# Patient Record
Sex: Female | Born: 1955 | Race: White | Hispanic: No | Smoking: Former smoker
Health system: Southern US, Community
[De-identification: ages and names within clinical notes are randomized; demographics above are authoritative.]

## PROBLEM LIST (undated history)

## (undated) DIAGNOSIS — K219 Gastro-esophageal reflux disease without esophagitis: Secondary | ICD-10-CM

## (undated) DIAGNOSIS — R011 Cardiac murmur, unspecified: Secondary | ICD-10-CM

## (undated) DIAGNOSIS — I509 Heart failure, unspecified: Secondary | ICD-10-CM

## (undated) DIAGNOSIS — K760 Fatty (change of) liver, not elsewhere classified: Secondary | ICD-10-CM

## (undated) DIAGNOSIS — D509 Iron deficiency anemia, unspecified: Secondary | ICD-10-CM

## (undated) DIAGNOSIS — D649 Anemia, unspecified: Secondary | ICD-10-CM

## (undated) DIAGNOSIS — I350 Nonrheumatic aortic (valve) stenosis: Secondary | ICD-10-CM

## (undated) DIAGNOSIS — Z86018 Personal history of other benign neoplasm: Secondary | ICD-10-CM

## (undated) DIAGNOSIS — M109 Gout, unspecified: Secondary | ICD-10-CM

## (undated) DIAGNOSIS — C801 Malignant (primary) neoplasm, unspecified: Secondary | ICD-10-CM

## (undated) DIAGNOSIS — F32A Depression, unspecified: Secondary | ICD-10-CM

## (undated) DIAGNOSIS — R296 Repeated falls: Secondary | ICD-10-CM

## (undated) DIAGNOSIS — Z9221 Personal history of antineoplastic chemotherapy: Secondary | ICD-10-CM

## (undated) DIAGNOSIS — R42 Dizziness and giddiness: Secondary | ICD-10-CM

## (undated) DIAGNOSIS — N281 Cyst of kidney, acquired: Secondary | ICD-10-CM

## (undated) DIAGNOSIS — E119 Type 2 diabetes mellitus without complications: Secondary | ICD-10-CM

## (undated) DIAGNOSIS — IMO0002 Reserved for concepts with insufficient information to code with codable children: Secondary | ICD-10-CM

## (undated) DIAGNOSIS — M1990 Unspecified osteoarthritis, unspecified site: Secondary | ICD-10-CM

## (undated) DIAGNOSIS — Z9289 Personal history of other medical treatment: Secondary | ICD-10-CM

## (undated) DIAGNOSIS — Q231 Congenital insufficiency of aortic valve: Secondary | ICD-10-CM

## (undated) DIAGNOSIS — Q2381 Bicuspid aortic valve: Secondary | ICD-10-CM

## (undated) DIAGNOSIS — N183 Chronic kidney disease, stage 3 unspecified: Secondary | ICD-10-CM

## (undated) DIAGNOSIS — I5032 Chronic diastolic (congestive) heart failure: Secondary | ICD-10-CM

## (undated) DIAGNOSIS — I10 Essential (primary) hypertension: Secondary | ICD-10-CM

## (undated) DIAGNOSIS — R9439 Abnormal result of other cardiovascular function study: Secondary | ICD-10-CM

## (undated) DIAGNOSIS — C189 Malignant neoplasm of colon, unspecified: Secondary | ICD-10-CM

## (undated) DIAGNOSIS — I471 Supraventricular tachycardia, unspecified: Secondary | ICD-10-CM

## (undated) DIAGNOSIS — C9000 Multiple myeloma not having achieved remission: Secondary | ICD-10-CM

## (undated) DIAGNOSIS — F419 Anxiety disorder, unspecified: Secondary | ICD-10-CM

## (undated) DIAGNOSIS — Z9481 Bone marrow transplant status: Secondary | ICD-10-CM

## (undated) DIAGNOSIS — I493 Ventricular premature depolarization: Secondary | ICD-10-CM

## (undated) DIAGNOSIS — Z9889 Other specified postprocedural states: Secondary | ICD-10-CM

## (undated) DIAGNOSIS — F329 Major depressive disorder, single episode, unspecified: Secondary | ICD-10-CM

## (undated) HISTORY — DX: Malignant (primary) neoplasm, unspecified: C80.1

## (undated) HISTORY — DX: Dizziness and giddiness: R42

## (undated) HISTORY — DX: Depression, unspecified: F32.A

## (undated) HISTORY — DX: Personal history of other medical treatment: Z92.89

## (undated) HISTORY — DX: Heart failure, unspecified: I50.9

## (undated) HISTORY — DX: Multiple myeloma not having achieved remission: C90.00

## (undated) HISTORY — DX: Fatty (change of) liver, not elsewhere classified: K76.0

## (undated) HISTORY — DX: Cardiac murmur, unspecified: R01.1

## (undated) HISTORY — DX: Repeated falls: R29.6

## (undated) HISTORY — DX: Anxiety disorder, unspecified: F41.9

## (undated) HISTORY — DX: Abnormal result of other cardiovascular function study: R94.39

## (undated) HISTORY — PX: CARPAL TUNNEL RELEASE: SHX101

## (undated) HISTORY — DX: Cyst of kidney, acquired: N28.1

## (undated) HISTORY — DX: Gout, unspecified: M10.9

## (undated) HISTORY — DX: Personal history of other benign neoplasm: Z86.018

## (undated) HISTORY — PX: CARDIAC ELECTROPHYSIOLOGY MAPPING AND ABLATION: SHX1292

## (undated) HISTORY — DX: Chronic kidney disease, stage 3 (moderate): N18.3

## (undated) HISTORY — DX: Congenital insufficiency of aortic valve: Q23.1

## (undated) HISTORY — DX: Malignant neoplasm of colon, unspecified: C18.9

## (undated) HISTORY — DX: Anemia, unspecified: D64.9

## (undated) HISTORY — PX: FOOT SURGERY: SHX648

## (undated) HISTORY — DX: Hypomagnesemia: E83.42

## (undated) HISTORY — DX: Bicuspid aortic valve: Q23.81

## (undated) HISTORY — DX: Bone marrow transplant status: Z94.81

## (undated) HISTORY — DX: Gastro-esophageal reflux disease without esophagitis: K21.9

## (undated) HISTORY — DX: Type 2 diabetes mellitus without complications: E11.9

## (undated) HISTORY — PX: ABDOMINAL HYSTERECTOMY: SHX81

## (undated) HISTORY — PX: INTERSTIM IMPLANT PLACEMENT: SHX5130

## (undated) HISTORY — DX: Major depressive disorder, single episode, unspecified: F32.9

## (undated) HISTORY — DX: Unspecified osteoarthritis, unspecified site: M19.90

## (undated) HISTORY — DX: Chronic kidney disease, stage 3 unspecified: N18.30

## (undated) HISTORY — DX: Reserved for concepts with insufficient information to code with codable children: IMO0002

## (undated) HISTORY — PX: OTHER SURGICAL HISTORY: SHX169

## (undated) MED FILL — Promethazine HCl Inj 25 MG/ML: INTRAMUSCULAR | Qty: 0.5 | Status: AC

---
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
## 1996-03-31 HISTORY — PX: PARTIAL HYSTERECTOMY: SHX80

## 2005-07-01 HISTORY — PX: TONSILLECTOMY: SUR1361

## 2006-07-01 HISTORY — PX: CHOLECYSTECTOMY: SHX55

## 2007-07-02 HISTORY — PX: INCONTINENCE SURGERY: SHX676

## 2013-01-14 DIAGNOSIS — R42 Dizziness and giddiness: Secondary | ICD-10-CM | POA: Insufficient documentation

## 2013-02-14 DIAGNOSIS — K529 Noninfective gastroenteritis and colitis, unspecified: Secondary | ICD-10-CM | POA: Insufficient documentation

## 2015-05-16 DIAGNOSIS — Z9484 Stem cells transplant status: Secondary | ICD-10-CM | POA: Insufficient documentation

## 2015-05-16 DIAGNOSIS — Z7682 Awaiting organ transplant status: Secondary | ICD-10-CM | POA: Insufficient documentation

## 2015-06-01 HISTORY — PX: OTHER SURGICAL HISTORY: SHX169

## 2015-07-06 DIAGNOSIS — Z9484 Stem cells transplant status: Secondary | ICD-10-CM | POA: Insufficient documentation

## 2016-02-02 DIAGNOSIS — R0602 Shortness of breath: Secondary | ICD-10-CM | POA: Insufficient documentation

## 2016-02-14 DIAGNOSIS — R9439 Abnormal result of other cardiovascular function study: Secondary | ICD-10-CM | POA: Insufficient documentation

## 2016-02-14 DIAGNOSIS — R072 Precordial pain: Secondary | ICD-10-CM | POA: Insufficient documentation

## 2016-02-14 HISTORY — DX: Abnormal result of other cardiovascular function study: R94.39

## 2016-04-02 ENCOUNTER — Inpatient Hospital Stay: Payer: 59 | Attending: Internal Medicine | Admitting: Internal Medicine

## 2016-04-02 ENCOUNTER — Other Ambulatory Visit: Payer: Self-pay | Admitting: *Deleted

## 2016-04-02 ENCOUNTER — Ambulatory Visit: Payer: 59

## 2016-04-02 ENCOUNTER — Encounter: Payer: Self-pay | Admitting: Internal Medicine

## 2016-04-02 VITALS — BP 146/76 | HR 64 | Temp 98.6°F | Resp 18 | Ht 63.0 in | Wt 138.9 lb

## 2016-04-02 DIAGNOSIS — M549 Dorsalgia, unspecified: Secondary | ICD-10-CM | POA: Diagnosis not present

## 2016-04-02 DIAGNOSIS — Z9181 History of falling: Secondary | ICD-10-CM | POA: Insufficient documentation

## 2016-04-02 DIAGNOSIS — Z87891 Personal history of nicotine dependence: Secondary | ICD-10-CM | POA: Diagnosis not present

## 2016-04-02 DIAGNOSIS — E119 Type 2 diabetes mellitus without complications: Secondary | ICD-10-CM | POA: Diagnosis not present

## 2016-04-02 DIAGNOSIS — K521 Toxic gastroenteritis and colitis: Secondary | ICD-10-CM

## 2016-04-02 DIAGNOSIS — Z9481 Bone marrow transplant status: Secondary | ICD-10-CM

## 2016-04-02 DIAGNOSIS — R197 Diarrhea, unspecified: Secondary | ICD-10-CM | POA: Diagnosis not present

## 2016-04-02 DIAGNOSIS — Z72820 Sleep deprivation: Secondary | ICD-10-CM

## 2016-04-02 DIAGNOSIS — F329 Major depressive disorder, single episode, unspecified: Secondary | ICD-10-CM | POA: Diagnosis not present

## 2016-04-02 DIAGNOSIS — R011 Cardiac murmur, unspecified: Secondary | ICD-10-CM | POA: Insufficient documentation

## 2016-04-02 DIAGNOSIS — M109 Gout, unspecified: Secondary | ICD-10-CM | POA: Insufficient documentation

## 2016-04-02 DIAGNOSIS — Z7984 Long term (current) use of oral hypoglycemic drugs: Secondary | ICD-10-CM | POA: Diagnosis not present

## 2016-04-02 DIAGNOSIS — G8929 Other chronic pain: Secondary | ICD-10-CM

## 2016-04-02 DIAGNOSIS — M129 Arthropathy, unspecified: Secondary | ICD-10-CM | POA: Diagnosis not present

## 2016-04-02 DIAGNOSIS — C9001 Multiple myeloma in remission: Secondary | ICD-10-CM | POA: Diagnosis present

## 2016-04-02 DIAGNOSIS — K219 Gastro-esophageal reflux disease without esophagitis: Secondary | ICD-10-CM | POA: Diagnosis not present

## 2016-04-02 DIAGNOSIS — C9000 Multiple myeloma not having achieved remission: Secondary | ICD-10-CM | POA: Insufficient documentation

## 2016-04-02 DIAGNOSIS — Z800 Family history of malignant neoplasm of digestive organs: Secondary | ICD-10-CM

## 2016-04-02 DIAGNOSIS — F419 Anxiety disorder, unspecified: Secondary | ICD-10-CM | POA: Insufficient documentation

## 2016-04-02 DIAGNOSIS — Z808 Family history of malignant neoplasm of other organs or systems: Secondary | ICD-10-CM | POA: Insufficient documentation

## 2016-04-02 DIAGNOSIS — G4700 Insomnia, unspecified: Secondary | ICD-10-CM | POA: Diagnosis not present

## 2016-04-02 DIAGNOSIS — K760 Fatty (change of) liver, not elsewhere classified: Secondary | ICD-10-CM | POA: Insufficient documentation

## 2016-04-02 DIAGNOSIS — N281 Cyst of kidney, acquired: Secondary | ICD-10-CM | POA: Diagnosis not present

## 2016-04-02 DIAGNOSIS — Z79899 Other long term (current) drug therapy: Secondary | ICD-10-CM | POA: Diagnosis not present

## 2016-04-02 DIAGNOSIS — R42 Dizziness and giddiness: Secondary | ICD-10-CM | POA: Diagnosis not present

## 2016-04-02 DIAGNOSIS — R200 Anesthesia of skin: Secondary | ICD-10-CM | POA: Diagnosis not present

## 2016-04-02 DIAGNOSIS — I129 Hypertensive chronic kidney disease with stage 1 through stage 4 chronic kidney disease, or unspecified chronic kidney disease: Secondary | ICD-10-CM | POA: Insufficient documentation

## 2016-04-02 DIAGNOSIS — T451X5A Adverse effect of antineoplastic and immunosuppressive drugs, initial encounter: Secondary | ICD-10-CM

## 2016-04-02 DIAGNOSIS — Z803 Family history of malignant neoplasm of breast: Secondary | ICD-10-CM

## 2016-04-02 DIAGNOSIS — F32A Depression, unspecified: Secondary | ICD-10-CM

## 2016-04-02 DIAGNOSIS — N183 Chronic kidney disease, stage 3 (moderate): Secondary | ICD-10-CM | POA: Diagnosis not present

## 2016-04-02 LAB — CBC WITH DIFFERENTIAL/PLATELET
BASOS ABS: 0 10*3/uL (ref 0–0.1)
Basophils Relative: 0 %
Eosinophils Absolute: 0.1 10*3/uL (ref 0–0.7)
Eosinophils Relative: 2 %
HEMATOCRIT: 32 % — AB (ref 35.0–47.0)
Hemoglobin: 11.2 g/dL — ABNORMAL LOW (ref 12.0–16.0)
LYMPHS PCT: 25 %
Lymphs Abs: 1.3 10*3/uL (ref 1.0–3.6)
MCH: 31.9 pg (ref 26.0–34.0)
MCHC: 34.9 g/dL (ref 32.0–36.0)
MCV: 91.3 fL (ref 80.0–100.0)
MONO ABS: 0.3 10*3/uL (ref 0.2–0.9)
Monocytes Relative: 6 %
NEUTROS ABS: 3.6 10*3/uL (ref 1.4–6.5)
Neutrophils Relative %: 67 %
Platelets: 94 10*3/uL — ABNORMAL LOW (ref 150–440)
RBC: 3.5 MIL/uL — ABNORMAL LOW (ref 3.80–5.20)
RDW: 15.7 % — AB (ref 11.5–14.5)
WBC: 5.4 10*3/uL (ref 3.6–11.0)

## 2016-04-02 LAB — COMPREHENSIVE METABOLIC PANEL
ALK PHOS: 91 U/L (ref 38–126)
ALT: 26 U/L (ref 14–54)
AST: 30 U/L (ref 15–41)
Albumin: 4.7 g/dL (ref 3.5–5.0)
Anion gap: 9 (ref 5–15)
BILIRUBIN TOTAL: 0.6 mg/dL (ref 0.3–1.2)
BUN: 18 mg/dL (ref 6–20)
CALCIUM: 8.5 mg/dL — AB (ref 8.9–10.3)
CO2: 24 mmol/L (ref 22–32)
CREATININE: 0.91 mg/dL (ref 0.44–1.00)
Chloride: 107 mmol/L (ref 101–111)
GFR calc Af Amer: >60 mL/min (ref >60)
Glucose, Bld: 83 mg/dL (ref 65–99)
POTASSIUM: 3.6 mmol/L (ref 3.5–5.1)
Sodium: 140 mmol/L (ref 135–145)
TOTAL PROTEIN: 7.5 g/dL (ref 6.5–8.1)

## 2016-04-02 LAB — LACTATE DEHYDROGENASE: LDH: 165 U/L (ref 98–192)

## 2016-04-02 MED ORDER — GLIPIZIDE ER 2.5 MG PO TB24: 2.5000 mg | ORAL_TABLET | Freq: Every day | ORAL | 0 refills | Status: DC

## 2016-04-02 MED ORDER — ALPRAZOLAM 1 MG PO TABS: 1.0000 mg | ORAL_TABLET | Freq: Two times a day (BID) | ORAL | 0 refills | Status: DC | PRN

## 2016-04-02 MED ORDER — DIPHENOXYLATE-ATROPINE 2.5-0.025 MG PO TABS: 2.0000 | ORAL_TABLET | Freq: Four times a day (QID) | ORAL | 3 refills | Status: DC | PRN

## 2016-04-02 MED ORDER — REVLIMID 5 MG PO CAPS: 5.0000 mg | ORAL_CAPSULE | Freq: Every day | ORAL | 4 refills | Status: DC

## 2016-04-02 MED ORDER — HYDROCODONE-IBUPROFEN 7.5-200 MG PO TABS: 1.0000 | ORAL_TABLET | Freq: Three times a day (TID) | ORAL | 0 refills | Status: DC | PRN

## 2016-04-02 MED ORDER — ZOLPIDEM TARTRATE ER 12.5 MG PO TBCR: 12.5000 mg | EXTENDED_RELEASE_TABLET | Freq: Every day | ORAL | 1 refills | Status: DC

## 2016-04-02 NOTE — Progress Notes (Signed)
[facility] CONSULT NOTE  No care team member to display  CHIEF COMPLAINTS/PURPOSE OF CONSULTATION:  Multiple myeloma  # MULTIPLE MYELOMA-   Oncology History   # June 2017- IgALamda MULTIPLE MYELOMA [BMBx- 80% plasma cells; [facility]]; Lamda light chain 1340; s/p RVD   # 14th DEC 2016- Auto-Stem cell transplant [[facility];Dr.Hertzig]; rev [dose reduced sec to [place]  # Maintenance Revlimid- 83m 3 w-ON & 1 week OFF  # Bone lesions-numerous ~548mlytic lesions in Thoracic spine- on Zometa  # June 2016- Proteinuria [3gm/day]/ CKD III   # chronic back pain/ anxiety/ PN   # "Colitis" s/p multiple EGD/Colonoscopies     Multiple myeloma in remission ([facility]  04/02/2016 Initial Diagnosis    [place]oma in remission ([facility]      HISTORY OF PRESENTING ILLNESS:  ChMaisley Hainsworth89.o.  female of her history of multiple myeloma diagnosed June 2016 currently on maintenance Revlimid after bone marrow transplant is here to establish care.   Patient denies any unusual weight loss. Patient has chronic back pain multiple pain. This is not any worse. She has anxiety for which she is on Xanax.  She has chronic diarrhea for which she has been taking Lomotil as needed. This is attributed to Revlimid. Although she has chronic probable irritable bowel syndrome. Chronic tingling and numbness of extremities.  ROS: A complete 10 point review of system is done which is negative except mentioned above in history of present illness  MEDICAL HISTORY:  Past Medical History:  Diagnosis Date  . Anemia   . Anxiety   . Arthritis   . Bicuspid aortic valve   . CKD (chronic kidney disease) stage 3, GFR 30-59 ml/min   . Depression   . Diabetes mellitus ([facility]  . Dizziness   . Fatty liver   . Frequent falls   . GERD (gastroesophageal reflux disease)   . Gout   . Heart murmur   . History of blood transfusion   . History of bone marrow  transplant ([facility]  . History of uterine fibroid   . Hypomagnesemia   . Multiple myeloma ([facility]  . Renal cyst     SURGICAL HISTORY: Past Surgical History:  Procedure Laterality Date  . CARDIAC ELECTROPHYSIOLOGY MAPPING AND ABLATION    . CARPAL TUNNEL RELEASE Bilateral   . CHOLECYSTECTOMY  2008  . FOOT SURGERY Bilateral   . HARVEST BONE MARROW    . INCONTINENCE SURGERY  2009  . PARTIAL HYSTERECTOMY  03/1996  . TONSILLECTOMY  2007    [place]; quit smoking 93; occasional alcohol; lives [place] with step sister.  Social History   Social History  . Marital status: Widowed    Spouse name: N/A  . Number of children: N/A  . Years of education: N/A   Occupational History  . Not on file.   Social History Main Topics  . Smoking status: Former Smoker    Packs/day: 1.00    Years: 20.00    Types: Cigarettes    Quit date: 07/02/1991  . Smokeless tobacco: Never Used  . Alcohol use 1.2 oz/week    1 Glasses of wine, 1 Cans of beer per week     Comment: occassional  . Drug use: No  . Sexual activity: No   Other Topics Concern  . Not on file   Social History Narrative  . No narrative on file  FAMILY HISTORY: Family History  Problem Relation Age of Onset  . Colon cancer Father   . Renal Disease Father   . Melanoma Paternal Grandmother   . Breast cancer Maternal Aunt   . Anemia Mother   . Heart disease Mother   . Heart failure Mother   . Renal Disease Mother   . Diabetes Mellitus II Sister   . Heart disease Maternal Uncle   . Throat cancer Maternal Uncle   . Lung cancer Maternal Uncle   . Liver disease Maternal Uncle   . Heart failure Maternal Uncle     ALLERGIES:  is allergic to zofran [ondansetron hcl]; benadryl [diphenhydramine]; morphine and related; and tylenol [acetaminophen].  MEDICATIONS:  Current Outpatient Prescriptions  Medication Sig Dispense Refill  . acyclovir (ZOVIRAX) 200 MG capsule Take 1 capsule by  mouth daily.    Marland Kitchen allopurinol (ZYLOPRIM) 100 MG tablet Take 1 tablet by mouth daily as needed. gout    . bisoprolol (ZEBETA) 10 MG tablet Take 1 tablet by mouth daily.    . Calcium Carbonate-Vitamin D (CALTRATE 600+D) 600-400 MG-UNIT tablet Take 1 tablet by mouth daily.    . diphenoxylate-atropine (LOMOTIL) 2.5-0.025 MG tablet Take 2 tablets by mouth 4 (four) times daily as needed for diarrhea or loose stools. 30 tablet 3  . DULoxetine (CYMBALTA) 60 MG capsule Take 1 capsule by mouth 2 (two) times daily.    . hydrOXYzine (ATARAX/VISTARIL) 10 MG tablet Take 10 mg by mouth 3 (three) times daily as needed.    . loperamide (IMODIUM A-D) 2 MG tablet Take 2 mg by mouth 4 (four) times daily as needed for diarrhea or loose stools.    . montelukast (SINGULAIR) 10 MG tablet Take 1 tablet by mouth daily.    . Multiple Vitamins-Minerals (HAIR/SKIN/NAILS/BIOTIN PO) Take 1 capsule by mouth 3 (three) times daily.    Marland Kitchen omeprazole (PRILOSEC) 40 MG capsule Take 1 capsule by mouth daily.    . promethazine (PHENERGAN) 25 MG tablet Take 1 tablet by mouth every 6 (six) hours as needed. anxiety    . REVLIMID 5 MG capsule Take 1 capsule (5 mg total) by mouth daily. X 21 days.  1 week off 21 capsule 4  . tiZANidine (ZANAFLEX) 4 MG tablet Take 1 tablet by mouth 3 (three) times daily as needed. Muscle relaxer for back pain    . zolpidem (AMBIEN CR) 12.5 MG CR tablet Take 1 tablet (12.5 mg total) by mouth at bedtime. 30 tablet 1  . ALPRAZolam (XANAX) 1 MG tablet Take 1 tablet (1 mg total) by mouth 2 (two) times daily as needed for anxiety. 30 tablet 0  . glipiZIDE (GLUCOTROL XL) 2.5 MG 24 hr tablet Take 1 tablet (2.5 mg total) by mouth daily with breakfast. 30 tablet 0  . HYDROcodone-ibuprofen (VICOPROFEN) 7.5-200 MG tablet Take 1 tablet by mouth every 8 (eight) hours as needed. Pain 30 tablet 0  . polyethylene glycol (MIRALAX / GLYCOLAX) packet Take 17 g by mouth daily as needed for moderate constipation.     No current  facility-administered medications for this visit.       Marland Kitchen  PHYSICAL EXAMINATION: ECOG PERFORMANCE STATUS: 1 - Symptomatic but completely ambulatory  Vitals:   04/02/16 1429 04/02/16 1505  BP: (!) 151/77 (!) 146/76  Pulse: 67 64  Resp: 18   Temp: 98.6 F (37 C)    Filed Weights   04/02/16 1429  Weight: 138 lb 14.2 oz (63 kg)    GENERAL: Well-nourished well-developed;  Alert, no distress and comfortable.  Accompanied by her sister EYES: no pallor or icterus OROPHARYNX: no thrush or ulceration; good dentition  NECK: supple, no masses felt LYMPH:  no palpable lymphadenopathy in the cervical, axillary or inguinal regions LUNGS: clear to auscultation and  No wheeze or crackles HEART/CVS: regular rate & rhythm and no murmurs; No lower extremity edema ABDOMEN: abdomen soft, non-tender and normal bowel sounds Musculoskeletal:no cyanosis of digits and no clubbing  PSYCH: alert & oriented x 3 with fluent speech NEURO: no focal motor/sensory deficits SKIN:  no rashes or significant lesions  LABORATORY DATA:  I have reviewed the data as listed Lab Results  Component Value Date   WBC 5.4 04/02/2016   HGB 11.2 (L) 04/02/2016   HCT 32.0 (L) 04/02/2016   MCV 91.3 04/02/2016   PLT 94 (L) 04/02/2016    Recent Labs  04/02/16 1603  NA 140  K 3.6  CL 107  CO2 24  GLUCOSE 83  BUN 18  CREATININE 0.91  CALCIUM 8.5*  GFRNONAA >60  GFRAA >60  PROT 7.5  ALBUMIN 4.7  AST 30  ALT 26  ALKPHOS 91  BILITOT 0.6    RADIOGRAPHIC STUDIES: I have personally reviewed the radiological images as listed and agreed with the findings in the report. No results found.  ASSESSMENT & PLAN:   Multiple myeloma in remission ([place]) Multiple myeloma IgA lambda as per the records status post autologous stem cell transplant in December 2016 currently on Revlimid. Clinically no evidence of progression at this time.  # Recommend checking baseline monoclonal workup lambda light chain workup schedule  survey CBC CMP.  # Bone lesions recommend Zometa every 4 weeks.  # Anxiety/insomnia- recommend continued Xanax Ambien  # Chronic back pain-continue current pain medication/patient may benefit from pain management evaluation future.  # Patient received back approximately one week/to review the labs Zometa.   # 60 minutes face-to-face with the patient discussing the above plan of care; more than 50% of time spent on prognosis/ natural history; counseling and coordination.  All questions were answered. The patient knows to call the clinic with any problems, questions or concerns.   Cammie Sickle, MD 04/02/2016 5:23 PM

## 2016-04-02 NOTE — Progress Notes (Signed)
Revlimid 5 mg takes for 21 days on and 1 week off.  Uses [employer].  She has two more weeks of this drug.  Week off would be 04/22/16.  Reports that this was a dose reduction.  Patient has frequent 3-5 loose stools uses the imodium + lomotil.    Pt was dose reduced r/t to rash and diarrhea.  Stool has loose stools, but this has improved since dose reduced.  The treatment was held x 1 month before dose reduce.  "I take my vicoprofen to help me reduce the amt of stools b/c this drug can cause constipation"  Patient had been h/o tx with velcade, steriods, and revlimid Until Nov. 2016.  Had a bone marrow transplant in December 2016.  She c/o frequent episodes of nausea. Resolved with Phenergan - unable to tolerate zofran due to diarrhea.  Reports - Numbness and tingling in toes and right foot unknown-cause. Pt unable to determine if this is r/t revlimid or r/t to h/o falls.  Reports stiffness in left leg. Left leg drags when ambulation. Reports falling 5 times in the last year.  Temp 99.4 this am  Pt has reported anxiety (taking xanax) /depression (taking cymbalta). Husband recently died in 09/03/15. Pt states that she is having "trouble sleeping due to pain and generalized anxiety.  Pt requesting refill on Xanax.  "i don't feel like the 0.5 is strong enough). I am also taking Ambien 12.5  ER. That is not working- pt also took regular dosing of Ambien and this did not help as well in the past.

## 2016-04-02 NOTE — Assessment & Plan Note (Signed)
Multiple myeloma IgA lambda as per the records status post autologous stem cell transplant in December 2016 currently on Revlimid. Clinically no evidence of progression at this time.  # Recommend checking baseline monoclonal workup lambda light chain workup schedule survey CBC CMP.  # Bone lesions recommend Zometa every 4 weeks.  # Anxiety/insomnia- recommend continued Xanax Ambien  # Chronic back pain-continue current pain medication/patient may benefit from pain management evaluation future.  # Patient received back approximately one week/to review the labs Zometa.   # 60 minutes face-to-face with the patient discussing the above plan of care; more than 50% of time spent on prognosis/ natural history; counseling and coordination.

## 2016-04-03 LAB — KAPPA/LAMBDA LIGHT CHAINS
KAPPA FREE LGHT CHN: 21.3 mg/L — AB (ref 3.3–19.4)
Kappa, lambda light chain ratio: 1.4 (ref 0.26–1.65)
Lambda free light chains: 15.2 mg/L (ref 5.7–26.3)

## 2016-04-04 ENCOUNTER — Telehealth: Payer: Self-pay | Admitting: *Deleted

## 2016-04-04 LAB — MULTIPLE MYELOMA PANEL, SERUM
ALBUMIN/GLOB SERPL: 1.9 — AB (ref 0.7–1.7)
ALPHA 1: 0.2 g/dL (ref 0.0–0.4)
Albumin SerPl Elph-Mcnc: 4.5 g/dL — ABNORMAL HIGH (ref 2.9–4.4)
Alpha2 Glob SerPl Elph-Mcnc: 0.8 g/dL (ref 0.4–1.0)
B-Globulin SerPl Elph-Mcnc: 0.9 g/dL (ref 0.7–1.3)
Gamma Glob SerPl Elph-Mcnc: 0.6 g/dL (ref 0.4–1.8)
Globulin, Total: 2.5 g/dL (ref 2.2–3.9)
IGA: 216 mg/dL (ref 87–352)
IGG (IMMUNOGLOBIN G), SERUM: 713 mg/dL (ref 700–1600)
IGM, SERUM: 25 mg/dL — AB (ref 26–217)
TOTAL PROTEIN ELP: 7 g/dL (ref 6.0–8.5)

## 2016-04-04 NOTE — Telephone Encounter (Signed)
Contacted [employer] to attempt prescriber survey. Patient not under Dr. Lucrezia Europe dashboard in revassist. Unable to add patient to system at this time due to patient getting recent refill of revlimid.  Patient can not be added to system until 2 more weeks.  At that time, prescriber survey can be performed.  (patient's id  Number to pull up patient   [phone]

## 2016-04-08 ENCOUNTER — Ambulatory Visit
Admission: RE | Admit: 2016-04-08 | Discharge: 2016-04-08 | Disposition: A | Discharge status: Home / Self Care | Payer: 59 | Source: Ambulatory Visit | Attending: Internal Medicine | Admitting: Internal Medicine

## 2016-04-08 ENCOUNTER — Encounter: Payer: Self-pay | Admitting: Radiology

## 2016-04-08 DIAGNOSIS — C9001 Multiple myeloma in remission: Secondary | ICD-10-CM

## 2016-04-09 ENCOUNTER — Inpatient Hospital Stay: Payer: 59

## 2016-04-09 ENCOUNTER — Other Ambulatory Visit: Payer: Self-pay | Admitting: Internal Medicine

## 2016-04-09 ENCOUNTER — Encounter: Payer: Self-pay | Admitting: Internal Medicine

## 2016-04-09 ENCOUNTER — Inpatient Hospital Stay (HOSPITAL_BASED_OUTPATIENT_CLINIC_OR_DEPARTMENT_OTHER): Payer: 59 | Admitting: Internal Medicine

## 2016-04-09 VITALS — BP 121/76 | HR 76 | Temp 95.5°F | Resp 17 | Ht 63.0 in | Wt 144.0 lb

## 2016-04-09 DIAGNOSIS — M549 Dorsalgia, unspecified: Secondary | ICD-10-CM

## 2016-04-09 DIAGNOSIS — I129 Hypertensive chronic kidney disease with stage 1 through stage 4 chronic kidney disease, or unspecified chronic kidney disease: Secondary | ICD-10-CM

## 2016-04-09 DIAGNOSIS — E119 Type 2 diabetes mellitus without complications: Secondary | ICD-10-CM

## 2016-04-09 DIAGNOSIS — Z79899 Other long term (current) drug therapy: Secondary | ICD-10-CM

## 2016-04-09 DIAGNOSIS — R011 Cardiac murmur, unspecified: Secondary | ICD-10-CM

## 2016-04-09 DIAGNOSIS — C9001 Multiple myeloma in remission: Secondary | ICD-10-CM

## 2016-04-09 DIAGNOSIS — Z7984 Long term (current) use of oral hypoglycemic drugs: Secondary | ICD-10-CM

## 2016-04-09 DIAGNOSIS — K760 Fatty (change of) liver, not elsewhere classified: Secondary | ICD-10-CM

## 2016-04-09 DIAGNOSIS — M129 Arthropathy, unspecified: Secondary | ICD-10-CM

## 2016-04-09 DIAGNOSIS — M109 Gout, unspecified: Secondary | ICD-10-CM

## 2016-04-09 DIAGNOSIS — N183 Chronic kidney disease, stage 3 (moderate): Secondary | ICD-10-CM

## 2016-04-09 DIAGNOSIS — F329 Major depressive disorder, single episode, unspecified: Secondary | ICD-10-CM

## 2016-04-09 DIAGNOSIS — R197 Diarrhea, unspecified: Secondary | ICD-10-CM

## 2016-04-09 DIAGNOSIS — G4700 Insomnia, unspecified: Secondary | ICD-10-CM

## 2016-04-09 DIAGNOSIS — Z808 Family history of malignant neoplasm of other organs or systems: Secondary | ICD-10-CM

## 2016-04-09 DIAGNOSIS — R200 Anesthesia of skin: Secondary | ICD-10-CM

## 2016-04-09 DIAGNOSIS — F419 Anxiety disorder, unspecified: Secondary | ICD-10-CM

## 2016-04-09 DIAGNOSIS — K219 Gastro-esophageal reflux disease without esophagitis: Secondary | ICD-10-CM

## 2016-04-09 DIAGNOSIS — Z87891 Personal history of nicotine dependence: Secondary | ICD-10-CM

## 2016-04-09 DIAGNOSIS — Z9481 Bone marrow transplant status: Secondary | ICD-10-CM

## 2016-04-09 DIAGNOSIS — Z9181 History of falling: Secondary | ICD-10-CM

## 2016-04-09 DIAGNOSIS — Z803 Family history of malignant neoplasm of breast: Secondary | ICD-10-CM

## 2016-04-09 DIAGNOSIS — G8929 Other chronic pain: Secondary | ICD-10-CM | POA: Diagnosis not present

## 2016-04-09 DIAGNOSIS — R42 Dizziness and giddiness: Secondary | ICD-10-CM

## 2016-04-09 DIAGNOSIS — Z800 Family history of malignant neoplasm of digestive organs: Secondary | ICD-10-CM

## 2016-04-09 DIAGNOSIS — N281 Cyst of kidney, acquired: Secondary | ICD-10-CM

## 2016-04-09 MED ORDER — ZOLEDRONIC ACID 4 MG/100ML IV SOLN
4.0000 mg | Freq: Once | INTRAVENOUS | Status: AC
  Administered 2016-04-09: 4 mg via INTRAVENOUS
  Filled 2016-04-09: qty 100

## 2016-04-09 MED ORDER — OMEPRAZOLE 40 MG PO CPDR: 40.0000 mg | DELAYED_RELEASE_CAPSULE | Freq: Two times a day (BID) | ORAL | 6 refills | Status: DC

## 2016-04-09 MED ORDER — SODIUM CHLORIDE 0.9 % IV SOLN
Freq: Once | INTRAVENOUS | Status: AC
  Administered 2016-04-09: 1 mL via INTRAVENOUS
  Filled 2016-04-09: qty 1000

## 2016-04-09 NOTE — Progress Notes (Signed)
[facility] NOTE  Patient Care Team: Sarah Sickle, MD as PCP - General (Internal Medicine)  CHIEF COMPLAINTS/PURPOSE OF CONSULTATION:  Multiple myeloma  # MULTIPLE MYELOMA-   Oncology History   # June 2017- IgALamda MULTIPLE MYELOMA [BMBx- 80% plasma cells; [facility]]; Lamda light chain 1340; s/p RVD   # 14th DEC 2016- Auto-Stem cell transplant [[facility];Dr.Hertzig]; rev [dose reduced sec to [place]  # Maintenance Revlimid- 30m 3 w-ON & 1 week OFF  # Bone lesions-numerous ~541mlytic lesions in Thoracic spine- on Zometa  # June 2016- Proteinuria [3gm/day]/ CKD III   # chronic back pain/ anxiety/ PN   # "Colitis" s/p multiple EGD/Colonoscopies     Multiple myeloma in remission ([name]  04/02/2016 Initial Diagnosis    Multiple [facility]oma in remission ([name]      HISTORY OF PRESENTING ILLNESS:  Sarah Poch89.o.  female of her history of multiple myeloma diagnosed June 2016 currently on maintenance Revlimid after bone marrow transplant Is here for follow-up.  Patient denies any symptoms last visit. She continues to chronic back pain. Chronic anxiety issues. Not any better. No bleeding.  She has chronic diarrhea for which she has been taking Lomotil as needed. This is attributed to Revlimid. Although she has chronic probable irritable bowel syndrome. Chronic tingling and numbness of extremities.  ROS: A complete 10 point review of system is done which is negative except mentioned above in history of present illness  MEDICAL HISTORY:  Past Medical History:  Diagnosis Date  . Anemia   . Anxiety   . Arthritis   . Bicuspid aortic valve   . CKD (chronic kidney disease) stage 3, GFR 30-59 ml/min   . Depression   . Diabetes mellitus ([facility]  . Dizziness   . Fatty liver   . Frequent falls   . GERD (gastroesophageal reflux disease)   . Gout   . Heart murmur   . History of blood transfusion   . History  of bone marrow transplant ([facility] [place]  . History of uterine fibroid   . Hypomagnesemia   . Multiple myeloma (HCAn[facility]  . Renal cyst     SURGICAL HISTORY: Past Surgical History:  Procedure Laterality Date  . CARDIAC ELECTROPHYSIOLOGY MAPPING AND ABLATION    . CARPAL TUNNEL RELEASE Bilateral   . CHOLECYSTECTOMY  2008  . FOOT SURGERY Bilateral   . HARVEST BONE MARROW    . INCONTINENCE SURGERY  2009  . PARTIAL HYSTERECTOMY  03/1996  . TONSILLECTOMY  2007    [place]; quit smoking 93; occasional alcohol; lives [place] with step sister.  Social History   Social History  . Marital status: Widowed    Spouse name: N/A  . Number of children: N/A  . Years of education: N/A   Occupational History  . Not on file.   Social History Main Topics  . Smoking status: Former Smoker    Packs/day: 1.00    Years: 20.00    Types: Cigarettes    Quit date: 07/02/1991  . Smokeless tobacco: Never Used  . Alcohol use 1.2 oz/week    1 Glasses of wine, 1 Cans of beer per week     Comment: occassional  . Drug use: No  . Sexual activity: No   Other Topics Concern  . Not on file   Social History Narrative  . No narrative on file  FAMILY HISTORY: Family History  Problem Relation Age of Onset  . Colon cancer Father   . Renal Disease Father   . Melanoma Paternal Grandmother   . Breast cancer Maternal Aunt   . Anemia Mother   . Heart disease Mother   . Heart failure Mother   . Renal Disease Mother   . Diabetes Mellitus II Sister   . Heart disease Maternal Uncle   . Throat cancer Maternal Uncle   . Lung cancer Maternal Uncle   . Liver disease Maternal Uncle   . Heart failure Maternal Uncle     ALLERGIES:  is allergic to zofran [ondansetron hcl]; benadryl [diphenhydramine]; morphine and related; and tylenol [acetaminophen].  MEDICATIONS:  Current Outpatient Prescriptions  Medication Sig Dispense Refill  . acyclovir (ZOVIRAX) 200 MG capsule  Take 1 capsule by mouth daily.    Marland Kitchen allopurinol (ZYLOPRIM) 100 MG tablet Take 1 tablet by mouth daily as needed. gout    . ALPRAZolam (XANAX) 1 MG tablet Take 1 tablet (1 mg total) by mouth 2 (two) times daily as needed for anxiety. 30 tablet 0  . bisoprolol (ZEBETA) 10 MG tablet Take 1 tablet by mouth daily.    . Calcium Carbonate-Vitamin D (CALTRATE 600+D) 600-400 MG-UNIT tablet Take 1 tablet by mouth daily.    . diphenoxylate-atropine (LOMOTIL) 2.5-0.025 MG tablet Take 2 tablets by mouth 4 (four) times daily as needed for diarrhea or loose stools. 30 tablet 3  . DULoxetine (CYMBALTA) 60 MG capsule Take 1 capsule by mouth 2 (two) times daily.    Marland Kitchen glipiZIDE (GLUCOTROL XL) 2.5 MG 24 hr tablet Take 1 tablet (2.5 mg total) by mouth daily with breakfast. 30 tablet 0  . HYDROcodone-ibuprofen (VICOPROFEN) 7.5-200 MG tablet Take 1 tablet by mouth every 8 (eight) hours as needed. Pain 30 tablet 0  . hydrOXYzine (ATARAX/VISTARIL) 10 MG tablet Take 10 mg by mouth 3 (three) times daily as needed.    . loperamide (IMODIUM A-D) 2 MG tablet Take 2 mg by mouth 4 (four) times daily as needed for diarrhea or loose stools.    . montelukast (SINGULAIR) 10 MG tablet Take 1 tablet by mouth daily.    . Multiple Vitamins-Minerals (HAIR/SKIN/NAILS/BIOTIN PO) Take 1 capsule by mouth 3 (three) times daily.    Marland Kitchen omeprazole (PRILOSEC) 40 MG capsule Take 1 capsule (40 mg total) by mouth 2 (two) times daily. 60 capsule 6  . polyethylene glycol (MIRALAX / GLYCOLAX) packet Take 17 g by mouth daily as needed for moderate constipation.    . promethazine (PHENERGAN) 25 MG tablet Take 1 tablet by mouth every 6 (six) hours as needed. anxiety    . REVLIMID 5 MG capsule Take 1 capsule (5 mg total) by mouth daily. X 21 days.  1 week off 21 capsule 4  . tiZANidine (ZANAFLEX) 4 MG tablet Take 1 tablet by mouth 3 (three) times daily as needed. Muscle relaxer for back pain    . zolpidem (AMBIEN CR) 12.5 MG CR tablet Take 1 tablet (12.5 mg  total) by mouth at bedtime. 30 tablet 1   No current facility-administered medications for this visit.       Marland Kitchen  PHYSICAL EXAMINATION: ECOG PERFORMANCE STATUS: 1 - Symptomatic but completely ambulatory  Vitals:   04/09/16 1057  BP: 121/76  Pulse: 76  Resp: 17  Temp: (!) 95.5 F (35.3 C)   Filed Weights   04/09/16 1057  Weight: 143 lb 15.4 oz (65.3 kg)    GENERAL: Well-nourished well-developed;  Alert, no distress and comfortable.  EYES: no pallor or icterus OROPHARYNX: no thrush or ulceration; good dentition  NECK: supple, no masses felt LYMPH:  no palpable lymphadenopathy in the cervical, axillary or inguinal regions LUNGS: clear to auscultation and  No wheeze or crackles HEART/CVS: regular rate & rhythm and no murmurs; No lower extremity edema ABDOMEN: abdomen soft, non-tender and normal bowel sounds Musculoskeletal:no cyanosis of digits and no clubbing  PSYCH: alert & oriented x 3 with fluent speech NEURO: no focal motor/sensory deficits SKIN:  no rashes or significant lesions  LABORATORY DATA:  I have reviewed the data as listed Lab Results  Component Value Date   WBC 5.4 04/02/2016   HGB 11.2 (L) 04/02/2016   HCT 32.0 (L) 04/02/2016   MCV 91.3 04/02/2016   PLT 94 (L) 04/02/2016    Recent Labs  04/02/16 1603  NA 140  K 3.6  CL 107  CO2 24  GLUCOSE 83  BUN 18  CREATININE 0.91  CALCIUM 8.5*  GFRNONAA >60  GFRAA >60  PROT 7.5  ALBUMIN 4.7  AST 30  ALT 26  ALKPHOS 91  BILITOT 0.6    RADIOGRAPHIC STUDIES: I have personally reviewed the radiological images as listed and agreed with the findings in the report. Dg Bone Survey Met  Result Date: 04/08/2016 CLINICAL DATA:  Pt recently moved here and is following up fro dx of mult myeloma 1 year ago. Body aches but no specific area. Mult falls over past 6 months due to left leg drags EXAM: METASTATIC BONE SURVEY COMPARISON:  None. FINDINGS: Imaging of the skull, spine, chest, pelvis and extremities was  performed. There are no lytic bone lesions to suggest multiple myeloma. There are no osteoblastic lesions. IMPRESSION: No lytic bone lesions are seen to suggest multiple myeloma. Electronically Signed   By: Lajean Manes M.D.   On: 04/08/2016 16:57    ASSESSMENT & PLAN:   Multiple myeloma in remission ([place]) Multiple myeloma IgA lambda as per the records status post autologous stem cell transplant in December 2016 currently on Revlimid. Clinically no evidence of progression at this time. M- protein/ K-L=Normal. Skeletal survey- Normal.   # Chronic mild-mod 92/ ? Revlimid vs others. Monitor for now.   # Bone lesions recommend Zometa every 4 weeks; re-start today. Ca- 8.5 today. Ca+ vit D BID.   # Anxiety/insomnia- Continued Xanax Ambien  # Chronic back pain-continue current pain medication/patient may benefit from pain management evaluation future.  # refer to BMT locally for follow up/pts preference is [facility].   # follow up in 4 weeks/ zometa-labs; 8 weeks/labs/zometa/MD.     Sarah Sickle, MD 04/09/2016 5:38 PM

## 2016-04-09 NOTE — Assessment & Plan Note (Addendum)
Multiple myeloma IgA lambda as per the records status post autologous stem cell transplant in December 2016 currently on Revlimid. Clinically no evidence of progression at this time. M- protein/ K-L=Normal. Skeletal survey- Normal.   # Chronic mild-mod 92/ ? Revlimid vs others. Monitor for now.   # Bone lesions recommend Zometa every 4 weeks; re-start today. Ca- 8.5 today. Ca+ vit D BID.   # Anxiety/insomnia- Continued Xanax Ambien  # Chronic back pain-continue current pain medication/patient may benefit from pain management evaluation future.  # refer to BMT locally for follow up/pts preference is [facility].   # follow up in 4 weeks/ zometa-labs; 8 weeks/labs/zometa/MD.

## 2016-04-09 NOTE — Progress Notes (Signed)
Concerned that she is having more heartburn and is on medication.  Recent whole body bone scan yesterday

## 2016-04-10 ENCOUNTER — Other Ambulatory Visit: Payer: Self-pay | Admitting: *Deleted

## 2016-04-10 ENCOUNTER — Telehealth: Payer: Self-pay | Admitting: Internal Medicine

## 2016-04-10 DIAGNOSIS — C9001 Multiple myeloma in remission: Secondary | ICD-10-CM

## 2016-04-10 DIAGNOSIS — Z0000 Encounter for general adult medical examination without abnormal findings: Secondary | ICD-10-CM

## 2016-04-10 NOTE — Telephone Encounter (Signed)
Pt called to ask about whether she should have zometa at 4 week visit because she thought Dr. B wanted her to do it every 4 weeks. She also wants a referral to Halina Maidens as a primary care md. She will be requesting a referral to a spine center but that she is first checking to see what doctors are in network for her. Also what would be a good substitute for magnesium? It gives her severe diahrrea. Is there anything else that can help with her leg cramps because she is low on Mg? Please call to discuss w/pt. Thanks.

## 2016-04-10 NOTE — Telephone Encounter (Signed)
msg sent to cancer ctr sch. To arrange for zometa in 4 weeks. Also sent msg to sch. To sch. Pt for Dr. Carolin Coy (est. With PCP).  Her PCP will nee to refer her to the spine ctr.  - Attempted to reach patient at 1600. No answer phone just rang. Contacted patient at 1635. Spoke with patient. Instructed patient that Dr. Jacinto Reap will sch. zometa again in 4 weeks.  Md will ref patient to the desired pcp. However, he declines ref pt to spine clinic. This needs to be deferred to pcp. I explained that Dr. Bradd Canary will be glad to check a mag level on her at the next lab visit. The patient states that she prefers to rcv IV mag instead of taking oral magnesium. States that she "feels that IV mag is more tolerable instead of oral mag.  She asked if this could be scheduled. I explained that Dr. Rogue Bussing can not sch. The patient for IV mag unless a mag level is drawn to justify her need for IV mag.  At this time, md does not recommend any different OTC recommendations for oral magnesium.  She gave verbal understanding.

## 2016-04-12 ENCOUNTER — Ambulatory Visit: Payer: Self-pay | Admitting: Internal Medicine

## 2016-04-12 ENCOUNTER — Encounter: Payer: Self-pay | Admitting: Internal Medicine

## 2016-04-12 DIAGNOSIS — F418 Other specified anxiety disorders: Secondary | ICD-10-CM

## 2016-04-12 DIAGNOSIS — E118 Type 2 diabetes mellitus with unspecified complications: Secondary | ICD-10-CM | POA: Insufficient documentation

## 2016-04-12 DIAGNOSIS — F324 Major depressive disorder, single episode, in partial remission: Secondary | ICD-10-CM | POA: Insufficient documentation

## 2016-04-12 DIAGNOSIS — E1129 Type 2 diabetes mellitus with other diabetic kidney complication: Secondary | ICD-10-CM

## 2016-04-12 DIAGNOSIS — K760 Fatty (change of) liver, not elsewhere classified: Secondary | ICD-10-CM | POA: Insufficient documentation

## 2016-04-12 DIAGNOSIS — K589 Irritable bowel syndrome without diarrhea: Secondary | ICD-10-CM | POA: Insufficient documentation

## 2016-04-12 DIAGNOSIS — K219 Gastro-esophageal reflux disease without esophagitis: Secondary | ICD-10-CM

## 2016-04-18 ENCOUNTER — Other Ambulatory Visit: Payer: Self-pay | Admitting: Internal Medicine

## 2016-04-18 DIAGNOSIS — F32A Depression, unspecified: Secondary | ICD-10-CM

## 2016-04-18 DIAGNOSIS — F419 Anxiety disorder, unspecified: Principal | ICD-10-CM

## 2016-04-18 DIAGNOSIS — F329 Major depressive disorder, single episode, unspecified: Secondary | ICD-10-CM

## 2016-04-18 MED ORDER — REVLIMID 5 MG PO CAPS: 5.0000 mg | ORAL_CAPSULE | Freq: Every day | ORAL | 4 refills | Status: DC

## 2016-04-18 NOTE — Telephone Encounter (Signed)
Contacted [employer] to enroll pt under Dr. Aletha Halim name. Barnabas Lister was able to assist me.  prescriber survey performed.  Auth number [id number]. New RX sent to [facility].    Patient is ready to perform prescriber survey.   I contacted patient and left msg - reminded her to take her next survey.

## 2016-04-18 NOTE — Addendum Note (Signed)
Addended by: Sabino Gasser on: 04/18/2016 05:22 PM   Modules accepted: Orders

## 2016-04-19 ENCOUNTER — Ambulatory Visit (INDEPENDENT_AMBULATORY_CARE_PROVIDER_SITE_OTHER): Payer: 59 | Admitting: Internal Medicine

## 2016-04-19 ENCOUNTER — Encounter: Payer: Self-pay | Admitting: Internal Medicine

## 2016-04-19 VITALS — BP 132/80 | HR 85 | Resp 16 | Ht 63.0 in | Wt 140.0 lb

## 2016-04-19 DIAGNOSIS — F5104 Psychophysiologic insomnia: Secondary | ICD-10-CM | POA: Diagnosis not present

## 2016-04-19 DIAGNOSIS — F418 Other specified anxiety disorders: Secondary | ICD-10-CM | POA: Diagnosis not present

## 2016-04-19 DIAGNOSIS — Z8679 Personal history of other diseases of the circulatory system: Secondary | ICD-10-CM

## 2016-04-19 DIAGNOSIS — G4700 Insomnia, unspecified: Secondary | ICD-10-CM | POA: Insufficient documentation

## 2016-04-19 DIAGNOSIS — K219 Gastro-esophageal reflux disease without esophagitis: Secondary | ICD-10-CM

## 2016-04-19 DIAGNOSIS — M48061 Spinal stenosis, lumbar region without neurogenic claudication: Secondary | ICD-10-CM

## 2016-04-19 DIAGNOSIS — Q231 Congenital insufficiency of aortic valve: Secondary | ICD-10-CM | POA: Insufficient documentation

## 2016-04-19 DIAGNOSIS — C9001 Multiple myeloma in remission: Secondary | ICD-10-CM | POA: Diagnosis not present

## 2016-04-19 DIAGNOSIS — J3089 Other allergic rhinitis: Secondary | ICD-10-CM | POA: Diagnosis not present

## 2016-04-19 DIAGNOSIS — K589 Irritable bowel syndrome without diarrhea: Secondary | ICD-10-CM

## 2016-04-19 DIAGNOSIS — I351 Nonrheumatic aortic (valve) insufficiency: Secondary | ICD-10-CM

## 2016-04-19 DIAGNOSIS — J45909 Unspecified asthma, uncomplicated: Secondary | ICD-10-CM | POA: Insufficient documentation

## 2016-04-19 DIAGNOSIS — R42 Dizziness and giddiness: Secondary | ICD-10-CM

## 2016-04-19 DIAGNOSIS — N182 Chronic kidney disease, stage 2 (mild): Secondary | ICD-10-CM

## 2016-04-19 DIAGNOSIS — E1122 Type 2 diabetes mellitus with diabetic chronic kidney disease: Secondary | ICD-10-CM

## 2016-04-19 HISTORY — DX: Nonrheumatic aortic (valve) insufficiency: I35.1

## 2016-04-19 MED ORDER — TRAZODONE HCL 50 MG PO TABS
50.0000 mg | ORAL_TABLET | Freq: Every evening | ORAL | 2 refills | Status: DC | PRN
Start: 2016-04-19 — End: 2016-05-30

## 2016-04-19 NOTE — Progress Notes (Signed)
Date:  04/19/2016   Name:  Sarah [name]   DOB:  1956/04/16   MRN:  [id number]  Recently widowed and moved to [place] to live with sister. Chief Complaint: Establish Care (Moved from [place] 4 weeks ago. ); Back Pain (Needs ref for Stenosis and Buldging Disc and 8 pinched nerves. ); Nail Problem (Needs Podiatry ref Diabetic and has bruised toe nails and has lost one before. ); Heart Problem (Bicuspid Heart Valve and had CHF 2006 but has been cleared from that.); and Chronic Kidney Disease (Stage 3 renal failure )  Back Pain  This is a chronic problem. Episode onset: 2005. The pain is present in the lumbar spine. The pain is moderate. Associated symptoms include abdominal pain. Pertinent negatives include no bladder incontinence, bowel incontinence, chest pain, fever, headaches or paresthesias. Risk factors include history of cancer. Treatments tried: pain clinic management - ESI and ablations.  Heart Problem  This is a chronic (bicuspid aortic valve with hx of CHF) problem. The problem has been resolved. Associated symptoms include abdominal pain. Pertinent negatives include no chest pain, chills, coughing, fatigue, fever, headaches or rash. Treatments tried: also had ablation for SVT. Improvement on treatment: beta blocker therapy.  Diabetes  She presents for her follow-up diabetic visit. She has type 2 diabetes mellitus. Her disease course has been stable. Hypoglycemia symptoms include dizziness and nervousness/anxiousness. Pertinent negatives for hypoglycemia include no headaches. Pertinent negatives for diabetes include no chest pain and no fatigue. Current diabetic treatment includes oral agent (monotherapy) (glipizide). An ACE inhibitor/angiotensin II receptor blocker is not being taken.  Gastroesophageal Reflux  She complains of abdominal pain and heartburn. She reports no chest pain or no coughing. This is a recurrent problem. Pertinent negatives include no fatigue.  Hypertension  This is a  chronic problem. The current episode started more than 1 year ago. The problem is unchanged. The problem is controlled. Pertinent negatives include no chest pain, headaches, palpitations or shortness of breath.  Insomnia  Primary symptoms: sleep disturbance, difficulty falling asleep.  The current episode started more than one year. The onset quality is gradual. The problem occurs nightly. The problem is unchanged. The symptoms are aggravated by anxiety and family issues. Treatments tried: ambien, xanax, over the counter all without relief.  Gout - new onset in left wrist started last year.  Takes allopurinol.   Lab Results  Component Value Date   CREATININE 0.91 04/02/2016   Lab Results  Component Value Date   WBC 5.4 04/02/2016   HGB 11.2 (L) 04/02/2016   HCT 32.0 (L) 04/02/2016   MCV 91.3 04/02/2016   PLT 94 (L) 04/02/2016      Review of Systems  Constitutional: Negative for chills, fatigue and fever.  HENT: Positive for ear pain, postnasal drip and sinus pressure.   Respiratory: Negative for cough, chest tightness and shortness of breath.   Cardiovascular: Negative for chest pain, palpitations and leg swelling.  Gastrointestinal: Positive for abdominal pain, diarrhea and heartburn. Negative for blood in stool and bowel incontinence.  Genitourinary: Negative for bladder incontinence.  Musculoskeletal: Positive for back pain.  Skin: Negative for rash and wound.  Neurological: Positive for dizziness. Negative for syncope, headaches and paresthesias.  Psychiatric/Behavioral: Positive for sleep disturbance. Negative for decreased concentration, dysphoric mood and suicidal ideas. The patient is nervous/anxious and has insomnia.     Patient Active Problem List   Diagnosis Date Noted  . DM (diabetes mellitus), type 2 with renal complications ([place]) 72/53/6644  . Anemia  04/12/2016  . GERD (gastroesophageal reflux disease) 04/12/2016  . Depression with anxiety 04/12/2016  .  Irritable bowel syndrome (IBS) 04/12/2016  . Hepatic steatosis 04/12/2016  . Multiple myeloma in remission ([place]) 04/02/2016    Prior to Admission medications   Medication Sig Start Date End Date Taking? Authorizing Provider  acyclovir (ZOVIRAX) 200 MG capsule Take 1 capsule by mouth daily. 03/24/16   Historical Provider, MD  allopurinol (ZYLOPRIM) 100 MG tablet Take 1 tablet by mouth daily as needed. gout 02/04/16   Historical Provider, MD  ALPRAZolam Duanne Moron) 1 MG tablet Take 1 tablet (1 mg total) by mouth 2 (two) times daily as needed for anxiety. 04/02/16   Cammie Sickle, MD  bisoprolol (ZEBETA) 10 MG tablet Take 1 tablet by mouth daily. 03/13/16   Historical Provider, MD  Calcium Carbonate-Vitamin D (CALTRATE 600+D) 600-400 MG-UNIT tablet Take 1 tablet by mouth daily.    Historical Provider, MD  diphenoxylate-atropine (LOMOTIL) 2.5-0.025 MG tablet Take 2 tablets by mouth 4 (four) times daily as needed for diarrhea or loose stools. 04/02/16   Cammie Sickle, MD  DULoxetine (CYMBALTA) 60 MG capsule Take 1 capsule by mouth 2 (two) times daily. 03/06/16   Historical Provider, MD  glipiZIDE (GLUCOTROL XL) 2.5 MG 24 hr tablet Take 1 tablet (2.5 mg total) by mouth daily with breakfast. 04/02/16   Cammie Sickle, MD  HYDROcodone-ibuprofen (VICOPROFEN) 7.5-200 MG tablet Take 1 tablet by mouth every 8 (eight) hours as needed. Pain 04/02/16   Cammie Sickle, MD  hydrOXYzine (ATARAX/VISTARIL) 10 MG tablet Take 10 mg by mouth 3 (three) times daily as needed.    Historical Provider, MD  loperamide (IMODIUM A-D) 2 MG tablet Take 2 mg by mouth 4 (four) times daily as needed for diarrhea or loose stools.    Historical Provider, MD  montelukast (SINGULAIR) 10 MG tablet Take 1 tablet by mouth daily. 03/23/16   Historical Provider, MD  Multiple Vitamins-Minerals (HAIR/SKIN/NAILS/BIOTIN PO) Take 1 capsule by mouth 3 (three) times daily.    Historical Provider, MD  omeprazole (PRILOSEC) 40 MG capsule  Take 1 capsule (40 mg total) by mouth 2 (two) times daily. 04/09/16   Cammie Sickle, MD  polyethylene glycol (MIRALAX / GLYCOLAX) packet Take 17 g by mouth daily as needed for moderate constipation.    Historical Provider, MD  promethazine (PHENERGAN) 25 MG tablet Take 1 tablet by mouth every 6 (six) hours as needed. anxiety 02/09/16   Historical Provider, MD  REVLIMID 5 MG capsule Take 1 capsule (5 mg total) by mouth daily. X 21 days.  1 week off 04/18/16   Cammie Sickle, MD  tiZANidine (ZANAFLEX) 4 MG tablet Take 1 tablet by mouth 3 (three) times daily as needed. Muscle relaxer for back pain 03/23/16   Historical Provider, MD  zolpidem (AMBIEN CR) 12.5 MG CR tablet Take 1 tablet (12.5 mg total) by mouth at bedtime. 04/02/16   Cammie Sickle, MD    Allergies  Allergen Reactions  . Benadryl [Diphenhydramine] Palpitations  . Morphine And Related Itching and Rash  . Tylenol [Acetaminophen] Itching and Rash  . Zofran [Ondansetron Hcl] Diarrhea    Past Surgical History:  Procedure Laterality Date  . 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 CARDIAC ELECTROPHYSIOLOGY MAPPING AND ABLATION    . CARPAL TUNNEL RELEASE Bilateral   . CHOLECYSTECTOMY  2008  . FOOT SURGERY Bilateral   . INCONTINENCE SURGERY  2009  . PARTIAL HYSTERECTOMY  03/1996   fibroids  .  TONSILLECTOMY  2007    Social History  Substance Use Topics  . Smoking status: Former Smoker    Packs/day: 1.00    Years: 20.00    Types: Cigarettes    Quit date: 07/02/1991  . Smokeless tobacco: Never Used  . Alcohol use 1.2 oz/week    1 Glasses of wine, 1 Cans of beer per week     Comment: occassional     Medication list has been reviewed and updated.   Physical Exam  Constitutional: She is oriented to person, place, and time. She appears well-developed. No distress.  HENT:  Head: Normocephalic and atraumatic.  Right Ear: Tympanic membrane and ear canal normal.  Left Ear: Tympanic membrane and ear canal normal.   Nose: Right sinus exhibits no maxillary sinus tenderness and no frontal sinus tenderness. Left sinus exhibits no maxillary sinus tenderness and no frontal sinus tenderness.  Mouth/Throat: Oropharynx is clear and moist.  Neck: Normal range of motion. No thyromegaly present.  Cardiovascular: Normal rate, regular rhythm and normal heart sounds.   Pulmonary/Chest: Effort normal and breath sounds normal. No respiratory distress. She has no decreased breath sounds. She has no wheezes.  Musculoskeletal: Normal range of motion.  Neurological: She is alert and oriented to person, place, and time.  Skin: Skin is warm and dry. No rash noted.  Discolored right great toe nail Left great toe nail partially avulsed - no s/s infection  Psychiatric: She has a normal mood and affect. Her behavior is normal. Thought content normal.  Nursing note and vitals reviewed.   BP 132/80   Pulse 85   Resp 16   Ht 5' 3"  (1.6 m)   Wt 140 lb (63.5 kg)   SpO2 100%   BMI 24.80 kg/m   Assessment and Plan: 1. Type 2 diabetes mellitus with stage 2 chronic kidney disease, without long-term current use of insulin (HCC) On oral agent - continue diet and will advise on follow up interval - Hemoglobin X3G - Basic metabolic panel  2. Gastroesophageal reflux disease without esophagitis Fair control on bid omeprazole  3. Multiple myeloma in remission [facility]) Followed by Oncology Will go to [facility] for follow up on BMT  4. Depression with anxiety Mildly sx at this time - mainly manifest as poor sleep  5. Irritable bowel syndrome, unspecified type Continue imodium as needed  6. Environmental and seasonal allergies On montelukast  7. Psychophysiological insomnia Stop ambien and xanax Consider Psychiatry referral - traZODone (DESYREL) 50 MG tablet; Take 1-2 tablets (50-100 mg total) by mouth at bedtime as needed for sleep.  Dispense: 60 tablet; Refill: 2  8. Spinal stenosis at L4-L5 level Able to see records at  [facility] in [place] Patient advised that I do not prescribe chronic narcotics - Ambulatory referral to [place], MD [facility]  04/19/2016

## 2016-04-20 LAB — HEMOGLOBIN A1C
ESTIMATED AVERAGE GLUCOSE: 100 mg/dL
HEMOGLOBIN A1C: 5.1 % (ref 4.8–5.6)

## 2016-04-20 LAB — BASIC METABOLIC PANEL
BUN / CREAT RATIO: 21 (ref 9–23)
BUN: 22 mg/dL (ref 6–24)
CHLORIDE: 100 mmol/L (ref 96–106)
CO2: 21 mmol/L (ref 18–29)
Calcium: 10.3 mg/dL — ABNORMAL HIGH (ref 8.7–10.2)
Creatinine, Ser: 1.05 mg/dL — ABNORMAL HIGH (ref 0.57–1.00)
GFR calc non Af Amer: 58 mL/min/{1.73_m2} — ABNORMAL LOW (ref >59)
GFR, EST AFRICAN AMERICAN: 67 mL/min/{1.73_m2} (ref >59)
Glucose: 123 mg/dL — ABNORMAL HIGH (ref 65–99)
POTASSIUM: 4.2 mmol/L (ref 3.5–5.2)
Sodium: 141 mmol/L (ref 134–144)

## 2016-05-03 ENCOUNTER — Other Ambulatory Visit: Payer: Self-pay | Admitting: Internal Medicine

## 2016-05-07 ENCOUNTER — Inpatient Hospital Stay: Payer: 59

## 2016-05-07 ENCOUNTER — Inpatient Hospital Stay: Payer: 59 | Attending: Internal Medicine

## 2016-05-07 ENCOUNTER — Other Ambulatory Visit: Payer: Self-pay | Admitting: Internal Medicine

## 2016-05-07 VITALS — BP 118/71 | HR 73 | Temp 95.8°F | Resp 18

## 2016-05-07 DIAGNOSIS — C9001 Multiple myeloma in remission: Secondary | ICD-10-CM

## 2016-05-07 DIAGNOSIS — Z79899 Other long term (current) drug therapy: Secondary | ICD-10-CM | POA: Insufficient documentation

## 2016-05-07 LAB — CBC WITH DIFFERENTIAL/PLATELET
BASOS ABS: 0 10*3/uL (ref 0–0.1)
BASOS PCT: 1 %
Eosinophils Absolute: 0.2 10*3/uL (ref 0–0.7)
Eosinophils Relative: 6 %
HCT: 34.7 % — ABNORMAL LOW (ref 35.0–47.0)
HEMOGLOBIN: 12 g/dL (ref 12.0–16.0)
LYMPHS PCT: 24 %
Lymphs Abs: 0.7 10*3/uL — ABNORMAL LOW (ref 1.0–3.6)
MCH: 31.8 pg (ref 26.0–34.0)
MCHC: 34.6 g/dL (ref 32.0–36.0)
MCV: 91.7 fL (ref 80.0–100.0)
Monocytes Absolute: 0.3 10*3/uL (ref 0.2–0.9)
Monocytes Relative: 9 %
NEUTROS ABS: 1.9 10*3/uL (ref 1.4–6.5)
NEUTROS PCT: 60 %
Platelets: 120 10*3/uL — ABNORMAL LOW (ref 150–440)
RBC: 3.78 MIL/uL — AB (ref 3.80–5.20)
RDW: 14.5 % (ref 11.5–14.5)
WBC: 3.1 10*3/uL — AB (ref 3.6–11.0)

## 2016-05-07 LAB — BASIC METABOLIC PANEL
ANION GAP: 9 (ref 5–15)
BUN: 18 mg/dL (ref 6–20)
CALCIUM: 8.9 mg/dL (ref 8.9–10.3)
CO2: 24 mmol/L (ref 22–32)
Chloride: 105 mmol/L (ref 101–111)
Creatinine, Ser: 1.03 mg/dL — ABNORMAL HIGH (ref 0.44–1.00)
GFR, EST NON AFRICAN AMERICAN: 58 mL/min — AB (ref >60)
GLUCOSE: 122 mg/dL — AB (ref 65–99)
POTASSIUM: 3.7 mmol/L (ref 3.5–5.1)
Sodium: 138 mmol/L (ref 135–145)

## 2016-05-07 LAB — MAGNESIUM: MAGNESIUM: 1 mg/dL — AB (ref 1.7–2.4)

## 2016-05-07 MED ORDER — ZOLEDRONIC ACID 4 MG/100ML IV SOLN
4.0000 mg | Freq: Once | INTRAVENOUS | Status: AC
  Administered 2016-05-07: 4 mg via INTRAVENOUS
  Filled 2016-05-07: qty 100

## 2016-05-07 MED ORDER — SODIUM CHLORIDE 0.9 % IV SOLN
Freq: Once | INTRAVENOUS | Status: AC
  Administered 2016-05-07: 11:00:00 via INTRAVENOUS
  Filled 2016-05-07: qty 1000

## 2016-05-07 MED ORDER — MAGNESIUM SULFATE 4 GM/100ML IV SOLN
4.0000 g | Freq: Once | INTRAVENOUS | Status: AC
  Administered 2016-05-07: 4 g via INTRAVENOUS
  Filled 2016-05-07: qty 100

## 2016-05-21 ENCOUNTER — Emergency Department: Payer: Managed Care, Other (non HMO)

## 2016-05-21 ENCOUNTER — Telehealth: Payer: Self-pay | Admitting: *Deleted

## 2016-05-21 ENCOUNTER — Emergency Department
Admission: EM | Admit: 2016-05-21 | Discharge: 2016-05-21 | Disposition: A | Discharge status: Home / Self Care | Payer: Managed Care, Other (non HMO) | Attending: Emergency Medicine | Admitting: Emergency Medicine

## 2016-05-21 ENCOUNTER — Encounter: Payer: Self-pay | Admitting: Emergency Medicine

## 2016-05-21 DIAGNOSIS — J069 Acute upper respiratory infection, unspecified: Secondary | ICD-10-CM

## 2016-05-21 DIAGNOSIS — N183 Chronic kidney disease, stage 3 (moderate): Secondary | ICD-10-CM | POA: Diagnosis not present

## 2016-05-21 DIAGNOSIS — E1122 Type 2 diabetes mellitus with diabetic chronic kidney disease: Secondary | ICD-10-CM | POA: Insufficient documentation

## 2016-05-21 DIAGNOSIS — R079 Chest pain, unspecified: Secondary | ICD-10-CM

## 2016-05-21 DIAGNOSIS — I509 Heart failure, unspecified: Secondary | ICD-10-CM | POA: Diagnosis not present

## 2016-05-21 DIAGNOSIS — Z7984 Long term (current) use of oral hypoglycemic drugs: Secondary | ICD-10-CM | POA: Insufficient documentation

## 2016-05-21 DIAGNOSIS — Z87891 Personal history of nicotine dependence: Secondary | ICD-10-CM | POA: Insufficient documentation

## 2016-05-21 DIAGNOSIS — I130 Hypertensive heart and chronic kidney disease with heart failure and stage 1 through stage 4 chronic kidney disease, or unspecified chronic kidney disease: Secondary | ICD-10-CM | POA: Insufficient documentation

## 2016-05-21 HISTORY — DX: Essential (primary) hypertension: I10

## 2016-05-21 LAB — COMPREHENSIVE METABOLIC PANEL
ALK PHOS: 87 U/L (ref 38–126)
ALT: 31 U/L (ref 14–54)
ANION GAP: 11 (ref 5–15)
AST: 33 U/L (ref 15–41)
Albumin: 4.5 g/dL (ref 3.5–5.0)
BILIRUBIN TOTAL: 0.6 mg/dL (ref 0.3–1.2)
BUN: 16 mg/dL (ref 6–20)
CALCIUM: 8.4 mg/dL — AB (ref 8.9–10.3)
CO2: 23 mmol/L (ref 22–32)
Chloride: 103 mmol/L (ref 101–111)
Creatinine, Ser: 1.13 mg/dL — ABNORMAL HIGH (ref 0.44–1.00)
GFR calc Af Amer: >60 mL/min (ref >60)
GFR, EST NON AFRICAN AMERICAN: 52 mL/min — AB (ref >60)
GLUCOSE: 103 mg/dL — AB (ref 65–99)
POTASSIUM: 3.6 mmol/L (ref 3.5–5.1)
Sodium: 137 mmol/L (ref 135–145)
TOTAL PROTEIN: 7.1 g/dL (ref 6.5–8.1)

## 2016-05-21 LAB — URINALYSIS COMPLETE WITH MICROSCOPIC (ARMC ONLY)
BILIRUBIN URINE: NEGATIVE
Bacteria, UA: NONE SEEN
GLUCOSE, UA: NEGATIVE mg/dL
Hgb urine dipstick: NEGATIVE
KETONES UR: NEGATIVE mg/dL
Leukocytes, UA: NEGATIVE
Nitrite: NEGATIVE
PH: 7 (ref 5.0–8.0)
Protein, ur: 30 mg/dL — AB
Specific Gravity, Urine: 1.011 (ref 1.005–1.030)

## 2016-05-21 LAB — CBC WITH DIFFERENTIAL/PLATELET
Basophils Absolute: 0 10*3/uL (ref 0–0.1)
Basophils Relative: 1 %
Eosinophils Absolute: 0.2 10*3/uL (ref 0–0.7)
Eosinophils Relative: 5 %
HEMATOCRIT: 31.1 % — AB (ref 35.0–47.0)
HEMOGLOBIN: 11.1 g/dL — AB (ref 12.0–16.0)
LYMPHS PCT: 21 %
Lymphs Abs: 0.8 10*3/uL — ABNORMAL LOW (ref 1.0–3.6)
MCH: 32.4 pg (ref 26.0–34.0)
MCHC: 35.8 g/dL (ref 32.0–36.0)
MCV: 90.5 fL (ref 80.0–100.0)
MONO ABS: 0.4 10*3/uL (ref 0.2–0.9)
MONOS PCT: 10 %
NEUTROS ABS: 2.4 10*3/uL (ref 1.4–6.5)
NEUTROS PCT: 63 %
Platelets: 119 10*3/uL — ABNORMAL LOW (ref 150–440)
RBC: 3.43 MIL/uL — ABNORMAL LOW (ref 3.80–5.20)
RDW: 14.2 % (ref 11.5–14.5)
WBC: 3.8 10*3/uL (ref 3.6–11.0)

## 2016-05-21 LAB — MAGNESIUM: Magnesium: 1 mg/dL — ABNORMAL LOW (ref 1.7–2.4)

## 2016-05-21 LAB — TROPONIN I
Troponin I: <0.03 ng/mL (ref <0.03)
Troponin I: <0.03 ng/mL (ref <0.03)

## 2016-05-21 MED ORDER — IOPAMIDOL (ISOVUE-370) INJECTION 76%
75.0000 mL | Freq: Once | INTRAVENOUS | Status: AC | PRN
  Administered 2016-05-21: 75 mL via INTRAVENOUS

## 2016-05-21 MED ORDER — SODIUM CHLORIDE 0.9 % IV BOLUS (SEPSIS): 1000.0000 mL | Freq: Once | INTRAVENOUS | Status: DC

## 2016-05-21 MED ORDER — SODIUM CHLORIDE 0.9 % IV BOLUS (SEPSIS)
500.0000 mL | Freq: Once | INTRAVENOUS | Status: AC
  Administered 2016-05-21: 500 mL via INTRAVENOUS

## 2016-05-21 MED ORDER — MAGNESIUM SULFATE 2 GM/50ML IV SOLN
2.0000 g | Freq: Once | INTRAVENOUS | Status: AC
  Administered 2016-05-21: 2 g via INTRAVENOUS
  Filled 2016-05-21: qty 50

## 2016-05-21 MED ORDER — ALBUTEROL SULFATE HFA 108 (90 BASE) MCG/ACT IN AERS: 2.0000 | INHALATION_SPRAY | Freq: Four times a day (QID) | RESPIRATORY_TRACT | 0 refills | Status: DC | PRN

## 2016-05-21 NOTE — Telephone Encounter (Signed)
Patient called cancer center [facility] [facility] with c/o "shortness of breath and heart palpations x several days and getting worse. I don't know if it's my revlimid or not?"  Advised patient to go to ED to further evaluate and work up s/s.  Pt gave verbal understanding. Teach back process performed.

## 2016-05-21 NOTE — ED Triage Notes (Signed)
Patient presents to the ED with two days of intermittent chest discomfort/heaviness, and shortness of breath and dizziness, worse with exertion.  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  Patient appears pale and somewhat short of breath.  Patient is calm and cooperative at this time.

## 2016-05-21 NOTE — Telephone Encounter (Signed)
I agree . Thx.  

## 2016-05-21 NOTE — ED Provider Notes (Signed)
[facility] Emergency Department Provider Note  ____________________________________________   First MD Initiated Contact with Patient 05/21/16 1621     (approximate)  I have reviewed the triage vital signs and the nursing notes.   HISTORY  Chief Complaint Chest Pain and Shortness of Breath   HPI Sarah [name] is a 60 y.o. female with a history of CHF, multiple myeloma and depression who is presenting to the emergency department today with chest pain over the past 4 days. She says that the pain is like a pressure and over the left side of her chest and is a 6 out of 10. She says that she also has a tingling to her left upper extremity. She also has associated shortness of breath which is worse with exertion. She is concerned that she may be heart failure. She says that previously when she was in heart failure she felt similarly. The patient denies any exacerbating factors of the chest pain. Says that she has associated lightheadedness but no nausea or vomiting. Recently had a catheterization but this was in [place]. I'm able to view the reports and there was no significant stenosis requiring any intervention. Patient says that she also has a chronically low magnesium and despite taking supplements at home is unable to get above 1 where it has been over the past several weeks.   Past Medical History:  Diagnosis Date  . Anemia   . Anxiety   . Arthritis   . Bicuspid aortic valve   . CHF (congestive heart failure) ([place])   . CKD (chronic kidney disease) stage 3, GFR 30-59 ml/min   . Depression   . Diabetes mellitus ([place])   . Dizziness   . Fatty liver   . Frequent falls   . GERD (gastroesophageal reflux disease)   . Gout   . Heart murmur   . History of blood transfusion   . History of bone marrow transplant ([place])   . History of uterine fibroid   . Hypertension   . Hypomagnesemia   . Multiple myeloma ([place])   . Renal cyst     Patient Active Problem  List   Diagnosis Date Noted  . Environmental and seasonal allergies 04/19/2016  . Insomnia 04/19/2016  . Bicuspid aortic valve 04/19/2016  . Asthma 04/19/2016  . Aortic regurgitation 04/19/2016  . History of CHF (congestive heart failure) 04/19/2016  . DM (diabetes mellitus), type 2 with renal complications ([place]) 87/19/5974  . Anemia 04/12/2016  . GERD (gastroesophageal reflux disease) 04/12/2016  . Depression with anxiety 04/12/2016  . Irritable bowel syndrome (IBS) 04/12/2016  . Hepatic steatosis 04/12/2016  . Multiple myeloma in remission ([place]) 04/02/2016  . Abnormal stress test 02/14/2016  . 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 Disequilibrium 01/14/2013    Past Surgical History:  Procedure Laterality Date  . 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 CARDIAC ELECTROPHYSIOLOGY MAPPING AND ABLATION    . CARPAL TUNNEL RELEASE Bilateral   . CHOLECYSTECTOMY  2008  . FOOT SURGERY Bilateral   . INCONTINENCE SURGERY  2009  . PARTIAL HYSTERECTOMY  03/1996   fibroids  . TONSILLECTOMY  2007    Prior to Admission medications   Medication Sig Start Date End Date Taking? Authorizing Provider  acyclovir (ZOVIRAX) 200 MG capsule Take 1 capsule by mouth daily. 03/24/16   Historical Provider, MD  allopurinol (ZYLOPRIM) 100 MG tablet Take 1 tablet by mouth daily as needed. gout 02/04/16   Historical Provider, MD  bisoprolol (ZEBETA) 10 MG tablet Take 1 tablet by mouth daily. 03/13/16   Historical Provider, MD  Blood Glucose Monitoring Suppl (ONE TOUCH ULTRA 2) w/Device KIT  09/04/15   Historical Provider, MD  Calcium Carbonate-Vitamin D (CALTRATE 600+D) 600-400 MG-UNIT tablet Take 1 tablet by mouth daily.    Historical Provider, MD  diphenoxylate-atropine (LOMOTIL) 2.5-0.025 MG tablet Take 2 tablets by mouth 4 (four) times daily as needed for diarrhea or loose stools. 04/02/16   Cammie Sickle, MD  DULoxetine (CYMBALTA) 60 MG capsule Take 1 capsule by mouth 2 (two) times  daily. 03/06/16   Historical Provider, MD  glipiZIDE (GLUCOTROL XL) 2.5 MG 24 hr tablet Take 1 tablet (2.5 mg total) by mouth daily with breakfast. 04/02/16   Cammie Sickle, MD  glucose blood (ONE TOUCH ULTRA TEST) test strip  09/04/15   Historical Provider, MD  HYDROcodone-ibuprofen (VICOPROFEN) 7.5-200 MG tablet Take 1 tablet by mouth every 8 (eight) hours as needed. Pain 04/02/16   Cammie Sickle, MD  hydrOXYzine (ATARAX/VISTARIL) 10 MG tablet Take 10 mg by mouth 3 (three) times daily as needed.    Historical Provider, MD  loperamide (IMODIUM A-D) 2 MG tablet Take 2 mg by mouth 4 (four) times daily as needed for diarrhea or loose stools.    Historical Provider, MD  montelukast (SINGULAIR) 10 MG tablet Take 1 tablet by mouth daily. 03/23/16   Historical Provider, MD  Multiple Vitamins-Minerals (HAIR/SKIN/NAILS/BIOTIN PO) Take 1 capsule by mouth 3 (three) times daily.    Historical Provider, MD  omeprazole (PRILOSEC) 40 MG capsule Take 1 capsule (40 mg total) by mouth 2 (two) times daily. 04/09/16   Cammie Sickle, MD  Elsie LANCETS 47Q MISC  09/04/15   Historical Provider, MD  promethazine (PHENERGAN) 25 MG tablet Take 1 tablet by mouth every 6 (six) hours as needed. anxiety 02/09/16   Historical Provider, MD  REVLIMID 5 MG capsule Take 1 capsule (5 mg total) by mouth daily. X 21 days.  1 week off 04/18/16   Cammie Sickle, MD  tiZANidine (ZANAFLEX) 4 MG tablet Take 1 tablet by mouth 3 (three) times daily as needed. Muscle relaxer for back pain 03/23/16   Historical Provider, MD  traZODone (DESYREL) 50 MG tablet Take 1-2 tablets (50-100 mg total) by mouth at bedtime as needed for sleep. 04/19/16   Glean Hess, MD  Zoledronic Acid (ZOMETA IV) Inject into the vein every 30 (thirty) days.    Historical Provider, MD    Allergies Benadryl [diphenhydramine]; Morphine and related; Tylenol [acetaminophen]; and Zofran [ondansetron hcl]  Family History  Problem Relation Age  of Onset  . Colon cancer Father   . Renal Disease Father   . Diabetes Mellitus II Father   . Melanoma Paternal Grandmother   . Breast cancer Maternal Aunt   . Anemia Mother   . Heart disease Mother   . Heart failure Mother   . Renal Disease Mother   . Congestive Heart Failure Mother   . Heart disease Maternal Uncle   . Throat cancer Maternal Uncle   . Lung cancer Maternal Uncle   . Liver disease Maternal Uncle   . Heart failure Maternal Uncle   . Hearing loss Son 92    Suicide     Social History Social History  Substance Use Topics  . Smoking status: Former Smoker    Packs/day: 1.00    Years: 20.00    Types: Cigarettes    Quit date: 07/02/1991  .  Smokeless tobacco: Never Used  . Alcohol use 1.2 oz/week    1 Glasses of wine, 1 Cans of beer per week     Comment: occassional    Review of Systems Constitutional: No fever/chills Eyes: No visual changes. ENT: No sore throat. Cardiovascular: As above Respiratory: As above Gastrointestinal: No abdominal pain.  No nausea, no vomiting.  No diarrhea.  No constipation. Genitourinary: Negative for dysuria. Musculoskeletal: Negative for back pain. Skin: Negative for rash. Neurological: Negative for headaches, focal weakness or numbness.  10-point ROS otherwise negative.  ____________________________________________   PHYSICAL EXAM:  VITAL SIGNS: ED Triage Vitals  Enc Vitals Group     BP 05/21/16 1600 (!) 141/74     Pulse Rate 05/21/16 1600 63     Resp 05/21/16 1600 (!) 22     Temp 05/21/16 1600 98.7 F (37.1 C)     Temp Source 05/21/16 1600 Oral     SpO2 05/21/16 1600 100 %     Weight 05/21/16 1600 142 lb (64.4 kg)     Height 05/21/16 1600 5' 3"  (1.6 m)     Head Circumference --      Peak Flow --      Pain Score 05/21/16 1601 4     Pain Loc --      Pain Edu? --      Excl. in [place]? --     Constitutional: Alert and oriented. Well appearing and in no acute distress. Eyes: Conjunctivae are normal. PERRL.  EOMI. Head: Atraumatic. Nose: No congestion/rhinnorhea. Mouth/Throat: Mucous membranes are moist.   Neck: No stridor.   Cardiovascular: Normal rate, regular rhythm. Grossly normal heart sounds.  Respiratory: Normal respiratory effort.  No retractions. Lungs CTAB. Gastrointestinal: Soft and nontender. No distention.  Musculoskeletal: No lower extremity tenderness nor edema.  No joint effusions. Neurologic:  Normal speech and language. No gross focal neurologic deficits are appreciated. Skin:  Skin is warm, dry and intact. No rash noted. Psychiatric: Mood and affect are normal. Speech and behavior are normal.  ____________________________________________   LABS (all labs ordered are listed, but only abnormal results are displayed)  Labs Reviewed  CBC WITH DIFFERENTIAL/PLATELET - Abnormal; Notable for the following:       Result Value   RBC 3.43 (*)    Hemoglobin 11.1 (*)    HCT 31.1 (*)    Platelets 119 (*)    Lymphs Abs 0.8 (*)    All other components within normal limits  COMPREHENSIVE METABOLIC PANEL - Abnormal; Notable for the following:    Glucose, Bld 103 (*)    Creatinine, Ser 1.13 (*)    Calcium 8.4 (*)    GFR calc non Af Amer 52 (*)    All other components within normal limits  URINALYSIS COMPLETEWITH MICROSCOPIC ([facility] ONLY) - Abnormal; Notable for the following:    Color, Urine YELLOW (*)    APPearance CLEAR (*)    Protein, ur 30 (*)    Squamous Epithelial / LPF 0-5 (*)    All other components within normal limits  MAGNESIUM - Abnormal; Notable for the following:    Magnesium 1.0 (*)    All other components within normal limits  TROPONIN I  TROPONIN I   D: 02/07/2016 15:09:27  CathCoronary angiography: 1. Left main trunk is normal, divides into LAD and circumflex arteries. Left main is short.  2. LAD starts as a larger caliber vessel, becomes small as it travels down to the apex from the mid to distal segment. No  focal stenosis is identified in the LAD.  Diagonal branches are free of disease. 3. Circumflex and its branches is angiographically normal. 4. Right coronary artery and its branches is angiographically normal.  CONCLUSION(S): 1. Angiographically unremarkable epicardial coronaries. 2. Preserved LV systolic function by echocardiogram.  3. Small gradient across the aortic valve.  4. False positive Cardiolite stress testing possibly related to a small distal LAD.       ____________________________________________  EKG   ED ECG REPORT I, Tessie Ordaz,  Youlanda Roys, the attending physician, personally viewed and interpreted this ECG.   Date: 05/21/2016  EKG Time: 1553  Rate: 62  Rhythm: normal sinus rhythm  Axis: Normal  Intervals:none  ST&T Change: T wave inversions in 2, 3 and aVF as well as V2 through V6. Minimal depressions in the lateral leads versus baseline wander. Unable to visualize previous EKGs but previous EKGs as recent as the [place] showing a left bundle branch block from her records from [place].  Report of EKG from August 9 on the care of her records showing nonspecific T-wave abnormalities in the anterior leads. ____________ ________________________________  [id number]   DG Chest 1 View (Accession [id number]) (Order [id number])  Imaging  Date: 05/21/2016 Department: [facility] EMERGENCY DEPARTMENT Released By/Authorizing: Orbie Pyo, MD (auto-released)  Exam Information   Status Exam Begun  Exam Ended   Final [99] 05/21/2016 4:38 PM 05/21/2016 4:43 PM  PACS Images   Show images for DG Chest 1 View  Study Result   CLINICAL DATA:  Intermittent chest discomfort and heaviness, shortness of breath  EXAM: CHEST 1 VIEW  COMPARISON:  None.  FINDINGS: The heart size and mediastinal contours are within normal limits. Possible left suprahilar calcified lymph node. Both lungs are clear. The visualized skeletal structures are unremarkable.  IMPRESSION: No active  disease.   Electronically Signed   By: Donavan Foil M.D.   On: 05/21/2016 16:46    CT Angio Chest PE W and/or Wo Contrast (Accession [id number]) (Order [id number])  Imaging  Date: 05/21/2016 Department: [facility] EMERGENCY DEPARTMENT Released By/Authorizing: Orbie Pyo, MD (auto-released)  Exam Information   Status Exam Begun  Exam Ended   Final [99] 05/21/2016 5:23 PM 05/21/2016 5:34 PM  PACS Images   Show images for CT Angio Chest PE W and/or Wo Contrast  Study Result   CLINICAL DATA:  Chest discomfort, shortness of breath and dizziness today.  EXAM: CT ANGIOGRAPHY CHEST WITH CONTRAST  TECHNIQUE: Multidetector CT imaging of the chest was performed using the standard protocol during bolus administration of intravenous contrast. Multiplanar CT image reconstructions and MIPs were obtained to evaluate the vascular anatomy.  CONTRAST:  75 cc Isovue 370.  COMPARISON:  Single-view of the chest today appear  FINDINGS: Cardiovascular: No pulmonary embolus is identified. Heart size is normal. No pericardial effusion.  Mediastinum/Nodes: Calcified mediastinal and hilar lymph nodes are consistent with old granulomatous disease. No pathologically enlarged lymph nodes are identified. The thyroid gland is unremarkable. No hiatal hernia.  Lungs/Pleura: No pleural effusion.  The lungs are clear.  Upper Abdomen: Status post cholecystectomy.  Musculoskeletal: Lucent lesion in the left humeral head has a well-defined border and is most consistent with a degenerative cyst.  Review of the MIP images confirms the above findings.  IMPRESSION: Negative for pulmonary embolus.  No acute disease.  Calcified lymph nodes in the chest consistent with old granulomatous disease.   Electronically Signed   By: Inge Rise M.D.   On: 05/21/2016  17:44      ____________________________________________   PROCEDURES  Procedure(s) performed:   Procedures  Critical Care performed:   ____________________________________________   INITIAL IMPRESSION / ASSESSMENT AND PLAN / ED COURSE  Pertinent labs & imaging results that were available during my care of the patient were reviewed by me and considered in my medical decision making (see chart for details).   Clinical Course   ----------------------------------------- 8:06 PM on 05/21/2016 -----------------------------------------  Patient resting without any distress at this time. Very reassuring workup.  She is now saying that she is been having a cough as well as a runny nose. Possible viral infection as an etiology. Plan discharge home with follow-up with cardiology as well as for prescription for an inhaler. She is understanding of the plan and willing to comply. We reviewed the lab as well as imaging results.   ____________________________________________   FINAL CLINICAL IMPRESSION(S) / ED DIAGNOSES  Chest pain. Bronchitis.    NEW MEDICATIONS STARTED DURING THIS VISIT:  New Prescriptions   No medications on file     Note:  This document was prepared using Dragon voice recognition software and may include unintentional dictation errors.    Orbie Pyo, MD 05/21/16 2007

## 2016-05-30 ENCOUNTER — Ambulatory Visit (INDEPENDENT_AMBULATORY_CARE_PROVIDER_SITE_OTHER): Payer: 59 | Admitting: Internal Medicine

## 2016-05-30 VITALS — BP 118/78 | HR 72 | Ht 63.0 in | Wt 135.0 lb

## 2016-05-30 DIAGNOSIS — Z8679 Personal history of other diseases of the circulatory system: Secondary | ICD-10-CM

## 2016-05-30 DIAGNOSIS — R0981 Nasal congestion: Secondary | ICD-10-CM

## 2016-05-30 DIAGNOSIS — F5101 Primary insomnia: Secondary | ICD-10-CM | POA: Diagnosis not present

## 2016-05-30 DIAGNOSIS — F5104 Psychophysiologic insomnia: Secondary | ICD-10-CM

## 2016-05-30 DIAGNOSIS — R42 Dizziness and giddiness: Secondary | ICD-10-CM | POA: Diagnosis not present

## 2016-05-30 DIAGNOSIS — Z1231 Encounter for screening mammogram for malignant neoplasm of breast: Secondary | ICD-10-CM

## 2016-05-30 DIAGNOSIS — F418 Other specified anxiety disorders: Secondary | ICD-10-CM | POA: Diagnosis not present

## 2016-05-30 DIAGNOSIS — Z1239 Encounter for other screening for malignant neoplasm of breast: Secondary | ICD-10-CM

## 2016-05-30 MED ORDER — HYDROXYZINE HCL 10 MG PO TABS: 10.0000 mg | ORAL_TABLET | Freq: Three times a day (TID) | ORAL | 1 refills | Status: DC | PRN

## 2016-05-30 MED ORDER — FUROSEMIDE 20 MG PO TABS: 10.0000 mg | ORAL_TABLET | Freq: Every day | ORAL | 0 refills | Status: DC | PRN

## 2016-05-30 MED ORDER — OMEPRAZOLE 40 MG PO CPDR: 40.0000 mg | DELAYED_RELEASE_CAPSULE | Freq: Every day | ORAL | 5 refills | Status: DC

## 2016-05-30 MED ORDER — DULOXETINE HCL 60 MG PO CPEP: 60.0000 mg | ORAL_CAPSULE | Freq: Two times a day (BID) | ORAL | 5 refills | Status: DC

## 2016-05-30 MED ORDER — ALLOPURINOL 100 MG PO TABS: 100.0000 mg | ORAL_TABLET | Freq: Every day | ORAL | 5 refills | Status: DC

## 2016-05-30 MED ORDER — FEXOFENADINE HCL 180 MG PO TABS: 180.0000 mg | ORAL_TABLET | Freq: Every day | ORAL | 1 refills | Status: DC

## 2016-05-30 MED ORDER — ACYCLOVIR 200 MG PO CAPS: 400.0000 mg | ORAL_CAPSULE | Freq: Three times a day (TID) | ORAL | 5 refills | Status: DC

## 2016-05-30 MED ORDER — GLIPIZIDE ER 2.5 MG PO TB24: 2.5000 mg | ORAL_TABLET | Freq: Every day | ORAL | 5 refills | Status: DC

## 2016-05-30 MED ORDER — FLUTICASONE PROPIONATE 50 MCG/ACT NA SUSP: 2.0000 | Freq: Every day | NASAL | 0 refills | Status: DC

## 2016-05-30 MED ORDER — MONTELUKAST SODIUM 10 MG PO TABS: 10.0000 mg | ORAL_TABLET | Freq: Every day | ORAL | 5 refills | Status: DC

## 2016-05-30 MED ORDER — TRAZODONE HCL 50 MG PO TABS: 100.0000 mg | ORAL_TABLET | Freq: Every evening | ORAL | 2 refills | Status: DC | PRN

## 2016-05-30 MED ORDER — TIZANIDINE HCL 4 MG PO TABS: 4.0000 mg | ORAL_TABLET | Freq: Three times a day (TID) | ORAL | 1 refills | Status: DC | PRN

## 2016-05-30 MED ORDER — BISOPROLOL FUMARATE 10 MG PO TABS: 10.0000 mg | ORAL_TABLET | Freq: Every day | ORAL | 5 refills | Status: DC

## 2016-05-30 NOTE — Progress Notes (Signed)
Date:  05/30/2016   Name:  Sarah [name]   DOB:  1956/05/15   MRN:  [id number]   Chief Complaint: Chest Pain (ER FOLLOW UP - wants MAMMO); Dizziness; and Asthma (Wants spcer and has sores inside mouth from not rinsing after inhaler. ) Insomnia  Primary symptoms: sleep disturbance, premature morning awakening.  The problem occurs nightly. Past treatments include medication. The treatment provided moderate (trazodone 100 mg gives her 5 hours of sleep) relief.  Chest Pain   This is a recurrent problem. The pain is present in the substernal region. The pain is mild. The quality of the pain is described as heavy. The pain does not radiate. Associated symptoms include dizziness (chronic, intermittently worse) and shortness of breath. Pertinent negatives include no cough, diaphoresis, fever, orthopnea or palpitations. Treatments tried: albuterol inhaler.  Her past medical history is significant for CAD (and CHF). Prior diagnostic workup includes cardiac catheterization.   Allergies - has been told she had asthma in the past.  Also lots allergies for which she takes singulair.  She has not had much benefit from allegra in the past.  She has PND and clear nasal discharge.  Mild cough which is non productive.  Dizziness that is chronic but intermittently worse.  HM - overdue for mammogram.  No current breast issues.  MM - being followed and treated by the cancer center.  Still having diarrhea and low Mg despite supplementation.   Review of Systems  Constitutional: Negative for diaphoresis, fever and unexpected weight change.  HENT: Positive for postnasal drip, rhinorrhea and sinus pressure. Negative for tinnitus and trouble swallowing.   Eyes: Negative for visual disturbance.  Respiratory: Positive for chest tightness and shortness of breath. Negative for cough, wheezing and stridor.   Cardiovascular: Positive for chest pain. Negative for palpitations, orthopnea and leg swelling.    Gastrointestinal: Positive for abdominal distention and diarrhea. Negative for blood in stool.  Skin: Negative for color change and rash.  Allergic/Immunologic: Positive for environmental allergies.  Neurological: Positive for dizziness (chronic, intermittently worse) and light-headedness.  Psychiatric/Behavioral: Positive for sleep disturbance. The patient has insomnia.     Patient Active Problem List   Diagnosis Date Noted  . Environmental and seasonal allergies 04/19/2016  . Insomnia 04/19/2016  . Bicuspid aortic valve 04/19/2016  . Asthma 04/19/2016  . Aortic regurgitation 04/19/2016  . History of CHF (congestive heart failure) 04/19/2016  . DM (diabetes mellitus), type 2 with renal complications ([place]) 67/67/2094  . Anemia 04/12/2016  . GERD (gastroesophageal reflux disease) 04/12/2016  . Depression with anxiety 04/12/2016  . Irritable bowel syndrome (IBS) 04/12/2016  . Hepatic steatosis 04/12/2016  . Multiple myeloma in remission ([place]) 04/02/2016  . Abnormal stress test 02/14/2016  . 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 Disequilibrium 01/14/2013    Prior to Admission medications   Medication Sig Start Date End Date Taking? Authorizing Provider  acyclovir (ZOVIRAX) 200 MG capsule Take 2 capsules by mouth 3 (three) times daily.  03/24/16  Yes Historical Provider, MD  albuterol (PROVENTIL HFA;VENTOLIN HFA) 108 (90 Base) MCG/ACT inhaler Inhale 2 puffs into the lungs every 6 (six) hours as needed for wheezing or shortness of breath. 05/21/16  Yes Orbie Pyo, MD  allopurinol (ZYLOPRIM) 100 MG tablet Take by mouth. 02/04/16  Yes Historical Provider, MD  Biotin 1 MG CAPS Take by mouth.   Yes Historical Provider, MD  bisoprolol (ZEBETA) 10 MG tablet Take 1 tablet by mouth daily. 03/13/16  Yes  Historical Provider, MD  Blood Glucose Monitoring Suppl (FIFTY50 GLUCOSE METER 2.0) w/Device KIT  09/04/15  Yes Historical Provider, MD  Blood Glucose Monitoring  Suppl (ONE TOUCH ULTRA 2) w/Device KIT  09/04/15  Yes Historical Provider, MD  Calcium Carbonate-Vit D-Min (CALCIUM 600+D PLUS MINERALS) 600-400 MG-UNIT TABS Take by mouth.   Yes Historical Provider, MD  diphenoxylate-atropine (LOMOTIL) 2.5-0.025 MG tablet Take by mouth. 04/02/16  Yes Historical Provider, MD  DULoxetine (CYMBALTA) 60 MG capsule Take 1 capsule by mouth 2 (two) times daily. 03/06/16  Yes Historical Provider, MD  glipiZIDE (GLUCOTROL XL) 2.5 MG 24 hr tablet Take 1 tablet (2.5 mg total) by mouth daily with breakfast. 04/02/16  Yes Cammie Sickle, MD  glucose blood (ONE TOUCH ULTRA TEST) test strip  09/04/15  Yes Historical Provider, MD  HYDROcodone-ibuprofen (VICOPROFEN) 7.5-200 MG tablet Take 1 tablet by mouth every 8 (eight) hours as needed. Pain 04/02/16  Yes Cammie Sickle, MD  hydrOXYzine (ATARAX/VISTARIL) 10 MG tablet Take 10 mg by mouth 3 (three) times daily as needed.   Yes Historical Provider, MD  montelukast (SINGULAIR) 10 MG tablet Take by mouth. 03/23/16  Yes Historical Provider, MD  Multiple Vitamins-Minerals (HAIR/SKIN/NAILS/BIOTIN PO) Take 1 capsule by mouth 3 (three) times daily.   Yes Historical Provider, MD  omeprazole (PRILOSEC) 40 MG capsule Take 40 mg by mouth. 04/09/16  Yes Historical Provider, MD  Jonetta Speak LANCETS [address] [place]  09/04/15  Yes Historical Provider, MD  promethazine (PHENERGAN) 25 MG tablet Take by mouth. 02/09/16  Yes Historical Provider, MD  REVLIMID 5 MG capsule Take 1 capsule (5 mg total) by mouth daily. X 21 days.  1 week off 04/18/16  Yes Cammie Sickle, MD  tiZANidine (ZANAFLEX) 4 MG tablet Take 1 tablet by mouth 3 (three) times daily as needed. Muscle relaxer for back pain 03/23/16  Yes Historical Provider, MD  traZODone (DESYREL) 50 MG tablet Take 1-2 tablets (50-100 mg total) by mouth at bedtime as needed for sleep. 04/19/16  Yes Glean Hess, MD  Zoledronic Acid (ZOMETA IV) Inject into the vein every 30 (thirty) days.   Yes  Historical Provider, MD  Calcium Carbonate-Vitamin D (CALTRATE 600+D) 600-400 MG-UNIT tablet Take 1 tablet by mouth daily.    Historical Provider, MD    Allergies  Allergen Reactions  . Oxycodone-Acetaminophen   . Benadryl [Diphenhydramine] Palpitations  . Morphine And Related Itching and Rash  . Ondansetron     CAUSES PATIENT DIARRHEA  . Tylenol [Acetaminophen] Itching and Rash  . Zofran [Ondansetron Hcl] Diarrhea    Past Surgical History:  Procedure Laterality Date  . 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 CARDIAC ELECTROPHYSIOLOGY MAPPING AND ABLATION    . CARPAL TUNNEL RELEASE Bilateral   . CHOLECYSTECTOMY  2008  . FOOT SURGERY Bilateral   . INCONTINENCE SURGERY  2009  . PARTIAL HYSTERECTOMY  03/1996   fibroids  . TONSILLECTOMY  2007    Social History  Substance Use Topics  . Smoking status: Former Smoker    Packs/day: 1.00    Years: 20.00    Types: Cigarettes    Quit date: 07/02/1991  . Smokeless tobacco: Never Used  . Alcohol use 1.2 oz/week    1 Glasses of wine, 1 Cans of beer per week     Comment: occassional     Medication list has been reviewed and updated.   Physical Exam  Constitutional: She appears well-developed.  HENT:  Right Ear: Tympanic membrane and ear canal  normal.  Left Ear: Tympanic membrane and ear canal normal.  Nose: Right sinus exhibits maxillary sinus tenderness. Right sinus exhibits no frontal sinus tenderness. Left sinus exhibits maxillary sinus tenderness. Left sinus exhibits no frontal sinus tenderness.  Mouth/Throat: No oropharyngeal exudate, posterior oropharyngeal edema or posterior oropharyngeal erythema.  Eyes: Right eye exhibits nystagmus. Left eye exhibits nystagmus.  Cardiovascular: Normal rate and regular rhythm.   Pulmonary/Chest: Effort normal. No accessory muscle usage. No respiratory distress. She has no wheezes. She has no rhonchi.  Abdominal: Soft. Normal appearance.  Neurological: She is alert.  Psychiatric: Her  speech is normal. Her mood appears anxious.    BP 118/78   Pulse 72   Ht 5' 3"  (1.6 m)   Wt 135 lb (61.2 kg)   SpO2 100%   BMI 23.91 kg/m   Assessment and Plan: 1. Disequilibrium Chronic, suggestive of vestibular disease, possibly worsened by sinus congestion/allergies Continue singulair Add flonase and allegra - fexofenadine (ALLEGRA) 180 MG tablet; Take 1 tablet (180 mg total) by mouth daily.  Dispense: 30 tablet; Refill: 1 - Ambulatory referral to ENT  2. Depression with anxiety Continue cymbalta  3. Primary insomnia May increase trazodone to 150 mg  4. Sinus congestion - fluticasone (FLONASE) 50 MCG/ACT nasal spray; Place 2 sprays into both nostrils daily.  Dispense: 16 g; Refill: 0  5. Breast cancer screening - [facility] DIGITAL SCREENING BILATERAL; Future  6. History of CHF (congestive heart failure) Start low dose Lasix qod - Ambulatory referral to Cardiology  7. Psychophysiological insomnia - traZODone (DESYREL) 50 MG tablet; Take 2-3 tablets (100-150 mg total) by mouth at bedtime as needed for sleep.  Dispense: 90 tablet; Refill: [place]  05/30/2016

## 2016-05-30 NOTE — Patient Instructions (Signed)
Breast Self-Awareness Introduction Breast self-awareness means being familiar with how your breasts look and feel. It involves checking your breasts regularly and reporting any changes to your health care provider. Practicing breast self-awareness is important. A change in your breasts can be a sign of a serious medical problem. Being familiar with how your breasts look and feel allows you to find any problems early, when treatment is more likely to be successful. All women should practice breast self-awareness, including women who have had breast implants. How to do a breast self-exam One way to learn what is normal for your breasts and whether your breasts are changing is to do a breast self-exam. To do a breast self-exam: Look for Changes  1. Remove all the clothing above your waist. 2. Stand in front of a mirror in a room with good lighting. 3. Put your hands on your hips. 4. Push your hands firmly downward. 5. Compare your breasts in the mirror. Look for differences between them (asymmetry), such as:  Differences in shape.  Differences in size.  Puckers, dips, and bumps in one breast and not the other. 6. Look at each breast for changes in your skin, such as:  Redness.  Scaly areas. 7. Look for changes in your nipples, such as:  Discharge.  Bleeding.  Dimpling.  Redness.  A change in position. Feel for Changes  Carefully feel your breasts for lumps and changes. It is best to do this while lying on your back on the floor and again while sitting or standing in the shower or tub with soapy water on your skin. Feel each breast in the following way:  Place the arm on the side of the breast you are examining above your head.  Feel your breast with the other hand.  Start in the nipple area and make  inch (2 cm) overlapping circles to feel your breast. Use the pads of your three middle fingers to do this. Apply light pressure, then medium pressure, then firm pressure. The light  pressure will allow you to feel the tissue closest to the skin. The medium pressure will allow you to feel the tissue that is a little deeper. The firm pressure will allow you to feel the tissue close to the ribs.  Continue the overlapping circles, moving downward over the breast until you feel your ribs below your breast.  Move one finger-width toward the center of the body. Continue to use the  inch (2 cm) overlapping circles to feel your breast as you move slowly up toward your collarbone.  Continue the up and down exam using all three pressures until you reach your armpit. Write Down What You Find  Write down what is normal for each breast and any changes that you find. Keep a written record with breast changes or normal findings for each breast. By writing this information down, you do not need to depend only on memory for size, tenderness, or location. Write down where you are in your menstrual cycle, if you are still menstruating. If you are having trouble noticing differences in your breasts, do not get discouraged. With time you will become more familiar with the variations in your breasts and more comfortable with the exam. How often should I examine my breasts? Examine your breasts every month. If you are breastfeeding, the best time to examine your breasts is after a feeding or after using a breast pump. If you menstruate, the best time to examine your breasts is 5-7 days after your  period is over. During your period, your breasts are lumpier, and it may be more difficult to notice changes. When should I see my health care provider? See your health care provider if you notice:  A change in shape or size of your breasts or nipples.  A change in the skin of your breast or nipples, such as a reddened or scaly area.  Unusual discharge from your nipples.  A lump or thick area that was not there before.  Pain in your breasts.  Anything that concerns you. This information is not  intended to replace advice given to you by your health care provider. Make sure you discuss any questions you have with your health care provider. Document Released: 06/17/2005 Document Revised: 11/23/2015 Document Reviewed: 05/07/2015  2017 Elsevier

## 2016-06-04 ENCOUNTER — Other Ambulatory Visit: Payer: 59

## 2016-06-04 ENCOUNTER — Ambulatory Visit: Payer: 59 | Admitting: Internal Medicine

## 2016-06-04 ENCOUNTER — Ambulatory Visit: Payer: 59

## 2016-06-05 DIAGNOSIS — I110 Hypertensive heart disease with heart failure: Secondary | ICD-10-CM | POA: Insufficient documentation

## 2016-06-05 DIAGNOSIS — I5032 Chronic diastolic (congestive) heart failure: Secondary | ICD-10-CM | POA: Insufficient documentation

## 2016-06-05 DIAGNOSIS — I495 Sick sinus syndrome: Secondary | ICD-10-CM

## 2016-06-05 DIAGNOSIS — I350 Nonrheumatic aortic (valve) stenosis: Secondary | ICD-10-CM

## 2016-06-05 DIAGNOSIS — Z9889 Other specified postprocedural states: Secondary | ICD-10-CM | POA: Insufficient documentation

## 2016-06-05 HISTORY — DX: Nonrheumatic aortic (valve) stenosis: I35.0

## 2016-06-05 HISTORY — DX: Other specified postprocedural states: Z98.890

## 2016-06-05 HISTORY — DX: Sick sinus syndrome: I49.5

## 2016-06-14 ENCOUNTER — Other Ambulatory Visit: Payer: Self-pay | Admitting: Internal Medicine

## 2016-06-18 ENCOUNTER — Inpatient Hospital Stay: Payer: 59

## 2016-06-18 ENCOUNTER — Telehealth: Payer: Self-pay | Admitting: *Deleted

## 2016-06-18 ENCOUNTER — Inpatient Hospital Stay: Payer: 59 | Attending: Internal Medicine

## 2016-06-18 ENCOUNTER — Inpatient Hospital Stay (HOSPITAL_BASED_OUTPATIENT_CLINIC_OR_DEPARTMENT_OTHER): Payer: 59 | Admitting: Internal Medicine

## 2016-06-18 VITALS — BP 119/75 | HR 71 | Temp 98.8°F | Resp 16 | Wt 139.0 lb

## 2016-06-18 DIAGNOSIS — N183 Chronic kidney disease, stage 3 (moderate): Secondary | ICD-10-CM | POA: Insufficient documentation

## 2016-06-18 DIAGNOSIS — N281 Cyst of kidney, acquired: Secondary | ICD-10-CM | POA: Insufficient documentation

## 2016-06-18 DIAGNOSIS — D696 Thrombocytopenia, unspecified: Secondary | ICD-10-CM

## 2016-06-18 DIAGNOSIS — C9001 Multiple myeloma in remission: Secondary | ICD-10-CM | POA: Diagnosis not present

## 2016-06-18 DIAGNOSIS — I10 Essential (primary) hypertension: Secondary | ICD-10-CM | POA: Diagnosis not present

## 2016-06-18 DIAGNOSIS — G629 Polyneuropathy, unspecified: Secondary | ICD-10-CM

## 2016-06-18 DIAGNOSIS — R197 Diarrhea, unspecified: Secondary | ICD-10-CM

## 2016-06-18 DIAGNOSIS — G8929 Other chronic pain: Secondary | ICD-10-CM | POA: Insufficient documentation

## 2016-06-18 DIAGNOSIS — M109 Gout, unspecified: Secondary | ICD-10-CM | POA: Diagnosis not present

## 2016-06-18 DIAGNOSIS — F329 Major depressive disorder, single episode, unspecified: Secondary | ICD-10-CM | POA: Insufficient documentation

## 2016-06-18 DIAGNOSIS — Z87891 Personal history of nicotine dependence: Secondary | ICD-10-CM

## 2016-06-18 DIAGNOSIS — E119 Type 2 diabetes mellitus without complications: Secondary | ICD-10-CM

## 2016-06-18 DIAGNOSIS — M129 Arthropathy, unspecified: Secondary | ICD-10-CM

## 2016-06-18 DIAGNOSIS — I509 Heart failure, unspecified: Secondary | ICD-10-CM | POA: Diagnosis not present

## 2016-06-18 DIAGNOSIS — Z800 Family history of malignant neoplasm of digestive organs: Secondary | ICD-10-CM

## 2016-06-18 DIAGNOSIS — K219 Gastro-esophageal reflux disease without esophagitis: Secondary | ICD-10-CM | POA: Insufficient documentation

## 2016-06-18 DIAGNOSIS — R42 Dizziness and giddiness: Secondary | ICD-10-CM | POA: Insufficient documentation

## 2016-06-18 DIAGNOSIS — Z23 Encounter for immunization: Secondary | ICD-10-CM | POA: Diagnosis not present

## 2016-06-18 DIAGNOSIS — F419 Anxiety disorder, unspecified: Secondary | ICD-10-CM

## 2016-06-18 DIAGNOSIS — Z801 Family history of malignant neoplasm of trachea, bronchus and lung: Secondary | ICD-10-CM

## 2016-06-18 DIAGNOSIS — K760 Fatty (change of) liver, not elsewhere classified: Secondary | ICD-10-CM | POA: Diagnosis not present

## 2016-06-18 DIAGNOSIS — Z79899 Other long term (current) drug therapy: Secondary | ICD-10-CM | POA: Diagnosis not present

## 2016-06-18 DIAGNOSIS — I129 Hypertensive chronic kidney disease with stage 1 through stage 4 chronic kidney disease, or unspecified chronic kidney disease: Secondary | ICD-10-CM

## 2016-06-18 DIAGNOSIS — Z9484 Stem cells transplant status: Secondary | ICD-10-CM

## 2016-06-18 DIAGNOSIS — Z803 Family history of malignant neoplasm of breast: Secondary | ICD-10-CM | POA: Insufficient documentation

## 2016-06-18 DIAGNOSIS — M549 Dorsalgia, unspecified: Secondary | ICD-10-CM | POA: Diagnosis not present

## 2016-06-18 LAB — CBC WITH DIFFERENTIAL/PLATELET
BASOS PCT: 1 %
Basophils Absolute: 0 10*3/uL (ref 0–0.1)
EOS ABS: 0.2 10*3/uL (ref 0–0.7)
Eosinophils Relative: 6 %
HCT: 33.9 % — ABNORMAL LOW (ref 35.0–47.0)
HEMOGLOBIN: 11.8 g/dL — AB (ref 12.0–16.0)
LYMPHS ABS: 0.8 10*3/uL — AB (ref 1.0–3.6)
Lymphocytes Relative: 23 %
MCH: 31.7 pg (ref 26.0–34.0)
MCHC: 35 g/dL (ref 32.0–36.0)
MCV: 90.6 fL (ref 80.0–100.0)
Monocytes Absolute: 0.3 10*3/uL (ref 0.2–0.9)
Monocytes Relative: 8 %
NEUTROS PCT: 62 %
Neutro Abs: 2.1 10*3/uL (ref 1.4–6.5)
Platelets: 110 10*3/uL — ABNORMAL LOW (ref 150–440)
RBC: 3.74 MIL/uL — AB (ref 3.80–5.20)
RDW: 14.8 % — ABNORMAL HIGH (ref 11.5–14.5)
WBC: 3.4 10*3/uL — AB (ref 3.6–11.0)

## 2016-06-18 LAB — COMPREHENSIVE METABOLIC PANEL
ALBUMIN: 4.5 g/dL (ref 3.5–5.0)
ALK PHOS: 77 U/L (ref 38–126)
ALT: 27 U/L (ref 14–54)
AST: 26 U/L (ref 15–41)
Anion gap: 9 (ref 5–15)
BUN: 25 mg/dL — ABNORMAL HIGH (ref 6–20)
CALCIUM: 8.2 mg/dL — AB (ref 8.9–10.3)
CO2: 25 mmol/L (ref 22–32)
CREATININE: 1.25 mg/dL — AB (ref 0.44–1.00)
Chloride: 102 mmol/L (ref 101–111)
GFR calc Af Amer: 53 mL/min — ABNORMAL LOW (ref >60)
GFR calc non Af Amer: 46 mL/min — ABNORMAL LOW (ref >60)
GLUCOSE: 215 mg/dL — AB (ref 65–99)
Potassium: 3.4 mmol/L — ABNORMAL LOW (ref 3.5–5.1)
SODIUM: 136 mmol/L (ref 135–145)
Total Bilirubin: 0.5 mg/dL (ref 0.3–1.2)
Total Protein: 7 g/dL (ref 6.5–8.1)

## 2016-06-18 LAB — MAGNESIUM: MAGNESIUM: 0.8 mg/dL — AB (ref 1.7–2.4)

## 2016-06-18 MED ORDER — CHOLESTYRAMINE 4 G PO PACK: PACK | ORAL | 3 refills | Status: DC

## 2016-06-18 MED ORDER — MAGNESIUM SULFATE 4 GM/100ML IV SOLN
4.0000 g | Freq: Once | INTRAVENOUS | Status: AC
Start: 2016-06-18 — End: 2016-06-18
  Administered 2016-06-18: 4 g via INTRAVENOUS
  Filled 2016-06-18: qty 100

## 2016-06-18 MED ORDER — ZOLEDRONIC ACID 4 MG/100ML IV SOLN
4.0000 mg | Freq: Once | INTRAVENOUS | Status: AC
  Administered 2016-06-18: 4 mg via INTRAVENOUS

## 2016-06-18 MED ORDER — INFLUENZA VAC SPLIT QUAD 0.5 ML IM SUSY
0.5000 mL | PREFILLED_SYRINGE | INTRAMUSCULAR | Status: AC
  Administered 2016-06-18: 0.5 mL via INTRAMUSCULAR

## 2016-06-18 MED ORDER — SODIUM CHLORIDE 0.9 % IV SOLN
Freq: Once | INTRAVENOUS | Status: AC
  Administered 2016-06-18: 13:00:00 via INTRAVENOUS
  Filled 2016-06-18: qty 1000

## 2016-06-18 NOTE — Telephone Encounter (Signed)
Sarah [name] from the Lab called with critical Magnesium of 0.8. @ 1:37pm.  Notified Dr B.  Patient still in infusion, Dr B ordered IV magnesium to be administered.  Notified infusion to administer.

## 2016-06-18 NOTE — Progress Notes (Signed)
Patient here today for follow up on Multiple myeloma in remission

## 2016-06-18 NOTE — Progress Notes (Signed)
[name]  Patient Care Team: Glean Hess, MD as PCP - General (Family Medicine) Cammie Sickle, MD as Consulting Physician (Oncology)  CHIEF COMPLAINTS/PURPOSE OF CONSULTATION:  Multiple myeloma  # MULTIPLE MYELOMA-   Oncology History   # June 2017- [facility] MULTIPLE MYELOMA [BMBx- 80% plasma cells; [facility]]; Lamda light chain 1340; s/p RVD   # 14th DEC 2016- Auto-Stem cell transplant [[facility];Dr.Hertzig]; rev [dose reduced sec to Idamay  # Maintenance Revlimid- 15m 3 w-ON & 1 week OFF  # Bone lesions-numerous ~528mlytic lesions in Thoracic spine- on Zometa  # June 2016- Proteinuria [3gm/day]/ CKD III   # chronic back pain/ anxiety/ PN   # "Colitis" s/p multiple EGD/Colonoscopies     Multiple myeloma in remission ([facility]  04/02/2016 Initial Diagnosis    Multiple myeloma in remission ([facility]      HISTORY OF PRESENTING ILLNESS:  ChKaruna Balducci89.[email or web address].  female of her history of multiple myeloma diagnosed June 2016 currently on maintenance Revlimid after bone marrow transplant Is here for follow-up.  In the interim patient has been evaluated [facility] bone marrow transplant team. Reviewed the records.  Patient continues to chronic diarrhea not any worse.  She continues to chronic back pain. Chronic anxiety issues. Not any better. No bleeding.  She has chronic diarrhea for which she has been taking Lomotil as needed. Although she has chronic probable irritable bowel syndrome. Chronic tingling and numbness of extremities. Not any worse.  ROS: A complete 10 point review of system is done which is negative except mentioned above in history of present illness  MEDICAL HISTORY:  Past Medical History:  Diagnosis Date  . Anemia   . Anxiety   . Arthritis   . Bicuspid aortic valve   . CHF (congestive heart failure) ([facility]  . CKD (chronic kidney disease) stage 3, GFR 30-59 ml/min   . Depression   .  Diabetes mellitus ([facility]  . Dizziness   . Fatty liver   . Frequent falls   . GERD (gastroesophageal reflux disease)   . Gout   . Heart murmur   . History of blood transfusion   . History of bone marrow transplant ([facility]  . History of uterine fibroid   . Hypertension   . Hypomagnesemia   . Multiple myeloma ([facility]  . Renal cyst     SURGICAL HISTORY: Past Surgical History:  Procedure Laterality Date  . 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 CARDIAC ELECTROPHYSIOLOGY MAPPING AND ABLATION    . CARPAL TUNNEL RELEASE Bilateral   . CHOLECYSTECTOMY  2008  . FOOT SURGERY Bilateral   . INCONTINENCE SURGERY  2009  . PARTIAL HYSTERECTOMY  03/1996   fibroids  . TONSILLECTOMY  2007    [place] quit smoking [place]; occasional alcohol; lives [place] with step sister.  Social History   Social History  . Marital status: Widowed    Spouse name: N/A  . Number of children: N/A  . Years of education: N/A   Occupational History  . Not on file.   Social History Main Topics  . Smoking status: Former Smoker    Packs/day: 1.00    Years: 20.00    Types: Cigarettes    Quit date: 07/02/1991  . Smokeless tobacco: Never Used  . Alcohol use 1.2 oz/week    1 Glasses of wine, 1 Cans of beer  per week     Comment: occassional  . Drug use: No  . Sexual activity: No   Other Topics Concern  . Not on file   Social History Narrative  . No narrative on file    FAMILY HISTORY: Family History  Problem Relation Age of Onset  . Colon cancer Father   . Renal Disease Father   . Diabetes Mellitus II Father   . Melanoma Paternal Grandmother   . Breast cancer Maternal Aunt   . Anemia Mother   . Heart disease Mother   . Heart failure Mother   . Renal Disease Mother   . Congestive Heart Failure Mother   . Heart disease Maternal Uncle   . Throat cancer Maternal Uncle   . Lung cancer Maternal Uncle   . Liver disease Maternal Uncle   . Heart failure  Maternal Uncle   . Hearing loss Son 18    Suicide     ALLERGIES:  is allergic to oxycodone-acetaminophen; benadryl [diphenhydramine]; morphine and related; ondansetron; tylenol [acetaminophen]; and zofran [ondansetron hcl].  MEDICATIONS:  Current Outpatient Prescriptions  Medication Sig Dispense Refill  . acyclovir (ZOVIRAX) 200 MG capsule Take 2 capsules (400 mg total) by mouth 3 (three) times daily. 180 capsule 5  . allopurinol (ZYLOPRIM) 100 MG tablet Take 1 tablet (100 mg total) by mouth daily. 30 tablet 5  . Biotin 1 MG CAPS Take by mouth.    . bisoprolol (ZEBETA) 10 MG tablet Take 1 tablet (10 mg total) by mouth daily. 30 tablet 5  . Blood Glucose Monitoring Suppl (FIFTY50 GLUCOSE METER 2.0) w/Device KIT     . Blood Glucose Monitoring Suppl (ONE TOUCH ULTRA 2) w/Device KIT     . Calcium Carbonate-Vit D-Min (CALCIUM 600+D PLUS MINERALS) 600-400 MG-UNIT TABS Take by mouth.    . Calcium Carbonate-Vitamin D (CALTRATE 600+D) 600-400 MG-UNIT tablet Take 1 tablet by mouth daily.    . diphenoxylate-atropine (LOMOTIL) 2.5-0.025 MG tablet Take 2 tablets by mouth daily as needed for diarrhea or loose stools.     . DULoxetine (CYMBALTA) 60 MG capsule TAKE 1 CAPSULE(60 MG) BY MOUTH TWICE DAILY 60 capsule 0  . fexofenadine (ALLEGRA) 180 MG tablet Take 1 tablet (180 mg total) by mouth daily. 30 tablet 1  . fluticasone (FLONASE) 50 MCG/ACT nasal spray Place 2 sprays into both nostrils daily. 16 g 0  . furosemide (LASIX) 20 MG tablet Take 0.5 tablets (10 mg total) by mouth daily as needed. 30 tablet 0  . glucose blood (ONE TOUCH ULTRA TEST) test strip     . HYDROcodone-ibuprofen (VICOPROFEN) 7.5-200 MG tablet Take 1 tablet by mouth every 8 (eight) hours as needed. Pain 30 tablet 0  . hydrOXYzine (ATARAX/VISTARIL) 10 MG tablet Take 1 tablet (10 mg total) by mouth 3 (three) times daily as needed. 90 tablet 1  . montelukast (SINGULAIR) 10 MG tablet Take 1 tablet (10 mg total) by mouth at bedtime. 30  tablet 5  . Multiple Vitamins-Minerals (HAIR/SKIN/NAILS/BIOTIN PO) Take 1 capsule by mouth 3 (three) times daily.    Marland Kitchen omeprazole (PRILOSEC) 40 MG capsule Take 1 capsule (40 mg total) by mouth daily. (Patient taking differently: Take 40 mg by mouth 2 (two) times daily. ) 30 capsule 5  . ONETOUCH DELICA LANCETS 67T MISC     . promethazine (PHENERGAN) 25 MG tablet Take 25 mg by mouth as needed.     Marland Kitchen REVLIMID 5 MG capsule Take 1 capsule (5 mg total) by mouth daily. X  21 days.  1 week off 21 capsule 4  . tiZANidine (ZANAFLEX) 4 MG tablet Take 1 tablet (4 mg total) by mouth every 8 (eight) hours as needed. Muscle relaxer for back pain 90 tablet 1  . traZODone (DESYREL) 50 MG tablet Take 2-3 tablets (100-150 mg total) by mouth at bedtime as needed for sleep. 90 tablet 2  . Zoledronic Acid (ZOMETA IV) Inject into the vein every 30 (thirty) days.    . cholestyramine (QUESTRAN) 4 g packet Take 4 g once daily. 60 each 3   No current facility-administered medications for this visit.       Marland Kitchen  PHYSICAL EXAMINATION: ECOG PERFORMANCE STATUS: 1 - Symptomatic but completely ambulatory  Vitals:   06/18/16 1138  BP: 119/75  Pulse: 71  Resp: 16  Temp: 98.8 F (37.1 C)   Filed Weights   06/18/16 1138  Weight: 139 lb (63 kg)    GENERAL: Well-nourished well-developed; Alert, no distress and comfortable.  EYES: no pallor or icterus OROPHARYNX: no thrush or ulceration; good dentition  NECK: supple, no masses felt LYMPH:  no palpable lymphadenopathy in the cervical, axillary or inguinal regions LUNGS: clear to auscultation and  No wheeze or crackles HEART/CVS: regular rate & rhythm and no murmurs; No lower extremity edema ABDOMEN: abdomen soft, non-tender and normal bowel sounds Musculoskeletal:no cyanosis of digits and no clubbing  PSYCH: alert & oriented x 3 with fluent speech NEURO: no focal motor/sensory deficits SKIN:  no rashes or significant lesions  LABORATORY DATA:  I have reviewed the  data as listed Lab Results  Component Value Date   WBC 3.4 (L) 06/18/2016   HGB 11.8 (L) 06/18/2016   HCT 33.9 (L) 06/18/2016   MCV 90.6 06/18/2016   PLT 110 (L) 06/18/2016    Recent Labs  04/02/16 1603  05/07/16 0952 05/21/16 1619 06/18/16 1115  NA 140  < > 138 137 136  K 3.6  < > 3.7 3.6 3.4*  CL 107  < > 105 103 102  CO2 24  < > 24 23 25   GLUCOSE 83  < > 122* 103* 215*  BUN 18  < > 18 16 25*  CREATININE 0.91  < > 1.03* 1.13* 1.25*  CALCIUM 8.5*  < > 8.9 8.4* 8.2*  GFRNONAA >60  < > 58* 52* 46*  GFRAA >60  < > >60 >60 53*  PROT 7.5  --   --  7.1 7.0  ALBUMIN 4.7  --   --  4.5 4.5  AST 30  --   --  33 26  ALT 26  --   --  31 27  ALKPHOS 91  --   --  87 77  BILITOT 0.6  --   --  0.6 0.5  < > = values in this interval not displayed.  RADIOGRAPHIC STUDIES: I have personally reviewed the radiological images as listed and agreed with the findings in the report. Dg Chest 1 View  Result Date: 05/21/2016 CLINICAL DATA:  Intermittent chest discomfort and heaviness, shortness of breath EXAM: CHEST 1 VIEW COMPARISON:  None. FINDINGS: The heart size and mediastinal contours are within normal limits. Possible left suprahilar calcified lymph node. Both lungs are clear. The visualized skeletal structures are unremarkable. IMPRESSION: No active disease. Electronically Signed   By: Donavan Foil M.D.   On: 05/21/2016 16:46   Ct Angio Chest Pe W And/or Wo Contrast  Result Date: 05/21/2016 CLINICAL DATA:  Chest discomfort, shortness of breath and dizziness  today. EXAM: CT ANGIOGRAPHY CHEST WITH CONTRAST TECHNIQUE: Multidetector CT imaging of the chest was performed using the standard protocol during bolus administration of intravenous contrast. Multiplanar CT image reconstructions and MIPs were obtained to evaluate the vascular anatomy. CONTRAST:  75 cc Isovue 370. COMPARISON:  Single-view of the chest today appear FINDINGS: Cardiovascular: No pulmonary embolus is identified. Heart size is  normal. No pericardial effusion. Mediastinum/Nodes: Calcified mediastinal and hilar lymph nodes are consistent with old granulomatous disease. No pathologically enlarged lymph nodes are identified. The thyroid gland is unremarkable. No hiatal hernia. Lungs/Pleura: No pleural effusion.  The lungs are clear. Upper Abdomen: Status post cholecystectomy. Musculoskeletal: Lucent lesion in the left humeral head has a well-defined border and is most consistent with a degenerative cyst. Review of the MIP images confirms the above findings. IMPRESSION: Negative for pulmonary embolus.  No acute disease. Calcified lymph nodes in the chest consistent with old granulomatous disease. Electronically Signed   By: Inge Rise M.D.   On: 05/21/2016 17:44    ASSESSMENT & PLAN:   Multiple myeloma in remission ([place]) Multiple myeloma IgA lambda as per the records status post autologous stem cell transplant in December 2016 currently on Revlimid 12m 3 w On & 1 week OFF. Clinically no evidence of progression at this time. OCT 2017- M- protein- NEGATIVE/ K-L=Normal. Skeletal survey- Normal.   # Chronic diarrhea- question Revlimid recommend trial of cholestyramine; along with lomotil. New script given.   # Chronic mild-mod thrombocytopenia 113/ ? Revlimid vs others. Monitor for now.   # Bone lesions recommend Zometa every 4 weeks; Ca- 8.5 today. Ca+ vit D BID.   #  Hypomagnesemia- 0.8 g. Recommend IV mag.  # Chronic back pain- pain management follow-up  # okay with you shortly today.; [facility]  # every 4 weeks/ BMP and magnesium/ zometa; follow up with me in 3 months/zometa; labs- myeloma panel/ka-lam 1 week prior.    GCammie Sickle MD 06/18/2016 1:39 PM

## 2016-06-18 NOTE — Assessment & Plan Note (Addendum)
Multiple myeloma IgA lambda as per the records status post autologous stem cell transplant in December 2016 currently on Revlimid 78m 3 w On & 1 week OFF. Clinically no evidence of progression at this time. OCT 2017- M- protein- NEGATIVE/ K-L=Normal. Skeletal survey- Normal.   # Chronic diarrhea- question Revlimid recommend trial of cholestyramine; along with lomotil. New script given.   # Chronic mild-mod thrombocytopenia 113/ ? Revlimid vs others. Monitor for now.   # Bone lesions recommend Zometa every 4 weeks; Ca- 8.5 today. Ca+ vit D BID.   #  Hypomagnesemia- 0.8 g. Recommend IV mag.  # Chronic back pain- pain management follow-up  # okay with Flu today.; [facility]  Will starting immunizations starting Jan 2018. .  # every 4 weeks/ BMP and magnesium/ zometa; follow up with me in 3 months/zometa; labs- myeloma panel/ka-lam 1 week prior.

## 2016-06-19 LAB — KAPPA/LAMBDA LIGHT CHAINS
KAPPA FREE LGHT CHN: 17.1 mg/L (ref 3.3–19.4)
Kappa, lambda light chain ratio: 1.08 (ref 0.26–1.65)
Lambda free light chains: 15.8 mg/L (ref 5.7–26.3)

## 2016-06-20 LAB — MULTIPLE MYELOMA PANEL, SERUM
ALBUMIN/GLOB SERPL: 1.8 — AB (ref 0.7–1.7)
ALPHA2 GLOB SERPL ELPH-MCNC: 0.8 g/dL (ref 0.4–1.0)
Albumin SerPl Elph-Mcnc: 4.1 g/dL (ref 2.9–4.4)
Alpha 1: 0.2 g/dL (ref 0.0–0.4)
B-GLOBULIN SERPL ELPH-MCNC: 0.9 g/dL (ref 0.7–1.3)
Gamma Glob SerPl Elph-Mcnc: 0.6 g/dL (ref 0.4–1.8)
Globulin, Total: 2.4 g/dL (ref 2.2–3.9)
IGG (IMMUNOGLOBIN G), SERUM: 520 mg/dL — AB (ref 700–1600)
IgA: 131 mg/dL (ref 87–352)
IgM, Serum: 18 mg/dL — ABNORMAL LOW (ref 26–217)
Total Protein ELP: 6.5 g/dL (ref 6.0–8.5)

## 2016-06-25 ENCOUNTER — Other Ambulatory Visit: Payer: Self-pay | Admitting: *Deleted

## 2016-06-25 ENCOUNTER — Other Ambulatory Visit: Payer: Self-pay | Admitting: Oncology

## 2016-06-25 ENCOUNTER — Telehealth: Payer: Self-pay | Admitting: Internal Medicine

## 2016-06-25 DIAGNOSIS — L989 Disorder of the skin and subcutaneous tissue, unspecified: Secondary | ICD-10-CM

## 2016-06-25 DIAGNOSIS — C9001 Multiple myeloma in remission: Secondary | ICD-10-CM

## 2016-06-25 MED ORDER — REVLIMID 5 MG PO CAPS: 5.0000 mg | ORAL_CAPSULE | Freq: Every day | ORAL | 4 refills | Status: DC

## 2016-06-25 NOTE — Telephone Encounter (Signed)
Contacted pt- RF request can be submitted for her revlimid. [employer] survey auth completed. Josem Kaufmann # [id number])  RN Confirmed pt still received drugs from [facility].  RN Advised pt to ask her pcp - Dr. Carolin Coy for referral to dermatologist for skin assessment.  She will contact Dr. Carolin Coy for the referral.  Dr. Janese Banks, will need to cosign behind Revlmid RF order

## 2016-06-25 NOTE — Telephone Encounter (Signed)
She lvm in [place] stating that [facility] is waiting on Rx from Dr. B for her Revlimid and that she can't get it until they receive it. Also she wants a referral to a dermatologist because she has a darkening spot on her leg that is becoming painful and skin cancer runs in her family. Please call pt.

## 2016-07-08 DIAGNOSIS — Z0289 Encounter for other administrative examinations: Secondary | ICD-10-CM | POA: Insufficient documentation

## 2016-07-08 DIAGNOSIS — M47816 Spondylosis without myelopathy or radiculopathy, lumbar region: Secondary | ICD-10-CM | POA: Insufficient documentation

## 2016-07-08 DIAGNOSIS — G894 Chronic pain syndrome: Secondary | ICD-10-CM | POA: Insufficient documentation

## 2016-07-08 HISTORY — DX: Chronic pain syndrome: G89.4

## 2016-07-09 ENCOUNTER — Inpatient Hospital Stay: Payer: Managed Care, Other (non HMO) | Attending: Internal Medicine

## 2016-07-09 ENCOUNTER — Other Ambulatory Visit: Payer: Self-pay | Admitting: Internal Medicine

## 2016-07-09 ENCOUNTER — Other Ambulatory Visit: Payer: Self-pay | Admitting: Otolaryngology

## 2016-07-09 DIAGNOSIS — Z23 Encounter for immunization: Secondary | ICD-10-CM | POA: Diagnosis not present

## 2016-07-09 DIAGNOSIS — C9001 Multiple myeloma in remission: Secondary | ICD-10-CM | POA: Diagnosis present

## 2016-07-09 DIAGNOSIS — C9011 Plasma cell leukemia in remission: Secondary | ICD-10-CM | POA: Insufficient documentation

## 2016-07-09 DIAGNOSIS — H814 Vertigo of central origin: Secondary | ICD-10-CM

## 2016-07-09 MED ORDER — POLIOVIRUS VACCINE INACTIVATED IJ INJ
0.5000 mL | INJECTION | Freq: Once | INTRAMUSCULAR | Status: AC
  Administered 2016-07-09: 0.5 mL via SUBCUTANEOUS
  Filled 2016-07-09: qty 5

## 2016-07-09 MED ORDER — HEPATITIS B VAC RECOMBINANT 20 MCG/ML IJ SUSP
1.0000 mL | Freq: Once | INTRAMUSCULAR | Status: AC
  Administered 2016-07-09: 20 ug via INTRAMUSCULAR
  Filled 2016-07-09: qty 1

## 2016-07-09 MED ORDER — HAEMOPHILUS B POLYSAC CONJ VAC IM SOLR
0.5000 mL | Freq: Once | INTRAMUSCULAR | Status: AC
  Administered 2016-07-09: 0.5 mL via INTRAMUSCULAR
  Filled 2016-07-09: qty 0.5

## 2016-07-09 MED ORDER — TETANUS-DIPHTH-ACELL PERTUSSIS 5-2.5-18.5 LF-MCG/0.5 IM SUSP
0.5000 mL | Freq: Once | INTRAMUSCULAR | Status: AC
  Administered 2016-07-09: 0.5 mL via INTRAMUSCULAR
  Filled 2016-07-09: qty 0.5

## 2016-07-09 MED ORDER — PNEUMOCOCCAL 13-VAL CONJ VACC IM SUSP
0.5000 mL | INTRAMUSCULAR | Status: DC
  Filled 2016-07-09: qty 0.5

## 2016-07-09 MED ORDER — PNEUMOCOCCAL 13-VAL CONJ VACC IM SUSP
0.5000 mL | INTRAMUSCULAR | Status: AC
  Administered 2016-07-09: 0.5 mL via INTRAMUSCULAR
  Filled 2016-07-09: qty 0.5

## 2016-07-09 NOTE — Patient Instructions (Signed)
Patient was given written materials/handouts regarding all 5 vaccines she received today. Injection sites may be tender and sore. Also informed she may run a low grade fever. If she experiences worse symptoms she was instructed to notify MD.

## 2016-07-23 ENCOUNTER — Inpatient Hospital Stay: Payer: Managed Care, Other (non HMO)

## 2016-07-23 ENCOUNTER — Telehealth: Payer: Self-pay | Admitting: *Deleted

## 2016-07-23 ENCOUNTER — Other Ambulatory Visit: Payer: Self-pay | Admitting: Internal Medicine

## 2016-07-23 VITALS — BP 114/68 | HR 73 | Temp 94.5°F | Resp 16

## 2016-07-23 DIAGNOSIS — C9001 Multiple myeloma in remission: Secondary | ICD-10-CM

## 2016-07-23 DIAGNOSIS — Z23 Encounter for immunization: Secondary | ICD-10-CM | POA: Diagnosis not present

## 2016-07-23 LAB — BASIC METABOLIC PANEL
ANION GAP: 9 (ref 5–15)
BUN: 16 mg/dL (ref 6–20)
CALCIUM: 7.5 mg/dL — AB (ref 8.9–10.3)
CHLORIDE: 106 mmol/L (ref 101–111)
CO2: 25 mmol/L (ref 22–32)
Creatinine, Ser: 1.15 mg/dL — ABNORMAL HIGH (ref 0.44–1.00)
GFR calc Af Amer: 59 mL/min — ABNORMAL LOW (ref >60)
GFR calc non Af Amer: 51 mL/min — ABNORMAL LOW (ref >60)
Glucose, Bld: 134 mg/dL — ABNORMAL HIGH (ref 65–99)
POTASSIUM: 3.7 mmol/L (ref 3.5–5.1)
Sodium: 140 mmol/L (ref 135–145)

## 2016-07-23 LAB — MAGNESIUM: Magnesium: 0.7 mg/dL — CL (ref 1.7–2.4)

## 2016-07-23 MED ORDER — ZOLEDRONIC ACID 4 MG/100ML IV SOLN: 4.0000 mg | Freq: Once | INTRAVENOUS | Status: DC

## 2016-07-23 MED ORDER — SODIUM CHLORIDE 0.9 % IV SOLN
Freq: Once | INTRAVENOUS | Status: AC
  Administered 2016-07-23: 09:00:00 via INTRAVENOUS
  Filled 2016-07-23: qty 1000

## 2016-07-23 MED ORDER — MAGNESIUM SULFATE 4 GM/100ML IV SOLN
4.0000 g | Freq: Once | INTRAVENOUS | Status: AC
  Administered 2016-07-23: 4 g via INTRAVENOUS

## 2016-07-23 NOTE — Telephone Encounter (Signed)
CRITICAL LAB call by Johny Sax    - Magnesium  0.7

## 2016-07-24 ENCOUNTER — Ambulatory Visit
Admission: RE | Admit: 2016-07-24 | Discharge: 2016-07-24 | Disposition: A | Discharge status: Home / Self Care | Payer: Managed Care, Other (non HMO) | Source: Ambulatory Visit | Attending: Otolaryngology | Admitting: Otolaryngology

## 2016-07-24 DIAGNOSIS — H814 Vertigo of central origin: Secondary | ICD-10-CM

## 2016-07-24 DIAGNOSIS — H8149 Vertigo of central origin, unspecified ear: Secondary | ICD-10-CM | POA: Diagnosis present

## 2016-07-24 MED ORDER — GADOBENATE DIMEGLUMINE 529 MG/ML IV SOLN
12.0000 mL | Freq: Once | INTRAVENOUS | Status: AC | PRN
  Administered 2016-07-24: 12 mL via INTRAVENOUS

## 2016-08-27 ENCOUNTER — Inpatient Hospital Stay: Payer: 59

## 2016-08-28 ENCOUNTER — Inpatient Hospital Stay: Payer: 59

## 2016-08-28 ENCOUNTER — Inpatient Hospital Stay: Payer: 59 | Attending: Internal Medicine

## 2016-08-28 VITALS — BP 117/67 | HR 55 | Temp 96.0°F | Resp 16

## 2016-08-28 DIAGNOSIS — C9001 Multiple myeloma in remission: Secondary | ICD-10-CM

## 2016-08-28 LAB — BASIC METABOLIC PANEL
Anion gap: 10 (ref 5–15)
BUN: 16 mg/dL (ref 6–20)
CO2: 27 mmol/L (ref 22–32)
CREATININE: 1 mg/dL (ref 0.44–1.00)
Calcium: 9.2 mg/dL (ref 8.9–10.3)
Chloride: 98 mmol/L — ABNORMAL LOW (ref 101–111)
GFR calc Af Amer: >60 mL/min (ref >60)
GFR calc non Af Amer: >60 mL/min (ref >60)
Glucose, Bld: 119 mg/dL — ABNORMAL HIGH (ref 65–99)
POTASSIUM: 3.9 mmol/L (ref 3.5–5.1)
SODIUM: 135 mmol/L (ref 135–145)

## 2016-08-28 LAB — MAGNESIUM: MAGNESIUM: 1.2 mg/dL — AB (ref 1.7–2.4)

## 2016-08-28 MED ORDER — SODIUM CHLORIDE 0.9 % IV SOLN
Freq: Once | INTRAVENOUS | Status: AC
  Administered 2016-08-28: 11:00:00 via INTRAVENOUS
  Filled 2016-08-28: qty 1000

## 2016-08-28 MED ORDER — ZOLEDRONIC ACID 4 MG/100ML IV SOLN
4.0000 mg | Freq: Once | INTRAVENOUS | Status: AC
  Administered 2016-08-28: 4 mg via INTRAVENOUS
  Filled 2016-08-28: qty 100

## 2016-08-28 MED ORDER — MAGNESIUM SULFATE 4 GM/100ML IV SOLN
4.0000 g | Freq: Once | INTRAVENOUS | Status: AC
  Administered 2016-08-28: 4 g via INTRAVENOUS

## 2016-08-28 NOTE — Patient Instructions (Signed)
Magnesium Sulfate injection What is this medicine? MAGNESIUM SULFATE (mag NEE zee um SUL fate) is an electrolyte injection commonly used to treat low magnesium levels in your blood. It is also used to prevent or control seizures in women with preeclampsia or eclampsia. This medicine may be used for other purposes; ask your health care provider or pharmacist if you have questions. What should I tell my health care provider before I take this medicine? They need to know if you have any of these conditions: -heart disease -history of irregular heart beat -kidney disease -an unusual or allergic reaction to magnesium sulfate, medicines, foods, dyes, or preservatives -pregnant or trying to get pregnant -breast-feeding How should I use this medicine? This medicine is for infusion into a vein. It is given by a health care professional in a hospital or clinic setting. Talk to your pediatrician regarding the use of this medicine in children. While this drug may be prescribed for selected conditions, precautions do apply. Overdosage: If you think you have taken too much of this medicine contact a poison control center or emergency room at once. NOTE: This medicine is only for you. Do not share this medicine with others. What if I miss a dose? This does not apply. What may interact with this medicine? This medicine may interact with the following medications: -certain medicines for anxiety or sleep -certain medicines for seizures like phenobarbital -digoxin -medicines that relax muscles for surgery -narcotic medicines for pain This list may not describe all possible interactions. Give your health care provider a list of all the medicines, herbs, non-prescription drugs, or dietary supplements you use. Also tell them if you smoke, drink alcohol, or use illegal drugs. Some items may interact with your medicine. What should I watch for while using this medicine? Your condition will be monitored carefully  while you are receiving this medicine. You may need blood work done while you are receiving this medicine. What side effects may I notice from receiving this medicine? Side effects that you should report to your doctor or health care professional as soon as possible: -allergic reactions like skin rash, itching or hives, swelling of the face, lips, or tongue -facial flushing -muscle weakness -signs and symptoms of low blood pressure like dizziness; feeling faint or lightheaded, falls; unusually weak or tired -signs and symptoms of a dangerous change in heartbeat or heart rhythm like chest pain; dizziness; fast or irregular heartbeat; palpitations; breathing problems -sweating This list may not describe all possible side effects. Call your doctor for medical advice about side effects. You may report side effects to FDA at 1-800-FDA-1088. Where should I keep my medicine? This drug is given in a hospital or clinic and will not be stored at home. NOTE: This sheet is a summary. It may not cover all possible information. If you have questions about this medicine, talk to your doctor, pharmacist, or health care provider.  2018 Elsevier/Gold Standard (2016-01-03 12:31:42)  

## 2016-09-02 ENCOUNTER — Encounter: Payer: Self-pay | Admitting: Internal Medicine

## 2016-09-02 ENCOUNTER — Ambulatory Visit (INDEPENDENT_AMBULATORY_CARE_PROVIDER_SITE_OTHER): Payer: 59 | Admitting: Internal Medicine

## 2016-09-02 VITALS — BP 118/62 | HR 60 | Temp 98.7°F | Ht 63.0 in | Wt 132.4 lb

## 2016-09-02 DIAGNOSIS — D485 Neoplasm of uncertain behavior of skin: Secondary | ICD-10-CM

## 2016-09-02 DIAGNOSIS — F5101 Primary insomnia: Secondary | ICD-10-CM | POA: Diagnosis not present

## 2016-09-02 DIAGNOSIS — J0140 Acute pansinusitis, unspecified: Secondary | ICD-10-CM

## 2016-09-02 MED ORDER — AMOXICILLIN-POT CLAVULANATE 875-125 MG PO TABS: 1.0000 | ORAL_TABLET | Freq: Two times a day (BID) | ORAL | 0 refills | Status: DC

## 2016-09-02 MED ORDER — FLUCONAZOLE 100 MG PO TABS: 100.0000 mg | ORAL_TABLET | Freq: Every day | ORAL | 0 refills | Status: DC

## 2016-09-02 MED ORDER — ZALEPLON 10 MG PO CAPS: 10.0000 mg | ORAL_CAPSULE | Freq: Every evening | ORAL | 3 refills | Status: DC | PRN

## 2016-09-02 NOTE — Progress Notes (Addendum)
Date:  09/02/2016   Name:  Sarah [name]   DOB:  08/20/1955   MRN:  [id number]   Chief Complaint: Generalized Body Aches (No fever- Headache & nausea. Can't get over feeling. X 1 day for body aches. Been having sinus problems for over a month now.) and skin spot (Pt has spot on leg that is getting darker in color, and it becoming sore. ) Sinus Problem  This is a new problem. The current episode started 1 to 4 weeks ago. The problem has been gradually worsening since onset. There has been no fever. Associated symptoms include congestion, headaches, sinus pressure and a sore throat. Pertinent negatives include no chills, coughing, ear pain or shortness of breath. Past treatments include saline sprays and spray decongestants. The treatment provided no relief.  Insomnia  Primary symptoms: fragmented sleep.  The problem occurs nightly. The problem has been gradually worsening (no longer responding to trazodone) since onset.   Skin lesion - lesion on right lower leg - flesh colored for a long time but now showing some pigmentation and may be getting slightly larger and slightly tender.   Review of Systems  Constitutional: Positive for fatigue. Negative for chills and fever.  HENT: Positive for congestion, sinus pressure and sore throat. Negative for ear pain.   Respiratory: Negative for cough, shortness of breath and wheezing.   Cardiovascular: Negative for chest pain, palpitations and leg swelling.  Skin: Positive for color change.  Allergic/Immunologic: Positive for environmental allergies.  Neurological: Positive for headaches. Negative for dizziness and numbness.  Psychiatric/Behavioral: The patient has insomnia.     Patient Active Problem List   Diagnosis Date Noted  . Sinus congestion 05/30/2016  . Environmental and seasonal allergies 04/19/2016  . Insomnia 04/19/2016  . Bicuspid aortic valve 04/19/2016  . Asthma 04/19/2016  . Aortic regurgitation 04/19/2016  . History of CHF  (congestive heart failure) 04/19/2016  . DM (diabetes mellitus), type 2 with renal complications ([place]) 41/63/[id number]  . Anemia 04/12/2016  . GERD (gastroesophageal reflux disease) 04/12/2016  . Depression with anxiety 04/12/2016  . Irritable bowel syndrome (IBS) 04/12/2016  . Hepatic steatosis 04/12/2016  . Multiple myeloma in remission ([place]) 04/02/2016  . Abnormal stress test 02/14/2016  . 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[place]) 07/06/2015  . Disequilibrium 01/14/2013    Prior to Admission medications   Medication Sig Start Date End Date Taking? Authorizing Provider  acyclovir (ZOVIRAX) 200 MG capsule Take 2 capsules (400 mg total) by mouth 3 (three) times daily. 05/30/16  Yes Glean Hess, MD  allopurinol (ZYLOPRIM) 100 MG tablet Take 1 tablet (100 mg total) by mouth daily. 05/30/16  Yes Glean Hess, MD  Biotin 1 MG CAPS Take by mouth.   Yes Historical Provider, MD  bisoprolol (ZEBETA) 10 MG tablet Take 1 tablet (10 mg total) by mouth daily. 05/30/16  Yes Glean Hess, MD  Blood Glucose Monitoring Suppl (FIFTY50 GLUCOSE METER 2.0) w/Device KIT  09/04/15  Yes Historical Provider, MD  Blood Glucose Monitoring Suppl (ONE TOUCH ULTRA 2) w/Device KIT  09/04/15  Yes Historical Provider, MD  Calcium Carbonate-Vit D-Min (CALCIUM 600+D PLUS MINERALS) 600-400 MG-UNIT TABS Take by mouth.   Yes Historical Provider, MD  cholestyramine (QUESTRAN) 4 g packet Take 4 g once daily. 06/18/16  Yes Cammie Sickle, MD  diphenoxylate-atropine (LOMOTIL) 2.5-0.025 MG tablet Take 2 tablets by mouth daily as needed for diarrhea or loose stools.  04/02/16  Yes Historical Provider, MD  DULoxetine (CYMBALTA) 60  MG capsule TAKE 1 CAPSULE(60 MG) BY MOUTH TWICE DAILY 06/17/16  Yes Glean Hess, MD  fentaNYL (DURAGESIC - DOSED MCG/HR) 25 MCG/HR patch Place 25 mcg onto the skin every 3 (three) days. 08/12/16  Yes Historical Provider, MD  fexofenadine (ALLEGRA) 180 MG tablet Take 1 tablet (180 mg  total) by mouth daily. 05/30/16  Yes Glean Hess, MD  fluticasone (FLONASE) 50 MCG/ACT nasal spray Place 2 sprays into both nostrils daily. 05/30/16  Yes Glean Hess, MD  furosemide (LASIX) 20 MG tablet Take 0.5 tablets (10 mg total) by mouth daily as needed. 05/30/16  Yes Glean Hess, MD  glucose blood (ONE TOUCH ULTRA TEST) test strip  09/04/15  Yes Historical Provider, MD  hydrOXYzine (ATARAX/VISTARIL) 10 MG tablet Take 1 tablet (10 mg total) by mouth 3 (three) times daily as needed. 05/30/16  Yes Glean Hess, MD  montelukast (SINGULAIR) 10 MG tablet Take 1 tablet (10 mg total) by mouth at bedtime. 05/30/16  Yes Glean Hess, MD  Multiple Vitamins-Minerals (HAIR/SKIN/NAILS/BIOTIN PO) Take 1 capsule by mouth 3 (three) times daily.   Yes Historical Provider, MD  omeprazole (PRILOSEC) 40 MG capsule Take 1 capsule (40 mg total) by mouth daily. Patient taking differently: Take 40 mg by mouth 2 (two) times daily.  05/30/16  Yes Glean Hess, MD  [facility] LANCETS 11X [place]  09/04/15  Yes Historical Provider, MD  promethazine (PHENERGAN) 25 MG tablet Take 25 mg by mouth as needed.  02/09/16  Yes Historical Provider, MD  REVLIMID 5 MG capsule Take 1 capsule (5 mg total) by mouth daily. X 21 days.  1 week off 06/25/16  Yes Sindy Gu[place], MD  tiZANidine (ZANAFLEX) 4 MG tablet Take 1 tablet (4 mg total) by mouth every 8 (eight) hours as needed. Muscle relaxer for back pain 05/30/16  Yes Glean Hess, MD  traZODone (DESYREL) 50 MG tablet Take 2-3 tablets (100-150 mg total) by mouth at bedtime as needed for sleep. 05/30/16  Yes Glean Hess, MD  Calcium Carbonate-Vitamin D (CALTRATE 600+D) 600-400 MG-UNIT tablet Take 1 tablet by mouth daily.    Historical Provider, MD  HYDROcodone-ibuprofen (VICOPROFEN) 7.5-200 MG tablet Take 1 tablet by mouth every 8 (eight) hours as needed. Pain Patient not taking: Reported on 09/02/2016 04/02/16   Cammie Sickle, MD  Zoledronic Acid  (ZOMETA IV) Inject into the vein every 30 (thirty) days.    Historical Provider, MD    Allergies  Allergen Reactions  . Oxycodone-Acetaminophen   . Benadryl [Diphenhydramine] Palpitations  . Morphine Itching and Rash  . Morphine And Related Itching and Rash  . Ondansetron Diarrhea    CAUSES PATIENT DIARRHEA CAUSES PATIENT DIARRHEA  . Tylenol [Acetaminophen] Itching and Rash  . Zofran [Ondansetron Hcl] Diarrhea    Past Surgical History:  Procedure Laterality Date  . 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 CARDIAC ELECTROPHYSIOLOGY MAPPING AND ABLATION    . CARPAL TUNNEL RELEASE Bilateral   . CHOLECYSTECTOMY  2008  . FOOT SURGERY Bilateral   . INCONTINENCE SURGERY  2009  . PARTIAL HYSTERECTOMY  03/1996   fibroids  . TONSILLECTOMY  2007    Social History  Substance Use Topics  . Smoking status: Former Smoker    Packs/day: 1.00    Years: 20.00    Types: Cigarettes    Quit date: 07/02/1991  . Smokeless tobacco: Never Used  . Alcohol use 1.2 oz/week    1 Glasses of wine, 1  Cans of beer per week     Comment: occassional     Medication list has been reviewed and updated.   Physical Exam  Constitutional: She is oriented to person, place, and time. She appears well-developed and well-nourished.  HENT:  Right Ear: External ear and ear canal normal. Tympanic membrane is not erythematous and not retracted.  Left Ear: External ear and ear canal normal. Tympanic membrane is not erythematous and not retracted.  Nose: Right sinus exhibits maxillary sinus tenderness and frontal sinus tenderness. Left sinus exhibits maxillary sinus tenderness and frontal sinus tenderness.  Mouth/Throat: Uvula is midline and mucous membranes are normal. No oral lesions. Posterior oropharyngeal erythema present. No oropharyngeal exudate.  Cardiovascular: Normal rate, regular rhythm and normal heart sounds.   Pulmonary/Chest: Breath sounds normal. She has no wheezes. She has no rales.  Lymphadenopathy:      She has no cervical adenopathy.  Neurological: She is alert and oriented to person, place, and time.  Skin:  .3 cm raised, irregularly pigmented firm nodule right lower leg    BP 118/62   Pulse 60   Temp 98.7 F (37.1 C)   Ht 5' 3"  (1.6 m)   Wt 132 lb 6.4 oz (60.1 kg)   SpO2 100%   BMI 23.45 kg/m   Assessment and Plan: 1. Acute non-recurrent pansinusitis Continue flonase and allergy medication  2. Primary insomnia Wean off trazodone Trial of [place]  3. Neoplasm of uncertain behavior of skin - Ambulatory referral to Dermatology   Meds ordered this encounter  Medications  . amoxicillin-clavulanate (AUGMENTIN) 875-125 MG tablet    Sig: Take 1 tablet by mouth 2 (two) times daily.    Dispense:  20 tablet    Refill:  0  . fluconazole (DIFLUCAN) 100 MG tablet    Sig: Take 1 tablet (100 mg total) by mouth daily.    Dispense:  3 tablet    Refill:  0  . zaleplon (SONATA) 10 MG capsule    Sig: Take 1 capsule (10 mg total) by mouth at bedtime as needed for sleep.    Dispense:  30 capsule    Refill:  [place], MD [facility]  09/02/2016

## 2016-09-02 NOTE — Addendum Note (Signed)
Addended by: Glean Hess on: 09/02/2016 04:00 PM   Modules accepted: Level of Service

## 2016-09-05 ENCOUNTER — Telehealth: Payer: Self-pay | Admitting: *Deleted

## 2016-09-05 NOTE — Telephone Encounter (Signed)
Patient states that she had her federal disability fax a records request to our office.  She asked if I received these records request. I explained that these records request would not have been sent to the nursing team, but would have been sent to medical records.  I have asked that patient have her federal disability refax the request. I did leave a vm [phone] for [facility] medical records to see if this request was rcvd in that dept.

## 2016-09-05 NOTE — Telephone Encounter (Signed)
-----   Message from Secundino Ginger sent at 09/05/2016  2:06 PM EST ----- Regarding: disability This pt called and wanted to know if you have filled out her disability that was faxed to [place]. I will call her when you let me know.

## 2016-09-07 DIAGNOSIS — Z79891 Long term (current) use of opiate analgesic: Secondary | ICD-10-CM | POA: Insufficient documentation

## 2016-09-10 ENCOUNTER — Inpatient Hospital Stay: Payer: Managed Care, Other (non HMO) | Attending: Internal Medicine

## 2016-09-10 VITALS — BP 119/62 | HR 50 | Temp 95.4°F | Resp 16

## 2016-09-10 DIAGNOSIS — G629 Polyneuropathy, unspecified: Secondary | ICD-10-CM | POA: Insufficient documentation

## 2016-09-10 DIAGNOSIS — F329 Major depressive disorder, single episode, unspecified: Secondary | ICD-10-CM | POA: Insufficient documentation

## 2016-09-10 DIAGNOSIS — K529 Noninfective gastroenteritis and colitis, unspecified: Secondary | ICD-10-CM | POA: Insufficient documentation

## 2016-09-10 DIAGNOSIS — I129 Hypertensive chronic kidney disease with stage 1 through stage 4 chronic kidney disease, or unspecified chronic kidney disease: Secondary | ICD-10-CM | POA: Insufficient documentation

## 2016-09-10 DIAGNOSIS — K219 Gastro-esophageal reflux disease without esophagitis: Secondary | ICD-10-CM | POA: Insufficient documentation

## 2016-09-10 DIAGNOSIS — E119 Type 2 diabetes mellitus without complications: Secondary | ICD-10-CM | POA: Insufficient documentation

## 2016-09-10 DIAGNOSIS — M109 Gout, unspecified: Secondary | ICD-10-CM | POA: Insufficient documentation

## 2016-09-10 DIAGNOSIS — R197 Diarrhea, unspecified: Secondary | ICD-10-CM | POA: Diagnosis not present

## 2016-09-10 DIAGNOSIS — Z23 Encounter for immunization: Secondary | ICD-10-CM | POA: Diagnosis not present

## 2016-09-10 DIAGNOSIS — R634 Abnormal weight loss: Secondary | ICD-10-CM | POA: Diagnosis not present

## 2016-09-10 DIAGNOSIS — N281 Cyst of kidney, acquired: Secondary | ICD-10-CM | POA: Diagnosis not present

## 2016-09-10 DIAGNOSIS — R5383 Other fatigue: Secondary | ICD-10-CM | POA: Diagnosis not present

## 2016-09-10 DIAGNOSIS — M545 Low back pain: Secondary | ICD-10-CM | POA: Diagnosis not present

## 2016-09-10 DIAGNOSIS — Z803 Family history of malignant neoplasm of breast: Secondary | ICD-10-CM | POA: Insufficient documentation

## 2016-09-10 DIAGNOSIS — Z801 Family history of malignant neoplasm of trachea, bronchus and lung: Secondary | ICD-10-CM | POA: Insufficient documentation

## 2016-09-10 DIAGNOSIS — C9001 Multiple myeloma in remission: Secondary | ICD-10-CM | POA: Diagnosis present

## 2016-09-10 DIAGNOSIS — N183 Chronic kidney disease, stage 3 (moderate): Secondary | ICD-10-CM | POA: Diagnosis not present

## 2016-09-10 DIAGNOSIS — F419 Anxiety disorder, unspecified: Secondary | ICD-10-CM | POA: Diagnosis not present

## 2016-09-10 DIAGNOSIS — Z9481 Bone marrow transplant status: Secondary | ICD-10-CM | POA: Insufficient documentation

## 2016-09-10 DIAGNOSIS — Z87891 Personal history of nicotine dependence: Secondary | ICD-10-CM | POA: Insufficient documentation

## 2016-09-10 DIAGNOSIS — R011 Cardiac murmur, unspecified: Secondary | ICD-10-CM | POA: Insufficient documentation

## 2016-09-10 DIAGNOSIS — G8929 Other chronic pain: Secondary | ICD-10-CM | POA: Insufficient documentation

## 2016-09-10 DIAGNOSIS — Z800 Family history of malignant neoplasm of digestive organs: Secondary | ICD-10-CM | POA: Insufficient documentation

## 2016-09-10 DIAGNOSIS — I509 Heart failure, unspecified: Secondary | ICD-10-CM | POA: Diagnosis not present

## 2016-09-10 DIAGNOSIS — Z79899 Other long term (current) drug therapy: Secondary | ICD-10-CM | POA: Insufficient documentation

## 2016-09-10 DIAGNOSIS — D696 Thrombocytopenia, unspecified: Secondary | ICD-10-CM | POA: Diagnosis not present

## 2016-09-10 DIAGNOSIS — Q231 Congenital insufficiency of aortic valve: Secondary | ICD-10-CM | POA: Insufficient documentation

## 2016-09-10 DIAGNOSIS — Z9484 Stem cells transplant status: Secondary | ICD-10-CM

## 2016-09-10 MED ORDER — HEPATITIS B VAC RECOMBINANT 20 MCG/ML IJ SUSP
1.0000 mL | Freq: Once | INTRAMUSCULAR | Status: AC
  Administered 2016-09-10: 20 ug via INTRAMUSCULAR
  Filled 2016-09-10: qty 1

## 2016-09-10 MED ORDER — TETANUS-DIPHTH-ACELL PERTUSSIS 5-2.5-18.5 LF-MCG/0.5 IM SUSP
0.5000 mL | Freq: Once | INTRAMUSCULAR | Status: AC
  Administered 2016-09-10: 0.5 mL via INTRAMUSCULAR
  Filled 2016-09-10: qty 0.5

## 2016-09-10 MED ORDER — POLIOVIRUS VACCINE INACTIVATED IJ INJ
0.5000 mL | INJECTION | Freq: Once | INTRAMUSCULAR | Status: AC
  Administered 2016-09-10: 0.5 mL via SUBCUTANEOUS
  Filled 2016-09-10: qty 5

## 2016-09-10 MED ORDER — HAEMOPHILUS B POLYSAC CONJ VAC IM SOLR
0.5000 mL | Freq: Once | INTRAMUSCULAR | Status: AC
  Administered 2016-09-10: 0.5 mL via INTRAMUSCULAR
  Filled 2016-09-10: qty 0.5

## 2016-09-10 NOTE — Progress Notes (Signed)
Physician made aware that patient is on day 4/5 of antibiotic for sinus infection.  Patient afebrile today.  Physician said that patient could have injections as ordered.

## 2016-09-10 NOTE — Patient Instructions (Signed)
Tdap Vaccine (Tetanus, Diphtheria and Pertussis): What You Need to Know 1. Why get vaccinated? Tetanus, diphtheria and pertussis are very serious diseases. Tdap vaccine can protect us from these diseases. And, Tdap vaccine given to pregnant women can protect newborn babies against pertussis. TETANUS (Lockjaw) is rare in the [place] today. It causes painful muscle tightening and stiffness, usually all over the body.  It can lead to tightening of muscles in the head and neck so you can't open your mouth, swallow, or sometimes even breathe. Tetanus kills about 1 out of 10 people who are infected even after receiving the best medical care. DIPHTHERIA is also rare in the [place] today. It can cause a thick coating to form in the back of the throat.  It can lead to breathing problems, heart failure, paralysis, and death. PERTUSSIS (Whooping Cough) causes severe coughing spells, which can cause difficulty breathing, vomiting and disturbed sleep.  It can also lead to weight loss, incontinence, and rib fractures. Up to 2 in 100 adolescents and 5 in 100 adults with pertussis are hospitalized or have complications, which could include pneumonia or death. These diseases are caused by bacteria. Diphtheria and pertussis are spread from person to person through secretions from coughing or sneezing. Tetanus enters the body through cuts, scratches, or wounds. Before vaccines, as many as 200,000 cases of diphtheria, 200,000 cases of pertussis, and hundreds of cases of tetanus, were reported in the [place] each year. Since vaccination began, reports of cases for tetanus and diphtheria have dropped by about 99% and for pertussis by about 80%. 2. Tdap vaccine Tdap vaccine can protect adolescents and adults from tetanus, diphtheria, and pertussis. One dose of Tdap is routinely given at age 11 or 12. People who did not get Tdap at that age should get it as soon as possible. Tdap is especially important  for healthcare professionals and anyone having close contact with a baby younger than 12 months. Pregnant women should get a dose of Tdap during every pregnancy, to protect the newborn from pertussis. Infants are most at risk for severe, life-threatening complications from pertussis. Another vaccine, called Td, protects against tetanus and diphtheria, but not pertussis. A Td booster should be given every 10 years. Tdap may be given as one of these boosters if you have never gotten Tdap before. Tdap may also be given after a severe cut or burn to prevent tetanus infection. Your doctor or the person giving you the vaccine can give you more information. Tdap may safely be given at the same time as other vaccines. 3. Some people should not get this vaccine  A person who has ever had a life-threatening allergic reaction after a previous dose of any diphtheria, tetanus or pertussis containing vaccine, OR has a severe allergy to any part of this vaccine, should not get Tdap vaccine. Tell the person giving the vaccine about any severe allergies.  Anyone who had coma or long repeated seizures within 7 days after a childhood dose of DTP or DTaP, or a previous dose of Tdap, should not get Tdap, unless a cause other than the vaccine was found. They can still get Td.  Talk to your doctor if you:  have seizures or another nervous system problem,  had severe pain or swelling after any vaccine containing diphtheria, tetanus or pertussis,  ever had a condition called Guillain-Barr Syndrome (GBS),  aren't feeling well on the day the shot is scheduled. 4. Risks With any medicine, including vaccines, there is   a chance of side effects. These are usually mild and go away on their own. Serious reactions are also possible but are rare. Most people who get Tdap vaccine do not have any problems with it. Mild problems following Tdap:  (Did not interfere with activities)  Pain where the shot was given (about 3 in 4  adolescents or 2 in 3 adults)  Redness or swelling where the shot was given (about 1 person in 5)  Mild fever of at least 100.24F (up to about 1 in 25 adolescents or 1 in 100 adults)  Headache (about 3 or 4 people in 10)  Tiredness (about 1 person in 3 or 4)  Nausea, vomiting, diarrhea, stomach ache (up to 1 in 4 adolescents or 1 in 10 adults)  Chills, sore joints (about 1 person in 10)  Body aches (about 1 person in 3 or 4)  Rash, swollen glands (uncommon) Moderate problems following Tdap:  (Interfered with activities, but did not require medical attention)  Pain where the shot was given (up to 1 in 5 or 6)  Redness or swelling where the shot was given (up to about 1 in 16 adolescents or 1 in 12 adults)  Fever over 102F (about 1 in 100 adolescents or 1 in 250 adults)  Headache (about 1 in 7 adolescents or 1 in 10 adults)  Nausea, vomiting, diarrhea, stomach ache (up to 1 or 3 people in 100)  Swelling of the entire arm where the shot was given (up to about 1 in 500). Severe problems following Tdap:  (Unable to perform usual activities; required medical attention)  Swelling, severe pain, bleeding and redness in the arm where the shot was given (rare). Problems that could happen after any vaccine:   People sometimes faint after a medical procedure, including vaccination. Sitting or lying down for about 15 minutes can help prevent fainting, and injuries caused by a fall. Tell your doctor if you feel dizzy, or have vision changes or ringing in the ears.  Some people get severe pain in the shoulder and have difficulty moving the arm where a shot was given. This happens very rarely.  Any medication can cause a severe allergic reaction. Such reactions from a vaccine are very rare, estimated at fewer than 1 in a million doses, and would happen within a few minutes to a few hours after the vaccination. As with any medicine, there is a very remote chance of a vaccine causing a  serious injury or death. The safety of vaccines is always being monitored. For more information, visit: http://www.aguilar.org/ 5. What if there is a serious problem? What should I look for?  Look for anything that concerns you, such as signs of a severe allergic reaction, very high fever, or unusual behavior. Signs of a severe allergic reaction can include hives, swelling of the face and throat, difficulty breathing, a fast heartbeat, dizziness, and weakness. These would usually start a few minutes to a few hours after the vaccination. What should I do?   If you think it is a severe allergic reaction or other emergency that can't wait, call 9-1-1 or get the person to the nearest hospital. Otherwise, call your doctor.  Afterward, the reaction should be reported to the Vaccine Adverse Event Reporting System (VAERS). Your doctor might file this report, or you can do it yourself through the VAERS web site at www.vaers.SamedayNews.es, or by calling 2392699893.  VAERS does not give medical advice. 6. The National Vaccine Injury [employer]  Vaccine Injury Compensation Program (VICP) is a federal program that was created to compensate people who may have been injured by certain vaccines. Persons who believe they may have been injured by a vaccine can learn about the program and about filing a claim by calling 346-349-6427 or visiting the [employer] website at GoldCloset.com.ee. There is a time limit to file a claim for compensation. 7. How can I learn more?  Ask your doctor. He or she can give you the vaccine package insert or suggest other sources of information.  Call your local or state health department.  Contact the Centers for Disease Control and Prevention (CDC):  Call (313)723-1097 (1-800-CDC-INFO) or  Visit CDC's website at http://hunter.com/ CDC Tdap Vaccine VIS (08/24/13) This information is not intended to replace advice given to you by your health  care provider. Make sure you discuss any questions you have with your health care provider. Document Released: 12/17/2011 Document Revised: 03/07/2016 Document Reviewed: 03/07/2016 Elsevier Interactive Patient Education  2017 [place]. Haemophilus influenzae type B Conjugate; Hepatitis B Vaccine injection What is this medicine? HAEMOPHILUS INFLUENZAE TYPE B CONJUGATE VACCINE; HEPATITIS B VACCINE, RECOMBINANT INJECTION (hem OFF fil [place] in floo En zuh vak SEEN; hep uh TAHY tis B vak SEEN) is used to prevent infections of the Haemophilus bacteria and the hepatitis B virus. This medicine may be used for other purposes; ask your health care provider or pharmacist if you have questions. COMMON BRAND NAME(S): Comvax What should I tell my health care provider before I take this medicine? They need to know if you have any of these conditions: -bleeding disorder -hepatitis B infection -immune system problems -infection with fever -low blood counts, like low white cell, platelet, or red cell counts -take medicines that treat or prevent blood clots -an unusual or allergic reaction to vaccines, yeast, other medicines, foods, dyes, or preservatives -pregnant or trying to get pregnant -breast-feeding How should I use this medicine? This vaccine is for injection into a muscle. It is given by a health care professional. A copy of Vaccine Information Statements will be given before each vaccination. Read this sheet carefully each time. The sheet may change frequently. Talk to your pediatrician regarding the use of this medicine in children. While this drug may be prescribed for children as young as 62 weeks old for selected conditions, precautions do apply. Overdosage: If you think you have taken too much of this medicine contact a poison control center or emergency room at once. NOTE: This medicine is only for you. Do not share this medicine with others. What if I miss a dose? Keep appointments for  follow-up (booster) doses as directed. It is important not to miss your dose. Call your doctor or health care professional if you are unable to keep an appointment. What may interact with this medicine? -medicines that suppress your immune function like adalimumab, anakinra, infliximab -medicines to treat cancer -medicines that treat or prevent blood clots like warfarin, enoxaparin, and dalteparin -steroid medicines like prednisone or cortisone This list may not describe all possible interactions. Give your health care provider a list of all the medicines, herbs, non-prescription drugs, or dietary supplements you use. Also tell them if you smoke, drink alcohol, or use illegal drugs. Some items may interact with your medicine. What should I watch for while using this medicine? Visit your doctor for regular check-ups as directed. This vaccine, like all vaccines, may not fully protect everyone. What side effects may I notice from receiving this medicine? Side effects  that you should report to your doctor or health care professional as soon as possible: -allergic reactions like skin rash, itching or hives, swelling of the face, lips, or tongue -breathing problems -extreme changes in behavior -fever over 101 degrees F -pain, tingling, numbness in the hands or feet -seizures -unusually weak or tired Side effects that usually do not require medical attention (report to your doctor or health care professional if they continue or are bothersome): -diarrhea -loss of appetite -low-grade fever of 100 degrees F or less -nausea, vomiting -pain, redness, swelling, or irritation at site where injected -tiredness This list may not describe all possible side effects. Call your doctor for medical advice about side effects. You may report side effects to FDA at 1-800-FDA-1088. Where should I keep my medicine? This drug is given in a hospital or clinic and will not be stored at home. NOTE: This sheet is a  summary. It may not cover all possible information. If you have questions about this medicine, talk to your doctor, pharmacist, or health care provider.  2018 Elsevier/Gold Standard (2015-07-20 11:30:38) Diphtheria, Tetanus, Acellular Pertussis, Hepatitis B, Poliovirus Vaccine What is this medicine? DIPHTHERIA TOXOID, TETANUS TOXOID, ACELLULAR PERTUSSIS VACCINE, DTaP; HEPATITIS B VACCINE, RECOMBINANT; INACTIVATED POLIOVIRUS VACCINE, IPV (dif THEER ee uh TOK soid, TET n [place] TOK soid, ey SEL yuh ler per TUS iss VAK seen, DTaP; hep uh TAHY tis B VAK seen; in ak tuh vey ted poh lee oh vahy ruhs VAK seen, IPV ) is used to prevent infections of diphtheria, tetanus (lockjaw), pertussis (whooping cough), hepatitis B, and poliovirus. This medicine may be used for other purposes; ask your health care provider or pharmacist if you have questions. COMMON BRAND NAME(S): Pediarix What should I tell my health care provider before I take this medicine? They need to know if you have any of these conditions: -infection with fever -neurological disease -seizure disorder -an unusual or allergic reaction to vaccines, yeast, neomycin, polymyxin B, latex, other medicines, foods, dyes, or preservatives -pregnant or trying to get pregnant -breast-feeding How should I use this medicine? This vaccine is for injection into a muscle. It is given by a health care professional. A copy of Vaccine Information Statements will be given before each vaccination. Read this sheet carefully each time. The sheet may change frequently. Talk to your pediatrician regarding the use of this medicine in children. While this drug may be prescribed for children as young as 51 weeks old for selected conditions, precautions do apply. Overdosage: If you think you have taken too much of this medicine contact a poison control center or emergency room at once. NOTE: This medicine is only for you. Do not share this medicine with others. What if I miss  a dose? Keep appointments for follow-up (booster) doses as directed. It is important not to miss your dose. Call your doctor or health care professional if you are unable to keep an appointment. What may interact with this medicine? -adalimumab -anakinra -infliximab -medicines that suppress your immune system -medicines to treat cancer -steroid medicines like prednisone or cortisone This list may not describe all possible interactions. Give your health care provider a list of all the medicines, herbs, non-prescription drugs, or dietary supplements you use. Also tell them if you smoke, drink alcohol, or use illegal drugs. Some items may interact with your medicine. What should I watch for while using this medicine? Visit your doctor for regular check-ups as directed. This vaccine, like all vaccines, may not fully protect everyone.  What side effects may I notice from receiving this medicine? Side effects that you should report to your doctor or health care professional as soon as possible: -allergic reactions like skin rash, itching or hives, swelling of the face, lips, or tongue -blueish color to lips or nail beds -breathing problems -extreme changes in behavior -fever over 101 degrees F -inconsolable crying for 3 hours or more -seizures -unusual bruising or bleeding -unusually weak or tired Side effects that usually do not require medical attention (report to your doctor or health care professional if they continue or are bothersome): -aches or pains -bruising, pain, swelling at site where injected -diarrhea -fussy -low-grade fever -loss of appetite -sleepy -vomiting This list may not describe all possible side effects. Call your doctor for medical advice about side effects. You may report side effects to FDA at 1-800-FDA-1088. Where should I keep my medicine? This drug is given in a hospital or clinic and will not be stored at home. NOTE: This sheet is a summary. It may not cover  all possible information. If you have questions about this medicine, talk to your doctor, pharmacist, or health care provider.  2018 Elsevier/Gold Standard (2007-11-16 20:51:02)

## 2016-09-24 ENCOUNTER — Telehealth: Payer: Self-pay | Admitting: Internal Medicine

## 2016-09-24 ENCOUNTER — Inpatient Hospital Stay (HOSPITAL_BASED_OUTPATIENT_CLINIC_OR_DEPARTMENT_OTHER): Payer: Managed Care, Other (non HMO) | Admitting: Internal Medicine

## 2016-09-24 ENCOUNTER — Other Ambulatory Visit: Payer: Self-pay | Admitting: *Deleted

## 2016-09-24 ENCOUNTER — Inpatient Hospital Stay: Payer: Managed Care, Other (non HMO)

## 2016-09-24 VITALS — BP 147/70 | HR 63 | Temp 97.6°F | Resp 18 | Ht 63.0 in | Wt 128.3 lb

## 2016-09-24 DIAGNOSIS — G8929 Other chronic pain: Secondary | ICD-10-CM

## 2016-09-24 DIAGNOSIS — M109 Gout, unspecified: Secondary | ICD-10-CM

## 2016-09-24 DIAGNOSIS — R634 Abnormal weight loss: Secondary | ICD-10-CM

## 2016-09-24 DIAGNOSIS — Z800 Family history of malignant neoplasm of digestive organs: Secondary | ICD-10-CM

## 2016-09-24 DIAGNOSIS — Z79899 Other long term (current) drug therapy: Secondary | ICD-10-CM

## 2016-09-24 DIAGNOSIS — C9001 Multiple myeloma in remission: Secondary | ICD-10-CM | POA: Diagnosis not present

## 2016-09-24 DIAGNOSIS — I509 Heart failure, unspecified: Secondary | ICD-10-CM

## 2016-09-24 DIAGNOSIS — N281 Cyst of kidney, acquired: Secondary | ICD-10-CM

## 2016-09-24 DIAGNOSIS — E119 Type 2 diabetes mellitus without complications: Secondary | ICD-10-CM

## 2016-09-24 DIAGNOSIS — N183 Chronic kidney disease, stage 3 (moderate): Secondary | ICD-10-CM | POA: Diagnosis not present

## 2016-09-24 DIAGNOSIS — R197 Diarrhea, unspecified: Secondary | ICD-10-CM | POA: Diagnosis not present

## 2016-09-24 DIAGNOSIS — Q231 Congenital insufficiency of aortic valve: Secondary | ICD-10-CM

## 2016-09-24 DIAGNOSIS — F419 Anxiety disorder, unspecified: Secondary | ICD-10-CM | POA: Diagnosis not present

## 2016-09-24 DIAGNOSIS — G629 Polyneuropathy, unspecified: Secondary | ICD-10-CM

## 2016-09-24 DIAGNOSIS — Z9481 Bone marrow transplant status: Secondary | ICD-10-CM

## 2016-09-24 DIAGNOSIS — Z23 Encounter for immunization: Secondary | ICD-10-CM

## 2016-09-24 DIAGNOSIS — T451X5A Adverse effect of antineoplastic and immunosuppressive drugs, initial encounter: Secondary | ICD-10-CM

## 2016-09-24 DIAGNOSIS — K219 Gastro-esophageal reflux disease without esophagitis: Secondary | ICD-10-CM

## 2016-09-24 DIAGNOSIS — M545 Low back pain: Secondary | ICD-10-CM | POA: Diagnosis not present

## 2016-09-24 DIAGNOSIS — D696 Thrombocytopenia, unspecified: Secondary | ICD-10-CM

## 2016-09-24 DIAGNOSIS — Z803 Family history of malignant neoplasm of breast: Secondary | ICD-10-CM

## 2016-09-24 DIAGNOSIS — K529 Noninfective gastroenteritis and colitis, unspecified: Secondary | ICD-10-CM

## 2016-09-24 DIAGNOSIS — R5383 Other fatigue: Secondary | ICD-10-CM

## 2016-09-24 DIAGNOSIS — I129 Hypertensive chronic kidney disease with stage 1 through stage 4 chronic kidney disease, or unspecified chronic kidney disease: Secondary | ICD-10-CM

## 2016-09-24 DIAGNOSIS — K521 Toxic gastroenteritis and colitis: Secondary | ICD-10-CM

## 2016-09-24 DIAGNOSIS — Z801 Family history of malignant neoplasm of trachea, bronchus and lung: Secondary | ICD-10-CM

## 2016-09-24 DIAGNOSIS — Z87891 Personal history of nicotine dependence: Secondary | ICD-10-CM

## 2016-09-24 DIAGNOSIS — R011 Cardiac murmur, unspecified: Secondary | ICD-10-CM

## 2016-09-24 DIAGNOSIS — F329 Major depressive disorder, single episode, unspecified: Secondary | ICD-10-CM

## 2016-09-24 LAB — CBC WITH DIFFERENTIAL/PLATELET
BASOS ABS: 0 10*3/uL (ref 0–0.1)
BASOS PCT: 1 %
EOS PCT: 1 %
Eosinophils Absolute: 0 10*3/uL (ref 0–0.7)
HEMATOCRIT: 32.8 % — AB (ref 35.0–47.0)
Hemoglobin: 11.3 g/dL — ABNORMAL LOW (ref 12.0–16.0)
LYMPHS ABS: 1 10*3/uL (ref 1.0–3.6)
Lymphocytes Relative: 27 %
MCH: 31.4 pg (ref 26.0–34.0)
MCHC: 34.6 g/dL (ref 32.0–36.0)
MCV: 90.6 fL (ref 80.0–100.0)
MONOS PCT: 9 %
Monocytes Absolute: 0.3 10*3/uL (ref 0.2–0.9)
NEUTROS PCT: 62 %
Neutro Abs: 2.2 10*3/uL (ref 1.4–6.5)
PLATELETS: 120 10*3/uL — AB (ref 150–440)
RBC: 3.62 MIL/uL — ABNORMAL LOW (ref 3.80–5.20)
RDW: 13.7 % (ref 11.5–14.5)
WBC: 3.6 10*3/uL (ref 3.6–11.0)

## 2016-09-24 LAB — MAGNESIUM: Magnesium: 1.1 mg/dL — ABNORMAL LOW (ref 1.7–2.4)

## 2016-09-24 LAB — COMPREHENSIVE METABOLIC PANEL
ALBUMIN: 4.4 g/dL (ref 3.5–5.0)
ALT: 24 U/L (ref 14–54)
ANION GAP: 7 (ref 5–15)
AST: 29 U/L (ref 15–41)
Alkaline Phosphatase: 85 U/L (ref 38–126)
BILIRUBIN TOTAL: 0.7 mg/dL (ref 0.3–1.2)
BUN: 22 mg/dL — AB (ref 6–20)
CHLORIDE: 106 mmol/L (ref 101–111)
CO2: 24 mmol/L (ref 22–32)
Calcium: 9.1 mg/dL (ref 8.9–10.3)
Creatinine, Ser: 1.01 mg/dL — ABNORMAL HIGH (ref 0.44–1.00)
GFR calc Af Amer: >60 mL/min (ref >60)
GFR, EST NON AFRICAN AMERICAN: 60 mL/min — AB (ref >60)
GLUCOSE: 118 mg/dL — AB (ref 65–99)
Potassium: 3.9 mmol/L (ref 3.5–5.1)
Sodium: 137 mmol/L (ref 135–145)
TOTAL PROTEIN: 7.1 g/dL (ref 6.5–8.1)

## 2016-09-24 LAB — TSH: TSH: 1.655 u[IU]/mL (ref 0.350–4.500)

## 2016-09-24 MED ORDER — DIPHENOXYLATE-ATROPINE 2.5-0.025 MG PO TABS: 2.0000 | ORAL_TABLET | Freq: Every day | ORAL | 2 refills | Status: DC | PRN

## 2016-09-24 MED ORDER — ZOLEDRONIC ACID 4 MG/100ML IV SOLN
4.0000 mg | Freq: Once | INTRAVENOUS | Status: AC
  Administered 2016-09-24: 4 mg via INTRAVENOUS
  Filled 2016-09-24: qty 100

## 2016-09-24 MED ORDER — MAGNESIUM SULFATE 4 GM/100ML IV SOLN
4.0000 g | Freq: Once | INTRAVENOUS | Status: AC
  Administered 2016-09-24: 4 g via INTRAVENOUS
  Filled 2016-09-24: qty 100

## 2016-09-24 MED ORDER — SODIUM CHLORIDE 0.9 % IV SOLN
Freq: Once | INTRAVENOUS | Status: AC
  Administered 2016-09-24: 11:00:00 via INTRAVENOUS
  Filled 2016-09-24: qty 1000

## 2016-09-24 NOTE — Assessment & Plan Note (Signed)
Multiple myeloma IgA lambda as per the records status post autologous stem cell transplant in December 2016 currently on Revlimid 34m 3 w On & 1 week OFF.  # Clinically no evidence of progression at this time. DEC 2017- M- protein- NEGATIVE/ K-L=Normal. Skeletal survey- Normal. Myeloma labs from today pending.   # Chronic diarrhea- question Revlimid; On cholestyramine; along with lomotil. Improved  # weight loss- ~ 10 pounds in last 3-4 months- recommend dietary referral.   # fatigue- chronic- check TSH today.   # Chronic mild-mod thrombocytopenia 120/ ? Revlimid vs others. Monitor for now.   # Bone lesions recommend Zometa every 4 weeks; Ca- 8.5 today. Ca+ vit D BID.   #  Hypomagnesemia- 0.8 g. Recommend IV mag.  # Chronic back pain- pain management follow-up; on fentanyl  25 mc/ [Dr.James; [facility]; [place]]; no BTP.   # every 4 weeks/ BMP and magnesium/ zometa; follow up with me in 3 months/zometa; labs- myeloma panel/ka-lam 1 week prior.

## 2016-09-24 NOTE — Progress Notes (Signed)
[place] NOTE  Patient Care Team: Glean Hess, MD as PCP - General (Internal Medicine) Cammie Sickle, MD as Consulting Physician (Oncology) Jodell Cipro, MD as Referring Physician (Pain Medicine) Corey Skains, MD as Consulting Physician (Cardiology)  CHIEF COMPLAINTS/PURPOSE OF CONSULTATION:  Multiple myeloma  # MULTIPLE MYELOMA-   Oncology History   # June 2017- [facility] MULTIPLE MYELOMA [BMBx- 80% plasma cells; [facility]]; Lamda light chain 1340; s/p RVD   # 14th DEC 2016- Auto-Stem cell transplant [[facility];Dr.Hertzig]; rev [dose reduced sec to [place]  # Maintenance Revlimid- 31m 3 w-ON & 1 week OFF  # Bone lesions-numerous ~58mlytic lesions in Thoracic spine- on Zometa  # June 2016- Proteinuria [3gm/day]/ CKD III   # chronic back pain/ anxiety/ PN   # "Colitis" s/p multiple EGD/Colonoscopies     Multiple [place]oma in remission ([facility]  04/02/2016 Initial Diagnosis    Multiple myeloma in remission ([facility]      HISTORY OF PRESENTING ILLNESS:  Sarah Thornsberry89.o.  female of her history of multiple myeloma diagnosed June 2016 currently on maintenance Revlimid after bone marrow transplant Is here for follow-up.  Patient is currently on immunization schedule as per the recommendations from the bone marrow [facility]  Complains of chronic fatigue. Not any worse.  She has chronic diarrhea for which she has been taking Lomotil as needed. Although she has chronic probable irritable bowel syndrome. Chronic tingling and numbness of extremities. Not any worse. Complains of fatigue.  ROS: A complete 10 point review of system is done which is negative except mentioned above in history of present illness  MEDICAL HISTORY:  Past Medical History:  Diagnosis Date  . Anemia   . Anxiety   . Arthritis   .  Bicuspid aortic valve   . CHF (congestive heart failure) ([facility]  . CKD (chronic kidney disease) stage 3, GFR 30-59 ml/min   . Depression   . Diabetes mellitus ([facility]  . Dizziness   . Fatty liver   . Frequent falls   . GERD (gastroesophageal reflux disease)   . Gout   . Heart murmur   . History of blood transfusion   . History of bone marrow transplant ([facility] [place]  . History of uterine fibroid   . Hypertension   . Hypomagnesemia   . Multiple myeloma ([facility]  . Renal cyst     SURGICAL HISTORY: Past Surgical History:  Procedure Laterality Date  . 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 CARDIAC ELECTROPHYSIOLOGY MAPPING AND ABLATION    . CARPAL TUNNEL RELEASE Bilateral   . CHOLECYSTECTOMY  2008  . FOOT SURGERY Bilateral   . INCONTINENCE SURGERY  2009  . PARTIAL HYSTERECTOMY  03/1996   fibroids  . TONSILLECTOMY  2007    [place] quit smoking 93; occasional alcohol; lives [place] with step sister.  Social History   Social History  . Marital status: Widowed    Spouse name: N/A  . Number of children: N/A  . Years of education: N/A   Occupational History  . Not on file.   Social History Main Topics  . Smoking status: Former Smoker    Packs/day: 1.00    Years: 20.00    Types: Cigarettes    Quit date: 07/02/1991  .  Smokeless tobacco: Never Used  . Alcohol use 1.2 oz/week    1 Glasses of wine, 1 Cans of beer per week     Comment: occassional  . Drug use: No  . Sexual activity: No   Other Topics Concern  . Not on file   Social History Narrative  . No narrative on file    FAMILY HISTORY: Family History  Problem Relation Age of Onset  . Colon cancer Father   . Renal Disease Father   . Diabetes Mellitus II Father   . Melanoma Paternal Grandmother   . Breast cancer Maternal Aunt   . Anemia Mother   . Heart disease Mother   . Heart failure Mother   . Renal Disease Mother   . Congestive Heart Failure Mother   . Heart  disease Maternal Uncle   . Throat cancer Maternal Uncle   . Lung cancer Maternal Uncle   . Liver disease Maternal Uncle   . Heart failure Maternal Uncle   . Hearing loss Son 18    Suicide     ALLERGIES:  is allergic to oxycodone-acetaminophen; benadryl [diphenhydramine]; morphine; ondansetron; and tylenol [acetaminophen].  MEDICATIONS:  Current Outpatient Prescriptions  Medication Sig Dispense Refill  . acyclovir (ZOVIRAX) 200 MG capsule Take 2 capsules (400 mg total) by mouth 3 (three) times daily. 180 capsule 5  . bisoprolol (ZEBETA) 10 MG tablet Take 1 tablet (10 mg total) by mouth daily. 30 tablet 5  . Calcium Carbonate-Vit D-Min (CALCIUM 600+D PLUS MINERALS) 600-400 MG-UNIT TABS Take by mouth.    . cholestyramine (QUESTRAN) 4 g packet Take 4 g once daily. 60 each 3  . diphenoxylate-atropine (LOMOTIL) 2.5-0.025 MG tablet Take 2 tablets by mouth daily as needed for diarrhea or loose stools. 30 tablet 2  . DULoxetine (CYMBALTA) 60 MG capsule TAKE 1 CAPSULE(60 MG) BY MOUTH TWICE DAILY 60 capsule 0  . fentaNYL (DURAGESIC - DOSED MCG/HR) 25 MCG/HR patch Place 25 mcg onto the skin every 3 (three) days.    . fexofenadine (ALLEGRA) 180 MG tablet Take 1 tablet (180 mg total) by mouth daily. 30 tablet 1  . fluticasone (FLONASE) 50 MCG/ACT nasal spray Place 2 sprays into both nostrils daily. 16 g 0  . furosemide (LASIX) 20 MG tablet Take 0.5 tablets (10 mg total) by mouth daily as needed. 30 tablet 0  . hydrOXYzine (ATARAX/VISTARIL) 10 MG tablet Take 1 tablet (10 mg total) by mouth 3 (three) times daily as needed. 90 tablet 1  . montelukast (SINGULAIR) 10 MG tablet Take 1 tablet (10 mg total) by mouth at bedtime. 30 tablet 5  . Multiple Vitamins-Minerals (HAIR/SKIN/NAILS/BIOTIN PO) Take 1 capsule by mouth 3 (three) times daily.    Marland Kitchen omeprazole (PRILOSEC) 40 MG capsule Take 1 capsule (40 mg total) by mouth daily. (Patient taking differently: Take 40 mg by mouth 2 (two) times daily. ) 30 capsule 5   . promethazine (PHENERGAN) 25 MG tablet Take 25 mg by mouth as needed.     Marland Kitchen REVLIMID 5 MG capsule Take 1 capsule (5 mg total) by mouth daily. X 21 days.  1 week off 21 capsule 4  . tiZANidine (ZANAFLEX) 4 MG tablet Take 1 tablet (4 mg total) by mouth every 8 (eight) hours as needed. Muscle relaxer for back pain 90 tablet 1  . zaleplon (SONATA) 10 MG capsule Take 1 capsule (10 mg total) by mouth at bedtime as needed for sleep. 30 capsule 3  . Zoledronic Acid (ZOMETA IV) Inject into  the vein every 30 (thirty) days.    Marland Kitchen allopurinol (ZYLOPRIM) 100 MG tablet Take 1 tablet (100 mg total) by mouth daily. (Patient not taking: Reported on 09/24/2016) 30 tablet 5   No current facility-administered medications for this visit.       Marland Kitchen  PHYSICAL EXAMINATION: ECOG PERFORMANCE STATUS: 1 - Symptomatic but completely ambulatory  Vitals:   09/24/16 1018  BP: (!) 147/70  Pulse: 63  Resp: 18  Temp: 97.6 F (36.4 C)   Filed Weights   09/24/16 1018  Weight: 128 lb 4.9 oz (58.2 kg)    GENERAL: Well-nourished well-developed; Alert, no distress and comfortable.  EYES: no pallor or icterus OROPHARYNX: no thrush or ulceration; good dentition  NECK: supple, no masses felt LYMPH:  no palpable lymphadenopathy in the cervical, axillary or inguinal regions LUNGS: clear to auscultation and  No wheeze or crackles HEART/CVS: regular rate & rhythm and no murmurs; No lower extremity edema ABDOMEN: abdomen soft, non-tender and normal bowel sounds Musculoskeletal:no cyanosis of digits and no clubbing  PSYCH: alert & oriented x 3 with fluent speech NEURO: no focal motor/sensory deficits SKIN:  no rashes or significant lesions  LABORATORY DATA:  I have reviewed the data as listed Lab Results  Component Value Date   WBC 3.6 09/24/2016   HGB 11.3 (L) 09/24/2016   HCT 32.8 (L) 09/24/2016   MCV 90.6 09/24/2016   PLT 120 (L) 09/24/2016    Recent Labs  05/21/16 1619 06/18/16 1115 07/23/16 0830  08/28/16 1032 09/24/16 1006  NA 137 136 140 135 137  K 3.6 3.4* 3.7 3.9 3.9  CL 103 102 106 98* 106  CO2 23 25 25 27 24   GLUCOSE 103* 215* 134* 119* 118*  BUN 16 25* 16 16 22*  CREATININE 1.13* 1.25* 1.15* 1.00 1.01*  CALCIUM 8.4* 8.2* 7.5* 9.2 9.1  GFRNONAA 52* 46* 51* >60 60*  GFRAA >60 53* 59* >60 >60  PROT 7.1 7.0  --   --  7.1  ALBUMIN 4.5 4.5  --   --  4.4  AST 33 26  --   --  29  ALT 31 27  --   --  24  ALKPHOS 87 77  --   --  85  BILITOT 0.6 0.5  --   --  0.7    RADIOGRAPHIC STUDIES: I have personally reviewed the radiological images as listed and agreed with the findings in the report. No results found.  ASSESSMENT & PLAN:   Multiple myeloma in remission ([place]) Multiple myeloma IgA lambda as per the records status post autologous stem cell transplant in December 2016 currently on Revlimid 45m 3 w On & 1 week OFF.  # Clinically no evidence of progression at this time. DEC 2017- M- protein- NEGATIVE/ K-L=Normal. Skeletal survey- Normal. Myeloma labs from today pending.   # Chronic diarrhea- question Revlimid; On cholestyramine; along with lomotil. Improved  # weight loss- ~ 10 pounds in last 3-4 months- recommend dietary referral.   # fatigue- chronic- check TSH today.   # Chronic mild-mod thrombocytopenia 120/ ? Revlimid vs others. Monitor for now.   # Bone lesions recommend Zometa every 4 weeks; Ca- 8.5 today. Ca+ vit D BID.   #  Hypomagnesemia- 0.8 g. Recommend IV mag.  # Chronic back pain- pain management follow-up; on fentanyl  25 mc/ [Dr.James; [facility]; [place]]; no BTP.   # every 4 weeks/ BMP and magnesium/ zometa; follow up with me in 3 months/zometa; labs- myeloma panel/ka-lam  1 week prior.    Cammie Sickle, MD 09/24/2016 1:36 PM

## 2016-09-24 NOTE — Telephone Encounter (Signed)
Please add TSH to pt's labs drwan today- Thx

## 2016-09-24 NOTE — Telephone Encounter (Signed)
Currently in process. Spoke with Sarah [name] in [place] lab to add specimen at 1016 am today.

## 2016-09-24 NOTE — Patient Instructions (Signed)
Zoledronic Acid injection (Hypercalcemia, Oncology) What is this medicine? ZOLEDRONIC ACID (ZOE le dron ik AS id) lowers the amount of calcium loss from bone. It is used to treat too much calcium in your blood from cancer. It is also used to prevent complications of cancer that has spread to the bone. This medicine may be used for other purposes; ask your health care provider or pharmacist if you have questions. COMMON BRAND NAME(S): Zometa What should I tell my health care provider before I take this medicine? They need to know if you have any of these conditions: -aspirin-sensitive asthma -cancer, especially if you are receiving medicines used to treat cancer -dental disease or wear dentures -infection -kidney disease -receiving corticosteroids like dexamethasone or prednisone -an unusual or allergic reaction to zoledronic acid, other medicines, foods, dyes, or preservatives -pregnant or trying to get pregnant -breast-feeding How should I use this medicine? This medicine is for infusion into a vein. It is given by a health care professional in a hospital or clinic setting. Talk to your pediatrician regarding the use of this medicine in children. Special care may be needed. Overdosage: If you think you have taken too much of this medicine contact a poison control center or emergency room at once. NOTE: This medicine is only for you. Do not share this medicine with others. What if I miss a dose? It is important not to miss your dose. Call your doctor or health care professional if you are unable to keep an appointment. What may interact with this medicine? -certain antibiotics given by injection -NSAIDs, medicines for pain and inflammation, like ibuprofen or naproxen -some diuretics like bumetanide, furosemide -teriparatide -thalidomide This list may not describe all possible interactions. Give your health care provider a list of all the medicines, herbs, non-prescription drugs, or  dietary supplements you use. Also tell them if you smoke, drink alcohol, or use illegal drugs. Some items may interact with your medicine. What should I watch for while using this medicine? Visit your doctor or health care professional for regular checkups. It may be some time before you see the benefit from this medicine. Do not stop taking your medicine unless your doctor tells you to. Your doctor may order blood tests or other tests to see how you are doing. Women should inform their doctor if they wish to become pregnant or think they might be pregnant. There is a potential for serious side effects to an unborn child. Talk to your health care professional or pharmacist for more information. You should make sure that you get enough calcium and vitamin D while you are taking this medicine. Discuss the foods you eat and the vitamins you take with your health care professional. Some people who take this medicine have severe bone, joint, and/or muscle pain. This medicine may also increase your risk for jaw problems or a broken thigh bone. Tell your doctor right away if you have severe pain in your jaw, bones, joints, or muscles. Tell your doctor if you have any pain that does not go away or that gets worse. Tell your dentist and dental surgeon that you are taking this medicine. You should not have major dental surgery while on this medicine. See your dentist to have a dental exam and fix any dental problems before starting this medicine. Take good care of your teeth while on this medicine. Make sure you see your dentist for regular follow-up appointments. What side effects may I notice from receiving this medicine? Side effects that   you should report to your doctor or health care professional as soon as possible: -allergic reactions like skin rash, itching or hives, swelling of the face, lips, or tongue -anxiety, confusion, or depression -breathing problems -changes in vision -eye pain -feeling faint or  lightheaded, falls -jaw pain, especially after dental work -mouth sores -muscle cramps, stiffness, or weakness -redness, blistering, peeling or loosening of the skin, including inside the mouth -trouble passing urine or change in the amount of urine Side effects that usually do not require medical attention (report to your doctor or health care professional if they continue or are bothersome): -bone, joint, or muscle pain -constipation -diarrhea -fever -hair loss -irritation at site where injected -loss of appetite -nausea, vomiting -stomach upset -trouble sleeping -trouble swallowing -weak or tired This list may not describe all possible side effects. Call your doctor for medical advice about side effects. You may report side effects to FDA at 1-800-FDA-1088. Where should I keep my medicine? This drug is given in a hospital or clinic and will not be stored at home. NOTE: This sheet is a summary. It may not cover all possible information. If you have questions about this medicine, talk to your doctor, pharmacist, or health care provider.  2018 Elsevier/Gold Standard (2013-11-13 14:19:39) Magnesium Sulfate injection What is this medicine? MAGNESIUM SULFATE (mag NEE zee um SUL fate) is an electrolyte injection commonly used to treat low magnesium levels in your blood. It is also used to prevent or control seizures in women with preeclampsia or eclampsia. This medicine may be used for other purposes; ask your health care provider or pharmacist if you have questions. What should I tell my health care provider before I take this medicine? They need to know if you have any of these conditions: -heart disease -history of irregular heart beat -kidney disease -an unusual or allergic reaction to magnesium sulfate, medicines, foods, dyes, or preservatives -pregnant or trying to get pregnant -breast-feeding How should I use this medicine? This medicine is for infusion into a vein. It is  given by a health care professional in a hospital or clinic setting. Talk to your pediatrician regarding the use of this medicine in children. While this drug may be prescribed for selected conditions, precautions do apply. Overdosage: If you think you have taken too much of this medicine contact a poison control center or emergency room at once. NOTE: This medicine is only for you. Do not share this medicine with others. What if I miss a dose? This does not apply. What may interact with this medicine? This medicine may interact with the following medications: -certain medicines for anxiety or sleep -certain medicines for seizures like phenobarbital -digoxin -medicines that relax muscles for surgery -narcotic medicines for pain This list may not describe all possible interactions. Give your health care provider a list of all the medicines, herbs, non-prescription drugs, or dietary supplements you use. Also tell them if you smoke, drink alcohol, or use illegal drugs. Some items may interact with your medicine. What should I watch for while using this medicine? Your condition will be monitored carefully while you are receiving this medicine. You may need blood work done while you are receiving this medicine. What side effects may I notice from receiving this medicine? Side effects that you should report to your doctor or health care professional as soon as possible: -allergic reactions like skin rash, itching or hives, swelling of the face, lips, or tongue -facial flushing -muscle weakness -signs and symptoms of low blood  pressure like dizziness; feeling faint or lightheaded, falls; unusually weak or tired -signs and symptoms of a dangerous change in heartbeat or heart rhythm like chest pain; dizziness; fast or irregular heartbeat; palpitations; breathing problems -sweating This list may not describe all possible side effects. Call your doctor for medical advice about side effects. You may  report side effects to FDA at 1-800-FDA-1088. Where should I keep my medicine? This drug is given in a hospital or clinic and will not be stored at home. NOTE: This sheet is a summary. It may not cover all possible information. If you have questions about this medicine, talk to your doctor, pharmacist, or health care provider.  2018 Elsevier/Gold Standard (2016-01-03 12:31:42)

## 2016-09-25 LAB — MULTIPLE MYELOMA PANEL, SERUM
ALBUMIN SERPL ELPH-MCNC: 4.2 g/dL (ref 2.9–4.4)
ALBUMIN/GLOB SERPL: 1.7 (ref 0.7–1.7)
ALPHA 1: 0.3 g/dL (ref 0.0–0.4)
Alpha2 Glob SerPl Elph-Mcnc: 0.8 g/dL (ref 0.4–1.0)
B-Globulin SerPl Elph-Mcnc: 0.9 g/dL (ref 0.7–1.3)
Gamma Glob SerPl Elph-Mcnc: 0.6 g/dL (ref 0.4–1.8)
Globulin, Total: 2.6 g/dL (ref 2.2–3.9)
IGA: 171 mg/dL (ref 87–352)
IGM, SERUM: 17 mg/dL — AB (ref 26–217)
IgG (Immunoglobin G), Serum: 557 mg/dL — ABNORMAL LOW (ref 700–1600)
Total Protein ELP: 6.8 g/dL (ref 6.0–8.5)

## 2016-09-27 ENCOUNTER — Other Ambulatory Visit: Payer: Self-pay | Admitting: *Deleted

## 2016-10-03 ENCOUNTER — Inpatient Hospital Stay: Payer: Managed Care, Other (non HMO) | Attending: Internal Medicine

## 2016-10-03 DIAGNOSIS — C9000 Multiple myeloma not having achieved remission: Secondary | ICD-10-CM | POA: Insufficient documentation

## 2016-10-03 DIAGNOSIS — Z79899 Other long term (current) drug therapy: Secondary | ICD-10-CM | POA: Insufficient documentation

## 2016-10-03 NOTE — Progress Notes (Signed)
Nutrition Assessment   Reason for Assessment:   Referral from Dr. Rogue Bussing due to diarrhea, poor appetite and weight loss  ASSESSMENT:  61 year old female with multiple myeloma.  Past medical history of CHF, CKD, DM, GERD, HTN  Patient seen in clinic this pm.  Reports that has had chronic nausea for the last 10 year after having gallbladder removed.  Patient reports that phenergan helps but does not take it away completely but has tried numerous medications for nausea.  Patient reports that she has chronic diarrhea but is worse on revilimid (smells different when on revilimid than off) Patient reports that she has had a full GI work up in [place] prior to diagnosis of multiple myeloma (ie food allergy testing, etc).  Patient reports it does not matter what food she eats she will have diarrhea.  Diarrhea is better after recently starting pain patch.  Lomotil and cholestyramine help.  Reports has diarrhea 2-3 times per day when taking lomotil.    Reports typical intake is cereal for breakfast, potato chips for lunch and fish or meat and vegetable for dinner.  Drinks premier protein shake daily.    Patient reports fatigue as well  Nutrition Focused Physical Exam: deferred at this time  Medications: phenergan, cholestyramine, lomotil, calcium vit D, prilosec  Labs: glucose 118, BUN 22, creatinine 1.01, Mag 1.1  Anthropometrics:   Height: 63 inches Weight: 128 lb  UBW: 160-165 lb (2006) BMI: 22 Noted 139 lb on 06/18/2016.  8% weight loss in the last 3 1/2 months.    Patient reports she does not want to gain weight but wants to stay around current weight   Estimated Energy Needs  Kcals: 1450-1700 calories/d Protein: 70-87 g/d Fluid: 1.7 L/d  NUTRITION DIAGNOSIS: Inadequate oral intake related to fatigue, poor appetite, diarrhea, nausea as evidenced by 8% weight loss in the last 3 1/2 months   MALNUTRITION DIAGNOSIS: Patient meets criteria for severe malnutrition in chronic illness  as evidenced by 8% weight loss in the last 3 1/2 months and eating < or equal to 75% of energy intake for > or equal to 1 month   INTERVENTION:   Discussed importance of nutrition for overall health.  Encouraged good sources of calories and protein (ie nutrient dense foods) to prevent further weight loss. Encouraged small frequent meals and to include food sources of protein at every meal. Discussed oral nutrition supplements and calorie and protein amount of each.  Encouraged higher calorie supplement to maintain weight.   Discussed nutritional dense easy to prepare foods as patient with fatigue.      MONITORING, EVALUATION, GOAL: Patient will consume adequate calories and protein to maintain nutrition   NEXT VISIT: Phone call in 1 month (seen at [facility] clinic)  Lamaya Hyneman B. Zenia Resides, [place], [place] Registered Dietitian [phone] (pager)

## 2016-10-07 ENCOUNTER — Encounter: Payer: Self-pay | Admitting: Internal Medicine

## 2016-10-10 DIAGNOSIS — I447 Left bundle-branch block, unspecified: Secondary | ICD-10-CM | POA: Insufficient documentation

## 2016-10-10 LAB — KAPPA/LAMBDA LIGHT CHAINS
Kappa free light chain: 12.5 mg/L (ref 3.3–19.4)
Kappa, lambda light chain ratio: 0.86 (ref 0.26–1.65)
LAMDA FREE LIGHT CHAINS: 14.6 mg/L (ref 5.7–26.3)

## 2016-10-14 ENCOUNTER — Ambulatory Visit (INDEPENDENT_AMBULATORY_CARE_PROVIDER_SITE_OTHER): Payer: Managed Care, Other (non HMO) | Admitting: Internal Medicine

## 2016-10-14 VITALS — BP 112/68 | HR 75 | Temp 99.0°F | Ht 63.0 in | Wt 124.6 lb

## 2016-10-14 DIAGNOSIS — J111 Influenza due to unidentified influenza virus with other respiratory manifestations: Secondary | ICD-10-CM

## 2016-10-14 DIAGNOSIS — R69 Illness, unspecified: Secondary | ICD-10-CM

## 2016-10-14 NOTE — Patient Instructions (Signed)
Increase fluids to 80-100 oz per day  Take ibuprofen every 6 hours for body aches and fever

## 2016-10-14 NOTE — Progress Notes (Signed)
Date:  10/14/2016   Name:  Sarah [name]   DOB:  [birth year]   MRN:  [id number]   Chief Complaint: Sore Throat (X 4 days. Low grade fever- 100.6 with diarrhea. Nausea is bad. "When swallow it hurts, tongue and lips are hurting too." Feels like she has the flu. No vomiting.Marland Kitchen  ) Sore Throat   This is a new problem. The current episode started in the past 7 days. The problem has been unchanged. Neither side of throat is experiencing more pain than the other. The maximum temperature recorded prior to her arrival was 100.4 - 100.9 F. Associated symptoms include diarrhea, swollen glands and trouble swallowing. Pertinent negatives include no ear pain, headaches, shortness of breath or vomiting.      Review of Systems  Constitutional: Positive for chills, fatigue and fever.  HENT: Positive for sore throat and trouble swallowing. Negative for ear pain and sinus pain.   Eyes: Negative for visual disturbance.  Respiratory: Negative for chest tightness, shortness of breath and wheezing.   Cardiovascular: Negative for chest pain.  Gastrointestinal: Positive for diarrhea and nausea. Negative for vomiting.  Genitourinary: Negative for dysuria.  Skin: Negative for rash and wound.  Neurological: Positive for light-headedness. Negative for dizziness and headaches.  Psychiatric/Behavioral: Negative for sleep disturbance.    Patient Active Problem List   Diagnosis Date Noted  . Sinus congestion 05/30/2016  . Environmental and seasonal allergies 04/19/2016  . Insomnia 04/19/2016  . Bicuspid aortic valve 04/19/2016  . Asthma 04/19/2016  . Aortic regurgitation 04/19/2016  . History of CHF (congestive heart failure) 04/19/2016  . DM (diabetes mellitus), type 2 with renal complications ([place]) 70/17/7939  . Anemia 04/12/2016  . GERD (gastroesophageal reflux disease) 04/12/2016  . Depression with anxiety 04/12/2016  . Irritable bowel syndrome (IBS) 04/12/2016  . Hepatic steatosis 04/12/2016  .  Multiple myeloma in remission ([place]) 04/02/2016  . Abnormal stress test 02/14/2016  . 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[place]) 07/06/2015  . Disequilibrium 01/14/2013    Prior to Admission medications   Medication Sig Start Date End Date Taking? Authorizing Provider  acyclovir (ZOVIRAX) 200 MG capsule Take 2 capsules (400 mg total) by mouth 3 (three) times daily. 05/30/16   Glean Hess, MD  allopurinol (ZYLOPRIM) 100 MG tablet Take 1 tablet (100 mg total) by mouth daily. Patient not taking: Reported on 09/24/2016 05/30/16   Glean Hess, MD  bisoprolol (ZEBETA) 10 MG tablet Take 1 tablet (10 mg total) by mouth daily. 05/30/16   Glean Hess, MD  Calcium Carbonate-Vit D-Min (CALCIUM 600+D PLUS MINERALS) 600-400 MG-UNIT TABS Take by mouth.    Historical Provider, MD  cholestyramine (QUESTRAN) 4 g packet Take 4 g once daily. 06/18/16   Cammie Sickle, MD  diphenoxylate-atropine (LOMOTIL) 2.5-0.025 MG tablet Take 2 tablets by mouth daily as needed for diarrhea or loose stools. 09/24/16   Cammie Sickle, MD  DULoxetine (CYMBALTA) 60 MG capsule TAKE 1 CAPSULE(60 MG) BY MOUTH TWICE DAILY 06/17/16   Glean Hess, MD  fentaNYL (DURAGESIC - DOSED MCG/HR) 25 MCG/HR patch Place 25 mcg onto the skin every 3 (three) days. 08/12/16   Historical Provider, MD  fexofenadine (ALLEGRA) 180 MG tablet Take 1 tablet (180 mg total) by mouth daily. 05/30/16   Glean Hess, MD  fluticasone (FLONASE) 50 MCG/ACT nasal spray Place 2 sprays into both nostrils daily. 05/30/16   Glean Hess, MD  furosemide (LASIX) 20 MG tablet Take 0.5 tablets (10  mg total) by mouth daily as needed. 05/30/16   Glean Hess, MD  hydrOXYzine (ATARAX/VISTARIL) 10 MG tablet Take 1 tablet (10 mg total) by mouth 3 (three) times daily as needed. 05/30/16   Glean Hess, MD  montelukast (SINGULAIR) 10 MG tablet Take 1 tablet (10 mg total) by mouth at bedtime. 05/30/16   Glean Hess, MD  Multiple  Vitamins-Minerals (HAIR/SKIN/NAILS/BIOTIN PO) Take 1 capsule by mouth 3 (three) times daily.    Historical Provider, MD  omeprazole (PRILOSEC) 40 MG capsule Take 1 capsule (40 mg total) by mouth daily. Patient taking differently: Take 40 mg by mouth 2 (two) times daily.  05/30/16   Glean Hess, MD  promethazine (PHENERGAN) 25 MG tablet Take 25 mg by mouth as needed.  02/09/16   Historical Provider, MD  REVLIMID 5 MG capsule Take 1 capsule (5 mg total) by mouth daily. X 21 days.  1 week off 06/25/16   Sindy Guadeloupe, MD  tiZANidine (ZANAFLEX) 4 MG tablet Take 1 tablet (4 mg total) by mouth every 8 (eight) hours as needed. Muscle relaxer for back pain 05/30/16   Glean Hess, MD  zaleplon (SONATA) 10 MG capsule Take 1 capsule (10 mg total) by mouth at bedtime as needed for sleep. 09/02/16   Glean Hess, MD  Zoledronic Acid (ZOMETA IV) Inject into the vein every 30 (thirty) days.    Historical Provider, MD    Allergies  Allergen Reactions  . Oxycodone-Acetaminophen   . Benadryl [Diphenhydramine] Palpitations  . Morphine Itching and Rash  . Ondansetron Diarrhea    CAUSES PATIENT DIARRHEA CAUSES PATIENT DIARRHEA  . Tylenol [Acetaminophen] Itching and Rash    Past Surgical History:  Procedure Laterality Date  . 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 CARDIAC ELECTROPHYSIOLOGY MAPPING AND ABLATION    . CARPAL TUNNEL RELEASE Bilateral   . CHOLECYSTECTOMY  2008  . FOOT SURGERY Bilateral   . INCONTINENCE SURGERY  2009  . PARTIAL HYSTERECTOMY  03/1996   fibroids  . TONSILLECTOMY  2007    Social History  Substance Use Topics  . Smoking status: Former Smoker    Packs/day: 1.00    Years: 20.00    Types: Cigarettes    Quit date: 07/02/1991  . Smokeless tobacco: Never Used  . Alcohol use 1.2 oz/week    1 Glasses of wine, 1 Cans of beer per week     Comment: occassional     Medication list has been reviewed and updated.   Physical Exam  Constitutional: She is oriented to  person, place, and time. She appears well-developed. No distress.  HENT:  Head: Normocephalic and atraumatic.  Right Ear: Tympanic membrane and ear canal normal.  Left Ear: Tympanic membrane and ear canal normal.  Nose: Right sinus exhibits no maxillary sinus tenderness and no frontal sinus tenderness. Left sinus exhibits no maxillary sinus tenderness and no frontal sinus tenderness.  Mouth/Throat: No posterior oropharyngeal edema or posterior oropharyngeal erythema.  Cardiovascular: Normal rate, regular rhythm and normal heart sounds.   Pulmonary/Chest: Effort normal and breath sounds normal. No respiratory distress. She has no wheezes.  Abdominal: Soft. Bowel sounds are normal. She exhibits no distension and no mass. There is no tenderness.  Musculoskeletal: Normal range of motion.  Neurological: She is alert and oriented to person, place, and time.  Skin: Skin is warm, dry and intact. No rash noted. She is not diaphoretic.  Psychiatric: She has a normal mood and affect. Her speech  is normal and behavior is normal. Thought content normal.  Nursing note and vitals reviewed.   BP 112/68 (BP Location: Right Arm, Patient Position: Sitting, Cuff Size: Normal)   Pulse 75   Temp 99 F (37.2 C)   Ht 5' 3"  (1.6 m)   Wt 124 lb 9.6 oz (56.5 kg)   SpO2 99%   BMI 22.07 kg/m   Assessment and Plan: 1. Influenza-like illness Push fluids Begin ibuprofen every 6 hours Continue Lomotil, phenergan and cholestyramine   No orders of the defined types were placed in this encounter.   Halina Maidens, MD [facility]  10/14/2016

## 2016-10-16 ENCOUNTER — Emergency Department
Admission: EM | Admit: 2016-10-16 | Discharge: 2016-10-16 | Disposition: A | Discharge status: Home / Self Care | Payer: Managed Care, Other (non HMO) | Attending: Student in an Organized Health Care Education/Training Program | Admitting: Student in an Organized Health Care Education/Training Program

## 2016-10-16 ENCOUNTER — Emergency Department: Payer: Managed Care, Other (non HMO)

## 2016-10-16 ENCOUNTER — Encounter: Payer: Self-pay | Admitting: Emergency Medicine

## 2016-10-16 DIAGNOSIS — K529 Noninfective gastroenteritis and colitis, unspecified: Secondary | ICD-10-CM | POA: Diagnosis not present

## 2016-10-16 DIAGNOSIS — E1122 Type 2 diabetes mellitus with diabetic chronic kidney disease: Secondary | ICD-10-CM | POA: Insufficient documentation

## 2016-10-16 DIAGNOSIS — Z87891 Personal history of nicotine dependence: Secondary | ICD-10-CM | POA: Insufficient documentation

## 2016-10-16 DIAGNOSIS — Z79899 Other long term (current) drug therapy: Secondary | ICD-10-CM | POA: Insufficient documentation

## 2016-10-16 DIAGNOSIS — R197 Diarrhea, unspecified: Secondary | ICD-10-CM

## 2016-10-16 DIAGNOSIS — I509 Heart failure, unspecified: Secondary | ICD-10-CM | POA: Insufficient documentation

## 2016-10-16 DIAGNOSIS — I130 Hypertensive heart and chronic kidney disease with heart failure and stage 1 through stage 4 chronic kidney disease, or unspecified chronic kidney disease: Secondary | ICD-10-CM | POA: Diagnosis not present

## 2016-10-16 DIAGNOSIS — N183 Chronic kidney disease, stage 3 (moderate): Secondary | ICD-10-CM | POA: Diagnosis not present

## 2016-10-16 DIAGNOSIS — R1031 Right lower quadrant pain: Secondary | ICD-10-CM | POA: Insufficient documentation

## 2016-10-16 LAB — CBC
HEMATOCRIT: 32.1 % — AB (ref 35.0–47.0)
Hemoglobin: 11.1 g/dL — ABNORMAL LOW (ref 12.0–16.0)
MCH: 30.8 pg (ref 26.0–34.0)
MCHC: 34.7 g/dL (ref 32.0–36.0)
MCV: 88.6 fL (ref 80.0–100.0)
Platelets: 116 10*3/uL — ABNORMAL LOW (ref 150–440)
RBC: 3.62 MIL/uL — AB (ref 3.80–5.20)
RDW: 14.4 % (ref 11.5–14.5)
WBC: 3 10*3/uL — AB (ref 3.6–11.0)

## 2016-10-16 LAB — COMPREHENSIVE METABOLIC PANEL
ALT: 26 U/L (ref 14–54)
AST: 25 U/L (ref 15–41)
Albumin: 4.4 g/dL (ref 3.5–5.0)
Alkaline Phosphatase: 77 U/L (ref 38–126)
Anion gap: 8 (ref 5–15)
BUN: 14 mg/dL (ref 6–20)
CO2: 24 mmol/L (ref 22–32)
Calcium: 8.8 mg/dL — ABNORMAL LOW (ref 8.9–10.3)
Chloride: 108 mmol/L (ref 101–111)
Creatinine, Ser: 0.96 mg/dL (ref 0.44–1.00)
Glucose, Bld: 99 mg/dL (ref 65–99)
POTASSIUM: 2.6 mmol/L — AB (ref 3.5–5.1)
Sodium: 140 mmol/L (ref 135–145)
Total Bilirubin: 0.5 mg/dL (ref 0.3–1.2)
Total Protein: 7.2 g/dL (ref 6.5–8.1)

## 2016-10-16 LAB — URINALYSIS, COMPLETE (UACMP) WITH MICROSCOPIC
BACTERIA UA: NONE SEEN
Bilirubin Urine: NEGATIVE
GLUCOSE, UA: NEGATIVE mg/dL
Hgb urine dipstick: NEGATIVE
KETONES UR: NEGATIVE mg/dL
Nitrite: NEGATIVE
PH: 6 (ref 5.0–8.0)
PROTEIN: NEGATIVE mg/dL
Specific Gravity, Urine: 1.012 (ref 1.005–1.030)

## 2016-10-16 LAB — C DIFFICILE QUICK SCREEN W PCR REFLEX
C DIFFICILE (CDIFF) INTERP: NOT DETECTED
C DIFFICILE (CDIFF) TOXIN: NEGATIVE
C Diff antigen: NEGATIVE

## 2016-10-16 LAB — LIPASE, BLOOD: LIPASE: 23 U/L (ref 11–51)

## 2016-10-16 MED ORDER — PROMETHAZINE HCL 25 MG PO TABS
ORAL_TABLET | ORAL | Status: AC
  Administered 2016-10-16: 50 mg via ORAL
  Filled 2016-10-16: qty 2

## 2016-10-16 MED ORDER — PROMETHAZINE HCL 25 MG PO TABS
50.0000 mg | ORAL_TABLET | Freq: Once | ORAL | Status: AC
  Administered 2016-10-16: 50 mg via ORAL

## 2016-10-16 MED ORDER — POTASSIUM CHLORIDE CRYS ER 20 MEQ PO TBCR: 40.0000 meq | EXTENDED_RELEASE_TABLET | Freq: Once | ORAL | Status: DC

## 2016-10-16 MED ORDER — MAGNESIUM SULFATE 2 GM/50ML IV SOLN
2.0000 g | Freq: Once | INTRAVENOUS | Status: AC
  Administered 2016-10-16: 2 g via INTRAVENOUS
  Filled 2016-10-16: qty 50

## 2016-10-16 MED ORDER — FENTANYL CITRATE (PF) 100 MCG/2ML IJ SOLN: 100.0000 ug | INTRAMUSCULAR | Status: DC | PRN

## 2016-10-16 MED ORDER — SODIUM CHLORIDE 0.9 % IV BOLUS (SEPSIS)
1000.0000 mL | Freq: Once | INTRAVENOUS | Status: AC
  Administered 2016-10-16: 1000 mL via INTRAVENOUS

## 2016-10-16 MED ORDER — METRONIDAZOLE 500 MG PO TABS: 500.0000 mg | ORAL_TABLET | Freq: Three times a day (TID) | ORAL | 0 refills | Status: AC

## 2016-10-16 MED ORDER — METRONIDAZOLE 500 MG PO TABS
500.0000 mg | ORAL_TABLET | Freq: Once | ORAL | Status: AC
  Administered 2016-10-16: 500 mg via ORAL
  Filled 2016-10-16: qty 1

## 2016-10-16 MED ORDER — POTASSIUM CHLORIDE CRYS ER 20 MEQ PO TBCR
EXTENDED_RELEASE_TABLET | ORAL | Status: AC
  Administered 2016-10-16: 40 meq via ORAL
  Filled 2016-10-16: qty 2

## 2016-10-16 MED ORDER — CIPROFLOXACIN HCL 500 MG PO TABS
250.0000 mg | ORAL_TABLET | Freq: Once | ORAL | Status: AC
  Administered 2016-10-16: 250 mg via ORAL
  Filled 2016-10-16: qty 1

## 2016-10-16 MED ORDER — IOPAMIDOL (ISOVUE-300) INJECTION 61%
100.0000 mL | Freq: Once | INTRAVENOUS | Status: AC | PRN
  Administered 2016-10-16: 100 mL via INTRAVENOUS

## 2016-10-16 MED ORDER — CIPROFLOXACIN HCL 500 MG PO TABS: 500.0000 mg | ORAL_TABLET | Freq: Two times a day (BID) | ORAL | 0 refills | Status: AC

## 2016-10-16 MED ORDER — POTASSIUM CHLORIDE CRYS ER 20 MEQ PO TBCR
40.0000 meq | EXTENDED_RELEASE_TABLET | Freq: Once | ORAL | Status: AC
Start: 2016-10-16 — End: 2016-10-16
  Administered 2016-10-16: 40 meq via ORAL

## 2016-10-16 MED ORDER — POTASSIUM CHLORIDE 20 MEQ PO PACK
PACK | ORAL | Status: AC
  Administered 2016-10-16: 40 meq
  Filled 2016-10-16: qty 2

## 2016-10-16 NOTE — ED Triage Notes (Signed)
C/O diarrhea x 1 week.  Seen by PCP on Monday and diagnosed with flu.  Patient drinking gaterade and fluid just running through patient.  Denies Nausea and Vomiting.  C/O stomach being sore from cramping.

## 2016-10-16 NOTE — Discharge Instructions (Addendum)
Please seek medical attention for any high fevers, chest pain, shortness of breath, change in behavior, persistent vomiting, bloody stool or any other new or concerning symptoms.  

## 2016-10-16 NOTE — ED Notes (Signed)
Date and time results received: 10/16/16 1344   Test: Potassium Critical Value: 2.6  Name of Provider Notified: Dr Cinda Quest  Orders Received? Or Actions Taken?:Order for PO Potassium received

## 2016-10-16 NOTE — ED Notes (Signed)

## 2016-10-16 NOTE — ED Provider Notes (Signed)
[facility] Emergency Department Provider Note    First MD Initiated Contact with Patient 10/16/16 1421     (approximate)  I have reviewed the triage vital signs and the nursing notes.   HISTORY  Chief Complaint Diarrhea    HPI Sarah [name] is a 61 y.o. female presents with several days of watery diarrhea. Patient states she's recently diagnosed with the flu. No bloody diarrhea. States that she's been diagnosed with colitis in the past. No nausea or vomiting.  States she has mild right lower quadrant pain.  Described as achy in nature.   Past Medical History:  Diagnosis Date  . Anemia   . Anxiety   . Arthritis   . Bicuspid aortic valve   . CHF (congestive heart failure) ([place])   . CKD (chronic kidney disease) stage 3, GFR 30-59 ml/min   . Depression   . Diabetes mellitus ([place])   . Dizziness   . Fatty liver   . Frequent falls   . GERD (gastroesophageal reflux disease)   . Gout   . Heart murmur   . History of blood transfusion   . History of bone marrow transplant ([place])   . History of uterine fibroid   . Hypertension   . Hypomagnesemia   . Multiple myeloma ([place])   . Renal cyst    Family History  Problem Relation Age of Onset  . Colon cancer Father   . Renal Disease Father   . Diabetes Mellitus II Father   . Melanoma Paternal Grandmother   . Breast cancer Maternal Aunt   . Anemia Mother   . Heart disease Mother   . Heart failure Mother   . Renal Disease Mother   . Congestive Heart Failure Mother   . Heart disease Maternal Uncle   . Throat cancer Maternal Uncle   . Lung cancer Maternal Uncle   . Liver disease Maternal Uncle   . Heart failure Maternal Uncle   . Hearing loss Son 4    Suicide    Past Surgical History:  Procedure Laterality Date  . 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 CARDIAC ELECTROPHYSIOLOGY MAPPING AND ABLATION    . CARPAL TUNNEL RELEASE Bilateral   . CHOLECYSTECTOMY  2008  . FOOT SURGERY Bilateral   .  INCONTINENCE SURGERY  2009  . PARTIAL HYSTERECTOMY  03/1996   fibroids  . TONSILLECTOMY  2007   Patient Active Problem List   Diagnosis Date Noted  . Sinus congestion 05/30/2016  . Environmental and seasonal allergies 04/19/2016  . Insomnia 04/19/2016  . Bicuspid aortic valve 04/19/2016  . Asthma 04/19/2016  . Aortic regurgitation 04/19/2016  . History of CHF (congestive heart failure) 04/19/2016  . DM (diabetes mellitus), type 2 with renal complications ([place]) 40/34/7425  . Anemia 04/12/2016  . GERD (gastroesophageal reflux disease) 04/12/2016  . Depression with anxiety 04/12/2016  . Irritable bowel syndrome (IBS) 04/12/2016  . Hepatic steatosis 04/12/2016  . Multiple myeloma in remission ([place]) 04/02/2016  . Abnormal stress test 02/14/2016  . 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[place]) 07/06/2015  . Disequilibrium 01/14/2013      Prior to Admission medications   Medication Sig Start Date End Date Taking? Authorizing Provider  acyclovir (ZOVIRAX) 200 MG capsule Take 2 capsules (400 mg total) by mouth 3 (three) times daily. 05/30/16  Yes Glean Hess, MD  bisoprolol (ZEBETA) 10 MG tablet Take 1 tablet (10 mg total) by mouth daily. 05/30/16  Yes Glean Hess, MD  Calcium Carbonate-Vit D-Min (CALCIUM 600+D PLUS MINERALS) 600-400 MG-UNIT TABS Take 1 tablet by mouth daily.    Yes Historical Provider, MD  cholestyramine (QUESTRAN) 4 g packet Take 4 g once daily. 06/18/16  Yes Cammie Sickle, MD  diphenoxylate-atropine (LOMOTIL) 2.5-0.025 MG tablet Take 2 tablets by mouth daily as needed for diarrhea or loose stools. 09/24/16  Yes Cammie Sickle, MD  DULoxetine (CYMBALTA) 60 MG capsule TAKE 1 CAPSULE(60 MG) BY MOUTH TWICE DAILY 06/17/16  Yes Glean Hess, MD  fentaNYL (DURAGESIC - DOSED MCG/HR) 25 MCG/HR patch Place 25 mcg onto the skin every 3 (three) days. 08/12/16  Yes Historical Provider, MD  fexofenadine (ALLEGRA) 180 MG tablet Take 1 tablet (180 mg total)  by mouth daily. 05/30/16  Yes Glean Hess, MD  fluticasone (FLONASE) 50 MCG/ACT nasal spray Place 2 sprays into both nostrils daily. 05/30/16  Yes Glean Hess, MD  furosemide (LASIX) 20 MG tablet Take 0.5 tablets (10 mg total) by mouth daily as needed. 05/30/16  Yes Glean Hess, MD  hydrOXYzine (ATARAX/VISTARIL) 10 MG tablet Take 1 tablet (10 mg total) by mouth 3 (three) times daily as needed. 05/30/16  Yes Glean Hess, MD  montelukast (SINGULAIR) 10 MG tablet Take 1 tablet (10 mg total) by mouth at bedtime. 05/30/16  Yes Glean Hess, MD  Multiple Vitamins-Minerals (HAIR/SKIN/NAILS/BIOTIN PO) Take 1 capsule by mouth 3 (three) times daily.   Yes Historical Provider, MD  omeprazole (PRILOSEC) 40 MG capsule Take 1 capsule (40 mg total) by mouth daily. 05/30/16  Yes Glean Hess, MD  promethazine (PHENERGAN) 25 MG tablet Take 25 mg by mouth as needed.  02/09/16  Yes Historical Provider, MD  REVLIMID 5 MG capsule Take 1 capsule (5 mg total) by mouth daily. X 21 days.  1 week off 06/25/16  Yes Sindy Guadeloupe, MD  tiZANidine (ZANAFLEX) 4 MG tablet Take 1 tablet (4 mg total) by mouth every 8 (eight) hours as needed. Muscle relaxer for back pain 05/30/16  Yes Glean Hess, MD  zaleplon (SONATA) 10 MG capsule Take 1 capsule (10 mg total) by mouth at bedtime as needed for sleep. 09/02/16  Yes Glean Hess, MD  Zoledronic Acid (ZOMETA IV) Inject into the vein every 30 (thirty) days.   Yes Historical Provider, MD  allopurinol (ZYLOPRIM) 100 MG tablet Take 1 tablet (100 mg total) by mouth daily. 05/30/16   Glean Hess, MD  ciprofloxacin (CIPRO) 500 MG tablet Take 1 tablet (500 mg total) by mouth 2 (two) times daily. 10/16/16 10/26/16  Merlyn Lot, MD  metroNIDAZOLE (FLAGYL) 500 MG tablet Take 1 tablet (500 mg total) by mouth 3 (three) times daily. 10/16/16 10/23/16  Merlyn Lot, MD    Allergies Oxycodone-acetaminophen; Benadryl [diphenhydramine]; Morphine; Ondansetron;  and Tylenol [acetaminophen]    Social History Social History  Substance Use Topics  . Smoking status: Former Smoker    Packs/day: 1.00    Years: 20.00    Types: Cigarettes    Quit date: 07/02/1991  . Smokeless tobacco: Never Used  . Alcohol use 1.2 oz/week    1 Glasses of wine, 1 Cans of beer per week     Comment: occassional    Review of Systems Patient denies headaches, rhinorrhea, blurry vision, numbness, shortness of breath, chest pain, edema, cough, abdominal pain, nausea, vomiting, diarrhea, dysuria, fevers, rashes or hallucinations unless otherwise stated above in HPI. ____________________________________________   PHYSICAL EXAM:  VITAL SIGNS: Vitals:   10/16/16 1637  10/16/16 1700  BP:  117/86  Pulse: 83 80  Resp: 11 12  Temp:      Constitutional: Alert and oriented. Well appearing and in no acute distress. Eyes: Conjunctivae are normal. PERRL. EOMI. Head: Atraumatic. Nose: No congestion/rhinnorhea. Mouth/Throat: Mucous membranes are moist.  Oropharynx non-erythematous. Neck: No stridor. Painless ROM. No cervical spine tenderness to palpation Hematological/Lymphatic/Immunilogical: No cervical lymphadenopathy. Cardiovascular: Normal rate, regular rhythm. Grossly normal heart sounds.  Good peripheral circulation. Respiratory: Normal respiratory effort.  No retractions. Lungs CTAB. Gastrointestinal: Soft , RLQ ttp.  No distention. No abdominal bruits. No CVA tenderness. Genitourinary:  Musculoskeletal: No lower extremity tenderness nor edema.  No joint effusions. Neurologic:  Normal speech and language. No gross focal neurologic deficits are appreciated. No gait instability. Skin:  Skin is warm, dry and intact. No rash noted. Psychiatric: Mood and affect are normal. Speech and behavior are normal.  ____________________________________________   LABS (all labs ordered are listed, but only abnormal results are displayed)  Results for orders placed or performed  during the hospital encounter of 10/16/16 (from the past 24 hour(s))  Lipase, blood     Status: None   Collection Time: 10/16/16  1:28 PM  Result Value Ref Range   Lipase 23 11 - 51 U/L  Comprehensive metabolic panel     Status: Abnormal   Collection Time: 10/16/16  1:28 PM  Result Value Ref Range   Sodium 140 135 - 145 mmol/L   Potassium 2.6 (LL) 3.5 - 5.1 mmol/L   Chloride 108 101 - 111 mmol/L   CO2 24 22 - 32 mmol/L   Glucose, Bld 99 65 - 99 mg/dL   BUN 14 6 - 20 mg/dL   Creatinine, Ser 0.96 0.44 - 1.00 mg/dL   Calcium 8.8 (L) 8.9 - 10.3 mg/dL   Total Protein 7.2 6.5 - 8.1 g/dL   Albumin 4.4 3.5 - 5.0 g/dL   AST 25 15 - 41 U/L   ALT 26 14 - 54 U/L   Alkaline Phosphatase 77 38 - 126 U/L   Total Bilirubin 0.5 0.3 - 1.2 mg/dL   GFR calc non Af Amer >60 >60 mL/min   GFR calc Af Amer >60 >60 mL/min   Anion gap 8 5 - 15  CBC     Status: Abnormal   Collection Time: 10/16/16  1:28 PM  Result Value Ref Range   WBC 3.0 (L) 3.6 - 11.0 K/uL   RBC 3.62 (L) 3.80 - 5.20 MIL/uL   Hemoglobin 11.1 (L) 12.0 - 16.0 g/dL   HCT 32.1 (L) 35.0 - 47.0 %   MCV 88.6 80.0 - 100.0 fL   MCH 30.8 26.0 - 34.0 pg   MCHC 34.7 32.0 - 36.0 g/dL   RDW 14.4 11.5 - 14.5 %   Platelets 116 (L) 150 - 440 K/uL  Urinalysis, Complete w Microscopic     Status: Abnormal   Collection Time: 10/16/16  1:28 PM  Result Value Ref Range   Color, Urine YELLOW (A) YELLOW   APPearance CLEAR (A) CLEAR   Specific Gravity, Urine 1.012 1.005 - 1.030   pH 6.0 5.0 - 8.0   Glucose, UA NEGATIVE NEGATIVE mg/dL   Hgb urine dipstick NEGATIVE NEGATIVE   Bilirubin Urine NEGATIVE NEGATIVE   Ketones, ur NEGATIVE NEGATIVE mg/dL   Protein, ur NEGATIVE NEGATIVE mg/dL   Nitrite NEGATIVE NEGATIVE   Leukocytes, UA SMALL (A) NEGATIVE   RBC / HPF 0-5 0 - 5 RBC/hpf   WBC, UA 0-5 0 -  5 WBC/hpf   Bacteria, UA NONE SEEN NONE SEEN   Squamous Epithelial / LPF 0-5 (A) NONE SEEN   Mucous PRESENT    Non Squamous Epithelial 0-5 (A) NONE SEEN  C  difficile quick scan w PCR reflex     Status: None   Collection Time: 10/16/16  4:19 PM  Result Value Ref Range   C Diff antigen NEGATIVE NEGATIVE   C Diff toxin NEGATIVE NEGATIVE   C Diff interpretation No C. difficile detected.    ____________________________________________  EKG____________________________________________  RADIOLOGY  I personally reviewed all radiographic images ordered to evaluate for the above acute complaints and reviewed radiology reports and findings.  These findings were personally discussed with the patient.  Please see medical record for radiology report.  ____________________________________________   PROCEDURES  Procedure(s) performed:  Procedures    Critical Care performed: no ____________________________________________   INITIAL IMPRESSION / ASSESSMENT AND PLAN / ED COURSE  Pertinent labs & imaging results that were available during my care of the patient were reviewed by me and considered in my medical decision making (see chart for details).  DDX: colitis, abscess, crohns, dehydration  Sarah [name] is a 61 y.o. who presents to the ED with colitis history presents with watery diarrhea. Patient well-appearing and in no acute distress. Blood work is also reassuring. CT imaging ordered to evaluate for acute abnormality shows evidence of probable infectious colitis. We will replete potassium and magnesium.  Will start on flagyl and cipro.  Patient will be signed out the Dr. Archie Balboa pending PO challenge.  If able to tolerate Oral abx, do feel patient stable for DC home with close outpatient follow up for repeat BMP.  Have discussed with the patient and available family all diagnostics and treatments performed thus far and all questions were answered to the best of my ability. The patient demonstrates understanding and agreement with plan.       ____________________________________________   FINAL CLINICAL IMPRESSION(S) / ED  DIAGNOSES  Final diagnoses:  Diarrhea of presumed infectious origin  Colitis      NEW MEDICATIONS STARTED DURING THIS VISIT:  Discharge Medication List as of 10/16/2016  4:40 PM    START taking these medications   Details  ciprofloxacin (CIPRO) 500 MG tablet Take 1 tablet (500 mg total) by mouth 2 (two) times daily., Starting Wed 10/16/2016, Until Sat 10/26/2016, Print    metroNIDAZOLE (FLAGYL) 500 MG tablet Take 1 tablet (500 mg total) by mouth 3 (three) times daily., Starting Wed 10/16/2016, Until Wed 10/23/2016, Print         Note:  This document was prepared using Dragon voice recognition software and may include unintentional dictation errors.    Merlyn Lot, MD 10/16/16 [phone]

## 2016-10-18 ENCOUNTER — Other Ambulatory Visit: Payer: Self-pay | Admitting: Internal Medicine

## 2016-10-22 ENCOUNTER — Inpatient Hospital Stay: Payer: Managed Care, Other (non HMO)

## 2016-10-22 VITALS — BP 97/61 | HR 65 | Temp 96.8°F | Resp 20

## 2016-10-22 DIAGNOSIS — C9001 Multiple myeloma in remission: Secondary | ICD-10-CM

## 2016-10-22 DIAGNOSIS — C9000 Multiple myeloma not having achieved remission: Secondary | ICD-10-CM | POA: Diagnosis present

## 2016-10-22 DIAGNOSIS — Z79899 Other long term (current) drug therapy: Secondary | ICD-10-CM | POA: Diagnosis not present

## 2016-10-22 LAB — BASIC METABOLIC PANEL
Anion gap: 6 (ref 5–15)
BUN: 23 mg/dL — AB (ref 6–20)
CHLORIDE: 106 mmol/L (ref 101–111)
CO2: 24 mmol/L (ref 22–32)
CREATININE: 1.23 mg/dL — AB (ref 0.44–1.00)
Calcium: 8.7 mg/dL — ABNORMAL LOW (ref 8.9–10.3)
GFR calc Af Amer: 54 mL/min — ABNORMAL LOW (ref >60)
GFR, EST NON AFRICAN AMERICAN: 47 mL/min — AB (ref >60)
GLUCOSE: 127 mg/dL — AB (ref 65–99)
POTASSIUM: 3.7 mmol/L (ref 3.5–5.1)
SODIUM: 136 mmol/L (ref 135–145)

## 2016-10-22 LAB — MAGNESIUM: Magnesium: 1 mg/dL — ABNORMAL LOW (ref 1.7–2.4)

## 2016-10-22 LAB — CBC WITH DIFFERENTIAL/PLATELET
Basophils Absolute: 0.1 10*3/uL (ref 0–0.1)
Basophils Relative: 1 %
Eosinophils Absolute: 0.1 10*3/uL (ref 0–0.7)
Eosinophils Relative: 2 %
HEMATOCRIT: 30.5 % — AB (ref 35.0–47.0)
HEMOGLOBIN: 10.6 g/dL — AB (ref 12.0–16.0)
LYMPHS ABS: 0.7 10*3/uL — AB (ref 1.0–3.6)
LYMPHS PCT: 19 %
MCH: 31.3 pg (ref 26.0–34.0)
MCHC: 34.8 g/dL (ref 32.0–36.0)
MCV: 90 fL (ref 80.0–100.0)
MONO ABS: 0.5 10*3/uL (ref 0.2–0.9)
MONOS PCT: 12 %
NEUTROS ABS: 2.4 10*3/uL (ref 1.4–6.5)
Neutrophils Relative %: 66 %
Platelets: 113 10*3/uL — ABNORMAL LOW (ref 150–440)
RBC: 3.39 MIL/uL — ABNORMAL LOW (ref 3.80–5.20)
RDW: 14.4 % (ref 11.5–14.5)
WBC: 3.7 10*3/uL (ref 3.6–11.0)

## 2016-10-22 MED ORDER — MAGNESIUM SULFATE 4 GM/100ML IV SOLN
4.0000 g | Freq: Once | INTRAVENOUS | Status: AC
  Administered 2016-10-22: 4 g via INTRAVENOUS
  Filled 2016-10-22: qty 100

## 2016-10-22 MED ORDER — ZOLEDRONIC ACID 4 MG/100ML IV SOLN
4.0000 mg | Freq: Once | INTRAVENOUS | Status: AC
  Administered 2016-10-22: 4 mg via INTRAVENOUS
  Filled 2016-10-22: qty 100

## 2016-10-22 MED ORDER — SODIUM CHLORIDE 0.9 % IV SOLN
Freq: Once | INTRAVENOUS | Status: AC
  Administered 2016-10-22: 09:00:00 via INTRAVENOUS
  Filled 2016-10-22: qty 1000

## 2016-10-31 NOTE — Progress Notes (Signed)
Nutrition Follow-up:  Patient returned phone call this am.  Nutrition follow-up complete via phone.    Patient reports appetite has been decreased due to episode of the flu and infectious colitis. Noted ED visit on 4/18.  Patient reports that she finished antibiotic yesterday and has eaten more today than she has in the last few weeks.  Reports diarrhea has improved.  Reports has eaten cereal for breakfast and 1/2 chicken salad sandwich so far today and no issues with diarrhea or nausea.  Has not tried protein shake as has been staying away from diary with diarrhea.    Medications: reviewed  Labs: reviewed  Anthropometrics:   Patient reports weight of 123 pounds today, decreased from 128 lb when seen on 4/5.     NUTRITION DIAGNOSIS: Inadequate oral intake continues   MALNUTRITION DIAGNOSIS: Severe malnutrition continues   INTERVENTION:   Discussed nutrition strategies to help with diarrhea.  Patient verbalized understanding. Encouraged small frequent meals. Encouraged adding oral nutrition supplements back into diet to provide additional calories and protein.    MONITORING, EVALUATION, GOAL: Patient will consume adequate calories and protein to maintain nutrition   NEXT VISIT:  Phone call follow-up  as patient seen in [place] [facility]  [place]. Zenia Resides, [place], [place] Registered Dietitian [phone] (pager)

## 2016-10-31 NOTE — Progress Notes (Signed)
Nutrition:  Called patient this am for nutrition follow-up via phone.  Patient typically seen at [facility] clinic.  Left voicemail message. Contact information provided for patient. Will follow-up as able.  Lachlan Mckim B. Zenia Resides, [place], [place] Registered Dietitian [phone] (pager)

## 2016-11-05 ENCOUNTER — Encounter: Payer: Self-pay | Admitting: Internal Medicine

## 2016-11-08 ENCOUNTER — Encounter: Payer: Self-pay | Admitting: Internal Medicine

## 2016-11-15 ENCOUNTER — Other Ambulatory Visit: Payer: Self-pay | Admitting: *Deleted

## 2016-11-15 DIAGNOSIS — C9001 Multiple myeloma in remission: Secondary | ICD-10-CM

## 2016-11-15 MED ORDER — REVLIMID 5 MG PO CAPS: 5.0000 mg | ORAL_CAPSULE | Freq: Every day | ORAL | 0 refills | Status: DC

## 2016-11-18 ENCOUNTER — Ambulatory Visit (INDEPENDENT_AMBULATORY_CARE_PROVIDER_SITE_OTHER): Payer: Managed Care, Other (non HMO) | Admitting: Physician Assistant

## 2016-11-18 VITALS — BP 122/68 | HR 67 | Temp 98.4°F | Ht 63.0 in | Wt 126.6 lb

## 2016-11-18 DIAGNOSIS — C9001 Multiple myeloma in remission: Secondary | ICD-10-CM

## 2016-11-18 DIAGNOSIS — L239 Allergic contact dermatitis, unspecified cause: Secondary | ICD-10-CM | POA: Diagnosis not present

## 2016-11-18 DIAGNOSIS — N182 Chronic kidney disease, stage 2 (mild): Secondary | ICD-10-CM

## 2016-11-18 DIAGNOSIS — E1122 Type 2 diabetes mellitus with diabetic chronic kidney disease: Secondary | ICD-10-CM

## 2016-11-18 DIAGNOSIS — Z9484 Stem cells transplant status: Secondary | ICD-10-CM | POA: Diagnosis not present

## 2016-11-18 MED ORDER — PREDNISONE 10 MG (21) PO TBPK: ORAL_TABLET | ORAL | 0 refills | Status: DC

## 2016-11-18 NOTE — Progress Notes (Signed)
Subjective:    Patient ID: Sarah [name], female    DOB: 1956/04/20, 61 y.o.   MRN: [id number]  Sarah [name] is a 61 y.o. female presenting on 11/18/2016 for Rash (Started 5 days ago, on Rt arm. Spread all over body. Was outside in back yard but has not been outside since rash started. Started taking ceflex for a scratch 5 days ago. Stopped because rash became worse. Itching all over- Starting to burn.  )   HPI   Sarah [name] is a 61 y/o female with history of Type II DM, multiple myeloma s/p stem cell transplant presenting today for a rash. It started five days ago with vesicular lesions in a line on her arm. It has since spread to both arms, legs, trunk, neck and face. Of note, she saw her neurosurgeon Dr. Josephina [name] at [name] for cellulitis on 11/13/2016 and was placed on 500 mg Keflex BID 2/2 her kidney function. Her rash was present prior to taking Keflex. She took 3-4 days of Keflex and then stopped because she thought it might be related to the Keflex. She was outside around five days ago. She is having persistent itching. No weeping or bleeding from lesions. She has taken steroids before and tolerated them. Her cellulitis has improved on her left leg.   Social History  Substance Use Topics  . Smoking status: Former Smoker    Packs/day: 1.00    Years: 20.00    Types: Cigarettes    Quit date: 07/02/1991  . Smokeless tobacco: Never Used  . Alcohol use 1.2 oz/week    1 Glasses of wine, 1 Cans of beer per week     Comment: occassional    Review of Systems Per HPI unless specifically indicated above     Objective:    BP 122/68 (BP Location: Right Arm, Patient Position: Sitting, Cuff Size: Normal)   Pulse 67   Ht 5' 3"  (1.6 m)   Wt 126 lb 9.6 oz (57.4 kg)   SpO2 100%   BMI 22.43 kg/m   Wt Readings from Last 3 Encounters:  11/18/16 126 lb 9.6 oz (57.4 kg)  10/16/16 124 lb (56.2 kg)  10/14/16 124 lb 9.6 oz (56.5 kg)    Physical Exam  Constitutional: She is oriented  to person, place, and time. She appears well-developed and well-nourished. No distress.  Neck: Neck supple.  Cardiovascular: Normal rate.   Pulmonary/Chest: Effort normal.  Neurological: She is alert and oriented to person, place, and time.  Skin: Skin is warm and dry. Rash noted. There is erythema.  There are three linear depositions of vesicles on right forearm arms and more vesicles distributed on other regions of arms, trunk, legs, and face/neck. She also has a linear healing laceration of her left anterior shin with mild surrounding erythema but no warmth, pus, or bleeding.   Psychiatric: She has a normal mood and affect. Her behavior is normal.  Vitals reviewed.  Results for orders placed or performed in visit on 38/10/17  Basic metabolic panel  Result Value Ref Range   Sodium 136 135 - 145 mmol/L   Potassium 3.7 3.5 - 5.1 mmol/L   Chloride 106 101 - 111 mmol/L   CO2 24 22 - 32 mmol/L   Glucose, Bld 127 (H) 65 - 99 mg/dL   BUN 23 (H) 6 - 20 mg/dL   Creatinine, Ser 1.23 (H) 0.44 - 1.00 mg/dL   Calcium 8.7 (L) 8.9 - 10.3 mg/dL   GFR calc non Af  Amer 47 (L) >60 mL/min   GFR calc Af Amer 54 (L) >60 mL/min   Anion gap 6 5 - 15  CBC with Differential  Result Value Ref Range   WBC 3.7 3.6 - 11.0 K/uL   RBC 3.39 (L) 3.80 - 5.20 MIL/uL   Hemoglobin 10.6 (L) 12.0 - 16.0 g/dL   HCT 30.5 (L) 35.0 - 47.0 %   MCV 90.0 80.0 - 100.0 fL   MCH 31.3 26.0 - 34.0 pg   MCHC 34.8 32.0 - 36.0 g/dL   RDW 14.4 11.5 - 14.5 %   Platelets 113 (L) 150 - 440 K/uL   Neutrophils Relative % 66 %   Neutro Abs 2.4 1.4 - 6.5 K/uL   Lymphocytes Relative 19 %   Lymphs Abs 0.7 (L) 1.0 - 3.6 K/uL   Monocytes Relative 12 %   Monocytes Absolute 0.5 0.2 - 0.9 K/uL   Eosinophils Relative 2 %   Eosinophils Absolute 0.1 0 - 0.7 K/uL   Basophils Relative 1 %   Basophils Absolute 0.1 0 - 0.1 K/uL  Magnesium  Result Value Ref Range   Magnesium 1.0 (L) 1.7 - 2.4 mg/dL      Assessment & Plan:   1. Allergic  contact dermatitis, unspecified trigger  Severe itching with facial involvement. Steroid taper as below. Cautioned about side effects of increased blood sugar. Last A1C in October 2017 was 5.1. She has stopped taking Keflex, but her left leg cellulitis appears to be healing. She is to call preferably her neurosurgeon but also here if she cannot get anyone if she notices increased redness, swelling, warmth, pus.   - predniSONE (STERAPRED UNI-PAK 21 TAB) 10 MG (21) TBPK tablet; Take 6 pills on day one, then 5, 4, 3, 2, 1, on each of the following days.  Dispense: 21 tablet; Refill: 0  2. Type 2 diabetes mellitus with stage 2 chronic kidney disease, without long-term current use of insulin ([name])  Well controlled, last A1C is 5.1 in October 2017.  3. Multiple myeloma in remission ([name])  Treated for this in [place] prior to moving here.   4. 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[name])  See #3.   Return if symptoms worsen or fail to improve.    Carles Collet, PA-C [facility] 11/18/2016, 1:47 PM

## 2016-11-18 NOTE — Patient Instructions (Signed)
Poison Ivy Dermatitis Poison ivy dermatitis is redness and soreness (inflammation) of the skin. It is caused by a chemical that is found on the leaves of the poison ivy plant. You may also have itching, a rash, and blisters. Symptoms often clear up in 1-2 weeks. You may get this condition by touching a poison ivy plant. You can also get it by touching something that has the chemical on it. This may include animals or objects that have come in contact with the plant. Follow these instructions at home: General instructions   Take or apply over-the-counter and prescription medicines only as told by your doctor.  If you touch poison ivy, wash your skin with soap and cold water right away.  Use hydrocortisone creams or calamine lotion as needed to help with itching.  Take oatmeal baths as needed. Use colloidal oatmeal. You can get this at a pharmacy or grocery store. Follow the instructions on the package.  Do not scratch or rub your skin.  While you have the rash, wash your clothes right after you wear them. Prevention   Know what poison ivy looks like so you can avoid it. This plant has three leaves with flowering branches on a single stem. The leaves are glossy. They have uneven edges that come to a point at the front.  If you have touched poison ivy, wash with soap and water right away. Be sure to wash under your fingernails.  When hiking or camping, wear long pants, a long-sleeved shirt, tall socks, and hiking boots. You can also use a lotion on your skin that helps to prevent contact with the chemical on the plant.  If you think that your clothes or outdoor gear came in contact with poison ivy, rinse them off with a garden hose before you bring them inside your house. Contact a doctor if:  You have open sores in the rash area.  You have more redness, swelling, or pain in the affected area.  You have redness that spreads beyond the rash area.  You have fluid, blood, or pus coming  from the affected area.  You have a fever.  You have a rash over a large area of your body.  You have a rash on your eyes, mouth, or genitals.  Your rash does not get better after a few days. Get help right away if:  Your face swells or your eyes swell shut.  You have trouble breathing.  You have trouble swallowing. This information is not intended to replace advice given to you by your health care provider. Make sure you discuss any questions you have with your health care provider. Document Released: 07/20/2010 Document Revised: 11/23/2015 Document Reviewed: 11/23/2014 Elsevier Interactive Patient Education  2017 [employer].

## 2016-11-19 ENCOUNTER — Inpatient Hospital Stay: Payer: Managed Care, Other (non HMO)

## 2016-12-11 ENCOUNTER — Other Ambulatory Visit: Payer: Self-pay | Admitting: Internal Medicine

## 2016-12-19 ENCOUNTER — Other Ambulatory Visit: Payer: Self-pay | Admitting: *Deleted

## 2016-12-19 DIAGNOSIS — C9001 Multiple myeloma in remission: Secondary | ICD-10-CM

## 2016-12-19 MED ORDER — REVLIMID 5 MG PO CAPS: 5.0000 mg | ORAL_CAPSULE | Freq: Every day | ORAL | 0 refills | Status: DC

## 2016-12-24 ENCOUNTER — Inpatient Hospital Stay: Payer: Managed Care, Other (non HMO)

## 2016-12-24 ENCOUNTER — Telehealth: Payer: Self-pay | Admitting: Internal Medicine

## 2016-12-24 ENCOUNTER — Inpatient Hospital Stay: Payer: Managed Care, Other (non HMO) | Attending: Internal Medicine | Admitting: Internal Medicine

## 2016-12-24 ENCOUNTER — Encounter: Payer: Self-pay | Admitting: Internal Medicine

## 2016-12-24 VITALS — BP 115/72 | HR 76 | Resp 20

## 2016-12-24 VITALS — BP 111/66 | HR 81 | Temp 97.7°F | Ht 63.0 in | Wt 126.5 lb

## 2016-12-24 DIAGNOSIS — C9001 Multiple myeloma in remission: Secondary | ICD-10-CM

## 2016-12-24 DIAGNOSIS — Z9484 Stem cells transplant status: Secondary | ICD-10-CM

## 2016-12-24 DIAGNOSIS — Z79899 Other long term (current) drug therapy: Secondary | ICD-10-CM | POA: Insufficient documentation

## 2016-12-24 DIAGNOSIS — R634 Abnormal weight loss: Secondary | ICD-10-CM | POA: Diagnosis not present

## 2016-12-24 DIAGNOSIS — G8929 Other chronic pain: Secondary | ICD-10-CM | POA: Diagnosis not present

## 2016-12-24 DIAGNOSIS — Z87891 Personal history of nicotine dependence: Secondary | ICD-10-CM | POA: Diagnosis not present

## 2016-12-24 DIAGNOSIS — Q231 Congenital insufficiency of aortic valve: Secondary | ICD-10-CM | POA: Insufficient documentation

## 2016-12-24 DIAGNOSIS — R5383 Other fatigue: Secondary | ICD-10-CM | POA: Diagnosis not present

## 2016-12-24 DIAGNOSIS — K219 Gastro-esophageal reflux disease without esophagitis: Secondary | ICD-10-CM | POA: Diagnosis not present

## 2016-12-24 DIAGNOSIS — K5900 Constipation, unspecified: Secondary | ICD-10-CM | POA: Insufficient documentation

## 2016-12-24 DIAGNOSIS — F329 Major depressive disorder, single episode, unspecified: Secondary | ICD-10-CM

## 2016-12-24 DIAGNOSIS — I130 Hypertensive heart and chronic kidney disease with heart failure and stage 1 through stage 4 chronic kidney disease, or unspecified chronic kidney disease: Secondary | ICD-10-CM | POA: Insufficient documentation

## 2016-12-24 DIAGNOSIS — R197 Diarrhea, unspecified: Secondary | ICD-10-CM | POA: Diagnosis not present

## 2016-12-24 DIAGNOSIS — M549 Dorsalgia, unspecified: Secondary | ICD-10-CM | POA: Insufficient documentation

## 2016-12-24 DIAGNOSIS — F419 Anxiety disorder, unspecified: Secondary | ICD-10-CM

## 2016-12-24 DIAGNOSIS — E1122 Type 2 diabetes mellitus with diabetic chronic kidney disease: Secondary | ICD-10-CM

## 2016-12-24 DIAGNOSIS — K529 Noninfective gastroenteritis and colitis, unspecified: Secondary | ICD-10-CM | POA: Diagnosis not present

## 2016-12-24 DIAGNOSIS — Z803 Family history of malignant neoplasm of breast: Secondary | ICD-10-CM

## 2016-12-24 DIAGNOSIS — M109 Gout, unspecified: Secondary | ICD-10-CM | POA: Diagnosis not present

## 2016-12-24 DIAGNOSIS — N183 Chronic kidney disease, stage 3 (moderate): Secondary | ICD-10-CM | POA: Insufficient documentation

## 2016-12-24 DIAGNOSIS — Z9481 Bone marrow transplant status: Secondary | ICD-10-CM | POA: Diagnosis not present

## 2016-12-24 LAB — CBC WITH DIFFERENTIAL/PLATELET
BASOS ABS: 0 10*3/uL (ref 0–0.1)
Basophils Relative: 1 %
EOS PCT: 3 %
Eosinophils Absolute: 0.1 10*3/uL (ref 0–0.7)
HEMATOCRIT: 26 % — AB (ref 35.0–47.0)
Hemoglobin: 9 g/dL — ABNORMAL LOW (ref 12.0–16.0)
LYMPHS ABS: 0.8 10*3/uL — AB (ref 1.0–3.6)
LYMPHS PCT: 24 %
MCH: 31.5 pg (ref 26.0–34.0)
MCHC: 34.5 g/dL (ref 32.0–36.0)
MCV: 91.1 fL (ref 80.0–100.0)
MONO ABS: 0.4 10*3/uL (ref 0.2–0.9)
Monocytes Relative: 13 %
NEUTROS ABS: 2.1 10*3/uL (ref 1.4–6.5)
Neutrophils Relative %: 59 %
Platelets: 114 10*3/uL — ABNORMAL LOW (ref 150–440)
RBC: 2.85 MIL/uL — AB (ref 3.80–5.20)
RDW: 16.1 % — AB (ref 11.5–14.5)
WBC: 3.5 10*3/uL — ABNORMAL LOW (ref 3.6–11.0)

## 2016-12-24 LAB — COMPREHENSIVE METABOLIC PANEL
ALBUMIN: 4.1 g/dL (ref 3.5–5.0)
ALT: 19 U/L (ref 14–54)
AST: 26 U/L (ref 15–41)
Alkaline Phosphatase: 94 U/L (ref 38–126)
Anion gap: 11 (ref 5–15)
BUN: 22 mg/dL — AB (ref 6–20)
CHLORIDE: 107 mmol/L (ref 101–111)
CO2: 21 mmol/L — ABNORMAL LOW (ref 22–32)
Calcium: 7 mg/dL — ABNORMAL LOW (ref 8.9–10.3)
Creatinine, Ser: 1.16 mg/dL — ABNORMAL HIGH (ref 0.44–1.00)
GFR calc Af Amer: 58 mL/min — ABNORMAL LOW (ref >60)
GFR, EST NON AFRICAN AMERICAN: 50 mL/min — AB (ref >60)
GLUCOSE: 124 mg/dL — AB (ref 65–99)
POTASSIUM: 3.7 mmol/L (ref 3.5–5.1)
Sodium: 139 mmol/L (ref 135–145)
Total Bilirubin: 0.5 mg/dL (ref 0.3–1.2)
Total Protein: 6.8 g/dL (ref 6.5–8.1)

## 2016-12-24 LAB — MAGNESIUM: MAGNESIUM: 0.6 mg/dL — AB (ref 1.7–2.4)

## 2016-12-24 MED ORDER — MAGNESIUM SULFATE 4 GM/100ML IV SOLN
4.0000 g | Freq: Once | INTRAVENOUS | Status: AC
  Administered 2016-12-24: 4 g via INTRAVENOUS
  Filled 2016-12-24: qty 100

## 2016-12-24 MED ORDER — PROMETHAZINE HCL 25 MG PO TABS: ORAL_TABLET | ORAL | 4 refills | Status: DC

## 2016-12-24 MED ORDER — SODIUM CHLORIDE 0.9 % IV SOLN
Freq: Once | INTRAVENOUS | Status: AC
  Administered 2016-12-24: 11:00:00 via INTRAVENOUS
  Filled 2016-12-24: qty 1000

## 2016-12-24 NOTE — Assessment & Plan Note (Addendum)
Multiple myeloma IgA lambda as per the records status post autologous stem cell transplant in December 2016 currently on Revlimid 62m 3 w On & 1 week OFF.   # Clinically no evidence of progression at this time. MARCH 2018- M- protein- NEGATIVE/ K-L=Normal. Skeletal survey- Normal. Myeloma labs from today pending.   # Right lower jaw- bone exposed- ? ONJ- HOLD ZOMETA; referral to Dental surgery; ? Dr.Parks.   # # Nausea chronic on Phenergan 25 mg; new script given.   # Chronic diarrhea alternating with constipation-  question Revlimid; On cholestyramine; along with lomotil. Improved  # weight loss-stable.  # Chronic mild-mod thrombocytopenia 120/ ? Revlimid vs others. Monitor for now.   #  Hypomagnesemia- 0.8 g. Recommend IV mag today; again in 6 months.   # Chronic back pain- pain management follow-up; on fentanyl  25 mc/ [Dr.James; [facility]; [place]]; no BTP. stable  # HOLD ZOMETA; continue every 4 weeks/ BMP and magnesium; IV supp Mag. Follow up with me in 4 weeks.

## 2016-12-24 NOTE — Progress Notes (Signed)
Patient here for follow up. Patient states she experiencing increased nausea for the past 8 weeks.

## 2016-12-24 NOTE — Patient Instructions (Signed)
Magnesium Sulfate injection What is this medicine? MAGNESIUM SULFATE (mag NEE zee um SUL fate) is an electrolyte injection commonly used to treat low magnesium levels in your blood. It is also used to prevent or control seizures in women with preeclampsia or eclampsia. This medicine may be used for other purposes; ask your health care provider or pharmacist if you have questions. What should I tell my health care provider before I take this medicine? They need to know if you have any of these conditions: -heart disease -history of irregular heart beat -kidney disease -an unusual or allergic reaction to magnesium sulfate, medicines, foods, dyes, or preservatives -pregnant or trying to get pregnant -breast-feeding How should I use this medicine? This medicine is for infusion into a vein. It is given by a health care professional in a hospital or clinic setting. Talk to your pediatrician regarding the use of this medicine in children. While this drug may be prescribed for selected conditions, precautions do apply. Overdosage: If you think you have taken too much of this medicine contact a poison control center or emergency room at once. NOTE: This medicine is only for you. Do not share this medicine with others. What if I miss a dose? This does not apply. What may interact with this medicine? This medicine may interact with the following medications: -certain medicines for anxiety or sleep -certain medicines for seizures like phenobarbital -digoxin -medicines that relax muscles for surgery -narcotic medicines for pain This list may not describe all possible interactions. Give your health care provider a list of all the medicines, herbs, non-prescription drugs, or dietary supplements you use. Also tell them if you smoke, drink alcohol, or use illegal drugs. Some items may interact with your medicine. What should I watch for while using this medicine? Your condition will be monitored carefully  while you are receiving this medicine. You may need blood work done while you are receiving this medicine. What side effects may I notice from receiving this medicine? Side effects that you should report to your doctor or health care professional as soon as possible: -allergic reactions like skin rash, itching or hives, swelling of the face, lips, or tongue -facial flushing -muscle weakness -signs and symptoms of low blood pressure like dizziness; feeling faint or lightheaded, falls; unusually weak or tired -signs and symptoms of a dangerous change in heartbeat or heart rhythm like chest pain; dizziness; fast or irregular heartbeat; palpitations; breathing problems -sweating This list may not describe all possible side effects. Call your doctor for medical advice about side effects. You may report side effects to FDA at 1-800-FDA-1088. Where should I keep my medicine? This drug is given in a hospital or clinic and will not be stored at home. NOTE: This sheet is a summary. It may not cover all possible information. If you have questions about this medicine, talk to your doctor, pharmacist, or health care provider.  2018 Elsevier/Gold Standard (2016-01-03 12:31:42)  

## 2016-12-24 NOTE — Progress Notes (Signed)
[facility] NOTE  Patient Care Team: Glean Hess, MD as PCP - General (Internal Medicine) Cammie Sickle, MD as Consulting Physician (Oncology) Jodell Cipro, MD as Referring Physician (Pain Medicine) Corey Skains, MD as Consulting Physician (Cardiology)  CHIEF COMPLAINTS/PURPOSE OF CONSULTATION:  Multiple myeloma  # MULTIPLE MYELOMA-   Oncology History   # June 2017- [facility] MULTIPLE MYELOMA [BMBx- 80% plasma cells; [facility]]; Lamda light chain 1340; s/p RVD   # 14th DEC 2016- Auto-Stem cell transplant [[facility];Dr.Hertzig]; rev [dose reduced sec to [place]  # Maintenance Revlimid- 70m 3 w-ON & 1 week OFF  # Bone lesions-numerous ~541mlytic lesions in Thoracic spine- on Zometa  # June 2016- Proteinuria [3gm/day]/ CKD III   # chronic back pain/ anxiety/ PN   # "Colitis" s/p multiple EGD/Colonoscopies     Multiple myeloma in remission ([facility]  04/02/2016 Initial Diagnosis    Multiple myeloma in remission ([facility]      HISTORY OF PRESENTING ILLNESS:  Sarah Trigg38.o.  female of her history of multiple myeloma diagnosed June 2016 currently on maintenance Revlimid after bone marrow transplant Is here for follow-up.  Patient recently had colitis diagnosed by CT scan in April 2018; C.diff was negative.   Patient notes to have pain/discomfort lower jaw- behind her molar tooth. She feels " bone exposed". She denies any jaw swelling. No fevers or chills.  Complains of chronic fatigue. Not any worse.  She has chronic diarrhea for which she has been taking Lomotil/colestipol. She continues to have chronic nausea for which she takes Phenergan. Not any worse.  ROS: A complete 10 point review of system is done which is negative except mentioned above in history of present illness  MEDICAL HISTORY:  Past Medical History:  Diagnosis Date  . Anemia   . Anxiety   . Arthritis   . Bicuspid  aortic valve   . CHF (congestive heart failure) ([facility]  . CKD (chronic kidney disease) stage 3, GFR 30-59 ml/min   . Depression   . Diabetes mellitus ([facility]  . Dizziness   . Fatty liver   . Frequent falls   . GERD (gastroesophageal reflux disease)   . Gout   . Heart murmur   . History of blood transfusion   . History of bone marrow transplant ([facility]  . History of uterine fibroid   . Hypertension   . Hypomagnesemia   . Multiple myeloma ([facility]  . Renal cyst     SURGICAL HISTORY: Past Surgical History:  Procedure Laterality Date  . 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 CARDIAC ELECTROPHYSIOLOGY MAPPING AND ABLATION    . CARPAL TUNNEL RELEASE Bilateral   . CHOLECYSTECTOMY  2008  . FOOT SURGERY Bilateral   . INCONTINENCE SURGERY  2009  . PARTIAL HYSTERECTOMY  03/1996   fibroids  . TONSILLECTOMY  2007    [place] quit smoking [place]; occasional alcohol; lives [place] with step sister.  Social History   Social History  . Marital status: Widowed    Spouse name: N/A  . Number of children: N/A  . Years of education: N/A   Occupational History  . Disabled    Social History Main Topics  . Smoking status: Former Smoker    Packs/day: 1.00    Years: 20.00    Types: Cigarettes    Quit date: 07/02/1991  .  Smokeless tobacco: Never Used  . Alcohol use 1.2 oz/week    1 Glasses of wine, 1 Cans of beer per week     Comment: occassional  . Drug use: No  . Sexual activity: No   Other Topics Concern  . Not on file   Social History Narrative  . No narrative on file    FAMILY HISTORY: Family History  Problem Relation Age of Onset  . Colon cancer Father   . Renal Disease Father   . Diabetes Mellitus II Father   . Melanoma Paternal Grandmother   . Breast cancer Maternal Aunt   . Anemia Mother   . Heart disease Mother   . Heart failure Mother   . Renal Disease Mother   . Congestive Heart Failure Mother   . Heart disease  Maternal Uncle   . Throat cancer Maternal Uncle   . Lung cancer Maternal Uncle   . Liver disease Maternal Uncle   . Heart failure Maternal Uncle   . Hearing loss Son 18       Suicide     ALLERGIES:  is allergic to oxycodone-acetaminophen; benadryl [diphenhydramine]; morphine; ondansetron; and tylenol [acetaminophen].  MEDICATIONS:  Current Outpatient Prescriptions  Medication Sig Dispense Refill  . acyclovir (ZOVIRAX) 200 MG capsule Take 2 capsules (400 mg total) by mouth 3 (three) times daily. 180 capsule 5  . allopurinol (ZYLOPRIM) 100 MG tablet Take 1 tablet (100 mg total) by mouth daily. 30 tablet 5  . bisoprolol (ZEBETA) 10 MG tablet Take 1 tablet (10 mg total) by mouth daily. 30 tablet 5  . Calcium Carbonate-Vit D-Min (CALCIUM 600+D PLUS MINERALS) 600-400 MG-UNIT TABS Take 1 tablet by mouth daily.     . cholestyramine (QUESTRAN) 4 g packet Take 4 g once daily. 60 each 3  . diphenoxylate-atropine (LOMOTIL) 2.5-0.025 MG tablet Take 2 tablets by mouth daily as needed for diarrhea or loose stools. 30 tablet 2  . DULoxetine (CYMBALTA) 60 MG capsule TAKE 1 CAPSULE(60 MG) BY MOUTH TWICE DAILY 60 capsule 0  . fentaNYL (DURAGESIC - DOSED MCG/HR) 25 MCG/HR patch Place 25 mcg onto the skin every 3 (three) days.    . fexofenadine (ALLEGRA) 180 MG tablet Take 1 tablet (180 mg total) by mouth daily. 30 tablet 1  . fluticasone (FLONASE) 50 MCG/ACT nasal spray Place 2 sprays into both nostrils daily. 16 g 0  . furosemide (LASIX) 20 MG tablet Take 0.5 tablets (10 mg total) by mouth daily as needed. 30 tablet 0  . hydrOXYzine (ATARAX/VISTARIL) 10 MG tablet TAKE 1 TABLET(10 MG) BY MOUTH THREE TIMES DAILY AS NEEDED 90 tablet 0  . montelukast (SINGULAIR) 10 MG tablet Take 1 tablet (10 mg total) by mouth at bedtime. 30 tablet 5  . Multiple Vitamins-Minerals (HAIR/SKIN/NAILS/BIOTIN PO) Take 1 capsule by mouth 3 (three) times daily.    Marland Kitchen omeprazole (PRILOSEC) 40 MG capsule Take 1 capsule (40 mg total) by  mouth daily. 30 capsule 5  . predniSONE (STERAPRED UNI-PAK 21 TAB) 10 MG (21) TBPK tablet Take 6 pills on day one, then 5, 4, 3, 2, 1, on each of the following days. 21 tablet 0  . promethazine (PHENERGAN) 25 MG tablet 1-2 pills prior to meals twice a day as needed 60 tablet 4  . REVLIMID 5 MG capsule Take 1 capsule (5 mg total) by mouth daily. X 21 days.  1 week off 21 capsule 0  . tiZANidine (ZANAFLEX) 4 MG tablet TAKE 1 TABLET BY MOUTH EVERY 8 HOURS  AS NEEDED, MUSCLE RELAXER FOR BACK PAIN 90 tablet 0  . zaleplon (SONATA) 10 MG capsule Take 1 capsule (10 mg total) by mouth at bedtime as needed for sleep. 30 capsule 3  . Zoledronic Acid (ZOMETA IV) Inject into the vein every 30 (thirty) days.     No current facility-administered medications for this visit.    Facility-Administered Medications Ordered in Other Visits  Medication Dose Route Frequency Provider Last Rate Last Dose  . 0.9 %  sodium chloride infusion   Intravenous Once Charlaine Dalton R, MD      . magnesium sulfate IVPB 4 g 100 mL  4 g Intravenous Once Charlaine Dalton R, MD          .  PHYSICAL EXAMINATION: ECOG PERFORMANCE STATUS: 1 - Symptomatic but completely ambulatory  Vitals:   12/24/16 0950  BP: 111/66  Pulse: 81  Temp: 97.7 F (36.5 C)   Filed Weights   12/24/16 0950  Weight: 126 lb 8.7 oz (57.4 kg)    GENERAL: Well-nourished well-developed; Alert, no distress and comfortable.  EYES: no pallor or icterus OROPHARYNX: no thrush; Right lower jaw medial- bone exposed.  NECK: supple, no masses felt LYMPH:  no palpable lymphadenopathy in the cervical, axillary or inguinal regions LUNGS: clear to auscultation and  No wheeze or crackles HEART/CVS: regular rate & rhythm and no murmurs; No lower extremity edema ABDOMEN: abdomen soft, non-tender and normal bowel sounds Musculoskeletal:no cyanosis of digits and no clubbing  PSYCH: alert & oriented x 3 with fluent speech NEURO: no focal motor/sensory  deficits SKIN:  no rashes or significant lesions  LABORATORY DATA:  I have reviewed the data as listed Lab Results  Component Value Date   WBC 3.5 (L) 12/24/2016   HGB 9.0 (L) 12/24/2016   HCT 26.0 (L) 12/24/2016   MCV 91.1 12/24/2016   PLT 114 (L) 12/24/2016    Recent Labs  09/24/16 1006 10/16/16 1328 10/22/16 0835 12/24/16 0937  NA 137 140 136 139  K 3.9 2.6* 3.7 3.7  CL 106 108 106 107  CO2 24 24 24  21*  GLUCOSE 118* 99 127* 124*  BUN 22* 14 23* 22*  CREATININE 1.01* 0.96 1.23* 1.16*  CALCIUM 9.1 8.8* 8.7* 7.0*  GFRNONAA 60* >60 47* 50*  GFRAA >60 >60 54* 58*  PROT 7.1 7.2  --  6.8  ALBUMIN 4.4 4.4  --  4.1  AST 29 25  --  26  ALT 24 26  --  19  ALKPHOS 85 77  --  94  BILITOT 0.7 0.5  --  0.5    RADIOGRAPHIC STUDIES: I have personally reviewed the radiological images as listed and agreed with the findings in the report. No results found.  ASSESSMENT & PLAN:   Multiple myeloma in remission ([place]) Multiple myeloma IgA lambda as per the records status post autologous stem cell transplant in December 2016 currently on Revlimid 42m 3 w On & 1 week OFF.   # Clinically no evidence of progression at this time. MARCH 2018- M- protein- NEGATIVE/ K-L=Normal. Skeletal survey- Normal. Myeloma labs from today pending.   # Right lower jaw- bone exposed- ? ONJ- HOLD ZOMETA; referral to Dental surgery; ? Dr.Parks.   # # Nausea chronic on Phenergan 25 mg; new script given.   # Chronic diarrhea alternating with constipation-  question Revlimid; On cholestyramine; along with lomotil. Improved  # weight loss-stable.  # Chronic mild-mod thrombocytopenia 120/ ? Revlimid vs others. Monitor for now.   #  Hypomagnesemia- 0.8 g. Recommend IV mag today; again in 6 months.   # Chronic back pain- pain management follow-up; on fentanyl  25 mc/ [Dr.James; [facility]; [place]]; no BTP. stable  # HOLD ZOMETA; continue every 4 weeks/ BMP and magnesium; IV supp Mag. Follow up with me in 4  weeks.     Cammie Sickle, MD 12/24/2016 10:50 AM

## 2016-12-24 NOTE — Telephone Encounter (Signed)
Sarah [name] I got this pt an appt with Dr Annamaria Helling [facility] surgeon on 01/10/2017 @ 1 pm. Note And Interactive facesheet was faxed to [place] at the office in [place],  [place] [address]   Appt given to pt in chemo area

## 2016-12-25 LAB — KAPPA/LAMBDA LIGHT CHAINS
KAPPA FREE LGHT CHN: 15.1 mg/L (ref 3.3–19.4)
KAPPA, LAMDA LIGHT CHAIN RATIO: 0.78 (ref 0.26–1.65)
Lambda free light chains: 19.3 mg/L (ref 5.7–26.3)

## 2016-12-26 LAB — MULTIPLE MYELOMA PANEL, SERUM
ALBUMIN SERPL ELPH-MCNC: 3.8 g/dL (ref 2.9–4.4)
ALBUMIN/GLOB SERPL: 1.6 (ref 0.7–1.7)
Alpha 1: 0.3 g/dL (ref 0.0–0.4)
Alpha2 Glob SerPl Elph-Mcnc: 0.8 g/dL (ref 0.4–1.0)
B-GLOBULIN SERPL ELPH-MCNC: 0.9 g/dL (ref 0.7–1.3)
GAMMA GLOB SERPL ELPH-MCNC: 0.5 g/dL (ref 0.4–1.8)
GLOBULIN, TOTAL: 2.5 g/dL (ref 2.2–3.9)
IGA: 150 mg/dL (ref 87–352)
IgG (Immunoglobin G), Serum: 418 mg/dL — ABNORMAL LOW (ref 700–1600)
IgM, Serum: 31 mg/dL (ref 26–217)
Total Protein ELP: 6.3 g/dL (ref 6.0–8.5)

## 2017-01-07 ENCOUNTER — Inpatient Hospital Stay: Payer: Managed Care, Other (non HMO) | Attending: Internal Medicine

## 2017-01-07 ENCOUNTER — Ambulatory Visit: Payer: 59

## 2017-01-07 DIAGNOSIS — I129 Hypertensive chronic kidney disease with stage 1 through stage 4 chronic kidney disease, or unspecified chronic kidney disease: Secondary | ICD-10-CM | POA: Insufficient documentation

## 2017-01-07 DIAGNOSIS — M109 Gout, unspecified: Secondary | ICD-10-CM | POA: Insufficient documentation

## 2017-01-07 DIAGNOSIS — Z9481 Bone marrow transplant status: Secondary | ICD-10-CM | POA: Insufficient documentation

## 2017-01-07 DIAGNOSIS — F419 Anxiety disorder, unspecified: Secondary | ICD-10-CM | POA: Diagnosis not present

## 2017-01-07 DIAGNOSIS — Z0000 Encounter for general adult medical examination without abnormal findings: Secondary | ICD-10-CM

## 2017-01-07 DIAGNOSIS — N183 Chronic kidney disease, stage 3 (moderate): Secondary | ICD-10-CM | POA: Insufficient documentation

## 2017-01-07 DIAGNOSIS — K760 Fatty (change of) liver, not elsewhere classified: Secondary | ICD-10-CM | POA: Insufficient documentation

## 2017-01-07 DIAGNOSIS — E1122 Type 2 diabetes mellitus with diabetic chronic kidney disease: Secondary | ICD-10-CM | POA: Diagnosis not present

## 2017-01-07 DIAGNOSIS — M549 Dorsalgia, unspecified: Secondary | ICD-10-CM | POA: Insufficient documentation

## 2017-01-07 DIAGNOSIS — Z79899 Other long term (current) drug therapy: Secondary | ICD-10-CM | POA: Diagnosis not present

## 2017-01-07 DIAGNOSIS — K219 Gastro-esophageal reflux disease without esophagitis: Secondary | ICD-10-CM | POA: Diagnosis not present

## 2017-01-07 DIAGNOSIS — F329 Major depressive disorder, single episode, unspecified: Secondary | ICD-10-CM | POA: Diagnosis not present

## 2017-01-07 DIAGNOSIS — C9000 Multiple myeloma not having achieved remission: Secondary | ICD-10-CM | POA: Insufficient documentation

## 2017-01-07 DIAGNOSIS — Q231 Congenital insufficiency of aortic valve: Secondary | ICD-10-CM | POA: Insufficient documentation

## 2017-01-07 DIAGNOSIS — Z23 Encounter for immunization: Secondary | ICD-10-CM | POA: Insufficient documentation

## 2017-01-07 DIAGNOSIS — I509 Heart failure, unspecified: Secondary | ICD-10-CM | POA: Diagnosis not present

## 2017-01-07 DIAGNOSIS — K529 Noninfective gastroenteritis and colitis, unspecified: Secondary | ICD-10-CM | POA: Insufficient documentation

## 2017-01-07 DIAGNOSIS — Z8719 Personal history of other diseases of the digestive system: Secondary | ICD-10-CM | POA: Insufficient documentation

## 2017-01-07 DIAGNOSIS — G629 Polyneuropathy, unspecified: Secondary | ICD-10-CM | POA: Insufficient documentation

## 2017-01-07 DIAGNOSIS — Z87891 Personal history of nicotine dependence: Secondary | ICD-10-CM | POA: Diagnosis not present

## 2017-01-07 MED ORDER — PNEUMOCOCCAL 13-VAL CONJ VACC IM SUSP
0.5000 mL | Freq: Once | INTRAMUSCULAR | Status: AC
  Administered 2017-01-07: 0.5 mL via INTRAMUSCULAR
  Filled 2017-01-07: qty 0.5

## 2017-01-10 ENCOUNTER — Other Ambulatory Visit: Payer: Self-pay | Admitting: Internal Medicine

## 2017-01-13 NOTE — Progress Notes (Signed)
Nutrition  Called patient for nutrition follow-up and left message to return my call.    Kirbi Farrugia B. Zenia Resides, [place], [place] Registered Dietitian [phone] (pager)

## 2017-01-20 NOTE — Assessment & Plan Note (Addendum)
Multiple myeloma IgA lambda as per the records status post autologous stem cell transplant in December 2016 currently on maintenance Revlimid 4m 3 w On & 1 week OFF. Clinically no evidence of progression at this time. JUNE 2018- M- protein- NEGATIVE/ K-L=Normal. Skeletal survey- Normal. Myeloma labs from today pending. Hb- 9.5; WBC- 3.2; platelets- 115.   # Given severe fatigue/nausea- give a trial HOLD revlimid; and see if symtoms improve. Pt agrees.   # Right lower jaw- bone exposed- ? ONJ- HOLD ZOMETA; s/p evaluated [facility]   # Nausea chronic on Phenergan 25 mg; new script given.   # Chronic diarrhea alternating with constipation-  question Revlimid; On cholestyramine; along with lomotil. Improved  # Chronic mild-mod thrombocytopenia 115/ ? Revlimid vs others. Since Rev is on HOLD will monitor.   #  Hypomagnesemia- 0.6 mg. Recommend IV mag today. Intol PO mag- sec to diarrhea.   # Chronic back pain- pain management follow-up; on fentanyl  25 mc/ [Dr.James; [facility]; [place]]; no BTP. Okay to proceed with SI   # HOLD ZOMETA; weekly magnesium check/IVmag; Follow up with me in 4 weeks; cbc/cmp;

## 2017-01-20 NOTE — Progress Notes (Signed)
[place] NOTE  Patient Care Team: Glean Hess, MD as PCP - General (Internal Medicine) Cammie Sickle, MD as Consulting Physician (Oncology) Jodell Cipro, MD as Referring Physician (Pain Medicine) Corey Skains, MD as Consulting Physician (Cardiology)  CHIEF COMPLAINTS/PURPOSE OF CONSULTATION:  Multiple myeloma  # MULTIPLE MYELOMA-   Oncology History   # June 2017- IgALamda MULTIPLE MYELOMA [BMBx- 80% plasma cells; [facility]]; Lamda light chain 1340; s/p RVD   # 14th DEC 2016- Auto-Stem cell transplant [[facility];Dr.Hertzig]; rev [dose reduced sec to [place]  # Maintenance Revlimid- 22m 3 w-ON & 1 week OFF  # Bone lesions-numerous ~540mlytic lesions in Thoracic spine- on Zometa  # June 2016- Proteinuria [3gm/day]/ CKD III   # chronic back pain/ anxiety/ PN   # "Colitis" s/p multiple EGD/Colonoscopies     Multiple myeloma in remission ([facility]  04/02/2016 Initial Diagnosis    Multiple myeloma in remission ([facility]      HISTORY OF PRESENTING ILLNESS:  Sarah [name].o.  female of her history of multiple myeloma diagnosed June 2016 currently on maintenance Revlimid after bone marrow transplant Is here for follow-up.  In the interim patient was evaluated by dentistry for jaw pain-; [facility]. She denies any jaw swelling. No fevers or chills. Currently being followed in the pain clinic for her pain issues. She is awaiting sacroiliac injections through pain clinic.   She complains of extreme fatigue. Also complains of chronic diarrhea- for which she has been taking Lomotil/colestipol.  She continues to have chronic nausea for which she takes Phenergan. Not any worse.Complains of cramps in the legs.   ROS: A complete 10 point review of system is done which is negative except mentioned above in history of present illness  MEDICAL  HISTORY:  Past Medical History:  Diagnosis Date  . Anemia   . Anxiety   . Arthritis   . Bicuspid aortic valve   . CHF (congestive heart failure) (H[place]  . CKD (chronic kidney disease) stage 3, GFR 30-59 ml/min   . Depression   . Diabetes mellitus (HCLane  . Dizziness   . Fatty liver   . Frequent falls   . GERD (gastroesophageal reflux disease)   . Gout   . Heart murmur   . History of blood transfusion   . History of bone marrow transplant (HCHay[place]  . History of uterine fibroid   . Hypertension   . Hypomagnesemia   . Multiple myeloma (HCWest Orange  . Renal cyst     SURGICAL HISTORY: Past Surgical History:  Procedure Laterality Date  . 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 CARDIAC ELECTROPHYSIOLOGY MAPPING AND ABLATION    . CARPAL TUNNEL RELEASE Bilateral   . CHOLECYSTECTOMY  2008  . FOOT SURGERY Bilateral   . INCONTINENCE SURGERY  2009  . PARTIAL HYSTERECTOMY  03/1996   fibroids  . TONSILLECTOMY  2007    [place]IAL [place]ORY[place] quit smoking 93; occasional alcohol; lives [place] with step sister.  Social History   Social History  . Marital status: Widowed    Spouse name: N/A  . Number of children: N/A  . Years of education: N/A   Occupational History  . Disabled    Social History Main Topics  . Smoking status: Former Smoker    Packs/day: 1.00  Years: 20.00    Types: Cigarettes    Quit date: 07/02/1991  . Smokeless tobacco: Never Used  . Alcohol use 1.2 oz/week    1 Glasses of wine, 1 Cans of beer per week     Comment: occassional  . Drug use: No  . Sexual activity: No   Other Topics Concern  . Not on file   Social History Narrative  . No narrative on file    FAMILY HISTORY: Family History  Problem Relation Age of Onset  . Colon cancer Father   . Renal Disease Father   . Diabetes Mellitus II Father   . Melanoma Paternal Grandmother   . Breast cancer Maternal Aunt   . Anemia Mother   . Heart disease Mother   . Heart  failure Mother   . Renal Disease Mother   . Congestive Heart Failure Mother   . Heart disease Maternal Uncle   . Throat cancer Maternal Uncle   . Lung cancer Maternal Uncle   . Liver disease Maternal Uncle   . Heart failure Maternal Uncle   . Hearing loss Son 18       Suicide     ALLERGIES:  is allergic to oxycodone-acetaminophen; benadryl [diphenhydramine]; morphine; ondansetron; and tylenol [acetaminophen].  MEDICATIONS:  Current Outpatient Prescriptions  Medication Sig Dispense Refill  . acyclovir (ZOVIRAX) 200 MG capsule Take 2 capsules (400 mg total) by mouth 3 (three) times daily. 180 capsule 5  . allopurinol (ZYLOPRIM) 100 MG tablet Take 1 tablet (100 mg total) by mouth daily. 30 tablet 5  . bisoprolol (ZEBETA) 10 MG tablet Take 1 tablet (10 mg total) by mouth daily. 30 tablet 5  . Calcium Carbonate-Vit D-Min (CALCIUM 600+D PLUS MINERALS) 600-400 MG-UNIT TABS Take 1 tablet by mouth daily.     . cholestyramine (QUESTRAN) 4 g packet Take 4 g once daily. 60 each 3  . diphenoxylate-atropine (LOMOTIL) 2.5-0.025 MG tablet Take 2 tablets by mouth daily as needed for diarrhea or loose stools. 30 tablet 2  . DULoxetine (CYMBALTA) 60 MG capsule TAKE 1 CAPSULE(60 MG) BY MOUTH TWICE DAILY 60 capsule 0  . fentaNYL (DURAGESIC - DOSED MCG/HR) 25 MCG/HR patch Place 25 mcg onto the skin every 3 (three) days.    . fexofenadine (ALLEGRA) 180 MG tablet Take 1 tablet (180 mg total) by mouth daily. 30 tablet 1  . fluticasone (FLONASE) 50 MCG/ACT nasal spray Place 2 sprays into both nostrils daily. 16 g 0  . furosemide (LASIX) 20 MG tablet Take 0.5 tablets (10 mg total) by mouth daily as needed. 30 tablet 0  . hydrOXYzine (ATARAX/VISTARIL) 10 MG tablet TAKE 1 TABLET(10 MG) BY MOUTH THREE TIMES DAILY AS NEEDED 90 tablet 0  . montelukast (SINGULAIR) 10 MG tablet Take 1 tablet (10 mg total) by mouth at bedtime. 30 tablet 5  . Multiple Vitamins-Minerals (HAIR/SKIN/NAILS/BIOTIN PO) Take 1 capsule by mouth 3  (three) times daily.    Marland Kitchen omeprazole (PRILOSEC) 40 MG capsule Take 1 capsule (40 mg total) by mouth daily. 30 capsule 5  . promethazine (PHENERGAN) 25 MG tablet 1-2 pills prior to meals twice a day as needed 60 tablet 4  . REVLIMID 5 MG capsule Take 1 capsule (5 mg total) by mouth daily. X 21 days.  1 week off 21 capsule 0  . tiZANidine (ZANAFLEX) 4 MG tablet TAKE 1 TABLET BY MOUTH EVERY 8 HOURS AS NEEDED, MUSCLE RELAXER FOR BACK PAIN 90 tablet 0  . zaleplon (SONATA) 10 MG capsule Take  1 capsule (10 mg total) by mouth at bedtime as needed for sleep. 30 capsule 3  . Zoledronic Acid (ZOMETA IV) Inject into the vein every 30 (thirty) days.     No current facility-administered medications for this visit.       Marland Kitchen  PHYSICAL EXAMINATION: ECOG PERFORMANCE STATUS: 1 - Symptomatic but completely ambulatory  Vitals:   01/21/17 0923  BP: 114/68  Pulse: 73  Resp: 16  Temp: 97.8 F (36.6 C)   Filed Weights   01/21/17 0923  Weight: 133 lb 7.8 oz (60.6 kg)    GENERAL: Well-nourished well-developed; Alert, no distress and comfortable.  EYES: no pallor or icterus OROPHARYNX: no thrush; Right lower jaw medial- bone exposed.  NECK: supple, no masses felt LYMPH:  no palpable lymphadenopathy in the cervical, axillary or inguinal regions LUNGS: clear to auscultation and  No wheeze or crackles HEART/CVS: regular rate & rhythm and no murmurs; No lower extremity edema ABDOMEN: abdomen soft, non-tender and normal bowel sounds Musculoskeletal:no cyanosis of digits and no clubbing  PSYCH: alert & oriented x 3 with fluent speech NEURO: no focal motor/sensory deficits SKIN:  no rashes or significant lesions  LABORATORY DATA:  I have reviewed the data as listed Lab Results  Component Value Date   WBC 3.2 (L) 01/21/2017   HGB 9.5 (L) 01/21/2017   HCT 27.2 (L) 01/21/2017   MCV 91.3 01/21/2017   PLT 115 (L) 01/21/2017    Recent Labs  09/24/16 1006 10/16/16 1328 10/22/16 0835 12/24/16 0937   NA 137 140 136 139  K 3.9 2.6* 3.7 3.7  CL 106 108 106 107  CO2 24 24 24  21*  GLUCOSE 118* 99 127* 124*  BUN 22* 14 23* 22*  CREATININE 1.01* 0.96 1.23* 1.16*  CALCIUM 9.1 8.8* 8.7* 7.0*  GFRNONAA 60* >60 47* 50*  GFRAA >60 >60 54* 58*  PROT 7.1 7.2  --  6.8  ALBUMIN 4.4 4.4  --  4.1  AST 29 25  --  26  ALT 24 26  --  19  ALKPHOS 85 77  --  94  BILITOT 0.7 0.5  --  0.5    Results for Sarah [name], Sarah [name] (MRN [id number]) as of 01/21/2017 09:32  Ref. Range 08/31/2015 00:00 02/26/2016 00:00 04/02/2016 16:03 04/08/2016 16:48 04/19/2016 16:04 05/07/2016 09:47 05/07/2016 09:52 05/21/2016 16:19 05/21/2016 16:43 05/21/2016 17:34 05/21/2016 18:42 05/22/2016 12:50 06/18/2016 11:15 07/23/2016 08:30 07/24/2016 11:49 08/28/2016 10:32 09/24/2016 10:06 09/24/2016 10:16 10/16/2016 13:28 10/16/2016 15:23 10/16/2016 16:19 10/22/2016 08:35 12/24/2016 09:37  Kappa free light chain Latest Ref Range: 3.3 - 19.4 mg/L   21.3 (H)          17.1    12.5      15.1  Lamda free light chains Latest Ref Range: 5.7 - 26.3 mg/L   15.2          15.8    14.6      19.3  Kappa, lamda light chain ratio Latest Ref Range: 0.26 - 1.65    1.40          1.08    0.86      0.78  M Protein SerPl Elph-Mcnc Latest Ref Range: Not Observed g/dL   Not Observed          Not Observed    Not Observed      Not Observed     RADIOGRAPHIC STUDIES: I have personally reviewed the radiological images as listed and agreed with the findings in the report. No  results found.  ASSESSMENT & PLAN:   Multiple myeloma in remission ([place]) Multiple myeloma IgA lambda as per the records status post autologous stem cell transplant in December 2016 currently on maintenance Revlimid 72m 3 w On & 1 week OFF. Clinically no evidence of progression at this time. JUNE 2018- M- protein- NEGATIVE/ K-L=Normal. Skeletal survey- Normal. Myeloma labs from today pending. Hb- 9.5; WBC- 3.2; platelets- 115.   # Given severe fatigue/nausea- give a trial HOLD revlimid; and see if symtoms  improve. Pt agrees.   # Right lower jaw- bone exposed- ? ONJ- HOLD ZOMETA; s/p evaluated Dr.Peters[facility]   # Nausea chronic on Phenergan 25 mg; new script given.   # Chronic diarrhea alternating with constipation-  question Revlimid; On cholestyramine; along with lomotil. Improved  # Chronic mild-mod thrombocytopenia 115/ ? Revlimid vs others. Since Rev is on HOLD will monitor.   #  Hypomagnesemia- 0.6 mg. Recommend IV mag today. Intol PO mag- sec to diarrhea.   # Chronic back pain- pain management follow-up; on fentanyl  25 mc/ [[facility]James; [facility] [place] no BTP. Okay to proceed with SI   # HOLD ZOMETA; weekly magnesium check/IVmag; Follow up with me in 4 weeks; cbc/cmp;     GCammie Sickle MD 01/21/2017 5:11 PM

## 2017-01-21 ENCOUNTER — Inpatient Hospital Stay: Payer: Managed Care, Other (non HMO)

## 2017-01-21 ENCOUNTER — Inpatient Hospital Stay (HOSPITAL_BASED_OUTPATIENT_CLINIC_OR_DEPARTMENT_OTHER): Payer: Managed Care, Other (non HMO) | Admitting: Internal Medicine

## 2017-01-21 VITALS — BP 135/69 | HR 77 | Resp 18

## 2017-01-21 DIAGNOSIS — F329 Major depressive disorder, single episode, unspecified: Secondary | ICD-10-CM

## 2017-01-21 DIAGNOSIS — F419 Anxiety disorder, unspecified: Secondary | ICD-10-CM

## 2017-01-21 DIAGNOSIS — N183 Chronic kidney disease, stage 3 (moderate): Secondary | ICD-10-CM

## 2017-01-21 DIAGNOSIS — I509 Heart failure, unspecified: Secondary | ICD-10-CM | POA: Diagnosis not present

## 2017-01-21 DIAGNOSIS — Z87891 Personal history of nicotine dependence: Secondary | ICD-10-CM

## 2017-01-21 DIAGNOSIS — I129 Hypertensive chronic kidney disease with stage 1 through stage 4 chronic kidney disease, or unspecified chronic kidney disease: Secondary | ICD-10-CM

## 2017-01-21 DIAGNOSIS — C9001 Multiple myeloma in remission: Secondary | ICD-10-CM

## 2017-01-21 DIAGNOSIS — G629 Polyneuropathy, unspecified: Secondary | ICD-10-CM

## 2017-01-21 DIAGNOSIS — C9000 Multiple myeloma not having achieved remission: Secondary | ICD-10-CM

## 2017-01-21 DIAGNOSIS — Z23 Encounter for immunization: Secondary | ICD-10-CM

## 2017-01-21 DIAGNOSIS — M549 Dorsalgia, unspecified: Secondary | ICD-10-CM

## 2017-01-21 DIAGNOSIS — K529 Noninfective gastroenteritis and colitis, unspecified: Secondary | ICD-10-CM

## 2017-01-21 DIAGNOSIS — M109 Gout, unspecified: Secondary | ICD-10-CM

## 2017-01-21 DIAGNOSIS — Z8719 Personal history of other diseases of the digestive system: Secondary | ICD-10-CM

## 2017-01-21 DIAGNOSIS — K760 Fatty (change of) liver, not elsewhere classified: Secondary | ICD-10-CM

## 2017-01-21 DIAGNOSIS — Z9481 Bone marrow transplant status: Secondary | ICD-10-CM

## 2017-01-21 DIAGNOSIS — K219 Gastro-esophageal reflux disease without esophagitis: Secondary | ICD-10-CM

## 2017-01-21 DIAGNOSIS — Q231 Congenital insufficiency of aortic valve: Secondary | ICD-10-CM | POA: Diagnosis not present

## 2017-01-21 DIAGNOSIS — Z79899 Other long term (current) drug therapy: Secondary | ICD-10-CM | POA: Diagnosis not present

## 2017-01-21 DIAGNOSIS — E1122 Type 2 diabetes mellitus with diabetic chronic kidney disease: Secondary | ICD-10-CM

## 2017-01-21 LAB — CBC WITH DIFFERENTIAL/PLATELET
BASOS ABS: 0 10*3/uL (ref 0–0.1)
Basophils Relative: 1 %
Eosinophils Absolute: 0.1 10*3/uL (ref 0–0.7)
Eosinophils Relative: 4 %
HEMATOCRIT: 27.2 % — AB (ref 35.0–47.0)
HEMOGLOBIN: 9.5 g/dL — AB (ref 12.0–16.0)
LYMPHS ABS: 0.6 10*3/uL — AB (ref 1.0–3.6)
LYMPHS PCT: 20 %
MCH: 31.9 pg (ref 26.0–34.0)
MCHC: 34.9 g/dL (ref 32.0–36.0)
MCV: 91.3 fL (ref 80.0–100.0)
Monocytes Absolute: 0.3 10*3/uL (ref 0.2–0.9)
Monocytes Relative: 9 %
NEUTROS ABS: 2.1 10*3/uL (ref 1.4–6.5)
Neutrophils Relative %: 66 %
Platelets: 115 10*3/uL — ABNORMAL LOW (ref 150–440)
RBC: 2.98 MIL/uL — AB (ref 3.80–5.20)
RDW: 15.1 % — ABNORMAL HIGH (ref 11.5–14.5)
WBC: 3.2 10*3/uL — ABNORMAL LOW (ref 3.6–11.0)

## 2017-01-21 LAB — MAGNESIUM: Magnesium: 0.9 mg/dL — CL (ref 1.7–2.4)

## 2017-01-21 MED ORDER — SODIUM CHLORIDE 0.9 % IV SOLN
Freq: Once | INTRAVENOUS | Status: AC
  Administered 2017-01-21: 10:00:00 via INTRAVENOUS
  Filled 2017-01-21: qty 1000

## 2017-01-21 MED ORDER — MAGNESIUM SULFATE 4 GM/100ML IV SOLN
4.0000 g | Freq: Once | INTRAVENOUS | Status: AC
  Administered 2017-01-21: 4 g via INTRAVENOUS

## 2017-01-21 NOTE — Progress Notes (Signed)
Patient is here today for a follow up. Patient would like to discuss blood work results.

## 2017-01-21 NOTE — Patient Instructions (Signed)
Magnesium Sulfate injection What is this medicine? MAGNESIUM SULFATE (mag NEE zee um SUL fate) is an electrolyte injection commonly used to treat low magnesium levels in your blood. It is also used to prevent or control seizures in women with preeclampsia or eclampsia. This medicine may be used for other purposes; ask your health care provider or pharmacist if you have questions. What should I tell my health care provider before I take this medicine? They need to know if you have any of these conditions: -heart disease -history of irregular heart beat -kidney disease -an unusual or allergic reaction to magnesium sulfate, medicines, foods, dyes, or preservatives -pregnant or trying to get pregnant -breast-feeding How should I use this medicine? This medicine is for infusion into a vein. It is given by a health care professional in a hospital or clinic setting. Talk to your pediatrician regarding the use of this medicine in children. While this drug may be prescribed for selected conditions, precautions do apply. Overdosage: If you think you have taken too much of this medicine contact a poison control center or emergency room at once. NOTE: This medicine is only for you. Do not share this medicine with others. What if I miss a dose? This does not apply. What may interact with this medicine? This medicine may interact with the following medications: -certain medicines for anxiety or sleep -certain medicines for seizures like phenobarbital -digoxin -medicines that relax muscles for surgery -narcotic medicines for pain This list may not describe all possible interactions. Give your health care provider a list of all the medicines, herbs, non-prescription drugs, or dietary supplements you use. Also tell them if you smoke, drink alcohol, or use illegal drugs. Some items may interact with your medicine. What should I watch for while using this medicine? Your condition will be monitored carefully  while you are receiving this medicine. You may need blood work done while you are receiving this medicine. What side effects may I notice from receiving this medicine? Side effects that you should report to your doctor or health care professional as soon as possible: -allergic reactions like skin rash, itching or hives, swelling of the face, lips, or tongue -facial flushing -muscle weakness -signs and symptoms of low blood pressure like dizziness; feeling faint or lightheaded, falls; unusually weak or tired -signs and symptoms of a dangerous change in heartbeat or heart rhythm like chest pain; dizziness; fast or irregular heartbeat; palpitations; breathing problems -sweating This list may not describe all possible side effects. Call your doctor for medical advice about side effects. You may report side effects to FDA at 1-800-FDA-1088. Where should I keep my medicine? This drug is given in a hospital or clinic and will not be stored at home. NOTE: This sheet is a summary. It may not cover all possible information. If you have questions about this medicine, talk to your doctor, pharmacist, or health care provider.  2018 Elsevier/Gold Standard (2016-01-03 12:31:42)  

## 2017-01-21 NOTE — Progress Notes (Signed)
Critical mag level called to Nira Conn, Therapist, sports by [employer] in lab dept. Read back process complete for Mag level of 0.9.  (call at 959 am).  Dr. Rogue Bussing informed of critical value at 10 am. Read back process performed with md.

## 2017-01-28 ENCOUNTER — Inpatient Hospital Stay: Payer: Managed Care, Other (non HMO)

## 2017-01-28 ENCOUNTER — Other Ambulatory Visit: Payer: Self-pay | Admitting: Internal Medicine

## 2017-01-28 VITALS — BP 128/76 | HR 91 | Temp 98.5°F | Resp 16

## 2017-01-28 DIAGNOSIS — C9001 Multiple myeloma in remission: Secondary | ICD-10-CM

## 2017-01-28 DIAGNOSIS — C9000 Multiple myeloma not having achieved remission: Secondary | ICD-10-CM | POA: Diagnosis not present

## 2017-01-28 LAB — MAGNESIUM: MAGNESIUM: 1 mg/dL — AB (ref 1.7–2.4)

## 2017-01-28 MED ORDER — SODIUM CHLORIDE 0.9 % IV SOLN
Freq: Once | INTRAVENOUS | Status: AC
  Administered 2017-01-28: 09:00:00 via INTRAVENOUS
  Filled 2017-01-28: qty 1000

## 2017-01-28 MED ORDER — MAGNESIUM SULFATE 4 GM/100ML IV SOLN
4.0000 g | Freq: Once | INTRAVENOUS | Status: AC
  Administered 2017-01-28: 4 g via INTRAVENOUS

## 2017-01-28 MED ORDER — MAGNESIUM SULFATE 4 GM/100ML IV SOLN
INTRAVENOUS | Status: AC
  Filled 2017-01-28: qty 100

## 2017-02-04 ENCOUNTER — Inpatient Hospital Stay: Payer: Managed Care, Other (non HMO)

## 2017-02-04 ENCOUNTER — Inpatient Hospital Stay: Payer: Managed Care, Other (non HMO) | Attending: Internal Medicine

## 2017-02-04 VITALS — BP 124/73 | HR 64 | Resp 18

## 2017-02-04 DIAGNOSIS — R110 Nausea: Secondary | ICD-10-CM | POA: Diagnosis not present

## 2017-02-04 DIAGNOSIS — Z801 Family history of malignant neoplasm of trachea, bronchus and lung: Secondary | ICD-10-CM | POA: Insufficient documentation

## 2017-02-04 DIAGNOSIS — M549 Dorsalgia, unspecified: Secondary | ICD-10-CM | POA: Insufficient documentation

## 2017-02-04 DIAGNOSIS — E119 Type 2 diabetes mellitus without complications: Secondary | ICD-10-CM | POA: Diagnosis not present

## 2017-02-04 DIAGNOSIS — M109 Gout, unspecified: Secondary | ICD-10-CM | POA: Diagnosis not present

## 2017-02-04 DIAGNOSIS — C9000 Multiple myeloma not having achieved remission: Secondary | ICD-10-CM | POA: Diagnosis not present

## 2017-02-04 DIAGNOSIS — N183 Chronic kidney disease, stage 3 (moderate): Secondary | ICD-10-CM | POA: Diagnosis not present

## 2017-02-04 DIAGNOSIS — D649 Anemia, unspecified: Secondary | ICD-10-CM | POA: Insufficient documentation

## 2017-02-04 DIAGNOSIS — F419 Anxiety disorder, unspecified: Secondary | ICD-10-CM | POA: Insufficient documentation

## 2017-02-04 DIAGNOSIS — G8929 Other chronic pain: Secondary | ICD-10-CM | POA: Insufficient documentation

## 2017-02-04 DIAGNOSIS — Z9481 Bone marrow transplant status: Secondary | ICD-10-CM | POA: Insufficient documentation

## 2017-02-04 DIAGNOSIS — K219 Gastro-esophageal reflux disease without esophagitis: Secondary | ICD-10-CM | POA: Insufficient documentation

## 2017-02-04 DIAGNOSIS — I129 Hypertensive chronic kidney disease with stage 1 through stage 4 chronic kidney disease, or unspecified chronic kidney disease: Secondary | ICD-10-CM | POA: Insufficient documentation

## 2017-02-04 DIAGNOSIS — K5900 Constipation, unspecified: Secondary | ICD-10-CM | POA: Insufficient documentation

## 2017-02-04 DIAGNOSIS — C9001 Multiple myeloma in remission: Secondary | ICD-10-CM

## 2017-02-04 DIAGNOSIS — M879 Osteonecrosis, unspecified: Secondary | ICD-10-CM | POA: Diagnosis not present

## 2017-02-04 DIAGNOSIS — Z79899 Other long term (current) drug therapy: Secondary | ICD-10-CM | POA: Diagnosis not present

## 2017-02-04 DIAGNOSIS — Z803 Family history of malignant neoplasm of breast: Secondary | ICD-10-CM | POA: Insufficient documentation

## 2017-02-04 DIAGNOSIS — Z87891 Personal history of nicotine dependence: Secondary | ICD-10-CM | POA: Insufficient documentation

## 2017-02-04 DIAGNOSIS — M129 Arthropathy, unspecified: Secondary | ICD-10-CM | POA: Insufficient documentation

## 2017-02-04 DIAGNOSIS — I509 Heart failure, unspecified: Secondary | ICD-10-CM | POA: Insufficient documentation

## 2017-02-04 DIAGNOSIS — F329 Major depressive disorder, single episode, unspecified: Secondary | ICD-10-CM | POA: Diagnosis not present

## 2017-02-04 LAB — MAGNESIUM: MAGNESIUM: 1.4 mg/dL — AB (ref 1.7–2.4)

## 2017-02-04 MED ORDER — MAGNESIUM SULFATE 4 GM/100ML IV SOLN
4.0000 g | Freq: Once | INTRAVENOUS | Status: AC
  Administered 2017-02-04: 4 g via INTRAVENOUS
  Filled 2017-02-04: qty 100

## 2017-02-04 MED ORDER — SODIUM CHLORIDE 0.9 % IV SOLN
Freq: Once | INTRAVENOUS | Status: AC
Start: 2017-02-04 — End: 2017-02-04
  Administered 2017-02-04: 10:00:00 via INTRAVENOUS
  Filled 2017-02-04: qty 1000

## 2017-02-04 NOTE — Patient Instructions (Signed)
Magnesium Sulfate injection What is this medicine? MAGNESIUM SULFATE (mag NEE zee um SUL fate) is an electrolyte injection commonly used to treat low magnesium levels in your blood. It is also used to prevent or control seizures in women with preeclampsia or eclampsia. This medicine may be used for other purposes; ask your health care provider or pharmacist if you have questions. What should I tell my health care provider before I take this medicine? They need to know if you have any of these conditions: -heart disease -history of irregular heart beat -kidney disease -an unusual or allergic reaction to magnesium sulfate, medicines, foods, dyes, or preservatives -pregnant or trying to get pregnant -breast-feeding How should I use this medicine? This medicine is for infusion into a vein. It is given by a health care professional in a hospital or clinic setting. Talk to your pediatrician regarding the use of this medicine in children. While this drug may be prescribed for selected conditions, precautions do apply. Overdosage: If you think you have taken too much of this medicine contact a poison control center or emergency room at once. NOTE: This medicine is only for you. Do not share this medicine with others. What if I miss a dose? This does not apply. What may interact with this medicine? This medicine may interact with the following medications: -certain medicines for anxiety or sleep -certain medicines for seizures like phenobarbital -digoxin -medicines that relax muscles for surgery -narcotic medicines for pain This list may not describe all possible interactions. Give your health care provider a list of all the medicines, herbs, non-prescription drugs, or dietary supplements you use. Also tell them if you smoke, drink alcohol, or use illegal drugs. Some items may interact with your medicine. What should I watch for while using this medicine? Your condition will be monitored carefully  while you are receiving this medicine. You may need blood work done while you are receiving this medicine. What side effects may I notice from receiving this medicine? Side effects that you should report to your doctor or health care professional as soon as possible: -allergic reactions like skin rash, itching or hives, swelling of the face, lips, or tongue -facial flushing -muscle weakness -signs and symptoms of low blood pressure like dizziness; feeling faint or lightheaded, falls; unusually weak or tired -signs and symptoms of a dangerous change in heartbeat or heart rhythm like chest pain; dizziness; fast or irregular heartbeat; palpitations; breathing problems -sweating This list may not describe all possible side effects. Call your doctor for medical advice about side effects. You may report side effects to FDA at 1-800-FDA-1088. Where should I keep my medicine? This drug is given in a hospital or clinic and will not be stored at home. NOTE: This sheet is a summary. It may not cover all possible information. If you have questions about this medicine, talk to your doctor, pharmacist, or health care provider.  2018 Elsevier/Gold Standard (2016-01-03 12:31:42)  

## 2017-02-07 ENCOUNTER — Other Ambulatory Visit: Payer: Self-pay | Admitting: Internal Medicine

## 2017-02-07 ENCOUNTER — Telehealth: Payer: Self-pay

## 2017-02-07 NOTE — Telephone Encounter (Signed)
Faxed zaleplon Rx to pharmacy verified by patient on file.

## 2017-02-11 ENCOUNTER — Other Ambulatory Visit: Payer: Managed Care, Other (non HMO)

## 2017-02-11 ENCOUNTER — Ambulatory Visit: Payer: Managed Care, Other (non HMO)

## 2017-02-12 ENCOUNTER — Inpatient Hospital Stay: Payer: Managed Care, Other (non HMO)

## 2017-02-12 VITALS — BP 145/80 | HR 75 | Resp 18

## 2017-02-12 DIAGNOSIS — C9001 Multiple myeloma in remission: Secondary | ICD-10-CM

## 2017-02-12 DIAGNOSIS — C9000 Multiple myeloma not having achieved remission: Secondary | ICD-10-CM | POA: Diagnosis not present

## 2017-02-12 LAB — MAGNESIUM: MAGNESIUM: 1.2 mg/dL — AB (ref 1.7–2.4)

## 2017-02-12 MED ORDER — SODIUM CHLORIDE 0.9 % IV SOLN
Freq: Once | INTRAVENOUS | Status: AC
  Administered 2017-02-12: 09:00:00 via INTRAVENOUS
  Filled 2017-02-12: qty 1000

## 2017-02-12 MED ORDER — MAGNESIUM SULFATE 4 GM/100ML IV SOLN
INTRAVENOUS | Status: AC
  Filled 2017-02-12: qty 100

## 2017-02-12 MED ORDER — MAGNESIUM SULFATE 4 GM/100ML IV SOLN
4.0000 g | Freq: Once | INTRAVENOUS | Status: AC
  Administered 2017-02-12: 4 g via INTRAVENOUS

## 2017-02-12 NOTE — Patient Instructions (Signed)
Magnesium Sulfate injection What is this medicine? MAGNESIUM SULFATE (mag NEE zee um SUL fate) is an electrolyte injection commonly used to treat low magnesium levels in your blood. It is also used to prevent or control seizures in women with preeclampsia or eclampsia. This medicine may be used for other purposes; ask your health care provider or pharmacist if you have questions. What should I tell my health care provider before I take this medicine? They need to know if you have any of these conditions: -heart disease -history of irregular heart beat -kidney disease -an unusual or allergic reaction to magnesium sulfate, medicines, foods, dyes, or preservatives -pregnant or trying to get pregnant -breast-feeding How should I use this medicine? This medicine is for infusion into a vein. It is given by a health care professional in a hospital or clinic setting. Talk to your pediatrician regarding the use of this medicine in children. While this drug may be prescribed for selected conditions, precautions do apply. Overdosage: If you think you have taken too much of this medicine contact a poison control center or emergency room at once. NOTE: This medicine is only for you. Do not share this medicine with others. What if I miss a dose? This does not apply. What may interact with this medicine? This medicine may interact with the following medications: -certain medicines for anxiety or sleep -certain medicines for seizures like phenobarbital -digoxin -medicines that relax muscles for surgery -narcotic medicines for pain This list may not describe all possible interactions. Give your health care provider a list of all the medicines, herbs, non-prescription drugs, or dietary supplements you use. Also tell them if you smoke, drink alcohol, or use illegal drugs. Some items may interact with your medicine. What should I watch for while using this medicine? Your condition will be monitored carefully  while you are receiving this medicine. You may need blood work done while you are receiving this medicine. What side effects may I notice from receiving this medicine? Side effects that you should report to your doctor or health care professional as soon as possible: -allergic reactions like skin rash, itching or hives, swelling of the face, lips, or tongue -facial flushing -muscle weakness -signs and symptoms of low blood pressure like dizziness; feeling faint or lightheaded, falls; unusually weak or tired -signs and symptoms of a dangerous change in heartbeat or heart rhythm like chest pain; dizziness; fast or irregular heartbeat; palpitations; breathing problems -sweating This list may not describe all possible side effects. Call your doctor for medical advice about side effects. You may report side effects to FDA at 1-800-FDA-1088. Where should I keep my medicine? This drug is given in a hospital or clinic and will not be stored at home. NOTE: This sheet is a summary. It may not cover all possible information. If you have questions about this medicine, talk to your doctor, pharmacist, or health care provider.  2018 Elsevier/Gold Standard (2016-01-03 12:31:42)  

## 2017-02-17 ENCOUNTER — Other Ambulatory Visit: Payer: Self-pay | Admitting: *Deleted

## 2017-02-17 DIAGNOSIS — C9001 Multiple myeloma in remission: Secondary | ICD-10-CM

## 2017-02-18 ENCOUNTER — Inpatient Hospital Stay: Payer: Managed Care, Other (non HMO) | Attending: Internal Medicine

## 2017-02-18 ENCOUNTER — Inpatient Hospital Stay: Payer: Managed Care, Other (non HMO)

## 2017-02-18 ENCOUNTER — Inpatient Hospital Stay (HOSPITAL_BASED_OUTPATIENT_CLINIC_OR_DEPARTMENT_OTHER): Payer: Managed Care, Other (non HMO) | Admitting: Internal Medicine

## 2017-02-18 VITALS — BP 129/84 | HR 71 | Temp 97.4°F | Wt 133.7 lb

## 2017-02-18 DIAGNOSIS — I509 Heart failure, unspecified: Secondary | ICD-10-CM

## 2017-02-18 DIAGNOSIS — E119 Type 2 diabetes mellitus without complications: Secondary | ICD-10-CM

## 2017-02-18 DIAGNOSIS — N183 Chronic kidney disease, stage 3 (moderate): Secondary | ICD-10-CM

## 2017-02-18 DIAGNOSIS — M109 Gout, unspecified: Secondary | ICD-10-CM

## 2017-02-18 DIAGNOSIS — I129 Hypertensive chronic kidney disease with stage 1 through stage 4 chronic kidney disease, or unspecified chronic kidney disease: Secondary | ICD-10-CM

## 2017-02-18 DIAGNOSIS — G8929 Other chronic pain: Secondary | ICD-10-CM

## 2017-02-18 DIAGNOSIS — Z79899 Other long term (current) drug therapy: Secondary | ICD-10-CM

## 2017-02-18 DIAGNOSIS — F419 Anxiety disorder, unspecified: Secondary | ICD-10-CM

## 2017-02-18 DIAGNOSIS — D649 Anemia, unspecified: Secondary | ICD-10-CM

## 2017-02-18 DIAGNOSIS — K5900 Constipation, unspecified: Secondary | ICD-10-CM

## 2017-02-18 DIAGNOSIS — K219 Gastro-esophageal reflux disease without esophagitis: Secondary | ICD-10-CM

## 2017-02-18 DIAGNOSIS — M129 Arthropathy, unspecified: Secondary | ICD-10-CM | POA: Diagnosis not present

## 2017-02-18 DIAGNOSIS — C9001 Multiple myeloma in remission: Secondary | ICD-10-CM

## 2017-02-18 DIAGNOSIS — M549 Dorsalgia, unspecified: Secondary | ICD-10-CM

## 2017-02-18 DIAGNOSIS — Z803 Family history of malignant neoplasm of breast: Secondary | ICD-10-CM

## 2017-02-18 DIAGNOSIS — Z9481 Bone marrow transplant status: Secondary | ICD-10-CM

## 2017-02-18 DIAGNOSIS — C9000 Multiple myeloma not having achieved remission: Secondary | ICD-10-CM | POA: Diagnosis not present

## 2017-02-18 DIAGNOSIS — M879 Osteonecrosis, unspecified: Secondary | ICD-10-CM

## 2017-02-18 DIAGNOSIS — Z87891 Personal history of nicotine dependence: Secondary | ICD-10-CM

## 2017-02-18 DIAGNOSIS — Z801 Family history of malignant neoplasm of trachea, bronchus and lung: Secondary | ICD-10-CM

## 2017-02-18 DIAGNOSIS — F329 Major depressive disorder, single episode, unspecified: Secondary | ICD-10-CM

## 2017-02-18 DIAGNOSIS — R110 Nausea: Secondary | ICD-10-CM

## 2017-02-18 LAB — CBC WITH DIFFERENTIAL/PLATELET
Basophils Absolute: 0 10*3/uL (ref 0–0.1)
Basophils Relative: 0 %
EOS PCT: 4 %
Eosinophils Absolute: 0.2 10*3/uL (ref 0–0.7)
HEMATOCRIT: 36.1 % (ref 35.0–47.0)
Hemoglobin: 12.4 g/dL (ref 12.0–16.0)
LYMPHS PCT: 14 %
Lymphs Abs: 0.8 10*3/uL — ABNORMAL LOW (ref 1.0–3.6)
MCH: 31.5 pg (ref 26.0–34.0)
MCHC: 34.4 g/dL (ref 32.0–36.0)
MCV: 91.6 fL (ref 80.0–100.0)
MONO ABS: 0.4 10*3/uL (ref 0.2–0.9)
MONOS PCT: 7 %
NEUTROS ABS: 4.4 10*3/uL (ref 1.4–6.5)
Neutrophils Relative %: 75 %
PLATELETS: 125 10*3/uL — AB (ref 150–440)
RBC: 3.94 MIL/uL (ref 3.80–5.20)
RDW: 14.2 % (ref 11.5–14.5)
WBC: 5.9 10*3/uL (ref 3.6–11.0)

## 2017-02-18 LAB — COMPREHENSIVE METABOLIC PANEL
ALK PHOS: 103 U/L (ref 38–126)
ALT: 20 U/L (ref 14–54)
ANION GAP: 11 (ref 5–15)
AST: 24 U/L (ref 15–41)
Albumin: 4.5 g/dL (ref 3.5–5.0)
BILIRUBIN TOTAL: 0.6 mg/dL (ref 0.3–1.2)
BUN: 25 mg/dL — AB (ref 6–20)
CO2: 23 mmol/L (ref 22–32)
Calcium: 9.9 mg/dL (ref 8.9–10.3)
Chloride: 102 mmol/L (ref 101–111)
Creatinine, Ser: 1.12 mg/dL — ABNORMAL HIGH (ref 0.44–1.00)
GFR calc Af Amer: >60 mL/min (ref >60)
GFR calc non Af Amer: 52 mL/min — ABNORMAL LOW (ref >60)
GLUCOSE: 156 mg/dL — AB (ref 65–99)
POTASSIUM: 4.4 mmol/L (ref 3.5–5.1)
SODIUM: 136 mmol/L (ref 135–145)
Total Protein: 7.4 g/dL (ref 6.5–8.1)

## 2017-02-18 LAB — MAGNESIUM: Magnesium: 1.3 mg/dL — ABNORMAL LOW (ref 1.7–2.4)

## 2017-02-18 MED ORDER — MAGNESIUM SULFATE 4 GM/100ML IV SOLN
4.0000 g | Freq: Once | INTRAVENOUS | Status: AC
  Administered 2017-02-18: 4 g via INTRAVENOUS
  Filled 2017-02-18: qty 100

## 2017-02-18 MED ORDER — SODIUM CHLORIDE 0.9 % IV SOLN
Freq: Once | INTRAVENOUS | Status: AC
  Administered 2017-02-18: 09:00:00 via INTRAVENOUS
  Filled 2017-02-18: qty 1000

## 2017-02-18 NOTE — Progress Notes (Signed)
[place] NOTE  Patient Care Team: Glean Hess, MD as PCP - General (Internal Medicine) Cammie Sickle, MD as Consulting Physician (Oncology) Jodell Cipro, MD as Referring Physician (Pain Medicine) Corey Skains, MD as Consulting Physician (Cardiology)  CHIEF COMPLAINTS/PURPOSE OF CONSULTATION:  Multiple myeloma  # MULTIPLE MYELOMA-   Oncology History   # June 2017- IgALamda MULTIPLE MYELOMA [BMBx- 80% plasma cells; [facility]]; Lamda light chain 1340; s/p RVD   # 14th DEC 2016- Auto-Stem cell transplant [[facility];Dr.Hertzig]; rev [dose reduced sec to [place]  # Maintenance Revlimid- 52m 3 w-ON & 1 week OFF; HELD July 2018- sec to ONJ  # Bone lesions-numerous ~51mlytic lesions in Thoracic spine- on Zometa  # 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  female of her history of multiple myeloma diagnosed June 2016 currently on maintenance Revlimid after bone marrow transplant Is here for follow-up.  In the interim patient was diagnosed with osteonecrosis of the jaw. [facility] Revlimid is on hold since July 2018- given improvement of myeloma protein/multiple issues-including osteonecrosis the jaw  Patient fatigue is improved. Diarrhea is also improved. Diarrhea slightly improved.   She continues to have chronic nausea for which she takes Phenergan. Not any worse.  ROS: A complete 10 point review of system is done which is negative except mentioned above in history of present illness  MEDICAL HISTORY:  Past Medical History:  Diagnosis Date  . Anemia   . Anxiety   . Arthritis   . Bicuspid aortic valve   .  CHF (congestive heart failure) (HCWinnetoon  . CKD (chronic kidney disease) stage 3, GFR 30-59 ml/min   . Depression   . Diabetes mellitus (HCFergus [place]  . Dizziness   . Fatty liver   . Frequent falls   . GERD (gastroesophageal reflux disease)   . Gout   . Heart murmur   . History of blood transfusion   . History of bone marrow transplant (HCMcLean  . History of uterine fibroid   . Hypertension   . Hypomagnesemia   . Multiple myeloma (HCStanaf[place]  . Renal cyst     SURGICAL HISTORY: Past Surgical History:  Procedure Laterality Date  . 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 CARDIAC ELECTROPHYSIOLOGY MAPPING AND ABLATION    . CARPAL TUNNEL RELEASE Bilateral   . CHOLECYSTECTOMY  2008  . FOOT SURGERY Bilateral   . INCONTINENCE SURGERY  2009  . PARTIAL HYSTERECTOMY  03/1996   fibroids  . TONSILLECTOMY  2007    [place]IAL [place]ORY[place]; quit smoking 93; occasional alcohol; lives [place] with step sister.  Social History   Social History  . Marital status: Widowed    Spouse name: N/A  . Number of children: N/A  . Years of education: N/A   Occupational History  . Disabled    Social History Main Topics  . Smoking status: Former Smoker    Packs/day: 1.00    Years: 20.00    Types: Cigarettes    Quit date: 07/02/1991  . Smokeless tobacco: Never Used  . Alcohol use 1.2 oz/week  1 Glasses of wine, 1 Cans of beer per week     Comment: occassional  . Drug use: No  . Sexual activity: No   Other Topics Concern  . Not on file   Social History Narrative  . No narrative on file    FAMILY HISTORY: Family History  Problem Relation Age of Onset  . Colon cancer Father   . Renal Disease Father   . Diabetes Mellitus II Father   . Melanoma Paternal Grandmother   . Breast cancer Maternal Aunt   . Anemia Mother   . Heart disease Mother   . Heart failure Mother   . Renal Disease Mother   . Congestive Heart Failure Mother   . Heart disease Maternal Uncle   .  Throat cancer Maternal Uncle   . Lung cancer Maternal Uncle   . Liver disease Maternal Uncle   . Heart failure Maternal Uncle   . Hearing loss Son 18       Suicide     ALLERGIES:  is allergic to oxycodone-acetaminophen; benadryl [diphenhydramine]; morphine; ondansetron; and tylenol [acetaminophen].  MEDICATIONS:  Current Outpatient Prescriptions  Medication Sig Dispense Refill  . acyclovir (ZOVIRAX) 200 MG capsule Take 2 capsules (400 mg total) by mouth 3 (three) times daily. 180 capsule 5  . allopurinol (ZYLOPRIM) 100 MG tablet Take 1 tablet (100 mg total) by mouth daily. 30 tablet 5  . bisoprolol (ZEBETA) 10 MG tablet TAKE 1 TABLET(10 MG) BY MOUTH DAILY 30 tablet 0  . Calcium Carbonate-Vit D-Min (CALCIUM 600+D PLUS MINERALS) 600-400 MG-UNIT TABS Take 1 tablet by mouth daily.     . cholestyramine (QUESTRAN) 4 g packet Take 4 g once daily. 60 each 3  . diphenoxylate-atropine (LOMOTIL) 2.5-0.025 MG tablet Take 2 tablets by mouth daily as needed for diarrhea or loose stools. 30 tablet 2  . DULoxetine (CYMBALTA) 60 MG capsule TAKE 1 CAPSULE(60 MG) BY MOUTH TWICE DAILY 60 capsule 0  . fentaNYL (DURAGESIC - DOSED MCG/HR) 25 MCG/HR patch Place 25 mcg onto the skin every 3 (three) days.    . fexofenadine (ALLEGRA) 180 MG tablet Take 1 tablet (180 mg total) by mouth daily. 30 tablet 1  . fluticasone (FLONASE) 50 MCG/ACT nasal spray Place 2 sprays into both nostrils daily. 16 g 0  . furosemide (LASIX) 20 MG tablet Take 0.5 tablets (10 mg total) by mouth daily as needed. 30 tablet 0  . hydrOXYzine (ATARAX/VISTARIL) 10 MG tablet TAKE 1 TABLET(10 MG) BY MOUTH THREE TIMES DAILY AS NEEDED 90 tablet 0  . montelukast (SINGULAIR) 10 MG tablet Take 1 tablet (10 mg total) by mouth at bedtime. 30 tablet 5  . Multiple Vitamins-Minerals (HAIR/SKIN/NAILS/BIOTIN PO) Take 1 capsule by mouth 3 (three) times daily.    Marland Kitchen omeprazole (PRILOSEC) 40 MG capsule Take 1 capsule (40 mg total) by mouth daily. 30 capsule 5  .  promethazine (PHENERGAN) 25 MG tablet 1-2 pills prior to meals twice a day as needed 60 tablet 4  . tiZANidine (ZANAFLEX) 4 MG tablet TAKE 1 TABLET BY MOUTH EVERY 8 HOURS AS NEEDED, MUSCLE RELAXER FOR BACK PAIN 90 tablet 0  . zaleplon (SONATA) 10 MG capsule TAKE 1 CAPSULE BY MOUTH EVERY DAY AT BEDTIME AS NEEDED FOR SLEEP 30 capsule 5  . REVLIMID 5 MG capsule Take 1 capsule (5 mg total) by mouth daily. X 21 days.  1 week off (Patient not taking: Reported on 02/18/2017) 21 capsule 0  . Zoledronic Acid (ZOMETA IV) Inject into the  vein every 30 (thirty) days.     No current facility-administered medications for this visit.       Marland Kitchen  PHYSICAL EXAMINATION: ECOG PERFORMANCE STATUS: 1 - Symptomatic but completely ambulatory  Vitals:   02/18/17 0842  BP: 129/84  Pulse: 71  Temp: (!) 97.4 F (36.3 C)   Filed Weights   02/18/17 0842  Weight: 133 lb 11 oz (60.6 kg)    GENERAL: Well-nourished well-developed; Alert, no distress and comfortable.  EYES: no pallor or icterus OROPHARYNX: no thrush; Right lower jaw medial- bone exposed.  NECK: supple, no masses felt LYMPH:  no palpable lymphadenopathy in the cervical, axillary or inguinal regions LUNGS: clear to auscultation and  No wheeze or crackles HEART/CVS: regular rate & rhythm and no murmurs; No lower extremity edema ABDOMEN: abdomen soft, non-tender and normal bowel sounds Musculoskeletal:no cyanosis of digits and no clubbing  PSYCH: alert & oriented x 3 with fluent speech NEURO: no focal motor/sensory deficits SKIN:  no rashes or significant lesions  LABORATORY DATA:  I have reviewed the data as listed Lab Results  Component Value Date   WBC 5.9 02/18/2017   HGB 12.4 02/18/2017   HCT 36.1 02/18/2017   MCV 91.6 02/18/2017   PLT 125 (L) 02/18/2017    Recent Labs  10/16/16 1328 10/22/16 0835 12/24/16 0937 02/18/17 0826  NA 140 136 139 136  K 2.6* 3.7 3.7 4.4  CL 108 106 107 102  CO2 24 24 21* 23  GLUCOSE 99 127* 124*  156*  BUN 14 23* 22* 25*  CREATININE 0.96 1.23* 1.16* 1.12*  CALCIUM 8.8* 8.7* 7.0* 9.9  GFRNONAA >60 47* 50* 52*  GFRAA >60 54* 58* >60  PROT 7.2  --  6.8 7.4  ALBUMIN 4.4  --  4.1 4.5  AST 25  --  26 24  ALT 26  --  19 20  ALKPHOS 77  --  94 103  BILITOT 0.5  --  0.5 0.6    Results for JACOBA, CHERNEY (MRN [id number]) as of 01/21/2017 09:32  Ref. Range 08/31/2015 00:00 02/26/2016 00:00 04/02/2016 16:03 04/08/2016 16:48 04/19/2016 16:04 05/07/2016 09:47 05/07/2016 09:52 05/21/2016 16:19 05/21/2016 16:43 05/21/2016 17:34 05/21/2016 18:42 05/22/2016 12:50 06/18/2016 11:15 07/23/2016 08:30 07/24/2016 11:49 08/28/2016 10:32 09/24/2016 10:06 09/24/2016 10:16 10/16/2016 13:28 10/16/2016 15:23 10/16/2016 16:19 10/22/2016 08:35 12/24/2016 09:37  Kappa free light chain Latest Ref Range: 3.3 - 19.4 mg/L   21.3 (H)          17.1    12.5      15.1  Lamda free light chains Latest Ref Range: 5.7 - 26.3 mg/L   15.2          15.8    14.6      19.3  Kappa, lamda light chain ratio Latest Ref Range: 0.26 - 1.65    1.40          1.08    0.86      0.78  M Protein SerPl Elph-Mcnc Latest Ref Range: Not Observed g/dL   Not Observed          Not Observed    Not Observed      Not Observed     RADIOGRAPHIC STUDIES: I have personally reviewed the radiological images as listed and agreed with the findings in the report. No results found.  ASSESSMENT & PLAN:   Multiple myeloma in remission ([place]) Multiple myeloma IgA lambda status post autologous stem cell transplant in December 2016 [[facility]  HELD revlimid since July 2018 sec to ONJ [maintenance- 94m 3 w On & 1 week OFF] . JUNE 2018- M- protein- NEGATIVE/ K-L=Normal. Skeletal survey- Normal. Hb- 12; WBC- 5.9 platelets- 125. # Clinically no evidence of progression at this time. Continue to HOLD Revlimid for now.  # Given severe fatigue/nausea- improved; ? off Revlimid.    # ONJ- AUG 2018- discontinue ZOMETA; s/p evaluated Dr.Peters/ [facility]  [facility]   # Nausea chronic on Phenergan 25 mg; new script given.   # Chronic diarrhea alternating with constipation-  question Revlimid; improved.  Off colestyramine.   #  Hypomagnesemia- 0.6 mg. Recommend IV mag weekly. Intol PO mag- sec to diarrhea.   # Chronic back pain- pain management follow-up; on fentanyl  25 mc/ [Dr.James; [facility] [place]]; no BTP.   # HOLD ZOMETA; weekly magnesium check/IVmag; Follow up with me in 4 weeks; cbc/cmp; myleoma panel/kappa-lamda light chains.     GCammie Sickle MD 02/26/2017 8:13 AM

## 2017-02-18 NOTE — Assessment & Plan Note (Addendum)
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  HELD revlimid since July 2018 sec to ONJ [maintenance- 67m 3 w On & 1 week OFF] . JUNE 2018- M- protein- NEGATIVE/ K-L=Normal. Skeletal survey- Normal. Hb- 12; WBC- 5.9 platelets- 125. # Clinically no evidence of progression at this time. Continue to HOLD Revlimid for now.  # Given severe fatigue/nausea- improved; ? off Revlimid.    # ONJ- AUG 2018- discontinue ZOMETA; s/p [facility] Dr[facility]Peters[facility]   # Nausea chronic on Phenergan 25 mg; new script given.   # Chronic diarrhea alternating with constipation-  question Revlimid; improved.  Off colestyramine.   #  Hypomagnesemia- 0.6 mg. Recommend IV mag weekly. Intol PO mag- sec to diarrhea.   # Chronic back pain- pain management follow-up; on fentanyl  25 mc/ [Dr.James; [facility] [place]]; no BTP.   # HOLD ZOMETA; weekly magnesium check/IVmag; Follow up with me in 4 weeks; cbc/cmp; myleoma panel/kappa-lamda light chains.

## 2017-02-18 NOTE — Progress Notes (Signed)
Patient is here today for a follow up. Patient complains of a sore throat, and feeling weak today.

## 2017-02-25 ENCOUNTER — Other Ambulatory Visit: Payer: Managed Care, Other (non HMO)

## 2017-02-25 ENCOUNTER — Ambulatory Visit: Payer: Managed Care, Other (non HMO)

## 2017-02-25 DIAGNOSIS — M87180 Osteonecrosis due to drugs, jaw: Secondary | ICD-10-CM | POA: Insufficient documentation

## 2017-02-25 DIAGNOSIS — T458X5A Adverse effect of other primarily systemic and hematological agents, initial encounter: Secondary | ICD-10-CM

## 2017-02-25 HISTORY — DX: Osteonecrosis due to drugs, jaw: M87.180

## 2017-02-25 HISTORY — DX: Adverse effect of other primarily systemic and hematological agents, initial encounter: T45.8X5A

## 2017-02-26 ENCOUNTER — Inpatient Hospital Stay: Payer: Managed Care, Other (non HMO)

## 2017-02-26 VITALS — BP 143/90 | HR 80 | Temp 96.5°F | Resp 18

## 2017-02-26 DIAGNOSIS — C9001 Multiple myeloma in remission: Secondary | ICD-10-CM

## 2017-02-26 DIAGNOSIS — C9000 Multiple myeloma not having achieved remission: Secondary | ICD-10-CM | POA: Diagnosis not present

## 2017-02-26 LAB — MAGNESIUM: Magnesium: 1.4 mg/dL — ABNORMAL LOW (ref 1.7–2.4)

## 2017-02-26 MED ORDER — SODIUM CHLORIDE 0.9 % IV SOLN
Freq: Once | INTRAVENOUS | Status: AC
  Administered 2017-02-26: 10:00:00 via INTRAVENOUS
  Filled 2017-02-26: qty 1000

## 2017-02-26 MED ORDER — MAGNESIUM SULFATE 4 GM/100ML IV SOLN
4.0000 g | Freq: Once | INTRAVENOUS | Status: AC
  Administered 2017-02-26: 4 g via INTRAVENOUS
  Filled 2017-02-26: qty 100

## 2017-03-04 ENCOUNTER — Inpatient Hospital Stay: Payer: Managed Care, Other (non HMO) | Attending: Internal Medicine

## 2017-03-04 ENCOUNTER — Inpatient Hospital Stay: Payer: Managed Care, Other (non HMO)

## 2017-03-04 VITALS — BP 125/82 | HR 81 | Resp 18

## 2017-03-04 DIAGNOSIS — Q231 Congenital insufficiency of aortic valve: Secondary | ICD-10-CM | POA: Insufficient documentation

## 2017-03-04 DIAGNOSIS — E1122 Type 2 diabetes mellitus with diabetic chronic kidney disease: Secondary | ICD-10-CM | POA: Diagnosis not present

## 2017-03-04 DIAGNOSIS — M549 Dorsalgia, unspecified: Secondary | ICD-10-CM | POA: Insufficient documentation

## 2017-03-04 DIAGNOSIS — Z87891 Personal history of nicotine dependence: Secondary | ICD-10-CM | POA: Insufficient documentation

## 2017-03-04 DIAGNOSIS — F329 Major depressive disorder, single episode, unspecified: Secondary | ICD-10-CM | POA: Insufficient documentation

## 2017-03-04 DIAGNOSIS — M879 Osteonecrosis, unspecified: Secondary | ICD-10-CM | POA: Insufficient documentation

## 2017-03-04 DIAGNOSIS — K5900 Constipation, unspecified: Secondary | ICD-10-CM | POA: Insufficient documentation

## 2017-03-04 DIAGNOSIS — G8929 Other chronic pain: Secondary | ICD-10-CM | POA: Insufficient documentation

## 2017-03-04 DIAGNOSIS — I130 Hypertensive heart and chronic kidney disease with heart failure and stage 1 through stage 4 chronic kidney disease, or unspecified chronic kidney disease: Secondary | ICD-10-CM | POA: Insufficient documentation

## 2017-03-04 DIAGNOSIS — Z8719 Personal history of other diseases of the digestive system: Secondary | ICD-10-CM | POA: Insufficient documentation

## 2017-03-04 DIAGNOSIS — R42 Dizziness and giddiness: Secondary | ICD-10-CM | POA: Insufficient documentation

## 2017-03-04 DIAGNOSIS — C9001 Multiple myeloma in remission: Secondary | ICD-10-CM

## 2017-03-04 DIAGNOSIS — N183 Chronic kidney disease, stage 3 (moderate): Secondary | ICD-10-CM | POA: Insufficient documentation

## 2017-03-04 DIAGNOSIS — M109 Gout, unspecified: Secondary | ICD-10-CM | POA: Insufficient documentation

## 2017-03-04 DIAGNOSIS — N281 Cyst of kidney, acquired: Secondary | ICD-10-CM | POA: Insufficient documentation

## 2017-03-04 DIAGNOSIS — I509 Heart failure, unspecified: Secondary | ICD-10-CM | POA: Insufficient documentation

## 2017-03-04 DIAGNOSIS — K219 Gastro-esophageal reflux disease without esophagitis: Secondary | ICD-10-CM | POA: Insufficient documentation

## 2017-03-04 DIAGNOSIS — G629 Polyneuropathy, unspecified: Secondary | ICD-10-CM | POA: Insufficient documentation

## 2017-03-04 DIAGNOSIS — Z800 Family history of malignant neoplasm of digestive organs: Secondary | ICD-10-CM | POA: Insufficient documentation

## 2017-03-04 DIAGNOSIS — Z801 Family history of malignant neoplasm of trachea, bronchus and lung: Secondary | ICD-10-CM | POA: Insufficient documentation

## 2017-03-04 DIAGNOSIS — R197 Diarrhea, unspecified: Secondary | ICD-10-CM | POA: Diagnosis not present

## 2017-03-04 DIAGNOSIS — K760 Fatty (change of) liver, not elsewhere classified: Secondary | ICD-10-CM | POA: Diagnosis not present

## 2017-03-04 DIAGNOSIS — Z23 Encounter for immunization: Secondary | ICD-10-CM | POA: Insufficient documentation

## 2017-03-04 DIAGNOSIS — F419 Anxiety disorder, unspecified: Secondary | ICD-10-CM | POA: Insufficient documentation

## 2017-03-04 DIAGNOSIS — Z79899 Other long term (current) drug therapy: Secondary | ICD-10-CM | POA: Insufficient documentation

## 2017-03-04 DIAGNOSIS — Z9484 Stem cells transplant status: Secondary | ICD-10-CM | POA: Insufficient documentation

## 2017-03-04 DIAGNOSIS — D649 Anemia, unspecified: Secondary | ICD-10-CM | POA: Diagnosis not present

## 2017-03-04 DIAGNOSIS — R110 Nausea: Secondary | ICD-10-CM | POA: Diagnosis not present

## 2017-03-04 DIAGNOSIS — Z9181 History of falling: Secondary | ICD-10-CM | POA: Insufficient documentation

## 2017-03-04 DIAGNOSIS — Z803 Family history of malignant neoplasm of breast: Secondary | ICD-10-CM | POA: Insufficient documentation

## 2017-03-04 LAB — MAGNESIUM: Magnesium: 1.3 mg/dL — ABNORMAL LOW (ref 1.7–2.4)

## 2017-03-04 MED ORDER — MAGNESIUM SULFATE 4 GM/100ML IV SOLN
4.0000 g | Freq: Once | INTRAVENOUS | Status: AC
  Administered 2017-03-04: 4 g via INTRAVENOUS
  Filled 2017-03-04: qty 100

## 2017-03-04 MED ORDER — SODIUM CHLORIDE 0.9 % IV SOLN
Freq: Once | INTRAVENOUS | Status: AC
  Administered 2017-03-04: 10:00:00 via INTRAVENOUS
  Filled 2017-03-04: qty 1000

## 2017-03-04 NOTE — Patient Instructions (Signed)
Magnesium Sulfate injection What is this medicine? MAGNESIUM SULFATE (mag NEE zee um SUL fate) is an electrolyte injection commonly used to treat low magnesium levels in your blood. It is also used to prevent or control seizures in women with preeclampsia or eclampsia. This medicine may be used for other purposes; ask your health care provider or pharmacist if you have questions. What should I tell my health care provider before I take this medicine? They need to know if you have any of these conditions: -heart disease -history of irregular heart beat -kidney disease -an unusual or allergic reaction to magnesium sulfate, medicines, foods, dyes, or preservatives -pregnant or trying to get pregnant -breast-feeding How should I use this medicine? This medicine is for infusion into a vein. It is given by a health care professional in a hospital or clinic setting. Talk to your pediatrician regarding the use of this medicine in children. While this drug may be prescribed for selected conditions, precautions do apply. Overdosage: If you think you have taken too much of this medicine contact a poison control center or emergency room at once. NOTE: This medicine is only for you. Do not share this medicine with others. What if I miss a dose? This does not apply. What may interact with this medicine? This medicine may interact with the following medications: -certain medicines for anxiety or sleep -certain medicines for seizures like phenobarbital -digoxin -medicines that relax muscles for surgery -narcotic medicines for pain This list may not describe all possible interactions. Give your health care provider a list of all the medicines, herbs, non-prescription drugs, or dietary supplements you use. Also tell them if you smoke, drink alcohol, or use illegal drugs. Some items may interact with your medicine. What should I watch for while using this medicine? Your condition will be monitored carefully  while you are receiving this medicine. You may need blood work done while you are receiving this medicine. What side effects may I notice from receiving this medicine? Side effects that you should report to your doctor or health care professional as soon as possible: -allergic reactions like skin rash, itching or hives, swelling of the face, lips, or tongue -facial flushing -muscle weakness -signs and symptoms of low blood pressure like dizziness; feeling faint or lightheaded, falls; unusually weak or tired -signs and symptoms of a dangerous change in heartbeat or heart rhythm like chest pain; dizziness; fast or irregular heartbeat; palpitations; breathing problems -sweating This list may not describe all possible side effects. Call your doctor for medical advice about side effects. You may report side effects to FDA at 1-800-FDA-1088. Where should I keep my medicine? This drug is given in a hospital or clinic and will not be stored at home. NOTE: This sheet is a summary. It may not cover all possible information. If you have questions about this medicine, talk to your doctor, pharmacist, or health care provider.  2018 Elsevier/Gold Standard (2016-01-03 12:31:42)  

## 2017-03-07 ENCOUNTER — Other Ambulatory Visit: Payer: Self-pay | Admitting: Internal Medicine

## 2017-03-11 ENCOUNTER — Inpatient Hospital Stay: Payer: Managed Care, Other (non HMO)

## 2017-03-11 VITALS — BP 128/74 | HR 89 | Temp 95.2°F | Resp 18

## 2017-03-11 DIAGNOSIS — C9001 Multiple myeloma in remission: Secondary | ICD-10-CM

## 2017-03-11 LAB — MAGNESIUM: MAGNESIUM: 1.3 mg/dL — AB (ref 1.7–2.4)

## 2017-03-11 MED ORDER — PNEUMOCOCCAL 13-VAL CONJ VACC IM SUSP
0.5000 mL | INTRAMUSCULAR | Status: AC
  Administered 2017-03-11: 0.5 mL via INTRAMUSCULAR
  Filled 2017-03-11: qty 0.5

## 2017-03-11 MED ORDER — MAGNESIUM SULFATE 4 GM/100ML IV SOLN
4.0000 g | Freq: Once | INTRAVENOUS | Status: AC
  Administered 2017-03-11: 4 g via INTRAVENOUS
  Filled 2017-03-11: qty 100

## 2017-03-11 MED ORDER — SODIUM CHLORIDE 0.9 % IV SOLN
Freq: Once | INTRAVENOUS | Status: AC
  Administered 2017-03-11: 09:00:00 via INTRAVENOUS
  Filled 2017-03-11: qty 1000

## 2017-03-11 NOTE — Patient Instructions (Signed)
Pneumococcal Conjugate Vaccine suspension for injection What is this medicine? PNEUMOCOCCAL VACCINE (NEU mo KOK al vak SEEN) is a vaccine used to prevent pneumococcus bacterial infections. These bacteria can cause serious infections like pneumonia, meningitis, and blood infections. This vaccine will lower your chance of getting pneumonia. If you do get pneumonia, it can make your symptoms milder and your illness shorter. This vaccine will not treat an infection and will not cause infection. This vaccine is recommended for infants and young children, adults with certain medical conditions, and adults 63 years or older. This medicine may be used for other purposes; ask your health care provider or pharmacist if you have questions. COMMON BRAND NAME(S): Prevnar, Prevnar 13 What should I tell my health care provider before I take this medicine? They need to know if you have any of these conditions: -bleeding problems -fever -immune system problems -an unusual or allergic reaction to pneumococcal vaccine, diphtheria toxoid, other vaccines, latex, other medicines, foods, dyes, or preservatives -pregnant or trying to get pregnant -breast-feeding How should I use this medicine? This vaccine is for injection into a muscle. It is given by a health care professional. A copy of Vaccine Information Statements will be given before each vaccination. Read this sheet carefully each time. The sheet may change frequently. Talk to your pediatrician regarding the use of this medicine in children. While this drug may be prescribed for children as young as 69 weeks old for selected conditions, precautions do apply. Overdosage: If you think you have taken too much of this medicine contact a poison control center or emergency room at once. NOTE: This medicine is only for you. Do not share this medicine with others. What if I miss a dose? It is important not to miss your dose. Call your doctor or health care professional  if you are unable to keep an appointment. What may interact with this medicine? -medicines for cancer chemotherapy -medicines that suppress your immune function -steroid medicines like prednisone or cortisone This list may not describe all possible interactions. Give your health care provider a list of all the medicines, herbs, non-prescription drugs, or dietary supplements you use. Also tell them if you smoke, drink alcohol, or use illegal drugs. Some items may interact with your medicine. What should I watch for while using this medicine? Mild fever and pain should go away in 3 days or less. Report any unusual symptoms to your doctor or health care professional. What side effects may I notice from receiving this medicine? Side effects that you should report to your doctor or health care professional as soon as possible: -allergic reactions like skin rash, itching or hives, swelling of the face, lips, or tongue -breathing problems -confused -fast or irregular heartbeat -fever over 102 degrees F -seizures -unusual bleeding or bruising -unusual muscle weakness Side effects that usually do not require medical attention (report to your doctor or health care professional if they continue or are bothersome): -aches and pains -diarrhea -fever of 102 degrees F or less -headache -irritable -loss of appetite -pain, tender at site where injected -trouble sleeping This list may not describe all possible side effects. Call your doctor for medical advice about side effects. You may report side effects to FDA at 1-800-FDA-1088. Where should I keep my medicine? This does not apply. This vaccine is given in a clinic, pharmacy, doctor's office, or other health care setting and will not be stored at home. NOTE: This sheet is a summary. It may not cover all possible information.  If you have questions about this medicine, talk to your doctor, pharmacist, or health care provider.  2018 Elsevier/Gold  Standard (2014-03-24 10:27:27) Magnesium Sulfate injection What is this medicine? MAGNESIUM SULFATE (mag NEE zee um SUL fate) is an electrolyte injection commonly used to treat low magnesium levels in your blood. It is also used to prevent or control seizures in women with preeclampsia or eclampsia. This medicine may be used for other purposes; ask your health care provider or pharmacist if you have questions. What should I tell my health care provider before I take this medicine? They need to know if you have any of these conditions: -heart disease -history of irregular heart beat -kidney disease -an unusual or allergic reaction to magnesium sulfate, medicines, foods, dyes, or preservatives -pregnant or trying to get pregnant -breast-feeding How should I use this medicine? This medicine is for infusion into a vein. It is given by a health care professional in a hospital or clinic setting. Talk to your pediatrician regarding the use of this medicine in children. While this drug may be prescribed for selected conditions, precautions do apply. Overdosage: If you think you have taken too much of this medicine contact a poison control center or emergency room at once. NOTE: This medicine is only for you. Do not share this medicine with others. What if I miss a dose? This does not apply. What may interact with this medicine? This medicine may interact with the following medications: -certain medicines for anxiety or sleep -certain medicines for seizures like phenobarbital -digoxin -medicines that relax muscles for surgery -narcotic medicines for pain This list may not describe all possible interactions. Give your health care provider a list of all the medicines, herbs, non-prescription drugs, or dietary supplements you use. Also tell them if you smoke, drink alcohol, or use illegal drugs. Some items may interact with your medicine. What should I watch for while using this medicine? Your  condition will be monitored carefully while you are receiving this medicine. You may need blood work done while you are receiving this medicine. What side effects may I notice from receiving this medicine? Side effects that you should report to your doctor or health care professional as soon as possible: -allergic reactions like skin rash, itching or hives, swelling of the face, lips, or tongue -facial flushing -muscle weakness -signs and symptoms of low blood pressure like dizziness; feeling faint or lightheaded, falls; unusually weak or tired -signs and symptoms of a dangerous change in heartbeat or heart rhythm like chest pain; dizziness; fast or irregular heartbeat; palpitations; breathing problems -sweating This list may not describe all possible side effects. Call your doctor for medical advice about side effects. You may report side effects to FDA at 1-800-FDA-1088. Where should I keep my medicine? This drug is given in a hospital or clinic and will not be stored at home. NOTE: This sheet is a summary. It may not cover all possible information. If you have questions about this medicine, talk to your doctor, pharmacist, or health care provider.  2018 Elsevier/Gold Standard (2016-01-03 12:31:42)

## 2017-03-18 ENCOUNTER — Inpatient Hospital Stay: Payer: Managed Care, Other (non HMO)

## 2017-03-18 ENCOUNTER — Inpatient Hospital Stay (HOSPITAL_BASED_OUTPATIENT_CLINIC_OR_DEPARTMENT_OTHER): Payer: Managed Care, Other (non HMO) | Admitting: Internal Medicine

## 2017-03-18 ENCOUNTER — Encounter: Payer: Self-pay | Admitting: Internal Medicine

## 2017-03-18 VITALS — BP 115/71 | HR 105 | Temp 96.4°F | Resp 18

## 2017-03-18 VITALS — BP 131/80 | HR 80 | Temp 98.1°F | Resp 16 | Wt 144.5 lb

## 2017-03-18 DIAGNOSIS — C9001 Multiple myeloma in remission: Secondary | ICD-10-CM

## 2017-03-18 DIAGNOSIS — R42 Dizziness and giddiness: Secondary | ICD-10-CM

## 2017-03-18 DIAGNOSIS — M109 Gout, unspecified: Secondary | ICD-10-CM

## 2017-03-18 DIAGNOSIS — G629 Polyneuropathy, unspecified: Secondary | ICD-10-CM | POA: Diagnosis not present

## 2017-03-18 DIAGNOSIS — D649 Anemia, unspecified: Secondary | ICD-10-CM

## 2017-03-18 DIAGNOSIS — R197 Diarrhea, unspecified: Secondary | ICD-10-CM | POA: Diagnosis not present

## 2017-03-18 DIAGNOSIS — I509 Heart failure, unspecified: Secondary | ICD-10-CM

## 2017-03-18 DIAGNOSIS — F419 Anxiety disorder, unspecified: Secondary | ICD-10-CM

## 2017-03-18 DIAGNOSIS — M8719 Osteonecrosis due to drugs, multiple sites: Secondary | ICD-10-CM

## 2017-03-18 DIAGNOSIS — Z801 Family history of malignant neoplasm of trachea, bronchus and lung: Secondary | ICD-10-CM

## 2017-03-18 DIAGNOSIS — Z9181 History of falling: Secondary | ICD-10-CM

## 2017-03-18 DIAGNOSIS — N183 Chronic kidney disease, stage 3 (moderate): Secondary | ICD-10-CM

## 2017-03-18 DIAGNOSIS — Z923 Personal history of irradiation: Secondary | ICD-10-CM

## 2017-03-18 DIAGNOSIS — I130 Hypertensive heart and chronic kidney disease with heart failure and stage 1 through stage 4 chronic kidney disease, or unspecified chronic kidney disease: Secondary | ICD-10-CM

## 2017-03-18 DIAGNOSIS — Q231 Congenital insufficiency of aortic valve: Secondary | ICD-10-CM

## 2017-03-18 DIAGNOSIS — M549 Dorsalgia, unspecified: Secondary | ICD-10-CM

## 2017-03-18 DIAGNOSIS — G8929 Other chronic pain: Secondary | ICD-10-CM

## 2017-03-18 DIAGNOSIS — Z8719 Personal history of other diseases of the digestive system: Secondary | ICD-10-CM

## 2017-03-18 DIAGNOSIS — R110 Nausea: Secondary | ICD-10-CM

## 2017-03-18 DIAGNOSIS — N281 Cyst of kidney, acquired: Secondary | ICD-10-CM

## 2017-03-18 DIAGNOSIS — K760 Fatty (change of) liver, not elsewhere classified: Secondary | ICD-10-CM

## 2017-03-18 DIAGNOSIS — Z87891 Personal history of nicotine dependence: Secondary | ICD-10-CM

## 2017-03-18 DIAGNOSIS — E1122 Type 2 diabetes mellitus with diabetic chronic kidney disease: Secondary | ICD-10-CM

## 2017-03-18 DIAGNOSIS — K5900 Constipation, unspecified: Secondary | ICD-10-CM

## 2017-03-18 DIAGNOSIS — Z79899 Other long term (current) drug therapy: Secondary | ICD-10-CM

## 2017-03-18 DIAGNOSIS — Z803 Family history of malignant neoplasm of breast: Secondary | ICD-10-CM

## 2017-03-18 DIAGNOSIS — Z800 Family history of malignant neoplasm of digestive organs: Secondary | ICD-10-CM

## 2017-03-18 DIAGNOSIS — F329 Major depressive disorder, single episode, unspecified: Secondary | ICD-10-CM

## 2017-03-18 DIAGNOSIS — Z9484 Stem cells transplant status: Secondary | ICD-10-CM

## 2017-03-18 DIAGNOSIS — K219 Gastro-esophageal reflux disease without esophagitis: Secondary | ICD-10-CM

## 2017-03-18 LAB — CBC WITH DIFFERENTIAL/PLATELET
BASOS PCT: 0 %
Basophils Absolute: 0 10*3/uL (ref 0–0.1)
EOS ABS: 0.1 10*3/uL (ref 0–0.7)
Eosinophils Relative: 2 %
HCT: 34.7 % — ABNORMAL LOW (ref 35.0–47.0)
HEMOGLOBIN: 11.7 g/dL — AB (ref 12.0–16.0)
Lymphocytes Relative: 21 %
Lymphs Abs: 1 10*3/uL (ref 1.0–3.6)
MCH: 30.5 pg (ref 26.0–34.0)
MCHC: 33.8 g/dL (ref 32.0–36.0)
MCV: 90.2 fL (ref 80.0–100.0)
MONOS PCT: 9 %
Monocytes Absolute: 0.4 10*3/uL (ref 0.2–0.9)
NEUTROS PCT: 68 %
Neutro Abs: 3.3 10*3/uL (ref 1.4–6.5)
PLATELETS: 130 10*3/uL — AB (ref 150–440)
RBC: 3.85 MIL/uL (ref 3.80–5.20)
RDW: 13.7 % (ref 11.5–14.5)
WBC: 4.9 10*3/uL (ref 3.6–11.0)

## 2017-03-18 LAB — COMPREHENSIVE METABOLIC PANEL
ALBUMIN: 4.4 g/dL (ref 3.5–5.0)
ALK PHOS: 100 U/L (ref 38–126)
ALT: 25 U/L (ref 14–54)
ANION GAP: 9 (ref 5–15)
AST: 26 U/L (ref 15–41)
BUN: 35 mg/dL — ABNORMAL HIGH (ref 6–20)
CALCIUM: 9.3 mg/dL (ref 8.9–10.3)
CO2: 24 mmol/L (ref 22–32)
Chloride: 104 mmol/L (ref 101–111)
Creatinine, Ser: 1.1 mg/dL — ABNORMAL HIGH (ref 0.44–1.00)
GFR calc non Af Amer: 53 mL/min — ABNORMAL LOW (ref >60)
Glucose, Bld: 136 mg/dL — ABNORMAL HIGH (ref 65–99)
POTASSIUM: 4.1 mmol/L (ref 3.5–5.1)
SODIUM: 137 mmol/L (ref 135–145)
TOTAL PROTEIN: 7.6 g/dL (ref 6.5–8.1)
Total Bilirubin: 0.5 mg/dL (ref 0.3–1.2)

## 2017-03-18 LAB — MAGNESIUM: Magnesium: 1.2 mg/dL — ABNORMAL LOW (ref 1.7–2.4)

## 2017-03-18 MED ORDER — SODIUM CHLORIDE 0.9 % IV SOLN
Freq: Once | INTRAVENOUS | Status: AC
  Administered 2017-03-18: 10:00:00 via INTRAVENOUS
  Filled 2017-03-18: qty 1000

## 2017-03-18 MED ORDER — MAGNESIUM SULFATE 4 GM/100ML IV SOLN
4.0000 g | Freq: Once | INTRAVENOUS | Status: AC
  Administered 2017-03-18: 4 g via INTRAVENOUS
  Filled 2017-03-18: qty 100

## 2017-03-18 NOTE — Progress Notes (Signed)
Patient is here today for a follow up. Patient states no new concerns today.  

## 2017-03-18 NOTE — Assessment & Plan Note (Addendum)
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  HELD maintenance revlimid since July 2018 sec to ONJ [maintenance- 84m 3 w On & 1 week OFF] .   # JUNE 2018- M- protein- NEGATIVE/ K-L=Normal. Skeletal survey- Normal. Hb- 12; WBC- 5.9 platelets- 125. Clinically no evidence of progression at this time. Continue to HOLD Revlimid for now.  # Given severe fatigue/nausea- improved; ? off Revlimid.  Continue close monitoring.  # ONJ- AUG 2018- [place] Given the risk of continued Zometa currently outweighs the benefits- I would recommend discontinuation of Zometa at this time.   # Nausea chronic on Phenergan 25 mg; new script given.   # Chronic diarrhea alternating with constipation-  question Revlimid; improved.  Off colestyramine.   #  Hypomagnesemia- 0.6 mg. Recommend IV mag weekly. Intol PO mag- sec to diarrhea.   # Chronic back pain- pain management follow-up; on fentanyl  25 mc/ [Dr.James; [facility]; [place]]; no BTP.   # weekly magnesium check/IVmag; Follow up with me in 4 weeks; cbc/cmp; myleoma panel/ kappa-lamda light chains.   #

## 2017-03-18 NOTE — Progress Notes (Signed)
[facility] NOTE  Patient Care Team: Glean Hess, MD as PCP - General (Internal Medicine) Cammie Sickle, MD as Consulting Physician (Oncology) Jodell Cipro, MD as Referring Physician (Pain Medicine) Corey Skains, MD as Consulting Physician (Cardiology)  CHIEF COMPLAINTS/PURPOSE OF CONSULTATION:  Multiple myeloma  # MULTIPLE MYELOMA-   Oncology History   # June 2017- [facility] MULTIPLE MYELOMA [BMBx- 80% plasma cells; [facility]]; Lamda light chain 1340; s/p RVD   # 14th DEC 2016- Auto-Stem cell transplant [[facility];Dr.Hertzig]; rev [dose reduced sec to [place]  # Maintenance Revlimid- 49m 3 w-ON & 1 week OFF; HELD July 2018- sec to ONJ  # Bone lesions-numerous ~56mlytic lesions in Thoracic spine- on Zometa  # 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  female of her history of multiple myeloma diagnosed June 2016 currently on Surveillance [ since July 2018 maintenance Revlimid on hold]  Is here for follow-up.  In the interim patient was evaluated [facility] oral surgery for osteonecrosis of the jaw. This is currently thought to be grade 1- had a CT scan that did not show any abscess. Recommend chlorhexidine rinses and no surgical intervention at this time.  Patient fatigue is improved. Diarrhea is also improved. Diarrhea slightly improved.   She continues to have chronic nausea for which she takes Phenergan. Not any worse.  ROS: A complete 10 point review of system is done which is negative except mentioned above in history of present illness  MEDICAL HISTORY:  Past Medical History:  Diagnosis Date  . Anemia   . Anxiety   . Arthritis   . Bicuspid aortic  valve   . CHF (congestive heart failure) ([facility]  . CKD (chronic kidney disease) stage 3, GFR 30-59 ml/min   . Depression   . Diabetes mellitus ([place]  . Dizziness   . Fatty liver   . Frequent falls   . GERD (gastroesophageal reflux disease)   . Gout   . Heart murmur   . History of blood transfusion   . [place] of bone marrow transplant ([place]  . History of uterine fibroid   . Hypertension   . Hypomagnesemia   . Multiple myeloma ([facility]  . Renal cyst     SURGICAL HISTORY: Past Surgical History:  Procedure Laterality Date  . 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 CARDIAC ELECTROPHYSIOLOGY MAPPING AND ABLATION    . CARPAL TUNNEL RELEASE Bilateral   . CHOLECYSTECTOMY  2008  . FOOT SURGERY Bilateral   . INCONTINENCE SURGERY  2009  . PARTIAL HYSTERECTOMY  03/1996   fibroids  . TONSILLECTOMY  2007    [place] quit smoking 93; occasional alcohol; lives [place] with step sister.  Social History   Social History  . Marital status: Widowed    Spouse name: N/A  . Number of children: N/A  . Years of education: N/A   Occupational History  . Disabled    Social History Main Topics  . Smoking status: Former Smoker    Packs/day: 1.00    Years: 20.00    Types: Cigarettes    Quit date: 07/02/1991  . Smokeless  tobacco: Never Used  . Alcohol use 1.2 oz/week    1 Glasses of wine, 1 Cans of beer per week     Comment: occassional  . Drug use: No  . Sexual activity: No   Other Topics Concern  . Not on file   Social History Narrative  . No narrative on file    FAMILY HISTORY: Family History  Problem Relation Age of Onset  . Colon cancer Father   . Renal Disease Father   . Diabetes Mellitus II Father   . Melanoma Paternal Grandmother   . Breast cancer Maternal Aunt   . Anemia Mother   . Heart disease Mother   . Heart failure Mother   . Renal Disease Mother   . Congestive Heart Failure Mother   . Heart disease Maternal  Uncle   . Throat cancer Maternal Uncle   . Lung cancer Maternal Uncle   . Liver disease Maternal Uncle   . Heart failure Maternal Uncle   . Hearing loss Son 18       Suicide     ALLERGIES:  is allergic to oxycodone-acetaminophen; benadryl [diphenhydramine]; morphine; ondansetron; and tylenol [acetaminophen].  MEDICATIONS:  Current Outpatient Prescriptions  Medication Sig Dispense Refill  . acyclovir (ZOVIRAX) 200 MG capsule Take 2 capsules (400 mg total) by mouth 3 (three) times daily. 180 capsule 5  . allopurinol (ZYLOPRIM) 100 MG tablet Take 1 tablet (100 mg total) by mouth daily. 30 tablet 5  . bisoprolol (ZEBETA) 10 MG tablet TAKE 1 TABLET(10 MG) BY MOUTH DAILY 30 tablet 0  . Calcium Carbonate-Vit D-Min (CALCIUM 600+D PLUS MINERALS) 600-400 MG-UNIT TABS Take 1 tablet by mouth daily.     . cholestyramine (QUESTRAN) 4 g packet Take 4 g once daily. 60 each 3  . diphenoxylate-atropine (LOMOTIL) 2.5-0.025 MG tablet Take 2 tablets by mouth daily as needed for diarrhea or loose stools. 30 tablet 2  . DULoxetine (CYMBALTA) 60 MG capsule TAKE 1 CAPSULE(60 MG) BY MOUTH TWICE DAILY 60 capsule 0  . DULoxetine (CYMBALTA) 60 MG capsule TAKE 1 CAPSULE(60 MG) BY MOUTH TWICE DAILY 60 capsule 0  . fentaNYL (DURAGESIC - DOSED MCG/HR) 25 MCG/HR patch Place 25 mcg onto the skin every 3 (three) days.    . fexofenadine (ALLEGRA) 180 MG tablet Take 1 tablet (180 mg total) by mouth daily. 30 tablet 1  . fluticasone (FLONASE) 50 MCG/ACT nasal spray Place 2 sprays into both nostrils daily. 16 g 0  . furosemide (LASIX) 20 MG tablet Take 0.5 tablets (10 mg total) by mouth daily as needed. 30 tablet 0  . hydrOXYzine (ATARAX/VISTARIL) 10 MG tablet TAKE 1 TABLET(10 MG) BY MOUTH THREE TIMES DAILY AS NEEDED 90 tablet 0  . montelukast (SINGULAIR) 10 MG tablet Take 1 tablet (10 mg total) by mouth at bedtime. 30 tablet 5  . Multiple Vitamins-Minerals (HAIR/SKIN/NAILS/BIOTIN PO) Take 1 capsule by mouth 3 (three) times  daily.    Marland Kitchen omeprazole (PRILOSEC) 40 MG capsule Take 1 capsule (40 mg total) by mouth daily. 30 capsule 5  . promethazine (PHENERGAN) 25 MG tablet 1-2 pills prior to meals twice a day as needed 60 tablet 4  . REVLIMID 5 MG capsule Take 1 capsule (5 mg total) by mouth daily. X 21 days.  1 week off 21 capsule 0  . tiZANidine (ZANAFLEX) 4 MG tablet TAKE 1 TABLET BY MOUTH EVERY 8 HOURS AS NEEDED, MUSCLE RELAXER FOR BACK PAIN 90 tablet 0  . zaleplon (SONATA) 10 MG capsule TAKE  1 CAPSULE BY MOUTH EVERY DAY AT BEDTIME AS NEEDED FOR SLEEP 30 capsule 5  . Zoledronic Acid (ZOMETA IV) Inject into the vein every 30 (thirty) days.     No current facility-administered medications for this visit.       Marland Kitchen  PHYSICAL EXAMINATION: ECOG PERFORMANCE STATUS: 1 - Symptomatic but completely ambulatory  Vitals:   03/18/17 0837  BP: 131/80  Pulse: 80  Resp: 16  Temp: 98.1 F (36.7 C)   Filed Weights   03/18/17 0837  Weight: 144 lb 8.2 oz (65.5 kg)    GENERAL: Well-nourished well-developed; Alert, no distress and comfortable.  EYES: no pallor or icterus OROPHARYNX: no thrush; Right lower jaw medial- bone exposed.  NECK: supple, no masses felt LYMPH:  no palpable lymphadenopathy in the cervical, axillary or inguinal regions LUNGS: clear to auscultation and  No wheeze or crackles HEART/CVS: regular rate & rhythm and no murmurs; No lower extremity edema ABDOMEN: abdomen soft, non-tender and normal bowel sounds Musculoskeletal:no cyanosis of digits and no clubbing  PSYCH: alert & oriented x 3 with fluent speech NEURO: no focal motor/sensory deficits SKIN:  no rashes or significant lesions  LABORATORY DATA:  I have reviewed the data as listed Lab Results  Component Value Date   WBC 4.9 03/18/2017   HGB 11.7 (L) 03/18/2017   HCT 34.7 (L) 03/18/2017   MCV 90.2 03/18/2017   PLT 130 (L) 03/18/2017    Recent Labs  12/24/16 0937 02/18/17 0826 03/18/17 0820  NA 139 136 137  K 3.7 4.4 4.1  CL  107 102 104  CO2 21* 23 24  GLUCOSE 124* 156* 136*  BUN 22* 25* 35*  CREATININE 1.16* 1.12* 1.10*  CALCIUM 7.0* 9.9 9.3  GFRNONAA 50* 52* 53*  GFRAA 58* >60 >60  PROT 6.8 7.4 7.6  ALBUMIN 4.1 4.5 4.4  AST 26 24 26   ALT 19 20 25   ALKPHOS 94 103 100  BILITOT 0.5 0.6 0.5    Results for PATRECE, TALLIE (MRN [id number]) as of 01/21/2017 09:32  Ref. Range 08/31/2015 00:00 02/26/2016 00:00 04/02/2016 16:03 04/08/2016 16:48 04/19/2016 16:04 05/07/2016 09:47 05/07/2016 09:52 05/21/2016 16:19 05/21/2016 16:43 05/21/2016 17:34 05/21/2016 18:42 05/22/2016 12:50 06/18/2016 11:15 07/23/2016 08:30 07/24/2016 11:49 08/28/2016 10:32 09/24/2016 10:06 09/24/2016 10:16 10/16/2016 13:28 10/16/2016 15:23 10/16/2016 16:19 10/22/2016 08:35 12/24/2016 09:37  Kappa free light chain Latest Ref Range: 3.3 - 19.4 mg/L   21.3 (H)          17.1    12.5      15.1  Lamda free light chains Latest Ref Range: 5.7 - 26.3 mg/L   15.2          15.8    14.6      19.3  Kappa, lamda light chain ratio Latest Ref Range: 0.26 - 1.65    1.40          1.08    0.86      0.78  M Protein SerPl Elph-Mcnc Latest Ref Range: Not Observed g/dL   Not Observed          Not Observed    Not Observed      Not Observed     RADIOGRAPHIC STUDIES: I have personally reviewed the radiological images as listed and agreed with the findings in the report. No results found.  ASSESSMENT & PLAN:   Multiple myeloma in remission ([place]) Multiple myeloma IgA lambda status post autologous stem cell transplant in December 2016 [[facility]].  HELD maintenance  revlimid since July 2018 sec to ONJ [maintenance- 39m 3 w On & 1 week OFF] .   # JUNE 2018- M- protein- NEGATIVE/ K-L=Normal. Skeletal survey- Normal. Hb- 12; WBC- 5.9 platelets- 125. Clinically no evidence of progression at this time. Continue to HOLD Revlimid for now.  # Given severe fatigue/nausea- improved; ? off Revlimid.  Continue close monitoring.  # ONJ- AUG 2018- I  [place] Given the risk  of continued Zometa currently outweighs the benefits- I would recommend discontinuation of Zometa at this time.   # Nausea chronic on Phenergan 25 mg; new script given.   # Chronic diarrhea alternating with constipation-  question Revlimid; improved.  Off colestyramine.   #  Hypomagnesemia- 0.6 mg. Recommend IV mag weekly. Intol PO mag- sec to diarrhea.   # Chronic back pain- pain management follow-up; on fentanyl  25 mc/ [Dr.James; [facility]; [place]]; no BTP.   # weekly magnesium check/IVmag; Follow up with me in 4 weeks; cbc/cmp; myleoma panel/ kappa-lamda light chains.   #     GCammie Sickle MD 03/18/2017 7:27 PM

## 2017-03-19 LAB — KAPPA/LAMBDA LIGHT CHAINS
KAPPA FREE LGHT CHN: 16.6 mg/L (ref 3.3–19.4)
Kappa, lambda light chain ratio: 0.83 (ref 0.26–1.65)
LAMDA FREE LIGHT CHAINS: 20 mg/L (ref 5.7–26.3)

## 2017-03-20 ENCOUNTER — Telehealth: Payer: Self-pay | Admitting: *Deleted

## 2017-03-20 NOTE — Telephone Encounter (Signed)
-----   Message from Secundino Ginger sent at 03/20/2017 11:33 AM EDT ----- Regarding: iv Mag This pt wants to know if she can skip next week she will be out of town till Thursday afternoon. If not she'll have to come Friday morn but would rather skip it.

## 2017-03-20 NOTE — Telephone Encounter (Signed)
Per md, pt may skip next mag infusion

## 2017-03-24 LAB — MULTIPLE MYELOMA PANEL, SERUM
ALBUMIN SERPL ELPH-MCNC: 3.8 g/dL (ref 2.9–4.4)
ALPHA2 GLOB SERPL ELPH-MCNC: 1 g/dL (ref 0.4–1.0)
Albumin/Glob SerPl: 1.3 (ref 0.7–1.7)
Alpha 1: 0.3 g/dL (ref 0.0–0.4)
B-GLOBULIN SERPL ELPH-MCNC: 1.1 g/dL (ref 0.7–1.3)
GAMMA GLOB SERPL ELPH-MCNC: 0.6 g/dL (ref 0.4–1.8)
GLOBULIN, TOTAL: 3 g/dL (ref 2.2–3.9)
IGG (IMMUNOGLOBIN G), SERUM: 544 mg/dL — AB (ref 700–1600)
IgA: 166 mg/dL (ref 87–352)
IgM (Immunoglobulin M), Srm: 35 mg/dL (ref 26–217)
TOTAL PROTEIN ELP: 6.8 g/dL (ref 6.0–8.5)

## 2017-03-25 ENCOUNTER — Ambulatory Visit: Payer: Managed Care, Other (non HMO)

## 2017-03-25 ENCOUNTER — Other Ambulatory Visit: Payer: Managed Care, Other (non HMO)

## 2017-04-01 ENCOUNTER — Inpatient Hospital Stay: Payer: Managed Care, Other (non HMO) | Attending: Internal Medicine

## 2017-04-01 ENCOUNTER — Inpatient Hospital Stay: Payer: Managed Care, Other (non HMO)

## 2017-04-01 ENCOUNTER — Other Ambulatory Visit: Payer: Self-pay

## 2017-04-01 VITALS — BP 95/59 | HR 65 | Temp 97.0°F | Resp 18

## 2017-04-01 DIAGNOSIS — K760 Fatty (change of) liver, not elsewhere classified: Secondary | ICD-10-CM | POA: Diagnosis not present

## 2017-04-01 DIAGNOSIS — Q231 Congenital insufficiency of aortic valve: Secondary | ICD-10-CM | POA: Diagnosis not present

## 2017-04-01 DIAGNOSIS — F419 Anxiety disorder, unspecified: Secondary | ICD-10-CM | POA: Insufficient documentation

## 2017-04-01 DIAGNOSIS — I509 Heart failure, unspecified: Secondary | ICD-10-CM | POA: Diagnosis not present

## 2017-04-01 DIAGNOSIS — M8788 Other osteonecrosis, other site: Secondary | ICD-10-CM | POA: Insufficient documentation

## 2017-04-01 DIAGNOSIS — Z87442 Personal history of urinary calculi: Secondary | ICD-10-CM | POA: Insufficient documentation

## 2017-04-01 DIAGNOSIS — R197 Diarrhea, unspecified: Secondary | ICD-10-CM | POA: Insufficient documentation

## 2017-04-01 DIAGNOSIS — C9001 Multiple myeloma in remission: Secondary | ICD-10-CM

## 2017-04-01 DIAGNOSIS — R42 Dizziness and giddiness: Secondary | ICD-10-CM | POA: Diagnosis not present

## 2017-04-01 DIAGNOSIS — Z800 Family history of malignant neoplasm of digestive organs: Secondary | ICD-10-CM | POA: Insufficient documentation

## 2017-04-01 DIAGNOSIS — I130 Hypertensive heart and chronic kidney disease with heart failure and stage 1 through stage 4 chronic kidney disease, or unspecified chronic kidney disease: Secondary | ICD-10-CM | POA: Diagnosis not present

## 2017-04-01 DIAGNOSIS — K219 Gastro-esophageal reflux disease without esophagitis: Secondary | ICD-10-CM | POA: Insufficient documentation

## 2017-04-01 DIAGNOSIS — C9000 Multiple myeloma not having achieved remission: Secondary | ICD-10-CM | POA: Insufficient documentation

## 2017-04-01 DIAGNOSIS — Z23 Encounter for immunization: Secondary | ICD-10-CM | POA: Diagnosis not present

## 2017-04-01 DIAGNOSIS — M549 Dorsalgia, unspecified: Secondary | ICD-10-CM | POA: Insufficient documentation

## 2017-04-01 DIAGNOSIS — K5900 Constipation, unspecified: Secondary | ICD-10-CM | POA: Insufficient documentation

## 2017-04-01 DIAGNOSIS — F329 Major depressive disorder, single episode, unspecified: Secondary | ICD-10-CM | POA: Insufficient documentation

## 2017-04-01 DIAGNOSIS — N183 Chronic kidney disease, stage 3 (moderate): Secondary | ICD-10-CM | POA: Diagnosis not present

## 2017-04-01 DIAGNOSIS — R110 Nausea: Secondary | ICD-10-CM | POA: Diagnosis not present

## 2017-04-01 DIAGNOSIS — M109 Gout, unspecified: Secondary | ICD-10-CM | POA: Insufficient documentation

## 2017-04-01 DIAGNOSIS — Z87891 Personal history of nicotine dependence: Secondary | ICD-10-CM | POA: Insufficient documentation

## 2017-04-01 DIAGNOSIS — E538 Deficiency of other specified B group vitamins: Secondary | ICD-10-CM | POA: Insufficient documentation

## 2017-04-01 DIAGNOSIS — E559 Vitamin D deficiency, unspecified: Secondary | ICD-10-CM | POA: Insufficient documentation

## 2017-04-01 DIAGNOSIS — R5383 Other fatigue: Secondary | ICD-10-CM | POA: Diagnosis not present

## 2017-04-01 DIAGNOSIS — Z9484 Stem cells transplant status: Secondary | ICD-10-CM | POA: Insufficient documentation

## 2017-04-01 DIAGNOSIS — Z9481 Bone marrow transplant status: Secondary | ICD-10-CM | POA: Insufficient documentation

## 2017-04-01 DIAGNOSIS — E612 Magnesium deficiency: Secondary | ICD-10-CM

## 2017-04-01 DIAGNOSIS — Z801 Family history of malignant neoplasm of trachea, bronchus and lung: Secondary | ICD-10-CM | POA: Insufficient documentation

## 2017-04-01 DIAGNOSIS — Z79899 Other long term (current) drug therapy: Secondary | ICD-10-CM | POA: Insufficient documentation

## 2017-04-01 DIAGNOSIS — G629 Polyneuropathy, unspecified: Secondary | ICD-10-CM | POA: Insufficient documentation

## 2017-04-01 DIAGNOSIS — Z803 Family history of malignant neoplasm of breast: Secondary | ICD-10-CM | POA: Insufficient documentation

## 2017-04-01 DIAGNOSIS — E119 Type 2 diabetes mellitus without complications: Secondary | ICD-10-CM | POA: Diagnosis not present

## 2017-04-01 LAB — MAGNESIUM: Magnesium: 1.5 mg/dL — ABNORMAL LOW (ref 1.7–2.4)

## 2017-04-01 MED ORDER — MAGNESIUM SULFATE 50 % IJ SOLN: 2.0000 g | Freq: Once | INTRAVENOUS | Status: DC

## 2017-04-01 MED ORDER — MAGNESIUM SULFATE 2 GM/50ML IV SOLN
2.0000 g | Freq: Once | INTRAVENOUS | Status: AC
  Administered 2017-04-01: 2 g via INTRAVENOUS
  Filled 2017-04-01: qty 50

## 2017-04-01 MED ORDER — SODIUM CHLORIDE 0.9 % IV SOLN
Freq: Once | INTRAVENOUS | Status: AC
  Administered 2017-04-01: 09:00:00 via INTRAVENOUS
  Filled 2017-04-01: qty 1000

## 2017-04-01 NOTE — Patient Instructions (Signed)
Magnesium Sulfate injection What is this medicine? MAGNESIUM SULFATE (mag NEE zee um SUL fate) is an electrolyte injection commonly used to treat low magnesium levels in your blood. It is also used to prevent or control seizures in women with preeclampsia or eclampsia. This medicine may be used for other purposes; ask your health care provider or pharmacist if you have questions. What should I tell my health care provider before I take this medicine? They need to know if you have any of these conditions: -heart disease -history of irregular heart beat -kidney disease -an unusual or allergic reaction to magnesium sulfate, medicines, foods, dyes, or preservatives -pregnant or trying to get pregnant -breast-feeding How should I use this medicine? This medicine is for infusion into a vein. It is given by a health care professional in a hospital or clinic setting. Talk to your pediatrician regarding the use of this medicine in children. While this drug may be prescribed for selected conditions, precautions do apply. Overdosage: If you think you have taken too much of this medicine contact a poison control center or emergency room at once. NOTE: This medicine is only for you. Do not share this medicine with others. What if I miss a dose? This does not apply. What may interact with this medicine? This medicine may interact with the following medications: -certain medicines for anxiety or sleep -certain medicines for seizures like phenobarbital -digoxin -medicines that relax muscles for surgery -narcotic medicines for pain This list may not describe all possible interactions. Give your health care provider a list of all the medicines, herbs, non-prescription drugs, or dietary supplements you use. Also tell them if you smoke, drink alcohol, or use illegal drugs. Some items may interact with your medicine. What should I watch for while using this medicine? Your condition will be monitored carefully  while you are receiving this medicine. You may need blood work done while you are receiving this medicine. What side effects may I notice from receiving this medicine? Side effects that you should report to your doctor or health care professional as soon as possible: -allergic reactions like skin rash, itching or hives, swelling of the face, lips, or tongue -facial flushing -muscle weakness -signs and symptoms of low blood pressure like dizziness; feeling faint or lightheaded, falls; unusually weak or tired -signs and symptoms of a dangerous change in heartbeat or heart rhythm like chest pain; dizziness; fast or irregular heartbeat; palpitations; breathing problems -sweating This list may not describe all possible side effects. Call your doctor for medical advice about side effects. You may report side effects to FDA at 1-800-FDA-1088. Where should I keep my medicine? This drug is given in a hospital or clinic and will not be stored at home. NOTE: This sheet is a summary. It may not cover all possible information. If you have questions about this medicine, talk to your doctor, pharmacist, or health care provider.  2018 Elsevier/Gold Standard (2016-01-03 12:31:42)  

## 2017-04-02 ENCOUNTER — Other Ambulatory Visit: Payer: Self-pay | Admitting: Internal Medicine

## 2017-04-03 ENCOUNTER — Telehealth: Payer: Self-pay | Admitting: *Deleted

## 2017-04-03 NOTE — Telephone Encounter (Signed)
-----   Message from Secundino Ginger sent at 04/03/2017  9:45 AM EDT ----- Regarding: jaw bone broke off Contact: [phone] Pt said her jawbone broke 1/2 inch piece off and there is a raw place. The specialty dentist is out of town and can't get her in until the 16th of [place]. She does not have a reg dentist. She wants to know what to do and when she was her getting her mag she kept falling asleep while eating her sandwich. Cyril Mourning thought maybe she took a double dose of some medication. But she did not. She had taken her BP med Cymbalta, stomach med, and muscle relaxer. She did not eat before she came but normally does not. She was concerned that she kept falling asleep. Please give pt a call.

## 2017-04-03 NOTE — Telephone Encounter (Signed)
Spoke with patient - she states that she spoke with RN at Dr. Jules Schick office, who advised her to use "rinse with salt water and peroxide to help sooth the mouth and help prevent infection." pt stated that she didn't want to start an antibiotic. She reports mouth pain. Pt voices concerns about the tooth potentially breaking loose and "fear of swallowing the tooth." She doesn't have any local dentist and may not be able to get into a new dentist office locally without a prolonged waiting period as she is not established.  pt advised to contact Dr. Jules Schick office back- to see the office's protocol on emergency oral surgery coverage in Dr. Jules Schick absence.  Patient informed that she would need to contact her pcp office regarding her symptoms of drowsiness and medication mgmt.

## 2017-04-04 ENCOUNTER — Other Ambulatory Visit: Payer: Self-pay | Admitting: Internal Medicine

## 2017-04-08 ENCOUNTER — Inpatient Hospital Stay: Payer: Managed Care, Other (non HMO)

## 2017-04-08 ENCOUNTER — Other Ambulatory Visit: Payer: Self-pay

## 2017-04-08 VITALS — BP 128/79 | HR 76 | Temp 94.7°F | Resp 18

## 2017-04-08 DIAGNOSIS — C9001 Multiple myeloma in remission: Secondary | ICD-10-CM

## 2017-04-08 DIAGNOSIS — C9000 Multiple myeloma not having achieved remission: Secondary | ICD-10-CM | POA: Diagnosis not present

## 2017-04-08 DIAGNOSIS — E612 Magnesium deficiency: Secondary | ICD-10-CM

## 2017-04-08 LAB — MAGNESIUM: Magnesium: 1.4 mg/dL — ABNORMAL LOW (ref 1.7–2.4)

## 2017-04-08 MED ORDER — MAGNESIUM SULFATE 2 GM/50ML IV SOLN
2.0000 g | Freq: Once | INTRAVENOUS | Status: AC
  Administered 2017-04-08: 2 g via INTRAVENOUS
  Filled 2017-04-08: qty 50

## 2017-04-08 MED ORDER — SODIUM CHLORIDE 0.9 % IV SOLN
Freq: Once | INTRAVENOUS | Status: AC
  Administered 2017-04-08: 10:00:00 via INTRAVENOUS
  Filled 2017-04-08: qty 1000

## 2017-04-08 MED ORDER — INFLUENZA VAC SPLIT QUAD 0.5 ML IM SUSY
0.5000 mL | PREFILLED_SYRINGE | Freq: Once | INTRAMUSCULAR | Status: AC
  Administered 2017-04-08: 0.5 mL via INTRAMUSCULAR
  Filled 2017-04-08: qty 0.5

## 2017-04-08 MED ORDER — MAGNESIUM SULFATE 50 % IJ SOLN: 2.0000 g | Freq: Once | INTRAMUSCULAR | Status: DC

## 2017-04-08 NOTE — Patient Instructions (Signed)
Hypomagnesemia  Hypomagnesemia is a condition in which the level of magnesium in the blood is low. Magnesium is a mineral that is found in many foods. It is used in many different processes in the body. Hypomagnesemia can affect every organ in the body. It can cause life-threatening problems.  What are the causes?  Causes of hypomagnesemia include:   Not getting enough magnesium in your diet.   Malnutrition.   Problems with absorbing magnesium from the intestines.   Dehydration.   Alcohol abuse.   Vomiting.   Severe diarrhea.   Some medicines, including medicines that make you urinate more.   Certain diseases, such as kidney disease, diabetes, and overactive thyroid.    What are the signs or symptoms?   Involuntary shaking or trembling of a body part (tremor).   Confusion.   Muscle weakness.   Sensitivity to light, sound, and touch.   Psychiatric issues, such as depression, irritability, or psychosis.   Sudden tightening of muscles (muscle spasms).   Tingling in the arms and legs.   A feeling of fluttering of the heart.  These symptoms are more severe if magnesium levels drop suddenly.  How is this diagnosed?  To make a diagnosis, your health care provider will do a physical exam and order blood and urine tests.  How is this treated?  Treatment will depend on the cause and the severity of your condition. It may involve:   A magnesium supplement. This can be taken in pill form. It can also be given through an IV tube. This is usually done if the condition is severe.   Changes to your diet. You may be directed to eat foods that have a lot of magnesium, such as green leafy vegetables, peas, beans, and nuts.   Eliminating alcohol from your diet.    Follow these instructions at home:   Include foods with magnesium in your diet. Foods that are rich in magnesium include green vegetables, beans, nuts and seeds, and whole grains.   Take medicines only as directed by your health care provider.   Take  magnesium supplements if your health care provider instructs you to do that. Take them as directed.   Have your magnesium levels monitored as directed by your health care provider.   When you are active, drink fluids that contain electrolytes.   Keep all follow-up visits as directed by your health care provider. This is important.  Contact a health care provider if:   You get worse instead of better.   Your symptoms return.  Get help right away if:   Your symptoms are severe.  This information is not intended to replace advice given to you by your health care provider. Make sure you discuss any questions you have with your health care provider.  Document Released: 03/13/2005 Document Revised: 11/23/2015 Document Reviewed: 01/31/2014  Elsevier Interactive Patient Education  2018 [employer]vier [employer]

## 2017-04-09 ENCOUNTER — Encounter: Payer: Self-pay | Admitting: Internal Medicine

## 2017-04-09 ENCOUNTER — Ambulatory Visit (INDEPENDENT_AMBULATORY_CARE_PROVIDER_SITE_OTHER): Payer: Managed Care, Other (non HMO) | Admitting: Internal Medicine

## 2017-04-09 VITALS — BP 122/78 | HR 93 | Ht 63.0 in | Wt 147.0 lb

## 2017-04-09 DIAGNOSIS — E559 Vitamin D deficiency, unspecified: Secondary | ICD-10-CM

## 2017-04-09 DIAGNOSIS — K219 Gastro-esophageal reflux disease without esophagitis: Secondary | ICD-10-CM | POA: Diagnosis not present

## 2017-04-09 DIAGNOSIS — C9001 Multiple myeloma in remission: Secondary | ICD-10-CM | POA: Diagnosis not present

## 2017-04-09 DIAGNOSIS — N182 Chronic kidney disease, stage 2 (mild): Secondary | ICD-10-CM

## 2017-04-09 DIAGNOSIS — F418 Other specified anxiety disorders: Secondary | ICD-10-CM

## 2017-04-09 DIAGNOSIS — E1122 Type 2 diabetes mellitus with diabetic chronic kidney disease: Secondary | ICD-10-CM | POA: Diagnosis not present

## 2017-04-09 DIAGNOSIS — R197 Diarrhea, unspecified: Secondary | ICD-10-CM | POA: Diagnosis not present

## 2017-04-09 DIAGNOSIS — M48062 Spinal stenosis, lumbar region with neurogenic claudication: Secondary | ICD-10-CM

## 2017-04-09 MED ORDER — OMEPRAZOLE 40 MG PO CPDR: 40.0000 mg | DELAYED_RELEASE_CAPSULE | Freq: Two times a day (BID) | ORAL | 5 refills | Status: DC

## 2017-04-09 NOTE — Progress Notes (Signed)
Date:  04/09/2017   Name:  Sarah [name]   DOB:  03-09-1956   MRN:  [id number]   Chief Complaint: Nausea (Constantly feeling sick. Unsure if cancer is cause. Taking phenegren when needed. Tried patches behing ears. ) and Diabetes (Wants A1C check. - Sugar was running around 150's in the cancer center last month and wants A1C checked. )  Diabetes  She presents for her follow-up diabetic visit. She has type 2 diabetes mellitus. Her disease course has been fluctuating. Pertinent negatives for hypoglycemia include no dizziness, headaches or sweats. Pertinent negatives for diabetes include no chest pain. Symptoms are stable. Diabetic complications include nephropathy.  Depression         This is a chronic problem.  The problem occurs intermittently.The problem is unchanged.  Associated symptoms include no headaches and no suicidal ideas.  Past treatments include SNRIs - Serotonin and norepinephrine reuptake inhibitors.  Compliance with treatment is good.  Previous treatment provided significant relief. Gastroesophageal Reflux  She complains of abdominal pain, nausea and water brash. She reports no chest pain. This is a chronic problem. The current episode started more than 1 year ago. The problem occurs frequently. The symptoms are aggravated by ETOH. She has tried a PPI (and phenergan for nausea) for the symptoms. The treatment provided mild relief.  Diarrhea   This is a chronic problem. The problem occurs 5 to 10 times per day. The problem has been waxing and waning. The stool consistency is described as watery. Associated symptoms include abdominal pain. Pertinent negatives include no chills, fever, headaches, sweats or vomiting. Exacerbated by: eating,  History EGD and dilatation.  Has HH and has had stomach biopsy in the past - benign. Also has hx gastric ulcers -3 at the same time about 2009.  She has been taking more Advil for her back pain recently.  Lab Results  Component Value Date   HGBA1C 5.1 04/19/2016    Review of Systems  Constitutional: Positive for unexpected weight change. Negative for chills and fever.  Respiratory: Negative for chest tightness and shortness of breath.   Cardiovascular: Negative for chest pain and palpitations.  Gastrointestinal: Positive for abdominal pain, diarrhea and nausea. Negative for vomiting.  Genitourinary: Negative for dysuria.  Neurological: Negative for dizziness and headaches.  Psychiatric/Behavioral: Positive for depression and dysphoric mood. Negative for agitation, sleep disturbance and suicidal ideas.    Patient Active Problem List   Diagnosis Date Noted  . Magnesium deficiency 04/08/2017  . Sinus congestion 05/30/2016  . Environmental and seasonal allergies 04/19/2016  . Insomnia 04/19/2016  . Bicuspid aortic valve 04/19/2016  . Asthma 04/19/2016  . Aortic regurgitation 04/19/2016  . History of CHF (congestive heart failure) 04/19/2016  . DM (diabetes mellitus), type 2 with renal complications ([place]) 94/80/1655  . Anemia 04/12/2016  . GERD (gastroesophageal reflux disease) 04/12/2016  . Depression with anxiety 04/12/2016  . Irritable bowel syndrome (IBS) 04/12/2016  . Hepatic steatosis 04/12/2016  . Multiple myeloma in remission ([place]) 04/02/2016  . Abnormal stress test 02/14/2016  . 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[place]) 07/06/2015  . Disequilibrium 01/14/2013    Prior to Admission medications   Medication Sig Start Date End Date Taking? Authorizing Provider  acyclovir (ZOVIRAX) 200 MG capsule Take 2 capsules (400 mg total) by mouth 3 (three) times daily. 05/30/16  Yes Glean Hess, MD  allopurinol (ZYLOPRIM) 100 MG tablet TAKE 1 TABLET(100 MG) BY MOUTH DAILY 04/02/17  Yes Glean Hess, MD  bisoprolol (ZEBETA) 10  MG tablet TAKE 1 TABLET(10 MG) BY MOUTH DAILY 04/02/17  Yes Glean Hess, MD  Calcium Carbonate-Vit D-Min (CALCIUM 600+D PLUS MINERALS) 600-400 MG-UNIT TABS Take 1 tablet by mouth  daily.    Yes [provider]  cholestyramine (QUESTRAN) 4 g packet Take 4 g once daily. 06/18/16  Yes Cammie Sickle, MD  diphenoxylate-atropine (LOMOTIL) 2.5-0.025 MG tablet Take 2 tablets by mouth daily as needed for diarrhea or loose stools. 09/24/16  Yes Cammie Sickle, MD  DULoxetine (CYMBALTA) 60 MG capsule TAKE 1 CAPSULE(60 MG) BY MOUTH TWICE DAILY 04/02/17  Yes Glean Hess, MD  fentaNYL (DURAGESIC - DOSED MCG/HR) 25 MCG/HR patch Place 25 mcg onto the skin every 3 (three) days. 08/12/16  Yes [provider]  fexofenadine (ALLEGRA) 180 MG tablet Take 1 tablet (180 mg total) by mouth daily. 05/30/16  Yes Glean Hess, MD  fluticasone [facility]) 50 MCG/ACT nasal spray Place 2 sprays into both nostrils daily. 05/30/16  Yes Glean Hess, MD  furosemide (LASIX) 20 MG tablet Take 0.5 tablets (10 mg total) by mouth daily as needed. 05/30/16  Yes Glean Hess, MD  hydrOXYzine (ATARAX/VISTARIL) 10 MG tablet TAKE 1 TABLET(10 MG) BY MOUTH THREE TIMES DAILY AS NEEDED 12/11/16  Yes Glean Hess, MD  montelukast (SINGULAIR) 10 MG tablet Take 1 tablet (10 mg total) by mouth at bedtime. 05/30/16  Yes Glean Hess, MD  Multiple Vitamins-Minerals (HAIR/SKIN/NAILS/BIOTIN PO) Take 1 capsule by mouth 3 (three) times daily.   Yes [provider]  omeprazole (PRILOSEC) 40 MG capsule TAKE 1 CAPSULE(40 MG) BY MOUTH DAILY 04/02/17  Yes Glean Hess, MD  promethazine (PHENERGAN) 25 MG tablet 1-2 pills prior to meals twice a day as needed 12/24/16  Yes Brahmanday, Elisha [place]land, MD  tiZANidine (ZANAFLEX) 4 MG tablet TAKE 1 TABLET BY MOUTH EVERY 8 HOURS AS NEEDED, MUSCLE RELAXER FOR BACK PAIN 04/04/17  Yes Glean Hess, MD  zaleplon (SONATA) 10 MG capsule TAKE 1 CAPSULE BY MOUTH EVERY DAY AT BEDTIME AS NEEDED FOR SLEEP 02/07/17  Yes Glean Hess, MD  REVLIMID 5 MG capsule Take 1 capsule (5 mg total) by mouth daily. X 21 days.  1 week off Patient not  taking: Reported on 04/09/2017 12/19/16   Lequita Asal, MD  Zoledronic Acid (ZOMETA IV) Inject into the vein every 30 (thirty) days.    [provider]    Allergies  Allergen Reactions  . Oxycodone-Acetaminophen   . Benadryl [Diphenhydramine] Palpitations  . Morphine Itching and Rash  . Ondansetron Diarrhea    CAUSES PATIENT DIARRHEA CAUSES PATIENT DIARRHEA  . Tylenol [Acetaminophen] Itching and Rash    Past Surgical History:  Procedure Laterality Date  . 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 CARDIAC ELECTROPHYSIOLOGY MAPPING AND ABLATION    . CARPAL TUNNEL RELEASE Bilateral   . CHOLECYSTECTOMY  2008  . FOOT SURGERY Bilateral   . INCONTINENCE SURGERY  2009  . PARTIAL HYSTERECTOMY  03/1996   fibroids  . TONSILLECTOMY  2007    Social History  Substance Use Topics  . Smoking status: Former Smoker    Packs/day: 1.00    Years: 20.00    Types: Cigarettes    Quit date: 07/02/1991  . Smokeless tobacco: Never Used  . Alcohol use 1.2 oz/week    1 Glasses of wine, 1 Cans of beer per week     Comment: occassional     Medication list has been reviewed and  updated.  PHQ 2/9 Scores 04/09/2017 09/02/2016  PHQ - 2 Score 1 1  PHQ- 9 Score 5 7    Physical Exam  Constitutional: She appears well-developed and well-nourished. No distress.  Neck: Normal range of motion. No thyromegaly present.  Cardiovascular: Normal rate, regular rhythm and normal heart sounds.   Pulmonary/Chest: Effort normal and breath sounds normal. No respiratory distress. She has no wheezes.  Abdominal: Soft. Bowel sounds are normal. She exhibits no distension. There is no hepatosplenomegaly. There is generalized tenderness. There is no rigidity, no rebound and no guarding.  Neurological: She is alert.  Psychiatric: She has a normal mood and affect. Her speech is normal.    BP 122/78 (BP Location: Right Arm, Patient Position: Sitting, Cuff Size: Normal)   Pulse 93   Ht _0  (1.6 m)   Wt 147 lb  (66.7 kg)   SpO2 98%   BMI 26.04 kg/m   Assessment and Plan: 1. Type 2 diabetes mellitus with stage 2 chronic kidney disease, without long-term current use of insulin ([place]) Will advise on medication - Lipid panel - Hemoglobin A1c  2. Multiple myeloma in remission ([place]) Followed by Oncology Revlamid stopped  3. Depression with anxiety Continue Cymbalta  4. Gastroesophageal reflux disease without esophagitis May be recurrent ulcer since taking nsaids Double omeprazole; refer to GI - omeprazole (PRILOSEC) 40 MG capsule; Take 1 capsule (40 mg total) by mouth 2 (two) times daily.  Dispense: 60 capsule; Refill: 5 - Ambulatory referral to Gastroenterology  5. Vitamin D deficiency Check labs - VITAMIN D 25 Hydroxy (Vit-D Deficiency, Fractures)  6. Diarrhea, unspecified type Check labs - Vitamin B12  7. Spinal stenosis of lumbar region with neurogenic claudication Followed by Pain Mgmt On Fentanyl patch   Meds ordered this encounter  Medications  . omeprazole (PRILOSEC) 40 MG capsule    Sig: Take 1 capsule (40 mg total) by mouth 2 (two) times daily.    Dispense:  60 capsule    Refill:  5    Partially dictated using Editor, commissioning. Any errors are unintentional.  Halina Maidens, MD [facility]  04/09/2017

## 2017-04-09 NOTE — Patient Instructions (Signed)
Try 1/2 pack of cholestyramine daily for diarrhea.  Double omeprazole to twice a day

## 2017-04-10 LAB — LIPID PANEL
CHOLESTEROL TOTAL: 178 mg/dL (ref 100–199)
Chol/HDL Ratio: 1.9 ratio (ref 0.0–4.4)
HDL: 96 mg/dL (ref >39)
LDL CALC: 66 mg/dL (ref 0–99)
TRIGLYCERIDES: 79 mg/dL (ref 0–149)
VLDL CHOLESTEROL CAL: 16 mg/dL (ref 5–40)

## 2017-04-10 LAB — HEMOGLOBIN A1C
ESTIMATED AVERAGE GLUCOSE: 128 mg/dL
HEMOGLOBIN A1C: 6.1 % — AB (ref 4.8–5.6)

## 2017-04-10 LAB — VITAMIN B12: Vitamin B-12: 254 pg/mL (ref 232–1245)

## 2017-04-10 LAB — VITAMIN D 25 HYDROXY (VIT D DEFICIENCY, FRACTURES): Vit D, 25-Hydroxy: 29.8 ng/mL — ABNORMAL LOW (ref 30.0–100.0)

## 2017-04-15 ENCOUNTER — Other Ambulatory Visit: Payer: Self-pay | Admitting: *Deleted

## 2017-04-15 ENCOUNTER — Other Ambulatory Visit: Payer: Self-pay

## 2017-04-15 ENCOUNTER — Inpatient Hospital Stay: Payer: Managed Care, Other (non HMO)

## 2017-04-15 ENCOUNTER — Inpatient Hospital Stay (HOSPITAL_BASED_OUTPATIENT_CLINIC_OR_DEPARTMENT_OTHER): Payer: Managed Care, Other (non HMO) | Admitting: Internal Medicine

## 2017-04-15 VITALS — BP 127/76 | HR 67 | Temp 97.9°F | Resp 16 | Wt 150.4 lb

## 2017-04-15 DIAGNOSIS — I130 Hypertensive heart and chronic kidney disease with heart failure and stage 1 through stage 4 chronic kidney disease, or unspecified chronic kidney disease: Secondary | ICD-10-CM

## 2017-04-15 DIAGNOSIS — R197 Diarrhea, unspecified: Secondary | ICD-10-CM

## 2017-04-15 DIAGNOSIS — K219 Gastro-esophageal reflux disease without esophagitis: Secondary | ICD-10-CM

## 2017-04-15 DIAGNOSIS — Q231 Congenital insufficiency of aortic valve: Secondary | ICD-10-CM

## 2017-04-15 DIAGNOSIS — K760 Fatty (change of) liver, not elsewhere classified: Secondary | ICD-10-CM

## 2017-04-15 DIAGNOSIS — G629 Polyneuropathy, unspecified: Secondary | ICD-10-CM

## 2017-04-15 DIAGNOSIS — Z87442 Personal history of urinary calculi: Secondary | ICD-10-CM

## 2017-04-15 DIAGNOSIS — F419 Anxiety disorder, unspecified: Secondary | ICD-10-CM | POA: Diagnosis not present

## 2017-04-15 DIAGNOSIS — C9000 Multiple myeloma not having achieved remission: Secondary | ICD-10-CM

## 2017-04-15 DIAGNOSIS — Z9481 Bone marrow transplant status: Secondary | ICD-10-CM

## 2017-04-15 DIAGNOSIS — Z803 Family history of malignant neoplasm of breast: Secondary | ICD-10-CM

## 2017-04-15 DIAGNOSIS — R42 Dizziness and giddiness: Secondary | ICD-10-CM

## 2017-04-15 DIAGNOSIS — M549 Dorsalgia, unspecified: Secondary | ICD-10-CM

## 2017-04-15 DIAGNOSIS — Z800 Family history of malignant neoplasm of digestive organs: Secondary | ICD-10-CM

## 2017-04-15 DIAGNOSIS — Z23 Encounter for immunization: Secondary | ICD-10-CM

## 2017-04-15 DIAGNOSIS — F329 Major depressive disorder, single episode, unspecified: Secondary | ICD-10-CM

## 2017-04-15 DIAGNOSIS — Z79899 Other long term (current) drug therapy: Secondary | ICD-10-CM

## 2017-04-15 DIAGNOSIS — M109 Gout, unspecified: Secondary | ICD-10-CM

## 2017-04-15 DIAGNOSIS — R5383 Other fatigue: Secondary | ICD-10-CM

## 2017-04-15 DIAGNOSIS — M8788 Other osteonecrosis, other site: Secondary | ICD-10-CM | POA: Diagnosis not present

## 2017-04-15 DIAGNOSIS — C9001 Multiple myeloma in remission: Secondary | ICD-10-CM

## 2017-04-15 DIAGNOSIS — Z801 Family history of malignant neoplasm of trachea, bronchus and lung: Secondary | ICD-10-CM

## 2017-04-15 DIAGNOSIS — K5900 Constipation, unspecified: Secondary | ICD-10-CM

## 2017-04-15 DIAGNOSIS — Z9484 Stem cells transplant status: Secondary | ICD-10-CM

## 2017-04-15 DIAGNOSIS — E559 Vitamin D deficiency, unspecified: Secondary | ICD-10-CM

## 2017-04-15 DIAGNOSIS — I509 Heart failure, unspecified: Secondary | ICD-10-CM

## 2017-04-15 DIAGNOSIS — R110 Nausea: Secondary | ICD-10-CM

## 2017-04-15 DIAGNOSIS — D509 Iron deficiency anemia, unspecified: Secondary | ICD-10-CM

## 2017-04-15 DIAGNOSIS — Z87891 Personal history of nicotine dependence: Secondary | ICD-10-CM

## 2017-04-15 DIAGNOSIS — N183 Chronic kidney disease, stage 3 (moderate): Secondary | ICD-10-CM

## 2017-04-15 DIAGNOSIS — E538 Deficiency of other specified B group vitamins: Secondary | ICD-10-CM

## 2017-04-15 DIAGNOSIS — E119 Type 2 diabetes mellitus without complications: Secondary | ICD-10-CM

## 2017-04-15 LAB — CBC WITH DIFFERENTIAL/PLATELET
BASOS ABS: 0 10*3/uL (ref 0–0.1)
BASOS PCT: 0 %
Eosinophils Absolute: 0.1 10*3/uL (ref 0–0.7)
Eosinophils Relative: 1 %
HEMATOCRIT: 32 % — AB (ref 35.0–47.0)
Hemoglobin: 11 g/dL — ABNORMAL LOW (ref 12.0–16.0)
Lymphocytes Relative: 18 %
Lymphs Abs: 0.9 10*3/uL — ABNORMAL LOW (ref 1.0–3.6)
MCH: 30.2 pg (ref 26.0–34.0)
MCHC: 34.3 g/dL (ref 32.0–36.0)
MCV: 88.2 fL (ref 80.0–100.0)
MONO ABS: 0.4 10*3/uL (ref 0.2–0.9)
Monocytes Relative: 9 %
NEUTROS ABS: 3.7 10*3/uL (ref 1.4–6.5)
Neutrophils Relative %: 72 %
PLATELETS: 111 10*3/uL — AB (ref 150–440)
RBC: 3.63 MIL/uL — AB (ref 3.80–5.20)
RDW: 13.7 % (ref 11.5–14.5)
WBC: 5.1 10*3/uL (ref 3.6–11.0)

## 2017-04-15 LAB — BASIC METABOLIC PANEL
ANION GAP: 8 (ref 5–15)
BUN: 31 mg/dL — ABNORMAL HIGH (ref 6–20)
CALCIUM: 8.9 mg/dL (ref 8.9–10.3)
CO2: 24 mmol/L (ref 22–32)
Chloride: 105 mmol/L (ref 101–111)
Creatinine, Ser: 1.18 mg/dL — ABNORMAL HIGH (ref 0.44–1.00)
GFR, EST AFRICAN AMERICAN: 57 mL/min — AB (ref >60)
GFR, EST NON AFRICAN AMERICAN: 49 mL/min — AB (ref >60)
Glucose, Bld: 103 mg/dL — ABNORMAL HIGH (ref 65–99)
POTASSIUM: 4.4 mmol/L (ref 3.5–5.1)
Sodium: 137 mmol/L (ref 135–145)

## 2017-04-15 LAB — MAGNESIUM: MAGNESIUM: 1.5 mg/dL — AB (ref 1.7–2.4)

## 2017-04-15 LAB — IRON AND TIBC
IRON: 55 ug/dL (ref 28–170)
SATURATION RATIOS: 15 % (ref 10.4–31.8)
TIBC: 372 ug/dL (ref 250–450)
UIBC: 317 ug/dL

## 2017-04-15 LAB — FERRITIN: FERRITIN: 26 ng/mL (ref 11–307)

## 2017-04-15 MED ORDER — MAGNESIUM SULFATE 2 GM/50ML IV SOLN
2.0000 g | Freq: Once | INTRAVENOUS | Status: AC
  Administered 2017-04-15: 2 g via INTRAVENOUS
  Filled 2017-04-15: qty 50

## 2017-04-15 MED ORDER — SODIUM CHLORIDE 0.9 % IV SOLN
Freq: Once | INTRAVENOUS | Status: AC
  Administered 2017-04-15: 11:00:00 via INTRAVENOUS
  Filled 2017-04-15: qty 1000

## 2017-04-15 MED ORDER — MAGNESIUM SULFATE 50 % IJ SOLN: 2.0000 g | Freq: Once | INTRAVENOUS | Status: DC

## 2017-04-15 NOTE — Assessment & Plan Note (Addendum)
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  HELD maintenance revlimid since July 2018 sec to ONJ [maintenance- 78m 3 w On & 1 week OFF] .   # JUNE 2018- M- protein- NEGATIVE/ K-L=Normal. Skeletal survey- Normal. Hb- 11; WBC- 5.1 platelets- 121. Clinically no evidence of progression at this time. Continue to HOLD Revlimid for now.  # Given severe fatigue/nausea- improved; ? off Revlimid. Will check iron studies today given patient fatigue and anemia.   # ONJ- AUG 2018- [place] Given the risk of continued Zometa currently outweighs the benefits- I would recommend discontinuation of Zometa at this time. Patient takes calcium and vitamin d currently.   # Nausea chronic on Phenergan 25 mg prn.   # Chronic diarrhea alternating with constipation-  Off Revlimid and symptoms have improved but not resolved. Colestyramine prn per GI according to pt.    #  Hypomagnesemia- Mg 1.5 today. IV mag today and continue with weekly lab monitoring and replenishment as needed. Intolerance to oral magnesium as it worsened her diarrhea.    # Chronic back pain- pain management follow-up; on fentanyl  25 mc/ [Dr.James; [facility]; [place]]; no BTP.   # Magnesium today. Weekly magnesium checks with IV magnesium with levels are low. In 4 weeks return for labs (CBC, MM panel, kappa-lamda light chains, Mg, and Cmet). In 5 weeks follow-up with MD.

## 2017-04-15 NOTE — Progress Notes (Signed)
[facility] NOTE  Patient Care Team: Glean Hess, MD as PCP - General (Internal Medicine) Cammie Sickle, MD as Consulting Physician (Oncology) Jodell Cipro, MD as Referring Physician (Pain Medicine) Corey Skains, MD as Consulting Physician (Cardiology)  CHIEF COMPLAINTS/PURPOSE OF CONSULTATION:  Multiple myeloma  # MULTIPLE MYELOMA-   Oncology History   # June 2017- [facility] MULTIPLE MYELOMA [BMBx- 80% plasma cells; [facility]]; Lamda light chain 1340; s/p RVD   # 14th DEC 2016- Auto-Stem cell transplant [[facility];Dr.Hertzig]; rev [dose reduced sec to [place]  # Maintenance Revlimid- 27m 3 w-ON & 1 week OFF; HELD July 2018- sec to ONJ  # Bone lesions-numerous ~566mlytic lesions in Thoracic spine- on Zometa  # 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  female of her history of multiple myeloma diagnosed June 2016 currently on Surveillance [ since July 2018 maintenance Revlimid on hold]  Is here for follow-up.  Patient consulted [facility] oral surgery for medication related osteonecrosis of the jaw which was though to be grade 1 without evidence of abscess on CT. She continues chlorhexidine rinses and is scheduled to see dentistry for possible extraction of affected tooth today. She reports chills but no documented fevers. She Revlimid has been on hold in setting of MRONJ.   She continues to be fatigued and 'run down'. She fluctuates between episodes of diarrhea, constipation, and has chronic nausea. She reports that she has scheduled a GI consult for further evaluation of these symptoms as her Multiple Myeloma is currently controlled. She has previously been  unable to tolerate oral magnesium supplementation. She reports that she was recently diagnosed with low vitamin d and b12 levels and has started supplementation by PCP.   ROS: A complete 10 point review of system is done which is negative except mentioned above in history of present illness  MEDICAL HISTORY:  Past Medical History:  Diagnosis Date  . Anemia   . Anxiety   . Arthritis   . Bicuspid aortic valve   . CHF (congestive heart failure) ([facility]  . CKD (chronic kidney disease) stage 3, GFR 30-59 ml/min ([facility])   . Depression   . Diabetes mellitus ([facility]  . Dizziness   . Fatty liver   . Frequent falls   . GERD (gastroesophageal reflux disease)   . Gout   . Heart murmur   . History of blood transfusion   . [place] of bone marrow transplant ([facility]  . History of uterine fibroid   . Hypertension   . Hypomagnesemia   . Multiple myeloma ([facility]  . Renal cyst     SURGICAL HISTORY: Past Surgical History:  Procedure Laterality Date  . 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 CARDIAC ELECTROPHYSIOLOGY MAPPING AND ABLATION    . CARPAL TUNNEL RELEASE Bilateral   . CHOLECYSTECTOMY  2008  . FOOT SURGERY Bilateral   . INCONTINENCE SURGERY  2009  . PARTIAL HYSTERECTOMY  03/1996   fibroids  . TONSILLECTOMY  2007   [place] quit smoking [place]; occasional alcohol; lives [place] with step sister.  Social History   Social History  . Marital status:  Widowed    Spouse name: N/A  . Number of children: N/A  . Years of education: N/A   Occupational History  . Disabled    Social History Main Topics  . Smoking status: Former Smoker    Packs/day: 1.00    Years: 20.00    Types: Cigarettes    Quit date: 07/02/1991  . Smokeless tobacco: Never Used  . Alcohol use 1.2 oz/week    1 Glasses of wine, 1 Cans of beer per week     Comment: occassional  . Drug use: No  . Sexual activity: No   Other Topics Concern  . Not on file   Social History  Narrative  . No narrative on file    FAMILY HISTORY: Family History  Problem Relation Age of Onset  . Colon cancer Father   . Renal Disease Father   . Diabetes Mellitus II Father   . Melanoma Paternal Grandmother   . Breast cancer Maternal Aunt   . Anemia Mother   . Heart disease Mother   . Heart failure Mother   . Renal Disease Mother   . Congestive Heart Failure Mother   . Heart disease Maternal Uncle   . Throat cancer Maternal Uncle   . Lung cancer Maternal Uncle   . Liver disease Maternal Uncle   . Heart failure Maternal Uncle   . Hearing loss Son 18       Suicide     ALLERGIES:  is allergic to oxycodone-acetaminophen; benadryl [diphenhydramine]; morphine; ondansetron; and tylenol [acetaminophen].  MEDICATIONS:  Current Outpatient Prescriptions  Medication Sig Dispense Refill  . acyclovir (ZOVIRAX) 200 MG capsule Take 2 capsules (400 mg total) by mouth 3 (three) times daily. 180 capsule 5  . allopurinol (ZYLOPRIM) 100 MG tablet TAKE 1 TABLET(100 MG) BY MOUTH DAILY 30 tablet 0  . bisoprolol (ZEBETA) 10 MG tablet TAKE 1 TABLET(10 MG) BY MOUTH DAILY 30 tablet 0  . Calcium Carbonate-Vit D-Min (CALCIUM 600+D PLUS MINERALS) 600-400 MG-UNIT TABS Take 1 tablet by mouth daily.     . cholestyramine (QUESTRAN) 4 g packet Take 4 g once daily. 60 each 3  . diphenoxylate-atropine (LOMOTIL) 2.5-0.025 MG tablet Take 2 tablets by mouth daily as needed for diarrhea or loose stools. 30 tablet 2  . DULoxetine (CYMBALTA) 60 MG capsule TAKE 1 CAPSULE(60 MG) BY MOUTH TWICE DAILY 60 capsule 0  . fentaNYL (DURAGESIC - DOSED MCG/HR) 25 MCG/HR patch Place 25 mcg onto the skin every 3 (three) days.    . fexofenadine (ALLEGRA) 180 MG tablet Take 1 tablet (180 mg total) by mouth daily. 30 tablet 1  . fluticasone (FLONASE) 50 MCG/ACT nasal spray Place 2 sprays into both nostrils daily. 16 g 0  . furosemide (LASIX) 20 MG tablet Take 0.5 tablets (10 mg total) by mouth daily as needed. 30 tablet 0  .  hydrOXYzine (ATARAX/VISTARIL) 10 MG tablet TAKE 1 TABLET(10 MG) BY MOUTH THREE TIMES DAILY AS NEEDED 90 tablet 0  . montelukast (SINGULAIR) 10 MG tablet Take 1 tablet (10 mg total) by mouth at bedtime. 30 tablet 5  . Multiple Vitamins-Minerals (HAIR/SKIN/NAILS/BIOTIN PO) Take 1 capsule by mouth 3 (three) times daily.    [place] Kitchen omeprazole (PRILOSEC) 40 MG capsule Take 1 capsule (40 mg total) by mouth 2 (two) times daily. 60 capsule 5  . promethazine (PHENERGAN) 25 MG tablet 1-2 pills prior to meals twice a day as needed 60 tablet 4  . tiZANidine (ZANAFLEX) 4 MG tablet TAKE 1 TABLET BY MOUTH  EVERY 8 HOURS AS NEEDED, MUSCLE RELAXER FOR BACK PAIN 90 tablet 0  . zaleplon (SONATA) 10 MG capsule TAKE 1 CAPSULE BY MOUTH EVERY DAY AT BEDTIME AS NEEDED FOR SLEEP 30 capsule 5   No current facility-administered medications for this visit.     PHYSICAL EXAMINATION: ECOG PERFORMANCE STATUS: 1 - Symptomatic but completely ambulatory  Vitals:   04/15/17 0849  BP: 127/76  Pulse: 67  Resp: 16  Temp: 97.9 F (36.6 C)   Filed Weights   04/15/17 0849  Weight: 150 lb 5.7 oz (68.2 kg)    GENERAL: Well-nourished well-developed; Alert, no distress and comfortable.  EYES: no pallor or icterus OROPHARYNX: no thrush; Right lower jaw medial atrophied. No obvious bone exposed. Gum line receeding. Missing teeth. Right back molar loose.  NECK: supple, no masses felt LYMPH:  no palpable lymphadenopathy in the cervical, axillary or inguinal regions LUNGS: clear to auscultation and  No wheeze or crackles HEART/CVS: regular rate & rhythm and no murmurs; No lower extremity edema ABDOMEN: abdomen soft, non-tender and normal bowel sounds Musculoskeletal:no cyanosis of digits and no clubbing  PSYCH: alert & oriented x 3 with fluent speech NEURO: no focal motor/sensory deficits SKIN:  no rashes or significant lesions  LABORATORY DATA:  I have reviewed the data as listed Lab Results  Component Value Date   WBC 5.1  04/15/2017   HGB 11.0 (L) 04/15/2017   HCT 32.0 (L) 04/15/2017   MCV 88.2 04/15/2017   PLT 111 (L) 04/15/2017    Recent Labs  12/24/16 0937 02/18/17 0826 03/18/17 0820 04/15/17 0819  NA 139 136 137 137  K 3.7 4.4 4.1 4.4  CL 107 102 104 105  CO2 21* _0 GLUCOSE 124* 156* 136* 103*  BUN 22* 25* 35* 31*  CREATININE 1.16* 1.12* 1.10* 1.18*  CALCIUM 7.0* 9.9 9.3 8.9  GFRNONAA 50* 52* 53* 49*  GFRAA 58* >60 >60 57*  PROT 6.8 7.4 7.6  --   ALBUMIN 4.1 4.5 4.4  --   AST _1 --   ALT _2 --   ALKPHOS 94 103 100  --   BILITOT 0.5 0.6 0.5  --     Results for Sarah, [name] (MRN [id number]) as of 01/21/2017 09:32  Ref. Range 04/02/2016 16:03 06/18/2016 11:15 09/24/2016 10:06 12/24/2016 09:37 03/18/17 0820  Kappa free light chain Latest Ref Range: 3.3 - 19.4 mg/L 21.3 (H) 17.1 12.5 15.1 16.6  Lamda free light chains Latest Ref Range: 5.7 - 26.3 mg/L 15.2 15.8 14.6 19.3 20.0  Kappa, lamda light chain ratio Latest Ref Range: 0.26 - 1.65  1.40 1.08 0.86 0.78 0.83  M Protein SerPl Elph-Mcnc Latest Ref Range: Not Observed g/dL _3      RADIOGRAPHIC STUDIES: I have personally reviewed the radiological images as listed and agreed with the findings in the report. No results found.  ASSESSMENT & PLAN:   Multiple myeloma in remission ([place]) Multiple myeloma IgA lambda status post autologous stem cell transplant in December 2016 [[facility]].  HELD maintenance revlimid since July 2018 sec to ONJ [maintenance- 33m 3 w On & 1 week OFF] .   # JUNE 2018- M- protein- NEGATIVE/ K-L=Normal. Skeletal survey- Normal. Hb- 11; WBC- 5.1 platelets- 121. Clinically no evidence of progression at this time. Continue to HOLD Revlimid for now.  # Given severe fatigue/nausea- improved; ? off Revlimid. Will check iron studies today given  patient fatigue and anemia.   # ONJ- AUG 2018- I  reviewed the note from [place]. Given the risk  of continued Zometa currently outweighs the benefits- I would recommend discontinuation of Zometa at this time. Patient takes calcium and vitamin d currently.   # Nausea chronic on Phenergan 25 mg prn.   # Chronic diarrhea alternating with constipation-  Off Revlimid and symptoms have improved but not resolved. Colestyramine prn per GI according to pt.    #  Hypomagnesemia- Mg 1.5 today. IV mag today and continue with weekly lab monitoring and replenishment as needed. Intolerance to oral magnesium as it worsened her diarrhea.    # Chronic back pain- pain management follow-up; on fentanyl  25 mc/ [Dr.James; [facility]; [place]]; no BTP.   # Magnesium today. Weekly magnesium checks with IV magnesium with levels are low. In 4 weeks return for labs (CBC, MM panel, kappa-lamda light chains, Mg, and Cmet). In 5 weeks follow-up with MD.     Verlon Au, NP 04/15/2017 12:54 PM

## 2017-04-15 NOTE — Patient Instructions (Signed)
Hypomagnesemia  Hypomagnesemia is a condition in which the level of magnesium in the blood is low. Magnesium is a mineral that is found in many foods. It is used in many different processes in the body. Hypomagnesemia can affect every organ in the body. It can cause life-threatening problems.  What are the causes?  Causes of hypomagnesemia include:   Not getting enough magnesium in your diet.   Malnutrition.   Problems with absorbing magnesium from the intestines.   Dehydration.   Alcohol abuse.   Vomiting.   Severe diarrhea.   Some medicines, including medicines that make you urinate more.   Certain diseases, such as kidney disease, diabetes, and overactive thyroid.    What are the signs or symptoms?   Involuntary shaking or trembling of a body part (tremor).   Confusion.   Muscle weakness.   Sensitivity to light, sound, and touch.   Psychiatric issues, such as depression, irritability, or psychosis.   Sudden tightening of muscles (muscle spasms).   Tingling in the arms and legs.   A feeling of fluttering of the heart.  These symptoms are more severe if magnesium levels drop suddenly.  How is this diagnosed?  To make a diagnosis, your health care provider will do a physical exam and order blood and urine tests.  How is this treated?  Treatment will depend on the cause and the severity of your condition. It may involve:   A magnesium supplement. This can be taken in pill form. It can also be given through an IV tube. This is usually done if the condition is severe.   Changes to your diet. You may be directed to eat foods that have a lot of magnesium, such as green leafy vegetables, peas, beans, and nuts.   Eliminating alcohol from your diet.    Follow these instructions at home:   Include foods with magnesium in your diet. Foods that are rich in magnesium include green vegetables, beans, nuts and seeds, and whole grains.   Take medicines only as directed by your health care provider.   Take  magnesium supplements if your health care provider instructs you to do that. Take them as directed.   Have your magnesium levels monitored as directed by your health care provider.   When you are active, drink fluids that contain electrolytes.   Keep all follow-up visits as directed by your health care provider. This is important.  Contact a health care provider if:   You get worse instead of better.   Your symptoms return.  Get help right away if:   Your symptoms are severe.  This information is not intended to replace advice given to you by your health care provider. Make sure you discuss any questions you have with your health care provider.  Document Released: 03/13/2005 Document Revised: 11/23/2015 Document Reviewed: 01/31/2014  Elsevier Interactive Patient Education  2018 [employer]vier [employer]

## 2017-04-22 ENCOUNTER — Inpatient Hospital Stay: Payer: Managed Care, Other (non HMO)

## 2017-04-22 DIAGNOSIS — C9001 Multiple myeloma in remission: Secondary | ICD-10-CM

## 2017-04-22 DIAGNOSIS — C9000 Multiple myeloma not having achieved remission: Secondary | ICD-10-CM | POA: Diagnosis not present

## 2017-04-22 LAB — MAGNESIUM: Magnesium: 1.4 mg/dL — ABNORMAL LOW (ref 1.7–2.4)

## 2017-04-22 MED ORDER — SODIUM CHLORIDE 0.9 % IV SOLN
Freq: Once | INTRAVENOUS | Status: AC
  Administered 2017-04-22: 09:00:00 via INTRAVENOUS
  Filled 2017-04-22: qty 1000

## 2017-04-22 MED ORDER — MAGNESIUM SULFATE 2 GM/50ML IV SOLN
2.0000 g | Freq: Once | INTRAVENOUS | Status: AC
  Administered 2017-04-22: 2 g via INTRAVENOUS
  Filled 2017-04-22: qty 50

## 2017-04-22 NOTE — Patient Instructions (Signed)
Hypomagnesemia  Hypomagnesemia is a condition in which the level of magnesium in the blood is low. Magnesium is a mineral that is found in many foods. It is used in many different processes in the body. Hypomagnesemia can affect every organ in the body. It can cause life-threatening problems.  What are the causes?  Causes of hypomagnesemia include:   Not getting enough magnesium in your diet.   Malnutrition.   Problems with absorbing magnesium from the intestines.   Dehydration.   Alcohol abuse.   Vomiting.   Severe diarrhea.   Some medicines, including medicines that make you urinate more.   Certain diseases, such as kidney disease, diabetes, and overactive thyroid.    What are the signs or symptoms?   Involuntary shaking or trembling of a body part (tremor).   Confusion.   Muscle weakness.   Sensitivity to light, sound, and touch.   Psychiatric issues, such as depression, irritability, or psychosis.   Sudden tightening of muscles (muscle spasms).   Tingling in the arms and legs.   A feeling of fluttering of the heart.  These symptoms are more severe if magnesium levels drop suddenly.  How is this diagnosed?  To make a diagnosis, your health care provider will do a physical exam and order blood and urine tests.  How is this treated?  Treatment will depend on the cause and the severity of your condition. It may involve:   A magnesium supplement. This can be taken in pill form. It can also be given through an IV tube. This is usually done if the condition is severe.   Changes to your diet. You may be directed to eat foods that have a lot of magnesium, such as green leafy vegetables, peas, beans, and nuts.   Eliminating alcohol from your diet.    Follow these instructions at home:   Include foods with magnesium in your diet. Foods that are rich in magnesium include green vegetables, beans, nuts and seeds, and whole grains.   Take medicines only as directed by your health care provider.   Take  magnesium supplements if your health care provider instructs you to do that. Take them as directed.   Have your magnesium levels monitored as directed by your health care provider.   When you are active, drink fluids that contain electrolytes.   Keep all follow-up visits as directed by your health care provider. This is important.  Contact a health care provider if:   You get worse instead of better.   Your symptoms return.  Get help right away if:   Your symptoms are severe.  This information is not intended to replace advice given to you by your health care provider. Make sure you discuss any questions you have with your health care provider.  Document Released: 03/13/2005 Document Revised: 11/23/2015 Document Reviewed: 01/31/2014  Elsevier Interactive Patient Education  2018 [employer]vier [employer]

## 2017-04-29 ENCOUNTER — Inpatient Hospital Stay: Payer: Managed Care, Other (non HMO)

## 2017-04-29 ENCOUNTER — Other Ambulatory Visit: Payer: Self-pay | Admitting: Internal Medicine

## 2017-04-29 VITALS — BP 134/80 | HR 74 | Temp 94.5°F | Resp 18

## 2017-04-29 DIAGNOSIS — C9001 Multiple myeloma in remission: Secondary | ICD-10-CM

## 2017-04-29 DIAGNOSIS — C9000 Multiple myeloma not having achieved remission: Secondary | ICD-10-CM | POA: Diagnosis not present

## 2017-04-29 LAB — MAGNESIUM: MAGNESIUM: 1.3 mg/dL — AB (ref 1.7–2.4)

## 2017-04-29 MED ORDER — SODIUM CHLORIDE 0.9 % IV SOLN
Freq: Once | INTRAVENOUS | Status: AC
  Administered 2017-04-29: 09:00:00 via INTRAVENOUS
  Filled 2017-04-29: qty 1000

## 2017-04-29 MED ORDER — MAGNESIUM SULFATE 4 GM/100ML IV SOLN
4.0000 g | Freq: Once | INTRAVENOUS | Status: AC
  Administered 2017-04-29: 4 g via INTRAVENOUS
  Filled 2017-04-29: qty 100

## 2017-04-29 NOTE — Patient Instructions (Signed)
Hypomagnesemia  Hypomagnesemia is a condition in which the level of magnesium in the blood is low. Magnesium is a mineral that is found in many foods. It is used in many different processes in the body. Hypomagnesemia can affect every organ in the body. It can cause life-threatening problems.  What are the causes?  Causes of hypomagnesemia include:   Not getting enough magnesium in your diet.   Malnutrition.   Problems with absorbing magnesium from the intestines.   Dehydration.   Alcohol abuse.   Vomiting.   Severe diarrhea.   Some medicines, including medicines that make you urinate more.   Certain diseases, such as kidney disease, diabetes, and overactive thyroid.    What are the signs or symptoms?   Involuntary shaking or trembling of a body part (tremor).   Confusion.   Muscle weakness.   Sensitivity to light, sound, and touch.   Psychiatric issues, such as depression, irritability, or psychosis.   Sudden tightening of muscles (muscle spasms).   Tingling in the arms and legs.   A feeling of fluttering of the heart.  These symptoms are more severe if magnesium levels drop suddenly.  How is this diagnosed?  To make a diagnosis, your health care provider will do a physical exam and order blood and urine tests.  How is this treated?  Treatment will depend on the cause and the severity of your condition. It may involve:   A magnesium supplement. This can be taken in pill form. It can also be given through an IV tube. This is usually done if the condition is severe.   Changes to your diet. You may be directed to eat foods that have a lot of magnesium, such as green leafy vegetables, peas, beans, and nuts.   Eliminating alcohol from your diet.    Follow these instructions at home:   Include foods with magnesium in your diet. Foods that are rich in magnesium include green vegetables, beans, nuts and seeds, and whole grains.   Take medicines only as directed by your health care provider.   Take  magnesium supplements if your health care provider instructs you to do that. Take them as directed.   Have your magnesium levels monitored as directed by your health care provider.   When you are active, drink fluids that contain electrolytes.   Keep all follow-up visits as directed by your health care provider. This is important.  Contact a health care provider if:   You get worse instead of better.   Your symptoms return.  Get help right away if:   Your symptoms are severe.  This information is not intended to replace advice given to you by your health care provider. Make sure you discuss any questions you have with your health care provider.  Document Released: 03/13/2005 Document Revised: 11/23/2015 Document Reviewed: 01/31/2014  Elsevier Interactive Patient Education  2018 [employer]vier [employer]

## 2017-05-01 ENCOUNTER — Encounter: Payer: Self-pay | Admitting: Gastroenterology

## 2017-05-01 ENCOUNTER — Ambulatory Visit (INDEPENDENT_AMBULATORY_CARE_PROVIDER_SITE_OTHER): Payer: Managed Care, Other (non HMO) | Admitting: Gastroenterology

## 2017-05-01 VITALS — BP 149/83 | HR 73 | Temp 98.1°F | Ht 63.0 in | Wt 150.8 lb

## 2017-05-01 DIAGNOSIS — K219 Gastro-esophageal reflux disease without esophagitis: Secondary | ICD-10-CM

## 2017-05-01 DIAGNOSIS — R935 Abnormal findings on diagnostic imaging of other abdominal regions, including retroperitoneum: Secondary | ICD-10-CM

## 2017-05-01 NOTE — Progress Notes (Signed)
[name] MD, MRCP(U.K) [address]  [place]  [place], [place] [address]  Main: [phone]  Fax: [phone]   Gastroenterology Consultation  Referring Provider:     Glean Hess, MD Primary Care Physician:  Glean Hess, MD Primary Gastroenterologist:  Dr. [name]  Reason for Consultation:  GERD        HPI:   Sarah [name] is a 61 y.o. y/o female referred for consultation & management  by Dr. Army Melia, Jesse Sans, MD.     She has a history of multiple myeloma , bone lesions - she is in remission. She has a history of diarrhea/constipation. Ct abdomen in 09/2016 showed scattered wall thickening of the colon in the ascending and transverse colon .  She suffers from low magnesium which is being replaced orally. H 11 grams 2 weeks back with MCV 88 .  She says that her main issue is heartburn.   Reflux:  Onset : 15 years back  Symptoms: feels like a burning sensation in her throat , burping hurts  Recent weight gain: gained weight.  Narcotics or anticholinergics use : Fentanyl patch , no vicodin now  PPI /H2 blockers or Antacid  use and timing :Prilosec - takes in the morning on empty stomach and no food after. She takes at night PRN alka seltzer chewables/gummies  Dinner time : 6-7 pm ,goes to bed at around 10 pm . In between watching tv   Prior EGD: yes says they found a "red spot" and biopsy was normal ,  Family history of esophageal cancer:no    Presently says has no diarrhea once she went off one of her medications. She does have diarrhea when she goes on magnesium . She has had a colonoscopy 3-4 years back. The last time she was told she had a tortious colon.    Past Medical History:  Diagnosis Date  . Anemia   . Anxiety   . Arthritis   . Bicuspid aortic valve   . CHF (congestive heart failure) ([place])   . CKD (chronic kidney disease) stage 3, GFR 30-59 ml/min (HCC)   . Depression   . Diabetes mellitus ([place])   . Dizziness   . Fatty liver    . Frequent falls   . GERD (gastroesophageal reflux disease)   . Gout   . Heart murmur   . History of blood transfusion   . History of bone marrow transplant ([place])   . History of uterine fibroid   . Hypertension   . Hypomagnesemia   . Multiple myeloma ([place])   . Renal cyst     Past Surgical History:  Procedure Laterality Date  . 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 CARDIAC ELECTROPHYSIOLOGY MAPPING AND ABLATION    . CARPAL TUNNEL RELEASE Bilateral   . CHOLECYSTECTOMY  2008  . FOOT SURGERY Bilateral   . INCONTINENCE SURGERY  2009  . PARTIAL HYSTERECTOMY  03/1996   fibroids  . TONSILLECTOMY  2007    Prior to Admission medications   Medication Sig Start Date End Date Taking? Authorizing Provider  glucose blood (ONE TOUCH ULTRA TEST) test strip  09/04/15  Yes [provider]  HYDROcodone-ibuprofen (VICOPROFEN) 7.5-200 MG tablet Take by mouth. 04/02/16  Yes [provider]  lenalidomide (REVLIMID) 5 MG capsule Take by mouth. 04/18/16  Yes [provider]  traZODone (DESYREL) 50 MG tablet Take by mouth. 05/30/16  Yes [provider]  acyclovir (ZOVIRAX) 200 MG capsule Take 2 capsules (  400 mg total) by mouth 3 (three) times daily. 05/30/16   Glean Hess, MD  allopurinol (ZYLOPRIM) 100 MG tablet TAKE 1 TABLET(100 MG) BY MOUTH DAILY 04/29/17   Glean Hess, MD  amoxicillin-clavulanate (AUGMENTIN) 500-125 MG tablet TK 1 T PO BID UNTIL GONE 04/17/17   [provider]  Biotin 1 MG CAPS Take by mouth.    [provider]  bisoprolol (ZEBETA) 10 MG tablet TAKE 1 TABLET(10 MG) BY MOUTH DAILY 04/29/17   Glean Hess, MD  Calcium Carb-Cholecalciferol (CALCIUM CARBONATE-VITAMIN D3 PO) Take by mouth.    [provider]  Calcium Carbonate-Vit D-Min (CALCIUM 600+D PLUS MINERALS) 600-400 MG-UNIT TABS Take 1 tablet by mouth daily.     [provider]  cholestyramine (QUESTRAN) 4 g packet Take 4 g once daily. 06/18/16    Cammie Sickle, MD  diphenoxylate-atropine (LOMOTIL) 2.5-0.025 MG tablet Take 2 tablets by mouth daily as needed for diarrhea or loose stools. 09/24/16   Cammie Sickle, MD  DULoxetine (CYMBALTA) 60 MG capsule TAKE 1 CAPSULE(60 MG) BY MOUTH TWICE DAILY 04/29/17   Glean Hess, MD  fentaNYL (DURAGESIC - DOSED MCG/HR) 25 MCG/HR patch Place 25 mcg onto the skin every 3 (three) days. 08/12/16   [provider]  fexofenadine (ALLEGRA) 180 MG tablet Take 1 tablet (180 mg total) by mouth daily. 05/30/16   Glean Hess, MD  fluticasone (FLONASE) 50 MCG/ACT nasal spray Place 2 sprays into both nostrils daily. 05/30/16   Glean Hess, MD  furosemide (LASIX) 20 MG tablet Take 0.5 tablets (10 mg total) by mouth daily as needed. 05/30/16   Glean Hess, MD  hydrOXYzine (ATARAX/VISTARIL) 10 MG tablet TAKE 1 TABLET(10 MG) BY MOUTH THREE TIMES DAILY AS NEEDED 12/11/16   Glean Hess, MD  montelukast (SINGULAIR) 10 MG tablet Take 1 tablet (10 mg total) by mouth at bedtime. 05/30/16   Glean Hess, MD  Multiple Vitamins-Minerals (HAIR/SKIN/NAILS/BIOTIN PO) Take 1 capsule by mouth 3 (three) times daily.    [provider]  omeprazole (PRILOSEC) 40 MG capsule Take 1 capsule (40 mg total) by mouth 2 (two) times daily. 04/09/17   Glean Hess, MD  oxyCODONE-acetaminophen (PERCOCET/ROXICET) 5-325 MG tablet TK 1 T PO Q 6 H PRN P 04/17/17   [provider]  promethazine (PHENERGAN) 25 MG tablet 1-2 pills prior to meals twice a day as needed 12/24/16   Cammie Sickle, MD  tiZANidine (ZANAFLEX) 4 MG tablet TAKE 1 TABLET BY MOUTH EVERY 8 HOURS AS NEEDED, MUSCLE RELAXER FOR BACK PAIN 04/04/17   Glean Hess, MD  zaleplon (SONATA) 10 MG capsule TAKE 1 CAPSULE BY MOUTH EVERY DAY AT BEDTIME AS NEEDED FOR SLEEP 02/07/17   Glean Hess, MD  Zoledronic Acid (ZOMETA) 4 MG/100ML IVPB Inject into the vein.    [provider]    Family  History  Problem Relation Age of Onset  . Colon cancer Father   . Renal Disease Father   . Diabetes Mellitus II Father   . Melanoma Paternal Grandmother   . Breast cancer Maternal Aunt   . Anemia Mother   . Heart disease Mother   . Heart failure Mother   . Renal Disease Mother   . Congestive Heart Failure Mother   . Heart disease Maternal Uncle   . Throat cancer Maternal Uncle   . Lung cancer Maternal Uncle   . Liver disease Maternal Uncle   . Heart failure  Maternal Uncle   . Hearing loss Son 28       Suicide      Social History  Substance Use Topics  . Smoking status: Former Smoker    Packs/day: 1.00    Years: 20.00    Types: Cigarettes    Quit date: 07/02/1991  . Smokeless tobacco: Never Used  . Alcohol use 1.2 oz/week    1 Glasses of wine, 1 Cans of beer per week     Comment: occassional    Allergies as of 05/01/2017 - Review Complete 04/15/2017  Allergen Reaction Noted  . Oxycodone-acetaminophen  09/01/2011  . Benadryl [diphenhydramine] Palpitations 04/02/2016  . Morphine Itching and Rash 04/02/2016  . Ondansetron Diarrhea 09/01/2011  . Tylenol [acetaminophen] Itching and Rash 04/02/2016    Review of Systems:    All systems reviewed and negative except where noted in HPI.   Physical Exam:  There were no vitals taken for this visit. No LMP recorded. Patient has had a hysterectomy. Psych:  Alert and cooperative. Normal mood and affect. General:   Alert,  Well-developed, well-nourished, pleasant and cooperative in NAD Head:  Normocephalic and atraumatic. Eyes:  Sclera clear, no icterus.   Conjunctiva pink. Ears:  Normal auditory acuity. Nose:  No deformity, discharge, or lesions. Mouth:  No deformity or lesions,oropharynx pink & moist. Neck:  Supple; no masses or thyromegaly. Lungs:  Respirations even and unlabored.  Clear throughout to auscultation.   No wheezes, crackles, or rhonchi. No acute distress. Heart:  Regular rate and rhythm; no murmurs, clicks,  rubs, or gallops. Abdomen:  Normal bowel sounds.  No bruits.  Soft, non-tender and non-distended without masses, hepatosplenomegaly or hernias noted.  No guarding or rebound tenderness.    Neurologic:  Alert and oriented x3;  grossly normal neurologically. Skin:  Intact without significant lesions or rashes. No jaundice. Lymph Nodes:  No significant cervical adenopathy. Psych:  Alert and cooperative. Normal mood and affect.  Imaging Studies: No results found.  Assessment and Plan:   Sarah [name] is a 61 y.o. 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 She has not been taking her PPI the right way. Will add zantac at night . Counseled on appropriate intake of Prilosec . We will get in touch with his doctor to have the magnesium replaced prior to the procedure. She denies any diarrhea presently , am unsure why she is chronically depleted with her magnesium - may need a nephrology referral to determine etiology . She had thickening seen on her CT scan of her colon which needs evaluation.   Plan 1. Zantac 150 mg at night  2. Replace magnesium and when normal will ned EGD+colonoscopy (evaluate thickening seen on CT scan in 09/2016 ) 3. Use a wedge pillow for reflux, avoid eating 2 hours before meals 4. Take Prilosec in the morning on an empty stomach and eat 30 mins after  I have discussed risks & benefits of the procedure  which include, but are not limited to, bleeding, infection, perforation,respiratory compromise & drug reaction.  The patient agrees with this plan & written consent will be obtained.     Follow up in 6-8 weeks  Dr [name] MD,MRCP(U.K)

## 2017-05-01 NOTE — Addendum Note (Signed)
Addended by: Peggye Ley on: 05/01/2017 04:12 PM   Modules accepted: Orders, SmartSet

## 2017-05-02 ENCOUNTER — Other Ambulatory Visit: Payer: Self-pay | Admitting: Internal Medicine

## 2017-05-06 ENCOUNTER — Inpatient Hospital Stay: Payer: Managed Care, Other (non HMO)

## 2017-05-06 ENCOUNTER — Inpatient Hospital Stay: Payer: Managed Care, Other (non HMO) | Attending: Internal Medicine

## 2017-05-06 VITALS — BP 124/81 | HR 93 | Temp 97.8°F | Resp 18

## 2017-05-06 DIAGNOSIS — K5900 Constipation, unspecified: Secondary | ICD-10-CM | POA: Insufficient documentation

## 2017-05-06 DIAGNOSIS — M79601 Pain in right arm: Secondary | ICD-10-CM | POA: Diagnosis not present

## 2017-05-06 DIAGNOSIS — F329 Major depressive disorder, single episode, unspecified: Secondary | ICD-10-CM | POA: Insufficient documentation

## 2017-05-06 DIAGNOSIS — K219 Gastro-esophageal reflux disease without esophagitis: Secondary | ICD-10-CM | POA: Insufficient documentation

## 2017-05-06 DIAGNOSIS — Q231 Congenital insufficiency of aortic valve: Secondary | ICD-10-CM | POA: Insufficient documentation

## 2017-05-06 DIAGNOSIS — N189 Chronic kidney disease, unspecified: Secondary | ICD-10-CM | POA: Diagnosis not present

## 2017-05-06 DIAGNOSIS — C9001 Multiple myeloma in remission: Secondary | ICD-10-CM | POA: Insufficient documentation

## 2017-05-06 DIAGNOSIS — E119 Type 2 diabetes mellitus without complications: Secondary | ICD-10-CM | POA: Insufficient documentation

## 2017-05-06 DIAGNOSIS — G629 Polyneuropathy, unspecified: Secondary | ICD-10-CM | POA: Diagnosis not present

## 2017-05-06 DIAGNOSIS — I129 Hypertensive chronic kidney disease with stage 1 through stage 4 chronic kidney disease, or unspecified chronic kidney disease: Secondary | ICD-10-CM | POA: Diagnosis not present

## 2017-05-06 DIAGNOSIS — F419 Anxiety disorder, unspecified: Secondary | ICD-10-CM | POA: Diagnosis not present

## 2017-05-06 DIAGNOSIS — Z87891 Personal history of nicotine dependence: Secondary | ICD-10-CM | POA: Insufficient documentation

## 2017-05-06 DIAGNOSIS — N183 Chronic kidney disease, stage 3 (moderate): Secondary | ICD-10-CM | POA: Diagnosis not present

## 2017-05-06 DIAGNOSIS — R197 Diarrhea, unspecified: Secondary | ICD-10-CM | POA: Insufficient documentation

## 2017-05-06 DIAGNOSIS — M109 Gout, unspecified: Secondary | ICD-10-CM | POA: Diagnosis not present

## 2017-05-06 DIAGNOSIS — M549 Dorsalgia, unspecified: Secondary | ICD-10-CM | POA: Diagnosis not present

## 2017-05-06 DIAGNOSIS — Z800 Family history of malignant neoplasm of digestive organs: Secondary | ICD-10-CM | POA: Insufficient documentation

## 2017-05-06 DIAGNOSIS — N281 Cyst of kidney, acquired: Secondary | ICD-10-CM | POA: Diagnosis not present

## 2017-05-06 DIAGNOSIS — Z79899 Other long term (current) drug therapy: Secondary | ICD-10-CM | POA: Diagnosis not present

## 2017-05-06 DIAGNOSIS — R110 Nausea: Secondary | ICD-10-CM | POA: Insufficient documentation

## 2017-05-06 DIAGNOSIS — Z9481 Bone marrow transplant status: Secondary | ICD-10-CM | POA: Insufficient documentation

## 2017-05-06 DIAGNOSIS — Z801 Family history of malignant neoplasm of trachea, bronchus and lung: Secondary | ICD-10-CM | POA: Insufficient documentation

## 2017-05-06 DIAGNOSIS — R5383 Other fatigue: Secondary | ICD-10-CM | POA: Diagnosis not present

## 2017-05-06 DIAGNOSIS — I509 Heart failure, unspecified: Secondary | ICD-10-CM | POA: Insufficient documentation

## 2017-05-06 DIAGNOSIS — C9000 Multiple myeloma not having achieved remission: Secondary | ICD-10-CM | POA: Diagnosis present

## 2017-05-06 LAB — COMPREHENSIVE METABOLIC PANEL
ALBUMIN: 4.6 g/dL (ref 3.5–5.0)
ALK PHOS: 101 U/L (ref 38–126)
ALT: 25 U/L (ref 14–54)
AST: 33 U/L (ref 15–41)
Anion gap: 10 (ref 5–15)
BILIRUBIN TOTAL: 0.4 mg/dL (ref 0.3–1.2)
BUN: 18 mg/dL (ref 6–20)
CALCIUM: 9.2 mg/dL (ref 8.9–10.3)
CO2: 26 mmol/L (ref 22–32)
Chloride: 103 mmol/L (ref 101–111)
Creatinine, Ser: 1.13 mg/dL — ABNORMAL HIGH (ref 0.44–1.00)
GFR calc Af Amer: 60 mL/min — ABNORMAL LOW (ref >60)
GFR calc non Af Amer: 52 mL/min — ABNORMAL LOW (ref >60)
GLUCOSE: 137 mg/dL — AB (ref 65–99)
Potassium: 4.4 mmol/L (ref 3.5–5.1)
Sodium: 139 mmol/L (ref 135–145)
TOTAL PROTEIN: 7.9 g/dL (ref 6.5–8.1)

## 2017-05-06 LAB — CBC WITH DIFFERENTIAL/PLATELET
BASOS ABS: 0 10*3/uL (ref 0–0.1)
BASOS PCT: 1 %
EOS PCT: 1 %
Eosinophils Absolute: 0.1 10*3/uL (ref 0–0.7)
HCT: 35 % (ref 35.0–47.0)
Hemoglobin: 12 g/dL (ref 12.0–16.0)
Lymphocytes Relative: 17 %
Lymphs Abs: 1.1 10*3/uL (ref 1.0–3.6)
MCH: 29.9 pg (ref 26.0–34.0)
MCHC: 34.3 g/dL (ref 32.0–36.0)
MCV: 87.2 fL (ref 80.0–100.0)
MONO ABS: 0.4 10*3/uL (ref 0.2–0.9)
Monocytes Relative: 7 %
Neutro Abs: 4.6 10*3/uL (ref 1.4–6.5)
Neutrophils Relative %: 74 %
PLATELETS: 141 10*3/uL — AB (ref 150–440)
RBC: 4.02 MIL/uL (ref 3.80–5.20)
RDW: 13.8 % (ref 11.5–14.5)
WBC: 6.2 10*3/uL (ref 3.6–11.0)

## 2017-05-06 LAB — MAGNESIUM: MAGNESIUM: 1.8 mg/dL (ref 1.7–2.4)

## 2017-05-06 MED ORDER — MAGNESIUM SULFATE 4 GM/100ML IV SOLN: 4.0000 g | Freq: Once | INTRAVENOUS | Status: DC

## 2017-05-06 MED ORDER — MAGNESIUM SULFATE 2 GM/50ML IV SOLN
2.0000 g | Freq: Once | INTRAVENOUS | Status: AC
  Administered 2017-05-06: 2 g via INTRAVENOUS

## 2017-05-06 MED ORDER — SODIUM CHLORIDE 0.9 % IV SOLN
Freq: Once | INTRAVENOUS | Status: AC
  Administered 2017-05-06: 09:00:00 via INTRAVENOUS
  Filled 2017-05-06: qty 1000

## 2017-05-07 ENCOUNTER — Encounter
Admission: RE | Disposition: A | Discharge status: Home / Self Care | Payer: Self-pay | Source: Ambulatory Visit | Attending: Gastroenterology

## 2017-05-07 ENCOUNTER — Ambulatory Visit: Payer: Managed Care, Other (non HMO) | Admitting: Anesthesiology

## 2017-05-07 ENCOUNTER — Ambulatory Visit
Admission: RE | Admit: 2017-05-07 | Discharge: 2017-05-07 | Disposition: A | Discharge status: Home / Self Care | Payer: Managed Care, Other (non HMO) | Source: Ambulatory Visit | Attending: Gastroenterology | Admitting: Gastroenterology

## 2017-05-07 ENCOUNTER — Encounter: Payer: Self-pay | Admitting: Anesthesiology

## 2017-05-07 DIAGNOSIS — M109 Gout, unspecified: Secondary | ICD-10-CM | POA: Insufficient documentation

## 2017-05-07 DIAGNOSIS — F329 Major depressive disorder, single episode, unspecified: Secondary | ICD-10-CM | POA: Insufficient documentation

## 2017-05-07 DIAGNOSIS — I509 Heart failure, unspecified: Secondary | ICD-10-CM | POA: Insufficient documentation

## 2017-05-07 DIAGNOSIS — K2970 Gastritis, unspecified, without bleeding: Secondary | ICD-10-CM | POA: Diagnosis not present

## 2017-05-07 DIAGNOSIS — I130 Hypertensive heart and chronic kidney disease with heart failure and stage 1 through stage 4 chronic kidney disease, or unspecified chronic kidney disease: Secondary | ICD-10-CM | POA: Diagnosis not present

## 2017-05-07 DIAGNOSIS — Q231 Congenital insufficiency of aortic valve: Secondary | ICD-10-CM | POA: Insufficient documentation

## 2017-05-07 DIAGNOSIS — Z87891 Personal history of nicotine dependence: Secondary | ICD-10-CM | POA: Diagnosis not present

## 2017-05-07 DIAGNOSIS — D649 Anemia, unspecified: Secondary | ICD-10-CM | POA: Insufficient documentation

## 2017-05-07 DIAGNOSIS — N183 Chronic kidney disease, stage 3 (moderate): Secondary | ICD-10-CM | POA: Diagnosis not present

## 2017-05-07 DIAGNOSIS — E1122 Type 2 diabetes mellitus with diabetic chronic kidney disease: Secondary | ICD-10-CM | POA: Insufficient documentation

## 2017-05-07 DIAGNOSIS — F419 Anxiety disorder, unspecified: Secondary | ICD-10-CM | POA: Diagnosis not present

## 2017-05-07 DIAGNOSIS — R935 Abnormal findings on diagnostic imaging of other abdominal regions, including retroperitoneum: Secondary | ICD-10-CM | POA: Diagnosis not present

## 2017-05-07 DIAGNOSIS — Z79899 Other long term (current) drug therapy: Secondary | ICD-10-CM | POA: Insufficient documentation

## 2017-05-07 DIAGNOSIS — M1990 Unspecified osteoarthritis, unspecified site: Secondary | ICD-10-CM | POA: Insufficient documentation

## 2017-05-07 DIAGNOSIS — K219 Gastro-esophageal reflux disease without esophagitis: Secondary | ICD-10-CM | POA: Diagnosis not present

## 2017-05-07 HISTORY — PX: COLONOSCOPY WITH PROPOFOL: SHX5780

## 2017-05-07 HISTORY — PX: ESOPHAGOGASTRODUODENOSCOPY (EGD) WITH PROPOFOL: SHX5813

## 2017-05-07 LAB — KAPPA/LAMBDA LIGHT CHAINS
Kappa free light chain: 15.1 mg/L (ref 3.3–19.4)
Kappa, lambda light chain ratio: 0.72 (ref 0.26–1.65)
LAMDA FREE LIGHT CHAINS: 21 mg/L (ref 5.7–26.3)

## 2017-05-07 LAB — GLUCOSE, CAPILLARY: GLUCOSE-CAPILLARY: 88 mg/dL (ref 65–99)

## 2017-05-07 SURGERY — ESOPHAGOGASTRODUODENOSCOPY (EGD) WITH PROPOFOL
Anesthesia: General

## 2017-05-07 MED ORDER — GLYCOPYRROLATE 0.2 MG/ML IV SOSY
PREFILLED_SYRINGE | INTRAVENOUS | Status: DC | PRN
  Administered 2017-05-07: .2 mg via INTRAVENOUS

## 2017-05-07 MED ORDER — GLYCOPYRROLATE 0.2 MG/ML IJ SOLN
INTRAMUSCULAR | Status: AC
  Filled 2017-05-07: qty 1

## 2017-05-07 MED ORDER — PROPOFOL 500 MG/50ML IV EMUL
INTRAVENOUS | Status: AC
  Filled 2017-05-07: qty 50

## 2017-05-07 MED ORDER — SODIUM CHLORIDE 0.9 % IV SOLN
INTRAVENOUS | Status: DC
  Administered 2017-05-07: 14:00:00 via INTRAVENOUS

## 2017-05-07 MED ORDER — IPRATROPIUM-ALBUTEROL 0.5-2.5 (3) MG/3ML IN SOLN
3.0000 mL | Freq: Once | RESPIRATORY_TRACT | Status: AC
  Administered 2017-05-07: 3 mL via RESPIRATORY_TRACT

## 2017-05-07 MED ORDER — LIDOCAINE HCL (PF) 2 % IJ SOLN
INTRAMUSCULAR | Status: AC
  Filled 2017-05-07: qty 10

## 2017-05-07 MED ORDER — IPRATROPIUM-ALBUTEROL 0.5-2.5 (3) MG/3ML IN SOLN
RESPIRATORY_TRACT | Status: AC
  Filled 2017-05-07: qty 3

## 2017-05-07 MED ORDER — PROPOFOL 500 MG/50ML IV EMUL
INTRAVENOUS | Status: DC | PRN
  Administered 2017-05-07: 160 ug/kg/min via INTRAVENOUS

## 2017-05-07 MED ORDER — PROPOFOL 10 MG/ML IV BOLUS
INTRAVENOUS | Status: DC | PRN
  Administered 2017-05-07: 70 mg via INTRAVENOUS
  Administered 2017-05-07: 100 mg via INTRAVENOUS
  Administered 2017-05-07: 40 mg via INTRAVENOUS

## 2017-05-07 MED ORDER — LIDOCAINE HCL (CARDIAC) 20 MG/ML IV SOLN
INTRAVENOUS | Status: DC | PRN
  Administered 2017-05-07: 100 mg via INTRAVENOUS

## 2017-05-07 NOTE — Anesthesia Postprocedure Evaluation (Signed)
Anesthesia Post Note  Patient: Sarah [name]  Procedure(s) Performed: ESOPHAGOGASTRODUODENOSCOPY (EGD) WITH PROPOFOL (N/A ) COLONOSCOPY WITH PROPOFOL (N/A )  Patient location during evaluation: Endoscopy Anesthesia Type: General Level of consciousness: awake and alert Pain management: pain level controlled Vital Signs Assessment: post-procedure vital signs reviewed and stable Respiratory status: spontaneous breathing, nonlabored ventilation, respiratory function stable and patient connected to nasal cannula oxygen Cardiovascular status: blood pressure returned to baseline and stable Postop Assessment: no apparent nausea or vomiting Anesthetic complications: no     Last Vitals:  Vitals:   05/07/17 1510 05/07/17 1520  BP: 111/66   Pulse:    Resp: 16 16  Temp:    SpO2: 98%     Last Pain:  Vitals:   05/07/17 1500  TempSrc: Tympanic                 Precious Haws Piscitello

## 2017-05-07 NOTE — Anesthesia Preprocedure Evaluation (Signed)
Anesthesia Evaluation  Patient identified by MRN, date of birth, ID band Patient awake    Reviewed: Allergy & Precautions, H&P , NPO status , Patient's Chart, lab work & pertinent test results  History of Anesthesia Complications Negative for: history of anesthetic complications  Airway Mallampati: III  TM Distance: >3 FB Neck ROM: full    Dental  (+) Chipped, Poor Dentition, Missing, Edentulous Upper, Upper Dentures   Pulmonary neg shortness of breath, asthma , former smoker,           Cardiovascular Exercise Tolerance: Good hypertension, (-) angina+CHF  (-) Past MI + dysrhythmias Supra Ventricular Tachycardia + Valvular Problems/Murmurs AS      Neuro/Psych PSYCHIATRIC DISORDERS Anxiety Depression negative neurological ROS     GI/Hepatic Neg liver ROS, GERD  Controlled,  Endo/Other  diabetes, Type 2  Renal/GU Renal disease  negative genitourinary   Musculoskeletal  (+) Arthritis ,   Abdominal   Peds  Hematology negative hematology ROS (+)   Anesthesia Other Findings Past Medical History: No date: Anemia No date: Anxiety No date: Arthritis No date: Bicuspid aortic valve No date: CHF (congestive heart failure) (HCC) No date: CKD (chronic kidney disease) stage 3, GFR 30-59 ml/min (HCC) No date: Depression No date: Diabetes mellitus (HCC) No date: Dizziness No date: Fatty liver No date: Frequent falls No date: GERD (gastroesophageal reflux disease) No date: Gout No date: Heart murmur No date: History of blood transfusion No date: History of bone marrow transplant ([place]) No date: History of uterine fibroid No date: Hypertension No date: Hypomagnesemia No date: Multiple myeloma ([place]) No date: Renal cyst  Past Surgical History: 06/2015: Auto Stem Cell transplant No date: CARDIAC ELECTROPHYSIOLOGY MAPPING AND ABLATION No date: CARPAL TUNNEL RELEASE; Bilateral 2008: CHOLECYSTECTOMY No date: FOOT SURGERY;  Bilateral 2009: INCONTINENCE SURGERY 03/1996: PARTIAL HYSTERECTOMY     Comment:  fibroids 2007: TONSILLECTOMY  BMI    Body Mass Index:  26.64 kg/m      Reproductive/Obstetrics negative OB ROS                             Anesthesia Physical Anesthesia Plan  ASA: III  Anesthesia Plan: General   Post-op Pain Management:    Induction: Intravenous  PONV Risk Score and Plan: 3 and Propofol infusion  Airway Management Planned: Natural Airway and Nasal Cannula  Additional Equipment:   Intra-op Plan:   Post-operative Plan:   Informed Consent: I have reviewed the patients History and Physical, chart, labs and discussed the procedure including the risks, benefits and alternatives for the proposed anesthesia with the patient or authorized representative who has indicated his/her understanding and acceptance.   Dental Advisory Given  Plan Discussed with: Anesthesiologist, CRNA and Surgeon  Anesthesia Plan Comments: (Patient consented for risks of anesthesia including but not limited to:  - adverse reactions to medications - risk of intubation if required - damage to teeth, lips or other oral mucosa - sore throat or hoarseness - Damage to heart, brain, lungs or loss of life  Patient voiced understanding.)        Anesthesia Quick Evaluation

## 2017-05-07 NOTE — H&P (Signed)
[name], MD [address], [place], [place], [place], [address] [address], [place], [place], [place], [address] Phone: [phone]  Fax: [phone]  Primary Care Physician:  Glean Hess, MD   Pre-Procedure History & Physical: HPI:  Sarah [name] is a 61 y.o. female is here for an endoscopy and colonoscopy    Past Medical History:  Diagnosis Date  . Anemia   . Anxiety   . Arthritis   . Bicuspid aortic valve   . CHF (congestive heart failure) ([place])   . CKD (chronic kidney disease) stage 3, GFR 30-59 ml/min (HCC)   . Depression   . Diabetes mellitus ([place])   . Dizziness   . Fatty liver   . Frequent falls   . GERD (gastroesophageal reflux disease)   . Gout   . Heart murmur   . History of blood transfusion   . History of bone marrow transplant ([place])   . History of uterine fibroid   . Hypertension   . Hypomagnesemia   . Multiple myeloma ([place])   . Renal cyst     Past Surgical History:  Procedure Laterality Date  . 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 CARDIAC ELECTROPHYSIOLOGY MAPPING AND ABLATION    . CARPAL TUNNEL RELEASE Bilateral   . CHOLECYSTECTOMY  2008  . FOOT SURGERY Bilateral   . INCONTINENCE SURGERY  2009  . PARTIAL HYSTERECTOMY  03/1996   fibroids  . TONSILLECTOMY  2007    Prior to Admission medications   Medication Sig Start Date End Date Taking? Authorizing Provider  acyclovir (ZOVIRAX) 200 MG capsule Take 2 capsules (400 mg total) by mouth 3 (three) times daily. 05/30/16   Glean Hess, MD  allopurinol (ZYLOPRIM) 100 MG tablet TAKE 1 TABLET(100 MG) BY MOUTH DAILY 04/29/17   Glean Hess, MD  amoxicillin-clavulanate (AUGMENTIN) 500-125 MG tablet TK 1 T PO BID UNTIL GONE 04/17/17   [provider]  Biotin 1 MG CAPS Take by mouth.    [provider]  bisoprolol (ZEBETA) 10 MG tablet TAKE 1 TABLET(10 MG) BY MOUTH DAILY 04/29/17   Glean Hess, MD  Calcium Carb-Cholecalciferol (CALCIUM  CARBONATE-VITAMIN D3 PO) Take by mouth.    [provider]  Calcium Carbonate-Vit D-Min (CALCIUM 600+D PLUS MINERALS) 600-400 MG-UNIT TABS Take 1 tablet by mouth daily.     [provider]  cholestyramine (QUESTRAN) 4 g packet Take 4 g once daily. 06/18/16   Cammie Sickle, MD  diphenoxylate-atropine (LOMOTIL) 2.5-0.025 MG tablet Take 2 tablets by mouth daily as needed for diarrhea or loose stools. 09/24/16   Cammie Sickle, MD  DULoxetine (CYMBALTA) 60 MG capsule TAKE 1 CAPSULE(60 MG) BY MOUTH TWICE DAILY 04/29/17   Glean Hess, MD  fentaNYL (DURAGESIC - DOSED MCG/HR) 25 MCG/HR patch Place 25 mcg onto the skin every 3 (three) days. 08/12/16   [provider]  fexofenadine (ALLEGRA) 180 MG tablet Take 1 tablet (180 mg total) by mouth daily. 05/30/16   Glean Hess, MD  fluticasone (FLONASE) 50 MCG/ACT nasal spray Place 2 sprays into both nostrils daily. 05/30/16   Glean Hess, MD  furosemide (LASIX) 20 MG tablet Take 0.5 tablets (10 mg total) by mouth daily as needed. 05/30/16   Glean Hess, MD  glucose blood (ONE TOUCH ULTRA TEST) test strip  09/04/15   [provider]  HYDROcodone-ibuprofen (VICOPROFEN) 7.5-200 MG tablet Take by mouth. 04/02/16   [provider]  hydrOXYzine (ATARAX/VISTARIL) 10 MG tablet TAKE 1 TABLET(10 MG) BY MOUTH THREE TIMES DAILY AS NEEDED 12/11/16   Glean Hess, MD  lenalidomide (REVLIMID) 5 MG capsule Take by mouth. 04/18/16   [provider]  montelukast (SINGULAIR) 10 MG tablet Take 1 tablet (10 mg total) by mouth at bedtime. 05/30/16   Glean Hess, MD  Multiple Vitamins-Minerals (HAIR/SKIN/NAILS/BIOTIN PO) Take 1 capsule by mouth 3 (three) times daily.    [provider]  omeprazole (PRILOSEC) 40 MG capsule Take 1 capsule (40 mg total) by mouth 2 (two) times daily. 04/09/17   Glean Hess, MD  oxyCODONE-acetaminophen (PERCOCET/ROXICET) 5-325 MG tablet TK 1 T PO  Q 6 H PRN P 04/17/17   [provider]  promethazine (PHENERGAN) 25 MG tablet 1-2 pills prior to meals twice a day as needed 12/24/16   Cammie Sickle, MD  tiZANidine (ZANAFLEX) 4 MG tablet TAKE 1 TABLET BY MOUTH EVERY 8 HOURS AS NEEDED, MUSCLE RELAXER FOR BACK PAIN 05/02/17   Glean Hess, MD  traZODone (DESYREL) 50 MG tablet Take by mouth. 05/30/16   [provider]  zaleplon (SONATA) 10 MG capsule TAKE 1 CAPSULE BY MOUTH EVERY DAY AT BEDTIME AS NEEDED FOR SLEEP 02/07/17   Glean Hess, MD  Zoledronic Acid (ZOMETA) 4 MG/100ML IVPB Inject into the vein.    [provider]    Allergies as of 05/02/2017 - Review Complete 05/01/2017  Allergen Reaction Noted  . Oxycodone-acetaminophen  09/01/2011  . Benadryl [diphenhydramine] Palpitations 04/02/2016  . Morphine Itching and Rash 04/02/2016  . Ondansetron Diarrhea 09/01/2011  . Tylenol [acetaminophen] Itching and Rash 04/02/2016    Family History  Problem Relation Age of Onset  . Colon cancer Father   . Renal Disease Father   . Diabetes Mellitus II Father   . Melanoma Paternal Grandmother   . Breast cancer Maternal Aunt   . Anemia Mother   . Heart disease Mother   . Heart failure Mother   . Renal Disease Mother   . Congestive Heart Failure Mother   . Heart disease Maternal Uncle   . Throat cancer Maternal Uncle   . Lung cancer Maternal Uncle   . Liver disease Maternal Uncle   . Heart failure Maternal Uncle   . Hearing loss Son 21       Suicide     Social History   Socioeconomic History  . Marital status: Widowed    Spouse name: Not on file  . Number of children: Not on file  . Years of education: Not on file  . Highest education level: Not on file  Social Needs  . Financial resource strain: Not on file  . Food insecurity - worry: Not on file  . Food insecurity - inability: Not on file  . Transportation needs - medical: Not on file  . Transportation needs - non-medical: Not on file   Occupational History  . Occupation: Disabled  Tobacco Use  . Smoking status: Former Smoker    Packs/day: 1.00    Years: 20.00    Pack years: 20.00    Types: Cigarettes    Last attempt to quit: 07/02/1991    Years since quitting: 25.8  . Smokeless tobacco: Never Used  Substance and Sexual Activity  . Alcohol use: Yes    Alcohol/week: 1.2 oz    Types: 1 Glasses of wine, 1 Cans of beer per week    Comment: occassional  . Drug use: No  . Sexual  activity: No  Other Topics Concern  . Not on file  Social History Narrative  . Not on file    Review of Systems: See HPI, otherwise negative ROS  Physical Exam: Ht 5' 2.5" (1.588 m)   Wt 148 lb (67.1 kg)   BMI 26.64 kg/m  General:   Alert,  pleasant and cooperative in NAD Head:  Normocephalic and atraumatic. Neck:  Supple; no masses or thyromegaly. Lungs:  Clear throughout to auscultation, normal respiratory effort.    Heart:  +S1, +S2, Regular rate and rhythm, No edema. Abdomen:  Soft, nontender and nondistended. Normal bowel sounds, without guarding, and without rebound.   Neurologic:  Alert and  oriented x4;  grossly normal neurologically.  Impression/Plan: Sarah [name] is here for an endoscopy and  colonoscopy being performed for evaluation of GERD and abnormal CT scan     Risks, benefits, limitations, and alternatives regarding endoscopy have been reviewed with the patient.  Questions have been answered.  All parties agreeable.   [name], MD  05/07/2017, 1:14 PM

## 2017-05-07 NOTE — Transfer of Care (Signed)
Immediate Anesthesia Transfer of Care Note  Patient: Sarah [name]  Procedure(s) Performed: ESOPHAGOGASTRODUODENOSCOPY (EGD) WITH PROPOFOL (N/A ) COLONOSCOPY WITH PROPOFOL (N/A )  Patient Location: PACU  Anesthesia Type:General  Level of Consciousness: awake and alert   Airway & Oxygen Therapy: Patient Spontanous Breathing and Patient connected to nasal cannula oxygen  Post-op Assessment: Report given to RN and Post -op Vital signs reviewed and stable  Post vital signs: Reviewed and stable  Last Vitals:  Vitals:   05/07/17 1335  BP: 130/71  Pulse: 94  Resp: 16  Temp: (!) 36.3 C    Last Pain: There were no vitals filed for this visit.       Complications: No apparent anesthesia complications

## 2017-05-07 NOTE — Anesthesia Post-op Follow-up Note (Signed)
Anesthesia QCDR form completed.        

## 2017-05-07 NOTE — Op Note (Signed)
[facility] Patient Name: Sarah [name] Procedure Date: 05/07/2017 2:35 PM MRN: [id number] Account #: [id number] Date of Birth: [birth year] Admit Type: Ambulatory Age: 61 Room: [facility] ENDO ROOM 1 Gender: Female Note Status: Finalized Procedure:            Colonoscopy Indications:          Abnormal CT of the GI tract Providers:            [name] MD, MD Medicines:            Monitored Anesthesia Care Complications:        No immediate complications. Procedure:            Pre-Anesthesia Assessment:                       - Prior to the procedure, a History and Physical was                        performed, and patient medications, allergies and                        sensitivities were reviewed. The patient's tolerance of                        previous anesthesia was reviewed.                       - The risks and benefits of the procedure and the                        sedation options and risks were discussed with the                        patient. All questions were answered and informed                        consent was obtained.                       - ASA Grade Assessment: III - A patient with severe                        systemic disease.                       After obtaining informed consent, the colonoscope was                        passed under direct vision. Throughout the procedure,                        the patient's blood pressure, pulse, and oxygen                        saturations were monitored continuously. The                        Colonoscope was introduced through the anus and                        advanced to the the cecum, identified by the  appendiceal orifice, IC valve and transillumination.                        The colonoscopy was performed with ease. The patient                        tolerated the procedure well. The quality of the bowel                        preparation was  good. Findings:      The entire examined colon appeared normal on direct and retroflexion       views. Impression:           - The entire examined colon is normal on direct and                        retroflexion views.                       - No specimens collected. Recommendation:       - Discharge patient to home (with escort).                       - Resume previous diet.                       - Continue present medications. Procedure Code(s):    --- Professional ---                       [phone], Colonoscopy, flexible; diagnostic, including                        collection of specimen(s) by brushing or washing, when                        performed (separate procedure) Diagnosis Code(s):    --- Professional ---                       R93.3, Abnormal findings on diagnostic imaging of other                        parts of digestive tract CPT copyright 2016 American Medical Association. All rights reserved. The codes documented in this report are preliminary and upon coder review may  be revised to meet current compliance requirements. [name], MD [name] MD, MD 05/07/2017 2:56:15 PM This report has been signed electronically. Number of Addenda: 0 Note Initiated On: 05/07/2017 2:35 PM Scope Withdrawal Time: 0 hours 9 minutes 13 seconds  Total Procedure Duration: 0 hours 17 minutes 39 seconds       [facility]

## 2017-05-07 NOTE — Op Note (Signed)
[facility] Patient Name: Sarah [name] Procedure Date: 05/07/2017 2:24 PM MRN: [id number] Account #: [id number] Date of Birth: 1956/03/29 Admit Type: Ambulatory Age: 61 Room: [facility] ENDO ROOM 1 Gender: Female Note Status: Finalized Procedure:            Upper GI endoscopy Indications:          Esophageal reflux Providers:            [name] MD, MD Medicines:            Monitored Anesthesia Care Complications:        No immediate complications. Procedure:            Pre-Anesthesia Assessment:                       - Prior to the procedure, a History and Physical was                        performed, and patient medications, allergies and                        sensitivities were reviewed. The patient's tolerance of                        previous anesthesia was reviewed.                       - The risks and benefits of the procedure and the                        sedation options and risks were discussed with the                        patient. All questions were answered and informed                        consent was obtained.                       - ASA Grade Assessment: III - A patient with severe                        systemic disease.                       After obtaining informed consent, the endoscope was                        passed under direct vision. Throughout the procedure,                        the patient's blood pressure, pulse, and oxygen                        saturations were monitored continuously. The                        Colonoscope was introduced through the mouth, and                        advanced to the third part of duodenum. The upper GI  endoscopy was accomplished with ease. The patient                        tolerated the procedure well. Findings:      The examined duodenum was normal.      The esophagus was normal.      Patchy moderate inflammation characterized by congestion  (edema) and       erythema was found at the incisura, in the gastric antrum and in the       prepyloric region of the stomach. Biopsies were taken with a cold       forceps for histology.      The cardia and gastric fundus were normal on retroflexion. Impression:           - Normal examined duodenum.                       - Normal esophagus.                       - Gastritis. Biopsied. Recommendation:       - Await pathology results.                       - Perform a colonoscopy today. Procedure Code(s):    --- Professional ---                       [phone], Esophagogastroduodenoscopy, flexible, transoral;                        with biopsy, single or multiple Diagnosis Code(s):    --- Professional ---                       K29.70, Gastritis, unspecified, without bleeding                       K21.9, Gastro-esophageal reflux disease without                        esophagitis CPT copyright 2016 American Medical Association. All rights reserved. The codes documented in this report are preliminary and upon coder review may  be revised to meet current compliance requirements. [name], MD [name] MD, MD 05/07/2017 2:35:20 PM This report has been signed electronically. Number of Addenda: 0 Note Initiated On: 05/07/2017 2:24 PM      [facility]

## 2017-05-08 ENCOUNTER — Encounter: Payer: Self-pay | Admitting: Gastroenterology

## 2017-05-08 LAB — MULTIPLE MYELOMA PANEL, SERUM
ALBUMIN/GLOB SERPL: 1.5 (ref 0.7–1.7)
Albumin SerPl Elph-Mcnc: 4.1 g/dL (ref 2.9–4.4)
Alpha 1: 0.3 g/dL (ref 0.0–0.4)
Alpha2 Glob SerPl Elph-Mcnc: 0.9 g/dL (ref 0.4–1.0)
B-Globulin SerPl Elph-Mcnc: 1.1 g/dL (ref 0.7–1.3)
Gamma Glob SerPl Elph-Mcnc: 0.6 g/dL (ref 0.4–1.8)
Globulin, Total: 2.9 g/dL (ref 2.2–3.9)
IGA: 153 mg/dL (ref 87–352)
IGG (IMMUNOGLOBIN G), SERUM: 583 mg/dL — AB (ref 700–1600)
IGM (IMMUNOGLOBULIN M), SRM: 33 mg/dL (ref 26–217)
TOTAL PROTEIN ELP: 7 g/dL (ref 6.0–8.5)

## 2017-05-09 LAB — SURGICAL PATHOLOGY

## 2017-05-11 ENCOUNTER — Encounter: Payer: Self-pay | Admitting: Gastroenterology

## 2017-05-12 ENCOUNTER — Other Ambulatory Visit: Payer: Self-pay | Admitting: Internal Medicine

## 2017-05-12 DIAGNOSIS — C9001 Multiple myeloma in remission: Secondary | ICD-10-CM

## 2017-05-13 ENCOUNTER — Inpatient Hospital Stay (HOSPITAL_BASED_OUTPATIENT_CLINIC_OR_DEPARTMENT_OTHER): Payer: Managed Care, Other (non HMO) | Admitting: Internal Medicine

## 2017-05-13 ENCOUNTER — Inpatient Hospital Stay: Payer: Managed Care, Other (non HMO)

## 2017-05-13 ENCOUNTER — Other Ambulatory Visit: Payer: Self-pay | Admitting: Internal Medicine

## 2017-05-13 ENCOUNTER — Encounter: Payer: Self-pay | Admitting: Internal Medicine

## 2017-05-13 VITALS — BP 120/74 | HR 77 | Temp 97.4°F | Resp 16 | Wt 149.6 lb

## 2017-05-13 DIAGNOSIS — N281 Cyst of kidney, acquired: Secondary | ICD-10-CM

## 2017-05-13 DIAGNOSIS — Z1231 Encounter for screening mammogram for malignant neoplasm of breast: Secondary | ICD-10-CM

## 2017-05-13 DIAGNOSIS — F419 Anxiety disorder, unspecified: Secondary | ICD-10-CM

## 2017-05-13 DIAGNOSIS — I129 Hypertensive chronic kidney disease with stage 1 through stage 4 chronic kidney disease, or unspecified chronic kidney disease: Secondary | ICD-10-CM

## 2017-05-13 DIAGNOSIS — K5900 Constipation, unspecified: Secondary | ICD-10-CM

## 2017-05-13 DIAGNOSIS — N189 Chronic kidney disease, unspecified: Secondary | ICD-10-CM

## 2017-05-13 DIAGNOSIS — G629 Polyneuropathy, unspecified: Secondary | ICD-10-CM

## 2017-05-13 DIAGNOSIS — M549 Dorsalgia, unspecified: Secondary | ICD-10-CM | POA: Diagnosis not present

## 2017-05-13 DIAGNOSIS — M79601 Pain in right arm: Secondary | ICD-10-CM | POA: Diagnosis not present

## 2017-05-13 DIAGNOSIS — E119 Type 2 diabetes mellitus without complications: Secondary | ICD-10-CM

## 2017-05-13 DIAGNOSIS — C9001 Multiple myeloma in remission: Secondary | ICD-10-CM

## 2017-05-13 DIAGNOSIS — F329 Major depressive disorder, single episode, unspecified: Secondary | ICD-10-CM

## 2017-05-13 DIAGNOSIS — D500 Iron deficiency anemia secondary to blood loss (chronic): Secondary | ICD-10-CM | POA: Insufficient documentation

## 2017-05-13 DIAGNOSIS — K219 Gastro-esophageal reflux disease without esophagitis: Secondary | ICD-10-CM

## 2017-05-13 DIAGNOSIS — N183 Chronic kidney disease, stage 3 (moderate): Secondary | ICD-10-CM

## 2017-05-13 DIAGNOSIS — Z87891 Personal history of nicotine dependence: Secondary | ICD-10-CM

## 2017-05-13 DIAGNOSIS — Z801 Family history of malignant neoplasm of trachea, bronchus and lung: Secondary | ICD-10-CM

## 2017-05-13 DIAGNOSIS — R5383 Other fatigue: Secondary | ICD-10-CM

## 2017-05-13 DIAGNOSIS — M109 Gout, unspecified: Secondary | ICD-10-CM

## 2017-05-13 DIAGNOSIS — R110 Nausea: Secondary | ICD-10-CM

## 2017-05-13 DIAGNOSIS — Z79899 Other long term (current) drug therapy: Secondary | ICD-10-CM

## 2017-05-13 DIAGNOSIS — Z800 Family history of malignant neoplasm of digestive organs: Secondary | ICD-10-CM

## 2017-05-13 DIAGNOSIS — I509 Heart failure, unspecified: Secondary | ICD-10-CM

## 2017-05-13 DIAGNOSIS — Q231 Congenital insufficiency of aortic valve: Secondary | ICD-10-CM

## 2017-05-13 DIAGNOSIS — R197 Diarrhea, unspecified: Secondary | ICD-10-CM

## 2017-05-13 DIAGNOSIS — Z9481 Bone marrow transplant status: Secondary | ICD-10-CM

## 2017-05-13 LAB — CBC WITH DIFFERENTIAL/PLATELET
BASOS ABS: 0 10*3/uL (ref 0–0.1)
BASOS PCT: 1 %
EOS ABS: 0.1 10*3/uL (ref 0–0.7)
EOS PCT: 2 %
HEMATOCRIT: 34.5 % — AB (ref 35.0–47.0)
Hemoglobin: 11.8 g/dL — ABNORMAL LOW (ref 12.0–16.0)
Lymphocytes Relative: 18 %
Lymphs Abs: 0.8 10*3/uL — ABNORMAL LOW (ref 1.0–3.6)
MCH: 29.8 pg (ref 26.0–34.0)
MCHC: 34.2 g/dL (ref 32.0–36.0)
MCV: 87.2 fL (ref 80.0–100.0)
MONO ABS: 0.4 10*3/uL (ref 0.2–0.9)
MONOS PCT: 10 %
Neutro Abs: 3 10*3/uL (ref 1.4–6.5)
Neutrophils Relative %: 69 %
PLATELETS: 135 10*3/uL — AB (ref 150–440)
RBC: 3.95 MIL/uL (ref 3.80–5.20)
RDW: 14.4 % (ref 11.5–14.5)
WBC: 4.4 10*3/uL (ref 3.6–11.0)

## 2017-05-13 LAB — MAGNESIUM: Magnesium: 1.3 mg/dL — ABNORMAL LOW (ref 1.7–2.4)

## 2017-05-13 LAB — COMPREHENSIVE METABOLIC PANEL
ALBUMIN: 4.4 g/dL (ref 3.5–5.0)
ALK PHOS: 91 U/L (ref 38–126)
ALT: 23 U/L (ref 14–54)
AST: 30 U/L (ref 15–41)
Anion gap: 9 (ref 5–15)
BILIRUBIN TOTAL: 0.4 mg/dL (ref 0.3–1.2)
BUN: 31 mg/dL — ABNORMAL HIGH (ref 6–20)
CALCIUM: 9.2 mg/dL (ref 8.9–10.3)
CO2: 24 mmol/L (ref 22–32)
CREATININE: 1.17 mg/dL — AB (ref 0.44–1.00)
Chloride: 102 mmol/L (ref 101–111)
GFR calc Af Amer: 57 mL/min — ABNORMAL LOW (ref >60)
GFR calc non Af Amer: 50 mL/min — ABNORMAL LOW (ref >60)
GLUCOSE: 122 mg/dL — AB (ref 65–99)
Potassium: 4.2 mmol/L (ref 3.5–5.1)
SODIUM: 135 mmol/L (ref 135–145)
TOTAL PROTEIN: 7.2 g/dL (ref 6.5–8.1)

## 2017-05-13 MED ORDER — MAGNESIUM SULFATE 4 GM/100ML IV SOLN
4.0000 g | Freq: Once | INTRAVENOUS | Status: AC
  Administered 2017-05-13: 4 g via INTRAVENOUS
  Filled 2017-05-13: qty 100

## 2017-05-13 MED ORDER — SODIUM CHLORIDE 0.9 % IV SOLN
Freq: Once | INTRAVENOUS | Status: AC
  Administered 2017-05-13: 10:00:00 via INTRAVENOUS
  Filled 2017-05-13: qty 1000

## 2017-05-13 NOTE — Progress Notes (Signed)
[facility] CONSULT NOTE  Patient Care Team: Berglund, Laura H, MD as PCP - General (Internal Medicine) Brahmanday, Govinda R, MD as Consulting Physician (Oncology) James, Dominika L, MD as Referring Physician (Pain Medicine) Kowalski, Bruce J, MD as Consulting Physician (Cardiology)  CHIEF COMPLAINTS/PURPOSE OF CONSULTATION:  Multiple myeloma  # MULTIPLE MYELOMA-   Oncology History   # June 2017- [facility] MULTIPLE MYELOMA [BMBx- 80% plasma cells; Dr.R Kosfield; [facility]]; Lamda light chain 1340; s/p RVD   # 14th DEC 2016- Auto-Stem cell transplant [[facility];Dr.Hertzig]; rev [dose reduced sec to Diarrhea]  # Maintenance Revlimid- 5mg 3 w-ON & 1 week OFF; HELD July 2018- sec to ONJ  # Bone lesions-numerous ~5mm lytic lesions in Thoracic spine- on Zometa  # 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  female of her history of multiple myeloma diagnosed June 2016 currently on Surveillance [ since July 2018 maintenance Revlimid on hold]  Is here for follow-up.  Patient had oral surgery at [facility] for osteonecrosis of the jaw.  She states to be healing well.  In the interim patient also had a colonoscopy and EGD.  EGD showed mild inflammation in the stomach.  Patient continues to have mild to moderate fatigue.  She denies any ongoing diarrhea constipation.  She does admit to easy "stomach upset".  Denies any headaches.  Denies any tingling or numbness.  Patient complains of right arm pain-ever since she got a pneumonia injection approximately 2 months ago.  Is not getting any better effect getting worse.  No new pain anywhere else.  ROS: A complete 10 point  review of system is done which is negative except mentioned above in history of present illness  MEDICAL HISTORY:  Past Medical History:  Diagnosis Date  . Anemia   . Anxiety   . Arthritis   . Bicuspid aortic valve   . CHF (congestive heart failure) (HCC)   . CKD (chronic kidney disease) stage 3, GFR 30-59 ml/min (HCC)   . Depression   . Diabetes mellitus (HCC)   . Dizziness   . Fatty liver   . Frequent falls   . GERD (gastroesophageal reflux disease)   . Gout   . Heart murmur   . History of blood transfusion   . History of bone marrow transplant (HCC)   . History of uterine fibroid   . Hypertension   . Hypomagnesemia   . Multiple myeloma (HCC)   . Renal cyst     SURGICAL HISTORY: Past Surgical History:  Procedure Laterality Date  . 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 CARDIAC ELECTROPHYSIOLOGY MAPPING AND ABLATION    . CARPAL TUNNEL RELEASE Bilateral   . CHOLECYSTECTOMY  2008  . FOOT SURGERY Bilateral   . INCONTINENCE SURGERY  2009  . PARTIAL HYSTERECTOMY  03/1996   fibroids  . TONSILLECTOMY  2007   SOCIAL HISTORY: moved from [place]; widowed in June 2017; quit smoking 93; occasional alcohol; lives [place] with step sister.  Social History   Socioeconomic History  . Marital status: Widowed    Spouse name: Not on file  . Number of children: Not on file  . Years of education: Not on   file  . Highest education level: Not on file  Social Needs  . Financial resource strain: Not on file  . Food insecurity - worry: Not on file  . Food insecurity - inability: Not on file  . Transportation needs - medical: Not on file  . Transportation needs - non-medical: Not on file  Occupational History  . Occupation: Disabled  Tobacco Use  . Smoking status: Former Smoker    Packs/day: 1.00    Years: 20.00    Pack years: 20.00    Types: Cigarettes    Last attempt to quit: 07/02/1991    Years since quitting: 25.8  . Smokeless tobacco: Never Used  Substance and Sexual  Activity  . Alcohol use: Yes    Alcohol/week: 1.2 oz    Types: 1 Glasses of wine, 1 Cans of beer per week    Comment: occassional  . Drug use: No  . Sexual activity: No  Other Topics Concern  . Not on file  Social History Narrative  . Not on file    FAMILY HISTORY: Family History  Problem Relation Age of Onset  . Colon cancer Father   . Renal Disease Father   . Diabetes Mellitus II Father   . Melanoma Paternal Grandmother   . Breast cancer Maternal Aunt   . Anemia Mother   . Heart disease Mother   . Heart failure Mother   . Renal Disease Mother   . Congestive Heart Failure Mother   . Heart disease Maternal Uncle   . Throat cancer Maternal Uncle   . Lung cancer Maternal Uncle   . Liver disease Maternal Uncle   . Heart failure Maternal Uncle   . Hearing loss Son 18       Suicide     ALLERGIES:  is allergic to oxycodone-acetaminophen; benadryl [diphenhydramine]; morphine; ondansetron; and tylenol [acetaminophen].  MEDICATIONS:  Current Outpatient Medications  Medication Sig Dispense Refill  . acyclovir (ZOVIRAX) 200 MG capsule Take 2 capsules (400 mg total) by mouth 3 (three) times daily. 180 capsule 5  . allopurinol (ZYLOPRIM) 100 MG tablet TAKE 1 TABLET(100 MG) BY MOUTH DAILY 30 tablet 0  . amoxicillin-clavulanate (AUGMENTIN) 500-125 MG tablet TK 1 T PO BID UNTIL GONE  0  . Biotin 1 MG CAPS Take by mouth.    . bisoprolol (ZEBETA) 10 MG tablet TAKE 1 TABLET(10 MG) BY MOUTH DAILY 30 tablet 0  . Calcium Carb-Cholecalciferol (CALCIUM CARBONATE-VITAMIN D3 PO) Take by mouth.    . Calcium Carbonate-Vit D-Min (CALCIUM 600+D PLUS MINERALS) 600-400 MG-UNIT TABS Take 1 tablet by mouth daily.     . cholestyramine (QUESTRAN) 4 g packet Take 4 g once daily. 60 each 3  . diphenoxylate-atropine (LOMOTIL) 2.5-0.025 MG tablet Take 2 tablets by mouth daily as needed for diarrhea or loose stools. 30 tablet 2  . DULoxetine (CYMBALTA) 60 MG capsule TAKE 1 CAPSULE(60 MG) BY MOUTH TWICE DAILY  60 capsule 0  . fentaNYL (DURAGESIC - DOSED MCG/HR) 25 MCG/HR patch Place 25 mcg onto the skin every 3 (three) days.    . fexofenadine (ALLEGRA) 180 MG tablet Take 1 tablet (180 mg total) by mouth daily. 30 tablet 1  . fluticasone (FLONASE) 50 MCG/ACT nasal spray Place 2 sprays into both nostrils daily. 16 g 0  . furosemide (LASIX) 20 MG tablet Take 0.5 tablets (10 mg total) by mouth daily as needed. 30 tablet 0  . glucose blood (ONE TOUCH ULTRA TEST) test strip     . HYDROcodone-ibuprofen (VICOPROFEN)   7.5-200 MG tablet Take by mouth.    . hydrOXYzine (ATARAX/VISTARIL) 10 MG tablet TAKE 1 TABLET(10 MG) BY MOUTH THREE TIMES DAILY AS NEEDED 90 tablet 0  . lenalidomide (REVLIMID) 5 MG capsule Take by mouth.    . montelukast (SINGULAIR) 10 MG tablet Take 1 tablet (10 mg total) by mouth at bedtime. 30 tablet 5  . Multiple Vitamins-Minerals (HAIR/SKIN/NAILS/BIOTIN PO) Take 1 capsule by mouth 3 (three) times daily.    . omeprazole (PRILOSEC) 40 MG capsule Take 1 capsule (40 mg total) by mouth 2 (two) times daily. 60 capsule 5  . oxyCODONE-acetaminophen (PERCOCET/ROXICET) 5-325 MG tablet TK 1 T PO Q 6 H PRN P  0  . promethazine (PHENERGAN) 25 MG tablet 1-2 pills prior to meals twice a day as needed 60 tablet 4  . tiZANidine (ZANAFLEX) 4 MG tablet TAKE 1 TABLET BY MOUTH EVERY 8 HOURS AS NEEDED, MUSCLE RELAXER FOR BACK PAIN 90 tablet 0  . traZODone (DESYREL) 50 MG tablet Take by mouth.    . zaleplon (SONATA) 10 MG capsule TAKE 1 CAPSULE BY MOUTH EVERY DAY AT BEDTIME AS NEEDED FOR SLEEP 30 capsule 5  . Zoledronic Acid (ZOMETA) 4 MG/100ML IVPB Inject into the vein.     No current facility-administered medications for this visit.    Facility-Administered Medications Ordered in Other Visits  Medication Dose Route Frequency Provider Last Rate Last Dose  . 0.9 %  sodium chloride infusion   Intravenous Once Brahmanday, Govinda R, MD      . magnesium sulfate IVPB 4 g 100 mL  4 g Intravenous Once Brahmanday,  Govinda R, MD        PHYSICAL EXAMINATION: ECOG PERFORMANCE STATUS: 1 - Symptomatic but completely ambulatory  Vitals:   05/13/17 0909  BP: 120/74  Pulse: 77  Resp: 16  Temp: (!) 97.4 F (36.3 C)   Filed Weights   05/13/17 0909  Weight: 149 lb 9.3 oz (67.8 kg)    GENERAL: Well-nourished well-developed; Alert, no distress and comfortable.  EYES: no pallor or icterus OROPHARYNX: no thrush;  NECK: supple, no masses felt LYMPH:  no palpable lymphadenopathy in the cervical, axillary or inguinal regions LUNGS: clear to auscultation and  No wheeze or crackles HEART/CVS: regular rate & rhythm and no murmurs; No lower extremity edema ABDOMEN: abdomen soft, non-tender and normal bowel sounds Musculoskeletal:no cyanosis of digits and no clubbing  PSYCH: alert & oriented x 3 with fluent speech NEURO: no focal motor/sensory deficits SKIN:  no rashes or significant lesions  LABORATORY DATA:  I have reviewed the data as listed Lab Results  Component Value Date   WBC 4.4 05/13/2017   HGB 11.8 (L) 05/13/2017   HCT 34.5 (L) 05/13/2017   MCV 87.2 05/13/2017   PLT 135 (L) 05/13/2017   Recent Labs    03/18/17 0820 04/15/17 0819 05/06/17 0842 05/13/17 0846  NA 137 137 139 135  K 4.1 4.4 4.4 4.2  CL 104 105 103 102  CO2 24 24 26 24  GLUCOSE 136* 103* 137* 122*  BUN 35* 31* 18 31*  CREATININE 1.10* 1.18* 1.13* 1.17*  CALCIUM 9.3 8.9 9.2 9.2  GFRNONAA 53* 49* 52* 50*  GFRAA >60 57* 60* 57*  PROT 7.6  --  7.9 7.2  ALBUMIN 4.4  --  4.6 4.4  AST 26  --  33 30  ALT 25  --  25 23  ALKPHOS 100  --  101 91  BILITOT 0.5  --  0.4 0.4      Results for ALAJIA, SCHMELZER (MRN [id number]) as of 05/13/2017 09:40  Ref. Range 06/18/2016 11:15 09/24/2016 10:06 12/24/2016 09:37 03/18/2017 08:20 05/06/2017 08:42  Kappa free light chain Latest Ref Range: 3.3 - 19.4 mg/L 17.1 12.5 15.1 16.6 15.1  Lamda free light chains Latest Ref Range: 5.7 - 26.3 mg/L 15.8 14.6 19.3 20.0 21.0  Kappa, lamda light  chain ratio Latest Ref Range: 0.26 - 1.65  1.08 0.86 0.78 0.83 0.72  M Protein SerPl Elph-Mcnc Latest Ref Range: Not Observed g/dL _0       RADIOGRAPHIC STUDIES: I have personally reviewed the radiological images as listed and agreed with the findings in the report. No results found.  ASSESSMENT & PLAN:   Multiple myeloma in remission ([place]) Multiple myeloma IgA lambda status post autologous stem cell transplant in December 2016 [[facility]].  HELD maintenance revlimid since July 2018 sec to ONJ  # NOV 2018- serum M protein absent/K-L light chains- normal. Clinically no evidence of progression at this time. Continue to HOLD Revlimid for now.  # ONJ- AUG 2018-[oral surgery at [facility]. I would recommend discontinuation of Zometa at this time. Patient takes calcium and vitamin d currently.   # Right arm pain s/p Pneumonia shot ~ 2 months ago. Get skleletal survey today.   # Fatigue-? IDA/CKD- Iron sat- 15%; IV venofer weekly x4.   # Nausea chronic on Phenergan 25 mg prn.  Reviewed recent EGD colonoscopy.  # Chronic diarrhea alternating with constipation-  Off Revlimid and symptoms have improved but not resolved. Colestyramine prn per GI according to pt.    #  Hypomagnesemia- Mg 1.3 today. IV mag today and continue with weekly lab monitoring and replenishment as needed. Intolerance to oral magnesium as it worsened her diarrhea.    # Chronic back pain- pain management follow-up; on fentanyl  25 mc/ [Dr.James; [facility]; [place]]; no BTP.   # follow up in 2 months/myeloma panel; k-l light chains; IV venofer weekly x4; weekly mag/infusion.    Cammie Sickle, MD 05/13/2017 10:02 AM

## 2017-05-13 NOTE — Assessment & Plan Note (Addendum)
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  HELD maintenance revlimid since July 2018 sec to ONJ  # NOV 2018- serum M protein absent/K-L light chains- normal. Clinically no evidence of progression at this time. Continue to HOLD Revlimid for now.  # ONJ- AUG 2018-[oral surgery at [place]. I would recommend discontinuation of Zometa at this time. Patient takes calcium and vitamin d currently.   # Right arm pain s/p Pneumonia shot ~ 2 months ago. Get skleletal survey today.   # Fatigue-? IDA/CKD- Iron sat- 15%; IV venofer weekly x4.   # Nausea chronic on Phenergan 25 mg prn.  Reviewed recent EGD colonoscopy.  # Chronic diarrhea alternating with constipation-  Off Revlimid and symptoms have improved but not resolved. Colestyramine prn per GI according to pt.    #  Hypomagnesemia- Mg 1.3 today. IV mag today and continue with weekly lab monitoring and replenishment as needed. Intolerance to oral magnesium as it worsened her diarrhea.    # Chronic back pain- pain management follow-up; on fentanyl  25 mc/ [Dr.James; [facility]; [place]]; no BTP.   # follow up in 2 months/myeloma panel; k-l light chains; IV venofer weekly x4; weekly mag/infusion.

## 2017-05-13 NOTE — Patient Instructions (Signed)
Magnesium Sulfate injection What is this medicine? MAGNESIUM SULFATE (mag NEE zee um SUL fate) is an electrolyte injection commonly used to treat low magnesium levels in your blood. It is also used to prevent or control seizures in women with preeclampsia or eclampsia. This medicine may be used for other purposes; ask your health care provider or pharmacist if you have questions. What should I tell my health care provider before I take this medicine? They need to know if you have any of these conditions: -heart disease -history of irregular heart beat -kidney disease -an unusual or allergic reaction to magnesium sulfate, medicines, foods, dyes, or preservatives -pregnant or trying to get pregnant -breast-feeding How should I use this medicine? This medicine is for infusion into a vein. It is given by a health care professional in a hospital or clinic setting. Talk to your pediatrician regarding the use of this medicine in children. While this drug may be prescribed for selected conditions, precautions do apply. Overdosage: If you think you have taken too much of this medicine contact a poison control center or emergency room at once. NOTE: This medicine is only for you. Do not share this medicine with others. What if I miss a dose? This does not apply. What may interact with this medicine? This medicine may interact with the following medications: -certain medicines for anxiety or sleep -certain medicines for seizures like phenobarbital -digoxin -medicines that relax muscles for surgery -narcotic medicines for pain This list may not describe all possible interactions. Give your health care provider a list of all the medicines, herbs, non-prescription drugs, or dietary supplements you use. Also tell them if you smoke, drink alcohol, or use illegal drugs. Some items may interact with your medicine. What should I watch for while using this medicine? Your condition will be monitored carefully  while you are receiving this medicine. You may need blood work done while you are receiving this medicine. What side effects may I notice from receiving this medicine? Side effects that you should report to your doctor or health care professional as soon as possible: -allergic reactions like skin rash, itching or hives, swelling of the face, lips, or tongue -facial flushing -muscle weakness -signs and symptoms of low blood pressure like dizziness; feeling faint or lightheaded, falls; unusually weak or tired -signs and symptoms of a dangerous change in heartbeat or heart rhythm like chest pain; dizziness; fast or irregular heartbeat; palpitations; breathing problems -sweating This list may not describe all possible side effects. Call your doctor for medical advice about side effects. You may report side effects to FDA at 1-800-FDA-1088. Where should I keep my medicine? This drug is given in a hospital or clinic and will not be stored at home. NOTE: This sheet is a summary. It may not cover all possible information. If you have questions about this medicine, talk to your doctor, pharmacist, or health care provider.  2018 Elsevier/Gold Standard (2016-01-03 12:31:42)  

## 2017-05-14 LAB — MULTIPLE MYELOMA PANEL, SERUM
ALBUMIN SERPL ELPH-MCNC: 4 g/dL (ref 2.9–4.4)
ALPHA 1: 0.2 g/dL (ref 0.0–0.4)
Albumin/Glob SerPl: 1.4 (ref 0.7–1.7)
Alpha2 Glob SerPl Elph-Mcnc: 0.9 g/dL (ref 0.4–1.0)
B-Globulin SerPl Elph-Mcnc: 1.1 g/dL (ref 0.7–1.3)
Gamma Glob SerPl Elph-Mcnc: 0.6 g/dL (ref 0.4–1.8)
Globulin, Total: 2.9 g/dL (ref 2.2–3.9)
IGA: 145 mg/dL (ref 87–352)
IGM (IMMUNOGLOBULIN M), SRM: 32 mg/dL (ref 26–217)
IgG (Immunoglobin G), Serum: 564 mg/dL — ABNORMAL LOW (ref 700–1600)
Total Protein ELP: 6.9 g/dL (ref 6.0–8.5)

## 2017-05-14 LAB — KAPPA/LAMBDA LIGHT CHAINS
KAPPA FREE LGHT CHN: 15.4 mg/L (ref 3.3–19.4)
KAPPA, LAMDA LIGHT CHAIN RATIO: 0.74 (ref 0.26–1.65)
LAMDA FREE LIGHT CHAINS: 20.9 mg/L (ref 5.7–26.3)

## 2017-05-20 ENCOUNTER — Inpatient Hospital Stay: Payer: Managed Care, Other (non HMO)

## 2017-05-20 VITALS — BP 146/80 | HR 52 | Resp 16

## 2017-05-20 DIAGNOSIS — C9001 Multiple myeloma in remission: Secondary | ICD-10-CM | POA: Diagnosis not present

## 2017-05-20 LAB — MAGNESIUM: MAGNESIUM: 1.3 mg/dL — AB (ref 1.7–2.4)

## 2017-05-20 MED ORDER — IRON SUCROSE 20 MG/ML IV SOLN
200.0000 mg | Freq: Once | INTRAVENOUS | Status: AC
  Administered 2017-05-20: 200 mg via INTRAVENOUS
  Filled 2017-05-20: qty 10

## 2017-05-20 MED ORDER — SODIUM CHLORIDE 0.9 % IV SOLN
Freq: Once | INTRAVENOUS | Status: AC
  Administered 2017-05-20: 09:00:00 via INTRAVENOUS
  Filled 2017-05-20: qty 1000

## 2017-05-20 MED ORDER — MAGNESIUM SULFATE 4 GM/100ML IV SOLN
4.0000 g | Freq: Once | INTRAVENOUS | Status: AC
  Administered 2017-05-20: 4 g via INTRAVENOUS
  Filled 2017-05-20: qty 100

## 2017-05-20 MED ORDER — IRON SUCROSE 20 MG/ML IV SOLN
INTRAVENOUS | Status: AC
  Filled 2017-05-20: qty 10

## 2017-05-20 NOTE — Patient Instructions (Signed)
Magnesium Sulfate injection What is this medicine? MAGNESIUM SULFATE (mag NEE zee um SUL fate) is an electrolyte injection commonly used to treat low magnesium levels in your blood. It is also used to prevent or control seizures in women with preeclampsia or eclampsia. This medicine may be used for other purposes; ask your health care provider or pharmacist if you have questions. What should I tell my health care provider before I take this medicine? They need to know if you have any of these conditions: -heart disease -history of irregular heart beat -kidney disease -an unusual or allergic reaction to magnesium sulfate, medicines, foods, dyes, or preservatives -pregnant or trying to get pregnant -breast-feeding How should I use this medicine? This medicine is for infusion into a vein. It is given by a health care professional in a hospital or clinic setting. Talk to your pediatrician regarding the use of this medicine in children. While this drug may be prescribed for selected conditions, precautions do apply. Overdosage: If you think you have taken too much of this medicine contact a poison control center or emergency room at once. NOTE: This medicine is only for you. Do not share this medicine with others. What if I miss a dose? This does not apply. What may interact with this medicine? This medicine may interact with the following medications: -certain medicines for anxiety or sleep -certain medicines for seizures like phenobarbital -digoxin -medicines that relax muscles for surgery -narcotic medicines for pain This list may not describe all possible interactions. Give your health care provider a list of all the medicines, herbs, non-prescription drugs, or dietary supplements you use. Also tell them if you smoke, drink alcohol, or use illegal drugs. Some items may interact with your medicine. What should I watch for while using this medicine? Your condition will be monitored carefully  while you are receiving this medicine. You may need blood work done while you are receiving this medicine. What side effects may I notice from receiving this medicine? Side effects that you should report to your doctor or health care professional as soon as possible: -allergic reactions like skin rash, itching or hives, swelling of the face, lips, or tongue -facial flushing -muscle weakness -signs and symptoms of low blood pressure like dizziness; feeling faint or lightheaded, falls; unusually weak or tired -signs and symptoms of a dangerous change in heartbeat or heart rhythm like chest pain; dizziness; fast or irregular heartbeat; palpitations; breathing problems -sweating This list may not describe all possible side effects. Call your doctor for medical advice about side effects. You may report side effects to FDA at 1-800-FDA-1088. Where should I keep my medicine? This drug is given in a hospital or clinic and will not be stored at home. NOTE: This sheet is a summary. It may not cover all possible information. If you have questions about this medicine, talk to your doctor, pharmacist, or health care provider.  2018 [employer] Standard (2016-01-03 12:31:42) Iron Dextran injection What is this medicine? IRON DEXTRAN (AHY ern DEX tran) is an iron complex. Iron is used to make healthy red blood cells, which carry oxygen and nutrients through the body. This medicine is used to treat people who cannot take iron by mouth and have low levels of iron in the blood. This medicine may be used for other purposes; ask your health care provider or pharmacist if you have questions. COMMON BRAND NAME(S): Dexferrum, INFeD What should I tell my health care provider before I take this medicine? They need to know  if you have any of these conditions: -anemia not caused by low iron levels -heart disease -high levels of iron in the blood -kidney disease -liver disease -an unusual or allergic reaction to  iron, other medicines, foods, dyes, or preservatives -pregnant or trying to get pregnant -breast-feeding How should I use this medicine? This medicine is for injection into a vein or a muscle. It is given by a health care professional in a hospital or clinic setting. Talk to your pediatrician regarding the use of this medicine in children. While this drug may be prescribed for children as young as 53 months old for selected conditions, precautions do apply. Overdosage: If you think you have taken too much of this medicine contact a poison control center or emergency room at once. NOTE: This medicine is only for you. Do not share this medicine with others. What if I miss a dose? It is important not to miss your dose. Call your doctor or health care professional if you are unable to keep an appointment. What may interact with this medicine? Do not take this medicine with any of the following medications: -deferoxamine -dimercaprol -other iron products This medicine may also interact with the following medications: -chloramphenicol -deferasirox This list may not describe all possible interactions. Give your health care provider a list of all the medicines, herbs, non-prescription drugs, or dietary supplements you use. Also tell them if you smoke, drink alcohol, or use illegal drugs. Some items may interact with your medicine. What should I watch for while using this medicine? Visit your doctor or health care professional regularly. Tell your doctor if your symptoms do not start to get better or if they get worse. You may need blood work done while you are taking this medicine. You may need to follow a special diet. Talk to your doctor. Foods that contain iron include: whole grains/cereals, dried fruits, beans, or peas, leafy green vegetables, and organ meats (liver, kidney). Long-term use of this medicine may increase your risk of some cancers. Talk to your doctor about how to limit your  risk. What side effects may I notice from receiving this medicine? Side effects that you should report to your doctor or health care professional as soon as possible: -allergic reactions like skin rash, itching or hives, swelling of the face, lips, or tongue -blue lips, nails, or skin -breathing problems -changes in blood pressure -chest pain -confusion -fast, irregular heartbeat -feeling faint or lightheaded, falls -fever or chills -flushing, sweating, or hot feelings -joint or muscle aches or pains -pain, tingling, numbness in the hands or feet -seizures -unusually weak or tired Side effects that usually do not require medical attention (report to your doctor or health care professional if they continue or are bothersome): -change in taste (metallic taste) -diarrhea -headache -irritation at site where injected -nausea, vomiting -stomach upset This list may not describe all possible side effects. Call your doctor for medical advice about side effects. You may report side effects to FDA at 1-800-FDA-1088. Where should I keep my medicine? This drug is given in a hospital or clinic and will not be stored at home. NOTE: This sheet is a summary. It may not cover all possible information. If you have questions about this medicine, talk to your doctor, pharmacist, or health care provider.  2018 [employer]vier/Gold Standard (2007-11-03 16:59:50)

## 2017-05-27 ENCOUNTER — Inpatient Hospital Stay: Payer: Managed Care, Other (non HMO)

## 2017-05-27 VITALS — BP 119/74 | HR 81 | Temp 96.4°F | Resp 16

## 2017-05-27 DIAGNOSIS — C9001 Multiple myeloma in remission: Secondary | ICD-10-CM | POA: Diagnosis not present

## 2017-05-27 LAB — MAGNESIUM: Magnesium: 1.4 mg/dL — ABNORMAL LOW (ref 1.7–2.4)

## 2017-05-27 MED ORDER — SODIUM CHLORIDE 0.9 % IV SOLN
Freq: Once | INTRAVENOUS | Status: AC
  Administered 2017-05-27: 10:00:00 via INTRAVENOUS
  Filled 2017-05-27: qty 1000

## 2017-05-27 MED ORDER — MAGNESIUM SULFATE 4 GM/100ML IV SOLN
4.0000 g | Freq: Once | INTRAVENOUS | Status: AC
  Administered 2017-05-27: 4 g via INTRAVENOUS
  Filled 2017-05-27: qty 100

## 2017-05-28 ENCOUNTER — Ambulatory Visit
Admission: RE | Admit: 2017-05-28 | Discharge: 2017-05-28 | Disposition: A | Discharge status: Home / Self Care | Payer: Managed Care, Other (non HMO) | Source: Ambulatory Visit | Attending: Internal Medicine | Admitting: Internal Medicine

## 2017-05-28 DIAGNOSIS — Z1231 Encounter for screening mammogram for malignant neoplasm of breast: Secondary | ICD-10-CM

## 2017-05-28 DIAGNOSIS — C9001 Multiple myeloma in remission: Secondary | ICD-10-CM

## 2017-05-28 HISTORY — DX: Personal history of antineoplastic chemotherapy: Z92.21

## 2017-05-29 ENCOUNTER — Telehealth: Payer: Self-pay | Admitting: *Deleted

## 2017-05-29 ENCOUNTER — Encounter: Payer: Self-pay | Admitting: *Deleted

## 2017-05-29 NOTE — Telephone Encounter (Signed)
Left vm for patient to return my phone call. Also sending mychart msg to patient regarding test results

## 2017-05-29 NOTE — Telephone Encounter (Signed)
-----  Message from Cammie Sickle, MD sent at 05/28/2017  6:59 PM EST ----- Please inform patient that x-rays did not show any evidence of multiple myeloma in the bones.  Follow-up as planned

## 2017-05-30 ENCOUNTER — Other Ambulatory Visit: Payer: Self-pay | Admitting: Internal Medicine

## 2017-05-30 DIAGNOSIS — R42 Dizziness and giddiness: Secondary | ICD-10-CM

## 2017-06-02 ENCOUNTER — Telehealth: Payer: Self-pay | Admitting: *Deleted

## 2017-06-02 NOTE — Telephone Encounter (Signed)
MD received incoming fax from [facility] for patient's zolpidem ER. reveiwed chart- this drug was previously dx from Dr. Army Melia in Oct 2017. This is not an active rx from Dr. Rogue Bussing and not on med list as currently being prescribed. I personally contacted the patient's pharmacy and left vm for pharmacy to contact pcp regarding rx renewal.

## 2017-06-03 ENCOUNTER — Inpatient Hospital Stay: Payer: BLUE CROSS/BLUE SHIELD

## 2017-06-03 ENCOUNTER — Inpatient Hospital Stay: Payer: BLUE CROSS/BLUE SHIELD | Attending: Internal Medicine

## 2017-06-03 DIAGNOSIS — C9001 Multiple myeloma in remission: Secondary | ICD-10-CM

## 2017-06-03 DIAGNOSIS — C9000 Multiple myeloma not having achieved remission: Secondary | ICD-10-CM | POA: Insufficient documentation

## 2017-06-03 DIAGNOSIS — Z79899 Other long term (current) drug therapy: Secondary | ICD-10-CM | POA: Insufficient documentation

## 2017-06-03 DIAGNOSIS — Z23 Encounter for immunization: Secondary | ICD-10-CM | POA: Diagnosis not present

## 2017-06-03 LAB — MAGNESIUM: MAGNESIUM: 1.3 mg/dL — AB (ref 1.7–2.4)

## 2017-06-03 MED ORDER — SODIUM CHLORIDE 0.9 % IV SOLN
Freq: Once | INTRAVENOUS | Status: AC
  Administered 2017-06-03: 10:00:00 via INTRAVENOUS
  Filled 2017-06-03: qty 1000

## 2017-06-03 MED ORDER — MAGNESIUM SULFATE 4 GM/100ML IV SOLN
4.0000 g | Freq: Once | INTRAVENOUS | Status: AC
  Administered 2017-06-03: 4 g via INTRAVENOUS
  Filled 2017-06-03: qty 100

## 2017-06-03 MED ORDER — IRON SUCROSE 20 MG/ML IV SOLN
200.0000 mg | Freq: Once | INTRAVENOUS | Status: AC
  Administered 2017-06-03: 200 mg via INTRAVENOUS
  Filled 2017-06-03: qty 10

## 2017-06-03 MED ORDER — IRON SUCROSE 20 MG/ML IV SOLN
INTRAVENOUS | Status: AC
  Filled 2017-06-03: qty 10

## 2017-06-03 NOTE — Patient Instructions (Signed)

## 2017-06-10 ENCOUNTER — Inpatient Hospital Stay: Payer: BLUE CROSS/BLUE SHIELD

## 2017-06-17 ENCOUNTER — Inpatient Hospital Stay: Payer: BLUE CROSS/BLUE SHIELD

## 2017-06-17 VITALS — BP 149/85 | HR 73 | Resp 16

## 2017-06-17 DIAGNOSIS — C9001 Multiple myeloma in remission: Secondary | ICD-10-CM

## 2017-06-17 DIAGNOSIS — C9000 Multiple myeloma not having achieved remission: Secondary | ICD-10-CM | POA: Diagnosis not present

## 2017-06-17 LAB — MAGNESIUM: MAGNESIUM: 1.1 mg/dL — AB (ref 1.7–2.4)

## 2017-06-17 MED ORDER — SODIUM CHLORIDE 0.9 % IV SOLN
Freq: Once | INTRAVENOUS | Status: AC
  Administered 2017-06-17: 09:00:00 via INTRAVENOUS
  Filled 2017-06-17: qty 1000

## 2017-06-17 MED ORDER — MAGNESIUM SULFATE 4 GM/100ML IV SOLN
4.0000 g | Freq: Once | INTRAVENOUS | Status: AC
  Administered 2017-06-17: 4 g via INTRAVENOUS
  Filled 2017-06-17: qty 100

## 2017-06-17 NOTE — Patient Instructions (Signed)
Magnesium Sulfate injection What is this medicine? MAGNESIUM SULFATE (mag NEE zee um SUL fate) is an electrolyte injection commonly used to treat low magnesium levels in your blood. It is also used to prevent or control seizures in women with preeclampsia or eclampsia. This medicine may be used for other purposes; ask your health care provider or pharmacist if you have questions. What should I tell my health care provider before I take this medicine? They need to know if you have any of these conditions: -heart disease -history of irregular heart beat -kidney disease -an unusual or allergic reaction to magnesium sulfate, medicines, foods, dyes, or preservatives -pregnant or trying to get pregnant -breast-feeding How should I use this medicine? This medicine is for infusion into a vein. It is given by a health care professional in a hospital or clinic setting. Talk to your pediatrician regarding the use of this medicine in children. While this drug may be prescribed for selected conditions, precautions do apply. Overdosage: If you think you have taken too much of this medicine contact a poison control center or emergency room at once. NOTE: This medicine is only for you. Do not share this medicine with others. What if I miss a dose? This does not apply. What may interact with this medicine? This medicine may interact with the following medications: -certain medicines for anxiety or sleep -certain medicines for seizures like phenobarbital -digoxin -medicines that relax muscles for surgery -narcotic medicines for pain This list may not describe all possible interactions. Give your health care provider a list of all the medicines, herbs, non-prescription drugs, or dietary supplements you use. Also tell them if you smoke, drink alcohol, or use illegal drugs. Some items may interact with your medicine. What should I watch for while using this medicine? Your condition will be monitored carefully  while you are receiving this medicine. You may need blood work done while you are receiving this medicine. What side effects may I notice from receiving this medicine? Side effects that you should report to your doctor or health care professional as soon as possible: -allergic reactions like skin rash, itching or hives, swelling of the face, lips, or tongue -facial flushing -muscle weakness -signs and symptoms of low blood pressure like dizziness; feeling faint or lightheaded, falls; unusually weak or tired -signs and symptoms of a dangerous change in heartbeat or heart rhythm like chest pain; dizziness; fast or irregular heartbeat; palpitations; breathing problems -sweating This list may not describe all possible side effects. Call your doctor for medical advice about side effects. You may report side effects to FDA at 1-800-FDA-1088. Where should I keep my medicine? This drug is given in a hospital or clinic and will not be stored at home. NOTE: This sheet is a summary. It may not cover all possible information. If you have questions about this medicine, talk to your doctor, pharmacist, or health care provider.  2018 Elsevier/Gold Standard (2016-01-03 12:31:42)  

## 2017-06-30 ENCOUNTER — Inpatient Hospital Stay: Payer: BLUE CROSS/BLUE SHIELD

## 2017-06-30 ENCOUNTER — Other Ambulatory Visit: Payer: Self-pay | Admitting: Internal Medicine

## 2017-06-30 VITALS — BP 163/85 | HR 69 | Temp 96.4°F | Resp 18

## 2017-06-30 DIAGNOSIS — Z23 Encounter for immunization: Secondary | ICD-10-CM

## 2017-06-30 DIAGNOSIS — C9001 Multiple myeloma in remission: Secondary | ICD-10-CM

## 2017-06-30 DIAGNOSIS — C9000 Multiple myeloma not having achieved remission: Secondary | ICD-10-CM | POA: Diagnosis not present

## 2017-06-30 LAB — MAGNESIUM: MAGNESIUM: 1.2 mg/dL — AB (ref 1.7–2.4)

## 2017-06-30 MED ORDER — IRON SUCROSE 20 MG/ML IV SOLN
INTRAVENOUS | Status: AC
  Filled 2017-06-30: qty 10

## 2017-06-30 MED ORDER — IRON SUCROSE 20 MG/ML IV SOLN
200.0000 mg | Freq: Once | INTRAVENOUS | Status: AC
  Administered 2017-06-30: 200 mg via INTRAVENOUS
  Filled 2017-06-30: qty 10

## 2017-06-30 MED ORDER — INFLUENZA VAC SPLIT QUAD 0.5 ML IM SUSY
0.5000 mL | PREFILLED_SYRINGE | Freq: Once | INTRAMUSCULAR | Status: AC
Start: 2017-06-30 — End: 2017-06-30
  Administered 2017-06-30: 0.5 mL via INTRAMUSCULAR

## 2017-06-30 MED ORDER — SODIUM CHLORIDE 0.9 % IV SOLN
Freq: Once | INTRAVENOUS | Status: AC
  Administered 2017-06-30: 11:00:00 via INTRAVENOUS
  Filled 2017-06-30: qty 1000

## 2017-06-30 MED ORDER — MAGNESIUM SULFATE 4 GM/100ML IV SOLN
4.0000 g | Freq: Once | INTRAVENOUS | Status: AC
  Administered 2017-06-30: 4 g via INTRAVENOUS
  Filled 2017-06-30: qty 100

## 2017-06-30 NOTE — Progress Notes (Signed)
Patient reports that she went to [place] for Christmas and had two falls at her mother's house.  She says that she has tripped in that area before.  She also suffered bruises from her sister's dog jumping on her.  She is requesting a flu shot today and one has been ordered.

## 2017-07-03 ENCOUNTER — Inpatient Hospital Stay: Payer: BLUE CROSS/BLUE SHIELD | Attending: Internal Medicine

## 2017-07-03 ENCOUNTER — Other Ambulatory Visit: Payer: Self-pay | Admitting: *Deleted

## 2017-07-03 DIAGNOSIS — C9001 Multiple myeloma in remission: Secondary | ICD-10-CM | POA: Diagnosis present

## 2017-07-03 DIAGNOSIS — Z23 Encounter for immunization: Secondary | ICD-10-CM | POA: Insufficient documentation

## 2017-07-03 LAB — COMPREHENSIVE METABOLIC PANEL
ALBUMIN: 4.4 g/dL (ref 3.5–5.0)
ALT: 35 U/L (ref 14–54)
AST: 27 U/L (ref 15–41)
Alkaline Phosphatase: 110 U/L (ref 38–126)
Anion gap: 8 (ref 5–15)
BILIRUBIN TOTAL: 0.4 mg/dL (ref 0.3–1.2)
BUN: 29 mg/dL — AB (ref 6–20)
CHLORIDE: 103 mmol/L (ref 101–111)
CO2: 26 mmol/L (ref 22–32)
Calcium: 9.3 mg/dL (ref 8.9–10.3)
Creatinine, Ser: 1.18 mg/dL — ABNORMAL HIGH (ref 0.44–1.00)
GFR calc Af Amer: 57 mL/min — ABNORMAL LOW (ref >60)
GFR calc non Af Amer: 49 mL/min — ABNORMAL LOW (ref >60)
GLUCOSE: 119 mg/dL — AB (ref 65–99)
POTASSIUM: 4.4 mmol/L (ref 3.5–5.1)
SODIUM: 137 mmol/L (ref 135–145)
Total Protein: 7.6 g/dL (ref 6.5–8.1)

## 2017-07-03 LAB — CBC WITH DIFFERENTIAL/PLATELET
Basophils Absolute: 0 10*3/uL (ref 0–0.1)
Basophils Relative: 0 %
EOS PCT: 2 %
Eosinophils Absolute: 0.1 10*3/uL (ref 0–0.7)
HEMATOCRIT: 36.8 % (ref 35.0–47.0)
Hemoglobin: 12.5 g/dL (ref 12.0–16.0)
LYMPHS ABS: 1 10*3/uL (ref 1.0–3.6)
LYMPHS PCT: 25 %
MCH: 30.3 pg (ref 26.0–34.0)
MCHC: 34.1 g/dL (ref 32.0–36.0)
MCV: 88.8 fL (ref 80.0–100.0)
MONO ABS: 0.4 10*3/uL (ref 0.2–0.9)
MONOS PCT: 10 %
Neutro Abs: 2.7 10*3/uL (ref 1.4–6.5)
Neutrophils Relative %: 63 %
PLATELETS: 106 10*3/uL — AB (ref 150–440)
RBC: 4.15 MIL/uL (ref 3.80–5.20)
RDW: 14.5 % (ref 11.5–14.5)
WBC: 4.2 10*3/uL (ref 3.6–11.0)

## 2017-07-03 LAB — MAGNESIUM: Magnesium: 1.6 mg/dL — ABNORMAL LOW (ref 1.7–2.4)

## 2017-07-04 LAB — KAPPA/LAMBDA LIGHT CHAINS
Kappa free light chain: 12.4 mg/L (ref 3.3–19.4)
Kappa, lambda light chain ratio: 0.56 (ref 0.26–1.65)
Lambda free light chains: 22.2 mg/L (ref 5.7–26.3)

## 2017-07-07 LAB — MULTIPLE MYELOMA PANEL, SERUM
ALBUMIN/GLOB SERPL: 1.4 (ref 0.7–1.7)
Albumin SerPl Elph-Mcnc: 4 g/dL (ref 2.9–4.4)
Alpha 1: 0.3 g/dL (ref 0.0–0.4)
Alpha2 Glob SerPl Elph-Mcnc: 1 g/dL (ref 0.4–1.0)
B-Globulin SerPl Elph-Mcnc: 1.1 g/dL (ref 0.7–1.3)
Gamma Glob SerPl Elph-Mcnc: 0.6 g/dL (ref 0.4–1.8)
Globulin, Total: 2.9 g/dL (ref 2.2–3.9)
IGA: 131 mg/dL (ref 87–352)
IGG (IMMUNOGLOBIN G), SERUM: 504 mg/dL — AB (ref 700–1600)
IGM (IMMUNOGLOBULIN M), SRM: 37 mg/dL (ref 26–217)
TOTAL PROTEIN ELP: 6.9 g/dL (ref 6.0–8.5)

## 2017-07-08 ENCOUNTER — Inpatient Hospital Stay: Payer: BLUE CROSS/BLUE SHIELD

## 2017-07-08 ENCOUNTER — Inpatient Hospital Stay (HOSPITAL_BASED_OUTPATIENT_CLINIC_OR_DEPARTMENT_OTHER): Payer: BLUE CROSS/BLUE SHIELD | Admitting: Internal Medicine

## 2017-07-08 ENCOUNTER — Other Ambulatory Visit: Payer: Self-pay | Admitting: Internal Medicine

## 2017-07-08 VITALS — BP 127/76 | HR 93 | Temp 97.6°F | Resp 20 | Wt 156.5 lb

## 2017-07-08 VITALS — BP 143/83 | HR 91 | Resp 20

## 2017-07-08 DIAGNOSIS — C9001 Multiple myeloma in remission: Secondary | ICD-10-CM

## 2017-07-08 DIAGNOSIS — D84822 Immunodeficiency due to external causes: Secondary | ICD-10-CM

## 2017-07-08 DIAGNOSIS — M549 Dorsalgia, unspecified: Secondary | ICD-10-CM | POA: Diagnosis not present

## 2017-07-08 DIAGNOSIS — Z9484 Stem cells transplant status: Secondary | ICD-10-CM

## 2017-07-08 MED ORDER — SODIUM CHLORIDE 0.9 % IV SOLN
Freq: Once | INTRAVENOUS | Status: AC
  Administered 2017-07-08: 10:00:00 via INTRAVENOUS
  Filled 2017-07-08: qty 1000

## 2017-07-08 MED ORDER — POLIOVIRUS VACCINE INACTIVATED IJ INJ
0.5000 mL | INJECTION | Freq: Once | INTRAMUSCULAR | Status: AC
  Administered 2017-07-08: 0.5 mL via SUBCUTANEOUS
  Filled 2017-07-08: qty 5

## 2017-07-08 MED ORDER — DIPHTH-ACELL PERTUSSIS-TETANUS 15-23-5 LF-MCG/0.5 IM SUSP
1.0000 | Freq: Once | INTRAMUSCULAR | Status: AC
  Administered 2017-07-08: 11:00:00 via INTRAMUSCULAR
  Filled 2017-07-08: qty 0.5

## 2017-07-08 MED ORDER — PNEUMOCOCCAL VAC POLYVALENT 25 MCG/0.5ML IJ INJ
0.5000 mL | INJECTION | Freq: Once | INTRAMUSCULAR | Status: DC
  Administered 2017-07-08: 0.5 mL via INTRAMUSCULAR
  Filled 2017-07-08: qty 0.5

## 2017-07-08 MED ORDER — DTAP-HEPATITIS B RECOMB-IPV IM SUSP: 0.5000 mL | Freq: Once | INTRAMUSCULAR | 0 refills | Status: AC

## 2017-07-08 MED ORDER — HAEMOPHILUS B POLYSAC CONJ VAC IM SOLR
0.5000 mL | Freq: Once | INTRAMUSCULAR | Status: DC
  Administered 2017-07-08: 0.5 mL via INTRAMUSCULAR
  Filled 2017-07-08: qty 0.5

## 2017-07-08 MED ORDER — HEPATITIS B VAC RECOMBINANT 20 MCG/ML IJ SUSP
1.0000 mL | Freq: Once | INTRAMUSCULAR | Status: AC
  Administered 2017-07-08: 20 ug via INTRAMUSCULAR
  Filled 2017-07-08: qty 1

## 2017-07-08 MED ORDER — MEASLES, MUMPS & RUBELLA VAC ~~LOC~~ INJ
0.5000 mL | INJECTION | Freq: Once | SUBCUTANEOUS | Status: AC
  Administered 2017-07-08: 0.5 mL via SUBCUTANEOUS
  Filled 2017-07-08: qty 0.5

## 2017-07-08 MED ORDER — MAGNESIUM SULFATE 2 GM/50ML IV SOLN
2.0000 g | Freq: Once | INTRAVENOUS | Status: DC
  Administered 2017-07-08: 2 g via INTRAVENOUS

## 2017-07-08 NOTE — Addendum Note (Signed)
Addended by: Zara Chess on: 07/08/2017 10:06 AM   Modules accepted: Orders

## 2017-07-08 NOTE — Patient Instructions (Signed)
Magnesium Sulfate injection What is this medicine? MAGNESIUM SULFATE (mag NEE zee um SUL fate) is an electrolyte injection commonly used to treat low magnesium levels in your blood. It is also used to prevent or control seizures in women with preeclampsia or eclampsia. This medicine may be used for other purposes; ask your health care provider or pharmacist if you have questions. What should I tell my health care provider before I take this medicine? They need to know if you have any of these conditions: -heart disease -history of irregular heart beat -kidney disease -an unusual or allergic reaction to magnesium sulfate, medicines, foods, dyes, or preservatives -pregnant or trying to get pregnant -breast-feeding How should I use this medicine? This medicine is for infusion into a vein. It is given by a health care professional in a hospital or clinic setting. Talk to your pediatrician regarding the use of this medicine in children. While this drug may be prescribed for selected conditions, precautions do apply. Overdosage: If you think you have taken too much of this medicine contact a poison control center or emergency room at once. NOTE: This medicine is only for you. Do not share this medicine with others. What if I miss a dose? This does not apply. What may interact with this medicine? This medicine may interact with the following medications: -certain medicines for anxiety or sleep -certain medicines for seizures like phenobarbital -digoxin -medicines that relax muscles for surgery -narcotic medicines for pain This list may not describe all possible interactions. Give your health care provider a list of all the medicines, herbs, non-prescription drugs, or dietary supplements you use. Also tell them if you smoke, drink alcohol, or use illegal drugs. Some items may interact with your medicine. What should I watch for while using this medicine? Your condition will be monitored carefully  while you are receiving this medicine. You may need blood work done while you are receiving this medicine. What side effects may I notice from receiving this medicine? Side effects that you should report to your doctor or health care professional as soon as possible: -allergic reactions like skin rash, itching or hives, swelling of the face, lips, or tongue -facial flushing -muscle weakness -signs and symptoms of low blood pressure like dizziness; feeling faint or lightheaded, falls; unusually weak or tired -signs and symptoms of a dangerous change in heartbeat or heart rhythm like chest pain; dizziness; fast or irregular heartbeat; palpitations; breathing problems -sweating This list may not describe all possible side effects. Call your doctor for medical advice about side effects. You may report side effects to FDA at 1-800-FDA-1088. Where should I keep my medicine? This drug is given in a hospital or clinic and will not be stored at home. NOTE: This sheet is a summary. It may not cover all possible information. If you have questions about this medicine, talk to your doctor, pharmacist, or health care provider.  2018 Elsevier/Gold Standard (2016-01-03 12:31:42) Pneumococcal Vaccine, Polyvalent solution for injection What is this medicine? PNEUMOCOCCAL VACCINE, POLYVALENT (NEU mo KOK al vak SEEN, pol ee VEY luhnt) is a vaccine to prevent pneumococcus bacteria infection. These bacteria are a major cause of ear infections, Strep throat infections, and serious pneumonia, meningitis, or blood infections worldwide. These vaccines help the body to produce antibodies (protective substances) that help your body defend against these bacteria. This vaccine is recommended for people 61 years of age and older with health problems. It is also recommended for all adults over 18 years old. This vaccine  will not treat an infection. This medicine may be used for other purposes; ask your health care provider or  pharmacist if you have questions. COMMON BRAND NAME(S): Pneumovax 23 What should I tell my health care provider before I take this medicine? They need to know if you have any of these conditions: -bleeding problems -bone marrow or organ transplant -cancer, Hodgkin's disease -fever -infection -immune system problems -low platelet count in the blood -seizures -an unusual or allergic reaction to pneumococcal vaccine, diphtheria toxoid, other vaccines, latex, other medicines, foods, dyes, or preservatives -pregnant or trying to get pregnant -breast-feeding How should I use this medicine? This vaccine is for injection into a muscle or under the skin. It is given by a health care professional. A copy of Vaccine Information Statements will be given before each vaccination. Read this sheet carefully each time. The sheet may change frequently. Talk to your pediatrician regarding the use of this medicine in children. While this drug may be prescribed for children as young as 61 years of age for selected conditions, precautions do apply. Overdosage: If you think you have taken too much of this medicine contact a poison control center or emergency room at once. NOTE: This medicine is only for you. Do not share this medicine with others. What if I miss a dose? It is important not to miss your dose. Call your doctor or health care professional if you are unable to keep an appointment. What may interact with this medicine? -medicines for cancer chemotherapy -medicines that suppress your immune function -medicines that treat or prevent blood clots like warfarin, enoxaparin, and dalteparin -steroid medicines like prednisone or cortisone This list may not describe all possible interactions. Give your health care provider a list of all the medicines, herbs, non-prescription drugs, or dietary supplements you use. Also tell them if you smoke, drink alcohol, or use illegal drugs. Some items may interact with  your medicine. What should I watch for while using this medicine? Mild fever and pain should go away in 3 days or less. Report any unusual symptoms to your doctor or health care professional. What side effects may I notice from receiving this medicine? Side effects that you should report to your doctor or health care professional as soon as possible: -allergic reactions like skin rash, itching or hives, swelling of the face, lips, or tongue -breathing problems -confused -fever over 102 degrees F -pain, tingling, numbness in the hands or feet -seizures -unusual bleeding or bruising -unusual muscle weakness Side effects that usually do not require medical attention (report to your doctor or health care professional if they continue or are bothersome): -aches and pains -diarrhea -fever of 102 degrees F or less -headache -irritable -loss of appetite -pain, tender at site where injected -trouble sleeping This list may not describe all possible side effects. Call your doctor for medical advice about side effects. You may report side effects to FDA at 1-800-FDA-1088. Where should I keep my medicine? This does not apply. This vaccine is given in a clinic, pharmacy, doctor's office, or other health care setting and will not be stored at home. NOTE: This sheet is a summary. It may not cover all possible information. If you have questions about this medicine, talk to your doctor, pharmacist, or health care provider.  2018 Elsevier/Gold Standard (2008-01-22 14:32:37) Hepatitis B Vaccine, Recombinant injection What is this medicine? HEPATITIS B VACCINE (hep uh TAHY tis B VAK seen) is a vaccine. It is used to prevent an infection with the  hepatitis B virus. This medicine may be used for other purposes; ask your health care provider or pharmacist if you have questions. COMMON BRAND NAME(S): Engerix-B, Recombivax HB What should I tell my health care provider before I take this medicine? They need  to know if you have any of these conditions: -fever, infection -heart disease -hepatitis B infection -immune system problems -kidney disease -an unusual or allergic reaction to vaccines, yeast, other medicines, foods, dyes, or preservatives -pregnant or trying to get pregnant -breast-feeding How should I use this medicine? This vaccine is for injection into a muscle. It is given by a health care professional. A copy of Vaccine Information Statements will be given before each vaccination. Read this sheet carefully each time. The sheet may change frequently. Talk to your pediatrician regarding the use of this medicine in children. While this drug may be prescribed for children as young as newborn for selected conditions, precautions do apply. Overdosage: If you think you have taken too much of this medicine contact a poison control center or emergency room at once. NOTE: This medicine is only for you. Do not share this medicine with others. What if I miss a dose? It is important not to miss your dose. Call your doctor or health care professional if you are unable to keep an appointment. What may interact with this medicine? -medicines that suppress your immune function like adalimumab, anakinra, infliximab -medicines to treat cancer -steroid medicines like prednisone or cortisone This list may not describe all possible interactions. Give your health care provider a list of all the medicines, herbs, non-prescription drugs, or dietary supplements you use. Also tell them if you smoke, drink alcohol, or use illegal drugs. Some items may interact with your medicine. What should I watch for while using this medicine? See your health care provider for all shots of this vaccine as directed. You must have 3 shots of this vaccine for protection from hepatitis B infection. Tell your doctor right away if you have any serious or unusual side effects after getting this vaccine. What side effects may I notice  from receiving this medicine? Side effects that you should report to your doctor or health care professional as soon as possible: -allergic reactions like skin rash, itching or hives, swelling of the face, lips, or tongue -breathing problems -confused, irritated -fast, irregular heartbeat -flu-like syndrome -numb, tingling pain -seizures -unusually weak or tired Side effects that usually do not require medical attention (report to your doctor or health care professional if they continue or are bothersome): -diarrhea -fever -headache -loss of appetite -muscle pain -nausea -pain, redness, swelling, or irritation at site where injected -tiredness This list may not describe all possible side effects. Call your doctor for medical advice about side effects. You may report side effects to FDA at 1-800-FDA-1088. Where should I keep my medicine? This drug is given in a hospital or clinic and will not be stored at home. NOTE: This sheet is a summary. It may not cover all possible information. If you have questions about this medicine, talk to your doctor, pharmacist, or health care provider.  2018 Elsevier/Gold Standard (2013-10-18 13:26:01) Hib Vaccine (Haemophilus Influenzae Type b): What You Need to Know 1. Why get vaccinated? Haemophilus influenzae type b (Hib) disease is a serious disease caused by bacteria. It usually affects children under 64 years old. It can also affect adults with certain medical conditions. Your child can get Hib disease by being around other children or adults who may have the  bacteria and not know it. The germs spread from person to person. If the germs stay in the child's nose and throat, the child probably will not get sick. But sometimes the germs spread into the lungs or the bloodstream, and then Hib can cause serious problems. This is called invasive Hib disease. Before Hib vaccine, Hib disease was the leading cause of bacterial meningitis among children under 81  years old in the [place]. Meningitis is an infection of the lining of the brain and spinal cord. It can lead to brain damage and deafness. Hib disease can also cause:  pneumonia  severe swelling in the throat, making it hard to breathe  infections of the blood, joints, bones, and covering of the heart  death  Before Hib vaccine, about 20,000 children in the [place] under 77 years old got Hib disease each year, and about 3%-6% of them died. Hib vaccine can prevent Hib disease. Since use of Hib vaccine began, the number of cases of invasive Hib disease has decreased by more than 99%. Many more children would get Hib disease if we stopped vaccinating. 2. Hib vaccine Several different brands of Hib vaccine are available. Your child will receive either 3 or 4 doses, depending on which vaccine is used. Doses of Hib vaccine are usually recommended at these ages:  First dose: 59 months of age  Second dose: 84 months of age  Third dose: 33 months of age (if needed, depending on brand of vaccine)  Final/booster dose: 58-57 months of age  Hib vaccine may be given at the same time as other vaccines. Hib vaccine may be given as part of a combination vaccine. Combination vaccines are made when two or more types of vaccine are combined together into a single shot, so that one vaccination can protect against more than one disease. Children over 60 years old and adults usually do not need Hib vaccine. But it may be recommended for older children or adults with asplenia or sickle cell disease, before surgery to remove the spleen, or following a bone marrow transplant. It may also be recommended for people 23 to 61 years old with HIV. Ask your doctor for details. Your doctor or the person giving you the vaccine can give you more information. 3. Some people should not get this vaccine Hib vaccine should not be given to infants younger than 49 weeks of age. A person who has ever had a life-threatening  allergic reaction after a previous dose of Hib vaccine, OR has a severe allergy to any part of this vaccine, should not get Hib vaccine. Tell the person giving the vaccine about any severe allergies. People who are mildly ill can get Hib vaccine. People who are moderately or severely ill should probably wait until they recover. Talk to your healthcare provider if the person getting the vaccine isn't feeling well on the day the shot is scheduled. 4. Risks of a vaccine reaction With any medicine, including vaccines, there is a chance of side effects. These are usually mild and go away on their own. Serious reactions are also possible but are rare. Most people who get Hib vaccine do not have any problems with it. Mild problems following Hib vaccine:  redness, warmth, or swelling where the shot was given  fever  These problems are uncommon. If they occur, they usually begin soon after the shot and last 2 or 3 days. Problems that could happen after any vaccine: Any medication can cause a severe allergic  reaction. Such reactions from a vaccine are very rare, estimated at fewer than 1 in a million doses, and would happen within a few minutes to a few hours after the vaccination. As with any medicine, there is a very remote chance of a vaccine causing a serious injury or death. Older children, adolescents, and adults might also experience these problems after any vaccine:  People sometimes faint after a medical procedure, including vaccination. Sitting or lying down for about 15 minutes can help prevent fainting, and injuries caused by a fall. Tell your doctor if you feel dizzy, or have vision changes or ringing in the ears.  Some people get severe pain in the shoulder and have difficulty moving the arm where a shot was given. This happens very rarely.  The safety of vaccines is always being monitored. For more information, visit: http://www.aguilar.org/ 5. What if there is a serious reaction? What  should I look for? Look for anything that concerns you, such as signs of a severe allergic reaction, very high fever, or unusual behavior. Signs of a severe allergic reaction can include hives, swelling of the face and throat, difficulty breathing, a fast heartbeat, dizziness, and weakness. These would usually start a few minutes to a few hours after the vaccination. What should I do?  If you think it is a severe allergic reaction or other emergency that can't wait, call 9-1-1 or get the person to the nearest hospital. Otherwise, call your doctor.  Afterward, the reaction should be reported to the Vaccine Adverse Event Reporting System (VAERS). Your doctor might file this report, or you can do it yourself through the VAERS web site at www.vaers.SamedayNews.es, or by calling (437) 567-1890. ? VAERS does not give medical advice. 6. The National Vaccine Injury Compensation Program The [employer] Vaccine Injury Compensation Program (VICP) is a federal program that was created to compensate people who may have been injured by certain vaccines. Persons who believe they may have been injured by a vaccine can learn about the program and about filing a claim by calling (574) 019-1095 or visiting the [place] website at GoldCloset.com.ee. There is a time limit to file a claim for compensation. 7. How can I learn more?  Ask your doctor. He or she can give you the vaccine package insert or suggest other sources of information.  Call your local or state health department.  Contact the Centers for Disease Control and Prevention (CDC): ? Call 413-725-3151 (1-800-CDC-INFO) or ? Visit CDC's website at http://hunter.com/ CDC Haemophilus Influenzae Type b (Hib) Vaccine VIS (09/30/13) This information is not intended to replace advice given to you by your health care provider. Make sure you discuss any questions you have with your health care provider. Document Released: 04/14/2006 Document Revised:  03/07/2016 Document Reviewed: 03/07/2016 Elsevier Interactive Patient Education  2017 [employer]. Poliovirus Vaccine, Inactivated, IPV What is this medicine? INACTIVATED POLIOVIRUS VACCINE, IPV (in [place] tuh vey ted POH lee oh vahy ruhs vak SEEN) is used to prevent infections of polio. This medicine may be used for other purposes; ask your health care provider or pharmacist if you have questions. COMMON BRAND NAME(S): IPOL What should I tell my health care provider before I take this medicine? They need to know if you have any of these conditions: -immune system problems -infection with fever -an unusual or allergic reaction to poliovirus vaccine, 2-phenoxyethanol, formaldehyde, neomycin, streptomycin and polymyxin B, other medicines, foods, dyes, or preservatives -pregnant or trying to get pregnant -breast-feeding How should I use this medicine? This  vaccine is for injection into a muscle or under the skin. It is given by a health care professional. A copy of Vaccine Information Statements will be given before each vaccination. Read this sheet carefully each time. The sheet may change frequently. Talk to your pediatrician regarding the use of this medicine in children. While this drug may be prescribed for children as young as 53 weeks of age for selected conditions, precautions do apply. Overdosage: If you think you have taken too much of this medicine contact a poison control center or emergency room at once. NOTE: This medicine is only for you. Do not share this medicine with others. What if I miss a dose? Keep appointments for follow-up (booster) doses as directed. It is important not to miss your dose. Call your doctor or health care professional if you are unable to keep an appointment. What may interact with this medicine? -adalimumab -anakinra -infliximab -medicines that suppress your immune system -medicines to treat cancer -steroid medicines like prednisone or cortisone This  list may not describe all possible interactions. Give your health care provider a list of all the medicines, herbs, non-prescription drugs, or dietary supplements you use. Also tell them if you smoke, drink alcohol, or use illegal drugs. Some items may interact with your medicine. What should I watch for while using this medicine? Contact your doctor or health care professional and seek emergency medical care if any serious side effects occur. This vaccine, like all vaccines, may not fully protect everyone. What side effects may I notice from receiving this medicine? Side effects that you should report to your doctor or health care professional as soon as possible: -allergic reactions like skin rash, itching or hives, swelling of the face, lips, or tongue -breathing problems -extreme changes in behavior -fever over 101 degrees F -inconsolable crying for 3 hours or more -seizures -unusually weak or tired Side effects that usually do not require medical attention (report to your doctor or health care professional if they continue or are bothersome): -bruising, pain, swelling at site where injected -fussy -loss of appetite -low-grade fever -sleepy -vomiting This list may not describe all possible side effects. Call your doctor for medical advice about side effects. You may report side effects to FDA at 1-800-FDA-1088. Where should I keep my medicine? This drug is given in a hospital or clinic and will not be stored at home. NOTE: This sheet is a summary. It may not cover all possible information. If you have questions about this medicine, talk to your doctor, pharmacist, or health care provider.  2018 Elsevier/Gold Standard (2013-05-26 13:59:35) MMR (Measles, Mumps and Rubella) Vaccine: What You Need to Know 1. Why get vaccinated? Measles, mumps, and rubella are viral diseases that can have serious consequences. Before vaccines, these diseases were very common in the [place],  especially among children. They are still common in many parts of the world. Measles  Measles virus causes symptoms that can include fever, cough, runny nose, and red, watery eyes, commonly followed by a rash that covers the whole body.  Measles can lead to ear infections, diarrhea, and infection of the lungs (pneumonia). Rarely, measles can cause brain damage or death. Mumps  Mumps virus causes fever, headache, muscle aches, tiredness, loss of appetite, and swollen and tender salivary glands under the ears on one or both sides.  Mumps can lead to deafness, swelling of the brain and/or spinal cord covering (encephalitis or meningitis), painful swelling of the testicles or ovaries, and, very rarely, death.  Rubella (also known as [place] Measles)  Rubella virus causes fever, sore throat, rash, headache, and eye irritation.  Rubella can cause arthritis in up to half of teenage and adult women.  If a woman gets rubella while she is pregnant, she could have a miscarriage or her baby could be born with serious birth defects. These diseases can easily spread from person to person. Measles doesn't even require personal contact. You can get measles by entering a room that a person with measles left up to 2 hours before. Vaccines and high rates of vaccination have made these diseases much less common in the [place]. 2. MMR vaccine Children should get 2 doses of MMR vaccine, usually:  First dose: 12 through 71 months of age  Second dose: 25 through 61 years of age  72 who will be traveling outside the [place] when they are between 80 and 33 months of age should get a dose of MMR vaccine before travel. This can provide temporary protection from measles infection, but will not give permanent immunity. The child should still get 2 doses at the recommended ages for long-lasting protection. Adults might also need MMR vaccine. Many adults 73 years of age and older might be susceptible to  measles, mumps, and rubella without knowing it. A third dose of MMR might be recommended in certain mumps outbreak situations. There are no known risks to getting MMR vaccine at the same time as other vaccines. There is a combination vaccine called MMRV that contains both chickenpox and MMR vaccines. MMRV is an option for some children 12 months through 88 years of age. There is a separate Vaccine Information Statement for MMRV. Your health care provider can give you more information. 3. Some people should not get this vaccine Tell your vaccine provider if the person getting the vaccine:  Has any severe, life-threatening allergies. A person who has ever had a life-threatening allergic reaction after a dose of MMR vaccine, or has a severe allergy to any part of this vaccine, may be advised not to be vaccinated. Ask your health care provider if you want information about vaccine components.  Is pregnant, or thinks she might be pregnant. Pregnant women should wait to get MMR vaccine until after they are no longer pregnant. Women should avoid getting pregnant for at least 1 month after getting MMR vaccine.  Has a weakened immune system due to disease (such as cancer or HIV/AIDS) or medical treatments (such as radiation, immunotherapy, steroids, or chemotherapy).  Has a parent, brother, or sister with a history of immune system problems.  Has ever had a condition that makes them bruise or bleed easily.  Has recently had a blood transfusion or received other blood products. You might be advised to postpone MMR vaccination for 3 months or more.  Has tuberculosis.  Has gotten any other vaccines in the past 4 weeks. Live vaccines given too close together might not work as well.  Is not feeling well. A mild illness, such as a cold, is usually not a reason to postpone a vaccination. Someone who is moderately or severely ill should probably wait. Your doctor can advise you.  4. Risks of a vaccine  reaction With any medicine, including vaccines, there is a chance of reactions. These are usually mild and go away on their own, but serious reactions are also possible. Getting MMR vaccine is much safer than getting measles, mumps, or rubella disease. Most people who get MMR vaccine do not have any problems  with it. After MMR vaccination, a person might experience: Minor events:  Sore arm from the injection  Fever  Redness or rash at the injection site  Swelling of glands in the cheeks or neck If these events happen, they usually begin within 2 weeks after the shot. They occur less often after the second dose. Moderate events:  Seizure (jerking or staring) often associated with fever  Temporary pain and stiffness in the joints, mostly in teenage or adult women  Temporary low platelet count, which can cause unusual bleeding or bruising  Rash all over body Severe events occur very rarely:  Deafness  Long-term seizures, coma, or lowered consciousness  Brain damage Other things that could happen after this vaccine:  People sometimes faint after medical procedures, including vaccination. Sitting or lying down for about 15 minutes can help prevent fainting and injuries caused by a fall. Tell your provider if you feel dizzy or have vision changes or ringing in the ears.  Some people get shoulder pain that can be more severe and longer-lasting than routine soreness that can follow injections. This happens very rarely.  Any medication can cause a severe allergic reaction. Such reactions to a vaccine are estimated at about 1 in a million doses, and would happen within a few minutes to a few hours after the vaccination. As with any medicine, there is a very remote chance of a vaccine causing a serious injury or death. The safety of vaccines is always being monitored. For more information, visit: http://www.aguilar.org/ 5. What if there is a serious problem? What should I look  for?  Look for anything that concerns you, such as signs of a severe allergic reaction, very high fever, or unusual behavior. Signs of a severe allergic reaction can include hives, swelling of the face and throat, difficulty breathing, a fast heartbeat, dizziness, and weakness. These would usually start a few minutes to a few hours after the vaccination. What should I do?  If you think it is a severe allergic reaction or other emergency that can't wait, call 9-1-1 and get to the nearest hospital. Otherwise, call your health care provider.  Afterward, the reaction should be reported to the Vaccine Adverse Event Reporting System (VAERS). Your doctor should file this report, or you can do it yourself through the VAERS web site at www.vaers.SamedayNews.es, or by calling (859) 174-2307. ? VAERS does not give medical advice. 6. The National Vaccine Injury Compensation Program The [employer] Vaccine Injury Compensation Program (VICP) is a federal program that was created to compensate people who may have been injured by certain vaccines. Persons who believe they may have been injured by a vaccine can learn about the program and about filing a claim by calling 323-097-7984 or visiting the [place] website at GoldCloset.com.ee. There is a time limit to file a claim for compensation. 7. How can I learn more?  Ask your healthcare provider. He or she can give you the vaccine package insert or suggest other sources of information.  Call your local or state health department.  Contact the Centers for Disease Control and Prevention (CDC): ? Call 6291131097 (1-800-CDC-INFO)  or ? Visit CDC's website at http://hunter.com/ CDC Vaccine Information Statement (VIS) MMR Vaccine (08/12/2016) This information is not intended to replace advice given to you by your health care provider. Make sure you discuss any questions you have with your health care provider. Document Released: 04/14/2006 Document  Revised: 08/27/2016 Document Reviewed: 08/27/2016 Elsevier Interactive Patient Education  2018 [employer]. DTaP Vaccine (Diphtheria,  Tetanus, and Pertussis): What You Need to Know 1. Why get vaccinated? Diphtheria, tetanus, and pertussis are serious diseases caused by bacteria. Diphtheria and pertussis are spread from person to person. Tetanus enters the body through cuts or wounds. DIPHTHERIA causes a thick covering in the back of the throat.  It can lead to breathing problems, paralysis, heart failure, and even death.  TETANUS (Lockjaw) causes painful tightening of the muscles, usually all over the body.  It can lead to "locking" of the jaw so the victim cannot open his mouth or swallow. Tetanus leads to death in up to 2 out of 10 cases.  PERTUSSIS (Whooping Cough) causes coughing spells so bad that it is hard for infants to eat, drink, or breathe. These spells can last for weeks.  It can lead to pneumonia, seizures (jerking and staring spells), brain damage, and death.  Diphtheria, tetanus, and pertussis vaccine (DTaP) can help prevent these diseases. Most children who are vaccinated with DTaP will be protected throughout childhood. Many more children would get these diseases if we stopped vaccinating. DTaP is a safer version of an older vaccine called DTP. DTP is no longer used in the [place]. 2. Who should get DTaP vaccine and when? Children should get 5 doses of DTaP vaccine, one dose at each of the following ages:  2 months  4 months  6 months  15-18 months  4-6 years  DTaP may be given at the same time as other vaccines. 3. Some children should not get DTaP vaccine or should wait  Children with minor illnesses, such as a cold, may be vaccinated. But children who are moderately or severely ill should usually wait until they recover before getting DTaP vaccine.  Any child who had a life-threatening allergic reaction after a dose of DTaP should not get another  dose.  Any child who suffered a brain or nervous system disease within 7 days after a dose of DTaP should not get another dose.  Talk with your doctor if your child: ? had a seizure or collapsed after a dose of DTaP, ? cried non-stop for 3 hours or more after a dose of DTaP, ? had a fever over 105F after a dose of DTaP. Ask your doctor for more information. Some of these children should not get another dose of pertussis vaccine, but may get a vaccine without pertussis, called DT. 4. Older children and adults DTaP is not licensed for adolescents, adults, or children 41 years of age and older. But older people still need protection. A vaccine called Tdap is similar to DTaP. A single dose of Tdap is recommended for people 11 through 61 years of age. Another vaccine, called Td, protects against tetanus and diphtheria, but not pertussis. It is recommended every 10 years. There are separate Vaccine Information Statements for these vaccines. 5. What are the risks from DTaP vaccine? Getting diphtheria, tetanus, or pertussis disease is much riskier than getting DTaP vaccine. However, a vaccine, like any medicine, is capable of causing serious problems, such as severe allergic reactions. The risk of DTaP vaccine causing serious harm, or death, is extremely small. Mild problems (common)  Fever (up to about 1 child in 4)  Redness or swelling where the shot was given (up to about 1 child in 4)  Soreness or tenderness where the shot was given (up to about 1 child in 4) These problems occur more often after the 4th and 5th doses of the DTaP series than after earlier doses. Sometimes  the 4th or 5th dose of DTaP vaccine is followed by swelling of the entire arm or leg in which the shot was given, lasting 1-7 days (up to about 1 child in 71). Other mild problems include:  Fussiness (up to about 1 child in 3)  Tiredness or poor appetite (up to about 1 child in 10)  Vomiting (up to about 1 child in  48) These problems generally occur 1-3 days after the shot. Moderate problems (uncommon)  Seizure (jerking or staring) (about 1 child out of 14,000)  Non-stop crying, for 3 hours or more (up to about 1 child out of 1,000)  High fever, over 105F (about 1 child out of 16,000) Severe problems (very rare)  Serious allergic reaction (less than 1 out of a million doses)  Several other severe problems have been reported after DTaP vaccine. These include: ? Long-term seizures, coma, or lowered consciousness ? Permanent brain damage. These are so rare it is hard to tell if they are caused by the vaccine. Controlling fever is especially important for children who have had seizures, for any reason. It is also important if another family member has had seizures. You can reduce fever and pain by giving your child an aspirin-free pain reliever when the shot is given, and for the next 24 hours, following the package instructions. 6. What if there is a serious reaction? What should I look for? Look for anything that concerns you, such as signs of a severe allergic reaction, very high fever, or behavior changes. Signs of a severe allergic reaction can include hives, swelling of the face and throat, difficulty breathing, a fast heartbeat, dizziness, and weakness. These would start a few minutes to a few hours after the vaccination. What should I do?  If you think it is a severe allergic reaction or other emergency that can't wait, call 9-1-1 or get the person to the nearest hospital. Otherwise, call your doctor.  Afterward, the reaction should be reported to the Vaccine Adverse Event Reporting System (VAERS). Your doctor might file this report, or you can do it yourself through the VAERS web site at www.vaers.SamedayNews.es, or by calling 240-212-6909. ? VAERS is only for reporting reactions. They do not give medical advice. 7. The National Vaccine Injury Compensation Program The [employer] Vaccine Injury  Compensation Program (VICP) is a federal program that was created to compensate people who may have been injured by certain vaccines. Persons who believe they may have been injured by a vaccine can learn about the program and about filing a claim by calling 954-084-3896 or visiting the [place] website at GoldCloset.com.ee. 8. How can I learn more?  Ask your doctor.  Call your local or state health department.  Contact the Centers for Disease Control and Prevention (CDC): ? Call (279)569-7788 (1-800-CDC-INFO) or ? Visit CDC's website at http://hunter.com/ CDC DTaP Vaccine (Diphtheria, Tetanus, and Pertussis) VIS (11/14/05) This information is not intended to replace advice given to you by your health care provider. Make sure you discuss any questions you have with your health care provider. Document Released: 04/14/2006 Document Revised: 03/07/2016 Document Reviewed: 03/07/2016 Elsevier Interactive Patient Education  2017 [employer].

## 2017-07-08 NOTE — Assessment & Plan Note (Addendum)
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  HELD maintenance revlimid since July 2018 sec to ONJ  # jan 2019- serum M protein absent/K-L light chains- normal. Clinically no evidence of progression at this time. Continue to HOLD Revlimid for now.  # ONJ- AUG 2018-[oral surgery at [facility]. I would recommend discontinuation of Zometa at this time. Patient takes calcium and vitamin d currently.   # Nausea chronic on Phenergan 25 mg prn.  Reviewed recent EGD colonoscopy.  # Chronic diarrhea alternating with constipation-  ? IBD;  Colestyramine prn per GI according to pt.    #  Hypomagnesemia- Mg 1.3 today. IV mag today and continue with weekly lab monitoring and replenishment as needed.    # Chronic back pain- pain management follow-up; on fentanyl  25 mc/ [Dr.James; [facility]; [place]]; no BTP. Skeletal survey nov 2018- Normal.   #Continue with post transplant immunizations-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  # follow up in 2 months/myeloma panel; k-l light chains; weekly mag/infusion.

## 2017-07-08 NOTE — Progress Notes (Signed)
[facility] NOTE  Patient Care Team: Glean Hess, MD as PCP - General (Internal Medicine) Cammie Sickle, MD as Consulting Physician (Oncology) Jodell Cipro, MD as Referring Physician (Pain Medicine) Corey Skains, MD as Consulting Physician (Cardiology)  CHIEF COMPLAINTS/PURPOSE OF CONSULTATION:  Multiple myeloma  # MULTIPLE MYELOMA-   Oncology History   # June 2017- [facility] MULTIPLE MYELOMA [BMB[facility]- 80% plasma cells; [facility]]; Lamda light chain 1340; s/p RVD   # 14th DEC 2016- Auto-Stem cell transplant [[facility];Dr.Hertzig]; rev [dose reduced sec to [place]  # Maintenance Revlimid- 64m 3 w-ON & 1 week OFF; HELD July 2018- sec to ONJ  # Bone lesions-numerous ~525mlytic lesions in Thoracic spine- on Zometa  # July-Aug 2018- OSTEONECROSIS OF JAW- DISCONTINUED ZOMETA  # June 2016- Proteinuria [3gm/day]/ CKD III   # chronic back pain/ anxiety/ PN   # "Colitis" s/p multiple EGD/Colonoscopies; EGD- patchy inflammation/colo- Nov 2018 [Dr.Anna]     [facility]    HISTORY OF PRESENTING ILLNESS:  Sarah Palomino3.o.  female of her history of multiple myeloma diagnosed June 2016 currently on Surveillance [ since July 2018 maintenance Revlimid on hold]  Is here for follow-up.  Patient continues to have mild to moderate fatigue.  She denies any ongoing diarrhea constipation.  Currently patient is more constipated than diarrhea.  Continues to have chronic cramps in the legs.  No swelling.  No new pain anywhere else.  Patient feels magnesium infusions are helping each week.   ROS: A complete 10 point review of system is done which is negative except mentioned above in history of present illness  MEDICAL HISTORY:  Past Medical History:  Diagnosis Date  . Anemia   . Anxiety   . Arthritis   . Bicuspid aortic valve   . [place]ive [place] failure) ([place]  . CKD  (chronic kidney disease) stage 3, GFR 30-59 ml/min (HCC)   . Depression   . Diabetes mellitus ([facility]  . Dizziness   . Fatty liver   . Frequent falls   . GERD (gastroesophageal reflux disease)   . Gout   . Heart murmur   . History of blood transfusion   . [facility] of bone marrow transplant ([facility]  . History of uterine fibroid   . Hypertension   . Hypomagnesemia   . Multiple myeloma ([facility]  . Personal history of chemotherapy   . Renal cyst     SURGICAL HISTORY: Past Surgical History:  Procedure Laterality Date  . ABDOMINAL HYSTERECTOMY    . 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 CARDIAC ELECTROPHYSIOLOGY MAPPING AND ABLATION    . CARPAL TUNNEL RELEASE Bilateral   . CHOLECYSTECTOMY  2008  . COLONOSCOPY WITH PROPOFOL N/A 05/07/2017   Procedure: COLONOSCOPY WITH PROPOFOL;  Surgeon: AnJonathon BellowsMD;  [facility];  Service: Gastroenterology;  Laterality: N/A;  . ESOPHAGOGASTRODUODENOSCOPY (EGD) WITH PROPOFOL N/A 05/07/2017   Procedure: ESOPHAGOGASTRODUODENOSCOPY (EGD) WITH PROPOFOL;  Surgeon: AnJonathon BellowsMD;  [facility];  Service: Gastroenterology;  Laterality: N/A;  . FOOT SURGERY Bilateral   . INCONTINENCE SURGERY  2009  . PARTIAL HYSTERECTOMY  03/1996   fibroids  . TONSILLECTOMY  2007   [place] quit [place]; occasional alcohol; lives [place] with step sister.  Social History   Socioeconomic History  . Marital status: Widowed    Spouse  name: Not on file  . Number of children: Not on file  . Years of education: Not on file  . Highest education level: Not on file  Social Needs  . Financial resource strain: Not on file  . Food insecurity - worry: Not on file  . Food insecurity - inability: Not on file  . Transportation needs - medical: Not on file  . Transportation needs - non-medical: Not on file  Occupational History  . Occupation: Disabled  Tobacco Use  . Smoking status: Former Smoker     Packs/day: 1.00    Years: 20.00    Pack years: 20.00    Types: Cigarettes    Last attempt to quit: 07/02/1991    Years since quitting: 26.0  . Smokeless tobacco: Never Used  Substance and Sexual Activity  . Alcohol use: Yes    Alcohol/week: 1.2 oz    Types: 1 Glasses of wine, 1 Cans of beer per week    Comment: occassional  . Drug use: No  . Sexual activity: No  Other Topics Concern  . Not on file  Social History Narrative  . Not on file    FAMILY HISTORY: Family History  Problem Relation Age of Onset  . Colon cancer Father   . Renal Disease Father   . Diabetes Mellitus II Father   . Melanoma Paternal Grandmother   . Breast cancer Maternal Aunt 110  . Anemia Mother   . Heart disease Mother   . Heart failure Mother   . Renal Disease Mother   . Congestive Heart Failure Mother   . Heart disease Maternal Uncle   . Throat cancer Maternal Uncle   . Lung cancer Maternal Uncle   . Liver disease Maternal Uncle   . Heart failure Maternal Uncle   . Hearing loss Son 18       Suicide     ALLERGIES:  is allergic to oxycodone-acetaminophen; benadryl [diphenhydramine]; morphine; ondansetron; and tylenol [acetaminophen].  MEDICATIONS:  Current Outpatient Medications  Medication Sig Dispense Refill  . acyclovir (ZOVIRAX) 200 MG capsule TAKE 2 CAPSULES(400 MG) BY MOUTH THREE TIMES DAILY 180 capsule 1  . allopurinol (ZYLOPRIM) 100 MG tablet TAKE 1 TABLET(100 MG) BY MOUTH DAILY 30 tablet 5  . Biotin 1 MG CAPS Take by mouth.    . bisoprolol (ZEBETA) 10 MG tablet TAKE 1 TABLET(10 MG) BY MOUTH DAILY 30 tablet 5  . cholestyramine (QUESTRAN) 4 g packet Take 4 g once daily. 60 each 3  . DULoxetine (CYMBALTA) 60 MG capsule TAKE 1 CAPSULE(60 MG) BY MOUTH TWICE DAILY 60 capsule 0  . fentaNYL (DURAGESIC - DOSED MCG/HR) 25 MCG/HR patch Place 25 mcg onto the skin every 3 (three) days.    . fexofenadine (ALLEGRA) 180 MG tablet TAKE 1 TABLET(180 MG) BY MOUTH DAILY 30 tablet 5  . fluticasone  (FLONASE) 50 MCG/ACT nasal spray Place 2 sprays into both nostrils daily. 16 g 0  . glucose blood (ONE TOUCH ULTRA TEST) test strip     . hydrOXYzine (ATARAX/VISTARIL) 10 MG tablet TAKE 1 TABLET(10 MG) BY MOUTH THREE TIMES DAILY AS NEEDED 90 tablet 0  . montelukast (SINGULAIR) 10 MG tablet Take 1 tablet (10 mg total) by mouth at bedtime. 30 tablet 5  . Multiple Vitamins-Minerals (HAIR/SKIN/NAILS/BIOTIN PO) Take 1 capsule by mouth 3 (three) times daily.    Marland Kitchen omeprazole (PRILOSEC) 40 MG capsule Take 1 capsule (40 mg total) by mouth 2 (two) times daily. 60 capsule 5  . promethazine (PHENERGAN)  25 MG tablet 1-2 pills prior to meals twice a day as needed 60 tablet 4  . tiZANidine (ZANAFLEX) 4 MG tablet TAKE 1 TABLET BY MOUTH EVERY 8 HOURS AS NEEDED, MUSCLE RELAXER FOR BACK PAIN 90 tablet 0  . zaleplon (SONATA) 10 MG capsule TAKE 1 CAPSULE BY MOUTH EVERY DAY AT BEDTIME AS NEEDED FOR SLEEP 30 capsule 5  . Calcium Carb-Cholecalciferol (CALCIUM CARBONATE-VITAMIN D3 PO) Take by mouth.    . Calcium Carbonate-Vit D-Min (CALCIUM 600+D PLUS MINERALS) 600-400 MG-UNIT TABS Take 1 tablet by mouth daily.     . diphenoxylate-atropine (LOMOTIL) 2.5-0.025 MG tablet Take 2 tablets by mouth daily as needed for diarrhea or loose stools. (Patient not taking: Reported on 07/08/2017) 30 tablet 2  . furosemide (LASIX) 20 MG tablet Take 0.5 tablets (10 mg total) by mouth daily as needed. (Patient not taking: Reported on 07/08/2017) 30 tablet 0  . HYDROcodone-ibuprofen (VICOPROFEN) 7.5-200 MG tablet Take by mouth.    . traZODone (DESYREL) 50 MG tablet Take by mouth.     No current facility-administered medications for this visit.     PHYSICAL EXAMINATION: ECOG PERFORMANCE STATUS: 1 - Symptomatic but completely ambulatory  Vitals:   07/08/17 0902  BP: 127/76  Pulse: 93  Resp: 20  Temp: 97.6 F (36.4 C)   Filed Weights   07/08/17 0902  Weight: 156 lb 8.4 oz (71 kg)    GENERAL: Well-nourished well-developed; Alert, no  distress and comfortable.  EYES: no pallor or icterus OROPHARYNX: no thrush;  NECK: supple, no masses felt LYMPH:  no palpable lymphadenopathy in the cervical, axillary or inguinal regions LUNGS: clear to auscultation and  No wheeze or crackles HEART/CVS: regular rate & rhythm and no murmurs; No lower extremity edema ABDOMEN: abdomen soft, non-tender and normal bowel sounds Musculoskeletal:no cyanosis of digits and no clubbing  PSYCH: alert & oriented x 3 with fluent speech NEURO: no focal motor/sensory deficits SKIN:  no rashes or significant lesions  LABORATORY DATA:  I have reviewed the data as listed Lab Results  Component Value Date   WBC 4.2 07/03/2017   HGB 12.5 07/03/2017   HCT 36.8 07/03/2017   MCV 88.8 07/03/2017   PLT 106 (L) 07/03/2017   Recent Labs    05/06/17 0842 05/13/17 0846 07/03/17 0810  NA 139 135 137  K 4.4 4.2 4.4  CL 103 102 103  CO2 _0 GLUCOSE 137* 122* 119*  BUN 18 31* 29*  CREATININE 1.13* 1.17* 1.18*  CALCIUM 9.2 9.2 9.3  GFRNONAA 52* 50* 49*  GFRAA 60* 57* 57*  PROT 7.9 7.2 7.6  ALBUMIN 4.6 4.4 4.4  AST 33 30 27  ALT 25 23 35  ALKPHOS 101 91 110  BILITOT 0.4 0.4 0.4    Results for MICALA, SALTSMAN (MRN [id number]) as of 05/13/2017 09:40  Ref. Range 06/18/2016 11:15 09/24/2016 10:06 12/24/2016 09:37 03/18/2017 08:20 05/06/2017 08:42  Kappa free light chain Latest Ref Range: 3.3 - 19.4 mg/L 17.1 12.5 15.1 16.6 15.1  Lamda free light chains Latest Ref Range: 5.7 - 26.3 mg/L 15.8 14.6 19.3 20.0 21.0  Kappa, lamda light chain ratio Latest Ref Range: 0.26 - 1.65  1.08 0.86 0.78 0.83 0.72  M Protein SerPl Elph-Mcnc Latest Ref Range: Not Observed g/dL _1       RADIOGRAPHIC STUDIES: I have personally reviewed the radiological images as listed and agreed with the findings in the report. No results found.  ASSESSMENT & PLAN:   Multiple myeloma in remission ([place]) Multiple  myeloma IgA lambda status post autologous stem cell transplant in December 2016 [[facility]].  HELD maintenance revlimid since July 2018 sec to ONJ  # jan 2019- serum M protein absent/K-L light chains- normal. Clinically no evidence of progression at this time. Continue to HOLD Revlimid for now.  # ONJ- AUG 2018-[oral surgery at [facility]. I would recommend discontinuation of Zometa at this time. Patient takes calcium and vitamin d currently.   # Nausea chronic on Phenergan 25 mg prn.  Reviewed recent EGD colonoscopy.  # Chronic diarrhea alternating with constipation-  ? IBD;  Colestyramine prn per GI according to pt.    #  Hypomagnesemia- Mg 1.3 today. IV mag today and continue with weekly lab monitoring and replenishment as needed.    # Chronic back pain- pain management follow-up; on fentanyl  25 mc/ [Dr.James; [facility]; [place]]; no BTP. Skeletal survey nov 2018- Normal.   #Continue with post transplant immunizations-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  # follow up in 2 months/myeloma panel; k-l light chains; weekly mag/infusion.    Cammie Sickle, MD 07/08/2017 9:37 AM

## 2017-07-09 ENCOUNTER — Other Ambulatory Visit: Payer: Self-pay | Admitting: Internal Medicine

## 2017-07-09 MED FILL — Diph, Acellular Pert & Tet Tox Inj 15 LF-23 MCG-5 LF/0.5ML: INTRAMUSCULAR | Qty: 0.5 | Status: AC

## 2017-07-10 ENCOUNTER — Other Ambulatory Visit: Payer: Self-pay | Admitting: Internal Medicine

## 2017-07-15 ENCOUNTER — Inpatient Hospital Stay: Payer: BLUE CROSS/BLUE SHIELD

## 2017-07-15 ENCOUNTER — Other Ambulatory Visit: Payer: Self-pay | Admitting: Internal Medicine

## 2017-07-15 VITALS — BP 131/86 | HR 76 | Temp 95.9°F | Resp 20

## 2017-07-15 DIAGNOSIS — C9001 Multiple myeloma in remission: Secondary | ICD-10-CM | POA: Diagnosis not present

## 2017-07-15 LAB — MAGNESIUM: Magnesium: 1.2 mg/dL — ABNORMAL LOW (ref 1.7–2.4)

## 2017-07-15 MED ORDER — SODIUM CHLORIDE 0.9 % IV SOLN
Freq: Once | INTRAVENOUS | Status: AC
  Administered 2017-07-15: 11:00:00 via INTRAVENOUS
  Filled 2017-07-15: qty 1000

## 2017-07-15 MED ORDER — MAGNESIUM SULFATE 4 GM/100ML IV SOLN
4.0000 g | Freq: Once | INTRAVENOUS | Status: AC
  Administered 2017-07-15: 4 g via INTRAVENOUS

## 2017-07-15 MED ORDER — MAGNESIUM SULFATE 4 GM/100ML IV SOLN
INTRAVENOUS | Status: AC
  Filled 2017-07-15: qty 100

## 2017-07-22 ENCOUNTER — Inpatient Hospital Stay: Payer: BLUE CROSS/BLUE SHIELD

## 2017-07-25 ENCOUNTER — Other Ambulatory Visit: Payer: Self-pay

## 2017-07-25 ENCOUNTER — Emergency Department: Payer: BLUE CROSS/BLUE SHIELD

## 2017-07-25 ENCOUNTER — Observation Stay: Payer: BLUE CROSS/BLUE SHIELD

## 2017-07-25 ENCOUNTER — Observation Stay
Admission: EM | Admit: 2017-07-25 | Discharge: 2017-07-26 | Disposition: A | Discharge status: Home Health | Payer: BLUE CROSS/BLUE SHIELD | Attending: Internal Medicine | Admitting: Internal Medicine

## 2017-07-25 ENCOUNTER — Encounter: Payer: Self-pay | Admitting: Emergency Medicine

## 2017-07-25 DIAGNOSIS — Z885 Allergy status to narcotic agent status: Secondary | ICD-10-CM | POA: Diagnosis not present

## 2017-07-25 DIAGNOSIS — R42 Dizziness and giddiness: Secondary | ICD-10-CM | POA: Diagnosis present

## 2017-07-25 DIAGNOSIS — R200 Anesthesia of skin: Secondary | ICD-10-CM

## 2017-07-25 DIAGNOSIS — E041 Nontoxic single thyroid nodule: Secondary | ICD-10-CM | POA: Diagnosis not present

## 2017-07-25 DIAGNOSIS — K219 Gastro-esophageal reflux disease without esophagitis: Secondary | ICD-10-CM | POA: Insufficient documentation

## 2017-07-25 DIAGNOSIS — Z794 Long term (current) use of insulin: Secondary | ICD-10-CM | POA: Insufficient documentation

## 2017-07-25 DIAGNOSIS — R202 Paresthesia of skin: Secondary | ICD-10-CM | POA: Diagnosis not present

## 2017-07-25 DIAGNOSIS — E1122 Type 2 diabetes mellitus with diabetic chronic kidney disease: Secondary | ICD-10-CM | POA: Insufficient documentation

## 2017-07-25 DIAGNOSIS — R262 Difficulty in walking, not elsewhere classified: Secondary | ICD-10-CM | POA: Diagnosis not present

## 2017-07-25 DIAGNOSIS — G459 Transient cerebral ischemic attack, unspecified: Secondary | ICD-10-CM | POA: Diagnosis not present

## 2017-07-25 DIAGNOSIS — I130 Hypertensive heart and chronic kidney disease with heart failure and stage 1 through stage 4 chronic kidney disease, or unspecified chronic kidney disease: Secondary | ICD-10-CM | POA: Diagnosis not present

## 2017-07-25 DIAGNOSIS — Z79899 Other long term (current) drug therapy: Secondary | ICD-10-CM | POA: Insufficient documentation

## 2017-07-25 DIAGNOSIS — C9001 Multiple myeloma in remission: Secondary | ICD-10-CM | POA: Insufficient documentation

## 2017-07-25 DIAGNOSIS — I5032 Chronic diastolic (congestive) heart failure: Secondary | ICD-10-CM | POA: Diagnosis not present

## 2017-07-25 DIAGNOSIS — Z7982 Long term (current) use of aspirin: Secondary | ICD-10-CM | POA: Insufficient documentation

## 2017-07-25 DIAGNOSIS — Z9481 Bone marrow transplant status: Secondary | ICD-10-CM | POA: Diagnosis not present

## 2017-07-25 DIAGNOSIS — N183 Chronic kidney disease, stage 3 (moderate): Secondary | ICD-10-CM | POA: Insufficient documentation

## 2017-07-25 DIAGNOSIS — Z87891 Personal history of nicotine dependence: Secondary | ICD-10-CM | POA: Insufficient documentation

## 2017-07-25 DIAGNOSIS — R55 Syncope and collapse: Secondary | ICD-10-CM | POA: Diagnosis present

## 2017-07-25 DIAGNOSIS — Z888 Allergy status to other drugs, medicaments and biological substances status: Secondary | ICD-10-CM | POA: Insufficient documentation

## 2017-07-25 DIAGNOSIS — I639 Cerebral infarction, unspecified: Secondary | ICD-10-CM

## 2017-07-25 DIAGNOSIS — W108XXA Fall (on) (from) other stairs and steps, initial encounter: Secondary | ICD-10-CM

## 2017-07-25 DIAGNOSIS — M109 Gout, unspecified: Secondary | ICD-10-CM | POA: Diagnosis not present

## 2017-07-25 DIAGNOSIS — Z886 Allergy status to analgesic agent status: Secondary | ICD-10-CM | POA: Insufficient documentation

## 2017-07-25 LAB — CBC
HEMATOCRIT: 38.6 % (ref 35.0–47.0)
HEMOGLOBIN: 13.4 g/dL (ref 12.0–16.0)
MCH: 30.8 pg (ref 26.0–34.0)
MCHC: 34.7 g/dL (ref 32.0–36.0)
MCV: 88.9 fL (ref 80.0–100.0)
Platelets: 144 10*3/uL — ABNORMAL LOW (ref 150–440)
RBC: 4.34 MIL/uL (ref 3.80–5.20)
RDW: 14.7 % — ABNORMAL HIGH (ref 11.5–14.5)
WBC: 9.6 10*3/uL (ref 3.6–11.0)

## 2017-07-25 LAB — GLUCOSE, CAPILLARY
GLUCOSE-CAPILLARY: 108 mg/dL — AB (ref 65–99)
Glucose-Capillary: 123 mg/dL — ABNORMAL HIGH (ref 65–99)
Glucose-Capillary: 137 mg/dL — ABNORMAL HIGH (ref 65–99)
Glucose-Capillary: 102 mg/dL — ABNORMAL HIGH (ref 65–99)
Glucose-Capillary: 143 mg/dL — ABNORMAL HIGH (ref 65–99)

## 2017-07-25 LAB — COMPREHENSIVE METABOLIC PANEL
ALBUMIN: 4.8 g/dL (ref 3.5–5.0)
ALT: 35 U/L (ref 14–54)
AST: 32 U/L (ref 15–41)
Alkaline Phosphatase: 104 U/L (ref 38–126)
Anion gap: 11 (ref 5–15)
BILIRUBIN TOTAL: 0.8 mg/dL (ref 0.3–1.2)
BUN: 30 mg/dL — AB (ref 6–20)
CALCIUM: 10.3 mg/dL (ref 8.9–10.3)
CO2: 23 mmol/L (ref 22–32)
Chloride: 103 mmol/L (ref 101–111)
Creatinine, Ser: 1.23 mg/dL — ABNORMAL HIGH (ref 0.44–1.00)
GFR calc Af Amer: 54 mL/min — ABNORMAL LOW (ref >60)
GFR, EST NON AFRICAN AMERICAN: 47 mL/min — AB (ref >60)
GLUCOSE: 126 mg/dL — AB (ref 65–99)
Potassium: 4.3 mmol/L (ref 3.5–5.1)
Sodium: 137 mmol/L (ref 135–145)
TOTAL PROTEIN: 8.1 g/dL (ref 6.5–8.1)

## 2017-07-25 LAB — TROPONIN I: Troponin I: <0.03 ng/mL (ref <0.03)

## 2017-07-25 MED ORDER — LORATADINE 10 MG PO TABS
10.0000 mg | ORAL_TABLET | Freq: Every day | ORAL | Status: DC
  Administered 2017-07-26: 10 mg via ORAL
  Filled 2017-07-25 (×2): qty 1

## 2017-07-25 MED ORDER — ACETAMINOPHEN 325 MG PO TABS: 650.0000 mg | ORAL_TABLET | ORAL | Status: DC | PRN

## 2017-07-25 MED ORDER — PROMETHAZINE HCL 25 MG PO TABS: 25.0000 mg | ORAL_TABLET | ORAL | Status: DC | PRN

## 2017-07-25 MED ORDER — ASPIRIN 81 MG PO CHEW
324.0000 mg | CHEWABLE_TABLET | Freq: Once | ORAL | Status: AC
  Administered 2017-07-25: 324 mg via ORAL
  Filled 2017-07-25: qty 4

## 2017-07-25 MED ORDER — SODIUM CHLORIDE 0.9 % IV BOLUS (SEPSIS)
1000.0000 mL | Freq: Once | INTRAVENOUS | Status: AC
  Administered 2017-07-25: 1000 mL via INTRAVENOUS

## 2017-07-25 MED ORDER — INSULIN ASPART 100 UNIT/ML ~~LOC~~ SOLN
0.0000 [IU] | Freq: Three times a day (TID) | SUBCUTANEOUS | Status: DC
  Administered 2017-07-26: 2 [IU] via SUBCUTANEOUS
  Filled 2017-07-25: qty 1

## 2017-07-25 MED ORDER — PROMETHAZINE HCL 25 MG PO TABS
25.0000 mg | ORAL_TABLET | Freq: Three times a day (TID) | ORAL | Status: DC | PRN
  Administered 2017-07-25 – 2017-07-26 (×2): 25 mg via ORAL
  Filled 2017-07-25 (×2): qty 1

## 2017-07-25 MED ORDER — DIPHENOXYLATE-ATROPINE 2.5-0.025 MG PO TABS: 2.0000 | ORAL_TABLET | Freq: Every day | ORAL | Status: DC | PRN

## 2017-07-25 MED ORDER — ACETAMINOPHEN 160 MG/5ML PO SOLN
650.0000 mg | ORAL | Status: DC | PRN
  Filled 2017-07-25: qty 20.3

## 2017-07-25 MED ORDER — ENOXAPARIN SODIUM 40 MG/0.4ML ~~LOC~~ SOLN: 40.0000 mg | SUBCUTANEOUS | Status: DC

## 2017-07-25 MED ORDER — STROKE: EARLY STAGES OF RECOVERY BOOK
Freq: Once | Status: AC
  Administered 2017-07-25: 13:00:00

## 2017-07-25 MED ORDER — BISOPROLOL FUMARATE 5 MG PO TABS
10.0000 mg | ORAL_TABLET | Freq: Every day | ORAL | Status: DC
  Administered 2017-07-26: 10 mg via ORAL
  Filled 2017-07-25: qty 2

## 2017-07-25 MED ORDER — ONDANSETRON HCL 4 MG/2ML IJ SOLN: 4.0000 mg | Freq: Four times a day (QID) | INTRAMUSCULAR | Status: DC | PRN

## 2017-07-25 MED ORDER — ZOLPIDEM TARTRATE 5 MG PO TABS
5.0000 mg | ORAL_TABLET | Freq: Every evening | ORAL | Status: DC | PRN
  Administered 2017-07-26: 5 mg via ORAL
  Filled 2017-07-25: qty 1

## 2017-07-25 MED ORDER — DULOXETINE HCL 30 MG PO CPEP
60.0000 mg | ORAL_CAPSULE | Freq: Every day | ORAL | Status: DC
  Administered 2017-07-26: 60 mg via ORAL
  Filled 2017-07-25: qty 2

## 2017-07-25 MED ORDER — HYDROMORPHONE HCL 1 MG/ML IJ SOLN
0.5000 mg | Freq: Four times a day (QID) | INTRAMUSCULAR | Status: DC | PRN
  Administered 2017-07-25 – 2017-07-26 (×4): 0.5 mg via INTRAVENOUS
  Filled 2017-07-25 (×4): qty 1

## 2017-07-25 MED ORDER — FENTANYL CITRATE (PF) 100 MCG/2ML IJ SOLN
50.0000 ug | Freq: Once | INTRAMUSCULAR | Status: AC
  Administered 2017-07-25: 50 ug via INTRAVENOUS
  Filled 2017-07-25: qty 2

## 2017-07-25 MED ORDER — ENOXAPARIN SODIUM 40 MG/0.4ML ~~LOC~~ SOLN
40.0000 mg | SUBCUTANEOUS | Status: DC
  Administered 2017-07-25: 40 mg via SUBCUTANEOUS
  Filled 2017-07-25: qty 0.4

## 2017-07-25 MED ORDER — POLYETHYLENE GLYCOL 3350 17 G PO PACK: 17.0000 g | PACK | Freq: Every day | ORAL | Status: DC | PRN

## 2017-07-25 MED ORDER — ACETAMINOPHEN 650 MG RE SUPP: 650.0000 mg | RECTAL | Status: DC | PRN

## 2017-07-25 MED ORDER — HAIR/SKIN/NAILS/BIOTIN PO TABS: ORAL_TABLET | Freq: Three times a day (TID) | ORAL | Status: DC

## 2017-07-25 MED ORDER — ALLOPURINOL 100 MG PO TABS
100.0000 mg | ORAL_TABLET | Freq: Every day | ORAL | Status: DC
  Administered 2017-07-25 – 2017-07-26 (×2): 100 mg via ORAL
  Filled 2017-07-25 (×2): qty 1

## 2017-07-25 MED ORDER — FENTANYL 25 MCG/HR TD PT72: 25.0000 ug | MEDICATED_PATCH | TRANSDERMAL | Status: DC

## 2017-07-25 MED ORDER — ASPIRIN 81 MG PO CHEW
81.0000 mg | CHEWABLE_TABLET | Freq: Every day | ORAL | Status: DC
  Administered 2017-07-25 – 2017-07-26 (×2): 81 mg via ORAL
  Filled 2017-07-25 (×2): qty 1

## 2017-07-25 MED ORDER — PANTOPRAZOLE SODIUM 40 MG PO TBEC
40.0000 mg | DELAYED_RELEASE_TABLET | Freq: Every day | ORAL | Status: DC
  Administered 2017-07-26: 40 mg via ORAL
  Filled 2017-07-25: qty 1

## 2017-07-25 MED ORDER — BIOTIN 1 MG PO CAPS: 1.0000 mg | ORAL_CAPSULE | Freq: Every day | ORAL | Status: DC

## 2017-07-25 MED ORDER — HYDROMORPHONE HCL 1 MG/ML IJ SOLN
0.5000 mg | Freq: Once | INTRAMUSCULAR | Status: AC
  Administered 2017-07-25: 0.5 mg via INTRAVENOUS
  Filled 2017-07-25: qty 1

## 2017-07-25 MED ORDER — ONDANSETRON HCL 4 MG PO TABS: 4.0000 mg | ORAL_TABLET | Freq: Four times a day (QID) | ORAL | Status: DC | PRN

## 2017-07-25 NOTE — ED Notes (Signed)
Patient transported to X-ray 

## 2017-07-25 NOTE — H&P (Signed)
[facility] NAME: Sarah [name]    MR#:  [id number]  DATE OF BIRTH:  1956/01/17  DATE OF ADMISSION:  07/25/2017  PRIMARY CARE PHYSICIAN: Glean Hess, MD   REQUESTING/REFERRING PHYSICIAN: dr Mariea Clonts  CHIEF COMPLAINT:   Falls with left sided facial/leg weakness HISTORY OF PRESENT ILLNESS:  Sarah [name]  is a 62 y.o. female with a known history of CKD stage 3, mild nonrheumatic aortic valve stenosis, chronic diastolic heart failure who presents today due to multiple falls and leg pain. Patient has had several falls in the past however yesterday she had a significant fall which left her with a syncopal event. Prior to this event she was in her usual state of health. She denies any chest pain or shortness of breath. She does have known aortic valve stenosis from bicuspid valve disease however has not had any syncopal episodes. She came to very quickly. But since that time she has noticed increasing lower back pain as well as left leg numbness. She reports some numbness of her face. She denies any other neurological deficits. She had recent back surgery in November and she takes chronic narcotics for her chronic back pain.  CT the head was negative for acute intracranial hemorrhage or CVA. ED physician was worried about a TIA/CVA. Neurologist has been consulted by the ED physician.  PAST MEDICAL HISTORY:   Past Medical History:  Diagnosis Date  . Anemia   . Anxiety   . Arthritis   . Bicuspid aortic valve   . CHF (congestive heart failure) ([place])   . CKD (chronic kidney disease) stage 3, GFR 30-59 ml/min ([facility])   . Depression   . Diabetes mellitus ([place])   . Dizziness   . Fatty liver   . Frequent falls   . GERD (gastroesophageal reflux disease)   . Gout   . Heart murmur   . History of blood transfusion   . History of bone marrow transplant ([place])   . History of uterine fibroid   . Hypertension   . Hypomagnesemia   .  Multiple myeloma ([place])   . Personal history of chemotherapy   . Renal cyst     PAST SURGICAL HISTORY:   Past Surgical History:  Procedure Laterality Date  . ABDOMINAL HYSTERECTOMY    . 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 CARDIAC ELECTROPHYSIOLOGY MAPPING AND ABLATION    . CARPAL TUNNEL RELEASE Bilateral   . CHOLECYSTECTOMY  2008  . COLONOSCOPY WITH PROPOFOL N/A 05/07/2017   Procedure: COLONOSCOPY WITH PROPOFOL;  Surgeon: [name], MD;  Location: [facility] ENDOSCOPY;  Service: Gastroenterology;  Laterality: N/A;  . ESOPHAGOGASTRODUODENOSCOPY (EGD) WITH PROPOFOL N/A 05/07/2017   Procedure: ESOPHAGOGASTRODUODENOSCOPY (EGD) WITH PROPOFOL;  Surgeon: [name], MD;  Location[facility] END[facility]COPY;  Service: Gastroenterology;  Laterality: N/A;  . FOOT SURGERY Bilateral   . INCONTINENCE SURGERY  2009  . PARTIAL HYSTERECTOMY  03/1996   fibroids  . TONSILLECTOMY  2007    SOCIAL HISTORY:   Social History   Tobacco Use  . Smoking status: Former Smoker    Packs/day: 1.00    Years: 20.00    Pack years: 20.00    Types: Cigarettes    Last attempt to quit: 07/02/1991    Years since quitting: 26.0  . Smokeless tobacco: Never Used  Substance Use Topics  . Alcohol use: Yes    Alcohol/week: 1.2 oz    Types: 1 Glasses of wine, 1 Cans of beer  per week    Comment: occassional    FAMILY HISTORY:   Family History  Problem Relation Age of Onset  . Colon cancer Father   . Renal Disease Father   . Diabetes Mellitus II Father   . Melanoma Paternal Grandmother   . Breast cancer Maternal Aunt 41  . Anemia Mother   . Heart disease Mother   . Heart failure Mother   . Renal Disease Mother   . Congestive Heart Failure Mother   . Heart disease Maternal Uncle   . Throat cancer Maternal Uncle   . Lung cancer Maternal Uncle   . Liver disease Maternal Uncle   . Heart failure Maternal Uncle   . Hearing loss Son 18       Suicide     DRUG ALLERGIES:   Allergies  Allergen Reactions  .  Oxycodone-Acetaminophen Anaphylaxis    Swelling and rash  . Benadryl [Diphenhydramine] Palpitations  . Morphine Itching and Rash  . Ondansetron Diarrhea    CAUSES PATIENT DIARRHEA CAUSES PATIENT DIARRHEA CAUSES PATIENT DIARRHEA CAUSES PATIENT DIARRHEA CAUSES PATIENT DIARRHEA  . Tylenol [Acetaminophen] Itching and Rash    REVIEW OF SYSTEMS:   Review of Systems  Constitutional: Negative.  Negative for chills, fever and malaise/fatigue.  HENT: Negative.  Negative for ear discharge, ear pain, hearing loss, nosebleeds and sore throat.   Eyes: Negative.  Negative for blurred vision and pain.  Respiratory: Negative.  Negative for cough, hemoptysis, shortness of breath and wheezing.   Cardiovascular: Negative.  Negative for chest pain, palpitations and leg swelling.  Gastrointestinal: Negative.  Negative for abdominal pain, blood in stool, diarrhea, nausea and vomiting.  Genitourinary: Negative.  Negative for dysuria.  Musculoskeletal: Positive for back pain and falls.  Skin: Negative.   Neurological: Positive for dizziness and sensory change. Negative for tremors, speech change, focal weakness, seizures and headaches.  Endo/Heme/Allergies: Negative.  Does not bruise/bleed easily.  Psychiatric/Behavioral: Negative.  Negative for depression, hallucinations and suicidal ideas.    MEDICATIONS AT HOME:   Prior to Admission medications   Medication Sig Start Date End Date Taking? Authorizing Provider  acyclovir (ZOVIRAX) 200 MG capsule TAKE 2 CAPSULES(400 MG) BY MOUTH THREE TIMES DAILY 05/30/17  Yes Glean Hess, MD  allopurinol (ZYLOPRIM) 100 MG tablet TAKE 1 TABLET(100 MG) BY MOUTH DAILY Patient taking differently: TAKE 1 TABLET(100 MG) BY MOUTH DAILY as needed 05/30/17  Yes Glean Hess, MD  Biotin 1 MG CAPS Take 1 mg by mouth daily.    Yes [provider]  bisoprolol (ZEBETA) 10 MG tablet TAKE 1 TABLET(10 MG) BY MOUTH DAILY 05/30/17  Yes Glean Hess, MD   cholestyramine Lucrezia Starch) 4 g packet Take 4 g once daily. Patient taking differently: Take 4 g by mouth daily as needed. Take 4 g once daily. 06/18/16  Yes Cammie Sickle, MD  diphenoxylate-atropine (LOMOTIL) 2.5-0.025 MG tablet Take 2 tablets by mouth daily as needed for diarrhea or loose stools. 09/24/16  Yes Cammie Sickle, MD  DULoxetine (CYMBALTA) 60 MG capsule TAKE 1 CAPSULE(60 MG) BY MOUTH TWICE DAILY 07/10/17  Yes Glean Hess, MD  fentaNYL (DURAGESIC - DOSED MCG/HR) 25 MCG/HR patch Place 25 mcg onto the skin every 3 (three) days. 08/12/16  Yes [provider]  fexofenadine (ALLEGRA) 180 MG tablet TAKE 1 TABLET(180 MG) BY MOUTH DAILY Patient taking differently: TAKE 1 TABLET(180 MG) BY MOUTH DAILY as needed 05/30/17  Yes Glean Hess, MD  fluticasone [facility]) 50 MCG/ACT nasal  spray Place 2 sprays into both nostrils daily. Patient taking differently: Place 2 sprays into both nostrils daily as needed.  05/30/16  Yes Glean Hess, MD  furosemide (LASIX) 20 MG tablet Take 0.5 tablets (10 mg total) by mouth daily as needed. 05/30/16  Yes Glean Hess, MD  hydrOXYzine (ATARAX/VISTARIL) 10 MG tablet TAKE 1 TABLET(10 MG) BY MOUTH THREE TIMES DAILY AS NEEDED 05/30/17  Yes Glean Hess, MD  montelukast (SINGULAIR) 10 MG tablet Take 1 tablet (10 mg total) by mouth at bedtime. Patient taking differently: Take 10 mg by mouth at bedtime as needed.  05/30/16  Yes Glean Hess, MD  Multiple Vitamins-Minerals (HAIR/SKIN/NAILS/BIOTIN PO) Take 1 capsule by mouth 3 (three) times daily.   Yes [provider]  omeprazole (PRILOSEC) 40 MG capsule Take 1 capsule (40 mg total) by mouth 2 (two) times daily. Patient taking differently: Take 40 mg by mouth daily.  04/09/17  Yes Glean Hess, MD  promethazine (PHENERGAN) 25 MG tablet TAKE 1 TO 2 TABLETS BY MOUTH TWICE DAILY BEFORE MEALS AS NEEDED 07/09/17  Yes Cammie Sickle, MD  tiZANidine  (ZANAFLEX) 4 MG tablet TAKE 1 TABLET BY MOUTH EVERY 8 HOURS AS NEEDED, MUSCLE RELAXER FOR BACK PAIN 05/30/17  Yes Glean Hess, MD  traZODone (DESYREL) 50 MG tablet Take 50 mg by mouth at bedtime as needed.  05/30/16  Yes [provider]  zaleplon (SONATA) 10 MG capsule TAKE 1 CAPSULE BY MOUTH EVERY DAY AT BEDTIME AS NEEDED FOR SLEEP 02/07/17  Yes Glean Hess, MD  glucose blood (ONE TOUCH ULTRA TEST) test strip  09/04/15   [provider]      VITAL SIGNS:  Blood pressure 134/82, pulse 71, temperature 98.7 F (37.1 C), temperature source Oral, resp. rate 12, height 5' 2.5" (1.588 m), weight 68 kg (150 lb), SpO2 99 %.  PHYSICAL EXAMINATION:   Physical Exam  Constitutional: She is oriented to person, place, and time and well-developed, well-nourished, and in no distress. No distress.  HENT:  Head: Normocephalic.  Eyes: No scleral icterus.  Neck: Normal range of motion. Neck supple. No JVD present. No tracheal deviation present.  Cardiovascular: Normal rate and regular rhythm. Exam reveals no gallop and no friction rub.  Murmur heard. Pulmonary/Chest: Effort normal and breath sounds normal. No respiratory distress. She has no wheezes. She has no rales. She exhibits no tenderness.  Abdominal: Soft. Bowel sounds are normal. She exhibits no distension and no mass. There is no tenderness. There is no rebound and no guarding.  Musculoskeletal: Normal range of motion. She exhibits no edema, tenderness or deformity.  Neurological: She is alert and oriented to person, place, and time.  Skin: Skin is warm. No rash noted. No erythema.  She has a bruise on her left ankle from her fall  Psychiatric: Affect and judgment normal.      LABORATORY PANEL:   CBC Recent Labs  Lab 07/25/17 0948  WBC 9.6  HGB 13.4  HCT 38.6  PLT 144*   ------------------------------------------------------------------------------------------------------------------  Chemistries  Recent  Labs  Lab 07/25/17 0948  NA 137  K 4.3  CL 103  CO2 23  GLUCOSE 126*  BUN 30*  CREATININE 1.23*  CALCIUM 10.3  AST 32  ALT 35  ALKPHOS 104  BILITOT 0.8   ------------------------------------------------------------------------------------------------------------------  Cardiac Enzymes Recent Labs  Lab 07/25/17 0948  TROPONINI <0.03   ------------------------------------------------------------------------------------------------------------------  RADIOLOGY:  Ct Head Wo Contrast  Result Date: 07/25/2017 CLINICAL DATA:  62 year old female with dizziness, syncope and fall yesterday. EXAM: CT HEAD WITHOUT CONTRAST TECHNIQUE: Contiguous axial images were obtained from the base of the skull through the vertex without intravenous contrast. COMPARISON:  07/24/2016 MR FINDINGS: Brain: No evidence of acute infarction, hemorrhage, hydrocephalus, extra-axial collection or mass lesion/mass effect. Mild atrophy noted. Vascular: Mild atherosclerotic calcifications noted. Skull: Normal. Negative for fracture or focal lesion. Sinuses/Orbits: No acute finding. Other: None. IMPRESSION: No evidence of acute intracranial abnormality Mild atrophy Electronically Signed   By: Margarette Canada M.D.   On: 07/25/2017 11:04   Dg Hip Unilat W Or Wo Pelvis 2-3 Views Left  Result Date: 07/25/2017 CLINICAL DATA:  Acute left hip pain following fall yesterday. Initial encounter. EXAM: DG HIP (WITH OR WITHOUT PELVIS) 2-3V LEFT COMPARISON:  None. FINDINGS: There is no evidence of hip fracture or dislocation. There is no evidence of arthropathy or other focal bone abnormality. IMPRESSION: Negative. Electronically Signed   By: Margarette Canada M.D.   On: 07/25/2017 11:51    EKG:   pending IMPRESSION AND PLAN:   62 year old female with bicuspid aortic valve with moderate stenosis, GERD, chronic kidney disease stage III and chronic diastolic heart failure who presents after a fall complaining of numbness of her left leg and  possible numbness of her face.  1. Left leg numbness: I would be more concerned that this may be related to her history of back surgery and back issues CVA workup ordered including MRI/MRA, carotid Doppler and echocardiogram Neuro checks along with PT, OT and neurology consultation requested I will also order MRI lumbar spine Aspirin 81 mg started Lipid panel and A1c pending  2. Syncope with history of moderate aortic valve stenosis and bicuspid valve with history of frequent falls Follow up on echocardiogram and carotid Doppler [facility] cardiology consultation requested Orthostatic vital signs ordered Hold Lasix for now  3. Depression: Continue Cymbalta  4. Chronic pain syndrome: Continue fentanyl patch, Zanaflex  5. Diet-controlled diabetes: Sliding scale ordered  6. Chronic diastolic heart failure without signs of exacerbation: Hold Lasix due to syncope but would monitor for fluid overload Currently euvolemic  7. Chronic kidney disease stage III: Creatinine is at baseline     All the records are reviewed and case discussed with ED provider. Management plans discussed with the patient and she is in agreement  CODE STATUS: full  TOTAL TIME TAKING CARE OF THIS PATIENT: 41 minutes.    Sarah [name] M.D on 07/25/2017 at 11:54 AM  Between 7am to 6pm - Pager - [phone]  After 6pm go to [email or web address] - password [email or web address] [facility]  Office  [phone]  CC: Primary care physician; Glean Hess, MD

## 2017-07-25 NOTE — ED Notes (Signed)
Dr. Norman at bedside.  

## 2017-07-25 NOTE — Progress Notes (Addendum)
OT Cancellation Note  Patient Details Name: Sarah [name] MRN: [id number] DOB: 11-25-1955   Cancelled Treatment:    Reason Eval/Treat Not Completed: Patient at procedure or test/ unavailable(Pt. out of room for testing this afternoon. WIll conitnue to monitor, and eval at a later time/date when available.)  Harrel Carina, MS, OTR/L 07/25/2017, 2:15 PM

## 2017-07-25 NOTE — ED Notes (Signed)
Hospitalist to bedside at this time 

## 2017-07-25 NOTE — ED Provider Notes (Signed)
[facility] Emergency Department Provider Note  ____________________________________________  Time seen: Approximately 10:01 AM  I have reviewed the triage vital signs and the nursing notes.   HISTORY  Chief Complaint Fall and Loss of Consciousness    HPI Sarah [name] is a 62 y.o. female with a history of frequent falls and chronic dizziness requiring remote rehab, multiple myeloma status post bone marrow transplant in remission, CHF with bicuspid aortic valve, presenting with presyncopal episode and left ankle knee and hip, left lateral chest wall pain after a fall.  The patient reports that yesterday she was standing in her kitchen when she developed acute onset lightheadedness and dizziness, typical of her chronic dizziness syndrome.  She then tried to ambulate down a step to the den, and began to have a blackening of her vision with a presyncopal episode where she fell against a bar on her left side and down to the ground.  She did not lose consciousness or have any tonic-clonic activity.  No urinary or fecal incontinence.  She laid on the ground for 30 minutes and then was able to get up but has noticed that she has been having pain and ecchymosis in the left lateral ankle, pain in the left knee and hip, left lateral neck, and along the left lateral chest wall.  She has not had any shortness of breath, chest pain, palpitations.  She has had some associated tingling in the entirety of the left lower extremity as well as the left face.  She has not had any visual changes, speech changes or mental status changes.  She has not tried anything for her pain.  The patient does have chronic pain in the right upper extremity after a fall in December which has been fully evaluated with her primary care physician without any known injury.  Past Medical History:  Diagnosis Date  . Anemia   . Anxiety   . Arthritis   . Bicuspid aortic valve   . CHF (congestive heart failure)  ([place])   . CKD (chronic kidney disease) stage 3, GFR 30-59 ml/min (HCC)   . Depression   . Diabetes mellitus ([place])   . Dizziness   . Fatty liver   . Frequent falls   . GERD (gastroesophageal reflux disease)   . Gout   . Heart murmur   . History of blood transfusion   . History of bone marrow transplant ([place])   . History of uterine fibroid   . Hypertension   . Hypomagnesemia   . Multiple myeloma ([place])   . Personal history of chemotherapy   . Renal cyst     Patient Active Problem List   Diagnosis Date Noted  . TIA (transient ischemic attack) 07/25/2017  . Iron deficiency anemia due to chronic blood loss 05/13/2017  . Spinal stenosis of lumbar region with neurogenic claudication 04/09/2017  . Magnesium deficiency 04/08/2017  . 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 Dizziness 10/10/2016  . LBBB (left bundle branch block) 10/10/2016  . Long term prescription opiate use 09/07/2016  . Long term current use of opiate analgesic 07/09/2016  . Chronic pain syndrome 07/08/2016  . Pain medication agreement signed 07/08/2016  . Spondylosis of lumbar region without myelopathy or radiculopathy 07/08/2016  . Lumbar spondylosis 06/06/2016  . Chronic diastolic CHF (congestive heart failure), NYHA class 2 ([place]) 06/05/2016  . Hx of cardiac catheterization 06/05/2016  . LVH (left ventricular hypertrophy) due to hypertensive disease, with heart failure ([place]) 06/05/2016  .  Mild aortic valve stenosis 06/05/2016  . 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[place]) 06/05/2016  . Sinus congestion 05/30/2016  . Environmental and seasonal allergies 04/19/2016  . Insomnia 04/19/2016  . Bicuspid aortic valve 04/19/2016  . Asthma 04/19/2016  . Aortic regurgitation 04/19/2016  . History of CHF (congestive heart failure) 04/19/2016  . DM (diabetes mellitus), type 2 with renal complications ([place]) 02/63/7858  . Anemia 04/12/2016  . GERD (gastroesophageal reflux disease) 04/12/2016  . Depression  with anxiety 04/12/2016  . Irritable bowel syndrome (IBS) 04/12/2016  . Hepatic steatosis 04/12/2016  . Multiple myeloma in remission ([place]) 04/02/2016  . Abnormal stress test 02/14/2016  . 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[place]) 07/06/2015  . Colitis 02/14/2013  . Disequilibrium 01/14/2013    Past Surgical History:  Procedure Laterality Date  . ABDOMINAL HYSTERECTOMY    . 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 CARDIAC ELECTROPHYSIOLOGY MAPPING AND ABLATION    . CARPAL TUNNEL RELEASE Bilateral   . CHOLECYSTECTOMY  2008  . COLONOSCOPY WITH PROPOFOL N/A 05/07/2017   Procedure: COLONOSCOPY WITH PROPOFOL;  Surgeon: [name], MD;  Location: [facility] ENDOSCOPY;  Service: Gastroenterology;  Laterality: N/A;  . ESOPHAGOGASTRODUODENOSCOPY (EGD) WITH PROPOFOL N/A 05/07/2017   Procedure: ESOPHAGOGASTRODUODENOSCOPY (EGD) WITH PROPOFOL;  Surgeon: [name], MD;  Location: [facility] ENDOSCOPY;  Service: Gastroenterology;  Laterality: N/A;  . FOOT SURGERY Bilateral   . INCONTINENCE SURGERY  2009  . PARTIAL HYSTERECTOMY  03/1996   fibroids  . TONSILLECTOMY  2007      Allergies Oxycodone-acetaminophen; Benadryl [diphenhydramine]; Morphine; Ondansetron; and Tylenol [acetaminophen]  Family History  Problem Relation Age of Onset  . Colon cancer Father   . Renal Disease Father   . Diabetes Mellitus II Father   . Melanoma Paternal Grandmother   . Breast cancer Maternal Aunt 18  . Anemia Mother   . Heart disease Mother   . Heart failure Mother   . Renal Disease Mother   . Congestive Heart Failure Mother   . Heart disease Maternal Uncle   . Throat cancer Maternal Uncle   . Lung cancer Maternal Uncle   . Liver disease Maternal Uncle   . Heart failure Maternal Uncle   . Hearing loss Son 57       Suicide     Social History Social History   Tobacco Use  . Smoking status: Former Smoker    Packs/day: 1.00    Years: 20.00    Pack years: 20.00    Types: Cigarettes    Last attempt to  quit: 07/02/1991    Years since quitting: 26.0  . Smokeless tobacco: Never Used  Substance Use Topics  . Alcohol use: Yes    Alcohol/week: 1.2 oz    Types: 1 Glasses of wine, 1 Cans of beer per week    Comment: occassional  . Drug use: No    Review of Systems Constitutional: No fever/chills.  Positive lightheadedness and dizziness.  Positive presyncope.  Positive fall with associated trauma. Eyes: Positive blackening of vision, now resolved.  No double or blurred vision. ENT: No sore throat. No congestion or rhinorrhea. Cardiovascular: Denies chest pain. Denies palpitations. Respiratory: Denies shortness of breath.  No cough. Gastrointestinal: No abdominal pain.  No nausea, no vomiting.  No diarrhea.  No constipation. Genitourinary: Negative for dysuria. Musculoskeletal: Positive for neck and back pain.  Positive for pain in the left hip, knee and ankle.  Positive for chronic unchanged pain in the right upper extremity. Skin: Negative for rash. Neurological: Negative  for headaches.  Positive unstable gait at baseline.  Positive left facial and left lower extremity numbness with tingling in the toes.      ____________________________________________   PHYSICAL EXAM:  VITAL SIGNS: ED Triage Vitals  Enc Vitals Group     BP 07/25/17 0937 131/79     Pulse Rate 07/25/17 0937 75     Resp --      Temp 07/25/17 0937 98.7 F (37.1 C)     Temp Source 07/25/17 0937 Oral     SpO2 07/25/17 0937 99 %     Weight 07/25/17 0938 150 lb (68 kg)     Height 07/25/17 0938 5' 2.5" (1.588 m)     Head Circumference --      Peak Flow --      Pain Score 07/25/17 0938 7     Pain Loc --      Pain Edu? --      Excl. in [place]? --     Constitutional: The patient is alert and oriented and answering questions appropriately.  She is chronically ill-appearing but nontoxic.  GCS is 15.   Eyes: Conjunctivae are normal.  EOMI. PERRLA.  No scleral icterus. Head: Atraumatic.  No raccoon eyes.  No battle  sign. Nose: No congestion/rhinnorhea.  No swelling over the nose.  No septal hematoma. Mouth/Throat: Mucous membranes are moist.  No malocclusion or dental injury.  Neck: No stridor.  Supple.  The patient does have diffuse nonfocal tenderness to palpation in the midline of the cervical spine as well as left lateral neck pain.  She has no overlying ecchymosis or swelling. Cardiovascular: Normal rate, regular rhythm. No murmurs, rubs or gallops.  Respiratory: Normal respiratory effort.  No accessory muscle use or retractions. Lungs CTAB.  No wheezes, rales or ronchi. Gastrointestinal: Soft, nontender and nondistended.  No guarding or rebound.  No peritoneal signs. Musculoskeletal: The patient has old bruising with some minimal swelling over the left lateral malleolus with pain with range of motion.  She has no obvious injury externally to the foot.  She has pain with range of motion of the left knee without effusion.  She has pain with range of motion and palpation over the greater trochanter in the left hip.  The patient has tenderness to palpation in the left lower chest wall without any bruising, swelling or crepitus.  She has upper thoracic tenderness to palpation which is diffuse and nonfocal; no palpable step-offs or deformities.  No lumbar spine tenderness to palpation.  Full range of motion without pain of the bilateral wrists, elbows, left shoulder, right hip, right knee, right ankle.  She does have some chronic unchanged discomfort with range of motion of the right shoulder.  Normal DP and PT pulses as well as radial pulses bilaterally. Neurologic: Alert and oriented 3. Speech is clear. . Face and smile symmetric. Tongue is midline.  EOMI, PERRLA.  No horizontal or vertical nystagmus.  No pronator drift. 5 out of 5 grip, biceps, left triceps, hip flexors, plantar flexion and dorsiflexion.  4+ out of 5 right triceps, the patient's examination is limited due to pain.  4+/5 right triceps, limited due to  pain. Normal sensation to light touch in the bilateral upper and right lower extremities, and right face.  Skin:  Skin is warm, dry and intact. No rash noted. Psychiatric: Mood and affect are normal.   ____________________________________________   LABS (all labs ordered are listed, but only abnormal results are displayed)  Labs Reviewed  CBC -  Abnormal; Notable for the following components:      Result Value   RDW 14.7 (*)    Platelets 144 (*)    All other components within normal limits  COMPREHENSIVE METABOLIC PANEL - Abnormal; Notable for the following components:   Glucose, Bld 126 (*)    BUN 30 (*)    Creatinine, Ser 1.23 (*)    GFR calc non Af Amer 47 (*)    GFR calc Af Amer 54 (*)    All other components within normal limits  GLUCOSE, CAPILLARY - Abnormal; Notable for the following components:   Glucose-Capillary 137 (*)    All other components within normal limits  GLUCOSE, CAPILLARY - Abnormal; Notable for the following components:   Glucose-Capillary 123 (*)    All other components within normal limits  TROPONIN I  URINALYSIS, COMPLETE (UACMP) WITH MICROSCOPIC  HIV ANTIBODY (ROUTINE TESTING)  CBC  CREATININE, SERUM  CBG MONITORING, ED   ____________________________________________  EKG  ED ECG REPORT I, Eula Listen, the attending physician, personally viewed and interpreted this ECG.   Date: 07/25/2017  EKG Time: 955   Rate: 75  Rhythm: normal sinus rhythm, LBBB  Axis: normal  Intervals:none  ST&T Change: No STEMI  ____________________________________________  RADIOLOGY  Dg Chest 2 View  Result Date: 07/25/2017 CLINICAL DATA:  Acute chest pain following fall yesterday. Initial encounter. EXAM: CHEST  2 VIEW COMPARISON:  05/21/2016 radiographs FINDINGS: The cardiomediastinal silhouette is unremarkable. There is no evidence of focal airspace disease, pulmonary edema, suspicious pulmonary nodule/mass, pleural effusion, or pneumothorax. No  acute bony abnormalities are identified. IMPRESSION: No active cardiopulmonary disease. Electronically Signed   By: Margarette Canada M.D.   On: 07/25/2017 11:53   Dg Cervical Spine Complete  Result Date: 07/25/2017 CLINICAL DATA:  Acute neck pain following fall yesterday. Initial encounter. EXAM: CERVICAL SPINE - COMPLETE 4+ VIEW COMPARISON:  05/28/2017 FINDINGS: There is no evidence of acute fracture, subluxation or prevertebral soft tissue swelling. Mild degenerative disc disease and spondylosis in the mid-lower cervical spine noted. No focal bony lesions are noted. IMPRESSION: No acute abnormality. Electronically Signed   By: Margarette Canada M.D.   On: 07/25/2017 11:56   Dg Thoracic Spine 2 View  Result Date: 07/25/2017 CLINICAL DATA:  Acute mid back pain following fall yesterday. Initial encounter. EXAM: THORACIC SPINE 2 VIEWS COMPARISON:  05/28/2017 FINDINGS: No acute fracture or subluxation identified. Anterior and lateral spondylosis again noted. No suspicious focal bony lesions are present. IMPRESSION: No acute abnormalities. Electronically Signed   By: Margarette Canada M.D.   On: 07/25/2017 11:55   Dg Ankle Complete Left  Result Date: 07/25/2017 CLINICAL DATA:  Acute left ankle pain following fall yesterday. Initial encounter. EXAM: LEFT ANKLE COMPLETE - 3+ VIEW COMPARISON:  None. FINDINGS: No acute fracture, subluxation or dislocation. The joint spaces are unremarkable. A small calcaneal spur is present. IMPRESSION: No acute bony abnormality. Electronically Signed   By: Margarette Canada M.D.   On: 07/25/2017 11:52   Ct Head Wo Contrast  Result Date: 07/25/2017 CLINICAL DATA:  62 year old female with dizziness, syncope and fall yesterday. EXAM: CT HEAD WITHOUT CONTRAST TECHNIQUE: Contiguous axial images were obtained from the base of the skull through the vertex without intravenous contrast. COMPARISON:  07/24/2016 MR FINDINGS: Brain: No evidence of acute infarction, hemorrhage, hydrocephalus, extra-axial  collection or mass lesion/mass effect. Mild atrophy noted. Vascular: Mild atherosclerotic calcifications noted. Skull: Normal. Negative for fracture or focal lesion. Sinuses/Orbits: No acute finding. Other: None. IMPRESSION: No  evidence of acute intracranial abnormality Mild atrophy Electronically Signed   By: Margarette Canada M.D.   On: 07/25/2017 11:04   Dg Knee Complete 4 Views Left  Result Date: 07/25/2017 CLINICAL DATA:  Left knee pain after fall EXAM: LEFT KNEE - COMPLETE 4+ VIEW COMPARISON:  None. FINDINGS: No evidence of fracture, dislocation, or joint effusion. No evidence of arthropathy or other focal bone abnormality. Soft tissues are unremarkable. IMPRESSION: No left knee fracture, dislocation or joint effusion. Electronically Signed   By: Ilona Sorrel M.D.   On: 07/25/2017 11:52   Dg Hip Unilat W Or Wo Pelvis 2-3 Views Left  Result Date: 07/25/2017 CLINICAL DATA:  Acute left hip pain following fall yesterday. Initial encounter. EXAM: DG HIP (WITH OR WITHOUT PELVIS) 2-3V LEFT COMPARISON:  None. FINDINGS: There is no evidence of hip fracture or dislocation. There is no evidence of arthropathy or other focal bone abnormality. IMPRESSION: Negative. Electronically Signed   By: Margarette Canada M.D.   On: 07/25/2017 11:51    ____________________________________________   PROCEDURES  Procedure(s) performed: None  Procedures  Critical Care performed: No ____________________________________________   INITIAL IMPRESSION / ASSESSMENT AND PLAN / ED COURSE  Pertinent labs & imaging results that were available during my care of the patient were reviewed by me and considered in my medical decision making (see chart for details).  62 y.o. female with a history of recurrent dizziness and chronic pain presenting with a presyncopal episode, associated with left facial and left leg numbness and fall with resulting traumatic injury yesterday.  Overall, the patient is hemodynamically stable.  On my  examination she does have some focal neurologic deficits on her sensory examination.  I am concerned about possible stroke and she will undergo CT scan; if that is negative she will undergo MRI.  ----------------------------------------- 11:47 AM on 07/25/2017 -----------------------------------------  Patient CT scan did not show any acute stroke.  An MRI has been ordered and Dr. Doy Mince has been contacted for consultation for stroke examination.  The patient continues to be hemodynamically stable and will be admitted to the hospitalist at this time.  ____________________________________________  FINAL CLINICAL IMPRESSION(S) / ED DIAGNOSES  Final diagnoses:  Numbness and tingling of left side of face  Numbness in left leg  Postural dizziness with presyncope  Fall (on) (from) other stairs and steps, initial encounter         NEW MEDICATIONS STARTED DURING THIS VISIT:  Current Discharge Medication List       Eula Listen, MD 07/25/17 1317

## 2017-07-25 NOTE — Evaluation (Signed)
Physical Therapy Evaluation Patient Details Name: Sarah [name] MRN: [id number] DOB: 10-20-1955 Today's Date: 07/25/2017   History of Present Illness  62 y.o. female with a known history of CKD stage 3, mild nonrheumatic aortic valve stenosis, chronic diastolic heart failure, multiple falls, lumbar ablation 11/18.  She had a syncopal episode with fall 1/24 with L LE numbness, ankle pain/bruising.  Clinical Impression  Pt here after fall with possible TIA, imaging negative, L ankle imaging reveals no fracture (ankle is bruised med/lat).  Pt was able to move in the bed well, but c/o pain in L ankle with any movement and did not tolerate strength testing at the ankle.  She was able to walk ~100 ft with FWW (normally uses a cane) and will need a walker at discharge to allow safe distribution of weight on L during functional tasks/walking.  Pt reports she is open to the idea of HHPT, but if she is doing well enough does not necessarily want it.      Follow Up Recommendations Home health PT    Equipment Recommendations  Rolling walker with 5" wheels    Recommendations for Other Services       Precautions / Restrictions Precautions Precautions: Fall Restrictions Weight Bearing Restrictions: No      Mobility  Bed Mobility Overal bed mobility: Independent             General bed mobility comments: Pt able to easily get herself to EOB w/o heavy rail use  Transfers Overall transfer level: Independent Equipment used: Rolling walker (2 wheeled)             General transfer comment: Pt initially very hesitant to put any weight on the L LE, able to rise w/o assist  Ambulation/Gait Ambulation/Gait assistance: Modified independent (Device/Increase time) Ambulation Distance (Feet): 100 Feet Assistive device: Rolling walker (2 wheeled)       General Gait Details: Pt is able to slowly, but safely, ambulate with heavy reliance on the walker.  With increased time standing she was  able to more comfortably put weight through L LE and even kept the walker moving during stance phase on mutiple occasions  Stairs            Wheelchair Mobility    Modified Rankin (Stroke Patients Only)       Balance Overall balance assessment: Modified Independent                                           Pertinent Vitals/Pain Pain Assessment: 0-10 Pain Score: 7  Pain Location: L ankle    Home Living Family/patient expects to be discharged to:: Private residence Living Arrangements: Other relatives(sister) Available Help at Discharge: Family   Home Access: Stairs to enter Entrance Stairs-Rails: None Entrance Stairs-Number of Steps: 5 Home Layout: (1 step inside to get to main floor) Home Equipment: Cane - single point      Prior Function Level of Independence: Independent with assistive device(s)         Comments: Pt reports she does not use the cane most of the time in the home     Hand Dominance   Dominant Hand: Right    Extremity/Trunk Assessment   Upper Extremity Assessment Upper Extremity Assessment: Overall WFL for tasks assessed(L elbow weaker than R, still WFL, pt reports near baseline)    Lower Extremity Assessment Lower Extremity Assessment: Overall  WFL for tasks assessed(L ankle/lower leg pain limited, appears pure strength is [facility])       Communication   Communication: No difficulties  Cognition Arousal/Alertness: Awake/alert Behavior During Therapy: WFL for tasks assessed/performed Overall Cognitive Status: Within Functional Limits for tasks assessed                                        General Comments      Exercises     Assessment/Plan    PT Assessment Patient needs continued PT services  PT Problem List Decreased strength;Decreased range of motion;Decreased activity tolerance;Decreased balance;Decreased mobility;Decreased coordination;Decreased knowledge of use of DME;Decreased safety  awareness;Decreased knowledge of precautions;Pain       PT Treatment Interventions DME instruction;Gait training;Stair training;Functional mobility training;Therapeutic activities;Therapeutic exercise;Balance training;Neuromuscular re-education;Patient/family education    PT Goals (Current goals can be found in the Care Plan section)  Acute Rehab PT Goals Patient Stated Goal: go home PT Goal Formulation: With patient Time For Goal Achievement: 08/08/17 Potential to Achieve Goals: Good    Frequency Min 2X/week   Barriers to discharge        Co-evaluation               AM-PAC PT "6 Clicks" Daily Activity  Outcome Measure Difficulty turning over in bed (including adjusting bedclothes, sheets and blankets)?: None Difficulty moving from lying on back to sitting on the side of the bed? : None Difficulty sitting down on and standing up from a chair with arms (e.g., wheelchair, bedside commode, etc,.)?: None Help needed moving to and from a bed to chair (including a wheelchair)?: None Help needed walking in hospital room?: A Little Help needed climbing 3-5 steps with a railing? : A Lot 6 Click Score: 21    End of Session Equipment Utilized During Treatment: Gait belt Activity Tolerance: Patient tolerated treatment well;Patient limited by pain Patient left: with chair alarm set;with call bell/phone within reach Nurse Communication: Mobility status(ice for L ankle?) PT Visit Diagnosis: Muscle weakness (generalized) (M62.81);Difficulty in walking, not elsewhere classified (R26.2);Pain Pain - Right/Left: Left Pain - part of body: Ankle and joints of foot    Time: 3149-7026 PT Time Calculation (min) (ACUTE ONLY): 25 min   Charges:   PT Evaluation $PT Eval Low Complexity: 1 Low     PT G Codes:        Kreg Shropshire, DPT 07/25/2017, 5:15 PM

## 2017-07-25 NOTE — Progress Notes (Signed)
Talked to Dr. Benjie Karvonen about patient's complaints of nausea, is allergic to Zofran, order given for phenergan 25 mg p.o bid which is her home dose. Also asked if patient can have other pain medication other than the fentanyl patch, patient is allergic to oxycodone, morphine, acetaminophen order for Dilaudid 0.5 mg every 6 hours. No other concern at the moment. RN will continue to monitor.

## 2017-07-25 NOTE — ED Triage Notes (Signed)
Pt arrived via POV- drove herself. Pt states she had a fall yesterday.  Pt states she was dizzy and had syncopal episode. C/o left side rib pain, left neck pain, and left ankle pain.  Pt also had a fall in December from leg giving out.  Pt states she has hx of dizzy spells since the 1990s states he has been through extensive therapy in the past.   Pt states left side facial numbness during the night and left foot numbness since last night.

## 2017-07-25 NOTE — Progress Notes (Signed)
PHARMACIST - PHYSICIAN ORDER COMMUNICATION  CONCERNING: P&T Medication Policy on Herbal Medications  DESCRIPTION:  This patient's orders for:  Biotin and Hair/Skin/Nails  have been noted.  These products are classified as "herbal" or natural products. Due to a lack of definitive safety studies or FDA approval, nonstandard manufacturing practices, plus the potential risk of unknown drug-drug interactions while on inpatient medications, the Pharmacy and Therapeutics Committee does not permit the use of "herbal" or natural products of this type within [facility].   ACTION TAKEN: The pharmacy department is unable to verify this order at this time.  Please reevaluate patient's clinical condition at discharge and address if the herbal or natural product(s) should be resumed at that time.

## 2017-07-25 NOTE — Consult Note (Signed)
Referring Physician: Mody  Chief Complaint: Paresthesias  HPI: Sarah [name] is an 62 y.o. female with a history of vertigo who reports that yesterday she had the onset of a severe episode of vertigo that was preceded by tinnitus bilaterally.  Patient reports that her vision became blurry as well.  She was attempting to come down a stair and fell, bruising.  When she went to bed that evening she noted paresthesias, particularly on the left.  Initial NIHSS of 1.    Date last known well: Date: 07/25/2017 Time last known well: Time: 01:00 tPA Given: No: Outside time window  Past Medical History:  Diagnosis Date  . Anemia   . Anxiety   . Arthritis   . Bicuspid aortic valve   . CHF (congestive heart failure) ([place])   . CKD (chronic kidney disease) stage 3, GFR 30-59 ml/min ([facility])   . Depression   . Diabetes mellitus ([place])   . Dizziness   . Fatty liver   . Frequent falls   . GERD (gastroesophageal reflux disease)   . Gout   . Heart murmur   . History of blood transfusion   . History of bone marrow transplant ([place])   . History of uterine fibroid   . Hypertension   . Hypomagnesemia   . Multiple myeloma ([place])   . Personal history of chemotherapy   . Renal cyst     Past Surgical History:  Procedure Laterality Date  . ABDOMINAL HYSTERECTOMY    . 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 CARDIAC ELECTROPHYSIOLOGY MAPPING AND ABLATION    . CARPAL TUNNEL RELEASE Bilateral   . CHOLECYSTECTOMY  2008  . COLONOSCOPY WITH PROPOFOL N/A 05/07/2017   Procedure: COLONOSCOPY WITH PROPOFOL;  Surgeon: [name], MD;  Location: [facility] ENDOSCOPY;  Service: Gastroenterology;  Laterality: N/A;  . ESOPHAGOGASTRODUODENOSCOPY (EGD) WITH PROPOFOL N/A 05/07/2017   Procedure: ESOPHAGOGASTRODUODENOSCOPY (EGD) WITH PROPOFOL;  Surgeon: [name], MD;  Location: [facility] ENDOSCOPY;  Service: Gastroenterology;  Laterality: N/A;  . FOOT SURGERY Bilateral   . INCONTINENCE SURGERY  2009  . PARTIAL HYSTERECTOMY   03/1996   fibroids  . TONSILLECTOMY  2007    Family History  Problem Relation Age of Onset  . Colon cancer Father   . Renal Disease Father   . Diabetes Mellitus II Father   . Melanoma Paternal Grandmother   . Breast cancer Maternal Aunt 43  . Anemia Mother   . Heart disease Mother   . Heart failure Mother   . Renal Disease Mother   . Congestive Heart Failure Mother   . Heart disease Maternal Uncle   . Throat cancer Maternal Uncle   . Lung cancer Maternal Uncle   . Liver disease Maternal Uncle   . Heart failure Maternal Uncle   . Hearing loss Son 44       Suicide    Social History:  reports that she quit smoking about 26 years ago. Her smoking use included cigarettes. She has a 20.00 pack-year smoking history. she has never used smokeless tobacco. She reports that she drinks about 1.2 oz of alcohol per week. She reports that she does not use drugs.  Allergies:  Allergies  Allergen Reactions  . Oxycodone-Acetaminophen Anaphylaxis    Swelling and rash  . Benadryl [Diphenhydramine] Palpitations  . Morphine Itching and Rash  . Ondansetron Diarrhea    _0   . Tylenol [Acetaminophen] Itching and Rash  Medications:  I have reviewed the patient's current medications. Prior to Admission:  Medications Prior to Admission  Medication Sig Dispense Refill Last Dose  . acyclovir (ZOVIRAX) 200 MG capsule TAKE 2 CAPSULES(400 MG) BY MOUTH THREE TIMES DAILY 180 capsule 1 07/24/2017 at 1800  . allopurinol (ZYLOPRIM) 100 MG tablet TAKE 1 TABLET(100 MG) BY MOUTH DAILY (Patient taking differently: TAKE 1 TABLET(100 MG) BY MOUTH DAILY as needed) 30 tablet 5 prn at prn  . Biotin 1 MG CAPS Take 1 mg by mouth daily.    07/24/2017 at 0800  . bisoprolol (ZEBETA) 10 MG tablet TAKE 1 TABLET(10 MG) BY MOUTH DAILY 30 tablet 5 07/25/2017 at 0800  . cholestyramine (QUESTRAN) 4 g packet  Take 4 g once daily. (Patient taking differently: Take 4 g by mouth daily as needed. Take 4 g once daily.) 60 each 3 prn at prn  . diphenoxylate-atropine (LOMOTIL) 2.5-0.025 MG tablet Take 2 tablets by mouth daily as needed for diarrhea or loose stools. 30 tablet 2 prn at prn  . DULoxetine (CYMBALTA) 60 MG capsule TAKE 1 CAPSULE(60 MG) BY MOUTH TWICE DAILY 60 capsule 5 07/25/2017 at 0800  . fentaNYL (DURAGESIC - DOSED MCG/HR) 25 MCG/HR patch Place 25 mcg onto the skin every 3 (three) days.   07/24/2017 at 2000  . fexofenadine (ALLEGRA) 180 MG tablet TAKE 1 TABLET(180 MG) BY MOUTH DAILY (Patient taking differently: TAKE 1 TABLET(180 MG) BY MOUTH DAILY as needed) 30 tablet 5 prn at prn  . fluticasone (FLONASE) 50 MCG/ACT nasal spray Place 2 sprays into both nostrils daily. (Patient taking differently: Place 2 sprays into both nostrils daily as needed. ) 16 g 0 prn at prn  . furosemide (LASIX) 20 MG tablet Take 0.5 tablets (10 mg total) by mouth daily as needed. 30 tablet 0 prn at prn  . hydrOXYzine (ATARAX/VISTARIL) 10 MG tablet TAKE 1 TABLET(10 MG) BY MOUTH THREE TIMES DAILY AS NEEDED 90 tablet 0 prn at prn  . montelukast (SINGULAIR) 10 MG tablet Take 1 tablet (10 mg total) by mouth at bedtime. (Patient taking differently: Take 10 mg by mouth at bedtime as needed. ) 30 tablet 5 prn at prn  . Multiple Vitamins-Minerals (HAIR/SKIN/NAILS/BIOTIN PO) Take 1 capsule by mouth 3 (three) times daily.   07/24/2017 at 0800  . omeprazole (PRILOSEC) 40 MG capsule Take 1 capsule (40 mg total) by mouth 2 (two) times daily. (Patient taking differently: Take 40 mg by mouth daily. ) 60 capsule 5 07/25/2017 at 0800  . promethazine (PHENERGAN) 25 MG tablet TAKE 1 TO 2 TABLETS BY MOUTH TWICE DAILY BEFORE MEALS AS NEEDED 60 tablet 0 prn at prn  . tiZANidine (ZANAFLEX) 4 MG tablet TAKE 1 TABLET BY MOUTH EVERY 8 HOURS AS NEEDED, MUSCLE RELAXER FOR BACK PAIN 90 tablet 0 prn at prn  . traZODone (DESYREL) 50 MG tablet Take 50 mg by  mouth at bedtime as needed.    prn at prn  . zaleplon (SONATA) 10 MG capsule TAKE 1 CAPSULE BY MOUTH EVERY DAY AT BEDTIME AS NEEDED FOR SLEEP 30 capsule 5 jprn at prn  . glucose blood (ONE TOUCH ULTRA TEST) test strip    Taking   Scheduled: .  stroke: mapping our early stages of recovery book   Does not apply Once  . allopurinol  100 mg Oral Daily  . aspirin  81 mg Oral Daily  . [START ON 07/26/2017] bisoprolol  10 mg Oral Daily  . [START ON 07/26/2017] DULoxetine  60 mg  Oral Daily  . enoxaparin (LOVENOX) injection  40 mg Subcutaneous Q24H  . fentaNYL  25 mcg Transdermal Q3 days  . insulin aspart  0-9 Units Subcutaneous TID WC  . loratadine  10 mg Oral Daily  . [START ON 07/26/2017] pantoprazole  40 mg Oral Daily    ROS: History obtained from the patient  General ROS: negative for - chills, fatigue, fever, night sweats, weight gain or weight loss Psychological ROS: negative for - behavioral disorder, hallucinations, memory difficulties, mood swings or suicidal ideation Ophthalmic ROS: blurry vision ENT ROS: tinnitus Allergy and Immunology ROS: negative for - hives or itchy/watery eyes Hematological and Lymphatic ROS: bruising Endocrine ROS: negative for - galactorrhea, hair pattern changes, polydipsia/polyuria or temperature intolerance Respiratory ROS: negative for - cough, hemoptysis, shortness of breath or wheezing Cardiovascular ROS: negative for - chest pain, dyspnea on exertion, edema or irregular heartbeat Gastrointestinal ROS: negative for - abdominal pain, diarrhea, hematemesis, nausea/vomiting or stool incontinence Genito-Urinary ROS: negative for - dysuria, hematuria, incontinence or urinary frequency/urgency Musculoskeletal ROS: back pain Neurological ROS: as noted in HPI Dermatological ROS: negative for rash and skin lesion changes  Physical Examination: Blood pressure (!) 150/74, pulse 75, temperature 98.6 F (37 C), temperature source Oral, resp. rate 16, height _0   (1.575 m), weight 68.9 kg (151 lb 12.8 oz), SpO2 100 %.  HEENT-  Normocephalic, no lesions, without obvious abnormality.  Normal external eye and conjunctiva.  Normal TM's bilaterally.  Normal auditory canals and external ears. Normal external nose, mucus membranes and septum.  Normal pharynx. Cardiovascular- S1, S2 normal, pulses palpable throughout   Lungs- chest clear, no wheezing, rales, normal symmetric air entry Abdomen- soft, non-tender; bowel sounds normal; no masses,  no organomegaly Extremities- hematoma noted at left ankle Lymph-no adenopathy palpable Musculoskeletal-no joint tenderness, deformity or swelling Skin-warm and dry, no hyperpigmentation, vitiligo, or suspicious lesions  Neurological Examination   Mental Status: Alert, oriented, thought content appropriate.  Speech fluent without evidence of aphasia.  Able to follow 3 step commands without difficulty. Cranial Nerves: II: Discs flat bilaterally; Visual fields grossly normal, pupils equal, round, reactive to light and accommodation III,IV, VI: ptosis not present, extra-ocular motions intact bilaterally V,VII: smile symmetric, facial light touch sensation decreased on the left VIII: hearing normal bilaterally IX,X: gag reflex present XI: bilateral shoulder shrug XII: midline tongue extension Motor: Right : Upper extremity   5/5    Left:     Upper extremity   5/5  Lower extremity   5/5     Lower extremity   5/5 Tone and bulk:normal tone throughout; no atrophy noted Sensory: Pinprick and light touch decreased on the left hand, right forearm and left lower extremity Deep Tendon Reflexes: 2+ and symmetric with absent AJ's bilaterally Plantars: Right: downgoing   Left: downgoing Cerebellar: Normal finger-to-nose, normal rapid alternating movements and normal heel-to-shin testing billaterally Gait: not tested due to safety concerns    Laboratory Studies:  Basic Metabolic Panel: Recent Labs  Lab 07/25/17 0948  NA  137  K 4.3  CL 103  CO2 23  GLUCOSE 126*  BUN 30*  CREATININE 1.23*  CALCIUM 10.3    Liver Function Tests: Recent Labs  Lab 07/25/17 0948  AST 32  ALT 35  ALKPHOS 104  BILITOT 0.8  PROT 8.1  ALBUMIN 4.8   No results for input(s): LIPASE, AMYLASE in the last 168 hours. No results for input(s): AMMONIA in the last 168 hours.  CBC: Recent Labs  Lab 07/25/17 [phone]  WBC 9.6  HGB 13.4  HCT 38.6  MCV 88.9  PLT 144*    Cardiac Enzymes: Recent Labs  Lab 07/25/17 0948  TROPONINI <0.03    BNP: Invalid input(s): POCBNP  CBG: Recent Labs  Lab 07/25/17 0944 07/25/17 1001  GLUCAP 137* 123*    Microbiology: Results for orders placed or performed during the hospital encounter of 10/16/16  C difficile quick scan w PCR reflex     Status: None   Collection Time: 10/16/16  4:19 PM  Result Value Ref Range Status   C Diff antigen NEGATIVE NEGATIVE Final   C Diff toxin NEGATIVE NEGATIVE Final   C Diff interpretation No C. difficile detected.  Final    Coagulation Studies: No results for input(s): LABPROT, INR in the last 72 hours.  Urinalysis: No results for input(s): COLORURINE, LABSPEC, PHURINE, GLUCOSEU, HGBUR, BILIRUBINUR, KETONESUR, PROTEINUR, UROBILINOGEN, NITRITE, LEUKOCYTESUR in the last 168 hours.  Invalid input(s): APPERANCEUR  Lipid Panel:    Component Value Date/Time   CHOL 178 04/09/2017 0849   TRIG 79 04/09/2017 0849   HDL 96 04/09/2017 0849   CHOLHDL 1.9 04/09/2017 0849   LDLCALC 66 04/09/2017 0849    HgbA1C:  Lab Results  Component Value Date   HGBA1C 6.1 (H) 04/09/2017    Urine Drug Screen:  No results found for: LABOPIA, COCAINSCRNUR, LABBENZ, AMPHETMU, THCU, LABBARB  Alcohol Level: No results for input(s): ETH in the last 168 hours.  Other results: EKG: normal sinus rhythm at 76 bpm, LBBB.  Imaging: Dg Chest 2 View  Result Date: 07/25/2017 CLINICAL DATA:  Acute chest pain following fall yesterday. Initial encounter. EXAM: CHEST   2 VIEW COMPARISON:  05/21/2016 radiographs FINDINGS: The cardiomediastinal silhouette is unremarkable. There is no evidence of focal airspace disease, pulmonary edema, suspicious pulmonary nodule/mass, pleural effusion, or pneumothorax. No acute bony abnormalities are identified. IMPRESSION: No active cardiopulmonary disease. Electronically Signed   By: Margarette Canada M.D.   On: 07/25/2017 11:53   Dg Cervical Spine Complete  Result Date: 07/25/2017 CLINICAL DATA:  Acute neck pain following fall yesterday. Initial encounter. EXAM: CERVICAL SPINE - COMPLETE 4+ VIEW COMPARISON:  05/28/2017 FINDINGS: There is no evidence of acute fracture, subluxation or prevertebral soft tissue swelling. Mild degenerative disc disease and spondylosis in the mid-lower cervical spine noted. No focal bony lesions are noted. IMPRESSION: No acute abnormality. Electronically Signed   By: Margarette Canada M.D.   On: 07/25/2017 11:56   Dg Thoracic Spine 2 View  Result Date: 07/25/2017 CLINICAL DATA:  Acute mid back pain following fall yesterday. Initial encounter. EXAM: THORACIC SPINE 2 VIEWS COMPARISON:  05/28/2017 FINDINGS: No acute fracture or subluxation identified. Anterior and lateral spondylosis again noted. No suspicious focal bony lesions are present. IMPRESSION: No acute abnormalities. Electronically Signed   By: Margarette Canada M.D.   On: 07/25/2017 11:55   Dg Ankle Complete Left  Result Date: 07/25/2017 CLINICAL DATA:  Acute left ankle pain following fall yesterday. Initial encounter. EXAM: LEFT ANKLE COMPLETE - 3+ VIEW COMPARISON:  None. FINDINGS: No acute fracture, subluxation or dislocation. The joint spaces are unremarkable. A small calcaneal spur is present. IMPRESSION: No acute bony abnormality. Electronically Signed   By: Margarette Canada M.D.   On: 07/25/2017 11:52   Ct Head Wo Contrast  Result Date: 07/25/2017 CLINICAL DATA:  62 year old female with dizziness, syncope and fall yesterday. EXAM: CT HEAD WITHOUT CONTRAST  TECHNIQUE: Contiguous axial images were obtained from the base of the skull through the vertex without  intravenous contrast. COMPARISON:  07/24/2016 MR FINDINGS: Brain: No evidence of acute infarction, hemorrhage, hydrocephalus, extra-axial collection or mass lesion/mass effect. Mild atrophy noted. Vascular: Mild atherosclerotic calcifications noted. Skull: Normal. Negative for fracture or focal lesion. Sinuses/Orbits: No acute finding. Other: None. IMPRESSION: No evidence of acute intracranial abnormality Mild atrophy Electronically Signed   By: Margarette Canada M.D.   On: 07/25/2017 11:04   Dg Knee Complete 4 Views Left  Result Date: 07/25/2017 CLINICAL DATA:  Left knee pain after fall EXAM: LEFT KNEE - COMPLETE 4+ VIEW COMPARISON:  None. FINDINGS: No evidence of fracture, dislocation, or joint effusion. No evidence of arthropathy or other focal bone abnormality. Soft tissues are unremarkable. IMPRESSION: No left knee fracture, dislocation or joint effusion. Electronically Signed   By: Ilona Sorrel M.D.   On: 07/25/2017 11:52   Dg Hip Unilat W Or Wo Pelvis 2-3 Views Left  Result Date: 07/25/2017 CLINICAL DATA:  Acute left hip pain following fall yesterday. Initial encounter. EXAM: DG HIP (WITH OR WITHOUT PELVIS) 2-3V LEFT COMPARISON:  None. FINDINGS: There is no evidence of hip fracture or dislocation. There is no evidence of arthropathy or other focal bone abnormality. IMPRESSION: Negative. Electronically Signed   By: Margarette Canada M.D.   On: 07/25/2017 11:51    Assessment: 62 y.o. female presenting with paresthesias after a fall.  Symptoms not restricted to one side of the body.  Unclear etiology.   May be related to ischemic disease versus trauma.  Further work up recommended.    Stroke Risk Factors - diabetes mellitus and hypertension  Plan: 1. HgbA1c, fasting lipid panel 2. MRI, MRA  of the brain without contrast.  Stroke work up to be initiated if diagnostic of an acute infarct.    3. Prophylactic  therapy-Antiplatelet med: Aspirin - dose 314m daily 4. NPO until RN stroke swallow screen 5. Telemetry monitoring 6. Frequent neuro checks   LAlexis Goodell MD Neurology [id number]/25/2019, 1:16 PM

## 2017-07-25 NOTE — ED Notes (Signed)
RN unavailable to take report at this time; name and ascom number left to receive call back.

## 2017-07-26 ENCOUNTER — Observation Stay: Admit: 2017-07-26 | Payer: BLUE CROSS/BLUE SHIELD

## 2017-07-26 DIAGNOSIS — R200 Anesthesia of skin: Secondary | ICD-10-CM | POA: Diagnosis not present

## 2017-07-26 DIAGNOSIS — R202 Paresthesia of skin: Secondary | ICD-10-CM | POA: Diagnosis not present

## 2017-07-26 LAB — URINALYSIS, COMPLETE (UACMP) WITH MICROSCOPIC
Bacteria, UA: NONE SEEN
Bilirubin Urine: NEGATIVE
Glucose, UA: NEGATIVE mg/dL
Hgb urine dipstick: NEGATIVE
KETONES UR: NEGATIVE mg/dL
Nitrite: NEGATIVE
PH: 5 (ref 5.0–8.0)
Protein, ur: NEGATIVE mg/dL
SPECIFIC GRAVITY, URINE: 1.013 (ref 1.005–1.030)

## 2017-07-26 LAB — BASIC METABOLIC PANEL
Anion gap: 8 (ref 5–15)
BUN: 27 mg/dL — ABNORMAL HIGH (ref 6–20)
CALCIUM: 9.4 mg/dL (ref 8.9–10.3)
CO2: 25 mmol/L (ref 22–32)
Chloride: 104 mmol/L (ref 101–111)
Creatinine, Ser: 1.02 mg/dL — ABNORMAL HIGH (ref 0.44–1.00)
GFR calc Af Amer: >60 mL/min (ref >60)
GFR calc non Af Amer: 59 mL/min — ABNORMAL LOW (ref >60)
Glucose, Bld: 134 mg/dL — ABNORMAL HIGH (ref 65–99)
Potassium: 4.5 mmol/L (ref 3.5–5.1)
Sodium: 137 mmol/L (ref 135–145)

## 2017-07-26 LAB — CBC
HCT: 37.2 % (ref 35.0–47.0)
Hemoglobin: 12.8 g/dL (ref 12.0–16.0)
MCH: 30.7 pg (ref 26.0–34.0)
MCHC: 34.4 g/dL (ref 32.0–36.0)
MCV: 89.1 fL (ref 80.0–100.0)
Platelets: 128 10*3/uL — ABNORMAL LOW (ref 150–440)
RBC: 4.18 MIL/uL (ref 3.80–5.20)
RDW: 15.1 % — ABNORMAL HIGH (ref 11.5–14.5)
WBC: 5.6 10*3/uL (ref 3.6–11.0)

## 2017-07-26 LAB — LIPID PANEL
Cholesterol: 176 mg/dL (ref 0–200)
HDL: 81 mg/dL (ref >40)
LDL CALC: 74 mg/dL (ref 0–99)
Total CHOL/HDL Ratio: 2.2 RATIO
Triglycerides: 104 mg/dL (ref <150)
VLDL: 21 mg/dL (ref 0–40)

## 2017-07-26 LAB — GLUCOSE, CAPILLARY
Glucose-Capillary: 119 mg/dL — ABNORMAL HIGH (ref 65–99)
Glucose-Capillary: 172 mg/dL — ABNORMAL HIGH (ref 65–99)

## 2017-07-26 LAB — HEMOGLOBIN A1C
HEMOGLOBIN A1C: 6.4 % — AB (ref 4.8–5.6)
MEAN PLASMA GLUCOSE: 136.98 mg/dL

## 2017-07-26 MED ORDER — ASPIRIN 81 MG PO CHEW: 81.0000 mg | CHEWABLE_TABLET | Freq: Every day | ORAL | 0 refills | Status: DC

## 2017-07-26 MED ORDER — ALLOPURINOL 100 MG PO TABS: ORAL_TABLET | ORAL | 5 refills | Status: DC

## 2017-07-26 MED ORDER — SODIUM CHLORIDE 0.9% FLUSH
3.0000 mL | Freq: Two times a day (BID) | INTRAVENOUS | Status: DC
  Administered 2017-07-26: 3 mL via INTRAVENOUS

## 2017-07-26 MED ORDER — DULOXETINE HCL 60 MG PO CPEP: 60.0000 mg | ORAL_CAPSULE | Freq: Every day | ORAL | 1 refills | Status: DC

## 2017-07-26 NOTE — Plan of Care (Signed)
Review of meds, follow up, and symptoms to watch for.  Able to care for herself at home.

## 2017-07-26 NOTE — Consult Note (Signed)
Vertigo resolved. Occasional parasthesias in hands and feet.   Past Medical History:  Diagnosis Date  . Anemia   . Anxiety   . Arthritis   . Bicuspid aortic valve   . CHF (congestive heart failure) ([place])   . CKD (chronic kidney disease) stage 3, GFR 30-59 ml/min ([facility])   . Depression   . Diabetes mellitus ([place])   . Dizziness   . Fatty liver   . Frequent falls   . GERD (gastroesophageal reflux disease)   . Gout   . Heart murmur   . History of blood transfusion   . History of bone marrow transplant ([place])   . History of uterine fibroid   . Hypertension   . Hypomagnesemia   . Multiple myeloma ([place])   . Personal history of chemotherapy   . Renal cyst     Past Surgical History:  Procedure Laterality Date  . ABDOMINAL HYSTERECTOMY    . 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 CARDIAC ELECTROPHYSIOLOGY MAPPING AND ABLATION    . CARPAL TUNNEL RELEASE Bilateral   . CHOLECYSTECTOMY  2008  . COLONOSCOPY WITH PROPOFOL N/A 05/07/2017   Procedure: COLONOSCOPY WITH PROPOFOL;  Surgeon: [name], MD;  Location: [facility] ENDOSCOPY;  Service: Gastroenterology;  Laterality: N/A;  . ESOPHAGOGASTRODUODENOSCOPY (EGD) WITH PROPOFOL N/A 05/07/2017   Procedure: ESOPHAGOGASTRODUODENOSCOPY (EGD) WITH PROPOFOL;  Surgeon: [name], MD;  Location: [facility] ENDOSCOPY;  Service: Gastroenterology;  Laterality: N/A;  . FOOT SURGERY Bilateral   . INCONTINENCE SURGERY  2009  . PARTIAL HYSTERECTOMY  03/1996   fibroids  . TONSILLECTOMY  2007    Family History  Problem Relation Age of Onset  . Colon cancer Father   . Renal Disease Father   . Diabetes Mellitus II Father   . Melanoma Paternal Grandmother   . Breast cancer Maternal Aunt 41  . Anemia Mother   . Heart disease Mother   . Heart failure Mother   . Renal Disease Mother   . Congestive Heart Failure Mother   . Heart disease Maternal Uncle   . Throat cancer Maternal Uncle   . Lung cancer Maternal Uncle   . Liver disease Maternal Uncle   . Heart  failure Maternal Uncle   . Hearing loss Son 80       Suicide    Social History:  reports that she quit smoking about 26 years ago. Her smoking use included cigarettes. She has a 20.00 pack-year smoking history. she has never used smokeless tobacco. She reports that she drinks about 1.2 oz of alcohol per week. She reports that she does not use drugs.  Allergies:  Allergies  Allergen Reactions  . Oxycodone-Acetaminophen Anaphylaxis    Swelling and rash  . Benadryl [Diphenhydramine] Palpitations  . Morphine Itching and Rash  . Ondansetron Diarrhea    CAUSES PATIENT DIARRHEA CAUSES PATIENT DIARRHEA CAUSES PATIENT DIARRHEA CAUSES PATIENT DIARRHEA CAUSES PATIENT DIARRHEA  . Tylenol [Acetaminophen] Itching and Rash    Medications:  I have reviewed the patient's current medications. Prior to Admission:  Medications Prior to Admission  Medication Sig Dispense Refill Last Dose  . acyclovir (ZOVIRAX) 200 MG capsule TAKE 2 CAPSULES(400 MG) BY MOUTH THREE TIMES DAILY 180 capsule 1 07/24/2017 at 1800  . Biotin 1 MG CAPS Take 1 mg by mouth daily.    07/24/2017 at 0800  . bisoprolol (ZEBETA) 10 MG tablet TAKE 1 TABLET(10 MG) BY MOUTH DAILY 30 tablet 5 07/25/2017 at 0800  . cholestyramine (QUESTRAN) 4 g packet Take  4 g once daily. (Patient taking differently: Take 4 g by mouth daily as needed. Take 4 g once daily.) 60 each 3 prn at prn  . diphenoxylate-atropine (LOMOTIL) 2.5-0.025 MG tablet Take 2 tablets by mouth daily as needed for diarrhea or loose stools. 30 tablet 2 prn at prn  . fentaNYL (DURAGESIC - DOSED MCG/HR) 25 MCG/HR patch Place 25 mcg onto the skin every 3 (three) days.   07/24/2017 at 2000  . fexofenadine (ALLEGRA) 180 MG tablet TAKE 1 TABLET(180 MG) BY MOUTH DAILY (Patient taking differently: TAKE 1 TABLET(180 MG) BY MOUTH DAILY as needed) 30 tablet 5 prn at prn  . fluticasone (FLONASE) 50 MCG/ACT nasal spray Place 2 sprays into both nostrils daily. (Patient taking differently: Place 2  sprays into both nostrils daily as needed. ) 16 g 0 prn at prn  . furosemide (LASIX) 20 MG tablet Take 0.5 tablets (10 mg total) by mouth daily as needed. 30 tablet 0 prn at prn  . hydrOXYzine (ATARAX/VISTARIL) 10 MG tablet TAKE 1 TABLET(10 MG) BY MOUTH THREE TIMES DAILY AS NEEDED 90 tablet 0 prn at prn  . montelukast (SINGULAIR) 10 MG tablet Take 1 tablet (10 mg total) by mouth at bedtime. (Patient taking differently: Take 10 mg by mouth at bedtime as needed. ) 30 tablet 5 prn at prn  . Multiple Vitamins-Minerals (HAIR/SKIN/NAILS/BIOTIN PO) Take 1 capsule by mouth 3 (three) times daily.   07/24/2017 at 0800  . omeprazole (PRILOSEC) 40 MG capsule Take 1 capsule (40 mg total) by mouth 2 (two) times daily. (Patient taking differently: Take 40 mg by mouth daily. ) 60 capsule 5 07/25/2017 at 0800  . promethazine (PHENERGAN) 25 MG tablet TAKE 1 TO 2 TABLETS BY MOUTH TWICE DAILY BEFORE MEALS AS NEEDED 60 tablet 0 prn at prn  . tiZANidine (ZANAFLEX) 4 MG tablet TAKE 1 TABLET BY MOUTH EVERY 8 HOURS AS NEEDED, MUSCLE RELAXER FOR BACK PAIN 90 tablet 0 prn at prn  . traZODone (DESYREL) 50 MG tablet Take 50 mg by mouth at bedtime as needed.    prn at prn  . zaleplon (SONATA) 10 MG capsule TAKE 1 CAPSULE BY MOUTH EVERY DAY AT BEDTIME AS NEEDED FOR SLEEP 30 capsule 5 jprn at prn  . glucose blood (ONE TOUCH ULTRA TEST) test strip    Taking   Scheduled: . allopurinol  100 mg Oral Daily  . aspirin  81 mg Oral Daily  . bisoprolol  10 mg Oral Daily  . DULoxetine  60 mg Oral Daily  . enoxaparin (LOVENOX) injection  40 mg Subcutaneous Q24H  . fentaNYL  25 mcg Transdermal Q3 days  . insulin aspart  0-9 Units Subcutaneous TID WC  . loratadine  10 mg Oral Daily  . pantoprazole  40 mg Oral Daily  . sodium chloride flush  3 mL Intravenous Q12H    ROS: History obtained from the patient  General ROS: negative for - chills, fatigue, fever, night sweats, weight gain or weight loss Psychological ROS: negative for -  behavioral disorder, hallucinations, memory difficulties, mood swings or suicidal ideation Ophthalmic ROS: blurry vision ENT ROS: tinnitus Allergy and Immunology ROS: negative for - hives or itchy/watery eyes Hematological and Lymphatic ROS: bruising Endocrine ROS: negative for - galactorrhea, hair pattern changes, polydipsia/polyuria or temperature intolerance Respiratory ROS: negative for - cough, hemoptysis, shortness of breath or wheezing Cardiovascular ROS: negative for - chest pain, dyspnea on exertion, edema or irregular heartbeat Gastrointestinal ROS: negative for - abdominal pain, diarrhea, hematemesis,  nausea/vomiting or stool incontinence Genito-Urinary ROS: negative for - dysuria, hematuria, incontinence or urinary frequency/urgency Musculoskeletal ROS: back pain Neurological ROS: as noted in HPI Dermatological ROS: negative for rash and skin lesion changes  Physical Examination: Blood pressure 114/83, pulse 78, temperature 98.6 F (37 C), temperature source Oral, resp. rate 20, height 5' 2"  (1.575 m), weight 151 lb 12.8 oz (68.9 kg), SpO2 97 %.  HEENT-  Normocephalic, no lesions, without obvious abnormality.  Normal external eye and conjunctiva.  Normal TM's bilaterally.  Normal auditory canals and external ears. Normal external nose, mucus membranes and septum.  Normal pharynx. Cardiovascular- S1, S2 normal, pulses palpable throughout   Lungs- chest clear, no wheezing, rales, normal symmetric air entry Abdomen- soft, non-tender; bowel sounds normal; no masses,  no organomegaly Extremities- hematoma noted at left ankle Lymph-no adenopathy palpable Musculoskeletal-no joint tenderness, deformity or swelling Skin-warm and dry, no hyperpigmentation, vitiligo, or suspicious lesions  Neurological Examination   Mental Status: Alert, oriented, thought content appropriate.  Speech fluent without evidence of aphasia.  Able to follow 3 step commands without difficulty. Cranial  Nerves: II: Discs flat bilaterally; Visual fields grossly normal, pupils equal, round, reactive to light and accommodation III,IV, VI: ptosis not present, extra-ocular motions intact bilaterally V,VII: smile symmetric, facial light touch sensation decreased on the left VIII: hearing normal bilaterally IX,X: gag reflex present XI: bilateral shoulder shrug XII: midline tongue extension Motor: Right : Upper extremity   5/5    Left:     Upper extremity   5/5  Lower extremity   5/5     Lower extremity   5/5 Tone and bulk:normal tone throughout; no atrophy noted Sensory: Pinprick and light touch decreased on the left hand, right forearm and left lower extremity Deep Tendon Reflexes: 2+ and symmetric with absent AJ's bilaterally Plantars: Right: downgoing   Left: downgoing Cerebellar: Normal finger-to-nose, normal rapid alternating movements and normal heel-to-shin testing billaterally Gait: not tested due to safety concerns    Laboratory Studies:  Basic Metabolic Panel: Recent Labs  Lab 07/25/17 0948 07/26/17 0446  NA 137 137  K 4.3 4.5  CL 103 104  CO2 23 25  GLUCOSE 126* 134*  BUN 30* 27*  CREATININE 1.23* 1.02*  CALCIUM 10.3 9.4    Liver Function Tests: Recent Labs  Lab 07/25/17 0948  AST 32  ALT 35  ALKPHOS 104  BILITOT 0.8  PROT 8.1  ALBUMIN 4.8   No results for input(s): LIPASE, AMYLASE in the last 168 hours. No results for input(s): AMMONIA in the last 168 hours.  CBC: Recent Labs  Lab 07/25/17 0948 07/26/17 0446  WBC 9.6 5.6  HGB 13.4 12.8  HCT 38.6 37.2  MCV 88.9 89.1  PLT 144* 128*    Cardiac Enzymes: Recent Labs  Lab 07/25/17 0948  TROPONINI <0.03    BNP: Invalid input(s): POCBNP  CBG: Recent Labs  Lab 07/25/17 1545 07/25/17 1657 07/25/17 2149 07/26/17 0813 07/26/17 1143  GLUCAP 102* 108* 143* 119* 172*    Microbiology: Results for orders placed or performed during the hospital encounter of 10/16/16  C difficile quick scan w  PCR reflex     Status: None   Collection Time: 10/16/16  4:19 PM  Result Value Ref Range Status   C Diff antigen NEGATIVE NEGATIVE Final   C Diff toxin NEGATIVE NEGATIVE Final   C Diff interpretation No C. difficile detected.  Final    Coagulation Studies: No results for input(s): LABPROT, INR in the last 72  hours.  Urinalysis:  Recent Labs  Lab 07/26/17 0031  COLORURINE YELLOW*  LABSPEC 1.013  PHURINE 5.0  GLUCOSEU NEGATIVE  HGBUR NEGATIVE  BILIRUBINUR NEGATIVE  KETONESUR NEGATIVE  PROTEINUR NEGATIVE  NITRITE NEGATIVE  LEUKOCYTESUR TRACE*    Lipid Panel:    Component Value Date/Time   CHOL 176 07/25/2017 0948   CHOL 178 04/09/2017 0849   TRIG 104 07/25/2017 0948   HDL 81 07/25/2017 0948   HDL 96 04/09/2017 0849   CHOLHDL 2.2 07/25/2017 0948   VLDL 21 07/25/2017 0948   LDLCALC 74 07/25/2017 0948   LDLCALC 66 04/09/2017 0849    HgbA1C:  Lab Results  Component Value Date   HGBA1C 6.4 (H) 07/25/2017    Urine Drug Screen:  No results found for: LABOPIA, COCAINSCRNUR, LABBENZ, AMPHETMU, THCU, LABBARB  Alcohol Level: No results for input(s): ETH in the last 168 hours.  Other results: EKG: normal sinus rhythm at 76 bpm, LBBB.  Imaging: Dg Chest 2 View  Result Date: 07/25/2017 CLINICAL DATA:  Acute chest pain following fall yesterday. Initial encounter. EXAM: CHEST  2 VIEW COMPARISON:  05/21/2016 radiographs FINDINGS: The cardiomediastinal silhouette is unremarkable. There is no evidence of focal airspace disease, pulmonary edema, suspicious pulmonary nodule/mass, pleural effusion, or pneumothorax. No acute bony abnormalities are identified. IMPRESSION: No active cardiopulmonary disease. Electronically Signed   By: Margarette Canada M.D.   On: 07/25/2017 11:53   Dg Cervical Spine Complete  Result Date: 07/25/2017 CLINICAL DATA:  Acute neck pain following fall yesterday. Initial encounter. EXAM: CERVICAL SPINE - COMPLETE 4+ VIEW COMPARISON:  05/28/2017 FINDINGS: There is  no evidence of acute fracture, subluxation or prevertebral soft tissue swelling. Mild degenerative disc disease and spondylosis in the mid-lower cervical spine noted. No focal bony lesions are noted. IMPRESSION: No acute abnormality. Electronically Signed   By: Margarette Canada M.D.   On: 07/25/2017 11:56   Dg Thoracic Spine 2 View  Result Date: 07/25/2017 CLINICAL DATA:  Acute mid back pain following fall yesterday. Initial encounter. EXAM: THORACIC SPINE 2 VIEWS COMPARISON:  05/28/2017 FINDINGS: No acute fracture or subluxation identified. Anterior and lateral spondylosis again noted. No suspicious focal bony lesions are present. IMPRESSION: No acute abnormalities. Electronically Signed   By: Margarette Canada M.D.   On: 07/25/2017 11:55   Dg Ankle Complete Left  Result Date: 07/25/2017 CLINICAL DATA:  Acute left ankle pain following fall yesterday. Initial encounter. EXAM: LEFT ANKLE COMPLETE - 3+ VIEW COMPARISON:  None. FINDINGS: No acute fracture, subluxation or dislocation. The joint spaces are unremarkable. A small calcaneal spur is present. IMPRESSION: No acute bony abnormality. Electronically Signed   By: Margarette Canada M.D.   On: 07/25/2017 11:52   Ct Head Wo Contrast  Result Date: 07/25/2017 CLINICAL DATA:  62 year old female with dizziness, syncope and fall yesterday. EXAM: CT HEAD WITHOUT CONTRAST TECHNIQUE: Contiguous axial images were obtained from the base of the skull through the vertex without intravenous contrast. COMPARISON:  07/24/2016 MR FINDINGS: Brain: No evidence of acute infarction, hemorrhage, hydrocephalus, extra-axial collection or mass lesion/mass effect. Mild atrophy noted. Vascular: Mild atherosclerotic calcifications noted. Skull: Normal. Negative for fracture or focal lesion. Sinuses/Orbits: No acute finding. Other: None. IMPRESSION: No evidence of acute intracranial abnormality Mild atrophy Electronically Signed   By: Margarette Canada M.D.   On: 07/25/2017 11:04   Mr Brain Wo  Contrast  Result Date: 07/25/2017 CLINICAL DATA:  Syncope. Fall. Low back and leg pain. Left leg numbness. Facial numbness. EXAM: MRI HEAD WITHOUT CONTRAST  MRA HEAD WITHOUT CONTRAST TECHNIQUE: Multiplanar, multiecho pulse sequences of the brain and surrounding structures were obtained without intravenous contrast. Angiographic images of the head were obtained using MRA technique without contrast. COMPARISON:  Head CT 07/25/2017 and MRI 07/24/2016 FINDINGS: MRI HEAD FINDINGS Brain: There is no evidence of acute infarct, intracranial hemorrhage, mass, midline shift, or extra-axial fluid collection. The ventricles and sulci are normal. Scattered punctate foci of cerebral white matter T2 hyperintensity are unchanged from the prior MRI and not greater than expected for patient's age. Vascular: Major intracranial vascular flow voids are preserved. Skull and upper cervical spine: Unremarkable bone marrow signal. Sinuses/Orbits: Unremarkable orbits. Small left mastoid effusion. Clear paranasal sinuses. Other: None. MRA HEAD FINDINGS The visualized distal vertebral arteries are widely patent to the basilar and codominant. Dominant right PICA and left AICA vessels are identified. SCAs are patent bilaterally. The basilar artery is widely patent. Posterior communicating arteries are not clearly identified and may be absent or diminutive. PCAs are patent without evidence of significant stenosis. The internal carotid arteries are patent from skull base to carotid termini. There is a moderate to severe stenosis of the distal left supraclinoid ICA, corresponding to prominent calcified plaque on CT. ACAs and MCAs are patent without evidence of proximal branch occlusion or significant stenosis. The anterior communicating artery is unremarkable. No aneurysm is identified. IMPRESSION: 1. No acute intracranial abnormality. Unremarkable MR appearance of the brain for age. 2. Patent Circle of Willis. 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 Electronically Signed   By: Logan Bores M.D.   On: 07/25/2017 15:04   Mr Lumbar Spine Wo Contrast  Result Date: 07/25/2017 CLINICAL DATA:  Generalized low back pain with leg swelling. Chronic kidney disease. History of diabetes and congestive heart failure. EXAM: MRI LUMBAR SPINE WITHOUT CONTRAST TECHNIQUE: Multiplanar, multisequence MR imaging of the lumbar spine was performed. No intravenous contrast was administered. COMPARISON:  Abdominopelvic CT 10/16/2016. FINDINGS: Segmentation: Conventional anatomy assumed, with the last open disc space designated L5-S1. Using this numbering, there are small ribs at T12 and L5 is transitional. Alignment:  Normal. Vertebrae: No worrisome osseous lesion, acute fracture or pars defect. The lumbar pedicles are diffusely short on a congenital basis. There is prominent facet hypertrophy at L4-5 with subchondral edema on the right, extending into the right L5 pedicle. The visualized sacroiliac joints appear unremarkable. Conus medullaris: Extends to the T12 level and appears normal. Paraspinal and other soft tissues: No significant paraspinal findings. Small renal cysts bilaterally. Disc levels: No significant disc space findings at T11-12 or T12-L1. L1-2: Mild disc bulging and facet hypertrophy. No spinal stenosis or nerve root encroachment. L2-3: Mild disc bulging with facet and ligamentous hypertrophy, contributing to mild spinal stenosis. The lateral recesses and foramina are sufficiently patent. L3-4: Mild disc bulging with facet and ligamentous hypertrophy, contributing to mild spinal stenosis. The foramina and lateral recesses are sufficiently patent. L4-5: Mild loss of disc height with annular disc bulging and a broad-based central disc protrusion, similar to previous CT. There is advanced facet and ligamentous hypertrophy with small posteriorly projecting synovial cysts. These findings contribute to moderate multifactorial spinal stenosis, moderate right  foraminal narrowing and mild narrowing the left foramen and lateral recesses L5-S1: As numbered, this disc space is somewhat transitional with bilateral lumbosacral assimilation joints. Disc height and hydration are maintained. No spinal stenosis or nerve root encroachment. IMPRESSION: 1. Slightly transitional lumbosacral anatomy. Transitional segment assigned L5. 2. Moderate multifactorial spinal stenosis at L4-5 with moderate asymmetric right foraminal  narrowing. Broad-based central disc protrusion appears chronic. Bilateral facet hypertrophy with subchondral edema and small posteriorly projecting synovial cysts. 3. Mild multifactorial spinal stenosis at L2-3 and L3-4. Electronically Signed   By: Richardean Sale M.D.   On: 07/25/2017 15:10   US Carotid Bilateral (at [facility] Only)  Result Date: 07/25/2017 CLINICAL DATA:  Stroke EXAM: BILATERAL CAROTID DUPLEX ULTRASOUND TECHNIQUE: Pearline Cables scale imaging, color Doppler and duplex ultrasound were performed of bilateral carotid and vertebral arteries in the neck. COMPARISON:  None. FINDINGS: Criteria: Quantification of carotid stenosis is based on velocity parameters that correlate the residual internal carotid diameter with NASCET-based stenosis levels, using the diameter of the distal internal carotid lumen as the denominator for stenosis measurement. The following velocity measurements were obtained: RIGHT ICA:  74 cm/sec CCA:  72 cm/sec SYSTOLIC ICA/CCA RATIO:  1.0 DIASTOLIC ICA/CCA RATIO:  1.3 ECA:  64 cm/sec LEFT ICA:  81 cm/sec CCA:  95 cm/sec SYSTOLIC ICA/CCA RATIO:  0.9 DIASTOLIC ICA/CCA RATIO:  1.5 ECA:  75 cm/sec RIGHT CAROTID ARTERY: Mild calcified plaque in the bulb. Low resistance internal carotid Doppler pattern is preserved. RIGHT VERTEBRAL ARTERY:  Antegrade. LEFT CAROTID ARTERY: Moderate irregular calcified plaque in the bulb. Low resistance internal carotid Doppler pattern is preserved. LEFT VERTEBRAL ARTERY:  Antegrade. Additional findings:  There is a 1.6 x 1.1 x 1.0 cm isoechoic nodule in the right lobe of the thyroid gland with punctate calcifications. Based on TI rads criteria, this nodule carries 6 points and a TR 4 rating. It meets criteria for fine needle aspiration biopsy. IMPRESSION: Less than 50% stenosis in the right and left internal carotid arteries. Suspicious right thyroid nodule which meets criteria for fine needle aspiration biopsy. Complete thyroid ultrasound is recommended followed by fine needle aspiration biopsy. Electronically Signed   By: Marybelle Killings M.D.   On: 07/25/2017 15:54   Dg Knee Complete 4 Views Left  Result Date: 07/25/2017 CLINICAL DATA:  Left knee pain after fall EXAM: LEFT KNEE - COMPLETE 4+ VIEW COMPARISON:  None. FINDINGS: No evidence of fracture, dislocation, or joint effusion. No evidence of arthropathy or other focal bone abnormality. Soft tissues are unremarkable. IMPRESSION: No left knee fracture, dislocation or joint effusion. Electronically Signed   By: Ilona Sorrel M.D.   On: 07/25/2017 11:52   Mr Jodene Nam Head/brain BT Cm  Result Date: 07/25/2017 CLINICAL DATA:  Syncope. Fall. Low back and leg pain. Left leg numbness. Facial numbness. EXAM: MRI HEAD WITHOUT CONTRAST MRA HEAD WITHOUT CONTRAST TECHNIQUE: Multiplanar, multiecho pulse sequences of the brain and surrounding structures were obtained without intravenous contrast. Angiographic images of the head were obtained using MRA technique without contrast. COMPARISON:  Head CT 07/25/2017 and MRI 07/24/2016 FINDINGS: MRI HEAD FINDINGS Brain: There is no evidence of acute infarct, intracranial hemorrhage, mass, midline shift, or extra-axial fluid collection. The ventricles and sulci are normal. Scattered punctate foci of cerebral white matter T2 hyperintensity are unchanged from the prior MRI and not greater than expected for patient's age. Vascular: Major intracranial vascular flow voids are preserved. Skull and upper cervical spine: Unremarkable bone  marrow signal. Sinuses/Orbits: Unremarkable orbits. Small left mastoid effusion. Clear paranasal sinuses. Other: None. MRA HEAD FINDINGS The visualized distal vertebral arteries are widely patent to the basilar and codominant. Dominant right PICA and left AICA vessels are identified. SCAs are patent bilaterally. The basilar artery is widely patent. Posterior communicating arteries are not clearly identified and may be absent or diminutive. PCAs are patent without evidence of  significant stenosis. The internal carotid arteries are patent from skull base to carotid termini. There is a moderate to severe stenosis of the distal left supraclinoid ICA, corresponding to prominent calcified plaque on CT. ACAs and MCAs are patent without evidence of proximal branch occlusion or significant stenosis. The anterior communicating artery is unremarkable. No aneurysm is identified. IMPRESSION: 1. No acute intracranial abnormality. Unremarkable MR appearance of the brain for age. 2. Patent Circle of Willis. 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 Electronically Signed   By: Logan Bores M.D.   On: 07/25/2017 15:04   Dg Hip Unilat W Or Wo Pelvis 2-3 Views Left  Result Date: 07/25/2017 CLINICAL DATA:  Acute left hip pain following fall yesterday. Initial encounter. EXAM: DG HIP (WITH OR WITHOUT PELVIS) 2-3V LEFT COMPARISON:  None. FINDINGS: There is no evidence of hip fracture or dislocation. There is no evidence of arthropathy or other focal bone abnormality. IMPRESSION: Negative. Electronically Signed   By: Margarette Canada M.D.   On: 07/25/2017 11:51    Assessment: 62 y.o. female presenting with paresthesias after a fall.  Symptoms not restricted to one side of the body.   - MRI brain no acute abnormality, but t2 shine through due to chronic HTN - MRI L spine consistent with degenerative disk dz - d/c planning from neuro stand point - pt had b/l carpal tunnel release surgeries - parasthesias in finger tips and  feel when cold Possibly Raynaud's - no further imaging from neuro stand point

## 2017-07-26 NOTE — Discharge Summary (Signed)
[facility] [place] NAME: Sarah [name]    MR#:  [id number]  DATE OF BIRTH:  [birth year]  DATE OF ADMISSION:  07/25/2017   ADMITTING PHYSICIAN: Bettey Costa, MD  DATE OF DISCHARGE: 07/26/2017  PRIMARY CARE PHYSICIAN: Glean Hess, MD   ADMISSION DIAGNOSIS:   Numbness in left leg [R20.0] Numbness and tingling of left side of face [R20.0, R20.2] Postural dizziness with presyncope [R42, R55] Fall (on) (from) other stairs and steps, initial encounter [W10.8XXA]  DISCHARGE DIAGNOSIS:   Active Problems:   TIA (transient ischemic attack)   SECONDARY DIAGNOSIS:   Past Medical History:  Diagnosis Date  . Anemia   . Anxiety   . Arthritis   . Bicuspid aortic valve   . CHF (congestive heart failure) ([place])   . CKD (chronic kidney disease) stage 3, GFR 30-59 ml/min (HCC)   . Depression   . Diabetes mellitus ([place])   . Dizziness   . Fatty liver   . Frequent falls   . GERD (gastroesophageal reflux disease)   . Gout   . Heart murmur   . History of blood transfusion   . 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 History of uterine fibroid   . Hypertension   . Hypomagnesemia   . Multiple myeloma ([place])   . Personal history of chemotherapy   . Renal cyst     HOSPITAL COURSE:   62 y/o F with past medical history significant for aortic stenosis, chronic diastolic heart failure, CK D stage III, anemia, depression and anxiety presents to hospital secondary to syncope  1. Syncope-stroke has been ruled out -MRI of the brain was negative for any acute infarcts. 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 -We'll start aspirin at discharge. Appreciate neurology consult -Could be vasovagal episode. -The left leg numbness he is from her ankle sprain. Nothing at this time. -Physical therapy recommended home health. -Echocardiogram pending, however patient has an outpatient stress test already scheduled with cardiology in a couple of weeks.  Continue to follow up with cardiology  2. Chronic pain syndrome-continue home medications. Patient also on fentanyl patch at home  3. Chronic diastolic heart failure-no signs of exacerbation. Patient takes Lasix as needed at home which she can continue  4. CKD stage III-creatinine at baseline  Continue other outpatient medications. Will be discharged with home health today  DISCHARGE CONDITIONS:   Guarded  CONSULTS OBTAINED:   Treatment Team:  Catarina Hartshorn, MD Yolonda Kida, MD  DRUG ALLERGIES:   Allergies  Allergen Reactions  . Oxycodone-Acetaminophen Anaphylaxis    Swelling and rash  . Benadryl [Diphenhydramine] Palpitations  . Morphine Itching and Rash  . Ondansetron Diarrhea    CAUSES PATIENT DIARRHEA CAUSES PATIENT DIARRHEA CAUSES PATIENT DIARRHEA CAUSES PATIENT DIARRHEA CAUSES PATIENT DIARRHEA  . Tylenol [Acetaminophen] Itching and Rash   DISCHARGE MEDICATIONS:   Allergies as of 07/26/2017      Reactions   Oxycodone-acetaminophen Anaphylaxis   Swelling and rash   Benadryl [diphenhydramine] Palpitations   Morphine Itching, Rash   Ondansetron Diarrhea   CAUSES PATIENT DIARRHEA CAUSES PATIENT DIARRHEA CAUSES PATIENT DIARRHEA CAUSES PATIENT DIARRHEA CAUSES PATIENT DIARRHEA   Tylenol [acetaminophen] Itching, Rash      Medication List    STOP taking these medications   montelukast 10 MG tablet Commonly known as:  SINGULAIR     TAKE these medications   acyclovir 200 MG capsule Commonly known as:  ZOVIRAX TAKE 2 CAPSULES(400 MG)  BY MOUTH THREE TIMES DAILY   allopurinol 100 MG tablet Commonly known as:  ZYLOPRIM TAKE 1 TABLET(100 MG) BY MOUTH DAILY as needed   aspirin 81 MG chewable tablet Chew 1 tablet (81 mg total) by mouth daily. Start taking on:  07/27/2017   Biotin 1 MG Caps Take 1 mg by mouth daily.   bisoprolol 10 MG tablet Commonly known as:  ZEBETA TAKE 1 TABLET(10 MG) BY MOUTH DAILY   cholestyramine 4 g packet Commonly  known as:  QUESTRAN Take 4 g once daily. What changed:    how much to take  how to take this  when to take this  reasons to take this  additional instructions   diphenoxylate-atropine 2.5-0.025 MG tablet Commonly known as:  LOMOTIL Take 2 tablets by mouth daily as needed for diarrhea or loose stools.   DULoxetine 60 MG capsule Commonly known as:  CYMBALTA Take 1 capsule (60 mg total) by mouth daily. What changed:  See the new instructions.   fentaNYL 25 MCG/HR patch Commonly known as:  DURAGESIC - dosed mcg/hr Place 25 mcg onto the skin every 3 (three) days.   fexofenadine 180 MG tablet Commonly known as:  ALLEGRA TAKE 1 TABLET(180 MG) BY MOUTH DAILY What changed:  See the new instructions.   fluticasone 50 MCG/ACT nasal spray Commonly known as:  FLONASE Place 2 sprays into both nostrils daily. What changed:    when to take this  reasons to take this   furosemide 20 MG tablet Commonly known as:  LASIX Take 0.5 tablets (10 mg total) by mouth daily as needed.   HAIR/SKIN/NAILS/BIOTIN PO Take 1 capsule by mouth 3 (three) times daily.   hydrOXYzine 10 MG tablet Commonly known as:  ATARAX/VISTARIL TAKE 1 TABLET(10 MG) BY MOUTH THREE TIMES DAILY AS NEEDED   omeprazole 40 MG capsule Commonly known as:  PRILOSEC Take 1 capsule (40 mg total) by mouth 2 (two) times daily. What changed:  when to take this   ONE TOUCH ULTRA TEST test strip Generic drug:  glucose blood   promethazine 25 MG tablet Commonly known as:  PHENERGAN TAKE 1 TO 2 TABLETS BY MOUTH TWICE DAILY BEFORE MEALS AS NEEDED   tiZANidine 4 MG tablet Commonly known as:  ZANAFLEX TAKE 1 TABLET BY MOUTH EVERY 8 HOURS AS NEEDED, MUSCLE RELAXER FOR BACK PAIN   traZODone 50 MG tablet Commonly known as:  DESYREL Take 50 mg by mouth at bedtime as needed.   zaleplon 10 MG capsule Commonly known as:  SONATA TAKE 1 CAPSULE BY MOUTH EVERY DAY AT BEDTIME AS NEEDED FOR SLEEP            Durable  Medical Equipment  (From admission, onward)        Start     Ordered   07/26/17 1020  For home use only DME Walker rolling  Once    Question:  Patient needs a walker to treat with the following condition  Answer:  Fall   07/26/17 1019       DISCHARGE INSTRUCTIONS:   1. PCP f/u in 1 week 2. Cardiology f/u in 2 weeks  DIET:   Cardiac diet  ACTIVITY:   Activity as tolerated  OXYGEN:   Home Oxygen: No.  Oxygen Delivery: room air  DISCHARGE LOCATION:   home   If you experience worsening of your admission symptoms, develop shortness of breath, life threatening emergency, suicidal or homicidal thoughts you must seek medical attention immediately by calling 911 or  calling your MD immediately  if symptoms less severe.  You Must read complete instructions/literature along with all the possible adverse reactions/side effects for all the Medicines you take and that have been prescribed to you. Take any new Medicines after you have completely understood and accpet all the possible adverse reactions/side effects.   Please note  You were cared for by a hospitalist during your hospital stay. If you have any questions about your discharge medications or the care you received while you were in the hospital after you are discharged, you can call the unit and asked to speak with the hospitalist on call if the hospitalist that took care of you is not available. Once you are discharged, your primary care physician will handle any further medical issues. Please note that NO REFILLS for any discharge medications will be authorized once you are discharged, as it is imperative that you return to your primary care physician (or establish a relationship with a primary care physician if you do not have one) for your aftercare needs so that they can reassess your need for medications and monitor your lab values.    On the day of Discharge:  VITAL SIGNS:   Blood pressure 114/83, pulse 78,  temperature 98.6 F (37 C), temperature source Oral, resp. rate 20, height 5' 2"  (1.575 m), weight 68.9 kg (151 lb 12.8 oz), SpO2 97 %.  PHYSICAL EXAMINATION:    GENERAL:  62 y.o.-year-old patient lying in the bed with no acute distress.  EYES: Pupils equal, round, reactive to light and accommodation. No scleral icterus. Extraocular muscles intact.  HEENT: Head atraumatic, normocephalic. Oropharynx and nasopharynx clear.  NECK:  Supple, no jugular venous distention. No thyroid enlargement, no tenderness.  LUNGS: Normal breath sounds bilaterally, no wheezing, rales,rhonchi or crepitation. No use of accessory muscles of respiration. Decreased bibasilar breath sounds CARDIOVASCULAR: S1, S2 normal. No  rubs, or gallops. 2/6 systolic murmur present ABDOMEN: Soft, non-tender, non-distended. Bowel sounds present. No organomegaly or mass.  EXTREMITIES: No pedal edema, cyanosis, or clubbing. Left ankle swelling NEUROLOGIC: Cranial nerves II through XII are intact. Muscle strength 5/5 in all extremities. Sensation intact. Gait not checked.  PSYCHIATRIC: The patient is alert and oriented x 3.  SKIN: No obvious rash, lesion, or ulcer.   DATA REVIEW:   CBC Recent Labs  Lab 07/26/17 0446  WBC 5.6  HGB 12.8  HCT 37.2  PLT 128*    Chemistries  Recent Labs  Lab 07/25/17 0948 07/26/17 0446  NA 137 137  K 4.3 4.5  CL 103 104  CO2 23 25  GLUCOSE 126* 134*  BUN 30* 27*  CREATININE 1.23* 1.02*  CALCIUM 10.3 9.4  AST 32  --   ALT 35  --   ALKPHOS 104  --   BILITOT 0.8  --      Microbiology Results  Results for orders placed or performed during the hospital encounter of 10/16/16  C difficile quick scan w PCR reflex     Status: None   Collection Time: 10/16/16  4:19 PM  Result Value Ref Range Status   C Diff antigen NEGATIVE NEGATIVE Final   C Diff toxin NEGATIVE NEGATIVE Final   C Diff interpretation No C. difficile detected.  Final    RADIOLOGY:  Dg Chest 2 View  Result Date:  07/25/2017 CLINICAL DATA:  Acute chest pain following fall yesterday. Initial encounter. EXAM: CHEST  2 VIEW COMPARISON:  05/21/2016 radiographs FINDINGS: The cardiomediastinal silhouette is unremarkable. There is no  evidence of focal airspace disease, pulmonary edema, suspicious pulmonary nodule/mass, pleural effusion, or pneumothorax. No acute bony abnormalities are identified. IMPRESSION: No active cardiopulmonary disease. Electronically Signed   By: Margarette Canada M.D.   On: 07/25/2017 11:53   Dg Cervical Spine Complete  Result Date: 07/25/2017 CLINICAL DATA:  Acute neck pain following fall yesterday. Initial encounter. EXAM: CERVICAL SPINE - COMPLETE 4+ VIEW COMPARISON:  05/28/2017 FINDINGS: There is no evidence of acute fracture, subluxation or prevertebral soft tissue swelling. Mild degenerative disc disease and spondylosis in the mid-lower cervical spine noted. No focal bony lesions are noted. IMPRESSION: No acute abnormality. Electronically Signed   By: Margarette Canada M.D.   On: 07/25/2017 11:56   Dg Thoracic Spine 2 View  Result Date: 07/25/2017 CLINICAL DATA:  Acute mid back pain following fall yesterday. Initial encounter. EXAM: THORACIC SPINE 2 VIEWS COMPARISON:  05/28/2017 FINDINGS: No acute fracture or subluxation identified. Anterior and lateral spondylosis again noted. No suspicious focal bony lesions are present. IMPRESSION: No acute abnormalities. Electronically Signed   By: Margarette Canada M.D.   On: 07/25/2017 11:55   Dg Ankle Complete Left  Result Date: 07/25/2017 CLINICAL DATA:  Acute left ankle pain following fall yesterday. Initial encounter. EXAM: LEFT ANKLE COMPLETE - 3+ VIEW COMPARISON:  None. FINDINGS: No acute fracture, subluxation or dislocation. The joint spaces are unremarkable. A small calcaneal spur is present. IMPRESSION: No acute bony abnormality. Electronically Signed   By: Margarette Canada M.D.   On: 07/25/2017 11:52   Ct Head Wo Contrast  Result Date: 07/25/2017 CLINICAL  DATA:  62 year old female with dizziness, syncope and fall yesterday. EXAM: CT HEAD WITHOUT CONTRAST TECHNIQUE: Contiguous axial images were obtained from the base of the skull through the vertex without intravenous contrast. COMPARISON:  07/24/2016 MR FINDINGS: Brain: No evidence of acute infarction, hemorrhage, hydrocephalus, extra-axial collection or mass lesion/mass effect. Mild atrophy noted. Vascular: Mild atherosclerotic calcifications noted. Skull: Normal. Negative for fracture or focal lesion. Sinuses/Orbits: No acute finding. Other: None. IMPRESSION: No evidence of acute intracranial abnormality Mild atrophy Electronically Signed   By: Margarette Canada M.D.   On: 07/25/2017 11:04   Mr Brain Wo Contrast  Result Date: 07/25/2017 CLINICAL DATA:  Syncope. Fall. Low back and leg pain. Left leg numbness. Facial numbness. EXAM: MRI HEAD WITHOUT CONTRAST MRA HEAD WITHOUT CONTRAST TECHNIQUE: Multiplanar, multiecho pulse sequences of the brain and surrounding structures were obtained without intravenous contrast. Angiographic images of the head were obtained using MRA technique without contrast. COMPARISON:  Head CT 07/25/2017 and MRI 07/24/2016 FINDINGS: MRI HEAD FINDINGS Brain: There is no evidence of acute infarct, intracranial hemorrhage, mass, midline shift, or extra-axial fluid collection. The ventricles and sulci are normal. Scattered punctate foci of cerebral white matter T2 hyperintensity are unchanged from the prior MRI and not greater than expected for patient's age. Vascular: Major intracranial vascular flow voids are preserved. Skull and upper cervical spine: Unremarkable bone marrow signal. Sinuses/Orbits: Unremarkable orbits. Small left mastoid effusion. Clear paranasal sinuses. Other: None. MRA HEAD FINDINGS The visualized distal vertebral arteries are widely patent to the basilar and codominant. Dominant right PICA and left AICA vessels are identified. SCAs are patent bilaterally. The basilar artery  is widely patent. Posterior communicating arteries are not clearly identified and may be absent or diminutive. PCAs are patent without evidence of significant stenosis. The internal carotid arteries are patent from skull base to carotid termini. There is a moderate to severe stenosis of the distal left supraclinoid ICA, corresponding to  prominent calcified plaque on CT. ACAs and MCAs are patent without evidence of proximal branch occlusion or significant stenosis. The anterior communicating artery is unremarkable. No aneurysm is identified. IMPRESSION: 1. No acute intracranial abnormality. Unremarkable MR appearance of the brain for age. 2. Patent Circle of Willis. 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 Electronically Signed   By: Logan Bores M.D.   On: 07/25/2017 15:04   Mr Lumbar Spine Wo Contrast  Result Date: 07/25/2017 CLINICAL DATA:  Generalized low back pain with leg swelling. Chronic kidney disease. History of diabetes and congestive heart failure. EXAM: MRI LUMBAR SPINE WITHOUT CONTRAST TECHNIQUE: Multiplanar, multisequence MR imaging of the lumbar spine was performed. No intravenous contrast was administered. COMPARISON:  Abdominopelvic CT 10/16/2016. FINDINGS: Segmentation: Conventional anatomy assumed, with the last open disc space designated L5-S1. Using this numbering, there are small ribs at T12 and L5 is transitional. Alignment:  Normal. Vertebrae: No worrisome osseous lesion, acute fracture or pars defect. The lumbar pedicles are diffusely short on a congenital basis. There is prominent facet hypertrophy at L4-5 with subchondral edema on the right, extending into the right L5 pedicle. The visualized sacroiliac joints appear unremarkable. Conus medullaris: Extends to the T12 level and appears normal. Paraspinal and other soft tissues: No significant paraspinal findings. Small renal cysts bilaterally. Disc levels: No significant disc space findings at T11-12 or T12-L1. L1-2: Mild disc  bulging and facet hypertrophy. No spinal stenosis or nerve root encroachment. L2-3: Mild disc bulging with facet and ligamentous hypertrophy, contributing to mild spinal stenosis. The lateral recesses and foramina are sufficiently patent. L3-4: Mild disc bulging with facet and ligamentous hypertrophy, contributing to mild spinal stenosis. The foramina and lateral recesses are sufficiently patent. L4-5: Mild loss of disc height with annular disc bulging and a broad-based central disc protrusion, similar to previous CT. There is advanced facet and ligamentous hypertrophy with small posteriorly projecting synovial cysts. These findings contribute to moderate multifactorial spinal stenosis, moderate right foraminal narrowing and mild narrowing the left foramen and lateral recesses L5-S1: As numbered, this disc space is somewhat transitional with bilateral lumbosacral assimilation joints. Disc height and hydration are maintained. No spinal stenosis or nerve root encroachment. IMPRESSION: 1. Slightly transitional lumbosacral anatomy. Transitional segment assigned L5. 2. Moderate multifactorial spinal stenosis at L4-5 with moderate asymmetric right foraminal narrowing. Broad-based central disc protrusion appears chronic. Bilateral facet hypertrophy with subchondral edema and small posteriorly projecting synovial cysts. 3. Mild multifactorial spinal stenosis at L2-3 and L3-4. Electronically Signed   By: Richardean Sale M.D.   On: 07/25/2017 15:10   US Carotid Bilateral (at [facility] Only)  Result Date: 07/25/2017 CLINICAL DATA:  Stroke EXAM: BILATERAL CAROTID DUPLEX ULTRASOUND TECHNIQUE: Pearline Cables scale imaging, color Doppler and duplex ultrasound were performed of bilateral carotid and vertebral arteries in the neck. COMPARISON:  None. FINDINGS: Criteria: Quantification of carotid stenosis is based on velocity parameters that correlate the residual internal carotid diameter with NASCET-based stenosis levels, using the  diameter of the distal internal carotid lumen as the denominator for stenosis measurement. The following velocity measurements were obtained: RIGHT ICA:  74 cm/sec CCA:  72 cm/sec SYSTOLIC ICA/CCA RATIO:  1.0 DIASTOLIC ICA/CCA RATIO:  1.3 ECA:  64 cm/sec LEFT ICA:  81 cm/sec CCA:  95 cm/sec SYSTOLIC ICA/CCA RATIO:  0.9 DIASTOLIC ICA/CCA RATIO:  1.5 ECA:  75 cm/sec RIGHT CAROTID ARTERY: Mild calcified plaque in the bulb. Low resistance internal carotid Doppler pattern is preserved. RIGHT VERTEBRAL ARTERY:  Antegrade. LEFT CAROTID ARTERY:  Moderate irregular calcified plaque in the bulb. Low resistance internal carotid Doppler pattern is preserved. LEFT VERTEBRAL ARTERY:  Antegrade. Additional findings: There is a 1.6 x 1.1 x 1.0 cm isoechoic nodule in the right lobe of the thyroid gland with punctate calcifications. Based on TI rads criteria, this nodule carries 6 points and a TR 4 rating. It meets criteria for fine needle aspiration biopsy. IMPRESSION: Less than 50% stenosis in the right and left internal carotid arteries. Suspicious right thyroid nodule which meets criteria for fine needle aspiration biopsy. Complete thyroid ultrasound is recommended followed by fine needle aspiration biopsy. Electronically Signed   By: Marybelle Killings M.D.   On: 07/25/2017 15:54   Dg Knee Complete 4 Views Left  Result Date: 07/25/2017 CLINICAL DATA:  Left knee pain after fall EXAM: LEFT KNEE - COMPLETE 4+ VIEW COMPARISON:  None. FINDINGS: No evidence of fracture, dislocation, or joint effusion. No evidence of arthropathy or other focal bone abnormality. Soft tissues are unremarkable. IMPRESSION: No left knee fracture, dislocation or joint effusion. Electronically Signed   By: Ilona Sorrel M.D.   On: 07/25/2017 11:52   Mr Jodene Nam Head/brain YY Cm  Result Date: 07/25/2017 CLINICAL DATA:  Syncope. Fall. Low back and leg pain. Left leg numbness. Facial numbness. EXAM: MRI HEAD WITHOUT CONTRAST MRA HEAD WITHOUT CONTRAST TECHNIQUE:  Multiplanar, multiecho pulse sequences of the brain and surrounding structures were obtained without intravenous contrast. Angiographic images of the head were obtained using MRA technique without contrast. COMPARISON:  Head CT 07/25/2017 and MRI 07/24/2016 FINDINGS: MRI HEAD FINDINGS Brain: There is no evidence of acute infarct, intracranial hemorrhage, mass, midline shift, or extra-axial fluid collection. The ventricles and sulci are normal. Scattered punctate foci of cerebral white matter T2 hyperintensity are unchanged from the prior MRI and not greater than expected for patient's age. Vascular: Major intracranial vascular flow voids are preserved. Skull and upper cervical spine: Unremarkable bone marrow signal. Sinuses/Orbits: Unremarkable orbits. Small left mastoid effusion. Clear paranasal sinuses. Other: None. MRA HEAD FINDINGS The visualized distal vertebral arteries are widely patent to the basilar and codominant. Dominant right PICA and left AICA vessels are identified. SCAs are patent bilaterally. The basilar artery is widely patent. Posterior communicating arteries are not clearly identified and may be absent or diminutive. PCAs are patent without evidence of significant stenosis. The internal carotid arteries are patent from skull base to carotid termini. There is a moderate to severe stenosis of the distal left supraclinoid ICA, corresponding to prominent calcified plaque on CT. ACAs and MCAs are patent without evidence of proximal branch occlusion or significant stenosis. The anterior communicating artery is unremarkable. No aneurysm is identified. IMPRESSION: 1. No acute intracranial abnormality. Unremarkable MR appearance of the brain for age. 2. Patent Circle of Willis. 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 Electronically Signed   By: Logan Bores M.D.   On: 07/25/2017 15:04   Dg Hip Unilat W Or Wo Pelvis 2-3 Views Left  Result Date: 07/25/2017 CLINICAL DATA:  Acute left hip pain  following fall yesterday. Initial encounter. EXAM: DG HIP (WITH OR WITHOUT PELVIS) 2-3V LEFT COMPARISON:  None. FINDINGS: There is no evidence of hip fracture or dislocation. There is no evidence of arthropathy or other focal bone abnormality. IMPRESSION: Negative. Electronically Signed   By: Margarette Canada M.D.   On: 07/25/2017 11:51     Management plans discussed with the patient, family and they are in agreement.  CODE STATUS:     Code Status Orders  (  From admission, onward)        Start     Ordered   07/25/17 1258  Full code  Continuous     07/25/17 1257    Code Status History    Date Active Date Inactive Code Status Order ID Comments User Context   07/25/2017 12:57 07/25/2017 12:58 Full Code [id number]  Bettey Costa, MD Inpatient      TOTAL TIME TAKING CARE OF THIS PATIENT: 38 minutes.    Gladstone Lighter M.D on 07/26/2017 at 10:26 AM  Between 7am to 6pm - Pager - [phone]  After 6pm go to [email or web address] - Proofreader  [facility]  Office  [phone]  CC: Primary care physician; Glean Hess, MD   Note: This dictation was prepared with Dragon dictation along with smaller phrase technology. Any transcriptional errors that result from this process are unintentional.

## 2017-07-26 NOTE — Evaluation (Signed)
Occupational Therapy Evaluation Patient Details Name: Sarah [name] MRN: [id number] DOB: 1955-10-28 Today's Date: 07/26/2017    History of Present Illness 62 y.o. female with a known history of CKD stage 3, mild nonrheumatic aortic valve stenosis, chronic diastolic heart failure, multiple falls, lumbar ablation 11/18.  She had a syncopal episode with fall 1/24 with L LE numbness, ankle pain/bruising.   Clinical Impression   Upon evalution pt up in chair - very pleasant and cooperative. Ice pack on L ankle  Show bilateral UE strength and AROM WFL - no pain except at L ankle  Pt show being independency in UB and LB ADL's - need walker with standing activities to take weight off L ankle. But did not show any LOB during that - but did show one time LOB during functional mobility to bathroom with negotiating the door and walker . Pt report that she lives with sister and likes to take bathes - recommend for pt to wait until ankle is better and stronger and also getting some rails for  Edge of bath. Pt verbalize understanding and  Agreeing .      Follow Up Recommendations       Equipment Recommendations       Recommendations for Other Services       Precautions / Restrictions Precautions Precautions: Fall Restrictions Weight Bearing Restrictions: No Other Position/Activity Restrictions: But L ankle sprain - use walker to keep weight off       Mobility Bed Mobility                  Transfers                      Balance                                           ADL either performed or assessed with clinical judgement   ADL Overall ADL's : At baseline                                       General ADL Comments: Independent in UB and LB dressing with setup , bathing with setup, toiletting - but need walker - somewhat fast  and impulsive - showed one time LOB coming out of toilet      Vision Baseline Vision/History: Wears  glasses       Perception     Praxis      Pertinent Vitals/Pain Pain Assessment: 0-10 Pain Score: 6  Pain Location: L ankle     Hand Dominance Right   Extremity/Trunk Assessment             Communication Communication Communication: No difficulties   Cognition Arousal/Alertness: Awake/alert Behavior During Therapy: WFL for tasks assessed/performed Overall Cognitive Status: Within Functional Limits for tasks assessed                                     General Comments       Exercises     Shoulder Instructions      Home Living Family/patient expects to be discharged to:: Private residence Living Arrangements: (With her sister)  Bathroom Shower/Tub: Advertising copywriter: Yes   Home Equipment: Cane - single point          Prior Functioning/Environment Level of Independence: Independent                 OT Problem List:        OT Treatment/Interventions:      OT Goals(Current goals can be found in the care plan section) Acute Rehab OT Goals Patient Stated Goal: go home  OT Frequency:     Barriers to D/C:            Co-evaluation              AM-PAC PT "6 Clicks" Daily Activity     Outcome Measure                 End of Session    Activity Tolerance:   Patient left:                     Time: 1025-1047 OT Time Calculation (min): 22 min Charges:  OT General Charges $OT Visit: 1 Visit OT Evaluation $OT Eval Low Complexity: 1 Low G-Codes: OT G-codes **NOT FOR INPATIENT CLASS** Functional Limitation: Mobility: Walking and moving around     [employer] OTR/L,CLT 07/26/2017, 11:22 AM

## 2017-07-26 NOTE — Progress Notes (Signed)
SLP Cancellation Note  Patient Details Name: Sarah [name] MRN: [id number] DOB: [birth year]   Cancelled treatment:       Reason Eval/Treat Not Completed: SLP screened, no needs identified, will sign off. Reviewed chart and spoke with MD and patient. Pt denies any swallowing or speech/language deficits. No reports of any deficits in chart. MD reports that stroke has been ruled out. No speech/language or swallowing needs identified. Will sign off on pt. MD and pt in agreement. Please re-consult if further concerns arise.   Charlean Sanfilippo, [place], [employer]  Speech-Language Pathologist    [place] 07/26/2017, 9:34 AM

## 2017-07-26 NOTE — Care Management Note (Signed)
Case Management Note  Patient Details  Name: Sarah [name] MRN: [id number] Date of Birth: 12-09-1955  Subjective/Objective:     A referral for HH=PT and a RW to be delivered to [name] hospital room today was called to Melene Muller at [facility].             Action/Plan:   Expected Discharge Date:  07/26/17               Expected Discharge Plan:  [place]  In-House Referral:     Discharge planning Services  CM Consult  Post Acute Care Choice:  Home Health Choice offered to:     DME Arranged:  [name] DME Agency:  [place] Arranged:  PT [facility] Agency:  [place]  Status of Service:  Completed, signed off  If discussed at [place] of Stay Meetings, dates discussed:    Additional Comments:  [name] A, RN 07/26/2017, 10:48 AM

## 2017-07-26 NOTE — Progress Notes (Signed)
Discharged to home with her sister.  She lives with her sister.  Patient will receive follow up with her PCP.  [name] was obtained for her and she is taking it home.

## 2017-07-27 ENCOUNTER — Other Ambulatory Visit: Payer: Self-pay | Admitting: Internal Medicine

## 2017-07-27 LAB — HIV ANTIBODY (ROUTINE TESTING W REFLEX): HIV SCREEN 4TH GENERATION: NONREACTIVE

## 2017-07-28 ENCOUNTER — Telehealth: Payer: Self-pay

## 2017-07-28 NOTE — Telephone Encounter (Signed)
TOC #1. Called to f/u after d/c from [facility] on 07/26/17. Also wanted to confirm hosp f/u appt w/ Dr. Army Melia on 08/06/17 @ 3:00pm. Pt was to be discharged to [facility] and will also require f/u with cardiology. Wanted to ensure this has been taken care of as well. LVM requesting returned call.

## 2017-07-28 NOTE — Telephone Encounter (Signed)
I don't do the home health referral from the hospital.  The discharge planner should have done that already.

## 2017-07-29 ENCOUNTER — Inpatient Hospital Stay: Payer: BLUE CROSS/BLUE SHIELD

## 2017-07-29 ENCOUNTER — Other Ambulatory Visit: Payer: Self-pay | Admitting: Internal Medicine

## 2017-07-29 ENCOUNTER — Telehealth: Payer: Self-pay

## 2017-07-29 DIAGNOSIS — C9001 Multiple myeloma in remission: Secondary | ICD-10-CM | POA: Diagnosis not present

## 2017-07-29 LAB — MAGNESIUM: MAGNESIUM: 1.4 mg/dL — AB (ref 1.7–2.4)

## 2017-07-29 MED ORDER — MAGNESIUM SULFATE 2 GM/50ML IV SOLN
2.0000 g | Freq: Once | INTRAVENOUS | Status: AC
  Administered 2017-07-29: 2 g via INTRAVENOUS
  Filled 2017-07-29: qty 50

## 2017-07-29 MED ORDER — SODIUM CHLORIDE 0.9 % IV SOLN
Freq: Once | INTRAVENOUS | Status: AC
  Administered 2017-07-29: 10:00:00 via INTRAVENOUS
  Filled 2017-07-29: qty 1000

## 2017-07-29 MED ORDER — MAGNESIUM SULFATE 50 % IJ SOLN: 500.0000 mg | Freq: Once | INTRAVENOUS | Status: DC

## 2017-07-29 NOTE — Patient Instructions (Signed)
Hypomagnesemia  Hypomagnesemia is a condition in which the level of magnesium in the blood is low. Magnesium is a mineral that is found in many foods. It is used in many different processes in the body. Hypomagnesemia can affect every organ in the body. It can cause life-threatening problems.  What are the causes?  Causes of hypomagnesemia include:   Not getting enough magnesium in your diet.   Malnutrition.   Problems with absorbing magnesium from the intestines.   Dehydration.   Alcohol abuse.   Vomiting.   Severe diarrhea.   Some medicines, including medicines that make you urinate more.   Certain diseases, such as kidney disease, diabetes, and overactive thyroid.    What are the signs or symptoms?   Involuntary shaking or trembling of a body part (tremor).   Confusion.   Muscle weakness.   Sensitivity to light, sound, and touch.   Psychiatric issues, such as depression, irritability, or psychosis.   Sudden tightening of muscles (muscle spasms).   Tingling in the arms and legs.   A feeling of fluttering of the heart.  These symptoms are more severe if magnesium levels drop suddenly.  How is this diagnosed?  To make a diagnosis, your health care provider will do a physical exam and order blood and urine tests.  How is this treated?  Treatment will depend on the cause and the severity of your condition. It may involve:   A magnesium supplement. This can be taken in pill form. It can also be given through an IV tube. This is usually done if the condition is severe.   Changes to your diet. You may be directed to eat foods that have a lot of magnesium, such as green leafy vegetables, peas, beans, and nuts.   Eliminating alcohol from your diet.    Follow these instructions at home:   Include foods with magnesium in your diet. Foods that are rich in magnesium include green vegetables, beans, nuts and seeds, and whole grains.   Take medicines only as directed by your health care provider.   Take  magnesium supplements if your health care provider instructs you to do that. Take them as directed.   Have your magnesium levels monitored as directed by your health care provider.   When you are active, drink fluids that contain electrolytes.   Keep all follow-up visits as directed by your health care provider. This is important.  Contact a health care provider if:   You get worse instead of better.   Your symptoms return.  Get help right away if:   Your symptoms are severe.  This information is not intended to replace advice given to you by your health care provider. Make sure you discuss any questions you have with your health care provider.  Document Released: 03/13/2005 Document Revised: 11/23/2015 Document Reviewed: 01/31/2014  Elsevier Interactive Patient Education  2018 [employer]vier [employer]

## 2017-07-29 NOTE — Progress Notes (Signed)
Patient reports that she blacked out and fell on Thursday, July 24, 2017 and took herself to the ER on Friday, January 25th after she noticed her "face going numb" while she was sleeping.  Patient said that the ER doctor felt she had experienced a TIA with left sided weakness".  She is currently using a cane to help with stability while ambulating.  She said that she injured her left ankle and hit her head.  MRIs and xrays were performed in the ER with a thyroid nodule noted.  Patient has appointments lined up for August 21, 2017 for ECHO and stress test with her Cardiologist.  Her medications have changed with an addition of 81mg  of ASA and her Cymbalta was decreased to one 60 mg tablet.  Left ankle is not swollen or discolored at this time.  Update given to Dr. Rogue Bussing.  Magnesium is 1.4 today and patient is getting 2grams of Magnesium IV.

## 2017-07-29 NOTE — Telephone Encounter (Signed)
TOC#2. Called to f/u after d/c from [facility] on 07/26/17. Also wanted to confirm hosp f/u appt w/ Dr. Army Melia on 08/06/17 @ 3:00pm. Discharge planning also included: d/c home w/ [facility] and to f/u w/ cardiology. Wanted to ensure this has been taken care of as well. LVM requesting returned call.

## 2017-07-30 ENCOUNTER — Ambulatory Visit: Payer: Self-pay | Admitting: Internal Medicine

## 2017-07-30 NOTE — Consult Note (Signed)
Reason for Consult: Frequent falls focal weakness murmur syncope Referring Physician: Dr. Benjie Karvonen hospitalist primary physician Dr Halina Maidens primary  Sarah [name] is an 62 y.o. female.  HPI: Patient's present with chronic renal insufficiency mild nonrheumatic aortic valve stenosis thought to possibly bicuspid she has had diastolic heart failure with preserved left ventricular function had multiple falls with leg pain now presents with recurrent syncope witnessed by her husband.  Patient reportedly had 3 multiple recurrent short episodes of syncope while at home denied any chest pain no injuries did not have loss of consciousness for very long no incontinence patient is complaining of increasing low back pain and left leg numbness.  Patient gives a history of back surgery in November and had chronic narcotic use because of chronic low back pain.  Patient reportedly had CT of the head which was there was concern about TIA CVA so neurology input was suggested.  No clear evidence of arrhythmia patient had a history of ablation in the past.  Denies any significant palpitations or tachycardia  Past Medical History:  Diagnosis Date  . Anemia   . Anxiety   . Arthritis   . Bicuspid aortic valve   . CHF (congestive heart failure) ([place])   . CKD (chronic kidney disease) stage 3, GFR 30-59 ml/min (HCC)   . Depression   . Diabetes mellitus ([place])   . Dizziness   . Fatty liver   . Frequent falls   . GERD (gastroesophageal reflux disease)   . Gout   . Heart murmur   . History of blood transfusion   . History of bone marrow transplant ([place])   . History of uterine fibroid   . Hypertension   . Hypomagnesemia   . Multiple myeloma ([place])   . Personal history of chemotherapy   . Renal cyst     Past Surgical History:  Procedure Laterality Date  . ABDOMINAL HYSTERECTOMY    . 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 CARDIAC ELECTROPHYSIOLOGY MAPPING AND ABLATION    . CARPAL TUNNEL RELEASE Bilateral   .  CHOLECYSTECTOMY  2008  . COLONOSCOPY WITH PROPOFOL N/A 05/07/2017   Procedure: COLONOSCOPY WITH PROPOFOL;  Surgeon: [name], MD;  [facility] ENDOSCOPY;  Service: Gastroenterology;  Laterality: N/A;  . ESOPHAGOGASTRODUODENOSCOPY (EGD) WITH PROPOFOL N/A 05/07/2017   Procedure: ESOPHAGOGASTRODUODENOSCOPY (EGD) WITH PROPOFOL;  Surgeon: [name], MD;  Location: [facility] ENDOSCOPY;  Service: Gastroenterology;  Laterality: N/A;  . FOOT SURGERY Bilateral   . INCONTINENCE SURGERY  2009  . PARTIAL HYSTERECTOMY  03/1996   fibroids  . TONSILLECTOMY  2007    Family History  Problem Relation Age of Onset  . Colon cancer Father   . Renal Disease Father   . Diabetes Mellitus II Father   . Melanoma Paternal Grandmother   . Breast cancer Maternal Aunt 75  . Anemia Mother   . Heart disease Mother   . Heart failure Mother   . Renal Disease Mother   . Congestive Heart Failure Mother   . Heart disease Maternal Uncle   . Throat cancer Maternal Uncle   . Lung cancer Maternal Uncle   . Liver disease Maternal Uncle   . Heart failure Maternal Uncle   . Hearing loss Son 49       Suicide     Social History:  reports that she quit smoking about 26 years ago. Her smoking use included cigarettes. She has a 20.00 pack-year smoking history. she has never used smokeless tobacco. She reports  that she drinks about 1.2 oz of alcohol per week. She reports that she does not use drugs.  Allergies:  Allergies  Allergen Reactions  . Oxycodone-Acetaminophen Anaphylaxis    Swelling and rash  . Benadryl [Diphenhydramine] Palpitations  . Morphine Itching and Rash  . Ondansetron Diarrhea    _0   . Tylenol [Acetaminophen] Itching and Rash    Medications: I have reviewed the patient's current medications.  No results found for this or any previous visit (from the past 48 hour(s)).  No results  found.  Review of Systems  Constitutional: Positive for malaise/fatigue.  HENT: Negative.   Eyes: Negative.   Respiratory: Negative.   Cardiovascular: Negative.   Gastrointestinal: Negative.   Genitourinary: Negative.   Musculoskeletal: Positive for back pain, falls and myalgias.  Skin: Negative.   Neurological: Positive for dizziness, loss of consciousness and weakness.  Endo/Heme/Allergies: Negative.   Psychiatric/Behavioral: Negative.    Blood pressure 114/83, pulse 78, temperature 98.6 F (37 C), temperature source Oral, resp. rate 20, height _1  (1.575 m), weight 151 lb 12.8 oz (68.9 kg), SpO2 97 %. Physical Exam  Nursing note and vitals reviewed. Constitutional: She is oriented to person, place, and time. She appears well-developed and well-nourished.  HENT:  Head: Normocephalic and atraumatic.  Eyes: Conjunctivae and EOM are normal. Pupils are equal, round, and reactive to light.  Neck: Normal range of motion. Neck supple.  Cardiovascular: Normal rate, regular rhythm and normal heart sounds.  Respiratory: Effort normal and breath sounds normal.  GI: Soft. Bowel sounds are normal.  Musculoskeletal: Normal range of motion.  Neurological: She is alert and oriented to person, place, and time. She has normal reflexes.  Skin: Skin is dry.  Psychiatric: She has a normal mood and affect.    Assessment/Plan: Syncope Falls multiple Focal weakness Chronic renal insufficiency stage III Anemia Anxiety Bicuspid aortic valve nonrheumatic Congestive heart failure with diastolic dysfunction Depression Diabetes Murmur GERD Hypertension Bone marrow transplant History of multiple myeloma History of ablation Previous history of smoking . Plan Agree to admission to telemetry for further evaluation Consider neurology evaluation syncope and fall Agree with echocardiogram of the heart for evaluation of murmur aortic valve Agree with CT of the head consider MRI Proceed with  carotid Dopplers for further assessment Recommend anticoagulation with 81 mg of aspirin Follow-up lipid panel and consider statin therapy Evaluate for orthostatic hypotension Continue Cymbalta for mild depression Borderline diabetes diet-controlled continue current therapy Agree with fentanyl patch for chronic pain syndrome Continue Lasix therapy as needed for diastolic heart failure Have patient follow-up with cardiology as an outpatient   Dwayne D Callwood 07/30/2017, 8:36 AM

## 2017-08-03 ENCOUNTER — Other Ambulatory Visit: Payer: Self-pay | Admitting: Internal Medicine

## 2017-08-05 ENCOUNTER — Inpatient Hospital Stay: Payer: BLUE CROSS/BLUE SHIELD | Attending: Internal Medicine

## 2017-08-05 ENCOUNTER — Inpatient Hospital Stay: Payer: BLUE CROSS/BLUE SHIELD

## 2017-08-05 ENCOUNTER — Ambulatory Visit: Payer: BLUE CROSS/BLUE SHIELD

## 2017-08-05 VITALS — BP 122/77 | HR 73 | Temp 96.3°F | Resp 20

## 2017-08-05 DIAGNOSIS — C9001 Multiple myeloma in remission: Secondary | ICD-10-CM | POA: Diagnosis present

## 2017-08-05 LAB — MAGNESIUM: MAGNESIUM: 1.4 mg/dL — AB (ref 1.7–2.4)

## 2017-08-05 MED ORDER — MAGNESIUM SULFATE 2 GM/50ML IV SOLN
2.0000 g | Freq: Once | INTRAVENOUS | Status: AC
  Administered 2017-08-05: 2 g via INTRAVENOUS
  Filled 2017-08-05: qty 50

## 2017-08-05 MED ORDER — SODIUM CHLORIDE 0.9 % IV SOLN
Freq: Once | INTRAVENOUS | Status: AC
  Administered 2017-08-05: 09:00:00 via INTRAVENOUS
  Filled 2017-08-05: qty 1000

## 2017-08-05 NOTE — Telephone Encounter (Signed)
Okay to refill? 

## 2017-08-05 NOTE — Patient Instructions (Signed)
Magnesium Sulfate injection What is this medicine? MAGNESIUM SULFATE (mag NEE zee um SUL fate) is an electrolyte injection commonly used to treat low magnesium levels in your blood. It is also used to prevent or control seizures in women with preeclampsia or eclampsia. This medicine may be used for other purposes; ask your health care provider or pharmacist if you have questions. What should I tell my health care provider before I take this medicine? They need to know if you have any of these conditions: -heart disease -history of irregular heart beat -kidney disease -an unusual or allergic reaction to magnesium sulfate, medicines, foods, dyes, or preservatives -pregnant or trying to get pregnant -breast-feeding How should I use this medicine? This medicine is for infusion into a vein. It is given by a health care professional in a hospital or clinic setting. Talk to your pediatrician regarding the use of this medicine in children. While this drug may be prescribed for selected conditions, precautions do apply. Overdosage: If you think you have taken too much of this medicine contact a poison control center or emergency room at once. NOTE: This medicine is only for you. Do not share this medicine with others. What if I miss a dose? This does not apply. What may interact with this medicine? This medicine may interact with the following medications: -certain medicines for anxiety or sleep -certain medicines for seizures like phenobarbital -digoxin -medicines that relax muscles for surgery -narcotic medicines for pain This list may not describe all possible interactions. Give your health care provider a list of all the medicines, herbs, non-prescription drugs, or dietary supplements you use. Also tell them if you smoke, drink alcohol, or use illegal drugs. Some items may interact with your medicine. What should I watch for while using this medicine? Your condition will be monitored carefully  while you are receiving this medicine. You may need blood work done while you are receiving this medicine. What side effects may I notice from receiving this medicine? Side effects that you should report to your doctor or health care professional as soon as possible: -allergic reactions like skin rash, itching or hives, swelling of the face, lips, or tongue -facial flushing -muscle weakness -signs and symptoms of low blood pressure like dizziness; feeling faint or lightheaded, falls; unusually weak or tired -signs and symptoms of a dangerous change in heartbeat or heart rhythm like chest pain; dizziness; fast or irregular heartbeat; palpitations; breathing problems -sweating This list may not describe all possible side effects. Call your doctor for medical advice about side effects. You may report side effects to FDA at 1-800-FDA-1088. Where should I keep my medicine? This drug is given in a hospital or clinic and will not be stored at home. NOTE: This sheet is a summary. It may not cover all possible information. If you have questions about this medicine, talk to your doctor, pharmacist, or health care provider.  2018 Elsevier/Gold Standard (2016-01-03 12:31:42)  

## 2017-08-06 ENCOUNTER — Encounter: Payer: Self-pay | Admitting: Internal Medicine

## 2017-08-06 ENCOUNTER — Ambulatory Visit: Payer: BLUE CROSS/BLUE SHIELD | Admitting: Internal Medicine

## 2017-08-06 VITALS — BP 138/80 | HR 103 | Ht 62.0 in | Wt 156.0 lb

## 2017-08-06 DIAGNOSIS — S93402A Sprain of unspecified ligament of left ankle, initial encounter: Secondary | ICD-10-CM | POA: Diagnosis not present

## 2017-08-06 DIAGNOSIS — I6523 Occlusion and stenosis of bilateral carotid arteries: Secondary | ICD-10-CM

## 2017-08-06 DIAGNOSIS — S20212A Contusion of left front wall of thorax, initial encounter: Secondary | ICD-10-CM

## 2017-08-06 DIAGNOSIS — E041 Nontoxic single thyroid nodule: Secondary | ICD-10-CM

## 2017-08-06 DIAGNOSIS — S298XXA Other specified injuries of thorax, initial encounter: Secondary | ICD-10-CM

## 2017-08-06 NOTE — Progress Notes (Signed)
Date:  08/06/2017   Name:  Sarah [name]   DOB:  [birth year]   MRN:  [id number]   Chief Complaint: Hospitalization Follow-up (Feeling better but left ankle is still hurting from when she fell. Still having dizziness, and pain in left rib. She states the pain is a stabbing pain and conintuous. Unsure if from fall she had or something else going on. )  Admitted to [facility] 07/25/17 to 07/26/17 for stroke workup.  She received a call from Nurse on 07/28/17. MRI was negative except for: 1. No acute intracranial abnormality. Unremarkable MR appearance of the brain for age. 2. Patent Circle of Willis. Moderate to severe left supraclinoid ICA stenosis   US carotid < 50% stenosis bilaterally.  There was a thyroid nodule noted. Less than 50% stenosis in the right and left internal carotid arteries.  Suspicious right thyroid nodule which meets criteria for fine needle aspiration biopsy. Complete thyroid ultrasound is recommended followed by fine needle aspiration biopsy. Since being home she has had no further sx.  Her dizziness is chronic and unchanged.  Ankle pain - sprained her ankle when she fell from the syncope.  Xrays were normal.  She has less swelling but still painful and hurts with weight bearing.  Rib pain - likely hit her left ribs on the table when she fell.  CXR was negative for rib fracture.  She still has some discomfort there but no bruising.  No difficulty breathing.  Thyroid nodule - in right thyroid, meets criteria for bx.  CAS - noted on [place].  Should consider cholesterol lowering medication to reduce risk.   Review of Systems  Constitutional: Negative for chills, fatigue and fever.  HENT: Negative for trouble swallowing.   Respiratory: Negative for chest tightness and shortness of breath.   Cardiovascular: Negative for chest pain.  Gastrointestinal: Negative for abdominal pain and diarrhea.  Musculoskeletal: Positive for back pain.  Neurological: Positive for dizziness.  Negative for tremors, numbness and headaches.  Psychiatric/Behavioral: Negative for dysphoric mood. The patient is not nervous/anxious.     Patient Active Problem List   Diagnosis Date Noted  . Carotid artery stenosis, asymptomatic, bilateral 08/06/2017  . TIA (transient ischemic attack) 07/25/2017  . Iron deficiency anemia due to chronic blood loss 05/13/2017  . Spinal stenosis of lumbar region with neurogenic claudication 04/09/2017  . Magnesium deficiency 04/08/2017  . 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 Dizziness 10/10/2016  . LBBB (left bundle branch block) 10/10/2016  . Long term prescription opiate use 09/07/2016  . Long term current use of opiate analgesic 07/09/2016  . Chronic pain syndrome 07/08/2016  . Pain medication agreement signed 07/08/2016  . Spondylosis of lumbar region without myelopathy or radiculopathy 07/08/2016  . Lumbar spondylosis 06/06/2016  . Chronic diastolic CHF (congestive heart failure), NYHA class 2 ([place]) 06/05/2016  . Hx of cardiac catheterization 06/05/2016  . LVH (left ventricular hypertrophy) due to hypertensive disease, with heart failure ([place]) 06/05/2016  . Mild aortic valve stenosis 06/05/2016  . 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 Sinus congestion 05/30/2016  . Environmental and seasonal allergies 04/19/2016  . Insomnia 04/19/2016  . Bicuspid aortic valve 04/19/2016  . Asthma 04/19/2016  . Aortic regurgitation 04/19/2016  . History of CHF (congestive heart failure) 04/19/2016  . DM (diabetes mellitus), type 2 with renal complications ([place]) 35/45/6256  . Anemia 04/12/2016  . GERD (gastroesophageal reflux disease) 04/12/2016  . Depression with anxiety 04/12/2016  . Irritable bowel syndrome (IBS) 04/12/2016  .  Hepatic steatosis 04/12/2016  . Multiple myeloma in remission ([place]) 04/02/2016  . Abnormal stress test 02/14/2016  . 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[place]) 07/06/2015  . Colitis  02/14/2013  . Disequilibrium 01/14/2013    Prior to Admission medications   Medication Sig Start Date End Date Taking? Authorizing Provider  acyclovir (ZOVIRAX) 200 MG capsule TAKE 2 CAPSULES(400 MG) BY MOUTH THREE TIMES DAILY 05/30/17  Yes Glean Hess, MD  allopurinol (ZYLOPRIM) 100 MG tablet TAKE 1 TABLET(100 MG) BY MOUTH DAILY as needed 07/26/17  Yes Gladstone Lighter, MD  aspirin 81 MG chewable tablet Chew 1 tablet (81 mg total) by mouth daily. 07/27/17  Yes Gladstone Lighter, MD  Biotin 1 MG CAPS Take 1 mg by mouth daily.    Yes [provider]  bisoprolol (ZEBETA) 10 MG tablet TAKE 1 TABLET(10 MG) BY MOUTH DAILY 05/30/17  Yes Glean Hess, MD  cholestyramine Lucrezia Starch) 4 g packet Take 4 g once daily. Patient taking differently: Take 4 g by mouth daily as needed. Take 4 g once daily. 06/18/16  Yes Cammie Sickle, MD  diphenoxylate-atropine (LOMOTIL) 2.5-0.025 MG tablet Take 2 tablets by mouth daily as needed for diarrhea or loose stools. 09/24/16  Yes Cammie Sickle, MD  DULoxetine (CYMBALTA) 60 MG capsule Take 1 capsule (60 mg total) by mouth daily. 07/26/17  Yes Gladstone Lighter, MD  fentaNYL (DURAGESIC - DOSED MCG/HR) 25 MCG/HR patch Place 25 mcg onto the skin every 3 (three) days. 08/12/16  Yes [provider]  fexofenadine (ALLEGRA) 180 MG tablet TAKE 1 TABLET(180 MG) BY MOUTH DAILY Patient taking differently: TAKE 1 TABLET(180 MG) BY MOUTH DAILY as needed 05/30/17  Yes Glean Hess, MD  fluticasone [facility]) 50 MCG/ACT nasal spray Place 2 sprays into both nostrils daily. Patient taking differently: Place 2 sprays into both nostrils daily as needed.  05/30/16  Yes Glean Hess, MD  furosemide (LASIX) 20 MG tablet Take 0.5 tablets (10 mg total) by mouth daily as needed. 05/30/16  Yes Glean Hess, MD  glucose blood (ONE TOUCH ULTRA TEST) test strip  09/04/15  Yes [provider]  hydrOXYzine (ATARAX/VISTARIL) 10 MG tablet  TAKE 1 TABLET(10 MG) BY MOUTH THREE TIMES DAILY AS NEEDED 05/30/17  Yes Glean Hess, MD  Multiple Vitamins-Minerals (HAIR/SKIN/NAILS/BIOTIN PO) Take 1 capsule by mouth 3 (three) times daily.   Yes [provider]  omeprazole (PRILOSEC) 40 MG capsule Take 1 capsule (40 mg total) by mouth 2 (two) times daily. Patient taking differently: Take 40 mg by mouth daily.  04/09/17  Yes Glean Hess, MD  promethazine (PHENERGAN) 25 MG tablet TAKE 1 TO 2 TABLETS BY MOUTH TWICE DAILY BEFORE MEALS AS NEEDED 08/04/17  Yes Cammie Sickle, MD  tiZANidine (ZANAFLEX) 4 MG tablet TAKE 1 TABLET BY MOUTH EVERY 8 HOURS AS NEEDED, MUSCLE RELAXER FOR BACK PAIN 07/27/17  Yes Glean Hess, MD  traZODone (DESYREL) 50 MG tablet Take 50 mg by mouth at bedtime as needed.  05/30/16  Yes [provider]  zaleplon (SONATA) 10 MG capsule TAKE 1 CAPSULE BY MOUTH EVERY DAY AT BEDTIME AS NEEDED FOR SLEEP 02/07/17  Yes Glean Hess, MD    Allergies  Allergen Reactions  . Oxycodone-Acetaminophen Anaphylaxis    Swelling and rash  . Benadryl [Diphenhydramine] Palpitations  . Morphine Itching and Rash  . Ondansetron Diarrhea    CAUSES PATIENT DIARRHEA CAUSES PATIENT DIARRHEA CAUSES PATIENT DIARRHEA CAUSES PATIENT DIARRHEA CAUSES PATIENT DIARRHEA  .  Tylenol [Acetaminophen] Itching and Rash    Past Surgical History:  Procedure Laterality Date  . ABDOMINAL HYSTERECTOMY    . 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 CARDIAC ELECTROPHYSIOLOGY MAPPING AND ABLATION    . CARPAL TUNNEL RELEASE Bilateral   . CHOLECYSTECTOMY  2008  . COLONOSCOPY WITH PROPOFOL N/A 05/07/2017   Procedure: COLONOSCOPY WITH PROPOFOL;  Surgeon: [name], MD;  Location: [facility] ENDOSCOPY;  Service: Gastroenterology;  Laterality: N/A;  . ESOPHAGOGASTRODUODENOSCOPY (EGD) WITH PROPOFOL N/A 05/07/2017   Procedure: ESOPHAGOGASTRODUODENOSCOPY (EGD) WITH PROPOFOL;  Surgeon: [name], MD;  Location: [facility] ENDOSCOPY;  Service:  Gastroenterology;  Laterality: N/A;  . FOOT SURGERY Bilateral   . INCONTINENCE SURGERY  2009  . PARTIAL HYSTERECTOMY  03/1996   fibroids  . TONSILLECTOMY  2007    Social History   Tobacco Use  . Smoking status: Former Smoker    Packs/day: 1.00    Years: 20.00    Pack years: 20.00    Types: Cigarettes    Last attempt to quit: 07/02/1991    Years since quitting: 26.1  . Smokeless tobacco: Never Used  Substance Use Topics  . Alcohol use: Yes    Alcohol/week: 1.2 oz    Types: 1 Glasses of wine, 1 Cans of beer per week    Comment: occassional  . Drug use: No     Medication list has been reviewed and updated.  PHQ 2/9 Scores 08/06/2017 08/06/2017 04/09/2017 09/02/2016  PHQ - 2 Score 0 0 1 1  PHQ- 9 Score 6 0 5 7    Physical Exam  Constitutional: She is oriented to person, place, and time. She appears well-developed. No distress.  HENT:  Head: Normocephalic and atraumatic.  Neck: Normal range of motion. Neck supple. No thyromegaly present.  Cardiovascular: Normal rate, regular rhythm and normal heart sounds.  Pulmonary/Chest: Effort normal and breath sounds normal. No respiratory distress. She has no wheezes.    Abdominal: Soft. Bowel sounds are normal.  Musculoskeletal: She exhibits no edema.       Feet:  Neurological: She is alert and oriented to person, place, and time.  Skin: Skin is warm and dry. No rash noted.  Psychiatric: She has a normal mood and affect. Her behavior is normal. Thought content normal.  Nursing note and vitals reviewed.   BP 138/80   Pulse (!) 103   Ht 5' 2"  (1.575 m)   Wt 156 lb (70.8 kg)   SpO2 98%   BMI 28.53 kg/m   Assessment and Plan: 1. Thyroid nodule - Ambulatory referral to ENT  2. Sprain of left ankle, unspecified ligament, initial encounter Continue supportive care  3. Contusion of rib on left side, initial encounter Warm compresses  4. Carotid artery stenosis, asymptomatic, bilateral Consider statin therapy - discuss next  visit   No orders of the defined types were placed in this encounter.   Partially dictated using Editor, commissioning. Any errors are unintentional.  Halina Maidens, MD [facility]  08/06/2017

## 2017-08-12 ENCOUNTER — Inpatient Hospital Stay: Payer: BLUE CROSS/BLUE SHIELD

## 2017-08-12 VITALS — BP 179/80 | HR 73 | Temp 94.2°F | Resp 20

## 2017-08-12 DIAGNOSIS — C9001 Multiple myeloma in remission: Secondary | ICD-10-CM

## 2017-08-12 LAB — MAGNESIUM: Magnesium: 1.4 mg/dL — ABNORMAL LOW (ref 1.7–2.4)

## 2017-08-12 MED ORDER — SODIUM CHLORIDE 0.9 % IV SOLN
Freq: Once | INTRAVENOUS | Status: AC
  Administered 2017-08-12: 10:00:00 via INTRAVENOUS
  Filled 2017-08-12: qty 1000

## 2017-08-12 MED ORDER — MAGNESIUM SULFATE 2 GM/50ML IV SOLN
2.0000 g | Freq: Once | INTRAVENOUS | Status: AC
  Administered 2017-08-12: 2 g via INTRAVENOUS
  Filled 2017-08-12: qty 50

## 2017-08-12 NOTE — Patient Instructions (Signed)
Magnesium Sulfate injection What is this medicine? MAGNESIUM SULFATE (mag NEE zee um SUL fate) is an electrolyte injection commonly used to treat low magnesium levels in your blood. It is also used to prevent or control seizures in women with preeclampsia or eclampsia. This medicine may be used for other purposes; ask your health care provider or pharmacist if you have questions. What should I tell my health care provider before I take this medicine? They need to know if you have any of these conditions: -heart disease -history of irregular heart beat -kidney disease -an unusual or allergic reaction to magnesium sulfate, medicines, foods, dyes, or preservatives -pregnant or trying to get pregnant -breast-feeding How should I use this medicine? This medicine is for infusion into a vein. It is given by a health care professional in a hospital or clinic setting. Talk to your pediatrician regarding the use of this medicine in children. While this drug may be prescribed for selected conditions, precautions do apply. Overdosage: If you think you have taken too much of this medicine contact a poison control center or emergency room at once. NOTE: This medicine is only for you. Do not share this medicine with others. What if I miss a dose? This does not apply. What may interact with this medicine? This medicine may interact with the following medications: -certain medicines for anxiety or sleep -certain medicines for seizures like phenobarbital -digoxin -medicines that relax muscles for surgery -narcotic medicines for pain This list may not describe all possible interactions. Give your health care provider a list of all the medicines, herbs, non-prescription drugs, or dietary supplements you use. Also tell them if you smoke, drink alcohol, or use illegal drugs. Some items may interact with your medicine. What should I watch for while using this medicine? Your condition will be monitored carefully  while you are receiving this medicine. You may need blood work done while you are receiving this medicine. What side effects may I notice from receiving this medicine? Side effects that you should report to your doctor or health care professional as soon as possible: -allergic reactions like skin rash, itching or hives, swelling of the face, lips, or tongue -facial flushing -muscle weakness -signs and symptoms of low blood pressure like dizziness; feeling faint or lightheaded, falls; unusually weak or tired -signs and symptoms of a dangerous change in heartbeat or heart rhythm like chest pain; dizziness; fast or irregular heartbeat; palpitations; breathing problems -sweating This list may not describe all possible side effects. Call your doctor for medical advice about side effects. You may report side effects to FDA at 1-800-FDA-1088. Where should I keep my medicine? This drug is given in a hospital or clinic and will not be stored at home. NOTE: This sheet is a summary. It may not cover all possible information. If you have questions about this medicine, talk to your doctor, pharmacist, or health care provider.  2018 Elsevier/Gold Standard (2016-01-03 12:31:42)  

## 2017-08-19 ENCOUNTER — Inpatient Hospital Stay: Payer: BLUE CROSS/BLUE SHIELD

## 2017-08-19 VITALS — BP 124/72 | HR 72 | Resp 20

## 2017-08-19 DIAGNOSIS — C9001 Multiple myeloma in remission: Secondary | ICD-10-CM

## 2017-08-19 LAB — MAGNESIUM: MAGNESIUM: 1.3 mg/dL — AB (ref 1.7–2.4)

## 2017-08-19 MED ORDER — SODIUM CHLORIDE 0.9 % IV SOLN
Freq: Once | INTRAVENOUS | Status: AC
  Administered 2017-08-19: 10:00:00 via INTRAVENOUS
  Filled 2017-08-19: qty 1000

## 2017-08-19 MED ORDER — MAGNESIUM SULFATE 2 GM/50ML IV SOLN
2.0000 g | Freq: Once | INTRAVENOUS | Status: AC
  Administered 2017-08-19: 2 g via INTRAVENOUS
  Filled 2017-08-19: qty 50

## 2017-08-19 NOTE — Patient Instructions (Signed)
Magnesium Sulfate injection What is this medicine? MAGNESIUM SULFATE (mag NEE zee um SUL fate) is an electrolyte injection commonly used to treat low magnesium levels in your blood. It is also used to prevent or control seizures in women with preeclampsia or eclampsia. This medicine may be used for other purposes; ask your health care provider or pharmacist if you have questions. What should I tell my health care provider before I take this medicine? They need to know if you have any of these conditions: -heart disease -history of irregular heart beat -kidney disease -an unusual or allergic reaction to magnesium sulfate, medicines, foods, dyes, or preservatives -pregnant or trying to get pregnant -breast-feeding How should I use this medicine? This medicine is for infusion into a vein. It is given by a health care professional in a hospital or clinic setting. Talk to your pediatrician regarding the use of this medicine in children. While this drug may be prescribed for selected conditions, precautions do apply. Overdosage: If you think you have taken too much of this medicine contact a poison control center or emergency room at once. NOTE: This medicine is only for you. Do not share this medicine with others. What if I miss a dose? This does not apply. What may interact with this medicine? This medicine may interact with the following medications: -certain medicines for anxiety or sleep -certain medicines for seizures like phenobarbital -digoxin -medicines that relax muscles for surgery -narcotic medicines for pain This list may not describe all possible interactions. Give your health care provider a list of all the medicines, herbs, non-prescription drugs, or dietary supplements you use. Also tell them if you smoke, drink alcohol, or use illegal drugs. Some items may interact with your medicine. What should I watch for while using this medicine? Your condition will be monitored carefully  while you are receiving this medicine. You may need blood work done while you are receiving this medicine. What side effects may I notice from receiving this medicine? Side effects that you should report to your doctor or health care professional as soon as possible: -allergic reactions like skin rash, itching or hives, swelling of the face, lips, or tongue -facial flushing -muscle weakness -signs and symptoms of low blood pressure like dizziness; feeling faint or lightheaded, falls; unusually weak or tired -signs and symptoms of a dangerous change in heartbeat or heart rhythm like chest pain; dizziness; fast or irregular heartbeat; palpitations; breathing problems -sweating This list may not describe all possible side effects. Call your doctor for medical advice about side effects. You may report side effects to FDA at 1-800-FDA-1088. Where should I keep my medicine? This drug is given in a hospital or clinic and will not be stored at home. NOTE: This sheet is a summary. It may not cover all possible information. If you have questions about this medicine, talk to your doctor, pharmacist, or health care provider.  2018 Elsevier/Gold Standard (2016-01-03 12:31:42)  

## 2017-08-20 ENCOUNTER — Other Ambulatory Visit: Payer: Self-pay | Admitting: Otolaryngology

## 2017-08-20 DIAGNOSIS — E041 Nontoxic single thyroid nodule: Secondary | ICD-10-CM

## 2017-08-21 DIAGNOSIS — I10 Essential (primary) hypertension: Secondary | ICD-10-CM | POA: Insufficient documentation

## 2017-08-21 DIAGNOSIS — I428 Other cardiomyopathies: Secondary | ICD-10-CM | POA: Insufficient documentation

## 2017-08-21 DIAGNOSIS — I6523 Occlusion and stenosis of bilateral carotid arteries: Secondary | ICD-10-CM

## 2017-08-21 DIAGNOSIS — I429 Cardiomyopathy, unspecified: Secondary | ICD-10-CM | POA: Insufficient documentation

## 2017-08-21 HISTORY — DX: Cardiomyopathy, unspecified: I42.9

## 2017-08-21 HISTORY — DX: Other cardiomyopathies: I42.8

## 2017-08-25 ENCOUNTER — Other Ambulatory Visit: Payer: Self-pay | Admitting: Internal Medicine

## 2017-08-26 ENCOUNTER — Inpatient Hospital Stay: Payer: BLUE CROSS/BLUE SHIELD

## 2017-08-26 ENCOUNTER — Ambulatory Visit
Admission: RE | Admit: 2017-08-26 | Discharge: 2017-08-26 | Disposition: A | Discharge status: Home / Self Care | Payer: BLUE CROSS/BLUE SHIELD | Source: Ambulatory Visit | Attending: Otolaryngology | Admitting: Otolaryngology

## 2017-08-26 VITALS — BP 124/66 | HR 73 | Temp 97.4°F | Resp 20

## 2017-08-26 DIAGNOSIS — C9001 Multiple myeloma in remission: Secondary | ICD-10-CM

## 2017-08-26 DIAGNOSIS — E041 Nontoxic single thyroid nodule: Secondary | ICD-10-CM | POA: Diagnosis present

## 2017-08-26 LAB — MAGNESIUM: Magnesium: 1.4 mg/dL — ABNORMAL LOW (ref 1.7–2.4)

## 2017-08-26 MED ORDER — MAGNESIUM SULFATE 2 GM/50ML IV SOLN
2.0000 g | Freq: Once | INTRAVENOUS | Status: AC
  Administered 2017-08-26: 2 g via INTRAVENOUS
  Filled 2017-08-26: qty 50

## 2017-08-26 MED ORDER — SODIUM CHLORIDE 0.9 % IV SOLN
Freq: Once | INTRAVENOUS | Status: AC
  Administered 2017-08-26: 09:00:00 via INTRAVENOUS
  Filled 2017-08-26: qty 1000

## 2017-08-26 NOTE — Patient Instructions (Signed)
Magnesium Sulfate injection What is this medicine? MAGNESIUM SULFATE (mag NEE zee um SUL fate) is an electrolyte injection commonly used to treat low magnesium levels in your blood. It is also used to prevent or control seizures in women with preeclampsia or eclampsia. This medicine may be used for other purposes; ask your health care provider or pharmacist if you have questions. What should I tell my health care provider before I take this medicine? They need to know if you have any of these conditions: -heart disease -history of irregular heart beat -kidney disease -an unusual or allergic reaction to magnesium sulfate, medicines, foods, dyes, or preservatives -pregnant or trying to get pregnant -breast-feeding How should I use this medicine? This medicine is for infusion into a vein. It is given by a health care professional in a hospital or clinic setting. Talk to your pediatrician regarding the use of this medicine in children. While this drug may be prescribed for selected conditions, precautions do apply. Overdosage: If you think you have taken too much of this medicine contact a poison control center or emergency room at once. NOTE: This medicine is only for you. Do not share this medicine with others. What if I miss a dose? This does not apply. What may interact with this medicine? This medicine may interact with the following medications: -certain medicines for anxiety or sleep -certain medicines for seizures like phenobarbital -digoxin -medicines that relax muscles for surgery -narcotic medicines for pain This list may not describe all possible interactions. Give your health care provider a list of all the medicines, herbs, non-prescription drugs, or dietary supplements you use. Also tell them if you smoke, drink alcohol, or use illegal drugs. Some items may interact with your medicine. What should I watch for while using this medicine? Your condition will be monitored carefully  while you are receiving this medicine. You may need blood work done while you are receiving this medicine. What side effects may I notice from receiving this medicine? Side effects that you should report to your doctor or health care professional as soon as possible: -allergic reactions like skin rash, itching or hives, swelling of the face, lips, or tongue -facial flushing -muscle weakness -signs and symptoms of low blood pressure like dizziness; feeling faint or lightheaded, falls; unusually weak or tired -signs and symptoms of a dangerous change in heartbeat or heart rhythm like chest pain; dizziness; fast or irregular heartbeat; palpitations; breathing problems -sweating This list may not describe all possible side effects. Call your doctor for medical advice about side effects. You may report side effects to FDA at 1-800-FDA-1088. Where should I keep my medicine? This drug is given in a hospital or clinic and will not be stored at home. NOTE: This sheet is a summary. It may not cover all possible information. If you have questions about this medicine, talk to your doctor, pharmacist, or health care provider.  2018 Elsevier/Gold Standard (2016-01-03 12:31:42)  

## 2017-08-29 ENCOUNTER — Other Ambulatory Visit: Payer: Self-pay | Admitting: Otolaryngology

## 2017-08-29 DIAGNOSIS — E041 Nontoxic single thyroid nodule: Secondary | ICD-10-CM

## 2017-09-02 ENCOUNTER — Other Ambulatory Visit: Payer: Self-pay | Admitting: Internal Medicine

## 2017-09-02 ENCOUNTER — Other Ambulatory Visit: Payer: Self-pay

## 2017-09-02 ENCOUNTER — Inpatient Hospital Stay: Payer: BLUE CROSS/BLUE SHIELD

## 2017-09-02 ENCOUNTER — Inpatient Hospital Stay: Payer: BLUE CROSS/BLUE SHIELD | Attending: Internal Medicine | Admitting: Internal Medicine

## 2017-09-02 VITALS — BP 132/75 | HR 67 | Temp 97.4°F | Resp 18 | Wt 158.4 lb

## 2017-09-02 DIAGNOSIS — C9001 Multiple myeloma in remission: Secondary | ICD-10-CM | POA: Diagnosis present

## 2017-09-02 DIAGNOSIS — Z79899 Other long term (current) drug therapy: Secondary | ICD-10-CM | POA: Diagnosis not present

## 2017-09-02 DIAGNOSIS — G8929 Other chronic pain: Secondary | ICD-10-CM | POA: Insufficient documentation

## 2017-09-02 DIAGNOSIS — R110 Nausea: Secondary | ICD-10-CM

## 2017-09-02 DIAGNOSIS — R197 Diarrhea, unspecified: Secondary | ICD-10-CM | POA: Diagnosis not present

## 2017-09-02 DIAGNOSIS — M549 Dorsalgia, unspecified: Secondary | ICD-10-CM

## 2017-09-02 DIAGNOSIS — R530 Neoplastic (malignant) related fatigue: Secondary | ICD-10-CM | POA: Diagnosis not present

## 2017-09-02 DIAGNOSIS — Z87891 Personal history of nicotine dependence: Secondary | ICD-10-CM | POA: Insufficient documentation

## 2017-09-02 LAB — COMPREHENSIVE METABOLIC PANEL
ALT: 34 U/L (ref 14–54)
AST: 33 U/L (ref 15–41)
Albumin: 4.9 g/dL (ref 3.5–5.0)
Alkaline Phosphatase: 183 U/L — ABNORMAL HIGH (ref 38–126)
Anion gap: 9 (ref 5–15)
BILIRUBIN TOTAL: 0.4 mg/dL (ref 0.3–1.2)
BUN: 37 mg/dL — AB (ref 6–20)
CALCIUM: 9.2 mg/dL (ref 8.9–10.3)
CO2: 25 mmol/L (ref 22–32)
CREATININE: 1.21 mg/dL — AB (ref 0.44–1.00)
Chloride: 102 mmol/L (ref 101–111)
GFR calc Af Amer: 55 mL/min — ABNORMAL LOW (ref >60)
GFR, EST NON AFRICAN AMERICAN: 48 mL/min — AB (ref >60)
Glucose, Bld: 125 mg/dL — ABNORMAL HIGH (ref 65–99)
Potassium: 4.4 mmol/L (ref 3.5–5.1)
Sodium: 136 mmol/L (ref 135–145)
TOTAL PROTEIN: 8.1 g/dL (ref 6.5–8.1)

## 2017-09-02 LAB — CBC WITH DIFFERENTIAL/PLATELET
BASOS ABS: 0 10*3/uL (ref 0–0.1)
Basophils Relative: 0 %
EOS PCT: 2 %
Eosinophils Absolute: 0.1 10*3/uL (ref 0–0.7)
HCT: 37.6 % (ref 35.0–47.0)
Hemoglobin: 13 g/dL (ref 12.0–16.0)
Lymphocytes Relative: 20 %
Lymphs Abs: 1.2 10*3/uL (ref 1.0–3.6)
MCH: 30.8 pg (ref 26.0–34.0)
MCHC: 34.5 g/dL (ref 32.0–36.0)
MCV: 89.3 fL (ref 80.0–100.0)
Monocytes Absolute: 0.5 10*3/uL (ref 0.2–0.9)
Monocytes Relative: 9 %
Neutro Abs: 4.1 10*3/uL (ref 1.4–6.5)
Neutrophils Relative %: 69 %
PLATELETS: 143 10*3/uL — AB (ref 150–440)
RBC: 4.21 MIL/uL (ref 3.80–5.20)
RDW: 14 % (ref 11.5–14.5)
WBC: 5.9 10*3/uL (ref 3.6–11.0)

## 2017-09-02 LAB — MAGNESIUM: Magnesium: 1.5 mg/dL — ABNORMAL LOW (ref 1.7–2.4)

## 2017-09-02 MED ORDER — MAGNESIUM SULFATE 2 GM/50ML IV SOLN
2.0000 g | Freq: Once | INTRAVENOUS | Status: AC
  Administered 2017-09-02: 2 g via INTRAVENOUS
  Filled 2017-09-02: qty 50

## 2017-09-02 MED ORDER — SODIUM CHLORIDE 0.9 % IV SOLN
Freq: Once | INTRAVENOUS | Status: AC
  Administered 2017-09-02: 10:00:00 via INTRAVENOUS
  Filled 2017-09-02: qty 1000

## 2017-09-02 NOTE — Telephone Encounter (Signed)
Patient is due to come in April 12

## 2017-09-02 NOTE — Assessment & Plan Note (Addendum)
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  HELD maintenance revlimid since July 2018 sec to ONJ  # jan 2019- serum M protein absent/K-L light chains- normal. Clinically no evidence of progression at this time. Continue to HOLD Revlimid for now. Await myeloma labs from today.   # ONJ- AUG 2018-[oral surgery at [facility]. I would recommend discontinuation of Zometa at this time. Patient takes calcium and vitamin d currently; okay with dental extraction; but check with Dr.Powers prior.   # Nausea chronic on Phenergan 25 mg prn.  Reviewed recent EGD colonoscopy.  # Chronic diarrhea alternating with constipation-  ? IBD;  Colestyramine prn.   #  Hypomagnesemia- Mg 1.5 today. IV mag today and continue with weekly lab monitoring and replenishment as needed.    # Chronic back pain- pain management follow-up; on fentanyl  25 mc/ [Dr.James; [facility]; [place]]; no BTP.  # follow up in 2 months/myeloma panel; k-l light chains; weekly mag/infusion.

## 2017-09-02 NOTE — Progress Notes (Signed)
[facility] NOTE  Patient Care Team: Glean Hess, MD as PCP - General (Internal Medicine) Cammie Sickle, MD as Consulting Physician (Oncology) Jodell Cipro, MD as Referring Physician (Pain Medicine) Corey Skains, MD as Consulting Physician (Cardiology)  CHIEF COMPLAINTS/PURPOSE OF CONSULTATION:  Multiple myeloma  # MULTIPLE MYELOMA-   Oncology History   # June 2017- [facility] MULTIPLE MYELOMA [BMBx- 80% plasma cells; [facility]]; Lamda light chain 1340; s/p RVD   # 14th DEC 2016- Auto-Stem cell transplant [[facility];Dr.Hertzig]; rev [dose reduced sec to [place]  # Maintenance Revlimid- 52m 3 w-ON & 1 week OFF; HELD July 2018- sec to ONJ  # Bone lesions-numerous ~581mlytic lesions in Thoracic spine- on Zometa  # 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  # "Colitis" s/p multiple EGD/Colonoscopies; EGD- patchy inflammation/colo- Nov 2018 [Dr.Anna]     [facility]    HISTORY OF PRESENTING ILLNESS:  [name].o.  female of her history of multiple myeloma diagnosed June 2016 currently on Surveillance [ since July 2018 maintenance Revlimid on hold]  Is here for follow-up.  Patient states that she was recently evaluated in the hospital after she passed out/had paresthesias.  MRI did not show any acute process.  Patient had a neurology evaluation.  Patient thought to have peripheral/Raynard-like paresthesia.  Patient states that she had a "TIA"  Patient continues to get weekly IV magnesium-seems to be helping her leg cramps.  Patient continues to have mild to moderate fatigue.  She denies any ongoing diarrhea constipation.  Currently patient is more constipated than diarrhea.  Patient takes chronic pain medication; which  is keeping her pain stable.  ROS: A complete 10 point review of system is done which is negative except mentioned above in history of present illness  MEDICAL HISTORY:  Past Medical History:  Diagnosis Date  . Anemia   . Anxiety   . Arthritis   . Bicuspid aortic valve   . CHF (congestive heart failure) ([facility]  . CKD (chronic kidney disease) stage 3, GFR 30-59 ml/min (HCC)   . Depression   . Diabetes mellitus ([facility]  . Dizziness   . Fatty liver   . Frequent falls   . GERD (gastroesophageal reflux disease)   . Gout   . Heart murmur   . History of blood transfusion   . [facility] of [facility] marrow transplant ([facility]  . History of uterine fibroid   . Hypertension   . Hypomagnesemia   . Multiple myeloma ([facility]  . Personal history of chemotherapy   . Renal cyst     SURGICAL HISTORY: Past Surgical History:  Procedure Laterality Date  . ABDOMINAL HYSTERECTOMY    . 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 CARDIAC ELECTROPHYSIOLOGY MAPPING AND ABLATION    . CARPAL TUNNEL RELEASE Bilateral   . CHOLECYSTECTOMY  2008  . COLONOSCOPY WITH PROPOFOL N/A 05/07/2017   Procedure: COLONOSCOPY WITH PROPOFOL;  Surgeon: AnJonathon BellowsMD;  [facility];  Service: Gastroenterology;  Laterality: N/A;  . ESOPHAGOGASTRODUODENOSCOPY (EGD) WITH PROPOFOL N/A 05/07/2017   Procedure: ESOPHAGOGASTRODUODENOSCOPY (EGD) WITH PROPOFOL;  Surgeon: AnJonathon BellowsMD;  [facility];  Service: Gastroenterology;  Laterality: N/A;  . FOOT SURGERY Bilateral   . INCONTINENCE SURGERY  2009  . PARTIAL HYSTERECTOMY  03/1996   fibroids  . TONSILLECTOMY  2007   SOCIAL HISTORY: moved from [place]; widowed in June 2017; quit smoking 93; occasional alcohol; lives [place] with step sister.  Social History   Socioeconomic History  . Marital status: Widowed    Spouse name: Not on file  . Number of children: Not on file  . Years of education: Not on file  . Highest education level: Not on file  Social Needs   . Financial resource strain: Not on file  . Food insecurity - worry: Not on file  . Food insecurity - inability: Not on file  . Transportation needs - medical: Not on file  . Transportation needs - non-medical: Not on file  Occupational History  . Occupation: Disabled  Tobacco Use  . Smoking status: Former Smoker    Packs/day: 1.00    Years: 20.00    Pack years: 20.00    Types: Cigarettes    Last attempt to quit: 07/02/1991    Years since quitting: 26.1  . Smokeless tobacco: Never Used  Substance and Sexual Activity  . Alcohol use: Yes    Alcohol/week: 1.2 oz    Types: 1 Glasses of wine, 1 Cans of beer per week    Comment: occassional  . Drug use: No  . Sexual activity: No  Other Topics Concern  . Not on file  Social History Narrative  . Not on file    FAMILY HISTORY: Family History  Problem Relation Age of Onset  . Colon cancer Father   . Renal Disease Father   . Diabetes Mellitus II Father   . Melanoma Paternal Grandmother   . Breast cancer Maternal Aunt 19  . Anemia Mother   . Heart disease Mother   . Heart failure Mother   . Renal Disease Mother   . Congestive Heart Failure Mother   . Heart disease Maternal Uncle   . Throat cancer Maternal Uncle   . Lung cancer Maternal Uncle   . Liver disease Maternal Uncle   . Heart failure Maternal Uncle   . Hearing loss Son 18       Suicide     ALLERGIES:  is allergic to oxycodone-acetaminophen; benadryl [diphenhydramine]; morphine; ondansetron; and tylenol [acetaminophen].  MEDICATIONS:  Current Outpatient Medications  Medication Sig Dispense Refill  . acyclovir (ZOVIRAX) 200 MG capsule TAKE 2 CAPSULES(400 MG) BY MOUTH THREE TIMES DAILY 180 capsule 1  . allopurinol (ZYLOPRIM) 100 MG tablet TAKE 1 TABLET(100 MG) BY MOUTH DAILY as needed 30 tablet 5  . aspirin 81 MG chewable tablet Chew 1 tablet (81 mg total) by mouth daily. 30 tablet 0  . Biotin 1 MG CAPS Take 1 mg by mouth daily.     . bisoprolol (ZEBETA) 10 MG  tablet TAKE 1 TABLET(10 MG) BY MOUTH DAILY 30 tablet 5  . DULoxetine (CYMBALTA) 60 MG capsule Take 1 capsule (60 mg total) by mouth daily. 60 capsule 1  . fentaNYL (DURAGESIC - DOSED MCG/HR) 25 MCG/HR patch Place 25 mcg onto the skin every 3 (three) days.    . fexofenadine (ALLEGRA) 180 MG tablet TAKE 1 TABLET(180 MG) BY MOUTH DAILY (Patient taking differently: TAKE 1 TABLET(180 MG) BY MOUTH DAILY as needed) 30 tablet 5  . glucose blood (ONE TOUCH ULTRA TEST) test strip     . lisinopril (PRINIVIL,ZESTRIL) 10 MG tablet Take 10 mg by mouth daily.    . Menaquinone-7 (VITAMIN K2) 100 MCG CAPS Take by mouth.    Marland Kitchen  Multiple Minerals-Vitamins (CALCIUM-MAGNESIUM-ZINC-D3) TABS Take by mouth.    . Multiple Vitamins-Minerals (HAIR/SKIN/NAILS/BIOTIN PO) Take 1 capsule by mouth 3 (three) times daily.    . NON FORMULARY     . Omega 3-6-9 Fatty Acids (OMEGA 3-6-9 COMPLEX) CAPS Take by mouth.    Marland Kitchen omeprazole (PRILOSEC) 40 MG capsule Take 1 capsule (40 mg total) by mouth 2 (two) times daily. (Patient taking differently: Take 40 mg by mouth daily. ) 60 capsule 5  . promethazine (PHENERGAN) 25 MG tablet TAKE 1 TO 2 TABLETS BY MOUTH TWICE DAILY BEFORE MEALS AS NEEDED 60 tablet 0  . tiZANidine (ZANAFLEX) 4 MG tablet TAKE 1 TABLET BY MOUTH EVERY 8 HOURS AS NEEDED, MUSCLE RELAXER FOR BACK PAIN 90 tablet 0  . Valerian Root 500 MG CAPS Take by mouth.    . cholestyramine (QUESTRAN) 4 g packet Take 4 g once daily. (Patient not taking: Reported on 09/02/2017) 60 each 3  . diphenoxylate-atropine (LOMOTIL) 2.5-0.025 MG tablet Take 2 tablets by mouth daily as needed for diarrhea or loose stools. (Patient not taking: Reported on 09/02/2017) 30 tablet 2  . fluticasone (FLONASE) 50 MCG/ACT nasal spray Place 2 sprays into both nostrils daily. (Patient not taking: Reported on 09/02/2017) 16 g 0  . furosemide (LASIX) 20 MG tablet Take 0.5 tablets (10 mg total) by mouth daily as needed. (Patient not taking: Reported on 09/02/2017) 30 tablet 0   . hydrOXYzine (ATARAX/VISTARIL) 10 MG tablet TAKE 1 TABLET(10 MG) BY MOUTH THREE TIMES DAILY AS NEEDED (Patient not taking: Reported on 09/02/2017) 90 tablet 0  . traZODone (DESYREL) 50 MG tablet Take 50 mg by mouth at bedtime as needed.     . zaleplon (SONATA) 10 MG capsule TAKE ONE CAPSULE BY MOUTH EVERY DAY AT BEDTIME AS NEEDED FOR SLEEP 30 capsule 5   No current facility-administered medications for this visit.     PHYSICAL EXAMINATION: ECOG PERFORMANCE STATUS: 1 - Symptomatic but completely ambulatory  Vitals:   09/02/17 0914  BP: 132/75  Pulse: 67  Resp: 18  Temp: (!) 97.4 F (36.3 C)   Filed Weights   09/02/17 0914  Weight: 158 lb 6.4 oz (71.9 kg)    GENERAL: Well-nourished well-developed; Alert, no distress and comfortable.  EYES: no pallor or icterus OROPHARYNX: no thrush;  NECK: supple, no masses felt LYMPH:  no palpable lymphadenopathy in the cervical, axillary or inguinal regions LUNGS: clear to auscultation and  No wheeze or crackles HEART/CVS: regular rate & rhythm and no murmurs; No lower extremity edema ABDOMEN: abdomen soft, non-tender and normal bowel sounds Musculoskeletal:no cyanosis of digits and no clubbing  PSYCH: alert & oriented x 3 with fluent speech NEURO: no focal motor/sensory deficits SKIN:  no rashes or significant lesions  LABORATORY DATA:  I have reviewed the data as listed Lab Results  Component Value Date   WBC 5.9 09/02/2017   HGB 13.0 09/02/2017   HCT 37.6 09/02/2017   MCV 89.3 09/02/2017   PLT 143 (L) 09/02/2017   Recent Labs    07/03/17 0810 07/25/17 0948 07/26/17 0446 09/02/17 0843  NA 137 137 137 136  K 4.4 4.3 4.5 4.4  CL 103 103 104 102  CO2 26 23 25 25   GLUCOSE 119* 126* 134* 125*  BUN 29* 30* 27* 37*  CREATININE 1.18* 1.23* 1.02* 1.21*  CALCIUM 9.3 10.3 9.4 9.2  GFRNONAA 49* 47* 59* 48*  GFRAA 57* 54* >60 55*  PROT 7.6 8.1  --  8.1  ALBUMIN 4.4 4.8  --  4.9  AST 27 32  --  33  ALT 35 35  --  34  ALKPHOS 110  104  --  183*  BILITOT 0.4 0.8  --  0.4    Results for THOMASENA, VANDENHEUVEL (MRN [id number]) as of 05/13/2017 09:40  Ref. Range 06/18/2016 11:15 09/24/2016 10:06 12/24/2016 09:37 03/18/2017 08:20 05/06/2017 08:42  Kappa free light chain Latest Ref Range: 3.3 - 19.4 mg/L 17.1 12.5 15.1 16.6 15.1  Lamda free light chains Latest Ref Range: 5.7 - 26.3 mg/L 15.8 14.6 19.3 20.0 21.0  Kappa, lamda light chain ratio Latest Ref Range: 0.26 - 1.65  1.08 0.86 0.78 0.83 0.72  M Protein SerPl Elph-Mcnc Latest Ref Range: Not Observed g/dL Not Observed Not Observed Not Observed Not Observed Not Observed      RADIOGRAPHIC STUDIES: I have personally reviewed the radiological images as listed and agreed with the findings in the report. US Thyroid  Result Date: 08/26/2017 CLINICAL DATA:  Right thyroid lesion noted on carotid ultrasound EXAM: THYROID ULTRASOUND TECHNIQUE: Ultrasound examination of the thyroid gland and adjacent soft tissues was performed. COMPARISON:  07/25/2017 FINDINGS: Parenchymal Echotexture: Mildly heterogenous Isthmus: 0.3 cm thickness Right lobe:  4.1 x 1.9 x 1.6 cm Left lobe: 4.4 x 1.4 x 1.4 cm _________________________________________________________ Estimated total number of nodules >/= 1 cm: 1 Number of spongiform nodules >/=  2 cm not described below (TR1): 0 Number of mixed cystic and solid nodules >/= 1.5 cm not described below (TR2): 0 _________________________________________________________ Nodule # 1: Location: Right; Mid Maximum size: 1.6 cm; Other 2 dimensions: 1.2 x 0.9 cm Composition: solid/almost completely solid (2) Echogenicity: isoechoic (1) Shape: not taller-than-wide (0) Margins: ill-defined (0) Echogenic foci: punctate echogenic foci (3) ACR TI-RADS total points: 6. ACR TI-RADS risk category: TR4 (4-6 points). ACR TI-RADS recommendations: **Given size (>/= 1.5 cm) and appearance, fine needle aspiration of this moderately suspicious nodule should be considered based on TI-RADS  criteria. _________________________________________________________ 0.9 cm mixed cystic/solid nodule, inferior right Multiple additional subcentimeter complex cysts in bilateral both lobes. IMPRESSION: 1. Normal-sized thyroid with small cysts and nodules. 2. Recommend FNA biopsy of 1.6 cm moderately suspicious mid right nodule. The above is in keeping with the ACR TI-RADS recommendations - J Am Coll Radiol 2017;14:587-595. Electronically Signed   By: Lucrezia Europe M.D.   On: 08/26/2017 16:50    ASSESSMENT & PLAN:   Multiple myeloma in remission ([place]) Multiple myeloma IgA lambda status post autologous stem cell transplant in December 2016 [[facility]].  HELD maintenance revlimid since July 2018 sec to ONJ  # jan 2019- serum M protein absent/K-L light chains- normal. Clinically no evidence of progression at this time. Continue to HOLD Revlimid for now. Await myeloma labs from today.   # ONJ- AUG 2018-[oral surgery at [facility]. I would recommend discontinuation of Zometa at this time. Patient takes calcium and vitamin d currently; okay with dental extraction; but check with Dr.Powers prior.   # Nausea chronic on Phenergan 25 mg prn.  Reviewed recent EGD colonoscopy.  # Chronic diarrhea alternating with constipation-  ? IBD;  Colestyramine prn.   #  Hypomagnesemia- Mg 1.5 today. IV mag today and continue with weekly lab monitoring and replenishment as needed.    # Chronic back pain- pain management follow-up; on fentanyl  25 mc/ [Dr.James; [facility]; [place]]; no BTP.  # follow up in 2 months/myeloma panel; k-l light chains; weekly mag/infusion.    Cammie Sickle, MD 09/02/2017 3:21 PM

## 2017-09-02 NOTE — Progress Notes (Signed)
Here for follow up . Started multiple vitamins- see med sheet. In pt in January -saw Dr Nehemiah Massed -and syncope dx- lisinopril started.  Vertigo intermittent per pt. Stays nausea ongoing without vomiting.

## 2017-09-03 LAB — KAPPA/LAMBDA LIGHT CHAINS
KAPPA FREE LGHT CHN: 24.1 mg/L — AB (ref 3.3–19.4)
Kappa, lambda light chain ratio: 0.78 (ref 0.26–1.65)
Lambda free light chains: 30.8 mg/L — ABNORMAL HIGH (ref 5.7–26.3)

## 2017-09-04 LAB — MULTIPLE MYELOMA PANEL, SERUM
ALBUMIN SERPL ELPH-MCNC: 4.3 g/dL (ref 2.9–4.4)
ALPHA 1: 0.3 g/dL (ref 0.0–0.4)
Albumin/Glob SerPl: 1.5 (ref 0.7–1.7)
Alpha2 Glob SerPl Elph-Mcnc: 0.9 g/dL (ref 0.4–1.0)
B-Globulin SerPl Elph-Mcnc: 1.1 g/dL (ref 0.7–1.3)
Gamma Glob SerPl Elph-Mcnc: 0.7 g/dL (ref 0.4–1.8)
Globulin, Total: 2.9 g/dL (ref 2.2–3.9)
IGA: 161 mg/dL (ref 87–352)
IGM (IMMUNOGLOBULIN M), SRM: 48 mg/dL (ref 26–217)
IgG (Immunoglobin G), Serum: 736 mg/dL (ref 700–1600)
M Protein SerPl Elph-Mcnc: 0.2 g/dL — ABNORMAL HIGH
TOTAL PROTEIN ELP: 7.2 g/dL (ref 6.0–8.5)

## 2017-09-05 ENCOUNTER — Ambulatory Visit
Admission: RE | Admit: 2017-09-05 | Discharge: 2017-09-05 | Disposition: A | Discharge status: Home / Self Care | Payer: BLUE CROSS/BLUE SHIELD | Source: Ambulatory Visit | Attending: Otolaryngology | Admitting: Otolaryngology

## 2017-09-05 DIAGNOSIS — E041 Nontoxic single thyroid nodule: Secondary | ICD-10-CM | POA: Insufficient documentation

## 2017-09-05 NOTE — Procedures (Signed)
Interventional Radiology Procedure Note  Procedure: US guided biopsy of right thyroid nodule, with AFIRMA.  1.6KO  Complications: None Recommendations:  - Ok to shower tomorrow - Do not submerge for 7 days - Routine care   Signed,  Dulcy Fanny. Earleen Newport, DO

## 2017-09-09 ENCOUNTER — Inpatient Hospital Stay: Payer: BLUE CROSS/BLUE SHIELD

## 2017-09-09 DIAGNOSIS — C9001 Multiple myeloma in remission: Secondary | ICD-10-CM

## 2017-09-09 LAB — MAGNESIUM: Magnesium: 1.3 mg/dL — ABNORMAL LOW (ref 1.7–2.4)

## 2017-09-09 MED ORDER — SODIUM CHLORIDE 0.9 % IV SOLN
Freq: Once | INTRAVENOUS | Status: AC
  Administered 2017-09-09: 09:00:00 via INTRAVENOUS
  Filled 2017-09-09: qty 1000

## 2017-09-09 MED ORDER — MAGNESIUM SULFATE 2 GM/50ML IV SOLN
2.0000 g | Freq: Once | INTRAVENOUS | Status: AC
  Administered 2017-09-09: 2 g via INTRAVENOUS
  Filled 2017-09-09: qty 50

## 2017-09-09 NOTE — Patient Instructions (Signed)
Magnesium Sulfate injection What is this medicine? MAGNESIUM SULFATE (mag NEE zee um SUL fate) is an electrolyte injection commonly used to treat low magnesium levels in your blood. It is also used to prevent or control seizures in women with preeclampsia or eclampsia. This medicine may be used for other purposes; ask your health care provider or pharmacist if you have questions. What should I tell my health care provider before I take this medicine? They need to know if you have any of these conditions: -heart disease -history of irregular heart beat -kidney disease -an unusual or allergic reaction to magnesium sulfate, medicines, foods, dyes, or preservatives -pregnant or trying to get pregnant -breast-feeding How should I use this medicine? This medicine is for infusion into a vein. It is given by a health care professional in a hospital or clinic setting. Talk to your pediatrician regarding the use of this medicine in children. While this drug may be prescribed for selected conditions, precautions do apply. Overdosage: If you think you have taken too much of this medicine contact a poison control center or emergency room at once. NOTE: This medicine is only for you. Do not share this medicine with others. What if I miss a dose? This does not apply. What may interact with this medicine? This medicine may interact with the following medications: -certain medicines for anxiety or sleep -certain medicines for seizures like phenobarbital -digoxin -medicines that relax muscles for surgery -narcotic medicines for pain This list may not describe all possible interactions. Give your health care provider a list of all the medicines, herbs, non-prescription drugs, or dietary supplements you use. Also tell them if you smoke, drink alcohol, or use illegal drugs. Some items may interact with your medicine. What should I watch for while using this medicine? Your condition will be monitored carefully  while you are receiving this medicine. You may need blood work done while you are receiving this medicine. What side effects may I notice from receiving this medicine? Side effects that you should report to your doctor or health care professional as soon as possible: -allergic reactions like skin rash, itching or hives, swelling of the face, lips, or tongue -facial flushing -muscle weakness -signs and symptoms of low blood pressure like dizziness; feeling faint or lightheaded, falls; unusually weak or tired -signs and symptoms of a dangerous change in heartbeat or heart rhythm like chest pain; dizziness; fast or irregular heartbeat; palpitations; breathing problems -sweating This list may not describe all possible side effects. Call your doctor for medical advice about side effects. You may report side effects to FDA at 1-800-FDA-1088. Where should I keep my medicine? This drug is given in a hospital or clinic and will not be stored at home. NOTE: This sheet is a summary. It may not cover all possible information. If you have questions about this medicine, talk to your doctor, pharmacist, or health care provider.  2018 Elsevier/Gold Standard (2016-01-03 12:31:42)  

## 2017-09-11 LAB — CYTOLOGY - NON PAP

## 2017-09-16 ENCOUNTER — Inpatient Hospital Stay: Payer: BLUE CROSS/BLUE SHIELD

## 2017-09-16 ENCOUNTER — Encounter: Payer: Self-pay | Admitting: Family Medicine

## 2017-09-16 ENCOUNTER — Ambulatory Visit: Payer: BLUE CROSS/BLUE SHIELD | Admitting: Family Medicine

## 2017-09-16 VITALS — BP 120/80 | HR 71 | Temp 99.5°F | Resp 16 | Ht 62.0 in | Wt 155.0 lb

## 2017-09-16 DIAGNOSIS — Z23 Encounter for immunization: Secondary | ICD-10-CM

## 2017-09-16 DIAGNOSIS — J020 Streptococcal pharyngitis: Secondary | ICD-10-CM | POA: Diagnosis not present

## 2017-09-16 DIAGNOSIS — J029 Acute pharyngitis, unspecified: Secondary | ICD-10-CM | POA: Diagnosis not present

## 2017-09-16 LAB — POCT RAPID STREP A (OFFICE): Rapid Strep A Screen: POSITIVE — AB

## 2017-09-16 MED ORDER — ZOSTER VAC RECOMB ADJUVANTED 50 MCG/0.5ML IM SUSR
0.5000 mL | Freq: Once | INTRAMUSCULAR | 1 refills | Status: AC
Start: 2017-09-16 — End: 2017-09-16

## 2017-09-16 MED ORDER — FLUCONAZOLE 150 MG PO TABS: 150.0000 mg | ORAL_TABLET | Freq: Once | ORAL | 0 refills | Status: AC

## 2017-09-16 MED ORDER — AMOXICILLIN 500 MG PO CAPS
500.0000 mg | ORAL_CAPSULE | Freq: Two times a day (BID) | ORAL | 0 refills | Status: DC
Start: 2017-09-16 — End: 2017-10-10

## 2017-09-16 NOTE — Progress Notes (Signed)
Date:  09/16/2017   Name:  Sarah [name]   DOB:  [birth year]   MRN:  [id number]  PCP:  Glean Hess, MD    Chief Complaint: Sore Throat (Since Thursday has had sore throat and started fever 100-101 yesterday.Marland KitchenMarland KitchenMarland KitchenEars hurt and was exposed last week to strep watching kids. She gets strep easy. )   History of Present Illness:  This is a 62 y.o. female seen for same day visit. Sore throat, fever, sl rhinorrhea, mild NP cough past 3 days, exposed to strep last week, gets easily, last episode 6 years ago. Hx multiple myeloma in remission. Requests Diflucan with abx and rx for Shingrix, ok with oncologist.  Review of Systems:  Review of Systems  HENT: Negative for ear pain, sinus pain and trouble swallowing.   Respiratory: Negative for shortness of breath.   Cardiovascular: Negative for chest pain and leg swelling.  Genitourinary: Negative for difficulty urinating.  Neurological: Negative for syncope and light-headedness.    Patient Active Problem List   Diagnosis Date Noted  . Benign essential HTN 08/21/2017  . Bilateral carotid artery stenosis 08/21/2017  . Cardiomyopathy, idiopathic ([place]) 08/21/2017  . Carotid artery stenosis, asymptomatic, bilateral 08/06/2017  . Thyroid nodule 08/06/2017  . Cardiac syncope 07/25/2017  . Iron deficiency anemia due to chronic blood loss 05/13/2017  . Spinal stenosis of lumbar region with neurogenic claudication 04/09/2017  . Magnesium deficiency 04/08/2017  . 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 Dizziness 10/10/2016  . LBBB (left bundle branch block) 10/10/2016  . Long term prescription opiate use 09/07/2016  . Long term current use of opiate analgesic 07/09/2016  . Chronic pain syndrome 07/08/2016  . Pain medication agreement signed 07/08/2016  . Spondylosis of lumbar region without myelopathy or radiculopathy 07/08/2016  . Lumbar spondylosis 06/06/2016  . Chronic diastolic CHF (congestive heart failure),  NYHA class 2 ([place]) 06/05/2016  . Hx of cardiac catheterization 06/05/2016  . LVH (left ventricular hypertrophy) due to hypertensive disease, with heart failure ([place]) 06/05/2016  . Mild aortic valve stenosis 06/05/2016  . 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[place]) 06/05/2016  . Sinus congestion 05/30/2016  . Environmental and seasonal allergies 04/19/2016  . Insomnia 04/19/2016  . Bicuspid aortic valve 04/19/2016  . Asthma 04/19/2016  . Aortic regurgitation 04/19/2016  . History of CHF (congestive heart failure) 04/19/2016  . DM (diabetes mellitus), type 2 with renal complications ([place]) 78/29/5621  . Anemia 04/12/2016  . GERD (gastroesophageal reflux disease) 04/12/2016  . Depression with anxiety 04/12/2016  . Irritable bowel syndrome (IBS) 04/12/2016  . Hepatic steatosis 04/12/2016  . Multiple myeloma in remission ([place]) 04/02/2016  . Abnormal stress test 02/14/2016  . 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[place]) 07/06/2015  . Colitis 02/14/2013  . Disequilibrium 01/14/2013    Prior to Admission medications   Medication Sig Start Date End Date Taking? Authorizing Provider  acyclovir (ZOVIRAX) 200 MG capsule TAKE 2 CAPSULES(400 MG) BY MOUTH THREE TIMES DAILY 05/30/17  Yes Glean Hess, MD  allopurinol (ZYLOPRIM) 100 MG tablet TAKE 1 TABLET(100 MG) BY MOUTH DAILY as needed 07/26/17  Yes Gladstone Lighter, MD  aspirin 81 MG chewable tablet Chew 1 tablet (81 mg total) by mouth daily. 07/27/17  Yes Gladstone Lighter, MD  Biotin 1 MG CAPS Take 1 mg by mouth daily.    Yes [provider]  bisoprolol (ZEBETA) 10 MG tablet TAKE 1 TABLET(10 MG) BY MOUTH DAILY 05/30/17  Yes Glean Hess, MD  cholestyramine Lucrezia Starch) 4 g packet Take  4 g once daily. 06/18/16  Yes Cammie Sickle, MD  diphenoxylate-atropine (LOMOTIL) 2.5-0.025 MG tablet Take 2 tablets by mouth daily as needed for diarrhea or loose stools. 09/24/16  Yes Cammie Sickle, MD  DULoxetine (CYMBALTA) 60 MG capsule Take  1 capsule (60 mg total) by mouth daily. 07/26/17  Yes Gladstone Lighter, MD  fentaNYL (DURAGESIC - DOSED MCG/HR) 25 MCG/HR patch Place 25 mcg onto the skin every 3 (three) days. 08/12/16  Yes [provider]  fexofenadine (ALLEGRA) 180 MG tablet TAKE 1 TABLET(180 MG) BY MOUTH DAILY Patient taking differently: TAKE 1 TABLET(180 MG) BY MOUTH DAILY as needed 05/30/17  Yes Glean Hess, MD  fluticasone [facility]) 50 MCG/ACT nasal spray Place 2 sprays into both nostrils daily. 05/30/16  Yes Glean Hess, MD  furosemide (LASIX) 20 MG tablet Take 0.5 tablets (10 mg total) by mouth daily as needed. 05/30/16  Yes Glean Hess, MD  glucose blood (ONE TOUCH ULTRA TEST) test strip  09/04/15  Yes [provider]  hydrOXYzine (ATARAX/VISTARIL) 10 MG tablet TAKE 1 TABLET(10 MG) BY MOUTH THREE TIMES DAILY AS NEEDED 05/30/17  Yes Glean Hess, MD  lisinopril (PRINIVIL,ZESTRIL) 10 MG tablet Take 10 mg by mouth daily.   Yes [provider]  Menaquinone-7 (VITAMIN K2) 100 MCG CAPS Take by mouth.   Yes [provider]  Multiple Minerals-Vitamins (CALCIUM-MAGNESIUM-ZINC-D3) TABS Take by mouth.   Yes [provider]  Multiple Vitamins-Minerals (HAIR/SKIN/NAILS/BIOTIN PO) Take 1 capsule by mouth 3 (three) times daily.   Yes [provider]  NON FORMULARY    Yes [provider]  Omega 3-6-9 Fatty Acids (OMEGA 3-6-9 COMPLEX) CAPS Take by mouth.   Yes [provider]  omeprazole (PRILOSEC) 40 MG capsule Take 1 capsule (40 mg total) by mouth 2 (two) times daily. Patient taking differently: Take 40 mg by mouth daily.  04/09/17  Yes Glean Hess, MD  promethazine (PHENERGAN) 25 MG tablet TAKE 1 TO 2 TABLETS BY MOUTH TWICE DAILY BEFORE MEALS AS NEEDED 08/04/17  Yes Cammie Sickle, MD  tiZANidine (ZANAFLEX) 4 MG tablet TAKE 1 TABLET BY MOUTH EVERY 8 HOURS AS NEEDED, MUSCLE RELAXER FOR BACK PAIN 08/25/17  Yes Glean Hess, MD   Valerian Root 500 MG CAPS Take by mouth.   Yes [provider]  zaleplon (SONATA) 10 MG capsule TAKE ONE CAPSULE BY MOUTH EVERY DAY AT BEDTIME AS NEEDED FOR SLEEP 09/02/17  Yes Glean Hess, MD  amoxicillin (AMOXIL) 500 MG capsule Take 1 capsule (500 mg total) by mouth 2 (two) times daily. 09/16/17   Gio Janoski, Gwyndolyn Saxon, MD  fluconazole (DIFLUCAN) 150 MG tablet Take 1 tablet (150 mg total) by mouth once for 1 dose. 09/16/17 09/16/17  Joanthan Hlavacek, Gwyndolyn Saxon, MD  Zoster Vaccine Adjuvanted [facility]) injection Inject 0.5 mLs into the muscle once for 1 dose. 09/16/17 09/16/17  Adline Potter, MD    Allergies  Allergen Reactions  . Oxycodone-Acetaminophen Anaphylaxis    Swelling and rash  . Codeine   . Benadryl [Diphenhydramine] Palpitations  . Morphine Itching and Rash  . Ondansetron Diarrhea    CAUSES PATIENT DIARRHEA CAUSES PATIENT DIARRHEA CAUSES PATIENT DIARRHEA CAUSES PATIENT DIARRHEA CAUSES PATIENT DIARRHEA  . Tylenol [Acetaminophen] Itching and Rash    Past Surgical History:  Procedure Laterality Date  . ABDOMINAL HYSTERECTOMY    . 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 CARDIAC ELECTROPHYSIOLOGY MAPPING AND ABLATION    . CARPAL TUNNEL RELEASE Bilateral   .  CHOLECYSTECTOMY  2008  . COLONOSCOPY WITH PROPOFOL N/A 05/07/2017   Procedure: COLONOSCOPY WITH PROPOFOL;  Surgeon: [name], MD;  Location[facility]COPY;  Service: Gastroenterology;  Laterality: N/A;  . ESOPHAGOGASTRODUODENOSCOPY (EGD) WITH PROPOFOL N/A 05/07/2017   Procedure: ESOPHAGOGASTRODUODENOSCOPY (EGD) WITH PROPOFOL;  Surgeon: [name], MD;  Location[facility] ENDOSCOPY;  Service: Gastroenterology;  Laterality: N/A;  . FOOT SURGERY Bilateral   . INCONTINENCE SURGERY  2009  . PARTIAL HYSTERECTOMY  03/1996   fibroids  . TONSILLECTOMY  2007    Social History   Tobacco Use  . Smoking status: Former Smoker    Packs/day: 1.00    Years: 20.00    Pack years: 20.00    Types: Cigarettes    Last attempt to quit:  07/02/1991    Years since quitting: 26.2  . Smokeless tobacco: Never Used  Substance Use Topics  . Alcohol use: Yes    Alcohol/week: 1.2 oz    Types: 1 Glasses of wine, 1 Cans of beer per week    Comment: occassional  . Drug use: No    Family History  Problem Relation Age of Onset  . Colon cancer Father   . Renal Disease Father   . Diabetes Mellitus II Father   . Melanoma Paternal Grandmother   . Breast cancer Maternal Aunt 67  . Anemia Mother   . Heart disease Mother   . Heart failure Mother   . Renal Disease Mother   . Congestive Heart Failure Mother   . Heart disease Maternal Uncle   . Throat cancer Maternal Uncle   . Lung cancer Maternal Uncle   . Liver disease Maternal Uncle   . Heart failure Maternal Uncle   . Hearing loss Son 68       Suicide     Medication list has been reviewed and updated.  Physical Examination: BP 120/80   Pulse 71   Temp 99.5 F (37.5 C) (Oral)   Resp 16   Ht 5' 2"  (1.575 m)   Wt 155 lb (70.3 kg)   SpO2 97%   BMI 28.35 kg/m   Physical Exam  Constitutional: She appears well-developed and well-nourished. No distress.  HENT:  Mouth/Throat: No oropharyngeal exudate.  OP mild erythema  Cardiovascular: Normal rate, regular rhythm and normal heart sounds.  Pulmonary/Chest: Effort normal and breath sounds normal.  Lymphadenopathy:    She has no cervical adenopathy.  Neurological: She is alert.  Skin: Skin is warm and dry. No rash noted.  Psychiatric: She has a normal mood and affect. Her behavior is normal.  Nursing note and vitals reviewed.   Assessment and Plan:  1. Strep pharyngitis Amox 500 mg bid x 10d, consider testing when well for carrier state  2. Sore throat RST positive - POCT rapid strep A  3. Need for zoster vaccination Shingrix ordered, obtain when well  Return if symptoms worsen or fail to improve.  Satira Anis. [facility]  09/16/2017

## 2017-09-17 ENCOUNTER — Other Ambulatory Visit: Payer: Self-pay | Admitting: Otolaryngology

## 2017-09-17 DIAGNOSIS — E041 Nontoxic single thyroid nodule: Secondary | ICD-10-CM

## 2017-09-22 ENCOUNTER — Inpatient Hospital Stay: Payer: BLUE CROSS/BLUE SHIELD

## 2017-09-22 VITALS — BP 120/73 | HR 66 | Temp 96.9°F | Resp 18

## 2017-09-22 DIAGNOSIS — C9001 Multiple myeloma in remission: Secondary | ICD-10-CM

## 2017-09-22 LAB — MAGNESIUM: MAGNESIUM: 1.5 mg/dL — AB (ref 1.7–2.4)

## 2017-09-22 MED ORDER — SODIUM CHLORIDE 0.9 % IV SOLN
Freq: Once | INTRAVENOUS | Status: AC
  Administered 2017-09-22: 09:00:00 via INTRAVENOUS
  Filled 2017-09-22: qty 1000

## 2017-09-22 MED ORDER — MAGNESIUM SULFATE 2 GM/50ML IV SOLN
2.0000 g | Freq: Once | INTRAVENOUS | Status: AC
  Administered 2017-09-22: 2 g via INTRAVENOUS
  Filled 2017-09-22: qty 50

## 2017-09-22 NOTE — Progress Notes (Signed)
Patient reports that the biopsy on her thyroid was benign.  She developed strep throat last Tuesday and is still taking amoxicillin.  She reports still having a cough and coughing up yellow sputum.  Magnesium is 1.5 today even after missing her appointment last week.  Dr. Rogue Bussing notified.

## 2017-09-22 NOTE — Patient Instructions (Signed)
Magnesium Sulfate injection What is this medicine? MAGNESIUM SULFATE (mag NEE zee um SUL fate) is an electrolyte injection commonly used to treat low magnesium levels in your blood. It is also used to prevent or control seizures in women with preeclampsia or eclampsia. This medicine may be used for other purposes; ask your health care provider or pharmacist if you have questions. What should I tell my health care provider before I take this medicine? They need to know if you have any of these conditions: -heart disease -history of irregular heart beat -kidney disease -an unusual or allergic reaction to magnesium sulfate, medicines, foods, dyes, or preservatives -pregnant or trying to get pregnant -breast-feeding How should I use this medicine? This medicine is for infusion into a vein. It is given by a health care professional in a hospital or clinic setting. Talk to your pediatrician regarding the use of this medicine in children. While this drug may be prescribed for selected conditions, precautions do apply. Overdosage: If you think you have taken too much of this medicine contact a poison control center or emergency room at once. NOTE: This medicine is only for you. Do not share this medicine with others. What if I miss a dose? This does not apply. What may interact with this medicine? This medicine may interact with the following medications: -certain medicines for anxiety or sleep -certain medicines for seizures like phenobarbital -digoxin -medicines that relax muscles for surgery -narcotic medicines for pain This list may not describe all possible interactions. Give your health care provider a list of all the medicines, herbs, non-prescription drugs, or dietary supplements you use. Also tell them if you smoke, drink alcohol, or use illegal drugs. Some items may interact with your medicine. What should I watch for while using this medicine? Your condition will be monitored carefully  while you are receiving this medicine. You may need blood work done while you are receiving this medicine. What side effects may I notice from receiving this medicine? Side effects that you should report to your doctor or health care professional as soon as possible: -allergic reactions like skin rash, itching or hives, swelling of the face, lips, or tongue -facial flushing -muscle weakness -signs and symptoms of low blood pressure like dizziness; feeling faint or lightheaded, falls; unusually weak or tired -signs and symptoms of a dangerous change in heartbeat or heart rhythm like chest pain; dizziness; fast or irregular heartbeat; palpitations; breathing problems -sweating This list may not describe all possible side effects. Call your doctor for medical advice about side effects. You may report side effects to FDA at 1-800-FDA-1088. Where should I keep my medicine? This drug is given in a hospital or clinic and will not be stored at home. NOTE: This sheet is a summary. It may not cover all possible information. If you have questions about this medicine, talk to your doctor, pharmacist, or health care provider.  2018 Elsevier/Gold Standard (2016-01-03 12:31:42)  

## 2017-09-23 ENCOUNTER — Other Ambulatory Visit: Payer: Self-pay | Admitting: Internal Medicine

## 2017-09-30 ENCOUNTER — Inpatient Hospital Stay: Payer: BLUE CROSS/BLUE SHIELD | Attending: Internal Medicine

## 2017-09-30 ENCOUNTER — Inpatient Hospital Stay: Payer: BLUE CROSS/BLUE SHIELD

## 2017-09-30 VITALS — BP 99/62 | HR 71 | Resp 18

## 2017-09-30 DIAGNOSIS — Z79899 Other long term (current) drug therapy: Secondary | ICD-10-CM | POA: Diagnosis not present

## 2017-09-30 DIAGNOSIS — E1122 Type 2 diabetes mellitus with diabetic chronic kidney disease: Secondary | ICD-10-CM | POA: Diagnosis not present

## 2017-09-30 DIAGNOSIS — I130 Hypertensive heart and chronic kidney disease with heart failure and stage 1 through stage 4 chronic kidney disease, or unspecified chronic kidney disease: Secondary | ICD-10-CM | POA: Insufficient documentation

## 2017-09-30 DIAGNOSIS — Z9221 Personal history of antineoplastic chemotherapy: Secondary | ICD-10-CM | POA: Insufficient documentation

## 2017-09-30 DIAGNOSIS — K529 Noninfective gastroenteritis and colitis, unspecified: Secondary | ICD-10-CM | POA: Insufficient documentation

## 2017-09-30 DIAGNOSIS — G8929 Other chronic pain: Secondary | ICD-10-CM | POA: Diagnosis not present

## 2017-09-30 DIAGNOSIS — C9001 Multiple myeloma in remission: Secondary | ICD-10-CM | POA: Insufficient documentation

## 2017-09-30 DIAGNOSIS — M549 Dorsalgia, unspecified: Secondary | ICD-10-CM | POA: Diagnosis not present

## 2017-09-30 DIAGNOSIS — Z87891 Personal history of nicotine dependence: Secondary | ICD-10-CM | POA: Diagnosis not present

## 2017-09-30 DIAGNOSIS — R252 Cramp and spasm: Secondary | ICD-10-CM | POA: Insufficient documentation

## 2017-09-30 DIAGNOSIS — Z7982 Long term (current) use of aspirin: Secondary | ICD-10-CM | POA: Diagnosis not present

## 2017-09-30 DIAGNOSIS — N183 Chronic kidney disease, stage 3 (moderate): Secondary | ICD-10-CM | POA: Insufficient documentation

## 2017-09-30 DIAGNOSIS — K589 Irritable bowel syndrome without diarrhea: Secondary | ICD-10-CM | POA: Diagnosis not present

## 2017-09-30 LAB — MAGNESIUM: Magnesium: 1.3 mg/dL — ABNORMAL LOW (ref 1.7–2.4)

## 2017-09-30 MED ORDER — SODIUM CHLORIDE 0.9 % IV SOLN
Freq: Once | INTRAVENOUS | Status: AC
  Administered 2017-09-30: 10:00:00 via INTRAVENOUS
  Filled 2017-09-30: qty 1000

## 2017-09-30 MED ORDER — MAGNESIUM SULFATE 2 GM/50ML IV SOLN
2.0000 g | Freq: Once | INTRAVENOUS | Status: AC
  Administered 2017-09-30: 2 g via INTRAVENOUS
  Filled 2017-09-30: qty 50

## 2017-09-30 NOTE — Patient Instructions (Signed)
Hypomagnesemia  Hypomagnesemia is a condition in which the level of magnesium in the blood is low. Magnesium is a mineral that is found in many foods. It is used in many different processes in the body. Hypomagnesemia can affect every organ in the body. It can cause life-threatening problems.  What are the causes?  Causes of hypomagnesemia include:   Not getting enough magnesium in your diet.   Malnutrition.   Problems with absorbing magnesium from the intestines.   Dehydration.   Alcohol abuse.   Vomiting.   Severe diarrhea.   Some medicines, including medicines that make you urinate more.   Certain diseases, such as kidney disease, diabetes, and overactive thyroid.    What are the signs or symptoms?   Involuntary shaking or trembling of a body part (tremor).   Confusion.   Muscle weakness.   Sensitivity to light, sound, and touch.   Psychiatric issues, such as depression, irritability, or psychosis.   Sudden tightening of muscles (muscle spasms).   Tingling in the arms and legs.   A feeling of fluttering of the heart.  These symptoms are more severe if magnesium levels drop suddenly.  How is this diagnosed?  To make a diagnosis, your health care provider will do a physical exam and order blood and urine tests.  How is this treated?  Treatment will depend on the cause and the severity of your condition. It may involve:   A magnesium supplement. This can be taken in pill form. It can also be given through an IV tube. This is usually done if the condition is severe.   Changes to your diet. You may be directed to eat foods that have a lot of magnesium, such as green leafy vegetables, peas, beans, and nuts.   Eliminating alcohol from your diet.    Follow these instructions at home:   Include foods with magnesium in your diet. Foods that are rich in magnesium include green vegetables, beans, nuts and seeds, and whole grains.   Take medicines only as directed by your health care provider.   Take  magnesium supplements if your health care provider instructs you to do that. Take them as directed.   Have your magnesium levels monitored as directed by your health care provider.   When you are active, drink fluids that contain electrolytes.   Keep all follow-up visits as directed by your health care provider. This is important.  Contact a health care provider if:   You get worse instead of better.   Your symptoms return.  Get help right away if:   Your symptoms are severe.  This information is not intended to replace advice given to you by your health care provider. Make sure you discuss any questions you have with your health care provider.  Document Released: 03/13/2005 Document Revised: 11/23/2015 Document Reviewed: 01/31/2014  Elsevier Interactive Patient Education  2018 [employer]vier [employer]

## 2017-10-01 ENCOUNTER — Other Ambulatory Visit: Payer: Self-pay | Admitting: Internal Medicine

## 2017-10-07 ENCOUNTER — Inpatient Hospital Stay: Payer: BLUE CROSS/BLUE SHIELD

## 2017-10-07 VITALS — BP 96/60 | HR 73 | Temp 95.6°F | Resp 18

## 2017-10-07 DIAGNOSIS — C9001 Multiple myeloma in remission: Secondary | ICD-10-CM

## 2017-10-07 LAB — MAGNESIUM: Magnesium: 1.3 mg/dL — ABNORMAL LOW (ref 1.7–2.4)

## 2017-10-07 MED ORDER — SODIUM CHLORIDE 0.9 % IV SOLN
Freq: Once | INTRAVENOUS | Status: AC
  Administered 2017-10-07: 10:00:00 via INTRAVENOUS
  Filled 2017-10-07: qty 1000

## 2017-10-07 MED ORDER — MAGNESIUM SULFATE 2 GM/50ML IV SOLN
2.0000 g | Freq: Once | INTRAVENOUS | Status: AC
  Administered 2017-10-07: 2 g via INTRAVENOUS
  Filled 2017-10-07: qty 50

## 2017-10-07 NOTE — Patient Instructions (Signed)
Magnesium Sulfate injection What is this medicine? MAGNESIUM SULFATE (mag NEE zee um SUL fate) is an electrolyte injection commonly used to treat low magnesium levels in your blood. It is also used to prevent or control seizures in women with preeclampsia or eclampsia. This medicine may be used for other purposes; ask your health care provider or pharmacist if you have questions. What should I tell my health care provider before I take this medicine? They need to know if you have any of these conditions: -heart disease -history of irregular heart beat -kidney disease -an unusual or allergic reaction to magnesium sulfate, medicines, foods, dyes, or preservatives -pregnant or trying to get pregnant -breast-feeding How should I use this medicine? This medicine is for infusion into a vein. It is given by a health care professional in a hospital or clinic setting. Talk to your pediatrician regarding the use of this medicine in children. While this drug may be prescribed for selected conditions, precautions do apply. Overdosage: If you think you have taken too much of this medicine contact a poison control center or emergency room at once. NOTE: This medicine is only for you. Do not share this medicine with others. What if I miss a dose? This does not apply. What may interact with this medicine? This medicine may interact with the following medications: -certain medicines for anxiety or sleep -certain medicines for seizures like phenobarbital -digoxin -medicines that relax muscles for surgery -narcotic medicines for pain This list may not describe all possible interactions. Give your health care provider a list of all the medicines, herbs, non-prescription drugs, or dietary supplements you use. Also tell them if you smoke, drink alcohol, or use illegal drugs. Some items may interact with your medicine. What should I watch for while using this medicine? Your condition will be monitored carefully  while you are receiving this medicine. You may need blood work done while you are receiving this medicine. What side effects may I notice from receiving this medicine? Side effects that you should report to your doctor or health care professional as soon as possible: -allergic reactions like skin rash, itching or hives, swelling of the face, lips, or tongue -facial flushing -muscle weakness -signs and symptoms of low blood pressure like dizziness; feeling faint or lightheaded, falls; unusually weak or tired -signs and symptoms of a dangerous change in heartbeat or heart rhythm like chest pain; dizziness; fast or irregular heartbeat; palpitations; breathing problems -sweating This list may not describe all possible side effects. Call your doctor for medical advice about side effects. You may report side effects to FDA at 1-800-FDA-1088. Where should I keep my medicine? This drug is given in a hospital or clinic and will not be stored at home. NOTE: This sheet is a summary. It may not cover all possible information. If you have questions about this medicine, talk to your doctor, pharmacist, or health care provider.  2018 Elsevier/Gold Standard (2016-01-03 12:31:42)  

## 2017-10-10 ENCOUNTER — Ambulatory Visit: Payer: BLUE CROSS/BLUE SHIELD | Admitting: Internal Medicine

## 2017-10-10 ENCOUNTER — Encounter: Payer: Self-pay | Admitting: Internal Medicine

## 2017-10-10 VITALS — BP 122/78 | HR 67 | Ht 62.0 in | Wt 151.0 lb

## 2017-10-10 DIAGNOSIS — G8929 Other chronic pain: Secondary | ICD-10-CM | POA: Diagnosis not present

## 2017-10-10 DIAGNOSIS — N182 Chronic kidney disease, stage 2 (mild): Secondary | ICD-10-CM

## 2017-10-10 DIAGNOSIS — E041 Nontoxic single thyroid nodule: Secondary | ICD-10-CM | POA: Diagnosis not present

## 2017-10-10 DIAGNOSIS — E1122 Type 2 diabetes mellitus with diabetic chronic kidney disease: Secondary | ICD-10-CM | POA: Diagnosis not present

## 2017-10-10 DIAGNOSIS — F418 Other specified anxiety disorders: Secondary | ICD-10-CM

## 2017-10-10 DIAGNOSIS — I6523 Occlusion and stenosis of bilateral carotid arteries: Secondary | ICD-10-CM

## 2017-10-10 DIAGNOSIS — M25511 Pain in right shoulder: Secondary | ICD-10-CM

## 2017-10-10 MED ORDER — PRAVASTATIN SODIUM 20 MG PO TABS
20.0000 mg | ORAL_TABLET | Freq: Every day | ORAL | 3 refills | Status: DC
Start: 2017-10-10 — End: 2018-02-15

## 2017-10-10 MED ORDER — DULOXETINE HCL 30 MG PO CPEP: 90.0000 mg | ORAL_CAPSULE | Freq: Every day | ORAL | 3 refills | Status: DC

## 2017-10-10 NOTE — Progress Notes (Signed)
Date:  10/10/2017   Name:  Sarah [name]   DOB:  27-Mar-1956   MRN:  [id number]   Chief Complaint: Follow-up (W/ labs.)  Thyroid Problem  Presents for follow-up visit. Symptoms include fatigue. Patient reports no palpitations. (Nodule biopsied last month - benign) The symptoms have been stable (will repeat US in one year).  Diabetes  She presents for her follow-up diabetic visit. She has type 2 diabetes mellitus. Her disease course has been fluctuating. Pertinent negatives for hypoglycemia include no dizziness or headaches. Associated symptoms include fatigue. Pertinent negatives for diabetes include no chest pain. Symptoms are stable. Current diabetic treatment includes diet. She monitors blood glucose at home 1-2 x per day. There is no change in her home blood glucose trend. Her breakfast blood glucose is taken between 6-7 am. Her breakfast blood glucose range is generally 130-140 mg/dl. An ACE inhibitor/angiotensin II receptor blocker is being taken.  Shoulder Pain   The pain is present in the right shoulder. This is a chronic problem. The problem occurs daily. The problem has been gradually worsening. The quality of the pain is described as aching and sharp. The pain is moderate (interupting sleep). Pertinent negatives include no fever.  Depression         This is a chronic problem.  The problem has been gradually worsening since onset.  Associated symptoms include fatigue, insomnia, body aches, myalgias and sad.  Associated symptoms include no headaches and no suicidal ideas.  Past treatments include SNRIs - Serotonin and norepinephrine reuptake inhibitors.  Compliance with treatment is good.  Past medical history includes thyroid problem.       Review of Systems  Constitutional: Positive for fatigue. Negative for chills, fever and unexpected weight change.  HENT: Negative for sore throat and trouble swallowing.   Respiratory: Negative for cough, chest tightness and shortness of  breath.   Cardiovascular: Negative for chest pain, palpitations and leg swelling.  Musculoskeletal: Positive for arthralgias and myalgias.  Skin: Negative for color change.  Neurological: Negative for dizziness and headaches.  Hematological: Negative for adenopathy.  Psychiatric/Behavioral: Positive for depression, dysphoric mood and sleep disturbance. Negative for suicidal ideas. The patient has insomnia.     Patient Active Problem List   Diagnosis Date Noted  . Benign essential HTN 08/21/2017  . Bilateral carotid artery stenosis 08/21/2017  . Cardiomyopathy, idiopathic ([place]) 08/21/2017  . Carotid artery stenosis, asymptomatic, bilateral 08/06/2017  . Thyroid nodule 08/06/2017  . Cardiac syncope 07/25/2017  . Iron deficiency anemia due to chronic blood loss 05/13/2017  . Spinal stenosis of lumbar region with neurogenic claudication 04/09/2017  . Magnesium deficiency 04/08/2017  . 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 Dizziness 10/10/2016  . LBBB (left bundle branch block) 10/10/2016  . Long term prescription opiate use 09/07/2016  . Long term current use of opiate analgesic 07/09/2016  . Chronic pain syndrome 07/08/2016  . Pain medication agreement signed 07/08/2016  . Spondylosis of lumbar region without myelopathy or radiculopathy 07/08/2016  . Lumbar spondylosis 06/06/2016  . Chronic diastolic CHF (congestive heart failure), NYHA class 2 ([place]) 06/05/2016  . Hx of cardiac catheterization 06/05/2016  . LVH (left ventricular hypertrophy) due to hypertensive disease, with heart failure ([place]) 06/05/2016  . Mild aortic valve stenosis 06/05/2016  . 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[place]) 06/05/2016  . Sinus congestion 05/30/2016  . Environmental and seasonal allergies 04/19/2016  . Insomnia 04/19/2016  . Bicuspid aortic valve 04/19/2016  . Asthma 04/19/2016  . Aortic regurgitation 04/19/2016  .  History of CHF (congestive heart failure) 04/19/2016  . DM  (diabetes mellitus), type 2 with renal complications ([place]) 16/55/3748  . Anemia 04/12/2016  . GERD (gastroesophageal reflux disease) 04/12/2016  . Depression with anxiety 04/12/2016  . Irritable bowel syndrome (IBS) 04/12/2016  . Hepatic steatosis 04/12/2016  . Multiple myeloma in remission ([place]) 04/02/2016  . Abnormal stress test 02/14/2016  . 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[place]) 07/06/2015  . Colitis 02/14/2013  . Disequilibrium 01/14/2013    Prior to Admission medications   Medication Sig Start Date End Date Taking? Authorizing Provider  acyclovir (ZOVIRAX) 200 MG capsule TAKE 2 CAPSULES(400 MG) BY MOUTH THREE TIMES DAILY 05/30/17  Yes Glean Hess, MD  allopurinol (ZYLOPRIM) 100 MG tablet TAKE 1 TABLET(100 MG) BY MOUTH DAILY as needed 07/26/17  Yes Gladstone Lighter, MD  aspirin 81 MG chewable tablet Chew 1 tablet (81 mg total) by mouth daily. 07/27/17  Yes Gladstone Lighter, MD  bisoprolol (ZEBETA) 10 MG tablet TAKE 1 TABLET(10 MG) BY MOUTH DAILY 05/30/17  Yes Glean Hess, MD  cholestyramine Lucrezia Starch) 4 g packet Take 4 g once daily. 06/18/16  Yes Cammie Sickle, MD  diphenoxylate-atropine (LOMOTIL) 2.5-0.025 MG tablet Take 2 tablets by mouth daily as needed for diarrhea or loose stools. 09/24/16  Yes Cammie Sickle, MD  DULoxetine (CYMBALTA) 60 MG capsule Take 1 capsule (60 mg total) by mouth daily. 07/26/17  Yes Gladstone Lighter, MD  fentaNYL (DURAGESIC - DOSED MCG/HR) 25 MCG/HR patch Place 25 mcg onto the skin every 3 (three) days. 08/12/16  Yes [provider]  fexofenadine (ALLEGRA) 180 MG tablet TAKE 1 TABLET(180 MG) BY MOUTH DAILY Patient taking differently: TAKE 1 TABLET(180 MG) BY MOUTH DAILY as needed 05/30/17  Yes Glean Hess, MD  fluticasone [facility]) 50 MCG/ACT nasal spray Place 2 sprays into both nostrils daily. 05/30/16  Yes Glean Hess, MD  furosemide (LASIX) 20 MG tablet Take 0.5 tablets (10 mg total) by mouth  daily as needed. 05/30/16  Yes Glean Hess, MD  glucose blood (ONE TOUCH ULTRA TEST) test strip  09/04/15  Yes [provider]  hydrOXYzine (ATARAX/VISTARIL) 10 MG tablet TAKE 1 TABLET(10 MG) BY MOUTH THREE TIMES DAILY AS NEEDED 05/30/17  Yes Glean Hess, MD  lisinopril (PRINIVIL,ZESTRIL) 10 MG tablet Take 10 mg by mouth daily.   Yes [provider]  Menaquinone-7 (VITAMIN K2) 100 MCG CAPS Take by mouth.   Yes [provider]  Multiple Minerals-Vitamins (CALCIUM-MAGNESIUM-ZINC-D3) TABS Take by mouth.   Yes [provider]  Multiple Vitamins-Minerals (HAIR/SKIN/NAILS/BIOTIN PO) Take 1 capsule by mouth 3 (three) times daily.   Yes [provider]  NON FORMULARY    Yes [provider]  Omega 3-6-9 Fatty Acids (OMEGA 3-6-9 COMPLEX) CAPS Take by mouth.   Yes [provider]  omeprazole (PRILOSEC) 40 MG capsule Take 1 capsule (40 mg total) by mouth 2 (two) times daily. Patient taking differently: Take 40 mg by mouth daily.  04/09/17  Yes Glean Hess, MD  promethazine (PHENERGAN) 25 MG tablet TAKE 1 TO 2 TABLETS BY MOUTH TWICE DAILY BEFORE MEALS AS NEEDED 10/01/17  Yes Cammie Sickle, MD  tiZANidine (ZANAFLEX) 4 MG tablet TAKE 1 TABLET BY MOUTH EVERY 8 HOURS AS NEEDED, MUSCLE RELAXER FOR BACK PAIN 09/23/17  Yes Glean Hess, MD  Valerian Root 500 MG CAPS Take by mouth.   Yes [provider]  zaleplon (SONATA) 10 MG capsule TAKE ONE CAPSULE  BY MOUTH EVERY DAY AT BEDTIME AS NEEDED FOR SLEEP 09/02/17  Yes Glean Hess, MD    Allergies  Allergen Reactions  . Oxycodone-Acetaminophen Anaphylaxis    Swelling and rash  . Codeine   . Benadryl [Diphenhydramine] Palpitations  . Morphine Itching and Rash  . Ondansetron Diarrhea    CAUSES PATIENT DIARRHEA CAUSES PATIENT DIARRHEA CAUSES PATIENT DIARRHEA CAUSES PATIENT DIARRHEA CAUSES PATIENT DIARRHEA  . Tylenol [Acetaminophen] Itching and Rash    Past  Surgical History:  Procedure Laterality Date  . ABDOMINAL HYSTERECTOMY    . 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 CARDIAC ELECTROPHYSIOLOGY MAPPING AND ABLATION    . CARPAL TUNNEL RELEASE Bilateral   . CHOLECYSTECTOMY  2008  . COLONOSCOPY WITH PROPOFOL N/A 05/07/2017   Procedure: COLONOSCOPY WITH PROPOFOL;  Surgeon: [name], MD;  Location[facility] ENDOSCOPY;  Service: Gastroenterology;  Laterality: N/A;  . ESOPHAGOGASTRODUODENOSCOPY (EGD) WITH PROPOFOL N/A 05/07/2017   Procedure: ESOPHAGOGASTRODUODENOSCOPY (EGD) WITH PROPOFOL;  Surgeon: [name], MD;  Location: [facility] ENDOSCOPY;  Service: Gastroenterology;  Laterality: N/A;  . FOOT SURGERY Bilateral   . INCONTINENCE SURGERY  2009  . PARTIAL HYSTERECTOMY  03/1996   fibroids  . TONSILLECTOMY  2007    Social History   Tobacco Use  . Smoking status: Former Smoker    Packs/day: 1.00    Years: 20.00    Pack years: 20.00    Types: Cigarettes    Last attempt to quit: 07/02/1991    Years since quitting: 26.2  . Smokeless tobacco: Never Used  Substance Use Topics  . Alcohol use: Yes    Alcohol/week: 1.2 oz    Types: 1 Glasses of wine, 1 Cans of beer per week    Comment: occassional  . Drug use: No     Medication list has been reviewed and updated.  PHQ 2/9 Scores 08/06/2017 08/06/2017 04/09/2017 09/02/2016  PHQ - 2 Score 0 0 1 1  PHQ- 9 Score 6 0 5 7    Physical Exam  Constitutional: She is oriented to person, place, and time. She appears well-developed and well-nourished.  Neck: Normal range of motion. Neck supple. No thyromegaly present.  Cardiovascular: Normal rate and regular rhythm.  Murmur heard. Pulmonary/Chest: Effort normal and breath sounds normal.  Musculoskeletal:       Right shoulder: She exhibits decreased range of motion, tenderness and swelling.  Neurological: She is alert and oriented to person, place, and time.  Psychiatric: She has a normal mood and affect. Judgment and thought content normal. Cognition and  memory are normal.    BP 122/78   Pulse 67   Ht 5' 2"  (1.575 m)   Wt 151 lb (68.5 kg)   SpO2 99%   BMI 27.62 kg/m   Assessment and Plan: 1. Type 2 diabetes mellitus with stage 2 chronic kidney disease, without long-term current use of insulin (HCC) Check labs and advise - Hemoglobin A1c  2. Bilateral carotid artery stenosis Begin low dose, low potency statin - pravastatin (PRAVACHOL) 20 MG tablet; Take 1 tablet (20 mg total) by mouth daily.  Dispense: 30 tablet; Refill: 3  3. Chronic right shoulder pain - Ambulatory referral to Orthopedic Surgery  4. Thyroid nodule Benign on bx F/u with ENT in one year  5. Depression with anxiety Increase cymbalta - DULoxetine (CYMBALTA) 30 MG capsule; Take 3 capsules (90 mg total) by mouth daily.  Dispense: 90 capsule; Refill: 3   Meds ordered this encounter  Medications  . DULoxetine (CYMBALTA) 30  MG capsule    Sig: Take 3 capsules (90 mg total) by mouth daily.    Dispense:  90 capsule    Refill:  3  . pravastatin (PRAVACHOL) 20 MG tablet    Sig: Take 1 tablet (20 mg total) by mouth daily.    Dispense:  30 tablet    Refill:  3    Partially dictated using Editor, commissioning. Any errors are unintentional.  Halina Maidens, MD [facility]  10/10/2017

## 2017-10-11 LAB — HEMOGLOBIN A1C
Est. average glucose Bld gHb Est-mCnc: 137 mg/dL
Hgb A1c MFr Bld: 6.4 % — ABNORMAL HIGH (ref 4.8–5.6)

## 2017-10-14 ENCOUNTER — Inpatient Hospital Stay: Payer: BLUE CROSS/BLUE SHIELD

## 2017-10-21 ENCOUNTER — Inpatient Hospital Stay: Payer: BLUE CROSS/BLUE SHIELD

## 2017-10-21 DIAGNOSIS — C9001 Multiple myeloma in remission: Secondary | ICD-10-CM

## 2017-10-21 LAB — CBC WITH DIFFERENTIAL/PLATELET
BASOS PCT: 1 %
Basophils Absolute: 0 10*3/uL (ref 0–0.1)
Eosinophils Absolute: 0.1 10*3/uL (ref 0–0.7)
Eosinophils Relative: 2 %
HEMATOCRIT: 34.8 % — AB (ref 35.0–47.0)
Hemoglobin: 12.1 g/dL (ref 12.0–16.0)
LYMPHS ABS: 1.2 10*3/uL (ref 1.0–3.6)
LYMPHS PCT: 17 %
MCH: 31.2 pg (ref 26.0–34.0)
MCHC: 34.7 g/dL (ref 32.0–36.0)
MCV: 89.8 fL (ref 80.0–100.0)
MONO ABS: 0.6 10*3/uL (ref 0.2–0.9)
MONOS PCT: 8 %
NEUTROS ABS: 5.4 10*3/uL (ref 1.4–6.5)
Neutrophils Relative %: 72 %
Platelets: 134 10*3/uL — ABNORMAL LOW (ref 150–440)
RBC: 3.88 MIL/uL (ref 3.80–5.20)
RDW: 13.5 % (ref 11.5–14.5)
WBC: 7.5 10*3/uL (ref 3.6–11.0)

## 2017-10-21 LAB — COMPREHENSIVE METABOLIC PANEL
ALK PHOS: 102 U/L (ref 38–126)
ALT: 24 U/L (ref 14–54)
ANION GAP: 10 (ref 5–15)
AST: 27 U/L (ref 15–41)
Albumin: 4.4 g/dL (ref 3.5–5.0)
BUN: 51 mg/dL — ABNORMAL HIGH (ref 6–20)
CALCIUM: 8.9 mg/dL (ref 8.9–10.3)
CO2: 21 mmol/L — AB (ref 22–32)
Chloride: 108 mmol/L (ref 101–111)
Creatinine, Ser: 1.29 mg/dL — ABNORMAL HIGH (ref 0.44–1.00)
GFR calc Af Amer: 51 mL/min — ABNORMAL LOW (ref >60)
GFR calc non Af Amer: 44 mL/min — ABNORMAL LOW (ref >60)
GLUCOSE: 135 mg/dL — AB (ref 65–99)
Potassium: 4.7 mmol/L (ref 3.5–5.1)
SODIUM: 139 mmol/L (ref 135–145)
Total Bilirubin: 0.5 mg/dL (ref 0.3–1.2)
Total Protein: 8.1 g/dL (ref 6.5–8.1)

## 2017-10-21 LAB — MAGNESIUM: Magnesium: 1.3 mg/dL — ABNORMAL LOW (ref 1.7–2.4)

## 2017-10-21 MED ORDER — SODIUM CHLORIDE 0.9 % IV SOLN
Freq: Once | INTRAVENOUS | Status: AC
  Administered 2017-10-21: 09:00:00 via INTRAVENOUS
  Filled 2017-10-21: qty 1000

## 2017-10-21 MED ORDER — MAGNESIUM SULFATE 2 GM/50ML IV SOLN
2.0000 g | Freq: Once | INTRAVENOUS | Status: AC
  Administered 2017-10-21: 2 g via INTRAVENOUS
  Filled 2017-10-21: qty 50

## 2017-10-21 NOTE — Patient Instructions (Signed)
Magnesium Sulfate injection What is this medicine? MAGNESIUM SULFATE (mag NEE zee um SUL fate) is an electrolyte injection commonly used to treat low magnesium levels in your blood. It is also used to prevent or control seizures in women with preeclampsia or eclampsia. This medicine may be used for other purposes; ask your health care provider or pharmacist if you have questions. What should I tell my health care provider before I take this medicine? They need to know if you have any of these conditions: -heart disease -history of irregular heart beat -kidney disease -an unusual or allergic reaction to magnesium sulfate, medicines, foods, dyes, or preservatives -pregnant or trying to get pregnant -breast-feeding How should I use this medicine? This medicine is for infusion into a vein. It is given by a health care professional in a hospital or clinic setting. Talk to your pediatrician regarding the use of this medicine in children. While this drug may be prescribed for selected conditions, precautions do apply. Overdosage: If you think you have taken too much of this medicine contact a poison control center or emergency room at once. NOTE: This medicine is only for you. Do not share this medicine with others. What if I miss a dose? This does not apply. What may interact with this medicine? This medicine may interact with the following medications: -certain medicines for anxiety or sleep -certain medicines for seizures like phenobarbital -digoxin -medicines that relax muscles for surgery -narcotic medicines for pain This list may not describe all possible interactions. Give your health care provider a list of all the medicines, herbs, non-prescription drugs, or dietary supplements you use. Also tell them if you smoke, drink alcohol, or use illegal drugs. Some items may interact with your medicine. What should I watch for while using this medicine? Your condition will be monitored carefully  while you are receiving this medicine. You may need blood work done while you are receiving this medicine. What side effects may I notice from receiving this medicine? Side effects that you should report to your doctor or health care professional as soon as possible: -allergic reactions like skin rash, itching or hives, swelling of the face, lips, or tongue -facial flushing -muscle weakness -signs and symptoms of low blood pressure like dizziness; feeling faint or lightheaded, falls; unusually weak or tired -signs and symptoms of a dangerous change in heartbeat or heart rhythm like chest pain; dizziness; fast or irregular heartbeat; palpitations; breathing problems -sweating This list may not describe all possible side effects. Call your doctor for medical advice about side effects. You may report side effects to FDA at 1-800-FDA-1088. Where should I keep my medicine? This drug is given in a hospital or clinic and will not be stored at home. NOTE: This sheet is a summary. It may not cover all possible information. If you have questions about this medicine, talk to your doctor, pharmacist, or health care provider.  2018 Elsevier/Gold Standard (2016-01-03 12:31:42)  

## 2017-10-22 LAB — KAPPA/LAMBDA LIGHT CHAINS
KAPPA FREE LGHT CHN: 14.9 mg/L (ref 3.3–19.4)
Kappa, lambda light chain ratio: 0.4 (ref 0.26–1.65)
Lambda free light chains: 36.9 mg/L — ABNORMAL HIGH (ref 5.7–26.3)

## 2017-10-23 ENCOUNTER — Other Ambulatory Visit: Payer: Self-pay | Admitting: Internal Medicine

## 2017-10-23 LAB — MULTIPLE MYELOMA PANEL, SERUM
ALBUMIN SERPL ELPH-MCNC: 4.3 g/dL (ref 2.9–4.4)
ALBUMIN/GLOB SERPL: 1.6 (ref 0.7–1.7)
ALPHA 1: 0.2 g/dL (ref 0.0–0.4)
ALPHA2 GLOB SERPL ELPH-MCNC: 1.1 g/dL — AB (ref 0.4–1.0)
B-Globulin SerPl Elph-Mcnc: 1 g/dL (ref 0.7–1.3)
GAMMA GLOB SERPL ELPH-MCNC: 0.6 g/dL (ref 0.4–1.8)
GLOBULIN, TOTAL: 2.8 g/dL (ref 2.2–3.9)
IGA: 146 mg/dL (ref 87–352)
IGG (IMMUNOGLOBIN G), SERUM: 655 mg/dL — AB (ref 700–1600)
IgM (Immunoglobulin M), Srm: 28 mg/dL (ref 26–217)
Total Protein ELP: 7.1 g/dL (ref 6.0–8.5)

## 2017-10-28 ENCOUNTER — Telehealth: Payer: Self-pay | Admitting: Internal Medicine

## 2017-10-28 ENCOUNTER — Encounter: Payer: Self-pay | Admitting: Internal Medicine

## 2017-10-28 ENCOUNTER — Inpatient Hospital Stay: Payer: BLUE CROSS/BLUE SHIELD | Admitting: Internal Medicine

## 2017-10-28 ENCOUNTER — Inpatient Hospital Stay: Payer: BLUE CROSS/BLUE SHIELD

## 2017-10-28 ENCOUNTER — Other Ambulatory Visit: Payer: Self-pay | Admitting: Internal Medicine

## 2017-10-28 DIAGNOSIS — N183 Chronic kidney disease, stage 3 (moderate): Secondary | ICD-10-CM

## 2017-10-28 DIAGNOSIS — Z79899 Other long term (current) drug therapy: Secondary | ICD-10-CM

## 2017-10-28 DIAGNOSIS — K529 Noninfective gastroenteritis and colitis, unspecified: Secondary | ICD-10-CM

## 2017-10-28 DIAGNOSIS — C9001 Multiple myeloma in remission: Secondary | ICD-10-CM

## 2017-10-28 DIAGNOSIS — K589 Irritable bowel syndrome without diarrhea: Secondary | ICD-10-CM

## 2017-10-28 DIAGNOSIS — I130 Hypertensive heart and chronic kidney disease with heart failure and stage 1 through stage 4 chronic kidney disease, or unspecified chronic kidney disease: Secondary | ICD-10-CM

## 2017-10-28 DIAGNOSIS — R252 Cramp and spasm: Secondary | ICD-10-CM | POA: Diagnosis not present

## 2017-10-28 DIAGNOSIS — Z7982 Long term (current) use of aspirin: Secondary | ICD-10-CM

## 2017-10-28 DIAGNOSIS — Z87891 Personal history of nicotine dependence: Secondary | ICD-10-CM

## 2017-10-28 DIAGNOSIS — G8929 Other chronic pain: Secondary | ICD-10-CM

## 2017-10-28 DIAGNOSIS — E1122 Type 2 diabetes mellitus with diabetic chronic kidney disease: Secondary | ICD-10-CM

## 2017-10-28 DIAGNOSIS — N189 Chronic kidney disease, unspecified: Secondary | ICD-10-CM

## 2017-10-28 DIAGNOSIS — M549 Dorsalgia, unspecified: Secondary | ICD-10-CM | POA: Diagnosis not present

## 2017-10-28 LAB — MAGNESIUM: Magnesium: 1.2 mg/dL — ABNORMAL LOW (ref 1.7–2.4)

## 2017-10-28 MED ORDER — MAGNESIUM SULFATE 4 GM/100ML IV SOLN
4.0000 g | Freq: Once | INTRAVENOUS | Status: AC
  Administered 2017-10-28: 4 g via INTRAVENOUS

## 2017-10-28 MED ORDER — SODIUM CHLORIDE 0.9 % IV SOLN
Freq: Once | INTRAVENOUS | Status: AC
  Administered 2017-10-28: 09:00:00 via INTRAVENOUS
  Filled 2017-10-28: qty 1000

## 2017-10-28 MED ORDER — MAGNESIUM SULFATE 4 GM/100ML IV SOLN
INTRAVENOUS | Status: AC
  Filled 2017-10-28: qty 100

## 2017-10-28 NOTE — Telephone Encounter (Signed)
Referral has been placed. Thanks!

## 2017-10-28 NOTE — Progress Notes (Signed)
[facility] NOTE  Patient Care Team: Glean Hess, MD as PCP - General (Internal Medicine) Cammie Sickle, MD as Consulting Physician (Oncology) Jodell Cipro, MD as Referring Physician (Pain Medicine) Corey Skains, MD as Consulting Physician (Cardiology)  CHIEF COMPLAINTS/PURPOSE OF CONSULTATION:  Multiple myeloma  # MULTIPLE MYELOMA-   Oncology History   # June 2017- [facility] MULTIPLE MYELOMA [BMBx- 80% plasma cells; [facility]]; Lamda light chain 1340; s/p RVD   # 14th DEC 2016- Auto-Stem cell transplant [[facility];Dr.Hertzig]; rev [dose reduced sec to [place]  # Maintenance Revlimid- 50m 3 w-ON & 1 week OFF; HELD July 2018- sec to ONJ  # Bone lesions-numerous ~552mlytic lesions in Thoracic spine- on Zometa  # 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  # "Colitis" s/p multiple EGD/Colonoscopies; EGD- patchy inflammation/colo- Nov 2018 [Dr.Anna]     [facility]    HISTORY OF PRESENTING ILLNESS:  Sarah [place]89.o.  female of her history of multiple myeloma diagnosed June 2016 currently on Surveillance [ since July 2018 maintenance Revlimid on hold]  Is here for follow-up.  Patient complains of cramping in her legs/hands for the last few weeks.  This seems to improve with IV magnesium; but not resolving.  She continues to take p.o. Magnesium-however this causes diarrhea.  Patient continues to have mild to moderate fatigue.  Chronic intermittent constipation/diarrhea currently stable.  Denies any chest pain or shortness of breath or cough.  ROS: A complete 10 point review of system is done which is negative except mentioned above in history of present illness  MEDICAL HISTORY:  Past Medical History:  Diagnosis Date   . Anemia   . Anxiety   . Arthritis   . Bicuspid aortic valve   . CHF (congestive heart failure) ([facility]  . CKD (chronic kidney disease) stage 3, GFR 30-59 ml/min (HCC)   . Depression   . Diabetes mellitus ([facility]  . Dizziness   . Fatty liver   . Frequent falls   . GERD (gastroesophageal reflux disease)   . Gout   . Heart murmur   . History of blood transfusion   . [place] of bone marrow transplant ([facility] [place]  . History of uterine fibroid   . Hypertension   . Hypomagnesemia   . Multiple myeloma ([facility]  . Personal history of chemotherapy   . Renal cyst     SURGICAL HISTORY: Past Surgical History:  Procedure Laterality Date  . ABDOMINAL HYSTERECTOMY    . 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 CARDIAC ELECTROPHYSIOLOGY MAPPING AND ABLATION    . CARPAL TUNNEL RELEASE Bilateral   . CHOLECYSTECTOMY  2008  . COLONOSCOPY WITH PROPOFOL N/A 05/07/2017   Procedure: COLONOSCOPY WITH PROPOFOL;  Surgeon: AnJonathon BellowsMD;  [facility];  Service: Gastroenterology;  Laterality: N/A;  . ESOPHAGOGASTRODUODENOSCOPY (EGD) WITH PROPOFOL N/A 05/07/2017   Procedure: ESOPHAGOGASTRODUODENOSCOPY (EGD) WITH PROPOFOL;  Surgeon: AnJonathon BellowsMD;  [facility];  Service: Gastroenterology;  Laterality: N/A;  . FOOT SURGERY Bilateral   . INCONTINENCE SURGERY  2009  . PARTIAL HYSTERECTOMY  03/1996   fibroids  . TONSILLECTOMY  2007   [place] quit smoking 93; occasional alcohol; lives [place] with step  sister.  Social History   Socioeconomic History  . Marital status: Widowed    Spouse name: Not on file  . Number of children: Not on file  . Years of education: Not on file  . Highest education level: Not on file  Occupational History  . Occupation: Disabled  Social Needs  . Financial resource strain: Not on file  . Food insecurity:    Worry: Not on file    Inability: Not on file  . Transportation needs:    Medical: Not on file     Non-medical: Not on file  Tobacco Use  . Smoking status: Former Smoker    Packs/day: 1.00    Years: 20.00    Pack years: 20.00    Types: Cigarettes    Last attempt to quit: 07/02/1991    Years since quitting: 26.3  . Smokeless tobacco: Never Used  Substance and Sexual Activity  . Alcohol use: Yes    Alcohol/week: 1.2 oz    Types: 1 Glasses of wine, 1 Cans of beer per week    Comment: occassional  . Drug use: No  . Sexual activity: Never  Lifestyle  . Physical activity:    Days per week: Not on file    Minutes per session: Not on file  . Stress: Not on file  Relationships  . Social connections:    Talks on phone: Not on file    Gets together: Not on file    Attends religious service: Not on file    Active member of club or organization: Not on file    Attends meetings of clubs or organizations: Not on file    Relationship status: Not on file  . Intimate partner violence:    Fear of current or ex partner: Not on file    Emotionally abused: Not on file    Physically abused: Not on file    Forced sexual activity: Not on file  Other Topics Concern  . Not on file  Social History Narrative  . Not on file    FAMILY HISTORY: Family History  Problem Relation Age of Onset  . Colon cancer Father   . Renal Disease Father   . Diabetes Mellitus II Father   . Melanoma Paternal Grandmother   . Breast cancer Maternal Aunt 25  . Anemia Mother   . Heart disease Mother   . Heart failure Mother   . Renal Disease Mother   . Congestive Heart Failure Mother   . Heart disease Maternal Uncle   . Throat cancer Maternal Uncle   . Lung cancer Maternal Uncle   . Liver disease Maternal Uncle   . Heart failure Maternal Uncle   . Hearing loss Son 18       Suicide     ALLERGIES:  is allergic to oxycodone-acetaminophen; codeine; benadryl [diphenhydramine]; morphine; ondansetron; and tylenol [acetaminophen].  MEDICATIONS:  Current Outpatient Medications  Medication Sig Dispense Refill   . acyclovir (ZOVIRAX) 200 MG capsule TAKE 2 CAPSULES(400 MG) BY MOUTH THREE TIMES DAILY 180 capsule 1  . allopurinol (ZYLOPRIM) 100 MG tablet TAKE 1 TABLET(100 MG) BY MOUTH DAILY as needed 30 tablet 5  . aspirin 81 MG chewable tablet Chew 1 tablet (81 mg total) by mouth daily. 30 tablet 0  . bisoprolol (ZEBETA) 10 MG tablet TAKE 1 TABLET(10 MG) BY MOUTH DAILY 30 tablet 5  . DULoxetine (CYMBALTA) 30 MG capsule Take 3 capsules (90 mg total) by mouth daily. 90 capsule 3  . fentaNYL (DURAGESIC -  DOSED MCG/HR) 25 MCG/HR patch Place 25 mcg onto the skin every 3 (three) days.    . fluticasone (FLONASE) 50 MCG/ACT nasal spray Place 2 sprays into both nostrils daily. 16 g 0  . furosemide (LASIX) 20 MG tablet Take 0.5 tablets (10 mg total) by mouth daily as needed. 30 tablet 0  . glucose blood (ONE TOUCH ULTRA TEST) test strip     . hydrOXYzine (ATARAX/VISTARIL) 10 MG tablet TAKE 1 TABLET(10 MG) BY MOUTH THREE TIMES DAILY AS NEEDED 90 tablet 0  . lisinopril (PRINIVIL,ZESTRIL) 10 MG tablet Take 10 mg by mouth daily.    . Menaquinone-7 (VITAMIN K2) 100 MCG CAPS Take by mouth.    . Multiple Minerals-Vitamins (CALCIUM-MAGNESIUM-ZINC-D3) TABS Take by mouth.    . Multiple Vitamins-Minerals (HAIR/SKIN/NAILS/BIOTIN PO) Take 1 capsule by mouth 3 (three) times daily.    . NON FORMULARY     . Omega 3-6-9 Fatty Acids (OMEGA 3-6-9 COMPLEX) CAPS Take by mouth.    Marland Kitchen omeprazole (PRILOSEC) 40 MG capsule Take 1 capsule (40 mg total) by mouth 2 (two) times daily. (Patient taking differently: Take 40 mg by mouth daily. ) 60 capsule 5  . pravastatin (PRAVACHOL) 20 MG tablet Take 1 tablet (20 mg total) by mouth daily. 30 tablet 3  . promethazine (PHENERGAN) 25 MG tablet TAKE 1 TO 2 TABLETS BY MOUTH TWICE DAILY BEFORE MEALS AS NEEDED 60 tablet 0  . tiZANidine (ZANAFLEX) 4 MG tablet TAKE 1 TABLET BY MOUTH EVERY 8 HOURS AS NEEDED, MUSCLE RELAXER FOR BACK PAIN 90 tablet 0  . Valerian Root 500 MG CAPS Take by mouth.    . zaleplon  (SONATA) 10 MG capsule TAKE ONE CAPSULE BY MOUTH EVERY DAY AT BEDTIME AS NEEDED FOR SLEEP 30 capsule 5  . cholestyramine (QUESTRAN) 4 g packet Take 4 g once daily. (Patient not taking: Reported on 10/28/2017) 60 each 3  . diphenoxylate-atropine (LOMOTIL) 2.5-0.025 MG tablet Take 2 tablets by mouth daily as needed for diarrhea or loose stools. (Patient not taking: Reported on 10/28/2017) 30 tablet 2  . fexofenadine (ALLEGRA) 180 MG tablet TAKE 1 TABLET(180 MG) BY MOUTH DAILY (Patient not taking: Reported on 10/28/2017) 30 tablet 5   No current facility-administered medications for this visit.    Facility-Administered Medications Ordered in Other Visits  Medication Dose Route Frequency Provider Last Rate Last Dose  . magnesium sulfate IVPB 4 g 100 mL  4 g Intravenous Once Cammie Sickle, MD 50 mL/hr at 10/28/17 0924 4 g at 10/28/17 [id number]    PHYSICAL EXAMINATION: ECOG PERFORMANCE STATUS: 1 - Symptomatic but completely ambulatory  There were no vitals filed for this visit. There were no vitals filed for this visit.  GENERAL: Well-nourished well-developed; Alert, no distress and comfortable.  EYES: no pallor or icterus OROPHARYNX: no thrush;  NECK: supple, no masses felt LYMPH:  no palpable lymphadenopathy in the cervical, axillary or inguinal regions LUNGS: clear to auscultation and  No wheeze or crackles HEART/CVS: regular rate & rhythm and no murmurs; No lower extremity edema ABDOMEN: abdomen soft, non-tender and normal bowel sounds Musculoskeletal:no cyanosis of digits and no clubbing  PSYCH: alert & oriented x 3 with fluent speech NEURO: no focal motor/sensory deficits SKIN:  no rashes or significant lesions  LABORATORY DATA:  I have reviewed the data as listed Lab Results  Component Value Date   WBC 7.5 10/21/2017   HGB 12.1 10/21/2017   HCT 34.8 (L) 10/21/2017   MCV 89.8 10/21/2017   PLT 134 (  L) 10/21/2017   Recent Labs    07/25/17 0948 07/26/17 0446 09/02/17 0843  10/21/17 0823  NA 137 137 136 139  K 4.3 4.5 4.4 4.7  CL 103 104 102 108  CO2 _0 21*  GLUCOSE 126* 134* 125* 135*  BUN 30* 27* 37* 51*  CREATININE 1.23* 1.02* 1.21* 1.29*  CALCIUM 10.3 9.4 9.2 8.9  GFRNONAA 47* 59* 48* 44*  GFRAA 54* >60 55* 51*  PROT 8.1  --  8.1 8.1  ALBUMIN 4.8  --  4.9 4.4  AST 32  --  33 27  ALT 35  --  34 24  ALKPHOS 104  --  183* 102  BILITOT 0.8  --  0.4 0.5    Results for KENDA, KLOEHN (MRN [id number]) as of 05/13/2017 09:40  Ref. Range 06/18/2016 11:15 09/24/2016 10:06 12/24/2016 09:37 03/18/2017 08:20 05/06/2017 08:42  Kappa free light chain Latest Ref Range: 3.3 - 19.4 mg/L 17.1 12.5 15.1 16.6 15.1  Lamda free light chains Latest Ref Range: 5.7 - 26.3 mg/L 15.8 14.6 19.3 20.0 21.0  Kappa, lamda light chain ratio Latest Ref Range: 0.26 - 1.65  1.08 0.86 0.78 0.83 0.72  M Protein SerPl Elph-Mcnc Latest Ref Range: Not Observed g/dL _1       RADIOGRAPHIC STUDIES: I have personally reviewed the radiological images as listed and agreed with the findings in the report. No results found.  ASSESSMENT & PLAN:   Multiple myeloma in remission ([place]) Multiple myeloma IgA lambda status post autologous stem cell transplant in December 2016 [[facility]].  HELD maintenance revlimid since July 2018 sec to ONJ  # April 2019- serum M protein absent/K-L light chains- normal. Clinically no evidence of progression at this time. Continue to HOLD Revlimid for now.  # ONJ- AUG 2018-[oral surgery at [place]. I would recommend discontinuation of Zometa at this time. Patient takes calcium and vitamin d currently.  # Nausea chronic on Phenergan 25 mg prn.-Currently stable.    # Chronic diarrhea alternating with constipation-  ? IBD;  Colestyramine prn.   #  Cramping of hand/ legs- ? Sec to low magHypomagnesemia- Mg 1.3 today.  Increase IV magnesium to 4 g; with weekly lab monitoring and replenishment as  needed.    # CKD- stage III  creatinine 1.3/BUN 51; patient concerned.  No evidence of active myeloma at this time. [will refer to neprhology]; recommend stopping acyclovir.  # Chronic back pain- pain management follow-up; on fentanyl  25 mc/ [Dr.James; [facility]; [place]]; no BTP.  # follow up in 2 months/myeloma panel; k-l light chains; weekly mag/infusion; nephrology referral.    Cammie Sickle, MD 10/28/2017 10:51 AM

## 2017-10-28 NOTE — Telephone Encounter (Signed)
Please make a referral to Dr. Holley Raring; nephrology re: CKD. Thx

## 2017-10-28 NOTE — Assessment & Plan Note (Addendum)
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  HELD maintenance revlimid since July 2018 sec to ONJ  # April 2019- serum M protein absent/K-L light chains- normal. Clinically no evidence of progression at this time. Continue to HOLD Revlimid for now.  # ONJ- AUG 2018-[oral surgery at [place]. I would recommend discontinuation of Zometa at this time. Patient takes calcium and vitamin d currently.  # Nausea chronic on Phenergan 25 mg prn.-Currently stable.    # Chronic diarrhea alternating with constipation-  ? IBD;  Colestyramine prn.   #  Cramping of hand/ legs- ? Sec to low magHypomagnesemia- Mg 1.3 today.  Increase IV magnesium to 4 g; with weekly lab monitoring and replenishment as needed.    # CKD- stage III  creatinine 1.3/BUN 51; patient concerned.  No evidence of active myeloma at this time. [will refer to neprhology]; recommend stopping acyclovir.  # Chronic back pain- pain management follow-up; on fentanyl  25 mc/ [Dr.James; [facility]; [place]]; no BTP.  # follow up in 2 months/myeloma panel; k-l light chains; weekly mag/infusion; nephrology referral.

## 2017-10-28 NOTE — Telephone Encounter (Signed)
I faxed over notes and labs and asked for an appt to DR Lateef's office.

## 2017-10-28 NOTE — Patient Instructions (Signed)
Magnesium Sulfate injection What is this medicine? MAGNESIUM SULFATE (mag NEE zee um SUL fate) is an electrolyte injection commonly used to treat low magnesium levels in your blood. It is also used to prevent or control seizures in women with preeclampsia or eclampsia. This medicine may be used for other purposes; ask your health care provider or pharmacist if you have questions. What should I tell my health care provider before I take this medicine? They need to know if you have any of these conditions: -heart disease -history of irregular heart beat -kidney disease -an unusual or allergic reaction to magnesium sulfate, medicines, foods, dyes, or preservatives -pregnant or trying to get pregnant -breast-feeding How should I use this medicine? This medicine is for infusion into a vein. It is given by a health care professional in a hospital or clinic setting. Talk to your pediatrician regarding the use of this medicine in children. While this drug may be prescribed for selected conditions, precautions do apply. Overdosage: If you think you have taken too much of this medicine contact a poison control center or emergency room at once. NOTE: This medicine is only for you. Do not share this medicine with others. What if I miss a dose? This does not apply. What may interact with this medicine? This medicine may interact with the following medications: -certain medicines for anxiety or sleep -certain medicines for seizures like phenobarbital -digoxin -medicines that relax muscles for surgery -narcotic medicines for pain This list may not describe all possible interactions. Give your health care provider a list of all the medicines, herbs, non-prescription drugs, or dietary supplements you use. Also tell them if you smoke, drink alcohol, or use illegal drugs. Some items may interact with your medicine. What should I watch for while using this medicine? Your condition will be monitored carefully  while you are receiving this medicine. You may need blood work done while you are receiving this medicine. What side effects may I notice from receiving this medicine? Side effects that you should report to your doctor or health care professional as soon as possible: -allergic reactions like skin rash, itching or hives, swelling of the face, lips, or tongue -facial flushing -muscle weakness -signs and symptoms of low blood pressure like dizziness; feeling faint or lightheaded, falls; unusually weak or tired -signs and symptoms of a dangerous change in heartbeat or heart rhythm like chest pain; dizziness; fast or irregular heartbeat; palpitations; breathing problems -sweating This list may not describe all possible side effects. Call your doctor for medical advice about side effects. You may report side effects to FDA at 1-800-FDA-1088. Where should I keep my medicine? This drug is given in a hospital or clinic and will not be stored at home. NOTE: This sheet is a summary. It may not cover all possible information. If you have questions about this medicine, talk to your doctor, pharmacist, or health care provider.  2018 Elsevier/Gold Standard (2016-01-03 12:31:42)  

## 2017-10-30 DIAGNOSIS — M25511 Pain in right shoulder: Secondary | ICD-10-CM | POA: Diagnosis not present

## 2017-11-03 DIAGNOSIS — M25511 Pain in right shoulder: Secondary | ICD-10-CM | POA: Diagnosis not present

## 2017-11-04 ENCOUNTER — Inpatient Hospital Stay: Payer: Medicare Other | Attending: Internal Medicine

## 2017-11-04 ENCOUNTER — Inpatient Hospital Stay: Payer: Medicare Other

## 2017-11-04 VITALS — BP 135/78 | HR 80 | Temp 98.2°F | Resp 18

## 2017-11-04 DIAGNOSIS — C9001 Multiple myeloma in remission: Secondary | ICD-10-CM

## 2017-11-04 LAB — MAGNESIUM: Magnesium: 1.2 mg/dL — ABNORMAL LOW (ref 1.7–2.4)

## 2017-11-04 MED ORDER — SODIUM CHLORIDE 0.9 % IV SOLN
Freq: Once | INTRAVENOUS | Status: AC
  Administered 2017-11-04: 14:00:00 via INTRAVENOUS
  Filled 2017-11-04: qty 1000

## 2017-11-04 MED ORDER — MAGNESIUM SULFATE 4 GM/100ML IV SOLN
4.0000 g | Freq: Once | INTRAVENOUS | Status: AC
  Administered 2017-11-04: 4 g via INTRAVENOUS

## 2017-11-04 NOTE — Patient Instructions (Signed)
Magnesium Hydroxide oral suspension What is this medicine? MAGNESIUM HYDROXIDE (mag NEE zhum hye DROX ide) is a laxative. It is used to treat constipation. This medicine may be used for other purposes; ask your health care provider or pharmacist if you have questions. COMMON BRAND NAME(S): Ex-Lax, Hardin Negus Milk of Magnesia What should I tell my health care provider before I take this medicine? They need to know if you have any of these conditions: -bowel, intestinal, or stomach disease -change in bowel habits for more than 14 days -kidney disease -low magnesium diet -nausea, vomiting -stomach pain or blockage -an unusual or allergic reaction to magnesium hydroxide, other medicines, foods, dyes, or preservatives -pregnant or trying to get pregnant -breast-feeding How should I use this medicine? Take this medicine by mouth. Follow the directions on the package or prescription label. Shake well before using. Use a specially marked spoon or dropper to measure each dose. Ask your pharmacist if you do not have one. Household spoons are not accurate. After taking this medicine, drink a full glass of water. Take your medicine at regular intervals. Do not take your medicine more often than directed. Talk to your pediatrician regarding the use of this medicine in children. While this medicine may be used in children for selected conditions, precautions do apply. Overdosage: If you think you have taken too much of this medicine contact a poison control center or emergency room at once. NOTE: This medicine is only for you. Do not share this medicine with others. What if I miss a dose? If you miss a dose, take it as soon as you can. If it is almost time for your next dose, take only that dose. Do not take double or extra doses. What may interact with this medicine? -antibiotics -delavirdine -gabapentin -lactulose -medicines for fungal infections like itraconazole and ketoconazole -medicines for  osteoporosis like alendronate, etidronate, risedronate and tiludronate -medicines for seizures like ethotoin and phenytoin -methenamine -other magnesium-containing antacids, laxatives or supplements -phenothiazines like chlorpromazine, mesoridazine, prochlorperazine, thioridazine -quinidine -rosuvastatin -sodium polystyrene sulfonate -sotalol -vitamin D This list may not describe all possible interactions. Give your health care provider a list of all the medicines, herbs, non-prescription drugs, or dietary supplements you use. Also tell them if you smoke, drink alcohol, or use illegal drugs. Some items may interact with your medicine. What should I watch for while using this medicine? Tell your doctor or healthcare professional if your symptoms do not start to get better or if they get worse. Do not treat yourself for constipation with this medicine for more than 1 week. See a doctor if you have black tarry stools, rectal bleeding, or if you feel unusually tired. Do not change to another laxative product without advice. If you are taking other medicines, leave an interval of at least 2 hours before or after taking this medicine. To help reduce constipation, drink several glasses of water a day. What side effects may I notice from receiving this medicine? Side effects that you should report to your doctor or health care professional as soon as possible: -allergic reactions like skin rash, itching or hives, swelling of the face, lips, or tongue -confusion -feeling faint or lightheaded, falls -loss of appetite -nausea, vomiting -rectal bleeding -unusually weak or tired Side effects that usually do not require medical attention (report to your doctor or health care professional if they continue or are bothersome): -chalky taste -diarrhea -stomach cramps This list may not describe all possible side effects. Call your doctor for medical advice  about side effects. You may report side effects to  FDA at 1-800-FDA-1088. Where should I keep my medicine? Keep out of the reach of children. Store at room temperature between 15 and 30 degrees C (59 and 86 degrees F). Do not freeze. Protect from light and moisture. Throw away any unused medicine after the expiration date. NOTE: This sheet is a summary. It may not cover all possible information. If you have questions about this medicine, talk to your doctor, pharmacist, or health care provider.  2018 Elsevier/Gold Standard (2008-01-04 14:54:13)

## 2017-11-06 DIAGNOSIS — M25511 Pain in right shoulder: Secondary | ICD-10-CM | POA: Diagnosis not present

## 2017-11-07 ENCOUNTER — Other Ambulatory Visit: Payer: Self-pay | Admitting: Internal Medicine

## 2017-11-11 ENCOUNTER — Inpatient Hospital Stay: Payer: Medicare Other

## 2017-11-11 ENCOUNTER — Other Ambulatory Visit: Payer: Self-pay

## 2017-11-11 VITALS — BP 138/81 | HR 79 | Temp 97.9°F | Resp 18

## 2017-11-11 DIAGNOSIS — C9001 Multiple myeloma in remission: Secondary | ICD-10-CM | POA: Diagnosis not present

## 2017-11-11 DIAGNOSIS — Z79891 Long term (current) use of opiate analgesic: Secondary | ICD-10-CM | POA: Diagnosis not present

## 2017-11-11 DIAGNOSIS — M533 Sacrococcygeal disorders, not elsewhere classified: Secondary | ICD-10-CM | POA: Diagnosis not present

## 2017-11-11 DIAGNOSIS — E1142 Type 2 diabetes mellitus with diabetic polyneuropathy: Secondary | ICD-10-CM | POA: Diagnosis not present

## 2017-11-11 DIAGNOSIS — M7062 Trochanteric bursitis, left hip: Secondary | ICD-10-CM | POA: Diagnosis not present

## 2017-11-11 DIAGNOSIS — Z6828 Body mass index (BMI) 28.0-28.9, adult: Secondary | ICD-10-CM | POA: Diagnosis not present

## 2017-11-11 DIAGNOSIS — M47816 Spondylosis without myelopathy or radiculopathy, lumbar region: Secondary | ICD-10-CM | POA: Diagnosis not present

## 2017-11-11 DIAGNOSIS — M47817 Spondylosis without myelopathy or radiculopathy, lumbosacral region: Secondary | ICD-10-CM | POA: Diagnosis not present

## 2017-11-11 LAB — MAGNESIUM: Magnesium: 1.2 mg/dL — ABNORMAL LOW (ref 1.7–2.4)

## 2017-11-11 MED ORDER — MAGNESIUM SULFATE 4 GM/100ML IV SOLN
4.0000 g | Freq: Once | INTRAVENOUS | Status: AC
  Administered 2017-11-11: 4 g via INTRAVENOUS
  Filled 2017-11-11: qty 100

## 2017-11-11 MED ORDER — SODIUM CHLORIDE 0.9 % IV SOLN
Freq: Once | INTRAVENOUS | Status: AC
  Administered 2017-11-11: 14:00:00 via INTRAVENOUS
  Filled 2017-11-11: qty 1000

## 2017-11-11 MED ORDER — PROMETHAZINE HCL 25 MG PO TABS: ORAL_TABLET | ORAL | 0 refills | Status: DC

## 2017-11-13 DIAGNOSIS — M25511 Pain in right shoulder: Secondary | ICD-10-CM | POA: Diagnosis not present

## 2017-11-18 ENCOUNTER — Inpatient Hospital Stay: Payer: Medicare Other

## 2017-11-18 VITALS — BP 114/70 | HR 75 | Temp 96.7°F | Resp 18

## 2017-11-18 DIAGNOSIS — C9001 Multiple myeloma in remission: Secondary | ICD-10-CM

## 2017-11-18 DIAGNOSIS — M25511 Pain in right shoulder: Secondary | ICD-10-CM | POA: Diagnosis not present

## 2017-11-18 LAB — MAGNESIUM: MAGNESIUM: 1.3 mg/dL — AB (ref 1.7–2.4)

## 2017-11-18 MED ORDER — SODIUM CHLORIDE 0.9 % IV SOLN
Freq: Once | INTRAVENOUS | Status: AC
  Administered 2017-11-18: 09:00:00 via INTRAVENOUS
  Filled 2017-11-18: qty 1000

## 2017-11-18 MED ORDER — MAGNESIUM SULFATE 4 GM/100ML IV SOLN
4.0000 g | Freq: Once | INTRAVENOUS | Status: AC
  Administered 2017-11-18: 4 g via INTRAVENOUS
  Filled 2017-11-18: qty 100

## 2017-11-18 NOTE — Patient Instructions (Signed)
Magnesium Sulfate injection What is this medicine? MAGNESIUM SULFATE (mag NEE zee um SUL fate) is an electrolyte injection commonly used to treat low magnesium levels in your blood. It is also used to prevent or control seizures in women with preeclampsia or eclampsia. This medicine may be used for other purposes; ask your health care provider or pharmacist if you have questions. What should I tell my health care provider before I take this medicine? They need to know if you have any of these conditions: -heart disease -history of irregular heart beat -kidney disease -an unusual or allergic reaction to magnesium sulfate, medicines, foods, dyes, or preservatives -pregnant or trying to get pregnant -breast-feeding How should I use this medicine? This medicine is for infusion into a vein. It is given by a health care professional in a hospital or clinic setting. Talk to your pediatrician regarding the use of this medicine in children. While this drug may be prescribed for selected conditions, precautions do apply. Overdosage: If you think you have taken too much of this medicine contact a poison control center or emergency room at once. NOTE: This medicine is only for you. Do not share this medicine with others. What if I miss a dose? This does not apply. What may interact with this medicine? This medicine may interact with the following medications: -certain medicines for anxiety or sleep -certain medicines for seizures like phenobarbital -digoxin -medicines that relax muscles for surgery -narcotic medicines for pain This list may not describe all possible interactions. Give your health care provider a list of all the medicines, herbs, non-prescription drugs, or dietary supplements you use. Also tell them if you smoke, drink alcohol, or use illegal drugs. Some items may interact with your medicine. What should I watch for while using this medicine? Your condition will be monitored carefully  while you are receiving this medicine. You may need blood work done while you are receiving this medicine. What side effects may I notice from receiving this medicine? Side effects that you should report to your doctor or health care professional as soon as possible: -allergic reactions like skin rash, itching or hives, swelling of the face, lips, or tongue -facial flushing -muscle weakness -signs and symptoms of low blood pressure like dizziness; feeling faint or lightheaded, falls; unusually weak or tired -signs and symptoms of a dangerous change in heartbeat or heart rhythm like chest pain; dizziness; fast or irregular heartbeat; palpitations; breathing problems -sweating This list may not describe all possible side effects. Call your doctor for medical advice about side effects. You may report side effects to FDA at 1-800-FDA-1088. Where should I keep my medicine? This drug is given in a hospital or clinic and will not be stored at home. NOTE: This sheet is a summary. It may not cover all possible information. If you have questions about this medicine, talk to your doctor, pharmacist, or health care provider.  2018 Elsevier/Gold Standard (2016-01-03 12:31:42)  

## 2017-11-18 NOTE — Progress Notes (Signed)
Patient reports that she has seen a "back" dr who "attributes the numbness in her feet to her prior chemotherapy and diabetes".  Patient also reports that her dog bit her finger.  Finger appears to be healing without any drainage or discoloration.

## 2017-11-20 DIAGNOSIS — M25511 Pain in right shoulder: Secondary | ICD-10-CM | POA: Diagnosis not present

## 2017-11-23 ENCOUNTER — Other Ambulatory Visit: Payer: Self-pay | Admitting: Internal Medicine

## 2017-11-25 ENCOUNTER — Inpatient Hospital Stay: Payer: Medicare Other

## 2017-11-25 VITALS — BP 135/77 | HR 85 | Resp 18

## 2017-11-25 DIAGNOSIS — C9001 Multiple myeloma in remission: Secondary | ICD-10-CM

## 2017-11-25 LAB — MAGNESIUM: Magnesium: 1.2 mg/dL — ABNORMAL LOW (ref 1.7–2.4)

## 2017-11-25 MED ORDER — MAGNESIUM SULFATE 4 GM/100ML IV SOLN
4.0000 g | Freq: Once | INTRAVENOUS | Status: AC
  Administered 2017-11-25: 4 g via INTRAVENOUS
  Filled 2017-11-25: qty 100

## 2017-11-25 MED ORDER — SODIUM CHLORIDE 0.9 % IV SOLN
Freq: Once | INTRAVENOUS | Status: AC
  Administered 2017-11-25: 09:00:00 via INTRAVENOUS
  Filled 2017-11-25: qty 1000

## 2017-11-25 NOTE — Patient Instructions (Signed)
Magnesium Sulfate injection What is this medicine? MAGNESIUM SULFATE (mag NEE zee um SUL fate) is an electrolyte injection commonly used to treat low magnesium levels in your blood. It is also used to prevent or control seizures in women with preeclampsia or eclampsia. This medicine may be used for other purposes; ask your health care provider or pharmacist if you have questions. What should I tell my health care provider before I take this medicine? They need to know if you have any of these conditions: -heart disease -history of irregular heart beat -kidney disease -an unusual or allergic reaction to magnesium sulfate, medicines, foods, dyes, or preservatives -pregnant or trying to get pregnant -breast-feeding How should I use this medicine? This medicine is for infusion into a vein. It is given by a health care professional in a hospital or clinic setting. Talk to your pediatrician regarding the use of this medicine in children. While this drug may be prescribed for selected conditions, precautions do apply. Overdosage: If you think you have taken too much of this medicine contact a poison control center or emergency room at once. NOTE: This medicine is only for you. Do not share this medicine with others. What if I miss a dose? This does not apply. What may interact with this medicine? This medicine may interact with the following medications: -certain medicines for anxiety or sleep -certain medicines for seizures like phenobarbital -digoxin -medicines that relax muscles for surgery -narcotic medicines for pain This list may not describe all possible interactions. Give your health care provider a list of all the medicines, herbs, non-prescription drugs, or dietary supplements you use. Also tell them if you smoke, drink alcohol, or use illegal drugs. Some items may interact with your medicine. What should I watch for while using this medicine? Your condition will be monitored carefully  while you are receiving this medicine. You may need blood work done while you are receiving this medicine. What side effects may I notice from receiving this medicine? Side effects that you should report to your doctor or health care professional as soon as possible: -allergic reactions like skin rash, itching or hives, swelling of the face, lips, or tongue -facial flushing -muscle weakness -signs and symptoms of low blood pressure like dizziness; feeling faint or lightheaded, falls; unusually weak or tired -signs and symptoms of a dangerous change in heartbeat or heart rhythm like chest pain; dizziness; fast or irregular heartbeat; palpitations; breathing problems -sweating This list may not describe all possible side effects. Call your doctor for medical advice about side effects. You may report side effects to FDA at 1-800-FDA-1088. Where should I keep my medicine? This drug is given in a hospital or clinic and will not be stored at home. NOTE: This sheet is a summary. It may not cover all possible information. If you have questions about this medicine, talk to your doctor, pharmacist, or health care provider.  2018 Elsevier/Gold Standard (2016-01-03 12:31:42)  

## 2017-11-27 DIAGNOSIS — M25511 Pain in right shoulder: Secondary | ICD-10-CM | POA: Diagnosis not present

## 2017-12-01 DIAGNOSIS — M533 Sacrococcygeal disorders, not elsewhere classified: Secondary | ICD-10-CM | POA: Diagnosis not present

## 2017-12-02 ENCOUNTER — Inpatient Hospital Stay: Payer: Medicare Other

## 2017-12-02 ENCOUNTER — Inpatient Hospital Stay: Payer: Medicare Other | Attending: Internal Medicine

## 2017-12-02 DIAGNOSIS — R197 Diarrhea, unspecified: Secondary | ICD-10-CM | POA: Insufficient documentation

## 2017-12-02 DIAGNOSIS — C9001 Multiple myeloma in remission: Secondary | ICD-10-CM | POA: Diagnosis not present

## 2017-12-02 DIAGNOSIS — Z9221 Personal history of antineoplastic chemotherapy: Secondary | ICD-10-CM | POA: Diagnosis not present

## 2017-12-02 DIAGNOSIS — M549 Dorsalgia, unspecified: Secondary | ICD-10-CM | POA: Insufficient documentation

## 2017-12-02 DIAGNOSIS — N183 Chronic kidney disease, stage 3 (moderate): Secondary | ICD-10-CM | POA: Diagnosis not present

## 2017-12-02 DIAGNOSIS — Z808 Family history of malignant neoplasm of other organs or systems: Secondary | ICD-10-CM | POA: Insufficient documentation

## 2017-12-02 DIAGNOSIS — Z79899 Other long term (current) drug therapy: Secondary | ICD-10-CM | POA: Insufficient documentation

## 2017-12-02 DIAGNOSIS — Z87891 Personal history of nicotine dependence: Secondary | ICD-10-CM | POA: Insufficient documentation

## 2017-12-02 DIAGNOSIS — Z7982 Long term (current) use of aspirin: Secondary | ICD-10-CM | POA: Insufficient documentation

## 2017-12-02 DIAGNOSIS — Z800 Family history of malignant neoplasm of digestive organs: Secondary | ICD-10-CM | POA: Insufficient documentation

## 2017-12-02 DIAGNOSIS — F419 Anxiety disorder, unspecified: Secondary | ICD-10-CM | POA: Diagnosis not present

## 2017-12-02 DIAGNOSIS — G8929 Other chronic pain: Secondary | ICD-10-CM | POA: Diagnosis not present

## 2017-12-02 LAB — MAGNESIUM: Magnesium: 1.2 mg/dL — ABNORMAL LOW (ref 1.7–2.4)

## 2017-12-02 MED ORDER — MAGNESIUM SULFATE 4 GM/100ML IV SOLN
4.0000 g | Freq: Once | INTRAVENOUS | Status: AC
  Administered 2017-12-02: 4 g via INTRAVENOUS
  Filled 2017-12-02: qty 100

## 2017-12-02 MED ORDER — SODIUM CHLORIDE 0.9 % IV SOLN
Freq: Once | INTRAVENOUS | Status: AC
  Administered 2017-12-02: 10:00:00 via INTRAVENOUS
  Filled 2017-12-02: qty 1000

## 2017-12-02 NOTE — Patient Instructions (Signed)
Magnesium Sulfate injection What is this medicine? MAGNESIUM SULFATE (mag NEE zee um SUL fate) is an electrolyte injection commonly used to treat low magnesium levels in your blood. It is also used to prevent or control seizures in women with preeclampsia or eclampsia. This medicine may be used for other purposes; ask your health care provider or pharmacist if you have questions. What should I tell my health care provider before I take this medicine? They need to know if you have any of these conditions: -heart disease -history of irregular heart beat -kidney disease -an unusual or allergic reaction to magnesium sulfate, medicines, foods, dyes, or preservatives -pregnant or trying to get pregnant -breast-feeding How should I use this medicine? This medicine is for infusion into a vein. It is given by a health care professional in a hospital or clinic setting. Talk to your pediatrician regarding the use of this medicine in children. While this drug may be prescribed for selected conditions, precautions do apply. Overdosage: If you think you have taken too much of this medicine contact a poison control center or emergency room at once. NOTE: This medicine is only for you. Do not share this medicine with others. What if I miss a dose? This does not apply. What may interact with this medicine? This medicine may interact with the following medications: -certain medicines for anxiety or sleep -certain medicines for seizures like phenobarbital -digoxin -medicines that relax muscles for surgery -narcotic medicines for pain This list may not describe all possible interactions. Give your health care provider a list of all the medicines, herbs, non-prescription drugs, or dietary supplements you use. Also tell them if you smoke, drink alcohol, or use illegal drugs. Some items may interact with your medicine. What should I watch for while using this medicine? Your condition will be monitored carefully  while you are receiving this medicine. You may need blood work done while you are receiving this medicine. What side effects may I notice from receiving this medicine? Side effects that you should report to your doctor or health care professional as soon as possible: -allergic reactions like skin rash, itching or hives, swelling of the face, lips, or tongue -facial flushing -muscle weakness -signs and symptoms of low blood pressure like dizziness; feeling faint or lightheaded, falls; unusually weak or tired -signs and symptoms of a dangerous change in heartbeat or heart rhythm like chest pain; dizziness; fast or irregular heartbeat; palpitations; breathing problems -sweating This list may not describe all possible side effects. Call your doctor for medical advice about side effects. You may report side effects to FDA at 1-800-FDA-1088. Where should I keep my medicine? This drug is given in a hospital or clinic and will not be stored at home. NOTE: This sheet is a summary. It may not cover all possible information. If you have questions about this medicine, talk to your doctor, pharmacist, or health care provider.  2018 Elsevier/Gold Standard (2016-01-03 12:31:42)  

## 2017-12-08 DIAGNOSIS — M545 Low back pain: Secondary | ICD-10-CM | POA: Diagnosis not present

## 2017-12-08 DIAGNOSIS — M25551 Pain in right hip: Secondary | ICD-10-CM | POA: Diagnosis not present

## 2017-12-08 DIAGNOSIS — M25552 Pain in left hip: Secondary | ICD-10-CM | POA: Diagnosis not present

## 2017-12-09 ENCOUNTER — Inpatient Hospital Stay: Payer: Medicare Other

## 2017-12-09 DIAGNOSIS — G8929 Other chronic pain: Secondary | ICD-10-CM | POA: Diagnosis not present

## 2017-12-09 DIAGNOSIS — C9001 Multiple myeloma in remission: Secondary | ICD-10-CM

## 2017-12-09 DIAGNOSIS — M545 Low back pain: Secondary | ICD-10-CM | POA: Diagnosis not present

## 2017-12-09 DIAGNOSIS — M549 Dorsalgia, unspecified: Secondary | ICD-10-CM | POA: Diagnosis not present

## 2017-12-09 DIAGNOSIS — M25552 Pain in left hip: Secondary | ICD-10-CM | POA: Diagnosis not present

## 2017-12-09 DIAGNOSIS — M25551 Pain in right hip: Secondary | ICD-10-CM | POA: Diagnosis not present

## 2017-12-09 DIAGNOSIS — R197 Diarrhea, unspecified: Secondary | ICD-10-CM | POA: Diagnosis not present

## 2017-12-09 DIAGNOSIS — N183 Chronic kidney disease, stage 3 (moderate): Secondary | ICD-10-CM | POA: Diagnosis not present

## 2017-12-09 LAB — MAGNESIUM: MAGNESIUM: 1.6 mg/dL — AB (ref 1.7–2.4)

## 2017-12-09 NOTE — Progress Notes (Signed)
Patient's magnesium level was 1.6 today.  After consulting with NP, patient was given the option to titrate to the high side of the normal range with a 2mg  IV infusion of magnesium or to hold infusion for today and to return next week.  Patient chose to skip today's dose and to return next week.

## 2017-12-12 ENCOUNTER — Telehealth: Payer: Self-pay | Admitting: *Deleted

## 2017-12-12 DIAGNOSIS — C9000 Multiple myeloma not having achieved remission: Secondary | ICD-10-CM | POA: Diagnosis not present

## 2017-12-12 DIAGNOSIS — I10 Essential (primary) hypertension: Secondary | ICD-10-CM | POA: Diagnosis not present

## 2017-12-12 DIAGNOSIS — N183 Chronic kidney disease, stage 3 (moderate): Secondary | ICD-10-CM | POA: Diagnosis not present

## 2017-12-12 NOTE — Telephone Encounter (Signed)
Patient requesting refill of promethazine, she is not on therapy for almost a year now, but has h/o colitis. Do you want to refill this for her.

## 2017-12-12 NOTE — Telephone Encounter (Signed)
Would wait until Dr. B gets back to discuss with him.  Thanks.

## 2017-12-15 DIAGNOSIS — M545 Low back pain: Secondary | ICD-10-CM | POA: Diagnosis not present

## 2017-12-15 DIAGNOSIS — M25552 Pain in left hip: Secondary | ICD-10-CM | POA: Diagnosis not present

## 2017-12-15 DIAGNOSIS — M25551 Pain in right hip: Secondary | ICD-10-CM | POA: Diagnosis not present

## 2017-12-16 ENCOUNTER — Inpatient Hospital Stay: Payer: Medicare Other

## 2017-12-16 VITALS — BP 118/70 | HR 66 | Temp 96.2°F | Resp 18

## 2017-12-16 DIAGNOSIS — N183 Chronic kidney disease, stage 3 (moderate): Secondary | ICD-10-CM | POA: Diagnosis not present

## 2017-12-16 DIAGNOSIS — G8929 Other chronic pain: Secondary | ICD-10-CM | POA: Diagnosis not present

## 2017-12-16 DIAGNOSIS — C9001 Multiple myeloma in remission: Secondary | ICD-10-CM

## 2017-12-16 DIAGNOSIS — R197 Diarrhea, unspecified: Secondary | ICD-10-CM | POA: Diagnosis not present

## 2017-12-16 DIAGNOSIS — M549 Dorsalgia, unspecified: Secondary | ICD-10-CM | POA: Diagnosis not present

## 2017-12-16 LAB — COMPREHENSIVE METABOLIC PANEL
ALT: 37 U/L (ref 14–54)
AST: 33 U/L (ref 15–41)
Albumin: 4.2 g/dL (ref 3.5–5.0)
Alkaline Phosphatase: 107 U/L (ref 38–126)
Anion gap: 13 (ref 5–15)
BUN: 35 mg/dL — AB (ref 6–20)
CHLORIDE: 104 mmol/L (ref 101–111)
CO2: 22 mmol/L (ref 22–32)
CREATININE: 1.17 mg/dL — AB (ref 0.44–1.00)
Calcium: 9.2 mg/dL (ref 8.9–10.3)
GFR, EST AFRICAN AMERICAN: 57 mL/min — AB (ref >60)
GFR, EST NON AFRICAN AMERICAN: 49 mL/min — AB (ref >60)
Glucose, Bld: 207 mg/dL — ABNORMAL HIGH (ref 65–99)
POTASSIUM: 4.4 mmol/L (ref 3.5–5.1)
SODIUM: 139 mmol/L (ref 135–145)
Total Bilirubin: 0.5 mg/dL (ref 0.3–1.2)
Total Protein: 7.3 g/dL (ref 6.5–8.1)

## 2017-12-16 LAB — CBC WITH DIFFERENTIAL/PLATELET
Basophils Absolute: 0 10*3/uL (ref 0–0.1)
Basophils Relative: 1 %
EOS ABS: 0.1 10*3/uL (ref 0–0.7)
Eosinophils Relative: 2 %
HCT: 35 % (ref 35.0–47.0)
Hemoglobin: 12 g/dL (ref 12.0–16.0)
LYMPHS ABS: 1.1 10*3/uL (ref 1.0–3.6)
LYMPHS PCT: 17 %
MCH: 31.2 pg (ref 26.0–34.0)
MCHC: 34.3 g/dL (ref 32.0–36.0)
MCV: 91 fL (ref 80.0–100.0)
MONOS PCT: 8 %
Monocytes Absolute: 0.5 10*3/uL (ref 0.2–0.9)
NEUTROS PCT: 72 %
Neutro Abs: 4.7 10*3/uL (ref 1.4–6.5)
Platelets: 112 10*3/uL — ABNORMAL LOW (ref 150–440)
RBC: 3.85 MIL/uL (ref 3.80–5.20)
RDW: 13.7 % (ref 11.5–14.5)
WBC: 6.5 10*3/uL (ref 3.6–11.0)

## 2017-12-16 LAB — MAGNESIUM: Magnesium: 1.1 mg/dL — ABNORMAL LOW (ref 1.7–2.4)

## 2017-12-16 MED ORDER — SODIUM CHLORIDE 0.9 % IV SOLN
Freq: Once | INTRAVENOUS | Status: AC
  Administered 2017-12-16: 09:00:00 via INTRAVENOUS
  Filled 2017-12-16: qty 1000

## 2017-12-16 MED ORDER — MAGNESIUM SULFATE 4 GM/100ML IV SOLN
4.0000 g | Freq: Once | INTRAVENOUS | Status: AC
  Administered 2017-12-16: 4 g via INTRAVENOUS
  Filled 2017-12-16: qty 100

## 2017-12-16 NOTE — Patient Instructions (Signed)
Magnesium Sulfate injection What is this medicine? MAGNESIUM SULFATE (mag NEE zee um SUL fate) is an electrolyte injection commonly used to treat low magnesium levels in your blood. It is also used to prevent or control seizures in women with preeclampsia or eclampsia. This medicine may be used for other purposes; ask your health care provider or pharmacist if you have questions. What should I tell my health care provider before I take this medicine? They need to know if you have any of these conditions: -heart disease -history of irregular heart beat -kidney disease -an unusual or allergic reaction to magnesium sulfate, medicines, foods, dyes, or preservatives -pregnant or trying to get pregnant -breast-feeding How should I use this medicine? This medicine is for infusion into a vein. It is given by a health care professional in a hospital or clinic setting. Talk to your pediatrician regarding the use of this medicine in children. While this drug may be prescribed for selected conditions, precautions do apply. Overdosage: If you think you have taken too much of this medicine contact a poison control center or emergency room at once. NOTE: This medicine is only for you. Do not share this medicine with others. What if I miss a dose? This does not apply. What may interact with this medicine? This medicine may interact with the following medications: -certain medicines for anxiety or sleep -certain medicines for seizures like phenobarbital -digoxin -medicines that relax muscles for surgery -narcotic medicines for pain This list may not describe all possible interactions. Give your health care provider a list of all the medicines, herbs, non-prescription drugs, or dietary supplements you use. Also tell them if you smoke, drink alcohol, or use illegal drugs. Some items may interact with your medicine. What should I watch for while using this medicine? Your condition will be monitored carefully  while you are receiving this medicine. You may need blood work done while you are receiving this medicine. What side effects may I notice from receiving this medicine? Side effects that you should report to your doctor or health care professional as soon as possible: -allergic reactions like skin rash, itching or hives, swelling of the face, lips, or tongue -facial flushing -muscle weakness -signs and symptoms of low blood pressure like dizziness; feeling faint or lightheaded, falls; unusually weak or tired -signs and symptoms of a dangerous change in heartbeat or heart rhythm like chest pain; dizziness; fast or irregular heartbeat; palpitations; breathing problems -sweating This list may not describe all possible side effects. Call your doctor for medical advice about side effects. You may report side effects to FDA at 1-800-FDA-1088. Where should I keep my medicine? This drug is given in a hospital or clinic and will not be stored at home. NOTE: This sheet is a summary. It may not cover all possible information. If you have questions about this medicine, talk to your doctor, pharmacist, or health care provider.  2018 Elsevier/Gold Standard (2016-01-03 12:31:42)  

## 2017-12-17 ENCOUNTER — Other Ambulatory Visit: Payer: Self-pay | Admitting: Nephrology

## 2017-12-17 DIAGNOSIS — N183 Chronic kidney disease, stage 3 unspecified: Secondary | ICD-10-CM

## 2017-12-17 LAB — KAPPA/LAMBDA LIGHT CHAINS
KAPPA, LAMDA LIGHT CHAIN RATIO: 0.41 (ref 0.26–1.65)
Kappa free light chain: 15.4 mg/L (ref 3.3–19.4)
Lambda free light chains: 37.4 mg/L — ABNORMAL HIGH (ref 5.7–26.3)

## 2017-12-17 LAB — MULTIPLE MYELOMA PANEL, SERUM
Albumin SerPl Elph-Mcnc: 4 g/dL (ref 2.9–4.4)
Albumin/Glob SerPl: 1.7 (ref 0.7–1.7)
Alpha 1: 0.2 g/dL (ref 0.0–0.4)
Alpha2 Glob SerPl Elph-Mcnc: 0.8 g/dL (ref 0.4–1.0)
B-Globulin SerPl Elph-Mcnc: 0.9 g/dL (ref 0.7–1.3)
Gamma Glob SerPl Elph-Mcnc: 0.5 g/dL (ref 0.4–1.8)
Globulin, Total: 2.4 g/dL (ref 2.2–3.9)
IGA: 144 mg/dL (ref 87–352)
IGG (IMMUNOGLOBIN G), SERUM: 605 mg/dL — AB (ref 700–1600)
IGM (IMMUNOGLOBULIN M), SRM: 31 mg/dL (ref 26–217)
TOTAL PROTEIN ELP: 6.4 g/dL (ref 6.0–8.5)

## 2017-12-22 DIAGNOSIS — M545 Low back pain: Secondary | ICD-10-CM | POA: Diagnosis not present

## 2017-12-22 DIAGNOSIS — M25552 Pain in left hip: Secondary | ICD-10-CM | POA: Diagnosis not present

## 2017-12-22 DIAGNOSIS — M25551 Pain in right hip: Secondary | ICD-10-CM | POA: Diagnosis not present

## 2017-12-23 ENCOUNTER — Inpatient Hospital Stay: Payer: Medicare Other | Admitting: Internal Medicine

## 2017-12-23 ENCOUNTER — Encounter: Payer: Self-pay | Admitting: Internal Medicine

## 2017-12-23 ENCOUNTER — Other Ambulatory Visit: Payer: Self-pay

## 2017-12-23 ENCOUNTER — Inpatient Hospital Stay: Payer: Medicare Other

## 2017-12-23 VITALS — BP 142/65 | HR 60 | Temp 98.8°F | Resp 18 | Ht 62.0 in | Wt 151.5 lb

## 2017-12-23 DIAGNOSIS — G8929 Other chronic pain: Secondary | ICD-10-CM

## 2017-12-23 DIAGNOSIS — Z800 Family history of malignant neoplasm of digestive organs: Secondary | ICD-10-CM

## 2017-12-23 DIAGNOSIS — M549 Dorsalgia, unspecified: Secondary | ICD-10-CM

## 2017-12-23 DIAGNOSIS — Z7982 Long term (current) use of aspirin: Secondary | ICD-10-CM | POA: Diagnosis not present

## 2017-12-23 DIAGNOSIS — Z808 Family history of malignant neoplasm of other organs or systems: Secondary | ICD-10-CM

## 2017-12-23 DIAGNOSIS — N183 Chronic kidney disease, stage 3 (moderate): Secondary | ICD-10-CM | POA: Diagnosis not present

## 2017-12-23 DIAGNOSIS — Z87891 Personal history of nicotine dependence: Secondary | ICD-10-CM | POA: Diagnosis not present

## 2017-12-23 DIAGNOSIS — R197 Diarrhea, unspecified: Secondary | ICD-10-CM | POA: Diagnosis not present

## 2017-12-23 DIAGNOSIS — F419 Anxiety disorder, unspecified: Secondary | ICD-10-CM | POA: Diagnosis not present

## 2017-12-23 DIAGNOSIS — C9001 Multiple myeloma in remission: Secondary | ICD-10-CM

## 2017-12-23 DIAGNOSIS — Z79899 Other long term (current) drug therapy: Secondary | ICD-10-CM | POA: Diagnosis not present

## 2017-12-23 DIAGNOSIS — Z9221 Personal history of antineoplastic chemotherapy: Secondary | ICD-10-CM

## 2017-12-23 LAB — MAGNESIUM: MAGNESIUM: 1.3 mg/dL — AB (ref 1.7–2.4)

## 2017-12-23 MED ORDER — MAGNESIUM SULFATE 4 GM/100ML IV SOLN
4.0000 g | Freq: Once | INTRAVENOUS | Status: AC
  Administered 2017-12-23: 4 g via INTRAVENOUS

## 2017-12-23 MED ORDER — PROMETHAZINE HCL 25 MG PO TABS: ORAL_TABLET | ORAL | 3 refills | Status: DC

## 2017-12-23 MED ORDER — SODIUM CHLORIDE 0.9 % IV SOLN
Freq: Once | INTRAVENOUS | Status: AC
  Administered 2017-12-23: 09:00:00 via INTRAVENOUS
  Filled 2017-12-23: qty 1000

## 2017-12-23 NOTE — Progress Notes (Signed)
[place] OFFICE PROGRESS NOTE  Patient Care Team: Sarah Hess, MD as PCP - General (Internal Medicine) Sarah Sickle, MD as Consulting Physician (Oncology) Sarah Cipro, MD as Referring Physician (Pain Medicine) Sarah Skains, MD as Consulting Physician (Cardiology)  Cancer Staging No matching staging information was found for the patient.   Oncology History   # June 2017- [facility] MULTIPLE MYELOMA [BMBx- 80% plasma cells; [name]]; Lamda light chain 1340; s/p RVD   # 14th DEC 2016- Auto-Stem cell transplant [[facility];Dr.Hertzig]; rev [dose reduced sec to Sarah [name]  # Maintenance Revlimid- 91m 3 w-ON & 1 week OFF; HELD July 2018- sec to ONJ  # Bone lesions-numerous ~567mlytic lesions in Thoracic spine- on Zometa  # 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  # "Colitis" s/p multiple EGD/Colonoscopies; EGD- patchy inflammation/colo- Nov 2018 [Dr.Anna]  DIAGNOSIS: Multiple myeloma  GOALS: Control/palliative  CURRENT/MOST RECENT THERAPY -surveillance      Multiple [place]oma in remission ([facility]     INTERVAL HISTORY:  [name] pleasant patient above history of multiple myeloma currently on surveillance is here for follow-up.  Patient continues to get magnesium infusions for her leg cramps/hypomagnesemia.    Patient has chronic intermittent diarrhea which is not any worse.  Review of Systems  Constitutional: Negative for chills, diaphoresis, fever, malaise/fatigue and weight loss.  HENT: Negative for nosebleeds and sore throat.   Eyes: Negative for double vision.  Respiratory: Negative for cough, hemoptysis, sputum production, shortness of breath and wheezing.   Cardiovascular: Negative for chest pain, palpitations,  orthopnea and leg swelling.  Gastrointestinal: Negative for abdominal pain, blood in stool, constipation, diarrhea, heartburn, melena, nausea and vomiting.  Genitourinary: Negative for dysuria, frequency and urgency.  Musculoskeletal: Positive for back pain, joint pain and myalgias.  Skin: Negative.  Negative for itching and rash.  Neurological: Negative for dizziness, tingling, focal weakness, weakness and headaches.  Endo/Heme/Allergies: Does not bruise/bleed easily.  Psychiatric/Behavioral: Negative for depression. The patient is not nervous/anxious and does not have insomnia.       PAST MEDICAL HISTORY :  Past Medical History:  Diagnosis Date  . Anemia   . Anxiety   . Arthritis   . Bicuspid aortic valve   . CHF (congestive heart failure) ([facility]  . CKD (chronic kidney disease) stage 3, GFR 30-59 ml/min (HCC)   . Depression   . Diabetes mellitus ([facility]  . Dizziness   . Fatty liver   . Frequent falls   . GERD (gastroesophageal reflux disease)   . Gout   . Heart murmur   . History of blood transfusion   . History of bone marrow transplant ([facility]  . History of uterine fibroid   . Hypertension   . Hypomagnesemia   . Multiple myeloma ([facility]  . Personal history of chemotherapy   . Renal cyst     PAST SURGICAL HISTORY :   Past Surgical History:  Procedure Laterality Date  . ABDOMINAL HYSTERECTOMY    . 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 CARDIAC ELECTROPHYSIOLOGY MAPPING AND ABLATION    . CARPAL TUNNEL RELEASE Bilateral   . CHOLECYSTECTOMY  2008  . COLONOSCOPY WITH PROPOFOL N/A 05/07/2017   Procedure: COLONOSCOPY WITH PROPOFOL;  Surgeon: AnJonathon BellowsMD;  Location: [facility] ENDOSCOPY;  Service: Gastroenterology;  Laterality: N/A;  . ESOPHAGOGASTRODUODENOSCOPY (EGD) WITH PROPOFOL N/A 05/07/2017   Procedure: ESOPHAGOGASTRODUODENOSCOPY (EGD) WITH PROPOFOL;  Surgeon: Sarah Bellows, MD;  Location: [facility] ENDOSCOPY;  Service: Gastroenterology;  Laterality: N/A;  . FOOT SURGERY Bilateral    . INCONTINENCE SURGERY  2009  . PARTIAL HYSTERECTOMY  03/1996   fibroids  . TONSILLECTOMY  2007    FAMILY HISTORY :   Family History  Problem Relation Age of Onset  . Colon cancer Father   . Renal Disease Father   . Diabetes Mellitus II Father   . Melanoma Paternal Grandmother   . Breast cancer Maternal Aunt 71  . Anemia Mother   . Heart disease Mother   . Heart failure Mother   . Renal Disease Mother   . Congestive Heart Failure Mother   . Heart disease Maternal Uncle   . Throat cancer Maternal Uncle   . Lung cancer Maternal Uncle   . Liver disease Maternal Uncle   . Heart failure Maternal Uncle   . Hearing loss Son 23       Suicide     SOCIAL HISTORY:   Social History   Tobacco Use  . Smoking status: Former Smoker    Packs/day: 1.00    Years: 20.00    Pack years: 20.00    Types: Cigarettes    Last attempt to quit: 07/02/1991    Years since quitting: 26.5  . Smokeless tobacco: Never Used  Substance Use Topics  . Alcohol use: Yes    Alcohol/week: 1.2 oz    Types: 1 Glasses of wine, 1 Cans of beer per week    Comment: occassional  . Drug use: No    ALLERGIES:  is allergic to oxycodone-acetaminophen; codeine; benadryl [diphenhydramine]; morphine; ondansetron; and tylenol [acetaminophen].  MEDICATIONS:  Current Outpatient Medications  Medication Sig Dispense Refill  . allopurinol (ZYLOPRIM) 100 MG tablet TAKE 1 TABLET(100 MG) BY MOUTH DAILY as needed 30 tablet 5  . aspirin 81 MG chewable tablet Chew 1 tablet (81 mg total) by mouth daily. 30 tablet 0  . bisoprolol (ZEBETA) 10 MG tablet TAKE 1 TABLET(10 MG) BY MOUTH DAILY 30 tablet 5  . DULoxetine (CYMBALTA) 30 MG capsule Take 3 capsules (90 mg total) by mouth daily. 90 capsule 3  . fentaNYL (DURAGESIC - DOSED MCG/HR) 25 MCG/HR patch Place 25 mcg onto the skin every 3 (three) days.    . fluticasone (FLONASE) 50 MCG/ACT nasal spray Place 2 sprays into both nostrils daily. 16 g 0  . glucose blood (ONE TOUCH ULTRA  TEST) test strip     . lisinopril (PRINIVIL,ZESTRIL) 10 MG tablet Take 10 mg by mouth daily.    . NON FORMULARY     . omeprazole (PRILOSEC) 40 MG capsule Take 1 capsule (40 mg total) by mouth 2 (two) times daily. (Patient taking differently: Take 40 mg by mouth daily. ) 60 capsule 5  . pravastatin (PRAVACHOL) 20 MG tablet Take 1 tablet (20 mg total) by mouth daily. 30 tablet 3  . pregabalin (LYRICA) 50 MG capsule Take 100 mg by mouth daily.    . promethazine (PHENERGAN) 25 MG tablet Take 1-2 tablets by mouth twice daily before meals as needed 60 tablet 3  . tiZANidine (ZANAFLEX) 4 MG tablet TAKE 1 TABLET BY MOUTH EVERY 8 HOURS AS NEEDED, MUSCLE RELAXER FOR BACK PAIN 90 tablet 5  . Valerian Root 500 MG CAPS Take 1 capsule by mouth daily.     Marland Kitchen  diphenoxylate-atropine (LOMOTIL) 2.5-0.025 MG tablet Take 2 tablets by mouth daily as needed for diarrhea or loose stools. (Patient not taking: Reported on 10/28/2017) 30 tablet 2  . fexofenadine (ALLEGRA) 180 MG tablet TAKE 1 TABLET(180 MG) BY MOUTH DAILY (Patient not taking: Reported on 10/28/2017) 30 tablet 5  . hydrOXYzine (ATARAX/VISTARIL) 10 MG tablet TAKE 1 TABLET(10 MG) BY MOUTH THREE TIMES DAILY AS NEEDED (Patient not taking: Reported on 12/23/2017) 90 tablet 0  . Menaquinone-7 (VITAMIN K2) 100 MCG CAPS Take by mouth.    . Multiple Vitamins-Minerals (HAIR/SKIN/NAILS/BIOTIN PO) Take 1 capsule by mouth 3 (three) times daily.    . Omega 3-6-9 Fatty Acids (OMEGA 3-6-9 COMPLEX) CAPS Take by mouth.    . zaleplon (SONATA) 10 MG capsule TAKE ONE CAPSULE BY MOUTH EVERY DAY AT BEDTIME AS NEEDED FOR SLEEP 30 capsule 5   No current facility-administered medications for this visit.     PHYSICAL EXAMINATION: ECOG PERFORMANCE STATUS: 1 - Symptomatic but completely ambulatory  BP (!) 142/65 (Patient Position: Sitting)   Pulse 60   Temp 98.8 F (37.1 C) (Oral)   Resp 18   Ht _0  (1.575 m)   Wt 151 lb 8 oz (68.7 kg)   BMI 27.71 kg/m   Filed Weights    12/23/17 0846  Weight: 151 lb 8 oz (68.7 kg)    GENERAL: Well-nourished well-developed; Alert, no distress and comfortable.  Alone. EYES: no pallor or icterus OROPHARYNX: no thrush or ulceration; NECK: supple; no lymph nodes felt. LYMPH:  no palpable lymphadenopathy in the axillary or inguinal regions LUNGS: Decreased breath sounds auscultation bilaterally. No wheeze or crackles HEART/CVS: regular rate & rhythm and no murmurs; No lower extremity edema ABDOMEN:abdomen soft, non-tender and normal bowel sounds. No hepatomegaly or splenomegaly.  Musculoskeletal:no cyanosis of digits and no clubbing  PSYCH: alert & oriented x 3 with fluent speech NEURO: no focal motor/sensory deficits SKIN:  no rashes or significant lesions    LABORATORY DATA:  I have reviewed the data as listed    Component Value Date/Time   NA 139 12/16/2017 0839   NA 141 04/19/2016 1604   K 4.4 12/16/2017 0839   CL 104 12/16/2017 0839   CO2 22 12/16/2017 0839   GLUCOSE 207 (H) 12/16/2017 0839   BUN 35 (H) 12/16/2017 0839   BUN 22 04/19/2016 1604   CREATININE 1.17 (H) 12/16/2017 0839   CALCIUM 9.2 12/16/2017 0839   PROT 7.3 12/16/2017 0839   ALBUMIN 4.2 12/16/2017 0839   AST 33 12/16/2017 0839   ALT 37 12/16/2017 0839   ALKPHOS 107 12/16/2017 0839   BILITOT 0.5 12/16/2017 0839   GFRNONAA 49 (L) 12/16/2017 0839   GFRAA 57 (L) 12/16/2017 0839    No results found for: SPEP, UPEP  Lab Results  Component Value Date   WBC 6.5 12/16/2017   NEUTROABS 4.7 12/16/2017   HGB 12.0 12/16/2017   HCT 35.0 12/16/2017   MCV 91.0 12/16/2017   PLT 112 (L) 12/16/2017      Chemistry      Component Value Date/Time   NA 139 12/16/2017 0839   NA 141 04/19/2016 1604   K 4.4 12/16/2017 0839   CL 104 12/16/2017 0839   CO2 22 12/16/2017 0839   BUN 35 (H) 12/16/2017 0839   BUN 22 04/19/2016 1604   CREATININE 1.17 (H) 12/16/2017 0839      Component Value Date/Time   CALCIUM 9.2 12/16/2017 0839   ALKPHOS 107  12/16/2017 0839   AST  33 12/16/2017 0839   ALT 37 12/16/2017 0839   BILITOT 0.5 12/16/2017 0839       RADIOGRAPHIC STUDIES: I have personally reviewed the radiological images as listed and agreed with the findings in the report. No results found.   ASSESSMENT & PLAN:  Multiple myeloma in remission (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  Currently on surveillance.  #Clinically stable.  June 2019 M protein no evidence of myeloma.  #ONJ August 2018-continue to hold bisphosphonate.  # Nausea chronic on Phenergan 25 mg prn.-Currently stable.   # Hypomagnesia- [symptomatic- palipi/cramps- ] stable.  Cont mag 4 gm weekly  # Chronic diarrhea alternating with constipation-  ? IBD; stable.  Colestyramine prn.   #Chronic CKD stage III-followed by Dr. Zollie Scale..  Stable.  #Chronic back pain-followed by pain clinic; stable.   # follow up in 2 months/myeloma panel; k-l light chains; weekly mag/infusion;    Orders Placed This Encounter  Procedures  . CBC with Differential/Platelet    Standing Status:   Future    Standing Expiration Date:   12/24/2018  . Comprehensive metabolic panel    Standing Status:   Future    Standing Expiration Date:   12/24/2018  . Kappa/lambda light chains    Standing Status:   Future    Standing Expiration Date:   12/24/2018  . Multiple Myeloma Panel (SPEP&IFE w/QIG)    Standing Status:   Future    Standing Expiration Date:   12/24/2018  . Magnesium    Standing Status:   Future    Standing Expiration Date:   12/23/2018   All questions were answered. The patient knows to call the clinic with any problems, questions or concerns.      Sarah Sickle, MD 12/28/2017 2:13 PM

## 2017-12-23 NOTE — Patient Instructions (Signed)
Magnesium Sulfate injection What is this medicine? MAGNESIUM SULFATE (mag NEE zee um SUL fate) is an electrolyte injection commonly used to treat low magnesium levels in your blood. It is also used to prevent or control seizures in women with preeclampsia or eclampsia. This medicine may be used for other purposes; ask your health care provider or pharmacist if you have questions. What should I tell my health care provider before I take this medicine? They need to know if you have any of these conditions: -heart disease -history of irregular heart beat -kidney disease -an unusual or allergic reaction to magnesium sulfate, medicines, foods, dyes, or preservatives -pregnant or trying to get pregnant -breast-feeding How should I use this medicine? This medicine is for infusion into a vein. It is given by a health care professional in a hospital or clinic setting. Talk to your pediatrician regarding the use of this medicine in children. While this drug may be prescribed for selected conditions, precautions do apply. Overdosage: If you think you have taken too much of this medicine contact a poison control center or emergency room at once. NOTE: This medicine is only for you. Do not share this medicine with others. What if I miss a dose? This does not apply. What may interact with this medicine? This medicine may interact with the following medications: -certain medicines for anxiety or sleep -certain medicines for seizures like phenobarbital -digoxin -medicines that relax muscles for surgery -narcotic medicines for pain This list may not describe all possible interactions. Give your health care provider a list of all the medicines, herbs, non-prescription drugs, or dietary supplements you use. Also tell them if you smoke, drink alcohol, or use illegal drugs. Some items may interact with your medicine. What should I watch for while using this medicine? Your condition will be monitored carefully  while you are receiving this medicine. You may need blood work done while you are receiving this medicine. What side effects may I notice from receiving this medicine? Side effects that you should report to your doctor or health care professional as soon as possible: -allergic reactions like skin rash, itching or hives, swelling of the face, lips, or tongue -facial flushing -muscle weakness -signs and symptoms of low blood pressure like dizziness; feeling faint or lightheaded, falls; unusually weak or tired -signs and symptoms of a dangerous change in heartbeat or heart rhythm like chest pain; dizziness; fast or irregular heartbeat; palpitations; breathing problems -sweating This list may not describe all possible side effects. Call your doctor for medical advice about side effects. You may report side effects to FDA at 1-800-FDA-1088. Where should I keep my medicine? This drug is given in a hospital or clinic and will not be stored at home. NOTE: This sheet is a summary. It may not cover all possible information. If you have questions about this medicine, talk to your doctor, pharmacist, or health care provider.  2018 Elsevier/Gold Standard (2016-01-03 12:31:42)  

## 2017-12-23 NOTE — Assessment & Plan Note (Addendum)
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  Currently on surveillance.  #Clinically stable.  June 2019 M protein no evidence of myeloma.  #ONJ August 2018-continue to hold bisphosphonate.  # Nausea chronic on Phenergan 25 mg prn.-Currently stable.   # Hypomagnesia- [symptomatic- palipi/cramps- ] stable.  Cont mag 4 gm weekly  # Chronic diarrhea alternating with constipation-  ? IBD; stable.  Colestyramine prn.   #Chronic CKD stage III-followed by Dr. Zollie Scale..  Stable.  #Chronic back pain-followed by pain clinic; stable.   # follow up in 2 months/myeloma panel; k-l light chains; weekly mag/infusion;

## 2017-12-24 ENCOUNTER — Ambulatory Visit
Admission: RE | Admit: 2017-12-24 | Discharge: 2017-12-24 | Disposition: A | Discharge status: Home / Self Care | Payer: Medicare Other | Source: Ambulatory Visit | Attending: Nephrology | Admitting: Nephrology

## 2017-12-24 DIAGNOSIS — N183 Chronic kidney disease, stage 3 unspecified: Secondary | ICD-10-CM

## 2017-12-25 DIAGNOSIS — M25551 Pain in right hip: Secondary | ICD-10-CM | POA: Diagnosis not present

## 2017-12-25 DIAGNOSIS — M25552 Pain in left hip: Secondary | ICD-10-CM | POA: Diagnosis not present

## 2017-12-25 DIAGNOSIS — M545 Low back pain: Secondary | ICD-10-CM | POA: Diagnosis not present

## 2017-12-29 DIAGNOSIS — M25552 Pain in left hip: Secondary | ICD-10-CM | POA: Diagnosis not present

## 2017-12-29 DIAGNOSIS — M545 Low back pain: Secondary | ICD-10-CM | POA: Diagnosis not present

## 2017-12-29 DIAGNOSIS — M25551 Pain in right hip: Secondary | ICD-10-CM | POA: Diagnosis not present

## 2017-12-30 ENCOUNTER — Inpatient Hospital Stay: Payer: Medicare Other

## 2017-12-31 ENCOUNTER — Other Ambulatory Visit: Payer: Self-pay | Admitting: Internal Medicine

## 2017-12-31 DIAGNOSIS — K219 Gastro-esophageal reflux disease without esophagitis: Secondary | ICD-10-CM

## 2018-01-05 DIAGNOSIS — M25552 Pain in left hip: Secondary | ICD-10-CM | POA: Diagnosis not present

## 2018-01-05 DIAGNOSIS — M545 Low back pain: Secondary | ICD-10-CM | POA: Diagnosis not present

## 2018-01-05 DIAGNOSIS — M25551 Pain in right hip: Secondary | ICD-10-CM | POA: Diagnosis not present

## 2018-01-06 ENCOUNTER — Inpatient Hospital Stay: Payer: Medicare Other

## 2018-01-06 ENCOUNTER — Inpatient Hospital Stay: Payer: Medicare Other | Attending: Internal Medicine

## 2018-01-06 VITALS — BP 133/71 | HR 77 | Temp 95.9°F | Resp 18

## 2018-01-06 DIAGNOSIS — C9001 Multiple myeloma in remission: Secondary | ICD-10-CM | POA: Insufficient documentation

## 2018-01-06 LAB — MAGNESIUM: MAGNESIUM: 1.1 mg/dL — AB (ref 1.7–2.4)

## 2018-01-06 MED ORDER — SODIUM CHLORIDE 0.9 % IV SOLN
Freq: Once | INTRAVENOUS | Status: AC
  Administered 2018-01-06: 09:00:00 via INTRAVENOUS
  Filled 2018-01-06: qty 1000

## 2018-01-06 MED ORDER — MAGNESIUM SULFATE 4 GM/100ML IV SOLN
4.0000 g | Freq: Once | INTRAVENOUS | Status: AC
  Administered 2018-01-06: 4 g via INTRAVENOUS
  Filled 2018-01-06: qty 100

## 2018-01-06 NOTE — Patient Instructions (Signed)
Magnesium Sulfate injection What is this medicine? MAGNESIUM SULFATE (mag NEE zee um SUL fate) is an electrolyte injection commonly used to treat low magnesium levels in your blood. It is also used to prevent or control seizures in women with preeclampsia or eclampsia. This medicine may be used for other purposes; ask your health care provider or pharmacist if you have questions. What should I tell my health care provider before I take this medicine? They need to know if you have any of these conditions: -heart disease -history of irregular heart beat -kidney disease -an unusual or allergic reaction to magnesium sulfate, medicines, foods, dyes, or preservatives -pregnant or trying to get pregnant -breast-feeding How should I use this medicine? This medicine is for infusion into a vein. It is given by a health care professional in a hospital or clinic setting. Talk to your pediatrician regarding the use of this medicine in children. While this drug may be prescribed for selected conditions, precautions do apply. Overdosage: If you think you have taken too much of this medicine contact a poison control center or emergency room at once. NOTE: This medicine is only for you. Do not share this medicine with others. What if I miss a dose? This does not apply. What may interact with this medicine? This medicine may interact with the following medications: -certain medicines for anxiety or sleep -certain medicines for seizures like phenobarbital -digoxin -medicines that relax muscles for surgery -narcotic medicines for pain This list may not describe all possible interactions. Give your health care provider a list of all the medicines, herbs, non-prescription drugs, or dietary supplements you use. Also tell them if you smoke, drink alcohol, or use illegal drugs. Some items may interact with your medicine. What should I watch for while using this medicine? Your condition will be monitored carefully  while you are receiving this medicine. You may need blood work done while you are receiving this medicine. What side effects may I notice from receiving this medicine? Side effects that you should report to your doctor or health care professional as soon as possible: -allergic reactions like skin rash, itching or hives, swelling of the face, lips, or tongue -facial flushing -muscle weakness -signs and symptoms of low blood pressure like dizziness; feeling faint or lightheaded, falls; unusually weak or tired -signs and symptoms of a dangerous change in heartbeat or heart rhythm like chest pain; dizziness; fast or irregular heartbeat; palpitations; breathing problems -sweating This list may not describe all possible side effects. Call your doctor for medical advice about side effects. You may report side effects to FDA at 1-800-FDA-1088. Where should I keep my medicine? This drug is given in a hospital or clinic and will not be stored at home. NOTE: This sheet is a summary. It may not cover all possible information. If you have questions about this medicine, talk to your doctor, pharmacist, or health care provider.  2018 Elsevier/Gold Standard (2016-01-03 12:31:42)  

## 2018-01-09 DIAGNOSIS — M25551 Pain in right hip: Secondary | ICD-10-CM | POA: Diagnosis not present

## 2018-01-09 DIAGNOSIS — M25552 Pain in left hip: Secondary | ICD-10-CM | POA: Diagnosis not present

## 2018-01-09 DIAGNOSIS — M545 Low back pain: Secondary | ICD-10-CM | POA: Diagnosis not present

## 2018-01-13 ENCOUNTER — Inpatient Hospital Stay: Payer: Medicare Other

## 2018-01-13 VITALS — BP 132/72 | HR 71 | Temp 95.5°F | Resp 18

## 2018-01-13 DIAGNOSIS — C9001 Multiple myeloma in remission: Secondary | ICD-10-CM

## 2018-01-13 DIAGNOSIS — M25551 Pain in right hip: Secondary | ICD-10-CM | POA: Diagnosis not present

## 2018-01-13 DIAGNOSIS — M545 Low back pain: Secondary | ICD-10-CM | POA: Diagnosis not present

## 2018-01-13 DIAGNOSIS — M25552 Pain in left hip: Secondary | ICD-10-CM | POA: Diagnosis not present

## 2018-01-13 LAB — MAGNESIUM: MAGNESIUM: 1.1 mg/dL — AB (ref 1.7–2.4)

## 2018-01-13 MED ORDER — SODIUM CHLORIDE 0.9 % IV SOLN
Freq: Once | INTRAVENOUS | Status: AC
  Administered 2018-01-13: 09:00:00 via INTRAVENOUS
  Filled 2018-01-13: qty 1000

## 2018-01-13 MED ORDER — MAGNESIUM SULFATE 4 GM/100ML IV SOLN
4.0000 g | Freq: Once | INTRAVENOUS | Status: AC
  Administered 2018-01-13: 4 g via INTRAVENOUS

## 2018-01-13 NOTE — Patient Instructions (Signed)
Hypomagnesemia  Hypomagnesemia is a condition in which the level of magnesium in the blood is low. Magnesium is a mineral that is found in many foods. It is used in many different processes in the body. Hypomagnesemia can affect every organ in the body. It can cause life-threatening problems.  What are the causes?  Causes of hypomagnesemia include:   Not getting enough magnesium in your diet.   Malnutrition.   Problems with absorbing magnesium from the intestines.   Dehydration.   Alcohol abuse.   Vomiting.   Severe diarrhea.   Some medicines, including medicines that make you urinate more.   Certain diseases, such as kidney disease, diabetes, and overactive thyroid.    What are the signs or symptoms?   Involuntary shaking or trembling of a body part (tremor).   Confusion.   Muscle weakness.   Sensitivity to light, sound, and touch.   Psychiatric issues, such as depression, irritability, or psychosis.   Sudden tightening of muscles (muscle spasms).   Tingling in the arms and legs.   A feeling of fluttering of the heart.  These symptoms are more severe if magnesium levels drop suddenly.  How is this diagnosed?  To make a diagnosis, your health care provider will do a physical exam and order blood and urine tests.  How is this treated?  Treatment will depend on the cause and the severity of your condition. It may involve:   A magnesium supplement. This can be taken in pill form. It can also be given through an IV tube. This is usually done if the condition is severe.   Changes to your diet. You may be directed to eat foods that have a lot of magnesium, such as green leafy vegetables, peas, beans, and nuts.   Eliminating alcohol from your diet.    Follow these instructions at home:   Include foods with magnesium in your diet. Foods that are rich in magnesium include green vegetables, beans, nuts and seeds, and whole grains.   Take medicines only as directed by your health care provider.   Take  magnesium supplements if your health care provider instructs you to do that. Take them as directed.   Have your magnesium levels monitored as directed by your health care provider.   When you are active, drink fluids that contain electrolytes.   Keep all follow-up visits as directed by your health care provider. This is important.  Contact a health care provider if:   You get worse instead of better.   Your symptoms return.  Get help right away if:   Your symptoms are severe.  This information is not intended to replace advice given to you by your health care provider. Make sure you discuss any questions you have with your health care provider.  Document Released: 03/13/2005 Document Revised: 11/23/2015 Document Reviewed: 01/31/2014  Elsevier Interactive Patient Education  2018 [employer]vier [employer]

## 2018-01-15 DIAGNOSIS — N183 Chronic kidney disease, stage 3 (moderate): Secondary | ICD-10-CM | POA: Diagnosis not present

## 2018-01-15 DIAGNOSIS — C9000 Multiple myeloma not having achieved remission: Secondary | ICD-10-CM | POA: Diagnosis not present

## 2018-01-15 DIAGNOSIS — E1122 Type 2 diabetes mellitus with diabetic chronic kidney disease: Secondary | ICD-10-CM | POA: Diagnosis not present

## 2018-01-15 DIAGNOSIS — I10 Essential (primary) hypertension: Secondary | ICD-10-CM | POA: Diagnosis not present

## 2018-01-19 DIAGNOSIS — M545 Low back pain: Secondary | ICD-10-CM | POA: Diagnosis not present

## 2018-01-19 DIAGNOSIS — M25551 Pain in right hip: Secondary | ICD-10-CM | POA: Diagnosis not present

## 2018-01-19 DIAGNOSIS — M25552 Pain in left hip: Secondary | ICD-10-CM | POA: Diagnosis not present

## 2018-01-20 ENCOUNTER — Inpatient Hospital Stay: Payer: Medicare Other

## 2018-01-20 VITALS — BP 107/66 | HR 69 | Temp 96.5°F | Resp 18

## 2018-01-20 DIAGNOSIS — C9001 Multiple myeloma in remission: Secondary | ICD-10-CM

## 2018-01-20 LAB — MAGNESIUM: Magnesium: 1.4 mg/dL — ABNORMAL LOW (ref 1.7–2.4)

## 2018-01-20 MED ORDER — SODIUM CHLORIDE 0.9 % IV SOLN
Freq: Once | INTRAVENOUS | Status: AC
  Administered 2018-01-20: 09:00:00 via INTRAVENOUS
  Filled 2018-01-20: qty 1000

## 2018-01-20 MED ORDER — MAGNESIUM SULFATE 4 GM/100ML IV SOLN
4.0000 g | Freq: Once | INTRAVENOUS | Status: AC
  Administered 2018-01-20: 4 g via INTRAVENOUS
  Filled 2018-01-20: qty 100

## 2018-01-20 NOTE — Patient Instructions (Signed)
Hypomagnesemia  Hypomagnesemia is a condition in which the level of magnesium in the blood is low. Magnesium is a mineral that is found in many foods. It is used in many different processes in the body. Hypomagnesemia can affect every organ in the body. It can cause life-threatening problems.  What are the causes?  Causes of hypomagnesemia include:   Not getting enough magnesium in your diet.   Malnutrition.   Problems with absorbing magnesium from the intestines.   Dehydration.   Alcohol abuse.   Vomiting.   Severe diarrhea.   Some medicines, including medicines that make you urinate more.   Certain diseases, such as kidney disease, diabetes, and overactive thyroid.    What are the signs or symptoms?   Involuntary shaking or trembling of a body part (tremor).   Confusion.   Muscle weakness.   Sensitivity to light, sound, and touch.   Psychiatric issues, such as depression, irritability, or psychosis.   Sudden tightening of muscles (muscle spasms).   Tingling in the arms and legs.   A feeling of fluttering of the heart.  These symptoms are more severe if magnesium levels drop suddenly.  How is this diagnosed?  To make a diagnosis, your health care provider will do a physical exam and order blood and urine tests.  How is this treated?  Treatment will depend on the cause and the severity of your condition. It may involve:   A magnesium supplement. This can be taken in pill form. It can also be given through an IV tube. This is usually done if the condition is severe.   Changes to your diet. You may be directed to eat foods that have a lot of magnesium, such as green leafy vegetables, peas, beans, and nuts.   Eliminating alcohol from your diet.    Follow these instructions at home:   Include foods with magnesium in your diet. Foods that are rich in magnesium include green vegetables, beans, nuts and seeds, and whole grains.   Take medicines only as directed by your health care provider.   Take  magnesium supplements if your health care provider instructs you to do that. Take them as directed.   Have your magnesium levels monitored as directed by your health care provider.   When you are active, drink fluids that contain electrolytes.   Keep all follow-up visits as directed by your health care provider. This is important.  Contact a health care provider if:   You get worse instead of better.   Your symptoms return.  Get help right away if:   Your symptoms are severe.  This information is not intended to replace advice given to you by your health care provider. Make sure you discuss any questions you have with your health care provider.  Document Released: 03/13/2005 Document Revised: 11/23/2015 Document Reviewed: 01/31/2014  Elsevier Interactive Patient Education  2018 [employer]vier [employer]

## 2018-01-23 DIAGNOSIS — M545 Low back pain: Secondary | ICD-10-CM | POA: Diagnosis not present

## 2018-01-23 DIAGNOSIS — M25551 Pain in right hip: Secondary | ICD-10-CM | POA: Diagnosis not present

## 2018-01-23 DIAGNOSIS — M25552 Pain in left hip: Secondary | ICD-10-CM | POA: Diagnosis not present

## 2018-01-27 ENCOUNTER — Inpatient Hospital Stay: Payer: Medicare Other

## 2018-01-27 VITALS — BP 109/68 | HR 70 | Temp 96.3°F | Resp 18

## 2018-01-27 DIAGNOSIS — C9001 Multiple myeloma in remission: Secondary | ICD-10-CM

## 2018-01-27 LAB — MAGNESIUM: Magnesium: 1.5 mg/dL — ABNORMAL LOW (ref 1.7–2.4)

## 2018-01-27 MED ORDER — MAGNESIUM SULFATE 4 GM/100ML IV SOLN
4.0000 g | Freq: Once | INTRAVENOUS | Status: AC
  Administered 2018-01-27: 4 g via INTRAVENOUS
  Filled 2018-01-27: qty 100

## 2018-01-27 MED ORDER — SODIUM CHLORIDE 0.9 % IV SOLN
Freq: Once | INTRAVENOUS | Status: AC
  Administered 2018-01-27: 09:00:00 via INTRAVENOUS
  Filled 2018-01-27: qty 1000

## 2018-01-30 DIAGNOSIS — M545 Low back pain: Secondary | ICD-10-CM | POA: Diagnosis not present

## 2018-01-30 DIAGNOSIS — M25551 Pain in right hip: Secondary | ICD-10-CM | POA: Diagnosis not present

## 2018-01-30 DIAGNOSIS — M25552 Pain in left hip: Secondary | ICD-10-CM | POA: Diagnosis not present

## 2018-02-03 ENCOUNTER — Inpatient Hospital Stay: Payer: Medicare Other

## 2018-02-03 ENCOUNTER — Inpatient Hospital Stay: Payer: Medicare Other | Attending: Internal Medicine

## 2018-02-03 DIAGNOSIS — C9001 Multiple myeloma in remission: Secondary | ICD-10-CM

## 2018-02-03 DIAGNOSIS — R5383 Other fatigue: Secondary | ICD-10-CM | POA: Diagnosis not present

## 2018-02-03 DIAGNOSIS — Z9481 Bone marrow transplant status: Secondary | ICD-10-CM | POA: Diagnosis not present

## 2018-02-03 DIAGNOSIS — M549 Dorsalgia, unspecified: Secondary | ICD-10-CM | POA: Diagnosis not present

## 2018-02-03 DIAGNOSIS — Z7982 Long term (current) use of aspirin: Secondary | ICD-10-CM | POA: Insufficient documentation

## 2018-02-03 DIAGNOSIS — N183 Chronic kidney disease, stage 3 (moderate): Secondary | ICD-10-CM | POA: Diagnosis not present

## 2018-02-03 DIAGNOSIS — M47817 Spondylosis without myelopathy or radiculopathy, lumbosacral region: Secondary | ICD-10-CM | POA: Diagnosis not present

## 2018-02-03 DIAGNOSIS — Z833 Family history of diabetes mellitus: Secondary | ICD-10-CM | POA: Diagnosis not present

## 2018-02-03 DIAGNOSIS — K5909 Other constipation: Secondary | ICD-10-CM | POA: Diagnosis not present

## 2018-02-03 DIAGNOSIS — M7062 Trochanteric bursitis, left hip: Secondary | ICD-10-CM | POA: Diagnosis not present

## 2018-02-03 DIAGNOSIS — Z79899 Other long term (current) drug therapy: Secondary | ICD-10-CM | POA: Diagnosis not present

## 2018-02-03 DIAGNOSIS — Z9484 Stem cells transplant status: Secondary | ICD-10-CM | POA: Diagnosis not present

## 2018-02-03 DIAGNOSIS — E1122 Type 2 diabetes mellitus with diabetic chronic kidney disease: Secondary | ICD-10-CM | POA: Insufficient documentation

## 2018-02-03 DIAGNOSIS — R197 Diarrhea, unspecified: Secondary | ICD-10-CM | POA: Diagnosis not present

## 2018-02-03 DIAGNOSIS — Z6830 Body mass index (BMI) 30.0-30.9, adult: Secondary | ICD-10-CM | POA: Diagnosis not present

## 2018-02-03 DIAGNOSIS — Z7984 Long term (current) use of oral hypoglycemic drugs: Secondary | ICD-10-CM | POA: Insufficient documentation

## 2018-02-03 DIAGNOSIS — Z79891 Long term (current) use of opiate analgesic: Secondary | ICD-10-CM | POA: Diagnosis not present

## 2018-02-03 DIAGNOSIS — M533 Sacrococcygeal disorders, not elsewhere classified: Secondary | ICD-10-CM | POA: Diagnosis not present

## 2018-02-03 DIAGNOSIS — G8929 Other chronic pain: Secondary | ICD-10-CM | POA: Diagnosis not present

## 2018-02-03 DIAGNOSIS — M47816 Spondylosis without myelopathy or radiculopathy, lumbar region: Secondary | ICD-10-CM | POA: Diagnosis not present

## 2018-02-03 LAB — MAGNESIUM: Magnesium: 1.3 mg/dL — ABNORMAL LOW (ref 1.7–2.4)

## 2018-02-03 MED ORDER — SODIUM CHLORIDE 0.9 % IV SOLN
Freq: Once | INTRAVENOUS | Status: AC
  Administered 2018-02-03: 10:00:00 via INTRAVENOUS
  Filled 2018-02-03: qty 1000

## 2018-02-03 MED ORDER — MAGNESIUM SULFATE 4 GM/100ML IV SOLN
4.0000 g | Freq: Once | INTRAVENOUS | Status: AC
  Administered 2018-02-03: 4 g via INTRAVENOUS
  Filled 2018-02-03: qty 100

## 2018-02-03 NOTE — Patient Instructions (Signed)
Magnesium Sulfate injection What is this medicine? MAGNESIUM SULFATE (mag NEE zee um SUL fate) is an electrolyte injection commonly used to treat low magnesium levels in your blood. It is also used to prevent or control seizures in women with preeclampsia or eclampsia. This medicine may be used for other purposes; ask your health care provider or pharmacist if you have questions. What should I tell my health care provider before I take this medicine? They need to know if you have any of these conditions: -heart disease -history of irregular heart beat -kidney disease -an unusual or allergic reaction to magnesium sulfate, medicines, foods, dyes, or preservatives -pregnant or trying to get pregnant -breast-feeding How should I use this medicine? This medicine is for infusion into a vein. It is given by a health care professional in a hospital or clinic setting. Talk to your pediatrician regarding the use of this medicine in children. While this drug may be prescribed for selected conditions, precautions do apply. Overdosage: If you think you have taken too much of this medicine contact a poison control center or emergency room at once. NOTE: This medicine is only for you. Do not share this medicine with others. What if I miss a dose? This does not apply. What may interact with this medicine? This medicine may interact with the following medications: -certain medicines for anxiety or sleep -certain medicines for seizures like phenobarbital -digoxin -medicines that relax muscles for surgery -narcotic medicines for pain This list may not describe all possible interactions. Give your health care provider a list of all the medicines, herbs, non-prescription drugs, or dietary supplements you use. Also tell them if you smoke, drink alcohol, or use illegal drugs. Some items may interact with your medicine. What should I watch for while using this medicine? Your condition will be monitored carefully  while you are receiving this medicine. You may need blood work done while you are receiving this medicine. What side effects may I notice from receiving this medicine? Side effects that you should report to your doctor or health care professional as soon as possible: -allergic reactions like skin rash, itching or hives, swelling of the face, lips, or tongue -facial flushing -muscle weakness -signs and symptoms of low blood pressure like dizziness; feeling faint or lightheaded, falls; unusually weak or tired -signs and symptoms of a dangerous change in heartbeat or heart rhythm like chest pain; dizziness; fast or irregular heartbeat; palpitations; breathing problems -sweating This list may not describe all possible side effects. Call your doctor for medical advice about side effects. You may report side effects to FDA at 1-800-FDA-1088. Where should I keep my medicine? This drug is given in a hospital or clinic and will not be stored at home. NOTE: This sheet is a summary. It may not cover all possible information. If you have questions about this medicine, talk to your doctor, pharmacist, or health care provider.  2018 Elsevier/Gold Standard (2016-01-03 12:31:42)  

## 2018-02-09 ENCOUNTER — Ambulatory Visit (INDEPENDENT_AMBULATORY_CARE_PROVIDER_SITE_OTHER): Payer: Medicare Other | Admitting: Internal Medicine

## 2018-02-09 ENCOUNTER — Encounter: Payer: Self-pay | Admitting: Internal Medicine

## 2018-02-09 VITALS — BP 132/70 | HR 72 | Resp 16 | Ht 62.0 in | Wt 169.0 lb

## 2018-02-09 DIAGNOSIS — K5901 Slow transit constipation: Secondary | ICD-10-CM

## 2018-02-09 DIAGNOSIS — I10 Essential (primary) hypertension: Secondary | ICD-10-CM | POA: Diagnosis not present

## 2018-02-09 DIAGNOSIS — I639 Cerebral infarction, unspecified: Secondary | ICD-10-CM | POA: Diagnosis not present

## 2018-02-09 DIAGNOSIS — M87180 Osteonecrosis due to drugs, jaw: Secondary | ICD-10-CM

## 2018-02-09 DIAGNOSIS — D696 Thrombocytopenia, unspecified: Secondary | ICD-10-CM | POA: Diagnosis not present

## 2018-02-09 DIAGNOSIS — K589 Irritable bowel syndrome without diarrhea: Secondary | ICD-10-CM

## 2018-02-09 DIAGNOSIS — I495 Sick sinus syndrome: Secondary | ICD-10-CM

## 2018-02-09 DIAGNOSIS — E1122 Type 2 diabetes mellitus with diabetic chronic kidney disease: Secondary | ICD-10-CM

## 2018-02-09 DIAGNOSIS — T458X5A Adverse effect of other primarily systemic and hematological agents, initial encounter: Secondary | ICD-10-CM

## 2018-02-09 DIAGNOSIS — N182 Chronic kidney disease, stage 2 (mild): Secondary | ICD-10-CM | POA: Diagnosis not present

## 2018-02-09 NOTE — Progress Notes (Signed)
Date:  02/09/2018   Name:  Sarah [name]   DOB:  [birth year]   MRN:  [id number]   Chief Complaint: Diabetes (4 month f/up.) and Hyperlipidemia (Wants to discuss changing meds. Pravastatin is causing constipation and then diarrhea. "I have no medium") Diabetes  She presents for her follow-up diabetic visit. She has type 2 diabetes mellitus. Her disease course has been stable. Pertinent negatives for hypoglycemia include no dizziness or headaches. Pertinent negatives for diabetes include no chest pain. Symptoms are stable. Current diabetic treatment includes diet. She is compliant with treatment most of the time. Her weight is stable. She is following a generally healthy diet. She monitors blood glucose at home 1-2 x per week. An ACE inhibitor/angiotensin II receptor blocker is being taken. Eye exam is not current.  Hypertension  This is a chronic problem. The problem is controlled. Pertinent negatives include no chest pain, headaches, palpitations or shortness of breath. Risk factors for coronary artery disease include diabetes mellitus and dyslipidemia (seen by cardiology ). Past treatments include beta blockers and ACE inhibitors. The current treatment provides significant improvement. Hypertensive end-organ damage includes kidney disease. and SA node dysfunction; followed by cardiology.  Constipation  This is a chronic problem. The current episode started more than 1 year ago. The problem is unchanged. The stool is described as firm. The patient is not on a high fiber diet. She does not exercise regularly. There has been adequate water intake. Associated symptoms include back pain. Pertinent negatives include no fever, nausea or vomiting. She has tried fiber (daily use too strong) for the symptoms.  Stage 3 CKD - seeing nephrology now. Renal US is normal. Lisinopril added. Thrombocytopenia - followed by Oncology.    Lab Results  Component Value Date   HGBA1C 6.4 (H) 10/10/2017   Lab  Results  Component Value Date   CREATININE 1.17 (H) 12/16/2017   BUN 35 (H) 12/16/2017   NA 139 12/16/2017   K 4.4 12/16/2017   CL 104 12/16/2017   CO2 22 12/16/2017   Lab Results  Component Value Date   CHOL 176 07/25/2017   HDL 81 07/25/2017   LDLCALC 74 07/25/2017   TRIG 104 07/25/2017   CHOLHDL 2.2 07/25/2017     Review of Systems  Constitutional: Positive for unexpected weight change. Negative for chills and fever.  Respiratory: Negative for cough, chest tightness and shortness of breath.   Cardiovascular: Negative for chest pain and palpitations.  Gastrointestinal: Positive for constipation. Negative for nausea and vomiting.  Genitourinary: Negative for dysuria and urgency.  Musculoskeletal: Positive for back pain.  Skin: Negative for rash and wound.  Neurological: Negative for dizziness and headaches.  Psychiatric/Behavioral: Negative for dysphoric mood.    Patient Active Problem List   Diagnosis Date Noted  . Thrombocytopenia ([place]) 02/09/2018  . Benign essential HTN 08/21/2017  . Bilateral carotid artery stenosis 08/21/2017  . Cardiomyopathy, idiopathic ([place]) 08/21/2017  . Carotid artery stenosis, asymptomatic, bilateral 08/06/2017  . Thyroid nodule 08/06/2017  . Cardiac syncope 07/25/2017  . Iron deficiency anemia due to chronic blood loss 05/13/2017  . Spinal stenosis of lumbar region with neurogenic claudication 04/09/2017  . Magnesium deficiency 04/08/2017  . 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 Dizziness 10/10/2016  . LBBB (left bundle branch block) 10/10/2016  . Long term prescription opiate use 09/07/2016  . Long term current use of opiate analgesic 07/09/2016  . Chronic pain syndrome 07/08/2016  . Pain medication agreement signed 07/08/2016  . Spondylosis  of lumbar region without myelopathy or radiculopathy 07/08/2016  . Lumbar spondylosis 06/06/2016  . Chronic diastolic CHF (congestive heart failure), NYHA class 2  ([place]) 06/05/2016  . Hx of cardiac catheterization 06/05/2016  . LVH (left ventricular hypertrophy) due to hypertensive disease, with heart failure ([place]) 06/05/2016  . Mild aortic valve stenosis 06/05/2016  . 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[place]) 06/05/2016  . Sinus congestion 05/30/2016  . Environmental and seasonal allergies 04/19/2016  . Insomnia 04/19/2016  . Bicuspid aortic valve 04/19/2016  . Asthma 04/19/2016  . Aortic regurgitation 04/19/2016  . History of CHF (congestive heart failure) 04/19/2016  . DM (diabetes mellitus), type 2 with renal complications ([place]) 74/16/3845  . Anemia 04/12/2016  . GERD (gastroesophageal reflux disease) 04/12/2016  . Depression with anxiety 04/12/2016  . Irritable bowel syndrome (IBS) 04/12/2016  . Hepatic steatosis 04/12/2016  . Multiple myeloma in remission ([place]) 04/02/2016  . Abnormal stress test 02/14/2016  . 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[place]) 07/06/2015  . Colitis 02/14/2013  . Disequilibrium 01/14/2013    Allergies  Allergen Reactions  . Oxycodone-Acetaminophen Anaphylaxis    Swelling and rash  . Codeine   . Benadryl [Diphenhydramine] Palpitations  . Morphine Itching and Rash  . Ondansetron Diarrhea  . Tylenol [Acetaminophen] Itching and Rash    Past Surgical History:  Procedure Laterality Date  . ABDOMINAL HYSTERECTOMY    . 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 CARDIAC ELECTROPHYSIOLOGY MAPPING AND ABLATION    . CARPAL TUNNEL RELEASE Bilateral   . CHOLECYSTECTOMY  2008  . COLONOSCOPY WITH PROPOFOL N/A 05/07/2017   Procedure: COLONOSCOPY WITH PROPOFOL;  Surgeon: [name], MD;  Location: [facility] ENDOSCOPY;  Service: Gastroenterology;  Laterality: N/A;  . ESOPHAGOGASTRODUODENOSCOPY (EGD) WITH PROPOFOL N/A 05/07/2017   Procedure: ESOPHAGOGASTRODUODENOSCOPY (EGD) WITH PROPOFOL;  Surgeon: [name], MD;  Location: [facility] ENDOSCOPY;  Service: Gastroenterology;  Laterality: N/A;  . FOOT SURGERY Bilateral   . INCONTINENCE SURGERY   2009  . PARTIAL HYSTERECTOMY  03/1996   fibroids  . TONSILLECTOMY  2007    Social History   Tobacco Use  . Smoking status: Former Smoker    Packs/day: 1.00    Years: 20.00    Pack years: 20.00    Types: Cigarettes    Last attempt to quit: 07/02/1991    Years since quitting: 26.6  . Smokeless tobacco: Never Used  Substance Use Topics  . Alcohol use: Yes    Alcohol/week: 2.0 standard drinks    Types: 1 Glasses of wine, 1 Cans of beer per week    Comment: occassional  . Drug use: No     Medication list has been reviewed and updated.  Current Meds  Medication Sig  . allopurinol (ZYLOPRIM) 100 MG tablet TAKE 1 TABLET(100 MG) BY MOUTH DAILY as needed  . aspirin 81 MG chewable tablet Chew 1 tablet (81 mg total) by mouth daily.  . bisoprolol (ZEBETA) 10 MG tablet TAKE 1 TABLET(10 MG) BY MOUTH DAILY  . diphenoxylate-atropine (LOMOTIL) 2.5-0.025 MG tablet Take 2 tablets by mouth daily as needed for diarrhea or loose stools.  . DULoxetine (CYMBALTA) 30 MG capsule Take 3 capsules (90 mg total) by mouth daily.  . fentaNYL (DURAGESIC - DOSED MCG/HR) 25 MCG/HR patch Place 25 mcg onto the skin every 3 (three) days.  . fexofenadine (ALLEGRA) 180 MG tablet TAKE 1 TABLET(180 MG) BY MOUTH DAILY  . fluticasone (FLONASE) 50 MCG/ACT nasal spray Place 2 sprays into both nostrils daily.  Marland Kitchen glucose blood (ONE TOUCH  ULTRA TEST) test strip   . hydrOXYzine (ATARAX/VISTARIL) 10 MG tablet TAKE 1 TABLET(10 MG) BY MOUTH THREE TIMES DAILY AS NEEDED  . lisinopril (PRINIVIL,ZESTRIL) 10 MG tablet Take 10 mg by mouth daily.  . Menaquinone-7 (VITAMIN K2) 100 MCG CAPS Take by mouth.  . Multiple Vitamins-Minerals (HAIR/SKIN/NAILS/BIOTIN PO) Take 1 capsule by mouth 3 (three) times daily.  . NON FORMULARY   . Omega 3-6-9 Fatty Acids (OMEGA 3-6-9 COMPLEX) CAPS Take by mouth.  Marland Kitchen omeprazole (PRILOSEC) 40 MG capsule Take 1 capsule (40 mg total) by mouth daily.  . pravastatin (PRAVACHOL) 20 MG tablet Take 1 tablet (20  mg total) by mouth daily.  . pregabalin (LYRICA) 50 MG capsule Take 100 mg by mouth daily.  . promethazine (PHENERGAN) 25 MG tablet Take 1-2 tablets by mouth twice daily before meals as needed  . tiZANidine (ZANAFLEX) 4 MG tablet TAKE 1 TABLET BY MOUTH EVERY 8 HOURS AS NEEDED, MUSCLE RELAXER FOR BACK PAIN  . Valerian Root 500 MG CAPS Take 1 capsule by mouth daily.   . zaleplon (SONATA) 10 MG capsule TAKE ONE CAPSULE BY MOUTH EVERY DAY AT BEDTIME AS NEEDED FOR SLEEP    PHQ 2/9 Scores 02/09/2018 08/06/2017 08/06/2017 04/09/2017  PHQ - 2 Score 2 0 0 1  PHQ- 9 Score 10 6 0 5    Physical Exam  Constitutional: She is oriented to person, place, and time. She appears well-developed. No distress.  HENT:  Head: Normocephalic and atraumatic.  Cardiovascular: Normal rate, regular rhythm and normal heart sounds.  Pulmonary/Chest: Effort normal and breath sounds normal. No respiratory distress.  Musculoskeletal: Normal range of motion.  Neurological: She is alert and oriented to person, place, and time.  Skin: Skin is warm and dry. No rash noted.  Psychiatric: She has a normal mood and affect. Her behavior is normal. Thought content normal.  Nursing note and vitals reviewed.   BP 132/70 (BP Location: Right Arm, Patient Position: Sitting, Cuff Size: Normal)   Pulse 72   Resp 16   Ht 5' 2"  (1.575 m)   Wt 169 lb (76.7 kg)   SpO2 98%   BMI 30.91 kg/m   Assessment and Plan: 1. Type 2 diabetes mellitus with stage 2 chronic kidney disease, without long-term current use of insulin (HCC) Continue diet, will add medication if needed - probably DDP4 - Hemoglobin A1c - Ambulatory referral to Ophthalmology - Lipid panel  2. Benign essential HTN controlled  3. 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[place]) Followed by cardiology, stable  4. Thrombocytopenia (HCC) Stable, monitored by Oncology  5. Slow transit constipation Recommend Miralax - adjust daily dose to achieve stool every day to every other day  6.  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   Partially dictated using Editor, commissioning. Any errors are unintentional.  Halina Maidens, MD [facility]  02/09/2018

## 2018-02-10 ENCOUNTER — Inpatient Hospital Stay: Payer: Medicare Other

## 2018-02-10 LAB — HEMOGLOBIN A1C
ESTIMATED AVERAGE GLUCOSE: 163 mg/dL
Hgb A1c MFr Bld: 7.3 % — ABNORMAL HIGH (ref 4.8–5.6)

## 2018-02-10 LAB — LIPID PANEL
CHOLESTEROL TOTAL: 160 mg/dL (ref 100–199)
Chol/HDL Ratio: 2.2 ratio (ref 0.0–4.4)
HDL: 72 mg/dL (ref >39)
LDL Calculated: 70 mg/dL (ref 0–99)
Triglycerides: 91 mg/dL (ref 0–149)
VLDL Cholesterol Cal: 18 mg/dL (ref 5–40)

## 2018-02-11 ENCOUNTER — Other Ambulatory Visit: Payer: Self-pay | Admitting: Internal Medicine

## 2018-02-11 ENCOUNTER — Telehealth: Payer: Self-pay

## 2018-02-11 DIAGNOSIS — N182 Chronic kidney disease, stage 2 (mild): Principal | ICD-10-CM

## 2018-02-11 DIAGNOSIS — E1122 Type 2 diabetes mellitus with diabetic chronic kidney disease: Secondary | ICD-10-CM

## 2018-02-11 MED ORDER — METFORMIN HCL ER 500 MG PO TB24: 500.0000 mg | ORAL_TABLET | Freq: Every day | ORAL | 4 refills | Status: DC

## 2018-02-11 NOTE — Telephone Encounter (Signed)
Patient called stating she would like to begin metforming (per lab results) Please sent to [facility] in [place].

## 2018-02-15 ENCOUNTER — Other Ambulatory Visit: Payer: Self-pay | Admitting: Internal Medicine

## 2018-02-15 DIAGNOSIS — I6523 Occlusion and stenosis of bilateral carotid arteries: Secondary | ICD-10-CM

## 2018-02-15 DIAGNOSIS — F418 Other specified anxiety disorders: Secondary | ICD-10-CM

## 2018-02-17 ENCOUNTER — Inpatient Hospital Stay (HOSPITAL_BASED_OUTPATIENT_CLINIC_OR_DEPARTMENT_OTHER): Payer: Medicare Other | Admitting: Internal Medicine

## 2018-02-17 ENCOUNTER — Inpatient Hospital Stay: Payer: Medicare Other

## 2018-02-17 ENCOUNTER — Other Ambulatory Visit: Payer: Self-pay | Admitting: *Deleted

## 2018-02-17 VITALS — BP 99/65 | HR 67 | Temp 97.1°F | Resp 16 | Wt 164.9 lb

## 2018-02-17 DIAGNOSIS — C9001 Multiple myeloma in remission: Secondary | ICD-10-CM | POA: Diagnosis not present

## 2018-02-17 DIAGNOSIS — G8929 Other chronic pain: Secondary | ICD-10-CM | POA: Diagnosis not present

## 2018-02-17 DIAGNOSIS — Z7982 Long term (current) use of aspirin: Secondary | ICD-10-CM | POA: Diagnosis not present

## 2018-02-17 DIAGNOSIS — R197 Diarrhea, unspecified: Secondary | ICD-10-CM

## 2018-02-17 DIAGNOSIS — Z7984 Long term (current) use of oral hypoglycemic drugs: Secondary | ICD-10-CM

## 2018-02-17 DIAGNOSIS — R5383 Other fatigue: Secondary | ICD-10-CM | POA: Diagnosis not present

## 2018-02-17 DIAGNOSIS — K5909 Other constipation: Secondary | ICD-10-CM | POA: Diagnosis not present

## 2018-02-17 DIAGNOSIS — E1122 Type 2 diabetes mellitus with diabetic chronic kidney disease: Secondary | ICD-10-CM | POA: Diagnosis not present

## 2018-02-17 DIAGNOSIS — Z79899 Other long term (current) drug therapy: Secondary | ICD-10-CM

## 2018-02-17 DIAGNOSIS — Z9484 Stem cells transplant status: Secondary | ICD-10-CM

## 2018-02-17 DIAGNOSIS — Z9481 Bone marrow transplant status: Secondary | ICD-10-CM

## 2018-02-17 DIAGNOSIS — M549 Dorsalgia, unspecified: Secondary | ICD-10-CM

## 2018-02-17 DIAGNOSIS — Z833 Family history of diabetes mellitus: Secondary | ICD-10-CM

## 2018-02-17 DIAGNOSIS — N183 Chronic kidney disease, stage 3 (moderate): Secondary | ICD-10-CM

## 2018-02-17 LAB — CBC WITH DIFFERENTIAL/PLATELET
BASOS ABS: 0 10*3/uL (ref 0–0.1)
Basophils Relative: 0 %
EOS ABS: 0.1 10*3/uL (ref 0–0.7)
EOS PCT: 2 %
HCT: 36.6 % (ref 35.0–47.0)
Hemoglobin: 12.3 g/dL (ref 12.0–16.0)
LYMPHS PCT: 22 %
Lymphs Abs: 1.2 10*3/uL (ref 1.0–3.6)
MCH: 30.6 pg (ref 26.0–34.0)
MCHC: 33.7 g/dL (ref 32.0–36.0)
MCV: 90.8 fL (ref 80.0–100.0)
MONO ABS: 0.5 10*3/uL (ref 0.2–0.9)
Monocytes Relative: 9 %
Neutro Abs: 3.6 10*3/uL (ref 1.4–6.5)
Neutrophils Relative %: 67 %
PLATELETS: 132 10*3/uL — AB (ref 150–440)
RBC: 4.03 MIL/uL (ref 3.80–5.20)
RDW: 13.4 % (ref 11.5–14.5)
WBC: 5.3 10*3/uL (ref 3.6–11.0)

## 2018-02-17 LAB — COMPREHENSIVE METABOLIC PANEL
ALBUMIN: 4.3 g/dL (ref 3.5–5.0)
ALK PHOS: 97 U/L (ref 38–126)
ALT: 92 U/L — AB (ref 0–44)
AST: 63 U/L — AB (ref 15–41)
Anion gap: 12 (ref 5–15)
BILIRUBIN TOTAL: 0.4 mg/dL (ref 0.3–1.2)
BUN: 40 mg/dL — AB (ref 8–23)
CALCIUM: 9.1 mg/dL (ref 8.9–10.3)
CO2: 20 mmol/L — ABNORMAL LOW (ref 22–32)
CREATININE: 1.48 mg/dL — AB (ref 0.44–1.00)
Chloride: 107 mmol/L (ref 98–111)
GFR calc Af Amer: 43 mL/min — ABNORMAL LOW (ref >60)
GFR calc non Af Amer: 37 mL/min — ABNORMAL LOW (ref >60)
Glucose, Bld: 163 mg/dL — ABNORMAL HIGH (ref 70–99)
POTASSIUM: 4.7 mmol/L (ref 3.5–5.1)
Sodium: 139 mmol/L (ref 135–145)
TOTAL PROTEIN: 7.5 g/dL (ref 6.5–8.1)

## 2018-02-17 LAB — MAGNESIUM: Magnesium: 1.3 mg/dL — ABNORMAL LOW (ref 1.7–2.4)

## 2018-02-17 MED ORDER — MAGNESIUM SULFATE 4 GM/100ML IV SOLN
4.0000 g | Freq: Once | INTRAVENOUS | Status: AC
  Administered 2018-02-17: 4 g via INTRAVENOUS
  Filled 2018-02-17: qty 100

## 2018-02-17 MED ORDER — SODIUM CHLORIDE 0.9 % IV SOLN
Freq: Once | INTRAVENOUS | Status: AC
  Administered 2018-02-17: 10:00:00 via INTRAVENOUS
  Filled 2018-02-17: qty 1000

## 2018-02-17 NOTE — Progress Notes (Signed)
[facility] OFFICE PROGRESS NOTE  Patient Care Team: Glean Hess, MD as PCP - General (Internal Medicine) Cammie Sickle, MD as Consulting Physician (Oncology) Jodell Cipro, MD as Referring Physician (Pain Medicine) Corey Skains, MD as Consulting Physician (Cardiology)  Cancer Staging No matching staging information was found for the patient.   Oncology History   # June 2017- [facility] MULTIPLE MYELOMA [BMBx- 80% plasma cells; [facility]]; Lamda light chain 1340; s/p RVD   # 14th DEC 2016- Auto-Stem cell transplant [[facility];Dr.Hertzig]; rev [dose reduced sec to [place]  # Maintenance Revlimid- 43m 3 w-ON & 1 week OFF; HELD July 2018- sec to ONJ  # Bone lesions-numerous ~564mlytic lesions in Thoracic spine- on Zometa  # 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  # "Colitis" s/p multiple EGD/Colonoscopies; EGD- patchy inflammation/colo- Nov 2018 [Dr.Anna]  DIAGNOSIS: Multiple myeloma  GOALS: Control/palliative  CURRENT/MOST RECENT THERAPY -surveillance      [place] ([facility]     INTERVAL HISTORY:  [name]  female pleasant patient above history of multiple myeloma currently on surveillance; off maintenance Revlimid because of ONJ is here for follow-up.  Patient continues to complain of chronic mild fatigue.  She also has chronic constipation followed by diarrhea.  However had episode of diarrhea last week.  Currently resolved.  Review of Systems  Constitutional: Positive for malaise/fatigue. Negative for chills, diaphoresis, fever and weight loss.  HENT: Negative for nosebleeds and sore throat.   Eyes: Negative for double vision.  Respiratory: Negative for cough, hemoptysis, sputum production, shortness of breath  and wheezing.   Cardiovascular: Negative for chest pain, palpitations, orthopnea and leg swelling.  Gastrointestinal: Positive for constipation and diarrhea. Negative for abdominal pain, blood in stool, heartburn, melena, nausea and vomiting.  Genitourinary: Negative for dysuria, frequency and urgency.  Musculoskeletal: Positive for back pain and joint pain.  Skin: Negative.  Negative for itching and rash.  Neurological: Negative for dizziness, tingling, focal weakness, weakness and headaches.  Endo/Heme/Allergies: Does not bruise/bleed easily.  Psychiatric/Behavioral: Negative for depression. The patient is not nervous/anxious and does not have insomnia.       PAST MEDICAL HISTORY :  Past Medical History:  Diagnosis Date  . Anemia   . Anxiety   . Arthritis   . Bicuspid aortic valve   . CH[facility] [place]congestive heart failure) ([facility] [place]  . CKD (chronic kidney disease) stage 3, GFR 30-59 ml/min (HCC)   . Depression   . Diabetes mellitus ([facility]  . Dizziness   . Fatty liver   . Frequent falls   . GERD (gastroesophageal reflux disease)   . Gout   . Heart murmur   . History of blood transfusion   . [place] ([facility]  . History of uterine fibroid   . Hypertension   . Hypomagnesemia   . Multiple myeloma ([facility]  . Personal history of chemotherapy   . Renal cyst     PAST SURGICAL HISTORY :   Past Surgical History:  Procedure Laterality Date  . ABDOMINAL HYSTERECTOMY    . 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 CARDIAC ELECTROPHYSIOLOGY MAPPING AND ABLATION    . CARPAL TUNNEL RELEASE Bilateral   . CHOLECYSTECTOMY  2008  . COLONOSCOPY WITH  PROPOFOL N/A 05/07/2017   Procedure: COLONOSCOPY WITH PROPOFOL;  Surgeon: [name], MD;  Location[facility] ENDOSCOPY;  Service: Gastroenterology;  Laterality: N/A;  . ESOPHAGOGASTRODUODENOSCOPY (EGD) WITH PROPOFOL N/A 05/07/2017   Procedure: ESOPHAGOGASTRODUODENOSCOPY (EGD) WITH PROPOFOL;  Surgeon: [name], MD;  Location: [facility]  ENDOSCOPY;  Service: Gastroenterology;  Laterality: N/A;  . FOOT SURGERY Bilateral   . INCONTINENCE SURGERY  2009  . PARTIAL HYSTERECTOMY  03/1996   fibroids  . TONSILLECTOMY  2007    FAMILY HISTORY :   Family History  Problem Relation Age of Onset  . Colon cancer Father   . Renal Disease Father   . Diabetes Mellitus II Father   . Melanoma Paternal Grandmother   . Breast cancer Maternal Aunt 71  . Anemia Mother   . Heart disease Mother   . Heart failure Mother   . Renal Disease Mother   . Congestive Heart Failure Mother   . Heart disease Maternal Uncle   . Throat cancer Maternal Uncle   . Lung cancer Maternal Uncle   . Liver disease Maternal Uncle   . Heart failure Maternal Uncle   . Hearing loss Son 72       Suicide     SOCIAL HISTORY:   Social History   Tobacco Use  . Smoking status: Former Smoker    Packs/day: 1.00    Years: 20.00    Pack years: 20.00    Types: Cigarettes    Last attempt to quit: 07/02/1991    Years since quitting: 26.6  . Smokeless tobacco: Never Used  Substance Use Topics  . Alcohol use: Yes    Alcohol/week: 2.0 standard drinks    Types: 1 Glasses of wine, 1 Cans of beer per week    Comment: occassional  . Drug use: No    ALLERGIES:  is allergic to oxycodone-acetaminophen; codeine; benadryl [diphenhydramine]; morphine; ondansetron; and tylenol [acetaminophen].  MEDICATIONS:  Current Outpatient Medications  Medication Sig Dispense Refill  . allopurinol (ZYLOPRIM) 100 MG tablet TAKE 1 TABLET(100 MG) BY MOUTH DAILY as needed 30 tablet 5  . aspirin 81 MG chewable tablet Chew 1 tablet (81 mg total) by mouth daily. 30 tablet 0  . bisoprolol (ZEBETA) 10 MG tablet TAKE 1 TABLET(10 MG) BY MOUTH DAILY 30 tablet 5  . diphenoxylate-atropine (LOMOTIL) 2.5-0.025 MG tablet Take 2 tablets by mouth daily as needed for diarrhea or loose stools. 30 tablet 2  . DULoxetine (CYMBALTA) 30 MG capsule TAKE 3 CAPSULES(90 MG) BY MOUTH DAILY 90 capsule 3  .  fentaNYL (DURAGESIC - DOSED MCG/HR) 25 MCG/HR patch Place 25 mcg onto the skin every 3 (three) days.    . fexofenadine (ALLEGRA) 180 MG tablet TAKE 1 TABLET(180 MG) BY MOUTH DAILY 30 tablet 5  . fluticasone (FLONASE) 50 MCG/ACT nasal spray Place 2 sprays into both nostrils daily. 16 g 0  . glucose blood (ONE TOUCH ULTRA TEST) test strip     . hydrOXYzine (ATARAX/VISTARIL) 10 MG tablet TAKE 1 TABLET(10 MG) BY MOUTH THREE TIMES DAILY AS NEEDED 90 tablet 0  . lisinopril (PRINIVIL,ZESTRIL) 10 MG tablet Take 10 mg by mouth daily.    Marland Kitchen MAGNESIUM CL-CALCIUM CARBONATE PO Take 1 tablet by mouth daily.    . Menaquinone-7 (VITAMIN K2) 100 MCG CAPS Take by mouth.    . metFORMIN (GLUCOPHAGE-XR) 500 MG 24 hr tablet Take 1 tablet (500 mg total) by mouth daily with breakfast. 30 tablet 4  . Misc Natural Products (OSTEO BI-FLEX ADV TRIPLE ST PO)  Take 2 tablets by mouth daily.    . Multiple Vitamins-Minerals (HAIR/SKIN/NAILS/BIOTIN PO) Take 1 capsule by mouth 3 (three) times daily.    . NON FORMULARY     . Omega 3-6-9 Fatty Acids (OMEGA 3-6-9 COMPLEX) CAPS Take by mouth.    Marland Kitchen omeprazole (PRILOSEC) 40 MG capsule Take 1 capsule (40 mg total) by mouth daily. 30 capsule 5  . pravastatin (PRAVACHOL) 20 MG tablet TAKE 1 TABLET(20 MG) BY MOUTH DAILY 30 tablet 12  . pregabalin (LYRICA) 50 MG capsule Take 100 mg by mouth daily.    . promethazine (PHENERGAN) 25 MG tablet Take 1-2 tablets by mouth twice daily before meals as needed 60 tablet 3  . tiZANidine (ZANAFLEX) 4 MG tablet TAKE 1 TABLET BY MOUTH EVERY 8 HOURS AS NEEDED, MUSCLE RELAXER FOR BACK PAIN 90 tablet 5  . Valerian Root 500 MG CAPS Take 1 capsule by mouth daily.     . zaleplon (SONATA) 10 MG capsule TAKE ONE CAPSULE BY MOUTH EVERY DAY AT BEDTIME AS NEEDED FOR SLEEP 30 capsule 5   No current facility-administered medications for this visit.    Facility-Administered Medications Ordered in Other Visits  Medication Dose Route Frequency Provider Last Rate Last  Dose  . magnesium sulfate IVPB 4 g 100 mL  4 g Intravenous Once Cammie Sickle, MD 50 mL/hr at 02/17/18 0934 4 g at 02/17/18 0934    PHYSICAL EXAMINATION: ECOG PERFORMANCE STATUS: 1 - Symptomatic but completely ambulatory  BP 99/65 (BP Location: Left Arm, Patient Position: Sitting)   Pulse 67   Temp (!) 97.1 F (36.2 C) (Tympanic)   Resp 16   Wt 164 lb 14.5 oz (74.8 kg)   BMI 30.16 kg/m   Filed Weights   02/17/18 0904  Weight: 164 lb 14.5 oz (74.8 kg)    Physical Exam  Constitutional: She is oriented to person, place, and time and well-developed, well-nourished, and in no distress.  HENT:  Head: Normocephalic and atraumatic.  Mouth/Throat: Oropharynx is clear and moist. No oropharyngeal exudate.  Eyes: Pupils are equal, round, and reactive to light.  Neck: Normal range of motion. Neck supple.  Cardiovascular: Normal rate and regular rhythm.  Pulmonary/Chest: No respiratory distress. She has no wheezes.  Abdominal: Soft. Bowel sounds are normal. She exhibits no distension and no mass. There is no tenderness. There is no rebound and no guarding.  Musculoskeletal: Normal range of motion. She exhibits no edema or tenderness.  Neurological: She is alert and oriented to person, place, and time.  Skin: Skin is warm.  Psychiatric: Affect normal.       LABORATORY DATA:  I have reviewed the data as listed    Component Value Date/Time   NA 139 02/17/2018 0857   NA 141 04/19/2016 1604   K 4.7 02/17/2018 0857   CL 107 02/17/2018 0857   CO2 20 (L) 02/17/2018 0857   GLUCOSE 163 (H) 02/17/2018 0857   BUN 40 (H) 02/17/2018 0857   BUN 22 04/19/2016 1604   CREATININE 1.48 (H) 02/17/2018 0857   CALCIUM 9.1 02/17/2018 0857   PROT 7.5 02/17/2018 0857   ALBUMIN 4.3 02/17/2018 0857   AST 63 (H) 02/17/2018 0857   ALT 92 (H) 02/17/2018 0857   ALKPHOS 97 02/17/2018 0857   BILITOT 0.4 02/17/2018 0857   GFRNONAA 37 (L) 02/17/2018 0857   GFRAA 43 (L) 02/17/2018 0857    No  results found for: SPEP, UPEP  Lab Results  Component Value Date   WBC  5.3 02/17/2018   NEUTROABS 3.6 02/17/2018   HGB 12.3 02/17/2018   HCT 36.6 02/17/2018   MCV 90.8 02/17/2018   PLT 132 (L) 02/17/2018      Chemistry      Component Value Date/Time   NA 139 02/17/2018 0857   NA 141 04/19/2016 1604   K 4.7 02/17/2018 0857   CL 107 02/17/2018 0857   CO2 20 (L) 02/17/2018 0857   BUN 40 (H) 02/17/2018 0857   BUN 22 04/19/2016 1604   CREATININE 1.48 (H) 02/17/2018 0857      Component Value Date/Time   CALCIUM 9.1 02/17/2018 0857   ALKPHOS 97 02/17/2018 0857   AST 63 (H) 02/17/2018 0857   ALT 92 (H) 02/17/2018 0857   BILITOT 0.4 02/17/2018 0857       RADIOGRAPHIC STUDIES: I have personally reviewed the radiological images as listed and agreed with the findings in the report. No results found.   ASSESSMENT & PLAN:  Multiple myeloma in remission ([place]) Multiple myeloma IgA lambda status post autologous stem cell transplant in December 2016 [[facility]].  Currently on surveillance. June 2019 M protein no evidence of myeloma.  Clinically stable.  Awaiting on myeloma labs from today.  #If recurrence noted on myeloma panel would recommend- daratumumab plus IMID next line of therapy  #ONJ August 2018-continue to hold bisphosphonate; STABLE.  # Nausea chronic on Phenergan 25 mg prn.  Stable.  #Chronic diarrhea/constipation alternating-currently stable.  # Hypomagnesia- [symptomatic- palipi/cramps- ] stable.  Cont mag 4 gm weekly.   #Chronic CKD stage III-stable.  #Chronic back pain-follows up with pain clinic.  Stable.  # follow up in 2 months/myeloma panel; k-l light chains; weekly mag/infusion;    No orders of the defined types were placed in this encounter.  All questions were answered. The patient knows to call the clinic with any problems, questions or concerns.      Cammie Sickle, MD 02/17/2018 10:11 AM

## 2018-02-17 NOTE — Assessment & Plan Note (Addendum)
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  Currently on surveillance. June 2019 M protein no evidence of myeloma.  Clinically stable.  Awaiting on myeloma labs from today.  #If recurrence noted on myeloma panel would recommend- daratumumab plus IMID next line of therapy  #ONJ August 2018-continue to hold bisphosphonate; STABLE.  # Nausea chronic on Phenergan 25 mg prn.  Stable.  #Chronic diarrhea/constipation alternating-currently stable.  # Hypomagnesia- [symptomatic- palipi/cramps- ] stable.  Cont mag 4 gm weekly.   #Chronic CKD stage III-stable.  #Chronic back pain-follows up with pain clinic.  Stable.  # follow up in 2 months/myeloma panel; k-l light chains; weekly mag/infusion;  

## 2018-02-17 NOTE — Patient Instructions (Signed)
Magnesium Sulfate injection What is this medicine? MAGNESIUM SULFATE (mag NEE zee um SUL fate) is an electrolyte injection commonly used to treat low magnesium levels in your blood. It is also used to prevent or control seizures in women with preeclampsia or eclampsia. This medicine may be used for other purposes; ask your health care provider or pharmacist if you have questions. What should I tell my health care provider before I take this medicine? They need to know if you have any of these conditions: -heart disease -history of irregular heart beat -kidney disease -an unusual or allergic reaction to magnesium sulfate, medicines, foods, dyes, or preservatives -pregnant or trying to get pregnant -breast-feeding How should I use this medicine? This medicine is for infusion into a vein. It is given by a health care professional in a hospital or clinic setting. Talk to your pediatrician regarding the use of this medicine in children. While this drug may be prescribed for selected conditions, precautions do apply. Overdosage: If you think you have taken too much of this medicine contact a poison control center or emergency room at once. NOTE: This medicine is only for you. Do not share this medicine with others. What if I miss a dose? This does not apply. What may interact with this medicine? This medicine may interact with the following medications: -certain medicines for anxiety or sleep -certain medicines for seizures like phenobarbital -digoxin -medicines that relax muscles for surgery -narcotic medicines for pain This list may not describe all possible interactions. Give your health care provider a list of all the medicines, herbs, non-prescription drugs, or dietary supplements you use. Also tell them if you smoke, drink alcohol, or use illegal drugs. Some items may interact with your medicine. What should I watch for while using this medicine? Your condition will be monitored carefully  while you are receiving this medicine. You may need blood work done while you are receiving this medicine. What side effects may I notice from receiving this medicine? Side effects that you should report to your doctor or health care professional as soon as possible: -allergic reactions like skin rash, itching or hives, swelling of the face, lips, or tongue -facial flushing -muscle weakness -signs and symptoms of low blood pressure like dizziness; feeling faint or lightheaded, falls; unusually weak or tired -signs and symptoms of a dangerous change in heartbeat or heart rhythm like chest pain; dizziness; fast or irregular heartbeat; palpitations; breathing problems -sweating This list may not describe all possible side effects. Call your doctor for medical advice about side effects. You may report side effects to FDA at 1-800-FDA-1088. Where should I keep my medicine? This drug is given in a hospital or clinic and will not be stored at home. NOTE: This sheet is a summary. It may not cover all possible information. If you have questions about this medicine, talk to your doctor, pharmacist, or health care provider.  2018 Elsevier/Gold Standard (2016-01-03 12:31:42)  

## 2018-02-18 LAB — KAPPA/LAMBDA LIGHT CHAINS
Kappa free light chain: 22.2 mg/L — ABNORMAL HIGH (ref 3.3–19.4)
Kappa, lambda light chain ratio: 0.31 (ref 0.26–1.65)
Lambda free light chains: 70.7 mg/L — ABNORMAL HIGH (ref 5.7–26.3)

## 2018-02-19 LAB — MULTIPLE MYELOMA PANEL, SERUM
ALBUMIN/GLOB SERPL: 1.6 (ref 0.7–1.7)
Albumin SerPl Elph-Mcnc: 4.2 g/dL (ref 2.9–4.4)
Alpha 1: 0.3 g/dL (ref 0.0–0.4)
Alpha2 Glob SerPl Elph-Mcnc: 1 g/dL (ref 0.4–1.0)
B-GLOBULIN SERPL ELPH-MCNC: 1 g/dL (ref 0.7–1.3)
Gamma Glob SerPl Elph-Mcnc: 0.5 g/dL (ref 0.4–1.8)
Globulin, Total: 2.8 g/dL (ref 2.2–3.9)
IGG (IMMUNOGLOBIN G), SERUM: 527 mg/dL — AB (ref 700–1600)
IGM (IMMUNOGLOBULIN M), SRM: 22 mg/dL — AB (ref 26–217)
IgA: 135 mg/dL (ref 87–352)
TOTAL PROTEIN ELP: 7 g/dL (ref 6.0–8.5)

## 2018-02-24 ENCOUNTER — Inpatient Hospital Stay: Payer: Medicare Other

## 2018-02-24 VITALS — BP 106/66 | HR 68 | Resp 18

## 2018-02-24 DIAGNOSIS — C9001 Multiple myeloma in remission: Secondary | ICD-10-CM | POA: Diagnosis not present

## 2018-02-24 DIAGNOSIS — K5909 Other constipation: Secondary | ICD-10-CM | POA: Diagnosis not present

## 2018-02-24 DIAGNOSIS — N183 Chronic kidney disease, stage 3 (moderate): Secondary | ICD-10-CM | POA: Diagnosis not present

## 2018-02-24 DIAGNOSIS — R197 Diarrhea, unspecified: Secondary | ICD-10-CM | POA: Diagnosis not present

## 2018-02-24 DIAGNOSIS — R5383 Other fatigue: Secondary | ICD-10-CM | POA: Diagnosis not present

## 2018-02-24 LAB — MAGNESIUM: MAGNESIUM: 1.2 mg/dL — AB (ref 1.7–2.4)

## 2018-02-24 MED ORDER — MAGNESIUM SULFATE 4 GM/100ML IV SOLN
4.0000 g | Freq: Once | INTRAVENOUS | Status: AC
  Administered 2018-02-24: 4 g via INTRAVENOUS
  Filled 2018-02-24: qty 100

## 2018-02-24 MED ORDER — SODIUM CHLORIDE 0.9 % IV SOLN
Freq: Once | INTRAVENOUS | Status: AC
  Administered 2018-02-24: 09:00:00 via INTRAVENOUS
  Filled 2018-02-24: qty 250

## 2018-03-03 ENCOUNTER — Inpatient Hospital Stay: Payer: Medicare Other

## 2018-03-10 ENCOUNTER — Inpatient Hospital Stay: Payer: Medicare Other | Attending: Internal Medicine

## 2018-03-10 ENCOUNTER — Inpatient Hospital Stay: Payer: Medicare Other

## 2018-03-10 VITALS — BP 129/76 | HR 73 | Temp 97.0°F | Resp 18

## 2018-03-10 DIAGNOSIS — C9001 Multiple myeloma in remission: Secondary | ICD-10-CM

## 2018-03-10 LAB — MAGNESIUM: MAGNESIUM: 1.2 mg/dL — AB (ref 1.7–2.4)

## 2018-03-10 MED ORDER — MAGNESIUM SULFATE 4 GM/100ML IV SOLN
4.0000 g | Freq: Once | INTRAVENOUS | Status: AC
  Administered 2018-03-10: 4 g via INTRAVENOUS
  Filled 2018-03-10: qty 100

## 2018-03-10 MED ORDER — SODIUM CHLORIDE 0.9 % IV SOLN
Freq: Once | INTRAVENOUS | Status: AC
  Administered 2018-03-10: 09:00:00 via INTRAVENOUS
  Filled 2018-03-10: qty 250

## 2018-03-13 DIAGNOSIS — E119 Type 2 diabetes mellitus without complications: Secondary | ICD-10-CM | POA: Diagnosis not present

## 2018-03-13 LAB — HM DIABETES EYE EXAM

## 2018-03-16 ENCOUNTER — Other Ambulatory Visit: Payer: Self-pay | Admitting: Internal Medicine

## 2018-03-17 ENCOUNTER — Inpatient Hospital Stay: Payer: Medicare Other

## 2018-03-18 ENCOUNTER — Encounter: Payer: Self-pay | Admitting: Internal Medicine

## 2018-03-24 ENCOUNTER — Inpatient Hospital Stay: Payer: Medicare Other

## 2018-03-31 ENCOUNTER — Inpatient Hospital Stay: Payer: Medicare Other | Attending: Internal Medicine

## 2018-03-31 ENCOUNTER — Inpatient Hospital Stay: Payer: Medicare Other

## 2018-03-31 VITALS — BP 124/64 | HR 60 | Temp 96.4°F | Resp 20

## 2018-03-31 DIAGNOSIS — N183 Chronic kidney disease, stage 3 (moderate): Secondary | ICD-10-CM | POA: Diagnosis not present

## 2018-03-31 DIAGNOSIS — Z87891 Personal history of nicotine dependence: Secondary | ICD-10-CM | POA: Diagnosis not present

## 2018-03-31 DIAGNOSIS — C9001 Multiple myeloma in remission: Secondary | ICD-10-CM

## 2018-03-31 DIAGNOSIS — G8929 Other chronic pain: Secondary | ICD-10-CM | POA: Diagnosis not present

## 2018-03-31 DIAGNOSIS — E041 Nontoxic single thyroid nodule: Secondary | ICD-10-CM

## 2018-03-31 DIAGNOSIS — R5383 Other fatigue: Secondary | ICD-10-CM | POA: Diagnosis not present

## 2018-03-31 DIAGNOSIS — Z7984 Long term (current) use of oral hypoglycemic drugs: Secondary | ICD-10-CM | POA: Insufficient documentation

## 2018-03-31 DIAGNOSIS — R110 Nausea: Secondary | ICD-10-CM | POA: Insufficient documentation

## 2018-03-31 DIAGNOSIS — Z79899 Other long term (current) drug therapy: Secondary | ICD-10-CM | POA: Diagnosis not present

## 2018-03-31 DIAGNOSIS — M549 Dorsalgia, unspecified: Secondary | ICD-10-CM | POA: Diagnosis not present

## 2018-03-31 DIAGNOSIS — I130 Hypertensive heart and chronic kidney disease with heart failure and stage 1 through stage 4 chronic kidney disease, or unspecified chronic kidney disease: Secondary | ICD-10-CM | POA: Diagnosis not present

## 2018-03-31 DIAGNOSIS — K529 Noninfective gastroenteritis and colitis, unspecified: Secondary | ICD-10-CM | POA: Diagnosis not present

## 2018-03-31 DIAGNOSIS — K5900 Constipation, unspecified: Secondary | ICD-10-CM | POA: Diagnosis not present

## 2018-03-31 DIAGNOSIS — Z23 Encounter for immunization: Secondary | ICD-10-CM

## 2018-03-31 DIAGNOSIS — Z833 Family history of diabetes mellitus: Secondary | ICD-10-CM | POA: Diagnosis not present

## 2018-03-31 DIAGNOSIS — Z7982 Long term (current) use of aspirin: Secondary | ICD-10-CM | POA: Insufficient documentation

## 2018-03-31 DIAGNOSIS — E1122 Type 2 diabetes mellitus with diabetic chronic kidney disease: Secondary | ICD-10-CM | POA: Insufficient documentation

## 2018-03-31 LAB — MAGNESIUM: Magnesium: 1.2 mg/dL — ABNORMAL LOW (ref 1.7–2.4)

## 2018-03-31 MED ORDER — INFLUENZA VAC SPLIT QUAD 0.5 ML IM SUSY
0.5000 mL | PREFILLED_SYRINGE | Freq: Once | INTRAMUSCULAR | Status: AC
  Administered 2018-03-31: 0.5 mL via INTRAMUSCULAR

## 2018-03-31 MED ORDER — SODIUM CHLORIDE 0.9 % IV SOLN
Freq: Once | INTRAVENOUS | Status: AC
  Administered 2018-03-31: 09:00:00 via INTRAVENOUS
  Filled 2018-03-31: qty 250

## 2018-03-31 MED ORDER — MAGNESIUM SULFATE 4 GM/100ML IV SOLN
4.0000 g | Freq: Once | INTRAVENOUS | Status: AC
  Administered 2018-03-31: 4 g via INTRAVENOUS
  Filled 2018-03-31: qty 100

## 2018-03-31 NOTE — Patient Instructions (Signed)
Hypomagnesemia  Hypomagnesemia is a condition in which the level of magnesium in the blood is low. Magnesium is a mineral that is found in many foods. It is used in many different processes in the body. Hypomagnesemia can affect every organ in the body. It can cause life-threatening problems.  What are the causes?  Causes of hypomagnesemia include:   Not getting enough magnesium in your diet.   Malnutrition.   Problems with absorbing magnesium from the intestines.   Dehydration.   Alcohol abuse.   Vomiting.   Severe diarrhea.   Some medicines, including medicines that make you urinate more.   Certain diseases, such as kidney disease, diabetes, and overactive thyroid.    What are the signs or symptoms?   Involuntary shaking or trembling of a body part (tremor).   Confusion.   Muscle weakness.   Sensitivity to light, sound, and touch.   Psychiatric issues, such as depression, irritability, or psychosis.   Sudden tightening of muscles (muscle spasms).   Tingling in the arms and legs.   A feeling of fluttering of the heart.  These symptoms are more severe if magnesium levels drop suddenly.  How is this diagnosed?  To make a diagnosis, your health care provider will do a physical exam and order blood and urine tests.  How is this treated?  Treatment will depend on the cause and the severity of your condition. It may involve:   A magnesium supplement. This can be taken in pill form. It can also be given through an IV tube. This is usually done if the condition is severe.   Changes to your diet. You may be directed to eat foods that have a lot of magnesium, such as green leafy vegetables, peas, beans, and nuts.   Eliminating alcohol from your diet.    Follow these instructions at home:   Include foods with magnesium in your diet. Foods that are rich in magnesium include green vegetables, beans, nuts and seeds, and whole grains.   Take medicines only as directed by your health care provider.   Take  magnesium supplements if your health care provider instructs you to do that. Take them as directed.   Have your magnesium levels monitored as directed by your health care provider.   When you are active, drink fluids that contain electrolytes.   Keep all follow-up visits as directed by your health care provider. This is important.  Contact a health care provider if:   You get worse instead of better.   Your symptoms return.  Get help right away if:   Your symptoms are severe.  This information is not intended to replace advice given to you by your health care provider. Make sure you discuss any questions you have with your health care provider.  Document Released: 03/13/2005 Document Revised: 11/23/2015 Document Reviewed: 01/31/2014  Elsevier Interactive Patient Education  2018 [employer]vier [employer]

## 2018-04-01 DIAGNOSIS — I428 Other cardiomyopathies: Secondary | ICD-10-CM | POA: Diagnosis not present

## 2018-04-01 DIAGNOSIS — Q231 Congenital insufficiency of aortic valve: Secondary | ICD-10-CM | POA: Diagnosis not present

## 2018-04-01 DIAGNOSIS — I495 Sick sinus syndrome: Secondary | ICD-10-CM | POA: Diagnosis not present

## 2018-04-01 DIAGNOSIS — I447 Left bundle-branch block, unspecified: Secondary | ICD-10-CM | POA: Diagnosis not present

## 2018-04-01 DIAGNOSIS — I10 Essential (primary) hypertension: Secondary | ICD-10-CM | POA: Diagnosis not present

## 2018-04-07 ENCOUNTER — Telehealth: Payer: Self-pay | Admitting: Internal Medicine

## 2018-04-07 ENCOUNTER — Other Ambulatory Visit: Payer: Self-pay | Admitting: Internal Medicine

## 2018-04-07 ENCOUNTER — Inpatient Hospital Stay: Payer: Medicare Other

## 2018-04-07 DIAGNOSIS — R5383 Other fatigue: Secondary | ICD-10-CM | POA: Diagnosis not present

## 2018-04-07 DIAGNOSIS — C9001 Multiple myeloma in remission: Secondary | ICD-10-CM

## 2018-04-07 DIAGNOSIS — G8929 Other chronic pain: Secondary | ICD-10-CM | POA: Diagnosis not present

## 2018-04-07 DIAGNOSIS — K5900 Constipation, unspecified: Secondary | ICD-10-CM | POA: Diagnosis not present

## 2018-04-07 DIAGNOSIS — K529 Noninfective gastroenteritis and colitis, unspecified: Secondary | ICD-10-CM | POA: Diagnosis not present

## 2018-04-07 LAB — MAGNESIUM: Magnesium: 1.5 mg/dL — ABNORMAL LOW (ref 1.7–2.4)

## 2018-04-07 NOTE — Addendum Note (Signed)
Addended by: Renita Papa R on: 04/07/2018 11:56 AM   Modules accepted: Orders

## 2018-04-07 NOTE — Telephone Encounter (Signed)
Robin-please move the patient's provider appointment from the 15th to 22nd/to see me; also add magnesium infusion  #Keep appointment for the 15th for magnesium infusion.   Brooke please make sure patient has CBC/CMP/mag on 22nd; magnesium levels/infusion on 15th

## 2018-04-08 LAB — KAPPA/LAMBDA LIGHT CHAINS
KAPPA FREE LGHT CHN: 17.5 mg/L (ref 3.3–19.4)
Kappa, lambda light chain ratio: 0.27 (ref 0.26–1.65)
LAMDA FREE LIGHT CHAINS: 64.2 mg/L — AB (ref 5.7–26.3)

## 2018-04-08 LAB — MULTIPLE MYELOMA PANEL, SERUM
ALBUMIN SERPL ELPH-MCNC: 4 g/dL (ref 2.9–4.4)
Albumin/Glob SerPl: 1.7 (ref 0.7–1.7)
Alpha 1: 0.2 g/dL (ref 0.0–0.4)
Alpha2 Glob SerPl Elph-Mcnc: 0.9 g/dL (ref 0.4–1.0)
B-Globulin SerPl Elph-Mcnc: 1 g/dL (ref 0.7–1.3)
Gamma Glob SerPl Elph-Mcnc: 0.5 g/dL (ref 0.4–1.8)
Globulin, Total: 2.5 g/dL (ref 2.2–3.9)
IGG (IMMUNOGLOBIN G), SERUM: 537 mg/dL — AB (ref 700–1600)
IgA: 133 mg/dL (ref 87–352)
IgM (Immunoglobulin M), Srm: 19 mg/dL — ABNORMAL LOW (ref 26–217)
TOTAL PROTEIN ELP: 6.5 g/dL (ref 6.0–8.5)

## 2018-04-14 ENCOUNTER — Inpatient Hospital Stay: Payer: Medicare Other

## 2018-04-14 ENCOUNTER — Other Ambulatory Visit: Payer: Self-pay

## 2018-04-14 ENCOUNTER — Inpatient Hospital Stay (HOSPITAL_BASED_OUTPATIENT_CLINIC_OR_DEPARTMENT_OTHER): Payer: Medicare Other | Admitting: Internal Medicine

## 2018-04-14 VITALS — BP 134/69 | HR 64 | Resp 20

## 2018-04-14 DIAGNOSIS — R110 Nausea: Secondary | ICD-10-CM | POA: Diagnosis not present

## 2018-04-14 DIAGNOSIS — C9001 Multiple myeloma in remission: Secondary | ICD-10-CM

## 2018-04-14 DIAGNOSIS — K529 Noninfective gastroenteritis and colitis, unspecified: Secondary | ICD-10-CM

## 2018-04-14 DIAGNOSIS — G8929 Other chronic pain: Secondary | ICD-10-CM | POA: Diagnosis not present

## 2018-04-14 DIAGNOSIS — M549 Dorsalgia, unspecified: Secondary | ICD-10-CM | POA: Diagnosis not present

## 2018-04-14 DIAGNOSIS — E1122 Type 2 diabetes mellitus with diabetic chronic kidney disease: Secondary | ICD-10-CM | POA: Diagnosis not present

## 2018-04-14 DIAGNOSIS — N183 Chronic kidney disease, stage 3 (moderate): Secondary | ICD-10-CM

## 2018-04-14 DIAGNOSIS — I130 Hypertensive heart and chronic kidney disease with heart failure and stage 1 through stage 4 chronic kidney disease, or unspecified chronic kidney disease: Secondary | ICD-10-CM | POA: Diagnosis not present

## 2018-04-14 DIAGNOSIS — Z833 Family history of diabetes mellitus: Secondary | ICD-10-CM

## 2018-04-14 DIAGNOSIS — Z79899 Other long term (current) drug therapy: Secondary | ICD-10-CM

## 2018-04-14 DIAGNOSIS — Z7984 Long term (current) use of oral hypoglycemic drugs: Secondary | ICD-10-CM

## 2018-04-14 DIAGNOSIS — Z7982 Long term (current) use of aspirin: Secondary | ICD-10-CM

## 2018-04-14 DIAGNOSIS — R5383 Other fatigue: Secondary | ICD-10-CM | POA: Diagnosis not present

## 2018-04-14 DIAGNOSIS — K521 Toxic gastroenteritis and colitis: Secondary | ICD-10-CM

## 2018-04-14 DIAGNOSIS — Z87891 Personal history of nicotine dependence: Secondary | ICD-10-CM

## 2018-04-14 DIAGNOSIS — K5900 Constipation, unspecified: Secondary | ICD-10-CM | POA: Diagnosis not present

## 2018-04-14 DIAGNOSIS — T451X5A Adverse effect of antineoplastic and immunosuppressive drugs, initial encounter: Secondary | ICD-10-CM

## 2018-04-14 LAB — MAGNESIUM: Magnesium: 1.2 mg/dL — ABNORMAL LOW (ref 1.7–2.4)

## 2018-04-14 MED ORDER — SODIUM CHLORIDE 0.9 % IV SOLN
Freq: Once | INTRAVENOUS | Status: AC
  Administered 2018-04-14: 09:00:00 via INTRAVENOUS
  Filled 2018-04-14: qty 250

## 2018-04-14 MED ORDER — DIPHENOXYLATE-ATROPINE 2.5-0.025 MG PO TABS: 2.0000 | ORAL_TABLET | Freq: Every day | ORAL | 2 refills | Status: DC | PRN

## 2018-04-14 MED ORDER — MAGNESIUM SULFATE 4 GM/100ML IV SOLN
4.0000 g | Freq: Once | INTRAVENOUS | Status: AC
  Administered 2018-04-14: 4 g via INTRAVENOUS
  Filled 2018-04-14: qty 100

## 2018-04-14 NOTE — Progress Notes (Signed)
Sarah [name] OFFICE PROGRESS NOTE  Patient Care Team: Glean Hess, MD as PCP - General (Internal Medicine) Cammie Sickle, MD as Consulting Physician (Oncology) Jodell Cipro, MD as Referring Physician (Pain Medicine) Corey Skains, MD as Consulting Physician (Cardiology)  Cancer Staging No matching staging information was found for the patient.   Oncology History   # June 2017- [facility] MULTIPLE MYELOMA [BMBx- 80% plasma cells; [facility]]; [place]da light chain 1340; s/p RVD   # 14th DEC 2016- Auto-Stem cell transplant [[facility];Dr.Hertzig]; rev [dose reduced sec to [place]  # Maintenance Revlimid- 52m 3 w-ON & 1 week OFF; HELD July 2018- sec to ONJ  # Bone lesions-numerous ~569mlytic lesions in Thoracic spine- on Zometa  # 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  # "Colitis" s/p multiple EGD/Colonoscopies; EGD- patchy inflammation/colo- Nov 2018 [Dr.Anna]  DIAGNOSIS: Multiple myeloma  GOALS: Control/palliative  CURRENT/MOST RECENT THERAPY -surveillance      Multiple myeloma in remission ([facility]     INTERVAL HISTORY:  [name]89.o.  female pleasant patient above history of multiple myeloma currently on surveillance; off maintenance Revlimid because of ONJ is here for follow-up.  Patient continues to complain of chronic mild fatigue.  Chronic diarrhea/intermittent constipation.  Chronic nausea.  She continues to have tingling and numbness of the extremities-unfortunately she stumbled a couple of times the last few weeks; and fell.  Did not hit her head.  Review of Systems  Constitutional: Positive for malaise/fatigue. Negative for chills, diaphoresis, fever and weight loss.  HENT: Negative for nosebleeds and sore throat.   Eyes: Negative  for double vision.  Respiratory: Negative for cough, hemoptysis, sputum production, shortness of breath and wheezing.   Cardiovascular: Negative for chest pain, palpitations, orthopnea and leg swelling.  Gastrointestinal: Positive for constipation and diarrhea. Negative for abdominal pain, blood in stool, heartburn, melena, nausea and vomiting.  Genitourinary: Negative for dysuria, frequency and urgency.  Musculoskeletal: Positive for back pain and joint pain.  Skin: Negative.  Negative for itching and rash.  Neurological: Negative for dizziness, tingling, focal weakness, weakness and headaches.  Endo/Heme/Allergies: Does not bruise/bleed easily.  Psychiatric/Behavioral: Negative for depression. The patient is not nervous/anxious and does not have insomnia.       PAST MEDICAL HISTORY :  Past Medical History:  Diagnosis Date  . Anemia   . Anxiety   . Arthritis   . Bicuspid aortic valve   . CHF (congestive heart failure) ([facility]  . CKD (chronic kidney disease) stage 3, GFR 30-59 ml/min (HCC)   . Depression   . Diabetes mellitus ([facility]  . Dizziness   . Fatty liver   . Frequent falls   . GERD (gastroesophageal reflux disease)   . Gout   . Heart murmur   . History of blood transfusion   . [place] of bone marrow transplant ([facility]  . History of uterine fibroid   . Hypertension   . Hypomagnesemia   . Multiple myeloma ([facility]  . Personal history of chemotherapy   . Renal cyst     PAST SURGICAL HISTORY :   Past Surgical History:  Procedure Laterality Date  . ABDOMINAL HYSTERECTOMY    . 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 CARDIAC ELECTROPHYSIOLOGY MAPPING AND ABLATION    .  CARPAL TUNNEL RELEASE Bilateral   . CHOLECYSTECTOMY  2008  . COLONOSCOPY WITH PROPOFOL N/A 05/07/2017   Procedure: COLONOSCOPY WITH PROPOFOL;  Surgeon: [name], MD;  [facility]COPY;  Service: Gastroenterology;  Laterality: N/A;  . ESOPHAGOGASTRODUODENOSCOPY (EGD) WITH PROPOFOL N/A 05/07/2017    Procedure: ESOPHAGOGASTRODUODENOSCOPY (EGD) WITH PROPOFOL;  Surgeon: [name], MD;  Location: [facility] ENDOSCOPY;  Service: Gastroenterology;  Laterality: N/A;  . FOOT SURGERY Bilateral   . INCONTINENCE SURGERY  2009  . PARTIAL HYSTERECTOMY  03/1996   fibroids  . TONSILLECTOMY  2007    FAMILY HISTORY :   Family History  Problem Relation Age of Onset  . Colon cancer Father   . Renal Disease Father   . Diabetes Mellitus II Father   . Melanoma Paternal Grandmother   . Breast cancer Maternal Aunt 57  . Anemia Mother   . Heart disease Mother   . Heart failure Mother   . Renal Disease Mother   . Congestive Heart Failure Mother   . Heart disease Maternal Uncle   . Throat cancer Maternal Uncle   . Lung cancer Maternal Uncle   . Liver disease Maternal Uncle   . Heart failure Maternal Uncle   . Hearing loss Son 13       Suicide     SOCIAL HISTORY:   Social History   Tobacco Use  . Smoking status: Former Smoker    Packs/day: 1.00    Years: 20.00    Pack years: 20.00    Types: Cigarettes    Last attempt to quit: 07/02/1991    Years since quitting: 26.8  . Smokeless tobacco: Never Used  Substance Use Topics  . Alcohol use: Yes    Alcohol/week: 2.0 standard drinks    Types: 1 Glasses of wine, 1 Cans of beer per week    Comment: occassional  . Drug use: No    ALLERGIES:  is allergic to oxycodone-acetaminophen; codeine; benadryl [diphenhydramine]; morphine; ondansetron; and tylenol [acetaminophen].  MEDICATIONS:  Current Outpatient Medications  Medication Sig Dispense Refill  . allopurinol (ZYLOPRIM) 100 MG tablet TAKE 1 TABLET(100 MG) BY MOUTH DAILY as needed 30 tablet 5  . aspirin 81 MG chewable tablet Chew 1 tablet (81 mg total) by mouth daily. 30 tablet 0  . bisoprolol (ZEBETA) 10 MG tablet TAKE 1 TABLET(10 MG) BY MOUTH DAILY 90 tablet 1  . diphenoxylate-atropine (LOMOTIL) 2.5-0.025 MG tablet Take 2 tablets by mouth daily as needed for diarrhea or loose stools. 45 tablet 2   . DULoxetine (CYMBALTA) 30 MG capsule TAKE 3 CAPSULES(90 MG) BY MOUTH DAILY 90 capsule 3  . fentaNYL (DURAGESIC - DOSED MCG/HR) 25 MCG/HR patch Place 25 mcg onto the skin every 3 (three) days.    . fexofenadine (ALLEGRA) 180 MG tablet TAKE 1 TABLET(180 MG) BY MOUTH DAILY 30 tablet 5  . fluticasone (FLONASE) 50 MCG/ACT nasal spray Place 2 sprays into both nostrils daily. 16 g 0  . glucose blood (ONE TOUCH ULTRA TEST) test strip     . hydrOXYzine (ATARAX/VISTARIL) 10 MG tablet TAKE 1 TABLET(10 MG) BY MOUTH THREE TIMES DAILY AS NEEDED 90 tablet 0  . lisinopril (PRINIVIL,ZESTRIL) 10 MG tablet Take 10 mg by mouth daily.    Marland Kitchen MAGNESIUM CL-CALCIUM CARBONATE PO Take 1 tablet by mouth daily.    . Menaquinone-7 (VITAMIN K2) 100 MCG CAPS Take by mouth.    . metFORMIN (GLUCOPHAGE-XR) 500 MG 24 hr tablet Take 1 tablet (500 mg total) by mouth daily with breakfast.  30 tablet 4  . Misc Natural Products (OSTEO BI-FLEX ADV TRIPLE ST PO) Take 2 tablets by mouth daily.    . Multiple Vitamins-Minerals (HAIR/SKIN/NAILS/BIOTIN PO) Take 1 capsule by mouth 3 (three) times daily.    . NON FORMULARY     . Omega 3-6-9 Fatty Acids (OMEGA 3-6-9 COMPLEX) CAPS Take by mouth.    Marland Kitchen omeprazole (PRILOSEC) 40 MG capsule Take 1 capsule (40 mg total) by mouth daily. 30 capsule 5  . pravastatin (PRAVACHOL) 20 MG tablet TAKE 1 TABLET(20 MG) BY MOUTH DAILY 30 tablet 12  . pregabalin (LYRICA) 50 MG capsule Take 100 mg by mouth daily.    . promethazine (PHENERGAN) 25 MG tablet Take 1-2 tablets by mouth twice daily before meals as needed 60 tablet 3  . tiZANidine (ZANAFLEX) 4 MG tablet TAKE 1 TABLET BY MOUTH EVERY 8 HOURS AS NEEDED, MUSCLE RELAXER FOR BACK PAIN 90 tablet 5  . Valerian Root 500 MG CAPS Take 1 capsule by mouth daily.     . zaleplon (SONATA) 10 MG capsule TAKE 1 CAPSULE BY MOUTH EVERY DAY AT BEDTIME AS NEEDED FOR SLEEP 30 capsule 5   No current facility-administered medications for this visit.     PHYSICAL  EXAMINATION: ECOG PERFORMANCE STATUS: 1 - Symptomatic but completely ambulatory  BP 130/79 (BP Location: Left Arm, Patient Position: Sitting)   Pulse 60   Temp 97.8 F (36.6 C) (Tympanic)   Resp 20   Ht _0  (1.575 m)   Wt 163 lb 2.3 oz (74 kg)   BMI 29.84 kg/m   Filed Weights   04/14/18 0833  Weight: 163 lb 2.3 oz (74 kg)    Physical Exam  Constitutional: She is oriented to person, place, and time and well-developed, well-nourished, and in no distress.  HENT:  Head: Normocephalic and atraumatic.  Mouth/Throat: Oropharynx is clear and moist. No oropharyngeal exudate.  Eyes: Pupils are equal, round, and reactive to light.  Neck: Normal range of motion. Neck supple.  Cardiovascular: Normal rate and regular rhythm.  Pulmonary/Chest: No respiratory distress. She has no wheezes.  Abdominal: Soft. Bowel sounds are normal. She exhibits no distension and no mass. There is no tenderness. There is no rebound and no guarding.  Musculoskeletal: Normal range of motion. She exhibits no edema or tenderness.  Neurological: She is alert and oriented to person, place, and time.  Skin: Skin is warm.  Psychiatric: Affect normal.       LABORATORY DATA:  I have reviewed the data as listed    Component Value Date/Time   NA 139 02/17/2018 0857   NA 141 04/19/2016 1604   K 4.7 02/17/2018 0857   CL 107 02/17/2018 0857   CO2 20 (L) 02/17/2018 0857   GLUCOSE 163 (H) 02/17/2018 0857   BUN 40 (H) 02/17/2018 0857   BUN 22 04/19/2016 1604   CREATININE 1.48 (H) 02/17/2018 0857   CALCIUM 9.1 02/17/2018 0857   PROT 7.5 02/17/2018 0857   ALBUMIN 4.3 02/17/2018 0857   AST 63 (H) 02/17/2018 0857   ALT 92 (H) 02/17/2018 0857   ALKPHOS 97 02/17/2018 0857   BILITOT 0.4 02/17/2018 0857   GFRNONAA 37 (L) 02/17/2018 0857   GFRAA 43 (L) 02/17/2018 0857    No results found for: SPEP, UPEP  Lab Results  Component Value Date   WBC 5.3 02/17/2018   NEUTROABS 3.6 02/17/2018   HGB 12.3 02/17/2018    HCT 36.6 02/17/2018   MCV 90.8 02/17/2018   PLT 132 (  L) 02/17/2018      Chemistry      Component Value Date/Time   NA 139 02/17/2018 0857   NA 141 04/19/2016 1604   K 4.7 02/17/2018 0857   CL 107 02/17/2018 0857   CO2 20 (L) 02/17/2018 0857   BUN 40 (H) 02/17/2018 0857   BUN 22 04/19/2016 1604   CREATININE 1.48 (H) 02/17/2018 0857      Component Value Date/Time   CALCIUM 9.1 02/17/2018 0857   ALKPHOS 97 02/17/2018 0857   AST 63 (H) 02/17/2018 0857   ALT 92 (H) 02/17/2018 0857   BILITOT 0.4 02/17/2018 0857       RADIOGRAPHIC STUDIES: I have personally reviewed the radiological images as listed and agreed with the findings in the report. No results found.   ASSESSMENT & PLAN:  Multiple myeloma in remission ([place]) Multiple myeloma IgA lambda status post autologous stem cell transplant in December 2016 [[facility]].  Currently on surveillance.AUGUST- 2019 M protein no evidence of myeloma.  STABLE.   #If recurrence is noted- daratumumab plus IMID next line of therapy  #ONJ August 2018-continue to hold bisphosphonate; STABLE.   # Nausea chronic on Phenergan 25 mg prn. STABLE.   # PN-1-2; sec to chemo/stem cell transplant- on lyrica. STABLE.   # Chronic diarrhea/constipation alternating-STABLE:   # Hypomagnesia- [symptomatic- palipi/cramps].  Cont mag 4 gm weekly. STABLE.   # Chronic CKD stage III-stable.  # Chronic back pain-follows up with pain clinic.  Stable.  # DISPOSITION:  # mag today #  every 2 weeks mag level/ infusion;  #  follow up in 2 months-MD- /myeloma panel; k-l light chains; cbc/cmp   No orders of the defined types were placed in this encounter.  All questions were answered. The patient knows to call the clinic with any problems, questions or concerns.      Cammie Sickle, MD 04/14/2018 8:55 AM

## 2018-04-14 NOTE — Assessment & Plan Note (Signed)
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  Currently on surveillance.AUGUST- 2019 M protein no evidence of myeloma.  STABLE.   #If recurrence is noted- daratumumab plus IMID next line of therapy  #ONJ August 2018-continue to hold bisphosphonate; STABLE.   # Nausea chronic on Phenergan 25 mg prn. STABLE.   # PN-1-2; sec to chemo/stem cell transplant- on lyrica. STABLE.   # Chronic diarrhea/constipation alternating-STABLE:   # Hypomagnesia- [symptomatic- palipi/cramps].  Cont mag 4 gm weekly. STABLE.   # Chronic CKD stage III-stable.  # Chronic back pain-follows up with pain clinic.  Stable.  # DISPOSITION:  # mag today #  every 2 weeks mag level/ infusion;  #  follow up in 2 months-MD- /myeloma panel; k-l light chains; cbc/cmp

## 2018-04-14 NOTE — Patient Instructions (Signed)
Magnesium Sulfate injection What is this medicine? MAGNESIUM SULFATE (mag NEE zee um SUL fate) is an electrolyte injection commonly used to treat low magnesium levels in your blood. It is also used to prevent or control seizures in women with preeclampsia or eclampsia. This medicine may be used for other purposes; ask your health care provider or pharmacist if you have questions. What should I tell my health care provider before I take this medicine? They need to know if you have any of these conditions: -heart disease -history of irregular heart beat -kidney disease -an unusual or allergic reaction to magnesium sulfate, medicines, foods, dyes, or preservatives -pregnant or trying to get pregnant -breast-feeding How should I use this medicine? This medicine is for infusion into a vein. It is given by a health care professional in a hospital or clinic setting. Talk to your pediatrician regarding the use of this medicine in children. While this drug may be prescribed for selected conditions, precautions do apply. Overdosage: If you think you have taken too much of this medicine contact a poison control center or emergency room at once. NOTE: This medicine is only for you. Do not share this medicine with others. What if I miss a dose? This does not apply. What may interact with this medicine? This medicine may interact with the following medications: -certain medicines for anxiety or sleep -certain medicines for seizures like phenobarbital -digoxin -medicines that relax muscles for surgery -narcotic medicines for pain This list may not describe all possible interactions. Give your health care provider a list of all the medicines, herbs, non-prescription drugs, or dietary supplements you use. Also tell them if you smoke, drink alcohol, or use illegal drugs. Some items may interact with your medicine. What should I watch for while using this medicine? Your condition will be monitored carefully  while you are receiving this medicine. You may need blood work done while you are receiving this medicine. What side effects may I notice from receiving this medicine? Side effects that you should report to your doctor or health care professional as soon as possible: -allergic reactions like skin rash, itching or hives, swelling of the face, lips, or tongue -facial flushing -muscle weakness -signs and symptoms of low blood pressure like dizziness; feeling faint or lightheaded, falls; unusually weak or tired -signs and symptoms of a dangerous change in heartbeat or heart rhythm like chest pain; dizziness; fast or irregular heartbeat; palpitations; breathing problems -sweating This list may not describe all possible side effects. Call your doctor for medical advice about side effects. You may report side effects to FDA at 1-800-FDA-1088. Where should I keep my medicine? This drug is given in a hospital or clinic and will not be stored at home. NOTE: This sheet is a summary. It may not cover all possible information. If you have questions about this medicine, talk to your doctor, pharmacist, or health care provider.  2018 Elsevier/Gold Standard (2016-01-03 12:31:42)  

## 2018-04-16 ENCOUNTER — Other Ambulatory Visit: Payer: Self-pay

## 2018-04-16 DIAGNOSIS — F418 Other specified anxiety disorders: Secondary | ICD-10-CM

## 2018-04-16 DIAGNOSIS — E1122 Type 2 diabetes mellitus with diabetic chronic kidney disease: Secondary | ICD-10-CM

## 2018-04-16 DIAGNOSIS — N182 Chronic kidney disease, stage 2 (mild): Secondary | ICD-10-CM

## 2018-04-16 DIAGNOSIS — I6523 Occlusion and stenosis of bilateral carotid arteries: Secondary | ICD-10-CM

## 2018-04-16 MED ORDER — PRAVASTATIN SODIUM 20 MG PO TABS: ORAL_TABLET | ORAL | 0 refills | Status: DC

## 2018-04-16 MED ORDER — BISOPROLOL FUMARATE 10 MG PO TABS: ORAL_TABLET | ORAL | 0 refills | Status: DC

## 2018-04-16 MED ORDER — DULOXETINE HCL 30 MG PO CPEP: ORAL_CAPSULE | ORAL | 0 refills | Status: DC

## 2018-04-16 MED ORDER — METFORMIN HCL ER 500 MG PO TB24: 500.0000 mg | ORAL_TABLET | Freq: Every day | ORAL | 0 refills | Status: DC

## 2018-04-17 ENCOUNTER — Telehealth: Payer: Self-pay

## 2018-04-17 ENCOUNTER — Other Ambulatory Visit: Payer: Self-pay | Admitting: Internal Medicine

## 2018-04-17 MED ORDER — TIZANIDINE HCL 4 MG PO TABS: 4.0000 mg | ORAL_TABLET | Freq: Three times a day (TID) | ORAL | 3 refills | Status: DC | PRN

## 2018-04-17 NOTE — Telephone Encounter (Signed)
Did PA through [email or web address] for patient medication: Cymbalta 30 mg capsules TID.   Will receive response through fax to our office within 3 business days. Awaiting outcome for this.  CNM

## 2018-04-19 ENCOUNTER — Other Ambulatory Visit: Payer: Self-pay | Admitting: Internal Medicine

## 2018-04-19 DIAGNOSIS — N182 Chronic kidney disease, stage 2 (mild): Principal | ICD-10-CM

## 2018-04-19 DIAGNOSIS — E1122 Type 2 diabetes mellitus with diabetic chronic kidney disease: Secondary | ICD-10-CM

## 2018-04-20 ENCOUNTER — Other Ambulatory Visit: Payer: Self-pay | Admitting: Internal Medicine

## 2018-04-20 DIAGNOSIS — I6523 Occlusion and stenosis of bilateral carotid arteries: Secondary | ICD-10-CM

## 2018-04-20 NOTE — Telephone Encounter (Signed)
PA Approved through [email or web address]  Patient informed.  ([facility]) - [id number]

## 2018-04-28 ENCOUNTER — Inpatient Hospital Stay: Payer: Medicare Other

## 2018-04-28 VITALS — BP 133/77 | HR 75 | Resp 20

## 2018-04-28 DIAGNOSIS — R5383 Other fatigue: Secondary | ICD-10-CM | POA: Diagnosis not present

## 2018-04-28 DIAGNOSIS — G8929 Other chronic pain: Secondary | ICD-10-CM | POA: Diagnosis not present

## 2018-04-28 DIAGNOSIS — C9001 Multiple myeloma in remission: Secondary | ICD-10-CM | POA: Diagnosis not present

## 2018-04-28 DIAGNOSIS — K529 Noninfective gastroenteritis and colitis, unspecified: Secondary | ICD-10-CM | POA: Diagnosis not present

## 2018-04-28 DIAGNOSIS — K5900 Constipation, unspecified: Secondary | ICD-10-CM | POA: Diagnosis not present

## 2018-04-28 LAB — CBC WITH DIFFERENTIAL/PLATELET
Abs Immature Granulocytes: 0.02 10*3/uL (ref 0.00–0.07)
BASOS PCT: 0 %
Basophils Absolute: 0 10*3/uL (ref 0.0–0.1)
EOS ABS: 0.1 10*3/uL (ref 0.0–0.5)
EOS PCT: 2 %
HCT: 34.8 % — ABNORMAL LOW (ref 36.0–46.0)
Hemoglobin: 11.5 g/dL — ABNORMAL LOW (ref 12.0–15.0)
IMMATURE GRANULOCYTES: 0 %
LYMPHS ABS: 0.9 10*3/uL (ref 0.7–4.0)
Lymphocytes Relative: 18 %
MCH: 30 pg (ref 26.0–34.0)
MCHC: 33 g/dL (ref 30.0–36.0)
MCV: 90.9 fL (ref 80.0–100.0)
MONOS PCT: 8 %
Monocytes Absolute: 0.4 10*3/uL (ref 0.1–1.0)
NRBC: 0 % (ref 0.0–0.2)
Neutro Abs: 3.8 10*3/uL (ref 1.7–7.7)
Neutrophils Relative %: 72 %
PLATELETS: 119 10*3/uL — AB (ref 150–400)
RBC: 3.83 MIL/uL — ABNORMAL LOW (ref 3.87–5.11)
RDW: 13.1 % (ref 11.5–15.5)
WBC: 5.3 10*3/uL (ref 4.0–10.5)

## 2018-04-28 LAB — COMPREHENSIVE METABOLIC PANEL
ALT: 41 U/L (ref 0–44)
AST: 39 U/L (ref 15–41)
Albumin: 4.4 g/dL (ref 3.5–5.0)
Alkaline Phosphatase: 94 U/L (ref 38–126)
Anion gap: 11 (ref 5–15)
BUN: 22 mg/dL (ref 8–23)
CO2: 23 mmol/L (ref 22–32)
Calcium: 9.4 mg/dL (ref 8.9–10.3)
Chloride: 104 mmol/L (ref 98–111)
Creatinine, Ser: 1.13 mg/dL — ABNORMAL HIGH (ref 0.44–1.00)
GFR calc non Af Amer: 51 mL/min — ABNORMAL LOW (ref >60)
GFR, EST AFRICAN AMERICAN: 60 mL/min — AB (ref >60)
Glucose, Bld: 158 mg/dL — ABNORMAL HIGH (ref 70–99)
Potassium: 4.9 mmol/L (ref 3.5–5.1)
SODIUM: 138 mmol/L (ref 135–145)
Total Bilirubin: 0.9 mg/dL (ref 0.3–1.2)
Total Protein: 7.2 g/dL (ref 6.5–8.1)

## 2018-04-28 LAB — MAGNESIUM: Magnesium: 1.2 mg/dL — ABNORMAL LOW (ref 1.7–2.4)

## 2018-04-28 MED ORDER — MAGNESIUM SULFATE 4 GM/100ML IV SOLN
4.0000 g | Freq: Once | INTRAVENOUS | Status: AC
  Administered 2018-04-28: 4 g via INTRAVENOUS
  Filled 2018-04-28: qty 100

## 2018-04-28 MED ORDER — SODIUM CHLORIDE 0.9 % IV SOLN
Freq: Once | INTRAVENOUS | Status: AC
  Administered 2018-04-28: 11:00:00 via INTRAVENOUS
  Filled 2018-04-28: qty 250

## 2018-05-07 ENCOUNTER — Other Ambulatory Visit: Payer: Self-pay | Admitting: Internal Medicine

## 2018-05-07 DIAGNOSIS — E119 Type 2 diabetes mellitus without complications: Secondary | ICD-10-CM | POA: Diagnosis not present

## 2018-05-07 DIAGNOSIS — I10 Essential (primary) hypertension: Secondary | ICD-10-CM | POA: Diagnosis not present

## 2018-05-07 DIAGNOSIS — R809 Proteinuria, unspecified: Secondary | ICD-10-CM | POA: Diagnosis not present

## 2018-05-07 DIAGNOSIS — N183 Chronic kidney disease, stage 3 (moderate): Secondary | ICD-10-CM | POA: Diagnosis not present

## 2018-05-12 ENCOUNTER — Inpatient Hospital Stay: Payer: Medicare Other

## 2018-05-12 ENCOUNTER — Inpatient Hospital Stay: Payer: Medicare Other | Attending: Internal Medicine

## 2018-05-12 VITALS — BP 129/82 | HR 69 | Temp 95.0°F | Resp 20

## 2018-05-12 DIAGNOSIS — C9001 Multiple myeloma in remission: Secondary | ICD-10-CM | POA: Diagnosis not present

## 2018-05-12 LAB — CBC WITH DIFFERENTIAL/PLATELET
ABS IMMATURE GRANULOCYTES: 0.05 10*3/uL (ref 0.00–0.07)
BASOS PCT: 0 %
Basophils Absolute: 0 10*3/uL (ref 0.0–0.1)
Eosinophils Absolute: 0.1 10*3/uL (ref 0.0–0.5)
Eosinophils Relative: 2 %
HCT: 34.8 % — ABNORMAL LOW (ref 36.0–46.0)
HEMOGLOBIN: 11.6 g/dL — AB (ref 12.0–15.0)
IMMATURE GRANULOCYTES: 1 %
LYMPHS PCT: 18 %
Lymphs Abs: 0.9 10*3/uL (ref 0.7–4.0)
MCH: 30.1 pg (ref 26.0–34.0)
MCHC: 33.3 g/dL (ref 30.0–36.0)
MCV: 90.2 fL (ref 80.0–100.0)
MONOS PCT: 7 %
Monocytes Absolute: 0.3 10*3/uL (ref 0.1–1.0)
NEUTROS ABS: 3.7 10*3/uL (ref 1.7–7.7)
NEUTROS PCT: 72 %
PLATELETS: 125 10*3/uL — AB (ref 150–400)
RBC: 3.86 MIL/uL — AB (ref 3.87–5.11)
RDW: 13.2 % (ref 11.5–15.5)
WBC: 5.1 10*3/uL (ref 4.0–10.5)
nRBC: 0 % (ref 0.0–0.2)

## 2018-05-12 LAB — COMPREHENSIVE METABOLIC PANEL
ALBUMIN: 4.5 g/dL (ref 3.5–5.0)
ALT: 42 U/L (ref 0–44)
ANION GAP: 11 (ref 5–15)
AST: 44 U/L — AB (ref 15–41)
Alkaline Phosphatase: 95 U/L (ref 38–126)
BILIRUBIN TOTAL: 0.4 mg/dL (ref 0.3–1.2)
BUN: 30 mg/dL — AB (ref 8–23)
CHLORIDE: 105 mmol/L (ref 98–111)
CO2: 23 mmol/L (ref 22–32)
Calcium: 9.3 mg/dL (ref 8.9–10.3)
Creatinine, Ser: 1.12 mg/dL — ABNORMAL HIGH (ref 0.44–1.00)
GFR calc Af Amer: >60 mL/min (ref >60)
GFR calc non Af Amer: 52 mL/min — ABNORMAL LOW (ref >60)
GLUCOSE: 211 mg/dL — AB (ref 70–99)
POTASSIUM: 4.5 mmol/L (ref 3.5–5.1)
Sodium: 139 mmol/L (ref 135–145)
TOTAL PROTEIN: 7.3 g/dL (ref 6.5–8.1)

## 2018-05-12 LAB — MAGNESIUM: MAGNESIUM: 1.1 mg/dL — AB (ref 1.7–2.4)

## 2018-05-12 MED ORDER — MAGNESIUM SULFATE 4 GM/100ML IV SOLN
4.0000 g | Freq: Once | INTRAVENOUS | Status: AC
  Administered 2018-05-12: 4 g via INTRAVENOUS
  Filled 2018-05-12: qty 100

## 2018-05-12 MED ORDER — SODIUM CHLORIDE 0.9 % IV SOLN
Freq: Once | INTRAVENOUS | Status: AC
  Administered 2018-05-12: 11:00:00 via INTRAVENOUS
  Filled 2018-05-12: qty 250

## 2018-05-12 NOTE — Patient Instructions (Signed)
Magnesium Sulfate injection What is this medicine? MAGNESIUM SULFATE (mag NEE zee um SUL fate) is an electrolyte injection commonly used to treat low magnesium levels in your blood. It is also used to prevent or control seizures in women with preeclampsia or eclampsia. This medicine may be used for other purposes; ask your health care provider or pharmacist if you have questions. What should I tell my health care provider before I take this medicine? They need to know if you have any of these conditions: -heart disease -history of irregular heart beat -kidney disease -an unusual or allergic reaction to magnesium sulfate, medicines, foods, dyes, or preservatives -pregnant or trying to get pregnant -breast-feeding How should I use this medicine? This medicine is for infusion into a vein. It is given by a health care professional in a hospital or clinic setting. Talk to your pediatrician regarding the use of this medicine in children. While this drug may be prescribed for selected conditions, precautions do apply. Overdosage: If you think you have taken too much of this medicine contact a poison control center or emergency room at once. NOTE: This medicine is only for you. Do not share this medicine with others. What if I miss a dose? This does not apply. What may interact with this medicine? This medicine may interact with the following medications: -certain medicines for anxiety or sleep -certain medicines for seizures like phenobarbital -digoxin -medicines that relax muscles for surgery -narcotic medicines for pain This list may not describe all possible interactions. Give your health care provider a list of all the medicines, herbs, non-prescription drugs, or dietary supplements you use. Also tell them if you smoke, drink alcohol, or use illegal drugs. Some items may interact with your medicine. What should I watch for while using this medicine? Your condition will be monitored carefully  while you are receiving this medicine. You may need blood work done while you are receiving this medicine. What side effects may I notice from receiving this medicine? Side effects that you should report to your doctor or health care professional as soon as possible: -allergic reactions like skin rash, itching or hives, swelling of the face, lips, or tongue -facial flushing -muscle weakness -signs and symptoms of low blood pressure like dizziness; feeling faint or lightheaded, falls; unusually weak or tired -signs and symptoms of a dangerous change in heartbeat or heart rhythm like chest pain; dizziness; fast or irregular heartbeat; palpitations; breathing problems -sweating This list may not describe all possible side effects. Call your doctor for medical advice about side effects. You may report side effects to FDA at 1-800-FDA-1088. Where should I keep my medicine? This drug is given in a hospital or clinic and will not be stored at home. NOTE: This sheet is a summary. It may not cover all possible information. If you have questions about this medicine, talk to your doctor, pharmacist, or health care provider.  2018 Elsevier/Gold Standard (2016-01-03 12:31:42)  

## 2018-05-13 DIAGNOSIS — Z885 Allergy status to narcotic agent status: Secondary | ICD-10-CM | POA: Diagnosis not present

## 2018-05-13 DIAGNOSIS — K219 Gastro-esophageal reflux disease without esophagitis: Secondary | ICD-10-CM | POA: Diagnosis not present

## 2018-05-13 DIAGNOSIS — M25511 Pain in right shoulder: Secondary | ICD-10-CM | POA: Diagnosis not present

## 2018-05-13 DIAGNOSIS — M7062 Trochanteric bursitis, left hip: Secondary | ICD-10-CM | POA: Diagnosis not present

## 2018-05-13 DIAGNOSIS — M542 Cervicalgia: Secondary | ICD-10-CM | POA: Diagnosis not present

## 2018-05-13 DIAGNOSIS — G620 Drug-induced polyneuropathy: Secondary | ICD-10-CM | POA: Diagnosis not present

## 2018-05-13 DIAGNOSIS — Z79899 Other long term (current) drug therapy: Secondary | ICD-10-CM | POA: Diagnosis not present

## 2018-05-13 DIAGNOSIS — Z79891 Long term (current) use of opiate analgesic: Secondary | ICD-10-CM | POA: Diagnosis not present

## 2018-05-13 DIAGNOSIS — M47816 Spondylosis without myelopathy or radiculopathy, lumbar region: Secondary | ICD-10-CM | POA: Diagnosis not present

## 2018-05-13 DIAGNOSIS — Z6830 Body mass index (BMI) 30.0-30.9, adult: Secondary | ICD-10-CM | POA: Diagnosis not present

## 2018-05-13 DIAGNOSIS — M4312 Spondylolisthesis, cervical region: Secondary | ICD-10-CM | POA: Diagnosis not present

## 2018-05-13 DIAGNOSIS — M48061 Spinal stenosis, lumbar region without neurogenic claudication: Secondary | ICD-10-CM | POA: Diagnosis not present

## 2018-05-13 DIAGNOSIS — M533 Sacrococcygeal disorders, not elsewhere classified: Secondary | ICD-10-CM | POA: Diagnosis not present

## 2018-05-13 DIAGNOSIS — T451X5D Adverse effect of antineoplastic and immunosuppressive drugs, subsequent encounter: Secondary | ICD-10-CM | POA: Diagnosis not present

## 2018-05-13 DIAGNOSIS — G8929 Other chronic pain: Secondary | ICD-10-CM | POA: Diagnosis not present

## 2018-05-13 DIAGNOSIS — M47817 Spondylosis without myelopathy or radiculopathy, lumbosacral region: Secondary | ICD-10-CM | POA: Diagnosis not present

## 2018-05-13 DIAGNOSIS — M47812 Spondylosis without myelopathy or radiculopathy, cervical region: Secondary | ICD-10-CM | POA: Diagnosis not present

## 2018-05-18 ENCOUNTER — Other Ambulatory Visit: Payer: Self-pay | Admitting: Internal Medicine

## 2018-05-18 DIAGNOSIS — F418 Other specified anxiety disorders: Secondary | ICD-10-CM

## 2018-05-20 ENCOUNTER — Other Ambulatory Visit: Payer: Self-pay | Admitting: Internal Medicine

## 2018-05-20 DIAGNOSIS — C9001 Multiple myeloma in remission: Secondary | ICD-10-CM

## 2018-05-21 DIAGNOSIS — M9971 Connective tissue and disc stenosis of intervertebral foramina of cervical region: Secondary | ICD-10-CM | POA: Diagnosis not present

## 2018-05-21 DIAGNOSIS — M4802 Spinal stenosis, cervical region: Secondary | ICD-10-CM | POA: Diagnosis not present

## 2018-05-21 DIAGNOSIS — M5412 Radiculopathy, cervical region: Secondary | ICD-10-CM | POA: Diagnosis not present

## 2018-05-26 ENCOUNTER — Inpatient Hospital Stay: Payer: Medicare Other

## 2018-05-26 VITALS — BP 119/72 | HR 67 | Resp 20

## 2018-05-26 DIAGNOSIS — C9001 Multiple myeloma in remission: Secondary | ICD-10-CM | POA: Diagnosis not present

## 2018-05-26 LAB — COMPREHENSIVE METABOLIC PANEL
ALT: 42 U/L (ref 0–44)
ANION GAP: 10 (ref 5–15)
AST: 38 U/L (ref 15–41)
Albumin: 4.7 g/dL (ref 3.5–5.0)
Alkaline Phosphatase: 97 U/L (ref 38–126)
BUN: 25 mg/dL — ABNORMAL HIGH (ref 8–23)
CHLORIDE: 103 mmol/L (ref 98–111)
CO2: 25 mmol/L (ref 22–32)
CREATININE: 1.06 mg/dL — AB (ref 0.44–1.00)
Calcium: 8.9 mg/dL (ref 8.9–10.3)
GFR, EST NON AFRICAN AMERICAN: 57 mL/min — AB (ref >60)
Glucose, Bld: 157 mg/dL — ABNORMAL HIGH (ref 70–99)
Potassium: 4.8 mmol/L (ref 3.5–5.1)
SODIUM: 138 mmol/L (ref 135–145)
Total Bilirubin: 0.3 mg/dL (ref 0.3–1.2)
Total Protein: 7.6 g/dL (ref 6.5–8.1)

## 2018-05-26 LAB — CBC WITH DIFFERENTIAL/PLATELET
Abs Immature Granulocytes: 0.03 10*3/uL (ref 0.00–0.07)
BASOS ABS: 0 10*3/uL (ref 0.0–0.1)
Basophils Relative: 0 %
EOS ABS: 0.1 10*3/uL (ref 0.0–0.5)
EOS PCT: 1 %
HEMATOCRIT: 35.1 % — AB (ref 36.0–46.0)
Hemoglobin: 11.7 g/dL — ABNORMAL LOW (ref 12.0–15.0)
IMMATURE GRANULOCYTES: 1 %
LYMPHS ABS: 1 10*3/uL (ref 0.7–4.0)
LYMPHS PCT: 18 %
MCH: 29.8 pg (ref 26.0–34.0)
MCHC: 33.3 g/dL (ref 30.0–36.0)
MCV: 89.5 fL (ref 80.0–100.0)
Monocytes Absolute: 0.4 10*3/uL (ref 0.1–1.0)
Monocytes Relative: 7 %
NEUTROS PCT: 73 %
NRBC: 0 % (ref 0.0–0.2)
Neutro Abs: 4.1 10*3/uL (ref 1.7–7.7)
Platelets: 127 10*3/uL — ABNORMAL LOW (ref 150–400)
RBC: 3.92 MIL/uL (ref 3.87–5.11)
RDW: 13 % (ref 11.5–15.5)
WBC: 5.7 10*3/uL (ref 4.0–10.5)

## 2018-05-26 LAB — MAGNESIUM: MAGNESIUM: 1.1 mg/dL — AB (ref 1.7–2.4)

## 2018-05-26 MED ORDER — SODIUM CHLORIDE 0.9 % IV SOLN
Freq: Once | INTRAVENOUS | Status: AC
  Administered 2018-05-26: 11:00:00 via INTRAVENOUS
  Filled 2018-05-26: qty 250

## 2018-05-26 MED ORDER — MAGNESIUM SULFATE 4 GM/100ML IV SOLN
4.0000 g | Freq: Once | INTRAVENOUS | Status: AC
  Administered 2018-05-26: 4 g via INTRAVENOUS
  Filled 2018-05-26: qty 100

## 2018-05-26 NOTE — Patient Instructions (Signed)
Magnesium Sulfate injection What is this medicine? MAGNESIUM SULFATE (mag NEE zee um SUL fate) is an electrolyte injection commonly used to treat low magnesium levels in your blood. It is also used to prevent or control seizures in women with preeclampsia or eclampsia. This medicine may be used for other purposes; ask your health care provider or pharmacist if you have questions. What should I tell my health care provider before I take this medicine? They need to know if you have any of these conditions: -heart disease -history of irregular heart beat -kidney disease -an unusual or allergic reaction to magnesium sulfate, medicines, foods, dyes, or preservatives -pregnant or trying to get pregnant -breast-feeding How should I use this medicine? This medicine is for infusion into a vein. It is given by a health care professional in a hospital or clinic setting. Talk to your pediatrician regarding the use of this medicine in children. While this drug may be prescribed for selected conditions, precautions do apply. Overdosage: If you think you have taken too much of this medicine contact a poison control center or emergency room at once. NOTE: This medicine is only for you. Do not share this medicine with others. What if I miss a dose? This does not apply. What may interact with this medicine? This medicine may interact with the following medications: -certain medicines for anxiety or sleep -certain medicines for seizures like phenobarbital -digoxin -medicines that relax muscles for surgery -narcotic medicines for pain This list may not describe all possible interactions. Give your health care provider a list of all the medicines, herbs, non-prescription drugs, or dietary supplements you use. Also tell them if you smoke, drink alcohol, or use illegal drugs. Some items may interact with your medicine. What should I watch for while using this medicine? Your condition will be monitored carefully  while you are receiving this medicine. You may need blood work done while you are receiving this medicine. What side effects may I notice from receiving this medicine? Side effects that you should report to your doctor or health care professional as soon as possible: -allergic reactions like skin rash, itching or hives, swelling of the face, lips, or tongue -facial flushing -muscle weakness -signs and symptoms of low blood pressure like dizziness; feeling faint or lightheaded, falls; unusually weak or tired -signs and symptoms of a dangerous change in heartbeat or heart rhythm like chest pain; dizziness; fast or irregular heartbeat; palpitations; breathing problems -sweating This list may not describe all possible side effects. Call your doctor for medical advice about side effects. You may report side effects to FDA at 1-800-FDA-1088. Where should I keep my medicine? This drug is given in a hospital or clinic and will not be stored at home. NOTE: This sheet is a summary. It may not cover all possible information. If you have questions about this medicine, talk to your doctor, pharmacist, or health care provider.  2018 Elsevier/Gold Standard (2016-01-03 12:31:42)  

## 2018-05-27 DIAGNOSIS — M48061 Spinal stenosis, lumbar region without neurogenic claudication: Secondary | ICD-10-CM | POA: Diagnosis not present

## 2018-05-27 DIAGNOSIS — M47817 Spondylosis without myelopathy or radiculopathy, lumbosacral region: Secondary | ICD-10-CM | POA: Diagnosis not present

## 2018-05-27 LAB — KAPPA/LAMBDA LIGHT CHAINS
KAPPA, LAMDA LIGHT CHAIN RATIO: 0.18 — AB (ref 0.26–1.65)
Kappa free light chain: 14 mg/L (ref 3.3–19.4)
LAMDA FREE LIGHT CHAINS: 78.9 mg/L — AB (ref 5.7–26.3)

## 2018-06-01 LAB — MULTIPLE MYELOMA PANEL, SERUM
ALBUMIN SERPL ELPH-MCNC: 4.2 g/dL (ref 2.9–4.4)
ALPHA 1: 0.2 g/dL (ref 0.0–0.4)
Albumin/Glob SerPl: 1.7 (ref 0.7–1.7)
Alpha2 Glob SerPl Elph-Mcnc: 0.9 g/dL (ref 0.4–1.0)
B-Globulin SerPl Elph-Mcnc: 1 g/dL (ref 0.7–1.3)
Gamma Glob SerPl Elph-Mcnc: 0.4 g/dL (ref 0.4–1.8)
Globulin, Total: 2.5 g/dL (ref 2.2–3.9)
IGM (IMMUNOGLOBULIN M), SRM: 18 mg/dL — AB (ref 26–217)
IgA: 140 mg/dL (ref 87–352)
IgG (Immunoglobin G), Serum: 533 mg/dL — ABNORMAL LOW (ref 700–1600)
Total Protein ELP: 6.7 g/dL (ref 6.0–8.5)

## 2018-06-02 ENCOUNTER — Other Ambulatory Visit: Payer: Medicare Other

## 2018-06-06 ENCOUNTER — Other Ambulatory Visit: Payer: Self-pay | Admitting: Internal Medicine

## 2018-06-09 ENCOUNTER — Other Ambulatory Visit: Payer: Medicare Other

## 2018-06-09 ENCOUNTER — Ambulatory Visit: Payer: Medicare Other | Admitting: Internal Medicine

## 2018-06-09 ENCOUNTER — Ambulatory Visit: Payer: Medicare Other

## 2018-06-10 ENCOUNTER — Other Ambulatory Visit: Payer: Self-pay | Admitting: Internal Medicine

## 2018-06-10 ENCOUNTER — Encounter: Payer: Self-pay | Admitting: Internal Medicine

## 2018-06-11 ENCOUNTER — Ambulatory Visit (INDEPENDENT_AMBULATORY_CARE_PROVIDER_SITE_OTHER): Payer: Medicare Other | Admitting: Internal Medicine

## 2018-06-11 ENCOUNTER — Encounter: Payer: Self-pay | Admitting: Internal Medicine

## 2018-06-11 VITALS — BP 118/78 | HR 75 | Temp 98.7°F | Ht 62.0 in | Wt 160.0 lb

## 2018-06-11 DIAGNOSIS — I6523 Occlusion and stenosis of bilateral carotid arteries: Secondary | ICD-10-CM

## 2018-06-11 DIAGNOSIS — J029 Acute pharyngitis, unspecified: Secondary | ICD-10-CM | POA: Diagnosis not present

## 2018-06-11 DIAGNOSIS — I428 Other cardiomyopathies: Secondary | ICD-10-CM

## 2018-06-11 DIAGNOSIS — Z1159 Encounter for screening for other viral diseases: Secondary | ICD-10-CM

## 2018-06-11 DIAGNOSIS — I639 Cerebral infarction, unspecified: Secondary | ICD-10-CM

## 2018-06-11 DIAGNOSIS — E1122 Type 2 diabetes mellitus with diabetic chronic kidney disease: Secondary | ICD-10-CM | POA: Diagnosis not present

## 2018-06-11 DIAGNOSIS — I10 Essential (primary) hypertension: Secondary | ICD-10-CM | POA: Diagnosis not present

## 2018-06-11 DIAGNOSIS — I429 Cardiomyopathy, unspecified: Secondary | ICD-10-CM

## 2018-06-11 DIAGNOSIS — N182 Chronic kidney disease, stage 2 (mild): Secondary | ICD-10-CM | POA: Diagnosis not present

## 2018-06-11 DIAGNOSIS — R0981 Nasal congestion: Secondary | ICD-10-CM | POA: Diagnosis not present

## 2018-06-11 MED ORDER — FLUTICASONE PROPIONATE 50 MCG/ACT NA SUSP: 2.0000 | Freq: Every day | NASAL | 0 refills | Status: DC

## 2018-06-11 NOTE — Progress Notes (Signed)
Date:  06/11/2018   Name:  Sarah [name]   DOB:  09/20/1955   MRN:  [id number]   Chief Complaint: Hypertension (Follow up.); Diabetes; and Sore Throat (Over a week. Nose running and sore throat. Painful to swallow. Gums and teeth are sore. )  Diabetes  She presents for her follow-up diabetic visit. She has type 2 diabetes mellitus. Pertinent negatives for hypoglycemia include no headaches or tremors. Pertinent negatives for diabetes include no chest pain, no fatigue, no foot paresthesias, no polydipsia and no polyuria. Current diabetic treatment includes oral agent (monotherapy). She is compliant with treatment all of the time. She monitors blood glucose at home 1-2 x per week. Her breakfast blood glucose is taken between 6-7 am. Her breakfast blood glucose range is generally 130-140 mg/dl. An ACE inhibitor/angiotensin II receptor blocker is being taken.  Hyperlipidemia  This is a chronic problem. The problem is controlled. Pertinent negatives include no chest pain or shortness of breath. Current antihyperlipidemic treatment includes statins. The current treatment provides significant improvement of lipids.  Sore Throat   This is a new problem. The current episode started in the past 7 days. The problem has been unchanged. Neither side of throat is experiencing more pain than the other. There has been no fever. The pain is mild. Pertinent negatives include no abdominal pain, coughing, headaches, shortness of breath or trouble swallowing. She has tried nothing for the symptoms.  Hypertension  This is a chronic problem. The problem is controlled. Pertinent negatives include no chest pain, headaches, palpitations or shortness of breath. Past treatments include beta blockers and ACE inhibitors. The current treatment provides significant improvement.  CAS/hyperlipidemia - on statin therapy. CHF - followed by cardiology. On ACE and beta blocker.  Seen in October with no change in therapy, no  labs.  Lab Results  Component Value Date   HGBA1C 7.3 (H) 02/09/2018   Lab Results  Component Value Date   CHOL 160 02/09/2018   HDL 72 02/09/2018   LDLCALC 70 02/09/2018   TRIG 91 02/09/2018   CHOLHDL 2.2 02/09/2018   Lab Results  Component Value Date   CREATININE 1.06 (H) 05/26/2018   BUN 25 (H) 05/26/2018   NA 138 05/26/2018   K 4.8 05/26/2018   CL 103 05/26/2018   CO2 25 05/26/2018   Lab Results  Component Value Date   WBC 5.7 05/26/2018   HGB 11.7 (L) 05/26/2018   HCT 35.1 (L) 05/26/2018   MCV 89.5 05/26/2018   PLT 127 (L) 05/26/2018      Review of Systems  Constitutional: Negative for appetite change, fatigue, fever and unexpected weight change.  HENT: Positive for postnasal drip and sore throat. Negative for tinnitus and trouble swallowing.   Eyes: Negative for visual disturbance.  Respiratory: Negative for cough, chest tightness and shortness of breath.   Cardiovascular: Negative for chest pain, palpitations and leg swelling.  Gastrointestinal: Negative for abdominal pain.  Endocrine: Negative for polydipsia and polyuria.  Genitourinary: Negative for dysuria.  Musculoskeletal: Negative for arthralgias.  Skin: Negative for color change and rash.  Neurological: Negative for tremors, numbness and headaches.  Hematological: Negative for adenopathy. Does not bruise/bleed easily.  Psychiatric/Behavioral: Negative for dysphoric mood.    Patient Active Problem List   Diagnosis Date Noted  . Thrombocytopenia ([place]) 02/09/2018  . Benign essential HTN 08/21/2017  . Bilateral carotid artery stenosis 08/21/2017  . Cardiomyopathy, idiopathic ([place]) 08/21/2017  . Carotid artery stenosis, asymptomatic, bilateral 08/06/2017  . Thyroid nodule 08/06/2017  .  Cardiac syncope 07/25/2017  . Iron deficiency anemia due to chronic blood loss 05/13/2017  . Spinal stenosis of lumbar region with neurogenic claudication 04/09/2017  . Magnesium deficiency 04/08/2017  .  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 LBBB (left bundle branch block) 10/10/2016  . Long term prescription opiate use 09/07/2016  . Chronic pain syndrome 07/08/2016  . Pain medication agreement signed 07/08/2016  . Chronic diastolic CHF (congestive heart failure), NYHA class 2 ([place]) 06/05/2016  . Hx of cardiac catheterization 06/05/2016  . LVH (left ventricular hypertrophy) due to hypertensive disease, with heart failure ([place]) 06/05/2016  . Mild aortic valve stenosis 06/05/2016  . 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[place]) 06/05/2016  . Sinus congestion 05/30/2016  . Environmental and seasonal allergies 04/19/2016  . Insomnia 04/19/2016  . Bicuspid aortic valve 04/19/2016  . Asthma 04/19/2016  . Aortic regurgitation 04/19/2016  . History of CHF (congestive heart failure) 04/19/2016  . DM (diabetes mellitus), type 2 with renal complications ([place]) 62/94/7654  . GERD (gastroesophageal reflux disease) 04/12/2016  . Depression with anxiety 04/12/2016  . Irritable bowel syndrome (IBS) 04/12/2016  . Hepatic steatosis 04/12/2016  . Multiple myeloma in remission ([place]) 04/02/2016  . 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[place]) 07/06/2015  . Colitis 02/14/2013  . Disequilibrium 01/14/2013    Allergies  Allergen Reactions  . Oxycodone-Acetaminophen Anaphylaxis    Swelling and rash  . Codeine   . Benadryl [Diphenhydramine] Palpitations  . Morphine Itching and Rash  . Ondansetron Diarrhea  . Tylenol [Acetaminophen] Itching and Rash    Past Surgical History:  Procedure Laterality Date  . ABDOMINAL HYSTERECTOMY    . 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 CARDIAC ELECTROPHYSIOLOGY MAPPING AND ABLATION    . CARPAL TUNNEL RELEASE Bilateral   . CHOLECYSTECTOMY  2008  . COLONOSCOPY WITH PROPOFOL N/A 05/07/2017   Procedure: COLONOSCOPY WITH PROPOFOL;  Surgeon: [name], MD;  Location[facility] ENDOSCOPY;  Service: Gastroenterology;  Laterality: N/A;  .  ESOPHAGOGASTRODUODENOSCOPY (EGD) WITH PROPOFOL N/A 05/07/2017   Procedure: ESOPHAGOGASTRODUODENOSCOPY (EGD) WITH PROPOFOL;  Surgeon: [name], MD;  Location: [facility] ENDOSCOPY;  Service: Gastroenterology;  Laterality: N/A;  . FOOT SURGERY Bilateral   . INCONTINENCE SURGERY  2009  . PARTIAL HYSTERECTOMY  03/1996   fibroids  . TONSILLECTOMY  2007    Social History   Tobacco Use  . Smoking status: Former Smoker    Packs/day: 1.00    Years: 20.00    Pack years: 20.00    Types: Cigarettes    Last attempt to quit: 07/02/1991    Years since quitting: 26.9  . Smokeless tobacco: Never Used  Substance Use Topics  . Alcohol use: Yes    Alcohol/week: 2.0 standard drinks    Types: 1 Glasses of wine, 1 Cans of beer per week    Comment: occassional  . Drug use: No     Medication list has been reviewed and updated.  Current Meds  Medication Sig  . allopurinol (ZYLOPRIM) 100 MG tablet TAKE 1 TABLET(100 MG) BY MOUTH DAILY as needed  . aspirin 81 MG chewable tablet Chew 1 tablet (81 mg total) by mouth daily.  . bisoprolol (ZEBETA) 10 MG tablet TAKE 1 TABLET(10 MG) BY MOUTH DAILY  . diphenoxylate-atropine (LOMOTIL) 2.5-0.025 MG tablet Take 2 tablets by mouth daily as needed for diarrhea or loose stools.  . DULoxetine (CYMBALTA) 30 MG capsule TAKE 3 CAPSULES BY MOUTH  DAILY  . fentaNYL (DURAGESIC - DOSED MCG/HR) 25 MCG/HR patch Place 25  mcg onto the skin every 3 (three) days.  . fexofenadine (ALLEGRA) 180 MG tablet TAKE 1 TABLET(180 MG) BY MOUTH DAILY  . fluticasone (FLONASE) 50 MCG/ACT nasal spray Place 2 sprays into both nostrils daily. (Patient taking differently: Place 2 sprays into both nostrils daily. PRN)  . glucose blood (ONE TOUCH ULTRA TEST) test strip   . hydrOXYzine (ATARAX/VISTARIL) 10 MG tablet TAKE 1 TABLET(10 MG) BY MOUTH THREE TIMES DAILY AS NEEDED  . lisinopril (PRINIVIL,ZESTRIL) 10 MG tablet Take 10 mg by mouth daily.  Marland Kitchen MAGNESIUM CL-CALCIUM CARBONATE PO Take 1 tablet by mouth  daily.  . Menaquinone-7 (VITAMIN K2) 100 MCG CAPS Take by mouth.  . metFORMIN (GLUCOPHAGE-XR) 500 MG 24 hr tablet TAKE 1 TABLET BY MOUTH  DAILY WITH BREAKFAST (Patient taking differently: Take 500 mg by mouth every evening. )  . Misc Natural Products (OSTEO BI-FLEX ADV TRIPLE ST PO) Take 2 tablets by mouth daily.  . Multiple Vitamins-Minerals (HAIR/SKIN/NAILS/BIOTIN PO) Take 1 capsule by mouth 3 (three) times daily.  . NON FORMULARY   . Omega 3-6-9 Fatty Acids (OMEGA 3-6-9 COMPLEX) CAPS Take by mouth.  Marland Kitchen omeprazole (PRILOSEC) 40 MG capsule Take 1 capsule (40 mg total) by mouth daily.  . pravastatin (PRAVACHOL) 20 MG tablet TAKE 1 TABLET BY MOUTH  DAILY  . pregabalin (LYRICA) 50 MG capsule Take 100 mg by mouth daily.  . promethazine (PHENERGAN) 25 MG tablet TAKE 1 TO 2 TABLETS BY MOUTH TWICE DAILY BEFORE MEALS AS NEEDED  . tiZANidine (ZANAFLEX) 4 MG tablet Take 1 tablet (4 mg total) by mouth every 8 (eight) hours as needed for muscle spasms.  Cristino Martes Root 500 MG CAPS Take 1 capsule by mouth daily.   . zaleplon (SONATA) 10 MG capsule TAKE 1 CAPSULE BY MOUTH EVERY DAY AT BEDTIME AS NEEDED FOR SLEEP    PHQ 2/9 Scores 02/09/2018 08/06/2017 08/06/2017 04/09/2017  PHQ - 2 Score 2 0 0 1  PHQ- 9 Score 10 6 0 5   Wt Readings from Last 3 Encounters:  06/11/18 160 lb (72.6 kg)  04/14/18 163 lb 2.3 oz (74 kg)  02/17/18 164 lb 14.5 oz (74.8 kg)    Physical Exam Vitals signs and nursing note reviewed.  Constitutional:      General: She is not in acute distress.    Appearance: She is well-developed.  HENT:     Head: Normocephalic and atraumatic.     Right Ear: Tympanic membrane and ear canal normal.     Left Ear: Tympanic membrane and ear canal normal.     Mouth/Throat:     Mouth: No oral lesions.     Pharynx: Posterior oropharyngeal erythema present. No oropharyngeal exudate.     Tonsils: No tonsillar exudate or tonsillar abscesses.  Eyes:     Conjunctiva/sclera: Conjunctivae normal.  Neck:       Musculoskeletal: Normal range of motion and neck supple.  Cardiovascular:     Rate and Rhythm: Normal rate and regular rhythm.  Pulmonary:     Effort: Pulmonary effort is normal. No respiratory distress.  Musculoskeletal: Normal range of motion.  Lymphadenopathy:     Cervical: No cervical adenopathy.  Skin:    General: Skin is warm and dry.     Findings: No rash.  Neurological:     Mental Status: She is alert and oriented to person, place, and time.  Psychiatric:        Behavior: Behavior normal.        Thought Content: Thought  content normal.     BP 118/78 (BP Location: Right Arm, Patient Position: Sitting, Cuff Size: Normal)   Pulse 75   Temp 98.7 F (37.1 C) (Oral)   Ht 5' 2"  (1.575 m)   Wt 160 lb (72.6 kg)   SpO2 99%   BMI 29.26 kg/m   Assessment and Plan: 1. Type 2 diabetes mellitus with stage 2 chronic kidney disease, without long-term current use of insulin (HCC) Continue metformin, FSBS 1-2 times per week - Hemoglobin A1c  2. Cardiomyopathy, idiopathic (HCC) stable  3. Carotid artery stenosis, asymptomatic, bilateral On statin therapy  4. Need for hepatitis C screening test - Hepatitis C antibody  5. Benign essential HTN controlled  6. Sore throat Appears to be PND - Allegra and Flnoase  7. Sinus congestion - fluticasone (FLONASE) 50 MCG/ACT nasal spray; Place 2 sprays into both nostrils daily.  Dispense: 16 g; Refill: 0   Partially dictated using Editor, commissioning. Any errors are unintentional.  Halina Maidens, MD [facility]  06/11/2018

## 2018-06-11 NOTE — Patient Instructions (Signed)
Allegra 180 mg daily Continue Flonase spary

## 2018-06-12 LAB — HEMOGLOBIN A1C
ESTIMATED AVERAGE GLUCOSE: 151 mg/dL
Hgb A1c MFr Bld: 6.9 % — ABNORMAL HIGH (ref 4.8–5.6)

## 2018-06-12 LAB — HEPATITIS C ANTIBODY: Hep C Virus Ab: <0.1 s/co ratio (ref 0.0–0.9)

## 2018-06-14 ENCOUNTER — Other Ambulatory Visit: Payer: Self-pay | Admitting: Oncology

## 2018-06-14 MED ORDER — LEVOFLOXACIN 500 MG PO TABS: 500.0000 mg | ORAL_TABLET | Freq: Every day | ORAL | 0 refills | Status: DC

## 2018-06-15 ENCOUNTER — Telehealth: Payer: Self-pay | Admitting: *Deleted

## 2018-06-15 ENCOUNTER — Inpatient Hospital Stay: Payer: Medicare Other

## 2018-06-15 ENCOUNTER — Encounter: Payer: Self-pay | Admitting: Nurse Practitioner

## 2018-06-15 ENCOUNTER — Other Ambulatory Visit: Payer: Self-pay | Admitting: *Deleted

## 2018-06-15 ENCOUNTER — Other Ambulatory Visit: Payer: Self-pay

## 2018-06-15 ENCOUNTER — Inpatient Hospital Stay: Payer: Medicare Other | Attending: Nurse Practitioner | Admitting: Nurse Practitioner

## 2018-06-15 VITALS — BP 135/78 | HR 81 | Temp 98.4°F | Resp 18 | Wt 161.0 lb

## 2018-06-15 DIAGNOSIS — E1122 Type 2 diabetes mellitus with diabetic chronic kidney disease: Secondary | ICD-10-CM | POA: Insufficient documentation

## 2018-06-15 DIAGNOSIS — Z7984 Long term (current) use of oral hypoglycemic drugs: Secondary | ICD-10-CM | POA: Diagnosis not present

## 2018-06-15 DIAGNOSIS — J069 Acute upper respiratory infection, unspecified: Secondary | ICD-10-CM | POA: Diagnosis not present

## 2018-06-15 DIAGNOSIS — R0981 Nasal congestion: Secondary | ICD-10-CM | POA: Diagnosis not present

## 2018-06-15 DIAGNOSIS — Z8249 Family history of ischemic heart disease and other diseases of the circulatory system: Secondary | ICD-10-CM | POA: Insufficient documentation

## 2018-06-15 DIAGNOSIS — Z87891 Personal history of nicotine dependence: Secondary | ICD-10-CM | POA: Diagnosis not present

## 2018-06-15 DIAGNOSIS — Z79899 Other long term (current) drug therapy: Secondary | ICD-10-CM

## 2018-06-15 DIAGNOSIS — R509 Fever, unspecified: Secondary | ICD-10-CM

## 2018-06-15 DIAGNOSIS — G8929 Other chronic pain: Secondary | ICD-10-CM | POA: Insufficient documentation

## 2018-06-15 DIAGNOSIS — K5900 Constipation, unspecified: Secondary | ICD-10-CM | POA: Diagnosis not present

## 2018-06-15 DIAGNOSIS — C9001 Multiple myeloma in remission: Secondary | ICD-10-CM | POA: Diagnosis not present

## 2018-06-15 DIAGNOSIS — K219 Gastro-esophageal reflux disease without esophagitis: Secondary | ICD-10-CM | POA: Diagnosis not present

## 2018-06-15 DIAGNOSIS — R05 Cough: Secondary | ICD-10-CM | POA: Insufficient documentation

## 2018-06-15 DIAGNOSIS — R5383 Other fatigue: Secondary | ICD-10-CM | POA: Diagnosis not present

## 2018-06-15 DIAGNOSIS — K529 Noninfective gastroenteritis and colitis, unspecified: Secondary | ICD-10-CM | POA: Diagnosis not present

## 2018-06-15 DIAGNOSIS — Z833 Family history of diabetes mellitus: Secondary | ICD-10-CM | POA: Insufficient documentation

## 2018-06-15 DIAGNOSIS — E119 Type 2 diabetes mellitus without complications: Secondary | ICD-10-CM | POA: Diagnosis not present

## 2018-06-15 DIAGNOSIS — Z7982 Long term (current) use of aspirin: Secondary | ICD-10-CM | POA: Insufficient documentation

## 2018-06-15 DIAGNOSIS — I130 Hypertensive heart and chronic kidney disease with heart failure and stage 1 through stage 4 chronic kidney disease, or unspecified chronic kidney disease: Secondary | ICD-10-CM | POA: Insufficient documentation

## 2018-06-15 DIAGNOSIS — N183 Chronic kidney disease, stage 3 (moderate): Secondary | ICD-10-CM | POA: Insufficient documentation

## 2018-06-15 DIAGNOSIS — Z9484 Stem cells transplant status: Secondary | ICD-10-CM | POA: Insufficient documentation

## 2018-06-15 DIAGNOSIS — Z5189 Encounter for other specified aftercare: Secondary | ICD-10-CM

## 2018-06-15 LAB — COMPREHENSIVE METABOLIC PANEL
ALT: 36 U/L (ref 0–44)
ANION GAP: 10 (ref 5–15)
AST: 27 U/L (ref 15–41)
Albumin: 4.2 g/dL (ref 3.5–5.0)
Alkaline Phosphatase: 97 U/L (ref 38–126)
BUN: 27 mg/dL — ABNORMAL HIGH (ref 8–23)
CO2: 22 mmol/L (ref 22–32)
Calcium: 9.2 mg/dL (ref 8.9–10.3)
Chloride: 105 mmol/L (ref 98–111)
Creatinine, Ser: 1.3 mg/dL — ABNORMAL HIGH (ref 0.44–1.00)
GFR calc Af Amer: 51 mL/min — ABNORMAL LOW (ref >60)
GFR calc non Af Amer: 44 mL/min — ABNORMAL LOW (ref >60)
Glucose, Bld: 119 mg/dL — ABNORMAL HIGH (ref 70–99)
Potassium: 4.6 mmol/L (ref 3.5–5.1)
SODIUM: 137 mmol/L (ref 135–145)
Total Bilirubin: 0.5 mg/dL (ref 0.3–1.2)
Total Protein: 7.1 g/dL (ref 6.5–8.1)

## 2018-06-15 LAB — CBC WITH DIFFERENTIAL/PLATELET
Abs Immature Granulocytes: 0.03 10*3/uL (ref 0.00–0.07)
BASOS ABS: 0 10*3/uL (ref 0.0–0.1)
Basophils Relative: 0 %
EOS ABS: 0.1 10*3/uL (ref 0.0–0.5)
Eosinophils Relative: 2 %
HCT: 34.7 % — ABNORMAL LOW (ref 36.0–46.0)
Hemoglobin: 11.6 g/dL — ABNORMAL LOW (ref 12.0–15.0)
Immature Granulocytes: 1 %
LYMPHS ABS: 1 10*3/uL (ref 0.7–4.0)
Lymphocytes Relative: 15 %
MCH: 29.5 pg (ref 26.0–34.0)
MCHC: 33.4 g/dL (ref 30.0–36.0)
MCV: 88.3 fL (ref 80.0–100.0)
Monocytes Absolute: 0.5 10*3/uL (ref 0.1–1.0)
Monocytes Relative: 9 %
NRBC: 0 % (ref 0.0–0.2)
Neutro Abs: 4.7 10*3/uL (ref 1.7–7.7)
Neutrophils Relative %: 73 %
Platelets: 104 10*3/uL — ABNORMAL LOW (ref 150–400)
RBC: 3.93 MIL/uL (ref 3.87–5.11)
RDW: 12.9 % (ref 11.5–15.5)
WBC: 6.3 10*3/uL (ref 4.0–10.5)

## 2018-06-15 MED ORDER — BENZONATATE 200 MG PO CAPS: 200.0000 mg | ORAL_CAPSULE | Freq: Three times a day (TID) | ORAL | 0 refills | Status: DC | PRN

## 2018-06-15 NOTE — Telephone Encounter (Signed)
Patient accepts appointment for 130ish today in [place] office

## 2018-06-15 NOTE — Telephone Encounter (Signed)
Sarah [name], Dr. B would like patient to be evaluated by [facility] NP.

## 2018-06-15 NOTE — Progress Notes (Signed)
Symptom Management [place]  Telephone:([phone] Fax:([phone]) [phone]  Patient Care Team: Sarah Hess, MD as PCP - General (Internal Medicine) Sarah Sickle, MD as Consulting Physician (Oncology) Sarah Cipro, MD as Referring Physician (Pain Medicine) Sarah Skains, MD as Consulting Physician (Cardiology)   Name of the patient: Sarah [name]  [id number]  1956-02-20   Date of visit: 06/15/18  Diagnosis- Multiple Myeloma  Chief complaint/ Reason for visit- Cough & Fever  Heme/Onc history:  Oncology History   # June 2017- IgALamda MULTIPLE MYELOMA [BMBx- 80% plasma cells; [facility]]; Lamda light chain 1340; s/p RVD   # 14th DEC 2016- Auto-Stem cell transplant [[facility];Dr.Hertzig]; rev [dose reduced sec to [place]augh  # Maintenance Revlimid- 82m 3 w-ON & 1 week OFF; HELD July 2018- sec to ONJ  # Bone lesions-numerous ~553mlytic lesions in Thoracic spine- on Zometa  # 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  # "Colitis" s/p multiple EGD/Colonoscopies; EGD- patchy inflammation/colo- Nov 2018 [Dr.Anna]  DIAGNOSIS: Multiple myeloma  GOALS: Control/palliative  CURRENT/MOST RECENT THERAPY -surveillance      [place]   Interval history- ChCarollee Nussbaumeris a [age]ear old female, with above history of multiple myeloma, currently on surveillance, who presents to Symptom Management for fever and cough.  She reports that symptoms started over the weekend.  Reports fever of 99-101.4.  Was started on Levaquin by Dr. FiGrayland Ormondn 06/14/2018.  She says cough is most bothersome.  Reports some sinus congestion.  Has been trying OTC medications without improvement in symptoms.  Says she has been using a cough medicine  with Phenergan and it from her sister which helps her symptoms.  She denies nausea, vomiting, constipation, diarrhea.  Denies urinary complaints.  ECOG FS:1 - Symptomatic but completely ambulatory  Review of systems- Review of Systems  Constitutional: Positive for chills, fever and malaise/fatigue.  HENT: Negative.   Eyes: Negative.   Respiratory: Positive for cough and sputum production. Negative for hemoptysis, shortness of breath and wheezing.   Cardiovascular: Negative.   Gastrointestinal: Negative.   Genitourinary: Negative.   Musculoskeletal: Negative.   Skin: Negative.   Neurological: Negative.   Endo/Heme/Allergies: Negative.   Psychiatric/Behavioral: Negative.      Current treatment- Surveillance  Allergies  Allergen Reactions  . Oxycodone-Acetaminophen Anaphylaxis    Swelling and rash  . Codeine   . Benadryl [Diphenhydramine] Palpitations  . Morphine Itching and Rash  . Ondansetron Diarrhea  . Tylenol [Acetaminophen] Itching and Rash    Past Medical History:  Diagnosis Date  . Abnormal stress test 02/14/2016   Overview:  Added automatically from request for surgery [id number]  . Anemia   . Anxiety   . Arthritis   . Bicuspid aortic valve   . CHF (congestive heart failure) ([facility]  . CKD (chronic kidney disease) stage 3, GFR 30-59 ml/min (HCC)   . Depression   . Diabetes mellitus ([facility]  . Dizziness   . Fatty liver   . Frequent falls   . GERD (gastroesophageal reflux disease)   . Gout   . Heart murmur   . History of blood transfusion   . [place] ([facility] [place]  . History of uterine fibroid   . Hypertension   .  Hypomagnesemia   . Multiple myeloma ([place])   . Personal history of chemotherapy   . Renal cyst     Past Surgical History:  Procedure Laterality Date  . ABDOMINAL HYSTERECTOMY    . 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 CARDIAC ELECTROPHYSIOLOGY MAPPING AND ABLATION    . CARPAL TUNNEL RELEASE Bilateral   . CHOLECYSTECTOMY  2008    . COLONOSCOPY WITH PROPOFOL N/A 05/07/2017   Procedure: COLONOSCOPY WITH PROPOFOL;  Surgeon: [name], MD;  Location[facility] ENDOSCOPY;  Service: Gastroenterology;  Laterality: N/A;  . ESOPHAGOGASTRODUODENOSCOPY (EGD) WITH PROPOFOL N/A 05/07/2017   Procedure: ESOPHAGOGASTRODUODENOSCOPY (EGD) WITH PROPOFOL;  Surgeon: [name], MD;  Location: [facility] ENDOSCOPY;  Service: Gastroenterology;  Laterality: N/A;  . FOOT SURGERY Bilateral   . INCONTINENCE SURGERY  2009  . PARTIAL HYSTERECTOMY  03/1996   fibroids  . TONSILLECTOMY  2007    Social History   Socioeconomic History  . Marital status: Widowed    Spouse name: Not on file  . Number of children: Not on file  . Years of education: Not on file  . Highest education level: Not on file  Occupational History  . Occupation: Disabled  Social Needs  . Financial resource strain: Not on file  . Food insecurity:    Worry: Not on file    Inability: Not on file  . Transportation needs:    Medical: Not on file    Non-medical: Not on file  Tobacco Use  . Smoking status: Former Smoker    Packs/day: 1.00    Years: 20.00    Pack years: 20.00    Types: Cigarettes    Last attempt to quit: 07/02/1991    Years since quitting: 26.9  . Smokeless tobacco: Never Used  Substance and Sexual Activity  . Alcohol use: Yes    Alcohol/week: 2.0 standard drinks    Types: 1 Glasses of wine, 1 Cans of beer per week    Comment: occassional  . Drug use: No  . Sexual activity: Never  Lifestyle  . Physical activity:    Days per week: Not on file    Minutes per session: Not on file  . Stress: Not on file  Relationships  . Social connections:    Talks on phone: Not on file    Gets together: Not on file    Attends religious service: Not on file    Active member of club or organization: Not on file    Attends meetings of clubs or organizations: Not on file    Relationship status: Not on file  . Intimate partner violence:    Fear of current or ex partner:  Not on file    Emotionally abused: Not on file    Physically abused: Not on file    Forced sexual activity: Not on file  Other Topics Concern  . Not on file  Social History Narrative  . Not on file    Family History  Problem Relation Age of Onset  . Colon cancer Father   . Renal Disease Father   . Diabetes Mellitus II Father   . Melanoma Paternal Grandmother   . Breast cancer Maternal Aunt 73  . Anemia Mother   . Heart disease Mother   . Heart failure Mother   . Renal Disease Mother   . Congestive Heart Failure Mother   . Heart disease Maternal Uncle   . Throat cancer Maternal Uncle   . Lung cancer Maternal Uncle   . Liver  disease Maternal Uncle   . Heart failure Maternal Uncle   . Hearing loss Son 18       Suicide      Current Outpatient Medications:  .  allopurinol (ZYLOPRIM) 100 MG tablet, TAKE 1 TABLET(100 MG) BY MOUTH DAILY as needed, Disp: 30 tablet, Rfl: 5 .  aspirin 81 MG chewable tablet, Chew 1 tablet (81 mg total) by mouth daily., Disp: 30 tablet, Rfl: 0 .  bisoprolol (ZEBETA) 10 MG tablet, TAKE 1 TABLET(10 MG) BY MOUTH DAILY, Disp: 90 tablet, Rfl: 0 .  diphenoxylate-atropine (LOMOTIL) 2.5-0.025 MG tablet, Take 2 tablets by mouth daily as needed for diarrhea or loose stools., Disp: 45 tablet, Rfl: 2 .  DULoxetine (CYMBALTA) 30 MG capsule, TAKE 3 CAPSULES BY MOUTH  DAILY, Disp: 90 capsule, Rfl: 0 .  fentaNYL (DURAGESIC - DOSED MCG/HR) 25 MCG/HR patch, Place 25 mcg onto the skin every 3 (three) days., Disp: , Rfl:  .  fexofenadine (ALLEGRA) 180 MG tablet, TAKE 1 TABLET(180 MG) BY MOUTH DAILY, Disp: 30 tablet, Rfl: 5 .  fluticasone (FLONASE) 50 MCG/ACT nasal spray, Place 2 sprays into both nostrils daily., Disp: 16 g, Rfl: 0 .  glucose blood (ONE TOUCH ULTRA TEST) test strip, , Disp: , Rfl:  .  hydrOXYzine (ATARAX/VISTARIL) 10 MG tablet, TAKE 1 TABLET(10 MG) BY MOUTH THREE TIMES DAILY AS NEEDED, Disp: 90 tablet, Rfl: 0 .  levofloxacin (LEVAQUIN) 500 MG tablet, Take 1  tablet (500 mg total) by mouth daily., Disp: 7 tablet, Rfl: 0 .  lisinopril (PRINIVIL,ZESTRIL) 10 MG tablet, Take 10 mg by mouth daily., Disp: , Rfl:  .  MAGNESIUM CL-CALCIUM CARBONATE PO, Take 1 tablet by mouth daily., Disp: , Rfl:  .  Menaquinone-7 (VITAMIN K2) 100 MCG CAPS, Take by mouth., Disp: , Rfl:  .  metFORMIN (GLUCOPHAGE-XR) 500 MG 24 hr tablet, TAKE 1 TABLET BY MOUTH  DAILY WITH BREAKFAST (Patient taking differently: Take 500 mg by mouth every evening. ), Disp: 90 tablet, Rfl: 3 .  Misc Natural Products (OSTEO BI-FLEX ADV TRIPLE ST PO), Take 2 tablets by mouth daily., Disp: , Rfl:  .  Multiple Vitamins-Minerals (HAIR/SKIN/NAILS/BIOTIN PO), Take 1 capsule by mouth 3 (three) times daily., Disp: , Rfl:  .  NON FORMULARY, , Disp: , Rfl:  .  Omega 3-6-9 Fatty Acids (OMEGA 3-6-9 COMPLEX) CAPS, Take by mouth., Disp: , Rfl:  .  omeprazole (PRILOSEC) 40 MG capsule, Take 1 capsule (40 mg total) by mouth daily., Disp: 30 capsule, Rfl: 5 .  pravastatin (PRAVACHOL) 20 MG tablet, TAKE 1 TABLET BY MOUTH  DAILY, Disp: 90 tablet, Rfl: 3 .  pregabalin (LYRICA) 50 MG capsule, Take 100 mg by mouth daily., Disp: , Rfl:  .  promethazine (PHENERGAN) 25 MG tablet, TAKE 1 TO 2 TABLETS BY MOUTH TWICE DAILY BEFORE MEALS AS NEEDED, Disp: 60 tablet, Rfl: 0 .  tiZANidine (ZANAFLEX) 4 MG tablet, Take 1 tablet (4 mg total) by mouth every 8 (eight) hours as needed for muscle spasms., Disp: 270 tablet, Rfl: 3 .  Valerian Root 500 MG CAPS, Take 1 capsule by mouth daily. , Disp: , Rfl:  .  zaleplon (SONATA) 10 MG capsule, TAKE 1 CAPSULE BY MOUTH EVERY DAY AT BEDTIME AS NEEDED FOR SLEEP, Disp: 30 capsule, Rfl: 5  Physical exam:  Vitals:   06/15/18 1436  BP: 135/78  Pulse: 81  Resp: 18  Temp: 98.4 F (36.9 C)  TempSrc: Tympanic  Weight: 161 lb (73 kg)   Physical Exam  Constitutional:      Appearance: She is ill-appearing.  HENT:     Head: Normocephalic and atraumatic.     Right Ear: Tympanic membrane and ear  canal normal.     Left Ear: Tympanic membrane and ear canal normal.     Nose: Congestion and rhinorrhea present.     Mouth/Throat:     Mouth: Mucous membranes are moist.     Pharynx: No oropharyngeal exudate.  Eyes:     General: No scleral icterus.    Conjunctiva/sclera: Conjunctivae normal.  Neck:     Musculoskeletal: Normal range of motion and neck supple.  Cardiovascular:     Rate and Rhythm: Normal rate and regular rhythm.     Heart sounds: Normal heart sounds.  Pulmonary:     Effort: Pulmonary effort is normal. No respiratory distress.     Breath sounds: Normal breath sounds. No wheezing.  Skin:    General: Skin is warm and dry.  Neurological:     Mental Status: She is alert and oriented to person, place, and time.  Psychiatric:        Mood and Affect: Mood is anxious.      CMP Latest Ref Rng & Units 05/26/2018  Glucose 70 - 99 mg/dL 157(H)  BUN 8 - 23 mg/dL 25(H)  Creatinine 0.44 - 1.00 mg/dL 1.06(H)  Sodium 135 - 145 mmol/L 138  Potassium 3.5 - 5.1 mmol/L 4.8  Chloride 98 - 111 mmol/L 103  CO2 22 - 32 mmol/L 25  Calcium 8.9 - 10.3 mg/dL 8.9  Total Protein 6.5 - 8.1 g/dL 7.6  Total Bilirubin 0.3 - 1.2 mg/dL 0.3  Alkaline Phos 38 - 126 U/L 97  AST 15 - 41 U/L 38  ALT 0 - 44 U/L 42   CBC Latest Ref Rng & Units 06/15/2018  WBC 4.0 - 10.5 K/uL 6.3  Hemoglobin 12.0 - 15.0 g/dL 11.6(L)  Hematocrit 36.0 - 46.0 % 34.7(L)  Platelets 150 - 400 K/uL 104(L)    No images are attached to the encounter.  No results found.  Assessment and plan- Patient is a 62 y.o. female diagnosed with multiple myeloma, currently in remission, who presents to symptom management clinic for cough and fever.   1.  Myeloma-IgA lambda s/p autologous stem cell transplant 06/2015, currently on surveillance (off Revlimid d/t intolerance). Per Dr. Rogue Bussing, 05/2018 M protein with no evidence of myeloma but lambda light chain slowly rising with concern for recurrence.  Plan to repeat bone marrow  biopsy in the future.  Continue to monitor  2. URI- Labs today overall reassuring. Afebrile in clinic. Wbc 6.3. continue levaquin. Start tessalon for cough. Supportive care discussed including OTC medications.  Start Tessalon for cough.  Follow-up with Dr. Rogue Bussing as scheduled on 06/16/18.   Visit Diagnosis 1. Multiple myeloma in remission ([place])   2. Upper respiratory tract infection, unspecified type     Patient expressed understanding and was in agreement with this plan. She also understands that She can call clinic at any time with any questions, concerns, or complaints.   Thank you for allowing me to participate in the care of this very pleasant patient.   Beckey Rutter, DNP, AGNP-C [place] at [facility] [phone] (work cell) [phone] (office)

## 2018-06-15 NOTE — Telephone Encounter (Signed)
I called patient to check on her per request of Dr Grayland Ormond from weekend call for fever. She reports she is not feeling any better, her chest burns when she coughs, she is still having fever of 99 - 100, she feels weak, Please advise of any new orders, she was started on Levaquin  yesterday

## 2018-06-16 ENCOUNTER — Inpatient Hospital Stay: Payer: Medicare Other

## 2018-06-16 ENCOUNTER — Other Ambulatory Visit: Payer: Self-pay | Admitting: *Deleted

## 2018-06-16 ENCOUNTER — Inpatient Hospital Stay (HOSPITAL_BASED_OUTPATIENT_CLINIC_OR_DEPARTMENT_OTHER): Payer: Medicare Other | Admitting: Internal Medicine

## 2018-06-16 VITALS — BP 96/56 | HR 67 | Temp 98.7°F | Resp 16 | Wt 159.6 lb

## 2018-06-16 VITALS — BP 109/62 | HR 74 | Temp 96.3°F | Resp 16

## 2018-06-16 DIAGNOSIS — E1122 Type 2 diabetes mellitus with diabetic chronic kidney disease: Secondary | ICD-10-CM

## 2018-06-16 DIAGNOSIS — K5900 Constipation, unspecified: Secondary | ICD-10-CM

## 2018-06-16 DIAGNOSIS — K219 Gastro-esophageal reflux disease without esophagitis: Secondary | ICD-10-CM | POA: Diagnosis not present

## 2018-06-16 DIAGNOSIS — G8929 Other chronic pain: Secondary | ICD-10-CM | POA: Diagnosis not present

## 2018-06-16 DIAGNOSIS — J069 Acute upper respiratory infection, unspecified: Secondary | ICD-10-CM

## 2018-06-16 DIAGNOSIS — K529 Noninfective gastroenteritis and colitis, unspecified: Secondary | ICD-10-CM

## 2018-06-16 DIAGNOSIS — Z833 Family history of diabetes mellitus: Secondary | ICD-10-CM

## 2018-06-16 DIAGNOSIS — Z7982 Long term (current) use of aspirin: Secondary | ICD-10-CM

## 2018-06-16 DIAGNOSIS — N183 Chronic kidney disease, stage 3 (moderate): Secondary | ICD-10-CM | POA: Diagnosis not present

## 2018-06-16 DIAGNOSIS — C9001 Multiple myeloma in remission: Secondary | ICD-10-CM

## 2018-06-16 DIAGNOSIS — Z7984 Long term (current) use of oral hypoglycemic drugs: Secondary | ICD-10-CM | POA: Diagnosis not present

## 2018-06-16 DIAGNOSIS — Z8249 Family history of ischemic heart disease and other diseases of the circulatory system: Secondary | ICD-10-CM

## 2018-06-16 DIAGNOSIS — I130 Hypertensive heart and chronic kidney disease with heart failure and stage 1 through stage 4 chronic kidney disease, or unspecified chronic kidney disease: Secondary | ICD-10-CM | POA: Diagnosis not present

## 2018-06-16 DIAGNOSIS — Z87891 Personal history of nicotine dependence: Secondary | ICD-10-CM

## 2018-06-16 DIAGNOSIS — R5383 Other fatigue: Secondary | ICD-10-CM

## 2018-06-16 DIAGNOSIS — Z79899 Other long term (current) drug therapy: Secondary | ICD-10-CM

## 2018-06-16 DIAGNOSIS — R05 Cough: Secondary | ICD-10-CM | POA: Diagnosis not present

## 2018-06-16 LAB — MAGNESIUM: Magnesium: 1.1 mg/dL — ABNORMAL LOW (ref 1.7–2.4)

## 2018-06-16 MED ORDER — SODIUM CHLORIDE 0.9 % IV SOLN
Freq: Once | INTRAVENOUS | Status: AC
  Administered 2018-06-16: 11:00:00 via INTRAVENOUS
  Filled 2018-06-16: qty 250

## 2018-06-16 MED ORDER — MAGNESIUM SULFATE 4 GM/100ML IV SOLN
4.0000 g | Freq: Once | INTRAVENOUS | Status: AC
  Administered 2018-06-16: 4 g via INTRAVENOUS
  Filled 2018-06-16: qty 100

## 2018-06-16 NOTE — Patient Instructions (Signed)
Hypomagnesemia  Hypomagnesemia is a condition in which the level of magnesium in the blood is low. Magnesium is a mineral that is found in many foods. It is used in many different processes in the body. Hypomagnesemia can affect every organ in the body. It can cause life-threatening problems.  What are the causes?  Causes of hypomagnesemia include:   Not getting enough magnesium in your diet.   Malnutrition.   Problems with absorbing magnesium from the intestines.   Dehydration.   Alcohol abuse.   Vomiting.   Severe diarrhea.   Some medicines, including medicines that make you urinate more.   Certain diseases, such as kidney disease, diabetes, and overactive thyroid.    What are the signs or symptoms?   Involuntary shaking or trembling of a body part (tremor).   Confusion.   Muscle weakness.   Sensitivity to light, sound, and touch.   Psychiatric issues, such as depression, irritability, or psychosis.   Sudden tightening of muscles (muscle spasms).   Tingling in the arms and legs.   A feeling of fluttering of the heart.  These symptoms are more severe if magnesium levels drop suddenly.  How is this diagnosed?  To make a diagnosis, your health care provider will do a physical exam and order blood and urine tests.  How is this treated?  Treatment will depend on the cause and the severity of your condition. It may involve:   A magnesium supplement. This can be taken in pill form. It can also be given through an IV tube. This is usually done if the condition is severe.   Changes to your diet. You may be directed to eat foods that have a lot of magnesium, such as green leafy vegetables, peas, beans, and nuts.   Eliminating alcohol from your diet.    Follow these instructions at home:   Include foods with magnesium in your diet. Foods that are rich in magnesium include green vegetables, beans, nuts and seeds, and whole grains.   Take medicines only as directed by your health care provider.   Take  magnesium supplements if your health care provider instructs you to do that. Take them as directed.   Have your magnesium levels monitored as directed by your health care provider.   When you are active, drink fluids that contain electrolytes.   Keep all follow-up visits as directed by your health care provider. This is important.  Contact a health care provider if:   You get worse instead of better.   Your symptoms return.  Get help right away if:   Your symptoms are severe.  This information is not intended to replace advice given to you by your health care provider. Make sure you discuss any questions you have with your health care provider.  Document Released: 03/13/2005 Document Revised: 11/23/2015 Document Reviewed: 01/31/2014  Elsevier Interactive Patient Education  2018 [employer]vier [employer]

## 2018-06-16 NOTE — Progress Notes (Signed)
[place] OFFICE PROGRESS NOTE  Patient Care Team: Glean Hess, MD as PCP - General (Internal Medicine) Cammie Sickle, MD as Consulting Physician (Oncology) Jodell Cipro, MD as Referring Physician (Pain Medicine) Corey Skains, MD as Consulting Physician (Cardiology)  Cancer Staging No matching staging information was found for the patient.   Oncology History   # June 2017- [facility] MULTIPLE MYELOMA [BMBx- 80% plasma cells; [facility]]; Lamda light chain 1340; s/p RVD   # 14th DEC 2016- Auto-Stem cell transplant [[facility];Dr.Hertzig]; rev [dose reduced sec to [place]  # Maintenance Revlimid- 70m 3 w-ON & 1 week OFF; HELD July 2018- sec to ONJ  # Bone lesions-numerous ~530mlytic lesions in Thoracic spine- on Zometa  # 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  # "Colitis" s/p multiple EGD/Colonoscopies; EGD- patchy inflammation/colo- Nov 2018 [Dr.Anna]  DIAGNOSIS: Multiple myeloma  GOALS: Control/palliative  CURRENT/MOST RECENT THERAPY -surveillance      Multiple myeloma in remission ([facility]     INTERVAL HISTORY:  Sarah Lowdermilk89.[id number].  female pleasant patient above history of multiple myeloma currently on surveillance; off maintenance Revlimid because of ONJ is here for follow-up.  Patient complains of recent episode of fever up to 101.4 cough.  Denies any shortness of breath.  Phlegm with coughing.  She is current on Levaquin.  Also on [employer] Not had a chest x-ray.  Chronic fatigue.  Chronic intermittent diarrhea/constipation.  Chronic nausea.  Denies any worsening tingling and numbness.  Review of Systems  Constitutional: Positive for malaise/fatigue. Negative for chills, diaphoresis, fever and weight loss.  HENT: Negative for  nosebleeds and sore throat.   Eyes: Negative for double vision.  Respiratory: Positive for cough. Negative for hemoptysis, sputum production, shortness of breath and wheezing.   Cardiovascular: Negative for chest pain, palpitations, orthopnea and leg swelling.  Gastrointestinal: Positive for constipation and diarrhea. Negative for abdominal pain, blood in stool, heartburn, melena, nausea and vomiting.  Genitourinary: Negative for dysuria, frequency and urgency.  Musculoskeletal: Positive for back pain and joint pain.  Skin: Negative.  Negative for itching and rash.  Neurological: Negative for dizziness, tingling, focal weakness, weakness and headaches.  Endo/Heme/Allergies: Does not bruise/bleed easily.  Psychiatric/Behavioral: Negative for depression. The patient is not nervous/anxious and does not have insomnia.       PAST MEDICAL HISTORY :  Past Medical History:  Diagnosis Date  . Abnormal stress test 02/14/2016   Overview:  Added automatically from request for surgery [id number]  . Anemia   . Anxiety   . Arthritis   . Bicuspid aortic valve   . CHF (congestive heart failure) ([facility]  . CKD (chronic kidney disease) stage 3, GFR 30-59 ml/min (HCC)   . Depression   . Diabetes mellitus ([facility]  . Dizziness   . Fatty liver   . Frequent falls   . GERD (gastroesophageal reflux disease)   . Gout   . Heart murmur   . History of blood transfusion   . History of bone marrow transplant ([facility]  . History of uterine fibroid   . Hypertension   . Hypomagnesemia   . Multiple myeloma ([facility]  . Personal history of chemotherapy   . Renal cyst     PAST SURGICAL HISTORY :  Past Surgical History:  Procedure Laterality Date  . ABDOMINAL HYSTERECTOMY    . 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 CARDIAC ELECTROPHYSIOLOGY MAPPING AND ABLATION    . CARPAL TUNNEL RELEASE Bilateral   . CHOLECYSTECTOMY  2008  . COLONOSCOPY WITH PROPOFOL N/A 05/07/2017   Procedure: COLONOSCOPY WITH PROPOFOL;  Surgeon:  [name], MD;  Location[facility] ENDOSCOPY;  Service: Gastroenterology;  Laterality: N/A;  . ESOPHAGOGASTRODUODENOSCOPY (EGD) WITH PROPOFOL N/A 05/07/2017   Procedure: ESOPHAGOGASTRODUODENOSCOPY (EGD) WITH PROPOFOL;  Surgeon: [name], MD;  Location[facility] END[facility]COPY;  Service: Gastroenterology;  Laterality: N/A;  . FOOT SURGERY Bilateral   . INCONTINENCE SURGERY  2009  . PARTIAL HYSTERECTOMY  03/1996   fibroids  . TONSILLECTOMY  2007    FAMILY HISTORY :   Family History  Problem Relation Age of Onset  . Colon cancer Father   . Renal Disease Father   . Diabetes Mellitus II Father   . Melanoma Paternal Grandmother   . Breast cancer Maternal Aunt 15  . Anemia Mother   . Heart disease Mother   . Heart failure Mother   . Renal Disease Mother   . Congestive Heart Failure Mother   . Heart disease Maternal Uncle   . Throat cancer Maternal Uncle   . Lung cancer Maternal Uncle   . Liver disease Maternal Uncle   . Heart failure Maternal Uncle   . Hearing loss Son 65       Suicide     SOCIAL HISTORY:   Social History   Tobacco Use  . Smoking status: Former Smoker    Packs/day: 1.00    Years: 20.00    Pack years: 20.00    Types: Cigarettes    Last attempt to quit: 07/02/1991    Years since quitting: 26.9  . Smokeless tobacco: Never Used  Substance Use Topics  . Alcohol use: Yes    Alcohol/week: 2.0 standard drinks    Types: 1 Glasses of wine, 1 Cans of beer per week    Comment: occassional  . Drug use: No    ALLERGIES:  is allergic to oxycodone-acetaminophen; codeine; benadryl [diphenhydramine]; morphine; ondansetron; and tylenol [acetaminophen].  MEDICATIONS:  Current Outpatient Medications  Medication Sig Dispense Refill  . allopurinol (ZYLOPRIM) 100 MG tablet TAKE 1 TABLET(100 MG) BY MOUTH DAILY as needed 30 tablet 5  . aspirin 325 MG tablet Take 325 mg by mouth every 4 (four) hours as needed for fever.    Marland Kitchen aspirin 81 MG chewable tablet Chew 1 tablet (81 mg total) by  mouth daily. 30 tablet 0  . benzonatate (TESSALON) 200 MG capsule Take 1 capsule (200 mg total) by mouth 3 (three) times daily as needed for cough. 30 capsule 0  . bisoprolol (ZEBETA) 10 MG tablet TAKE 1 TABLET(10 MG) BY MOUTH DAILY 90 tablet 0  . diphenoxylate-atropine (LOMOTIL) 2.5-0.025 MG tablet Take 2 tablets by mouth daily as needed for diarrhea or loose stools. 45 tablet 2  . DULoxetine (CYMBALTA) 30 MG capsule TAKE 3 CAPSULES BY MOUTH  DAILY 90 capsule 0  . fentaNYL (DURAGESIC - DOSED MCG/HR) 25 MCG/HR patch Place 25 mcg onto the skin every 3 (three) days.    . fexofenadine (ALLEGRA) 180 MG tablet TAKE 1 TABLET(180 MG) BY MOUTH DAILY 30 tablet 5  . fluticasone (FLONASE) 50 MCG/ACT nasal spray Place 2 sprays into both nostrils daily. 16 g 0  . glucose blood (ONE TOUCH ULTRA TEST) test strip     . hydrOXYzine (ATARAX/VISTARIL)  10 MG tablet TAKE 1 TABLET(10 MG) BY MOUTH THREE TIMES DAILY AS NEEDED 90 tablet 0  . levofloxacin (LEVAQUIN) 500 MG tablet Take 1 tablet (500 mg total) by mouth daily. 7 tablet 0  . lisinopril (PRINIVIL,ZESTRIL) 10 MG tablet Take 10 mg by mouth daily.    Marland Kitchen MAGNESIUM CL-CALCIUM CARBONATE PO Take 1 tablet by mouth daily.    . Menaquinone-7 (VITAMIN K2) 100 MCG CAPS Take by mouth.    . metFORMIN (GLUCOPHAGE-XR) 500 MG 24 hr tablet TAKE 1 TABLET BY MOUTH  DAILY WITH BREAKFAST (Patient taking differently: Take 500 mg by mouth every evening. ) 90 tablet 3  . Misc Natural Products (OSTEO BI-FLEX ADV TRIPLE ST PO) Take 2 tablets by mouth daily.    . Multiple Vitamins-Minerals (HAIR/SKIN/NAILS/BIOTIN PO) Take 1 capsule by mouth 3 (three) times daily.    . NON FORMULARY     . Omega 3-6-9 Fatty Acids (OMEGA 3-6-9 COMPLEX) CAPS Take by mouth.    Marland Kitchen omeprazole (PRILOSEC) 40 MG capsule Take 1 capsule (40 mg total) by mouth daily. 30 capsule 5  . pravastatin (PRAVACHOL) 20 MG tablet TAKE 1 TABLET BY MOUTH  DAILY 90 tablet 3  . pregabalin (LYRICA) 50 MG capsule Take 100 mg by mouth  daily.    . promethazine (PHENERGAN) 25 MG tablet TAKE 1 TO 2 TABLETS BY MOUTH TWICE DAILY BEFORE MEALS AS NEEDED 60 tablet 0  . tiZANidine (ZANAFLEX) 4 MG tablet Take 1 tablet (4 mg total) by mouth every 8 (eight) hours as needed for muscle spasms. 270 tablet 3  . Valerian Root 500 MG CAPS Take 1 capsule by mouth daily.     . zaleplon (SONATA) 10 MG capsule TAKE 1 CAPSULE BY MOUTH EVERY DAY AT BEDTIME AS NEEDED FOR SLEEP 30 capsule 5   No current facility-administered medications for this visit.    Facility-Administered Medications Ordered in Other Visits  Medication Dose Route Frequency Provider Last Rate Last Dose  . magnesium sulfate IVPB 4 g 100 mL  4 g Intravenous Once Cammie Sickle, MD 50 mL/hr at 06/16/18 1038 4 g at 06/16/18 1038    PHYSICAL EXAMINATION: ECOG PERFORMANCE STATUS: 1 - Symptomatic but completely ambulatory  BP (!) 96/56 (BP Location: Left Arm, Patient Position: Sitting)   Pulse 67   Temp 98.7 F (37.1 C) (Tympanic)   Resp 16   Wt 159 lb 9.8 oz (72.4 kg)   BMI 29.19 kg/m   Filed Weights   06/16/18 1005  Weight: 159 lb 9.8 oz (72.4 kg)    Physical Exam  Constitutional: She is oriented to person, place, and time and well-developed, well-nourished, and in no distress.  Alone.  HENT:  Head: Normocephalic and atraumatic.  Mouth/Throat: Oropharynx is clear and moist. No oropharyngeal exudate.  Eyes: Pupils are equal, round, and reactive to light.  Neck: Normal range of motion. Neck supple.  Cardiovascular: Normal rate and regular rhythm.  Pulmonary/Chest: Breath sounds normal. No respiratory distress. She has no wheezes.  Abdominal: Soft. Bowel sounds are normal. She exhibits no distension and no mass. There is no abdominal tenderness. There is no rebound and no guarding.  Musculoskeletal: Normal range of motion.        General: No tenderness or edema.  Neurological: She is alert and oriented to person, place, and time.  Skin: Skin is warm.   Psychiatric: Affect normal.       LABORATORY DATA:  I have reviewed the data as listed    Component  Value Date/Time   NA 137 06/15/2018 1417   NA 141 04/19/2016 1604   K 4.6 06/15/2018 1417   CL 105 06/15/2018 1417   CO2 22 06/15/2018 1417   GLUCOSE 119 (H) 06/15/2018 1417   BUN 27 (H) 06/15/2018 1417   BUN 22 04/19/2016 1604   CREATININE 1.30 (H) 06/15/2018 1417   CALCIUM 9.2 06/15/2018 1417   PROT 7.1 06/15/2018 1417   ALBUMIN 4.2 06/15/2018 1417   AST 27 06/15/2018 1417   ALT 36 06/15/2018 1417   ALKPHOS 97 06/15/2018 1417   BILITOT 0.5 06/15/2018 1417   GFRNONAA 44 (L) 06/15/2018 1417   GFRAA 51 (L) 06/15/2018 1417    No results found for: SPEP, UPEP  Lab Results  Component Value Date   WBC 6.3 06/15/2018   NEUTROABS 4.7 06/15/2018   HGB 11.6 (L) 06/15/2018   HCT 34.7 (L) 06/15/2018   MCV 88.3 06/15/2018   PLT 104 (L) 06/15/2018      Chemistry      Component Value Date/Time   NA 137 06/15/2018 1417   NA 141 04/19/2016 1604   K 4.6 06/15/2018 1417   CL 105 06/15/2018 1417   CO2 22 06/15/2018 1417   BUN 27 (H) 06/15/2018 1417   BUN 22 04/19/2016 1604   CREATININE 1.30 (H) 06/15/2018 1417      Component Value Date/Time   CALCIUM 9.2 06/15/2018 1417   ALKPHOS 97 06/15/2018 1417   AST 27 06/15/2018 1417   ALT 36 06/15/2018 1417   BILITOT 0.5 06/15/2018 1417       RADIOGRAPHIC STUDIES: I have personally reviewed the radiological images as listed and agreed with the findings in the report. No results found.   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  Currently on surveillance off Revlimid because of intolerance.  # NOV 2019 M protein no evidence of myeloma; but lamda light chains slowly rising.  Discussed my concerns for recurrence.  Discussed that she will need repeat bone marrow biopsy to confirm recurrence.  Therapy options  include daratumumab plus minus IMID.  # cough- ? URI-currently Levaquin/Tessalon Perles.  If not improved call us for chest x-ray.  # ONJ August 2018-continue to hold bisphosphonate; STABLE.   # Nausea chronic on Phenergan 25 mg prn.  Stable # PN-1-2; sec to chemo/stem cell transplant- on lyrica.  Stable  # Chronic diarrhea/constipation alternating-stable  # Hypomagnesia- [symptomatic- palipi/cramps].  Cont mag 4 gm weekly.  Stable  # Chronic CKD stage III-stable  # Chronic back pain-follows up with pain clinic.  Stable  # DISPOSITION:  # mag today #  every 2 weeks mag level/ infusion;  #  follow up in 2 months; Dr.Corcoran- Trevor Iha panel;k-l light chains; cbc/cmp   No orders of the defined types were placed in this encounter.  All questions were answered. The patient knows to call the clinic with any problems, questions or concerns.      Cammie Sickle, MD 06/16/2018 11:28 AM

## 2018-06-16 NOTE — Assessment & Plan Note (Addendum)
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  Currently on surveillance off Revlimid because of intolerance.  # NOV 2019 M protein no evidence of myeloma; but lamda light chains slowly rising.  Discussed my concerns for recurrence.  Discussed that she will need repeat bone marrow biopsy to confirm recurrence.  Therapy options include daratumumab plus minus IMID.  # cough- ? URI-currently Levaquin/Tessalon Perles.  If not improved call us for chest x-ray.  # ONJ August 2018-continue to hold bisphosphonate; STABLE.   # Nausea chronic on Phenergan 25 mg prn.  Stable # PN-1-2; sec to chemo/stem cell transplant- on lyrica.  Stable  # Chronic diarrhea/constipation alternating-stable  # Hypomagnesia- [symptomatic- palipi/cramps].  Cont mag 4 gm weekly.  Stable  # Chronic CKD stage III-stable  # Chronic back pain-follows up with pain clinic.  Stable  # DISPOSITION:  # mag today #  every 2 weeks mag level/ infusion;  #  follow up in 2 months; Dr.Corcoran- Trevor Iha panel;k-l light chains; cbc/cmp

## 2018-06-26 ENCOUNTER — Inpatient Hospital Stay (HOSPITAL_BASED_OUTPATIENT_CLINIC_OR_DEPARTMENT_OTHER): Payer: Medicare Other | Admitting: Nurse Practitioner

## 2018-06-26 ENCOUNTER — Encounter: Payer: Self-pay | Admitting: Nurse Practitioner

## 2018-06-26 ENCOUNTER — Telehealth: Payer: Self-pay | Admitting: *Deleted

## 2018-06-26 ENCOUNTER — Ambulatory Visit
Admission: RE | Admit: 2018-06-26 | Discharge: 2018-06-26 | Disposition: A | Discharge status: Home / Self Care | Payer: Medicare Other | Attending: Internal Medicine | Admitting: Internal Medicine

## 2018-06-26 ENCOUNTER — Ambulatory Visit
Admission: RE | Admit: 2018-06-26 | Discharge: 2018-06-26 | Disposition: A | Discharge status: Home / Self Care | Payer: Medicare Other | Source: Ambulatory Visit | Attending: Nurse Practitioner | Admitting: Nurse Practitioner

## 2018-06-26 ENCOUNTER — Other Ambulatory Visit: Payer: Self-pay | Admitting: *Deleted

## 2018-06-26 ENCOUNTER — Ambulatory Visit
Admission: RE | Admit: 2018-06-26 | Discharge: 2018-06-26 | Disposition: A | Discharge status: Home / Self Care | Payer: Medicare Other | Source: Ambulatory Visit | Attending: Internal Medicine | Admitting: Internal Medicine

## 2018-06-26 ENCOUNTER — Other Ambulatory Visit: Payer: Self-pay

## 2018-06-26 VITALS — BP 120/80 | HR 67 | Temp 99.0°F | Resp 18 | Ht 62.0 in | Wt 155.0 lb

## 2018-06-26 DIAGNOSIS — J069 Acute upper respiratory infection, unspecified: Secondary | ICD-10-CM | POA: Diagnosis not present

## 2018-06-26 DIAGNOSIS — R0602 Shortness of breath: Secondary | ICD-10-CM

## 2018-06-26 DIAGNOSIS — C9001 Multiple myeloma in remission: Secondary | ICD-10-CM

## 2018-06-26 DIAGNOSIS — R05 Cough: Secondary | ICD-10-CM

## 2018-06-26 DIAGNOSIS — R059 Cough, unspecified: Secondary | ICD-10-CM

## 2018-06-26 DIAGNOSIS — R5383 Other fatigue: Secondary | ICD-10-CM | POA: Diagnosis not present

## 2018-06-26 DIAGNOSIS — K5900 Constipation, unspecified: Secondary | ICD-10-CM | POA: Diagnosis not present

## 2018-06-26 DIAGNOSIS — R079 Chest pain, unspecified: Secondary | ICD-10-CM | POA: Diagnosis not present

## 2018-06-26 LAB — INFLUENZA PANEL BY PCR (TYPE A & B)
Influenza A By PCR: NEGATIVE
Influenza B By PCR: NEGATIVE

## 2018-06-26 MED ORDER — FLUCONAZOLE 150 MG PO TABS: 150.0000 mg | ORAL_TABLET | Freq: Every day | ORAL | 0 refills | Status: DC

## 2018-06-26 MED ORDER — BENZONATATE 200 MG PO CAPS: 200.0000 mg | ORAL_CAPSULE | Freq: Three times a day (TID) | ORAL | 0 refills | Status: DC | PRN

## 2018-06-26 NOTE — Progress Notes (Signed)
Symptom Management [place]  [phone] [phone]  Patient Care Team: Glean Hess, MD as PCP - General (Internal Medicine) Cammie Sickle, MD as Consulting Physician (Oncology) Jodell Cipro, MD as Referring Physician (Pain Medicine) Corey Skains, MD as Consulting Physician (Cardiology)   Name of the patient: Sarah [name]  [id number]  05/14/1956   Date of visit: 06/26/18  Diagnosis-multiple myeloma  Chief complaint/ Reason for visit- URI  Heme/Onc history:  Oncology History   # June 2017- [facility] MULTIPLE MYELOMA [BMBx- 80% plasma cells; [facility]]; [employer] light chain 1340; s/p RVD   # 14th DEC 2016- Auto-Stem cell transplant [[facility];Dr.Hertzig]; rev [dose reduced sec to [place]  # Maintenance Revlimid- 73m 3 w-ON & 1 week OFF; HELD July 2018- sec to ONJ  # Bone lesions-numerous ~585mlytic lesions in Thoracic spine- on Zometa  # 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  # "Colitis" s/p multiple EGD/Colonoscopies; EGD- patchy inflammation/colo- Nov 2018 [Dr.Anna]  DIAGNOSIS: Multiple myeloma  GOALS: Control/palliative  CURRENT/MOST RECENT THERAPY -surveillance      [facility] myel[facility] in remission ([facility]   Interval history- ChAlmina Schul62ear old female with above history of multiple myeloma  S/p autologous stem cell transplant, who presents to symptom management clinic for URI.  She was previously seen in symptom management clinic for similar symptoms.  She was previously started on Levaquin [place]  Today, she reports that her stepfather has passed away and she [place] return trip [place] She states possible exposure to influenza would like to be tested.  States  cough is most bothersome and is persistent.  Fever has resolved.  Has been taking cough medicine with Phenergan and it with some relief-this is prescribed to her sister who shared it with her.  Associated symptoms: Congestion and postnasal drip.  She states her sister ' wanted her to be checked out" [place].  Denies muscle aches or pains.  Denies nausea or vomiting.  Bowel movements regular.  Appetite is good.  ECOG FS:1 - Symptomatic but completely ambulatory  Review of systems- Review of Systems  Constitutional: Negative.   HENT: Positive for congestion. Negative for ear pain, sinus pain and sore throat.   Eyes: Negative.   Respiratory: Positive for cough and sputum production. Negative for hemoptysis, shortness of breath and wheezing.   Cardiovascular: Negative.   Gastrointestinal: Negative.   Genitourinary: Negative.   Musculoskeletal: Negative.   Skin: Negative.   Neurological: Negative.   Endo/Heme/Allergies: Negative.   Psychiatric/Behavioral: Negative.      Current treatment-surveillance  Allergies  Allergen Reactions  . Oxycodone-Acetaminophen Anaphylaxis    Swelling and rash  . Codeine   . Benadryl [Diphenhydramine] Palpitations  . Morphine Itching and Rash  . Ondansetron Diarrhea  . Tylenol [Acetaminophen] Itching and Rash    Past Medical History:  Diagnosis Date  . Abnormal stress test 02/14/2016   Overview:  Added automatically from request for surgery [id number]  . Anemia   . Anxiety   . Arthritis   . Bicuspid aortic valve   . CHF (congestive heart failure) ([facility]  . CKD (chronic kidney disease) stage 3, GFR 30-59 ml/min (HCC)   . Depression   . Diabetes mellitus ([facility]  .  Dizziness   . Fatty liver   . Frequent falls   . GERD (gastroesophageal reflux disease)   . Gout   . Heart murmur   . History of blood transfusion   . History of bone marrow transplant (HCC)   . History of uterine fibroid   . Hypertension   .  Hypomagnesemia   . Multiple myeloma (HCC)   . Personal history of chemotherapy   . Renal cyst     Past Surgical History:  Procedure Laterality Date  . ABDOMINAL HYSTERECTOMY    . 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 CARDIAC ELECTROPHYSIOLOGY MAPPING AND ABLATION    . CARPAL TUNNEL RELEASE Bilateral   . CHOLECYSTECTOMY  2008  . COLONOSCOPY WITH PROPOFOL N/A 05/07/2017   Procedure: COLONOSCOPY WITH PROPOFOL;  Surgeon: Anna, Kiran, MD;  Location: [facility] ENDOSCOPY;  Service: Gastroenterology;  Laterality: N/A;  . ESOPHAGOGASTRODUODENOSCOPY (EGD) WITH PROPOFOL N/A 05/07/2017   Procedure: ESOPHAGOGASTRODUODENOSCOPY (EGD) WITH PROPOFOL;  Surgeon: Anna, Kiran, MD;  Location: [facility] ENDOSCOPY;  Service: Gastroenterology;  Laterality: N/A;  . FOOT SURGERY Bilateral   . INCONTINENCE SURGERY  2009  . PARTIAL HYSTERECTOMY  03/1996   fibroids  . TONSILLECTOMY  2007    Social History   Socioeconomic History  . Marital status: Widowed    Spouse name: Not on file  . Number of children: Not on file  . Years of education: Not on file  . Highest education level: Not on file  Occupational History  . Occupation: Disabled  Social Needs  . Financial resource strain: Not on file  . Food insecurity:    Worry: Not on file    Inability: Not on file  . Transportation needs:    Medical: Not on file    Non-medical: Not on file  Tobacco Use  . Smoking status: Former Smoker    Packs/day: 1.00    Years: 20.00    Pack years: 20.00    Types: Cigarettes    Last attempt to quit: 07/02/1991    Years since quitting: 27.0  . Smokeless tobacco: Never Used  Substance and Sexual Activity  . Alcohol use: Yes    Alcohol/week: 2.0 standard drinks    Types: 1 Glasses of wine, 1 Cans of beer per week    Comment: occassional  . Drug use: No  . Sexual activity: Never  Lifestyle  . Physical activity:    Days per week: Not on file    Minutes per session: Not on file  . Stress: Not on file  Relationships  .  Social connections:    Talks on phone: Not on file    Gets together: Not on file    Attends religious service: Not on file    Active member of club or organization: Not on file    Attends meetings of clubs or organizations: Not on file    Relationship status: Not on file  . Intimate partner violence:    Fear of current or ex partner: Not on file    Emotionally abused: Not on file    Physically abused: Not on file    Forced sexual activity: Not on file  Other Topics Concern  . Not on file  Social History Narrative  . Not on file    Family History  Problem Relation Age of Onset  . Colon cancer Father   . Renal Disease Father   . Diabetes Mellitus II Father   . Melanoma Paternal Grandmother   . Breast cancer Maternal   Aunt 50  . Anemia Mother   . Heart disease Mother   . Heart failure Mother   . Renal Disease Mother   . Congestive Heart Failure Mother   . Heart disease Maternal Uncle   . Throat cancer Maternal Uncle   . Lung cancer Maternal Uncle   . Liver disease Maternal Uncle   . Heart failure Maternal Uncle   . Hearing loss Son 18       Suicide      Current Outpatient Medications:  .  allopurinol (ZYLOPRIM) 100 MG tablet, TAKE 1 TABLET(100 MG) BY MOUTH DAILY as needed, Disp: 30 tablet, Rfl: 5 .  aspirin 325 MG tablet, Take 325 mg by mouth every 4 (four) hours as needed for fever., Disp: , Rfl:  .  aspirin 81 MG chewable tablet, Chew 1 tablet (81 mg total) by mouth daily., Disp: 30 tablet, Rfl: 0 .  benzonatate (TESSALON) 200 MG capsule, Take 1 capsule (200 mg total) by mouth 3 (three) times daily as needed for cough., Disp: 30 capsule, Rfl: 0 .  bisoprolol (ZEBETA) 10 MG tablet, TAKE 1 TABLET(10 MG) BY MOUTH DAILY, Disp: 90 tablet, Rfl: 0 .  diphenoxylate-atropine (LOMOTIL) 2.5-0.025 MG tablet, Take 2 tablets by mouth daily as needed for diarrhea or loose stools., Disp: 45 tablet, Rfl: 2 .  DULoxetine (CYMBALTA) 30 MG capsule, TAKE 3 CAPSULES BY MOUTH  DAILY, Disp: 90  capsule, Rfl: 0 .  fentaNYL (DURAGESIC - DOSED MCG/HR) 25 MCG/HR patch, Place 25 mcg onto the skin every 3 (three) days., Disp: , Rfl:  .  fexofenadine (ALLEGRA) 180 MG tablet, TAKE 1 TABLET(180 MG) BY MOUTH DAILY, Disp: 30 tablet, Rfl: 5 .  fluticasone (FLONASE) 50 MCG/ACT nasal spray, Place 2 sprays into both nostrils daily., Disp: 16 g, Rfl: 0 .  glucose blood (ONE TOUCH ULTRA TEST) test strip, , Disp: , Rfl:  .  hydrOXYzine (ATARAX/VISTARIL) 10 MG tablet, TAKE 1 TABLET(10 MG) BY MOUTH THREE TIMES DAILY AS NEEDED, Disp: 90 tablet, Rfl: 0 .  levofloxacin (LEVAQUIN) 500 MG tablet, Take 1 tablet (500 mg total) by mouth daily., Disp: 7 tablet, Rfl: 0 .  lisinopril (PRINIVIL,ZESTRIL) 10 MG tablet, Take 10 mg by mouth daily., Disp: , Rfl:  .  MAGNESIUM CL-CALCIUM CARBONATE PO, Take 1 tablet by mouth daily., Disp: , Rfl:  .  Menaquinone-7 (VITAMIN K2) 100 MCG CAPS, Take by mouth., Disp: , Rfl:  .  metFORMIN (GLUCOPHAGE-XR) 500 MG 24 hr tablet, TAKE 1 TABLET BY MOUTH  DAILY WITH BREAKFAST (Patient taking differently: Take 500 mg by mouth every evening. ), Disp: 90 tablet, Rfl: 3 .  Misc Natural Products (OSTEO BI-FLEX ADV TRIPLE ST PO), Take 2 tablets by mouth daily., Disp: , Rfl:  .  Multiple Vitamins-Minerals (HAIR/SKIN/NAILS/BIOTIN PO), Take 1 capsule by mouth 3 (three) times daily., Disp: , Rfl:  .  NON FORMULARY, , Disp: , Rfl:  .  Omega 3-6-9 Fatty Acids (OMEGA 3-6-9 COMPLEX) CAPS, Take by mouth., Disp: , Rfl:  .  omeprazole (PRILOSEC) 40 MG capsule, Take 1 capsule (40 mg total) by mouth daily., Disp: 30 capsule, Rfl: 5 .  pravastatin (PRAVACHOL) 20 MG tablet, TAKE 1 TABLET BY MOUTH  DAILY, Disp: 90 tablet, Rfl: 3 .  pregabalin (LYRICA) 50 MG capsule, Take 100 mg by mouth daily., Disp: , Rfl:  .  promethazine (PHENERGAN) 25 MG tablet, TAKE 1 TO 2 TABLETS BY MOUTH TWICE DAILY BEFORE MEALS AS NEEDED, Disp: 60 tablet, Rfl: 0 .    tiZANidine (ZANAFLEX) 4 MG tablet, Take 1 tablet (4 mg total) by mouth  every 8 (eight) hours as needed for muscle spasms., Disp: 270 tablet, Rfl: 3 .  Valerian Root 500 MG CAPS, Take 1 capsule by mouth daily. , Disp: , Rfl:  .  zaleplon (SONATA) 10 MG capsule, TAKE 1 CAPSULE BY MOUTH EVERY DAY AT BEDTIME AS NEEDED FOR SLEEP, Disp: 30 capsule, Rfl: 5  Physical exam:  Vitals:   06/26/18 1245 06/26/18 1253  BP: 120/80   Pulse: 67   Resp: 18   Temp: 99 F (37.2 C)   TempSrc: Oral   SpO2: 99%   Weight:  155 lb (70.3 kg)  Height:  5' 2" (1.575 m)   Physical Exam Constitutional:      General: She is not in acute distress.    Appearance: Normal appearance. She is not ill-appearing.  HENT:     Head: Normocephalic and atraumatic.     Right Ear: Tympanic membrane, ear canal and external ear normal.     Left Ear: Tympanic membrane, ear canal and external ear normal.     Nose: Rhinorrhea present.     Mouth/Throat:     Mouth: Mucous membranes are moist.     Pharynx: Oropharynx is clear.  Eyes:     General: No scleral icterus.    Conjunctiva/sclera: Conjunctivae normal.  Cardiovascular:     Rate and Rhythm: Normal rate and regular rhythm.     Pulses: Normal pulses.     Heart sounds: Normal heart sounds.  Pulmonary:     Effort: Pulmonary effort is normal.     Breath sounds: Normal breath sounds. No wheezing or rhonchi.  Musculoskeletal:        General: No swelling or deformity.  Skin:    General: Skin is warm and dry.  Neurological:     Mental Status: She is alert and oriented to person, place, and time.  Psychiatric:        Mood and Affect: Mood normal.        Behavior: Behavior normal.      CMP Latest Ref Rng & Units 06/15/2018  Glucose 70 - 99 mg/dL 119(H)  BUN 8 - 23 mg/dL 27(H)  Creatinine 0.44 - 1.00 mg/dL 1.30(H)  Sodium 135 - 145 mmol/L 137  Potassium 3.5 - 5.1 mmol/L 4.6  Chloride 98 - 111 mmol/L 105  CO2 22 - 32 mmol/L 22  Calcium 8.9 - 10.3 mg/dL 9.2  Total Protein 6.5 - 8.1 g/dL 7.1  Total Bilirubin 0.3 - 1.2 mg/dL 0.5  Alkaline  Phos 38 - 126 U/L 97  AST 15 - 41 U/L 27  ALT 0 - 44 U/L 36   CBC Latest Ref Rng & Units 06/15/2018  WBC 4.0 - 10.5 K/uL 6.3  Hemoglobin 12.0 - 15.0 g/dL 11.6(L)  Hematocrit 36.0 - 46.0 % 34.7(L)  Platelets 150 - 400 K/uL 104(L)    No images are attached to the encounter.  Dg Chest 2 View  Result Date: 06/26/2018 CLINICAL DATA:  Productive cough, chest pain EXAM: CHEST - 2 VIEW COMPARISON:  07/25/2017 FINDINGS: Heart and mediastinal contours are within normal limits. No focal opacities or effusions. No acute bony abnormality. IMPRESSION: No active cardiopulmonary disease. Electronically Signed   By: Kevin  Dover M.D.   On: 06/26/2018 10:24    Assessment and plan- Patient is a 62 y.o. female with history of multiple myeloma who presents to symptom management clinic for URI.  Chest x-ray today negative.    Flu panel negative.  Again discussed that this is likely viral and antibiotics would not be effective in improving her symptoms.  She is agreeable to this.  We discussed Afrin twice a day for 3 days along with simultaneous Flonase twice a day with the expectation to continue the Flonase after completing the course of Afrin to help open up the paranasal sinuses and improve outflow and help resolve the infection.  We discussed the pathophysiology of how edema from viruses can cause sinus obstruction and how ventilating the sinuses can improve and help overcome the infection.  Low suspicion of secondary bacterial infection today. Continue anti-tussives; tessalon refilled today. Continue mucinex and robitussin dm.   Follow-up with Dr. Brahmanday as scheduled or sooner if symptoms do not improve over the next 1 to 2 weeks.   Visit Diagnosis 1. Upper respiratory tract infection, unspecified type   2. Cough     Patient expressed understanding and was in agreement with this plan. She also understands that She can call clinic at any time with any questions, concerns, or complaints.   Thank you  for allowing me to participate in the care of this very pleasant patient.   Lauren Allen, DNP, AGNP-C [facility] [phone] (work cell) [phone] (office)    

## 2018-06-26 NOTE — Telephone Encounter (Signed)
V/o for cxr and see pt in [place] office today from Ander Purpura, NP. Pt will obtain cxr in [place] this morning. Apt with [facility] at [place] patient with plan of care. She accepted these apts. Pt concerned about shortness of breath and cough. She is expected to go out of town this weekend for a funeral. She requested that the cough/shortness of breath to addressed before she goes on this trip.

## 2018-06-26 NOTE — Telephone Encounter (Signed)
-----   Message from Cephus Richer sent at 06/26/2018  8:17 AM EST ----- Regarding: Xray [phone] Please call pt not feeling good . Would like to have xray done. Per pt MD informed her on her last visit if she wasn't feeling any better to please call us back.

## 2018-06-26 NOTE — Telephone Encounter (Signed)
Per Dr. Sharmaine Base last note, patient will need a chest xray if symptoms persist. Lauren, do you want to see the patient in [facility] clinic this afternoon with a CXR prior?

## 2018-06-26 NOTE — Progress Notes (Signed)
Patient exposed to influenza type A this week. Patient c/o sore throat, congestion and post nasal drip x 10 days. New symptoms of chills and dizziness and intermittent nausea. No vomiting. Patient states that Dr. Grayland Ormond started her on Levaquin last Sat. She has completed this course of antibiotics.  She developed a yeast infection while on the levaquin and was giving 1 dose of diflucan. She plans to go back to [place] for a funeral this weekend and does not want to proceed with going out of town until these symptoms are addressed.

## 2018-06-26 NOTE — Patient Instructions (Signed)
Start Afrin twice a day for 3 days along with simultaneous Flonase twice a day, with the expectation to continue the Flonase after completing the course of Afrin. This should help to open up your sinuses and help resolve the infection. I have sent a prescription for second dose of diflucan for vaginal yeast infection for you to start tomorrow if needed and have refilled tessalon for cough. Please continue robitussin and mucinex. Stay well hydrated to help thin your secretions. Please notify me if you've had no improvement in your symptoms, or symptoms become worse, in the next week. It was a pleasure seeing you today and thank you for allowing me to participate in your care. -Beckey Rutter, NP

## 2018-06-29 ENCOUNTER — Ambulatory Visit: Payer: Medicare Other | Admitting: Urology

## 2018-07-02 ENCOUNTER — Inpatient Hospital Stay: Payer: Medicare Other

## 2018-07-04 ENCOUNTER — Other Ambulatory Visit: Payer: Self-pay

## 2018-07-04 ENCOUNTER — Encounter: Payer: Self-pay | Admitting: Gynecology

## 2018-07-04 ENCOUNTER — Ambulatory Visit
Admission: EM | Admit: 2018-07-04 | Discharge: 2018-07-04 | Disposition: A | Discharge status: Home / Self Care | Payer: Medicare Other | Attending: Emergency Medicine | Admitting: Emergency Medicine

## 2018-07-04 DIAGNOSIS — J069 Acute upper respiratory infection, unspecified: Secondary | ICD-10-CM

## 2018-07-04 DIAGNOSIS — R0982 Postnasal drip: Secondary | ICD-10-CM | POA: Diagnosis not present

## 2018-07-04 DIAGNOSIS — R05 Cough: Secondary | ICD-10-CM | POA: Diagnosis not present

## 2018-07-04 DIAGNOSIS — R0981 Nasal congestion: Secondary | ICD-10-CM | POA: Diagnosis not present

## 2018-07-04 MED ORDER — PROMETHAZINE-DM 6.25-15 MG/5ML PO SYRP: 5.0000 mL | ORAL_SOLUTION | Freq: Four times a day (QID) | ORAL | 0 refills | Status: DC | PRN

## 2018-07-04 NOTE — ED Triage Notes (Signed)
Per patient with uri x 1 month. Pt. Stated seen by her doctor and given antibiotic. Per patient travel to [place] and came x 3 days and again with symptoms

## 2018-07-04 NOTE — ED Provider Notes (Signed)
[facility]    CSN: [id number] Arrival date & time: 07/04/18  1128     History   Chief Complaint Chief Complaint  Patient presents with  . URI    HPI Sarah [name] is a 63 y.o. female.   HPI  She 12-year-old female presents with a URI that she has had for over a month.  She has a history of multiple myeloma status post autologous stem cell transplantation.  Her oncologist placed her on Levaquin for URI and prior to her leaving for [place].  He returned home only to be notified that her stepfather had died and she had a return to [place].  When she came back is ago she had the same symptoms.  On numerous medications due to her multiple myeloma and a stem cell transplant.  Has been running a fever usually worse at nighttime.  However today she is afebrile pulse rate of 70 respirations 16 and O2 sats 99% on room air.  Urine problem at the present time is the cough and just feeling poorly.  Other appointment at the cancer center on Thursday.  Primary care physician is Dr. Halina Maidens        Past Medical History:  Diagnosis Date  . Abnormal stress test 02/14/2016   Overview:  Added automatically from request for surgery [id number]  . Anemia   . Anxiety   . Arthritis   . Bicuspid aortic valve   . CHF (congestive heart failure) ([place])   . CKD (chronic kidney disease) stage 3, GFR 30-59 ml/min (HCC)   . Depression   . Diabetes mellitus ([place])   . Dizziness   . Fatty liver   . Frequent falls   . GERD (gastroesophageal reflux disease)   . Gout   . Heart murmur   . History of blood transfusion   . History of bone marrow transplant ([place])   . History of uterine fibroid   . Hypertension   . Hypomagnesemia   . Multiple myeloma ([place])   . Personal history of chemotherapy   . Renal cyst     Patient Active Problem List   Diagnosis Date Noted  . Thrombocytopenia ([place]) 02/09/2018  . Benign essential HTN 08/21/2017  . Bilateral carotid artery stenosis  08/21/2017  . Cardiomyopathy, idiopathic ([place]) 08/21/2017  . Carotid artery stenosis, asymptomatic, bilateral 08/06/2017  . Thyroid nodule 08/06/2017  . Cardiac syncope 07/25/2017  . Iron deficiency anemia due to chronic blood loss 05/13/2017  . Spinal stenosis of lumbar region with neurogenic claudication 04/09/2017  . Magnesium deficiency 04/08/2017  . 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 LBBB (left bundle branch block) 10/10/2016  . Long term prescription opiate use 09/07/2016  . Chronic pain syndrome 07/08/2016  . Pain medication agreement signed 07/08/2016  . Chronic diastolic CHF (congestive heart failure), NYHA class 2 ([place]) 06/05/2016  . Hx of cardiac catheterization 06/05/2016  . LVH (left ventricular hypertrophy) due to hypertensive disease, with heart failure ([place]) 06/05/2016  . Mild aortic valve stenosis 06/05/2016  . 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 Sinus congestion 05/30/2016  . Environmental and seasonal allergies 04/19/2016  . Insomnia 04/19/2016  . Bicuspid aortic valve 04/19/2016  . Asthma 04/19/2016  . Aortic regurgitation 04/19/2016  . History of CHF (congestive heart failure) 04/19/2016  . DM (diabetes mellitus), type 2 with renal complications ([place]) 20/35/5974  . GERD (gastroesophageal reflux disease) 04/12/2016  . Depression with anxiety 04/12/2016  . Irritable bowel syndrome (IBS) 04/12/2016  .  Hepatic steatosis 04/12/2016  . Multiple myeloma in remission ([place]) 04/02/2016  . 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[place]) 07/06/2015  . Colitis 02/14/2013  . Disequilibrium 01/14/2013    Past Surgical History:  Procedure Laterality Date  . ABDOMINAL HYSTERECTOMY    . 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 CARDIAC ELECTROPHYSIOLOGY MAPPING AND ABLATION    . CARPAL TUNNEL RELEASE Bilateral   . CHOLECYSTECTOMY  2008  . COLONOSCOPY WITH PROPOFOL N/A 05/07/2017   Procedure: COLONOSCOPY WITH PROPOFOL;  Surgeon: [name], MD;  Location: [facility] ENDOSCOPY;  Service: Gastroenterology;  Laterality: N/A;  . ESOPHAGOGASTRODUODENOSCOPY (EGD) WITH PROPOFOL N/A 05/07/2017   Procedure: ESOPHAGOGASTRODUODENOSCOPY (EGD) WITH PROPOFOL;  Surgeon: [name], MD;  Location: [facility] ENDOSCOPY;  Service: Gastroenterology;  Laterality: N/A;  . FOOT SURGERY Bilateral   . INCONTINENCE SURGERY  2009  . PARTIAL HYSTERECTOMY  03/1996   fibroids  . TONSILLECTOMY  2007    OB History   No obstetric history on file.      Home Medications    Prior to Admission medications   Medication Sig Start Date End Date Taking? Authorizing Provider  allopurinol (ZYLOPRIM) 100 MG tablet TAKE 1 TABLET(100 MG) BY MOUTH DAILY as needed 07/26/17  Yes Gladstone Lighter, MD  aspirin 81 MG chewable tablet Chew 1 tablet (81 mg total) by mouth daily. 07/27/17  Yes Gladstone Lighter, MD  benzonatate (TESSALON) 200 MG capsule Take 1 capsule (200 mg total) by mouth 3 (three) times daily as needed for cough. 06/26/18  Yes Verlon Au, NP  bisoprolol (ZEBETA) 10 MG tablet TAKE 1 TABLET(10 MG) BY MOUTH DAILY 04/16/18  Yes Glean Hess, MD  diphenoxylate-atropine (LOMOTIL) 2.5-0.025 MG tablet Take 2 tablets by mouth daily as needed for diarrhea or loose stools. 04/14/18  Yes Cammie Sickle, MD  DULoxetine (CYMBALTA) 30 MG capsule TAKE 3 CAPSULES BY MOUTH  DAILY 05/19/18  Yes Glean Hess, MD  fentaNYL (DURAGESIC - DOSED MCG/HR) 25 MCG/HR patch Place 25 mcg onto the skin every 3 (three) days. 08/12/16  Yes [provider]  fexofenadine (ALLEGRA) 180 MG tablet TAKE 1 TABLET(180 MG) BY MOUTH DAILY 05/30/17  Yes Glean Hess, MD  fluconazole (DIFLUCAN) 150 MG tablet Take 1 tablet (150 mg total) by mouth daily. 06/27/18  Yes Verlon Au, NP  glucose blood (ONE TOUCH ULTRA TEST) test strip  09/04/15  Yes [provider]  hydrOXYzine (ATARAX/VISTARIL) 10 MG tablet TAKE 1 TABLET(10 MG) BY MOUTH THREE TIMES DAILY AS NEEDED  05/30/17  Yes Glean Hess, MD  lisinopril (PRINIVIL,ZESTRIL) 10 MG tablet Take 10 mg by mouth daily.   Yes [provider]  MAGNESIUM CL-CALCIUM CARBONATE PO Take 1 tablet by mouth daily.   Yes [provider]  Menaquinone-7 (VITAMIN K2) 100 MCG CAPS Take by mouth.   Yes [provider]  metFORMIN (GLUCOPHAGE-XR) 500 MG 24 hr tablet TAKE 1 TABLET BY MOUTH  DAILY WITH BREAKFAST Patient taking differently: Take 500 mg by mouth every evening.  04/20/18  Yes Glean Hess, MD  Misc Natural Products (OSTEO BI-FLEX ADV TRIPLE ST PO) Take 2 tablets by mouth daily.   Yes [provider]  Multiple Vitamins-Minerals (HAIR/SKIN/NAILS/BIOTIN PO) Take 1 capsule by mouth 3 (three) times daily.   Yes [provider]  NON FORMULARY    Yes [provider]  Omega 3-6-9 Fatty Acids (OMEGA 3-6-9 COMPLEX) CAPS Take by mouth.   Yes [provider]  omeprazole ([place])  40 MG capsule Take 1 capsule (40 mg total) by mouth daily. 12/31/17  Yes Glean Hess, MD  pravastatin (PRAVACHOL) 20 MG tablet TAKE 1 TABLET BY MOUTH  DAILY 04/21/18  Yes Glean Hess, MD  pregabalin (LYRICA) 50 MG capsule Take 100 mg by mouth daily. 11/11/17  Yes [provider]  promethazine (PHENERGAN) 25 MG tablet TAKE 1 TO 2 TABLETS BY MOUTH TWICE DAILY BEFORE MEALS AS NEEDED 06/08/18  Yes Cammie Sickle, MD  tiZANidine (ZANAFLEX) 4 MG tablet Take 1 tablet (4 mg total) by mouth every 8 (eight) hours as needed for muscle spasms. 04/17/18  Yes Glean Hess, MD  zaleplon (SONATA) 10 MG capsule TAKE 1 CAPSULE BY MOUTH EVERY DAY AT BEDTIME AS NEEDED FOR SLEEP 03/16/18  Yes Glean Hess, MD  fluticasone [employer]) 50 MCG/ACT nasal spray Place 2 sprays into both nostrils daily. 06/11/18   Glean Hess, MD  promethazine-dextromethorphan (PROMETHAZINE-DM) 6.25-15 MG/5ML syrup Take 5 mLs by mouth 4 (four) times daily as needed for cough. 07/04/18    Lorin Picket, PA-C    Family History Family History  Problem Relation Age of Onset  . Colon cancer Father   . Renal Disease Father   . Diabetes Mellitus II Father   . Melanoma Paternal Grandmother   . Breast cancer Maternal Aunt 67  . Anemia Mother   . Heart disease Mother   . Heart failure Mother   . Renal Disease Mother   . Congestive Heart Failure Mother   . Heart disease Maternal Uncle   . Throat cancer Maternal Uncle   . Lung cancer Maternal Uncle   . Liver disease Maternal Uncle   . Heart failure Maternal Uncle   . Hearing loss Son 86       Suicide     Social History Social History   Tobacco Use  . Smoking status: Former Smoker    Packs/day: 1.00    Years: 20.00    Pack years: 20.00    Types: Cigarettes    Last attempt to quit: 07/02/1991    Years since quitting: 27.0  . Smokeless tobacco: Never Used  Substance Use Topics  . Alcohol use: Yes    Alcohol/week: 2.0 standard drinks    Types: 1 Glasses of wine, 1 Cans of beer per week    Comment: occassional  . Drug use: No     Allergies   Oxycodone-acetaminophen; Codeine; Benadryl [diphenhydramine]; Morphine; Ondansetron; and Tylenol [acetaminophen]   Review of Systems Review of Systems  Constitutional: Positive for activity change. Negative for appetite change, chills, fatigue and fever.  HENT: Positive for congestion and postnasal drip.   Respiratory: Positive for cough.   All other systems reviewed and are negative.    Physical Exam Triage Vital Signs ED Triage Vitals  Enc Vitals Group     BP 07/04/18 1220 123/70     Pulse Rate 07/04/18 1220 70     Resp 07/04/18 1220 16     Temp 07/04/18 1220 98.8 F (37.1 C)     Temp Source 07/04/18 1220 Oral     SpO2 07/04/18 1220 99 %     Weight 07/04/18 1221 155 lb (70.3 kg)     Height --      Head Circumference --      Peak Flow --      Pain Score 07/04/18 1221 7     Pain Loc --      Pain Edu? --  Excl. in GC? --    No data  found.  Updated Vital Signs BP 123/70 (BP Location: Left Arm)   Pulse 70   Temp 98.8 F (37.1 C) (Oral)   Resp 16   Wt 155 lb (70.3 kg)   SpO2 99%   BMI 28.35 kg/m   Visual Acuity Right Eye Distance:   Left Eye Distance:   Bilateral Distance:    Right Eye Near:   Left Eye Near:    Bilateral Near:     Physical Exam Vitals signs and nursing note reviewed.  Constitutional:      General: She is not in acute distress.    Appearance: Normal appearance. She is normal weight. She is not ill-appearing, toxic-appearing or diaphoretic.  HENT:     Head: Normocephalic.     Right Ear: Tympanic membrane, ear canal and external ear normal.     Left Ear: Tympanic membrane, ear canal and external ear normal.     Nose: Congestion present.     Mouth/Throat:     Mouth: Mucous membranes are moist.     Pharynx: No oropharyngeal exudate or posterior oropharyngeal erythema.  Eyes:     General:        Right eye: No discharge.        Left eye: No discharge.     Conjunctiva/sclera: Conjunctivae normal.  Neck:     Musculoskeletal: Normal range of motion and neck supple.  Pulmonary:     Effort: Pulmonary effort is normal.     Breath sounds: Normal breath sounds.  Musculoskeletal: Normal range of motion.  Lymphadenopathy:     Cervical: No cervical adenopathy.  Skin:    General: Skin is warm and dry.  Neurological:     General: No focal deficit present.     Mental Status: She is alert and oriented to person, place, and time.  Psychiatric:        Mood and Affect: Mood normal.        Behavior: Behavior normal.        Thought Content: Thought content normal.        Judgment: Judgment normal.      UC Treatments / Results  Labs (all labs ordered are listed, but only abnormal results are displayed) Labs Reviewed - No data to display  EKG None  Radiology No results found.  Procedures Procedures (including critical care time)  Medications Ordered in UC Medications - No data to  display  Initial Impression / Assessment and Plan / UC Course  I have reviewed the triage vital signs and the nursing notes.  Pertinent labs & imaging results that were available during my care of the patient were reviewed by me and considered in my medical decision making (see chart for details).   I have told the patient this is likely a viral illness.  I am reluctant to start her on antibiotic.  At this point she is agreeable to reasoning.  O we will treat her with cough suppressants Promethazine DM.  Use Flonase nasal spray for 2 to 3 weeks.  She will need to follow-up with her cancer specialist on Thursday.   Final Clinical Impressions(s) / UC Diagnoses   Final diagnoses:  Upper respiratory tract infection, unspecified type   Discharge Instructions   None    ED Prescriptions    Medication Sig Dispense Auth. Provider   promethazine-dextromethorphan (PROMETHAZINE-DM) 6.25-15 MG/5ML syrup Take 5 mLs by mouth 4 (four) times daily as needed for cough. 118 mL  Lorin Picket, PA-C     Controlled Substance Prescriptions [place] Controlled Substance Registry consulted? Not Applicable   Lorin Picket, PA-C 07/04/18 1440

## 2018-07-07 ENCOUNTER — Inpatient Hospital Stay: Payer: Medicare Other

## 2018-07-09 ENCOUNTER — Inpatient Hospital Stay: Payer: Medicare Other

## 2018-07-09 ENCOUNTER — Other Ambulatory Visit: Payer: Self-pay | Admitting: Internal Medicine

## 2018-07-09 ENCOUNTER — Inpatient Hospital Stay: Payer: Medicare Other | Attending: Internal Medicine

## 2018-07-09 VITALS — BP 138/70 | HR 70 | Temp 96.7°F | Resp 18

## 2018-07-09 DIAGNOSIS — C9001 Multiple myeloma in remission: Secondary | ICD-10-CM

## 2018-07-09 LAB — MAGNESIUM: Magnesium: 1 mg/dL — ABNORMAL LOW (ref 1.7–2.4)

## 2018-07-09 MED ORDER — MAGNESIUM SULFATE 4 GM/100ML IV SOLN
4.0000 g | Freq: Once | INTRAVENOUS | Status: AC
  Administered 2018-07-09: 4 g via INTRAVENOUS
  Filled 2018-07-09: qty 100

## 2018-07-09 MED ORDER — SODIUM CHLORIDE 0.9 % IV SOLN
Freq: Once | INTRAVENOUS | Status: AC
  Administered 2018-07-09: 09:00:00 via INTRAVENOUS
  Filled 2018-07-09: qty 250

## 2018-07-10 ENCOUNTER — Other Ambulatory Visit: Payer: Self-pay

## 2018-07-10 DIAGNOSIS — R0981 Nasal congestion: Secondary | ICD-10-CM

## 2018-07-10 MED ORDER — FLUTICASONE PROPIONATE 50 MCG/ACT NA SUSP: 2.0000 | Freq: Every day | NASAL | 2 refills | Status: DC

## 2018-07-13 ENCOUNTER — Encounter: Payer: Self-pay | Admitting: Urology

## 2018-07-13 ENCOUNTER — Ambulatory Visit (INDEPENDENT_AMBULATORY_CARE_PROVIDER_SITE_OTHER): Payer: Medicare Other | Admitting: Urology

## 2018-07-13 VITALS — BP 121/74 | HR 73 | Ht 62.0 in | Wt 155.0 lb

## 2018-07-13 DIAGNOSIS — R32 Unspecified urinary incontinence: Secondary | ICD-10-CM | POA: Diagnosis not present

## 2018-07-13 DIAGNOSIS — N3946 Mixed incontinence: Secondary | ICD-10-CM

## 2018-07-13 LAB — URINALYSIS, COMPLETE
Bilirubin, UA: NEGATIVE
Glucose, UA: NEGATIVE
KETONES UA: NEGATIVE
Leukocytes, UA: NEGATIVE
NITRITE UA: NEGATIVE
RBC, UA: NEGATIVE
Specific Gravity, UA: 1.025 (ref 1.005–1.030)
Urobilinogen, Ur: 0.2 mg/dL (ref 0.2–1.0)
pH, UA: 5.5 (ref 5.0–7.5)

## 2018-07-13 LAB — MICROSCOPIC EXAMINATION: RBC, UA: NONE SEEN /hpf (ref 0–2)

## 2018-07-13 NOTE — Progress Notes (Signed)
07/13/2018 8:56 AM   [name]   September 17, 1955 [id number]  Referring provider: Glean Hess, MD [address] [place], [place] [address]  Chief Complaint  Patient presents with  . Urinary Incontinence    HPI: I was consulted to assess the patient is urinary incontinence worsening the last year.  She describes a sling in 2009.  She leaks with coughing sneezing at times and might with heavy lifting.  She leaks more when she is active.  She can sit and feel a bubble or small leak.  Sometimes she has urge incontinence.  She has mild bedwetting.  She wears 2 or 3 pads a day depending on activity level and they are damp  She voids every 4 or 5 hours and gets up twice a night to urinate.  She can have an urge and then cannot go well and will strain.  She said the flow is slow and thick.  She will stop and start.  She will stand up and sedated again.  She does not feel empty.  She had a TIA last year.  She has had a hysterectomy.  She gets treatments and injections and perhaps radiofrequency treatment on her lower back but is no vaginal neurologic symptoms.  She is on oral hypoglycemics.  She failed Myrbetriq prior to her sling.  She can have very loose bowel movements and watery and at times constipation  Modifying factors: There are no other modifying factors  Associated signs and symptoms: There are no other associated signs and symptoms Aggravating and relieving factors: There are no other aggravating or relieving factors Severity: Moderate Duration: Persistent   PMH: Past Medical History:  Diagnosis Date  . Abnormal stress test 02/14/2016   Overview:  Added automatically from request for surgery [id number]  . Anemia   . Anxiety   . Arthritis   . Bicuspid aortic valve   . CHF (congestive heart failure) ([place])   . CKD (chronic kidney disease) stage 3, GFR 30-59 ml/min (HCC)   . Depression   . Diabetes mellitus ([place])   . Dizziness   . Fatty liver   . Frequent  falls   . GERD (gastroesophageal reflux disease)   . Gout   . Heart murmur   . History of blood transfusion   . History of bone marrow transplant ([place])   . History of uterine fibroid   . Hypertension   . Hypomagnesemia   . Multiple myeloma ([place])   . Personal history of chemotherapy   . Renal cyst     Surgical History: Past Surgical History:  Procedure Laterality Date  . ABDOMINAL HYSTERECTOMY    . 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 CARDIAC ELECTROPHYSIOLOGY MAPPING AND ABLATION    . CARPAL TUNNEL RELEASE Bilateral   . CHOLECYSTECTOMY  2008  . COLONOSCOPY WITH PROPOFOL N/A 05/07/2017   Procedure: COLONOSCOPY WITH PROPOFOL;  Surgeon: [name], MD;  Location: [facility] ENDOSCOPY;  Service: Gastroenterology;  Laterality: N/A;  . ESOPHAGOGASTRODUODENOSCOPY (EGD) WITH PROPOFOL N/A 05/07/2017   Procedure: ESOPHAGOGASTRODUODENOSCOPY (EGD) WITH PROPOFOL;  Surgeon: [name], MD;  Location: [facility] ENDOSCOPY;  Service: Gastroenterology;  Laterality: N/A;  . FOOT SURGERY Bilateral   . INCONTINENCE SURGERY  2009  . PARTIAL HYSTERECTOMY  03/1996   fibroids  . TONSILLECTOMY  2007    Home Medications:  Allergies as of 07/13/2018      Reactions   Oxycodone-acetaminophen Anaphylaxis   Swelling and rash   Codeine    Benadryl [diphenhydramine]  Palpitations   Morphine Itching, Rash   Ondansetron Diarrhea   Tylenol [acetaminophen] Itching, Rash      Medication List       Accurate as of July 13, 2018  8:56 AM. Always use your most recent med list.        allopurinol 100 MG tablet Commonly known as:  ZYLOPRIM TAKE 1 TABLET(100 MG) BY MOUTH DAILY as needed   aspirin 81 MG chewable tablet Chew 1 tablet (81 mg total) by mouth daily.   benzonatate 200 MG capsule Commonly known as:  TESSALON Take 1 capsule (200 mg total) by mouth 3 (three) times daily as needed for cough.   bisoprolol 10 MG tablet Commonly known as:  ZEBETA TAKE 1 TABLET(10 MG) BY MOUTH DAILY     diphenoxylate-atropine 2.5-0.025 MG tablet Commonly known as:  LOMOTIL Take 2 tablets by mouth daily as needed for diarrhea or loose stools.   DULoxetine 30 MG capsule Commonly known as:  CYMBALTA TAKE 3 CAPSULES BY MOUTH  DAILY   fentaNYL 25 MCG/HR patch Commonly known as:  DURAGESIC - dosed mcg/hr Place 25 mcg onto the skin every 3 (three) days.   fexofenadine 180 MG tablet Commonly known as:  ALLEGRA TAKE 1 TABLET(180 MG) BY MOUTH DAILY   fluconazole 150 MG tablet Commonly known as:  DIFLUCAN Take 1 tablet (150 mg total) by mouth daily.   fluticasone 50 MCG/ACT nasal spray Commonly known as:  FLONASE Place 2 sprays into both nostrils daily.   HAIR/SKIN/NAILS/BIOTIN PO Take 1 capsule by mouth 3 (three) times daily.   hydrOXYzine 10 MG tablet Commonly known as:  ATARAX/VISTARIL TAKE 1 TABLET(10 MG) BY MOUTH THREE TIMES DAILY AS NEEDED   lisinopril 10 MG tablet Commonly known as:  PRINIVIL,ZESTRIL Take 10 mg by mouth daily.   LYRICA 50 MG capsule Generic drug:  pregabalin Take 100 mg by mouth daily.   MAGNESIUM CL-CALCIUM CARBONATE PO Take 1 tablet by mouth daily.   metFORMIN 500 MG 24 hr tablet Commonly known as:  GLUCOPHAGE-XR TAKE 1 TABLET BY MOUTH  DAILY WITH BREAKFAST   NON FORMULARY   OMEGA 3-6-9 COMPLEX Caps Take by mouth.   omeprazole 40 MG capsule Commonly known as:  PRILOSEC Take 1 capsule (40 mg total) by mouth daily.   ONE TOUCH ULTRA TEST test strip Generic drug:  glucose blood   OSTEO BI-FLEX ADV TRIPLE ST PO Take 2 tablets by mouth daily.   pravastatin 20 MG tablet Commonly known as:  PRAVACHOL TAKE 1 TABLET BY MOUTH  DAILY   promethazine 25 MG tablet Commonly known as:  PHENERGAN TAKE 1 TO 2 TABLETS BY MOUTH TWICE DAILY BEFORE MEALS AS NEEDED   promethazine-dextromethorphan 6.25-15 MG/5ML syrup Commonly known as:  PROMETHAZINE-DM Take 5 mLs by mouth 4 (four) times daily as needed for cough.   tiZANidine 4 MG tablet Commonly  known as:  ZANAFLEX Take 1 tablet (4 mg total) by mouth every 8 (eight) hours as needed for muscle spasms.   Vitamin K2 100 MCG Caps Take by mouth.   zaleplon 10 MG capsule Commonly known as:  SONATA TAKE 1 CAPSULE BY MOUTH EVERY DAY AT BEDTIME AS NEEDED FOR SLEEP       Allergies:  Allergies  Allergen Reactions  . Oxycodone-Acetaminophen Anaphylaxis    Swelling and rash  . Codeine   . Benadryl [Diphenhydramine] Palpitations  . Morphine Itching and Rash  . Ondansetron Diarrhea  . Tylenol [Acetaminophen] Itching and Rash    Family History:  Family History  Problem Relation Age of Onset  . Colon cancer Father   . Renal Disease Father   . Diabetes Mellitus II Father   . Melanoma Paternal Grandmother   . Breast cancer Maternal Aunt 48  . Anemia Mother   . Heart disease Mother   . Heart failure Mother   . Renal Disease Mother   . Congestive Heart Failure Mother   . Heart disease Maternal Uncle   . Throat cancer Maternal Uncle   . Lung cancer Maternal Uncle   . Liver disease Maternal Uncle   . Heart failure Maternal Uncle   . Hearing loss Son 11       Suicide     Social History:  reports that she quit smoking about 27 years ago. Her smoking use included cigarettes. She has a 20.00 pack-year smoking history. She has never used smokeless tobacco. She reports current alcohol use of about 2.0 standard drinks of alcohol per week. She reports that she does not use drugs.  ROS: UROLOGY Frequent Urination?: No Hard to postpone urination?: No Burning/pain with urination?: No Get up at night to urinate?: No Leakage of urine?: Yes Urine stream starts and stops?: No Trouble starting stream?: Yes Do you have to strain to urinate?: No Blood in urine?: No Urinary tract infection?: No Sexually transmitted disease?: No Injury to kidneys or bladder?: No Painful intercourse?: No Weak stream?: Yes Currently pregnant?: No Vaginal bleeding?: No Last menstrual period?:  n  Gastrointestinal Nausea?: Yes Vomiting?: No Indigestion/heartburn?: Yes Diarrhea?: Yes Constipation?: Yes  Constitutional Fever: No Night sweats?: Yes Weight loss?: No Fatigue?: Yes  Skin Skin rash/lesions?: No Itching?: Yes  Eyes Blurred vision?: No Double vision?: No  Ears/Nose/Throat Sore throat?: No Sinus problems?: Yes  Hematologic/Lymphatic Swollen glands?: No Easy bruising?: No  Cardiovascular Leg swelling?: No Chest pain?: No  Respiratory Cough?: No Shortness of breath?: No  Endocrine Excessive thirst?: No  Musculoskeletal Back pain?: Yes Joint pain?: No  Neurological Headaches?: No Dizziness?: Yes  Psychologic Depression?: Yes Anxiety?: Yes  Physical Exam: BP 121/74 (BP Location: Left Arm, Patient Position: Sitting)   Pulse 73   Ht 5' 2"  (1.575 m)   Wt 70.3 kg   BMI 28.35 kg/m   Constitutional:  Alert and oriented, No acute distress. HEENT: [place] AT, moist mucus membranes.  Trachea midline, no masses. Cardiovascular: No clubbing, cyanosis, or edema. Respiratory: Normal respiratory effort, no increased work of breathing. GI: Abdomen is soft, nontender, nondistended, no abdominal masses GU: No CVA tenderness.  Patient had a fixed bladder neck with a shorter urethra.  She had no cystocele or prolapse.  She had some vaginal epithelial folds but I could not feel or see any sling extrusion.  She had no stress incontinence. Skin: No rashes, bruises or suspicious lesions. Lymph: No cervical or inguinal adenopathy. Neurologic: Grossly intact, no focal deficits, moving all 4 extremities. Psychiatric: Normal mood and affect.  Laboratory Data: Lab Results  Component Value Date   WBC 6.3 06/15/2018   HGB 11.6 (L) 06/15/2018   HCT 34.7 (L) 06/15/2018   MCV 88.3 06/15/2018   PLT 104 (L) 06/15/2018    Lab Results  Component Value Date   CREATININE 1.30 (H) 06/15/2018    No results found for: PSA  No results found for:  TESTOSTERONE  Lab Results  Component Value Date   HGBA1C 6.9 (H) 06/11/2018    Urinalysis    Component Value Date/Time   COLORURINE YELLOW (A) 07/26/2017 0031   APPEARANCEUR  CLEAR (A) 07/26/2017 0031   LABSPEC 1.013 07/26/2017 0031   PHURINE 5.0 07/26/2017 0031   GLUCOSEU NEGATIVE 07/26/2017 0031   HGBUR NEGATIVE 07/26/2017 0031   BILIRUBINUR NEGATIVE 07/26/2017 0031   KETONESUR NEGATIVE 07/26/2017 0031   PROTEINUR NEGATIVE 07/26/2017 0031   NITRITE NEGATIVE 07/26/2017 0031   LEUKOCYTESUR TRACE (A) 07/26/2017 0031    Pertinent Imaging:   Assessment & Plan: The patient has mixed incontinence.  She has bedwetting.  She has flow symptoms.  Pathophysiology discussed and the role of urodynamics discussed.  Her flow symptoms were a little bit concerning and they will be evaluated as well during the urodynamics.  She does have a shorter urethra and a fixed bladder neck.  If her stress incontinence was treated in the future a urethral injectable would likely be most ideal.  Her anatomy and her symptoms with a repeat sling could certainly threaten her flow symptoms further and efficacy would need to be lowered based upon the fixed urethra.  1. Urinary incontinence, unspecified type  - Urinalysis, Complete   No follow-ups on file.  Reece Packer, MD  [place] [address] [place], [place] [address] [phone]

## 2018-07-16 ENCOUNTER — Inpatient Hospital Stay: Payer: Medicare Other

## 2018-07-23 ENCOUNTER — Inpatient Hospital Stay: Payer: Medicare Other

## 2018-07-23 VITALS — BP 121/77 | HR 66 | Temp 96.1°F | Resp 18

## 2018-07-23 DIAGNOSIS — C9001 Multiple myeloma in remission: Secondary | ICD-10-CM

## 2018-07-23 LAB — MAGNESIUM: Magnesium: 1.1 mg/dL — ABNORMAL LOW (ref 1.7–2.4)

## 2018-07-23 MED ORDER — SODIUM CHLORIDE 0.9 % IV SOLN
Freq: Once | INTRAVENOUS | Status: AC
  Administered 2018-07-23: 09:00:00 via INTRAVENOUS
  Filled 2018-07-23: qty 250

## 2018-07-23 MED ORDER — MAGNESIUM SULFATE 4 GM/100ML IV SOLN
4.0000 g | Freq: Once | INTRAVENOUS | Status: AC
  Administered 2018-07-23: 4 g via INTRAVENOUS
  Filled 2018-07-23: qty 100

## 2018-07-23 NOTE — Patient Instructions (Addendum)
Magnesium Sulfate injection What is this medicine? MAGNESIUM SULFATE (mag NEE zee um SUL fate) is an electrolyte injection commonly used to treat low magnesium levels in your blood. It is also used to prevent or control seizures in women with preeclampsia or eclampsia. This medicine may be used for other purposes; ask your health care provider or pharmacist if you have questions. What should I tell my health care provider before I take this medicine? They need to know if you have any of these conditions: -heart disease -history of irregular heart beat -kidney disease -an unusual or allergic reaction to magnesium sulfate, medicines, foods, dyes, or preservatives -pregnant or trying to get pregnant -breast-feeding How should I use this medicine? This medicine is for infusion into a vein. It is given by a health care professional in a hospital or clinic setting. Talk to your pediatrician regarding the use of this medicine in children. While this drug may be prescribed for selected conditions, precautions do apply. Overdosage: If you think you have taken too much of this medicine contact a poison control center or emergency room at once. NOTE: This medicine is only for you. Do not share this medicine with others. What if I miss a dose? This does not apply. What may interact with this medicine? This medicine may interact with the following medications: -certain medicines for anxiety or sleep -certain medicines for seizures like phenobarbital -digoxin -medicines that relax muscles for surgery -narcotic medicines for pain This list may not describe all possible interactions. Give your health care provider a list of all the medicines, herbs, non-prescription drugs, or dietary supplements you use. Also tell them if you smoke, drink alcohol, or use illegal drugs. Some items may interact with your medicine. What should I watch for while using this medicine? Your condition will be monitored carefully  while you are receiving this medicine. You may need blood work done while you are receiving this medicine. What side effects may I notice from receiving this medicine? Side effects that you should report to your doctor or health care professional as soon as possible: -allergic reactions like skin rash, itching or hives, swelling of the face, lips, or tongue -facial flushing -muscle weakness -signs and symptoms of low blood pressure like dizziness; feeling faint or lightheaded, falls; unusually weak or tired -signs and symptoms of a dangerous change in heartbeat or heart rhythm like chest pain; dizziness; fast or irregular heartbeat; palpitations; breathing problems -sweating This list may not describe all possible side effects. Call your doctor for medical advice about side effects. You may report side effects to FDA at 1-800-FDA-1088. Where should I keep my medicine? This drug is given in a hospital or clinic and will not be stored at home. NOTE: This sheet is a summary. It may not cover all possible information. If you have questions about this medicine, talk to your doctor, pharmacist, or health care provider.  2019 [employer]vier/[employer] Standard (2016-01-03 12:31:42)   

## 2018-07-30 ENCOUNTER — Inpatient Hospital Stay: Payer: Medicare Other

## 2018-07-31 ENCOUNTER — Other Ambulatory Visit: Payer: Self-pay

## 2018-07-31 DIAGNOSIS — C9001 Multiple myeloma in remission: Secondary | ICD-10-CM

## 2018-08-06 ENCOUNTER — Inpatient Hospital Stay: Payer: Medicare Other | Attending: Internal Medicine

## 2018-08-06 VITALS — BP 124/70 | HR 67 | Temp 96.0°F | Resp 18

## 2018-08-06 DIAGNOSIS — I130 Hypertensive heart and chronic kidney disease with heart failure and stage 1 through stage 4 chronic kidney disease, or unspecified chronic kidney disease: Secondary | ICD-10-CM | POA: Insufficient documentation

## 2018-08-06 DIAGNOSIS — G620 Drug-induced polyneuropathy: Secondary | ICD-10-CM | POA: Insufficient documentation

## 2018-08-06 DIAGNOSIS — Z7982 Long term (current) use of aspirin: Secondary | ICD-10-CM | POA: Insufficient documentation

## 2018-08-06 DIAGNOSIS — E1122 Type 2 diabetes mellitus with diabetic chronic kidney disease: Secondary | ICD-10-CM | POA: Insufficient documentation

## 2018-08-06 DIAGNOSIS — K219 Gastro-esophageal reflux disease without esophagitis: Secondary | ICD-10-CM | POA: Insufficient documentation

## 2018-08-06 DIAGNOSIS — Z79899 Other long term (current) drug therapy: Secondary | ICD-10-CM | POA: Insufficient documentation

## 2018-08-06 DIAGNOSIS — C9001 Multiple myeloma in remission: Secondary | ICD-10-CM | POA: Diagnosis not present

## 2018-08-06 DIAGNOSIS — Z833 Family history of diabetes mellitus: Secondary | ICD-10-CM | POA: Insufficient documentation

## 2018-08-06 DIAGNOSIS — Z9481 Bone marrow transplant status: Secondary | ICD-10-CM | POA: Insufficient documentation

## 2018-08-06 DIAGNOSIS — Z7984 Long term (current) use of oral hypoglycemic drugs: Secondary | ICD-10-CM | POA: Insufficient documentation

## 2018-08-06 DIAGNOSIS — R110 Nausea: Secondary | ICD-10-CM | POA: Insufficient documentation

## 2018-08-06 DIAGNOSIS — M549 Dorsalgia, unspecified: Secondary | ICD-10-CM | POA: Insufficient documentation

## 2018-08-06 DIAGNOSIS — G8929 Other chronic pain: Secondary | ICD-10-CM | POA: Insufficient documentation

## 2018-08-06 DIAGNOSIS — N183 Chronic kidney disease, stage 3 (moderate): Secondary | ICD-10-CM | POA: Insufficient documentation

## 2018-08-06 DIAGNOSIS — Z8249 Family history of ischemic heart disease and other diseases of the circulatory system: Secondary | ICD-10-CM | POA: Insufficient documentation

## 2018-08-06 DIAGNOSIS — Z87891 Personal history of nicotine dependence: Secondary | ICD-10-CM | POA: Insufficient documentation

## 2018-08-06 DIAGNOSIS — Z9484 Stem cells transplant status: Secondary | ICD-10-CM | POA: Insufficient documentation

## 2018-08-06 DIAGNOSIS — D631 Anemia in chronic kidney disease: Secondary | ICD-10-CM | POA: Insufficient documentation

## 2018-08-06 LAB — COMPREHENSIVE METABOLIC PANEL
ALK PHOS: 89 U/L (ref 38–126)
ALT: 40 U/L (ref 0–44)
AST: 33 U/L (ref 15–41)
Albumin: 4.1 g/dL (ref 3.5–5.0)
Anion gap: 11 (ref 5–15)
BUN: 30 mg/dL — ABNORMAL HIGH (ref 8–23)
CALCIUM: 8.8 mg/dL — AB (ref 8.9–10.3)
CO2: 22 mmol/L (ref 22–32)
Chloride: 104 mmol/L (ref 98–111)
Creatinine, Ser: 1.06 mg/dL — ABNORMAL HIGH (ref 0.44–1.00)
GFR calc Af Amer: >60 mL/min (ref >60)
GFR calc non Af Amer: 57 mL/min — ABNORMAL LOW (ref >60)
Glucose, Bld: 140 mg/dL — ABNORMAL HIGH (ref 70–99)
Potassium: 4.6 mmol/L (ref 3.5–5.1)
Sodium: 137 mmol/L (ref 135–145)
Total Bilirubin: 0.4 mg/dL (ref 0.3–1.2)
Total Protein: 7.1 g/dL (ref 6.5–8.1)

## 2018-08-06 LAB — CBC WITH DIFFERENTIAL/PLATELET
Abs Immature Granulocytes: 0.02 10*3/uL (ref 0.00–0.07)
BASOS ABS: 0 10*3/uL (ref 0.0–0.1)
Basophils Relative: 0 %
Eosinophils Absolute: 0.2 10*3/uL (ref 0.0–0.5)
Eosinophils Relative: 3 %
HCT: 31 % — ABNORMAL LOW (ref 36.0–46.0)
Hemoglobin: 10.5 g/dL — ABNORMAL LOW (ref 12.0–15.0)
IMMATURE GRANULOCYTES: 0 %
Lymphocytes Relative: 27 %
Lymphs Abs: 1.3 10*3/uL (ref 0.7–4.0)
MCH: 30.3 pg (ref 26.0–34.0)
MCHC: 33.9 g/dL (ref 30.0–36.0)
MCV: 89.3 fL (ref 80.0–100.0)
Monocytes Absolute: 0.4 10*3/uL (ref 0.1–1.0)
Monocytes Relative: 9 %
NEUTROS PCT: 61 %
Neutro Abs: 2.9 10*3/uL (ref 1.7–7.7)
Platelets: 122 10*3/uL — ABNORMAL LOW (ref 150–400)
RBC: 3.47 MIL/uL — ABNORMAL LOW (ref 3.87–5.11)
RDW: 13.8 % (ref 11.5–15.5)
WBC: 4.8 10*3/uL (ref 4.0–10.5)
nRBC: 0 % (ref 0.0–0.2)

## 2018-08-06 LAB — MAGNESIUM: Magnesium: 1 mg/dL — ABNORMAL LOW (ref 1.7–2.4)

## 2018-08-06 MED ORDER — SODIUM CHLORIDE 0.9 % IV SOLN
Freq: Once | INTRAVENOUS | Status: AC
  Administered 2018-08-06: 10:00:00 via INTRAVENOUS
  Filled 2018-08-06: qty 250

## 2018-08-06 MED ORDER — MAGNESIUM SULFATE 4 GM/100ML IV SOLN
4.0000 g | Freq: Once | INTRAVENOUS | Status: AC
  Administered 2018-08-06: 4 g via INTRAVENOUS
  Filled 2018-08-06: qty 100

## 2018-08-06 NOTE — Patient Instructions (Signed)
Hypomagnesemia  Hypomagnesemia is a condition in which the level of magnesium in the blood is low. Magnesium is a mineral that is found in many foods. It is used in many different processes in the body. Hypomagnesemia can affect every organ in the body. In severe cases, it can cause life-threatening problems.  What are the causes?  This condition may be caused by:   Not getting enough magnesium in your diet.   Malnutrition.   Problems with absorbing magnesium from the intestines.   Dehydration.   Alcohol abuse.   Vomiting.   Severe or chronic diarrhea.   Some medicines, including medicines that make you urinate more (diuretics).   Certain diseases, such as kidney disease, diabetes, celiac disease, and overactive thyroid.  What are the signs or symptoms?  Symptoms of this condition include:   Loss of appetite.   Nausea and vomiting.   Involuntary shaking or trembling of a body part (tremor).   Muscle weakness.   Tingling in the arms and legs.   Sudden tightening of muscles (muscle spasms).   Confusion.   Psychiatric issues, such as depression, irritability, or psychosis.   A feeling of fluttering of the heart.   Seizures.  These symptoms are more severe if magnesium levels drop suddenly.  How is this diagnosed?  This condition may be diagnosed based on:   Your symptoms and medical history.   A physical exam.   Blood and urine tests.  How is this treated?  Treatment depends on the cause and the severity of the condition. It may be treated with:   A magnesium supplement. This can be taken in pill form. If the condition is severe, magnesium is usually given through an IV.   Changes to your diet. You may be directed to eat foods that have a lot of magnesium, such as green leafy vegetables, peas, beans, and nuts.   Stopping any intake of alcohol.  Follow these instructions at home:          Make sure that your diet includes foods with magnesium. Foods that have a lot of magnesium in them  include:  ? Green leafy vegetables, such as spinach and broccoli.  ? Beans and peas.  ? Nuts and seeds, such as almonds and sunflower seeds.  ? Whole grains, such as whole grain bread and fortified cereals.   Take magnesium supplements if your health care provider tells you to do that. Take them as directed.   Take over-the-counter and prescription medicines only as told by your health care provider.   Have your magnesium levels monitored as told by your health care provider.   When you are active, drink fluids that contain electrolytes.   Avoid drinking alcohol.   Keep all follow-up visits as told by your health care provider. This is important.  Contact a health care provider if:   You get worse instead of better.   Your symptoms return.  Get help right away if you:   Develop severe muscle weakness.   Have trouble breathing.   Feel that your heart is racing.  Summary   Hypomagnesemia is a condition in which the level of magnesium in the blood is low.   Hypomagnesemia can affect every organ in the body.   Treatment may include eating more foods that contain magnesium, taking magnesium supplements, and not drinking alcohol.   Have your magnesium levels monitored as told by your health care provider.  This information is not intended to replace advice given   to you by your health care provider. Make sure you discuss any questions you have with your health care provider.  Document Released: 03/13/2005 Document Revised: 05/19/2017 Document Reviewed: 05/19/2017  Elsevier Interactive Patient Education  2019 [employer]

## 2018-08-07 LAB — KAPPA/LAMBDA LIGHT CHAINS
KAPPA, LAMDA LIGHT CHAIN RATIO: 0.17 — AB (ref 0.26–1.65)
Kappa free light chain: 21.4 mg/L — ABNORMAL HIGH (ref 3.3–19.4)
Lambda free light chains: 128.8 mg/L — ABNORMAL HIGH (ref 5.7–26.3)

## 2018-08-10 LAB — MULTIPLE MYELOMA PANEL, SERUM
ALPHA2 GLOB SERPL ELPH-MCNC: 0.9 g/dL (ref 0.4–1.0)
Albumin SerPl Elph-Mcnc: 3.7 g/dL (ref 2.9–4.4)
Albumin/Glob SerPl: 1.4 (ref 0.7–1.7)
Alpha 1: 0.2 g/dL (ref 0.0–0.4)
B-Globulin SerPl Elph-Mcnc: 0.9 g/dL (ref 0.7–1.3)
Gamma Glob SerPl Elph-Mcnc: 0.7 g/dL (ref 0.4–1.8)
Globulin, Total: 2.7 g/dL (ref 2.2–3.9)
IgA: 210 mg/dL (ref 87–352)
IgG (Immunoglobin G), Serum: 729 mg/dL (ref 700–1600)
IgM (Immunoglobulin M), Srm: 17 mg/dL — ABNORMAL LOW (ref 26–217)
Total Protein ELP: 6.4 g/dL (ref 6.0–8.5)

## 2018-08-13 ENCOUNTER — Ambulatory Visit: Payer: Medicare Other | Admitting: Hematology and Oncology

## 2018-08-13 ENCOUNTER — Other Ambulatory Visit: Payer: Medicare Other

## 2018-08-13 ENCOUNTER — Ambulatory Visit: Payer: Medicare Other

## 2018-08-14 ENCOUNTER — Other Ambulatory Visit: Payer: Self-pay

## 2018-08-14 DIAGNOSIS — K219 Gastro-esophageal reflux disease without esophagitis: Secondary | ICD-10-CM | POA: Diagnosis not present

## 2018-08-14 DIAGNOSIS — G8929 Other chronic pain: Secondary | ICD-10-CM | POA: Diagnosis not present

## 2018-08-14 DIAGNOSIS — Z87891 Personal history of nicotine dependence: Secondary | ICD-10-CM | POA: Diagnosis not present

## 2018-08-14 DIAGNOSIS — I130 Hypertensive heart and chronic kidney disease with heart failure and stage 1 through stage 4 chronic kidney disease, or unspecified chronic kidney disease: Secondary | ICD-10-CM | POA: Diagnosis not present

## 2018-08-14 DIAGNOSIS — Z7984 Long term (current) use of oral hypoglycemic drugs: Secondary | ICD-10-CM | POA: Diagnosis not present

## 2018-08-14 DIAGNOSIS — E1122 Type 2 diabetes mellitus with diabetic chronic kidney disease: Secondary | ICD-10-CM | POA: Diagnosis not present

## 2018-08-14 DIAGNOSIS — Z79899 Other long term (current) drug therapy: Secondary | ICD-10-CM | POA: Diagnosis not present

## 2018-08-14 DIAGNOSIS — R110 Nausea: Secondary | ICD-10-CM | POA: Diagnosis not present

## 2018-08-14 DIAGNOSIS — M549 Dorsalgia, unspecified: Secondary | ICD-10-CM | POA: Diagnosis not present

## 2018-08-14 DIAGNOSIS — Z9484 Stem cells transplant status: Secondary | ICD-10-CM | POA: Diagnosis not present

## 2018-08-14 DIAGNOSIS — Z8249 Family history of ischemic heart disease and other diseases of the circulatory system: Secondary | ICD-10-CM | POA: Diagnosis not present

## 2018-08-14 DIAGNOSIS — C9001 Multiple myeloma in remission: Secondary | ICD-10-CM

## 2018-08-14 DIAGNOSIS — G620 Drug-induced polyneuropathy: Secondary | ICD-10-CM | POA: Diagnosis not present

## 2018-08-14 DIAGNOSIS — Z7982 Long term (current) use of aspirin: Secondary | ICD-10-CM | POA: Diagnosis not present

## 2018-08-14 DIAGNOSIS — D631 Anemia in chronic kidney disease: Secondary | ICD-10-CM | POA: Diagnosis not present

## 2018-08-14 DIAGNOSIS — N183 Chronic kidney disease, stage 3 (moderate): Secondary | ICD-10-CM | POA: Diagnosis not present

## 2018-08-14 DIAGNOSIS — Z9481 Bone marrow transplant status: Secondary | ICD-10-CM | POA: Diagnosis not present

## 2018-08-14 DIAGNOSIS — Z833 Family history of diabetes mellitus: Secondary | ICD-10-CM | POA: Diagnosis not present

## 2018-08-14 LAB — PROTEIN, URINE, 24 HOUR
Collection Interval-UPROT: 24 hours
Protein, 24H Urine: 342 mg/d — ABNORMAL HIGH (ref 50–100)
Protein, Urine: 19 mg/dL
Urine Total Volume-UPROT: 1800 mL

## 2018-08-16 ENCOUNTER — Other Ambulatory Visit: Payer: Self-pay | Admitting: Internal Medicine

## 2018-08-18 ENCOUNTER — Other Ambulatory Visit: Payer: Self-pay

## 2018-08-18 DIAGNOSIS — C9001 Multiple myeloma in remission: Secondary | ICD-10-CM

## 2018-08-20 ENCOUNTER — Inpatient Hospital Stay: Payer: Medicare Other

## 2018-08-20 ENCOUNTER — Other Ambulatory Visit: Payer: Self-pay | Admitting: Hematology and Oncology

## 2018-08-20 ENCOUNTER — Other Ambulatory Visit: Payer: Self-pay | Admitting: Internal Medicine

## 2018-08-20 ENCOUNTER — Encounter: Payer: Self-pay | Admitting: Hematology and Oncology

## 2018-08-20 ENCOUNTER — Inpatient Hospital Stay (HOSPITAL_BASED_OUTPATIENT_CLINIC_OR_DEPARTMENT_OTHER): Payer: Medicare Other | Admitting: Hematology and Oncology

## 2018-08-20 ENCOUNTER — Ambulatory Visit: Payer: Medicare Other

## 2018-08-20 VITALS — BP 131/81 | HR 71 | Temp 97.4°F | Resp 18 | Ht 62.0 in | Wt 160.8 lb

## 2018-08-20 DIAGNOSIS — E538 Deficiency of other specified B group vitamins: Secondary | ICD-10-CM | POA: Diagnosis not present

## 2018-08-20 DIAGNOSIS — Z7189 Other specified counseling: Secondary | ICD-10-CM | POA: Insufficient documentation

## 2018-08-20 DIAGNOSIS — G620 Drug-induced polyneuropathy: Secondary | ICD-10-CM | POA: Diagnosis not present

## 2018-08-20 DIAGNOSIS — R7989 Other specified abnormal findings of blood chemistry: Secondary | ICD-10-CM

## 2018-08-20 DIAGNOSIS — D649 Anemia, unspecified: Secondary | ICD-10-CM

## 2018-08-20 DIAGNOSIS — C9001 Multiple myeloma in remission: Secondary | ICD-10-CM

## 2018-08-20 DIAGNOSIS — D500 Iron deficiency anemia secondary to blood loss (chronic): Secondary | ICD-10-CM

## 2018-08-20 DIAGNOSIS — F418 Other specified anxiety disorders: Secondary | ICD-10-CM

## 2018-08-20 DIAGNOSIS — G8929 Other chronic pain: Secondary | ICD-10-CM | POA: Diagnosis not present

## 2018-08-20 DIAGNOSIS — R110 Nausea: Secondary | ICD-10-CM | POA: Diagnosis not present

## 2018-08-20 DIAGNOSIS — M549 Dorsalgia, unspecified: Secondary | ICD-10-CM | POA: Diagnosis not present

## 2018-08-20 LAB — CBC WITH DIFFERENTIAL/PLATELET
Abs Immature Granulocytes: 0.01 10*3/uL (ref 0.00–0.07)
Basophils Absolute: 0 10*3/uL (ref 0.0–0.1)
Basophils Relative: 0 %
Eosinophils Absolute: 0.1 10*3/uL (ref 0.0–0.5)
Eosinophils Relative: 2 %
HCT: 37.1 % (ref 36.0–46.0)
Hemoglobin: 12.5 g/dL (ref 12.0–15.0)
Immature Granulocytes: 0 %
Lymphocytes Relative: 26 %
Lymphs Abs: 1.2 10*3/uL (ref 0.7–4.0)
MCH: 30.1 pg (ref 26.0–34.0)
MCHC: 33.7 g/dL (ref 30.0–36.0)
MCV: 89.4 fL (ref 80.0–100.0)
Monocytes Absolute: 0.4 10*3/uL (ref 0.1–1.0)
Monocytes Relative: 8 %
Neutro Abs: 2.9 10*3/uL (ref 1.7–7.7)
Neutrophils Relative %: 64 %
Platelets: 134 10*3/uL — ABNORMAL LOW (ref 150–400)
RBC: 4.15 MIL/uL (ref 3.87–5.11)
RDW: 13.6 % (ref 11.5–15.5)
WBC: 4.5 10*3/uL (ref 4.0–10.5)
nRBC: 0 % (ref 0.0–0.2)

## 2018-08-20 LAB — RETICULOCYTES
Immature Retic Fract: 12.6 % (ref 2.3–15.9)
RBC.: 4.21 MIL/uL (ref 3.87–5.11)
Retic Count, Absolute: 72.4 10*3/uL (ref 19.0–186.0)
Retic Ct Pct: 1.7 % (ref 0.4–3.1)

## 2018-08-20 LAB — IRON AND TIBC
Iron: 68 ug/dL (ref 28–170)
Saturation Ratios: 19 % (ref 10.4–31.8)
TIBC: 367 ug/dL (ref 250–450)
UIBC: 299 ug/dL

## 2018-08-20 LAB — FOLATE: Folate: 21.6 ng/mL (ref >5.9)

## 2018-08-20 LAB — BASIC METABOLIC PANEL
Anion gap: 9 (ref 5–15)
BUN: 33 mg/dL — ABNORMAL HIGH (ref 8–23)
CO2: 22 mmol/L (ref 22–32)
Calcium: 9.1 mg/dL (ref 8.9–10.3)
Chloride: 109 mmol/L (ref 98–111)
Creatinine, Ser: 1.23 mg/dL — ABNORMAL HIGH (ref 0.44–1.00)
GFR calc Af Amer: 55 mL/min — ABNORMAL LOW (ref >60)
GFR calc non Af Amer: 47 mL/min — ABNORMAL LOW (ref >60)
Glucose, Bld: 157 mg/dL — ABNORMAL HIGH (ref 70–99)
Potassium: 4.2 mmol/L (ref 3.5–5.1)
Sodium: 140 mmol/L (ref 135–145)

## 2018-08-20 LAB — VITAMIN B12: Vitamin B-12: 295 pg/mL (ref 180–914)

## 2018-08-20 LAB — MAGNESIUM: Magnesium: 1.2 mg/dL — ABNORMAL LOW (ref 1.7–2.4)

## 2018-08-20 LAB — FERRITIN: Ferritin: 101 ng/mL (ref 11–307)

## 2018-08-20 LAB — TSH: TSH: 2.105 u[IU]/mL (ref 0.350–4.500)

## 2018-08-20 MED ORDER — MAG GLYCINATE 100 MG PO TABS: 100.0000 mg | ORAL_TABLET | Freq: Two times a day (BID) | ORAL | 0 refills | Status: DC

## 2018-08-20 MED ORDER — SODIUM CHLORIDE 0.9 % IV SOLN
Freq: Once | INTRAVENOUS | Status: AC
  Administered 2018-08-20: 10:00:00 via INTRAVENOUS
  Filled 2018-08-20: qty 250

## 2018-08-20 MED ORDER — MAGNESIUM SULFATE 4 GM/100ML IV SOLN
4.0000 g | Freq: Once | INTRAVENOUS | Status: AC
  Administered 2018-08-20: 4 g via INTRAVENOUS
  Filled 2018-08-20: qty 100

## 2018-08-20 NOTE — Patient Instructions (Signed)
Magnesium Sulfate injection What is this medicine? MAGNESIUM SULFATE (mag NEE zee um SUL fate) is an electrolyte injection commonly used to treat low magnesium levels in your blood. It is also used to prevent or control seizures in women with preeclampsia or eclampsia. This medicine may be used for other purposes; ask your health care provider or pharmacist if you have questions. What should I tell my health care provider before I take this medicine? They need to know if you have any of these conditions: -heart disease -history of irregular heart beat -kidney disease -an unusual or allergic reaction to magnesium sulfate, medicines, foods, dyes, or preservatives -pregnant or trying to get pregnant -breast-feeding How should I use this medicine? This medicine is for infusion into a vein. It is given by a health care professional in a hospital or clinic setting. Talk to your pediatrician regarding the use of this medicine in children. While this drug may be prescribed for selected conditions, precautions do apply. Overdosage: If you think you have taken too much of this medicine contact a poison control center or emergency room at once. NOTE: This medicine is only for you. Do not share this medicine with others. What if I miss a dose? This does not apply. What may interact with this medicine? This medicine may interact with the following medications: -certain medicines for anxiety or sleep -certain medicines for seizures like phenobarbital -digoxin -medicines that relax muscles for surgery -narcotic medicines for pain This list may not describe all possible interactions. Give your health care provider a list of all the medicines, herbs, non-prescription drugs, or dietary supplements you use. Also tell them if you smoke, drink alcohol, or use illegal drugs. Some items may interact with your medicine. What should I watch for while using this medicine? Your condition will be monitored carefully  while you are receiving this medicine. You may need blood work done while you are receiving this medicine. What side effects may I notice from receiving this medicine? Side effects that you should report to your doctor or health care professional as soon as possible: -allergic reactions like skin rash, itching or hives, swelling of the face, lips, or tongue -facial flushing -muscle weakness -signs and symptoms of low blood pressure like dizziness; feeling faint or lightheaded, falls; unusually weak or tired -signs and symptoms of a dangerous change in heartbeat or heart rhythm like chest pain; dizziness; fast or irregular heartbeat; palpitations; breathing problems -sweating This list may not describe all possible side effects. Call your doctor for medical advice about side effects. You may report side effects to FDA at 1-800-FDA-1088. Where should I keep my medicine? This drug is given in a hospital or clinic and will not be stored at home. NOTE: This sheet is a summary. It may not cover all possible information. If you have questions about this medicine, talk to your doctor, pharmacist, or health care provider.  2019 [employer] Standard (2016-01-03 12:31:42)   

## 2018-08-20 NOTE — Progress Notes (Signed)
[facility]     [address]     [place], [place] [address]     Phone: [phone]      Fax: [phone]       Clinic day:  08/20/2018  Chief Complaint: Sarah [name] is a 63 y.o. 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  HPI:   The patient was diagnosed with multiple myeloma in 11/2015.  She had proteinuria (3 gm/day).  Lambda light chains were 1340.  Bone marrow revealed 80% plasma cells.  She was seen by Dr. Franklyn Lor at the [facility].  She received RVD chemotherapy.  She uderwent autologous stem cell transplant at the [facility] (Dr. Dellis Filbert) on 06/14/2015.  She received maintenance Revlimid (5 mg 3 weeks on/1 week off).  Revlimid was reduced secondary to diarrhea and eventually discontinued on 01/21/2017.  She was noted to have numerous 5 mm lytic lesions in the thoracic spine.  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  The patient was last seen in the medical oncology clinic by Dr. Rogue Bussing on 06/16/2018.  At that time, she had a recent fever (101.4) and a cough with phlegm.  She was on Levaquin.  CBC revealed a hematocrit of 34.7, hemoglobin 11.6, MCV 88.3, platelets 104,000, white count 6300 with an ANC 4700.  BUN was 27 with a creatinine of 1.3.  Protein was 7.1 with an albumin of 4.2.  Calcium was 9.2.  Magnesium was 1.1.  She received IV magnesium.  Bone survey on 05/28/2017 revealed no definite lytic lesion seen in the visualized skeleton.  M-spike has been followed:  0 on 04/02/2016 - 08/06/2018 ; and 0.2 on 09/02/2017.  Lambda light chains have been followed: 22.2 (ratio 0.56) on 07/03/2017, 30.8 (ratio 0.78) on 09/02/2017, 36.9 (ratio 0.40) on 10/21/2017, 37.4 (ratio 0.41) on 12/16/2017, 70.7(ratio 0.31)  on 02/17/2018, 64.2 (ratio 0.27) on 04/07/2018, 78.9 (ratio 0.18)  on 05/26/2018, and 128.8 (ratio 0.17) on 08/06/2018.  B12 was 254 on 04/09/2017.  TSH was 1.655 on 09/24/2016.  Symptomatically, she presents with complaints of chronic pain in her back, which is managed at [facility]. She has pain in her neck that "radiate up into her head causing a headache". Patient has had chronic nausea, especially after eating, for over 10 years. She has undergone an extensive workup that proved to be unrevealing.  Patient is followed by gastroenterology. Patient denies bleeding; no hematochezia, melena, or gross hematuria.   She has issues with bladder leakage. She is followed by urology. She has undergone a bladder sling procedure in the past. She notes that she was thought to have a tumor on her adrenal gland, which is in the process of being evaluated.   Patient advises that she maintains an adequate appetite. She is eating well. Weight today is 160 lb 13.2 oz (73 kg), which compared to her last visit to the clinic, represents a 5 pound increase.   Patient complains of pain, rated 5/10, in the clinic today.  She has a history of colitis and is s/p multiple EGDs and colonoscopies.  Colonoscopy on 05/07/2017 by Dr. Vicente Males was normal.  EGD on 05/07/2017 revealed gastritis and a normal esophagus and duodenum.    Past Medical History:  Diagnosis Date  . Abnormal stress test 02/14/2016   Overview:  Added automatically from request for surgery [id number]  .  Anemia   . Anxiety   . Arthritis   . Bicuspid aortic valve   . CHF (congestive heart failure) ([place])   . CKD (chronic kidney disease) stage 3, GFR 30-59 ml/min (HCC)   . Depression   . Diabetes mellitus ([place])   . Dizziness   . Fatty liver   . Frequent falls   . GERD (gastroesophageal reflux disease)   . Gout   . Heart murmur   . History of blood transfusion   . History of bone marrow transplant ([place])   . History of uterine fibroid   . Hypertension   . Hypomagnesemia   . Multiple myeloma ([place])   . Personal history of  chemotherapy   . Renal cyst     Past Surgical History:  Procedure Laterality Date  . ABDOMINAL HYSTERECTOMY    . 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 CARDIAC ELECTROPHYSIOLOGY MAPPING AND ABLATION    . CARPAL TUNNEL RELEASE Bilateral   . CHOLECYSTECTOMY  2008  . COLONOSCOPY WITH PROPOFOL N/A 05/07/2017   Procedure: COLONOSCOPY WITH PROPOFOL;  Surgeon: [name], MD;  Location: [facility] ENDOSCOPY;  Service: Gastroenterology;  Laterality: N/A;  . ESOPHAGOGASTRODUODENOSCOPY (EGD) WITH PROPOFOL N/A 05/07/2017   Procedure: ESOPHAGOGASTRODUODENOSCOPY (EGD) WITH PROPOFOL;  Surgeon: [name], MD;  Location: [facility] ENDOSCOPY;  Service: Gastroenterology;  Laterality: N/A;  . FOOT SURGERY Bilateral   . INCONTINENCE SURGERY  2009  . PARTIAL HYSTERECTOMY  03/1996   fibroids  . TONSILLECTOMY  2007    Family History  Problem Relation Age of Onset  . Colon cancer Father   . Renal Disease Father   . Diabetes Mellitus II Father   . Melanoma Paternal Grandmother   . Breast cancer Maternal Aunt 61  . Anemia Mother   . Heart disease Mother   . Heart failure Mother   . Renal Disease Mother   . Congestive Heart Failure Mother   . Heart disease Maternal Uncle   . Throat cancer Maternal Uncle   . Lung cancer Maternal Uncle   . Liver disease Maternal Uncle   . Heart failure Maternal Uncle   . Hearing loss Son 89       Suicide     Social History:  reports that she quit smoking about 27 years ago. Her smoking use included cigarettes. She has a 20.00 pack-year smoking history. She has never used smokeless tobacco. She reports current alcohol use of about 2.0 standard drinks of alcohol per week. She reports that she does not use drugs.  Patient has not had ETOH in several months. She is on disability. She notes exposure to perchloroethylene [facility]). The patient is alone today.  Allergies:  Allergies  Allergen Reactions  . Oxycodone-Acetaminophen Anaphylaxis    Swelling and rash  . Codeine   .  Benadryl [Diphenhydramine] Palpitations  . Morphine Itching and Rash  . Ondansetron Diarrhea  . Tylenol [Acetaminophen] Itching and Rash    Current Medications: Current Outpatient Medications  Medication Sig Dispense Refill  . allopurinol (ZYLOPRIM) 100 MG tablet TAKE 1 TABLET(100 MG) BY MOUTH DAILY as needed 30 tablet 5  . aspirin 81 MG chewable tablet Chew 1 tablet (81 mg total) by mouth daily. 30 tablet 0  . bisoprolol (ZEBETA) 10 MG tablet TAKE 1 TABLET(10 MG) BY MOUTH DAILY 90 tablet 0  . diphenoxylate-atropine (LOMOTIL) 2.5-0.025 MG tablet Take 2 tablets by mouth daily as needed for diarrhea or loose stools. 45 tablet 2  . DULoxetine (CYMBALTA) 30 MG capsule TAKE  3 CAPSULES BY MOUTH  DAILY 90 capsule 0  . fentaNYL (DURAGESIC - DOSED MCG/HR) 25 MCG/HR patch Place 25 mcg onto the skin every 3 (three) days.     . fexofenadine (ALLEGRA) 180 MG tablet TAKE 1 TABLET(180 MG) BY MOUTH DAILY 30 tablet 5  . lisinopril (PRINIVIL,ZESTRIL) 10 MG tablet Take 10 mg by mouth daily.    Marland Kitchen MAGNESIUM CL-CALCIUM CARBONATE PO Take 1 tablet by mouth daily.    . metFORMIN (GLUCOPHAGE-XR) 500 MG 24 hr tablet TAKE 1 TABLET BY MOUTH  DAILY WITH BREAKFAST (Patient taking differently: Take 500 mg by mouth every evening. ) 90 tablet 3  . omeprazole (PRILOSEC) 40 MG capsule Take 1 capsule (40 mg total) by mouth daily. 30 capsule 5  . pravastatin (PRAVACHOL) 20 MG tablet TAKE 1 TABLET BY MOUTH  DAILY 90 tablet 3  . pregabalin (LYRICA) 50 MG capsule Take 100 mg by mouth daily.    . promethazine (PHENERGAN) 25 MG tablet TAKE 1 TO 2 TABLETS BY MOUTH TWICE DAILY BEFORE MEALS AS NEEDED 60 tablet 0  . promethazine-dextromethorphan (PROMETHAZINE-DM) 6.25-15 MG/5ML syrup Take 5 mLs by mouth 4 (four) times daily as needed for cough. 118 mL 0  . zaleplon (SONATA) 10 MG capsule TAKE 1 CAPSULE BY MOUTH EVERY DAY AT BEDTIME AS NEEDED FOR SLEEP 30 capsule 5  . benzonatate (TESSALON) 200 MG capsule Take 1 capsule (200 mg total) by  mouth 3 (three) times daily as needed for cough. (Patient not taking: Reported on 08/20/2018) 60 capsule 0  . fluconazole (DIFLUCAN) 150 MG tablet Take 1 tablet (150 mg total) by mouth daily. (Patient not taking: Reported on 08/20/2018) 1 tablet 0  . fluticasone (FLONASE) 50 MCG/ACT nasal spray Place 2 sprays into both nostrils daily. (Patient not taking: Reported on 08/20/2018) 16 g 2  . glucose blood (ONE TOUCH ULTRA TEST) test strip     . hydrOXYzine (ATARAX/VISTARIL) 10 MG tablet TAKE 1 TABLET(10 MG) BY MOUTH THREE TIMES DAILY AS NEEDED (Patient not taking: Reported on 08/20/2018) 90 tablet 0  . Menaquinone-7 (VITAMIN K2) 100 MCG CAPS Take by mouth.    . Misc Natural Products (OSTEO BI-FLEX ADV TRIPLE ST PO) Take 2 tablets by mouth daily.    . Multiple Vitamins-Minerals (HAIR/SKIN/NAILS/BIOTIN PO) Take 1 capsule by mouth 3 (three) times daily.    . NON FORMULARY     . Omega 3-6-9 Fatty Acids (OMEGA 3-6-9 COMPLEX) CAPS Take by mouth.    Marland Kitchen tiZANidine (ZANAFLEX) 4 MG tablet Take 1 tablet (4 mg total) by mouth every 8 (eight) hours as needed for muscle spasms. (Patient not taking: Reported on 08/20/2018) 270 tablet 3   No current facility-administered medications for this visit.     Review of Systems:  GENERAL:  Feels "ok".  No fevers or sweats.  Weight up 5 pounds. PERFORMANCE STATUS (ECOG):  1 HEENT:  No visual changes, runny nose, sore throat, mouth sores or tenderness. Lungs: No shortness of breath or cough.  No hemoptysis. Cardiac:  No chest pain, palpitations, orthopnea, or PND. GI:  Nausea, at times.  Intermittent diarrhea and constipation.  No vomiting, melena or hematochezia. h/o colitis.  EGD and colonoscopy 05/07/2017. GU:  Bladder leakage.  No urgency, frequency, dysuria, or hematuria. Musculoskeletal:  Back and neck pain.  No joint pain.  No muscle tenderness. Extremities:  No pain or swelling. Skin:  No rashes or skin changes. Neuro:  No headache, numbness or weakness, balance or  coordination issues. Endocrine:  Diabetes.  Thyroid issues, hot flashes or night sweats. Psych:  No mood changes, depression or anxiety. Pain:  Chronic pain (5 out of 10 lower back pain). Review of systems:  All other systems reviewed and found to be negative.  Physical Exam: Blood pressure 131/81, pulse 71, temperature (!) 97.4 F (36.3 C), temperature source Tympanic, resp. rate 18, height 5' 2"  (1.575 m), weight 160 lb 13.2 oz (73 kg), SpO2 100 %. GENERAL:  Well developed, well nourished, woman sitting comfortably in the exam room in no acute distress. MENTAL STATUS:  Alert and oriented to person, place and time. HEAD:  Normocephalic, atraumatic, face symmetric, no Cushingoid features. EYES:  Glasses.  Pupils equal round and reactive to light and accomodation.  No conjunctivitis or scleral icterus. ENT:  Oropharynx clear without lesion.  Tongue normal.  Upper dentures.  Mucous membranes moist.  RESPIRATORY:  Clear to auscultation without rales, wheezes or rhonchi. CARDIOVASCULAR:  Regular rate and rhythm without murmur, rub or gallop. ABDOMEN:  Soft, non-tender, with active bowel sounds, and no hepatosplenomegaly.  No masses. SKIN:  No rashes, ulcers or lesions. EXTREMITIES: No edema, no skin discoloration or tenderness.  No palpable cords. LYMPH NODES: No palpable cervical, supraclavicular, axillary or inguinal adenopathy  NEUROLOGICAL: Unremarkable. PSYCH:  Appropriate.   Appointment on 08/20/2018  Component Date Value Ref Range Status  . Magnesium 08/20/2018 1.2* 1.7 - 2.4 mg/dL Final   Performed at [facility], [address], [place], [place] [address]  . Sodium 08/20/2018 140  135 - 145 mmol/L Final  . Potassium 08/20/2018 4.2  3.5 - 5.1 mmol/L Final  . Chloride 08/20/2018 109  98 - 111 mmol/L Final  . CO2 08/20/2018 22  22 - 32 mmol/L Final  . Glucose, Bld 08/20/2018 157* 70 - 99 mg/dL Final  . BUN 08/20/2018 33* 8 - 23 mg/dL Final  . Creatinine, Ser  08/20/2018 1.23* 0.44 - 1.00 mg/dL Final  . Calcium 08/20/2018 9.1  8.9 - 10.3 mg/dL Final  . GFR calc non Af Amer 08/20/2018 47* >60 mL/min Final  . GFR calc Af Amer 08/20/2018 55* >60 mL/min Final  . Anion gap 08/20/2018 9  5 - 15 Final   Performed at [facility], [address] [place], [place] [address]  . WBC 08/20/2018 4.5  4.0 - 10.5 K/uL Final  . RBC 08/20/2018 4.15  3.87 - 5.11 MIL/uL Final  . Hemoglobin 08/20/2018 12.5  12.0 - 15.0 g/dL Final  . HCT 08/20/2018 37.1  36.0 - 46.0 % Final  . MCV 08/20/2018 89.4  80.0 - 100.0 fL Final  . MCH 08/20/2018 30.1  26.0 - 34.0 pg Final  . MCHC 08/20/2018 33.7  30.0 - 36.0 g/dL Final  . RDW 08/20/2018 13.6  11.5 - 15.5 % Final  . Platelets 08/20/2018 134* 150 - 400 K/uL Final  . nRBC 08/20/2018 0.0  0.0 - 0.2 % Final  . Neutrophils Relative % 08/20/2018 64  % Final  . Neutro Abs 08/20/2018 2.9  1.7 - 7.7 K/uL Final  . Lymphocytes Relative 08/20/2018 26  % Final  . Lymphs Abs 08/20/2018 1.2  0.7 - 4.0 K/uL Final  . Monocytes Relative 08/20/2018 8  % Final  . Monocytes Absolute 08/20/2018 0.4  0.1 - 1.0 K/uL Final  . Eosinophils Relative 08/20/2018 2  % Final  . Eosinophils Absolute 08/20/2018 0.1  0.0 - 0.5 K/uL Final  . Basophils Relative 08/20/2018 0  % Final  . Basophils Absolute 08/20/2018 0.0  0.0 - 0.1 K/uL Final  . Immature Granulocytes 08/20/2018 0  % Final  . Abs Immature Granulocytes 08/20/2018 0.01  0.00 - 0.07 K/uL Final   Performed at [facility], [address], [place], [place] [address]    Assessment:  Cinzia Devos is a 63 y.o. female with stage III IgA lambda light chain multiple myeloma s/p autologous stem cell transplant in 06/14/2015 at the [facility].  Bone marrow revealed 80% plasma cells.  She initially underwent induction with RVD.  Revlimid maintenance was discontinued on 01/21/2017 secondary to intolerance.    M-spike has been followed:  0 on 04/02/2016 - 08/06/2018  ; and 0.2 on 09/02/2017.  Lambda light chains have been followed: 22.2 (ratio 0.56) on 07/03/2017, 30.8 (ratio 0.78) on 09/02/2017, 36.9 (ratio 0.40) on 10/21/2017, 37.4 (ratio 0.41) on 12/16/2017, 70.7(ratio 0.31)  on 02/17/2018, 64.2 (ratio 0.27) on 04/07/2018, 78.9 (ratio 0.18) on 05/26/2018, and 128.8 (ratio 0.17) on 08/06/2018.  Bone survey on 05/28/2017 revealed no definite lytic lesion seen in the visualized skeleton.  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  Zometa was discontinued in 01/2017.  Symptomatically, she has chronic back pain and nausea.  Exam is unremarkable.  Plan: 1.   Labs today:  CBC with diff, retic, ferritin, iron studies, BMP, B12, folate, TSH.  2.   Stage III multiple myeloma  Patient is s/p autologous stem cell transplant in 06/2015.  She is off maintenance Revlimid secondary to intolerance.  M-spike 0 on 08/06/2018.  Following lambda light light chains (increasing).  Continue close surveillance.  Schedule yearly bone survey. 3.   Normocytic anemia  Hemoglobin 10.5.  MCV 89.3.  Etiology unclear.  Component of anemia of chronic kidney disease.  Anemia work-up today. 4.   Stage III chronic kidney disease  BUN 33.  Creatinine 1.23.  CrCl 48.8 ml/min.  Patient followed by Dr. Holley Raring. 5.   Hypomagnesemia  Magnesium 1.2 today.  Chronic.  She requires frequent IV supplementation.  Magnesium sulfate 4 gm IV today.  Rx:  magnesium glycinate 100 mg BID.  RTC every 2 weeks for labs (Mg) and +/- IV magnesium 6.   Osteonecrosis of the jaw  ONJ occurred in 01/2017 secondary to Zometa.  No further bisphosphonates. 7.   Chronic back pain  Etiology not felt secondary to myeloma.  Patient followed at [facility] in the pain clinic. 8.   Grade I-II peripheral neuropathy  Etiology secondary to prior chemotherapy.  Continue Lyrica. 9.   RTC in 2 months for MD assessment and labs (CBC with diff, CMP, Mg, SPEP, and FLCA).   Honor Loh, NP   08/20/2018, 10:03 AM   I saw and evaluated the patient, participating in the key portions of the service and reviewing pertinent diagnostic studies and records.  I reviewed the nurse practitioner's note and agree with the findings and the plan.  The assessment and plan were discussed with the patient.  Additional diagnostic studies of a bone survey are needed to clarify disease status and would change the clinical management. Multiple questions were asked by the patient and answered.   Nolon Stalls, MD 08/20/2018,10:03 AM

## 2018-08-21 ENCOUNTER — Other Ambulatory Visit: Payer: Self-pay

## 2018-08-21 ENCOUNTER — Telehealth: Payer: Self-pay

## 2018-08-21 DIAGNOSIS — E538 Deficiency of other specified B group vitamins: Secondary | ICD-10-CM

## 2018-08-21 DIAGNOSIS — C9001 Multiple myeloma in remission: Secondary | ICD-10-CM

## 2018-08-21 NOTE — Telephone Encounter (Signed)
Spoke with the patient to inform her of her low b-12 levels . The patient states she takes liquid B-12 1 drop under her tounge daily, but has not been taking it lately. I have also informed her she need to take B-12 oral 1,031mcg daily. Per Dr Mike Gip. Per Dr Mike Gip she will a repeat for B-12 levels in 1 month. The patient was understanding and agreeable to start the oral B-12 1,000 mcg daily.

## 2018-08-21 NOTE — Telephone Encounter (Signed)
Left message on the patient voice mail about her b-12 levels and she will need to contact our office. if she have any question please feel free to contact pour office. I will contact the patient on at a later time.

## 2018-08-21 NOTE — Telephone Encounter (Signed)
-----   Message from Karen Kitchens, NP sent at 08/20/2018  4:08 PM EST ----- Labs reviewed. B12 level found to be low.   Plans: 1. Please call patient and advise her to start oral B12 1000 mcg daily.  2. Recheck B12 level in 1 month.  3. Make patient aware that if B12 level not improving after a month of oral therapy, then we will need to consider parenteral supplementation.   Gaspar Bidding

## 2018-08-21 NOTE — Telephone Encounter (Signed)
-----   Message from Karen Kitchens, NP sent at 08/20/2018  4:08 PM EST ----- Labs reviewed. B12 level found to be low.   Plans: 1. Please call patient and advise her to start oral B12 1000 mcg daily.  2. Recheck B12 level in 1 month.  3. Make patient aware that if B12 level not improving after a month of oral therapy, then we will need to consider parenteral supplementation.   Sarah [name]

## 2018-09-02 ENCOUNTER — Inpatient Hospital Stay: Payer: Medicare Other

## 2018-09-02 ENCOUNTER — Inpatient Hospital Stay: Payer: Medicare Other | Attending: Urgent Care

## 2018-09-02 ENCOUNTER — Other Ambulatory Visit: Payer: Self-pay | Admitting: Urgent Care

## 2018-09-02 VITALS — BP 120/77 | HR 81 | Temp 96.9°F | Resp 18

## 2018-09-02 DIAGNOSIS — C9001 Multiple myeloma in remission: Secondary | ICD-10-CM

## 2018-09-02 LAB — MAGNESIUM: Magnesium: 1.5 mg/dL — ABNORMAL LOW (ref 1.7–2.4)

## 2018-09-02 MED ORDER — MAGNESIUM SULFATE 2 GM/50ML IV SOLN
2.0000 g | Freq: Once | INTRAVENOUS | Status: AC
  Administered 2018-09-02: 2 g via INTRAVENOUS
  Filled 2018-09-02: qty 50

## 2018-09-02 MED ORDER — SODIUM CHLORIDE 0.9 % IV SOLN
Freq: Once | INTRAVENOUS | Status: AC
  Administered 2018-09-02: 14:00:00 via INTRAVENOUS
  Filled 2018-09-02: qty 250

## 2018-09-02 NOTE — Patient Instructions (Signed)
Hypomagnesemia  Hypomagnesemia is a condition in which the level of magnesium in the blood is low. Magnesium is a mineral that is found in many foods. It is used in many different processes in the body. Hypomagnesemia can affect every organ in the body. In severe cases, it can cause life-threatening problems.  What are the causes?  This condition may be caused by:   Not getting enough magnesium in your diet.   Malnutrition.   Problems with absorbing magnesium from the intestines.   Dehydration.   Alcohol abuse.   Vomiting.   Severe or chronic diarrhea.   Some medicines, including medicines that make you urinate more (diuretics).   Certain diseases, such as kidney disease, diabetes, celiac disease, and overactive thyroid.  What are the signs or symptoms?  Symptoms of this condition include:   Loss of appetite.   Nausea and vomiting.   Involuntary shaking or trembling of a body part (tremor).   Muscle weakness.   Tingling in the arms and legs.   Sudden tightening of muscles (muscle spasms).   Confusion.   Psychiatric issues, such as depression, irritability, or psychosis.   A feeling of fluttering of the heart.   Seizures.  These symptoms are more severe if magnesium levels drop suddenly.  How is this diagnosed?  This condition may be diagnosed based on:   Your symptoms and medical history.   A physical exam.   Blood and urine tests.  How is this treated?  Treatment depends on the cause and the severity of the condition. It may be treated with:   A magnesium supplement. This can be taken in pill form. If the condition is severe, magnesium is usually given through an IV.   Changes to your diet. You may be directed to eat foods that have a lot of magnesium, such as green leafy vegetables, peas, beans, and nuts.   Stopping any intake of alcohol.  Follow these instructions at home:          Make sure that your diet includes foods with magnesium. Foods that have a lot of magnesium in them  include:  ? Green leafy vegetables, such as spinach and broccoli.  ? Beans and peas.  ? Nuts and seeds, such as almonds and sunflower seeds.  ? Whole grains, such as whole grain bread and fortified cereals.   Take magnesium supplements if your health care provider tells you to do that. Take them as directed.   Take over-the-counter and prescription medicines only as told by your health care provider.   Have your magnesium levels monitored as told by your health care provider.   When you are active, drink fluids that contain electrolytes.   Avoid drinking alcohol.   Keep all follow-up visits as told by your health care provider. This is important.  Contact a health care provider if:   You get worse instead of better.   Your symptoms return.  Get help right away if you:   Develop severe muscle weakness.   Have trouble breathing.   Feel that your heart is racing.  Summary   Hypomagnesemia is a condition in which the level of magnesium in the blood is low.   Hypomagnesemia can affect every organ in the body.   Treatment may include eating more foods that contain magnesium, taking magnesium supplements, and not drinking alcohol.   Have your magnesium levels monitored as told by your health care provider.  This information is not intended to replace advice given   to you by your health care provider. Make sure you discuss any questions you have with your health care provider.  Document Released: 03/13/2005 Document Revised: 05/19/2017 Document Reviewed: 05/19/2017  Elsevier Interactive Patient Education  2019 [employer]

## 2018-09-03 ENCOUNTER — Inpatient Hospital Stay: Payer: Medicare Other

## 2018-09-10 DIAGNOSIS — D631 Anemia in chronic kidney disease: Secondary | ICD-10-CM | POA: Diagnosis not present

## 2018-09-10 DIAGNOSIS — R809 Proteinuria, unspecified: Secondary | ICD-10-CM | POA: Diagnosis not present

## 2018-09-10 DIAGNOSIS — E1122 Type 2 diabetes mellitus with diabetic chronic kidney disease: Secondary | ICD-10-CM | POA: Diagnosis not present

## 2018-09-10 DIAGNOSIS — I10 Essential (primary) hypertension: Secondary | ICD-10-CM | POA: Diagnosis not present

## 2018-09-10 DIAGNOSIS — N183 Chronic kidney disease, stage 3 (moderate): Secondary | ICD-10-CM | POA: Diagnosis not present

## 2018-09-17 ENCOUNTER — Inpatient Hospital Stay: Payer: Medicare Other

## 2018-09-26 ENCOUNTER — Other Ambulatory Visit: Payer: Self-pay | Admitting: Internal Medicine

## 2018-09-28 ENCOUNTER — Ambulatory Visit: Payer: Medicare Other

## 2018-09-28 ENCOUNTER — Ambulatory Visit: Payer: Medicare Other | Admitting: Urology

## 2018-09-29 NOTE — Telephone Encounter (Signed)
Patient is now under Dr. Kem Parkinson care. I will forward to their team.

## 2018-10-01 ENCOUNTER — Inpatient Hospital Stay: Payer: Medicare Other

## 2018-10-06 DIAGNOSIS — I6523 Occlusion and stenosis of bilateral carotid arteries: Secondary | ICD-10-CM | POA: Diagnosis not present

## 2018-10-06 DIAGNOSIS — I10 Essential (primary) hypertension: Secondary | ICD-10-CM | POA: Diagnosis not present

## 2018-10-06 DIAGNOSIS — Q231 Congenital insufficiency of aortic valve: Secondary | ICD-10-CM | POA: Diagnosis not present

## 2018-10-06 DIAGNOSIS — I495 Sick sinus syndrome: Secondary | ICD-10-CM | POA: Diagnosis not present

## 2018-10-06 DIAGNOSIS — I447 Left bundle-branch block, unspecified: Secondary | ICD-10-CM | POA: Diagnosis not present

## 2018-10-06 DIAGNOSIS — I428 Other cardiomyopathies: Secondary | ICD-10-CM | POA: Diagnosis not present

## 2018-10-08 ENCOUNTER — Telehealth: Payer: Self-pay

## 2018-10-08 ENCOUNTER — Inpatient Hospital Stay: Payer: Medicare Other | Attending: Hematology and Oncology

## 2018-10-08 ENCOUNTER — Other Ambulatory Visit: Payer: Self-pay

## 2018-10-08 DIAGNOSIS — D649 Anemia, unspecified: Secondary | ICD-10-CM | POA: Insufficient documentation

## 2018-10-08 DIAGNOSIS — C9001 Multiple myeloma in remission: Secondary | ICD-10-CM | POA: Insufficient documentation

## 2018-10-08 DIAGNOSIS — N183 Chronic kidney disease, stage 3 (moderate): Secondary | ICD-10-CM | POA: Insufficient documentation

## 2018-10-08 DIAGNOSIS — E538 Deficiency of other specified B group vitamins: Secondary | ICD-10-CM | POA: Diagnosis not present

## 2018-10-08 LAB — CBC WITH DIFFERENTIAL/PLATELET
Abs Immature Granulocytes: 0.01 10*3/uL (ref 0.00–0.07)
Basophils Absolute: 0 10*3/uL (ref 0.0–0.1)
Basophils Relative: 0 %
Eosinophils Absolute: 0.1 10*3/uL (ref 0.0–0.5)
Eosinophils Relative: 2 %
HCT: 37 % (ref 36.0–46.0)
Hemoglobin: 12.4 g/dL (ref 12.0–15.0)
Immature Granulocytes: 0 %
Lymphocytes Relative: 18 %
Lymphs Abs: 1.1 10*3/uL (ref 0.7–4.0)
MCH: 29.4 pg (ref 26.0–34.0)
MCHC: 33.5 g/dL (ref 30.0–36.0)
MCV: 87.7 fL (ref 80.0–100.0)
Monocytes Absolute: 0.5 10*3/uL (ref 0.1–1.0)
Monocytes Relative: 8 %
Neutro Abs: 4.4 10*3/uL (ref 1.7–7.7)
Neutrophils Relative %: 72 %
Platelets: 147 10*3/uL — ABNORMAL LOW (ref 150–400)
RBC: 4.22 MIL/uL (ref 3.87–5.11)
RDW: 13.2 % (ref 11.5–15.5)
WBC: 6.2 10*3/uL (ref 4.0–10.5)
nRBC: 0 % (ref 0.0–0.2)

## 2018-10-08 LAB — COMPREHENSIVE METABOLIC PANEL
ALT: 56 U/L — ABNORMAL HIGH (ref 0–44)
AST: 40 U/L (ref 15–41)
Albumin: 4.6 g/dL (ref 3.5–5.0)
Alkaline Phosphatase: 107 U/L (ref 38–126)
Anion gap: 10 (ref 5–15)
BUN: 41 mg/dL — ABNORMAL HIGH (ref 8–23)
CO2: 21 mmol/L — ABNORMAL LOW (ref 22–32)
Calcium: 9.4 mg/dL (ref 8.9–10.3)
Chloride: 104 mmol/L (ref 98–111)
Creatinine, Ser: 1.66 mg/dL — ABNORMAL HIGH (ref 0.44–1.00)
GFR calc Af Amer: 38 mL/min — ABNORMAL LOW (ref >60)
GFR calc non Af Amer: 33 mL/min — ABNORMAL LOW (ref >60)
Glucose, Bld: 179 mg/dL — ABNORMAL HIGH (ref 70–99)
Potassium: 4.7 mmol/L (ref 3.5–5.1)
Sodium: 135 mmol/L (ref 135–145)
Total Bilirubin: 0.3 mg/dL (ref 0.3–1.2)
Total Protein: 8.1 g/dL (ref 6.5–8.1)

## 2018-10-08 LAB — MAGNESIUM: Magnesium: 1.5 mg/dL — ABNORMAL LOW (ref 1.7–2.4)

## 2018-10-08 LAB — VITAMIN B12: Vitamin B-12: 391 pg/mL (ref 180–914)

## 2018-10-08 NOTE — Telephone Encounter (Signed)
I have contacted the patient she states she is taking 400 mg 1 tab po daily and mag chewable 165 mg  x3 every night and she does report she was having diarrhea last week but it has currently resolved. The patient refused  IV mag at this time. The patient was understanding due to her mag levels and just don't want IV mag. The patient was also agreeable to all schedule appointment.

## 2018-10-08 NOTE — Telephone Encounter (Signed)
-----   Message from Lequita Asal, MD sent at 10/08/2018 10:44 AM EDT ----- Regarding: Please call patient  Magnesium 1.5 (low).  Is she taking her oral magnesium?  Any diarrhea?  Does she want to come in for IV magnesium?  M ----- Message ----- From: Buel Ream, [facility] [place] Sent: 10/08/2018  10:04 AM EDT To: Lequita Asal, MD

## 2018-10-09 LAB — KAPPA/LAMBDA LIGHT CHAINS
Kappa free light chain: 23.7 mg/L — ABNORMAL HIGH (ref 3.3–19.4)
Kappa, lambda light chain ratio: 0.13 — ABNORMAL LOW (ref 0.26–1.65)
Lambda free light chains: 181.5 mg/L — ABNORMAL HIGH (ref 5.7–26.3)

## 2018-10-12 ENCOUNTER — Other Ambulatory Visit: Payer: Self-pay

## 2018-10-12 DIAGNOSIS — D696 Thrombocytopenia, unspecified: Secondary | ICD-10-CM

## 2018-10-12 DIAGNOSIS — C9001 Multiple myeloma in remission: Secondary | ICD-10-CM

## 2018-10-12 LAB — PROTEIN ELECTROPHORESIS, SERUM
A/G Ratio: 1.4 (ref 0.7–1.7)
Albumin ELP: 4.2 g/dL (ref 2.9–4.4)
Alpha-1-Globulin: 0.3 g/dL (ref 0.0–0.4)
Alpha-2-Globulin: 0.9 g/dL (ref 0.4–1.0)
Beta Globulin: 1.1 g/dL (ref 0.7–1.3)
Gamma Globulin: 0.7 g/dL (ref 0.4–1.8)
Globulin, Total: 3 g/dL (ref 2.2–3.9)
Total Protein ELP: 7.2 g/dL (ref 6.0–8.5)

## 2018-10-13 ENCOUNTER — Encounter: Payer: Self-pay | Admitting: Internal Medicine

## 2018-10-13 ENCOUNTER — Ambulatory Visit (INDEPENDENT_AMBULATORY_CARE_PROVIDER_SITE_OTHER): Payer: Medicare Other | Admitting: Internal Medicine

## 2018-10-13 ENCOUNTER — Other Ambulatory Visit: Payer: Self-pay

## 2018-10-13 VITALS — Temp 98.7°F | Ht 62.0 in | Wt 160.0 lb

## 2018-10-13 DIAGNOSIS — B373 Candidiasis of vulva and vagina: Secondary | ICD-10-CM

## 2018-10-13 DIAGNOSIS — R05 Cough: Secondary | ICD-10-CM | POA: Diagnosis not present

## 2018-10-13 DIAGNOSIS — B3731 Acute candidiasis of vulva and vagina: Secondary | ICD-10-CM

## 2018-10-13 DIAGNOSIS — J0100 Acute maxillary sinusitis, unspecified: Secondary | ICD-10-CM

## 2018-10-13 DIAGNOSIS — R059 Cough, unspecified: Secondary | ICD-10-CM

## 2018-10-13 MED ORDER — FLUCONAZOLE 100 MG PO TABS: 100.0000 mg | ORAL_TABLET | Freq: Every day | ORAL | 0 refills | Status: AC

## 2018-10-13 MED ORDER — AMOXICILLIN 875 MG PO TABS: 875.0000 mg | ORAL_TABLET | Freq: Two times a day (BID) | ORAL | 0 refills | Status: AC

## 2018-10-13 MED ORDER — PROMETHAZINE-DM 6.25-15 MG/5ML PO SYRP: 5.0000 mL | ORAL_SOLUTION | Freq: Four times a day (QID) | ORAL | 0 refills | Status: DC | PRN

## 2018-10-13 NOTE — Progress Notes (Signed)
Date:  10/13/2018   Name:  Sarah [name]   DOB:  Oct 18, 1955   MRN:  [id number]  This encounter was conducted via video encounter due to the need for social distancing in light of the Covid-19 pandemic.  The patient was correctly identified.  I advised that I am conducting the visit from a secure room in my office at [facility] clinic.   The limitations of this form of encounter were discussed with the patient and he/she agreed to proceed.  Chief Complaint: Cough (Started coughing 2 weeks now. Sore throat and body aches. Temp 98.7 orally. ) Pt has been self isolating since February, no known sick contacts, minimal community exposure, no fever, no shortness of breath.  Pre-screening from oncology suggested she be seen here and evaluated.  Cough  This is a new problem. Episode onset: started 4 weeks ago when the pollen began to fall. The problem has been unchanged. The cough is non-productive (loose but no production). Associated symptoms include myalgias (in thighs), nasal congestion, postnasal drip and a sore throat. Pertinent negatives include no chest pain, chills, ear pain, fever, shortness of breath or wheezing. Associated symptoms comments: She has had no fever at any time. She has tried prescription cough suppressant (allegra and singulair) for the symptoms. The treatment provided mild relief. Her past medical history is significant for environmental allergies. and active treatment for multiple myeloma    Review of Systems  Constitutional: Negative for chills and fever.  HENT: Positive for postnasal drip and sore throat. Negative for congestion, ear pain and trouble swallowing.   Respiratory: Positive for cough. Negative for chest tightness, shortness of breath and wheezing.   Cardiovascular: Negative for chest pain, palpitations and leg swelling.  Gastrointestinal: Positive for diarrhea. Negative for blood in stool.  Endocrine: Negative for polydipsia and polyuria.  Genitourinary:  Negative for dysuria.       Gets yeast with antibiotics  Musculoskeletal: Positive for myalgias (in thighs).  Allergic/Immunologic: Positive for environmental allergies.  Psychiatric/Behavioral: Negative for sleep disturbance.    Patient Active Problem List   Diagnosis Date Noted  . Hypomagnesemia 08/20/2018  . Goals of care, counseling/discussion 08/20/2018  . Low vitamin B12 level 08/20/2018  . Thrombocytopenia ([place]) 02/09/2018  . Benign essential HTN 08/21/2017  . Bilateral carotid artery stenosis 08/21/2017  . Cardiomyopathy, idiopathic ([place]) 08/21/2017  . Carotid artery stenosis, asymptomatic, bilateral 08/06/2017  . Thyroid nodule 08/06/2017  . Cardiac syncope 07/25/2017  . Iron deficiency anemia due to chronic blood loss 05/13/2017  . Spinal stenosis of lumbar region with neurogenic claudication 04/09/2017  . Magnesium deficiency 04/08/2017  . 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 LBBB (left bundle branch block) 10/10/2016  . Long term prescription opiate use 09/07/2016  . Chronic pain syndrome 07/08/2016  . Pain medication agreement signed 07/08/2016  . Chronic diastolic CHF (congestive heart failure), NYHA class 2 ([place]) 06/05/2016  . Hx of cardiac catheterization 06/05/2016  . LVH (left ventricular hypertrophy) due to hypertensive disease, with heart failure ([place]) 06/05/2016  . Mild aortic valve stenosis 06/05/2016  . 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[place]) 06/05/2016  . Sinus congestion 05/30/2016  . Environmental and seasonal allergies 04/19/2016  . Insomnia 04/19/2016  . Bicuspid aortic valve 04/19/2016  . Asthma 04/19/2016  . Aortic regurgitation 04/19/2016  . History of CHF (congestive heart failure) 04/19/2016  . DM (diabetes mellitus), type 2 with renal complications ([place]) 42/59/5638  . GERD (gastroesophageal reflux disease) 04/12/2016  . Depression with anxiety 04/12/2016  .  Irritable bowel syndrome (IBS) 04/12/2016  . Hepatic steatosis  04/12/2016  . Multiple myeloma in remission ([place]) 04/02/2016  . 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[place]) 07/06/2015  . Colitis 02/14/2013  . Disequilibrium 01/14/2013    Allergies  Allergen Reactions  . Oxycodone-Acetaminophen Anaphylaxis    Swelling and rash  . Codeine   . Benadryl [Diphenhydramine] Palpitations  . Morphine Itching and Rash  . Ondansetron Diarrhea  . Tylenol [Acetaminophen] Itching and Rash    Past Surgical History:  Procedure Laterality Date  . ABDOMINAL HYSTERECTOMY    . 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 CARDIAC ELECTROPHYSIOLOGY MAPPING AND ABLATION    . CARPAL TUNNEL RELEASE Bilateral   . CHOLECYSTECTOMY  2008  . COLONOSCOPY WITH PROPOFOL N/A 05/07/2017   Procedure: COLONOSCOPY WITH PROPOFOL;  Surgeon: [name], MD;  Location: [facility] ENDOSCOPY;  Service: Gastroenterology;  Laterality: N/A;  . ESOPHAGOGASTRODUODENOSCOPY (EGD) WITH PROPOFOL N/A 05/07/2017   Procedure: ESOPHAGOGASTRODUODENOSCOPY (EGD) WITH PROPOFOL;  Surgeon: [name], MD;  Location: [facility] ENDOSCOPY;  Service: Gastroenterology;  Laterality: N/A;  . FOOT SURGERY Bilateral   . INCONTINENCE SURGERY  2009  . PARTIAL HYSTERECTOMY  03/1996   fibroids  . TONSILLECTOMY  2007    Social History   Tobacco Use  . Smoking status: Former Smoker    Packs/day: 1.00    Years: 20.00    Pack years: 20.00    Types: Cigarettes    Last attempt to quit: 07/02/1991    Years since quitting: 27.3  . Smokeless tobacco: Never Used  Substance Use Topics  . Alcohol use: Yes    Alcohol/week: 2.0 standard drinks    Types: 1 Glasses of wine, 1 Cans of beer per week    Comment: occassional  . Drug use: No     Medication list has been reviewed and updated.  Current Meds  Medication Sig  . allopurinol (ZYLOPRIM) 100 MG tablet TAKE 1 TABLET(100 MG) BY MOUTH DAILY as needed  . aspirin 81 MG chewable tablet Chew 1 tablet (81 mg total) by mouth daily.  . benzonatate (TESSALON) 200 MG capsule Take 1  capsule (200 mg total) by mouth 3 (three) times daily as needed for cough.  . bisoprolol (ZEBETA) 10 MG tablet TAKE 1 TABLET(10 MG) BY MOUTH DAILY  . diphenoxylate-atropine (LOMOTIL) 2.5-0.025 MG tablet Take 2 tablets by mouth daily as needed for diarrhea or loose stools.  . DULoxetine (CYMBALTA) 30 MG capsule TAKE 3 CAPSULES BY MOUTH  DAILY  . fentaNYL (DURAGESIC - DOSED MCG/HR) 25 MCG/HR patch Place 25 mcg onto the skin every 3 (three) days.   . fexofenadine (ALLEGRA) 180 MG tablet TAKE 1 TABLET(180 MG) BY MOUTH DAILY  . fluticasone (FLONASE) 50 MCG/ACT nasal spray Place 2 sprays into both nostrils daily.  Marland Kitchen glucose blood (ONE TOUCH ULTRA TEST) test strip   . hydrOXYzine (ATARAX/VISTARIL) 10 MG tablet TAKE 1 TABLET(10 MG) BY MOUTH THREE TIMES DAILY AS NEEDED  . lisinopril (PRINIVIL,ZESTRIL) 10 MG tablet Take 10 mg by mouth daily.  . Magnesium Bisglycinate (MAG GLYCINATE) 100 MG TABS Take 100 mg by mouth 2 (two) times daily.  Marland Kitchen MAGNESIUM CL-CALCIUM CARBONATE PO Take 1 tablet by mouth daily.  . Menaquinone-7 (VITAMIN K2) 100 MCG CAPS Take by mouth.  . metFORMIN (GLUCOPHAGE-XR) 500 MG 24 hr tablet TAKE 1 TABLET BY MOUTH  DAILY WITH BREAKFAST (Patient taking differently: Take 500 mg by mouth every evening. )  . Misc Natural Products (OSTEO BI-FLEX ADV TRIPLE ST PO)  Take 2 tablets by mouth daily.  . Multiple Vitamins-Minerals (HAIR/SKIN/NAILS/BIOTIN PO) Take 1 capsule by mouth 3 (three) times daily.  . NON FORMULARY   . Omega 3-6-9 Fatty Acids (OMEGA 3-6-9 COMPLEX) CAPS Take by mouth.  Marland Kitchen omeprazole (PRILOSEC) 40 MG capsule Take 1 capsule (40 mg total) by mouth daily.  . pravastatin (PRAVACHOL) 20 MG tablet TAKE 1 TABLET BY MOUTH  DAILY  . pregabalin (LYRICA) 50 MG capsule Take 100 mg by mouth daily.  . promethazine (PHENERGAN) 25 MG tablet TAKE 1 TABLET BY MOUTH TWICE DAILY BEFORE MEALS AS NEEDED  . tiZANidine (ZANAFLEX) 4 MG tablet Take 1 tablet (4 mg total) by mouth every 8 (eight) hours as  needed for muscle spasms.  . zaleplon (SONATA) 10 MG capsule TAKE 1 CAPSULE BY MOUTH EVERY DAY AT BEDTIME AS NEEDED FOR SLEEP    PHQ 2/9 Scores 10/13/2018 02/09/2018 08/06/2017 08/06/2017  PHQ - 2 Score 0 2 0 0  PHQ- 9 Score - 10 6 0    BP Readings from Last 3 Encounters:  09/02/18 120/77  08/20/18 131/81  08/06/18 124/70    Physical Exam Constitutional:      General: She is not in acute distress.    Appearance: Normal appearance.  HENT:     Head: Normocephalic.  Pulmonary:     Comments: Normal voice, occasional dry tight cough noted.  No evidence of dyspnea Neurological:     Mental Status: She is alert and oriented to person, place, and time.  Psychiatric:        Attention and Perception: Attention normal.        Mood and Affect: Mood normal.        Speech: Speech normal.        Cognition and Memory: Cognition normal.     Wt Readings from Last 3 Encounters:  10/13/18 160 lb (72.6 kg)  08/20/18 160 lb 13.2 oz (73 kg)  07/13/18 155 lb (70.3 kg)    Temp 98.7 F (37.1 C) (Oral)   Ht 5' 2"  (1.575 m)   Wt 160 lb (72.6 kg)   BMI 29.26 kg/m   Assessment and Plan: 1. Acute maxillary sinusitis, recurrence not specified Post nasal drainage from allergies contributing In view of hx of strep and immune compromise, will treat empirically Not suspicious for Covid-19 - amoxicillin (AMOXIL) 875 MG tablet; Take 1 tablet (875 mg total) by mouth 2 (two) times daily for 10 days.  Dispense: 20 tablet; Refill: 0  2. Cough Refill syrup Continue Allegra and singulair - promethazine-dextromethorphan (PROMETHAZINE-DM) 6.25-15 MG/5ML syrup; Take 5 mLs by mouth 4 (four) times daily as needed for cough.  Dispense: 118 mL; Refill: 0  3. Yeast vaginitis - fluconazole (DIFLUCAN) 100 MG tablet; Take 1 tablet (100 mg total) by mouth daily for 3 days.  Dispense: 3 tablet; Refill: 0  I spent 13 minutes on this encounter.  Partially dictated using Editor, commissioning. Any errors are unintentional.   Halina Maidens, MD [facility]  10/13/2018

## 2018-10-14 ENCOUNTER — Other Ambulatory Visit: Payer: Medicare Other

## 2018-10-15 ENCOUNTER — Other Ambulatory Visit: Payer: Medicare Other

## 2018-10-15 ENCOUNTER — Ambulatory Visit: Payer: Medicare Other | Admitting: Hematology and Oncology

## 2018-10-15 ENCOUNTER — Ambulatory Visit: Payer: Medicare Other

## 2018-10-15 ENCOUNTER — Other Ambulatory Visit: Payer: Self-pay | Admitting: Internal Medicine

## 2018-10-20 ENCOUNTER — Inpatient Hospital Stay: Payer: Medicare Other

## 2018-10-20 ENCOUNTER — Other Ambulatory Visit: Payer: Self-pay | Admitting: Hematology and Oncology

## 2018-10-20 ENCOUNTER — Telehealth: Payer: Self-pay | Admitting: *Deleted

## 2018-10-20 ENCOUNTER — Other Ambulatory Visit: Payer: Self-pay

## 2018-10-20 VITALS — BP 152/78 | HR 84 | Temp 95.6°F | Resp 18

## 2018-10-20 DIAGNOSIS — N183 Chronic kidney disease, stage 3 (moderate): Secondary | ICD-10-CM | POA: Diagnosis not present

## 2018-10-20 DIAGNOSIS — D649 Anemia, unspecified: Secondary | ICD-10-CM | POA: Diagnosis not present

## 2018-10-20 DIAGNOSIS — C9001 Multiple myeloma in remission: Secondary | ICD-10-CM | POA: Diagnosis not present

## 2018-10-20 DIAGNOSIS — D696 Thrombocytopenia, unspecified: Secondary | ICD-10-CM

## 2018-10-20 DIAGNOSIS — E538 Deficiency of other specified B group vitamins: Secondary | ICD-10-CM | POA: Diagnosis not present

## 2018-10-20 LAB — CBC WITH DIFFERENTIAL/PLATELET
Abs Immature Granulocytes: 0.03 10*3/uL (ref 0.00–0.07)
Basophils Absolute: 0 10*3/uL (ref 0.0–0.1)
Basophils Relative: 1 %
Eosinophils Absolute: 0.1 10*3/uL (ref 0.0–0.5)
Eosinophils Relative: 2 %
HCT: 34.8 % — ABNORMAL LOW (ref 36.0–46.0)
Hemoglobin: 12 g/dL (ref 12.0–15.0)
Immature Granulocytes: 1 %
Lymphocytes Relative: 19 %
Lymphs Abs: 1.2 10*3/uL (ref 0.7–4.0)
MCH: 30.1 pg (ref 26.0–34.0)
MCHC: 34.5 g/dL (ref 30.0–36.0)
MCV: 87.2 fL (ref 80.0–100.0)
Monocytes Absolute: 0.5 10*3/uL (ref 0.1–1.0)
Monocytes Relative: 7 %
Neutro Abs: 4.7 10*3/uL (ref 1.7–7.7)
Neutrophils Relative %: 70 %
Platelets: 133 10*3/uL — ABNORMAL LOW (ref 150–400)
RBC: 3.99 MIL/uL (ref 3.87–5.11)
RDW: 13 % (ref 11.5–15.5)
WBC: 6.6 10*3/uL (ref 4.0–10.5)
nRBC: 0 % (ref 0.0–0.2)

## 2018-10-20 LAB — COMPREHENSIVE METABOLIC PANEL
ALT: 54 U/L — ABNORMAL HIGH (ref 0–44)
AST: 45 U/L — ABNORMAL HIGH (ref 15–41)
Albumin: 4.6 g/dL (ref 3.5–5.0)
Alkaline Phosphatase: 104 U/L (ref 38–126)
Anion gap: 8 (ref 5–15)
BUN: 28 mg/dL — ABNORMAL HIGH (ref 8–23)
CO2: 22 mmol/L (ref 22–32)
Calcium: 9 mg/dL (ref 8.9–10.3)
Chloride: 104 mmol/L (ref 98–111)
Creatinine, Ser: 1.12 mg/dL — ABNORMAL HIGH (ref 0.44–1.00)
GFR calc Af Amer: >60 mL/min (ref >60)
GFR calc non Af Amer: 53 mL/min — ABNORMAL LOW (ref >60)
Glucose, Bld: 195 mg/dL — ABNORMAL HIGH (ref 70–99)
Potassium: 4.1 mmol/L (ref 3.5–5.1)
Sodium: 134 mmol/L — ABNORMAL LOW (ref 135–145)
Total Bilirubin: 0.6 mg/dL (ref 0.3–1.2)
Total Protein: 7.7 g/dL (ref 6.5–8.1)

## 2018-10-20 LAB — MAGNESIUM: Magnesium: 1.3 mg/dL — ABNORMAL LOW (ref 1.7–2.4)

## 2018-10-20 MED ORDER — MAGNESIUM SULFATE 4 GM/100ML IV SOLN
4.0000 g | Freq: Once | INTRAVENOUS | Status: AC
  Administered 2018-10-20: 4 g via INTRAVENOUS
  Filled 2018-10-20: qty 100

## 2018-10-20 MED ORDER — SODIUM CHLORIDE 0.9 % IV SOLN
Freq: Once | INTRAVENOUS | Status: AC
  Administered 2018-10-20: 12:00:00 via INTRAVENOUS
  Filled 2018-10-20: qty 250

## 2018-10-20 NOTE — Patient Instructions (Signed)
Magnesium Sulfate injection What is this medicine? MAGNESIUM SULFATE (mag NEE zee um SUL fate) is an electrolyte injection commonly used to treat low magnesium levels in your blood. It is also used to prevent or control seizures in women with preeclampsia or eclampsia. This medicine may be used for other purposes; ask your health care provider or pharmacist if you have questions. What should I tell my health care provider before I take this medicine? They need to know if you have any of these conditions: -heart disease -history of irregular heart beat -kidney disease -an unusual or allergic reaction to magnesium sulfate, medicines, foods, dyes, or preservatives -pregnant or trying to get pregnant -breast-feeding How should I use this medicine? This medicine is for infusion into a vein. It is given by a health care professional in a hospital or clinic setting. Talk to your pediatrician regarding the use of this medicine in children. While this drug may be prescribed for selected conditions, precautions do apply. Overdosage: If you think you have taken too much of this medicine contact a poison control center or emergency room at once. NOTE: This medicine is only for you. Do not share this medicine with others. What if I miss a dose? This does not apply. What may interact with this medicine? This medicine may interact with the following medications: -certain medicines for anxiety or sleep -certain medicines for seizures like phenobarbital -digoxin -medicines that relax muscles for surgery -narcotic medicines for pain This list may not describe all possible interactions. Give your health care provider a list of all the medicines, herbs, non-prescription drugs, or dietary supplements you use. Also tell them if you smoke, drink alcohol, or use illegal drugs. Some items may interact with your medicine. What should I watch for while using this medicine? Your condition will be monitored carefully  while you are receiving this medicine. You may need blood work done while you are receiving this medicine. What side effects may I notice from receiving this medicine? Side effects that you should report to your doctor or health care professional as soon as possible: -allergic reactions like skin rash, itching or hives, swelling of the face, lips, or tongue -facial flushing -muscle weakness -signs and symptoms of low blood pressure like dizziness; feeling faint or lightheaded, falls; unusually weak or tired -signs and symptoms of a dangerous change in heartbeat or heart rhythm like chest pain; dizziness; fast or irregular heartbeat; palpitations; breathing problems -sweating This list may not describe all possible side effects. Call your doctor for medical advice about side effects. You may report side effects to FDA at 1-800-FDA-1088. Where should I keep my medicine? This drug is given in a hospital or clinic and will not be stored at home. NOTE: This sheet is a summary. It may not cover all possible information. If you have questions about this medicine, talk to your doctor, pharmacist, or health care provider.  2019 [employer] Standard (2016-01-03 12:31:42)   

## 2018-10-21 ENCOUNTER — Encounter: Payer: Self-pay | Admitting: Hematology and Oncology

## 2018-10-21 ENCOUNTER — Inpatient Hospital Stay (HOSPITAL_BASED_OUTPATIENT_CLINIC_OR_DEPARTMENT_OTHER): Payer: Medicare Other | Admitting: Hematology and Oncology

## 2018-10-21 DIAGNOSIS — M47817 Spondylosis without myelopathy or radiculopathy, lumbosacral region: Secondary | ICD-10-CM | POA: Diagnosis not present

## 2018-10-21 DIAGNOSIS — Z9484 Stem cells transplant status: Secondary | ICD-10-CM | POA: Diagnosis not present

## 2018-10-21 DIAGNOSIS — N183 Chronic kidney disease, stage 3 (moderate): Secondary | ICD-10-CM

## 2018-10-21 DIAGNOSIS — I6523 Occlusion and stenosis of bilateral carotid arteries: Secondary | ICD-10-CM | POA: Diagnosis not present

## 2018-10-21 DIAGNOSIS — Z79891 Long term (current) use of opiate analgesic: Secondary | ICD-10-CM | POA: Diagnosis not present

## 2018-10-21 DIAGNOSIS — D696 Thrombocytopenia, unspecified: Secondary | ICD-10-CM | POA: Diagnosis not present

## 2018-10-21 DIAGNOSIS — Z7189 Other specified counseling: Secondary | ICD-10-CM

## 2018-10-21 DIAGNOSIS — R110 Nausea: Secondary | ICD-10-CM

## 2018-10-21 DIAGNOSIS — E538 Deficiency of other specified B group vitamins: Secondary | ICD-10-CM

## 2018-10-21 DIAGNOSIS — M47816 Spondylosis without myelopathy or radiculopathy, lumbar region: Secondary | ICD-10-CM | POA: Diagnosis not present

## 2018-10-21 DIAGNOSIS — C9001 Multiple myeloma in remission: Secondary | ICD-10-CM

## 2018-10-21 DIAGNOSIS — M533 Sacrococcygeal disorders, not elsewhere classified: Secondary | ICD-10-CM | POA: Diagnosis not present

## 2018-10-21 DIAGNOSIS — D649 Anemia, unspecified: Secondary | ICD-10-CM | POA: Insufficient documentation

## 2018-10-21 DIAGNOSIS — M7062 Trochanteric bursitis, left hip: Secondary | ICD-10-CM | POA: Diagnosis not present

## 2018-10-21 LAB — PROTEIN ELECTROPHORESIS, SERUM
A/G Ratio: 1.4 (ref 0.7–1.7)
Albumin ELP: 4.2 g/dL (ref 2.9–4.4)
Alpha-1-Globulin: 0.2 g/dL (ref 0.0–0.4)
Alpha-2-Globulin: 1 g/dL (ref 0.4–1.0)
Beta Globulin: 1 g/dL (ref 0.7–1.3)
Gamma Globulin: 0.8 g/dL (ref 0.4–1.8)
Globulin, Total: 3 g/dL (ref 2.2–3.9)
Total Protein ELP: 7.2 g/dL (ref 6.0–8.5)

## 2018-10-21 LAB — KAPPA/LAMBDA LIGHT CHAINS
Kappa free light chain: 16.7 mg/L (ref 3.3–19.4)
Kappa, lambda light chain ratio: 0.13 — ABNORMAL LOW (ref 0.26–1.65)
Lambda free light chains: 130.9 mg/L — ABNORMAL HIGH (ref 5.7–26.3)

## 2018-10-21 MED ORDER — PROMETHAZINE HCL 25 MG PO TABS: ORAL_TABLET | ORAL | 0 refills | Status: DC

## 2018-10-21 NOTE — Progress Notes (Signed)
[facility]  [address], [address] [place], [place] [address] Phone: [phone]  Fax: [phone]   Telemedicine Office Visit:  10/21/2018  Referring physician: Glean Hess, MD  I connected with Sarah [name] on 10/21/18 at 1:14 PM EDT by videoconferencing and verified that I was speaking with the correct person using 2 identifiers.  The patient was at home.  I discussed the limitations, risk, security and privacy concerns of performing an evaluation and management service by videoconferencing and the availability of in person appointments.  I also discussed with the patient that there may be a patient responsible charge related to this service.  The patient expressed understanding and agreed to proceed.    Chief Complaint: Sarah [name] is a 63 y.o. 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  HPI:   The patient was last seen in the medical oncology clinic on 08/20/2018.  At that time, she had chronic back pain and nausea.  Exam was unremarkable.  Hemoglobin was 12.5 and MCV 89.4.  Creatinine was 1.23.  M-spike was 0.  Magnesium was 1.2.  She received magnesium sulfate 4 gm IV.  TSH was 2.105.  Folate was 21.6.  B12 was 295 (low).  She was contacted to begin oral B12.  Follow-up B12 was 391 on 10/08/2018.  Labs on 10/08/2018 included a hematocrit of 37, hemoglobin 12.4, MCV 87.7, platelets 147,000, white count 6200 with an ANC of 4400.  BUN was 41 and creatinine 1.66.  SPEP revealed no monoclonal protein.  Kappa free light chains were 23.7, lambda free light chains 181.5 with a ratio of 0.13.  Magnesium was 1.5.  Labs from 10/20/2018 included a hematocrit of 34.8, hemoglobin 12.0, MCV 87.2, platelets 133,000, white count 6600 with an ANC of 4700.  BUN was 28 with a creatinine of 1.12.  M-spike and free light chains are pending.  Magnesium was 1.3.  She received 4 gm  of IV magnesium.  During the interim, she has felt "ok".  She describes being tired and nauseated all of the time.  She has had nausea since her gallbladder surgery. She has been on magnesium bisglycinate for 1 month.  Her leg cramps are better.  She stated that she recently went to the nephrologist.  Her kidney function has improved from "40% to 50%".  She has a follow-up with urology for her bladder sling.  She remains on Fentanyl patches and muscle relaxants for spinal stenosis and pinched nerve.  Her jaw is better.  She plans to get her tooth fixed.   Past Medical History:  Diagnosis Date  . Abnormal stress test 02/14/2016   Overview:  Added automatically from request for surgery [id number]  . Anemia   . Anxiety   . Arthritis   . Bicuspid aortic valve   . CHF (congestive heart failure) ([place])   . CKD (chronic kidney disease) stage 3, GFR 30-59 ml/min (H[facility])   . Depression   . Diabetes mellitus ([place])   . Dizziness   . Fatty liver   . Frequent falls   . GERD (gastroesophageal reflux disease)   . Gout   . Heart murmur   . History of blood transfusion   . History of bone marrow transplant ([place])   . History of uterine fibroid   . Hypertension   . Hypomagnesemia   . Multiple myeloma ([place])   . Personal history of chemotherapy   .  Renal cyst     Past Surgical History:  Procedure Laterality Date  . ABDOMINAL HYSTERECTOMY    . 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 CARDIAC ELECTROPHYSIOLOGY MAPPING AND ABLATION    . CARPAL TUNNEL RELEASE Bilateral   . CHOLECYSTECTOMY  2008  . COLONOSCOPY WITH PROPOFOL N/A 05/07/2017   Procedure: COLONOSCOPY WITH PROPOFOL;  Surgeon: [name], MD;  Location: [facility] ENDOSCOPY;  Service: Gastroenterology;  Laterality: N/A;  . ESOPHAGOGASTRODUODENOSCOPY (EGD) WITH PROPOFOL N/A 05/07/2017   Procedure: ESOPHAGOGASTRODUODENOSCOPY (EGD) WITH PROPOFOL;  Surgeon: [name], MD;  Location: [facility] ENDOSCOPY;  Service: Gastroenterology;  Laterality: N/A;  . FOOT  SURGERY Bilateral   . INCONTINENCE SURGERY  2009  . PARTIAL HYSTERECTOMY  03/1996   fibroids  . TONSILLECTOMY  2007    Family History  Problem Relation Age of Onset  . Colon cancer Father   . Renal Disease Father   . Diabetes Mellitus II Father   . Melanoma Paternal Grandmother   . Breast cancer Maternal Aunt 47  . Anemia Mother   . Heart disease Mother   . Heart failure Mother   . Renal Disease Mother   . Congestive Heart Failure Mother   . Heart disease Maternal Uncle   . Throat cancer Maternal Uncle   . Lung cancer Maternal Uncle   . Liver disease Maternal Uncle   . Heart failure Maternal Uncle   . Hearing loss Son 38       Suicide     Social History:  reports that she quit smoking about 27 years ago. Her smoking use included cigarettes. She has a 20.00 pack-year smoking history. She has never used smokeless tobacco. She reports current alcohol use of about 2.0 standard drinks of alcohol per week. She reports that she does not use drugs.  Patient has not had ETOH in several months. She is on disability. She notes exposure to perchloroethylene [facility]). The patient is alone today.  Participants in the patient's visit and their role in the encounter included the patient and Sarah [name], Sarah [name], today.  The intake visit was provided by Sarah [name], Sarah [name].    Allergies:  Allergies  Allergen Reactions  . Oxycodone-Acetaminophen Anaphylaxis    Swelling and rash  . Codeine   . Benadryl [Diphenhydramine] Palpitations  . Morphine Itching and Rash  . Ondansetron Diarrhea  . Tylenol [Acetaminophen] Itching and Rash    Current Medications: Current Outpatient Medications  Medication Sig Dispense Refill  . allopurinol (ZYLOPRIM) 100 MG tablet TAKE 1 TABLET(100 MG) BY MOUTH DAILY as needed 30 tablet 5  . amoxicillin (AMOXIL) 875 MG tablet Take 1 tablet (875 mg total) by mouth 2 (two) times daily for 10 days. 20 tablet 0  . aspirin 81 MG chewable tablet Chew 1 tablet (81 mg  total) by mouth daily. 30 tablet 0  . bisoprolol (ZEBETA) 10 MG tablet TAKE 1 TABLET(10 MG) BY MOUTH DAILY 90 tablet 0  . DULoxetine (CYMBALTA) 30 MG capsule TAKE 3 CAPSULES BY MOUTH  DAILY 90 capsule 3  . fentaNYL (DURAGESIC - DOSED MCG/HR) 25 MCG/HR patch Place 25 mcg onto the skin every 3 (three) days.     . fexofenadine (ALLEGRA) 180 MG tablet TAKE 1 TABLET(180 MG) BY MOUTH DAILY 30 tablet 5  . glucose blood (ONE TOUCH ULTRA TEST) test strip     . lisinopril (PRINIVIL,ZESTRIL) 10 MG tablet Take 10 mg by mouth daily.    . Magnesium Bisglycinate (MAG GLYCINATE) 100 MG TABS Take 100 mg  by mouth 2 (two) times daily. 60 tablet 0  . MAGNESIUM CL-CALCIUM CARBONATE PO Take 1 tablet by mouth daily.    . Menaquinone-7 (VITAMIN K2) 100 MCG CAPS Take by mouth.    . metFORMIN (GLUCOPHAGE-XR) 500 MG 24 hr tablet TAKE 1 TABLET BY MOUTH  DAILY WITH BREAKFAST (Patient taking differently: Take 500 mg by mouth every evening. ) 90 tablet 3  . Misc Natural Products (OSTEO BI-FLEX ADV TRIPLE ST PO) Take 2 tablets by mouth daily.    . Multiple Vitamins-Minerals (HAIR/SKIN/NAILS/BIOTIN PO) Take 1 capsule by mouth 3 (three) times daily.    . NON FORMULARY     . Omega 3-6-9 Fatty Acids (OMEGA 3-6-9 COMPLEX) CAPS Take by mouth.    Marland Kitchen omeprazole (PRILOSEC) 40 MG capsule Take 1 capsule (40 mg total) by mouth daily. 30 capsule 5  . pravastatin (PRAVACHOL) 20 MG tablet TAKE 1 TABLET BY MOUTH  DAILY 90 tablet 3  . tiZANidine (ZANAFLEX) 4 MG tablet Take 1 tablet (4 mg total) by mouth every 8 (eight) hours as needed for muscle spasms. (Patient taking differently: Take 6 mg by mouth every 8 (eight) hours as needed for muscle spasms. ) 270 tablet 3  . zaleplon (SONATA) 10 MG capsule TAKE 1 CAPSULE BY MOUTH EVERY DAY AT BEDTIME AS NEEDED FOR SLEEP 30 capsule 5  . benzonatate (TESSALON) 200 MG capsule Take 1 capsule (200 mg total) by mouth 3 (three) times daily as needed for cough. (Patient not taking: Reported on 10/21/2018) 60  capsule 0  . diphenoxylate-atropine (LOMOTIL) 2.5-0.025 MG tablet Take 2 tablets by mouth daily as needed for diarrhea or loose stools. (Patient not taking: Reported on 10/21/2018) 45 tablet 2  . fluticasone (FLONASE) 50 MCG/ACT nasal spray Place 2 sprays into both nostrils daily. (Patient not taking: Reported on 10/21/2018) 16 g 2  . hydrOXYzine (ATARAX/VISTARIL) 10 MG tablet TAKE 1 TABLET(10 MG) BY MOUTH THREE TIMES DAILY AS NEEDED (Patient not taking: Reported on 10/21/2018) 90 tablet 0  . pregabalin (LYRICA) 50 MG capsule Take 100 mg by mouth daily.    . promethazine (PHENERGAN) 25 MG tablet TAKE 1 TABLET BY MOUTH TWICE DAILY BEFORE MEALS AS NEEDED (Patient not taking: Reported on 10/21/2018) 60 tablet 0  . promethazine-dextromethorphan (PROMETHAZINE-DM) 6.25-15 MG/5ML syrup Take 5 mLs by mouth 4 (four) times daily as needed for cough. (Patient not taking: Reported on 10/21/2018) 118 mL 0   No current facility-administered medications for this visit.     Review of Systems:  GENERAL:  Feels "ok".  Tired.  No fevers, sweats or weight loss. PERFORMANCE STATUS (ECOG):  1 HEENT:  Jaw better.  No visual changes, runny nose, sore throat, mouth sores or tenderness. Lungs: No shortness of breath or cough.  No hemoptysis. Cardiac:  No chest pain, palpitations, orthopnea, or PND. GI:  Chronic nausea s/p gallbladder surgery.  Constipation/diarrhea.  No vomiting, melena or hematochezia.  EGD and colonoscopy 05/2017. GU:  Bladder leakage (f/u urology bladder sling).  No urgency, frequency, dysuria, or hematuria. Musculoskeletal:  Chronic neck and back pain.  Spinal stenosis and pinched nerve.  Leg cramps are better.  No joint pain.  No muscle tenderness. Extremities:  No pain or swelling. Skin:  No rashes or skin changes. Neuro:  No headache, numbness or weakness, balance or coordination issues. Endocrine:  Diabetes.  No thyroid issues, hot flashes or night sweats. Psych:  No mood changes, depression or  anxiety. Pain:  Chronic upper back pain (5 out of 10)  on Fentanyl patch. Review of systems:  All other systems reviewed and found to be negative.   Physical Exam: There were no vitals taken for this visit. GENERAL:  Well developed, well nourished, woman sitting comfortably at home in no acute distress. MENTAL STATUS:  Alert and oriented to person, place and time. HEAD:  Hair pulled up.  Normocephalic, atraumatic, face symmetric, no Cushingoid features. NEUROLOGICAL: Unremarkable. PSYCH:  Appropriate.    Appointment on 10/20/2018  Component Date Value Ref Range Status  . Magnesium 10/20/2018 1.3* 1.7 - 2.4 mg/dL Final   Performed at [facility], [address] [place], [place] [address]  . Sodium 10/20/2018 134* 135 - 145 mmol/L Final  . Potassium 10/20/2018 4.1  3.5 - 5.1 mmol/L Final  . Chloride 10/20/2018 104  98 - 111 mmol/L Final  . CO2 10/20/2018 22  22 - 32 mmol/L Final  . Glucose, Bld 10/20/2018 195* 70 - 99 mg/dL Final  . BUN 10/20/2018 28* 8 - 23 mg/dL Final  . Creatinine, Ser 10/20/2018 1.12* 0.44 - 1.00 mg/dL Final  . Calcium 10/20/2018 9.0  8.9 - 10.3 mg/dL Final  . Total Protein 10/20/2018 7.7  6.5 - 8.1 g/dL Final  . Albumin 10/20/2018 4.6  3.5 - 5.0 g/dL Final  . AST 10/20/2018 45* 15 - 41 U/L Final  . ALT 10/20/2018 54* 0 - 44 U/L Final  . Alkaline Phosphatase 10/20/2018 104  38 - 126 U/L Final  . Total Bilirubin 10/20/2018 0.6  0.3 - 1.2 mg/dL Final  . GFR calc non Af Amer 10/20/2018 53* >60 mL/min Final  . GFR calc Af Amer 10/20/2018 >60  >60 mL/min Final  . Anion gap 10/20/2018 8  5 - 15 Final   Performed at [facility], [address], [place], [place] [address]  . WBC 10/20/2018 6.6  4.0 - 10.5 K/uL Final  . RBC 10/20/2018 3.99  3.87 - 5.11 MIL/uL Final  . Hemoglobin 10/20/2018 12.0  12.0 - 15.0 g/dL Final  . HCT 10/20/2018 34.8* 36.0 - 46.0 % Final  . MCV 10/20/2018 87.2  80.0 - 100.0 fL Final  . MCH 10/20/2018 30.1  26.0 -  34.0 pg Final  . MCHC 10/20/2018 34.5  30.0 - 36.0 g/dL Final  . RDW 10/20/2018 13.0  11.5 - 15.5 % Final  . Platelets 10/20/2018 133* 150 - 400 K/uL Final  . nRBC 10/20/2018 0.0  0.0 - 0.2 % Final  . Neutrophils Relative % 10/20/2018 70  % Final  . Neutro Abs 10/20/2018 4.7  1.7 - 7.7 K/uL Final  . Lymphocytes Relative 10/20/2018 19  % Final  . Lymphs Abs 10/20/2018 1.2  0.7 - 4.0 K/uL Final  . Monocytes Relative 10/20/2018 7  % Final  . Monocytes Absolute 10/20/2018 0.5  0.1 - 1.0 K/uL Final  . Eosinophils Relative 10/20/2018 2  % Final  . Eosinophils Absolute 10/20/2018 0.1  0.0 - 0.5 K/uL Final  . Basophils Relative 10/20/2018 1  % Final  . Basophils Absolute 10/20/2018 0.0  0.0 - 0.1 K/uL Final  . Immature Granulocytes 10/20/2018 1  % Final  . Abs Immature Granulocytes 10/20/2018 0.03  0.00 - 0.07 K/uL Final   Performed at [facility], [address] [place],  [address]    Assessment:  Amilliana Hayworth is a 63 y.o. female with stage III IgA lambda light chain multiple myeloma s/p autologous stem cell transplant in 06/14/2015 at the [facility].  Bone  marrow revealed 80% plasma cells.  She initially underwent induction with RVD.  Revlimid maintenance was discontinued on 01/21/2017 secondary to intolerance.    M-spike has been followed:  0 on 04/02/2016 - 10/20/2018; and 0.2 on 09/02/2017.  Lambda light chains have been followed: 22.2 (ratio 0.56) on 07/03/2017, 30.8 (ratio 0.78) on 09/02/2017, 36.9 (ratio 0.40) on 10/21/2017, 37.4 (ratio 0.41) on 12/16/2017, 70.7(ratio 0.31)  on 02/17/2018, 64.2 (ratio 0.27) on 04/07/2018, 78.9 (ratio 0.18) on 05/26/2018, 128.8 (ratio 0.17) on 08/06/2018, 181.5 (ratio 0.13) on 10/08/2018, and 130.9 (ratio 0.13) on 10/20/2018.  Bone survey on 04/08/2016 and 05/28/2017 revealed no definite lytic lesion seen in the visualized skeleton.    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  Zometa was  discontinued in 01/2017.  She has B12 deficiency.  B12 was 254 on 04/09/2017, 295 on 08/20/2018, and 391 on 10/08/2018.  She began oral B12.  Symptomatically, she remains fatigued.  She has chronic upper back pain secondary to spinal stenosis. She has chronic nausea s/p gallbladder surgery.  Plan: 1.   Review labs from 10/20/2018. 2.   Stage III multiple myeloma  She is s/p autologous stem cell transplant in 06/2015.  She if off maintenance Revlimid secondary to intolerance.  M-spike was 0 on 10/20/2018.  Lambda light light chains 130.9 (stable since 08/06/2018).  Reschedule bone survey.  Continue close surveillance.  Patient to bring in old records for review. 3.   Normocytic anemia  Hemoglobin 12.0.  MCV 87.2.  Anemia work-up on 08/20/2018 confirmed B12 deficiency.  Anemia has improved on oral B12.  Folate and TSH were normal.  Suspect some component of anemia of chronic renal disease. 4.   Stage III chronic kidney disease  BUN 28.  Creatinine 1.12.  CrCl improved.  Patient followed by Dr. Holley Raring. 5.   Hypomagnesemia, chronic  Magnesium 1.3 yesterday.  She received 4 gm IV magnesium.  Symptoms seem improved on oral magnesium bisglycinate.  RTC every 2 week labs (Mg) and +/- magnesium. 6.   Osteonecrosis of the jaw  ONJ occurred in 01/2017 secondary to Zometa.  She receives no further bisphosphonates.  Symptomatically, her jaw is better. 7.   Chronic back pain  Etiology is not felt secondary to multiple myeloma.  Patient on Fentanyl patches per [facility] pain clinic. 8.   Grade I-II peripheral neuropathy  Etiology secondary to prior chemotherapy.  Continue Lyrica.  Continue to monitor. 9.   Chronic nausea  Patient notes nausea since gallbladder surgery.  Rx:  Phenergan 25 mg po BID prn. 10.   RTC in 2 months for MD assessment and labs (CBC with diff, CMP, Mg, SPEP, FLCA).  I discussed the assessment and treatment plan with the patient.  The patient was provided an opportunity  to ask questions and all were answered.  The patient agreed with the plan and demonstrated an understanding of the instructions.  The patient was advised to call back or seek an in person evaluation if the symptoms worsen or if the condition fails to improve as anticipated.  I provided 23 minutes (1:14 PM - 1:36 PM) of face-to-face video visit time during this this encounter and > 50% was spent counseling as documented under my assessment and plan.  I provided these services from the [facility] office.   Lequita Asal, MD  10/21/2018, 1:14 PM

## 2018-10-21 NOTE — Progress Notes (Signed)
Pain to upper back (pain level 5) Pain noted to neck occ pain. The patient DOB, name and address has been verified by phone today. Cbg, CMA

## 2018-10-22 ENCOUNTER — Telehealth: Payer: Self-pay

## 2018-10-22 NOTE — Telephone Encounter (Signed)
-----   Message from Lequita Asal, MD sent at 10/21/2018  3:55 PM EDT ----- Regarding: Please call patient  Free light chains are better.  M ----- Message ----- From: Buel Ream, [facility] [place] Sent: 10/20/2018  10:34 AM EDT To: Lequita Asal, MD

## 2018-10-22 NOTE — Telephone Encounter (Signed)
Left VM informing patient Sarah [name] has improved. Number provided should patient have any questions.

## 2018-10-28 ENCOUNTER — Ambulatory Visit (INDEPENDENT_AMBULATORY_CARE_PROVIDER_SITE_OTHER): Payer: Medicare Other | Admitting: Internal Medicine

## 2018-10-28 ENCOUNTER — Encounter: Payer: Self-pay | Admitting: Internal Medicine

## 2018-10-28 ENCOUNTER — Other Ambulatory Visit: Payer: Self-pay

## 2018-10-28 VITALS — BP 135/80 | HR 75 | Ht 62.0 in | Wt 160.0 lb

## 2018-10-28 DIAGNOSIS — F5101 Primary insomnia: Secondary | ICD-10-CM

## 2018-10-28 DIAGNOSIS — F4321 Adjustment disorder with depressed mood: Secondary | ICD-10-CM

## 2018-10-28 MED ORDER — DIAZEPAM 2 MG PO TABS: 2.0000 mg | ORAL_TABLET | Freq: Two times a day (BID) | ORAL | 0 refills | Status: DC | PRN

## 2018-10-28 NOTE — Progress Notes (Signed)
Date:  10/28/2018   Name:  Sarah [name]   DOB:  1956/02/20   MRN:  [id number]  I connected with this patient, Sarah [name], by telephone at the patient's home.  I verified that I am speaking with the correct person using two identifiers. This visit was conducted via telephone due to the Covid-19 outbreak from my office at [facility] in [place], [place]. I discussed the limitations, risks, security and privacy concerns of performing an evaluation and management service by telephone. I also discussed with the patient that there may be a patient responsible charge related to this service. The patient expressed understanding and agreed to proceed.  Chief Complaint: No chief complaint on file.  Acute Grief reaction - Patient reports that her son who lives out of state committed suicide yesterday. She is very jittery inside and feels like she needs something to help take the edge off.   She is arranging for cremation and then will travel to [facility] for a memorial service once travel is safer.   She feels that she is coping well, she is sad and anxious but not suicidal.  She lives with her sister who is working from home full time so she has good support. She says that she is very sensitive to sedatives but has taken low dose valium in the past. She is also having trouble sleeping.  She takes Sonata about 2 hours before bed and has trouble going to sleep. She sleeps only about 4 hours per night.   Review of Systems  Constitutional: Negative for chills, fatigue and fever.  Respiratory: Negative for chest tightness, shortness of breath and wheezing.   Cardiovascular: Negative for chest pain.  Neurological: Negative for light-headedness and headaches.  Psychiatric/Behavioral: Positive for dysphoric mood and sleep disturbance. Negative for suicidal ideas. The patient is nervous/anxious.     Patient Active Problem List   Diagnosis Date Noted  . Normocytic anemia 10/21/2018  . Hypomagnesemia  08/20/2018  . Goals of care, counseling/discussion 08/20/2018  . Low vitamin B12 level 08/20/2018  . Thrombocytopenia ([place]) 02/09/2018  . Benign essential HTN 08/21/2017  . Bilateral carotid artery stenosis 08/21/2017  . Cardiomyopathy, idiopathic ([place]) 08/21/2017  . Carotid artery stenosis, asymptomatic, bilateral 08/06/2017  . Thyroid nodule 08/06/2017  . Cardiac syncope 07/25/2017  . Iron deficiency anemia due to chronic blood loss 05/13/2017  . Spinal stenosis of lumbar region with neurogenic claudication 04/09/2017  . Magnesium deficiency 04/08/2017  . 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 LBBB (left bundle branch block) 10/10/2016  . Long term prescription opiate use 09/07/2016  . Chronic pain syndrome 07/08/2016  . Pain medication agreement signed 07/08/2016  . Chronic diastolic CHF (congestive heart failure), NYHA class 2 ([place]) 06/05/2016  . Hx of cardiac catheterization 06/05/2016  . LVH (left ventricular hypertrophy) due to hypertensive disease, with heart failure ([place]) 06/05/2016  . Mild aortic valve stenosis 06/05/2016  . 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[place]) 06/05/2016  . Sinus congestion 05/30/2016  . Environmental and seasonal allergies 04/19/2016  . Insomnia 04/19/2016  . Bicuspid aortic valve 04/19/2016  . Asthma 04/19/2016  . Aortic regurgitation 04/19/2016  . History of CHF (congestive heart failure) 04/19/2016  . DM (diabetes mellitus), type 2 with renal complications ([place]) 25/00/3704  . GERD (gastroesophageal reflux disease) 04/12/2016  . Depression with anxiety 04/12/2016  . Irritable bowel syndrome (IBS) 04/12/2016  . Hepatic steatosis 04/12/2016  . Multiple myeloma in remission ([place]) 04/02/2016  . History of autologous stem cell  transplant ([place]) 07/06/2015  . Colitis 02/14/2013  . Disequilibrium 01/14/2013    Allergies  Allergen Reactions  . Oxycodone-Acetaminophen Anaphylaxis    Swelling and rash  . Codeine   . Benadryl  [Diphenhydramine] Palpitations  . Morphine Itching and Rash  . Ondansetron Diarrhea  . Tylenol [Acetaminophen] Itching and Rash    Past Surgical History:  Procedure Laterality Date  . ABDOMINAL HYSTERECTOMY    . 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 CARDIAC ELECTROPHYSIOLOGY MAPPING AND ABLATION    . CARPAL TUNNEL RELEASE Bilateral   . CHOLECYSTECTOMY  2008  . COLONOSCOPY WITH PROPOFOL N/A 05/07/2017   Procedure: COLONOSCOPY WITH PROPOFOL;  Surgeon: [name], MD;  Location: [facility] ENDOSCOPY;  Service: Gastroenterology;  Laterality: N/A;  . ESOPHAGOGASTRODUODENOSCOPY (EGD) WITH PROPOFOL N/A 05/07/2017   Procedure: ESOPHAGOGASTRODUODENOSCOPY (EGD) WITH PROPOFOL;  Surgeon: [name], MD;  Location: [facility] ENDOSCOPY;  Service: Gastroenterology;  Laterality: N/A;  . FOOT SURGERY Bilateral   . INCONTINENCE SURGERY  2009  . PARTIAL HYSTERECTOMY  03/1996   fibroids  . TONSILLECTOMY  2007    Social History   Tobacco Use  . Smoking status: Former Smoker    Packs/day: 1.00    Years: 20.00    Pack years: 20.00    Types: Cigarettes    Last attempt to quit: 07/02/1991    Years since quitting: 27.3  . Smokeless tobacco: Never Used  Substance Use Topics  . Alcohol use: Yes    Alcohol/week: 2.0 standard drinks    Types: 1 Glasses of wine, 1 Cans of beer per week    Comment: occassional  . Drug use: No     Medication list has been reviewed and updated.  Current Meds  Medication Sig  . allopurinol (ZYLOPRIM) 100 MG tablet TAKE 1 TABLET(100 MG) BY MOUTH DAILY as needed  . aspirin 81 MG chewable tablet Chew 1 tablet (81 mg total) by mouth daily.  . bisoprolol (ZEBETA) 10 MG tablet TAKE 1 TABLET(10 MG) BY MOUTH DAILY  . diphenoxylate-atropine (LOMOTIL) 2.5-0.025 MG tablet Take 2 tablets by mouth daily as needed for diarrhea or loose stools.  . DULoxetine (CYMBALTA) 30 MG capsule TAKE 3 CAPSULES BY MOUTH  DAILY  . fentaNYL (DURAGESIC - DOSED MCG/HR) 25 MCG/HR patch Place 25 mcg onto the  skin every 3 (three) days.   . fexofenadine (ALLEGRA) 180 MG tablet TAKE 1 TABLET(180 MG) BY MOUTH DAILY  . fluticasone (FLONASE) 50 MCG/ACT nasal spray Place 2 sprays into both nostrils daily.  Marland Kitchen glucose blood (ONE TOUCH ULTRA TEST) test strip   . hydrOXYzine (ATARAX/VISTARIL) 10 MG tablet TAKE 1 TABLET(10 MG) BY MOUTH THREE TIMES DAILY AS NEEDED  . lisinopril (PRINIVIL,ZESTRIL) 10 MG tablet Take 10 mg by mouth daily.  . Magnesium Bisglycinate (MAG GLYCINATE) 100 MG TABS Take 100 mg by mouth 2 (two) times daily.  Marland Kitchen MAGNESIUM CL-CALCIUM CARBONATE PO Take 1 tablet by mouth daily.  . Menaquinone-7 (VITAMIN K2) 100 MCG CAPS Take by mouth.  . metFORMIN (GLUCOPHAGE-XR) 500 MG 24 hr tablet TAKE 1 TABLET BY MOUTH  DAILY WITH BREAKFAST (Patient taking differently: Take 500 mg by mouth every evening. )  . Misc Natural Products (OSTEO BI-FLEX ADV TRIPLE ST PO) Take 2 tablets by mouth daily.  . Multiple Vitamins-Minerals (HAIR/SKIN/NAILS/BIOTIN PO) Take 1 capsule by mouth 3 (three) times daily.  . NON FORMULARY   . Omega 3-6-9 Fatty Acids (OMEGA 3-6-9 COMPLEX) CAPS Take by mouth.  Marland Kitchen omeprazole (PRILOSEC) 40 MG capsule Take 1  capsule (40 mg total) by mouth daily.  . pravastatin (PRAVACHOL) 20 MG tablet TAKE 1 TABLET BY MOUTH  DAILY  . promethazine (PHENERGAN) 25 MG tablet TAKE 1 TABLET BY MOUTH TWICE DAILY BEFORE MEALS AS NEEDED  . tiZANidine (ZANAFLEX) 4 MG tablet Take 1 tablet (4 mg total) by mouth every 8 (eight) hours as needed for muscle spasms. (Patient taking differently: Take 8 mg by mouth every 8 (eight) hours as needed for muscle spasms. )  . zaleplon (SONATA) 10 MG capsule TAKE 1 CAPSULE BY MOUTH EVERY DAY AT BEDTIME AS NEEDED FOR SLEEP    PHQ 2/9 Scores 10/28/2018 10/13/2018 02/09/2018 08/06/2017  PHQ - 2 Score 4 0 2 0  PHQ- 9 Score 11 - 10 6    BP Readings from Last 3 Encounters:  10/28/18 135/80  10/20/18 (!) 152/78  09/02/18 120/77    Physical Exam Neurological:     Mental Status: She  is alert and oriented to person, place, and time.  Psychiatric:        Attention and Perception: Attention normal.        Mood and Affect: Mood is depressed. Affect is not tearful or inappropriate.        Speech: Speech normal.        Behavior: Behavior normal.        Thought Content: Thought content does not include suicidal plan.        Cognition and Memory: Cognition normal.        Judgment: Judgment normal.     Wt Readings from Last 3 Encounters:  10/28/18 160 lb (72.6 kg)  10/13/18 160 lb (72.6 kg)  08/20/18 160 lb 13.2 oz (73 kg)    BP 135/80   Pulse 75   Ht 5' 2"  (1.575 m)   Wt 160 lb (72.6 kg)   BMI 29.26 kg/m   Assessment and Plan: 1. Grief reaction Begin low dose valium as needed Precautions against driving are given - diazepam (VALIUM) 2 MG tablet; Take 1 tablet (2 mg total) by mouth every 12 (twelve) hours as needed for anxiety.  Dispense: 60 tablet; Refill: 0  2. Primary insomnia Recommend taking sonata just before bedtime  I spent 8 minutes on this encounter. Partially dictated using Editor, commissioning. Any errors are unintentional.  Halina Maidens, MD [facility]  10/28/2018

## 2018-11-04 ENCOUNTER — Other Ambulatory Visit: Payer: Self-pay

## 2018-11-05 ENCOUNTER — Inpatient Hospital Stay: Payer: Medicare Other | Attending: Hematology and Oncology

## 2018-11-05 ENCOUNTER — Inpatient Hospital Stay: Payer: Medicare Other

## 2018-11-05 DIAGNOSIS — C9001 Multiple myeloma in remission: Secondary | ICD-10-CM

## 2018-11-05 DIAGNOSIS — D696 Thrombocytopenia, unspecified: Secondary | ICD-10-CM

## 2018-11-05 DIAGNOSIS — Z9484 Stem cells transplant status: Secondary | ICD-10-CM

## 2018-11-05 DIAGNOSIS — E538 Deficiency of other specified B group vitamins: Secondary | ICD-10-CM

## 2018-11-05 LAB — MAGNESIUM: Magnesium: 1.1 mg/dL — ABNORMAL LOW (ref 1.7–2.4)

## 2018-11-05 MED ORDER — MAGNESIUM SULFATE 4 GM/100ML IV SOLN
4.0000 g | Freq: Once | INTRAVENOUS | Status: AC
  Administered 2018-11-05: 4 g via INTRAVENOUS
  Filled 2018-11-05: qty 100

## 2018-11-05 MED ORDER — SODIUM CHLORIDE 0.9 % IV SOLN
Freq: Once | INTRAVENOUS | Status: AC
  Administered 2018-11-05: 11:00:00 via INTRAVENOUS
  Filled 2018-11-05: qty 250

## 2018-11-05 NOTE — Patient Instructions (Signed)
Magnesium Sulfate injection What is this medicine? MAGNESIUM SULFATE (mag NEE zee um SUL fate) is an electrolyte injection commonly used to treat low magnesium levels in your blood. It is also used to prevent or control seizures in women with preeclampsia or eclampsia. This medicine may be used for other purposes; ask your health care provider or pharmacist if you have questions. What should I tell my health care provider before I take this medicine? They need to know if you have any of these conditions: -heart disease -history of irregular heart beat -kidney disease -an unusual or allergic reaction to magnesium sulfate, medicines, foods, dyes, or preservatives -pregnant or trying to get pregnant -breast-feeding How should I use this medicine? This medicine is for infusion into a vein. It is given by a health care professional in a hospital or clinic setting. Talk to your pediatrician regarding the use of this medicine in children. While this drug may be prescribed for selected conditions, precautions do apply. Overdosage: If you think you have taken too much of this medicine contact a poison control center or emergency room at once. NOTE: This medicine is only for you. Do not share this medicine with others. What if I miss a dose? This does not apply. What may interact with this medicine? This medicine may interact with the following medications: -certain medicines for anxiety or sleep -certain medicines for seizures like phenobarbital -digoxin -medicines that relax muscles for surgery -narcotic medicines for pain This list may not describe all possible interactions. Give your health care provider a list of all the medicines, herbs, non-prescription drugs, or dietary supplements you use. Also tell them if you smoke, drink alcohol, or use illegal drugs. Some items may interact with your medicine. What should I watch for while using this medicine? Your condition will be monitored carefully  while you are receiving this medicine. You may need blood work done while you are receiving this medicine. What side effects may I notice from receiving this medicine? Side effects that you should report to your doctor or health care professional as soon as possible: -allergic reactions like skin rash, itching or hives, swelling of the face, lips, or tongue -facial flushing -muscle weakness -signs and symptoms of low blood pressure like dizziness; feeling faint or lightheaded, falls; unusually weak or tired -signs and symptoms of a dangerous change in heartbeat or heart rhythm like chest pain; dizziness; fast or irregular heartbeat; palpitations; breathing problems -sweating This list may not describe all possible side effects. Call your doctor for medical advice about side effects. You may report side effects to FDA at 1-800-FDA-1088. Where should I keep my medicine? This drug is given in a hospital or clinic and will not be stored at home. NOTE: This sheet is a summary. It may not cover all possible information. If you have questions about this medicine, talk to your doctor, pharmacist, or health care provider.  2019 [employer] Standard (2016-01-03 12:31:42)   

## 2018-11-18 ENCOUNTER — Other Ambulatory Visit: Payer: Self-pay

## 2018-11-19 ENCOUNTER — Inpatient Hospital Stay: Payer: Medicare Other

## 2018-11-19 ENCOUNTER — Ambulatory Visit
Admission: RE | Admit: 2018-11-19 | Discharge: 2018-11-19 | Disposition: A | Discharge status: Home / Self Care | Payer: Medicare Other | Attending: Urgent Care | Admitting: Urgent Care

## 2018-11-19 ENCOUNTER — Ambulatory Visit
Admission: RE | Admit: 2018-11-19 | Discharge: 2018-11-19 | Disposition: A | Discharge status: Home / Self Care | Payer: Medicare Other | Source: Ambulatory Visit | Attending: Urgent Care | Admitting: Urgent Care

## 2018-11-19 ENCOUNTER — Other Ambulatory Visit: Payer: Self-pay

## 2018-11-19 VITALS — BP 164/72 | HR 83 | Temp 96.5°F | Resp 18

## 2018-11-19 DIAGNOSIS — C9001 Multiple myeloma in remission: Secondary | ICD-10-CM | POA: Diagnosis not present

## 2018-11-19 DIAGNOSIS — C9000 Multiple myeloma not having achieved remission: Secondary | ICD-10-CM | POA: Diagnosis not present

## 2018-11-19 DIAGNOSIS — Z9484 Stem cells transplant status: Secondary | ICD-10-CM

## 2018-11-19 DIAGNOSIS — D696 Thrombocytopenia, unspecified: Secondary | ICD-10-CM

## 2018-11-19 DIAGNOSIS — E538 Deficiency of other specified B group vitamins: Secondary | ICD-10-CM

## 2018-11-19 LAB — MAGNESIUM: Magnesium: 1.2 mg/dL — ABNORMAL LOW (ref 1.7–2.4)

## 2018-11-19 MED ORDER — MAGNESIUM SULFATE 4 GM/100ML IV SOLN
4.0000 g | Freq: Once | INTRAVENOUS | Status: AC
  Administered 2018-11-19: 4 g via INTRAVENOUS
  Filled 2018-11-19: qty 100

## 2018-11-19 MED ORDER — SODIUM CHLORIDE 0.9 % IV SOLN
Freq: Once | INTRAVENOUS | Status: AC
  Administered 2018-11-19: 11:00:00 via INTRAVENOUS
  Filled 2018-11-19: qty 250

## 2018-11-25 ENCOUNTER — Other Ambulatory Visit: Payer: Self-pay

## 2018-11-25 ENCOUNTER — Ambulatory Visit (INDEPENDENT_AMBULATORY_CARE_PROVIDER_SITE_OTHER): Payer: Medicare Other | Admitting: Internal Medicine

## 2018-11-25 ENCOUNTER — Encounter: Payer: Self-pay | Admitting: Internal Medicine

## 2018-11-25 ENCOUNTER — Other Ambulatory Visit: Payer: Self-pay | Admitting: Internal Medicine

## 2018-11-25 VITALS — BP 128/68 | HR 68 | Ht 62.0 in | Wt 162.0 lb

## 2018-11-25 DIAGNOSIS — E118 Type 2 diabetes mellitus with unspecified complications: Secondary | ICD-10-CM

## 2018-11-25 DIAGNOSIS — E1169 Type 2 diabetes mellitus with other specified complication: Secondary | ICD-10-CM | POA: Diagnosis not present

## 2018-11-25 DIAGNOSIS — F324 Major depressive disorder, single episode, in partial remission: Secondary | ICD-10-CM | POA: Diagnosis not present

## 2018-11-25 DIAGNOSIS — M48062 Spinal stenosis, lumbar region with neurogenic claudication: Secondary | ICD-10-CM

## 2018-11-25 DIAGNOSIS — K5901 Slow transit constipation: Secondary | ICD-10-CM | POA: Diagnosis not present

## 2018-11-25 DIAGNOSIS — Z1231 Encounter for screening mammogram for malignant neoplasm of breast: Secondary | ICD-10-CM

## 2018-11-25 DIAGNOSIS — E785 Hyperlipidemia, unspecified: Secondary | ICD-10-CM

## 2018-11-25 DIAGNOSIS — I10 Essential (primary) hypertension: Secondary | ICD-10-CM

## 2018-11-25 LAB — POCT URINALYSIS DIPSTICK
Bilirubin, UA: NEGATIVE
Blood, UA: NEGATIVE
Glucose, UA: NEGATIVE
Ketones, UA: NEGATIVE
Leukocytes, UA: NEGATIVE
Nitrite, UA: NEGATIVE
Protein, UA: POSITIVE — AB
Spec Grav, UA: 1.015 (ref 1.010–1.025)
Urobilinogen, UA: 0.2 E.U./dL
pH, UA: 5 (ref 5.0–8.0)

## 2018-11-25 MED ORDER — FLUOXETINE HCL 20 MG PO CAPS: 20.0000 mg | ORAL_CAPSULE | Freq: Every day | ORAL | 1 refills | Status: DC

## 2018-11-25 MED ORDER — CELECOXIB 100 MG PO CAPS: 100.0000 mg | ORAL_CAPSULE | Freq: Every day | ORAL | 1 refills | Status: DC

## 2018-11-25 NOTE — Progress Notes (Signed)
Date:  11/25/2018   Name:  Sarah [name]   DOB:  11-Feb-1956   MRN:  [id number]   Chief Complaint: Annual Exam (Breast Exam. ); Constipation (Thinks Cymbalta is causing Sarah [name] to be constipated.); and Insomnia (Switched back from [place] to Trazodone.) Sarah [name] is a 63 y.o. female who presents today for Sarah [name] annual medicare exam. Sarah [name] feels fairly well. Sarah [name] reports exercising some. Sarah [name] reports Sarah [name] is sleeping poorly.   Mammogram 05/2017 Colonoscopy 05/2017 Immunizations are up to date  Hypertension  This is a chronic problem. The problem is controlled. Pertinent negatives include no chest pain, headaches, palpitations or shortness of breath. Past treatments include beta blockers and ACE inhibitors. Hypertensive end-organ damage includes kidney disease and heart failure.  Diabetes  Sarah [name] presents for Sarah [name] follow-up diabetic visit. Sarah [name] has type 2 diabetes mellitus. Sarah [name] disease course has been stable. Pertinent negatives for hypoglycemia include no dizziness, headaches, nervousness/anxiousness or tremors. Associated symptoms include fatigue. Pertinent negatives for diabetes include no chest pain, no polydipsia and no polyuria. Current diabetic treatment includes oral agent (monotherapy). Sarah [name] is compliant with treatment all of the time. Sarah [name] weight is stable. Sarah [name] monitors blood glucose at home 1-2 x per day. Sarah [name] breakfast blood glucose is taken between 7-8 am. Sarah [name] breakfast blood glucose range is generally 180-200 mg/dl. An ACE inhibitor/angiotensin II receptor blocker is being taken.  Hyperlipidemia  The problem is controlled. Associated symptoms include myalgias. Pertinent negatives include no chest pain or shortness of breath. Current antihyperlipidemic treatment includes statins. The current treatment provides significant improvement of lipids.  Depression         This is a recurrent problem.The problem is unchanged.  Associated symptoms include fatigue, insomnia, irritable, myalgias and sad.   Associated symptoms include no headaches and no suicidal ideas.     The symptoms are aggravated by family issues (son recently committed suicide).  Past treatments include other medications.  Compliance with treatment is good.  Previous treatment provided moderate (added low dose valium last visit for temporary relief) relief. Cardiomyopathy - followed by Cardiology, Dr. Nehemiah Massed.  Last seen in April, no change in medications or recommendations for testing made. Arthralgias - Sarah [name] has been taking tylenol for hands and back without much benefit.  Sarah [name] also has general joints pains without swelling or injury.  Sarah [name] took Celebrex in the past but stopped it when it was briefly recalled.  Sarah [name] is also taking Cymbalta for joint pains - Sarah [name] does not think it is helping Sarah [name] depression.  Lab Results  Component Value Date   HGBA1C 6.9 (H) 06/11/2018   Lab Results  Component Value Date   CREATININE 1.12 (H) 10/20/2018   BUN 28 (H) 10/20/2018   NA 134 (L) 10/20/2018   K 4.1 10/20/2018   CL 104 10/20/2018   CO2 22 10/20/2018   Lab Results  Component Value Date   CHOL 160 02/09/2018   HDL 72 02/09/2018   LDLCALC 70 02/09/2018   TRIG 91 02/09/2018   CHOLHDL 2.2 02/09/2018   Lab Results  Component Value Date   WBC 6.6 10/20/2018   HGB 12.0 10/20/2018   HCT 34.8 (L) 10/20/2018   MCV 87.2 10/20/2018   PLT 133 (L) 10/20/2018   Lab Results  Component Value Date   TSH 2.105 08/20/2018     Review of Systems  Constitutional: Positive for fatigue. Negative for chills and fever.  HENT: Negative for congestion, hearing loss, tinnitus, trouble swallowing and voice change.   Eyes: Negative for  visual disturbance.  Respiratory: Negative for cough, chest tightness, shortness of breath and wheezing.   Cardiovascular: Negative for chest pain, palpitations and leg swelling.  Gastrointestinal: Positive for constipation. Negative for abdominal pain, diarrhea and vomiting.  Endocrine: Negative for polydipsia  and polyuria.  Genitourinary: Negative for dysuria, frequency, genital sores, vaginal bleeding and vaginal discharge.  Musculoskeletal: Positive for arthralgias, back pain and myalgias. Negative for gait problem and joint swelling.  Skin: Negative for color change and rash.  Allergic/Immunologic: Positive for environmental allergies.  Neurological: Negative for dizziness, tremors, light-headedness and headaches.  Hematological: Negative for adenopathy. Does not bruise/bleed easily.  Psychiatric/Behavioral: Positive for depression, dysphoric mood and sleep disturbance. Negative for suicidal ideas. The patient has insomnia. The patient is not nervous/anxious.     Patient Active Problem List   Diagnosis Date Noted  . Hyperlipidemia associated with type 2 diabetes mellitus ([place]) 11/25/2018  . Normocytic anemia 10/21/2018  . Hypomagnesemia 08/20/2018  . Goals of care, counseling/discussion 08/20/2018  . Low vitamin B12 level 08/20/2018  . Thrombocytopenia ([place]) 02/09/2018  . Benign essential HTN 08/21/2017  . Cardiomyopathy, idiopathic ([place]) 08/21/2017  . Carotid artery stenosis, asymptomatic, bilateral 08/06/2017  . Thyroid nodule 08/06/2017  . Cardiac syncope 07/25/2017  . Iron deficiency anemia due to chronic blood loss 05/13/2017  . Spinal stenosis of lumbar region with neurogenic claudication 04/09/2017  . Magnesium deficiency 04/08/2017  . Bisphosphonate-associated osteonecrosis of the jaw ([place]) 02/25/2017  . LBBB (left bundle branch block) 10/10/2016  . Long term prescription opiate use 09/07/2016  . Chronic pain syndrome 07/08/2016  . Pain medication agreement signed 07/08/2016  . Chronic diastolic CHF (congestive heart failure), NYHA class 2 ([place]) 06/05/2016  . LVH (left ventricular hypertrophy) due to hypertensive disease, with heart failure ([place]) 06/05/2016  . Mild aortic valve stenosis 06/05/2016  . 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[place]) 06/05/2016  . Environmental and seasonal allergies  04/19/2016  . Insomnia 04/19/2016  . Bicuspid aortic valve 04/19/2016  . Asthma 04/19/2016  . Aortic regurgitation 04/19/2016  . Type II diabetes mellitus with complication ([place]) 89/16/9450  . Depression, major, single episode, in partial remission ([place]) 04/12/2016  . Irritable bowel syndrome (IBS) 04/12/2016  . Hepatic steatosis 04/12/2016  . Multiple myeloma in remission ([place]) 04/02/2016  . 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[place]) 07/06/2015  . Colitis 02/14/2013  . Disequilibrium 01/14/2013    Allergies  Allergen Reactions  . Oxycodone-Acetaminophen Anaphylaxis    Swelling and rash  . Codeine   . Benadryl [Diphenhydramine] Palpitations  . Morphine Itching and Rash  . Ondansetron Diarrhea  . Tylenol [Acetaminophen] Itching and Rash    Past Surgical History:  Procedure Laterality Date  . ABDOMINAL HYSTERECTOMY    . 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 CARDIAC ELECTROPHYSIOLOGY MAPPING AND ABLATION    . CARPAL TUNNEL RELEASE Bilateral   . CHOLECYSTECTOMY  2008  . COLONOSCOPY WITH PROPOFOL N/A 05/07/2017   Procedure: COLONOSCOPY WITH PROPOFOL;  Surgeon: [name], MD;  Location: [facility] ENDOSCOPY;  Service: Gastroenterology;  Laterality: N/A;  . ESOPHAGOGASTRODUODENOSCOPY (EGD) WITH PROPOFOL N/A 05/07/2017   Procedure: ESOPHAGOGASTRODUODENOSCOPY (EGD) WITH PROPOFOL;  Surgeon: [name], MD;  Location: [facility] ENDOSCOPY;  Service: Gastroenterology;  Laterality: N/A;  . FOOT SURGERY Bilateral   . INCONTINENCE SURGERY  2009  . PARTIAL HYSTERECTOMY  03/1996   fibroids  . TONSILLECTOMY  2007    Social History   Tobacco Use  . Smoking status: Former Smoker    Packs/day: 1.00    Years:  20.00    Pack years: 20.00    Types: Cigarettes    Last attempt to quit: 07/02/1991    Years since quitting: 27.4  . Smokeless tobacco: Never Used  Substance Use Topics  . Alcohol use: Yes    Alcohol/week: 2.0 standard drinks    Types: 1 Glasses of wine, 1 Cans of beer per week     Comment: occassional  . Drug use: No     Medication list has been reviewed and updated.  Current Meds  Medication Sig  . allopurinol (ZYLOPRIM) 100 MG tablet TAKE 1 TABLET(100 MG) BY MOUTH DAILY as needed  . aspirin 81 MG chewable tablet Chew 1 tablet (81 mg total) by mouth daily.  . bisoprolol (ZEBETA) 10 MG tablet TAKE 1 TABLET(10 MG) BY MOUTH DAILY  . diazepam (VALIUM) 2 MG tablet Take 1 tablet (2 mg total) by mouth every 12 (twelve) hours as needed for anxiety.  . diphenoxylate-atropine (LOMOTIL) 2.5-0.025 MG tablet Take 2 tablets by mouth daily as needed for diarrhea or loose stools.  . DULoxetine (CYMBALTA) 30 MG capsule TAKE 3 CAPSULES BY MOUTH  DAILY  . fentaNYL (DURAGESIC - DOSED MCG/HR) 25 MCG/HR patch Place 25 mcg onto the skin every 3 (three) days.   . fexofenadine (ALLEGRA) 180 MG tablet TAKE 1 TABLET(180 MG) BY MOUTH DAILY  . fluticasone (FLONASE) 50 MCG/ACT nasal spray Place 2 sprays into both nostrils daily.  Marland Kitchen glucose blood (ONE TOUCH ULTRA TEST) test strip   . hydrOXYzine (ATARAX/VISTARIL) 10 MG tablet TAKE 1 TABLET(10 MG) BY MOUTH THREE TIMES DAILY AS NEEDED  . lisinopril (PRINIVIL,ZESTRIL) 10 MG tablet Take 10 mg by mouth daily.  . Magnesium Bisglycinate (MAG GLYCINATE) 100 MG TABS Take 100 mg by mouth 2 (two) times daily.  Marland Kitchen MAGNESIUM CL-CALCIUM CARBONATE PO Take 1 tablet by mouth daily.  . Menaquinone-7 (VITAMIN K2) 100 MCG CAPS Take by mouth.  . metFORMIN (GLUCOPHAGE-XR) 500 MG 24 hr tablet TAKE 1 TABLET BY MOUTH  DAILY WITH BREAKFAST (Patient taking differently: Take 500 mg by mouth every evening. )  . Misc Natural Products (OSTEO BI-FLEX ADV TRIPLE ST PO) Take 2 tablets by mouth daily.  . Multiple Vitamins-Minerals (HAIR/SKIN/NAILS/BIOTIN PO) Take 1 capsule by mouth 3 (three) times daily.  . NON FORMULARY   . Omega 3-6-9 Fatty Acids (OMEGA 3-6-9 COMPLEX) CAPS Take by mouth.  Marland Kitchen omeprazole (PRILOSEC) 40 MG capsule Take 1 capsule (40 mg total) by mouth daily.  .  pravastatin (PRAVACHOL) 20 MG tablet TAKE 1 TABLET BY MOUTH  DAILY  . promethazine (PHENERGAN) 25 MG tablet TAKE 1 TABLET BY MOUTH TWICE DAILY BEFORE MEALS AS NEEDED  . tiZANidine (ZANAFLEX) 4 MG tablet Take 1 tablet (4 mg total) by mouth every 8 (eight) hours as needed for muscle spasms. (Patient taking differently: Take 8 mg by mouth every 8 (eight) hours as needed for muscle spasms. )  . traZODone (DESYREL) 100 MG tablet Take 100 mg by mouth at bedtime. For sleep.    PHQ 2/9 Scores 11/25/2018 10/28/2018 10/13/2018 02/09/2018  PHQ - 2 Score 6 4 0 2  PHQ- 9 Score 18 11 - 10    BP Readings from Last 3 Encounters:  11/25/18 128/68  11/19/18 (!) 164/72  10/28/18 135/80    Physical Exam Vitals signs and nursing note reviewed.  Constitutional:      General: Sarah [name] is irritable. Sarah [name] is not in acute distress.    Appearance: Sarah [name] is well-developed.  HENT:  Head: Normocephalic and atraumatic.     Right Ear: Tympanic membrane and ear canal normal.     Left Ear: Tympanic membrane and ear canal normal.     Nose:     Right Sinus: No maxillary sinus tenderness.     Left Sinus: No maxillary sinus tenderness.     Mouth/Throat:     Pharynx: Uvula midline.  Eyes:     General: No scleral icterus.       Right eye: No discharge.        Left eye: No discharge.     Conjunctiva/sclera: Conjunctivae normal.  Neck:     Musculoskeletal: Normal range of motion. No erythema.     Thyroid: No thyromegaly.     Vascular: No carotid bruit.  Cardiovascular:     Rate and Rhythm: Normal rate and regular rhythm.     Pulses: Normal pulses.     Heart sounds: Normal heart sounds.  Pulmonary:     Effort: Pulmonary effort is normal. No respiratory distress.     Breath sounds: No wheezing.  Chest:     Breasts:        Right: No mass, nipple discharge, skin change or tenderness.        Left: No mass, nipple discharge, skin change or tenderness.  Abdominal:     General: Bowel sounds are normal.     Palpations:  Abdomen is soft.     Tenderness: There is no abdominal tenderness.  Musculoskeletal: Normal range of motion.     Right shoulder: Normal.     Left shoulder: Normal.     Right wrist: Sarah [name] exhibits no swelling and no effusion.     Left wrist: Sarah [name] exhibits no swelling and no effusion.     Right lower leg: No edema.     Left lower leg: No edema.  Lymphadenopathy:     Cervical: No cervical adenopathy.  Skin:    General: Skin is warm and dry.     Findings: No rash.  Neurological:     Mental Status: Sarah [name] is alert and oriented to person, place, and time.     Cranial Nerves: No cranial nerve deficit.     Sensory: No sensory deficit.     Deep Tendon Reflexes: Reflexes are normal and symmetric.  Psychiatric:        Attention and Perception: Attention normal.        Mood and Affect: Mood normal.        Speech: Speech normal.        Behavior: Behavior normal.        Thought Content: Thought content normal.     Wt Readings from Last 3 Encounters:  11/25/18 162 lb (73.5 kg)  10/28/18 160 lb (72.6 kg)  10/13/18 160 lb (72.6 kg)    BP 128/68   Pulse 68   Ht 5' 2"  (1.575 m)   Wt 162 lb (73.5 kg)   SpO2 97%   BMI 29.63 kg/m   Assessment and Plan: 1. Depression, major, single episode, in partial remission ([place]) Will change cymbalta to Prozac Follow up in one month - FLUoxetine (PROZAC) 20 MG capsule; Take 1 capsule (20 mg total) by mouth daily.  Dispense: 30 capsule; Refill: 1  2. Benign essential HTN controlled  3. Spinal stenosis of lumbar region with neurogenic claudication Will start low dose celebrex, monitor for GI side effects - celecoxib (CELEBREX) 100 MG capsule; Take 1 capsule (100 mg total) by mouth daily.  Dispense: 30 capsule;  Refill: 1  4. Type II diabetes mellitus with complication (HCC) Glucoses have been higher, may be due to recent cortisone injections If needed, will add medication - Hemoglobin A1c - POCT Urinalysis Dipstick  5. Hyperlipidemia associated with  type 2 diabetes mellitus ([place]) On statin therapy - Lipid panel  6. Encounter for screening mammogram for breast cancer To be scheduled - MM 3D SCREEN BREAST BILATERAL; Future  7. Slow transit constipation Begin colace daily if stopping Cymbalta does not resolve the isses   Partially dictated using Editor, commissioning. Any errors are unintentional.  Halina Maidens, MD [facility]  11/25/2018

## 2018-11-26 LAB — LIPID PANEL
Chol/HDL Ratio: 2.1 ratio (ref 0.0–4.4)
Cholesterol, Total: 131 mg/dL (ref 100–199)
HDL: 63 mg/dL (ref >39)
LDL Calculated: 52 mg/dL (ref 0–99)
Triglycerides: 79 mg/dL (ref 0–149)
VLDL Cholesterol Cal: 16 mg/dL (ref 5–40)

## 2018-11-26 LAB — HEMOGLOBIN A1C
Est. average glucose Bld gHb Est-mCnc: 166 mg/dL
Hgb A1c MFr Bld: 7.4 % — ABNORMAL HIGH (ref 4.8–5.6)

## 2018-12-02 ENCOUNTER — Other Ambulatory Visit: Payer: Self-pay

## 2018-12-02 DIAGNOSIS — E1122 Type 2 diabetes mellitus with diabetic chronic kidney disease: Secondary | ICD-10-CM

## 2018-12-02 DIAGNOSIS — I6523 Occlusion and stenosis of bilateral carotid arteries: Secondary | ICD-10-CM

## 2018-12-02 DIAGNOSIS — K219 Gastro-esophageal reflux disease without esophagitis: Secondary | ICD-10-CM

## 2018-12-02 MED ORDER — ALLOPURINOL 100 MG PO TABS: ORAL_TABLET | ORAL | 1 refills | Status: AC

## 2018-12-02 MED ORDER — OMEPRAZOLE 40 MG PO CPDR: 40.0000 mg | DELAYED_RELEASE_CAPSULE | Freq: Every day | ORAL | 1 refills | Status: DC

## 2018-12-02 MED ORDER — METFORMIN HCL ER 500 MG PO TB24: 500.0000 mg | ORAL_TABLET | Freq: Every day | ORAL | 1 refills | Status: DC

## 2018-12-02 MED ORDER — TIZANIDINE HCL 4 MG PO TABS: 4.0000 mg | ORAL_TABLET | Freq: Three times a day (TID) | ORAL | 1 refills | Status: DC | PRN

## 2018-12-02 MED ORDER — PRAVASTATIN SODIUM 20 MG PO TABS: ORAL_TABLET | ORAL | 1 refills | Status: DC

## 2018-12-02 NOTE — Progress Notes (Signed)
Received RF requests for patients medication. Sent in medication to [employer] as [facility] requested through [place].

## 2018-12-03 ENCOUNTER — Other Ambulatory Visit: Payer: Self-pay

## 2018-12-03 ENCOUNTER — Other Ambulatory Visit: Payer: Self-pay | Admitting: Hematology and Oncology

## 2018-12-03 ENCOUNTER — Inpatient Hospital Stay: Payer: Medicare PPO

## 2018-12-03 ENCOUNTER — Inpatient Hospital Stay: Payer: Medicare PPO | Attending: Hematology and Oncology

## 2018-12-03 VITALS — BP 138/72 | HR 88 | Temp 95.0°F | Resp 18

## 2018-12-03 DIAGNOSIS — E538 Deficiency of other specified B group vitamins: Secondary | ICD-10-CM | POA: Insufficient documentation

## 2018-12-03 DIAGNOSIS — Z87891 Personal history of nicotine dependence: Secondary | ICD-10-CM | POA: Insufficient documentation

## 2018-12-03 DIAGNOSIS — K219 Gastro-esophageal reflux disease without esophagitis: Secondary | ICD-10-CM | POA: Diagnosis not present

## 2018-12-03 DIAGNOSIS — Z803 Family history of malignant neoplasm of breast: Secondary | ICD-10-CM | POA: Diagnosis not present

## 2018-12-03 DIAGNOSIS — Z833 Family history of diabetes mellitus: Secondary | ICD-10-CM | POA: Insufficient documentation

## 2018-12-03 DIAGNOSIS — Z8249 Family history of ischemic heart disease and other diseases of the circulatory system: Secondary | ICD-10-CM | POA: Diagnosis not present

## 2018-12-03 DIAGNOSIS — R252 Cramp and spasm: Secondary | ICD-10-CM | POA: Diagnosis not present

## 2018-12-03 DIAGNOSIS — Z791 Long term (current) use of non-steroidal anti-inflammatories (NSAID): Secondary | ICD-10-CM | POA: Insufficient documentation

## 2018-12-03 DIAGNOSIS — K5900 Constipation, unspecified: Secondary | ICD-10-CM | POA: Diagnosis not present

## 2018-12-03 DIAGNOSIS — E1140 Type 2 diabetes mellitus with diabetic neuropathy, unspecified: Secondary | ICD-10-CM | POA: Insufficient documentation

## 2018-12-03 DIAGNOSIS — D631 Anemia in chronic kidney disease: Secondary | ICD-10-CM | POA: Insufficient documentation

## 2018-12-03 DIAGNOSIS — R110 Nausea: Secondary | ICD-10-CM | POA: Diagnosis not present

## 2018-12-03 DIAGNOSIS — R5383 Other fatigue: Secondary | ICD-10-CM | POA: Diagnosis not present

## 2018-12-03 DIAGNOSIS — Z7984 Long term (current) use of oral hypoglycemic drugs: Secondary | ICD-10-CM | POA: Insufficient documentation

## 2018-12-03 DIAGNOSIS — M546 Pain in thoracic spine: Secondary | ICD-10-CM | POA: Diagnosis not present

## 2018-12-03 DIAGNOSIS — E1122 Type 2 diabetes mellitus with diabetic chronic kidney disease: Secondary | ICD-10-CM | POA: Insufficient documentation

## 2018-12-03 DIAGNOSIS — Z9484 Stem cells transplant status: Secondary | ICD-10-CM

## 2018-12-03 DIAGNOSIS — Z7982 Long term (current) use of aspirin: Secondary | ICD-10-CM | POA: Diagnosis not present

## 2018-12-03 DIAGNOSIS — N183 Chronic kidney disease, stage 3 (moderate): Secondary | ICD-10-CM | POA: Insufficient documentation

## 2018-12-03 DIAGNOSIS — R197 Diarrhea, unspecified: Secondary | ICD-10-CM | POA: Diagnosis not present

## 2018-12-03 DIAGNOSIS — C9001 Multiple myeloma in remission: Secondary | ICD-10-CM

## 2018-12-03 DIAGNOSIS — Z79899 Other long term (current) drug therapy: Secondary | ICD-10-CM | POA: Insufficient documentation

## 2018-12-03 DIAGNOSIS — I130 Hypertensive heart and chronic kidney disease with heart failure and stage 1 through stage 4 chronic kidney disease, or unspecified chronic kidney disease: Secondary | ICD-10-CM | POA: Insufficient documentation

## 2018-12-03 DIAGNOSIS — R112 Nausea with vomiting, unspecified: Secondary | ICD-10-CM

## 2018-12-03 DIAGNOSIS — G8929 Other chronic pain: Secondary | ICD-10-CM | POA: Diagnosis not present

## 2018-12-03 DIAGNOSIS — D696 Thrombocytopenia, unspecified: Secondary | ICD-10-CM

## 2018-12-03 LAB — MAGNESIUM: Magnesium: 1.3 mg/dL — ABNORMAL LOW (ref 1.7–2.4)

## 2018-12-03 MED ORDER — SODIUM CHLORIDE 0.9 % IV SOLN
Freq: Once | INTRAVENOUS | Status: AC
  Administered 2018-12-03: 10:00:00 via INTRAVENOUS
  Filled 2018-12-03: qty 250

## 2018-12-03 MED ORDER — MAGNESIUM SULFATE 4 GM/100ML IV SOLN
4.0000 g | Freq: Once | INTRAVENOUS | Status: AC
  Administered 2018-12-03: 4 g via INTRAVENOUS

## 2018-12-03 MED ORDER — PROMETHAZINE HCL 25 MG PO TABS: ORAL_TABLET | ORAL | 0 refills | Status: DC

## 2018-12-03 NOTE — Patient Instructions (Signed)
Hypomagnesemia  Hypomagnesemia is a condition in which the level of magnesium in the blood is low. Magnesium is a mineral that is found in many foods. It is used in many different processes in the body. Hypomagnesemia can affect every organ in the body. In severe cases, it can cause life-threatening problems.  What are the causes?  This condition may be caused by:   Not getting enough magnesium in your diet.   Malnutrition.   Problems with absorbing magnesium from the intestines.   Dehydration.   Alcohol abuse.   Vomiting.   Severe or chronic diarrhea.   Some medicines, including medicines that make you urinate more (diuretics).   Certain diseases, such as kidney disease, diabetes, celiac disease, and overactive thyroid.  What are the signs or symptoms?  Symptoms of this condition include:   Loss of appetite.   Nausea and vomiting.   Involuntary shaking or trembling of a body part (tremor).   Muscle weakness.   Tingling in the arms and legs.   Sudden tightening of muscles (muscle spasms).   Confusion.   Psychiatric issues, such as depression, irritability, or psychosis.   A feeling of fluttering of the heart.   Seizures.  These symptoms are more severe if magnesium levels drop suddenly.  How is this diagnosed?  This condition may be diagnosed based on:   Your symptoms and medical history.   A physical exam.   Blood and urine tests.  How is this treated?  Treatment depends on the cause and the severity of the condition. It may be treated with:   A magnesium supplement. This can be taken in pill form. If the condition is severe, magnesium is usually given through an IV.   Changes to your diet. You may be directed to eat foods that have a lot of magnesium, such as green leafy vegetables, peas, beans, and nuts.   Stopping any intake of alcohol.  Follow these instructions at home:          Make sure that your diet includes foods with magnesium. Foods that have a lot of magnesium in them  include:  ? Green leafy vegetables, such as spinach and broccoli.  ? Beans and peas.  ? Nuts and seeds, such as almonds and sunflower seeds.  ? Whole grains, such as whole grain bread and fortified cereals.   Take magnesium supplements if your health care provider tells you to do that. Take them as directed.   Take over-the-counter and prescription medicines only as told by your health care provider.   Have your magnesium levels monitored as told by your health care provider.   When you are active, drink fluids that contain electrolytes.   Avoid drinking alcohol.   Keep all follow-up visits as told by your health care provider. This is important.  Contact a health care provider if:   You get worse instead of better.   Your symptoms return.  Get help right away if you:   Develop severe muscle weakness.   Have trouble breathing.   Feel that your heart is racing.  Summary   Hypomagnesemia is a condition in which the level of magnesium in the blood is low.   Hypomagnesemia can affect every organ in the body.   Treatment may include eating more foods that contain magnesium, taking magnesium supplements, and not drinking alcohol.   Have your magnesium levels monitored as told by your health care provider.  This information is not intended to replace advice given   to you by your health care provider. Make sure you discuss any questions you have with your health care provider.  Document Released: 03/13/2005 Document Revised: 05/19/2017 Document Reviewed: 05/19/2017  Elsevier Interactive Patient Education  2019 [employer]

## 2018-12-04 ENCOUNTER — Other Ambulatory Visit: Payer: Self-pay | Admitting: Otolaryngology

## 2018-12-04 DIAGNOSIS — E041 Nontoxic single thyroid nodule: Secondary | ICD-10-CM

## 2018-12-08 ENCOUNTER — Telehealth: Payer: Self-pay | Admitting: Hematology and Oncology

## 2018-12-08 NOTE — Telephone Encounter (Signed)
Called pt for appt pre-screen but got no answer. Left vm msg about screening process and new guidelines about mask req, no visitors, screening questions they will be asked, and fever checks

## 2018-12-09 ENCOUNTER — Ambulatory Visit
Admission: RE | Admit: 2018-12-09 | Discharge: 2018-12-09 | Disposition: A | Discharge status: Home / Self Care | Payer: Medicare PPO | Source: Ambulatory Visit | Attending: Otolaryngology | Admitting: Otolaryngology

## 2018-12-09 ENCOUNTER — Other Ambulatory Visit: Payer: Self-pay

## 2018-12-09 ENCOUNTER — Other Ambulatory Visit: Payer: Medicare PPO

## 2018-12-09 DIAGNOSIS — E041 Nontoxic single thyroid nodule: Secondary | ICD-10-CM

## 2018-12-10 ENCOUNTER — Other Ambulatory Visit: Payer: Medicare Other

## 2018-12-11 ENCOUNTER — Other Ambulatory Visit: Payer: Self-pay | Admitting: Internal Medicine

## 2018-12-11 DIAGNOSIS — F5101 Primary insomnia: Secondary | ICD-10-CM

## 2018-12-11 DIAGNOSIS — K219 Gastro-esophageal reflux disease without esophagitis: Secondary | ICD-10-CM

## 2018-12-11 MED ORDER — OMEPRAZOLE 40 MG PO CPDR: 40.0000 mg | DELAYED_RELEASE_CAPSULE | Freq: Every day | ORAL | 1 refills | Status: DC

## 2018-12-11 MED ORDER — TRAZODONE HCL 100 MG PO TABS: 100.0000 mg | ORAL_TABLET | Freq: Every day | ORAL | 1 refills | Status: DC

## 2018-12-14 ENCOUNTER — Encounter: Payer: Self-pay | Admitting: Hematology and Oncology

## 2018-12-16 ENCOUNTER — Other Ambulatory Visit: Payer: Self-pay

## 2018-12-16 NOTE — Progress Notes (Signed)
[facility]  [address] [place], [place] [address] Phone: [phone]  Fax: [phone]   Clinic Day:  12/17/2018  Referring physician: Glean Hess, MD  Chief Complaint: Sarah [name] is a 63 y.o. 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  HPI: The patient was last seen in the medical oncology clinic on 10/21/2018. At that time, she remained fatigued.  She had chronic upper back pain secondary to spinal stenosis. She had chronic nausea s/p gallbladder surgery.  She saw her PCP Halina Maidens, MD on 10/28/2018. She was started on Valium following the death of her son on 2018/11/13. She continued on sonata for insomnia. On 11/25/2018 she was switched to Prozac and started on Celebrex for spinal stenosis.  She was seen in pain medicine by Josephina Gip, MD at [facility] on 10/21/2018 and 12/09/2018. She continued on Cymbalta, tizanidine, and duragesic.   Bone survey on 11/19/2018 revealed no suspicious lucent lesions and no acute bony abnormality.  Thyroid ultrasound on 12/09/2018 revealed no new findings. There was a stable 1.7 cm right mid thyroid TR 4 nodule previously biopsied. There was stable thyroid heterogeneity with numerous small cysts and nodules all less than 1 cm in size.  Magnesium was 1.1 on 11/05/2018, 1.2 on 11/19/2018, and 1.3 on 12/03/2018.  She received magnesium 4 gm IV on each occasion.   [facility] labs on 12/09/2018 revealed a WBC 5,100, ANC 3,500, hemoglobin 11.2, hematocrit 33.4, platelets 147,000. Creatinine 1.16. AST 49, ALT 56. M-spike was 0.  Kappa free light chain 15.4, lambda free light chains 160.7, and ratio 0.10 (0.26-1.65).   During the interim, the patients feels "nauseated."  Symptoms have been present since her cholecystectomy. She states her smell is heightened and her taste has changed. She reports being constipated for 12 to  14 days, followed by diarrhea for 7 days. She is taking stool softeners and they are not helping with her constipation.  She denies any fever, sweats, and infections. She continues to have pinched nerve pain, bone pain, and leg cramps. She notes her forehead has a yellowish tint. She has a history of fatty liver disease.   Her weight is down 1 lb. She is watching her sugar intake and she continues on Metformin.    Past Medical History:  Diagnosis Date  . Abnormal stress test 02/14/2016   Overview:  Added automatically from request for surgery [id number]  . Anemia   . Anxiety   . Arthritis   . Bicuspid aortic valve   . CHF (congestive heart failure) ([place])   . CKD (chronic kidney disease) stage 3, GFR 30-59 ml/min (HCC)   . Depression   . Diabetes mellitus ([place])   . Dizziness   . Fatty liver   . Frequent falls   . GERD (gastroesophageal reflux disease)   . Gout   . Heart murmur   . History of blood transfusion   . History of bone marrow transplant ([place])   . History of uterine fibroid   . Hx of cardiac catheterization 06/05/2016   Overview:  Normal coronaries 2017  . Hypertension   . Hypomagnesemia   . Multiple myeloma ([place])   . Personal history of chemotherapy   . Renal cyst     Past Surgical History:  Procedure Laterality Date  . ABDOMINAL HYSTERECTOMY    . 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 CARDIAC ELECTROPHYSIOLOGY MAPPING AND ABLATION    .  CARPAL TUNNEL RELEASE Bilateral   . CHOLECYSTECTOMY  2008  . COLONOSCOPY WITH PROPOFOL N/A 05/07/2017   Procedure: COLONOSCOPY WITH PROPOFOL;  Surgeon: [name], MD;  Location: [facility] ENDOSCOPY;  Service: Gastroenterology;  Laterality: N/A;  . ESOPHAGOGASTRODUODENOSCOPY (EGD) WITH PROPOFOL N/A 05/07/2017   Procedure: ESOPHAGOGASTRODUODENOSCOPY (EGD) WITH PROPOFOL;  Surgeon: [name], MD;  Location: [facility] ENDOSCOPY;  Service: Gastroenterology;  Laterality: N/A;  . FOOT SURGERY Bilateral   . INCONTINENCE SURGERY  2009  . PARTIAL  HYSTERECTOMY  03/1996   fibroids  . TONSILLECTOMY  2007    Family History  Problem Relation Age of Onset  . Colon cancer Father   . Renal Disease Father   . Diabetes Mellitus II Father   . Melanoma Paternal Grandmother   . Breast cancer Maternal Aunt 74  . Anemia Mother   . Heart disease Mother   . Heart failure Mother   . Renal Disease Mother   . Congestive Heart Failure Mother   . Heart disease Maternal Uncle   . Throat cancer Maternal Uncle   . Lung cancer Maternal Uncle   . Liver disease Maternal Uncle   . Heart failure Maternal Uncle   . Hearing loss Son 61       Suicide     Social History:  reports that she quit smoking about 27 years ago. Her smoking use included cigarettes. She has a 20.00 pack-year smoking history. She has never used smokeless tobacco. She reports current alcohol use of about 2.0 standard drinks of alcohol per week. She reports that she does not use drugs.   Patient has not had ETOH in several months. She is on disability. She notes exposure to perchloroethylene [facility]). The patient is alone today.  Allergies:  Allergies  Allergen Reactions  . Oxycodone-Acetaminophen Anaphylaxis    Swelling and rash  . Codeine   . Benadryl [Diphenhydramine] Palpitations  . Morphine Itching and Rash  . Ondansetron Diarrhea  . Tylenol [Acetaminophen] Itching and Rash    Current Medications: Current Outpatient Medications  Medication Sig Dispense Refill  . allopurinol (ZYLOPRIM) 100 MG tablet TAKE 1 TABLET(100 MG) BY MOUTH DAILY as needed 90 tablet 1  . aspirin 81 MG chewable tablet Chew 1 tablet (81 mg total) by mouth daily. 30 tablet 0  . bisoprolol (ZEBETA) 10 MG tablet TAKE 1 TABLET(10 MG) BY MOUTH DAILY 90 tablet 0  . celecoxib (CELEBREX) 100 MG capsule TAKE 1 CAPSULE(100 MG) BY MOUTH DAILY 90 capsule 0  . diazepam (VALIUM) 2 MG tablet Take 1 tablet (2 mg total) by mouth every 12 (twelve) hours as needed for anxiety. 60 tablet 0  . diphenoxylate-atropine  (LOMOTIL) 2.5-0.025 MG tablet Take 2 tablets by mouth daily as needed for diarrhea or loose stools. 45 tablet 2  . fentaNYL (DURAGESIC - DOSED MCG/HR) 25 MCG/HR patch Place 25 mcg onto the skin every 3 (three) days.     . fexofenadine (ALLEGRA) 180 MG tablet TAKE 1 TABLET(180 MG) BY MOUTH DAILY (Patient taking differently: as needed. ) 30 tablet 5  . FLUoxetine (PROZAC) 20 MG capsule Take 1 capsule (20 mg total) by mouth daily. 30 capsule 1  . fluticasone (FLONASE) 50 MCG/ACT nasal spray Place 2 sprays into both nostrils daily. (Patient taking differently: Place 2 sprays into both nostrils as needed. ) 16 g 2  . furosemide (LASIX) 20 MG tablet Take 20 mg by mouth as needed.     . hydrOXYzine (ATARAX/VISTARIL) 10 MG tablet TAKE 1 TABLET(10 MG) BY MOUTH  THREE TIMES DAILY AS NEEDED 90 tablet 0  . lisinopril (PRINIVIL,ZESTRIL) 10 MG tablet Take 10 mg by mouth daily.    . Magnesium Bisglycinate (MAG GLYCINATE) 100 MG TABS Take 100 mg by mouth 2 (two) times daily. 60 tablet 0  . MAGNESIUM CL-CALCIUM CARBONATE PO Take 1 tablet by mouth daily.    . Menaquinone-7 (VITAMIN K2) 100 MCG CAPS Take 1 tablet by mouth daily.     . metFORMIN (GLUCOPHAGE-XR) 500 MG 24 hr tablet Take 1 tablet (500 mg total) by mouth daily with breakfast. 90 tablet 1  . Misc Natural Products (OSTEO BI-FLEX ADV TRIPLE ST PO) Take 2 tablets by mouth daily.    . Multiple Vitamins-Minerals (HAIR/SKIN/NAILS/BIOTIN PO) Take 1 capsule by mouth 3 (three) times daily.    . NON FORMULARY     . omeprazole (PRILOSEC) 40 MG capsule Take 1 capsule (40 mg total) by mouth daily. 90 capsule 1  . pravastatin (PRAVACHOL) 20 MG tablet TAKE 1 TABLET BY MOUTH  DAILY 90 tablet 1  . promethazine (PHENERGAN) 25 MG tablet TAKE 1 TABLET BY MOUTH TWICE DAILY BEFORE MEALS AS NEEDED 60 tablet 0  . tiZANidine (ZANAFLEX) 4 MG tablet Take 1 tablet (4 mg total) by mouth every 8 (eight) hours as needed for muscle spasms. 270 tablet 1  . traZODone (DESYREL) 100 MG  tablet Take 1 tablet (100 mg total) by mouth at bedtime. For sleep. 90 tablet 1  . glucose blood (ONE TOUCH ULTRA TEST) test strip     . Omega 3-6-9 Fatty Acids (OMEGA 3-6-9 COMPLEX) CAPS Take by mouth.     No current facility-administered medications for this visit.     Review of Systems  Constitutional: Positive for malaise/fatigue and weight loss (down 1 lb.). Negative for chills, diaphoresis and fever.       Feels "nauseated".    HENT: Negative.  Negative for congestion, hearing loss, sinus pain and sore throat.   Eyes: Negative.  Negative for blurred vision.  Respiratory: Negative.  Negative for cough, shortness of breath and wheezing.   Cardiovascular: Negative.  Negative for chest pain, palpitations, orthopnea, leg swelling and PND.  Gastrointestinal: Positive for constipation, diarrhea and nausea (chronic s/p gallbladder surgery). Negative for abdominal pain, blood in stool, melena and vomiting.  Genitourinary: Negative for dysuria, frequency, hematuria and urgency.       Bladder leakage (f/u urology bladder sling).  Musculoskeletal: Positive for back pain (chronic, spinal stenosis and pinched nerve), joint pain (arthritits ), myalgias (leg cramps, improved) and neck pain (chronic, spinal stenosis and pinched nerve).  Skin: Negative.  Negative for rash.  Neurological: Negative.  Negative for dizziness, tingling, sensory change, weakness and headaches.  Endo/Heme/Allergies: Does not bruise/bleed easily.       Diabetes.   Psychiatric/Behavioral: Negative.  Negative for depression and memory loss. The patient is not nervous/anxious and does not have insomnia.   All other systems reviewed and are negative.  Performance status (ECOG): 1  Vitals: Blood pressure (!) 141/76, pulse 65, temperature 97.7 F (36.5 C), temperature source Oral, resp. rate 16, weight 159 lb 15.1 oz (72.6 kg), SpO2 99 %.   Physical Exam  Constitutional: She is oriented to person, place, and time. She appears  well-developed and well-nourished. No distress.  HENT:  Head: Normocephalic and atraumatic.  Curly brown hair.  Dry mouth.  Mask.  Eyes: Pupils are equal, round, and reactive to light. Conjunctivae and EOM are normal. No scleral icterus.  Glasses.  Neck: Normal range  of motion. Neck supple. No JVD present.  Cardiovascular: Normal rate, regular rhythm and normal heart sounds.  No murmur heard. Pulmonary/Chest: Effort normal and breath sounds normal. No respiratory distress. She has no wheezes.  Abdominal: Soft. Bowel sounds are normal. She exhibits no distension and no mass (small scar tissue deposits at site of previous lovenox injections). There is no abdominal tenderness. There is no rebound and no guarding.  Musculoskeletal: Normal range of motion.        General: Tenderness (in legs s/p climbing ladder) present. No edema.  Lymphadenopathy:    She has no cervical adenopathy.    She has no axillary adenopathy.       Right: No supraclavicular adenopathy present.       Left: No supraclavicular adenopathy present.  Neurological: She is alert and oriented to person, place, and time.  Skin: Skin is warm and dry. No rash noted. She is not diaphoretic. No erythema.  Psychiatric: She has a normal mood and affect. Her behavior is normal. Judgment and thought content normal.  Nursing note and vitals reviewed.   Infusion on 12/17/2018  Component Date Value Ref Range Status  . Magnesium 12/17/2018 1.1* 1.7 - 2.4 mg/dL Final   Performed at [facility], [address], [place], [place] [address]  . Sodium 12/17/2018 137  135 - 145 mmol/L Final  . Potassium 12/17/2018 3.9  3.5 - 5.1 mmol/L Final  . Chloride 12/17/2018 105  98 - 111 mmol/L Final  . CO2 12/17/2018 22  22 - 32 mmol/L Final  . Glucose, Bld 12/17/2018 144* 70 - 99 mg/dL Final  . BUN 12/17/2018 15  8 - 23 mg/dL Final  . Creatinine, Ser 12/17/2018 1.07* 0.44 - 1.00 mg/dL Final  . Calcium 12/17/2018 8.9  8.9 - 10.3 mg/dL  Final  . Total Protein 12/17/2018 7.3  6.5 - 8.1 g/dL Final  . Albumin 12/17/2018 4.4  3.5 - 5.0 g/dL Final  . AST 12/17/2018 49* 15 - 41 U/L Final  . ALT 12/17/2018 53* 0 - 44 U/L Final  . Alkaline Phosphatase 12/17/2018 80  38 - 126 U/L Final  . Total Bilirubin 12/17/2018 0.6  0.3 - 1.2 mg/dL Final  . GFR calc non Af Amer 12/17/2018 56* >60 mL/min Final  . GFR calc Af Amer 12/17/2018 >60  >60 mL/min Final  . Anion gap 12/17/2018 10  5 - 15 Final   Performed at [facility], [address] [place], [place] [address]    Assessment:  Raevyn Sokol is a 63 y.o. 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  Bone marrow revealed 80% plasma cells.  She initially underwent induction with RVD.  Revlimid maintenance was discontinued on 01/21/2017 secondary to intolerance.    M-spike has been followed:  0 on 04/02/2016 - 10/20/2018; and 0.2 on 09/02/2017.  Lambda light chains have been followed: 22.2 (ratio 0.56) on 07/03/2017, 30.8 (ratio 0.78) on 09/02/2017, 36.9 (ratio 0.40) on 10/21/2017, 37.4 (ratio 0.41) on 12/16/2017, 70.7(ratio 0.31)  on 02/17/2018, 64.2 (ratio 0.27) on 04/07/2018, 78.9 (ratio 0.18) on 05/26/2018, 128.8 (ratio 0.17) on 08/06/2018, 181.5 (ratio 0.13) on 10/08/2018, 130.9 (ratio 0.13) on 10/20/2018, and 160.7 (ratio 0.10) on 12/09/2018.  Bone survey on 04/08/2016 and 05/28/2017 revealed no definite lytic lesion seen in the visualized skeleton.  Bone survey on 11/19/2018 revealed no suspicious lucent lesions and no acute bony abnormality.  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  Zometa was discontinued in 01/2017.  She has B12 deficiency.  B12 was 254 on 04/09/2017, 295 on 08/20/2018, and 391 on 10/08/2018.  She began oral B12.  Symptomatically, she has chronic nausea s/p cholecystectomy.  She notes recent constipation then diarrhea.   She denies any infections.  She denies any bone pain except for back discomfort from spinal stenosis.  Plan: 1.   Review labs from 12/09/2018. 2.   Labs today: CMP, Mg. 3.   Stage III multiple myeloma             Clinically, she is doing well.  She is s/p autologous stem cell transplant in 06/2015.  She is off maintenance Revlimid secondary to intolerance.             M-spike was 0 on 10/20/2018.             Lambda light light chains 160.7 (ratio 0.10) on 12/09/2018.             Bone survey on 11/19/2018 revealed no lytic lesions.             Continue close surveillance. 4.   Normocytic anemia             Hematocrit 34.8.  Hemoglobin 12.0.  MCV 87.2 on 10/20/2018.             B12 deficiency on supplementation.             Folate and TSH are normal.             Ferritin was 101 on 08/20/2018.             Anemia multifactorial with some component of chronic kidney disease. 4.   Stage III chronic kidney disease             BUN 21.  Creatinine 1.16 on 12/09/2018.             Patient followed by Dr Holley Raring. 5.   Hypomagnesemia, chronic             Magnesium 1.1 today.             Magnesium sulfate 4 gm IV today.             RTC weekly for Mg check and +/- IV magnesium. 6.   Osteonecrosis of the jaw             ONJ occurred in 01/2017 secondary to Zometa.             She receives no further bisphosphonates. 7.   Chronic back pain             Etiology is not secondary to multiple myeloma.             Patient on Fentanyl patches per [facility] pain clinic. 8.   Grade I-II peripheral neuropathy             Etiology secondary to prior chemotherapy.             Continue to monitor. 9.   Chronic nausea             Patient followed by Dr. Vicente Males.  Schedule follow-up.  Continue Phenergan 25 mg p.o. twice daily before meals as needed. 10.   RTC weekly on Mondays x 3 for labs (Mg) and +/- IV Mg 11.   After the 4th week, then every other Monday x 2  for labs (Mg) and IV Mg. 12.   RTC on 02/08/2019 for  MD assessment, labs (CBC with diff, CMP, SPEP, FLCA, Mg), and +/- IV Mg.   I discussed the assessment and treatment plan with the patient.  The patient was provided an opportunity to ask questions and all were answered.  The patient agreed with the plan and demonstrated an understanding of the instructions.  The patient was advised to call back if the symptoms worsen or if the condition fails to improve as anticipated.  I provided 20 minutes of face-to-face time during this this encounter and > 50% was spent counseling as documented under my assessment and plan.    Lequita Asal, MD, PhD    12/17/2018, 10:14 AM  I, Molly Dorshimer, am acting as Education administrator for [employer]. Mike Gip, MD, PhD.  I, Melissa C. Mike Gip, MD, have reviewed the above documentation for accuracy and completeness, and I agree with the above.

## 2018-12-17 ENCOUNTER — Inpatient Hospital Stay (HOSPITAL_BASED_OUTPATIENT_CLINIC_OR_DEPARTMENT_OTHER): Payer: Medicare PPO | Admitting: Hematology and Oncology

## 2018-12-17 ENCOUNTER — Encounter: Payer: Self-pay | Admitting: Hematology and Oncology

## 2018-12-17 ENCOUNTER — Inpatient Hospital Stay: Payer: Medicare PPO

## 2018-12-17 ENCOUNTER — Other Ambulatory Visit: Payer: Medicare Other

## 2018-12-17 VITALS — BP 141/76 | HR 65 | Temp 97.7°F | Resp 16 | Wt 159.9 lb

## 2018-12-17 DIAGNOSIS — E538 Deficiency of other specified B group vitamins: Secondary | ICD-10-CM

## 2018-12-17 DIAGNOSIS — Z7982 Long term (current) use of aspirin: Secondary | ICD-10-CM

## 2018-12-17 DIAGNOSIS — D696 Thrombocytopenia, unspecified: Secondary | ICD-10-CM

## 2018-12-17 DIAGNOSIS — Z791 Long term (current) use of non-steroidal anti-inflammatories (NSAID): Secondary | ICD-10-CM

## 2018-12-17 DIAGNOSIS — E612 Magnesium deficiency: Secondary | ICD-10-CM

## 2018-12-17 DIAGNOSIS — G620 Drug-induced polyneuropathy: Secondary | ICD-10-CM | POA: Insufficient documentation

## 2018-12-17 DIAGNOSIS — K5900 Constipation, unspecified: Secondary | ICD-10-CM

## 2018-12-17 DIAGNOSIS — G8929 Other chronic pain: Secondary | ICD-10-CM

## 2018-12-17 DIAGNOSIS — Z8249 Family history of ischemic heart disease and other diseases of the circulatory system: Secondary | ICD-10-CM

## 2018-12-17 DIAGNOSIS — K219 Gastro-esophageal reflux disease without esophagitis: Secondary | ICD-10-CM

## 2018-12-17 DIAGNOSIS — C9001 Multiple myeloma in remission: Secondary | ICD-10-CM

## 2018-12-17 DIAGNOSIS — Z9484 Stem cells transplant status: Secondary | ICD-10-CM

## 2018-12-17 DIAGNOSIS — E1140 Type 2 diabetes mellitus with diabetic neuropathy, unspecified: Secondary | ICD-10-CM

## 2018-12-17 DIAGNOSIS — R110 Nausea: Secondary | ICD-10-CM | POA: Insufficient documentation

## 2018-12-17 DIAGNOSIS — R5383 Other fatigue: Secondary | ICD-10-CM

## 2018-12-17 DIAGNOSIS — I130 Hypertensive heart and chronic kidney disease with heart failure and stage 1 through stage 4 chronic kidney disease, or unspecified chronic kidney disease: Secondary | ICD-10-CM

## 2018-12-17 DIAGNOSIS — R252 Cramp and spasm: Secondary | ICD-10-CM

## 2018-12-17 DIAGNOSIS — Z833 Family history of diabetes mellitus: Secondary | ICD-10-CM

## 2018-12-17 DIAGNOSIS — N183 Chronic kidney disease, stage 3 (moderate): Secondary | ICD-10-CM

## 2018-12-17 DIAGNOSIS — D631 Anemia in chronic kidney disease: Secondary | ICD-10-CM

## 2018-12-17 DIAGNOSIS — T451X5A Adverse effect of antineoplastic and immunosuppressive drugs, initial encounter: Secondary | ICD-10-CM

## 2018-12-17 DIAGNOSIS — Z803 Family history of malignant neoplasm of breast: Secondary | ICD-10-CM

## 2018-12-17 DIAGNOSIS — Z7984 Long term (current) use of oral hypoglycemic drugs: Secondary | ICD-10-CM

## 2018-12-17 DIAGNOSIS — M546 Pain in thoracic spine: Secondary | ICD-10-CM

## 2018-12-17 DIAGNOSIS — R197 Diarrhea, unspecified: Secondary | ICD-10-CM

## 2018-12-17 DIAGNOSIS — Z79899 Other long term (current) drug therapy: Secondary | ICD-10-CM

## 2018-12-17 DIAGNOSIS — Z87891 Personal history of nicotine dependence: Secondary | ICD-10-CM

## 2018-12-17 DIAGNOSIS — E1122 Type 2 diabetes mellitus with diabetic chronic kidney disease: Secondary | ICD-10-CM

## 2018-12-17 LAB — COMPREHENSIVE METABOLIC PANEL
ALT: 53 U/L — ABNORMAL HIGH (ref 0–44)
AST: 49 U/L — ABNORMAL HIGH (ref 15–41)
Albumin: 4.4 g/dL (ref 3.5–5.0)
Alkaline Phosphatase: 80 U/L (ref 38–126)
Anion gap: 10 (ref 5–15)
BUN: 15 mg/dL (ref 8–23)
CO2: 22 mmol/L (ref 22–32)
Calcium: 8.9 mg/dL (ref 8.9–10.3)
Chloride: 105 mmol/L (ref 98–111)
Creatinine, Ser: 1.07 mg/dL — ABNORMAL HIGH (ref 0.44–1.00)
GFR calc Af Amer: >60 mL/min (ref >60)
GFR calc non Af Amer: 56 mL/min — ABNORMAL LOW (ref >60)
Glucose, Bld: 144 mg/dL — ABNORMAL HIGH (ref 70–99)
Potassium: 3.9 mmol/L (ref 3.5–5.1)
Sodium: 137 mmol/L (ref 135–145)
Total Bilirubin: 0.6 mg/dL (ref 0.3–1.2)
Total Protein: 7.3 g/dL (ref 6.5–8.1)

## 2018-12-17 LAB — MAGNESIUM: Magnesium: 1.1 mg/dL — ABNORMAL LOW (ref 1.7–2.4)

## 2018-12-17 MED ORDER — MAGNESIUM SULFATE 4 GM/100ML IV SOLN
4.0000 g | Freq: Once | INTRAVENOUS | Status: AC
  Administered 2018-12-17: 4 g via INTRAVENOUS

## 2018-12-17 MED ORDER — SODIUM CHLORIDE 0.9 % IV SOLN
Freq: Once | INTRAVENOUS | Status: AC
  Administered 2018-12-17: 11:00:00 via INTRAVENOUS
  Filled 2018-12-17: qty 250

## 2018-12-17 NOTE — Patient Instructions (Signed)
Hypomagnesemia  Hypomagnesemia is a condition in which the level of magnesium in the blood is low. Magnesium is a mineral that is found in many foods. It is used in many different processes in the body. Hypomagnesemia can affect every organ in the body. In severe cases, it can cause life-threatening problems.  What are the causes?  This condition may be caused by:   Not getting enough magnesium in your diet.   Malnutrition.   Problems with absorbing magnesium from the intestines.   Dehydration.   Alcohol abuse.   Vomiting.   Severe or chronic diarrhea.   Some medicines, including medicines that make you urinate more (diuretics).   Certain diseases, such as kidney disease, diabetes, celiac disease, and overactive thyroid.  What are the signs or symptoms?  Symptoms of this condition include:   Loss of appetite.   Nausea and vomiting.   Involuntary shaking or trembling of a body part (tremor).   Muscle weakness.   Tingling in the arms and legs.   Sudden tightening of muscles (muscle spasms).   Confusion.   Psychiatric issues, such as depression, irritability, or psychosis.   A feeling of fluttering of the heart.   Seizures.  These symptoms are more severe if magnesium levels drop suddenly.  How is this diagnosed?  This condition may be diagnosed based on:   Your symptoms and medical history.   A physical exam.   Blood and urine tests.  How is this treated?  Treatment depends on the cause and the severity of the condition. It may be treated with:   A magnesium supplement. This can be taken in pill form. If the condition is severe, magnesium is usually given through an IV.   Changes to your diet. You may be directed to eat foods that have a lot of magnesium, such as green leafy vegetables, peas, beans, and nuts.   Stopping any intake of alcohol.  Follow these instructions at home:          Make sure that your diet includes foods with magnesium. Foods that have a lot of magnesium in them  include:  ? Green leafy vegetables, such as spinach and broccoli.  ? Beans and peas.  ? Nuts and seeds, such as almonds and sunflower seeds.  ? Whole grains, such as whole grain bread and fortified cereals.   Take magnesium supplements if your health care provider tells you to do that. Take them as directed.   Take over-the-counter and prescription medicines only as told by your health care provider.   Have your magnesium levels monitored as told by your health care provider.   When you are active, drink fluids that contain electrolytes.   Avoid drinking alcohol.   Keep all follow-up visits as told by your health care provider. This is important.  Contact a health care provider if:   You get worse instead of better.   Your symptoms return.  Get help right away if you:   Develop severe muscle weakness.   Have trouble breathing.   Feel that your heart is racing.  Summary   Hypomagnesemia is a condition in which the level of magnesium in the blood is low.   Hypomagnesemia can affect every organ in the body.   Treatment may include eating more foods that contain magnesium, taking magnesium supplements, and not drinking alcohol.   Have your magnesium levels monitored as told by your health care provider.  This information is not intended to replace advice given   to you by your health care provider. Make sure you discuss any questions you have with your health care provider.  Document Released: 03/13/2005 Document Revised: 05/19/2017 Document Reviewed: 05/19/2017  Elsevier Interactive Patient Education  2019 [employer]

## 2018-12-17 NOTE — Progress Notes (Signed)
Pt her for follow up. Pt reports having abdominal pain/discomfort, diarrhea/constipation, and nausea x 3 weeks. PCP is aware per patient. Denies any other concerns.

## 2018-12-18 ENCOUNTER — Other Ambulatory Visit: Payer: Self-pay | Admitting: Urology

## 2018-12-18 ENCOUNTER — Other Ambulatory Visit: Payer: Self-pay

## 2018-12-21 ENCOUNTER — Encounter: Payer: Medicare Other | Admitting: Internal Medicine

## 2018-12-21 ENCOUNTER — Inpatient Hospital Stay: Payer: Medicare PPO

## 2018-12-21 ENCOUNTER — Other Ambulatory Visit: Payer: Self-pay | Admitting: Hematology and Oncology

## 2018-12-21 ENCOUNTER — Ambulatory Visit: Payer: Medicare Other | Admitting: Urology

## 2018-12-21 ENCOUNTER — Other Ambulatory Visit: Payer: Self-pay

## 2018-12-21 VITALS — BP 104/66 | HR 85 | Temp 98.1°F | Resp 18 | Wt 158.1 lb

## 2018-12-21 DIAGNOSIS — C9001 Multiple myeloma in remission: Secondary | ICD-10-CM

## 2018-12-21 DIAGNOSIS — R110 Nausea: Secondary | ICD-10-CM

## 2018-12-21 DIAGNOSIS — G620 Drug-induced polyneuropathy: Secondary | ICD-10-CM

## 2018-12-21 DIAGNOSIS — R7989 Other specified abnormal findings of blood chemistry: Secondary | ICD-10-CM

## 2018-12-21 DIAGNOSIS — E538 Deficiency of other specified B group vitamins: Secondary | ICD-10-CM

## 2018-12-21 DIAGNOSIS — E612 Magnesium deficiency: Secondary | ICD-10-CM

## 2018-12-21 LAB — BASIC METABOLIC PANEL
Anion gap: 10 (ref 5–15)
BUN: 26 mg/dL — ABNORMAL HIGH (ref 8–23)
CO2: 19 mmol/L — ABNORMAL LOW (ref 22–32)
Calcium: 9.2 mg/dL (ref 8.9–10.3)
Chloride: 105 mmol/L (ref 98–111)
Creatinine, Ser: 1.6 mg/dL — ABNORMAL HIGH (ref 0.44–1.00)
GFR calc Af Amer: 40 mL/min — ABNORMAL LOW (ref >60)
GFR calc non Af Amer: 34 mL/min — ABNORMAL LOW (ref >60)
Glucose, Bld: 145 mg/dL — ABNORMAL HIGH (ref 70–99)
Potassium: 4.6 mmol/L (ref 3.5–5.1)
Sodium: 134 mmol/L — ABNORMAL LOW (ref 135–145)

## 2018-12-21 LAB — MAGNESIUM: Magnesium: 1.3 mg/dL — ABNORMAL LOW (ref 1.7–2.4)

## 2018-12-21 MED ORDER — SODIUM CHLORIDE 0.9 % IV SOLN
Freq: Once | INTRAVENOUS | Status: AC
  Administered 2018-12-21: 09:00:00 via INTRAVENOUS
  Filled 2018-12-21: qty 250

## 2018-12-21 MED ORDER — MAGNESIUM SULFATE 4 GM/100ML IV SOLN
4.0000 g | Freq: Once | INTRAVENOUS | Status: AC
  Administered 2018-12-21: 4 g via INTRAVENOUS
  Filled 2018-12-21: qty 100

## 2018-12-21 MED ORDER — SODIUM CHLORIDE 0.9 % IV SOLN
Freq: Once | INTRAVENOUS | Status: AC
  Administered 2018-12-21: 12:00:00 via INTRAVENOUS
  Filled 2018-12-21: qty 250

## 2018-12-21 NOTE — Progress Notes (Signed)
Pt received 2g of Mg from 4g bag, per Dr. Mike Gip due to pt's crt being elevated.

## 2018-12-28 ENCOUNTER — Ambulatory Visit: Payer: Medicare Other | Admitting: Urology

## 2018-12-28 ENCOUNTER — Other Ambulatory Visit: Payer: Self-pay | Admitting: Hematology and Oncology

## 2018-12-28 ENCOUNTER — Inpatient Hospital Stay: Payer: Medicare PPO

## 2018-12-28 ENCOUNTER — Other Ambulatory Visit: Payer: Self-pay

## 2018-12-28 VITALS — BP 101/61 | HR 61 | Temp 96.7°F | Resp 18

## 2018-12-28 DIAGNOSIS — E612 Magnesium deficiency: Secondary | ICD-10-CM

## 2018-12-28 DIAGNOSIS — C9001 Multiple myeloma in remission: Secondary | ICD-10-CM | POA: Diagnosis not present

## 2018-12-28 DIAGNOSIS — G620 Drug-induced polyneuropathy: Secondary | ICD-10-CM

## 2018-12-28 DIAGNOSIS — R110 Nausea: Secondary | ICD-10-CM

## 2018-12-28 DIAGNOSIS — E538 Deficiency of other specified B group vitamins: Secondary | ICD-10-CM

## 2018-12-28 LAB — MAGNESIUM: Magnesium: 1.1 mg/dL — ABNORMAL LOW (ref 1.7–2.4)

## 2018-12-28 LAB — CREATININE, SERUM
Creatinine, Ser: 1.51 mg/dL — ABNORMAL HIGH (ref 0.44–1.00)
GFR calc Af Amer: 42 mL/min — ABNORMAL LOW (ref >60)
GFR calc non Af Amer: 37 mL/min — ABNORMAL LOW (ref >60)

## 2018-12-28 MED ORDER — SODIUM CHLORIDE 0.9 % IV SOLN
Freq: Once | INTRAVENOUS | Status: AC
  Administered 2018-12-28: 09:00:00 via INTRAVENOUS
  Filled 2018-12-28: qty 250

## 2018-12-28 MED ORDER — MAGNESIUM SULFATE 4 GM/100ML IV SOLN
4.0000 g | Freq: Once | INTRAVENOUS | Status: AC
  Administered 2018-12-28: 4 g via INTRAVENOUS
  Filled 2018-12-28: qty 100

## 2018-12-28 NOTE — Patient Instructions (Signed)
Hypomagnesemia Hypomagnesemia is a condition in which the level of magnesium in the blood is low. Magnesium is a mineral that is found in many foods. It is used in many different processes in the body. Hypomagnesemia can affect every organ in the body. In severe cases, it can cause life-threatening problems. What are the causes? This condition may be caused by:  Not getting enough magnesium in your diet.  Malnutrition.  Problems with absorbing magnesium from the intestines.  Dehydration.  Alcohol abuse.  Vomiting.  Severe or chronic diarrhea.  Some medicines, including medicines that make you urinate more (diuretics).  Certain diseases, such as kidney disease, diabetes, celiac disease, and overactive thyroid. What are the signs or symptoms? Symptoms of this condition include:  Loss of appetite.  Nausea and vomiting.  Involuntary shaking or trembling of a body part (tremor).  Muscle weakness.  Tingling in the arms and legs.  Sudden tightening of muscles (muscle spasms).  Confusion.  Psychiatric issues, such as depression, irritability, or psychosis.  A feeling of fluttering of the heart.  Seizures. These symptoms are more severe if magnesium levels drop suddenly. How is this diagnosed? This condition may be diagnosed based on:  Your symptoms and medical history.  A physical exam.  Blood and urine tests. How is this treated? Treatment depends on the cause and the severity of the condition. It may be treated with:  A magnesium supplement. This can be taken in pill form. If the condition is severe, magnesium is usually given through an IV.  Changes to your diet. You may be directed to eat foods that have a lot of magnesium, such as green leafy vegetables, peas, beans, and nuts.  Stopping any intake of alcohol. Follow these instructions at home:      Make sure that your diet includes foods with magnesium. Foods that have a lot of magnesium in them include:  ? Green leafy vegetables, such as spinach and broccoli. ? Beans and peas. ? Nuts and seeds, such as almonds and sunflower seeds. ? Whole grains, such as whole grain bread and fortified cereals.  Take magnesium supplements if your health care provider tells you to do that. Take them as directed.  Take over-the-counter and prescription medicines only as told by your health care provider.  Have your magnesium levels monitored as told by your health care provider.  When you are active, drink fluids that contain electrolytes.  Avoid drinking alcohol.  Keep all follow-up visits as told by your health care provider. This is important. Contact a health care provider if:  You get worse instead of better.  Your symptoms return. Get help right away if you:  Develop severe muscle weakness.  Have trouble breathing.  Feel that your heart is racing. Summary  Hypomagnesemia is a condition in which the level of magnesium in the blood is low.  Hypomagnesemia can affect every organ in the body.  Treatment may include eating more foods that contain magnesium, taking magnesium supplements, and not drinking alcohol.  Have your magnesium levels monitored as told by your health care provider. This information is not intended to replace advice given to you by your health care provider. Make sure you discuss any questions you have with your health care provider. Document Released: 03/13/2005 Document Revised: 05/30/2017 Document Reviewed: 05/19/2017 Elsevier Patient Education  2020 [employer] Inc.  

## 2019-01-04 ENCOUNTER — Inpatient Hospital Stay: Payer: Medicare PPO

## 2019-01-04 ENCOUNTER — Ambulatory Visit (INDEPENDENT_AMBULATORY_CARE_PROVIDER_SITE_OTHER): Payer: Medicare PPO | Admitting: Urology

## 2019-01-04 ENCOUNTER — Other Ambulatory Visit: Payer: Self-pay

## 2019-01-04 ENCOUNTER — Ambulatory Visit
Admission: RE | Admit: 2019-01-04 | Discharge: 2019-01-04 | Disposition: A | Discharge status: Home / Self Care | Payer: Medicare PPO | Attending: Hematology and Oncology | Admitting: Hematology and Oncology

## 2019-01-04 ENCOUNTER — Ambulatory Visit
Admission: RE | Admit: 2019-01-04 | Discharge: 2019-01-04 | Disposition: A | Discharge status: Home / Self Care | Payer: Medicare PPO | Source: Ambulatory Visit | Attending: Internal Medicine | Admitting: Internal Medicine

## 2019-01-04 ENCOUNTER — Inpatient Hospital Stay: Payer: Medicare PPO | Attending: Hematology and Oncology

## 2019-01-04 ENCOUNTER — Encounter: Payer: Self-pay | Admitting: Internal Medicine

## 2019-01-04 ENCOUNTER — Ambulatory Visit (INDEPENDENT_AMBULATORY_CARE_PROVIDER_SITE_OTHER): Payer: Medicare PPO | Admitting: Internal Medicine

## 2019-01-04 ENCOUNTER — Encounter: Payer: Self-pay | Admitting: Urology

## 2019-01-04 VITALS — BP 136/75 | HR 73 | Ht 62.0 in | Wt 157.0 lb

## 2019-01-04 VITALS — BP 126/84 | HR 62 | Ht 62.0 in | Wt 157.0 lb

## 2019-01-04 VITALS — BP 136/68 | HR 71 | Resp 20

## 2019-01-04 DIAGNOSIS — R222 Localized swelling, mass and lump, trunk: Secondary | ICD-10-CM

## 2019-01-04 DIAGNOSIS — F324 Major depressive disorder, single episode, in partial remission: Secondary | ICD-10-CM | POA: Diagnosis not present

## 2019-01-04 DIAGNOSIS — C9001 Multiple myeloma in remission: Secondary | ICD-10-CM | POA: Diagnosis not present

## 2019-01-04 DIAGNOSIS — N3946 Mixed incontinence: Secondary | ICD-10-CM

## 2019-01-04 LAB — MAGNESIUM: Magnesium: 1.3 mg/dL — ABNORMAL LOW (ref 1.7–2.4)

## 2019-01-04 MED ORDER — FLUOXETINE HCL 40 MG PO CAPS: 40.0000 mg | ORAL_CAPSULE | Freq: Every day | ORAL | 1 refills | Status: DC

## 2019-01-04 MED ORDER — OXYBUTYNIN CHLORIDE ER 10 MG PO TB24: 10.0000 mg | ORAL_TABLET | Freq: Every day | ORAL | 11 refills | Status: DC

## 2019-01-04 MED ORDER — SODIUM CHLORIDE 0.9 % IV SOLN
Freq: Once | INTRAVENOUS | Status: AC
  Administered 2019-01-04: 13:00:00 via INTRAVENOUS
  Filled 2019-01-04: qty 250

## 2019-01-04 MED ORDER — MAGNESIUM SULFATE 4 GM/100ML IV SOLN
4.0000 g | Freq: Once | INTRAVENOUS | Status: AC
  Administered 2019-01-04: 4 g via INTRAVENOUS
  Filled 2019-01-04: qty 100

## 2019-01-04 NOTE — Progress Notes (Signed)
Patient reports that she has seen her primary for a swollen supraclavicular area on her left side.  Patient was sent for an xray and is to hear from her primary if there is any diagnosis or concerns.

## 2019-01-04 NOTE — Progress Notes (Signed)
01/04/2019 11:39 AM   [name] 1955/10/09 [id number]  Referring provider: Glean Hess, MD [address] [place],  [place] [address]  Chief Complaint  Patient presents with  . Urinary Incontinence    Follow up    HPI: I was consulted to assess the patient is urinary incontinence worsening the last year.  She describes a sling in 2009.  She leaks with coughing sneezing at times and might with heavy lifting.  She leaks more when she is active.  She can sit and feel a bubble or small leak.  Sometimes she has urge incontinence.  She has mild bedwetting.  She wears 2 or 3 pads a day depending on activity level and they are damp  She voids every 4 or 5 hours and gets up twice a night to urinate.  She can have an urge and then cannot go well and will strain.  She said the flow is slow and thick.  She will stop and start.  She will stand up and sedated again.  She does not feel empty.  She had a TIA last year.  She has had a hysterectomy.  She gets treatments and injections and perhaps radiofrequency treatment on her lower back but is no vaginal neurologic symptoms.  She is on oral hypoglycemics.  She failed Myrbetriq prior to her sling.   Patient had a fixed bladder neck with a shorter urethra.  She had no cystocele or prolapse.  She had some vaginal epithelial folds but I could not feel or see any sling extrusion.  She had no stress incontinence.  The patient has mixed incontinence.  She has bedwetting.  She has flow symptoms.  Pathophysiology discussed and the role of urodynamics discussed.  Her flow symptoms were a little bit concerning and they will be evaluated as well during the urodynamics.  She does have a shorter urethra and a fixed bladder neck.  If her stress incontinence was treated in the future a urethral injectable would likely be most ideal.  Her anatomy and her symptoms with a repeat sling could certainly threaten her flow symptoms further and efficacy  would need to be lowered based upon the fixed urethra.  Today Frequency stable.  Incontinence stable On urodynamics the patient did not void and was catheterized for 50 mL.  Maximum bladder capacity was 860 mL.  Bladder was hyposensitive and stable.  She did describe large volume leakage when she go from a sitting to standing position and leaking during sleeping.  She had no stress incontinence with a Valsalva pressure of 143 cm of water.  She had trouble voiding in the lab setting.  She voided 145 mL with a max flow 5 mils per second.  Maximum voiding pressure 7 cm of water.  Residual was 750 mL.  EMG increased during voiding.  The details of the urodynamics are signed and dictated.  Bladder neck only descent at 1 cm   PMH: Past Medical History:  Diagnosis Date  . Abnormal stress test 02/14/2016   Overview:  Added automatically from request for surgery [id number]  . Anemia   . Anxiety   . Arthritis   . Bicuspid aortic valve   . CHF (congestive heart failure) ([place])   . CKD (chronic kidney disease) stage 3, GFR 30-59 ml/min (HCC)   . Depression   . Diabetes mellitus ([place])   . Dizziness   . Fatty liver   . Frequent falls   . GERD (gastroesophageal reflux disease)   .  Gout   . Heart murmur   . History of blood transfusion   . History of bone marrow transplant ([place])   . History of uterine fibroid   . Hx of cardiac catheterization 06/05/2016   Overview:  Normal coronaries 2017  . Hypertension   . Hypomagnesemia   . Multiple myeloma ([place])   . Personal history of chemotherapy   . Renal cyst     Surgical History: Past Surgical History:  Procedure Laterality Date  . ABDOMINAL HYSTERECTOMY    . 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 CARDIAC ELECTROPHYSIOLOGY MAPPING AND ABLATION    . CARPAL TUNNEL RELEASE Bilateral   . CHOLECYSTECTOMY  2008  . COLONOSCOPY WITH PROPOFOL N/A 05/07/2017   Procedure: COLONOSCOPY WITH PROPOFOL;  Surgeon: [name], MD;  Location[facility] ENDOSCOPY;  Service:  Gastroenterology;  Laterality: N/A;  . ESOPHAGOGASTRODUODENOSCOPY (EGD) WITH PROPOFOL N/A 05/07/2017   Procedure: ESOPHAGOGASTRODUODENOSCOPY (EGD) WITH PROPOFOL;  Surgeon: [name], MD;  Location[facility] ENDOSCOPY;  Service: Gastroenterology;  Laterality: N/A;  . FOOT SURGERY Bilateral   . INCONTINENCE SURGERY  2009  . PARTIAL HYSTERECTOMY  03/1996   fibroids  . TONSILLECTOMY  2007    Home Medications:  Allergies as of 01/04/2019      Reactions   Oxycodone-acetaminophen Anaphylaxis   Swelling and rash   Celebrex [celecoxib] Diarrhea   Codeine    Benadryl [diphenhydramine] Palpitations   Morphine Itching, Rash   Ondansetron Diarrhea   Tylenol [acetaminophen] Itching, Rash      Medication List       Accurate as of January 04, 2019 11:39 AM. If you have any questions, ask your nurse or doctor.        allopurinol 100 MG tablet Commonly known as: ZYLOPRIM TAKE 1 TABLET(100 MG) BY MOUTH DAILY as needed   aspirin 81 MG chewable tablet Chew 1 tablet (81 mg total) by mouth daily.   bisoprolol 10 MG tablet Commonly known as: ZEBETA TAKE 1 TABLET(10 MG) BY MOUTH DAILY   celecoxib 100 MG capsule Commonly known as: CELEBREX TAKE 1 CAPSULE(100 MG) BY MOUTH DAILY   diazepam 2 MG tablet Commonly known as: VALIUM Take 1 tablet (2 mg total) by mouth every 12 (twelve) hours as needed for anxiety.   diphenoxylate-atropine 2.5-0.025 MG tablet Commonly known as: LOMOTIL Take 2 tablets by mouth daily as needed for diarrhea or loose stools.   fentaNYL 25 MCG/HR Commonly known as: DURAGESIC Place 25 mcg onto the skin every 3 (three) days.   fexofenadine 180 MG tablet Commonly known as: ALLEGRA TAKE 1 TABLET(180 MG) BY MOUTH DAILY What changed: See the new instructions.   FLUoxetine 40 MG capsule Commonly known as: PROZAC Take 1 capsule (40 mg total) by mouth daily. What changed:   medication strength  how much to take Changed by: Halina Maidens, MD   fluticasone 50 MCG/ACT  nasal spray Commonly known as: FLONASE Place 2 sprays into both nostrils daily. What changed:   when to take this  reasons to take this   furosemide 20 MG tablet Commonly known as: LASIX Take 20 mg by mouth as needed.   HAIR/SKIN/NAILS/BIOTIN PO Take 1 capsule by mouth 3 (three) times daily.   hydrOXYzine 10 MG tablet Commonly known as: ATARAX/VISTARIL TAKE 1 TABLET(10 MG) BY MOUTH THREE TIMES DAILY AS NEEDED   lisinopril 10 MG tablet Commonly known as: ZESTRIL Take 10 mg by mouth daily.   Mag Glycinate 100 MG Tabs Take 100 mg by mouth 2 (two)  times daily.   MAGNESIUM CL-CALCIUM CARBONATE PO Take 1 tablet by mouth daily.   metFORMIN 500 MG 24 hr tablet Commonly known as: GLUCOPHAGE-XR Take 1 tablet (500 mg total) by mouth daily with breakfast.   NON FORMULARY   Omega 3-6-9 Complex Caps Take by mouth.   omeprazole 40 MG capsule Commonly known as: PRILOSEC Take 1 capsule (40 mg total) by mouth daily.   ONE TOUCH ULTRA TEST test strip Generic drug: glucose blood   OSTEO BI-FLEX ADV TRIPLE ST PO Take 2 tablets by mouth daily.   oxybutynin 10 MG 24 hr tablet Commonly known as: Ditropan XL Take 1 tablet (10 mg total) by mouth at bedtime. Started by: Reece Packer, MD   pravastatin 20 MG tablet Commonly known as: PRAVACHOL TAKE 1 TABLET BY MOUTH  DAILY   promethazine 25 MG tablet Commonly known as: PHENERGAN TAKE 1 TABLET BY MOUTH TWICE DAILY BEFORE MEALS AS NEEDED   tiZANidine 4 MG tablet Commonly known as: ZANAFLEX Take 1 tablet (4 mg total) by mouth every 8 (eight) hours as needed for muscle spasms.   traZODone 100 MG tablet Commonly known as: DESYREL Take 1 tablet (100 mg total) by mouth at bedtime. For sleep.   Vitamin K2 100 MCG Caps Take 1 tablet by mouth daily.       Allergies:  Allergies  Allergen Reactions  . Oxycodone-Acetaminophen Anaphylaxis    Swelling and rash  . Celebrex [Celecoxib] Diarrhea  . Codeine   . Benadryl  [Diphenhydramine] Palpitations  . Morphine Itching and Rash  . Ondansetron Diarrhea  . Tylenol [Acetaminophen] Itching and Rash    Family History: Family History  Problem Relation Age of Onset  . Colon cancer Father   . Renal Disease Father   . Diabetes Mellitus II Father   . Melanoma Paternal Grandmother   . Breast cancer Maternal Aunt 62  . Anemia Mother   . Heart disease Mother   . Heart failure Mother   . Renal Disease Mother   . Congestive Heart Failure Mother   . Heart disease Maternal Uncle   . Throat cancer Maternal Uncle   . Lung cancer Maternal Uncle   . Liver disease Maternal Uncle   . Heart failure Maternal Uncle   . Hearing loss Son 38       Suicide     Social History:  reports that she quit smoking about 27 years ago. Her smoking use included cigarettes. She has a 20.00 pack-year smoking history. She has never used smokeless tobacco. She reports current alcohol use of about 2.0 standard drinks of alcohol per week. She reports that she does not use drugs.  ROS: UROLOGY Frequent Urination?: No Hard to postpone urination?: No Burning/pain with urination?: No Get up at night to urinate?: Yes Leakage of urine?: No Urine stream starts and stops?: Yes Trouble starting stream?: No Do you have to strain to urinate?: No Blood in urine?: No Urinary tract infection?: No Sexually transmitted disease?: No Injury to kidneys or bladder?: No Painful intercourse?: No Weak stream?: No Currently pregnant?: No Vaginal bleeding?: No Last menstrual period?: n  Gastrointestinal Nausea?: No Vomiting?: No Indigestion/heartburn?: No Diarrhea?: No Constipation?: No  Constitutional Fever: No Night sweats?: No Weight loss?: No Fatigue?: No  Skin Skin rash/lesions?: No Itching?: No  Eyes Blurred vision?: No Double vision?: No  Ears/Nose/Throat Sore throat?: No Sinus problems?: No  Hematologic/Lymphatic Swollen glands?: No Easy bruising?: No   Cardiovascular Leg swelling?: No Chest pain?: No  Respiratory Cough?: No Shortness of breath?: No  Endocrine Excessive thirst?: No  Musculoskeletal Back pain?: No Joint pain?: No  Neurological Headaches?: No Dizziness?: No  Psychologic Depression?: No Anxiety?: No  Physical Exam: BP 136/75   Pulse 73   Ht _0  (1.575 m)   Wt 157 lb (71.2 kg)   BMI 28.72 kg/m   Constitutional:  Alert and oriented, No acute distress.   Laboratory Data: Lab Results  Component Value Date   WBC 6.6 10/20/2018   HGB 12.0 10/20/2018   HCT 34.8 (L) 10/20/2018   MCV 87.2 10/20/2018   PLT 133 (L) 10/20/2018    Lab Results  Component Value Date   CREATININE 1.51 (H) 12/28/2018    No results found for: PSA  No results found for: TESTOSTERONE  Lab Results  Component Value Date   HGBA1C 7.4 (H) 11/25/2018    Urinalysis    Component Value Date/Time   COLORURINE YELLOW (A) 07/26/2017 0031   APPEARANCEUR Clear 07/13/2018 0835   LABSPEC 1.013 07/26/2017 0031   PHURINE 5.0 07/26/2017 0031   GLUCOSEU Negative 07/13/2018 0835   HGBUR NEGATIVE 07/26/2017 0031   BILIRUBINUR neg 11/25/2018 1021   BILIRUBINUR Negative 07/13/2018 0835   KETONESUR NEGATIVE 07/26/2017 0031   PROTEINUR Positive (A) 11/25/2018 1021   PROTEINUR 1+ (A) 07/13/2018 0835   PROTEINUR NEGATIVE 07/26/2017 0031   UROBILINOGEN 0.2 11/25/2018 1021   NITRITE neg 11/25/2018 1021   NITRITE Negative 07/13/2018 0835   NITRITE NEGATIVE 07/26/2017 0031   LEUKOCYTESUR Negative 11/25/2018 1021   LEUKOCYTESUR Negative 07/13/2018 0835    Pertinent Imaging:   Assessment & Plan: The patient has mixed incontinence.  Likely approximately 70% of the problem is an overactive bladder with some urge incontinence and mild bedwetting.  We could not demonstrate stress incontinence during the test.  She has a lot of risk factors for retention and would need to make a difficult decision but certainly a urethral injectable I think  would be fine in the future depending on treatment goals.  I think the injectable might be a strong consideration over a refractory overactive bladder therapy but I would have to ask her her goals regarding her stress incontinence.  Percutaneous tibial nerve stimulation might be a good option as well.  She would need to be warned of long-term retention based upon the above findings  Assessment 6 weeks on oxybutynin ER 10 mg and proceed accordingly.  1. Mixed incontinence    Return in about 6 weeks (around 02/15/2019) for MD follow up.  Reece Packer, MD  [facility] [address] [place], [place] [address] [phone]

## 2019-01-04 NOTE — Progress Notes (Signed)
Date:  01/04/2019   Name:  Sarah [name]   DOB:  10-25-1955   MRN:  [id number]   Chief Complaint: Depression (Depression follow up after loss of son. 1 week after started Celebrex and Prozac she has been having stomach pains and diarrhea. BUN levels are also elevated 1.60 and last reading was 1.51. Diarrhea is now easing but still having stomach pains. Stopped celebrex almost a month ago. ) and Neck Pain (L) side neck pain and swelling. Knot in neck. Started Saturday. )  Depression        This is a chronic problem.  The problem has been gradually improving since onset.  Associated symptoms include no headaches.  Past treatments include SSRIs - Selective serotonin reuptake inhibitors and other medications (she would like to try a higher dose).  Compliance with treatment is good. Neck Pain  This is a new problem. The current episode started yesterday. The problem occurs constantly. The pain is present in the left side. The quality of the pain is described as aching. The pain is mild. Pertinent negatives include no chest pain, fever or headaches.    Review of Systems  Constitutional: Negative for chills and fever.  Respiratory: Negative for cough, chest tightness, shortness of breath and wheezing.   Cardiovascular: Negative for chest pain and leg swelling.  Musculoskeletal: Positive for arthralgias and neck pain.  Neurological: Negative for dizziness and headaches.  Psychiatric/Behavioral: Positive for depression.    Patient Active Problem List   Diagnosis Date Noted  . Chronic nausea 12/17/2018  . Chemotherapy-induced neuropathy ([place]) 12/17/2018  . Hyperlipidemia associated with type 2 diabetes mellitus ([place]) 11/25/2018  . Normocytic anemia 10/21/2018  . Hypomagnesemia 08/20/2018  . Goals of care, counseling/discussion 08/20/2018  . Low vitamin B12 level 08/20/2018  . Thrombocytopenia ([place]) 02/09/2018  . Benign essential HTN 08/21/2017  . Cardiomyopathy, idiopathic ([place]) 08/21/2017   . Carotid artery stenosis, asymptomatic, bilateral 08/06/2017  . Thyroid nodule 08/06/2017  . Cardiac syncope 07/25/2017  . Iron deficiency anemia due to chronic blood loss 05/13/2017  . Spinal stenosis of lumbar region with neurogenic claudication 04/09/2017  . Magnesium deficiency 04/08/2017  . 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 LBBB (left bundle branch block) 10/10/2016  . Long term prescription opiate use 09/07/2016  . Chronic pain syndrome 07/08/2016  . Pain medication agreement signed 07/08/2016  . Chronic diastolic CHF (congestive heart failure), NYHA class 2 ([place]) 06/05/2016  . LVH (left ventricular hypertrophy) due to hypertensive disease, with heart failure ([place]) 06/05/2016  . Mild aortic valve stenosis 06/05/2016  . 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[place]) 06/05/2016  . Environmental and seasonal allergies 04/19/2016  . Insomnia 04/19/2016  . Bicuspid aortic valve 04/19/2016  . Asthma 04/19/2016  . Aortic regurgitation 04/19/2016  . Type II diabetes mellitus with complication ([place]) 00/17/4944  . Depression, major, single episode, in partial remission ([place]) 04/12/2016  . Irritable bowel syndrome (IBS) 04/12/2016  . Hepatic steatosis 04/12/2016  . Multiple myeloma in remission ([place]) 04/02/2016  . 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[place]) 07/06/2015  . Colitis 02/14/2013  . Disequilibrium 01/14/2013    Allergies  Allergen Reactions  . Oxycodone-Acetaminophen Anaphylaxis    Swelling and rash  . Codeine   . Benadryl [Diphenhydramine] Palpitations  . Morphine Itching and Rash  . Ondansetron Diarrhea  . Tylenol [Acetaminophen] Itching and Rash    Past Surgical History:  Procedure Laterality Date  . ABDOMINAL HYSTERECTOMY    . 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  CARDIAC ELECTROPHYSIOLOGY MAPPING AND ABLATION    . CARPAL TUNNEL RELEASE Bilateral   . CHOLECYSTECTOMY  2008  . COLONOSCOPY WITH PROPOFOL N/A 05/07/2017   Procedure:  COLONOSCOPY WITH PROPOFOL;  Surgeon: [name], MD;  Location[facility] END[facility]COPY;  Service: Gastroenterology;  Laterality: N/A;  . ESOPHAGOGASTRODUODENOSCOPY (EGD) WITH PROPOFOL N/A 05/07/2017   Procedure: ESOPHAGOGASTRODUODENOSCOPY (EGD) WITH PROPOFOL;  Surgeon: [name], MD;  Location: [facility] ENDOSCOPY;  Service: Gastroenterology;  Laterality: N/A;  . FOOT SURGERY Bilateral   . INCONTINENCE SURGERY  2009  . PARTIAL HYSTERECTOMY  03/1996   fibroids  . TONSILLECTOMY  2007    Social History   Tobacco Use  . Smoking status: Former Smoker    Packs/day: 1.00    Years: 20.00    Pack years: 20.00    Types: Cigarettes    Quit date: 07/02/1991    Years since quitting: 27.5  . Smokeless tobacco: Never Used  Substance Use Topics  . Alcohol use: Yes    Alcohol/week: 2.0 standard drinks    Types: 1 Glasses of wine, 1 Cans of beer per week    Comment: occassional  . Drug use: No     Medication list has been reviewed and updated.  Current Meds  Medication Sig  . allopurinol (ZYLOPRIM) 100 MG tablet TAKE 1 TABLET(100 MG) BY MOUTH DAILY as needed  . aspirin 81 MG chewable tablet Chew 1 tablet (81 mg total) by mouth daily.  . bisoprolol (ZEBETA) 10 MG tablet TAKE 1 TABLET(10 MG) BY MOUTH DAILY  . diphenoxylate-atropine (LOMOTIL) 2.5-0.025 MG tablet Take 2 tablets by mouth daily as needed for diarrhea or loose stools.  . fentaNYL (DURAGESIC - DOSED MCG/HR) 25 MCG/HR patch Place 25 mcg onto the skin every 3 (three) days.   . fexofenadine (ALLEGRA) 180 MG tablet TAKE 1 TABLET(180 MG) BY MOUTH DAILY (Patient taking differently: as needed. )  . FLUoxetine (PROZAC) 20 MG capsule Take 1 capsule (20 mg total) by mouth daily.  . fluticasone (FLONASE) 50 MCG/ACT nasal spray Place 2 sprays into both nostrils daily. (Patient taking differently: Place 2 sprays into both nostrils as needed. )  . furosemide (LASIX) 20 MG tablet Take 20 mg by mouth as needed.   Marland Kitchen glucose blood (ONE TOUCH ULTRA TEST) test  strip   . hydrOXYzine (ATARAX/VISTARIL) 10 MG tablet TAKE 1 TABLET(10 MG) BY MOUTH THREE TIMES DAILY AS NEEDED  . lisinopril (PRINIVIL,ZESTRIL) 10 MG tablet Take 10 mg by mouth daily.  . Magnesium Bisglycinate (MAG GLYCINATE) 100 MG TABS Take 100 mg by mouth 2 (two) times daily.  Marland Kitchen MAGNESIUM CL-CALCIUM CARBONATE PO Take 1 tablet by mouth daily.  . Menaquinone-7 (VITAMIN K2) 100 MCG CAPS Take 1 tablet by mouth daily.   . metFORMIN (GLUCOPHAGE-XR) 500 MG 24 hr tablet Take 1 tablet (500 mg total) by mouth daily with breakfast.  . Misc Natural Products (OSTEO BI-FLEX ADV TRIPLE ST PO) Take 2 tablets by mouth daily.  . Multiple Vitamins-Minerals (HAIR/SKIN/NAILS/BIOTIN PO) Take 1 capsule by mouth 3 (three) times daily.  . NON FORMULARY   . Omega 3-6-9 Fatty Acids (OMEGA 3-6-9 COMPLEX) CAPS Take by mouth.  Marland Kitchen omeprazole (PRILOSEC) 40 MG capsule Take 1 capsule (40 mg total) by mouth daily.  . pravastatin (PRAVACHOL) 20 MG tablet TAKE 1 TABLET BY MOUTH  DAILY  . promethazine (PHENERGAN) 25 MG tablet TAKE 1 TABLET BY MOUTH TWICE DAILY BEFORE MEALS AS NEEDED  . tiZANidine (ZANAFLEX) 4 MG tablet Take 1 tablet (4 mg total)  by mouth every 8 (eight) hours as needed for muscle spasms.  . traZODone (DESYREL) 100 MG tablet Take 1 tablet (100 mg total) by mouth at bedtime. For sleep.    PHQ 2/9 Scores 01/04/2019 11/25/2018 10/28/2018 10/13/2018  PHQ - 2 Score 2 6 4  0  PHQ- 9 Score 10 18 11  -    BP Readings from Last 3 Encounters:  01/04/19 126/84  12/28/18 101/61  12/21/18 104/66    Physical Exam Vitals signs and nursing note reviewed.  Constitutional:      General: She is not in acute distress.    Appearance: She is well-developed.  HENT:     Head: Normocephalic and atraumatic.  Neck:     Musculoskeletal: Normal range of motion.     Thyroid: No thyroid mass.   Cardiovascular:     Rate and Rhythm: Normal rate and regular rhythm.     Pulses: Normal pulses.  Pulmonary:     Effort: Pulmonary effort  is normal. No respiratory distress.     Breath sounds: Normal breath sounds and air entry.  Musculoskeletal: Normal range of motion.     Right lower leg: No edema.     Left lower leg: No edema.  Skin:    General: Skin is warm and dry.     Capillary Refill: Capillary refill takes less than 2 seconds.     Findings: No rash.  Neurological:     Mental Status: She is alert and oriented to person, place, and time.  Psychiatric:        Attention and Perception: Attention normal.        Mood and Affect: Mood normal.        Behavior: Behavior normal.        Thought Content: Thought content normal.     Wt Readings from Last 3 Encounters:  01/04/19 157 lb (71.2 kg)  12/21/18 158 lb 1.1 oz (71.7 kg)  12/17/18 159 lb 15.1 oz (72.6 kg)    BP 126/84   Pulse 62   Ht 5' 2"  (1.575 m)   Wt 157 lb (71.2 kg)   SpO2 97%   BMI 28.72 kg/m   Assessment and Plan: 1. Fullness of supraclavicular fossa - DG Chest 2 View; Future  2. Depression, major, single episode, in partial remission ([place]) Improving; will increase dose to 40 mg - FLUoxetine (PROZAC) 40 MG capsule; Take 1 capsule (40 mg total) by mouth daily.  Dispense: 90 capsule; Refill: 1   Partially dictated using Editor, commissioning. Any errors are unintentional.  Halina Maidens, MD [facility]  01/04/2019

## 2019-01-04 NOTE — Patient Instructions (Signed)
Magnesium Sulfate injection What is this medicine? MAGNESIUM SULFATE (mag NEE zee um SUL fate) is an electrolyte injection commonly used to treat low magnesium levels in your blood. It is also used to prevent or control seizures in women with preeclampsia or eclampsia. This medicine may be used for other purposes; ask your health care provider or pharmacist if you have questions. What should I tell my health care provider before I take this medicine? They need to know if you have any of these conditions:  heart disease  history of irregular heart beat  kidney disease  an unusual or allergic reaction to magnesium sulfate, medicines, foods, dyes, or preservatives  pregnant or trying to get pregnant  breast-feeding How should I use this medicine? This medicine is for infusion into a vein. It is given by a health care professional in a hospital or clinic setting. Talk to your pediatrician regarding the use of this medicine in children. While this drug may be prescribed for selected conditions, precautions do apply. Overdosage: If you think you have taken too much of this medicine contact a poison control center or emergency room at once. NOTE: This medicine is only for you. Do not share this medicine with others. What if I miss a dose? This does not apply. What may interact with this medicine? This medicine may interact with the following medications:  certain medicines for anxiety or sleep  certain medicines for seizures like phenobarbital  digoxin  medicines that relax muscles for surgery  narcotic medicines for pain This list may not describe all possible interactions. Give your health care provider a list of all the medicines, herbs, non-prescription drugs, or dietary supplements you use. Also tell them if you smoke, drink alcohol, or use illegal drugs. Some items may interact with your medicine. What should I watch for while using this medicine? Your condition will be  monitored carefully while you are receiving this medicine. You may need blood work done while you are receiving this medicine. What side effects may I notice from receiving this medicine? Side effects that you should report to your doctor or health care professional as soon as possible:  allergic reactions like skin rash, itching or hives, swelling of the face, lips, or tongue  facial flushing  muscle weakness  signs and symptoms of low blood pressure like dizziness; feeling faint or lightheaded, falls; unusually weak or tired  signs and symptoms of a dangerous change in heartbeat or heart rhythm like chest pain; dizziness; fast or irregular heartbeat; palpitations; breathing problems  sweating This list may not describe all possible side effects. Call your doctor for medical advice about side effects. You may report side effects to FDA at 1-800-FDA-1088. Where should I keep my medicine? This drug is given in a hospital or clinic and will not be stored at home. NOTE: This sheet is a summary. It may not cover all possible information. If you have questions about this medicine, talk to your doctor, pharmacist, or health care provider.  2020 Elsevier/Gold Standard (2016-01-03 12:31:42)  

## 2019-01-06 ENCOUNTER — Other Ambulatory Visit: Payer: Self-pay | Admitting: Hematology and Oncology

## 2019-01-06 DIAGNOSIS — R112 Nausea with vomiting, unspecified: Secondary | ICD-10-CM

## 2019-01-11 ENCOUNTER — Inpatient Hospital Stay: Payer: Medicare PPO

## 2019-01-11 ENCOUNTER — Other Ambulatory Visit: Payer: Self-pay

## 2019-01-11 VITALS — BP 124/72 | HR 69 | Temp 97.0°F | Resp 18

## 2019-01-11 DIAGNOSIS — C9001 Multiple myeloma in remission: Secondary | ICD-10-CM | POA: Diagnosis not present

## 2019-01-11 LAB — MAGNESIUM: Magnesium: 1.4 mg/dL — ABNORMAL LOW (ref 1.7–2.4)

## 2019-01-11 MED ORDER — MAGNESIUM SULFATE 4 GM/100ML IV SOLN
4.0000 g | Freq: Once | INTRAVENOUS | Status: AC
  Administered 2019-01-11: 09:00:00 4 g via INTRAVENOUS

## 2019-01-11 MED ORDER — SODIUM CHLORIDE 0.9 % IV SOLN
Freq: Once | INTRAVENOUS | Status: AC
  Administered 2019-01-11: 09:00:00 via INTRAVENOUS
  Filled 2019-01-11: qty 250

## 2019-01-25 ENCOUNTER — Other Ambulatory Visit: Payer: Self-pay

## 2019-01-25 ENCOUNTER — Telehealth: Payer: Self-pay | Admitting: Urology

## 2019-01-25 ENCOUNTER — Ambulatory Visit (INDEPENDENT_AMBULATORY_CARE_PROVIDER_SITE_OTHER): Payer: Medicare PPO

## 2019-01-25 ENCOUNTER — Ambulatory Visit: Payer: Medicare PPO

## 2019-01-25 ENCOUNTER — Ambulatory Visit
Admission: EM | Admit: 2019-01-25 | Discharge: 2019-01-25 | Disposition: A | Discharge status: Home / Self Care | Payer: Medicare PPO | Attending: Emergency Medicine | Admitting: Emergency Medicine

## 2019-01-25 ENCOUNTER — Inpatient Hospital Stay: Payer: Medicare PPO

## 2019-01-25 VITALS — BP 116/72 | HR 62 | Temp 96.8°F | Resp 18

## 2019-01-25 DIAGNOSIS — W19XXXA Unspecified fall, initial encounter: Secondary | ICD-10-CM | POA: Diagnosis not present

## 2019-01-25 DIAGNOSIS — C9001 Multiple myeloma in remission: Secondary | ICD-10-CM

## 2019-01-25 DIAGNOSIS — S20211A Contusion of right front wall of thorax, initial encounter: Secondary | ICD-10-CM | POA: Diagnosis not present

## 2019-01-25 LAB — MAGNESIUM: Magnesium: 1.2 mg/dL — ABNORMAL LOW (ref 1.7–2.4)

## 2019-01-25 MED ORDER — SODIUM CHLORIDE 0.9 % IV SOLN
Freq: Once | INTRAVENOUS | Status: AC
  Administered 2019-01-25: 09:00:00 via INTRAVENOUS
  Filled 2019-01-25: qty 250

## 2019-01-25 MED ORDER — MAGNESIUM SULFATE 4 GM/100ML IV SOLN
4.0000 g | Freq: Once | INTRAVENOUS | Status: AC
  Administered 2019-01-25: 4 g via INTRAVENOUS
  Filled 2019-01-25: qty 100

## 2019-01-25 MED ORDER — DICLOFENAC SODIUM 1 % TD GEL: 2.0000 g | Freq: Four times a day (QID) | TRANSDERMAL | 1 refills | Status: AC

## 2019-01-25 NOTE — Telephone Encounter (Signed)
Spoke with patient regarding her symptoms of passing out. She has tried staggering her medications, she doesn't feel like Oxybutynin has helped.

## 2019-01-25 NOTE — Telephone Encounter (Signed)
Pt said she had a reaction to the Oxybutynin which caused her to pass out while taking w/muscle relaxers and trazodone.  He wants to know if there is something else you could call in for her to [facility] in [place] that is compatable with the current medications she is taking.

## 2019-01-25 NOTE — Patient Instructions (Signed)
Magnesium Sulfate injection What is this medicine? MAGNESIUM SULFATE (mag NEE zee um SUL fate) is an electrolyte injection commonly used to treat low magnesium levels in your blood. It is also used to prevent or control seizures in women with preeclampsia or eclampsia. This medicine may be used for other purposes; ask your health care provider or pharmacist if you have questions. What should I tell my health care provider before I take this medicine? They need to know if you have any of these conditions:  heart disease  history of irregular heart beat  kidney disease  an unusual or allergic reaction to magnesium sulfate, medicines, foods, dyes, or preservatives  pregnant or trying to get pregnant  breast-feeding How should I use this medicine? This medicine is for infusion into a vein. It is given by a health care professional in a hospital or clinic setting. Talk to your pediatrician regarding the use of this medicine in children. While this drug may be prescribed for selected conditions, precautions do apply. Overdosage: If you think you have taken too much of this medicine contact a poison control center or emergency room at once. NOTE: This medicine is only for you. Do not share this medicine with others. What if I miss a dose? This does not apply. What may interact with this medicine? This medicine may interact with the following medications:  certain medicines for anxiety or sleep  certain medicines for seizures like phenobarbital  digoxin  medicines that relax muscles for surgery  narcotic medicines for pain This list may not describe all possible interactions. Give your health care provider a list of all the medicines, herbs, non-prescription drugs, or dietary supplements you use. Also tell them if you smoke, drink alcohol, or use illegal drugs. Some items may interact with your medicine. What should I watch for while using this medicine? Your condition will be  monitored carefully while you are receiving this medicine. You may need blood work done while you are receiving this medicine. What side effects may I notice from receiving this medicine? Side effects that you should report to your doctor or health care professional as soon as possible:  allergic reactions like skin rash, itching or hives, swelling of the face, lips, or tongue  facial flushing  muscle weakness  signs and symptoms of low blood pressure like dizziness; feeling faint or lightheaded, falls; unusually weak or tired  signs and symptoms of a dangerous change in heartbeat or heart rhythm like chest pain; dizziness; fast or irregular heartbeat; palpitations; breathing problems  sweating This list may not describe all possible side effects. Call your doctor for medical advice about side effects. You may report side effects to FDA at 1-800-FDA-1088. Where should I keep my medicine? This drug is given in a hospital or clinic and will not be stored at home. NOTE: This sheet is a summary. It may not cover all possible information. If you have questions about this medicine, talk to your doctor, pharmacist, or health care provider.  2020 Elsevier/Gold Standard (2016-01-03 12:31:42)  

## 2019-01-25 NOTE — Progress Notes (Signed)
Patient reports starting a new bladder spasm medication and passed out last Thursday.  She says that she fell and was able to call for her sister.  She did not seek medical help.  She reports back and shoulder pain and says that she is taking it easy at home.  Patient advised to go to Urgent Care to get xrays due to her fall at home.  Patient verbalized understanding.

## 2019-01-25 NOTE — ED Provider Notes (Signed)
[facility]    CSN: [id number] Arrival date & time: 01/25/19  1141      History   Chief Complaint Chief Complaint  Patient presents with   Loss of Consciousness   Back Pain    HPI Sarah [name] is a 63 y.o. female presenting with pain to her right side post fall. Pt had an unwitnessed fall 4 days ago where she hit her right chest wall and back. Pt attributes fall to a new medication she started 2 weeks prior, oxybutnin. Pt states she was letting her dogs in from outside when she felt her legs getting weak. She tried to grab onto her surroundings furniture to steady herself, but she was not able to in time and proceeded to black out. Pt does not know how long she was out. She was able to pick herself off the floor, but it took her longer than usual due to the pain in her right side. Pt denies headache, nausea, vomiting, vision/mood changes. Pt notes chest wall pain and increased shortness of breath due to pain. She took 29m of ibuprofen ns felt mild relief, but has not taken any additional OTC analgesic since due to her extensive mediale history.    Past Medical History:  Diagnosis Date   Abnormal stress test 02/14/2016   Overview:  Added automatically from request for surgery [id number]   Anemia    Anxiety    Arthritis    Bicuspid aortic valve    CHF (congestive heart failure) (HCC)    CKD (chronic kidney disease) stage 3, GFR 30-59 ml/min (HCC)    Depression    Diabetes mellitus ([place]    Dizziness    Fatty liver    Frequent falls    GERD (gastroesophageal reflux disease)    Gout    Heart murmur    History of blood transfusion    History of bone marrow transplant ([facility]    History of uterine fibroid    Hx of cardiac catheterization 06/05/2016   Overview:  Normal coronaries 2017   Hypertension    Hypomagnesemia    Multiple myeloma ([place]    Personal history of chemotherapy    Renal cyst     Patient Active Problem List   Diagnosis  Date Noted   Chronic nausea 12/17/2018   Chemotherapy-induced neuropathy ([facility] 12/17/2018   Hyperlipidemia associated with type 2 diabetes mellitus ([facility] 11/25/2018   Normocytic anemia 10/21/2018   Hypomagnesemia 08/20/2018   Goals of care, counseling/discussion 08/20/2018   Low vitamin B12 level 08/20/2018   Thrombocytopenia ([facility] 02/09/2018   Benign essential HTN 08/21/2017   Cardiomyopathy, idiopathic ([facility] 08/21/2017   Carotid artery stenosis, asymptomatic, bilateral 08/06/2017   Thyroid nodule 08/06/2017   Cardiac syncope 07/25/2017   Iron deficiency anemia due to chronic blood loss 05/13/2017   Spinal stenosis of lumbar region with neurogenic claudication 04/09/2017   Magnesium deficiency 04/08/2017   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/25/2017   LBBB (left bundle branch block) 10/10/2016   Long term prescription opiate use 09/07/2016   Chronic pain syndrome 07/08/2016   Pain medication agreement signed 07/08/2016   Chronic diastolic CHF (congestive heart failure), NYHA class 2 ([facility] 06/05/2016   LVH (left ventricular hypertrophy) due to hypertensive disease, with heart failure ([facility] 06/05/2016   Mild aortic valve stenosis 06/05/2016   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[facility] 06/05/2016   Environmental and seasonal allergies 04/19/2016   Insomnia 04/19/2016   Bicuspid aortic valve 04/19/2016   Asthma 04/19/2016  Aortic regurgitation 04/19/2016   Type II diabetes mellitus with complication ([place]) 08/67/6195   Depression, major, single episode, in partial remission ([place]) 04/12/2016   Irritable bowel syndrome (IBS) 04/12/2016   Hepatic steatosis 04/12/2016   Multiple myeloma in remission ([place]) 04/02/2016   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[place]) 07/06/2015   Colitis 02/14/2013   Disequilibrium 01/14/2013    Past Surgical History:  Procedure Laterality Date   ABDOMINAL HYSTERECTOMY     Auto Stem Cell transplant   06/2015   CARDIAC ELECTROPHYSIOLOGY MAPPING AND ABLATION     CARPAL TUNNEL RELEASE Bilateral    CHOLECYSTECTOMY  2008   COLONOSCOPY WITH PROPOFOL N/A 05/07/2017   Procedure: COLONOSCOPY WITH PROPOFOL;  Surgeon: [name], MD;  Location[facility] ENDOSCOPY;  Service: Gastroenterology;  Laterality: N/A;   ESOPHAGOGASTRODUODENOSCOPY (EGD) WITH PROPOFOL N/A 05/07/2017   Procedure: ESOPHAGOGASTRODUODENOSCOPY (EGD) WITH PROPOFOL;  Surgeon: [name], MD;  Location: [facility] ENDOSCOPY;  Service: Gastroenterology;  Laterality: N/A;   FOOT SURGERY Bilateral    INCONTINENCE SURGERY  2009   PARTIAL HYSTERECTOMY  03/1996   fibroids   TONSILLECTOMY  2007    OB History   No obstetric history on file.      Home Medications    Prior to Admission medications   Medication Sig Start Date End Date Taking? Authorizing Provider  allopurinol (ZYLOPRIM) 100 MG tablet TAKE 1 TABLET(100 MG) BY MOUTH DAILY as needed 12/02/18  Yes Glean Hess, MD  aspirin 81 MG chewable tablet Chew 1 tablet (81 mg total) by mouth daily. 07/27/17  Yes Gladstone Lighter, MD  bisoprolol (ZEBETA) 10 MG tablet TAKE 1 TABLET(10 MG) BY MOUTH DAILY 04/16/18  Yes Glean Hess, MD  diphenoxylate-atropine (LOMOTIL) 2.5-0.025 MG tablet Take 2 tablets by mouth daily as needed for diarrhea or loose stools. 04/14/18  Yes Cammie Sickle, MD  fentaNYL (DURAGESIC - DOSED MCG/HR) 25 MCG/HR patch Place 25 mcg onto the skin every 3 (three) days.  08/12/16  Yes [provider]  fexofenadine (ALLEGRA) 180 MG tablet TAKE 1 TABLET(180 MG) BY MOUTH DAILY Patient taking differently: as needed.  05/30/17  Yes Glean Hess, MD  FLUoxetine (PROZAC) 40 MG capsule Take 1 capsule (40 mg total) by mouth daily. 01/04/19  Yes Glean Hess, MD  fluticasone [facility]) 50 MCG/ACT nasal spray Place 2 sprays into both nostrils daily. Patient taking differently: Place 2 sprays into both nostrils as needed.  07/10/18  Yes Glean Hess, MD  furosemide (LASIX) 20 MG tablet Take 20 mg by mouth as needed.  12/09/18  Yes [provider]  glucose blood (ONE TOUCH ULTRA TEST) test strip  09/04/15  Yes [provider]  hydrOXYzine (ATARAX/VISTARIL) 10 MG tablet TAKE 1 TABLET(10 MG) BY MOUTH THREE TIMES DAILY AS NEEDED 05/30/17  Yes Glean Hess, MD  lisinopril (PRINIVIL,ZESTRIL) 10 MG tablet Take 10 mg by mouth daily.   Yes [provider]  Magnesium Bisglycinate (MAG GLYCINATE) 100 MG TABS Take 100 mg by mouth 2 (two) times daily. 08/20/18  Yes Karen Kitchens, NP  MAGNESIUM CL-CALCIUM CARBONATE PO Take 1 tablet by mouth daily.   Yes [provider]  Menaquinone-7 (VITAMIN K2) 100 MCG CAPS Take 1 tablet by mouth daily.    Yes [provider]  metFORMIN (GLUCOPHAGE-XR) 500 MG 24 hr tablet Take 1 tablet (500 mg total) by mouth daily with breakfast. 12/02/18  Yes Glean Hess, MD  Misc Natural Products (OSTEO BI-FLEX ADV TRIPLE ST  PO) Take 2 tablets by mouth daily.   Yes [provider]  Multiple Vitamins-Minerals (HAIR/SKIN/NAILS/BIOTIN PO) Take 1 capsule by mouth 3 (three) times daily.   Yes [provider]  omeprazole (PRILOSEC) 40 MG capsule Take 1 capsule (40 mg total) by mouth daily. 12/11/18  Yes Glean Hess, MD  pravastatin (PRAVACHOL) 20 MG tablet TAKE 1 TABLET BY MOUTH  DAILY 12/02/18  Yes Glean Hess, MD  promethazine (PHENERGAN) 25 MG tablet TAKE 1 TABLET BY MOUTH TWICE DAILY BEFORE MEALS AS NEEDED 01/06/19  Yes Corcoran, Drue Second, MD  tiZANidine (ZANAFLEX) 4 MG tablet Take 1 tablet (4 mg total) by mouth every 8 (eight) hours as needed for muscle spasms. 12/02/18  Yes Glean Hess, MD  traZODone (DESYREL) 100 MG tablet Take 1 tablet (100 mg total) by mouth at bedtime. For sleep. 12/11/18  Yes Glean Hess, MD  celecoxib (CELEBREX) 100 MG capsule TAKE 1 CAPSULE(100 MG) BY MOUTH DAILY 11/25/18   Glean Hess, MD  diazepam (VALIUM) 2 MG  tablet Take 1 tablet (2 mg total) by mouth every 12 (twelve) hours as needed for anxiety. 10/28/18   Glean Hess, MD  diclofenac sodium (VOLTAREN) 1 % GEL Apply 2 g topically 4 (four) times daily. 01/25/19   Gertie Baron, NP  NON FORMULARY     [provider]  Omega 3-6-9 Fatty Acids (OMEGA 3-6-9 COMPLEX) CAPS Take by mouth.    [provider]  oxybutynin (DITROPAN XL) 10 MG 24 hr tablet Take 1 tablet (10 mg total) by mouth at bedtime. 01/04/19   Bjorn Loser, MD    Family History Family History  Problem Relation Age of Onset   Colon cancer Father    Renal Disease Father    Diabetes Mellitus II Father    Melanoma Paternal Grandmother    Breast cancer Maternal Aunt 34   Anemia Mother    Heart disease Mother    Heart failure Mother    Renal Disease Mother    Congestive Heart Failure Mother    Heart disease Maternal Uncle    Throat cancer Maternal Uncle    Lung cancer Maternal Uncle    Liver disease Maternal Uncle    Heart failure Maternal Uncle    Hearing loss Son 60       Suicide     Social History Social History   Tobacco Use   Smoking status: Former Smoker    Packs/day: 1.00    Years: 20.00    Pack years: 20.00    Types: Cigarettes    Quit date: 07/02/1991    Years since quitting: 27.5   Smokeless tobacco: Never Used  Substance Use Topics   Alcohol use: Not Currently   Drug use: No     Allergies   Oxycodone-acetaminophen, Celebrex [celecoxib], Codeine, Benadryl [diphenhydramine], Morphine, Ondansetron, and Tylenol [acetaminophen]   Review of Systems Review of Systems  Constitutional: Negative for appetite change, fatigue and fever.  Respiratory: Positive for shortness of breath. Negative for cough and chest tightness.   Musculoskeletal: Positive for arthralgias. Negative for gait problem and neck stiffness.  Neurological: Negative for dizziness, weakness, numbness and headaches.  Psychiatric/Behavioral: Negative  for agitation, confusion, decreased concentration and sleep disturbance. The patient is not nervous/anxious.      Physical Exam Triage Vital Signs ED Triage Vitals  Enc Vitals Group     BP 01/25/19 1209 129/64     Pulse Rate 01/25/19 1209 (!) 55  Resp 01/25/19 1209 17     Temp 01/25/19 1209 98.1 F (36.7 C)     Temp Source 01/25/19 1209 Oral     SpO2 01/25/19 1209 100 %     Weight 01/25/19 1206 160 lb (72.6 kg)     Height 01/25/19 1206 5' 2" (1.575 m)     Head Circumference --      Peak Flow --      Pain Score 01/25/19 1206 7     Pain Loc --      Pain Edu? --      Excl. in [place]? --    No data found.  Updated Vital Signs BP 129/64 (BP Location: Right Arm)    Pulse (!) 55    Temp 98.1 F (36.7 C) (Oral)    Resp 17    Ht 5' 2" (1.575 m)    Wt 160 lb (72.6 kg)    SpO2 100%    BMI 29.26 kg/m   Visual Acuity Right Eye Distance:   Left Eye Distance:   Bilateral Distance:    Right Eye Near:   Left Eye Near:    Bilateral Near:     Physical Exam Vitals signs and nursing note reviewed.  Constitutional:      General: She is not in acute distress.    Appearance: She is well-developed.  HENT:     Head: Normocephalic and atraumatic.  Eyes:     Conjunctiva/sclera: Conjunctivae normal.  Neck:     Musculoskeletal: Neck supple.  Cardiovascular:     Rate and Rhythm: Normal rate and regular rhythm.     Heart sounds: No murmur.  Pulmonary:     Effort: Pulmonary effort is normal. No respiratory distress.     Breath sounds: Normal breath sounds.  Chest:     Chest wall: Tenderness present. No deformity or edema.     Comments: Tenderness to posterior ribs 5-9  No ecchymosis seen to area of tenderness Abdominal:     Palpations: Abdomen is soft.     Tenderness: There is no abdominal tenderness.  Skin:    General: Skin is warm and dry.  Neurological:     Mental Status: She is alert.      UC Treatments / Results  Labs (all labs ordered are listed, but only abnormal  results are displayed) Labs Reviewed - No data to display  EKG   Radiology Dg Ribs Unilateral W/chest Right  Result Date: 01/25/2019 CLINICAL DATA:  Pain following recent fall EXAM: RIGHT RIBS AND CHEST - 3+ VIEW COMPARISON:  Chest radiograph January 04, 2019 FINDINGS: Frontal chest as well as oblique and cone-down rib images were obtained. Lungs are clear. Heart size and pulmonary vascularity are normal. No adenopathy. There is no demonstrable rib fracture. No pneumothorax or pleural effusion. There is degenerative change in the thoracic spine. IMPRESSION: No evident rib fracture.  Lungs clear.  No pneumothorax. Electronically Signed   By: Lowella Grip III M.D.   On: 01/25/2019 13:18    Procedures Procedures (including critical care time)  Medications Ordered in UC Medications - No data to display  Initial Impression / Assessment and Plan / UC Course  I have reviewed the triage vital signs and the nursing notes.  Pertinent labs & imaging results that were available during my care of the patient were reviewed by me and considered in my medical decision making (see chart for details).     Pt presents with rib pain s/p fall, diagnosed with right  rib contusion and treated as such with the prescribed medications below. Pt given information on furthers symptomatic treatment. Pt told to follow-up with PCP if pain does not resolve or if symport change; may ned further imaging.   Final Clinical Impressions(s) / UC Diagnoses   Final diagnoses:  Rib contusion, right, initial encounter   Discharge Instructions   None    ED Prescriptions    Medication Sig Dispense Auth. Provider   diclofenac sodium (VOLTAREN) 1 % GEL Apply 2 g topically 4 (four) times daily. 100 g Gertie Baron, NP     Controlled Substance Prescriptions [place] Controlled Substance Registry consulted? Not Applicable    Gertie Baron, DNP, NP-c    Gertie Baron, NP 01/25/19 [phone]

## 2019-01-25 NOTE — ED Triage Notes (Signed)
Patient states that she had an allergic reaction to Oxybutnin and had a "black out" episode on Thursday pm and she is unsure of how long she was out for. Patient states that she must have hit her right side, she has continued to have side and back pain.

## 2019-01-26 ENCOUNTER — Telehealth: Payer: Self-pay | Admitting: Urology

## 2019-01-26 MED ORDER — TOLTERODINE TARTRATE ER 4 MG PO CP24: 4.0000 mg | ORAL_CAPSULE | Freq: Every day | ORAL | 1 refills | Status: DC

## 2019-01-26 MED ORDER — TOLTERODINE TARTRATE ER 4 MG PO CP24: 4.0000 mg | ORAL_CAPSULE | Freq: Every day | ORAL | 0 refills | Status: DC

## 2019-01-26 NOTE — Telephone Encounter (Addendum)
Informed patient-sent to pharmacy requested.

## 2019-01-26 NOTE — Telephone Encounter (Signed)
Patient notified 1 month supply sent in as requested.

## 2019-01-26 NOTE — Telephone Encounter (Signed)
detrol LA 4 mg daily

## 2019-01-26 NOTE — Telephone Encounter (Signed)
Pt would like to have only a 1 month supply of Detrol sent to [facility] in [place].  She doesn't want to get a 90 day supply until she is sure it is going to work.  Dr Matilde Sprang sent a 90 day supply in yesterday.

## 2019-01-29 ENCOUNTER — Other Ambulatory Visit: Payer: Self-pay

## 2019-02-01 ENCOUNTER — Inpatient Hospital Stay: Payer: Medicare PPO

## 2019-02-01 ENCOUNTER — Other Ambulatory Visit: Payer: Self-pay

## 2019-02-01 ENCOUNTER — Inpatient Hospital Stay: Payer: Medicare PPO | Attending: Hematology and Oncology

## 2019-02-01 VITALS — BP 114/73 | HR 71 | Temp 97.7°F | Resp 18

## 2019-02-01 DIAGNOSIS — R252 Cramp and spasm: Secondary | ICD-10-CM | POA: Diagnosis not present

## 2019-02-01 DIAGNOSIS — Z7984 Long term (current) use of oral hypoglycemic drugs: Secondary | ICD-10-CM | POA: Diagnosis not present

## 2019-02-01 DIAGNOSIS — M879 Osteonecrosis, unspecified: Secondary | ICD-10-CM | POA: Diagnosis not present

## 2019-02-01 DIAGNOSIS — E1142 Type 2 diabetes mellitus with diabetic polyneuropathy: Secondary | ICD-10-CM | POA: Diagnosis not present

## 2019-02-01 DIAGNOSIS — C9000 Multiple myeloma not having achieved remission: Secondary | ICD-10-CM | POA: Diagnosis not present

## 2019-02-01 DIAGNOSIS — Z888 Allergy status to other drugs, medicaments and biological substances status: Secondary | ICD-10-CM | POA: Insufficient documentation

## 2019-02-01 DIAGNOSIS — K5900 Constipation, unspecified: Secondary | ICD-10-CM | POA: Insufficient documentation

## 2019-02-01 DIAGNOSIS — Z885 Allergy status to narcotic agent status: Secondary | ICD-10-CM | POA: Insufficient documentation

## 2019-02-01 DIAGNOSIS — R110 Nausea: Secondary | ICD-10-CM | POA: Insufficient documentation

## 2019-02-01 DIAGNOSIS — Z79899 Other long term (current) drug therapy: Secondary | ICD-10-CM | POA: Insufficient documentation

## 2019-02-01 DIAGNOSIS — F329 Major depressive disorder, single episode, unspecified: Secondary | ICD-10-CM | POA: Insufficient documentation

## 2019-02-01 DIAGNOSIS — Z886 Allergy status to analgesic agent status: Secondary | ICD-10-CM | POA: Diagnosis not present

## 2019-02-01 DIAGNOSIS — R197 Diarrhea, unspecified: Secondary | ICD-10-CM | POA: Insufficient documentation

## 2019-02-01 DIAGNOSIS — G8929 Other chronic pain: Secondary | ICD-10-CM | POA: Insufficient documentation

## 2019-02-01 DIAGNOSIS — I129 Hypertensive chronic kidney disease with stage 1 through stage 4 chronic kidney disease, or unspecified chronic kidney disease: Secondary | ICD-10-CM | POA: Insufficient documentation

## 2019-02-01 DIAGNOSIS — E1122 Type 2 diabetes mellitus with diabetic chronic kidney disease: Secondary | ICD-10-CM | POA: Insufficient documentation

## 2019-02-01 DIAGNOSIS — M549 Dorsalgia, unspecified: Secondary | ICD-10-CM | POA: Diagnosis not present

## 2019-02-01 DIAGNOSIS — Z9481 Bone marrow transplant status: Secondary | ICD-10-CM | POA: Insufficient documentation

## 2019-02-01 DIAGNOSIS — Z9484 Stem cells transplant status: Secondary | ICD-10-CM | POA: Insufficient documentation

## 2019-02-01 DIAGNOSIS — Z87891 Personal history of nicotine dependence: Secondary | ICD-10-CM | POA: Insufficient documentation

## 2019-02-01 DIAGNOSIS — Z833 Family history of diabetes mellitus: Secondary | ICD-10-CM | POA: Insufficient documentation

## 2019-02-01 DIAGNOSIS — M1990 Unspecified osteoarthritis, unspecified site: Secondary | ICD-10-CM | POA: Diagnosis not present

## 2019-02-01 DIAGNOSIS — C9001 Multiple myeloma in remission: Secondary | ICD-10-CM

## 2019-02-01 DIAGNOSIS — E538 Deficiency of other specified B group vitamins: Secondary | ICD-10-CM | POA: Diagnosis not present

## 2019-02-01 DIAGNOSIS — D649 Anemia, unspecified: Secondary | ICD-10-CM | POA: Diagnosis not present

## 2019-02-01 DIAGNOSIS — N183 Chronic kidney disease, stage 3 (moderate): Secondary | ICD-10-CM | POA: Diagnosis not present

## 2019-02-01 LAB — COMPREHENSIVE METABOLIC PANEL
ALT: 38 U/L (ref 0–44)
AST: 34 U/L (ref 15–41)
Albumin: 4.3 g/dL (ref 3.5–5.0)
Alkaline Phosphatase: 95 U/L (ref 38–126)
Anion gap: 10 (ref 5–15)
BUN: 28 mg/dL — ABNORMAL HIGH (ref 8–23)
CO2: 19 mmol/L — ABNORMAL LOW (ref 22–32)
Calcium: 9.4 mg/dL (ref 8.9–10.3)
Chloride: 105 mmol/L (ref 98–111)
Creatinine, Ser: 1.18 mg/dL — ABNORMAL HIGH (ref 0.44–1.00)
GFR calc Af Amer: 57 mL/min — ABNORMAL LOW (ref >60)
GFR calc non Af Amer: 49 mL/min — ABNORMAL LOW (ref >60)
Glucose, Bld: 174 mg/dL — ABNORMAL HIGH (ref 70–99)
Potassium: 4.4 mmol/L (ref 3.5–5.1)
Sodium: 134 mmol/L — ABNORMAL LOW (ref 135–145)
Total Bilirubin: 0.2 mg/dL — ABNORMAL LOW (ref 0.3–1.2)
Total Protein: 7 g/dL (ref 6.5–8.1)

## 2019-02-01 LAB — CBC WITH DIFFERENTIAL/PLATELET
Abs Immature Granulocytes: 0.02 10*3/uL (ref 0.00–0.07)
Basophils Absolute: 0 10*3/uL (ref 0.0–0.1)
Basophils Relative: 0 %
Eosinophils Absolute: 0.1 10*3/uL (ref 0.0–0.5)
Eosinophils Relative: 2 %
HCT: 32.5 % — ABNORMAL LOW (ref 36.0–46.0)
Hemoglobin: 11.4 g/dL — ABNORMAL LOW (ref 12.0–15.0)
Immature Granulocytes: 0 %
Lymphocytes Relative: 22 %
Lymphs Abs: 1.2 10*3/uL (ref 0.7–4.0)
MCH: 30.2 pg (ref 26.0–34.0)
MCHC: 35.1 g/dL (ref 30.0–36.0)
MCV: 86 fL (ref 80.0–100.0)
Monocytes Absolute: 0.4 10*3/uL (ref 0.1–1.0)
Monocytes Relative: 8 %
Neutro Abs: 3.6 10*3/uL (ref 1.7–7.7)
Neutrophils Relative %: 68 %
Platelets: 112 10*3/uL — ABNORMAL LOW (ref 150–400)
RBC: 3.78 MIL/uL — ABNORMAL LOW (ref 3.87–5.11)
RDW: 13.2 % (ref 11.5–15.5)
WBC: 5.3 10*3/uL (ref 4.0–10.5)
nRBC: 0 % (ref 0.0–0.2)

## 2019-02-01 LAB — MAGNESIUM: Magnesium: 1.1 mg/dL — ABNORMAL LOW (ref 1.7–2.4)

## 2019-02-01 MED ORDER — SODIUM CHLORIDE 0.9 % IV SOLN
Freq: Once | INTRAVENOUS | Status: AC
  Administered 2019-02-01: 09:00:00 via INTRAVENOUS
  Filled 2019-02-01: qty 250

## 2019-02-01 MED ORDER — MAGNESIUM SULFATE 4 GM/100ML IV SOLN
4.0000 g | Freq: Once | INTRAVENOUS | Status: AC
  Administered 2019-02-01: 4 g via INTRAVENOUS

## 2019-02-01 NOTE — Progress Notes (Signed)
Patient instructed to come back Thursday at 1pm for labs and to see if she needs additional Magnesium.  Patient verbalized understanding.  Message sent to Shirlean Mylar to schedule patient.  MD aware.

## 2019-02-01 NOTE — Patient Instructions (Signed)
Magnesium Sulfate injection What is this medicine? MAGNESIUM SULFATE (mag NEE zee um SUL fate) is an electrolyte injection commonly used to treat low magnesium levels in your blood. It is also used to prevent or control seizures in women with preeclampsia or eclampsia. This medicine may be used for other purposes; ask your health care provider or pharmacist if you have questions. What should I tell my health care provider before I take this medicine? They need to know if you have any of these conditions:  heart disease  history of irregular heart beat  kidney disease  an unusual or allergic reaction to magnesium sulfate, medicines, foods, dyes, or preservatives  pregnant or trying to get pregnant  breast-feeding How should I use this medicine? This medicine is for infusion into a vein. It is given by a health care professional in a hospital or clinic setting. Talk to your pediatrician regarding the use of this medicine in children. While this drug may be prescribed for selected conditions, precautions do apply. Overdosage: If you think you have taken too much of this medicine contact a poison control center or emergency room at once. NOTE: This medicine is only for you. Do not share this medicine with others. What if I miss a dose? This does not apply. What may interact with this medicine? This medicine may interact with the following medications:  certain medicines for anxiety or sleep  certain medicines for seizures like phenobarbital  digoxin  medicines that relax muscles for surgery  narcotic medicines for pain This list may not describe all possible interactions. Give your health care provider a list of all the medicines, herbs, non-prescription drugs, or dietary supplements you use. Also tell them if you smoke, drink alcohol, or use illegal drugs. Some items may interact with your medicine. What should I watch for while using this medicine? Your condition will be  monitored carefully while you are receiving this medicine. You may need blood work done while you are receiving this medicine. What side effects may I notice from receiving this medicine? Side effects that you should report to your doctor or health care professional as soon as possible:  allergic reactions like skin rash, itching or hives, swelling of the face, lips, or tongue  facial flushing  muscle weakness  signs and symptoms of low blood pressure like dizziness; feeling faint or lightheaded, falls; unusually weak or tired  signs and symptoms of a dangerous change in heartbeat or heart rhythm like chest pain; dizziness; fast or irregular heartbeat; palpitations; breathing problems  sweating This list may not describe all possible side effects. Call your doctor for medical advice about side effects. You may report side effects to FDA at 1-800-FDA-1088. Where should I keep my medicine? This drug is given in a hospital or clinic and will not be stored at home. NOTE: This sheet is a summary. It may not cover all possible information. If you have questions about this medicine, talk to your doctor, pharmacist, or health care provider.  2020 Elsevier/Gold Standard (2016-01-03 12:31:42)  

## 2019-02-02 LAB — PROTEIN ELECTROPHORESIS, SERUM
A/G Ratio: 1.7 (ref 0.7–1.7)
Albumin ELP: 4 g/dL (ref 2.9–4.4)
Alpha-1-Globulin: 0.2 g/dL (ref 0.0–0.4)
Alpha-2-Globulin: 0.8 g/dL (ref 0.4–1.0)
Beta Globulin: 0.9 g/dL (ref 0.7–1.3)
Gamma Globulin: 0.5 g/dL (ref 0.4–1.8)
Globulin, Total: 2.4 g/dL (ref 2.2–3.9)
Total Protein ELP: 6.4 g/dL (ref 6.0–8.5)

## 2019-02-02 LAB — KAPPA/LAMBDA LIGHT CHAINS
Kappa free light chain: 15.7 mg/L (ref 3.3–19.4)
Kappa, lambda light chain ratio: 0.07 — ABNORMAL LOW (ref 0.26–1.65)
Lambda free light chains: 236.6 mg/L — ABNORMAL HIGH (ref 5.7–26.3)

## 2019-02-04 ENCOUNTER — Other Ambulatory Visit: Payer: Self-pay

## 2019-02-04 ENCOUNTER — Other Ambulatory Visit: Payer: Self-pay | Admitting: Hematology and Oncology

## 2019-02-04 ENCOUNTER — Inpatient Hospital Stay: Payer: Medicare PPO

## 2019-02-04 DIAGNOSIS — C9001 Multiple myeloma in remission: Secondary | ICD-10-CM

## 2019-02-04 DIAGNOSIS — E538 Deficiency of other specified B group vitamins: Secondary | ICD-10-CM

## 2019-02-04 DIAGNOSIS — E612 Magnesium deficiency: Secondary | ICD-10-CM

## 2019-02-04 DIAGNOSIS — R110 Nausea: Secondary | ICD-10-CM

## 2019-02-04 DIAGNOSIS — G620 Drug-induced polyneuropathy: Secondary | ICD-10-CM

## 2019-02-04 DIAGNOSIS — T451X5A Adverse effect of antineoplastic and immunosuppressive drugs, initial encounter: Secondary | ICD-10-CM

## 2019-02-04 DIAGNOSIS — C9000 Multiple myeloma not having achieved remission: Secondary | ICD-10-CM | POA: Diagnosis not present

## 2019-02-04 LAB — MAGNESIUM: Magnesium: 1.4 mg/dL — ABNORMAL LOW (ref 1.7–2.4)

## 2019-02-04 MED ORDER — SODIUM CHLORIDE 0.9 % IV SOLN
Freq: Once | INTRAVENOUS | Status: AC
  Administered 2019-02-04: 14:00:00 via INTRAVENOUS
  Filled 2019-02-04: qty 250

## 2019-02-04 MED ORDER — SODIUM CHLORIDE 0.9 % IV SOLN
3.0000 g | Freq: Once | INTRAVENOUS | Status: AC
  Administered 2019-02-04: 3 g via INTRAVENOUS
  Filled 2019-02-04: qty 6

## 2019-02-04 NOTE — Patient Instructions (Signed)
Magnesium Sulfate injection What is this medicine? MAGNESIUM SULFATE (mag NEE zee um SUL fate) is an electrolyte injection commonly used to treat low magnesium levels in your blood. It is also used to prevent or control seizures in women with preeclampsia or eclampsia. This medicine may be used for other purposes; ask your health care provider or pharmacist if you have questions. What should I tell my health care provider before I take this medicine? They need to know if you have any of these conditions:  heart disease  history of irregular heart beat  kidney disease  an unusual or allergic reaction to magnesium sulfate, medicines, foods, dyes, or preservatives  pregnant or trying to get pregnant  breast-feeding How should I use this medicine? This medicine is for infusion into a vein. It is given by a health care professional in a hospital or clinic setting. Talk to your pediatrician regarding the use of this medicine in children. While this drug may be prescribed for selected conditions, precautions do apply. Overdosage: If you think you have taken too much of this medicine contact a poison control center or emergency room at once. NOTE: This medicine is only for you. Do not share this medicine with others. What if I miss a dose? This does not apply. What may interact with this medicine? This medicine may interact with the following medications:  certain medicines for anxiety or sleep  certain medicines for seizures like phenobarbital  digoxin  medicines that relax muscles for surgery  narcotic medicines for pain This list may not describe all possible interactions. Give your health care provider a list of all the medicines, herbs, non-prescription drugs, or dietary supplements you use. Also tell them if you smoke, drink alcohol, or use illegal drugs. Some items may interact with your medicine. What should I watch for while using this medicine? Your condition will be  monitored carefully while you are receiving this medicine. You may need blood work done while you are receiving this medicine. What side effects may I notice from receiving this medicine? Side effects that you should report to your doctor or health care professional as soon as possible:  allergic reactions like skin rash, itching or hives, swelling of the face, lips, or tongue  facial flushing  muscle weakness  signs and symptoms of low blood pressure like dizziness; feeling faint or lightheaded, falls; unusually weak or tired  signs and symptoms of a dangerous change in heartbeat or heart rhythm like chest pain; dizziness; fast or irregular heartbeat; palpitations; breathing problems  sweating This list may not describe all possible side effects. Call your doctor for medical advice about side effects. You may report side effects to FDA at 1-800-FDA-1088. Where should I keep my medicine? This drug is given in a hospital or clinic and will not be stored at home. NOTE: This sheet is a summary. It may not cover all possible information. If you have questions about this medicine, talk to your doctor, pharmacist, or health care provider.  2020 Elsevier/Gold Standard (2016-01-03 12:31:42)  

## 2019-02-05 NOTE — Progress Notes (Signed)
[facility]  [address], [address] [place], [place] [address] Phone: [phone]  Fax: [phone]   Clinic Day:  02/08/2019  Referring physician: Glean Hess, MD  Chief Complaint: Sarah [name] is a 64 y.o. 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  HPI: The patient was last seen in the medical oncology clinic on 12/17/2018. At that time, she had chronic nausea s/p cholecystectomy. She noted recent constipation then diarrhea. She denied any infections. She denied any bone pain except for back discomfort from spinal stenosis.  Creatinine was 1.07.  Magnesium 1.1.  AST 49 and ALT 53.  Labs followed: 12/21/2018: Magnesium 1.1.  Creatinine 1.60.  12/28/2018: Magnesium 1.1.  Creatinine 1.51.  01/04/2019: Magnesium 1.3. 01/11/2019: Magnesium 1.4. 01/25/2019: Magnesium 1.2. 02/01/2019: Magnesium 1.1. 02/04/2019: Magnesium 1.4.   She received magnesium sulfate IV 4 gm on 12/21/2018, 12/28/2018, 01/04/2019, 01/11/2019, 01/25/2019, 02/01/2019, and 3 gm IV on 02/04/2019.   She was seen in Urology on 01/04/2019 by Bjorn Loser for incontinence. She was started oxybutynin ER 10 mg daily.   She presented to the [facility] for loss of consciousness following an allergic reaction to oxybutynin on 01/25/2019. CXR was negative. She was discharged with Voltaren gel.   Labs on 02/01/2019 revealed a hematocrit 32.5, hemoglobin 11.4, MCV 86.0, platelets 112,000, WBC 5,300. Sodium was 134, CO2 19, BUN 28, Creatinine 1.18. Magnesium was 1.1. M-spike was 0. Lambda light chain 236.6 (ratio 0.07).   During the interim, she has been "hanging in there." She notes her nausea has worsened since her gallbladder surgery and starting a new incontinence medication (tolterodine). She wakes up with nausea and has no appetite. She has been forcing herself to eat.   She continues to  have alternating constipation and diarrhea. She has cramps in her legs, worse at night.    Past Medical History:  Diagnosis Date   Abnormal stress test 02/14/2016   Overview:  Added automatically from request for surgery [id number]   Anemia    Anxiety    Arthritis    Bicuspid aortic valve    CHF (congestive heart failure) (HCC)    CKD (chronic kidney disease) stage 3, GFR 30-59 ml/min (HCC)    Depression    Diabetes mellitus (HCC)    Dizziness    Fatty liver    Frequent falls    GERD (gastroesophageal reflux disease)    Gout    Heart murmur    History of blood transfusion    History of bone marrow transplant ([place])    History of uterine fibroid    Hx of cardiac catheterization 06/05/2016   Overview:  Normal coronaries 2017   Hypertension    Hypomagnesemia    Multiple myeloma ([place])    Personal history of chemotherapy    Renal cyst     Past Surgical History:  Procedure Laterality Date   ABDOMINAL HYSTERECTOMY     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   CARDIAC ELECTROPHYSIOLOGY MAPPING AND ABLATION     CARPAL TUNNEL RELEASE Bilateral    CHOLECYSTECTOMY  2008   COLONOSCOPY WITH PROPOFOL N/A 05/07/2017   Procedure: COLONOSCOPY WITH PROPOFOL;  Surgeon: [name], MD;  Location: [facility] ENDOSCOPY;  Service: Gastroenterology;  Laterality: N/A;   ESOPHAGOGASTRODUODENOSCOPY (EGD) WITH PROPOFOL N/A 05/07/2017   Procedure: ESOPHAGOGASTRODUODENOSCOPY (EGD) WITH PROPOFOL;  Surgeon: [name], MD;  Location: [facility] ENDOSCOPY;  Service: Gastroenterology;  Laterality: N/A;   FOOT  SURGERY Bilateral    INCONTINENCE SURGERY  2009   PARTIAL HYSTERECTOMY  03/1996   fibroids   TONSILLECTOMY  2007    Family History  Problem Relation Age of Onset   Colon cancer Father    Renal Disease Father    Diabetes Mellitus II Father    Melanoma Paternal Grandmother    Breast cancer Maternal Aunt 74   Anemia Mother    Heart disease Mother    Heart failure Mother     Renal Disease Mother    Congestive Heart Failure Mother    Heart disease Maternal Uncle    Throat cancer Maternal Uncle    Lung cancer Maternal Uncle    Liver disease Maternal Uncle    Heart failure Maternal Uncle    Hearing loss Son 43       Suicide     Social History:  reports that she quit smoking about 27 years ago. Her smoking use included cigarettes. She has a 20.00 pack-year smoking history. She has never used smokeless tobacco. She reports previous alcohol use. She reports that she does not use drugs. Patient has not had ETOH in several months. She is on disability. She notes exposure to perchloroethylene [address]).  She lives in [place].  The patient is alone today.  Allergies:  Allergies  Allergen Reactions   Oxycodone-Acetaminophen Anaphylaxis    Swelling and rash   Celebrex [Celecoxib] Diarrhea   Codeine    Benadryl [Diphenhydramine] Palpitations   Morphine Itching and Rash   Ondansetron Diarrhea   Tylenol [Acetaminophen] Itching and Rash    Current Medications: Current Outpatient Medications  Medication Sig Dispense Refill   aspirin 81 MG chewable tablet Chew 1 tablet (81 mg total) by mouth daily. 30 tablet 0   bisoprolol (ZEBETA) 10 MG tablet TAKE 1 TABLET(10 MG) BY MOUTH DAILY 90 tablet 0   celecoxib (CELEBREX) 100 MG capsule TAKE 1 CAPSULE(100 MG) BY MOUTH DAILY 90 capsule 0   diclofenac sodium (VOLTAREN) 1 % GEL Apply 2 g topically 4 (four) times daily. 100 g 1   fentaNYL (DURAGESIC - DOSED MCG/HR) 25 MCG/HR patch Place 25 mcg onto the skin every 3 (three) days.      FLUoxetine (PROZAC) 40 MG capsule Take 1 capsule (40 mg total) by mouth daily. 90 capsule 1   furosemide (LASIX) 20 MG tablet Take 20 mg by mouth as needed.      glucose blood (ONE TOUCH ULTRA TEST) test strip      lisinopril (PRINIVIL,ZESTRIL) 10 MG tablet Take 10 mg by mouth daily.     Magnesium Bisglycinate (MAG GLYCINATE) 100 MG TABS Take 100 mg by mouth 2 (two) times  daily. 60 tablet 0   MAGNESIUM CL-CALCIUM CARBONATE PO Take 1 tablet by mouth daily.     Menaquinone-7 (VITAMIN K2) 100 MCG CAPS Take 1 tablet by mouth daily.      metFORMIN (GLUCOPHAGE-XR) 500 MG 24 hr tablet Take 1 tablet (500 mg total) by mouth daily with breakfast. 90 tablet 1   Misc Natural Products (OSTEO BI-FLEX ADV TRIPLE ST PO) Take 2 tablets by mouth daily.     Multiple Vitamins-Minerals (HAIR/SKIN/NAILS/BIOTIN PO) Take 1 capsule by mouth 3 (three) times daily.     NON FORMULARY      Omega 3-6-9 Fatty Acids (OMEGA 3-6-9 COMPLEX) CAPS Take by mouth.     omeprazole (PRILOSEC) 40 MG capsule Take 1 capsule (40 mg total) by mouth daily. 90 capsule 1   pravastatin (PRAVACHOL) 20 MG  tablet TAKE 1 TABLET BY MOUTH  DAILY 90 tablet 1   promethazine (PHENERGAN) 25 MG tablet TAKE 1 TABLET BY MOUTH TWICE DAILY BEFORE MEALS AS NEEDED 60 tablet 0   tiZANidine (ZANAFLEX) 4 MG tablet Take 1 tablet (4 mg total) by mouth every 8 (eight) hours as needed for muscle spasms. 270 tablet 1   tolterodine (DETROL LA) 4 MG 24 hr capsule Take 1 capsule (4 mg total) by mouth daily. 30 capsule 0   traZODone (DESYREL) 100 MG tablet Take 1 tablet (100 mg total) by mouth at bedtime. For sleep. 90 tablet 1   allopurinol (ZYLOPRIM) 100 MG tablet TAKE 1 TABLET(100 MG) BY MOUTH DAILY as needed (Patient not taking: Reported on 02/08/2019) 90 tablet 1   diazepam (VALIUM) 2 MG tablet Take 1 tablet (2 mg total) by mouth every 12 (twelve) hours as needed for anxiety. (Patient not taking: Reported on 02/08/2019) 60 tablet 0   diphenoxylate-atropine (LOMOTIL) 2.5-0.025 MG tablet Take 2 tablets by mouth daily as needed for diarrhea or loose stools. (Patient not taking: Reported on 02/08/2019) 45 tablet 2   fexofenadine (ALLEGRA) 180 MG tablet TAKE 1 TABLET(180 MG) BY MOUTH DAILY (Patient not taking: No sig reported) 30 tablet 5   fluticasone (FLONASE) 50 MCG/ACT nasal spray Place 2 sprays into both nostrils daily.  (Patient not taking: Reported on 02/08/2019) 16 g 2   hydrOXYzine (ATARAX/VISTARIL) 10 MG tablet TAKE 1 TABLET(10 MG) BY MOUTH THREE TIMES DAILY AS NEEDED (Patient not taking: Reported on 02/08/2019) 90 tablet 0   No current facility-administered medications for this visit.     Review of Systems  Constitutional: Positive for weight loss (5lbs). Negative for chills, diaphoresis, fever and malaise/fatigue.       She is "hanging in there."  HENT: Negative.  Negative for congestion, hearing loss, sinus pain and sore throat.   Eyes: Negative.  Negative for blurred vision.  Respiratory: Negative.  Negative for cough, shortness of breath and wheezing.   Cardiovascular: Negative.  Negative for chest pain, palpitations, orthopnea, leg swelling and PND.  Gastrointestinal: Positive for constipation, diarrhea, heartburn and nausea (chronic s/p gallbladder surgery, worsening). Negative for abdominal pain, blood in stool, melena and vomiting.       No appetite  Genitourinary: Negative for dysuria, frequency, hematuria and urgency.       Bladder leakage (f/u urology bladder sling).  Musculoskeletal: Positive for back pain (chronic, spinal stenosis and pinched nerve), joint pain (arthritis) and myalgias (leg cramps, worse at night). Negative for neck pain.  Skin: Negative.  Negative for rash.  Neurological: Negative.  Negative for dizziness, tingling, sensory change, weakness and headaches.  Endo/Heme/Allergies: Does not bruise/bleed easily.       Diabetes.   Psychiatric/Behavioral: Negative.  Negative for depression and memory loss. The patient is not nervous/anxious and does not have insomnia.   All other systems reviewed and are negative.  Performance status (ECOG): 1  Vitals Blood pressure 110/63, pulse 63, temperature 98.5 F (36.9 C), temperature source Tympanic, resp. rate 18, height 5' 2"  (1.575 m), weight 154 lb 8.7 oz (70.1 kg), SpO2 99 %.   Physical Exam  Constitutional: She is oriented to  person, place, and time. She appears well-developed and well-nourished. No distress.  HENT:  Head: Normocephalic and atraumatic.  Blonde hair pulled up.  Dry mouth.  Mask.  Eyes: Pupils are equal, round, and reactive to light. Conjunctivae and EOM are normal. No scleral icterus.  Glasses.  Neck: Normal range of motion.  Neck supple. No JVD present.  Cardiovascular: Normal rate, regular rhythm and normal heart sounds.  No murmur heard. Pulmonary/Chest: Effort normal and breath sounds normal. No respiratory distress. She has no wheezes.  Abdominal: Soft. Bowel sounds are normal. She exhibits no distension and no mass. There is no abdominal tenderness. There is no rebound and no guarding.  Musculoskeletal: Normal range of motion.        General: Tenderness (BLE) present. No edema.     Thoracic back: She exhibits tenderness (slight).  Lymphadenopathy:    She has no cervical adenopathy.    She has no axillary adenopathy.       Right: No supraclavicular adenopathy present.       Left: No supraclavicular adenopathy present.  Neurological: She is alert and oriented to person, place, and time.  Skin: Skin is warm and dry. No rash noted. She is not diaphoretic. No erythema.  Psychiatric: She has a normal mood and affect. Her behavior is normal. Judgment and thought content normal.  Nursing note and vitals reviewed.   Appointment on 02/08/2019  Component Date Value Ref Range Status   Magnesium 02/08/2019 1.4* 1.7 - 2.4 mg/dL Final   Performed at [facility], [address] [place], [place] [address]    Assessment:  Sarah [name] is a 63 y.o. 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 Bone marrow revealed 80% plasma cells. She initially underwent induction with RVD. Revlimid maintenance was discontinued on 01/21/2017 secondary to intolerance.   M-spikehas been followed: 0  on 04/02/2016 - 02/01/2019; and 0.2 on 09/02/2017.  Lambda light chainshave been followed: 22.2 (ratio 0.56) on 07/03/2017, 30.8 (ratio 0.78) on 09/02/2017, 36.9 (ratio 0.40) on 10/21/2017, 37.4 (ratio 0.41) on 12/16/2017, 70.7(ratio 0.31) on 02/17/2018, 64.2 (ratio 0.27) on 04/07/2018, 78.9 (ratio 0.18) on 05/26/2018, 128.8 (ratio 0.17) on 08/06/2018, 181.5 (ratio 0.13) on 10/08/2018, 130.9 (ratio 0.13) on 10/20/2018, 160.7 (ratio 0.10) on 12/09/2018, and 236.6 (ratio 0.07) on 02/01/2019.   Bone surveyon 04/08/2016 and11/28/2018 revealed no definite lytic lesion seen in the visualized skeleton. Bone survey on 11/19/2018 revealed no suspicious lucent lesions and no acute bony abnormality.  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 Zometa was discontinued in 01/2017.  She has B12 deficiency. B12 was 254 on 04/09/2017, 295 on 08/20/2018, and 391 on 10/08/2018. She is on oral B12.  Folate was 21.6 on 08/20/2018.   Symptomatically, her nausea has worsened since her gallbladder surgery.  She has chronic cramps in her legs at night.  Magnesium is 1.4.  Plan: 1.   Review labs from 02/01/2019. 2.   Labs today: Mg, B12, folate, ferritin, iron studies, retic count.  3. Stage III multiple myeloma Clinically, she is doing well.    She is status post autologous stem cell transplant in 06/2015.   She is off maintenance Revlimid secondary to intolerance.    M spike was 0 on 02/01/2019.             Lambda light chains were 236.6 (ratio 0.07) on 02/01/2019  Bone survey on 11/19/2018 revealed no lytic lesions. Continue close surveillance. 4. Normocytic anemia Hematocrit 32.5.  Hemoglobin 11.4.  MCV 86.0 on 02/01/2019. Ferritin 67 with an iron saturation of 19% and TIBC 340 on 02/08/2019.  B12 248 (low) and folate 18.6.    TSH was normal on 08/20/2018   Creatinine 1.18 (CrCl 45.3 ml/minute)  Retic  1.3%. Anemia likely multifactorial with some component of chronic kidney disease as well as B12 deficiency. 4. Stage III chronic kidney disease BUN 28. Creatinine 1.18 on 12/09/2018. Creatinine has fluctuated between 1.07 - 1.66 in the past 6 months.   She is followed by Dr. Holley Raring. 5. Hypomagnesemia, chronic Magnesium 1.4 today. Magnesium has fluctuated between 1.1 - 1.4 in the past 2 months.  Magnesium sulfate 4 gm IV today.   RTC weekly x 8 for labs (Mg) and+/- IV magnesium.  6. Osteonecrosis of the jaw ONJ occurred in 01/2017 secondary to Zometa. She receives nofurther bisphosphonates. 7. Chronic back pain Etiology is not secondary to multiple myeloma. Patient on Fentanyl patches per [facility] pain clinic. 8. Grade I-II peripheral neuropathy Etiology secondary to priorchemotherapy. Continue to monitor. 9.Chronic nausea Patient is followed by Dr. Vicente Males.             10 you Phenergan 25 mg p.o. twice daily before meals prn. 10.   Labs in 4 weeks (CBC and BMP). 11.   RTC in 8 weeks for MD assessment, labs (CBC with diff, CMP, magnesium, SPEP, FLCA) and +/-  IV magnesium.  I discussed the assessment and treatment plan with the patient.  The patient was provided an opportunity to ask questions and all were answered.  The patient agreed with the plan and demonstrated an understanding of the instructions.  The patient was advised to call back if the symptoms worsen or if the condition fails to improve as anticipated.   Lequita Asal, MD, PhD    02/08/2019, 10:27 AM  I, Molly Dorshimer, am acting as Education administrator for [employer]. Mike Gip, MD, PhD.  I, Charnay Nazario C. Mike Gip, MD, have reviewed the above documentation for accuracy and completeness, and I agree with the above.

## 2019-02-08 ENCOUNTER — Other Ambulatory Visit: Payer: Self-pay

## 2019-02-08 ENCOUNTER — Encounter: Payer: Self-pay | Admitting: Hematology and Oncology

## 2019-02-08 ENCOUNTER — Inpatient Hospital Stay: Payer: Medicare PPO

## 2019-02-08 ENCOUNTER — Inpatient Hospital Stay (HOSPITAL_BASED_OUTPATIENT_CLINIC_OR_DEPARTMENT_OTHER): Payer: Medicare PPO | Admitting: Hematology and Oncology

## 2019-02-08 ENCOUNTER — Other Ambulatory Visit: Payer: Self-pay | Admitting: Hematology and Oncology

## 2019-02-08 VITALS — BP 110/63 | HR 63 | Temp 98.5°F | Resp 18 | Ht 62.0 in | Wt 154.5 lb

## 2019-02-08 DIAGNOSIS — C9000 Multiple myeloma not having achieved remission: Secondary | ICD-10-CM | POA: Diagnosis not present

## 2019-02-08 DIAGNOSIS — C9001 Multiple myeloma in remission: Secondary | ICD-10-CM | POA: Diagnosis not present

## 2019-02-08 DIAGNOSIS — D649 Anemia, unspecified: Secondary | ICD-10-CM

## 2019-02-08 DIAGNOSIS — Z9484 Stem cells transplant status: Secondary | ICD-10-CM | POA: Diagnosis not present

## 2019-02-08 DIAGNOSIS — G620 Drug-induced polyneuropathy: Secondary | ICD-10-CM

## 2019-02-08 DIAGNOSIS — T451X5A Adverse effect of antineoplastic and immunosuppressive drugs, initial encounter: Secondary | ICD-10-CM

## 2019-02-08 DIAGNOSIS — E538 Deficiency of other specified B group vitamins: Secondary | ICD-10-CM | POA: Diagnosis not present

## 2019-02-08 LAB — VITAMIN B12: Vitamin B-12: 248 pg/mL (ref 180–914)

## 2019-02-08 LAB — RETICULOCYTES
Immature Retic Fract: 10 % (ref 2.3–15.9)
RBC.: 3.88 MIL/uL (ref 3.87–5.11)
Retic Count, Absolute: 50.1 10*3/uL (ref 19.0–186.0)
Retic Ct Pct: 1.3 % (ref 0.4–3.1)

## 2019-02-08 LAB — IRON AND TIBC
Iron: 64 ug/dL (ref 28–170)
Saturation Ratios: 19 % (ref 10.4–31.8)
TIBC: 340 ug/dL (ref 250–450)
UIBC: 277 ug/dL

## 2019-02-08 LAB — FERRITIN: Ferritin: 67 ng/mL (ref 11–307)

## 2019-02-08 LAB — MAGNESIUM: Magnesium: 1.4 mg/dL — ABNORMAL LOW (ref 1.7–2.4)

## 2019-02-08 LAB — FOLATE: Folate: 18.6 ng/mL (ref >5.9)

## 2019-02-08 MED ORDER — SODIUM CHLORIDE 0.9 % IV SOLN
Freq: Once | INTRAVENOUS | Status: AC
  Administered 2019-02-08: 11:00:00 via INTRAVENOUS
  Filled 2019-02-08: qty 250

## 2019-02-08 MED ORDER — SODIUM CHLORIDE 0.9% FLUSH
10.0000 mL | Freq: Once | INTRAVENOUS | Status: DC | PRN
  Filled 2019-02-08: qty 10

## 2019-02-08 MED ORDER — HEPARIN SOD (PORK) LOCK FLUSH 100 UNIT/ML IV SOLN: 250.0000 [IU] | Freq: Once | INTRAVENOUS | Status: DC | PRN

## 2019-02-08 MED ORDER — SODIUM CHLORIDE 0.9% FLUSH
3.0000 mL | Freq: Once | INTRAVENOUS | Status: DC | PRN
  Filled 2019-02-08: qty 3

## 2019-02-08 MED ORDER — SODIUM CHLORIDE 0.9 % IV SOLN
Freq: Once | INTRAVENOUS | Status: DC
  Filled 2019-02-08: qty 250

## 2019-02-08 MED ORDER — HEPARIN SOD (PORK) LOCK FLUSH 100 UNIT/ML IV SOLN: 500.0000 [IU] | Freq: Once | INTRAVENOUS | Status: DC | PRN

## 2019-02-08 MED ORDER — MAGNESIUM SULFATE 4 GM/100ML IV SOLN
INTRAVENOUS | Status: AC
  Filled 2019-02-08: qty 100

## 2019-02-08 MED ORDER — ALTEPLASE 2 MG IJ SOLR: 2.0000 mg | Freq: Once | INTRAMUSCULAR | Status: DC | PRN

## 2019-02-08 MED ORDER — MAGNESIUM SULFATE 4 GM/100ML IV SOLN
4.0000 g | Freq: Once | INTRAVENOUS | Status: AC
  Administered 2019-02-08: 4 g via INTRAVENOUS

## 2019-02-08 NOTE — Patient Instructions (Signed)
Magnesium Sulfate injection What is this medicine? MAGNESIUM SULFATE (mag NEE zee um SUL fate) is an electrolyte injection commonly used to treat low magnesium levels in your blood. It is also used to prevent or control seizures in women with preeclampsia or eclampsia. This medicine may be used for other purposes; ask your health care provider or pharmacist if you have questions. What should I tell my health care provider before I take this medicine? They need to know if you have any of these conditions:  heart disease  history of irregular heart beat  kidney disease  an unusual or allergic reaction to magnesium sulfate, medicines, foods, dyes, or preservatives  pregnant or trying to get pregnant  breast-feeding How should I use this medicine? This medicine is for infusion into a vein. It is given by a health care professional in a hospital or clinic setting. Talk to your pediatrician regarding the use of this medicine in children. While this drug may be prescribed for selected conditions, precautions do apply. Overdosage: If you think you have taken too much of this medicine contact a poison control center or emergency room at once. NOTE: This medicine is only for you. Do not share this medicine with others. What if I miss a dose? This does not apply. What may interact with this medicine? This medicine may interact with the following medications:  certain medicines for anxiety or sleep  certain medicines for seizures like phenobarbital  digoxin  medicines that relax muscles for surgery  narcotic medicines for pain This list may not describe all possible interactions. Give your health care provider a list of all the medicines, herbs, non-prescription drugs, or dietary supplements you use. Also tell them if you smoke, drink alcohol, or use illegal drugs. Some items may interact with your medicine. What should I watch for while using this medicine? Your condition will be  monitored carefully while you are receiving this medicine. You may need blood work done while you are receiving this medicine. What side effects may I notice from receiving this medicine? Side effects that you should report to your doctor or health care professional as soon as possible:  allergic reactions like skin rash, itching or hives, swelling of the face, lips, or tongue  facial flushing  muscle weakness  signs and symptoms of low blood pressure like dizziness; feeling faint or lightheaded, falls; unusually weak or tired  signs and symptoms of a dangerous change in heartbeat or heart rhythm like chest pain; dizziness; fast or irregular heartbeat; palpitations; breathing problems  sweating This list may not describe all possible side effects. Call your doctor for medical advice about side effects. You may report side effects to FDA at 1-800-FDA-1088. Where should I keep my medicine? This drug is given in a hospital or clinic and will not be stored at home. NOTE: This sheet is a summary. It may not cover all possible information. If you have questions about this medicine, talk to your doctor, pharmacist, or health care provider.  2020 Elsevier/Gold Standard (2016-01-03 12:31:42)  

## 2019-02-08 NOTE — Progress Notes (Signed)
Several nausea after starting new drug  For her bladder

## 2019-02-12 ENCOUNTER — Other Ambulatory Visit: Payer: Self-pay

## 2019-02-15 ENCOUNTER — Telehealth: Payer: Self-pay | Admitting: *Deleted

## 2019-02-15 ENCOUNTER — Encounter: Payer: Self-pay | Admitting: Urology

## 2019-02-15 ENCOUNTER — Other Ambulatory Visit: Payer: Self-pay

## 2019-02-15 ENCOUNTER — Inpatient Hospital Stay: Payer: Medicare PPO

## 2019-02-15 ENCOUNTER — Ambulatory Visit (INDEPENDENT_AMBULATORY_CARE_PROVIDER_SITE_OTHER): Payer: Medicare PPO | Admitting: Urology

## 2019-02-15 VITALS — BP 113/71 | HR 65 | Temp 97.6°F | Resp 18

## 2019-02-15 VITALS — BP 117/66 | HR 64 | Ht 62.0 in | Wt 154.0 lb

## 2019-02-15 DIAGNOSIS — C9000 Multiple myeloma not having achieved remission: Secondary | ICD-10-CM | POA: Diagnosis not present

## 2019-02-15 DIAGNOSIS — C9001 Multiple myeloma in remission: Secondary | ICD-10-CM

## 2019-02-15 DIAGNOSIS — N3946 Mixed incontinence: Secondary | ICD-10-CM | POA: Diagnosis not present

## 2019-02-15 DIAGNOSIS — K589 Irritable bowel syndrome without diarrhea: Secondary | ICD-10-CM

## 2019-02-15 LAB — MAGNESIUM: Magnesium: 1.1 mg/dL — ABNORMAL LOW (ref 1.7–2.4)

## 2019-02-15 MED ORDER — MAGNESIUM SULFATE 4 GM/100ML IV SOLN
4.0000 g | Freq: Once | INTRAVENOUS | Status: AC
  Administered 2019-02-15: 4 g via INTRAVENOUS

## 2019-02-15 MED ORDER — SODIUM CHLORIDE 0.9 % IV SOLN
Freq: Once | INTRAVENOUS | Status: AC
  Administered 2019-02-15: 15:00:00 via INTRAVENOUS
  Filled 2019-02-15: qty 250

## 2019-02-15 MED ORDER — CYANOCOBALAMIN 1000 MCG/ML IJ SOLN
1000.0000 ug | Freq: Once | INTRAMUSCULAR | Status: AC
  Administered 2019-02-15: 1000 ug via INTRAMUSCULAR
  Filled 2019-02-15: qty 1

## 2019-02-15 NOTE — Progress Notes (Signed)
02/15/2019 10:47 AM   Sarah [name] 09-17-1955 [id number]  Referring provider: Glean Hess, MD [address] [place],  [place] [address]  Chief Complaint  Patient presents with   mixed incontinence    HPI: I was consulted to assess the patient is urinary incontinence worsening the last year. She describes a sling in 2009.  She leaks with coughing sneezing at times and might with heavy lifting. She leaks more when she is active. She can sit and feel a bubble or small leak. Sometimes she has urge incontinence. She has mild bedwetting. She wears 2 or 3 pads a day depending on activity level and they are damp  She voids every 4 or 5 hours and gets up twice a night to urinate. She can have an urge and then cannot go well and will strain. She said the flow is slow and thick. She will stop and start. She will stand up and sedated again. She does not feel empty.  She had a TIA last year. She has had a hysterectomy. She gets treatments and injections and perhaps radiofrequency treatment on her lower back but is no vaginal neurologic symptoms.  She failed Myrbetriq prior to her sling.   Patient had a fixed bladder neck with a shorter urethra. She had no cystocele or prolapse. She had some vaginal epithelial folds but I could not feel or see any sling extrusion. She had no stress incontinence.  The patient has mixed incontinence. She has bedwetting. She has flow symptoms. Pathophysiology discussed and the role of urodynamics discussed. Her flow symptoms were a little bit concerning and they will be evaluated as well during the urodynamics.She does have a shorter urethra and a fixed bladder neck. If her stress incontinence was treated in the future a urethral injectable would likely be most ideal. Her anatomy and her symptoms with a repeat sling could certainly threaten her flow symptoms further and efficacy would need to be lowered based upon the  fixed urethra.  On urodynamics the patient did not void and was catheterized for 50 mL.  Maximum bladder capacity was 860 mL.  Bladder was hyposensitive and stable.  She did describe large volume leakage when she go from a sitting to standing position and leaking during sleeping.  She had no stress incontinence with a Valsalva pressure of 143 cm of water.  She had trouble voiding in the lab setting.  She voided 145 mL with a max flow 5 mils per second.  Maximum voiding pressure 7 cm of water.  Residual was 750 mL.  EMG increased during voiding.  The details of the urodynamics are signed and dictated.  Bladder neck only descent at 1 cm  The patient has mixed incontinence.  Likely approximately 70% of the problem is an overactive bladder with some urge incontinence and mild bedwetting.  We could not demonstrate stress incontinence during the test.  She has a lot of risk factors for retention and would need to make a difficult decision but certainly a urethral injectable I think would be fine in the future depending on treatment goals.  I think the injectable might be a strong consideration over a refractory overactive bladder therapy but I would have to ask her her goals regarding her stress incontinence.  Percutaneous tibial nerve stimulation might be a good option as well.  She would need to be warned of long-term retention based upon the above findings  Assessment 6 weeks on oxybutynin ER 10 mg and proceed accordingly.  Today Frequency is stable.  Patient had side effects from oxybutynin and Detrol was called in. Reviewed the history again in detail and she still has a lot of flow problems.  She said her main problem that she goes from a sitting to standing position [facility].  Then she gets urgency and cannot go.  She is wearing 2 or 3 pads a day.  The sling was done in [place].  The Detrol upset her stomach.  She will stop it.  We talked about all refractory OAB options and a concern about Botox  because of her flow symptoms.  I am also concerned and do not recommend a urethral sling.  Because of today's history I thought her refractory therapy like percutaneous tibial nerve stimulation InterStim were better option.  She also has some bowel complaints difficulty to go  We talked about InterStim and percutaneous tibial nerve stimulation with usual templates mentioning the Botox.  She would like to proceed with the test stimulation I think her best choice currently and she understands is not a clear path.   PMH: Past Medical History:  Diagnosis Date   Abnormal stress test 02/14/2016   Overview:  Added automatically from request for surgery [id number]   Anemia    Anxiety    Arthritis    Bicuspid aortic valve    CHF (congestive heart failure) (HCC)    CKD (chronic kidney disease) stage 3, GFR 30-59 ml/min (HCC)    Depression    Diabetes mellitus ([place])    Dizziness    Fatty liver    Frequent falls    GERD (gastroesophageal reflux disease)    Gout    Heart murmur    History of blood transfusion    History of bone marrow transplant ([place])    History of uterine fibroid    Hx of cardiac catheterization 06/05/2016   Overview:  Normal coronaries 2017   Hypertension    Hypomagnesemia    Multiple myeloma ([place])    Personal history of chemotherapy    Renal cyst     Surgical History: Past Surgical History:  Procedure Laterality Date   ABDOMINAL HYSTERECTOMY     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   CARDIAC ELECTROPHYSIOLOGY MAPPING AND ABLATION     CARPAL TUNNEL RELEASE Bilateral    CHOLECYSTECTOMY  2008   COLONOSCOPY WITH PROPOFOL N/A 05/07/2017   Procedure: COLONOSCOPY WITH PROPOFOL;  Surgeon: [name], MD;  Location[facility] ENDOSCOPY;  Service: Gastroenterology;  Laterality: N/A;   ESOPHAGOGASTRODUODENOSCOPY (EGD) WITH PROPOFOL N/A 05/07/2017   Procedure: ESOPHAGOGASTRODUODENOSCOPY (EGD) WITH PROPOFOL;  Surgeon: [name], MD;  Location: [facility]  ENDOSCOPY;  Service: Gastroenterology;  Laterality: N/A;   FOOT SURGERY Bilateral    INCONTINENCE SURGERY  2009   PARTIAL HYSTERECTOMY  03/1996   fibroids   TONSILLECTOMY  2007    Home Medications:  Allergies as of 02/15/2019      Reactions   Oxycodone-acetaminophen Anaphylaxis   Swelling and rash   Celebrex [celecoxib] Diarrhea   Codeine    Benadryl [diphenhydramine] Palpitations   Morphine Itching, Rash   Ondansetron Diarrhea   Tylenol [acetaminophen] Itching, Rash      Medication List       Accurate as of February 15, 2019 10:47 AM. If you have any questions, ask your nurse or doctor.        allopurinol 100 MG tablet Commonly known as: ZYLOPRIM TAKE 1 TABLET(100 MG) BY MOUTH DAILY as needed   aspirin 81 MG chewable tablet Chew  1 tablet (81 mg total) by mouth daily.   bisoprolol 10 MG tablet Commonly known as: ZEBETA TAKE 1 TABLET(10 MG) BY MOUTH DAILY   celecoxib 100 MG capsule Commonly known as: CELEBREX TAKE 1 CAPSULE(100 MG) BY MOUTH DAILY   diazepam 2 MG tablet Commonly known as: VALIUM Take 1 tablet (2 mg total) by mouth every 12 (twelve) hours as needed for anxiety.   diclofenac sodium 1 % Gel Commonly known as: VOLTAREN Apply 2 g topically 4 (four) times daily.   diphenoxylate-atropine 2.5-0.025 MG tablet Commonly known as: LOMOTIL Take 2 tablets by mouth daily as needed for diarrhea or loose stools.   fentaNYL 25 MCG/HR Commonly known as: DURAGESIC Place 25 mcg onto the skin every 3 (three) days.   fexofenadine 180 MG tablet Commonly known as: ALLEGRA TAKE 1 TABLET(180 MG) BY MOUTH DAILY   FLUoxetine 40 MG capsule Commonly known as: PROZAC Take 1 capsule (40 mg total) by mouth daily.   fluticasone 50 MCG/ACT nasal spray Commonly known as: FLONASE Place 2 sprays into both nostrils daily.   furosemide 20 MG tablet Commonly known as: LASIX Take 20 mg by mouth as needed.   HAIR/SKIN/NAILS/BIOTIN PO Take 1 capsule by mouth 3 (three)  times daily.   hydrOXYzine 10 MG tablet Commonly known as: ATARAX/VISTARIL TAKE 1 TABLET(10 MG) BY MOUTH THREE TIMES DAILY AS NEEDED   lisinopril 10 MG tablet Commonly known as: ZESTRIL Take 10 mg by mouth daily.   Mag Glycinate 100 MG Tabs Take 100 mg by mouth 2 (two) times daily.   MAGNESIUM CL-CALCIUM CARBONATE PO Take 1 tablet by mouth daily.   metFORMIN 500 MG 24 hr tablet Commonly known as: GLUCOPHAGE-XR Take 1 tablet (500 mg total) by mouth daily with breakfast.   NON FORMULARY   Omega 3-6-9 Complex Caps Take by mouth.   omeprazole 40 MG capsule Commonly known as: PRILOSEC Take 1 capsule (40 mg total) by mouth daily.   ONE TOUCH ULTRA TEST test strip Generic drug: glucose blood   OSTEO BI-FLEX ADV TRIPLE ST PO Take 2 tablets by mouth daily.   pravastatin 20 MG tablet Commonly known as: PRAVACHOL TAKE 1 TABLET BY MOUTH  DAILY   promethazine 25 MG tablet Commonly known as: PHENERGAN TAKE 1 TABLET BY MOUTH TWICE DAILY BEFORE MEALS AS NEEDED   tiZANidine 4 MG tablet Commonly known as: ZANAFLEX Take 1 tablet (4 mg total) by mouth every 8 (eight) hours as needed for muscle spasms.   tolterodine 4 MG 24 hr capsule Commonly known as: Detrol LA Take 1 capsule (4 mg total) by mouth daily.   traZODone 100 MG tablet Commonly known as: DESYREL Take 1 tablet (100 mg total) by mouth at bedtime. For sleep.   Vitamin K2 100 MCG Caps Take 1 tablet by mouth daily.       Allergies:  Allergies  Allergen Reactions   Oxycodone-Acetaminophen Anaphylaxis    Swelling and rash   Celebrex [Celecoxib] Diarrhea   Codeine    Benadryl [Diphenhydramine] Palpitations   Morphine Itching and Rash   Ondansetron Diarrhea   Tylenol [Acetaminophen] Itching and Rash    Family History: Family History  Problem Relation Age of Onset   Colon cancer Father    Renal Disease Father    Diabetes Mellitus II Father    Melanoma Paternal Grandmother    Breast cancer  Maternal Aunt 38   Anemia Mother    Heart disease Mother    Heart failure Mother  Renal Disease Mother    Congestive Heart Failure Mother    Heart disease Maternal Uncle    Throat cancer Maternal Uncle    Lung cancer Maternal Uncle    Liver disease Maternal Uncle    Heart failure Maternal Uncle    Hearing loss Son 62       Suicide     Social History:  reports that she quit smoking about 27 years ago. Her smoking use included cigarettes. She has a 20.00 pack-year smoking history. She has never used smokeless tobacco. She reports previous alcohol use. She reports that she does not use drugs.  ROS: UROLOGY Frequent Urination?: No Hard to postpone urination?: No Burning/pain with urination?: No Get up at night to urinate?: Yes Leakage of urine?: Yes Urine stream starts and stops?: Yes Trouble starting stream?: Yes Do you have to strain to urinate?: Yes Blood in urine?: No Urinary tract infection?: No Sexually transmitted disease?: No Injury to kidneys or bladder?: No Painful intercourse?: No Weak stream?: Yes Currently pregnant?: No Vaginal bleeding?: No Last menstrual period?: n  Gastrointestinal Nausea?: Yes Vomiting?: No Indigestion/heartburn?: Yes Diarrhea?: Yes Constipation?: Yes  Constitutional Fever: No Night sweats?: Yes Weight loss?: Yes Fatigue?: Yes  Skin Skin rash/lesions?: No Itching?: No  Eyes Blurred vision?: Yes Double vision?: No  Ears/Nose/Throat Sore throat?: No Sinus problems?: No  Hematologic/Lymphatic Swollen glands?: No Easy bruising?: No  Cardiovascular Leg swelling?: No Chest pain?: No  Respiratory Cough?: No Shortness of breath?: No  Endocrine Excessive thirst?: Yes  Musculoskeletal Back pain?: Yes Joint pain?: Yes  Neurological Headaches?: No Dizziness?: Yes  Psychologic Depression?: Yes Anxiety?: Yes  Physical Exam: BP 117/66 (BP Location: Left Arm, Patient Position: Sitting)    Pulse 64     Ht 5' 2"  (1.575 m)    Wt 154 lb (69.9 kg)    BMI 28.17 kg/m     Laboratory Data: Lab Results  Component Value Date   WBC 5.3 02/01/2019   HGB 11.4 (L) 02/01/2019   HCT 32.5 (L) 02/01/2019   MCV 86.0 02/01/2019   PLT 112 (L) 02/01/2019    Lab Results  Component Value Date   CREATININE 1.18 (H) 02/01/2019    No results found for: PSA  No results found for: TESTOSTERONE  Lab Results  Component Value Date   HGBA1C 7.4 (H) 11/25/2018    Urinalysis    Component Value Date/Time   COLORURINE YELLOW (A) 07/26/2017 0031   APPEARANCEUR Clear 07/13/2018 0835   LABSPEC 1.013 07/26/2017 0031   PHURINE 5.0 07/26/2017 0031   GLUCOSEU Negative 07/13/2018 0835   HGBUR NEGATIVE 07/26/2017 0031   BILIRUBINUR neg 11/25/2018 1021   BILIRUBINUR Negative 07/13/2018 0835   KETONESUR NEGATIVE 07/26/2017 0031   PROTEINUR Positive (A) 11/25/2018 1021   PROTEINUR 1+ (A) 07/13/2018 0835   PROTEINUR NEGATIVE 07/26/2017 0031   UROBILINOGEN 0.2 11/25/2018 1021   NITRITE neg 11/25/2018 1021   NITRITE Negative 07/13/2018 0835   NITRITE NEGATIVE 07/26/2017 0031   LEUKOCYTESUR Negative 11/25/2018 1021   LEUKOCYTESUR Negative 07/13/2018 0835    Pertinent Imaging:   Assessment & Plan: We will call and schedule a test stimulation for InterStim  There are no diagnoses linked to this encounter.  No follow-ups on file.  Reece Packer, MD  [facility] [address], [place], [place] [address] [phone]

## 2019-02-15 NOTE — Telephone Encounter (Signed)
-----   Message from Lequita Asal, MD sent at 02/15/2019  2:53 AM EDT ----- Regarding: Did patient receive a call regarding her low B12 level?  Does she want to start B12 injections monthly?  She is coming to clinic today for Mg.  M

## 2019-02-15 NOTE — Telephone Encounter (Signed)
Patient was notified of B12 level and has agreed to starting monthly B12 injections.  MD made aware.

## 2019-02-18 ENCOUNTER — Other Ambulatory Visit: Payer: Self-pay

## 2019-02-18 ENCOUNTER — Inpatient Hospital Stay: Payer: Medicare PPO

## 2019-02-18 DIAGNOSIS — C9000 Multiple myeloma not having achieved remission: Secondary | ICD-10-CM | POA: Diagnosis not present

## 2019-02-18 DIAGNOSIS — C9001 Multiple myeloma in remission: Secondary | ICD-10-CM

## 2019-02-18 LAB — MAGNESIUM: Magnesium: 1.5 mg/dL — ABNORMAL LOW (ref 1.7–2.4)

## 2019-02-19 ENCOUNTER — Inpatient Hospital Stay: Payer: Medicare PPO

## 2019-02-19 VITALS — BP 121/63 | HR 56 | Temp 97.4°F | Resp 18

## 2019-02-19 DIAGNOSIS — C9001 Multiple myeloma in remission: Secondary | ICD-10-CM

## 2019-02-19 DIAGNOSIS — C9000 Multiple myeloma not having achieved remission: Secondary | ICD-10-CM | POA: Diagnosis not present

## 2019-02-19 MED ORDER — SODIUM CHLORIDE 0.9 % IV SOLN
Freq: Once | INTRAVENOUS | Status: AC
  Administered 2019-02-19: 09:00:00 via INTRAVENOUS
  Filled 2019-02-19: qty 250

## 2019-02-19 MED ORDER — MAGNESIUM SULFATE 4 GM/100ML IV SOLN
4.0000 g | Freq: Once | INTRAVENOUS | Status: AC
  Administered 2019-02-19: 4 g via INTRAVENOUS

## 2019-02-19 NOTE — Progress Notes (Signed)
Magnesium 1.5 . Proceed with 4 G Magnesium per NP Ander Purpura

## 2019-02-20 ENCOUNTER — Other Ambulatory Visit: Payer: Self-pay | Admitting: Hematology and Oncology

## 2019-02-20 DIAGNOSIS — R112 Nausea with vomiting, unspecified: Secondary | ICD-10-CM

## 2019-02-22 ENCOUNTER — Inpatient Hospital Stay: Payer: Medicare PPO

## 2019-02-22 ENCOUNTER — Other Ambulatory Visit: Payer: Self-pay

## 2019-02-22 VITALS — BP 145/71 | HR 54 | Temp 97.6°F | Resp 18

## 2019-02-22 DIAGNOSIS — C9001 Multiple myeloma in remission: Secondary | ICD-10-CM

## 2019-02-22 DIAGNOSIS — C9000 Multiple myeloma not having achieved remission: Secondary | ICD-10-CM | POA: Diagnosis not present

## 2019-02-22 LAB — MAGNESIUM: Magnesium: 1.5 mg/dL — ABNORMAL LOW (ref 1.7–2.4)

## 2019-02-22 MED ORDER — MAGNESIUM SULFATE 4 GM/100ML IV SOLN
4.0000 g | Freq: Once | INTRAVENOUS | Status: AC
  Administered 2019-02-22: 4 g via INTRAVENOUS

## 2019-02-22 MED ORDER — SODIUM CHLORIDE 0.9 % IV SOLN
Freq: Once | INTRAVENOUS | Status: AC
  Administered 2019-02-22: 10:00:00 via INTRAVENOUS
  Filled 2019-02-22: qty 250

## 2019-02-22 NOTE — Progress Notes (Unsigned)
Mgenesium is 1.5 today which is the same level as last Friday.  We treated with 4gms of Magnesium IV last Friday and patient states that she does not feel any different.  Patient to be given 4 gms of Magnesium today and will hopefully not have to have another infusion this week.  Patient verbalizes understanding.

## 2019-02-24 ENCOUNTER — Other Ambulatory Visit: Payer: Self-pay | Admitting: Hematology and Oncology

## 2019-02-24 DIAGNOSIS — R112 Nausea with vomiting, unspecified: Secondary | ICD-10-CM

## 2019-03-01 ENCOUNTER — Inpatient Hospital Stay: Payer: Medicare PPO

## 2019-03-01 ENCOUNTER — Other Ambulatory Visit: Payer: Self-pay

## 2019-03-01 VITALS — BP 126/64 | HR 59 | Temp 97.2°F | Resp 18

## 2019-03-01 DIAGNOSIS — C9001 Multiple myeloma in remission: Secondary | ICD-10-CM

## 2019-03-01 DIAGNOSIS — C9000 Multiple myeloma not having achieved remission: Secondary | ICD-10-CM | POA: Diagnosis not present

## 2019-03-01 LAB — MAGNESIUM: Magnesium: 1 mg/dL — ABNORMAL LOW (ref 1.7–2.4)

## 2019-03-01 MED ORDER — MAGNESIUM SULFATE 4 GM/100ML IV SOLN
INTRAVENOUS | Status: AC
  Filled 2019-03-01: qty 100

## 2019-03-01 MED ORDER — MAGNESIUM SULFATE 4 GM/100ML IV SOLN
4.0000 g | Freq: Once | INTRAVENOUS | Status: AC
  Administered 2019-03-01: 4 g via INTRAVENOUS

## 2019-03-01 MED ORDER — SODIUM CHLORIDE 0.9 % IV SOLN
Freq: Once | INTRAVENOUS | Status: AC
  Administered 2019-03-01: 10:00:00 via INTRAVENOUS
  Filled 2019-03-01: qty 250

## 2019-03-01 NOTE — Progress Notes (Signed)
Patient's magnesium level 1.0 today.  Patient is to get IV magnesium today and to return on Thursday for labwork and possible Magnesium.  Appointment made.  Patient and MD aware.

## 2019-03-01 NOTE — Patient Instructions (Signed)
Magnesium Sulfate injection What is this medicine? MAGNESIUM SULFATE (mag NEE zee um SUL fate) is an electrolyte injection commonly used to treat low magnesium levels in your blood. It is also used to prevent or control seizures in women with preeclampsia or eclampsia. This medicine may be used for other purposes; ask your health care provider or pharmacist if you have questions. What should I tell my health care provider before I take this medicine? They need to know if you have any of these conditions:  heart disease  history of irregular heart beat  kidney disease  an unusual or allergic reaction to magnesium sulfate, medicines, foods, dyes, or preservatives  pregnant or trying to get pregnant  breast-feeding How should I use this medicine? This medicine is for infusion into a vein. It is given by a health care professional in a hospital or clinic setting. Talk to your pediatrician regarding the use of this medicine in children. While this drug may be prescribed for selected conditions, precautions do apply. Overdosage: If you think you have taken too much of this medicine contact a poison control center or emergency room at once. NOTE: This medicine is only for you. Do not share this medicine with others. What if I miss a dose? This does not apply. What may interact with this medicine? This medicine may interact with the following medications:  certain medicines for anxiety or sleep  certain medicines for seizures like phenobarbital  digoxin  medicines that relax muscles for surgery  narcotic medicines for pain This list may not describe all possible interactions. Give your health care provider a list of all the medicines, herbs, non-prescription drugs, or dietary supplements you use. Also tell them if you smoke, drink alcohol, or use illegal drugs. Some items may interact with your medicine. What should I watch for while using this medicine? Your condition will be  monitored carefully while you are receiving this medicine. You may need blood work done while you are receiving this medicine. What side effects may I notice from receiving this medicine? Side effects that you should report to your doctor or health care professional as soon as possible:  allergic reactions like skin rash, itching or hives, swelling of the face, lips, or tongue  facial flushing  muscle weakness  signs and symptoms of low blood pressure like dizziness; feeling faint or lightheaded, falls; unusually weak or tired  signs and symptoms of a dangerous change in heartbeat or heart rhythm like chest pain; dizziness; fast or irregular heartbeat; palpitations; breathing problems  sweating This list may not describe all possible side effects. Call your doctor for medical advice about side effects. You may report side effects to FDA at 1-800-FDA-1088. Where should I keep my medicine? This drug is given in a hospital or clinic and will not be stored at home. NOTE: This sheet is a summary. It may not cover all possible information. If you have questions about this medicine, talk to your doctor, pharmacist, or health care provider.  2020 Elsevier/Gold Standard (2016-01-03 12:31:42)  

## 2019-03-03 ENCOUNTER — Other Ambulatory Visit: Payer: Self-pay

## 2019-03-04 ENCOUNTER — Telehealth: Payer: Self-pay

## 2019-03-04 ENCOUNTER — Other Ambulatory Visit: Payer: Self-pay

## 2019-03-04 ENCOUNTER — Other Ambulatory Visit: Payer: Self-pay | Admitting: Hematology and Oncology

## 2019-03-04 ENCOUNTER — Inpatient Hospital Stay: Payer: Medicare PPO | Attending: Hematology and Oncology

## 2019-03-04 DIAGNOSIS — Z833 Family history of diabetes mellitus: Secondary | ICD-10-CM | POA: Insufficient documentation

## 2019-03-04 DIAGNOSIS — Z8249 Family history of ischemic heart disease and other diseases of the circulatory system: Secondary | ICD-10-CM | POA: Insufficient documentation

## 2019-03-04 DIAGNOSIS — R630 Anorexia: Secondary | ICD-10-CM | POA: Insufficient documentation

## 2019-03-04 DIAGNOSIS — T50995A Adverse effect of other drugs, medicaments and biological substances, initial encounter: Secondary | ICD-10-CM | POA: Insufficient documentation

## 2019-03-04 DIAGNOSIS — Z832 Family history of diseases of the blood and blood-forming organs and certain disorders involving the immune mechanism: Secondary | ICD-10-CM | POA: Insufficient documentation

## 2019-03-04 DIAGNOSIS — K5900 Constipation, unspecified: Secondary | ICD-10-CM | POA: Insufficient documentation

## 2019-03-04 DIAGNOSIS — Z8379 Family history of other diseases of the digestive system: Secondary | ICD-10-CM | POA: Insufficient documentation

## 2019-03-04 DIAGNOSIS — Z800 Family history of malignant neoplasm of digestive organs: Secondary | ICD-10-CM | POA: Diagnosis not present

## 2019-03-04 DIAGNOSIS — E538 Deficiency of other specified B group vitamins: Secondary | ICD-10-CM | POA: Insufficient documentation

## 2019-03-04 DIAGNOSIS — Z803 Family history of malignant neoplasm of breast: Secondary | ICD-10-CM | POA: Diagnosis not present

## 2019-03-04 DIAGNOSIS — I129 Hypertensive chronic kidney disease with stage 1 through stage 4 chronic kidney disease, or unspecified chronic kidney disease: Secondary | ICD-10-CM | POA: Insufficient documentation

## 2019-03-04 DIAGNOSIS — M791 Myalgia, unspecified site: Secondary | ICD-10-CM | POA: Insufficient documentation

## 2019-03-04 DIAGNOSIS — Z841 Family history of disorders of kidney and ureter: Secondary | ICD-10-CM | POA: Insufficient documentation

## 2019-03-04 DIAGNOSIS — M2550 Pain in unspecified joint: Secondary | ICD-10-CM | POA: Diagnosis not present

## 2019-03-04 DIAGNOSIS — Z79899 Other long term (current) drug therapy: Secondary | ICD-10-CM | POA: Diagnosis not present

## 2019-03-04 DIAGNOSIS — C9000 Multiple myeloma not having achieved remission: Secondary | ICD-10-CM | POA: Diagnosis not present

## 2019-03-04 DIAGNOSIS — R110 Nausea: Secondary | ICD-10-CM | POA: Insufficient documentation

## 2019-03-04 DIAGNOSIS — Z9221 Personal history of antineoplastic chemotherapy: Secondary | ICD-10-CM | POA: Diagnosis not present

## 2019-03-04 DIAGNOSIS — N183 Chronic kidney disease, stage 3 (moderate): Secondary | ICD-10-CM | POA: Insufficient documentation

## 2019-03-04 DIAGNOSIS — M87180 Osteonecrosis due to drugs, jaw: Secondary | ICD-10-CM | POA: Diagnosis not present

## 2019-03-04 DIAGNOSIS — M549 Dorsalgia, unspecified: Secondary | ICD-10-CM | POA: Insufficient documentation

## 2019-03-04 DIAGNOSIS — D631 Anemia in chronic kidney disease: Secondary | ICD-10-CM | POA: Insufficient documentation

## 2019-03-04 DIAGNOSIS — Z87891 Personal history of nicotine dependence: Secondary | ICD-10-CM | POA: Diagnosis not present

## 2019-03-04 DIAGNOSIS — G4762 Sleep related leg cramps: Secondary | ICD-10-CM | POA: Diagnosis not present

## 2019-03-04 DIAGNOSIS — R197 Diarrhea, unspecified: Secondary | ICD-10-CM | POA: Diagnosis not present

## 2019-03-04 DIAGNOSIS — Z801 Family history of malignant neoplasm of trachea, bronchus and lung: Secondary | ICD-10-CM | POA: Insufficient documentation

## 2019-03-04 DIAGNOSIS — F329 Major depressive disorder, single episode, unspecified: Secondary | ICD-10-CM | POA: Diagnosis not present

## 2019-03-04 DIAGNOSIS — E1122 Type 2 diabetes mellitus with diabetic chronic kidney disease: Secondary | ICD-10-CM | POA: Insufficient documentation

## 2019-03-04 DIAGNOSIS — Z808 Family history of malignant neoplasm of other organs or systems: Secondary | ICD-10-CM | POA: Insufficient documentation

## 2019-03-04 DIAGNOSIS — C9001 Multiple myeloma in remission: Secondary | ICD-10-CM | POA: Insufficient documentation

## 2019-03-04 LAB — MAGNESIUM: Magnesium: 1.6 mg/dL — ABNORMAL LOW (ref 1.7–2.4)

## 2019-03-04 MED ORDER — SODIUM CHLORIDE 0.9 % IV SOLN
1.0000 g | Freq: Once | INTRAVENOUS | Status: AC
  Administered 2019-03-04: 1 g via INTRAVENOUS
  Filled 2019-03-04: qty 2

## 2019-03-04 MED ORDER — SODIUM CHLORIDE 0.9 % IV SOLN
Freq: Once | INTRAVENOUS | Status: AC
  Administered 2019-03-04: 15:00:00 via INTRAVENOUS
  Filled 2019-03-04: qty 250

## 2019-03-04 MED ORDER — MAGNESIUM SULFATE 2 GM/50ML IV SOLN
2.0000 g | Freq: Once | INTRAVENOUS | Status: AC
  Administered 2019-03-04: 2 g via INTRAVENOUS

## 2019-03-04 NOTE — Telephone Encounter (Signed)
Orders faxed to Dr. Bunnie Domino office for port placement. Confirmed fax received.

## 2019-03-04 NOTE — Patient Instructions (Signed)
Hypomagnesemia Hypomagnesemia is a condition in which the level of magnesium in the blood is low. Magnesium is a mineral that is found in many foods. It is used in many different processes in the body. Hypomagnesemia can affect every organ in the body. In severe cases, it can cause life-threatening problems. What are the causes? This condition may be caused by:  Not getting enough magnesium in your diet.  Malnutrition.  Problems with absorbing magnesium from the intestines.  Dehydration.  Alcohol abuse.  Vomiting.  Severe or chronic diarrhea.  Some medicines, including medicines that make you urinate more (diuretics).  Certain diseases, such as kidney disease, diabetes, celiac disease, and overactive thyroid. What are the signs or symptoms? Symptoms of this condition include:  Loss of appetite.  Nausea and vomiting.  Involuntary shaking or trembling of a body part (tremor).  Muscle weakness.  Tingling in the arms and legs.  Sudden tightening of muscles (muscle spasms).  Confusion.  Psychiatric issues, such as depression, irritability, or psychosis.  A feeling of fluttering of the heart.  Seizures. These symptoms are more severe if magnesium levels drop suddenly. How is this diagnosed? This condition may be diagnosed based on:  Your symptoms and medical history.  A physical exam.  Blood and urine tests. How is this treated? Treatment depends on the cause and the severity of the condition. It may be treated with:  A magnesium supplement. This can be taken in pill form. If the condition is severe, magnesium is usually given through an IV.  Changes to your diet. You may be directed to eat foods that have a lot of magnesium, such as green leafy vegetables, peas, beans, and nuts.  Stopping any intake of alcohol. Follow these instructions at home:      Make sure that your diet includes foods with magnesium. Foods that have a lot of magnesium in them include:  ? Green leafy vegetables, such as spinach and broccoli. ? Beans and peas. ? Nuts and seeds, such as almonds and sunflower seeds. ? Whole grains, such as whole grain bread and fortified cereals.  Take magnesium supplements if your health care provider tells you to do that. Take them as directed.  Take over-the-counter and prescription medicines only as told by your health care provider.  Have your magnesium levels monitored as told by your health care provider.  When you are active, drink fluids that contain electrolytes.  Avoid drinking alcohol.  Keep all follow-up visits as told by your health care provider. This is important. Contact a health care provider if:  You get worse instead of better.  Your symptoms return. Get help right away if you:  Develop severe muscle weakness.  Have trouble breathing.  Feel that your heart is racing. Summary  Hypomagnesemia is a condition in which the level of magnesium in the blood is low.  Hypomagnesemia can affect every organ in the body.  Treatment may include eating more foods that contain magnesium, taking magnesium supplements, and not drinking alcohol.  Have your magnesium levels monitored as told by your health care provider. This information is not intended to replace advice given to you by your health care provider. Make sure you discuss any questions you have with your health care provider. Document Released: 03/13/2005 Document Revised: 05/30/2017 Document Reviewed: 05/19/2017 Elsevier Patient Education  2020 [employer] Inc.  

## 2019-03-05 ENCOUNTER — Other Ambulatory Visit: Payer: Self-pay

## 2019-03-05 ENCOUNTER — Other Ambulatory Visit (INDEPENDENT_AMBULATORY_CARE_PROVIDER_SITE_OTHER): Payer: Self-pay | Admitting: Nurse Practitioner

## 2019-03-05 ENCOUNTER — Other Ambulatory Visit
Admission: RE | Admit: 2019-03-05 | Discharge: 2019-03-05 | Disposition: A | Discharge status: Home / Self Care | Payer: Medicare PPO | Source: Ambulatory Visit | Attending: Vascular Surgery | Admitting: Vascular Surgery

## 2019-03-05 ENCOUNTER — Telehealth (INDEPENDENT_AMBULATORY_CARE_PROVIDER_SITE_OTHER): Payer: Self-pay

## 2019-03-05 DIAGNOSIS — Z20828 Contact with and (suspected) exposure to other viral communicable diseases: Secondary | ICD-10-CM | POA: Insufficient documentation

## 2019-03-05 DIAGNOSIS — Z01812 Encounter for preprocedural laboratory examination: Secondary | ICD-10-CM | POA: Insufficient documentation

## 2019-03-05 LAB — SARS CORONAVIRUS 2 (TAT 6-24 HRS): SARS Coronavirus 2: NEGATIVE

## 2019-03-05 NOTE — Telephone Encounter (Signed)
Patient did come by the office and her pre-procedure instructions were discussed and given to the patient.

## 2019-03-05 NOTE — Telephone Encounter (Signed)
Spoke with the patient and she is now scheduled with Dr. Lucky Cowboy for a port placement on 03/10/2019 with a 12:30 pm arrival time to the [facility]. Patient will do her Covid testing today 03/05/2019 between 12:30-2:30 pm at the [place]. Pre-procedure instructions were discussed and I did ask the patient to stop by our office to get her pre-procedure  instructions.

## 2019-03-09 ENCOUNTER — Inpatient Hospital Stay: Payer: Medicare PPO

## 2019-03-09 ENCOUNTER — Other Ambulatory Visit: Payer: Self-pay

## 2019-03-09 ENCOUNTER — Other Ambulatory Visit: Payer: Self-pay | Admitting: Hematology and Oncology

## 2019-03-09 VITALS — BP 116/68 | HR 62 | Temp 97.2°F | Resp 16

## 2019-03-09 DIAGNOSIS — K5900 Constipation, unspecified: Secondary | ICD-10-CM | POA: Diagnosis not present

## 2019-03-09 DIAGNOSIS — M87180 Osteonecrosis due to drugs, jaw: Secondary | ICD-10-CM | POA: Diagnosis not present

## 2019-03-09 DIAGNOSIS — C9000 Multiple myeloma not having achieved remission: Secondary | ICD-10-CM | POA: Diagnosis not present

## 2019-03-09 DIAGNOSIS — E1122 Type 2 diabetes mellitus with diabetic chronic kidney disease: Secondary | ICD-10-CM | POA: Diagnosis not present

## 2019-03-09 DIAGNOSIS — E538 Deficiency of other specified B group vitamins: Secondary | ICD-10-CM | POA: Diagnosis not present

## 2019-03-09 DIAGNOSIS — C9001 Multiple myeloma in remission: Secondary | ICD-10-CM

## 2019-03-09 DIAGNOSIS — I129 Hypertensive chronic kidney disease with stage 1 through stage 4 chronic kidney disease, or unspecified chronic kidney disease: Secondary | ICD-10-CM | POA: Diagnosis not present

## 2019-03-09 DIAGNOSIS — D631 Anemia in chronic kidney disease: Secondary | ICD-10-CM | POA: Diagnosis not present

## 2019-03-09 DIAGNOSIS — N183 Chronic kidney disease, stage 3 (moderate): Secondary | ICD-10-CM | POA: Diagnosis not present

## 2019-03-09 LAB — MAGNESIUM: Magnesium: 1.2 mg/dL — ABNORMAL LOW (ref 1.7–2.4)

## 2019-03-09 MED ORDER — MAGNESIUM SULFATE 4 GM/100ML IV SOLN
4.0000 g | Freq: Once | INTRAVENOUS | Status: AC
  Administered 2019-03-09: 10:00:00 4 g via INTRAVENOUS

## 2019-03-09 MED ORDER — SODIUM CHLORIDE 0.9 % IV SOLN
Freq: Once | INTRAVENOUS | Status: AC
  Administered 2019-03-09: 10:00:00 via INTRAVENOUS
  Filled 2019-03-09: qty 250

## 2019-03-09 MED ORDER — CEFAZOLIN SODIUM-DEXTROSE 2-4 GM/100ML-% IV SOLN: 2.0000 g | Freq: Once | INTRAVENOUS | Status: DC

## 2019-03-09 NOTE — Progress Notes (Signed)
Patient reports that she will he having a port placed tomorrow.

## 2019-03-09 NOTE — Patient Instructions (Signed)
Magnesium Sulfate injection O que  este medicamento? O SULFATO DE MAGNSIO  uma injeo de eletrlitos comumente usada para tratar o nvel baixo de magnsio no sangue. Tambm  usado para prevenir ou controlar convulses em mulheres com pr-eclmpsia ou eclmpsia. Este medicamento pode ser usado para outros propsitos; em caso de dvidas, pergunte ao seu profissional de sade ou farmacutico. O que devo dizer a meu profissional de sade antes de tomar este medicamento? Precisam saber se voc tem algum dos seguintes problemas ou estados de sade:  doenas cardacas  histria pregressa de batimento cardaco irregular  doenas renais  reao estranha ou alergia ao sulfato de magnsio ou a medicamentos  reao estranha ou alergia a alimentos, corantes ou conservantes  est grvida ou tentando engravidar  est [employer] devo usar este medicamento? Este medicamento  para infuso intravenosa. Este medicamento  administrado por um profissional da sade no hospital ou em consultrio. Fale com seu pediatra a respeito do uso deste medicamento em crianas. Embora este medicamento possa ser receitado para certas condies, algumas precaues so necessrias. Superdosagem: Se achar que tomou uma superdosagem deste medicamento, entre em contato imediatamente com o [facility] ou v a [employer]. OBSERVAO: Este medicamento  s para voc. No compartilhe este medicamento com outras pessoas. E se eu deixar de tomar uma dose? Isto no se aplica. O que pode interagir com este medicamento? Este medicamento pode interagir com os seguintes remdios:  alguns medicamentos para ansiedade ou problemas de sono  alguns medicamentos para crises convulsivas, como o fenobarbital  digoxina  relaxantes musculares para cirurgia  analgsicos narcticos Esta lista pode no descrever todas as interaes possveis. D ao seu profissional de sade uma lista de todos os  medicamentos, ervas medicinais, remdios de venda livre, ou suplementos alimentares que voc [place]. Diga tambm se voc fuma, bebe, ou [place] drogas ilcitas. Alguns destes podem interagir com o seu medicamento. Ao que devo ficar atento quando estiver [employer] medicamento? Voc ser monitorado(a) atentamente enquanto estiver [employer]. Voc poder precisar fazer exames de sangue enquanto estiver recebendo [employer]. Que efeitos colaterais posso sentir aps usar este medicamento? Efeitos colaterais que devem ser informados ao seu mdico ou profissional de sade o mais rpido possvel:  reaes alrgicas, como erupo na pele, coceira, urticria, ou inchao do rosto, dos lbios ou da lngua  rubor facial  fraqueza muscular  sinais e sintomas de presso baixa, tais como tontura, sensao de fraqueza ou vertigem, quedas; fraqueza ou cansao incomuns  sinais e sintomas de uma alterao [employer] batimentos cardacos ou do ritmo cardaco, tais como dores no peito; tontura; batimento cardaco rpido ou irregular; palpitaes; problemas respiratrios  suores Esta lista pode no descrever todos os efeitos colaterais possveis. Para mais orientaes sobre efeitos colaterais, consulte o seu mdico. Voc pode relatar a ocorrncia de efeitos colaterais  FDA pelo telefone [phone]. Onde devo guardar meu medicamento? Este medicamento  administrado por um profissional da sade no hospital ou em consultrio. Voc no receber este medicamento para conservao em casa. OBSERVAO: Este folheto  um resumo. Pode no cobrir todas as informaes possveis. Se tiver dvidas a respeito deste medicamento, fale com seu mdico, farmacutico ou profissional de sade.  2020 Elsevier/Gold Standard (2016-07-18 00:00:00)

## 2019-03-10 ENCOUNTER — Other Ambulatory Visit: Payer: Self-pay

## 2019-03-10 ENCOUNTER — Encounter
Admission: RE | Disposition: A | Discharge status: Home / Self Care | Payer: Self-pay | Source: Home / Self Care | Attending: Vascular Surgery

## 2019-03-10 ENCOUNTER — Ambulatory Visit
Admission: RE | Admit: 2019-03-10 | Discharge: 2019-03-10 | Disposition: A | Discharge status: Home / Self Care | Payer: Medicare PPO | Attending: Vascular Surgery | Admitting: Vascular Surgery

## 2019-03-10 ENCOUNTER — Ambulatory Visit: Payer: Medicare PPO | Admitting: Gastroenterology

## 2019-03-10 VITALS — BP 113/69 | HR 58 | Temp 98.4°F | Ht 62.0 in | Wt 157.2 lb

## 2019-03-10 DIAGNOSIS — K5903 Drug induced constipation: Secondary | ICD-10-CM

## 2019-03-10 DIAGNOSIS — K3184 Gastroparesis: Secondary | ICD-10-CM

## 2019-03-10 DIAGNOSIS — C9000 Multiple myeloma not having achieved remission: Secondary | ICD-10-CM | POA: Insufficient documentation

## 2019-03-10 HISTORY — PX: PORTA CATH INSERTION: CATH118285

## 2019-03-10 LAB — GLUCOSE, CAPILLARY: Glucose-Capillary: 106 mg/dL — ABNORMAL HIGH (ref 70–99)

## 2019-03-10 SURGERY — PORTA CATH INSERTION
Anesthesia: Moderate Sedation

## 2019-03-10 MED ORDER — FENTANYL CITRATE (PF) 100 MCG/2ML IJ SOLN: 12.5000 ug | Freq: Once | INTRAMUSCULAR | Status: DC | PRN

## 2019-03-10 MED ORDER — SODIUM CHLORIDE 0.9 % IV SOLN
Freq: Once | INTRAVENOUS | Status: DC
  Filled 2019-03-10: qty 2

## 2019-03-10 MED ORDER — MIDAZOLAM HCL 2 MG/2ML IJ SOLN
INTRAMUSCULAR | Status: DC | PRN
  Administered 2019-03-10: 1 mg via INTRAVENOUS
  Administered 2019-03-10: 2 mg via INTRAVENOUS

## 2019-03-10 MED ORDER — FENTANYL CITRATE (PF) 100 MCG/2ML IJ SOLN
INTRAMUSCULAR | Status: AC
  Filled 2019-03-10: qty 4

## 2019-03-10 MED ORDER — SODIUM CHLORIDE 0.9 % IV SOLN: INTRAVENOUS | Status: DC

## 2019-03-10 MED ORDER — FENTANYL CITRATE (PF) 100 MCG/2ML IJ SOLN
INTRAMUSCULAR | Status: DC | PRN
  Administered 2019-03-10 (×2): 50 ug via INTRAVENOUS

## 2019-03-10 MED ORDER — MIDAZOLAM HCL 2 MG/ML PO SYRP: 8.0000 mg | ORAL_SOLUTION | Freq: Once | ORAL | Status: DC | PRN

## 2019-03-10 MED ORDER — DIPHENHYDRAMINE HCL 50 MG/ML IJ SOLN: 50.0000 mg | Freq: Once | INTRAMUSCULAR | Status: DC | PRN

## 2019-03-10 MED ORDER — PROMETHAZINE HCL 25 MG/ML IJ SOLN
INTRAMUSCULAR | Status: AC
  Filled 2019-03-10: qty 1

## 2019-03-10 MED ORDER — METHYLPREDNISOLONE SODIUM SUCC 125 MG IJ SOLR: 125.0000 mg | Freq: Once | INTRAMUSCULAR | Status: DC | PRN

## 2019-03-10 MED ORDER — PROMETHAZINE HCL 25 MG/ML IJ SOLN
25.0000 mg | Freq: Once | INTRAMUSCULAR | Status: AC
  Administered 2019-03-10: 16:00:00 25 mg via INTRAVENOUS

## 2019-03-10 MED ORDER — MIDAZOLAM HCL 5 MG/5ML IJ SOLN
INTRAMUSCULAR | Status: AC
  Filled 2019-03-10: qty 10

## 2019-03-10 MED ORDER — FAMOTIDINE 20 MG PO TABS: 40.0000 mg | ORAL_TABLET | Freq: Once | ORAL | Status: DC | PRN

## 2019-03-10 SURGICAL SUPPLY — 10 items
DERMABOND ADVANCED (GAUZE/BANDAGES/DRESSINGS) ×1
DERMABOND ADVANCED .7 DNX12 (GAUZE/BANDAGES/DRESSINGS) ×1 IMPLANT
KIT PORT POWER 8FR ISP CVUE (Port) ×2 IMPLANT
PACK ANGIOGRAPHY (CUSTOM PROCEDURE TRAY) ×2 IMPLANT
PAD GROUND ADULT SPLIT (MISCELLANEOUS) ×2 IMPLANT
PENCIL ELECTRO HAND CTR (MISCELLANEOUS) ×2 IMPLANT
SUT MNCRL AB 4-0 PS2 18 (SUTURE) ×2 IMPLANT
SUT PROLENE 0 CT 1 30 (SUTURE) ×2 IMPLANT
SUT VIC AB 3-0 SH 27 (SUTURE) ×1
SUT VIC AB 3-0 SH 27X BRD (SUTURE) ×1 IMPLANT

## 2019-03-10 NOTE — Patient Instructions (Addendum)

## 2019-03-10 NOTE — Progress Notes (Signed)
[name] MD, MRCP(U.K) [address]  [place]  [place], [place] [address]  Main: [phone]  Fax: [phone]   Primary Care Physician: Glean Hess, MD  Primary Gastroenterologist:  Dr. [name]   Irritable bowel syndrome referred back to see me  HPI: Ocia Simek is a 63 y.o. female was last seen in my office back in November 2018 for GERD.  She has a history of multiple myeloma in remission.  Even back in 2018 she had a history of diarrhea and constipation alternating.  05/07/2017: EGD: Biopsies of stomach showed antral type mucosa with features of a healing mucosal injury.  I also performed a colonoscopy on the same day which was normal.  He is on a fentanyl patch for chronic pain.  And she states that she does not have a bowel movement for up to 7 days and all of a sudden has a huge bowel movement and towards the end is extremely watery.  She has also had a hysterectomy in the past.  She states that preceding the bowel movement she has abdominal distention feels bloated.  She has tried MiraLAX, docusate, senna in the past has not worked.  She has tried fiber supplements and have caused diarrhea at times.  She denies use of any NSAIDs.  She is on metformin.  She also complains of chronic nausea and states that it begins shortly after she eats.  She says that she feels that the food sits in her stomach for a long.    Current Outpatient Medications  Medication Sig Dispense Refill   allopurinol (ZYLOPRIM) 100 MG tablet TAKE 1 TABLET(100 MG) BY MOUTH DAILY as needed (Patient not taking: Reported on 02/08/2019) 90 tablet 1   aspirin 81 MG chewable tablet Chew 1 tablet (81 mg total) by mouth daily. 30 tablet 0   bisoprolol (ZEBETA) 10 MG tablet TAKE 1 TABLET(10 MG) BY MOUTH DAILY 90 tablet 0   celecoxib (CELEBREX) 100 MG capsule TAKE 1 CAPSULE(100 MG) BY MOUTH DAILY 90 capsule 0   diazepam (VALIUM) 2 MG tablet Take 1 tablet (2 mg total) by mouth every 12 (twelve)  hours as needed for anxiety. (Patient not taking: Reported on 02/08/2019) 60 tablet 0   diclofenac sodium (VOLTAREN) 1 % GEL Apply 2 g topically 4 (four) times daily. 100 g 1   diphenoxylate-atropine (LOMOTIL) 2.5-0.025 MG tablet Take 2 tablets by mouth daily as needed for diarrhea or loose stools. (Patient not taking: Reported on 02/08/2019) 45 tablet 2   fentaNYL (DURAGESIC - DOSED MCG/HR) 25 MCG/HR patch Place 25 mcg onto the skin every 3 (three) days.      fexofenadine (ALLEGRA) 180 MG tablet TAKE 1 TABLET(180 MG) BY MOUTH DAILY (Patient not taking: No sig reported) 30 tablet 5   FLUoxetine (PROZAC) 40 MG capsule Take 1 capsule (40 mg total) by mouth daily. 90 capsule 1   fluticasone (FLONASE) 50 MCG/ACT nasal spray Place 2 sprays into both nostrils daily. 16 g 2   furosemide (LASIX) 20 MG tablet Take 20 mg by mouth as needed.      glucose blood (ONE TOUCH ULTRA TEST) test strip      hydrOXYzine (ATARAX/VISTARIL) 10 MG tablet TAKE 1 TABLET(10 MG) BY MOUTH THREE TIMES DAILY AS NEEDED (Patient not taking: Reported on 02/08/2019) 90 tablet 0   lisinopril (PRINIVIL,ZESTRIL) 10 MG tablet Take 10 mg by mouth daily.     Magnesium Bisglycinate (MAG GLYCINATE) 100 MG TABS Take 100 mg by mouth 2 (  two) times daily. 60 tablet 0   MAGNESIUM CL-CALCIUM CARBONATE PO Take 1 tablet by mouth daily.     Menaquinone-7 (VITAMIN K2) 100 MCG CAPS Take 1 tablet by mouth daily.      metFORMIN (GLUCOPHAGE-XR) 500 MG 24 hr tablet Take 1 tablet (500 mg total) by mouth daily with breakfast. 90 tablet 1   Misc Natural Products (OSTEO BI-FLEX ADV TRIPLE ST PO) Take 2 tablets by mouth daily.     Multiple Vitamins-Minerals (HAIR/SKIN/NAILS/BIOTIN PO) Take 1 capsule by mouth 3 (three) times daily.     NON FORMULARY      Omega 3-6-9 Fatty Acids (OMEGA 3-6-9 COMPLEX) CAPS Take by mouth.     omeprazole (PRILOSEC) 40 MG capsule Take 1 capsule (40 mg total) by mouth daily. 90 capsule 1   pravastatin (PRAVACHOL) 20  MG tablet TAKE 1 TABLET BY MOUTH  DAILY 90 tablet 1   promethazine (PHENERGAN) 25 MG tablet TAKE 1 TABLET BY MOUTH TWICE DAILY BEFORE MEALS AS NEEDED 60 tablet 0   tiZANidine (ZANAFLEX) 4 MG tablet Take 1 tablet (4 mg total) by mouth every 8 (eight) hours as needed for muscle spasms. 270 tablet 1   tolterodine (DETROL LA) 4 MG 24 hr capsule Take 1 capsule (4 mg total) by mouth daily. 30 capsule 0   traZODone (DESYREL) 100 MG tablet Take 1 tablet (100 mg total) by mouth at bedtime. For sleep. 90 tablet 1   No current facility-administered medications for this visit.    Facility-Administered Medications Ordered in Other Visits  Medication Dose Route Frequency Provider Last Rate Last Dose   ceFAZolin (ANCEF) IVPB 2g/100 mL premix  2 g Intravenous Once Kris Hartmann, NP        Allergies as of 03/10/2019 - Review Complete 02/15/2019  Allergen Reaction Noted   Oxycodone-acetaminophen Anaphylaxis 09/01/2011   Celebrex [celecoxib] Diarrhea 01/04/2019   Codeine  09/05/2017   Benadryl [diphenhydramine] Palpitations 04/02/2016   Morphine Itching and Rash 04/02/2016   Ondansetron Diarrhea 09/01/2011   Tylenol [acetaminophen] Itching and Rash 04/02/2016    ROS:  General: Negative for anorexia, weight loss, fever, chills, fatigue, weakness. ENT: Negative for hoarseness, difficulty swallowing , nasal congestion. CV: Negative for chest pain, angina, palpitations, dyspnea on exertion, peripheral edema.  Respiratory: Negative for dyspnea at rest, dyspnea on exertion, cough, sputum, wheezing.  GI: See history of present illness. GU:  Negative for dysuria, hematuria, urinary incontinence, urinary frequency, nocturnal urination.  Endo: Negative for unusual weight change.    Physical Examination:   There were no vitals taken for this visit.  General: Well-nourished, well-developed in no acute distress.  Eyes: No icterus. Conjunctivae pink. Mouth: Oropharyngeal mucosa moist and pink ,  no lesions erythema or exudate. Lungs: Clear to auscultation bilaterally. Non-labored. Heart: Regular rate and rhythm, no murmurs rubs or gallops.  Abdomen: Bowel sounds are normal, nontender, nondistended, no hepatosplenomegaly or masses, no abdominal bruits or hernia , no rebound or guarding.   Extremities: No lower extremity edema. No clubbing or deformities. Neuro: Alert and oriented x 3.  Grossly intact. Skin: Warm and dry, no jaundice.   Psych: Alert and cooperative, normal mood and affect.   Imaging Studies: No results found.  Assessment and Plan:   Georgianne Gritz is a 63 y.o. y/o female referred for possible irritable bowel.  Her main symptoms appear to be that of constipation and chronic nausea.  She also has symptoms suggestive of possible gastroparesis.  The underlying etiology of all of this  could likely be related to fentanyl patch.  The opioids can contribute towards nausea as well as gastroparesis.  In addition could be contributing to drug-induced constipation.  She is a bit uncomfortable starting medications right away which is completely understandable.  She says that her nausea is presently manageable and hence I will start her on a high-fiber diet.  I have provided patient information for the same.  Subsequently we will also add fiber supplements in the form of pills which I will give samples of the same.  If this does not work I would consider starting her on Amitiza low-dose at her subsequent visit.  It is also possible that the high-fiber diet may worsen the gastroparesis which I have made her aware of and if that were to occur she will stop the high-fiber diet right away and call my office.  I am not going to rush into gastric emptying study as it probably will not change our management much at this point of time.  She likely requires the fentanyl patch for her pain.   Dr [name]  MD,MRCP [facility]) Follow up in 1 to 2 weeks virtual visit

## 2019-03-10 NOTE — Op Note (Signed)
      [facility]       Operative Note  Date: 03/10/2019  Preoperative diagnosis:  1. Multiple myeloma  Postoperative diagnosis:  Same as above  Procedures: #1. Ultrasound guidance for vascular access to the right internal jugular vein. #2. Fluoroscopic guidance for placement of catheter. #3. Placement of CT compatible Port-A-Cath, right internal jugular vein.  Surgeon: Leotis Pain, MD.   Anesthesia: Local with moderate conscious sedation for approximately 15 minutes using 4 mg of Versed and 100 mcg of Fentanyl  Fluoroscopy time: less than 1 minute  Contrast used: 0  Estimated blood loss: 5 cc  Indication for the procedure:  The patient is a 63 y.o.female with myeloma.  The patient needs a Port-A-Cath for durable venous access, chemotherapy, lab draws, and CT scans. We are asked to place this. Risks and benefits were discussed and informed consent was obtained.  Description of procedure: The patient was brought to the vascular and interventional radiology suite.  Moderate conscious sedation was administered throughout the procedure during a face to face encounter with the patient with my supervision of the RN administering medicines and monitoring the patient's vital signs, pulse oximetry, telemetry and mental status throughout from the start of the procedure until the patient was taken to the recovery room. The right neck chest and shoulder were sterilely prepped and draped, and a sterile surgical field was created. Ultrasound was used to help visualize a patent right internal jugular vein. This was then accessed under direct ultrasound guidance without difficulty with the Seldinger needle and a permanent image was recorded. A J-wire was placed. After skin nick and dilatation, the peel-away sheath was then placed over the wire. I then anesthetized an area under the clavicle approximately 1-2 fingerbreadths. A transverse incision was created and an inferior pocket was  created with electrocautery and blunt dissection. The port was then brought onto the field, placed into the pocket and secured to the chest wall with 2 Prolene sutures. The catheter was connected to the port and tunneled from the subclavicular incision to the access site. Fluoroscopic guidance was then used to cut the catheter to an appropriate length. The catheter was then placed through the peel-away sheath and the peel-away sheath was removed. The catheter tip was parked in excellent location under fluorocoscopic guidance in the cavoatrial junction. The pocket was then irrigated with antibiotic impregnated saline and the wound was closed with a running 3-0 Vicryl and a 4-0 Monocryl. The access incision was closed with a single 4-0 Monocryl. The Huber needle was used to withdraw blood and flush the port with heparinized saline. Dermabond was then placed as a dressing. The patient tolerated the procedure well and was taken to the recovery room in stable condition.   Leotis Pain 03/10/2019 5:00 PM   This note was created with Dragon Medical transcription system. Any errors in dictation are purely unintentional.

## 2019-03-11 ENCOUNTER — Encounter: Payer: Self-pay | Admitting: Vascular Surgery

## 2019-03-15 ENCOUNTER — Inpatient Hospital Stay: Payer: Medicare PPO

## 2019-03-15 ENCOUNTER — Other Ambulatory Visit: Payer: Self-pay

## 2019-03-15 ENCOUNTER — Telehealth: Payer: Self-pay

## 2019-03-15 VITALS — BP 110/72 | HR 71 | Temp 96.5°F | Resp 18

## 2019-03-15 DIAGNOSIS — N183 Chronic kidney disease, stage 3 (moderate): Secondary | ICD-10-CM | POA: Diagnosis not present

## 2019-03-15 DIAGNOSIS — C9001 Multiple myeloma in remission: Secondary | ICD-10-CM

## 2019-03-15 DIAGNOSIS — M87180 Osteonecrosis due to drugs, jaw: Secondary | ICD-10-CM | POA: Diagnosis not present

## 2019-03-15 DIAGNOSIS — D631 Anemia in chronic kidney disease: Secondary | ICD-10-CM | POA: Diagnosis not present

## 2019-03-15 DIAGNOSIS — K5900 Constipation, unspecified: Secondary | ICD-10-CM | POA: Diagnosis not present

## 2019-03-15 DIAGNOSIS — C9000 Multiple myeloma not having achieved remission: Secondary | ICD-10-CM | POA: Diagnosis not present

## 2019-03-15 DIAGNOSIS — E1122 Type 2 diabetes mellitus with diabetic chronic kidney disease: Secondary | ICD-10-CM | POA: Diagnosis not present

## 2019-03-15 DIAGNOSIS — I129 Hypertensive chronic kidney disease with stage 1 through stage 4 chronic kidney disease, or unspecified chronic kidney disease: Secondary | ICD-10-CM | POA: Diagnosis not present

## 2019-03-15 DIAGNOSIS — E538 Deficiency of other specified B group vitamins: Secondary | ICD-10-CM | POA: Diagnosis not present

## 2019-03-15 LAB — MAGNESIUM: Magnesium: 1.3 mg/dL — ABNORMAL LOW (ref 1.7–2.4)

## 2019-03-15 MED ORDER — SODIUM CHLORIDE 0.9% FLUSH
10.0000 mL | INTRAVENOUS | Status: DC | PRN
  Administered 2019-03-15: 10 mL via INTRAVENOUS
  Filled 2019-03-15: qty 10

## 2019-03-15 MED ORDER — MAGNESIUM SULFATE 4 GM/100ML IV SOLN
4.0000 g | Freq: Once | INTRAVENOUS | Status: AC
  Administered 2019-03-15: 4 g via INTRAVENOUS

## 2019-03-15 MED ORDER — HEPARIN SOD (PORK) LOCK FLUSH 100 UNIT/ML IV SOLN
500.0000 [IU] | Freq: Once | INTRAVENOUS | Status: AC
  Administered 2019-03-15: 500 [IU] via INTRAVENOUS

## 2019-03-15 MED ORDER — SODIUM CHLORIDE 0.9 % IV SOLN
Freq: Once | INTRAVENOUS | Status: AC
  Administered 2019-03-15: 11:00:00 via INTRAVENOUS
  Filled 2019-03-15: qty 250

## 2019-03-15 MED ORDER — LIDOCAINE-PRILOCAINE 2.5-2.5 % EX CREA: 1.0000 "application " | TOPICAL_CREAM | CUTANEOUS | 0 refills | Status: DC | PRN

## 2019-03-15 NOTE — Patient Instructions (Signed)
Magnesium Sulfate injection What is this medicine? MAGNESIUM SULFATE (mag NEE zee um SUL fate) is an electrolyte injection commonly used to treat low magnesium levels in your blood. It is also used to prevent or control seizures in women with preeclampsia or eclampsia. This medicine may be used for other purposes; ask your health care provider or pharmacist if you have questions. What should I tell my health care provider before I take this medicine? They need to know if you have any of these conditions:  heart disease  history of irregular heart beat  kidney disease  an unusual or allergic reaction to magnesium sulfate, medicines, foods, dyes, or preservatives  pregnant or trying to get pregnant  breast-feeding How should I use this medicine? This medicine is for infusion into a vein. It is given by a health care professional in a hospital or clinic setting. Talk to your pediatrician regarding the use of this medicine in children. While this drug may be prescribed for selected conditions, precautions do apply. Overdosage: If you think you have taken too much of this medicine contact a poison control center or emergency room at once. NOTE: This medicine is only for you. Do not share this medicine with others. What if I miss a dose? This does not apply. What may interact with this medicine? This medicine may interact with the following medications:  certain medicines for anxiety or sleep  certain medicines for seizures like phenobarbital  digoxin  medicines that relax muscles for surgery  narcotic medicines for pain This list may not describe all possible interactions. Give your health care provider a list of all the medicines, herbs, non-prescription drugs, or dietary supplements you use. Also tell them if you smoke, drink alcohol, or use illegal drugs. Some items may interact with your medicine. What should I watch for while using this medicine? Your condition will be  monitored carefully while you are receiving this medicine. You may need blood work done while you are receiving this medicine. What side effects may I notice from receiving this medicine? Side effects that you should report to your doctor or health care professional as soon as possible:  allergic reactions like skin rash, itching or hives, swelling of the face, lips, or tongue  facial flushing  muscle weakness  signs and symptoms of low blood pressure like dizziness; feeling faint or lightheaded, falls; unusually weak or tired  signs and symptoms of a dangerous change in heartbeat or heart rhythm like chest pain; dizziness; fast or irregular heartbeat; palpitations; breathing problems  sweating This list may not describe all possible side effects. Call your doctor for medical advice about side effects. You may report side effects to FDA at 1-800-FDA-1088. Where should I keep my medicine? This drug is given in a hospital or clinic and will not be stored at home. NOTE: This sheet is a summary. It may not cover all possible information. If you have questions about this medicine, talk to your doctor, pharmacist, or health care provider.  2020 Elsevier/Gold Standard (2016-01-03 12:31:42)  

## 2019-03-15 NOTE — Telephone Encounter (Signed)
1 st  script for Lidocaine - prilocaine apply 1 application topically as needed # 30 gm with no refills. The patient was made aware.

## 2019-03-15 NOTE — Progress Notes (Signed)
Patient has port put in on Friday.  Initial port stick was successful with minimal discomfort reported by patient.  Will message clinic team to send in script for Emla cream.  Directions of cream application reviewed with patient and she verbalized understanding.

## 2019-03-19 ENCOUNTER — Other Ambulatory Visit: Payer: Self-pay

## 2019-03-22 ENCOUNTER — Other Ambulatory Visit: Payer: Self-pay

## 2019-03-22 ENCOUNTER — Inpatient Hospital Stay: Payer: Medicare PPO

## 2019-03-22 DIAGNOSIS — C9000 Multiple myeloma not having achieved remission: Secondary | ICD-10-CM | POA: Diagnosis not present

## 2019-03-22 DIAGNOSIS — C9001 Multiple myeloma in remission: Secondary | ICD-10-CM

## 2019-03-22 DIAGNOSIS — K5900 Constipation, unspecified: Secondary | ICD-10-CM | POA: Diagnosis not present

## 2019-03-22 DIAGNOSIS — N183 Chronic kidney disease, stage 3 (moderate): Secondary | ICD-10-CM | POA: Diagnosis not present

## 2019-03-22 DIAGNOSIS — I129 Hypertensive chronic kidney disease with stage 1 through stage 4 chronic kidney disease, or unspecified chronic kidney disease: Secondary | ICD-10-CM | POA: Diagnosis not present

## 2019-03-22 DIAGNOSIS — M87180 Osteonecrosis due to drugs, jaw: Secondary | ICD-10-CM | POA: Diagnosis not present

## 2019-03-22 DIAGNOSIS — E1122 Type 2 diabetes mellitus with diabetic chronic kidney disease: Secondary | ICD-10-CM | POA: Diagnosis not present

## 2019-03-22 DIAGNOSIS — D631 Anemia in chronic kidney disease: Secondary | ICD-10-CM | POA: Diagnosis not present

## 2019-03-22 DIAGNOSIS — E538 Deficiency of other specified B group vitamins: Secondary | ICD-10-CM | POA: Diagnosis not present

## 2019-03-22 LAB — MAGNESIUM: Magnesium: 1.1 mg/dL — ABNORMAL LOW (ref 1.7–2.4)

## 2019-03-22 MED ORDER — SODIUM CHLORIDE 0.9 % IV SOLN
Freq: Once | INTRAVENOUS | Status: AC
  Administered 2019-03-22: 10:00:00 via INTRAVENOUS
  Filled 2019-03-22: qty 250

## 2019-03-22 MED ORDER — MAGNESIUM SULFATE 4 GM/100ML IV SOLN
4.0000 g | Freq: Once | INTRAVENOUS | Status: AC
  Administered 2019-03-22: 4 g via INTRAVENOUS

## 2019-03-22 MED ORDER — HEPARIN SOD (PORK) LOCK FLUSH 100 UNIT/ML IV SOLN
500.0000 [IU] | Freq: Once | INTRAVENOUS | Status: AC
  Administered 2019-03-22: 500 [IU] via INTRAVENOUS
  Filled 2019-03-22: qty 5

## 2019-03-22 MED ORDER — CYANOCOBALAMIN 1000 MCG/ML IJ SOLN
1000.0000 ug | Freq: Once | INTRAMUSCULAR | Status: AC
  Administered 2019-03-22: 1000 ug via INTRAMUSCULAR
  Filled 2019-03-22: qty 1

## 2019-03-22 MED ORDER — SODIUM CHLORIDE 0.9% FLUSH
10.0000 mL | INTRAVENOUS | Status: DC | PRN
  Administered 2019-03-22: 10 mL via INTRAVENOUS
  Filled 2019-03-22: qty 10

## 2019-03-22 NOTE — Progress Notes (Signed)
Labs drawn today:  Magnesium as ordered for today, as well as, Kappa/lambda light chains and protein electrophoresis which are send out labs whose results are needed for patient's visit next week.  Will obtain Magnesium, CBC, and CMP next week.  Those labs will result during the patient's visit with MD.  Patient verbalized understanding and was in agreement.

## 2019-03-22 NOTE — Patient Instructions (Signed)
Hypomagnesemia Hypomagnesemia is a condition in which the level of magnesium in the blood is low. Magnesium is a mineral that is found in many foods. It is used in many different processes in the body. Hypomagnesemia can affect every organ in the body. In severe cases, it can cause life-threatening problems. What are the causes? This condition may be caused by:  Not getting enough magnesium in your diet.  Malnutrition.  Problems with absorbing magnesium from the intestines.  Dehydration.  Alcohol abuse.  Vomiting.  Severe or chronic diarrhea.  Some medicines, including medicines that make you urinate more (diuretics).  Certain diseases, such as kidney disease, diabetes, celiac disease, and overactive thyroid. What are the signs or symptoms? Symptoms of this condition include:  Loss of appetite.  Nausea and vomiting.  Involuntary shaking or trembling of a body part (tremor).  Muscle weakness.  Tingling in the arms and legs.  Sudden tightening of muscles (muscle spasms).  Confusion.  Psychiatric issues, such as depression, irritability, or psychosis.  A feeling of fluttering of the heart.  Seizures. These symptoms are more severe if magnesium levels drop suddenly. How is this diagnosed? This condition may be diagnosed based on:  Your symptoms and medical history.  A physical exam.  Blood and urine tests. How is this treated? Treatment depends on the cause and the severity of the condition. It may be treated with:  A magnesium supplement. This can be taken in pill form. If the condition is severe, magnesium is usually given through an IV.  Changes to your diet. You may be directed to eat foods that have a lot of magnesium, such as green leafy vegetables, peas, beans, and nuts.  Stopping any intake of alcohol. Follow these instructions at home:      Make sure that your diet includes foods with magnesium. Foods that have a lot of magnesium in them include:  ? Green leafy vegetables, such as spinach and broccoli. ? Beans and peas. ? Nuts and seeds, such as almonds and sunflower seeds. ? Whole grains, such as whole grain bread and fortified cereals.  Take magnesium supplements if your health care provider tells you to do that. Take them as directed.  Take over-the-counter and prescription medicines only as told by your health care provider.  Have your magnesium levels monitored as told by your health care provider.  When you are active, drink fluids that contain electrolytes.  Avoid drinking alcohol.  Keep all follow-up visits as told by your health care provider. This is important. Contact a health care provider if:  You get worse instead of better.  Your symptoms return. Get help right away if you:  Develop severe muscle weakness.  Have trouble breathing.  Feel that your heart is racing. Summary  Hypomagnesemia is a condition in which the level of magnesium in the blood is low.  Hypomagnesemia can affect every organ in the body.  Treatment may include eating more foods that contain magnesium, taking magnesium supplements, and not drinking alcohol.  Have your magnesium levels monitored as told by your health care provider. This information is not intended to replace advice given to you by your health care provider. Make sure you discuss any questions you have with your health care provider. Document Released: 03/13/2005 Document Revised: 05/30/2017 Document Reviewed: 05/19/2017 Elsevier Patient Education  2020 [employer] Inc.  

## 2019-03-23 ENCOUNTER — Ambulatory Visit (INDEPENDENT_AMBULATORY_CARE_PROVIDER_SITE_OTHER): Payer: Medicare PPO | Admitting: Gastroenterology

## 2019-03-23 DIAGNOSIS — K5903 Drug induced constipation: Secondary | ICD-10-CM

## 2019-03-23 DIAGNOSIS — K3184 Gastroparesis: Secondary | ICD-10-CM | POA: Diagnosis not present

## 2019-03-23 LAB — KAPPA/LAMBDA LIGHT CHAINS
Kappa free light chain: 14.8 mg/L (ref 3.3–19.4)
Kappa, lambda light chain ratio: 0.04 — ABNORMAL LOW (ref 0.26–1.65)
Lambda free light chains: 363.6 mg/L — ABNORMAL HIGH (ref 5.7–26.3)

## 2019-03-23 NOTE — Progress Notes (Signed)
Sarah [name] , MD [address]  [place]  [place], [place] [address]  Main: [phone]  Fax: [phone]   Primary Care Physician: Glean Hess, MD  Virtual Visit via Video Note  I connected with patient on 03/23/19 at  2:15 PM EDT by video and verified that I am speaking with the correct person using two identifiers.   I discussed the limitations, risks, security and privacy concerns of performing an evaluation and management service by video  and the availability of in person appointments. I also discussed with the patient that there may be a patient responsible charge related to this service. The patient expressed understanding and agreed to proceed.  Location of Patient: Home Location of Provider: Home Persons involved: Patient and provider only   History of Present Illness: Follow-up for drug-induced constipation and gastroparesis  HPI: Sarah [name] is a 63 y.o. female   Summary of history :  She was last seen my office on 03/10/2019.  She has a history of multiple myeloma in remission.  She has had history of diarrhea alternating with constipation since 2018 when she was seen at my office. 05/07/2017: EGD: Biopsies of stomach showed antral type mucosa with features of a healing mucosal injury.  I also performed a colonoscopy on the same day which was normal.  She is on a fentanyl patch for chronic pain.  And she states that she does not have a bowel movement for up to 7 days and all of a sudden has a huge bowel movement and towards the end is extremely watery.  She has also had a hysterectomy in the past.  She states that preceding the bowel movement she has abdominal distention feels bloated.  She has tried MiraLAX, docusate, senna in the past has not worked.  She has tried fiber supplements and have caused diarrhea at times.  She denies use of any NSAIDs.  She is on metformin.  She also complains of chronic nausea and states that it begins shortly after she  eats.  She says that she feels that the food sits in her stomach for a long.   Interval history 03/10/2019-03/23/2019  After her last visit she commenced on a high-fiber diet and states that it causes significant diarrhea.  And she stopped taking the high-fiber diet as well as the fiber pills.  Since then she says she is been having constipation.  She says that she has tried MiraLAX in the past which has not worked.  She would like to try something different.    Current Outpatient Medications  Medication Sig Dispense Refill  . allopurinol (ZYLOPRIM) 100 MG tablet TAKE 1 TABLET(100 MG) BY MOUTH DAILY as needed (Patient not taking: Reported on 02/08/2019) 90 tablet 1  . aspirin 81 MG chewable tablet Chew 1 tablet (81 mg total) by mouth daily. 30 tablet 0  . bisoprolol (ZEBETA) 10 MG tablet TAKE 1 TABLET(10 MG) BY MOUTH DAILY 90 tablet 0  . celecoxib (CELEBREX) 100 MG capsule TAKE 1 CAPSULE(100 MG) BY MOUTH DAILY (Patient not taking: Reported on 03/10/2019) 90 capsule 0  . diazepam (VALIUM) 2 MG tablet Take 1 tablet (2 mg total) by mouth every 12 (twelve) hours as needed for anxiety. (Patient not taking: Reported on 02/08/2019) 60 tablet 0  . diclofenac sodium (VOLTAREN) 1 % GEL Apply 2 g topically 4 (four) times daily. 100 g 1  . diphenoxylate-atropine (LOMOTIL) 2.5-0.025 MG tablet Take 2 tablets by mouth daily as needed for diarrhea or  loose stools. (Patient not taking: Reported on 02/08/2019) 45 tablet 2  . fentaNYL (DURAGESIC - DOSED MCG/HR) 25 MCG/HR patch Place 25 mcg onto the skin every 3 (three) days.     . fexofenadine (ALLEGRA) 180 MG tablet TAKE 1 TABLET(180 MG) BY MOUTH DAILY (Patient not taking: No sig reported) 30 tablet 5  . FLUoxetine (PROZAC) 40 MG capsule Take 1 capsule (40 mg total) by mouth daily. 90 capsule 1  . fluticasone (FLONASE) 50 MCG/ACT nasal spray Place 2 sprays into both nostrils daily. 16 g 2  . furosemide (LASIX) 20 MG tablet Take 20 mg by mouth as needed.     Marland Kitchen glucose  blood (ONE TOUCH ULTRA TEST) test strip     . hydrOXYzine (ATARAX/VISTARIL) 10 MG tablet TAKE 1 TABLET(10 MG) BY MOUTH THREE TIMES DAILY AS NEEDED (Patient not taking: Reported on 02/08/2019) 90 tablet 0  . lidocaine-prilocaine (EMLA) cream Apply 1 application topically as needed. 30 g 0  . lisinopril (PRINIVIL,ZESTRIL) 10 MG tablet Take 10 mg by mouth daily.    . Magnesium Bisglycinate (MAG GLYCINATE) 100 MG TABS Take 100 mg by mouth 2 (two) times daily. (Patient not taking: Reported on 03/10/2019) 60 tablet 0  . MAGNESIUM CL-CALCIUM CARBONATE PO Take 1 tablet by mouth daily.    . Menaquinone-7 (VITAMIN K2) 100 MCG CAPS Take 1 tablet by mouth daily.     . metFORMIN (GLUCOPHAGE-XR) 500 MG 24 hr tablet Take 1 tablet (500 mg total) by mouth daily with breakfast. 90 tablet 1  . Misc Natural Products (OSTEO BI-FLEX ADV TRIPLE ST PO) Take 2 tablets by mouth daily.    . Multiple Vitamins-Minerals (HAIR/SKIN/NAILS/BIOTIN PO) Take 1 capsule by mouth 3 (three) times daily.    . NON FORMULARY     . Omega 3-6-9 Fatty Acids (OMEGA 3-6-9 COMPLEX) CAPS Take by mouth.    Marland Kitchen omeprazole (PRILOSEC) 40 MG capsule Take 1 capsule (40 mg total) by mouth daily. 90 capsule 1  . pravastatin (PRAVACHOL) 20 MG tablet TAKE 1 TABLET BY MOUTH  DAILY 90 tablet 1  . promethazine (PHENERGAN) 25 MG tablet TAKE 1 TABLET BY MOUTH TWICE DAILY BEFORE MEALS AS NEEDED 60 tablet 0  . tiZANidine (ZANAFLEX) 4 MG tablet Take 1 tablet (4 mg total) by mouth every 8 (eight) hours as needed for muscle spasms. 270 tablet 1  . tolterodine (DETROL LA) 4 MG 24 hr capsule Take 1 capsule (4 mg total) by mouth daily. 30 capsule 0  . traZODone (DESYREL) 100 MG tablet Take 1 tablet (100 mg total) by mouth at bedtime. For sleep. 90 tablet 1   No current facility-administered medications for this visit.     Allergies as of 03/23/2019 - Review Complete 03/10/2019  Allergen Reaction Noted  . Oxycodone-acetaminophen Anaphylaxis 09/01/2011  . Celebrex  [celecoxib] Diarrhea 01/04/2019  . Codeine  09/05/2017  . Benadryl [diphenhydramine] Palpitations 04/02/2016  . Morphine Itching and Rash 04/02/2016  . Ondansetron Diarrhea 09/01/2011  . Tylenol [acetaminophen] Itching and Rash 04/02/2016    Review of Systems:    All systems reviewed and negative except where noted in HPI.  General Appearance:    Alert, cooperative, no distress, appears stated age  Head:    Normocephalic, without obvious abnormality, atraumatic  Eyes:    PERRL, conjunctiva/corneas clear,  Ears:    Grossly normal hearing    Neurologic:  Grossly normal    Observations/Objective:  Labs: CMP     Component Value Date/Time   NA 134 (L)  02/01/2019 0842   NA 141 04/19/2016 1604   K 4.4 02/01/2019 0842   CL 105 02/01/2019 0842   CO2 19 (L) 02/01/2019 0842   GLUCOSE 174 (H) 02/01/2019 0842   BUN 28 (H) 02/01/2019 0842   BUN 22 04/19/2016 1604   CREATININE 1.18 (H) 02/01/2019 0842   CALCIUM 9.4 02/01/2019 0842   PROT 7.0 02/01/2019 0842   ALBUMIN 4.3 02/01/2019 0842   AST 34 02/01/2019 0842   ALT 38 02/01/2019 0842   ALKPHOS 95 02/01/2019 0842   BILITOT 0.2 (L) 02/01/2019 0842   GFRNONAA 49 (L) 02/01/2019 0842   GFRAA 57 (L) 02/01/2019 0842   Lab Results  Component Value Date   WBC 5.3 02/01/2019   HGB 11.4 (L) 02/01/2019   HCT 32.5 (L) 02/01/2019   MCV 86.0 02/01/2019   PLT 112 (L) 02/01/2019    Imaging Studies: No results found.  Assessment and Plan:   Cayleigh Paull is a 63 y.o. y/o female here to follow-up for constipation and chronic nausea.    My impression is that her GI symptoms are secondary to chronic fentanyl use causing drug-induced constipation and probably even gastroparesis.  She did not tolerate a high-fiber diet as it caused diarrhea.  She has failed MiraLAX in the past.  I suggested we start her on a low-dose Amitiza 8 mcg and see how she does in 2 weeks time.   I discussed the assessment and treatment plan with the patient. The  patient was provided an opportunity to ask questions and all were answered. The patient agreed with the plan and demonstrated an understanding of the instructions.   The patient was advised to call back or seek an in-person evaluation if the symptoms worsen or if the condition fails to improve as anticipated.   Dr Sarah Bellows MD,MRCP [facility]) Gastroenterology/Hepatology Pager: [phone]   Speech recognition software was used to dictate this note.

## 2019-03-24 LAB — PROTEIN ELECTROPHORESIS, SERUM
A/G Ratio: 1.9 — ABNORMAL HIGH (ref 0.7–1.7)
Albumin ELP: 3.9 g/dL (ref 2.9–4.4)
Alpha-1-Globulin: 0.2 g/dL (ref 0.0–0.4)
Alpha-2-Globulin: 0.8 g/dL (ref 0.4–1.0)
Beta Globulin: 0.8 g/dL (ref 0.7–1.3)
Gamma Globulin: 0.3 g/dL — ABNORMAL LOW (ref 0.4–1.8)
Globulin, Total: 2.1 g/dL — ABNORMAL LOW (ref 2.2–3.9)
Total Protein ELP: 6 g/dL (ref 6.0–8.5)

## 2019-03-29 ENCOUNTER — Inpatient Hospital Stay: Payer: Medicare PPO

## 2019-03-29 ENCOUNTER — Other Ambulatory Visit: Payer: Self-pay

## 2019-03-29 VITALS — Temp 97.8°F | Resp 18

## 2019-03-29 DIAGNOSIS — C9000 Multiple myeloma not having achieved remission: Secondary | ICD-10-CM | POA: Diagnosis not present

## 2019-03-29 DIAGNOSIS — E1122 Type 2 diabetes mellitus with diabetic chronic kidney disease: Secondary | ICD-10-CM | POA: Diagnosis not present

## 2019-03-29 DIAGNOSIS — D631 Anemia in chronic kidney disease: Secondary | ICD-10-CM | POA: Diagnosis not present

## 2019-03-29 DIAGNOSIS — M87180 Osteonecrosis due to drugs, jaw: Secondary | ICD-10-CM | POA: Diagnosis not present

## 2019-03-29 DIAGNOSIS — E538 Deficiency of other specified B group vitamins: Secondary | ICD-10-CM | POA: Diagnosis not present

## 2019-03-29 DIAGNOSIS — N183 Chronic kidney disease, stage 3 (moderate): Secondary | ICD-10-CM | POA: Diagnosis not present

## 2019-03-29 DIAGNOSIS — I129 Hypertensive chronic kidney disease with stage 1 through stage 4 chronic kidney disease, or unspecified chronic kidney disease: Secondary | ICD-10-CM | POA: Diagnosis not present

## 2019-03-29 DIAGNOSIS — C9001 Multiple myeloma in remission: Secondary | ICD-10-CM

## 2019-03-29 DIAGNOSIS — K5900 Constipation, unspecified: Secondary | ICD-10-CM | POA: Diagnosis not present

## 2019-03-29 LAB — MAGNESIUM: Magnesium: 1.2 mg/dL — ABNORMAL LOW (ref 1.7–2.4)

## 2019-03-29 MED ORDER — SODIUM CHLORIDE 0.9% FLUSH
10.0000 mL | INTRAVENOUS | Status: DC | PRN
  Filled 2019-03-29: qty 10

## 2019-03-29 MED ORDER — MAGNESIUM SULFATE 4 GM/100ML IV SOLN
INTRAVENOUS | Status: AC
  Filled 2019-03-29: qty 100

## 2019-03-29 MED ORDER — MAGNESIUM SULFATE 4 GM/100ML IV SOLN
4.0000 g | Freq: Once | INTRAVENOUS | Status: AC
  Administered 2019-03-29: 4 g via INTRAVENOUS

## 2019-03-29 MED ORDER — SODIUM CHLORIDE 0.9 % IV SOLN
Freq: Once | INTRAVENOUS | Status: AC
  Administered 2019-03-29: 10:00:00 via INTRAVENOUS
  Filled 2019-03-29: qty 250

## 2019-03-29 MED ORDER — HEPARIN SOD (PORK) LOCK FLUSH 100 UNIT/ML IV SOLN
500.0000 [IU] | Freq: Once | INTRAVENOUS | Status: AC
  Administered 2019-03-29: 500 [IU] via INTRAVENOUS

## 2019-03-29 NOTE — Patient Instructions (Signed)
Magnesium Sulfate injection What is this medicine? MAGNESIUM SULFATE (mag NEE zee um SUL fate) is an electrolyte injection commonly used to treat low magnesium levels in your blood. It is also used to prevent or control seizures in women with preeclampsia or eclampsia. This medicine may be used for other purposes; ask your health care provider or pharmacist if you have questions. What should I tell my health care provider before I take this medicine? They need to know if you have any of these conditions:  heart disease  history of irregular heart beat  kidney disease  an unusual or allergic reaction to magnesium sulfate, medicines, foods, dyes, or preservatives  pregnant or trying to get pregnant  breast-feeding How should I use this medicine? This medicine is for infusion into a vein. It is given by a health care professional in a hospital or clinic setting. Talk to your pediatrician regarding the use of this medicine in children. While this drug may be prescribed for selected conditions, precautions do apply. Overdosage: If you think you have taken too much of this medicine contact a poison control center or emergency room at once. NOTE: This medicine is only for you. Do not share this medicine with others. What if I miss a dose? This does not apply. What may interact with this medicine? This medicine may interact with the following medications:  certain medicines for anxiety or sleep  certain medicines for seizures like phenobarbital  digoxin  medicines that relax muscles for surgery  narcotic medicines for pain This list may not describe all possible interactions. Give your health care provider a list of all the medicines, herbs, non-prescription drugs, or dietary supplements you use. Also tell them if you smoke, drink alcohol, or use illegal drugs. Some items may interact with your medicine. What should I watch for while using this medicine? Your condition will be  monitored carefully while you are receiving this medicine. You may need blood work done while you are receiving this medicine. What side effects may I notice from receiving this medicine? Side effects that you should report to your doctor or health care professional as soon as possible:  allergic reactions like skin rash, itching or hives, swelling of the face, lips, or tongue  facial flushing  muscle weakness  signs and symptoms of low blood pressure like dizziness; feeling faint or lightheaded, falls; unusually weak or tired  signs and symptoms of a dangerous change in heartbeat or heart rhythm like chest pain; dizziness; fast or irregular heartbeat; palpitations; breathing problems  sweating This list may not describe all possible side effects. Call your doctor for medical advice about side effects. You may report side effects to FDA at 1-800-FDA-1088. Where should I keep my medicine? This drug is given in a hospital or clinic and will not be stored at home. NOTE: This sheet is a summary. It may not cover all possible information. If you have questions about this medicine, talk to your doctor, pharmacist, or health care provider.  2020 Elsevier/Gold Standard (2016-01-03 12:31:42)  

## 2019-04-04 NOTE — Progress Notes (Signed)
[facility]  [address] [place], [place] [address] Phone: [phone]  Fax: [phone]   Clinic Day:  04/05/2019  Referring physician: Glean Hess, MD  Chief Complaint: Sarah [name] is a 63 y.o. 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 At that time, her nausea had worsened since her gallbladder surgery.  She had chronic cramps in her legs at night.  Magnesium was 1.4  Pt underwent port-a-cath insertion on 03/10/2019 by Dr. Leotis Pain.   She was seen for drug induced constipation by Dr. Vicente Males on 03/10/2019.  She is on a Fentanyl patch for chronic pain and stated that she had not had a bowel movement for up to 7 days and all of a sudden had a huge bowel movement and towards the end is extremely watery. Patient was started on high fiber diet with some fiber supplements. If this does not work, Dr. Vicente Males considered starting her on Amitiza low-dose at her subsequent visit.  Magnesium followed:  1.1 on 02/15/2019, 1.5 on 02/18/2019, 1.5 on 02/22/2019, 1.0 on 03/01/2019, 1.6 on 03/04/2019, 1.2 on 03/09/2019, 1.3 on 03/15/2019, 1.1 on 03/22/2019, and 1.2 on 03/29/2019.  She received 4 gm of magnesium on each day.  Labs on 03/22/2019 included an M-spike of 0.  Kappa free light chains were 14.8, lambda free light chains 363.6 with a ratio of 0.04..  During the interim, she has had recent episodes of "non-stop diarrhea" due to [place].  She stopped taking Amitiza.  She was given a 2 week sample and during second week diarrhea started getting worse. She took kaopeptate and then Lomotil.  She has a televisit with Dr. Vicente Males tomorrow. She would like some IV fluids today.  She notes weight loss; she can't wear her rings.  She describes bone aches in her legs as if she had a cold. Over the weekend  with constant diarrhea, she had low grade fever.  She has been very nauseous lately.  She describes 8 pinched nerves in her back and 3 pinched nerves in her neck. She has neuropathy is in her hands and numbness in her feet.   Past Medical History:  Diagnosis Date   Abnormal stress test 02/14/2016   Overview:  Added automatically from request for surgery [id number]   Anemia    Anxiety    Arthritis    Bicuspid aortic valve    CHF (congestive heart failure) (HCC)    CKD (chronic kidney disease) stage 3, GFR 30-59 ml/min (HCC)    Depression    Diabetes mellitus ([place])    Dizziness    Fatty liver    Frequent falls    GERD (gastroesophageal reflux disease)    Gout    Heart murmur    History of blood transfusion    History of bone marrow transplant ([place])    History of uterine fibroid    Hx of cardiac catheterization 06/05/2016   Overview:  Normal coronaries 2017   Hypertension    Hypomagnesemia    Multiple myeloma (HCC)    Personal history of chemotherapy    Renal cyst     Past Surgical History:  Procedure Laterality Date   ABDOMINAL HYSTERECTOMY     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   CARDIAC ELECTROPHYSIOLOGY MAPPING AND ABLATION     CARPAL TUNNEL RELEASE  CHOLECYSTECTOMY  2008  °• COLONOSCOPY WITH PROPOFOL N/A 05/07/2017  ° Procedure: COLONOSCOPY WITH PROPOFOL;  Surgeon: Anna, Kiran, MD;  Location: [facility] ENDOSCOPY;  Service: Gastroenterology;  Laterality: N/A;  °• ESOPHAGOGASTRODUODENOSCOPY (EGD) WITH PROPOFOL N/A 05/07/2017  ° Procedure: ESOPHAGOGASTRODUODENOSCOPY (EGD) WITH PROPOFOL;  Surgeon: Anna, Kiran, MD;  Location: [facility] ENDOSCOPY;  Service: Gastroenterology;  Laterality: N/A;  °• FOOT SURGERY Bilateral   °• INCONTINENCE SURGERY  2009  °• PARTIAL HYSTERECTOMY  03/1996  ° fibroids  °• PORTA CATH INSERTION N/A 03/10/2019  ° Procedure: PORTA CATH INSERTION;  Surgeon: Dew, Jason S, MD;  Location: [facility] INVASIVE CV LAB;  Service: Cardiovascular;   Laterality: N/A;  °• TONSILLECTOMY  2007  ° ° °Family History  °Problem Relation Age of Onset  °• Colon cancer Father   °• Renal Disease Father   °• Diabetes Mellitus II Father   °• Melanoma Paternal Grandmother   °• Breast cancer Maternal Aunt 50  °• Anemia Mother   °• Heart disease Mother   °• Heart failure Mother   °• Renal Disease Mother   °• Congestive Heart Failure Mother   °• Heart disease Maternal Uncle   °• Throat cancer Maternal Uncle   °• Lung cancer Maternal Uncle   °• Liver disease Maternal Uncle   °• Heart failure Maternal Uncle   °• Hearing loss Son 18  °     Suicide   ° ° °Social History:  reports that she quit smoking about 27 years ago. Her smoking use included cigarettes. She has a 20.00 pack-year smoking history. She has never used smokeless tobacco. She reports previous alcohol use. She reports that she does not use drugs.  Patient has not had ETOH in several months. She is on disability. She notes exposure to perchloroethylene (PERC).  She lives in [place]. The patient is alone today. ° °Allergies:  °Allergies  °Allergen Reactions  °• Oxycodone-Acetaminophen Anaphylaxis  °  Swelling and rash  °• Celebrex [Celecoxib] Diarrhea  °• Codeine   °• Benadryl [Diphenhydramine] Palpitations  °• Morphine Itching and Rash  °• Ondansetron Diarrhea  °• Tylenol [Acetaminophen] Itching and Rash  ° ° °Current Medications: °Current Outpatient Medications  °Medication Sig Dispense Refill  °• allopurinol (ZYLOPRIM) 100 MG tablet TAKE 1 TABLET(100 MG) BY MOUTH DAILY as needed 90 tablet 1  °• aspirin 81 MG chewable tablet Chew 1 tablet (81 mg total) by mouth daily. 30 tablet 0  °• bisoprolol (ZEBETA) 10 MG tablet TAKE 1 TABLET(10 MG) BY MOUTH DAILY 90 tablet 0  °• diclofenac sodium (VOLTAREN) 1 % GEL Apply 2 g topically 4 (four) times daily. 100 g 1  °• diphenoxylate-atropine (LOMOTIL) 2.5-0.025 MG tablet Take 2 tablets by mouth daily as needed for diarrhea or loose stools. 45 tablet 2  °• fentaNYL (DURAGESIC - DOSED  MCG/HR) 25 MCG/HR patch Place 25 mcg onto the skin every 3 (three) days.     °• fexofenadine (ALLEGRA) 180 MG tablet TAKE 1 TABLET(180 MG) BY MOUTH DAILY 30 tablet 5  °• FLUoxetine (PROZAC) 40 MG capsule Take 1 capsule (40 mg total) by mouth daily. 90 capsule 1  °• fluticasone (FLONASE) 50 MCG/ACT nasal spray Place 2 sprays into both nostrils daily. 16 g 2  °• furosemide (LASIX) 20 MG tablet Take 20 mg by mouth as needed.     °• glucose blood (ONE TOUCH ULTRA TEST) test strip     °• hydrOXYzine (ATARAX/VISTARIL) 10 MG tablet TAKE 1 TABLET(10 MG) BY MOUTH THREE TIMES DAILY AS NEEDED   90 tablet 0  °• lidocaine-prilocaine (EMLA) cream Apply 1 application topically as needed. 30 g 0  °• lisinopril (PRINIVIL,ZESTRIL) 10 MG tablet Take 10 mg by mouth daily.    °• Magnesium Bisglycinate (MAG GLYCINATE) 100 MG TABS Take 100 mg by mouth 2 (two) times daily. 60 tablet 0  °• Menaquinone-7 (VITAMIN K2) 100 MCG CAPS Take 1 tablet by mouth daily.     °• metFORMIN (GLUCOPHAGE-XR) 500 MG 24 hr tablet Take 1 tablet (500 mg total) by mouth daily with breakfast. 90 tablet 1  °• Misc Natural Products (OSTEO BI-FLEX ADV TRIPLE ST PO) Take 2 tablets by mouth daily.    °• Multiple Vitamins-Minerals (HAIR/SKIN/NAILS/BIOTIN PO) Take 1 capsule by mouth 3 (three) times daily.    °• NON FORMULARY     °• Omega 3-6-9 Fatty Acids (OMEGA 3-6-9 COMPLEX) CAPS Take by mouth.    °• omeprazole (PRILOSEC) 40 MG capsule Take 1 capsule (40 mg total) by mouth daily. 90 capsule 1  °• pravastatin (PRAVACHOL) 20 MG tablet TAKE 1 TABLET BY MOUTH  DAILY 90 tablet 1  °• promethazine (PHENERGAN) 25 MG tablet TAKE 1 TABLET BY MOUTH TWICE DAILY BEFORE MEALS AS NEEDED 60 tablet 0  °• tiZANidine (ZANAFLEX) 4 MG tablet Take 1 tablet (4 mg total) by mouth every 8 (eight) hours as needed for muscle spasms. 270 tablet 1  °• traZODone (DESYREL) 100 MG tablet Take 1 tablet (100 mg total) by mouth at bedtime. For sleep. 90 tablet 1  ° °No current facility-administered  medications for this visit.   ° °Facility-Administered Medications Ordered in Other Visits  °Medication Dose Route Frequency Provider Last Rate Last Dose  °• heparin lock flush 100 unit/mL  500 Units Intravenous Once Bernardina Cacho C, MD      °• sodium chloride flush (NS) 0.9 % injection 10 mL  10 mL Intravenous PRN Jahayra Mazo C, MD      ° ° °Review of Systems  °Constitutional: Positive for weight loss (5-7 lbs). Negative for chills, diaphoresis, fever and malaise/fatigue.  °     Feels "ok".  °HENT: Negative.  Negative for congestion, hearing loss, nosebleeds, sinus pain and sore throat.   °Eyes: Negative.  Negative for blurred vision and double vision.  °Respiratory: Negative.  Negative for cough, shortness of breath and wheezing.   °Cardiovascular: Negative.  Negative for chest pain, palpitations, orthopnea, leg swelling and PND.  °Gastrointestinal: Positive for diarrhea, heartburn and nausea (chronic s/p gallbladder surgery). Negative for abdominal pain, blood in stool, constipation, melena and vomiting.  °     No appetite.  °Genitourinary: Negative for dysuria, frequency, hematuria and urgency.  °     Bladder leakage (f/u urology bladder sling).  °Musculoskeletal: Positive for back pain (chronic, spinal stenosis and pinched nerve), joint pain (arthritis), myalgias (leg cramps, worse at night) and neck pain (3 pinched nerves in neck).  °Skin: Negative.  Negative for rash.  °Neurological: Positive for sensory change (neuropathy in hands and feet). Negative for dizziness, speech change, focal weakness, weakness and headaches.  °Endo/Heme/Allergies: Does not bruise/bleed easily.  °     Diabetes.  °Psychiatric/Behavioral: Negative.  Negative for depression and memory loss. The patient is not nervous/anxious and does not have insomnia.   °All other systems reviewed and are negative. ° °Performance status (ECOG): 1 - Symptomatic but completely ambulatory ° °Vitals °Blood pressure 93/67, pulse 69, temperature  (!) 97.5 °F (36.4 °C), temperature source Tympanic, resp. rate 16, weight 150 lb 0.4 oz (68.1   kg), SpO2 99 %.  ° °Physical Exam  °Constitutional: She is oriented to person, place, and time. She appears well-developed and well-nourished. No distress.  °HENT:  °Head: Normocephalic and atraumatic.  °Mouth/Throat: Oropharynx is clear and moist.  °Curly dark blonde hair pulled up.  Dry mouth.  Mask.  °Eyes: Pupils are equal, round, and reactive to light. Conjunctivae and EOM are normal. No scleral icterus.  °Glasses.  °Neck: Normal range of motion. Neck supple. No JVD present.  °Cardiovascular: Normal rate, regular rhythm and normal heart sounds.  °No murmur heard. °Pulmonary/Chest: Effort normal and breath sounds normal. No respiratory distress. She has no wheezes.  °Abdominal: Soft. Bowel sounds are normal. She exhibits no distension and no mass. There is no abdominal tenderness. There is no rebound and no guarding.  °Musculoskeletal: Normal range of motion.     °   General: Tenderness (bilateral lower extremities) present. No edema.  °   Right knee: She exhibits no swelling.  °   Left knee: She exhibits no swelling.  °   Right ankle: She exhibits no swelling.  °   Left ankle: She exhibits no swelling.  °   Thoracic back: Tenderness: slight.  °Lymphadenopathy:  °     Head (right side): No submandibular, no preauricular and no posterior auricular adenopathy present.  °     Head (left side): No submandibular, no preauricular and no posterior auricular adenopathy present.  °  She has no cervical adenopathy.  °  She has no axillary adenopathy.  °     Right: No inguinal and no supraclavicular adenopathy present.  °     Left: No inguinal and no supraclavicular adenopathy present.  °Neurological: She is alert and oriented to person, place, and time.  °Skin: Skin is warm and dry. She is not diaphoretic.  °Psychiatric: She has a normal mood and affect. Her behavior is normal. Judgment and thought content normal.  °Nursing note  and vitals reviewed. ° ° °No visits with results within 3 Day(s) from this visit.  °Latest known visit with results is:  °Infusion on 03/29/2019  °Component Date Value Ref Range Status  °• Magnesium 03/29/2019 1.2* 1.7 - 2.4 mg/dL Final  ° Performed at [place] [facility], [address] [place], [place] [address]  ° ° °Assessment:  Sarah [name] is a 63 y.o. 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  Bone marrow revealed 80% plasma cells.  She initially underwent induction with RVD.  Revlimid maintenance was discontinued on 01/21/2017 secondary to intolerance.   °  °M-spike has been followed:  0 on 04/02/2016 - 04/05/2019; and 0.2 on 09/02/2017. °  °Lambda light chains have been followed: 22.2 (ratio 0.56) on 07/03/2017, 30.8 (ratio 0.78) on 09/02/2017, 36.9 (ratio 0.40) on 10/21/2017, 37.4 (ratio 0.41) on 12/16/2017, 70.7(ratio 0.31)  on 02/17/2018, 64.2 (ratio 0.27) on 04/07/2018, 78.9 (ratio 0.18) on 05/26/2018, 128.8 (ratio 0.17) on 08/06/2018, 181.5 (ratio 0.13) on 10/08/2018, 130.9 (ratio 0.13) on 10/20/2018, 160.7 (ratio 0.10) on 12/09/2018, 236.6 (ratio 0.07) on 02/01/2019, 363.6 (ratio 0.04) on 03/22/2019, and 404.8 (ratio 0.04) on 04/05/2019.  °  °Bone survey on 04/08/2016 and 05/28/2017 revealed no definite lytic lesion seen in the visualized skeleton.  Bone survey on 11/19/2018 revealed no suspicious lucent lesions and no acute bony abnormality. 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  Zometa was discontinued in 01/2017. °  °She has B12 deficiency.  B12 was 254 on 04/09/2017, 295 on 08/20/2018, and 391 on 10/08/2018.  She is on oral   She is on oral B12.  Folate was 21.6 on 08/20/2018.   Symptomatically, she is slightly fatigued.  She has lost 7 pounds secondary to recent diarrhea.  Exam is stable.  Magnesium is 1.1.  Potassium is 4.0.  Plan: 1.   Labs today:  CBC with diff,  CMP, Mg, SPEP, FLCA. 2. Stage III multiple myeloma Clinically, she is doing well.              She is s/p autologous stem cell transplant in 06/2015.              She is off maintenance Revlimid secondary to intolerance.               M spike was 0 today. Lambda light chains were 404.8 (ratio 0.04) today.             Bone survey on 11/19/2018 revealed no lytic lesions. Continue close surveillance. 3. Normocytic anemia Hematocrit 31.8. Hemoglobin 10.9. MCV 85.9.  Platelets 114,000. Ferritin 67 with an iron saturation of 19% and TIBC 340 on 02/08/2019.             B12 248 (low) and folate 18.6.               TSH was normal on 08/20/2018              Creatinine 1.62 (CrCl 32.6 ml/minute)  secondary to dehydration.             Retic 1.3%. Anemia is likely multifactorial with some component of chronic renal disease.  Continue to monitor. 4. Stage III chronic kidney disease BUN31. Creatinine1.62 today.  Patient has some component of dehydration secondary to recent diarrhea. Creatinine has fluctuated between 1.07 - 1.66 in the past 6 months.              She is followed by Dr. Holley Raring. 5. Hypomagnesemia, chronic Magnesium 1.1 today. Magnesium has fluctuated between 1.1 - 1.4 in the past 2 months.             Magnesium sulfate 4 g IV today.   RTC weekly x 8 for labs (Mg) and+/- IV magnesium.  6. Osteonecrosis of the jaw ONJ occurred in 01/2017 secondary to Zometa. She receives no further bisphosphonates. 7. Chronic back pain Etiology is not secondary to multiple myeloma. She is followed by the [facility] pain clinic and is on Fentanyl patches. 8. Grade I-II peripheral neuropathy Etiology is secondary to prior chemotherapy. Continue to monitor. 9.Chronic  nausea Patient is followed by Dr. Vicente Males. Rx: Phenergan 25 mg p.o. BID before meals prn. 10.   IVF (one liter NS) and 4 gm magnesium sulfate IV today. 11.   RTC weekly x 8 for labs (Mg) and+/- IV magnesium.  12.   RTC in 8 weeks for MD assessment, labs (CBC with diff, CMP, SPEP, FLCA, Mg), and IV Mg.  I discussed the assessment and treatment plan with the patient.  The patient was provided an opportunity to ask questions and all were answered.  The patient agreed with the plan and demonstrated an understanding of the instructions.  The patient was advised to call back if the symptoms worsen or if the condition fails to improve as anticipated.  I provided 17 minutes (10:45 AM - 11:02 AM) of face-to-face time during this this encounter and > 50% was spent counseling as documented under my assessment and plan.    Lequita Asal, MD, PhD    04/05/2019, 10:02 AM  I,  as a scribe for Melissa C Corcoran, MD. ° °I, Melissa C. Corcoran, MD, have reviewed the above documentation for accuracy and completeness, and I agree with the above. °

## 2019-04-05 ENCOUNTER — Inpatient Hospital Stay: Payer: Medicare PPO

## 2019-04-05 ENCOUNTER — Inpatient Hospital Stay: Payer: Medicare PPO | Attending: Hematology and Oncology | Admitting: Hematology and Oncology

## 2019-04-05 ENCOUNTER — Other Ambulatory Visit: Payer: Self-pay

## 2019-04-05 VITALS — BP 93/67 | HR 69 | Temp 97.5°F | Resp 16 | Wt 150.0 lb

## 2019-04-05 DIAGNOSIS — Z79899 Other long term (current) drug therapy: Secondary | ICD-10-CM | POA: Insufficient documentation

## 2019-04-05 DIAGNOSIS — C9000 Multiple myeloma not having achieved remission: Secondary | ICD-10-CM | POA: Insufficient documentation

## 2019-04-05 DIAGNOSIS — R634 Abnormal weight loss: Secondary | ICD-10-CM | POA: Insufficient documentation

## 2019-04-05 DIAGNOSIS — R197 Diarrhea, unspecified: Secondary | ICD-10-CM | POA: Insufficient documentation

## 2019-04-05 DIAGNOSIS — Z885 Allergy status to narcotic agent status: Secondary | ICD-10-CM | POA: Diagnosis not present

## 2019-04-05 DIAGNOSIS — I130 Hypertensive heart and chronic kidney disease with heart failure and stage 1 through stage 4 chronic kidney disease, or unspecified chronic kidney disease: Secondary | ICD-10-CM | POA: Insufficient documentation

## 2019-04-05 DIAGNOSIS — G620 Drug-induced polyneuropathy: Secondary | ICD-10-CM

## 2019-04-05 DIAGNOSIS — R110 Nausea: Secondary | ICD-10-CM | POA: Insufficient documentation

## 2019-04-05 DIAGNOSIS — N183 Chronic kidney disease, stage 3 unspecified: Secondary | ICD-10-CM | POA: Diagnosis not present

## 2019-04-05 DIAGNOSIS — C9001 Multiple myeloma in remission: Secondary | ICD-10-CM | POA: Diagnosis not present

## 2019-04-05 DIAGNOSIS — Z800 Family history of malignant neoplasm of digestive organs: Secondary | ICD-10-CM | POA: Insufficient documentation

## 2019-04-05 DIAGNOSIS — G8929 Other chronic pain: Secondary | ICD-10-CM | POA: Diagnosis not present

## 2019-04-05 DIAGNOSIS — Z803 Family history of malignant neoplasm of breast: Secondary | ICD-10-CM | POA: Insufficient documentation

## 2019-04-05 DIAGNOSIS — E1122 Type 2 diabetes mellitus with diabetic chronic kidney disease: Secondary | ICD-10-CM | POA: Diagnosis not present

## 2019-04-05 DIAGNOSIS — Z8249 Family history of ischemic heart disease and other diseases of the circulatory system: Secondary | ICD-10-CM | POA: Insufficient documentation

## 2019-04-05 DIAGNOSIS — E1140 Type 2 diabetes mellitus with diabetic neuropathy, unspecified: Secondary | ICD-10-CM | POA: Diagnosis not present

## 2019-04-05 DIAGNOSIS — Z886 Allergy status to analgesic agent status: Secondary | ICD-10-CM | POA: Diagnosis not present

## 2019-04-05 DIAGNOSIS — Z9484 Stem cells transplant status: Secondary | ICD-10-CM | POA: Insufficient documentation

## 2019-04-05 DIAGNOSIS — M549 Dorsalgia, unspecified: Secondary | ICD-10-CM | POA: Diagnosis not present

## 2019-04-05 DIAGNOSIS — M879 Osteonecrosis, unspecified: Secondary | ICD-10-CM | POA: Insufficient documentation

## 2019-04-05 DIAGNOSIS — F329 Major depressive disorder, single episode, unspecified: Secondary | ICD-10-CM | POA: Diagnosis not present

## 2019-04-05 DIAGNOSIS — M1990 Unspecified osteoarthritis, unspecified site: Secondary | ICD-10-CM | POA: Insufficient documentation

## 2019-04-05 DIAGNOSIS — R252 Cramp and spasm: Secondary | ICD-10-CM | POA: Insufficient documentation

## 2019-04-05 DIAGNOSIS — Z833 Family history of diabetes mellitus: Secondary | ICD-10-CM | POA: Insufficient documentation

## 2019-04-05 DIAGNOSIS — Z888 Allergy status to other drugs, medicaments and biological substances status: Secondary | ICD-10-CM | POA: Insufficient documentation

## 2019-04-05 DIAGNOSIS — Z23 Encounter for immunization: Secondary | ICD-10-CM | POA: Diagnosis not present

## 2019-04-05 DIAGNOSIS — E538 Deficiency of other specified B group vitamins: Secondary | ICD-10-CM | POA: Insufficient documentation

## 2019-04-05 DIAGNOSIS — Z801 Family history of malignant neoplasm of trachea, bronchus and lung: Secondary | ICD-10-CM | POA: Insufficient documentation

## 2019-04-05 DIAGNOSIS — R112 Nausea with vomiting, unspecified: Secondary | ICD-10-CM

## 2019-04-05 DIAGNOSIS — Z822 Family history of deafness and hearing loss: Secondary | ICD-10-CM | POA: Insufficient documentation

## 2019-04-05 DIAGNOSIS — Z7984 Long term (current) use of oral hypoglycemic drugs: Secondary | ICD-10-CM | POA: Insufficient documentation

## 2019-04-05 DIAGNOSIS — Z808 Family history of malignant neoplasm of other organs or systems: Secondary | ICD-10-CM | POA: Insufficient documentation

## 2019-04-05 DIAGNOSIS — E860 Dehydration: Secondary | ICD-10-CM | POA: Insufficient documentation

## 2019-04-05 DIAGNOSIS — D631 Anemia in chronic kidney disease: Secondary | ICD-10-CM | POA: Insufficient documentation

## 2019-04-05 DIAGNOSIS — T451X5A Adverse effect of antineoplastic and immunosuppressive drugs, initial encounter: Secondary | ICD-10-CM | POA: Diagnosis not present

## 2019-04-05 DIAGNOSIS — Z9481 Bone marrow transplant status: Secondary | ICD-10-CM | POA: Insufficient documentation

## 2019-04-05 LAB — COMPREHENSIVE METABOLIC PANEL
ALT: 37 U/L (ref 0–44)
AST: 36 U/L (ref 15–41)
Albumin: 4.2 g/dL (ref 3.5–5.0)
Alkaline Phosphatase: 86 U/L (ref 38–126)
Anion gap: 11 (ref 5–15)
BUN: 31 mg/dL — ABNORMAL HIGH (ref 8–23)
CO2: 21 mmol/L — ABNORMAL LOW (ref 22–32)
Calcium: 8.8 mg/dL — ABNORMAL LOW (ref 8.9–10.3)
Chloride: 104 mmol/L (ref 98–111)
Creatinine, Ser: 1.62 mg/dL — ABNORMAL HIGH (ref 0.44–1.00)
GFR calc Af Amer: 39 mL/min — ABNORMAL LOW (ref >60)
GFR calc non Af Amer: 34 mL/min — ABNORMAL LOW (ref >60)
Glucose, Bld: 143 mg/dL — ABNORMAL HIGH (ref 70–99)
Potassium: 4 mmol/L (ref 3.5–5.1)
Sodium: 136 mmol/L (ref 135–145)
Total Bilirubin: 0.4 mg/dL (ref 0.3–1.2)
Total Protein: 7.1 g/dL (ref 6.5–8.1)

## 2019-04-05 LAB — CBC WITH DIFFERENTIAL/PLATELET
Abs Immature Granulocytes: 0.02 10*3/uL (ref 0.00–0.07)
Basophils Absolute: 0 10*3/uL (ref 0.0–0.1)
Basophils Relative: 0 %
Eosinophils Absolute: 0.1 10*3/uL (ref 0.0–0.5)
Eosinophils Relative: 2 %
HCT: 31.8 % — ABNORMAL LOW (ref 36.0–46.0)
Hemoglobin: 10.9 g/dL — ABNORMAL LOW (ref 12.0–15.0)
Immature Granulocytes: 0 %
Lymphocytes Relative: 19 %
Lymphs Abs: 1 10*3/uL (ref 0.7–4.0)
MCH: 29.5 pg (ref 26.0–34.0)
MCHC: 34.3 g/dL (ref 30.0–36.0)
MCV: 85.9 fL (ref 80.0–100.0)
Monocytes Absolute: 0.5 10*3/uL (ref 0.1–1.0)
Monocytes Relative: 9 %
Neutro Abs: 3.6 10*3/uL (ref 1.7–7.7)
Neutrophils Relative %: 70 %
Platelets: 114 10*3/uL — ABNORMAL LOW (ref 150–400)
RBC: 3.7 MIL/uL — ABNORMAL LOW (ref 3.87–5.11)
RDW: 13.2 % (ref 11.5–15.5)
WBC: 5.3 10*3/uL (ref 4.0–10.5)
nRBC: 0 % (ref 0.0–0.2)

## 2019-04-05 LAB — MAGNESIUM: Magnesium: 1.1 mg/dL — ABNORMAL LOW (ref 1.7–2.4)

## 2019-04-05 MED ORDER — PROMETHAZINE HCL 25 MG PO TABS: ORAL_TABLET | ORAL | 0 refills | Status: DC

## 2019-04-05 MED ORDER — SODIUM CHLORIDE 0.9 % IV SOLN
Freq: Once | INTRAVENOUS | Status: DC
  Filled 2019-04-05: qty 1000

## 2019-04-05 MED ORDER — HEPARIN SOD (PORK) LOCK FLUSH 100 UNIT/ML IV SOLN
500.0000 [IU] | Freq: Once | INTRAVENOUS | Status: AC
  Administered 2019-04-05: 500 [IU] via INTRAVENOUS

## 2019-04-05 MED ORDER — SODIUM CHLORIDE 0.9 % IV SOLN
Freq: Once | INTRAVENOUS | Status: AC
  Administered 2019-04-05: 11:00:00 via INTRAVENOUS
  Filled 2019-04-05: qty 1000

## 2019-04-05 MED ORDER — SODIUM CHLORIDE 0.9% FLUSH
10.0000 mL | INTRAVENOUS | Status: DC | PRN
  Administered 2019-04-05: 10 mL via INTRAVENOUS
  Filled 2019-04-05: qty 10

## 2019-04-05 MED ORDER — SODIUM CHLORIDE 0.9 % IV SOLN
Freq: Once | INTRAVENOUS | Status: AC
  Administered 2019-04-05: 10:00:00 via INTRAVENOUS
  Filled 2019-04-05: qty 250

## 2019-04-05 NOTE — Progress Notes (Signed)
Patient here for follow up. Reports she has decreased energy.

## 2019-04-06 ENCOUNTER — Ambulatory Visit (INDEPENDENT_AMBULATORY_CARE_PROVIDER_SITE_OTHER): Payer: Medicare PPO | Admitting: Gastroenterology

## 2019-04-06 ENCOUNTER — Other Ambulatory Visit: Payer: Self-pay | Admitting: Hematology and Oncology

## 2019-04-06 ENCOUNTER — Encounter: Payer: Self-pay | Admitting: Gastroenterology

## 2019-04-06 DIAGNOSIS — I350 Nonrheumatic aortic (valve) stenosis: Secondary | ICD-10-CM | POA: Diagnosis not present

## 2019-04-06 DIAGNOSIS — Q231 Congenital insufficiency of aortic valve: Secondary | ICD-10-CM | POA: Diagnosis not present

## 2019-04-06 DIAGNOSIS — I428 Other cardiomyopathies: Secondary | ICD-10-CM | POA: Diagnosis not present

## 2019-04-06 DIAGNOSIS — K5903 Drug induced constipation: Secondary | ICD-10-CM

## 2019-04-06 DIAGNOSIS — C9001 Multiple myeloma in remission: Secondary | ICD-10-CM

## 2019-04-06 DIAGNOSIS — I10 Essential (primary) hypertension: Secondary | ICD-10-CM | POA: Diagnosis not present

## 2019-04-06 LAB — KAPPA/LAMBDA LIGHT CHAINS
Kappa free light chain: 15.9 mg/L (ref 3.3–19.4)
Kappa, lambda light chain ratio: 0.04 — ABNORMAL LOW (ref 0.26–1.65)
Lambda free light chains: 404.8 mg/L — ABNORMAL HIGH (ref 5.7–26.3)

## 2019-04-06 LAB — PROTEIN ELECTROPHORESIS, SERUM
A/G Ratio: 1.6 (ref 0.7–1.7)
Albumin ELP: 3.9 g/dL (ref 2.9–4.4)
Alpha-1-Globulin: 0.2 g/dL (ref 0.0–0.4)
Alpha-2-Globulin: 0.9 g/dL (ref 0.4–1.0)
Beta Globulin: 0.8 g/dL (ref 0.7–1.3)
Gamma Globulin: 0.4 g/dL (ref 0.4–1.8)
Globulin, Total: 2.5 g/dL (ref 2.2–3.9)
Total Protein ELP: 6.4 g/dL (ref 6.0–8.5)

## 2019-04-06 MED ORDER — LACTULOSE 10 GM/15ML PO SOLN: 10.0000 g | Freq: Every day | ORAL | 1 refills | Status: AC

## 2019-04-06 NOTE — Progress Notes (Signed)
Sarah [name] , MD [address]  [place]  [place], [place] [address]  Main: [phone]  Fax: [phone]   Primary Care Physician: Sarah Hess, MD  Virtual Visit via Video Note  I connected with patient on 04/06/19 at  2:30 PM EDT by video and verified that I am speaking with the correct person using two identifiers.   I discussed the limitations, risks, security and privacy concerns of performing an evaluation and management service by video  and the availability of in person appointments. I also discussed with the patient that there may be a patient responsible charge related to this service. The patient expressed understanding and agreed to proceed.  Location of Patient: Home Location of Provider: Home Persons involved: Patient and provider only   History of Present Illness: Chief Complaint  Patient presents with   Follow-up Constipation    did okay the first week then experienced uncontrollable diarrhea    HPI: Sarah [name] is a 63 y.o. female  Summary of history :  She was last seen in my office on 03/23/2019..  I have seen her back in 2018 for history of diarrhea alternating with constipation. She has a history of multiple myeloma in remission.  She has a history of constipation, has been using fentanyl patch for chronic pain.  History of a hysterectomy.  Bowel movements are preceded by abdominal distention and bloating.  Has tried MiraLAX, fiber supplements docusate, senna which has not worked.  Denies use of any NSAIDs she is on metformin.  Complains of chronic nausea that began shortly after she eats.  You have a sensation of food sitting in her stomach for a long period of time.  We did initially try to put her on a high-fiber diet but it caused her to get significant diarrhea and it was stopped.  05/07/2017: EGD: Biopsies of stomach showed antral type mucosa with features of a healing mucosal injury. I also performed a colonoscopy on the same day  which was normal.  Interval history 03/23/2019-04/06/2019  After be commenced on Amitiza at her last office visit she states that she had severe diarrhea requiring her to get IV fluids at the cancer center and since then she has stopped the Amitiza.  She is not on any medications for her constipation at this point of time.  In fact she will take a bit of Imodium recently.   Current Outpatient Medications  Medication Sig Dispense Refill   aspirin 81 MG chewable tablet Chew 1 tablet (81 mg total) by mouth daily. 30 tablet 0   bisoprolol (ZEBETA) 10 MG tablet TAKE 1 TABLET(10 MG) BY MOUTH DAILY 90 tablet 0   diclofenac sodium (VOLTAREN) 1 % GEL Apply 2 g topically 4 (four) times daily. 100 g 1   diphenoxylate-atropine (LOMOTIL) 2.5-0.025 MG tablet Take 2 tablets by mouth daily as needed for diarrhea or loose stools. 45 tablet 2   fentaNYL (DURAGESIC - DOSED MCG/HR) 25 MCG/HR patch Place 25 mcg onto the skin every 3 (three) days.      fexofenadine (ALLEGRA) 180 MG tablet TAKE 1 TABLET(180 MG) BY MOUTH DAILY 30 tablet 5   FLUoxetine (PROZAC) 40 MG capsule Take 1 capsule (40 mg total) by mouth daily. 90 capsule 1   fluticasone (FLONASE) 50 MCG/ACT nasal spray Place 2 sprays into both nostrils daily. 16 g 2   furosemide (LASIX) 20 MG tablet Take 20 mg by mouth as needed.      glucose blood (ONE TOUCH  ULTRA TEST) test strip      hydrOXYzine (ATARAX/VISTARIL) 10 MG tablet TAKE 1 TABLET(10 MG) BY MOUTH THREE TIMES DAILY AS NEEDED 90 tablet 0   lidocaine-prilocaine (EMLA) cream Apply 1 application topically as needed. 30 g 0   lisinopril (PRINIVIL,ZESTRIL) 10 MG tablet Take 10 mg by mouth daily.     Magnesium Bisglycinate (MAG GLYCINATE) 100 MG TABS Take 100 mg by mouth 2 (two) times daily. 60 tablet 0   metFORMIN (GLUCOPHAGE-XR) 500 MG 24 hr tablet Take 1 tablet (500 mg total) by mouth daily with breakfast. 90 tablet 1   Multiple Vitamins-Minerals (HAIR/SKIN/NAILS/BIOTIN PO) Take 1  capsule by mouth 3 (three) times daily.     omeprazole (PRILOSEC) 40 MG capsule Take 1 capsule (40 mg total) by mouth daily. 90 capsule 1   pravastatin (PRAVACHOL) 20 MG tablet TAKE 1 TABLET BY MOUTH  DAILY 90 tablet 1   promethazine (PHENERGAN) 25 MG tablet TAKE 1 TABLET BY MOUTH TWICE DAILY BEFORE MEALS AS NEEDED 60 tablet 0   tiZANidine (ZANAFLEX) 4 MG tablet Take 1 tablet (4 mg total) by mouth every 8 (eight) hours as needed for muscle spasms. 270 tablet 1   traZODone (DESYREL) 100 MG tablet Take 1 tablet (100 mg total) by mouth at bedtime. For sleep. 90 tablet 1   allopurinol (ZYLOPRIM) 100 MG tablet TAKE 1 TABLET(100 MG) BY MOUTH DAILY as needed (Patient not taking: Reported on 04/06/2019) 90 tablet 1   Menaquinone-7 (VITAMIN K2) 100 MCG CAPS Take 1 tablet by mouth daily.      Misc Natural Products (OSTEO BI-FLEX ADV TRIPLE ST PO) Take 2 tablets by mouth daily.     NON FORMULARY      Omega 3-6-9 Fatty Acids (OMEGA 3-6-9 COMPLEX) CAPS Take by mouth.     No current facility-administered medications for this visit.     Allergies as of 04/06/2019 - Review Complete 04/06/2019  Allergen Reaction Noted   Oxycodone-acetaminophen Anaphylaxis 09/01/2011   Celebrex [celecoxib] Diarrhea 01/04/2019   Codeine  09/05/2017   Benadryl [diphenhydramine] Palpitations 04/02/2016   Morphine Itching and Rash 04/02/2016   Ondansetron Diarrhea 09/01/2011   Tylenol [acetaminophen] Itching and Rash 04/02/2016    Review of Systems:    All systems reviewed and negative except where noted in HPI.  General Appearance:    Alert, cooperative, no distress, appears stated age  Head:    Normocephalic, without obvious abnormality, atraumatic  Eyes:    PERRL, conjunctiva/corneas clear,  Ears:    Grossly normal hearing    Neurologic:  Grossly normal    Observations/Objective:  Labs: CMP     Component Value Date/Time   NA 136 04/05/2019 0922   NA 141 04/19/2016 1604   K 4.0 04/05/2019 0922     CL 104 04/05/2019 0922   CO2 21 (L) 04/05/2019 0922   GLUCOSE 143 (H) 04/05/2019 0922   BUN 31 (H) 04/05/2019 0922   BUN 22 04/19/2016 1604   CREATININE 1.62 (H) 04/05/2019 0922   CALCIUM 8.8 (L) 04/05/2019 0922   PROT 7.1 04/05/2019 0922   ALBUMIN 4.2 04/05/2019 0922   AST 36 04/05/2019 0922   ALT 37 04/05/2019 0922   ALKPHOS 86 04/05/2019 0922   BILITOT 0.4 04/05/2019 0922   GFRNONAA 34 (L) 04/05/2019 0922   GFRAA 39 (L) 04/05/2019 0922   Lab Results  Component Value Date   WBC 5.3 04/05/2019   HGB 10.9 (L) 04/05/2019   HCT 31.8 (L) 04/05/2019   MCV 85.9  04/05/2019   PLT 114 (L) 04/05/2019    Imaging Studies: No results found.  Assessment and Plan:   Sarah [name] is a 62 y.o. y/o female is here today to follow-up for constipation and chronic nausea.  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  She has tried and failed multiple agents in the past and was commenced on Amitiza on 03/23/2019 at a low dose.   Plan 1.  Stop Amitiza as it caused her to get severe diarrhea 2.  She has not tried lactulose in the past and I suggested it would be something that is easier to titrate according to her needs.  We should probably start with 10 cc once a day and uptitrated to the desired consistency that she requires.  Follow-up in 3 to 4 weeks telephone visit.     I discussed the assessment and treatment plan with the patient. The patient was provided an opportunity to ask questions and all were answered. The patient agreed with the plan and demonstrated an understanding of the instructions.   The patient was advised to call back or seek an in-person evaluation if the symptoms worsen or if the condition fails to improve as anticipated.   Dr Sarah Bellows MD,MRCP [facility]) Gastroenterology/Hepatology Pager: [phone]   Speech recognition software was used to dictate this note.

## 2019-04-12 ENCOUNTER — Other Ambulatory Visit: Payer: Self-pay

## 2019-04-12 ENCOUNTER — Inpatient Hospital Stay: Payer: Medicare PPO

## 2019-04-12 VITALS — BP 105/66 | HR 67 | Temp 97.3°F | Resp 18

## 2019-04-12 DIAGNOSIS — G8929 Other chronic pain: Secondary | ICD-10-CM | POA: Diagnosis not present

## 2019-04-12 DIAGNOSIS — C9001 Multiple myeloma in remission: Secondary | ICD-10-CM

## 2019-04-12 DIAGNOSIS — C9000 Multiple myeloma not having achieved remission: Secondary | ICD-10-CM | POA: Diagnosis not present

## 2019-04-12 DIAGNOSIS — R110 Nausea: Secondary | ICD-10-CM | POA: Diagnosis not present

## 2019-04-12 DIAGNOSIS — R197 Diarrhea, unspecified: Secondary | ICD-10-CM | POA: Diagnosis not present

## 2019-04-12 DIAGNOSIS — R634 Abnormal weight loss: Secondary | ICD-10-CM | POA: Diagnosis not present

## 2019-04-12 DIAGNOSIS — Z23 Encounter for immunization: Secondary | ICD-10-CM | POA: Diagnosis not present

## 2019-04-12 DIAGNOSIS — R252 Cramp and spasm: Secondary | ICD-10-CM | POA: Diagnosis not present

## 2019-04-12 DIAGNOSIS — I130 Hypertensive heart and chronic kidney disease with heart failure and stage 1 through stage 4 chronic kidney disease, or unspecified chronic kidney disease: Secondary | ICD-10-CM | POA: Diagnosis not present

## 2019-04-12 DIAGNOSIS — E1140 Type 2 diabetes mellitus with diabetic neuropathy, unspecified: Secondary | ICD-10-CM | POA: Diagnosis not present

## 2019-04-12 LAB — MAGNESIUM: Magnesium: 1.1 mg/dL — ABNORMAL LOW (ref 1.7–2.4)

## 2019-04-12 MED ORDER — SODIUM CHLORIDE 0.9% FLUSH
10.0000 mL | INTRAVENOUS | Status: DC | PRN
  Administered 2019-04-12: 10 mL via INTRAVENOUS
  Filled 2019-04-12: qty 10

## 2019-04-12 MED ORDER — INFLUENZA VAC SPLIT QUAD 0.5 ML IM SUSY
0.5000 mL | PREFILLED_SYRINGE | Freq: Once | INTRAMUSCULAR | Status: AC
  Administered 2019-04-12: 12:00:00 0.5 mL via INTRAMUSCULAR

## 2019-04-12 MED ORDER — SODIUM CHLORIDE 0.9 % IV SOLN
Freq: Once | INTRAVENOUS | Status: AC
  Administered 2019-04-12: 10:00:00 via INTRAVENOUS
  Filled 2019-04-12: qty 250

## 2019-04-12 MED ORDER — MAGNESIUM SULFATE 4 GM/100ML IV SOLN
4.0000 g | Freq: Once | INTRAVENOUS | Status: AC
  Administered 2019-04-12: 4 g via INTRAVENOUS

## 2019-04-12 MED ORDER — HEPARIN SOD (PORK) LOCK FLUSH 100 UNIT/ML IV SOLN
500.0000 [IU] | Freq: Once | INTRAVENOUS | Status: AC
  Administered 2019-04-12: 12:00:00 500 [IU] via INTRAVENOUS

## 2019-04-12 NOTE — Patient Instructions (Signed)
Magnesium Sulfate injection What is this medicine? MAGNESIUM SULFATE (mag NEE zee um SUL fate) is an electrolyte injection commonly used to treat low magnesium levels in your blood. It is also used to prevent or control seizures in women with preeclampsia or eclampsia. This medicine may be used for other purposes; ask your health care provider or pharmacist if you have questions. What should I tell my health care provider before I take this medicine? They need to know if you have any of these conditions:  heart disease  history of irregular heart beat  kidney disease  an unusual or allergic reaction to magnesium sulfate, medicines, foods, dyes, or preservatives  pregnant or trying to get pregnant  breast-feeding How should I use this medicine? This medicine is for infusion into a vein. It is given by a health care professional in a hospital or clinic setting. Talk to your pediatrician regarding the use of this medicine in children. While this drug may be prescribed for selected conditions, precautions do apply. Overdosage: If you think you have taken too much of this medicine contact a poison control center or emergency room at once. NOTE: This medicine is only for you. Do not share this medicine with others. What if I miss a dose? This does not apply. What may interact with this medicine? This medicine may interact with the following medications:  certain medicines for anxiety or sleep  certain medicines for seizures like phenobarbital  digoxin  medicines that relax muscles for surgery  narcotic medicines for pain This list may not describe all possible interactions. Give your health care provider a list of all the medicines, herbs, non-prescription drugs, or dietary supplements you use. Also tell them if you smoke, drink alcohol, or use illegal drugs. Some items may interact with your medicine. What should I watch for while using this medicine? Your condition will be  monitored carefully while you are receiving this medicine. You may need blood work done while you are receiving this medicine. What side effects may I notice from receiving this medicine? Side effects that you should report to your doctor or health care professional as soon as possible:  allergic reactions like skin rash, itching or hives, swelling of the face, lips, or tongue  facial flushing  muscle weakness  signs and symptoms of low blood pressure like dizziness; feeling faint or lightheaded, falls; unusually weak or tired  signs and symptoms of a dangerous change in heartbeat or heart rhythm like chest pain; dizziness; fast or irregular heartbeat; palpitations; breathing problems  sweating This list may not describe all possible side effects. Call your doctor for medical advice about side effects. You may report side effects to FDA at 1-800-FDA-1088. Where should I keep my medicine? This drug is given in a hospital or clinic and will not be stored at home. NOTE: This sheet is a summary. It may not cover all possible information. If you have questions about this medicine, talk to your doctor, pharmacist, or health care provider.  2020 Elsevier/Gold Standard (2016-01-03 12:31:42) Influenza Virus Vaccine (Flucelvax) What is this medicine? INFLUENZA VIRUS VACCINE (in floo EN zuh VAHY ruhs vak SEEN) helps to reduce the risk of getting influenza also known as the flu. The vaccine only helps protect you against some strains of the flu. This medicine may be used for other purposes; ask your health care provider or pharmacist if you have questions. COMMON BRAND NAME(S): FLUCELVAX What should I tell my health care provider before I take this medicine?  They need to know if you have any of these conditions:  bleeding disorder like hemophilia  fever or infection  Guillain-Barre syndrome or other neurological problems  immune system problems  infection with the human immunodeficiency  virus (HIV) or AIDS  low blood platelet counts  multiple sclerosis  an unusual or allergic reaction to influenza virus vaccine, other medicines, foods, dyes or preservatives  pregnant or trying to get pregnant  breast-feeding How should I use this medicine? This vaccine is for injection into a muscle. It is given by a health care professional. A copy of Vaccine Information Statements will be given before each vaccination. Read this sheet carefully each time. The sheet may change frequently. Talk to your pediatrician regarding the use of this medicine in children. Special care may be needed. Overdosage: If you think you've taken too much of this medicine contact a poison control center or emergency room at once. Overdosage: If you think you have taken too much of this medicine contact a poison control center or emergency room at once. NOTE: This medicine is only for you. Do not share this medicine with others. What if I miss a dose? This does not apply. What may interact with this medicine?  chemotherapy or radiation therapy  medicines that lower your immune system like etanercept, anakinra, infliximab, and adalimumab  medicines that treat or prevent blood clots like warfarin  phenytoin  steroid medicines like prednisone or cortisone  theophylline  vaccines This list may not describe all possible interactions. Give your health care provider a list of all the medicines, herbs, non-prescription drugs, or dietary supplements you use. Also tell them if you smoke, drink alcohol, or use illegal drugs. Some items may interact with your medicine. What should I watch for while using this medicine? Report any side effects that do not go away within 3 days to your doctor or health care professional. Call your health care provider if any unusual symptoms occur within 6 weeks of receiving this vaccine. You may still catch the flu, but the illness is not usually as bad. You cannot get the flu  from the vaccine. The vaccine will not protect against colds or other illnesses that may cause fever. The vaccine is needed every year. What side effects may I notice from receiving this medicine? Side effects that you should report to your doctor or health care professional as soon as possible:  allergic reactions like skin rash, itching or hives, swelling of the face, lips, or tongue Side effects that usually do not require medical attention (Report these to your doctor or health care professional if they continue or are bothersome.):  fever  headache  muscle aches and pains  pain, tenderness, redness, or swelling at the injection site  tiredness This list may not describe all possible side effects. Call your doctor for medical advice about side effects. You may report side effects to FDA at 1-800-FDA-1088. Where should I keep my medicine? The vaccine will be given by a health care professional in a clinic, pharmacy, doctor's office, or other health care setting. You will not be given vaccine doses to store at home. NOTE: This sheet is a summary. It may not cover all possible information. If you have questions about this medicine, talk to your doctor, pharmacist, or health care provider.  2020 Elsevier/Gold Standard (2011-05-29 14:06:47)

## 2019-04-14 ENCOUNTER — Other Ambulatory Visit: Payer: Self-pay

## 2019-04-15 ENCOUNTER — Inpatient Hospital Stay: Payer: Medicare PPO

## 2019-04-15 DIAGNOSIS — C9000 Multiple myeloma not having achieved remission: Secondary | ICD-10-CM | POA: Diagnosis not present

## 2019-04-15 DIAGNOSIS — G8929 Other chronic pain: Secondary | ICD-10-CM | POA: Diagnosis not present

## 2019-04-15 DIAGNOSIS — C9001 Multiple myeloma in remission: Secondary | ICD-10-CM

## 2019-04-15 DIAGNOSIS — R110 Nausea: Secondary | ICD-10-CM | POA: Diagnosis not present

## 2019-04-15 DIAGNOSIS — Z23 Encounter for immunization: Secondary | ICD-10-CM | POA: Diagnosis not present

## 2019-04-15 DIAGNOSIS — I130 Hypertensive heart and chronic kidney disease with heart failure and stage 1 through stage 4 chronic kidney disease, or unspecified chronic kidney disease: Secondary | ICD-10-CM | POA: Diagnosis not present

## 2019-04-15 DIAGNOSIS — E1140 Type 2 diabetes mellitus with diabetic neuropathy, unspecified: Secondary | ICD-10-CM | POA: Diagnosis not present

## 2019-04-15 DIAGNOSIS — R634 Abnormal weight loss: Secondary | ICD-10-CM | POA: Diagnosis not present

## 2019-04-15 DIAGNOSIS — R197 Diarrhea, unspecified: Secondary | ICD-10-CM | POA: Diagnosis not present

## 2019-04-15 DIAGNOSIS — R252 Cramp and spasm: Secondary | ICD-10-CM | POA: Diagnosis not present

## 2019-04-15 LAB — MAGNESIUM: Magnesium: 1.4 mg/dL — ABNORMAL LOW (ref 1.7–2.4)

## 2019-04-15 MED ORDER — HEPARIN SOD (PORK) LOCK FLUSH 100 UNIT/ML IV SOLN
500.0000 [IU] | Freq: Once | INTRAVENOUS | Status: AC
  Administered 2019-04-15: 500 [IU] via INTRAVENOUS
  Filled 2019-04-15: qty 5

## 2019-04-15 MED ORDER — SODIUM CHLORIDE 0.9% FLUSH
10.0000 mL | INTRAVENOUS | Status: DC | PRN
  Administered 2019-04-15: 09:00:00 10 mL via INTRAVENOUS
  Filled 2019-04-15: qty 10

## 2019-04-15 MED ORDER — MAGNESIUM SULFATE 4 GM/100ML IV SOLN
4.0000 g | Freq: Once | INTRAVENOUS | Status: AC
  Administered 2019-04-15: 4 g via INTRAVENOUS
  Filled 2019-04-15: qty 100

## 2019-04-15 MED ORDER — SODIUM CHLORIDE 0.9 % IV SOLN
Freq: Once | INTRAVENOUS | Status: AC
  Administered 2019-04-15: 10:00:00 via INTRAVENOUS
  Filled 2019-04-15: qty 250

## 2019-04-16 ENCOUNTER — Other Ambulatory Visit: Payer: Self-pay

## 2019-04-16 DIAGNOSIS — I350 Nonrheumatic aortic (valve) stenosis: Secondary | ICD-10-CM | POA: Diagnosis not present

## 2019-04-19 ENCOUNTER — Ambulatory Visit: Payer: Medicare PPO

## 2019-04-19 ENCOUNTER — Other Ambulatory Visit: Payer: Self-pay

## 2019-04-19 ENCOUNTER — Inpatient Hospital Stay: Payer: Medicare PPO

## 2019-04-19 DIAGNOSIS — G8929 Other chronic pain: Secondary | ICD-10-CM | POA: Diagnosis not present

## 2019-04-19 DIAGNOSIS — Z23 Encounter for immunization: Secondary | ICD-10-CM | POA: Diagnosis not present

## 2019-04-19 DIAGNOSIS — I130 Hypertensive heart and chronic kidney disease with heart failure and stage 1 through stage 4 chronic kidney disease, or unspecified chronic kidney disease: Secondary | ICD-10-CM | POA: Diagnosis not present

## 2019-04-19 DIAGNOSIS — R252 Cramp and spasm: Secondary | ICD-10-CM | POA: Diagnosis not present

## 2019-04-19 DIAGNOSIS — E1140 Type 2 diabetes mellitus with diabetic neuropathy, unspecified: Secondary | ICD-10-CM | POA: Diagnosis not present

## 2019-04-19 DIAGNOSIS — C9000 Multiple myeloma not having achieved remission: Secondary | ICD-10-CM | POA: Diagnosis not present

## 2019-04-19 DIAGNOSIS — R110 Nausea: Secondary | ICD-10-CM | POA: Diagnosis not present

## 2019-04-19 DIAGNOSIS — R197 Diarrhea, unspecified: Secondary | ICD-10-CM | POA: Diagnosis not present

## 2019-04-19 DIAGNOSIS — R634 Abnormal weight loss: Secondary | ICD-10-CM | POA: Diagnosis not present

## 2019-04-19 DIAGNOSIS — C9001 Multiple myeloma in remission: Secondary | ICD-10-CM

## 2019-04-19 LAB — MAGNESIUM: Magnesium: 1.2 mg/dL — ABNORMAL LOW (ref 1.7–2.4)

## 2019-04-19 MED ORDER — CYANOCOBALAMIN 1000 MCG/ML IJ SOLN
1000.0000 ug | Freq: Once | INTRAMUSCULAR | Status: AC
  Administered 2019-04-19: 1000 ug via INTRAMUSCULAR
  Filled 2019-04-19: qty 1

## 2019-04-19 MED ORDER — SODIUM CHLORIDE 0.9 % IV SOLN
Freq: Once | INTRAVENOUS | Status: AC
  Administered 2019-04-19: 09:00:00 via INTRAVENOUS
  Filled 2019-04-19: qty 250

## 2019-04-19 MED ORDER — HEPARIN SOD (PORK) LOCK FLUSH 100 UNIT/ML IV SOLN
500.0000 [IU] | Freq: Once | INTRAVENOUS | Status: AC
  Administered 2019-04-19: 500 [IU] via INTRAVENOUS
  Filled 2019-04-19: qty 5

## 2019-04-19 MED ORDER — MAGNESIUM SULFATE 4 GM/100ML IV SOLN
4.0000 g | Freq: Once | INTRAVENOUS | Status: AC
  Administered 2019-04-19: 4 g via INTRAVENOUS
  Filled 2019-04-19: qty 100

## 2019-04-19 MED ORDER — SODIUM CHLORIDE 0.9% FLUSH
10.0000 mL | INTRAVENOUS | Status: DC | PRN
  Administered 2019-04-19: 10 mL via INTRAVENOUS
  Filled 2019-04-19: qty 10

## 2019-04-19 NOTE — Patient Instructions (Addendum)
Magnesium Sulfate injection What is this medicine? MAGNESIUM SULFATE (mag NEE zee um SUL fate) is an electrolyte injection commonly used to treat low magnesium levels in your blood. It is also used to prevent or control seizures in women with preeclampsia or eclampsia. This medicine may be used for other purposes; ask your health care provider or pharmacist if you have questions. What should I tell my health care provider before I take this medicine? They need to know if you have any of these conditions:  heart disease  history of irregular heart beat  kidney disease  an unusual or allergic reaction to magnesium sulfate, medicines, foods, dyes, or preservatives  pregnant or trying to get pregnant  breast-feeding How should I use this medicine? This medicine is for infusion into a vein. It is given by a health care professional in a hospital or clinic setting. Talk to your pediatrician regarding the use of this medicine in children. While this drug may be prescribed for selected conditions, precautions do apply. Overdosage: If you think you have taken too much of this medicine contact a poison control center or emergency room at once. NOTE: This medicine is only for you. Do not share this medicine with others. What if I miss a dose? This does not apply. What may interact with this medicine? This medicine may interact with the following medications:  certain medicines for anxiety or sleep  certain medicines for seizures like phenobarbital  digoxin  medicines that relax muscles for surgery  narcotic medicines for pain This list may not describe all possible interactions. Give your health care provider a list of all the medicines, herbs, non-prescription drugs, or dietary supplements you use. Also tell them if you smoke, drink alcohol, or use illegal drugs. Some items may interact with your medicine. What should I watch for while using this medicine? Your condition will be  monitored carefully while you are receiving this medicine. You may need blood work done while you are receiving this medicine. What side effects may I notice from receiving this medicine? Side effects that you should report to your doctor or health care professional as soon as possible:  allergic reactions like skin rash, itching or hives, swelling of the face, lips, or tongue  facial flushing  muscle weakness  signs and symptoms of low blood pressure like dizziness; feeling faint or lightheaded, falls; unusually weak or tired  signs and symptoms of a dangerous change in heartbeat or heart rhythm like chest pain; dizziness; fast or irregular heartbeat; palpitations; breathing problems  sweating This list may not describe all possible side effects. Call your doctor for medical advice about side effects. You may report side effects to FDA at 1-800-FDA-1088. Where should I keep my medicine? This drug is given in a hospital or clinic and will not be stored at home. Cyanocobalamin, Vitamin B12 injection What is this medicine? CYANOCOBALAMIN (sye an oh koe BAL a min) is a man made form of vitamin B12. Vitamin B12 is used in the growth of healthy blood cells, nerve cells, and proteins in the body. It also helps with the metabolism of fats and carbohydrates. This medicine is used to treat people who can not absorb vitamin B12. This medicine may be used for other purposes; ask your health care provider or pharmacist if you have questions. COMMON BRAND NAME(S): B-12 Compliance Kit, B-12 Injection Kit, Cyomin, LA-12, Nutri-Twelve, Physicians EZ Use B-12, Primabalt What should I tell my health care provider before I take this medicine? They  need to know if you have any of these conditions:  kidney disease  Leber's disease  megaloblastic anemia  an unusual or allergic reaction to cyanocobalamin, cobalt, other medicines, foods, dyes, or preservatives  pregnant or trying to get  pregnant  breast-feeding How should I use this medicine? This medicine is injected into a muscle or deeply under the skin. It is usually given by a health care professional in a clinic or doctor's office. However, your doctor may teach you how to inject yourself. Follow all instructions. Talk to your pediatrician regarding the use of this medicine in children. Special care may be needed. Overdosage: If you think you have taken too much of this medicine contact a poison control center or emergency room at once. NOTE: This medicine is only for you. Do not share this medicine with others. What if I miss a dose? If you are given your dose at a clinic or doctor's office, call to reschedule your appointment. If you give your own injections and you miss a dose, take it as soon as you can. If it is almost time for your next dose, take only that dose. Do not take double or extra doses. What may interact with this medicine?  colchicine  heavy alcohol intake This list may not describe all possible interactions. Give your health care provider a list of all the medicines, herbs, non-prescription drugs, or dietary supplements you use. Also tell them if you smoke, drink alcohol, or use illegal drugs. Some items may interact with your medicine. What should I watch for while using this medicine? Visit your doctor or health care professional regularly. You may need blood work done while you are taking this medicine. You may need to follow a special diet. Talk to your doctor. Limit your alcohol intake and avoid smoking to get the best benefit. What side effects may I notice from receiving this medicine? Side effects that you should report to your doctor or health care professional as soon as possible:  allergic reactions like skin rash, itching or hives, swelling of the face, lips, or tongue  blue tint to skin  chest tightness, pain  difficulty breathing, wheezing  dizziness  red, swollen painful area  on the leg Side effects that usually do not require medical attention (report to your doctor or health care professional if they continue or are bothersome):  diarrhea  headache This list may not describe all possible side effects. Call your doctor for medical advice about side effects. You may report side effects to FDA at 1-800-FDA-1088. Where should I keep my medicine? Keep out of the reach of children. Store at room temperature between 15 and 30 degrees C (59 and 85 degrees F). Protect from light. Throw away any unused medicine after the expiration date. NOTE: This sheet is a summary. It may not cover all possible information. If you have questions about this medicine, talk to your doctor, pharmacist, or health care provider.  2020 Elsevier/Gold Standard (2007-09-28 22:10:20) NOTE: This sheet is a summary. It may not cover all possible information. If you have questions about this medicine, talk to your doctor, pharmacist, or health care provider.  2020 Elsevier/Gold Standard (2016-01-03 12:31:42)

## 2019-04-20 ENCOUNTER — Encounter: Payer: Self-pay | Admitting: Hematology and Oncology

## 2019-04-20 DIAGNOSIS — Q231 Congenital insufficiency of aortic valve: Secondary | ICD-10-CM | POA: Diagnosis not present

## 2019-04-20 DIAGNOSIS — I110 Hypertensive heart disease with heart failure: Secondary | ICD-10-CM | POA: Diagnosis not present

## 2019-04-20 DIAGNOSIS — I10 Essential (primary) hypertension: Secondary | ICD-10-CM | POA: Diagnosis not present

## 2019-04-20 DIAGNOSIS — I350 Nonrheumatic aortic (valve) stenosis: Secondary | ICD-10-CM | POA: Diagnosis not present

## 2019-04-21 DIAGNOSIS — M47816 Spondylosis without myelopathy or radiculopathy, lumbar region: Secondary | ICD-10-CM | POA: Diagnosis not present

## 2019-04-21 DIAGNOSIS — M7062 Trochanteric bursitis, left hip: Secondary | ICD-10-CM | POA: Diagnosis not present

## 2019-04-21 DIAGNOSIS — Z79891 Long term (current) use of opiate analgesic: Secondary | ICD-10-CM | POA: Diagnosis not present

## 2019-04-21 DIAGNOSIS — M533 Sacrococcygeal disorders, not elsewhere classified: Secondary | ICD-10-CM | POA: Diagnosis not present

## 2019-04-21 DIAGNOSIS — M47817 Spondylosis without myelopathy or radiculopathy, lumbosacral region: Secondary | ICD-10-CM | POA: Diagnosis not present

## 2019-04-26 ENCOUNTER — Other Ambulatory Visit: Payer: Self-pay | Admitting: Hematology and Oncology

## 2019-04-26 ENCOUNTER — Inpatient Hospital Stay: Payer: Medicare PPO

## 2019-04-26 ENCOUNTER — Other Ambulatory Visit: Payer: Self-pay

## 2019-04-26 DIAGNOSIS — I130 Hypertensive heart and chronic kidney disease with heart failure and stage 1 through stage 4 chronic kidney disease, or unspecified chronic kidney disease: Secondary | ICD-10-CM | POA: Diagnosis not present

## 2019-04-26 DIAGNOSIS — G8929 Other chronic pain: Secondary | ICD-10-CM | POA: Diagnosis not present

## 2019-04-26 DIAGNOSIS — R197 Diarrhea, unspecified: Secondary | ICD-10-CM | POA: Diagnosis not present

## 2019-04-26 DIAGNOSIS — E1140 Type 2 diabetes mellitus with diabetic neuropathy, unspecified: Secondary | ICD-10-CM | POA: Diagnosis not present

## 2019-04-26 DIAGNOSIS — C9000 Multiple myeloma not having achieved remission: Secondary | ICD-10-CM | POA: Diagnosis not present

## 2019-04-26 DIAGNOSIS — R252 Cramp and spasm: Secondary | ICD-10-CM | POA: Diagnosis not present

## 2019-04-26 DIAGNOSIS — Z23 Encounter for immunization: Secondary | ICD-10-CM | POA: Diagnosis not present

## 2019-04-26 DIAGNOSIS — R110 Nausea: Secondary | ICD-10-CM | POA: Diagnosis not present

## 2019-04-26 DIAGNOSIS — C9001 Multiple myeloma in remission: Secondary | ICD-10-CM

## 2019-04-26 DIAGNOSIS — R634 Abnormal weight loss: Secondary | ICD-10-CM | POA: Diagnosis not present

## 2019-04-26 LAB — MAGNESIUM: Magnesium: 1.1 mg/dL — ABNORMAL LOW (ref 1.7–2.4)

## 2019-04-26 MED ORDER — SODIUM CHLORIDE 0.9% FLUSH
10.0000 mL | INTRAVENOUS | Status: DC | PRN
  Administered 2019-04-26: 10 mL via INTRAVENOUS
  Filled 2019-04-26: qty 10

## 2019-04-26 MED ORDER — SODIUM CHLORIDE 0.9 % IV SOLN
Freq: Once | INTRAVENOUS | Status: AC
  Administered 2019-04-26: 10:00:00 via INTRAVENOUS
  Filled 2019-04-26: qty 250

## 2019-04-26 MED ORDER — MAGNESIUM SULFATE 4 GM/100ML IV SOLN
4.0000 g | Freq: Once | INTRAVENOUS | Status: AC
  Administered 2019-04-26: 4 g via INTRAVENOUS
  Filled 2019-04-26: qty 100

## 2019-04-26 MED ORDER — HEPARIN SOD (PORK) LOCK FLUSH 100 UNIT/ML IV SOLN
500.0000 [IU] | Freq: Once | INTRAVENOUS | Status: AC
  Administered 2019-04-26: 500 [IU] via INTRAVENOUS
  Filled 2019-04-26: qty 5

## 2019-04-28 ENCOUNTER — Other Ambulatory Visit: Payer: Self-pay | Admitting: Hematology and Oncology

## 2019-04-28 ENCOUNTER — Other Ambulatory Visit: Payer: Self-pay | Admitting: Internal Medicine

## 2019-04-28 DIAGNOSIS — F5101 Primary insomnia: Secondary | ICD-10-CM

## 2019-04-28 DIAGNOSIS — C9001 Multiple myeloma in remission: Secondary | ICD-10-CM

## 2019-04-28 DIAGNOSIS — R110 Nausea: Secondary | ICD-10-CM

## 2019-05-03 ENCOUNTER — Other Ambulatory Visit: Payer: Self-pay

## 2019-05-03 ENCOUNTER — Inpatient Hospital Stay: Payer: Medicare PPO

## 2019-05-03 ENCOUNTER — Inpatient Hospital Stay: Payer: Medicare PPO | Attending: Hematology and Oncology

## 2019-05-03 DIAGNOSIS — C9001 Multiple myeloma in remission: Secondary | ICD-10-CM

## 2019-05-03 DIAGNOSIS — E1122 Type 2 diabetes mellitus with diabetic chronic kidney disease: Secondary | ICD-10-CM | POA: Insufficient documentation

## 2019-05-03 DIAGNOSIS — R208 Other disturbances of skin sensation: Secondary | ICD-10-CM | POA: Diagnosis not present

## 2019-05-03 DIAGNOSIS — Z79899 Other long term (current) drug therapy: Secondary | ICD-10-CM | POA: Insufficient documentation

## 2019-05-03 DIAGNOSIS — N183 Chronic kidney disease, stage 3 unspecified: Secondary | ICD-10-CM | POA: Diagnosis not present

## 2019-05-03 DIAGNOSIS — M549 Dorsalgia, unspecified: Secondary | ICD-10-CM | POA: Insufficient documentation

## 2019-05-03 DIAGNOSIS — R197 Diarrhea, unspecified: Secondary | ICD-10-CM | POA: Insufficient documentation

## 2019-05-03 DIAGNOSIS — G629 Polyneuropathy, unspecified: Secondary | ICD-10-CM | POA: Insufficient documentation

## 2019-05-03 DIAGNOSIS — D631 Anemia in chronic kidney disease: Secondary | ICD-10-CM | POA: Diagnosis not present

## 2019-05-03 DIAGNOSIS — M879 Osteonecrosis, unspecified: Secondary | ICD-10-CM | POA: Insufficient documentation

## 2019-05-03 DIAGNOSIS — I129 Hypertensive chronic kidney disease with stage 1 through stage 4 chronic kidney disease, or unspecified chronic kidney disease: Secondary | ICD-10-CM | POA: Diagnosis not present

## 2019-05-03 DIAGNOSIS — G8929 Other chronic pain: Secondary | ICD-10-CM | POA: Diagnosis not present

## 2019-05-03 DIAGNOSIS — K589 Irritable bowel syndrome without diarrhea: Secondary | ICD-10-CM | POA: Diagnosis not present

## 2019-05-03 DIAGNOSIS — R110 Nausea: Secondary | ICD-10-CM | POA: Insufficient documentation

## 2019-05-03 DIAGNOSIS — C9000 Multiple myeloma not having achieved remission: Secondary | ICD-10-CM | POA: Diagnosis not present

## 2019-05-03 DIAGNOSIS — R12 Heartburn: Secondary | ICD-10-CM | POA: Insufficient documentation

## 2019-05-03 LAB — MAGNESIUM: Magnesium: 1.2 mg/dL — ABNORMAL LOW (ref 1.7–2.4)

## 2019-05-03 MED ORDER — HEPARIN SOD (PORK) LOCK FLUSH 100 UNIT/ML IV SOLN
500.0000 [IU] | Freq: Once | INTRAVENOUS | Status: AC
  Administered 2019-05-03: 13:00:00 500 [IU] via INTRAVENOUS
  Filled 2019-05-03: qty 5

## 2019-05-03 MED ORDER — SODIUM CHLORIDE 0.9 % IV SOLN
Freq: Once | INTRAVENOUS | Status: AC
  Administered 2019-05-03: 10:00:00 via INTRAVENOUS
  Filled 2019-05-03: qty 250

## 2019-05-03 MED ORDER — SODIUM CHLORIDE 0.9% FLUSH
10.0000 mL | INTRAVENOUS | Status: DC | PRN
  Administered 2019-05-03: 10 mL via INTRAVENOUS
  Filled 2019-05-03: qty 10

## 2019-05-03 MED ORDER — MAGNESIUM SULFATE 4 GM/100ML IV SOLN
4.0000 g | Freq: Once | INTRAVENOUS | Status: AC
  Administered 2019-05-03: 4 g via INTRAVENOUS
  Filled 2019-05-03: qty 100

## 2019-05-10 ENCOUNTER — Inpatient Hospital Stay: Payer: Medicare PPO

## 2019-05-10 ENCOUNTER — Inpatient Hospital Stay: Payer: Medicare PPO | Attending: Hematology and Oncology

## 2019-05-10 ENCOUNTER — Other Ambulatory Visit: Payer: Self-pay

## 2019-05-10 DIAGNOSIS — R200 Anesthesia of skin: Secondary | ICD-10-CM | POA: Insufficient documentation

## 2019-05-10 DIAGNOSIS — R509 Fever, unspecified: Secondary | ICD-10-CM | POA: Diagnosis not present

## 2019-05-10 DIAGNOSIS — Z801 Family history of malignant neoplasm of trachea, bronchus and lung: Secondary | ICD-10-CM | POA: Insufficient documentation

## 2019-05-10 DIAGNOSIS — G8929 Other chronic pain: Secondary | ICD-10-CM | POA: Insufficient documentation

## 2019-05-10 DIAGNOSIS — R12 Heartburn: Secondary | ICD-10-CM | POA: Diagnosis not present

## 2019-05-10 DIAGNOSIS — R208 Other disturbances of skin sensation: Secondary | ICD-10-CM | POA: Insufficient documentation

## 2019-05-10 DIAGNOSIS — R110 Nausea: Secondary | ICD-10-CM | POA: Insufficient documentation

## 2019-05-10 DIAGNOSIS — N183 Chronic kidney disease, stage 3 unspecified: Secondary | ICD-10-CM | POA: Insufficient documentation

## 2019-05-10 DIAGNOSIS — Z8249 Family history of ischemic heart disease and other diseases of the circulatory system: Secondary | ICD-10-CM | POA: Insufficient documentation

## 2019-05-10 DIAGNOSIS — Z803 Family history of malignant neoplasm of breast: Secondary | ICD-10-CM | POA: Insufficient documentation

## 2019-05-10 DIAGNOSIS — F329 Major depressive disorder, single episode, unspecified: Secondary | ICD-10-CM | POA: Diagnosis not present

## 2019-05-10 DIAGNOSIS — Z800 Family history of malignant neoplasm of digestive organs: Secondary | ICD-10-CM | POA: Insufficient documentation

## 2019-05-10 DIAGNOSIS — E119 Type 2 diabetes mellitus without complications: Secondary | ICD-10-CM | POA: Insufficient documentation

## 2019-05-10 DIAGNOSIS — G629 Polyneuropathy, unspecified: Secondary | ICD-10-CM | POA: Insufficient documentation

## 2019-05-10 DIAGNOSIS — I10 Essential (primary) hypertension: Secondary | ICD-10-CM | POA: Insufficient documentation

## 2019-05-10 DIAGNOSIS — R197 Diarrhea, unspecified: Secondary | ICD-10-CM | POA: Diagnosis not present

## 2019-05-10 DIAGNOSIS — M879 Osteonecrosis, unspecified: Secondary | ICD-10-CM | POA: Insufficient documentation

## 2019-05-10 DIAGNOSIS — D631 Anemia in chronic kidney disease: Secondary | ICD-10-CM | POA: Diagnosis not present

## 2019-05-10 DIAGNOSIS — Z822 Family history of deafness and hearing loss: Secondary | ICD-10-CM | POA: Insufficient documentation

## 2019-05-10 DIAGNOSIS — Z808 Family history of malignant neoplasm of other organs or systems: Secondary | ICD-10-CM | POA: Diagnosis not present

## 2019-05-10 DIAGNOSIS — Z79899 Other long term (current) drug therapy: Secondary | ICD-10-CM | POA: Insufficient documentation

## 2019-05-10 DIAGNOSIS — C9000 Multiple myeloma not having achieved remission: Secondary | ICD-10-CM | POA: Insufficient documentation

## 2019-05-10 DIAGNOSIS — Z833 Family history of diabetes mellitus: Secondary | ICD-10-CM | POA: Insufficient documentation

## 2019-05-10 DIAGNOSIS — Z841 Family history of disorders of kidney and ureter: Secondary | ICD-10-CM | POA: Insufficient documentation

## 2019-05-10 DIAGNOSIS — M549 Dorsalgia, unspecified: Secondary | ICD-10-CM | POA: Insufficient documentation

## 2019-05-10 DIAGNOSIS — Z8379 Family history of other diseases of the digestive system: Secondary | ICD-10-CM | POA: Insufficient documentation

## 2019-05-10 DIAGNOSIS — C9001 Multiple myeloma in remission: Secondary | ICD-10-CM

## 2019-05-10 LAB — MAGNESIUM: Magnesium: 1.1 mg/dL — ABNORMAL LOW (ref 1.7–2.4)

## 2019-05-10 MED ORDER — SODIUM CHLORIDE 0.9 % IV SOLN
Freq: Once | INTRAVENOUS | Status: AC
  Administered 2019-05-10: 10:00:00 via INTRAVENOUS
  Filled 2019-05-10: qty 250

## 2019-05-10 MED ORDER — HEPARIN SOD (PORK) LOCK FLUSH 100 UNIT/ML IV SOLN
500.0000 [IU] | Freq: Once | INTRAVENOUS | Status: AC | PRN
  Administered 2019-05-10: 500 [IU]
  Filled 2019-05-10: qty 5

## 2019-05-10 MED ORDER — MAGNESIUM SULFATE 4 GM/100ML IV SOLN
INTRAVENOUS | Status: AC
  Filled 2019-05-10: qty 100

## 2019-05-10 MED ORDER — MAGNESIUM SULFATE 4 GM/100ML IV SOLN
4.0000 g | Freq: Once | INTRAVENOUS | Status: AC
  Administered 2019-05-10: 4 g via INTRAVENOUS

## 2019-05-12 DIAGNOSIS — N183 Chronic kidney disease, stage 3 unspecified: Secondary | ICD-10-CM | POA: Insufficient documentation

## 2019-05-13 DIAGNOSIS — N3946 Mixed incontinence: Secondary | ICD-10-CM | POA: Diagnosis not present

## 2019-05-14 ENCOUNTER — Other Ambulatory Visit: Payer: Self-pay

## 2019-05-17 ENCOUNTER — Inpatient Hospital Stay: Payer: Medicare PPO

## 2019-05-17 ENCOUNTER — Other Ambulatory Visit: Payer: Self-pay

## 2019-05-17 VITALS — BP 99/64 | HR 66 | Temp 97.0°F | Resp 16

## 2019-05-17 DIAGNOSIS — R197 Diarrhea, unspecified: Secondary | ICD-10-CM | POA: Diagnosis not present

## 2019-05-17 DIAGNOSIS — R509 Fever, unspecified: Secondary | ICD-10-CM | POA: Diagnosis not present

## 2019-05-17 DIAGNOSIS — N183 Chronic kidney disease, stage 3 unspecified: Secondary | ICD-10-CM | POA: Diagnosis not present

## 2019-05-17 DIAGNOSIS — G629 Polyneuropathy, unspecified: Secondary | ICD-10-CM | POA: Diagnosis not present

## 2019-05-17 DIAGNOSIS — G8929 Other chronic pain: Secondary | ICD-10-CM | POA: Diagnosis not present

## 2019-05-17 DIAGNOSIS — C9001 Multiple myeloma in remission: Secondary | ICD-10-CM

## 2019-05-17 DIAGNOSIS — D631 Anemia in chronic kidney disease: Secondary | ICD-10-CM | POA: Diagnosis not present

## 2019-05-17 DIAGNOSIS — M549 Dorsalgia, unspecified: Secondary | ICD-10-CM | POA: Diagnosis not present

## 2019-05-17 DIAGNOSIS — Z95828 Presence of other vascular implants and grafts: Secondary | ICD-10-CM

## 2019-05-17 DIAGNOSIS — C9000 Multiple myeloma not having achieved remission: Secondary | ICD-10-CM | POA: Diagnosis not present

## 2019-05-17 LAB — MAGNESIUM: Magnesium: 1.2 mg/dL — ABNORMAL LOW (ref 1.7–2.4)

## 2019-05-17 MED ORDER — SODIUM CHLORIDE 0.9% FLUSH
10.0000 mL | INTRAVENOUS | Status: DC | PRN
  Administered 2019-05-17: 10 mL via INTRAVENOUS
  Filled 2019-05-17: qty 10

## 2019-05-17 MED ORDER — SODIUM CHLORIDE 0.9 % IV SOLN
Freq: Once | INTRAVENOUS | Status: AC
  Administered 2019-05-17: 10:00:00 via INTRAVENOUS
  Filled 2019-05-17: qty 250

## 2019-05-17 MED ORDER — HEPARIN SOD (PORK) LOCK FLUSH 100 UNIT/ML IV SOLN
500.0000 [IU] | Freq: Once | INTRAVENOUS | Status: AC
  Administered 2019-05-17: 500 [IU] via INTRAVENOUS
  Filled 2019-05-17: qty 5

## 2019-05-17 MED ORDER — MAGNESIUM SULFATE 4 GM/100ML IV SOLN
4.0000 g | Freq: Once | INTRAVENOUS | Status: AC
  Administered 2019-05-17: 4 g via INTRAVENOUS
  Filled 2019-05-17: qty 100

## 2019-05-17 NOTE — Patient Instructions (Signed)
Magnesium Sulfate injection What is this medicine? MAGNESIUM SULFATE (mag NEE zee um SUL fate) is an electrolyte injection commonly used to treat low magnesium levels in your blood. It is also used to prevent or control seizures in women with preeclampsia or eclampsia. This medicine may be used for other purposes; ask your health care provider or pharmacist if you have questions. What should I tell my health care provider before I take this medicine? They need to know if you have any of these conditions:  heart disease  history of irregular heart beat  kidney disease  an unusual or allergic reaction to magnesium sulfate, medicines, foods, dyes, or preservatives  pregnant or trying to get pregnant  breast-feeding How should I use this medicine? This medicine is for infusion into a vein. It is given by a health care professional in a hospital or clinic setting. Talk to your pediatrician regarding the use of this medicine in children. While this drug may be prescribed for selected conditions, precautions do apply. Overdosage: If you think you have taken too much of this medicine contact a poison control center or emergency room at once. NOTE: This medicine is only for you. Do not share this medicine with others. What if I miss a dose? This does not apply. What may interact with this medicine? This medicine may interact with the following medications:  certain medicines for anxiety or sleep  certain medicines for seizures like phenobarbital  digoxin  medicines that relax muscles for surgery  narcotic medicines for pain This list may not describe all possible interactions. Give your health care provider a list of all the medicines, herbs, non-prescription drugs, or dietary supplements you use. Also tell them if you smoke, drink alcohol, or use illegal drugs. Some items may interact with your medicine. What should I watch for while using this medicine? Your condition will be  monitored carefully while you are receiving this medicine. You may need blood work done while you are receiving this medicine. What side effects may I notice from receiving this medicine? Side effects that you should report to your doctor or health care professional as soon as possible:  allergic reactions like skin rash, itching or hives, swelling of the face, lips, or tongue  facial flushing  muscle weakness  signs and symptoms of low blood pressure like dizziness; feeling faint or lightheaded, falls; unusually weak or tired  signs and symptoms of a dangerous change in heartbeat or heart rhythm like chest pain; dizziness; fast or irregular heartbeat; palpitations; breathing problems  sweating This list may not describe all possible side effects. Call your doctor for medical advice about side effects. You may report side effects to FDA at 1-800-FDA-1088. Where should I keep my medicine? This drug is given in a hospital or clinic and will not be stored at home. NOTE: This sheet is a summary. It may not cover all possible information. If you have questions about this medicine, talk to your doctor, pharmacist, or health care provider.  2020 Elsevier/Gold Standard (2016-01-03 12:31:42)  

## 2019-05-19 ENCOUNTER — Ambulatory Visit (INDEPENDENT_AMBULATORY_CARE_PROVIDER_SITE_OTHER): Payer: Medicare PPO | Admitting: Gastroenterology

## 2019-05-19 DIAGNOSIS — N3944 Nocturnal enuresis: Secondary | ICD-10-CM | POA: Diagnosis not present

## 2019-05-19 DIAGNOSIS — N390 Urinary tract infection, site not specified: Secondary | ICD-10-CM | POA: Diagnosis not present

## 2019-05-19 DIAGNOSIS — K5903 Drug induced constipation: Secondary | ICD-10-CM | POA: Diagnosis not present

## 2019-05-19 DIAGNOSIS — N3946 Mixed incontinence: Secondary | ICD-10-CM | POA: Diagnosis not present

## 2019-05-19 NOTE — Progress Notes (Signed)
[name] , MD [address]  [place]  [place], [place] [address]  Main: [phone]  Fax: [phone]   Primary Care Physician: Glean Hess, MD  Virtual Visit via Video Note  I connected with patient on 05/19/19 at  8:45 AM EST by video and verified that I am speaking with the correct person using two identifiers.   I discussed the limitations, risks, security and privacy concerns of performing an evaluation and management service by video  and the availability of in person appointments. I also discussed with the patient that there may be a patient responsible charge related to this service. The patient expressed understanding and agreed to proceed.  Location of Patient: Home Location of Provider: Home Persons involved: Patient and provider only   History of Present Illness:  Drug-induced constipation   HPI: Sarah [name] is a 63 y.o. female   Summary of history :  She was last seen in my office on 04/06/2019..  I have seen her back in 2018 for history of diarrhea alternating with constipation.She has a history of multiple myeloma , constipation, has been using fentanyl patch for chronic pain.  Bowel movements are preceded by abdominal distention and bloating.  Has tried MiraLAX, fiber supplements docusate, senna which has not worked.  Denies use of any NSAIDs she is on metformin.  Complains of chronic nausea that began shortly after she eats.  She has a sensation of food sitting in her stomach for a long period of time.  We did initially try to put her on a high-fiber diet but it caused her to get significant diarrhea and it was stopped.  05/07/2017: EGD: Biopsies of stomach showed antral type mucosa with features of a healing mucosal injury. I also performed a colonoscopy on the same day which was normal.  Interval history10/11/2018-05/19/2019  Taking lactulose 30 cc once daily which was working well for the first few weeks then stopped working.    Current Outpatient Medications  Medication Sig Dispense Refill  . allopurinol (ZYLOPRIM) 100 MG tablet TAKE 1 TABLET(100 MG) BY MOUTH DAILY as needed (Patient not taking: Reported on 04/06/2019) 90 tablet 1  . aspirin 81 MG chewable tablet Chew 1 tablet (81 mg total) by mouth daily. 30 tablet 0  . bisoprolol (ZEBETA) 10 MG tablet TAKE 1 TABLET(10 MG) BY MOUTH DAILY 90 tablet 0  . diclofenac sodium (VOLTAREN) 1 % GEL Apply 2 g topically 4 (four) times daily. 100 g 1  . diphenoxylate-atropine (LOMOTIL) 2.5-0.025 MG tablet Take 2 tablets by mouth daily as needed for diarrhea or loose stools. 45 tablet 2  . fentaNYL (DURAGESIC - DOSED MCG/HR) 25 MCG/HR patch Place 25 mcg onto the skin every 3 (three) days.     . fexofenadine (ALLEGRA) 180 MG tablet TAKE 1 TABLET(180 MG) BY MOUTH DAILY 30 tablet 5  . FLUoxetine (PROZAC) 40 MG capsule Take 1 capsule (40 mg total) by mouth daily. 90 capsule 1  . fluticasone (FLONASE) 50 MCG/ACT nasal spray Place 2 sprays into both nostrils daily. 16 g 2  . furosemide (LASIX) 20 MG tablet Take 20 mg by mouth as needed.     Marland Kitchen glucose blood (ONE TOUCH ULTRA TEST) test strip     . hydrOXYzine (ATARAX/VISTARIL) 10 MG tablet TAKE 1 TABLET(10 MG) BY MOUTH THREE TIMES DAILY AS NEEDED 90 tablet 0  . lactulose (CHRONULAC) 10 GM/15ML solution Take 15 mLs (10 g total) by mouth daily after breakfast. 915 mL 1  .  lidocaine-prilocaine (EMLA) cream APPLY TOPICALLY AS NEEDED. 30 g 0  . lisinopril (PRINIVIL,ZESTRIL) 10 MG tablet Take 10 mg by mouth daily.    . Magnesium Bisglycinate (MAG GLYCINATE) 100 MG TABS Take 100 mg by mouth 2 (two) times daily. 60 tablet 0  . Menaquinone-7 (VITAMIN K2) 100 MCG CAPS Take 1 tablet by mouth daily.     . metFORMIN (GLUCOPHAGE-XR) 500 MG 24 hr tablet Take 1 tablet (500 mg total) by mouth daily with breakfast. 90 tablet 1  . Misc Natural Products (OSTEO BI-FLEX ADV TRIPLE ST PO) Take 2 tablets by mouth daily.    . Multiple Vitamins-Minerals  (HAIR/SKIN/NAILS/BIOTIN PO) Take 1 capsule by mouth 3 (three) times daily.    . NON FORMULARY     . Omega 3-6-9 Fatty Acids (OMEGA 3-6-9 COMPLEX) CAPS Take by mouth.    Marland Kitchen omeprazole (PRILOSEC) 40 MG capsule Take 1 capsule (40 mg total) by mouth daily. 90 capsule 1  . pravastatin (PRAVACHOL) 20 MG tablet TAKE 1 TABLET BY MOUTH  DAILY 90 tablet 1  . promethazine (PHENERGAN) 25 MG tablet TAKE 1 TABLET BY MOUTH TWICE DAILY BEFORE MEALS AS NEEDED 60 tablet 0  . tiZANidine (ZANAFLEX) 4 MG tablet Take 1 tablet (4 mg total) by mouth every 8 (eight) hours as needed for muscle spasms. 270 tablet 1  . traZODone (DESYREL) 100 MG tablet TAKE 1 TABLET AT BEDTIME  FOR  SLEEP 90 tablet 1   No current facility-administered medications for this visit.     Allergies as of 05/19/2019 - Review Complete 04/06/2019  Allergen Reaction Noted  . Oxycodone-acetaminophen Anaphylaxis 09/01/2011  . Celebrex [celecoxib] Diarrhea 01/04/2019  . Codeine  09/05/2017  . Benadryl [diphenhydramine] Palpitations 04/02/2016  . Morphine Itching and Rash 04/02/2016  . Ondansetron Diarrhea 09/01/2011  . Tylenol [acetaminophen] Itching and Rash 04/02/2016    Review of Systems:    All systems reviewed and negative except where noted in HPI.  General Appearance:    Alert, cooperative, no distress, appears stated age  Head:    Normocephalic, without obvious abnormality, atraumatic  Eyes:    PERRL, conjunctiva/corneas clear,  Ears:    Grossly normal hearing    Neurologic:  Grossly normal    Observations/Objective:  Labs: CMP     Component Value Date/Time   NA 136 04/05/2019 0922   NA 141 04/19/2016 1604   K 4.0 04/05/2019 0922   CL 104 04/05/2019 0922   CO2 21 (L) 04/05/2019 0922   GLUCOSE 143 (H) 04/05/2019 0922   BUN 31 (H) 04/05/2019 0922   BUN 22 04/19/2016 1604   CREATININE 1.62 (H) 04/05/2019 0922   CALCIUM 8.8 (L) 04/05/2019 0922   PROT 7.1 04/05/2019 0922   ALBUMIN 4.2 04/05/2019 0922   AST 36 04/05/2019  0922   ALT 37 04/05/2019 0922   ALKPHOS 86 04/05/2019 0922   BILITOT 0.4 04/05/2019 0922   GFRNONAA 34 (L) 04/05/2019 0922   GFRAA 39 (L) 04/05/2019 0922   Lab Results  Component Value Date   WBC 5.3 04/05/2019   HGB 10.9 (L) 04/05/2019   HCT 31.8 (L) 04/05/2019   MCV 85.9 04/05/2019   PLT 114 (L) 04/05/2019    Imaging Studies: No results found.  Assessment and Plan:   Sarah [name] is a 63 y.o. y/o female  here today to follow-up for constipation and chronic nausea.  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  She has tried and failed  multiple agents in the past and was commenced on Amitiza on 03/23/2019 at a low dose that causes severe diarrhea..   Plan 1.  Suggest to increase the dose of lactulose from 30 once a day to 30 twice or 3 times a day.  Follow-up in 1 week telephone visit to discuss how she is doing and titrate dose.   I discussed the assessment and treatment plan with the patient. The patient was provided an opportunity to ask questions and all were answered. The patient agreed with the plan and demonstrated an understanding of the instructions.   The patient was advised to call back or seek an in-person evaluation if the symptoms worsen or if the condition fails to improve as anticipated.  I provided 11 minutes of face-to-face time during this encounter.  Dr [name] MD,MRCP [facility]) Gastroenterology/Hepatology Pager: [phone]   Speech recognition software was used to dictate this note.

## 2019-05-20 DIAGNOSIS — N1831 Chronic kidney disease, stage 3a: Secondary | ICD-10-CM | POA: Diagnosis not present

## 2019-05-20 DIAGNOSIS — E1122 Type 2 diabetes mellitus with diabetic chronic kidney disease: Secondary | ICD-10-CM | POA: Diagnosis not present

## 2019-05-20 DIAGNOSIS — I10 Essential (primary) hypertension: Secondary | ICD-10-CM | POA: Diagnosis not present

## 2019-05-21 ENCOUNTER — Other Ambulatory Visit: Payer: Self-pay

## 2019-05-24 ENCOUNTER — Inpatient Hospital Stay: Payer: Medicare PPO

## 2019-05-24 ENCOUNTER — Other Ambulatory Visit: Payer: Self-pay

## 2019-05-24 VITALS — BP 106/61 | HR 63 | Temp 96.6°F | Resp 16

## 2019-05-24 DIAGNOSIS — G8929 Other chronic pain: Secondary | ICD-10-CM | POA: Diagnosis not present

## 2019-05-24 DIAGNOSIS — R197 Diarrhea, unspecified: Secondary | ICD-10-CM | POA: Diagnosis not present

## 2019-05-24 DIAGNOSIS — D631 Anemia in chronic kidney disease: Secondary | ICD-10-CM | POA: Diagnosis not present

## 2019-05-24 DIAGNOSIS — R509 Fever, unspecified: Secondary | ICD-10-CM | POA: Diagnosis not present

## 2019-05-24 DIAGNOSIS — Z95828 Presence of other vascular implants and grafts: Secondary | ICD-10-CM

## 2019-05-24 DIAGNOSIS — C9001 Multiple myeloma in remission: Secondary | ICD-10-CM

## 2019-05-24 DIAGNOSIS — C9000 Multiple myeloma not having achieved remission: Secondary | ICD-10-CM | POA: Diagnosis not present

## 2019-05-24 DIAGNOSIS — M549 Dorsalgia, unspecified: Secondary | ICD-10-CM | POA: Diagnosis not present

## 2019-05-24 DIAGNOSIS — G629 Polyneuropathy, unspecified: Secondary | ICD-10-CM | POA: Diagnosis not present

## 2019-05-24 DIAGNOSIS — N183 Chronic kidney disease, stage 3 unspecified: Secondary | ICD-10-CM | POA: Diagnosis not present

## 2019-05-24 LAB — MAGNESIUM: Magnesium: 1.1 mg/dL — ABNORMAL LOW (ref 1.7–2.4)

## 2019-05-24 MED ORDER — SODIUM CHLORIDE 0.9% FLUSH
10.0000 mL | INTRAVENOUS | Status: DC | PRN
  Administered 2019-05-24: 10 mL via INTRAVENOUS
  Filled 2019-05-24: qty 10

## 2019-05-24 MED ORDER — SODIUM CHLORIDE 0.9 % IV SOLN
Freq: Once | INTRAVENOUS | Status: AC
  Administered 2019-05-24: 09:00:00 via INTRAVENOUS
  Filled 2019-05-24: qty 250

## 2019-05-24 MED ORDER — MAGNESIUM SULFATE 4 GM/100ML IV SOLN
4.0000 g | Freq: Once | INTRAVENOUS | Status: AC
  Administered 2019-05-24: 4 g via INTRAVENOUS
  Filled 2019-05-24: qty 100

## 2019-05-24 MED ORDER — HEPARIN SOD (PORK) LOCK FLUSH 100 UNIT/ML IV SOLN
500.0000 [IU] | Freq: Once | INTRAVENOUS | Status: AC
  Administered 2019-05-24: 500 [IU] via INTRAVENOUS
  Filled 2019-05-24: qty 5

## 2019-05-24 NOTE — Patient Instructions (Signed)
Magnesium Sulfate injection What is this medicine? MAGNESIUM SULFATE (mag NEE zee um SUL fate) is an electrolyte injection commonly used to treat low magnesium levels in your blood. It is also used to prevent or control seizures in women with preeclampsia or eclampsia. This medicine may be used for other purposes; ask your health care provider or pharmacist if you have questions. What should I tell my health care provider before I take this medicine? They need to know if you have any of these conditions:  heart disease  history of irregular heart beat  kidney disease  an unusual or allergic reaction to magnesium sulfate, medicines, foods, dyes, or preservatives  pregnant or trying to get pregnant  breast-feeding How should I use this medicine? This medicine is for infusion into a vein. It is given by a health care professional in a hospital or clinic setting. Talk to your pediatrician regarding the use of this medicine in children. While this drug may be prescribed for selected conditions, precautions do apply. Overdosage: If you think you have taken too much of this medicine contact a poison control center or emergency room at once. NOTE: This medicine is only for you. Do not share this medicine with others. What if I miss a dose? This does not apply. What may interact with this medicine? This medicine may interact with the following medications:  certain medicines for anxiety or sleep  certain medicines for seizures like phenobarbital  digoxin  medicines that relax muscles for surgery  narcotic medicines for pain This list may not describe all possible interactions. Give your health care provider a list of all the medicines, herbs, non-prescription drugs, or dietary supplements you use. Also tell them if you smoke, drink alcohol, or use illegal drugs. Some items may interact with your medicine. What should I watch for while using this medicine? Your condition will be  monitored carefully while you are receiving this medicine. You may need blood work done while you are receiving this medicine. What side effects may I notice from receiving this medicine? Side effects that you should report to your doctor or health care professional as soon as possible:  allergic reactions like skin rash, itching or hives, swelling of the face, lips, or tongue  facial flushing  muscle weakness  signs and symptoms of low blood pressure like dizziness; feeling faint or lightheaded, falls; unusually weak or tired  signs and symptoms of a dangerous change in heartbeat or heart rhythm like chest pain; dizziness; fast or irregular heartbeat; palpitations; breathing problems  sweating This list may not describe all possible side effects. Call your doctor for medical advice about side effects. You may report side effects to FDA at 1-800-FDA-1088. Where should I keep my medicine? This drug is given in a hospital or clinic and will not be stored at home. NOTE: This sheet is a summary. It may not cover all possible information. If you have questions about this medicine, talk to your doctor, pharmacist, or health care provider.  2020 Elsevier/Gold Standard (2016-01-03 12:31:42)  

## 2019-05-25 LAB — PROTEIN ELECTROPHORESIS, SERUM
A/G Ratio: 1.6 (ref 0.7–1.7)
Albumin ELP: 3.9 g/dL (ref 2.9–4.4)
Alpha-1-Globulin: 0.2 g/dL (ref 0.0–0.4)
Alpha-2-Globulin: 0.9 g/dL (ref 0.4–1.0)
Beta Globulin: 1 g/dL (ref 0.7–1.3)
Gamma Globulin: 0.4 g/dL (ref 0.4–1.8)
Globulin, Total: 2.5 g/dL (ref 2.2–3.9)
Total Protein ELP: 6.4 g/dL (ref 6.0–8.5)

## 2019-05-25 LAB — KAPPA/LAMBDA LIGHT CHAINS
Kappa free light chain: 12.6 mg/L (ref 3.3–19.4)
Kappa, lambda light chain ratio: 0.03 — ABNORMAL LOW (ref 0.26–1.65)
Lambda free light chains: 420.7 mg/L — ABNORMAL HIGH (ref 5.7–26.3)

## 2019-05-26 ENCOUNTER — Other Ambulatory Visit: Payer: Self-pay

## 2019-05-26 ENCOUNTER — Encounter: Payer: Self-pay | Admitting: Hematology and Oncology

## 2019-05-26 ENCOUNTER — Ambulatory Visit (INDEPENDENT_AMBULATORY_CARE_PROVIDER_SITE_OTHER): Payer: Medicare PPO | Admitting: Gastroenterology

## 2019-05-26 DIAGNOSIS — K5903 Drug induced constipation: Secondary | ICD-10-CM | POA: Diagnosis not present

## 2019-05-26 NOTE — Progress Notes (Signed)
Sarah [name] , MD [address]  [place]  [place], [place] [address]  Main: [phone]  Fax: [phone]   Primary Care Physician: Glean Hess, MD  Virtual Visit via Video Note  I connected with patient on 05/26/19 at 10:45 AM EST by video and verified that I am speaking with the correct person using two identifiers.   I discussed the limitations, risks, security and privacy concerns of performing an evaluation and management service by video  and the availability of in person appointments. I also discussed with the patient that there may be a patient responsible charge related to this service. The patient expressed understanding and agreed to proceed.  Location of Patient: Home Location of Provider: Home Persons involved: Patient and provider only   History of Present Illness:   Follow-up for constipation  HPI: Sarah [name] is a 63 y.o. female  Summary of history :  She was last seen in my office on 05/19/2019. I have seen her back in 2018 for history of diarrhea alternating with constipation.She has a history of multiple myeloma , constipation, has been using fentanyl patch for chronic pain. Bowel movements are preceded by abdominal distention and bloating. Has tried MiraLAX, fiber supplements docusate, senna which has not worked. Denies use of any NSAIDs she is on metformin. Complains of chronic nausea that began shortly after she eats.  She has a sensation of food sitting in her stomach for a long period of time. We did initially try to put her on a high-fiber diet but it caused her to get significant diarrhea and it was stopped.  05/07/2017: EGD: Biopsies of stomach showed antral type mucosa with features of a healing mucosal injury. I also performed a colonoscopy on the same day which was normal.  Interval history11/18/2020-05/26/2019  Since her last visit she has increased the dose of lactulose to 30 cc twice daily and has had significant  improvement with at least 3 bowel movements per week.  She seems to be happy with the results.  Current Outpatient Medications  Medication Sig Dispense Refill  . allopurinol (ZYLOPRIM) 100 MG tablet TAKE 1 TABLET(100 MG) BY MOUTH DAILY as needed (Patient not taking: Reported on 04/06/2019) 90 tablet 1  . aspirin 81 MG chewable tablet Chew 1 tablet (81 mg total) by mouth daily. 30 tablet 0  . bisoprolol (ZEBETA) 10 MG tablet TAKE 1 TABLET(10 MG) BY MOUTH DAILY 90 tablet 0  . diclofenac sodium (VOLTAREN) 1 % GEL Apply 2 g topically 4 (four) times daily. 100 g 1  . diphenoxylate-atropine (LOMOTIL) 2.5-0.025 MG tablet Take 2 tablets by mouth daily as needed for diarrhea or loose stools. 45 tablet 2  . fentaNYL (DURAGESIC - DOSED MCG/HR) 25 MCG/HR patch Place 25 mcg onto the skin every 3 (three) days.     . fexofenadine (ALLEGRA) 180 MG tablet TAKE 1 TABLET(180 MG) BY MOUTH DAILY 30 tablet 5  . FLUoxetine (PROZAC) 40 MG capsule Take 1 capsule (40 mg total) by mouth daily. 90 capsule 1  . fluticasone (FLONASE) 50 MCG/ACT nasal spray Place 2 sprays into both nostrils daily. 16 g 2  . furosemide (LASIX) 20 MG tablet Take 20 mg by mouth as needed.     Marland Kitchen glucose blood (ONE TOUCH ULTRA TEST) test strip     . hydrOXYzine (ATARAX/VISTARIL) 10 MG tablet TAKE 1 TABLET(10 MG) BY MOUTH THREE TIMES DAILY AS NEEDED 90 tablet 0  . lactulose (CHRONULAC) 10 GM/15ML solution Take 15 mLs (10  g total) by mouth daily after breakfast. 915 mL 1  . lidocaine-prilocaine (EMLA) cream APPLY TOPICALLY AS NEEDED. 30 g 0  . lisinopril (PRINIVIL,ZESTRIL) 10 MG tablet Take 10 mg by mouth daily.    . Magnesium Bisglycinate (MAG GLYCINATE) 100 MG TABS Take 100 mg by mouth 2 (two) times daily. 60 tablet 0  . Menaquinone-7 (VITAMIN K2) 100 MCG CAPS Take 1 tablet by mouth daily.     . metFORMIN (GLUCOPHAGE-XR) 500 MG 24 hr tablet Take 1 tablet (500 mg total) by mouth daily with breakfast. 90 tablet 1  . Misc Natural Products (OSTEO  BI-FLEX ADV TRIPLE ST PO) Take 2 tablets by mouth daily.    . Multiple Vitamins-Minerals (HAIR/SKIN/NAILS/BIOTIN PO) Take 1 capsule by mouth 3 (three) times daily.    . NON FORMULARY     . Omega 3-6-9 Fatty Acids (OMEGA 3-6-9 COMPLEX) CAPS Take by mouth.    Marland Kitchen omeprazole (PRILOSEC) 40 MG capsule Take 1 capsule (40 mg total) by mouth daily. 90 capsule 1  . pravastatin (PRAVACHOL) 20 MG tablet TAKE 1 TABLET BY MOUTH  DAILY 90 tablet 1  . promethazine (PHENERGAN) 25 MG tablet TAKE 1 TABLET BY MOUTH TWICE DAILY BEFORE MEALS AS NEEDED 60 tablet 0  . tiZANidine (ZANAFLEX) 4 MG tablet Take 1 tablet (4 mg total) by mouth every 8 (eight) hours as needed for muscle spasms. 270 tablet 1  . traZODone (DESYREL) 100 MG tablet TAKE 1 TABLET AT BEDTIME  FOR  SLEEP 90 tablet 1   No current facility-administered medications for this visit.     Allergies as of 05/26/2019 - Review Complete 04/06/2019  Allergen Reaction Noted  . Oxycodone-acetaminophen Anaphylaxis 09/01/2011  . Celebrex [celecoxib] Diarrhea 01/04/2019  . Codeine  09/05/2017  . Benadryl [diphenhydramine] Palpitations 04/02/2016  . Morphine Itching and Rash 04/02/2016  . Ondansetron Diarrhea 09/01/2011  . Tylenol [acetaminophen] Itching and Rash 04/02/2016    Review of Systems:    All systems reviewed and negative except where noted in HPI.  General Appearance:    Alert, cooperative, no distress, appears stated age  Head:    Normocephalic, without obvious abnormality, atraumatic  Eyes:    PERRL, conjunctiva/corneas clear,  Ears:    Grossly normal hearing    Neurologic:  Grossly normal    Observations/Objective:  Labs: CMP     Component Value Date/Time   NA 136 04/05/2019 0922   NA 141 04/19/2016 1604   K 4.0 04/05/2019 0922   CL 104 04/05/2019 0922   CO2 21 (L) 04/05/2019 0922   GLUCOSE 143 (H) 04/05/2019 0922   BUN 31 (H) 04/05/2019 0922   BUN 22 04/19/2016 1604   CREATININE 1.62 (H) 04/05/2019 0922   CALCIUM 8.8 (L)  04/05/2019 0922   PROT 7.1 04/05/2019 0922   ALBUMIN 4.2 04/05/2019 0922   AST 36 04/05/2019 0922   ALT 37 04/05/2019 0922   ALKPHOS 86 04/05/2019 0922   BILITOT 0.4 04/05/2019 0922   GFRNONAA 34 (L) 04/05/2019 0922   GFRAA 39 (L) 04/05/2019 0922   Lab Results  Component Value Date   WBC 5.3 04/05/2019   HGB 10.9 (L) 04/05/2019   HCT 31.8 (L) 04/05/2019   MCV 85.9 04/05/2019   PLT 114 (L) 04/05/2019    Imaging Studies: No results found.  Assessment and Plan:   Sarah [name] is a 63 y.o. y/o female  here today to follow-up for constipation and chronic nausea. 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 She has tried and failed multiple agents in the past and was commenced on Amitiza on 03/23/2019 at a low dose that causes severe diarrhea.. Significant improvement after increasing the dose of lactulose.  She is presently taking 30 cc twice daily.  I explained to her she can even take 15 cc at night if she needs.  I explained to her that she is in control of the dose she can either dilate up or dilate down depending on the result she desires.  She has no other new issues at this visit.   Follow-up as needed    I discussed the assessment and treatment plan with the patient. The patient was provided an opportunity to ask questions and all were answered. The patient agreed with the plan and demonstrated an understanding of the instructions.   The patient was advised to call back or seek an in-person evaluation if the symptoms worsen or if the condition fails to improve as anticipated.   Dr Sarah Bellows MD,MRCP [facility]) Gastroenterology/Hepatology Pager: [phone]   Speech recognition software was used to dictate this note.

## 2019-05-29 NOTE — Progress Notes (Signed)
[facility]  [address], [address] [name], [place] [address] Phone: [phone]  Fax: [phone]   Clinic Day:  05/31/2019  Referring physician: Glean Hess, MD  Chief Complaint: Sarah [name] is a 63 y.o. 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 At that time, she was slightly fatigued.  She had lost 7 pounds secondary to recent diarrhea.  Exam was stable.  Magnesium was 1.1.  Potassium was 4.0.  She received IV magnesium.  She was seen by Dr. Nehemiah Massed for mild aortic valve stenosis on 04/06/2019. She had worsening dizziness for 2 weeks with lightheadedness, dimming vision, weakness and unsteadiness. Echo on 04/16/2019 showed ejection fraction of 50%.  She had follow-up on 04/20/2019.  She has a diagnosis of mild non rheumatic aortic valve stenosis with stable heart murmur. Healthy lifestyle choices and continuation of medicine regime were discussed. She has a 6 month follow up.  She met with Dr. Vicente Males via telehealth on 04/06/2019 for drug induced constipation and possible drug induced gastroparesis. Lactulose was prescribed; she was told to follow up in 4 weeks. She met with Dr. Vicente Males via telehealth on 05/26/2019. She had increased herself to 30 cc twice daily lactulose and had seen significant improvement. She was to follow up as needed.  She was seen by Dr. Jeneen Rinks for lumbar spinal stenosis with pinched nerves. She is on a Fentanyl patch.   Magnesium has ranged between 1.1 and 1.4 during the interim.  She has received weekly IV magnesium.  She received a B12 injection on 04/19/2019  M-spike was 0 on 05/24/2019.  Kappa free light 12.6, Lambda free light 420.7 (elevated) with a ratio of 0.03.   During the interim, she notes right sided neck pain with tenderness. Pain is mostly up near her ear.  She denies any fever, has not pain with chewing or yawning. Ear is only tender to touch; she is unaware of any ear infection.  Symptomatically, her diarrhea is doing much better. She is unable to absorb to her magnesium.  Her nausea remains the same. She has had no infections.  She is going to have an interstem surgery to help with her bladder spasms on 06/16/2019.  Her stem cells are stored at [facility] in [place], [place].   Past Medical History:  Diagnosis Date   Abnormal stress test 02/14/2016   Overview:  Added automatically from request for surgery [id number]   Anemia    Anxiety    Arthritis    Bicuspid aortic valve    CHF (congestive heart failure) (HCC)    CKD (chronic kidney disease) stage 3, GFR 30-59 ml/min    Depression    Diabetes mellitus (HCC)    Dizziness    Fatty liver    Frequent falls    GERD (gastroesophageal reflux disease)    Gout    Heart murmur    History of blood transfusion    History of bone marrow transplant ([place])    History of uterine fibroid    Hx of cardiac catheterization 06/05/2016   Overview:  Normal coronaries 2017   Hypertension    Hypomagnesemia    Multiple myeloma (HCC)    Personal history of chemotherapy    Renal cyst     Past Surgical History:  Procedure Laterality Date   ABDOMINAL HYSTERECTOMY     Auto  Stem Cell transplant  06/2015   CARDIAC ELECTROPHYSIOLOGY MAPPING AND ABLATION     CARPAL TUNNEL RELEASE Bilateral    CHOLECYSTECTOMY  2008   COLONOSCOPY WITH PROPOFOL N/A 05/07/2017   Procedure: COLONOSCOPY WITH PROPOFOL;  Surgeon: [name], MD;  Location: [facility] ENDOSCOPY;  Service: Gastroenterology;  Laterality: N/A;   ESOPHAGOGASTRODUODENOSCOPY (EGD) WITH PROPOFOL N/A 05/07/2017   Procedure: ESOPHAGOGASTRODUODENOSCOPY (EGD) WITH PROPOFOL;  Surgeon: [name], MD;  Location: [facility] ENDOSCOPY;  Service: Gastroenterology;  Laterality: N/A;   FOOT SURGERY Bilateral    INCONTINENCE SURGERY  2009    PARTIAL HYSTERECTOMY  03/1996   fibroids   PORTA CATH INSERTION N/A 03/10/2019   Procedure: PORTA CATH INSERTION;  Surgeon: Algernon Huxley, MD;  Location: [facility];  Service: Cardiovascular;  Laterality: N/A;   TONSILLECTOMY  2007    Family History  Problem Relation Age of Onset   Colon cancer Father    Renal Disease Father    Diabetes Mellitus II Father    Melanoma Paternal Grandmother    Breast cancer Maternal Aunt 70   Anemia Mother    Heart disease Mother    Heart failure Mother    Renal Disease Mother    Congestive Heart Failure Mother    Heart disease Maternal Uncle    Throat cancer Maternal Uncle    Lung cancer Maternal Uncle    Liver disease Maternal Uncle    Heart failure Maternal Uncle    Hearing loss Son 71       Suicide     Social History:  reports that she quit smoking about 27 years ago. Her smoking use included cigarettes. She has a 20.00 pack-year smoking history. She has never used smokeless tobacco. She reports previous alcohol use. She reports that she does not use drugs. Patient has not had ETOH in several months. She is on disability. She notes exposure to perchloroethylene [facility]).She lives in [place]. The patient is alone today.  Allergies:  Allergies  Allergen Reactions   Oxycodone-Acetaminophen Anaphylaxis    Swelling and rash   Celebrex [Celecoxib] Diarrhea   Codeine    Benadryl [Diphenhydramine] Palpitations   Morphine Itching and Rash   Ondansetron Diarrhea   Tylenol [Acetaminophen] Itching and Rash    Current Medications: Current Outpatient Medications  Medication Sig Dispense Refill   bisoprolol (ZEBETA) 10 MG tablet TAKE 1 TABLET(10 MG) BY MOUTH DAILY 90 tablet 0   diclofenac sodium (VOLTAREN) 1 % GEL Apply 2 g topically 4 (four) times daily. 100 g 1   diphenoxylate-atropine (LOMOTIL) 2.5-0.025 MG tablet Take 2 tablets by mouth daily as needed for diarrhea or loose stools. 45 tablet 2   fentaNYL  (DURAGESIC - DOSED MCG/HR) 25 MCG/HR patch Place 25 mcg onto the skin every 3 (three) days.      FLUoxetine (PROZAC) 40 MG capsule Take 1 capsule (40 mg total) by mouth daily. 90 capsule 1   glucose blood (ONE TOUCH ULTRA TEST) test strip      lactulose (CHRONULAC) 10 GM/15ML solution Take 15 mLs (10 g total) by mouth daily after breakfast. (Patient taking differently: Take 15 g by mouth 3 (three) times daily. ) 915 mL 1   lisinopril (PRINIVIL,ZESTRIL) 10 MG tablet Take 10 mg by mouth daily.     Magnesium Bisglycinate (MAG GLYCINATE) 100 MG TABS Take 100 mg by mouth 2 (two) times daily. 60 tablet 0   metFORMIN (GLUCOPHAGE-XR) 500 MG 24 hr tablet Take 1 tablet (500 mg total) by mouth daily  with breakfast. 90 tablet 1   Misc Natural Products (OSTEO BI-FLEX ADV TRIPLE ST PO) Take 2 tablets by mouth daily.     Multiple Vitamins-Minerals (HAIR/SKIN/NAILS/BIOTIN PO) Take 1 capsule by mouth 3 (three) times daily.     NON FORMULARY      Omega 3-6-9 Fatty Acids (OMEGA 3-6-9 COMPLEX) CAPS Take by mouth.     omeprazole (PRILOSEC) 40 MG capsule Take 1 capsule (40 mg total) by mouth daily. 90 capsule 1   pravastatin (PRAVACHOL) 20 MG tablet TAKE 1 TABLET BY MOUTH  DAILY 90 tablet 1   promethazine (PHENERGAN) 25 MG tablet TAKE 1 TABLET BY MOUTH TWICE DAILY BEFORE MEALS AS NEEDED (Patient taking differently: 50 mg. TAKE 1 TABLET BY MOUTH TWICE DAILY BEFORE MEALS AS NEEDED) 60 tablet 0   tiZANidine (ZANAFLEX) 4 MG tablet Take 1 tablet (4 mg total) by mouth every 8 (eight) hours as needed for muscle spasms. 270 tablet 1   traZODone (DESYREL) 100 MG tablet TAKE 1 TABLET AT BEDTIME  FOR  SLEEP 90 tablet 1   allopurinol (ZYLOPRIM) 100 MG tablet TAKE 1 TABLET(100 MG) BY MOUTH DAILY as needed (Patient not taking: Reported on 04/06/2019) 90 tablet 1   aspirin 81 MG chewable tablet Chew 1 tablet (81 mg total) by mouth daily. (Patient not taking: Reported on 05/26/2019) 30 tablet 0   fexofenadine (ALLEGRA)  180 MG tablet TAKE 1 TABLET(180 MG) BY MOUTH DAILY (Patient not taking: Reported on 05/26/2019) 30 tablet 5   fluticasone (FLONASE) 50 MCG/ACT nasal spray Place 2 sprays into both nostrils daily. (Patient not taking: Reported on 05/26/2019) 16 g 2   furosemide (LASIX) 20 MG tablet Take 20 mg by mouth as needed.      hydrOXYzine (ATARAX/VISTARIL) 10 MG tablet TAKE 1 TABLET(10 MG) BY MOUTH THREE TIMES DAILY AS NEEDED (Patient not taking: Reported on 05/26/2019) 90 tablet 0   lidocaine-prilocaine (EMLA) cream APPLY TOPICALLY AS NEEDED. (Patient not taking: Reported on 05/26/2019) 30 g 0   Menaquinone-7 (VITAMIN K2) 100 MCG CAPS Take 1 tablet by mouth daily.      No current facility-administered medications for this visit.    Facility-Administered Medications Ordered in Other Visits  Medication Dose Route Frequency Provider Last Rate Last Dose   heparin lock flush 100 unit/mL  500 Units Intravenous Once Lametria Klunk C, MD       sodium chloride flush (NS) 0.9 % injection 10 mL  10 mL Intravenous PRN Lequita Asal, MD   10 mL at 05/31/19 0855    Review of Systems  Constitutional: Negative.  Negative for chills, diaphoresis, fever, malaise/fatigue and weight loss (up 4 lbs).       Feels "ok".  HENT: Negative.  Negative for congestion, hearing loss, nosebleeds, sinus pain and sore throat.        Neck and right ear pain.  Eyes: Negative.  Negative for blurred vision and double vision.  Respiratory: Negative.  Negative for cough, shortness of breath and wheezing.   Cardiovascular: Negative.  Negative for chest pain, palpitations, orthopnea, leg swelling and PND.  Gastrointestinal: Positive for heartburn and nausea (chronic s/p gallbladder surgery). Negative for abdominal pain, blood in stool, constipation, diarrhea (improved with lactulose), melena and vomiting.       Poor appetite.  Genitourinary: Negative for dysuria, frequency, hematuria and urgency.       Bladder leakage (f/u  urology bladder sling).  Bladder spasms.  Musculoskeletal: Positive for back pain (chronic, spinal stenosis and pinched nerve),  joint pain (arthritis), myalgias (leg cramps, worse at night) and neck pain (3 pinched nerves in neck).  Skin: Negative.  Negative for rash.  Neurological: Positive for sensory change (neuropathy in hands and feet). Negative for dizziness, speech change, focal weakness, seizures, weakness and headaches.  Endo/Heme/Allergies: Negative.  Does not bruise/bleed easily.       Diabetes.  Psychiatric/Behavioral: Negative.  Negative for depression and memory loss. The patient is not nervous/anxious and does not have insomnia.   All other systems reviewed and are negative.  Performance status (ECOG):  1  Vitals Blood pressure 100/64, pulse 71, temperature 97.7 F (36.5 C), temperature source Tympanic, resp. rate 18, height 5' 2"  (1.575 m), weight 154 lb 5.2 oz (70 kg).   Physical Exam  Constitutional: She is oriented to person, place, and time. She appears well-developed and well-nourished. No distress.  HENT:  Head: Normocephalic and atraumatic.  Right Ear: External ear and ear canal normal. No foreign bodies. Tympanic membrane is not injected and not bulging. No middle ear effusion.  Left Ear: External ear and ear canal normal. No foreign bodies. Tympanic membrane is not injected and not bulging.  No middle ear effusion.  Mouth/Throat: Oropharynx is clear and moist. No oropharyngeal exudate.  Curly dark blonde hair pulled up.  Dry mouth.  Mask.  Eyes: Pupils are equal, round, and reactive to light. Conjunctivae and EOM are normal. No scleral icterus.  Glasses.  Neck: Normal range of motion. Neck supple. No JVD present.  Moderate SCM muscle tightness.  Cardiovascular: Normal rate, regular rhythm and normal heart sounds.  No murmur heard. Pulmonary/Chest: Effort normal and breath sounds normal. No respiratory distress. She has no wheezes. She has no rales.  Abdominal:  Soft. Bowel sounds are normal. She exhibits no distension and no mass. There is no abdominal tenderness. There is no rebound and no guarding.  Musculoskeletal: Normal range of motion.        General: Tenderness (bilateral lower extremities) present. No edema.     Right knee: She exhibits no swelling.     Left knee: She exhibits no swelling.     Right ankle: She exhibits no swelling.     Left ankle: She exhibits no swelling.     Thoracic back: Tenderness: slight.  Lymphadenopathy:       Head (right side): No submandibular, no preauricular and no posterior auricular adenopathy present.       Head (left side): No submandibular, no preauricular and no posterior auricular adenopathy present.    She has no cervical adenopathy.    She has no axillary adenopathy.       Right: No supraclavicular adenopathy present.       Left: No supraclavicular adenopathy present.  Neurological: She is alert and oriented to person, place, and time.  Skin: Skin is warm and dry. No rash noted. She is not diaphoretic. No erythema. No pallor.  Psychiatric: She has a normal mood and affect. Her behavior is normal. Judgment and thought content normal.  Nursing note and vitals reviewed.   Infusion on 05/31/2019  Component Date Value Ref Range Status   Magnesium 05/31/2019 1.0* 1.7 - 2.4 mg/dL Final   Performed at [facility], [address], [place], [place] [address]   Sodium 05/31/2019 134* 135 - 145 mmol/L Final   Potassium 05/31/2019 4.3  3.5 - 5.1 mmol/L Final   Chloride 05/31/2019 103  98 - 111 mmol/L Final   CO2 05/31/2019 21* 22 - 32 mmol/L Final  Glucose, Bld 05/31/2019 116* 70 - 99 mg/dL Final   BUN 05/31/2019 35* 8 - 23 mg/dL Final   Creatinine, Ser 05/31/2019 1.45* 0.44 - 1.00 mg/dL Final   Calcium 05/31/2019 8.7* 8.9 - 10.3 mg/dL Final   Total Protein 05/31/2019 6.7  6.5 - 8.1 g/dL Final   Albumin 05/31/2019 4.3  3.5 - 5.0 g/dL Final   AST 05/31/2019 23  15 - 41 U/L Final     ALT 05/31/2019 21  0 - 44 U/L Final   Alkaline Phosphatase 05/31/2019 80  38 - 126 U/L Final   Total Bilirubin 05/31/2019 0.4  0.3 - 1.2 mg/dL Final   GFR calc non Af Amer 05/31/2019 38* >60 mL/min Final   GFR calc Af Amer 05/31/2019 45* >60 mL/min Final   Anion gap 05/31/2019 10  5 - 15 Final   Performed at [facility], [address] [place], [place] [address]   WBC 05/31/2019 5.2  4.0 - 10.5 K/uL Final   RBC 05/31/2019 3.32* 3.87 - 5.11 MIL/uL Final   Hemoglobin 05/31/2019 9.9* 12.0 - 15.0 g/dL Final   HCT 05/31/2019 29.2* 36.0 - 46.0 % Final   MCV 05/31/2019 88.0  80.0 - 100.0 fL Final   MCH 05/31/2019 29.8  26.0 - 34.0 pg Final   MCHC 05/31/2019 33.9  30.0 - 36.0 g/dL Final   RDW 05/31/2019 13.0  11.5 - 15.5 % Final   Platelets 05/31/2019 131* 150 - 400 K/uL Final   Comment: Immature Platelet Fraction may be clinically indicated, consider ordering this additional test [id number]    nRBC 05/31/2019 0.0  0.0 - 0.2 % Final   Neutrophils Relative % 05/31/2019 58  % Final   Neutro Abs 05/31/2019 3.1  1.7 - 7.7 K/uL Final   Lymphocytes Relative 05/31/2019 27  % Final   Lymphs Abs 05/31/2019 1.4  0.7 - 4.0 K/uL Final   Monocytes Relative 05/31/2019 10  % Final   Monocytes Absolute 05/31/2019 0.5  0.1 - 1.0 K/uL Final   Eosinophils Relative 05/31/2019 3  % Final   Eosinophils Absolute 05/31/2019 0.1  0.0 - 0.5 K/uL Final   Basophils Relative 05/31/2019 1  % Final   Basophils Absolute 05/31/2019 0.0  0.0 - 0.1 K/uL Final   Immature Granulocytes 05/31/2019 1  % Final   Abs Immature Granulocytes 05/31/2019 0.03  0.00 - 0.07 K/uL Final   Performed at [facility], [address] [place], [place] [address]    Assessment:  Sarah [name] is a 63 y.o. 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 Bone marrow revealed 80% plasma  cells. Lambda free light chains were 1340.  She had nephrotic range proteinuria.  She initially underwent induction with RVD. Revlimid maintenance was discontinued on 01/21/2017 secondary to intolerance.   M-spikehas been followed: 0 on 04/02/2016 - 05/24/2019; and 0.2 on 09/02/2017.  Lambda light chainshave been followed: 22.2 (ratio 0.56) on 07/03/2017, 30.8 (ratio 0.78) on 09/02/2017, 36.9 (ratio 0.40) on 10/21/2017, 37.4 (ratio 0.41) on 12/16/2017, 70.7(ratio 0.31) on 02/17/2018, 64.2 (ratio 0.27) on 04/07/2018, 78.9 (ratio 0.18) on 05/26/2018, 128.8 (ratio 0.17) on 08/06/2018, 181.5 (ratio 0.13) on 10/08/2018, 130.9 (ratio 0.13) on 10/20/2018, 160.7 (ratio 0.10)on 12/09/2018, 236.6 (ratio 0.07) on 02/01/2019, 363.6 (ratio 0.04) on 03/22/2019, 404.8 (ratio 0.04) on 04/05/2019, and 420.7 (ratio 0.03) on 05/24/2019.  Bone surveyon 04/08/2016 and11/28/2018 revealed no definite lytic lesion seen  in the visualized skeleton.Bone surveyon 11/19/2018 revealed no suspicious lucent lesionsand no acute bony abnormality.  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 Zometa was discontinued in 01/2017.  She has B12 deficiency. B12 was 254 on 04/09/2017, 295 on 08/20/2018, and 391 on 10/08/2018. Shewas onoral B12.She received B12 monthly (last 04/19/2019).  Folate was 21.6 on 08/20/2018.  Symptomatically, she notes right sided neck pain which radiates into ear.  She denies any fevers or interval infections.  Exam reveals tense SCM muscles.  Ear exam is unremarkable.  Plan: 1.  Labs today:  CBC with diff, CMP, Mg. 2. Stage III multiple myeloma Clinically, she appears to be doing well. Sheis s/p autologous stem cell transplant in 06/2015.  She is off maintenance Revlimid secondary to intolerance.  M spike is 0 today. Discuss increasing lambda light chains:   Lambda light chains were 236.6  (ratio 0.07) on 02/01/2019.   Lambda light chains were 363.6 (ratio 0.04) on 03/22/2019.   Lambda light chains were 404.8 (ratio 0.04) on 04/05/2019.   Lambda light chains were 420.07 (ratio 0.03) on 05/24/2019. Bone survey on 11/19/2018 revealed no lytic lesions. Discuss 24 hour UPEP and free light chains.   Discuss consideration of restaging studies if increased urinary free light chains.  Marland Kitchen  PET scan and bone marrow. 3. Normocytic anemia Hematocrit 29.2. Hemoglobin 9.9. MCV 88.0.  Platelets 131,000. Ferritin 67 with an iron saturation of 19% and TIBC 340 on08/04/2019. B12 248 (low) and folate 18.6.  TSH was normal on 08/20/2018  Creatinine 1.45 (CrCl36.9 ml/minute) secondary to dehydration. Retic1.3% on 02/08/2019. Anemia is likely multifactorial with some component of chronic renal disease.   Possible component of myeloma.             Continue to monitor. 4. Stage III chronic kidney disease BUN35. Creatinine1.45today. Creatinine has fluctuated between 1.07-1.66 in the past 6 months.  Collect 24 hour urine for UPEP and free light chains.  She is followed by Dr. Holley Raring. 5. Hypomagnesemia, chronic Magnesium1.0 today. Magnesium has fluctuated between 1.1-1.4 in the past 2 months. Magnesium sulfate 4 gm IV today.  RTC on 06/03/2019 for labs (Mg) and +/- IV Mg.  RTC weekly for labs (Mg) and+/-IV magnesium.  6. Osteonecrosis of the jaw ONJ occurred in 01/2017 secondary to Zometa. She receives no further bisphosphonates. 7. Chronic back pain Etiology is not secondary to multiple myeloma. She is followed by the [facility] pain clinic and is on Fentanyl patches.  Unclear significance of right neck pain. 8. Grade I-II peripheral  neuropathy Etiology felt secondary to prior chemotherapy. Continue to monitor. 9.Chronic nausea Patient is followed by Dr. Vicente Males. Continue Phenergan 25 mg p.o. BID before mealsprn. 10.   B12 deficiency  B12 was 391 on 10/08/2018.  She was on oral B12.  B12 injections began on 02/15/2019 (last 04/19/2019).  B12 today and monthly x 6.  Folate was 21.6 on 08/20/2018.  Follow folate yearly. 11.   Collect 24 hour urine for UPEP and free light chains. 46.   MD to call patient with 24 hour urine (consider PET scan +/- bone marrow). 13.   RTC on 06/03/2019 for labs (Mg), and +/- IV Mg. 14.   RTC weekly for labs (Mg), and +/- IV Mg. 15.   RTC in 1 month for MD assessment, labs (CBC with diff, CMP, SPEP, FLCA, Mg), and IV Mg, and B12 injection.  I discussed the assessment and treatment plan with the patient.  The patient was provided  an opportunity to ask questions and all were answered.  The patient agreed with the plan and demonstrated an understanding of the instructions.  The patient was advised to call back if the symptoms worsen or if the condition fails to improve as anticipated.  I provided 20 minutes (09:55 AM - 10:15 AM) of face-to-face time during this this encounter and > 50% was spent counseling as documented under my assessment and plan.    Lequita Asal, MD, PhD    05/31/2019, 10:15 AM  I, Samul Dada, am acting as a scribe for Lequita Asal, MD.  I, [place] Mike Gip, MD, have reviewed the above documentation for accuracy and completeness, and I agree with the above.

## 2019-05-31 ENCOUNTER — Inpatient Hospital Stay (HOSPITAL_BASED_OUTPATIENT_CLINIC_OR_DEPARTMENT_OTHER): Payer: Medicare PPO

## 2019-05-31 ENCOUNTER — Encounter: Payer: Self-pay | Admitting: Hematology and Oncology

## 2019-05-31 ENCOUNTER — Other Ambulatory Visit: Payer: Self-pay

## 2019-05-31 ENCOUNTER — Inpatient Hospital Stay: Payer: Medicare PPO | Admitting: Hematology and Oncology

## 2019-05-31 ENCOUNTER — Inpatient Hospital Stay: Payer: Medicare PPO

## 2019-05-31 VITALS — BP 100/64 | HR 71 | Temp 97.7°F | Resp 18 | Ht 62.0 in | Wt 154.3 lb

## 2019-05-31 DIAGNOSIS — D649 Anemia, unspecified: Secondary | ICD-10-CM

## 2019-05-31 DIAGNOSIS — N183 Chronic kidney disease, stage 3 unspecified: Secondary | ICD-10-CM | POA: Diagnosis not present

## 2019-05-31 DIAGNOSIS — Z7189 Other specified counseling: Secondary | ICD-10-CM | POA: Diagnosis not present

## 2019-05-31 DIAGNOSIS — G8929 Other chronic pain: Secondary | ICD-10-CM | POA: Diagnosis not present

## 2019-05-31 DIAGNOSIS — M549 Dorsalgia, unspecified: Secondary | ICD-10-CM | POA: Diagnosis not present

## 2019-05-31 DIAGNOSIS — G620 Drug-induced polyneuropathy: Secondary | ICD-10-CM | POA: Diagnosis not present

## 2019-05-31 DIAGNOSIS — E538 Deficiency of other specified B group vitamins: Secondary | ICD-10-CM | POA: Diagnosis not present

## 2019-05-31 DIAGNOSIS — R509 Fever, unspecified: Secondary | ICD-10-CM | POA: Diagnosis not present

## 2019-05-31 DIAGNOSIS — C9000 Multiple myeloma not having achieved remission: Secondary | ICD-10-CM | POA: Diagnosis not present

## 2019-05-31 DIAGNOSIS — D631 Anemia in chronic kidney disease: Secondary | ICD-10-CM | POA: Diagnosis not present

## 2019-05-31 DIAGNOSIS — T451X5A Adverse effect of antineoplastic and immunosuppressive drugs, initial encounter: Secondary | ICD-10-CM | POA: Diagnosis not present

## 2019-05-31 DIAGNOSIS — R197 Diarrhea, unspecified: Secondary | ICD-10-CM | POA: Diagnosis not present

## 2019-05-31 DIAGNOSIS — C9001 Multiple myeloma in remission: Secondary | ICD-10-CM

## 2019-05-31 DIAGNOSIS — G894 Chronic pain syndrome: Secondary | ICD-10-CM

## 2019-05-31 DIAGNOSIS — G629 Polyneuropathy, unspecified: Secondary | ICD-10-CM | POA: Diagnosis not present

## 2019-05-31 LAB — CBC WITH DIFFERENTIAL/PLATELET
Abs Immature Granulocytes: 0.03 10*3/uL (ref 0.00–0.07)
Basophils Absolute: 0 10*3/uL (ref 0.0–0.1)
Basophils Relative: 1 %
Eosinophils Absolute: 0.1 10*3/uL (ref 0.0–0.5)
Eosinophils Relative: 3 %
HCT: 29.2 % — ABNORMAL LOW (ref 36.0–46.0)
Hemoglobin: 9.9 g/dL — ABNORMAL LOW (ref 12.0–15.0)
Immature Granulocytes: 1 %
Lymphocytes Relative: 27 %
Lymphs Abs: 1.4 10*3/uL (ref 0.7–4.0)
MCH: 29.8 pg (ref 26.0–34.0)
MCHC: 33.9 g/dL (ref 30.0–36.0)
MCV: 88 fL (ref 80.0–100.0)
Monocytes Absolute: 0.5 10*3/uL (ref 0.1–1.0)
Monocytes Relative: 10 %
Neutro Abs: 3.1 10*3/uL (ref 1.7–7.7)
Neutrophils Relative %: 58 %
Platelets: 131 10*3/uL — ABNORMAL LOW (ref 150–400)
RBC: 3.32 MIL/uL — ABNORMAL LOW (ref 3.87–5.11)
RDW: 13 % (ref 11.5–15.5)
WBC: 5.2 10*3/uL (ref 4.0–10.5)
nRBC: 0 % (ref 0.0–0.2)

## 2019-05-31 LAB — COMPREHENSIVE METABOLIC PANEL
ALT: 21 U/L (ref 0–44)
AST: 23 U/L (ref 15–41)
Albumin: 4.3 g/dL (ref 3.5–5.0)
Alkaline Phosphatase: 80 U/L (ref 38–126)
Anion gap: 10 (ref 5–15)
BUN: 35 mg/dL — ABNORMAL HIGH (ref 8–23)
CO2: 21 mmol/L — ABNORMAL LOW (ref 22–32)
Calcium: 8.7 mg/dL — ABNORMAL LOW (ref 8.9–10.3)
Chloride: 103 mmol/L (ref 98–111)
Creatinine, Ser: 1.45 mg/dL — ABNORMAL HIGH (ref 0.44–1.00)
GFR calc Af Amer: 45 mL/min — ABNORMAL LOW (ref >60)
GFR calc non Af Amer: 38 mL/min — ABNORMAL LOW (ref >60)
Glucose, Bld: 116 mg/dL — ABNORMAL HIGH (ref 70–99)
Potassium: 4.3 mmol/L (ref 3.5–5.1)
Sodium: 134 mmol/L — ABNORMAL LOW (ref 135–145)
Total Bilirubin: 0.4 mg/dL (ref 0.3–1.2)
Total Protein: 6.7 g/dL (ref 6.5–8.1)

## 2019-05-31 LAB — MAGNESIUM: Magnesium: 1 mg/dL — ABNORMAL LOW (ref 1.7–2.4)

## 2019-05-31 MED ORDER — MAGNESIUM SULFATE 4 GM/100ML IV SOLN
4.0000 g | Freq: Once | INTRAVENOUS | Status: AC
  Administered 2019-05-31: 4 g via INTRAVENOUS

## 2019-05-31 MED ORDER — SODIUM CHLORIDE 0.9 % IV SOLN
Freq: Once | INTRAVENOUS | Status: AC
  Administered 2019-05-31: 10:00:00 via INTRAVENOUS
  Filled 2019-05-31: qty 250

## 2019-05-31 MED ORDER — HEPARIN SOD (PORK) LOCK FLUSH 100 UNIT/ML IV SOLN
500.0000 [IU] | Freq: Once | INTRAVENOUS | Status: AC
  Administered 2019-05-31: 500 [IU] via INTRAVENOUS
  Filled 2019-05-31: qty 5

## 2019-05-31 MED ORDER — CYANOCOBALAMIN 1000 MCG/ML IJ SOLN
1000.0000 ug | Freq: Once | INTRAMUSCULAR | Status: AC
  Administered 2019-05-31: 1000 ug via INTRAMUSCULAR
  Filled 2019-05-31: qty 1

## 2019-05-31 MED ORDER — MAGNESIUM SULFATE 4 GM/100ML IV SOLN
INTRAVENOUS | Status: AC
  Filled 2019-05-31: qty 100

## 2019-05-31 MED ORDER — SODIUM CHLORIDE 0.9% FLUSH
10.0000 mL | INTRAVENOUS | Status: DC | PRN
  Administered 2019-05-31: 10 mL via INTRAVENOUS
  Filled 2019-05-31: qty 10

## 2019-05-31 NOTE — Progress Notes (Signed)
Right side of neck tender to touch.

## 2019-06-03 ENCOUNTER — Inpatient Hospital Stay: Payer: Medicare PPO | Attending: Hematology and Oncology

## 2019-06-03 ENCOUNTER — Other Ambulatory Visit: Payer: Self-pay

## 2019-06-03 ENCOUNTER — Inpatient Hospital Stay: Payer: Medicare PPO

## 2019-06-03 VITALS — BP 128/66 | HR 65 | Temp 97.9°F | Resp 16

## 2019-06-03 DIAGNOSIS — C9001 Multiple myeloma in remission: Secondary | ICD-10-CM

## 2019-06-03 DIAGNOSIS — M879 Osteonecrosis, unspecified: Secondary | ICD-10-CM | POA: Diagnosis not present

## 2019-06-03 DIAGNOSIS — G629 Polyneuropathy, unspecified: Secondary | ICD-10-CM | POA: Diagnosis not present

## 2019-06-03 DIAGNOSIS — Z833 Family history of diabetes mellitus: Secondary | ICD-10-CM | POA: Insufficient documentation

## 2019-06-03 DIAGNOSIS — Z800 Family history of malignant neoplasm of digestive organs: Secondary | ICD-10-CM | POA: Diagnosis not present

## 2019-06-03 DIAGNOSIS — Z79899 Other long term (current) drug therapy: Secondary | ICD-10-CM | POA: Diagnosis not present

## 2019-06-03 DIAGNOSIS — Z808 Family history of malignant neoplasm of other organs or systems: Secondary | ICD-10-CM | POA: Insufficient documentation

## 2019-06-03 DIAGNOSIS — M549 Dorsalgia, unspecified: Secondary | ICD-10-CM | POA: Insufficient documentation

## 2019-06-03 DIAGNOSIS — Z841 Family history of disorders of kidney and ureter: Secondary | ICD-10-CM | POA: Insufficient documentation

## 2019-06-03 DIAGNOSIS — Z8379 Family history of other diseases of the digestive system: Secondary | ICD-10-CM | POA: Diagnosis not present

## 2019-06-03 DIAGNOSIS — Z832 Family history of diseases of the blood and blood-forming organs and certain disorders involving the immune mechanism: Secondary | ICD-10-CM | POA: Insufficient documentation

## 2019-06-03 DIAGNOSIS — Z822 Family history of deafness and hearing loss: Secondary | ICD-10-CM | POA: Insufficient documentation

## 2019-06-03 DIAGNOSIS — D649 Anemia, unspecified: Secondary | ICD-10-CM | POA: Insufficient documentation

## 2019-06-03 DIAGNOSIS — G8929 Other chronic pain: Secondary | ICD-10-CM | POA: Diagnosis not present

## 2019-06-03 DIAGNOSIS — N183 Chronic kidney disease, stage 3 unspecified: Secondary | ICD-10-CM | POA: Insufficient documentation

## 2019-06-03 DIAGNOSIS — Z8249 Family history of ischemic heart disease and other diseases of the circulatory system: Secondary | ICD-10-CM | POA: Insufficient documentation

## 2019-06-03 DIAGNOSIS — E538 Deficiency of other specified B group vitamins: Secondary | ICD-10-CM | POA: Insufficient documentation

## 2019-06-03 DIAGNOSIS — Z801 Family history of malignant neoplasm of trachea, bronchus and lung: Secondary | ICD-10-CM | POA: Insufficient documentation

## 2019-06-03 DIAGNOSIS — Z803 Family history of malignant neoplasm of breast: Secondary | ICD-10-CM | POA: Diagnosis not present

## 2019-06-03 DIAGNOSIS — R208 Other disturbances of skin sensation: Secondary | ICD-10-CM | POA: Diagnosis not present

## 2019-06-03 DIAGNOSIS — C9000 Multiple myeloma not having achieved remission: Secondary | ICD-10-CM

## 2019-06-03 LAB — MAGNESIUM: Magnesium: 1.4 mg/dL — ABNORMAL LOW (ref 1.7–2.4)

## 2019-06-03 MED ORDER — HEPARIN SOD (PORK) LOCK FLUSH 100 UNIT/ML IV SOLN
500.0000 [IU] | Freq: Once | INTRAVENOUS | Status: AC
  Administered 2019-06-03: 500 [IU] via INTRAVENOUS

## 2019-06-03 MED ORDER — MAGNESIUM SULFATE 4 GM/100ML IV SOLN
4.0000 g | Freq: Once | INTRAVENOUS | Status: AC
  Administered 2019-06-03: 4 g via INTRAVENOUS

## 2019-06-03 MED ORDER — SODIUM CHLORIDE 0.9% FLUSH
10.0000 mL | INTRAVENOUS | Status: DC | PRN
  Administered 2019-06-03: 10 mL via INTRAVENOUS
  Filled 2019-06-03: qty 10

## 2019-06-03 MED ORDER — SODIUM CHLORIDE 0.9 % IV SOLN
Freq: Once | INTRAVENOUS | Status: AC
  Administered 2019-06-03: 10:00:00 via INTRAVENOUS
  Filled 2019-06-03: qty 250

## 2019-06-04 LAB — FREE K+L LT CHAINS,QN,UR
Free Kappa Lt Chains,Ur: 95.76 mg/L (ref 0.63–113.79)
Free Kappa/Lambda Ratio: 0.08 — ABNORMAL LOW (ref 1.03–31.76)
Free Lambda Lt Chains,Ur: 1260.71 mg/L — ABNORMAL HIGH (ref 0.47–11.77)
Total Volume: 1200

## 2019-06-04 LAB — UPEP/TP, 24-HR URINE
Albumin, U: 18.7 %
Alpha 1, Urine: 3 %
Alpha 2, Urine: 8.6 %
Beta, Urine: 57.2 %
Gamma Globulin, Urine: 12.6 %
M-Spike, mg/24 hr: 276.7 mg/24 hr — ABNORMAL HIGH
M-spike, %: 42.7 % — ABNORMAL HIGH
Total Protein, Urine-Ur/day: 648 mg/24 hr — ABNORMAL HIGH (ref 30–150)
Total Protein, Urine: 54 mg/dL
Total Volume: 1200

## 2019-06-10 ENCOUNTER — Other Ambulatory Visit: Payer: Self-pay

## 2019-06-10 ENCOUNTER — Inpatient Hospital Stay: Payer: Medicare PPO

## 2019-06-10 DIAGNOSIS — G8929 Other chronic pain: Secondary | ICD-10-CM | POA: Diagnosis not present

## 2019-06-10 DIAGNOSIS — D649 Anemia, unspecified: Secondary | ICD-10-CM | POA: Diagnosis not present

## 2019-06-10 DIAGNOSIS — R110 Nausea: Secondary | ICD-10-CM

## 2019-06-10 DIAGNOSIS — C9000 Multiple myeloma not having achieved remission: Secondary | ICD-10-CM | POA: Diagnosis not present

## 2019-06-10 DIAGNOSIS — N183 Chronic kidney disease, stage 3 unspecified: Secondary | ICD-10-CM | POA: Diagnosis not present

## 2019-06-10 DIAGNOSIS — M549 Dorsalgia, unspecified: Secondary | ICD-10-CM | POA: Diagnosis not present

## 2019-06-10 DIAGNOSIS — G629 Polyneuropathy, unspecified: Secondary | ICD-10-CM | POA: Diagnosis not present

## 2019-06-10 DIAGNOSIS — M879 Osteonecrosis, unspecified: Secondary | ICD-10-CM | POA: Diagnosis not present

## 2019-06-10 DIAGNOSIS — R208 Other disturbances of skin sensation: Secondary | ICD-10-CM | POA: Diagnosis not present

## 2019-06-10 DIAGNOSIS — C9001 Multiple myeloma in remission: Secondary | ICD-10-CM

## 2019-06-10 LAB — MAGNESIUM: Magnesium: 1.1 mg/dL — ABNORMAL LOW (ref 1.7–2.4)

## 2019-06-10 MED ORDER — SODIUM CHLORIDE 0.9 % IV SOLN
Freq: Once | INTRAVENOUS | Status: AC
  Administered 2019-06-10: 14:00:00 via INTRAVENOUS
  Filled 2019-06-10: qty 250

## 2019-06-10 MED ORDER — HEPARIN SOD (PORK) LOCK FLUSH 100 UNIT/ML IV SOLN
250.0000 [IU] | Freq: Once | INTRAVENOUS | Status: AC | PRN
  Administered 2019-06-10: 250 [IU]
  Filled 2019-06-10: qty 5

## 2019-06-10 MED ORDER — SODIUM CHLORIDE 0.9% FLUSH
10.0000 mL | Freq: Once | INTRAVENOUS | Status: AC | PRN
  Administered 2019-06-10: 10 mL
  Filled 2019-06-10: qty 10

## 2019-06-10 MED ORDER — MAGNESIUM SULFATE 4 GM/100ML IV SOLN
4.0000 g | Freq: Once | INTRAVENOUS | Status: AC
  Administered 2019-06-10: 4 g via INTRAVENOUS
  Filled 2019-06-10: qty 100

## 2019-06-10 MED ORDER — PROMETHAZINE HCL 25 MG PO TABS: 25.0000 mg | ORAL_TABLET | Freq: Two times a day (BID) | ORAL | 0 refills | Status: DC | PRN

## 2019-06-10 NOTE — Patient Instructions (Signed)
Magnesium Sulfate injection What is this medicine? MAGNESIUM SULFATE (mag NEE zee um SUL fate) is an electrolyte injection commonly used to treat low magnesium levels in your blood. It is also used to prevent or control seizures in women with preeclampsia or eclampsia. This medicine may be used for other purposes; ask your health care provider or pharmacist if you have questions. What should I tell my health care provider before I take this medicine? They need to know if you have any of these conditions:  heart disease  history of irregular heart beat  kidney disease  an unusual or allergic reaction to magnesium sulfate, medicines, foods, dyes, or preservatives  pregnant or trying to get pregnant  breast-feeding How should I use this medicine? This medicine is for infusion into a vein. It is given by a health care professional in a hospital or clinic setting. Talk to your pediatrician regarding the use of this medicine in children. While this drug may be prescribed for selected conditions, precautions do apply. Overdosage: If you think you have taken too much of this medicine contact a poison control center or emergency room at once. NOTE: This medicine is only for you. Do not share this medicine with others. What if I miss a dose? This does not apply. What may interact with this medicine? This medicine may interact with the following medications:  certain medicines for anxiety or sleep  certain medicines for seizures like phenobarbital  digoxin  medicines that relax muscles for surgery  narcotic medicines for pain This list may not describe all possible interactions. Give your health care provider a list of all the medicines, herbs, non-prescription drugs, or dietary supplements you use. Also tell them if you smoke, drink alcohol, or use illegal drugs. Some items may interact with your medicine. What should I watch for while using this medicine? Your condition will be  monitored carefully while you are receiving this medicine. You may need blood work done while you are receiving this medicine. What side effects may I notice from receiving this medicine? Side effects that you should report to your doctor or health care professional as soon as possible:  allergic reactions like skin rash, itching or hives, swelling of the face, lips, or tongue  facial flushing  muscle weakness  signs and symptoms of low blood pressure like dizziness; feeling faint or lightheaded, falls; unusually weak or tired  signs and symptoms of a dangerous change in heartbeat or heart rhythm like chest pain; dizziness; fast or irregular heartbeat; palpitations; breathing problems  sweating This list may not describe all possible side effects. Call your doctor for medical advice about side effects. You may report side effects to FDA at 1-800-FDA-1088. Where should I keep my medicine? This drug is given in a hospital or clinic and will not be stored at home. NOTE: This sheet is a summary. It may not cover all possible information. If you have questions about this medicine, talk to your doctor, pharmacist, or health care provider.  2020 Elsevier/Gold Standard (2016-01-03 12:31:42)  

## 2019-06-16 DIAGNOSIS — N3941 Urge incontinence: Secondary | ICD-10-CM | POA: Diagnosis not present

## 2019-06-17 ENCOUNTER — Inpatient Hospital Stay: Payer: Medicare PPO

## 2019-06-17 ENCOUNTER — Other Ambulatory Visit: Payer: Self-pay

## 2019-06-17 DIAGNOSIS — M879 Osteonecrosis, unspecified: Secondary | ICD-10-CM | POA: Diagnosis not present

## 2019-06-17 DIAGNOSIS — G8929 Other chronic pain: Secondary | ICD-10-CM | POA: Diagnosis not present

## 2019-06-17 DIAGNOSIS — C9001 Multiple myeloma in remission: Secondary | ICD-10-CM

## 2019-06-17 DIAGNOSIS — N183 Chronic kidney disease, stage 3 unspecified: Secondary | ICD-10-CM | POA: Diagnosis not present

## 2019-06-17 DIAGNOSIS — C9000 Multiple myeloma not having achieved remission: Secondary | ICD-10-CM | POA: Diagnosis not present

## 2019-06-17 DIAGNOSIS — D649 Anemia, unspecified: Secondary | ICD-10-CM | POA: Diagnosis not present

## 2019-06-17 DIAGNOSIS — M549 Dorsalgia, unspecified: Secondary | ICD-10-CM | POA: Diagnosis not present

## 2019-06-17 DIAGNOSIS — R208 Other disturbances of skin sensation: Secondary | ICD-10-CM | POA: Diagnosis not present

## 2019-06-17 DIAGNOSIS — G629 Polyneuropathy, unspecified: Secondary | ICD-10-CM | POA: Diagnosis not present

## 2019-06-17 LAB — MAGNESIUM: Magnesium: 1 mg/dL — ABNORMAL LOW (ref 1.7–2.4)

## 2019-06-17 MED ORDER — HEPARIN SOD (PORK) LOCK FLUSH 100 UNIT/ML IV SOLN
INTRAVENOUS | Status: AC
  Filled 2019-06-17: qty 5

## 2019-06-17 MED ORDER — HEPARIN SOD (PORK) LOCK FLUSH 100 UNIT/ML IV SOLN
500.0000 [IU] | Freq: Once | INTRAVENOUS | Status: AC
  Administered 2019-06-17: 12:00:00 500 [IU] via INTRAVENOUS
  Filled 2019-06-17: qty 5

## 2019-06-17 MED ORDER — SODIUM CHLORIDE 0.9% FLUSH
10.0000 mL | INTRAVENOUS | Status: DC | PRN
  Administered 2019-06-17: 10 mL via INTRAVENOUS
  Filled 2019-06-17: qty 10

## 2019-06-17 MED ORDER — SODIUM CHLORIDE 0.9 % IV SOLN
Freq: Once | INTRAVENOUS | Status: AC
  Filled 2019-06-17: qty 250

## 2019-06-17 MED ORDER — MAGNESIUM SULFATE 4 GM/100ML IV SOLN
4.0000 g | Freq: Once | INTRAVENOUS | Status: AC
  Administered 2019-06-17: 4 g via INTRAVENOUS

## 2019-06-17 NOTE — Progress Notes (Signed)
Patient had interstem surgery for her bladder spasms yesterday. Having pain and numbness in low back at incision site. "Groggy" feeling per patient due to taking muscle relaxer this morning

## 2019-06-22 ENCOUNTER — Other Ambulatory Visit: Payer: Self-pay

## 2019-06-23 ENCOUNTER — Inpatient Hospital Stay: Payer: Medicare PPO

## 2019-06-23 DIAGNOSIS — R208 Other disturbances of skin sensation: Secondary | ICD-10-CM | POA: Diagnosis not present

## 2019-06-23 DIAGNOSIS — G8929 Other chronic pain: Secondary | ICD-10-CM | POA: Diagnosis not present

## 2019-06-23 DIAGNOSIS — C9000 Multiple myeloma not having achieved remission: Secondary | ICD-10-CM

## 2019-06-23 DIAGNOSIS — N183 Chronic kidney disease, stage 3 unspecified: Secondary | ICD-10-CM | POA: Diagnosis not present

## 2019-06-23 DIAGNOSIS — M549 Dorsalgia, unspecified: Secondary | ICD-10-CM | POA: Diagnosis not present

## 2019-06-23 DIAGNOSIS — D649 Anemia, unspecified: Secondary | ICD-10-CM | POA: Diagnosis not present

## 2019-06-23 DIAGNOSIS — M879 Osteonecrosis, unspecified: Secondary | ICD-10-CM | POA: Diagnosis not present

## 2019-06-23 DIAGNOSIS — G629 Polyneuropathy, unspecified: Secondary | ICD-10-CM | POA: Diagnosis not present

## 2019-06-23 DIAGNOSIS — C9001 Multiple myeloma in remission: Secondary | ICD-10-CM

## 2019-06-23 LAB — MAGNESIUM: Magnesium: 1.1 mg/dL — ABNORMAL LOW (ref 1.7–2.4)

## 2019-06-23 MED ORDER — HEPARIN SOD (PORK) LOCK FLUSH 100 UNIT/ML IV SOLN
500.0000 [IU] | Freq: Once | INTRAVENOUS | Status: AC
  Administered 2019-06-23: 500 [IU] via INTRAVENOUS
  Filled 2019-06-23: qty 5

## 2019-06-23 MED ORDER — MAGNESIUM SULFATE 4 GM/100ML IV SOLN
4.0000 g | Freq: Once | INTRAVENOUS | Status: AC
  Administered 2019-06-23: 4 g via INTRAVENOUS

## 2019-06-23 MED ORDER — SODIUM CHLORIDE 0.9 % IV SOLN
Freq: Once | INTRAVENOUS | Status: AC
  Filled 2019-06-23: qty 250

## 2019-06-23 MED ORDER — SODIUM CHLORIDE 0.9% FLUSH
10.0000 mL | INTRAVENOUS | Status: DC | PRN
  Administered 2019-06-23 (×2): 10 mL via INTRAVENOUS
  Filled 2019-06-23: qty 10

## 2019-06-23 NOTE — Patient Instructions (Signed)
Magnesium Sulfate injection What is this medicine? MAGNESIUM SULFATE (mag NEE zee um SUL fate) is an electrolyte injection commonly used to treat low magnesium levels in your blood. It is also used to prevent or control seizures in women with preeclampsia or eclampsia. This medicine may be used for other purposes; ask your health care provider or pharmacist if you have questions. What should I tell my health care provider before I take this medicine? They need to know if you have any of these conditions:  heart disease  history of irregular heart beat  kidney disease  an unusual or allergic reaction to magnesium sulfate, medicines, foods, dyes, or preservatives  pregnant or trying to get pregnant  breast-feeding How should I use this medicine? This medicine is for infusion into a vein. It is given by a health care professional in a hospital or clinic setting. Talk to your pediatrician regarding the use of this medicine in children. While this drug may be prescribed for selected conditions, precautions do apply. Overdosage: If you think you have taken too much of this medicine contact a poison control center or emergency room at once. NOTE: This medicine is only for you. Do not share this medicine with others. What if I miss a dose? This does not apply. What may interact with this medicine? This medicine may interact with the following medications:  certain medicines for anxiety or sleep  certain medicines for seizures like phenobarbital  digoxin  medicines that relax muscles for surgery  narcotic medicines for pain This list may not describe all possible interactions. Give your health care provider a list of all the medicines, herbs, non-prescription drugs, or dietary supplements you use. Also tell them if you smoke, drink alcohol, or use illegal drugs. Some items may interact with your medicine. What should I watch for while using this medicine? Your condition will be  monitored carefully while you are receiving this medicine. You may need blood work done while you are receiving this medicine. What side effects may I notice from receiving this medicine? Side effects that you should report to your doctor or health care professional as soon as possible:  allergic reactions like skin rash, itching or hives, swelling of the face, lips, or tongue  facial flushing  muscle weakness  signs and symptoms of low blood pressure like dizziness; feeling faint or lightheaded, falls; unusually weak or tired  signs and symptoms of a dangerous change in heartbeat or heart rhythm like chest pain; dizziness; fast or irregular heartbeat; palpitations; breathing problems  sweating This list may not describe all possible side effects. Call your doctor for medical advice about side effects. You may report side effects to FDA at 1-800-FDA-1088. Where should I keep my medicine? This drug is given in a hospital or clinic and will not be stored at home. NOTE: This sheet is a summary. It may not cover all possible information. If you have questions about this medicine, talk to your doctor, pharmacist, or health care provider.  2020 Elsevier/Gold Standard (2016-01-03 12:31:42)  

## 2019-06-24 ENCOUNTER — Inpatient Hospital Stay: Payer: Medicare PPO

## 2019-06-24 LAB — PROTEIN ELECTROPHORESIS, SERUM
A/G Ratio: 1.5 (ref 0.7–1.7)
Albumin ELP: 3.8 g/dL (ref 2.9–4.4)
Alpha-1-Globulin: 0.3 g/dL (ref 0.0–0.4)
Alpha-2-Globulin: 0.8 g/dL (ref 0.4–1.0)
Beta Globulin: 1 g/dL (ref 0.7–1.3)
Gamma Globulin: 0.4 g/dL (ref 0.4–1.8)
Globulin, Total: 2.5 g/dL (ref 2.2–3.9)
Total Protein ELP: 6.3 g/dL (ref 6.0–8.5)

## 2019-06-24 LAB — KAPPA/LAMBDA LIGHT CHAINS
Kappa free light chain: 14.5 mg/L (ref 3.3–19.4)
Kappa, lambda light chain ratio: 0.03 — ABNORMAL LOW (ref 0.26–1.65)
Lambda free light chains: 573.4 mg/L — ABNORMAL HIGH (ref 5.7–26.3)

## 2019-06-29 NOTE — Progress Notes (Signed)
[facility]  [address] [place], [place] [address] Phone: [phone]  Fax: [phone]   Clinic Day:  07/01/2019  Referring physician: Glean Hess, MD  Chief Complaint: Sarah [name] is a 63 y.o. 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    HPI: The patient was last seen in the medical oncology clinic on 05/31/2019. At that time, she noted right sided neck pain which radiates into ear. She denied any fevers or interval infections. Exam revealed tense SCM muscles. Ear exam was unremarkable. Hematocrit 29.2, hemoglobin 9.9, MCV 88.0, platelets 131,000, WBC 5,200. BUN was 35. Creatinine was 1.45. Magnesium 1.0. Patient received IV Mg 4 g and a B-12 injection. She continued on Phenergan 25 mg p.o. BID prn.   She received IV Mg 4 g weekly (06/03/2019 - 06/23/2019).   Magnesium was followed: 1.4 on 06/03/2019, 1.1 on 06/10/2019, 1.0 on 06/17/2019, 1.1 on 06/23/2019.   Labs followed: 06/03/2019: 24 hour urine with kappa free light chains 95.76, lambda free light chains 1,260.71, ratio 0.08.  06/23/2019: M-spike was 0. Kappa free light chains 14.50, lambda free light chains 573.4, ratio 0.03.   During the interim, she felt "ok". She notes having pain at her incision on her lower back (5/10). She notes having an interstim surgery on 06/16/2019 with urologist Dr. Jess Barters in [place], [place]. This procedure was to improve her bladder function. She notes improvement since procedure.   She reports feeling tired all the time. She notes having night sweats. He neck and ear pain have resolved. Her heartburn and nausea are being well managed with medication. She continues to have a poor appetite. Her weight is down 3 pounds. Her joint pain and neuropathy in her hands and feet are the same. She continues to have leg cramps.     Past Medical History:  Diagnosis Date  .  Abnormal stress test 02/14/2016   Overview:  Added automatically from request for surgery [id number]  . Anemia   . Anxiety   . Arthritis   . Bicuspid aortic valve   . CHF (congestive heart failure) ([place])   . CKD (chronic kidney disease) stage 3, GFR 30-59 ml/min   . Depression   . Diabetes mellitus ([place])   . Dizziness   . Fatty liver   . Frequent falls   . GERD (gastroesophageal reflux disease)   . Gout   . Heart murmur   . History of blood transfusion   . History of bone marrow transplant ([place])   . History of uterine fibroid   . Hx of cardiac catheterization 06/05/2016   Overview:  Normal coronaries 2017  . Hypertension   . Hypomagnesemia   . Multiple myeloma ([place])   . Personal history of chemotherapy   . Renal cyst     Past Surgical History:  Procedure Laterality Date  . ABDOMINAL HYSTERECTOMY    . 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 CARDIAC ELECTROPHYSIOLOGY MAPPING AND ABLATION    . CARPAL TUNNEL RELEASE Bilateral   . CHOLECYSTECTOMY  2008  . COLONOSCOPY WITH PROPOFOL N/A 05/07/2017   Procedure: COLONOSCOPY WITH PROPOFOL;  Surgeon: [name], MD;  Location: [facility] ENDOSCOPY;  Service: Gastroenterology;  Laterality: N/A;  . ESOPHAGOGASTRODUODENOSCOPY (EGD) WITH PROPOFOL N/A 05/07/2017   Procedure: ESOPHAGOGASTRODUODENOSCOPY (EGD) WITH PROPOFOL;  Surgeon: [name], MD;  Location: [facility] ENDOSCOPY;  Service: Gastroenterology;  Laterality: N/A;  . FOOT SURGERY Bilateral   .  INCONTINENCE SURGERY  2009  . PARTIAL HYSTERECTOMY  03/1996   fibroids  . PORTA CATH INSERTION N/A 03/10/2019   Procedure: PORTA CATH INSERTION;  Surgeon: Algernon Huxley, MD;  Location: [facility] LAB;  Service: Cardiovascular;  Laterality: N/A;  . TONSILLECTOMY  2007    Family History  Problem Relation Age of Onset  . Colon cancer Father   . Renal Disease Father   . Diabetes Mellitus II Father   . Melanoma Paternal Grandmother   . Breast cancer Maternal Aunt 29  . Anemia Mother   . Heart  disease Mother   . Heart failure Mother   . Renal Disease Mother   . Congestive Heart Failure Mother   . Heart disease Maternal Uncle   . Throat cancer Maternal Uncle   . Lung cancer Maternal Uncle   . Liver disease Maternal Uncle   . Heart failure Maternal Uncle   . Hearing loss Son 61       Suicide     Social History:  reports that she quit smoking about 28 years ago. Her smoking use included cigarettes. She has a 20.00 pack-year smoking history. She has never used smokeless tobacco. She reports previous alcohol use. She reports that she does not use drugs. Patient has not had ETOH in several months. She is on disability. She notes exposure to perchloroethylene [facility]).She lives in [place]. The patient is alone today.  Allergies:  Allergies  Allergen Reactions  . Oxycodone-Acetaminophen Anaphylaxis    Swelling and rash  . Celebrex [Celecoxib] Diarrhea  . Codeine   . Benadryl [Diphenhydramine] Palpitations  . Morphine Itching and Rash  . Ondansetron Diarrhea  . Tylenol [Acetaminophen] Itching and Rash    Current Medications: Current Outpatient Medications  Medication Sig Dispense Refill  . bisoprolol (ZEBETA) 10 MG tablet TAKE 1 TABLET(10 MG) BY MOUTH DAILY 90 tablet 0  . diclofenac sodium (VOLTAREN) 1 % GEL Apply 2 g topically 4 (four) times daily. 100 g 1  . diphenoxylate-atropine (LOMOTIL) 2.5-0.025 MG tablet Take 2 tablets by mouth daily as needed for diarrhea or loose stools. 45 tablet 2  . fentaNYL (DURAGESIC - DOSED MCG/HR) 25 MCG/HR patch Place 25 mcg onto the skin every 3 (three) days.     Marland Kitchen FLUoxetine (PROZAC) 40 MG capsule Take 1 capsule (40 mg total) by mouth daily. 90 capsule 1  . furosemide (LASIX) 20 MG tablet Take 20 mg by mouth as needed.     Marland Kitchen glucose blood (ONE TOUCH ULTRA TEST) test strip     . lisinopril (PRINIVIL,ZESTRIL) 10 MG tablet Take 10 mg by mouth daily.    . Magnesium Bisglycinate (MAG GLYCINATE) 100 MG TABS Take 100 mg by mouth 2 (two) times  daily. 60 tablet 0  . metFORMIN (GLUCOPHAGE-XR) 500 MG 24 hr tablet Take 1 tablet (500 mg total) by mouth daily with breakfast. 90 tablet 1  . Misc Natural Products (OSTEO BI-FLEX ADV TRIPLE ST PO) Take 2 tablets by mouth daily.    . Multiple Vitamins-Minerals (HAIR/SKIN/NAILS/BIOTIN PO) Take 1 capsule by mouth 3 (three) times daily.    . NON FORMULARY     . omeprazole (PRILOSEC) 40 MG capsule Take 1 capsule (40 mg total) by mouth daily. 90 capsule 1  . pravastatin (PRAVACHOL) 20 MG tablet TAKE 1 TABLET BY MOUTH  DAILY 90 tablet 1  . promethazine (PHENERGAN) 25 MG tablet Take 1 tablet (25 mg total) by mouth 2 (two) times daily as needed for nausea or vomiting.  60 tablet 0  . tiZANidine (ZANAFLEX) 4 MG tablet Take 1 tablet (4 mg total) by mouth every 8 (eight) hours as needed for muscle spasms. 270 tablet 1  . traZODone (DESYREL) 100 MG tablet TAKE 1 TABLET AT BEDTIME  FOR  SLEEP 90 tablet 1  . allopurinol (ZYLOPRIM) 100 MG tablet TAKE 1 TABLET(100 MG) BY MOUTH DAILY as needed (Patient not taking: Reported on 04/06/2019) 90 tablet 1  . aspirin 81 MG chewable tablet Chew 1 tablet (81 mg total) by mouth daily. (Patient not taking: Reported on 05/26/2019) 30 tablet 0  . fexofenadine (ALLEGRA) 180 MG tablet TAKE 1 TABLET(180 MG) BY MOUTH DAILY (Patient not taking: Reported on 05/26/2019) 30 tablet 5  . fluticasone (FLONASE) 50 MCG/ACT nasal spray Place 2 sprays into both nostrils daily. (Patient not taking: Reported on 05/26/2019) 16 g 2  . hydrOXYzine (ATARAX/VISTARIL) 10 MG tablet TAKE 1 TABLET(10 MG) BY MOUTH THREE TIMES DAILY AS NEEDED (Patient not taking: Reported on 05/26/2019) 90 tablet 0  . lidocaine-prilocaine (EMLA) cream APPLY TOPICALLY AS NEEDED. (Patient not taking: Reported on 05/26/2019) 30 g 0  . Menaquinone-7 (VITAMIN K2) 100 MCG CAPS Take 1 tablet by mouth daily.     . Omega 3-6-9 Fatty Acids (OMEGA 3-6-9 COMPLEX) CAPS Take by mouth.     No current facility-administered medications for  this visit.   Facility-Administered Medications Ordered in Other Visits  Medication Dose Route Frequency Provider Last Rate Last Admin  . heparin lock flush 100 unit/mL  500 Units Intravenous Once Corcoran, Melissa C, MD      . sodium chloride flush (NS) 0.9 % injection 10 mL  10 mL Intravenous PRN Nolon Stalls C, MD   10 mL at 07/01/19 0913    Review of Systems  Constitutional: Positive for diaphoresis (night sweats), malaise/fatigue and weight loss (3 lbs). Negative for chills and fever.       Feels "ok".  HENT: Negative.  Negative for congestion, hearing loss, nosebleeds, sinus pain and sore throat.        Neck and right ear pain, resolved.  Eyes: Negative.  Negative for blurred vision and double vision.  Respiratory: Negative.  Negative for cough, shortness of breath and wheezing.   Cardiovascular: Negative.  Negative for chest pain, palpitations, orthopnea, leg swelling and PND.  Gastrointestinal: Positive for heartburn and nausea (chronic s/p gallbladder surgery). Negative for abdominal pain, blood in stool, constipation, diarrhea (improved with lactulose), melena and vomiting.       Poor appetite.  Genitourinary: Negative for dysuria, frequency, hematuria and urgency.       Bladder leakage (f/u urology bladder sling).  Bladder spasms; improving. S/p interstim.  Musculoskeletal: Positive for back pain (chronic, spinal stenosis and pinched nerve; lower back (5/10) s/p interstim), joint pain (arthritis) and myalgias (leg cramps, worse at night). Negative for neck pain (3 pinched nerves in neck; resolved).  Skin: Negative.  Negative for rash.  Neurological: Positive for sensory change (neuropathy in hands and feet). Negative for dizziness, speech change, focal weakness, seizures, weakness and headaches.  Endo/Heme/Allergies: Negative.  Does not bruise/bleed easily.       Diabetes.  Psychiatric/Behavioral: Negative.  Negative for depression and memory loss. The patient is not  nervous/anxious and does not have insomnia.   All other systems reviewed and are negative.  Performance status (ECOG):  1 Vitals Blood pressure (!) 126/57, pulse 65, temperature (!) 95.6 F (35.3 C), temperature source Tympanic, weight 151 lb 10.8 oz (68.8 kg), SpO2 100 %.  Physical Exam  Constitutional: She is oriented to person, place, and time. She appears well-developed and well-nourished. No distress.  HENT:  Head: Normocephalic and atraumatic.  Right Ear: Ear canal normal.  Left Ear: Ear canal normal.  Mouth/Throat: Oropharynx is clear and moist. No oropharyngeal exudate.  Curly dark blonde hair pulled up. Mask.  Eyes: Pupils are equal, round, and reactive to light. Conjunctivae and EOM are normal. No scleral icterus.  Glasses.  Neck: No JVD present.  Moderate SCM muscle tightness.  Cardiovascular: Normal rate, regular rhythm and normal heart sounds.  No murmur heard. Pulmonary/Chest: Effort normal and breath sounds normal. No respiratory distress. She has no wheezes. She has no rales.  Abdominal: Soft. Bowel sounds are normal. She exhibits no distension and no mass. There is no abdominal tenderness. There is no rebound and no guarding.  Musculoskeletal:        General: Tenderness (bilateral lower extremities) present. No edema. Normal range of motion.     Cervical back: Normal range of motion and neck supple.  Lymphadenopathy:       Head (right side): No submandibular, no preauricular and no posterior auricular adenopathy present.       Head (left side): No submandibular, no preauricular and no posterior auricular adenopathy present.    She has no cervical adenopathy.    She has no axillary adenopathy.       Right: No supraclavicular adenopathy present.       Left: No supraclavicular adenopathy present.  Neurological: She is alert and oriented to person, place, and time.  Skin: Skin is warm and dry. No rash noted. She is not diaphoretic. No erythema. No pallor.  Well  healed 5 cm incision on lower back s/p interstim.  Psychiatric: She has a normal mood and affect. Her behavior is normal. Judgment and thought content normal.  Nursing note and vitals reviewed.   Infusion on 07/01/2019  Component Date Value Ref Range Status  . WBC 07/01/2019 4.8  4.0 - 10.5 K/uL Final  . RBC 07/01/2019 3.59* 3.87 - 5.11 MIL/uL Final  . Hemoglobin 07/01/2019 10.6* 12.0 - 15.0 g/dL Final  . HCT 07/01/2019 31.3* 36.0 - 46.0 % Final  . MCV 07/01/2019 87.2  80.0 - 100.0 fL Final  . MCH 07/01/2019 29.5  26.0 - 34.0 pg Final  . MCHC 07/01/2019 33.9  30.0 - 36.0 g/dL Final  . RDW 07/01/2019 12.6  11.5 - 15.5 % Final  . Platelets 07/01/2019 123* 150 - 400 K/uL Final  . nRBC 07/01/2019 0.0  0.0 - 0.2 % Final  . Neutrophils Relative % 07/01/2019 64  % Final  . Neutro Abs 07/01/2019 3.0  1.7 - 7.7 K/uL Final  . Lymphocytes Relative 07/01/2019 25  % Final  . Lymphs Abs 07/01/2019 1.2  0.7 - 4.0 K/uL Final  . Monocytes Relative 07/01/2019 7  % Final  . Monocytes Absolute 07/01/2019 0.4  0.1 - 1.0 K/uL Final  . Eosinophils Relative 07/01/2019 4  % Final  . Eosinophils Absolute 07/01/2019 0.2  0.0 - 0.5 K/uL Final  . Basophils Relative 07/01/2019 0  % Final  . Basophils Absolute 07/01/2019 0.0  0.0 - 0.1 K/uL Final  . Immature Granulocytes 07/01/2019 0  % Final  . Abs Immature Granulocytes 07/01/2019 0.02  0.00 - 0.07 K/uL Final   Performed at [facility], [address], [place], [place] [address]    Assessment:  Sarah [name] is a 63 y.o. female with stage III IgA lambda  light chain multiple myeloma s/p autologous stem cell transplant in 06/14/2015 at the [facility]. Bone marrow revealed 80% plasma cells. Lambda free light chains were 1340.  She had nephrotic range proteinuria.  She initially underwent induction with RVD. Revlimid maintenance was discontinued on 01/21/2017 secondary to intolerance.   M-spikehas been followed: 0 on 04/02/2016  -06/23/2019; and 0.2 on 09/02/2017.  Lambda light chainshave been followed: 22.2 (ratio 0.56) on 07/03/2017, 30.8 (ratio 0.78) on 09/02/2017, 36.9 (ratio 0.40) on 10/21/2017, 37.4 (ratio 0.41) on 12/16/2017, 70.7(ratio 0.31) on 02/17/2018, 64.2 (ratio 0.27) on 04/07/2018, 78.9 (ratio 0.18) on 05/26/2018, 128.8 (ratio 0.17) on 08/06/2018, 181.5 (ratio 0.13) on 10/08/2018, 130.9 (ratio 0.13) on 10/20/2018, 160.7 (ratio 0.10)on 12/09/2018, 236.6 (ratio 0.07) on 02/01/2019, 363.6 (ratio 0.04) on 03/22/2019, 404.8 (ratio 0.04) on 04/05/2019, 420.7 (ratio 0.03) on 05/24/2019, and 573.4 (ratio 0.03) on 06/23/2019.  24 hour UPEP on 06/03/2019 revealed kappa free light chains 95.76, lambda free light chains 1,260.71, and ratio 0.08.  Bone surveyon 04/08/2016 and11/28/2018 revealed no definite lytic lesion seen in the visualized skeleton.Bone surveyon 11/19/2018 revealed no suspicious lucent lesionsand no acute bony abnormality.  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 Zometa was discontinued in 01/2017.  She has B12 deficiency. B12 was 254 on 04/09/2017, 295 on 08/20/2018, and 391 on 10/08/2018. Shewas onoral B12.She received B12 monthly (last 05/31/2019).  Folate was 21.6 on 08/20/2018.  Symptomatically, she is fatigued.  She has some night sweats.  Appetite and weight is down.  Exam is stable.  Plan: 1.   Labs today: CBC with diff, CMP, Mg, ferritin, iron studies, retic.   2. Stage III multiple myeloma Clinically, she is doing fair. Sheiss/pautologous stem cell transplant in 06/2015.  She is off maintenance Revlimid secondary to intolerance.  M spike was 0 on 06/23/2019. Review increasing lambda light chains:                         Lambda light chains were236.6(ratio 0.07)on 02/01/2019.                         Lambda light chains were363.6(ratio 0.04)on 03/22/2019.                          Lambda light chains were404.8(ratio 0.04)on 04/05/2019.                         Lambda light chains were 420.07 (ratio 0.03) on 05/24/2019.   Lambda light chains were 573.40 (ratio 0.03) on 06/23/2019.  Bone survey on 11/19/2018 revealed no lytic lesions. 24 hour UPEP with free light chains revealed lambda free light chains 1,260.71, and ratio 0.08.             Discuss restaging studies with PET scan and bone marrow.   Patient in agreement. 3. Normocytic anemia Hematocrit 31.3. Hemoglobin 10.6. MCV87.2.Platelets 123,000. Ferritin 32 with an iron saturation of 17% and TIBC 359 on12/31/2020. B12 248 (low) and folate 18.6 on 02/08/2019.  TSH was normal on02/20/2020  Creatinine 1.14(CrCl46.45m/minute). Retic1.2% on 07/01/2019. Anemia is likely multifactorial with some component of chronic renal disease.                         Possible component of myeloma. Continue to monitor. 4. Stage III chronic kidney disease BUN24. Creatinine1.14today. Creatinine has fluctuated between 1.07-1.66  in the past 6 months. 24 hour urine with increased free light chains.             She isfollowed by nephrology. 5. Hypomagnesemia, chronic Magnesium1.2today. Magnesium has fluctuated between 1.0-1.4 in the past 2 months. Magnesiumsulfate 4 gm IV today.              RTC weekly for labs (Mg) and +/- IV magnesium. 6. Osteonecrosis of the jaw ONJ occurred in 01/2017 secondary to Zometa. She receives no further bisphosphonates. 7. Chronic back pain Etiology has not been felt secondary to multiple myeloma. [facility]is [facility] and is on Fentanyl patches.             PET scan as above for evaulation. 8. Grade  I-II peripheral neuropathy Etiology felt secondary to prior chemotherapy. Continue to monitor. 9.Chronic nausea Patient is followed by Dr. Vicente Males. Patient continues Phenergan 25 mg p.o.BIDbefore mealsprn. 10.   B12 deficiency             B12 was 391 on 10/08/2018.             She was on oral B12.             B12 injections began on 02/15/2019 (last 05/31/2019).             B12 today and monthly x 6.             Folate was 18.6 on 02/08/2019.             Follow folate yearly. 11.   Schedule PET scan. 12.   Schedule bone marrow aspirate and biopsy. 13.   RTC after PET scan and 1 week after bone marrow for MD assessment and discussion regarding direction of therapy.  I discussed the assessment and treatment plan with the patient.  The patient was provided an opportunity to ask questions and all were answered.  The patient agreed with the plan and demonstrated an understanding of the instructions.  The patient was advised to call back if the symptoms worsen or if the condition fails to improve as anticipated.   Lequita Asal, MD, PhD    07/01/2019, 9:35 AM  I, Selena Batten, am acting as scribe for [employer]. Mike Gip, MD, PhD.  I, Melissa C. Mike Gip, MD, have reviewed the above documentation for accuracy and completeness, and I agree with the above.

## 2019-06-30 ENCOUNTER — Other Ambulatory Visit: Payer: Self-pay | Admitting: *Deleted

## 2019-06-30 DIAGNOSIS — C9000 Multiple myeloma not having achieved remission: Secondary | ICD-10-CM

## 2019-07-01 ENCOUNTER — Inpatient Hospital Stay: Payer: Medicare PPO

## 2019-07-01 ENCOUNTER — Encounter: Payer: Self-pay | Admitting: Hematology and Oncology

## 2019-07-01 ENCOUNTER — Other Ambulatory Visit: Payer: Self-pay

## 2019-07-01 ENCOUNTER — Inpatient Hospital Stay: Payer: Medicare PPO | Admitting: Hematology and Oncology

## 2019-07-01 ENCOUNTER — Other Ambulatory Visit: Payer: Self-pay | Admitting: Hematology and Oncology

## 2019-07-01 ENCOUNTER — Telehealth: Payer: Self-pay

## 2019-07-01 VITALS — BP 126/57 | HR 65 | Temp 95.6°F | Wt 151.7 lb

## 2019-07-01 DIAGNOSIS — N183 Chronic kidney disease, stage 3 unspecified: Secondary | ICD-10-CM | POA: Diagnosis not present

## 2019-07-01 DIAGNOSIS — R110 Nausea: Secondary | ICD-10-CM

## 2019-07-01 DIAGNOSIS — D649 Anemia, unspecified: Secondary | ICD-10-CM

## 2019-07-01 DIAGNOSIS — C9001 Multiple myeloma in remission: Secondary | ICD-10-CM | POA: Diagnosis not present

## 2019-07-01 DIAGNOSIS — E612 Magnesium deficiency: Secondary | ICD-10-CM

## 2019-07-01 DIAGNOSIS — C9000 Multiple myeloma not having achieved remission: Secondary | ICD-10-CM | POA: Diagnosis not present

## 2019-07-01 DIAGNOSIS — R208 Other disturbances of skin sensation: Secondary | ICD-10-CM | POA: Diagnosis not present

## 2019-07-01 DIAGNOSIS — E538 Deficiency of other specified B group vitamins: Secondary | ICD-10-CM

## 2019-07-01 DIAGNOSIS — M549 Dorsalgia, unspecified: Secondary | ICD-10-CM | POA: Diagnosis not present

## 2019-07-01 DIAGNOSIS — M879 Osteonecrosis, unspecified: Secondary | ICD-10-CM | POA: Diagnosis not present

## 2019-07-01 DIAGNOSIS — G629 Polyneuropathy, unspecified: Secondary | ICD-10-CM | POA: Diagnosis not present

## 2019-07-01 DIAGNOSIS — G8929 Other chronic pain: Secondary | ICD-10-CM | POA: Diagnosis not present

## 2019-07-01 DIAGNOSIS — Z7189 Other specified counseling: Secondary | ICD-10-CM

## 2019-07-01 LAB — RETICULOCYTES
Immature Retic Fract: 11.4 % (ref 2.3–15.9)
RBC.: 3.7 MIL/uL — ABNORMAL LOW (ref 3.87–5.11)
Retic Count, Absolute: 43.7 10*3/uL (ref 19.0–186.0)
Retic Ct Pct: 1.2 % (ref 0.4–3.1)

## 2019-07-01 LAB — COMPREHENSIVE METABOLIC PANEL
ALT: 20 U/L (ref 0–44)
AST: 26 U/L (ref 15–41)
Albumin: 4.3 g/dL (ref 3.5–5.0)
Alkaline Phosphatase: 80 U/L (ref 38–126)
Anion gap: 9 (ref 5–15)
BUN: 24 mg/dL — ABNORMAL HIGH (ref 8–23)
CO2: 23 mmol/L (ref 22–32)
Calcium: 9.2 mg/dL (ref 8.9–10.3)
Chloride: 106 mmol/L (ref 98–111)
Creatinine, Ser: 1.14 mg/dL — ABNORMAL HIGH (ref 0.44–1.00)
GFR calc Af Amer: 60 mL/min — ABNORMAL LOW (ref >60)
GFR calc non Af Amer: 51 mL/min — ABNORMAL LOW (ref >60)
Glucose, Bld: 133 mg/dL — ABNORMAL HIGH (ref 70–99)
Potassium: 4.6 mmol/L (ref 3.5–5.1)
Sodium: 138 mmol/L (ref 135–145)
Total Bilirubin: 0.4 mg/dL (ref 0.3–1.2)
Total Protein: 6.8 g/dL (ref 6.5–8.1)

## 2019-07-01 LAB — CBC WITH DIFFERENTIAL/PLATELET
Abs Immature Granulocytes: 0.02 10*3/uL (ref 0.00–0.07)
Basophils Absolute: 0 10*3/uL (ref 0.0–0.1)
Basophils Relative: 0 %
Eosinophils Absolute: 0.2 10*3/uL (ref 0.0–0.5)
Eosinophils Relative: 4 %
HCT: 31.3 % — ABNORMAL LOW (ref 36.0–46.0)
Hemoglobin: 10.6 g/dL — ABNORMAL LOW (ref 12.0–15.0)
Immature Granulocytes: 0 %
Lymphocytes Relative: 25 %
Lymphs Abs: 1.2 10*3/uL (ref 0.7–4.0)
MCH: 29.5 pg (ref 26.0–34.0)
MCHC: 33.9 g/dL (ref 30.0–36.0)
MCV: 87.2 fL (ref 80.0–100.0)
Monocytes Absolute: 0.4 10*3/uL (ref 0.1–1.0)
Monocytes Relative: 7 %
Neutro Abs: 3 10*3/uL (ref 1.7–7.7)
Neutrophils Relative %: 64 %
Platelets: 123 10*3/uL — ABNORMAL LOW (ref 150–400)
RBC: 3.59 MIL/uL — ABNORMAL LOW (ref 3.87–5.11)
RDW: 12.6 % (ref 11.5–15.5)
WBC: 4.8 10*3/uL (ref 4.0–10.5)
nRBC: 0 % (ref 0.0–0.2)

## 2019-07-01 LAB — IRON AND TIBC
Iron: 60 ug/dL (ref 28–170)
Saturation Ratios: 17 % (ref 10.4–31.8)
TIBC: 359 ug/dL (ref 250–450)
UIBC: 299 ug/dL

## 2019-07-01 LAB — FERRITIN: Ferritin: 32 ng/mL (ref 11–307)

## 2019-07-01 LAB — MAGNESIUM: Magnesium: 1.2 mg/dL — ABNORMAL LOW (ref 1.7–2.4)

## 2019-07-01 MED ORDER — CYANOCOBALAMIN 1000 MCG/ML IJ SOLN
1000.0000 ug | Freq: Once | INTRAMUSCULAR | Status: AC
  Administered 2019-07-01: 13:00:00 1000 ug via INTRAMUSCULAR

## 2019-07-01 MED ORDER — MAGNESIUM SULFATE 4 GM/100ML IV SOLN
4.0000 g | Freq: Once | INTRAVENOUS | Status: AC
  Administered 2019-07-01: 4 g via INTRAVENOUS

## 2019-07-01 MED ORDER — SODIUM CHLORIDE 0.9% FLUSH
10.0000 mL | INTRAVENOUS | Status: DC | PRN
  Administered 2019-07-01: 10 mL via INTRAVENOUS
  Filled 2019-07-01: qty 10

## 2019-07-01 MED ORDER — HEPARIN SOD (PORK) LOCK FLUSH 100 UNIT/ML IV SOLN
500.0000 [IU] | Freq: Once | INTRAVENOUS | Status: AC
  Administered 2019-07-01: 13:00:00 500 [IU] via INTRAVENOUS
  Filled 2019-07-01: qty 5

## 2019-07-01 MED ORDER — SODIUM CHLORIDE 0.9 % IV SOLN
Freq: Once | INTRAVENOUS | Status: AC
  Filled 2019-07-01: qty 250

## 2019-07-01 NOTE — Telephone Encounter (Signed)
faxed bone marrow orders to scheduling. fax conformation confirmed.

## 2019-07-01 NOTE — Patient Instructions (Signed)
Cyanocobalamin, Vitamin B12 injection What is this medicine? CYANOCOBALAMIN (sye an oh koe BAL a min) is a man made form of vitamin B12. Vitamin B12 is used in the growth of healthy blood cells, nerve cells, and proteins in the body. It also helps with the metabolism of fats and carbohydrates. This medicine is used to treat people who can not absorb vitamin B12. This medicine may be used for other purposes; ask your health care provider or pharmacist if you have questions. COMMON BRAND NAME(S): B-12 Compliance Kit, B-12 Injection Kit, Cyomin, LA-12, Nutri-Twelve, Physicians EZ Use B-12, Primabalt What should I tell my health care provider before I take this medicine? They need to know if you have any of these conditions:  kidney disease  Leber's disease  megaloblastic anemia  an unusual or allergic reaction to cyanocobalamin, cobalt, other medicines, foods, dyes, or preservatives  pregnant or trying to get pregnant  breast-feeding How should I use this medicine? This medicine is injected into a muscle or deeply under the skin. It is usually given by a health care professional in a clinic or doctor's office. However, your doctor may teach you how to inject yourself. Follow all instructions. Talk to your pediatrician regarding the use of this medicine in children. Special care may be needed. Overdosage: If you think you have taken too much of this medicine contact a poison control center or emergency room at once. NOTE: This medicine is only for you. Do not share this medicine with others. What if I miss a dose? If you are given your dose at a clinic or doctor's office, call to reschedule your appointment. If you give your own injections and you miss a dose, take it as soon as you can. If it is almost time for your next dose, take only that dose. Do not take double or extra doses. What may interact with this medicine?  colchicine  heavy alcohol intake This list may not describe all  possible interactions. Give your health care provider a list of all the medicines, herbs, non-prescription drugs, or dietary supplements you use. Also tell them if you smoke, drink alcohol, or use illegal drugs. Some items may interact with your medicine. What should I watch for while using this medicine? Visit your doctor or health care professional regularly. You may need blood work done while you are taking this medicine. You may need to follow a special diet. Talk to your doctor. Limit your alcohol intake and avoid smoking to get the best benefit. What side effects may I notice from receiving this medicine? Side effects that you should report to your doctor or health care professional as soon as possible:  allergic reactions like skin rash, itching or hives, swelling of the face, lips, or tongue  blue tint to skin  chest tightness, pain  difficulty breathing, wheezing  dizziness  red, swollen painful area on the leg Side effects that usually do not require medical attention (report to your doctor or health care professional if they continue or are bothersome):  diarrhea  headache This list may not describe all possible side effects. Call your doctor for medical advice about side effects. You may report side effects to FDA at 1-800-FDA-1088. Where should I keep my medicine? Keep out of the reach of children. Store at room temperature between 15 and 30 degrees C (59 and 85 degrees F). Protect from light. Throw away any unused medicine after the expiration date. NOTE: This sheet is a summary. It may not cover  all possible information. If you have questions about this medicine, talk to your doctor, pharmacist, or health care provider.  2020 Elsevier/Gold Standard (2007-09-28 22:10:20) Magnesium Sulfate injection What is this medicine? MAGNESIUM SULFATE (mag NEE zee um SUL fate) is an electrolyte injection commonly used to treat low magnesium levels in your blood. It is also used to  prevent or control seizures in women with preeclampsia or eclampsia. This medicine may be used for other purposes; ask your health care provider or pharmacist if you have questions. What should I tell my health care provider before I take this medicine? They need to know if you have any of these conditions:  heart disease  history of irregular heart beat  kidney disease  an unusual or allergic reaction to magnesium sulfate, medicines, foods, dyes, or preservatives  pregnant or trying to get pregnant  breast-feeding How should I use this medicine? This medicine is for infusion into a vein. It is given by a health care professional in a hospital or clinic setting. Talk to your pediatrician regarding the use of this medicine in children. While this drug may be prescribed for selected conditions, precautions do apply. Overdosage: If you think you have taken too much of this medicine contact a poison control center or emergency room at once. NOTE: This medicine is only for you. Do not share this medicine with others. What if I miss a dose? This does not apply. What may interact with this medicine? This medicine may interact with the following medications:  certain medicines for anxiety or sleep  certain medicines for seizures like phenobarbital  digoxin  medicines that relax muscles for surgery  narcotic medicines for pain This list may not describe all possible interactions. Give your health care provider a list of all the medicines, herbs, non-prescription drugs, or dietary supplements you use. Also tell them if you smoke, drink alcohol, or use illegal drugs. Some items may interact with your medicine. What should I watch for while using this medicine? Your condition will be monitored carefully while you are receiving this medicine. You may need blood work done while you are receiving this medicine. What side effects may I notice from receiving this medicine? Side effects that you  should report to your doctor or health care professional as soon as possible:  allergic reactions like skin rash, itching or hives, swelling of the face, lips, or tongue  facial flushing  muscle weakness  signs and symptoms of low blood pressure like dizziness; feeling faint or lightheaded, falls; unusually weak or tired  signs and symptoms of a dangerous change in heartbeat or heart rhythm like chest pain; dizziness; fast or irregular heartbeat; palpitations; breathing problems  sweating This list may not describe all possible side effects. Call your doctor for medical advice about side effects. You may report side effects to FDA at 1-800-FDA-1088. Where should I keep my medicine? This drug is given in a hospital or clinic and will not be stored at home. NOTE: This sheet is a summary. It may not cover all possible information. If you have questions about this medicine, talk to your doctor, pharmacist, or health care provider.  2020 Elsevier/Gold Standard (2016-01-03 12:31:42)

## 2019-07-01 NOTE — Progress Notes (Signed)
The patient c/o having pain level 5 noted to incision site to her lower back.

## 2019-07-03 ENCOUNTER — Other Ambulatory Visit: Payer: Self-pay | Admitting: Internal Medicine

## 2019-07-03 DIAGNOSIS — F324 Major depressive disorder, single episode, in partial remission: Secondary | ICD-10-CM

## 2019-07-06 DIAGNOSIS — M48061 Spinal stenosis, lumbar region without neurogenic claudication: Secondary | ICD-10-CM | POA: Diagnosis not present

## 2019-07-06 DIAGNOSIS — M47816 Spondylosis without myelopathy or radiculopathy, lumbar region: Secondary | ICD-10-CM | POA: Diagnosis not present

## 2019-07-06 DIAGNOSIS — Z7982 Long term (current) use of aspirin: Secondary | ICD-10-CM | POA: Diagnosis not present

## 2019-07-06 DIAGNOSIS — E785 Hyperlipidemia, unspecified: Secondary | ICD-10-CM | POA: Diagnosis not present

## 2019-07-06 DIAGNOSIS — Z885 Allergy status to narcotic agent status: Secondary | ICD-10-CM | POA: Diagnosis not present

## 2019-07-06 DIAGNOSIS — Z7984 Long term (current) use of oral hypoglycemic drugs: Secondary | ICD-10-CM | POA: Diagnosis not present

## 2019-07-06 DIAGNOSIS — M47817 Spondylosis without myelopathy or radiculopathy, lumbosacral region: Secondary | ICD-10-CM | POA: Diagnosis not present

## 2019-07-06 DIAGNOSIS — Z87891 Personal history of nicotine dependence: Secondary | ICD-10-CM | POA: Diagnosis not present

## 2019-07-06 DIAGNOSIS — Z79899 Other long term (current) drug therapy: Secondary | ICD-10-CM | POA: Diagnosis not present

## 2019-07-07 ENCOUNTER — Other Ambulatory Visit: Payer: Self-pay

## 2019-07-08 ENCOUNTER — Inpatient Hospital Stay: Payer: Medicare PPO

## 2019-07-08 ENCOUNTER — Inpatient Hospital Stay: Payer: Medicare PPO | Attending: Hematology and Oncology

## 2019-07-08 ENCOUNTER — Ambulatory Visit: Payer: Medicare PPO | Admitting: Hematology and Oncology

## 2019-07-08 DIAGNOSIS — E538 Deficiency of other specified B group vitamins: Secondary | ICD-10-CM | POA: Insufficient documentation

## 2019-07-08 DIAGNOSIS — Z9484 Stem cells transplant status: Secondary | ICD-10-CM | POA: Insufficient documentation

## 2019-07-08 DIAGNOSIS — R61 Generalized hyperhidrosis: Secondary | ICD-10-CM | POA: Diagnosis not present

## 2019-07-08 DIAGNOSIS — Z9481 Bone marrow transplant status: Secondary | ICD-10-CM | POA: Insufficient documentation

## 2019-07-08 DIAGNOSIS — M879 Osteonecrosis, unspecified: Secondary | ICD-10-CM | POA: Diagnosis not present

## 2019-07-08 DIAGNOSIS — N183 Chronic kidney disease, stage 3 unspecified: Secondary | ICD-10-CM | POA: Diagnosis not present

## 2019-07-08 DIAGNOSIS — M1990 Unspecified osteoarthritis, unspecified site: Secondary | ICD-10-CM | POA: Insufficient documentation

## 2019-07-08 DIAGNOSIS — T40605A Adverse effect of unspecified narcotics, initial encounter: Secondary | ICD-10-CM | POA: Diagnosis not present

## 2019-07-08 DIAGNOSIS — G8929 Other chronic pain: Secondary | ICD-10-CM | POA: Diagnosis not present

## 2019-07-08 DIAGNOSIS — Z833 Family history of diabetes mellitus: Secondary | ICD-10-CM | POA: Insufficient documentation

## 2019-07-08 DIAGNOSIS — K5903 Drug induced constipation: Secondary | ICD-10-CM | POA: Insufficient documentation

## 2019-07-08 DIAGNOSIS — Z8379 Family history of other diseases of the digestive system: Secondary | ICD-10-CM | POA: Insufficient documentation

## 2019-07-08 DIAGNOSIS — Z808 Family history of malignant neoplasm of other organs or systems: Secondary | ICD-10-CM | POA: Insufficient documentation

## 2019-07-08 DIAGNOSIS — Z7984 Long term (current) use of oral hypoglycemic drugs: Secondary | ICD-10-CM | POA: Diagnosis not present

## 2019-07-08 DIAGNOSIS — G2581 Restless legs syndrome: Secondary | ICD-10-CM | POA: Insufficient documentation

## 2019-07-08 DIAGNOSIS — Z801 Family history of malignant neoplasm of trachea, bronchus and lung: Secondary | ICD-10-CM | POA: Insufficient documentation

## 2019-07-08 DIAGNOSIS — R197 Diarrhea, unspecified: Secondary | ICD-10-CM | POA: Diagnosis not present

## 2019-07-08 DIAGNOSIS — I130 Hypertensive heart and chronic kidney disease with heart failure and stage 1 through stage 4 chronic kidney disease, or unspecified chronic kidney disease: Secondary | ICD-10-CM | POA: Diagnosis not present

## 2019-07-08 DIAGNOSIS — G4700 Insomnia, unspecified: Secondary | ICD-10-CM | POA: Diagnosis not present

## 2019-07-08 DIAGNOSIS — C9000 Multiple myeloma not having achieved remission: Secondary | ICD-10-CM | POA: Insufficient documentation

## 2019-07-08 DIAGNOSIS — G629 Polyneuropathy, unspecified: Secondary | ICD-10-CM | POA: Diagnosis not present

## 2019-07-08 DIAGNOSIS — F329 Major depressive disorder, single episode, unspecified: Secondary | ICD-10-CM | POA: Diagnosis not present

## 2019-07-08 DIAGNOSIS — R809 Proteinuria, unspecified: Secondary | ICD-10-CM | POA: Insufficient documentation

## 2019-07-08 DIAGNOSIS — Z885 Allergy status to narcotic agent status: Secondary | ICD-10-CM | POA: Insufficient documentation

## 2019-07-08 DIAGNOSIS — D649 Anemia, unspecified: Secondary | ICD-10-CM | POA: Insufficient documentation

## 2019-07-08 DIAGNOSIS — R12 Heartburn: Secondary | ICD-10-CM | POA: Diagnosis not present

## 2019-07-08 DIAGNOSIS — R110 Nausea: Secondary | ICD-10-CM | POA: Insufficient documentation

## 2019-07-08 DIAGNOSIS — Z79899 Other long term (current) drug therapy: Secondary | ICD-10-CM | POA: Insufficient documentation

## 2019-07-08 DIAGNOSIS — Z886 Allergy status to analgesic agent status: Secondary | ICD-10-CM | POA: Insufficient documentation

## 2019-07-08 DIAGNOSIS — E1122 Type 2 diabetes mellitus with diabetic chronic kidney disease: Secondary | ICD-10-CM | POA: Diagnosis not present

## 2019-07-08 DIAGNOSIS — Z8249 Family history of ischemic heart disease and other diseases of the circulatory system: Secondary | ICD-10-CM | POA: Insufficient documentation

## 2019-07-08 DIAGNOSIS — C9001 Multiple myeloma in remission: Secondary | ICD-10-CM

## 2019-07-08 DIAGNOSIS — Z800 Family history of malignant neoplasm of digestive organs: Secondary | ICD-10-CM | POA: Insufficient documentation

## 2019-07-08 DIAGNOSIS — Z87891 Personal history of nicotine dependence: Secondary | ICD-10-CM | POA: Insufficient documentation

## 2019-07-08 DIAGNOSIS — M549 Dorsalgia, unspecified: Secondary | ICD-10-CM | POA: Diagnosis not present

## 2019-07-08 DIAGNOSIS — Z803 Family history of malignant neoplasm of breast: Secondary | ICD-10-CM | POA: Insufficient documentation

## 2019-07-08 DIAGNOSIS — R5383 Other fatigue: Secondary | ICD-10-CM | POA: Insufficient documentation

## 2019-07-08 LAB — MAGNESIUM: Magnesium: 1.1 mg/dL — ABNORMAL LOW (ref 1.7–2.4)

## 2019-07-08 MED ORDER — HEPARIN SOD (PORK) LOCK FLUSH 100 UNIT/ML IV SOLN
500.0000 [IU] | Freq: Once | INTRAVENOUS | Status: AC
  Administered 2019-07-08: 500 [IU] via INTRAVENOUS
  Filled 2019-07-08: qty 5

## 2019-07-08 MED ORDER — SODIUM CHLORIDE 0.9% FLUSH
10.0000 mL | INTRAVENOUS | Status: DC | PRN
  Administered 2019-07-08: 09:00:00 10 mL via INTRAVENOUS
  Filled 2019-07-08: qty 10

## 2019-07-08 MED ORDER — SODIUM CHLORIDE 0.9 % IV SOLN
Freq: Once | INTRAVENOUS | Status: AC
  Filled 2019-07-08: qty 250

## 2019-07-08 MED ORDER — MAGNESIUM SULFATE 4 GM/100ML IV SOLN
4.0000 g | Freq: Once | INTRAVENOUS | Status: AC
  Administered 2019-07-08: 4 g via INTRAVENOUS
  Filled 2019-07-08: qty 100

## 2019-07-08 NOTE — Patient Instructions (Signed)
Magnesium Sulfate injection What is this medicine? MAGNESIUM SULFATE (mag NEE zee um SUL fate) is an electrolyte injection commonly used to treat low magnesium levels in your blood. It is also used to prevent or control seizures in women with preeclampsia or eclampsia. This medicine may be used for other purposes; ask your health care provider or pharmacist if you have questions. What should I tell my health care provider before I take this medicine? They need to know if you have any of these conditions:  heart disease  history of irregular heart beat  kidney disease  an unusual or allergic reaction to magnesium sulfate, medicines, foods, dyes, or preservatives  pregnant or trying to get pregnant  breast-feeding How should I use this medicine? This medicine is for infusion into a vein. It is given by a health care professional in a hospital or clinic setting. Talk to your pediatrician regarding the use of this medicine in children. While this drug may be prescribed for selected conditions, precautions do apply. Overdosage: If you think you have taken too much of this medicine contact a poison control center or emergency room at once. NOTE: This medicine is only for you. Do not share this medicine with others. What if I miss a dose? This does not apply. What may interact with this medicine? This medicine may interact with the following medications:  certain medicines for anxiety or sleep  certain medicines for seizures like phenobarbital  digoxin  medicines that relax muscles for surgery  narcotic medicines for pain This list may not describe all possible interactions. Give your health care provider a list of all the medicines, herbs, non-prescription drugs, or dietary supplements you use. Also tell them if you smoke, drink alcohol, or use illegal drugs. Some items may interact with your medicine. What should I watch for while using this medicine? Your condition will be  monitored carefully while you are receiving this medicine. You may need blood work done while you are receiving this medicine. What side effects may I notice from receiving this medicine? Side effects that you should report to your doctor or health care professional as soon as possible:  allergic reactions like skin rash, itching or hives, swelling of the face, lips, or tongue  facial flushing  muscle weakness  signs and symptoms of low blood pressure like dizziness; feeling faint or lightheaded, falls; unusually weak or tired  signs and symptoms of a dangerous change in heartbeat or heart rhythm like chest pain; dizziness; fast or irregular heartbeat; palpitations; breathing problems  sweating This list may not describe all possible side effects. Call your doctor for medical advice about side effects. You may report side effects to FDA at 1-800-FDA-1088. Where should I keep my medicine? This drug is given in a hospital or clinic and will not be stored at home. NOTE: This sheet is a summary. It may not cover all possible information. If you have questions about this medicine, talk to your doctor, pharmacist, or health care provider.  2020 Elsevier/Gold Standard (2016-01-03 12:31:42)  

## 2019-07-12 ENCOUNTER — Other Ambulatory Visit: Payer: Self-pay

## 2019-07-12 ENCOUNTER — Encounter
Admission: RE | Admit: 2019-07-12 | Discharge: 2019-07-12 | Disposition: A | Discharge status: Home / Self Care | Payer: Medicare PPO | Source: Ambulatory Visit | Attending: Hematology and Oncology | Admitting: Hematology and Oncology

## 2019-07-12 DIAGNOSIS — C9001 Multiple myeloma in remission: Secondary | ICD-10-CM | POA: Diagnosis not present

## 2019-07-12 DIAGNOSIS — E1122 Type 2 diabetes mellitus with diabetic chronic kidney disease: Secondary | ICD-10-CM | POA: Diagnosis not present

## 2019-07-12 DIAGNOSIS — I130 Hypertensive heart and chronic kidney disease with heart failure and stage 1 through stage 4 chronic kidney disease, or unspecified chronic kidney disease: Secondary | ICD-10-CM | POA: Insufficient documentation

## 2019-07-12 DIAGNOSIS — Z7984 Long term (current) use of oral hypoglycemic drugs: Secondary | ICD-10-CM | POA: Diagnosis not present

## 2019-07-12 DIAGNOSIS — Z87891 Personal history of nicotine dependence: Secondary | ICD-10-CM | POA: Diagnosis not present

## 2019-07-12 DIAGNOSIS — Z7982 Long term (current) use of aspirin: Secondary | ICD-10-CM | POA: Insufficient documentation

## 2019-07-12 DIAGNOSIS — I509 Heart failure, unspecified: Secondary | ICD-10-CM | POA: Insufficient documentation

## 2019-07-12 DIAGNOSIS — Z79899 Other long term (current) drug therapy: Secondary | ICD-10-CM | POA: Insufficient documentation

## 2019-07-12 DIAGNOSIS — C9000 Multiple myeloma not having achieved remission: Secondary | ICD-10-CM | POA: Diagnosis not present

## 2019-07-12 DIAGNOSIS — N183 Chronic kidney disease, stage 3 unspecified: Secondary | ICD-10-CM | POA: Insufficient documentation

## 2019-07-12 LAB — GLUCOSE, CAPILLARY
Glucose-Capillary: 129 mg/dL — ABNORMAL HIGH (ref 70–99)
Glucose-Capillary: 112 mg/dL — ABNORMAL HIGH (ref 70–99)

## 2019-07-12 MED ORDER — FLUDEOXYGLUCOSE F - 18 (FDG) INJECTION
7.8000 | Freq: Once | INTRAVENOUS | Status: AC | PRN
  Administered 2019-07-12: 7.33 via INTRAVENOUS

## 2019-07-13 ENCOUNTER — Telehealth: Payer: Self-pay

## 2019-07-13 NOTE — Telephone Encounter (Signed)
Spoke with the patient to inform her that she has been schedule for a bone marrow bx January 18 @7 :30 for a 8:30 procedure. I have also informed her that a nurse will call her and go over instruction for the procedure. The patient was understanding and agreeable

## 2019-07-13 NOTE — Progress Notes (Signed)
[facility]  [address] [place], [place] [address] Phone: [phone]  Fax: [phone]   Clinic Day:  07/15/2019  Referring physician: Glean Hess, MD  Chief Complaint: Sarah [name] is a 64 y.o. 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  HPI: The patient was last seen in the medical oncology clinic on 07/01/2019. At that time, she was fatigued. She had some night sweats. Appetite and weight was down. Exam was stable.  CBC included a hematocrit 31.3, hemoglobin 10.6, platelets 123,000, WBC 4,800. Ferritin was 32 with an iron saturation 17% and a TIBC of 359. Creatinine 1.14. Retic was 1.2%. Magnesium was 1.2. She received IV magnesium sulfate 4 gm and a B-12 injection. She continued Phenergan 25 mg p.o. BID before meals prn.   Magnesium was 1.1 on 07/08/2019.  She received magnesium sulfate 4 gm.   PET scan on 07/12/2019 revealed no focal metabolic activity to suggest active myeloma within the skeleton. There were no lytic lesions identified on the CT portion of the exam or soft tissue plasmacytomas. There was no evidence of multiple myeloma.   She is scheduled for bone marrow on 07/19/2019.  During the interim, she has felt "tired". She notes feeling tired all the time. She notes that oral iron give her diarrhea. She notes tolerating IV Venofer and having felt much better afterwards (increase in energy).  She would like IV iron today if she is preauthorized. She denies bleeding of any kind.  Her diet is poor. Her nausea is getting worse with associated heartburn. She has had to increase her nausea medication at night. She is more nuaseated after dinner. She continues to have night sweats. She has insomnia secondary to restless legs and feeling nauseated. Ondansetron gives her diarrhea.    We discussed a scopolamine patch for her nausea.  She  denies having glaucoma.  Her last eye exam was in 2018. She has a sensitive stomach.  She notes urinary improvement with bladder sling.  She is being seen in GI by Dr. Vicente Males.  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  She has tried and failed multiple agents in the past.  She began Amitiza on 03/23/2019 at a low-dose rate that caused severe diarrhea.  She had significant improvement after increasing the dose of lactulose.     Past Medical History:  Diagnosis Date  . Abnormal stress test 02/14/2016   Overview:  Added automatically from request for surgery [id number]  . Anemia   . Anxiety   . Arthritis   . Bicuspid aortic valve   . CHF (congestive heart failure) ([place])   . CKD (chronic kidney disease) stage 3, GFR 30-59 ml/min   . Depression   . Diabetes mellitus ([place])   . Dizziness   . Fatty liver   . Frequent falls   . GERD (gastroesophageal reflux disease)   . Gout   . Heart murmur   . History of blood transfusion   . History of bone marrow transplant ([place])   . History of uterine fibroid   . Hx of cardiac catheterization 06/05/2016   Overview:  Normal coronaries 2017  . Hypertension   . Hypomagnesemia   . Multiple myeloma ([place])   . Personal history of chemotherapy   . Renal cyst     Past Surgical History:  Procedure Laterality Date  . ABDOMINAL HYSTERECTOMY    .  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 CARDIAC ELECTROPHYSIOLOGY MAPPING AND ABLATION    . CARPAL TUNNEL RELEASE Bilateral   . CHOLECYSTECTOMY  2008  . COLONOSCOPY WITH PROPOFOL N/A 05/07/2017   Procedure: COLONOSCOPY WITH PROPOFOL;  Surgeon: [name], MD;  Location: [facility] ENDOSCOPY;  Service: Gastroenterology;  Laterality: N/A;  . ESOPHAGOGASTRODUODENOSCOPY (EGD) WITH PROPOFOL N/A 05/07/2017   Procedure: ESOPHAGOGASTRODUODENOSCOPY (EGD) WITH PROPOFOL;  Surgeon: [name], MD;  Location: [facility] ENDOSCOPY;  Service: Gastroenterology;  Laterality: N/A;  . FOOT  SURGERY Bilateral   . INCONTINENCE SURGERY  2009  . PARTIAL HYSTERECTOMY  03/1996   fibroids  . PORTA CATH INSERTION N/A 03/10/2019   Procedure: PORTA CATH INSERTION;  Surgeon: Algernon Huxley, MD;  Location: [facility] LAB;  Service: Cardiovascular;  Laterality: N/A;  . TONSILLECTOMY  2007    Family History  Problem Relation Age of Onset  . Colon cancer Father   . Renal Disease Father   . Diabetes Mellitus II Father   . Melanoma Paternal Grandmother   . Breast cancer Maternal Aunt 57  . Anemia Mother   . Heart disease Mother   . Heart failure Mother   . Renal Disease Mother   . Congestive Heart Failure Mother   . Heart disease Maternal Uncle   . Throat cancer Maternal Uncle   . Lung cancer Maternal Uncle   . Liver disease Maternal Uncle   . Heart failure Maternal Uncle   . Hearing loss Son 76       Suicide     Social History:  reports that she quit smoking about 28 years ago. Her smoking use included cigarettes. She has a 20.00 pack-year smoking history. She has never used smokeless tobacco. She reports previous alcohol use. She reports that she does not use drugs.  Patient has not had ETOH in several months. She is on disability. She notes exposure to perchloroethylene [facility]).She lives in [place]. The patient is alone today.  Allergies:  Allergies  Allergen Reactions  . Oxycodone-Acetaminophen Anaphylaxis    Swelling and rash  . Celebrex [Celecoxib] Diarrhea  . Codeine   . Benadryl [Diphenhydramine] Palpitations  . Morphine Itching and Rash  . Ondansetron Diarrhea  . Tylenol [Acetaminophen] Itching and Rash    Current Medications: Current Outpatient Medications  Medication Sig Dispense Refill  . allopurinol (ZYLOPRIM) 100 MG tablet TAKE 1 TABLET(100 MG) BY MOUTH DAILY as needed 90 tablet 1  . bisoprolol (ZEBETA) 10 MG tablet TAKE 1 TABLET(10 MG) BY MOUTH DAILY 90 tablet 0  . diclofenac sodium (VOLTAREN) 1 % GEL Apply 2 g topically 4 (four) times daily. 100 g 1    . diphenoxylate-atropine (LOMOTIL) 2.5-0.025 MG tablet Take 2 tablets by mouth daily as needed for diarrhea or loose stools. 45 tablet 2  . fentaNYL (DURAGESIC - DOSED MCG/HR) 25 MCG/HR patch Place 25 mcg onto the skin every 3 (three) days.     Marland Kitchen FLUoxetine (PROZAC) 40 MG capsule TAKE 1 CAPSULE EVERY DAY 90 capsule 1  . fluticasone (FLONASE) 50 MCG/ACT nasal spray Place 2 sprays into both nostrils daily. 16 g 2  . furosemide (LASIX) 20 MG tablet Take 20 mg by mouth as needed.     Marland Kitchen glucose blood (ONE TOUCH ULTRA TEST) test strip     . lisinopril (PRINIVIL,ZESTRIL) 10 MG tablet Take 10 mg by mouth daily.    . Magnesium Bisglycinate (MAG GLYCINATE) 100 MG TABS Take 100 mg by mouth 2 (two) times daily. 60 tablet  0  . Menaquinone-7 (VITAMIN K2) 100 MCG CAPS Take 1 tablet by mouth daily.     . metFORMIN (GLUCOPHAGE-XR) 500 MG 24 hr tablet Take 1 tablet (500 mg total) by mouth daily with breakfast. 90 tablet 1  . Misc Natural Products (OSTEO BI-FLEX ADV TRIPLE ST PO) Take 2 tablets by mouth daily.    . Multiple Vitamins-Minerals (HAIR/SKIN/NAILS/BIOTIN PO) Take 1 capsule by mouth 3 (three) times daily.    . NON FORMULARY     . Omega 3-6-9 Fatty Acids (OMEGA 3-6-9 COMPLEX) CAPS Take by mouth.    Marland Kitchen omeprazole (PRILOSEC) 40 MG capsule Take 1 capsule (40 mg total) by mouth daily. 90 capsule 1  . pravastatin (PRAVACHOL) 20 MG tablet TAKE 1 TABLET BY MOUTH  DAILY 90 tablet 1  . promethazine (PHENERGAN) 25 MG tablet Take 1 tablet (25 mg total) by mouth 2 (two) times daily as needed for nausea or vomiting. 60 tablet 0  . tiZANidine (ZANAFLEX) 4 MG tablet Take 1 tablet (4 mg total) by mouth every 8 (eight) hours as needed for muscle spasms. 270 tablet 1  . traZODone (DESYREL) 100 MG tablet TAKE 1 TABLET AT BEDTIME  FOR  SLEEP 90 tablet 1  . aspirin 81 MG chewable tablet Chew 1 tablet (81 mg total) by mouth daily. (Patient not taking: Reported on 05/26/2019) 30 tablet 0  . fexofenadine (ALLEGRA) 180 MG tablet  TAKE 1 TABLET(180 MG) BY MOUTH DAILY (Patient not taking: Reported on 05/26/2019) 30 tablet 5  . hydrOXYzine (ATARAX/VISTARIL) 10 MG tablet TAKE 1 TABLET(10 MG) BY MOUTH THREE TIMES DAILY AS NEEDED (Patient not taking: Reported on 05/26/2019) 90 tablet 0  . lidocaine-prilocaine (EMLA) cream APPLY TOPICALLY AS NEEDED. (Patient not taking: Reported on 05/26/2019) 30 g 0   No current facility-administered medications for this visit.   Facility-Administered Medications Ordered in Other Visits  Medication Dose Route Frequency Provider Last Rate Last Admin  . alteplase (CATHFLO ACTIVASE) injection 2 mg  2 mg Intracatheter Once PRN Charlaine Dalton R, MD      . heparin lock flush 100 unit/mL  500 Units Intravenous Once Renny Gunnarson C, MD      . heparin lock flush 100 unit/mL  250 Units Intracatheter Once PRN Charlaine Dalton R, MD      . heparin lock flush 100 unit/mL  500 Units Intracatheter Once PRN Charlaine Dalton R, MD      . magnesium sulfate IVPB 4 g 100 mL  4 g Intravenous Once Everlina Gotts C, MD      . sodium chloride flush (NS) 0.9 % injection 10 mL  10 mL Intravenous PRN Lequita Asal, MD   10 mL at 07/15/19 0846  . sodium chloride flush (NS) 0.9 % injection 10 mL  10 mL Intracatheter PRN Charlaine Dalton R, MD      . sodium chloride flush (NS) 0.9 % injection 3 mL  3 mL Intravenous Once PRN Cammie Sickle, MD        Review of Systems  Constitutional: Positive for diaphoresis (night sweats) and malaise/fatigue (majority of the time). Negative for chills, fever and weight loss (stable).       Feels "exhausted".  HENT: Negative.  Negative for congestion, ear pain, hearing loss, nosebleeds, sinus pain and sore throat.   Eyes: Negative.  Negative for blurred vision and double vision.  Respiratory: Negative.  Negative for cough, sputum production, shortness of breath and wheezing.   Cardiovascular: Negative.  Negative for chest pain, palpitations,  orthopnea,  leg swelling and PND.  Gastrointestinal: Positive for heartburn and nausea (chronic s/p gallbladder surgery; worsening; after dinner). Negative for abdominal pain, blood in stool, constipation, diarrhea, melena and vomiting.       Poor diet. Sensitive stomach.  Genitourinary: Negative for dysuria, frequency, hematuria and urgency.       Bladder spasms s/p interstim.  Musculoskeletal: Positive for back pain (chronic, spinal stenosis and pinched nerve; lower back (5/10) s/p interstim), joint pain (arthritis) and myalgias (leg cramps, worse at night). Negative for falls and neck pain (3 pinched nerves in neck; resolved).  Skin: Negative.  Negative for rash.  Neurological: Positive for sensory change (neuropathy in hands and feet). Negative for dizziness, speech change, focal weakness, seizures, weakness and headaches.       Restless legs.  Endo/Heme/Allergies: Negative.  Does not bruise/bleed easily.       Diabetes.  Psychiatric/Behavioral: Negative for depression and memory loss. The patient has insomnia (restless legs; nausea). The patient is not nervous/anxious.   All other systems reviewed and are negative.  Performance status (ECOG):  1-2  Vitals Blood pressure 115/62, pulse 64, temperature 98.5 F (36.9 C), temperature source Oral, resp. rate 18, weight 151 lb 12.6 oz (68.9 kg), SpO2 99 %.   Physical Exam  Constitutional: She is oriented to person, place, and time. She appears well-developed and well-nourished. No distress.  HENT:  Head: Normocephalic and atraumatic.  Right Ear: Ear canal normal.  Left Ear: Ear canal normal.  Mouth/Throat: Oropharynx is clear and moist. No oropharyngeal exudate.  Curly dark blonde hair pulled up.  Mask.  Eyes: Pupils are equal, round, and reactive to light. Conjunctivae and EOM are normal. No scleral icterus.  Glasses.  Neck: No JVD present.  Cardiovascular: Normal rate, regular rhythm and normal heart sounds.  No murmur heard. Pulmonary/Chest:  Effort normal and breath sounds normal. No respiratory distress. She has no wheezes. She has no rales.  Abdominal: Soft. Bowel sounds are normal. She exhibits no distension and no mass. There is no abdominal tenderness. There is no rebound and no guarding.  Musculoskeletal:        General: Tenderness (bilateral lower extremities) present. No edema. Normal range of motion.     Cervical back: Normal range of motion and neck supple.  Lymphadenopathy:       Head (right side): No submandibular, no preauricular and no posterior auricular adenopathy present.       Head (left side): No submandibular, no preauricular and no posterior auricular adenopathy present.    She has no cervical adenopathy.    She has no axillary adenopathy.       Right: No inguinal and no supraclavicular adenopathy present.       Left: No inguinal and no supraclavicular adenopathy present.  Neurological: She is alert and oriented to person, place, and time.  Skin: Skin is warm and dry. No rash noted. She is not diaphoretic. No erythema. No pallor.  Well healed 5 cm incision on lower back s/p interstim.  Psychiatric: She has a normal mood and affect. Her behavior is normal. Judgment and thought content normal.  Nursing note and vitals reviewed.   Infusion on 07/15/2019  Component Date Value Ref Range Status  . Magnesium 07/15/2019 1.3* 1.7 - 2.4 mg/dL Final   Performed at [facility], [address] [place], [place] [address]    Assessment:  Sarah [name] is a 64 y.o. female with stage III IgA lambda light chain multiple myeloma s/p  autologous stem cell transplant in 06/14/2015 at the [facility]. Bone marrow revealed 80% plasma cells. Lambda free light chains were 1340. She had nephrotic range proteinuria. She initially underwent induction with RVD. Revlimid maintenance was discontinued on 01/21/2017 secondary to intolerance.   M-spikehas been followed: 0 on 04/02/2016 -06/23/2019; and  0.2 on 09/02/2017.  Lambda light chainshave been followed: 22.2 (ratio 0.56) on 07/03/2017, 30.8 (ratio 0.78) on 09/02/2017, 36.9 (ratio 0.40) on 10/21/2017, 37.4 (ratio 0.41) on 12/16/2017, 70.7(ratio 0.31) on 02/17/2018, 64.2 (ratio 0.27) on 04/07/2018, 78.9 (ratio 0.18) on 05/26/2018, 128.8 (ratio 0.17) on 08/06/2018, 181.5 (ratio 0.13) on 10/08/2018, 130.9 (ratio 0.13) on 10/20/2018, 160.7 (ratio 0.10)on 12/09/2018, 236.6 (ratio 0.07) on 02/01/2019, 363.6 (ratio 0.04) on 03/22/2019, 404.8 (ratio 0.04) on 04/05/2019, 420.7 (ratio 0.03) on 05/24/2019, and 573.4 (ratio 0.03) on 06/23/2019.  24 hour UPEP on 06/03/2019 revealed kappa free light chains 95.76, lambda free light chains 1,260.71, and ratio 0.08.  Bone surveyon 04/08/2016 and11/28/2018 revealed no definite lytic lesion seen in the visualized skeleton.Bone surveyon 11/19/2018 revealed no suspicious lucent lesionsand no acute bony abnormality.  PET scan on 07/12/2019 revealed no focal metabolic activity to suggest active myeloma within the skeleton. There were no lytic lesions identified on the CT portion of the exam or soft tissue plasmacytomas. There was no evidence of multiple myeloma.   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 Zometa was discontinued in 01/2017.  She has B12 deficiency. B12 was 254 on 04/09/2017, 295 on 08/20/2018, and 391 on 10/08/2018. Shewasonoral B12.She received B12 monthly (last 07/01/2019).Folate was 18.6 on 02/08/2019.  She has chronic GI issues.  She has chronic nausea on Phenergan.  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  She has tried and failed multiple agents in the past.  She began Amitiza on 03/23/2019 at a low-dose rate that caused severe diarrhea.  She had significant improvement after increasing the dose of lactulose.   Symptomatically, she is fatigued.  She has chronic nausea.  Exam is  stable.  Plan: 1.   Labs today: Mg 2. Stage III multiple myeloma Clinically, she has become more fatigued. Sheiss/pautologous stem cell transplant in 06/2015.  She is off maintenance Revlimid secondary to intolerance.  M spike was 0 on 06/23/2019. Review increasing lambda light chains: Lambda light chains were236.6(ratio 0.07)on 02/01/2019. Lambda light chains were363.6(ratio 0.04)on 03/22/2019. Lambda light chains were404.8(ratio 0.04)on 04/05/2019. Lambda light chains were 420.07 (ratio 0.03) on 05/24/2019.                         Lambda light chains were 573.40 (ratio 0.03) on 06/23/2019.  Bone survey on 11/19/2018 revealed no lytic lesions. 24 hour UPEP with free light chains revealed lambda free light chains 1,260.71, and ratio 0.08. PET scan on on 07/12/2019 personally reviewed.   No evidence of myeloma.   Copy of scan for patient.  Discuss plan for bone marrow on 07/19/2019. 3. Normocytic anemia Hematocrit31.3. Hemoglobin10.6. MCV87.2.Platelets 123,000 on 07/01/2019.  Ferritin 32 with an iron saturation of 17% and TIBC 359. B12 248 (low) and folate 18.6 on 02/08/2019.  TSH was normal on02/20/2020  Creatinine 1.14(CrCl46.30m/minute). Retic1.2%on 07/01/2019. Patient intolerant of oral iron.  Venofer today and weekly x 2 (total 3).  Anemia is likely multifactorial with some component of chronic renal disease and iron deficiency. Possible component of myeloma. 4. Stage III chronic kidney disease BUN24. Creatinine1.14 on 07/01/2019. Creatinine has fluctuated between 1.07-1.66  in the past 6 months. 24 hour urine  with increased free light chains. Patient is followed by nephrology. 5. Hypomagnesemia, chronic Magnesium1.3today. Magnesium has fluctuated between 1.0-1.4 in the past 2 months. Magnesiumsulfate 4 gm IV today.  RTC weekly for labs (Mg) and +/- IV magnesium. 6. Osteonecrosis of the jaw ONJ occurred in 01/2017 secondary to Zometa. She receives no further bisphosphonates. 7. Chronic back pain Etiology has not been felt secondary to multiple myeloma. [facility]is [facility] and is on Fentanyl patches. 8. Grade I-II peripheral neuropathy Etiologyfelt secondary toprior chemotherapy. Continue to monitor. 9.Chronic nausea Patient is followed by Dr. Vicente Males. Patient continues Phenergan without significant relief.    Ondansetron causes diarrhea.    Consider scopolamine patch if cleared by ophthalmology. 10.B12 deficiency B12 was 391 on 10/08/2018. B12 injections began on 02/15/2019 (last 07/01/2019). RTC monthly for B12. Folate was 18.6 on 02/08/2019. Follow folate yearly. 11.RTC weekly x 2 for labs (Mg) and IV magnesium and Venofer. 12.   RTC on 07/29/2019 for MD assessment and review of bone marrow.  I discussed the assessment and treatment plan with the patient.  The patient was provided an opportunity to ask questions and all were answered.  The patient agreed with the plan and demonstrated an understanding of the instructions.  The patient was advised to call back if the symptoms worsen or if the condition fails to improve as anticipated.  I provided 15 minutes of face-to-face time during this this encounter and > 50% was spent counseling as documented under my assessment and plan.    Lequita Asal, MD, PhD    07/15/2019, 9:54  AM  I, Selena Batten, am acting as scribe for [employer]. Mike Gip, MD, PhD.  I, Tyson Parkison C. Mike Gip, MD, have reviewed the above documentation for accuracy and completeness, and I agree with the above.

## 2019-07-15 ENCOUNTER — Other Ambulatory Visit: Payer: Self-pay | Admitting: Radiology

## 2019-07-15 ENCOUNTER — Inpatient Hospital Stay: Payer: Medicare PPO

## 2019-07-15 ENCOUNTER — Other Ambulatory Visit: Payer: Self-pay

## 2019-07-15 ENCOUNTER — Inpatient Hospital Stay: Payer: Medicare PPO | Admitting: Hematology and Oncology

## 2019-07-15 VITALS — BP 115/62 | HR 64 | Temp 98.5°F | Resp 18 | Wt 151.8 lb

## 2019-07-15 DIAGNOSIS — C9001 Multiple myeloma in remission: Secondary | ICD-10-CM

## 2019-07-15 DIAGNOSIS — D509 Iron deficiency anemia, unspecified: Secondary | ICD-10-CM | POA: Diagnosis not present

## 2019-07-15 DIAGNOSIS — M879 Osteonecrosis, unspecified: Secondary | ICD-10-CM | POA: Diagnosis not present

## 2019-07-15 DIAGNOSIS — E538 Deficiency of other specified B group vitamins: Secondary | ICD-10-CM | POA: Diagnosis not present

## 2019-07-15 DIAGNOSIS — I130 Hypertensive heart and chronic kidney disease with heart failure and stage 1 through stage 4 chronic kidney disease, or unspecified chronic kidney disease: Secondary | ICD-10-CM | POA: Diagnosis not present

## 2019-07-15 DIAGNOSIS — G8929 Other chronic pain: Secondary | ICD-10-CM | POA: Diagnosis not present

## 2019-07-15 DIAGNOSIS — D649 Anemia, unspecified: Secondary | ICD-10-CM | POA: Diagnosis not present

## 2019-07-15 DIAGNOSIS — M549 Dorsalgia, unspecified: Secondary | ICD-10-CM | POA: Diagnosis not present

## 2019-07-15 DIAGNOSIS — R5383 Other fatigue: Secondary | ICD-10-CM | POA: Diagnosis not present

## 2019-07-15 DIAGNOSIS — N183 Chronic kidney disease, stage 3 unspecified: Secondary | ICD-10-CM | POA: Diagnosis not present

## 2019-07-15 DIAGNOSIS — Z7189 Other specified counseling: Secondary | ICD-10-CM | POA: Diagnosis not present

## 2019-07-15 DIAGNOSIS — D500 Iron deficiency anemia secondary to blood loss (chronic): Secondary | ICD-10-CM

## 2019-07-15 DIAGNOSIS — C9000 Multiple myeloma not having achieved remission: Secondary | ICD-10-CM | POA: Diagnosis not present

## 2019-07-15 LAB — MAGNESIUM: Magnesium: 1.3 mg/dL — ABNORMAL LOW (ref 1.7–2.4)

## 2019-07-15 MED ORDER — HEPARIN SOD (PORK) LOCK FLUSH 100 UNIT/ML IV SOLN
500.0000 [IU] | Freq: Once | INTRAVENOUS | Status: DC | PRN
  Filled 2019-07-15: qty 5

## 2019-07-15 MED ORDER — HEPARIN SOD (PORK) LOCK FLUSH 100 UNIT/ML IV SOLN
500.0000 [IU] | Freq: Once | INTRAVENOUS | Status: AC
  Administered 2019-07-15: 500 [IU] via INTRAVENOUS
  Filled 2019-07-15: qty 5

## 2019-07-15 MED ORDER — SODIUM CHLORIDE 0.9% FLUSH
3.0000 mL | Freq: Once | INTRAVENOUS | Status: DC | PRN
  Filled 2019-07-15: qty 3

## 2019-07-15 MED ORDER — IRON SUCROSE 20 MG/ML IV SOLN
200.0000 mg | Freq: Once | INTRAVENOUS | Status: AC
  Administered 2019-07-15: 200 mg via INTRAVENOUS

## 2019-07-15 MED ORDER — HEPARIN SOD (PORK) LOCK FLUSH 100 UNIT/ML IV SOLN
250.0000 [IU] | Freq: Once | INTRAVENOUS | Status: DC | PRN
  Filled 2019-07-15: qty 5

## 2019-07-15 MED ORDER — SODIUM CHLORIDE 0.9% FLUSH
10.0000 mL | INTRAVENOUS | Status: DC | PRN
  Administered 2019-07-15: 10 mL via INTRAVENOUS
  Filled 2019-07-15: qty 10

## 2019-07-15 MED ORDER — SODIUM CHLORIDE 0.9 % IV SOLN: 200.0000 mg | Freq: Once | INTRAVENOUS | Status: DC

## 2019-07-15 MED ORDER — MAGNESIUM SULFATE 4 GM/100ML IV SOLN
4.0000 g | Freq: Once | INTRAVENOUS | Status: AC
  Administered 2019-07-15: 10:00:00 4 g via INTRAVENOUS
  Filled 2019-07-15: qty 100

## 2019-07-15 MED ORDER — SODIUM CHLORIDE 0.9 % IV SOLN
Freq: Once | INTRAVENOUS | Status: AC
  Filled 2019-07-15: qty 250

## 2019-07-15 MED ORDER — SODIUM CHLORIDE 0.9% FLUSH
10.0000 mL | INTRAVENOUS | Status: DC | PRN
  Filled 2019-07-15: qty 10

## 2019-07-15 MED ORDER — ALTEPLASE 2 MG IJ SOLR
2.0000 mg | Freq: Once | INTRAMUSCULAR | Status: DC | PRN
  Filled 2019-07-15: qty 2

## 2019-07-15 NOTE — Patient Instructions (Signed)
Iron Sucrose injection What is this medicine? IRON SUCROSE (AHY ern SOO krohs) is an iron complex. Iron is used to make healthy red blood cells, which carry oxygen and nutrients throughout the body. This medicine is used to treat iron deficiency anemia in people with chronic kidney disease. This medicine may be used for other purposes; ask your health care provider or pharmacist if you have questions. COMMON BRAND NAME(S): Venofer What should I tell my health care provider before I take this medicine? They need to know if you have any of these conditions:  anemia not caused by low iron levels  heart disease  high levels of iron in the blood  kidney disease  liver disease  an unusual or allergic reaction to iron, other medicines, foods, dyes, or preservatives  pregnant or trying to get pregnant  breast-feeding How should I use this medicine? This medicine is for infusion into a vein. It is given by a health care professional in a hospital or clinic setting. Talk to your pediatrician regarding the use of this medicine in children. While this drug may be prescribed for children as young as 2 years for selected conditions, precautions do apply. Overdosage: If you think you have taken too much of this medicine contact a poison control center or emergency room at once. NOTE: This medicine is only for you. Do not share this medicine with others. What if I miss a dose? It is important not to miss your dose. Call your doctor or health care professional if you are unable to keep an appointment. What may interact with this medicine? Do not take this medicine with any of the following medications:  deferoxamine  dimercaprol  other iron products This medicine may also interact with the following medications:  chloramphenicol  deferasirox This list may not describe all possible interactions. Give your health care provider a list of all the medicines, herbs, non-prescription drugs, or  dietary supplements you use. Also tell them if you smoke, drink alcohol, or use illegal drugs. Some items may interact with your medicine. What should I watch for while using this medicine? Visit your doctor or healthcare professional regularly. Tell your doctor or healthcare professional if your symptoms do not start to get better or if they get worse. You may need blood work done while you are taking this medicine. You may need to follow a special diet. Talk to your doctor. Foods that contain iron include: whole grains/cereals, dried fruits, beans, or peas, leafy green vegetables, and organ meats (liver, kidney). What side effects may I notice from receiving this medicine? Side effects that you should report to your doctor or health care professional as soon as possible:  allergic reactions like skin rash, itching or hives, swelling of the face, lips, or tongue  breathing problems  changes in blood pressure  cough  fast, irregular heartbeat  feeling faint or lightheaded, falls  fever or chills  flushing, sweating, or hot feelings  joint or muscle aches/pains  seizures  swelling of the ankles or feet  unusually weak or tired Side effects that usually do not require medical attention (report to your doctor or health care professional if they continue or are bothersome):  diarrhea  feeling achy  headache  irritation at site where injected  nausea, vomiting  stomach upset  tiredness This list may not describe all possible side effects. Call your doctor for medical advice about side effects. You may report side effects to FDA at 1-800-FDA-1088. Where should I keep   my medicine? This drug is given in a hospital or clinic and will not be stored at home. NOTE: This sheet is a summary. It may not cover all possible information. If you have questions about this medicine, talk to your doctor, pharmacist, or health care provider.  2020 Elsevier/Gold Standard (2011-03-28  17:14:35) Magnesium Sulfate injection What is this medicine? MAGNESIUM SULFATE (mag NEE zee um SUL fate) is an electrolyte injection commonly used to treat low magnesium levels in your blood. It is also used to prevent or control seizures in women with preeclampsia or eclampsia. This medicine may be used for other purposes; ask your health care provider or pharmacist if you have questions. What should I tell my health care provider before I take this medicine? They need to know if you have any of these conditions:  heart disease  history of irregular heart beat  kidney disease  an unusual or allergic reaction to magnesium sulfate, medicines, foods, dyes, or preservatives  pregnant or trying to get pregnant  breast-feeding How should I use this medicine? This medicine is for infusion into a vein. It is given by a health care professional in a hospital or clinic setting. Talk to your pediatrician regarding the use of this medicine in children. While this drug may be prescribed for selected conditions, precautions do apply. Overdosage: If you think you have taken too much of this medicine contact a poison control center or emergency room at once. NOTE: This medicine is only for you. Do not share this medicine with others. What if I miss a dose? This does not apply. What may interact with this medicine? This medicine may interact with the following medications:  certain medicines for anxiety or sleep  certain medicines for seizures like phenobarbital  digoxin  medicines that relax muscles for surgery  narcotic medicines for pain This list may not describe all possible interactions. Give your health care provider a list of all the medicines, herbs, non-prescription drugs, or dietary supplements you use. Also tell them if you smoke, drink alcohol, or use illegal drugs. Some items may interact with your medicine. What should I watch for while using this medicine? Your condition will  be monitored carefully while you are receiving this medicine. You may need blood work done while you are receiving this medicine. What side effects may I notice from receiving this medicine? Side effects that you should report to your doctor or health care professional as soon as possible:  allergic reactions like skin rash, itching or hives, swelling of the face, lips, or tongue  facial flushing  muscle weakness  signs and symptoms of low blood pressure like dizziness; feeling faint or lightheaded, falls; unusually weak or tired  signs and symptoms of a dangerous change in heartbeat or heart rhythm like chest pain; dizziness; fast or irregular heartbeat; palpitations; breathing problems  sweating This list may not describe all possible side effects. Call your doctor for medical advice about side effects. You may report side effects to FDA at 1-800-FDA-1088. Where should I keep my medicine? This drug is given in a hospital or clinic and will not be stored at home. NOTE: This sheet is a summary. It may not cover all possible information. If you have questions about this medicine, talk to your doctor, pharmacist, or health care provider.  2020 Elsevier/Gold Standard (2016-01-03 12:31:42)

## 2019-07-15 NOTE — Progress Notes (Signed)
Patient here for follow up. Reports she feels "excessively tired" since November/Decemeber and feels it is getting worse. Also would like to see if her nausea medication can be increased.

## 2019-07-16 ENCOUNTER — Ambulatory Visit: Payer: Medicare PPO

## 2019-07-19 ENCOUNTER — Ambulatory Visit
Admission: RE | Admit: 2019-07-19 | Discharge: 2019-07-19 | Disposition: A | Discharge status: Home / Self Care | Payer: Medicare PPO | Source: Ambulatory Visit | Attending: Hematology and Oncology | Admitting: Hematology and Oncology

## 2019-07-19 ENCOUNTER — Other Ambulatory Visit: Payer: Self-pay

## 2019-07-19 DIAGNOSIS — M109 Gout, unspecified: Secondary | ICD-10-CM | POA: Diagnosis not present

## 2019-07-19 DIAGNOSIS — Z90710 Acquired absence of both cervix and uterus: Secondary | ICD-10-CM | POA: Insufficient documentation

## 2019-07-19 DIAGNOSIS — K219 Gastro-esophageal reflux disease without esophagitis: Secondary | ICD-10-CM | POA: Diagnosis not present

## 2019-07-19 DIAGNOSIS — I509 Heart failure, unspecified: Secondary | ICD-10-CM | POA: Diagnosis not present

## 2019-07-19 DIAGNOSIS — D696 Thrombocytopenia, unspecified: Secondary | ICD-10-CM | POA: Diagnosis not present

## 2019-07-19 DIAGNOSIS — E1122 Type 2 diabetes mellitus with diabetic chronic kidney disease: Secondary | ICD-10-CM | POA: Diagnosis not present

## 2019-07-19 DIAGNOSIS — Z9481 Bone marrow transplant status: Secondary | ICD-10-CM | POA: Diagnosis not present

## 2019-07-19 DIAGNOSIS — Z801 Family history of malignant neoplasm of trachea, bronchus and lung: Secondary | ICD-10-CM | POA: Insufficient documentation

## 2019-07-19 DIAGNOSIS — Z803 Family history of malignant neoplasm of breast: Secondary | ICD-10-CM | POA: Insufficient documentation

## 2019-07-19 DIAGNOSIS — Z9049 Acquired absence of other specified parts of digestive tract: Secondary | ICD-10-CM | POA: Diagnosis not present

## 2019-07-19 DIAGNOSIS — Z885 Allergy status to narcotic agent status: Secondary | ICD-10-CM | POA: Insufficient documentation

## 2019-07-19 DIAGNOSIS — F329 Major depressive disorder, single episode, unspecified: Secondary | ICD-10-CM | POA: Diagnosis not present

## 2019-07-19 DIAGNOSIS — Z8249 Family history of ischemic heart disease and other diseases of the circulatory system: Secondary | ICD-10-CM | POA: Diagnosis not present

## 2019-07-19 DIAGNOSIS — Z9181 History of falling: Secondary | ICD-10-CM | POA: Diagnosis not present

## 2019-07-19 DIAGNOSIS — C9001 Multiple myeloma in remission: Secondary | ICD-10-CM | POA: Insufficient documentation

## 2019-07-19 DIAGNOSIS — Z9221 Personal history of antineoplastic chemotherapy: Secondary | ICD-10-CM | POA: Diagnosis not present

## 2019-07-19 DIAGNOSIS — N183 Chronic kidney disease, stage 3 unspecified: Secondary | ICD-10-CM | POA: Diagnosis not present

## 2019-07-19 DIAGNOSIS — Z888 Allergy status to other drugs, medicaments and biological substances status: Secondary | ICD-10-CM | POA: Insufficient documentation

## 2019-07-19 DIAGNOSIS — I130 Hypertensive heart and chronic kidney disease with heart failure and stage 1 through stage 4 chronic kidney disease, or unspecified chronic kidney disease: Secondary | ICD-10-CM | POA: Diagnosis not present

## 2019-07-19 DIAGNOSIS — Z79899 Other long term (current) drug therapy: Secondary | ICD-10-CM | POA: Diagnosis not present

## 2019-07-19 DIAGNOSIS — Z800 Family history of malignant neoplasm of digestive organs: Secondary | ICD-10-CM | POA: Insufficient documentation

## 2019-07-19 DIAGNOSIS — Z886 Allergy status to analgesic agent status: Secondary | ICD-10-CM | POA: Diagnosis not present

## 2019-07-19 DIAGNOSIS — Z87891 Personal history of nicotine dependence: Secondary | ICD-10-CM | POA: Insufficient documentation

## 2019-07-19 DIAGNOSIS — M1990 Unspecified osteoarthritis, unspecified site: Secondary | ICD-10-CM | POA: Insufficient documentation

## 2019-07-19 DIAGNOSIS — C9000 Multiple myeloma not having achieved remission: Secondary | ICD-10-CM | POA: Diagnosis not present

## 2019-07-19 DIAGNOSIS — D649 Anemia, unspecified: Secondary | ICD-10-CM | POA: Diagnosis not present

## 2019-07-19 DIAGNOSIS — Z791 Long term (current) use of non-steroidal anti-inflammatories (NSAID): Secondary | ICD-10-CM | POA: Insufficient documentation

## 2019-07-19 DIAGNOSIS — Z833 Family history of diabetes mellitus: Secondary | ICD-10-CM | POA: Insufficient documentation

## 2019-07-19 DIAGNOSIS — F419 Anxiety disorder, unspecified: Secondary | ICD-10-CM | POA: Insufficient documentation

## 2019-07-19 DIAGNOSIS — Q231 Congenital insufficiency of aortic valve: Secondary | ICD-10-CM | POA: Diagnosis not present

## 2019-07-19 DIAGNOSIS — Z7984 Long term (current) use of oral hypoglycemic drugs: Secondary | ICD-10-CM | POA: Insufficient documentation

## 2019-07-19 DIAGNOSIS — Z808 Family history of malignant neoplasm of other organs or systems: Secondary | ICD-10-CM | POA: Insufficient documentation

## 2019-07-19 LAB — CBC WITH DIFFERENTIAL/PLATELET
Abs Immature Granulocytes: 0.02 10*3/uL (ref 0.00–0.07)
Basophils Absolute: 0 10*3/uL (ref 0.0–0.1)
Basophils Relative: 0 %
Eosinophils Absolute: 0.2 10*3/uL (ref 0.0–0.5)
Eosinophils Relative: 3 %
HCT: 33.1 % — ABNORMAL LOW (ref 36.0–46.0)
Hemoglobin: 11.1 g/dL — ABNORMAL LOW (ref 12.0–15.0)
Immature Granulocytes: 0 %
Lymphocytes Relative: 20 %
Lymphs Abs: 1.1 10*3/uL (ref 0.7–4.0)
MCH: 29.1 pg (ref 26.0–34.0)
MCHC: 33.5 g/dL (ref 30.0–36.0)
MCV: 86.6 fL (ref 80.0–100.0)
Monocytes Absolute: 0.4 10*3/uL (ref 0.1–1.0)
Monocytes Relative: 7 %
Neutro Abs: 3.9 10*3/uL (ref 1.7–7.7)
Neutrophils Relative %: 70 %
Platelets: 123 10*3/uL — ABNORMAL LOW (ref 150–400)
RBC: 3.82 MIL/uL — ABNORMAL LOW (ref 3.87–5.11)
RDW: 12.9 % (ref 11.5–15.5)
WBC: 5.7 10*3/uL (ref 4.0–10.5)
nRBC: 0 % (ref 0.0–0.2)

## 2019-07-19 LAB — GLUCOSE, CAPILLARY: Glucose-Capillary: 139 mg/dL — ABNORMAL HIGH (ref 70–99)

## 2019-07-19 MED ORDER — HEPARIN SOD (PORK) LOCK FLUSH 100 UNIT/ML IV SOLN
INTRAVENOUS | Status: AC
  Filled 2019-07-19: qty 5

## 2019-07-19 MED ORDER — FENTANYL CITRATE (PF) 100 MCG/2ML IJ SOLN
INTRAMUSCULAR | Status: AC
  Filled 2019-07-19: qty 4

## 2019-07-19 MED ORDER — SODIUM CHLORIDE 0.9 % IV SOLN: INTRAVENOUS | Status: DC

## 2019-07-19 MED ORDER — MIDAZOLAM HCL 2 MG/2ML IJ SOLN
INTRAMUSCULAR | Status: AC | PRN
  Administered 2019-07-19 (×3): 1 mg via INTRAVENOUS

## 2019-07-19 MED ORDER — FENTANYL CITRATE (PF) 100 MCG/2ML IJ SOLN
INTRAMUSCULAR | Status: AC | PRN
  Administered 2019-07-19 (×2): 50 ug via INTRAVENOUS

## 2019-07-19 MED ORDER — HEPARIN SOD (PORK) LOCK FLUSH 100 UNIT/ML IV SOLN
INTRAVENOUS | Status: AC
  Administered 2019-07-19: 500 [IU]
  Filled 2019-07-19: qty 5

## 2019-07-19 MED ORDER — MIDAZOLAM HCL 5 MG/5ML IJ SOLN
INTRAMUSCULAR | Status: AC
  Filled 2019-07-19: qty 5

## 2019-07-19 NOTE — Discharge Instructions (Signed)
Bone Marrow Aspiration and Bone Marrow Biopsy, Adult, Care After This sheet gives you information about how to care for yourself after your procedure. If you have problems or questions, contact your health care provider.  What can I expect after the procedure?  After the procedure, it is common to have: Mild pain and tenderness. Swelling. Bruising.  Follow these instructions at home: Take over-the-counter or prescription medicines only as told by your health care provider. You may shower tomorrow Remove band aid tomorrow, replace with another bandaid if  site has any drainage from biopsy site. Wash your hands with soap and water before you touch your biopsy site  If soap and water are not available, use hand sanitizer. Change your dressing frequently for bleeding and/or drainage. Check your puncture site every day for signs of infection. Check for: More redness, swelling, or pain. More fluid or blood. Warmth. Pus or a bad smell. Return to your normal activities in 24hours.  Do not drive for 24 hours if you were given a medicine to help you relax (sedative). Keep all follow-up visits as told by your health care provider. This is important. Contact a health care provider if: You have more redness, swelling, or pain around the puncture site. You have more fluid or blood coming from the puncture site. Your puncture site feels warm to the touch. You have pus or a bad smell coming from the puncture site. You have a fever. Your pain is not controlled with medicine. This information is not intended to replace advice given to you by your health care provider. Make sure you discuss any questions you have with your health care provider. Document Released: 01/04/2005 Document Revised: 01/05/2016 Document Reviewed: 11/29/2015 Elsevier Interactive Patient Education  2018 Elsevier Inc. 

## 2019-07-19 NOTE — H&P (Signed)
Chief Complaint: Mx Myeloma  Referring Physician(s): [place] C  Supervising Physician: Corrie Mckusick  Patient Status: [facility] - Out-pt  History of Present Illness: Sarah [name] is a 64 y.o. female presents for repeat bone marrow bx, history of Mx Myeloma.   She denies any new symptoms, including cough, fever, rigors/chills.   She does request a left iliac biopsy site, as she is status post implant on the right posterior flank recently for urologic implant.   Past Medical History:  Diagnosis Date  . Abnormal stress test 02/14/2016   Overview:  Added automatically from request for surgery [id number]  . Anemia   . Anxiety   . Arthritis   . Bicuspid aortic valve   . CHF (congestive heart failure) ([place])   . CKD (chronic kidney disease) stage 3, GFR 30-59 ml/min   . Depression   . Diabetes mellitus ([place])   . Dizziness   . Fatty liver   . Frequent falls   . GERD (gastroesophageal reflux disease)   . Gout   . Heart murmur   . History of blood transfusion   . History of bone marrow transplant ([place])   . History of uterine fibroid   . Hx of cardiac catheterization 06/05/2016   Overview:  Normal coronaries 2017  . Hypertension   . Hypomagnesemia   . Multiple myeloma ([place])   . Personal history of chemotherapy   . Renal cyst     Past Surgical History:  Procedure Laterality Date  . ABDOMINAL HYSTERECTOMY    . 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 CARDIAC ELECTROPHYSIOLOGY MAPPING AND ABLATION    . CARPAL TUNNEL RELEASE Bilateral   . CHOLECYSTECTOMY  2008  . COLONOSCOPY WITH PROPOFOL N/A 05/07/2017   Procedure: COLONOSCOPY WITH PROPOFOL;  Surgeon: [name], MD;  Location: [facility] ENDOSCOPY;  Service: Gastroenterology;  Laterality: N/A;  . ESOPHAGOGASTRODUODENOSCOPY (EGD) WITH PROPOFOL N/A 05/07/2017   Procedure: ESOPHAGOGASTRODUODENOSCOPY (EGD) WITH PROPOFOL;  Surgeon: [name], MD;  Location: [facility] ENDOSCOPY;  Service: Gastroenterology;  Laterality: N/A;  . FOOT  SURGERY Bilateral   . INCONTINENCE SURGERY  2009  . other     over active bladder  . OTHER SURGICAL HISTORY     bladder stimulator   . PARTIAL HYSTERECTOMY  03/1996   fibroids  . PORTA CATH INSERTION N/A 03/10/2019   Procedure: PORTA CATH INSERTION;  Surgeon: Algernon Huxley, MD;  Location: [place] CV LAB;  Service: Cardiovascular;  Laterality: N/A;  . TONSILLECTOMY  2007    Allergies: Oxycodone-acetaminophen, Celebrex [celecoxib], Codeine, Benadryl [diphenhydramine], Morphine, Ondansetron, and Tylenol [acetaminophen]  Medications: Prior to Admission medications   Medication Sig Start Date End Date Taking? Authorizing Provider  bisoprolol (ZEBETA) 10 MG tablet TAKE 1 TABLET(10 MG) BY MOUTH DAILY 04/16/18  Yes Glean Hess, MD  FLUoxetine (PROZAC) 40 MG capsule TAKE 1 CAPSULE EVERY DAY 07/04/19  Yes Glean Hess, MD  Multiple Vitamins-Minerals (HAIR/SKIN/NAILS/BIOTIN PO) Take 1 capsule by mouth 3 (three) times daily.   Yes [provider]  omeprazole (PRILOSEC) 40 MG capsule Take 1 capsule (40 mg total) by mouth daily. 12/11/18  Yes Glean Hess, MD  promethazine (PHENERGAN) 25 MG tablet Take 1 tablet (25 mg total) by mouth 2 (two) times daily as needed for nausea or vomiting. 06/10/19  Yes Corcoran, Drue Second, MD  tiZANidine (ZANAFLEX) 4 MG tablet Take 1 tablet (4 mg total) by mouth every 8 (eight) hours as needed for muscle spasms. 12/02/18  Yes Glean Hess,  MD  allopurinol (ZYLOPRIM) 100 MG tablet TAKE 1 TABLET(100 MG) BY MOUTH DAILY as needed 12/02/18   Glean Hess, MD  aspirin 81 MG chewable tablet Chew 1 tablet (81 mg total) by mouth daily. Patient not taking: Reported on 05/26/2019 07/27/17   Gladstone Lighter, MD  diclofenac sodium (VOLTAREN) 1 % GEL Apply 2 g topically 4 (four) times daily. 01/25/19   Gertie Baron, NP  diphenoxylate-atropine (LOMOTIL) 2.5-0.025 MG tablet Take 2 tablets by mouth daily as needed for diarrhea or loose stools.  04/14/18   Cammie Sickle, MD  fentaNYL (DURAGESIC - DOSED MCG/HR) 25 MCG/HR patch Place 25 mcg onto the skin every 3 (three) days.  08/12/16   [provider]  fexofenadine (ALLEGRA) 180 MG tablet TAKE 1 TABLET(180 MG) BY MOUTH DAILY Patient not taking: Reported on 05/26/2019 05/30/17   Glean Hess, MD  fluticasone [facility]) 50 MCG/ACT nasal spray Place 2 sprays into both nostrils daily. 07/10/18   Glean Hess, MD  furosemide (LASIX) 20 MG tablet Take 20 mg by mouth as needed.  12/09/18   [provider]  glucose blood (ONE TOUCH ULTRA TEST) test strip  09/04/15   [provider]  hydrOXYzine (ATARAX/VISTARIL) 10 MG tablet TAKE 1 TABLET(10 MG) BY MOUTH THREE TIMES DAILY AS NEEDED Patient not taking: Reported on 05/26/2019 05/30/17   Glean Hess, MD  lidocaine-prilocaine (EMLA) cream APPLY TOPICALLY AS NEEDED. Patient not taking: Reported on 05/26/2019 04/07/19   Lequita Asal, MD  lisinopril (PRINIVIL,ZESTRIL) 10 MG tablet Take 10 mg by mouth daily.    [provider]  Magnesium Bisglycinate (MAG GLYCINATE) 100 MG TABS Take 100 mg by mouth 2 (two) times daily. Patient not taking: Reported on 07/19/2019 08/20/18   Karen Kitchens, NP  Menaquinone-7 (VITAMIN K2) 100 MCG CAPS Take 1 tablet by mouth daily.     [provider]  metFORMIN (GLUCOPHAGE-XR) 500 MG 24 hr tablet Take 1 tablet (500 mg total) by mouth daily with breakfast. 12/02/18   Glean Hess, MD  Misc Natural Products (OSTEO BI-FLEX ADV TRIPLE ST PO) Take 2 tablets by mouth daily.    [provider]  NON FORMULARY     [provider]  Omega 3-6-9 Fatty Acids (OMEGA 3-6-9 COMPLEX) CAPS Take by mouth.    [provider]  pravastatin (PRAVACHOL) 20 MG tablet TAKE 1 TABLET BY MOUTH  DAILY 12/02/18   Glean Hess, MD  traZODone (DESYREL) 100 MG tablet TAKE 1 TABLET AT BEDTIME  FOR  SLEEP 04/28/19   Glean Hess, MD     Family History    Problem Relation Age of Onset  . Colon cancer Father   . Renal Disease Father   . Diabetes Mellitus II Father   . Melanoma Paternal Grandmother   . Breast cancer Maternal Aunt 3  . Anemia Mother   . Heart disease Mother   . Heart failure Mother   . Renal Disease Mother   . Congestive Heart Failure Mother   . Heart disease Maternal Uncle   . Throat cancer Maternal Uncle   . Lung cancer Maternal Uncle   . Liver disease Maternal Uncle   . Heart failure Maternal Uncle   . Hearing loss Son 40       Suicide     Social History   Socioeconomic History  . Marital status: Widowed    Spouse name: Not on file  . Number of children: Not on file  .  Years of education: Not on file  . Highest education level: Not on file  Occupational History  . Occupation: Disabled  Tobacco Use  . Smoking status: Former Smoker    Packs/day: 1.00    Years: 20.00    Pack years: 20.00    Types: Cigarettes    Quit date: 07/02/1991    Years since quitting: 28.0  . Smokeless tobacco: Never Used  Substance and Sexual Activity  . Alcohol use: Not Currently  . Drug use: No  . Sexual activity: Never  Other Topics Concern  . Not on file  Social History Narrative  . Not on file   Social Determinants of Health   Financial Resource Strain:   . Difficulty of Paying Living Expenses: Not on file  Food Insecurity:   . Worried About Charity fundraiser in the Last Year: Not on file  . Ran Out of Food in the Last Year: Not on file  Transportation Needs:   . Lack of Transportation (Medical): Not on file  . Lack of Transportation (Non-Medical): Not on file  Physical Activity:   . Days of Exercise per Week: Not on file  . Minutes of Exercise per Session: Not on file  Stress:   . Feeling of Stress : Not on file  Social Connections:   . Frequency of Communication with Friends and Family: Not on file  . Frequency of Social Gatherings with Friends and Family: Not on file  . Attends Religious Services: Not on  file  . Active Member of Clubs or Organizations: Not on file  . Attends Archivist Meetings: Not on file  . Marital Status: Not on file      Review of Systems: A 12 point ROS discussed and pertinent positives are indicated in the HPI above.  All other systems are negative.  Review of Systems  Vital Signs: BP (!) 153/77 (BP Location: Right Arm)   Pulse 68   Temp 98.3 F (36.8 C) (Oral)   Resp 10   Ht 5' 2.5" (1.588 m)   Wt 68.5 kg   SpO2 100%   BMI 27.18 kg/m   Physical Exam General: 64 yo female appearing stated age.  Well-developed, well-nourished.  No distress. HEENT: Atraumatic, normocephalic.  Conjugate gaze, extra-ocular motor intact. No scleral icterus or scleral injection. No lesions on external ears, nose, lips, or gums.  Oral mucosa moist, pink.  Neck: Symmetric with no goiter enlargement.  Chest/Lungs:  Symmetric chest with inspiration/expiration.  No labored breathing.  Clear to auscultation with no wheezes, rhonchi, or rales.  Heart:  RRR, with no third heart sounds appreciated. No JVD appreciated.  Abdomen:  Soft, NT/ND, with + bowel sounds.   Genito-urinary: Deferred Neurologic: Alert & Oriented to person, place, and time.   Normal affect and insight.  Appropriate questions.  Moving all 4 extremities with gross sensory intact.     Imaging: NM PET Image Initial (PI) Whole Body  Result Date: 07/12/2019 CLINICAL DATA:  Subsequent treatment strategy for multiple myeloma. EXAM: NUCLEAR MEDICINE PET WHOLE BODY TECHNIQUE: 7.33 mCi F-18 FDG was injected intravenously. Full-ring PET imaging was performed from the skull base to thigh after the radiotracer. CT data was obtained and used for attenuation correction and anatomic localization. Fasting blood glucose: 112 mg/dl COMPARISON:  None. FINDINGS: Mediastinal blood pool activity: SUV max 2.5 HEAD/NECK: No hypermetabolic activity in the scalp. No hypermetabolic cervical lymph nodes. Incidental CT findings: none  CHEST: No hypermetabolic mediastinal or hilar nodes. No suspicious  pulmonary nodules on the CT scan. Incidental CT findings: none ABDOMEN/PELVIS: No abnormal hypermetabolic activity within the liver, pancreas, adrenal glands, or spleen. No hypermetabolic lymph nodes in the abdomen or pelvis. Incidental CT findings: Post hysterectomy. No soft tissue plasmacytoma SKELETON: No focal hypermetabolic activity to suggest skeletal metastasis. Incidental CT findings: No lytic or blastic lesions. EXTREMITIES: No abnormal hypermetabolic activity in the lower extremities. Incidental CT findings: none IMPRESSION: 1. No focal metabolic activity to suggest active myeloma within the skeleton. 2. No lytic lesions identified on the CT portion exam. 3. No soft tissue plasmacytoma. 4. No evidence of multiple myeloma on whole-body FDG PET-CT scan. Electronically Signed   By: Suzy Bouchard M.D.   On: 07/12/2019 14:58    Labs:  CBC: Recent Labs    04/05/19 0922 05/31/19 0909 07/01/19 0907 07/19/19 0802  WBC 5.3 5.2 4.8 5.7  HGB 10.9* 9.9* 10.6* 11.1*  HCT 31.8* 29.2* 31.3* 33.1*  PLT 114* 131* 123* 123*    COAGS: No results for input(s): INR, APTT in the last 8760 hours.  BMP: Recent Labs    02/01/19 0842 04/05/19 0922 05/31/19 0909 07/01/19 0907  NA 134* 136 134* 138  K 4.4 4.0 4.3 4.6  CL 105 104 103 106  CO2 19* 21* 21* 23  GLUCOSE 174* 143* 116* 133*  BUN 28* 31* 35* 24*  CALCIUM 9.4 8.8* 8.7* 9.2  CREATININE 1.18* 1.62* 1.45* 1.14*  GFRNONAA 49* 34* 38* 51*  GFRAA 57* 39* 45* 60*    LIVER FUNCTION TESTS: Recent Labs    02/01/19 0842 04/05/19 0922 05/31/19 0909 07/01/19 0907  BILITOT 0.2* 0.4 0.4 0.4  AST 34 36 23 26  ALT 38 37 21 20  ALKPHOS 95 86 80 80  PROT 7.0 7.1 6.7 6.8  ALBUMIN 4.3 4.2 4.3 4.3    TUMOR MARKERS: No results for input(s): AFPTM, CEA, CA199, CHROMGRNA in the last 8760 hours.  Assessment and Plan:  Sarah [name] presents for repeat bone marrow biopsy.    Risks and benefits of image guided bone marrow biopsy was discussed with the patient and/or patient's family including, but not limited to bleeding, infection, damage to adjacent structures or low yield requiring additional tests.  All of the questions were answered and there is agreement to proceed.  Consent signed and in chart.    Thank you for this interesting consult.  I greatly enjoyed meeting Sarah [name] and look forward to participating in their care.  A copy of this report was sent to the requesting provider on this date.  Electronically Signed: Corrie Mckusick, DO 07/19/2019, 9:17 AM   I spent a total of  15 Minutes   in face to face in clinical consultation, greater than 50% of which was counseling/coordinating care for bone marrow biopsy.

## 2019-07-19 NOTE — Procedures (Signed)
Interventional Radiology Procedure Note  Procedure: CT guided aspirate and core biopsy of left posterior iliac bone Complications: None Recommendations: - Bedrest supine x 1 hrs - OTC's PRN  Pain - Follow biopsy results  Signed,  Heru Montz S. Avyana Puffenbarger, DO    

## 2019-07-19 NOTE — Progress Notes (Signed)
Patient clinically stable post BMB per Dr Earleen Newport, denies complaints, tolerated well. Vitals stable throughout. Alert/awake and oriented post procedure. Received Versed 3mg  along with Fentanyl 157mcg IV for procedure.report given to [place] with questions answered.

## 2019-07-21 ENCOUNTER — Other Ambulatory Visit: Payer: Self-pay

## 2019-07-21 DIAGNOSIS — M7062 Trochanteric bursitis, left hip: Secondary | ICD-10-CM | POA: Diagnosis not present

## 2019-07-21 DIAGNOSIS — M533 Sacrococcygeal disorders, not elsewhere classified: Secondary | ICD-10-CM | POA: Diagnosis not present

## 2019-07-21 DIAGNOSIS — M47816 Spondylosis without myelopathy or radiculopathy, lumbar region: Secondary | ICD-10-CM | POA: Diagnosis not present

## 2019-07-21 DIAGNOSIS — M47817 Spondylosis without myelopathy or radiculopathy, lumbosacral region: Secondary | ICD-10-CM | POA: Diagnosis not present

## 2019-07-21 DIAGNOSIS — Z79891 Long term (current) use of opiate analgesic: Secondary | ICD-10-CM | POA: Diagnosis not present

## 2019-07-21 LAB — SURGICAL PATHOLOGY

## 2019-07-22 ENCOUNTER — Inpatient Hospital Stay: Payer: Medicare PPO

## 2019-07-22 DIAGNOSIS — D649 Anemia, unspecified: Secondary | ICD-10-CM | POA: Diagnosis not present

## 2019-07-22 DIAGNOSIS — M549 Dorsalgia, unspecified: Secondary | ICD-10-CM | POA: Diagnosis not present

## 2019-07-22 DIAGNOSIS — N183 Chronic kidney disease, stage 3 unspecified: Secondary | ICD-10-CM | POA: Diagnosis not present

## 2019-07-22 DIAGNOSIS — C9001 Multiple myeloma in remission: Secondary | ICD-10-CM

## 2019-07-22 DIAGNOSIS — C9000 Multiple myeloma not having achieved remission: Secondary | ICD-10-CM | POA: Diagnosis not present

## 2019-07-22 DIAGNOSIS — I130 Hypertensive heart and chronic kidney disease with heart failure and stage 1 through stage 4 chronic kidney disease, or unspecified chronic kidney disease: Secondary | ICD-10-CM | POA: Diagnosis not present

## 2019-07-22 DIAGNOSIS — M879 Osteonecrosis, unspecified: Secondary | ICD-10-CM | POA: Diagnosis not present

## 2019-07-22 DIAGNOSIS — R5383 Other fatigue: Secondary | ICD-10-CM | POA: Diagnosis not present

## 2019-07-22 DIAGNOSIS — G8929 Other chronic pain: Secondary | ICD-10-CM | POA: Diagnosis not present

## 2019-07-22 LAB — MAGNESIUM: Magnesium: 1 mg/dL — ABNORMAL LOW (ref 1.7–2.4)

## 2019-07-22 MED ORDER — SODIUM CHLORIDE 0.9 % IV SOLN: 200.0000 mg | Freq: Once | INTRAVENOUS | Status: DC

## 2019-07-22 MED ORDER — SODIUM CHLORIDE 0.9 % IV SOLN
Freq: Once | INTRAVENOUS | Status: AC
  Filled 2019-07-22: qty 250

## 2019-07-22 MED ORDER — HEPARIN SOD (PORK) LOCK FLUSH 100 UNIT/ML IV SOLN
500.0000 [IU] | Freq: Once | INTRAVENOUS | Status: AC
  Administered 2019-07-22: 12:00:00 500 [IU] via INTRAVENOUS
  Filled 2019-07-22: qty 5

## 2019-07-22 MED ORDER — SODIUM CHLORIDE 0.9% FLUSH
10.0000 mL | INTRAVENOUS | Status: DC | PRN
  Administered 2019-07-22: 10 mL via INTRAVENOUS
  Filled 2019-07-22: qty 10

## 2019-07-22 MED ORDER — MAGNESIUM SULFATE 4 GM/100ML IV SOLN
4.0000 g | Freq: Once | INTRAVENOUS | Status: AC
  Administered 2019-07-22: 4 g via INTRAVENOUS

## 2019-07-22 MED ORDER — IRON SUCROSE 20 MG/ML IV SOLN
200.0000 mg | Freq: Once | INTRAVENOUS | Status: AC
  Administered 2019-07-22: 12:00:00 200 mg via INTRAVENOUS

## 2019-07-22 NOTE — Patient Instructions (Addendum)
Magnesium Sulfate injection What is this medicine? MAGNESIUM SULFATE (mag NEE zee um SUL fate) is an electrolyte injection commonly used to treat low magnesium levels in your blood. It is also used to prevent or control seizures in women with preeclampsia or eclampsia. This medicine may be used for other purposes; ask your health care provider or pharmacist if you have questions. What should I tell my health care provider before I take this medicine? They need to know if you have any of these conditions:  heart disease  history of irregular heart beat  kidney disease  an unusual or allergic reaction to magnesium sulfate, medicines, foods, dyes, or preservatives  pregnant or trying to get pregnant  breast-feeding How should I use this medicine? This medicine is for infusion into a vein. It is given by a health care professional in a hospital or clinic setting. Talk to your pediatrician regarding the use of this medicine in children. While this drug may be prescribed for selected conditions, precautions do apply. Overdosage: If you think you have taken too much of this medicine contact a poison control center or emergency room at once. NOTE: This medicine is only for you. Do not share this medicine with others. What if I miss a dose? This does not apply. What may interact with this medicine? This medicine may interact with the following medications:  certain medicines for anxiety or sleep  certain medicines for seizures like phenobarbital  digoxin  medicines that relax muscles for surgery  narcotic medicines for pain This list may not describe all possible interactions. Give your health care provider a list of all the medicines, herbs, non-prescription drugs, or dietary supplements you use. Also tell them if you smoke, drink alcohol, or use illegal drugs. Some items may interact with your medicine. What should I watch for while using this medicine? Your condition will be  monitored carefully while you are receiving this medicine. You may need blood work done while you are receiving this medicine. What side effects may I notice from receiving this medicine? Side effects that you should report to your doctor or health care professional as soon as possible:  allergic reactions like skin rash, itching or hives, swelling of the face, lips, or tongue  facial flushing  muscle weakness  signs and symptoms of low blood pressure like dizziness; feeling faint or lightheaded, falls; unusually weak or tired  signs and symptoms of a dangerous change in heartbeat or heart rhythm like chest pain; dizziness; fast or irregular heartbeat; palpitations; breathing problems  sweating This list may not describe all possible side effects. Call your doctor for medical advice about side effects. You may report side effects to FDA at 1-800-FDA-1088. Where should I keep my medicine? This drug is given in a hospital or clinic and will not be stored at home. Iron Sucrose injection What is this medicine? IRON SUCROSE (AHY ern SOO krohs) is an iron complex. Iron is used to make healthy red blood cells, which carry oxygen and nutrients throughout the body. This medicine is used to treat iron deficiency anemia in people with chronic kidney disease. This medicine may be used for other purposes; ask your health care provider or pharmacist if you have questions. COMMON BRAND NAME(S): Venofer What should I tell my health care provider before I take this medicine? They need to know if you have any of these conditions:  anemia not caused by low iron levels  heart disease  high levels of iron in the blood  kidney disease  liver disease  an unusual or allergic reaction to iron, other medicines, foods, dyes, or preservatives  pregnant or trying to get pregnant  breast-feeding How should I use this medicine? This medicine is for infusion into a vein. It is given by a health care  professional in a hospital or clinic setting. Talk to your pediatrician regarding the use of this medicine in children. While this drug may be prescribed for children as young as 2 years for selected conditions, precautions do apply. Overdosage: If you think you have taken too much of this medicine contact a poison control center or emergency room at once. NOTE: This medicine is only for you. Do not share this medicine with others. What if I miss a dose? It is important not to miss your dose. Call your doctor or health care professional if you are unable to keep an appointment. What may interact with this medicine? Do not take this medicine with any of the following medications:  deferoxamine  dimercaprol  other iron products This medicine may also interact with the following medications:  chloramphenicol  deferasirox This list may not describe all possible interactions. Give your health care provider a list of all the medicines, herbs, non-prescription drugs, or dietary supplements you use. Also tell them if you smoke, drink alcohol, or use illegal drugs. Some items may interact with your medicine. What should I watch for while using this medicine? Visit your doctor or healthcare professional regularly. Tell your doctor or healthcare professional if your symptoms do not start to get better or if they get worse. You may need blood work done while you are taking this medicine. You may need to follow a special diet. Talk to your doctor. Foods that contain iron include: whole grains/cereals, dried fruits, beans, or peas, leafy green vegetables, and organ meats (liver, kidney). What side effects may I notice from receiving this medicine? Side effects that you should report to your doctor or health care professional as soon as possible:  allergic reactions like skin rash, itching or hives, swelling of the face, lips, or tongue  breathing problems  changes in blood pressure  cough  fast,  irregular heartbeat  feeling faint or lightheaded, falls  fever or chills  flushing, sweating, or hot feelings  joint or muscle aches/pains  seizures  swelling of the ankles or feet  unusually weak or tired Side effects that usually do not require medical attention (report to your doctor or health care professional if they continue or are bothersome):  diarrhea  feeling achy  headache  irritation at site where injected  nausea, vomiting  stomach upset  tiredness This list may not describe all possible side effects. Call your doctor for medical advice about side effects. You may report side effects to FDA at 1-800-FDA-1088. Where should I keep my medicine? This drug is given in a hospital or clinic and will not be stored at home. NOTE: This sheet is a summary. It may not cover all possible information. If you have questions about this medicine, talk to your doctor, pharmacist, or health care provider.  2020 [employer] (2011-03-28 17:14:35) NOTE: This sheet is a summary. It may not cover all possible information. If you have questions about this medicine, talk to your doctor, pharmacist, or health care provider.  2020 [employer] (2016-01-03 12:31:42)

## 2019-07-27 ENCOUNTER — Encounter (HOSPITAL_COMMUNITY): Payer: Self-pay | Admitting: Hematology and Oncology

## 2019-07-27 DIAGNOSIS — E119 Type 2 diabetes mellitus without complications: Secondary | ICD-10-CM | POA: Diagnosis not present

## 2019-07-27 LAB — HM DIABETES EYE EXAM

## 2019-07-28 NOTE — Progress Notes (Signed)
[facility], [place]  [address] [place], [place] [address] Phone: [phone]  Fax: [phone]   Clinic Day:  07/29/2019  Referring physician: Glean Hess, MD  Chief Complaint: Sarah [name] is a 64 y.o. 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  HPI: The patient was last seen in the medical oncology clinic on 07/15/2019. At that time, she noted chronic nausea. PET scan revealed no evidence of active myeloma.  Hematocrit was 31.3, hemoglobin 10.6, MCV 87.2, platelets 123,000, WBC 4800 (ANC 3000).  Ferritin was 32 with an iron saturation of 17%.   She continued Phenergan 25 mg p.o. BID before meals prn. She was scheduled for weekly Venofer x 3.  Magnesium has remained low (1.3 on 07/15/2019 and 1.0 on 07/22/2019) requiring weekly IV supplementation.  She received Venofer on 07/15/2019 and 07/22/2019.  Bone marrow aspirate and biopsy on 07/19/2019 revealed a normocellular marrow with but increased lambda-restricted plasma cells (9% aspirate, 40% CD138 immunohistochemistry).  Findings were consistent with recurrent plasma cell myeloma.  Flow cytometry revealed no monoclonal B-cell or phenotypically aberrant T-cell population. Cytogenetics were 5, XX (normal).  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  Labs on 07/19/2019 revealed a hematocrit 33.1, hemoglobin 11.1, MCV 86.6, platelets 123,000, WBC 5700, ANC 3900.   During the interim, she feels a bit better but has had nausea over the past week.   She notes that she was seen by Dr. Elza Rafter in [place].  This is where her stem cells are located.   She saw Dr. Remo Lipps A. Dingeldein on Tuesday; she had no glaucoma. His office number is [phone]. She would like to try a scopolamine patch for her nausea.  Symptomatically, she still has some nausea but has been  feeling better since her IV iron infusion.   She stopped taking Phenergan 2 weeks ago. She had diarrhea last Wednesday (07/21/2019) with no fever.  She denies any bone pain or interval infections.   Past Medical History:  Diagnosis Date  . Abnormal stress test 02/14/2016   Overview:  Added automatically from request for surgery [id number]  . Anemia   . Anxiety   . Arthritis   . Bicuspid aortic valve   . CHF (congestive heart failure) ([place])   . CKD (chronic kidney disease) stage 3, GFR 30-59 ml/min   . Depression   . Diabetes mellitus ([place])   . Dizziness   . Fatty liver   . Frequent falls   . GERD (gastroesophageal reflux disease)   . Gout   . Heart murmur   . History of blood transfusion   . History of bone marrow transplant ([place])   . History of uterine fibroid   . Hx of cardiac catheterization 06/05/2016   Overview:  Normal coronaries 2017  . Hypertension   . Hypomagnesemia   . Multiple myeloma ([place])   . Personal history of chemotherapy   . Renal cyst     Past Surgical History:  Procedure Laterality Date  . ABDOMINAL HYSTERECTOMY    . 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 CARDIAC ELECTROPHYSIOLOGY MAPPING AND ABLATION    . CARPAL TUNNEL RELEASE Bilateral   . CHOLECYSTECTOMY  2008  . COLONOSCOPY WITH PROPOFOL N/A 05/07/2017   Procedure: COLONOSCOPY WITH PROPOFOL;  Surgeon: [name], MD;  Location: [facility] ENDOSCOPY;  Service: Gastroenterology;  Laterality: N/A;  . ESOPHAGOGASTRODUODENOSCOPY (EGD)  WITH PROPOFOL N/A 05/07/2017   Procedure: ESOPHAGOGASTRODUODENOSCOPY (EGD) WITH PROPOFOL;  Surgeon: [name], MD;  Location: [facility] ENDOSCOPY;  Service: Gastroenterology;  Laterality: N/A;  . FOOT SURGERY Bilateral   . INCONTINENCE SURGERY  2009  . other     over active bladder  . OTHER SURGICAL HISTORY     bladder stimulator   . PARTIAL HYSTERECTOMY  03/1996   fibroids  . PORTA CATH INSERTION N/A 03/10/2019   Procedure: PORTA CATH INSERTION;  Surgeon: Algernon Huxley, MD;  Location:  [place] [facility] LAB;  Service: Cardiovascular;  Laterality: N/A;  . TONSILLECTOMY  2007    Family History  Problem Relation Age of Onset  . Colon cancer Father   . Renal Disease Father   . Diabetes Mellitus II Father   . Melanoma Paternal Grandmother   . Breast cancer Maternal Aunt 20  . Anemia Mother   . Heart disease Mother   . Heart failure Mother   . Renal Disease Mother   . Congestive Heart Failure Mother   . Heart disease Maternal Uncle   . Throat cancer Maternal Uncle   . Lung cancer Maternal Uncle   . Liver disease Maternal Uncle   . Heart failure Maternal Uncle   . Hearing loss Son 57       Suicide     Social History:  reports that she quit smoking about 28 years ago. Her smoking use included cigarettes. She has a 20.00 pack-year smoking history. She has never used smokeless tobacco. She reports previous alcohol use. She reports that she does not use drugs.  Patient has not had ETOH in several months. She is on disability. She notes exposure to perchloroethylene [facility]).She lives in [place]. The patient is alone today.  Allergies:  Allergies  Allergen Reactions  . Oxycodone-Acetaminophen Anaphylaxis    Swelling and rash  . Celebrex [Celecoxib] Diarrhea  . Codeine   . Benadryl [Diphenhydramine] Palpitations  . Morphine Itching and Rash  . Ondansetron Diarrhea  . Tylenol [Acetaminophen] Itching and Rash    Current Medications: Current Outpatient Medications  Medication Sig Dispense Refill  . allopurinol (ZYLOPRIM) 100 MG tablet TAKE 1 TABLET(100 MG) BY MOUTH DAILY as needed 90 tablet 1  . bisoprolol (ZEBETA) 10 MG tablet TAKE 1 TABLET(10 MG) BY MOUTH DAILY 90 tablet 0  . diclofenac sodium (VOLTAREN) 1 % GEL Apply 2 g topically 4 (four) times daily. 100 g 1  . diphenoxylate-atropine (LOMOTIL) 2.5-0.025 MG tablet Take 2 tablets by mouth daily as needed for diarrhea or loose stools. 45 tablet 2  . fentaNYL (DURAGESIC - DOSED MCG/HR) 25 MCG/HR patch Place 25 mcg  onto the skin every 3 (three) days.     Marland Kitchen FLUoxetine (PROZAC) 40 MG capsule TAKE 1 CAPSULE EVERY DAY 90 capsule 1  . fluticasone (FLONASE) 50 MCG/ACT nasal spray Place 2 sprays into both nostrils daily. 16 g 2  . furosemide (LASIX) 20 MG tablet Take 20 mg by mouth as needed.     Marland Kitchen glucose blood (ONE TOUCH ULTRA TEST) test strip     . lisinopril (PRINIVIL,ZESTRIL) 10 MG tablet Take 10 mg by mouth daily.    . Menaquinone-7 (VITAMIN K2) 100 MCG CAPS Take 1 tablet by mouth daily.     . metFORMIN (GLUCOPHAGE-XR) 500 MG 24 hr tablet Take 1 tablet (500 mg total) by mouth daily with breakfast. 90 tablet 1  . Misc Natural Products (OSTEO BI-FLEX ADV TRIPLE ST PO) Take 2 tablets by mouth daily.    Marland Kitchen  Multiple Vitamins-Minerals (HAIR/SKIN/NAILS/BIOTIN PO) Take 1 capsule by mouth 3 (three) times daily.    . NON FORMULARY     . Omega 3-6-9 Fatty Acids (OMEGA 3-6-9 COMPLEX) CAPS Take by mouth.    Marland Kitchen omeprazole (PRILOSEC) 40 MG capsule Take 1 capsule (40 mg total) by mouth daily. 90 capsule 1  . pravastatin (PRAVACHOL) 20 MG tablet TAKE 1 TABLET BY MOUTH  DAILY 90 tablet 1  . tiZANidine (ZANAFLEX) 4 MG tablet Take 1 tablet (4 mg total) by mouth every 8 (eight) hours as needed for muscle spasms. 270 tablet 1  . traZODone (DESYREL) 100 MG tablet TAKE 1 TABLET AT BEDTIME  FOR  SLEEP 90 tablet 1  . aspirin 81 MG chewable tablet Chew 1 tablet (81 mg total) by mouth daily. (Patient not taking: Reported on 05/26/2019) 30 tablet 0  . fexofenadine (ALLEGRA) 180 MG tablet TAKE 1 TABLET(180 MG) BY MOUTH DAILY (Patient not taking: Reported on 05/26/2019) 30 tablet 5  . hydrOXYzine (ATARAX/VISTARIL) 10 MG tablet TAKE 1 TABLET(10 MG) BY MOUTH THREE TIMES DAILY AS NEEDED (Patient not taking: Reported on 05/26/2019) 90 tablet 0  . lidocaine-prilocaine (EMLA) cream APPLY TOPICALLY AS NEEDED. (Patient not taking: Reported on 05/26/2019) 30 g 0  . Magnesium Bisglycinate (MAG GLYCINATE) 100 MG TABS Take 100 mg by mouth 2 (two) times  daily. (Patient not taking: Reported on 07/19/2019) 60 tablet 0  . promethazine (PHENERGAN) 25 MG tablet Take 1 tablet (25 mg total) by mouth 2 (two) times daily as needed for nausea or vomiting. (Patient not taking: Reported on 07/28/2019) 60 tablet 0   No current facility-administered medications for this visit.   Facility-Administered Medications Ordered in Other Visits  Medication Dose Route Frequency Provider Last Rate Last Admin  . heparin lock flush 100 unit/mL  500 Units Intravenous Once Davaun Quintela C, MD      . sodium chloride flush (NS) 0.9 % injection 10 mL  10 mL Intravenous PRN Nolon Stalls C, MD   10 mL at 07/29/19 0836    Review of Systems  Constitutional: Positive for diaphoresis (night sweats), malaise/fatigue (majority of the time) and weight loss (2 pounds). Negative for chills and fever.       Feels "a little better".  HENT: Negative.  Negative for congestion, ear pain, hearing loss, nosebleeds, sinus pain and sore throat.   Eyes: Negative.  Negative for blurred vision and double vision.       No glaucoma.  Respiratory: Negative.  Negative for cough, sputum production, shortness of breath and wheezing.   Cardiovascular: Negative.  Negative for chest pain, palpitations, orthopnea, leg swelling and PND.  Gastrointestinal: Positive for heartburn and nausea (chronic s/p gallbladder surgery). Negative for abdominal pain, blood in stool, constipation, diarrhea (improved with lactulose), melena and vomiting.       Poor diet. Sensitive stomach.  Genitourinary: Negative for dysuria, frequency, hematuria and urgency.       Bladder leakage (f/u urology bladder sling).  Bladder spasms.  Musculoskeletal: Positive for back pain (chronic, spinal stenosis and pinched nerve; lower back (5/10) s/p interstim), joint pain (arthritis) and myalgias (leg cramps, worse at night). Negative for neck pain (3 pinched nerves in neck; resolved).  Skin: Negative.  Negative for rash.    Neurological: Positive for sensory change (neuropathy in hands and feet). Negative for dizziness, speech change, focal weakness, seizures, weakness and headaches.       Restless legs.  Endo/Heme/Allergies: Negative.  Does not bruise/bleed easily.  Diabetes.  Psychiatric/Behavioral: Negative for depression and memory loss. The patient has insomnia (restless legs; nausea). The patient is not nervous/anxious.   All other systems reviewed and are negative.  Performance status (ECOG):  1  Vitals Blood pressure (!) 108/53, pulse 61, temperature (!) 96.6 F (35.9 C), temperature source Tympanic, resp. rate 18, weight 149 lb 2.3 oz (67.7 kg).   Physical Exam  Constitutional: She is oriented to person, place, and time. She appears well-developed and well-nourished. No distress.  HENT:  Head: Normocephalic and atraumatic.  Right Ear: Ear canal normal.  Left Ear: Ear canal normal.  Mouth/Throat: Oropharynx is clear and moist. No oropharyngeal exudate.  Curly dark blonde hair pulled up. Mask.  Eyes: Pupils are equal, round, and reactive to light. Conjunctivae and EOM are normal. No scleral icterus.  Glasses.  Neck: No JVD present.  Cardiovascular: Normal rate, regular rhythm and normal heart sounds.  No murmur heard. Pulmonary/Chest: Effort normal and breath sounds normal. No respiratory distress. She has no wheezes. She has no rales.  Abdominal: Soft. Bowel sounds are normal. She exhibits no distension and no mass. There is no abdominal tenderness. There is no rebound and no guarding.  Musculoskeletal:        General: No edema. Normal range of motion.     Cervical back: Normal range of motion and neck supple.  Lymphadenopathy:       Head (right side): No preauricular, no posterior auricular and no occipital adenopathy present.       Head (left side): No preauricular, no posterior auricular and no occipital adenopathy present.    She has no cervical adenopathy.    She has no axillary  adenopathy.       Right: No inguinal and no supraclavicular adenopathy present.       Left: No inguinal and no supraclavicular adenopathy present.  Neurological: She is alert and oriented to person, place, and time.  Skin: Skin is warm and dry. No rash noted. She is not diaphoretic. No erythema. No pallor.  Psychiatric: She has a normal mood and affect. Her behavior is normal. Judgment and thought content normal.  Nursing note and vitals reviewed.   Infusion on 07/29/2019  Component Date Value Ref Range Status  . Magnesium 07/29/2019 1.3* 1.7 - 2.4 mg/dL Final   Performed at [facility], [address] [place], [place] [address]    Assessment:  Sarah [name] is a 64 y.o. 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 Bone marrow revealed 80% plasma cells. Lambda free light chains were 1340. She had nephrotic range proteinuria. She initially underwent induction with RVD. Revlimid maintenance was discontinued on 01/21/2017 secondary to intolerance.   Bone marrow aspirate and biopsy on 07/19/2019 revealed a normocellular marrow with but increased lambda-restricted plasma cells (9% aspirate, 40% CD138 immunohistochemistry).  Findings were consistent with recurrent plasma cell myeloma.  Flow cytometry revealed no monoclonal B-cell or phenotypically aberrant T-cell population. Cytogenetics were 90, XX (normal).  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  M-spikehas been followed: 0 on 04/02/2016 -06/23/2019; and 0.2 on 09/02/2017.  Lambda light chainshave been followed: 22.2 (ratio 0.56) on 07/03/2017, 30.8 (ratio 0.78) on 09/02/2017, 36.9 (ratio 0.40) on 10/21/2017, 37.4 (ratio 0.41) on 12/16/2017, 70.7(ratio 0.31) on 02/17/2018, 64.2 (ratio 0.27) on 04/07/2018, 78.9 (ratio 0.18) on 05/26/2018, 128.8 (ratio 0.17) on 08/06/2018, 181.5 (ratio 0.13) on 10/08/2018,  130.9 (ratio  0.13) on 10/20/2018, 160.7 (ratio 0.10)on 12/09/2018, 236.6 (ratio 0.07) on 02/01/2019, 363.6 (ratio 0.04) on 03/22/2019, 404.8 (ratio 0.04) on 04/05/2019, 420.7 (ratio 0.03) on 05/24/2019, and 573.4 (ratio 0.03) on 06/23/2019.  24 hour UPEP on 06/03/2019 revealed kappa free light chains 95.76, lambda free light chains 1,260.71, and ratio 0.08.  Bone surveyon 04/08/2016 and11/28/2018 revealed no definite lytic lesion seen in the visualized skeleton.Bone surveyon 11/19/2018 revealed no suspicious lucent lesionsand no acute bony abnormality.  PET scan on 07/12/2019 revealed no focal metabolic activity to suggest active myeloma within the skeleton. There were no lytic lesions identified on the CT portion of the exam or soft tissue plasmacytomas. There was no evidence of multiple myeloma.   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 Zometa was discontinued in 01/2017.  She has B12 deficiency. B12 was 254 on 04/09/2017, 295 on 08/20/2018, and 391 on 10/08/2018. Shewasonoral B12.She received B12 monthly (last 07/01/2019).Folate was 21.6 on 08/20/2018.  She has iron deficiency.  Ferritin was 32 on 07/01/2019.  She received Venofer on 07/15/2019 and 07/22/2019.  Symptomatically, she has chronic nausea.  She denies any bone pain or issues with infections.  Plan: 1.   Labs today: Mg.  2. Stage III multiple myeloma Sheiss/pautologous stem cell transplant in 06/2015.  She has remained off maintenance Revlimid secondary to intolerance.  M spike was 0 on 06/23/2019. Lambda light chains have increased over the last several months:   Lambda light chains were236.6(ratio 0.07)on 02/01/2019. Lambda light chains were404.8(ratio 0.04)on 04/05/2019. Lambda light chainswere573.40 (ratio 0.03) on 06/23/2019  PET scan on 07/12/2019 revealed no  metabolic bone lesions.  24 hour UPEPwithfree light chains revealedlambda free light chains 1,260.71,andratio 0.08.  Bone marrow on 07/19/2019 revealed an increased lambda-restricted plasma cells (9% aspirate, 40% CD138 immunohistochemistry).     Findings are c/w recurrent plasma cell myeloma.    Cytogenetics were 49, XX (normal).    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  Discuss plan for coordination of care with [facility] bone marrow transplant team.   Patient has stem cells in [place].   Discuss induction therapy.        3. Normocytic anemia Hematocrit33.1. Hemoglobin11.1. MCV86.6.Platelets 123,000 on 07/19/2019.  Ferritin 32 with an iron saturation of 17% and TIBC 359 on12/31/2020.  B12 248 (low) and folate 18.6 on 02/08/2019.   TSH was normal on02/20/2020   Creatinine 1.14(CrCl46.50m/minute).  Retic1.2%on 07/01/2019.  She received Venofer on 07/15/2019 and 07/22/2019.   Venofer today.   Ferritin goal 100. 4. Stage III chronic kidney disease BUN24. Creatinine1.14on 07/01/2019. Creatinine has fluctuated between 1.07-1.66 in the past 6 months. 24 hour urine with increased free light chains. Patient is followed by nephrology. 5. Hypomagnesemia, chronic Magnesium1.3today. Magnesium has fluctuated between 1.0-1.4 in the past 2 months. Magnesium sulfate 4 g IV today.  RTC weekly for labs (Mg) and +/-  IV magnesium. 6. Osteonecrosis of the jaw ONJ occurred in 01/2017 secondary to Zometa. She receives no further bisphosphonates. 7. Chronic back pain Etiology not secondary to multiple myeloma. [facility]  She is on Fentanyl patches. 8. Grade I-II peripheral  neuropathy Etiologyfelt secondary toprior chemotherapy. Continue to monitor. 9.Chronic nausea Patient is followed by Dr. AVicente Males Patient has been on Phenergan (stopped 2 weeks ago).  Obtain clearance from ophthalmology for scopolamine patches. 10.B12 deficiency B12 was 248 on 02/08/2019. She was on oral B12. B12 injections began on 02/15/2019 (last 07/01/2019). B12 today and monthly x 6.  Folate was 18.6 on 02/08/2019. Follow folate yearly. 11.  RN to call ophthalmologist, Dingeldein [phone]) regarding clearance for scopolamine patch (ie, no glaucoma). 12.   RN to contact [place] office at [facility] BMT office today for  [facility]. 13.   RTC weekly x 4 for labs (Mg) and IV Mg. 14.   RTC in 4 weeks for MD assessment, labs (CBC with with diff, CMP, Mg, FLCA) and IV Mg.  I discussed the assessment and treatment plan with the patient.  The patient was provided an opportunity to ask questions and all were answered.  The patient agreed with the plan and demonstrated an understanding of the instructions.  The patient was advised to call back if the symptoms worsen or if the condition fails to improve as anticipated.   Lequita Asal, MD, PhD    07/29/2019, 9:33 AM  I, Samul Dada, am acting as a scribe for Lequita Asal, MD.  I, [place] Mike Gip, MD, have reviewed the above documentation for accuracy and completeness, and I agree with the above.

## 2019-07-28 NOTE — Progress Notes (Signed)
Confirmed Name and DOB. Reports she was informed to stop taking Phenergan and has been having nauseated for the past two days and has not had appetite. Reprots diarrhea starting this AM (07/27/2018).

## 2019-07-29 ENCOUNTER — Telehealth: Payer: Self-pay

## 2019-07-29 ENCOUNTER — Inpatient Hospital Stay: Payer: Medicare PPO | Admitting: Hematology and Oncology

## 2019-07-29 ENCOUNTER — Other Ambulatory Visit: Payer: Self-pay

## 2019-07-29 ENCOUNTER — Inpatient Hospital Stay: Payer: Medicare PPO

## 2019-07-29 ENCOUNTER — Encounter (HOSPITAL_COMMUNITY): Payer: Self-pay | Admitting: Hematology and Oncology

## 2019-07-29 ENCOUNTER — Encounter: Payer: Self-pay | Admitting: Hematology and Oncology

## 2019-07-29 VITALS — BP 108/53 | HR 61 | Temp 96.6°F | Resp 18 | Wt 149.1 lb

## 2019-07-29 DIAGNOSIS — R110 Nausea: Secondary | ICD-10-CM

## 2019-07-29 DIAGNOSIS — D649 Anemia, unspecified: Secondary | ICD-10-CM

## 2019-07-29 DIAGNOSIS — N183 Chronic kidney disease, stage 3 unspecified: Secondary | ICD-10-CM | POA: Diagnosis not present

## 2019-07-29 DIAGNOSIS — C9000 Multiple myeloma not having achieved remission: Secondary | ICD-10-CM | POA: Diagnosis not present

## 2019-07-29 DIAGNOSIS — I130 Hypertensive heart and chronic kidney disease with heart failure and stage 1 through stage 4 chronic kidney disease, or unspecified chronic kidney disease: Secondary | ICD-10-CM | POA: Diagnosis not present

## 2019-07-29 DIAGNOSIS — E538 Deficiency of other specified B group vitamins: Secondary | ICD-10-CM

## 2019-07-29 DIAGNOSIS — M549 Dorsalgia, unspecified: Secondary | ICD-10-CM | POA: Diagnosis not present

## 2019-07-29 DIAGNOSIS — G8929 Other chronic pain: Secondary | ICD-10-CM | POA: Diagnosis not present

## 2019-07-29 DIAGNOSIS — M879 Osteonecrosis, unspecified: Secondary | ICD-10-CM | POA: Diagnosis not present

## 2019-07-29 DIAGNOSIS — C9001 Multiple myeloma in remission: Secondary | ICD-10-CM

## 2019-07-29 DIAGNOSIS — R5383 Other fatigue: Secondary | ICD-10-CM | POA: Diagnosis not present

## 2019-07-29 LAB — MAGNESIUM: Magnesium: 1.3 mg/dL — ABNORMAL LOW (ref 1.7–2.4)

## 2019-07-29 MED ORDER — MAGNESIUM SULFATE 4 GM/100ML IV SOLN
4.0000 g | Freq: Once | INTRAVENOUS | Status: AC
  Administered 2019-07-29: 4 g via INTRAVENOUS
  Filled 2019-07-29: qty 100

## 2019-07-29 MED ORDER — HEPARIN SOD (PORK) LOCK FLUSH 100 UNIT/ML IV SOLN
500.0000 [IU] | Freq: Once | INTRAVENOUS | Status: AC
  Administered 2019-07-29: 500 [IU] via INTRAVENOUS
  Filled 2019-07-29: qty 5

## 2019-07-29 MED ORDER — CYANOCOBALAMIN 1000 MCG/ML IJ SOLN
1000.0000 ug | Freq: Once | INTRAMUSCULAR | Status: AC
  Administered 2019-07-29: 1000 ug via INTRAMUSCULAR
  Filled 2019-07-29: qty 1

## 2019-07-29 MED ORDER — SODIUM CHLORIDE 0.9 % IV SOLN: 200.0000 mg | Freq: Once | INTRAVENOUS | Status: DC

## 2019-07-29 MED ORDER — SODIUM CHLORIDE 0.9 % IV SOLN
Freq: Once | INTRAVENOUS | Status: AC
  Filled 2019-07-29: qty 250

## 2019-07-29 MED ORDER — IRON SUCROSE 20 MG/ML IV SOLN
200.0000 mg | Freq: Once | INTRAVENOUS | Status: AC
  Administered 2019-07-29: 200 mg via INTRAVENOUS
  Filled 2019-07-29: qty 10

## 2019-07-29 MED ORDER — SODIUM CHLORIDE 0.9% FLUSH
10.0000 mL | INTRAVENOUS | Status: DC | PRN
  Administered 2019-07-29: 10 mL via INTRAVENOUS
  Filled 2019-07-29: qty 10

## 2019-07-29 NOTE — Progress Notes (Signed)
Pt reports nurse called yesterday for pre assessment.  States been "highly nauseated" since Tuesday, with diarrhea multiple times yesterday.

## 2019-07-29 NOTE — Telephone Encounter (Signed)
His reception report the first appointment available not until 08/23/2019. Md corcoran has been informed.

## 2019-07-29 NOTE — Telephone Encounter (Signed)
spoke with Dr Sandra Cockayne office at  [facility] to see if we can get a clerance for scopolamine patch ( no glaucoma) . Message was sent to the Dr Nurse to return the call.

## 2019-07-29 NOTE — Telephone Encounter (Signed)
spoke with Dr Sherral Hammers office to see if we could get this patient in to see the MD for a BMT evaluation. Intake forms has been faxed to the office today.

## 2019-07-30 ENCOUNTER — Encounter: Payer: Self-pay | Admitting: Internal Medicine

## 2019-07-30 MED ORDER — SCOPOLAMINE 1 MG/3DAYS TD PT72: 1.0000 | MEDICATED_PATCH | TRANSDERMAL | 1 refills | Status: DC

## 2019-08-03 ENCOUNTER — Telehealth: Payer: Self-pay

## 2019-08-03 NOTE — Telephone Encounter (Signed)
Received a call from [facility] BMT from [place] which has informed me that the next appointment with Dr Reynolds Bowl will be 08/24/2019 and if the patient agree's  she will be schedule at 10:00 AM.

## 2019-08-05 ENCOUNTER — Inpatient Hospital Stay: Payer: Medicare PPO

## 2019-08-05 ENCOUNTER — Other Ambulatory Visit: Payer: Self-pay

## 2019-08-05 ENCOUNTER — Inpatient Hospital Stay: Payer: Medicare PPO | Attending: Hematology and Oncology

## 2019-08-05 DIAGNOSIS — Z800 Family history of malignant neoplasm of digestive organs: Secondary | ICD-10-CM | POA: Insufficient documentation

## 2019-08-05 DIAGNOSIS — R110 Nausea: Secondary | ICD-10-CM | POA: Insufficient documentation

## 2019-08-05 DIAGNOSIS — Z9481 Bone marrow transplant status: Secondary | ICD-10-CM | POA: Diagnosis not present

## 2019-08-05 DIAGNOSIS — Z79899 Other long term (current) drug therapy: Secondary | ICD-10-CM | POA: Insufficient documentation

## 2019-08-05 DIAGNOSIS — Z822 Family history of deafness and hearing loss: Secondary | ICD-10-CM | POA: Insufficient documentation

## 2019-08-05 DIAGNOSIS — Z8379 Family history of other diseases of the digestive system: Secondary | ICD-10-CM | POA: Insufficient documentation

## 2019-08-05 DIAGNOSIS — G2581 Restless legs syndrome: Secondary | ICD-10-CM | POA: Diagnosis not present

## 2019-08-05 DIAGNOSIS — R809 Proteinuria, unspecified: Secondary | ICD-10-CM | POA: Insufficient documentation

## 2019-08-05 DIAGNOSIS — Z886 Allergy status to analgesic agent status: Secondary | ICD-10-CM | POA: Insufficient documentation

## 2019-08-05 DIAGNOSIS — Z801 Family history of malignant neoplasm of trachea, bronchus and lung: Secondary | ICD-10-CM | POA: Insufficient documentation

## 2019-08-05 DIAGNOSIS — Z9484 Stem cells transplant status: Secondary | ICD-10-CM | POA: Insufficient documentation

## 2019-08-05 DIAGNOSIS — M2550 Pain in unspecified joint: Secondary | ICD-10-CM | POA: Insufficient documentation

## 2019-08-05 DIAGNOSIS — M549 Dorsalgia, unspecified: Secondary | ICD-10-CM | POA: Diagnosis not present

## 2019-08-05 DIAGNOSIS — R12 Heartburn: Secondary | ICD-10-CM | POA: Insufficient documentation

## 2019-08-05 DIAGNOSIS — C9000 Multiple myeloma not having achieved remission: Secondary | ICD-10-CM | POA: Diagnosis not present

## 2019-08-05 DIAGNOSIS — E1122 Type 2 diabetes mellitus with diabetic chronic kidney disease: Secondary | ICD-10-CM | POA: Insufficient documentation

## 2019-08-05 DIAGNOSIS — D649 Anemia, unspecified: Secondary | ICD-10-CM | POA: Insufficient documentation

## 2019-08-05 DIAGNOSIS — F329 Major depressive disorder, single episode, unspecified: Secondary | ICD-10-CM | POA: Diagnosis not present

## 2019-08-05 DIAGNOSIS — Z832 Family history of diseases of the blood and blood-forming organs and certain disorders involving the immune mechanism: Secondary | ICD-10-CM | POA: Insufficient documentation

## 2019-08-05 DIAGNOSIS — N183 Chronic kidney disease, stage 3 unspecified: Secondary | ICD-10-CM | POA: Insufficient documentation

## 2019-08-05 DIAGNOSIS — R5383 Other fatigue: Secondary | ICD-10-CM | POA: Diagnosis not present

## 2019-08-05 DIAGNOSIS — Z888 Allergy status to other drugs, medicaments and biological substances status: Secondary | ICD-10-CM | POA: Insufficient documentation

## 2019-08-05 DIAGNOSIS — Z841 Family history of disorders of kidney and ureter: Secondary | ICD-10-CM | POA: Insufficient documentation

## 2019-08-05 DIAGNOSIS — E538 Deficiency of other specified B group vitamins: Secondary | ICD-10-CM | POA: Insufficient documentation

## 2019-08-05 DIAGNOSIS — Z803 Family history of malignant neoplasm of breast: Secondary | ICD-10-CM | POA: Insufficient documentation

## 2019-08-05 DIAGNOSIS — Z818 Family history of other mental and behavioral disorders: Secondary | ICD-10-CM | POA: Insufficient documentation

## 2019-08-05 DIAGNOSIS — Z808 Family history of malignant neoplasm of other organs or systems: Secondary | ICD-10-CM | POA: Insufficient documentation

## 2019-08-05 DIAGNOSIS — Z8249 Family history of ischemic heart disease and other diseases of the circulatory system: Secondary | ICD-10-CM | POA: Insufficient documentation

## 2019-08-05 DIAGNOSIS — I10 Essential (primary) hypertension: Secondary | ICD-10-CM | POA: Diagnosis not present

## 2019-08-05 DIAGNOSIS — Z885 Allergy status to narcotic agent status: Secondary | ICD-10-CM | POA: Insufficient documentation

## 2019-08-05 DIAGNOSIS — R197 Diarrhea, unspecified: Secondary | ICD-10-CM | POA: Diagnosis not present

## 2019-08-05 DIAGNOSIS — C9001 Multiple myeloma in remission: Secondary | ICD-10-CM

## 2019-08-05 DIAGNOSIS — Z87891 Personal history of nicotine dependence: Secondary | ICD-10-CM | POA: Diagnosis not present

## 2019-08-05 DIAGNOSIS — K5900 Constipation, unspecified: Secondary | ICD-10-CM | POA: Diagnosis not present

## 2019-08-05 DIAGNOSIS — R61 Generalized hyperhidrosis: Secondary | ICD-10-CM | POA: Insufficient documentation

## 2019-08-05 DIAGNOSIS — E1140 Type 2 diabetes mellitus with diabetic neuropathy, unspecified: Secondary | ICD-10-CM | POA: Insufficient documentation

## 2019-08-05 DIAGNOSIS — Z7984 Long term (current) use of oral hypoglycemic drugs: Secondary | ICD-10-CM | POA: Diagnosis not present

## 2019-08-05 DIAGNOSIS — M879 Osteonecrosis, unspecified: Secondary | ICD-10-CM | POA: Insufficient documentation

## 2019-08-05 DIAGNOSIS — G8929 Other chronic pain: Secondary | ICD-10-CM | POA: Insufficient documentation

## 2019-08-05 DIAGNOSIS — Z833 Family history of diabetes mellitus: Secondary | ICD-10-CM | POA: Insufficient documentation

## 2019-08-05 LAB — MAGNESIUM: Magnesium: 1 mg/dL — ABNORMAL LOW (ref 1.7–2.4)

## 2019-08-05 MED ORDER — SODIUM CHLORIDE 0.9 % IV SOLN
Freq: Once | INTRAVENOUS | Status: AC
  Filled 2019-08-05: qty 250

## 2019-08-05 MED ORDER — MAGNESIUM SULFATE 4 GM/100ML IV SOLN
4.0000 g | Freq: Once | INTRAVENOUS | Status: AC
  Administered 2019-08-05: 4 g via INTRAVENOUS

## 2019-08-05 MED ORDER — HEPARIN SOD (PORK) LOCK FLUSH 100 UNIT/ML IV SOLN
500.0000 [IU] | Freq: Once | INTRAVENOUS | Status: AC
  Administered 2019-08-05: 500 [IU] via INTRAVENOUS
  Filled 2019-08-05: qty 5

## 2019-08-05 MED ORDER — SODIUM CHLORIDE 0.9% FLUSH
10.0000 mL | INTRAVENOUS | Status: DC | PRN
  Administered 2019-08-05: 10 mL via INTRAVENOUS
  Filled 2019-08-05: qty 10

## 2019-08-05 NOTE — Patient Instructions (Signed)
Magnesium Sulfate injection What is this medicine? MAGNESIUM SULFATE (mag NEE zee um SUL fate) is an electrolyte injection commonly used to treat low magnesium levels in your blood. It is also used to prevent or control seizures in women with preeclampsia or eclampsia. This medicine may be used for other purposes; ask your health care provider or pharmacist if you have questions. What should I tell my health care provider before I take this medicine? They need to know if you have any of these conditions:  heart disease  history of irregular heart beat  kidney disease  an unusual or allergic reaction to magnesium sulfate, medicines, foods, dyes, or preservatives  pregnant or trying to get pregnant  breast-feeding How should I use this medicine? This medicine is for infusion into a vein. It is given by a health care professional in a hospital or clinic setting. Talk to your pediatrician regarding the use of this medicine in children. While this drug may be prescribed for selected conditions, precautions do apply. Overdosage: If you think you have taken too much of this medicine contact a poison control center or emergency room at once. NOTE: This medicine is only for you. Do not share this medicine with others. What if I miss a dose? This does not apply. What may interact with this medicine? This medicine may interact with the following medications:  certain medicines for anxiety or sleep  certain medicines for seizures like phenobarbital  digoxin  medicines that relax muscles for surgery  narcotic medicines for pain This list may not describe all possible interactions. Give your health care provider a list of all the medicines, herbs, non-prescription drugs, or dietary supplements you use. Also tell them if you smoke, drink alcohol, or use illegal drugs. Some items may interact with your medicine. What should I watch for while using this medicine? Your condition will be  monitored carefully while you are receiving this medicine. You may need blood work done while you are receiving this medicine. What side effects may I notice from receiving this medicine? Side effects that you should report to your doctor or health care professional as soon as possible:  allergic reactions like skin rash, itching or hives, swelling of the face, lips, or tongue  facial flushing  muscle weakness  signs and symptoms of low blood pressure like dizziness; feeling faint or lightheaded, falls; unusually weak or tired  signs and symptoms of a dangerous change in heartbeat or heart rhythm like chest pain; dizziness; fast or irregular heartbeat; palpitations; breathing problems  sweating This list may not describe all possible side effects. Call your doctor for medical advice about side effects. You may report side effects to FDA at 1-800-FDA-1088. Where should I keep my medicine? This drug is given in a hospital or clinic and will not be stored at home. NOTE: This sheet is a summary. It may not cover all possible information. If you have questions about this medicine, talk to your doctor, pharmacist, or health care provider.  2020 Elsevier/Gold Standard (2016-01-03 12:31:42)  

## 2019-08-11 ENCOUNTER — Other Ambulatory Visit: Payer: Self-pay

## 2019-08-12 ENCOUNTER — Inpatient Hospital Stay: Payer: Medicare PPO

## 2019-08-12 ENCOUNTER — Other Ambulatory Visit: Payer: Self-pay

## 2019-08-12 DIAGNOSIS — R110 Nausea: Secondary | ICD-10-CM | POA: Diagnosis not present

## 2019-08-12 DIAGNOSIS — M2550 Pain in unspecified joint: Secondary | ICD-10-CM | POA: Diagnosis not present

## 2019-08-12 DIAGNOSIS — C9001 Multiple myeloma in remission: Secondary | ICD-10-CM

## 2019-08-12 DIAGNOSIS — K5900 Constipation, unspecified: Secondary | ICD-10-CM | POA: Diagnosis not present

## 2019-08-12 DIAGNOSIS — R12 Heartburn: Secondary | ICD-10-CM | POA: Diagnosis not present

## 2019-08-12 DIAGNOSIS — G2581 Restless legs syndrome: Secondary | ICD-10-CM | POA: Diagnosis not present

## 2019-08-12 DIAGNOSIS — R5383 Other fatigue: Secondary | ICD-10-CM | POA: Diagnosis not present

## 2019-08-12 DIAGNOSIS — R197 Diarrhea, unspecified: Secondary | ICD-10-CM | POA: Diagnosis not present

## 2019-08-12 DIAGNOSIS — C9000 Multiple myeloma not having achieved remission: Secondary | ICD-10-CM | POA: Diagnosis not present

## 2019-08-12 DIAGNOSIS — M549 Dorsalgia, unspecified: Secondary | ICD-10-CM | POA: Diagnosis not present

## 2019-08-12 LAB — MAGNESIUM: Magnesium: 1.3 mg/dL — ABNORMAL LOW (ref 1.7–2.4)

## 2019-08-12 MED ORDER — SODIUM CHLORIDE 0.9 % IV SOLN
Freq: Once | INTRAVENOUS | Status: AC
  Filled 2019-08-12: qty 250

## 2019-08-12 MED ORDER — MAGNESIUM SULFATE 4 GM/100ML IV SOLN
4.0000 g | Freq: Once | INTRAVENOUS | Status: AC
  Administered 2019-08-12: 4 g via INTRAVENOUS

## 2019-08-12 MED ORDER — SODIUM CHLORIDE 0.9% FLUSH
10.0000 mL | INTRAVENOUS | Status: DC | PRN
  Administered 2019-08-12: 10 mL via INTRAVENOUS
  Filled 2019-08-12: qty 10

## 2019-08-12 MED ORDER — HEPARIN SOD (PORK) LOCK FLUSH 100 UNIT/ML IV SOLN
500.0000 [IU] | Freq: Once | INTRAVENOUS | Status: DC
  Filled 2019-08-12: qty 5

## 2019-08-12 NOTE — Patient Instructions (Signed)
Magnesium Sulfate injection What is this medicine? MAGNESIUM SULFATE (mag NEE zee um SUL fate) is an electrolyte injection commonly used to treat low magnesium levels in your blood. It is also used to prevent or control seizures in women with preeclampsia or eclampsia. This medicine may be used for other purposes; ask your health care provider or pharmacist if you have questions. What should I tell my health care provider before I take this medicine? They need to know if you have any of these conditions:  heart disease  history of irregular heart beat  kidney disease  an unusual or allergic reaction to magnesium sulfate, medicines, foods, dyes, or preservatives  pregnant or trying to get pregnant  breast-feeding How should I use this medicine? This medicine is for infusion into a vein. It is given by a health care professional in a hospital or clinic setting. Talk to your pediatrician regarding the use of this medicine in children. While this drug may be prescribed for selected conditions, precautions do apply. Overdosage: If you think you have taken too much of this medicine contact a poison control center or emergency room at once. NOTE: This medicine is only for you. Do not share this medicine with others. What if I miss a dose? This does not apply. What may interact with this medicine? This medicine may interact with the following medications:  certain medicines for anxiety or sleep  certain medicines for seizures like phenobarbital  digoxin  medicines that relax muscles for surgery  narcotic medicines for pain This list may not describe all possible interactions. Give your health care provider a list of all the medicines, herbs, non-prescription drugs, or dietary supplements you use. Also tell them if you smoke, drink alcohol, or use illegal drugs. Some items may interact with your medicine. What should I watch for while using this medicine? Your condition will be  monitored carefully while you are receiving this medicine. You may need blood work done while you are receiving this medicine. What side effects may I notice from receiving this medicine? Side effects that you should report to your doctor or health care professional as soon as possible:  allergic reactions like skin rash, itching or hives, swelling of the face, lips, or tongue  facial flushing  muscle weakness  signs and symptoms of low blood pressure like dizziness; feeling faint or lightheaded, falls; unusually weak or tired  signs and symptoms of a dangerous change in heartbeat or heart rhythm like chest pain; dizziness; fast or irregular heartbeat; palpitations; breathing problems  sweating This list may not describe all possible side effects. Call your doctor for medical advice about side effects. You may report side effects to FDA at 1-800-FDA-1088. Where should I keep my medicine? This drug is given in a hospital or clinic and will not be stored at home. NOTE: This sheet is a summary. It may not cover all possible information. If you have questions about this medicine, talk to your doctor, pharmacist, or health care provider.  2020 Elsevier/Gold Standard (2016-01-03 12:31:42)  

## 2019-08-13 ENCOUNTER — Other Ambulatory Visit: Payer: Self-pay | Admitting: Hematology and Oncology

## 2019-08-13 DIAGNOSIS — C9000 Multiple myeloma not having achieved remission: Secondary | ICD-10-CM

## 2019-08-18 ENCOUNTER — Telehealth: Payer: Self-pay | Admitting: *Deleted

## 2019-08-18 NOTE — Telephone Encounter (Signed)
Contacted patient to change her appointment from [place] at 8:30am to Friday at 11am.  Patient was agreeable and knows to call if she cannot make it on Friday or if she starts having any symptoms from her magnesium level being too low.

## 2019-08-19 ENCOUNTER — Inpatient Hospital Stay: Payer: Medicare PPO

## 2019-08-20 ENCOUNTER — Inpatient Hospital Stay: Payer: Medicare PPO

## 2019-08-20 ENCOUNTER — Other Ambulatory Visit: Payer: Self-pay

## 2019-08-20 ENCOUNTER — Other Ambulatory Visit: Payer: Self-pay | Admitting: Hematology and Oncology

## 2019-08-20 VITALS — BP 135/74 | HR 55 | Temp 96.1°F | Resp 18

## 2019-08-20 DIAGNOSIS — C9000 Multiple myeloma not having achieved remission: Secondary | ICD-10-CM

## 2019-08-20 DIAGNOSIS — R12 Heartburn: Secondary | ICD-10-CM | POA: Diagnosis not present

## 2019-08-20 DIAGNOSIS — C9001 Multiple myeloma in remission: Secondary | ICD-10-CM

## 2019-08-20 DIAGNOSIS — R110 Nausea: Secondary | ICD-10-CM

## 2019-08-20 DIAGNOSIS — M549 Dorsalgia, unspecified: Secondary | ICD-10-CM | POA: Diagnosis not present

## 2019-08-20 DIAGNOSIS — K5900 Constipation, unspecified: Secondary | ICD-10-CM | POA: Diagnosis not present

## 2019-08-20 DIAGNOSIS — G2581 Restless legs syndrome: Secondary | ICD-10-CM | POA: Diagnosis not present

## 2019-08-20 DIAGNOSIS — M2550 Pain in unspecified joint: Secondary | ICD-10-CM | POA: Diagnosis not present

## 2019-08-20 DIAGNOSIS — R197 Diarrhea, unspecified: Secondary | ICD-10-CM | POA: Diagnosis not present

## 2019-08-20 DIAGNOSIS — R5383 Other fatigue: Secondary | ICD-10-CM | POA: Diagnosis not present

## 2019-08-20 LAB — CBC WITH DIFFERENTIAL/PLATELET
Abs Immature Granulocytes: 0.02 K/uL (ref 0.00–0.07)
Basophils Absolute: 0 K/uL (ref 0.0–0.1)
Basophils Relative: 0 %
Eosinophils Absolute: 0.1 K/uL (ref 0.0–0.5)
Eosinophils Relative: 2 %
HCT: 32.3 % — ABNORMAL LOW (ref 36.0–46.0)
Hemoglobin: 11 g/dL — ABNORMAL LOW (ref 12.0–15.0)
Immature Granulocytes: 0 %
Lymphocytes Relative: 19 %
Lymphs Abs: 0.9 K/uL (ref 0.7–4.0)
MCH: 29.9 pg (ref 26.0–34.0)
MCHC: 34.1 g/dL (ref 30.0–36.0)
MCV: 87.8 fL (ref 80.0–100.0)
Monocytes Absolute: 0.4 K/uL (ref 0.1–1.0)
Monocytes Relative: 7 %
Neutro Abs: 3.6 K/uL (ref 1.7–7.7)
Neutrophils Relative %: 72 %
Platelets: 128 K/uL — ABNORMAL LOW (ref 150–400)
RBC: 3.68 MIL/uL — ABNORMAL LOW (ref 3.87–5.11)
RDW: 14.2 % (ref 11.5–15.5)
WBC: 5 K/uL (ref 4.0–10.5)
nRBC: 0 % (ref 0.0–0.2)

## 2019-08-20 LAB — COMPREHENSIVE METABOLIC PANEL WITH GFR
ALT: 20 U/L (ref 0–44)
AST: 22 U/L (ref 15–41)
Albumin: 4.5 g/dL (ref 3.5–5.0)
Alkaline Phosphatase: 66 U/L (ref 38–126)
Anion gap: 6 (ref 5–15)
BUN: 23 mg/dL (ref 8–23)
CO2: 24 mmol/L (ref 22–32)
Calcium: 8.6 mg/dL — ABNORMAL LOW (ref 8.9–10.3)
Chloride: 103 mmol/L (ref 98–111)
Creatinine, Ser: 1.03 mg/dL — ABNORMAL HIGH (ref 0.44–1.00)
GFR calc Af Amer: >60 mL/min
GFR calc non Af Amer: 58 mL/min — ABNORMAL LOW
Glucose, Bld: 125 mg/dL — ABNORMAL HIGH (ref 70–99)
Potassium: 4 mmol/L (ref 3.5–5.1)
Sodium: 133 mmol/L — ABNORMAL LOW (ref 135–145)
Total Bilirubin: 0.6 mg/dL (ref 0.3–1.2)
Total Protein: 6.9 g/dL (ref 6.5–8.1)

## 2019-08-20 LAB — LACTATE DEHYDROGENASE: LDH: 127 U/L (ref 98–192)

## 2019-08-20 LAB — MAGNESIUM: Magnesium: 1.1 mg/dL — ABNORMAL LOW (ref 1.7–2.4)

## 2019-08-20 MED ORDER — SODIUM CHLORIDE 0.9% FLUSH
10.0000 mL | INTRAVENOUS | Status: DC | PRN
  Administered 2019-08-20: 10 mL via INTRAVENOUS
  Filled 2019-08-20: qty 10

## 2019-08-20 MED ORDER — SODIUM CHLORIDE 0.9 % IV SOLN
Freq: Once | INTRAVENOUS | Status: AC
  Filled 2019-08-20: qty 250

## 2019-08-20 MED ORDER — HEPARIN SOD (PORK) LOCK FLUSH 100 UNIT/ML IV SOLN
500.0000 [IU] | Freq: Once | INTRAVENOUS | Status: DC
  Filled 2019-08-20: qty 5

## 2019-08-20 MED ORDER — PROMETHAZINE HCL 25 MG PO TABS: 25.0000 mg | ORAL_TABLET | Freq: Two times a day (BID) | ORAL | 0 refills | Status: DC | PRN

## 2019-08-20 MED ORDER — MAGNESIUM SULFATE 4 GM/100ML IV SOLN
4.0000 g | Freq: Once | INTRAVENOUS | Status: AC
  Administered 2019-08-20: 12:00:00 4 g via INTRAVENOUS
  Filled 2019-08-20: qty 100

## 2019-08-20 NOTE — Patient Instructions (Signed)
Magnesium Sulfate injection What is this medicine? MAGNESIUM SULFATE (mag NEE zee um SUL fate) is an electrolyte injection commonly used to treat low magnesium levels in your blood. It is also used to prevent or control seizures in women with preeclampsia or eclampsia. This medicine may be used for other purposes; ask your health care provider or pharmacist if you have questions. What should I tell my health care provider before I take this medicine? They need to know if you have any of these conditions:  heart disease  history of irregular heart beat  kidney disease  an unusual or allergic reaction to magnesium sulfate, medicines, foods, dyes, or preservatives  pregnant or trying to get pregnant  breast-feeding How should I use this medicine? This medicine is for infusion into a vein. It is given by a health care professional in a hospital or clinic setting. Talk to your pediatrician regarding the use of this medicine in children. While this drug may be prescribed for selected conditions, precautions do apply. Overdosage: If you think you have taken too much of this medicine contact a poison control center or emergency room at once. NOTE: This medicine is only for you. Do not share this medicine with others. What if I miss a dose? This does not apply. What may interact with this medicine? This medicine may interact with the following medications:  certain medicines for anxiety or sleep  certain medicines for seizures like phenobarbital  digoxin  medicines that relax muscles for surgery  narcotic medicines for pain This list may not describe all possible interactions. Give your health care provider a list of all the medicines, herbs, non-prescription drugs, or dietary supplements you use. Also tell them if you smoke, drink alcohol, or use illegal drugs. Some items may interact with your medicine. What should I watch for while using this medicine? Your condition will be  monitored carefully while you are receiving this medicine. You may need blood work done while you are receiving this medicine. What side effects may I notice from receiving this medicine? Side effects that you should report to your doctor or health care professional as soon as possible:  allergic reactions like skin rash, itching or hives, swelling of the face, lips, or tongue  facial flushing  muscle weakness  signs and symptoms of low blood pressure like dizziness; feeling faint or lightheaded, falls; unusually weak or tired  signs and symptoms of a dangerous change in heartbeat or heart rhythm like chest pain; dizziness; fast or irregular heartbeat; palpitations; breathing problems  sweating This list may not describe all possible side effects. Call your doctor for medical advice about side effects. You may report side effects to FDA at 1-800-FDA-1088. Where should I keep my medicine? This drug is given in a hospital or clinic and will not be stored at home. NOTE: This sheet is a summary. It may not cover all possible information. If you have questions about this medicine, talk to your doctor, pharmacist, or health care provider.  2020 Elsevier/Gold Standard (2016-01-03 12:31:42)  

## 2019-08-20 NOTE — Progress Notes (Signed)
Lab called to say that patient's magnesium is 1.1 today.  Patient states that she does not have much of an appetite.  She is using nausea patches and they are working for about six hours then they wear off.  MD made aware.  Labwork drawn for patient for her upcoming bone marrow office visit.

## 2019-08-21 LAB — BETA 2 MICROGLOBULIN, SERUM: Beta-2 Microglobulin: 3 mg/L — ABNORMAL HIGH (ref 0.6–2.4)

## 2019-08-23 ENCOUNTER — Other Ambulatory Visit: Payer: Self-pay

## 2019-08-23 DIAGNOSIS — M549 Dorsalgia, unspecified: Secondary | ICD-10-CM | POA: Diagnosis not present

## 2019-08-23 DIAGNOSIS — R5383 Other fatigue: Secondary | ICD-10-CM | POA: Diagnosis not present

## 2019-08-23 DIAGNOSIS — C9000 Multiple myeloma not having achieved remission: Secondary | ICD-10-CM

## 2019-08-23 DIAGNOSIS — R110 Nausea: Secondary | ICD-10-CM | POA: Diagnosis not present

## 2019-08-23 DIAGNOSIS — R197 Diarrhea, unspecified: Secondary | ICD-10-CM | POA: Diagnosis not present

## 2019-08-23 DIAGNOSIS — M2550 Pain in unspecified joint: Secondary | ICD-10-CM | POA: Diagnosis not present

## 2019-08-23 DIAGNOSIS — G2581 Restless legs syndrome: Secondary | ICD-10-CM | POA: Diagnosis not present

## 2019-08-23 DIAGNOSIS — R12 Heartburn: Secondary | ICD-10-CM | POA: Diagnosis not present

## 2019-08-23 DIAGNOSIS — K5900 Constipation, unspecified: Secondary | ICD-10-CM | POA: Diagnosis not present

## 2019-08-23 LAB — KAPPA/LAMBDA LIGHT CHAINS
Kappa free light chain: 8.7 mg/L (ref 3.3–19.4)
Kappa, lambda light chain ratio: 0.02 — ABNORMAL LOW (ref 0.26–1.65)
Lambda free light chains: 451.5 mg/L — ABNORMAL HIGH (ref 5.7–26.3)

## 2019-08-24 DIAGNOSIS — C9000 Multiple myeloma not having achieved remission: Secondary | ICD-10-CM | POA: Diagnosis not present

## 2019-08-24 LAB — UPEP/UIFE/LIGHT CHAINS/TP, 24-HR UR
% BETA, Urine: 57.9 %
ALPHA 1 URINE: 1.6 %
Albumin, U: 29.2 %
Alpha 2, Urine: 9.8 %
Free Kappa Lt Chains,Ur: 106.86 mg/L (ref 0.63–113.79)
Free Kappa/Lambda Ratio: 0.1 — ABNORMAL LOW (ref 1.03–31.76)
Free Lambda Lt Chains,Ur: 1084.16 mg/L — ABNORMAL HIGH (ref 0.47–11.77)
GAMMA GLOBULIN URINE: 1.5 %
M-SPIKE %, Urine: 46.1 % — ABNORMAL HIGH
M-Spike, Mg/24 Hr: 361 mg/24 hr — ABNORMAL HIGH
Total Protein, Urine-Ur/day: 782 mg/24 hr — ABNORMAL HIGH (ref 30–150)
Total Protein, Urine: 86.9 mg/dL
Total Volume: 900

## 2019-08-24 LAB — UPEP/TP, 24-HR URINE
Albumin, U: 28 %
Alpha 1, Urine: 1.6 %
Alpha 2, Urine: 8.7 %
Beta, Urine: 58.6 %
Gamma Globulin, Urine: 3.1 %
M-Spike, mg/24 hr: 372.4 mg/24 hr — ABNORMAL HIGH
M-spike, %: 48.8 % — ABNORMAL HIGH
Total Protein, Urine-Ur/day: 763 mg/24 hr — ABNORMAL HIGH (ref 30–150)
Total Protein, Urine: 84.8 mg/dL
Total Volume: 900

## 2019-08-24 NOTE — Progress Notes (Signed)
[facility]  [address] [place], [place] [address] Phone: [phone]  Fax: [phone]   Clinic Day:  08/26/2019  Referring physician: Glean Hess, MD   Chief Complaint: Sarah [name] is a 64 y.o. 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  HPI: The patient was last seen in the medical oncology clinic on 07/29/2019. At that time, she had chronic nausea. She denied any bone pain or issues with infections. Magnesium was 1.3. Patient received Venofer and magnesium sulfate 4 g. She was given a B12 injection.   Patient received magnesium sulfate 4 gm IV on 08/05/2019, 02/11/20201, 08/20/2019.   Magnesium has ranged between 1.0 - 1.3.  Patient reported nausea and poor appetite to the infusion nurses on 08/20/2019. She was using the scopolamine patch.  Patch would work temporarily for 6 hours.  She requested Phenergan.  She was seen by Dr. Sherral Hammers at [facility] on 08/24/2019 for a new patient consult. She felt fatigued. 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 She was independent in her ADLs.  He recommended daratumumab containing triplet therapy but would investigate clinical trial options. Patient is a reasonable candidate for second autoHCT following resumption of disease control. Patient will decide and get back to them.  CBC on 08/20/2019 revealed a hematocrit 32.3, hemoglobin 11.0, platelets 128,000, WBC 5,000. Sodium 133. Creatinine 1.03. Calcium 8.6. LDH 127. Beta-2 microglobulin was 3.0. Kappa free light chains were 8.7, lambda free light chains 451.5, and ratio 0.02.   24 hour urine UPEP on 08/23/2019 revealed total protein of 782 mg/24 hrs with free lambda light chains 1,084.16 mg/L and kappa/lambda ratio of 0.10 (1.03-31.76).  M spike in urine was 46.1% (361 mg/24 hrs).   During the interim, she has  felt "ok".  She has nausea. She denies any infections. She wakes up at night time sweating. Her heartburn is controlled with medication. She is not eating well and she tries to eat 1 "good" meal a day.   She is not on lactulose secondary to watery stool x 1 month. She continues to have back pain, leg cramps, and joint pain. She notes constantly dropping objects. Her grip is poor. She feels some stinging pain in her feet. Her restless legs feels slightly better.   I advised patient to restart calcium supplementation. Calcium is 8.6.    Past Medical History:  Diagnosis Date  . Abnormal stress test 02/14/2016   Overview:  Added automatically from request for surgery [id number]  . Anemia   . Anxiety   . Arthritis   . Bicuspid aortic valve   . CHF (congestive heart failure) ([place])   . CKD (chronic kidney disease) stage 3, GFR 30-59 ml/min   . Depression   . Diabetes mellitus ([place])   . Dizziness   . Fatty liver   . Frequent falls   . GERD (gastroesophageal reflux disease)   . Gout   . Heart murmur   . History of blood transfusion   . History of bone marrow transplant ([place])   . History of uterine fibroid   . Hx of cardiac catheterization 06/05/2016   Overview:  Normal coronaries 2017  . Hypertension   . Hypomagnesemia   . Multiple myeloma ([place])   . Personal history of chemotherapy   . Renal cyst     Past Surgical History:  Procedure Laterality Date  .  ABDOMINAL HYSTERECTOMY    . 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 CARDIAC ELECTROPHYSIOLOGY MAPPING AND ABLATION    . CARPAL TUNNEL RELEASE Bilateral   . CHOLECYSTECTOMY  2008  . COLONOSCOPY WITH PROPOFOL N/A 05/07/2017   Procedure: COLONOSCOPY WITH PROPOFOL;  Surgeon: [name], MD;  Location: [facility] ENDOSCOPY;  Service: Gastroenterology;  Laterality: N/A;  . ESOPHAGOGASTRODUODENOSCOPY (EGD) WITH PROPOFOL N/A 05/07/2017   Procedure: ESOPHAGOGASTRODUODENOSCOPY (EGD) WITH PROPOFOL;  Surgeon: [name], MD;  Location[facility] ENDOSCOPY;   Service: Gastroenterology;  Laterality: N/A;  . FOOT SURGERY Bilateral   . INCONTINENCE SURGERY  2009  . other     over active bladder  . OTHER SURGICAL HISTORY     bladder stimulator   . PARTIAL HYSTERECTOMY  03/1996   fibroids  . PORTA CATH INSERTION N/A 03/10/2019   Procedure: PORTA CATH INSERTION;  Surgeon: Algernon Huxley, MD;  Location: [place] CV LAB;  Service: Cardiovascular;  Laterality: N/A;  . TONSILLECTOMY  2007    Family History  Problem Relation Age of Onset  . Colon cancer Father   . Renal Disease Father   . Diabetes Mellitus II Father   . Melanoma Paternal Grandmother   . Breast cancer Maternal Aunt 14  . Anemia Mother   . Heart disease Mother   . Heart failure Mother   . Renal Disease Mother   . Congestive Heart Failure Mother   . Heart disease Maternal Uncle   . Throat cancer Maternal Uncle   . Lung cancer Maternal Uncle   . Liver disease Maternal Uncle   . Heart failure Maternal Uncle   . Hearing loss Son 65       Suicide     Social History:  reports that she quit smoking about 28 years ago. Her smoking use included cigarettes. She has a 20.00 pack-year smoking history. She has never used smokeless tobacco. She reports previous alcohol use. She reports that she does not use drugs. Patient has not had ETOH in several months. She is on disability. She notes exposure to perchloroethylene [facility]).She lives much of her adult life in [place].  She was married with 2 sons.  Her husband passed away.  Her 2 sons took their own lives (age 105 and 48).  She worked in [employer] in [employer].  She went back to school and earned an [facility] [employer] [facility] and works for [employer] for several years. She lives with her sisters in [place]. The patient is alone today.  Allergies:  Allergies  Allergen Reactions  . Oxycodone-Acetaminophen Anaphylaxis    Swelling and rash  . Celebrex [Celecoxib] Diarrhea  . Codeine   . Benadryl [Diphenhydramine] Palpitations  .  Morphine Itching and Rash  . Ondansetron Diarrhea  . Tylenol [Acetaminophen] Itching and Rash    Current Medications: Current Outpatient Medications  Medication Sig Dispense Refill  . allopurinol (ZYLOPRIM) 100 MG tablet TAKE 1 TABLET(100 MG) BY MOUTH DAILY as needed 90 tablet 1  . bisoprolol (ZEBETA) 10 MG tablet TAKE 1 TABLET(10 MG) BY MOUTH DAILY 90 tablet 0  . fentaNYL (DURAGESIC - DOSED MCG/HR) 25 MCG/HR patch Place 25 mcg onto the skin every 3 (three) days.     Marland Kitchen FLUoxetine (PROZAC) 40 MG capsule TAKE 1 CAPSULE EVERY DAY 90 capsule 1  . furosemide (LASIX) 20 MG tablet Take 20 mg by mouth as needed.     Marland Kitchen glucose blood (ONE TOUCH ULTRA TEST) test strip     . lisinopril (PRINIVIL,ZESTRIL) 10  MG tablet Take 10 mg by mouth daily.    . metFORMIN (GLUCOPHAGE-XR) 500 MG 24 hr tablet Take 1 tablet (500 mg total) by mouth daily with breakfast. 90 tablet 1  . Multiple Vitamins-Minerals (HAIR/SKIN/NAILS/BIOTIN PO) Take 1 capsule by mouth 3 (three) times daily.    Marland Kitchen omeprazole (PRILOSEC) 40 MG capsule Take 1 capsule (40 mg total) by mouth daily. 90 capsule 1  . pravastatin (PRAVACHOL) 20 MG tablet TAKE 1 TABLET BY MOUTH  DAILY 90 tablet 1  . promethazine (PHENERGAN) 25 MG tablet Take 1 tablet (25 mg total) by mouth 2 (two) times daily as needed for nausea or vomiting. 60 tablet 0  . tiZANidine (ZANAFLEX) 4 MG tablet Take 1 tablet (4 mg total) by mouth every 8 (eight) hours as needed for muscle spasms. 270 tablet 1  . traZODone (DESYREL) 100 MG tablet TAKE 1 TABLET AT BEDTIME  FOR  SLEEP 90 tablet 1  . aspirin 81 MG chewable tablet Chew 1 tablet (81 mg total) by mouth daily. (Patient not taking: Reported on 05/26/2019) 30 tablet 0  . diclofenac sodium (VOLTAREN) 1 % GEL Apply 2 g topically 4 (four) times daily. (Patient not taking: Reported on 08/25/2019) 100 g 1  . diphenoxylate-atropine (LOMOTIL) 2.5-0.025 MG tablet Take 2 tablets by mouth daily as needed for diarrhea or loose stools. (Patient not  taking: Reported on 08/25/2019) 45 tablet 2  . fexofenadine (ALLEGRA) 180 MG tablet TAKE 1 TABLET(180 MG) BY MOUTH DAILY (Patient not taking: Reported on 05/26/2019) 30 tablet 5  . fluticasone (FLONASE) 50 MCG/ACT nasal spray Place 2 sprays into both nostrils daily. (Patient not taking: Reported on 08/25/2019) 16 g 2  . hydrOXYzine (ATARAX/VISTARIL) 10 MG tablet TAKE 1 TABLET(10 MG) BY MOUTH THREE TIMES DAILY AS NEEDED (Patient not taking: Reported on 05/26/2019) 90 tablet 0  . lidocaine-prilocaine (EMLA) cream APPLY TOPICALLY AS NEEDED. (Patient not taking: Reported on 05/26/2019) 30 g 0  . Magnesium Bisglycinate (MAG GLYCINATE) 100 MG TABS Take 100 mg by mouth 2 (two) times daily. (Patient not taking: Reported on 07/19/2019) 60 tablet 0  . Menaquinone-7 (VITAMIN K2) 100 MCG CAPS Take 1 tablet by mouth daily.     . Misc Natural Products (OSTEO BI-FLEX ADV TRIPLE ST PO) Take 2 tablets by mouth daily.    Marland Kitchen NARCAN 4 MG/0.1ML LIQD nasal spray kit 1 spray.    . NON FORMULARY     . Omega 3-6-9 Fatty Acids (OMEGA 3-6-9 COMPLEX) CAPS Take by mouth.     No current facility-administered medications for this visit.   Facility-Administered Medications Ordered in Other Visits  Medication Dose Route Frequency Provider Last Rate Last Admin  . heparin lock flush 100 unit/mL  500 Units Intravenous Once Morrissa Shein C, MD      . magnesium sulfate IVPB 4 g 100 mL  4 g Intravenous Once Nolon Stalls C, MD 50 mL/hr at 08/26/19 1004 4 g at 08/26/19 1004  . sodium chloride flush (NS) 0.9 % injection 10 mL  10 mL Intravenous PRN Lequita Asal, MD   10 mL at 08/26/19 0919    Review of Systems  Constitutional: Positive for diaphoresis (night sweats). Negative for chills, fever, malaise/fatigue (majority of the time) and weight loss (stable).       Feels "ok".  HENT: Negative.  Negative for congestion, ear pain, hearing loss, nosebleeds, sinus pain and sore throat.   Eyes: Negative.  Negative for blurred  vision and double vision.  No glaucoma.  Respiratory: Negative.  Negative for cough, sputum production, shortness of breath and wheezing.   Cardiovascular: Negative.  Negative for chest pain, palpitations, orthopnea, leg swelling and PND.  Gastrointestinal: Positive for diarrhea (watery stool x 1 month), heartburn (controlled on medication) and nausea (chronic s/p gallbladder surgery). Negative for abdominal pain, blood in stool, constipation, melena and vomiting.       Not eating well. Sensitive stomach.  Eating 1 "good" meal a day.  Genitourinary: Negative for dysuria, frequency, hematuria and urgency.       Bladder leakage (f/u urology bladder sling).  Bladder spasms.  Musculoskeletal: Positive for back pain (chronic, spinal stenosis and pinched nerve; lower back (5/10) s/p interstim), joint pain (arthritis) and myalgias (leg cramps, worse at night). Negative for neck pain (3 pinched nerves in neck; resolved).  Skin: Negative.  Negative for rash.  Neurological: Positive for sensory change (neuropathy in hands and feet; stinging in feet). Negative for dizziness, speech change, focal weakness, seizures, weakness and headaches.       Restless legs; slightly better. Poor grip. Dropping objects.  Endo/Heme/Allergies: Negative.  Does not bruise/bleed easily.       Diabetes.  Psychiatric/Behavioral: Negative.  Negative for depression and memory loss. The patient is not nervous/anxious and does not have insomnia.   All other systems reviewed and are negative.  Performance status (ECOG): 1  Vitals Weight 151 lb 3.8 oz (68.6 kg).   Physical Exam  Constitutional: She is oriented to person, place, and time. She appears well-developed and well-nourished. No distress.  HENT:  Head: Normocephalic and atraumatic.  Right Ear: Ear canal normal.  Left Ear: Ear canal normal.  Mouth/Throat: Oropharynx is clear and moist. No oropharyngeal exudate.  Curly dark blonde hair pulled up. Mask.  Eyes:  Pupils are equal, round, and reactive to light. Conjunctivae and EOM are normal. No scleral icterus.  Glasses. Brown eyes.  Neck: No JVD present.  Cardiovascular: Normal rate, regular rhythm and normal heart sounds.  No murmur heard. Pulmonary/Chest: Effort normal and breath sounds normal. No respiratory distress. She has no wheezes. She has no rales.  Abdominal: Soft. Bowel sounds are normal. She exhibits no distension and no mass. There is no abdominal tenderness. There is no rebound and no guarding.  Musculoskeletal:        General: No edema. Normal range of motion.     Cervical back: Normal range of motion and neck supple.  Lymphadenopathy:       Head (right side): No preauricular, no posterior auricular and no occipital adenopathy present.       Head (left side): No preauricular, no posterior auricular and no occipital adenopathy present.    She has no cervical adenopathy.    She has no axillary adenopathy.       Right: No inguinal and no supraclavicular adenopathy present.       Left: No inguinal and no supraclavicular adenopathy present.  Neurological: She is alert and oriented to person, place, and time.  Skin: Skin is warm and dry. No rash noted. She is not diaphoretic. No erythema. No pallor.  Psychiatric: She has a normal mood and affect. Her behavior is normal. Judgment and thought content normal.  Nursing note and vitals reviewed.   Infusion on 08/26/2019  Component Date Value Ref Range Status  . Magnesium 08/26/2019 1.3* 1.7 - 2.4 mg/dL Final   Performed at [facility], [address], [place], [place] [address]    Assessment:  Sarah [name]  is a 63 y.o. 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 Bone marrow revealed 80% plasma cells. Lambda free light chains were 1340. She had nephrotic range proteinuria. She initially underwent induction with RVD.  Revlimid maintenance was discontinued on 01/21/2017 secondary to intolerance.   Bone marrow aspirate and biopsy on 07/19/2019 revealed a normocellular marrow with but increased lambda-restricted plasma cells (9% aspirate, 40% CD138 immunohistochemistry).  Findings were consistent with recurrent plasma cell myeloma.  Flow cytometry revealed no monoclonal B-cell or phenotypically aberrant T-cell population. Cytogenetics were 80, XX (normal).  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  M-spikehas been followed: 0 on 04/02/2016 -06/23/2019; and0.2 on 09/02/2017.  Lambda light chainshave been followed: 22.2 (ratio 0.56) on 07/03/2017, 30.8 (ratio 0.78) on 09/02/2017, 36.9 (ratio 0.40) on 10/21/2017, 37.4 (ratio 0.41) on 12/16/2017, 70.7(ratio 0.31) on 02/17/2018, 64.2 (ratio 0.27) on 04/07/2018, 78.9 (ratio 0.18) on 05/26/2018, 128.8 (ratio 0.17) on 08/06/2018, 181.5 (ratio 0.13) on 10/08/2018, 130.9 (ratio 0.13) on 10/20/2018, 160.7 (ratio 0.10)on 12/09/2018, 236.6 (ratio 0.07) on 02/01/2019, 363.6 (ratio 0.04) on 03/22/2019, 404.8 (ratio 0.04) on 04/05/2019, 420.7 (ratio 0.03) on 05/24/2019, and 573.4 (ratio 0.03) on 06/23/2019.  24 hour UPEPon 06/03/2019 revealed kappa free light chains95.76,lambda free light chains1,260.71, andratio 0.08.  24 hour UPEP on 08/23/2019 revealed total protein of 782 mg/24 hrs with lambda free light chains 1,084.16 mg/L and ratio of 0.10 (1.03-31.76).  M spike in urine was 46.1% (361 mg/24 hrs).   Bone surveyon 04/08/2016 and11/28/2018 revealed no definite lytic lesion seen in the visualized skeleton.Bone surveyon 11/19/2018 revealed no suspicious lucent lesionsand no acute bony abnormality.  PET scan on 07/12/2019 revealed no focal metabolic activity to suggest active myeloma within the skeleton. There wereno lytic lesions identified on the CT portion of the examorsoft tissue plasmacytomas. There was no evidence of multiple  myeloma.  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 Zometa was discontinued in 01/2017.  She has B12 deficiency. B12 was 254 on 04/09/2017, 295 on 08/20/2018, and 391 on 10/08/2018. Shewasonoral B12.She received B12 monthly (last12/31/2020).Folate was 21.6 on 08/20/2018.  She has iron deficiency.  Ferritin was 32 on 07/01/2019.  She received Venofer on 07/15/2019 and 07/22/2019.  Symptomatically, she feels "ok".  She remains fatigued.  She has chronic nausea.  Exam is stable.  Plan: 1.   Review labs from 08/20/2019 and 07/23/2019. 2.   Labs today: Mg. 3. Stage III multiple myeloma Sheiss/pautologous stem cell transplant in 06/2015.  She has remained off maintenance Revlimid secondary to intolerance.  M spikewas0 on 06/23/2019. Lambda light chains have increased over the last several months:                         Lambda light chains were236.6(ratio 0.07)on 02/01/2019. Lambda light chains were404.8(ratio 0.04)on 04/05/2019. Lambda light chainswere573.40 (ratio 0.03) on 06/23/2019.   Lambda light chainswere451.05 (ratio 0.02) on 08/20/2019.             PET scan on 07/12/2019 revealed no metabolic bone lesions.             24 hour UPEPwithfree light chains revealedlambda free light chains 1,260.71,andratio 0.08.             Bone marrow on 07/19/2019 revealed an increased lambda-restricted plasma cells (9% aspirate, 40% CD138 immunohistochemistry).  Findings are c/w recurrent plasma cell myeloma.                          Cytogenetics were 82, XX (normal).                          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             Discuss interval consult with [facility] bone marrow transplant team.                         Patient has stem cells in [place].                         Discuss clinical  trial vs daratumumab triplet therapy.  Discuss daratumumab based triplet therapy   Daratumumab + Revlimid and dexamethasone (DRd).   Daratumumab + Velvade and dexamethasone (DVd).   Daratumumab + Pomalyst and dexamethasone (DPd).   Daratumumab + Kyprolis and dexamethasone (DKd).   Information provided.  Patient to read about proposed medications.  Patient has residual neuropathy.   Neuropathy associated with Pomalyst (21%), Velcade SQ (37%), Kyprolis (14%).  Patient notes prior intolerance to Revlimid.  Phone follow-up with Dr Clydene Laming. 4. Normocytic anemia Hematocrit32.3. Hemoglobin11.0. MCV87.8.Platelets128,000 on 08/20/2019.             Ferritin32with an iron saturation of 17% and TIBC359on12/31/2020.             B12 248 (low) and folate 18.6on 02/08/2019.              TSH was normal on02/20/2020              Creatinine 1.14(CrCl46.34m/minute).             Retic1.2%on12/31/2020.             She received Venofer weekly x 3 (07/15/2019 - 07/29/2019).                         Ferritin goal 100.  Continue to monitor. 5. Stage III chronic kidney disease BUN23. Creatinine1.03on 08/20/2019. Creatinine has fluctuated between 1.07-1.66 in the past 6 months. 24 hour urinewith increasedfree light chains. She is followed by nephrology 6. Hypomagnesemia, chronic Magnesium1.3 today. Magnesium has fluctuated between 1.0-1.4 in the past 2 months. Magnesium sulfate 4 g IV today.  RTC weekly for labs (Mg) and +/-  IV magnesium. 7. Osteonecrosis of the jaw ONJ occurred in 01/2017 secondary to Zometa. She receives no further bisphosphonates. 8. Chronic back pain Etiologynot secondary to multiple myeloma. [facility]             She is on Fentanyl patches. 9. Grade I-II peripheral neuropathy Etiologyfelt secondary toprior chemotherapy.  Avoid chemotherapy with high risk of neuropathy. Continue to monitor. 10.Chronic nausea Patient is followed by Dr. AVicente Males Scopolamine patches were minimally effective.  Continue Phenergan. 11.B12 deficiency B12 was 248 on 02/08/2019. She was on oral B12. B12 injections began on 02/15/2019 (last 07/29/2019). Continue B12 monthly x 6. Folate was18.6on 02/08/2019. Check folate yearly. 12.  Hypocalcemia  Calcium 8.6.  Discuss calcium supplementation. 13.   RTC weekly x 4 for labs (Mg) and IV Mg. 14.   RTC in 4 weeks for MD assessment, labs (CBC with with diff, CMP, Mg, FLCA) and IV  Mg.  I discussed the assessment and treatment plan with the patient.  The patient was provided an opportunity to ask questions and all were answered.  The patient agreed with the plan and demonstrated an understanding of the instructions.  The patient was advised to call back if the symptoms worsen or if the condition fails to improve as anticipated.  I provided 19 minutes of face-to-face time during this this encounter and > 50% was spent counseling as documented under my assessment and plan.  An additional 15 minutes were spent reviewing her chart (Epic and Care Everywhere) including notes, labs, and imaging studies.  Dr Madelin Rear office and Eyvonne Left were contacted.    Lequita Asal, MD, PhD    08/26/2019, 10:23 AM  I, Selena Batten, am acting as scribe for [employer]. Mike Gip, MD, PhD.  I, Tyvon Eggenberger C. Mike Gip, MD, have reviewed the above documentation for accuracy and completeness, and I agree with the above.

## 2019-08-25 ENCOUNTER — Encounter: Payer: Self-pay | Admitting: Hematology and Oncology

## 2019-08-25 ENCOUNTER — Other Ambulatory Visit: Payer: Self-pay

## 2019-08-25 NOTE — Progress Notes (Signed)
Confirmed patient name and DOB patient has no questions or concerns at this time

## 2019-08-26 ENCOUNTER — Inpatient Hospital Stay: Payer: Medicare PPO

## 2019-08-26 ENCOUNTER — Inpatient Hospital Stay (HOSPITAL_BASED_OUTPATIENT_CLINIC_OR_DEPARTMENT_OTHER): Payer: Medicare PPO | Admitting: Hematology and Oncology

## 2019-08-26 ENCOUNTER — Encounter: Payer: Self-pay | Admitting: Hematology and Oncology

## 2019-08-26 VITALS — Wt 151.2 lb

## 2019-08-26 DIAGNOSIS — G2581 Restless legs syndrome: Secondary | ICD-10-CM | POA: Diagnosis not present

## 2019-08-26 DIAGNOSIS — C9000 Multiple myeloma not having achieved remission: Secondary | ICD-10-CM | POA: Diagnosis not present

## 2019-08-26 DIAGNOSIS — Z7189 Other specified counseling: Secondary | ICD-10-CM | POA: Diagnosis not present

## 2019-08-26 DIAGNOSIS — E538 Deficiency of other specified B group vitamins: Secondary | ICD-10-CM | POA: Diagnosis not present

## 2019-08-26 DIAGNOSIS — R110 Nausea: Secondary | ICD-10-CM

## 2019-08-26 DIAGNOSIS — D649 Anemia, unspecified: Secondary | ICD-10-CM | POA: Diagnosis not present

## 2019-08-26 DIAGNOSIS — T451X5A Adverse effect of antineoplastic and immunosuppressive drugs, initial encounter: Secondary | ICD-10-CM | POA: Diagnosis not present

## 2019-08-26 DIAGNOSIS — M2550 Pain in unspecified joint: Secondary | ICD-10-CM | POA: Diagnosis not present

## 2019-08-26 DIAGNOSIS — C9001 Multiple myeloma in remission: Secondary | ICD-10-CM

## 2019-08-26 DIAGNOSIS — M549 Dorsalgia, unspecified: Secondary | ICD-10-CM | POA: Diagnosis not present

## 2019-08-26 DIAGNOSIS — R12 Heartburn: Secondary | ICD-10-CM | POA: Diagnosis not present

## 2019-08-26 DIAGNOSIS — G620 Drug-induced polyneuropathy: Secondary | ICD-10-CM

## 2019-08-26 DIAGNOSIS — K5900 Constipation, unspecified: Secondary | ICD-10-CM | POA: Diagnosis not present

## 2019-08-26 DIAGNOSIS — R5383 Other fatigue: Secondary | ICD-10-CM | POA: Diagnosis not present

## 2019-08-26 DIAGNOSIS — R197 Diarrhea, unspecified: Secondary | ICD-10-CM | POA: Diagnosis not present

## 2019-08-26 LAB — MAGNESIUM: Magnesium: 1.3 mg/dL — ABNORMAL LOW (ref 1.7–2.4)

## 2019-08-26 MED ORDER — HEPARIN SOD (PORK) LOCK FLUSH 100 UNIT/ML IV SOLN
500.0000 [IU] | Freq: Once | INTRAVENOUS | Status: AC
  Administered 2019-08-26: 12:00:00 500 [IU] via INTRAVENOUS
  Filled 2019-08-26: qty 5

## 2019-08-26 MED ORDER — SODIUM CHLORIDE 0.9% FLUSH
10.0000 mL | INTRAVENOUS | Status: DC | PRN
  Administered 2019-08-26: 09:00:00 10 mL via INTRAVENOUS
  Filled 2019-08-26: qty 10

## 2019-08-26 MED ORDER — SODIUM CHLORIDE 0.9 % IV SOLN
Freq: Once | INTRAVENOUS | Status: AC
  Filled 2019-08-26: qty 250

## 2019-08-26 MED ORDER — MAGNESIUM SULFATE 4 GM/100ML IV SOLN
4.0000 g | Freq: Once | INTRAVENOUS | Status: AC
  Administered 2019-08-26: 10:00:00 4 g via INTRAVENOUS

## 2019-08-26 NOTE — Patient Instructions (Signed)
Daratumumab injection What is this medicine? DARATUMUMAB (dar a toom ue mab) is a monoclonal antibody. It is used to treat multiple myeloma. This medicine may be used for other purposes; ask your health care provider or pharmacist if you have questions. COMMON BRAND NAME(S): DARZALEX What should I tell my health care provider before I take this medicine? They need to know if you have any of these conditions:  infection (especially a virus infection such as chickenpox, herpes, or hepatitis B virus)  lung or breathing disease  an unusual or allergic reaction to daratumumab, other medicines, foods, dyes, or preservatives  pregnant or trying to get pregnant  breast-feeding How should I use this medicine? This medicine is for infusion into a vein. It is given by a health care professional in a hospital or clinic setting. Talk to your pediatrician regarding the use of this medicine in children. Special care may be needed. Overdosage: If you think you have taken too much of this medicine contact a poison control center or emergency room at once. NOTE: This medicine is only for you. Do not share this medicine with others. What if I miss a dose? Keep appointments for follow-up doses as directed. It is important not to miss your dose. Call your doctor or health care professional if you are unable to keep an appointment. What may interact with this medicine? Interactions have not been studied. This list may not describe all possible interactions. Give your health care provider a list of all the medicines, herbs, non-prescription drugs, or dietary supplements you use. Also tell them if you smoke, drink alcohol, or use illegal drugs. Some items may interact with your medicine. What should I watch for while using this medicine? This drug may make you feel generally unwell. Report any side effects. Continue your course of treatment even though you feel ill unless your doctor tells you to stop. This  medicine can cause serious allergic reactions. To reduce your risk you may need to take medicine before treatment with this medicine. Take your medicine as directed. This medicine can affect the results of blood tests to match your blood type. These changes can last for up to 6 months after the final dose. Your healthcare provider will do blood tests to match your blood type before you start treatment. Tell all of your healthcare providers that you are being treated with this medicine before receiving a blood transfusion. This medicine can affect the results of some tests used to determine treatment response; extra tests may be needed to evaluate response. Do not become pregnant while taking this medicine or for 3 months after stopping it. Women should inform their doctor if they wish to become pregnant or think they might be pregnant. There is a potential for serious side effects to an unborn child. Talk to your health care professional or pharmacist for more information. What side effects may I notice from receiving this medicine? Side effects that you should report to your doctor or health care professional as soon as possible:  allergic reactions like skin rash, itching or hives, swelling of the face, lips, or tongue  breathing problems  chills  cough  dizziness  feeling faint or lightheaded  headache  low blood counts - this medicine may decrease the number of white blood cells, red blood cells and platelets. You may be at increased risk for infections and bleeding.  nausea, vomiting  shortness of breath  signs of decreased platelets or bleeding - bruising, pinpoint red spots on  the skin, black, tarry stools, blood in the urine  signs of decreased red blood cells - unusually weak or tired, feeling faint or lightheaded, falls  signs of infection - fever or chills, cough, sore throat, pain or difficulty passing urine  signs and symptoms of liver injury like dark yellow or brown  urine; general ill feeling or flu-like symptoms; light-colored stools; loss of appetite; right upper belly pain; unusually weak or tired; yellowing of the eyes or skin Side effects that usually do not require medical attention (report to your doctor or health care professional if they continue or are bothersome):  back pain  constipation  diarrhea  joint pain  muscle cramps  pain, tingling, numbness in the hands or feet  swelling of the ankles, feet, hands  tiredness  trouble sleeping This list may not describe all possible side effects. Call your doctor for medical advice about side effects. You may report side effects to FDA at 1-800-FDA-1088. Where should I keep my medicine? This drug is given in a hospital or clinic and will not be stored at home. NOTE: This sheet is a summary. It may not cover all possible information. If you have questions about this medicine, talk to your doctor, pharmacist, or health care provider.  2020 Elsevier/Gold Standard (2019-02-23 18:10:54)   Dexamethasone tablets What is this medicine? DEXAMETHASONE (dex a METH a sone) is a corticosteroid. It is commonly used to treat inflammation of the skin, joints, lungs, and other organs. Common conditions treated include asthma, allergies, and arthritis. It is also used for other conditions, such as blood disorders and diseases of the adrenal glands. This medicine may be used for other purposes; ask your health care provider or pharmacist if you have questions. COMMON BRAND NAME(S): CUSHINGS SYNDROME DIAGNOSTIC, Decadron, Dexabliss, DexPak Jr TaperPak, DexPak TaperPak, Dxevo, Hemady, HiDex, TaperDex, ZCORT, Zema-Pak, ZoDex, ZonaCort 11 Day, ZonaCort 7 Day What should I tell my health care provider before I take this medicine? They need to know if you have any of these conditions:  Cushing's syndrome  diabetes  glaucoma  heart disease  high blood pressure  infection like herpes, measles,  tuberculosis, or chickenpox  kidney disease  liver disease  mental illness  myasthenia gravis  osteoporosis  previous heart attack  seizures  stomach or intestine problems  thyroid disease  an unusual or allergic reaction to dexamethasone, corticosteroids, other medicines, lactose, foods, dyes, or preservatives  pregnant or trying to get pregnant  breast-feeding How should I use this medicine? Take this medicine by mouth with a drink of water. Follow the directions on the prescription label. Take it with food or milk to avoid stomach upset. If you are taking this medicine once a day, take it in the morning. Do not take more medicine than you are told to take. Do not suddenly stop taking your medicine because you may develop a severe reaction. Your doctor will tell you how much medicine to take. If your doctor wants you to stop the medicine, the dose may be slowly lowered over time to avoid any side effects. Talk to your pediatrician regarding the use of this medicine in children. Special care may be needed. Patients over 77 years old may have a stronger reaction and need a smaller dose. Overdosage: If you think you have taken too much of this medicine contact a poison control center or emergency room at once. NOTE: This medicine is only for you. Do not share this medicine with others. What if I miss  a dose? If you miss a dose, take it as soon as you can. If it is almost time for your next dose, talk to your doctor or health care professional. You may need to miss a dose or take an extra dose. Do not take double or extra doses without advice. What may interact with this medicine? Do not take this medicine with any of the following medications:  live virus vaccines This medicine may also interact with the following medications:  aminoglutethimide  amphotericin B  aspirin and aspirin-like medicines  certain antibiotics like erythromycin, clarithromycin, and  troleandomycin  certain antivirals for HIV or hepatitis  certain medicines for seizures like carbamazepine, phenobarbital, phenytoin  certain medicines to treat myasthenia gravis  cholestyramine  cyclosporine  digoxin  diuretics  ephedrine  female hormones, like estrogen or progestins and birth control pills  insulin or other medicines for diabetes  isoniazid  ketoconazole  medicines that relax muscles for surgery  mifepristone  NSAIDs, medicines for pain and inflammation, like ibuprofen or naproxen  rifampin  skin tests for allergies  thalidomide  vaccines  warfarin This list may not describe all possible interactions. Give your health care provider a list of all the medicines, herbs, non-prescription drugs, or dietary supplements you use. Also tell them if you smoke, drink alcohol, or use illegal drugs. Some items may interact with your medicine. What should I watch for while using this medicine? Visit your health care professional for regular checks on your progress. Tell your health care professional if your symptoms do not start to get better or if they get worse. Your condition will be monitored carefully while you are receiving this medicine. Wear a medical ID bracelet or chain. Carry a card that describes your disease and details of your medicine and dosage times. This medicine may increase your risk of getting an infection. Call your health care professional for advice if you get a fever, chills, or sore throat, or other symptoms of a cold or flu. Do not treat yourself. Try to avoid being around people who are sick. Call your health care professional if you are around anyone with measles, chickenpox, or if you develop sores or blisters that do not heal properly. If you are going to need surgery or other procedures, tell your doctor or health care professional that you have taken this medicine within the last 12 months. Ask your doctor or health care  professional about your diet. You may need to lower the amount of salt you eat. This medicine may increase blood sugar. Ask your healthcare provider if changes in diet or medicines are needed if you have diabetes. What side effects may I notice from receiving this medicine? Side effects that you should report to your doctor or health care professional as soon as possible:  allergic reactions like skin rash, itching or hives, swelling of the face, lips, or tongue  bloody or black, tarry stools  changes in emotions or moods  changes in vision  confusion, excitement, restlessness  depressed mood  eye pain  hallucinations  fever or chills, cough, sore throat, pain or difficulty passing urine  muscle weakness  severe or sudden stomach or belly pain  signs and symptoms of high blood sugar such as being more thirsty or hungry or having to urinate more than normal. You may also feel very tired or have blurry vision.  signs and symptoms of infection like fever; chills; cough; sore throat; pain or trouble passing urine  swelling of ankles, feet  unusual bruising or bleeding  wounds that do not heal Side effects that usually do not require medical attention (report to your doctor or health care professional if they continue or are bothersome):  increased appetite  increased growth of face or body hair  headache  nausea, vomiting  skin problems, acne, thin and shiny skin  trouble sleeping  weight gain This list may not describe all possible side effects. Call your doctor for medical advice about side effects. You may report side effects to FDA at 1-800-FDA-1088. Where should I keep my medicine? Keep out of the reach of children. Store at room temperature between 20 and 25 degrees C (68 and 77 degrees F). Protect from light. Throw away any unused medicine after the expiration date. NOTE: This sheet is a summary. It may not cover all possible information. If you have  questions about this medicine, talk to your doctor, pharmacist, or health care provider.  2020 Elsevier/Gold Standard (2018-12-29 14:23:34)   Lenalidomide Oral Capsules What is this medicine? LENALIDOMIDE (len a LID oh mide) is a chemotherapy drug that targets specific proteins within cancer cells and stops the cancer cell from growing. It is used to treat multiple myeloma, certain types of lymphoma, and some myelodysplastic syndromes that cause severe anemia requiring blood transfusions. This medicine may be used for other purposes; ask your health care provider or pharmacist if you have questions. COMMON BRAND NAME(S): Revlimid What should I tell my health care provider before I take this medicine? They need to know if you have any of these conditions:  blood clots in the legs or the lungs  high blood pressure  high cholesterol  infection  irregular monthly periods or menstrual cycles  kidney disease  liver disease  smoke tobacco  thyroid disease  an unusual or allergic reaction to lenalidomide, thalidomide, other medicines, foods, dyes, or preservatives  pregnant or trying to get pregnant  breast-feeding How should I use this medicine? Take this medicine by mouth with a glass of water. Follow the directions on the prescription label. Do not cut, crush, or chew this medicine. Take your medicine at regular intervals. Do not take it more often than directed. Do not stop taking except on your doctor's advice. A MedGuide will be given with each prescription and refill. Read this guide carefully each time. The MedGuide may change frequently. Talk to your pediatrician regarding the use of this medicine in children. Special care may be needed. Overdosage: If you think you have taken too much of this medicine contact a poison control center or emergency room at once. NOTE: This medicine is only for you. Do not share this medicine with others. What if I miss a dose? If you miss a  dose, take it as soon as you can. If your next dose is to be taken in less than 12 hours, then do not take the missed dose. Take the next dose at your regular time. Do not take double or extra doses. What may interact with this medicine? This medicine may interact with the following medications:  digoxin  medicines that increase the risk of thrombosis like estrogens or erythropoietic agents (e.g., epoetin alfa and darbepoetin alfa)  warfarin This list may not describe all possible interactions. Give your health care provider a list of all the medicines, herbs, non-prescription drugs, or dietary supplements you use. Also tell them if you smoke, drink alcohol, or use illegal drugs. Some items may interact with your medicine. What should I watch for while  using this medicine? You may need blood work done while you are taking this medicine. This medicine may cause serious skin reactions. They can happen weeks to months after starting the medicine. Contact your health care provider right away if you notice fevers or flu-like symptoms with a rash. The rash may be red or purple and then turn into blisters or peeling of the skin. Or, you might notice a red rash with swelling of the face, lips or lymph nodes in your neck or under your arms. This medicine is available only through a special program. Doctors, pharmacies, and patients must meet all of the conditions of the program. Your health care provider will help you get signed up with the program if you need this medicine. Through the program you will only receive up to a 28 day supply of the medicine at one time. You will need a new prescription for each refill. This medicine can cause birth defects. Do not get pregnant while taking this drug. Females with child-bearing potential will need to have 2 negative pregnancy tests before starting this medicine. Pregnancy testing must be done every 2 to 4 weeks as directed while taking this medicine. Use 2 reliable  forms of birth control together while you are taking this medicine and for 4 weeks after you stop taking this medicine. If you think that you might be pregnant talk to your doctor right away. Do not breast-feed an infant while taking this medicine. Men must use a latex condom during sexual contact with a woman while taking this medicine and for 4 weeks after you stop taking this medicine. A latex condom is needed even if you have had a vasectomy. Contact your doctor right away if your partner becomes pregnant. Do not donate sperm while taking this medicine and for 4 weeks after you stop taking this medicine. Do not give blood while taking the medicine and for 4 weeks after completion of treatment to avoid exposing pregnant women to the medicine through the donated blood. Talk to your doctor about your risk of cancer. You may be more at risk for certain types of cancers if you take this medicine. What side effects may I notice from receiving this medicine? Side effects that you should report to your doctor or health care professional as soon as possible:  allergic reactions like skin rash, itching or hives, swelling of the face, lips, or tongue  breathing problems  chest pain or tightness  fast, irregular heartbeat  feeling faint  low blood counts - this medicine may decrease the number of white blood cells, red blood cells and platelets. You may be at increased risk for infections and bleeding.  rash, fever, and swollen lymph nodes  redness, blistering, peeling or loosening of the skin, including inside the mouth  seizures  signs and symptoms of bleeding such as bloody or black, tarry stools; red or dark-brown urine; spitting up blood or brown material that looks like coffee grounds; red spots on the skin; unusual bruising or bleeding from the eye, gums, or nose  signs and symptoms of a blood clot such as breathing problems; changes in vision; chest pain; severe, sudden headache; pain,  swelling, warmth in the leg; trouble speaking; sudden numbness or weakness of the face, arm or leg  signs and symptoms of liver injury like dark yellow or brown urine; general ill feeling or flu-like symptoms; light-colored stools; loss of appetite; nausea; right upper belly pain; unusually weak or tired; yellowing of the eyes or  skin  signs and symptoms of a stroke like changes in vision; confusion; trouble speaking or understanding; severe headaches; sudden numbness or weakness of the face, arm or leg; trouble walking; dizziness; loss of balance or coordination  sweating  vomiting Side effects that usually do not require medical attention (report to your doctor or health care professional if they continue or are bothersome):  constipation  cough  diarrhea  joint pain  muscle cramps  swelling of the arms, legs, or skin  tiredness  trouble sleeping This list may not describe all possible side effects. Call your doctor for medical advice about side effects. You may report side effects to FDA at 1-800-FDA-1088. Where should I keep my medicine? Keep out of the reach of children. Store at room temperature between 15 and 30 degrees C (59 and 86 degrees F). Throw away any unused medicine after the expiration date. NOTE: This sheet is a summary. It may not cover all possible information. If you have questions about this medicine, talk to your doctor, pharmacist, or health care provider.  2020 Elsevier/Gold Standard (2018-09-18 15:09:17)   Bortezomib injection What is this medicine? BORTEZOMIB (bor TEZ oh mib) is a medicine that targets proteins in cancer cells and stops the cancer cells from growing. It is used to treat multiple myeloma and mantle-cell lymphoma. This medicine may be used for other purposes; ask your health care provider or pharmacist if you have questions. COMMON BRAND NAME(S): Velcade What should I tell my health care provider before I take this medicine? They need  to know if you have any of these conditions:  diabetes  heart disease  irregular heartbeat  liver disease  on hemodialysis  low blood counts, like low white blood cells, platelets, or hemoglobin  peripheral neuropathy  taking medicine for blood pressure  an unusual or allergic reaction to bortezomib, mannitol, boron, other medicines, foods, dyes, or preservatives  pregnant or trying to get pregnant  breast-feeding How should I use this medicine? This medicine is for injection into a vein or for injection under the skin. It is given by a health care professional in a hospital or clinic setting. Talk to your pediatrician regarding the use of this medicine in children. Special care may be needed. Overdosage: If you think you have taken too much of this medicine contact a poison control center or emergency room at once. NOTE: This medicine is only for you. Do not share this medicine with others. What if I miss a dose? It is important not to miss your dose. Call your doctor or health care professional if you are unable to keep an appointment. What may interact with this medicine? This medicine may interact with the following medications:  ketoconazole  rifampin  ritonavir  St. John's Wort This list may not describe all possible interactions. Give your health care provider a list of all the medicines, herbs, non-prescription drugs, or dietary supplements you use. Also tell them if you smoke, drink alcohol, or use illegal drugs. Some items may interact with your medicine. What should I watch for while using this medicine? You may get drowsy or dizzy. Do not drive, use machinery, or do anything that needs mental alertness until you know how this medicine affects you. Do not stand or sit up quickly, especially if you are an older patient. This reduces the risk of dizzy or fainting spells. In some cases, you may be given additional medicines to help with side effects. Follow all  directions for their use.  Call your doctor or health care professional for advice if you get a fever, chills or sore throat, or other symptoms of a cold or flu. Do not treat yourself. This drug decreases your body's ability to fight infections. Try to avoid being around people who are sick. This medicine may increase your risk to bruise or bleed. Call your doctor or health care professional if you notice any unusual bleeding. You may need blood work done while you are taking this medicine. In some patients, this medicine may cause a serious brain infection that may cause death. If you have any problems seeing, thinking, speaking, walking, or standing, tell your doctor right away. If you cannot reach your doctor, urgently seek other source of medical care. Check with your doctor or health care professional if you get an attack of severe diarrhea, nausea and vomiting, or if you sweat a lot. The loss of too much body fluid can make it dangerous for you to take this medicine. Do not become pregnant while taking this medicine or for at least 7 months after stopping it. Women should inform their doctor if they wish to become pregnant or think they might be pregnant. Men should not father a child while taking this medicine and for at least 4 months after stopping it. There is a potential for serious side effects to an unborn child. Talk to your health care professional or pharmacist for more information. Do not breast-feed an infant while taking this medicine or for 2 months after stopping it. This medicine may interfere with the ability to have a child. You should talk with your doctor or health care professional if you are concerned about your fertility. What side effects may I notice from receiving this medicine? Side effects that you should report to your doctor or health care professional as soon as possible:  allergic reactions like skin rash, itching or hives, swelling of the face, lips, or  tongue  breathing problems  changes in hearing  changes in vision  fast, irregular heartbeat  feeling faint or lightheaded, falls  pain, tingling, numbness in the hands or feet  right upper belly pain  seizures  swelling of the ankles, feet, hands  unusual bleeding or bruising  unusually weak or tired  vomiting  yellowing of the eyes or skin Side effects that usually do not require medical attention (report to your doctor or health care professional if they continue or are bothersome):  changes in emotions or moods  constipation  diarrhea  loss of appetite  headache  irritation at site where injected  nausea This list may not describe all possible side effects. Call your doctor for medical advice about side effects. You may report side effects to FDA at 1-800-FDA-1088. Where should I keep my medicine? This drug is given in a hospital or clinic and will not be stored at home. NOTE: This sheet is a summary. It may not cover all possible information. If you have questions about this medicine, talk to your doctor, pharmacist, or health care provider.  2020 Elsevier/Gold Standard (2017-10-27 16:29:31)   Carfilzomib injection What is this medicine? CARFILZOMIB (kar FILZ oh mib) targets a specific protein within cancer cells and stops the cancer cells from growing. It is used to treat multiple myeloma. This medicine may be used for other purposes; ask your health care provider or pharmacist if you have questions. COMMON BRAND NAME(S): KYPROLIS What should I tell my health care provider before I take this medicine? They need to  know if you have any of these conditions:  heart disease  history of blood clots  irregular heartbeat  kidney disease  liver disease  lung or breathing disease  an unusual or allergic reaction to carfilzomib, or other medicines, foods, dyes, or preservatives  pregnant or trying to get pregnant  breast-feeding How should I use  this medicine? This medicine is for injection or infusion into a vein. It is given by a health care professional in a hospital or clinic setting. Talk to your pediatrician regarding the use of this medicine in children. Special care may be needed. Overdosage: If you think you have taken too much of this medicine contact a poison control center or emergency room at once. NOTE: This medicine is only for you. Do not share this medicine with others. What if I miss a dose? It is important not to miss your dose. Call your doctor or health care professional if you are unable to keep an appointment. What may interact with this medicine? Interactions are not expected. Give your health care provider a list of all the medicines, herbs, non-prescription drugs, or dietary supplements you use. Also tell them if you smoke, drink alcohol, or use illegal drugs. Some items may interact with your medicine. This list may not describe all possible interactions. Give your health care provider a list of all the medicines, herbs, non-prescription drugs, or dietary supplements you use. Also tell them if you smoke, drink alcohol, or use illegal drugs. Some items may interact with your medicine. What should I watch for while using this medicine? Your condition will be monitored carefully while you are receiving this medicine. Report any side effects. Continue your course of treatment even though you feel ill unless your doctor tells you to stop. You may need blood work done while you are taking this medicine. Do not become pregnant while taking this medicine or for at least 6 months after stopping it. Women should inform their doctor if they wish to become pregnant or think they might be pregnant. There is a potential for serious side effects to an unborn child. Men should not father a child while taking this medicine and for at least 3 months after stopping it. Talk to your health care professional or pharmacist for more  information. Do not breast-feed an infant while taking this medicine or for 2 weeks after the last dose. Check with your doctor or health care professional if you get an attack of severe diarrhea, nausea and vomiting, or if you sweat a lot. The loss of too much body fluid can make it dangerous for you to take this medicine. You may get dizzy. Do not drive, use machinery, or do anything that needs mental alertness until you know how this medicine affects you. Do not stand or sit up quickly, especially if you are an older patient. This reduces the risk of dizzy or fainting spells. What side effects may I notice from receiving this medicine? Side effects that you should report to your doctor or health care professional as soon as possible:  allergic reactions like skin rash, itching or hives, swelling of the face, lips, or tongue  confusion  dizziness  feeling faint or lightheaded  fever or chills  palpitations  seizures  signs and symptoms of bleeding such as bloody or black, tarry stools; red or dark-brown urine; spitting up blood or brown material that looks like coffee grounds; red spots on the skin; unusual bruising or bleeding including from the eye, gums,  or nose  signs and symptoms of a blood clot such as breathing problems; changes in vision; chest pain; severe, sudden headache; pain, swelling, warmth in the leg; trouble speaking; sudden numbness or weakness of the face, arm or leg  signs and symptoms of kidney injury like trouble passing urine or change in the amount of urine  signs and symptoms of liver injury like dark yellow or brown urine; general ill feeling or flu-like symptoms; light-colored stools; loss of appetite; nausea; right upper belly pain; unusually weak or tired; yellowing of the eyes or skin Side effects that usually do not require medical attention (report to your doctor or health care professional if they continue or are bothersome):  back  pain  cough  diarrhea  headache  muscle cramps  trouble sleeping  vomiting This list may not describe all possible side effects. Call your doctor for medical advice about side effects. You may report side effects to FDA at 1-800-FDA-1088. Where should I keep my medicine? This drug is given in a hospital or clinic and will not be stored at home. NOTE: This sheet is a summary. It may not cover all possible information. If you have questions about this medicine, talk to your doctor, pharmacist, or health care provider.  2020 Elsevier/Gold Standard (2019-02-22 19:44:21)   Pomalidomide oral capsules What is this medicine? POMALIDOMIDE (pom a LID oh mide) is a chemotherapy drug used to treat multiple myeloma and Kaposi sarcoma. It targets specific proteins within cancer cells and stops the cancer cell from growing. This medicine may be used for other purposes; ask your health care provider or pharmacist if you have questions. COMMON BRAND NAME(S): POMALYST What should I tell my health care provider before I take this medicine? They need to know if you have any of these conditions:  high blood pressure  high cholesterol  history of blood clots  irregular monthly periods or menstrual cycles  kidney disease  liver disease  smoke tobacco  an unusual or allergic reaction to pomalidomide, other medicines, foods, dyes, or preservatives  pregnant or trying to get pregnant  breast-feeding How should I use this medicine? Take this medicine by mouth with a glass of water. Follow the directions on the prescription label. You can take it with or without food. If it upsets your stomach, take it with food. Do not cut, crush, or chew this medicine. Take your medicine at regular intervals. Do not take it more often than directed. Do not stop taking except on your doctor's advice. A special MedGuide will be given to you by the pharmacist with each prescription and refill. Be sure to read  this information carefully each time. Talk to your pediatrician regarding the use of this medicine in children. Special care may be needed. Overdosage: If you think you have taken too much of this medicine contact a poison control center or emergency room at once. NOTE: This medicine is only for you. Do not share this medicine with others. What if I miss a dose? If you miss a dose, take it as soon as you can. If your next dose is to be taken in less than 12 hours, then do not take the missed dose. Take the next dose at your regular time. Do not take double or extra doses. What may interact with this medicine? This medicine may interact with the following medications:  ciprofloxacin  fluvoxamine  tobacco (cigarettes) This list may not describe all possible interactions. Give your health care provider a list  of all the medicines, herbs, non-prescription drugs, or dietary supplements you use. Also tell them if you smoke, drink alcohol, or use illegal drugs. Some items may interact with your medicine. What should I watch for while using this medicine? This drug may make you feel generally unwell. This is not uncommon, as chemotherapy can affect healthy cells as well as cancer cells. Report any side effects. Continue your course of treatment even though you feel ill unless your doctor tells you to stop. You may need blood work done while you are taking this medicine. This medicine may cause serious skin reactions. They can happen weeks to months after starting the medicine. Contact your health care provider right away if you notice fevers or flu-like symptoms with a rash. The rash may be red or purple and then turn into blisters or peeling of the skin. Or, you might notice a red rash with swelling of the face, lips or lymph nodes in your neck or under your arms. This medicine is available only through a special program. Doctors, pharmacies, and patients must meet all of the conditions of the program.  Your health care provider will help you get signed up with the program if you need this medicine. Through the program you will only receive up to a 28 day supply of the medicine at one time. You will need a new prescription for each refill. This medicine can cause birth defects. Do not get pregnant for at least 4 weeks before, during, and for at least 4 weeks after taking this drug. Females with child-bearing potential will need to have 2 negative pregnancy tests before starting this medicine. Pregnancy testing must be done every 2 to 4 weeks as directed while taking this medicine. Use 2 reliable forms of birth control together while you are taking this medicine and for 4 weeks after you stop taking this medicine. If you think that you might be pregnant talk to your doctor right away. Men must use a latex condom during sexual contact with a woman while taking this medicine and for 4 weeks after you stop taking this medicine. A latex condom is needed even if you have had a vasectomy. Contact your doctor right away if your partner becomes pregnant. Do not donate sperm while taking this medicine and for 4 weeks after you stop taking this medicine. Do not give blood while taking the medicine and for 1 month after completion of treatment to avoid exposing pregnant women to the medicine through the donated blood. Talk to your doctor about your risk of cancer. You may be more at risk for certain types of cancers if you take this medicine. If you smoke, tell your doctor if you notice this medicine is not working well for you. Talk to your doctor if you are a smoker or if you decide to stop smoking. What side effects may I notice from receiving this medicine? Side effects that you should report to your doctor or health care professional as soon as possible:  allergic reactions like skin rash, itching or hives, swelling of the face, lips, or tongue  fast heartbeat  feeling faint  low blood counts - this  medicine may decrease the number of white blood cells, red blood cells and platelets. You may be at increased risk for infections and bleeding  rash, fever, and swollen lymph nodes  redness, blistering, peeling or loosening of the skin, including inside the mouth  signs and symptoms of a blood clot such as breathing problems;  changes in vision; chest pain; severe, sudden headache; pain, swelling, warmth in the leg; trouble speaking; sudden numbness or weakness of the face, arm or leg  signs and symptoms of liver injury like dark yellow or brown urine; general ill feeling or flu-like symptoms; light-colored stools; loss of appetite; nausea; right upper belly pain; unusually weak or tired; yellowing of the eyes or skin  signs and symptoms of a stroke like changes in vision; confusion; trouble speaking or understanding; severe headaches; sudden numbness or weakness of the face, arm or leg; trouble walking; dizziness; loss of balance or coordination  sweating  tingling, numbness in the hands or feet  trouble swallowing  unusual bleeding or bruising Side effects that usually do not require medical attention (report to your doctor or health care professional if they continue or are bothersome):  back pain  constipation  diarrhea  nausea  tiredness This list may not describe all possible side effects. Call your doctor for medical advice about side effects. You may report side effects to FDA at 1-800-FDA-1088. Where should I keep my medicine? Keep out of the reach of children. Store between 20 and 25 degrees C (68 and 77 degrees F). Throw away any unused medicine after the expiration date. NOTE: This sheet is a summary. It may not cover all possible information. If you have questions about this medicine, talk to your doctor, pharmacist, or health care provider.  2020 Elsevier/Gold Standard (2018-11-13 16:55:02)

## 2019-08-26 NOTE — Progress Notes (Signed)
Patient on plan of care prior to pathways. 

## 2019-08-26 NOTE — Patient Instructions (Signed)
Magnesium Sulfate injection What is this medicine? MAGNESIUM SULFATE (mag NEE zee um SUL fate) is an electrolyte injection commonly used to treat low magnesium levels in your blood. It is also used to prevent or control seizures in women with preeclampsia or eclampsia. This medicine may be used for other purposes; ask your health care provider or pharmacist if you have questions. What should I tell my health care provider before I take this medicine? They need to know if you have any of these conditions:  heart disease  history of irregular heart beat  kidney disease  an unusual or allergic reaction to magnesium sulfate, medicines, foods, dyes, or preservatives  pregnant or trying to get pregnant  breast-feeding How should I use this medicine? This medicine is for infusion into a vein. It is given by a health care professional in a hospital or clinic setting. Talk to your pediatrician regarding the use of this medicine in children. While this drug may be prescribed for selected conditions, precautions do apply. Overdosage: If you think you have taken too much of this medicine contact a poison control center or emergency room at once. NOTE: This medicine is only for you. Do not share this medicine with others. What if I miss a dose? This does not apply. What may interact with this medicine? This medicine may interact with the following medications:  certain medicines for anxiety or sleep  certain medicines for seizures like phenobarbital  digoxin  medicines that relax muscles for surgery  narcotic medicines for pain This list may not describe all possible interactions. Give your health care provider a list of all the medicines, herbs, non-prescription drugs, or dietary supplements you use. Also tell them if you smoke, drink alcohol, or use illegal drugs. Some items may interact with your medicine. What should I watch for while using this medicine? Your condition will be  monitored carefully while you are receiving this medicine. You may need blood work done while you are receiving this medicine. What side effects may I notice from receiving this medicine? Side effects that you should report to your doctor or health care professional as soon as possible:  allergic reactions like skin rash, itching or hives, swelling of the face, lips, or tongue  facial flushing  muscle weakness  signs and symptoms of low blood pressure like dizziness; feeling faint or lightheaded, falls; unusually weak or tired  signs and symptoms of a dangerous change in heartbeat or heart rhythm like chest pain; dizziness; fast or irregular heartbeat; palpitations; breathing problems  sweating This list may not describe all possible side effects. Call your doctor for medical advice about side effects. You may report side effects to FDA at 1-800-FDA-1088. Where should I keep my medicine? This drug is given in a hospital or clinic and will not be stored at home. NOTE: This sheet is a summary. It may not cover all possible information. If you have questions about this medicine, talk to your doctor, pharmacist, or health care provider.  2020 Elsevier/Gold Standard (2016-01-03 12:31:42)  

## 2019-09-02 ENCOUNTER — Inpatient Hospital Stay: Payer: Medicare PPO | Attending: Hematology and Oncology

## 2019-09-02 ENCOUNTER — Inpatient Hospital Stay: Payer: Medicare PPO

## 2019-09-02 ENCOUNTER — Telehealth: Payer: Self-pay | Admitting: Hematology and Oncology

## 2019-09-02 ENCOUNTER — Other Ambulatory Visit: Payer: Self-pay

## 2019-09-02 DIAGNOSIS — M542 Cervicalgia: Secondary | ICD-10-CM | POA: Diagnosis not present

## 2019-09-02 DIAGNOSIS — R05 Cough: Secondary | ICD-10-CM | POA: Diagnosis not present

## 2019-09-02 DIAGNOSIS — I129 Hypertensive chronic kidney disease with stage 1 through stage 4 chronic kidney disease, or unspecified chronic kidney disease: Secondary | ICD-10-CM | POA: Diagnosis not present

## 2019-09-02 DIAGNOSIS — M898X9 Other specified disorders of bone, unspecified site: Secondary | ICD-10-CM | POA: Diagnosis not present

## 2019-09-02 DIAGNOSIS — R61 Generalized hyperhidrosis: Secondary | ICD-10-CM | POA: Insufficient documentation

## 2019-09-02 DIAGNOSIS — Z9484 Stem cells transplant status: Secondary | ICD-10-CM | POA: Insufficient documentation

## 2019-09-02 DIAGNOSIS — Z79899 Other long term (current) drug therapy: Secondary | ICD-10-CM | POA: Insufficient documentation

## 2019-09-02 DIAGNOSIS — N183 Chronic kidney disease, stage 3 unspecified: Secondary | ICD-10-CM | POA: Diagnosis not present

## 2019-09-02 DIAGNOSIS — Z9221 Personal history of antineoplastic chemotherapy: Secondary | ICD-10-CM | POA: Insufficient documentation

## 2019-09-02 DIAGNOSIS — C9001 Multiple myeloma in remission: Secondary | ICD-10-CM

## 2019-09-02 DIAGNOSIS — Z833 Family history of diabetes mellitus: Secondary | ICD-10-CM | POA: Insufficient documentation

## 2019-09-02 DIAGNOSIS — Z87891 Personal history of nicotine dependence: Secondary | ICD-10-CM | POA: Diagnosis not present

## 2019-09-02 DIAGNOSIS — Z808 Family history of malignant neoplasm of other organs or systems: Secondary | ICD-10-CM | POA: Insufficient documentation

## 2019-09-02 DIAGNOSIS — M791 Myalgia, unspecified site: Secondary | ICD-10-CM | POA: Diagnosis not present

## 2019-09-02 DIAGNOSIS — R809 Proteinuria, unspecified: Secondary | ICD-10-CM | POA: Diagnosis not present

## 2019-09-02 DIAGNOSIS — R110 Nausea: Secondary | ICD-10-CM | POA: Diagnosis not present

## 2019-09-02 DIAGNOSIS — C9000 Multiple myeloma not having achieved remission: Secondary | ICD-10-CM | POA: Diagnosis not present

## 2019-09-02 DIAGNOSIS — E1122 Type 2 diabetes mellitus with diabetic chronic kidney disease: Secondary | ICD-10-CM | POA: Insufficient documentation

## 2019-09-02 DIAGNOSIS — Z801 Family history of malignant neoplasm of trachea, bronchus and lung: Secondary | ICD-10-CM | POA: Insufficient documentation

## 2019-09-02 DIAGNOSIS — G8929 Other chronic pain: Secondary | ICD-10-CM | POA: Insufficient documentation

## 2019-09-02 DIAGNOSIS — E538 Deficiency of other specified B group vitamins: Secondary | ICD-10-CM | POA: Insufficient documentation

## 2019-09-02 DIAGNOSIS — D649 Anemia, unspecified: Secondary | ICD-10-CM | POA: Diagnosis not present

## 2019-09-02 DIAGNOSIS — M879 Osteonecrosis, unspecified: Secondary | ICD-10-CM | POA: Diagnosis not present

## 2019-09-02 DIAGNOSIS — M549 Dorsalgia, unspecified: Secondary | ICD-10-CM | POA: Insufficient documentation

## 2019-09-02 DIAGNOSIS — Z885 Allergy status to narcotic agent status: Secondary | ICD-10-CM | POA: Insufficient documentation

## 2019-09-02 DIAGNOSIS — G2581 Restless legs syndrome: Secondary | ICD-10-CM | POA: Diagnosis not present

## 2019-09-02 DIAGNOSIS — R5383 Other fatigue: Secondary | ICD-10-CM | POA: Diagnosis not present

## 2019-09-02 DIAGNOSIS — G629 Polyneuropathy, unspecified: Secondary | ICD-10-CM | POA: Insufficient documentation

## 2019-09-02 DIAGNOSIS — Z8249 Family history of ischemic heart disease and other diseases of the circulatory system: Secondary | ICD-10-CM | POA: Insufficient documentation

## 2019-09-02 DIAGNOSIS — R0981 Nasal congestion: Secondary | ICD-10-CM | POA: Insufficient documentation

## 2019-09-02 DIAGNOSIS — Z886 Allergy status to analgesic agent status: Secondary | ICD-10-CM | POA: Insufficient documentation

## 2019-09-02 DIAGNOSIS — Z832 Family history of diseases of the blood and blood-forming organs and certain disorders involving the immune mechanism: Secondary | ICD-10-CM | POA: Insufficient documentation

## 2019-09-02 DIAGNOSIS — Z800 Family history of malignant neoplasm of digestive organs: Secondary | ICD-10-CM | POA: Insufficient documentation

## 2019-09-02 DIAGNOSIS — Z803 Family history of malignant neoplasm of breast: Secondary | ICD-10-CM | POA: Insufficient documentation

## 2019-09-02 DIAGNOSIS — Z822 Family history of deafness and hearing loss: Secondary | ICD-10-CM | POA: Insufficient documentation

## 2019-09-02 DIAGNOSIS — Z9481 Bone marrow transplant status: Secondary | ICD-10-CM | POA: Insufficient documentation

## 2019-09-02 LAB — MAGNESIUM: Magnesium: 1.2 mg/dL — ABNORMAL LOW (ref 1.7–2.4)

## 2019-09-02 MED ORDER — MAGNESIUM SULFATE 4 GM/100ML IV SOLN
4.0000 g | Freq: Once | INTRAVENOUS | Status: AC
  Administered 2019-09-02: 4 g via INTRAVENOUS

## 2019-09-02 MED ORDER — SODIUM CHLORIDE 0.9% FLUSH
10.0000 mL | Freq: Once | INTRAVENOUS | Status: AC | PRN
  Administered 2019-09-02: 10 mL
  Filled 2019-09-02: qty 10

## 2019-09-02 MED ORDER — SODIUM CHLORIDE 0.9 % IV SOLN
Freq: Once | INTRAVENOUS | Status: AC
  Filled 2019-09-02: qty 250

## 2019-09-02 MED ORDER — CYANOCOBALAMIN 1000 MCG/ML IJ SOLN
1000.0000 ug | Freq: Once | INTRAMUSCULAR | Status: AC
  Administered 2019-09-02: 1000 ug via INTRAMUSCULAR

## 2019-09-02 MED ORDER — HEPARIN SOD (PORK) LOCK FLUSH 100 UNIT/ML IV SOLN
500.0000 [IU] | Freq: Once | INTRAVENOUS | Status: AC | PRN
  Administered 2019-09-02: 500 [IU]
  Filled 2019-09-02: qty 5

## 2019-09-02 NOTE — Telephone Encounter (Signed)
Re:  Myeloma  I spoke with the patient regarding my conversation with Dr Virgina Norfolk, [facility] bone marrow transplant attending.  Stem cells From [place] are available.  They are working through the paperwork.  She will meet with the myeloma attending, Dr Fatima Sanger, on 09/10/2019.  Clinical trials vs off trial regimens will be discussed.   Lequita Asal, MD

## 2019-09-02 NOTE — Patient Instructions (Signed)

## 2019-09-08 ENCOUNTER — Ambulatory Visit (INDEPENDENT_AMBULATORY_CARE_PROVIDER_SITE_OTHER): Payer: Medicare PPO | Admitting: Internal Medicine

## 2019-09-08 ENCOUNTER — Encounter: Payer: Self-pay | Admitting: Internal Medicine

## 2019-09-08 VITALS — Temp 99.2°F

## 2019-09-08 DIAGNOSIS — R05 Cough: Secondary | ICD-10-CM

## 2019-09-08 DIAGNOSIS — C9000 Multiple myeloma not having achieved remission: Secondary | ICD-10-CM

## 2019-09-08 DIAGNOSIS — R059 Cough, unspecified: Secondary | ICD-10-CM

## 2019-09-08 DIAGNOSIS — J019 Acute sinusitis, unspecified: Secondary | ICD-10-CM

## 2019-09-08 MED ORDER — PROMETHAZINE-DM 6.25-15 MG/5ML PO SYRP: 5.0000 mL | ORAL_SOLUTION | Freq: Four times a day (QID) | ORAL | 0 refills | Status: DC | PRN

## 2019-09-08 NOTE — Progress Notes (Signed)
Date:  09/08/2019   Name:  Sarah [name]   DOB:  April 20, 1956   MRN:  [id number]  I connected with this patient, Sarah [name], by telephone at the patient's home.  I verified that I am speaking with the correct person using two identifiers. This visit was conducted via telephone due to the Covid-19 outbreak from my office at [facility] in [place], [place]. I discussed the limitations, risks, security and privacy concerns of performing an evaluation and management service by telephone. I also discussed with the patient that there may be a patient responsible charge related to this service. The patient expressed understanding and agreed to proceed.  Chief Complaint: No chief complaint on file.  Cough This is a new problem. The current episode started in the past 7 days. The problem has been gradually worsening. The cough is productive of purulent sputum. Associated symptoms include a fever, nasal congestion, postnasal drip, rhinorrhea and a sore throat. Pertinent negatives include no chest pain, chills, headaches, shortness of breath, sweats or wheezing. She has tried OTC cough suppressant (allegra and flonase) for the symptoms.  Sinus Problem Associated symptoms include coughing and a sore throat. Pertinent negatives include no chills, diaphoresis, headaches or shortness of breath.    Lab Results  Component Value Date   CREATININE 1.03 (H) 08/20/2019   BUN 23 08/20/2019   NA 133 (L) 08/20/2019   K 4.0 08/20/2019   CL 103 08/20/2019   CO2 24 08/20/2019   Lab Results  Component Value Date   CHOL 131 11/25/2018   HDL 63 11/25/2018   LDLCALC 52 11/25/2018   TRIG 79 11/25/2018   CHOLHDL 2.1 11/25/2018   Lab Results  Component Value Date   TSH 2.105 08/20/2018   Lab Results  Component Value Date   HGBA1C 7.4 (H) 11/25/2018     Review of Systems  Constitutional: Positive for fever. Negative for chills, diaphoresis and fatigue.  HENT: Positive for postnasal drip,  rhinorrhea and sore throat.   Respiratory: Positive for cough. Negative for chest tightness, shortness of breath and wheezing.   Cardiovascular: Negative for chest pain.  Neurological: Negative for dizziness, light-headedness and headaches.    Patient Active Problem List   Diagnosis Date Noted  . Hypocalcemia 08/28/2019  . Stage 3 chronic kidney disease 05/12/2019  . Hypertension 04/20/2019  . Chronic nausea 12/17/2018  . Chemotherapy-induced neuropathy ([place]) 12/17/2018  . Hyperlipidemia associated with type 2 diabetes mellitus ([place]) 11/25/2018  . Normocytic anemia 10/21/2018  . Hypomagnesemia 08/20/2018  . Goals of care, counseling/discussion 08/20/2018  . B12 deficiency 08/20/2018  . Thrombocytopenia ([place]) 02/09/2018  . Benign essential HTN 08/21/2017  . Cardiomyopathy, idiopathic ([place]) 08/21/2017  . Carotid artery stenosis, asymptomatic, bilateral 08/06/2017  . Thyroid nodule 08/06/2017  . Cardiac syncope 07/25/2017  . Iron deficiency anemia due to chronic blood loss 05/13/2017  . Spinal stenosis of lumbar region with neurogenic claudication 04/09/2017  . Magnesium deficiency 04/08/2017  . 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 LBBB (left bundle branch block) 10/10/2016  . Long term prescription opiate use 09/07/2016  . Chronic pain syndrome 07/08/2016  . Pain medication agreement signed 07/08/2016  . Spondylosis of lumbar region without myelopathy or radiculopathy 07/08/2016  . Chronic diastolic CHF (congestive heart failure), NYHA class 2 ([place]) 06/05/2016  . LVH (left ventricular hypertrophy) due to hypertensive disease, with heart failure ([place]) 06/05/2016  . Mild aortic valve stenosis 06/05/2016  . 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[place]) 06/05/2016  . Environmental  and seasonal allergies 04/19/2016  . Insomnia 04/19/2016  . Bicuspid aortic valve 04/19/2016  . Asthma 04/19/2016  . Aortic regurgitation 04/19/2016  . Type II diabetes mellitus with  complication ([place]) 49/44/9675  . Depression, major, single episode, in partial remission ([place]) 04/12/2016  . Irritable bowel syndrome (IBS) 04/12/2016  . Hepatic steatosis 04/12/2016  . Multiple myeloma in remission ([place]) 04/02/2016  . Precordial pain 02/14/2016  . Shortness of breath 02/02/2016  . 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[place]) 07/06/2015  . Stem cell transplant candidate 05/16/2015  . Acute renal failure superimposed on stage 3 chronic kidney disease ([place]) 12/29/2014  . Colitis 02/14/2013  . Disequilibrium 01/14/2013    Allergies  Allergen Reactions  . Oxycodone-Acetaminophen Anaphylaxis    Swelling and rash  . Celebrex [Celecoxib] Diarrhea  . Codeine   . Benadryl [Diphenhydramine] Palpitations  . Morphine Itching and Rash  . Ondansetron Diarrhea  . Tylenol [Acetaminophen] Itching and Rash    Past Surgical History:  Procedure Laterality Date  . ABDOMINAL HYSTERECTOMY    . 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 CARDIAC ELECTROPHYSIOLOGY MAPPING AND ABLATION    . CARPAL TUNNEL RELEASE Bilateral   . CHOLECYSTECTOMY  2008  . COLONOSCOPY WITH PROPOFOL N/A 05/07/2017   Procedure: COLONOSCOPY WITH PROPOFOL;  Surgeon: [name], MD;  Location: [facility] ENDOSCOPY;  Service: Gastroenterology;  Laterality: N/A;  . ESOPHAGOGASTRODUODENOSCOPY (EGD) WITH PROPOFOL N/A 05/07/2017   Procedure: ESOPHAGOGASTRODUODENOSCOPY (EGD) WITH PROPOFOL;  Surgeon: [name], MD;  Location: [facility] ENDOSCOPY;  Service: Gastroenterology;  Laterality: N/A;  . FOOT SURGERY Bilateral   . INCONTINENCE SURGERY  2009  . INTERSTIM IMPLANT PLACEMENT    . other     over active bladder  . OTHER SURGICAL HISTORY     bladder stimulator   . PARTIAL HYSTERECTOMY  03/1996   fibroids  . PORTA CATH INSERTION N/A 03/10/2019   Procedure: PORTA CATH INSERTION;  Surgeon: Algernon Huxley, MD;  Location: [facility];  Service: Cardiovascular;  Laterality: N/A;  . TONSILLECTOMY  2007    Social History    Tobacco Use  . Smoking status: Former Smoker    Packs/day: 1.00    Years: 20.00    Pack years: 20.00    Types: Cigarettes    Quit date: 07/02/1991    Years since quitting: 28.2  . Smokeless tobacco: Never Used  Substance Use Topics  . Alcohol use: Not Currently  . Drug use: No     Medication list has been reviewed and updated.  Current Meds  Medication Sig  . allopurinol (ZYLOPRIM) 100 MG tablet TAKE 1 TABLET(100 MG) BY MOUTH DAILY as needed  . aspirin 81 MG chewable tablet Chew 1 tablet (81 mg total) by mouth daily.  . bisoprolol (ZEBETA) 10 MG tablet TAKE 1 TABLET(10 MG) BY MOUTH DAILY  . diclofenac sodium (VOLTAREN) 1 % GEL Apply 2 g topically 4 (four) times daily.  . diphenoxylate-atropine (LOMOTIL) 2.5-0.025 MG tablet Take 2 tablets by mouth daily as needed for diarrhea or loose stools.  . fentaNYL (DURAGESIC - DOSED MCG/HR) 25 MCG/HR patch Place 25 mcg onto the skin every 3 (three) days.   . fexofenadine (ALLEGRA) 180 MG tablet TAKE 1 TABLET(180 MG) BY MOUTH DAILY  . FLUoxetine (PROZAC) 40 MG capsule TAKE 1 CAPSULE EVERY DAY  . fluticasone (FLONASE) 50 MCG/ACT nasal spray Place 2 sprays into both nostrils daily.  . furosemide (LASIX) 20 MG tablet Take 20 mg by mouth as needed.   Marland Kitchen  hydrOXYzine (ATARAX/VISTARIL) 10 MG tablet TAKE 1 TABLET(10 MG) BY MOUTH THREE TIMES DAILY AS NEEDED  . lidocaine-prilocaine (EMLA) cream APPLY TOPICALLY AS NEEDED.  Marland Kitchen lisinopril (PRINIVIL,ZESTRIL) 10 MG tablet Take 10 mg by mouth daily.  . Magnesium Bisglycinate (MAG GLYCINATE) 100 MG TABS Take 100 mg by mouth 2 (two) times daily.  . metFORMIN (GLUCOPHAGE-XR) 500 MG 24 hr tablet Take 1 tablet (500 mg total) by mouth daily with breakfast.  . Multiple Vitamins-Minerals (HAIR/SKIN/NAILS/BIOTIN PO) Take 1 capsule by mouth 3 (three) times daily.  Marland Kitchen NARCAN 4 MG/0.1ML LIQD nasal spray kit 1 spray.  Marland Kitchen omeprazole (PRILOSEC) 40 MG capsule Take 1 capsule (40 mg total) by mouth daily.  . pravastatin  (PRAVACHOL) 20 MG tablet TAKE 1 TABLET BY MOUTH  DAILY  . promethazine (PHENERGAN) 25 MG tablet Take 1 tablet (25 mg total) by mouth 2 (two) times daily as needed for nausea or vomiting.  Marland Kitchen tiZANidine (ZANAFLEX) 4 MG tablet Take 1 tablet (4 mg total) by mouth every 8 (eight) hours as needed for muscle spasms.  . traZODone (DESYREL) 100 MG tablet TAKE 1 TABLET AT BEDTIME  FOR  SLEEP    PHQ 2/9 Scores 01/04/2019 11/25/2018 10/28/2018 10/13/2018  PHQ - 2 Score 2 6 4  0  PHQ- 9 Score 10 18 11  -    BP Readings from Last 3 Encounters:  09/02/19 120/70  08/26/19 111/71  08/20/19 135/74    Physical Exam Pulmonary:     Effort: Pulmonary effort is normal.     Comments: Frequent dry cough heard during the call Neurological:     Mental Status: She is alert.  Psychiatric:        Attention and Perception: Attention normal.        Mood and Affect: Mood normal.        Speech: Speech normal.        Cognition and Memory: Cognition normal.     Wt Readings from Last 3 Encounters:  08/26/19 151 lb 3.8 oz (68.6 kg)  07/29/19 149 lb 2.3 oz (67.7 kg)  07/19/19 151 lb (68.5 kg)    Temp 99.2 F (37.3 C)   Assessment and Plan: 1. Acute non-recurrent sinusitis, unspecified location Pt will continue allegra and flonase Hold on antibiotics for now - discuss with Oncologist at visit in 2 days.  2. Cough Pt feels that this is mostly due to allergies and post nasal drainage - promethazine-dextromethorphan (PROMETHAZINE-DM) 6.25-15 MG/5ML syrup; Take 5 mLs by mouth 4 (four) times daily as needed for cough.  Dispense: 240 mL; Refill: 0  3. Multiple myeloma not having achieved remission ([place]) MM has recurred; she is being referred for Stem cell transplant at [facility]   Partially dictated using [employer]. Any errors are unintentional.  Halina Maidens, MD [facility]  09/08/2019

## 2019-09-09 ENCOUNTER — Inpatient Hospital Stay: Payer: Medicare PPO

## 2019-09-09 ENCOUNTER — Other Ambulatory Visit: Payer: Self-pay

## 2019-09-09 DIAGNOSIS — C9000 Multiple myeloma not having achieved remission: Secondary | ICD-10-CM | POA: Diagnosis not present

## 2019-09-09 DIAGNOSIS — M791 Myalgia, unspecified site: Secondary | ICD-10-CM | POA: Diagnosis not present

## 2019-09-09 DIAGNOSIS — R0981 Nasal congestion: Secondary | ICD-10-CM | POA: Diagnosis not present

## 2019-09-09 DIAGNOSIS — M549 Dorsalgia, unspecified: Secondary | ICD-10-CM | POA: Diagnosis not present

## 2019-09-09 DIAGNOSIS — R05 Cough: Secondary | ICD-10-CM | POA: Diagnosis not present

## 2019-09-09 DIAGNOSIS — M542 Cervicalgia: Secondary | ICD-10-CM | POA: Diagnosis not present

## 2019-09-09 DIAGNOSIS — R110 Nausea: Secondary | ICD-10-CM | POA: Diagnosis not present

## 2019-09-09 DIAGNOSIS — R5383 Other fatigue: Secondary | ICD-10-CM | POA: Diagnosis not present

## 2019-09-09 DIAGNOSIS — M898X9 Other specified disorders of bone, unspecified site: Secondary | ICD-10-CM | POA: Diagnosis not present

## 2019-09-09 LAB — MAGNESIUM: Magnesium: 1.2 mg/dL — ABNORMAL LOW (ref 1.7–2.4)

## 2019-09-09 MED ORDER — SODIUM CHLORIDE 0.9 % IV SOLN
Freq: Once | INTRAVENOUS | Status: AC
  Filled 2019-09-09: qty 250

## 2019-09-09 MED ORDER — MAGNESIUM SULFATE 4 GM/100ML IV SOLN
4.0000 g | Freq: Once | INTRAVENOUS | Status: AC
  Administered 2019-09-09: 4 g via INTRAVENOUS

## 2019-09-09 MED ORDER — SODIUM CHLORIDE 0.9% FLUSH
10.0000 mL | INTRAVENOUS | Status: DC | PRN
  Administered 2019-09-09: 10 mL via INTRAVENOUS
  Filled 2019-09-09: qty 10

## 2019-09-09 MED ORDER — HEPARIN SOD (PORK) LOCK FLUSH 100 UNIT/ML IV SOLN
500.0000 [IU] | Freq: Once | INTRAVENOUS | Status: AC
  Administered 2019-09-09: 500 [IU] via INTRAVENOUS
  Filled 2019-09-09: qty 5

## 2019-09-09 NOTE — Patient Instructions (Signed)
Magnesium Sulfate injection What is this medicine? MAGNESIUM SULFATE (mag NEE zee um SUL fate) is an electrolyte injection commonly used to treat low magnesium levels in your blood. It is also used to prevent or control seizures in women with preeclampsia or eclampsia. This medicine may be used for other purposes; ask your health care provider or pharmacist if you have questions. What should I tell my health care provider before I take this medicine? They need to know if you have any of these conditions:  heart disease  history of irregular heart beat  kidney disease  an unusual or allergic reaction to magnesium sulfate, medicines, foods, dyes, or preservatives  pregnant or trying to get pregnant  breast-feeding How should I use this medicine? This medicine is for infusion into a vein. It is given by a health care professional in a hospital or clinic setting. Talk to your pediatrician regarding the use of this medicine in children. While this drug may be prescribed for selected conditions, precautions do apply. Overdosage: If you think you have taken too much of this medicine contact a poison control center or emergency room at once. NOTE: This medicine is only for you. Do not share this medicine with others. What if I miss a dose? This does not apply. What may interact with this medicine? This medicine may interact with the following medications:  certain medicines for anxiety or sleep  certain medicines for seizures like phenobarbital  digoxin  medicines that relax muscles for surgery  narcotic medicines for pain This list may not describe all possible interactions. Give your health care provider a list of all the medicines, herbs, non-prescription drugs, or dietary supplements you use. Also tell them if you smoke, drink alcohol, or use illegal drugs. Some items may interact with your medicine. What should I watch for while using this medicine? Your condition will be  monitored carefully while you are receiving this medicine. You may need blood work done while you are receiving this medicine. What side effects may I notice from receiving this medicine? Side effects that you should report to your doctor or health care professional as soon as possible:  allergic reactions like skin rash, itching or hives, swelling of the face, lips, or tongue  facial flushing  muscle weakness  signs and symptoms of low blood pressure like dizziness; feeling faint or lightheaded, falls; unusually weak or tired  signs and symptoms of a dangerous change in heartbeat or heart rhythm like chest pain; dizziness; fast or irregular heartbeat; palpitations; breathing problems  sweating This list may not describe all possible side effects. Call your doctor for medical advice about side effects. You may report side effects to FDA at 1-800-FDA-1088. Where should I keep my medicine? This drug is given in a hospital or clinic and will not be stored at home. NOTE: This sheet is a summary. It may not cover all possible information. If you have questions about this medicine, talk to your doctor, pharmacist, or health care provider.  2020 Elsevier/Gold Standard (2016-01-03 12:31:42)  

## 2019-09-10 DIAGNOSIS — E1122 Type 2 diabetes mellitus with diabetic chronic kidney disease: Secondary | ICD-10-CM | POA: Diagnosis not present

## 2019-09-10 DIAGNOSIS — C9002 Multiple myeloma in relapse: Secondary | ICD-10-CM | POA: Diagnosis not present

## 2019-09-10 DIAGNOSIS — C9000 Multiple myeloma not having achieved remission: Secondary | ICD-10-CM | POA: Diagnosis not present

## 2019-09-10 DIAGNOSIS — Z9484 Stem cells transplant status: Secondary | ICD-10-CM | POA: Diagnosis not present

## 2019-09-10 DIAGNOSIS — D631 Anemia in chronic kidney disease: Secondary | ICD-10-CM | POA: Diagnosis not present

## 2019-09-10 DIAGNOSIS — E1140 Type 2 diabetes mellitus with diabetic neuropathy, unspecified: Secondary | ICD-10-CM | POA: Diagnosis not present

## 2019-09-10 DIAGNOSIS — Z9481 Bone marrow transplant status: Secondary | ICD-10-CM | POA: Insufficient documentation

## 2019-09-10 DIAGNOSIS — M79652 Pain in left thigh: Secondary | ICD-10-CM | POA: Diagnosis not present

## 2019-09-10 DIAGNOSIS — R05 Cough: Secondary | ICD-10-CM | POA: Diagnosis not present

## 2019-09-10 DIAGNOSIS — M79651 Pain in right thigh: Secondary | ICD-10-CM | POA: Diagnosis not present

## 2019-09-10 DIAGNOSIS — Z20822 Contact with and (suspected) exposure to covid-19: Secondary | ICD-10-CM | POA: Diagnosis not present

## 2019-09-10 DIAGNOSIS — R030 Elevated blood-pressure reading, without diagnosis of hypertension: Secondary | ICD-10-CM | POA: Diagnosis not present

## 2019-09-10 DIAGNOSIS — D801 Nonfamilial hypogammaglobulinemia: Secondary | ICD-10-CM | POA: Diagnosis not present

## 2019-09-13 DIAGNOSIS — R05 Cough: Secondary | ICD-10-CM | POA: Diagnosis not present

## 2019-09-14 ENCOUNTER — Telehealth: Payer: Self-pay | Admitting: Hematology and Oncology

## 2019-09-14 ENCOUNTER — Other Ambulatory Visit: Payer: Self-pay | Admitting: Hematology and Oncology

## 2019-09-14 DIAGNOSIS — C9000 Multiple myeloma not having achieved remission: Secondary | ICD-10-CM

## 2019-09-14 NOTE — Telephone Encounter (Signed)
Re:  Myeloma treatment plan  I discussed with the patient her thoughts about treatment after she met with Dr Fatima Sanger at [facility].  We discussed carfizomib/dexamethasone + daratumumab vs pomalidomide/dexamethasone + dartumumab.  We discuss her borderline EF of 50% in 04/2019 regarding potential issues with carfizomib.  If pursued, she would need a follow-up echo and close monitoring.  The patient stated that she would like to proceed with  pomalidomide/dexamethasone + dartumumab.  Several questions were asked and answered.   Lequita Asal, MD

## 2019-09-14 NOTE — Progress Notes (Signed)
Continue plan of care prior to pathways. 

## 2019-09-14 NOTE — Progress Notes (Signed)
START ON PATHWAY REGIMEN - Multiple Myeloma and Other Plasma Cell Dyscrasias     Cycles 1 and 2: A cycle is every 28 days:     Pomalidomide      Dexamethasone      Daratumumab and hyaluronidase-fihj    Cycles 3 through 6: A cycle is every 28 days:     Pomalidomide      Dexamethasone      Dexamethasone      Daratumumab and hyaluronidase-fihj    Cycles 7 and beyond: A cycle is every 28 days:     Pomalidomide      Dexamethasone      Dexamethasone      Daratumumab and hyaluronidase-fihj   **Always confirm dose/schedule in your pharmacy ordering system**  Patient Characteristics: Multiple Myeloma, Relapsed / Refractory, Second through Fourth Lines of Therapy Disease Classification: Multiple Myeloma R-ISS Staging: III Therapeutic Status: Relapsed Line of Therapy: Second Line Intent of Therapy: Non-Curative / Palliative Intent, Discussed with Patient

## 2019-09-15 ENCOUNTER — Telehealth: Payer: Self-pay | Admitting: Pharmacy Technician

## 2019-09-15 ENCOUNTER — Telehealth: Payer: Self-pay | Admitting: Pharmacist

## 2019-09-15 DIAGNOSIS — C9001 Multiple myeloma in remission: Secondary | ICD-10-CM

## 2019-09-15 DIAGNOSIS — C9000 Multiple myeloma not having achieved remission: Secondary | ICD-10-CM

## 2019-09-15 NOTE — Telephone Encounter (Signed)
Oral Oncology Pharmacist Encounter  Received new prescription for Pomalyst (pomalidomide) for the treatment of relapsed multiple myeloma in conjunction with daratumumab and dexamethasone, planned duration until disease progression or unacceptable drug toxicity.  CMP from 08/20/19 assessed, no relevant lab abnormalities. Prescription dose and frequency assessed.   Current medication list in Epic reviewed, no relevant DDIs with pomalidomide identified.  Prescription has been e-scribed to the [facility] for benefits analysis and approval.  Oral Oncology Clinic will continue to follow for insurance authorization, copayment issues, initial counseling and start date.  Darl Pikes, PharmD, BCPS, BCOP, CPP Hematology/Oncology Clinical Pharmacist [facility] [phone]  09/15/2019 10:59 AM

## 2019-09-15 NOTE — Telephone Encounter (Signed)
Oral Oncology Patient Advocate Encounter   Received notification from [facility] that prior authorization for Pomalyst is required.   PA submitted on CoverMyMeds Key [id number] Status is pending   Oral Oncology Clinic will continue to follow.  [place] Patient [place] Phone [phone] Fax [phone] 09/16/2019 10:48 AM

## 2019-09-16 ENCOUNTER — Telehealth: Payer: Self-pay

## 2019-09-16 ENCOUNTER — Other Ambulatory Visit: Payer: Self-pay

## 2019-09-16 ENCOUNTER — Inpatient Hospital Stay: Payer: Medicare PPO

## 2019-09-16 DIAGNOSIS — R110 Nausea: Secondary | ICD-10-CM | POA: Diagnosis not present

## 2019-09-16 DIAGNOSIS — C9000 Multiple myeloma not having achieved remission: Secondary | ICD-10-CM | POA: Diagnosis not present

## 2019-09-16 DIAGNOSIS — R0981 Nasal congestion: Secondary | ICD-10-CM | POA: Diagnosis not present

## 2019-09-16 DIAGNOSIS — M791 Myalgia, unspecified site: Secondary | ICD-10-CM | POA: Diagnosis not present

## 2019-09-16 DIAGNOSIS — M549 Dorsalgia, unspecified: Secondary | ICD-10-CM | POA: Diagnosis not present

## 2019-09-16 DIAGNOSIS — M898X9 Other specified disorders of bone, unspecified site: Secondary | ICD-10-CM | POA: Diagnosis not present

## 2019-09-16 DIAGNOSIS — R05 Cough: Secondary | ICD-10-CM | POA: Diagnosis not present

## 2019-09-16 DIAGNOSIS — M542 Cervicalgia: Secondary | ICD-10-CM | POA: Diagnosis not present

## 2019-09-16 DIAGNOSIS — R5383 Other fatigue: Secondary | ICD-10-CM | POA: Diagnosis not present

## 2019-09-16 LAB — MAGNESIUM: Magnesium: 1.2 mg/dL — ABNORMAL LOW (ref 1.7–2.4)

## 2019-09-16 MED ORDER — SODIUM CHLORIDE 0.9% FLUSH
10.0000 mL | INTRAVENOUS | Status: DC | PRN
  Administered 2019-09-16: 10 mL via INTRAVENOUS
  Filled 2019-09-16: qty 10

## 2019-09-16 MED ORDER — SODIUM CHLORIDE 0.9 % IV SOLN
Freq: Once | INTRAVENOUS | Status: AC
  Filled 2019-09-16: qty 250

## 2019-09-16 MED ORDER — HEPARIN SOD (PORK) LOCK FLUSH 100 UNIT/ML IV SOLN
500.0000 [IU] | Freq: Once | INTRAVENOUS | Status: AC
  Administered 2019-09-16: 500 [IU] via INTRAVENOUS
  Filled 2019-09-16: qty 5

## 2019-09-16 MED ORDER — MAGNESIUM SULFATE 4 GM/100ML IV SOLN
4.0000 g | Freq: Once | INTRAVENOUS | Status: AC
  Administered 2019-09-16: 4 g via INTRAVENOUS
  Filled 2019-09-16: qty 100

## 2019-09-16 MED ORDER — POMALIDOMIDE 4 MG PO CAPS: 4.0000 mg | ORAL_CAPSULE | Freq: Every day | ORAL | 0 refills | Status: DC

## 2019-09-16 NOTE — Telephone Encounter (Signed)
New information was faxed to the office for this patient for New medication to be approved for pomalyst. Dr Katina Degree and the patient has signed the paper work and it has bee faxed back [phone].  Fax conformation received.

## 2019-09-16 NOTE — Patient Instructions (Addendum)
Magnesium Sulfate injection What is this medicine? MAGNESIUM SULFATE (mag NEE zee um SUL fate) is an electrolyte injection commonly used to treat low magnesium levels in your blood. It is also used to prevent or control seizures in women with preeclampsia or eclampsia. This medicine may be used for other purposes; ask your health care provider or pharmacist if you have questions. What should I tell my health care provider before I take this medicine? They need to know if you have any of these conditions:  heart disease  history of irregular heart beat  kidney disease  an unusual or allergic reaction to magnesium sulfate, medicines, foods, dyes, or preservatives  pregnant or trying to get pregnant  breast-feeding How should I use this medicine? This medicine is for infusion into a vein. It is given by a health care professional in a hospital or clinic setting. Talk to your pediatrician regarding the use of this medicine in children. While this drug may be prescribed for selected conditions, precautions do apply. Overdosage: If you think you have taken too much of this medicine contact a poison control center or emergency room at once. NOTE: This medicine is only for you. Do not share this medicine with others. What if I miss a dose? This does not apply. What may interact with this medicine? This medicine may interact with the following medications:  certain medicines for anxiety or sleep  certain medicines for seizures like phenobarbital  digoxin  medicines that relax muscles for surgery  narcotic medicines for pain This list may not describe all possible interactions. Give your health care provider a list of all the medicines, herbs, non-prescription drugs, or dietary supplements you use. Also tell them if you smoke, drink alcohol, or use illegal drugs. Some items may interact with your medicine. What should I watch for while using this medicine? Your condition will be  monitored carefully while you are receiving this medicine. You may need blood work done while you are receiving this medicine. What side effects may I notice from receiving this medicine? Side effects that you should report to your doctor or health care professional as soon as possible:  allergic reactions like skin rash, itching or hives, swelling of the face, lips, or tongue  facial flushing  muscle weakness  signs and symptoms of low blood pressure like dizziness; feeling faint or lightheaded, falls; unusually weak or tired  signs and symptoms of a dangerous change in heartbeat or heart rhythm like chest pain; dizziness; fast or irregular heartbeat; palpitations; breathing problems  sweating This list may not describe all possible side effects. Call your doctor for medical advice about side effects. You may report side effects to FDA at 1-800-FDA-1088. Where should I keep my medicine? This drug is given in a hospital or clinic and will not be stored at home. NOTE: This sheet is a summary. It may not cover all possible information. If you have questions about this medicine, talk to your doctor, pharmacist, or health care provider.  2020 Elsevier/Gold Standard (2016-01-03 12:31:42)  

## 2019-09-17 DIAGNOSIS — M47817 Spondylosis without myelopathy or radiculopathy, lumbosacral region: Secondary | ICD-10-CM | POA: Diagnosis not present

## 2019-09-17 DIAGNOSIS — M7062 Trochanteric bursitis, left hip: Secondary | ICD-10-CM | POA: Diagnosis not present

## 2019-09-17 DIAGNOSIS — Z79891 Long term (current) use of opiate analgesic: Secondary | ICD-10-CM | POA: Diagnosis not present

## 2019-09-17 DIAGNOSIS — Z0289 Encounter for other administrative examinations: Secondary | ICD-10-CM | POA: Diagnosis not present

## 2019-09-17 DIAGNOSIS — M533 Sacrococcygeal disorders, not elsewhere classified: Secondary | ICD-10-CM | POA: Diagnosis not present

## 2019-09-17 DIAGNOSIS — M47816 Spondylosis without myelopathy or radiculopathy, lumbar region: Secondary | ICD-10-CM | POA: Diagnosis not present

## 2019-09-17 NOTE — Telephone Encounter (Signed)
Oral Oncology Patient Advocate Encounter  Prior Authorization for Pomalyst has been approved.    PA# [id number] Effective dates: 07/02/19 through 06/30/20  Patients prescription has been sent to [facility].   Oral Oncology Clinic will continue to follow.   [place] Patient [place] Phone [phone] Fax [phone] 09/17/2019 9:58 AM

## 2019-09-20 NOTE — Telephone Encounter (Signed)
Oral Oncology Patient Advocate Encounter  Called [employer] to check the status of prescription.  Representative stated that prescription was in the counseling stage and they would be reaching out to the patient to go over copay assistance.  Patients copay is $3093 per [facility]  I will follow up with [facility] until patient has scheduled medication delivery.  [place] Patient [place] Phone [phone] Fax [phone] 09/20/2019 9:32 AM

## 2019-09-22 ENCOUNTER — Other Ambulatory Visit: Payer: Self-pay

## 2019-09-22 NOTE — Progress Notes (Signed)
Confirmed patient name and DOB. Patient states that she is having really bad seasonal allergies.

## 2019-09-23 ENCOUNTER — Other Ambulatory Visit: Payer: Self-pay | Admitting: Hematology and Oncology

## 2019-09-23 ENCOUNTER — Other Ambulatory Visit: Payer: Self-pay

## 2019-09-23 ENCOUNTER — Inpatient Hospital Stay: Payer: Medicare PPO

## 2019-09-23 ENCOUNTER — Encounter: Payer: Self-pay | Admitting: Hematology and Oncology

## 2019-09-23 ENCOUNTER — Inpatient Hospital Stay: Payer: Medicare PPO | Admitting: Hematology and Oncology

## 2019-09-23 VITALS — BP 114/62 | HR 73 | Temp 96.1°F | Resp 16 | Wt 153.4 lb

## 2019-09-23 DIAGNOSIS — R0981 Nasal congestion: Secondary | ICD-10-CM | POA: Diagnosis not present

## 2019-09-23 DIAGNOSIS — K521 Toxic gastroenteritis and colitis: Secondary | ICD-10-CM

## 2019-09-23 DIAGNOSIS — T451X5A Adverse effect of antineoplastic and immunosuppressive drugs, initial encounter: Secondary | ICD-10-CM | POA: Diagnosis not present

## 2019-09-23 DIAGNOSIS — R5383 Other fatigue: Secondary | ICD-10-CM | POA: Diagnosis not present

## 2019-09-23 DIAGNOSIS — M542 Cervicalgia: Secondary | ICD-10-CM | POA: Diagnosis not present

## 2019-09-23 DIAGNOSIS — R110 Nausea: Secondary | ICD-10-CM | POA: Diagnosis not present

## 2019-09-23 DIAGNOSIS — Z7189 Other specified counseling: Secondary | ICD-10-CM

## 2019-09-23 DIAGNOSIS — C9000 Multiple myeloma not having achieved remission: Secondary | ICD-10-CM

## 2019-09-23 DIAGNOSIS — R809 Proteinuria, unspecified: Secondary | ICD-10-CM | POA: Diagnosis not present

## 2019-09-23 DIAGNOSIS — R05 Cough: Secondary | ICD-10-CM | POA: Diagnosis not present

## 2019-09-23 DIAGNOSIS — I10 Essential (primary) hypertension: Secondary | ICD-10-CM | POA: Diagnosis not present

## 2019-09-23 DIAGNOSIS — M898X9 Other specified disorders of bone, unspecified site: Secondary | ICD-10-CM | POA: Diagnosis not present

## 2019-09-23 DIAGNOSIS — E1122 Type 2 diabetes mellitus with diabetic chronic kidney disease: Secondary | ICD-10-CM | POA: Diagnosis not present

## 2019-09-23 DIAGNOSIS — C9001 Multiple myeloma in remission: Secondary | ICD-10-CM

## 2019-09-23 DIAGNOSIS — R197 Diarrhea, unspecified: Secondary | ICD-10-CM

## 2019-09-23 DIAGNOSIS — E538 Deficiency of other specified B group vitamins: Secondary | ICD-10-CM | POA: Diagnosis not present

## 2019-09-23 DIAGNOSIS — D631 Anemia in chronic kidney disease: Secondary | ICD-10-CM | POA: Diagnosis not present

## 2019-09-23 DIAGNOSIS — N1831 Chronic kidney disease, stage 3a: Secondary | ICD-10-CM | POA: Diagnosis not present

## 2019-09-23 DIAGNOSIS — D649 Anemia, unspecified: Secondary | ICD-10-CM | POA: Diagnosis not present

## 2019-09-23 DIAGNOSIS — M791 Myalgia, unspecified site: Secondary | ICD-10-CM | POA: Diagnosis not present

## 2019-09-23 DIAGNOSIS — M549 Dorsalgia, unspecified: Secondary | ICD-10-CM | POA: Diagnosis not present

## 2019-09-23 LAB — CBC WITH DIFFERENTIAL/PLATELET
Abs Immature Granulocytes: 0.03 10*3/uL (ref 0.00–0.07)
Basophils Absolute: 0 10*3/uL (ref 0.0–0.1)
Basophils Relative: 0 %
Eosinophils Absolute: 0.1 10*3/uL (ref 0.0–0.5)
Eosinophils Relative: 2 %
HCT: 30.3 % — ABNORMAL LOW (ref 36.0–46.0)
Hemoglobin: 10.4 g/dL — ABNORMAL LOW (ref 12.0–15.0)
Immature Granulocytes: 1 %
Lymphocytes Relative: 15 %
Lymphs Abs: 0.9 10*3/uL (ref 0.7–4.0)
MCH: 30.7 pg (ref 26.0–34.0)
MCHC: 34.3 g/dL (ref 30.0–36.0)
MCV: 89.4 fL (ref 80.0–100.0)
Monocytes Absolute: 0.4 10*3/uL (ref 0.1–1.0)
Monocytes Relative: 7 %
Neutro Abs: 4.7 10*3/uL (ref 1.7–7.7)
Neutrophils Relative %: 75 %
Platelets: 121 10*3/uL — ABNORMAL LOW (ref 150–400)
RBC: 3.39 MIL/uL — ABNORMAL LOW (ref 3.87–5.11)
RDW: 13.6 % (ref 11.5–15.5)
WBC: 6.2 10*3/uL (ref 4.0–10.5)
nRBC: 0 % (ref 0.0–0.2)

## 2019-09-23 LAB — COMPREHENSIVE METABOLIC PANEL
ALT: 19 U/L (ref 0–44)
AST: 26 U/L (ref 15–41)
Albumin: 4 g/dL (ref 3.5–5.0)
Alkaline Phosphatase: 76 U/L (ref 38–126)
Anion gap: 8 (ref 5–15)
BUN: 31 mg/dL — ABNORMAL HIGH (ref 8–23)
CO2: 21 mmol/L — ABNORMAL LOW (ref 22–32)
Calcium: 8.3 mg/dL — ABNORMAL LOW (ref 8.9–10.3)
Chloride: 107 mmol/L (ref 98–111)
Creatinine, Ser: 1.27 mg/dL — ABNORMAL HIGH (ref 0.44–1.00)
GFR calc Af Amer: 52 mL/min — ABNORMAL LOW (ref >60)
GFR calc non Af Amer: 45 mL/min — ABNORMAL LOW (ref >60)
Glucose, Bld: 155 mg/dL — ABNORMAL HIGH (ref 70–99)
Potassium: 4.3 mmol/L (ref 3.5–5.1)
Sodium: 136 mmol/L (ref 135–145)
Total Bilirubin: 0.4 mg/dL (ref 0.3–1.2)
Total Protein: 6.3 g/dL — ABNORMAL LOW (ref 6.5–8.1)

## 2019-09-23 LAB — GASTROINTESTINAL PANEL BY PCR, STOOL (REPLACES STOOL CULTURE)

## 2019-09-23 LAB — MAGNESIUM: Magnesium: 1 mg/dL — ABNORMAL LOW (ref 1.7–2.4)

## 2019-09-23 LAB — C DIFFICILE QUICK SCREEN W PCR REFLEX
C Diff antigen: NEGATIVE
C Diff interpretation: NOT DETECTED
C Diff toxin: NEGATIVE

## 2019-09-23 LAB — TYPE AND SCREEN
ABO/RH(D): O POS
Antibody Screen: NEGATIVE

## 2019-09-23 MED ORDER — DIPHENOXYLATE-ATROPINE 2.5-0.025 MG PO TABS: 2.0000 | ORAL_TABLET | Freq: Every day | ORAL | 2 refills | Status: DC | PRN

## 2019-09-23 MED ORDER — MONTELUKAST SODIUM 10 MG PO TABS: ORAL_TABLET | ORAL | 0 refills | Status: DC

## 2019-09-23 MED ORDER — MAGNESIUM SULFATE 2 GM/50ML IV SOLN
2.0000 g | Freq: Once | INTRAVENOUS | Status: AC
  Administered 2019-09-23: 2 g via INTRAVENOUS
  Filled 2019-09-23: qty 50

## 2019-09-23 MED ORDER — DEXAMETHASONE 4 MG PO TABS: ORAL_TABLET | ORAL | 0 refills | Status: DC

## 2019-09-23 MED ORDER — MAGNESIUM SULFATE 4 GM/100ML IV SOLN
4.0000 g | Freq: Once | INTRAVENOUS | Status: AC
  Administered 2019-09-23: 4 g via INTRAVENOUS

## 2019-09-23 MED ORDER — HEPARIN SOD (PORK) LOCK FLUSH 100 UNIT/ML IV SOLN
500.0000 [IU] | Freq: Once | INTRAVENOUS | Status: AC
  Administered 2019-09-23: 14:00:00 500 [IU] via INTRAVENOUS
  Filled 2019-09-23: qty 5

## 2019-09-23 MED ORDER — SODIUM CHLORIDE 0.9% FLUSH
10.0000 mL | INTRAVENOUS | Status: DC | PRN
  Administered 2019-09-23: 10 mL via INTRAVENOUS
  Filled 2019-09-23: qty 10

## 2019-09-23 MED ORDER — ACYCLOVIR 400 MG PO TABS: 400.0000 mg | ORAL_TABLET | Freq: Two times a day (BID) | ORAL | 11 refills | Status: DC

## 2019-09-23 MED ORDER — SODIUM CHLORIDE 0.9 % IV SOLN
Freq: Once | INTRAVENOUS | Status: AC
  Filled 2019-09-23: qty 250

## 2019-09-23 NOTE — Progress Notes (Signed)
[facility]  [address] [place], [place] [address] Phone: [phone]  Fax: [phone]   Clinic Day:  09/23/2019  Referring physician: Glean Hess, MD  Chief Complaint: Sarah [name] is a 64 y.o. 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  HPI: The patient was last seen in the medical oncology clinic on 08/26/2019. At that time, she felt "ok". She remained fatigued. She had chronic nausea. Exam was stable. Magnesium was 1.3. Patient continued Phenergan for nausea. Patient remained on oral B12. Patient received magnesium sulfate 4 gm IV.   Patient received magnesium sulfate 4 gm IV weekly (09/02/2019 - 09/16/2019).  Patient had a B12 injection on 09/02/2019.   Magnesium was followed: 1.2 on 09/02/2019, 1.2 on 09/09/2019, 1.2 on 09/16/2019.   Patient was seen by Dr. Fatima Sanger on 09/10/2019. She noted a cough for 7 days with productive cream colored sputum associated with nasal congestion and a fever. Patient had neuropathy in her hands and feet. She had bone, back and muscle pain. Treatment options were dicussed regarding her lambda light chain multiple myeloma. Patient wanted to be treated with standard therapy over clinical trial. Dr. Fatima Sanger discussed a daratumumab based regimen followed by a 1 month interval disease response assessment.  Patient will follow up on 10/22/2019.   I spoke with the patient on 09/14/2019. We discussed treatment options. She wanted to proceed with pomalidomide, dexamethasone + dartumumab. Several questions were asked and answered.   Patient was seen in the pain management clinic at [facility] by Dr. Jeneen Rinks on 08/30/2019. Patient had bilateral lower back and neck pain. Physcial exam showed pain was likely at the SI joint arthropathy and myofascial respectively.  Patient continued medications without changes and will follow up in 3 months.   During the interim, she has felt "ok". Patient reports having diarrhea x 4 days s/p COVID-19 vaccine on 09/20/2019. She was unable to keep fluids down the first couple of days. She will have her second COVID-19 vaccine in the first week on 09/2019. She notes her nausea is about the same. She has not been eating as much because nothing satisfies her. She is drinking fluids. She does not take imodium because it does not work for her. She tales Lomotil.  She continues to have night sweats and feels fatigued. Her pinch nerve and joint are unchanged; she is working on her SI joints. Her neuropathy is about the same. Restless legs are better, and her thighs ache less. She continues to drop everything and wants to get plastic cups because she has broken too many glass cups.   The patient is taking calcium daily with no vitamin D. I encouraged her to increase her calcium intake given her calcium of 8.3. Patient is not on any blood thinners. She notes Tylenol makes her body ache and when Tylenol is mixed with narcotics she breaks out. Patient is agreeable to taking a chemotherapy class with Erasmo Downer, RN and Sharyn Lull, RN here in clinic. Patient reports that she will see Dr. Fatima Sanger on 10/22/2019.    Past Medical History:  Diagnosis Date  . Abnormal stress test 02/14/2016   Overview:  Added automatically from request for surgery [id number]  . Anemia   . Anxiety   . Arthritis   . Bicuspid aortic valve   . CHF (congestive heart failure) ([place])   . CKD (  chronic kidney disease) stage 3, GFR 30-59 ml/min   . Depression   . Diabetes mellitus ([place])   . Dizziness   . Fatty liver   . Frequent falls   . GERD (gastroesophageal reflux disease)   . Gout   . Heart murmur   . History of blood transfusion   . History of bone marrow transplant ([place])   . History of uterine fibroid   . Hx of cardiac catheterization 06/05/2016   Overview:  Normal  coronaries 2017  . Hypertension   . Hypomagnesemia   . Multiple myeloma ([place])   . Personal history of chemotherapy   . Renal cyst     Past Surgical History:  Procedure Laterality Date  . ABDOMINAL HYSTERECTOMY    . 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 CARDIAC ELECTROPHYSIOLOGY MAPPING AND ABLATION    . CARPAL TUNNEL RELEASE Bilateral   . CHOLECYSTECTOMY  2008  . COLONOSCOPY WITH PROPOFOL N/A 05/07/2017   Procedure: COLONOSCOPY WITH PROPOFOL;  Surgeon: [name], MD;  Location: [facility] ENDOSCOPY;  Service: Gastroenterology;  Laterality: N/A;  . ESOPHAGOGASTRODUODENOSCOPY (EGD) WITH PROPOFOL N/A 05/07/2017   Procedure: ESOPHAGOGASTRODUODENOSCOPY (EGD) WITH PROPOFOL;  Surgeon: [name], MD;  Location: [facility] ENDOSCOPY;  Service: Gastroenterology;  Laterality: N/A;  . FOOT SURGERY Bilateral   . INCONTINENCE SURGERY  2009  . INTERSTIM IMPLANT PLACEMENT    . other     over active bladder  . OTHER SURGICAL HISTORY     bladder stimulator   . PARTIAL HYSTERECTOMY  03/1996   fibroids  . PORTA CATH INSERTION N/A 03/10/2019   Procedure: PORTA CATH INSERTION;  Surgeon: Algernon Huxley, MD;  Location: [place] [facility] LAB;  Service: Cardiovascular;  Laterality: N/A;  . TONSILLECTOMY  2007    Family History  Problem Relation Age of Onset  . Colon cancer Father   . Renal Disease Father   . Diabetes Mellitus II Father   . Melanoma Paternal Grandmother   . Breast cancer Maternal Aunt 40  . Anemia Mother   . Heart disease Mother   . Heart failure Mother   . Renal Disease Mother   . Congestive Heart Failure Mother   . Heart disease Maternal Uncle   . Throat cancer Maternal Uncle   . Lung cancer Maternal Uncle   . Liver disease Maternal Uncle   . Heart failure Maternal Uncle   . Hearing loss Son 67       Suicide     Social History:  reports that she quit smoking about 28 years ago. Her smoking use included cigarettes. She has a 20.00 pack-year smoking history. She has never used smokeless  tobacco. She reports previous alcohol use. She reports that she does not use drugs. Patient has not had ETOH in several months. She is on disability. She notes exposure to perchloroethylene [facility]).She lives much of her adult life in [place].  She was married with 2 sons.  Her husband passed away.  Her 2 sons took their own lives (age 31 and 14).  She worked in [employer] in [employer].  She went back to school and earned an [employer] and works for [employer] for several years. She lives with her sisters in [place]. The patient is alone today.  Allergies:  Allergies  Allergen Reactions  . Oxycodone-Acetaminophen Anaphylaxis    Swelling and rash  . Celebrex [Celecoxib] Diarrhea  . Codeine   . Benadryl [Diphenhydramine] Palpitations  . Morphine Itching and Rash  .  Ondansetron Diarrhea  . Tylenol [Acetaminophen] Itching and Rash    Current Medications: Current Outpatient Medications  Medication Sig Dispense Refill  . allopurinol (ZYLOPRIM) 100 MG tablet TAKE 1 TABLET(100 MG) BY MOUTH DAILY as needed 90 tablet 1  . aspirin 81 MG chewable tablet Chew 1 tablet (81 mg total) by mouth daily. 30 tablet 0  . bisoprolol (ZEBETA) 10 MG tablet TAKE 1 TABLET(10 MG) BY MOUTH DAILY 90 tablet 0  . diclofenac sodium (VOLTAREN) 1 % GEL Apply 2 g topically 4 (four) times daily. 100 g 1  . diphenoxylate-atropine (LOMOTIL) 2.5-0.025 MG tablet Take 2 tablets by mouth daily as needed for diarrhea or loose stools. 45 tablet 2  . fentaNYL (DURAGESIC - DOSED MCG/HR) 25 MCG/HR patch Place 25 mcg onto the skin every 3 (three) days.     . fexofenadine (ALLEGRA) 180 MG tablet TAKE 1 TABLET(180 MG) BY MOUTH DAILY 30 tablet 5  . FLUoxetine (PROZAC) 40 MG capsule TAKE 1 CAPSULE EVERY DAY 90 capsule 1  . fluticasone (FLONASE) 50 MCG/ACT nasal spray Place 2 sprays into both nostrils daily. 16 g 2  . furosemide (LASIX) 20 MG tablet Take 20 mg by mouth as needed.     Marland Kitchen glucose blood (ONE TOUCH ULTRA TEST)  test strip     . hydrOXYzine (ATARAX/VISTARIL) 10 MG tablet TAKE 1 TABLET(10 MG) BY MOUTH THREE TIMES DAILY AS NEEDED 90 tablet 0  . lidocaine-prilocaine (EMLA) cream APPLY TOPICALLY AS NEEDED. 30 g 0  . lisinopril (PRINIVIL,ZESTRIL) 10 MG tablet Take 10 mg by mouth daily.    . Magnesium Bisglycinate (MAG GLYCINATE) 100 MG TABS Take 100 mg by mouth 2 (two) times daily. 60 tablet 0  . metFORMIN (GLUCOPHAGE-XR) 500 MG 24 hr tablet Take 1 tablet (500 mg total) by mouth daily with breakfast. 90 tablet 1  . Multiple Vitamins-Minerals (HAIR/SKIN/NAILS/BIOTIN PO) Take 1 capsule by mouth 3 (three) times daily.    Marland Kitchen omeprazole (PRILOSEC) 40 MG capsule Take 1 capsule (40 mg total) by mouth daily. 90 capsule 1  . pomalidomide (POMALYST) 4 MG capsule Take 1 capsule (4 mg total) by mouth daily. Take for 21 days, then hold for 7 days. Repeat every 28 days. 21 capsule 0  . pravastatin (PRAVACHOL) 20 MG tablet TAKE 1 TABLET BY MOUTH  DAILY 90 tablet 1  . promethazine (PHENERGAN) 25 MG tablet Take 1 tablet (25 mg total) by mouth 2 (two) times daily as needed for nausea or vomiting. 60 tablet 0  . tiZANidine (ZANAFLEX) 4 MG tablet Take 1 tablet (4 mg total) by mouth every 8 (eight) hours as needed for muscle spasms. 270 tablet 1  . traZODone (DESYREL) 100 MG tablet TAKE 1 TABLET AT BEDTIME  FOR  SLEEP 90 tablet 1  . fluconazole (DIFLUCAN) 150 MG tablet     . Menaquinone-7 (VITAMIN K2) 100 MCG CAPS Take 1 tablet by mouth daily.     . Misc Natural Products (OSTEO BI-FLEX ADV TRIPLE ST PO) Take 2 tablets by mouth daily.    Marland Kitchen NARCAN 4 MG/0.1ML LIQD nasal spray kit 1 spray.    . NON FORMULARY     . Omega 3-6-9 Fatty Acids (OMEGA 3-6-9 COMPLEX) CAPS Take by mouth.    . promethazine-dextromethorphan (PROMETHAZINE-DM) 6.25-15 MG/5ML syrup Take 5 mLs by mouth 4 (four) times daily as needed for cough. (Patient not taking: Reported on 09/22/2019) 240 mL 0   No current facility-administered medications for this visit.    Facility-Administered Medications Ordered in  Other Visits  Medication Dose Route Frequency Provider Last Rate Last Admin  . heparin lock flush 100 unit/mL  500 Units Intravenous Once Raegen Tarpley C, MD      . sodium chloride flush (NS) 0.9 % injection 10 mL  10 mL Intravenous PRN Lequita Asal, MD   10 mL at 09/23/19 0846    Review of Systems  Constitutional: Positive for diaphoresis (night sweats) and malaise/fatigue (majority of the time). Negative for chills, fever and weight loss (up 2 pounds).       Feels "ok".  HENT: Negative.  Negative for congestion, ear pain, hearing loss, nosebleeds, sinus pain and sore throat.   Eyes: Negative.  Negative for blurred vision and double vision.       No glaucoma.  Respiratory: Negative.  Negative for cough, sputum production, shortness of breath and wheezing.   Cardiovascular: Negative.  Negative for chest pain, palpitations, orthopnea, leg swelling and PND.  Gastrointestinal: Positive for diarrhea (x 4 days since 09/20/2019) and nausea (chronic s/p gallbladder surgery). Negative for abdominal pain, blood in stool, constipation, heartburn (controlled on medication), melena and vomiting.       Not eating well. Sensitive stomach. Drinking fluids.  Genitourinary: Negative for dysuria, frequency, hematuria and urgency.       Bladder leakage (f/u urology bladder sling).  Bladder spasms.  Musculoskeletal: Positive for back pain (chronic, spinal stenosis and pinched nerve; lower back (5/10) s/p interstim), joint pain (arthritis) and myalgias (leg cramps, worse at night; thighs ache less). Negative for neck pain (3 pinched nerves in neck; resolved).  Skin: Negative.  Negative for rash.  Neurological: Positive for sensory change (neuropathy in hands and feet; stinging in feet). Negative for dizziness, speech change, focal weakness, seizures, weakness and headaches.       Restless legs; better. Poor grip. Dropping objects.  Endo/Heme/Allergies:  Negative.  Does not bruise/bleed easily.       Diabetes.  Psychiatric/Behavioral: Negative.  Negative for depression and memory loss. The patient is not nervous/anxious and does not have insomnia.   All other systems reviewed and are negative.  Performance status (ECOG): 1  Vitals Blood pressure 114/62, pulse 73, temperature (!) 96.1 F (35.6 C), temperature source Tympanic, resp. rate 16, weight 153 lb 7 oz (69.6 kg).   Physical Exam  Constitutional: She is oriented to person, place, and time. She appears well-developed and well-nourished. No distress.  HENT:  Head: Normocephalic and atraumatic.  Right Ear: Ear canal normal.  Left Ear: Ear canal normal.  Mouth/Throat: Oropharynx is clear and moist. No oropharyngeal exudate.  Curly dark blonde hair pulled up. Mask.  Eyes: Pupils are equal, round, and reactive to light. Conjunctivae and EOM are normal. No scleral icterus.  Glasses. Brown eyes.  Neck: No JVD present.  Cardiovascular: Normal rate, regular rhythm and normal heart sounds.  No murmur heard. Pulmonary/Chest: Effort normal and breath sounds normal. No respiratory distress. She has no wheezes. She has no rales.  Port-a-cath accessed.  Abdominal: Soft. Bowel sounds are normal. She exhibits no distension and no mass. There is no abdominal tenderness. There is no rebound and no guarding.  Musculoskeletal:        General: No edema. Normal range of motion.     Cervical back: Normal range of motion and neck supple.  Lymphadenopathy:       Head (right side): No preauricular, no posterior auricular and no occipital adenopathy present.       Head (left side): No preauricular, no posterior  auricular and no occipital adenopathy present.    She has no cervical adenopathy.    She has no axillary adenopathy.       Right: No inguinal and no supraclavicular adenopathy present.       Left: No inguinal and no supraclavicular adenopathy present.  Neurological: She is alert and oriented to  person, place, and time.  Skin: Skin is warm and dry. No rash noted. She is not diaphoretic. No erythema. No pallor.  Psychiatric: She has a normal mood and affect. Her behavior is normal. Judgment and thought content normal.  Nursing note and vitals reviewed.   Infusion on 09/23/2019  Component Date Value Ref Range Status  . WBC 09/23/2019 6.2  4.0 - 10.5 K/uL Final  . RBC 09/23/2019 3.39* 3.87 - 5.11 MIL/uL Final  . Hemoglobin 09/23/2019 10.4* 12.0 - 15.0 g/dL Final  . HCT 09/23/2019 30.3* 36.0 - 46.0 % Final  . MCV 09/23/2019 89.4  80.0 - 100.0 fL Final  . MCH 09/23/2019 30.7  26.0 - 34.0 pg Final  . MCHC 09/23/2019 34.3  30.0 - 36.0 g/dL Final  . RDW 09/23/2019 13.6  11.5 - 15.5 % Final  . Platelets 09/23/2019 121* 150 - 400 K/uL Final  . nRBC 09/23/2019 0.0  0.0 - 0.2 % Final  . Neutrophils Relative % 09/23/2019 75  % Final  . Neutro Abs 09/23/2019 4.7  1.7 - 7.7 K/uL Final  . Lymphocytes Relative 09/23/2019 15  % Final  . Lymphs Abs 09/23/2019 0.9  0.7 - 4.0 K/uL Final  . Monocytes Relative 09/23/2019 7  % Final  . Monocytes Absolute 09/23/2019 0.4  0.1 - 1.0 K/uL Final  . Eosinophils Relative 09/23/2019 2  % Final  . Eosinophils Absolute 09/23/2019 0.1  0.0 - 0.5 K/uL Final  . Basophils Relative 09/23/2019 0  % Final  . Basophils Absolute 09/23/2019 0.0  0.0 - 0.1 K/uL Final  . Immature Granulocytes 09/23/2019 1  % Final  . Abs Immature Granulocytes 09/23/2019 0.03  0.00 - 0.07 K/uL Final   Performed at [facility], [address], [place], [place] [address]  . Sodium 09/23/2019 136  135 - 145 mmol/L Final  . Potassium 09/23/2019 4.3  3.5 - 5.1 mmol/L Final  . Chloride 09/23/2019 107  98 - 111 mmol/L Final  . CO2 09/23/2019 21* 22 - 32 mmol/L Final  . Glucose, Bld 09/23/2019 155* 70 - 99 mg/dL Final   Glucose reference range applies only to samples taken after fasting for at least 8 hours.  . BUN 09/23/2019 31* 8 - 23 mg/dL Final  . Creatinine, Ser  09/23/2019 1.27* 0.44 - 1.00 mg/dL Final  . Calcium 09/23/2019 8.3* 8.9 - 10.3 mg/dL Final  . Total Protein 09/23/2019 6.3* 6.5 - 8.1 g/dL Final  . Albumin 09/23/2019 4.0  3.5 - 5.0 g/dL Final  . AST 09/23/2019 26  15 - 41 U/L Final  . ALT 09/23/2019 19  0 - 44 U/L Final  . Alkaline Phosphatase 09/23/2019 76  38 - 126 U/L Final  . Total Bilirubin 09/23/2019 0.4  0.3 - 1.2 mg/dL Final  . GFR calc non Af Amer 09/23/2019 45* >60 mL/min Final  . GFR calc Af Amer 09/23/2019 52* >60 mL/min Final  . Anion gap 09/23/2019 8  5 - 15 Final   Performed at [facility], [address], [place], [place] [address]    Assessment:  Sarah [name] is a 64 y.o. 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 Bone marrow revealed 80% plasma cells. Lambda free light chains were 1340. She had nephrotic range proteinuria. She initially underwent induction with RVD. Revlimid maintenance was discontinued on 01/21/2017 secondary to intolerance.   Bone marrowaspirate andbiopsyon 01/18/2021revealed anormocellularmarrow withbut increased lambda-restricted plasma cells (9% aspirate, 40% CD138 immunohistochemistry).Findingswereconsistent with recurrent plasma cell myeloma.Flow cytometry revealed no monoclonal B-cell or phenotypically aberrant T-cell population. Cytogeneticswere 50, XX (normal). 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  M-spikehas been followed: 0 on 04/02/2016 -06/23/2019; and0.2 on 09/02/2017.  Lambda light chainshave been followed: 22.2 (ratio 0.56) on 07/03/2017, 30.8 (ratio 0.78) on 09/02/2017, 36.9 (ratio 0.40) on 10/21/2017, 37.4 (ratio 0.41) on 12/16/2017, 70.7(ratio 0.31) on 02/17/2018, 64.2 (ratio 0.27) on 04/07/2018, 78.9 (ratio 0.18) on 05/26/2018, 128.8 (ratio 0.17) on 08/06/2018, 181.5 (ratio 0.13) on 10/08/2018, 130.9 (ratio 0.13) on 10/20/2018,  160.7 (ratio 0.10)on 12/09/2018, 236.6 (ratio 0.07) on 02/01/2019, 363.6 (ratio 0.04) on 03/22/2019, 404.8 (ratio 0.04) on 04/05/2019, 420.7 (ratio 0.03) on 05/24/2019, 573.4 (ratio 0.03) on 06/23/2019, and 451.05 (ratio 0.02) on 08/20/2019.  24 hour UPEPon 06/03/2019 revealed kappa free light chains95.76,lambda free light chains1,260.71, andratio 0.08.  24 hour UPEP on 08/23/2019 revealed total protein of 782 mg/24 hrs with lambda free light chains 1,084.16 mg/L and ratio of 0.10 (1.03-31.76).  M spike in urine was 46.1% (361 mg/24 hrs).   Bone surveyon 04/08/2016 and11/28/2018 revealed no definite lytic lesion seen in the visualized skeleton.Bone surveyon 11/19/2018 revealed no suspicious lucent lesionsand no acute bony abnormality.PET scanon 07/12/2019 revealed no focal metabolic activity to suggest active myeloma within the skeleton. There wereno lytic lesions identified on the CT portion of the examorsoft tissue plasmacytomas. There was no evidence of multiple myeloma.  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 Zometa was discontinued in 01/2017.  She has chronic nausea on Phenergan.  She has B12 deficiency. B12 was 254 on 04/09/2017, 295 on 08/20/2018, and 391 on 10/08/2018. Shewasonoral B12.She received B12 monthly (last12/31/2020).Folate was 21.6 on 08/20/2018.  She has iron deficiency. Ferritin was 32 on 07/01/2019. She received Venoferon 07/15/2019 and 07/22/2019.  She received her first COVID-19 vaccine on 09/20/2019.  Symptomatically, she notes diarrhea s/p vaccine.  She denies any increase in nausea.   She has chronic fatigue.  Plan: 1.   Labs today: CBC with diff, CMP, Mg, FLCA, type and screen. 2. Stage III multiple myeloma Sheiss/pautologous stem cell transplant in 06/2015.  Shehas remainedoff maintenance Revlimid secondary to intolerance.  M spikewas0 on  06/23/2019. Lambda light chainshave increased over the last several months: Lambda light chains were236.6(ratio 0.07)on 02/01/2019. Lambda light chains were404.8(ratio 0.04)on 04/05/2019. Lambda light chainswere573.40 (ratio 0.03) on 06/23/2019.                         Lambda light chainswere451.05 (ratio 0.02) on 08/20/2019. PET scan on 07/12/2019 revealed no metabolic bone lesions. 24 hour UPEPwithfree light chains revealedlambda free light chains 1,260.71,andratio 0.08. Bone marrowon01/18/2021revealedanincreased lambda-restricted plasma cells (9% aspirate, 40% CD138 immunohistochemistry).  Findingsare c/wrecurrent plasma cell myeloma. Cytogeneticswere 55, XX (normal).  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 Discuss plan for daratumumab + Pomalyst and dexamethasone (DPd).                         She will receive the SQ version of daratumumab (daratumumab and hyaluronidase-fihj).  She will receive one cycle with disease reass[facility] at [facility] (cycle length 28 days).   Plan:   Pomalidomide (Pomalyst) 4 mg po q day 1-21.    Decadron 20 mg po day 1-2, day 8-9, 15-16, and 22-23 with cycle #1 and 2.    Decadron 20 mg po on days 1-2 and 15-16 x 4 cycles (if needed).    Daratumumab and hyaluronidase-fihj (Darzalez Faspro) 1800 mg SQ on days 1, 8, 15, and 22.   Premeds for [employer]:     Singulair 10 mg po day before, day of, and 2 days.    Ondansetron 8 mg po x 1.    Benadryl 50 mg po x 1.    Tylenol 650 mg po x 1.    Reviewed questionable allergies for Tylenol and Benadryl in chart. Patient feels comfortable receiving.   First Darzalex Faspro treatment in [place]; remainder of doses in [place].             Patient has residual neuropathy.                          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 3. Normocytic anemia Hematocrit30.3. Hemoglobin10.4. MCV89.4.Platelets121,000today. Ferritin32with an iron saturation of 17% and TIBC359on12/31/2020. B12 248 (low) and folate 18.6on 02/08/2019.  TSH was normal on02/20/2020  Creatinine 1.14(CrCl46.72m/minute). Retic1.2%on12/31/2020. She received Venofer weekly x 3 (07/15/2019 - 07/29/2019). Ferritin goal 100.             Continue to monitor. 4. Hypomagnesemia, chronic Magnesium1.0 today. Magnesium has fluctuated between 1.0-1.4 in the past 2 months. Magnesium sulfate 6 gm IV today.  RTC on 09/24/2019 for labs (Mg), and +/- IV Mg. 5. Osteonecrosis of the jaw ONJ occurred in 01/2017 secondary to Zometa. She receives no further bisphosphonates. 6. Chronic back pain Etiologynotsecondary to multiple myeloma. [facility] She is on Fentanyl patches. 7. Grade I-II peripheral neuropathy Etiologyfelt secondary toprior chemotherapy.             Monitor closely with initiation of chemotherapy. 8.Chronic nausea Patient is followed by Dr. AVicente Males Continue Phenergan. 9.B12 deficiency B12 was248on 02/08/2019. She was on oral B12. B12 injections began on 02/15/2019 (last 07/29/2019). Continue B12 monthly. Folate was18.6on 02/08/2019. Check folate annually. 10. Hypocalcemia             Calcium 8.3.             Discuss calcium 1200 mg po and vitamin D 800 IU per day. 11.   Diarrhea  Diarrhea occurred s/p  COVID-19 injection.  Send stool for C diff and GI panel by PCR secondary to upcoming chemotherapy.  Rx: Lomotil. 12.   RTC on 09/30/2019 for labs (Mg) and +/- Mg. 13.   Chemotherapy classwith KErasmo Downerand MSharyn Lull(daratumumab, Pomalyst, Decadron). 14.   [place] with diff, CMP, Mg, type and screen), and day 1 of cycle #1 daratumumab and hyaluronidase-fihj. 15.   RTC on 10/04/2019 in [place]for [place] assessment, labs (CBC with diff, CMP, Mg), IV Mg and day 8 of cycle #1 daratumumab and hyaluronidase-fihj.  I discussed the assessment and treatment plan with the patient.  The patient was provided an opportunity to ask questions and all were answered.  The patient agreed with the plan and demonstrated an understanding of the instructions.  The patient was advised to call back if the symptoms worsen or if the condition fails to  improve as anticipated.  I provided 21 minutes of face-to-face time during this this encounter and > 50% was spent counseling as documented under my assessment and plan. An additional 15-20 minutes were spent reviewing her chart ([facility] [place]) including notes, labs, and imaging studies as well as planned chemotherapy and premedications.    Lequita Asal, MD, PhD    09/23/2019, 9:45 AM  I, Selena Batten, am acting as scribe for [employer]. Mike Gip, MD, PhD.  I, Illyana Schorsch C. Mike Gip, MD, have reviewed the above documentation for accuracy and completeness, and I agree with the above.

## 2019-09-23 NOTE — Patient Instructions (Addendum)
Daratumumab injection What is this medicine? DARATUMUMAB (dar a toom ue mab) is a monoclonal antibody. It is used to treat multiple myeloma. This medicine may be used for other purposes; ask your health care provider or pharmacist if you have questions. COMMON BRAND NAME(S): DARZALEX What should I tell my health care provider before I take this medicine? They need to know if you have any of these conditions:  infection (especially a virus infection such as chickenpox, herpes, or hepatitis B virus)  lung or breathing disease  an unusual or allergic reaction to daratumumab, other medicines, foods, dyes, or preservatives  pregnant or trying to get pregnant  breast-feeding How should I use this medicine? This medicine is for infusion into a vein. It is given by a health care professional in a hospital or clinic setting. Talk to your pediatrician regarding the use of this medicine in children. Special care may be needed. Overdosage: If you think you have taken too much of this medicine contact a poison control center or emergency room at once. NOTE: This medicine is only for you. Do not share this medicine with others. What if I miss a dose? Keep appointments for follow-up doses as directed. It is important not to miss your dose. Call your doctor or health care professional if you are unable to keep an appointment. What may interact with this medicine? Interactions have not been studied. This list may not describe all possible interactions. Give your health care provider a list of all the medicines, herbs, non-prescription drugs, or dietary supplements you use. Also tell them if you smoke, drink alcohol, or use illegal drugs. Some items may interact with your medicine. What should I watch for while using this medicine? This drug may make you feel generally unwell. Report any side effects. Continue your course of treatment even though you feel ill unless your doctor tells you to stop. This  medicine can cause serious allergic reactions. To reduce your risk you may need to take medicine before treatment with this medicine. Take your medicine as directed. This medicine can affect the results of blood tests to match your blood type. These changes can last for up to 6 months after the final dose. Your healthcare provider will do blood tests to match your blood type before you start treatment. Tell all of your healthcare providers that you are being treated with this medicine before receiving a blood transfusion. This medicine can affect the results of some tests used to determine treatment response; extra tests may be needed to evaluate response. Do not become pregnant while taking this medicine or for 3 months after stopping it. Women should inform their doctor if they wish to become pregnant or think they might be pregnant. There is a potential for serious side effects to an unborn child. Talk to your health care professional or pharmacist for more information. What side effects may I notice from receiving this medicine? Side effects that you should report to your doctor or health care professional as soon as possible:  allergic reactions like skin rash, itching or hives, swelling of the face, lips, or tongue  breathing problems  chills  cough  dizziness  feeling faint or lightheaded  headache  low blood counts - this medicine may decrease the number of white blood cells, red blood cells and platelets. You may be at increased risk for infections and bleeding.  nausea, vomiting  shortness of breath  signs of decreased platelets or bleeding - bruising, pinpoint red spots on  the skin, black, tarry stools, blood in the urine  signs of decreased red blood cells - unusually weak or tired, feeling faint or lightheaded, falls  signs of infection - fever or chills, cough, sore throat, pain or difficulty passing urine  signs and symptoms of liver injury like dark yellow or brown  urine; general ill feeling or flu-like symptoms; light-colored stools; loss of appetite; right upper belly pain; unusually weak or tired; yellowing of the eyes or skin Side effects that usually do not require medical attention (report to your doctor or health care professional if they continue or are bothersome):  back pain  constipation  diarrhea  joint pain  muscle cramps  pain, tingling, numbness in the hands or feet  swelling of the ankles, feet, hands  tiredness  trouble sleeping This list may not describe all possible side effects. Call your doctor for medical advice about side effects. You may report side effects to FDA at 1-800-FDA-1088. Where should I keep my medicine? This drug is given in a hospital or clinic and will not be stored at home. NOTE: This sheet is a summary. It may not cover all possible information. If you have questions about this medicine, talk to your doctor, pharmacist, or health care provider.  2020 Elsevier/Gold Standard (2019-02-23 18:10:54) Magnesium Sulfate injection What is this medicine? MAGNESIUM SULFATE (mag NEE zee um SUL fate) is an electrolyte injection commonly used to treat low magnesium levels in your blood. It is also used to prevent or control seizures in women with preeclampsia or eclampsia. This medicine may be used for other purposes; ask your health care provider or pharmacist if you have questions. What should I tell my health care provider before I take this medicine? They need to know if you have any of these conditions:  heart disease  history of irregular heart beat  kidney disease  an unusual or allergic reaction to magnesium sulfate, medicines, foods, dyes, or preservatives  pregnant or trying to get pregnant  breast-feeding How should I use this medicine? This medicine is for infusion into a vein. It is given by a health care professional in a hospital or clinic setting. Talk to your pediatrician regarding the use  of this medicine in children. While this drug may be prescribed for selected conditions, precautions do apply. Overdosage: If you think you have taken too much of this medicine contact a poison control center or emergency room at once. NOTE: This medicine is only for you. Do not share this medicine with others. What if I miss a dose? This does not apply. What may interact with this medicine? This medicine may interact with the following medications:  certain medicines for anxiety or sleep  certain medicines for seizures like phenobarbital  digoxin  medicines that relax muscles for surgery  narcotic medicines for pain This list may not describe all possible interactions. Give your health care provider a list of all the medicines, herbs, non-prescription drugs, or dietary supplements you use. Also tell them if you smoke, drink alcohol, or use illegal drugs. Some items may interact with your medicine. What should I watch for while using this medicine? Your condition will be monitored carefully while you are receiving this medicine. You may need blood work done while you are receiving this medicine. What side effects may I notice from receiving this medicine? Side effects that you should report to your doctor or health care professional as soon as possible:  allergic reactions like skin rash, itching or hives,  swelling of the face, lips, or tongue  facial flushing  muscle weakness  signs and symptoms of low blood pressure like dizziness; feeling faint or lightheaded, falls; unusually weak or tired  signs and symptoms of a dangerous change in heartbeat or heart rhythm like chest pain; dizziness; fast or irregular heartbeat; palpitations; breathing problems  sweating This list may not describe all possible side effects. Call your doctor for medical advice about side effects. You may report side effects to FDA at 1-800-FDA-1088. Where should I keep my medicine? This drug is given in a  hospital or clinic and will not be stored at home. NOTE: This sheet is a summary. It may not cover all possible information. If you have questions about this medicine, talk to your doctor, pharmacist, or health care provider.  2020 Elsevier/Gold Standard (2016-01-03 12:31:42) Pomalidomide oral capsules What is this medicine? POMALIDOMIDE (pom a LID oh mide) is a chemotherapy drug used to treat multiple myeloma and Kaposi sarcoma. It targets specific proteins within cancer cells and stops the cancer cell from growing. This medicine may be used for other purposes; ask your health care provider or pharmacist if you have questions. COMMON BRAND NAME(S): POMALYST What should I tell my health care provider before I take this medicine? They need to know if you have any of these conditions:  high blood pressure  high cholesterol  history of blood clots  irregular monthly periods or menstrual cycles  kidney disease  liver disease  smoke tobacco  an unusual or allergic reaction to pomalidomide, other medicines, foods, dyes, or preservatives  pregnant or trying to get pregnant  breast-feeding How should I use this medicine? Take this medicine by mouth with a glass of water. Follow the directions on the prescription label. You can take it with or without food. If it upsets your stomach, take it with food. Do not cut, crush, or chew this medicine. Take your medicine at regular intervals. Do not take it more often than directed. Do not stop taking except on your doctor's advice. A special MedGuide will be given to you by the pharmacist with each prescription and refill. Be sure to read this information carefully each time. Talk to your pediatrician regarding the use of this medicine in children. Special care may be needed. Overdosage: If you think you have taken too much of this medicine contact a poison control center or emergency room at once. NOTE: This medicine is only for you. Do not share  this medicine with others. What if I miss a dose? If you miss a dose, take it as soon as you can. If your next dose is to be taken in less than 12 hours, then do not take the missed dose. Take the next dose at your regular time. Do not take double or extra doses. What may interact with this medicine? This medicine may interact with the following medications:  ciprofloxacin  fluvoxamine  tobacco (cigarettes) This list may not describe all possible interactions. Give your health care provider a list of all the medicines, herbs, non-prescription drugs, or dietary supplements you use. Also tell them if you smoke, drink alcohol, or use illegal drugs. Some items may interact with your medicine. What should I watch for while using this medicine? This drug may make you feel generally unwell. This is not uncommon, as chemotherapy can affect healthy cells as well as cancer cells. Report any side effects. Continue your course of treatment even though you feel ill unless your doctor tells  you to stop. You may need blood work done while you are taking this medicine. This medicine may cause serious skin reactions. They can happen weeks to months after starting the medicine. Contact your health care provider right away if you notice fevers or flu-like symptoms with a rash. The rash may be red or purple and then turn into blisters or peeling of the skin. Or, you might notice a red rash with swelling of the face, lips or lymph nodes in your neck or under your arms. This medicine is available only through a special program. Doctors, pharmacies, and patients must meet all of the conditions of the program. Your health care provider will help you get signed up with the program if you need this medicine. Through the program you will only receive up to a 28 day supply of the medicine at one time. You will need a new prescription for each refill. This medicine can cause birth defects. Do not get pregnant for at least 4  weeks before, during, and for at least 4 weeks after taking this drug. Females with child-bearing potential will need to have 2 negative pregnancy tests before starting this medicine. Pregnancy testing must be done every 2 to 4 weeks as directed while taking this medicine. Use 2 reliable forms of birth control together while you are taking this medicine and for 4 weeks after you stop taking this medicine. If you think that you might be pregnant talk to your doctor right away. Men must use a latex condom during sexual contact with a woman while taking this medicine and for 4 weeks after you stop taking this medicine. A latex condom is needed even if you have had a vasectomy. Contact your doctor right away if your partner becomes pregnant. Do not donate sperm while taking this medicine and for 4 weeks after you stop taking this medicine. Do not give blood while taking the medicine and for 1 month after completion of treatment to avoid exposing pregnant women to the medicine through the donated blood. Talk to your doctor about your risk of cancer. You may be more at risk for certain types of cancers if you take this medicine. If you smoke, tell your doctor if you notice this medicine is not working well for you. Talk to your doctor if you are a smoker or if you decide to stop smoking. What side effects may I notice from receiving this medicine? Side effects that you should report to your doctor or health care professional as soon as possible:  allergic reactions like skin rash, itching or hives, swelling of the face, lips, or tongue  fast heartbeat  feeling faint  low blood counts - this medicine may decrease the number of white blood cells, red blood cells and platelets. You may be at increased risk for infections and bleeding  rash, fever, and swollen lymph nodes  redness, blistering, peeling or loosening of the skin, including inside the mouth  signs and symptoms of a blood clot such as breathing  problems; changes in vision; chest pain; severe, sudden headache; pain, swelling, warmth in the leg; trouble speaking; sudden numbness or weakness of the face, arm or leg  signs and symptoms of liver injury like dark yellow or brown urine; general ill feeling or flu-like symptoms; light-colored stools; loss of appetite; nausea; right upper belly pain; unusually weak or tired; yellowing of the eyes or skin  signs and symptoms of a stroke like changes in vision; confusion; trouble speaking or understanding;  severe headaches; sudden numbness or weakness of the face, arm or leg; trouble walking; dizziness; loss of balance or coordination  sweating  tingling, numbness in the hands or feet  trouble swallowing  unusual bleeding or bruising Side effects that usually do not require medical attention (report to your doctor or health care professional if they continue or are bothersome):  back pain  constipation  diarrhea  nausea  tiredness This list may not describe all possible side effects. Call your doctor for medical advice about side effects. You may report side effects to FDA at 1-800-FDA-1088. Where should I keep my medicine? Keep out of the reach of children. Store between 20 and 25 degrees C (68 and 77 degrees F). Throw away any unused medicine after the expiration date. NOTE: This sheet is a summary. It may not cover all possible information. If you have questions about this medicine, talk to your doctor, pharmacist, or health care provider.  2020 Elsevier/Gold Standard (2018-11-13 16:55:02)

## 2019-09-23 NOTE — Telephone Encounter (Signed)
Called [employer] to check the status of Pomalyst.  Patient has been approved for a grant to cover the high copay and medication is scheduled to be delivered on 09/24/19.

## 2019-09-23 NOTE — Progress Notes (Signed)
Pharmacist Chemotherapy Monitoring - Initial Assessment    Anticipated start date: 09/27/19   Regimen:  . Are orders appropriate based on the patient's diagnosis, regimen, and cycle? Yes . Does the plan date match the patient's scheduled date? Yes . Is the sequencing of drugs appropriate? Yes . Are the premedications appropriate for the patient's regimen? Yes . Prior Authorization for treatment is: Approved o If applicable, is the correct biosimilar selected based on the patient's insurance? not applicable  Organ Function and Labs: Marland Kitchen Are dose adjustments needed based on the patient's renal function, hepatic function, or hematologic function? No . Are appropriate labs ordered prior to the start of patient's treatment? Yes . Other organ system assessment, if indicated: N/A . The following baseline labs, if indicated, have been ordered: daratumumab: blood type and screen  Dose Assessment: . Are the drug doses appropriate? Yes . Are the following correct: o Drug concentrations Yes o IV fluid compatible with drug Yes o Administration routes Yes o Timing of therapy Yes . If applicable, does the patient have documented access for treatment and/or plans for port-a-cath placement? not applicable . If applicable, have lifetime cumulative doses been properly documented and assessed? not applicable Lifetime Dose Tracking  No doses have been documented on this patient for the following tracked chemicals: Doxorubicin, Epirubicin, Idarubicin, Daunorubicin, Mitoxantrone, Bleomycin, Oxaliplatin, Carboplatin, Liposomal Doxorubicin  o   Toxicity Monitoring/Prevention: . The patient has the following take home antiemetics prescribed: Dexamethasone . The patient has the following take home medications prescribed: N/A . Medication allergies and previous infusion related reactions, if applicable, have been reviewed and addressed. No . The patient's current medication list has been assessed for drug-drug  interactions with their chemotherapy regimen. no significant drug-drug interactions were identified on review.  Order Review: . Are the treatment plan orders signed? No . Is the patient scheduled to see a provider prior to their treatment? Yes  I verify that I have reviewed each item in the above checklist and answered each question accordingly.  Sarah [name] K 09/23/2019 11:53 AM

## 2019-09-23 NOTE — Patient Instructions (Addendum)
  Increase calcium to 1200 mg a day with vitamin D 800 IU.   Take singulair 10 mg by mouth the day before and for two days after treatment.  We will give you singulair on the day of treatment.  Take Decadron 20 mg (five 4 mg tablets) by mouth on days 2, 9, 16, and 23.  Take acyclovir 400 mg by mouth every 12 hours.  Take aspirin 81 mg by mouth every day.   We will give you singulair and Decadron as well as other premedications on your day of treatment.

## 2019-09-24 ENCOUNTER — Ambulatory Visit: Payer: Medicare PPO

## 2019-09-24 ENCOUNTER — Inpatient Hospital Stay: Payer: Medicare PPO

## 2019-09-24 ENCOUNTER — Other Ambulatory Visit: Payer: Medicare PPO

## 2019-09-24 DIAGNOSIS — R5383 Other fatigue: Secondary | ICD-10-CM | POA: Diagnosis not present

## 2019-09-24 DIAGNOSIS — R0981 Nasal congestion: Secondary | ICD-10-CM | POA: Diagnosis not present

## 2019-09-24 DIAGNOSIS — R110 Nausea: Secondary | ICD-10-CM | POA: Diagnosis not present

## 2019-09-24 DIAGNOSIS — R05 Cough: Secondary | ICD-10-CM | POA: Diagnosis not present

## 2019-09-24 DIAGNOSIS — M791 Myalgia, unspecified site: Secondary | ICD-10-CM | POA: Diagnosis not present

## 2019-09-24 DIAGNOSIS — C9000 Multiple myeloma not having achieved remission: Secondary | ICD-10-CM

## 2019-09-24 DIAGNOSIS — M898X9 Other specified disorders of bone, unspecified site: Secondary | ICD-10-CM | POA: Diagnosis not present

## 2019-09-24 DIAGNOSIS — M549 Dorsalgia, unspecified: Secondary | ICD-10-CM | POA: Diagnosis not present

## 2019-09-24 DIAGNOSIS — M542 Cervicalgia: Secondary | ICD-10-CM | POA: Diagnosis not present

## 2019-09-24 LAB — HEMOGLOBIN A1C
Hgb A1c MFr Bld: 6.1 % — ABNORMAL HIGH (ref 4.8–5.6)
Mean Plasma Glucose: 128.37 mg/dL

## 2019-09-24 LAB — PRETREATMENT RBC PHENOTYPE

## 2019-09-24 LAB — KAPPA/LAMBDA LIGHT CHAINS
Kappa free light chain: 11.5 mg/L (ref 3.3–19.4)
Kappa, lambda light chain ratio: 0.02 — ABNORMAL LOW (ref 0.26–1.65)
Lambda free light chains: 686.4 mg/L — ABNORMAL HIGH (ref 5.7–26.3)

## 2019-09-24 LAB — MAGNESIUM: Magnesium: 2.2 mg/dL (ref 1.7–2.4)

## 2019-09-24 MED ORDER — SODIUM CHLORIDE 0.9% FLUSH
10.0000 mL | INTRAVENOUS | Status: DC | PRN
  Administered 2019-09-24: 10 mL via INTRAVENOUS
  Filled 2019-09-24: qty 10

## 2019-09-24 MED ORDER — HEPARIN SOD (PORK) LOCK FLUSH 100 UNIT/ML IV SOLN
500.0000 [IU] | Freq: Once | INTRAVENOUS | Status: AC
  Administered 2019-09-24: 500 [IU] via INTRAVENOUS
  Filled 2019-09-24: qty 5

## 2019-09-24 NOTE — Progress Notes (Signed)
The recheck on patient's magnesium level was 2.2 today.  Patient was given a print out of calendars for the next several months to help her understand how and when to take her Decadron prescription.  Patient was educated and repeated back the directions.  MD informed.

## 2019-09-27 ENCOUNTER — Other Ambulatory Visit: Payer: Self-pay

## 2019-09-27 ENCOUNTER — Inpatient Hospital Stay: Payer: Medicare PPO

## 2019-09-27 ENCOUNTER — Inpatient Hospital Stay: Payer: Medicare PPO | Attending: Hematology and Oncology

## 2019-09-27 ENCOUNTER — Other Ambulatory Visit: Payer: Self-pay | Admitting: *Deleted

## 2019-09-27 ENCOUNTER — Other Ambulatory Visit: Payer: Self-pay | Admitting: Hematology and Oncology

## 2019-09-27 VITALS — BP 109/54 | HR 72 | Temp 97.0°F | Resp 18 | Wt 153.2 lb

## 2019-09-27 DIAGNOSIS — Z803 Family history of malignant neoplasm of breast: Secondary | ICD-10-CM | POA: Diagnosis not present

## 2019-09-27 DIAGNOSIS — M791 Myalgia, unspecified site: Secondary | ICD-10-CM | POA: Diagnosis not present

## 2019-09-27 DIAGNOSIS — E119 Type 2 diabetes mellitus without complications: Secondary | ICD-10-CM | POA: Insufficient documentation

## 2019-09-27 DIAGNOSIS — C9000 Multiple myeloma not having achieved remission: Secondary | ICD-10-CM

## 2019-09-27 DIAGNOSIS — D696 Thrombocytopenia, unspecified: Secondary | ICD-10-CM

## 2019-09-27 DIAGNOSIS — R197 Diarrhea, unspecified: Secondary | ICD-10-CM | POA: Insufficient documentation

## 2019-09-27 DIAGNOSIS — Z841 Family history of disorders of kidney and ureter: Secondary | ICD-10-CM | POA: Diagnosis not present

## 2019-09-27 DIAGNOSIS — R110 Nausea: Secondary | ICD-10-CM | POA: Diagnosis not present

## 2019-09-27 DIAGNOSIS — N183 Chronic kidney disease, stage 3 unspecified: Secondary | ICD-10-CM | POA: Diagnosis not present

## 2019-09-27 DIAGNOSIS — Z800 Family history of malignant neoplasm of digestive organs: Secondary | ICD-10-CM | POA: Insufficient documentation

## 2019-09-27 DIAGNOSIS — R61 Generalized hyperhidrosis: Secondary | ICD-10-CM | POA: Diagnosis not present

## 2019-09-27 DIAGNOSIS — M549 Dorsalgia, unspecified: Secondary | ICD-10-CM | POA: Diagnosis not present

## 2019-09-27 DIAGNOSIS — Z833 Family history of diabetes mellitus: Secondary | ICD-10-CM | POA: Diagnosis not present

## 2019-09-27 DIAGNOSIS — Z9221 Personal history of antineoplastic chemotherapy: Secondary | ICD-10-CM | POA: Diagnosis not present

## 2019-09-27 DIAGNOSIS — Z832 Family history of diseases of the blood and blood-forming organs and certain disorders involving the immune mechanism: Secondary | ICD-10-CM | POA: Insufficient documentation

## 2019-09-27 DIAGNOSIS — G629 Polyneuropathy, unspecified: Secondary | ICD-10-CM | POA: Insufficient documentation

## 2019-09-27 DIAGNOSIS — Z5112 Encounter for antineoplastic immunotherapy: Secondary | ICD-10-CM | POA: Insufficient documentation

## 2019-09-27 DIAGNOSIS — D649 Anemia, unspecified: Secondary | ICD-10-CM

## 2019-09-27 DIAGNOSIS — E538 Deficiency of other specified B group vitamins: Secondary | ICD-10-CM | POA: Diagnosis not present

## 2019-09-27 DIAGNOSIS — M1990 Unspecified osteoarthritis, unspecified site: Secondary | ICD-10-CM | POA: Diagnosis not present

## 2019-09-27 DIAGNOSIS — Z808 Family history of malignant neoplasm of other organs or systems: Secondary | ICD-10-CM | POA: Diagnosis not present

## 2019-09-27 DIAGNOSIS — Z8249 Family history of ischemic heart disease and other diseases of the circulatory system: Secondary | ICD-10-CM | POA: Insufficient documentation

## 2019-09-27 DIAGNOSIS — Z79899 Other long term (current) drug therapy: Secondary | ICD-10-CM | POA: Insufficient documentation

## 2019-09-27 DIAGNOSIS — M879 Osteonecrosis, unspecified: Secondary | ICD-10-CM | POA: Diagnosis not present

## 2019-09-27 DIAGNOSIS — G8929 Other chronic pain: Secondary | ICD-10-CM | POA: Insufficient documentation

## 2019-09-27 DIAGNOSIS — Z822 Family history of deafness and hearing loss: Secondary | ICD-10-CM | POA: Insufficient documentation

## 2019-09-27 DIAGNOSIS — Z801 Family history of malignant neoplasm of trachea, bronchus and lung: Secondary | ICD-10-CM | POA: Insufficient documentation

## 2019-09-27 DIAGNOSIS — Z7189 Other specified counseling: Secondary | ICD-10-CM

## 2019-09-27 LAB — CBC WITH DIFFERENTIAL/PLATELET
Abs Immature Granulocytes: 0.01 10*3/uL (ref 0.00–0.07)
Basophils Absolute: 0 10*3/uL (ref 0.0–0.1)
Basophils Relative: 0 %
Eosinophils Absolute: 0.2 10*3/uL (ref 0.0–0.5)
Eosinophils Relative: 3 %
HCT: 30 % — ABNORMAL LOW (ref 36.0–46.0)
Hemoglobin: 10.5 g/dL — ABNORMAL LOW (ref 12.0–15.0)
Immature Granulocytes: 0 %
Lymphocytes Relative: 19 %
Lymphs Abs: 0.9 10*3/uL (ref 0.7–4.0)
MCH: 30.9 pg (ref 26.0–34.0)
MCHC: 35 g/dL (ref 30.0–36.0)
MCV: 88.2 fL (ref 80.0–100.0)
Monocytes Absolute: 0.4 10*3/uL (ref 0.1–1.0)
Monocytes Relative: 9 %
Neutro Abs: 3.2 10*3/uL (ref 1.7–7.7)
Neutrophils Relative %: 69 %
Platelets: 98 10*3/uL — ABNORMAL LOW (ref 150–400)
RBC: 3.4 MIL/uL — ABNORMAL LOW (ref 3.87–5.11)
RDW: 13.5 % (ref 11.5–15.5)
WBC: 4.6 10*3/uL (ref 4.0–10.5)
nRBC: 0 % (ref 0.0–0.2)

## 2019-09-27 LAB — TYPE AND SCREEN
ABO/RH(D): O POS
Antibody Screen: NEGATIVE

## 2019-09-27 LAB — MAGNESIUM: Magnesium: 1.2 mg/dL — ABNORMAL LOW (ref 1.7–2.4)

## 2019-09-27 LAB — COMPREHENSIVE METABOLIC PANEL
ALT: 19 U/L (ref 0–44)
AST: 26 U/L (ref 15–41)
Albumin: 4 g/dL (ref 3.5–5.0)
Alkaline Phosphatase: 75 U/L (ref 38–126)
Anion gap: 8 (ref 5–15)
BUN: 29 mg/dL — ABNORMAL HIGH (ref 8–23)
CO2: 22 mmol/L (ref 22–32)
Calcium: 8.9 mg/dL (ref 8.9–10.3)
Chloride: 107 mmol/L (ref 98–111)
Creatinine, Ser: 1.21 mg/dL — ABNORMAL HIGH (ref 0.44–1.00)
GFR calc Af Amer: 56 mL/min — ABNORMAL LOW (ref >60)
GFR calc non Af Amer: 48 mL/min — ABNORMAL LOW (ref >60)
Glucose, Bld: 145 mg/dL — ABNORMAL HIGH (ref 70–99)
Potassium: 4.5 mmol/L (ref 3.5–5.1)
Sodium: 137 mmol/L (ref 135–145)
Total Bilirubin: 0.3 mg/dL (ref 0.3–1.2)
Total Protein: 6.5 g/dL (ref 6.5–8.1)

## 2019-09-27 LAB — PLATELET BY CITRATE: Platelet CT in Citrate: 101

## 2019-09-27 MED ORDER — DEXAMETHASONE 4 MG PO TABS: ORAL_TABLET | ORAL | 0 refills | Status: DC

## 2019-09-27 MED ORDER — DIPHENHYDRAMINE HCL 25 MG PO CAPS
50.0000 mg | ORAL_CAPSULE | Freq: Once | ORAL | Status: AC
  Administered 2019-09-27: 50 mg via ORAL
  Filled 2019-09-27: qty 2

## 2019-09-27 MED ORDER — ACETAMINOPHEN 325 MG PO TABS
650.0000 mg | ORAL_TABLET | Freq: Once | ORAL | Status: AC
  Administered 2019-09-27: 650 mg via ORAL
  Filled 2019-09-27: qty 2

## 2019-09-27 MED ORDER — SODIUM CHLORIDE 0.9 % IV SOLN
Freq: Once | INTRAVENOUS | Status: AC
  Filled 2019-09-27: qty 250

## 2019-09-27 MED ORDER — DARATUMUMAB-HYALURONIDASE-FIHJ 1800-30000 MG-UT/15ML ~~LOC~~ SOLN
1800.0000 mg | Freq: Once | SUBCUTANEOUS | Status: AC
  Administered 2019-09-27: 1800 mg via SUBCUTANEOUS
  Filled 2019-09-27: qty 15

## 2019-09-27 MED ORDER — MAGNESIUM SULFATE 4 GM/100ML IV SOLN
4.0000 g | Freq: Once | INTRAVENOUS | Status: AC
  Administered 2019-09-27: 4 g via INTRAVENOUS
  Filled 2019-09-27: qty 100

## 2019-09-27 MED ORDER — DEXAMETHASONE 4 MG PO TABS
20.0000 mg | ORAL_TABLET | Freq: Once | ORAL | Status: AC
  Administered 2019-09-27: 20 mg via ORAL
  Filled 2019-09-27: qty 5

## 2019-09-27 MED ORDER — HEPARIN SOD (PORK) LOCK FLUSH 100 UNIT/ML IV SOLN
500.0000 [IU] | Freq: Once | INTRAVENOUS | Status: AC | PRN
  Administered 2019-09-27: 500 [IU]
  Filled 2019-09-27: qty 5

## 2019-09-27 NOTE — Progress Notes (Signed)
Proceed with tx per MD. Lab reviewed.  Patient took singulair at home.

## 2019-09-27 NOTE — Progress Notes (Signed)
Pharmacist Chemotherapy Monitoring - Follow Up Assessment    I verify that I have reviewed each item in the below checklist:  . Regimen for the patient is scheduled for the appropriate day and plan matches scheduled date. Marland Kitchen Appropriate non-routine labs are ordered dependent on drug ordered. . If applicable, additional medications reviewed and ordered per protocol based on lifetime cumulative doses and/or treatment regimen.   Plan for follow-up and/or issues identified: No . I-vent associated with next due treatment: No . MD and/or nursing notified: No  Sarah [name] K 09/27/2019 1:17 PM

## 2019-09-27 NOTE — Progress Notes (Signed)
Error

## 2019-09-27 NOTE — Progress Notes (Signed)
Platelets 98 and Magnesium 1.2, Per Dr. Mike Gip draw additional Platelet count and pt to receive 4 grams Magnesium IV.  Platelet recheck was 101. Per Dr. Mike Gip okay to proceed with treatment at this time. Pt reports that she took Singulair at home prior to reporting clinic.   1500: pt tolerated treatment well. Pt monitored two hours post injection per order. No s/s of distress noted. Injection site WNL, Pt and VS stable at discharge.

## 2019-09-29 ENCOUNTER — Other Ambulatory Visit: Payer: Self-pay | Admitting: Internal Medicine

## 2019-09-29 DIAGNOSIS — I6523 Occlusion and stenosis of bilateral carotid arteries: Secondary | ICD-10-CM

## 2019-09-29 DIAGNOSIS — F5101 Primary insomnia: Secondary | ICD-10-CM

## 2019-09-29 DIAGNOSIS — F324 Major depressive disorder, single episode, in partial remission: Secondary | ICD-10-CM

## 2019-09-30 ENCOUNTER — Other Ambulatory Visit: Payer: Self-pay

## 2019-09-30 ENCOUNTER — Inpatient Hospital Stay: Payer: Medicare PPO

## 2019-09-30 ENCOUNTER — Inpatient Hospital Stay: Payer: Medicare PPO | Attending: Hematology and Oncology

## 2019-09-30 DIAGNOSIS — C9000 Multiple myeloma not having achieved remission: Secondary | ICD-10-CM | POA: Insufficient documentation

## 2019-09-30 DIAGNOSIS — M791 Myalgia, unspecified site: Secondary | ICD-10-CM | POA: Insufficient documentation

## 2019-09-30 DIAGNOSIS — Z800 Family history of malignant neoplasm of digestive organs: Secondary | ICD-10-CM | POA: Insufficient documentation

## 2019-09-30 DIAGNOSIS — Z9484 Stem cells transplant status: Secondary | ICD-10-CM | POA: Insufficient documentation

## 2019-09-30 DIAGNOSIS — I130 Hypertensive heart and chronic kidney disease with heart failure and stage 1 through stage 4 chronic kidney disease, or unspecified chronic kidney disease: Secondary | ICD-10-CM | POA: Diagnosis not present

## 2019-09-30 DIAGNOSIS — Z885 Allergy status to narcotic agent status: Secondary | ICD-10-CM | POA: Insufficient documentation

## 2019-09-30 DIAGNOSIS — E538 Deficiency of other specified B group vitamins: Secondary | ICD-10-CM | POA: Diagnosis not present

## 2019-09-30 DIAGNOSIS — I509 Heart failure, unspecified: Secondary | ICD-10-CM | POA: Diagnosis not present

## 2019-09-30 DIAGNOSIS — G8929 Other chronic pain: Secondary | ICD-10-CM | POA: Insufficient documentation

## 2019-09-30 DIAGNOSIS — D649 Anemia, unspecified: Secondary | ICD-10-CM | POA: Diagnosis not present

## 2019-09-30 DIAGNOSIS — D709 Neutropenia, unspecified: Secondary | ICD-10-CM | POA: Diagnosis not present

## 2019-09-30 DIAGNOSIS — K5900 Constipation, unspecified: Secondary | ICD-10-CM | POA: Insufficient documentation

## 2019-09-30 DIAGNOSIS — G629 Polyneuropathy, unspecified: Secondary | ICD-10-CM | POA: Diagnosis not present

## 2019-09-30 DIAGNOSIS — Z833 Family history of diabetes mellitus: Secondary | ICD-10-CM | POA: Insufficient documentation

## 2019-09-30 DIAGNOSIS — Z8249 Family history of ischemic heart disease and other diseases of the circulatory system: Secondary | ICD-10-CM | POA: Insufficient documentation

## 2019-09-30 DIAGNOSIS — Z821 Family history of blindness and visual loss: Secondary | ICD-10-CM | POA: Insufficient documentation

## 2019-09-30 DIAGNOSIS — Z5112 Encounter for antineoplastic immunotherapy: Secondary | ICD-10-CM | POA: Insufficient documentation

## 2019-09-30 DIAGNOSIS — Z808 Family history of malignant neoplasm of other organs or systems: Secondary | ICD-10-CM | POA: Insufficient documentation

## 2019-09-30 DIAGNOSIS — Z7952 Long term (current) use of systemic steroids: Secondary | ICD-10-CM | POA: Insufficient documentation

## 2019-09-30 DIAGNOSIS — R110 Nausea: Secondary | ICD-10-CM | POA: Insufficient documentation

## 2019-09-30 DIAGNOSIS — R0982 Postnasal drip: Secondary | ICD-10-CM | POA: Insufficient documentation

## 2019-09-30 DIAGNOSIS — Z888 Allergy status to other drugs, medicaments and biological substances status: Secondary | ICD-10-CM | POA: Insufficient documentation

## 2019-09-30 DIAGNOSIS — F329 Major depressive disorder, single episode, unspecified: Secondary | ICD-10-CM | POA: Insufficient documentation

## 2019-09-30 DIAGNOSIS — E860 Dehydration: Secondary | ICD-10-CM | POA: Insufficient documentation

## 2019-09-30 DIAGNOSIS — Z803 Family history of malignant neoplasm of breast: Secondary | ICD-10-CM | POA: Insufficient documentation

## 2019-09-30 DIAGNOSIS — E1122 Type 2 diabetes mellitus with diabetic chronic kidney disease: Secondary | ICD-10-CM | POA: Insufficient documentation

## 2019-09-30 DIAGNOSIS — E8889 Other specified metabolic disorders: Secondary | ICD-10-CM | POA: Insufficient documentation

## 2019-09-30 DIAGNOSIS — Z87891 Personal history of nicotine dependence: Secondary | ICD-10-CM | POA: Insufficient documentation

## 2019-09-30 DIAGNOSIS — R197 Diarrhea, unspecified: Secondary | ICD-10-CM | POA: Insufficient documentation

## 2019-09-30 DIAGNOSIS — J029 Acute pharyngitis, unspecified: Secondary | ICD-10-CM | POA: Insufficient documentation

## 2019-09-30 DIAGNOSIS — Z8379 Family history of other diseases of the digestive system: Secondary | ICD-10-CM | POA: Insufficient documentation

## 2019-09-30 DIAGNOSIS — Z9481 Bone marrow transplant status: Secondary | ICD-10-CM | POA: Insufficient documentation

## 2019-09-30 DIAGNOSIS — M549 Dorsalgia, unspecified: Secondary | ICD-10-CM | POA: Insufficient documentation

## 2019-09-30 DIAGNOSIS — Z841 Family history of disorders of kidney and ureter: Secondary | ICD-10-CM | POA: Insufficient documentation

## 2019-09-30 DIAGNOSIS — R143 Flatulence: Secondary | ICD-10-CM | POA: Insufficient documentation

## 2019-09-30 DIAGNOSIS — Z79899 Other long term (current) drug therapy: Secondary | ICD-10-CM | POA: Insufficient documentation

## 2019-09-30 DIAGNOSIS — Z832 Family history of diseases of the blood and blood-forming organs and certain disorders involving the immune mechanism: Secondary | ICD-10-CM | POA: Insufficient documentation

## 2019-09-30 DIAGNOSIS — R61 Generalized hyperhidrosis: Secondary | ICD-10-CM | POA: Insufficient documentation

## 2019-09-30 DIAGNOSIS — R21 Rash and other nonspecific skin eruption: Secondary | ICD-10-CM | POA: Insufficient documentation

## 2019-09-30 DIAGNOSIS — F419 Anxiety disorder, unspecified: Secondary | ICD-10-CM | POA: Insufficient documentation

## 2019-09-30 DIAGNOSIS — R5081 Fever presenting with conditions classified elsewhere: Secondary | ICD-10-CM | POA: Diagnosis not present

## 2019-09-30 DIAGNOSIS — Z801 Family history of malignant neoplasm of trachea, bronchus and lung: Secondary | ICD-10-CM | POA: Insufficient documentation

## 2019-09-30 DIAGNOSIS — M1990 Unspecified osteoarthritis, unspecified site: Secondary | ICD-10-CM | POA: Insufficient documentation

## 2019-09-30 DIAGNOSIS — N183 Chronic kidney disease, stage 3 unspecified: Secondary | ICD-10-CM | POA: Insufficient documentation

## 2019-09-30 DIAGNOSIS — R05 Cough: Secondary | ICD-10-CM | POA: Insufficient documentation

## 2019-09-30 DIAGNOSIS — M879 Osteonecrosis, unspecified: Secondary | ICD-10-CM | POA: Diagnosis not present

## 2019-09-30 DIAGNOSIS — Z886 Allergy status to analgesic agent status: Secondary | ICD-10-CM | POA: Insufficient documentation

## 2019-09-30 LAB — MAGNESIUM: Magnesium: 1.4 mg/dL — ABNORMAL LOW (ref 1.7–2.4)

## 2019-09-30 MED ORDER — SODIUM CHLORIDE 0.9% FLUSH
10.0000 mL | INTRAVENOUS | Status: DC | PRN
  Administered 2019-09-30: 10 mL via INTRAVENOUS
  Filled 2019-09-30: qty 10

## 2019-09-30 MED ORDER — SODIUM CHLORIDE 0.9 % IV SOLN
Freq: Once | INTRAVENOUS | Status: AC
  Filled 2019-09-30: qty 250

## 2019-09-30 MED ORDER — MAGNESIUM SULFATE 4 GM/100ML IV SOLN
4.0000 g | Freq: Once | INTRAVENOUS | Status: AC
  Administered 2019-09-30: 4 g via INTRAVENOUS

## 2019-09-30 MED ORDER — HEPARIN SOD (PORK) LOCK FLUSH 100 UNIT/ML IV SOLN
500.0000 [IU] | Freq: Once | INTRAVENOUS | Status: AC
  Administered 2019-09-30: 500 [IU] via INTRAVENOUS
  Filled 2019-09-30: qty 5

## 2019-09-30 MED ORDER — CYANOCOBALAMIN 1000 MCG/ML IJ SOLN
1000.0000 ug | Freq: Once | INTRAMUSCULAR | Status: AC
  Administered 2019-09-30: 1000 ug via INTRAMUSCULAR

## 2019-09-30 NOTE — Progress Notes (Signed)
[facility]  [address] [place], [place] [address] Phone: [phone]  Fax: [phone]   Clinic Day:  10/04/2019  Referring physician: Glean Hess, MD  Chief Complaint: Sarah [name] is a 64 y.o. 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   HPI: The patient was last seen in the medical oncology clinic on 09/23/2019. At that time, she noted diarrhea s/p vaccine. She denied any increase in nausea. She had chronic fatigue. She was on oral B12.  Hematocrit 30.3, hemoglobin 10.4, platelets 121,000, WBC 6,200. Creatinine was 1.27.  Lambda free light chains were  686.4 (ratio 0.02).   Stool studies were negative.  Calcium was 8.3.  Magnesium was 1.0.  She was to begin calcium 1200 mg and vitamin D 800 IU per day. We discussed receiving day 1 of cycle #1 daratumumab and hyaluronidase-fihj in [place] on 09/27/2019.  She received 6 gm IV magnesium sulfate.   Pretreatment RBC phenotype on 09/23/2019 was positive for C, e, DUFFY B, KIDD B, M, S, and s antigen. Negative for c, E, KELL, DUFFY A, KIDD A, and N antigen.   Patient had a follow up with Dr. Holley Raring on 09/23/2019. Patient continued to struggle with hypomagnesemia. Her renal function was slightly lower than before with an EGFR of 45 ml/min.  He noted that low renal function could potentially be attributable to her underlying multiple myeloma.  She will follow up on 01/23/2020.   Patient received day 1 cycle #1 daratumumab and hyaluronidase-fihj in [place] on 09/27/2019.  She tolerated her injection well.  Hematocrit 30.0, hemoglobin 10.5, platelets 98,000, WBC 4,600. Platelets count in citrate tube was 101,000. Creatinine 1.21.  Magnesium was 1.2.  She received 4 gm IV magnesium.   Patient received 4 gm magnesium on 09/30/2019 for a  magnesium of 1.4.  She received B12 on 09/30/2019.  During the interim, the patient was doing "alright". She feels tired. She noted some minimal congestion 2 days after her injection last week. She had no irritation or rash at the site of injection. She denies any shortness or breath. She has no sputum production. She denies any fever. She has some post nasal drip. She continues to have night sweats.   She no longer had diarrhea after the injection last week. She notes being constipated for 1 week then her bowel were normal for a couple of days. She now has diarrhea again and is on Lomotil. Her nausea has intensified and she would like a stronger prescription. Her back, joint, and muscle pain are unchanged.   She has a sharp burning numbness and tingling pain in her toes. She has a poor grip and reports dropping things. Her blood sugar is not being monitored at home because she is waiting on a new device after misplacing her previous device.   Today she took Singulair, Tylenol and liquid Benadryl for a better effect before her clinic visit today. Her sister is driving her today.   Patient is set to have her second COVID-19 vaccine on 10/11/2019.    Past Medical History:  Diagnosis Date  . Abnormal stress test 02/14/2016   Overview:  Added automatically from request for surgery [id number]  . Anemia   . Anxiety   . Arthritis   . Bicuspid aortic valve   . CHF (congestive heart failure) ([place])   .  CKD (chronic kidney disease) stage 3, GFR 30-59 ml/min   . Depression   . Diabetes mellitus ([place])   . Dizziness   . Fatty liver   . Frequent falls   . GERD (gastroesophageal reflux disease)   . Gout   . Heart murmur   . History of blood transfusion   . History of bone marrow transplant ([place])   . History of uterine fibroid   . Hx of cardiac catheterization 06/05/2016   Overview:  Normal coronaries 2017  . Hypertension   . Hypomagnesemia   . Multiple myeloma ([place])   . Personal history of  chemotherapy   . Renal cyst     Past Surgical History:  Procedure Laterality Date  . ABDOMINAL HYSTERECTOMY    . 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 CARDIAC ELECTROPHYSIOLOGY MAPPING AND ABLATION    . CARPAL TUNNEL RELEASE Bilateral   . CHOLECYSTECTOMY  2008  . COLONOSCOPY WITH PROPOFOL N/A 05/07/2017   Procedure: COLONOSCOPY WITH PROPOFOL;  Surgeon: [name], MD;  Location: [facility] ENDOSCOPY;  Service: Gastroenterology;  Laterality: N/A;  . ESOPHAGOGASTRODUODENOSCOPY (EGD) WITH PROPOFOL N/A 05/07/2017   Procedure: ESOPHAGOGASTRODUODENOSCOPY (EGD) WITH PROPOFOL;  Surgeon: [name], MD;  Location: [facility] ENDOSCOPY;  Service: Gastroenterology;  Laterality: N/A;  . FOOT SURGERY Bilateral   . INCONTINENCE SURGERY  2009  . INTERSTIM IMPLANT PLACEMENT    . other     over active bladder  . OTHER SURGICAL HISTORY     bladder stimulator   . PARTIAL HYSTERECTOMY  03/1996   fibroids  . PORTA CATH INSERTION N/A 03/10/2019   Procedure: PORTA CATH INSERTION;  Surgeon: Algernon Huxley, MD;  Location: [place] CV LAB;  Service: Cardiovascular;  Laterality: N/A;  . TONSILLECTOMY  2007    Family History  Problem Relation Age of Onset  . Colon cancer Father   . Renal Disease Father   . Diabetes Mellitus II Father   . Melanoma Paternal Grandmother   . Breast cancer Maternal Aunt 3  . Anemia Mother   . Heart disease Mother   . Heart failure Mother   . Renal Disease Mother   . Congestive Heart Failure Mother   . Heart disease Maternal Uncle   . Throat cancer Maternal Uncle   . Lung cancer Maternal Uncle   . Liver disease Maternal Uncle   . Heart failure Maternal Uncle   . Hearing loss Son 44       Suicide     Social History:  reports that she quit smoking about 28 years ago. Her smoking use included cigarettes. She has a 20.00 pack-year smoking history. She has never used smokeless tobacco. She reports previous alcohol use. She reports that she does not use drugs. Patient has not had  ETOH in several months. She is on disability. She notes exposure to perchloroethylene [facility]).She lives much of her adult life in [place]. She was married with 2 sons. Her husband passed away. Her 2 sons took their own lives (age 60 and 12). She worked in [employer] in [employer]. She went back to school and earned an [employer] Iv and works for [employer] for several years.She [place] sistersin [place]. The patient is alone today.  Allergies:  Allergies  Allergen Reactions  . Oxycodone-Acetaminophen Anaphylaxis    Swelling and rash  . Celebrex [Celecoxib] Diarrhea  . Codeine   . Benadryl [Diphenhydramine] Palpitations  . Morphine Itching and Rash  . Ondansetron Diarrhea  . Tylenol [Acetaminophen] Itching  and Rash    Current Medications: Current Outpatient Medications  Medication Sig Dispense Refill  . acyclovir (ZOVIRAX) 400 MG tablet Take 1 tablet (400 mg total) by mouth 2 (two) times daily. 60 tablet 11  . allopurinol (ZYLOPRIM) 100 MG tablet TAKE 1 TABLET(100 MG) BY MOUTH DAILY as needed 90 tablet 1  . aspirin 81 MG chewable tablet Chew 1 tablet (81 mg total) by mouth daily. 30 tablet 0  . bisoprolol (ZEBETA) 10 MG tablet TAKE 1 TABLET(10 MG) BY MOUTH DAILY 90 tablet 0  . dexamethasone (DECADRON) 4 MG tablet Take 5 tablets (24m) on days 2, 9, 16, 23 of cycle 1 and 2 Take 5 tablets (215m on days 2, 16 of cycle 3,4,5 and 6 Take 5 tablets (2060mon days 2 of cycles 7 and beyond 120 tablet 0  . diclofenac sodium (VOLTAREN) 1 % GEL Apply 2 g topically 4 (four) times daily. 100 g 1  . fentaNYL (DURAGESIC - DOSED MCG/HR) 25 MCG/HR patch Place 25 mcg onto the skin every 3 (three) days.     . fexofenadine (ALLEGRA) 180 MG tablet TAKE 1 TABLET(180 MG) BY MOUTH DAILY 30 tablet 5  . FLUoxetine (PROZAC) 40 MG capsule TAKE 1 CAPSULE EVERY DAY 90 capsule 1  . fluticasone (FLONASE) 50 MCG/ACT nasal spray Place 2 sprays into both nostrils daily. 16 g 2  . furosemide  (LASIX) 20 MG tablet Take 20 mg by mouth as needed.     . gMarland Kitchenucose blood (ONE TOUCH ULTRA TEST) test strip     . hydrOXYzine (ATARAX/VISTARIL) 10 MG tablet TAKE 1 TABLET(10 MG) BY MOUTH THREE TIMES DAILY AS NEEDED 90 tablet 0  . lidocaine-prilocaine (EMLA) cream APPLY TOPICALLY AS NEEDED. 30 g 0  . lisinopril (PRINIVIL,ZESTRIL) 10 MG tablet Take 10 mg by mouth daily.    . Magnesium Bisglycinate (MAG GLYCINATE) 100 MG TABS Take 100 mg by mouth 2 (two) times daily. 60 tablet 0  . metFORMIN (GLUCOPHAGE-XR) 500 MG 24 hr tablet Take 1 tablet (500 mg total) by mouth daily with breakfast. 90 tablet 1  . Misc Natural Products (OSTEO BI-FLEX ADV TRIPLE ST PO) Take 2 tablets by mouth daily.    . montelukast (SINGULAIR) 10 MG tablet Take 1 tablet by mouth on the day before treatment and for 2 days after treatment. 30 tablet 0  . Multiple Vitamins-Minerals (HAIR/SKIN/NAILS/BIOTIN PO) Take 1 capsule by mouth 3 (three) times daily.    . NON FORMULARY     . Omega 3-6-9 Fatty Acids (OMEGA 3-6-9 COMPLEX) CAPS Take by mouth.    . oMarland Kitcheneprazole (PRILOSEC) 40 MG capsule Take 1 capsule (40 mg total) by mouth daily. 90 capsule 1  . pomalidomide (POMALYST) 4 MG capsule Take 1 capsule (4 mg total) by mouth daily. Take for 21 days, then hold for 7 days. Repeat every 28 days. 21 capsule 0  . pravastatin (PRAVACHOL) 20 MG tablet TAKE 1 TABLET EVERY DAY 90 tablet 1  . promethazine (PHENERGAN) 25 MG tablet Take 1 tablet (25 mg total) by mouth 2 (two) times daily as needed for nausea or vomiting. 60 tablet 0  . tiZANidine (ZANAFLEX) 4 MG tablet Take 1 tablet (4 mg total) by mouth every 8 (eight) hours as needed for muscle spasms. 270 tablet 1  . traZODone (DESYREL) 100 MG tablet TAKE 1 TABLET AT BEDTIME  FOR  SLEEP 90 tablet 1  . diphenoxylate-atropine (LOMOTIL) 2.5-0.025 MG tablet Take 2 tablets by mouth daily as needed for diarrhea or loose  stools. (Patient not taking: Reported on 10/01/2019) 45 tablet 2  . fluconazole (DIFLUCAN)  150 MG tablet     . NARCAN 4 MG/0.1ML LIQD nasal spray kit 1 spray.    . promethazine-dextromethorphan (PROMETHAZINE-DM) 6.25-15 MG/5ML syrup Take 5 mLs by mouth 4 (four) times daily as needed for cough. (Patient not taking: Reported on 09/22/2019) 240 mL 0   No current facility-administered medications for this visit.   Facility-Administered Medications Ordered in Other Visits  Medication Dose Route Frequency Provider Last Rate Last Admin  . heparin lock flush 100 unit/mL  500 Units Intravenous Once Tersa Fotopoulos C, MD      . sodium chloride flush (NS) 0.9 % injection 10 mL  10 mL Intravenous PRN Nolon Stalls C, MD   10 mL at 10/04/19 0851    Review of Systems  Constitutional: Positive for diaphoresis (night sweats), malaise/fatigue (majority of the time) and weight loss (2 lbs). Negative for chills and fever.       Doing "ok".  HENT: Negative for congestion, ear pain, hearing loss, nosebleeds, sinus pain and sore throat.        Post nasal drip.  Eyes: Negative.  Negative for blurred vision and double vision.       No glaucoma.  Respiratory: Negative.  Negative for cough, sputum production, shortness of breath and wheezing.   Cardiovascular: Negative.  Negative for chest pain, palpitations, orthopnea, leg swelling and PND.  Gastrointestinal: Positive for nausea (chronic s/p gallbladder surgery). Negative for abdominal pain, blood in stool, constipation, diarrhea, heartburn (controlled on medication), melena and vomiting.       Sensitive stomach. Bowels fluctuate (diarrhea, constipation and normal).  Genitourinary: Negative for dysuria, frequency, hematuria and urgency.       Bladder leakage (f/u urology bladder sling).  Bladder spasms.  Musculoskeletal: Positive for back pain (chronic, spinal stenosis and pinched nerve; lower back (5/10) s/p interstim), joint pain (arthritis) and myalgias (leg cramps, worse at night; thighs ache less).  Skin: Negative.  Negative for rash.    Neurological: Positive for tingling (toes) and sensory change (neuropathy in hands and feet; stinging in feet). Negative for dizziness, speech change, focal weakness, seizures, weakness and headaches.       Poor grip. Dropping objects.  Endo/Heme/Allergies: Negative.  Does not bruise/bleed easily.       Diabetes.  Psychiatric/Behavioral: Negative.  Negative for depression and memory loss. The patient is not nervous/anxious and does not have insomnia.   All other systems reviewed and are negative.  Performance status (ECOG): 1  Vitals Blood pressure 113/60, pulse 69, temperature (!) 97 F (36.1 C), temperature source Tympanic, resp. rate 18, weight 151 lb (68.5 kg), SpO2 100 %.   Physical Exam  Constitutional: She is oriented to person, place, and time. She appears well-developed and well-nourished. No distress.  HENT:  Head: Normocephalic and atraumatic.  Mouth/Throat: Oropharynx is clear and moist. Mucous membranes are dry. No oropharyngeal exudate.  Curly dark blonde hair pulled up. Mask.  Eyes: Pupils are equal, round, and reactive to light. Conjunctivae and EOM are normal. No scleral icterus.  Glasses. Brown eyes.  Neck: No JVD present.  Cardiovascular: Normal rate, regular rhythm and normal heart sounds.  No murmur heard. Pulmonary/Chest: Effort normal and breath sounds normal. No respiratory distress. She has no wheezes. She has no rales.  Port-a-cath accessed.  Abdominal: Soft. Bowel sounds are normal. She exhibits no distension and no mass. There is no abdominal tenderness. There is no rebound and no guarding.  Musculoskeletal:        General: No tenderness or edema. Normal range of motion.     Cervical back: Normal range of motion and neck supple.  Lymphadenopathy:       Head (right side): No preauricular, no posterior auricular and no occipital adenopathy present.       Head (left side): No preauricular, no posterior auricular and no occipital adenopathy present.    She  has no cervical adenopathy.    She has no axillary adenopathy.       Right: No inguinal and no supraclavicular adenopathy present.       Left: No inguinal and no supraclavicular adenopathy present.  Neurological: She is alert and oriented to person, place, and time.  Skin: Skin is warm and dry. No rash noted. She is not diaphoretic. No erythema. No pallor.  Psychiatric: She has a normal mood and affect. Her behavior is normal. Judgment and thought content normal.  Nursing note and vitals reviewed.   Infusion on 10/04/2019  Component Date Value Ref Range Status  . Sodium 10/04/2019 136  135 - 145 mmol/L Final  . Potassium 10/04/2019 4.4  3.5 - 5.1 mmol/L Final  . Chloride 10/04/2019 106  98 - 111 mmol/L Final  . CO2 10/04/2019 23  22 - 32 mmol/L Final  . Glucose, Bld 10/04/2019 141* 70 - 99 mg/dL Final   Glucose reference range applies only to samples taken after fasting for at least 8 hours.  . BUN 10/04/2019 28* 8 - 23 mg/dL Final  . Creatinine, Ser 10/04/2019 1.17* 0.44 - 1.00 mg/dL Final  . Calcium 10/04/2019 8.5* 8.9 - 10.3 mg/dL Final  . Total Protein 10/04/2019 6.3* 6.5 - 8.1 g/dL Final  . Albumin 10/04/2019 3.9  3.5 - 5.0 g/dL Final  . AST 10/04/2019 22  15 - 41 U/L Final  . ALT 10/04/2019 24  0 - 44 U/L Final  . Alkaline Phosphatase 10/04/2019 73  38 - 126 U/L Final  . Total Bilirubin 10/04/2019 0.5  0.3 - 1.2 mg/dL Final  . GFR calc non Af Amer 10/04/2019 50* >60 mL/min Final  . GFR calc Af Amer 10/04/2019 58* >60 mL/min Final  . Anion gap 10/04/2019 7  5 - 15 Final   Performed at [facility], [address], [place], [place] [address]  . WBC 10/04/2019 4.1  4.0 - 10.5 K/uL Final  . RBC 10/04/2019 3.43* 3.87 - 5.11 MIL/uL Final  . Hemoglobin 10/04/2019 10.5* 12.0 - 15.0 g/dL Final  . HCT 10/04/2019 30.6* 36.0 - 46.0 % Final  . MCV 10/04/2019 89.2  80.0 - 100.0 fL Final  . MCH 10/04/2019 30.6  26.0 - 34.0 pg Final  . MCHC 10/04/2019 34.3  30.0 - 36.0 g/dL  Final  . RDW 10/04/2019 14.1  11.5 - 15.5 % Final  . Platelets 10/04/2019 92* 150 - 400 K/uL Final   Comment: Immature Platelet Fraction may be clinically indicated, consider ordering this additional test [id number]   . nRBC 10/04/2019 0.0  0.0 - 0.2 % Final  . Neutrophils Relative % 10/04/2019 78  % Final  . Neutro Abs 10/04/2019 3.2  1.7 - 7.7 K/uL Final  . Lymphocytes Relative 10/04/2019 8  % Final  . Lymphs Abs 10/04/2019 0.3* 0.7 - 4.0 K/uL Final  . Monocytes Relative 10/04/2019 6  % Final  . Monocytes Absolute 10/04/2019 0.2  0.1 - 1.0 K/uL Final  . Eosinophils Relative 10/04/2019 7  % Final  . Eosinophils  Absolute 10/04/2019 0.3  0.0 - 0.5 K/uL Final  . Basophils Relative 10/04/2019 0  % Final  . Basophils Absolute 10/04/2019 0.0  0.0 - 0.1 K/uL Final  . Immature Granulocytes 10/04/2019 1  % Final  . Abs Immature Granulocytes 10/04/2019 0.02  0.00 - 0.07 K/uL Final   Performed at [facility], [address] [place], [place] [address]  . Magnesium 10/04/2019 1.3* 1.7 - 2.4 mg/dL Final   Performed at [facility], [address] [place], [place] [address]    Assessment:  Aayra Hornbaker is a 64 y.o. female with stage III IgA lambda light chain multiple myeloma s/p autologous stem cell transplant in 06/14/2015 at the [facility] [place]. Bone marrow revealed 80% plasma cells. Lambda free light chains were 1340. She had nephrotic range proteinuria. She initially underwent induction with RVD. Revlimid maintenance was discontinued on 01/21/2017 secondary to intolerance.   Bone marrowaspirate andbiopsyon 01/18/2021revealed anormocellularmarrow withbut increased lambda-restricted plasma cells (9% aspirate, 40% CD138 immunohistochemistry).Findingswereconsistent with recurrent plasma cell myeloma.Flow cytometry revealed no monoclonal B-cell or phenotypically aberrant T-cell population. Cytogeneticswere 62, XX (normal). 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  M-spikehas been followed: 0 on 04/02/2016 -06/23/2019; and0.2 on 09/02/2017.  Lambda light chainshave been followed: 22.2 (ratio 0.56) on 07/03/2017, 30.8 (ratio 0.78) on 09/02/2017, 36.9 (ratio 0.40) on 10/21/2017, 37.4 (ratio 0.41) on 12/16/2017, 70.7(ratio 0.31) on 02/17/2018, 64.2 (ratio 0.27) on 04/07/2018, 78.9 (ratio 0.18) on 05/26/2018, 128.8 (ratio 0.17) on 08/06/2018, 181.5 (ratio 0.13) on 10/08/2018, 130.9 (ratio 0.13) on 10/20/2018, 160.7 (ratio 0.10)on 12/09/2018, 236.6 (ratio 0.07) on 02/01/2019, 363.6 (ratio 0.04) on 03/22/2019, 404.8 (ratio 0.04) on 04/05/2019, 420.7 (ratio 0.03) on 05/24/2019, 573.4 (ratio 0.03) on 06/23/2019, and 451.05(ratio 0.02) on02/19/2021.  24 hour UPEPon 06/03/2019 revealed kappa free light chains95.76,lambda free light chains1,260.71, andratio 0.08.24 hourUPEPon 02/22/2021revealed totalproteinof744m/24 hrs withlambdafree light chains 1,084.14mL andratioof0.10(1.03-31.76). M spike inurinewas46.1%(36146m4 hrs).  Bone surveyon 04/08/2016 and11/28/2018 revealed no definite lytic lesion seen in the visualized skeleton.Bone surveyon 11/19/2018 revealed no suspicious lucent lesionsand no acute bony abnormality.PET scanon 07/12/2019 revealed no focal metabolic activity to suggest active myeloma within the skeleton. There wereno lytic lesions identified on the CT portion of the examorsoft tissue plasmacytomas. There was no evidence of multiple myeloma.  Pretreatment RBC phenotype on 09/23/2019 was positive for C, e, DUFFY B, KIDD B, M, S, and s antigen; negative for c, E, KELL, DUFFY A, KIDD A, and N antigen.   She is day 8 of cycle #1 daratumumab and hyaluronidase-fihj, Pomalyst, and Decadron (DPd) (began 09/27/2019).   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 Zometa was discontinued in 01/2017.  She has chronic nausea on Phenergan.  She has  B12 deficiency. B12 was 254 on 04/09/2017, 295 on 08/20/2018, and 391 on 10/08/2018. Shewasonoral B12.She received B12 monthly (last04/07/2019).Folate was 18.6 on 02/08/2019.  She has iron deficiency. Ferritin was 32 on 07/01/2019. She received Venoferon 07/15/2019 and 07/22/2019.  She received her first COVID-19 vaccine on 09/20/2019.  Symptomatically, she is doing well.  Bowel are variable (diarrhea, constipation, normal).  She has chronic nausea (slightly worse).  She has a chronic neuropathy.  Plan: 1.    Labs today: CBC with diff, CMP, Mg, ferritin. 2. Stage III multiple myeloma Sheiss/pautologous stem cell transplant in 06/2015.  Shehas remainedoff maintenance Revlimid secondary to intolerance.  M spikewas0 on 06/23/2019. Lambda light chainshave increased over the last several months:. Lambda light chainswere451.05(ratio 0.02) on02/19/2021. PET scan on  07/12/2019 revealed no metabolic bone lesions. 24 hour UPEPwithfree light chains revealedlambda free light chains 1,260.71,andratio 0.08. Bone marrowon01/18/2021revealedanincreased lambda-restricted plasma cells (9% aspirate, 40% CD138 immunohistochemistry).  Findingsare c/wrecurrent plasma cell myeloma. 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 She began daratumumab + Pomalyst and dexamethasone (DPd) last week.   She tolerated her injection of SQ version of daratumumab (daratumumab and hyaluronidase-fihj) well.                         Plan is to receive one cycle with disease reassessment at [facility] (cycle length 28 days).                         Plan:    Pomalidomide (Pomalyst) 4 mg po q day 1-21.                                     Decadron 20 mg po day 1-2, day 8-9, 15-16, and 22-23 with cycle #1 and 2.                                      Decadron 20 mg po on days 1-2 and 15-16 x 4 cycles (if needed).                                     Daratumumab and hyaluronidase-fihj (Darzalez Faspro) 1800 mg SQ on days 1, 8, 15, and 22.                         Premeds for [employer]:                                      Singulair 10 mg po day before, day of, and 2 days.                                     Ondansetron 8 mg po, Benadryl 50 mg po, and Tylenol 650 mg po.    Confirmed meds taken at home (Benadryl, Singulair, Tylenol). Patient has a residual neuropathy. 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  Labs reviewed.  Day 8 of cycle #1 daratumumab and hyaluronidase-fihj today.   Continue Pomalyst and Decadron. 3. Normocytic anemia Hematocrit30.6. Hemoglobin10.5. MCV89.2.Platelets92,000today. Ferritin128today. She received Venoferweekly x 3 (07/15/2019-07/29/2019). Ferritin goal 100. Suspect anemia secondary to underlying myeloma.  Continue to monitor. 4. Hypomagnesemia, chronic Magnesium1.3today. Magnesium has fluctuated between 1.0-1.4 in the past 2 months. Magnesium sulfate 4 gm IV today.  Monitor weekly when she comes in for treatment. 5. Osteonecrosis of the jaw ONJ occurred in 01/2017 secondary to Zometa. She receives no further bisphosphonates. 6. Chronic back pain Etiologynotsecondary to multiple myeloma. Sheis [facility] and is on Fentanyl patches. 7. Grade I-II peripheral neuropathy Etiologyfelt secondary toprior chemotherapy. By history, neuropathy appears stable.  Continue to monitor closely on Pomalyst. 8.Chronic nausea Patient is followed by Dr.  Vicente Males. Rx: Phenergan (dis: #60).  Discuss consideration of granisetron patch. 9.B12 deficiency B12 was248on  02/08/2019. She is on monthly B12 (last04/07/2019). Folate was18.6on 02/08/2019. Check folate annually. 10. Hypocalcemia Calcium 8.5. Encourage calcium and vitamin D. 11.Diarrhea             Diarrhea occurred s/p COVID-19 injection.             Stool for C diff and GI panel by PCR  On 09/23/2019 were negative.             Symptomatically, patient notes bowels fluctuate (constiaption, diarrhea, normal). 12.   RTC in 1 week for MD assessment, labs (CBC with diff, CMP, Mg), day 15 of cycle #1 Darzalez Faspro and IV Mg.  Addendum:  Granisetron patch interacts with trazodone and has the risk of serotonin syndrome.  Serotonin syndrome included the risk for hyperreflexia, clonus, hyperthermia, diaphoresis, tremor, autonomic instability, mental status changes when these drugs are combined. Will discuss next week regarding need for trazodone.    I discussed the assessment and treatment plan with the patient.  The patient was provided an opportunity to ask questions and all were answered.  The patient agreed with the plan and demonstrated an understanding of the instructions.  The patient was advised to call back if the symptoms worsen or if the condition fails to improve as anticipated.    Lequita Asal, MD, PhD    10/04/2019, 9:29 AM  I, Selena Batten, am acting as scribe for [employer]. Mike Gip, MD, PhD.  I, Ashraf Mesta C. Mike Gip, MD, have reviewed the above documentation for accuracy and completeness, and I agree with the above.

## 2019-09-30 NOTE — Patient Instructions (Signed)
Magnesium Sulfate injection What is this medicine? MAGNESIUM SULFATE (mag NEE zee um SUL fate) is an electrolyte injection commonly used to treat low magnesium levels in your blood. It is also used to prevent or control seizures in women with preeclampsia or eclampsia. This medicine may be used for other purposes; ask your health care provider or pharmacist if you have questions. What should I tell my health care provider before I take this medicine? They need to know if you have any of these conditions:  heart disease  history of irregular heart beat  kidney disease  an unusual or allergic reaction to magnesium sulfate, medicines, foods, dyes, or preservatives  pregnant or trying to get pregnant  breast-feeding How should I use this medicine? This medicine is for infusion into a vein. It is given by a health care professional in a hospital or clinic setting. Talk to your pediatrician regarding the use of this medicine in children. While this drug may be prescribed for selected conditions, precautions do apply. Overdosage: If you think you have taken too much of this medicine contact a poison control center or emergency room at once. NOTE: This medicine is only for you. Do not share this medicine with others. What if I miss a dose? This does not apply. What may interact with this medicine? This medicine may interact with the following medications:  certain medicines for anxiety or sleep  certain medicines for seizures like phenobarbital  digoxin  medicines that relax muscles for surgery  narcotic medicines for pain This list may not describe all possible interactions. Give your health care provider a list of all the medicines, herbs, non-prescription drugs, or dietary supplements you use. Also tell them if you smoke, drink alcohol, or use illegal drugs. Some items may interact with your medicine. What should I watch for while using this medicine? Your condition will be  monitored carefully while you are receiving this medicine. You may need blood work done while you are receiving this medicine. What side effects may I notice from receiving this medicine? Side effects that you should report to your doctor or health care professional as soon as possible:  allergic reactions like skin rash, itching or hives, swelling of the face, lips, or tongue  facial flushing  muscle weakness  signs and symptoms of low blood pressure like dizziness; feeling faint or lightheaded, falls; unusually weak or tired  signs and symptoms of a dangerous change in heartbeat or heart rhythm like chest pain; dizziness; fast or irregular heartbeat; palpitations; breathing problems  sweating This list may not describe all possible side effects. Call your doctor for medical advice about side effects. You may report side effects to FDA at 1-800-FDA-1088. Where should I keep my medicine? This drug is given in a hospital or clinic and will not be stored at home. NOTE: This sheet is a summary. It may not cover all possible information. If you have questions about this medicine, talk to your doctor, pharmacist, or health care provider.  2020 Elsevier/Gold Standard (2016-01-03 12:31:42)  

## 2019-10-01 ENCOUNTER — Encounter: Payer: Self-pay | Admitting: Hematology and Oncology

## 2019-10-01 ENCOUNTER — Other Ambulatory Visit: Payer: Self-pay

## 2019-10-01 NOTE — Progress Notes (Signed)
No new changes noted today. The patient Name and DOB has been verified by phone today. 

## 2019-10-03 ENCOUNTER — Other Ambulatory Visit: Payer: Self-pay | Admitting: Hematology and Oncology

## 2019-10-03 DIAGNOSIS — D500 Iron deficiency anemia secondary to blood loss (chronic): Secondary | ICD-10-CM

## 2019-10-03 DIAGNOSIS — Z5112 Encounter for antineoplastic immunotherapy: Secondary | ICD-10-CM | POA: Insufficient documentation

## 2019-10-04 ENCOUNTER — Inpatient Hospital Stay: Payer: Medicare PPO

## 2019-10-04 ENCOUNTER — Encounter: Payer: Self-pay | Admitting: Hematology and Oncology

## 2019-10-04 ENCOUNTER — Other Ambulatory Visit: Payer: Self-pay

## 2019-10-04 ENCOUNTER — Inpatient Hospital Stay: Payer: Medicare PPO | Admitting: Hematology and Oncology

## 2019-10-04 VITALS — BP 101/62 | HR 69 | Resp 18

## 2019-10-04 VITALS — BP 113/60 | HR 69 | Temp 97.0°F | Resp 18 | Wt 151.0 lb

## 2019-10-04 DIAGNOSIS — C9000 Multiple myeloma not having achieved remission: Secondary | ICD-10-CM | POA: Diagnosis not present

## 2019-10-04 DIAGNOSIS — D649 Anemia, unspecified: Secondary | ICD-10-CM | POA: Diagnosis not present

## 2019-10-04 DIAGNOSIS — R197 Diarrhea, unspecified: Secondary | ICD-10-CM | POA: Diagnosis not present

## 2019-10-04 DIAGNOSIS — Z5112 Encounter for antineoplastic immunotherapy: Secondary | ICD-10-CM | POA: Diagnosis not present

## 2019-10-04 DIAGNOSIS — R110 Nausea: Secondary | ICD-10-CM

## 2019-10-04 DIAGNOSIS — G620 Drug-induced polyneuropathy: Secondary | ICD-10-CM

## 2019-10-04 DIAGNOSIS — R0982 Postnasal drip: Secondary | ICD-10-CM | POA: Diagnosis not present

## 2019-10-04 DIAGNOSIS — E538 Deficiency of other specified B group vitamins: Secondary | ICD-10-CM

## 2019-10-04 DIAGNOSIS — D500 Iron deficiency anemia secondary to blood loss (chronic): Secondary | ICD-10-CM

## 2019-10-04 DIAGNOSIS — M791 Myalgia, unspecified site: Secondary | ICD-10-CM | POA: Diagnosis not present

## 2019-10-04 DIAGNOSIS — T451X5A Adverse effect of antineoplastic and immunosuppressive drugs, initial encounter: Secondary | ICD-10-CM | POA: Diagnosis not present

## 2019-10-04 DIAGNOSIS — I130 Hypertensive heart and chronic kidney disease with heart failure and stage 1 through stage 4 chronic kidney disease, or unspecified chronic kidney disease: Secondary | ICD-10-CM | POA: Diagnosis not present

## 2019-10-04 DIAGNOSIS — R61 Generalized hyperhidrosis: Secondary | ICD-10-CM | POA: Diagnosis not present

## 2019-10-04 LAB — COMPREHENSIVE METABOLIC PANEL
ALT: 24 U/L (ref 0–44)
AST: 22 U/L (ref 15–41)
Albumin: 3.9 g/dL (ref 3.5–5.0)
Alkaline Phosphatase: 73 U/L (ref 38–126)
Anion gap: 7 (ref 5–15)
BUN: 28 mg/dL — ABNORMAL HIGH (ref 8–23)
CO2: 23 mmol/L (ref 22–32)
Calcium: 8.5 mg/dL — ABNORMAL LOW (ref 8.9–10.3)
Chloride: 106 mmol/L (ref 98–111)
Creatinine, Ser: 1.17 mg/dL — ABNORMAL HIGH (ref 0.44–1.00)
GFR calc Af Amer: 58 mL/min — ABNORMAL LOW (ref >60)
GFR calc non Af Amer: 50 mL/min — ABNORMAL LOW (ref >60)
Glucose, Bld: 141 mg/dL — ABNORMAL HIGH (ref 70–99)
Potassium: 4.4 mmol/L (ref 3.5–5.1)
Sodium: 136 mmol/L (ref 135–145)
Total Bilirubin: 0.5 mg/dL (ref 0.3–1.2)
Total Protein: 6.3 g/dL — ABNORMAL LOW (ref 6.5–8.1)

## 2019-10-04 LAB — CBC WITH DIFFERENTIAL/PLATELET
Abs Immature Granulocytes: 0.02 10*3/uL (ref 0.00–0.07)
Basophils Absolute: 0 10*3/uL (ref 0.0–0.1)
Basophils Relative: 0 %
Eosinophils Absolute: 0.3 10*3/uL (ref 0.0–0.5)
Eosinophils Relative: 7 %
HCT: 30.6 % — ABNORMAL LOW (ref 36.0–46.0)
Hemoglobin: 10.5 g/dL — ABNORMAL LOW (ref 12.0–15.0)
Immature Granulocytes: 1 %
Lymphocytes Relative: 8 %
Lymphs Abs: 0.3 10*3/uL — ABNORMAL LOW (ref 0.7–4.0)
MCH: 30.6 pg (ref 26.0–34.0)
MCHC: 34.3 g/dL (ref 30.0–36.0)
MCV: 89.2 fL (ref 80.0–100.0)
Monocytes Absolute: 0.2 10*3/uL (ref 0.1–1.0)
Monocytes Relative: 6 %
Neutro Abs: 3.2 10*3/uL (ref 1.7–7.7)
Neutrophils Relative %: 78 %
Platelets: 92 10*3/uL — ABNORMAL LOW (ref 150–400)
RBC: 3.43 MIL/uL — ABNORMAL LOW (ref 3.87–5.11)
RDW: 14.1 % (ref 11.5–15.5)
WBC: 4.1 10*3/uL (ref 4.0–10.5)
nRBC: 0 % (ref 0.0–0.2)

## 2019-10-04 LAB — MAGNESIUM: Magnesium: 1.3 mg/dL — ABNORMAL LOW (ref 1.7–2.4)

## 2019-10-04 LAB — FERRITIN: Ferritin: 128 ng/mL (ref 11–307)

## 2019-10-04 MED ORDER — SODIUM CHLORIDE 0.9 % IV SOLN
Freq: Once | INTRAVENOUS | Status: AC
  Filled 2019-10-04: qty 250

## 2019-10-04 MED ORDER — DEXAMETHASONE 4 MG PO TABS
20.0000 mg | ORAL_TABLET | Freq: Once | ORAL | Status: AC
  Administered 2019-10-04: 20 mg via ORAL

## 2019-10-04 MED ORDER — MAGNESIUM SULFATE 4 GM/100ML IV SOLN
4.0000 g | Freq: Once | INTRAVENOUS | Status: AC
  Administered 2019-10-04: 4 g via INTRAVENOUS

## 2019-10-04 MED ORDER — HEPARIN SOD (PORK) LOCK FLUSH 100 UNIT/ML IV SOLN
500.0000 [IU] | Freq: Once | INTRAVENOUS | Status: AC
  Administered 2019-10-04: 500 [IU] via INTRAVENOUS
  Filled 2019-10-04: qty 5

## 2019-10-04 MED ORDER — DARATUMUMAB-HYALURONIDASE-FIHJ 1800-30000 MG-UT/15ML ~~LOC~~ SOLN
1800.0000 mg | Freq: Once | SUBCUTANEOUS | Status: AC
  Administered 2019-10-04: 1800 mg via SUBCUTANEOUS
  Filled 2019-10-04: qty 15

## 2019-10-04 MED ORDER — SODIUM CHLORIDE 0.9% FLUSH
10.0000 mL | INTRAVENOUS | Status: DC | PRN
  Administered 2019-10-04: 10 mL via INTRAVENOUS
  Filled 2019-10-04: qty 10

## 2019-10-04 NOTE — Patient Instructions (Addendum)
Daratumumab injection What is this medicine? DARATUMUMAB (dar a toom ue mab) is a monoclonal antibody. It is used to treat multiple myeloma. This medicine may be used for other purposes; ask your health care provider or pharmacist if you have questions. COMMON BRAND NAME(S): DARZALEX What should I tell my health care provider before I take this medicine? They need to know if you have any of these conditions:  infection (especially a virus infection such as chickenpox, herpes, or hepatitis B virus)  lung or breathing disease  an unusual or allergic reaction to daratumumab, other medicines, foods, dyes, or preservatives  pregnant or trying to get pregnant  breast-feeding How should I use this medicine? This medicine is for infusion into a vein. It is given by a health care professional in a hospital or clinic setting. Talk to your pediatrician regarding the use of this medicine in children. Special care may be needed. Overdosage: If you think you have taken too much of this medicine contact a poison control center or emergency room at once. NOTE: This medicine is only for you. Do not share this medicine with others. What if I miss a dose? Keep appointments for follow-up doses as directed. It is important not to miss your dose. Call your doctor or health care professional if you are unable to keep an appointment. What may interact with this medicine? Interactions have not been studied. This list may not describe all possible interactions. Give your health care provider a list of all the medicines, herbs, non-prescription drugs, or dietary supplements you use. Also tell them if you smoke, drink alcohol, or use illegal drugs. Some items may interact with your medicine. What should I watch for while using this medicine? This drug may make you feel generally unwell. Report any side effects. Continue your course of treatment even though you feel ill unless your doctor tells you to stop. This  medicine can cause serious allergic reactions. To reduce your risk you may need to take medicine before treatment with this medicine. Take your medicine as directed. This medicine can affect the results of blood tests to match your blood type. These changes can last for up to 6 months after the final dose. Your healthcare provider will do blood tests to match your blood type before you start treatment. Tell all of your healthcare providers that you are being treated with this medicine before receiving a blood transfusion. This medicine can affect the results of some tests used to determine treatment response; extra tests may be needed to evaluate response. Do not become pregnant while taking this medicine or for 3 months after stopping it. Women should inform their doctor if they wish to become pregnant or think they might be pregnant. There is a potential for serious side effects to an unborn child. Talk to your health care professional or pharmacist for more information. What side effects may I notice from receiving this medicine? Side effects that you should report to your doctor or health care professional as soon as possible:  allergic reactions like skin rash, itching or hives, swelling of the face, lips, or tongue  breathing problems  chills  cough  dizziness  feeling faint or lightheaded  headache  low blood counts - this medicine may decrease the number of white blood cells, red blood cells and platelets. You may be at increased risk for infections and bleeding.  nausea, vomiting  shortness of breath  signs of decreased platelets or bleeding - bruising, pinpoint red spots on  the skin, black, tarry stools, blood in the urine  signs of decreased red blood cells - unusually weak or tired, feeling faint or lightheaded, falls  signs of infection - fever or chills, cough, sore throat, pain or difficulty passing urine  signs and symptoms of liver injury like dark yellow or brown  urine; general ill feeling or flu-like symptoms; light-colored stools; loss of appetite; right upper belly pain; unusually weak or tired; yellowing of the eyes or skin Side effects that usually do not require medical attention (report to your doctor or health care professional if they continue or are bothersome):  back pain  constipation  diarrhea  joint pain  muscle cramps  pain, tingling, numbness in the hands or feet  swelling of the ankles, feet, hands  tiredness  trouble sleeping This list may not describe all possible side effects. Call your doctor for medical advice about side effects. You may report side effects to FDA at 1-800-FDA-1088. Where should I keep my medicine? This drug is given in a hospital or clinic and will not be stored at home. NOTE: This sheet is a summary. It may not cover all possible information. If you have questions about this medicine, talk to your doctor, pharmacist, or health care provider.  2020 [employer]/Gold Standard (2019-02-23 18:10:54) Magnesium Sulfate injection What is this medicine? MAGNESIUM SULFATE (mag NEE zee um SUL fate) is an electrolyte injection commonly used to treat low magnesium levels in your blood. It is also used to prevent or control seizures in women with preeclampsia or eclampsia. This medicine may be used for other purposes; ask your health care provider or pharmacist if you have questions. What should I tell my health care provider before I take this medicine? They need to know if you have any of these conditions:  heart disease  history of irregular heart beat  kidney disease  an unusual or allergic reaction to magnesium sulfate, medicines, foods, dyes, or preservatives  pregnant or trying to get pregnant  breast-feeding How should I use this medicine? This medicine is for infusion into a vein. It is given by a health care professional in a hospital or clinic setting. Talk to your pediatrician regarding the use  of this medicine in children. While this drug may be prescribed for selected conditions, precautions do apply. Overdosage: If you think you have taken too much of this medicine contact a poison control center or emergency room at once. NOTE: This medicine is only for you. Do not share this medicine with others. What if I miss a dose? This does not apply. What may interact with this medicine? This medicine may interact with the following medications:  certain medicines for anxiety or sleep  certain medicines for seizures like phenobarbital  digoxin  medicines that relax muscles for surgery  narcotic medicines for pain This list may not describe all possible interactions. Give your health care provider a list of all the medicines, herbs, non-prescription drugs, or dietary supplements you use. Also tell them if you smoke, drink alcohol, or use illegal drugs. Some items may interact with your medicine. What should I watch for while using this medicine? Your condition will be monitored carefully while you are receiving this medicine. You may need blood work done while you are receiving this medicine. What side effects may I notice from receiving this medicine? Side effects that you should report to your doctor or health care professional as soon as possible:  allergic reactions like skin rash, itching or hives,  swelling of the face, lips, or tongue  facial flushing  muscle weakness  signs and symptoms of low blood pressure like dizziness; feeling faint or lightheaded, falls; unusually weak or tired  signs and symptoms of a dangerous change in heartbeat or heart rhythm like chest pain; dizziness; fast or irregular heartbeat; palpitations; breathing problems  sweating This list may not describe all possible side effects. Call your doctor for medical advice about side effects. You may report side effects to FDA at 1-800-FDA-1088. Where should I keep my medicine? This drug is given in a  hospital or clinic and will not be stored at home. NOTE: This sheet is a summary. It may not cover all possible information. If you have questions about this medicine, talk to your doctor, pharmacist, or health care provider.  2020 Elsevier/Gold Standard (2016-01-03 12:31:42)

## 2019-10-04 NOTE — Progress Notes (Signed)
Per MD ok to treat with plts 92

## 2019-10-04 NOTE — Progress Notes (Signed)
Pharmacist Chemotherapy Monitoring - Follow Up Assessment    I verify that I have reviewed each item in the below checklist:  . Regimen for the patient is scheduled for the appropriate day and plan matches scheduled date. Marland Kitchen Appropriate non-routine labs are ordered dependent on drug ordered. . If applicable, additional medications reviewed and ordered per protocol based on lifetime cumulative doses and/or treatment regimen.   Plan for follow-up and/or issues identified: No . I-vent associated with next due treatment: No . MD and/or nursing notified: No  Deneice Wack K 10/04/2019 12:02 PM

## 2019-10-04 NOTE — Progress Notes (Signed)
Patient would like to know if she should proceed with second covid vaccine on 10/11/19 since she has now started treatment.

## 2019-10-05 MED ORDER — PROMETHAZINE HCL 25 MG PO TABS: 25.0000 mg | ORAL_TABLET | Freq: Two times a day (BID) | ORAL | 0 refills | Status: DC | PRN

## 2019-10-07 NOTE — Progress Notes (Signed)
[facility]  [address] [place], [place] [address] Phone: [phone]  Fax: [phone]   Clinic Day:  10/11/2019  Referring physician: Glean Hess, MD  Chief Complaint: Sarah [name] is a 64 y.o. 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   HPI: The patient was last seen in the medical oncology clinic on 10/04/2019. At that time, she was doing well.  Bowel were variable (diarrhea, constipation, normal).  She had chronic nausea (slightly worse).  She had a chronic neuropathy.  Hematocrit was 30.6, hemoglobin 10.5, platelets 92,000, WBC 4,100 (ANC 3,200; ALC 300). Ferritin was 128.  Creatinine was 1.17, calcium 8.5, and magnesium 1.3.  She received day 8 of cycle #1 daratumumab and hyaluronidase-fihj, Pomalyst and Decadron. She received magnesium 4gm IV.  During the interim, she has felt "ok".  She reports tolerating treatment well. Patient reports that she had great energy last Monday and Tuesday but it faded by Friday. She reports feeling a "zapping or electric feeling in her head" at times.  She has felt a little dizzy at times.  She has increased nausea and it interferes with eating; she is losing her taste as well. She is drinking fluids. She has had a sore throat which is worse in the mornings.  She denies any fever  She notes leg cramps that started Wednesday; she took a muscle relaxer which helped.  She experienced upper back/shoulder blade pain around Friday. She continues to have night sweats. She reports having alternating diarrhea and constipation. She notes chronic numbness in her fingers and toes with no change in function. She received her second COVID-19 vaccine on 10/09/2019.  She did take Tylenol, Benadryl, Allegra, and Singulair this morning.   Past Medical History:    Diagnosis Date  . Abnormal stress test 02/14/2016   Overview:  Added automatically from request for surgery [id number]  . Anemia   . Anxiety   . Arthritis   . Bicuspid aortic valve   . CHF (congestive heart failure) ([place])   . CKD (chronic kidney disease) stage 3, GFR 30-59 ml/min   . Depression   . Diabetes mellitus ([place])   . Dizziness   . Fatty liver   . Frequent falls   . GERD (gastroesophageal reflux disease)   . Gout   . Heart murmur   . History of blood transfusion   . History of bone marrow transplant ([place])   . History of uterine fibroid   . Hx of cardiac catheterization 06/05/2016   Overview:  Normal coronaries 2017  . Hypertension   . Hypomagnesemia   . Multiple myeloma ([place])   . Personal history of chemotherapy   . Renal cyst     Past Surgical History:  Procedure Laterality Date  . ABDOMINAL HYSTERECTOMY    . 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 CARDIAC ELECTROPHYSIOLOGY MAPPING AND ABLATION    . CARPAL TUNNEL RELEASE Bilateral   . CHOLECYSTECTOMY  2008  . COLONOSCOPY WITH PROPOFOL N/A 05/07/2017   Procedure: COLONOSCOPY WITH PROPOFOL;  Surgeon: [name], MD;  Location: [facility] ENDOSCOPY;  Service: Gastroenterology;  Laterality: N/A;  . ESOPHAGOGASTRODUODENOSCOPY (EGD) WITH PROPOFOL N/A 05/07/2017   Procedure: ESOPHAGOGASTRODUODENOSCOPY (EGD) WITH PROPOFOL;  Surgeon: [name], MD;  Location: [facility] ENDOSCOPY;  Service: Gastroenterology;  Laterality: N/A;  . FOOT SURGERY Bilateral   . INCONTINENCE  SURGERY  2009  . INTERSTIM IMPLANT PLACEMENT    . other     over active bladder  . OTHER SURGICAL HISTORY     bladder stimulator   . PARTIAL HYSTERECTOMY  03/1996   fibroids  . PORTA CATH INSERTION N/A 03/10/2019   Procedure: PORTA CATH INSERTION;  Surgeon: Algernon Huxley, MD;  Location: [facility] LAB;  Service: Cardiovascular;  Laterality: N/A;  . TONSILLECTOMY  2007    Family History  Problem Relation Age of Onset  . Colon cancer Father   . Renal Disease  Father   . Diabetes Mellitus II Father   . Melanoma Paternal Grandmother   . Breast cancer Maternal Aunt 16  . Anemia Mother   . Heart disease Mother   . Heart failure Mother   . Renal Disease Mother   . Congestive Heart Failure Mother   . Heart disease Maternal Uncle   . Throat cancer Maternal Uncle   . Lung cancer Maternal Uncle   . Liver disease Maternal Uncle   . Heart failure Maternal Uncle   . Hearing loss Son 46       Suicide     Social History:  reports that she quit smoking about 28 years ago. Her smoking use included cigarettes. She has a 20.00 pack-year smoking history. She has never used smokeless tobacco. She reports previous alcohol use. She reports that she does not use drugs. Patient has not had ETOH in several months. She is on disability. She notes exposure to perchloroethylene [facility]).She lives much of her adult life in [place]. She was married with 2 sons. Her husband passed away. Her 2 sons took their own lives (age 62 and 27). She worked in [employer] in [employer]. She went back to school and earned an [employer] and works for [employer] for several years.She liveswith her sistersin [place]. The patient is alone today.  Allergies:  Allergies  Allergen Reactions  . Oxycodone-Acetaminophen Anaphylaxis    Swelling and rash  . Celebrex [Celecoxib] Diarrhea  . Codeine   . Benadryl [Diphenhydramine] Palpitations  . Morphine Itching and Rash  . Ondansetron Diarrhea  . Tylenol [Acetaminophen] Itching and Rash    Current Medications: Current Outpatient Medications  Medication Sig Dispense Refill  . acetaminophen (TYLENOL) 500 MG tablet Take 500 mg by mouth every 6 (six) hours as needed.    Marland Kitchen acyclovir (ZOVIRAX) 400 MG tablet Take 1 tablet (400 mg total) by mouth 2 (two) times daily. 60 tablet 11  . allopurinol (ZYLOPRIM) 100 MG tablet TAKE 1 TABLET(100 MG) BY MOUTH DAILY as needed 90 tablet 1  . aspirin 81 MG chewable tablet Chew 1 tablet  (81 mg total) by mouth daily. 30 tablet 0  . bisoprolol (ZEBETA) 10 MG tablet TAKE 1 TABLET(10 MG) BY MOUTH DAILY 90 tablet 0  . dexamethasone (DECADRON) 4 MG tablet Take 5 tablets (36m) on days 2, 9, 16, 23 of cycle 1 and 2 Take 5 tablets (260m on days 2, 16 of cycle 3,4,5 and 6 Take 5 tablets (2070mon days 2 of cycles 7 and beyond 120 tablet 0  . diclofenac sodium (VOLTAREN) 1 % GEL Apply 2 g topically 4 (four) times daily. 100 g 1  . diphenoxylate-atropine (LOMOTIL) 2.5-0.025 MG tablet Take 2 tablets by mouth daily as needed for diarrhea or loose stools. 45 tablet 2  . fentaNYL (DURAGESIC - DOSED MCG/HR) 25 MCG/HR patch Place 25 mcg onto the skin every 3 (three) days.     .Marland Kitchen  fexofenadine (ALLEGRA) 180 MG tablet TAKE 1 TABLET(180 MG) BY MOUTH DAILY 30 tablet 5  . FLUoxetine (PROZAC) 40 MG capsule TAKE 1 CAPSULE EVERY DAY 90 capsule 1  . fluticasone (FLONASE) 50 MCG/ACT nasal spray Place 2 sprays into both nostrils daily. 16 g 2  . furosemide (LASIX) 20 MG tablet Take 20 mg by mouth as needed.     Marland Kitchen glucose blood (ONE TOUCH ULTRA TEST) test strip     . hydrOXYzine (ATARAX/VISTARIL) 10 MG tablet TAKE 1 TABLET(10 MG) BY MOUTH THREE TIMES DAILY AS NEEDED 90 tablet 0  . lidocaine-prilocaine (EMLA) cream APPLY TOPICALLY AS NEEDED. 30 g 0  . lisinopril (PRINIVIL,ZESTRIL) 10 MG tablet Take 10 mg by mouth daily.    . Magnesium Bisglycinate (MAG GLYCINATE) 100 MG TABS Take 100 mg by mouth 2 (two) times daily. 60 tablet 0  . metFORMIN (GLUCOPHAGE-XR) 500 MG 24 hr tablet Take 1 tablet (500 mg total) by mouth daily with breakfast. 90 tablet 1  . Misc Natural Products (OSTEO BI-FLEX ADV TRIPLE ST PO) Take 2 tablets by mouth daily.    . montelukast (SINGULAIR) 10 MG tablet Take 1 tablet by mouth on the day before treatment and for 2 days after treatment. 30 tablet 0  . Multiple Vitamins-Minerals (HAIR/SKIN/NAILS/BIOTIN PO) Take 1 capsule by mouth 3 (three) times daily.    Marland Kitchen NARCAN 4 MG/0.1ML LIQD nasal  spray kit 1 spray.    . NON FORMULARY     . Omega 3-6-9 Fatty Acids (OMEGA 3-6-9 COMPLEX) CAPS Take by mouth.    Marland Kitchen omeprazole (PRILOSEC) 40 MG capsule Take 1 capsule (40 mg total) by mouth daily. 90 capsule 1  . pravastatin (PRAVACHOL) 20 MG tablet TAKE 1 TABLET EVERY DAY 90 tablet 1  . promethazine (PHENERGAN) 25 MG tablet Take 1 tablet (25 mg total) by mouth 2 (two) times daily as needed for nausea or vomiting. 60 tablet 0  . tiZANidine (ZANAFLEX) 4 MG tablet Take 1 tablet (4 mg total) by mouth every 8 (eight) hours as needed for muscle spasms. 270 tablet 1  . traZODone (DESYREL) 100 MG tablet TAKE 1 TABLET AT BEDTIME  FOR  SLEEP 90 tablet 1  . POMALYST 4 MG capsule TAKE 1 CAPSULE (4MG TOTAL) BY MOUTH EVERY DAY FOR 21 DAYS, THEN HOLD FOR 7 DAYS. REPEAT EVERY 28 DAYS 21 capsule 0  . promethazine-dextromethorphan (PROMETHAZINE-DM) 6.25-15 MG/5ML syrup Take 5 mLs by mouth 4 (four) times daily as needed for cough. (Patient not taking: Reported on 09/22/2019) 240 mL 0   No current facility-administered medications for this visit.   Facility-Administered Medications Ordered in Other Visits  Medication Dose Route Frequency Provider Last Rate Last Admin  . heparin lock flush 100 unit/mL  500 Units Intravenous Once Lilyana Lippman C, MD      . sodium chloride flush (NS) 0.9 % injection 10 mL  10 mL Intravenous PRN Lequita Asal, MD   10 mL at 10/11/19 0836    Review of Systems  Constitutional: Positive for diaphoresis (night sweats) and malaise/fatigue (majority of the time). Negative for chills, fever and weight loss (up 1 lb).       Feels "ok".  HENT: Positive for sore throat (in the mornings). Negative for congestion, ear discharge, ear pain, hearing loss, nosebleeds and sinus pain.   Eyes: Negative for blurred vision, double vision and photophobia.  Respiratory: Negative.  Negative for cough, hemoptysis, sputum production and shortness of breath.   Cardiovascular: Negative.  Negative for  chest pain, palpitations and leg swelling.  Gastrointestinal: Positive for constipation, diarrhea and nausea (chronic s/p gallbladder surgery; worsening). Negative for abdominal pain, blood in stool, heartburn (controlled on medication), melena and vomiting.       Not eating well. Sensitive stomach. Drinking fluids.  Genitourinary: Negative for dysuria, frequency, hematuria and urgency.       Bladder leakage and spasms.  Musculoskeletal: Positive for back pain (chronic, spinal stenosis and pinched nerve; lower back s/p interstim), joint pain (arthritis; shoulder blades) and myalgias (leg cramps; worse at night; thighs ache less). Negative for neck pain (3 pinched nerves; resolved).  Skin: Negative.  Negative for itching and rash.  Neurological: Positive for sensory change (chronic numbness in fingers and toes). Negative for dizziness, tremors, speech change, focal weakness, weakness and headaches.       No change in chronic neuropathy.  Endo/Heme/Allergies: Does not bruise/bleed easily.       Diabetes.  Psychiatric/Behavioral: Negative.  Negative for depression and memory loss. The patient is not nervous/anxious and does not have insomnia.   All other systems reviewed and are negative.  Performance status (ECOG):  1-2  Vitals Blood pressure 95/61, pulse 73, temperature 97.9 F (36.6 C), temperature source Tympanic, resp. rate 18, height 5' 2.5" (1.588 m), weight 152 lb 0.1 oz (68.9 kg), SpO2 98 %.   Physical Exam  Constitutional: She is oriented to person, place, and time. She appears well-developed and well-nourished. No distress.  HENT:  Head: Normocephalic and atraumatic.  Mouth/Throat: Oropharynx is clear and moist. No oropharyngeal exudate.  Curly dark blonde hair pulled up. Mask.  Eyes: Pupils are equal, round, and reactive to light. Conjunctivae and EOM are normal. No scleral icterus.  Glasses. Brown eyes.  Cardiovascular: Normal rate, regular rhythm and normal heart sounds.  No  murmur heard. Pulmonary/Chest: Effort normal and breath sounds normal. No respiratory distress. She has no wheezes. She has no rales. She exhibits no tenderness.  Port-a-cath accessed.  Abdominal: Soft. Bowel sounds are normal. She exhibits no distension and no mass. There is no abdominal tenderness. There is no rebound and no guarding.  Musculoskeletal:        General: No tenderness or edema. Normal range of motion.     Cervical back: Normal range of motion and neck supple.  Lymphadenopathy:       Head (right side): No preauricular, no posterior auricular and no occipital adenopathy present.       Head (left side): No preauricular, no posterior auricular and no occipital adenopathy present.    She has no cervical adenopathy.    She has no axillary adenopathy.       Right: No inguinal and no supraclavicular adenopathy present.       Left: No inguinal and no supraclavicular adenopathy present.  Neurological: She is alert and oriented to person, place, and time.  Skin: Skin is warm and dry. She is not diaphoretic.  Psychiatric: She has a normal mood and affect. Her behavior is normal. Judgment and thought content normal.  Nursing note and vitals reviewed.   Infusion on 10/11/2019  Component Date Value Ref Range Status  . Sodium 10/11/2019 135  135 - 145 mmol/L Final  . Potassium 10/11/2019 4.3  3.5 - 5.1 mmol/L Final  . Chloride 10/11/2019 103  98 - 111 mmol/L Final  . CO2 10/11/2019 22  22 - 32 mmol/L Final  . Glucose, Bld 10/11/2019 165* 70 - 99 mg/dL Final   Glucose reference range applies only to samples  taken after fasting for at least 8 hours.  . BUN 10/11/2019 35* 8 - 23 mg/dL Final  . Creatinine, Ser 10/11/2019 1.38* 0.44 - 1.00 mg/dL Final  . Calcium 10/11/2019 8.6* 8.9 - 10.3 mg/dL Final  . Total Protein 10/11/2019 6.1* 6.5 - 8.1 g/dL Final  . Albumin 10/11/2019 3.7  3.5 - 5.0 g/dL Final  . AST 10/11/2019 20  15 - 41 U/L Final  . ALT 10/11/2019 25  0 - 44 U/L Final  .  Alkaline Phosphatase 10/11/2019 82  38 - 126 U/L Final  . Total Bilirubin 10/11/2019 0.4  0.3 - 1.2 mg/dL Final  . GFR calc non Af Amer 10/11/2019 41* >60 mL/min Final  . GFR calc Af Amer 10/11/2019 47* >60 mL/min Final  . Anion gap 10/11/2019 10  5 - 15 Final   Performed at [facility], [address], [place], [place] [address]  . WBC 10/11/2019 3.4* 4.0 - 10.5 K/uL Final  . RBC 10/11/2019 3.22* 3.87 - 5.11 MIL/uL Final  . Hemoglobin 10/11/2019 9.9* 12.0 - 15.0 g/dL Final  . HCT 10/11/2019 28.9* 36.0 - 46.0 % Final  . MCV 10/11/2019 89.8  80.0 - 100.0 fL Final  . MCH 10/11/2019 30.7  26.0 - 34.0 pg Final  . MCHC 10/11/2019 34.3  30.0 - 36.0 g/dL Final  . RDW 10/11/2019 13.9  11.5 - 15.5 % Final  . Platelets 10/11/2019 80* 150 - 400 K/uL Final   Comment: Immature Platelet Fraction may be clinically indicated, consider ordering this additional test [id number]   . nRBC 10/11/2019 0.0  0.0 - 0.2 % Final  . Neutrophils Relative % 10/11/2019 71  % Final  . Neutro Abs 10/11/2019 2.4  1.7 - 7.7 K/uL Final  . Lymphocytes Relative 10/11/2019 11  % Final  . Lymphs Abs 10/11/2019 0.4* 0.7 - 4.0 K/uL Final  . Monocytes Relative 10/11/2019 9  % Final  . Monocytes Absolute 10/11/2019 0.3  0.1 - 1.0 K/uL Final  . Eosinophils Relative 10/11/2019 7  % Final  . Eosinophils Absolute 10/11/2019 0.3  0.0 - 0.5 K/uL Final  . Basophils Relative 10/11/2019 1  % Final  . Basophils Absolute 10/11/2019 0.0  0.0 - 0.1 K/uL Final  . Immature Granulocytes 10/11/2019 1  % Final  . Abs Immature Granulocytes 10/11/2019 0.02  0.00 - 0.07 K/uL Final   Performed at [facility], [address], [place], [place] [address]  . Magnesium 10/11/2019 1.1* 1.7 - 2.4 mg/dL Final   Performed at [facility], [address] [place], [place] [address]    Assessment:  Sarah [name] is a 64 y.o. female with stage III IgA lambda light chain multiple myeloma s/p autologous stem cell  transplant in 06/14/2015 at the [facility] [place]. Bone marrow revealed 80% plasma cells. Lambda free light chains were 1340. She had nephrotic range proteinuria. She initially underwent induction with RVD. Revlimid maintenance was discontinued on 01/21/2017 secondary to intolerance.   Bone marrowaspirate andbiopsyon 01/18/2021revealed anormocellularmarrow withbut increased lambda-restricted plasma cells (9% aspirate, 40% CD138 immunohistochemistry).Findingswereconsistent with recurrent plasma cell myeloma.Flow cytometry revealed no monoclonal B-cell or phenotypically aberrant T-cell population. Cytogeneticswere 86, XX (normal). 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  M-spikehas been followed: 0 on 04/02/2016 -06/23/2019; and0.2 on 09/02/2017.  Lambda light chainshave been followed: 22.2 (ratio 0.56) on 07/03/2017, 30.8 (ratio 0.78) on 09/02/2017, 36.9 (ratio 0.40) on 10/21/2017, 37.4 (ratio 0.41) on 12/16/2017, 70.7(ratio 0.31) on 02/17/2018, 64.2 (ratio  0.27) on 04/07/2018, 78.9 (ratio 0.18) on 05/26/2018, 128.8 (ratio 0.17) on 08/06/2018, 181.5 (ratio 0.13) on 10/08/2018, 130.9 (ratio 0.13) on 10/20/2018, 160.7 (ratio 0.10)on 12/09/2018, 236.6 (ratio 0.07) on 02/01/2019, 363.6 (ratio 0.04) on 03/22/2019, 404.8 (ratio 0.04) on 04/05/2019, 420.7 (ratio 0.03) on 05/24/2019, 573.4 (ratio 0.03) on 06/23/2019, and451.05(ratio 0.02) on02/19/2021.  24 hour UPEPon 06/03/2019 revealed kappa free light chains95.76,lambda free light chains1,260.71, andratio 0.08.24 hourUPEPon 02/22/2021revealed totalproteinof719m/24 hrs withlambdafree light chains 1,084.124mL andratioof0.10(1.03-31.76). M spike inurinewas46.1%(36182m4 hrs).  Bone surveyon 04/08/2016 and11/28/2018 revealed no definite lytic lesion seen in the visualized skeleton.Bone surveyon 11/19/2018 revealed no suspicious lucent lesionsand no acute bony  abnormality.PET scanon 07/12/2019 revealed no focal metabolic activity to suggest active myeloma within the skeleton. There wereno lytic lesions identified on the CT portion of the examorsoft tissue plasmacytomas. There was no evidence of multiple myeloma.  Pretreatment RBC phenotype on 09/23/2019 was positive for C, e, DUFFY B, KIDD B, M, S, and s antigen; negative for c, E, KELL, DUFFY A, KIDD A, and N antigen.   She is day 15 of cycle #1 daratumumab and hyaluronidase-fihj, Pomalyst, and Decadron (DPd) (began 09/27/2019).   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 Zometa was discontinued in 01/2017. She has chronic nauseaon Phenergan.  She has B12 deficiency. B12 was 254 on 04/09/2017, 295 on 08/20/2018, and 391 on 10/08/2018. Shewasonoral B12.She received B12 monthly (last04/07/2019).Folate was 18.6 on 02/08/2019.  She has iron deficiency. Ferritin was 32 on 07/01/2019. She received Venoferon 07/15/2019 and 07/22/2019.  She received her second COVID-19vaccineon 10/09/2019.  Symptomatically, she remains fatigued.  She has chronic nausea relieved with Phenergan.  Neuropathy is stable.  She has had leg cramps.  Magnesium is 1.1.  Calcium is 8.6 (corrected 8.855).  Plan: 1.   Labs today: CBC with diff, CMP, Mg. 2. Stage III multiple myeloma Sheiss/pautologous stem cell transplant in 06/2015.  Shehas remainedoff maintenance Revlimid secondary to intolerance.  M spikewas0 on 06/23/2019. Lambda light chainswere451.05(ratio 0.02) on02/19/2021. PET scan on 07/12/2019 revealed no metabolic bone lesions. 24 hour UPEPwithfree light chains revealedlambda free light chains 1,260.71,andratio 0.08. Bone marrowon01/18/2021were c/wrecurrent plasma cell myeloma. 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 She began daratumumab + Pomalyst and dexamethasone (DPd) on 09/27/2019. She is s/p day 8 of cycle #1. Plan: Pomalidomide (Pomalyst) 4 mg po q day 1-21. Decadron 20 mg po day 1-2, day 8-9, 15-16, and 22-23 with cycle #1 and 2. Decadron 20 mg po on days 1-2 and 15-16 x 4 cycles (if needed). Daratumumab and hyaluronidase-fihj (Darzalez Faspro) 1800 mg SQ on days 1, 8, 15, and 22. She took her premedications today.   She is tolerating treatment fairly well without change in neuropathy.   Nausea is slightly worse, but managed with Phenergan.  Labs reviewed.  Begin day 15 of cycle #1 DPd.  Discuss symptom management.  She has antiemetics and pain medications at home to use on a prn bases.  Interventions are adequate.    3. Normocytic anemia Hematocrit28.9. Hemoglobin9.9. MCV 89.8.Platelets80,000today. Ferritin128 on 10/04/2019. TSH was normal on02/20/2020  Creatinine 1.38(CrCl38.5ml19mnute).  Etiology felt secondary to myeloma and initiation of treatment. Continue to monitor. 4. Hypomagnesemia, chronic Magnesium1.1today. Magnesium sulfate4 gmIV today.  RTCon 10/14/2019 for labs (Mg), and +/- IV Mg.  Etiology of cramps likely secondary to hypomagnesemia. 5. Chronic back pain Etiologynotsecondary to multiple myeloma. [facility]is [facility] and is on Fentanyl patches. 6. Grade II peripheral neuropathy Etiologyfelt secondary toprior chemotherapy. No  evidence of progression.  Continue to monitor closely on treatment. 7.Chronic nausea Patient is followed by Dr.  Vicente Males. Patient doing fairly well on Phenergan.  Other antiemetics have not worked.  Patient declined a granisetron patch secondary to interaction with Trazodone. 8.B12 deficiency B12 injections began on 02/15/2019 (last04/07/2019). Continue B12 monthly. Folate was18.6on 02/08/2019. Check folate annually. 9.   Day 15 of cycle #1 Darzalez Faspro today. 10.   RTC in 1 week for MD assessment, labs (CBC with diff, CMP, Mg), day 22 of cycle #1 Darzalez Faspro and IV Mg.  I discussed the assessment and treatment plan with the patient.  The patient was provided an opportunity to ask questions and all were answered.  The patient agreed with the plan and demonstrated an understanding of the instructions.  The patient was advised to call back if the symptoms worsen or if the condition fails to improve as anticipated.   Lequita Asal, MD, PhD    10/11/2019, 9:05 AM  I, Selena Batten, am acting as scribe for [employer]. Mike Gip, MD, PhD.  I, Melissa C. Mike Gip, MD, have reviewed the above documentation for accuracy and completeness, and I agree with the above.

## 2019-10-08 ENCOUNTER — Encounter: Payer: Self-pay | Admitting: Hematology and Oncology

## 2019-10-08 ENCOUNTER — Other Ambulatory Visit: Payer: Self-pay

## 2019-10-08 NOTE — Progress Notes (Signed)
RN called and verified pt name and DOB.  RN called for pre assessment of pt for visit on Monday April 12.  Pt reports increased weakness in legs in last week.

## 2019-10-10 ENCOUNTER — Other Ambulatory Visit: Payer: Self-pay | Admitting: Hematology and Oncology

## 2019-10-10 DIAGNOSIS — C9000 Multiple myeloma not having achieved remission: Secondary | ICD-10-CM

## 2019-10-11 ENCOUNTER — Inpatient Hospital Stay: Payer: Medicare PPO | Admitting: Hematology and Oncology

## 2019-10-11 ENCOUNTER — Telehealth: Payer: Self-pay | Admitting: *Deleted

## 2019-10-11 ENCOUNTER — Ambulatory Visit: Payer: Medicare PPO

## 2019-10-11 ENCOUNTER — Encounter: Payer: Self-pay | Admitting: Hematology and Oncology

## 2019-10-11 ENCOUNTER — Inpatient Hospital Stay: Payer: Medicare PPO

## 2019-10-11 ENCOUNTER — Other Ambulatory Visit: Payer: Self-pay

## 2019-10-11 VITALS — BP 101/63 | HR 72 | Resp 18

## 2019-10-11 VITALS — BP 95/61 | HR 73 | Temp 97.9°F | Resp 18 | Ht 62.5 in | Wt 152.0 lb

## 2019-10-11 DIAGNOSIS — M791 Myalgia, unspecified site: Secondary | ICD-10-CM | POA: Diagnosis not present

## 2019-10-11 DIAGNOSIS — I130 Hypertensive heart and chronic kidney disease with heart failure and stage 1 through stage 4 chronic kidney disease, or unspecified chronic kidney disease: Secondary | ICD-10-CM | POA: Diagnosis not present

## 2019-10-11 DIAGNOSIS — G620 Drug-induced polyneuropathy: Secondary | ICD-10-CM

## 2019-10-11 DIAGNOSIS — Z5112 Encounter for antineoplastic immunotherapy: Secondary | ICD-10-CM | POA: Diagnosis not present

## 2019-10-11 DIAGNOSIS — C9000 Multiple myeloma not having achieved remission: Secondary | ICD-10-CM

## 2019-10-11 DIAGNOSIS — E538 Deficiency of other specified B group vitamins: Secondary | ICD-10-CM

## 2019-10-11 DIAGNOSIS — R197 Diarrhea, unspecified: Secondary | ICD-10-CM | POA: Diagnosis not present

## 2019-10-11 DIAGNOSIS — T451X5A Adverse effect of antineoplastic and immunosuppressive drugs, initial encounter: Secondary | ICD-10-CM

## 2019-10-11 DIAGNOSIS — R0982 Postnasal drip: Secondary | ICD-10-CM | POA: Diagnosis not present

## 2019-10-11 DIAGNOSIS — D649 Anemia, unspecified: Secondary | ICD-10-CM

## 2019-10-11 DIAGNOSIS — R61 Generalized hyperhidrosis: Secondary | ICD-10-CM | POA: Diagnosis not present

## 2019-10-11 DIAGNOSIS — R110 Nausea: Secondary | ICD-10-CM | POA: Diagnosis not present

## 2019-10-11 LAB — CBC WITH DIFFERENTIAL/PLATELET
Abs Immature Granulocytes: 0.02 10*3/uL (ref 0.00–0.07)
Basophils Absolute: 0 10*3/uL (ref 0.0–0.1)
Basophils Relative: 1 %
Eosinophils Absolute: 0.3 10*3/uL (ref 0.0–0.5)
Eosinophils Relative: 7 %
HCT: 28.9 % — ABNORMAL LOW (ref 36.0–46.0)
Hemoglobin: 9.9 g/dL — ABNORMAL LOW (ref 12.0–15.0)
Immature Granulocytes: 1 %
Lymphocytes Relative: 11 %
Lymphs Abs: 0.4 10*3/uL — ABNORMAL LOW (ref 0.7–4.0)
MCH: 30.7 pg (ref 26.0–34.0)
MCHC: 34.3 g/dL (ref 30.0–36.0)
MCV: 89.8 fL (ref 80.0–100.0)
Monocytes Absolute: 0.3 10*3/uL (ref 0.1–1.0)
Monocytes Relative: 9 %
Neutro Abs: 2.4 10*3/uL (ref 1.7–7.7)
Neutrophils Relative %: 71 %
Platelets: 80 10*3/uL — ABNORMAL LOW (ref 150–400)
RBC: 3.22 MIL/uL — ABNORMAL LOW (ref 3.87–5.11)
RDW: 13.9 % (ref 11.5–15.5)
WBC: 3.4 10*3/uL — ABNORMAL LOW (ref 4.0–10.5)
nRBC: 0 % (ref 0.0–0.2)

## 2019-10-11 LAB — COMPREHENSIVE METABOLIC PANEL
ALT: 25 U/L (ref 0–44)
AST: 20 U/L (ref 15–41)
Albumin: 3.7 g/dL (ref 3.5–5.0)
Alkaline Phosphatase: 82 U/L (ref 38–126)
Anion gap: 10 (ref 5–15)
BUN: 35 mg/dL — ABNORMAL HIGH (ref 8–23)
CO2: 22 mmol/L (ref 22–32)
Calcium: 8.6 mg/dL — ABNORMAL LOW (ref 8.9–10.3)
Chloride: 103 mmol/L (ref 98–111)
Creatinine, Ser: 1.38 mg/dL — ABNORMAL HIGH (ref 0.44–1.00)
GFR calc Af Amer: 47 mL/min — ABNORMAL LOW (ref >60)
GFR calc non Af Amer: 41 mL/min — ABNORMAL LOW (ref >60)
Glucose, Bld: 165 mg/dL — ABNORMAL HIGH (ref 70–99)
Potassium: 4.3 mmol/L (ref 3.5–5.1)
Sodium: 135 mmol/L (ref 135–145)
Total Bilirubin: 0.4 mg/dL (ref 0.3–1.2)
Total Protein: 6.1 g/dL — ABNORMAL LOW (ref 6.5–8.1)

## 2019-10-11 LAB — MAGNESIUM: Magnesium: 1.1 mg/dL — ABNORMAL LOW (ref 1.7–2.4)

## 2019-10-11 MED ORDER — SODIUM CHLORIDE 0.9% FLUSH
10.0000 mL | INTRAVENOUS | Status: DC | PRN
  Administered 2019-10-11: 10 mL via INTRAVENOUS
  Filled 2019-10-11: qty 10

## 2019-10-11 MED ORDER — SODIUM CHLORIDE 0.9 % IV SOLN
Freq: Once | INTRAVENOUS | Status: AC
  Filled 2019-10-11: qty 250

## 2019-10-11 MED ORDER — DEXAMETHASONE 4 MG PO TABS
20.0000 mg | ORAL_TABLET | Freq: Once | ORAL | Status: AC
  Administered 2019-10-11: 20 mg via ORAL

## 2019-10-11 MED ORDER — HEPARIN SOD (PORK) LOCK FLUSH 100 UNIT/ML IV SOLN
500.0000 [IU] | Freq: Once | INTRAVENOUS | Status: AC
  Administered 2019-10-11: 500 [IU] via INTRAVENOUS
  Filled 2019-10-11: qty 5

## 2019-10-11 MED ORDER — DARATUMUMAB-HYALURONIDASE-FIHJ 1800-30000 MG-UT/15ML ~~LOC~~ SOLN
1800.0000 mg | Freq: Once | SUBCUTANEOUS | Status: AC
  Administered 2019-10-11: 1800 mg via SUBCUTANEOUS
  Filled 2019-10-11: qty 15

## 2019-10-11 MED ORDER — MAGNESIUM SULFATE 4 GM/100ML IV SOLN
4.0000 g | Freq: Once | INTRAVENOUS | Status: AC
  Administered 2019-10-11: 4 g via INTRAVENOUS

## 2019-10-11 NOTE — Progress Notes (Signed)
Patient reports that she had great energy last Monday and Tuesday but it faded by Friday.  She said that leg cramps started Wednesday and she took a muscle relaxer which helped.  She experienced upper back shoulder blade pain around Friday.  She has nausea all the time which is starting to interfere with eating because she is losing taste as well.  She has a sore throat all week which is worse in the mornings.  She did receive her second vaccine on Saturday.  She did take Tylenol, Benadryl, Allegra, and Singulair this morning.

## 2019-10-11 NOTE — Progress Notes (Signed)
Plt 80, ok to treat today

## 2019-10-11 NOTE — Progress Notes (Signed)
The patient c/o leg cramps x 5 days but she has been taking a muscle relaxer.upper shoulder blade pain x 3 days. The patient also c/o fatigued at the end of the week. She has been having nausea which interferes with eating. The patient also c/o sore throat all week, extreme in the morning.

## 2019-10-11 NOTE — Telephone Encounter (Signed)
Patient called to say her right hand was itchy and swollen.  Patient advised to wash her hand well, apply ice, or use a benadryl or hydrocortisone cream.  Patient did have Benadryl and Decadron for her injection today and will take another dose of decadron at home tomorrow.  Patient also advised to contact primary care MD if the condition worsens and to seek emergency care if she experiences any breathing difficulties or throat swelling.  Patient voiced understanding.

## 2019-10-13 ENCOUNTER — Telehealth: Payer: Self-pay | Admitting: *Deleted

## 2019-10-13 NOTE — Telephone Encounter (Signed)
Spoke with patient and identified with name and DOB.  Patient reports that her right hand is still itchy, blistered.  She is still taking Benadryl.  She reports a bruise developed on her abdomen from her injection.  Reminded patient that her platelets are low and the bruise can happen with injections.  She says that she did get her shingrix vaccine and she does not have a rash anywhere else other than her right hand.  Patient is recording her symptoms from her treatment and will bring her notes and treatment calendar so we can go over her schedule.  From the chart treatment plan notes:  daratumumab (Darzalex) 16mg /kg [id number] Cycle 1-2, followed by 16 mg/kg D1,15 Cycles 3-6, followed by 16 mg/kg D1 of Cycles 7+, dexamethasone 20mg  on days 2,9,16,23 of cycle 1- 2, 20mg  on D2,16 of cycle 3-6, 40mg  D8,22 of cycles 3-6 and [id number] of cycle 7+ Take Home Rx, pomalidomide Administrator) D1-21 Take Home Rx (Charlo [id number], [id number]

## 2019-10-14 ENCOUNTER — Other Ambulatory Visit: Payer: Self-pay

## 2019-10-14 ENCOUNTER — Inpatient Hospital Stay: Payer: Medicare PPO

## 2019-10-14 DIAGNOSIS — D709 Neutropenia, unspecified: Secondary | ICD-10-CM | POA: Diagnosis not present

## 2019-10-14 DIAGNOSIS — C9000 Multiple myeloma not having achieved remission: Secondary | ICD-10-CM

## 2019-10-14 LAB — MAGNESIUM: Magnesium: 1.4 mg/dL — ABNORMAL LOW (ref 1.7–2.4)

## 2019-10-14 MED ORDER — SODIUM CHLORIDE 0.9% FLUSH
10.0000 mL | INTRAVENOUS | Status: DC | PRN
  Administered 2019-10-14: 10 mL via INTRAVENOUS
  Filled 2019-10-14: qty 10

## 2019-10-14 MED ORDER — MAGNESIUM SULFATE 4 GM/100ML IV SOLN
4.0000 g | Freq: Once | INTRAVENOUS | Status: AC
  Administered 2019-10-14: 4 g via INTRAVENOUS

## 2019-10-14 MED ORDER — HEPARIN SOD (PORK) LOCK FLUSH 100 UNIT/ML IV SOLN
500.0000 [IU] | Freq: Once | INTRAVENOUS | Status: AC
  Administered 2019-10-14: 500 [IU] via INTRAVENOUS
  Filled 2019-10-14: qty 5

## 2019-10-14 MED ORDER — SODIUM CHLORIDE 0.9 % IV SOLN
Freq: Once | INTRAVENOUS | Status: AC
  Filled 2019-10-14: qty 250

## 2019-10-14 NOTE — Patient Instructions (Signed)
Magnesium Sulfate injection What is this medicine? MAGNESIUM SULFATE (mag NEE zee um SUL fate) is an electrolyte injection commonly used to treat low magnesium levels in your blood. It is also used to prevent or control seizures in women with preeclampsia or eclampsia. This medicine may be used for other purposes; ask your health care provider or pharmacist if you have questions. What should I tell my health care provider before I take this medicine? They need to know if you have any of these conditions:  heart disease  history of irregular heart beat  kidney disease  an unusual or allergic reaction to magnesium sulfate, medicines, foods, dyes, or preservatives  pregnant or trying to get pregnant  breast-feeding How should I use this medicine? This medicine is for infusion into a vein. It is given by a health care professional in a hospital or clinic setting. Talk to your pediatrician regarding the use of this medicine in children. While this drug may be prescribed for selected conditions, precautions do apply. Overdosage: If you think you have taken too much of this medicine contact a poison control center or emergency room at once. NOTE: This medicine is only for you. Do not share this medicine with others. What if I miss a dose? This does not apply. What may interact with this medicine? This medicine may interact with the following medications:  certain medicines for anxiety or sleep  certain medicines for seizures like phenobarbital  digoxin  medicines that relax muscles for surgery  narcotic medicines for pain This list may not describe all possible interactions. Give your health care provider a list of all the medicines, herbs, non-prescription drugs, or dietary supplements you use. Also tell them if you smoke, drink alcohol, or use illegal drugs. Some items may interact with your medicine. What should I watch for while using this medicine? Your condition will be  monitored carefully while you are receiving this medicine. You may need blood work done while you are receiving this medicine. What side effects may I notice from receiving this medicine? Side effects that you should report to your doctor or health care professional as soon as possible:  allergic reactions like skin rash, itching or hives, swelling of the face, lips, or tongue  facial flushing  muscle weakness  signs and symptoms of low blood pressure like dizziness; feeling faint or lightheaded, falls; unusually weak or tired  signs and symptoms of a dangerous change in heartbeat or heart rhythm like chest pain; dizziness; fast or irregular heartbeat; palpitations; breathing problems  sweating This list may not describe all possible side effects. Call your doctor for medical advice about side effects. You may report side effects to FDA at 1-800-FDA-1088. Where should I keep my medicine? This drug is given in a hospital or clinic and will not be stored at home. NOTE: This sheet is a summary. It may not cover all possible information. If you have questions about this medicine, talk to your doctor, pharmacist, or health care provider.  2020 Elsevier/Gold Standard (2016-01-03 12:31:42)  

## 2019-10-15 ENCOUNTER — Inpatient Hospital Stay: Payer: Medicare PPO | Admitting: *Deleted

## 2019-10-15 ENCOUNTER — Encounter: Payer: Self-pay | Admitting: Hematology and Oncology

## 2019-10-15 ENCOUNTER — Ambulatory Visit
Admission: RE | Admit: 2019-10-15 | Discharge: 2019-10-15 | Disposition: A | Discharge status: Home / Self Care | Payer: Medicare PPO | Source: Ambulatory Visit | Attending: Hematology and Oncology | Admitting: Hematology and Oncology

## 2019-10-15 ENCOUNTER — Other Ambulatory Visit: Payer: Self-pay

## 2019-10-15 ENCOUNTER — Inpatient Hospital Stay: Payer: Medicare PPO | Attending: Hematology and Oncology | Admitting: Oncology

## 2019-10-15 ENCOUNTER — Telehealth: Payer: Self-pay

## 2019-10-15 ENCOUNTER — Encounter: Payer: Self-pay | Admitting: Emergency Medicine

## 2019-10-15 ENCOUNTER — Emergency Department: Payer: Medicare PPO

## 2019-10-15 ENCOUNTER — Inpatient Hospital Stay (HOSPITAL_BASED_OUTPATIENT_CLINIC_OR_DEPARTMENT_OTHER): Payer: Medicare PPO | Admitting: Hematology and Oncology

## 2019-10-15 ENCOUNTER — Ambulatory Visit
Admission: RE | Admit: 2019-10-15 | Discharge: 2019-10-15 | Disposition: A | Discharge status: Home / Self Care | Payer: Medicare PPO | Source: Home / Self Care | Attending: Hematology and Oncology | Admitting: Hematology and Oncology

## 2019-10-15 ENCOUNTER — Inpatient Hospital Stay
Admission: EM | Admit: 2019-10-15 | Discharge: 2019-10-20 | DRG: 809 | Disposition: A | Discharge status: Home / Self Care | Payer: Medicare PPO | Attending: Internal Medicine | Admitting: Internal Medicine

## 2019-10-15 VITALS — BP 104/64 | HR 76 | Temp 100.1°F

## 2019-10-15 DIAGNOSIS — G8929 Other chronic pain: Secondary | ICD-10-CM | POA: Diagnosis not present

## 2019-10-15 DIAGNOSIS — Q231 Congenital insufficiency of aortic valve: Secondary | ICD-10-CM | POA: Diagnosis not present

## 2019-10-15 DIAGNOSIS — K1230 Oral mucositis (ulcerative), unspecified: Secondary | ICD-10-CM

## 2019-10-15 DIAGNOSIS — Z79899 Other long term (current) drug therapy: Secondary | ICD-10-CM

## 2019-10-15 DIAGNOSIS — R109 Unspecified abdominal pain: Secondary | ICD-10-CM

## 2019-10-15 DIAGNOSIS — N179 Acute kidney failure, unspecified: Secondary | ICD-10-CM | POA: Diagnosis not present

## 2019-10-15 DIAGNOSIS — R531 Weakness: Secondary | ICD-10-CM | POA: Insufficient documentation

## 2019-10-15 DIAGNOSIS — Z9221 Personal history of antineoplastic chemotherapy: Secondary | ICD-10-CM | POA: Diagnosis not present

## 2019-10-15 DIAGNOSIS — A419 Sepsis, unspecified organism: Secondary | ICD-10-CM

## 2019-10-15 DIAGNOSIS — D709 Neutropenia, unspecified: Secondary | ICD-10-CM | POA: Insufficient documentation

## 2019-10-15 DIAGNOSIS — E860 Dehydration: Secondary | ICD-10-CM

## 2019-10-15 DIAGNOSIS — M899 Disorder of bone, unspecified: Secondary | ICD-10-CM | POA: Insufficient documentation

## 2019-10-15 DIAGNOSIS — D701 Agranulocytosis secondary to cancer chemotherapy: Secondary | ICD-10-CM

## 2019-10-15 DIAGNOSIS — I10 Essential (primary) hypertension: Secondary | ICD-10-CM | POA: Diagnosis present

## 2019-10-15 DIAGNOSIS — E1140 Type 2 diabetes mellitus with diabetic neuropathy, unspecified: Secondary | ICD-10-CM | POA: Diagnosis not present

## 2019-10-15 DIAGNOSIS — E119 Type 2 diabetes mellitus without complications: Secondary | ICD-10-CM | POA: Diagnosis not present

## 2019-10-15 DIAGNOSIS — D696 Thrombocytopenia, unspecified: Secondary | ICD-10-CM | POA: Diagnosis not present

## 2019-10-15 DIAGNOSIS — F419 Anxiety disorder, unspecified: Secondary | ICD-10-CM | POA: Diagnosis not present

## 2019-10-15 DIAGNOSIS — I130 Hypertensive heart and chronic kidney disease with heart failure and stage 1 through stage 4 chronic kidney disease, or unspecified chronic kidney disease: Secondary | ICD-10-CM | POA: Diagnosis not present

## 2019-10-15 DIAGNOSIS — Z20822 Contact with and (suspected) exposure to covid-19: Secondary | ICD-10-CM | POA: Diagnosis present

## 2019-10-15 DIAGNOSIS — I050 Rheumatic mitral stenosis: Secondary | ICD-10-CM | POA: Diagnosis present

## 2019-10-15 DIAGNOSIS — Z7984 Long term (current) use of oral hypoglycemic drugs: Secondary | ICD-10-CM

## 2019-10-15 DIAGNOSIS — R197 Diarrhea, unspecified: Secondary | ICD-10-CM | POA: Diagnosis present

## 2019-10-15 DIAGNOSIS — R002 Palpitations: Secondary | ICD-10-CM | POA: Diagnosis present

## 2019-10-15 DIAGNOSIS — R21 Rash and other nonspecific skin eruption: Secondary | ICD-10-CM | POA: Diagnosis not present

## 2019-10-15 DIAGNOSIS — K5900 Constipation, unspecified: Secondary | ICD-10-CM | POA: Insufficient documentation

## 2019-10-15 DIAGNOSIS — M549 Dorsalgia, unspecified: Secondary | ICD-10-CM | POA: Diagnosis not present

## 2019-10-15 DIAGNOSIS — I350 Nonrheumatic aortic (valve) stenosis: Secondary | ICD-10-CM

## 2019-10-15 DIAGNOSIS — K529 Noninfective gastroenteritis and colitis, unspecified: Secondary | ICD-10-CM | POA: Diagnosis not present

## 2019-10-15 DIAGNOSIS — C9000 Multiple myeloma not having achieved remission: Secondary | ICD-10-CM

## 2019-10-15 DIAGNOSIS — R05 Cough: Secondary | ICD-10-CM | POA: Insufficient documentation

## 2019-10-15 DIAGNOSIS — N183 Chronic kidney disease, stage 3 unspecified: Secondary | ICD-10-CM | POA: Diagnosis not present

## 2019-10-15 DIAGNOSIS — R5081 Fever presenting with conditions classified elsewhere: Secondary | ICD-10-CM | POA: Diagnosis not present

## 2019-10-15 DIAGNOSIS — R509 Fever, unspecified: Secondary | ICD-10-CM | POA: Insufficient documentation

## 2019-10-15 DIAGNOSIS — Z833 Family history of diabetes mellitus: Secondary | ICD-10-CM

## 2019-10-15 DIAGNOSIS — E118 Type 2 diabetes mellitus with unspecified complications: Secondary | ICD-10-CM | POA: Diagnosis present

## 2019-10-15 DIAGNOSIS — I509 Heart failure, unspecified: Secondary | ICD-10-CM | POA: Diagnosis not present

## 2019-10-15 DIAGNOSIS — R011 Cardiac murmur, unspecified: Secondary | ICD-10-CM | POA: Diagnosis not present

## 2019-10-15 DIAGNOSIS — T451X5A Adverse effect of antineoplastic and immunosuppressive drugs, initial encounter: Secondary | ICD-10-CM

## 2019-10-15 DIAGNOSIS — K760 Fatty (change of) liver, not elsewhere classified: Secondary | ICD-10-CM | POA: Diagnosis not present

## 2019-10-15 DIAGNOSIS — Z7982 Long term (current) use of aspirin: Secondary | ICD-10-CM | POA: Diagnosis not present

## 2019-10-15 DIAGNOSIS — Z9481 Bone marrow transplant status: Secondary | ICD-10-CM | POA: Diagnosis not present

## 2019-10-15 DIAGNOSIS — Z299 Encounter for prophylactic measures, unspecified: Secondary | ICD-10-CM

## 2019-10-15 DIAGNOSIS — C9001 Multiple myeloma in remission: Secondary | ICD-10-CM | POA: Diagnosis present

## 2019-10-15 DIAGNOSIS — R809 Proteinuria, unspecified: Secondary | ICD-10-CM | POA: Insufficient documentation

## 2019-10-15 DIAGNOSIS — N289 Disorder of kidney and ureter, unspecified: Secondary | ICD-10-CM

## 2019-10-15 DIAGNOSIS — D649 Anemia, unspecified: Secondary | ICD-10-CM | POA: Diagnosis not present

## 2019-10-15 DIAGNOSIS — D61818 Other pancytopenia: Secondary | ICD-10-CM | POA: Diagnosis not present

## 2019-10-15 DIAGNOSIS — C9002 Multiple myeloma in relapse: Secondary | ICD-10-CM | POA: Diagnosis not present

## 2019-10-15 DIAGNOSIS — I5032 Chronic diastolic (congestive) heart failure: Secondary | ICD-10-CM | POA: Diagnosis not present

## 2019-10-15 DIAGNOSIS — E1122 Type 2 diabetes mellitus with diabetic chronic kidney disease: Secondary | ICD-10-CM | POA: Diagnosis present

## 2019-10-15 DIAGNOSIS — F329 Major depressive disorder, single episode, unspecified: Secondary | ICD-10-CM | POA: Diagnosis not present

## 2019-10-15 DIAGNOSIS — J312 Chronic pharyngitis: Secondary | ICD-10-CM | POA: Insufficient documentation

## 2019-10-15 LAB — CBC WITH DIFFERENTIAL/PLATELET
Abs Immature Granulocytes: 0 10*3/uL (ref 0.00–0.07)
Abs Immature Granulocytes: 0 10*3/uL (ref 0.00–0.07)
Band Neutrophils: 5 %
Basophils Absolute: 0 10*3/uL (ref 0.0–0.1)
Basophils Absolute: 0 10*3/uL (ref 0.0–0.1)
Basophils Relative: 0 %
Basophils Relative: 1 %
Eosinophils Absolute: 0.3 10*3/uL (ref 0.0–0.5)
Eosinophils Absolute: 0.2 10*3/uL (ref 0.0–0.5)
Eosinophils Relative: 15 %
Eosinophils Relative: 12 %
HCT: 28.8 % — ABNORMAL LOW (ref 36.0–46.0)
HCT: 29.1 % — ABNORMAL LOW (ref 36.0–46.0)
Hemoglobin: 10 g/dL — ABNORMAL LOW (ref 12.0–15.0)
Hemoglobin: 10.2 g/dL — ABNORMAL LOW (ref 12.0–15.0)
Immature Granulocytes: 0 %
Lymphocytes Relative: 30 %
Lymphocytes Relative: 37 %
Lymphs Abs: 0.5 10*3/uL — ABNORMAL LOW (ref 0.7–4.0)
Lymphs Abs: 0.6 10*3/uL — ABNORMAL LOW (ref 0.7–4.0)
MCH: 31 pg (ref 26.0–34.0)
MCH: 31.1 pg (ref 26.0–34.0)
MCHC: 34.7 g/dL (ref 30.0–36.0)
MCHC: 35.1 g/dL (ref 30.0–36.0)
MCV: 89.2 fL (ref 80.0–100.0)
MCV: 88.7 fL (ref 80.0–100.0)
Monocytes Absolute: 0.2 10*3/uL (ref 0.1–1.0)
Monocytes Absolute: 0.3 10*3/uL (ref 0.1–1.0)
Monocytes Relative: 10 %
Monocytes Relative: 16 %
Neutro Abs: 0.8 10*3/uL — ABNORMAL LOW (ref 1.7–7.7)
Neutro Abs: 0.6 10*3/uL — ABNORMAL LOW (ref 1.7–7.7)
Neutrophils Relative %: 40 %
Neutrophils Relative %: 34 %
Platelets: 86 10*3/uL — ABNORMAL LOW (ref 150–400)
Platelets: 91 10*3/uL — ABNORMAL LOW (ref 150–400)
RBC Morphology: NONE SEEN
RBC: 3.23 MIL/uL — ABNORMAL LOW (ref 3.87–5.11)
RBC: 3.28 MIL/uL — ABNORMAL LOW (ref 3.87–5.11)
RDW: 14 % (ref 11.5–15.5)
RDW: 13.9 % (ref 11.5–15.5)
Smear Review: DECREASED
WBC: 1.8 10*3/uL — ABNORMAL LOW (ref 4.0–10.5)
WBC: 1.7 10*3/uL — ABNORMAL LOW (ref 4.0–10.5)
nRBC: 0 % (ref 0.0–0.2)
nRBC: 0 % (ref 0.0–0.2)

## 2019-10-15 LAB — COMPREHENSIVE METABOLIC PANEL
ALT: 29 U/L (ref 0–44)
ALT: 30 U/L (ref 0–44)
AST: 17 U/L (ref 15–41)
AST: 16 U/L (ref 15–41)
Albumin: 3.8 g/dL (ref 3.5–5.0)
Albumin: 3.9 g/dL (ref 3.5–5.0)
Alkaline Phosphatase: 76 U/L (ref 38–126)
Alkaline Phosphatase: 70 U/L (ref 38–126)
Anion gap: 8 (ref 5–15)
Anion gap: 8 (ref 5–15)
BUN: 52 mg/dL — ABNORMAL HIGH (ref 8–23)
BUN: 48 mg/dL — ABNORMAL HIGH (ref 8–23)
CO2: 22 mmol/L (ref 22–32)
CO2: 23 mmol/L (ref 22–32)
Calcium: 8.1 mg/dL — ABNORMAL LOW (ref 8.9–10.3)
Calcium: 8.3 mg/dL — ABNORMAL LOW (ref 8.9–10.3)
Chloride: 102 mmol/L (ref 98–111)
Chloride: 103 mmol/L (ref 98–111)
Creatinine, Ser: 1.6 mg/dL — ABNORMAL HIGH (ref 0.44–1.00)
Creatinine, Ser: 1.63 mg/dL — ABNORMAL HIGH (ref 0.44–1.00)
GFR calc Af Amer: 39 mL/min — ABNORMAL LOW (ref >60)
GFR calc Af Amer: 38 mL/min — ABNORMAL LOW (ref >60)
GFR calc non Af Amer: 34 mL/min — ABNORMAL LOW (ref >60)
GFR calc non Af Amer: 33 mL/min — ABNORMAL LOW (ref >60)
Glucose, Bld: 107 mg/dL — ABNORMAL HIGH (ref 70–99)
Glucose, Bld: 111 mg/dL — ABNORMAL HIGH (ref 70–99)
Potassium: 4.2 mmol/L (ref 3.5–5.1)
Potassium: 4.2 mmol/L (ref 3.5–5.1)
Sodium: 132 mmol/L — ABNORMAL LOW (ref 135–145)
Sodium: 134 mmol/L — ABNORMAL LOW (ref 135–145)
Total Bilirubin: 0.5 mg/dL (ref 0.3–1.2)
Total Bilirubin: 0.8 mg/dL (ref 0.3–1.2)
Total Protein: 6.5 g/dL (ref 6.5–8.1)
Total Protein: 6.4 g/dL — ABNORMAL LOW (ref 6.5–8.1)

## 2019-10-15 LAB — URINALYSIS, COMPLETE (UACMP) WITH MICROSCOPIC
Bilirubin Urine: NEGATIVE
Glucose, UA: NEGATIVE mg/dL
Hgb urine dipstick: NEGATIVE
Ketones, ur: NEGATIVE mg/dL
Leukocytes,Ua: NEGATIVE
Nitrite: NEGATIVE
Specific Gravity, Urine: 1.02 (ref 1.005–1.030)
pH: 5 (ref 5.0–8.0)

## 2019-10-15 LAB — LACTIC ACID, PLASMA
Lactic Acid, Venous: 1 mmol/L (ref 0.5–1.9)
Lactic Acid, Venous: 0.9 mmol/L (ref 0.5–1.9)

## 2019-10-15 LAB — MAGNESIUM: Magnesium: 2.2 mg/dL (ref 1.7–2.4)

## 2019-10-15 LAB — PROCALCITONIN: Procalcitonin: 0.24 ng/mL

## 2019-10-15 MED ORDER — HEPARIN SOD (PORK) LOCK FLUSH 100 UNIT/ML IV SOLN
500.0000 [IU] | Freq: Once | INTRAVENOUS | Status: AC
  Administered 2019-10-15: 500 [IU] via INTRAVENOUS
  Filled 2019-10-15: qty 5

## 2019-10-15 MED ORDER — ENOXAPARIN SODIUM 40 MG/0.4ML ~~LOC~~ SOLN: 40.0000 mg | SUBCUTANEOUS | Status: DC

## 2019-10-15 MED ORDER — PROMETHAZINE HCL 25 MG PO TABS
12.5000 mg | ORAL_TABLET | Freq: Four times a day (QID) | ORAL | Status: DC | PRN
  Administered 2019-10-16: 12.5 mg via ORAL
  Filled 2019-10-15 (×3): qty 1

## 2019-10-15 MED ORDER — SODIUM CHLORIDE 0.9 % IV BOLUS (SEPSIS)
1000.0000 mL | Freq: Once | INTRAVENOUS | Status: AC
  Administered 2019-10-15: 1000 mL via INTRAVENOUS

## 2019-10-15 MED ORDER — INSULIN ASPART 100 UNIT/ML ~~LOC~~ SOLN
0.0000 [IU] | Freq: Three times a day (TID) | SUBCUTANEOUS | Status: DC
  Administered 2019-10-16: 1 [IU] via SUBCUTANEOUS
  Filled 2019-10-15: qty 1

## 2019-10-15 MED ORDER — SODIUM CHLORIDE 0.9 % IV BOLUS (SEPSIS)
1000.0000 mL | Freq: Once | INTRAVENOUS | Status: AC
  Administered 2019-10-15: 23:00:00 1000 mL via INTRAVENOUS

## 2019-10-15 MED ORDER — IBUPROFEN 400 MG PO TABS
400.0000 mg | ORAL_TABLET | Freq: Four times a day (QID) | ORAL | Status: DC | PRN
  Administered 2019-10-16 (×2): 400 mg via ORAL
  Filled 2019-10-15 (×2): qty 1

## 2019-10-15 MED ORDER — SODIUM CHLORIDE 0.9% FLUSH
3.0000 mL | Freq: Once | INTRAVENOUS | Status: AC
  Administered 2019-10-15: 23:00:00 3 mL via INTRAVENOUS

## 2019-10-15 MED ORDER — PROMETHAZINE HCL 25 MG/ML IJ SOLN
12.5000 mg | Freq: Once | INTRAMUSCULAR | Status: AC
  Administered 2019-10-15: 23:00:00 12.5 mg via INTRAVENOUS
  Filled 2019-10-15: qty 1

## 2019-10-15 MED ORDER — SODIUM CHLORIDE 0.9 % IV BOLUS (SEPSIS)
250.0000 mL | Freq: Once | INTRAVENOUS | Status: AC
  Administered 2019-10-15: 250 mL via INTRAVENOUS

## 2019-10-15 MED ORDER — VANCOMYCIN HCL IN DEXTROSE 1-5 GM/200ML-% IV SOLN
1000.0000 mg | Freq: Once | INTRAVENOUS | Status: AC
  Administered 2019-10-16: 1000 mg via INTRAVENOUS
  Filled 2019-10-15: qty 200

## 2019-10-15 MED ORDER — SODIUM CHLORIDE 0.9 % IV SOLN
2.0000 g | Freq: Once | INTRAVENOUS | Status: AC
  Administered 2019-10-15: 2 g via INTRAVENOUS
  Filled 2019-10-15: qty 2

## 2019-10-15 MED ORDER — SODIUM CHLORIDE 0.9 % IV SOLN
2.0000 g | Freq: Two times a day (BID) | INTRAVENOUS | Status: DC
  Administered 2019-10-16 – 2019-10-19 (×7): 2 g via INTRAVENOUS
  Filled 2019-10-15 (×8): qty 2

## 2019-10-15 MED ORDER — SODIUM CHLORIDE 0.9 % IV SOLN: INTRAVENOUS | Status: AC

## 2019-10-15 MED ORDER — SODIUM CHLORIDE 0.9% FLUSH
10.0000 mL | INTRAVENOUS | Status: DC | PRN
  Administered 2019-10-15: 10 mL via INTRAVENOUS
  Filled 2019-10-15: qty 10

## 2019-10-15 MED ORDER — METRONIDAZOLE IN NACL 5-0.79 MG/ML-% IV SOLN
500.0000 mg | Freq: Once | INTRAVENOUS | Status: AC
  Administered 2019-10-15: 500 mg via INTRAVENOUS
  Filled 2019-10-15: qty 100

## 2019-10-15 MED ORDER — SODIUM CHLORIDE 0.9 % IV SOLN: 2.0000 g | Freq: Three times a day (TID) | INTRAVENOUS | Status: DC

## 2019-10-15 NOTE — Progress Notes (Unsigned)
The patient reports she has a  rash on her right hand and stomach after the darzalex injection on Monday she is having constipation to diarrhea and vomiting twice last night with a fever of 101.3 last night and still running a low grade fever 99.6 with decrease appitite. I have informed the [facility] team and DR Mike Gip . The patient Name and DOB has been verified by phone today.

## 2019-10-15 NOTE — ED Notes (Signed)
Placed purewick on pt. Pt tolerated well.

## 2019-10-15 NOTE — ED Notes (Signed)
ED Provider at bedside.  Pt reports 2nd recent dx of multiple myeloma   Pt c/o of chronic abdominal pain, requests phenergan for nausea,

## 2019-10-15 NOTE — Progress Notes (Signed)
Patient was seen virtually in [facility] and then sent to the [facility] for [place].  Temp was 97.3 tympanic but 100.1 orally.  BP 104/64, HR 76, 99% RA.  Patient states that around 6pm last night she went to bed early.  She had taken lactulose for constipation on Tuesday.  She states that she had nausea and vomiting last night as well as uncontrolled diarrhea.  She is wearing a depends undergarment.  MD was able to assess patient, labs were drawn, blood cultures drawn, and patient is getting a chest xray.  Port accessed for labs and left accessed.  MD was made aware of labs and patient will be admitted.  Patient verbalized understanding and is having her sister take her to the Emergency Room.

## 2019-10-15 NOTE — ED Notes (Signed)
Phone call from sister, Thayer Headings, updated for POC

## 2019-10-15 NOTE — Progress Notes (Signed)
[facility]  [address] [place], [place] [address] Phone: [phone]  Fax: [phone]   Clinic Day:  10/15/2019  Referring physician: Glean Hess, MD  Chief Complaint: Sarah [name] is a 64 y.o. 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   HPI: The patient was last seen in the medical oncology clinic on 10/11/2019. At that time, she remained fatigued.  She had chronic nausea relieved with Phenergan.  Neuropathy was stable.  She had had leg cramps.  Magnesium was 1.1. Calcium was 8.6 (corrected 8.855). Hematocrit 28.9, hemoglobin 9.9, MCV 89.8, platelets 80,000, WBC 3400, ANC 2400.  She received day 15 of cycle #1 daratumumab.  She notes a rash (blisters) on her hands earlier in the week that resolved.  She took lactulose on 10/12/2019 for constipation.  She developed profuse diarrhea yesterday (10/14/2019) that has continued.  She denies any blood or mucus. She developed a fever last night up to 101.3 with associated chills.  She did not call the on call physician.  She has continued to have a fever.  She notes a sore throat (chronic).  Nausea continues.  She has a slight cough.  She denies any dysuria.  Appetite is poor.  She is drinking fluids and voiding.   Past Medical History:  Diagnosis Date   Abnormal stress test 02/14/2016   Overview:  Added automatically from request for surgery [id number]   Anemia    Anxiety    Arthritis    Bicuspid aortic valve    CHF (congestive heart failure) (HCC)    CKD (chronic kidney disease) stage 3, GFR 30-59 ml/min    Depression    Diabetes mellitus (HCC)    Dizziness    Fatty liver    Frequent falls    GERD (gastroesophageal reflux disease)    Gout    Heart murmur    History of blood transfusion    History of bone marrow  transplant ([place])    History of uterine fibroid    Hx of cardiac catheterization 06/05/2016   Overview:  Normal coronaries 2017   Hypertension    Hypomagnesemia    Multiple myeloma ([place])    Personal history of chemotherapy    Renal cyst     Past Surgical History:  Procedure Laterality Date   ABDOMINAL HYSTERECTOMY     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   CARDIAC ELECTROPHYSIOLOGY MAPPING AND ABLATION     CARPAL TUNNEL RELEASE Bilateral    CHOLECYSTECTOMY  2008   COLONOSCOPY WITH PROPOFOL N/A 05/07/2017   Procedure: COLONOSCOPY WITH PROPOFOL;  Surgeon: [name], MD;  Location: [facility] ENDOSCOPY;  Service: Gastroenterology;  Laterality: N/A;   ESOPHAGOGASTRODUODENOSCOPY (EGD) WITH PROPOFOL N/A 05/07/2017   Procedure: ESOPHAGOGASTRODUODENOSCOPY (EGD) WITH PROPOFOL;  Surgeon: [name], MD;  Location: [facility] ENDOSCOPY;  Service: Gastroenterology;  Laterality: N/A;   FOOT SURGERY Bilateral    INCONTINENCE SURGERY  2009   INTERSTIM IMPLANT PLACEMENT     other     over active bladder   OTHER SURGICAL HISTORY     bladder stimulator    PARTIAL HYSTERECTOMY  03/1996   fibroids   PORTA CATH INSERTION N/A 03/10/2019   Procedure: PORTA CATH INSERTION;  Surgeon: Algernon Huxley, MD;  Location: [place] CV LAB;  Service: Cardiovascular;  Laterality: N/A;   TONSILLECTOMY  2007  Family History  Problem Relation Age of Onset   Colon cancer Father    Renal Disease Father    Diabetes Mellitus II Father    Melanoma Paternal Grandmother    Breast cancer Maternal Aunt 39   Anemia Mother    Heart disease Mother    Heart failure Mother    Renal Disease Mother    Congestive Heart Failure Mother    Heart disease Maternal Uncle    Throat cancer Maternal Uncle    Lung cancer Maternal Uncle    Liver disease Maternal Uncle    Heart failure Maternal Uncle    Hearing loss Son 65       Suicide     Social History:  reports that she quit smoking about 28 years  ago. Her smoking use included cigarettes. She has a 20.00 pack-year smoking history. She has never used smokeless tobacco. She reports previous alcohol use. She reports that she does not use drugs. Patient has not had ETOH in several months. She is on disability. She notes exposure to perchloroethylene [facility]).She lives much of her adult life in [place]. She was married with 2 sons. Her husband passed away. Her 2 sons took their own lives (age 31 and 25). She worked in [employer] in [employer]. She went back to school and earned an [employer] and works for [employer] for several years.[place] sistersin [place]. The patient is alone today.   Allergies:  Allergies  Allergen Reactions   Oxycodone-Acetaminophen Anaphylaxis    Swelling and rash   Celebrex [Celecoxib] Diarrhea   Codeine    Benadryl [Diphenhydramine] Palpitations   Morphine Itching and Rash   Ondansetron Diarrhea   Tylenol [Acetaminophen] Itching and Rash    Current Medications: Current Outpatient Medications  Medication Sig Dispense Refill   acetaminophen (TYLENOL) 500 MG tablet Take 500 mg by mouth every 6 (six) hours as needed.     acyclovir (ZOVIRAX) 400 MG tablet Take 1 tablet (400 mg total) by mouth 2 (two) times daily. 60 tablet 11   allopurinol (ZYLOPRIM) 100 MG tablet TAKE 1 TABLET(100 MG) BY MOUTH DAILY as needed 90 tablet 1   aspirin 81 MG chewable tablet Chew 1 tablet (81 mg total) by mouth daily. 30 tablet 0   bisoprolol (ZEBETA) 10 MG tablet TAKE 1 TABLET(10 MG) BY MOUTH DAILY 90 tablet 0   dexamethasone (DECADRON) 4 MG tablet Take 5 tablets (7m) on days 2, 9, 16, 23 of cycle 1 and 2 Take 5 tablets (255m on days 2, 16 of cycle 3,4,5 and 6 Take 5 tablets (2067mon days 2 of cycles 7 and beyond 120 tablet 0   diclofenac sodium (VOLTAREN) 1 % GEL Apply 2 g topically 4 (four) times daily. 100 g 1   diphenhydrAMINE (BENADRYL) 50 MG capsule Take 50 mg by mouth as  needed.     diphenoxylate-atropine (LOMOTIL) 2.5-0.025 MG tablet Take 2 tablets by mouth daily as needed for diarrhea or loose stools. 45 tablet 2   fentaNYL (DURAGESIC - DOSED MCG/HR) 25 MCG/HR patch Place 25 mcg onto the skin every 3 (three) days.      fexofenadine (ALLEGRA) 180 MG tablet TAKE 1 TABLET(180 MG) BY MOUTH DAILY 30 tablet 5   FLUoxetine (PROZAC) 40 MG capsule TAKE 1 CAPSULE EVERY DAY 90 capsule 1   fluticasone (FLONASE) 50 MCG/ACT nasal spray Place 2 sprays into both nostrils daily. 16 g 2   furosemide (LASIX) 20 MG tablet Take 20 mg by  mouth as needed.      glucose blood (ONE TOUCH ULTRA TEST) test strip      hydrOXYzine (ATARAX/VISTARIL) 10 MG tablet TAKE 1 TABLET(10 MG) BY MOUTH THREE TIMES DAILY AS NEEDED 90 tablet 0   lidocaine-prilocaine (EMLA) cream APPLY TOPICALLY AS NEEDED. 30 g 0   lisinopril (PRINIVIL,ZESTRIL) 10 MG tablet Take 10 mg by mouth daily.     Magnesium Bisglycinate (MAG GLYCINATE) 100 MG TABS Take 100 mg by mouth 2 (two) times daily. 60 tablet 0   metFORMIN (GLUCOPHAGE-XR) 500 MG 24 hr tablet Take 1 tablet (500 mg total) by mouth daily with breakfast. 90 tablet 1   Misc Natural Products (OSTEO BI-FLEX ADV TRIPLE ST PO) Take 2 tablets by mouth daily.     montelukast (SINGULAIR) 10 MG tablet Take 1 tablet by mouth on the day before treatment and for 2 days after treatment. 30 tablet 0   Multiple Vitamins-Minerals (HAIR/SKIN/NAILS/BIOTIN PO) Take 1 capsule by mouth 3 (three) times daily.     NARCAN 4 MG/0.1ML LIQD nasal spray kit 1 spray.     NON FORMULARY      Omega 3-6-9 Fatty Acids (OMEGA 3-6-9 COMPLEX) CAPS Take by mouth.     omeprazole (PRILOSEC) 40 MG capsule Take 1 capsule (40 mg total) by mouth daily. 90 capsule 1   POMALYST 4 MG capsule TAKE 1 CAPSULE (4MG TOTAL) BY MOUTH EVERY DAY FOR 21 DAYS, THEN HOLD FOR 7 DAYS. REPEAT EVERY 28 DAYS 21 capsule 0   pravastatin (PRAVACHOL) 20 MG tablet TAKE 1 TABLET EVERY DAY 90 tablet 1    promethazine (PHENERGAN) 25 MG tablet Take 1 tablet (25 mg total) by mouth 2 (two) times daily as needed for nausea or vomiting. (Patient not taking: Reported on 10/15/2019) 60 tablet 0   promethazine-dextromethorphan (PROMETHAZINE-DM) 6.25-15 MG/5ML syrup Take 5 mLs by mouth 4 (four) times daily as needed for cough. (Patient not taking: Reported on 09/22/2019) 240 mL 0   tiZANidine (ZANAFLEX) 4 MG tablet Take 1 tablet (4 mg total) by mouth every 8 (eight) hours as needed for muscle spasms. 270 tablet 1   traZODone (DESYREL) 100 MG tablet TAKE 1 TABLET AT BEDTIME  FOR  SLEEP 90 tablet 1   No current facility-administered medications for this visit.    Review of Systems  Constitutional: Positive for chills, fever and malaise/fatigue (majority of the time). Negative for diaphoresis and weight loss.       Feels "bad".  HENT: Positive for sore throat. Negative for congestion, ear discharge, ear pain, hearing loss, nosebleeds and sinus pain.   Eyes: Negative.  Negative for blurred vision, double vision and photophobia.  Respiratory: Positive for cough (slight). Negative for hemoptysis, sputum production and shortness of breath.   Cardiovascular: Negative.  Negative for chest pain, palpitations, orthopnea and leg swelling.  Gastrointestinal: Positive for diarrhea (watery) and nausea (chronic s/p gallbladder surgery; worsening). Negative for abdominal pain, blood in stool, constipation, heartburn (controlled on medication), melena and vomiting.       Not eating well. Drinking fluids.  Genitourinary: Negative for dysuria, frequency, hematuria and urgency.       Bladder leakage and spasms.  Musculoskeletal: Positive for back pain (chronic), joint pain (arthritis) and myalgias (leg cramps). Negative for neck pain.  Skin: Negative.  Negative for itching and rash.       Blisters on right hand, resolving  Neurological: Positive for sensory change (chronic numbness in fingers and toes). Negative for  dizziness, tremors, speech change, focal  weakness, weakness and headaches.  Endo/Heme/Allergies: Does not bruise/bleed easily.       Diabetes.  Psychiatric/Behavioral: Negative.  Negative for depression and memory loss. The patient is not nervous/anxious and does not have insomnia.   All other systems reviewed and are negative.  Performance status (ECOG):  1-2  Vitals Blood pressure 104/64, pulse 76, temperature 100.1 F (37.8 C), temperature source Oral, SpO2 99 %.   Physical Exam  Constitutional: She is oriented to person, place, and time. She appears well-developed and well-nourished. No distress.  Fatigued appearing woman sitting in the infusion center in no acute distress.  HENT:  Head: Normocephalic and atraumatic.  Mouth/Throat: Oropharynx is clear and moist. No oropharyngeal exudate.  Curly dark blonde. Dentures.  Soft palate slightly ruddy without petechiae.  Mask.  Eyes: Pupils are equal, round, and reactive to light. Conjunctivae and EOM are normal. No scleral icterus.  Glasses. Brown eyes.  Neck: No JVD present.  Cardiovascular: Normal rate, regular rhythm and normal heart sounds.  No murmur heard. Pulmonary/Chest: Effort normal and breath sounds normal. No respiratory distress. She has no wheezes. She has no rales. She exhibits no tenderness.  Abdominal: Soft. Bowel sounds are normal. She exhibits no distension and no mass. There is no abdominal tenderness. There is no rebound and no guarding.  Musculoskeletal:        General: No tenderness or edema. Normal range of motion.     Cervical back: Normal range of motion and neck supple.  Lymphadenopathy:       Head (right side): No preauricular, no posterior auricular and no occipital adenopathy present.       Head (left side): No preauricular, no posterior auricular and no occipital adenopathy present.    She has no cervical adenopathy.    She has no axillary adenopathy.       Right: No supraclavicular adenopathy present.         Left: No supraclavicular adenopathy present.  Neurological: She is alert and oriented to person, place, and time.  Skin: Skin is warm and dry. She is not diaphoretic. No erythema. There is pallor.  Dry crusted areas in web between fingers of right hand. Ecchymosis on abdomen at site of prior injections.   Psychiatric: She has a normal mood and affect. Her behavior is normal. Judgment and thought content normal.  Nursing note and vitals reviewed.   Infusion on 10/14/2019  Component Date Value Ref Range Status   Magnesium 10/14/2019 1.4* 1.7 - 2.4 mg/dL Final   Performed at [facility], [address] [place], [place] [address]    Assessment:  Sarah [name] is a 64 y.o. 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 Bone marrow revealed 80% plasma cells. Lambda free light chains were 1340. She had nephrotic range proteinuria. She initially underwent induction with RVD. Revlimid maintenance was discontinued on 01/21/2017 secondary to intolerance.   Bone marrowaspirate andbiopsyon 01/18/2021revealed anormocellularmarrow withbut increased lambda-restricted plasma cells (9% aspirate, 40% CD138 immunohistochemistry).Findingswereconsistent with recurrent plasma cell myeloma.Flow cytometry revealed no monoclonal B-cell or phenotypically aberrant T-cell population. Cytogeneticswere 73, XX (normal). 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  M-spikehas been followed: 0 on 04/02/2016 -06/23/2019; and0.2 on 09/02/2017.  Lambda light chainshave been followed: 22.2 (ratio 0.56) on 07/03/2017, 30.8 (ratio 0.78) on 09/02/2017, 36.9 (ratio 0.40) on 10/21/2017, 37.4 (ratio 0.41) on 12/16/2017, 70.7(ratio 0.31) on 02/17/2018, 64.2 (ratio 0.27) on 04/07/2018,  78.9 (ratio 0.18) on 05/26/2018, 128.8 (ratio 0.17) on 08/06/2018, 181.5 (ratio 0.13) on  10/08/2018, 130.9 (ratio 0.13) on 10/20/2018, 160.7 (ratio 0.10)on 12/09/2018, 236.6 (ratio 0.07) on 02/01/2019, 363.6 (ratio 0.04) on 03/22/2019, 404.8 (ratio 0.04) on 04/05/2019, 420.7 (ratio 0.03) on 05/24/2019, 573.4 (ratio 0.03) on 06/23/2019, and451.05(ratio 0.02) on02/19/2021.  24 hour UPEPon 06/03/2019 revealed kappa free light chains95.76,lambda free light chains1,260.71, andratio 0.08.24 hourUPEPon 02/22/2021revealed totalproteinof725m/24 hrs withlambdafree light chains 1,084.159mL andratioof0.10(1.03-31.76). M spike inurinewas46.1%(3614m4 hrs).  Bone surveyon 04/08/2016 and11/28/2018 revealed no definite lytic lesion seen in the visualized skeleton.Bone surveyon 11/19/2018 revealed no suspicious lucent lesionsand no acute bony abnormality.PET scanon 07/12/2019 revealed no focal metabolic activity to suggest active myeloma within the skeleton. There wereno lytic lesions identified on the CT portion of the examorsoft tissue plasmacytomas. There was no evidence of multiple myeloma.  Pretreatment RBC phenotype on 09/23/2019 was positive for C, e, DUFFY B, KIDD B, M, S, and s antigen; negative for c, E, KELL, DUFFY A, KIDD A, and N antigen.   She is day 19 of cycle #1 daratumumab and hyaluronidase-fihj, Pomalyst, and Decadron (DPd) (began 09/27/2019).   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 Zometa was discontinued in 01/2017. She has chronic nauseaon Phenergan.  She has B12 deficiency. B12 was 254 on 04/09/2017, 295 on 08/20/2018, and 391 on 10/08/2018. Shewasonoral B12.She received B12 monthly (last04/07/2019).Folate was 18.6 on 02/08/2019.  She has iron deficiency. Ferritin was 32 on 07/01/2019. She received Venoferon 07/15/2019 and 07/22/2019.  She received her second COVID-19vaccineon 10/09/2019.  Symptomatically,  She has fever and chills.  Temperature is 100.1.  She has profuse  diarrhea since lactulose on 10/12/2019.  She has a slight cough.  Plan: 1.   Labs today: CBC with diff, CMP, Mg, lactic acid. 2.   Stage III multiple myeloma She is day 19 of cycle #1 daratumumab + Pomalyst and dexamethasone (DPd).   Await CBC today. 3. Fever and chills Fever work-up with blood cultures x2, UA and culture, and CXR.  No obvious source.  Await labs and cultures  Concern for possible fever and neutropenia. 4. Hypomagnesemia, chronic Magnesium pending.  Patient receives IV magnesium 1-2 x/week. 5.   Diarrhea  Etiology unclear, but possibly related to lactulose taken earlier in the week.  Check stool studies. 6.   Dehydration  Patient notes excessive watery diarrhea.  Anticipate initiation of IVF. 7.   Patient directed to the ER after lab results available.  Charge nurse and ER attending contacted.  Addendum:  Patient with fever and neutropenia.  WBC 1800 (ANC[place]0).  Platelets 86,000.  BUN 52 and Cr 1.60 (previously 35 and 1.38, respectively).  Lactic acid 1.0.  CXR no airspace disease. [facility]  Blood and urine cultures pending.  Pomalyst to be held.    I discussed the assessment and treatment plan with the patient.  The patient was provided an opportunity to ask questions and all were answered.  The patient agreed with the plan and demonstrated an understanding of the instructions.  The patient was advised to call back if the symptoms worsen or if the condition fails to improve as anticipated.    MelLequita AsalD, PhD    10/15/2019, 4:06 PM

## 2019-10-15 NOTE — Telephone Encounter (Signed)
I called to do this patient assesement and she reports she developed a rash on her right hand and stomach after the darzalex injection on Monday she is having constipation to diarrhea and vomiting twice last night with a fever of 101.3 last night and still running a low grade fever 99.6 with decrease appetite. I have inform [facility] and Dr Mike Gip. Per Dr Mike Gip the patient need to be [facility].

## 2019-10-15 NOTE — Progress Notes (Signed)
WT:UUEKC  Date of visit: 10/15/2019  Diagnosis: Multiple Myeloma  Current Treatment: pomalyst and darzalex  I connected with [name] on 10/19/19 at  1:00 PM EDT by video enabled telemedicine visit and verified that I am speaking with the correct person using two identifiers.   I discussed the limitations, risks, security and privacy concerns of performing an evaluation and management service by telemedicine and the availability of in-person appointments. I also discussed with the patient that there may be a patient responsible charge related to this service. The patient expressed understanding and agreed to proceed.   Other persons participating in the visit and their role in the encounter: None  Patient's location: Home Provider's location: Office  Chief Complaint: Diarrhea/Fever   Oncology History:  Oncology History Overview Note  # June 2017- IgALamda MULTIPLE MYELOMA [BMBx- 80% plasma cells; [facility]]; Lamda light chain 1340; s/p RVD   # 14th DEC 2016- Auto-Stem cell transplant [[facility];Dr.Hertzig]; rev [dose reduced sec to [place]  # Maintenance Revlimid- 33m 3 w-ON & 1 week OFF; HELD July 2018- sec to ONJ  # Bone lesions-numerous ~563mlytic lesions in Thoracic spine- on Zometa  # 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  # "Colitis" s/p multiple EGD/Colonoscopies; EGD- patchy inflammation/colo- Nov 2018 [Dr.Anna]  DIAGNOSIS: Multiple myeloma  GOALS: Control/palliative  CURRENT/MOST RECENT THERAPY -surveillance    Multiple myel[place] not having achieved remission ([place] 09/27/2019 -  Chemotherapy   The patient had daratumumab-hyaluronidase-fihj (DARZALEX FASPRO) 1800-30000 MG-UT/15ML chemo SQ injection 1,800 mg, 1,800 mg, Subcutaneous,  Once, 1 of 7 cycles Administration:  1,800 mg (09/27/2019), 1,800 mg (10/04/2019), 1,800 mg (10/11/2019)  for chemotherapy treatment.      Subjective Data: Sarah [name] a [age]ear old female with past medical history significant for anemia, anxiety, CHF, CKD, depression, diabetes, hypertension who is currently being treated for multiple myeloma with Pomalyst and daratumumab.  She is also status post autologous stem cell transplant back in 2016.   She was evaluated by Dr. CoMike Gipn 10/11/2019 and appeared to be stable.  She had variable bowel habits, chronic nausea, chronic neuropathy, intermittent dizziness, loss of taste, intermittent sore throat, leg cramps, night sweats, intermittent back and shoulder blade pain.  She presents to symptom management clinic today for complaints of fever, rash and diarrhea.  Patient states she developed diarrhea yesterday evening that lasted through the night; consistency is watery.  Patient states she suffers from constipation and takes lactulose as needed.  Patient developed constipation on Monday and Tuesday and took a dose of lactulose on Wednesday morning.  She also has chronically low magnesium levels.  She received 4 g of magnesium on Monday and again on Thursday.  She has almost completed her first cycle of Pomalyst and Darzalex.  She has 2 more days of Pomalyst.  Fever developed yesterday afternoon into the evening.  Temperature max of 101.3 was noted last night.  Fever resolved on its own.  She has not taken Tylenol.  She complains of chronic sore throat and cough.  No dysuria, hematuria or increased urinary frequency.  She has chronic fatigue and generalized weakness.  She does not have a very good appetite but continues to drink fluids. She does not feel dehydrated.   Review of Systems  Constitutional: Positive for fever and malaise/fatigue. Negative for chills  and weight loss.  HENT: Positive for sore throat (chronic). Negative for congestion, ear pain and tinnitus.   Eyes: Negative.   Negative for blurred vision and double vision.  Respiratory: Negative.  Negative for cough, sputum production and shortness of breath.   Cardiovascular: Negative.  Negative for chest pain, palpitations and leg swelling.  Gastrointestinal: Positive for diarrhea. Negative for abdominal pain, constipation, nausea and vomiting.  Genitourinary: Negative for dysuria, frequency and urgency.  Musculoskeletal: Negative for back pain and falls.  Skin: Positive for itching and rash.  Neurological: Positive for weakness. Negative for headaches.  Endo/Heme/Allergies: Negative.  Does not bruise/bleed easily.  Psychiatric/Behavioral: Negative.  Negative for depression. The patient is not nervous/anxious and does not have insomnia.    Plan: Lab work per Dr. Mike Gip (CBC, CMP, UA, Mag) If neutropenic, she will need fever work-up:  Chest x-ray  Labs (lactic acid, blood cultures X 2)  Urinalysis  Stool sample   Disposition: Patient to present to [facility] for lab work. RTC on Monday 10/18/19 for labs and Md assessment.   Addendum: Patient found to be neutropenic.  Patient was taken to emergency room for further work-up and evaluation for neutropenic fever per Dr. Mike Gip.  I provided 22 minutes of face-to-face video visit time during this encounter, and > 50% was spent counseling as documented under my assessment & plan.  Sarah Casa, NP 10/19/2019 3:17 PM

## 2019-10-15 NOTE — ED Provider Notes (Signed)
[facility] Emergency Department Provider Note  ____________________________________________   First MD Initiated Contact with Patient 10/15/19 2217     (approximate)  I have reviewed the triage vital signs and the nursing notes.   HISTORY  Chief Complaint Fever    HPI Sarah [name] is a 64 y.o. female with multiple myeloma on chemotherapy who comes in from Dr. Kem Parkinson office for admission.  Patient has chronic nausea control with Phenergan.  Patient reports having a fever of 101.3 associate with chills that started last night.  She reports a slight cough with it.  Patient was found to have a low white count was told that there was concern for neutropenia so was sent to the ER for IV fluids and antibiotics.  Patient already had blood cultures drawn.  Patient states that she always has nausea, moderate, better with Phenergan, nothing makes it worse.  Denies any new abdominal pain.        Past Medical History:  Diagnosis Date  . Abnormal stress test 02/14/2016   Overview:  Added automatically from request for surgery [id number]  . Anemia   . Anxiety   . Arthritis   . Bicuspid aortic valve   . CHF (congestive heart failure) ([place])   . CKD (chronic kidney disease) stage 3, GFR 30-59 ml/min   . Depression   . Diabetes mellitus ([place])   . Dizziness   . Fatty liver   . Frequent falls   . GERD (gastroesophageal reflux disease)   . Gout   . Heart murmur   . History of blood transfusion   . History of bone marrow transplant ([place])   . History of uterine fibroid   . Hx of cardiac catheterization 06/05/2016   Overview:  Normal coronaries 2017  . Hypertension   . Hypomagnesemia   . Multiple myeloma ([place])   . Personal history of chemotherapy   . Renal cyst     Patient Active Problem List   Diagnosis Date Noted  . Encounter for antineoplastic immunotherapy 10/03/2019  . Hypocalcemia 08/28/2019  . Stage 3 chronic kidney disease 05/12/2019  . Chronic  nausea 12/17/2018  . Chemotherapy-induced neuropathy ([place]) 12/17/2018  . Hyperlipidemia associated with type 2 diabetes mellitus ([place]) 11/25/2018  . Normocytic anemia 10/21/2018  . Hypomagnesemia 08/20/2018  . Goals of care, counseling/discussion 08/20/2018  . B12 deficiency 08/20/2018  . Thrombocytopenia ([place]) 02/09/2018  . Benign essential HTN 08/21/2017  . Cardiomyopathy, idiopathic ([place]) 08/21/2017  . Carotid artery stenosis, asymptomatic, bilateral 08/06/2017  . Thyroid nodule 08/06/2017  . Cardiac syncope 07/25/2017  . Iron deficiency anemia due to chronic blood loss 05/13/2017  . Spinal stenosis of lumbar region with neurogenic claudication 04/09/2017  . 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 LBBB (left bundle branch block) 10/10/2016  . Long term prescription opiate use 09/07/2016  . Chronic pain syndrome 07/08/2016  . Pain medication agreement signed 07/08/2016  . Spondylosis of lumbar region without myelopathy or radiculopathy 07/08/2016  . Chronic diastolic CHF (congestive heart failure), NYHA class 2 ([place]) 06/05/2016  . LVH (left ventricular hypertrophy) due to hypertensive disease, with heart failure ([place]) 06/05/2016  . Mild aortic valve stenosis 06/05/2016  . 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[place]) 06/05/2016  . Environmental and seasonal allergies 04/19/2016  . Insomnia 04/19/2016  . Bicuspid aortic valve 04/19/2016  . Asthma 04/19/2016  . Aortic regurgitation 04/19/2016  . Type II diabetes mellitus with complication ([place]) 37/16/9678  . Depression, major, single episode, in partial remission ([place]) 04/12/2016  .  Irritable bowel syndrome (IBS) 04/12/2016  . Hepatic steatosis 04/12/2016  . Multiple myeloma not having achieved remission ([place]) 04/02/2016  . 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[place]) 07/06/2015  . Stem cell transplant candidate 05/16/2015  . Disequilibrium 01/14/2013    Past Surgical History:  Procedure Laterality Date  .  ABDOMINAL HYSTERECTOMY    . 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 CARDIAC ELECTROPHYSIOLOGY MAPPING AND ABLATION    . CARPAL TUNNEL RELEASE Bilateral   . CHOLECYSTECTOMY  2008  . COLONOSCOPY WITH PROPOFOL N/A 05/07/2017   Procedure: COLONOSCOPY WITH PROPOFOL;  Surgeon: [name], MD;  Location: [facility] ENDOSCOPY;  Service: Gastroenterology;  Laterality: N/A;  . ESOPHAGOGASTRODUODENOSCOPY (EGD) WITH PROPOFOL N/A 05/07/2017   Procedure: ESOPHAGOGASTRODUODENOSCOPY (EGD) WITH PROPOFOL;  Surgeon: [name], MD;  Location: [facility] ENDOSCOPY;  Service: Gastroenterology;  Laterality: N/A;  . FOOT SURGERY Bilateral   . INCONTINENCE SURGERY  2009  . INTERSTIM IMPLANT PLACEMENT    . other     over active bladder  . OTHER SURGICAL HISTORY     bladder stimulator   . PARTIAL HYSTERECTOMY  03/1996   fibroids  . PORTA CATH INSERTION N/A 03/10/2019   Procedure: PORTA CATH INSERTION;  Surgeon: Algernon Huxley, MD;  Location: [place] CV LAB;  Service: Cardiovascular;  Laterality: N/A;  . TONSILLECTOMY  2007    Prior to Admission medications   Medication Sig Start Date End Date Taking? Authorizing Provider  acetaminophen (TYLENOL) 500 MG tablet Take 500 mg by mouth every 6 (six) hours as needed.    [provider]  acyclovir (ZOVIRAX) 400 MG tablet Take 1 tablet (400 mg total) by mouth 2 (two) times daily. 09/23/19   Lequita Asal, MD  allopurinol (ZYLOPRIM) 100 MG tablet TAKE 1 TABLET(100 MG) BY MOUTH DAILY as needed 12/02/18   Glean Hess, MD  aspirin 81 MG chewable tablet Chew 1 tablet (81 mg total) by mouth daily. 07/27/17   Gladstone Lighter, MD  bisoprolol (ZEBETA) 10 MG tablet TAKE 1 TABLET(10 MG) BY MOUTH DAILY 04/16/18   Glean Hess, MD  dexamethasone (DECADRON) 4 MG tablet Take 5 tablets (13m) on days 2, 9, 16, 23 of cycle 1 and 2 Take 5 tablets (282m on days 2, 16 of cycle 3,4,5 and 6 Take 5 tablets (2044mon days 2 of cycles 7 and beyond 09/27/19   CorLequita AsalMD  diclofenac sodium (VOLTAREN) 1 % GEL Apply 2 g topically 4 (four) times daily. 01/25/19   BenGertie BaronP  diphenhydrAMINE (BENADRYL) 50 MG capsule Take 50 mg by mouth as needed.    [provider]  diphenoxylate-atropine (LOMOTIL) 2.5-0.025 MG tablet Take 2 tablets by mouth daily as needed for diarrhea or loose stools. 09/23/19   CorLequita AsalD  fentaNYL (DURAGESIC - DOSED MCG/HR) 25 MCG/HR patch Place 25 mcg onto the skin every 3 (three) days.  08/12/16   [provider]  fexofenadine (ALLEGRA) 180 MG tablet TAKE 1 TABLET(180 MG) BY MOUTH DAILY 05/30/17   BerGlean HessD  FLUoxetine (PROZAC) 40 MG capsule TAKE 1 CAPSULE EVERY DAY 09/30/19   BerGlean HessD  fluticasone (FLBrazosport Eye Institute0 MCG/ACT nasal spray Place 2 sprays into both nostrils daily. 07/10/18   BerGlean HessD  furosemide (LASIX) 20 MG tablet Take 20 mg by mouth as needed.  12/09/18   [provider]  glucose blood (ONE TOUCH ULTRA TEST) test strip  09/04/15   [provider]  hydrOXYzine (ATARAX/VISTARIL) 10 MG tablet TAKE 1 TABLET(10 MG) BY MOUTH THREE TIMES DAILY AS NEEDED 05/30/17   Glean Hess, MD  lidocaine-prilocaine (EMLA) cream APPLY TOPICALLY AS NEEDED. 04/07/19   Lequita Asal, MD  lisinopril (PRINIVIL,ZESTRIL) 10 MG tablet Take 10 mg by mouth daily.    [provider]  Magnesium Bisglycinate (MAG GLYCINATE) 100 MG TABS Take 100 mg by mouth 2 (two) times daily. 08/20/18   Karen Kitchens, NP  metFORMIN (GLUCOPHAGE-XR) 500 MG 24 hr tablet Take 1 tablet (500 mg total) by mouth daily with breakfast. 12/02/18   Glean Hess, MD  Misc Natural Products (OSTEO BI-FLEX ADV TRIPLE ST PO) Take 2 tablets by mouth daily.    [provider]  montelukast (SINGULAIR) 10 MG tablet Take 1 tablet by mouth on the day before treatment and for 2 days after treatment. 09/23/19   Lequita Asal, MD  Multiple Vitamins-Minerals (HAIR/SKIN/NAILS/BIOTIN PO)  Take 1 capsule by mouth 3 (three) times daily.    [provider]  NARCAN 4 MG/0.1ML LIQD nasal spray kit 1 spray. 04/21/19   [provider]  NON FORMULARY     [provider]  Omega 3-6-9 Fatty Acids ([place] 3-6-9 COMPLEX) CAPS Take by mouth.    [provider]  omeprazole (PRILOSEC) 40 MG capsule Take 1 capsule (40 mg total) by mouth daily. 12/11/18   Glean Hess, MD  POMALYST 4 MG capsule TAKE 1 CAPSULE (4MG TOTAL) BY MOUTH EVERY DAY FOR 21 DAYS, THEN HOLD FOR 7 DAYS. REPEAT EVERY 28 DAYS 10/10/19   Lequita Asal, MD  pravastatin (PRAVACHOL) 20 MG tablet TAKE 1 TABLET EVERY DAY 09/30/19   Glean Hess, MD  promethazine (PHENERGAN) 25 MG tablet Take 1 tablet (25 mg total) by mouth 2 (two) times daily as needed for nausea or vomiting. Patient not taking: Reported on 10/15/2019 10/05/19   Lequita Asal, MD  promethazine-dextromethorphan (PROMETHAZINE-DM) 6.25-15 MG/5ML syrup Take 5 mLs by mouth 4 (four) times daily as needed for cough. Patient not taking: Reported on 09/22/2019 09/08/19   Glean Hess, MD  tiZANidine (ZANAFLEX) 4 MG tablet Take 1 tablet (4 mg total) by mouth every 8 (eight) hours as needed for muscle spasms. 12/02/18   Glean Hess, MD  traZODone (DESYREL) 100 MG tablet TAKE 1 TABLET AT BEDTIME  FOR  SLEEP 09/30/19   Glean Hess, MD    Allergies Oxycodone-acetaminophen, Celebrex [celecoxib], Codeine, Benadryl [diphenhydramine], Morphine, Ondansetron, and Tylenol [acetaminophen]  Family History  Problem Relation Age of Onset  . Colon cancer Father   . Renal Disease Father   . Diabetes Mellitus II Father   . Melanoma Paternal Grandmother   . Breast cancer Maternal Aunt 42  . Anemia Mother   . Heart disease Mother   . Heart failure Mother   . Renal Disease Mother   . Congestive Heart Failure Mother   . Heart disease Maternal Uncle   . Throat cancer Maternal Uncle   . Lung cancer Maternal Uncle   . Liver  disease Maternal Uncle   . Heart failure Maternal Uncle   . Hearing loss Son 56       Suicide     Social History Social History   Tobacco Use  . Smoking status: Former Smoker    Packs/day: 1.00    Years: 20.00    Pack years: 20.00    Types: Cigarettes    Quit date: 07/02/1991  Years since quitting: 28.3  . Smokeless tobacco: Never Used  Substance Use Topics  . Alcohol use: Not Currently  . Drug use: No      Review of Systems Constitutional: Positive fever Eyes: No visual changes. ENT: No sore throat. Cardiovascular: Denies chest pain. Respiratory: Denies shortness of breath.  Positive cough Gastrointestinal: No abdominal pain.  Positive nausea, no vomiting.  No diarrhea.  No constipation. Genitourinary: Negative for dysuria. Musculoskeletal: Negative for back pain. Skin: Negative for rash. Neurological: Negative for headaches, focal weakness or numbness. All other ROS negative ____________________________________________   PHYSICAL EXAM:  VITAL SIGNS: ED Triage Vitals  Enc Vitals Group     BP 10/15/19 1751 (!) 111/51     Pulse Rate 10/15/19 1748 65     Resp 10/15/19 1748 16     Temp 10/15/19 1748 99.1 F (37.3 C)     Temp Source 10/15/19 1748 Oral     SpO2 10/15/19 1748 99 %     Weight 10/15/19 1749 149 lb 14.6 oz (68 kg)     Height 10/15/19 1749 5' 2"  (1.575 m)     Head Circumference --      Peak Flow --      Pain Score 10/15/19 1748 0     Pain Loc --      Pain Edu? --      Excl. in [place]? --     Constitutional: Alert and oriented. Well appearing and in no acute distress. Eyes: Conjunctivae are normal. EOMI. Head: Atraumatic. Nose: No congestion/rhinnorhea. Mouth/Throat: Mucous membranes are moist.   Neck: No stridor. Trachea Midline. FROM Cardiovascular: Normal rate, regular rhythm. Grossly normal heart sounds.  Good peripheral circulation.  Right chest wall port Respiratory: Normal respiratory effort.  No retractions. Lungs CTAB. Gastrointestinal:  Soft and nontender. No distention. No abdominal bruits.  Musculoskeletal: No lower extremity tenderness nor edema.  No joint effusions. Neurologic:  Normal speech and language. No gross focal neurologic deficits are appreciated.  Skin:  Skin is warm, dry and intact. No rash noted. Psychiatric: Mood and affect are normal. Speech and behavior are normal. GU: Deferred   ____________________________________________   LABS (all labs ordered are listed, but only abnormal results are displayed)  Labs Reviewed  COMPREHENSIVE METABOLIC PANEL - Abnormal; Notable for the following components:      Result Value   Sodium 134 (*)    Glucose, Bld 111 (*)    BUN 48 (*)    Creatinine, Ser 1.63 (*)    Calcium 8.3 (*)    Total Protein 6.4 (*)    GFR calc non Af Amer 33 (*)    GFR calc Af Amer 38 (*)    All other components within normal limits  CBC WITH DIFFERENTIAL/PLATELET - Abnormal; Notable for the following components:   WBC 1.7 (*)    RBC 3.28 (*)    Hemoglobin 10.2 (*)    HCT 29.1 (*)    Platelets 91 (*)    Neutro Abs 0.6 (*)    Lymphs Abs 0.6 (*)    All other components within normal limits  URINE CULTURE  RESPIRATORY PANEL BY RT PCR (FLU A&B, COVID)  LACTIC ACID, PLASMA  URINALYSIS, COMPLETE (UACMP) WITH MICROSCOPIC  PATHOLOGIST SMEAR REVIEW  PROCALCITONIN   ____________________________________________   RADIOLOGY Robert Bellow, personally viewed and evaluated these images (plain radiographs) as part of my medical decision making, as well as reviewing the written report by the radiologist.  ED MD interpretation: No pneumonia  Official radiology report(s): DG Chest 2 View  Result Date: 10/15/2019 CLINICAL DATA:  Fever. EXAM: CHEST - 2 VIEW COMPARISON:  Radiograph 01/04/2019 FINDINGS: Right chest port with tip in the SVC.The cardiomediastinal contours are normal. The lungs are clear. Pulmonary vasculature is normal. No consolidation, pleural effusion, or pneumothorax. No  acute osseous abnormalities are seen. IMPRESSION: No acute chest findings. Electronically Signed   By: Keith Rake M.D.   On: 10/15/2019 18:41   DG Chest 2 View  Result Date: 10/15/2019 CLINICAL DATA:  Fever. Multiple myeloma on oral chemotherapy. Fever, chills and fatigue. EXAM: CHEST - 2 VIEW COMPARISON:  Radiograph 2 hours prior. FINDINGS: Right chest port with tip in the lower SVC.The cardiomediastinal contours are normal. The lungs are clear. Pulmonary vasculature is normal. No consolidation, pleural effusion, or pneumothorax. No acute osseous abnormalities are seen. Degenerative change in the thoracic spine. IMPRESSION: No acute chest findings or evidence of pneumonia. Electronically Signed   By: Keith Rake M.D.   On: 10/15/2019 18:41    ____________________________________________   PROCEDURES  Procedure(s) performed (including Critical Care):  .Critical Care Performed by: Vanessa Narberth, MD Authorized by: Vanessa Willits, MD   Critical care provider statement:    Critical care time (minutes):  35   Critical care was necessary to treat or prevent imminent or life-threatening deterioration of the following conditions:  Sepsis   Critical care was time spent personally by me on the following activities:  Discussions with consultants, evaluation of patient's response to treatment, examination of patient, ordering and performing treatments and interventions, ordering and review of laboratory studies, ordering and review of radiographic studies, pulse oximetry, re-evaluation of patient's condition, obtaining history from patient or surrogate and review of old charts     ____________________________________________   INITIAL IMPRESSION / [place] / ED COURSE  Sarah [name] was evaluated in Emergency Department on 10/15/2019 for the symptoms described in the history of present illness. She was evaluated in the context of the global COVID-19 pandemic, which  necessitated consideration that the patient might be at risk for infection with the SARS-CoV-2 virus that causes COVID-19. Institutional protocols and algorithms that pertain to the evaluation of patients at risk for COVID-19 are in a state of rapid change based on information released by regulatory bodies including the CDC and federal and state organizations. These policies and algorithms were followed during the patient's care in the ED.    Patient is a 64 year old who comes in with reported fevers at home and neutropenia.  Patient sent for admission for broad-spectrum antibiotics.  Blood cultures and urine culture already pending.  Chest x-ray with no pneumonia.  No focal abdominal tenderness to suggest abdominal pathology.  Still needs a urine sample to evaluate for UTI.  Will get Covid swab although has been vaccinated.  Given patient is neutropenic we will discuss with the hospital team for admission.  Patient given full 30 cc/kg fluid resuscitation given with the fever and the white count she meets sirs criteria.  Patient also given broad-spectrum antibiotics.       ____________________________________________   FINAL CLINICAL IMPRESSION(S) / ED DIAGNOSES   Final diagnoses:  Neutropenic fever ([place])  Sepsis, due to unspecified organism, unspecified whether acute organ dysfunction present ([place])      MEDICATIONS GIVEN DURING THIS VISIT:  Medications  sodium chloride flush (NS) 0.9 % injection 3 mL (has no administration in time range)  ceFEPIme (MAXIPIME) 2 g in sodium chloride 0.9 % 100 mL IVPB (  has no administration in time range)  metroNIDAZOLE (FLAGYL) IVPB 500 mg (has no administration in time range)  vancomycin (VANCOCIN) IVPB 1000 mg/200 mL premix (has no administration in time range)  sodium chloride 0.9 % bolus 1,000 mL (1,000 mLs Intravenous New Bag/Given 10/15/19 2241)    And  sodium chloride 0.9 % bolus 1,000 mL (has no administration in time range)    And  sodium  chloride 0.9 % bolus 250 mL (has no administration in time range)  promethazine (PHENERGAN) injection 12.5 mg (has no administration in time range)     ED Discharge Orders    None       Note:  This document was prepared using Dragon voice recognition software and may include unintentional dictation errors.   Vanessa Superior, MD 10/15/19 2245

## 2019-10-15 NOTE — Progress Notes (Signed)
PHARMACY -  BRIEF ANTIBIOTIC NOTE   Pharmacy has received consult(s) for Cefepime and Vancomycin from an ED provider.  The patient's profile has been reviewed for ht/wt/allergies/indication/available labs.    One time order(s) placed for Cefepime 2gm and Vancomycin 1gm  Further antibiotics/pharmacy consults should be ordered by admitting physician if indicated.                       Thank you, Hart Robinsons A 10/15/2019  10:25 PM

## 2019-10-15 NOTE — Progress Notes (Signed)
CODE SEPSIS - PHARMACY COMMUNICATION  **Broad Spectrum Antibiotics should be administered within 1 hour of Sepsis diagnosis**  Time Code Sepsis Called/Page Received: 2225  Antibiotics Ordered: Cefepime, Flagyl, Vancomycin  Time of 1st antibiotic administration: 2247, Cefepime  Additional action taken by pharmacy: n/a   If necessary, Name of Provider/Nurse Contacted: n/a    Ena Dawley ,PharmD Clinical Pharmacist  10/15/2019  10:25 PM

## 2019-10-15 NOTE — H&P (Signed)
History and Physical    Sarah [name] JTT:[id number] DOB: 04-May-1956 DOA: 10/15/2019  PCP: Glean Hess, MD   Patient coming from: Home  I have personally briefly reviewed patient's old medical records in [place]  Chief Complaint: Sent by oncology for neutropenic fever  HPI: Sarah [name] is a 64 y.o. female with medical history significant for stage III multiple myeloma on chemotherapy, diabetes, hypertension, CKD 3, mild aortic stenosis, diastolic heart failure and history of a flutter status post ablation in 1999, who presented to her oncologist office, Dr. Mike Gip earlier today with a complaint of fever of 101.3, chills diarrhea and rash.  She was apparently constipated on the day prior and took lactulose but then developed watery diarrhea.  She had 3 episodes of vomiting.  She also complains of a sore throat which has been mostly chronic for her.  Has a slight cough but no shortness of breath.  Overall generalized malaise and poor appetite.  She denies dysuria, abdominal pain.  She had blood cultures and blood work done by oncology.  Blood cultures pending ED Course: On arrival in the emergency room temperature was 98.5, pulse 66.  Vitals overall unremarkable.  WBC was 1.7 and ANC of 800.  Creatinine was 1.63 above baseline of 1.13.  Chest x-ray showed no acute disease, urinalysis was unremarkable.  Patient was started on IV fluids, IV Vanco cefepime and Flagyl.  Hospitalist consulted for admission. Review of Systems: As per HPI otherwise 10 point review of systems negative.    Past Medical History:  Diagnosis Date   Abnormal stress test 02/14/2016   Overview:  Added automatically from request for surgery [id number]   Anemia    Anxiety    Arthritis    Bicuspid aortic valve    CHF (congestive heart failure) (HCC)    CKD (chronic kidney disease) stage 3, GFR 30-59 ml/min    Depression    Diabetes mellitus (HCC)    Dizziness    Fatty liver    Frequent falls     GERD (gastroesophageal reflux disease)    Gout    Heart murmur    History of blood transfusion    History of bone marrow transplant ([place])    History of uterine fibroid    Hx of cardiac catheterization 06/05/2016   Overview:  Normal coronaries 2017   Hypertension    Hypomagnesemia    Multiple myeloma ([place])    Personal history of chemotherapy    Renal cyst     Past Surgical History:  Procedure Laterality Date   ABDOMINAL HYSTERECTOMY     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   CARDIAC ELECTROPHYSIOLOGY MAPPING AND ABLATION     CARPAL TUNNEL RELEASE Bilateral    CHOLECYSTECTOMY  2008   COLONOSCOPY WITH PROPOFOL N/A 05/07/2017   Procedure: COLONOSCOPY WITH PROPOFOL;  Surgeon: [name], MD;  Location: [facility] ENDOSCOPY;  Service: Gastroenterology;  Laterality: N/A;   ESOPHAGOGASTRODUODENOSCOPY (EGD) WITH PROPOFOL N/A 05/07/2017   Procedure: ESOPHAGOGASTRODUODENOSCOPY (EGD) WITH PROPOFOL;  Surgeon: [name], MD;  Location: [facility] ENDOSCOPY;  Service: Gastroenterology;  Laterality: N/A;   FOOT SURGERY Bilateral    INCONTINENCE SURGERY  2009   INTERSTIM IMPLANT PLACEMENT     other     over active bladder   OTHER SURGICAL HISTORY     bladder stimulator    PARTIAL HYSTERECTOMY  03/1996   fibroids   PORTA CATH INSERTION N/A 03/10/2019   Procedure: PORTA CATH INSERTION;  Surgeon: Algernon Huxley, MD;  Location: [place] CV LAB;  Service: Cardiovascular;  Laterality: N/A;   TONSILLECTOMY  2007     reports that she quit smoking about 28 years ago. Her smoking use included cigarettes. She has a 20.00 pack-year smoking history. She has never used smokeless tobacco. She reports previous alcohol use. She reports that she does not use drugs.  Allergies  Allergen Reactions   Oxycodone-Acetaminophen Anaphylaxis    Swelling and rash   Celebrex [Celecoxib] Diarrhea   Codeine    Benadryl [Diphenhydramine] Palpitations   Morphine Itching and Rash   Ondansetron  Diarrhea   Tylenol [Acetaminophen] Itching and Rash    Family History  Problem Relation Age of Onset   Colon cancer Father    Renal Disease Father    Diabetes Mellitus II Father    Melanoma Paternal Grandmother    Breast cancer Maternal Aunt 24   Anemia Mother    Heart disease Mother    Heart failure Mother    Renal Disease Mother    Congestive Heart Failure Mother    Heart disease Maternal Uncle    Throat cancer Maternal Uncle    Lung cancer Maternal Uncle    Liver disease Maternal Uncle    Heart failure Maternal Uncle    Hearing loss Son 88       Suicide      Prior to Admission medications   Medication Sig Start Date End Date Taking? Authorizing Provider  acetaminophen (TYLENOL) 500 MG tablet Take 500 mg by mouth every 6 (six) hours as needed.    [provider]  acyclovir (ZOVIRAX) 400 MG tablet Take 1 tablet (400 mg total) by mouth 2 (two) times daily. 09/23/19   Lequita Asal, MD  allopurinol (ZYLOPRIM) 100 MG tablet TAKE 1 TABLET(100 MG) BY MOUTH DAILY as needed 12/02/18   Glean Hess, MD  aspirin 81 MG chewable tablet Chew 1 tablet (81 mg total) by mouth daily. 07/27/17   Gladstone Lighter, MD  bisoprolol (ZEBETA) 10 MG tablet TAKE 1 TABLET(10 MG) BY MOUTH DAILY 04/16/18   Glean Hess, MD  dexamethasone (DECADRON) 4 MG tablet Take 5 tablets (65m) on days 2, 9, 16, 23 of cycle 1 and 2 Take 5 tablets (213m on days 2, 16 of cycle 3,4,5 and 6 Take 5 tablets (2072mon days 2 of cycles 7 and beyond 09/27/19   CorLequita AsalD  diclofenac sodium (VOLTAREN) 1 % GEL Apply 2 g topically 4 (four) times daily. 01/25/19   BenGertie BaronP  diphenhydrAMINE (BENADRYL) 50 MG capsule Take 50 mg by mouth as needed.    [provider]  diphenoxylate-atropine (LOMOTIL) 2.5-0.025 MG tablet Take 2 tablets by mouth daily as needed for diarrhea or loose stools. 09/23/19   CorLequita AsalD  fentaNYL (DURAGESIC - DOSED MCG/HR) 25  MCG/HR patch Place 25 mcg onto the skin every 3 (three) days.  08/12/16   [provider]  fexofenadine (ALLEGRA) 180 MG tablet TAKE 1 TABLET(180 MG) BY MOUTH DAILY 05/30/17   BerGlean HessD  FLUoxetine (PROZAC) 40 MG capsule TAKE 1 CAPSULE EVERY DAY 09/30/19   BerGlean HessD  fluticasone (FLNew [facility]ium Surgery [facility] PLLC0 MCG/ACT nasal spray Place 2 sprays into both nostrils daily. 07/10/18   BerGlean HessD  furosemide (LASIX) 20 MG tablet Take 20 mg by mouth as needed.  12/09/18   [provider]  glucose blood (ONE TOUCH ULTRA TEST) test strip  09/04/15   [provider]  hydrOXYzine (ATARAX/VISTARIL) 10 MG tablet TAKE 1 TABLET(10 MG) BY MOUTH THREE TIMES DAILY AS NEEDED 05/30/17   Glean Hess, MD  lidocaine-prilocaine (EMLA) cream APPLY TOPICALLY AS NEEDED. 04/07/19   Lequita Asal, MD  lisinopril (PRINIVIL,ZESTRIL) 10 MG tablet Take 10 mg by mouth daily.    [provider]  Magnesium Bisglycinate (MAG GLYCINATE) 100 MG TABS Take 100 mg by mouth 2 (two) times daily. 08/20/18   Karen Kitchens, NP  metFORMIN (GLUCOPHAGE-XR) 500 MG 24 hr tablet Take 1 tablet (500 mg total) by mouth daily with breakfast. 12/02/18   Glean Hess, MD  Misc Natural Products (OSTEO BI-FLEX ADV TRIPLE ST PO) Take 2 tablets by mouth daily.    [provider]  montelukast (SINGULAIR) 10 MG tablet Take 1 tablet by mouth on the day before treatment and for 2 days after treatment. 09/23/19   Lequita Asal, MD  Multiple Vitamins-Minerals (HAIR/SKIN/NAILS/BIOTIN PO) Take 1 capsule by mouth 3 (three) times daily.    [provider]  NARCAN 4 MG/0.1ML LIQD nasal spray kit 1 spray. 04/21/19   [provider]  NON FORMULARY     [provider]  Omega 3-6-9 Fatty Acids (Ronan 3-6-9 COMPLEX) CAPS Take by mouth.    [provider]  omeprazole (PRILOSEC) 40 MG capsule Take 1 capsule (40 mg total) by mouth daily. 12/11/18   Glean Hess, MD    POMALYST 4 MG capsule TAKE 1 CAPSULE (4MG TOTAL) BY MOUTH EVERY DAY FOR 21 DAYS, THEN HOLD FOR 7 DAYS. REPEAT EVERY 28 DAYS 10/10/19   Lequita Asal, MD  pravastatin (PRAVACHOL) 20 MG tablet TAKE 1 TABLET EVERY DAY 09/30/19   Glean Hess, MD  promethazine (PHENERGAN) 25 MG tablet Take 1 tablet (25 mg total) by mouth 2 (two) times daily as needed for nausea or vomiting. Patient not taking: Reported on 10/15/2019 10/05/19   Lequita Asal, MD  promethazine-dextromethorphan (PROMETHAZINE-DM) 6.25-15 MG/5ML syrup Take 5 mLs by mouth 4 (four) times daily as needed for cough. Patient not taking: Reported on 09/22/2019 09/08/19   Glean Hess, MD  tiZANidine (ZANAFLEX) 4 MG tablet Take 1 tablet (4 mg total) by mouth every 8 (eight) hours as needed for muscle spasms. 12/02/18   Glean Hess, MD  traZODone (DESYREL) 100 MG tablet TAKE 1 TABLET AT BEDTIME  FOR  SLEEP 09/30/19   Glean Hess, MD    Physical Exam: Vitals:   10/15/19 1748 10/15/19 1749 10/15/19 1751 10/15/19 2215  BP:   (!) 111/51 (!) 113/44  Pulse: 65   66  Resp: 16   16  Temp: 99.1 F (37.3 C)   98.5 F (36.9 C)  TempSrc: Oral   Oral  SpO2: 99%   98%  Weight:  68 kg    Height:  5' 2"  (1.575 m)       Vitals:   10/15/19 1748 10/15/19 1749 10/15/19 1751 10/15/19 2215  BP:   (!) 111/51 (!) 113/44  Pulse: 65   66  Resp: 16   16  Temp: 99.1 F (37.3 C)   98.5 F (36.9 C)  TempSrc: Oral   Oral  SpO2: 99%   98%  Weight:  68 kg    Height:  5' 2"  (1.575 m)      Constitutional: Alert and awake, oriented x3, not in any acute distress. Eyes: PERLA, EOMI, irises appear normal, anicteric sclera,  ENMT: external ears and nose appear normal, normal hearing  Lips appears normal, oropharynx mucosa, tongue, posterior pharynx appear normal  Neck: neck appears normal, no masses, normal ROM, no thyromegaly, no JVD  CVS: S1-S2 clear, no murmur rubs or gallops,  , no carotid bruits, pedal pulses palpable, No  LE edema Respiratory:  clear to auscultation bilaterally, no wheezing, rales or rhonchi. Respiratory effort normal. No accessory muscle use.  Abdomen: soft nontender, nondistended, normal bowel sounds, no hepatosplenomegaly, no hernias Musculoskeletal: : no cyanosis, clubbing , no contractures or atrophy Neuro: Cranial nerves II-XII intact, sensation, reflexes normal, strength Psych: judgement and insight appear normal, stable mood and affect,  Skin: no rashes or lesions or ulcers, no induration or nodules   Labs on Admission: I have personally reviewed following labs and imaging studies  CBC: Recent Labs  Lab 10/11/19 0827 10/15/19 1557 10/15/19 1759  WBC 3.4* 1.8* 1.7*  NEUTROABS 2.4 0.8* 0.6*  HGB 9.9* 10.0* 10.2*  HCT 28.9* 28.8* 29.1*  MCV 89.8 89.2 88.7  PLT 80* 86* 91*   Basic Metabolic Panel: Recent Labs  Lab 10/11/19 0827 10/14/19 0903 10/15/19 1557 10/15/19 1759  NA 135  --  132* 134*  K 4.3  --  4.2 4.2  CL 103  --  102 103  CO2 22  --  22 23  GLUCOSE 165*  --  107* 111*  BUN 35*  --  52* 48*  CREATININE 1.38*  --  1.60* 1.63*  CALCIUM 8.6*  --  8.1* 8.3*  MG 1.1* 1.4* 2.2  --    GFR: Estimated Creatinine Clearance: 32 mL/min (A) (by C-G formula based on SCr of 1.63 mg/dL (H)). Liver Function Tests: Recent Labs  Lab 10/11/19 0827 10/15/19 1557 10/15/19 1759  AST 20 17 16   ALT 25 29 30   ALKPHOS 82 76 70  BILITOT 0.4 0.5 0.8  PROT 6.1* 6.5 6.4*  ALBUMIN 3.7 3.8 3.9   No results for input(s): LIPASE, AMYLASE in the last 168 hours. No results for input(s): AMMONIA in the last 168 hours. Coagulation Profile: No results for input(s): INR, PROTIME in the last 168 hours. Cardiac Enzymes: No results for input(s): CKTOTAL, CKMB, CKMBINDEX, TROPONINI in the last 168 hours. BNP (last 3 results) No results for input(s): PROBNP in the last 8760 hours. HbA1C: No results for input(s): HGBA1C in the last 72 hours. CBG: No results for input(s): GLUCAP in  the last 168 hours. Lipid Profile: No results for input(s): CHOL, HDL, LDLCALC, TRIG, CHOLHDL, LDLDIRECT in the last 72 hours. Thyroid Function Tests: No results for input(s): TSH, T4TOTAL, FREET4, T3FREE, THYROIDAB in the last 72 hours. Anemia Panel: No results for input(s): VITAMINB12, FOLATE, FERRITIN, TIBC, IRON, RETICCTPCT in the last 72 hours. Urine analysis:    Component Value Date/Time   COLORURINE YELLOW 10/15/2019 1557   APPEARANCEUR CLEAR 10/15/2019 1557   APPEARANCEUR Clear 07/13/2018 0835   LABSPEC 1.020 10/15/2019 1557   PHURINE 5.0 10/15/2019 1557   GLUCOSEU NEGATIVE 10/15/2019 1557   HGBUR NEGATIVE 10/15/2019 1557   BILIRUBINUR NEGATIVE 10/15/2019 1557   BILIRUBINUR neg 11/25/2018 1021   BILIRUBINUR Negative 07/13/2018 0835   KETONESUR NEGATIVE 10/15/2019 1557   PROTEINUR TRACE (A) 10/15/2019 1557   UROBILINOGEN 0.2 11/25/2018 1021   NITRITE NEGATIVE 10/15/2019 1557   LEUKOCYTESUR NEGATIVE 10/15/2019 1557    Radiological Exams on Admission: DG Chest 2 View  Result Date: 10/15/2019 CLINICAL DATA:  Fever. EXAM: CHEST - 2 VIEW COMPARISON:  Radiograph 01/04/2019 FINDINGS: Right chest port with tip in the SVC.The cardiomediastinal contours are  normal. The lungs are clear. Pulmonary vasculature is normal. No consolidation, pleural effusion, or pneumothorax. No acute osseous abnormalities are seen. IMPRESSION: No acute chest findings. Electronically Signed   By: Keith Rake M.D.   On: 10/15/2019 18:41   DG Chest 2 View  Result Date: 10/15/2019 CLINICAL DATA:  Fever. Multiple myeloma on oral chemotherapy. Fever, chills and fatigue. EXAM: CHEST - 2 VIEW COMPARISON:  Radiograph 2 hours prior. FINDINGS: Right chest port with tip in the lower SVC.The cardiomediastinal contours are normal. The lungs are clear. Pulmonary vasculature is normal. No consolidation, pleural effusion, or pneumothorax. No acute osseous abnormalities are seen. Degenerative change in the thoracic  spine. IMPRESSION: No acute chest findings or evidence of pneumonia. Electronically Signed   By: Keith Rake M.D.   On: 10/15/2019 18:41    EKG: Independently reviewed.   Assessment/Plan Principal Problem:    Neutropenic fever (HCC)   Multiple myeloma not having achieved remission (HCC) -ANC of 800/WBC 1800.  Temperature at home was 101.3.  At primary oncologist office lactic acid 1.0. -Source uncertain.  Patient has diarrhea but this started after lactulose for constipation -IV cefepime, vancomycin and metronidazole -IV hydration -Follow cultures -Oncology consult    AKI (acute kidney injury) superimposed on CKD 3 (HCC) -Creatinine 1.63 above baseline of 1.13 -Suspect related to dehydration from watery diarrhea and poor oral intake -IV hydration -Monitor renal function and avoid nephrotoxins    Type II diabetes mellitus with complication (HCC) -Sliding scale insulin coverage    Chronic diastolic CHF (congestive heart failure), NYHA class 2 (HCC) -Currently euvolemic.  Last EF 50% and October 2020 -Intake and output monitoring    Mild aortic valve stenosis -No acute concerns but monitor in view of IV hydration -Has been seen by Dr. Nehemiah Massed in the past    Benign essential HTN -Blood pressure soft so we will hold home antihypertensives    DVT prophylaxis: SCDs because of thrombocytopenia Code Status: full code  Family Communication:  none  Disposition Plan: Back to previous home environment Consults called: none  Status:inp    Athena Masse MD [facility]     10/15/2019, 11:41 PM

## 2019-10-15 NOTE — ED Triage Notes (Signed)
Pt presents to ED via POV with c/o fever. Pt states sent by cancer center. Pt states was referred for neutropenic fever. Pt states is currently taking oral chemo for multiple myeloma, last dose this AM. Pt c/o fever, chills, fatigue, diarrhea, vomiting, sore throat, and itchy mouth. Pt states that her temperature at home was 101.3 last night and today has been around 100. Pt is in NAD.

## 2019-10-16 DIAGNOSIS — R5081 Fever presenting with conditions classified elsewhere: Secondary | ICD-10-CM | POA: Diagnosis not present

## 2019-10-16 DIAGNOSIS — D709 Neutropenia, unspecified: Principal | ICD-10-CM

## 2019-10-16 LAB — DIFFERENTIAL
Abs Immature Granulocytes: 0 10*3/uL (ref 0.00–0.07)
Basophils Absolute: 0 10*3/uL (ref 0.0–0.1)
Basophils Relative: 2 %
Eosinophils Absolute: 0.2 10*3/uL (ref 0.0–0.5)
Eosinophils Relative: 18 %
Immature Granulocytes: 0 %
Lymphocytes Relative: 31 %
Lymphs Abs: 0.4 10*3/uL — ABNORMAL LOW (ref 0.7–4.0)
Monocytes Absolute: 0.2 10*3/uL (ref 0.1–1.0)
Monocytes Relative: 16 %
Neutro Abs: 0.4 10*3/uL — ABNORMAL LOW (ref 1.7–7.7)
Neutrophils Relative %: 33 %
Smear Review: DECREASED

## 2019-10-16 LAB — URINALYSIS, COMPLETE (UACMP) WITH MICROSCOPIC
Bilirubin Urine: NEGATIVE
Glucose, UA: NEGATIVE mg/dL
Hgb urine dipstick: NEGATIVE
Ketones, ur: NEGATIVE mg/dL
Leukocytes,Ua: NEGATIVE
Nitrite: NEGATIVE
Protein, ur: NEGATIVE mg/dL
Specific Gravity, Urine: 1.011 (ref 1.005–1.030)
pH: 5 (ref 5.0–8.0)

## 2019-10-16 LAB — CBC
HCT: 27 % — ABNORMAL LOW (ref 36.0–46.0)
HCT: 26.8 % — ABNORMAL LOW (ref 36.0–46.0)
Hemoglobin: 9.3 g/dL — ABNORMAL LOW (ref 12.0–15.0)
Hemoglobin: 9.2 g/dL — ABNORMAL LOW (ref 12.0–15.0)
MCH: 30.8 pg (ref 26.0–34.0)
MCH: 31.3 pg (ref 26.0–34.0)
MCHC: 34.4 g/dL (ref 30.0–36.0)
MCHC: 34.3 g/dL (ref 30.0–36.0)
MCV: 89.4 fL (ref 80.0–100.0)
MCV: 91.2 fL (ref 80.0–100.0)
Platelets: 74 10*3/uL — ABNORMAL LOW (ref 150–400)
Platelets: 70 10*3/uL — ABNORMAL LOW (ref 150–400)
RBC: 3.02 MIL/uL — ABNORMAL LOW (ref 3.87–5.11)
RBC: 2.94 MIL/uL — ABNORMAL LOW (ref 3.87–5.11)
RDW: 13.9 % (ref 11.5–15.5)
RDW: 13.8 % (ref 11.5–15.5)
WBC: 1.3 10*3/uL — CL (ref 4.0–10.5)
WBC: 1.3 10*3/uL — CL (ref 4.0–10.5)
nRBC: 0 % (ref 0.0–0.2)
nRBC: 0 % (ref 0.0–0.2)

## 2019-10-16 LAB — BASIC METABOLIC PANEL
Anion gap: 6 (ref 5–15)
BUN: 34 mg/dL — ABNORMAL HIGH (ref 8–23)
CO2: 21 mmol/L — ABNORMAL LOW (ref 22–32)
Calcium: 7.5 mg/dL — ABNORMAL LOW (ref 8.9–10.3)
Chloride: 110 mmol/L (ref 98–111)
Creatinine, Ser: 1.08 mg/dL — ABNORMAL HIGH (ref 0.44–1.00)
GFR calc Af Amer: >60 mL/min (ref >60)
GFR calc non Af Amer: 55 mL/min — ABNORMAL LOW (ref >60)
Glucose, Bld: 128 mg/dL — ABNORMAL HIGH (ref 70–99)
Potassium: 4.3 mmol/L (ref 3.5–5.1)
Sodium: 137 mmol/L (ref 135–145)

## 2019-10-16 LAB — GLUCOSE, CAPILLARY
Glucose-Capillary: 104 mg/dL — ABNORMAL HIGH (ref 70–99)
Glucose-Capillary: 136 mg/dL — ABNORMAL HIGH (ref 70–99)
Glucose-Capillary: 161 mg/dL — ABNORMAL HIGH (ref 70–99)

## 2019-10-16 LAB — RESPIRATORY PANEL BY RT PCR (FLU A&B, COVID)
Influenza A by PCR: NEGATIVE
Influenza B by PCR: NEGATIVE
SARS Coronavirus 2 by RT PCR: NEGATIVE

## 2019-10-16 LAB — HEMOGLOBIN A1C
Hgb A1c MFr Bld: 6.4 % — ABNORMAL HIGH (ref 4.8–5.6)
Mean Plasma Glucose: 136.98 mg/dL

## 2019-10-16 LAB — HIV ANTIBODY (ROUTINE TESTING W REFLEX): HIV Screen 4th Generation wRfx: NONREACTIVE

## 2019-10-16 MED ORDER — SODIUM CHLORIDE 0.9 % IV SOLN
INTRAVENOUS | Status: DC | PRN
  Administered 2019-10-16: 250 mL via INTRAVENOUS

## 2019-10-16 MED ORDER — TBO-FILGRASTIM 480 MCG/0.8ML ~~LOC~~ SOSY
480.0000 ug | PREFILLED_SYRINGE | Freq: Every day | SUBCUTANEOUS | Status: DC
  Administered 2019-10-16 – 2019-10-20 (×5): 480 ug via SUBCUTANEOUS
  Filled 2019-10-16 (×5): qty 0.8

## 2019-10-16 MED ORDER — MAGIC MOUTHWASH W/LIDOCAINE: 10.0000 mL | Freq: Three times a day (TID) | ORAL | Status: DC

## 2019-10-16 MED ORDER — MAGIC MOUTHWASH
10.0000 mL | Freq: Three times a day (TID) | ORAL | Status: DC
  Administered 2019-10-16 – 2019-10-20 (×9): 10 mL via ORAL
  Filled 2019-10-16 (×24): qty 10

## 2019-10-16 MED ORDER — PROMETHAZINE HCL 25 MG PO TABS
25.0000 mg | ORAL_TABLET | ORAL | Status: DC | PRN
  Administered 2019-10-16 – 2019-10-20 (×16): 25 mg via ORAL
  Filled 2019-10-16 (×18): qty 1

## 2019-10-16 MED ORDER — VANCOMYCIN VARIABLE DOSE PER UNSTABLE RENAL FUNCTION (PHARMACIST DOSING): Status: DC

## 2019-10-16 MED ORDER — LIDOCAINE VISCOUS HCL 2 % MT SOLN
10.0000 mL | Freq: Three times a day (TID) | OROMUCOSAL | Status: DC
  Administered 2019-10-16 – 2019-10-20 (×8): 10 mL via OROMUCOSAL
  Filled 2019-10-16: qty 15
  Filled 2019-10-16: qty 10
  Filled 2019-10-16 (×3): qty 15
  Filled 2019-10-16: qty 10
  Filled 2019-10-16 (×2): qty 100
  Filled 2019-10-16: qty 15
  Filled 2019-10-16 (×2): qty 100
  Filled 2019-10-16: qty 15
  Filled 2019-10-16: qty 10
  Filled 2019-10-16: qty 100
  Filled 2019-10-16: qty 15

## 2019-10-16 MED ORDER — PROMETHAZINE HCL 25 MG PO TABS
25.0000 mg | ORAL_TABLET | Freq: Four times a day (QID) | ORAL | Status: DC | PRN
  Administered 2019-10-16: 10:00:00 25 mg via ORAL
  Filled 2019-10-16 (×2): qty 1

## 2019-10-16 MED ORDER — ENOXAPARIN SODIUM 40 MG/0.4ML ~~LOC~~ SOLN
40.0000 mg | SUBCUTANEOUS | Status: DC
  Administered 2019-10-16 – 2019-10-20 (×5): 40 mg via SUBCUTANEOUS
  Filled 2019-10-16 (×5): qty 0.4

## 2019-10-16 MED ORDER — VANCOMYCIN HCL 500 MG/100ML IV SOLN
500.0000 mg | Freq: Once | INTRAVENOUS | Status: AC
  Administered 2019-10-16: 500 mg via INTRAVENOUS
  Filled 2019-10-16 (×2): qty 100

## 2019-10-16 MED ORDER — TRAMADOL HCL 50 MG PO TABS
50.0000 mg | ORAL_TABLET | Freq: Four times a day (QID) | ORAL | Status: DC | PRN
  Administered 2019-10-16 – 2019-10-20 (×11): 50 mg via ORAL
  Filled 2019-10-16 (×11): qty 1

## 2019-10-16 MED ORDER — PANTOPRAZOLE SODIUM 40 MG PO TBEC
40.0000 mg | DELAYED_RELEASE_TABLET | Freq: Every day | ORAL | Status: DC
  Administered 2019-10-16 – 2019-10-17 (×2): 40 mg via ORAL
  Filled 2019-10-16 (×3): qty 1

## 2019-10-16 MED ORDER — CHLORHEXIDINE GLUCONATE CLOTH 2 % EX PADS
6.0000 | MEDICATED_PAD | Freq: Every day | CUTANEOUS | Status: DC
  Administered 2019-10-16 – 2019-10-20 (×5): 6 via TOPICAL

## 2019-10-16 NOTE — Progress Notes (Signed)
Pharmacy Antibiotic Note  Sarah [name] is a 64 y.o. female admitted on 10/15/2019 with febrile neutropenia.  Pharmacy has been consulted for Vancomycin dosing.  Pt has received 1gm in ED  Plan: Vancomycin 500mg  x 1 to make a total loading dose of 1500mg  then Variable dosing due to poor renal fxn, will dose intermittently based on levels & renal fxn  Height: 5\' 2"  (157.5 cm) Weight: 68 kg (149 lb 14.6 oz) IBW/kg (Calculated) : 50.1  Temp (24hrs), Avg:99.2 F (37.3 C), Min:98.5 F (36.9 C), Max:100.1 F (37.8 C)  Recent Labs  Lab 10/11/19 0827 10/15/19 1557 10/15/19 1558 10/15/19 1759  WBC 3.4* 1.8*  --  1.7*  CREATININE 1.38* 1.60*  --  1.63*  LATICACIDVEN  --   --  1.0 0.9    Estimated Creatinine Clearance: 32 mL/min (A) (by C-G formula based on SCr of 1.63 mg/dL (H)).    Allergies  Allergen Reactions  . Oxycodone-Acetaminophen Anaphylaxis    Swelling and rash  . Celebrex [Celecoxib] Diarrhea  . Codeine   . Benadryl [Diphenhydramine] Palpitations  . Morphine Itching and Rash  . Ondansetron Diarrhea  . Tylenol [Acetaminophen] Itching and Rash    Antimicrobials this admission:   >>    >>   Dose adjustments this admission:   Microbiology results:  BCx:   UCx:    Sputum:    MRSA PCR:   Thank you for allowing pharmacy to be a part of this patient's care.  Hart Robinsons A 10/16/2019 1:15 AM

## 2019-10-16 NOTE — Consult Note (Signed)
[place] NOTE  Patient Care Team: Glean Hess, MD as PCP - General (Internal Medicine) Cammie Sickle, MD as Consulting Physician (Oncology) Jodell Cipro, MD as Referring Physician (Pain Medicine) Corey Skains, MD as Consulting Physician (Cardiology)  CHIEF COMPLAINTS/PURPOSE OF CONSULTATION: Multiple myeloma/neutropenic fever  HISTORY OF PRESENTING ILLNESS:  Sarah [name] 64 y.o.  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  Patient currently on Pomalyst; dexamethasone daratumumab-cycle number 1 day #20.  Patient admitted to hospital for worsening fatigue; diarrhea [prior lactulose use]; and fever of 101.  Patient was seen in the clinic yesterday by primary oncologist Dr. Mike Gip.  Patient ANc was 800/given the fevers emergency room for admission.  UA negative; chest x-ray negative.  Blood cultures pending.  Patient is on broad-spectrum antibiotics-cefepime vancomycin/Flagyl. Patient also noted to have rash on on the hands for the last 1 week.  Mildly itchy.   Since being in the hospital since patient feels improved.  However continues to have nausea.  One episode of loose stool today.  Seems to be getting get better.  Review of Systems  Constitutional: Positive for fever and malaise/fatigue. Negative for chills, diaphoresis and weight loss.  HENT: Negative for nosebleeds and sore throat.   Eyes: Negative for double vision.  Respiratory: Negative for cough, hemoptysis, sputum production, shortness of breath and wheezing.   Cardiovascular: Negative for chest pain, palpitations, orthopnea and leg swelling.  Gastrointestinal: Positive for diarrhea and nausea. Negative for abdominal pain, blood in stool, constipation, heartburn, melena and vomiting.  Genitourinary: Negative for dysuria, frequency and urgency.  Musculoskeletal: Positive for back pain and  joint pain.  Skin: Positive for itching and rash.  Neurological: Negative for dizziness, tingling, focal weakness, weakness and headaches.  Endo/Heme/Allergies: Does not bruise/bleed easily.  Psychiatric/Behavioral: Negative for depression. The patient is not nervous/anxious and does not have insomnia.      MEDICAL HISTORY:  Past Medical History:  Diagnosis Date  . Abnormal stress test 02/14/2016   Overview:  Added automatically from request for surgery [id number]  . Anemia   . Anxiety   . Arthritis   . Bicuspid aortic valve   . CHF (congestive heart failure) ([place])   . CKD (chronic kidney disease) stage 3, GFR 30-59 ml/min   . Depression   . Diabetes mellitus ([place])   . Dizziness   . Fatty liver   . Frequent falls   . GERD (gastroesophageal reflux disease)   . Gout   . Heart murmur   . History of blood transfusion   . History of bone marrow transplant ([place])   . History of uterine fibroid   . Hx of cardiac catheterization 06/05/2016   Overview:  Normal coronaries 2017  . Hypertension   . Hypomagnesemia   . Multiple myeloma ([place])   . Personal history of chemotherapy   . Renal cyst     SURGICAL HISTORY: Past Surgical History:  Procedure Laterality Date  . ABDOMINAL HYSTERECTOMY    . 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 CARDIAC ELECTROPHYSIOLOGY MAPPING AND ABLATION    . CARPAL TUNNEL RELEASE Bilateral   . CHOLECYSTECTOMY  2008  . COLONOSCOPY WITH PROPOFOL N/A 05/07/2017   Procedure: COLONOSCOPY WITH PROPOFOL;  Surgeon: [name], MD;  Location: [facility] ENDOSCOPY;  Service: Gastroenterology;  Laterality: N/A;  . ESOPHAGOGASTRODUODENOSCOPY (EGD) WITH PROPOFOL N/A 05/07/2017   Procedure: ESOPHAGOGASTRODUODENOSCOPY (EGD) WITH PROPOFOL;  Surgeon: [name],  MD;  Location: [facility] ENDOSCOPY;  Service: Gastroenterology;  Laterality: N/A;  . FOOT SURGERY Bilateral   . INCONTINENCE SURGERY  2009  . INTERSTIM IMPLANT PLACEMENT    . other     over active bladder  . OTHER SURGICAL  HISTORY     bladder stimulator   . PARTIAL HYSTERECTOMY  03/1996   fibroids  . PORTA CATH INSERTION N/A 03/10/2019   Procedure: PORTA CATH INSERTION;  Surgeon: Algernon Huxley, MD;  Location: [facility];  Service: Cardiovascular;  Laterality: N/A;  . TONSILLECTOMY  2007    SOCIAL HISTORY: Social History   Socioeconomic History  . Marital status: Widowed    Spouse name: Not on file  . Number of children: Not on file  . Years of education: Not on file  . Highest education level: Not on file  Occupational History  . Occupation: Disabled  Tobacco Use  . Smoking status: Former Smoker    Packs/day: 1.00    Years: 20.00    Pack years: 20.00    Types: Cigarettes    Quit date: 07/02/1991    Years since quitting: 28.3  . Smokeless tobacco: Never Used  Substance and Sexual Activity  . Alcohol use: Not Currently  . Drug use: No  . Sexual activity: Never  Other Topics Concern  . Not on file  Social History Narrative  . Not on file   Social Determinants of Health   Financial Resource Strain:   . Difficulty of Paying Living Expenses:   Food Insecurity:   . Worried About Charity fundraiser in the Last Year:   . Arboriculturist in the Last Year:   Transportation Needs:   . Film/video editor (Medical):   Marland Kitchen Lack of Transportation (Non-Medical):   Physical Activity:   . Days of Exercise per Week:   . Minutes of Exercise per Session:   Stress:   . Feeling of Stress :   Social Connections:   . Frequency of Communication with Friends and Family:   . Frequency of Social Gatherings with Friends and Family:   . Attends Religious Services:   . Active Member of Clubs or Organizations:   . Attends Archivist Meetings:   Marland Kitchen Marital Status:   Intimate Partner Violence:   . Fear of Current or Ex-Partner:   . Emotionally Abused:   Marland Kitchen Physically Abused:   . Sexually Abused:     FAMILY HISTORY: Family History  Problem Relation Age of Onset  . Colon cancer Father    . Renal Disease Father   . Diabetes Mellitus II Father   . Melanoma Paternal Grandmother   . Breast cancer Maternal Aunt 74  . Anemia Mother   . Heart disease Mother   . Heart failure Mother   . Renal Disease Mother   . Congestive Heart Failure Mother   . Heart disease Maternal Uncle   . Throat cancer Maternal Uncle   . Lung cancer Maternal Uncle   . Liver disease Maternal Uncle   . Heart failure Maternal Uncle   . Hearing loss Son 18       Suicide     ALLERGIES:  is allergic to oxycodone-acetaminophen; celebrex [celecoxib]; codeine; benadryl [diphenhydramine]; morphine; ondansetron; and tylenol [acetaminophen].  MEDICATIONS:  Current Facility-Administered Medications  Medication Dose Route Frequency Provider Last Rate Last Admin  . 0.9 %  sodium chloride infusion   Intravenous Continuous Athena Masse, MD 75 mL/hr at 10/16/19 0229 New  Bag at 10/16/19 0229  . ceFEPIme (MAXIPIME) 2 g in sodium chloride 0.9 % 100 mL IVPB  2 g Intravenous Q12H Hall, Scott A, RPH 200 mL/hr at 10/16/19 0839 2 g at 10/16/19 0839  . enoxaparin (LOVENOX) injection 40 mg  40 mg Subcutaneous Q24H Charlaine Dalton R, MD      . ibuprofen (ADVIL) tablet 400 mg  400 mg Oral Q6H PRN Athena Masse, MD   400 mg at 10/16/19 0953  . insulin aspart (novoLOG) injection 0-9 Units  0-9 Units Subcutaneous TID WC Athena Masse, MD   1 Units at 10/16/19 [phone]  . magic mouthwash  10 mL Oral TID Enzo Bi, MD       And  . lidocaine (XYLOCAINE) 2 % viscous mouth solution 10 mL  10 mL Mouth/Throat TID Enzo Bi, MD   10 mL at 10/16/19 0955  . promethazine (PHENERGAN) tablet 25 mg  25 mg Oral Q6H PRN Enzo Bi, MD   25 mg at 10/16/19 0946  . Tbo-Filgrastim (GRANIX) injection 480 mcg  480 mcg Subcutaneous Daily Charlaine Dalton R, MD      . vancomycin variable dose per unstable renal function (pharmacist dosing)   Does not apply See admin instructions Ena Dawley, [place]       Facility-Administered Medications Ordered  in Other Encounters  Medication Dose Route Frequency Provider Last Rate Last Admin  . sodium chloride flush (NS) 0.9 % injection 10 mL  10 mL Intravenous PRN Lequita Asal, MD   10 mL at 10/15/19 1618      .  PHYSICAL EXAMINATION:  Vitals:   10/16/19 0156 10/16/19 0804  BP: 113/61 103/65  Pulse: 71 63  Resp: 16 16  Temp: 98.7 F (37.1 C) 99.1 F (37.3 C)  SpO2: 99% 100%   Filed Weights   10/15/19 1749  Weight: 149 lb 14.6 oz (68 kg)    Physical Exam  Constitutional: She is oriented to person, place, and time and well-developed, well-nourished, and in no distress.  Appears fatigued.  Alone.  HENT:  Head: Normocephalic and atraumatic.  Mouth/Throat: Oropharynx is clear and moist. No oropharyngeal exudate.  Eyes: Pupils are equal, round, and reactive to light.  Cardiovascular: Normal rate and regular rhythm.  Pulmonary/Chest: Effort normal and breath sounds normal. No respiratory distress. She has no wheezes.  Abdominal: Soft. Bowel sounds are normal. She exhibits no distension and no mass. There is no abdominal tenderness. There is no rebound and no guarding.  Musculoskeletal:        General: No tenderness or edema. Normal range of motion.     Cervical back: Normal range of motion and neck supple.  Neurological: She is alert and oriented to person, place, and time.  Skin: Skin is warm.  Macular papular rash noted on the extensor surface of hands/in between the digits.  Psychiatric: Affect normal.     LABORATORY DATA:  I have reviewed the data as listed Lab Results  Component Value Date   WBC 1.3 (LL) 10/16/2019   WBC 1.3 (LL) 10/16/2019   HGB 9.3 (L) 10/16/2019   HGB 9.2 (L) 10/16/2019   HCT 27.0 (L) 10/16/2019   HCT 26.8 (L) 10/16/2019   MCV 89.4 10/16/2019   MCV 91.2 10/16/2019   PLT 74 (L) 10/16/2019   PLT 70 (L) 10/16/2019   Recent Labs    10/11/19 0827 10/11/19 0827 10/15/19 1557 10/15/19 1759 10/16/19 0500  NA 135   < > 132* 134* 137  K  4.3   < > 4.2 4.2 4.3  CL 103   < > 102 103 110  CO2 22   < > 22 23 21*  GLUCOSE 165*   < > 107* 111* 128*  BUN 35*   < > 52* 48* 34*  CREATININE 1.38*   < > 1.60* 1.63* 1.08*  CALCIUM 8.6*   < > 8.1* 8.3* 7.5*  GFRNONAA 41*   < > 34* 33* 55*  GFRAA 47*   < > 39* 38* >60  PROT 6.1*  --  6.5 6.4*  --   ALBUMIN 3.7  --  3.8 3.9  --   AST 20  --  17 16  --   ALT 25  --  29 30  --   ALKPHOS 82  --  76 70  --   BILITOT 0.4  --  0.5 0.8  --    < > = values in this interval not displayed.    RADIOGRAPHIC STUDIES: I have personally reviewed the radiological images as listed and agreed with the findings in the report. DG Chest 2 View  Result Date: 10/15/2019 CLINICAL DATA:  Fever. EXAM: CHEST - 2 VIEW COMPARISON:  Radiograph 01/04/2019 FINDINGS: Right chest port with tip in the SVC.The cardiomediastinal contours are normal. The lungs are clear. Pulmonary vasculature is normal. No consolidation, pleural effusion, or pneumothorax. No acute osseous abnormalities are seen. IMPRESSION: No acute chest findings. Electronically Signed   By: Keith Rake M.D.   On: 10/15/2019 18:41   DG Chest 2 View  Result Date: 10/15/2019 CLINICAL DATA:  Fever. Multiple myeloma on oral chemotherapy. Fever, chills and fatigue. EXAM: CHEST - 2 VIEW COMPARISON:  Radiograph 2 hours prior. FINDINGS: Right chest port with tip in the lower SVC.The cardiomediastinal contours are normal. The lungs are clear. Pulmonary vasculature is normal. No consolidation, pleural effusion, or pneumothorax. No acute osseous abnormalities are seen. Degenerative change in the thoracic spine. IMPRESSION: No acute chest findings or evidence of pneumonia. Electronically Signed   By: Keith Rake M.D.   On: 10/15/2019 18:41    Multiple myeloma not having achieved remission [facility]) # 64 year old female patient with history of multiple myeloma currently on Pomalyst dexamethasone daratumumab currently admitted to hospital for neutropenic  fever/acute renal failure  #Lambda light chain multiple myeloma relapsed-currently on Pomalyst dexamethasone daratumumab.  Hold awaiting resolution of acute issues.  #Neutropenic fever-neutropenia likely secondary to Pomalyst; less likely daratumumab. Add Granix for ANC recovery.   Currently on hold.  The etiology of fever is unclear.  Currently improved in the hospital.  Work-up negative so far.  Awaiting blood cultures.  Continue cefepime vancomycin.  Defer to primary team regarding Flagyl.  #Anemia thrombocytopenia-multifactorial underlying multiple myeloma/treatment.  Platelets 70s.  Monitor closely.  # Acute kidney injury-creatinine 1.6 at presentation; baseline 1.2.  S/p IV hydration improved.  # Oral mucositis- from Pomalyst- recommend magic mouth wash.   #DVT prophylaxis-Lovenox 40 mg subcu daily [hold if platelets less than 50,K]  Thank you Dr.Lai for allowing me to participate in the care of your pleasant patient. Please do not hesitate to contact me with questions or concerns in the interim.     All questions were answered. The patient knows to call the clinic with any problems, questions or concerns.       Cammie Sickle, MD 10/16/2019 10:36 AM

## 2019-10-16 NOTE — ED Notes (Signed)
Report finished att, pt now going to 141 - no transport att

## 2019-10-16 NOTE — ED Notes (Signed)
Admitting Provider at bedside. 

## 2019-10-16 NOTE — ED Notes (Signed)
Pt given snacks per request; reports still nauseated but wants to try to eat

## 2019-10-16 NOTE — Assessment & Plan Note (Addendum)
#  64 year old female patient with history of multiple myeloma currently on Pomalyst dexamethasone daratumumab currently admitted to hospital for neutropenic fever/acute renal failure  #Lambda light chain multiple myeloma relapsed-currently on Pomalyst dexamethasone daratumumab.  Hold awaiting resolution of acute issues.  #Neutropenic fever-neutropenia likely secondary to Pomalyst; less likely daratumumab. Add Granix for ANC recovery.   Currently on hold.  The etiology of fever is unclear.  Currently improved in the hospital.  Work-up negative so far.  Awaiting blood cultures.  Continue cefepime vancomycin.  Defer to primary team regarding Flagyl.  #Anemia thrombocytopenia-multifactorial underlying multiple myeloma/treatment.  Platelets 70s.  Monitor closely.  # Acute kidney injury-creatinine 1.6 at presentation; baseline 1.2.  S/p IV hydration improved.  # Oral mucositis- from Pomalyst- recommend magic mouth wash.   #DVT prophylaxis-Lovenox 40 mg subcu daily [hold if platelets less than 50,K]  Thank you Dr.Lai for allowing me to participate in the care of your pleasant patient. Please do not hesitate to contact me with questions or concerns in the interim.

## 2019-10-16 NOTE — Progress Notes (Signed)
PROGRESS NOTE    Raiven Belizaire  ZOX:[id number] DOB: February 17, 1956 DOA: 10/15/2019 PCP: Glean Hess, MD    Assessment & Plan:   Principal Problem:   Neutropenic fever ([place]) Active Problems:   Multiple myeloma not having achieved remission ([place])   Type II diabetes mellitus with complication (HCC)   Chronic diastolic CHF (congestive heart failure), NYHA class 2 (HCC)   Mild aortic valve stenosis   Benign essential HTN   Stage 3 chronic kidney disease   AKI (acute kidney injury) ([place])   Febrile neutropenia (HCC)    Nyaisha Simao is a 64 y.o. female with medical history significant for stage III multiple myeloma on chemotherapy, diabetes, hypertension, CKD 3, mild aortic stenosis, diastolic heart failure and history of a flutter status post ablation in 1999, who presented to her oncologist office, Dr. Mike Gip earlier today with a complaint of fever of 101.3, chills diarrhea and rash.  She was apparently constipated on the day prior and took lactulose but then developed watery diarrhea.    Neutropenic fever ([place])   Multiple myeloma not having achieved remission (HCC) -ANC of 800/WBC 1800 on presentation, trending down.  Temperature at home was 101.3.  At primary oncologist office lactic acid 1.0. -Source uncertain.  Patient has diarrhea but this started after lactulose for constipation -started on IV cefepime, vancomycin and metronidazole --s/p IV hydration --Oncology consulted PLAN: --continue cefepime, d/c vanc, per pharm rec -Follow cultures --Trend ANC    AKI (acute kidney injury), resolved  superimposed on CKD 3 ([place]) -Creatinine 1.63 above baseline of 1.13 -Suspect related to dehydration from watery diarrhea and poor oral intake --resolved with IV hydration    Type II diabetes mellitus with complication (HCC) -W1X 6.4, no need for fingersticks or SSI    Chronic diastolic CHF (congestive heart failure), NYHA class 2 (HCC) -Currently euvolemic.  Last EF 50%  and October 2020 -Intake and output monitoring    Mild aortic valve stenosis -No acute concerns but monitor in view of IV hydration -Has been seen by Dr. Nehemiah Massed in the past    Benign essential HTN -Blood pressure soft so we will hold home antihypertensives   DVT prophylaxis: Lovenox SQ Code Status: Full code  Family Communication:  Disposition Plan: Home, when oncology clears.   Subjective and Interval History:  Pt reported nausea but no vomiting.  Has alternating diarrhea and constipation which is baseline.  Mild burning with urination.  Chronic mild sore throat.  No fever, dyspnea, chest pain, abdominal pain, increased swelling.   Objective: Vitals:   10/16/19 0000 10/16/19 0156 10/16/19 0804 10/16/19 1617  BP: 109/65 113/61 103/65 122/69  Pulse: 63 71 63 66  Resp: 14 16 16    Temp:  98.7 F (37.1 C) 99.1 F (37.3 C) 98.8 F (37.1 C)  TempSrc:  Oral Oral Oral  SpO2: 98% 99% 100% 100%  Weight:      Height:        Intake/Output Summary (Last 24 hours) at 10/16/2019 1711 Last data filed at 10/16/2019 0900 Gross per 24 hour  Intake 3370 ml  Output 400 ml  Net 2970 ml   Filed Weights   10/15/19 1749  Weight: 68 kg    Examination:   Constitutional: NAD, AAOx3 HEENT: conjunctivae and lids normal, EOMI CV: RRR 3+ systolic murmur at upper sternal boarder.  Distal pulses +2.  No cyanosis.   RESP: CTA B/L, normal respiratory effort  GI: +BS, NTND Extremities: No effusions, edema, or tenderness in BLE SKIN: warm,  dry and intact Neuro: II - XII grossly intact.  Sensation intact Psych: Normal mood and affect.  Appropriate judgement and reason   Data Reviewed: I have personally reviewed following labs and imaging studies  CBC: Recent Labs  Lab 10/11/19 0827 10/15/19 1557 10/15/19 1759 10/16/19 0500  WBC 3.4* 1.8* 1.7* 1.3*  1.3*  NEUTROABS 2.4 0.8* 0.6* 0.4*  HGB 9.9* 10.0* 10.2* 9.2*  9.3*  HCT 28.9* 28.8* 29.1* 26.8*  27.0*  MCV 89.8 89.2 88.7 91.2   89.4  PLT 80* 86* 91* 70*  74*   Basic Metabolic Panel: Recent Labs  Lab 10/11/19 0827 10/14/19 0903 10/15/19 1557 10/15/19 1759 10/16/19 0500  NA 135  --  132* 134* 137  K 4.3  --  4.2 4.2 4.3  CL 103  --  102 103 110  CO2 22  --  22 23 21*  GLUCOSE 165*  --  107* 111* 128*  BUN 35*  --  52* 48* 34*  CREATININE 1.38*  --  1.60* 1.63* 1.08*  CALCIUM 8.6*  --  8.1* 8.3* 7.5*  MG 1.1* 1.4* 2.2  --   --    GFR: Estimated Creatinine Clearance: 48.2 mL/min (A) (by C-G formula based on SCr of 1.08 mg/dL (H)). Liver Function Tests: Recent Labs  Lab 10/11/19 0827 10/15/19 1557 10/15/19 1759  AST 20 17 16   ALT 25 29 30   ALKPHOS 82 76 70  BILITOT 0.4 0.5 0.8  PROT 6.1* 6.5 6.4*  ALBUMIN 3.7 3.8 3.9   No results for input(s): LIPASE, AMYLASE in the last 168 hours. No results for input(s): AMMONIA in the last 168 hours. Coagulation Profile: No results for input(s): INR, PROTIME in the last 168 hours. Cardiac Enzymes: No results for input(s): CKTOTAL, CKMB, CKMBINDEX, TROPONINI in the last 168 hours. BNP (last 3 results) No results for input(s): PROBNP in the last 8760 hours. HbA1C: Recent Labs    10/16/19 0500  HGBA1C 6.4*   CBG: Recent Labs  Lab 10/16/19 1225 10/16/19 1635 10/16/19 1651  GLUCAP 104* 136* 161*   Lipid Profile: No results for input(s): CHOL, HDL, LDLCALC, TRIG, CHOLHDL, LDLDIRECT in the last 72 hours. Thyroid Function Tests: No results for input(s): TSH, T4TOTAL, FREET4, T3FREE, THYROIDAB in the last 72 hours. Anemia Panel: No results for input(s): VITAMINB12, FOLATE, FERRITIN, TIBC, IRON, RETICCTPCT in the last 72 hours. Sepsis Labs: Recent Labs  Lab 10/15/19 1558 10/15/19 1759  PROCALCITON  --  0.24  LATICACIDVEN 1.0 0.9    Recent Results (from the past 240 hour(s))  Culture, blood (single) w Reflex to ID Panel     Status: None (Preliminary result)   Collection Time: 10/15/19  3:58 PM   Specimen: BLOOD LEFT HAND  Result Value Ref  Range Status   Specimen Description   Final    BLOOD LEFT HAND Performed at [facility], [place] [address], [place], [place] [address]    Special Requests   Final    BOTTLES DRAWN AEROBIC AND ANAEROBIC Blood Culture adequate volume Performed at [facility], [place] [address], [place], [place] [address]    Culture   Final    NO GROWTH < 24 HOURS Performed at [facility], [address] [place], [place] [address]    Report Status PENDING  Incomplete  Culture, blood (single) w Reflex to ID Panel     Status: None (Preliminary result)   Collection Time: 10/15/19  3:58 PM   Specimen: BLOOD  Result  Value Ref Range Status   Specimen Description   Final    BLOOD PORTA CATH Performed at [facility], [address], [place], [place] [address]    Special Requests   Final    BOTTLES DRAWN AEROBIC AND ANAEROBIC Blood Culture adequate volume Performed at [facility], [place] [address] [place], [place] [address]    Culture   Final    NO GROWTH < 24 HOURS Performed at [facility], [address] [place], [place] [address]    Report Status PENDING  Incomplete  Respiratory Panel by RT PCR (Flu A&B, Covid) - Nasopharyngeal Swab     Status: None   Collection Time: 10/15/19 10:37 PM   Specimen: Nasopharyngeal Swab  Result Value Ref Range Status   SARS Coronavirus 2 by RT PCR NEGATIVE NEGATIVE Final    Comment: (NOTE) SARS-CoV-2 target nucleic acids are NOT DETECTED. The SARS-CoV-2 RNA is generally detectable in upper respiratoy specimens during the acute phase of infection. The lowest concentration of SARS-CoV-2 viral copies this assay can detect is 131 copies/mL. A negative result does not preclude SARS-Cov-2 infection and should not be used as the sole basis for treatment or other patient management decisions. A negative result may occur with  improper specimen collection/handling, submission of specimen other than nasopharyngeal swab, presence of  viral mutation(s) within the areas targeted by this assay, and inadequate number of viral copies (<131 copies/mL). A negative result must be combined with clinical observations, patient history, and epidemiological information. The expected result is Negative. Fact Sheet for Patients:  PinkCheek.be Fact Sheet for Healthcare Providers:  GravelBags.it This test is not yet ap proved or cleared by the [place] FDA and  has been authorized for detection and/or diagnosis of SARS-CoV-2 by FDA under an Emergency Use Authorization (EUA). This EUA will remain  in effect (meaning this test can be used) for the duration of the COVID-19 declaration under Section 564(b)(1) of the Act, 21 U.S.C. section 360bbb-3(b)(1), unless the authorization is terminated or revoked sooner.    Influenza A by PCR NEGATIVE NEGATIVE Final   Influenza B by PCR NEGATIVE NEGATIVE Final    Comment: (NOTE) The Xpert Xpress SARS-CoV-2/FLU/RSV assay is intended as an aid in  the diagnosis of influenza from Nasopharyngeal swab specimens and  should not be used as a sole basis for treatment. Nasal washings and  aspirates are unacceptable for Xpert Xpress SARS-CoV-2/FLU/RSV  testing. Fact Sheet for Patients: PinkCheek.be Fact Sheet for Healthcare Providers: GravelBags.it This test is not yet approved or cleared by the [place] FDA and  has been authorized for detection and/or diagnosis of SARS-CoV-2 by  FDA under an Emergency Use Authorization (EUA). This EUA will remain  in effect (meaning this test can be used) for the duration of the  Covid-19 declaration under Section 564(b)(1) of the Act, 21  U.S.C. section 360bbb-3(b)(1), unless the authorization is  terminated or revoked. Performed at [facility], [address] [place], [place] [address]       Radiology Studies: DG Chest 2  View  Result Date: 10/15/2019 CLINICAL DATA:  Fever. EXAM: CHEST - 2 VIEW COMPARISON:  Radiograph 01/04/2019 FINDINGS: Right chest port with tip in the SVC.The cardiomediastinal contours are normal. The lungs are clear. Pulmonary vasculature is normal. No consolidation, pleural effusion, or pneumothorax. No acute osseous abnormalities are seen. IMPRESSION: No acute chest findings. Electronically Signed   By: Keith Rake M.D.   On: 10/15/2019 18:41  DG Chest 2 View  Result Date: 10/15/2019 CLINICAL DATA:  Fever. Multiple myeloma on oral chemotherapy. Fever, chills and fatigue. EXAM: CHEST - 2 VIEW COMPARISON:  Radiograph 2 hours prior. FINDINGS: Right chest port with tip in the lower SVC.The cardiomediastinal contours are normal. The lungs are clear. Pulmonary vasculature is normal. No consolidation, pleural effusion, or pneumothorax. No acute osseous abnormalities are seen. Degenerative change in the thoracic spine. IMPRESSION: No acute chest findings or evidence of pneumonia. Electronically Signed   By: Keith Rake M.D.   On: 10/15/2019 18:41     Scheduled Meds: . Chlorhexidine Gluconate Cloth  6 each Topical Daily  . enoxaparin (LOVENOX) injection  40 mg Subcutaneous Q24H  . insulin aspart  0-9 Units Subcutaneous TID WC  . magic mouthwash  10 mL Oral TID   And  . lidocaine  10 mL Mouth/Throat TID  . Tbo-filgastrim (GRANIX) SQ  480 mcg Subcutaneous Daily   Continuous Infusions: . ceFEPime (MAXIPIME) IV 2 g (10/16/19 0839)     LOS: 1 day     Enzo Bi, MD [facility] If 7PM-7AM, please contact night-coverage 10/16/2019, 5:11 PM

## 2019-10-17 LAB — MAGNESIUM: Magnesium: 1.6 mg/dL — ABNORMAL LOW (ref 1.7–2.4)

## 2019-10-17 LAB — CBC WITH DIFFERENTIAL/PLATELET
Abs Immature Granulocytes: 0.01 10*3/uL (ref 0.00–0.07)
Basophils Absolute: 0 10*3/uL (ref 0.0–0.1)
Basophils Relative: 1 %
Eosinophils Absolute: 0.2 10*3/uL (ref 0.0–0.5)
Eosinophils Relative: 9 %
HCT: 28 % — ABNORMAL LOW (ref 36.0–46.0)
Hemoglobin: 9.4 g/dL — ABNORMAL LOW (ref 12.0–15.0)
Immature Granulocytes: 1 %
Lymphocytes Relative: 29 %
Lymphs Abs: 0.6 10*3/uL — ABNORMAL LOW (ref 0.7–4.0)
MCH: 30.1 pg (ref 26.0–34.0)
MCHC: 33.6 g/dL (ref 30.0–36.0)
MCV: 89.7 fL (ref 80.0–100.0)
Monocytes Absolute: 0.4 10*3/uL (ref 0.1–1.0)
Monocytes Relative: 23 %
Neutro Abs: 0.7 10*3/uL — ABNORMAL LOW (ref 1.7–7.7)
Neutrophils Relative %: 37 %
Platelets: 78 10*3/uL — ABNORMAL LOW (ref 150–400)
RBC: 3.12 MIL/uL — ABNORMAL LOW (ref 3.87–5.11)
RDW: 13.8 % (ref 11.5–15.5)
Smear Review: DECREASED
WBC: 1.9 10*3/uL — ABNORMAL LOW (ref 4.0–10.5)
nRBC: 0 % (ref 0.0–0.2)

## 2019-10-17 LAB — URINE CULTURE
Culture: <10000 — AB
Culture: <10000 — AB

## 2019-10-17 LAB — BASIC METABOLIC PANEL
Anion gap: 7 (ref 5–15)
BUN: 18 mg/dL (ref 8–23)
CO2: 22 mmol/L (ref 22–32)
Calcium: 8.4 mg/dL — ABNORMAL LOW (ref 8.9–10.3)
Chloride: 108 mmol/L (ref 98–111)
Creatinine, Ser: 1.05 mg/dL — ABNORMAL HIGH (ref 0.44–1.00)
GFR calc Af Amer: >60 mL/min (ref >60)
GFR calc non Af Amer: 56 mL/min — ABNORMAL LOW (ref >60)
Glucose, Bld: 103 mg/dL — ABNORMAL HIGH (ref 70–99)
Potassium: 4.4 mmol/L (ref 3.5–5.1)
Sodium: 137 mmol/L (ref 135–145)

## 2019-10-17 MED ORDER — METRONIDAZOLE IN NACL 5-0.79 MG/ML-% IV SOLN
500.0000 mg | Freq: Three times a day (TID) | INTRAVENOUS | Status: DC
  Administered 2019-10-17 – 2019-10-19 (×5): 500 mg via INTRAVENOUS
  Filled 2019-10-17 (×8): qty 100

## 2019-10-17 MED ORDER — PRAVASTATIN SODIUM 20 MG PO TABS
20.0000 mg | ORAL_TABLET | Freq: Every day | ORAL | Status: DC
  Administered 2019-10-17 – 2019-10-20 (×4): 20 mg via ORAL
  Filled 2019-10-17 (×4): qty 1

## 2019-10-17 MED ORDER — FLUOXETINE HCL 20 MG PO CAPS
40.0000 mg | ORAL_CAPSULE | Freq: Every day | ORAL | Status: DC
  Administered 2019-10-17 – 2019-10-20 (×4): 40 mg via ORAL
  Filled 2019-10-17 (×4): qty 2

## 2019-10-17 MED ORDER — ACYCLOVIR 200 MG PO CAPS
400.0000 mg | ORAL_CAPSULE | Freq: Two times a day (BID) | ORAL | Status: DC
  Administered 2019-10-17 – 2019-10-20 (×7): 400 mg via ORAL
  Filled 2019-10-17 (×8): qty 2

## 2019-10-17 MED ORDER — ASPIRIN 81 MG PO CHEW
81.0000 mg | CHEWABLE_TABLET | Freq: Every day | ORAL | Status: DC
  Administered 2019-10-17 – 2019-10-20 (×4): 81 mg via ORAL
  Filled 2019-10-17 (×4): qty 1

## 2019-10-17 MED ORDER — VANCOMYCIN HCL IN DEXTROSE 1-5 GM/200ML-% IV SOLN
1000.0000 mg | INTRAVENOUS | Status: DC
  Administered 2019-10-18: 20:00:00 1000 mg via INTRAVENOUS
  Filled 2019-10-17 (×2): qty 200

## 2019-10-17 MED ORDER — PANTOPRAZOLE SODIUM 40 MG PO TBEC
40.0000 mg | DELAYED_RELEASE_TABLET | Freq: Every day | ORAL | Status: DC
  Administered 2019-10-18 – 2019-10-20 (×3): 40 mg via ORAL
  Filled 2019-10-17 (×3): qty 1

## 2019-10-17 MED ORDER — TRAZODONE HCL 100 MG PO TABS
100.0000 mg | ORAL_TABLET | Freq: Every day | ORAL | Status: DC
  Administered 2019-10-17 – 2019-10-19 (×3): 100 mg via ORAL
  Filled 2019-10-17 (×3): qty 1

## 2019-10-17 MED ORDER — FENTANYL 25 MCG/HR TD PT72
1.0000 | MEDICATED_PATCH | TRANSDERMAL | Status: DC
  Administered 2019-10-17 – 2019-10-20 (×2): 1 via TRANSDERMAL
  Filled 2019-10-17 (×2): qty 1

## 2019-10-17 MED ORDER — VANCOMYCIN HCL 1500 MG/300ML IV SOLN
1500.0000 mg | Freq: Once | INTRAVENOUS | Status: AC
  Administered 2019-10-17: 1500 mg via INTRAVENOUS
  Filled 2019-10-17 (×2): qty 300

## 2019-10-17 MED ORDER — TIZANIDINE HCL 4 MG PO TABS
4.0000 mg | ORAL_TABLET | Freq: Three times a day (TID) | ORAL | Status: DC | PRN
  Administered 2019-10-18 – 2019-10-19 (×4): 4 mg via ORAL
  Filled 2019-10-17 (×6): qty 1

## 2019-10-17 NOTE — Progress Notes (Signed)
Pharmacy Antibiotic Note  Sarah [name] is a 64 y.o. female admitted on 10/15/2019 with febrile neutropenia.  Pharmacy has been consulted for Vancomycin dosing.  Plan: Vancomycin 1500 mg IV X 1 ordered to be given on 4/18 @ ~ 1900. Vancomycin 1 gm IV Q24H ordered to start on 4/19 @ 1900. No peak or trough currently ordered.   AUC = 505.6 T1/2 = 17.2 hrs Ke = 0.04 hr-1 Vd = 49 L  Vanc trough = 13 mcg/mL   Height: 5\' 2"  (157.5 cm) Weight: 68 kg (149 lb 14.6 oz) IBW/kg (Calculated) : 50.1  Temp (24hrs), Avg:100.3 F (37.9 C), Min:99.5 F (37.5 C), Max:101.3 F (38.5 C)  Recent Labs  Lab 10/11/19 0827 10/15/19 1557 10/15/19 1558 10/15/19 1759 10/16/19 0500 10/17/19 0442  WBC 3.4* 1.8*  --  1.7* 1.3*  1.3* 1.9*  CREATININE 1.38* 1.60*  --  1.63* 1.08* 1.05*  LATICACIDVEN  --   --  1.0 0.9  --   --     Estimated Creatinine Clearance: 49.6 mL/min (A) (by C-G formula based on SCr of 1.05 mg/dL (H)).    Allergies  Allergen Reactions  . Oxycodone-Acetaminophen Anaphylaxis    Swelling and rash  . Celebrex [Celecoxib] Diarrhea  . Codeine   . Benadryl [Diphenhydramine] Palpitations  . Morphine Itching and Rash  . Ondansetron Diarrhea  . Tylenol [Acetaminophen] Itching and Rash    Antimicrobials this admission:   >>    >>   Dose adjustments this admission:   Microbiology results:  BCx:   UCx:    Sputum:    MRSA PCR:   Thank you for allowing pharmacy to be a part of this patient's care.  Sarah [name] D 10/17/2019 5:50 PM

## 2019-10-17 NOTE — Progress Notes (Signed)
Oral temp elevated 100.5 at 15:27 and 101.3 at 16:49.  MD notified. Per MD to administer ABX.  MD Wanted to avoid all tylenol and Advil to monitor the frequency and the elevation of temp.

## 2019-10-17 NOTE — Progress Notes (Signed)
PROGRESS NOTE    Sarah [name]  MRN:[id number] DOB: 03/27/1956 DOA: 10/15/2019 PCP: Berglund, Laura H, MD    Assessment & Plan:   Principal Problem:   Neutropenic fever (HCC) Active Problems:   Multiple myeloma not having achieved remission (HCC)   Type II diabetes mellitus with complication (HCC)   Chronic diastolic CHF (congestive heart failure), NYHA class 2 (HCC)   Mild aortic valve stenosis   Benign essential HTN   Stage 3 chronic kidney disease   AKI (acute kidney injury) (HCC)   Febrile neutropenia (HCC)    Sarah [name] is a 64 y.o. female with medical history significant for stage III multiple myeloma on chemotherapy, diabetes, hypertension, CKD 3, mild aortic stenosis, diastolic heart failure and history of a flutter status post ablation in 1999, who presented to her oncologist office, Dr. Corcoran earlier today with a complaint of fever of 101.3, chills diarrhea and rash.  She was apparently constipated on the day prior and took lactulose but then developed watery diarrhea.    Neutropenic fever (HCC)   Multiple myeloma not having achieved remission (HCC) -ANC of 800 on presentation, trending down.  Temperature at home was 101.3.  At primary oncologist office lactic acid 1.0. -Source uncertain.  Patient has diarrhea but this started after lactulose for constipation -started on IV cefepime, vancomycin and metronidazole.  Flagyl was not continued, and Vanc was d/c'ed per pharm rec. --s/p IV hydration --Oncology consulted PLAN: --continue cefepime --resume vanc today due to persistent fever --Add Flagyl due to persistent fever -Follow cultures --Trend ANC --Avoid tylenol or Advil in order to follow fevers closely    AKI (acute kidney injury), resolved  superimposed on CKD 3 (HCC) -Creatinine 1.63 above baseline of 1.13 -Suspect related to dehydration from watery diarrhea and poor oral intake --resolved with IV hydration    Type II diabetes mellitus with  complication (HCC) -A1c 6.4, no need for fingersticks or SSI    Chronic diastolic CHF (congestive heart failure), NYHA class 2 (HCC) -Currently euvolemic.  Last EF 50% and October 2020 -Intake and output monitoring    Mild aortic valve stenosis -No acute concerns but monitor in view of IV hydration -Has been seen by Dr. Kowalski in the past    Benign essential HTN -Blood pressure soft so we will hold home antihypertensives   DVT prophylaxis: Lovenox SQ Code Status: Full code  Family Communication:  Disposition Plan: Home, when oncology clears.  Unclear when, as pt still has neutropenic fevers despite being on cefepime.   Subjective and Interval History:  Pt complained of nausea, brain zap, and feeling shaky.  Pt had a lot of home meds that weren't ordered on admission, which we just realized now.  No dyspnea, dysuria, cough.   Pt continued to spike a fever, despite being cefepime.  Vanc resumed and flagyl added today.   Objective: Vitals:   10/16/19 0804 10/16/19 1617 10/16/19 2343 10/17/19 0721  BP: 103/65 122/69 125/62 134/73  Pulse: 63 66 76 79  Resp: 16  18 20  Temp: 99.1 F (37.3 C) 98.8 F (37.1 C) 99.8 F (37.7 C) 99.5 F (37.5 C)  TempSrc: Oral Oral Oral Oral  SpO2: 100% 100% 100% 99%  Weight:      Height:        Intake/Output Summary (Last 24 hours) at 10/17/2019 1508 Last data filed at 10/16/2019 2252 Gross per 24 hour  Intake 100.09 ml  Output -  Net 100.09 ml   Filed Weights     10/15/19 1749  Weight: 68 kg    Examination:   Constitutional: NAD, AAOx3 HEENT: conjunctivae and lids normal, EOMI CV: RRR 3+ systolic murmur at upper sternal boarder.  Distal pulses +2.  No cyanosis.   RESP: CTA B/L, normal respiratory effort  GI: +BS, NTND Extremities: No effusions, edema, or tenderness in BLE SKIN: warm, dry and intact Neuro: II - XII grossly intact.  Sensation intact Psych: Normal mood and affect.  Appropriate judgement and reason   Data  Reviewed: I have personally reviewed following labs and imaging studies  CBC: Recent Labs  Lab 10/11/19 0827 10/15/19 1557 10/15/19 1759 10/16/19 0500 10/17/19 0442  WBC 3.4* 1.8* 1.7* 1.3*  1.3* 1.9*  NEUTROABS 2.4 0.8* 0.6* 0.4* 0.7*  HGB 9.9* 10.0* 10.2* 9.2*  9.3* 9.4*  HCT 28.9* 28.8* 29.1* 26.8*  27.0* 28.0*  MCV 89.8 89.2 88.7 91.2  89.4 89.7  PLT 80* 86* 91* 70*  74* 78*   Basic Metabolic Panel: Recent Labs  Lab 10/11/19 0827 10/14/19 0903 10/15/19 1557 10/15/19 1759 10/16/19 0500 10/17/19 0442  NA 135  --  132* 134* 137 137  K 4.3  --  4.2 4.2 4.3 4.4  CL 103  --  102 103 110 108  CO2 22  --  22 23 21* 22  GLUCOSE 165*  --  107* 111* 128* 103*  BUN 35*  --  52* 48* 34* 18  CREATININE 1.38*  --  1.60* 1.63* 1.08* 1.05*  CALCIUM 8.6*  --  8.1* 8.3* 7.5* 8.4*  MG 1.1* 1.4* 2.2  --   --  1.6*   GFR: Estimated Creatinine Clearance: 49.6 mL/min (A) (by C-G formula based on SCr of 1.05 mg/dL (H)). Liver Function Tests: Recent Labs  Lab 10/11/19 0827 10/15/19 1557 10/15/19 1759  AST _0 ALT _1 ALKPHOS 82 76 70  BILITOT 0.4 0.5 0.8  PROT 6.1* 6.5 6.4*  ALBUMIN 3.7 3.8 3.9   No results for input(s): LIPASE, AMYLASE in the last 168 hours. No results for input(s): AMMONIA in the last 168 hours. Coagulation Profile: No results for input(s): INR, PROTIME in the last 168 hours. Cardiac Enzymes: No results for input(s): CKTOTAL, CKMB, CKMBINDEX, TROPONINI in the last 168 hours. BNP (last 3 results) No results for input(s): PROBNP in the last 8760 hours. HbA1C: Recent Labs    10/16/19 0500  HGBA1C 6.4*   CBG: Recent Labs  Lab 10/16/19 1225 10/16/19 1635 10/16/19 1651  GLUCAP 104* 136* 161*   Lipid Profile: No results for input(s): CHOL, HDL, LDLCALC, TRIG, CHOLHDL, LDLDIRECT in the last 72 hours. Thyroid Function Tests: No results for input(s): TSH, T4TOTAL, FREET4, T3FREE, THYROIDAB in the last 72 hours. Anemia Panel: No results  for input(s): VITAMINB12, FOLATE, FERRITIN, TIBC, IRON, RETICCTPCT in the last 72 hours. Sepsis Labs: Recent Labs  Lab 10/15/19 1558 10/15/19 1759  PROCALCITON  --  0.24  LATICACIDVEN 1.0 0.9    Recent Results (from the past 240 hour(s))  Culture, blood (single) w Reflex to ID Panel     Status: None (Preliminary result)   Collection Time: 10/15/19  3:58 PM   Specimen: BLOOD LEFT HAND  Result Value Ref Range Status   Specimen Description   Final    BLOOD LEFT HAND Performed at [facility], [place] [address], [place], [place] [address]    Special Requests   Final    BOTTLES DRAWN AEROBIC AND ANAEROBIC Blood Culture adequate volume Performed  at [facility], [place] [address] [place], [place] [address]    Culture   Final    NO GROWTH 2 DAYS Performed at [facility], [address] [place], [place] [address]    Report Status PENDING  Incomplete  Culture, blood (single) w Reflex to ID Panel     Status: None (Preliminary result)   Collection Time: 10/15/19  3:58 PM   Specimen: BLOOD  Result Value Ref Range Status   Specimen Description   Final    BLOOD PORTA CATH Performed at [facility], [place] [address], [place], [place] [address]    Special Requests   Final    BOTTLES DRAWN AEROBIC AND ANAEROBIC Blood Culture adequate volume Performed at [facility], [place] [address] [place], [place] [address]    Culture   Final    NO GROWTH 2 DAYS Performed at [facility], [address] [place], [place] [address]    Report Status PENDING  Incomplete  Urine Culture     Status: Abnormal   Collection Time: 10/15/19  4:04 PM   Specimen: Urine, Clean Catch  Result Value Ref Range Status   Specimen Description   Final    URINE, CLEAN CATCH Performed at [facility], [address], [place], [place] [address]    Special Requests   Final    NONE Performed at [facility], [address] [place], [place] [address]     Culture (A)  Final    <10,000 COLONIES/mL INSIGNIFICANT GROWTH Performed at [facility] [address] [place], [place] [address]    Report Status 10/17/2019 FINAL  Final  Respiratory Panel by RT PCR (Flu A&B, Covid) - Nasopharyngeal Swab     Status: None   Collection Time: 10/15/19 10:37 PM   Specimen: Nasopharyngeal Swab  Result Value Ref Range Status   SARS Coronavirus 2 by RT PCR NEGATIVE NEGATIVE Final    Comment: (NOTE) SARS-CoV-2 target nucleic acids are NOT DETECTED. The SARS-CoV-2 RNA is generally detectable in upper respiratoy specimens during the acute phase of infection. The lowest concentration of SARS-CoV-2 viral copies this assay can detect is 131 copies/mL. A negative result does not preclude SARS-Cov-2 infection and should not be used as the sole basis for treatment or other patient management decisions. A negative result may occur with  improper specimen collection/handling, submission of specimen other than nasopharyngeal swab, presence of viral mutation(s) within the areas targeted by this assay, and inadequate number of viral copies (<131 copies/mL). A negative result must be combined with clinical observations, patient history, and epidemiological information. The expected result is Negative. Fact Sheet for Patients:  PinkCheek.be Fact Sheet for Healthcare Providers:  GravelBags.it This test is not yet ap proved or cleared by the [place] FDA and  has been authorized for detection and/or diagnosis of SARS-CoV-2 by FDA under an Emergency Use Authorization (EUA). This EUA will remain  in effect (meaning this test can be used) for the duration of the COVID-19 declaration under Section 564(b)(1) of the Act, 21 U.S.C. section 360bbb-3(b)(1), unless the authorization is terminated or revoked sooner.    Influenza A by PCR NEGATIVE NEGATIVE Final   Influenza B by PCR NEGATIVE NEGATIVE Final     Comment: (NOTE) The Xpert Xpress SARS-CoV-2/FLU/RSV assay is intended as an aid in  the diagnosis of influenza from Nasopharyngeal swab specimens and  should not be used as a sole basis for treatment.  Nasal washings and  aspirates are unacceptable for Xpert Xpress SARS-CoV-2/FLU/RSV  testing. Fact Sheet for Patients: PinkCheek.be Fact Sheet for Healthcare Providers: GravelBags.it This test is not yet approved or cleared by the [place] FDA and  has been authorized for detection and/or diagnosis of SARS-CoV-2 by  FDA under an Emergency Use Authorization (EUA). This EUA will remain  in effect (meaning this test can be used) for the duration of the  Covid-19 declaration under Section 564(b)(1) of the Act, 21  U.S.C. section 360bbb-3(b)(1), unless the authorization is  terminated or revoked. Performed at [facility], [address] [place], [place] [address]   Urine culture     Status: Abnormal   Collection Time: 10/16/19 12:52 AM   Specimen: Urine, Random  Result Value Ref Range Status   Specimen Description   Final    URINE, RANDOM Performed at [facility], [address], [place], [place] [address]    Special Requests   Final    NONE Performed at [facility], [address] [place], [place] [address]    Culture (A)  Final    <10,000 COLONIES/mL INSIGNIFICANT GROWTH Performed at [facility], [place] [address] [place], [place] [address]    Report Status 10/17/2019 FINAL  Final      Radiology Studies: DG Chest 2 View  Result Date: 10/15/2019 CLINICAL DATA:  Fever. EXAM: CHEST - 2 VIEW COMPARISON:  Radiograph 01/04/2019 FINDINGS: Right chest port with tip in the SVC.The cardiomediastinal contours are normal. The lungs are clear. Pulmonary vasculature is normal. No consolidation, pleural effusion, or pneumothorax. No acute osseous abnormalities are seen. IMPRESSION: No acute  chest findings. Electronically Signed   By: Keith Rake M.D.   On: 10/15/2019 18:41   DG Chest 2 View  Result Date: 10/15/2019 CLINICAL DATA:  Fever. Multiple myeloma on oral chemotherapy. Fever, chills and fatigue. EXAM: CHEST - 2 VIEW COMPARISON:  Radiograph 2 hours prior. FINDINGS: Right chest port with tip in the lower SVC.The cardiomediastinal contours are normal. The lungs are clear. Pulmonary vasculature is normal. No consolidation, pleural effusion, or pneumothorax. No acute osseous abnormalities are seen. Degenerative change in the thoracic spine. IMPRESSION: No acute chest findings or evidence of pneumonia. Electronically Signed   By: Keith Rake M.D.   On: 10/15/2019 18:41     Scheduled Meds: . acyclovir  400 mg Oral BID  . aspirin  81 mg Oral Daily  . Chlorhexidine Gluconate Cloth  6 each Topical Daily  . enoxaparin (LOVENOX) injection  40 mg Subcutaneous Q24H  . fentaNYL  1 patch Transdermal Q72H  . FLUoxetine  40 mg Oral Daily  . magic mouthwash  10 mL Oral TID   And  . lidocaine  10 mL Mouth/Throat TID  . pantoprazole  40 mg Oral Daily  . pantoprazole  40 mg Oral Daily  . pravastatin  20 mg Oral Daily  . Tbo-filgastrim (GRANIX) SQ  480 mcg Subcutaneous Daily  . traZODone  100 mg Oral QHS   Continuous Infusions: . sodium chloride Stopped (10/16/19 2252)  . ceFEPime (MAXIPIME) IV 2 g (10/17/19 0959)     LOS: 2 days     Enzo Bi, MD [facility] If 7PM-7AM, please contact night-coverage 10/17/2019, 3:08 PM

## 2019-10-18 ENCOUNTER — Inpatient Hospital Stay: Payer: Medicare PPO

## 2019-10-18 ENCOUNTER — Inpatient Hospital Stay: Payer: Medicare PPO | Admitting: Hematology and Oncology

## 2019-10-18 ENCOUNTER — Other Ambulatory Visit: Payer: Self-pay | Admitting: Hematology and Oncology

## 2019-10-18 DIAGNOSIS — Z95828 Presence of other vascular implants and grafts: Secondary | ICD-10-CM

## 2019-10-18 DIAGNOSIS — Z885 Allergy status to narcotic agent status: Secondary | ICD-10-CM

## 2019-10-18 DIAGNOSIS — K760 Fatty (change of) liver, not elsewhere classified: Secondary | ICD-10-CM

## 2019-10-18 DIAGNOSIS — D649 Anemia, unspecified: Secondary | ICD-10-CM | POA: Diagnosis not present

## 2019-10-18 DIAGNOSIS — Z960 Presence of urogenital implants: Secondary | ICD-10-CM

## 2019-10-18 DIAGNOSIS — F329 Major depressive disorder, single episode, unspecified: Secondary | ICD-10-CM

## 2019-10-18 DIAGNOSIS — C9002 Multiple myeloma in relapse: Secondary | ICD-10-CM

## 2019-10-18 DIAGNOSIS — R011 Cardiac murmur, unspecified: Secondary | ICD-10-CM

## 2019-10-18 DIAGNOSIS — Z79899 Other long term (current) drug therapy: Secondary | ICD-10-CM

## 2019-10-18 DIAGNOSIS — Z87891 Personal history of nicotine dependence: Secondary | ICD-10-CM

## 2019-10-18 DIAGNOSIS — D696 Thrombocytopenia, unspecified: Secondary | ICD-10-CM

## 2019-10-18 DIAGNOSIS — K5900 Constipation, unspecified: Secondary | ICD-10-CM

## 2019-10-18 DIAGNOSIS — Q231 Congenital insufficiency of aortic valve: Secondary | ICD-10-CM

## 2019-10-18 DIAGNOSIS — Z9481 Bone marrow transplant status: Secondary | ICD-10-CM

## 2019-10-18 DIAGNOSIS — D709 Neutropenia, unspecified: Secondary | ICD-10-CM | POA: Diagnosis not present

## 2019-10-18 DIAGNOSIS — Z888 Allergy status to other drugs, medicaments and biological substances status: Secondary | ICD-10-CM

## 2019-10-18 DIAGNOSIS — R5081 Fever presenting with conditions classified elsewhere: Secondary | ICD-10-CM | POA: Diagnosis not present

## 2019-10-18 DIAGNOSIS — E119 Type 2 diabetes mellitus without complications: Secondary | ICD-10-CM

## 2019-10-18 LAB — CBC WITH DIFFERENTIAL/PLATELET
Abs Immature Granulocytes: 0 10*3/uL (ref 0.00–0.07)
Basophils Absolute: 0 10*3/uL (ref 0.0–0.1)
Basophils Relative: 2 %
Eosinophils Absolute: 0.1 10*3/uL (ref 0.0–0.5)
Eosinophils Relative: 4 %
HCT: 26.4 % — ABNORMAL LOW (ref 36.0–46.0)
Hemoglobin: 9.4 g/dL — ABNORMAL LOW (ref 12.0–15.0)
Immature Granulocytes: 0 %
Lymphocytes Relative: 28 %
Lymphs Abs: 0.7 10*3/uL (ref 0.7–4.0)
MCH: 31.1 pg (ref 26.0–34.0)
MCHC: 35.6 g/dL (ref 30.0–36.0)
MCV: 87.4 fL (ref 80.0–100.0)
Monocytes Absolute: 0.7 10*3/uL (ref 0.1–1.0)
Monocytes Relative: 26 %
Neutro Abs: 1 10*3/uL — ABNORMAL LOW (ref 1.7–7.7)
Neutrophils Relative %: 40 %
Platelets: 84 10*3/uL — ABNORMAL LOW (ref 150–400)
RBC: 3.02 MIL/uL — ABNORMAL LOW (ref 3.87–5.11)
RDW: 13.5 % (ref 11.5–15.5)
Smear Review: NORMAL
WBC: 2.5 10*3/uL — ABNORMAL LOW (ref 4.0–10.5)
nRBC: 0 % (ref 0.0–0.2)

## 2019-10-18 LAB — BASIC METABOLIC PANEL
Anion gap: 8 (ref 5–15)
BUN: 16 mg/dL (ref 8–23)
CO2: 22 mmol/L (ref 22–32)
Calcium: 8.6 mg/dL — ABNORMAL LOW (ref 8.9–10.3)
Chloride: 105 mmol/L (ref 98–111)
Creatinine, Ser: 0.99 mg/dL (ref 0.44–1.00)
GFR calc Af Amer: >60 mL/min (ref >60)
GFR calc non Af Amer: >60 mL/min (ref >60)
Glucose, Bld: 135 mg/dL — ABNORMAL HIGH (ref 70–99)
Potassium: 4.8 mmol/L (ref 3.5–5.1)
Sodium: 135 mmol/L (ref 135–145)

## 2019-10-18 LAB — MAGNESIUM: Magnesium: 1.3 mg/dL — ABNORMAL LOW (ref 1.7–2.4)

## 2019-10-18 LAB — PATHOLOGIST SMEAR REVIEW

## 2019-10-18 MED ORDER — MAGNESIUM SULFATE 2 GM/50ML IV SOLN
2.0000 g | INTRAVENOUS | Status: AC
  Administered 2019-10-18 (×3): 2 g via INTRAVENOUS
  Filled 2019-10-18 (×3): qty 50

## 2019-10-18 MED ORDER — POLYETHYLENE GLYCOL 3350 17 G PO PACK
17.0000 g | PACK | Freq: Two times a day (BID) | ORAL | Status: DC
  Administered 2019-10-18 – 2019-10-20 (×3): 17 g via ORAL
  Filled 2019-10-18 (×5): qty 1

## 2019-10-18 NOTE — Progress Notes (Signed)
[facility] Hematology/Oncology Progress Note  Date of admission: 10/15/2019  Hospital day:  10/18/2019  Chief Complaint: Sarah [name] is a 64 y.o. female with multiple myeloma who was admitted with fever and neutropenia.  Subjective: Feels fatigue.  Notes some palpitations with blood pressure medications held.  Sore throat better.  Slight cough.  Fever persists.  Social History: The patient is accompanied by her nurse today.  Allergies:  Allergies  Allergen Reactions  . Oxycodone-Acetaminophen Anaphylaxis    Swelling and rash  . Celebrex [Celecoxib] Diarrhea  . Codeine   . Benadryl [Diphenhydramine] Palpitations  . Morphine Itching and Rash  . Ondansetron Diarrhea  . Tylenol [Acetaminophen] Itching and Rash    Scheduled Medications: . acyclovir  400 mg Oral BID  . aspirin  81 mg Oral Daily  . Chlorhexidine Gluconate Cloth  6 each Topical Daily  . enoxaparin (LOVENOX) injection  40 mg Subcutaneous Q24H  . fentaNYL  1 patch Transdermal Q72H  . FLUoxetine  40 mg Oral Daily  . magic mouthwash  10 mL Oral TID   And  . lidocaine  10 mL Mouth/Throat TID  . pantoprazole  40 mg Oral Daily  . pravastatin  20 mg Oral Daily  . Tbo-filgastrim (GRANIX) SQ  480 mcg Subcutaneous Daily  . traZODone  100 mg Oral QHS    Review of Systems: GENERAL:  Feels fatigued.  Fevers.  No chills or sweats. PERFORMANCE STATUS (ECOG):  1-2 HEENT:  Sore throat resolved.  No visual changes, runny nose, mouth sores or tenderness. Lungs: Slight cough.  No shortness of breath.  No hemoptysis. Cardiac:  Palpitations, intermittent.  No chest pain, orthopnea, or PND. GI:  Diarrhea, resolved.  Chronic nausea.  No vomiting, constipation, melena or hematochezia. GU:  No urgency, frequency, dysuria, or hematuria. Musculoskeletal:  Chronic back pain.  No joint pain.  No muscle tenderness. Extremities:  No pain or swelling. Skin:  Rash in hand resolved.  No skin changes. Neuro:  No  headache, numbness or weakness, balance or coordination issues. Endocrine:  No diabetes, thyroid issues, hot flashes or night sweats. Psych:  No mood changes, depression or anxiety. Pain:  No focal pain. Review of systems:  All other systems reviewed and found to be negative.  Physical Exam: Blood pressure (!) 107/57, pulse 75, temperature 99.8 F (37.7 C), temperature source Oral, resp. rate 18, height 5' 2"  (1.575 m), weight 149 lb 14.6 oz (68 kg), SpO2 100 %.  GENERAL:  Fatigued appearing woman lying comfortably on the medical unit in no acute distress. MENTAL STATUS:  Alert and oriented to person, place and time. HEAD:  Shoulder length graying hair.  Normocephalic, atraumatic, face symmetric, no Cushingoid features. EYES:  Glasses.  Pupils equal round and reactive to light and accomodation.  No conjunctivitis or scleral icterus. ENT:  Oropharynx clear without lesion.  Tongue normal. Mucous membranes moist.  RESPIRATORY:  Clear to auscultation anteriorly.  without rales, wheezes or rhonchi. CARDIOVASCULAR:  Regular rate and rhythm without murmur, rub or gallop. ABDOMEN:  Tender lower quadrants without guarding or rebound.  Soft with active bowel sounds, and no hepatosplenomegaly.  No masses. SKIN:  Dry skin (no blisters) between digits of right hand. EXTREMITIES: No edema, no skin discoloration or tenderness.  No palpable cords. LYMPH NODES: No palpable cervical, supraclavicular, axillary or inguinal adenopathy  NEUROLOGICAL: Unremarkable. PSYCH:  Appropriate.   Results for orders placed or performed during the hospital encounter of 10/15/19 (from the past 48 hour(s))  Glucose,  capillary     Status: Abnormal   Collection Time: 10/16/19 12:25 PM  Result Value Ref Range   Glucose-Capillary 104 (H) 70 - 99 mg/dL    Comment: Glucose reference range applies only to samples taken after fasting for at least 8 hours.  Glucose, capillary     Status: Abnormal   Collection Time: 10/16/19  4:35  PM  Result Value Ref Range   Glucose-Capillary 136 (H) 70 - 99 mg/dL    Comment: Glucose reference range applies only to samples taken after fasting for at least 8 hours.  Glucose, capillary     Status: Abnormal   Collection Time: 10/16/19  4:51 PM  Result Value Ref Range   Glucose-Capillary 161 (H) 70 - 99 mg/dL    Comment: Glucose reference range applies only to samples taken after fasting for at least 8 hours.  CBC with Differential/Platelet     Status: Abnormal   Collection Time: 10/17/19  4:42 AM  Result Value Ref Range   WBC 1.9 (L) 4.0 - 10.5 K/uL   RBC 3.12 (L) 3.87 - 5.11 MIL/uL   Hemoglobin 9.4 (L) 12.0 - 15.0 g/dL   HCT 28.0 (L) 36.0 - 46.0 %   MCV 89.7 80.0 - 100.0 fL   MCH 30.1 26.0 - 34.0 pg   MCHC 33.6 30.0 - 36.0 g/dL   RDW 13.8 11.5 - 15.5 %   Platelets 78 (L) 150 - 400 K/uL    Comment: Immature Platelet Fraction may be clinically indicated, consider ordering this additional test [id number]    nRBC 0.0 0.0 - 0.2 %   Neutrophils Relative % 37 %   Neutro Abs 0.7 (L) 1.7 - 7.7 K/uL   Lymphocytes Relative 29 %   Lymphs Abs 0.6 (L) 0.7 - 4.0 K/uL   Monocytes Relative 23 %   Monocytes Absolute 0.4 0.1 - 1.0 K/uL   Eosinophils Relative 9 %   Eosinophils Absolute 0.2 0.0 - 0.5 K/uL   Basophils Relative 1 %   Basophils Absolute 0.0 0.0 - 0.1 K/uL   WBC Morphology MORPHOLOGY UNREMARKABLE    Smear Review PLATELETS APPEAR DECREASED    Immature Granulocytes 1 %   Abs Immature Granulocytes 0.01 0.00 - 0.07 K/uL   Ovalocytes PRESENT     Comment: Performed at [facility], [address] [place], [place] [address]  Basic metabolic panel     Status: Abnormal   Collection Time: 10/17/19  4:42 AM  Result Value Ref Range   Sodium 137 135 - 145 mmol/L   Potassium 4.4 3.5 - 5.1 mmol/L   Chloride 108 98 - 111 mmol/L   CO2 22 22 - 32 mmol/L   Glucose, Bld 103 (H) 70 - 99 mg/dL    Comment: Glucose reference range applies only to samples taken after fasting for at  least 8 hours.   BUN 18 8 - 23 mg/dL   Creatinine, Ser 1.05 (H) 0.44 - 1.00 mg/dL   Calcium 8.4 (L) 8.9 - 10.3 mg/dL   GFR calc non Af Amer 56 (L) >60 mL/min   GFR calc Af Amer >60 >60 mL/min   Anion gap 7 5 - 15    Comment: Performed at [facility], [address], [place],  [address]  Magnesium     Status: Abnormal   Collection Time: 10/17/19  4:42 AM  Result Value Ref Range   Magnesium 1.6 (L) 1.7 - 2.4 mg/dL    Comment: Performed at [facility], [address] [place], [place] [address]   No results found.   Assessment:  Sarah [name] is a 64 y.o. 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  She has chronic hypomagnesemia and requires IV supplementation 1-2x/week.  She received her second COVID-19vaccineon 10/09/2019.  COVID-19 testing on 10/15/2019 was negative.  Symptomatically, she has persistent fevers.  Diarrhea has resolved.  Cough is slight.  Exam reveals a tender abdomen without guarding or rebound tenderness.  ANC has improved with Granix.    Plan: 1. Stage III multiple myeloma She is day 22 of cycle #1 daratumumab + Pomalyst and dexamethasone (DPd).     Chemotherapy on hold. 2. Anemia and thrombocytopenia  Hemoglobin 9.4.  Platelets 78,000.  Etiology secondary to myeloma and chemotherapy.  Counts appear to be improving with a recovering marrow s/p cycle #1 DPd.  Monitor daily. 3.   Fever and neutropenia ANC 700 on Granix.  CXR, UA and culture are negative.  Respiratory panel negative.  Blood cultures are negative to date.  Exam reveals a tender abdomen.             Patient remains on broad spectrum antibiotics (vancomycin, Cefepime, Flagyl).  Consider ID consultation. 4.  Hypomagnesemia, chronic Magnesium 1.3.             Anticipate IV magnesium today.  Patient typically receives 4 gm IV 1-2 x/week. 5.   Diarrhea             Resolved. 6.   Dehydration             Creatinine 1.63 to 0.99.  Resolved with IVF. 7.   Palpitations  Patient notes a history of SVT.  Consider central telemetry (nurse aware). 8.   Disposition  Patient to remain hospitalized.   Lequita Asal, MD  10/18/2019, 8:00 AM

## 2019-10-18 NOTE — Care Management Important Message (Signed)
Important Message  Patient Details  Name: Haisley Fiscal MRN: [id number] Date of Birth: 07-Mar-1956   Medicare Important Message Given:  Yes     Juliann Pulse A Broox Lonigro 10/18/2019, 11:11 AM

## 2019-10-18 NOTE — Consult Note (Signed)
NAME: Sarah [name]  DOB: 05-01-1956  MRN: [id number]  Date/Time: 10/18/2019 6:04 PM  REQUESTING PROVIDER: Dr. Mike Gip Subjective:  REASON FOR CONSULT: Febrile neutropenia ? Sarah [name] is a 64 y.o. female with a history of multiple myeloma on chemotherapy, bicuspid aortic valve, diabetes mellitus, fatty liver, bone marrow transplant, Patient is on chemotherapy with daratumumab - hyaluronidase-fihj pomalidomide and decadron Admitted with fatigue, fever and diarrhea  UA was negative, chest x-ray was negative,. 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 She initially was on Revlimid which was discontinued in July 2018 because of intolerance.  She had a bone marrow aspirate and biopsy on 07/19/2019 which revealed a normocellular marrow but increased lambda restricted plasma cells consistent with recurrent plasma cell myeloma.  She is followed by Dr. Mike Gip. She had a PET scan on 07/12/2019 with no focal metabolic activity in the skeleton. She started chemotherapy on 09/27/2019 with daratumumab - hyaluronidase-fihj, Pomalidomide and Decadron.  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  Which was discontinued in August 2018. She completed both the COVID-19 vaccines the last one being on 10/09/2019   Past Medical History:  Diagnosis Date  . Abnormal stress test 02/14/2016   Overview:  Added automatically from request for surgery [id number]  . Anemia   . Anxiety   . Arthritis   . Bicuspid aortic valve   . CHF (congestive heart failure) ([place])   . CKD (chronic kidney disease) stage 3, GFR 30-59 ml/min   . Depression   . Diabetes mellitus ([place])   . Dizziness   . Fatty liver   . Frequent falls   . GERD (gastroesophageal reflux disease)   . Gout   . Heart murmur   . History of blood transfusion   . History of bone marrow  transplant ([place])   . History of uterine fibroid   . Hx of cardiac catheterization 06/05/2016   Overview:  Normal coronaries 2017  . Hypertension   . Hypomagnesemia   . Multiple myeloma ([place])   . Personal history of chemotherapy   . Renal cyst     Past Surgical History:  Procedure Laterality Date  . ABDOMINAL HYSTERECTOMY    . 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 CARDIAC ELECTROPHYSIOLOGY MAPPING AND ABLATION    . CARPAL TUNNEL RELEASE Bilateral   . CHOLECYSTECTOMY  2008  . COLONOSCOPY WITH PROPOFOL N/A 05/07/2017   Procedure: COLONOSCOPY WITH PROPOFOL;  Surgeon: [name], MD;  Location: [facility] ENDOSCOPY;  Service: Gastroenterology;  Laterality: N/A;  . ESOPHAGOGASTRODUODENOSCOPY (EGD) WITH PROPOFOL N/A 05/07/2017   Procedure: ESOPHAGOGASTRODUODENOSCOPY (EGD) WITH PROPOFOL;  Surgeon: [name], MD;  Location: [facility] ENDOSCOPY;  Service: Gastroenterology;  Laterality: N/A;  . FOOT SURGERY Bilateral   . INCONTINENCE SURGERY  2009  . INTERSTIM IMPLANT PLACEMENT    . other     over active bladder  . OTHER SURGICAL HISTORY     bladder stimulator   . PARTIAL HYSTERECTOMY  03/1996   fibroids  . PORTA CATH INSERTION N/A 03/10/2019   Procedure: PORTA CATH INSERTION;  Surgeon: Algernon Huxley, MD;  Location: [facility] CV LAB;  Service: Cardiovascular;  Laterality: N/A;  . TONSILLECTOMY  2007    Social History   Socioeconomic History  . Marital status: Widowed    Spouse name: Not on file  . Number of children: Not  on file  . Years of education: Not on file  . Highest education level: Not on file  Occupational History  . Occupation: Disabled  Tobacco Use  . Smoking status: Former Smoker    Packs/day: 1.00    Years: 20.00    Pack years: 20.00    Types: Cigarettes    Quit date: 07/02/1991    Years since quitting: 28.3  . Smokeless tobacco: Never Used  Substance and Sexual Activity  . Alcohol use: Not Currently  . Drug use: No  . Sexual activity: Never  Other Topics Concern  .  Not on file  Social History Narrative     Both her sons committed suicide    Family History  Problem Relation Age of Onset  . Colon cancer Father   . Renal Disease Father   . Diabetes Mellitus II Father   . Melanoma Paternal Grandmother   . Breast cancer Maternal Aunt 42  . Anemia Mother   . Heart disease Mother   . Heart failure Mother   . Renal Disease Mother   . Congestive Heart Failure Mother   . Heart disease Maternal Uncle   . Throat cancer Maternal Uncle   . Lung cancer Maternal Uncle   . Liver disease Maternal Uncle   . Heart failure Maternal Uncle   . Hearing loss Son 18       Suicide    Allergies  Allergen Reactions  . Oxycodone-Acetaminophen Anaphylaxis    Swelling and rash  . Celebrex [Celecoxib] Diarrhea  . Codeine   . Benadryl [Diphenhydramine] Palpitations  . Morphine Itching and Rash  . Ondansetron Diarrhea  . Tylenol [Acetaminophen] Itching and Rash    ? Current Facility-Administered Medications  Medication Dose Route Frequency Provider Last Rate Last Admin  . 0.9 %  sodium chloride infusion   Intravenous PRN Enzo Bi, MD   Stopped at 10/16/19 2252  . acyclovir (ZOVIRAX) 200 MG capsule 400 mg  400 mg Oral BID Enzo Bi, MD   400 mg at 10/18/19 0918  . aspirin chewable tablet 81 mg  81 mg Oral Daily Enzo Bi, MD   81 mg at 10/18/19 0917  . ceFEPIme (MAXIPIME) 2 g in sodium chloride 0.9 % 100 mL IVPB  2 g Intravenous Q12H Hall, Scott A, RPH 200 mL/hr at 10/18/19 0936 2 g at 10/18/19 0936  . Chlorhexidine Gluconate Cloth 2 % PADS 6 each  6 each Topical Daily Enzo Bi, MD   6 each at 10/18/19 [phone]  . enoxaparin (LOVENOX) injection 40 mg  40 mg Subcutaneous Q24H Cammie Sickle, MD   40 mg at 10/18/19 0917  . fentaNYL (DURAGESIC) 25 MCG/HR 1 patch  1 patch Transdermal Q72H Enzo Bi, MD   1 patch at 10/17/19 1524  . FLUoxetine (PROZAC) capsule 40 mg  40 mg Oral Daily Enzo Bi, MD   40 mg at 10/18/19 0917  . magic mouthwash  10 mL Oral TID Enzo Bi, MD   10 mL at 10/17/19 1527   And  . lidocaine (XYLOCAINE) 2 % viscous mouth solution 10 mL  10 mL Mouth/Throat TID Enzo Bi, MD   10 mL at 10/17/19 1527  . metroNIDAZOLE (FLAGYL) IVPB 500 mg  500 mg Intravenous Azzie Roup, MD 100 mL/hr at 10/18/19 1753 500 mg at 10/18/19 1753  . pantoprazole (PROTONIX) EC tablet 40 mg  40 mg Oral Daily Enzo Bi, MD   40 mg at 10/18/19 0917  . polyethylene glycol (MIRALAX /  GLYCOLAX) packet 17 g  17 g Oral BID Enzo Bi, MD   17 g at 10/18/19 1642  . pravastatin (PRAVACHOL) tablet 20 mg  20 mg Oral Daily Enzo Bi, MD   20 mg at 10/18/19 0917  . promethazine (PHENERGAN) tablet 25 mg  25 mg Oral Q4H PRN Enzo Bi, MD   25 mg at 10/18/19 1236  . Tbo-Filgrastim (GRANIX) injection 480 mcg  480 mcg Subcutaneous Daily Cammie Sickle, MD   480 mcg at 10/18/19 0918  . tiZANidine (ZANAFLEX) tablet 4 mg  4 mg Oral Q8H PRN Enzo Bi, MD   4 mg at 10/18/19 0331  . traMADol (ULTRAM) tablet 50 mg  50 mg Oral Q6H PRN Enzo Bi, MD   50 mg at 10/18/19 1757  . traZODone (DESYREL) tablet 100 mg  100 mg Oral QHS Enzo Bi, MD   100 mg at 10/17/19 2255  . vancomycin (VANCOCIN) IVPB 1000 mg/200 mL premix  1,000 mg Intravenous Q24H Enzo Bi, MD       Facility-Administered Medications Ordered in Other Encounters  Medication Dose Route Frequency Provider Last Rate Last Admin  . sodium chloride flush (NS) 0.9 % injection 10 mL  10 mL Intravenous PRN Lequita Asal, MD   10 mL at 10/15/19 1618     Abtx:  Anti-infectives (From admission, onward)   Start     Dose/Rate Route Frequency Ordered Stop   10/18/19 1900  vancomycin (VANCOCIN) IVPB 1000 mg/200 mL premix     1,000 mg 200 mL/hr over 60 Minutes Intravenous Every 24 hours 10/17/19 1747     10/17/19 1800  metroNIDAZOLE (FLAGYL) IVPB 500 mg     500 mg 100 mL/hr over 60 Minutes Intravenous Every 8 hours 10/17/19 1702     10/17/19 1745  vancomycin (VANCOREADY) IVPB 1500 mg/300 mL     1,500 mg 150 mL/hr over  120 Minutes Intravenous  Once 10/17/19 1741 10/17/19 2238   10/17/19 1400  acyclovir (ZOVIRAX) 200 MG capsule 400 mg     400 mg Oral 2 times daily 10/17/19 1328     10/16/19 1000  ceFEPIme (MAXIPIME) 2 g in sodium chloride 0.9 % 100 mL IVPB     2 g 200 mL/hr over 30 Minutes Intravenous Every 12 hours 10/15/19 2347     10/16/19 0800  ceFEPIme (MAXIPIME) 2 g in sodium chloride 0.9 % 100 mL IVPB  Status:  Discontinued     2 g 200 mL/hr over 30 Minutes Intravenous Every 8 hours 10/15/19 2341 10/15/19 2347   10/16/19 0200  vancomycin (VANCOREADY) IVPB 500 mg/100 mL     500 mg 100 mL/hr over 60 Minutes Intravenous  Once 10/16/19 0119 10/16/19 0403   10/16/19 0115  vancomycin variable dose per unstable renal function (pharmacist dosing)  Status:  Discontinued      Does not apply See admin instructions 10/16/19 0115 10/16/19 1314   10/15/19 2230  ceFEPIme (MAXIPIME) 2 g in sodium chloride 0.9 % 100 mL IVPB     2 g 200 mL/hr over 30 Minutes Intravenous  Once 10/15/19 2222 10/15/19 2312   10/15/19 2230  metroNIDAZOLE (FLAGYL) IVPB 500 mg     500 mg 100 mL/hr over 60 Minutes Intravenous  Once 10/15/19 2222 10/16/19 0013   10/15/19 2230  vancomycin (VANCOCIN) IVPB 1000 mg/200 mL premix     1,000 mg 200 mL/hr over 60 Minutes Intravenous  Once 10/15/19 2222 10/16/19 0128      REVIEW OF SYSTEMS:  Const:  Fever better, negative chills, negative weight loss Eyes: negative diplopia or visual changes, negative eye pain ENT: negative coryza, negative sore throat Resp: negative cough, hemoptysis, dyspnea Cards: negative for chest pain, palpitations, lower extremity edema GU had incontinence but with bladder stimulator much better GI: constipated and has to take lactulose which gives diarrhea but now no stool since Friday Left side abdominal pain Skin: rash in the interdigital space on the rt  which is improving Heme: negative for easy bruising and gum/nose bleeding MS: generalized  weakness Neurolo:negative for headaches, dizziness, vertigo, memory problems  Psych:some, depression  Endocrine: negative for thyroid, diabetes Allergy/Immunology- as above Objective:  VITALS:  BP 103/85 (BP Location: Left Arm)   Pulse 83   Temp 98.9 F (37.2 C) (Oral)   Resp 16   Ht 5' 2"  (1.575 m)   Wt 68 kg   SpO2 100%   BMI 27.42 kg/m  PHYSICAL EXAM:  General: Alert, cooperative, no distress, appears stated age.  Head: Normocephalic, without obvious abnormality, atraumatic. Eyes: Conjunctivae clear, anicteric sclerae. Pupils are equal ENT Nares normal. No drainage or sinus tenderness. Lips, mucosa, and tongue normal. No Thrush Neck: Supple, symmetrical, no adenopathy, thyroid: non tender no carotid bruit and no JVD. Port in place Back: No CVA tenderness. Lungs: Clear to auscultation bilaterally. No Wheezing or Rhonchi. No rales. Heart: irregular, systolic murmur Abdomen: Soft, some tenderness to palpation left side, BS okay Extremities:     Scaly rash interdigital cleft Rt > left Skin: No rashes or lesions. Or bruising Lymph: Cervical, supraclavicular normal. Neurologic: Grossly non-focal Pertinent Labs Lab Results CBC    Component Value Date/Time   WBC 2.5 (L) 10/18/2019 0728   RBC 3.02 (L) 10/18/2019 0728   HGB 9.4 (L) 10/18/2019 0728   HCT 26.4 (L) 10/18/2019 0728   PLT 84 (L) 10/18/2019 0728   MCV 87.4 10/18/2019 0728   MCH 31.1 10/18/2019 0728   MCHC 35.6 10/18/2019 0728   RDW 13.5 10/18/2019 0728   LYMPHSABS 0.7 10/18/2019 0728   MONOABS 0.7 10/18/2019 0728   EOSABS 0.1 10/18/2019 0728   BASOSABS 0.0 10/18/2019 0728    CMP Latest Ref Rng & Units 10/18/2019 10/17/2019 10/16/2019  Glucose 70 - 99 mg/dL 135(H) 103(H) 128(H)  BUN 8 - 23 mg/dL 16 18 34(H)  Creatinine 0.44 - 1.00 mg/dL 0.99 1.05(H) 1.08(H)  Sodium 135 - 145 mmol/L 135 137 137  Potassium 3.5 - 5.1 mmol/L 4.8 4.4 4.3  Chloride 98 - 111 mmol/L 105 108 110  CO2 22 - 32 mmol/L 22 22 21(L)   Calcium 8.9 - 10.3 mg/dL 8.6(L) 8.4(L) 7.5(L)  Total Protein 6.5 - 8.1 g/dL - - -  Total Bilirubin 0.3 - 1.2 mg/dL - - -  Alkaline Phos 38 - 126 U/L - - -  AST 15 - 41 U/L - - -  ALT 0 - 44 U/L - - -      Microbiology: Recent Results (from the past 240 hour(s))  Culture, blood (single) w Reflex to ID Panel     Status: None (Preliminary result)   Collection Time: 10/15/19  3:58 PM   Specimen: BLOOD LEFT HAND  Result Value Ref Range Status   Specimen Description   Final    BLOOD LEFT HAND Performed at [facility], [place] [address], [place], [place] [address]    Special Requests   Final    BOTTLES DRAWN AEROBIC AND ANAEROBIC Blood Culture adequate volume Performed at [facility], [address] [place], [place] [address]    Culture   Final    NO GROWTH 3 DAYS Performed at [facility], [address] [place], [place] [address]    Report Status PENDING  Incomplete  Culture, blood (single) w Reflex to ID Panel     Status: None (Preliminary result)   Collection Time: 10/15/19  3:58 PM   Specimen: BLOOD  Result Value Ref Range Status   Specimen Description   Final    BLOOD PORTA CATH Performed at [facility], [place] [address] [place], [place] [address]    Special Requests   Final    BOTTLES DRAWN AEROBIC AND ANAEROBIC Blood Culture adequate volume Performed at [facility], [place] [address] [place], [place] [address]    Culture   Final    NO GROWTH 3 DAYS Performed at [facility], [address] [place], [place] [address]    Report Status PENDING  Incomplete  Urine Culture     Status: Abnormal   Collection Time: 10/15/19  4:04 PM   Specimen: Urine, Clean Catch  Result Value Ref Range Status   Specimen Description   Final    URINE, CLEAN CATCH Performed at [facility], [address], [place], [place] [address]    Special Requests   Final    NONE Performed at [facility], [address] [place], [place] [address]    Culture (A)  Final    <10,000 COLONIES/mL INSIGNIFICANT GROWTH Performed at [facility] [address] [place], [place] [address]    Report Status 10/17/2019 FINAL  Final  Respiratory Panel by RT PCR (Flu A&B, Covid) - Nasopharyngeal Swab     Status: None   Collection Time: 10/15/19 10:37 PM   Specimen: Nasopharyngeal Swab  Result Value Ref Range Status   SARS Coronavirus 2 by RT PCR NEGATIVE NEGATIVE Final    Comment: (NOTE) SARS-CoV-2 target nucleic acids are NOT DETECTED. The SARS-CoV-2 RNA is generally detectable in upper respiratoy specimens during the acute phase of infection. The lowest concentration of SARS-CoV-2 viral copies this assay can detect is 131 copies/mL. A negative result does not preclude SARS-Cov-2 infection and should not be used as the sole basis for treatment or other patient management decisions. A negative result may occur with  improper specimen collection/handling, submission of specimen other than nasopharyngeal swab, presence of viral mutation(s) within the areas targeted by this assay, and inadequate number of viral copies (<131 copies/mL). A negative result must be combined with clinical observations, patient history, and epidemiological information. The expected result is Negative. Fact Sheet for Patients:  PinkCheek.be Fact Sheet for Healthcare Providers:  GravelBags.it This test is not yet ap proved or cleared by the [place] FDA and  has been authorized for detection and/or diagnosis of SARS-CoV-2 by FDA under an Emergency Use Authorization (EUA). This EUA will remain  in effect (meaning this test can be used) for the duration of the COVID-19 declaration under Section 564(b)(1) of the Act, 21 U.S.C. section 360bbb-3(b)(1), unless the authorization is terminated or revoked sooner.    Influenza A by PCR NEGATIVE NEGATIVE Final   Influenza B by PCR  NEGATIVE NEGATIVE Final    Comment: (NOTE) The Xpert Xpress SARS-CoV-2/FLU/RSV assay is intended as an aid in  the diagnosis of influenza from Nasopharyngeal swab specimens and  should not be used as a sole basis for treatment. Nasal washings and  aspirates are  unacceptable for Xpert Xpress SARS-CoV-2/FLU/RSV  testing. Fact Sheet for Patients: PinkCheek.be Fact Sheet for Healthcare Providers: GravelBags.it This test is not yet approved or cleared by the [place] FDA and  has been authorized for detection and/or diagnosis of SARS-CoV-2 by  FDA under an Emergency Use Authorization (EUA). This EUA will remain  in effect (meaning this test can be used) for the duration of the  Covid-19 declaration under Section 564(b)(1) of the Act, 21  U.S.C. section 360bbb-3(b)(1), unless the authorization is  terminated or revoked. Performed at [facility], [address], [place], [place] [address]   Urine culture     Status: Abnormal   Collection Time: 10/16/19 12:52 AM   Specimen: Urine, Random  Result Value Ref Range Status   Specimen Description   Final    URINE, RANDOM Performed at [facility], [address], [place], [place] [address]    Special Requests   Final    NONE Performed at [facility], [address] [place], [place] [address]    Culture (A)  Final    <10,000 COLONIES/mL INSIGNIFICANT GROWTH Performed at [facility], [place] [address], [place],  [address]    Report Status 10/17/2019 FINAL  Final    IMAGING RESULTS:   I have personally reviewed the films ? Impression/Recommendation ? Febrile neutropenia- following chemo-  Improving- on vanco/cefepime/flagyl If no fever in 24 hrs and neutropenia improving can stop antibiotics tomorrow ANC today 1000  Light chain IgA multiple myeloma.  Status post autologous bone marrow transplant in 2016 Relapse currently and  is on daratumumab, pomalidomide and Decadron started on 09/27/2019. Last chemo on 10/06/2019. 10/09/2018  received Covid second vaccine   Anemia  Thrombocytopenia -improving ? Bladder  stimulator? ___________________________________________________ Discussed with patient in detail Note:  This document was prepared using Dragon voice recognition software and may include unintentional dictation errors.

## 2019-10-18 NOTE — Progress Notes (Signed)
PROGRESS NOTE    Sarah [name]  DQQ:[id number] DOB: [birth year] DOA: 10/15/2019 PCP: Glean Hess, MD    Assessment & Plan:   Principal Problem:   Neutropenic fever ([place]) Active Problems:   Multiple myeloma not having achieved remission ([place])   Type II diabetes mellitus with complication (HCC)   Chronic diastolic CHF (congestive heart failure), NYHA class 2 (HCC)   Mild aortic valve stenosis   Benign essential HTN   Stage 3 chronic kidney disease   AKI (acute kidney injury) ([place])   Febrile neutropenia (HCC)    Sarah [name] is a 64 y.o. Caucasian female with medical history significant for stage III multiple myeloma on chemotherapy, diabetes, hypertension, CKD 3, mild aortic stenosis, diastolic heart failure and history of a flutter status post ablation in 1999, who presented to her oncologist office, Dr. Mike Gip earlier today with a complaint of fever of 101.3, chills diarrhea and rash.  She was apparently constipated on the day prior and took lactulose but then developed watery diarrhea.    Neutropenic fever ([place])   Multiple myeloma not having achieved remission (HCC) -ANC of 800 on presentation, trending down.  Temperature at home was 101.3.  At primary oncologist office lactic acid 1.0. -Source uncertain.  Patient had diarrhea PTA but that started after lactulose for constipation, and has had no diarrhea since presentation.  No dysuria.  CXR wnl. -started on IV cefepime, vancomycin and metronidazole.  Flagyl was not continued, and Vanc was d/c'ed per pharm rec.  Both resumed on 4/18 after pt spiked fevers again. --s/p IV hydration --Oncology consulted PLAN: --continue cefepime, vanc, and flagyl -Follow cultures --Trend ANC --Avoid tylenol or Advil in order to follow fevers closely --ID consult today    AKI (acute kidney injury), resolved  superimposed on CKD 3 (HCC) -Creatinine 1.63 above baseline of 1.13 -Suspect related to dehydration from watery diarrhea  and poor oral intake --resolved with IV hydration    Type II diabetes mellitus with complication (HCC) -J9E 6.4, no need for fingersticks or SSI    Chronic diastolic CHF (congestive heart failure), NYHA class 2 (HCC) -Currently euvolemic.  Last EF 50% and October 2020 -Intake and output monitoring    Mild aortic valve stenosis -No acute concerns but monitor in view of IV hydration -Has been seen by Dr. Nehemiah Massed in the past    Benign essential HTN -Blood pressure soft so we will hold home antihypertensives  Hypomag --IV mag PRN   DVT prophylaxis: Lovenox SQ Code Status: Full code  Family Communication:  Disposition Plan: Home, when oncology clears.  Unclear when, as pt still had neutropenic fevers despite being on broad-spectrum abx.   Subjective and Interval History:  Pt continued to spike a fever yesterday, despite being cefepime, so Vanc resumed and flagyl added yesterday.  Pt reported feeling "wiped out."  Complained of new lower abdominal pain, but noted no BM since presentation.  No dyspnea, chest pain, abdominal pain, N/V/D, dysuria.   Objective: Vitals:   10/17/19 2035 10/17/19 2340 10/18/19 0549 10/18/19 0758  BP: (!) 113/57 107/64  (!) 107/57  Pulse: 79 86  75  Resp:  16  18  Temp: (!) 101 F (38.3 C) 100.1 F (37.8 C) 99.3 F (37.4 C) 99.8 F (37.7 C)  TempSrc: Oral Oral Oral Oral  SpO2:  99%  100%  Weight:      Height:        Intake/Output Summary (Last 24 hours) at 10/18/2019 1632 Last data filed at 10/18/2019  1429 Gross per 24 hour  Intake 760 ml  Output --  Net 760 ml   Filed Weights   10/15/19 1749  Weight: 68 kg    Examination:   Constitutional: NAD, AAOx3 HEENT: conjunctivae and lids normal, EOMI CV: RRR 3+ systolic murmur at upper sternal boarder.  Distal pulses +2.  No cyanosis.   RESP: CTA B/L, normal respiratory effort  GI: +BS, ND, soft Extremities: No effusions, edema, or tenderness in BLE SKIN: warm, dry and  intact Neuro: II - XII grossly intact.  Sensation intact Psych: Normal mood and affect.  Appropriate judgement and reason   Data Reviewed: I have personally reviewed following labs and imaging studies  CBC: Recent Labs  Lab 10/15/19 1557 10/15/19 1759 10/16/19 0500 10/17/19 0442 10/18/19 0728  WBC 1.8* 1.7* 1.3*  1.3* 1.9* 2.5*  NEUTROABS 0.8* 0.6* 0.4* 0.7* 1.0*  HGB 10.0* 10.2* 9.2*  9.3* 9.4* 9.4*  HCT 28.8* 29.1* 26.8*  27.0* 28.0* 26.4*  MCV 89.2 88.7 91.2  89.4 89.7 87.4  PLT 86* 91* 70*  74* 78* 84*   Basic Metabolic Panel: Recent Labs  Lab 10/14/19 0903 10/15/19 1557 10/15/19 1759 10/16/19 0500 10/17/19 0442 10/18/19 0728  NA  --  132* 134* 137 137 135  K  --  4.2 4.2 4.3 4.4 4.8  CL  --  102 103 110 108 105  CO2  --  22 23 21* 22 22  GLUCOSE  --  107* 111* 128* 103* 135*  BUN  --  52* 48* 34* 18 16  CREATININE  --  1.60* 1.63* 1.08* 1.05* 0.99  CALCIUM  --  8.1* 8.3* 7.5* 8.4* 8.6*  MG 1.4* 2.2  --   --  1.6* 1.3*   GFR: Estimated Creatinine Clearance: 52.6 mL/min (by C-G formula based on SCr of 0.99 mg/dL). Liver Function Tests: Recent Labs  Lab 10/15/19 1557 10/15/19 1759  AST 17 16  ALT 29 30  ALKPHOS 76 70  BILITOT 0.5 0.8  PROT 6.5 6.4*  ALBUMIN 3.8 3.9   No results for input(s): LIPASE, AMYLASE in the last 168 hours. No results for input(s): AMMONIA in the last 168 hours. Coagulation Profile: No results for input(s): INR, PROTIME in the last 168 hours. Cardiac Enzymes: No results for input(s): CKTOTAL, CKMB, CKMBINDEX, TROPONINI in the last 168 hours. BNP (last 3 results) No results for input(s): PROBNP in the last 8760 hours. HbA1C: Recent Labs    10/16/19 0500  HGBA1C 6.4*   CBG: Recent Labs  Lab 10/16/19 1225 10/16/19 1635 10/16/19 1651  GLUCAP 104* 136* 161*   Lipid Profile: No results for input(s): CHOL, HDL, LDLCALC, TRIG, CHOLHDL, LDLDIRECT in the last 72 hours. Thyroid Function Tests: No results for input(s):  TSH, T4TOTAL, FREET4, T3FREE, THYROIDAB in the last 72 hours. Anemia Panel: No results for input(s): VITAMINB12, FOLATE, FERRITIN, TIBC, IRON, RETICCTPCT in the last 72 hours. Sepsis Labs: Recent Labs  Lab 10/15/19 1558 10/15/19 1759  PROCALCITON  --  0.24  LATICACIDVEN 1.0 0.9    Recent Results (from the past 240 hour(s))  Culture, blood (single) w Reflex to ID Panel     Status: None (Preliminary result)   Collection Time: 10/15/19  3:58 PM   Specimen: BLOOD LEFT HAND  Result Value Ref Range Status   Specimen Description   Final    BLOOD LEFT HAND Performed at [facility], [place] [address] [place], [place] [address]    Special Requests   Final  BOTTLES DRAWN AEROBIC AND ANAEROBIC Blood Culture adequate volume Performed at [facility], [place] [address], [place], [place] [address]    Culture   Final    NO GROWTH 3 DAYS Performed at [facility], [address] [place], [place] [address]    Report Status PENDING  Incomplete  Culture, blood (single) w Reflex to ID Panel     Status: None (Preliminary result)   Collection Time: 10/15/19  3:58 PM   Specimen: BLOOD  Result Value Ref Range Status   Specimen Description   Final    BLOOD PORTA CATH Performed at [facility], [place] [address], [place], [place] [address]    Special Requests   Final    BOTTLES DRAWN AEROBIC AND ANAEROBIC Blood Culture adequate volume Performed at [facility], [place] [address], [place], [place] [address]    Culture   Final    NO GROWTH 3 DAYS Performed at [facility], [address] [place], [place] [address]    Report Status PENDING  Incomplete  Urine Culture     Status: Abnormal   Collection Time: 10/15/19  4:04 PM   Specimen: Urine, Clean Catch  Result Value Ref Range Status   Specimen Description   Final    URINE, CLEAN CATCH Performed at [facility], [address] [place], [place] [address]    Special Requests   Final     NONE Performed at [facility], [address] [place], [place] [address]    Culture (A)  Final    <10,000 COLONIES/mL INSIGNIFICANT GROWTH Performed at [place] [address] [place],  [address]    Report Status 10/17/2019 FINAL  Final  Respiratory Panel by RT PCR (Flu A&B, Covid) - Nasopharyngeal Swab     Status: None   Collection Time: 10/15/19 10:37 PM   Specimen: Nasopharyngeal Swab  Result Value Ref Range Status   SARS Coronavirus 2 by RT PCR NEGATIVE NEGATIVE Final    Comment: (NOTE) SARS-CoV-2 target nucleic acids are NOT DETECTED. The SARS-CoV-2 RNA is generally detectable in upper respiratoy specimens during the acute phase of infection. The lowest concentration of SARS-CoV-2 viral copies this assay can detect is 131 copies/mL. A negative result does not preclude SARS-Cov-2 infection and should not be used as the sole basis for treatment or other patient management decisions. A negative result may occur with  improper specimen collection/handling, submission of specimen other than nasopharyngeal swab, presence of viral mutation(s) within the areas targeted by this assay, and inadequate number of viral copies (<131 copies/mL). A negative result must be combined with clinical observations, patient history, and epidemiological information. The expected result is Negative. Fact Sheet for Patients:  PinkCheek.be Fact Sheet for Healthcare Providers:  GravelBags.it This test is not yet ap proved or cleared by the [place] FDA and  has been authorized for detection and/or diagnosis of SARS-CoV-2 by FDA under an Emergency Use Authorization (EUA). This EUA will remain  in effect (meaning this test can be used) for the duration of the COVID-19 declaration under Section 564(b)(1) of the Act, 21 U.S.C. section 360bbb-3(b)(1), unless the authorization is terminated or revoked sooner.     Influenza A by PCR NEGATIVE NEGATIVE Final   Influenza B by PCR NEGATIVE NEGATIVE Final    Comment: (NOTE) The Xpert Xpress SARS-CoV-2/FLU/RSV assay is intended as an aid in  the diagnosis of influenza from Nasopharyngeal swab specimens and  should not be used as a sole basis for treatment. Nasal washings and  aspirates are unacceptable for Xpert Xpress SARS-CoV-2/FLU/RSV  testing. Fact Sheet for Patients: PinkCheek.be Fact Sheet for Healthcare Providers: GravelBags.it This test is not yet approved or cleared by the [place] FDA and  has been authorized for detection and/or diagnosis of SARS-CoV-2 by  FDA under an Emergency Use Authorization (EUA). This EUA will remain  in effect (meaning this test can be used) for the duration of the  Covid-19 declaration under Section 564(b)(1) of the Act, 21  U.S.C. section 360bbb-3(b)(1), unless the authorization is  terminated or revoked. Performed at [facility], [address], [place], [place] [address]   Urine culture     Status: Abnormal   Collection Time: 10/16/19 12:52 AM   Specimen: Urine, Random  Result Value Ref Range Status   Specimen Description   Final    URINE, RANDOM Performed at [facility], [address], [place], [place] [address]    Special Requests   Final    NONE Performed at [facility], [address] [place], [place] [address]    Culture (A)  Final    <10,000 COLONIES/mL INSIGNIFICANT GROWTH Performed at [facility], [place] [address] [place], [place] [address]    Report Status 10/17/2019 FINAL  Final      Radiology Studies: No results found.   Scheduled Meds: . acyclovir  400 mg Oral BID  . aspirin  81 mg Oral Daily  . Chlorhexidine Gluconate Cloth  6 each Topical Daily  . enoxaparin (LOVENOX) injection  40 mg Subcutaneous Q24H  . fentaNYL  1 patch Transdermal Q72H  . FLUoxetine  40 mg Oral Daily  .  magic mouthwash  10 mL Oral TID   And  . lidocaine  10 mL Mouth/Throat TID  . pantoprazole  40 mg Oral Daily  . polyethylene glycol  17 g Oral BID  . pravastatin  20 mg Oral Daily  . Tbo-filgastrim (GRANIX) SQ  480 mcg Subcutaneous Daily  . traZODone  100 mg Oral QHS   Continuous Infusions: . sodium chloride Stopped (10/16/19 2252)  . ceFEPime (MAXIPIME) IV 2 g (10/18/19 0936)  . metronidazole 500 mg (10/18/19 1017)  . vancomycin       LOS: 3 days     Enzo Bi, MD [facility] If 7PM-7AM, please contact night-coverage 10/18/2019, 4:32 PM

## 2019-10-19 ENCOUNTER — Telehealth: Payer: Self-pay

## 2019-10-19 ENCOUNTER — Other Ambulatory Visit: Payer: Self-pay | Admitting: Hematology and Oncology

## 2019-10-19 DIAGNOSIS — C9000 Multiple myeloma not having achieved remission: Secondary | ICD-10-CM | POA: Diagnosis not present

## 2019-10-19 DIAGNOSIS — R5081 Fever presenting with conditions classified elsewhere: Secondary | ICD-10-CM | POA: Diagnosis not present

## 2019-10-19 DIAGNOSIS — D709 Neutropenia, unspecified: Secondary | ICD-10-CM | POA: Diagnosis not present

## 2019-10-19 DIAGNOSIS — D61818 Other pancytopenia: Secondary | ICD-10-CM | POA: Diagnosis not present

## 2019-10-19 LAB — CBC WITH DIFFERENTIAL/PLATELET
Abs Immature Granulocytes: 0.06 10*3/uL (ref 0.00–0.07)
Basophils Absolute: 0.1 10*3/uL (ref 0.0–0.1)
Basophils Relative: 3 %
Eosinophils Absolute: 0.2 10*3/uL (ref 0.0–0.5)
Eosinophils Relative: 5 %
HCT: 29.1 % — ABNORMAL LOW (ref 36.0–46.0)
Hemoglobin: 10.1 g/dL — ABNORMAL LOW (ref 12.0–15.0)
Immature Granulocytes: 2 %
Lymphocytes Relative: 20 %
Lymphs Abs: 0.7 10*3/uL (ref 0.7–4.0)
MCH: 30.9 pg (ref 26.0–34.0)
MCHC: 34.7 g/dL (ref 30.0–36.0)
MCV: 89 fL (ref 80.0–100.0)
Monocytes Absolute: 1.1 10*3/uL — ABNORMAL HIGH (ref 0.1–1.0)
Monocytes Relative: 32 %
Neutro Abs: 1.4 10*3/uL — ABNORMAL LOW (ref 1.7–7.7)
Neutrophils Relative %: 38 %
Platelets: 95 10*3/uL — ABNORMAL LOW (ref 150–400)
RBC: 3.27 MIL/uL — ABNORMAL LOW (ref 3.87–5.11)
RDW: 13.5 % (ref 11.5–15.5)
Smear Review: NORMAL
WBC: 3.5 10*3/uL — ABNORMAL LOW (ref 4.0–10.5)
nRBC: 0 % (ref 0.0–0.2)

## 2019-10-19 LAB — BASIC METABOLIC PANEL
Anion gap: 8 (ref 5–15)
BUN: 19 mg/dL (ref 8–23)
CO2: 22 mmol/L (ref 22–32)
Calcium: 8.8 mg/dL — ABNORMAL LOW (ref 8.9–10.3)
Chloride: 105 mmol/L (ref 98–111)
Creatinine, Ser: 1.1 mg/dL — ABNORMAL HIGH (ref 0.44–1.00)
GFR calc Af Amer: >60 mL/min (ref >60)
GFR calc non Af Amer: 53 mL/min — ABNORMAL LOW (ref >60)
Glucose, Bld: 128 mg/dL — ABNORMAL HIGH (ref 70–99)
Potassium: 4.6 mmol/L (ref 3.5–5.1)
Sodium: 135 mmol/L (ref 135–145)

## 2019-10-19 LAB — MAGNESIUM: Magnesium: 2.2 mg/dL (ref 1.7–2.4)

## 2019-10-19 MED ORDER — POMALIDOMIDE 3 MG PO CAPS: 3.0000 mg | ORAL_CAPSULE | Freq: Every day | ORAL | 0 refills | Status: DC

## 2019-10-19 NOTE — Progress Notes (Signed)
ID Doing well No fever Patient Vitals for the past 24 hrs:  BP Temp Temp src Pulse Resp SpO2  10/19/19 1610 (!) 87/55 98.2 F (36.8 C) Oral 74 -- 100 %  10/19/19 0819 (!) 110/59 99.2 F (37.3 C) Oral 82 17 97 %  10/19/19 0014 (!) 94/52 98.5 F (36.9 C) Oral 67 16 98 %   O/e No distress  chest CTA HSs 1s2 abd soft CNS non focal Labs CBC Latest Ref Rng & Units 10/19/2019 10/18/2019 10/17/2019  WBC 4.0 - 10.5 K/uL 3.5(L) 2.5(L) 1.9(L)  Hemoglobin 12.0 - 15.0 g/dL 10.1(L) 9.4(L) 9.4(L)  Hematocrit 36.0 - 46.0 % 29.1(L) 26.4(L) 28.0(L)  Platelets 150 - 400 K/uL 95(L) 84(L) 78(L)    CMP Latest Ref Rng & Units 10/19/2019 10/18/2019 10/17/2019  Glucose 70 - 99 mg/dL 128(H) 135(H) 103(H)  BUN 8 - 23 mg/dL 19 16 18   Creatinine 0.44 - 1.00 mg/dL 1.10(H) 0.99 1.05(H)  Sodium 135 - 145 mmol/L 135 135 137  Potassium 3.5 - 5.1 mmol/L 4.6 4.8 4.4  Chloride 98 - 111 mmol/L 105 105 108  CO2 22 - 32 mmol/L 22 22 22   Calcium 8.9 - 10.3 mg/dL 8.8(L) 8.6(L) 8.4(L)  Total Protein 6.5 - 8.1 g/dL - - -  Total Bilirubin 0.3 - 1.2 mg/dL - - -  Alkaline Phos 38 - 126 U/L - - -  AST 15 - 41 U/L - - -  ALT 0 - 44 U/L - - -   Blood culture neg [place] culture neg   Impression/recommendation Febrile neutropenia- has resolved- Dc antibiotics as cultures neg  Multiple myeloma- relapse on chemo  Pancytopenia- improving  ID will sign off- call if needed

## 2019-10-19 NOTE — Plan of Care (Signed)
  Problem: Education: Goal: Knowledge of General Education information will improve Description: Including pain rating scale, medication(s)/side effects and non-pharmacologic comfort measures Outcome: Progressing   Problem: Health Behavior/Discharge Planning: Goal: Ability to manage health-related needs will improve Outcome: Progressing   Problem: Clinical Measurements: Goal: Respiratory complications will improve Outcome: Progressing   Problem: Activity: Goal: Risk for activity intolerance will decrease Outcome: Progressing   Problem: Coping: Goal: Level of anxiety will decrease Outcome: Progressing   

## 2019-10-19 NOTE — Telephone Encounter (Signed)
Spoke with Cloyde Reams at [facility] to inform them to  hold the current script shipment. Dr Mike Gip sent in a new script for Polmast 3 mg for 21 days and 7 days off. Ms Cloyde Reams informed me they will f/u with the patient in 1 week to see if she is better and ready for her shipment. I will continue to f/u with the patient to see when she will be released from the ED. I will inform her pharmacy when she is released so she can receive her shipment.

## 2019-10-19 NOTE — Progress Notes (Signed)
PROGRESS NOTE    Ellieana Dolecki  DUK:[id number] DOB: [birth year] DOA: 10/15/2019 PCP: Glean Hess, MD    Assessment & Plan:   Principal Problem:   Neutropenic fever ([place]) Active Problems:   Multiple myeloma not having achieved remission ([place])   Type II diabetes mellitus with complication (HCC)   Chronic diastolic CHF (congestive heart failure), NYHA class 2 (HCC)   Mild aortic valve stenosis   Benign essential HTN   Stage 3 chronic kidney disease   AKI (acute kidney injury) ([place])   Febrile neutropenia (HCC)    Paxton Kanaan is a 64 y.o. Caucasian female with medical history significant for stage III multiple myeloma on chemotherapy, diabetes, hypertension, CKD 3, mild aortic stenosis, diastolic heart failure and history of a flutter status post ablation in 1999, who presented to her oncologist office, Dr. Mike Gip earlier today with a complaint of fever of 101.3, chills diarrhea and rash.  She was apparently constipated on the day prior and took lactulose but then developed watery diarrhea.    Neutropenic fever ([place])   Multiple myeloma not having achieved remission (HCC) -ANC of 800 on presentation, nadir at 400 then trending up.  Temperature at home was 101.3.  At primary oncologist office lactic acid 1.0. -Source uncertain.  Patient had diarrhea PTA but that started after lactulose for constipation, and has had no diarrhea since presentation.  No dysuria.  CXR wnl. -started on IV cefepime, vancomycin and metronidazole.  Flagyl was not continued, and Vanc was d/c'ed per pharm rec.  Both resumed on 4/18 after pt spiked fevers again. --s/p IV hydration --Oncology consulted, ID consulted PLAN: --d/c cefepime, vanc, and flagyl today, per ID rec, after 24 hours fever-free and ANC trending up (1400 today). -Follow cultures --Avoid tylenol or Advil in order to follow fevers closely --Monitor for fever without abx for at least 1 day before discharge    AKI (acute kidney  injury), resolved  superimposed on CKD 3 (HCC) -Creatinine 1.63 above baseline of 1.13 -Suspect related to dehydration from watery diarrhea and poor oral intake --resolved with IV hydration    Type II diabetes mellitus with complication (HCC) -B7S 6.4, no need for fingersticks or SSI    Chronic diastolic CHF (congestive heart failure), NYHA class 2 (HCC) -Currently euvolemic.  Last EF 50% and October 2020 -Intake and output monitoring    Mild aortic valve stenosis -No acute concerns but monitor in view of IV hydration -Has been seen by Dr. Nehemiah Massed in the past    Benign essential HTN -Blood pressure soft so we will hold home antihypertensives  Hypomag --IV mag PRN   DVT prophylaxis: Lovenox SQ Code Status: Full code  Family Communication:  Disposition Plan: Home, tomorrow, if remains fever-free.   Subjective and Interval History:  Pt reported severe nausea today, but managed not to vomit.  Complained of LLQ tenderness, no BM yet.  No fever for >36 hours.  No dyspnea, chest pain, diarrhea, dysuria.      Objective: Vitals:   10/18/19 1642 10/19/19 0014 10/19/19 0819 10/19/19 1610  BP: 103/85 (!) 94/52 (!) 110/59 (!) 87/55  Pulse: 83 67 82 74  Resp: 16 16 17    Temp: 98.9 F (37.2 C) 98.5 F (36.9 C) 99.2 F (37.3 C) 98.2 F (36.8 C)  TempSrc: Oral Oral Oral Oral  SpO2: 100% 98% 97% 100%  Weight:      Height:        Intake/Output Summary (Last 24 hours) at 10/19/2019 1849 Last data  filed at 10/19/2019 1400 Gross per 24 hour  Intake 120 ml  Output --  Net 120 ml   Filed Weights   10/15/19 1749  Weight: 68 kg    Examination:   Constitutional: NAD, AAOx3 HEENT: conjunctivae and lids normal, EOMI CV: RRR 3+ systolic murmur at upper sternal boarder.  Distal pulses +2.  No cyanosis.   RESP: CTA B/L, normal respiratory effort  GI: +BS, ND, soft Extremities: No effusions, edema, or tenderness in BLE SKIN: warm, dry and intact Neuro: II - XII grossly  intact.  Sensation intact Psych: Normal mood and affect.  Appropriate judgement and reason   Data Reviewed: I have personally reviewed following labs and imaging studies  CBC: Recent Labs  Lab 10/15/19 1759 10/16/19 0500 10/17/19 0442 10/18/19 0728 10/19/19 0649  WBC 1.7* 1.3*  1.3* 1.9* 2.5* 3.5*  NEUTROABS 0.6* 0.4* 0.7* 1.0* 1.4*  HGB 10.2* 9.2*  9.3* 9.4* 9.4* 10.1*  HCT 29.1* 26.8*  27.0* 28.0* 26.4* 29.1*  MCV 88.7 91.2  89.4 89.7 87.4 89.0  PLT 91* 70*  74* 78* 84* 95*   Basic Metabolic Panel: Recent Labs  Lab 10/14/19 0903 10/15/19 1557 10/15/19 1557 10/15/19 1759 10/16/19 0500 10/17/19 0442 10/18/19 0728 10/19/19 0649  NA  --  132*   < > 134* 137 137 135 135  K  --  4.2   < > 4.2 4.3 4.4 4.8 4.6  CL  --  102   < > 103 110 108 105 105  CO2  --  22   < > 23 21* 22 22 22   GLUCOSE  --  107*   < > 111* 128* 103* 135* 128*  BUN  --  52*   < > 48* 34* 18 16 19   CREATININE  --  1.60*   < > 1.63* 1.08* 1.05* 0.99 1.10*  CALCIUM  --  8.1*   < > 8.3* 7.5* 8.4* 8.6* 8.8*  MG 1.4* 2.2  --   --   --  1.6* 1.3* 2.2   < > = values in this interval not displayed.   GFR: Estimated Creatinine Clearance: 47.4 mL/min (A) (by C-G formula based on SCr of 1.1 mg/dL (H)). Liver Function Tests: Recent Labs  Lab 10/15/19 1557 10/15/19 1759  AST 17 16  ALT 29 30  ALKPHOS 76 70  BILITOT 0.5 0.8  PROT 6.5 6.4*  ALBUMIN 3.8 3.9   No results for input(s): LIPASE, AMYLASE in the last 168 hours. No results for input(s): AMMONIA in the last 168 hours. Coagulation Profile: No results for input(s): INR, PROTIME in the last 168 hours. Cardiac Enzymes: No results for input(s): CKTOTAL, CKMB, CKMBINDEX, TROPONINI in the last 168 hours. BNP (last 3 results) No results for input(s): PROBNP in the last 8760 hours. HbA1C: No results for input(s): HGBA1C in the last 72 hours. CBG: Recent Labs  Lab 10/16/19 1225 10/16/19 1635 10/16/19 1651  GLUCAP 104* 136* 161*   Lipid  Profile: No results for input(s): CHOL, HDL, LDLCALC, TRIG, CHOLHDL, LDLDIRECT in the last 72 hours. Thyroid Function Tests: No results for input(s): TSH, T4TOTAL, FREET4, T3FREE, THYROIDAB in the last 72 hours. Anemia Panel: No results for input(s): VITAMINB12, FOLATE, FERRITIN, TIBC, IRON, RETICCTPCT in the last 72 hours. Sepsis Labs: Recent Labs  Lab 10/15/19 1558 10/15/19 1759  PROCALCITON  --  0.24  LATICACIDVEN 1.0 0.9    Recent Results (from the past 240 hour(s))  Culture, blood (single) w Reflex to ID Panel  Status: None (Preliminary result)   Collection Time: 10/15/19  3:58 PM   Specimen: BLOOD LEFT HAND  Result Value Ref Range Status   Specimen Description   Final    BLOOD LEFT HAND Performed at [facility], [place] [address], [place], [place] [address]    Special Requests   Final    BOTTLES DRAWN AEROBIC AND ANAEROBIC Blood Culture adequate volume Performed at [facility], [place] [address], [place], [place] [address]    Culture   Final    NO GROWTH 4 DAYS Performed at [facility], [address] [place], [place] [address]    Report Status PENDING  Incomplete  Culture, blood (single) w Reflex to ID Panel     Status: None (Preliminary result)   Collection Time: 10/15/19  3:58 PM   Specimen: BLOOD  Result Value Ref Range Status   Specimen Description   Final    BLOOD PORTA CATH Performed at [facility], [place] [address] [place], [place] [address]    Special Requests   Final    BOTTLES DRAWN AEROBIC AND ANAEROBIC Blood Culture adequate volume Performed at [facility], [place] [address] [place], [place] [address]    Culture   Final    NO GROWTH 4 DAYS Performed at [facility], [address] [place], [place] [address]    Report Status PENDING  Incomplete  Urine Culture     Status: Abnormal   Collection Time: 10/15/19  4:04 PM   Specimen: Urine, Clean Catch  Result Value Ref Range Status   Specimen Description    Final    URINE, CLEAN CATCH Performed at [facility], [address] [place], [place] [address]    Special Requests   Final    NONE Performed at [facility], [address] [place], [place] [address]    Culture (A)  Final    <10,000 COLONIES/mL INSIGNIFICANT GROWTH Performed at [place] [address], [place], [place] [address]    Report Status 10/17/2019 FINAL  Final  Respiratory Panel by RT PCR (Flu A&B, Covid) - Nasopharyngeal Swab     Status: None   Collection Time: 10/15/19 10:37 PM   Specimen: Nasopharyngeal Swab  Result Value Ref Range Status   SARS Coronavirus 2 by RT PCR NEGATIVE NEGATIVE Final    Comment: (NOTE) SARS-CoV-2 target nucleic acids are NOT DETECTED. The SARS-CoV-2 RNA is generally detectable in upper respiratoy specimens during the acute phase of infection. The lowest concentration of SARS-CoV-2 viral copies this assay can detect is 131 copies/mL. A negative result does not preclude SARS-Cov-2 infection and should not be used as the sole basis for treatment or other patient management decisions. A negative result may occur with  improper specimen collection/handling, submission of specimen other than nasopharyngeal swab, presence of viral mutation(s) within the areas targeted by this assay, and inadequate number of viral copies (<131 copies/mL). A negative result must be combined with clinical observations, patient history, and epidemiological information. The expected result is Negative. Fact Sheet for Patients:  PinkCheek.be Fact Sheet for Healthcare Providers:  GravelBags.it This test is not yet ap proved or cleared by the [place] FDA and  has been authorized for detection and/or diagnosis of SARS-CoV-2 by FDA under an Emergency Use Authorization (EUA). This EUA will remain  in effect (meaning this test can be used) for the duration of the COVID-19  declaration under Section 564(b)(1)  of the Act, 21 U.S.C. section 360bbb-3(b)(1), unless the authorization is terminated or revoked sooner.    Influenza A by PCR NEGATIVE NEGATIVE Final   Influenza B by PCR NEGATIVE NEGATIVE Final    Comment: (NOTE) The Xpert Xpress SARS-CoV-2/FLU/RSV assay is intended as an aid in  the diagnosis of influenza from Nasopharyngeal swab specimens and  should not be used as a sole basis for treatment. Nasal washings and  aspirates are unacceptable for Xpert Xpress SARS-CoV-2/FLU/RSV  testing. Fact Sheet for Patients: PinkCheek.be Fact Sheet for Healthcare Providers: GravelBags.it This test is not yet approved or cleared by the [place] FDA and  has been authorized for detection and/or diagnosis of SARS-CoV-2 by  FDA under an Emergency Use Authorization (EUA). This EUA will remain  in effect (meaning this test can be used) for the duration of the  Covid-19 declaration under Section 564(b)(1) of the Act, 21  U.S.C. section 360bbb-3(b)(1), unless the authorization is  terminated or revoked. Performed at [facility], [address], [place], [place] [address]   Urine culture     Status: Abnormal   Collection Time: 10/16/19 12:52 AM   Specimen: Urine, Random  Result Value Ref Range Status   Specimen Description   Final    URINE, RANDOM Performed at [facility], [address] [place], [place] [address]    Special Requests   Final    NONE Performed at [facility], [address] [place], [place] [address]    Culture (A)  Final    <10,000 COLONIES/mL INSIGNIFICANT GROWTH Performed at [facility], [place] [address], [place], [place] [address]    Report Status 10/17/2019 FINAL  Final      Radiology Studies: DG Abd 1 View  Result Date: 10/18/2019 CLINICAL DATA:  Abdominal pain, diarrhea EXAM: ABDOMEN - 1 VIEW COMPARISON:  None. FINDINGS: The bowel  gas pattern is normal. No large burden of stool. No free air in the abdomen. No radio-opaque calculi or other significant radiographic abnormality are seen. Right-sided sacral stimulator IMPRESSION: Nonobstructive pattern of bowel gas.  No large burden of stool. Electronically Signed   By: Eddie Candle M.D.   On: 10/18/2019 19:06     Scheduled Meds: . acyclovir  400 mg Oral BID  . aspirin  81 mg Oral Daily  . Chlorhexidine Gluconate Cloth  6 each Topical Daily  . enoxaparin (LOVENOX) injection  40 mg Subcutaneous Q24H  . fentaNYL  1 patch Transdermal Q72H  . FLUoxetine  40 mg Oral Daily  . magic mouthwash  10 mL Oral TID   And  . lidocaine  10 mL Mouth/Throat TID  . pantoprazole  40 mg Oral Daily  . polyethylene glycol  17 g Oral BID  . pravastatin  20 mg Oral Daily  . Tbo-filgastrim (GRANIX) SQ  480 mcg Subcutaneous Daily  . traZODone  100 mg Oral QHS   Continuous Infusions: . sodium chloride Stopped (10/16/19 2252)     LOS: 4 days     Enzo Bi, MD [facility] If 7PM-7AM, please contact night-coverage 10/19/2019, 6:49 PM

## 2019-10-20 DIAGNOSIS — I10 Essential (primary) hypertension: Secondary | ICD-10-CM

## 2019-10-20 DIAGNOSIS — N179 Acute kidney failure, unspecified: Secondary | ICD-10-CM

## 2019-10-20 LAB — BASIC METABOLIC PANEL
Anion gap: 7 (ref 5–15)
BUN: 20 mg/dL (ref 8–23)
CO2: 23 mmol/L (ref 22–32)
Calcium: 8.8 mg/dL — ABNORMAL LOW (ref 8.9–10.3)
Chloride: 106 mmol/L (ref 98–111)
Creatinine, Ser: 1.12 mg/dL — ABNORMAL HIGH (ref 0.44–1.00)
GFR calc Af Amer: >60 mL/min (ref >60)
GFR calc non Af Amer: 52 mL/min — ABNORMAL LOW (ref >60)
Glucose, Bld: 136 mg/dL — ABNORMAL HIGH (ref 70–99)
Potassium: 4.6 mmol/L (ref 3.5–5.1)
Sodium: 136 mmol/L (ref 135–145)

## 2019-10-20 LAB — CBC
HCT: 26.9 % — ABNORMAL LOW (ref 36.0–46.0)
Hemoglobin: 9.3 g/dL — ABNORMAL LOW (ref 12.0–15.0)
MCH: 30.9 pg (ref 26.0–34.0)
MCHC: 34.6 g/dL (ref 30.0–36.0)
MCV: 89.4 fL (ref 80.0–100.0)
Platelets: 103 10*3/uL — ABNORMAL LOW (ref 150–400)
RBC: 3.01 MIL/uL — ABNORMAL LOW (ref 3.87–5.11)
RDW: 13.8 % (ref 11.5–15.5)
WBC: 5 10*3/uL (ref 4.0–10.5)
nRBC: 0.6 % — ABNORMAL HIGH (ref 0.0–0.2)

## 2019-10-20 LAB — CULTURE, BLOOD (SINGLE)
Culture: NO GROWTH
Culture: NO GROWTH
Special Requests: ADEQUATE
Special Requests: ADEQUATE

## 2019-10-20 LAB — RPR: RPR Ser Ql: NONREACTIVE

## 2019-10-20 LAB — MAGNESIUM: Magnesium: 1.7 mg/dL (ref 1.7–2.4)

## 2019-10-20 NOTE — Plan of Care (Signed)
  Problem: Education: Goal: Knowledge of General Education information will improve Description: Including pain rating scale, medication(s)/side effects and non-pharmacologic comfort measures Outcome: Progressing   Problem: Health Behavior/Discharge Planning: Goal: Ability to manage health-related needs will improve Outcome: Progressing   Problem: Clinical Measurements: Goal: Ability to maintain clinical measurements within normal limits will improve Outcome: Progressing Goal: Diagnostic test results will improve Outcome: Progressing Goal: Respiratory complications will improve Outcome: Progressing   

## 2019-10-20 NOTE — Progress Notes (Signed)
Patient is stable and ready for discharge. Patient's port was deaccessed by Delana Meyer, RN no issues. Writer went over discharge paperwork with patient and she verbalized understanding. Patient's belongings all packed and with her. Patient transported [facility] to private vehicle with her sister.

## 2019-10-20 NOTE — Discharge Summary (Signed)
Physician Discharge Summary  Patient ID: Sarah [name] MRN: [id number] DOB/AGE: 11-01-1955 64 y.o.  Admit date: 10/15/2019 Discharge date: 10/20/2019  Admission Diagnoses: Neutropenic fever Multiple myeloma Acute kidney injury Type 2 diabetes Chronic diastolic congestive heart failure Mild atrial valve stenosis  essential hypertension Discharge Diagnoses:  Principal Problem:   Neutropenic fever ([place]) Active Problems:   Multiple myeloma not having achieved remission (HCC)   Type II diabetes mellitus with complication (HCC)   Chronic diastolic CHF (congestive heart failure), NYHA class 2 (HCC)   Mild aortic valve stenosis   Benign essential HTN   Stage 3 chronic kidney disease   AKI (acute kidney injury) ([place])   Febrile neutropenia (HCC)   Discharged Condition: good  Hospital Course:  Sarah [name] a 64 y.o.Caucasian femalewith medical history significant forstage III multiple myeloma on chemotherapy, diabetes, hypertension, CKD 3, mild aortic stenosis, diastolic heart failure and history of a flutter status post ablation in 1999, who presented to her oncologist office, Dr. Mike Gip earlier today with a complaint of fever of 101.3, chills diarrhea and rash. She was apparently constipated on the day prior and took lactulose but then developed watery diarrhea.   #1.  Neutropenic fever. Patient neutrophil dropped down to 400, she also developed a fever of 101.3.  She does not appear to have any source of infection, she was initially treated with IV cefepime vancomycin and Flagyl.  He was also seen by infectious disease and oncology.  For the last 48 hours, patient white cell count has normalized.  She no longer have a fever.  Infect disease suggest discontinue all antibiotics.  At this point, patient is medically stable to be discharged.  #2.  Acute kidney injury superimposed on chronic kidney disease stage IIIa. Renal function had improved.  3.  Type 2 diabetes. Current  A1c 6.4, glucose has been stable.  #4. chronic diastolic congestive heart failure. No exacerbation.  Consults: Oncology and infectious disease.  Significant Diagnostic Studies: Blood cultures negative.  Urine culture has a low count of  bacteria.  Treatments: Antibiotics  Discharge Exam: Blood pressure 104/67, pulse 73, temperature 99.9 F (37.7 C), resp. rate 16, height 5' 2"  (1.575 m), weight 68 kg, SpO2 99 %. General appearance: alert and no distress Resp: clear to auscultation bilaterally Cardio: regular rate and rhythm, S1, S2 normal, no murmur, click, rub or gallop GI: soft, non-tender; bowel sounds normal; no masses,  no organomegaly Extremities: extremities normal, atraumatic, no cyanosis or edema Neurologic: Alert and oriented X 3, normal strength and tone. Normal symmetric reflexes. Normal coordination and gait  Disposition: Discharge disposition: 01-Home or Self Care       Discharge Instructions    Diet - low sodium heart healthy   Complete by: As directed    Increase activity slowly   Complete by: As directed      Allergies as of 10/20/2019      Reactions   Oxycodone-acetaminophen Anaphylaxis   Swelling and rash   Celebrex [celecoxib] Diarrhea   Codeine    Benadryl [diphenhydramine] Palpitations   Morphine Itching, Rash   Ondansetron Diarrhea   Tylenol [acetaminophen] Itching, Rash      Medication List    TAKE these medications   acetaminophen 500 MG tablet Commonly known as: TYLENOL Take 500 mg by mouth every 6 (six) hours as needed.   acyclovir 400 MG tablet Commonly known as: ZOVIRAX Take 1 tablet (400 mg total) by mouth 2 (two) times daily.   allopurinol 100 MG tablet Commonly known  as: ZYLOPRIM TAKE 1 TABLET(100 MG) BY MOUTH DAILY as needed   aspirin 81 MG chewable tablet Chew 1 tablet (81 mg total) by mouth daily.   bisoprolol 10 MG tablet Commonly known as: ZEBETA TAKE 1 TABLET(10 MG) BY MOUTH DAILY   dexamethasone 4 MG  tablet Commonly known as: DECADRON Take 5 tablets (8m) on days 2, 9, 16, 23 of cycle 1 and 2 Take 5 tablets (262m on days 2, 16 of cycle 3,4,5 and 6 Take 5 tablets (2032mon days 2 of cycles 7 and beyond   diclofenac sodium 1 % Gel Commonly known as: VOLTAREN Apply 2 g topically 4 (four) times daily.   diphenhydrAMINE 50 MG capsule Commonly known as: BENADRYL Take 50 mg by mouth as needed.   diphenoxylate-atropine 2.5-0.025 MG tablet Commonly known as: LOMOTIL Take 2 tablets by mouth daily as needed for diarrhea or loose stools.   fentaNYL 25 MCG/HR Commonly known as: DURAGESIC Place 25 mcg onto the skin every 3 (three) days.   fexofenadine 180 MG tablet Commonly known as: ALLEGRA TAKE 1 TABLET(180 MG) BY MOUTH DAILY   FLUoxetine 40 MG capsule Commonly known as: PROZAC TAKE 1 CAPSULE EVERY DAY   fluticasone 50 MCG/ACT nasal spray Commonly known as: FLONASE Place 2 sprays into both nostrils daily.   furosemide 20 MG tablet Commonly known as: LASIX Take 20 mg by mouth as needed.   HAIR/SKIN/NAILS/BIOTIN PO Take 1 capsule by mouth 3 (three) times daily.   hydrOXYzine 10 MG tablet Commonly known as: ATARAX/VISTARIL TAKE 1 TABLET(10 MG) BY MOUTH THREE TIMES DAILY AS NEEDED   lidocaine-prilocaine cream Commonly known as: EMLA APPLY TOPICALLY AS NEEDED.   lisinopril 10 MG tablet Commonly known as: ZESTRIL Take 10 mg by mouth daily.   Mag Glycinate 100 MG Tabs Take 100 mg by mouth 2 (two) times daily.   metFORMIN 500 MG 24 hr tablet Commonly known as: GLUCOPHAGE-XR Take 1 tablet (500 mg total) by mouth daily with breakfast.   montelukast 10 MG tablet Commonly known as: Singulair Take 1 tablet by mouth on the day before treatment and for 2 days after treatment.   Narcan 4 MG/0.1ML Liqd nasal spray kit Generic drug: naloxone 1 spray.   NON FORMULARY   Omega 3-6-9 Complex Caps Take by mouth.   omeprazole 40 MG capsule Commonly known as: PRILOSEC Take  1 capsule (40 mg total) by mouth daily.   ONE TOUCH ULTRA TEST test strip Generic drug: glucose blood   OSTEO BI-FLEX ADV TRIPLE ST PO Take 2 tablets by mouth daily.   pomalidomide 3 MG capsule Commonly known as: Pomalyst Take 1 capsule (3 mg total) by mouth daily. CelFanny Dance [id number]   Date Obtained 10/19/2019 What changed:   medication strength  See the new instructions.   pravastatin 20 MG tablet Commonly known as: PRAVACHOL TAKE 1 TABLET EVERY DAY   promethazine 25 MG tablet Commonly known as: PHENERGAN Take 1 tablet (25 mg total) by mouth 2 (two) times daily as needed for nausea or vomiting.   promethazine-dextromethorphan 6.25-15 MG/5ML syrup Commonly known as: PROMETHAZINE-DM Take 5 mLs by mouth 4 (four) times daily as needed for cough.   tiZANidine 4 MG tablet Commonly known as: ZANAFLEX Take 1 tablet (4 mg total) by mouth every 8 (eight) hours as needed for muscle spasms.   traZODone 100 MG tablet Commonly known as: DESYREL TAKE 1 TABLET AT BEDTIME  FOR  SLEEP  Follow-up Information    Glean Hess, MD Follow up in 1 week(s).   Specialty: Internal Medicine Contact information: [address] [place] [address] [phone]        Lequita Asal, MD Follow up.   Specialty: Hematology and Oncology Why: as scheduled on Monday Contact information: [place] [address] [phone]         More than 30 minutes  Signed: Sharen Hones 10/20/2019, 2:49 PM

## 2019-10-21 ENCOUNTER — Telehealth: Payer: Self-pay

## 2019-10-21 ENCOUNTER — Encounter: Payer: Self-pay | Admitting: Hematology and Oncology

## 2019-10-21 ENCOUNTER — Other Ambulatory Visit: Payer: Self-pay

## 2019-10-21 ENCOUNTER — Inpatient Hospital Stay: Payer: Medicare PPO

## 2019-10-21 NOTE — Progress Notes (Addendum)
[facility]  [address] [place], [place] [address] Phone: [phone]  Fax: [phone]   Clinic Day:  10/25/2019  Referring physician: Glean Hess, MD  Chief Complaint: Sarah [name] is a 64 y.o. 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    HPI:  The patient was last seen in the medical oncology clinic on 10/22/2019.  At that time she was day 19 of cycle #1 DPd.  She had just been discharged from [facility] with fever and neutropenia.  Cultures were negative.  Counts had recovered.  She was off antibiotics.  She denied any fever.  She received IV magnesium in clinic.  She was seen by Dr Fatima Sanger at [facility] on 10/22/2019.  Hematocrit was 30.9, hemoglobin 10.4, MCV 91.4, platelets 153,000, WBC 7000, ANC 5400.  Creatinine was 1.07.  Magnesium was 1.0.  IgG was 281, IgM < 25, and IgA 31.3. Lambda free light chains were 4.18 (ratio 0.12).  Beta2-microglobulin was 3.79.  BNP was 426.0.  Plan was for continuation of DPd with decrease in Pomalyst to 3 mg a day.  During the interim, she has been doing "ok". She feels like she is gaining her strength back since hospital admission. Her weight is down 3 lbs since 10/22/2019. She had no appetite and supplemental shakes are "too sweet" and make her feel nauseous. She ate rice and 2.5 chicken fingers. She feels nauseous if she eats a big meal. She is trying to eat small frequent meals. She drank an Atkins diet drink yesterday for the nutrition. Patient declined nutrition consult at this time.   Her dry cough is better, but stills lingers. She has exertional shortness of breath. She has a sore and itchy throat. Her neuropathy is stable. She has patchy itching rashes on her right hand. She is using cream for her hands. She reports having a dermatologist. The patient took her Singular this morning. Patient noted she may  need nausea premeds before treatment today. Her BP medications have been held per cardiologist request.   I informed patient to fill out her Pomalyst survey today to receive her next cycle of Pomalyst.    Past Medical History:  Diagnosis Date  . Abnormal stress test 02/14/2016   Overview:  Added automatically from request for surgery [id number]  . Anemia   . Anxiety   . Arthritis   . Bicuspid aortic valve   . CHF (congestive heart failure) ([place])   . CKD (chronic kidney disease) stage 3, GFR 30-59 ml/min   . Depression   . Diabetes mellitus ([place])   . Dizziness   . Fatty liver   . Frequent falls   . GERD (gastroesophageal reflux disease)   . Gout   . Heart murmur   . History of blood transfusion   . History of bone marrow transplant ([place])   . History of uterine fibroid   . Hx of cardiac catheterization 06/05/2016   Overview:  Normal coronaries 2017  . Hypertension   . Hypomagnesemia   . Multiple myeloma ([place])   . Personal history of chemotherapy   . Renal cyst     Past Surgical History:  Procedure Laterality Date  . ABDOMINAL HYSTERECTOMY    . 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 CARDIAC ELECTROPHYSIOLOGY MAPPING AND ABLATION    . CARPAL TUNNEL RELEASE Bilateral   . CHOLECYSTECTOMY  2008  .  COLONOSCOPY WITH PROPOFOL N/A 05/07/2017   Procedure: COLONOSCOPY WITH PROPOFOL;  Surgeon: [name], MD;  Location: [facility] ENDOSCOPY;  Service: Gastroenterology;  Laterality: N/A;  . ESOPHAGOGASTRODUODENOSCOPY (EGD) WITH PROPOFOL N/A 05/07/2017   Procedure: ESOPHAGOGASTRODUODENOSCOPY (EGD) WITH PROPOFOL;  Surgeon: [name], MD;  Location: [facility] ENDOSCOPY;  Service: Gastroenterology;  Laterality: N/A;  . FOOT SURGERY Bilateral   . INCONTINENCE SURGERY  2009  . INTERSTIM IMPLANT PLACEMENT    . other     over active bladder  . OTHER SURGICAL HISTORY     bladder stimulator   . PARTIAL HYSTERECTOMY  03/1996   fibroids  . PORTA CATH INSERTION N/A 03/10/2019   Procedure: PORTA CATH  INSERTION;  Surgeon: Algernon Huxley, MD;  Location: [place] CV LAB;  Service: Cardiovascular;  Laterality: N/A;  . TONSILLECTOMY  2007    Family History  Problem Relation Age of Onset  . Colon cancer Father   . Renal Disease Father   . Diabetes Mellitus II Father   . Melanoma Paternal Grandmother   . Breast cancer Maternal Aunt 8  . Anemia Mother   . Heart disease Mother   . Heart failure Mother   . Renal Disease Mother   . Congestive Heart Failure Mother   . Heart disease Maternal Uncle   . Throat cancer Maternal Uncle   . Lung cancer Maternal Uncle   . Liver disease Maternal Uncle   . Heart failure Maternal Uncle   . Hearing loss Son 59       Suicide     Social History:  reports that she quit smoking about 28 years ago. Her smoking use included cigarettes. She has a 20.00 pack-year smoking history. She has never used smokeless tobacco. She reports previous alcohol use. She reports that she does not use drugs. Patient has not had ETOH in several months. She is on disability. She notes exposure to perchloroethylene [facility]).She lives much of her adult life in [place]. She was married with 2 sons. Her husband passed away. Her 2 sons took their own lives (age 26 and 10). She worked in [employer] in [employer]. She went back to school and earned an [employer] and works for [employer] for several years.She lives[place] her sistersin [place]. The patient is alone today.  Allergies:  Allergies  Allergen Reactions  . Oxycodone-Acetaminophen Anaphylaxis    Swelling and rash  . Celebrex [Celecoxib] Diarrhea  . Codeine   . Benadryl [Diphenhydramine] Palpitations  . Morphine Itching and Rash  . Ondansetron Diarrhea  . Tylenol [Acetaminophen] Itching and Rash   Current Medications: Current Outpatient Medications  Medication Sig Dispense Refill  . acetaminophen (TYLENOL) 500 MG tablet Take 500 mg by mouth every 6 (six) hours as needed.    Marland Kitchen acyclovir (ZOVIRAX)  400 MG tablet Take 1 tablet (400 mg total) by mouth 2 (two) times daily. 60 tablet 11  . allopurinol (ZYLOPRIM) 100 MG tablet TAKE 1 TABLET(100 MG) BY MOUTH DAILY as needed 90 tablet 1  . aspirin 81 MG chewable tablet Chew 1 tablet (81 mg total) by mouth daily. 30 tablet 0  . dexamethasone (DECADRON) 4 MG tablet Take 5 tablets (11m) on days 2, 9, 16, 23 of cycle 1 and 2 Take 5 tablets (251m on days 2, 16 of cycle 3,4,5 and 6 Take 5 tablets (2020mon days 2 of cycles 7 and beyond 120 tablet 0  . diclofenac sodium (VOLTAREN) 1 % GEL Apply 2 g topically 4 (four) times daily.  100 g 1  . diphenhydrAMINE (BENADRYL) 50 MG capsule Take 50 mg by mouth as needed.    . fentaNYL (DURAGESIC - DOSED MCG/HR) 25 MCG/HR patch Place 25 mcg onto the skin every 3 (three) days.     . fexofenadine (ALLEGRA) 180 MG tablet TAKE 1 TABLET(180 MG) BY MOUTH DAILY 30 tablet 5  . FLUoxetine (PROZAC) 40 MG capsule TAKE 1 CAPSULE EVERY DAY 90 capsule 1  . fluticasone (FLONASE) 50 MCG/ACT nasal spray Place 2 sprays into both nostrils daily. 16 g 2  . furosemide (LASIX) 20 MG tablet Take 20 mg by mouth as needed.     Marland Kitchen glucose blood (ONE TOUCH ULTRA TEST) test strip     . lidocaine-prilocaine (EMLA) cream APPLY TOPICALLY AS NEEDED. 30 g 0  . Magnesium Bisglycinate (MAG GLYCINATE) 100 MG TABS Take 100 mg by mouth 2 (two) times daily. 60 tablet 0  . metFORMIN (GLUCOPHAGE-XR) 500 MG 24 hr tablet Take 1 tablet (500 mg total) by mouth daily with breakfast. 90 tablet 1  . Misc Natural Products (OSTEO BI-FLEX ADV TRIPLE ST PO) Take 2 tablets by mouth daily.    . montelukast (SINGULAIR) 10 MG tablet Take 1 tablet by mouth on the day before treatment and for 2 days after treatment. 30 tablet 0  . Multiple Vitamins-Minerals (HAIR/SKIN/NAILS/BIOTIN PO) Take 1 capsule by mouth 3 (three) times daily.    Marland Kitchen NARCAN 4 MG/0.1ML LIQD nasal spray kit 1 spray.    . NON FORMULARY     . Omega 3-6-9 Fatty Acids (OMEGA 3-6-9 COMPLEX) CAPS Take by  mouth.    Marland Kitchen omeprazole (PRILOSEC) 40 MG capsule Take 1 capsule (40 mg total) by mouth daily. 90 capsule 1  . pomalidomide (POMALYST) 3 MG capsule Take 1 capsule (3 mg total) by mouth daily. [employer] Auth #  [id number]      Date Obtained 10/19/2019 21 capsule 0  . pravastatin (PRAVACHOL) 20 MG tablet TAKE 1 TABLET EVERY DAY 90 tablet 1  . promethazine (PHENERGAN) 25 MG tablet Take 1 tablet (25 mg total) by mouth 2 (two) times daily as needed for nausea or vomiting. 60 tablet 0  . tiZANidine (ZANAFLEX) 4 MG tablet Take 1 tablet (4 mg total) by mouth every 8 (eight) hours as needed for muscle spasms. 270 tablet 1  . traZODone (DESYREL) 100 MG tablet TAKE 1 TABLET AT BEDTIME  FOR  SLEEP 90 tablet 1  . bisoprolol (ZEBETA) 10 MG tablet TAKE 1 TABLET(10 MG) BY MOUTH DAILY (Patient not taking: Reported on 10/25/2019) 90 tablet 0  . diphenoxylate-atropine (LOMOTIL) 2.5-0.025 MG tablet Take 2 tablets by mouth daily as needed for diarrhea or loose stools. (Patient not taking: Reported on 10/25/2019) 45 tablet 2  . hydrOXYzine (ATARAX/VISTARIL) 10 MG tablet TAKE 1 TABLET(10 MG) BY MOUTH THREE TIMES DAILY AS NEEDED (Patient not taking: Reported on 10/25/2019) 90 tablet 0  . lactulose (CHRONULAC) 10 GM/15ML solution     . lisinopril (PRINIVIL,ZESTRIL) 10 MG tablet Take 10 mg by mouth daily.    . promethazine-dextromethorphan (PROMETHAZINE-DM) 6.25-15 MG/5ML syrup Take 5 mLs by mouth 4 (four) times daily as needed for cough. (Patient not taking: Reported on 10/25/2019) 240 mL 0   No current facility-administered medications for this visit.   Facility-Administered Medications Ordered in Other Visits  Medication Dose Route Frequency Provider Last Rate Last Admin  . heparin lock flush 100 unit/mL  500 Units Intravenous Once Narvel Kozub, Drue Second, MD      . sodium chloride  flush (NS) 0.9 % injection 10 mL  10 mL Intravenous PRN Lequita Asal, MD   10 mL at 10/25/19 0851    Review of Systems  Constitutional: Negative  for chills, diaphoresis, fever, malaise/fatigue (majority of the time) and weight loss (3 lbs).       Doing ok. Gaining strength back.  HENT: Positive for sore throat (associated itching). Negative for congestion, ear discharge, ear pain, hearing loss, nosebleeds and sinus pain.   Eyes: Negative.  Negative for blurred vision, double vision and photophobia.  Respiratory: Positive for cough (lingering; dry) and shortness of breath (exertional). Negative for hemoptysis and sputum production.   Cardiovascular: Negative.  Negative for chest pain, palpitations, orthopnea and leg swelling.  Gastrointestinal: Positive for nausea (chronic s/p gallbladder surgery; worsening). Negative for abdominal pain, blood in stool, constipation, diarrhea, heartburn (controlled on medication), melena and vomiting.       Eating small frequent meals.  Genitourinary: Negative for dysuria, frequency, hematuria and urgency.       Bladder leakage and spasms.  Musculoskeletal: Positive for back pain (chronic), joint pain (arthritis) and myalgias (leg cramps). Negative for neck pain.  Skin: Positive for itching (hands) and rash (hands).       Dry patches between fingers, resolving with hand cream.  Neurological: Positive for sensory change (chronic numbness in fingers and toes). Negative for dizziness, tremors, speech change, focal weakness, weakness and headaches.  Endo/Heme/Allergies: Does not bruise/bleed easily.       Diabetes.  Psychiatric/Behavioral: Negative.  Negative for depression and memory loss. The patient is not nervous/anxious and does not have insomnia.   All other systems reviewed and are negative.  Performance status (ECOG):  1  Vitals Blood pressure 101/70, pulse (!) 102, resp. rate 18, weight 145 lb 9.8 oz (66 kg).   Physical Exam  Constitutional: She is oriented to person, place, and time. She appears well-developed and well-nourished.  Slightly fatigued appearing woman sitting comfortably in the  exam room in no acute distress.  HENT:  Head: Normocephalic and atraumatic.  Mouth/Throat: Oropharynx is clear and moist. Mucous membranes are dry. No oropharyngeal exudate.  Curly dark blonde. Dentures. Mask.  Eyes: Pupils are equal, round, and reactive to light. Conjunctivae and EOM are normal. No scleral icterus.  Glasses. Brown eyes.  Neck: No JVD present.  Cardiovascular: Normal rate, regular rhythm and normal heart sounds.  No murmur heard. Pulmonary/Chest: Effort normal and breath sounds normal. No respiratory distress. She has no wheezes. She has no rales. She exhibits no tenderness.  Abdominal: Soft. Bowel sounds are normal. She exhibits no distension and no mass. There is no abdominal tenderness. There is no rebound and no guarding.  Musculoskeletal:        General: Tenderness (bilateral lower extremities) present. No edema. Normal range of motion.     Cervical back: Normal range of motion and neck supple.  Lymphadenopathy:       Head (right side): No preauricular, no posterior auricular and no occipital adenopathy present.       Head (left side): No preauricular, no posterior auricular and no occipital adenopathy present.    She has no cervical adenopathy.    She has no axillary adenopathy.       Right: No inguinal and no supraclavicular adenopathy present.       Left: No inguinal and no supraclavicular adenopathy present.  Neurological: She is alert and oriented to person, place, and time.  Skin: Skin is warm and dry. Rash (between fingers)  noted. She is not diaphoretic. No erythema. No pallor.  Psychiatric: She has a normal mood and affect. Her behavior is normal. Judgment and thought content normal.  Nursing note and vitals reviewed.      Infusion on 10/25/2019  Component Date Value Ref Range Status  . Sodium 10/25/2019 136  135 - 145 mmol/L Final  . Potassium 10/25/2019 4.0  3.5 - 5.1 mmol/L Final  . Chloride 10/25/2019 102  98 - 111 mmol/L Final  . CO2 10/25/2019  22  22 - 32 mmol/L Final  . Glucose, Bld 10/25/2019 163* 70 - 99 mg/dL Final   Glucose reference range applies only to samples taken after fasting for at least 8 hours.  . BUN 10/25/2019 16  8 - 23 mg/dL Final  . Creatinine, Ser 10/25/2019 1.20* 0.44 - 1.00 mg/dL Final  . Calcium 10/25/2019 9.2  8.9 - 10.3 mg/dL Final  . Total Protein 10/25/2019 7.0  6.5 - 8.1 g/dL Final  . Albumin 10/25/2019 4.3  3.5 - 5.0 g/dL Final  . AST 10/25/2019 29  15 - 41 U/L Final  . ALT 10/25/2019 33  0 - 44 U/L Final  . Alkaline Phosphatase 10/25/2019 80  38 - 126 U/L Final  . Total Bilirubin 10/25/2019 0.4  0.3 - 1.2 mg/dL Final  . GFR calc non Af Amer 10/25/2019 48* >60 mL/min Final  . GFR calc Af Amer 10/25/2019 56* >60 mL/min Final  . Anion gap 10/25/2019 12  5 - 15 Final   Performed at [facility], [address] [place], [place] [address]  . WBC 10/25/2019 3.9* 4.0 - 10.5 K/uL Final  . RBC 10/25/2019 3.31* 3.87 - 5.11 MIL/uL Final  . Hemoglobin 10/25/2019 10.1* 12.0 - 15.0 g/dL Final  . HCT 10/25/2019 29.9* 36.0 - 46.0 % Final  . MCV 10/25/2019 90.3  80.0 - 100.0 fL Final  . MCH 10/25/2019 30.5  26.0 - 34.0 pg Final  . MCHC 10/25/2019 33.8  30.0 - 36.0 g/dL Final  . RDW 10/25/2019 14.6  11.5 - 15.5 % Final  . Platelets 10/25/2019 148* 150 - 400 K/uL Final  . nRBC 10/25/2019 0.0  0.0 - 0.2 % Final  . Neutrophils Relative % 10/25/2019 63  % Final  . Neutro Abs 10/25/2019 2.5  1.7 - 7.7 K/uL Final  . Lymphocytes Relative 10/25/2019 21  % Final  . Lymphs Abs 10/25/2019 0.8  0.7 - 4.0 K/uL Final  . Monocytes Relative 10/25/2019 9  % Final  . Monocytes Absolute 10/25/2019 0.4  0.1 - 1.0 K/uL Final  . Eosinophils Relative 10/25/2019 2  % Final  . Eosinophils Absolute 10/25/2019 0.1  0.0 - 0.5 K/uL Final  . Basophils Relative 10/25/2019 2  % Final  . Basophils Absolute 10/25/2019 0.1  0.0 - 0.1 K/uL Final  . Immature Granulocytes 10/25/2019 3  % Final  . Abs Immature Granulocytes  10/25/2019 0.10* 0.00 - 0.07 K/uL Final   Performed at [facility], [address], [place], [place] [address]  . Magnesium 10/25/2019 1.3* 1.7 - 2.4 mg/dL Final   Performed at [facility], [address] [place], [place] [address]    Assessment:  Sarah [name] is a 64 y.o. 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 Bone marrow revealed 80% plasma cells. Lambda free light chains were 1340. She had nephrotic range proteinuria. She initially underwent induction with  RVD. Revlimid maintenance was discontinued on 01/21/2017 secondary to intolerance.   Bone marrowaspirate andbiopsyon 01/18/2021revealed anormocellularmarrow withbut increased lambda-restricted plasma cells (9% aspirate, 40% CD138 immunohistochemistry).Findingswereconsistent with recurrent plasma cell myeloma.Flow cytometry revealed no monoclonal B-cell or phenotypically aberrant T-cell population. Cytogeneticswere 67, XX (normal). 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  M-spikehas been followed: 0 on 04/02/2016 -06/23/2019; and0.2 on 09/02/2017.  Lambda light chainshave been followed: 22.2 (ratio 0.56) on 07/03/2017, 30.8 (ratio 0.78) on 09/02/2017, 36.9 (ratio 0.40) on 10/21/2017, 37.4 (ratio 0.41) on 12/16/2017, 70.7(ratio 0.31) on 02/17/2018, 64.2 (ratio 0.27) on 04/07/2018, 78.9 (ratio 0.18) on 05/26/2018, 128.8 (ratio 0.17) on 08/06/2018, 181.5 (ratio 0.13) on 10/08/2018, 130.9 (ratio 0.13) on 10/20/2018, 160.7 (ratio 0.10)on 12/09/2018, 236.6 (ratio 0.07) on 02/01/2019, 363.6 (ratio 0.04) on 03/22/2019, 404.8 (ratio 0.04) on 04/05/2019, 420.7 (ratio 0.03) on 05/24/2019, 573.4 (ratio 0.03) on 06/23/2019, 451.05(ratio 0.02) on02/19/2021, 47.2 (ratio 0.13) on 10/25/2019.  24 hour UPEPon 06/03/2019 revealed kappa free light chains95.76,lambda free light  chains1,260.71, andratio 0.08.24 hourUPEPon 02/22/2021revealed totalproteinof767m/24 hrs withlambdafree light chains 1,084.126mL andratioof0.10(1.03-31.76). M spike inurinewas46.1%(36138m4 hrs).  Bone surveyon 04/08/2016 and11/28/2018 revealed no definite lytic lesion seen in the visualized skeleton.Bone surveyon 11/19/2018 revealed no suspicious lucent lesionsand no acute bony abnormality.PET scanon 07/12/2019 revealed no focal metabolic activity to suggest active myeloma within the skeleton. There wereno lytic lesions identified on the CT portion of the examorsoft tissue plasmacytomas. There was no evidence of multiple myeloma.  Pretreatment RBC phenotype on 09/23/2019 was positive for C, e, DUFFY B, KIDD B, M, S, and s antigen; negative for c, E, KELL, DUFFY A, KIDD A, and N antigen.   She is day 1 of cycle #2 daratumumab and hyaluronidase-fihj, Pomalyst, and Decadron (DPd) (09/27/2019 - 10/25/2019).   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 Zometa was discontinued in 01/2017. She has chronic nauseaon Phenergan.  She has B12 deficiency. B12 was 254 on 04/09/2017, 295 on 08/20/2018, and 391 on 10/08/2018. Shewasonoral B12.She received B12 monthly (last04/07/2019).Folate was 18.6 on 02/08/2019.  She has iron deficiency. Ferritin was 32 on 07/01/2019. She received Venoferon 07/15/2019 and 07/22/2019.  She received her second COVID-19vaccineon 10/09/2019.  Symptomatically, she has nausea today.  Neuropathy is stable.  Exam is stable.  Plan: 1.   Labs today: cbc with diff, CMP, Mg, FLCA. 2.   Stage III multiple myeloma Clinically, she is doing well.  [facility].   Lambda free light chains have improved from 451.05on02/19/2021 to 47.2 today.  She is day 1 of cycle #2 daratumumab plus Pomalyst and dexamethasone (DPd).  She appears to be  tolerating her treatment fairly well.     She has no increased neuropathy.  Labs reviewed.  Counts are adequate.  Day 1 of cycle #2 daratumumab SQ today.   Premeds today in clinic as patient did not take at home.  Complete Pomalyst survey.  Begin Pomalyst on arrival.    Cycle #2 Pomalyst will not extend beyond day 21. 3. Normocytic anemia Hematocrit29.9. Hemoglobin10.1. MCV 90.3.Platelets148,000today. Ferritin128 on 10/04/2019. TSH was normal on02/20/2020  Creatinine 1.20(CrCl42.8ml1mnute).             Etiology felt secondary to myeloma and treatment. Continue to monitor. 4. Grade II peripheral neuropathy Etiologysecondary toprior chemotherapy. No current evidence of progression.             Continue to monitor closely. 5.B12 deficiency B12 injections began on 02/15/2019. Continue B12 monthly (due 10/28/2019). Folate was18.6on 02/08/2019.  Check folate annually. 6.   Hypomagnesemia, chronic Magnesium 1.3 today.  Magnesium 4 g IV today.    Continue IV magnesium supplementation 2 times a week.  7.   Rash  Unusual rash between fingers of unclear etiology.    Discussed follow-up with the dermatology. 8.   Nausea  Patient has chronic nausea relieved only with Phenergan.  Phenergan 25 mg IV today secondary to nausea.  Patient declined a granisetron patch secondary to interaction with Trazodone. 9.   RTC on Thursday for labs (Mg) and +/- IV Mg. 10.   RTC in 1 week for MD assessment, labs (CBC with diff, CMP, Mg), IV Mg, and day 8 of cycle #2 daratumumab SQ.  I discussed the assessment and treatment plan with the patient.  The patient was provided an opportunity to ask questions and all were answered.  The patient agreed with the plan and demonstrated an understanding of the  instructions.  The patient was advised to call back if the symptoms worsen or if the condition fails to improve as anticipated.  Fransico Him, am acting as Education administrator for [employer]. Mike Gip, MD, PhD.   Lequita Asal, MD, PhD    10/25/2019, 9:23 AM

## 2019-10-21 NOTE — Telephone Encounter (Signed)
Transition Care Management Follow-up Telephone Call  Date of discharge and from where: 10/20/19 [facility]  How have you been since you were released from the hospital? Pt states she still feels weak and shaky with slight SOB on exertion.   Any questions or concerns? No    Items Reviewed:  Did the pt receive and understand the discharge instructions provided? Yes   Medications obtained and verified? Yes  Pt was advised hold BP meds for now due to drop in blood pressure  Any new allergies since your discharge? No   Dietary orders reviewed? Yes  Do you have support at home? Yes   Functional Questionnaire: (I = Independent and D = Dependent) ADLs: I  Bathing/Dressing- I  Meal Prep- I  Eating- I  Maintaining continence- I  Transferring/Ambulation- I  Managing Meds- I  Follow up appointments reviewed:   PCP Hospital f/u appt confirmed? Yes  Scheduled to see Dr. Army Melia on 10/27/19 @ 9:20.  [facility] f/u appt confirmed? Yes  Scheduled to see Dr. Mike Gip on 10/25/19  Are transportation arrangements needed? No   If their condition worsens, is the pt aware to call PCP or go to the Emergency Dept.? Yes  Was the patient provided with contact information for the PCP's office or ED? Yes  Was to pt encouraged to call back with questions or concerns? Yes

## 2019-10-21 NOTE — Progress Notes (Signed)
[facility]  [address], [address] [place], [place] [address] Phone: [phone]  Fax: [phone]   Clinic Day:  10/22/2019  Referring physician: Glean Hess, MD  Chief Complaint: Sarah [name] is a 64 y.o. 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   HPI: The patient was last seen in the medical oncology clinic on 10/15/2019. At that time, she had a fever and chills. Temperature was 100.1. She had profuse diarrhea since lactulose on 10/12/2019. She had a slight cough. Hematocrit was 28.8, hemoglobin 10.0, MCV 89.2, platelets 86,000, WBC 1800, ANC 800. Magnesium was 2.2.  She was admitted to [facility] from 10/15/2019 - 10/20/2019 for fever and neutropenia.  Cultures were negative.  CXR was negative. Plain films of the abdomen on 10/18/2019 revealed nonobstructive pattern of bowel gas.  She was seen by infectious disease.  She received broad spectrum antibiotics and daily Granix.  She received IVF for acute renal insufficiency due to diarrhea and dehydration.  Creatine was 1.66 on admission and 1.12 on discharge.  Magnesium was 1.7 on discharge.  She received IV magnesium was hospitalized.  CBC on discharge included a hematocrit of 26.9, hemoglobin 9.3, MCV 89.4, platelets 103,000, and WBC 5000.  Labs: 10/16/2019: Hematocrit 27.0, hemoglobin 9.3, MCV 89.4, platelets 74,000, WBC 1300. 10/17/2019: Hematocrit 28.0, hemoglobin 9.4, MCV 89.7, platelets 78,000, WBC 1900, ANC 700.  10/18/2019: Hematocrit 26.4, hemoglobin 9.4, MCV 87.4, platelets 84,000, WBC 2500, ANC 1000.  10/19/2019: Hematocrit 29.1, hemoglobin 10.1, MCV 89.0, platelets 95,000, WBC 3500, ANC 1400.  10/20/2019: Hematocrit 26.9, hemoglobin 9.3, MCV 89.4, platelets 103,000, WBC 5000.   She saw Dr. Fatima Sanger at [facility] this morning for her multiple myeloma. Pomalidomide dose was reduced  to 3 mg for this cycle given her recent neutropenic fever.  Labs revealed a hematocrit 30.9, hemoglobin 10.4, MCV 91.4, platelets 153,000, WBC 7000, ANC 5400. Alkaline phosphatase was 99. Total protein was 6.0. IgG was 281. Lambda free light chains were 4.18 with a ratio of 0.12.  LDH was 406.  Beta2-microglobulin was 3.79. Magnesium was 1.0 (low).  BNP was 426 (0-226).   She feels fatigued. She has a sore throat. She has been feeling nauseous. She has been forcing herself to eat but it is difficult for her. She has not had any bowel movement since last Thursday 10/14/2019. She has been taking Miralax but it has not been helping. Her neuropathy is about the same.   She is scheduled to see her cardiologist on 10/26/2019.    Past Medical History:  Diagnosis Date  . Abnormal stress test 02/14/2016   Overview:  Added automatically from request for surgery [id number]  . Anemia   . Anxiety   . Arthritis   . Bicuspid aortic valve   . CHF (congestive heart failure) ([place])   . CKD (chronic kidney disease) stage 3, GFR 30-59 ml/min   . Depression   . Diabetes mellitus ([place])   . Dizziness   . Fatty liver   . Frequent falls   . GERD (gastroesophageal reflux disease)   . Gout   . Heart murmur   . History of blood transfusion   . History of bone marrow transplant ([place])   . History of uterine fibroid   . Hx of cardiac catheterization 06/05/2016   Overview:  Normal coronaries 2017  . Hypertension   . Hypomagnesemia   . Multiple myeloma (  [place])   . Personal history of chemotherapy   . Renal cyst     Past Surgical History:  Procedure Laterality Date  . ABDOMINAL HYSTERECTOMY    . 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 CARDIAC ELECTROPHYSIOLOGY MAPPING AND ABLATION    . CARPAL TUNNEL RELEASE Bilateral   . CHOLECYSTECTOMY  2008  . COLONOSCOPY WITH PROPOFOL N/A 05/07/2017   Procedure: COLONOSCOPY WITH PROPOFOL;  Surgeon: [name], MD;  Location: [facility] ENDOSCOPY;  Service: Gastroenterology;   Laterality: N/A;  . ESOPHAGOGASTRODUODENOSCOPY (EGD) WITH PROPOFOL N/A 05/07/2017   Procedure: ESOPHAGOGASTRODUODENOSCOPY (EGD) WITH PROPOFOL;  Surgeon: [name], MD;  Location: [facility] ENDOSCOPY;  Service: Gastroenterology;  Laterality: N/A;  . FOOT SURGERY Bilateral   . INCONTINENCE SURGERY  2009  . INTERSTIM IMPLANT PLACEMENT    . other     over active bladder  . OTHER SURGICAL HISTORY     bladder stimulator   . PARTIAL HYSTERECTOMY  03/1996   fibroids  . PORTA CATH INSERTION N/A 03/10/2019   Procedure: PORTA CATH INSERTION;  Surgeon: Algernon Huxley, MD;  Location: [facility];  Service: Cardiovascular;  Laterality: N/A;  . TONSILLECTOMY  2007    Family History  Problem Relation Age of Onset  . Colon cancer Father   . Renal Disease Father   . Diabetes Mellitus II Father   . Melanoma Paternal Grandmother   . Breast cancer Maternal Aunt 104  . Anemia Mother   . Heart disease Mother   . Heart failure Mother   . Renal Disease Mother   . Congestive Heart Failure Mother   . Heart disease Maternal Uncle   . Throat cancer Maternal Uncle   . Lung cancer Maternal Uncle   . Liver disease Maternal Uncle   . Heart failure Maternal Uncle   . Hearing loss Son 88       Suicide     Social History:  reports that she quit smoking about 28 years ago. Her smoking use included cigarettes. She has a 20.00 pack-year smoking history. She has never used smokeless tobacco. She reports previous alcohol use. She reports that she does not use drugs. Patient has not had ETOH in several months. She is on disability. She notes exposure to perchloroethylene [facility]).She lives much of her adult life in [place]. She was married with 2 sons. Her husband passed away. Her 2 sons took their own lives (age 54 and 54). She worked in [employer] in [employer]. She went back to school and earned an [employer] and works for [employer] for several years.[place] sistersin [place]. The  patient is alone  today.   Allergies:  Allergies  Allergen Reactions  . Oxycodone-Acetaminophen Anaphylaxis    Swelling and rash  . Celebrex [Celecoxib] Diarrhea  . Codeine   . Benadryl [Diphenhydramine] Palpitations  . Morphine Itching and Rash  . Ondansetron Diarrhea  . Tylenol [Acetaminophen] Itching and Rash    Current Medications: Current Outpatient Medications  Medication Sig Dispense Refill  . acetaminophen (TYLENOL) 500 MG tablet Take 500 mg by mouth every 6 (six) hours as needed.    Marland Kitchen acyclovir (ZOVIRAX) 400 MG tablet Take 1 tablet (400 mg total) by mouth 2 (two) times daily. 60 tablet 11  . allopurinol (ZYLOPRIM) 100 MG tablet TAKE 1 TABLET(100 MG) BY MOUTH DAILY as needed 90 tablet 1  . aspirin 81 MG chewable tablet Chew 1 tablet (81 mg total) by mouth daily. 30 tablet 0  .  dexamethasone (DECADRON) 4 MG tablet Take 5 tablets (20m) on days 2, 9, 16, 23 of cycle 1 and 2 Take 5 tablets (258m on days 2, 16 of cycle 3,4,5 and 6 Take 5 tablets (2018mon days 2 of cycles 7 and beyond 120 tablet 0  . diclofenac sodium (VOLTAREN) 1 % GEL Apply 2 g topically 4 (four) times daily. 100 g 1  . diphenhydrAMINE (BENADRYL) 50 MG capsule Take 50 mg by mouth as needed.    . fentaNYL (DURAGESIC - DOSED MCG/HR) 25 MCG/HR patch Place 25 mcg onto the skin every 3 (three) days.     . fexofenadine (ALLEGRA) 180 MG tablet TAKE 1 TABLET(180 MG) BY MOUTH DAILY 30 tablet 5  . FLUoxetine (PROZAC) 40 MG capsule TAKE 1 CAPSULE EVERY DAY 90 capsule 1  . fluticasone (FLONASE) 50 MCG/ACT nasal spray Place 2 sprays into both nostrils daily. 16 g 2  . furosemide (LASIX) 20 MG tablet Take 20 mg by mouth as needed.     . gMarland Kitchenucose blood (ONE TOUCH ULTRA TEST) test strip     . hydrOXYzine (ATARAX/VISTARIL) 10 MG tablet TAKE 1 TABLET(10 MG) BY MOUTH THREE TIMES DAILY AS NEEDED 90 tablet 0  . lactulose (CHRONULAC) 10 GM/15ML solution     . lidocaine-prilocaine (EMLA) cream APPLY TOPICALLY AS NEEDED. 30 g 0  .  Magnesium Bisglycinate (MAG GLYCINATE) 100 MG TABS Take 100 mg by mouth 2 (two) times daily. 60 tablet 0  . metFORMIN (GLUCOPHAGE-XR) 500 MG 24 hr tablet Take 1 tablet (500 mg total) by mouth daily with breakfast. 90 tablet 1  . Misc Natural Products (OSTEO BI-FLEX ADV TRIPLE ST PO) Take 2 tablets by mouth daily.    . montelukast (SINGULAIR) 10 MG tablet Take 1 tablet by mouth on the day before treatment and for 2 days after treatment. 30 tablet 0  . Multiple Vitamins-Minerals (HAIR/SKIN/NAILS/BIOTIN PO) Take 1 capsule by mouth 3 (three) times daily.    . NON FORMULARY     . Omega 3-6-9 Fatty Acids (OMEGA 3-6-9 COMPLEX) CAPS Take by mouth.    . oMarland Kitcheneprazole (PRILOSEC) 40 MG capsule Take 1 capsule (40 mg total) by mouth daily. 90 capsule 1  . pravastatin (PRAVACHOL) 20 MG tablet TAKE 1 TABLET EVERY DAY 90 tablet 1  . tiZANidine (ZANAFLEX) 4 MG tablet Take 1 tablet (4 mg total) by mouth every 8 (eight) hours as needed for muscle spasms. 270 tablet 1  . traZODone (DESYREL) 100 MG tablet TAKE 1 TABLET AT BEDTIME  FOR  SLEEP 90 tablet 1  . bisoprolol (ZEBETA) 10 MG tablet TAKE 1 TABLET(10 MG) BY MOUTH DAILY (Patient not taking: Reported on 10/21/2019) 90 tablet 0  . diphenoxylate-atropine (LOMOTIL) 2.5-0.025 MG tablet Take 2 tablets by mouth daily as needed for diarrhea or loose stools. (Patient not taking: Reported on 10/21/2019) 45 tablet 2  . lisinopril (PRINIVIL,ZESTRIL) 10 MG tablet Take 10 mg by mouth daily.    . NMarland KitchenRCAN 4 MG/0.1ML LIQD nasal spray kit 1 spray.    . pomalidomide (POMALYST) 3 MG capsule Take 1 capsule (3 mg total) by mouth daily. [employer] Auth #  [id number]   Date Obtained 10/19/2019 (Patient not taking: Reported on 10/21/2019) 21 capsule 0  . promethazine (PHENERGAN) 25 MG tablet Take 1 tablet (25 mg total) by mouth 2 (two) times daily as needed for nausea or vomiting. (Patient not taking: Reported on 10/15/2019) 60 tablet 0  . promethazine-dextromethorphan (PROMETHAZINE-DM) 6.25-15 MG/5ML  syrup Take 5  mLs by mouth 4 (four) times daily as needed for cough. (Patient not taking: Reported on 09/22/2019) 240 mL 0   No current facility-administered medications for this visit.    Review of Systems  Constitutional: Positive for malaise/fatigue (majority of the time). Negative for chills, diaphoresis, fever and weight loss.       Feels "tired".  HENT: Positive for sore throat. Negative for congestion, ear discharge, ear pain, hearing loss, nosebleeds and sinus pain.   Eyes: Negative.  Negative for blurred vision, double vision and photophobia.  Respiratory: Positive for cough (slight). Negative for hemoptysis, sputum production and shortness of breath.   Cardiovascular: Negative.  Negative for chest pain, palpitations, orthopnea and leg swelling.  Gastrointestinal: Positive for constipation and nausea (chronic s/p gallbladder surgery; slightlyl worse). Negative for abdominal pain, blood in stool, diarrhea, heartburn (controlled on medication), melena and vomiting.       Forcing self to eat.  Genitourinary: Negative for frequency, hematuria and urgency.       Bladder leakage and spasms.  Musculoskeletal: Positive for back pain (chronic), joint pain (arthritis) and myalgias (leg cramps). Negative for neck pain.  Skin: Negative.  Negative for itching and rash.       Blisters on right hand, resolved.  Neurological: Positive for sensory change (chronic numbness in fingers and toes- no change). Negative for dizziness, tremors, speech change, focal weakness, weakness and headaches.  Endo/Heme/Allergies: Does not bruise/bleed easily.       Diabetes.  Psychiatric/Behavioral: Negative.  Negative for depression and memory loss. The patient is not nervous/anxious and does not have insomnia.   All other systems reviewed and are negative.  Performance status (ECOG):  1-2  Vitals Blood pressure 127/77, pulse 98, temperature 98.5 F (36.9 C), temperature source Tympanic, resp. rate 18, weight 148  lb 4.2 oz (67.3 kg).   Physical Exam  Constitutional: She is oriented to person, place, and time. She appears well-developed and well-nourished. No distress.  HENT:  Head: Normocephalic and atraumatic.  Mouth/Throat: Oropharynx is clear and moist. No oropharyngeal exudate.  Curly dark blonde. Dentures.  Mouth is dry.  Mask.  Eyes: Pupils are equal, round, and reactive to light. Conjunctivae and EOM are normal. Right eye exhibits no discharge. Left eye exhibits no discharge. No scleral icterus.  Glasses. Brown eyes.  Neck: No JVD present.  Cardiovascular: Normal rate, regular rhythm and normal heart sounds.  No murmur heard. Pulmonary/Chest: Effort normal and breath sounds normal. No respiratory distress. She has no wheezes. She has no rales. She exhibits no tenderness.  Abdominal: Soft. Bowel sounds are normal. She exhibits no distension and no mass. There is abdominal tenderness (slightly tender epigastric region and LLQ). There is no rebound and no guarding.  Musculoskeletal:        General: No tenderness or edema. Normal range of motion.     Cervical back: Normal range of motion and neck supple.  Lymphadenopathy:       Head (right side): No preauricular, no posterior auricular and no occipital adenopathy present.       Head (left side): No preauricular, no posterior auricular and no occipital adenopathy present.    She has no cervical adenopathy.    She has no axillary adenopathy.       Right: No inguinal and no supraclavicular adenopathy present.       Left: No inguinal and no supraclavicular adenopathy present.  Neurological: She is alert and oriented to person, place, and time.  Skin: Skin is warm and  dry. No rash noted. She is not diaphoretic. No erythema. No pallor.  Psychiatric: She has a normal mood and affect. Her behavior is normal. Judgment and thought content normal.  Nursing note and vitals reviewed.   No results displayed because visit has over 200 results.       Assessment:  Lela Murfin is a 65 y.o. 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 Bone marrow revealed 80% plasma cells. Lambda free light chains were 1340. She had nephrotic range proteinuria. She initially underwent induction with RVD. Revlimid maintenance was discontinued on 01/21/2017 secondary to intolerance.   Bone marrowaspirate andbiopsyon 01/18/2021revealed anormocellularmarrow withbut increased lambda-restricted plasma cells (9% aspirate, 40% CD138 immunohistochemistry).Findingswereconsistent with recurrent plasma cell myeloma.Flow cytometry revealed no monoclonal B-cell or phenotypically aberrant T-cell population. Cytogeneticswere 61, XX (normal). 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  M-spikehas been followed: 0 on 04/02/2016 -06/23/2019; and0.2 on 09/02/2017.  Lambda light chainshave been followed: 22.2 (ratio 0.56) on 07/03/2017, 30.8 (ratio 0.78) on 09/02/2017, 36.9 (ratio 0.40) on 10/21/2017, 37.4 (ratio 0.41) on 12/16/2017, 70.7(ratio 0.31) on 02/17/2018, 64.2 (ratio 0.27) on 04/07/2018, 78.9 (ratio 0.18) on 05/26/2018, 128.8 (ratio 0.17) on 08/06/2018, 181.5 (ratio 0.13) on 10/08/2018, 130.9 (ratio 0.13) on 10/20/2018, 160.7 (ratio 0.10)on 12/09/2018, 236.6 (ratio 0.07) on 02/01/2019, 363.6 (ratio 0.04) on 03/22/2019, 404.8 (ratio 0.04) on 04/05/2019, 420.7 (ratio 0.03) on 05/24/2019, 573.4 (ratio 0.03) on 06/23/2019, and451.05(ratio 0.02) on02/19/2021.  24 hour UPEPon 06/03/2019 revealed kappa free light chains95.76,lambda free light chains1,260.71, andratio 0.08.24 hourUPEPon 02/22/2021revealed totalproteinof751m/24 hrs withlambdafree light chains 1,084.121mL andratioof0.10(1.03-31.76). M spike inurinewas46.1%(36123m4 hrs).  Bone surveyon 04/08/2016 and11/28/2018 revealed no definite lytic  lesion seen in the visualized skeleton.Bone surveyon 11/19/2018 revealed no suspicious lucent lesionsand no acute bony abnormality.PET scanon 07/12/2019 revealed no focal metabolic activity to suggest active myeloma within the skeleton. There wereno lytic lesions identified on the CT portion of the examorsoft tissue plasmacytomas. There was no evidence of multiple myeloma.  Pretreatment RBC phenotype on 09/23/2019 was positive for C, e, DUFFY B, KIDD B, M, S, and s antigen; negative for c, E, KELL, DUFFY A, KIDD A, and N antigen.   She is day 26 of cycle #1 daratumumab and hyaluronidase-fihj, Pomalyst, and Decadron (DPd) (began 09/27/2019).   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 Zometa was discontinued in 01/2017. She has chronic nauseaon Phenergan.  She has B12 deficiency. B12 was 254 on 04/09/2017, 295 on 08/20/2018, and 391 on 10/08/2018. Shewasonoral B12.She received B12 monthly (last04/07/2019).Folate was 18.6 on 02/08/2019.  She has iron deficiency. Ferritin was 32 on 07/01/2019. She received Venoferon 07/15/2019 and 07/22/2019.  [facility] for fever and neutropenia.  Cultures were negative.  CXR was negative. She received broad spectrum antibiotics and daily Granix.  She received IVF for acute renal insufficiency due to diarrhea and dehydration.  Creatine was 1.66 on admission and 1.12 on discharge.  She received her second COVID-19vaccineon 10/09/2019.  Symptomatically, she is fatigued.  She no longer has a fever.  Exam is stable.  Hematocrit is 30.9, hemoglobin 10.4, platelets 153,000, and WBC 7000 (ANC 5400).  Magnesium is 1.0.  Plan: 1.   Review [facility] labs from 10/22/2019. 2.   Stage III multiple myeloma She is day 26 of cycle #1 daratumumab + Pomalyst and dexamethasone (DPd).   Counts have recovered.  Discuss plans for next cycle of DPd.  Pomalyst will  be reduced to 3 mg a day.   She would like to start the next cycle of Monday, 10/25/2019.  Discuss symptom management.  She has antiemetics and pain medications at home to use on a prn bases.  Interventions are adequate.    3. Fever and neutropenia  Resolved.  All cultures negative.  Patient to contact office if fevers recur. 4. Hypomagnesemia, chronic Magnesium 1.0 today.  Magnsium sulfate 4 gm IV today. 5.   Constipation  Patient admitted with diarrhea, now with constipation.  She is using Miralax.  Discuss mangement. 6.   Elevated BNP  Patient to see cardiology on 10/26/2019.  Patient has no symptoms of CHF. 7.   Magnesium 4 gm IV today. 8.   RN to call for Pomalyst shipment. 9.   RTC on 10/25/2019 for MD assessment, labs (CBC with diff, CMP, Mg, FLCA) and day1 of cycle #2 DPd.  I discussed the assessment and treatment plan with the patient.  The patient was provided an opportunity to ask questions and all were answered.  The patient agreed with the plan and demonstrated an understanding of the instructions.  The patient was advised to call back if the symptoms worsen or if the condition fails to improve as anticipated.  I provided 15 minutes (1:15 PM - 1:30 PM) of face-to-face time during this this encounter and > 50% was spent counseling as documented under my assessment and plan.  An additional 10 minutes were spent reviewing her chart ([facility] and [place]) including [facility] notes, labs, and imaging studies.   Lequita Asal, MD, PhD    10/22/2019, 1:15 PM  I, Heywood Footman, am acting as a Education administrator for Lequita Asal, MD.  I, Coleta Mike Gip, MD, have reviewed the above documentation for accuracy and completeness, and I agree with the above.

## 2019-10-21 NOTE — Progress Notes (Signed)
The patient c/o she is still tender in her abdomen area ( pain level 3). The patient Name and DOB has been verified by phone today.

## 2019-10-22 ENCOUNTER — Inpatient Hospital Stay: Payer: Medicare PPO

## 2019-10-22 ENCOUNTER — Telehealth: Payer: Self-pay

## 2019-10-22 ENCOUNTER — Encounter: Payer: Self-pay | Admitting: Hematology and Oncology

## 2019-10-22 ENCOUNTER — Inpatient Hospital Stay (HOSPITAL_BASED_OUTPATIENT_CLINIC_OR_DEPARTMENT_OTHER): Payer: Medicare PPO | Admitting: Hematology and Oncology

## 2019-10-22 VITALS — BP 127/77 | HR 98 | Temp 98.5°F | Resp 18

## 2019-10-22 VITALS — BP 127/77 | HR 98 | Temp 98.5°F | Resp 18 | Wt 148.3 lb

## 2019-10-22 DIAGNOSIS — R0600 Dyspnea, unspecified: Secondary | ICD-10-CM | POA: Diagnosis not present

## 2019-10-22 DIAGNOSIS — E538 Deficiency of other specified B group vitamins: Secondary | ICD-10-CM | POA: Diagnosis not present

## 2019-10-22 DIAGNOSIS — R931 Abnormal findings on diagnostic imaging of heart and coronary circulation: Secondary | ICD-10-CM | POA: Diagnosis not present

## 2019-10-22 DIAGNOSIS — K5900 Constipation, unspecified: Secondary | ICD-10-CM | POA: Diagnosis not present

## 2019-10-22 DIAGNOSIS — Z5112 Encounter for antineoplastic immunotherapy: Secondary | ICD-10-CM | POA: Diagnosis not present

## 2019-10-22 DIAGNOSIS — Z7189 Other specified counseling: Secondary | ICD-10-CM

## 2019-10-22 DIAGNOSIS — G8929 Other chronic pain: Secondary | ICD-10-CM | POA: Diagnosis not present

## 2019-10-22 DIAGNOSIS — C9000 Multiple myeloma not having achieved remission: Secondary | ICD-10-CM

## 2019-10-22 DIAGNOSIS — C9002 Multiple myeloma in relapse: Secondary | ICD-10-CM | POA: Diagnosis not present

## 2019-10-22 DIAGNOSIS — N183 Chronic kidney disease, stage 3 unspecified: Secondary | ICD-10-CM | POA: Diagnosis not present

## 2019-10-22 DIAGNOSIS — D709 Neutropenia, unspecified: Secondary | ICD-10-CM | POA: Diagnosis not present

## 2019-10-22 DIAGNOSIS — R197 Diarrhea, unspecified: Secondary | ICD-10-CM | POA: Diagnosis not present

## 2019-10-22 DIAGNOSIS — B373 Candidiasis of vulva and vagina: Secondary | ICD-10-CM | POA: Diagnosis not present

## 2019-10-22 DIAGNOSIS — M549 Dorsalgia, unspecified: Secondary | ICD-10-CM | POA: Diagnosis not present

## 2019-10-22 DIAGNOSIS — I509 Heart failure, unspecified: Secondary | ICD-10-CM | POA: Diagnosis not present

## 2019-10-22 DIAGNOSIS — G629 Polyneuropathy, unspecified: Secondary | ICD-10-CM | POA: Diagnosis not present

## 2019-10-22 DIAGNOSIS — D649 Anemia, unspecified: Secondary | ICD-10-CM | POA: Diagnosis not present

## 2019-10-22 DIAGNOSIS — R5081 Fever presenting with conditions classified elsewhere: Secondary | ICD-10-CM | POA: Diagnosis not present

## 2019-10-22 DIAGNOSIS — I130 Hypertensive heart and chronic kidney disease with heart failure and stage 1 through stage 4 chronic kidney disease, or unspecified chronic kidney disease: Secondary | ICD-10-CM | POA: Diagnosis not present

## 2019-10-22 DIAGNOSIS — R0982 Postnasal drip: Secondary | ICD-10-CM | POA: Diagnosis not present

## 2019-10-22 DIAGNOSIS — Z9484 Stem cells transplant status: Secondary | ICD-10-CM | POA: Diagnosis not present

## 2019-10-22 DIAGNOSIS — M791 Myalgia, unspecified site: Secondary | ICD-10-CM | POA: Diagnosis not present

## 2019-10-22 DIAGNOSIS — M879 Osteonecrosis, unspecified: Secondary | ICD-10-CM | POA: Diagnosis not present

## 2019-10-22 DIAGNOSIS — Z79899 Other long term (current) drug therapy: Secondary | ICD-10-CM | POA: Diagnosis not present

## 2019-10-22 DIAGNOSIS — R61 Generalized hyperhidrosis: Secondary | ICD-10-CM | POA: Diagnosis not present

## 2019-10-22 DIAGNOSIS — R54 Age-related physical debility: Secondary | ICD-10-CM | POA: Diagnosis not present

## 2019-10-22 DIAGNOSIS — R7989 Other specified abnormal findings of blood chemistry: Secondary | ICD-10-CM | POA: Diagnosis not present

## 2019-10-22 DIAGNOSIS — E1122 Type 2 diabetes mellitus with diabetic chronic kidney disease: Secondary | ICD-10-CM | POA: Diagnosis not present

## 2019-10-22 DIAGNOSIS — E8889 Other specified metabolic disorders: Secondary | ICD-10-CM | POA: Diagnosis not present

## 2019-10-22 DIAGNOSIS — R110 Nausea: Secondary | ICD-10-CM | POA: Diagnosis not present

## 2019-10-22 DIAGNOSIS — D801 Nonfamilial hypogammaglobulinemia: Secondary | ICD-10-CM | POA: Diagnosis not present

## 2019-10-22 DIAGNOSIS — R21 Rash and other nonspecific skin eruption: Secondary | ICD-10-CM | POA: Diagnosis not present

## 2019-10-22 MED ORDER — CYANOCOBALAMIN 1000 MCG/ML IJ SOLN: 1000.0000 ug | Freq: Once | INTRAMUSCULAR | Status: DC

## 2019-10-22 MED ORDER — SODIUM CHLORIDE 0.9 % IV SOLN
Freq: Once | INTRAVENOUS | Status: AC
  Filled 2019-10-22: qty 250

## 2019-10-22 MED ORDER — MAGNESIUM SULFATE 4 GM/100ML IV SOLN
4.0000 g | Freq: Once | INTRAVENOUS | Status: AC
  Administered 2019-10-22: 13:00:00 4 g via INTRAVENOUS

## 2019-10-22 MED ORDER — SODIUM CHLORIDE 0.9% FLUSH
10.0000 mL | Freq: Once | INTRAVENOUS | Status: AC | PRN
  Administered 2019-10-22: 10 mL
  Filled 2019-10-22: qty 10

## 2019-10-22 MED ORDER — HEPARIN SOD (PORK) LOCK FLUSH 100 UNIT/ML IV SOLN
500.0000 [IU] | Freq: Once | INTRAVENOUS | Status: AC | PRN
  Administered 2019-10-22: 15:00:00 500 [IU]
  Filled 2019-10-22: qty 5

## 2019-10-22 NOTE — Patient Instructions (Signed)
Magnesium Sulfate injection What is this medicine? MAGNESIUM SULFATE (mag NEE zee um SUL fate) is an electrolyte injection commonly used to treat low magnesium levels in your blood. It is also used to prevent or control seizures in women with preeclampsia or eclampsia. This medicine may be used for other purposes; ask your health care provider or pharmacist if you have questions. What should I tell my health care provider before I take this medicine? They need to know if you have any of these conditions:  heart disease  history of irregular heart beat  kidney disease  an unusual or allergic reaction to magnesium sulfate, medicines, foods, dyes, or preservatives  pregnant or trying to get pregnant  breast-feeding How should I use this medicine? This medicine is for infusion into a vein. It is given by a health care professional in a hospital or clinic setting. Talk to your pediatrician regarding the use of this medicine in children. While this drug may be prescribed for selected conditions, precautions do apply. Overdosage: If you think you have taken too much of this medicine contact a poison control center or emergency room at once. NOTE: This medicine is only for you. Do not share this medicine with others. What if I miss a dose? This does not apply. What may interact with this medicine? This medicine may interact with the following medications:  certain medicines for anxiety or sleep  certain medicines for seizures like phenobarbital  digoxin  medicines that relax muscles for surgery  narcotic medicines for pain This list may not describe all possible interactions. Give your health care provider a list of all the medicines, herbs, non-prescription drugs, or dietary supplements you use. Also tell them if you smoke, drink alcohol, or use illegal drugs. Some items may interact with your medicine. What should I watch for while using this medicine? Your condition will be  monitored carefully while you are receiving this medicine. You may need blood work done while you are receiving this medicine. What side effects may I notice from receiving this medicine? Side effects that you should report to your doctor or health care professional as soon as possible:  allergic reactions like skin rash, itching or hives, swelling of the face, lips, or tongue  facial flushing  muscle weakness  signs and symptoms of low blood pressure like dizziness; feeling faint or lightheaded, falls; unusually weak or tired  signs and symptoms of a dangerous change in heartbeat or heart rhythm like chest pain; dizziness; fast or irregular heartbeat; palpitations; breathing problems  sweating This list may not describe all possible side effects. Call your doctor for medical advice about side effects. You may report side effects to FDA at 1-800-FDA-1088. Where should I keep my medicine? This drug is given in a hospital or clinic and will not be stored at home. NOTE: This sheet is a summary. It may not cover all possible information. If you have questions about this medicine, talk to your doctor, pharmacist, or health care provider.  2020 Elsevier/Gold Standard (2016-01-03 12:31:42)  

## 2019-10-22 NOTE — Telephone Encounter (Signed)
Spoke with [facility] to inform them that the patient is to start Pomalyst 3 mg on Monday. I have informed them if she can get stat shipment. They was able to get everything together and shipped . The patient was informed and understanding

## 2019-10-22 NOTE — Progress Notes (Signed)
PORT accessed at [facility] this morning. Patient had an appointment at [facility]. Labs in care everywhere show serum magnesium level is 1.0. Will give patient IV magnesium this afternoon. Follow up MD visit for hospitalization this past week. Patient states she still feel weak and not back to herself.

## 2019-10-24 ENCOUNTER — Other Ambulatory Visit: Payer: Self-pay

## 2019-10-24 ENCOUNTER — Encounter: Payer: Self-pay | Admitting: Hematology and Oncology

## 2019-10-24 DIAGNOSIS — K5900 Constipation, unspecified: Secondary | ICD-10-CM | POA: Insufficient documentation

## 2019-10-25 ENCOUNTER — Inpatient Hospital Stay: Payer: Medicare PPO

## 2019-10-25 ENCOUNTER — Inpatient Hospital Stay (HOSPITAL_BASED_OUTPATIENT_CLINIC_OR_DEPARTMENT_OTHER): Payer: Medicare PPO | Admitting: Hematology and Oncology

## 2019-10-25 ENCOUNTER — Encounter: Payer: Self-pay | Admitting: Hematology and Oncology

## 2019-10-25 VITALS — BP 101/70 | HR 102 | Resp 18 | Wt 145.6 lb

## 2019-10-25 VITALS — BP 109/68 | HR 82 | Resp 18

## 2019-10-25 DIAGNOSIS — C9000 Multiple myeloma not having achieved remission: Secondary | ICD-10-CM

## 2019-10-25 DIAGNOSIS — R110 Nausea: Secondary | ICD-10-CM

## 2019-10-25 DIAGNOSIS — G620 Drug-induced polyneuropathy: Secondary | ICD-10-CM

## 2019-10-25 DIAGNOSIS — I130 Hypertensive heart and chronic kidney disease with heart failure and stage 1 through stage 4 chronic kidney disease, or unspecified chronic kidney disease: Secondary | ICD-10-CM | POA: Diagnosis not present

## 2019-10-25 DIAGNOSIS — R61 Generalized hyperhidrosis: Secondary | ICD-10-CM | POA: Diagnosis not present

## 2019-10-25 DIAGNOSIS — T451X5A Adverse effect of antineoplastic and immunosuppressive drugs, initial encounter: Secondary | ICD-10-CM | POA: Diagnosis not present

## 2019-10-25 DIAGNOSIS — Z5112 Encounter for antineoplastic immunotherapy: Secondary | ICD-10-CM | POA: Diagnosis not present

## 2019-10-25 DIAGNOSIS — R0982 Postnasal drip: Secondary | ICD-10-CM | POA: Diagnosis not present

## 2019-10-25 DIAGNOSIS — D500 Iron deficiency anemia secondary to blood loss (chronic): Secondary | ICD-10-CM

## 2019-10-25 DIAGNOSIS — R197 Diarrhea, unspecified: Secondary | ICD-10-CM | POA: Diagnosis not present

## 2019-10-25 DIAGNOSIS — M791 Myalgia, unspecified site: Secondary | ICD-10-CM | POA: Diagnosis not present

## 2019-10-25 LAB — CBC WITH DIFFERENTIAL/PLATELET
Abs Immature Granulocytes: 0.1 10*3/uL — ABNORMAL HIGH (ref 0.00–0.07)
Basophils Absolute: 0.1 10*3/uL (ref 0.0–0.1)
Basophils Relative: 2 %
Eosinophils Absolute: 0.1 10*3/uL (ref 0.0–0.5)
Eosinophils Relative: 2 %
HCT: 29.9 % — ABNORMAL LOW (ref 36.0–46.0)
Hemoglobin: 10.1 g/dL — ABNORMAL LOW (ref 12.0–15.0)
Immature Granulocytes: 3 %
Lymphocytes Relative: 21 %
Lymphs Abs: 0.8 10*3/uL (ref 0.7–4.0)
MCH: 30.5 pg (ref 26.0–34.0)
MCHC: 33.8 g/dL (ref 30.0–36.0)
MCV: 90.3 fL (ref 80.0–100.0)
Monocytes Absolute: 0.4 10*3/uL (ref 0.1–1.0)
Monocytes Relative: 9 %
Neutro Abs: 2.5 10*3/uL (ref 1.7–7.7)
Neutrophils Relative %: 63 %
Platelets: 148 10*3/uL — ABNORMAL LOW (ref 150–400)
RBC: 3.31 MIL/uL — ABNORMAL LOW (ref 3.87–5.11)
RDW: 14.6 % (ref 11.5–15.5)
WBC: 3.9 10*3/uL — ABNORMAL LOW (ref 4.0–10.5)
nRBC: 0 % (ref 0.0–0.2)

## 2019-10-25 LAB — COMPREHENSIVE METABOLIC PANEL
ALT: 33 U/L (ref 0–44)
AST: 29 U/L (ref 15–41)
Albumin: 4.3 g/dL (ref 3.5–5.0)
Alkaline Phosphatase: 80 U/L (ref 38–126)
Anion gap: 12 (ref 5–15)
BUN: 16 mg/dL (ref 8–23)
CO2: 22 mmol/L (ref 22–32)
Calcium: 9.2 mg/dL (ref 8.9–10.3)
Chloride: 102 mmol/L (ref 98–111)
Creatinine, Ser: 1.2 mg/dL — ABNORMAL HIGH (ref 0.44–1.00)
GFR calc Af Amer: 56 mL/min — ABNORMAL LOW (ref >60)
GFR calc non Af Amer: 48 mL/min — ABNORMAL LOW (ref >60)
Glucose, Bld: 163 mg/dL — ABNORMAL HIGH (ref 70–99)
Potassium: 4 mmol/L (ref 3.5–5.1)
Sodium: 136 mmol/L (ref 135–145)
Total Bilirubin: 0.4 mg/dL (ref 0.3–1.2)
Total Protein: 7 g/dL (ref 6.5–8.1)

## 2019-10-25 LAB — MAGNESIUM: Magnesium: 1.3 mg/dL — ABNORMAL LOW (ref 1.7–2.4)

## 2019-10-25 MED ORDER — PROMETHAZINE HCL 25 MG PO TABS
25.0000 mg | ORAL_TABLET | Freq: Once | ORAL | Status: DC
  Filled 2019-10-25: qty 1

## 2019-10-25 MED ORDER — PROMETHAZINE HCL 25 MG/ML IJ SOLN
25.0000 mg | Freq: Once | INTRAMUSCULAR | Status: AC
  Administered 2019-10-25: 25 mg via INTRAVENOUS
  Filled 2019-10-25: qty 1

## 2019-10-25 MED ORDER — SODIUM CHLORIDE 0.9% FLUSH
10.0000 mL | INTRAVENOUS | Status: DC | PRN
  Administered 2019-10-25: 10 mL via INTRAVENOUS
  Filled 2019-10-25: qty 10

## 2019-10-25 MED ORDER — DARATUMUMAB-HYALURONIDASE-FIHJ 1800-30000 MG-UT/15ML ~~LOC~~ SOLN
1800.0000 mg | Freq: Once | SUBCUTANEOUS | Status: AC
  Administered 2019-10-25: 1800 mg via SUBCUTANEOUS
  Filled 2019-10-25: qty 15

## 2019-10-25 MED ORDER — SODIUM CHLORIDE 0.9 % IV SOLN
Freq: Once | INTRAVENOUS | Status: AC
  Filled 2019-10-25: qty 250

## 2019-10-25 MED ORDER — HEPARIN SOD (PORK) LOCK FLUSH 100 UNIT/ML IV SOLN
500.0000 [IU] | Freq: Once | INTRAVENOUS | Status: AC
  Administered 2019-10-25: 500 [IU] via INTRAVENOUS
  Filled 2019-10-25: qty 5

## 2019-10-25 MED ORDER — ACETAMINOPHEN 325 MG PO TABS
650.0000 mg | ORAL_TABLET | Freq: Once | ORAL | Status: AC
  Administered 2019-10-25: 650 mg via ORAL
  Filled 2019-10-25: qty 2

## 2019-10-25 MED ORDER — MAGNESIUM SULFATE 4 GM/100ML IV SOLN
4.0000 g | Freq: Once | INTRAVENOUS | Status: AC
  Administered 2019-10-25: 4 g via INTRAVENOUS

## 2019-10-25 MED ORDER — DIPHENHYDRAMINE HCL 25 MG PO CAPS
50.0000 mg | ORAL_CAPSULE | Freq: Once | ORAL | Status: AC
  Administered 2019-10-25: 50 mg via ORAL
  Filled 2019-10-25: qty 2

## 2019-10-25 MED ORDER — DEXAMETHASONE 4 MG PO TABS
20.0000 mg | ORAL_TABLET | Freq: Once | ORAL | Status: AC
  Administered 2019-10-25: 20 mg via ORAL
  Filled 2019-10-25: qty 5

## 2019-10-25 NOTE — Progress Notes (Signed)
Pt reports BP meds on hold per cardiologist.  Pt weight has decreased by 3 lbs since Friday.  States no appetite and supplemental shakes are "too sweet" makes nauseous.

## 2019-10-26 DIAGNOSIS — I428 Other cardiomyopathies: Secondary | ICD-10-CM | POA: Diagnosis not present

## 2019-10-26 DIAGNOSIS — I447 Left bundle-branch block, unspecified: Secondary | ICD-10-CM | POA: Diagnosis not present

## 2019-10-26 DIAGNOSIS — Q231 Congenital insufficiency of aortic valve: Secondary | ICD-10-CM | POA: Diagnosis not present

## 2019-10-26 DIAGNOSIS — R0789 Other chest pain: Secondary | ICD-10-CM | POA: Diagnosis not present

## 2019-10-26 DIAGNOSIS — I10 Essential (primary) hypertension: Secondary | ICD-10-CM | POA: Diagnosis not present

## 2019-10-26 LAB — KAPPA/LAMBDA LIGHT CHAINS
Kappa free light chain: 6.3 mg/L (ref 3.3–19.4)
Kappa, lambda light chain ratio: 0.13 — ABNORMAL LOW (ref 0.26–1.65)
Lambda free light chains: 47.2 mg/L — ABNORMAL HIGH (ref 5.7–26.3)

## 2019-10-26 NOTE — Progress Notes (Signed)
Pharmacist Chemotherapy Monitoring - Follow Up Assessment    I verify that I have reviewed each item in the below checklist:  . Regimen for the patient is scheduled for the appropriate day and plan matches scheduled date. Marland Kitchen Appropriate non-routine labs are ordered dependent on drug ordered. . If applicable, additional medications reviewed and ordered per protocol based on lifetime cumulative doses and/or treatment regimen.   Plan for follow-up and/or issues identified: No . I-vent associated with next due treatment: No . MD and/or nursing notified: No  Sarah [name] K 10/26/2019 2:10 PM

## 2019-10-27 ENCOUNTER — Encounter: Payer: Self-pay | Admitting: Internal Medicine

## 2019-10-27 ENCOUNTER — Ambulatory Visit: Payer: Medicare PPO | Admitting: Internal Medicine

## 2019-10-27 ENCOUNTER — Other Ambulatory Visit: Payer: Self-pay

## 2019-10-27 VITALS — BP 104/62 | HR 90 | Temp 97.1°F | Ht 62.0 in | Wt 145.0 lb

## 2019-10-27 DIAGNOSIS — G620 Drug-induced polyneuropathy: Secondary | ICD-10-CM | POA: Diagnosis not present

## 2019-10-27 DIAGNOSIS — I10 Essential (primary) hypertension: Secondary | ICD-10-CM

## 2019-10-27 DIAGNOSIS — B373 Candidiasis of vulva and vagina: Secondary | ICD-10-CM

## 2019-10-27 DIAGNOSIS — B3731 Acute candidiasis of vulva and vagina: Secondary | ICD-10-CM

## 2019-10-27 DIAGNOSIS — F324 Major depressive disorder, single episode, in partial remission: Secondary | ICD-10-CM

## 2019-10-27 DIAGNOSIS — I495 Sick sinus syndrome: Secondary | ICD-10-CM

## 2019-10-27 MED ORDER — FLUCONAZOLE 100 MG PO TABS: 100.0000 mg | ORAL_TABLET | Freq: Once | ORAL | 0 refills | Status: AC

## 2019-10-27 NOTE — Progress Notes (Signed)
Date:  10/27/2019   Name:  Sarah [name]   DOB:  03-10-1956   MRN:  [id number]   Chief Complaint: Hospitalization Follow-up (Says since she has been home she has been SOB, chest pain on and off. Spoke with cardiologist and they said its not heart related. ), Hypertension (Needs foot exam. ), and Vaginitis (Vaginal itching - has yeast infection from abx. ) Admitted to [facility]  10/15/19 to 10/20/19 for fever and neutropenia.  TOC call on 10/21/19.  Admission Diagnoses: Neutropenic fever Multiple myeloma Acute kidney injury Type 2 diabetes Chronic diastolic congestive heart failure Mild atrial valve stenosis  essential hypertension Discharge Diagnoses:  Principal Problem:   Neutropenic fever ([place]) Active Problems:   Multiple myeloma not having achieved remission (HCC)   Type II diabetes mellitus with complication (HCC)   Chronic diastolic CHF (congestive heart failure), NYHA class 2 (HCC)   Mild aortic valve stenosis   Benign essential HTN   Stage 3 chronic kidney disease   AKI (acute kidney injury) ([place])   Febrile neutropenia ([place])   Hypertension This is a chronic (much lower readings recently so both meds were stopped at discharge) problem. Associated symptoms include chest pain (feels like a bubble in her chest,), palpitations (racing heart rate with exertion) and shortness of breath. Pertinent negatives include no headaches. Hypertensive end-organ damage includes heart failure (hx of mild heart failure).  Diabetes She presents for her follow-up diabetic visit. She has type 2 diabetes mellitus. Pertinent negatives for hypoglycemia include no dizziness or headaches. Associated symptoms include chest pain (feels like a bubble in her chest,), fatigue and weakness. Current diabetic treatment includes oral agent (monotherapy). She is compliant with treatment most of the time. An ACE inhibitor/angiotensin II receptor blocker is contraindicated.  Vaginal Itching The patient's primary  symptoms include genital itching and vaginal discharge (itching from yeast infection). This is a new (started since antibiotics given during admission) problem. The current episode started in the past 7 days. The problem has been gradually improving. The patient is experiencing no pain. Associated symptoms include nausea. Pertinent negatives include no chills, dysuria, fever or headaches.    Lab Results  Component Value Date   CREATININE 1.20 (H) 10/25/2019   BUN 16 10/25/2019   NA 136 10/25/2019   K 4.0 10/25/2019   CL 102 10/25/2019   CO2 22 10/25/2019   Lab Results  Component Value Date   CHOL 131 11/25/2018   HDL 63 11/25/2018   LDLCALC 52 11/25/2018   TRIG 79 11/25/2018   CHOLHDL 2.1 11/25/2018   Lab Results  Component Value Date   TSH 2.105 08/20/2018   Lab Results  Component Value Date   HGBA1C 6.4 (H) 10/16/2019   Lab Results  Component Value Date   WBC 3.9 (L) 10/25/2019   HGB 10.1 (L) 10/25/2019   HCT 29.9 (L) 10/25/2019   MCV 90.3 10/25/2019   PLT 148 (L) 10/25/2019   Lab Results  Component Value Date   ALT 33 10/25/2019   AST 29 10/25/2019   ALKPHOS 80 10/25/2019   BILITOT 0.4 10/25/2019     Review of Systems  Constitutional: Positive for fatigue. Negative for chills, diaphoresis and fever.  HENT: Negative for trouble swallowing.   Respiratory: Positive for cough and shortness of breath. Negative for chest tightness and wheezing.   Cardiovascular: Positive for chest pain (feels like a bubble in her chest,) and palpitations (racing heart rate with exertion). Negative for leg swelling.  Gastrointestinal: Positive for nausea.  Genitourinary: Positive for vaginal discharge (itching from yeast infection). Negative for dysuria.  Neurological: Positive for weakness. Negative for dizziness and headaches.    Patient Active Problem List   Diagnosis Date Noted  . Constipation 10/24/2019  . AKI (acute kidney injury) ([place]) 10/15/2019  . Neutropenic fever  ([place]) 10/15/2019  . Febrile neutropenia ([place]) 10/15/2019  . Encounter for antineoplastic immunotherapy 10/03/2019  . Hypocalcemia 08/28/2019  . Stage 3 chronic kidney disease 05/12/2019  . Chronic nausea 12/17/2018  . Chemotherapy-induced neuropathy ([place]) 12/17/2018  . Hyperlipidemia associated with type 2 diabetes mellitus ([place]) 11/25/2018  . Normocytic anemia 10/21/2018  . Hypomagnesemia 08/20/2018  . Goals of care, counseling/discussion 08/20/2018  . B12 deficiency 08/20/2018  . Thrombocytopenia ([place]) 02/09/2018  . Benign essential HTN 08/21/2017  . Cardiomyopathy, idiopathic ([place]) 08/21/2017  . Carotid artery stenosis, asymptomatic, bilateral 08/06/2017  . Thyroid nodule 08/06/2017  . Cardiac syncope 07/25/2017  . Iron deficiency anemia due to chronic blood loss 05/13/2017  . Spinal stenosis of lumbar region with neurogenic claudication 04/09/2017  . 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 LBBB (left bundle branch block) 10/10/2016  . Long term prescription opiate use 09/07/2016  . Chronic pain syndrome 07/08/2016  . Pain medication agreement signed 07/08/2016  . Spondylosis of lumbar region without myelopathy or radiculopathy 07/08/2016  . Chronic diastolic CHF (congestive heart failure), NYHA class 2 ([place]) 06/05/2016  . LVH (left ventricular hypertrophy) due to hypertensive disease, with heart failure ([place]) 06/05/2016  . Mild aortic valve stenosis 06/05/2016  . 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[place]) 06/05/2016  . Environmental and seasonal allergies 04/19/2016  . Insomnia 04/19/2016  . Bicuspid aortic valve 04/19/2016  . Asthma 04/19/2016  . Aortic regurgitation 04/19/2016  . Type II diabetes mellitus with complication ([place]) 29/47/6546  . Depression, major, single episode, in partial remission ([place]) 04/12/2016  . Irritable bowel syndrome (IBS) 04/12/2016  . Hepatic steatosis 04/12/2016  . Multiple myeloma not having achieved remission ([place]) 04/02/2016    . 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[place]) 07/06/2015  . Stem cell transplant candidate 05/16/2015  . Disequilibrium 01/14/2013    Allergies  Allergen Reactions  . Oxycodone-Acetaminophen Anaphylaxis    Swelling and rash  . Celebrex [Celecoxib] Diarrhea  . Codeine   . Benadryl [Diphenhydramine] Palpitations  . Morphine Itching and Rash  . Ondansetron Diarrhea  . Tylenol [Acetaminophen] Itching and Rash    Past Surgical History:  Procedure Laterality Date  . ABDOMINAL HYSTERECTOMY    . 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 CARDIAC ELECTROPHYSIOLOGY MAPPING AND ABLATION    . CARPAL TUNNEL RELEASE Bilateral   . CHOLECYSTECTOMY  2008  . COLONOSCOPY WITH PROPOFOL N/A 05/07/2017   Procedure: COLONOSCOPY WITH PROPOFOL;  Surgeon: [name], MD;  Location[facility] ENDOSCOPY;  Service: Gastroenterology;  Laterality: N/A;  . ESOPHAGOGASTRODUODENOSCOPY (EGD) WITH PROPOFOL N/A 05/07/2017   Procedure: ESOPHAGOGASTRODUODENOSCOPY (EGD) WITH PROPOFOL;  Surgeon: [name], MD;  Location: [facility] ENDOSCOPY;  Service: Gastroenterology;  Laterality: N/A;  . FOOT SURGERY Bilateral   . INCONTINENCE SURGERY  2009  . INTERSTIM IMPLANT PLACEMENT    . other     over active bladder  . OTHER SURGICAL HISTORY     bladder stimulator   . PARTIAL HYSTERECTOMY  03/1996   fibroids  . PORTA CATH INSERTION N/A 03/10/2019   Procedure: PORTA CATH INSERTION;  Surgeon: Algernon Huxley, MD;  Location: [facility] LAB;  Service: Cardiovascular;  Laterality: N/A;  . TONSILLECTOMY  2007  Social History   Tobacco Use  . Smoking status: Former Smoker    Packs/day: 1.00    Years: 20.00    Pack years: 20.00    Types: Cigarettes    Quit date: 07/02/1991    Years since quitting: 28.3  . Smokeless tobacco: Never Used  Substance Use Topics  . Alcohol use: Not Currently  . Drug use: No     Medication list has been reviewed and updated.  Current Meds  Medication Sig  . acetaminophen (TYLENOL) 500 MG  tablet Take 500 mg by mouth every 6 (six) hours as needed.  Marland Kitchen acyclovir (ZOVIRAX) 400 MG tablet Take 1 tablet (400 mg total) by mouth 2 (two) times daily.  Marland Kitchen allopurinol (ZYLOPRIM) 100 MG tablet TAKE 1 TABLET(100 MG) BY MOUTH DAILY as needed  . aspirin 81 MG chewable tablet Chew 1 tablet (81 mg total) by mouth daily.  Marland Kitchen dexamethasone (DECADRON) 4 MG tablet Take 5 tablets (28m) on days 2, 9, 16, 23 of cycle 1 and 2 Take 5 tablets (278m on days 2, 16 of cycle 3,4,5 and 6 Take 5 tablets (2082mon days 2 of cycles 7 and beyond  . diclofenac sodium (VOLTAREN) 1 % GEL Apply 2 g topically 4 (four) times daily.  . diphenhydrAMINE (BENADRYL) 50 MG capsule Take 50 mg by mouth as needed.  . diphenoxylate-atropine (LOMOTIL) 2.5-0.025 MG tablet Take 2 tablets by mouth daily as needed for diarrhea or loose stools.  . fentaNYL (DURAGESIC - DOSED MCG/HR) 25 MCG/HR patch Place 25 mcg onto the skin every 3 (three) days.   . fexofenadine (ALLEGRA) 180 MG tablet TAKE 1 TABLET(180 MG) BY MOUTH DAILY  . FLUoxetine (PROZAC) 40 MG capsule TAKE 1 CAPSULE EVERY DAY  . fluticasone (FLONASE) 50 MCG/ACT nasal spray Place 2 sprays into both nostrils daily.  . furosemide (LASIX) 20 MG tablet Take 20 mg by mouth as needed.   . gMarland Kitchenucose blood (ONE TOUCH ULTRA TEST) test strip   . hydrOXYzine (ATARAX/VISTARIL) 10 MG tablet TAKE 1 TABLET(10 MG) BY MOUTH THREE TIMES DAILY AS NEEDED  . lactulose (CHRONULAC) 10 GM/15ML solution   . lidocaine-prilocaine (EMLA) cream APPLY TOPICALLY AS NEEDED.  . Magnesium Bisglycinate (MAG GLYCINATE) 100 MG TABS Take 100 mg by mouth 2 (two) times daily.  . metFORMIN (GLUCOPHAGE-XR) 500 MG 24 hr tablet Take 1 tablet (500 mg total) by mouth daily with breakfast.  . Misc Natural Products (OSTEO BI-FLEX ADV TRIPLE ST PO) Take 2 tablets by mouth daily.  . montelukast (SINGULAIR) 10 MG tablet Take 1 tablet by mouth on the day before treatment and for 2 days after treatment.  . Multiple Vitamins-Minerals  (HAIR/SKIN/NAILS/BIOTIN PO) Take 1 capsule by mouth 3 (three) times daily.  . NMarland KitchenRCAN 4 MG/0.1ML LIQD nasal spray kit 1 spray.  . Omega 3-6-9 Fatty Acids (OMEGA 3-6-9 COMPLEX) CAPS Take by mouth.  . oMarland Kitcheneprazole (PRILOSEC) 40 MG capsule Take 1 capsule (40 mg total) by mouth daily.  . pomalidomide (POMALYST) 3 MG capsule Take 1 capsule (3 mg total) by mouth daily. [employer] Auth #  [id number]   Date Obtained 10/19/2019  . pravastatin (PRAVACHOL) 20 MG tablet TAKE 1 TABLET EVERY DAY  . promethazine (PHENERGAN) 25 MG tablet Take 1 tablet (25 mg total) by mouth 2 (two) times daily as needed for nausea or vomiting.  . promethazine-dextromethorphan (PROMETHAZINE-DM) 6.25-15 MG/5ML syrup Take 5 mLs by mouth 4 (four) times daily as needed for cough.  . tMarland KitchenZANidine (ZANAFLEX) 4 MG  tablet Take 1 tablet (4 mg total) by mouth every 8 (eight) hours as needed for muscle spasms.  . traZODone (DESYREL) 100 MG tablet TAKE 1 TABLET AT BEDTIME  FOR  SLEEP    PHQ 2/9 Scores 10/27/2019 01/04/2019 11/25/2018 10/28/2018  PHQ - 2 Score 2 2 6 4   PHQ- 9 Score 5 10 18 11     BP Readings from Last 3 Encounters:  10/27/19 104/62  10/25/19 109/68  10/25/19 101/70    Physical Exam Vitals and nursing note reviewed.  Constitutional:      General: She is not in acute distress.    Appearance: Normal appearance. She is well-developed.  HENT:     Head: Normocephalic and atraumatic.  Cardiovascular:     Rate and Rhythm: Normal rate and regular rhythm.  Pulmonary:     Effort: Pulmonary effort is normal. No respiratory distress.     Breath sounds: No wheezing or rhonchi.  Musculoskeletal:     Cervical back: Normal range of motion.     Right lower leg: No edema.     Left lower leg: No edema.  Skin:    General: Skin is warm and dry.     Findings: Rash (on both hands - may be side effect of chemotherapy) present.  Neurological:     General: No focal deficit present.     Mental Status: She is alert and oriented to person,  place, and time.  Psychiatric:        Behavior: Behavior normal.        Thought Content: Thought content normal.     Wt Readings from Last 3 Encounters:  10/27/19 145 lb (65.8 kg)  10/25/19 145 lb 9.8 oz (66 kg)  10/22/19 148 lb 4.2 oz (67.3 kg)    BP 104/62   Pulse 90   Temp (!) 97.1 F (36.2 C) (Temporal)   Ht 5' 2"  (1.575 m)   Wt 145 lb (65.8 kg)   SpO2 98%   BMI 26.52 kg/m   Assessment and Plan: 1. Benign essential HTN BP continues to run low Remain off of meds; monitor for increase and need to resume medication  2. Chemotherapy-induced neuropathy (HCC) Stable, unchanged  3. Depression, major, single episode, in partial remission (HCC) Clinically stable on current regimen with good control of symptoms, No SI or HI. Will continue current therapy with Prozac  4. 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HCC) Episodes of tachycardia - partly related to deconditioning and recent hospital stay Follow up with cardiology if needed  5. Vaginal yeast infection Incomplete response to single dose - will repeat - fluconazole (DIFLUCAN) 100 MG tablet; Take 1 tablet (100 mg total) by mouth once for 1 dose.  Dispense: 1 tablet; Refill: 0   Partially dictated using Editor, commissioning. Any errors are unintentional.  Halina Maidens, MD [facility]  10/27/2019

## 2019-10-28 ENCOUNTER — Inpatient Hospital Stay: Payer: Medicare PPO

## 2019-10-28 DIAGNOSIS — I130 Hypertensive heart and chronic kidney disease with heart failure and stage 1 through stage 4 chronic kidney disease, or unspecified chronic kidney disease: Secondary | ICD-10-CM | POA: Diagnosis not present

## 2019-10-28 DIAGNOSIS — C9000 Multiple myeloma not having achieved remission: Secondary | ICD-10-CM | POA: Diagnosis not present

## 2019-10-28 DIAGNOSIS — R61 Generalized hyperhidrosis: Secondary | ICD-10-CM | POA: Diagnosis not present

## 2019-10-28 DIAGNOSIS — R197 Diarrhea, unspecified: Secondary | ICD-10-CM | POA: Diagnosis not present

## 2019-10-28 DIAGNOSIS — R110 Nausea: Secondary | ICD-10-CM | POA: Diagnosis not present

## 2019-10-28 DIAGNOSIS — Z5112 Encounter for antineoplastic immunotherapy: Secondary | ICD-10-CM | POA: Diagnosis not present

## 2019-10-28 DIAGNOSIS — M791 Myalgia, unspecified site: Secondary | ICD-10-CM | POA: Diagnosis not present

## 2019-10-28 DIAGNOSIS — R0982 Postnasal drip: Secondary | ICD-10-CM | POA: Diagnosis not present

## 2019-10-28 LAB — MAGNESIUM: Magnesium: 1.2 mg/dL — ABNORMAL LOW (ref 1.7–2.4)

## 2019-10-28 MED ORDER — HEPARIN SOD (PORK) LOCK FLUSH 100 UNIT/ML IV SOLN
500.0000 [IU] | Freq: Once | INTRAVENOUS | Status: AC
  Administered 2019-10-28: 500 [IU] via INTRAVENOUS
  Filled 2019-10-28: qty 5

## 2019-10-28 MED ORDER — SODIUM CHLORIDE 0.9 % IV SOLN
Freq: Once | INTRAVENOUS | Status: AC
  Filled 2019-10-28: qty 250

## 2019-10-28 MED ORDER — MAGNESIUM SULFATE 4 GM/100ML IV SOLN
4.0000 g | Freq: Once | INTRAVENOUS | Status: AC
  Administered 2019-10-28: 4 g via INTRAVENOUS

## 2019-10-28 MED ORDER — CYANOCOBALAMIN 1000 MCG/ML IJ SOLN
1000.0000 ug | Freq: Once | INTRAMUSCULAR | Status: AC
  Administered 2019-10-28: 1000 ug via INTRAMUSCULAR

## 2019-10-28 MED ORDER — SODIUM CHLORIDE 0.9% FLUSH
10.0000 mL | INTRAVENOUS | Status: DC | PRN
  Administered 2019-10-28: 10 mL via INTRAVENOUS
  Filled 2019-10-28: qty 10

## 2019-10-28 NOTE — Patient Instructions (Signed)
Magnesium Sulfate injection What is this medicine? MAGNESIUM SULFATE (mag NEE zee um SUL fate) is an electrolyte injection commonly used to treat low magnesium levels in your blood. It is also used to prevent or control seizures in women with preeclampsia or eclampsia. This medicine may be used for other purposes; ask your health care provider or pharmacist if you have questions. What should I tell my health care provider before I take this medicine? They need to know if you have any of these conditions:  heart disease  history of irregular heart beat  kidney disease  an unusual or allergic reaction to magnesium sulfate, medicines, foods, dyes, or preservatives  pregnant or trying to get pregnant  breast-feeding How should I use this medicine? This medicine is for infusion into a vein. It is given by a health care professional in a hospital or clinic setting. Talk to your pediatrician regarding the use of this medicine in children. While this drug may be prescribed for selected conditions, precautions do apply. Overdosage: If you think you have taken too much of this medicine contact a poison control center or emergency room at once. NOTE: This medicine is only for you. Do not share this medicine with others. What if I miss a dose? This does not apply. What may interact with this medicine? This medicine may interact with the following medications:  certain medicines for anxiety or sleep  certain medicines for seizures like phenobarbital  digoxin  medicines that relax muscles for surgery  narcotic medicines for pain This list may not describe all possible interactions. Give your health care provider a list of all the medicines, herbs, non-prescription drugs, or dietary supplements you use. Also tell them if you smoke, drink alcohol, or use illegal drugs. Some items may interact with your medicine. What should I watch for while using this medicine? Your condition will be  monitored carefully while you are receiving this medicine. You may need blood work done while you are receiving this medicine. What side effects may I notice from receiving this medicine? Side effects that you should report to your doctor or health care professional as soon as possible:  allergic reactions like skin rash, itching or hives, swelling of the face, lips, or tongue  facial flushing  muscle weakness  signs and symptoms of low blood pressure like dizziness; feeling faint or lightheaded, falls; unusually weak or tired  signs and symptoms of a dangerous change in heartbeat or heart rhythm like chest pain; dizziness; fast or irregular heartbeat; palpitations; breathing problems  sweating This list may not describe all possible side effects. Call your doctor for medical advice about side effects. You may report side effects to FDA at 1-800-FDA-1088. Where should I keep my medicine? This drug is given in a hospital or clinic and will not be stored at home. NOTE: This sheet is a summary. It may not cover all possible information. If you have questions about this medicine, talk to your doctor, pharmacist, or health care provider.  2020 Elsevier/Gold Standard (2016-01-03 12:31:42)  

## 2019-10-29 ENCOUNTER — Encounter: Payer: Self-pay | Admitting: Hematology and Oncology

## 2019-10-29 ENCOUNTER — Other Ambulatory Visit: Payer: Self-pay

## 2019-10-29 NOTE — Progress Notes (Signed)
No new changes noted today. The patient name and DOB has been verified by phone today. 

## 2019-10-31 NOTE — Progress Notes (Signed)
[facility]  [address] [place], [place] [address] Phone: [phone]  Fax: [phone]   Clinic Day:  11/01/2019   Referring physician: Glean Hess, MD  Chief Complaint: Sarah [name] is a 64 y.o. 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   HPI: The patient was last seen in the medical oncology clinic on 10/25/2019. At that time, she had some nausea.  Neuropathy was stable.  Exam was stable.  Hematocrit was 29.9, hemoglobin 10.1, MCV 90.3, platelets 148,000, WBC 3900 with an ANC of 2500.  Magnesium was 1.3.  She received day 1 of cycle #2 daratumumab SQ.  She received 4 g of IV magnesium and 25 mg IV Phenergan.  She returned on 10/28/2019.  Magnesium was 1.2.  She received an additional 4 g of IV magnesium.  During the interim, she is feeling much better. She is regaining her strength back.   She notes that the IV Phenergan added to her regimen made her treatment go so much smoother and she would like it added in as a regular part of her treatment. She feels so much better than she usually does after treatment with that added.  She started cycle #2 Pomalyst on 10/26/2019.  She took her Tylenol and Benadryl premeds today.   Past Medical History:  Diagnosis Date  . Abnormal stress test 02/14/2016   Overview:  Added automatically from request for surgery [id number]  . Anemia   . Anxiety   . Arthritis   . Bicuspid aortic valve   . CHF (congestive heart failure) ([place])   . CKD (chronic kidney disease) stage 3, GFR 30-59 ml/min   . Depression   . Diabetes mellitus ([place])   . Dizziness   . Fatty liver   . Frequent falls   . GERD (gastroesophageal reflux disease)   . Gout   . Heart murmur   . History of blood transfusion   . History of bone marrow transplant ([place])   . History of uterine  fibroid   . Hx of cardiac catheterization 06/05/2016   Overview:  Normal coronaries 2017  . Hypertension   . Hypomagnesemia   . Multiple myeloma ([place])   . Personal history of chemotherapy   . Renal cyst     Past Surgical History:  Procedure Laterality Date  . ABDOMINAL HYSTERECTOMY    . 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 CARDIAC ELECTROPHYSIOLOGY MAPPING AND ABLATION    . CARPAL TUNNEL RELEASE Bilateral   . CHOLECYSTECTOMY  2008  . COLONOSCOPY WITH PROPOFOL N/A 05/07/2017   Procedure: COLONOSCOPY WITH PROPOFOL;  Surgeon: [name], MD;  Location: [facility] ENDOSCOPY;  Service: Gastroenterology;  Laterality: N/A;  . ESOPHAGOGASTRODUODENOSCOPY (EGD) WITH PROPOFOL N/A 05/07/2017   Procedure: ESOPHAGOGASTRODUODENOSCOPY (EGD) WITH PROPOFOL;  Surgeon: [name], MD;  Location: [facility] ENDOSCOPY;  Service: Gastroenterology;  Laterality: N/A;  . FOOT SURGERY Bilateral   . INCONTINENCE SURGERY  2009  . INTERSTIM IMPLANT PLACEMENT    . other     over active bladder  . OTHER SURGICAL HISTORY     bladder stimulator   . PARTIAL HYSTERECTOMY  03/1996   fibroids  . PORTA CATH INSERTION N/A 03/10/2019   Procedure: PORTA CATH INSERTION;  Surgeon: Algernon Huxley, MD;  Location: [place] CV LAB;  Service: Cardiovascular;  Laterality: N/A;  . TONSILLECTOMY  2007    Family History  Problem Relation Age of Onset  . Colon cancer Father   . Renal Disease Father   . Diabetes Mellitus II Father   . Melanoma Paternal Grandmother   . Breast cancer Maternal Aunt 82  . Anemia Mother   . Heart disease Mother   . Heart failure Mother   . Renal Disease Mother   . Congestive Heart Failure Mother   . Heart disease Maternal Uncle   . Throat cancer Maternal Uncle   . Lung cancer Maternal Uncle   . Liver disease Maternal Uncle   . Heart failure Maternal Uncle   . Hearing loss Son 58       Suicide     Social History:  reports that she quit smoking about 28 years ago. Her smoking use included  cigarettes. She has a 20.00 pack-year smoking history. She has never used smokeless tobacco. She reports previous alcohol use. She reports that she does not use drugs. Patient has not had ETOH in several months. She is on disability. She notes exposure to perchloroethylene [facility]).She lives much of her adult life in [place]. She was married with 2 sons. Her husband passed away. Her 2 sons took their own lives (age 74 and 66). She worked in [employer] in [employer]. She went back to school and earned an [employer] and works for [employer] for several years.[place]. The patient is alone today.   Allergies:  Allergies  Allergen Reactions  . Oxycodone-Acetaminophen Anaphylaxis    Swelling and rash  . Celebrex [Celecoxib] Diarrhea  . Codeine   . Benadryl [Diphenhydramine] Palpitations  . Morphine Itching and Rash  . Ondansetron Diarrhea  . Tylenol [Acetaminophen] Itching and Rash    Current Medications: Current Outpatient Medications  Medication Sig Dispense Refill  . acetaminophen (TYLENOL) 500 MG tablet Take 500 mg by mouth every 6 (six) hours as needed.    Marland Kitchen acyclovir (ZOVIRAX) 400 MG tablet Take 1 tablet (400 mg total) by mouth 2 (two) times daily. 60 tablet 11  . allopurinol (ZYLOPRIM) 100 MG tablet TAKE 1 TABLET(100 MG) BY MOUTH DAILY as needed 90 tablet 1  . aspirin 81 MG chewable tablet Chew 1 tablet (81 mg total) by mouth daily. 30 tablet 0  . diclofenac sodium (VOLTAREN) 1 % GEL Apply 2 g topically 4 (four) times daily. 100 g 1  . diphenhydrAMINE (BENADRYL) 50 MG capsule Take 50 mg by mouth as needed.    . diphenoxylate-atropine (LOMOTIL) 2.5-0.025 MG tablet Take 2 tablets by mouth daily as needed for diarrhea or loose stools. 45 tablet 2  . fentaNYL (DURAGESIC - DOSED MCG/HR) 25 MCG/HR patch Place 25 mcg onto the skin every 3 (three) days.     . fexofenadine (ALLEGRA) 180 MG tablet TAKE 1 TABLET(180 MG) BY MOUTH DAILY 30 tablet 5    . FLUoxetine (PROZAC) 40 MG capsule TAKE 1 CAPSULE EVERY DAY 90 capsule 1  . fluticasone (FLONASE) 50 MCG/ACT nasal spray Place 2 sprays into both nostrils daily. 16 g 2  . furosemide (LASIX) 20 MG tablet Take 20 mg by mouth as needed.     Marland Kitchen glucose blood (ONE TOUCH ULTRA TEST) test strip     . hydrOXYzine (ATARAX/VISTARIL) 10 MG tablet TAKE 1 TABLET(10 MG) BY MOUTH THREE TIMES DAILY AS NEEDED 90 tablet 0  . lactulose (CHRONULAC) 10 GM/15ML solution     . lidocaine-prilocaine (EMLA) cream APPLY TOPICALLY AS NEEDED. 30 g 0  .  Magnesium Bisglycinate (MAG GLYCINATE) 100 MG TABS Take 100 mg by mouth 2 (two) times daily. 60 tablet 0  . metFORMIN (GLUCOPHAGE-XR) 500 MG 24 hr tablet Take 1 tablet (500 mg total) by mouth daily with breakfast. 90 tablet 1  . Misc Natural Products (OSTEO BI-FLEX ADV TRIPLE ST PO) Take 2 tablets by mouth daily.    . montelukast (SINGULAIR) 10 MG tablet Take 1 tablet by mouth on the day before treatment and for 2 days after treatment. 30 tablet 0  . Multiple Vitamins-Minerals (HAIR/SKIN/NAILS/BIOTIN PO) Take 1 capsule by mouth 3 (three) times daily.    . Omega 3-6-9 Fatty Acids (OMEGA 3-6-9 COMPLEX) CAPS Take by mouth.    Marland Kitchen omeprazole (PRILOSEC) 40 MG capsule Take 1 capsule (40 mg total) by mouth daily. 90 capsule 1  . pomalidomide (POMALYST) 3 MG capsule Take 1 capsule (3 mg total) by mouth daily. [employer] Auth #  [id number]      Date Obtained 10/19/2019 21 capsule 0  . pravastatin (PRAVACHOL) 20 MG tablet TAKE 1 TABLET EVERY DAY 90 tablet 1  . tiZANidine (ZANAFLEX) 4 MG tablet Take 1 tablet (4 mg total) by mouth every 8 (eight) hours as needed for muscle spasms. 270 tablet 1  . traZODone (DESYREL) 100 MG tablet TAKE 1 TABLET AT BEDTIME  FOR  SLEEP 90 tablet 1  . dexamethasone (DECADRON) 4 MG tablet Take 5 tablets (83m) on days 2, 9, 16, 23 of cycle 1 and 2 Take 5 tablets (283m on days 2, 16 of cycle 3,4,5 and 6 Take 5 tablets (2072mon days 2 of cycles 7 and beyond  (Patient not taking: Reported on 11/01/2019) 120 tablet 0  . fluconazole (DIFLUCAN) 100 MG tablet     . NARCAN 4 MG/0.1ML LIQD nasal spray kit 1 spray.    . promethazine (PHENERGAN) 25 MG tablet Take 1 tablet (25 mg total) by mouth 2 (two) times daily as needed for nausea or vomiting. (Patient not taking: Reported on 11/01/2019) 60 tablet 0  . promethazine-dextromethorphan (PROMETHAZINE-DM) 6.25-15 MG/5ML syrup Take 5 mLs by mouth 4 (four) times daily as needed for cough. (Patient not taking: Reported on 10/29/2019) 240 mL 0   No current facility-administered medications for this visit.   Facility-Administered Medications Ordered in Other Visits  Medication Dose Route Frequency Provider Last Rate Last Admin  . heparin lock flush 100 unit/mL  500 Units Intravenous Once Angelie Kram C, MD      . sodium chloride flush (NS) 0.9 % injection 10 mL  10 mL Intravenous PRN CorLequita AsalD   10 mL at 11/01/19 0855    Review of Systems  Constitutional: Positive for malaise/fatigue. Negative for chills, diaphoresis, fever and weight loss (up 3 lbs).       Feels "much better; stronger".  HENT: Negative.  Negative for congestion, ear discharge, ear pain, hearing loss, nosebleeds, sinus pain, sore throat and tinnitus.   Eyes: Negative.  Negative for blurred vision, double vision and photophobia.  Respiratory: Negative.  Negative for cough, hemoptysis, sputum production and shortness of breath.   Cardiovascular: Negative.  Negative for chest pain, palpitations, orthopnea and leg swelling.  Gastrointestinal: Positive for constipation (using Miralax) and nausea (chronic s/p gallbladder surgery; improved with IV Phenergan). Negative for abdominal pain, blood in stool, diarrhea, heartburn (controlled), melena and vomiting.       Eating better.  Appetite better.  Genitourinary: Negative for frequency, hematuria and urgency.       Bladder leakage and spasms.  Musculoskeletal: Positive for back pain  (chronic), joint pain (arthritis) and myalgias (leg cramps). Negative for neck pain.  Skin: Negative.  Negative for itching and rash.  Neurological: Positive for sensory change (chronic numbness in fingers and toes- no change). Negative for dizziness, tremors, speech change, focal weakness, weakness and headaches.  Endo/Heme/Allergies: Does not bruise/bleed easily.       Diabetes.  Psychiatric/Behavioral: Negative.  Negative for depression and memory loss. The patient is not nervous/anxious and does not have insomnia.   All other systems reviewed and are negative.  Performance status (ECOG): 2   Vitals Blood pressure 120/80, pulse (!) 101, temperature 98.2 F (36.8 C), temperature source Tympanic, resp. rate 16, height 5' 2" (1.575 m), weight 148 lb 9.4 oz (67.4 kg), SpO2 98 %.   Physical Exam  Constitutional: She is oriented to person, place, and time. She appears well-developed and well-nourished. No distress.  HENT:  Head: Normocephalic and atraumatic.  Mouth/Throat: Oropharynx is clear and moist. No oropharyngeal exudate.  Curly dark blonde. Dentures.  Mouth dry.  Mask.  Eyes: Pupils are equal, round, and reactive to light. Conjunctivae and EOM are normal. Right eye exhibits no discharge. Left eye exhibits no discharge. No scleral icterus.  Glasses. Brown eyes.  Neck: No JVD present.  Cardiovascular: Normal rate, regular rhythm and normal heart sounds.  No murmur heard. Pulmonary/Chest: Effort normal and breath sounds normal. No respiratory distress. She has no wheezes. She has no rales. She exhibits no tenderness.  Abdominal: Soft. Bowel sounds are normal. She exhibits no distension and no mass. There is no abdominal tenderness. There is no rebound and no guarding.  Musculoskeletal:        General: No tenderness or edema. Normal range of motion.     Cervical back: Normal range of motion and neck supple.  Lymphadenopathy:       Head (right side): No preauricular, no posterior  auricular and no occipital adenopathy present.       Head (left side): No preauricular, no posterior auricular and no occipital adenopathy present.    She has no cervical adenopathy.    She has no axillary adenopathy.       Right: No inguinal and no supraclavicular adenopathy present.       Left: No inguinal and no supraclavicular adenopathy present.  Neurological: She is alert and oriented to person, place, and time.  Skin: Skin is warm and dry. No rash noted. She is not diaphoretic. No erythema. No pallor.  Psychiatric: She has a normal mood and affect. Her behavior is normal. Judgment and thought content normal.  Nursing note and vitals reviewed.   Infusion on 11/01/2019  Component Date Value Ref Range Status  . Sodium 11/01/2019 138  135 - 145 mmol/L Final  . Potassium 11/01/2019 4.3  3.5 - 5.1 mmol/L Final  . Chloride 11/01/2019 104  98 - 111 mmol/L Final  . CO2 11/01/2019 24  22 - 32 mmol/L Final  . Glucose, Bld 11/01/2019 180* 70 - 99 mg/dL Final   Glucose reference range applies only to samples taken after fasting for at least 8 hours.  . BUN 11/01/2019 16  8 - 23 mg/dL Final  . Creatinine, Ser 11/01/2019 0.93  0.44 - 1.00 mg/dL Final  . Calcium 11/01/2019 8.9  8.9 - 10.3 mg/dL Final  . Total Protein 11/01/2019 6.6  6.5 - 8.1 g/dL Final  . Albumin 11/01/2019 4.1  3.5 - 5.0 g/dL Final  . AST 11/01/2019 23  15 -  41 U/L Final  . ALT 11/01/2019 29  0 - 44 U/L Final  . Alkaline Phosphatase 11/01/2019 74  38 - 126 U/L Final  . Total Bilirubin 11/01/2019 0.7  0.3 - 1.2 mg/dL Final  . GFR calc non Af Amer 11/01/2019 >60  >60 mL/min Final  . GFR calc Af Amer 11/01/2019 >60  >60 mL/min Final  . Anion gap 11/01/2019 10  5 - 15 Final   Performed at [facility], [address], [place], [place] [address]  . WBC 11/01/2019 4.2  4.0 - 10.5 K/uL Final  . RBC 11/01/2019 3.20* 3.87 - 5.11 MIL/uL Final  . Hemoglobin 11/01/2019 10.1* 12.0 - 15.0 g/dL Final  . HCT 11/01/2019  29.5* 36.0 - 46.0 % Final  . MCV 11/01/2019 92.2  80.0 - 100.0 fL Final  . MCH 11/01/2019 31.6  26.0 - 34.0 pg Final  . MCHC 11/01/2019 34.2  30.0 - 36.0 g/dL Final  . RDW 11/01/2019 16.3* 11.5 - 15.5 % Final  . Platelets 11/01/2019 148* 150 - 400 K/uL Final  . nRBC 11/01/2019 0.0  0.0 - 0.2 % Final  . Neutrophils Relative % 11/01/2019 82  % Final  . Neutro Abs 11/01/2019 3.5  1.7 - 7.7 K/uL Final  . Lymphocytes Relative 11/01/2019 7  % Final  . Lymphs Abs 11/01/2019 0.3* 0.7 - 4.0 K/uL Final  . Monocytes Relative 11/01/2019 4  % Final  . Monocytes Absolute 11/01/2019 0.2  0.1 - 1.0 K/uL Final  . Eosinophils Relative 11/01/2019 4  % Final  . Eosinophils Absolute 11/01/2019 0.2  0.0 - 0.5 K/uL Final  . Basophils Relative 11/01/2019 2  % Final  . Basophils Absolute 11/01/2019 0.1  0.0 - 0.1 K/uL Final  . Immature Granulocytes 11/01/2019 1  % Final  . Abs Immature Granulocytes 11/01/2019 0.02  0.00 - 0.07 K/uL Final   Performed at [facility], [address] [place], [place] [address]  . Magnesium 11/01/2019 1.1* 1.7 - 2.4 mg/dL Final   Performed at [facility], [address] [place], [place] [address]    Assessment:  Sarah [name] is a 64 y.o. 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 Bone marrow revealed 80% plasma cells. Lambda free light chains were 1340. She had nephrotic range proteinuria. She initially underwent induction with RVD. Revlimid maintenance was discontinued on 01/21/2017 secondary to intolerance.   Bone marrowaspirate andbiopsyon 01/18/2021revealed anormocellularmarrow withbut increased lambda-restricted plasma cells (9% aspirate, 40% CD138 immunohistochemistry).Findingswereconsistent with recurrent plasma cell myeloma.Flow cytometry revealed no monoclonal B-cell or phenotypically aberrant T-cell population.  Cytogeneticswere 60, XX (normal). 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  M-spikehas been followed: 0 on 04/02/2016 -06/23/2019; and0.2 on 09/02/2017.  Lambda light chainshave been followed: 22.2 (ratio 0.56) on 07/03/2017, 30.8 (ratio 0.78) on 09/02/2017, 36.9 (ratio 0.40) on 10/21/2017, 37.4 (ratio 0.41) on 12/16/2017, 70.7(ratio 0.31) on 02/17/2018, 64.2 (ratio 0.27) on 04/07/2018, 78.9 (ratio 0.18) on 05/26/2018, 128.8 (ratio 0.17) on 08/06/2018, 181.5 (ratio 0.13) on 10/08/2018, 130.9 (ratio 0.13) on 10/20/2018, 160.7 (ratio 0.10)on 12/09/2018, 236.6 (ratio 0.07) on 02/01/2019, 363.6 (ratio 0.04) on 03/22/2019, 404.8 (ratio 0.04) on 04/05/2019, 420.7 (ratio 0.03) on 05/24/2019, 573.4 (ratio 0.03) on 06/23/2019, 451.05(ratio 0.02) on02/19/2021, and 47.2 (ratio 0.13) on 10/25/2019.  24 hour UPEPon 06/03/2019 revealed kappa free light chains95.76,lambda free light chains1,260.71, andratio 0.08.24 hourUPEPon 02/22/2021revealed totalproteinof778m/24 hrs withlambdafree light chains 1,084.120mL  andratioof0.10(1.03-31.76). M spike inurinewas46.1%(330m/24 hrs).  Bone surveyon 04/08/2016 and11/28/2018 revealed no definite lytic lesion seen in the visualized skeleton.Bone surveyon 11/19/2018 revealed no suspicious lucent lesionsand no acute bony abnormality.PET scanon 07/12/2019 revealed no focal metabolic activity to suggest active myeloma within the skeleton. There wereno lytic lesions identified on the CT portion of the examorsoft tissue plasmacytomas. There was no evidence of multiple myeloma.  Pretreatment RBC phenotype on 09/23/2019 was positive for C, e, DUFFY B, KIDD B, M, S, and s antigen; negative for c, E, KELL, DUFFY A, KIDD A, and N antigen.   She is day 8 of cycle #2 daratumumab and hyaluronidase-fihj, Pomalyst, and Decadron (DPd) (09/27/2019 - 10/25/2019).   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 Zometa was discontinued in 01/2017. She has chronic nauseaon Phenergan.  She has B12 deficiency. B12 was 254 on 04/09/2017, 295 on 08/20/2018, and 391 on 10/08/2018. Shewasonoral B12.She received B12 monthly (last04/29/2021).Folate was 18.6 on 02/08/2019.  She has iron deficiency. Ferritin was 32 on 07/01/2019. She received Venoferon 07/15/2019 and 07/22/2019.  [facility] fever and neutropenia.  Cultures were negative.  CXR was negative. She received broad spectrum antibiotics and daily Granix.  She received IVF for acute renal insufficiency due to diarrhea and dehydration.  Creatine was 1.66 on admission and 1.12 on discharge.  She received her second COVID-19vaccineon 10/09/2019.  Symptomatically, she is feeling better today.  Nausea is better controlled with Phenergan IV.  Exam is stable.  Plan: 1.   Labs today: CBC with diff, CMP, Mg. 2.   Stage III multiple myeloma Clinically, she is doing better today.             Lambda free light chains have improved from 451.05on02/19/2021 to 47.2 after cycle #1.             She is day 8 of cycle #2 daratumumab plus Pomalyst and dexamethasone (DPd).              She began cycle #2 Pomlayst on 10/26/2019.                           She is tolerating treatment without progressive neuropathy..             Labs reviewed.  Counts are adequate.  Day 8 of cycle #2 daratumumab SQ today.                         Patient took Benadryl and Tylenol at home.   She takes Singulair beginning day prior to each dose of daratumumab.             Continue Pomalyst.  Discuss no extension of Pomalyst even though started a day late (20 days only this cycle).   3. Normocytic anemia Hematocrit29.5. Hemoglobin10.1. MCV92.2.Platelets148,000today. Ferritin128 on 10/04/2019. TSH was normal on02/20/2020   Creatinine 1.20(CrCl42.875mminute). Etiology secondary to myeloma and treatment. Continue to monitor, 4. Grade II peripheral neuropathy Etiologysecondary toprior chemotherapy. Neuropathy remains stable.  Continue to monitor. 5.B12 deficiency Continue B12 monthly (last on 10/28/2019). Folate was18.6on 02/08/2019. Check folate annually. 6.   Hypomagnesemia, chronic Magnesium 1.1 today.             Magnesium 4 gm IV today.             Continue IV magnesium twice a week.  7.   Rash  Rash between fingers has resolved.               Etiology unclear.  Continue to monitor. 8.   Nausea             Patient has chronic nausea relieved only with Phenergan.             Patient received Phenergan 25 mg IV last week with dramatic improvement in nausea.              Continue weekly Phenergan IV with treatment.  Patient declined a granisetron patch secondary to interaction with Trazodone. 9.   Day 8 of cycle #2 daratumumab SQ. 10.   Magnesium 4 gm IV today. 11.   RTC on Thursday for labs (Mg) and +/- IV Mg. 12.   RTC in 1 week for MD assessment, labs (CBC with diff, CMP, Mg), IV Mg, and day 15 of cycle #2 daratumumab SQ.  I discussed the assessment and treatment plan with the patient.  The patient was provided an opportunity to ask questions and all were answered.  The patient agreed with the plan and demonstrated an understanding of the instructions.  The patient was advised to call back if the symptoms worsen or if the condition fails to improve as anticipated.   Lequita Asal, MD, PhD    11/01/2019, 10:41 AM    I, Jacqualyn Posey, am acting as a Education administrator for [employer]. Mike Gip, MD.   I, Krystol Rocco C. Mike Gip, MD, have reviewed the above documentation for accuracy and completeness, and I agree with the above.

## 2019-11-01 ENCOUNTER — Inpatient Hospital Stay: Payer: Medicare PPO

## 2019-11-01 ENCOUNTER — Other Ambulatory Visit: Payer: Self-pay

## 2019-11-01 ENCOUNTER — Inpatient Hospital Stay: Payer: Medicare PPO | Attending: Hematology and Oncology | Admitting: Hematology and Oncology

## 2019-11-01 ENCOUNTER — Encounter: Payer: Self-pay | Admitting: Hematology and Oncology

## 2019-11-01 VITALS — BP 120/80 | HR 101 | Temp 98.2°F | Resp 16 | Ht 62.0 in | Wt 148.6 lb

## 2019-11-01 DIAGNOSIS — R252 Cramp and spasm: Secondary | ICD-10-CM | POA: Insufficient documentation

## 2019-11-01 DIAGNOSIS — Z888 Allergy status to other drugs, medicaments and biological substances status: Secondary | ICD-10-CM | POA: Insufficient documentation

## 2019-11-01 DIAGNOSIS — C9000 Multiple myeloma not having achieved remission: Secondary | ICD-10-CM

## 2019-11-01 DIAGNOSIS — Z833 Family history of diabetes mellitus: Secondary | ICD-10-CM | POA: Insufficient documentation

## 2019-11-01 DIAGNOSIS — Z7952 Long term (current) use of systemic steroids: Secondary | ICD-10-CM | POA: Diagnosis not present

## 2019-11-01 DIAGNOSIS — Z7984 Long term (current) use of oral hypoglycemic drugs: Secondary | ICD-10-CM | POA: Insufficient documentation

## 2019-11-01 DIAGNOSIS — Z9481 Bone marrow transplant status: Secondary | ICD-10-CM | POA: Insufficient documentation

## 2019-11-01 DIAGNOSIS — R110 Nausea: Secondary | ICD-10-CM | POA: Insufficient documentation

## 2019-11-01 DIAGNOSIS — K5900 Constipation, unspecified: Secondary | ICD-10-CM | POA: Insufficient documentation

## 2019-11-01 DIAGNOSIS — Z5112 Encounter for antineoplastic immunotherapy: Secondary | ICD-10-CM | POA: Insufficient documentation

## 2019-11-01 DIAGNOSIS — R5383 Other fatigue: Secondary | ICD-10-CM | POA: Insufficient documentation

## 2019-11-01 DIAGNOSIS — Z87891 Personal history of nicotine dependence: Secondary | ICD-10-CM | POA: Insufficient documentation

## 2019-11-01 DIAGNOSIS — D649 Anemia, unspecified: Secondary | ICD-10-CM | POA: Diagnosis not present

## 2019-11-01 DIAGNOSIS — J029 Acute pharyngitis, unspecified: Secondary | ICD-10-CM | POA: Insufficient documentation

## 2019-11-01 DIAGNOSIS — Z8379 Family history of other diseases of the digestive system: Secondary | ICD-10-CM | POA: Insufficient documentation

## 2019-11-01 DIAGNOSIS — E8889 Other specified metabolic disorders: Secondary | ICD-10-CM | POA: Diagnosis not present

## 2019-11-01 DIAGNOSIS — R197 Diarrhea, unspecified: Secondary | ICD-10-CM | POA: Diagnosis not present

## 2019-11-01 DIAGNOSIS — R0602 Shortness of breath: Secondary | ICD-10-CM | POA: Diagnosis not present

## 2019-11-01 DIAGNOSIS — Z8249 Family history of ischemic heart disease and other diseases of the circulatory system: Secondary | ICD-10-CM | POA: Insufficient documentation

## 2019-11-01 DIAGNOSIS — E538 Deficiency of other specified B group vitamins: Secondary | ICD-10-CM | POA: Insufficient documentation

## 2019-11-01 DIAGNOSIS — Z818 Family history of other mental and behavioral disorders: Secondary | ICD-10-CM | POA: Insufficient documentation

## 2019-11-01 DIAGNOSIS — E860 Dehydration: Secondary | ICD-10-CM | POA: Insufficient documentation

## 2019-11-01 DIAGNOSIS — Z801 Family history of malignant neoplasm of trachea, bronchus and lung: Secondary | ICD-10-CM | POA: Insufficient documentation

## 2019-11-01 DIAGNOSIS — G620 Drug-induced polyneuropathy: Secondary | ICD-10-CM | POA: Insufficient documentation

## 2019-11-01 DIAGNOSIS — T451X5A Adverse effect of antineoplastic and immunosuppressive drugs, initial encounter: Secondary | ICD-10-CM | POA: Diagnosis not present

## 2019-11-01 DIAGNOSIS — Z803 Family history of malignant neoplasm of breast: Secondary | ICD-10-CM | POA: Insufficient documentation

## 2019-11-01 DIAGNOSIS — Z841 Family history of disorders of kidney and ureter: Secondary | ICD-10-CM | POA: Insufficient documentation

## 2019-11-01 DIAGNOSIS — I129 Hypertensive chronic kidney disease with stage 1 through stage 4 chronic kidney disease, or unspecified chronic kidney disease: Secondary | ICD-10-CM | POA: Diagnosis not present

## 2019-11-01 DIAGNOSIS — D709 Neutropenia, unspecified: Secondary | ICD-10-CM | POA: Insufficient documentation

## 2019-11-01 DIAGNOSIS — N183 Chronic kidney disease, stage 3 unspecified: Secondary | ICD-10-CM | POA: Insufficient documentation

## 2019-11-01 DIAGNOSIS — Z832 Family history of diseases of the blood and blood-forming organs and certain disorders involving the immune mechanism: Secondary | ICD-10-CM | POA: Insufficient documentation

## 2019-11-01 DIAGNOSIS — M1990 Unspecified osteoarthritis, unspecified site: Secondary | ICD-10-CM | POA: Diagnosis not present

## 2019-11-01 DIAGNOSIS — E1122 Type 2 diabetes mellitus with diabetic chronic kidney disease: Secondary | ICD-10-CM | POA: Insufficient documentation

## 2019-11-01 DIAGNOSIS — Z885 Allergy status to narcotic agent status: Secondary | ICD-10-CM | POA: Insufficient documentation

## 2019-11-01 DIAGNOSIS — R21 Rash and other nonspecific skin eruption: Secondary | ICD-10-CM | POA: Diagnosis not present

## 2019-11-01 DIAGNOSIS — Z800 Family history of malignant neoplasm of digestive organs: Secondary | ICD-10-CM | POA: Insufficient documentation

## 2019-11-01 DIAGNOSIS — M549 Dorsalgia, unspecified: Secondary | ICD-10-CM | POA: Diagnosis not present

## 2019-11-01 DIAGNOSIS — Z808 Family history of malignant neoplasm of other organs or systems: Secondary | ICD-10-CM | POA: Insufficient documentation

## 2019-11-01 DIAGNOSIS — Z79899 Other long term (current) drug therapy: Secondary | ICD-10-CM | POA: Insufficient documentation

## 2019-11-01 DIAGNOSIS — Z9484 Stem cells transplant status: Secondary | ICD-10-CM | POA: Insufficient documentation

## 2019-11-01 DIAGNOSIS — Z886 Allergy status to analgesic agent status: Secondary | ICD-10-CM | POA: Insufficient documentation

## 2019-11-01 DIAGNOSIS — Z7982 Long term (current) use of aspirin: Secondary | ICD-10-CM | POA: Insufficient documentation

## 2019-11-01 LAB — COMPREHENSIVE METABOLIC PANEL
ALT: 29 U/L (ref 0–44)
AST: 23 U/L (ref 15–41)
Albumin: 4.1 g/dL (ref 3.5–5.0)
Alkaline Phosphatase: 74 U/L (ref 38–126)
Anion gap: 10 (ref 5–15)
BUN: 16 mg/dL (ref 8–23)
CO2: 24 mmol/L (ref 22–32)
Calcium: 8.9 mg/dL (ref 8.9–10.3)
Chloride: 104 mmol/L (ref 98–111)
Creatinine, Ser: 0.93 mg/dL (ref 0.44–1.00)
GFR calc Af Amer: >60 mL/min (ref >60)
GFR calc non Af Amer: >60 mL/min (ref >60)
Glucose, Bld: 180 mg/dL — ABNORMAL HIGH (ref 70–99)
Potassium: 4.3 mmol/L (ref 3.5–5.1)
Sodium: 138 mmol/L (ref 135–145)
Total Bilirubin: 0.7 mg/dL (ref 0.3–1.2)
Total Protein: 6.6 g/dL (ref 6.5–8.1)

## 2019-11-01 LAB — CBC WITH DIFFERENTIAL/PLATELET
Abs Immature Granulocytes: 0.02 10*3/uL (ref 0.00–0.07)
Basophils Absolute: 0.1 10*3/uL (ref 0.0–0.1)
Basophils Relative: 2 %
Eosinophils Absolute: 0.2 10*3/uL (ref 0.0–0.5)
Eosinophils Relative: 4 %
HCT: 29.5 % — ABNORMAL LOW (ref 36.0–46.0)
Hemoglobin: 10.1 g/dL — ABNORMAL LOW (ref 12.0–15.0)
Immature Granulocytes: 1 %
Lymphocytes Relative: 7 %
Lymphs Abs: 0.3 10*3/uL — ABNORMAL LOW (ref 0.7–4.0)
MCH: 31.6 pg (ref 26.0–34.0)
MCHC: 34.2 g/dL (ref 30.0–36.0)
MCV: 92.2 fL (ref 80.0–100.0)
Monocytes Absolute: 0.2 10*3/uL (ref 0.1–1.0)
Monocytes Relative: 4 %
Neutro Abs: 3.5 10*3/uL (ref 1.7–7.7)
Neutrophils Relative %: 82 %
Platelets: 148 10*3/uL — ABNORMAL LOW (ref 150–400)
RBC: 3.2 MIL/uL — ABNORMAL LOW (ref 3.87–5.11)
RDW: 16.3 % — ABNORMAL HIGH (ref 11.5–15.5)
WBC: 4.2 10*3/uL (ref 4.0–10.5)
nRBC: 0 % (ref 0.0–0.2)

## 2019-11-01 LAB — MAGNESIUM: Magnesium: 1.1 mg/dL — ABNORMAL LOW (ref 1.7–2.4)

## 2019-11-01 MED ORDER — SODIUM CHLORIDE 0.9% FLUSH
10.0000 mL | INTRAVENOUS | Status: DC | PRN
  Administered 2019-11-01: 10 mL via INTRAVENOUS
  Filled 2019-11-01: qty 10

## 2019-11-01 MED ORDER — MAGNESIUM SULFATE 4 GM/100ML IV SOLN
4.0000 g | Freq: Once | INTRAVENOUS | Status: AC
  Administered 2019-11-01: 4 g via INTRAVENOUS

## 2019-11-01 MED ORDER — HEPARIN SOD (PORK) LOCK FLUSH 100 UNIT/ML IV SOLN
500.0000 [IU] | Freq: Once | INTRAVENOUS | Status: AC
  Administered 2019-11-01: 500 [IU] via INTRAVENOUS
  Filled 2019-11-01: qty 5

## 2019-11-01 MED ORDER — SODIUM CHLORIDE 0.9 % IV SOLN
Freq: Once | INTRAVENOUS | Status: AC
  Filled 2019-11-01: qty 250

## 2019-11-01 MED ORDER — DEXAMETHASONE 4 MG PO TABS
20.0000 mg | ORAL_TABLET | Freq: Once | ORAL | Status: AC
  Administered 2019-11-01: 20 mg via ORAL

## 2019-11-01 MED ORDER — DARATUMUMAB-HYALURONIDASE-FIHJ 1800-30000 MG-UT/15ML ~~LOC~~ SOLN
1800.0000 mg | Freq: Once | SUBCUTANEOUS | Status: AC
  Administered 2019-11-01: 1800 mg via SUBCUTANEOUS
  Filled 2019-11-01: qty 15

## 2019-11-01 MED ORDER — PROMETHAZINE HCL 25 MG/ML IJ SOLN
25.0000 mg | Freq: Once | INTRAMUSCULAR | Status: AC
  Administered 2019-11-01: 25 mg via INTRAVENOUS

## 2019-11-02 NOTE — Progress Notes (Signed)
Pharmacist Chemotherapy Monitoring - Follow Up Assessment    I verify that I have reviewed each item in the below checklist:  . Regimen for the patient is scheduled for the appropriate day and plan matches scheduled date. Marland Kitchen Appropriate non-routine labs are ordered dependent on drug ordered. . If applicable, additional medications reviewed and ordered per protocol based on lifetime cumulative doses and/or treatment regimen.   Plan for follow-up and/or issues identified: No . I-vent associated with next due treatment: No . MD and/or nursing notified: No  Denessa Cavan K 11/02/2019 1:31 PM

## 2019-11-04 ENCOUNTER — Inpatient Hospital Stay: Payer: Medicare PPO

## 2019-11-04 ENCOUNTER — Other Ambulatory Visit: Payer: Self-pay

## 2019-11-04 VITALS — BP 99/70 | HR 99 | Temp 97.9°F | Resp 16

## 2019-11-04 DIAGNOSIS — Z5112 Encounter for antineoplastic immunotherapy: Secondary | ICD-10-CM | POA: Diagnosis not present

## 2019-11-04 DIAGNOSIS — R110 Nausea: Secondary | ICD-10-CM | POA: Diagnosis not present

## 2019-11-04 DIAGNOSIS — E8889 Other specified metabolic disorders: Secondary | ICD-10-CM | POA: Diagnosis not present

## 2019-11-04 DIAGNOSIS — T451X5A Adverse effect of antineoplastic and immunosuppressive drugs, initial encounter: Secondary | ICD-10-CM | POA: Diagnosis not present

## 2019-11-04 DIAGNOSIS — E538 Deficiency of other specified B group vitamins: Secondary | ICD-10-CM | POA: Diagnosis not present

## 2019-11-04 DIAGNOSIS — C9000 Multiple myeloma not having achieved remission: Secondary | ICD-10-CM | POA: Diagnosis not present

## 2019-11-04 DIAGNOSIS — G620 Drug-induced polyneuropathy: Secondary | ICD-10-CM | POA: Diagnosis not present

## 2019-11-04 DIAGNOSIS — D649 Anemia, unspecified: Secondary | ICD-10-CM | POA: Diagnosis not present

## 2019-11-04 LAB — MAGNESIUM: Magnesium: 1.2 mg/dL — ABNORMAL LOW (ref 1.7–2.4)

## 2019-11-04 MED ORDER — HEPARIN SOD (PORK) LOCK FLUSH 100 UNIT/ML IV SOLN
500.0000 [IU] | Freq: Once | INTRAVENOUS | Status: AC
  Administered 2019-11-04: 500 [IU] via INTRAVENOUS
  Filled 2019-11-04: qty 5

## 2019-11-04 MED ORDER — MAGNESIUM SULFATE 4 GM/100ML IV SOLN
4.0000 g | Freq: Once | INTRAVENOUS | Status: AC
  Administered 2019-11-04: 4 g via INTRAVENOUS

## 2019-11-04 MED ORDER — SODIUM CHLORIDE 0.9% FLUSH
10.0000 mL | INTRAVENOUS | Status: DC | PRN
  Administered 2019-11-04: 10 mL via INTRAVENOUS
  Filled 2019-11-04: qty 10

## 2019-11-04 MED ORDER — SODIUM CHLORIDE 0.9 % IV SOLN
Freq: Once | INTRAVENOUS | Status: AC
  Filled 2019-11-04: qty 250

## 2019-11-04 NOTE — Patient Instructions (Signed)
Magnesium Sulfate injection What is this medicine? MAGNESIUM SULFATE (mag NEE zee um SUL fate) is an electrolyte injection commonly used to treat low magnesium levels in your blood. It is also used to prevent or control seizures in women with preeclampsia or eclampsia. This medicine may be used for other purposes; ask your health care provider or pharmacist if you have questions. What should I tell my health care provider before I take this medicine? They need to know if you have any of these conditions:  heart disease  history of irregular heart beat  kidney disease  an unusual or allergic reaction to magnesium sulfate, medicines, foods, dyes, or preservatives  pregnant or trying to get pregnant  breast-feeding How should I use this medicine? This medicine is for infusion into a vein. It is given by a health care professional in a hospital or clinic setting. Talk to your pediatrician regarding the use of this medicine in children. While this drug may be prescribed for selected conditions, precautions do apply. Overdosage: If you think you have taken too much of this medicine contact a poison control center or emergency room at once. NOTE: This medicine is only for you. Do not share this medicine with others. What if I miss a dose? This does not apply. What may interact with this medicine? This medicine may interact with the following medications:  certain medicines for anxiety or sleep  certain medicines for seizures like phenobarbital  digoxin  medicines that relax muscles for surgery  narcotic medicines for pain This list may not describe all possible interactions. Give your health care provider a list of all the medicines, herbs, non-prescription drugs, or dietary supplements you use. Also tell them if you smoke, drink alcohol, or use illegal drugs. Some items may interact with your medicine. What should I watch for while using this medicine? Your condition will be  monitored carefully while you are receiving this medicine. You may need blood work done while you are receiving this medicine. What side effects may I notice from receiving this medicine? Side effects that you should report to your doctor or health care professional as soon as possible:  allergic reactions like skin rash, itching or hives, swelling of the face, lips, or tongue  facial flushing  muscle weakness  signs and symptoms of low blood pressure like dizziness; feeling faint or lightheaded, falls; unusually weak or tired  signs and symptoms of a dangerous change in heartbeat or heart rhythm like chest pain; dizziness; fast or irregular heartbeat; palpitations; breathing problems  sweating This list may not describe all possible side effects. Call your doctor for medical advice about side effects. You may report side effects to FDA at 1-800-FDA-1088. Where should I keep my medicine? This drug is given in a hospital or clinic and will not be stored at home. NOTE: This sheet is a summary. It may not cover all possible information. If you have questions about this medicine, talk to your doctor, pharmacist, or health care provider.  2020 Elsevier/Gold Standard (2016-01-03 12:31:42)  

## 2019-11-04 NOTE — Progress Notes (Signed)
[facility]  [address], [address] [place], [place] [address] Phone: [phone]  Fax: [phone]   Clinic Day:  11/08/2019  Referring physician: Glean Hess, MD  Chief Complaint: Sarah [name] is a 64 y.o. 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  HPI: The patient was last seen in the medical oncology clinic on 11/01/2019. At that time, she was feeling better.  Nauseawas better controlled with Phenergan IV.  Exam was stable. Hematocrit was 29.5, hemoglobin 10.1, platelets 148,000, WBC 4,200. CMP was normal.  Magnesium was 1.1.  She received day 8 cycle #2 daratumumab SQ.   She continued Pomalyst and Decadron.  Patient received 4 gm of IV magnesium.   Magnesium was 1.2 on 11/04/2019. Patient received 4 gm of IV magnesium.   During the interim, she has been doing "better". She notes having a lot of leg cramps. She has to continue to rest because she can not walk far. She notes exertional shortness of breath. She continues to feel tired. She is being followed by cardiology.  Her neuropathy in her hands is better. The neuropathy in her toes is a little worse with intermittent sharp pains "here and there". She notes feeling anxious and shaky in the mornings which she attributes to the medications she is taking.   She notes a raspy/sore throat. She notes back pain in between her shoulder blades. Her bowels have been normal since Thursday. She report adding fiber to her coffee. Her nausea is still persistent but managed on medication. She feels like she is eating well, but notes some occasional early satiety. She notes her blood sugar has been running low lately. She is using biotene mouth wash to improve her dry mouth.   The patient took her presmedications prior to treatment today. She feels woozy from the  Benadryl.  I recommended the patient to hold her Pomalyst when she comes in for labs on Thursday, 11/11/2019, until her counts are known.  At this time last cycle, she was hospitalized with fever and neutropenia.  ANC was 400-700 x 3 days in the hospital.  She required growth factor support.  Counts may be better this time as her Pomalyst was decreased after the last cycle. Patient understood and agreed.    Past Medical History:  Diagnosis Date  . Abnormal stress test 02/14/2016   Overview:  Added automatically from request for surgery [id number]  . Anemia   . Anxiety   . Arthritis   . Bicuspid aortic valve   . CHF (congestive heart failure) ([place])   . CKD (chronic kidney disease) stage 3, GFR 30-59 ml/min   . Depression   . Diabetes mellitus ([place])   . Dizziness   . Fatty liver   . Frequent falls   . GERD (gastroesophageal reflux disease)   . Gout   . Heart murmur   . History of blood transfusion   . History of bone marrow transplant ([place])   . History of uterine fibroid   . Hx of cardiac catheterization 06/05/2016   Overview:  Normal coronaries 2017  . Hypertension   . Hypomagnesemia   . Multiple myeloma ([place])   . Personal history of chemotherapy   . Renal cyst     Past Surgical History:  Procedure Laterality Date  . ABDOMINAL HYSTERECTOMY    . 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 CARDIAC ELECTROPHYSIOLOGY MAPPING AND ABLATION    . CARPAL TUNNEL RELEASE Bilateral   . CHOLECYSTECTOMY  2008  . COLONOSCOPY WITH PROPOFOL N/A 05/07/2017   Procedure: COLONOSCOPY WITH PROPOFOL;  Surgeon: [name], MD;  Location: [facility] ENDOSCOPY;  Service: Gastroenterology;  Laterality: N/A;  . ESOPHAGOGASTRODUODENOSCOPY (EGD) WITH PROPOFOL N/A 05/07/2017   Procedure: ESOPHAGOGASTRODUODENOSCOPY (EGD) WITH PROPOFOL;  Surgeon: [name], MD;  Location: [facility] ENDOSCOPY;  Service: Gastroenterology;  Laterality: N/A;  . FOOT SURGERY Bilateral   . INCONTINENCE SURGERY  2009  . INTERSTIM IMPLANT PLACEMENT     . other     over active bladder  . OTHER SURGICAL HISTORY     bladder stimulator   . PARTIAL HYSTERECTOMY  03/1996   fibroids  . PORTA CATH INSERTION N/A 03/10/2019   Procedure: PORTA CATH INSERTION;  Surgeon: Algernon Huxley, MD;  Location: [place] CV LAB;  Service: Cardiovascular;  Laterality: N/A;  . TONSILLECTOMY  2007    Family History  Problem Relation Age of Onset  . Colon cancer Father   . Renal Disease Father   . Diabetes Mellitus II Father   . Melanoma Paternal Grandmother   . Breast cancer Maternal Aunt 81  . Anemia Mother   . Heart disease Mother   . Heart failure Mother   . Renal Disease Mother   . Congestive Heart Failure Mother   . Heart disease Maternal Uncle   . Throat cancer Maternal Uncle   . Lung cancer Maternal Uncle   . Liver disease Maternal Uncle   . Heart failure Maternal Uncle   . Hearing loss Son 66       Suicide     Social History:  reports that she quit smoking about 28 years ago. Her smoking use included cigarettes. She has a 20.00 pack-year smoking history. She has never used smokeless tobacco. She reports previous alcohol use. She reports that she does not use drugs. Patient has not had ETOH in several months. She is on disability. She notes exposure to perchloroethylene [facility]).She lives much of her adult life in [place]. She was married with 2 sons. Her husband passed away. Her 2 sons took their own lives (age 78 and 26). She worked in [employer] in [employer]. She went back to school and earned an [facility] and works for [employer] for several years.She liveswith her sistersin [place]. The patient is alone today.  Allergies:  Allergies  Allergen Reactions  . Oxycodone-Acetaminophen Anaphylaxis    Swelling and rash  . Celebrex [Celecoxib] Diarrhea  . Codeine   . Benadryl [Diphenhydramine] Palpitations  . Morphine Itching and Rash  . Ondansetron Diarrhea  . Tylenol [Acetaminophen] Itching and Rash    Current  Medications: Current Outpatient Medications  Medication Sig Dispense Refill  . acetaminophen (TYLENOL) 500 MG tablet Take 500 mg by mouth every 6 (six) hours as needed.    Marland Kitchen acyclovir (ZOVIRAX) 400 MG tablet Take 1 tablet (400 mg total) by mouth 2 (two) times daily. 60 tablet 11  . allopurinol (ZYLOPRIM) 100 MG tablet TAKE 1 TABLET(100 MG) BY MOUTH DAILY as needed 90 tablet 1  . aspirin 81 MG chewable tablet Chew 1 tablet (81 mg total) by mouth daily. 30 tablet 0  . diclofenac sodium (VOLTAREN) 1 % GEL Apply 2 g topically 4 (four) times daily. 100 g 1  . diphenhydrAMINE (BENADRYL) 50 MG capsule Take 50 mg by mouth as needed.    . fentaNYL (DURAGESIC - DOSED  MCG/HR) 25 MCG/HR patch Place 25 mcg onto the skin every 3 (three) days.     . fexofenadine (ALLEGRA) 180 MG tablet TAKE 1 TABLET(180 MG) BY MOUTH DAILY 30 tablet 5  . FLUoxetine (PROZAC) 40 MG capsule TAKE 1 CAPSULE EVERY DAY 90 capsule 1  . fluticasone (FLONASE) 50 MCG/ACT nasal spray Place 2 sprays into both nostrils daily. 16 g 2  . furosemide (LASIX) 20 MG tablet Take 20 mg by mouth as needed.     Marland Kitchen glucose blood (ONE TOUCH ULTRA TEST) test strip     . hydrOXYzine (ATARAX/VISTARIL) 10 MG tablet TAKE 1 TABLET(10 MG) BY MOUTH THREE TIMES DAILY AS NEEDED 90 tablet 0  . lactulose (CHRONULAC) 10 GM/15ML solution     . Magnesium Bisglycinate (MAG GLYCINATE) 100 MG TABS Take 100 mg by mouth 2 (two) times daily. 60 tablet 0  . metFORMIN (GLUCOPHAGE-XR) 500 MG 24 hr tablet Take 1 tablet (500 mg total) by mouth daily with breakfast. 90 tablet 1  . Misc Natural Products (OSTEO BI-FLEX ADV TRIPLE ST PO) Take 2 tablets by mouth daily.    . montelukast (SINGULAIR) 10 MG tablet Take 1 tablet by mouth on the day before treatment and for 2 days after treatment. 30 tablet 0  . Multiple Vitamins-Minerals (HAIR/SKIN/NAILS/BIOTIN PO) Take 1 capsule by mouth 3 (three) times daily.    . Omega 3-6-9 Fatty Acids (OMEGA 3-6-9 COMPLEX) CAPS Take by mouth.    Marland Kitchen  omeprazole (PRILOSEC) 40 MG capsule Take 1 capsule (40 mg total) by mouth daily. 90 capsule 1  . pomalidomide (POMALYST) 3 MG capsule Take 1 capsule (3 mg total) by mouth daily. [employer] Auth #  [id number]      Date Obtained 10/19/2019 21 capsule 0  . pravastatin (PRAVACHOL) 20 MG tablet TAKE 1 TABLET EVERY DAY 90 tablet 1  . tiZANidine (ZANAFLEX) 4 MG tablet Take 1 tablet (4 mg total) by mouth every 8 (eight) hours as needed for muscle spasms. 270 tablet 1  . traZODone (DESYREL) 100 MG tablet TAKE 1 TABLET AT BEDTIME  FOR  SLEEP 90 tablet 1  . dexamethasone (DECADRON) 4 MG tablet Take 5 tablets (39m) on days 2, 9, 16, 23 of cycle 1 and 2 Take 5 tablets (239m on days 2, 16 of cycle 3,4,5 and 6 Take 5 tablets (206mon days 2 of cycles 7 and beyond (Patient not taking: Reported on 11/08/2019) 120 tablet 0  . diphenoxylate-atropine (LOMOTIL) 2.5-0.025 MG tablet Take 2 tablets by mouth daily as needed for diarrhea or loose stools. (Patient not taking: Reported on 11/08/2019) 45 tablet 2  . fluconazole (DIFLUCAN) 100 MG tablet     . lidocaine-prilocaine (EMLA) cream APPLY TOPICALLY AS NEEDED. (Patient not taking: Reported on 11/08/2019) 30 g 0  . NARCAN 4 MG/0.1ML LIQD nasal spray kit 1 spray.    . promethazine (PHENERGAN) 25 MG tablet Take 1 tablet (25 mg total) by mouth 2 (two) times daily as needed for nausea or vomiting. (Patient not taking: Reported on 11/01/2019) 60 tablet 0  . promethazine-dextromethorphan (PROMETHAZINE-DM) 6.25-15 MG/5ML syrup Take 5 mLs by mouth 4 (four) times daily as needed for cough. (Patient not taking: Reported on 10/29/2019) 240 mL 0   No current facility-administered medications for this visit.   Facility-Administered Medications Ordered in Other Visits  Medication Dose Route Frequency Provider Last Rate Last Admin  . heparin lock flush 100 unit/mL  500 Units Intravenous Once CorLequita AsalD      .  sodium chloride flush (NS) 0.9 % injection 10 mL  10 mL Intravenous  PRN Lequita Asal, MD   10 mL at 11/08/19 2595    Review of Systems  Constitutional: Positive for malaise/fatigue (majority of the time). Negative for chills, diaphoresis, fever and weight loss (stable).       Doing "better".  HENT: Positive for sore throat (feels raspy). Negative for congestion, ear discharge, ear pain, hearing loss, nosebleeds, sinus pain and tinnitus.        Using biotene mouth wash for improve dry mouth.  Eyes: Negative for blurred vision.  Respiratory: Positive for shortness of breath (exertional). Negative for cough, hemoptysis and sputum production.   Cardiovascular: Positive for chest pain. Negative for palpitations and leg swelling.  Gastrointestinal: Positive for nausea (chronic s/p gallbladder surgery; controlled with medication). Negative for abdominal pain, blood in stool, constipation, diarrhea, heartburn (controlled on medication), melena and vomiting.       Normal bowels. Early satiety. Eating more fiber.  Genitourinary: Negative for dysuria, frequency, hematuria and urgency.       Bladder leakage and spasms.  Musculoskeletal: Positive for back pain (chronic; paraspinal tenderness), joint pain (arthritis) and myalgias (leg cramps; more frequent). Negative for neck pain.  Skin: Negative for itching and rash.       Blisters on right hand, resolved.  Neurological: Positive for sensory change (chronic numbness in fingers are better; toes are worse with intermittent pain). Negative for dizziness, tingling, weakness and headaches.       Feeling woozy from Benadryl.  Endo/Heme/Allergies: Does not bruise/bleed easily.       Diabetes.  Psychiatric/Behavioral: Negative for depression and memory loss. The patient is nervous/anxious (with associated shakiness in the mornings). The patient does not have insomnia.   All other systems reviewed and are negative.  Performance status (ECOG): 2  Vitals Blood pressure 105/66, pulse 82, temperature (!) 96.6 F (35.9 C),  temperature source Tympanic, resp. rate 18, height 5' 2"  (1.575 m), weight 147 lb 7.8 oz (66.9 kg), SpO2 99 %.   Physical Exam  Constitutional: She is oriented to person, place, and time. She appears well-developed and well-nourished. No distress.  HENT:  Head: Normocephalic and atraumatic.  Mouth/Throat: Oropharynx is clear and moist. No oropharyngeal exudate.  Curly dark blonde hair. Dentures. Mouth not as dry. Mask.  Eyes: Pupils are equal, round, and reactive to light. Conjunctivae and EOM are normal. No scleral icterus.  Glasses. Brown eyes.  Cardiovascular: Normal rate, regular rhythm and normal heart sounds.  No murmur heard. Pulmonary/Chest: Effort normal and breath sounds normal. No respiratory distress. She has no wheezes. She has no rales. She exhibits no tenderness.  Abdominal: Soft. Bowel sounds are normal. She exhibits no distension and no mass. There is no abdominal tenderness. There is no rebound and no guarding.  Musculoskeletal:        General: Tenderness (paraspinal muscles and bilateral lower extremities) present. No edema. Normal range of motion.     Cervical back: Normal range of motion and neck supple.  Lymphadenopathy:       Head (right side): No preauricular, no posterior auricular and no occipital adenopathy present.       Head (left side): No preauricular, no posterior auricular and no occipital adenopathy present.    She has no cervical adenopathy.    She has no axillary adenopathy.       Right: No inguinal and no supraclavicular adenopathy present.       Left: No inguinal and  no supraclavicular adenopathy present.  Neurological: She is alert and oriented to person, place, and time.  Skin: Skin is warm and dry. She is not diaphoretic.  Psychiatric: She has a normal mood and affect. Her behavior is normal. Judgment and thought content normal.  Nursing note and vitals reviewed.     Infusion on 11/08/2019  Component Date Value Ref Range Status  . WBC  11/08/2019 3.5* 4.0 - 10.5 K/uL Final  . RBC 11/08/2019 3.02* 3.87 - 5.11 MIL/uL Final  . Hemoglobin 11/08/2019 9.6* 12.0 - 15.0 g/dL Final  . HCT 11/08/2019 27.7* 36.0 - 46.0 % Final  . MCV 11/08/2019 91.7  80.0 - 100.0 fL Final  . MCH 11/08/2019 31.8  26.0 - 34.0 pg Final  . MCHC 11/08/2019 34.7  30.0 - 36.0 g/dL Final  . RDW 11/08/2019 16.8* 11.5 - 15.5 % Final  . Platelets 11/08/2019 90* 150 - 400 K/uL Final   Comment: Immature Platelet Fraction may be clinically indicated, consider ordering this additional test [id number]   . nRBC 11/08/2019 0.0  0.0 - 0.2 % Final  . Neutrophils Relative % 11/08/2019 64  % Final  . Neutro Abs 11/08/2019 2.2  1.7 - 7.7 K/uL Final  . Lymphocytes Relative 11/08/2019 12  % Final  . Lymphs Abs 11/08/2019 0.4* 0.7 - 4.0 K/uL Final  . Monocytes Relative 11/08/2019 11  % Final  . Monocytes Absolute 11/08/2019 0.4  0.1 - 1.0 K/uL Final  . Eosinophils Relative 11/08/2019 11  % Final  . Eosinophils Absolute 11/08/2019 0.4  0.0 - 0.5 K/uL Final  . Basophils Relative 11/08/2019 2  % Final  . Basophils Absolute 11/08/2019 0.1  0.0 - 0.1 K/uL Final  . Immature Granulocytes 11/08/2019 0  % Final  . Abs Immature Granulocytes 11/08/2019 0.01  0.00 - 0.07 K/uL Final   Performed at [facility], [address] [place], [place] [address]    Assessment:  Sarah [name] is a 64 y.o. 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 Bone marrow revealed 80% plasma cells. Lambda free light chains were 1340. She had nephrotic range proteinuria. She initially underwent induction with RVD. Revlimid maintenance was discontinued on 01/21/2017 secondary to intolerance.   Bone marrowaspirate andbiopsyon 01/18/2021revealed anormocellularmarrow withbut increased lambda-restricted plasma cells (9% aspirate, 40% CD138  immunohistochemistry).Findingswereconsistent with recurrent plasma cell myeloma.Flow cytometry revealed no monoclonal B-cell or phenotypically aberrant T-cell population. Cytogeneticswere 65, XX (normal). 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  M-spikehas been followed: 0 on 04/02/2016 -06/23/2019; and0.2 on 09/02/2017.  Lambda light chainshave been followed: 22.2 (ratio 0.56) on 07/03/2017, 30.8 (ratio 0.78) on 09/02/2017, 36.9 (ratio 0.40) on 10/21/2017, 37.4 (ratio 0.41) on 12/16/2017, 70.7(ratio 0.31) on 02/17/2018, 64.2 (ratio 0.27) on 04/07/2018, 78.9 (ratio 0.18) on 05/26/2018, 128.8 (ratio 0.17) on 08/06/2018, 181.5 (ratio 0.13) on 10/08/2018, 130.9 (ratio 0.13) on 10/20/2018, 160.7 (ratio 0.10)on 12/09/2018, 236.6 (ratio 0.07) on 02/01/2019, 363.6 (ratio 0.04) on 03/22/2019, 404.8 (ratio 0.04) on 04/05/2019, 420.7 (ratio 0.03) on 05/24/2019, 573.4 (ratio 0.03) on 06/23/2019, 451.05(ratio 0.02) on02/19/2021, and 47.2 (ratio 0.13) on 10/25/2019.  24 hour UPEPon 06/03/2019 revealed kappa free light chains95.76,lambda free light chains1,260.71, andratio 0.08.24 hourUPEPon 02/22/2021revealed totalproteinof755m/24 hrs withlambdafree light chains 1,084.141mL andratioof0.10(1.03-31.76). M spike inurinewas46.1%(36171m4 hrs).  Bone surveyon 04/08/2016 and11/28/2018 revealed no definite lytic lesion seen in the visualized skeleton.Bone surveyon 11/19/2018 revealed no suspicious lucent lesionsand no acute bony abnormality.PET scanon 07/12/2019  revealed no focal metabolic activity to suggest active myeloma within the skeleton. There wereno lytic lesions identified on the CT portion of the examorsoft tissue plasmacytomas. There was no evidence of multiple myeloma.  Pretreatment RBC phenotypeon 09/23/2019 waspositivefor C, e, DUFFY B, KIDD B, M, S, and s antigen; negativefor c, E, KELL, DUFFY A, KIDD A, and N  antigen.  Sheis day 15 ofcycle #2 daratumumab and hyaluronidase-fihj, Pomalyst, and Decadron (DPd)(09/27/2019 - 10/25/2019).  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 Zometa was discontinued in 01/2017. She has chronic nauseaon Phenergan.  She has B12 deficiency. B12 was 254 on 04/09/2017, 295 on 08/20/2018, and 391 on 10/08/2018. Shewasonoral B12.She received B12 monthly (last04/29/2021).Folate was18.6on 02/08/2019.  She has iron deficiency. Ferritin was 32 on 07/01/2019. She received Venoferon 07/15/2019 and 07/22/2019.  She was admitted to [facility] from 10/15/2019 - 10/20/2019 for fever and neutropenia.  Cultures were negative.  CXR was negative. She received broad spectrum antibiotics and daily Granix.  She received IVF for acute renal insufficiency due to diarrhea and dehydration.  Creatine was 1.66 on admission and 1.12 on discharge.  She received her second COVID-19vaccineon 10/09/2019.  Symptomatically, she is doing well.  Nausea is controlled.  Bowels have improved with fiber.  Mouth dryness has improved with Biotin.  Exam reveals paraspinal tenderness.  Hemoglobin is 9.6, platelets 90,000, WBC 3500 ([place] 2200),  Plan: 1.   Labs today: CBC with diff, CMP, Mg. 2. Stage III multiple myeloma Clinically, she continues to do well. Lambda free light chains have improved from451.05on02/19/2021to 47.2 after cycle #1. She is day 15 of cycle #2 daratumumab plus Pomalyst and dexamethasone (DPd).             She began cycle #2 Pomlayst on 10/26/2019.  She is tolerating treatment without progressive neuropathy.   She notes intermittent discomfort in her toes.  Review labs:  Counts adequate.  Day 15 daratumumab SQ today.    Continue Pomalyst and Decadron.  Discuss counts at this time last cycle (near identical) with drop in 3-4 days requiring admission for fever  and neutropenia.   Check counts on 11/11/2019 when she comes in for IV magnesium.   Hold Pomalyst that morning until counts confirmed adequate.   If patient is neutropenic, begin growth factor support (Zarxio 300 mcg SQ day) until no longer neutropenic to prevent hospitalization.    Preauth Zarxio  Treatment plan:    Pomalidomide (Pomalyst) 3 mg po q day 1-21.   Decadron 20 mg po day 1-2, day 8-9, 15-16, and 22-23 with cycle #1 and 2. Decadron 20 mg po on days 1-2 and 15-16 x 4 cycles (if needed). Daratumumab and hyaluronidase-fihj (Darzalez Faspro) 1800 mg SQ on days 1, 8, 15, and 22. Premeds for [employer]:  Singulair 10 mg po day before, day of, and 2 days. Ondansetron 8 mg po, Benadryl 50 mg po, and Tylenol 650 mg po.  Patient confirmed that she took premeds at home (Benadryl, Tylenol, Singulair).  Review no extension of Pomalyst even though started a day late (20 days only this cycle unless held for neutropenia).  Discuss symptom management. She has antiemetics and pain medications at home to use on a prn bases.  Interventions are adequate.    3. Normocytic anemia Hematocrit27.7. Hemoglobin9.6. MCV91.7.Platelets90,000today. Ferritin128 on 10/04/2019. TSH was normal on02/20/2020  Creatinine 1.10(CrCl54.87m/minute). Etiology secondary to underlying myeloma and treatment. 4. Grade II peripheral neuropathy Etiologysecondary toprior chemotherapy. Neuropathy slightly worse in toes.  Continue to monitor. 5.B12 deficiency Continue B12 monthly(next due 11/25/2019). Folate was18.6on 02/08/2019. Check folate annually. 6. Hypomagnesemia,  chronic Magnesium1.1 today. Magnesium 4 gm IV today. Continue IV magnesium supplementation twice a week (Mondays and Thursdays).  7.Nausea Patient has chronic nausea relieved only with Phenergan. Patient receives Phenergan 25 mg IV weekly with treatment. 8.Day 15 of cycle #2 daratumumab SQ. 9.   Magnesium 4 gm IV today. 10.   RTC on 11/11/2019 for labs (CBC with diff, Mg) and +/- IV Mg and +/- Zarxio. 11.   RTC in 1 week for MD assessment, labs (CBC with diff, CMP, Mg), IV Mg, and +/- day 22 of cycle #2 daratumumab SQ.  I discussed the assessment and treatment plan with the patient.  The patient was provided an opportunity to ask questions and all were answered.  The patient agreed with the plan and demonstrated an understanding of the instructions.  The patient was advised to call back if the symptoms worsen or if the condition fails to improve as anticipated.   Lequita Asal, MD, PhD    11/08/2019, 9:46 AM  I, Selena Batten, am acting as scribe for [employer]. Mike Gip, MD, PhD.  I, Joselin Crandell C. Mike Gip, MD, have reviewed the above documentation for accuracy and completeness, and I agree with the above.

## 2019-11-05 ENCOUNTER — Other Ambulatory Visit: Payer: Self-pay

## 2019-11-05 ENCOUNTER — Encounter: Payer: Self-pay | Admitting: Hematology and Oncology

## 2019-11-05 NOTE — Progress Notes (Signed)
No new changes noted today. The patient name and DOB has been verified by phone today. 

## 2019-11-08 ENCOUNTER — Inpatient Hospital Stay: Payer: Medicare PPO

## 2019-11-08 ENCOUNTER — Inpatient Hospital Stay (HOSPITAL_BASED_OUTPATIENT_CLINIC_OR_DEPARTMENT_OTHER): Payer: Medicare PPO | Admitting: Hematology and Oncology

## 2019-11-08 ENCOUNTER — Other Ambulatory Visit: Payer: Self-pay

## 2019-11-08 ENCOUNTER — Encounter: Payer: Self-pay | Admitting: Hematology and Oncology

## 2019-11-08 VITALS — BP 124/75 | HR 80 | Resp 18

## 2019-11-08 VITALS — BP 105/66 | HR 82 | Temp 96.6°F | Resp 18 | Ht 62.0 in | Wt 147.5 lb

## 2019-11-08 DIAGNOSIS — C9000 Multiple myeloma not having achieved remission: Secondary | ICD-10-CM

## 2019-11-08 DIAGNOSIS — G620 Drug-induced polyneuropathy: Secondary | ICD-10-CM | POA: Diagnosis not present

## 2019-11-08 DIAGNOSIS — Z5112 Encounter for antineoplastic immunotherapy: Secondary | ICD-10-CM

## 2019-11-08 DIAGNOSIS — E8889 Other specified metabolic disorders: Secondary | ICD-10-CM | POA: Diagnosis not present

## 2019-11-08 DIAGNOSIS — T451X5A Adverse effect of antineoplastic and immunosuppressive drugs, initial encounter: Secondary | ICD-10-CM | POA: Diagnosis not present

## 2019-11-08 DIAGNOSIS — R110 Nausea: Secondary | ICD-10-CM

## 2019-11-08 DIAGNOSIS — D649 Anemia, unspecified: Secondary | ICD-10-CM

## 2019-11-08 DIAGNOSIS — E538 Deficiency of other specified B group vitamins: Secondary | ICD-10-CM | POA: Diagnosis not present

## 2019-11-08 LAB — COMPREHENSIVE METABOLIC PANEL
ALT: 29 U/L (ref 0–44)
AST: 24 U/L (ref 15–41)
Albumin: 4.2 g/dL (ref 3.5–5.0)
Alkaline Phosphatase: 76 U/L (ref 38–126)
Anion gap: 10 (ref 5–15)
BUN: 17 mg/dL (ref 8–23)
CO2: 23 mmol/L (ref 22–32)
Calcium: 8.9 mg/dL (ref 8.9–10.3)
Chloride: 104 mmol/L (ref 98–111)
Creatinine, Ser: 0.94 mg/dL (ref 0.44–1.00)
GFR calc Af Amer: >60 mL/min (ref >60)
GFR calc non Af Amer: >60 mL/min (ref >60)
Glucose, Bld: 148 mg/dL — ABNORMAL HIGH (ref 70–99)
Potassium: 4 mmol/L (ref 3.5–5.1)
Sodium: 137 mmol/L (ref 135–145)
Total Bilirubin: 0.6 mg/dL (ref 0.3–1.2)
Total Protein: 6.8 g/dL (ref 6.5–8.1)

## 2019-11-08 LAB — CBC WITH DIFFERENTIAL/PLATELET
Abs Immature Granulocytes: 0.01 10*3/uL (ref 0.00–0.07)
Basophils Absolute: 0.1 10*3/uL (ref 0.0–0.1)
Basophils Relative: 2 %
Eosinophils Absolute: 0.4 10*3/uL (ref 0.0–0.5)
Eosinophils Relative: 11 %
HCT: 27.7 % — ABNORMAL LOW (ref 36.0–46.0)
Hemoglobin: 9.6 g/dL — ABNORMAL LOW (ref 12.0–15.0)
Immature Granulocytes: 0 %
Lymphocytes Relative: 12 %
Lymphs Abs: 0.4 10*3/uL — ABNORMAL LOW (ref 0.7–4.0)
MCH: 31.8 pg (ref 26.0–34.0)
MCHC: 34.7 g/dL (ref 30.0–36.0)
MCV: 91.7 fL (ref 80.0–100.0)
Monocytes Absolute: 0.4 10*3/uL (ref 0.1–1.0)
Monocytes Relative: 11 %
Neutro Abs: 2.2 10*3/uL (ref 1.7–7.7)
Neutrophils Relative %: 64 %
Platelets: 90 10*3/uL — ABNORMAL LOW (ref 150–400)
RBC: 3.02 MIL/uL — ABNORMAL LOW (ref 3.87–5.11)
RDW: 16.8 % — ABNORMAL HIGH (ref 11.5–15.5)
WBC: 3.5 10*3/uL — ABNORMAL LOW (ref 4.0–10.5)
nRBC: 0 % (ref 0.0–0.2)

## 2019-11-08 LAB — MAGNESIUM: Magnesium: 1.1 mg/dL — ABNORMAL LOW (ref 1.7–2.4)

## 2019-11-08 MED ORDER — MAGNESIUM SULFATE 4 GM/100ML IV SOLN
4.0000 g | Freq: Once | INTRAVENOUS | Status: AC
  Administered 2019-11-08: 4 g via INTRAVENOUS
  Filled 2019-11-08: qty 100

## 2019-11-08 MED ORDER — DEXAMETHASONE 4 MG PO TABS
20.0000 mg | ORAL_TABLET | Freq: Once | ORAL | Status: AC
  Administered 2019-11-08: 20 mg via ORAL

## 2019-11-08 MED ORDER — DARATUMUMAB-HYALURONIDASE-FIHJ 1800-30000 MG-UT/15ML ~~LOC~~ SOLN
1800.0000 mg | Freq: Once | SUBCUTANEOUS | Status: AC
  Administered 2019-11-08: 1800 mg via SUBCUTANEOUS
  Filled 2019-11-08: qty 15

## 2019-11-08 MED ORDER — PROMETHAZINE HCL 25 MG/ML IJ SOLN
25.0000 mg | Freq: Once | INTRAMUSCULAR | Status: AC
  Administered 2019-11-08: 25 mg via INTRAVENOUS

## 2019-11-08 MED ORDER — SODIUM CHLORIDE 0.9 % IV SOLN
Freq: Once | INTRAVENOUS | Status: AC
  Filled 2019-11-08: qty 250

## 2019-11-08 MED ORDER — HEPARIN SOD (PORK) LOCK FLUSH 100 UNIT/ML IV SOLN
500.0000 [IU] | Freq: Once | INTRAVENOUS | Status: AC
  Administered 2019-11-08: 500 [IU] via INTRAVENOUS
  Filled 2019-11-08: qty 5

## 2019-11-08 MED ORDER — SODIUM CHLORIDE 0.9% FLUSH
10.0000 mL | INTRAVENOUS | Status: DC | PRN
  Administered 2019-11-08: 10 mL via INTRAVENOUS
  Filled 2019-11-08: qty 10

## 2019-11-08 NOTE — Progress Notes (Signed)
Platelets 90. Proceed with treatment per Dr Mike Gip.

## 2019-11-08 NOTE — Progress Notes (Signed)
Pharmacist Chemotherapy Monitoring - Follow Up Assessment    I verify that I have reviewed each item in the below checklist:  . Regimen for the patient is scheduled for the appropriate day and plan matches scheduled date. Marland Kitchen Appropriate non-routine labs are ordered dependent on drug ordered. . If applicable, additional medications reviewed and ordered per protocol based on lifetime cumulative doses and/or treatment regimen.   Plan for follow-up and/or issues identified: No . I-vent associated with next due treatment: No . MD and/or nursing notified: No  Jyaire Koudelka K 11/08/2019 10:38 AM

## 2019-11-10 NOTE — Progress Notes (Signed)
This encounter was created in error - please disregard.

## 2019-11-11 ENCOUNTER — Other Ambulatory Visit: Payer: Self-pay | Admitting: *Deleted

## 2019-11-11 ENCOUNTER — Inpatient Hospital Stay (HOSPITAL_BASED_OUTPATIENT_CLINIC_OR_DEPARTMENT_OTHER): Payer: Medicare PPO

## 2019-11-11 ENCOUNTER — Other Ambulatory Visit: Payer: Self-pay

## 2019-11-11 ENCOUNTER — Other Ambulatory Visit: Payer: Self-pay | Admitting: Hematology and Oncology

## 2019-11-11 ENCOUNTER — Inpatient Hospital Stay: Payer: Medicare PPO | Attending: Hematology and Oncology

## 2019-11-11 VITALS — BP 110/70 | HR 94 | Temp 97.1°F | Resp 18

## 2019-11-11 DIAGNOSIS — Z8379 Family history of other diseases of the digestive system: Secondary | ICD-10-CM | POA: Diagnosis not present

## 2019-11-11 DIAGNOSIS — C9000 Multiple myeloma not having achieved remission: Secondary | ICD-10-CM

## 2019-11-11 DIAGNOSIS — K5900 Constipation, unspecified: Secondary | ICD-10-CM | POA: Insufficient documentation

## 2019-11-11 DIAGNOSIS — F324 Major depressive disorder, single episode, in partial remission: Secondary | ICD-10-CM | POA: Diagnosis not present

## 2019-11-11 DIAGNOSIS — G620 Drug-induced polyneuropathy: Secondary | ICD-10-CM | POA: Diagnosis not present

## 2019-11-11 DIAGNOSIS — E1122 Type 2 diabetes mellitus with diabetic chronic kidney disease: Secondary | ICD-10-CM | POA: Diagnosis not present

## 2019-11-11 DIAGNOSIS — Z832 Family history of diseases of the blood and blood-forming organs and certain disorders involving the immune mechanism: Secondary | ICD-10-CM | POA: Diagnosis not present

## 2019-11-11 DIAGNOSIS — Z79899 Other long term (current) drug therapy: Secondary | ICD-10-CM | POA: Insufficient documentation

## 2019-11-11 DIAGNOSIS — Z8249 Family history of ischemic heart disease and other diseases of the circulatory system: Secondary | ICD-10-CM | POA: Insufficient documentation

## 2019-11-11 DIAGNOSIS — R110 Nausea: Secondary | ICD-10-CM | POA: Diagnosis not present

## 2019-11-11 DIAGNOSIS — I129 Hypertensive chronic kidney disease with stage 1 through stage 4 chronic kidney disease, or unspecified chronic kidney disease: Secondary | ICD-10-CM | POA: Insufficient documentation

## 2019-11-11 DIAGNOSIS — R5383 Other fatigue: Secondary | ICD-10-CM | POA: Insufficient documentation

## 2019-11-11 DIAGNOSIS — Z822 Family history of deafness and hearing loss: Secondary | ICD-10-CM | POA: Diagnosis not present

## 2019-11-11 DIAGNOSIS — R519 Headache, unspecified: Secondary | ICD-10-CM | POA: Diagnosis not present

## 2019-11-11 DIAGNOSIS — Z808 Family history of malignant neoplasm of other organs or systems: Secondary | ICD-10-CM | POA: Insufficient documentation

## 2019-11-11 DIAGNOSIS — Z841 Family history of disorders of kidney and ureter: Secondary | ICD-10-CM | POA: Insufficient documentation

## 2019-11-11 DIAGNOSIS — T451X5A Adverse effect of antineoplastic and immunosuppressive drugs, initial encounter: Secondary | ICD-10-CM | POA: Insufficient documentation

## 2019-11-11 DIAGNOSIS — Z801 Family history of malignant neoplasm of trachea, bronchus and lung: Secondary | ICD-10-CM | POA: Insufficient documentation

## 2019-11-11 DIAGNOSIS — Z800 Family history of malignant neoplasm of digestive organs: Secondary | ICD-10-CM | POA: Diagnosis not present

## 2019-11-11 DIAGNOSIS — N183 Chronic kidney disease, stage 3 unspecified: Secondary | ICD-10-CM | POA: Insufficient documentation

## 2019-11-11 LAB — MAGNESIUM: Magnesium: 1.2 mg/dL — ABNORMAL LOW (ref 1.7–2.4)

## 2019-11-11 LAB — CBC WITH DIFFERENTIAL/PLATELET
Abs Immature Granulocytes: 0.01 10*3/uL (ref 0.00–0.07)
Basophils Absolute: 0.1 10*3/uL (ref 0.0–0.1)
Basophils Relative: 2 %
Eosinophils Absolute: 0.8 10*3/uL — ABNORMAL HIGH (ref 0.0–0.5)
Eosinophils Relative: 28 %
HCT: 29.9 % — ABNORMAL LOW (ref 36.0–46.0)
Hemoglobin: 10.4 g/dL — ABNORMAL LOW (ref 12.0–15.0)
Immature Granulocytes: 0 %
Lymphocytes Relative: 18 %
Lymphs Abs: 0.5 10*3/uL — ABNORMAL LOW (ref 0.7–4.0)
MCH: 32 pg (ref 26.0–34.0)
MCHC: 34.8 g/dL (ref 30.0–36.0)
MCV: 92 fL (ref 80.0–100.0)
Monocytes Absolute: 0.4 10*3/uL (ref 0.1–1.0)
Monocytes Relative: 15 %
Neutro Abs: 1 10*3/uL — ABNORMAL LOW (ref 1.7–7.7)
Neutrophils Relative %: 37 %
Platelets: 102 10*3/uL — ABNORMAL LOW (ref 150–400)
RBC: 3.25 MIL/uL — ABNORMAL LOW (ref 3.87–5.11)
RDW: 17.2 % — ABNORMAL HIGH (ref 11.5–15.5)
WBC: 2.9 10*3/uL — ABNORMAL LOW (ref 4.0–10.5)
nRBC: 0 % (ref 0.0–0.2)

## 2019-11-11 MED ORDER — PROMETHAZINE HCL 25 MG PO TABS: 25.0000 mg | ORAL_TABLET | Freq: Four times a day (QID) | ORAL | 0 refills | Status: DC | PRN

## 2019-11-11 MED ORDER — HEPARIN SOD (PORK) LOCK FLUSH 100 UNIT/ML IV SOLN
500.0000 [IU] | Freq: Once | INTRAVENOUS | Status: AC
  Administered 2019-11-11: 500 [IU] via INTRAVENOUS
  Filled 2019-11-11: qty 5

## 2019-11-11 MED ORDER — SODIUM CHLORIDE 0.9 % IV SOLN
INTRAVENOUS | Status: DC
  Filled 2019-11-11: qty 250

## 2019-11-11 MED ORDER — SODIUM CHLORIDE 0.9% FLUSH
10.0000 mL | INTRAVENOUS | Status: DC | PRN
  Administered 2019-11-11: 10 mL via INTRAVENOUS
  Filled 2019-11-11: qty 10

## 2019-11-11 MED ORDER — MAGNESIUM SULFATE 4 GM/100ML IV SOLN
4.0000 g | Freq: Once | INTRAVENOUS | Status: AC
  Administered 2019-11-11: 4 g via INTRAVENOUS

## 2019-11-11 MED ORDER — FILGRASTIM-SNDZ 480 MCG/0.8ML IJ SOSY
480.0000 ug | PREFILLED_SYRINGE | Freq: Once | INTRAMUSCULAR | Status: AC
  Administered 2019-11-11: 480 ug via SUBCUTANEOUS

## 2019-11-11 NOTE — Patient Instructions (Signed)

## 2019-11-11 NOTE — Progress Notes (Signed)
[facility]  [address] [place], [place] [address] Phone: [phone]  Fax: [phone]   Clinic Day:  11/12/2019  Referring physician: Glean Hess, MD  Chief Complaint: Sarah [name] is a 64 y.o. 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  HPI: The patient was last seen in the medical oncology clinic on 11/08/2019. At that time, she was doing well.  Nausea was controlled.  Bowels had improved with fiber.  Mouth dryness had improved with Biotin. Exam revealed paraspinal tenderness.  Hemoglobin was 9.6, platelets 90,000, WBC 3500 (ANC 2200).  She received day 15 daratumumab SQ.  She continued Pomalyst.  CBC on 11/11/2019 revealed a hematocrit of 29.9, hemoglobin 10.4, MCV 92.0, platelets 102,000, WBC 2900, ANC 1000. Magnesium was 1.2.   Because of her initial cycle of DPd with admission for fever and neutropenia and identical drop in counts, Pomalyst was held yesterday.  She received Zarxio (filgrastim-sndz) 480 mcg SQ.  She received magnesium 4 gm IV.  During the interim, she has felt very fatigued and 'shaky' today. She had a fever of 99.4  yesterday and it is currently at 99.3.  Her temperature will be checked again before she leaves. She has chronic shortness of breath upon exertion. Her bowel movements are normal. She denies any urinary symptoms.  She still has a sore throat.  She denies any rashes.  She states that the rash on her hands that she had with her first cycle was due to wearing latex gloves.   Past Medical History:  Diagnosis Date  . Abnormal stress test 02/14/2016   Overview:  Added automatically from request for surgery [id number]  . Anemia   . Anxiety   . Arthritis   . Bicuspid aortic valve   . CHF (congestive heart failure) ([place])   . CKD (chronic kidney disease) stage 3, GFR  30-59 ml/min   . Depression   . Diabetes mellitus ([place])   . Dizziness   . Fatty liver   . Frequent falls   . GERD (gastroesophageal reflux disease)   . Gout   . Heart murmur   . History of blood transfusion   . History of bone marrow transplant ([place])   . History of uterine fibroid   . Hx of cardiac catheterization 06/05/2016   Overview:  Normal coronaries 2017  . Hypertension   . Hypomagnesemia   . Multiple myeloma ([place])   . Personal history of chemotherapy   . Renal cyst     Past Surgical History:  Procedure Laterality Date  . ABDOMINAL HYSTERECTOMY    . 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 CARDIAC ELECTROPHYSIOLOGY MAPPING AND ABLATION    . CARPAL TUNNEL RELEASE Bilateral   . CHOLECYSTECTOMY  2008  . COLONOSCOPY WITH PROPOFOL N/A 05/07/2017   Procedure: COLONOSCOPY WITH PROPOFOL;  Surgeon: [name], MD;  Location: [facility] ENDOSCOPY;  Service: Gastroenterology;  Laterality: N/A;  . ESOPHAGOGASTRODUODENOSCOPY (EGD) WITH PROPOFOL N/A 05/07/2017   Procedure: ESOPHAGOGASTRODUODENOSCOPY (EGD) WITH PROPOFOL;  Surgeon: [name], MD;  Location: [facility] ENDOSCOPY;  Service: Gastroenterology;  Laterality: N/A;  . FOOT SURGERY Bilateral   . INCONTINENCE SURGERY  2009  . INTERSTIM IMPLANT PLACEMENT    . other     over active bladder  . OTHER SURGICAL HISTORY     bladder stimulator   . PARTIAL HYSTERECTOMY  03/1996   fibroids  . PORTA CATH INSERTION N/A 03/10/2019   Procedure: PORTA CATH INSERTION;  Surgeon: Algernon Huxley, MD;  Location: [facility] LAB;  Service: Cardiovascular;  Laterality: N/A;  . TONSILLECTOMY  2007    Family History  Problem Relation Age of Onset  . Colon cancer Father   . Renal Disease Father   . Diabetes Mellitus II Father   . Melanoma Paternal Grandmother   . Breast cancer Maternal Aunt 70  . Anemia Mother   . Heart disease Mother   . Heart failure Mother   . Renal Disease Mother   . Congestive Heart Failure Mother   . Heart disease Maternal  Uncle   . Throat cancer Maternal Uncle   . Lung cancer Maternal Uncle   . Liver disease Maternal Uncle   . Heart failure Maternal Uncle   . Hearing loss Son 16       Suicide     Social History:  reports that she quit smoking about 28 years ago. Her smoking use included cigarettes. She has a 20.00 pack-year smoking history. She has never used smokeless tobacco. She reports previous alcohol use. She reports that she does not use drugs. Patient has not had ETOH in several months. She is on disability. She notes exposure to perchloroethylene [facility]).She lives much of her adult life in [place]. She was married with 2 sons. Her husband passed away. Her 2 sons took their own lives (age 54 and 20). She worked in [employer] in [employer]. She went back to school and earned an [employer] and works for [employer] for several years.She liveswith her sistersin [place]. The patient is alone  today.   Allergies:  Allergies  Allergen Reactions  . Oxycodone-Acetaminophen Anaphylaxis    Swelling and rash  . Celebrex [Celecoxib] Diarrhea  . Codeine   . Benadryl [Diphenhydramine] Palpitations  . Morphine Itching and Rash  . Ondansetron Diarrhea  . Tylenol [Acetaminophen] Itching and Rash    Current Medications: Current Outpatient Medications  Medication Sig Dispense Refill  . acetaminophen (TYLENOL) 500 MG tablet Take 500 mg by mouth every 6 (six) hours as needed.    Marland Kitchen acyclovir (ZOVIRAX) 400 MG tablet Take 1 tablet (400 mg total) by mouth 2 (two) times daily. 60 tablet 11  . allopurinol (ZYLOPRIM) 100 MG tablet TAKE 1 TABLET(100 MG) BY MOUTH DAILY as needed 90 tablet 1  . aspirin 81 MG chewable tablet Chew 1 tablet (81 mg total) by mouth daily. 30 tablet 0  . diclofenac sodium (VOLTAREN) 1 % GEL Apply 2 g topically 4 (four) times daily. 100 g 1  . fentaNYL (DURAGESIC - DOSED MCG/HR) 25 MCG/HR patch Place 25 mcg onto the skin every 3 (three) days.     . fexofenadine (ALLEGRA)  180 MG tablet TAKE 1 TABLET(180 MG) BY MOUTH DAILY 30 tablet 5  . FLUoxetine (PROZAC) 40 MG capsule TAKE 1 CAPSULE EVERY DAY 90 capsule 1  . fluticasone (FLONASE) 50 MCG/ACT nasal spray Place 2 sprays into both nostrils daily. 16 g 2  . furosemide (LASIX) 20 MG tablet Take 20 mg by mouth as needed.     Marland Kitchen glucose blood (ONE TOUCH ULTRA TEST) test strip     . hydrOXYzine (ATARAX/VISTARIL) 10 MG tablet TAKE 1 TABLET(10 MG) BY MOUTH THREE TIMES DAILY AS NEEDED 90 tablet 0  . lactulose (CHRONULAC) 10 GM/15ML solution     . Magnesium Bisglycinate (MAG GLYCINATE) 100 MG TABS Take 100 mg  by mouth 2 (two) times daily. 60 tablet 0  . metFORMIN (GLUCOPHAGE-XR) 500 MG 24 hr tablet Take 1 tablet (500 mg total) by mouth daily with breakfast. 90 tablet 1  . Misc Natural Products (OSTEO BI-FLEX ADV TRIPLE ST PO) Take 2 tablets by mouth daily.    . Multiple Vitamins-Minerals (HAIR/SKIN/NAILS/BIOTIN PO) Take 1 capsule by mouth 3 (three) times daily.    Marland Kitchen NARCAN 4 MG/0.1ML LIQD nasal spray kit 1 spray.    . Omega 3-6-9 Fatty Acids (OMEGA 3-6-9 COMPLEX) CAPS Take by mouth.    Marland Kitchen omeprazole (PRILOSEC) 40 MG capsule Take 1 capsule (40 mg total) by mouth daily. 90 capsule 1  . tiZANidine (ZANAFLEX) 4 MG tablet Take 1 tablet (4 mg total) by mouth every 8 (eight) hours as needed for muscle spasms. 270 tablet 1  . traZODone (DESYREL) 100 MG tablet TAKE 1 TABLET AT BEDTIME  FOR  SLEEP 90 tablet 1  . dexamethasone (DECADRON) 4 MG tablet Take 5 tablets (49m) on days 2, 9, 16, 23 of cycle 1 and 2 Take 5 tablets (264m on days 2, 16 of cycle 3,4,5 and 6 Take 5 tablets (2047mon days 2 of cycles 7 and beyond (Patient not taking: Reported on 11/08/2019) 120 tablet 0  . diphenhydrAMINE (BENADRYL) 50 MG capsule Take 50 mg by mouth as needed.    . diphenoxylate-atropine (LOMOTIL) 2.5-0.025 MG tablet Take 2 tablets by mouth daily as needed for diarrhea or loose stools. (Patient not taking: Reported on 11/08/2019) 45 tablet 2  .  fluconazole (DIFLUCAN) 100 MG tablet     . lidocaine-prilocaine (EMLA) cream APPLY TOPICALLY AS NEEDED. (Patient not taking: Reported on 11/08/2019) 30 g 0  . montelukast (SINGULAIR) 10 MG tablet Take 1 tablet by mouth on the day before treatment and for 2 days after treatment. (Patient not taking: Reported on 11/12/2019) 30 tablet 0  . pomalidomide (POMALYST) 3 MG capsule Take 1 capsule (3 mg total) by mouth daily. [employer] Auth #  [id number]   Date Obtained 10/19/2019 (Patient not taking: Reported on 11/12/2019) 21 capsule 0  . pravastatin (PRAVACHOL) 20 MG tablet TAKE 1 TABLET EVERY DAY (Patient not taking: Reported on 11/12/2019) 90 tablet 1  . promethazine (PHENERGAN) 25 MG tablet Take 1 tablet (25 mg total) by mouth every 6 (six) hours as needed for nausea or vomiting. (Patient not taking: Reported on 11/12/2019) 90 tablet 0  . promethazine-dextromethorphan (PROMETHAZINE-DM) 6.25-15 MG/5ML syrup Take 5 mLs by mouth 4 (four) times daily as needed for cough. (Patient not taking: Reported on 10/29/2019) 240 mL 0   No current facility-administered medications for this visit.   Facility-Administered Medications Ordered in Other Visits  Medication Dose Route Frequency Provider Last Rate Last Admin  . 0.9 %  sodium chloride infusion   Intravenous Continuous CorLequita AsalD 250 mL/hr at 11/12/19 0845 New Bag at 11/12/19 0845    Review of Systems  Constitutional: Positive for fever (99.3) and malaise/fatigue (majority of the time). Negative for chills, diaphoresis and weight loss (stable).       Feels tired and 'shaky'.   HENT: Positive for sore throat (feels raspy). Negative for congestion, ear discharge, ear pain, hearing loss, nosebleeds, sinus pain and tinnitus.        Using biotene mouth wash for improve dry mouth.  Eyes: Negative for blurred vision.  Respiratory: Positive for shortness of breath (exertional). Negative for cough, hemoptysis and sputum production.   Cardiovascular: Positive  for chest  pain. Negative for palpitations and leg swelling.  Gastrointestinal: Positive for nausea (chronic s/p gallbladder surgery; controlled with Phenergan). Negative for abdominal pain, blood in stool, constipation, diarrhea, heartburn (controlled on medication), melena and vomiting.       Normal bowels. Early satiety. Eating more fiber.  Genitourinary: Negative for dysuria, frequency, hematuria and urgency.       Bladder leakage and spasms.  Musculoskeletal: Positive for back pain (chronic; paraspinal tenderness), joint pain (arthritis) and myalgias (leg cramps; more frequent). Negative for neck pain.  Skin: Negative for itching and rash.  Neurological: Positive for sensory change (chronic numbness in fingers are better; toes are worse with intermittent pain). Negative for dizziness, tingling and weakness.  Endo/Heme/Allergies: Negative for environmental allergies. Does not bruise/bleed easily.       Diabetes.  Psychiatric/Behavioral: Negative for depression and memory loss. The patient is nervous/anxious (with associated shakiness in the mornings). The patient does not have insomnia.   All other systems reviewed and are negative.  Performance status (ECOG):  1-2  Vitals Blood pressure 107/60, pulse 85, temperature (!) 96.4 F (35.8 C), temperature source Tympanic, resp. rate 16, weight 146 lb 7.9 oz (66.4 kg).   Physical Exam  Constitutional: She is oriented to person, place, and time. She appears well-developed and well-nourished. No distress.  HENT:  Head: Normocephalic and atraumatic.  Mouth/Throat: Oropharynx is clear and moist. No oropharyngeal exudate.  Curly dark blonde hair. Dentures. Mouth dry.  No erythema or exudate.  Mask.  Eyes: Pupils are equal, round, and reactive to light. Conjunctivae and EOM are normal. Right eye exhibits no discharge. Left eye exhibits no discharge. No scleral icterus.  Glasses. Brown eyes.  Neck: No JVD present.  Cardiovascular: Normal rate,  regular rhythm and normal heart sounds. Exam reveals no gallop and no friction rub.  No murmur heard. Pulmonary/Chest: Effort normal and breath sounds normal. No respiratory distress. She has no wheezes. She has no rales. She exhibits no tenderness.  Abdominal: Soft. Bowel sounds are normal. She exhibits no distension and no mass. There is no abdominal tenderness. There is no rebound and no guarding.  Musculoskeletal:        General: No edema. Normal range of motion.     Cervical back: Normal range of motion and neck supple.  Lymphadenopathy:       Head (right side): No preauricular, no posterior auricular and no occipital adenopathy present.       Head (left side): No preauricular, no posterior auricular and no occipital adenopathy present.    She has no cervical adenopathy.    She has no axillary adenopathy.       Right: No inguinal and no supraclavicular adenopathy present.       Left: No inguinal and no supraclavicular adenopathy present.  Neurological: She is alert and oriented to person, place, and time.  Skin: Skin is warm and dry. No rash noted. She is not diaphoretic. No erythema. No pallor.  Psychiatric: She has a normal mood and affect. Her behavior is normal. Judgment and thought content normal.  Nursing note and vitals reviewed.     Infusion on 11/12/2019  Component Date Value Ref Range Status  . WBC 11/12/2019 4.5  4.0 - 10.5 K/uL Final  . RBC 11/12/2019 3.09* 3.87 - 5.11 MIL/uL Final  . Hemoglobin 11/12/2019 9.9* 12.0 - 15.0 g/dL Final  . HCT 11/12/2019 28.8* 36.0 - 46.0 % Final  . MCV 11/12/2019 93.2  80.0 - 100.0 fL Final  . [facility] 11/12/2019  32.0  26.0 - 34.0 pg Final  . MCHC 11/12/2019 34.4  30.0 - 36.0 g/dL Final  . RDW 11/12/2019 17.4* 11.5 - 15.5 % Final  . Platelets 11/12/2019 108* 150 - 400 K/uL Final   Comment: Immature Platelet Fraction may be clinically indicated, consider ordering this additional test [id number]   . nRBC 11/12/2019 0.0  0.0 - 0.2 % Final    Performed at [facility], [address] [place], [place] [address]  . Neutrophils Relative % 11/12/2019 PENDING  % Incomplete  . Neutro Abs 11/12/2019 PENDING  1.7 - 7.7 K/uL Incomplete  . Band Neutrophils 11/12/2019 PENDING  % Incomplete  . Lymphocytes Relative 11/12/2019 PENDING  % Incomplete  . Lymphs Abs 11/12/2019 PENDING  0.7 - 4.0 K/uL Incomplete  . Monocytes Relative 11/12/2019 PENDING  % Incomplete  . Monocytes Absolute 11/12/2019 PENDING  0.1 - 1.0 K/uL Incomplete  . Eosinophils Relative 11/12/2019 PENDING  % Incomplete  . Eosinophils Absolute 11/12/2019 PENDING  0.0 - 0.5 K/uL Incomplete  . Basophils Relative 11/12/2019 PENDING  % Incomplete  . Basophils Absolute 11/12/2019 PENDING  0.0 - 0.1 K/uL Incomplete  . WBC Morphology 11/12/2019 PENDING   Incomplete  . RBC Morphology 11/12/2019 PENDING   Incomplete  . Smear Review 11/12/2019 PENDING   Incomplete  . Other 11/12/2019 PENDING  % Incomplete  . nRBC 11/12/2019 PENDING  0 /100 WBC Incomplete  . Metamyelocytes Relative 11/12/2019 PENDING  % Incomplete  . Myelocytes 11/12/2019 PENDING  % Incomplete  . Promyelocytes Relative 11/12/2019 PENDING  % Incomplete  . Blasts 11/12/2019 PENDING  % Incomplete  . Immature Granulocytes 11/12/2019 PENDING  % Incomplete  . Abs Immature Granulocytes 11/12/2019 PENDING  0.00 - 0.07 K/uL Incomplete  Infusion on 11/11/2019  Component Date Value Ref Range Status  . WBC 11/11/2019 2.9* 4.0 - 10.5 K/uL Final  . RBC 11/11/2019 3.25* 3.87 - 5.11 MIL/uL Final  . Hemoglobin 11/11/2019 10.4* 12.0 - 15.0 g/dL Final  . HCT 11/11/2019 29.9* 36.0 - 46.0 % Final  . MCV 11/11/2019 92.0  80.0 - 100.0 fL Final  . MCH 11/11/2019 32.0  26.0 - 34.0 pg Final  . MCHC 11/11/2019 34.8  30.0 - 36.0 g/dL Final  . RDW 11/11/2019 17.2* 11.5 - 15.5 % Final  . Platelets 11/11/2019 102* 150 - 400 K/uL Final   Comment: Immature Platelet Fraction may be clinically indicated, consider ordering this  additional test [id number]   . nRBC 11/11/2019 0.0  0.0 - 0.2 % Final  . Neutrophils Relative % 11/11/2019 37  % Final  . Neutro Abs 11/11/2019 1.0* 1.7 - 7.7 K/uL Final  . Lymphocytes Relative 11/11/2019 18  % Final  . Lymphs Abs 11/11/2019 0.5* 0.7 - 4.0 K/uL Final  . Monocytes Relative 11/11/2019 15  % Final  . Monocytes Absolute 11/11/2019 0.4  0.1 - 1.0 K/uL Final  . Eosinophils Relative 11/11/2019 28  % Final  . Eosinophils Absolute 11/11/2019 0.8* 0.0 - 0.5 K/uL Final  . Basophils Relative 11/11/2019 2  % Final  . Basophils Absolute 11/11/2019 0.1  0.0 - 0.1 K/uL Final  . Immature Granulocytes 11/11/2019 0  % Final  . Abs Immature Granulocytes 11/11/2019 0.01  0.00 - 0.07 K/uL Final   Performed at [facility], [address], [place], [place] [address]  . Magnesium 11/11/2019 1.2* 1.7 - 2.4 mg/dL Final   Performed at [facility], [address], [place], [place] [address]    Assessment:  Felecia Stanfill is a 64 y.o. 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 Bone marrow revealed 80% plasma cells. Lambda free light chains were 1340. She had nephrotic range proteinuria. She initially underwent induction with RVD. Revlimid maintenance was discontinued on 01/21/2017 secondary to intolerance.   Bone marrowaspirate andbiopsyon 01/18/2021revealed anormocellularmarrow withbut increased lambda-restricted plasma cells (9% aspirate, 40% CD138 immunohistochemistry).Findingswereconsistent with recurrent plasma cell myeloma.Flow cytometry revealed no monoclonal B-cell or phenotypically aberrant T-cell population. Cytogeneticswere 5, XX (normal). 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  M-spikehas been followed: 0 on 04/02/2016 -06/23/2019; and0.2 on 09/02/2017.  Lambda light chainshave been followed: 22.2 (ratio 0.56) on  07/03/2017, 30.8 (ratio 0.78) on 09/02/2017, 36.9 (ratio 0.40) on 10/21/2017, 37.4 (ratio 0.41) on 12/16/2017, 70.7(ratio 0.31) on 02/17/2018, 64.2 (ratio 0.27) on 04/07/2018, 78.9 (ratio 0.18) on 05/26/2018, 128.8 (ratio 0.17) on 08/06/2018, 181.5 (ratio 0.13) on 10/08/2018, 130.9 (ratio 0.13) on 10/20/2018, 160.7 (ratio 0.10)on 12/09/2018, 236.6 (ratio 0.07) on 02/01/2019, 363.6 (ratio 0.04) on 03/22/2019, 404.8 (ratio 0.04) on 04/05/2019, 420.7 (ratio 0.03) on 05/24/2019, 573.4 (ratio 0.03) on 06/23/2019, 451.05(ratio 0.02) on02/19/2021, and 47.2 (ratio 0.13) on 10/25/2019.  24 hour UPEPon 06/03/2019 revealed kappa free light chains95.76,lambda free light chains1,260.71, andratio 0.08.24 hourUPEPon 02/22/2021revealed totalproteinof762m/24 hrs withlambdafree light chains 1,084.144mL andratioof0.10(1.03-31.76). M spike inurinewas46.1%(36173m4 hrs).  Bone surveyon 04/08/2016 and11/28/2018 revealed no definite lytic lesion seen in the visualized skeleton.Bone surveyon 11/19/2018 revealed no suspicious lucent lesionsand no acute bony abnormality.PET scanon 07/12/2019 revealed no focal metabolic activity to suggest active myeloma within the skeleton. There wereno lytic lesions identified on the CT portion of the examorsoft tissue plasmacytomas. There was no evidence of multiple myeloma.  Pretreatment RBC phenotypeon 09/23/2019 waspositivefor C, e, DUFFY B, KIDD B, M, S, and s antigen; negativefor c, E, KELL, DUFFY A, KIDD A, and N antigen.  Sheis day 19 ofcycle #2 daratumumab and hyaluronidase-fihj, Pomalyst, and Decadron (DPd)(09/27/2019 - 10/25/2019).  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 Zometa was discontinued in 01/2017. She has chronic nauseaon Phenergan.  She has B12 deficiency. B12 was 254 on 04/09/2017, 295 on 08/20/2018, and 391 on 10/08/2018. Shewasonoral B12.She received B12 monthly  (last04/29/2021).Folate was18.6on 02/08/2019.  She has iron deficiency. Ferritin was 32 on 07/01/2019. She received Venoferon 07/15/2019 and 07/22/2019.  [facility] for fever and neutropenia.  Cultures were negative.  CXR was negative. She received broad spectrum antibiotics and daily Granix.  She received IVF for acute renal insufficiency due to diarrhea and dehydration.  Creatine was 1.66 on admission and 1.12 on discharge.  She received her second COVID-19vaccineon 10/09/2019.  Symptomatically, she is fatigued.  She has a low grade temperature.  Exam is stable.  WBC 4500 (ANCBroughton00).   Plan: 1.   Labs today: CBC with diff, BMP. 2. Stage III multiple myeloma Clinically, she remains fatigued. Lambda free light chains have improved from451.05on02/19/2021to 47.2 after cycle #1. She is day 19 of cycle #2 daratumumab plus Pomalyst and dexamethasone (DPd).             She began cycle #2 Pomlayst on 10/26/2019.  Pomalyst held on 11/11/2019 secondary to identical trend in counts with first cycle.   She received Zarxio on 11/11/2019.  Anticipate next cycle beginning 11/22/2019 (cycle length 28 days).  Treatment plan:    Pomalidomide (Pomalyst) 3 mg po q day 1-21.   Decadron 20  mg po day 1-2, day 8-9, 15-16, and 22-23 with cycle #1 and 2. Decadron 20 mg po on days 1-2 and 15-16 x 4 cycles (if needed). Daratumumab and hyaluronidase-fihj (Darzalez Faspro) 1800 mg SQ on days 1, 8, 15, and 22. Premeds for [employer]:  Singulair 10 mg po day before, day of, and 2 days. Ondansetron 8 mg po, Benadryl 50 mg po, and Tylenol 650 mg po.  Discuss symptom management.  She has antiemetics and pain medications at home to use on  a prn bases.  Interventions are adequate.   .    3. Normocytic anemia Hematocrit28.8. Hemoglobin9.9. MCV93.2.Platelets108,000today. Ferritin128 on 10/04/2019. TSH was normal on02/20/2020  Creatinine 1.10(CrCl54.92m/minute). Etiology secondary to underlying myeloma and current treatment.  Continue to monitor. 4. Low grade fever  Patient notes fatigue and a mild sore throat.  Exam is unrevealing.  WBC 4500 with an ANC of 2200 after Zarxio x 1 yesterday.  No plan for further growth factor support.  Discuss plan for immediate contact if fever over the weekend given admission for fever and neutropenia at this time with cycle #1. 5.   Grade II peripheral neuropathy Neuropathy appears stable. 6.B12 deficiency Continue B12 monthly(next due 11/25/2019). 7. Hypomagnesemia, chronic Magnesium was 1.2 on 11/11/2019. She received magnesium 4 gm IV. Magnesium not checked today.  Continue IV magnesium supplementation twice a week (Mondays and Thursdays).  8.Complete IVF today. 9.   Recheck vitals (temperature).  If temp increasing (100.4 or higher), then fever work-up (CXR and UA and culture). 10.   No Xarxio today. 11.   Patient to call clinic/MD over weekend if develops a fever. 12.   RTC on 11/15/2019 for MD assessment, labs (CBC with diff, CMP, Mg), and +/- day 22 of daratumumab SQ.  I discussed the assessment and treatment plan with the patient.  The patient was provided an opportunity to ask questions and all were answered.  The patient agreed with the plan and demonstrated an understanding of the instructions.  The patient was advised to call back if the symptoms worsen or if the condition fails to improve as anticipated.    MLequita Asal MD, PhD    11/12/2019, 9:44 AM  I, HHeywood Footman am acting as a  sEducation administratorfor MLequita Asal MD.  I, MSilver CreekCMike Gip MD, have reviewed the above documentation for accuracy and completeness, and I agree with the above.

## 2019-11-12 ENCOUNTER — Inpatient Hospital Stay (HOSPITAL_BASED_OUTPATIENT_CLINIC_OR_DEPARTMENT_OTHER): Payer: Medicare PPO | Admitting: Hematology and Oncology

## 2019-11-12 ENCOUNTER — Other Ambulatory Visit: Payer: Self-pay

## 2019-11-12 ENCOUNTER — Other Ambulatory Visit: Payer: Medicare PPO

## 2019-11-12 ENCOUNTER — Telehealth: Payer: Self-pay

## 2019-11-12 ENCOUNTER — Encounter: Payer: Self-pay | Admitting: Hematology and Oncology

## 2019-11-12 ENCOUNTER — Inpatient Hospital Stay: Payer: Medicare PPO

## 2019-11-12 VITALS — BP 110/65 | HR 84 | Temp 99.3°F | Resp 18

## 2019-11-12 VITALS — BP 107/60 | HR 85 | Temp 96.4°F | Resp 16 | Wt 146.5 lb

## 2019-11-12 DIAGNOSIS — I5032 Chronic diastolic (congestive) heart failure: Secondary | ICD-10-CM | POA: Diagnosis present

## 2019-11-12 DIAGNOSIS — R110 Nausea: Secondary | ICD-10-CM

## 2019-11-12 DIAGNOSIS — R5081 Fever presenting with conditions classified elsewhere: Secondary | ICD-10-CM | POA: Diagnosis not present

## 2019-11-12 DIAGNOSIS — C9000 Multiple myeloma not having achieved remission: Secondary | ICD-10-CM

## 2019-11-12 DIAGNOSIS — R5383 Other fatigue: Secondary | ICD-10-CM

## 2019-11-12 DIAGNOSIS — I350 Nonrheumatic aortic (valve) stenosis: Secondary | ICD-10-CM | POA: Diagnosis not present

## 2019-11-12 DIAGNOSIS — Z20822 Contact with and (suspected) exposure to covid-19: Secondary | ICD-10-CM | POA: Diagnosis present

## 2019-11-12 DIAGNOSIS — E1122 Type 2 diabetes mellitus with diabetic chronic kidney disease: Secondary | ICD-10-CM | POA: Diagnosis present

## 2019-11-12 DIAGNOSIS — K219 Gastro-esophageal reflux disease without esophagitis: Secondary | ICD-10-CM | POA: Diagnosis present

## 2019-11-12 DIAGNOSIS — Z9481 Bone marrow transplant status: Secondary | ICD-10-CM | POA: Diagnosis not present

## 2019-11-12 DIAGNOSIS — R296 Repeated falls: Secondary | ICD-10-CM | POA: Diagnosis present

## 2019-11-12 DIAGNOSIS — D61818 Other pancytopenia: Secondary | ICD-10-CM | POA: Diagnosis present

## 2019-11-12 DIAGNOSIS — I130 Hypertensive heart and chronic kidney disease with heart failure and stage 1 through stage 4 chronic kidney disease, or unspecified chronic kidney disease: Secondary | ICD-10-CM | POA: Diagnosis present

## 2019-11-12 DIAGNOSIS — Z87891 Personal history of nicotine dependence: Secondary | ICD-10-CM | POA: Diagnosis not present

## 2019-11-12 DIAGNOSIS — R05 Cough: Secondary | ICD-10-CM | POA: Diagnosis not present

## 2019-11-12 DIAGNOSIS — D709 Neutropenia, unspecified: Secondary | ICD-10-CM | POA: Diagnosis present

## 2019-11-12 DIAGNOSIS — R918 Other nonspecific abnormal finding of lung field: Secondary | ICD-10-CM | POA: Diagnosis not present

## 2019-11-12 DIAGNOSIS — Z9484 Stem cells transplant status: Secondary | ICD-10-CM

## 2019-11-12 DIAGNOSIS — E118 Type 2 diabetes mellitus with unspecified complications: Secondary | ICD-10-CM | POA: Diagnosis not present

## 2019-11-12 DIAGNOSIS — Z9221 Personal history of antineoplastic chemotherapy: Secondary | ICD-10-CM | POA: Diagnosis not present

## 2019-11-12 DIAGNOSIS — M1990 Unspecified osteoarthritis, unspecified site: Secondary | ICD-10-CM | POA: Diagnosis present

## 2019-11-12 DIAGNOSIS — N1831 Chronic kidney disease, stage 3a: Secondary | ICD-10-CM | POA: Diagnosis present

## 2019-11-12 DIAGNOSIS — F329 Major depressive disorder, single episode, unspecified: Secondary | ICD-10-CM | POA: Diagnosis present

## 2019-11-12 DIAGNOSIS — Q231 Congenital insufficiency of aortic valve: Secondary | ICD-10-CM | POA: Diagnosis not present

## 2019-11-12 DIAGNOSIS — R000 Tachycardia, unspecified: Secondary | ICD-10-CM | POA: Diagnosis not present

## 2019-11-12 DIAGNOSIS — J189 Pneumonia, unspecified organism: Secondary | ICD-10-CM | POA: Diagnosis present

## 2019-11-12 DIAGNOSIS — R509 Fever, unspecified: Secondary | ICD-10-CM

## 2019-11-12 DIAGNOSIS — A419 Sepsis, unspecified organism: Secondary | ICD-10-CM | POA: Diagnosis present

## 2019-11-12 DIAGNOSIS — K760 Fatty (change of) liver, not elsewhere classified: Secondary | ICD-10-CM | POA: Diagnosis present

## 2019-11-12 DIAGNOSIS — E872 Acidosis: Secondary | ICD-10-CM | POA: Diagnosis present

## 2019-11-12 DIAGNOSIS — Z90711 Acquired absence of uterus with remaining cervical stump: Secondary | ICD-10-CM | POA: Diagnosis not present

## 2019-11-12 DIAGNOSIS — F419 Anxiety disorder, unspecified: Secondary | ICD-10-CM | POA: Diagnosis present

## 2019-11-12 DIAGNOSIS — Z9049 Acquired absence of other specified parts of digestive tract: Secondary | ICD-10-CM | POA: Diagnosis not present

## 2019-11-12 LAB — CBC WITH DIFFERENTIAL/PLATELET
Abs Immature Granulocytes: 0.28 10*3/uL — ABNORMAL HIGH (ref 0.00–0.07)
Basophils Absolute: 0.1 10*3/uL (ref 0.0–0.1)
Basophils Relative: 1 %
Eosinophils Absolute: 0.7 10*3/uL — ABNORMAL HIGH (ref 0.0–0.5)
Eosinophils Relative: 15 %
HCT: 28.8 % — ABNORMAL LOW (ref 36.0–46.0)
Hemoglobin: 9.9 g/dL — ABNORMAL LOW (ref 12.0–15.0)
Immature Granulocytes: 6 %
Lymphocytes Relative: 15 %
Lymphs Abs: 0.7 10*3/uL (ref 0.7–4.0)
MCH: 32 pg (ref 26.0–34.0)
MCHC: 34.4 g/dL (ref 30.0–36.0)
MCV: 93.2 fL (ref 80.0–100.0)
Monocytes Absolute: 0.6 10*3/uL (ref 0.1–1.0)
Monocytes Relative: 14 %
Neutro Abs: 2.2 10*3/uL (ref 1.7–7.7)
Neutrophils Relative %: 49 %
Platelets: 108 10*3/uL — ABNORMAL LOW (ref 150–400)
RBC: 3.09 MIL/uL — ABNORMAL LOW (ref 3.87–5.11)
RDW: 17.4 % — ABNORMAL HIGH (ref 11.5–15.5)
WBC: 4.5 10*3/uL (ref 4.0–10.5)
nRBC: 0 % (ref 0.0–0.2)

## 2019-11-12 LAB — BASIC METABOLIC PANEL
Anion gap: 11 (ref 5–15)
BUN: 23 mg/dL (ref 8–23)
CO2: 22 mmol/L (ref 22–32)
Calcium: 8.7 mg/dL — ABNORMAL LOW (ref 8.9–10.3)
Chloride: 101 mmol/L (ref 98–111)
Creatinine, Ser: 1.01 mg/dL — ABNORMAL HIGH (ref 0.44–1.00)
GFR calc Af Amer: >60 mL/min (ref >60)
GFR calc non Af Amer: 59 mL/min — ABNORMAL LOW (ref >60)
Glucose, Bld: 146 mg/dL — ABNORMAL HIGH (ref 70–99)
Potassium: 3.3 mmol/L — ABNORMAL LOW (ref 3.5–5.1)
Sodium: 134 mmol/L — ABNORMAL LOW (ref 135–145)

## 2019-11-12 MED ORDER — SODIUM CHLORIDE 0.9% FLUSH
10.0000 mL | INTRAVENOUS | Status: DC | PRN
  Administered 2019-11-12: 10 mL via INTRAVENOUS
  Filled 2019-11-12: qty 10

## 2019-11-12 MED ORDER — SODIUM CHLORIDE 0.9 % IV SOLN
INTRAVENOUS | Status: DC
  Filled 2019-11-12 (×2): qty 250

## 2019-11-12 MED ORDER — HEPARIN SOD (PORK) LOCK FLUSH 100 UNIT/ML IV SOLN
500.0000 [IU] | Freq: Once | INTRAVENOUS | Status: AC
  Administered 2019-11-12: 500 [IU] via INTRAVENOUS
  Filled 2019-11-12: qty 5

## 2019-11-12 NOTE — Progress Notes (Signed)
No new changes today. Not taking Pomalyst

## 2019-11-12 NOTE — Progress Notes (Signed)
No new changes noted today. The patient Name and DOB has been verified by phone today. 

## 2019-11-12 NOTE — Telephone Encounter (Signed)
Spoke with the patient to inform her that her potassium was 3.3 today. I informed the patient, per dr Mike Gip she can eat potassium rich food or she can get a rx. The patient has agreed to eat potassium rich food.  The patient was understanding and agreeable.

## 2019-11-12 NOTE — Telephone Encounter (Signed)
-----   Message from Lequita Asal, MD sent at 11/12/2019  2:59 PM EDT ----- Regarding: Please call patient  Potassium is 3.3. She can increase potassium rich foods or receive an Rx for potassium.  M ----- Message ----- From: Buel Ream, Lab In [place] Sent: 11/12/2019   9:09 AM EDT To: Lequita Asal, MD

## 2019-11-13 ENCOUNTER — Other Ambulatory Visit: Payer: Self-pay

## 2019-11-13 ENCOUNTER — Inpatient Hospital Stay
Admission: EM | Admit: 2019-11-13 | Discharge: 2019-11-16 | DRG: 871 | Disposition: A | Discharge status: Home / Self Care | Payer: Medicare PPO | Attending: Internal Medicine | Admitting: Internal Medicine

## 2019-11-13 ENCOUNTER — Encounter: Payer: Self-pay | Admitting: Emergency Medicine

## 2019-11-13 ENCOUNTER — Emergency Department: Payer: Medicare PPO

## 2019-11-13 DIAGNOSIS — I130 Hypertensive heart and chronic kidney disease with heart failure and stage 1 through stage 4 chronic kidney disease, or unspecified chronic kidney disease: Secondary | ICD-10-CM | POA: Diagnosis present

## 2019-11-13 DIAGNOSIS — H9201 Otalgia, right ear: Secondary | ICD-10-CM | POA: Diagnosis present

## 2019-11-13 DIAGNOSIS — I10 Essential (primary) hypertension: Secondary | ICD-10-CM | POA: Diagnosis present

## 2019-11-13 DIAGNOSIS — A419 Sepsis, unspecified organism: Secondary | ICD-10-CM | POA: Diagnosis not present

## 2019-11-13 DIAGNOSIS — D61818 Other pancytopenia: Secondary | ICD-10-CM | POA: Diagnosis present

## 2019-11-13 DIAGNOSIS — I350 Nonrheumatic aortic (valve) stenosis: Secondary | ICD-10-CM

## 2019-11-13 DIAGNOSIS — F419 Anxiety disorder, unspecified: Secondary | ICD-10-CM | POA: Diagnosis present

## 2019-11-13 DIAGNOSIS — Z803 Family history of malignant neoplasm of breast: Secondary | ICD-10-CM

## 2019-11-13 DIAGNOSIS — Z9049 Acquired absence of other specified parts of digestive tract: Secondary | ICD-10-CM

## 2019-11-13 DIAGNOSIS — F329 Major depressive disorder, single episode, unspecified: Secondary | ICD-10-CM | POA: Diagnosis present

## 2019-11-13 DIAGNOSIS — Z90711 Acquired absence of uterus with remaining cervical stump: Secondary | ICD-10-CM

## 2019-11-13 DIAGNOSIS — Z20822 Contact with and (suspected) exposure to covid-19: Secondary | ICD-10-CM | POA: Diagnosis present

## 2019-11-13 DIAGNOSIS — C9000 Multiple myeloma not having achieved remission: Secondary | ICD-10-CM | POA: Diagnosis present

## 2019-11-13 DIAGNOSIS — I5032 Chronic diastolic (congestive) heart failure: Secondary | ICD-10-CM | POA: Diagnosis present

## 2019-11-13 DIAGNOSIS — N1831 Chronic kidney disease, stage 3a: Secondary | ICD-10-CM | POA: Diagnosis present

## 2019-11-13 DIAGNOSIS — C9001 Multiple myeloma in remission: Secondary | ICD-10-CM | POA: Diagnosis present

## 2019-11-13 DIAGNOSIS — Z9221 Personal history of antineoplastic chemotherapy: Secondary | ICD-10-CM

## 2019-11-13 DIAGNOSIS — Z800 Family history of malignant neoplasm of digestive organs: Secondary | ICD-10-CM

## 2019-11-13 DIAGNOSIS — M1990 Unspecified osteoarthritis, unspecified site: Secondary | ICD-10-CM | POA: Diagnosis present

## 2019-11-13 DIAGNOSIS — E876 Hypokalemia: Secondary | ICD-10-CM | POA: Diagnosis present

## 2019-11-13 DIAGNOSIS — E1122 Type 2 diabetes mellitus with diabetic chronic kidney disease: Secondary | ICD-10-CM | POA: Diagnosis present

## 2019-11-13 DIAGNOSIS — K760 Fatty (change of) liver, not elsewhere classified: Secondary | ICD-10-CM | POA: Diagnosis present

## 2019-11-13 DIAGNOSIS — N183 Chronic kidney disease, stage 3 unspecified: Secondary | ICD-10-CM | POA: Diagnosis present

## 2019-11-13 DIAGNOSIS — Z7982 Long term (current) use of aspirin: Secondary | ICD-10-CM

## 2019-11-13 DIAGNOSIS — Z79899 Other long term (current) drug therapy: Secondary | ICD-10-CM

## 2019-11-13 DIAGNOSIS — R296 Repeated falls: Secondary | ICD-10-CM | POA: Diagnosis present

## 2019-11-13 DIAGNOSIS — Z885 Allergy status to narcotic agent status: Secondary | ICD-10-CM

## 2019-11-13 DIAGNOSIS — Z888 Allergy status to other drugs, medicaments and biological substances status: Secondary | ICD-10-CM

## 2019-11-13 DIAGNOSIS — K219 Gastro-esophageal reflux disease without esophagitis: Secondary | ICD-10-CM | POA: Diagnosis present

## 2019-11-13 DIAGNOSIS — E872 Acidosis: Secondary | ICD-10-CM | POA: Diagnosis present

## 2019-11-13 DIAGNOSIS — Z9484 Stem cells transplant status: Secondary | ICD-10-CM

## 2019-11-13 DIAGNOSIS — Z7984 Long term (current) use of oral hypoglycemic drugs: Secondary | ICD-10-CM

## 2019-11-13 DIAGNOSIS — Z808 Family history of malignant neoplasm of other organs or systems: Secondary | ICD-10-CM

## 2019-11-13 DIAGNOSIS — Z87891 Personal history of nicotine dependence: Secondary | ICD-10-CM

## 2019-11-13 DIAGNOSIS — Z833 Family history of diabetes mellitus: Secondary | ICD-10-CM

## 2019-11-13 DIAGNOSIS — Z886 Allergy status to analgesic agent status: Secondary | ICD-10-CM

## 2019-11-13 DIAGNOSIS — Z841 Family history of disorders of kidney and ureter: Secondary | ICD-10-CM

## 2019-11-13 DIAGNOSIS — Z8249 Family history of ischemic heart disease and other diseases of the circulatory system: Secondary | ICD-10-CM

## 2019-11-13 DIAGNOSIS — D709 Neutropenia, unspecified: Secondary | ICD-10-CM | POA: Diagnosis present

## 2019-11-13 DIAGNOSIS — Z9481 Bone marrow transplant status: Secondary | ICD-10-CM

## 2019-11-13 DIAGNOSIS — E118 Type 2 diabetes mellitus with unspecified complications: Secondary | ICD-10-CM | POA: Diagnosis present

## 2019-11-13 DIAGNOSIS — Q231 Congenital insufficiency of aortic valve: Secondary | ICD-10-CM

## 2019-11-13 DIAGNOSIS — Z801 Family history of malignant neoplasm of trachea, bronchus and lung: Secondary | ICD-10-CM

## 2019-11-13 DIAGNOSIS — J189 Pneumonia, unspecified organism: Secondary | ICD-10-CM

## 2019-11-13 LAB — COMPREHENSIVE METABOLIC PANEL
ALT: 48 U/L — ABNORMAL HIGH (ref 0–44)
AST: 33 U/L (ref 15–41)
Albumin: 4 g/dL (ref 3.5–5.0)
Alkaline Phosphatase: 84 U/L (ref 38–126)
Anion gap: 9 (ref 5–15)
BUN: 15 mg/dL (ref 8–23)
CO2: 23 mmol/L (ref 22–32)
Calcium: 9 mg/dL (ref 8.9–10.3)
Chloride: 107 mmol/L (ref 98–111)
Creatinine, Ser: 1.02 mg/dL — ABNORMAL HIGH (ref 0.44–1.00)
GFR calc Af Amer: >60 mL/min (ref >60)
GFR calc non Af Amer: 58 mL/min — ABNORMAL LOW (ref >60)
Glucose, Bld: 160 mg/dL — ABNORMAL HIGH (ref 70–99)
Potassium: 3.7 mmol/L (ref 3.5–5.1)
Sodium: 139 mmol/L (ref 135–145)
Total Bilirubin: 0.9 mg/dL (ref 0.3–1.2)
Total Protein: 6.3 g/dL — ABNORMAL LOW (ref 6.5–8.1)

## 2019-11-13 LAB — URINALYSIS, COMPLETE (UACMP) WITH MICROSCOPIC
Bacteria, UA: NONE SEEN
Bilirubin Urine: NEGATIVE
Glucose, UA: NEGATIVE mg/dL
Hgb urine dipstick: NEGATIVE
Ketones, ur: NEGATIVE mg/dL
Nitrite: NEGATIVE
Protein, ur: NEGATIVE mg/dL
Specific Gravity, Urine: 1.01 (ref 1.005–1.030)
pH: 5 (ref 5.0–8.0)

## 2019-11-13 LAB — CBC WITH DIFFERENTIAL/PLATELET
Abs Immature Granulocytes: 0.48 10*3/uL — ABNORMAL HIGH (ref 0.00–0.07)
Basophils Absolute: 0.1 10*3/uL (ref 0.0–0.1)
Basophils Relative: 1 %
Eosinophils Absolute: 0.2 10*3/uL (ref 0.0–0.5)
Eosinophils Relative: 5 %
HCT: 27.9 % — ABNORMAL LOW (ref 36.0–46.0)
Hemoglobin: 9.6 g/dL — ABNORMAL LOW (ref 12.0–15.0)
Immature Granulocytes: 11 %
Lymphocytes Relative: 13 %
Lymphs Abs: 0.5 10*3/uL — ABNORMAL LOW (ref 0.7–4.0)
MCH: 32.4 pg (ref 26.0–34.0)
MCHC: 34.4 g/dL (ref 30.0–36.0)
MCV: 94.3 fL (ref 80.0–100.0)
Monocytes Absolute: 1 10*3/uL (ref 0.1–1.0)
Monocytes Relative: 22 %
Neutro Abs: 2 10*3/uL (ref 1.7–7.7)
Neutrophils Relative %: 48 %
Platelets: 123 10*3/uL — ABNORMAL LOW (ref 150–400)
RBC: 2.96 MIL/uL — ABNORMAL LOW (ref 3.87–5.11)
RDW: 16.9 % — ABNORMAL HIGH (ref 11.5–15.5)
WBC: 4.3 10*3/uL (ref 4.0–10.5)
nRBC: 0 % (ref 0.0–0.2)

## 2019-11-13 LAB — LACTIC ACID, PLASMA
Lactic Acid, Venous: 2 mmol/L (ref 0.5–1.9)
Lactic Acid, Venous: 1.3 mmol/L (ref 0.5–1.9)

## 2019-11-13 LAB — BLOOD GAS, VENOUS
Acid-base deficit: 0.7 mmol/L (ref 0.0–2.0)
Bicarbonate: 23.4 mmol/L (ref 20.0–28.0)
O2 Saturation: 95 %
Patient temperature: 37
pCO2, Ven: 36 mmHg — ABNORMAL LOW (ref 44.0–60.0)
pH, Ven: 7.42 (ref 7.250–7.430)
pO2, Ven: 74 mmHg — ABNORMAL HIGH (ref 32.0–45.0)

## 2019-11-13 LAB — GLUCOSE, CAPILLARY: Glucose-Capillary: 128 mg/dL — ABNORMAL HIGH (ref 70–99)

## 2019-11-13 LAB — SARS CORONAVIRUS 2 BY RT PCR (HOSPITAL ORDER, PERFORMED IN ~~LOC~~ HOSPITAL LAB): SARS Coronavirus 2: NEGATIVE

## 2019-11-13 LAB — PROCALCITONIN: Procalcitonin: <0.1 ng/mL

## 2019-11-13 MED ORDER — VANCOMYCIN HCL IN DEXTROSE 1-5 GM/200ML-% IV SOLN
1000.0000 mg | Freq: Once | INTRAVENOUS | Status: AC
  Administered 2019-11-13: 1000 mg via INTRAVENOUS
  Filled 2019-11-13: qty 200

## 2019-11-13 MED ORDER — PROMETHAZINE HCL 25 MG PO TABS
12.5000 mg | ORAL_TABLET | Freq: Four times a day (QID) | ORAL | Status: DC | PRN
  Administered 2019-11-14 – 2019-11-15 (×4): 12.5 mg via ORAL
  Filled 2019-11-13 (×5): qty 1

## 2019-11-13 MED ORDER — SODIUM CHLORIDE 0.9 % IV SOLN: Freq: Once | INTRAVENOUS | Status: AC

## 2019-11-13 MED ORDER — VANCOMYCIN HCL 1250 MG/250ML IV SOLN
1250.0000 mg | INTRAVENOUS | Status: DC
  Administered 2019-11-14 – 2019-11-15 (×2): 1250 mg via INTRAVENOUS
  Filled 2019-11-13 (×2): qty 250

## 2019-11-13 MED ORDER — VANCOMYCIN HCL 1250 MG/250ML IV SOLN: 1250.0000 mg | INTRAVENOUS | Status: DC

## 2019-11-13 MED ORDER — INSULIN ASPART 100 UNIT/ML ~~LOC~~ SOLN
0.0000 [IU] | Freq: Three times a day (TID) | SUBCUTANEOUS | Status: DC
  Administered 2019-11-14: 3 [IU] via SUBCUTANEOUS
  Administered 2019-11-14: 2 [IU] via SUBCUTANEOUS
  Administered 2019-11-15: 3 [IU] via SUBCUTANEOUS
  Administered 2019-11-15 – 2019-11-16 (×3): 2 [IU] via SUBCUTANEOUS
  Filled 2019-11-13 (×6): qty 1

## 2019-11-13 MED ORDER — ENOXAPARIN SODIUM 40 MG/0.4ML ~~LOC~~ SOLN
40.0000 mg | SUBCUTANEOUS | Status: DC
  Administered 2019-11-13 – 2019-11-15 (×3): 40 mg via SUBCUTANEOUS
  Filled 2019-11-13 (×3): qty 0.4

## 2019-11-13 MED ORDER — INSULIN ASPART 100 UNIT/ML ~~LOC~~ SOLN: 0.0000 [IU] | Freq: Every day | SUBCUTANEOUS | Status: DC

## 2019-11-13 MED ORDER — VANCOMYCIN HCL IN DEXTROSE 1-5 GM/200ML-% IV SOLN: 1000.0000 mg | Freq: Once | INTRAVENOUS | Status: DC

## 2019-11-13 MED ORDER — ACETAMINOPHEN 650 MG RE SUPP: 650.0000 mg | Freq: Four times a day (QID) | RECTAL | Status: DC | PRN

## 2019-11-13 MED ORDER — ACETAMINOPHEN 325 MG PO TABS
650.0000 mg | ORAL_TABLET | Freq: Four times a day (QID) | ORAL | Status: DC | PRN
  Administered 2019-11-14 – 2019-11-16 (×3): 650 mg via ORAL
  Filled 2019-11-13 (×3): qty 2

## 2019-11-13 MED ORDER — SODIUM CHLORIDE 0.9 % IV SOLN: INTRAVENOUS | Status: DC

## 2019-11-13 MED ORDER — LEVOFLOXACIN IN D5W 750 MG/150ML IV SOLN
750.0000 mg | Freq: Once | INTRAVENOUS | Status: AC
  Administered 2019-11-13: 750 mg via INTRAVENOUS
  Filled 2019-11-13: qty 150

## 2019-11-13 MED ORDER — SODIUM CHLORIDE 0.9 % IV BOLUS (SEPSIS)
1000.0000 mL | Freq: Once | INTRAVENOUS | Status: AC
  Administered 2019-11-13: 1000 mL via INTRAVENOUS

## 2019-11-13 MED ORDER — SODIUM CHLORIDE 0.9 % IV SOLN
2.0000 g | Freq: Two times a day (BID) | INTRAVENOUS | Status: DC
  Administered 2019-11-14 – 2019-11-15 (×3): 2 g via INTRAVENOUS
  Filled 2019-11-13 (×5): qty 2

## 2019-11-13 MED ORDER — SODIUM CHLORIDE 0.9 % IV SOLN
2.0000 g | Freq: Once | INTRAVENOUS | Status: AC
  Administered 2019-11-13: 2 g via INTRAVENOUS
  Filled 2019-11-13: qty 2

## 2019-11-13 NOTE — ED Notes (Signed)
Attempted to call report, told RN not available. Left callback number, let unit know they can call me back with any questions, and I will call again before transporting pt

## 2019-11-13 NOTE — ED Notes (Signed)
ED TO INPATIENT HANDOFF REPORT  ED Nurse Name and Phone #: Olen Cordial [phone]  S Name/Age/Gender Sarah [name] 64 y.o. female Room/Bed: ED11A/ED11A  Code Status   Code Status: Full Code  Home/SNF/Other Home Patient oriented to: self, place, time and situation Is this baseline? Yes   Triage Complete: Triage complete  Chief Complaint Sepsis [facility]) [A41.9]  Triage Note Pt to ED via POV, pt states that she has hx/o multiple myeloma. Pt states that she started running fever today. Pt has been trying to call the cancer center but unable to get in touch with anyone. Pt reports having congestion and sore throat as well. Pt states that she feels like there is phlegm trying to come up but she is unable to get it out. Last took chemo pill on Wednesday. Pt has port accessed currently.     Allergies Allergies  Allergen Reactions  . Oxycodone-Acetaminophen Anaphylaxis    Swelling and rash  . Celebrex [Celecoxib] Diarrhea  . Codeine   . Benadryl [Diphenhydramine] Palpitations  . Morphine Itching and Rash  . Ondansetron Diarrhea  . Tylenol [Acetaminophen] Itching and Rash    Level of Care/Admitting Diagnosis ED Disposition    ED Disposition Condition [facility]: [place] [[id number]]  Level of Care: Med-Surg [16]  Covid Evaluation: Symptomatic Person Under Investigation (PUI)  Diagnosis: Sepsis [facility]) [[id number]]  Admitting Physician: Athena Masse [[id number]]  Attending Physician: Athena Masse [[id number]]       B Medical/Surgery History Past Medical History:  Diagnosis Date  . Abnormal stress test 02/14/2016   Overview:  Added automatically from request for surgery [id number]  . Anemia   . Anxiety   . Arthritis   . Bicuspid aortic valve   . CHF (congestive heart failure) ([place])   . CKD (chronic kidney disease) stage 3, GFR 30-59 ml/min   . Depression   . Diabetes mellitus ([place])   . Dizziness   . Fatty liver   . Frequent falls   . GERD  (gastroesophageal reflux disease)   . Gout   . Heart murmur   . History of blood transfusion   . History of bone marrow transplant ([place])   . History of uterine fibroid   . Hx of cardiac catheterization 06/05/2016   Overview:  Normal coronaries 2017  . Hypertension   . Hypomagnesemia   . Multiple myeloma ([place])   . Personal history of chemotherapy   . Renal cyst    Past Surgical History:  Procedure Laterality Date  . ABDOMINAL HYSTERECTOMY    . 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 CARDIAC ELECTROPHYSIOLOGY MAPPING AND ABLATION    . CARPAL TUNNEL RELEASE Bilateral   . CHOLECYSTECTOMY  2008  . COLONOSCOPY WITH PROPOFOL N/A 05/07/2017   Procedure: COLONOSCOPY WITH PROPOFOL;  Surgeon: [name], MD;  Location: [facility] ENDOSCOPY;  Service: Gastroenterology;  Laterality: N/A;  . ESOPHAGOGASTRODUODENOSCOPY (EGD) WITH PROPOFOL N/A 05/07/2017   Procedure: ESOPHAGOGASTRODUODENOSCOPY (EGD) WITH PROPOFOL;  Surgeon: [name], MD;  Location: [facility] ENDOSCOPY;  Service: Gastroenterology;  Laterality: N/A;  . FOOT SURGERY Bilateral   . INCONTINENCE SURGERY  2009  . INTERSTIM IMPLANT PLACEMENT    . other     over active bladder  . OTHER SURGICAL HISTORY     bladder stimulator   . PARTIAL HYSTERECTOMY  03/1996   fibroids  . PORTA CATH INSERTION N/A 03/10/2019   Procedure: PORTA CATH INSERTION;  Surgeon: Algernon Huxley, MD;  Location: [facility]  INVASIVE CV LAB;  Service: Cardiovascular;  Laterality: N/A;  . TONSILLECTOMY  2007     A IV Location/Drains/Wounds Patient Lines/Drains/Airways Status   Active Line/Drains/Airways    Name:   Placement date:   Placement time:   Site:   Days:   Implanted Port 08/11/19   08/11/19    0919    --   94   Peripheral IV 11/13/19 Right Forearm   11/13/19    1932    Forearm   less than 1   External Urinary Catheter   10/15/19    2300    --   29          Intake/Output Last 24 hours No intake or output data in the 24 hours ending 11/13/19  2313  Labs/Imaging Results for orders placed or performed during the hospital encounter of 11/13/19 (from the past 48 hour(s))  Lactic acid, plasma     Status: Abnormal   Collection Time: 11/13/19  6:43 PM  Result Value Ref Range   Lactic Acid, Venous 2.0 (HH) 0.5 - 1.9 mmol/L    Comment: CRITICAL RESULT CALLED TO, READ BACK BY AND VERIFIED WITH REBECCA LYNN @1926  11/13/19 MJU Performed at [facility], [address] [place], [place] [address]   Comprehensive metabolic panel     Status: Abnormal   Collection Time: 11/13/19  6:43 PM  Result Value Ref Range   Sodium 139 135 - 145 mmol/L   Potassium 3.7 3.5 - 5.1 mmol/L   Chloride 107 98 - 111 mmol/L   CO2 23 22 - 32 mmol/L   Glucose, Bld 160 (H) 70 - 99 mg/dL    Comment: Glucose reference range applies only to samples taken after fasting for at least 8 hours.   BUN 15 8 - 23 mg/dL   Creatinine, Ser 1.02 (H) 0.44 - 1.00 mg/dL   Calcium 9.0 8.9 - 10.3 mg/dL   Total Protein 6.3 (L) 6.5 - 8.1 g/dL   Albumin 4.0 3.5 - 5.0 g/dL   AST 33 15 - 41 U/L   ALT 48 (H) 0 - 44 U/L   Alkaline Phosphatase 84 38 - 126 U/L   Total Bilirubin 0.9 0.3 - 1.2 mg/dL   GFR calc non Af Amer 58 (L) >60 mL/min   GFR calc Af Amer >60 >60 mL/min   Anion gap 9 5 - 15    Comment: Performed at [facility], [address] [place],  [address]  CBC WITH DIFFERENTIAL     Status: Abnormal   Collection Time: 11/13/19  6:43 PM  Result Value Ref Range   WBC 4.3 4.0 - 10.5 K/uL   RBC 2.96 (L) 3.87 - 5.11 MIL/uL   Hemoglobin 9.6 (L) 12.0 - 15.0 g/dL   HCT 27.9 (L) 36.0 - 46.0 %   MCV 94.3 80.0 - 100.0 fL   MCH 32.4 26.0 - 34.0 pg   MCHC 34.4 30.0 - 36.0 g/dL   RDW 16.9 (H) 11.5 - 15.5 %   Platelets 123 (L) 150 - 400 K/uL   nRBC 0.0 0.0 - 0.2 %   Neutrophils Relative % 48 %   Neutro Abs 2.0 1.7 - 7.7 K/uL   Lymphocytes Relative 13 %   Lymphs Abs 0.5 (L) 0.7 - 4.0 K/uL   Monocytes Relative 22 %   Monocytes Absolute 1.0 0.1 - 1.0  K/uL   Eosinophils Relative 5 %   Eosinophils Absolute 0.2 0.0 - 0.5 K/uL   Basophils Relative  1 %   Basophils Absolute 0.1 0.0 - 0.1 K/uL   Immature Granulocytes 11 %   Abs Immature Granulocytes 0.48 (H) 0.00 - 0.07 K/uL   Reactive, Benign Lymphocytes PRESENT    Tear Drop Cells PRESENT    Spherocytes PRESENT     Comment: Performed at [facility], [address] [place], [place] [address]  Procalcitonin - Baseline     Status: None   Collection Time: 11/13/19  6:43 PM  Result Value Ref Range   Procalcitonin <0.10 ng/mL    Comment:        Interpretation: PCT (Procalcitonin) <= 0.5 ng/mL: Systemic infection (sepsis) is not likely. Local bacterial infection is possible. (NOTE)       Sepsis PCT Algorithm           Lower Respiratory Tract                                      Infection PCT Algorithm    ----------------------------     ----------------------------         PCT < 0.25 ng/mL                PCT < 0.10 ng/mL         Strongly encourage             Strongly discourage   discontinuation of antibiotics    initiation of antibiotics    ----------------------------     -----------------------------       PCT 0.25 - 0.50 ng/mL            PCT 0.10 - 0.25 ng/mL               OR       >80% decrease in PCT            Discourage initiation of                                            antibiotics      Encourage discontinuation           of antibiotics    ----------------------------     -----------------------------         PCT >= 0.50 ng/mL              PCT 0.26 - 0.50 ng/mL               AND        <80% decrease in PCT             Encourage initiation of                                             antibiotics       Encourage continuation           of antibiotics    ----------------------------     -----------------------------        PCT >= 0.50 ng/mL                  PCT > 0.50 ng/mL               AND  increase in PCT                  Strongly encourage                                       initiation of antibiotics    Strongly encourage escalation           of antibiotics                                     -----------------------------                                           PCT <= 0.25 ng/mL                                                 OR                                        > 80% decrease in PCT                                     Discontinue / Do not initiate                                             antibiotics Performed at [facility], [address] [place], [place] [address]   Lactic acid, plasma     Status: None   Collection Time: 11/13/19  7:41 PM  Result Value Ref Range   Lactic Acid, Venous 1.3 0.5 - 1.9 mmol/L    Comment: Performed at [facility], [address] [place], [place] [address]  Blood gas, venous ([facility], [facility], [facility])     Status: Abnormal   Collection Time: 11/13/19  7:41 PM  Result Value Ref Range   pH, Ven 7.42 7.250 - 7.430   pCO2, Ven 36 (L) 44.0 - 60.0 mmHg   pO2, Ven 74.0 (H) 32.0 - 45.0 mmHg   Bicarbonate 23.4 20.0 - 28.0 mmol/L   Acid-base deficit 0.7 0.0 - 2.0 mmol/L   O2 Saturation 95.0 %   Patient temperature 37.0    Collection site LINE    Sample type VENOUS     Comment: Performed at [facility], [address], [place], [place] [address]  SARS Coronavirus 2 by RT PCR (hospital order, performed in [facility] hospital lab) Nasopharyngeal Nasopharyngeal Swab     Status: None   Collection Time: 11/13/19  7:41 PM   Specimen: Nasopharyngeal Swab  Result Value Ref Range   SARS Coronavirus 2 NEGATIVE NEGATIVE    Comment: (NOTE) SARS-CoV-2 target nucleic acids are NOT DETECTED. The SARS-CoV-2 RNA is generally detectable in upper and lower respiratory specimens during the acute phase of infection. The lowest concentration of SARS-CoV-2  viral copies this assay can detect is 250 copies / mL. A negative result does not preclude SARS-CoV-2 infection and should not be  used as the sole basis for treatment or other patient management decisions.  A negative result may occur with improper specimen collection / handling, submission of specimen other than nasopharyngeal swab, presence of viral mutation(s) within the areas targeted by this assay, and inadequate number of viral copies (<250 copies / mL). A negative result must be combined with clinical observations, patient history, and epidemiological information. Fact Sheet for Patients:   StrictlyIdeas.no Fact Sheet for Healthcare Providers: BankingDealers.co.za This test is not yet approved or cleared  by the [place] FDA and has been authorized for detection and/or diagnosis of SARS-CoV-2 by FDA under an Emergency Use Authorization (EUA).  This EUA will remain in effect (meaning this test can be used) for the duration of the COVID-19 declaration under Section 564(b)(1) of the Act, 21 U.S.C. section 360bbb-3(b)(1), unless the authorization is terminated or revoked sooner. Performed at [facility], [address] [place], [place] [address]   Glucose, capillary     Status: Abnormal   Collection Time: 11/13/19 10:06 PM  Result Value Ref Range   Glucose-Capillary 128 (H) 70 - 99 mg/dL    Comment: Glucose reference range applies only to samples taken after fasting for at least 8 hours.   DG Chest Port 1 View  Result Date: 11/13/2019 CLINICAL DATA:  Fever EXAM: PORTABLE CHEST 1 VIEW COMPARISON:  10/15/2019 FINDINGS: Right-sided chest port terminates at the level of the superior cavoatrial junction. Normal heart size. Streaky right infrahilar airspace opacity. Left lung appears clear. No pleural effusion or pneumothorax. IMPRESSION: Streaky right infrahilar airspace opacity suspicious for pneumonia. Electronically Signed   By: Davina Poke D.O.   On: 11/13/2019 19:04    Pending Labs Unresulted Labs (From admission, onward)    Start      Ordered   11/20/19 0500  Creatinine, serum  (enoxaparin (LOVENOX)    CrCl >/= 30 ml/min)  Weekly,   STAT    Comments: while on enoxaparin therapy    11/13/19 2101   11/14/19 0500  Protime-INR  Tomorrow morning,   STAT     11/13/19 2101   11/14/19 0500  Cortisol-am, blood  Tomorrow morning,   STAT     11/13/19 2101   11/14/19 0500  Procalcitonin  Tomorrow morning,   STAT     11/13/19 2101   11/14/19 0500  CBC  Tomorrow morning,   STAT     11/13/19 2101   11/14/19 1017  Basic metabolic panel  Tomorrow morning,   STAT     11/13/19 2101   11/13/19 2120  Influenza panel by PCR (type A & B)  (Influenza PCR Panel)  Once,   STAT     11/13/19 2119   11/13/19 1836  Blood Culture (routine x 2)  BLOOD CULTURE X 2,   STAT     11/13/19 1835   11/13/19 1836  Urine culture  ONCE - STAT,   STAT     11/13/19 1835   11/13/19 1836  Urinalysis, Complete w Microscopic  ONCE - STAT,   STAT     11/13/19 1835          Vitals/Pain Today's Vitals   11/13/19 2130 11/13/19 2200 11/13/19 2230 11/13/19 2259  BP: (!) 110/53 112/61 (!) 113/59   Pulse: 86 87    Resp:      Temp:  99 F (37.2 C)  TempSrc:    Oral  SpO2: 97% 99%    Weight:      Height:      PainSc:        Isolation Precautions Droplet precaution  Medications Medications  insulin aspart (novoLOG) injection 0-15 Units (has no administration in time range)  insulin aspart (novoLOG) injection 0-5 Units (0 Units Subcutaneous Not Given 11/13/19 2209)  enoxaparin (LOVENOX) injection 40 mg (40 mg Subcutaneous Given 11/13/19 2210)  sodium chloride 0.9 % bolus 1,000 mL (1,000 mLs Intravenous New Bag/Given 11/13/19 2210)  0.9 %  sodium chloride infusion ( Intravenous New Bag/Given 11/13/19 2255)  ceFEPIme (MAXIPIME) 2 g in sodium chloride 0.9 % 100 mL IVPB (has no administration in time range)  acetaminophen (TYLENOL) tablet 650 mg (has no administration in time range)    Or  acetaminophen (TYLENOL) suppository 650 mg (has no administration in  time range)  promethazine (PHENERGAN) tablet 12.5 mg (has no administration in time range)  vancomycin (VANCOREADY) IVPB 1250 mg/250 mL (has no administration in time range)  vancomycin (VANCOCIN) IVPB 1000 mg/200 mL premix (0 mg Intravenous Stopped 11/13/19 2102)  ceFEPIme (MAXIPIME) 2 g in sodium chloride 0.9 % 100 mL IVPB (0 g Intravenous Stopped 11/13/19 2045)  0.9 %  sodium chloride infusion ( Intravenous Stopped 11/13/19 2211)  levofloxacin (LEVAQUIN) IVPB 750 mg (0 mg Intravenous Stopped 11/13/19 2249)    Mobility walks Low fall risk   Focused Assessments Cardiac Assessment Handoff:    Lab Results  Component Value Date   TROPONINI <0.03 07/25/2017   No results found for: DDIMER Does the Patient currently have chest pain? No  , Pulmonary Assessment Handoff:  Lung sounds:   O2 Device: Room Air        R Recommendations: See Admitting Provider Note  Report given to:   Additional Notes:

## 2019-11-13 NOTE — ED Notes (Signed)
Pt reports sore throat and cough throughout cancer treatments, but reports worsening today.

## 2019-11-13 NOTE — ED Notes (Signed)
Lab called about missing blood cultures. Blood cultures x2 drawn, labelled, and sent via triage tube system to lab at approx 1930.   Per lab, they have not reeceived

## 2019-11-13 NOTE — ED Notes (Signed)
Olen Cordial, Rn given report

## 2019-11-13 NOTE — ED Provider Notes (Signed)
[facility] Emergency Department Provider Note       Time seen: ----------------------------------------- 6:27 PM on 11/13/2019 -----------------------------------------   I have reviewed the vital signs and the nursing notes.  HISTORY   Chief Complaint Fever and Cough    HPI Sarah [name] is a 64 y.o. female with a history of anemia, anxiety, arthritis, CHF, CKD, diabetes, GERD, multiple myeloma who presents today for fever with congestion and sore throat.  Patient states she feels like she has phlegm that she needs to cough up but is unable to do so.  Last took chemotherapy on Wednesday.  Describes 10 out of 10 pain in her throat.  Had neutropenic fevers recently.  Past Medical History:  Diagnosis Date  . Abnormal stress test 02/14/2016   Overview:  Added automatically from request for surgery [id number]  . Anemia   . Anxiety   . Arthritis   . Bicuspid aortic valve   . CHF (congestive heart failure) ([place])   . CKD (chronic kidney disease) stage 3, GFR 30-59 ml/min   . Depression   . Diabetes mellitus ([place])   . Dizziness   . Fatty liver   . Frequent falls   . GERD (gastroesophageal reflux disease)   . Gout   . Heart murmur   . History of blood transfusion   . History of bone marrow transplant ([place])   . History of uterine fibroid   . Hx of cardiac catheterization 06/05/2016   Overview:  Normal coronaries 2017  . Hypertension   . Hypomagnesemia   . Multiple myeloma ([place])   . Personal history of chemotherapy   . Renal cyst     Past Surgical History:  Procedure Laterality Date  . ABDOMINAL HYSTERECTOMY    . 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 CARDIAC ELECTROPHYSIOLOGY MAPPING AND ABLATION    . CARPAL TUNNEL RELEASE Bilateral   . CHOLECYSTECTOMY  2008  . COLONOSCOPY WITH PROPOFOL N/A 05/07/2017   Procedure: COLONOSCOPY WITH PROPOFOL;  Surgeon: [name], MD;  Location: [facility] ENDOSCOPY;  Service: Gastroenterology;  Laterality: N/A;  .  ESOPHAGOGASTRODUODENOSCOPY (EGD) WITH PROPOFOL N/A 05/07/2017   Procedure: ESOPHAGOGASTRODUODENOSCOPY (EGD) WITH PROPOFOL;  Surgeon: [name], MD;  Location: [facility] ENDOSCOPY;  Service: Gastroenterology;  Laterality: N/A;  . FOOT SURGERY Bilateral   . INCONTINENCE SURGERY  2009  . INTERSTIM IMPLANT PLACEMENT    . other     over active bladder  . OTHER SURGICAL HISTORY     bladder stimulator   . PARTIAL HYSTERECTOMY  03/1996   fibroids  . PORTA CATH INSERTION N/A 03/10/2019   Procedure: PORTA CATH INSERTION;  Surgeon: Algernon Huxley, MD;  Location: [place] CV LAB;  Service: Cardiovascular;  Laterality: N/A;  . TONSILLECTOMY  2007    Allergies Oxycodone-acetaminophen, Celebrex [celecoxib], Codeine, Benadryl [diphenhydramine], Morphine, Ondansetron, and Tylenol [acetaminophen]   Review of Systems Constitutional: Positive for fever HEENT: Positive for sore throat Cardiovascular: Negative for chest pain. Respiratory: Positive for cough Gastrointestinal: Negative for abdominal pain, vomiting and diarrhea. Musculoskeletal: Negative for back pain. Skin: Negative for rash. Neurological: Negative for headaches, focal weakness or numbness.  All systems negative/normal/unremarkable except as stated in the HPI  ____________________________________________   PHYSICAL EXAM:  VITAL SIGNS: ED Triage Vitals  Enc Vitals Group     BP 11/13/19 1817 139/83     Pulse Rate 11/13/19 1817 (!) 111     Resp 11/13/19 1817 18     Temp 11/13/19 1817 (!) 103 F (39.4 C)  Temp Source 11/13/19 1817 Oral     SpO2 11/13/19 1817 97 %     Weight 11/13/19 1821 146 lb 6.2 oz (66.4 kg)     Height 11/13/19 1821 5' 2"  (1.575 m)     Head Circumference --      Peak Flow --      Pain Score 11/13/19 1821 10     Pain Loc --      Pain Edu? --      Excl. in [place]? --    Constitutional: Alert and oriented. Well appearing and in no distress. Eyes: Conjunctivae are normal. Normal extraocular movements. ENT       Head: Normocephalic and atraumatic.      Nose: No congestion/rhinnorhea.      Mouth/Throat: Mucous membranes are moist.  No obvious erythema      Neck: No stridor. Cardiovascular: Rapid rate, regular rhythm. No murmurs, rubs, or gallops. Respiratory: Normal respiratory effort without tachypnea nor retractions. Breath sounds are clear and equal bilaterally. No wheezes/rales/rhonchi. Gastrointestinal: Soft and nontender. Normal bowel sounds Musculoskeletal: Nontender with normal range of motion in extremities. No lower extremity tenderness nor edema. Neurologic:  Normal speech and language. No gross focal neurologic deficits are appreciated.  Skin:  Skin is warm, dry and intact. No rash noted. Psychiatric: Speech and behavior are normal.  ____________________________________________  EKG: Interpreted by me.  Sinus tachycardia rate of 130 bpm, PVCs, normal axis  ____________________________________________   LABS (pertinent positives/negatives)  Labs Reviewed  LACTIC ACID, PLASMA - Abnormal; Notable for the following components:      Result Value   Lactic Acid, Venous 2.0 (*)    All other components within normal limits  COMPREHENSIVE METABOLIC PANEL - Abnormal; Notable for the following components:   Glucose, Bld 160 (*)    Creatinine, Ser 1.02 (*)    Total Protein 6.3 (*)    ALT 48 (*)    GFR calc non Af Amer 58 (*)    All other components within normal limits  CBC WITH DIFFERENTIAL/PLATELET - Abnormal; Notable for the following components:   RBC 2.96 (*)    Hemoglobin 9.6 (*)    HCT 27.9 (*)    RDW 16.9 (*)    Platelets 123 (*)    Lymphs Abs 0.5 (*)    Abs Immature Granulocytes 0.48 (*)    All other components within normal limits  BLOOD GAS, VENOUS - Abnormal; Notable for the following components:   pCO2, Ven 36 (*)    pO2, Ven 74.0 (*)    All other components within normal limits  GLUCOSE, CAPILLARY - Abnormal; Notable for the following components:    Glucose-Capillary 128 (*)    All other components within normal limits  SARS CORONAVIRUS 2 BY RT PCR (HOSPITAL ORDER, [facility] LAB)  CULTURE, BLOOD (ROUTINE X 2)  CULTURE, BLOOD (ROUTINE X 2)  URINE CULTURE  LACTIC ACID, PLASMA  PROCALCITONIN  URINALYSIS, COMPLETE (UACMP) WITH MICROSCOPIC  PROTIME-INR  CORTISOL-AM, BLOOD  PROCALCITONIN  CBC  BASIC METABOLIC PANEL  INFLUENZA PANEL BY PCR (TYPE A & B)    CRITICAL CARE Performed by: Laurence Aly   Total critical care time: 30 minutes  Critical care time was exclusive of separately billable procedures and treating other patients.  Critical care was necessary to treat or prevent imminent or life-threatening deterioration.  Critical care was time spent personally by me on the following activities: development of treatment plan with patient and/or surrogate as well as  nursing, discussions with consultants, evaluation of patient's response to treatment, examination of patient, obtaining history from patient or surrogate, ordering and performing treatments and interventions, ordering and review of laboratory studies, ordering and review of radiographic studies, pulse oximetry and re-evaluation of patient's condition.  RADIOLOGY Images viewed by me CXR IMPRESSION: Streaky right infrahilar airspace opacity suspicious for pneumonia.   DIFFERENTIAL DIAGNOSIS Sepsis, dehydration, electrolyte abnormality, neutropenia, COVID-19   ASSESSMENT AND PLAN  Healthcare associated pneumonia   Plan: The patient had presented for likely sepsis. Patient's labs initially indicated lactic acidosis but otherwise was reassuring.  Imaging was concerning for right infrahilar airspace opacity concerning for pneumonia.  She was given broad-spectrum antibiotics given her history.  Have discussed with oncology who recommends hospital admission.  I will discuss with the hospitalist service.  Lenise Arena MD    Note:  This note was generated in part or whole with voice recognition software. Voice recognition is usually quite accurate but there are transcription errors that can and very often do occur. I apologize for any typographical errors that were not detected and corrected.     Earleen Newport, MD 11/13/19 [phone]

## 2019-11-13 NOTE — ED Triage Notes (Signed)
Pt to ED via POV, pt states that she has hx/o multiple myeloma. Pt states that she started running fever today. Pt has been trying to call the cancer center but unable to get in touch with anyone. Pt reports having congestion and sore throat as well. Pt states that she feels like there is phlegm trying to come up but she is unable to get it out. Last took chemo pill on Wednesday. Pt has port accessed currently.

## 2019-11-13 NOTE — Progress Notes (Signed)
CODE SEPSIS - PHARMACY COMMUNICATION  **Broad Spectrum Antibiotics should be administered within 1 hour of Sepsis diagnosis**  Time Code Sepsis Called/Page Received: 1841  Antibiotics Ordered: 1934  Time of 1st antibiotic administration: Vancomycin, Cefepime  Additional action taken by pharmacy: none  If necessary, Name of Provider/Nurse Contacted: n/a    Pearla Dubonnet ,PharmD Clinical Pharmacist  11/13/2019  7:43 PM

## 2019-11-13 NOTE — Consult Note (Signed)
Pharmacy Antibiotic Note  Sarah [name] is a 64 y.o. female admitted on 11/13/2019 with pneumonia.  Pharmacy has been consulted for Vancomycin and Cefepime dosing.  Plan: 1) Vancomycin 1250mg  Q24 hours per [place] [facility]  2) Cefepime 2g Q12 hours  Height: 5\' 2"  (157.5 cm) Weight: 66.4 kg (146 lb 6.2 oz) IBW/kg (Calculated) : 50.1  Temp (24hrs), Avg:101.4 F (38.6 C), Min:99.7 F (37.6 C), Max:103 F (39.4 C)  Recent Labs  Lab 11/08/19 0911 11/11/19 0902 11/12/19 0846 11/13/19 1843 11/13/19 1941  WBC 3.5* 2.9* 4.5 4.3  --   CREATININE 0.94  --  1.01* 1.02*  --   LATICACIDVEN  --   --   --  2.0* 1.3    Estimated Creatinine Clearance: 50.4 mL/min (A) (by C-G formula based on SCr of 1.02 mg/dL (H)).    Allergies  Allergen Reactions  . Oxycodone-Acetaminophen Anaphylaxis    Swelling and rash  . Celebrex [Celecoxib] Diarrhea  . Codeine   . Benadryl [Diphenhydramine] Palpitations  . Morphine Itching and Rash  . Ondansetron Diarrhea  . Tylenol [Acetaminophen] Itching and Rash    Antimicrobials this admission: Vanc/Cefepime 5/15 >>  Levofloxacin 5/15 x1  Microbiology results: 5/15 BCx: pending 5/15 UCx: pending   Thank you for allowing pharmacy to be a part of this patient's care.  Pearla Dubonnet 11/13/2019 9:13 PM

## 2019-11-13 NOTE — H&P (Signed)
History and Physical    Sarah [name] DSK:[id number] DOB: [birth year] DOA: 11/13/2019  PCP: Glean Hess, MD   Patient coming from: Home  I have personally briefly reviewed patient's old medical records in [place]  Chief Complaint: Fever, cough, sore throat  HPI: Sarah [name] is a 64 y.o. female with medical history significant for multiple myeloma with history of autologous stem cell transplant 2016, currently on chemotherapy with daratumumab, Pomalyst and dexamethasone, as well as history of HTN, DM, CKD 3 and mild aortic stenosis, with recent hospitalization in April 2021 for neutropenic fever, treated with broad-spectrum antibiotics but with no source identified, who presents to the emergency room with a several day history of sore throat, cough, generalized weakness, nausea and now with fever starting on the day of arrival.  She saw her primary oncologist on 11/08/2019 at which time she was not having fevers and had blood work done which were mostly unremarkable except for a slightly low potassium level of 3.3.  She denies vomiting, abdominal pain or change in bowel habits  ED Course: On arrival she was febrile at 103, tachycardic at 111.  WBC 4.3 and lactic acid 2, improving to 1.3 after initial hydration.  Labs otherwise unremarkable except for stable anemia of 9.6 and stable thrombocytopenia of 123.  Covid PCR pending.  Chest x-ray showed streaky right infrahilar airspace opacity suspicious for pneumonia.  Patient started on cefepime and vancomycin.  Hospitalist consulted for admission  Review of Systems: As per HPI otherwise 10 point review of systems negative.    Past Medical History:  Diagnosis Date  . Abnormal stress test 02/14/2016   Overview:  Added automatically from request for surgery [id number]  . Anemia   . Anxiety   . Arthritis   . Bicuspid aortic valve   . CHF (congestive heart failure) ([place])   . CKD (chronic kidney disease) stage 3, GFR 30-59 ml/min   .  Depression   . Diabetes mellitus ([place])   . Dizziness   . Fatty liver   . Frequent falls   . GERD (gastroesophageal reflux disease)   . Gout   . Heart murmur   . History of blood transfusion   . History of bone marrow transplant ([place])   . History of uterine fibroid   . Hx of cardiac catheterization 06/05/2016   Overview:  Normal coronaries 2017  . Hypertension   . Hypomagnesemia   . Multiple myeloma ([place])   . Personal history of chemotherapy   . Renal cyst     Past Surgical History:  Procedure Laterality Date  . ABDOMINAL HYSTERECTOMY    . 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 CARDIAC ELECTROPHYSIOLOGY MAPPING AND ABLATION    . CARPAL TUNNEL RELEASE Bilateral   . CHOLECYSTECTOMY  2008  . COLONOSCOPY WITH PROPOFOL N/A 05/07/2017   Procedure: COLONOSCOPY WITH PROPOFOL;  Surgeon: [name], MD;  Location: [facility] ENDOSCOPY;  Service: Gastroenterology;  Laterality: N/A;  . ESOPHAGOGASTRODUODENOSCOPY (EGD) WITH PROPOFOL N/A 05/07/2017   Procedure: ESOPHAGOGASTRODUODENOSCOPY (EGD) WITH PROPOFOL;  Surgeon: [name], MD;  Location: [facility] ENDOSCOPY;  Service: Gastroenterology;  Laterality: N/A;  . FOOT SURGERY Bilateral   . INCONTINENCE SURGERY  2009  . INTERSTIM IMPLANT PLACEMENT    . other     over active bladder  . OTHER SURGICAL HISTORY     bladder stimulator   . PARTIAL HYSTERECTOMY  03/1996   fibroids  . PORTA CATH INSERTION N/A 03/10/2019   Procedure: PORTA CATH INSERTION;  Surgeon: Algernon Huxley, MD;  Location: [facility] LAB;  Service: Cardiovascular;  Laterality: N/A;  . TONSILLECTOMY  2007     reports that she quit smoking about 28 years ago. Her smoking use included cigarettes. She has a 20.00 pack-year smoking history. She has never used smokeless tobacco. She reports previous alcohol use. She reports that she does not use drugs.  Allergies  Allergen Reactions  . Oxycodone-Acetaminophen Anaphylaxis    Swelling and rash  . Celebrex [Celecoxib] Diarrhea  .  Codeine   . Benadryl [Diphenhydramine] Palpitations  . Morphine Itching and Rash  . Ondansetron Diarrhea  . Tylenol [Acetaminophen] Itching and Rash    Family History  Problem Relation Age of Onset  . Colon cancer Father   . Renal Disease Father   . Diabetes Mellitus II Father   . Melanoma Paternal Grandmother   . Breast cancer Maternal Aunt 48  . Anemia Mother   . Heart disease Mother   . Heart failure Mother   . Renal Disease Mother   . Congestive Heart Failure Mother   . Heart disease Maternal Uncle   . Throat cancer Maternal Uncle   . Lung cancer Maternal Uncle   . Liver disease Maternal Uncle   . Heart failure Maternal Uncle   . Hearing loss Son 37       Suicide      Prior to Admission medications   Medication Sig Start Date End Date Taking? Authorizing Provider  acetaminophen (TYLENOL) 500 MG tablet Take 500 mg by mouth every 6 (six) hours as needed.    [provider]  acyclovir (ZOVIRAX) 400 MG tablet Take 1 tablet (400 mg total) by mouth 2 (two) times daily. 09/23/19   Lequita Asal, MD  allopurinol (ZYLOPRIM) 100 MG tablet TAKE 1 TABLET(100 MG) BY MOUTH DAILY as needed 12/02/18   Glean Hess, MD  aspirin 81 MG chewable tablet Chew 1 tablet (81 mg total) by mouth daily. 07/27/17   Gladstone Lighter, MD  dexamethasone (DECADRON) 4 MG tablet Take 5 tablets (35m) on days 2, 9, 16, 23 of cycle 1 and 2 Take 5 tablets (276m on days 2, 16 of cycle 3,4,5 and 6 Take 5 tablets (2074mon days 2 of cycles 7 and beyond Patient not taking: Reported on 11/08/2019 09/27/19   CorLequita AsalD  diclofenac sodium (VOLTAREN) 1 % GEL Apply 2 g topically 4 (four) times daily. 01/25/19   BenGertie BaronP  diphenhydrAMINE (BENADRYL) 50 MG capsule Take 50 mg by mouth as needed.    [provider]  diphenoxylate-atropine (LOMOTIL) 2.5-0.025 MG tablet Take 2 tablets by mouth daily as needed for diarrhea or loose stools. Patient not taking: Reported on  11/08/2019 09/23/19   CorLequita AsalD  fentaNYL (DURAGESIC - DOSED MCG/HR) 25 MCG/HR patch Place 25 mcg onto the skin every 3 (three) days.  08/12/16   [provider]  fexofenadine (ALLEGRA) 180 MG tablet TAKE 1 TABLET(180 MG) BY MOUTH DAILY 05/30/17   BerGlean HessD  fluconazole (DIFLUCAN) 100 MG tablet  10/27/19   [provider]  FLUoxetine (PROZAC) 40 MG capsule TAKE 1 CAPSULE EVERY DAY 09/30/19   BerGlean HessD  fluticasone (FLAlleghen[facility] Hospital0 MCG/ACT nasal spray Place 2 sprays into both nostrils daily. 07/10/18   BerGlean HessD  furosemide (LASIX) 20 MG tablet Take 20 mg by mouth as needed.  12/09/18   [provider]  glucose blood (ONE TOUCH  ULTRA TEST) test strip  09/04/15   [provider]  hydrOXYzine (ATARAX/VISTARIL) 10 MG tablet TAKE 1 TABLET(10 MG) BY MOUTH THREE TIMES DAILY AS NEEDED 05/30/17   Glean Hess, MD  lactulose [facility]) 10 GM/15ML solution  10/09/19   [provider]  lidocaine-prilocaine (EMLA) cream APPLY TOPICALLY AS NEEDED. Patient not taking: Reported on 11/08/2019 04/07/19   Lequita Asal, MD  Magnesium Bisglycinate (MAG GLYCINATE) 100 MG TABS Take 100 mg by mouth 2 (two) times daily. 08/20/18   Karen Kitchens, NP  metFORMIN (GLUCOPHAGE-XR) 500 MG 24 hr tablet Take 1 tablet (500 mg total) by mouth daily with breakfast. 12/02/18   Glean Hess, MD  Misc Natural Products (OSTEO BI-FLEX ADV TRIPLE ST PO) Take 2 tablets by mouth daily.    [provider]  montelukast (SINGULAIR) 10 MG tablet Take 1 tablet by mouth on the day before treatment and for 2 days after treatment. Patient not taking: Reported on 11/12/2019 09/23/19   Lequita Asal, MD  Multiple Vitamins-Minerals (HAIR/SKIN/NAILS/BIOTIN PO) Take 1 capsule by mouth 3 (three) times daily.    [provider]  NARCAN 4 MG/0.1ML LIQD nasal spray kit 1 spray. 04/21/19   [provider]  Omega 3-6-9 Fatty Acids ([place]  3-6-9 COMPLEX) CAPS Take by mouth.    [provider]  omeprazole (PRILOSEC) 40 MG capsule Take 1 capsule (40 mg total) by mouth daily. 12/11/18   Glean Hess, MD  pomalidomide (POMALYST) 3 MG capsule Take 1 capsule (3 mg total) by mouth daily. [employer] Auth #  [id number]      Date Obtained 10/19/2019 Patient not taking: Reported on 11/12/2019 10/19/19   Lequita Asal, MD  pravastatin (PRAVACHOL) 20 MG tablet TAKE 1 TABLET EVERY DAY Patient not taking: Reported on 11/12/2019 09/30/19   Glean Hess, MD  promethazine (PHENERGAN) 25 MG tablet Take 1 tablet (25 mg total) by mouth every 6 (six) hours as needed for nausea or vomiting. Patient not taking: Reported on 11/12/2019 11/11/19   Lequita Asal, MD  promethazine-dextromethorphan (PROMETHAZINE-DM) 6.25-15 MG/5ML syrup Take 5 mLs by mouth 4 (four) times daily as needed for cough. Patient not taking: Reported on 10/29/2019 09/08/19   Glean Hess, MD  tiZANidine (ZANAFLEX) 4 MG tablet Take 1 tablet (4 mg total) by mouth every 8 (eight) hours as needed for muscle spasms. 12/02/18   Glean Hess, MD  traZODone (DESYREL) 100 MG tablet TAKE 1 TABLET AT BEDTIME  FOR  SLEEP 09/30/19   Glean Hess, MD    Physical Exam: Vitals:   11/13/19 1817 11/13/19 1821 11/13/19 1900  BP: 139/83  128/65  Pulse: (!) 111    Resp: 18    Temp: (!) 103 F (39.4 C)    TempSrc: Oral    SpO2: 97%    Weight:  66.4 kg   Height:  5' 2"  (1.575 m)      Vitals:   11/13/19 1817 11/13/19 1821 11/13/19 1900  BP: 139/83  128/65  Pulse: (!) 111    Resp: 18    Temp: (!) 103 F (39.4 C)    TempSrc: Oral    SpO2: 97%    Weight:  66.4 kg   Height:  5' 2"  (1.575 m)     Constitutional: Alert and awake, oriented x3, not in any acute distress. Eyes: PERLA, EOMI, irises appear normal, anicteric sclera,  ENMT: external ears and nose appear normal, normal hearing  Neck: neck appears  normal, no masses, normal ROM, no thyromegaly, no JVD  CVS:  S1-S2 clear, no murmur rubs or gallops,  , no carotid bruits, pedal pulses palpable, No LE edema Respiratory:  clear to auscultation bilaterally, no wheezing, rales or rhonchi. Respiratory effort normal. No accessory muscle use.  Abdomen: soft nontender, nondistended, normal bowel sounds, no hepatosplenomegaly, no hernias Musculoskeletal: : no cyanosis, clubbing , no contractures or atrophy Neuro: Cranial nerves II-XII intact, sensation, reflexes normal, strength Psych: judgement and insight appear normal, stable mood and affect,  Skin: no rashes or lesions or ulcers, no induration or nodules   Labs on Admission: I have personally reviewed following labs and imaging studies  CBC: Recent Labs  Lab 11/08/19 0911 11/11/19 0902 11/12/19 0846 11/13/19 1843  WBC 3.5* 2.9* 4.5 4.3  NEUTROABS 2.2 1.0* 2.2 2.0  HGB 9.6* 10.4* 9.9* 9.6*  HCT 27.7* 29.9* 28.8* 27.9*  MCV 91.7 92.0 93.2 94.3  PLT 90* 102* 108* 517*   Basic Metabolic Panel: Recent Labs  Lab 11/08/19 0911 11/11/19 0902 11/12/19 0846 11/13/19 1843  NA 137  --  134* 139  K 4.0  --  3.3* 3.7  CL 104  --  101 107  CO2 23  --  22 23  GLUCOSE 148*  --  146* 160*  BUN 17  --  23 15  CREATININE 0.94  --  1.01* 1.02*  CALCIUM 8.9  --  8.7* 9.0  MG 1.1* 1.2*  --   --    GFR: Estimated Creatinine Clearance: 50.4 mL/min (A) (by C-G formula based on SCr of 1.02 mg/dL (H)). Liver Function Tests: Recent Labs  Lab 11/08/19 0911 11/13/19 1843  AST 24 33  ALT 29 48*  ALKPHOS 76 84  BILITOT 0.6 0.9  PROT 6.8 6.3*  ALBUMIN 4.2 4.0   No results for input(s): LIPASE, AMYLASE in the last 168 hours. No results for input(s): AMMONIA in the last 168 hours. Coagulation Profile: No results for input(s): INR, PROTIME in the last 168 hours. Cardiac Enzymes: No results for input(s): CKTOTAL, CKMB, CKMBINDEX, TROPONINI in the last 168 hours. BNP (last 3 results) No results for input(s): PROBNP in the last 8760 hours. HbA1C: No  results for input(s): HGBA1C in the last 72 hours. CBG: No results for input(s): GLUCAP in the last 168 hours. Lipid Profile: No results for input(s): CHOL, HDL, LDLCALC, TRIG, CHOLHDL, LDLDIRECT in the last 72 hours. Thyroid Function Tests: No results for input(s): TSH, T4TOTAL, FREET4, T3FREE, THYROIDAB in the last 72 hours. Anemia Panel: No results for input(s): VITAMINB12, FOLATE, FERRITIN, TIBC, IRON, RETICCTPCT in the last 72 hours. Urine analysis:    Component Value Date/Time   COLORURINE YELLOW (A) 10/16/2019 0052   APPEARANCEUR CLEAR (A) 10/16/2019 0052   APPEARANCEUR Clear 07/13/2018 0835   LABSPEC 1.011 10/16/2019 0052   PHURINE 5.0 10/16/2019 0052   GLUCOSEU NEGATIVE 10/16/2019 0052   HGBUR NEGATIVE 10/16/2019 0052   BILIRUBINUR NEGATIVE 10/16/2019 0052   BILIRUBINUR neg 11/25/2018 1021   BILIRUBINUR Negative 07/13/2018 0835   KETONESUR NEGATIVE 10/16/2019 0052   PROTEINUR NEGATIVE 10/16/2019 0052   UROBILINOGEN 0.2 11/25/2018 1021   NITRITE NEGATIVE 10/16/2019 0052   LEUKOCYTESUR NEGATIVE 10/16/2019 0052    Radiological Exams on Admission: DG Chest Port 1 View  Result Date: 11/13/2019 CLINICAL DATA:  Fever EXAM: PORTABLE CHEST 1 VIEW COMPARISON:  10/15/2019 FINDINGS: Right-sided chest port terminates at the level of the superior cavoatrial junction. Normal heart size. Streaky right infrahilar airspace opacity. Left lung  appears clear. No pleural effusion or pneumothorax. IMPRESSION: Streaky right infrahilar airspace opacity suspicious for pneumonia. Electronically Signed   By: Davina Poke D.O.   On: 11/13/2019 19:04    EKG: Independently reviewed.   Assessment/Plan Principal Problem:   Sepsis ([place])   Pneumonia,  HCAP -Patient presents with fever of 103, starting on the day of arrival, preceded by a several day history of sore throat, nausea and generalized malaise with chest x-ray showing likely pneumonia. -Recently hospitalized in April 2021 with  neutropenic fever treated with broad-spectrum antibiotics, evaluated by ID at the time -Sepsis criteria included fever, tachycardia, lactic acid 2 with pneumonia on chest x-ray -IV fluid bolus followed by continued hydration  -IV cefepime and vancomycin, -Follow procalcitonin -Follow Covid PCR and flu    Multiple myeloma not having achieved remission ([place]) -Follows outpatient with Dr. Mike Gip, last seen 11/08/2019 -On chemotherapy    Stage 3 chronic kidney disease -At baseline at 1.02    Type II diabetes mellitus with complication ([place]) -Sliding scale insulin coverage pending med rec  History of a flutter status post ablation 1999 History of moderate aortic stenosis -No acute concerns -Monitor for fluid overload with hydration.  EF 50% in outpatient 04/2019 -Follows with Dr. Nehemiah Massed in the outpatient  DVT prophylaxis: Lovenox  Code Status: full code  Family Communication:  none  Disposition Plan: Back to previous home environment Consults called: none  Status:obs    Athena Masse MD [facility]     11/13/2019, 9:03 PM

## 2019-11-13 NOTE — Progress Notes (Signed)
Communication took place due to concern over collection of [facility] but  bedside RN Olen Cordial who confirms that blood cultures were drawn at 7:20pm, Elink not able to see this at the time

## 2019-11-13 NOTE — ED Notes (Signed)
Critical Lactic acid result told to Jimmye Norman, MD.

## 2019-11-14 ENCOUNTER — Encounter: Payer: Self-pay | Admitting: Internal Medicine

## 2019-11-14 DIAGNOSIS — N1831 Chronic kidney disease, stage 3a: Secondary | ICD-10-CM | POA: Diagnosis present

## 2019-11-14 DIAGNOSIS — F419 Anxiety disorder, unspecified: Secondary | ICD-10-CM | POA: Diagnosis present

## 2019-11-14 DIAGNOSIS — Z90711 Acquired absence of uterus with remaining cervical stump: Secondary | ICD-10-CM | POA: Diagnosis not present

## 2019-11-14 DIAGNOSIS — C9000 Multiple myeloma not having achieved remission: Secondary | ICD-10-CM | POA: Diagnosis present

## 2019-11-14 DIAGNOSIS — E872 Acidosis: Secondary | ICD-10-CM | POA: Diagnosis present

## 2019-11-14 DIAGNOSIS — E1122 Type 2 diabetes mellitus with diabetic chronic kidney disease: Secondary | ICD-10-CM | POA: Diagnosis present

## 2019-11-14 DIAGNOSIS — F329 Major depressive disorder, single episode, unspecified: Secondary | ICD-10-CM | POA: Diagnosis present

## 2019-11-14 DIAGNOSIS — D709 Neutropenia, unspecified: Secondary | ICD-10-CM | POA: Diagnosis present

## 2019-11-14 DIAGNOSIS — Q231 Congenital insufficiency of aortic valve: Secondary | ICD-10-CM | POA: Diagnosis not present

## 2019-11-14 DIAGNOSIS — Z9481 Bone marrow transplant status: Secondary | ICD-10-CM | POA: Diagnosis not present

## 2019-11-14 DIAGNOSIS — I5032 Chronic diastolic (congestive) heart failure: Secondary | ICD-10-CM | POA: Diagnosis present

## 2019-11-14 DIAGNOSIS — I350 Nonrheumatic aortic (valve) stenosis: Secondary | ICD-10-CM | POA: Diagnosis not present

## 2019-11-14 DIAGNOSIS — I130 Hypertensive heart and chronic kidney disease with heart failure and stage 1 through stage 4 chronic kidney disease, or unspecified chronic kidney disease: Secondary | ICD-10-CM | POA: Diagnosis present

## 2019-11-14 DIAGNOSIS — Z9221 Personal history of antineoplastic chemotherapy: Secondary | ICD-10-CM | POA: Diagnosis not present

## 2019-11-14 DIAGNOSIS — K219 Gastro-esophageal reflux disease without esophagitis: Secondary | ICD-10-CM | POA: Diagnosis present

## 2019-11-14 DIAGNOSIS — K760 Fatty (change of) liver, not elsewhere classified: Secondary | ICD-10-CM | POA: Diagnosis present

## 2019-11-14 DIAGNOSIS — R509 Fever, unspecified: Secondary | ICD-10-CM | POA: Diagnosis present

## 2019-11-14 DIAGNOSIS — Z9049 Acquired absence of other specified parts of digestive tract: Secondary | ICD-10-CM | POA: Diagnosis not present

## 2019-11-14 DIAGNOSIS — M1990 Unspecified osteoarthritis, unspecified site: Secondary | ICD-10-CM | POA: Diagnosis present

## 2019-11-14 DIAGNOSIS — Z20822 Contact with and (suspected) exposure to covid-19: Secondary | ICD-10-CM | POA: Diagnosis present

## 2019-11-14 DIAGNOSIS — Z87891 Personal history of nicotine dependence: Secondary | ICD-10-CM | POA: Diagnosis not present

## 2019-11-14 DIAGNOSIS — A419 Sepsis, unspecified organism: Secondary | ICD-10-CM | POA: Diagnosis present

## 2019-11-14 DIAGNOSIS — J189 Pneumonia, unspecified organism: Secondary | ICD-10-CM

## 2019-11-14 DIAGNOSIS — R296 Repeated falls: Secondary | ICD-10-CM | POA: Diagnosis present

## 2019-11-14 DIAGNOSIS — D61818 Other pancytopenia: Secondary | ICD-10-CM | POA: Diagnosis present

## 2019-11-14 DIAGNOSIS — R5081 Fever presenting with conditions classified elsewhere: Secondary | ICD-10-CM | POA: Diagnosis not present

## 2019-11-14 DIAGNOSIS — Z9484 Stem cells transplant status: Secondary | ICD-10-CM | POA: Diagnosis not present

## 2019-11-14 LAB — CBC WITH DIFFERENTIAL/PLATELET
Abs Immature Granulocytes: 0.01 10*3/uL (ref 0.00–0.07)
Basophils Absolute: 0 10*3/uL (ref 0.0–0.1)
Basophils Relative: 1 %
Eosinophils Absolute: 0.2 10*3/uL (ref 0.0–0.5)
Eosinophils Relative: 7 %
HCT: 26.3 % — ABNORMAL LOW (ref 36.0–46.0)
Hemoglobin: 8.7 g/dL — ABNORMAL LOW (ref 12.0–15.0)
Immature Granulocytes: 0 %
Lymphocytes Relative: 23 %
Lymphs Abs: 0.8 10*3/uL (ref 0.7–4.0)
MCH: 32.1 pg (ref 26.0–34.0)
MCHC: 33.1 g/dL (ref 30.0–36.0)
MCV: 97 fL (ref 80.0–100.0)
Monocytes Absolute: 0.9 10*3/uL (ref 0.1–1.0)
Monocytes Relative: 23 %
Neutro Abs: 1.7 10*3/uL (ref 1.7–7.7)
Neutrophils Relative %: 46 %
Platelets: 105 10*3/uL — ABNORMAL LOW (ref 150–400)
RBC: 2.71 MIL/uL — ABNORMAL LOW (ref 3.87–5.11)
RDW: 17.1 % — ABNORMAL HIGH (ref 11.5–15.5)
WBC: 3.7 10*3/uL — ABNORMAL LOW (ref 4.0–10.5)
nRBC: 0 % (ref 0.0–0.2)

## 2019-11-14 LAB — BASIC METABOLIC PANEL
Anion gap: 6 (ref 5–15)
BUN: 14 mg/dL (ref 8–23)
CO2: 23 mmol/L (ref 22–32)
Calcium: 8 mg/dL — ABNORMAL LOW (ref 8.9–10.3)
Chloride: 112 mmol/L — ABNORMAL HIGH (ref 98–111)
Creatinine, Ser: 0.91 mg/dL (ref 0.44–1.00)
GFR calc Af Amer: >60 mL/min (ref >60)
GFR calc non Af Amer: >60 mL/min (ref >60)
Glucose, Bld: 164 mg/dL — ABNORMAL HIGH (ref 70–99)
Potassium: 3.4 mmol/L — ABNORMAL LOW (ref 3.5–5.1)
Sodium: 141 mmol/L (ref 135–145)

## 2019-11-14 LAB — EXPECTORATED SPUTUM ASSESSMENT W GRAM STAIN, RFLX TO RESP C

## 2019-11-14 LAB — GLUCOSE, CAPILLARY
Glucose-Capillary: 110 mg/dL — ABNORMAL HIGH (ref 70–99)
Glucose-Capillary: 131 mg/dL — ABNORMAL HIGH (ref 70–99)
Glucose-Capillary: 195 mg/dL — ABNORMAL HIGH (ref 70–99)
Glucose-Capillary: 137 mg/dL — ABNORMAL HIGH (ref 70–99)
Glucose-Capillary: 133 mg/dL — ABNORMAL HIGH (ref 70–99)

## 2019-11-14 LAB — PHOSPHORUS: Phosphorus: 2.4 mg/dL — ABNORMAL LOW (ref 2.5–4.6)

## 2019-11-14 LAB — CBC
HCT: 25.4 % — ABNORMAL LOW (ref 36.0–46.0)
Hemoglobin: 8.6 g/dL — ABNORMAL LOW (ref 12.0–15.0)
MCH: 32.7 pg (ref 26.0–34.0)
MCHC: 33.9 g/dL (ref 30.0–36.0)
MCV: 96.6 fL (ref 80.0–100.0)
Platelets: 103 10*3/uL — ABNORMAL LOW (ref 150–400)
RBC: 2.63 MIL/uL — ABNORMAL LOW (ref 3.87–5.11)
RDW: 16.8 % — ABNORMAL HIGH (ref 11.5–15.5)
WBC: 3.8 10*3/uL — ABNORMAL LOW (ref 4.0–10.5)

## 2019-11-14 LAB — MAGNESIUM: Magnesium: 1.1 mg/dL — ABNORMAL LOW (ref 1.7–2.4)

## 2019-11-14 LAB — PROCALCITONIN: Procalcitonin: <0.1 ng/mL

## 2019-11-14 LAB — CORTISOL-AM, BLOOD: Cortisol - AM: 7.3 ug/dL (ref 6.7–22.6)

## 2019-11-14 LAB — PROTIME-INR
INR: 1.2 (ref 0.8–1.2)
Prothrombin Time: 14.5 seconds (ref 11.4–15.2)

## 2019-11-14 LAB — INFLUENZA PANEL BY PCR (TYPE A & B)
Influenza A By PCR: NEGATIVE
Influenza B By PCR: NEGATIVE

## 2019-11-14 LAB — MRSA PCR SCREENING: MRSA by PCR: NEGATIVE

## 2019-11-14 MED ORDER — MAGIC MOUTHWASH W/LIDOCAINE: 5.0000 mL | Freq: Four times a day (QID) | ORAL | Status: DC | PRN

## 2019-11-14 MED ORDER — CHLORHEXIDINE GLUCONATE CLOTH 2 % EX PADS
6.0000 | MEDICATED_PAD | Freq: Every day | CUTANEOUS | Status: DC
  Administered 2019-11-14 – 2019-11-16 (×3): 6 via TOPICAL

## 2019-11-14 MED ORDER — LIDOCAINE VISCOUS HCL 2 % MT SOLN
10.0000 mL | Freq: Four times a day (QID) | OROMUCOSAL | Status: DC | PRN
  Administered 2019-11-14: 10 mL via OROMUCOSAL
  Filled 2019-11-14 (×2): qty 15

## 2019-11-14 MED ORDER — ACYCLOVIR 200 MG PO CAPS
400.0000 mg | ORAL_CAPSULE | Freq: Two times a day (BID) | ORAL | Status: DC
  Administered 2019-11-14 – 2019-11-16 (×5): 400 mg via ORAL
  Filled 2019-11-14 (×6): qty 2

## 2019-11-14 MED ORDER — MAGIC MOUTHWASH
5.0000 mL | Freq: Four times a day (QID) | ORAL | Status: DC | PRN
  Administered 2019-11-14: 5 mL via ORAL
  Filled 2019-11-14 (×2): qty 10

## 2019-11-14 MED ORDER — DIPHENOXYLATE-ATROPINE 2.5-0.025 MG PO TABS: 2.0000 | ORAL_TABLET | Freq: Every day | ORAL | Status: DC | PRN

## 2019-11-14 MED ORDER — TRAZODONE HCL 100 MG PO TABS
100.0000 mg | ORAL_TABLET | Freq: Every evening | ORAL | Status: DC | PRN
  Administered 2019-11-15: 100 mg via ORAL
  Filled 2019-11-14: qty 1

## 2019-11-14 MED ORDER — PANTOPRAZOLE SODIUM 40 MG PO TBEC
40.0000 mg | DELAYED_RELEASE_TABLET | Freq: Every day | ORAL | Status: DC
  Administered 2019-11-14 – 2019-11-16 (×3): 40 mg via ORAL
  Filled 2019-11-14 (×3): qty 1

## 2019-11-14 MED ORDER — IBUPROFEN 400 MG PO TABS
200.0000 mg | ORAL_TABLET | Freq: Four times a day (QID) | ORAL | Status: AC | PRN
  Administered 2019-11-14 – 2019-11-15 (×2): 200 mg via ORAL
  Filled 2019-11-14 (×2): qty 1

## 2019-11-14 MED ORDER — ASPIRIN 81 MG PO CHEW
81.0000 mg | CHEWABLE_TABLET | Freq: Every day | ORAL | Status: DC
  Administered 2019-11-14 – 2019-11-16 (×3): 81 mg via ORAL
  Filled 2019-11-14 (×3): qty 1

## 2019-11-14 MED ORDER — FLUOXETINE HCL 20 MG PO CAPS
40.0000 mg | ORAL_CAPSULE | Freq: Every day | ORAL | Status: DC
  Administered 2019-11-14 – 2019-11-16 (×3): 40 mg via ORAL
  Filled 2019-11-14 (×3): qty 2

## 2019-11-14 MED ORDER — TIZANIDINE HCL 4 MG PO TABS
4.0000 mg | ORAL_TABLET | Freq: Three times a day (TID) | ORAL | Status: DC | PRN
  Administered 2019-11-14 – 2019-11-16 (×5): 4 mg via ORAL
  Filled 2019-11-14 (×7): qty 1

## 2019-11-14 MED ORDER — PROMETHAZINE HCL 25 MG PO TABS: 25.0000 mg | ORAL_TABLET | Freq: Four times a day (QID) | ORAL | Status: DC | PRN

## 2019-11-14 MED ORDER — HYDROXYZINE HCL 10 MG PO TABS
10.0000 mg | ORAL_TABLET | Freq: Three times a day (TID) | ORAL | Status: DC | PRN
  Filled 2019-11-14: qty 1

## 2019-11-14 MED ORDER — POMALIDOMIDE 3 MG PO CAPS: 3.0000 mg | ORAL_CAPSULE | Freq: Every day | ORAL | Status: DC

## 2019-11-14 MED ORDER — ALLOPURINOL 100 MG PO TABS
100.0000 mg | ORAL_TABLET | Freq: Every day | ORAL | Status: DC
  Administered 2019-11-14 – 2019-11-16 (×3): 100 mg via ORAL
  Filled 2019-11-14 (×3): qty 1

## 2019-11-14 MED ORDER — MAGNESIUM SULFATE 4 GM/100ML IV SOLN
4.0000 g | Freq: Once | INTRAVENOUS | Status: AC
  Administered 2019-11-14: 4 g via INTRAVENOUS
  Filled 2019-11-14: qty 100

## 2019-11-14 MED ORDER — POTASSIUM CHLORIDE CRYS ER 20 MEQ PO TBCR
40.0000 meq | EXTENDED_RELEASE_TABLET | Freq: Once | ORAL | Status: AC
  Administered 2019-11-14: 40 meq via ORAL
  Filled 2019-11-14: qty 2

## 2019-11-14 MED ORDER — MONTELUKAST SODIUM 10 MG PO TABS
10.0000 mg | ORAL_TABLET | Freq: Every day | ORAL | Status: DC
  Administered 2019-11-14 – 2019-11-15 (×2): 10 mg via ORAL
  Filled 2019-11-14 (×2): qty 1

## 2019-11-14 MED ORDER — LORATADINE 10 MG PO TABS
10.0000 mg | ORAL_TABLET | Freq: Every day | ORAL | Status: DC
  Administered 2019-11-14 – 2019-11-16 (×3): 10 mg via ORAL
  Filled 2019-11-14 (×3): qty 1

## 2019-11-14 MED ORDER — FENTANYL 25 MCG/HR TD PT72
1.0000 | MEDICATED_PATCH | TRANSDERMAL | Status: DC
  Administered 2019-11-15: 1 via TRANSDERMAL
  Filled 2019-11-14: qty 1

## 2019-11-14 NOTE — Consult Note (Signed)
[facility]  Date of admission:  11/13/2019  Inpatient day:  11/14/2019  Consulting physician:  Dr. Almira Coaster  Reason for Consultation:  Fever and pneumonia  Chief Complaint: Sarah [name] is a 64 y.o. female with recurrent multiple myeloma s/p autologous stem cell transplant currently day 21 of cycle #2 daratumumab, Pomalyst, and Decadron who was admitted through the [facility] room with fever, chills, and bilateral streaky infiltrate worrisome for pneumonia.  HPI: The patient was last seen in the medical oncology clinic on 11/12/2019.  At that time she was P fatigued.  She had a low-grade temperature (99.3).  Exam was stable.  WBC included a WBC was 4500 with an ANC of 2200.  She was off her Pomalyst.  She was instructed to contact the clinic if she had temperature greater than 100 or felt ill.  The patient describes the development of fever 200.2 greater than 100.3 and chills.  She noted a dry cough which appeared more productive and "tasted infected".  She has been unable to produce sputum.  She was seen in the [facility] ER. CXR revealed right streaky right infrahilar airspace opacity suspicious for pneumonia.  She was empirically started on Cefepime and vancomycin.  Cultures are negative to date.  Symptomatically, she remains fatigued.  She has a slight sore throat.  She has slightly increased cough.  She has been unable to produce sputum.  She denies any urinary symptoms.  She notes some left lower quadrant discomfort but no pain.  Bowel movements have been normal.   She denies any rashes.   Past Medical History:  Diagnosis Date  . Abnormal stress test 02/14/2016   Overview:  Added automatically from request for surgery [id number]  . Anemia   . Anxiety   . Arthritis   . Bicuspid aortic valve   . CHF (congestive heart failure) ([place])   . CKD (chronic kidney disease) stage 3, GFR 30-59 ml/min   . Depression   . Diabetes mellitus ([place])   . Dizziness   . Fatty liver    . Frequent falls   . GERD (gastroesophageal reflux disease)   . Gout   . Heart murmur   . History of blood transfusion   . History of bone marrow transplant ([place])   . History of uterine fibroid   . Hx of cardiac catheterization 06/05/2016   Overview:  Normal coronaries 2017  . Hypertension   . Hypomagnesemia   . Multiple myeloma ([place])   . Personal history of chemotherapy   . Renal cyst     Past Surgical History:  Procedure Laterality Date  . ABDOMINAL HYSTERECTOMY    . 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 CARDIAC ELECTROPHYSIOLOGY MAPPING AND ABLATION    . CARPAL TUNNEL RELEASE Bilateral   . CHOLECYSTECTOMY  2008  . COLONOSCOPY WITH PROPOFOL N/A 05/07/2017   Procedure: COLONOSCOPY WITH PROPOFOL;  Surgeon: [name], MD;  Location: [facility] ENDOSCOPY;  Service: Gastroenterology;  Laterality: N/A;  . ESOPHAGOGASTRODUODENOSCOPY (EGD) WITH PROPOFOL N/A 05/07/2017   Procedure: ESOPHAGOGASTRODUODENOSCOPY (EGD) WITH PROPOFOL;  Surgeon: [name], MD;  Location: [facility] ENDOSCOPY;  Service: Gastroenterology;  Laterality: N/A;  . FOOT SURGERY Bilateral   . INCONTINENCE SURGERY  2009  . INTERSTIM IMPLANT PLACEMENT    . other     over active bladder  . OTHER SURGICAL HISTORY     bladder stimulator   . PARTIAL HYSTERECTOMY  03/1996   fibroids  . PORTA CATH INSERTION N/A 03/10/2019   Procedure: Glori Luis  CATH INSERTION;  Surgeon: Algernon Huxley, MD;  Location: [facility];  Service: Cardiovascular;  Laterality: N/A;  . TONSILLECTOMY  2007    Family History  Problem Relation Age of Onset  . Colon cancer Father   . Renal Disease Father   . Diabetes Mellitus II Father   . Melanoma Paternal Grandmother   . Breast cancer Maternal Aunt 65  . Anemia Mother   . Heart disease Mother   . Heart failure Mother   . Renal Disease Mother   . Congestive Heart Failure Mother   . Heart disease Maternal Uncle   . Throat cancer Maternal Uncle   . Lung cancer Maternal Uncle   . Liver disease  Maternal Uncle   . Heart failure Maternal Uncle   . Hearing loss Son 76       Suicide     Social History:  reports that she quit smoking about 28 years ago. Her smoking use included cigarettes. She has a 20.00 pack-year smoking history. She has never used smokeless tobacco. She reports previous alcohol use. She reports that she does not use drugs.  The patient denies any exposure to radiation or toxins.  The patient lives in [place].  She is alone today.  Allergies:  Allergies  Allergen Reactions  . Oxycodone-Acetaminophen Anaphylaxis    Swelling and rash  . Celebrex [Celecoxib] Diarrhea  . Codeine   . Benadryl [Diphenhydramine] Palpitations  . Morphine Itching and Rash  . Ondansetron Diarrhea  . Tylenol [Acetaminophen] Itching and Rash    Medications Prior to Admission  Medication Sig Dispense Refill  . acetaminophen (TYLENOL) 500 MG tablet Take 500 mg by mouth every 6 (six) hours as needed.    Marland Kitchen acyclovir (ZOVIRAX) 400 MG tablet Take 1 tablet (400 mg total) by mouth 2 (two) times daily. 60 tablet 11  . allopurinol (ZYLOPRIM) 100 MG tablet TAKE 1 TABLET(100 MG) BY MOUTH DAILY as needed 90 tablet 1  . aspirin 81 MG chewable tablet Chew 1 tablet (81 mg total) by mouth daily. 30 tablet 0  . dexamethasone (DECADRON) 4 MG tablet Take 5 tablets (75m) on days 2, 9, 16, 23 of cycle 1 and 2 Take 5 tablets (233m on days 2, 16 of cycle 3,4,5 and 6 Take 5 tablets (2080mon days 2 of cycles 7 and beyond 120 tablet 0  . diclofenac sodium (VOLTAREN) 1 % GEL Apply 2 g topically 4 (four) times daily. 100 g 1  . diphenhydrAMINE (BENADRYL) 50 MG capsule Take 50 mg by mouth as needed.    . diphenoxylate-atropine (LOMOTIL) 2.5-0.025 MG tablet Take 2 tablets by mouth daily as needed for diarrhea or loose stools. 45 tablet 2  . fentaNYL (DURAGESIC - DOSED MCG/HR) 25 MCG/HR patch Place 25 mcg onto the skin every 3 (three) days.     . fexofenadine (ALLEGRA) 180 MG tablet TAKE 1 TABLET(180 MG) BY MOUTH  DAILY 30 tablet 5  . FLUoxetine (PROZAC) 40 MG capsule TAKE 1 CAPSULE EVERY DAY 90 capsule 1  . fluticasone (FLONASE) 50 MCG/ACT nasal spray Place 2 sprays into both nostrils daily. 16 g 2  . furosemide (LASIX) 20 MG tablet Take 20 mg by mouth as needed.     . hydrOXYzine (ATARAX/VISTARIL) 10 MG tablet TAKE 1 TABLET(10 MG) BY MOUTH THREE TIMES DAILY AS NEEDED 90 tablet 0  . lactulose (CHRONULAC) 10 GM/15ML solution     . lidocaine-prilocaine (EMLA) cream APPLY TOPICALLY AS NEEDED. 30 g 0  . metFORMIN (  GLUCOPHAGE-XR) 500 MG 24 hr tablet Take 1 tablet (500 mg total) by mouth daily with breakfast. 90 tablet 1  . montelukast (SINGULAIR) 10 MG tablet Take 1 tablet by mouth on the day before treatment and for 2 days after treatment. 30 tablet 0  . NARCAN 4 MG/0.1ML LIQD nasal spray kit 1 spray.    Marland Kitchen omeprazole (PRILOSEC) 40 MG capsule Take 1 capsule (40 mg total) by mouth daily. 90 capsule 1  . pomalidomide (POMALYST) 3 MG capsule Take 1 capsule (3 mg total) by mouth daily. [employer] Auth #  [id number]      Date Obtained 10/19/2019 21 capsule 0  . promethazine (PHENERGAN) 25 MG tablet Take 1 tablet (25 mg total) by mouth every 6 (six) hours as needed for nausea or vomiting. 90 tablet 0  . promethazine-dextromethorphan (PROMETHAZINE-DM) 6.25-15 MG/5ML syrup Take 5 mLs by mouth 4 (four) times daily as needed for cough. 240 mL 0  . tiZANidine (ZANAFLEX) 4 MG tablet Take 1 tablet (4 mg total) by mouth every 8 (eight) hours as needed for muscle spasms. 270 tablet 1  . traZODone (DESYREL) 100 MG tablet TAKE 1 TABLET AT BEDTIME  FOR  SLEEP 90 tablet 1  . glucose blood (ONE TOUCH ULTRA TEST) test strip     . Misc Natural Products (OSTEO BI-FLEX ADV TRIPLE ST PO) Take 2 tablets by mouth daily.    . Multiple Vitamins-Minerals (HAIR/SKIN/NAILS/BIOTIN PO) Take 1 capsule by mouth 3 (three) times daily.    . Omega 3-6-9 Fatty Acids (OMEGA 3-6-9 COMPLEX) CAPS Take by mouth.    . pravastatin (PRAVACHOL) 20 MG tablet TAKE 1  TABLET EVERY DAY (Patient not taking: Reported on 11/12/2019) 90 tablet 1    Review of Systems: GENERAL:  Fatigue.  Fever > 103.00 with associated chills yesterday.  No sweats or weight loss. PERFORMANCE STATUS (ECOG):  2 HEENT:  Sore throat.  No visual changes, runny nose, mouth sores or tenderness. Lungs: Shortness of breath with exertion (chronic).  Chronic dry cough.  No hemoptysis. Cardiac:  No chest pain, palpitations, orthopnea, or PND. GI:  Chronic nausea.  No vomiting, diarrhea, constipation, melena or hematochezia. GU:  Bladder incontinence.  No urgency, frequency, dysuria, or hematuria. Musculoskeletal:  Back pain.  No joint pain.  No muscle tenderness. Extremities:  No pain or swelling. Skin:  No rashes or skin changes. Neuro:  Chronic numbness in fingers.  No headache, focal weakness, balance or coordination issues. Endocrine:  Diabetes.  No thyroid issues, hot flashes or night sweats. Psych:  No mood changes, depression or anxiety. Pain:  No focal pain. Review of systems:  All other systems reviewed and found to be negative.  Physical Exam:  Blood pressure 135/62, pulse 82, temperature (!) 100.8 F (38.2 C), temperature source Oral, resp. rate 16, height 5' 2" (1.575 m), weight 152 lb 8.9 oz (69.2 kg), SpO2 99 %.  GENERAL:  Well developed, well nourished, woman sitting comfortably on the medical unit in no acute distress. MENTAL STATUS:  Alert and oriented to person, place and time. HEAD:  Curly blonde hair.  Normocephalic, atraumatic, face symmetric, no Cushingoid features. EYES:  Glasses.  Brown eyes.  Pupils equal round and reactive to light and accomodation.  No conjunctivitis or scleral icterus. ENT:  Oropharynx clear without lesion.  Tongue normal. Mucous membranes moist.  RESPIRATORY:  Clear to auscultation without rales, wheezes or rhonchi. CARDIOVASCULAR:  Regular rate and rhythm with apparent flow murmur.  No rub or gallop. ABDOMEN:  Soft,  slightly tender in the  LLQ without guarding or rebound tenderness.  Active bowel sounds and no hepatosplenomegaly.  No masses. SKIN:  No rashes, ulcers or lesions. EXTREMITIES: No edema, no skin discoloration or tenderness.  No palpable cords.  No stigmata of SBE. LYMPH NODES: No palpable cervical, supraclavicular, axillary or inguinal adenopathy  NEUROLOGICAL: Unremarkable. PSYCH:  Appropriate.   Results for orders placed or performed during the hospital encounter of 11/13/19 (from the past 48 hour(s))  Lactic acid, plasma     Status: Abnormal   Collection Time: 11/13/19  6:43 PM  Result Value Ref Range   Lactic Acid, Venous 2.0 (HH) 0.5 - 1.9 mmol/L    Comment: CRITICAL RESULT CALLED TO, READ BACK BY AND VERIFIED WITH REBECCA LYNN _0  11/13/19 MJU Performed at [facility], [address] [place], [place] [address]   Comprehensive metabolic panel     Status: Abnormal   Collection Time: 11/13/19  6:43 PM  Result Value Ref Range   Sodium 139 135 - 145 mmol/L   Potassium 3.7 3.5 - 5.1 mmol/L   Chloride 107 98 - 111 mmol/L   CO2 23 22 - 32 mmol/L   Glucose, Bld 160 (H) 70 - 99 mg/dL    Comment: Glucose reference range applies only to samples taken after fasting for at least 8 hours.   BUN 15 8 - 23 mg/dL   Creatinine, Ser 1.02 (H) 0.44 - 1.00 mg/dL   Calcium 9.0 8.9 - 10.3 mg/dL   Total Protein 6.3 (L) 6.5 - 8.1 g/dL   Albumin 4.0 3.5 - 5.0 g/dL   AST 33 15 - 41 U/L   ALT 48 (H) 0 - 44 U/L   Alkaline Phosphatase 84 38 - 126 U/L   Total Bilirubin 0.9 0.3 - 1.2 mg/dL   GFR calc non Af Amer 58 (L) >60 mL/min   GFR calc Af Amer >60 >60 mL/min   Anion gap 9 5 - 15    Comment: Performed at [facility], [address] [place], [place] [address]  CBC WITH DIFFERENTIAL     Status: Abnormal   Collection Time: 11/13/19  6:43 PM  Result Value Ref Range   WBC 4.3 4.0 - 10.5 K/uL   RBC 2.96 (L) 3.87 - 5.11 MIL/uL   Hemoglobin 9.6 (L) 12.0 - 15.0 g/dL   HCT 27.9 (L) 36.0 - 46.0 %   MCV  94.3 80.0 - 100.0 fL   MCH 32.4 26.0 - 34.0 pg   MCHC 34.4 30.0 - 36.0 g/dL   RDW 16.9 (H) 11.5 - 15.5 %   Platelets 123 (L) 150 - 400 K/uL   nRBC 0.0 0.0 - 0.2 %   Neutrophils Relative % 48 %   Neutro Abs 2.0 1.7 - 7.7 K/uL   Lymphocytes Relative 13 %   Lymphs Abs 0.5 (L) 0.7 - 4.0 K/uL   Monocytes Relative 22 %   Monocytes Absolute 1.0 0.1 - 1.0 K/uL   Eosinophils Relative 5 %   Eosinophils Absolute 0.2 0.0 - 0.5 K/uL   Basophils Relative 1 %   Basophils Absolute 0.1 0.0 - 0.1 K/uL   Immature Granulocytes 11 %   Abs Immature Granulocytes 0.48 (H) 0.00 - 0.07 K/uL   Reactive, Benign Lymphocytes PRESENT    Tear Drop Cells PRESENT    Spherocytes PRESENT     Comment: Performed at [facility], [address] [place], [place] [address]  Procalcitonin - Baseline     Status:  None   Collection Time: 11/13/19  6:43 PM  Result Value Ref Range   Procalcitonin <0.10 ng/mL    Comment:        Interpretation: PCT (Procalcitonin) <= 0.5 ng/mL: Systemic infection (sepsis) is not likely. Local bacterial infection is possible. (NOTE)       Sepsis PCT Algorithm           Lower Respiratory Tract                                      Infection PCT Algorithm    ----------------------------     ----------------------------         PCT < 0.25 ng/mL                PCT < 0.10 ng/mL         Strongly encourage             Strongly discourage   discontinuation of antibiotics    initiation of antibiotics    ----------------------------     -----------------------------       PCT 0.25 - 0.50 ng/mL            PCT 0.10 - 0.25 ng/mL               OR       >80% decrease in PCT            Discourage initiation of                                            antibiotics      Encourage discontinuation           of antibiotics    ----------------------------     -----------------------------         PCT >= 0.50 ng/mL              PCT 0.26 - 0.50 ng/mL               AND        <80% decrease in PCT              Encourage initiation of                                             antibiotics       Encourage continuation           of antibiotics    ----------------------------     -----------------------------        PCT >= 0.50 ng/mL                  PCT > 0.50 ng/mL               AND         increase in PCT                  Strongly encourage                                      initiation of antibiotics    Strongly encourage  escalation           of antibiotics                                     -----------------------------                                           PCT <= 0.25 ng/mL                                                 OR                                        > 80% decrease in PCT                                     Discontinue / Do not initiate                                             antibiotics Performed at [facility], [address] [place], [place] [address]   Lactic acid, plasma     Status: None   Collection Time: 11/13/19  7:41 PM  Result Value Ref Range   Lactic Acid, Venous 1.3 0.5 - 1.9 mmol/L    Comment: Performed at [facility], [address] [place], [place] [address]  Blood gas, venous ([facility], [facility], [facility])     Status: Abnormal   Collection Time: 11/13/19  7:41 PM  Result Value Ref Range   pH, Ven 7.42 7.250 - 7.430   pCO2, Ven 36 (L) 44.0 - 60.0 mmHg   pO2, Ven 74.0 (H) 32.0 - 45.0 mmHg   Bicarbonate 23.4 20.0 - 28.0 mmol/L   Acid-base deficit 0.7 0.0 - 2.0 mmol/L   O2 Saturation 95.0 %   Patient temperature 37.0    Collection site LINE    Sample type VENOUS     Comment: Performed at [facility], [address] [place], [place] [address]  SARS Coronavirus 2 by RT PCR (hospital order, performed in [facility] hospital lab) Nasopharyngeal Nasopharyngeal Swab     Status: None   Collection Time: 11/13/19  7:41 PM   Specimen: Nasopharyngeal Swab  Result Value Ref Range   SARS Coronavirus 2 NEGATIVE NEGATIVE     Comment: (NOTE) SARS-CoV-2 target nucleic acids are NOT DETECTED. The SARS-CoV-2 RNA is generally detectable in upper and lower respiratory specimens during the acute phase of infection. The lowest concentration of SARS-CoV-2 viral copies this assay can detect is 250 copies / mL. A negative result does not preclude SARS-CoV-2 infection and should not be used as the sole basis for treatment or other patient management decisions.  A negative result may occur with improper specimen collection / handling, submission of specimen other than nasopharyngeal swab, presence of viral mutation(s) within the areas targeted by this assay, and inadequate  number of viral copies (<250 copies / mL). A negative result must be combined with clinical observations, patient history, and epidemiological information. Fact Sheet for Patients:   StrictlyIdeas.no Fact Sheet for Healthcare Providers: BankingDealers.co.za This test is not yet approved or cleared  by the [place] FDA and has been authorized for detection and/or diagnosis of SARS-CoV-2 by FDA under an Emergency Use Authorization (EUA).  This EUA will remain in effect (meaning this test can be used) for the duration of the COVID-19 declaration under Section 564(b)(1) of the Act, 21 U.S.C. section 360bbb-3(b)(1), unless the authorization is terminated or revoked sooner. Performed at [facility], [address] [place], [place] [address]   Influenza panel by PCR (type A & B)     Status: None   Collection Time: 11/13/19  7:41 PM  Result Value Ref Range   Influenza A By PCR NEGATIVE NEGATIVE   Influenza B By PCR NEGATIVE NEGATIVE    Comment: (NOTE) The Xpert Xpress Flu assay is intended as an aid in the diagnosis of  influenza and should not be used as a sole basis for treatment.  This  assay is FDA approved for nasopharyngeal swab specimens only. Nasal  washings and aspirates are  unacceptable for Xpert Xpress Flu testing. Performed at [facility], [address] [place], [place] [address]   Glucose, capillary     Status: Abnormal   Collection Time: 11/13/19 10:06 PM  Result Value Ref Range   Glucose-Capillary 128 (H) 70 - 99 mg/dL    Comment: Glucose reference range applies only to samples taken after fasting for at least 8 hours.  Urinalysis, Complete w Microscopic     Status: Abnormal   Collection Time: 11/13/19 10:59 PM  Result Value Ref Range   Color, Urine YELLOW (A) YELLOW   APPearance CLEAR (A) CLEAR   Specific Gravity, Urine 1.010 1.005 - 1.030   pH 5.0 5.0 - 8.0   Glucose, UA NEGATIVE NEGATIVE mg/dL   Hgb urine dipstick NEGATIVE NEGATIVE   Bilirubin Urine NEGATIVE NEGATIVE   Ketones, ur NEGATIVE NEGATIVE mg/dL   Protein, ur NEGATIVE NEGATIVE mg/dL   Nitrite NEGATIVE NEGATIVE   Leukocytes,Ua TRACE (A) NEGATIVE   WBC, UA 11-20 0 - 5 WBC/hpf   Bacteria, UA NONE SEEN NONE SEEN   Squamous Epithelial / LPF 0-5 0 - 5    Comment: Performed at [facility], [address] [place], [place] [address]  Protime-INR     Status: None   Collection Time: 11/14/19  3:33 AM  Result Value Ref Range   Prothrombin Time 14.5 11.4 - 15.2 seconds   INR 1.2 0.8 - 1.2    Comment: (NOTE) INR goal varies based on device and disease states. Performed at [facility], [address] [place], [place] [address]   Cortisol-am, blood     Status: None   Collection Time: 11/14/19  3:33 AM  Result Value Ref Range   Cortisol - AM 7.3 6.7 - 22.6 ug/dL    Comment: Performed at [facility] [address], [place],  [address]  Procalcitonin     Status: None   Collection Time: 11/14/19  3:33 AM  Result Value Ref Range   Procalcitonin <0.10 ng/mL    Comment:        Interpretation: PCT (Procalcitonin) <= 0.5 ng/mL: Systemic infection (sepsis) is not likely. Local bacterial infection is possible. (NOTE)       Sepsis PCT Algorithm  Lower Respiratory Tract                                      Infection PCT Algorithm    ----------------------------     ----------------------------         PCT < 0.25 ng/mL                PCT < 0.10 ng/mL         Strongly encourage             Strongly discourage   discontinuation of antibiotics    initiation of antibiotics    ----------------------------     -----------------------------       PCT 0.25 - 0.50 ng/mL            PCT 0.10 - 0.25 ng/mL               OR       >80% decrease in PCT            Discourage initiation of                                            antibiotics      Encourage discontinuation           of antibiotics    ----------------------------     -----------------------------         PCT >= 0.50 ng/mL              PCT 0.26 - 0.50 ng/mL               AND        <80% decrease in PCT             Encourage initiation of                                             antibiotics       Encourage continuation           of antibiotics    ----------------------------     -----------------------------        PCT >= 0.50 ng/mL                  PCT > 0.50 ng/mL               AND         increase in PCT                  Strongly encourage                                      initiation of antibiotics    Strongly encourage escalation           of antibiotics                                     -----------------------------  PCT <= 0.25 ng/mL                                                 OR                                        > 80% decrease in PCT                                     Discontinue / Do not initiate                                             antibiotics Performed at [facility], [address] [place], [place] [address]   CBC     Status: Abnormal   Collection Time: 11/14/19  3:33 AM  Result Value Ref Range   WBC 3.8 (L) 4.0 - 10.5 K/uL   RBC 2.63 (L) 3.87 - 5.11 MIL/uL   Hemoglobin 8.6  (L) 12.0 - 15.0 g/dL   HCT 25.4 (L) 36.0 - 46.0 %   MCV 96.6 80.0 - 100.0 fL   MCH 32.7 26.0 - 34.0 pg   MCHC 33.9 30.0 - 36.0 g/dL   RDW 16.8 (H) 11.5 - 15.5 %   Platelets 103 (L) 150 - 400 K/uL    Comment: Immature Platelet Fraction may be clinically indicated, consider ordering this additional test [id number]    nRBC Not Measured 0.0 - 0.2 %    Comment: Performed at [facility], [address], [place], [place] [address]  Basic metabolic panel     Status: Abnormal   Collection Time: 11/14/19  3:33 AM  Result Value Ref Range   Sodium 141 135 - 145 mmol/L   Potassium 3.4 (L) 3.5 - 5.1 mmol/L   Chloride 112 (H) 98 - 111 mmol/L   CO2 23 22 - 32 mmol/L   Glucose, Bld 164 (H) 70 - 99 mg/dL    Comment: Glucose reference range applies only to samples taken after fasting for at least 8 hours.   BUN 14 8 - 23 mg/dL   Creatinine, Ser 0.91 0.44 - 1.00 mg/dL   Calcium 8.0 (L) 8.9 - 10.3 mg/dL   GFR calc non Af Amer >60 >60 mL/min   GFR calc Af Amer >60 >60 mL/min   Anion gap 6 5 - 15    Comment: Performed at [facility], [address] [place], [place] [address]  CBC with Differential/Platelet     Status: Abnormal   Collection Time: 11/14/19  3:33 AM  Result Value Ref Range   WBC 3.7 (L) 4.0 - 10.5 K/uL   RBC 2.71 (L) 3.87 - 5.11 MIL/uL   Hemoglobin 8.7 (L) 12.0 - 15.0 g/dL   HCT 26.3 (L) 36.0 - 46.0 %   MCV 97.0 80.0 - 100.0 fL   MCH 32.1 26.0 - 34.0 pg   MCHC 33.1 30.0 - 36.0 g/dL   RDW 17.1 (H) 11.5 - 15.5 %   Platelets 105 (L) 150 - 400 K/uL    Comment: Immature  Platelet Fraction may be clinically indicated, consider ordering this additional test [id number]    nRBC 0.0 0.0 - 0.2 %   Neutrophils Relative % 46 %   Neutro Abs 1.7 1.7 - 7.7 K/uL   Lymphocytes Relative 23 %   Lymphs Abs 0.8 0.7 - 4.0 K/uL   Monocytes Relative 23 %   Monocytes Absolute 0.9 0.1 - 1.0 K/uL   Eosinophils Relative 7 %   Eosinophils Absolute 0.2 0.0 - 0.5 K/uL   Basophils  Relative 1 %   Basophils Absolute 0.0 0.0 - 0.1 K/uL   Immature Granulocytes 0 %   Abs Immature Granulocytes 0.01 0.00 - 0.07 K/uL    Comment: Performed at [facility], [address], [place], [place] [address]  Magnesium     Status: Abnormal   Collection Time: 11/14/19  3:33 AM  Result Value Ref Range   Magnesium 1.1 (L) 1.7 - 2.4 mg/dL    Comment: Performed at [facility], [address], [place], [place] [address]  Phosphorus     Status: Abnormal   Collection Time: 11/14/19  3:33 AM  Result Value Ref Range   Phosphorus 2.4 (L) 2.5 - 4.6 mg/dL    Comment: Performed at [facility], [address], [place], [place] [address]  Blood Culture (routine x 2)     Status: None (Preliminary result)   Collection Time: 11/14/19  3:39 AM   Specimen: BLOOD  Result Value Ref Range   Specimen Description BLOOD RIGHT ANTECUBITAL    Special Requests      BOTTLES DRAWN AEROBIC AND ANAEROBIC Blood Culture adequate volume   Culture      NO GROWTH < 12 HOURS Performed at [facility], [address] [place], [place] [address]    Report Status PENDING   Blood Culture (routine x 2)     Status: None (Preliminary result)   Collection Time: 11/14/19  3:39 AM   Specimen: BLOOD  Result Value Ref Range   Specimen Description BLOOD LEFT ANTECUBITAL    Special Requests      BOTTLES DRAWN AEROBIC AND ANAEROBIC Blood Culture results may not be optimal due to an excessive volume of blood received in culture bottles   Culture      NO GROWTH <12 HOURS Performed at [facility], [address], [place], [place] [address]    Report Status PENDING   Glucose, capillary     Status: Abnormal   Collection Time: 11/14/19  8:01 AM  Result Value Ref Range   Glucose-Capillary 110 (H) 70 - 99 mg/dL    Comment: Glucose reference range applies only to samples taken after fasting for at least 8 hours.   Comment 1 Notify RN   MRSA PCR Screening     Status: None    Collection Time: 11/14/19 11:39 AM   Specimen: Nasal Mucosa; Nasopharyngeal  Result Value Ref Range   MRSA by PCR NEGATIVE NEGATIVE    Comment:        The GeneXpert MRSA Assay (FDA approved for NASAL specimens only), is one component of a comprehensive MRSA colonization surveillance program. It is not intended to diagnose MRSA infection nor to guide or monitor treatment for MRSA infections. Performed at [facility], [address] [place], [place] [address]   Glucose, capillary     Status: Abnormal   Collection Time: 11/14/19 11:56 AM  Result Value Ref Range   Glucose-Capillary 131 (H) 70 - 99 mg/dL  Comment: Glucose reference range applies only to samples taken after fasting for at least 8 hours.   Comment 1 Notify RN    DG Chest Port 1 View  Result Date: 11/13/2019 CLINICAL DATA:  Fever EXAM: PORTABLE CHEST 1 VIEW COMPARISON:  10/15/2019 FINDINGS: Right-sided chest port terminates at the level of the superior cavoatrial junction. Normal heart size. Streaky right infrahilar airspace opacity. Left lung appears clear. No pleural effusion or pneumothorax. IMPRESSION: Streaky right infrahilar airspace opacity suspicious for pneumonia. Electronically Signed   By: Davina Poke D.O.   On: 11/13/2019 19:04    Assessment:  The patient is a 64 y.o. woman with recurrent multiple myeloma s/p autologous stem cell transplant who was admitted through the ER with fever (103) and chills.  Events are similar to 1 month ago.    CXR on 11/13/2019 revealed right streaky right infrahilar airspace opacity suspicious for pneumonia.    Symptomatically, she has a dry cough and a flow murmur.  She has mild tenderness in the LLQ.  WBC 3700 (Hector 1700).  Plan:   1.   Multiple myeloma  Patient is day 21 of cycle #2 daratumumab SQ, Pomalyst, and dexamethasone (DPd).  Pomalyst was held last week secondary to declining counts and events of last cycle (admission for fever and  neutropenia).  Continue to hold Pomalyst.  Follow CBC daily. 2.   Fever and chills  Broad spectrum antbioticis (vancomycin and Cefepime) continue.  Follow cultures.  Blood cultures q 24 hours prn temp >= 100.4.  Unclear significant of right streaky infrahilar airspace disease.   Consider repeat CXR tomorrow to assess pneumonia after hydration.  Unusual that counts dropped and fever occurred during the same time frame as with cycle #1 (nadir counts).    Consider echo to assess valvular function as well as EF.   Consider abdomen and pelvis CT secondary to left lower quadrant discomfort.    Consider ID re-evaluation (seen with last hospitalization). 3.   Chronic hypomagnesemia  Check magnesium in AM.  She typically receives IV magnesium twice a week (Mondays and Thursdays).  Thank you for allowing me to participate in Yocelin Vanlue 's care.  I will follow her closely with you while hospitalized and after discharge in the outpatient department.   Lequita Asal, MD  11/14/2019, 3:54 PM

## 2019-11-14 NOTE — Progress Notes (Signed)
Confirmed with ED RN that Abx were given after [facility] drawn and he stated that was correct.

## 2019-11-14 NOTE — Progress Notes (Signed)
Spoke with [facility] stating that they do not have blood cultures. Per NP ok to have lab redraw them.

## 2019-11-14 NOTE — Progress Notes (Addendum)
Progress Note    Sarah [name]  WPV:[id number] DOB: 09/02/1955  DOA: 11/13/2019 PCP: Glean Hess, MD      Brief Narrative:    Medical records reviewed and are as summarized below:  Sarah [name] is an 64 y.o. female with medical history significant for multiple myeloma with history of autologous stem cell transplant 2016, currently on chemotherapy with daratumumab, Pomalyst and dexamethasone, as well as history of HTN, DM, CKD 3 and mild aortic stenosis, with recent hospitalization in April 2021 for neutropenic fever, treated with broad-spectrum antibiotics but with no source identified.  She presented to the hospital because of cough, generalized weakness, sore throat, nausea that has been going on for several days.  She was febrile and tachycardic in the emergency room with temperature of 103 F.  Chest x-ray was concerning for right-sided pneumonia.      Assessment/Plan:   Principal Problem:   Sepsis ([place]) Active Problems:   Multiple myeloma not having achieved remission (HCC)   Type II diabetes mellitus with complication (HCC)   Mild aortic valve stenosis   Benign essential HTN   Stage 3 chronic kidney disease   Pneumonia   Sepsis secondary to pneumonia -Patient presented with fever of 103, starting on the day of arrival, preceded by a several day history of sore throat, nausea and generalized malaise with chest x-ray showing likely pneumonia. -Recently hospitalized in April 2021 with neutropenic fever treated with broad-spectrum antibiotics, evaluated by ID at the time Continue empiric IV antibiotics follow-up blood cultures.  Discontinue IV fluids.    Multiple myeloma not having achieved remission [facility]) -Follows outpatient with Dr. Mike Gip, last seen 11/08/2019 -On chemotherapy.  Continue pomalidomide.    Stage 3a chronic kidney disease Clinically stable.  Continue to monitor.  Hypokalemia and hypomagnesemia Potassium was 3.4 and magnesium of 1.1.   Replete potassium with oral potassium chloride and magnesium with IV magnesium sulfate.    Type II diabetes mellitus with complication (HCC) -Sliding scale insulin coverage pending med rec  History of atrial flutter status post ablation 1999 History of moderate aortic stenosis No acute issues.  EF 50% noted on echo done in 04/2019 -Follows with Dr. Nehemiah Massed in the outpatient    Body mass index is 27.9 kg/m.   Family Communication/Anticipated D/C date and plan/Code Status   DVT prophylaxis: Lovenox Code Status: Full code Family Communication: Plan discussed with patient Disposition Plan:    Status is: Observation  The patient will require care spanning > 2 midnights and should be moved to inpatient because: IV treatments appropriate due to intensity of illness or inability to take PO  Dispo: The patient is from: Home              Anticipated d/c is to: Home              Anticipated d/c date is: 2 days              Patient currently is not medically stable to d/c.                 Subjective:   C/o cough, sore throat and chest pain from coughing.  She also has pain in the right ear but no discharge from the ear.  She feels short of breath with exertion but she said this is nothing new has been going on since she started taking pomalidomide.  Objective:    Vitals:   11/14/19 7482 11/14/19 0502 11/14/19 7078 11/14/19 6754  BP: 133/65 140/61  130/67  Pulse: 84 73  86  Resp: 18 18  18   Temp: 99.5 F (37.5 C) 98.8 F (37.1 C)  99.1 F (37.3 C)  TempSrc: Oral Oral  Oral  SpO2: 100% 90% 95% 99%  Weight: 69.2 kg     Height: 5' 2"  (1.575 m)      No data found.   Intake/Output Summary (Last 24 hours) at 11/14/2019 1303 Last data filed at 11/14/2019 1004 Gross per 24 hour  Intake 240.71 ml  Output --  Net 240.71 ml   Filed Weights   11/13/19 1821 11/14/19 0024  Weight: 66.4 kg 69.2 kg    Exam:  GEN: NAD SKIN: Warm and dry.  No rash  noted. EYES: EOMI ENT: MMM.  No discharge, mastoid or tragus tenderness noted in the ears.  No pharyngeal erythema or exudate noted.  No palpable cervical lymphadenopathy. CV: RRR PULM: CTA B ABD: soft, ND, NT, +BS CNS: AAO x 3, non focal EXT: No edema or tenderness   Data Reviewed:   I have personally reviewed following labs and imaging studies:  Labs: Labs show the following:   Basic Metabolic Panel: Recent Labs  Lab 11/08/19 0911 11/08/19 0911 11/11/19 0902 11/12/19 0846 11/12/19 0846 11/13/19 1843 11/14/19 0333  NA 137  --   --  134*  --  139 141  K 4.0   < >  --  3.3*   < > 3.7 3.4*  CL 104  --   --  101  --  107 112*  CO2 23  --   --  22  --  23 23  GLUCOSE 148*  --   --  146*  --  160* 164*  BUN 17  --   --  23  --  15 14  CREATININE 0.94  --   --  1.01*  --  1.02* 0.91  CALCIUM 8.9  --   --  8.7*  --  9.0 8.0*  MG 1.1*  --  1.2*  --   --   --  1.1*  PHOS  --   --   --   --   --   --  2.4*   < > = values in this interval not displayed.   GFR Estimated Creatinine Clearance: 57.6 mL/min (by C-G formula based on SCr of 0.91 mg/dL). Liver Function Tests: Recent Labs  Lab 11/08/19 0911 11/13/19 1843  AST 24 33  ALT 29 48*  ALKPHOS 76 84  BILITOT 0.6 0.9  PROT 6.8 6.3*  ALBUMIN 4.2 4.0   No results for input(s): LIPASE, AMYLASE in the last 168 hours. No results for input(s): AMMONIA in the last 168 hours. Coagulation profile Recent Labs  Lab 11/14/19 0333  INR 1.2    CBC: Recent Labs  Lab 11/08/19 0911 11/11/19 0902 11/12/19 0846 11/13/19 1843 11/14/19 0333  WBC 3.5* 2.9* 4.5 4.3 3.7*  3.8*  NEUTROABS 2.2 1.0* 2.2 2.0 1.7  HGB 9.6* 10.4* 9.9* 9.6* 8.7*  8.6*  HCT 27.7* 29.9* 28.8* 27.9* 26.3*  25.4*  MCV 91.7 92.0 93.2 94.3 97.0  96.6  PLT 90* 102* 108* 123* 105*  103*   Cardiac Enzymes: No results for input(s): CKTOTAL, CKMB, CKMBINDEX, TROPONINI in the last 168 hours. BNP (last 3 results) No results for input(s): PROBNP in the  last 8760 hours. CBG: Recent Labs  Lab 11/13/19 2206 11/14/19 0801 11/14/19 1156  GLUCAP 128* 110* 131*   D-Dimer: No results  for input(s): DDIMER in the last 72 hours. Hgb A1c: No results for input(s): HGBA1C in the last 72 hours. Lipid Profile: No results for input(s): CHOL, HDL, LDLCALC, TRIG, CHOLHDL, LDLDIRECT in the last 72 hours. Thyroid function studies: No results for input(s): TSH, T4TOTAL, T3FREE, THYROIDAB in the last 72 hours.  Invalid input(s): FREET3 Anemia work up: No results for input(s): VITAMINB12, FOLATE, FERRITIN, TIBC, IRON, RETICCTPCT in the last 72 hours. Sepsis Labs: Recent Labs  Lab 11/11/19 0902 11/12/19 0846 11/13/19 1843 11/13/19 1941 11/14/19 0333  PROCALCITON  --   --  <0.10  --  <0.10  WBC 2.9* 4.5 4.3  --  3.7*  3.8*  LATICACIDVEN  --   --  2.0* 1.3  --     Microbiology Recent Results (from the past 240 hour(s))  SARS Coronavirus 2 by RT PCR (hospital order, performed in [place] hospital lab) Nasopharyngeal Nasopharyngeal Swab     Status: None   Collection Time: 11/13/19  7:41 PM   Specimen: Nasopharyngeal Swab  Result Value Ref Range Status   SARS Coronavirus 2 NEGATIVE NEGATIVE Final    Comment: (NOTE) SARS-CoV-2 target nucleic acids are NOT DETECTED. The SARS-CoV-2 RNA is generally detectable in upper and lower respiratory specimens during the acute phase of infection. The lowest concentration of SARS-CoV-2 viral copies this assay can detect is 250 copies / mL. A negative result does not preclude SARS-CoV-2 infection and should not be used as the sole basis for treatment or other patient management decisions.  A negative result may occur with improper specimen collection / handling, submission of specimen other than nasopharyngeal swab, presence of viral mutation(s) within the areas targeted by this assay, and inadequate number of viral copies (<250 copies / mL). A negative result must be combined with  clinical observations, patient history, and epidemiological information. Fact Sheet for Patients:   StrictlyIdeas.no Fact Sheet for Healthcare Providers: BankingDealers.co.za This test is not yet approved or cleared  by the [place] FDA and has been authorized for detection and/or diagnosis of SARS-CoV-2 by FDA under an Emergency Use Authorization (EUA).  This EUA will remain in effect (meaning this test can be used) for the duration of the COVID-19 declaration under Section 564(b)(1) of the Act, 21 U.S.C. section 360bbb-3(b)(1), unless the authorization is terminated or revoked sooner. Performed at [facility], [address] [place], [place] [address]   Blood Culture (routine x 2)     Status: None (Preliminary result)   Collection Time: 11/14/19  3:39 AM   Specimen: BLOOD  Result Value Ref Range Status   Specimen Description BLOOD RIGHT ANTECUBITAL  Final   Special Requests   Final    BOTTLES DRAWN AEROBIC AND ANAEROBIC Blood Culture adequate volume   Culture   Final    NO GROWTH < 12 HOURS Performed at [facility], [address] [place], [place] [address]    Report Status PENDING  Incomplete  Blood Culture (routine x 2)     Status: None (Preliminary result)   Collection Time: 11/14/19  3:39 AM   Specimen: BLOOD  Result Value Ref Range Status   Specimen Description BLOOD LEFT ANTECUBITAL  Final   Special Requests   Final    BOTTLES DRAWN AEROBIC AND ANAEROBIC Blood Culture results may not be optimal due to an excessive volume of blood received in culture bottles   Culture   Final    NO GROWTH <12 HOURS Performed at [facility], [address] [place], [place] [address]    Report Status PENDING  Incomplete  MRSA PCR Screening     Status: None   Collection Time: 11/14/19 11:39 AM   Specimen: Nasal Mucosa; Nasopharyngeal  Result Value Ref Range Status   MRSA by PCR NEGATIVE NEGATIVE  Final    Comment:        The GeneXpert MRSA Assay (FDA approved for NASAL specimens only), is one component of a comprehensive MRSA colonization surveillance program. It is not intended to diagnose MRSA infection nor to guide or monitor treatment for MRSA infections. Performed at [facility], [address] [place], [place] [address]     Procedures and diagnostic studies:  DG Chest [facility] 1 View  Result Date: 11/13/2019 CLINICAL DATA:  Fever EXAM: PORTABLE CHEST 1 VIEW COMPARISON:  10/15/2019 FINDINGS: Right-sided chest port terminates at the level of the superior cavoatrial junction. Normal heart size. Streaky right infrahilar airspace opacity. Left lung appears clear. No pleural effusion or pneumothorax. IMPRESSION: Streaky right infrahilar airspace opacity suspicious for pneumonia. Electronically Signed   By: Davina Poke D.O.   On: 11/13/2019 19:04    Medications:   . acyclovir  400 mg Oral BID  . allopurinol  100 mg Oral Daily  . aspirin  81 mg Oral Daily  . Chlorhexidine Gluconate Cloth  6 each Topical Daily  . enoxaparin (LOVENOX) injection  40 mg Subcutaneous Q24H  . [START ON 11/15/2019] fentaNYL  1 patch Transdermal Q72H  . FLUoxetine  40 mg Oral Daily  . insulin aspart  0-15 Units Subcutaneous TID WC  . insulin aspart  0-5 Units Subcutaneous QHS  . loratadine  10 mg Oral Daily  . montelukast  10 mg Oral QHS  . pantoprazole  40 mg Oral Daily  . pomalidomide  3 mg Oral Daily   Continuous Infusions: . sodium chloride 100 mL/hr at 11/14/19 0938  . ceFEPime (MAXIPIME) IV 2 g (11/14/19 0942)  . vancomycin 1,250 mg (11/14/19 0939)     LOS: 0 days   Tynell Winchell  [facility]     11/14/2019, 1:03 PM

## 2019-11-14 NOTE — Progress Notes (Signed)
Communicated with bedside RN Stanton Kidney to let her know that I have never seen blood culture results or even that they were obtained , have asked RN to please notify MD for further orders

## 2019-11-15 ENCOUNTER — Inpatient Hospital Stay: Payer: Medicare PPO

## 2019-11-15 ENCOUNTER — Inpatient Hospital Stay: Payer: Medicare PPO | Admitting: Hematology and Oncology

## 2019-11-15 DIAGNOSIS — E118 Type 2 diabetes mellitus with unspecified complications: Secondary | ICD-10-CM

## 2019-11-15 DIAGNOSIS — R5081 Fever presenting with conditions classified elsewhere: Secondary | ICD-10-CM

## 2019-11-15 DIAGNOSIS — D709 Neutropenia, unspecified: Secondary | ICD-10-CM

## 2019-11-15 LAB — CBC WITH DIFFERENTIAL/PLATELET
Abs Immature Granulocytes: 0.01 10*3/uL (ref 0.00–0.07)
Basophils Absolute: 0 10*3/uL (ref 0.0–0.1)
Basophils Relative: 1 %
Eosinophils Absolute: 0.6 10*3/uL — ABNORMAL HIGH (ref 0.0–0.5)
Eosinophils Relative: 26 %
HCT: 24.4 % — ABNORMAL LOW (ref 36.0–46.0)
Hemoglobin: 8.4 g/dL — ABNORMAL LOW (ref 12.0–15.0)
Immature Granulocytes: 0 %
Lymphocytes Relative: 23 %
Lymphs Abs: 0.5 10*3/uL — ABNORMAL LOW (ref 0.7–4.0)
MCH: 32.6 pg (ref 26.0–34.0)
MCHC: 34.4 g/dL (ref 30.0–36.0)
MCV: 94.6 fL (ref 80.0–100.0)
Monocytes Absolute: 0.6 10*3/uL (ref 0.1–1.0)
Monocytes Relative: 24 %
Neutro Abs: 0.6 10*3/uL — ABNORMAL LOW (ref 1.7–7.7)
Neutrophils Relative %: 26 %
Platelets: 110 10*3/uL — ABNORMAL LOW (ref 150–400)
RBC: 2.58 MIL/uL — ABNORMAL LOW (ref 3.87–5.11)
RDW: 16.9 % — ABNORMAL HIGH (ref 11.5–15.5)
WBC: 2.4 10*3/uL — ABNORMAL LOW (ref 4.0–10.5)
nRBC: 0 % (ref 0.0–0.2)

## 2019-11-15 LAB — URINE CULTURE: Culture: NO GROWTH

## 2019-11-15 LAB — BASIC METABOLIC PANEL
Anion gap: 5 (ref 5–15)
BUN: 16 mg/dL (ref 8–23)
CO2: 23 mmol/L (ref 22–32)
Calcium: 8.7 mg/dL — ABNORMAL LOW (ref 8.9–10.3)
Chloride: 114 mmol/L — ABNORMAL HIGH (ref 98–111)
Creatinine, Ser: 0.86 mg/dL (ref 0.44–1.00)
GFR calc Af Amer: >60 mL/min (ref >60)
GFR calc non Af Amer: >60 mL/min (ref >60)
Glucose, Bld: 148 mg/dL — ABNORMAL HIGH (ref 70–99)
Potassium: 4.2 mmol/L (ref 3.5–5.1)
Sodium: 142 mmol/L (ref 135–145)

## 2019-11-15 LAB — GLUCOSE, CAPILLARY
Glucose-Capillary: 131 mg/dL — ABNORMAL HIGH (ref 70–99)
Glucose-Capillary: 135 mg/dL — ABNORMAL HIGH (ref 70–99)
Glucose-Capillary: 159 mg/dL — ABNORMAL HIGH (ref 70–99)
Glucose-Capillary: 152 mg/dL — ABNORMAL HIGH (ref 70–99)

## 2019-11-15 LAB — PHOSPHORUS: Phosphorus: 2.8 mg/dL (ref 2.5–4.6)

## 2019-11-15 LAB — MAGNESIUM: Magnesium: 2 mg/dL (ref 1.7–2.4)

## 2019-11-15 LAB — PATHOLOGIST SMEAR REVIEW

## 2019-11-15 MED ORDER — VANCOMYCIN HCL IN DEXTROSE 1-5 GM/200ML-% IV SOLN
1000.0000 mg | INTRAVENOUS | Status: DC
  Administered 2019-11-16: 1000 mg via INTRAVENOUS
  Filled 2019-11-15 (×2): qty 200

## 2019-11-15 MED ORDER — SODIUM CHLORIDE 0.9 % IV SOLN
2.0000 g | Freq: Three times a day (TID) | INTRAVENOUS | Status: DC
  Administered 2019-11-15 – 2019-11-16 (×3): 2 g via INTRAVENOUS
  Filled 2019-11-15 (×6): qty 2

## 2019-11-15 MED ORDER — PROMETHAZINE HCL 25 MG PO TABS
25.0000 mg | ORAL_TABLET | Freq: Four times a day (QID) | ORAL | Status: DC | PRN
  Administered 2019-11-15 – 2019-11-16 (×3): 25 mg via ORAL
  Filled 2019-11-15 (×4): qty 1

## 2019-11-15 MED ORDER — TBO-FILGRASTIM 480 MCG/0.8ML ~~LOC~~ SOSY
480.0000 ug | PREFILLED_SYRINGE | Freq: Every day | SUBCUTANEOUS | Status: DC
  Administered 2019-11-15: 480 ug via SUBCUTANEOUS
  Filled 2019-11-15: qty 0.8

## 2019-11-15 NOTE — Progress Notes (Signed)
[facility] Hematology/Oncology Progress Note  Date of admission: 11/13/2019  Hospital day:  11/15/2019  Chief Complaint: Sarah [name] is a 64 y.o. female with recurrent multiple myeloma s/p autologous stem cell transplant currently day 21 of cycle #2 daratumumab, Pomalyst, and Decadron who was admitted through the [facility] room with fever, chills, and bilateral streaky infiltrate worrisome for pneumonia  Subjective:   Feels better.  Less sore throat. Cough productive of slight yellow/green sputum.  Low grade fever.  Social History:  The patient is alone today.  Allergies:  Allergies  Allergen Reactions  . Oxycodone-Acetaminophen Anaphylaxis    Swelling and rash  . Celebrex [Celecoxib] Diarrhea  . Codeine   . Benadryl [Diphenhydramine] Palpitations  . Morphine Itching and Rash  . Ondansetron Diarrhea  . Tylenol [Acetaminophen] Itching and Rash    Scheduled Medications: . acyclovir  400 mg Oral BID  . allopurinol  100 mg Oral Daily  . aspirin  81 mg Oral Daily  . Chlorhexidine Gluconate Cloth  6 each Topical Daily  . enoxaparin (LOVENOX) injection  40 mg Subcutaneous Q24H  . fentaNYL  1 patch Transdermal Q72H  . FLUoxetine  40 mg Oral Daily  . insulin aspart  0-15 Units Subcutaneous TID WC  . insulin aspart  0-5 Units Subcutaneous QHS  . loratadine  10 mg Oral Daily  . montelukast  10 mg Oral QHS  . pantoprazole  40 mg Oral Daily  . Tbo-filgastrim (GRANIX) SQ  480 mcg Subcutaneous q1800    Review of Systems: GENERAL:  Feels better.  Fever, improved.  No chills or sweats. PERFORMANCE STATUS (ECOG):  1-2 HEENT:  Sore throat, improved.  No visual changes, runny nose, mouth sores or tenderness. Lungs:  Shortness of breath (stable).  Slight cough productive of yellow/green sputum.  No hemoptysis. Cardiac:  No chest pain, palpitations, orthopnea, or PND. GI:  Chronic nausea.  No vomiting, diarrhea, constipation, melena or hematochezia. GU:  No urgency,  frequency, dysuria, or hematuria. Musculoskeletal:  Back pain.  No joint pain.  No muscle tenderness. Extremities:  No pain or swelling. Skin:  No rashes or skin changes. Neuro:  Chronic numbness in fingertips.  No headache, weakness, balance or coordination issues. Endocrine:  Diabetes.  No thyroid issues, hot flashes or night sweats. Psych:  No mood changes, depression or anxiety. Pain:  No focal pain. Review of systems:  All other systems reviewed and found to be negative.  Physical Exam: Blood pressure 118/66, pulse 73, temperature 99.6 F (37.6 C), resp. rate 17, height 5' 2" (1.575 m), weight 152 lb 8.9 oz (69.2 kg), SpO2 100 %.  GENERAL:  Well developed, well nourished, woman sitting comfortably on the medical unit in no acute distress. MENTAL STATUS:  Alert and oriented to person, place and time. HEAD:  Curly blonde hair.  Normocephalic, atraumatic, face symmetric, no Cushingoid features. EYES:  Glasses.  Brown eyes.  Pupils equal round and reactive to light and accomodation.  No conjunctivitis or scleral icterus. ENT:  Oropharynx clear without lesion.  Tongue normal. Mucous membranes moist.  RESPIRATORY:  Clear to auscultation without rales, wheezes or rhonchi. CARDIOVASCULAR:  Regular rate and rhythm with fow murmur.  No rub or gallop. ABDOMEN:  Soft, minimally tender LLQ without guarding or rebound tenderness.  Active bowel sounds and no hepatosplenomegaly.  No masses. SKIN:  No rashes, ulcers or lesions. EXTREMITIES: No edema, no skin discoloration or tenderness.  No palpable cords. NEUROLOGICAL: Unremarkable. PSYCH:  Appropriate.   Results for orders  placed or performed during the hospital encounter of 11/13/19 (from the past 48 hour(s))  Glucose, capillary     Status: Abnormal   Collection Time: 11/13/19 10:06 PM  Result Value Ref Range   Glucose-Capillary 128 (H) 70 - 99 mg/dL    Comment: Glucose reference range applies only to samples taken after fasting for at least 8  hours.  Urine culture     Status: None   Collection Time: 11/13/19 10:59 PM   Specimen: In/Out Cath Urine  Result Value Ref Range   Specimen Description      IN/OUT CATH URINE Performed at [facility], [address], [place], [place] [address]    Special Requests      NONE Performed at [facility], [address], [place], [place] [address]    Culture      NO GROWTH Performed at [facility], [place] [address], [place], [place] [address]    Report Status 11/15/2019 FINAL   Urinalysis, Complete w Microscopic     Status: Abnormal   Collection Time: 11/13/19 10:59 PM  Result Value Ref Range   Color, Urine YELLOW (A) YELLOW   APPearance CLEAR (A) CLEAR   Specific Gravity, Urine 1.010 1.005 - 1.030   pH 5.0 5.0 - 8.0   Glucose, UA NEGATIVE NEGATIVE mg/dL   Hgb urine dipstick NEGATIVE NEGATIVE   Bilirubin Urine NEGATIVE NEGATIVE   Ketones, ur NEGATIVE NEGATIVE mg/dL   Protein, ur NEGATIVE NEGATIVE mg/dL   Nitrite NEGATIVE NEGATIVE   Leukocytes,Ua TRACE (A) NEGATIVE   WBC, UA 11-20 0 - 5 WBC/hpf   Bacteria, UA NONE SEEN NONE SEEN   Squamous Epithelial / LPF 0-5 0 - 5    Comment: Performed at [facility], [address] [place], [place] [address]  Protime-INR     Status: None   Collection Time: 11/14/19  3:33 AM  Result Value Ref Range   Prothrombin Time 14.5 11.4 - 15.2 seconds   INR 1.2 0.8 - 1.2    Comment: (NOTE) INR goal varies based on device and disease states. Performed at [facility], [address] [place], [place] [address]   Cortisol-am, blood     Status: None   Collection Time: 11/14/19  3:33 AM  Result Value Ref Range   Cortisol - AM 7.3 6.7 - 22.6 ug/dL    Comment: Performed at [facility] [address] [place], [place] [address]  Procalcitonin     Status: None   Collection Time: 11/14/19  3:33 AM  Result Value Ref Range   Procalcitonin <0.10 ng/mL    Comment:        Interpretation: PCT  (Procalcitonin) <= 0.5 ng/mL: Systemic infection (sepsis) is not likely. Local bacterial infection is possible. (NOTE)       Sepsis PCT Algorithm           Lower Respiratory Tract                                      Infection PCT Algorithm    ----------------------------     ----------------------------         PCT < 0.25 ng/mL                PCT < 0.10 ng/mL         Strongly encourage  Strongly discourage   discontinuation of antibiotics    initiation of antibiotics    ----------------------------     -----------------------------       PCT 0.25 - 0.50 ng/mL            PCT 0.10 - 0.25 ng/mL               OR       >80% decrease in PCT            Discourage initiation of                                            antibiotics      Encourage discontinuation           of antibiotics    ----------------------------     -----------------------------         PCT >= 0.50 ng/mL              PCT 0.26 - 0.50 ng/mL               AND        <80% decrease in PCT             Encourage initiation of                                             antibiotics       Encourage continuation           of antibiotics    ----------------------------     -----------------------------        PCT >= 0.50 ng/mL                  PCT > 0.50 ng/mL               AND         increase in PCT                  Strongly encourage                                      initiation of antibiotics    Strongly encourage escalation           of antibiotics                                     -----------------------------                                           PCT <= 0.25 ng/mL                                                 OR                                        >  80% decrease in PCT                                     Discontinue / Do not initiate                                             antibiotics Performed at [facility], [address] [place], [place] [address]   CBC     Status:  Abnormal   Collection Time: 11/14/19  3:33 AM  Result Value Ref Range   WBC 3.8 (L) 4.0 - 10.5 K/uL   RBC 2.63 (L) 3.87 - 5.11 MIL/uL   Hemoglobin 8.6 (L) 12.0 - 15.0 g/dL   HCT 25.4 (L) 36.0 - 46.0 %   MCV 96.6 80.0 - 100.0 fL   MCH 32.7 26.0 - 34.0 pg   MCHC 33.9 30.0 - 36.0 g/dL   RDW 16.8 (H) 11.5 - 15.5 %   Platelets 103 (L) 150 - 400 K/uL    Comment: Immature Platelet Fraction may be clinically indicated, consider ordering this additional test [id number]    nRBC Not Measured 0.0 - 0.2 %    Comment: Performed at [facility], [address], [place], [place] [address]  Basic metabolic panel     Status: Abnormal   Collection Time: 11/14/19  3:33 AM  Result Value Ref Range   Sodium 141 135 - 145 mmol/L   Potassium 3.4 (L) 3.5 - 5.1 mmol/L   Chloride 112 (H) 98 - 111 mmol/L   CO2 23 22 - 32 mmol/L   Glucose, Bld 164 (H) 70 - 99 mg/dL    Comment: Glucose reference range applies only to samples taken after fasting for at least 8 hours.   BUN 14 8 - 23 mg/dL   Creatinine, Ser 0.91 0.44 - 1.00 mg/dL   Calcium 8.0 (L) 8.9 - 10.3 mg/dL   GFR calc non Af Amer >60 >60 mL/min   GFR calc Af Amer >60 >60 mL/min   Anion gap 6 5 - 15    Comment: Performed at [facility], [address] [place], [place] [address]  CBC with Differential/Platelet     Status: Abnormal   Collection Time: 11/14/19  3:33 AM  Result Value Ref Range   WBC 3.7 (L) 4.0 - 10.5 K/uL   RBC 2.71 (L) 3.87 - 5.11 MIL/uL   Hemoglobin 8.7 (L) 12.0 - 15.0 g/dL   HCT 26.3 (L) 36.0 - 46.0 %   MCV 97.0 80.0 - 100.0 fL   MCH 32.1 26.0 - 34.0 pg   MCHC 33.1 30.0 - 36.0 g/dL   RDW 17.1 (H) 11.5 - 15.5 %   Platelets 105 (L) 150 - 400 K/uL    Comment: Immature Platelet Fraction may be clinically indicated, consider ordering this additional test [id number]    nRBC 0.0 0.0 - 0.2 %   Neutrophils Relative % 46 %   Neutro Abs 1.7 1.7 - 7.7 K/uL   Lymphocytes Relative 23 %   Lymphs Abs 0.8 0.7 - 4.0 K/uL    Monocytes Relative 23 %   Monocytes Absolute 0.9 0.1 - 1.0 K/uL   Eosinophils Relative 7 %   Eosinophils Absolute 0.2 0.0 - 0.5 K/uL   Basophils Relative 1 %   Basophils Absolute  0.0 0.0 - 0.1 K/uL   Immature Granulocytes 0 %   Abs Immature Granulocytes 0.01 0.00 - 0.07 K/uL    Comment: Performed at [facility], [address] [place], [place] [address]  Magnesium     Status: Abnormal   Collection Time: 11/14/19  3:33 AM  Result Value Ref Range   Magnesium 1.1 (L) 1.7 - 2.4 mg/dL    Comment: Performed at [facility], [address] [place], [place] [address]  Phosphorus     Status: Abnormal   Collection Time: 11/14/19  3:33 AM  Result Value Ref Range   Phosphorus 2.4 (L) 2.5 - 4.6 mg/dL    Comment: Performed at [facility], [address] [place], [place] [address]  Blood Culture (routine x 2)     Status: None (Preliminary result)   Collection Time: 11/14/19  3:39 AM   Specimen: BLOOD  Result Value Ref Range   Specimen Description BLOOD RIGHT ANTECUBITAL    Special Requests      BOTTLES DRAWN AEROBIC AND ANAEROBIC Blood Culture adequate volume   Culture      NO GROWTH 1 DAY Performed at [facility], [address], [place], [place] [address]    Report Status PENDING   Blood Culture (routine x 2)     Status: None (Preliminary result)   Collection Time: 11/14/19  3:39 AM   Specimen: BLOOD  Result Value Ref Range   Specimen Description BLOOD LEFT ANTECUBITAL    Special Requests      BOTTLES DRAWN AEROBIC AND ANAEROBIC Blood Culture results may not be optimal due to an excessive volume of blood received in culture bottles   Culture      NO GROWTH 1 DAY Performed at [facility], [address] [place], [place] [address]    Report Status PENDING   Glucose, capillary     Status: Abnormal   Collection Time: 11/14/19  8:01 AM  Result Value Ref Range   Glucose-Capillary 110 (H) 70 - 99 mg/dL    Comment: Glucose  reference range applies only to samples taken after fasting for at least 8 hours.   Comment 1 Notify RN   MRSA PCR Screening     Status: None   Collection Time: 11/14/19 11:39 AM   Specimen: Nasal Mucosa; Nasopharyngeal  Result Value Ref Range   MRSA by PCR NEGATIVE NEGATIVE    Comment:        The GeneXpert MRSA Assay (FDA approved for NASAL specimens only), is one component of a comprehensive MRSA colonization surveillance program. It is not intended to diagnose MRSA infection nor to guide or monitor treatment for MRSA infections. Performed at [facility], [address] [place], [place] [address]   Glucose, capillary     Status: Abnormal   Collection Time: 11/14/19 11:56 AM  Result Value Ref Range   Glucose-Capillary 131 (H) 70 - 99 mg/dL    Comment: Glucose reference range applies only to samples taken after fasting for at least 8 hours.   Comment 1 Notify RN   Expectorated sputum assessment w rflx to resp cult     Status: None   Collection Time: 11/14/19  3:37 PM   Specimen: Sputum  Result Value Ref Range   Specimen Description SPUTUM    Special Requests Immunocompromised    Sputum evaluation      THIS SPECIMEN IS ACCEPTABLE FOR SPUTUM CULTURE Performed at [facility], [address] [place], [place]  [address]    Report Status 11/14/2019 FINAL   Culture, respiratory     Status: None (Preliminary result)   Collection Time: 11/14/19  3:37 PM   Specimen: SPU  Result Value Ref Range   Specimen Description      SPUTUM Performed at [facility], [address] [place], [place] [address]    Special Requests      Immunocompromised Reflexed from [phone] Performed at [facility], [address] [place], [place] [address]    Gram Stain      ABUNDANT WBC PRESENT, PREDOMINANTLY PMN NO ORGANISMS SEEN    Culture      CULTURE REINCUBATED FOR BETTER GROWTH Performed at [facility], [place] [address] [place], [place]  [address]    Report Status PENDING   Glucose, capillary     Status: Abnormal   Collection Time: 11/14/19  5:03 PM  Result Value Ref Range   Glucose-Capillary 195 (H) 70 - 99 mg/dL    Comment: Glucose reference range applies only to samples taken after fasting for at least 8 hours.   Comment 1 Notify RN    Comment 2 Document in Chart   Glucose, capillary     Status: Abnormal   Collection Time: 11/14/19  9:14 PM  Result Value Ref Range   Glucose-Capillary 137 (H) 70 - 99 mg/dL    Comment: Glucose reference range applies only to samples taken after fasting for at least 8 hours.   Comment 1 Notify RN   Glucose, capillary     Status: Abnormal   Collection Time: 11/14/19  9:56 PM  Result Value Ref Range   Glucose-Capillary 133 (H) 70 - 99 mg/dL    Comment: Glucose reference range applies only to samples taken after fasting for at least 8 hours.  CBC with Differential     Status: Abnormal   Collection Time: 11/15/19  6:59 AM  Result Value Ref Range   WBC 2.4 (L) 4.0 - 10.5 K/uL   RBC 2.58 (L) 3.87 - 5.11 MIL/uL   Hemoglobin 8.4 (L) 12.0 - 15.0 g/dL   HCT 24.4 (L) 36.0 - 46.0 %   MCV 94.6 80.0 - 100.0 fL   MCH 32.6 26.0 - 34.0 pg   MCHC 34.4 30.0 - 36.0 g/dL   RDW 16.9 (H) 11.5 - 15.5 %   Platelets 110 (L) 150 - 400 K/uL    Comment: Immature Platelet Fraction may be clinically indicated, consider ordering this additional test [id number]    nRBC 0.0 0.0 - 0.2 %   Neutrophils Relative % 26 %   Neutro Abs 0.6 (L) 1.7 - 7.7 K/uL   Lymphocytes Relative 23 %   Lymphs Abs 0.5 (L) 0.7 - 4.0 K/uL   Monocytes Relative 24 %   Monocytes Absolute 0.6 0.1 - 1.0 K/uL   Eosinophils Relative 26 %   Eosinophils Absolute 0.6 (H) 0.0 - 0.5 K/uL   Basophils Relative 1 %   Basophils Absolute 0.0 0.0 - 0.1 K/uL   WBC Morphology MORPHOLOGY UNREMARKABLE    RBC Morphology MORPHOLOGY UNREMARKABLE    Smear Review MORPHOLOGY UNREMARKABLE    Immature Granulocytes 0 %   Abs Immature Granulocytes 0.01 0.00 -  0.07 K/uL    Comment: Performed at [facility], [address] [place], [place] [address]  Basic metabolic panel     Status: Abnormal   Collection Time: 11/15/19  6:59 AM  Result Value Ref Range   Sodium 142 135 - 145  mmol/L   Potassium 4.2 3.5 - 5.1 mmol/L   Chloride 114 (H) 98 - 111 mmol/L   CO2 23 22 - 32 mmol/L   Glucose, Bld 148 (H) 70 - 99 mg/dL    Comment: Glucose reference range applies only to samples taken after fasting for at least 8 hours.   BUN 16 8 - 23 mg/dL   Creatinine, Ser 0.86 0.44 - 1.00 mg/dL   Calcium 8.7 (L) 8.9 - 10.3 mg/dL   GFR calc non Af Amer >60 >60 mL/min   GFR calc Af Amer >60 >60 mL/min   Anion gap 5 5 - 15    Comment: Performed at [facility], [address] [place], [place] [address]  Magnesium     Status: None   Collection Time: 11/15/19  6:59 AM  Result Value Ref Range   Magnesium 2.0 1.7 - 2.4 mg/dL    Comment: Performed at [facility], [address] [place], [place] [address]  Phosphorus     Status: None   Collection Time: 11/15/19  6:59 AM  Result Value Ref Range   Phosphorus 2.8 2.5 - 4.6 mg/dL    Comment: Performed at [facility], [address], [place], [place] [address]  Pathologist smear review     Status: None   Collection Time: 11/15/19  6:59 AM  Result Value Ref Range   Path Review Blood smear is reviewed.     Comment: Absolute leukocytosis with neutropenia. ANC of 620 neutrophils per microliter. Normocytic anemia. Thrombocytopenia, with unremarkable morphology. Patient history of plasma cell myeloma and chemotherapy is noted.  Cytopenias likely secondary to chemotherapeutic intervention. Clinical correlation is recommended. Reviewed by Kathi Simpers, M.D. Performed at [facility], [address] [place],  [address]   Glucose, capillary     Status: Abnormal   Collection Time: 11/15/19  7:56 AM  Result Value Ref Range   Glucose-Capillary 131 (H) 70 - 99 mg/dL     Comment: Glucose reference range applies only to samples taken after fasting for at least 8 hours.  Glucose, capillary     Status: Abnormal   Collection Time: 11/15/19 11:39 AM  Result Value Ref Range   Glucose-Capillary 135 (H) 70 - 99 mg/dL    Comment: Glucose reference range applies only to samples taken after fasting for at least 8 hours.  Glucose, capillary     Status: Abnormal   Collection Time: 11/15/19  5:02 PM  Result Value Ref Range   Glucose-Capillary 159 (H) 70 - 99 mg/dL    Comment: Glucose reference range applies only to samples taken after fasting for at least 8 hours.   DG Chest 2 View  Result Date: 11/15/2019 CLINICAL DATA:  Multiple myeloma. EXAM: CHEST - 2 VIEW COMPARISON:  Nov 13, 2019. FINDINGS: The heart size and mediastinal contours are within normal limits. No pneumothorax or pleural effusion is noted. Right internal jugular Port-A-Cath is unchanged in position. Left lung is clear. Stable right infrahilar opacity is noted which may represent pneumonia. The visualized skeletal structures are unremarkable. IMPRESSION: Stable right infrahilar opacity is noted which may represent pneumonia. Electronically Signed   By: Marijo Conception M.D.   On: 11/15/2019 14:55    Assessment:  Sarah [name] is a 64 y.o. female with recurrent multiple myeloma s/p autologous stem cell transplant who was admitted through the ER with fever (103) and chills.  Events are similar to 1 month ago.    CXR on 11/13/2019 revealed  right streaky right infrahilar airspace opacity suspicious for pneumonia.  CXR today reveals stable right infrahilar opacity which may represent pneumonia.   Symptomatically, she describes a slight yllow/green productive cough.  LLQ tenderness has improved.  WBC 2400 ([place] 600).  Plan:   1.   Multiple myeloma             Patient is day 22 of cycle #2 daratumumab SQ, Pomalyst, and dexamethasone (DPd).             Pomalyst was held last week secondary to declining  counts and events of last cycle (admission for fever and neutropenia).             Pomalyst remains on hold. 2.  Fever and neutropenia  ANC 2000 to 1700 to 600.  Patient presented with fever and chills             Broad spectrum antbioticis (vancomycin and Cefepime) continue.              CXR confirms stable right streaky infrahilar airspace disease.    Sputum culture pending.   Cultures negative to date.   Fever curve improved.  Begin Granix daily secondary to drop in counts and active infection.  Neutropenic precautions.  CBC with diff daily. 3.   Chronic hypomagnesemia             Magnesium 2.0 today after supplementation yesterday.             She typically receives IV magnesium twice a week (Mondays and Thursdays). 4.   Disposition  Patient remains hospitalized.    Lequita Asal, MD  11/15/2019, 9:00 PM

## 2019-11-15 NOTE — Consult Note (Signed)
Pharmacy Antibiotic Note  Sarah [name] is a 64 y.o. female admitted on 11/13/2019 with pneumonia.  Pharmacy has been consulted for Vancomycin and Cefepime dosing.  Plan: 1) Vancomycin 1250 mg IV Q24 hours likely to result in trough <15 Will adjust dose to vancomycin 1000 mg IV q18h Ke 0.048, half life 14.3, Vd 48.4  Expected Css: 35.5/15.6   2) Increase Cefepime to 2 g IV Q8 hours for improved renal function   Will check SCr in AM and continue to follow renal function.    Height: 5\' 2"  (157.5 cm) Weight: 69.2 kg (152 lb 8.9 oz) IBW/kg (Calculated) : 50.1  Temp (24hrs), Avg:98.5 F (36.9 C), Min:97.4 F (36.3 C), Max:100.8 F (38.2 C)  Recent Labs  Lab 11/11/19 0902 11/12/19 0846 11/13/19 1843 11/13/19 1941 11/14/19 0333 11/15/19 0659  WBC 2.9* 4.5 4.3  --  3.7*  3.8* 2.4*  CREATININE  --  1.01* 1.02*  --  0.91 0.86  LATICACIDVEN  --   --  2.0* 1.3  --   --     Estimated Creatinine Clearance: 61 mL/min (by C-G formula based on SCr of 0.86 mg/dL).    Allergies  Allergen Reactions  . Oxycodone-Acetaminophen Anaphylaxis    Swelling and rash  . Celebrex [Celecoxib] Diarrhea  . Codeine   . Benadryl [Diphenhydramine] Palpitations  . Morphine Itching and Rash  . Ondansetron Diarrhea  . Tylenol [Acetaminophen] Itching and Rash    Antimicrobials this admission: Vanc/Cefepime 5/15 >>  Levofloxacin 5/15 x1  Microbiology results: 5/16 sputum cx reincubated for better growth 5/16 BCx x2 NGTD 5/15 UCx NG MRSA PCR neg  Thank you for allowing pharmacy to be a part of this patient's care.  Rocky Morel 11/15/2019 12:05 PM

## 2019-11-15 NOTE — Progress Notes (Addendum)
Progress Note    Sarah [name]  XBD:[id number] DOB: Feb 25, 1956  DOA: 11/13/2019 PCP: Glean Hess, MD      Brief Narrative:    Medical records reviewed and are as summarized below:  Sarah [name] is an 64 y.o. female with medical history significant for multiple myeloma with history of autologous stem cell transplant 2016, currently on chemotherapy with daratumumab, Pomalyst and dexamethasone, as well as history of HTN, DM, CKD 3 and mild aortic stenosis, with recent hospitalization in April 2021 for neutropenic fever, treated with broad-spectrum antibiotics but with no source identified.  She presented to the hospital because of cough, generalized weakness, sore throat, nausea that has been going on for several days.  She was febrile and tachycardic in the emergency room with temperature of 103 F.  Chest x-ray was concerning for right-sided pneumonia.      Assessment/Plan:   Principal Problem:   Sepsis ([place]) Active Problems:   Multiple myeloma not having achieved remission (HCC)   Type II diabetes mellitus with complication (HCC)   Mild aortic valve stenosis   Benign essential HTN   Stage 3 chronic kidney disease   Pneumonia   Sepsis secondary to pneumonia -Patient presented with fever of 103, starting on the day of arrival, preceded by a several day history of sore throat, nausea and generalized malaise with chest x-ray showing likely pneumonia. -Recently hospitalized in April 2021 with neutropenic fever treated with broad-spectrum antibiotics, evaluated by ID at the time Continue empiric IV antibiotics follow-up blood cultures. Obtain 2D echo for further evaluation.  Repeat chest x-ray today.    Multiple myeloma not having achieved remission ([place]) -Appreciate input from oncologist, Dr. Mike Gip -On chemotherapy.  Hold pomalidomide per oncologist.    Stage 3a chronic kidney disease Clinically stable.  Continue to monitor.  Hypokalemia and  hypomagnesemia Improved.  Monitor levels.    Type II diabetes mellitus with complication (HCC) -NovoLog as needed for hyperglycemia.  History of atrial flutter status post ablation 1999 History of moderate aortic stenosis No acute issues.  EF 50% noted on echo done in 04/2019 -Follows with Dr. Nehemiah Massed in the outpatient    Pancytopenia Asymptomatic.  Monitor CBC.  History of chronic diastolic CHF  Body mass index is 27.9 kg/m.   Family Communication/Anticipated D/C date and plan/Code Status   DVT prophylaxis: Lovenox Code Status: Full code Family Communication: Plan discussed with patient Disposition Plan:    Status is: Inpatient  Remains inpatient appropriate because:IV treatments appropriate due to intensity of illness or inability to take PO   Dispo: The patient is from: Home              Anticipated d/c is to: Home              Anticipated d/c date is: 1 day              Patient currently is not medically stable to d/c.                       Subjective:   She feels better today.  Cough is more productive today.  Sputum is greenish to yellowish in color.  Sore throat and earache in the right ear have improved.  No fever or chills.  Objective:    Vitals:   11/15/19 0754 11/15/19 1140 11/15/19 1452 11/15/19 1513  BP: 111/65   118/66  Pulse: 69   73  Resp: 17     Temp:  98.3 F (36.8 C) 98.1 F (36.7 C) 99.6 F (37.6 C) 99.6 F (37.6 C)  TempSrc: Oral     SpO2: 100%   100%  Weight:      Height:       No data found.   Intake/Output Summary (Last 24 hours) at 11/15/2019 1647 Last data filed at 11/15/2019 1100 Gross per 24 hour  Intake 1269.64 ml  Output --  Net 1269.64 ml   Filed Weights   11/13/19 1821 11/14/19 0024  Weight: 66.4 kg 69.2 kg    Exam:  GEN: NAD SKIN: Warm and dry.  No rash noted. EYES: EOMI ENT: MMM.  No discharge, mastoid or tragus tenderness noted in the ears.  No pharyngeal erythema or exudate noted.   No palpable cervical lymphadenopathy. CV: RRR PULM: CTA B ABD: soft, ND, NT, +BS CNS: AAO x 3, non focal EXT: No edema or tenderness   Data Reviewed:   I have personally reviewed following labs and imaging studies:  Labs: Labs show the following:   Basic Metabolic Panel: Recent Labs  Lab 11/11/19 0902 11/12/19 0846 11/12/19 0846 11/13/19 1843 11/13/19 1843 11/14/19 0333 11/15/19 0659  NA  --  134*  --  139  --  141 142  K  --  3.3*   < > 3.7   < > 3.4* 4.2  CL  --  101  --  107  --  112* 114*  CO2  --  22  --  23  --  23 23  GLUCOSE  --  146*  --  160*  --  164* 148*  BUN  --  23  --  15  --  14 16  CREATININE  --  1.01*  --  1.02*  --  0.91 0.86  CALCIUM  --  8.7*  --  9.0  --  8.0* 8.7*  MG 1.2*  --   --   --   --  1.1* 2.0  PHOS  --   --   --   --   --  2.4* 2.8   < > = values in this interval not displayed.   GFR Estimated Creatinine Clearance: 61 mL/min (by C-G formula based on SCr of 0.86 mg/dL). Liver Function Tests: Recent Labs  Lab 11/13/19 1843  AST 33  ALT 48*  ALKPHOS 84  BILITOT 0.9  PROT 6.3*  ALBUMIN 4.0   No results for input(s): LIPASE, AMYLASE in the last 168 hours. No results for input(s): AMMONIA in the last 168 hours. Coagulation profile Recent Labs  Lab 11/14/19 0333  INR 1.2    CBC: Recent Labs  Lab 11/11/19 0902 11/12/19 0846 11/13/19 1843 11/14/19 0333 11/15/19 0659  WBC 2.9* 4.5 4.3 3.7*  3.8* 2.4*  NEUTROABS 1.0* 2.2 2.0 1.7 0.6*  HGB 10.4* 9.9* 9.6* 8.7*  8.6* 8.4*  HCT 29.9* 28.8* 27.9* 26.3*  25.4* 24.4*  MCV 92.0 93.2 94.3 97.0  96.6 94.6  PLT 102* 108* 123* 105*  103* 110*   Cardiac Enzymes: No results for input(s): CKTOTAL, CKMB, CKMBINDEX, TROPONINI in the last 168 hours. BNP (last 3 results) No results for input(s): PROBNP in the last 8760 hours. CBG: Recent Labs  Lab 11/14/19 1703 11/14/19 2114 11/14/19 2156 11/15/19 0756 11/15/19 1139  GLUCAP 195* 137* 133* 131* 135*   D-Dimer: No  results for input(s): DDIMER in the last 72 hours. Hgb A1c: No results for input(s): HGBA1C in the last 72 hours. Lipid Profile: No results  for input(s): CHOL, HDL, LDLCALC, TRIG, CHOLHDL, LDLDIRECT in the last 72 hours. Thyroid function studies: No results for input(s): TSH, T4TOTAL, T3FREE, THYROIDAB in the last 72 hours.  Invalid input(s): FREET3 Anemia work up: No results for input(s): VITAMINB12, FOLATE, FERRITIN, TIBC, IRON, RETICCTPCT in the last 72 hours. Sepsis Labs: Recent Labs  Lab 11/12/19 0846 11/13/19 1843 11/13/19 1941 11/14/19 0333 11/15/19 0659  PROCALCITON  --  <0.10  --  <0.10  --   WBC 4.5 4.3  --  3.7*  3.8* 2.4*  LATICACIDVEN  --  2.0* 1.3  --   --     Microbiology Recent Results (from the past 240 hour(s))  SARS Coronavirus 2 by RT PCR (hospital order, performed in [place] hospital lab) Nasopharyngeal Nasopharyngeal Swab     Status: None   Collection Time: 11/13/19  7:41 PM   Specimen: Nasopharyngeal Swab  Result Value Ref Range Status   SARS Coronavirus 2 NEGATIVE NEGATIVE Final    Comment: (NOTE) SARS-CoV-2 target nucleic acids are NOT DETECTED. The SARS-CoV-2 RNA is generally detectable in upper and lower respiratory specimens during the acute phase of infection. The lowest concentration of SARS-CoV-2 viral copies this assay can detect is 250 copies / mL. A negative result does not preclude SARS-CoV-2 infection and should not be used as the sole basis for treatment or other patient management decisions.  A negative result may occur with improper specimen collection / handling, submission of specimen other than nasopharyngeal swab, presence of viral mutation(s) within the areas targeted by this assay, and inadequate number of viral copies (<250 copies / mL). A negative result must be combined with clinical observations, patient history, and epidemiological information. Fact Sheet for Patients:   StrictlyIdeas.no Fact  Sheet for Healthcare Providers: BankingDealers.co.za This test is not yet approved or cleared  by the [place] FDA and has been authorized for detection and/or diagnosis of SARS-CoV-2 by FDA under an Emergency Use Authorization (EUA).  This EUA will remain in effect (meaning this test can be used) for the duration of the COVID-19 declaration under Section 564(b)(1) of the Act, 21 U.S.C. section 360bbb-3(b)(1), unless the authorization is terminated or revoked sooner. Performed at [facility], [address] [place], [place] [address]   Urine culture     Status: None   Collection Time: 11/13/19 10:59 PM   Specimen: In/Out Cath Urine  Result Value Ref Range Status   Specimen Description   Final    IN/OUT CATH URINE Performed at [facility], [address] [place], [place] [address]    Special Requests   Final    NONE Performed at [facility], [address] [place], [place] [address]    Culture   Final    NO GROWTH Performed at [facility], [place] [address], [place], [place] [address]    Report Status 11/15/2019 FINAL  Final  Blood Culture (routine x 2)     Status: None (Preliminary result)   Collection Time: 11/14/19  3:39 AM   Specimen: BLOOD  Result Value Ref Range Status   Specimen Description BLOOD RIGHT ANTECUBITAL  Final   Special Requests   Final    BOTTLES DRAWN AEROBIC AND ANAEROBIC Blood Culture adequate volume   Culture   Final    NO GROWTH 1 DAY Performed at [facility], [address] [place], [place] [address]    Report Status PENDING  Incomplete  Blood Culture (routine x 2)  Status: None (Preliminary result)   Collection Time: 11/14/19  3:39 AM   Specimen: BLOOD  Result Value Ref Range Status   Specimen Description BLOOD LEFT ANTECUBITAL  Final   Special Requests   Final    BOTTLES DRAWN AEROBIC AND ANAEROBIC Blood Culture results may not be optimal due to an excessive volume  of blood received in culture bottles   Culture   Final    NO GROWTH 1 DAY Performed at [facility], [address], [place], [place] [address]    Report Status PENDING  Incomplete  MRSA PCR Screening     Status: None   Collection Time: 11/14/19 11:39 AM   Specimen: Nasal Mucosa; Nasopharyngeal  Result Value Ref Range Status   MRSA by PCR NEGATIVE NEGATIVE Final    Comment:        The GeneXpert MRSA Assay (FDA approved for NASAL specimens only), is one component of a comprehensive MRSA colonization surveillance program. It is not intended to diagnose MRSA infection nor to guide or monitor treatment for MRSA infections. Performed at [facility], [address] [place], [place] [address]   Expectorated sputum assessment w rflx to resp cult     Status: None   Collection Time: 11/14/19  3:37 PM   Specimen: Sputum  Result Value Ref Range Status   Specimen Description SPUTUM  Final   Special Requests Immunocompromised  Final   Sputum evaluation   Final    THIS SPECIMEN IS ACCEPTABLE FOR SPUTUM CULTURE Performed at [facility], [address] [place], [place] [address]    Report Status 11/14/2019 FINAL  Final  Culture, respiratory     Status: None (Preliminary result)   Collection Time: 11/14/19  3:37 PM   Specimen: SPU  Result Value Ref Range Status   Specimen Description   Final    SPUTUM Performed at [facility], [address], [place], [place] [address]    Special Requests   Final    Immunocompromised Reflexed from [phone] Performed at [facility], [address] [place], [place] [address]    Gram Stain   Final    ABUNDANT WBC PRESENT, PREDOMINANTLY PMN NO ORGANISMS SEEN    Culture   Final    CULTURE REINCUBATED FOR BETTER GROWTH Performed at [facility], [place] [address] [place], [place] [address]    Report Status PENDING  Incomplete    Procedures and diagnostic studies:  DG Chest 2 View  Result  Date: 11/15/2019 CLINICAL DATA:  Multiple myeloma. EXAM: CHEST - 2 VIEW COMPARISON:  Nov 13, 2019. FINDINGS: The heart size and mediastinal contours are within normal limits. No pneumothorax or pleural effusion is noted. Right internal jugular Port-A-Cath is unchanged in position. Left lung is clear. Stable right infrahilar opacity is noted which may represent pneumonia. The visualized skeletal structures are unremarkable. IMPRESSION: Stable right infrahilar opacity is noted which may represent pneumonia. Electronically Signed   By: Marijo Conception M.D.   On: 11/15/2019 14:55   DG Chest Port 1 View  Result Date: 11/13/2019 CLINICAL DATA:  Fever EXAM: PORTABLE CHEST 1 VIEW COMPARISON:  10/15/2019 FINDINGS: Right-sided chest port terminates at the level of the superior cavoatrial junction. Normal heart size. Streaky right infrahilar airspace opacity. Left lung appears clear. No pleural effusion or pneumothorax. IMPRESSION: Streaky right infrahilar airspace opacity suspicious for pneumonia. Electronically Signed   By: Davina Poke D.O.   On: 11/13/2019 19:04    Medications:   .  acyclovir  400 mg Oral BID  . allopurinol  100 mg Oral Daily  . aspirin  81 mg Oral Daily  . Chlorhexidine Gluconate Cloth  6 each Topical Daily  . enoxaparin (LOVENOX) injection  40 mg Subcutaneous Q24H  . fentaNYL  1 patch Transdermal Q72H  . FLUoxetine  40 mg Oral Daily  . insulin aspart  0-15 Units Subcutaneous TID WC  . insulin aspart  0-5 Units Subcutaneous QHS  . loratadine  10 mg Oral Daily  . montelukast  10 mg Oral QHS  . pantoprazole  40 mg Oral Daily   Continuous Infusions: . ceFEPime (MAXIPIME) IV    . [START ON 11/16/2019] vancomycin       LOS: 1 day   Abbe Bula  [facility]     11/15/2019, 4:47 PM

## 2019-11-16 ENCOUNTER — Inpatient Hospital Stay
Admit: 2019-11-16 | Discharge: 2019-11-16 | Disposition: A | Discharge status: Home / Self Care | Payer: Medicare PPO | Attending: Internal Medicine | Admitting: Internal Medicine

## 2019-11-16 LAB — GLUCOSE, CAPILLARY
Glucose-Capillary: 121 mg/dL — ABNORMAL HIGH (ref 70–99)
Glucose-Capillary: 100 mg/dL — ABNORMAL HIGH (ref 70–99)
Glucose-Capillary: 111 mg/dL — ABNORMAL HIGH (ref 70–99)

## 2019-11-16 LAB — CULTURE, RESPIRATORY W GRAM STAIN: Culture: NORMAL

## 2019-11-16 LAB — CBC WITH DIFFERENTIAL/PLATELET
Abs Immature Granulocytes: 0.02 10*3/uL (ref 0.00–0.07)
Basophils Absolute: 0.1 10*3/uL (ref 0.0–0.1)
Basophils Relative: 1 %
Eosinophils Absolute: 0.7 10*3/uL — ABNORMAL HIGH (ref 0.0–0.5)
Eosinophils Relative: 14 %
HCT: 23.2 % — ABNORMAL LOW (ref 36.0–46.0)
Hemoglobin: 8.1 g/dL — ABNORMAL LOW (ref 12.0–15.0)
Immature Granulocytes: 0 %
Lymphocytes Relative: 18 %
Lymphs Abs: 0.9 10*3/uL (ref 0.7–4.0)
MCH: 32.7 pg (ref 26.0–34.0)
MCHC: 34.9 g/dL (ref 30.0–36.0)
MCV: 93.5 fL (ref 80.0–100.0)
Monocytes Absolute: 1.1 10*3/uL — ABNORMAL HIGH (ref 0.1–1.0)
Monocytes Relative: 21 %
Neutro Abs: 2.3 10*3/uL (ref 1.7–7.7)
Neutrophils Relative %: 46 %
Platelets: 122 10*3/uL — ABNORMAL LOW (ref 150–400)
RBC: 2.48 MIL/uL — ABNORMAL LOW (ref 3.87–5.11)
RDW: 16.6 % — ABNORMAL HIGH (ref 11.5–15.5)
Smear Review: NORMAL
WBC: 5.1 10*3/uL (ref 4.0–10.5)
nRBC: 0 % (ref 0.0–0.2)

## 2019-11-16 LAB — CREATININE, SERUM
Creatinine, Ser: 0.81 mg/dL (ref 0.44–1.00)
GFR calc Af Amer: >60 mL/min (ref >60)
GFR calc non Af Amer: >60 mL/min (ref >60)

## 2019-11-16 LAB — ECHOCARDIOGRAM COMPLETE
Height: 62 in
Weight: 2440.93 oz

## 2019-11-16 LAB — CRYPTOCOCCAL ANTIGEN: Crypto Ag: NEGATIVE

## 2019-11-16 LAB — RETICULOCYTES
Immature Retic Fract: 30.4 % — ABNORMAL HIGH (ref 2.3–15.9)
RBC.: 2.47 MIL/uL — ABNORMAL LOW (ref 3.87–5.11)
Retic Count, Absolute: 65.7 10*3/uL (ref 19.0–186.0)
Retic Ct Pct: 2.7 % (ref 0.4–3.1)

## 2019-11-16 MED ORDER — LEVOFLOXACIN 500 MG PO TABS
500.0000 mg | ORAL_TABLET | Freq: Every day | ORAL | 0 refills | Status: AC
Start: 2019-11-17 — End: 2019-11-21

## 2019-11-16 MED ORDER — POLYETHYLENE GLYCOL 3350 17 G PO PACK
17.0000 g | PACK | Freq: Every day | ORAL | Status: DC
  Administered 2019-11-16: 17 g via ORAL
  Filled 2019-11-16: qty 1

## 2019-11-16 NOTE — Progress Notes (Signed)
*  PRELIMINARY RESULTS* Echocardiogram 2D Echocardiogram has been performed.  Sherrie Sport 11/16/2019, 9:42 AM

## 2019-11-16 NOTE — Discharge Summary (Signed)
Physician Discharge Summary  Sarah [name] EXH:[id number] DOB: 05/29/1956 DOA: 11/13/2019  PCP: Glean Hess, MD  Admit date: 11/13/2019 Discharge date: 11/16/2019  Discharge disposition: Home   Recommendations for Outpatient Follow-Up:   Outpatient follow-up with oncologist within 1 week of discharge   Discharge Diagnosis:   Principal Problem:   Sepsis ([place]) Active Problems:   Multiple myeloma not having achieved remission ([place])   Type II diabetes mellitus with complication ([place])   Mild aortic valve stenosis   Benign essential HTN   Stage 3 chronic kidney disease   Pneumonia    Discharge Condition: Stable.  Diet recommendation: Heart healthy diet  Code status: Full code.    Hospital Course:   Sarah [name] is an 65 y.o. female with medical history significant formultiple myeloma with history of autologous stem cell transplant 2016, currently on chemotherapy with daratumumab, Pomalyst and dexamethasone, as well as history of HTN, DM, CKD 3 and mild aortic stenosis, with recent hospitalization in April 2021 for neutropenic fever(treated at that time with broad-spectrum antibiotics but no source of infection was identified).  She presented to the hospital because of cough, generalized weakness, sore throat, nausea that had been going on for several days prior to admission.  She was febrile and tachycardic in the emergency room with temperature of 103 F.  Chest x-ray was concerning for right-sided pneumonia.  She was treated with empiric IV antibiotics and IV fluids.  2D echo was done but there was no evidence of vegetation to suggest infective endocarditis.  Her symptoms have improved.  She has been afebrile for more than 48 hours.  She was seen in consultation by an oncologist, Dr. Mike Gip.  Patient also had neutropenia and so she was treated with Granix.  Blood cultures and urine culture have not revealed any bacteria thus far.  Her condition has improved and  she is deemed stable for discharge to home.  Discharge plan was discussed with Dr. Mike Gip, oncologist, who is agreeable with the plan.  Discharge plan was also discussed with the patient who is in agreement with the plan.   Medical Consultants:    Oncologist, Dr. Mike Gip   Discharge Exam:   Vitals:   11/16/19 0731 11/16/19 1549  BP: 118/64 129/67  Pulse: 69 73  Resp: 20 15  Temp: 98.8 F (37.1 C) 99.5 F (37.5 C)  SpO2: 99% 98%   Vitals:   11/15/19 1513 11/15/19 2306 11/16/19 0731 11/16/19 1549  BP: 118/66 136/67 118/64 129/67  Pulse: 73 66 69 73  Resp:  16 20 15   Temp: 99.6 F (37.6 C) 98.6 F (37 C) 98.8 F (37.1 C) 99.5 F (37.5 C)  TempSrc:  Oral    SpO2: 100% 100% 99% 98%  Weight:      Height:         GEN: NAD SKIN: No rash EYES: EOMI ENT: MMM CV: RRR PULM: CTA B ABD: soft, ND, NT, +BS CNS: AAO x 3, non focal EXT: No edema or tenderness   The results of significant diagnostics from this hospitalization (including imaging, microbiology, ancillary and laboratory) are listed below for reference.     Procedures and Diagnostic Studies:   ECHOCARDIOGRAM COMPLETE  Result Date: 11/16/2019    ECHOCARDIOGRAM REPORT   Patient Name:   Sarah [name] Date of Exam: 11/16/2019 Medical Rec #:  [id number]        Height:       62.0 in Accession #:    [id number]  Weight:       152.6 lb Date of Birth:  July 01, 1956         BSA:          1.704 m Patient Age:    49 years         BP:           118/64 mmHg Patient Gender: F                HR:           69 bpm. Exam Location:  [facility] Procedure: 2D Echo, Cardiac Doppler and Color Doppler Indications:     Immunosuppresive -induced fever  History:         Patient has no prior history of Echocardiogram examinations.                  Risk Factors:Hypertension and Diabetes. Bicuspid AOV.  Sonographer:     Sherrie Sport RDCS (AE) Referring Phys:  [place] Diagnosing Phys: Serafina Royals MD IMPRESSIONS  1. Left ventricular  ejection fraction, by estimation, is 60 to 65%. The left ventricle has normal function. The left ventricle has no regional wall motion abnormalities. There is mild left ventricular hypertrophy. Left ventricular diastolic parameters were normal.  2. Right ventricular systolic function is normal. The right ventricular size is normal. There is mildly elevated pulmonary artery systolic pressure.  3. The mitral valve is normal in structure. Mild mitral valve regurgitation.  4. The aortic valve is normal in structure. Aortic valve regurgitation is trivial. Mild to moderate aortic valve stenosis. FINDINGS  Left Ventricle: Left ventricular ejection fraction, by estimation, is 60 to 65%. The left ventricle has normal function. The left ventricle has no regional wall motion abnormalities. The left ventricular internal cavity size was normal in size. There is  mild left ventricular hypertrophy. Left ventricular diastolic parameters were normal. Right Ventricle: The right ventricular size is normal. No increase in right ventricular wall thickness. Right ventricular systolic function is normal. There is mildly elevated pulmonary artery systolic pressure. The tricuspid regurgitant velocity is 2.82  m/s, and with an assumed right atrial pressure of 10 mmHg, the estimated right ventricular systolic pressure is 05.6 mmHg. Left Atrium: Left atrial size was normal in size. Right Atrium: Right atrial size was normal in size. Pericardium: There is no evidence of pericardial effusion. Mitral Valve: The mitral valve is normal in structure. Mild mitral valve regurgitation. Tricuspid Valve: The tricuspid valve is normal in structure. Tricuspid valve regurgitation is trivial. Aortic Valve: The aortic valve is normal in structure. Aortic valve regurgitation is trivial. Mild to moderate aortic stenosis is present. Aortic valve mean gradient measures 20.3 mmHg. Aortic valve peak gradient measures 35.9 mmHg. Aortic valve area, by  VTI measures  0.69 cm. Pulmonic Valve: The pulmonic valve was normal in structure. Pulmonic valve regurgitation is not visualized. Aorta: The aortic root and ascending aorta are structurally normal, with no evidence of dilitation. IAS/Shunts: No atrial level shunt detected by color flow Doppler.  LEFT VENTRICLE PLAX 2D LVIDd:         4.04 cm  Diastology LVIDs:         2.18 cm  LV e' lateral:   8.59 cm/s LV PW:         1.43 cm  LV E/e' lateral: 11.5 LV IVS:        0.72 cm  LV e' medial:    5.87 cm/s LVOT diam:     2.00 cm  LV E/e' medial:  16.9 LV SV:         44 LV SV Index:   26 LVOT Area:     3.14 cm  RIGHT VENTRICLE RV Basal diam:  3.23 cm RV S prime:     7.51 cm/s TAPSE (M-mode): 2.9 cm LEFT ATRIUM             Index       RIGHT ATRIUM           Index LA diam:        3.30 cm 1.94 cm/m  RA Area:     14.00 cm LA Vol (A2C):   58.4 ml 34.27 ml/m RA Volume:   30.90 ml  18.14 ml/m LA Vol (A4C):   47.3 ml 27.76 ml/m LA Biplane Vol: 53.0 ml 31.11 ml/m  AORTIC VALVE                    PULMONIC VALVE AV Area (Vmax):    0.73 cm     PV Vmax:        0.83 m/s AV Area (Vmean):   0.67 cm     PV Peak grad:   2.8 mmHg AV Area (VTI):     0.69 cm     RVOT Peak grad: 4 mmHg AV Vmax:           299.67 cm/s AV Vmean:          208.667 cm/s AV VTI:            0.635 m AV Peak Grad:      35.9 mmHg AV Mean Grad:      20.3 mmHg LVOT Vmax:         69.80 cm/s LVOT Vmean:        44.600 cm/s LVOT VTI:          0.140 m LVOT/AV VTI ratio: 0.22  AORTA Ao Root diam: 3.50 cm MITRAL VALVE               TRICUSPID VALVE MV Area (PHT): 3.23 cm    TR Peak grad:   31.8 mmHg MV Decel Time: 235 msec    TR Vmax:        282.00 cm/s MV E velocity: 99.00 cm/s MV A velocity: 93.00 cm/s  SHUNTS MV E/A ratio:  1.06        Systemic VTI:  0.14 m                            Systemic Diam: 2.00 cm Serafina Royals MD Electronically signed by Serafina Royals MD Signature Date/Time: 11/16/2019/4:01:50 PM    Final      Labs:   Basic Metabolic Panel: Recent Labs  Lab  11/11/19 0902 11/12/19 4782 11/12/19 9562 11/13/19 1843 11/13/19 1843 11/14/19 0333 11/15/19 0659 11/16/19 0532  NA  --  134*  --  139  --  141 142  --   K  --  3.3*   < > 3.7   < > 3.4* 4.2  --   CL  --  101  --  107  --  112* 114*  --   CO2  --  22  --  23  --  23 23  --   GLUCOSE  --  146*  --  160*  --  164* 148*  --   BUN  --  23  --  15  --  14 16  --   CREATININE  --  1.01*  --  1.02*  --  0.91 0.86 0.81  CALCIUM  --  8.7*  --  9.0  --  8.0* 8.7*  --   MG 1.2*  --   --   --   --  1.1* 2.0  --   PHOS  --   --   --   --   --  2.4* 2.8  --    < > = values in this interval not displayed.   GFR Estimated Creatinine Clearance: 64.8 mL/min (by C-G formula based on SCr of 0.81 mg/dL). Liver Function Tests: Recent Labs  Lab 11/13/19 1843  AST 33  ALT 48*  ALKPHOS 84  BILITOT 0.9  PROT 6.3*  ALBUMIN 4.0   No results for input(s): LIPASE, AMYLASE in the last 168 hours. No results for input(s): AMMONIA in the last 168 hours. Coagulation profile Recent Labs  Lab 11/14/19 0333  INR 1.2    CBC: Recent Labs  Lab 11/12/19 0846 11/13/19 1843 11/14/19 0333 11/15/19 0659 11/16/19 0532  WBC 4.5 4.3 3.7*  3.8* 2.4* 5.1  NEUTROABS 2.2 2.0 1.7 0.6* 2.3  HGB 9.9* 9.6* 8.7*  8.6* 8.4* 8.1*  HCT 28.8* 27.9* 26.3*  25.4* 24.4* 23.2*  MCV 93.2 94.3 97.0  96.6 94.6 93.5  PLT 108* 123* 105*  103* 110* 122*   Cardiac Enzymes: No results for input(s): CKTOTAL, CKMB, CKMBINDEX, TROPONINI in the last 168 hours. BNP: Invalid input(s): POCBNP CBG: Recent Labs  Lab 11/15/19 1139 11/15/19 1702 11/15/19 2111 11/16/19 0733 11/16/19 1146  GLUCAP 135* 159* 152* 121* 100*   D-Dimer No results for input(s): DDIMER in the last 72 hours. Hgb A1c No results for input(s): HGBA1C in the last 72 hours. Lipid Profile No results for input(s): CHOL, HDL, LDLCALC, TRIG, CHOLHDL, LDLDIRECT in the last 72 hours. Thyroid function studies No results for input(s): TSH, T4TOTAL, T3FREE,  THYROIDAB in the last 72 hours.  Invalid input(s): FREET3 Anemia work up [employer]    11/16/19 0532  RETICCTPCT 2.7   Microbiology Recent Results (from the past 240 hour(s))  SARS Coronavirus 2 by RT PCR (hospital order, performed in [facility] hospital lab) Nasopharyngeal Nasopharyngeal Swab     Status: None   Collection Time: 11/13/19  7:41 PM   Specimen: Nasopharyngeal Swab  Result Value Ref Range Status   SARS Coronavirus 2 NEGATIVE NEGATIVE Final    Comment: (NOTE) SARS-CoV-2 target nucleic acids are NOT DETECTED. The SARS-CoV-2 RNA is generally detectable in upper and lower respiratory specimens during the acute phase of infection. The lowest concentration of SARS-CoV-2 viral copies this assay can detect is 250 copies / mL. A negative result does not preclude SARS-CoV-2 infection and should not be used as the sole basis for treatment or other patient management decisions.  A negative result may occur with improper specimen collection / handling, submission of specimen other than nasopharyngeal swab, presence of viral mutation(s) within the areas targeted by this assay, and inadequate number of viral copies (<250 copies / mL). A negative result must be combined with clinical observations, patient history, and epidemiological information. Fact Sheet for Patients:   StrictlyIdeas.no Fact Sheet for Healthcare Providers: BankingDealers.co.za This test is not yet approved or cleared  by the [place] FDA and has been authorized for detection and/or diagnosis of SARS-CoV-2 by FDA under an Emergency Use Authorization (EUA).  This EUA will remain in effect (meaning this test can  be used) for the duration of the COVID-19 declaration under Section 564(b)(1) of the Act, 21 U.S.C. section 360bbb-3(b)(1), unless the authorization is terminated or revoked sooner. Performed at [facility], [address],  [place], [place] [address]   Urine culture     Status: None   Collection Time: 11/13/19 10:59 PM   Specimen: In/Out Cath Urine  Result Value Ref Range Status   Specimen Description   Final    IN/OUT CATH URINE Performed at [facility], [address], [place], [place] [address]    Special Requests   Final    NONE Performed at [facility], [address] [place], [place] [address]    Culture   Final    NO GROWTH Performed at [facility], [place] [address], [place], [place] [address]    Report Status 11/15/2019 FINAL  Final  Blood Culture (routine x 2)     Status: None (Preliminary result)   Collection Time: 11/14/19  3:39 AM   Specimen: BLOOD  Result Value Ref Range Status   Specimen Description BLOOD RIGHT ANTECUBITAL  Final   Special Requests   Final    BOTTLES DRAWN AEROBIC AND ANAEROBIC Blood Culture adequate volume   Culture   Final    NO GROWTH 2 DAYS Performed at [facility], [address], [place], [place] [address]    Report Status PENDING  Incomplete  Blood Culture (routine x 2)     Status: None (Preliminary result)   Collection Time: 11/14/19  3:39 AM   Specimen: BLOOD  Result Value Ref Range Status   Specimen Description BLOOD LEFT ANTECUBITAL  Final   Special Requests   Final    BOTTLES DRAWN AEROBIC AND ANAEROBIC Blood Culture results may not be optimal due to an excessive volume of blood received in culture bottles   Culture   Final    NO GROWTH 2 DAYS Performed at [facility], [address] [place], [place] [address]    Report Status PENDING  Incomplete  MRSA PCR Screening     Status: None   Collection Time: 11/14/19 11:39 AM   Specimen: Nasal Mucosa; Nasopharyngeal  Result Value Ref Range Status   MRSA by PCR NEGATIVE NEGATIVE Final    Comment:        The GeneXpert MRSA Assay (FDA approved for NASAL specimens only), is one component of a comprehensive MRSA colonization surveillance program. It is  not intended to diagnose MRSA infection nor to guide or monitor treatment for MRSA infections. Performed at [facility], [address] [place], [place] [address]   Expectorated sputum assessment w rflx to resp cult     Status: None   Collection Time: 11/14/19  3:37 PM   Specimen: Sputum  Result Value Ref Range Status   Specimen Description SPUTUM  Final   Special Requests Immunocompromised  Final   Sputum evaluation   Final    THIS SPECIMEN IS ACCEPTABLE FOR SPUTUM CULTURE Performed at [facility], [address] [place], [place] [address]    Report Status 11/14/2019 FINAL  Final  Culture, respiratory     Status: None   Collection Time: 11/14/19  3:37 PM   Specimen: SPU  Result Value Ref Range Status   Specimen Description   Final    SPUTUM Performed at [facility], [address], [place],  [address]    Special Requests   Final    Immunocompromised Reflexed from  [id number] Performed at [facility], [address] [place], [place] [address]    Gram Stain   Final    ABUNDANT WBC PRESENT, PREDOMINANTLY PMN NO ORGANISMS SEEN    Culture   Final    Consistent with normal respiratory flora. Performed at [facility], [place] [address] [place], [place] [address]    Report Status 11/16/2019 FINAL  Final     Discharge Instructions:   Discharge Instructions    Diet - low sodium heart healthy   Complete by: As directed    Discharge instructions   Complete by: As directed    Follow up with oncologist, Dr. Mike Gip regarding when to resume Pomalidomide   Increase activity slowly   Complete by: As directed      Allergies as of 11/16/2019      Reactions   Oxycodone-acetaminophen Anaphylaxis   Swelling and rash   Celebrex [celecoxib] Diarrhea   Codeine    Benadryl [diphenhydramine] Palpitations   Morphine Itching, Rash   Ondansetron Diarrhea   Tylenol [acetaminophen] Itching, Rash      Medication List    STOP  taking these medications   pomalidomide 3 MG capsule Commonly known as: Pomalyst   pravastatin 20 MG tablet Commonly known as: PRAVACHOL     TAKE these medications   acetaminophen 500 MG tablet Commonly known as: TYLENOL Take 500 mg by mouth every 6 (six) hours as needed.   acyclovir 400 MG tablet Commonly known as: ZOVIRAX Take 1 tablet (400 mg total) by mouth 2 (two) times daily.   allopurinol 100 MG tablet Commonly known as: ZYLOPRIM TAKE 1 TABLET(100 MG) BY MOUTH DAILY as needed   aspirin 81 MG chewable tablet Chew 1 tablet (81 mg total) by mouth daily.   dexamethasone 4 MG tablet Commonly known as: DECADRON Take 5 tablets (95m) on days 2, 9, 16, 23 of cycle 1 and 2 Take 5 tablets (245m on days 2, 16 of cycle 3,4,5 and 6 Take 5 tablets (2017mon days 2 of cycles 7 and beyond   diclofenac sodium 1 % Gel Commonly known as: VOLTAREN Apply 2 g topically 4 (four) times daily.   diphenhydrAMINE 50 MG capsule Commonly known as: BENADRYL Take 50 mg by mouth as needed.   diphenoxylate-atropine 2.5-0.025 MG tablet Commonly known as: LOMOTIL Take 2 tablets by mouth daily as needed for diarrhea or loose stools.   fentaNYL 25 MCG/HR Commonly known as: DURAGESIC Place 25 mcg onto the skin every 3 (three) days.   fexofenadine 180 MG tablet Commonly known as: ALLEGRA TAKE 1 TABLET(180 MG) BY MOUTH DAILY   FLUoxetine 40 MG capsule Commonly known as: PROZAC TAKE 1 CAPSULE EVERY DAY   fluticasone 50 MCG/ACT nasal spray Commonly known as: FLONASE Place 2 sprays into both nostrils daily.   furosemide 20 MG tablet Commonly known as: LASIX Take 20 mg by mouth as needed.   HAIR/SKIN/NAILS/BIOTIN PO Take 1 capsule by mouth 3 (three) times daily.   hydrOXYzine 10 MG tablet Commonly known as: ATARAX/VISTARIL TAKE 1 TABLET(10 MG) BY MOUTH THREE TIMES DAILY AS NEEDED   lactulose 10 GM/15ML solution Commonly known as: CHRONULAC   levofloxacin 500 MG tablet Commonly  known as: Levaquin Take 1 tablet (500 mg total) by mouth daily for 4 days. Start taking on: Nov 17, 2019   lidocaine-prilocaine cream Commonly known as: EMLA APPLY TOPICALLY AS NEEDED.   metFORMIN 500 MG 24 hr tablet Commonly known as: GLUCOPHAGE-XR Take 1 tablet (500 mg  total) by mouth daily with breakfast.   montelukast 10 MG tablet Commonly known as: Singulair Take 1 tablet by mouth on the day before treatment and for 2 days after treatment.   Narcan 4 MG/0.1ML Liqd nasal spray kit Generic drug: naloxone 1 spray.   Omega 3-6-9 Complex Caps Take by mouth.   omeprazole 40 MG capsule Commonly known as: PRILOSEC Take 1 capsule (40 mg total) by mouth daily.   ONE TOUCH ULTRA TEST test strip Generic drug: glucose blood   OSTEO BI-FLEX ADV TRIPLE ST PO Take 2 tablets by mouth daily.   promethazine 25 MG tablet Commonly known as: PHENERGAN Take 1 tablet (25 mg total) by mouth every 6 (six) hours as needed for nausea or vomiting.   promethazine-dextromethorphan 6.25-15 MG/5ML syrup Commonly known as: PROMETHAZINE-DM Take 5 mLs by mouth 4 (four) times daily as needed for cough.   tiZANidine 4 MG tablet Commonly known as: ZANAFLEX Take 1 tablet (4 mg total) by mouth every 8 (eight) hours as needed for muscle spasms.   traZODone 100 MG tablet Commonly known as: DESYREL TAKE 1 TABLET AT BEDTIME  FOR  SLEEP         Time coordinating discharge: 28 minutes  Signed:  Emonee Winkowski  [facility] 11/16/2019, 4:32 PM

## 2019-11-16 NOTE — Progress Notes (Signed)
Received MD order to discharge patient to home, reviewed discharge instructions, prescriptions, follow up appointments and home meds with patient and patient verbalized understanding.  Per MD and patient port was left accessed for patient appointment at cancer center this Thursday.  Stressed importance of patient hygiene around port area

## 2019-11-17 ENCOUNTER — Other Ambulatory Visit: Payer: Self-pay | Admitting: *Deleted

## 2019-11-17 LAB — TOXOPLASMA ANTIBODIES- IGG AND  IGM
Toxoplasma Antibody- IgM: <3 AU/mL (ref 0.0–7.9)
Toxoplasma IgG Ratio: 8.2 IU/mL — ABNORMAL HIGH (ref 0.0–7.1)

## 2019-11-18 ENCOUNTER — Inpatient Hospital Stay: Payer: Medicare PPO

## 2019-11-18 ENCOUNTER — Other Ambulatory Visit: Payer: Self-pay | Admitting: Hematology and Oncology

## 2019-11-18 ENCOUNTER — Other Ambulatory Visit: Payer: Self-pay

## 2019-11-18 ENCOUNTER — Encounter: Payer: Self-pay | Admitting: Hematology and Oncology

## 2019-11-18 ENCOUNTER — Inpatient Hospital Stay (HOSPITAL_BASED_OUTPATIENT_CLINIC_OR_DEPARTMENT_OTHER): Payer: Medicare PPO | Admitting: Hematology and Oncology

## 2019-11-18 VITALS — BP 131/76 | HR 106 | Temp 96.9°F | Resp 16 | Wt 145.9 lb

## 2019-11-18 VITALS — BP 112/74 | HR 99 | Temp 97.4°F | Resp 18

## 2019-11-18 DIAGNOSIS — Z7952 Long term (current) use of systemic steroids: Secondary | ICD-10-CM | POA: Diagnosis not present

## 2019-11-18 DIAGNOSIS — Z5112 Encounter for antineoplastic immunotherapy: Secondary | ICD-10-CM | POA: Diagnosis not present

## 2019-11-18 DIAGNOSIS — E538 Deficiency of other specified B group vitamins: Secondary | ICD-10-CM | POA: Diagnosis not present

## 2019-11-18 DIAGNOSIS — E1122 Type 2 diabetes mellitus with diabetic chronic kidney disease: Secondary | ICD-10-CM | POA: Diagnosis not present

## 2019-11-18 DIAGNOSIS — K5900 Constipation, unspecified: Secondary | ICD-10-CM | POA: Diagnosis not present

## 2019-11-18 DIAGNOSIS — R05 Cough: Secondary | ICD-10-CM

## 2019-11-18 DIAGNOSIS — R252 Cramp and spasm: Secondary | ICD-10-CM | POA: Diagnosis not present

## 2019-11-18 DIAGNOSIS — D709 Neutropenia, unspecified: Secondary | ICD-10-CM | POA: Diagnosis not present

## 2019-11-18 DIAGNOSIS — J029 Acute pharyngitis, unspecified: Secondary | ICD-10-CM | POA: Diagnosis not present

## 2019-11-18 DIAGNOSIS — E8889 Other specified metabolic disorders: Secondary | ICD-10-CM | POA: Diagnosis not present

## 2019-11-18 DIAGNOSIS — G620 Drug-induced polyneuropathy: Secondary | ICD-10-CM | POA: Diagnosis not present

## 2019-11-18 DIAGNOSIS — R21 Rash and other nonspecific skin eruption: Secondary | ICD-10-CM | POA: Diagnosis not present

## 2019-11-18 DIAGNOSIS — C9000 Multiple myeloma not having achieved remission: Secondary | ICD-10-CM

## 2019-11-18 DIAGNOSIS — J189 Pneumonia, unspecified organism: Secondary | ICD-10-CM

## 2019-11-18 DIAGNOSIS — R110 Nausea: Secondary | ICD-10-CM | POA: Diagnosis not present

## 2019-11-18 DIAGNOSIS — T451X5A Adverse effect of antineoplastic and immunosuppressive drugs, initial encounter: Secondary | ICD-10-CM | POA: Diagnosis not present

## 2019-11-18 DIAGNOSIS — M549 Dorsalgia, unspecified: Secondary | ICD-10-CM | POA: Diagnosis not present

## 2019-11-18 DIAGNOSIS — I129 Hypertensive chronic kidney disease with stage 1 through stage 4 chronic kidney disease, or unspecified chronic kidney disease: Secondary | ICD-10-CM | POA: Diagnosis not present

## 2019-11-18 DIAGNOSIS — R5383 Other fatigue: Secondary | ICD-10-CM | POA: Diagnosis not present

## 2019-11-18 DIAGNOSIS — M1990 Unspecified osteoarthritis, unspecified site: Secondary | ICD-10-CM | POA: Diagnosis not present

## 2019-11-18 DIAGNOSIS — D649 Anemia, unspecified: Secondary | ICD-10-CM | POA: Diagnosis not present

## 2019-11-18 DIAGNOSIS — R059 Cough, unspecified: Secondary | ICD-10-CM

## 2019-11-18 DIAGNOSIS — N183 Chronic kidney disease, stage 3 unspecified: Secondary | ICD-10-CM | POA: Diagnosis not present

## 2019-11-18 DIAGNOSIS — R0602 Shortness of breath: Secondary | ICD-10-CM | POA: Diagnosis not present

## 2019-11-18 DIAGNOSIS — E860 Dehydration: Secondary | ICD-10-CM | POA: Diagnosis not present

## 2019-11-18 DIAGNOSIS — R197 Diarrhea, unspecified: Secondary | ICD-10-CM | POA: Diagnosis not present

## 2019-11-18 LAB — CBC WITH DIFFERENTIAL/PLATELET
Abs Immature Granulocytes: 0.31 10*3/uL — ABNORMAL HIGH (ref 0.00–0.07)
Basophils Absolute: 0.1 10*3/uL (ref 0.0–0.1)
Basophils Relative: 1 %
Eosinophils Absolute: 0.4 10*3/uL (ref 0.0–0.5)
Eosinophils Relative: 7 %
HCT: 28.8 % — ABNORMAL LOW (ref 36.0–46.0)
Hemoglobin: 9.9 g/dL — ABNORMAL LOW (ref 12.0–15.0)
Immature Granulocytes: 6 %
Lymphocytes Relative: 14 %
Lymphs Abs: 0.8 10*3/uL (ref 0.7–4.0)
MCH: 32.4 pg (ref 26.0–34.0)
MCHC: 34.4 g/dL (ref 30.0–36.0)
MCV: 94.1 fL (ref 80.0–100.0)
Monocytes Absolute: 0.7 10*3/uL (ref 0.1–1.0)
Monocytes Relative: 13 %
Neutro Abs: 3.1 10*3/uL (ref 1.7–7.7)
Neutrophils Relative %: 59 %
Platelets: 177 10*3/uL (ref 150–400)
RBC: 3.06 MIL/uL — ABNORMAL LOW (ref 3.87–5.11)
RDW: 17.2 % — ABNORMAL HIGH (ref 11.5–15.5)
WBC: 5.3 10*3/uL (ref 4.0–10.5)
nRBC: 0.4 % — ABNORMAL HIGH (ref 0.0–0.2)

## 2019-11-18 LAB — BASIC METABOLIC PANEL
Anion gap: 13 (ref 5–15)
BUN: 25 mg/dL — ABNORMAL HIGH (ref 8–23)
CO2: 21 mmol/L — ABNORMAL LOW (ref 22–32)
Calcium: 9 mg/dL (ref 8.9–10.3)
Chloride: 103 mmol/L (ref 98–111)
Creatinine, Ser: 1.28 mg/dL — ABNORMAL HIGH (ref 0.44–1.00)
GFR calc Af Amer: 52 mL/min — ABNORMAL LOW (ref >60)
GFR calc non Af Amer: 44 mL/min — ABNORMAL LOW (ref >60)
Glucose, Bld: 174 mg/dL — ABNORMAL HIGH (ref 70–99)
Potassium: 3.8 mmol/L (ref 3.5–5.1)
Sodium: 137 mmol/L (ref 135–145)

## 2019-11-18 LAB — MAGNESIUM: Magnesium: 1.1 mg/dL — ABNORMAL LOW (ref 1.7–2.4)

## 2019-11-18 LAB — FUNGITELL, SERUM: Fungitell Result: <31 pg/mL (ref <80)

## 2019-11-18 MED ORDER — HEPARIN SOD (PORK) LOCK FLUSH 100 UNIT/ML IV SOLN
500.0000 [IU] | Freq: Once | INTRAVENOUS | Status: AC | PRN
  Administered 2019-11-18: 500 [IU]
  Filled 2019-11-18: qty 5

## 2019-11-18 MED ORDER — SODIUM CHLORIDE 0.9% FLUSH
10.0000 mL | Freq: Once | INTRAVENOUS | Status: AC | PRN
  Administered 2019-11-18: 10 mL
  Filled 2019-11-18: qty 10

## 2019-11-18 MED ORDER — SODIUM CHLORIDE 0.9 % IV SOLN
Freq: Once | INTRAVENOUS | Status: AC
  Filled 2019-11-18: qty 250

## 2019-11-18 MED ORDER — POMALIDOMIDE 3 MG PO CAPS
3.0000 mg | ORAL_CAPSULE | Freq: Every day | ORAL | 0 refills | Status: DC
Start: 2019-11-18 — End: 2019-12-11

## 2019-11-18 MED ORDER — PROMETHAZINE-DM 6.25-15 MG/5ML PO SYRP: 5.0000 mL | ORAL_SOLUTION | Freq: Four times a day (QID) | ORAL | 0 refills | Status: DC | PRN

## 2019-11-18 MED ORDER — MAGNESIUM SULFATE 4 GM/100ML IV SOLN
4.0000 g | Freq: Once | INTRAVENOUS | Status: AC
  Administered 2019-11-18: 4 g via INTRAVENOUS

## 2019-11-18 MED ORDER — CYANOCOBALAMIN 1000 MCG/ML IJ SOLN
1000.0000 ug | Freq: Once | INTRAMUSCULAR | Status: AC
  Administered 2019-11-18: 1000 ug via INTRAMUSCULAR

## 2019-11-18 NOTE — Progress Notes (Signed)
[facility]  [address] [place], [place] [address] Phone: [phone]  Fax: [phone]    Clinic Day:  11/18/2019  Referring physician: Glean Hess, MD  Chief Complaint: Sarah [name] is a 64 y.o. 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  HPI:   The patient was last seen in the medical oncology clinic on 11/12/2019. At that time, she was doing well. Nausea was controlled. Bowels had improved with fiber. Mouth dryness had improved with Biotin. Exam revealed paraspinal tenderness. Hematocrit 27.7, hemoglobin is 9.6, platelets 90,000, WBC 3500 (ANC 2200). CMP was normal. Magnesium was 1.1. She received day 15 of cycle #2 daratumumab SQ, Pomalyst, and dexamethasone (DPd). She received 4 mg IV magnesium.   Labs on 11/11/2019 included hematocrit 29.9, hemoglobin 10.4, platelets 102,000, WBC 2,900 (ANC 1,000).  Magnesium was 1.2.   Pomalyst was held.  Patient received IV magnesium 4 g and Zarxio.   She was admitted to [facility] from 11/13/2019 - 11/16/2019 with sepsis.  She presented with fever (103), chills, tachycardia, and general weakness.  CXR revealed a right sided infrahilar opacity c/w pneumonia.  She received broad spectrum antibiotics.  Echocardiogram revealed no evidence of vegetations to suggest endocarditis.  ANC nadir was 600.  She received Granix.  Cryptococcal antigen was negative.  Toxoplasma gondii IgG was 8.2 (0-7.1).  Fungitell is pending.  She was discharged on Levaquin.  Symptomatically, she is feeling weak and tired. Patient feels like this is the weakest she has felt in a long time. She continues to have a productive cough with yellow sputum. She has upper back pain (5/10) between the shoulder blade (new). She has been having headaches. She is nauseated and constipated. She notes needing antibiotics for a genital yeast  infection.    Past Medical History:  Diagnosis Date  . Abnormal stress test 02/14/2016   Overview:  Added automatically from request for surgery [id number]  . Anemia   . Anxiety   . Arthritis   . Bicuspid aortic valve   . CHF (congestive heart failure) ([place])   . CKD (chronic kidney disease) stage 3, GFR 30-59 ml/min   . Depression   . Diabetes mellitus ([place])   . Dizziness   . Fatty liver   . Frequent falls   . GERD (gastroesophageal reflux disease)   . Gout   . Heart murmur   . History of blood transfusion   . History of bone marrow transplant ([place])   . History of uterine fibroid   . Hx of cardiac catheterization 06/05/2016   Overview:  Normal coronaries 2017  . Hypertension   . Hypomagnesemia   . Multiple myeloma ([place])   . Personal history of chemotherapy   . Renal cyst     Past Surgical History:  Procedure Laterality Date  . ABDOMINAL HYSTERECTOMY    . 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 CARDIAC ELECTROPHYSIOLOGY MAPPING AND ABLATION    . CARPAL TUNNEL RELEASE Bilateral   . CHOLECYSTECTOMY  2008  . COLONOSCOPY WITH PROPOFOL N/A 05/07/2017   Procedure: COLONOSCOPY WITH PROPOFOL;  Surgeon: [name], MD;  Location: [facility] ENDOSCOPY;  Service: Gastroenterology;  Laterality: N/A;  . ESOPHAGOGASTRODUODENOSCOPY (EGD) WITH PROPOFOL N/A 05/07/2017   Procedure: ESOPHAGOGASTRODUODENOSCOPY (EGD) WITH PROPOFOL;  Surgeon: [name], MD;  Location: [facility] ENDOSCOPY;  Service: Gastroenterology;  Laterality: N/A;  . FOOT SURGERY Bilateral   .  INCONTINENCE SURGERY  2009  . INTERSTIM IMPLANT PLACEMENT    . other     over active bladder  . OTHER SURGICAL HISTORY     bladder stimulator   . PARTIAL HYSTERECTOMY  03/1996   fibroids  . PORTA CATH INSERTION N/A 03/10/2019   Procedure: PORTA CATH INSERTION;  Surgeon: Algernon Huxley, MD;  Location: [place] CV LAB;  Service: Cardiovascular;  Laterality: N/A;  . TONSILLECTOMY  2007    Family History  Problem Relation Age of Onset  .  Colon cancer Father   . Renal Disease Father   . Diabetes Mellitus II Father   . Melanoma Paternal Grandmother   . Breast cancer Maternal Aunt 41  . Anemia Mother   . Heart disease Mother   . Heart failure Mother   . Renal Disease Mother   . Congestive Heart Failure Mother   . Heart disease Maternal Uncle   . Throat cancer Maternal Uncle   . Lung cancer Maternal Uncle   . Liver disease Maternal Uncle   . Heart failure Maternal Uncle   . Hearing loss Son 28       Suicide     Social History:  reports that she quit smoking about 28 years ago. Her smoking use included cigarettes. She has a 20.00 pack-year smoking history. She has never used smokeless tobacco. She reports previous alcohol use. She reports that she does not use drugs. Patient has not had ETOH in several months. She is on disability. She notes exposure to perchloroethylene [facility]).She lives much of her adult life in [place]. She was married with 2 sons. Her husband passed away. Her 2 sons took their own lives (age 65 and 50). She worked in [employer] in [employer]. She went back to school and earned an [employer] and works for [employer] for several years.She [place] sistersin [place]. The patient is alone today.  Allergies:  Allergies  Allergen Reactions  . Oxycodone-Acetaminophen Anaphylaxis    Swelling and rash  . Celebrex [Celecoxib] Diarrhea  . Codeine   . Benadryl [Diphenhydramine] Palpitations  . Morphine Itching and Rash  . Ondansetron Diarrhea  . Tylenol [Acetaminophen] Itching and Rash    Current Medications: Current Outpatient Medications  Medication Sig Dispense Refill  . acetaminophen (TYLENOL) 500 MG tablet Take 500 mg by mouth every 6 (six) hours as needed.    Marland Kitchen acyclovir (ZOVIRAX) 400 MG tablet Take 1 tablet (400 mg total) by mouth 2 (two) times daily. 60 tablet 11  . allopurinol (ZYLOPRIM) 100 MG tablet TAKE 1 TABLET(100 MG) BY MOUTH DAILY as needed 90 tablet 1  .  aspirin 81 MG chewable tablet Chew 1 tablet (81 mg total) by mouth daily. 30 tablet 0  . dexamethasone (DECADRON) 4 MG tablet Take 5 tablets (42m) on days 2, 9, 16, 23 of cycle 1 and 2 Take 5 tablets (2105m on days 2, 16 of cycle 3,4,5 and 6 Take 5 tablets (2057mon days 2 of cycles 7 and beyond 120 tablet 0  . diclofenac sodium (VOLTAREN) 1 % GEL Apply 2 g topically 4 (four) times daily. 100 g 1  . diphenhydrAMINE (BENADRYL) 50 MG capsule Take 50 mg by mouth as needed.    . diphenoxylate-atropine (LOMOTIL) 2.5-0.025 MG tablet Take 2 tablets by mouth daily as needed for diarrhea or loose stools. 45 tablet 2  . fentaNYL (DURAGESIC - DOSED MCG/HR) 25 MCG/HR patch Place 25 mcg onto the skin every 3 (three) days.     .Marland Kitchen  fexofenadine (ALLEGRA) 180 MG tablet TAKE 1 TABLET(180 MG) BY MOUTH DAILY 30 tablet 5  . FLUoxetine (PROZAC) 40 MG capsule TAKE 1 CAPSULE EVERY DAY 90 capsule 1  . fluticasone (FLONASE) 50 MCG/ACT nasal spray Place 2 sprays into both nostrils daily. 16 g 2  . furosemide (LASIX) 20 MG tablet Take 20 mg by mouth as needed.     Marland Kitchen glucose blood (ONE TOUCH ULTRA TEST) test strip     . hydrOXYzine (ATARAX/VISTARIL) 10 MG tablet TAKE 1 TABLET(10 MG) BY MOUTH THREE TIMES DAILY AS NEEDED 90 tablet 0  . lactulose (CHRONULAC) 10 GM/15ML solution     . levofloxacin (LEVAQUIN) 500 MG tablet Take 1 tablet (500 mg total) by mouth daily for 4 days. 4 tablet 0  . lidocaine-prilocaine (EMLA) cream APPLY TOPICALLY AS NEEDED. 30 g 0  . metFORMIN (GLUCOPHAGE-XR) 500 MG 24 hr tablet Take 1 tablet (500 mg total) by mouth daily with breakfast. 90 tablet 1  . Misc Natural Products (OSTEO BI-FLEX ADV TRIPLE ST PO) Take 2 tablets by mouth daily.    . montelukast (SINGULAIR) 10 MG tablet Take 1 tablet by mouth on the day before treatment and for 2 days after treatment. 30 tablet 0  . NARCAN 4 MG/0.1ML LIQD nasal spray kit 1 spray.    Marland Kitchen omeprazole (PRILOSEC) 40 MG capsule Take 1 capsule (40 mg total) by mouth  daily. 90 capsule 1  . pomalidomide (POMALYST) 3 MG capsule Take 1 capsule (3 mg total) by mouth daily. [employer] Auth # [id number]     Date Obtained 11/18/2019 21 capsule 0  . promethazine (PHENERGAN) 25 MG tablet Take 1 tablet (25 mg total) by mouth every 6 (six) hours as needed for nausea or vomiting. 90 tablet 0  . promethazine-dextromethorphan (PROMETHAZINE-DM) 6.25-15 MG/5ML syrup Take 5 mLs by mouth 4 (four) times daily as needed for cough. 240 mL 0  . tiZANidine (ZANAFLEX) 4 MG tablet Take 1 tablet (4 mg total) by mouth every 8 (eight) hours as needed for muscle spasms. 270 tablet 1  . traZODone (DESYREL) 100 MG tablet TAKE 1 TABLET AT BEDTIME  FOR  SLEEP 90 tablet 1  . Multiple Vitamins-Minerals (HAIR/SKIN/NAILS/BIOTIN PO) Take 1 capsule by mouth 3 (three) times daily.    . Omega 3-6-9 Fatty Acids (OMEGA 3-6-9 COMPLEX) CAPS Take by mouth.     No current facility-administered medications for this visit.    Review of Systems  Constitutional: Positive for malaise/fatigue (majority of the time). Negative for chills, diaphoresis, fever and weight loss (stable).       Doing "better".  HENT: Negative for congestion, ear discharge, ear pain, hearing loss, nosebleeds, sinus pain, sore throat and tinnitus.        Using biotene mouth wash for improve dry mouth.  Eyes: Negative for blurred vision.  Respiratory: Positive for cough (productive) and sputum production (yellow). Negative for hemoptysis and shortness of breath (exertional).   Cardiovascular: Negative for chest pain, palpitations and leg swelling.  Gastrointestinal: Positive for constipation and nausea (chronic s/p gallbladder surgery; controlled with medication). Negative for abdominal pain, blood in stool, diarrhea, heartburn (controlled on medication), melena and vomiting.       Normal bowels. Early satiety. Eating more fiber.  Genitourinary: Negative for dysuria, frequency, hematuria and urgency.       Bladder leakage and spasms. Yeast  infection.  Musculoskeletal: Positive for back pain (chronic; paraspinal tenderness; upper back (5/10)). Negative for joint pain (arthritis), myalgias (leg cramps; more frequent) and neck  pain.  Skin: Negative for itching and rash.       Blisters on right hand, resolved.  Neurological: Positive for sensory change (chronic numbness in fingers are better; toes are worse with intermittent pain), weakness and headaches. Negative for dizziness and tingling.       Feeling woozy from Benadryl.  Endo/Heme/Allergies: Does not bruise/bleed easily.       Diabetes.  Psychiatric/Behavioral: Negative for depression and memory loss. The patient is not nervous/anxious (with associated shakiness in the mornings) and does not have insomnia.   All other systems reviewed and are negative.  Performance status (ECOG): 2  Vitals Blood pressure 131/76, pulse (!) 106, temperature (!) 96.9 F (36.1 C), temperature source Tympanic, resp. rate 16, weight 145 lb 15.1 oz (66.2 kg), SpO2 99 %.   Physical Exam  Constitutional: She is oriented to person, place, and time. She appears well-developed and well-nourished. No distress.  HENT:  Head: Normocephalic and atraumatic.  Mouth/Throat: Oropharynx is clear and moist. No oropharyngeal exudate.  Curly dark blonde hair. Dentures. Mouth dry. Mask.  Eyes: Pupils are equal, round, and reactive to light. Conjunctivae and EOM are normal. No scleral icterus.  Glasses. Brown eyes.  Cardiovascular: Normal rate, regular rhythm and normal heart sounds.  No murmur heard. Pulmonary/Chest: Effort normal and breath sounds normal. No respiratory distress. She has no wheezes. She has no rales. She exhibits no tenderness.  Abdominal: Soft. Bowel sounds are normal. She exhibits no distension and no mass. There is no abdominal tenderness. There is no rebound and no guarding.  Musculoskeletal:        General: No tenderness or edema. Normal range of motion.     Cervical back: Normal range  of motion and neck supple.  Lymphadenopathy:       Head (right side): No preauricular, no posterior auricular and no occipital adenopathy present.       Head (left side): No preauricular, no posterior auricular and no occipital adenopathy present.    She has no cervical adenopathy.    She has no axillary adenopathy.       Right: No supraclavicular adenopathy present.       Left: No supraclavicular adenopathy present.  Neurological: She is alert and oriented to person, place, and time.  Skin: Skin is warm and dry. She is not diaphoretic.  Psychiatric: She has a normal mood and affect. Her behavior is normal. Judgment and thought content normal.  Nursing note and vitals reviewed.     Infusion on 11/18/2019  Component Date Value Ref Range Status  . Sodium 11/18/2019 137  135 - 145 mmol/L Final  . Potassium 11/18/2019 3.8  3.5 - 5.1 mmol/L Final  . Chloride 11/18/2019 103  98 - 111 mmol/L Final  . CO2 11/18/2019 21* 22 - 32 mmol/L Final  . Glucose, Bld 11/18/2019 174* 70 - 99 mg/dL Final   Glucose reference range applies only to samples taken after fasting for at least 8 hours.  . BUN 11/18/2019 25* 8 - 23 mg/dL Final  . Creatinine, Ser 11/18/2019 1.28* 0.44 - 1.00 mg/dL Final  . Calcium 11/18/2019 9.0  8.9 - 10.3 mg/dL Final  . GFR calc non Af Amer 11/18/2019 44* >60 mL/min Final  . GFR calc Af Amer 11/18/2019 52* >60 mL/min Final  . Anion gap 11/18/2019 13  5 - 15 Final   Performed at [facility], [address], [place], [place] [address]  . Magnesium 11/18/2019 1.1* 1.7 - 2.4 mg/dL Final  Performed at [facility], [address], [place], [place] [address]  . WBC 11/18/2019 5.3  4.0 - 10.5 K/uL Final  . RBC 11/18/2019 3.06* 3.87 - 5.11 MIL/uL Final  . Hemoglobin 11/18/2019 9.9* 12.0 - 15.0 g/dL Final  . HCT 11/18/2019 28.8* 36.0 - 46.0 % Final  . MCV 11/18/2019 94.1  80.0 - 100.0 fL Final  . MCH 11/18/2019 32.4  26.0 - 34.0 pg Final  . MCHC 11/18/2019  34.4  30.0 - 36.0 g/dL Final  . RDW 11/18/2019 17.2* 11.5 - 15.5 % Final  . Platelets 11/18/2019 177  150 - 400 K/uL Final  . nRBC 11/18/2019 0.4* 0.0 - 0.2 % Final   Performed at [facility] [address], [place], [place] [address]  . Neutrophils Relative % 11/18/2019 PENDING  % Incomplete  . Neutro Abs 11/18/2019 PENDING  1.7 - 7.7 K/uL Incomplete  . Band Neutrophils 11/18/2019 PENDING  % Incomplete  . Lymphocytes Relative 11/18/2019 PENDING  % Incomplete  . Lymphs Abs 11/18/2019 PENDING  0.7 - 4.0 K/uL Incomplete  . Monocytes Relative 11/18/2019 PENDING  % Incomplete  . Monocytes Absolute 11/18/2019 PENDING  0.1 - 1.0 K/uL Incomplete  . Eosinophils Relative 11/18/2019 PENDING  % Incomplete  . Eosinophils Absolute 11/18/2019 PENDING  0.0 - 0.5 K/uL Incomplete  . Basophils Relative 11/18/2019 PENDING  % Incomplete  . Basophils Absolute 11/18/2019 PENDING  0.0 - 0.1 K/uL Incomplete  . WBC Morphology 11/18/2019 PENDING   Incomplete  . RBC Morphology 11/18/2019 PENDING   Incomplete  . Smear Review 11/18/2019 PENDING   Incomplete  . Other 11/18/2019 PENDING  % Incomplete  . nRBC 11/18/2019 PENDING  0 /100 WBC Incomplete  . Metamyelocytes Relative 11/18/2019 PENDING  % Incomplete  . Myelocytes 11/18/2019 PENDING  % Incomplete  . Promyelocytes Relative 11/18/2019 PENDING  % Incomplete  . Blasts 11/18/2019 PENDING  % Incomplete  . Immature Granulocytes 11/18/2019 PENDING  % Incomplete  . Abs Immature Granulocytes 11/18/2019 PENDING  0.00 - 0.07 K/uL Incomplete  No results displayed because visit has over 200 results.      Assessment:  Arlen Legendre is a 64 y.o. 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 Bone marrow revealed 80% plasma cells. Lambda free light chains were 1340. She had nephrotic range proteinuria. She initially underwent induction with RVD. Revlimid  maintenance was discontinued on 01/21/2017 secondary to intolerance.   Bone marrowaspirate andbiopsyon 01/18/2021revealed anormocellularmarrow withbut increased lambda-restricted plasma cells (9% aspirate, 40% CD138 immunohistochemistry).Findingswereconsistent with recurrent plasma cell myeloma.Flow cytometry revealed no monoclonal B-cell or phenotypically aberrant T-cell population. Cytogeneticswere 53, XX (normal). 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  M-spikehas been followed: 0 on 04/02/2016 -06/23/2019; and0.2 on 09/02/2017.  Lambda light chainshave been followed: 22.2 (ratio 0.56) on 07/03/2017, 30.8 (ratio 0.78) on 09/02/2017, 36.9 (ratio 0.40) on 10/21/2017, 37.4 (ratio 0.41) on 12/16/2017, 70.7(ratio 0.31) on 02/17/2018, 64.2 (ratio 0.27) on 04/07/2018, 78.9 (ratio 0.18) on 05/26/2018, 128.8 (ratio 0.17) on 08/06/2018, 181.5 (ratio 0.13) on 10/08/2018, 130.9 (ratio 0.13) on 10/20/2018, 160.7 (ratio 0.10)on 12/09/2018, 236.6 (ratio 0.07) on 02/01/2019, 363.6 (ratio 0.04) on 03/22/2019, 404.8 (ratio 0.04) on 04/05/2019, 420.7 (ratio 0.03) on 05/24/2019, 573.4 (ratio 0.03) on 06/23/2019, 451.05(ratio 0.02) on02/19/2021, and 47.2 (ratio 0.13) on 10/25/2019.  24 hour UPEPon 06/03/2019 revealed kappa free light chains95.76,lambda free light chains1,260.71, andratio 0.08.24 hourUPEPon 02/22/2021revealed totalproteinof745m/24 hrs withlambdafree light chains 1,084.176mL  andratioof0.10(1.03-31.76). M spike inurinewas46.1%(324m/24 hrs).  Bone surveyon 04/08/2016 and11/28/2018 revealed no definite lytic lesion seen in the visualized skeleton.Bone surveyon 11/19/2018 revealed no suspicious lucent lesionsand no acute bony abnormality.PET scanon 07/12/2019 revealed no focal metabolic activity to suggest active myeloma within the skeleton. There wereno lytic lesions identified on the CT portion of the examorsoft tissue  plasmacytomas. There was no evidence of multiple myeloma.  Pretreatment RBC phenotypeon 09/23/2019 waspositivefor C, e, DUFFY B, KIDD B, M, S, and s antigen; negativefor c, E, KELL, DUFFY A, KIDD A, and N antigen.  Sheis day 25 ofcycle #2 daratumumab and hyaluronidase-fihj, Pomalyst, and Decadron (DPd)(09/27/2019 - 10/25/2019).  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 Zometa was discontinued in 01/2017. She has chronic nauseaon Phenergan.  She has B12 deficiency. B12 was 254 on 04/09/2017, 295 on 08/20/2018, and 391 on 10/08/2018. Shewasonoral B12.She received B12 monthly (last04/29/2021).Folate was18.6on 02/08/2019.  She has iron deficiency. Ferritin was 32 on 07/01/2019. She received Venoferon 07/15/2019 and 07/22/2019.  [facility].  Cultures were negative.  CXR was negative. She received broad spectrum antibiotics and daily Granix.  She received IVF for acute renal insufficiency due to diarrhea and dehydration.  Creatine was 1.66 on admission and 1.12 on discharge.  [facility]  She presented with fever (103), chills, tachycardia, and general weakness.  CXR revealed a right sided infrahilar opacity c/w pneumonia.  She received broad spectrum antibiotics.  Echo revealed no evidence of vegetations to suggest endocarditis.  ANC nadir was 600.  She received Granix.  Cryptococcal antigen was negative.  Toxoplasma gondii IgG was 8.2 (0-7.1).  Fungitell is pending.  She was discharged on Levaquin.  She received her second COVID-19vaccineon 10/09/2019.  Symptomatically, he is feeling weak and tired.  She has a productive cough with yellow sputum.  She has chronic nausea.  He has a genital yeast infection.  Plan: 1.   Labs today: CBC with diff, BMP, Mg. 2. Stage III multiple myeloma Clinically,  she  is fatigued. Lambda free light chains have improved from451.05on02/19/2021to 47.2 after cycle #1. She is day 25 of cycle #2 daratumumab plus Pomalyst and dexamethasone (DPd).             She began cycle #2 Pomlayst on 10/26/2019.  He has been admitted for her second episode of infection (fever neutropenia/sepsis) with the first 2 cycles.   Hold treatment this week.  Treatment plan:    Pomalidomide (Pomalyst) 3 mg po q day 1-21.   Decadron 20 mg po day 1-2, day 8-9, 15-16, and 22-23 with cycle #1 and 2. Decadron 20 mg po on days 1-2 and 15-16 x 4 cycles (if needed). Daratumumab and hyaluronidase-fihj (Darzalez Faspro) 1800 mg SQ on days 1, 8, 15, and 22. Premeds for [employer]  Singulair 10 mg po day before, day of, and 2 days. Ondansetron 8 mg po, Benadryl 50 mg po, and Tylenol 650 mg po.  Discuss symptom management.  She has antiemetics at home to use on a prn bases.  Interventions are adequate.    3. Normocytic anemia Hematocrit28.8. Hemoglobin9.9. MCV91.7.Platelets177,000today. Ferritin128 on 10/04/2019. TSH was normal on02/20/2020  Creatinine 1.10(CrCl54.911mminute). Anemia secondary to chemotherapy.  Continue to monitor. 4. Grade II peripheral neuropathy Etiologysecondary toprior chemotherapy. Neuropathy appears stable  Continue to monitor. 5.B12 deficiency Continue B12 monthly(next due 11/25/2019). Folate was18.6on 02/08/2019. Folate annually. 6. Hypomagnesemia, chronic Magnesium1.1  today. Magnesium 4 g IV today. Continue IV magnesium  supplementation twice a week (Mondays and Thursdays).  7.Nausea Continue Phenergan.  Patient received oral Phenergan at home and IV Phenergan in clinic. 8.RTC on 05/24 and 05/27 for labs (Mg) and +/- Mg. 9.   Patient to receive B12 on 11/25/2019. 10.   RTC on 11/30/2019 for MD assessment, labs (CBC with diff, CMP, Mg, SPEP, FLCA), and day 1 of cycle #3 daratumumab SQ and IV Mg.  I discussed the assessment and treatment plan with the patient.  The patient was provided an opportunity to ask questions and all were answered.  The patient agreed with the plan and demonstrated an understanding of the instructions.  The patient was advised to call back if the symptoms worsen or if the condition fails to improve as anticipated.   Lequita Asal, MD, PhD    11/18/2019, 9:37 AM  I, Selena Batten, am acting as scribe for [employer]. Mike Gip, MD, PhD.  I, Melissa C. Mike Gip, MD, have reviewed the above documentation for accuracy and completeness, and I agree with the above.

## 2019-11-18 NOTE — Progress Notes (Signed)
This is patients first outpatient visit since recent hospitalization.  She reports feeling weak and tired.  Also still having a productive cough.  Requesting a refill on cough syrup.  Back pain 5/10 today, new upper back/between shoulder blade.  7 lb wt loss.

## 2019-11-18 NOTE — Patient Instructions (Signed)

## 2019-11-19 ENCOUNTER — Other Ambulatory Visit: Payer: Self-pay | Admitting: *Deleted

## 2019-11-19 DIAGNOSIS — C9000 Multiple myeloma not having achieved remission: Secondary | ICD-10-CM

## 2019-11-19 LAB — CULTURE, BLOOD (ROUTINE X 2)
Culture: NO GROWTH
Culture: NO GROWTH
Special Requests: ADEQUATE

## 2019-11-21 ENCOUNTER — Other Ambulatory Visit: Payer: Self-pay | Admitting: Internal Medicine

## 2019-11-21 DIAGNOSIS — K219 Gastro-esophageal reflux disease without esophagitis: Secondary | ICD-10-CM

## 2019-11-21 NOTE — Telephone Encounter (Signed)
Requested Prescriptions  Pending Prescriptions Disp Refills  . omeprazole (PRILOSEC) 40 MG capsule [Pharmacy Med Name: OMEPRAZOLE 40 MG Capsule Delayed Release] 90 capsule 3    Sig: TAKE 1 CAPSULE EVERY DAY     Gastroenterology: Proton Pump Inhibitors Passed - 11/21/2019  5:08 AM      Passed - Valid encounter within last 12 months    Recent Outpatient Visits          3 weeks ago Benign essential HTN   [facility] Glean Hess, MD   2 months ago Acute non-recurrent sinusitis, unspecified location   [facility] Glean Hess, MD   10 months ago Fullness of supraclavicular fossa   [facility] Glean Hess, MD   12 months ago Benign essential HTN   [facility] Glean Hess, MD   1 year ago Grief reaction   [facility] Glean Hess, MD      Future Appointments            In 1 week Army Melia Jesse Sans, MD [facility], [facility]

## 2019-11-22 ENCOUNTER — Other Ambulatory Visit: Payer: Self-pay

## 2019-11-22 ENCOUNTER — Inpatient Hospital Stay: Payer: Medicare PPO

## 2019-11-22 DIAGNOSIS — T451X5A Adverse effect of antineoplastic and immunosuppressive drugs, initial encounter: Secondary | ICD-10-CM | POA: Diagnosis not present

## 2019-11-22 DIAGNOSIS — C9000 Multiple myeloma not having achieved remission: Secondary | ICD-10-CM

## 2019-11-22 DIAGNOSIS — D649 Anemia, unspecified: Secondary | ICD-10-CM | POA: Diagnosis not present

## 2019-11-22 DIAGNOSIS — G620 Drug-induced polyneuropathy: Secondary | ICD-10-CM | POA: Diagnosis not present

## 2019-11-22 DIAGNOSIS — Z5112 Encounter for antineoplastic immunotherapy: Secondary | ICD-10-CM | POA: Diagnosis not present

## 2019-11-22 DIAGNOSIS — R110 Nausea: Secondary | ICD-10-CM | POA: Diagnosis not present

## 2019-11-22 DIAGNOSIS — E8889 Other specified metabolic disorders: Secondary | ICD-10-CM | POA: Diagnosis not present

## 2019-11-22 DIAGNOSIS — E538 Deficiency of other specified B group vitamins: Secondary | ICD-10-CM | POA: Diagnosis not present

## 2019-11-22 LAB — CBC WITH DIFFERENTIAL/PLATELET
Abs Immature Granulocytes: 0.04 10*3/uL (ref 0.00–0.07)
Basophils Absolute: 0.1 10*3/uL (ref 0.0–0.1)
Basophils Relative: 1 %
Eosinophils Absolute: 0.1 10*3/uL (ref 0.0–0.5)
Eosinophils Relative: 1 %
HCT: 27.6 % — ABNORMAL LOW (ref 36.0–46.0)
Hemoglobin: 9.4 g/dL — ABNORMAL LOW (ref 12.0–15.0)
Immature Granulocytes: 1 %
Lymphocytes Relative: 22 %
Lymphs Abs: 0.8 10*3/uL (ref 0.7–4.0)
MCH: 32.4 pg (ref 26.0–34.0)
MCHC: 34.1 g/dL (ref 30.0–36.0)
MCV: 95.2 fL (ref 80.0–100.0)
Monocytes Absolute: 0.3 10*3/uL (ref 0.1–1.0)
Monocytes Relative: 7 %
Neutro Abs: 2.3 10*3/uL (ref 1.7–7.7)
Neutrophils Relative %: 68 %
Platelets: 188 10*3/uL (ref 150–400)
RBC: 2.9 MIL/uL — ABNORMAL LOW (ref 3.87–5.11)
RDW: 17.4 % — ABNORMAL HIGH (ref 11.5–15.5)
WBC: 3.5 10*3/uL — ABNORMAL LOW (ref 4.0–10.5)
nRBC: 0 % (ref 0.0–0.2)

## 2019-11-22 LAB — COMPREHENSIVE METABOLIC PANEL
ALT: 31 U/L (ref 0–44)
AST: 28 U/L (ref 15–41)
Albumin: 4.4 g/dL (ref 3.5–5.0)
Alkaline Phosphatase: 83 U/L (ref 38–126)
Anion gap: 10 (ref 5–15)
BUN: 22 mg/dL (ref 8–23)
CO2: 23 mmol/L (ref 22–32)
Calcium: 9.1 mg/dL (ref 8.9–10.3)
Chloride: 106 mmol/L (ref 98–111)
Creatinine, Ser: 1.16 mg/dL — ABNORMAL HIGH (ref 0.44–1.00)
GFR calc Af Amer: 58 mL/min — ABNORMAL LOW (ref >60)
GFR calc non Af Amer: 50 mL/min — ABNORMAL LOW (ref >60)
Glucose, Bld: 159 mg/dL — ABNORMAL HIGH (ref 70–99)
Potassium: 3.5 mmol/L (ref 3.5–5.1)
Sodium: 139 mmol/L (ref 135–145)
Total Bilirubin: 0.3 mg/dL (ref 0.3–1.2)
Total Protein: 6.9 g/dL (ref 6.5–8.1)

## 2019-11-22 LAB — MAGNESIUM: Magnesium: 1.2 mg/dL — ABNORMAL LOW (ref 1.7–2.4)

## 2019-11-22 MED ORDER — MAGNESIUM SULFATE 4 GM/100ML IV SOLN
4.0000 g | Freq: Once | INTRAVENOUS | Status: AC
  Administered 2019-11-22: 4 g via INTRAVENOUS

## 2019-11-22 MED ORDER — FLUCONAZOLE 150 MG PO TABS: 150.0000 mg | ORAL_TABLET | Freq: Every day | ORAL | 0 refills | Status: DC

## 2019-11-22 MED ORDER — SODIUM CHLORIDE 0.9 % IV SOLN
Freq: Once | INTRAVENOUS | Status: AC
  Filled 2019-11-22: qty 250

## 2019-11-22 MED ORDER — HEPARIN SOD (PORK) LOCK FLUSH 100 UNIT/ML IV SOLN
500.0000 [IU] | Freq: Once | INTRAVENOUS | Status: AC
  Administered 2019-11-22: 500 [IU] via INTRAVENOUS
  Filled 2019-11-22: qty 5

## 2019-11-25 ENCOUNTER — Other Ambulatory Visit: Payer: Self-pay

## 2019-11-25 ENCOUNTER — Inpatient Hospital Stay: Payer: Medicare PPO

## 2019-11-25 VITALS — BP 118/80 | HR 105 | Temp 98.9°F | Resp 18

## 2019-11-25 DIAGNOSIS — Z5112 Encounter for antineoplastic immunotherapy: Secondary | ICD-10-CM | POA: Diagnosis not present

## 2019-11-25 DIAGNOSIS — T451X5A Adverse effect of antineoplastic and immunosuppressive drugs, initial encounter: Secondary | ICD-10-CM | POA: Diagnosis not present

## 2019-11-25 DIAGNOSIS — E8889 Other specified metabolic disorders: Secondary | ICD-10-CM | POA: Diagnosis not present

## 2019-11-25 DIAGNOSIS — C9000 Multiple myeloma not having achieved remission: Secondary | ICD-10-CM | POA: Diagnosis not present

## 2019-11-25 DIAGNOSIS — E538 Deficiency of other specified B group vitamins: Secondary | ICD-10-CM | POA: Diagnosis not present

## 2019-11-25 DIAGNOSIS — G620 Drug-induced polyneuropathy: Secondary | ICD-10-CM | POA: Diagnosis not present

## 2019-11-25 DIAGNOSIS — D649 Anemia, unspecified: Secondary | ICD-10-CM | POA: Diagnosis not present

## 2019-11-25 DIAGNOSIS — R110 Nausea: Secondary | ICD-10-CM | POA: Diagnosis not present

## 2019-11-25 LAB — CBC WITH DIFFERENTIAL/PLATELET
Abs Immature Granulocytes: 0.02 10*3/uL (ref 0.00–0.07)
Basophils Absolute: 0.1 10*3/uL (ref 0.0–0.1)
Basophils Relative: 2 %
Eosinophils Absolute: 0.1 10*3/uL (ref 0.0–0.5)
Eosinophils Relative: 1 %
HCT: 28.3 % — ABNORMAL LOW (ref 36.0–46.0)
Hemoglobin: 9.7 g/dL — ABNORMAL LOW (ref 12.0–15.0)
Immature Granulocytes: 0 %
Lymphocytes Relative: 14 %
Lymphs Abs: 0.7 10*3/uL (ref 0.7–4.0)
MCH: 32.4 pg (ref 26.0–34.0)
MCHC: 34.3 g/dL (ref 30.0–36.0)
MCV: 94.6 fL (ref 80.0–100.0)
Monocytes Absolute: 0.3 10*3/uL (ref 0.1–1.0)
Monocytes Relative: 5 %
Neutro Abs: 4 10*3/uL (ref 1.7–7.7)
Neutrophils Relative %: 78 %
Platelets: 177 10*3/uL (ref 150–400)
RBC: 2.99 MIL/uL — ABNORMAL LOW (ref 3.87–5.11)
RDW: 17.2 % — ABNORMAL HIGH (ref 11.5–15.5)
WBC: 5.1 10*3/uL (ref 4.0–10.5)
nRBC: 0 % (ref 0.0–0.2)

## 2019-11-25 LAB — MAGNESIUM: Magnesium: 1.1 mg/dL — ABNORMAL LOW (ref 1.7–2.4)

## 2019-11-25 MED ORDER — SODIUM CHLORIDE 0.9 % IV SOLN
Freq: Once | INTRAVENOUS | Status: AC
  Filled 2019-11-25: qty 250

## 2019-11-25 MED ORDER — MAGNESIUM SULFATE 2 GM/50ML IV SOLN
2.0000 g | Freq: Once | INTRAVENOUS | Status: AC
  Administered 2019-11-25: 2 g via INTRAVENOUS

## 2019-11-25 MED ORDER — SODIUM CHLORIDE 0.9% FLUSH
10.0000 mL | INTRAVENOUS | Status: DC | PRN
  Administered 2019-11-25: 10 mL via INTRAVENOUS
  Filled 2019-11-25: qty 10

## 2019-11-25 MED ORDER — HEPARIN SOD (PORK) LOCK FLUSH 100 UNIT/ML IV SOLN
500.0000 [IU] | Freq: Once | INTRAVENOUS | Status: AC
  Administered 2019-11-25: 500 [IU] via INTRAVENOUS
  Filled 2019-11-25: qty 5

## 2019-11-25 MED ORDER — MAGNESIUM SULFATE 4 GM/100ML IV SOLN
4.0000 g | Freq: Once | INTRAVENOUS | Status: AC
  Administered 2019-11-25: 4 g via INTRAVENOUS

## 2019-11-25 MED ORDER — MAGNESIUM SULFATE 2 GM/50ML IV SOLN
INTRAVENOUS | Status: AC
  Filled 2019-11-25: qty 50

## 2019-11-25 MED ORDER — MAGNESIUM SULFATE 4 GM/100ML IV SOLN
INTRAVENOUS | Status: AC
  Filled 2019-11-25: qty 100

## 2019-11-25 NOTE — Progress Notes (Signed)
Patient unable to come back to clinic until next Thursday for her supportive Magnesium infusions. MD ordered another 2 mg Magnesium today to equal a total of 6 grams of magnesium IV today.

## 2019-11-26 LAB — KAPPA/LAMBDA LIGHT CHAINS
Kappa free light chain: 4.6 mg/L (ref 3.3–19.4)
Kappa, lambda light chain ratio: 0.21 — ABNORMAL LOW (ref 0.26–1.65)
Lambda free light chains: 22.4 mg/L (ref 5.7–26.3)

## 2019-11-30 ENCOUNTER — Encounter: Payer: Self-pay | Admitting: Internal Medicine

## 2019-11-30 ENCOUNTER — Ambulatory Visit (INDEPENDENT_AMBULATORY_CARE_PROVIDER_SITE_OTHER): Payer: Medicare PPO | Admitting: Internal Medicine

## 2019-11-30 ENCOUNTER — Other Ambulatory Visit: Payer: Self-pay | Admitting: Internal Medicine

## 2019-11-30 ENCOUNTER — Other Ambulatory Visit: Payer: Self-pay

## 2019-11-30 VITALS — BP 112/72 | HR 88 | Temp 98.9°F | Ht 62.0 in | Wt 142.0 lb

## 2019-11-30 DIAGNOSIS — B373 Candidiasis of vulva and vagina: Secondary | ICD-10-CM

## 2019-11-30 DIAGNOSIS — Z1231 Encounter for screening mammogram for malignant neoplasm of breast: Secondary | ICD-10-CM | POA: Diagnosis not present

## 2019-11-30 DIAGNOSIS — T458X5A Adverse effect of other primarily systemic and hematological agents, initial encounter: Secondary | ICD-10-CM

## 2019-11-30 DIAGNOSIS — E041 Nontoxic single thyroid nodule: Secondary | ICD-10-CM | POA: Diagnosis not present

## 2019-11-30 DIAGNOSIS — M87180 Osteonecrosis due to drugs, jaw: Secondary | ICD-10-CM | POA: Diagnosis not present

## 2019-11-30 DIAGNOSIS — J189 Pneumonia, unspecified organism: Secondary | ICD-10-CM | POA: Diagnosis not present

## 2019-11-30 DIAGNOSIS — I10 Essential (primary) hypertension: Secondary | ICD-10-CM

## 2019-11-30 DIAGNOSIS — C9000 Multiple myeloma not having achieved remission: Secondary | ICD-10-CM | POA: Diagnosis not present

## 2019-11-30 DIAGNOSIS — E118 Type 2 diabetes mellitus with unspecified complications: Secondary | ICD-10-CM | POA: Diagnosis not present

## 2019-11-30 DIAGNOSIS — Z0000 Encounter for general adult medical examination without abnormal findings: Secondary | ICD-10-CM | POA: Diagnosis not present

## 2019-11-30 DIAGNOSIS — B3731 Acute candidiasis of vulva and vagina: Secondary | ICD-10-CM

## 2019-11-30 LAB — IMMUNOFIXATION REFLEX, SERUM
IgA: 19 mg/dL — ABNORMAL LOW (ref 87–352)
IgG (Immunoglobin G), Serum: 245 mg/dL — ABNORMAL LOW (ref 586–1602)
IgM (Immunoglobulin M), Srm: 6 mg/dL — ABNORMAL LOW (ref 26–217)

## 2019-11-30 LAB — PROTEIN ELECTROPHORESIS, SERUM, WITH REFLEX
A/G Ratio: 1.5 (ref 0.7–1.7)
Albumin ELP: 3.6 g/dL (ref 2.9–4.4)
Alpha-1-Globulin: 0.2 g/dL (ref 0.0–0.4)
Alpha-2-Globulin: 1 g/dL (ref 0.4–1.0)
Beta Globulin: 0.9 g/dL (ref 0.7–1.3)
Gamma Globulin: 0.3 g/dL — ABNORMAL LOW (ref 0.4–1.8)
Globulin, Total: 2.4 g/dL (ref 2.2–3.9)
M-Spike, %: 0.1 g/dL — ABNORMAL HIGH
SPEP Interpretation: 0
Total Protein ELP: 6 g/dL (ref 6.0–8.5)

## 2019-11-30 MED ORDER — FLUCONAZOLE 100 MG PO TABS: 100.0000 mg | ORAL_TABLET | Freq: Every day | ORAL | 0 refills | Status: DC

## 2019-11-30 MED ORDER — LEVOFLOXACIN 500 MG PO TABS: 500.0000 mg | ORAL_TABLET | Freq: Every day | ORAL | 0 refills | Status: AC

## 2019-11-30 NOTE — Progress Notes (Signed)
[facility]  [address], [address] [place], [place] [address] Phone: [phone]  Fax: [phone]   Clinic Day:  12/02/2019  Referring physician: Glean Hess, MD  Chief Complaint: Sarah [name] is a 64 y.o. 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  HPI: The patient was last seen in the medical oncology clinic on 11/18/2019. At that time, she felt weak and tired after a recent admission admission for sepsis.  He was on Levaquin for a pneumonia.  He described nausea and constipation.  She had a genital yeast infection.  Hematocrit was 28.8, hemoglobin 9.9, platelets 177,000, WBC 5,300. Creatinine was 1.28.  Magnesium was 1.1.  She received 4 gm of magnesium and monthly B12.  She received a prescription for fluconazole 150 mg p.o. x1.  Decision was made to postpone cycle #3 for 1 week to allow full recovery.  Labs followed: 11/22/2019: Hematocrit 27.6, hemoglobin 9.4, platelets 188,000, WBC 3,500 (ANC 2,300). Creatinine was 1.16.  Magnesium was 1.2.  11/25/2019: Hematocrit 28.3, hemoglobin 9.7, platelets 177,000, WBC 5,100. Magnesium was 1.1. Lambda light chains 22.4 (ratio 0.21). M-spike 0.1 gm/dL.   Patient received 4 gm of IV magnesium on 11/22/2019 and 6 gm of IV magnesium on 11/25/2019.   She was seen by Dr. Army Melia on 11/30/2019 for annual exam.  She was feeling poorly.  She complained of cough and shortness of breath.  Decision was made to extend her Levaquin by additional 5 days.  She received fluconazole 100 mg p.o. daily x 3 days.  During the interim, she was feeling ok. She has allergies with an associated sinus infection and is taking antibiotics. She blew her nose and yellow/green mucus comes out. She continues to have a yeast infection. She feels drained. She has exertional shortness of breath. Her  temperature was 99 on Sunday. She has no fever today.  Eating small meals. She is drinking protein shakes. She would like to lose a little weight. She has nausea and her diarrhea and vomiting are occasional. She has had some constipation x 2 days. Her bladder leakage and spasms are better. Her back pain and joint pain are unchanged. She has left mid axillary line tenderness. She had the pfizer covid vaccine in the left and right arm. Her diabetes is under good control. Her neuropathy is the same.    Past Medical History:  Diagnosis Date  . Abnormal stress test 02/14/2016   Overview:  Added automatically from request for surgery [id number]  . Anemia   . Anxiety   . Arthritis   . Bicuspid aortic valve   . CHF (congestive heart failure) ([place])   . CKD (chronic kidney disease) stage 3, GFR 30-59 ml/min   . Depression   . Diabetes mellitus ([place])   . Dizziness   . Fatty liver   . Frequent falls   . GERD (gastroesophageal reflux disease)   . Gout   . Heart murmur   . History of blood transfusion   . History of bone marrow transplant ([place])   . History of uterine fibroid   . Hx of cardiac catheterization 06/05/2016   Overview:  Normal coronaries 2017  . Hypertension   . Hypomagnesemia   . Multiple myeloma ([place])   . Personal history of chemotherapy   . Renal cyst     Past Surgical History:  Procedure Laterality Date  .  ABDOMINAL HYSTERECTOMY    . 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 CARDIAC ELECTROPHYSIOLOGY MAPPING AND ABLATION    . CARPAL TUNNEL RELEASE Bilateral   . CHOLECYSTECTOMY  2008  . COLONOSCOPY WITH PROPOFOL N/A 05/07/2017   Procedure: COLONOSCOPY WITH PROPOFOL;  Surgeon: [name], MD;  Location: [facility] ENDOSCOPY;  Service: Gastroenterology;  Laterality: N/A;  . ESOPHAGOGASTRODUODENOSCOPY (EGD) WITH PROPOFOL N/A 05/07/2017   Procedure: ESOPHAGOGASTRODUODENOSCOPY (EGD) WITH PROPOFOL;  Surgeon: [name], MD;  Location: [facility] ENDOSCOPY;  Service: Gastroenterology;  Laterality:  N/A;  . FOOT SURGERY Bilateral   . INCONTINENCE SURGERY  2009  . INTERSTIM IMPLANT PLACEMENT    . other     over active bladder  . OTHER SURGICAL HISTORY     bladder stimulator   . PARTIAL HYSTERECTOMY  03/1996   fibroids  . PORTA CATH INSERTION N/A 03/10/2019   Procedure: PORTA CATH INSERTION;  Surgeon: Algernon Huxley, MD;  Location: [facility];  Service: Cardiovascular;  Laterality: N/A;  . TONSILLECTOMY  2007    Family History  Problem Relation Age of Onset  . Colon cancer Father   . Renal Disease Father   . Diabetes Mellitus II Father   . Melanoma Paternal Grandmother   . Breast cancer Maternal Aunt 81  . Anemia Mother   . Heart disease Mother   . Heart failure Mother   . Renal Disease Mother   . Congestive Heart Failure Mother   . Heart disease Maternal Uncle   . Throat cancer Maternal Uncle   . Lung cancer Maternal Uncle   . Liver disease Maternal Uncle   . Heart failure Maternal Uncle   . Hearing loss Son 23       Suicide     Social History:  reports that she quit smoking about 28 years ago. Her smoking use included cigarettes. She has a 20.00 pack-year smoking history. She has never used smokeless tobacco. She reports previous alcohol use. She reports that she does not use drugs. Patient has not had ETOH in several months. She is on disability. She notes exposure to perchloroethylene [facility]).She lives much of her adult life in [place]. She was married with 2 sons. Her husband passed away. Her 2 sons took their own lives (age 4 and 57). She worked in [employer] in [employer]. She went back to school and earned an [facility] [employer] and works for [employer] for several years.She [place] sistersin [place]. The patient is alone today.  Allergies:  Allergies  Allergen Reactions  . Oxycodone-Acetaminophen Anaphylaxis    Swelling and rash  . Celebrex [Celecoxib] Diarrhea  . Codeine   . Benadryl [Diphenhydramine] Palpitations  . Morphine  Itching and Rash  . Ondansetron Diarrhea  . Tylenol [Acetaminophen] Itching and Rash    Current Medications: Current Outpatient Medications  Medication Sig Dispense Refill  . acetaminophen (TYLENOL) 500 MG tablet Take 500 mg by mouth every 6 (six) hours as needed.    Marland Kitchen acyclovir (ZOVIRAX) 400 MG tablet Take 1 tablet (400 mg total) by mouth 2 (two) times daily. 60 tablet 11  . allopurinol (ZYLOPRIM) 100 MG tablet TAKE 1 TABLET(100 MG) BY MOUTH DAILY as needed 90 tablet 1  . aspirin 81 MG chewable tablet Chew 1 tablet (81 mg total) by mouth daily. 30 tablet 0  . dexamethasone (DECADRON) 4 MG tablet Take 5 tablets (40m) on days 2, 9, 16, 23 of cycle 1 and 2 Take 5 tablets (221m on days 2, 16  of cycle 3,4,5 and 6 Take 5 tablets (86m) on days 2 of cycles 7 and beyond 120 tablet 0  . diclofenac sodium (VOLTAREN) 1 % GEL Apply 2 g topically 4 (four) times daily. 100 g 1  . diphenhydrAMINE (BENADRYL) 50 MG capsule Take 50 mg by mouth as needed.    . diphenoxylate-atropine (LOMOTIL) 2.5-0.025 MG tablet Take 2 tablets by mouth daily as needed for diarrhea or loose stools. 45 tablet 2  . fentaNYL (DURAGESIC - DOSED MCG/HR) 25 MCG/HR patch Place 25 mcg onto the skin every 3 (three) days.     . fexofenadine (ALLEGRA) 180 MG tablet TAKE 1 TABLET(180 MG) BY MOUTH DAILY 30 tablet 5  . fluconazole (DIFLUCAN) 100 MG tablet Take 1 tablet (100 mg total) by mouth daily for 3 days. 3 tablet 0  . fluconazole (DIFLUCAN) 150 MG tablet Take 1 tablet (150 mg total) by mouth daily. 1 tablet 0  . FLUoxetine (PROZAC) 40 MG capsule TAKE 1 CAPSULE EVERY DAY 90 capsule 1  . fluticasone (FLONASE) 50 MCG/ACT nasal spray Place 2 sprays into both nostrils daily. 16 g 2  . furosemide (LASIX) 20 MG tablet Take 20 mg by mouth as needed.     .Marland Kitchenglucose blood (ONE TOUCH ULTRA TEST) test strip     . hydrOXYzine (ATARAX/VISTARIL) 10 MG tablet TAKE 1 TABLET(10 MG) BY MOUTH THREE TIMES DAILY AS NEEDED 90 tablet 0  . lactulose  (CHRONULAC) 10 GM/15ML solution     . levofloxacin (LEVAQUIN) 500 MG tablet Take 1 tablet (500 mg total) by mouth daily for 7 days. 7 tablet 0  . lidocaine-prilocaine (EMLA) cream APPLY TOPICALLY AS NEEDED. 30 g 0  . metFORMIN (GLUCOPHAGE-XR) 500 MG 24 hr tablet Take 1 tablet (500 mg total) by mouth daily with breakfast. 90 tablet 1  . Misc Natural Products (OSTEO BI-FLEX ADV TRIPLE ST PO) Take 2 tablets by mouth daily.    . montelukast (SINGULAIR) 10 MG tablet Take 1 tablet by mouth on the day before treatment and for 2 days after treatment. 30 tablet 0  . Multiple Vitamins-Minerals (HAIR/SKIN/NAILS/BIOTIN PO) Take 1 capsule by mouth 3 (three) times daily.    .Marland KitchenNARCAN 4 MG/0.1ML LIQD nasal spray kit 1 spray.    . Omega 3-6-9 Fatty Acids (OMEGA 3-6-9 COMPLEX) CAPS Take by mouth.    .Marland Kitchenomeprazole (PRILOSEC) 40 MG capsule TAKE 1 CAPSULE EVERY DAY 90 capsule 3  . pomalidomide (POMALYST) 3 MG capsule Take 1 capsule (3 mg total) by mouth daily. [employer] Auth # [id number]    Date Obtained 11/18/2019 21 capsule 0  . promethazine (PHENERGAN) 25 MG tablet Take 1 tablet (25 mg total) by mouth every 6 (six) hours as needed for nausea or vomiting. 90 tablet 0  . promethazine-dextromethorphan (PROMETHAZINE-DM) 6.25-15 MG/5ML syrup Take 5 mLs by mouth 4 (four) times daily as needed for cough. 240 mL 0  . tiZANidine (ZANAFLEX) 4 MG tablet Take 1 tablet (4 mg total) by mouth every 8 (eight) hours as needed for muscle spasms. 270 tablet 1  . traZODone (DESYREL) 100 MG tablet TAKE 1 TABLET AT BEDTIME  FOR  SLEEP 90 tablet 1   No current facility-administered medications for this visit.   Facility-Administered Medications Ordered in Other Visits  Medication Dose Route Frequency Provider Last Rate Last Admin  . heparin lock flush 100 unit/mL  500 Units Intravenous Once Jamyiah Labella C, MD      . sodium chloride flush (NS) 0.9 % injection 10  mL  10 mL Intravenous PRN Lequita Asal, MD   10 mL at 12/02/19 0908      Review of Systems  Constitutional: Positive for malaise/fatigue (drained) and weight loss (2 lbs). Negative for chills, diaphoresis and fever.       Feeling ok.  HENT: Positive for sinus pain (sinus infection; on antibiotics). Negative for congestion, ear discharge, ear pain, hearing loss, nosebleeds, sore throat and tinnitus.        Yellow/green mucus coming out of nose.  Eyes: Negative for blurred vision.  Respiratory: Positive for shortness of breath (exertional). Negative for cough, hemoptysis and sputum production.   Cardiovascular: Negative for chest pain, palpitations and leg swelling.  Gastrointestinal: Positive for constipation (x 2 days) and nausea (chronic s/p gallbladder surgery, controlled with medication). Diarrhea: occasional. Vomiting: occasional.       Eating small meals. Drinking protein shakes.  Genitourinary: Negative for dysuria, frequency, hematuria and urgency.       Yeast infection. Bladder leakage   Musculoskeletal: Positive for back pain (chroni; paraspinal tenderness; upper back), joint pain and myalgias (left mid axillary line). Negative for falls and neck pain.  Skin: Negative for itching and rash.       Using biotene mouth wash for dry mouth.  Neurological: Positive for sensory change (chronic numbness in fingers are better; toes are worse with intermittent pain). Negative for dizziness, tingling, weakness and headaches.  Endo/Heme/Allergies: Positive for environmental allergies.       Diabetes, under good control.  Psychiatric/Behavioral: Negative for depression and memory loss. The patient is not nervous/anxious and does not have insomnia.   All other systems reviewed and are negative.   Performance status (ECOG): 2  Vitals Blood pressure 114/73, pulse 91, temperature (!) 96.4 F (35.8 C), temperature source Tympanic, weight 143 lb 6.6 oz (65.1 kg), SpO2 97 %.   Physical Exam Vitals and nursing note reviewed.  Constitutional:      General: She is  not in acute distress.    Appearance: She is well-developed and well-nourished. She is not diaphoretic.  HENT:     Head: Normocephalic and atraumatic.     Mouth/Throat:     Pharynx: No oropharyngeal exudate.      Comments: Curly dark blonde hair. Dentures. Mouth dry. Mask. Eyes:     General: No scleral icterus.    Extraocular Movements: EOM normal.     Conjunctiva/sclera: Conjunctivae normal.     Pupils: Pupils are equal, round, and reactive to light.     Comments: Glasses. Brown eyes.   Cardiovascular:     Rate and Rhythm: Normal rate and regular rhythm.     Heart sounds: Normal heart sounds. No murmur heard.   Pulmonary:     Effort: Pulmonary effort is normal. No respiratory distress.     Breath sounds: Normal breath sounds. No wheezing or rales.  Chest:     Chest wall: No tenderness.  Abdominal:     General: Bowel sounds are normal. There is no distension.     Palpations: Abdomen is soft. There is no mass.     Tenderness: There is no abdominal tenderness. There is no guarding or rebound.  Musculoskeletal:        General: Tenderness (BLE) present. No edema. Normal range of motion.     Cervical back: Normal range of motion and neck supple.  Lymphadenopathy:     Cervical: No cervical adenopathy.     Upper Body:  No axillary adenopathy present.    Right  upper body: No supraclavicular adenopathy.     Left upper body: No supraclavicular adenopathy.  Skin:    General: Skin is warm and dry.  Neurological:     Mental Status: She is alert and oriented to person, place, and time.  Psychiatric:        Mood and Affect: Mood and affect normal.        Behavior: Behavior normal.        Thought Content: Thought content normal.        Judgment: Judgment normal.     Infusion on 12/02/2019  Component Date Value Ref Range Status  . Sodium 12/02/2019 138  135 - 145 mmol/L Final  . Potassium 12/02/2019 3.9  3.5 - 5.1 mmol/L Final  . Chloride 12/02/2019 105  98 - 111 mmol/L Final  .  CO2 12/02/2019 23  22 - 32 mmol/L Final  . Glucose, Bld 12/02/2019 120* 70 - 99 mg/dL Final   Glucose reference range applies only to samples taken after fasting for at least 8 hours.  . BUN 12/02/2019 22  8 - 23 mg/dL Final  . Creatinine, Ser 12/02/2019 1.13* 0.44 - 1.00 mg/dL Final  . Calcium 12/02/2019 9.5  8.9 - 10.3 mg/dL Final  . Total Protein 12/02/2019 6.6  6.5 - 8.1 g/dL Final  . Albumin 12/02/2019 4.1  3.5 - 5.0 g/dL Final  . AST 12/02/2019 24  15 - 41 U/L Final  . ALT 12/02/2019 23  0 - 44 U/L Final  . Alkaline Phosphatase 12/02/2019 74  38 - 126 U/L Final  . Total Bilirubin 12/02/2019 0.5  0.3 - 1.2 mg/dL Final  . GFR calc non Af Amer 12/02/2019 52* >60 mL/min Final  . GFR calc Af Amer 12/02/2019 60* >60 mL/min Final  . Anion gap 12/02/2019 10  5 - 15 Final   Performed at [facility], [address], [place] [address]  . WBC 12/02/2019 3.6* 4.0 - 10.5 K/uL Final  . RBC 12/02/2019 3.02* 3.87 - 5.11 MIL/uL Final  . Hemoglobin 12/02/2019 9.9* 12.0 - 15.0 g/dL Final  . HCT 12/02/2019 29.0* 36.0 - 46.0 % Final  . MCV 12/02/2019 96.0  80.0 - 100.0 fL Final  . MCH 12/02/2019 32.8  26.0 - 34.0 pg Final  . MCHC 12/02/2019 34.1  30.0 - 36.0 g/dL Final  . RDW 12/02/2019 16.5* 11.5 - 15.5 % Final  . Platelets 12/02/2019 150  150 - 400 K/uL Final  . nRBC 12/02/2019 0.0  0.0 - 0.2 % Final  . Neutrophils Relative % 12/02/2019 68  % Final  . Neutro Abs 12/02/2019 2.4  1.7 - 7.7 K/uL Final  . Lymphocytes Relative 12/02/2019 20  % Final  . Lymphs Abs 12/02/2019 0.7  0.7 - 4.0 K/uL Final  . Monocytes Relative 12/02/2019 10  % Final  . Monocytes Absolute 12/02/2019 0.3  0.1 - 1.0 K/uL Final  . Eosinophils Relative 12/02/2019 1  % Final  . Eosinophils Absolute 12/02/2019 0.1  0.0 - 0.5 K/uL Final  . Basophils Relative 12/02/2019 1  % Final  . Basophils Absolute 12/02/2019 0.1  0.0 - 0.1 K/uL Final  . Immature Granulocytes 12/02/2019 0  % Final  . Abs Immature  Granulocytes 12/02/2019 0.01  0.00 - 0.07 K/uL Final   Performed at [facility], [address] [place], [place] [address]  . Magnesium 12/02/2019 1.0* 1.7 - 2.4 mg/dL Final   Performed at [facility], [place]  [address], [place] [address]  Orders Only on 11/30/2019  Component Date Value Ref Range Status  . Creatinine, Urine 11/30/2019 224.4  Not Estab. mg/dL Final  . Microalbumin, Urine 11/30/2019 115.3  Not Estab. ug/mL Final  . Microalb/Creat Ratio 11/30/2019 51* 0 - 29 mg/g creat Final   Comment:                        Normal:                0 -  29                        Moderately increased: 30 - 300                        Severely increased:       >300   Office Visit on 11/30/2019  Component Date Value Ref Range Status  . Cholesterol, Total 11/30/2019 186  100 - 199 mg/dL Final  . Triglycerides 11/30/2019 114  0 - 149 mg/dL Final  . HDL 11/30/2019 71  >39 mg/dL Final  . VLDL Cholesterol Cal 11/30/2019 20  5 - 40 mg/dL Final  . LDL Chol Calc (NIH) 11/30/2019 95  0 - 99 mg/dL Final  . Chol/HDL Ratio 11/30/2019 2.6  0.0 - 4.4 ratio Final   Comment:                                   T. Chol/HDL Ratio                                             Men  Women                               1/2 Avg.Risk  3.4    3.3                                   Avg.Risk  5.0    4.4                                2X Avg.Risk  9.6    7.1                                3X Avg.Risk 23.4   11.0     Assessment:  Sarah [name] is a 64 y.o. 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 Bone marrow revealed 80% plasma cells. Lambda free light chains were 1340. She had nephrotic range proteinuria. She initially underwent induction with RVD. Revlimid maintenance was discontinued on 01/21/2017 secondary to intolerance.   Bone marrowaspirate andbiopsyon 01/18/2021revealed  anormocellularmarrow withbut increased lambda-restricted plasma cells (9% aspirate, 40% CD138 immunohistochemistry).Findingswereconsistent with recurrent plasma cell myeloma.Flow cytometry revealed no monoclonal B-cell or phenotypically aberrant T-cell population. Cytogeneticswere 26, XX (normal). 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  M-spikehas been followed: 0 on  04/02/2016 -06/23/2019; and0.2 on 09/02/2017.  Lambda light chainshave been followed: 22.2 (ratio 0.56) on 07/03/2017, 30.8 (ratio 0.78) on 09/02/2017, 36.9 (ratio 0.40) on 10/21/2017, 37.4 (ratio 0.41) on 12/16/2017, 70.7(ratio 0.31) on 02/17/2018, 64.2 (ratio 0.27) on 04/07/2018, 78.9 (ratio 0.18) on 05/26/2018, 128.8 (ratio 0.17) on 08/06/2018, 181.5 (ratio 0.13) on 10/08/2018, 130.9 (ratio 0.13) on 10/20/2018, 160.7 (ratio 0.10)on 12/09/2018, 236.6 (ratio 0.07) on 02/01/2019, 363.6 (ratio 0.04) on 03/22/2019, 404.8 (ratio 0.04) on 04/05/2019, 420.7 (ratio 0.03) on 05/24/2019, 573.4 (ratio 0.03) on 06/23/2019, 451.05(ratio 0.02) on02/19/2021, and47.2 (ratio 0.13) on 10/25/2019.  24 hour UPEPon 06/03/2019 revealed kappa free light chains95.76,lambda free light chains1,260.71, andratio 0.08.24 hourUPEPon 02/22/2021revealed totalproteinof736m/24 hrs withlambdafree light chains 1,084.162mL andratioof0.10(1.03-31.76). M spike inurinewas46.1%(36151m4 hrs).  Bone surveyon 04/08/2016 and11/28/2018 revealed no definite lytic lesion seen in the visualized skeleton.Bone surveyon 11/19/2018 revealed no suspicious lucent lesionsand no acute bony abnormality.PET scanon 07/12/2019 revealed no focal metabolic activity to suggest active myeloma within the skeleton. There wereno lytic lesions identified on the CT portion of the examorsoft tissue plasmacytomas. There was no evidence of multiple myeloma.  Pretreatment RBC phenotypeon 09/23/2019 waspositivefor C, e, DUFFY  B, KIDD B, M, S, and s antigen; negativefor c, E, KELL, DUFFY A, KIDD A, and N antigen.  Sheis day 39 ofcycle #2 daratumumab and hyaluronidase-fihj, Pomalyst, and Decadron (DPd)(09/27/2019 - 10/25/2019).  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 Zometa was discontinued in 01/2017. She has chronic nauseaon Phenergan.  She has B12 deficiency. B12 was 254 on 04/09/2017, 295 on 08/20/2018, and 391 on 10/08/2018. Shewasonoral B12.She received B12 monthly (last04/29/2021).Folate was18.6on 02/08/2019.  She has iron deficiency. Ferritin was 32 on 07/01/2019. She received Venoferon 07/15/2019 and 07/22/2019.  [facility] for fever and neutropenia. Cultures were negative. CXR was negative. She received broad spectrum antibiotics and daily Granix. She received IVF for acute renal insufficiency due to diarrhea and dehydration. Creatine was 1.66 on admission and 1.12 on discharge.  She received her second COVID-19vaccineon 10/09/2019.  Symptomatically, she feels ok. She has allergies with an associated sinus infection and is taking antibiotics. he continues to have a yeast infection. She feels drained. She has exertional shortness of breath.  She has no fever today.  Plan: 1.   Labs today: CBC with diff, CMP, Mg. 2. Stage III multiple myeloma Clinically,she feels weak today. Lambda free light chains have improved from451.05on02/19/2021to 47.2after cycle #1. She is day1of cycle #3 daratumumab plus Pomalyst and dexamethasone (DPd). She began cycle #2 Pomlayst on 10/26/2019. She is tolerating treatment without progressive neuropathy.                         She notes intermittent discomfort in her toes.             Postpone treatment today.               Hold Pomalyst and Decadron.             Treatment  plan:                          Pomalidomide (Pomalyst) 3 mg po q day 1-21.                         Decadron 20 mg po day 1-2, day 8-9, 15-16, and 22-23 with cycle #1 and 2. Decadron 20 mg po on  days 1-2 and 15-16 x 4 cycles (if needed). Daratumumab and hyaluronidase-fihj (Darzalez Faspro) 1800 mg SQ on days 1, 8, 15, and 22. Premeds for [employer]:  Singulair 10 mg po day before, day of, and 2 days. Ondansetron 8 mg po,Benadryl 50 mg po, andTylenol 650 mg po.             Discuss symptom management. She has antiemetics and pain medications at home to use on a prn bases.  Interventions are adequate.    3. Normocytic anemia Hematocrit 29.0 Hemoglobin9.9. MCV96.0.Platelets150,000today. Ferritin128 on 10/04/2019. TSH was normal on02/20/2020  Creatinine 1.10(CrCl54.83m/minute). Etiology secondary to underlying myeloma and treatment. 4. Grade II peripheral neuropathy Etiologysecondary toprior chemotherapy. Neuropathy slightly worse in toes. Continue to monitor. 5.B12 deficiency Continue B12 monthly(next due 11/25/2019). Folate was18.6on 02/08/2019. Check folate annually. 6. Hypomagnesemia, chronic Magnesium1.0 today. Magnesium 4 gm IV today. Continue IV magnesium supplementation twice a week (Mondays and Thursdays).  7.Nausea Patient has chronic nausea relieved only with Phenergan. Patient receives Phenergan 25 mg IV weekly with treatment. 8.No treatment today. 9.   Magnesium 4 gm IV today. 10.   RTC tomorrow for labs (Mg) and IV magnesium. 11.   RTC on 06/07 for labs (Mg) and IV  magnesium. 12.   RTC on 06/10 for MD assessment, labs (CBC with diff, CMP, Mg), day 1 of cycle #3 daratumumab and IV magnesium.   I discussed the assessment and treatment plan with the patient.  The patient was provided an opportunity to ask questions and all were answered.  The patient agreed with the plan and demonstrated an understanding of the instructions.  The patient was advised to call back if the symptoms worsen or if the condition fails to improve as anticipated.   MLequita Asal MD, PhD    12/02/2019, 9:43 AM  I, ASelena Batten am acting as scribe for [employer] CMike Gip MD, PhD.  I, German Manke C. CMike Gip MD, have reviewed the above documentation for accuracy and completeness, and I agree with the above.

## 2019-11-30 NOTE — Progress Notes (Signed)
Date:  11/30/2019   Name:  Sarah [name]   DOB:  [birth year]   MRN:  [id number]   Chief Complaint: Annual Exam (no pap/ breast exam/ follow up hospital stay ) Sarah [name] is a 64 y.o. female who presents today for her Complete Annual Exam. She feels poorly. She reports exercising none. She reports she is sleeping poorly. Recently admitted to [facility] with sepsis and pneumonia. She does not feel that she is recovering.  Mammogram  05/2017 DEXA ? Colonoscopy  05/2017 Immunization History  Administered Date(s) Administered  . Hepatitis B, adult 07/09/2016, 09/10/2016, 07/08/2017  . HiB (PRP-T) 07/09/2016, 09/10/2016, 07/08/2017  . IPV 07/09/2016, 09/10/2016, 07/08/2017  . Influenza,inj,Quad PF,6+ Mos 03/23/2015, 06/18/2016, 04/08/2017, 06/30/2017, 03/31/2018, 04/12/2019  . MMR 07/08/2017  . PFIZER SARS-COV-2 Vaccination 09/20/2019, 10/09/2019  . Pneumococcal Conjugate-13 03/23/2015, 07/09/2016, 01/07/2017, 03/11/2017  . Pneumococcal Polysaccharide-23 07/08/2017  . Tdap 07/09/2016, 09/10/2016, 02/12/2018  . Zoster Recombinat (Shingrix) 11/19/2017, 02/12/2018    Hypertension This is a chronic problem. The problem has been gradually improving since onset. The problem is controlled. Associated symptoms include palpitations and shortness of breath. Pertinent negatives include no chest pain or headaches. Treatments tried: bisoprolol and lisinopril recently discontinued.  Diabetes She presents for her follow-up diabetic visit. She has type 2 diabetes mellitus. Her disease course has been stable. Hypoglycemia symptoms include nervousness/anxiousness. Pertinent negatives for hypoglycemia include no dizziness or headaches. Associated symptoms include fatigue. Pertinent negatives for diabetes include no chest pain. Diabetic complications include nephropathy. Current diabetic treatment includes oral agent (monotherapy). She is compliant with treatment most of the time. An ACE inhibitor/angiotensin  II receptor blocker is not being taken. Eye exam is current.  Pneumonia She complains of cough and shortness of breath. There is no wheezing. The current episode started 1 to 4 weeks ago. The cough is productive and productive of purulent sputum. Associated symptoms include a fever. Pertinent negatives include no appetite change, chest pain, headaches, sore throat or trouble swallowing. Relieved by: completed a course of levaquin earlier in may. She reports moderate improvement on treatment.   CXR 11/15/19: FINDINGS: The heart size and mediastinal contours are within normal limits. No pneumothorax or pleural effusion is noted. Right internal jugular Port-A-Cath is unchanged in position. Left lung is clear. Stable right infrahilar opacity is noted which may represent pneumonia. The visualized skeletal structures are unremarkable.  IMPRESSION: Stable right infrahilar opacity is noted which may represent pneumonia.  Lab Results  Component Value Date   CREATININE 1.16 (H) 11/22/2019   BUN 22 11/22/2019   NA 139 11/22/2019   K 3.5 11/22/2019   CL 106 11/22/2019   CO2 23 11/22/2019   Lab Results  Component Value Date   CHOL 131 11/25/2018   HDL 63 11/25/2018   LDLCALC 52 11/25/2018   TRIG 79 11/25/2018   CHOLHDL 2.1 11/25/2018   Lab Results  Component Value Date   TSH 2.105 08/20/2018   Lab Results  Component Value Date   HGBA1C 6.4 (H) 10/16/2019   Lab Results  Component Value Date   WBC 5.1 11/25/2019   HGB 9.7 (L) 11/25/2019   HCT 28.3 (L) 11/25/2019   MCV 94.6 11/25/2019   PLT 177 11/25/2019   Lab Results  Component Value Date   ALT 31 11/22/2019   AST 28 11/22/2019   ALKPHOS 83 11/22/2019   BILITOT 0.3 11/22/2019     Review of Systems  Constitutional: Positive for chills, fatigue and fever. Negative for appetite change, diaphoresis and  unexpected weight change.  HENT: Positive for congestion and sinus pressure. Negative for sore throat and trouble  swallowing.   Eyes: Negative for visual disturbance.  Respiratory: Positive for cough and shortness of breath. Negative for chest tightness and wheezing.   Cardiovascular: Positive for palpitations. Negative for chest pain and leg swelling.  Gastrointestinal: Negative for abdominal pain, blood in stool, constipation and diarrhea.  Genitourinary: Negative for difficulty urinating, frequency and urgency.       Vaginitis - itching without bleeding or discharge  Musculoskeletal: Negative for arthralgias (due to chemotherapy) and joint swelling.  Skin: Negative for color change and rash.  Neurological: Negative for dizziness and headaches.  Psychiatric/Behavioral: Positive for decreased concentration. Negative for dysphoric mood and sleep disturbance. The patient is nervous/anxious.     Patient Active Problem List   Diagnosis Date Noted  . Sepsis ([place]) 11/13/2019  . Pneumonia of right lung due to infectious organism 11/13/2019  . Constipation 10/24/2019  . AKI (acute kidney injury) ([place]) 10/15/2019  . Neutropenic fever ([place]) 10/15/2019  . Febrile neutropenia ([place]) 10/15/2019  . Encounter for antineoplastic immunotherapy 10/03/2019  . Hypocalcemia 08/28/2019  . Stage 3 chronic kidney disease 05/12/2019  . Chronic nausea 12/17/2018  . Chemotherapy-induced neuropathy ([place]) 12/17/2018  . Hyperlipidemia associated with type 2 diabetes mellitus ([place]) 11/25/2018  . Normocytic anemia 10/21/2018  . Hypomagnesemia 08/20/2018  . Goals of care, counseling/discussion 08/20/2018  . B12 deficiency 08/20/2018  . Thrombocytopenia ([place]) 02/09/2018  . Benign essential HTN 08/21/2017  . Cardiomyopathy, idiopathic ([place]) 08/21/2017  . Carotid artery stenosis, asymptomatic, bilateral 08/06/2017  . Thyroid nodule 08/06/2017  . Cardiac syncope 07/25/2017  . Iron deficiency anemia due to chronic blood loss 05/13/2017  . Spinal stenosis of lumbar region with neurogenic claudication 04/09/2017  .  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 LBBB (left bundle branch block) 10/10/2016  . Long term prescription opiate use 09/07/2016  . Chronic pain syndrome 07/08/2016  . Pain medication agreement signed 07/08/2016  . Spondylosis of lumbar region without myelopathy or radiculopathy 07/08/2016  . Chronic diastolic CHF (congestive heart failure), NYHA class 2 ([place]) 06/05/2016  . LVH (left ventricular hypertrophy) due to hypertensive disease, with heart failure ([place]) 06/05/2016  . Mild aortic valve stenosis 06/05/2016  . 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[place]) 06/05/2016  . Environmental and seasonal allergies 04/19/2016  . Insomnia 04/19/2016  . Bicuspid aortic valve 04/19/2016  . Asthma 04/19/2016  . Aortic regurgitation 04/19/2016  . Type II diabetes mellitus with complication ([place]) 46/96/2952  . Depression, major, single episode, in partial remission ([place]) 04/12/2016  . Irritable bowel syndrome (IBS) 04/12/2016  . Hepatic steatosis 04/12/2016  . Multiple myeloma not having achieved remission ([place]) 04/02/2016  . 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[place]) 07/06/2015  . Stem cell transplant candidate 05/16/2015  . Disequilibrium 01/14/2013    Allergies  Allergen Reactions  . Oxycodone-Acetaminophen Anaphylaxis    Swelling and rash  . Celebrex [Celecoxib] Diarrhea  . Codeine   . Benadryl [Diphenhydramine] Palpitations  . Morphine Itching and Rash  . Ondansetron Diarrhea  . Tylenol [Acetaminophen] Itching and Rash    Past Surgical History:  Procedure Laterality Date  . ABDOMINAL HYSTERECTOMY    . 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 CARDIAC ELECTROPHYSIOLOGY MAPPING AND ABLATION    . CARPAL TUNNEL RELEASE Bilateral   . CHOLECYSTECTOMY  2008  . COLONOSCOPY WITH PROPOFOL N/A 05/07/2017   Procedure: COLONOSCOPY WITH PROPOFOL;  Surgeon: [name], MD;  Location: [facility];  Service: Gastroenterology;  Laterality: N/A;  . ESOPHAGOGASTRODUODENOSCOPY  (EGD) WITH PROPOFOL N/A 05/07/2017   Procedure: ESOPHAGOGASTRODUODENOSCOPY (EGD) WITH PROPOFOL;  Surgeon: [name], MD;  [facility] END[facility]COPY;  Service: Gastroenterology;  Laterality: N/A;  . FOOT SURGERY Bilateral   . INCONTINENCE SURGERY  2009  . INTERSTIM IMPLANT PLACEMENT    . other     over active bladder  . OTHER SURGICAL HISTORY     bladder stimulator   . PARTIAL HYSTERECTOMY  03/1996   fibroids  . PORTA CATH INSERTION N/A 03/10/2019   Procedure: PORTA CATH INSERTION;  Surgeon: Algernon Huxley, MD;  Location: [facility];  Service: Cardiovascular;  Laterality: N/A;  . TONSILLECTOMY  2007    Social History   Tobacco Use  . Smoking status: Former Smoker    Packs/day: 1.00    Years: 20.00    Pack years: 20.00    Types: Cigarettes    Quit date: 07/02/1991    Years since quitting: 28.4  . Smokeless tobacco: Never Used  Substance Use Topics  . Alcohol use: Not Currently  . Drug use: No     Medication list has been reviewed and updated.  Current Meds  Medication Sig  . acetaminophen (TYLENOL) 500 MG tablet Take 500 mg by mouth every 6 (six) hours as needed.  Marland Kitchen acyclovir (ZOVIRAX) 400 MG tablet Take 1 tablet (400 mg total) by mouth 2 (two) times daily.  Marland Kitchen allopurinol (ZYLOPRIM) 100 MG tablet TAKE 1 TABLET(100 MG) BY MOUTH DAILY as needed  . aspirin 81 MG chewable tablet Chew 1 tablet (81 mg total) by mouth daily.  Marland Kitchen dexamethasone (DECADRON) 4 MG tablet Take 5 tablets (81m) on days 2, 9, 16, 23 of cycle 1 and 2 Take 5 tablets (221m on days 2, 16 of cycle 3,4,5 and 6 Take 5 tablets (2033mon days 2 of cycles 7 and beyond  . diclofenac sodium (VOLTAREN) 1 % GEL Apply 2 g topically 4 (four) times daily.  . diphenhydrAMINE (BENADRYL) 50 MG capsule Take 50 mg by mouth as needed.  . diphenoxylate-atropine (LOMOTIL) 2.5-0.025 MG tablet Take 2 tablets by mouth daily as needed for diarrhea or loose stools.  . fentaNYL (DURAGESIC - DOSED MCG/HR) 25 MCG/HR patch Place 25  mcg onto the skin every 3 (three) days.   . fexofenadine (ALLEGRA) 180 MG tablet TAKE 1 TABLET(180 MG) BY MOUTH DAILY  . fluconazole (DIFLUCAN) 150 MG tablet Take 1 tablet (150 mg total) by mouth daily.  . FMarland KitchenUoxetine (PROZAC) 40 MG capsule TAKE 1 CAPSULE EVERY DAY  . fluticasone (FLONASE) 50 MCG/ACT nasal spray Place 2 sprays into both nostrils daily.  . furosemide (LASIX) 20 MG tablet Take 20 mg by mouth as needed.   . gMarland Kitchenucose blood (ONE TOUCH ULTRA TEST) test strip   . hydrOXYzine (ATARAX/VISTARIL) 10 MG tablet TAKE 1 TABLET(10 MG) BY MOUTH THREE TIMES DAILY AS NEEDED  . lactulose (CHRONULAC) 10 GM/15ML solution   . lidocaine-prilocaine (EMLA) cream APPLY TOPICALLY AS NEEDED.  . metFORMIN (GLUCOPHAGE-XR) 500 MG 24 hr tablet Take 1 tablet (500 mg total) by mouth daily with breakfast.  . montelukast (SINGULAIR) 10 MG tablet Take 1 tablet by mouth on the day before treatment and for 2 days after treatment.  . Multiple Vitamins-Minerals (HAIR/SKIN/NAILS/BIOTIN PO) Take 1 capsule by mouth 3 (three) times daily.  . NMarland KitchenRCAN 4 MG/0.1ML LIQD nasal spray kit 1 spray.  . oMarland Kitcheneprazole (PRILOSEC) 40 MG capsule TAKE 1 CAPSULE EVERY DAY  . pomalidomide (POMALYST)  3 MG capsule Take 1 capsule (3 mg total) by mouth daily. [employer] Auth # [id number]     Date Obtained 11/18/2019  . promethazine (PHENERGAN) 25 MG tablet Take 1 tablet (25 mg total) by mouth every 6 (six) hours as needed for nausea or vomiting.  . promethazine-dextromethorphan (PROMETHAZINE-DM) 6.25-15 MG/5ML syrup Take 5 mLs by mouth 4 (four) times daily as needed for cough.  Marland Kitchen tiZANidine (ZANAFLEX) 4 MG tablet Take 1 tablet (4 mg total) by mouth every 8 (eight) hours as needed for muscle spasms.  . traZODone (DESYREL) 100 MG tablet TAKE 1 TABLET AT BEDTIME  FOR  SLEEP    PHQ 2/9 Scores 11/30/2019 10/27/2019 01/04/2019 11/25/2018  PHQ - 2 Score 0 _0 PHQ- 9 Score _1 GAD 7 : Generalized Anxiety Score 11/30/2019 10/27/2019 01/04/2019 11/25/2018    Nervous, Anxious, on Edge _2 Control/stop worrying 0 3 1 0  Worry too much - different things 0 3 2 0  Trouble relaxing 3 0 1 2  Restless 3 0 1 2  Easily annoyed or irritable 1 0 0 0  Afraid - awful might happen 0 3 0 0  Total GAD 7 Score _3 Anxiety Difficulty Not difficult at all Not difficult at all Somewhat difficult Very difficult      BP Readings from Last 3 Encounters:  11/30/19 112/72  11/25/19 131/69  11/25/19 118/80    Physical Exam Vitals and nursing note reviewed.  Constitutional:      General: She is not in acute distress.    Appearance: She is well-developed. She is ill-appearing.  HENT:     Head: Normocephalic and atraumatic.     Right Ear: Tympanic membrane and ear canal normal.     Left Ear: Tympanic membrane and ear canal normal.     Nose:     Right Sinus: No maxillary sinus tenderness.     Left Sinus: No maxillary sinus tenderness.  Eyes:     General: No scleral icterus.       Right eye: No discharge.        Left eye: No discharge.     Conjunctiva/sclera: Conjunctivae normal.  Neck:     Thyroid: No thyroid mass, thyromegaly or thyroid tenderness.     Vascular: No carotid bruit.  Cardiovascular:     Rate and Rhythm: Normal rate and regular rhythm.  No extrasystoles are present.    Pulses: Normal pulses.     Heart sounds: Heart sounds are distant. No murmur.  Pulmonary:     Effort: Pulmonary effort is normal. No respiratory distress.     Breath sounds: Normal breath sounds. No wheezing or rhonchi.  Chest:     Breasts:        Right: No mass, nipple discharge, skin change or tenderness.        Left: No mass, nipple discharge, skin change or tenderness.  Abdominal:     General: Bowel sounds are normal.     Palpations: Abdomen is soft.     Tenderness: There is no abdominal tenderness. There is no right CVA tenderness, left CVA tenderness, guarding or rebound.  Musculoskeletal:        General: Normal range of motion.     Cervical  back: Normal range of motion. No erythema.  Lymphadenopathy:     Cervical: No cervical adenopathy.  Skin:    General: Skin is warm and dry.  Findings: No lesion or rash.  Neurological:     Mental Status: She is alert and oriented to person, place, and time.     Cranial Nerves: No cranial nerve deficit.     Sensory: No sensory deficit.     Deep Tendon Reflexes: Reflexes are normal and symmetric.  Psychiatric:        Attention and Perception: Attention normal.        Mood and Affect: Mood normal.        Speech: Speech normal.     Wt Readings from Last 3 Encounters:  11/30/19 142 lb (64.4 kg)  11/18/19 145 lb 15.1 oz (66.2 kg)  11/14/19 152 lb 8.9 oz (69.2 kg)    BP 112/72   Pulse 88   Temp 98.9 F (37.2 C) (Oral)   Ht 5' 2" (1.575 m)   Wt 142 lb (64.4 kg)   SpO2 98%   BMI 25.97 kg/m   Assessment and Plan: 1. Annual physical exam Stable exam without acute findings Continue healthy diet, exercise as able Currently on hiatus from Chemotx but starting back next week  2. Type II diabetes mellitus with complication (HCC) Clinically stable by exam and report without s/s of hypoglycemia. DM complicated by lipids, htn. Tolerating medications - metformin -  well without side effects or other concerns. Statin therapy on hold due to chemotherapy - pt has changed her diet and would like to see lipids off of medicatioin - Lipid panel - Microalbumin / creatinine urine ratio  3. Pneumonia of right upper lobe due to infectious organism Persistent shortness of breath and purulent sputum so will give another 5 days of Levaquin and consider repeat CXR if not improving - levofloxacin (LEVAQUIN) 500 MG tablet; Take 1 tablet (500 mg total) by mouth daily for 7 days.  Dispense: 7 tablet; Refill: 0  4. Benign essential HTN Now off of both medications - lisinopril and amlodipine with normal Bp readings Continue to monitor and resume treatment if needed  5. Encounter for screening  mammogram for breast cancer Schedule at [facility]; Future  6. Multiple myeloma not having achieved remission ([place]) Begin followed closely by Oncology  7. Yeast vaginitis - fluconazole (DIFLUCAN) 100 MG tablet; Take 1 tablet (100 mg total) by mouth daily for 3 days.  Dispense: 3 tablet; Refill: 0  8. Bisphosphonate-associated osteonecrosis of the jaw [facility]) From Zometa therapy sustained a jaw fracture Currently not causing significant disability   Partially dictated using Editor, commissioning. Any errors are unintentional.  Halina Maidens, MD [facility]  11/30/2019

## 2019-12-01 ENCOUNTER — Encounter: Payer: Self-pay | Admitting: Hematology and Oncology

## 2019-12-01 LAB — MICROALBUMIN / CREATININE URINE RATIO
Creatinine, Urine: 224.4 mg/dL
Microalb/Creat Ratio: 51 mg/g creat — ABNORMAL HIGH (ref 0–29)
Microalbumin, Urine: 115.3 ug/mL

## 2019-12-01 LAB — LIPID PANEL
Chol/HDL Ratio: 2.6 ratio (ref 0.0–4.4)
Cholesterol, Total: 186 mg/dL (ref 100–199)
HDL: 71 mg/dL (ref >39)
LDL Chol Calc (NIH): 95 mg/dL (ref 0–99)
Triglycerides: 114 mg/dL (ref 0–149)
VLDL Cholesterol Cal: 20 mg/dL (ref 5–40)

## 2019-12-01 NOTE — Progress Notes (Signed)
Patient was called for pre screening states she currently has a sinus infection . She is currently taking antibiotics and has no fever. Wanted to discuss if she would be getting treatment tomorrow.

## 2019-12-02 ENCOUNTER — Other Ambulatory Visit: Payer: Self-pay

## 2019-12-02 ENCOUNTER — Inpatient Hospital Stay: Payer: Medicare PPO | Admitting: Hematology and Oncology

## 2019-12-02 ENCOUNTER — Inpatient Hospital Stay: Payer: Medicare PPO

## 2019-12-02 ENCOUNTER — Inpatient Hospital Stay: Payer: Medicare PPO | Attending: Hematology and Oncology

## 2019-12-02 ENCOUNTER — Encounter: Payer: Self-pay | Admitting: Hematology and Oncology

## 2019-12-02 VITALS — BP 114/73 | HR 91 | Temp 96.4°F | Wt 143.4 lb

## 2019-12-02 DIAGNOSIS — Z79899 Other long term (current) drug therapy: Secondary | ICD-10-CM | POA: Diagnosis not present

## 2019-12-02 DIAGNOSIS — D649 Anemia, unspecified: Secondary | ICD-10-CM | POA: Insufficient documentation

## 2019-12-02 DIAGNOSIS — Z803 Family history of malignant neoplasm of breast: Secondary | ICD-10-CM | POA: Insufficient documentation

## 2019-12-02 DIAGNOSIS — Z5112 Encounter for antineoplastic immunotherapy: Secondary | ICD-10-CM | POA: Diagnosis not present

## 2019-12-02 DIAGNOSIS — T451X5A Adverse effect of antineoplastic and immunosuppressive drugs, initial encounter: Secondary | ICD-10-CM

## 2019-12-02 DIAGNOSIS — E860 Dehydration: Secondary | ICD-10-CM | POA: Diagnosis not present

## 2019-12-02 DIAGNOSIS — R197 Diarrhea, unspecified: Secondary | ICD-10-CM | POA: Diagnosis not present

## 2019-12-02 DIAGNOSIS — Z841 Family history of disorders of kidney and ureter: Secondary | ICD-10-CM | POA: Diagnosis not present

## 2019-12-02 DIAGNOSIS — M109 Gout, unspecified: Secondary | ICD-10-CM | POA: Diagnosis not present

## 2019-12-02 DIAGNOSIS — I129 Hypertensive chronic kidney disease with stage 1 through stage 4 chronic kidney disease, or unspecified chronic kidney disease: Secondary | ICD-10-CM | POA: Diagnosis not present

## 2019-12-02 DIAGNOSIS — Z7952 Long term (current) use of systemic steroids: Secondary | ICD-10-CM | POA: Diagnosis not present

## 2019-12-02 DIAGNOSIS — E8889 Other specified metabolic disorders: Secondary | ICD-10-CM | POA: Insufficient documentation

## 2019-12-02 DIAGNOSIS — R110 Nausea: Secondary | ICD-10-CM | POA: Diagnosis not present

## 2019-12-02 DIAGNOSIS — Z9481 Bone marrow transplant status: Secondary | ICD-10-CM | POA: Insufficient documentation

## 2019-12-02 DIAGNOSIS — Z888 Allergy status to other drugs, medicaments and biological substances status: Secondary | ICD-10-CM | POA: Insufficient documentation

## 2019-12-02 DIAGNOSIS — E538 Deficiency of other specified B group vitamins: Secondary | ICD-10-CM | POA: Diagnosis not present

## 2019-12-02 DIAGNOSIS — Z9221 Personal history of antineoplastic chemotherapy: Secondary | ICD-10-CM | POA: Diagnosis not present

## 2019-12-02 DIAGNOSIS — Z87891 Personal history of nicotine dependence: Secondary | ICD-10-CM | POA: Insufficient documentation

## 2019-12-02 DIAGNOSIS — G620 Drug-induced polyneuropathy: Secondary | ICD-10-CM

## 2019-12-02 DIAGNOSIS — Z800 Family history of malignant neoplasm of digestive organs: Secondary | ICD-10-CM | POA: Diagnosis not present

## 2019-12-02 DIAGNOSIS — Z801 Family history of malignant neoplasm of trachea, bronchus and lung: Secondary | ICD-10-CM | POA: Insufficient documentation

## 2019-12-02 DIAGNOSIS — Z8249 Family history of ischemic heart disease and other diseases of the circulatory system: Secondary | ICD-10-CM | POA: Insufficient documentation

## 2019-12-02 DIAGNOSIS — N183 Chronic kidney disease, stage 3 unspecified: Secondary | ICD-10-CM | POA: Diagnosis not present

## 2019-12-02 DIAGNOSIS — E1122 Type 2 diabetes mellitus with diabetic chronic kidney disease: Secondary | ICD-10-CM | POA: Diagnosis not present

## 2019-12-02 DIAGNOSIS — Z832 Family history of diseases of the blood and blood-forming organs and certain disorders involving the immune mechanism: Secondary | ICD-10-CM | POA: Insufficient documentation

## 2019-12-02 DIAGNOSIS — C9000 Multiple myeloma not having achieved remission: Secondary | ICD-10-CM | POA: Insufficient documentation

## 2019-12-02 DIAGNOSIS — J189 Pneumonia, unspecified organism: Secondary | ICD-10-CM

## 2019-12-02 DIAGNOSIS — Z885 Allergy status to narcotic agent status: Secondary | ICD-10-CM | POA: Insufficient documentation

## 2019-12-02 DIAGNOSIS — R600 Localized edema: Secondary | ICD-10-CM | POA: Diagnosis not present

## 2019-12-02 DIAGNOSIS — D709 Neutropenia, unspecified: Secondary | ICD-10-CM | POA: Insufficient documentation

## 2019-12-02 DIAGNOSIS — Z833 Family history of diabetes mellitus: Secondary | ICD-10-CM | POA: Insufficient documentation

## 2019-12-02 DIAGNOSIS — Z8379 Family history of other diseases of the digestive system: Secondary | ICD-10-CM | POA: Insufficient documentation

## 2019-12-02 DIAGNOSIS — Z886 Allergy status to analgesic agent status: Secondary | ICD-10-CM | POA: Insufficient documentation

## 2019-12-02 LAB — COMPREHENSIVE METABOLIC PANEL
ALT: 23 U/L (ref 0–44)
AST: 24 U/L (ref 15–41)
Albumin: 4.1 g/dL (ref 3.5–5.0)
Alkaline Phosphatase: 74 U/L (ref 38–126)
Anion gap: 10 (ref 5–15)
BUN: 22 mg/dL (ref 8–23)
CO2: 23 mmol/L (ref 22–32)
Calcium: 9.5 mg/dL (ref 8.9–10.3)
Chloride: 105 mmol/L (ref 98–111)
Creatinine, Ser: 1.13 mg/dL — ABNORMAL HIGH (ref 0.44–1.00)
GFR calc Af Amer: 60 mL/min — ABNORMAL LOW (ref >60)
GFR calc non Af Amer: 52 mL/min — ABNORMAL LOW (ref >60)
Glucose, Bld: 120 mg/dL — ABNORMAL HIGH (ref 70–99)
Potassium: 3.9 mmol/L (ref 3.5–5.1)
Sodium: 138 mmol/L (ref 135–145)
Total Bilirubin: 0.5 mg/dL (ref 0.3–1.2)
Total Protein: 6.6 g/dL (ref 6.5–8.1)

## 2019-12-02 LAB — CBC WITH DIFFERENTIAL/PLATELET
Abs Immature Granulocytes: 0.01 10*3/uL (ref 0.00–0.07)
Basophils Absolute: 0.1 10*3/uL (ref 0.0–0.1)
Basophils Relative: 1 %
Eosinophils Absolute: 0.1 10*3/uL (ref 0.0–0.5)
Eosinophils Relative: 1 %
HCT: 29 % — ABNORMAL LOW (ref 36.0–46.0)
Hemoglobin: 9.9 g/dL — ABNORMAL LOW (ref 12.0–15.0)
Immature Granulocytes: 0 %
Lymphocytes Relative: 20 %
Lymphs Abs: 0.7 10*3/uL (ref 0.7–4.0)
MCH: 32.8 pg (ref 26.0–34.0)
MCHC: 34.1 g/dL (ref 30.0–36.0)
MCV: 96 fL (ref 80.0–100.0)
Monocytes Absolute: 0.3 10*3/uL (ref 0.1–1.0)
Monocytes Relative: 10 %
Neutro Abs: 2.4 10*3/uL (ref 1.7–7.7)
Neutrophils Relative %: 68 %
Platelets: 150 10*3/uL (ref 150–400)
RBC: 3.02 MIL/uL — ABNORMAL LOW (ref 3.87–5.11)
RDW: 16.5 % — ABNORMAL HIGH (ref 11.5–15.5)
WBC: 3.6 10*3/uL — ABNORMAL LOW (ref 4.0–10.5)
nRBC: 0 % (ref 0.0–0.2)

## 2019-12-02 LAB — MAGNESIUM: Magnesium: 1 mg/dL — ABNORMAL LOW (ref 1.7–2.4)

## 2019-12-02 MED ORDER — SODIUM CHLORIDE 0.9 % IV SOLN
Freq: Once | INTRAVENOUS | Status: AC
  Filled 2019-12-02: qty 250

## 2019-12-02 MED ORDER — SODIUM CHLORIDE 0.9 % IV SOLN
6.0000 g | Freq: Once | INTRAVENOUS | Status: AC
  Administered 2019-12-02: 6 g via INTRAVENOUS
  Filled 2019-12-02: qty 12

## 2019-12-02 MED ORDER — SODIUM CHLORIDE 0.9% FLUSH
10.0000 mL | INTRAVENOUS | Status: DC | PRN
  Administered 2019-12-02: 10 mL via INTRAVENOUS
  Filled 2019-12-02: qty 10

## 2019-12-02 MED ORDER — HEPARIN SOD (PORK) LOCK FLUSH 100 UNIT/ML IV SOLN
500.0000 [IU] | Freq: Once | INTRAVENOUS | Status: AC
  Administered 2019-12-02: 500 [IU] via INTRAVENOUS
  Filled 2019-12-02: qty 5

## 2019-12-02 NOTE — Progress Notes (Signed)
Per Dr. Mike Gip no Darzalex today, pt to receive 6g IV Magnesium.

## 2019-12-03 ENCOUNTER — Inpatient Hospital Stay: Payer: Medicare PPO

## 2019-12-03 DIAGNOSIS — Z5112 Encounter for antineoplastic immunotherapy: Secondary | ICD-10-CM | POA: Diagnosis not present

## 2019-12-03 DIAGNOSIS — E041 Nontoxic single thyroid nodule: Secondary | ICD-10-CM | POA: Diagnosis not present

## 2019-12-03 DIAGNOSIS — E538 Deficiency of other specified B group vitamins: Secondary | ICD-10-CM | POA: Diagnosis not present

## 2019-12-03 DIAGNOSIS — D649 Anemia, unspecified: Secondary | ICD-10-CM | POA: Diagnosis not present

## 2019-12-03 DIAGNOSIS — T451X5A Adverse effect of antineoplastic and immunosuppressive drugs, initial encounter: Secondary | ICD-10-CM | POA: Diagnosis not present

## 2019-12-03 DIAGNOSIS — N183 Chronic kidney disease, stage 3 unspecified: Secondary | ICD-10-CM | POA: Diagnosis not present

## 2019-12-03 DIAGNOSIS — Z95828 Presence of other vascular implants and grafts: Secondary | ICD-10-CM

## 2019-12-03 DIAGNOSIS — C9000 Multiple myeloma not having achieved remission: Secondary | ICD-10-CM

## 2019-12-03 DIAGNOSIS — I129 Hypertensive chronic kidney disease with stage 1 through stage 4 chronic kidney disease, or unspecified chronic kidney disease: Secondary | ICD-10-CM | POA: Diagnosis not present

## 2019-12-03 DIAGNOSIS — G620 Drug-induced polyneuropathy: Secondary | ICD-10-CM | POA: Diagnosis not present

## 2019-12-03 LAB — CBC WITH DIFFERENTIAL/PLATELET
Abs Immature Granulocytes: 0.01 10*3/uL (ref 0.00–0.07)
Basophils Absolute: 0.1 10*3/uL (ref 0.0–0.1)
Basophils Relative: 1 %
Eosinophils Absolute: 0.2 10*3/uL (ref 0.0–0.5)
Eosinophils Relative: 4 %
HCT: 29.4 % — ABNORMAL LOW (ref 36.0–46.0)
Hemoglobin: 9.9 g/dL — ABNORMAL LOW (ref 12.0–15.0)
Immature Granulocytes: 0 %
Lymphocytes Relative: 15 %
Lymphs Abs: 0.7 10*3/uL (ref 0.7–4.0)
MCH: 32.4 pg (ref 26.0–34.0)
MCHC: 33.7 g/dL (ref 30.0–36.0)
MCV: 96.1 fL (ref 80.0–100.0)
Monocytes Absolute: 0.3 10*3/uL (ref 0.1–1.0)
Monocytes Relative: 7 %
Neutro Abs: 3.1 10*3/uL (ref 1.7–7.7)
Neutrophils Relative %: 73 %
Platelets: 149 10*3/uL — ABNORMAL LOW (ref 150–400)
RBC: 3.06 MIL/uL — ABNORMAL LOW (ref 3.87–5.11)
RDW: 16.3 % — ABNORMAL HIGH (ref 11.5–15.5)
WBC: 4.3 10*3/uL (ref 4.0–10.5)
nRBC: 0 % (ref 0.0–0.2)

## 2019-12-03 LAB — COMPREHENSIVE METABOLIC PANEL
ALT: 23 U/L (ref 0–44)
AST: 24 U/L (ref 15–41)
Albumin: 4.2 g/dL (ref 3.5–5.0)
Alkaline Phosphatase: 71 U/L (ref 38–126)
Anion gap: 10 (ref 5–15)
BUN: 19 mg/dL (ref 8–23)
CO2: 22 mmol/L (ref 22–32)
Calcium: 8.8 mg/dL — ABNORMAL LOW (ref 8.9–10.3)
Chloride: 104 mmol/L (ref 98–111)
Creatinine, Ser: 0.99 mg/dL (ref 0.44–1.00)
GFR calc Af Amer: >60 mL/min (ref >60)
GFR calc non Af Amer: >60 mL/min (ref >60)
Glucose, Bld: 116 mg/dL — ABNORMAL HIGH (ref 70–99)
Potassium: 3.6 mmol/L (ref 3.5–5.1)
Sodium: 136 mmol/L (ref 135–145)
Total Bilirubin: 0.4 mg/dL (ref 0.3–1.2)
Total Protein: 6.7 g/dL (ref 6.5–8.1)

## 2019-12-03 LAB — MAGNESIUM: Magnesium: 1.9 mg/dL (ref 1.7–2.4)

## 2019-12-03 MED ORDER — SODIUM CHLORIDE 0.9% FLUSH
10.0000 mL | Freq: Once | INTRAVENOUS | Status: AC
  Administered 2019-12-03: 10 mL via INTRAVENOUS
  Filled 2019-12-03: qty 10

## 2019-12-03 MED ORDER — HEPARIN SOD (PORK) LOCK FLUSH 100 UNIT/ML IV SOLN
500.0000 [IU] | Freq: Once | INTRAVENOUS | Status: AC
  Administered 2019-12-03: 500 [IU] via INTRAVENOUS
  Filled 2019-12-03: qty 5

## 2019-12-03 MED ORDER — HEPARIN SOD (PORK) LOCK FLUSH 100 UNIT/ML IV SOLN
INTRAVENOUS | Status: AC
  Filled 2019-12-03: qty 5

## 2019-12-03 NOTE — Progress Notes (Signed)
Per Dr Mike Gip, pt will not need mg infusion today.

## 2019-12-06 ENCOUNTER — Inpatient Hospital Stay: Payer: Medicare PPO

## 2019-12-06 ENCOUNTER — Other Ambulatory Visit: Payer: Self-pay | Admitting: Hematology and Oncology

## 2019-12-06 ENCOUNTER — Other Ambulatory Visit: Payer: Self-pay

## 2019-12-06 DIAGNOSIS — D649 Anemia, unspecified: Secondary | ICD-10-CM | POA: Diagnosis not present

## 2019-12-06 DIAGNOSIS — C9000 Multiple myeloma not having achieved remission: Secondary | ICD-10-CM

## 2019-12-06 DIAGNOSIS — I129 Hypertensive chronic kidney disease with stage 1 through stage 4 chronic kidney disease, or unspecified chronic kidney disease: Secondary | ICD-10-CM | POA: Diagnosis not present

## 2019-12-06 DIAGNOSIS — G620 Drug-induced polyneuropathy: Secondary | ICD-10-CM | POA: Diagnosis not present

## 2019-12-06 DIAGNOSIS — N183 Chronic kidney disease, stage 3 unspecified: Secondary | ICD-10-CM | POA: Diagnosis not present

## 2019-12-06 DIAGNOSIS — Z5112 Encounter for antineoplastic immunotherapy: Secondary | ICD-10-CM | POA: Diagnosis not present

## 2019-12-06 DIAGNOSIS — E538 Deficiency of other specified B group vitamins: Secondary | ICD-10-CM | POA: Diagnosis not present

## 2019-12-06 DIAGNOSIS — T451X5A Adverse effect of antineoplastic and immunosuppressive drugs, initial encounter: Secondary | ICD-10-CM | POA: Diagnosis not present

## 2019-12-06 LAB — CBC WITH DIFFERENTIAL/PLATELET
Abs Immature Granulocytes: 0 10*3/uL (ref 0.00–0.07)
Basophils Absolute: 0 10*3/uL (ref 0.0–0.1)
Basophils Relative: 1 %
Eosinophils Absolute: 0.2 10*3/uL (ref 0.0–0.5)
Eosinophils Relative: 6 %
HCT: 28.6 % — ABNORMAL LOW (ref 36.0–46.0)
Hemoglobin: 9.8 g/dL — ABNORMAL LOW (ref 12.0–15.0)
Immature Granulocytes: 0 %
Lymphocytes Relative: 23 %
Lymphs Abs: 0.8 10*3/uL (ref 0.7–4.0)
MCH: 32.8 pg (ref 26.0–34.0)
MCHC: 34.3 g/dL (ref 30.0–36.0)
MCV: 95.7 fL (ref 80.0–100.0)
Monocytes Absolute: 0.3 10*3/uL (ref 0.1–1.0)
Monocytes Relative: 9 %
Neutro Abs: 2 10*3/uL (ref 1.7–7.7)
Neutrophils Relative %: 61 %
Platelets: 117 10*3/uL — ABNORMAL LOW (ref 150–400)
RBC: 2.99 MIL/uL — ABNORMAL LOW (ref 3.87–5.11)
RDW: 15.9 % — ABNORMAL HIGH (ref 11.5–15.5)
WBC: 3.3 10*3/uL — ABNORMAL LOW (ref 4.0–10.5)
nRBC: 0 % (ref 0.0–0.2)

## 2019-12-06 LAB — MAGNESIUM: Magnesium: 1.2 mg/dL — ABNORMAL LOW (ref 1.7–2.4)

## 2019-12-06 MED ORDER — HEPARIN SOD (PORK) LOCK FLUSH 100 UNIT/ML IV SOLN
500.0000 [IU] | Freq: Once | INTRAVENOUS | Status: AC
  Administered 2019-12-06: 500 [IU] via INTRAVENOUS
  Filled 2019-12-06: qty 5

## 2019-12-06 MED ORDER — MAGNESIUM SULFATE 4 GM/100ML IV SOLN
4.0000 g | Freq: Once | INTRAVENOUS | Status: AC
  Administered 2019-12-06: 4 g via INTRAVENOUS

## 2019-12-06 MED ORDER — SODIUM CHLORIDE 0.9 % IV SOLN
Freq: Once | INTRAVENOUS | Status: AC
  Filled 2019-12-06: qty 250

## 2019-12-06 MED ORDER — SODIUM CHLORIDE 0.9% FLUSH
10.0000 mL | INTRAVENOUS | Status: DC | PRN
  Administered 2019-12-06: 10 mL via INTRAVENOUS
  Filled 2019-12-06: qty 10

## 2019-12-07 ENCOUNTER — Ambulatory Visit
Admission: RE | Admit: 2019-12-07 | Discharge: 2019-12-07 | Disposition: A | Discharge status: Home / Self Care | Payer: Medicare PPO | Source: Ambulatory Visit | Attending: Internal Medicine | Admitting: Internal Medicine

## 2019-12-07 DIAGNOSIS — Z1231 Encounter for screening mammogram for malignant neoplasm of breast: Secondary | ICD-10-CM | POA: Diagnosis not present

## 2019-12-08 NOTE — Progress Notes (Signed)
[facility]  [address] [place], [place] [address] Phone: [phone]  Fax: [phone]   Clinic Day:  12/09/2019  Referring physician: Glean Hess, MD  Chief Complaint: Sarah [name] is a 64 y.o. 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  HPI: The patient was last seen in the medical oncology clinic on 12/02/2019. At that time, she felt "ok". She was completing a course of Levaquin.  She had a yeast infection.  Neuropathy was stable.  Hematocrit was 29.0, hemoglobin 9.9, platelets 150,000, WBC 3,600 (ANC 2,400).  Creatinine was 1.13. Magnesium was 1.0. She received 6g IV magnesium. As her counts were dropping, decision was made to postpone initiation of cycle #3.  She was received 4g IV magnesium on 12/06/2019.   Labs followed: 12/03/2019: Hematocrit 29.4, hemoglobin 9.9, platelets 149,000, WBC 4,300 ([place] 3100). Calcium was 8.8. Magnesium was 1.9. 12/06/2019: Hematocrit 28.6, hemoglobin 9.8, platelets 117,000, WBC 3,300 ([place]). Magnesium was 1.2.   Bilateral mammogram on 12/07/2019 revealed no evidence of malignancy.   During the interim, the patient doing well. She is feeling achy and sore due to her low magnesium. She would like to receive IV 6g magnesium today to hold her over the weekend. She did not take her steroids. She brought her Pomalyst today. She finished her antibiotics on Monday. Her yeast infection has resolved with fluconazole. For her dry mouth, she is using biotine. Her gout is under good control with allopurinol. Her nausea is getting better. She will be seen for a transplant consideration of stem cell transplant on 12/31/2019.    Past Medical History:  Diagnosis Date  . Abnormal stress test 02/14/2016   Overview:  Added automatically from request for surgery [id number]  . Anemia     . Anxiety   . Arthritis   . Bicuspid aortic valve   . CHF (congestive heart failure) ([place])   . CKD (chronic kidney disease) stage 3, GFR 30-59 ml/min   . Depression   . Diabetes mellitus ([place])   . Dizziness   . Fatty liver   . Frequent falls   . GERD (gastroesophageal reflux disease)   . Gout   . Heart murmur   . History of blood transfusion   . History of bone marrow transplant ([place])   . History of uterine fibroid   . Hx of cardiac catheterization 06/05/2016   Overview:  Normal coronaries 2017  . Hypertension   . Hypomagnesemia   . Multiple myeloma ([place])   . Personal history of chemotherapy   . Renal cyst     Past Surgical History:  Procedure Laterality Date  . ABDOMINAL HYSTERECTOMY    . 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 CARDIAC ELECTROPHYSIOLOGY MAPPING AND ABLATION    . CARPAL TUNNEL RELEASE Bilateral   . CHOLECYSTECTOMY  2008  . COLONOSCOPY WITH PROPOFOL N/A 05/07/2017   Procedure: COLONOSCOPY WITH PROPOFOL;  Surgeon: [name], MD;  Location: [facility] ENDOSCOPY;  Service: Gastroenterology;  Laterality: N/A;  . ESOPHAGOGASTRODUODENOSCOPY (EGD) WITH PROPOFOL N/A 05/07/2017   Procedure: ESOPHAGOGASTRODUODENOSCOPY (EGD) WITH PROPOFOL;  Surgeon: [name], MD;  Location: [facility] ENDOSCOPY;  Service: Gastroenterology;  Laterality: N/A;  . FOOT SURGERY Bilateral   . INCONTINENCE SURGERY  2009  . INTERSTIM IMPLANT PLACEMENT    . other     over active bladder  . OTHER SURGICAL HISTORY  bladder stimulator   . PARTIAL HYSTERECTOMY  03/1996   fibroids  . PORTA CATH INSERTION N/A 03/10/2019   Procedure: PORTA CATH INSERTION;  Surgeon: Algernon Huxley, MD;  Location: [facility] CV LAB;  Service: Cardiovascular;  Laterality: N/A;  . TONSILLECTOMY  2007    Family History  Problem Relation Age of Onset  . Colon cancer Father   . Renal Disease Father   . Diabetes Mellitus II Father   . Melanoma Paternal Grandmother   . Breast cancer Maternal Aunt 33  . Anemia Mother   .  Heart disease Mother   . Heart failure Mother   . Renal Disease Mother   . Congestive Heart Failure Mother   . Heart disease Maternal Uncle   . Throat cancer Maternal Uncle   . Lung cancer Maternal Uncle   . Liver disease Maternal Uncle   . Heart failure Maternal Uncle   . Hearing loss Son 20       Suicide     Social History:  reports that she quit smoking about 28 years ago. Her smoking use included cigarettes. She has a 20.00 pack-year smoking history. She has never used smokeless tobacco. She reports previous alcohol use. She reports that she does not use drugs. Patient has not had ETOH in several months. She is on disability. She notes exposure to perchloroethylene [facility]).She lives much of her adult life in [place]. She was married with 2 sons. Her husband passed away. Her 2 sons took their own lives (age 14 and 32). She worked in [employer] in [employer]. She went back to school and earned an [employer] and works for [employer] for several years.She lives[place] sistersin [place]. The patient is alone today.  Allergies:  Allergies  Allergen Reactions  . Oxycodone-Acetaminophen Anaphylaxis    Swelling and rash  . Celebrex [Celecoxib] Diarrhea  . Codeine   . Benadryl [Diphenhydramine] Palpitations  . Morphine Itching and Rash  . Ondansetron Diarrhea  . Tylenol [Acetaminophen] Itching and Rash    Current Medications: Current Outpatient Medications  Medication Sig Dispense Refill  . acetaminophen (TYLENOL) 500 MG tablet Take 500 mg by mouth every 6 (six) hours as needed.    Marland Kitchen acyclovir (ZOVIRAX) 400 MG tablet Take 1 tablet (400 mg total) by mouth 2 (two) times daily. 60 tablet 11  . allopurinol (ZYLOPRIM) 100 MG tablet TAKE 1 TABLET(100 MG) BY MOUTH DAILY as needed 90 tablet 1  . aspirin 81 MG chewable tablet Chew 1 tablet (81 mg total) by mouth daily. 30 tablet 0  . dexamethasone (DECADRON) 4 MG tablet Take 5 tablets (22m) on days 2, 9, 16, 23 of  cycle 1 and 2 Take 5 tablets (27m on days 2, 16 of cycle 3,4,5 and 6 Take 5 tablets (204mon days 2 of cycles 7 and beyond 120 tablet 0  . diclofenac sodium (VOLTAREN) 1 % GEL Apply 2 g topically 4 (four) times daily. 100 g 1  . diphenhydrAMINE (BENADRYL) 50 MG capsule Take 50 mg by mouth as needed.    . diphenoxylate-atropine (LOMOTIL) 2.5-0.025 MG tablet Take 2 tablets by mouth daily as needed for diarrhea or loose stools. 45 tablet 2  . fentaNYL (DURAGESIC - DOSED MCG/HR) 25 MCG/HR patch Place 25 mcg onto the skin every 3 (three) days.     . fexofenadine (ALLEGRA) 180 MG tablet TAKE 1 TABLET(180 MG) BY MOUTH DAILY 30 tablet 5  . fluconazole (DIFLUCAN) 150 MG tablet Take 1 tablet (150  mg total) by mouth daily. 1 tablet 0  . FLUoxetine (PROZAC) 40 MG capsule TAKE 1 CAPSULE EVERY DAY 90 capsule 1  . fluticasone (FLONASE) 50 MCG/ACT nasal spray Place 2 sprays into both nostrils daily. 16 g 2  . furosemide (LASIX) 20 MG tablet Take 20 mg by mouth as needed.     Marland Kitchen glucose blood (ONE TOUCH ULTRA TEST) test strip     . hydrOXYzine (ATARAX/VISTARIL) 10 MG tablet TAKE 1 TABLET(10 MG) BY MOUTH THREE TIMES DAILY AS NEEDED 90 tablet 0  . lactulose (CHRONULAC) 10 GM/15ML solution     . lidocaine-prilocaine (EMLA) cream APPLY TOPICALLY AS NEEDED. 30 g 0  . metFORMIN (GLUCOPHAGE-XR) 500 MG 24 hr tablet Take 1 tablet (500 mg total) by mouth daily with breakfast. 90 tablet 1  . Misc Natural Products (OSTEO BI-FLEX ADV TRIPLE ST PO) Take 2 tablets by mouth daily.    . montelukast (SINGULAIR) 10 MG tablet Take 1 tablet by mouth on the day before treatment and for 2 days after treatment. 30 tablet 0  . Multiple Vitamins-Minerals (HAIR/SKIN/NAILS/BIOTIN PO) Take 1 capsule by mouth 3 (three) times daily.    Marland Kitchen NARCAN 4 MG/0.1ML LIQD nasal spray kit 1 spray.    . Omega 3-6-9 Fatty Acids (OMEGA 3-6-9 COMPLEX) CAPS Take by mouth.    Marland Kitchen omeprazole (PRILOSEC) 40 MG capsule TAKE 1 CAPSULE EVERY DAY 90 capsule 3  .  pomalidomide (POMALYST) 3 MG capsule Take 1 capsule (3 mg total) by mouth daily. [employer] Auth # [id number]     Date Obtained 11/18/2019 21 capsule 0  . promethazine (PHENERGAN) 25 MG tablet Take 1 tablet (25 mg total) by mouth every 6 (six) hours as needed for nausea or vomiting. 90 tablet 0  . promethazine-dextromethorphan (PROMETHAZINE-DM) 6.25-15 MG/5ML syrup Take 5 mLs by mouth 4 (four) times daily as needed for cough. 240 mL 0  . tiZANidine (ZANAFLEX) 4 MG tablet Take 1 tablet (4 mg total) by mouth every 8 (eight) hours as needed for muscle spasms. 270 tablet 1  . traZODone (DESYREL) 100 MG tablet TAKE 1 TABLET AT BEDTIME  FOR  SLEEP 90 tablet 1   No current facility-administered medications for this visit.   Facility-Administered Medications Ordered in Other Visits  Medication Dose Route Frequency Provider Last Rate Last Admin  . heparin lock flush 100 unit/mL  500 Units Intravenous Once Lataja Newland C, MD      . sodium chloride flush (NS) 0.9 % injection 10 mL  10 mL Intravenous PRN Lequita Asal, MD   10 mL at 12/09/19 0905    Review of Systems  Constitutional: Positive for weight loss (1 lb). Negative for chills, diaphoresis, fever and malaise/fatigue (drained).       Doing well.  HENT: Negative for congestion, ear discharge, ear pain, hearing loss, nosebleeds, sinus pain (sinus infection; on antibiotics), sore throat and tinnitus.        Dry mouth, using biotine.  Eyes: Negative for blurred vision.  Respiratory: Negative for cough, hemoptysis, sputum production and shortness of breath (exertional).   Cardiovascular: Negative for chest pain, palpitations and leg swelling.  Gastrointestinal: Positive for nausea (chronic s/p gallbladder surgery, controlled with medication, better). Negative for abdominal pain, blood in stool, constipation (x 2 days), diarrhea (occasional), heartburn, melena and vomiting (occasional).       Eating small meals. Drinking protein shakes.    Genitourinary: Negative for dysuria, frequency, hematuria and urgency.       Yeast infection, cleared.  Bladder leakage   Musculoskeletal: Positive for myalgias (sore and achy). Negative for back pain (chroni; paraspinal tenderness; upper back), falls, joint pain and neck pain.  Skin: Negative for itching and rash.       Using biotene mouth wash for dry mouth.  Neurological: Negative for dizziness, tingling, sensory change (chronic numbness in fingers are better; toes are worse with intermittent pain), weakness and headaches.  Endo/Heme/Allergies: Negative for environmental allergies. Does not bruise/bleed easily.       Diabetes, under good control.  Psychiatric/Behavioral: Negative for depression and memory loss. The patient is not nervous/anxious and does not have insomnia.   All other systems reviewed and are negative.    Performance status (ECOG): 2  Vitals Blood pressure 125/78, pulse 90, temperature (!) 96.6 F (35.9 C), temperature source Tympanic, weight 142 lb 6.7 oz (64.6 kg), SpO2 98 %.   Physical Exam Vitals and nursing note reviewed.  Constitutional:      General: She is not in acute distress.    Appearance: She is well-developed and well-nourished. She is not diaphoretic.  HENT:     Head: Normocephalic and atraumatic.     Mouth/Throat:     Pharynx: No oropharyngeal exudate.      Comments: Curly dark blonde hair. Dentures. Mouth dry. Mask. Eyes:     General: No scleral icterus.    Extraocular Movements: EOM normal.     Conjunctiva/sclera: Conjunctivae normal.     Pupils: Pupils are equal, round, and reactive to light.     Comments: Glasses. Brown eyes.  Cardiovascular:     Rate and Rhythm: Normal rate and regular rhythm.     Heart sounds: Normal heart sounds. No murmur heard.   Pulmonary:     Effort: Pulmonary effort is normal. No respiratory distress.     Breath sounds: Normal breath sounds. No wheezing or rales.  Chest:     Chest wall: No tenderness.   Abdominal:     General: Bowel sounds are normal. There is no distension.     Palpations: Abdomen is soft. There is no mass.     Tenderness: There is abdominal tenderness. There is no guarding or rebound.  Musculoskeletal:        General: No tenderness or edema. Normal range of motion.     Cervical back: Normal range of motion and neck supple.  Lymphadenopathy:     Head:     Right side of head: No preauricular, posterior auricular or occipital adenopathy.     Left side of head: No preauricular, posterior auricular or occipital adenopathy.     Cervical: No cervical adenopathy.     Upper Body:  No axillary adenopathy present.    Right upper body: No supraclavicular adenopathy.     Left upper body: No supraclavicular adenopathy.     Lower Body: No right inguinal adenopathy. No left inguinal adenopathy.  Skin:    General: Skin is warm and dry.  Neurological:     Mental Status: She is alert and oriented to person, place, and time.  Psychiatric:        Mood and Affect: Mood and affect normal.        Behavior: Behavior normal.        Thought Content: Thought content normal.        Judgment: Judgment normal.    Infusion on 12/09/2019  Component Date Value Ref Range Status  . Sodium 12/09/2019 139  135 - 145 mmol/L Final  . Potassium 12/09/2019 4.1  3.5 - 5.1 mmol/L Final  . Chloride 12/09/2019 104  98 - 111 mmol/L Final  . CO2 12/09/2019 24  22 - 32 mmol/L Final  . Glucose, Bld 12/09/2019 137* 70 - 99 mg/dL Final   Glucose reference range applies only to samples taken after fasting for at least 8 hours.  . BUN 12/09/2019 18  8 - 23 mg/dL Final  . Creatinine, Ser 12/09/2019 1.09* 0.44 - 1.00 mg/dL Final  . Calcium 12/09/2019 9.4  8.9 - 10.3 mg/dL Final  . Total Protein 12/09/2019 7.1  6.5 - 8.1 g/dL Final  . Albumin 12/09/2019 4.4  3.5 - 5.0 g/dL Final  . AST 12/09/2019 27  15 - 41 U/L Final  . ALT 12/09/2019 27  0 - 44 U/L Final  . Alkaline Phosphatase 12/09/2019 80  38 - 126 U/L  Final  . Total Bilirubin 12/09/2019 0.4  0.3 - 1.2 mg/dL Final  . GFR calc non Af Amer 12/09/2019 54* >60 mL/min Final  . GFR calc Af Amer 12/09/2019 >60  >60 mL/min Final  . Anion gap 12/09/2019 11  5 - 15 Final   Performed at [facility], [address], [place], [place] [address]  . WBC 12/09/2019 4.3  4.0 - 10.5 K/uL Final  . RBC 12/09/2019 3.29* 3.87 - 5.11 MIL/uL Final  . Hemoglobin 12/09/2019 10.7* 12.0 - 15.0 g/dL Final  . HCT 12/09/2019 31.0* 36 - 46 % Final  . MCV 12/09/2019 94.2  80.0 - 100.0 fL Final  . MCH 12/09/2019 32.5  26.0 - 34.0 pg Final  . MCHC 12/09/2019 34.5  30.0 - 36.0 g/dL Final  . RDW 12/09/2019 15.1  11.5 - 15.5 % Final  . Platelets 12/09/2019 128* 150 - 400 K/uL Final  . nRBC 12/09/2019 0.0  0.0 - 0.2 % Final  . Neutrophils Relative % 12/09/2019 71  % Final  . Neutro Abs 12/09/2019 3.1  1.7 - 7.7 K/uL Final  . Lymphocytes Relative 12/09/2019 14  % Final  . Lymphs Abs 12/09/2019 0.6* 0.7 - 4.0 K/uL Final  . Monocytes Relative 12/09/2019 8  % Final  . Monocytes Absolute 12/09/2019 0.3  0 - 1 K/uL Final  . Eosinophils Relative 12/09/2019 5  % Final  . Eosinophils Absolute 12/09/2019 0.2  0 - 0 K/uL Final  . Basophils Relative 12/09/2019 1  % Final  . Basophils Absolute 12/09/2019 0.0  0 - 0 K/uL Final  . Immature Granulocytes 12/09/2019 1  % Final  . Abs Immature Granulocytes 12/09/2019 0.03  0.00 - 0.07 K/uL Final   Performed at [facility], [address] [place], [place] [address]  . Magnesium 12/09/2019 1.1* 1.7 - 2.4 mg/dL Final   Performed at [facility], [address], [place], [place] [address]    Assessment:  Sarah [name] is a 64 y.o. 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 Bone marrow revealed 80% plasma cells. Lambda free light chains were 1340. She had nephrotic range proteinuria. She initially  underwent induction with RVD. Revlimid maintenance was discontinued on 01/21/2017 secondary to intolerance.   Bone marrowaspirate andbiopsyon 01/18/2021revealed anormocellularmarrow withbut increased lambda-restricted plasma cells (9% aspirate, 40% CD138 immunohistochemistry).Findingswereconsistent with recurrent plasma cell myeloma.Flow cytometry revealed no monoclonal B-cell or phenotypically aberrant T-cell population. Cytogeneticswere 89, XX (normal). 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  M-spikehas been followed: 0 on 04/02/2016 -06/23/2019;  and0.2 on 09/02/2017.  Lambda light chainshave been followed: 22.2 (ratio 0.56) on 07/03/2017, 30.8 (ratio 0.78) on 09/02/2017, 36.9 (ratio 0.40) on 10/21/2017, 37.4 (ratio 0.41) on 12/16/2017, 70.7(ratio 0.31) on 02/17/2018, 64.2 (ratio 0.27) on 04/07/2018, 78.9 (ratio 0.18) on 05/26/2018, 128.8 (ratio 0.17) on 08/06/2018, 181.5 (ratio 0.13) on 10/08/2018, 130.9 (ratio 0.13) on 10/20/2018, 160.7 (ratio 0.10)on 12/09/2018, 236.6 (ratio 0.07) on 02/01/2019, 363.6 (ratio 0.04) on 03/22/2019, 404.8 (ratio 0.04) on 04/05/2019, 420.7 (ratio 0.03) on 05/24/2019, 573.4 (ratio 0.03) on 06/23/2019, 451.05(ratio 0.02) on02/19/2021, and47.2 (ratio 0.13) on 10/25/2019.  24 hour UPEPon 06/03/2019 revealed kappa free light chains95.76,lambda free light chains1,260.71, andratio 0.08.24 hourUPEPon 02/22/2021revealed totalproteinof748m/24 hrs withlambdafree light chains 1,084.111mL andratioof0.10(1.03-31.76). M spike inurinewas46.1%(36115m4 hrs).  Bone surveyon 04/08/2016 and11/28/2018 revealed no definite lytic lesion seen in the visualized skeleton.Bone surveyon 11/19/2018 revealed no suspicious lucent lesionsand no acute bony abnormality.PET scanon 07/12/2019 revealed no focal metabolic activity to suggest active myeloma within the skeleton. There wereno lytic lesions identified on the CT  portion of the examorsoft tissue plasmacytomas. There was no evidence of multiple myeloma.  Pretreatment RBC phenotypeon 09/23/2019 waspositivefor C, e, DUFFY B, KIDD B, M, S, and s antigen; negativefor c, E, KELL, DUFFY A, KIDD A, and N antigen.  Sheis day 49 ofcycle #2 daratumumab and hyaluronidase-fihj, Pomalyst, and Decadron (DPd)(09/27/2019 - 10/25/2019).  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 Zometa was discontinued in 01/2017. She has chronic nauseaon Phenergan.  She has B12 deficiency. B12 was 254 on 04/09/2017, 295 on 08/20/2018, and 391 on 10/08/2018. Shewasonoral B12.She received B12 monthly (last05/20/2021).Folate was18.6on 02/08/2019.  She has iron deficiency. Ferritin was 32 on 07/01/2019. She received Venoferon 07/15/2019 and 07/22/2019.  [facility] for fever and neutropenia. Cultures were negative. CXR was negative. She received broad spectrum antibiotics and daily Granix. She received IVF for acute renal insufficiency due to diarrhea and dehydration. Creatine was 1.66 on admission and 1.12 on discharge.  She received her second COVID-19vaccineon 10/09/2019.  Symptomatically, she is feeling better.  She completed her antibiotics.  Her yeast infection has cleared on fluconazole.  She is ready to begin cycle #3 chemotherapy.   Plan: 1.   Labs today: CBC with diff, CMP, Mg. 2. Stage III multiple myeloma Clinically,she is doing well. Lambda free light chains have improved from451.05on02/19/2021to 47.2after cycle #1. She is day1of cycle #3 daratumumab plus Pomalyst and dexamethasone (DPd). She is tolerating treatment without progressive neuropathy.             Labs reviewed.  Begin day 1 daratumumab SQ today.               Continue Pomalyst and Decadron.             Discuss close monitoring of  counts given initial 2 cycles with infection/fever and neutropenia.             Treatment plan:                          Pomalidomide (Pomalyst) 3 mg po q day 1-21.                         Decadron 20 mg po day 1-2, day 8-9, 15-16, and 22-23 with cycle #1 and 2. Decadron 20 mg po on days 1-2 and 15-16 x 4 cycles (if needed). Daratumumab and hyaluronidase-fihj (Darzalez Faspro) 1800 mg SQ on days  1, 8, 15, and 22. Premeds for [employer]:  Singulair 10 mg po day before, day of, and 2 days. Ondansetron 8 mg po,Benadryl 50 mg po, andTylenol 650 mg po.             She confirmed that she took her premedications at home (Benadryl, Tylenol, Singulair).  Discuss symptom management.  She has antiemetics and pain medications at home to use on a prn bases.  Interventions are adequate.    3. Normocytic anemia Hematocrit31.0. Hemoglobin10.7. MCV94.2.Platelets128,000today. Ferritin128 on 10/04/2019. TSH was normal on02/20/2020  Creatinine 1.09. Etiology secondary to underlying myeloma and treatment. 4. Grade II peripheral neuropathy Etiologysecondary toprior chemotherapy. Neuropathy is stable in her toes.  Continue to monitor 5.B12 deficiency Continue B12 monthly(next due 12/16/2019). Folate was18.6on 02/08/2019. Check folate annually. 6. Hypomagnesemia, chronic Magnesium1.1 today. Magnesium 6 gm IV today. Continue IV magnesium supplementation twice weekly on Mondays and Thursdays. 7.Nausea Patient has chronic nausea relieved only with Phenergan. Patient receives Phenergan 25 mg IV weekly with  treatment. 8.Datatumumab today. 9.   RTC on 06/14 for labs (Mg) and IV Mg. 10.   RTC in 1 week for MD assessment, labs (CBC with diff, CMP, Mg), daratumumab and IV Mg.  I discussed the assessment and treatment plan with the patient.  The patient was provided an opportunity to ask questions and all were answered.  The patient agreed with the plan and demonstrated an understanding of the instructions.  The patient was advised to call back if the symptoms worsen or if the condition fails to improve as anticipated.   Lequita Asal, MD, PhD    12/09/2019, 10:04 AM  I, Selena Batten, am acting as scribe for [employer]. Mike Gip, MD, PhD.  I, Saamiya Jeppsen C. Mike Gip, MD, have reviewed the above documentation for accuracy and completeness, and I agree with the above.

## 2019-12-09 ENCOUNTER — Inpatient Hospital Stay: Payer: Medicare PPO

## 2019-12-09 ENCOUNTER — Other Ambulatory Visit: Payer: Self-pay

## 2019-12-09 ENCOUNTER — Inpatient Hospital Stay (HOSPITAL_BASED_OUTPATIENT_CLINIC_OR_DEPARTMENT_OTHER): Payer: Medicare PPO | Admitting: Hematology and Oncology

## 2019-12-09 ENCOUNTER — Encounter: Payer: Self-pay | Admitting: Hematology and Oncology

## 2019-12-09 VITALS — BP 125/78 | HR 90 | Temp 96.6°F | Wt 142.4 lb

## 2019-12-09 VITALS — BP 144/80 | HR 84 | Temp 97.0°F | Resp 16

## 2019-12-09 DIAGNOSIS — T451X5A Adverse effect of antineoplastic and immunosuppressive drugs, initial encounter: Secondary | ICD-10-CM | POA: Diagnosis not present

## 2019-12-09 DIAGNOSIS — I129 Hypertensive chronic kidney disease with stage 1 through stage 4 chronic kidney disease, or unspecified chronic kidney disease: Secondary | ICD-10-CM | POA: Diagnosis not present

## 2019-12-09 DIAGNOSIS — C9000 Multiple myeloma not having achieved remission: Secondary | ICD-10-CM

## 2019-12-09 DIAGNOSIS — Z5112 Encounter for antineoplastic immunotherapy: Secondary | ICD-10-CM

## 2019-12-09 DIAGNOSIS — Z7189 Other specified counseling: Secondary | ICD-10-CM | POA: Diagnosis not present

## 2019-12-09 DIAGNOSIS — D649 Anemia, unspecified: Secondary | ICD-10-CM | POA: Diagnosis not present

## 2019-12-09 DIAGNOSIS — G620 Drug-induced polyneuropathy: Secondary | ICD-10-CM | POA: Diagnosis not present

## 2019-12-09 DIAGNOSIS — N183 Chronic kidney disease, stage 3 unspecified: Secondary | ICD-10-CM | POA: Diagnosis not present

## 2019-12-09 DIAGNOSIS — E538 Deficiency of other specified B group vitamins: Secondary | ICD-10-CM | POA: Diagnosis not present

## 2019-12-09 LAB — COMPREHENSIVE METABOLIC PANEL
ALT: 27 U/L (ref 0–44)
AST: 27 U/L (ref 15–41)
Albumin: 4.4 g/dL (ref 3.5–5.0)
Alkaline Phosphatase: 80 U/L (ref 38–126)
Anion gap: 11 (ref 5–15)
BUN: 18 mg/dL (ref 8–23)
CO2: 24 mmol/L (ref 22–32)
Calcium: 9.4 mg/dL (ref 8.9–10.3)
Chloride: 104 mmol/L (ref 98–111)
Creatinine, Ser: 1.09 mg/dL — ABNORMAL HIGH (ref 0.44–1.00)
GFR calc Af Amer: >60 mL/min (ref >60)
GFR calc non Af Amer: 54 mL/min — ABNORMAL LOW (ref >60)
Glucose, Bld: 137 mg/dL — ABNORMAL HIGH (ref 70–99)
Potassium: 4.1 mmol/L (ref 3.5–5.1)
Sodium: 139 mmol/L (ref 135–145)
Total Bilirubin: 0.4 mg/dL (ref 0.3–1.2)
Total Protein: 7.1 g/dL (ref 6.5–8.1)

## 2019-12-09 LAB — MAGNESIUM: Magnesium: 1.1 mg/dL — ABNORMAL LOW (ref 1.7–2.4)

## 2019-12-09 LAB — CBC WITH DIFFERENTIAL/PLATELET
Abs Immature Granulocytes: 0.03 10*3/uL (ref 0.00–0.07)
Basophils Absolute: 0 10*3/uL (ref 0.0–0.1)
Basophils Relative: 1 %
Eosinophils Absolute: 0.2 10*3/uL (ref 0.0–0.5)
Eosinophils Relative: 5 %
HCT: 31 % — ABNORMAL LOW (ref 36.0–46.0)
Hemoglobin: 10.7 g/dL — ABNORMAL LOW (ref 12.0–15.0)
Immature Granulocytes: 1 %
Lymphocytes Relative: 14 %
Lymphs Abs: 0.6 10*3/uL — ABNORMAL LOW (ref 0.7–4.0)
MCH: 32.5 pg (ref 26.0–34.0)
MCHC: 34.5 g/dL (ref 30.0–36.0)
MCV: 94.2 fL (ref 80.0–100.0)
Monocytes Absolute: 0.3 10*3/uL (ref 0.1–1.0)
Monocytes Relative: 8 %
Neutro Abs: 3.1 10*3/uL (ref 1.7–7.7)
Neutrophils Relative %: 71 %
Platelets: 128 10*3/uL — ABNORMAL LOW (ref 150–400)
RBC: 3.29 MIL/uL — ABNORMAL LOW (ref 3.87–5.11)
RDW: 15.1 % (ref 11.5–15.5)
WBC: 4.3 10*3/uL (ref 4.0–10.5)
nRBC: 0 % (ref 0.0–0.2)

## 2019-12-09 MED ORDER — DARATUMUMAB-HYALURONIDASE-FIHJ 1800-30000 MG-UT/15ML ~~LOC~~ SOLN
1800.0000 mg | Freq: Once | SUBCUTANEOUS | Status: AC
  Administered 2019-12-09: 1800 mg via SUBCUTANEOUS
  Filled 2019-12-09: qty 15

## 2019-12-09 MED ORDER — PROMETHAZINE HCL 25 MG/ML IJ SOLN
25.0000 mg | Freq: Once | INTRAMUSCULAR | Status: AC
  Administered 2019-12-09: 25 mg via INTRAVENOUS

## 2019-12-09 MED ORDER — SODIUM CHLORIDE 0.9 % IV SOLN
6.0000 g | Freq: Once | INTRAVENOUS | Status: AC
  Administered 2019-12-09: 6 g via INTRAVENOUS
  Filled 2019-12-09: qty 12

## 2019-12-09 MED ORDER — HEPARIN SOD (PORK) LOCK FLUSH 100 UNIT/ML IV SOLN
500.0000 [IU] | Freq: Once | INTRAVENOUS | Status: AC
  Administered 2019-12-09: 500 [IU] via INTRAVENOUS
  Filled 2019-12-09: qty 5

## 2019-12-09 MED ORDER — DEXAMETHASONE 4 MG PO TABS
20.0000 mg | ORAL_TABLET | Freq: Once | ORAL | Status: AC
  Administered 2019-12-09: 20 mg via ORAL

## 2019-12-09 MED ORDER — SODIUM CHLORIDE 0.9 % IV SOLN
Freq: Once | INTRAVENOUS | Status: AC
  Filled 2019-12-09: qty 250

## 2019-12-09 MED ORDER — SODIUM CHLORIDE 0.9% FLUSH
10.0000 mL | INTRAVENOUS | Status: DC | PRN
  Administered 2019-12-09: 10 mL via INTRAVENOUS
  Filled 2019-12-09: qty 10

## 2019-12-11 ENCOUNTER — Other Ambulatory Visit: Payer: Self-pay | Admitting: Hematology and Oncology

## 2019-12-11 DIAGNOSIS — C9000 Multiple myeloma not having achieved remission: Secondary | ICD-10-CM

## 2019-12-13 ENCOUNTER — Other Ambulatory Visit: Payer: Self-pay

## 2019-12-13 ENCOUNTER — Inpatient Hospital Stay: Payer: Medicare PPO

## 2019-12-13 ENCOUNTER — Other Ambulatory Visit: Payer: Self-pay | Admitting: Hematology and Oncology

## 2019-12-13 DIAGNOSIS — D649 Anemia, unspecified: Secondary | ICD-10-CM | POA: Diagnosis not present

## 2019-12-13 DIAGNOSIS — T451X5A Adverse effect of antineoplastic and immunosuppressive drugs, initial encounter: Secondary | ICD-10-CM | POA: Diagnosis not present

## 2019-12-13 DIAGNOSIS — I129 Hypertensive chronic kidney disease with stage 1 through stage 4 chronic kidney disease, or unspecified chronic kidney disease: Secondary | ICD-10-CM | POA: Diagnosis not present

## 2019-12-13 DIAGNOSIS — C9000 Multiple myeloma not having achieved remission: Secondary | ICD-10-CM

## 2019-12-13 DIAGNOSIS — E538 Deficiency of other specified B group vitamins: Secondary | ICD-10-CM | POA: Diagnosis not present

## 2019-12-13 DIAGNOSIS — G620 Drug-induced polyneuropathy: Secondary | ICD-10-CM | POA: Diagnosis not present

## 2019-12-13 DIAGNOSIS — N183 Chronic kidney disease, stage 3 unspecified: Secondary | ICD-10-CM | POA: Diagnosis not present

## 2019-12-13 DIAGNOSIS — Z5112 Encounter for antineoplastic immunotherapy: Secondary | ICD-10-CM | POA: Diagnosis not present

## 2019-12-13 LAB — MAGNESIUM: Magnesium: 1.1 mg/dL — ABNORMAL LOW (ref 1.7–2.4)

## 2019-12-13 MED ORDER — HEPARIN SOD (PORK) LOCK FLUSH 100 UNIT/ML IV SOLN
500.0000 [IU] | Freq: Once | INTRAVENOUS | Status: AC
  Administered 2019-12-13: 500 [IU] via INTRAVENOUS
  Filled 2019-12-13: qty 5

## 2019-12-13 MED ORDER — SODIUM CHLORIDE 0.9% FLUSH
10.0000 mL | INTRAVENOUS | Status: DC | PRN
  Administered 2019-12-13: 10 mL via INTRAVENOUS
  Filled 2019-12-13: qty 10

## 2019-12-13 MED ORDER — SODIUM CHLORIDE 0.9 % IV SOLN
6.0000 g | Freq: Once | INTRAVENOUS | Status: AC
  Administered 2019-12-13: 6 g via INTRAVENOUS
  Filled 2019-12-13: qty 12

## 2019-12-13 MED ORDER — SODIUM CHLORIDE 0.9 % IV SOLN
Freq: Once | INTRAVENOUS | Status: AC
  Filled 2019-12-13: qty 250

## 2019-12-14 NOTE — Progress Notes (Signed)
[facility] [facility]  [address], [address] [place], [place] [address] Phone: [phone]  Fax: [phone]   Clinic Day:  12/16/2019  Referring physician: Berglund, Laura H, MD  Chief Complaint: Sarah [name] is a 64 y.o. 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  HPI: The patient was last seen in the medical oncology clinic on 12/09/2019. At that time, she felt "ok".  Hematocrit was 31, hemoglobin 10.7, platelets 128,000, WBC 4,300. Creatinine was 1.09. Magnesium was 1.1.  She received day 1 of cycle #3 daratumumab.  She received 4 g of IV magnesium.  She received 6 g of IV Magnesium on 12/13/2019. Magnesium was 1.1.  During the interim, she has been "ok".  She has developed a non-productive cough, which seems to start on the second week of Pomalyst. She reports fatigue and dry mouth. She has diarrhea all day yesterday and tries not to take medication for it. Her sinus infection has resolved.  The patient denies SOB, fever, and allergies. Her chronic numbness in her fingers and toes is stable.  Her diabetes in under control. She would like to proceed with a transplant work up.    Past Medical History:  Diagnosis Date  . Abnormal stress test 02/14/2016   Overview:  Added automatically from request for surgery [id number]  . Anemia   . Anxiety   . Arthritis   . Bicuspid aortic valve   . CHF (congestive heart failure) (HCC)   . CKD (chronic kidney disease) stage 3, GFR 30-59 ml/min   . Depression   . Diabetes mellitus (HCC)   . Dizziness   . Fatty liver   . Frequent falls   . GERD (gastroesophageal reflux disease)   . Gout   . Heart murmur   . History of blood transfusion   . History of bone marrow transplant (HCC)   . History of uterine fibroid   . Hx of cardiac catheterization 06/05/2016   Overview:  Normal coronaries  2017  . Hypertension   . Hypomagnesemia   . Multiple myeloma (HCC)   . Personal history of chemotherapy   . Renal cyst     Past Surgical History:  Procedure Laterality Date  . ABDOMINAL HYSTERECTOMY    . 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 CARDIAC ELECTROPHYSIOLOGY MAPPING AND ABLATION    . CARPAL TUNNEL RELEASE Bilateral   . CHOLECYSTECTOMY  2008  . COLONOSCOPY WITH PROPOFOL N/A 05/07/2017   Procedure: COLONOSCOPY WITH PROPOFOL;  Surgeon: Anna, Kiran, MD;  Location: [facility] ENDOSCOPY;  Service: Gastroenterology;  Laterality: N/A;  . ESOPHAGOGASTRODUODENOSCOPY (EGD) WITH PROPOFOL N/A 05/07/2017   Procedure: ESOPHAGOGASTRODUODENOSCOPY (EGD) WITH PROPOFOL;  Surgeon: Anna, Kiran, MD;  Location: [facility] ENDOSCOPY;  Service: Gastroenterology;  Laterality: N/A;  . FOOT SURGERY Bilateral   . INCONTINENCE SURGERY  2009  . INTERSTIM IMPLANT PLACEMENT    . other     over active bladder  . OTHER SURGICAL HISTORY     bladder stimulator   . PARTIAL HYSTERECTOMY  03/1996   fibroids  . PORTA CATH INSERTION N/A 03/10/2019   Procedure: PORTA CATH INSERTION;  Surgeon: Dew, Jason S, MD;  Location: [facility] INVASIVE CV LAB;  Service: Cardiovascular;  Laterality: N/A;  . TONSILLECTOMY  2007    Family History  Problem Relation Age of Onset  . Colon cancer Father   . Renal Disease Father   .   Diabetes Mellitus II Father   . Melanoma Paternal Grandmother   . Breast cancer Maternal Aunt 20  . Anemia Mother   . Heart disease Mother   . Heart failure Mother   . Renal Disease Mother   . Congestive Heart Failure Mother   . Heart disease Maternal Uncle   . Throat cancer Maternal Uncle   . Lung cancer Maternal Uncle   . Liver disease Maternal Uncle   . Heart failure Maternal Uncle   . Hearing loss Son 21       Suicide     Social History:  reports that she quit smoking about 28 years ago. Her smoking use included cigarettes. She has a 20.00 pack-year smoking history. She has never used smokeless tobacco.  She reports previous alcohol use. She reports that she does not use drugs. Patient has not had ETOH in several months. She is on disability. She notes exposure to perchloroethylene [facility]).She lives much of her adult life in [place]. She was married with 2 sons. Her husband passed away. Her 2 sons took their own lives (age 78 and 79). She worked in [employer] in [employer]. She went back to school and earned an [employer] and works for [employer] for several years.She lives[place] her sistersin [place]. The patient is alone today.  Allergies:  Allergies  Allergen Reactions  . Oxycodone-Acetaminophen Anaphylaxis    Swelling and rash  . Celebrex [Celecoxib] Diarrhea  . Codeine   . Benadryl [Diphenhydramine] Palpitations  . Morphine Itching and Rash  . Ondansetron Diarrhea  . Tylenol [Acetaminophen] Itching and Rash    Current Medications: Current Outpatient Medications  Medication Sig Dispense Refill  . acetaminophen (TYLENOL) 500 MG tablet Take 500 mg by mouth every 6 (six) hours as needed.    Marland Kitchen acyclovir (ZOVIRAX) 400 MG tablet Take 1 tablet (400 mg total) by mouth 2 (two) times daily. 60 tablet 11  . allopurinol (ZYLOPRIM) 100 MG tablet TAKE 1 TABLET(100 MG) BY MOUTH DAILY as needed 90 tablet 1  . aspirin 81 MG chewable tablet Chew 1 tablet (81 mg total) by mouth daily. 30 tablet 0  . dexamethasone (DECADRON) 4 MG tablet Take 5 tablets (77m) on days 2, 9, 16, 23 of cycle 1 and 2 Take 5 tablets (276m on days 2, 16 of cycle 3,4,5 and 6 Take 5 tablets (2037mon days 2 of cycles 7 and beyond 120 tablet 0  . diclofenac sodium (VOLTAREN) 1 % GEL Apply 2 g topically 4 (four) times daily. 100 g 1  . diphenhydrAMINE (BENADRYL) 50 MG capsule Take 50 mg by mouth as needed.    . diphenoxylate-atropine (LOMOTIL) 2.5-0.025 MG tablet Take 2 tablets by mouth daily as needed for diarrhea or loose stools. 45 tablet 2  . fentaNYL (DURAGESIC - DOSED MCG/HR) 25 MCG/HR patch Place 25  mcg onto the skin every 3 (three) days.     . fexofenadine (ALLEGRA) 180 MG tablet TAKE 1 TABLET(180 MG) BY MOUTH DAILY 30 tablet 5  . FLUoxetine (PROZAC) 40 MG capsule TAKE 1 CAPSULE EVERY DAY 90 capsule 1  . fluticasone (FLONASE) 50 MCG/ACT nasal spray Place 2 sprays into both nostrils daily. 16 g 2  . furosemide (LASIX) 20 MG tablet Take 20 mg by mouth as needed.     . gMarland Kitchenucose blood (ONE TOUCH ULTRA TEST) test strip     . hydrOXYzine (ATARAX/VISTARIL) 10 MG tablet TAKE 1 TABLET(10 MG) BY MOUTH THREE TIMES DAILY AS NEEDED 90 tablet 0  .  lactulose (CHRONULAC) 10 GM/15ML solution     . lidocaine-prilocaine (EMLA) cream APPLY TOPICALLY AS NEEDED. 30 g 0  . lisinopril (ZESTRIL) 10 MG tablet Take 10 mg by mouth once.    . metFORMIN (GLUCOPHAGE-XR) 500 MG 24 hr tablet Take 1 tablet (500 mg total) by mouth daily with breakfast. 90 tablet 1  . Misc Natural Products (OSTEO BI-FLEX ADV TRIPLE ST PO) Take 2 tablets by mouth daily.    . montelukast (SINGULAIR) 10 MG tablet Take 1 tablet by mouth on the day before treatment and for 2 days after treatment. 30 tablet 0  . Multiple Vitamins-Minerals (HAIR/SKIN/NAILS/BIOTIN PO) Take 1 capsule by mouth 3 (three) times daily.    . NARCAN 4 MG/0.1ML LIQD nasal spray kit 1 spray.    . Omega 3-6-9 Fatty Acids (OMEGA 3-6-9 COMPLEX) CAPS Take by mouth.    . omeprazole (PRILOSEC) 40 MG capsule TAKE 1 CAPSULE EVERY DAY 90 capsule 3  . POMALYST 3 MG capsule TAKE 1 CAPSULE BY MOUTH EVERY DAY 21 capsule 0  . promethazine (PHENERGAN) 25 MG tablet Take 1 tablet (25 mg total) by mouth every 6 (six) hours as needed for nausea or vomiting. 90 tablet 0  . promethazine-dextromethorphan (PROMETHAZINE-DM) 6.25-15 MG/5ML syrup Take 5 mLs by mouth 4 (four) times daily as needed for cough. 240 mL 0  . tiZANidine (ZANAFLEX) 4 MG tablet Take 1 tablet (4 mg total) by mouth every 8 (eight) hours as needed for muscle spasms. 270 tablet 1  . traZODone (DESYREL) 100 MG tablet TAKE 1 TABLET  AT BEDTIME  FOR  SLEEP 90 tablet 1  . fluconazole (DIFLUCAN) 150 MG tablet Take 1 tablet (150 mg total) by mouth daily. (Patient not taking: Reported on 12/16/2019) 1 tablet 0   No current facility-administered medications for this visit.    Review of Systems  Constitutional: Positive for malaise/fatigue (drained). Negative for chills, diaphoresis, fever and weight loss (up 2 lbs).       Doing okay.  HENT: Negative for congestion, ear discharge, ear pain, hearing loss, nosebleeds, sinus pain, sore throat and tinnitus.        Dry mouth, using biotine. Sinus drainage.  Eyes: Negative.  Negative for blurred vision.  Respiratory: Positive for cough (non productive). Negative for hemoptysis, sputum production and shortness of breath.   Cardiovascular: Negative.  Negative for chest pain, palpitations and leg swelling.  Gastrointestinal: Positive for diarrhea (occasional). Negative for abdominal pain, blood in stool, constipation, heartburn, melena, nausea (chronic s/p gallbladder surgery, controlled with medication, better) and vomiting.       Eating small meals. Drinking protein shakes.  Genitourinary: Negative for dysuria, frequency, hematuria and urgency.  Musculoskeletal: Positive for back pain (chroni; paraspinal tenderness; upper back). Negative for falls, joint pain, myalgias and neck pain.  Skin: Negative for itching and rash.  Neurological: Positive for sensory change (chronic numbness in fingers are better; toes are worse with intermittent pain). Negative for dizziness, tingling, weakness and headaches.  Endo/Heme/Allergies: Negative.  Negative for environmental allergies. Does not bruise/bleed easily.       Diabetes, under good control.  Psychiatric/Behavioral: Negative.  Negative for depression and memory loss. The patient is not nervous/anxious and does not have insomnia.   All other systems reviewed and are negative.   Performance status (ECOG): 2  Vitals Blood pressure 104/69,  pulse 80, temperature 98.1 F (36.7 C), temperature source Tympanic, weight 144 lb 8.2 oz (65.5 kg), SpO2 99 %.   Physical Exam Vitals and nursing   note reviewed.  Constitutional:      General: She is not in acute distress.    Appearance: She is well-developed. She is not diaphoretic.  HENT:     Head: Normocephalic and atraumatic.     Mouth/Throat:     Mouth: Mucous membranes are dry.     Pharynx: No oropharyngeal exudate.  Eyes:     General: No scleral icterus.    Conjunctiva/sclera: Conjunctivae normal.     Pupils: Pupils are equal, round, and reactive to light.     Comments: Glasses. Brown eyes.   Cardiovascular:     Rate and Rhythm: Normal rate and regular rhythm.     Heart sounds: Normal heart sounds. No murmur heard.   Pulmonary:     Effort: Pulmonary effort is normal. No respiratory distress.     Breath sounds: Normal breath sounds. No wheezing or rales.  Chest:     Chest wall: No tenderness.  Abdominal:     General: Bowel sounds are normal. There is no distension.     Palpations: Abdomen is soft. There is no mass.     Tenderness: There is no abdominal tenderness. There is no guarding or rebound.  Musculoskeletal:        General: No tenderness. Normal range of motion.     Cervical back: Normal range of motion and neck supple.  Lymphadenopathy:     Head:     Right side of head: No preauricular, posterior auricular or occipital adenopathy.     Left side of head: No preauricular, posterior auricular or occipital adenopathy.     Cervical: No cervical adenopathy.     Upper Body:     Right upper body: No supraclavicular or axillary adenopathy.     Left upper body: No supraclavicular or axillary adenopathy.     Lower Body: No right inguinal adenopathy. No left inguinal adenopathy.  Skin:    General: Skin is warm and dry.  Neurological:     Mental Status: She is alert and oriented to person, place, and time. Mental status is at baseline.  Psychiatric:        Mood and  Affect: Mood normal.        Behavior: Behavior normal.        Thought Content: Thought content normal.        Judgment: Judgment normal.     Infusion on 12/16/2019  Component Date Value Ref Range Status  . Magnesium 12/16/2019 1.1* 1.7 - 2.4 mg/dL Final   Performed at [facility] [facility], [address] [place],  [address]  . WBC 12/16/2019 3.7* 4.0 - 10.5 K/uL Final  . RBC 12/16/2019 3.21* 3.87 - 5.11 MIL/uL Final  . Hemoglobin 12/16/2019 10.3* 12.0 - 15.0 g/dL Final  . HCT 12/16/2019 30.8* 36 - 46 % Final  . MCV 12/16/2019 96.0  80.0 - 100.0 fL Final  . MCH 12/16/2019 32.1  26.0 - 34.0 pg Final  . MCHC 12/16/2019 33.4  30.0 - 36.0 g/dL Final  . RDW 12/16/2019 14.0  11.5 - 15.5 % Final  . Platelets 12/16/2019 129* 150 - 400 K/uL Final  . nRBC 12/16/2019 0.0  0.0 - 0.2 % Final  . Neutrophils Relative % 12/16/2019 67  % Final  . Neutro Abs 12/16/2019 2.5  1.7 - 7.7 K/uL Final  . Lymphocytes Relative 12/16/2019 13  % Final  . Lymphs Abs 12/16/2019 0.5* 0.7 - 4.0 K/uL Final  . Monocytes Relative 12/16/2019 7  % Final  .   Monocytes Absolute 12/16/2019 0.3  0 - 1 K/uL Final  . Eosinophils Relative 12/16/2019 12  % Final  . Eosinophils Absolute 12/16/2019 0.4  0 - 0 K/uL Final  . Basophils Relative 12/16/2019 0  % Final  . Basophils Absolute 12/16/2019 0.0  0 - 0 K/uL Final  . Immature Granulocytes 12/16/2019 1  % Final  . Abs Immature Granulocytes 12/16/2019 0.02  0.00 - 0.07 K/uL Final   Performed at [facility], [address] [place], [place] [address]  . Sodium 12/16/2019 135  135 - 145 mmol/L Final  . Potassium 12/16/2019 4.2  3.5 - 5.1 mmol/L Final  . Chloride 12/16/2019 104  98 - 111 mmol/L Final  . CO2 12/16/2019 23  22 - 32 mmol/L Final  . Glucose, Bld 12/16/2019 127* 70 - 99 mg/dL Final   Glucose reference range applies only to samples taken after fasting for at least 8 hours.  . BUN 12/16/2019 25* 8 - 23 mg/dL Final  . Creatinine, Ser  12/16/2019 1.06* 0.44 - 1.00 mg/dL Final  . Calcium 12/16/2019 8.7* 8.9 - 10.3 mg/dL Final  . Total Protein 12/16/2019 6.6  6.5 - 8.1 g/dL Final  . Albumin 12/16/2019 4.2  3.5 - 5.0 g/dL Final  . AST 12/16/2019 28  15 - 41 U/L Final  . ALT 12/16/2019 27  0 - 44 U/L Final  . Alkaline Phosphatase 12/16/2019 77  38 - 126 U/L Final  . Total Bilirubin 12/16/2019 0.4  0.3 - 1.2 mg/dL Final  . GFR calc non Af Amer 12/16/2019 56* >60 mL/min Final  . GFR calc Af Amer 12/16/2019 >60  >60 mL/min Final  . Anion gap 12/16/2019 8  5 - 15 Final   Performed at [place] [facility], [address] [place], [place] [address]    Assessment:  Sincerity Gaughran is a 64 y.o. 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 Bone marrow revealed 80% plasma cells. Lambda free light chains were 1340. She had nephrotic range proteinuria. She initially underwent induction with RVD. Revlimid maintenance was discontinued on 01/21/2017 secondary to intolerance.   Bone marrowaspirate andbiopsyon 01/18/2021revealed anormocellularmarrow withbut increased lambda-restricted plasma cells (9% aspirate, 40% CD138 immunohistochemistry).Findingswereconsistent with recurrent plasma cell myeloma.Flow cytometry revealed no monoclonal B-cell or phenotypically aberrant T-cell population. Cytogeneticswere 46, XX (normal). 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  M-spikehas been followed: 0 on 04/02/2016 -06/23/2019; 0.2 on 09/02/2017 and 0.1 on 11/25/2019.  Lambda light chainshave been followed: 22.2 (ratio 0.56) on 07/03/2017, 30.8 (ratio 0.78) on 09/02/2017, 36.9 (ratio 0.40) on 10/21/2017, 37.4 (ratio 0.41) on 12/16/2017, 70.7(ratio 0.31) on 02/17/2018, 64.2 (ratio 0.27) on 04/07/2018, 78.9 (ratio 0.18) on 05/26/2018, 128.8 (ratio 0.17) on 08/06/2018, 181.5 (ratio 0.13) on 10/08/2018, 130.9 (ratio  0.13) on 10/20/2018, 160.7 (ratio 0.10)on 12/09/2018, 236.6 (ratio 0.07) on 02/01/2019, 363.6 (ratio 0.04) on 03/22/2019, 404.8 (ratio 0.04) on 04/05/2019, 420.7 (ratio 0.03) on 05/24/2019, 573.4 (ratio 0.03) on 06/23/2019, 451.05(ratio 0.02) on02/19/2021, 47.2 (ratio 0.13) on 10/25/2019, and 22.4 (ratio 0.21) on 11/25/2019.  24 hour UPEPon 06/03/2019 revealed kappa free light chains95.76,lambda free light chains1,260.71, andratio 0.08.24 hourUPEPon 02/22/2021revealed totalproteinof782mg/24 hrs withlambdafree light chains 1,084.16mg/L andratioof0.10(1.03-31.76). M spike inurinewas46.1%(361mg/24 hrs).  Bone surveyon 04/08/2016 and11/28/2018 revealed no definite lytic lesion seen in the visualized skeleton.Bone surveyon 11/19/2018 revealed no suspicious lucent lesionsand no acute bony abnormality.PET scanon 07/12/2019 revealed no focal metabolic activity to suggest active   myeloma within the skeleton. There wereno lytic lesions identified on the CT portion of the examorsoft tissue plasmacytomas. There was no evidence of multiple myeloma.  Pretreatment RBC phenotypeon 09/23/2019 waspositivefor C, e, DUFFY B, KIDD B, M, S, and s antigen; negativefor c, E, KELL, DUFFY A, KIDD A, and N antigen.  Sheis day 8 ofcycle #3 daratumumab and hyaluronidase-fihj, Pomalyst, and Decadron (DPd)(09/27/2019 - 10/25/2019; 12/09/2019).  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 Zometa was discontinued in 01/2017. She has chronic nauseaon Phenergan.  She has B12 deficiency. B12 was 254 on 04/09/2017, 295 on 08/20/2018, and 391 on 10/08/2018. Shewasonoral B12.She received B12 monthly (last05/20/2021).Folate was18.6on 02/08/2019.  She has iron deficiency. Ferritin was 32 on 07/01/2019. She received Venoferon 07/15/2019 and 07/22/2019.  [facility] and [facility]ropenia. Cultures  were negative. CXR was negative. She received broad spectrum antibiotics and daily Granix. She received IVF for acute renal insufficiency due to diarrhea and dehydration. Creatine was 1.66 on admission and 1.12 on discharge.  She received her second COVID-19vaccineon 10/09/2019.  Symptomatically, she feels "ok".  She has developed a non-productive cough. She had diarrhea all day yesterday.  She denies any increased shortness of breath or fever.  Neuropathy is stable. Exam is stable.  Plan: 1.   Labs:  CBC with diff, CMP, Mg. 2. Stage III multiple myeloma Clinically, she appears to be doing fairly well Lambda free light chains have improved from451.05on02/19/2021to 47.2after cycle #1. She is day8of cycle # 3 daratumumab plus Pomalyst and dexamethasone (DPd). She began cycle # 3 on 12/09/2019.   Neuropathy is stable.   Energy level is modest.                         She has no active infection.             Review labs.  Day 8 daratumumab SQ today.               Continue Pomalyst and Decadron.             Continue to monitor counts closely given 1st-2 cycles truncated early secondary to infection (fever neutropenia then pneumonia).             Treatment plan:                          Pomalidomide (Pomalyst) 3 mg po q day 1-21.                         Decadron 20 mg po day 1-2, day 8-9, 15-16, and 22-23 with cycle #1 and 2. Decadron 20 mg po on days 1-2 and 15-16 x 4 cycles (if needed). Daratumumab and hyaluronidase-fihj (Darzalez Faspro) 1800 mg SQ on days 1, 8, 15, and 22. Premeds for Darzalez Faspro:  Singulair 10 mg po day before, day of, and 2 days. Ondansetron 8 mg po,Benadryl 50 mg po, andTylenol 650 mg po.             Patient confirmed that she took her  premedications at home.             Continue current dose of Pomalyst 3 mg a day.  If counts drop with this cycle, will decrease Pomalyst to 2 mg a day.             Discuss potential future   transplant.  Patient is interested in pursuing stem cell transplant.  Contact Dr. Fatima Sanger at [facility] regarding treatment plan   Discuss symptom management.  She has antiemetics and pain medications at home to use on a as needed basis.  Interventions are adequate.    3. Normocytic anemia Hematocrit31.0. Hemoglobin10.7. MCV94.2.Platelets128,000today. Ferritin128 on 10/04/2019. TSH was normal on02/20/2020  Creatinine 1.10(CrCl54.84m/minute). Etiology of anemia secondary to underlying myeloma and treatment.   Anemia has been better with this cycle just seeing improvement in her myeloma. 4. Grade II peripheral neuropathy Etiology secondary to prior chemotherapy. Neuropathy in toes is stable. Continue to monitor. 5.B12 deficiency Patient last received B12 on 11/18/2019.  Continue B12 monthly (due today). Folate was18.6on 02/08/2019. Check folate annually. 6. Hypomagnesemia, chronic Magnesium1.1 today. Magnesium 6 g IV today. Continue IV magnesium supplementation twice weekly (Mondays and Thursdays).   7.Nausea Patient has chronic nausea relieved only with Phenergan. Patient will receive Phenergan 25 mg IV today. 8.   Day 8 of cycle #3 daratumumab SQ today. 9.   RTC on 12/20/2019 for labs (Mg) and IV Mg. 10.   RTC in 1 week for MD assessment, labs (CBC with diff, CMP, Mg), and day 15 of cycle #3 daratumumab.  I discussed the assessment and treatment plan with the patient.  The patient was provided an opportunity to ask questions and all were answered.  The  patient agreed with the plan and demonstrated an understanding of the instructions.  The patient was advised to call back if the symptoms worsen or if the condition fails to improve as anticipated.   MLequita Asal MD, PhD    12/16/2019, 10:35 AM  I, EDe Burrs am acting as sEducation administratorfor MCalpine Corporation CMike Gip MD, PhD.  I, Melissa C. CMike Gip MD, have reviewed the above documentation for accuracy and completeness, and I agree with the above.

## 2019-12-15 NOTE — Progress Notes (Signed)
Patient denied any problems or concerns. °

## 2019-12-16 ENCOUNTER — Other Ambulatory Visit: Payer: Self-pay

## 2019-12-16 ENCOUNTER — Inpatient Hospital Stay: Payer: Medicare PPO

## 2019-12-16 ENCOUNTER — Inpatient Hospital Stay (HOSPITAL_BASED_OUTPATIENT_CLINIC_OR_DEPARTMENT_OTHER): Payer: Medicare PPO | Admitting: Hematology and Oncology

## 2019-12-16 ENCOUNTER — Encounter: Payer: Self-pay | Admitting: Hematology and Oncology

## 2019-12-16 ENCOUNTER — Other Ambulatory Visit: Payer: Self-pay | Admitting: Hematology and Oncology

## 2019-12-16 VITALS — BP 104/69 | HR 80 | Temp 98.1°F | Wt 144.5 lb

## 2019-12-16 DIAGNOSIS — C9000 Multiple myeloma not having achieved remission: Secondary | ICD-10-CM | POA: Diagnosis not present

## 2019-12-16 DIAGNOSIS — E538 Deficiency of other specified B group vitamins: Secondary | ICD-10-CM | POA: Diagnosis not present

## 2019-12-16 DIAGNOSIS — I129 Hypertensive chronic kidney disease with stage 1 through stage 4 chronic kidney disease, or unspecified chronic kidney disease: Secondary | ICD-10-CM | POA: Diagnosis not present

## 2019-12-16 DIAGNOSIS — R110 Nausea: Secondary | ICD-10-CM

## 2019-12-16 DIAGNOSIS — N183 Chronic kidney disease, stage 3 unspecified: Secondary | ICD-10-CM | POA: Diagnosis not present

## 2019-12-16 DIAGNOSIS — D649 Anemia, unspecified: Secondary | ICD-10-CM

## 2019-12-16 DIAGNOSIS — G620 Drug-induced polyneuropathy: Secondary | ICD-10-CM

## 2019-12-16 DIAGNOSIS — Z5112 Encounter for antineoplastic immunotherapy: Secondary | ICD-10-CM | POA: Diagnosis not present

## 2019-12-16 DIAGNOSIS — T451X5A Adverse effect of antineoplastic and immunosuppressive drugs, initial encounter: Secondary | ICD-10-CM | POA: Diagnosis not present

## 2019-12-16 LAB — CBC WITH DIFFERENTIAL/PLATELET
Abs Immature Granulocytes: 0.02 10*3/uL (ref 0.00–0.07)
Basophils Absolute: 0 10*3/uL (ref 0.0–0.1)
Basophils Relative: 0 %
Eosinophils Absolute: 0.4 10*3/uL (ref 0.0–0.5)
Eosinophils Relative: 12 %
HCT: 30.8 % — ABNORMAL LOW (ref 36.0–46.0)
Hemoglobin: 10.3 g/dL — ABNORMAL LOW (ref 12.0–15.0)
Immature Granulocytes: 1 %
Lymphocytes Relative: 13 %
Lymphs Abs: 0.5 10*3/uL — ABNORMAL LOW (ref 0.7–4.0)
MCH: 32.1 pg (ref 26.0–34.0)
MCHC: 33.4 g/dL (ref 30.0–36.0)
MCV: 96 fL (ref 80.0–100.0)
Monocytes Absolute: 0.3 10*3/uL (ref 0.1–1.0)
Monocytes Relative: 7 %
Neutro Abs: 2.5 10*3/uL (ref 1.7–7.7)
Neutrophils Relative %: 67 %
Platelets: 129 10*3/uL — ABNORMAL LOW (ref 150–400)
RBC: 3.21 MIL/uL — ABNORMAL LOW (ref 3.87–5.11)
RDW: 14 % (ref 11.5–15.5)
WBC: 3.7 10*3/uL — ABNORMAL LOW (ref 4.0–10.5)
nRBC: 0 % (ref 0.0–0.2)

## 2019-12-16 LAB — COMPREHENSIVE METABOLIC PANEL
ALT: 27 U/L (ref 0–44)
AST: 28 U/L (ref 15–41)
Albumin: 4.2 g/dL (ref 3.5–5.0)
Alkaline Phosphatase: 77 U/L (ref 38–126)
Anion gap: 8 (ref 5–15)
BUN: 25 mg/dL — ABNORMAL HIGH (ref 8–23)
CO2: 23 mmol/L (ref 22–32)
Calcium: 8.7 mg/dL — ABNORMAL LOW (ref 8.9–10.3)
Chloride: 104 mmol/L (ref 98–111)
Creatinine, Ser: 1.06 mg/dL — ABNORMAL HIGH (ref 0.44–1.00)
GFR calc Af Amer: >60 mL/min (ref >60)
GFR calc non Af Amer: 56 mL/min — ABNORMAL LOW (ref >60)
Glucose, Bld: 127 mg/dL — ABNORMAL HIGH (ref 70–99)
Potassium: 4.2 mmol/L (ref 3.5–5.1)
Sodium: 135 mmol/L (ref 135–145)
Total Bilirubin: 0.4 mg/dL (ref 0.3–1.2)
Total Protein: 6.6 g/dL (ref 6.5–8.1)

## 2019-12-16 LAB — MAGNESIUM: Magnesium: 1.1 mg/dL — ABNORMAL LOW (ref 1.7–2.4)

## 2019-12-16 MED ORDER — DEXAMETHASONE 4 MG PO TABS
20.0000 mg | ORAL_TABLET | Freq: Once | ORAL | Status: AC
  Administered 2019-12-16: 20 mg via ORAL
  Filled 2019-12-16: qty 5

## 2019-12-16 MED ORDER — DARATUMUMAB-HYALURONIDASE-FIHJ 1800-30000 MG-UT/15ML ~~LOC~~ SOLN
1800.0000 mg | Freq: Once | SUBCUTANEOUS | Status: AC
  Administered 2019-12-16: 1800 mg via SUBCUTANEOUS
  Filled 2019-12-16: qty 15

## 2019-12-16 MED ORDER — HEPARIN SOD (PORK) LOCK FLUSH 100 UNIT/ML IV SOLN
500.0000 [IU] | Freq: Once | INTRAVENOUS | Status: AC | PRN
  Administered 2019-12-16: 500 [IU]
  Filled 2019-12-16: qty 5

## 2019-12-16 MED ORDER — CYANOCOBALAMIN 1000 MCG/ML IJ SOLN
1000.0000 ug | Freq: Once | INTRAMUSCULAR | Status: AC
  Administered 2019-12-16: 1000 ug via INTRAMUSCULAR
  Filled 2019-12-16: qty 1

## 2019-12-16 MED ORDER — SODIUM CHLORIDE 0.9 % IV SOLN
Freq: Once | INTRAVENOUS | Status: AC
  Filled 2019-12-16: qty 250

## 2019-12-16 MED ORDER — MONTELUKAST SODIUM 10 MG PO TABS: ORAL_TABLET | ORAL | 0 refills | Status: DC

## 2019-12-16 MED ORDER — SODIUM CHLORIDE 0.9 % IV SOLN
6.0000 g | Freq: Once | INTRAVENOUS | Status: AC
  Administered 2019-12-16: 6 g via INTRAVENOUS
  Filled 2019-12-16: qty 12

## 2019-12-16 MED ORDER — PROMETHAZINE HCL 25 MG/ML IJ SOLN
25.0000 mg | Freq: Once | INTRAMUSCULAR | Status: AC
  Administered 2019-12-16: 25 mg via INTRAVENOUS
  Filled 2019-12-16: qty 1

## 2019-12-16 MED ORDER — HEPARIN SOD (PORK) LOCK FLUSH 100 UNIT/ML IV SOLN
INTRAVENOUS | Status: AC
  Filled 2019-12-16: qty 5

## 2019-12-16 NOTE — Progress Notes (Signed)
Patient reports she is starting to have chest congestion again.

## 2019-12-17 DIAGNOSIS — M47816 Spondylosis without myelopathy or radiculopathy, lumbar region: Secondary | ICD-10-CM | POA: Diagnosis not present

## 2019-12-17 DIAGNOSIS — M7062 Trochanteric bursitis, left hip: Secondary | ICD-10-CM | POA: Diagnosis not present

## 2019-12-17 DIAGNOSIS — Z79891 Long term (current) use of opiate analgesic: Secondary | ICD-10-CM | POA: Diagnosis not present

## 2019-12-17 DIAGNOSIS — M533 Sacrococcygeal disorders, not elsewhere classified: Secondary | ICD-10-CM | POA: Diagnosis not present

## 2019-12-17 DIAGNOSIS — M47817 Spondylosis without myelopathy or radiculopathy, lumbosacral region: Secondary | ICD-10-CM | POA: Diagnosis not present

## 2019-12-20 ENCOUNTER — Ambulatory Visit: Payer: Medicare PPO

## 2019-12-20 ENCOUNTER — Inpatient Hospital Stay: Payer: Medicare PPO

## 2019-12-20 ENCOUNTER — Other Ambulatory Visit: Payer: Medicare PPO

## 2019-12-20 ENCOUNTER — Other Ambulatory Visit: Payer: Self-pay

## 2019-12-20 DIAGNOSIS — E538 Deficiency of other specified B group vitamins: Secondary | ICD-10-CM | POA: Diagnosis not present

## 2019-12-20 DIAGNOSIS — I129 Hypertensive chronic kidney disease with stage 1 through stage 4 chronic kidney disease, or unspecified chronic kidney disease: Secondary | ICD-10-CM | POA: Diagnosis not present

## 2019-12-20 DIAGNOSIS — G620 Drug-induced polyneuropathy: Secondary | ICD-10-CM | POA: Diagnosis not present

## 2019-12-20 DIAGNOSIS — N183 Chronic kidney disease, stage 3 unspecified: Secondary | ICD-10-CM | POA: Diagnosis not present

## 2019-12-20 DIAGNOSIS — T451X5A Adverse effect of antineoplastic and immunosuppressive drugs, initial encounter: Secondary | ICD-10-CM | POA: Diagnosis not present

## 2019-12-20 DIAGNOSIS — C9000 Multiple myeloma not having achieved remission: Secondary | ICD-10-CM | POA: Diagnosis not present

## 2019-12-20 DIAGNOSIS — D649 Anemia, unspecified: Secondary | ICD-10-CM | POA: Diagnosis not present

## 2019-12-20 DIAGNOSIS — Z5112 Encounter for antineoplastic immunotherapy: Secondary | ICD-10-CM | POA: Diagnosis not present

## 2019-12-20 LAB — MAGNESIUM: Magnesium: 1.1 mg/dL — ABNORMAL LOW (ref 1.7–2.4)

## 2019-12-20 MED ORDER — MAGNESIUM SULFATE 4 GM/100ML IV SOLN
4.0000 g | Freq: Once | INTRAVENOUS | Status: AC
  Administered 2019-12-20: 4 g via INTRAVENOUS

## 2019-12-20 MED ORDER — SODIUM CHLORIDE 0.9% FLUSH
10.0000 mL | Freq: Once | INTRAVENOUS | Status: AC | PRN
  Administered 2019-12-20: 10 mL
  Filled 2019-12-20: qty 10

## 2019-12-20 MED ORDER — SODIUM CHLORIDE 0.9 % IV SOLN
Freq: Once | INTRAVENOUS | Status: AC
  Filled 2019-12-20: qty 250

## 2019-12-20 MED ORDER — HEPARIN SOD (PORK) LOCK FLUSH 100 UNIT/ML IV SOLN
500.0000 [IU] | Freq: Once | INTRAVENOUS | Status: AC | PRN
  Administered 2019-12-20: 500 [IU]
  Filled 2019-12-20: qty 5

## 2019-12-20 NOTE — Patient Instructions (Signed)
Magnesium Sulfate injection What is this medicine? MAGNESIUM SULFATE (mag NEE zee um SUL fate) is an electrolyte injection commonly used to treat low magnesium levels in your blood. It is also used to prevent or control seizures in women with preeclampsia or eclampsia. This medicine may be used for other purposes; ask your health care provider or pharmacist if you have questions. What should I tell my health care provider before I take this medicine? They need to know if you have any of these conditions:  heart disease  history of irregular heart beat  kidney disease  an unusual or allergic reaction to magnesium sulfate, medicines, foods, dyes, or preservatives  pregnant or trying to get pregnant  breast-feeding How should I use this medicine? This medicine is for infusion into a vein. It is given by a health care professional in a hospital or clinic setting. Talk to your pediatrician regarding the use of this medicine in children. While this drug may be prescribed for selected conditions, precautions do apply. Overdosage: If you think you have taken too much of this medicine contact a poison control center or emergency room at once. NOTE: This medicine is only for you. Do not share this medicine with others. What if I miss a dose? This does not apply. What may interact with this medicine? This medicine may interact with the following medications:  certain medicines for anxiety or sleep  certain medicines for seizures like phenobarbital  digoxin  medicines that relax muscles for surgery  narcotic medicines for pain This list may not describe all possible interactions. Give your health care provider a list of all the medicines, herbs, non-prescription drugs, or dietary supplements you use. Also tell them if you smoke, drink alcohol, or use illegal drugs. Some items may interact with your medicine. What should I watch for while using this medicine? Your condition will be  monitored carefully while you are receiving this medicine. You may need blood work done while you are receiving this medicine. What side effects may I notice from receiving this medicine? Side effects that you should report to your doctor or health care professional as soon as possible:  allergic reactions like skin rash, itching or hives, swelling of the face, lips, or tongue  facial flushing  muscle weakness  signs and symptoms of low blood pressure like dizziness; feeling faint or lightheaded, falls; unusually weak or tired  signs and symptoms of a dangerous change in heartbeat or heart rhythm like chest pain; dizziness; fast or irregular heartbeat; palpitations; breathing problems  sweating This list may not describe all possible side effects. Call your doctor for medical advice about side effects. You may report side effects to FDA at 1-800-FDA-1088. Where should I keep my medicine? This drug is given in a hospital or clinic and will not be stored at home. NOTE: This sheet is a summary. It may not cover all possible information. If you have questions about this medicine, talk to your doctor, pharmacist, or health care provider.  2020 Elsevier/Gold Standard (2016-01-03 12:31:42)  

## 2019-12-21 NOTE — Progress Notes (Signed)
[facility]  [address], [address] [place], [place] [address] Phone: [phone]  Fax: [phone]   Clinic Day:  12/23/2019  Referring physician: Glean Hess, MD  Chief Complaint: Sarah [name] is a 64 y.o. 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  HPI: The patient was last seen in the medical oncology clinic on 12/16/2019. At that time, she felt fatigued.  She had a non-productive cough. She had diarrhea the day before clinic. Her neuropathy was stable.  Hematocrit was 30.8, hemoglobin 10.3, MCV 96.0, platelets 129,000, WBC 3,700 (ANC 2,500).  She received daratumumab SQ.  She received 6 gm IV Magnesium and her monthly B12.  The patient saw Dr. Grandville Silos on 12/17/2019 for chronic back pain. Her Fentanyl was refilled. Follow up in 3 months.  She received 4 g of IV Magnesium on 12/20/2019.  Magnesium was 1.1.  During the interim, she has been "okay." Her appetite has improved greatly. She thinks that the protein shakes she drinks make her constipated. She is still having some nausea and occasional diarrhea.  Her neuropathy is about the same. She has a sore spot right above her umbilicus. She denies wheezing and rash.   Past Medical History:  Diagnosis Date  . Abnormal stress test 02/14/2016   Overview:  Added automatically from request for surgery [id number]  . Anemia   . Anxiety   . Arthritis   . Bicuspid aortic valve   . CHF (congestive heart failure) ([place])   . CKD (chronic kidney disease) stage 3, GFR 30-59 ml/min   . Depression   . Diabetes mellitus ([place])   . Dizziness   . Fatty liver   . Frequent falls   . GERD (gastroesophageal reflux disease)   . Gout   . Heart murmur   . History of blood transfusion   . History of bone marrow transplant ([place])   . History of uterine fibroid   . Hx of cardiac  catheterization 06/05/2016   Overview:  Normal coronaries 2017  . Hypertension   . Hypomagnesemia   . Multiple myeloma ([place])   . Personal history of chemotherapy   . Renal cyst     Past Surgical History:  Procedure Laterality Date  . ABDOMINAL HYSTERECTOMY    . 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 CARDIAC ELECTROPHYSIOLOGY MAPPING AND ABLATION    . CARPAL TUNNEL RELEASE Bilateral   . CHOLECYSTECTOMY  2008  . COLONOSCOPY WITH PROPOFOL N/A 05/07/2017   Procedure: COLONOSCOPY WITH PROPOFOL;  Surgeon: [name], MD;  Location: [facility] ENDOSCOPY;  Service: Gastroenterology;  Laterality: N/A;  . ESOPHAGOGASTRODUODENOSCOPY (EGD) WITH PROPOFOL N/A 05/07/2017   Procedure: ESOPHAGOGASTRODUODENOSCOPY (EGD) WITH PROPOFOL;  Surgeon: [name], MD;  Location: [facility] ENDOSCOPY;  Service: Gastroenterology;  Laterality: N/A;  . FOOT SURGERY Bilateral   . INCONTINENCE SURGERY  2009  . INTERSTIM IMPLANT PLACEMENT    . other     over active bladder  . OTHER SURGICAL HISTORY     bladder stimulator   . PARTIAL HYSTERECTOMY  03/1996   fibroids  . PORTA CATH INSERTION N/A 03/10/2019   Procedure: PORTA CATH INSERTION;  Surgeon: Algernon Huxley, MD;  Location: [place] CV LAB;  Service: Cardiovascular;  Laterality: N/A;  . TONSILLECTOMY  2007    Family History  Problem Relation Age of Onset  . Colon cancer Father   .  Renal Disease Father   . Diabetes Mellitus II Father   . Melanoma Paternal Grandmother   . Breast cancer Maternal Aunt 53  . Anemia Mother   . Heart disease Mother   . Heart failure Mother   . Renal Disease Mother   . Congestive Heart Failure Mother   . Heart disease Maternal Uncle   . Throat cancer Maternal Uncle   . Lung cancer Maternal Uncle   . Liver disease Maternal Uncle   . Heart failure Maternal Uncle   . Hearing loss Son 44       Suicide     Social History:  reports that she quit smoking about 28 years ago. Her smoking use included cigarettes. She has a 20.00 pack-year  smoking history. She has never used smokeless tobacco. She reports previous alcohol use. She reports that she does not use drugs. Patient has not had ETOH in several months. She is on disability. She notes exposure to perchloroethylene [facility]).She lives much of her adult life in [place]. She was married with 2 sons. Her husband passed away. Her 2 sons took their own lives (age 45 and 22). She worked in [employer] in [employer]. She went back to school and earned an [employer] and works for [employer] for several years.She liveswith her sistersin [place]. The patient is alone today.  Allergies:  Allergies  Allergen Reactions  . Oxycodone-Acetaminophen Anaphylaxis    Swelling and rash  . Celebrex [Celecoxib] Diarrhea  . Codeine   . Benadryl [Diphenhydramine] Palpitations  . Morphine Itching and Rash  . Ondansetron Diarrhea  . Tylenol [Acetaminophen] Itching and Rash    Current Medications: Current Outpatient Medications  Medication Sig Dispense Refill  . acetaminophen (TYLENOL) 500 MG tablet Take 500 mg by mouth every 6 (six) hours as needed.    Marland Kitchen acyclovir (ZOVIRAX) 400 MG tablet Take 1 tablet (400 mg total) by mouth 2 (two) times daily. 60 tablet 11  . allopurinol (ZYLOPRIM) 100 MG tablet TAKE 1 TABLET(100 MG) BY MOUTH DAILY as needed 90 tablet 1  . aspirin 81 MG chewable tablet Chew 1 tablet (81 mg total) by mouth daily. 30 tablet 0  . diclofenac sodium (VOLTAREN) 1 % GEL Apply 2 g topically 4 (four) times daily. 100 g 1  . diphenhydrAMINE (BENADRYL) 50 MG capsule Take 50 mg by mouth as needed.     . fentaNYL (DURAGESIC - DOSED MCG/HR) 25 MCG/HR patch Place 25 mcg onto the skin every 3 (three) days.     . fexofenadine (ALLEGRA) 180 MG tablet TAKE 1 TABLET(180 MG) BY MOUTH DAILY 30 tablet 5  . FLUoxetine (PROZAC) 40 MG capsule TAKE 1 CAPSULE EVERY DAY 90 capsule 1  . furosemide (LASIX) 20 MG tablet Take 20 mg by mouth as needed.     Marland Kitchen glucose blood (ONE TOUCH  ULTRA TEST) test strip     . hydrOXYzine (ATARAX/VISTARIL) 10 MG tablet TAKE 1 TABLET(10 MG) BY MOUTH THREE TIMES DAILY AS NEEDED 90 tablet 0  . lactulose (CHRONULAC) 10 GM/15ML solution     . lidocaine-prilocaine (EMLA) cream APPLY TOPICALLY AS NEEDED. 30 g 0  . lisinopril (ZESTRIL) 10 MG tablet Take 10 mg by mouth once.    . metFORMIN (GLUCOPHAGE-XR) 500 MG 24 hr tablet Take 1 tablet (500 mg total) by mouth daily with breakfast. 90 tablet 1  . Misc Natural Products (OSTEO BI-FLEX ADV TRIPLE ST PO) Take 2 tablets by mouth daily.    . montelukast (SINGULAIR) 10  MG tablet Take 1 tablet by mouth on the day before treatment and for 2 days after treatment. 30 tablet 0  . Multiple Vitamins-Minerals (HAIR/SKIN/NAILS/BIOTIN PO) Take 1 capsule by mouth 3 (three) times daily.    . Omega 3-6-9 Fatty Acids (OMEGA 3-6-9 COMPLEX) CAPS Take by mouth.    Marland Kitchen omeprazole (PRILOSEC) 40 MG capsule TAKE 1 CAPSULE EVERY DAY 90 capsule 3  . POMALYST 3 MG capsule TAKE 1 CAPSULE BY MOUTH EVERY DAY 21 capsule 0  . promethazine-dextromethorphan (PROMETHAZINE-DM) 6.25-15 MG/5ML syrup Take 5 mLs by mouth 4 (four) times daily as needed for cough. 240 mL 0  . tiZANidine (ZANAFLEX) 4 MG tablet Take 1 tablet (4 mg total) by mouth every 8 (eight) hours as needed for muscle spasms. 270 tablet 1  . traZODone (DESYREL) 100 MG tablet TAKE 1 TABLET AT BEDTIME  FOR  SLEEP 90 tablet 1  . dexamethasone (DECADRON) 4 MG tablet Take 5 tablets (12m) on days 2, 9, 16, 23 of cycle 1 and 2 Take 5 tablets (237m on days 2, 16 of cycle 3,4,5 and 6 Take 5 tablets (2030mon days 2 of cycles 7 and beyond (Patient not taking: Reported on 12/22/2019) 120 tablet 0  . diphenoxylate-atropine (LOMOTIL) 2.5-0.025 MG tablet Take 2 tablets by mouth daily as needed for diarrhea or loose stools. (Patient not taking: Reported on 12/22/2019) 45 tablet 2  . fluconazole (DIFLUCAN) 150 MG tablet Take 1 tablet (150 mg total) by mouth daily. (Patient not taking:  Reported on 12/16/2019) 1 tablet 0  . fluticasone (FLONASE) 50 MCG/ACT nasal spray Place 2 sprays into both nostrils daily. (Patient not taking: Reported on 12/22/2019) 16 g 2  . NARCAN 4 MG/0.1ML LIQD nasal spray kit 1 spray. (Patient not taking: Reported on 12/22/2019)    . promethazine (PHENERGAN) 25 MG tablet Take 1 tablet (25 mg total) by mouth every 6 (six) hours as needed for nausea or vomiting. (Patient not taking: Reported on 12/22/2019) 90 tablet 0   No current facility-administered medications for this visit.    Review of Systems  Constitutional: Negative for chills, diaphoresis, fever, malaise/fatigue and weight loss (up 2 lbs).       Doing "okay".  HENT: Negative for congestion, ear discharge, ear pain, hearing loss, nosebleeds, sinus pain, sore throat and tinnitus.   Eyes: Negative.  Negative for blurred vision.  Respiratory: Positive for cough (non productive, improving). Negative for hemoptysis, sputum production, shortness of breath and wheezing.   Cardiovascular: Negative.  Negative for chest pain, palpitations and leg swelling.  Gastrointestinal: Positive for abdominal pain (above umbilicus), constipation (due to protein drinks), diarrhea (occasional) and nausea (chronic s/p gallbladder surgery, controlled with medication, better). Negative for blood in stool, heartburn, melena and vomiting.       Appetite is improving. Drinking protein shakes.  Genitourinary: Negative for dysuria, frequency, hematuria and urgency.  Musculoskeletal: Negative for back pain (chroni; paraspinal tenderness; upper back), falls, joint pain, myalgias and neck pain.  Skin: Negative for itching and rash.  Neurological: Positive for sensory change (chronic numbness in fingers and toes, stable). Negative for dizziness, tingling, weakness and headaches.  Endo/Heme/Allergies: Negative.  Negative for environmental allergies. Does not bruise/bleed easily.       Diabetes, under good control.   Psychiatric/Behavioral: Negative.  Negative for depression and memory loss. The patient is not nervous/anxious and does not have insomnia.   All other systems reviewed and are negative.   Performance status (ECOG): 1  Vitals Blood pressure 116/72, pulse  77, temperature (!) 97.2 F (36.2 C), temperature source Tympanic, weight 147 lb 7.8 oz (66.9 kg), SpO2 99 %.   Physical Exam Vitals and nursing note reviewed.  Constitutional:      General: She is not in acute distress.    Appearance: She is well-developed. She is not diaphoretic.     Interventions: Face mask in place.  HENT:     Head: Normocephalic and atraumatic.     Mouth/Throat:     Mouth: Mucous membranes are moist.     Pharynx: Oropharynx is clear.  Eyes:     General: No scleral icterus.    Conjunctiva/sclera: Conjunctivae normal.     Pupils: Pupils are equal, round, and reactive to light.     Comments: Glasses. Brown eyes.   Cardiovascular:     Rate and Rhythm: Normal rate and regular rhythm.     Pulses: Normal pulses.     Heart sounds: Normal heart sounds. No murmur heard.   Pulmonary:     Effort: Pulmonary effort is normal. No respiratory distress.     Breath sounds: Normal breath sounds. No wheezing or rales.  Chest:     Chest wall: No tenderness.  Abdominal:     General: Bowel sounds are normal. There is no distension.     Palpations: Abdomen is soft. There is no mass.     Tenderness: There is no abdominal tenderness. There is no guarding or rebound.     Hernia: A hernia (incisional) is present.  Musculoskeletal:        General: No swelling or tenderness. Normal range of motion.     Cervical back: Normal range of motion and neck supple.  Lymphadenopathy:     Cervical: No cervical adenopathy.     Upper Body:     Right upper body: No supraclavicular adenopathy.     Left upper body: No supraclavicular adenopathy.  Skin:    General: Skin is warm and dry.  Neurological:     Mental Status: She is alert and  oriented to person, place, and time. Mental status is at baseline.  Psychiatric:        Mood and Affect: Mood normal.        Behavior: Behavior normal.        Thought Content: Thought content normal.        Judgment: Judgment normal.    Infusion on 12/23/2019  Component Date Value Ref Range Status  . Sodium 12/23/2019 136  135 - 145 mmol/L Final  . Potassium 12/23/2019 4.6  3.5 - 5.1 mmol/L Final  . Chloride 12/23/2019 105  98 - 111 mmol/L Final  . CO2 12/23/2019 23  22 - 32 mmol/L Final  . Glucose, Bld 12/23/2019 133* 70 - 99 mg/dL Final   Glucose reference range applies only to samples taken after fasting for at least 8 hours.  . BUN 12/23/2019 29* 8 - 23 mg/dL Final  . Creatinine, Ser 12/23/2019 1.04* 0.44 - 1.00 mg/dL Final  . Calcium 12/23/2019 8.7* 8.9 - 10.3 mg/dL Final  . Total Protein 12/23/2019 6.4* 6.5 - 8.1 g/dL Final  . Albumin 12/23/2019 4.0  3.5 - 5.0 g/dL Final  . AST 12/23/2019 24  15 - 41 U/L Final  . ALT 12/23/2019 29  0 - 44 U/L Final  . Alkaline Phosphatase 12/23/2019 80  38 - 126 U/L Final  . Total Bilirubin 12/23/2019 0.5  0.3 - 1.2 mg/dL Final  . GFR calc non Af Amer 12/23/2019 57* >60 mL/min  Final  . GFR calc Af Amer 12/23/2019 >60  >60 mL/min Final  . Anion gap 12/23/2019 8  5 - 15 Final   Performed at [facility], [address] [place], [place] [address]  . WBC 12/23/2019 3.8* 4.0 - 10.5 K/uL Final  . RBC 12/23/2019 3.23* 3.87 - 5.11 MIL/uL Final  . Hemoglobin 12/23/2019 10.4* 12.0 - 15.0 g/dL Final  . HCT 12/23/2019 30.5* 36 - 46 % Final  . MCV 12/23/2019 94.4  80.0 - 100.0 fL Final  . MCH 12/23/2019 32.2  26.0 - 34.0 pg Final  . MCHC 12/23/2019 34.1  30.0 - 36.0 g/dL Final  . RDW 12/23/2019 13.2  11.5 - 15.5 % Final  . Platelets 12/23/2019 111* 150 - 400 K/uL Final   Comment: Immature Platelet Fraction may be clinically indicated, consider ordering this additional test [id number]   . nRBC 12/23/2019 0.0  0.0 - 0.2 % Final  .  Neutrophils Relative % 12/23/2019 66  % Final  . Neutro Abs 12/23/2019 2.6  1.7 - 7.7 K/uL Final  . Lymphocytes Relative 12/23/2019 13  % Final  . Lymphs Abs 12/23/2019 0.5* 0.7 - 4.0 K/uL Final  . Monocytes Relative 12/23/2019 13  % Final  . Monocytes Absolute 12/23/2019 0.5  0 - 1 K/uL Final  . Eosinophils Relative 12/23/2019 6  % Final  . Eosinophils Absolute 12/23/2019 0.2  0 - 0 K/uL Final  . Basophils Relative 12/23/2019 1  % Final  . Basophils Absolute 12/23/2019 0.0  0 - 0 K/uL Final  . Immature Granulocytes 12/23/2019 1  % Final  . Abs Immature Granulocytes 12/23/2019 0.02  0.00 - 0.07 K/uL Final   Performed at [facility], [address] [place], [place] [address]  . Magnesium 12/23/2019 1.5* 1.7 - 2.4 mg/dL Final   Performed at [facility], [address], [place], [place] [address]    Assessment:  Nyemah [name] is a 64 y.o. 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 Bone marrow revealed 80% plasma cells. Lambda free light chains were 1340. She had nephrotic range proteinuria. She initially underwent induction with RVD. Revlimid maintenance was discontinued on 01/21/2017 secondary to intolerance.   Bone marrowaspirate andbiopsyon 01/18/2021revealed anormocellularmarrow withbut increased lambda-restricted plasma cells (9% aspirate, 40% CD138 immunohistochemistry).Findingswereconsistent with recurrent plasma cell myeloma.Flow cytometry revealed no monoclonal B-cell or phenotypically aberrant T-cell population. Cytogeneticswere 5, XX (normal). 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  M-spikehas been followed: 0 on 04/02/2016 -06/23/2019; 0.2 on 09/02/2017 and 0.1 on 11/25/2019.  Lambda light chainshave been followed: 22.2 (ratio 0.56) on 07/03/2017, 30.8 (ratio 0.78) on 09/02/2017, 36.9 (ratio 0.40) on  10/21/2017, 37.4 (ratio 0.41) on 12/16/2017, 70.7(ratio 0.31) on 02/17/2018, 64.2 (ratio 0.27) on 04/07/2018, 78.9 (ratio 0.18) on 05/26/2018, 128.8 (ratio 0.17) on 08/06/2018, 181.5 (ratio 0.13) on 10/08/2018, 130.9 (ratio 0.13) on 10/20/2018, 160.7 (ratio 0.10)on 12/09/2018, 236.6 (ratio 0.07) on 02/01/2019, 363.6 (ratio 0.04) on 03/22/2019, 404.8 (ratio 0.04) on 04/05/2019, 420.7 (ratio 0.03) on 05/24/2019, 573.4 (ratio 0.03) on 06/23/2019, 451.05(ratio 0.02) on02/19/2021, 47.2 (ratio 0.13) on 10/25/2019, and 22.4 (ratio 0.21) on 11/25/2019.  24 hour UPEPon 06/03/2019 revealed kappa free light chains95.76,lambda free light chains1,260.71, andratio 0.08.24 hourUPEPon 02/22/2021revealed totalproteinof773m/24 hrs withlambdafree light chains 1,084.170mL andratioof0.10(1.03-31.76). M spike inurinewas46.1%(36139m4 hrs).  Bone surveyon 04/08/2016 and11/28/2018 revealed no definite lytic lesion seen in the visualized skeleton.Bone surveyon 11/19/2018 revealed no  suspicious lucent lesionsand no acute bony abnormality.PET scanon 07/12/2019 revealed no focal metabolic activity to suggest active myeloma within the skeleton. There wereno lytic lesions identified on the CT portion of the examorsoft tissue plasmacytomas. There was no evidence of multiple myeloma.  Pretreatment RBC phenotypeon 09/23/2019 waspositivefor C, e, DUFFY B, KIDD B, M, S, and s antigen; negativefor c, E, KELL, DUFFY A, KIDD A, and N antigen.  Sheis day 15 ofcycle #3 daratumumab and hyaluronidase-fihj, Pomalyst, and Decadron (DPd)(09/27/2019 - 10/25/2019; 12/09/2019).  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 Zometa was discontinued in 01/2017. She has chronic nauseaon Phenergan.  She has B12 deficiency. B12 was 254 on 04/09/2017, 295 on 08/20/2018, and 391 on 10/08/2018. Shewasonoral B12.She received B12 monthly (last05/20/2021).Folate  was18.6on 02/08/2019.  She has iron deficiency. Ferritin was 32 on 07/01/2019. She received Venoferon 07/15/2019 and 07/22/2019.  She was admitted to [facility] 10/15/2019 - 10/20/2019 for fever and neutropenia. Cultures were negative. CXR was negative. She received broad spectrum antibiotics and daily Granix. She received IVF for acute renal insufficiency due to diarrhea and dehydration. Creatine was 1.66 on admission and 1.12 on discharge.  She received her second COVID-19vaccineon 10/09/2019.  Symptomatically, she feels "ok". Her appetite has improved greatly. She is still having some nausea and occasional diarrhea.  Her neuropathy is stable. She has a sore spot right above her umbilicus.  Exam is benign.  Plan: 1.   Labs today:  CBC with diff, CMP, Mg. 2. Stage III multiple myeloma Clinically,she is doing well Lambda free light chains have improved from451.05on02/19/2021to 47.2after cycle #1. She is day15of cycle #3 daratumumab plus Pomalyst and dexamethasone (DPd). She began cycle #3 Pomlayst on 12/09/2019. She continues to tolerate treatment well without progressive neuropathy.                         Nausea has not progressed on treatment.   She has intermittent diarrhea unrelated to treatment             Reviewed labs.  Counts are adequate.  Day 15 daratumumab SQ today.               Continue Pomalyst and Decadron.             Continue to monitor counts closely each week given first 2 cycles of treatment resulting in fever neutropenia then pneumonia             Treatment plan:                          Pomalidomide (Pomalyst) 3 mg po q day 1-21.                         Decadron 20 mg po day 1-2, day 8-9, 15-16, and 22-23 with cycle #1 and 2. Decadron 20 mg po on days 1-2 and 15-16 x 4 cycles (if needed). Daratumumab and  hyaluronidase-fihj (Darzalez Faspro) 1800 mg SQ on days 1, 8, 15, and 22. Premeds for [employer]:  Singulair 10 mg po day before, day of, and 2 days. Ondansetron 8 mg po,Benadryl 50 mg po, andTylenol 650 mg po.             Patient took her premedications at home (Benadryl, Tylenol, Singulair).             Refill acyclovir and Phenergan.  Anticipate decreasing dose of Pomalyst  to 2 mg a day if develops neutropenia with this cycle.  Patient has confirmed that she wishes to proceed with the autologous stem cell transplant (Dr. Margart Sickles office aware).             Discuss symptom management.  She has antiemetics and pain medications at home to use on a as needed basis.  Interventions are adequate. 3.   Normocytic anemia Hematocrit30.5. Hemoglobin10.4. MCV94.4.Platelets111,000today. Ferritin128 on 10/04/2019. TSH was normal on02/20/2020  Creatinine 1.10(CrCl54.60m/minute). Hemoglobin has stabilized with cycle #3 likely secondary to decreased myeloma involvement in marrow. 4. Grade II peripheral neuropathy Etiology is secondary to prior chemotherapy. Neuropathy has not progressed.  Continue to monitor. 5.B12 deficiency Patient receives B12 monthly (last 12/16/2019). Folate was18.6on 02/08/2019. Check folate annually 6. Hypomagnesemia, chronic Magnesium1.5 today. This is the highest magnesium level patient has had in some time. Magnesium 2 g IV today.  Continue magnesium support twice weekly (Mondays and Thursdays) given chronic magnesium wasting.  7.Nausea Patient has chronic nausea relieved only with Phenergan. Nausea has not progressed on current  treatment.  She receives Phenergan 25 mg IV when receiving treatment in clinic. 8.   Day 15 of cycle #3 daratumumab SQ today. 9.   Patient took premed: Tylenol, Benadryl, Singulair. 10.   RTC on 06/28 for labs (CBC with diff, Mg) and IV Mg. 11.   RTC in 1 week for MD assessmwnt, labs (CBC with diff, CMP, Mg), and day 22 of cycle #3 daratumumab.  I discussed the assessment and treatment plan with the patient.  The patient was provided an opportunity to ask questions and all were answered.  The patient agreed with the plan and demonstrated an understanding of the instructions.  The patient was advised to call back if the symptoms worsen or if the condition fails to improve as anticipated.   MLequita Asal MD, PhD    12/23/2019, 9:22 AM  I, EDe Burrs am acting as scribe for [employer] CMike Gip MD, PhD.  I, Melissa C. CMike Gip MD, have reviewed the above documentation for accuracy and completeness, and I agree with the above.

## 2019-12-22 ENCOUNTER — Other Ambulatory Visit: Payer: Self-pay

## 2019-12-22 ENCOUNTER — Encounter: Payer: Self-pay | Admitting: Hematology and Oncology

## 2019-12-22 NOTE — Progress Notes (Signed)
The patient c/o coughing at times and constipation. The patient name and DOB has been verified by phone today.

## 2019-12-23 ENCOUNTER — Encounter: Payer: Self-pay | Admitting: Hematology and Oncology

## 2019-12-23 ENCOUNTER — Other Ambulatory Visit: Payer: Self-pay

## 2019-12-23 ENCOUNTER — Inpatient Hospital Stay (HOSPITAL_BASED_OUTPATIENT_CLINIC_OR_DEPARTMENT_OTHER): Payer: Medicare PPO | Admitting: Hematology and Oncology

## 2019-12-23 ENCOUNTER — Inpatient Hospital Stay: Payer: Medicare PPO

## 2019-12-23 VITALS — BP 116/72 | HR 77 | Temp 97.2°F | Wt 147.5 lb

## 2019-12-23 DIAGNOSIS — R110 Nausea: Secondary | ICD-10-CM | POA: Diagnosis not present

## 2019-12-23 DIAGNOSIS — C9000 Multiple myeloma not having achieved remission: Secondary | ICD-10-CM | POA: Diagnosis not present

## 2019-12-23 DIAGNOSIS — D649 Anemia, unspecified: Secondary | ICD-10-CM

## 2019-12-23 DIAGNOSIS — T451X5A Adverse effect of antineoplastic and immunosuppressive drugs, initial encounter: Secondary | ICD-10-CM

## 2019-12-23 DIAGNOSIS — E538 Deficiency of other specified B group vitamins: Secondary | ICD-10-CM | POA: Diagnosis not present

## 2019-12-23 DIAGNOSIS — Z5112 Encounter for antineoplastic immunotherapy: Secondary | ICD-10-CM | POA: Diagnosis not present

## 2019-12-23 DIAGNOSIS — G620 Drug-induced polyneuropathy: Secondary | ICD-10-CM | POA: Diagnosis not present

## 2019-12-23 DIAGNOSIS — I129 Hypertensive chronic kidney disease with stage 1 through stage 4 chronic kidney disease, or unspecified chronic kidney disease: Secondary | ICD-10-CM | POA: Diagnosis not present

## 2019-12-23 DIAGNOSIS — N183 Chronic kidney disease, stage 3 unspecified: Secondary | ICD-10-CM | POA: Diagnosis not present

## 2019-12-23 LAB — COMPREHENSIVE METABOLIC PANEL
ALT: 29 U/L (ref 0–44)
AST: 24 U/L (ref 15–41)
Albumin: 4 g/dL (ref 3.5–5.0)
Alkaline Phosphatase: 80 U/L (ref 38–126)
Anion gap: 8 (ref 5–15)
BUN: 29 mg/dL — ABNORMAL HIGH (ref 8–23)
CO2: 23 mmol/L (ref 22–32)
Calcium: 8.7 mg/dL — ABNORMAL LOW (ref 8.9–10.3)
Chloride: 105 mmol/L (ref 98–111)
Creatinine, Ser: 1.04 mg/dL — ABNORMAL HIGH (ref 0.44–1.00)
GFR calc Af Amer: >60 mL/min (ref >60)
GFR calc non Af Amer: 57 mL/min — ABNORMAL LOW (ref >60)
Glucose, Bld: 133 mg/dL — ABNORMAL HIGH (ref 70–99)
Potassium: 4.6 mmol/L (ref 3.5–5.1)
Sodium: 136 mmol/L (ref 135–145)
Total Bilirubin: 0.5 mg/dL (ref 0.3–1.2)
Total Protein: 6.4 g/dL — ABNORMAL LOW (ref 6.5–8.1)

## 2019-12-23 LAB — CBC WITH DIFFERENTIAL/PLATELET
Abs Immature Granulocytes: 0.02 10*3/uL (ref 0.00–0.07)
Basophils Absolute: 0 10*3/uL (ref 0.0–0.1)
Basophils Relative: 1 %
Eosinophils Absolute: 0.2 10*3/uL (ref 0.0–0.5)
Eosinophils Relative: 6 %
HCT: 30.5 % — ABNORMAL LOW (ref 36.0–46.0)
Hemoglobin: 10.4 g/dL — ABNORMAL LOW (ref 12.0–15.0)
Immature Granulocytes: 1 %
Lymphocytes Relative: 13 %
Lymphs Abs: 0.5 10*3/uL — ABNORMAL LOW (ref 0.7–4.0)
MCH: 32.2 pg (ref 26.0–34.0)
MCHC: 34.1 g/dL (ref 30.0–36.0)
MCV: 94.4 fL (ref 80.0–100.0)
Monocytes Absolute: 0.5 10*3/uL (ref 0.1–1.0)
Monocytes Relative: 13 %
Neutro Abs: 2.6 10*3/uL (ref 1.7–7.7)
Neutrophils Relative %: 66 %
Platelets: 111 10*3/uL — ABNORMAL LOW (ref 150–400)
RBC: 3.23 MIL/uL — ABNORMAL LOW (ref 3.87–5.11)
RDW: 13.2 % (ref 11.5–15.5)
WBC: 3.8 10*3/uL — ABNORMAL LOW (ref 4.0–10.5)
nRBC: 0 % (ref 0.0–0.2)

## 2019-12-23 LAB — MAGNESIUM: Magnesium: 1.5 mg/dL — ABNORMAL LOW (ref 1.7–2.4)

## 2019-12-23 MED ORDER — PROMETHAZINE HCL 25 MG PO TABS: 25.0000 mg | ORAL_TABLET | Freq: Four times a day (QID) | ORAL | 0 refills | Status: DC | PRN

## 2019-12-23 MED ORDER — MAGNESIUM SULFATE 2 GM/50ML IV SOLN
2.0000 g | Freq: Once | INTRAVENOUS | Status: AC
  Administered 2019-12-23: 2 g via INTRAVENOUS
  Filled 2019-12-23: qty 50

## 2019-12-23 MED ORDER — PROMETHAZINE HCL 25 MG/ML IJ SOLN
25.0000 mg | Freq: Once | INTRAMUSCULAR | Status: AC
  Administered 2019-12-23: 25 mg via INTRAVENOUS
  Filled 2019-12-23: qty 1

## 2019-12-23 MED ORDER — SODIUM CHLORIDE 0.9 % IV SOLN
Freq: Once | INTRAVENOUS | Status: AC
  Filled 2019-12-23: qty 250

## 2019-12-23 MED ORDER — DARATUMUMAB-HYALURONIDASE-FIHJ 1800-30000 MG-UT/15ML ~~LOC~~ SOLN
1800.0000 mg | Freq: Once | SUBCUTANEOUS | Status: AC
  Administered 2019-12-23: 1800 mg via SUBCUTANEOUS
  Filled 2019-12-23: qty 15

## 2019-12-23 MED ORDER — DEXAMETHASONE 4 MG PO TABS
20.0000 mg | ORAL_TABLET | Freq: Once | ORAL | Status: AC
  Administered 2019-12-23: 20 mg via ORAL
  Filled 2019-12-23: qty 5

## 2019-12-23 MED ORDER — HEPARIN SOD (PORK) LOCK FLUSH 100 UNIT/ML IV SOLN
500.0000 [IU] | Freq: Once | INTRAVENOUS | Status: AC | PRN
  Administered 2019-12-23: 500 [IU]
  Filled 2019-12-23: qty 5

## 2019-12-23 MED ORDER — ACYCLOVIR 400 MG PO TABS: 400.0000 mg | ORAL_TABLET | Freq: Two times a day (BID) | ORAL | 11 refills | Status: DC

## 2019-12-24 ENCOUNTER — Other Ambulatory Visit: Payer: Self-pay | Admitting: *Deleted

## 2019-12-24 DIAGNOSIS — C9000 Multiple myeloma not having achieved remission: Secondary | ICD-10-CM

## 2019-12-24 MED ORDER — ACYCLOVIR 400 MG PO TABS: 400.0000 mg | ORAL_TABLET | Freq: Two times a day (BID) | ORAL | 11 refills | Status: DC

## 2019-12-27 ENCOUNTER — Inpatient Hospital Stay: Payer: Medicare PPO

## 2019-12-27 ENCOUNTER — Other Ambulatory Visit: Payer: Self-pay

## 2019-12-27 ENCOUNTER — Other Ambulatory Visit: Payer: Self-pay | Admitting: Hematology and Oncology

## 2019-12-27 ENCOUNTER — Other Ambulatory Visit: Payer: Self-pay | Admitting: *Deleted

## 2019-12-27 DIAGNOSIS — C9000 Multiple myeloma not having achieved remission: Secondary | ICD-10-CM

## 2019-12-27 DIAGNOSIS — E538 Deficiency of other specified B group vitamins: Secondary | ICD-10-CM | POA: Diagnosis not present

## 2019-12-27 DIAGNOSIS — I129 Hypertensive chronic kidney disease with stage 1 through stage 4 chronic kidney disease, or unspecified chronic kidney disease: Secondary | ICD-10-CM | POA: Diagnosis not present

## 2019-12-27 DIAGNOSIS — D649 Anemia, unspecified: Secondary | ICD-10-CM | POA: Diagnosis not present

## 2019-12-27 DIAGNOSIS — T451X5A Adverse effect of antineoplastic and immunosuppressive drugs, initial encounter: Secondary | ICD-10-CM | POA: Diagnosis not present

## 2019-12-27 DIAGNOSIS — N183 Chronic kidney disease, stage 3 unspecified: Secondary | ICD-10-CM | POA: Diagnosis not present

## 2019-12-27 DIAGNOSIS — Z5112 Encounter for antineoplastic immunotherapy: Secondary | ICD-10-CM | POA: Diagnosis not present

## 2019-12-27 DIAGNOSIS — G620 Drug-induced polyneuropathy: Secondary | ICD-10-CM | POA: Diagnosis not present

## 2019-12-27 LAB — CBC WITH DIFFERENTIAL/PLATELET
Abs Immature Granulocytes: 0.01 10*3/uL (ref 0.00–0.07)
Basophils Absolute: 0 10*3/uL (ref 0.0–0.1)
Basophils Relative: 1 %
Eosinophils Absolute: 0.2 10*3/uL (ref 0.0–0.5)
Eosinophils Relative: 6 %
HCT: 30.8 % — ABNORMAL LOW (ref 36.0–46.0)
Hemoglobin: 10.7 g/dL — ABNORMAL LOW (ref 12.0–15.0)
Immature Granulocytes: 0 %
Lymphocytes Relative: 15 %
Lymphs Abs: 0.5 10*3/uL — ABNORMAL LOW (ref 0.7–4.0)
MCH: 32.2 pg (ref 26.0–34.0)
MCHC: 34.7 g/dL (ref 30.0–36.0)
MCV: 92.8 fL (ref 80.0–100.0)
Monocytes Absolute: 0.4 10*3/uL (ref 0.1–1.0)
Monocytes Relative: 12 %
Neutro Abs: 2.3 10*3/uL (ref 1.7–7.7)
Neutrophils Relative %: 66 %
Platelets: 113 10*3/uL — ABNORMAL LOW (ref 150–400)
RBC: 3.32 MIL/uL — ABNORMAL LOW (ref 3.87–5.11)
RDW: 12.9 % (ref 11.5–15.5)
WBC: 3.4 10*3/uL — ABNORMAL LOW (ref 4.0–10.5)
nRBC: 0 % (ref 0.0–0.2)

## 2019-12-27 LAB — MAGNESIUM: Magnesium: 1 mg/dL — ABNORMAL LOW (ref 1.7–2.4)

## 2019-12-27 MED ORDER — HEPARIN SOD (PORK) LOCK FLUSH 100 UNIT/ML IV SOLN
500.0000 [IU] | Freq: Once | INTRAVENOUS | Status: AC
  Administered 2019-12-27: 500 [IU] via INTRAVENOUS
  Filled 2019-12-27: qty 5

## 2019-12-27 MED ORDER — SODIUM CHLORIDE 0.9 % IV SOLN
6.0000 g | Freq: Once | INTRAVENOUS | Status: AC
  Administered 2019-12-27: 6 g via INTRAVENOUS
  Filled 2019-12-27: qty 12

## 2019-12-27 MED ORDER — SODIUM CHLORIDE 0.9 % IV SOLN
INTRAVENOUS | Status: DC
  Filled 2019-12-27: qty 250

## 2019-12-27 MED ORDER — SODIUM CHLORIDE 0.9% FLUSH
10.0000 mL | INTRAVENOUS | Status: DC | PRN
  Administered 2019-12-27: 10 mL via INTRAVENOUS
  Filled 2019-12-27: qty 10

## 2019-12-27 NOTE — Patient Instructions (Signed)
Magnesium Sulfate injection What is this medicine? MAGNESIUM SULFATE (mag NEE zee um SUL fate) is an electrolyte injection commonly used to treat low magnesium levels in your blood. It is also used to prevent or control seizures in women with preeclampsia or eclampsia. This medicine may be used for other purposes; ask your health care provider or pharmacist if you have questions. What should I tell my health care provider before I take this medicine? They need to know if you have any of these conditions:  heart disease  history of irregular heart beat  kidney disease  an unusual or allergic reaction to magnesium sulfate, medicines, foods, dyes, or preservatives  pregnant or trying to get pregnant  breast-feeding How should I use this medicine? This medicine is for infusion into a vein. It is given by a health care professional in a hospital or clinic setting. Talk to your pediatrician regarding the use of this medicine in children. While this drug may be prescribed for selected conditions, precautions do apply. Overdosage: If you think you have taken too much of this medicine contact a poison control center or emergency room at once. NOTE: This medicine is only for you. Do not share this medicine with others. What if I miss a dose? This does not apply. What may interact with this medicine? This medicine may interact with the following medications:  certain medicines for anxiety or sleep  certain medicines for seizures like phenobarbital  digoxin  medicines that relax muscles for surgery  narcotic medicines for pain This list may not describe all possible interactions. Give your health care provider a list of all the medicines, herbs, non-prescription drugs, or dietary supplements you use. Also tell them if you smoke, drink alcohol, or use illegal drugs. Some items may interact with your medicine. What should I watch for while using this medicine? Your condition will be  monitored carefully while you are receiving this medicine. You may need blood work done while you are receiving this medicine. What side effects may I notice from receiving this medicine? Side effects that you should report to your doctor or health care professional as soon as possible:  allergic reactions like skin rash, itching or hives, swelling of the face, lips, or tongue  facial flushing  muscle weakness  signs and symptoms of low blood pressure like dizziness; feeling faint or lightheaded, falls; unusually weak or tired  signs and symptoms of a dangerous change in heartbeat or heart rhythm like chest pain; dizziness; fast or irregular heartbeat; palpitations; breathing problems  sweating This list may not describe all possible side effects. Call your doctor for medical advice about side effects. You may report side effects to FDA at 1-800-FDA-1088. Where should I keep my medicine? This drug is given in a hospital or clinic and will not be stored at home. NOTE: This sheet is a summary. It may not cover all possible information. If you have questions about this medicine, talk to your doctor, pharmacist, or health care provider.  2020 Elsevier/Gold Standard (2016-01-03 12:31:42)  

## 2019-12-29 ENCOUNTER — Encounter: Payer: Self-pay | Admitting: Hematology and Oncology

## 2019-12-29 NOTE — Progress Notes (Signed)
[facility]  [address] [place], [place] [address] Phone: [phone]  Fax: [phone]   Clinic Day:  12/30/2019  Referring physician: Glean Hess, MD  Chief Complaint: Sarah [name] is a 64 y.o. 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  HPI: The patient was last seen in the medical oncology clinic on 12/23/2019. At that time, she felt "ok".  Her appetite had improved.  Her neuropathy was stable.  Nausea was well controlled with Phenergan. Hematocrit was 30.5, hemoglobin 10.4, MCV 94.4, platelets 111,000, WBC 3,800 (ANC 2,600). Creatinine was 1.04. Magnesium was 1.5. She received 2 gm of IV magnesium.  She received daratumumab SQ.  She continued Pomalyst and Decadron.  Labs on 12/27/2019 revealed a hematocrit 30.8, hemoglobin 10.7, MCV 92.8, platelets 113,000, WBC 3,400 (ANC 2,300). Magnesium was 1.0. She received 6 gm IV magnesium.  During the interim, she has been "hanging in there." She is still feeling tired and weak.  She denies fevers, cough, shortness of breath, chest pain. Her nausea and tingling in her fingers and toes are about the same. She has had a little bit of diarrhea over the past week, but she is not worried because she was constipated the week before. She is currently on Pomalyst 3 mg and is taking her last dose of this cycle today.   WBC is 2800 today.  The patient is comfortable with deferring today's treatment.  She meets with the [facility] transplant team tomorrow.   Past Medical History:  Diagnosis Date  . Abnormal stress test 02/14/2016   Overview:  Added automatically from request for surgery [id number]  . Anemia   . Anxiety   . Arthritis   . Bicuspid aortic valve   . CHF (congestive heart failure) ([place])   . CKD (chronic kidney disease) stage 3, GFR 30-59 ml/min   . Depression   . Diabetes mellitus ([place])     . Dizziness   . Fatty liver   . Frequent falls   . GERD (gastroesophageal reflux disease)   . Gout   . Heart murmur   . History of blood transfusion   . History of bone marrow transplant ([place])   . History of uterine fibroid   . Hx of cardiac catheterization 06/05/2016   Overview:  Normal coronaries 2017  . Hypertension   . Hypomagnesemia   . Multiple myeloma ([place])   . Personal history of chemotherapy   . Renal cyst     Past Surgical History:  Procedure Laterality Date  . ABDOMINAL HYSTERECTOMY    . 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 CARDIAC ELECTROPHYSIOLOGY MAPPING AND ABLATION    . CARPAL TUNNEL RELEASE Bilateral   . CHOLECYSTECTOMY  2008  . COLONOSCOPY WITH PROPOFOL N/A 05/07/2017   Procedure: COLONOSCOPY WITH PROPOFOL;  Surgeon: [name], MD;  Location: [facility] ENDOSCOPY;  Service: Gastroenterology;  Laterality: N/A;  . ESOPHAGOGASTRODUODENOSCOPY (EGD) WITH PROPOFOL N/A 05/07/2017   Procedure: ESOPHAGOGASTRODUODENOSCOPY (EGD) WITH PROPOFOL;  Surgeon: [name], MD;  Location: [facility] ENDOSCOPY;  Service: Gastroenterology;  Laterality: N/A;  . FOOT SURGERY Bilateral   . INCONTINENCE SURGERY  2009  . INTERSTIM IMPLANT PLACEMENT    . other     over active bladder  . OTHER SURGICAL HISTORY     bladder stimulator   . PARTIAL HYSTERECTOMY  03/1996   fibroids  . PORTA CATH INSERTION N/A  03/10/2019   Procedure: PORTA CATH INSERTION;  Surgeon: Algernon Huxley, MD;  Location: [place] [facility] LAB;  Service: Cardiovascular;  Laterality: N/A;  . TONSILLECTOMY  2007    Family History  Problem Relation Age of Onset  . Colon cancer Father   . Renal Disease Father   . Diabetes Mellitus II Father   . Melanoma Paternal Grandmother   . Breast cancer Maternal Aunt 63  . Anemia Mother   . Heart disease Mother   . Heart failure Mother   . Renal Disease Mother   . Congestive Heart Failure Mother   . Heart disease Maternal Uncle   . Throat cancer Maternal Uncle   . Lung cancer  Maternal Uncle   . Liver disease Maternal Uncle   . Heart failure Maternal Uncle   . Hearing loss Son 50       Suicide     Social History:  reports that she quit smoking about 28 years ago. Her smoking use included cigarettes. She has a 20.00 pack-year smoking history. She has never used smokeless tobacco. She reports previous alcohol use. She reports that she does not use drugs. Patient has not had ETOH in several months. She is on disability. She notes exposure to perchloroethylene [facility]).She lives much of her adult life in [place]. She was married with 2 sons. Her husband passed away. Her 2 sons took their own lives (age 53 and 68). She worked in [employer] in [employer]. She went back to school and earned an [employer] and works for [employer] for several years.She [place] sistersin [place]. The patient is alone today.  Allergies:  Allergies  Allergen Reactions  . Oxycodone-Acetaminophen Anaphylaxis    Swelling and rash  . Celebrex [Celecoxib] Diarrhea  . Codeine   . Benadryl [Diphenhydramine] Palpitations  . Morphine Itching and Rash  . Ondansetron Diarrhea  . Tylenol [Acetaminophen] Itching and Rash    Current Medications: Current Outpatient Medications  Medication Sig Dispense Refill  . acetaminophen (TYLENOL) 500 MG tablet Take 500 mg by mouth every 6 (six) hours as needed.    Marland Kitchen acyclovir (ZOVIRAX) 400 MG tablet Take 1 tablet (400 mg total) by mouth 2 (two) times daily. 60 tablet 11  . allopurinol (ZYLOPRIM) 100 MG tablet TAKE 1 TABLET(100 MG) BY MOUTH DAILY as needed 90 tablet 1  . aspirin 81 MG chewable tablet Chew 1 tablet (81 mg total) by mouth daily. 30 tablet 0  . dexamethasone (DECADRON) 4 MG tablet Take 5 tablets (18m) on days 2, 9, 16, 23 of cycle 1 and 2 Take 5 tablets (263m on days 2, 16 of cycle 3,4,5 and 6 Take 5 tablets (2017mon days 2 of cycles 7 and beyond 120 tablet 0  . diclofenac sodium (VOLTAREN) 1 % GEL Apply 2 g  topically 4 (four) times daily. 100 g 1  . diphenhydrAMINE (BENADRYL) 50 MG capsule Take 50 mg by mouth as needed.     . diphenoxylate-atropine (LOMOTIL) 2.5-0.025 MG tablet Take 2 tablets by mouth daily as needed for diarrhea or loose stools. 45 tablet 2  . fentaNYL (DURAGESIC - DOSED MCG/HR) 25 MCG/HR patch Place 25 mcg onto the skin every 3 (three) days.     . fexofenadine (ALLEGRA) 180 MG tablet TAKE 1 TABLET(180 MG) BY MOUTH DAILY 30 tablet 5  . FLUoxetine (PROZAC) 40 MG capsule TAKE 1 CAPSULE EVERY DAY 90 capsule 1  . fluticasone (FLONASE) 50 MCG/ACT nasal spray Place 2 sprays into both  nostrils daily. 16 g 2  . furosemide (LASIX) 20 MG tablet Take 20 mg by mouth as needed.     Marland Kitchen glucose blood (ONE TOUCH ULTRA TEST) test strip     . hydrOXYzine (ATARAX/VISTARIL) 10 MG tablet TAKE 1 TABLET(10 MG) BY MOUTH THREE TIMES DAILY AS NEEDED 90 tablet 0  . lactulose (CHRONULAC) 10 GM/15ML solution     . lidocaine-prilocaine (EMLA) cream APPLY TOPICALLY AS NEEDED. 30 g 0  . lisinopril (ZESTRIL) 10 MG tablet Take 10 mg by mouth once.    . metFORMIN (GLUCOPHAGE-XR) 500 MG 24 hr tablet Take 1 tablet (500 mg total) by mouth daily with breakfast. 90 tablet 1  . Misc Natural Products (OSTEO BI-FLEX ADV TRIPLE ST PO) Take 2 tablets by mouth daily.    . montelukast (SINGULAIR) 10 MG tablet Take 1 tablet by mouth on the day before treatment and for 2 days after treatment. 30 tablet 0  . Multiple Vitamins-Minerals (HAIR/SKIN/NAILS/BIOTIN PO) Take 1 capsule by mouth 3 (three) times daily.    Marland Kitchen NARCAN 4 MG/0.1ML LIQD nasal spray kit 1 spray.     . Omega 3-6-9 Fatty Acids (OMEGA 3-6-9 COMPLEX) CAPS Take by mouth.    Marland Kitchen omeprazole (PRILOSEC) 40 MG capsule TAKE 1 CAPSULE EVERY DAY 90 capsule 3  . POMALYST 3 MG capsule TAKE 1 CAPSULE BY MOUTH EVERY DAY 21 capsule 0  . promethazine (PHENERGAN) 25 MG tablet Take 1 tablet (25 mg total) by mouth every 6 (six) hours as needed for nausea or vomiting. 90 tablet 0  .  promethazine-dextromethorphan (PROMETHAZINE-DM) 6.25-15 MG/5ML syrup Take 5 mLs by mouth 4 (four) times daily as needed for cough. 240 mL 0  . tiZANidine (ZANAFLEX) 4 MG tablet Take 1 tablet (4 mg total) by mouth every 8 (eight) hours as needed for muscle spasms. 270 tablet 1  . traZODone (DESYREL) 100 MG tablet TAKE 1 TABLET AT BEDTIME  FOR  SLEEP 90 tablet 1  . fluconazole (DIFLUCAN) 150 MG tablet Take 1 tablet (150 mg total) by mouth daily. (Patient not taking: Reported on 12/29/2019) 1 tablet 0   No current facility-administered medications for this visit.    Review of Systems  Constitutional: Positive for malaise/fatigue and weight loss (5 lbs). Negative for chills, diaphoresis and fever.       She is "hanging in there."  HENT: Negative for congestion, ear discharge, ear pain, hearing loss, nosebleeds, sinus pain, sore throat and tinnitus.   Eyes: Negative.  Negative for blurred vision.  Respiratory: Negative for cough, hemoptysis, sputum production, shortness of breath and wheezing.   Cardiovascular: Negative.  Negative for chest pain, palpitations and leg swelling.  Gastrointestinal: Positive for constipation (at times), diarrhea (occasional) and nausea (chronic s/p gallbladder surgery, stable with medication). Negative for abdominal pain, blood in stool, heartburn, melena and vomiting.  Genitourinary: Negative for dysuria, frequency, hematuria and urgency.  Musculoskeletal: Negative for back pain (chronic; paraspinal tenderness; upper back), falls, joint pain, myalgias and neck pain.  Skin: Negative for itching and rash.  Neurological: Positive for sensory change (chronic numbness in fingers and toes, stable) and weakness. Negative for dizziness, tingling and headaches.  Endo/Heme/Allergies: Negative.  Negative for environmental allergies. Does not bruise/bleed easily.       Diabetes, under good control.  Psychiatric/Behavioral: Negative.  Negative for depression and memory loss. The  patient is not nervous/anxious and does not have insomnia.   All other systems reviewed and are negative.   Performance status (ECOG): 1  Vitals  Blood pressure 94/62, pulse 72, temperature 98.4 F (36.9 C), resp. rate 20, weight 142 lb 3.2 oz (64.5 kg).   Physical Exam Vitals and nursing note reviewed.  Constitutional:      General: She is not in acute distress.    Appearance: She is well-developed. She is not diaphoretic.     Interventions: Face mask in place.  HENT:     Head: Normocephalic and atraumatic.     Mouth/Throat:     Mouth: Mucous membranes are moist.     Pharynx: Oropharynx is clear.  Eyes:     General: No scleral icterus.    Conjunctiva/sclera: Conjunctivae normal.     Pupils: Pupils are equal, round, and reactive to light.     Comments: Glasses. Brown eyes.   Cardiovascular:     Rate and Rhythm: Normal rate and regular rhythm.     Pulses: Normal pulses.     Heart sounds: Normal heart sounds. No murmur heard.   Pulmonary:     Effort: Pulmonary effort is normal. No respiratory distress.     Breath sounds: Normal breath sounds. No wheezing or rales.  Chest:     Chest wall: No tenderness.  Abdominal:     General: Bowel sounds are normal. There is no distension.     Palpations: Abdomen is soft. There is no mass.     Tenderness: There is no abdominal tenderness. There is no guarding or rebound.  Musculoskeletal:        General: No swelling or tenderness. Normal range of motion.     Cervical back: Normal range of motion and neck supple.  Lymphadenopathy:     Head:     Right side of head: No preauricular, posterior auricular or occipital adenopathy.     Left side of head: No preauricular, posterior auricular or occipital adenopathy.     Cervical: No cervical adenopathy.     Upper Body:     Right upper body: No supraclavicular or axillary adenopathy.     Left upper body: No supraclavicular or axillary adenopathy.     Lower Body: No right inguinal adenopathy. No  left inguinal adenopathy.  Skin:    General: Skin is warm and dry.  Neurological:     Mental Status: She is alert and oriented to person, place, and time. Mental status is at baseline.  Psychiatric:        Mood and Affect: Mood normal.        Behavior: Behavior normal.        Thought Content: Thought content normal.        Judgment: Judgment normal.    Infusion on 12/30/2019  Component Date Value Ref Range Status  . Sodium 12/30/2019 137  135 - 145 mmol/L Final  . Potassium 12/30/2019 4.3  3.5 - 5.1 mmol/L Final  . Chloride 12/30/2019 103  98 - 111 mmol/L Final  . CO2 12/30/2019 24  22 - 32 mmol/L Final  . Glucose, Bld 12/30/2019 153* 70 - 99 mg/dL Final   Glucose reference range applies only to samples taken after fasting for at least 8 hours.  . BUN 12/30/2019 20  8 - 23 mg/dL Final  . Creatinine, Ser 12/30/2019 1.16* 0.44 - 1.00 mg/dL Final  . Calcium 12/30/2019 9.2  8.9 - 10.3 mg/dL Final  . Total Protein 12/30/2019 6.7  6.5 - 8.1 g/dL Final  . Albumin 12/30/2019 4.1  3.5 - 5.0 g/dL Final  . AST 12/30/2019 18  15 - 41 U/L Final  .  ALT 12/30/2019 24  0 - 44 U/L Final  . Alkaline Phosphatase 12/30/2019 80  38 - 126 U/L Final  . Total Bilirubin 12/30/2019 0.6  0.3 - 1.2 mg/dL Final  . GFR calc non Af Amer 12/30/2019 50* >60 mL/min Final  . GFR calc Af Amer 12/30/2019 58* >60 mL/min Final  . Anion gap 12/30/2019 10  5 - 15 Final   Performed at [facility], [address] [place], [place] [address]  . WBC 12/30/2019 2.8* 4.0 - 10.5 K/uL Final  . RBC 12/30/2019 3.35* 3.87 - 5.11 MIL/uL Final  . Hemoglobin 12/30/2019 10.7* 12.0 - 15.0 g/dL Final  . HCT 12/30/2019 30.7* 36 - 46 % Final  . MCV 12/30/2019 91.6  80.0 - 100.0 fL Final  . MCH 12/30/2019 31.9  26.0 - 34.0 pg Final  . MCHC 12/30/2019 34.9  30.0 - 36.0 g/dL Final  . RDW 12/30/2019 12.8  11.5 - 15.5 % Final  . Platelets 12/30/2019 112* 150 - 400 K/uL Final   Comment: Immature Platelet Fraction may  be clinically indicated, consider ordering this additional test [id number]   . nRBC 12/30/2019 0.0  0.0 - 0.2 % Final  . Neutrophils Relative % 12/30/2019 64  % Final  . Neutro Abs 12/30/2019 1.8  1.7 - 7.7 K/uL Final  . Lymphocytes Relative 12/30/2019 14  % Final  . Lymphs Abs 12/30/2019 0.4* 0.7 - 4.0 K/uL Final  . Monocytes Relative 12/30/2019 15  % Final  . Monocytes Absolute 12/30/2019 0.4  0 - 1 K/uL Final  . Eosinophils Relative 12/30/2019 6  % Final  . Eosinophils Absolute 12/30/2019 0.2  0 - 0 K/uL Final  . Basophils Relative 12/30/2019 1  % Final  . Basophils Absolute 12/30/2019 0.0  0 - 0 K/uL Final  . Immature Granulocytes 12/30/2019 0  % Final  . Abs Immature Granulocytes 12/30/2019 0.01  0.00 - 0.07 K/uL Final   Performed at [facility], [address], [place], [place] [address]  . Magnesium 12/30/2019 1.3* 1.7 - 2.4 mg/dL Final   Performed at [facility], [address], [place], [place] [address]    Assessment:  Clarivel Callaway is a 64 y.o. 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 Bone marrow revealed 80% plasma cells. Lambda free light chains were 1340. She had nephrotic range proteinuria. She initially underwent induction with RVD. Revlimid maintenance was discontinued on 01/21/2017 secondary to intolerance.   Bone marrowaspirate andbiopsyon 01/18/2021revealed anormocellularmarrow withbut increased lambda-restricted plasma cells (9% aspirate, 40% CD138 immunohistochemistry).Findingswereconsistent with recurrent plasma cell myeloma.Flow cytometry revealed no monoclonal B-cell or phenotypically aberrant T-cell population. Cytogeneticswere 59, XX (normal). 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  M-spikehas been followed: 0 on 04/02/2016 -06/23/2019; 0.2 on 09/02/2017 and 0.1 on 11/25/2019.  Lambda light  chainshave been followed: 22.2 (ratio 0.56) on 07/03/2017, 30.8 (ratio 0.78) on 09/02/2017, 36.9 (ratio 0.40) on 10/21/2017, 37.4 (ratio 0.41) on 12/16/2017, 70.7(ratio 0.31) on 02/17/2018, 64.2 (ratio 0.27) on 04/07/2018, 78.9 (ratio 0.18) on 05/26/2018, 128.8 (ratio 0.17) on 08/06/2018, 181.5 (ratio 0.13) on 10/08/2018, 130.9 (ratio 0.13) on 10/20/2018, 160.7 (ratio 0.10)on 12/09/2018, 236.6 (ratio 0.07) on 02/01/2019, 363.6 (ratio 0.04) on 03/22/2019, 404.8 (ratio 0.04) on 04/05/2019, 420.7 (ratio 0.03) on 05/24/2019, 573.4 (ratio 0.03) on 06/23/2019, 451.05(ratio 0.02) on02/19/2021, 47.2 (ratio 0.13) on 10/25/2019, and 22.4 (ratio 0.21) on 11/25/2019.  24 hour UPEPon  06/03/2019 revealed kappa free light chains95.76,lambda free light chains1,260.71, andratio 0.08.24 hourUPEPon 02/22/2021revealed totalproteinof733m/24 hrs withlambdafree light chains 1,084.168mL andratioof0.10(1.03-31.76). M spike inurinewas46.1%(36110m4 hrs).  Bone surveyon 04/08/2016 and11/28/2018 revealed no definite lytic lesion seen in the visualized skeleton.Bone surveyon 11/19/2018 revealed no suspicious lucent lesionsand no acute bony abnormality.PET scanon 07/12/2019 revealed no focal metabolic activity to suggest active myeloma within the skeleton. There wereno lytic lesions identified on the CT portion of the examorsoft tissue plasmacytomas. There was no evidence of multiple myeloma.  Pretreatment RBC phenotypeon 09/23/2019 waspositivefor C, e, DUFFY B, KIDD B, M, S, and s antigen; negativefor c, E, KELL, DUFFY A, KIDD A, and N antigen.  Sheis day 22 ofcycle #3 daratumumab and hyaluronidase-fihj, Pomalyst, and Decadron (DPd)(09/27/2019 - 10/25/2019; 12/09/2019). Cycle #1 was complicated by fever and neutropenia requiring admission.  Cycle #2 was complicated by pneumonia requiring admission.  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 Zometa  was discontinued in 01/2017. She has chronic nauseaon Phenergan.  She has B12 deficiency. B12 was 254 on 04/09/2017, 295 on 08/20/2018, and 391 on 10/08/2018. Shewasonoral B12.She received B12 monthly (last06/17/2021).Folate was18.6on 02/08/2019.  She has iron deficiency. Ferritin was 32 on 07/01/2019. She received Venoferon 07/15/2019 and 07/22/2019.  [facility] for fever and neutropenia. Cultures were negative. CXR was negative. She received broad spectrum antibiotics and daily Granix. She received IVF for acute renal insufficiency due to diarrhea and dehydration. Creatine was 1.66 on admission and 1.12 on discharge.  She received her second COVID-19vaccineon 10/09/2019.  Symptomatically, she feels "weak and tired".  Neuropathy is stable.  She has intermittent diarrhea and constipation.  Exam is stable.  WBC is 2800.  Plan: 1.   Labs today:  CBC with diff, CMP, Mg. 2. Stage III multiple myeloma Clinically, she is doing well Lambda free light chains have improved from451.05on02/19/2021.   After cycle #1:  47.2.   After cycle #2:  22.4. She is day22of cycle #3 daratumumab plus Pomalyst and dexamethasone (DPd). She began cycle #3 on 12/09/2019   Neuropathy is stable.  Bowels are unrelated to treatment.                         Counts have decreased.             Reviewed labs.  Discuss risk versus benefit of receiving final dose of daratumumab SQ with this cycle.   Review prior treatment cycles.  She has never received day 1-2 daratumumab because of infection.   With upcoming holiday [facility], discussed holding daratumumab today.              No daratumumab today.  Complete this cycle of Pomalyst and Decadron.             Patient advised to contact clinic if any  concerns regarding fever, chills or not doing well.             Treatment plan:                          Pomalidomide (Pomalyst) 3 mg po q day 1-21.                         Decadron 20 mg po day 1-2, day 8-9, 15-16, and 22-23 with cycle #1 and 2. Decadron 20 mg  po on days 1-2 and 15-16 x 4 cycles (if needed). Daratumumab and hyaluronidase-fihj (Darzalez Faspro) 1800 mg SQ on days 1, 8, 15, and 22. Premeds for [employer]:  Singulair 10 mg po day before, day of, and 2 days. Ondansetron 8 mg po,Benadryl 50 mg po, andTylenol 650 mg po.             Patient to be seen at the [facility] bone marrow transplant program tomorrow.             Discuss symptom management.  She has antiemetics and pain medications at home to use on a as needed basis.  Interventions are adequate.   3. Normocytic anemia Hematocrit30.7. Hemoglobin10.7. MCV91.6.Platelets112,000today. Ferritin128 on 10/04/2019. TSH was normal on02/20/2020  Creatinine 1.10(CrCl54.42m/minute). Hemoglobin is stable as compared to prior cycles likely due to overall health of bone marrow. 4. Grade II peripheral neuropathy Etiology secondary to prior chemotherapy. Neuropathy is stable. Continue to monitor. 5.B12 deficiency Patient receives B12 monthly (last 12/16/2019). Folate was18.6on 02/08/2019. Monitor folate annually. 6. Hypomagnesemia, chronic Magnesium1.3 today. Magnesium 4 g IV today. Continue IV magnesium supplement twice weekly.    She will be seen on 07/06 rather than 07/05 secondary to upcoming holiday.  7.Nausea Chronic nausea is well managed  with Phenergan. 8.   RTC on 07/06 and 07/08 for labs (CBC with diff, Mg) and +/- IV Mg. 9.   RTC on 0712/2021 for MD assessment, labs (CB with diff, CMP, Mg, folate, SPEP, FLCA), and day 1 of cycle #4 daratumumab.  I discussed the assessment and treatment plan with the patient.  The patient was provided an opportunity to ask questions and all were answered.  The patient agreed with the plan and demonstrated an understanding of the instructions.  The patient was advised to call back if the symptoms worsen or if the condition fails to improve as anticipated.   MLequita Asal MD, PhD    12/30/2019, 10:21 AM  I, EDe Burrs am acting as sEducation administratorfor [employer]Cal[employer] CMike Gip MD, PhD.  I, Autry Droege C. CMike Gip MD, have reviewed the above documentation for accuracy and completeness, and I agree with the above.

## 2019-12-30 ENCOUNTER — Inpatient Hospital Stay (HOSPITAL_BASED_OUTPATIENT_CLINIC_OR_DEPARTMENT_OTHER): Payer: Medicare PPO | Admitting: Hematology and Oncology

## 2019-12-30 ENCOUNTER — Inpatient Hospital Stay: Payer: Medicare PPO | Attending: Hematology and Oncology

## 2019-12-30 ENCOUNTER — Inpatient Hospital Stay: Payer: Medicare PPO

## 2019-12-30 ENCOUNTER — Other Ambulatory Visit: Payer: Self-pay

## 2019-12-30 ENCOUNTER — Encounter: Payer: Self-pay | Admitting: Hematology and Oncology

## 2019-12-30 VITALS — BP 94/62 | HR 72 | Temp 98.4°F | Resp 20 | Wt 142.2 lb

## 2019-12-30 DIAGNOSIS — R634 Abnormal weight loss: Secondary | ICD-10-CM | POA: Insufficient documentation

## 2019-12-30 DIAGNOSIS — Z800 Family history of malignant neoplasm of digestive organs: Secondary | ICD-10-CM | POA: Insufficient documentation

## 2019-12-30 DIAGNOSIS — R112 Nausea with vomiting, unspecified: Secondary | ICD-10-CM | POA: Insufficient documentation

## 2019-12-30 DIAGNOSIS — Z801 Family history of malignant neoplasm of trachea, bronchus and lung: Secondary | ICD-10-CM | POA: Insufficient documentation

## 2019-12-30 DIAGNOSIS — E1122 Type 2 diabetes mellitus with diabetic chronic kidney disease: Secondary | ICD-10-CM | POA: Insufficient documentation

## 2019-12-30 DIAGNOSIS — Z822 Family history of deafness and hearing loss: Secondary | ICD-10-CM | POA: Insufficient documentation

## 2019-12-30 DIAGNOSIS — Z87891 Personal history of nicotine dependence: Secondary | ICD-10-CM | POA: Diagnosis not present

## 2019-12-30 DIAGNOSIS — Z832 Family history of diseases of the blood and blood-forming organs and certain disorders involving the immune mechanism: Secondary | ICD-10-CM | POA: Insufficient documentation

## 2019-12-30 DIAGNOSIS — Z8249 Family history of ischemic heart disease and other diseases of the circulatory system: Secondary | ICD-10-CM | POA: Insufficient documentation

## 2019-12-30 DIAGNOSIS — Z8379 Family history of other diseases of the digestive system: Secondary | ICD-10-CM | POA: Insufficient documentation

## 2019-12-30 DIAGNOSIS — Z79899 Other long term (current) drug therapy: Secondary | ICD-10-CM | POA: Diagnosis not present

## 2019-12-30 DIAGNOSIS — I129 Hypertensive chronic kidney disease with stage 1 through stage 4 chronic kidney disease, or unspecified chronic kidney disease: Secondary | ICD-10-CM | POA: Diagnosis not present

## 2019-12-30 DIAGNOSIS — R197 Diarrhea, unspecified: Secondary | ICD-10-CM | POA: Diagnosis not present

## 2019-12-30 DIAGNOSIS — Z9484 Stem cells transplant status: Secondary | ICD-10-CM | POA: Diagnosis not present

## 2019-12-30 DIAGNOSIS — C9000 Multiple myeloma not having achieved remission: Secondary | ICD-10-CM

## 2019-12-30 DIAGNOSIS — I959 Hypotension, unspecified: Secondary | ICD-10-CM | POA: Diagnosis not present

## 2019-12-30 DIAGNOSIS — Z9481 Bone marrow transplant status: Secondary | ICD-10-CM | POA: Diagnosis not present

## 2019-12-30 DIAGNOSIS — E538 Deficiency of other specified B group vitamins: Secondary | ICD-10-CM

## 2019-12-30 DIAGNOSIS — M1990 Unspecified osteoarthritis, unspecified site: Secondary | ICD-10-CM | POA: Insufficient documentation

## 2019-12-30 DIAGNOSIS — Z886 Allergy status to analgesic agent status: Secondary | ICD-10-CM | POA: Insufficient documentation

## 2019-12-30 DIAGNOSIS — R110 Nausea: Secondary | ICD-10-CM | POA: Insufficient documentation

## 2019-12-30 DIAGNOSIS — K5900 Constipation, unspecified: Secondary | ICD-10-CM | POA: Diagnosis not present

## 2019-12-30 DIAGNOSIS — N183 Chronic kidney disease, stage 3 unspecified: Secondary | ICD-10-CM | POA: Insufficient documentation

## 2019-12-30 DIAGNOSIS — D801 Nonfamilial hypogammaglobulinemia: Secondary | ICD-10-CM | POA: Diagnosis not present

## 2019-12-30 DIAGNOSIS — Z7952 Long term (current) use of systemic steroids: Secondary | ICD-10-CM | POA: Diagnosis not present

## 2019-12-30 DIAGNOSIS — Z885 Allergy status to narcotic agent status: Secondary | ICD-10-CM | POA: Insufficient documentation

## 2019-12-30 DIAGNOSIS — Z833 Family history of diabetes mellitus: Secondary | ICD-10-CM | POA: Diagnosis not present

## 2019-12-30 DIAGNOSIS — Z808 Family history of malignant neoplasm of other organs or systems: Secondary | ICD-10-CM | POA: Insufficient documentation

## 2019-12-30 DIAGNOSIS — E860 Dehydration: Secondary | ICD-10-CM | POA: Diagnosis not present

## 2019-12-30 DIAGNOSIS — D649 Anemia, unspecified: Secondary | ICD-10-CM

## 2019-12-30 DIAGNOSIS — D709 Neutropenia, unspecified: Secondary | ICD-10-CM | POA: Insufficient documentation

## 2019-12-30 DIAGNOSIS — Z818 Family history of other mental and behavioral disorders: Secondary | ICD-10-CM | POA: Insufficient documentation

## 2019-12-30 DIAGNOSIS — G620 Drug-induced polyneuropathy: Secondary | ICD-10-CM

## 2019-12-30 DIAGNOSIS — Z803 Family history of malignant neoplasm of breast: Secondary | ICD-10-CM | POA: Insufficient documentation

## 2019-12-30 DIAGNOSIS — T451X5A Adverse effect of antineoplastic and immunosuppressive drugs, initial encounter: Secondary | ICD-10-CM | POA: Diagnosis not present

## 2019-12-30 LAB — CBC WITH DIFFERENTIAL/PLATELET
Abs Immature Granulocytes: 0.01 10*3/uL (ref 0.00–0.07)
Basophils Absolute: 0 10*3/uL (ref 0.0–0.1)
Basophils Relative: 1 %
Eosinophils Absolute: 0.2 10*3/uL (ref 0.0–0.5)
Eosinophils Relative: 6 %
HCT: 30.7 % — ABNORMAL LOW (ref 36.0–46.0)
Hemoglobin: 10.7 g/dL — ABNORMAL LOW (ref 12.0–15.0)
Immature Granulocytes: 0 %
Lymphocytes Relative: 14 %
Lymphs Abs: 0.4 10*3/uL — ABNORMAL LOW (ref 0.7–4.0)
MCH: 31.9 pg (ref 26.0–34.0)
MCHC: 34.9 g/dL (ref 30.0–36.0)
MCV: 91.6 fL (ref 80.0–100.0)
Monocytes Absolute: 0.4 10*3/uL (ref 0.1–1.0)
Monocytes Relative: 15 %
Neutro Abs: 1.8 10*3/uL (ref 1.7–7.7)
Neutrophils Relative %: 64 %
Platelets: 112 10*3/uL — ABNORMAL LOW (ref 150–400)
RBC: 3.35 MIL/uL — ABNORMAL LOW (ref 3.87–5.11)
RDW: 12.8 % (ref 11.5–15.5)
WBC: 2.8 10*3/uL — ABNORMAL LOW (ref 4.0–10.5)
nRBC: 0 % (ref 0.0–0.2)

## 2019-12-30 LAB — COMPREHENSIVE METABOLIC PANEL
ALT: 24 U/L (ref 0–44)
AST: 18 U/L (ref 15–41)
Albumin: 4.1 g/dL (ref 3.5–5.0)
Alkaline Phosphatase: 80 U/L (ref 38–126)
Anion gap: 10 (ref 5–15)
BUN: 20 mg/dL (ref 8–23)
CO2: 24 mmol/L (ref 22–32)
Calcium: 9.2 mg/dL (ref 8.9–10.3)
Chloride: 103 mmol/L (ref 98–111)
Creatinine, Ser: 1.16 mg/dL — ABNORMAL HIGH (ref 0.44–1.00)
GFR calc Af Amer: 58 mL/min — ABNORMAL LOW (ref >60)
GFR calc non Af Amer: 50 mL/min — ABNORMAL LOW (ref >60)
Glucose, Bld: 153 mg/dL — ABNORMAL HIGH (ref 70–99)
Potassium: 4.3 mmol/L (ref 3.5–5.1)
Sodium: 137 mmol/L (ref 135–145)
Total Bilirubin: 0.6 mg/dL (ref 0.3–1.2)
Total Protein: 6.7 g/dL (ref 6.5–8.1)

## 2019-12-30 LAB — MAGNESIUM: Magnesium: 1.3 mg/dL — ABNORMAL LOW (ref 1.7–2.4)

## 2019-12-30 MED ORDER — SODIUM CHLORIDE 0.9 % IV SOLN
INTRAVENOUS | Status: DC
  Filled 2019-12-30: qty 250

## 2019-12-30 MED ORDER — MAGNESIUM SULFATE 4 GM/100ML IV SOLN
4.0000 g | Freq: Once | INTRAVENOUS | Status: AC
  Administered 2019-12-30: 4 g via INTRAVENOUS
  Filled 2019-12-30: qty 100

## 2019-12-30 MED ORDER — HEPARIN SOD (PORK) LOCK FLUSH 100 UNIT/ML IV SOLN
500.0000 [IU] | Freq: Once | INTRAVENOUS | Status: AC | PRN
  Administered 2019-12-30: 500 [IU]
  Filled 2019-12-30: qty 5

## 2019-12-31 ENCOUNTER — Other Ambulatory Visit: Payer: Self-pay | Admitting: *Deleted

## 2019-12-31 DIAGNOSIS — K089 Disorder of teeth and supporting structures, unspecified: Secondary | ICD-10-CM | POA: Diagnosis not present

## 2019-12-31 DIAGNOSIS — C9000 Multiple myeloma not having achieved remission: Secondary | ICD-10-CM | POA: Diagnosis not present

## 2019-12-31 DIAGNOSIS — D801 Nonfamilial hypogammaglobulinemia: Secondary | ICD-10-CM | POA: Diagnosis not present

## 2019-12-31 DIAGNOSIS — Z7952 Long term (current) use of systemic steroids: Secondary | ICD-10-CM | POA: Diagnosis not present

## 2019-12-31 DIAGNOSIS — R634 Abnormal weight loss: Secondary | ICD-10-CM | POA: Diagnosis not present

## 2019-12-31 DIAGNOSIS — R5383 Other fatigue: Secondary | ICD-10-CM | POA: Diagnosis not present

## 2019-12-31 DIAGNOSIS — G8929 Other chronic pain: Secondary | ICD-10-CM | POA: Diagnosis not present

## 2019-12-31 DIAGNOSIS — C9002 Multiple myeloma in relapse: Secondary | ICD-10-CM | POA: Diagnosis not present

## 2019-12-31 DIAGNOSIS — Z886 Allergy status to analgesic agent status: Secondary | ICD-10-CM | POA: Diagnosis not present

## 2019-12-31 DIAGNOSIS — Z7982 Long term (current) use of aspirin: Secondary | ICD-10-CM | POA: Diagnosis not present

## 2019-12-31 DIAGNOSIS — C9001 Multiple myeloma in remission: Secondary | ICD-10-CM | POA: Diagnosis not present

## 2019-12-31 DIAGNOSIS — Z885 Allergy status to narcotic agent status: Secondary | ICD-10-CM | POA: Diagnosis not present

## 2019-12-31 DIAGNOSIS — Z87891 Personal history of nicotine dependence: Secondary | ICD-10-CM | POA: Diagnosis not present

## 2019-12-31 DIAGNOSIS — Z7409 Other reduced mobility: Secondary | ICD-10-CM | POA: Diagnosis not present

## 2019-12-31 DIAGNOSIS — G629 Polyneuropathy, unspecified: Secondary | ICD-10-CM | POA: Diagnosis not present

## 2020-01-04 ENCOUNTER — Other Ambulatory Visit: Payer: Self-pay | Admitting: Hematology and Oncology

## 2020-01-04 ENCOUNTER — Inpatient Hospital Stay: Payer: Medicare PPO

## 2020-01-04 ENCOUNTER — Other Ambulatory Visit: Payer: Self-pay

## 2020-01-04 VITALS — BP 110/67 | HR 81 | Temp 97.0°F | Resp 18

## 2020-01-04 DIAGNOSIS — N183 Chronic kidney disease, stage 3 unspecified: Secondary | ICD-10-CM | POA: Diagnosis not present

## 2020-01-04 DIAGNOSIS — C9000 Multiple myeloma not having achieved remission: Secondary | ICD-10-CM | POA: Diagnosis not present

## 2020-01-04 DIAGNOSIS — E538 Deficiency of other specified B group vitamins: Secondary | ICD-10-CM | POA: Diagnosis not present

## 2020-01-04 DIAGNOSIS — D801 Nonfamilial hypogammaglobulinemia: Secondary | ICD-10-CM | POA: Diagnosis not present

## 2020-01-04 DIAGNOSIS — D709 Neutropenia, unspecified: Secondary | ICD-10-CM | POA: Diagnosis not present

## 2020-01-04 DIAGNOSIS — K5900 Constipation, unspecified: Secondary | ICD-10-CM | POA: Diagnosis not present

## 2020-01-04 DIAGNOSIS — R110 Nausea: Secondary | ICD-10-CM | POA: Diagnosis not present

## 2020-01-04 DIAGNOSIS — R197 Diarrhea, unspecified: Secondary | ICD-10-CM | POA: Diagnosis not present

## 2020-01-04 DIAGNOSIS — I129 Hypertensive chronic kidney disease with stage 1 through stage 4 chronic kidney disease, or unspecified chronic kidney disease: Secondary | ICD-10-CM | POA: Diagnosis not present

## 2020-01-04 LAB — CBC WITH DIFFERENTIAL/PLATELET
Abs Immature Granulocytes: 0.01 10*3/uL (ref 0.00–0.07)
Basophils Absolute: 0.1 10*3/uL (ref 0.0–0.1)
Basophils Relative: 1 %
Eosinophils Absolute: 0.1 10*3/uL (ref 0.0–0.5)
Eosinophils Relative: 2 %
HCT: 28.4 % — ABNORMAL LOW (ref 36.0–46.0)
Hemoglobin: 10 g/dL — ABNORMAL LOW (ref 12.0–15.0)
Immature Granulocytes: 0 %
Lymphocytes Relative: 15 %
Lymphs Abs: 0.5 10*3/uL — ABNORMAL LOW (ref 0.7–4.0)
MCH: 32.3 pg (ref 26.0–34.0)
MCHC: 35.2 g/dL (ref 30.0–36.0)
MCV: 91.6 fL (ref 80.0–100.0)
Monocytes Absolute: 0.5 10*3/uL (ref 0.1–1.0)
Monocytes Relative: 14 %
Neutro Abs: 2.4 10*3/uL (ref 1.7–7.7)
Neutrophils Relative %: 68 %
Platelets: 139 10*3/uL — ABNORMAL LOW (ref 150–400)
RBC: 3.1 MIL/uL — ABNORMAL LOW (ref 3.87–5.11)
RDW: 12.6 % (ref 11.5–15.5)
WBC: 3.6 10*3/uL — ABNORMAL LOW (ref 4.0–10.5)
nRBC: 0 % (ref 0.0–0.2)

## 2020-01-04 LAB — MAGNESIUM: Magnesium: 1.2 mg/dL — ABNORMAL LOW (ref 1.7–2.4)

## 2020-01-04 MED ORDER — SODIUM CHLORIDE 0.9% FLUSH
10.0000 mL | Freq: Once | INTRAVENOUS | Status: AC | PRN
  Administered 2020-01-04: 10 mL
  Filled 2020-01-04: qty 10

## 2020-01-04 MED ORDER — SODIUM CHLORIDE 0.9 % IV SOLN
6.0000 g | Freq: Once | INTRAVENOUS | Status: AC
  Administered 2020-01-04: 6 g via INTRAVENOUS
  Filled 2020-01-04: qty 12

## 2020-01-04 MED ORDER — HEPARIN SOD (PORK) LOCK FLUSH 100 UNIT/ML IV SOLN
500.0000 [IU] | Freq: Once | INTRAVENOUS | Status: AC | PRN
  Administered 2020-01-04: 500 [IU]
  Filled 2020-01-04: qty 5

## 2020-01-04 MED ORDER — SODIUM CHLORIDE 0.9 % IV SOLN
Freq: Once | INTRAVENOUS | Status: AC
  Filled 2020-01-04: qty 250

## 2020-01-06 ENCOUNTER — Inpatient Hospital Stay: Payer: Medicare PPO

## 2020-01-06 ENCOUNTER — Other Ambulatory Visit: Payer: Self-pay | Admitting: Hematology and Oncology

## 2020-01-06 ENCOUNTER — Other Ambulatory Visit: Payer: Self-pay

## 2020-01-06 DIAGNOSIS — K5900 Constipation, unspecified: Secondary | ICD-10-CM | POA: Diagnosis not present

## 2020-01-06 DIAGNOSIS — I129 Hypertensive chronic kidney disease with stage 1 through stage 4 chronic kidney disease, or unspecified chronic kidney disease: Secondary | ICD-10-CM | POA: Diagnosis not present

## 2020-01-06 DIAGNOSIS — R110 Nausea: Secondary | ICD-10-CM | POA: Diagnosis not present

## 2020-01-06 DIAGNOSIS — D709 Neutropenia, unspecified: Secondary | ICD-10-CM | POA: Diagnosis not present

## 2020-01-06 DIAGNOSIS — N183 Chronic kidney disease, stage 3 unspecified: Secondary | ICD-10-CM | POA: Diagnosis not present

## 2020-01-06 DIAGNOSIS — C9000 Multiple myeloma not having achieved remission: Secondary | ICD-10-CM

## 2020-01-06 DIAGNOSIS — E538 Deficiency of other specified B group vitamins: Secondary | ICD-10-CM | POA: Diagnosis not present

## 2020-01-06 DIAGNOSIS — R197 Diarrhea, unspecified: Secondary | ICD-10-CM | POA: Diagnosis not present

## 2020-01-06 DIAGNOSIS — D801 Nonfamilial hypogammaglobulinemia: Secondary | ICD-10-CM | POA: Diagnosis not present

## 2020-01-06 LAB — CBC WITH DIFFERENTIAL/PLATELET
Abs Immature Granulocytes: 0.01 10*3/uL (ref 0.00–0.07)
Basophils Absolute: 0.1 10*3/uL (ref 0.0–0.1)
Basophils Relative: 1 %
Eosinophils Absolute: 0.1 10*3/uL (ref 0.0–0.5)
Eosinophils Relative: 2 %
HCT: 27.8 % — ABNORMAL LOW (ref 36.0–46.0)
Hemoglobin: 9.9 g/dL — ABNORMAL LOW (ref 12.0–15.0)
Immature Granulocytes: 0 %
Lymphocytes Relative: 23 %
Lymphs Abs: 0.8 10*3/uL (ref 0.7–4.0)
MCH: 32.5 pg (ref 26.0–34.0)
MCHC: 35.6 g/dL (ref 30.0–36.0)
MCV: 91.1 fL (ref 80.0–100.0)
Monocytes Absolute: 0.5 10*3/uL (ref 0.1–1.0)
Monocytes Relative: 14 %
Neutro Abs: 2.2 10*3/uL (ref 1.7–7.7)
Neutrophils Relative %: 60 %
Platelets: 149 10*3/uL — ABNORMAL LOW (ref 150–400)
RBC: 3.05 MIL/uL — ABNORMAL LOW (ref 3.87–5.11)
RDW: 12.5 % (ref 11.5–15.5)
WBC: 3.6 10*3/uL — ABNORMAL LOW (ref 4.0–10.5)
nRBC: 0 % (ref 0.0–0.2)

## 2020-01-06 LAB — MAGNESIUM: Magnesium: 1.7 mg/dL (ref 1.7–2.4)

## 2020-01-06 MED ORDER — HEPARIN SOD (PORK) LOCK FLUSH 100 UNIT/ML IV SOLN
500.0000 [IU] | Freq: Once | INTRAVENOUS | Status: DC
  Filled 2020-01-06: qty 5

## 2020-01-06 MED ORDER — SODIUM CHLORIDE 0.9% FLUSH
10.0000 mL | INTRAVENOUS | Status: DC | PRN
  Administered 2020-01-06: 10 mL via INTRAVENOUS
  Filled 2020-01-06: qty 10

## 2020-01-06 NOTE — Progress Notes (Signed)
Patient has had a couple of bad "stomach days" vomiting, nausea and diarrhea. She thinks it is a result of eating bad sauerkraut. She woke up with a runny nose and thinks she might be starting a cold. Temp 99.2.  MD made aware of above. Magnesium level 1.7 today. Pt to come back tomorrow for magnesium level and possible IV magnesium.

## 2020-01-07 ENCOUNTER — Inpatient Hospital Stay: Payer: Medicare PPO

## 2020-01-07 VITALS — BP 142/71 | HR 73 | Temp 97.2°F | Resp 16

## 2020-01-07 DIAGNOSIS — E538 Deficiency of other specified B group vitamins: Secondary | ICD-10-CM | POA: Diagnosis not present

## 2020-01-07 DIAGNOSIS — D801 Nonfamilial hypogammaglobulinemia: Secondary | ICD-10-CM | POA: Diagnosis not present

## 2020-01-07 DIAGNOSIS — I129 Hypertensive chronic kidney disease with stage 1 through stage 4 chronic kidney disease, or unspecified chronic kidney disease: Secondary | ICD-10-CM | POA: Diagnosis not present

## 2020-01-07 DIAGNOSIS — R197 Diarrhea, unspecified: Secondary | ICD-10-CM | POA: Diagnosis not present

## 2020-01-07 DIAGNOSIS — C9000 Multiple myeloma not having achieved remission: Secondary | ICD-10-CM

## 2020-01-07 DIAGNOSIS — R110 Nausea: Secondary | ICD-10-CM | POA: Diagnosis not present

## 2020-01-07 DIAGNOSIS — D649 Anemia, unspecified: Secondary | ICD-10-CM

## 2020-01-07 DIAGNOSIS — D709 Neutropenia, unspecified: Secondary | ICD-10-CM | POA: Diagnosis not present

## 2020-01-07 DIAGNOSIS — N183 Chronic kidney disease, stage 3 unspecified: Secondary | ICD-10-CM | POA: Diagnosis not present

## 2020-01-07 DIAGNOSIS — K5900 Constipation, unspecified: Secondary | ICD-10-CM | POA: Diagnosis not present

## 2020-01-07 LAB — MAGNESIUM: Magnesium: 1.4 mg/dL — ABNORMAL LOW (ref 1.7–2.4)

## 2020-01-07 MED ORDER — MAGNESIUM SULFATE 4 GM/100ML IV SOLN
4.0000 g | Freq: Once | INTRAVENOUS | Status: AC
  Administered 2020-01-07: 4 g via INTRAVENOUS
  Filled 2020-01-07: qty 100

## 2020-01-07 MED ORDER — SODIUM CHLORIDE 0.9% FLUSH
10.0000 mL | INTRAVENOUS | Status: DC | PRN
  Administered 2020-01-07: 10 mL via INTRAVENOUS
  Filled 2020-01-07: qty 10

## 2020-01-07 MED ORDER — HEPARIN SOD (PORK) LOCK FLUSH 100 UNIT/ML IV SOLN
500.0000 [IU] | Freq: Once | INTRAVENOUS | Status: AC
  Administered 2020-01-07: 500 [IU] via INTRAVENOUS
  Filled 2020-01-07: qty 5

## 2020-01-07 MED ORDER — SODIUM CHLORIDE 0.9 % IV SOLN
Freq: Once | INTRAVENOUS | Status: AC
  Filled 2020-01-07: qty 250

## 2020-01-07 NOTE — Progress Notes (Signed)
Patient feels better today. No N/V or diarrhea. Runny nose slightly better. No sore throat or cough. Just feels fatigued. Would like MD to check her ferritin level, she thinks iron infusion might help her.

## 2020-01-10 ENCOUNTER — Inpatient Hospital Stay: Payer: Medicare PPO

## 2020-01-10 ENCOUNTER — Other Ambulatory Visit: Payer: Self-pay | Admitting: Hematology and Oncology

## 2020-01-10 ENCOUNTER — Other Ambulatory Visit: Payer: Self-pay

## 2020-01-10 ENCOUNTER — Inpatient Hospital Stay (HOSPITAL_BASED_OUTPATIENT_CLINIC_OR_DEPARTMENT_OTHER): Payer: Medicare PPO | Admitting: Nurse Practitioner

## 2020-01-10 ENCOUNTER — Encounter: Payer: Self-pay | Admitting: Nurse Practitioner

## 2020-01-10 VITALS — BP 99/55 | HR 77 | Temp 96.9°F | Resp 18

## 2020-01-10 DIAGNOSIS — R110 Nausea: Secondary | ICD-10-CM | POA: Diagnosis not present

## 2020-01-10 DIAGNOSIS — E538 Deficiency of other specified B group vitamins: Secondary | ICD-10-CM | POA: Diagnosis not present

## 2020-01-10 DIAGNOSIS — I959 Hypotension, unspecified: Secondary | ICD-10-CM

## 2020-01-10 DIAGNOSIS — C9000 Multiple myeloma not having achieved remission: Secondary | ICD-10-CM

## 2020-01-10 DIAGNOSIS — K5900 Constipation, unspecified: Secondary | ICD-10-CM | POA: Diagnosis not present

## 2020-01-10 DIAGNOSIS — D709 Neutropenia, unspecified: Secondary | ICD-10-CM | POA: Diagnosis not present

## 2020-01-10 DIAGNOSIS — D4989 Neoplasm of unspecified behavior of other specified sites: Secondary | ICD-10-CM | POA: Diagnosis not present

## 2020-01-10 DIAGNOSIS — D801 Nonfamilial hypogammaglobulinemia: Secondary | ICD-10-CM | POA: Diagnosis not present

## 2020-01-10 DIAGNOSIS — G894 Chronic pain syndrome: Secondary | ICD-10-CM

## 2020-01-10 DIAGNOSIS — I129 Hypertensive chronic kidney disease with stage 1 through stage 4 chronic kidney disease, or unspecified chronic kidney disease: Secondary | ICD-10-CM | POA: Diagnosis not present

## 2020-01-10 DIAGNOSIS — R197 Diarrhea, unspecified: Secondary | ICD-10-CM | POA: Diagnosis not present

## 2020-01-10 DIAGNOSIS — N183 Chronic kidney disease, stage 3 unspecified: Secondary | ICD-10-CM | POA: Diagnosis not present

## 2020-01-10 DIAGNOSIS — D649 Anemia, unspecified: Secondary | ICD-10-CM

## 2020-01-10 LAB — CBC WITH DIFFERENTIAL/PLATELET
Abs Immature Granulocytes: 0.01 10*3/uL (ref 0.00–0.07)
Basophils Absolute: 0.1 10*3/uL (ref 0.0–0.1)
Basophils Relative: 2 %
Eosinophils Absolute: 0.1 10*3/uL (ref 0.0–0.5)
Eosinophils Relative: 3 %
HCT: 28.4 % — ABNORMAL LOW (ref 36.0–46.0)
Hemoglobin: 10 g/dL — ABNORMAL LOW (ref 12.0–15.0)
Immature Granulocytes: 0 %
Lymphocytes Relative: 25 %
Lymphs Abs: 0.8 10*3/uL (ref 0.7–4.0)
MCH: 32.1 pg (ref 26.0–34.0)
MCHC: 35.2 g/dL (ref 30.0–36.0)
MCV: 91 fL (ref 80.0–100.0)
Monocytes Absolute: 0.6 10*3/uL (ref 0.1–1.0)
Monocytes Relative: 18 %
Neutro Abs: 1.6 10*3/uL — ABNORMAL LOW (ref 1.7–7.7)
Neutrophils Relative %: 52 %
Platelets: 164 10*3/uL (ref 150–400)
RBC: 3.12 MIL/uL — ABNORMAL LOW (ref 3.87–5.11)
RDW: 12.9 % (ref 11.5–15.5)
WBC: 3.1 10*3/uL — ABNORMAL LOW (ref 4.0–10.5)
nRBC: 0 % (ref 0.0–0.2)

## 2020-01-10 LAB — COMPREHENSIVE METABOLIC PANEL
ALT: 20 U/L (ref 0–44)
AST: 20 U/L (ref 15–41)
Albumin: 4.1 g/dL (ref 3.5–5.0)
Alkaline Phosphatase: 68 U/L (ref 38–126)
Anion gap: 10 (ref 5–15)
BUN: 16 mg/dL (ref 8–23)
CO2: 23 mmol/L (ref 22–32)
Calcium: 9.4 mg/dL (ref 8.9–10.3)
Chloride: 104 mmol/L (ref 98–111)
Creatinine, Ser: 1.21 mg/dL — ABNORMAL HIGH (ref 0.44–1.00)
GFR calc Af Amer: 55 mL/min — ABNORMAL LOW (ref >60)
GFR calc non Af Amer: 48 mL/min — ABNORMAL LOW (ref >60)
Glucose, Bld: 151 mg/dL — ABNORMAL HIGH (ref 70–99)
Potassium: 4.1 mmol/L (ref 3.5–5.1)
Sodium: 137 mmol/L (ref 135–145)
Total Bilirubin: 0.4 mg/dL (ref 0.3–1.2)
Total Protein: 6.6 g/dL (ref 6.5–8.1)

## 2020-01-10 LAB — IRON AND TIBC
Iron: 48 ug/dL (ref 28–170)
Saturation Ratios: 16 % (ref 10.4–31.8)
TIBC: 302 ug/dL (ref 250–450)
UIBC: 254 ug/dL

## 2020-01-10 LAB — FOLATE: Folate: 12.1 ng/mL (ref >5.9)

## 2020-01-10 LAB — MAGNESIUM: Magnesium: 1.1 mg/dL — ABNORMAL LOW (ref 1.7–2.4)

## 2020-01-10 LAB — FERRITIN: Ferritin: 51 ng/mL (ref 11–307)

## 2020-01-10 MED ORDER — PROMETHAZINE HCL 25 MG/ML IJ SOLN
25.0000 mg | Freq: Once | INTRAMUSCULAR | Status: AC
  Administered 2020-01-10: 25 mg via INTRAVENOUS

## 2020-01-10 MED ORDER — SODIUM CHLORIDE 0.9 % IV SOLN
6.0000 g | Freq: Once | INTRAVENOUS | Status: AC
  Administered 2020-01-10: 6 g via INTRAVENOUS
  Filled 2020-01-10: qty 12

## 2020-01-10 MED ORDER — DARATUMUMAB-HYALURONIDASE-FIHJ 1800-30000 MG-UT/15ML ~~LOC~~ SOLN
1800.0000 mg | Freq: Once | SUBCUTANEOUS | Status: AC
  Administered 2020-01-10: 1800 mg via SUBCUTANEOUS
  Filled 2020-01-10: qty 15

## 2020-01-10 MED ORDER — DEXAMETHASONE 4 MG PO TABS
20.0000 mg | ORAL_TABLET | Freq: Once | ORAL | Status: AC
  Administered 2020-01-10: 20 mg via ORAL

## 2020-01-10 MED ORDER — SODIUM CHLORIDE 0.9 % IV SOLN
Freq: Once | INTRAVENOUS | Status: AC
  Filled 2020-01-10: qty 250

## 2020-01-10 MED ORDER — HEPARIN SOD (PORK) LOCK FLUSH 100 UNIT/ML IV SOLN
500.0000 [IU] | Freq: Once | INTRAVENOUS | Status: AC
  Administered 2020-01-10: 500 [IU] via INTRAVENOUS
  Filled 2020-01-10: qty 5

## 2020-01-10 MED ORDER — SODIUM CHLORIDE 0.9% FLUSH
10.0000 mL | INTRAVENOUS | Status: DC | PRN
  Administered 2020-01-10: 10 mL via INTRAVENOUS
  Filled 2020-01-10: qty 10

## 2020-01-10 NOTE — Progress Notes (Signed)
No new changes noted today 

## 2020-01-10 NOTE — Progress Notes (Signed)
Patient states she took her benadryl and acetaminophen prior to coming into clinic today. Hypotensive this morning. Was given 1010ml of NS prior to Darzalex injection. Received 6 grams magnesium. Tolerated treatments well. No complaints of pain or dizziness at end of day. BP returned to her baseline.

## 2020-01-10 NOTE — Progress Notes (Signed)
[facility]  [address] [place], [place] [address] Phone: [phone]  Fax: [phone]   Clinic Day:  01/10/2020  Referring physician: Glean Hess, MD  Chief Complaint: Sarah [name] is a 64 y.o. 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  HPI: Patient was last seen by Dr. Mike Gip on 12/30/2019.  WBC was 2800 and treatment was held.  In the interim, she was seen by Dr. Fatima Sanger at [facility] hematology oncology.  She has chronically low blood pressure and says she feels fine today.  No dizziness or lightheadedness.  No recent falls.  Nausea is well controlled with Phenergan.  Appetite is stable but history of unintentional weight loss.  Continues to have generalized fatigue.  Currently on Pomalyst 3 mg.   Past Medical History:  Diagnosis Date  . Abnormal stress test 02/14/2016   Overview:  Added automatically from request for surgery [id number]  . Anemia   . Anxiety   . Arthritis   . Bicuspid aortic valve   . CHF (congestive heart failure) ([place])   . CKD (chronic kidney disease) stage 3, GFR 30-59 ml/min   . Depression   . Diabetes mellitus ([place])   . Dizziness   . Fatty liver   . Frequent falls   . GERD (gastroesophageal reflux disease)   . Gout   . Heart murmur   . History of blood transfusion   . History of bone marrow transplant ([place])   . History of uterine fibroid   . Hx of cardiac catheterization 06/05/2016   Overview:  Normal coronaries 2017  . Hypertension   . Hypomagnesemia   . Multiple myeloma ([place])   . Personal history of chemotherapy   . Renal cyst     Past Surgical History:  Procedure Laterality Date  . ABDOMINAL HYSTERECTOMY    . 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 CARDIAC ELECTROPHYSIOLOGY MAPPING AND ABLATION    . CARPAL TUNNEL RELEASE Bilateral   . CHOLECYSTECTOMY  2008  . COLONOSCOPY WITH PROPOFOL  N/A 05/07/2017   Procedure: COLONOSCOPY WITH PROPOFOL;  Surgeon: [name], MD;  Location: [facility] ENDOSCOPY;  Service: Gastroenterology;  Laterality: N/A;  . ESOPHAGOGASTRODUODENOSCOPY (EGD) WITH PROPOFOL N/A 05/07/2017   Procedure: ESOPHAGOGASTRODUODENOSCOPY (EGD) WITH PROPOFOL;  Surgeon: [name], MD;  Location: [facility] ENDOSCOPY;  Service: Gastroenterology;  Laterality: N/A;  . FOOT SURGERY Bilateral   . INCONTINENCE SURGERY  2009  . INTERSTIM IMPLANT PLACEMENT    . other     over active bladder  . OTHER SURGICAL HISTORY     bladder stimulator   . PARTIAL HYSTERECTOMY  03/1996   fibroids  . PORTA CATH INSERTION N/A 03/10/2019   Procedure: PORTA CATH INSERTION;  Surgeon: Algernon Huxley, MD;  Location: [facility] CV LAB;  Service: Cardiovascular;  Laterality: N/A;  . TONSILLECTOMY  2007    Family History  Problem Relation Age of Onset  . Colon cancer Father   . Renal Disease Father   . Diabetes Mellitus II Father   . Melanoma Paternal Grandmother   . Breast cancer Maternal Aunt 70  . Anemia Mother   . Heart disease Mother   . Heart failure Mother   . Renal Disease Mother   . Congestive Heart Failure Mother   . Heart disease Maternal Uncle   . Throat cancer Maternal Uncle   . Lung cancer Maternal Uncle   .  Liver disease Maternal Uncle   . Heart failure Maternal Uncle   . Hearing loss Son 58       Suicide     Social History:  reports that she quit smoking about 28 years ago. Her smoking use included cigarettes. She has a 20.00 pack-year smoking history. She has never used smokeless tobacco. She reports previous alcohol use. She reports that she does not use drugs. She is on disability. She notes exposure to perchloroethylene [facility]).She lives much of her adult life in [place]. She was married with 2 sons. Her husband passed away. Her 2 sons took their own lives (age 33 and 53). She worked in [employer] in [employer]. She went back to school and  earned an [employer] and works for [employer] for several years.She liveswith her sistersin [place]. The patient is alone today.  Allergies:  Allergies  Allergen Reactions  . Oxycodone-Acetaminophen Anaphylaxis    Swelling and rash  . Celebrex [Celecoxib] Diarrhea  . Codeine   . Benadryl [Diphenhydramine] Palpitations  . Morphine Itching and Rash  . Ondansetron Diarrhea  . Tylenol [Acetaminophen] Itching and Rash    Current Medications: Current Outpatient Medications  Medication Sig Dispense Refill  . acetaminophen (TYLENOL) 500 MG tablet Take 500 mg by mouth every 6 (six) hours as needed.    Marland Kitchen acyclovir (ZOVIRAX) 400 MG tablet Take 1 tablet (400 mg total) by mouth 2 (two) times daily. 60 tablet 11  . allopurinol (ZYLOPRIM) 100 MG tablet TAKE 1 TABLET(100 MG) BY MOUTH DAILY as needed 90 tablet 1  . aspirin 81 MG chewable tablet Chew 1 tablet (81 mg total) by mouth daily. 30 tablet 0  . dexamethasone (DECADRON) 4 MG tablet Take 5 tablets (86m) on days 2, 9, 16, 23 of cycle 1 and 2 Take 5 tablets (27m on days 2, 16 of cycle 3,4,5 and 6 Take 5 tablets (2015mon days 2 of cycles 7 and beyond 120 tablet 0  . diclofenac sodium (VOLTAREN) 1 % GEL Apply 2 g topically 4 (four) times daily. 100 g 1  . diphenhydrAMINE (BENADRYL) 50 MG capsule Take 50 mg by mouth as needed.     . diphenoxylate-atropine (LOMOTIL) 2.5-0.025 MG tablet Take 2 tablets by mouth daily as needed for diarrhea or loose stools. 45 tablet 2  . fentaNYL (DURAGESIC - DOSED MCG/HR) 25 MCG/HR patch Place 25 mcg onto the skin every 3 (three) days.     . fexofenadine (ALLEGRA) 180 MG tablet TAKE 1 TABLET(180 MG) BY MOUTH DAILY 30 tablet 5  . fluconazole (DIFLUCAN) 150 MG tablet Take 1 tablet (150 mg total) by mouth daily. (Patient not taking: Reported on 12/29/2019) 1 tablet 0  . FLUoxetine (PROZAC) 40 MG capsule TAKE 1 CAPSULE EVERY DAY 90 capsule 1  . fluticasone (FLONASE) 50 MCG/ACT nasal spray Place 2 sprays into both nostrils  daily. 16 g 2  . furosemide (LASIX) 20 MG tablet Take 20 mg by mouth as needed.     . gMarland Kitchenucose blood (ONE TOUCH ULTRA TEST) test strip     . hydrOXYzine (ATARAX/VISTARIL) 10 MG tablet TAKE 1 TABLET(10 MG) BY MOUTH THREE TIMES DAILY AS NEEDED 90 tablet 0  . lactulose (CHRONULAC) 10 GM/15ML solution     . lidocaine-prilocaine (EMLA) cream APPLY TOPICALLY AS NEEDED. 30 g 0  . lisinopril (ZESTRIL) 10 MG tablet Take 10 mg by mouth once.    . metFORMIN (GLUCOPHAGE-XR) 500 MG 24 hr tablet Take 1 tablet (500 mg total) by  mouth daily with breakfast. 90 tablet 1  . Misc Natural Products (OSTEO BI-FLEX ADV TRIPLE ST PO) Take 2 tablets by mouth daily.    . montelukast (SINGULAIR) 10 MG tablet Take 1 tablet by mouth on the day before treatment and for 2 days after treatment. 30 tablet 0  . Multiple Vitamins-Minerals (HAIR/SKIN/NAILS/BIOTIN PO) Take 1 capsule by mouth 3 (three) times daily.    Marland Kitchen NARCAN 4 MG/0.1ML LIQD nasal spray kit 1 spray.     . Omega 3-6-9 Fatty Acids (OMEGA 3-6-9 COMPLEX) CAPS Take by mouth.    Marland Kitchen omeprazole (PRILOSEC) 40 MG capsule TAKE 1 CAPSULE EVERY DAY 90 capsule 3  . POMALYST 3 MG capsule TAKE 1 CAPSULE BY MOUTH EVERY DAY 21 capsule 0  . promethazine (PHENERGAN) 25 MG tablet Take 1 tablet (25 mg total) by mouth every 6 (six) hours as needed for nausea or vomiting. 90 tablet 0  . promethazine-dextromethorphan (PROMETHAZINE-DM) 6.25-15 MG/5ML syrup Take 5 mLs by mouth 4 (four) times daily as needed for cough. 240 mL 0  . tiZANidine (ZANAFLEX) 4 MG tablet Take 1 tablet (4 mg total) by mouth every 8 (eight) hours as needed for muscle spasms. 270 tablet 1  . traZODone (DESYREL) 100 MG tablet TAKE 1 TABLET AT BEDTIME  FOR  SLEEP 90 tablet 1   No current facility-administered medications for this visit.   Facility-Administered Medications Ordered in Other Visits  Medication Dose Route Frequency Provider Last Rate Last Admin  . heparin lock flush 100 unit/mL  500 Units Intravenous Once  Corcoran, Melissa C, MD      . sodium chloride flush (NS) 0.9 % injection 10 mL  10 mL Intravenous PRN Lequita Asal, MD   10 mL at 01/10/20 0910    Review of Systems  Constitutional: Positive for malaise/fatigue and weight loss (3 pounds in the last month). Negative for chills, diaphoresis and fever.       She is "hanging in there."  HENT: Negative for congestion, ear discharge, ear pain, hearing loss, nosebleeds, sinus pain, sore throat and tinnitus.   Eyes: Negative.  Negative for blurred vision.  Respiratory: Negative for cough, hemoptysis, sputum production, shortness of breath and wheezing.   Cardiovascular: Negative.  Negative for chest pain, palpitations and leg swelling.  Gastrointestinal: Positive for constipation (Intermittent), diarrhea (Intermittent) and nausea (chronic s/p gallbladder surgery, stable with medication). Negative for abdominal pain, blood in stool, heartburn, melena and vomiting.  Genitourinary: Negative for dysuria, frequency, hematuria and urgency.  Musculoskeletal: Negative for back pain (chronic; paraspinal tenderness; upper back), falls, joint pain, myalgias and neck pain.  Skin: Negative for itching and rash.  Neurological: Positive for weakness (Chronic). Negative for dizziness, tingling, sensory change (chronic numbness in fingers and toes, stable) and headaches.  Endo/Heme/Allergies: Negative.  Negative for environmental allergies. Does not bruise/bleed easily.       Diabetes, under good control.  Psychiatric/Behavioral: Negative.  Negative for depression and memory loss. The patient is not nervous/anxious and does not have insomnia.   All other systems reviewed and are negative.   Performance status (ECOG): 1  Vitals: Today's Vitals   01/10/20 0933 01/10/20 0940 01/10/20 0941  BP: (!) 91/48 (!) 78/47 (!) 65/30  Pulse: 77 75 82  Temp: (!) 97.2 F (36.2 C)    TempSrc: Tympanic    SpO2: 100%    Weight: 141 lb 3.3 oz (64 kg)     Body mass  index is 25.83 kg/m.  Physical Exam Vitals and nursing  note reviewed.  Constitutional:      General: She is not in acute distress.    Appearance: She is well-developed. She is not diaphoretic.     Interventions: Face mask in place.  HENT:     Head: Normocephalic and atraumatic.     Mouth/Throat:     Mouth: Mucous membranes are moist.     Pharynx: Oropharynx is clear.  Eyes:     General: No scleral icterus.    Conjunctiva/sclera: Conjunctivae normal.     Pupils: Pupils are equal, round, and reactive to light.     Comments: Glasses. Brown eyes.   Cardiovascular:     Rate and Rhythm: Normal rate and regular rhythm.     Pulses: Normal pulses.     Heart sounds: Normal heart sounds. No murmur heard.   Pulmonary:     Effort: Pulmonary effort is normal. No respiratory distress.     Breath sounds: Normal breath sounds. No wheezing or rales.  Chest:     Chest wall: No tenderness.  Abdominal:     General: Bowel sounds are normal. There is no distension.     Palpations: Abdomen is soft. There is no mass.     Tenderness: There is no abdominal tenderness. There is no guarding or rebound.  Musculoskeletal:        General: No swelling or tenderness. Normal range of motion.     Cervical back: Normal range of motion and neck supple.  Lymphadenopathy:     Head:     Right side of head: No preauricular, posterior auricular or occipital adenopathy.     Left side of head: No preauricular, posterior auricular or occipital adenopathy.     Cervical: No cervical adenopathy.     Upper Body:     Right upper body: No supraclavicular or axillary adenopathy.     Left upper body: No supraclavicular or axillary adenopathy.     Lower Body: No right inguinal adenopathy. No left inguinal adenopathy.  Skin:    General: Skin is warm and dry.  Neurological:     Mental Status: She is alert and oriented to person, place, and time. Mental status is at baseline.  Psychiatric:        Mood and Affect: Mood  normal.        Behavior: Behavior normal.        Thought Content: Thought content normal.        Judgment: Judgment normal.    Infusion on 01/10/2020  Component Date Value Ref Range Status  . Sodium 01/10/2020 137  135 - 145 mmol/L Final  . Potassium 01/10/2020 4.1  3.5 - 5.1 mmol/L Final  . Chloride 01/10/2020 104  98 - 111 mmol/L Final  . CO2 01/10/2020 23  22 - 32 mmol/L Final  . Glucose, Bld 01/10/2020 151* 70 - 99 mg/dL Final   Glucose reference range applies only to samples taken after fasting for at least 8 hours.  . BUN 01/10/2020 16  8 - 23 mg/dL Final  . Creatinine, Ser 01/10/2020 1.21* 0.44 - 1.00 mg/dL Final  . Calcium 01/10/2020 9.4  8.9 - 10.3 mg/dL Final  . Total Protein 01/10/2020 6.6  6.5 - 8.1 g/dL Final  . Albumin 01/10/2020 4.1  3.5 - 5.0 g/dL Final  . AST 01/10/2020 20  15 - 41 U/L Final  . ALT 01/10/2020 20  0 - 44 U/L Final  . Alkaline Phosphatase 01/10/2020 68  38 - 126 U/L Final  . Total Bilirubin  01/10/2020 0.4  0.3 - 1.2 mg/dL Final  . GFR calc non Af Amer 01/10/2020 48* >60 mL/min Final  . GFR calc Af Amer 01/10/2020 55* >60 mL/min Final  . Anion gap 01/10/2020 10  5 - 15 Final   Performed at [facility], [address] [place], [place] [address]  . WBC 01/10/2020 3.1* 4.0 - 10.5 K/uL Final  . RBC 01/10/2020 3.12* 3.87 - 5.11 MIL/uL Final  . Hemoglobin 01/10/2020 10.0* 12.0 - 15.0 g/dL Final  . HCT 01/10/2020 28.4* 36 - 46 % Final  . MCV 01/10/2020 91.0  80.0 - 100.0 fL Final  . MCH 01/10/2020 32.1  26.0 - 34.0 pg Final  . MCHC 01/10/2020 35.2  30.0 - 36.0 g/dL Final  . RDW 01/10/2020 12.9  11.5 - 15.5 % Final  . Platelets 01/10/2020 164  150 - 400 K/uL Final  . nRBC 01/10/2020 0.0  0.0 - 0.2 % Final  . Neutrophils Relative % 01/10/2020 52  % Final  . Neutro Abs 01/10/2020 1.6* 1.7 - 7.7 K/uL Final  . Lymphocytes Relative 01/10/2020 25  % Final  . Lymphs Abs 01/10/2020 0.8  0.7 - 4.0 K/uL Final  . Monocytes Relative 01/10/2020 18  %  Final  . Monocytes Absolute 01/10/2020 0.6  0 - 1 K/uL Final  . Eosinophils Relative 01/10/2020 3  % Final  . Eosinophils Absolute 01/10/2020 0.1  0 - 0 K/uL Final  . Basophils Relative 01/10/2020 2  % Final  . Basophils Absolute 01/10/2020 0.1  0 - 0 K/uL Final  . Immature Granulocytes 01/10/2020 0  % Final  . Abs Immature Granulocytes 01/10/2020 0.01  0.00 - 0.07 K/uL Final   Performed at [facility], [address], [place], [place] [address]  . Magnesium 01/10/2020 1.1* 1.7 - 2.4 mg/dL Final   Performed at [facility], [address] [place], [place] [address]    Assessment:  Sarah [name] is a 64 y.o. 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 Bone marrow revealed 80% plasma cells. Lambda free light chains were 1340. She had nephrotic range proteinuria. She initially underwent induction with RVD. Revlimid maintenance was discontinued on 01/21/2017 secondary to intolerance.   Bone marrowaspirate andbiopsyon 01/18/2021revealed anormocellularmarrow withbut increased lambda-restricted plasma cells (9% aspirate, 40% CD138 immunohistochemistry).Findingswereconsistent with recurrent plasma cell myeloma.Flow cytometry revealed no monoclonal B-cell or phenotypically aberrant T-cell population. Cytogeneticswere 84, XX (normal). 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  M-spikehas been followed: 0 on 04/02/2016 -06/23/2019; 0.2 on 09/02/2017 and 0.1 on 11/25/2019.  Lambda light chainshave been followed: 22.2 (ratio 0.56) on 07/03/2017, 30.8 (ratio 0.78) on 09/02/2017, 36.9 (ratio 0.40) on 10/21/2017, 37.4 (ratio 0.41) on 12/16/2017, 70.7(ratio 0.31) on 02/17/2018, 64.2 (ratio 0.27) on 04/07/2018, 78.9 (ratio 0.18) on 05/26/2018, 128.8 (ratio 0.17) on 08/06/2018, 181.5 (ratio 0.13) on 10/08/2018, 130.9 (ratio 0.13) on 10/20/2018,  160.7 (ratio 0.10)on 12/09/2018, 236.6 (ratio 0.07) on 02/01/2019, 363.6 (ratio 0.04) on 03/22/2019, 404.8 (ratio 0.04) on 04/05/2019, 420.7 (ratio 0.03) on 05/24/2019, 573.4 (ratio 0.03) on 06/23/2019, 451.05(ratio 0.02) on02/19/2021, 47.2 (ratio 0.13) on 10/25/2019, and 22.4 (ratio 0.21) on 11/25/2019.  24 hour UPEPon 06/03/2019 revealed kappa free light chains95.76,lambda free light chains1,260.71, andratio 0.08.24 hourUPEPon 02/22/2021revealed totalproteinof782m/24 hrs withlambdafree light chains 1,084.171mL andratioof0.10(1.03-31.76). M spike inurinewas46.1%(36166m4 hrs).  Bone surveyon 04/08/2016 and11/28/2018 revealed no definite lytic lesion seen in the visualized skeleton.Bone surveyon 11/19/2018  revealed no suspicious lucent lesionsand no acute bony abnormality.PET scanon 07/12/2019 revealed no focal metabolic activity to suggest active myeloma within the skeleton. There wereno lytic lesions identified on the CT portion of the examorsoft tissue plasmacytomas. There was no evidence of multiple myeloma.  Pretreatment RBC phenotypeon 09/23/2019 waspositivefor C, e, DUFFY B, KIDD B, M, S, and s antigen; negativefor c, E, KELL, DUFFY A, KIDD A, and N antigen.  Sheis day 22 ofcycle #3 daratumumab and hyaluronidase-fihj, Pomalyst, and Decadron (DPd)(09/27/2019 - 10/25/2019; 12/09/2019). Cycle #1 was complicated by fever and neutropenia requiring admission.  Cycle #2 was complicated by pneumonia requiring admission.  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 Zometa was discontinued in 01/2017. She has chronic nauseaon Phenergan.  She has B12 deficiency. B12 was 254 on 04/09/2017, 295 on 08/20/2018, and 391 on 10/08/2018. Shewasonoral B12.She received B12 monthly (last06/17/2021).Folate was18.6on 02/08/2019.  She has iron deficiency. Ferritin was 32 on 07/01/2019. She received Venoferon 07/15/2019  and 07/22/2019.  She was admitted to [facility] 10/15/2019 - 10/20/2019 for fever and neutropenia. Cultures were negative. CXR was negative. She received broad spectrum antibiotics and daily Granix. She received IVF for acute renal insufficiency due to diarrhea and dehydration. Creatine was 1.66 on admission and 1.12 on discharge.  She received her second COVID-19vaccineon 10/09/2019.  Symptomatically she feels "weak and tired" but it is no worse.  Hypotensive today.  Continued weight loss.  10 pounds over the past 3 months, 3 in the last month.  Encouraged nutritional consult.  We will send referral today.  Continues to have functional limitations.  Will refer to physical therapy for evaluation and treatment.  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  Awaiting consultation with oral medicine which was coordinated by [facility].    Plan: 1.   Labs today:  CBC with diff, CMP, Mg. 2. Stage III multiple myeloma  -Lambda light chain multiple myeloma.   - lambda light chains and protein ratio have normalized consistent with very good partial response or greater. Hematologic markers of also improved.  -Awaiting today's labs  - Hx of neutropenic fever with cycle 1 and pneumonia of cycle 2. Leukopenia at day 22 of cycle 3 prompted holding of of daratumumab.   - Scheduled to see bone marrow transplant team on August 3.    -Reviewed [facility] recommendations to continue daratumumab, pomalidomide, and dexamethasone therapy with dose reductions.  Pomalidomide 3 mg for next and subsequent cycle though reduced in frequency to be given on days 1 through 14 as opposed to days 1 through 21 to allow for bone marrow recovery and hopefully improvement in her fatigue though pomalidomide may be contributing.  Reviewed the recommendation to not hold daratumumab for leukopenia alone in the absence of neutropenic fever and continue daratumumab as scheduled.  Pomalidomide 3 mg on days 1  through 14 Daratumumab as previous dexamethasone 20 mg on days 1, 2, 4, 5, 8, 9, 11, 12  3. Unintentional weight loss  Etiology unclear.  Reports good appetite.  9 pound weight loss over the past 3 months, 3, weight loss over the last month.    - Referral to dietitian for evaluation and monitoring of trends. 4.  Functional limitations and fatigue  - Myeloma burden has improved though she continues to have functional impairments.  Question if secondary to previous infections and hospitalizations vs deconditioning.  -Refer to physical therapy/Maureen Dupreez 5. Hypogammaglobulinemia  -Serum IgG was 229 on 12/31/2019.  Given  recent severe infections IVIG was recommended by Dr. Fatima Sanger at [facility].  She also recommended that she receive IVIG monthly until serum IgG levels are 400-500.   - Will defer to Dr. Mike Gip then plan to monitor IgG levels after she received IVIG for monitoring. 6.  History of osteonecrosis of the jaw  - Dental issues and poor dentition with history of osteonecrosis of the jaw with prior bisphosphonate use  -Awaiting evaluation by oral medicine at [facility]  -Continue to monitor.  7.  Hypomagnesemia  -Severe and chronic  -Magnesium 1.1 today.  Worse.  Plan for 6 g magnesium today.   -Continue IV magnesium supplement twice weekly due to GI side effects 8.  Normocytic anemia  Hemoglobin 10, hematocrit 28.4.  MCV 91.  Platelets 164.  Improved.  -Awaiting iron studies  -TSH was normal on 08/20/2018  -Hemoglobin is stable compared to prior cycles likely reflective of overall improvement in health of bone marrow.    - Continue to monitor. 9. B12 deficiency  -Last B12 on 12/16/2019.  - Folate was 18.6 on 02/08/2019  - Plan to continue monthly B12 injections  -Plan to recheck folate in August 2021 10.  Grade 2 peripheral neuropathy  -Secondary to prior chemotherapy  -Stable.  -Continue to monitor. 11. Nausea  -Chronic.  -Stable.  -Continue Phenergan as prescribed by Dr.  Mike Gip 12. Hypotension  - etiology unclear. Question medication induced.   - BP 65/30 initially. Asymptomatic. Improved with IV fluids. Encouraged hydration. Hold lasix.   - advised patient to f/u with Dr. Army Melia for consideration of adjustment of BP medications  Disposition: IV fluids in clinic today for hypotension.  RTC on 7/15 for mag RTC on 7/19 for lab & MD evaluation. Discuss IVIG at that time and consideration of day 8 of daratumab. B12 at that time.  Referral to [name], RD and Rosalyn Gess, PT  I discussed the assessment and treatment plan with the patient.  The patient was provided an opportunity to ask questions and all were answered.  The patient agreed with the plan and demonstrated an understanding of the instructions.  The patient was advised to call back if the symptoms worsen or if the condition fails to improve as anticipated.  Beckey Rutter, DNP, AGNP-C [place] at [facility] [phone] (clinic)  CC: Dr. Mike Gip & Dr. Army Melia

## 2020-01-11 LAB — KAPPA/LAMBDA LIGHT CHAINS
Kappa free light chain: 5.5 mg/L (ref 3.3–19.4)
Kappa, lambda light chain ratio: 0.33 (ref 0.26–1.65)
Lambda free light chains: 16.5 mg/L (ref 5.7–26.3)

## 2020-01-11 LAB — PROTEIN ELECTROPHORESIS, SERUM
A/G Ratio: 1.9 — ABNORMAL HIGH (ref 0.7–1.7)
Albumin ELP: 3.8 g/dL (ref 2.9–4.4)
Alpha-1-Globulin: 0.2 g/dL (ref 0.0–0.4)
Alpha-2-Globulin: 0.8 g/dL (ref 0.4–1.0)
Beta Globulin: 0.8 g/dL (ref 0.7–1.3)
Gamma Globulin: 0.2 g/dL — ABNORMAL LOW (ref 0.4–1.8)
Globulin, Total: 2 g/dL — ABNORMAL LOW (ref 2.2–3.9)
M-Spike, %: 0.1 g/dL — ABNORMAL HIGH
Total Protein ELP: 5.8 g/dL — ABNORMAL LOW (ref 6.0–8.5)

## 2020-01-12 ENCOUNTER — Other Ambulatory Visit: Payer: Self-pay

## 2020-01-12 DIAGNOSIS — D4989 Neoplasm of unspecified behavior of other specified sites: Secondary | ICD-10-CM | POA: Diagnosis not present

## 2020-01-12 DIAGNOSIS — C9000 Multiple myeloma not having achieved remission: Secondary | ICD-10-CM | POA: Diagnosis not present

## 2020-01-12 MED ORDER — POMALIDOMIDE 3 MG PO CAPS: ORAL_CAPSULE | ORAL | 0 refills | Status: DC

## 2020-01-13 ENCOUNTER — Encounter: Payer: Self-pay | Admitting: Nurse Practitioner

## 2020-01-13 ENCOUNTER — Inpatient Hospital Stay: Payer: Medicare PPO

## 2020-01-13 ENCOUNTER — Telehealth: Payer: Self-pay

## 2020-01-13 ENCOUNTER — Other Ambulatory Visit: Payer: Self-pay

## 2020-01-13 ENCOUNTER — Encounter: Payer: Self-pay | Admitting: Hematology and Oncology

## 2020-01-13 VITALS — BP 112/67 | HR 86 | Temp 99.0°F | Resp 18

## 2020-01-13 DIAGNOSIS — N183 Chronic kidney disease, stage 3 unspecified: Secondary | ICD-10-CM | POA: Diagnosis not present

## 2020-01-13 DIAGNOSIS — R197 Diarrhea, unspecified: Secondary | ICD-10-CM | POA: Diagnosis not present

## 2020-01-13 DIAGNOSIS — K5900 Constipation, unspecified: Secondary | ICD-10-CM | POA: Diagnosis not present

## 2020-01-13 DIAGNOSIS — D709 Neutropenia, unspecified: Secondary | ICD-10-CM | POA: Diagnosis not present

## 2020-01-13 DIAGNOSIS — C9000 Multiple myeloma not having achieved remission: Secondary | ICD-10-CM | POA: Diagnosis not present

## 2020-01-13 DIAGNOSIS — D801 Nonfamilial hypogammaglobulinemia: Secondary | ICD-10-CM | POA: Diagnosis not present

## 2020-01-13 DIAGNOSIS — R110 Nausea: Secondary | ICD-10-CM | POA: Diagnosis not present

## 2020-01-13 DIAGNOSIS — E538 Deficiency of other specified B group vitamins: Secondary | ICD-10-CM | POA: Diagnosis not present

## 2020-01-13 DIAGNOSIS — I129 Hypertensive chronic kidney disease with stage 1 through stage 4 chronic kidney disease, or unspecified chronic kidney disease: Secondary | ICD-10-CM | POA: Diagnosis not present

## 2020-01-13 LAB — MAGNESIUM: Magnesium: 1 mg/dL — ABNORMAL LOW (ref 1.7–2.4)

## 2020-01-13 MED ORDER — HEPARIN SOD (PORK) LOCK FLUSH 100 UNIT/ML IV SOLN
500.0000 [IU] | Freq: Once | INTRAVENOUS | Status: AC
  Administered 2020-01-13: 500 [IU] via INTRAVENOUS
  Filled 2020-01-13: qty 5

## 2020-01-13 MED ORDER — SODIUM CHLORIDE 0.9 % IV SOLN
6.0000 g | Freq: Once | INTRAVENOUS | Status: AC
  Administered 2020-01-13: 6 g via INTRAVENOUS
  Filled 2020-01-13: qty 12

## 2020-01-13 MED ORDER — CYANOCOBALAMIN 1000 MCG/ML IJ SOLN
1000.0000 ug | Freq: Once | INTRAMUSCULAR | Status: AC
  Administered 2020-01-13: 1000 ug via INTRAMUSCULAR

## 2020-01-13 MED ORDER — SODIUM CHLORIDE 0.9 % IV SOLN
INTRAVENOUS | Status: DC
  Filled 2020-01-13: qty 250

## 2020-01-13 NOTE — Progress Notes (Signed)
No new changes noted today. The Name and DOB has been verified in clinic today.

## 2020-01-13 NOTE — Telephone Encounter (Signed)
Relvmid has been faxed to the patient pharmacy again today. fax conformation received.

## 2020-01-14 ENCOUNTER — Other Ambulatory Visit: Payer: Self-pay | Admitting: *Deleted

## 2020-01-14 ENCOUNTER — Telehealth: Payer: Self-pay

## 2020-01-14 DIAGNOSIS — N3944 Nocturnal enuresis: Secondary | ICD-10-CM | POA: Diagnosis not present

## 2020-01-14 DIAGNOSIS — C9000 Multiple myeloma not having achieved remission: Secondary | ICD-10-CM

## 2020-01-14 DIAGNOSIS — N3941 Urge incontinence: Secondary | ICD-10-CM | POA: Diagnosis not present

## 2020-01-14 DIAGNOSIS — N3946 Mixed incontinence: Secondary | ICD-10-CM | POA: Diagnosis not present

## 2020-01-14 MED ORDER — POMALIDOMIDE 3 MG PO CAPS: ORAL_CAPSULE | ORAL | 0 refills | Status: DC

## 2020-01-14 NOTE — Telephone Encounter (Signed)
Spoke with the pharmacy to see what was going on with the patient pomalyst 3 mg. They was able to get the faxed refill and they will ship the patient medication out today.Spoke with the patient to inform her that I was able to speak with the Pharmacy and they will get the patient script shipped out today. The patient was understanding and agreeable. This has been the 3 rd time I have sent the script to the pharmacy.The patient is understanding and agreeable.

## 2020-01-14 NOTE — Telephone Encounter (Signed)
Dr Mike Gip states that there is a change in her medication I have included [facility] opinion below  "Today we discussed decreasing pomalidomide to 3 mg with the next cycle which is scheduled to begin on July 12 th . I also recommend decreasing the frequency of pomalidomide to be given for 2 weeks on followed by 2 weeks off allowing a longer period of recovery of your blood cells and your symptoms of fatigue. I will also discuss with Dr. Mike Gip the possibility of giving you a single dose intravenous immunoglobulin to help strengthen your immune system.    Did you have this conversation ? If so would you like to change the script at this cycle or would you like to wait on the next script. If you would like to change it? They will need a new Rem's and new script. Please advise     I have sent this message to Dr Mike Gip

## 2020-01-14 NOTE — Telephone Encounter (Signed)
I Spoke with the patient to inform her At this time the provider from [facility] has not spoken with DR C at this time, At this time the patient is to countine the current dose which was called in. I have spoken with the patient also to inform her that at this time there has been no communication between the providers. The patient was understanding i have spoken with the pharmacy again and informed them and they will ship the medication. The patient will be in office on Monday to see Dr Mike Gip.

## 2020-01-16 NOTE — Progress Notes (Signed)
[facility]  [address] [place], [place] [address] Phone: [phone]  Fax: [phone]   Clinic Day:  01/17/2020  Referring physician: Glean Hess, MD  Chief Complaint: Sarah [name] is a 64 y.o. 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   HPI: The patient was last seen in the medical oncology clinic on 12/30/2019. At that time,  she felt "weak and tired".  Neuropathy was stable.  She had intermittent diarrhea and constipation.  Exam was stable. Hematocrit was 30.7, hemoglobin 10.7, platelets 112,000, and WBC 2,800. Magnesium was 1.3. She received 4 gm of magnesium. Patient agreed to continue IV magnesium supplement twice weekly.   She was seen by Dr. Fatima Sanger at [facility] on 12/31/2019. Dr. Fatima Sanger discussed contnuation of pomalidomide 3 mg with the next cycle which was scheduled to begin on 07/12/201.  Dr. Fatima Sanger recommended decreasing the frequency of pomalidomide to 2 weeks on and 2 weeks off allowing a longer period of recovery secondary to low blood counts and fatigue.  The patient was seen in the infusion center on 01/06/2020 and noted having a bad "stomach days" with nausea, vomiting and diarrhea. She felt like it related to eating sauerkraut. She noted a runny nose and a temperature of 99.2 and felt like she had a cold.   On 01/07/2020, she reportedly felt better. Her runny nose was slightly better. She felt fatigued. She denied any nausea, vomiting, or diarrhea.   She received IV magnesium x 4 (01/04/2020 - 01/13/2020).  Magnesium followed: 1.2 on 01/04/2020, 1.7 on 07/08/202, 1.4 on 01/07/2020, 1.1 on 01/10/2020, 1.0 on 01/13/2020.  The patient was seen in the medical oncology clinic by Beckey Rutter, NP on 01/10/2020. At that time, she felt "weak and tired" but it was no worse. She was hypotensive.  She noted continued weight loss  (10 pounds over the past 3 months and 3 pounds in the last month).  Zenia Resides, NP encouraged nutritional consult and sent a referral.  She continued to have functional limitations. Zenia Resides, NP referred patient to physical therapy for evaluation and treatment.  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    Labs on 01/10/2020 included a hematocrit 28.4, hemoglobin 10.0, platelets 164,000, WBC 3,100 (ANC 1,600). Ferritin was 51 with an iron saturation of 16% and a TIBC of 302. Creatinine was 1.21. Folate 12.1. M spike was 0.1 gm/dL. Kappa/lambda light chains and ratio were normal.   CBC followed: 01/04/2020: Hematocrit 28.4, hemoglobin 10.0, platelets 139,000, WBC 3,600.  01/06/2020: Hematocrit 27.8, hemoglobin 9.9, platelets 149,000, WBC 3,600.   During the interim, she was "hanging in there". She continues to feel weak and fatigued. She has ongoing nausea that is worsening. Nausea is worse with meals. If she does not eat she will drink protein shakes. She is taking vitamins regularly.   Beckey Rutter, NP referred her to physical therapy but she is active at home. She has a treadmill and bicycle that she uses to stay active.  She declines PT.  Her neuropathy is stable.   Patient will have a consult with oral medicine at [facility] on 01/27/2020. She will see the transplant team at [facility] on 02/01/2020. [facility] would like for her to receive IVIG once a month. Patient agreed to receive IVIG on Thursdays.  IgG trough  goal is 500.  She took her premeds (Tylenol, Benadryl, singular).    Past Medical History:  Diagnosis Date  . Abnormal stress test 02/14/2016   Overview:  Added automatically from request for surgery [id number]  . Anemia   . Anxiety   . Arthritis   . Bicuspid aortic valve   . CHF (congestive heart failure) ([place])   . CKD (chronic kidney disease) stage 3, GFR 30-59 ml/min   . Depression   . Diabetes  mellitus ([employer])   . Dizziness   . Fatty liver   . Frequent falls   . GERD (gastroesophageal reflux disease)   . Gout   . Heart murmur   . History of blood transfusion   . History of bone marrow transplant ([place])   . History of uterine fibroid   . Hx of cardiac catheterization 06/05/2016   Overview:  Normal coronaries 2017  . Hypertension   . Hypomagnesemia   . Multiple myeloma ([place])   . Personal history of chemotherapy   . Renal cyst     Past Surgical History:  Procedure Laterality Date  . ABDOMINAL HYSTERECTOMY    . 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 CARDIAC ELECTROPHYSIOLOGY MAPPING AND ABLATION    . CARPAL TUNNEL RELEASE Bilateral   . CHOLECYSTECTOMY  2008  . COLONOSCOPY WITH PROPOFOL N/A 05/07/2017   Procedure: COLONOSCOPY WITH PROPOFOL;  Surgeon: [name], MD;  Location: [facility] ENDOSCOPY;  Service: Gastroenterology;  Laterality: N/A;  . ESOPHAGOGASTRODUODENOSCOPY (EGD) WITH PROPOFOL N/A 05/07/2017   Procedure: ESOPHAGOGASTRODUODENOSCOPY (EGD) WITH PROPOFOL;  Surgeon: [name], MD;  Location: [facility] ENDOSCOPY;  Service: Gastroenterology;  Laterality: N/A;  . FOOT SURGERY Bilateral   . INCONTINENCE SURGERY  2009  . INTERSTIM IMPLANT PLACEMENT    . other     over active bladder  . OTHER SURGICAL HISTORY     bladder stimulator   . PARTIAL HYSTERECTOMY  03/1996   fibroids  . PORTA CATH INSERTION N/A 03/10/2019   Procedure: PORTA CATH INSERTION;  Surgeon: Algernon Huxley, MD;  Location: [place] LAB;  Service: Cardiovascular;  Laterality: N/A;  . TONSILLECTOMY  2007    Family History  Problem Relation Age of Onset  . Colon cancer Father   . Renal Disease Father   . Diabetes Mellitus II Father   . Melanoma Paternal Grandmother   . Breast cancer Maternal Aunt 1  . Anemia Mother   . Heart disease Mother   . Heart failure Mother   . Renal Disease Mother   . Congestive Heart Failure Mother   . Heart disease Maternal Uncle   . Throat cancer Maternal Uncle   .  Lung cancer Maternal Uncle   . Liver disease Maternal Uncle   . Heart failure Maternal Uncle   . Hearing loss Son 17       Suicide     Social History:  reports that she quit smoking about 28 years ago. Her smoking use included cigarettes. She has a 20.00 pack-year smoking history. She has never used smokeless tobacco. She reports previous alcohol use. She reports that she does not use drugs. Patient has not had ETOH in several months. She is on disability. She notes exposure to perchloroethylene [facility]).She lives much of her adult life in [place]. She was married with 2 sons. Her husband passed away. Her 2 sons took their own lives (age 53 and 84). She worked in [employer] in [employer]. 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 The patient is alone today.  Allergies:  Allergies  Allergen Reactions  . Oxycodone-Acetaminophen Anaphylaxis    Swelling and rash  . Celebrex [Celecoxib] Diarrhea  . Codeine   . Plerixafor     In 2016 during ASCT collection patient developed fever to 103.15F and required hospitalization  . Benadryl [Diphenhydramine] Palpitations  . Morphine Itching and Rash  . Ondansetron Diarrhea  . Tylenol [Acetaminophen] Itching and Rash    Current Medications: Current Outpatient Medications  Medication Sig Dispense Refill  . acetaminophen (TYLENOL) 500 MG tablet Take 500 mg by mouth every 6 (six) hours as needed.    Marland Kitchen acyclovir (ZOVIRAX) 400 MG tablet Take 1 tablet (400 mg total) by mouth 2 (two) times daily. 60 tablet 11  . allopurinol (ZYLOPRIM) 100 MG tablet TAKE 1 TABLET(100 MG) BY MOUTH DAILY as needed 90 tablet 1  . aspirin 81 MG chewable tablet Chew 1 tablet (81 mg total) by mouth daily. 30 tablet 0  . diclofenac sodium (VOLTAREN) 1 % GEL Apply 2 g topically 4 (four) times daily. 100 g 1  . diphenhydrAMINE (BENADRYL) 50 MG capsule Take 50 mg by mouth as  needed.     . fentaNYL (DURAGESIC - DOSED MCG/HR) 25 MCG/HR patch Place 25 mcg onto the skin every 3 (three) days.     . fexofenadine (ALLEGRA) 180 MG tablet TAKE 1 TABLET(180 MG) BY MOUTH DAILY 30 tablet 5  . FLUoxetine (PROZAC) 40 MG capsule TAKE 1 CAPSULE EVERY DAY 90 capsule 1  . fluticasone (FLONASE) 50 MCG/ACT nasal spray Place 2 sprays into both nostrils daily. 16 g 2  . furosemide (LASIX) 20 MG tablet Take 20 mg by mouth as needed.     Marland Kitchen glucose blood (ONE TOUCH ULTRA TEST) test strip     . hydrOXYzine (ATARAX/VISTARIL) 10 MG tablet TAKE 1 TABLET(10 MG) BY MOUTH THREE TIMES DAILY AS NEEDED 90 tablet 0  . lactulose (CHRONULAC) 10 GM/15ML solution     . lidocaine-prilocaine (EMLA) cream APPLY TOPICALLY AS NEEDED. 30 g 0  . lisinopril (ZESTRIL) 10 MG tablet Take 10 mg by mouth once.    . metFORMIN (GLUCOPHAGE-XR) 500 MG 24 hr tablet Take 1 tablet (500 mg total) by mouth daily with breakfast. 90 tablet 1  . Misc Natural Products (OSTEO BI-FLEX ADV TRIPLE ST PO) Take 2 tablets by mouth daily.    . montelukast (SINGULAIR) 10 MG tablet Take 1 tablet by mouth on the day before treatment and for 2 days after treatment. 30 tablet 0  . Multiple Vitamins-Minerals (HAIR/SKIN/NAILS/BIOTIN PO) Take 1 capsule by mouth 3 (three) times daily.    . Omega 3-6-9 Fatty Acids (OMEGA 3-6-9 COMPLEX) CAPS Take by mouth.    Marland Kitchen omeprazole (PRILOSEC) 40 MG capsule TAKE 1 CAPSULE EVERY DAY 90 capsule 3  . tiZANidine (ZANAFLEX) 4 MG tablet Take 1 tablet (4 mg total) by mouth every 8 (eight) hours as needed for muscle spasms. 270 tablet 1  . traZODone (DESYREL) 100 MG tablet TAKE 1 TABLET AT BEDTIME  FOR  SLEEP 90 tablet 1  . dexamethasone (DECADRON) 4 MG tablet Take 5 tablets (69m) on days 2, 9, 16, 23 of cycle 1 and 2 Take 5 tablets (259m on days 2, 16 of cycle 3,4,5 and 6 Take 5 tablets (2069mon days 2 of cycles 7 and beyond (Patient not taking: Reported on 01/13/2020) 120 tablet 0  . diphenoxylate-atropine  (LOMOTIL)  2.5-0.025 MG tablet Take 2 tablets by mouth daily as needed for diarrhea or loose stools. (Patient not taking: Reported on 01/10/2020) 45 tablet 2  . fluconazole (DIFLUCAN) 150 MG tablet Take 1 tablet (150 mg total) by mouth daily. (Patient not taking: Reported on 12/29/2019) 1 tablet 0  . NARCAN 4 MG/0.1ML LIQD nasal spray kit 1 spray.  (Patient not taking: Reported on 01/10/2020)    . pomalidomide (POMALYST) 3 MG capsule TAKE 1 CAPSULE BY MOUTH EVERY DAY   Rem's # [id number] Date obtained: 01/05/2020 21 capsule 0  . promethazine (PHENERGAN) 25 MG tablet Take 1 tablet (25 mg total) by mouth every 6 (six) hours as needed for nausea or vomiting. (Patient not taking: Reported on 01/10/2020) 90 tablet 0  . promethazine-dextromethorphan (PROMETHAZINE-DM) 6.25-15 MG/5ML syrup Take 5 mLs by mouth 4 (four) times daily as needed for cough. (Patient not taking: Reported on 01/10/2020) 240 mL 0   No current facility-administered medications for this visit.    Review of Systems  Constitutional: Positive for malaise/fatigue. Negative for chills, diaphoresis, fever and weight loss (stable).       She is "haning in there".  HENT: Negative for congestion, ear discharge, ear pain, hearing loss, nosebleeds, sinus pain, sore throat and tinnitus.   Eyes: Negative.  Negative for blurred vision.  Respiratory: Negative for cough, hemoptysis, sputum production, shortness of breath and wheezing.   Cardiovascular: Negative.  Negative for chest pain, palpitations and leg swelling.  Gastrointestinal: Positive for nausea (chronic s/p gallbladder surgery, feels like getting worse). Negative for abdominal pain, blood in stool, constipation (at times), diarrhea (occasional), heartburn, melena and vomiting.       Drinking protein shakes.  Genitourinary: Negative for dysuria, frequency, hematuria and urgency.  Musculoskeletal: Negative for back pain (chronic; paraspinal tenderness; upper back), falls, joint pain, myalgias  and neck pain.  Skin: Negative for itching and rash.  Neurological: Positive for sensory change (chronic numbness in fingers and toes, stable) and weakness. Negative for dizziness, tingling and headaches.  Endo/Heme/Allergies: Negative.  Negative for environmental allergies. Does not bruise/bleed easily.       Diabetes, under good control.  Psychiatric/Behavioral: Negative.  Negative for depression and memory loss. The patient is not nervous/anxious and does not have insomnia.   All other systems reviewed and are negative.   Performance status (ECOG): 1  Vitals Blood pressure 135/66, pulse 92, temperature (!) 97.2 F (36.2 C), temperature source Tympanic, weight 141 lb 1.5 oz (64 kg), SpO2 100 %.  Physical Exam Vitals and nursing note reviewed.  Constitutional:      General: She is not in acute distress.    Appearance: She is well-developed. She is not diaphoretic.     Interventions: Face mask in place.  HENT:     Head: Normocephalic and atraumatic.     Comments: Short curly blonde hair.    Mouth/Throat:     Mouth: Mucous membranes are moist.     Pharynx: Oropharynx is clear.  Eyes:     General: No scleral icterus.    Conjunctiva/sclera: Conjunctivae normal.     Pupils: Pupils are equal, round, and reactive to light.     Comments: Glasses. Brown eyes.   Cardiovascular:     Rate and Rhythm: Normal rate and regular rhythm.     Pulses: Normal pulses.     Heart sounds: Normal heart sounds. No murmur heard.   Pulmonary:     [place]: Pulmonary effort is normal. No respiratory distress.  Breath sounds: Normal breath sounds. No wheezing or rales.  Chest:     Chest wall: No tenderness.  Abdominal:     General: Bowel sounds are normal. There is no distension.     Palpations: Abdomen is soft. There is no mass.     Tenderness: There is no abdominal tenderness. There is no guarding or rebound.  Musculoskeletal:        General: Tenderness (BLE) present. No swelling. Normal range of  motion.     Cervical back: Normal range of motion and neck supple.  Lymphadenopathy:     Head:     Right side of head: No preauricular, posterior auricular or occipital adenopathy.     Left side of head: No preauricular, posterior auricular or occipital adenopathy.     Cervical: No cervical adenopathy.     Upper Body:     Right upper body: No supraclavicular or axillary adenopathy.     Left upper body: No supraclavicular or axillary adenopathy.     Lower Body: No right inguinal adenopathy. No left inguinal adenopathy.  Skin:    General: Skin is warm and dry.  Neurological:     Mental Status: She is alert and oriented to person, place, and time. Mental status is at baseline.  Psychiatric:        Mood and Affect: Mood normal.        Behavior: Behavior normal.        Thought Content: Thought content normal.        Judgment: Judgment normal.    Infusion on 01/17/2020  Component Date Value Ref Range Status  . WBC 01/17/2020 4.2  4.0 - 10.5 K/uL Final  . RBC 01/17/2020 3.35* 3.87 - 5.11 MIL/uL Final  . Hemoglobin 01/17/2020 10.5* 12.0 - 15.0 g/dL Final  . HCT 01/17/2020 30.7* 36 - 46 % Final  . MCV 01/17/2020 91.6  80.0 - 100.0 fL Final  . MCH 01/17/2020 31.3  26.0 - 34.0 pg Final  . MCHC 01/17/2020 34.2  30.0 - 36.0 g/dL Final  . RDW 01/17/2020 12.5  11.5 - 15.5 % Final  . Platelets 01/17/2020 133* 150 - 400 K/uL Final  . nRBC 01/17/2020 0.0  0.0 - 0.2 % Final  . Neutrophils Relative % 01/17/2020 71  % Final  . Neutro Abs 01/17/2020 3.0  1.7 - 7.7 K/uL Final  . Lymphocytes Relative 01/17/2020 18  % Final  . Lymphs Abs 01/17/2020 0.7  0.7 - 4.0 K/uL Final  . Monocytes Relative 01/17/2020 6  % Final  . Monocytes Absolute 01/17/2020 0.3  0 - 1 K/uL Final  . Eosinophils Relative 01/17/2020 4  % Final  . Eosinophils Absolute 01/17/2020 0.2  0 - 0 K/uL Final  . Basophils Relative 01/17/2020 1  % Final  . Basophils Absolute 01/17/2020 0.1  0 - 0 K/uL Final  . Immature Granulocytes  01/17/2020 0  % Final  . Abs Immature Granulocytes 01/17/2020 0.01  0.00 - 0.07 K/uL Final   Performed at [facility], [address], [place], [place] [address]    Assessment:  Sarah [name] is a 64 y.o. 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 Bone marrow revealed 80% plasma cells. Lambda free light chains were 1340. She had nephrotic range proteinuria. She initially underwent induction with RVD. Revlimid maintenance was discontinued on 01/21/2017 secondary to intolerance.   Bone marrowaspirate andbiopsyon 01/18/2021revealed anormocellularmarrow withbut  increased lambda-restricted plasma cells (9% aspirate, 40% CD138 immunohistochemistry).Findingswereconsistent with recurrent plasma cell myeloma.Flow cytometry revealed no monoclonal B-cell or phenotypically aberrant T-cell population. Cytogeneticswere 19, XX (normal). 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  M-spikehas been followed: 0 on 04/02/2016 -06/23/2019; 0.2 on 09/02/2017, 0.1 on 11/25/2019, and 0.1 on 01/10/2020.  Lambda light chainshave been followed: 22.2 (ratio 0.56) on 07/03/2017, 30.8 (ratio 0.78) on 09/02/2017, 36.9 (ratio 0.40) on 10/21/2017, 37.4 (ratio 0.41) on 12/16/2017, 70.7(ratio 0.31) on 02/17/2018, 64.2 (ratio 0.27) on 04/07/2018, 78.9 (ratio 0.18) on 05/26/2018, 128.8 (ratio 0.17) on 08/06/2018, 181.5 (ratio 0.13) on 10/08/2018, 130.9 (ratio 0.13) on 10/20/2018, 160.7 (ratio 0.10)on 12/09/2018, 236.6 (ratio 0.07) on 02/01/2019, 363.6 (ratio 0.04) on 03/22/2019, 404.8 (ratio 0.04) on 04/05/2019, 420.7 (ratio 0.03) on 05/24/2019, 573.4 (ratio 0.03) on 06/23/2019, 451.05(ratio 0.02) on02/19/2021, 47.2 (ratio 0.13) on 10/25/2019, 22.4 (ratio 0.21) on 11/25/2019, and 16.5 (ratio 0.33) on 01/10/2020  24 hour UPEPon 06/03/2019 revealed kappa free light  chains95.76,lambda free light chains1,260.71, andratio 0.08.24 hourUPEPon 02/22/2021revealed totalproteinof715m/24 hrs withlambdafree light chains 1,084.158mL andratioof0.10(1.03-31.76). M spike inurinewas46.1%(36153m4 hrs).  Bone surveyon 04/08/2016 and11/28/2018 revealed no definite lytic lesion seen in the visualized skeleton.Bone surveyon 11/19/2018 revealed no suspicious lucent lesionsand no acute bony abnormality.PET scanon 07/12/2019 revealed no focal metabolic activity to suggest active myeloma within the skeleton. There wereno lytic lesions identified on the CT portion of the examorsoft tissue plasmacytomas. There was no evidence of multiple myeloma.  Pretreatment RBC phenotypeon 09/23/2019 waspositivefor C, e, DUFFY B, KIDD B, M, S, and s antigen; negativefor c, E, KELL, DUFFY A, KIDD A, and N antigen.  Sheis day 1 ofcycle #4 daratumumab and hyaluronidase-fihj, Pomalyst, and Decadron (DPd)(09/27/2019 - 10/25/2019; 12/09/2019 - 01/10/2020). Cycle #1 was complicated by fever and neutropenia requiring admission.  Cycle #2 was complicated by pneumonia requiring admission.  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 Zometa was discontinued in 01/2017. She has chronic nauseaon Phenergan.  She has B12 deficiency. B12 was 254 on 04/09/2017, 295 on 08/20/2018, and 391 on 10/08/2018. Shewasonoral B12.She received B12 monthly (last07/15/2021).Folate was18.6on 02/08/2019.  She has iron deficiency. Ferritin was 32 on 07/01/2019. She received Venoferon 07/15/2019 and 07/22/2019.  [facility] for fever and neutropenia. Cultures were negative. CXR was negative. She received broad spectrum antibiotics and daily Granix. She received IVF for acute renal insufficiency due to diarrhea and dehydration. Creatine was 1.66 on admission and 1.12 on discharge.  She  received her second COVID-19vaccineon 10/09/2019.  Symptomatically, she is fatigued.  Neuropathy is stable.  Nausea appears to be worsening.  Exam is stable.  Plan: 1.   Labs today: CBC with diff, CMP, Mg. 2. Stage III multiple myeloma Clinically, she is doing fairly well. Lambda free light chains have improved from451.05on02/19/2021.   After cycle #1:  47.2.   After cycle #2:  22.4.   After cycle #3:  16.5. She is day8of cycle #4 daratumumab plus Pomalyst and dexamethasone (DPd). She began cycle #4 on 01/10/2020   Neuropathy remains stable.                        Course has been complicated by fatigue and low counts.             Reviewed labs.  Day 8 of cycle #4 daratumumab SQ today.   Discuss plans for 2 weeks on and 1 week off of Pomalyst with each cycle.  Continue Decadron.  Treatment plan:                          Pomalidomide (Pomalyst) 3 mg po q day 1-21.                         Decadron 20 mg po day 1-2, day 8-9, 15-16, and 22-23 with cycle #1 and 2. Decadron 20 mg po on days 1-2 and 15-16 x 4 cycles (if needed). Daratumumab and hyaluronidase-fihj (Darzalez Faspro) 1800 mg SQ on days 1, 8, 15, and 22. Premeds for [employer]:  Singulair 10 mg po day before, day of, and 2 days. Ondansetron 8 mg po,Benadryl 50 mg po, andTylenol 650 mg po.            Patient took premeds of Singulair, Tylenol and Benadryl.            Discuss symptom management.  She has antiemetics at home to use on a prn bases.  Interventions are adequate.   Phenergan IV as premedication on Mondays and Thursdays.      3. Normocytic anemia Hematocrit30.7. Hemoglobin10.5.  MCV91.6.Platelets133,000today. Ferritin128 on 10/04/2019. TSH was normal on02/20/2020  Creatinine 1.00(CrCl48.40m/minute). Continue to monitor. 4. Grade II peripheral neuropathy Etiology secondary to prior chemotherapy.  Neuropathy remains stable.  Continue to monitor. 5.B12 deficiency Patient receives B12 monthly (last 01/13/2020). Folate 12.1 on 01/10/2020.  Continue to monitor annually. 6. Hypomagnesemia, chronic Magnesium1.0 today. Magnesium 6 g IV today. Continue IV magnesium supplement twice weekly (Mondays and Thursdays).   7.Nausea Chronic nausea is fairly well controlled with oral magnesium.  Nausea is better controlled with IV magnesium.  Continue Phenergan IV twice weekly when patient returns for treatments. 8.   Hypogammaglobulinemia  Patient has had recurrent infections.    Discuss plans for IVIG monthly to maintain a IgG trough of 500.    Pre-Auth IVIG. 9.   Day 8 of cycle #4 daratumumab. 10.   Magnesium 6 gm IV. 11.   RTC on 01/20/2020 for labs (Mg) and IV Mg, IVIG, and IV Phenergan. 12.   RTC on 01/24/2020 for MD assessment, labs (CBC with diff, CMP, Mg) and day 15 of cycle #4   I discussed the assessment and treatment plan with the patient.  The patient was provided an opportunity to ask questions and all were answered.  The patient agreed with the plan and demonstrated an understanding of the instructions.  The patient was advised to call back if the symptoms worsen or if the condition fails to improve as anticipated.   MLequita Asal MD, PhD    01/17/2020, 9:09 AM  I, ASelena Batten am acting as scribe for [employer] CMike Gip MD, PhD.  I, Vinayak Bobier C. CMike Gip MD, have reviewed the above documentation for accuracy and completeness,  and I agree with the above.

## 2020-01-17 ENCOUNTER — Inpatient Hospital Stay (HOSPITAL_BASED_OUTPATIENT_CLINIC_OR_DEPARTMENT_OTHER): Payer: Medicare PPO | Admitting: Hematology and Oncology

## 2020-01-17 ENCOUNTER — Other Ambulatory Visit: Payer: Self-pay

## 2020-01-17 ENCOUNTER — Inpatient Hospital Stay: Payer: Medicare PPO

## 2020-01-17 ENCOUNTER — Other Ambulatory Visit: Payer: Self-pay | Admitting: *Deleted

## 2020-01-17 ENCOUNTER — Encounter: Payer: Self-pay | Admitting: Hematology and Oncology

## 2020-01-17 VITALS — BP 135/66 | HR 92 | Temp 97.2°F | Wt 141.1 lb

## 2020-01-17 DIAGNOSIS — R197 Diarrhea, unspecified: Secondary | ICD-10-CM | POA: Diagnosis not present

## 2020-01-17 DIAGNOSIS — G620 Drug-induced polyneuropathy: Secondary | ICD-10-CM

## 2020-01-17 DIAGNOSIS — Z5112 Encounter for antineoplastic immunotherapy: Secondary | ICD-10-CM

## 2020-01-17 DIAGNOSIS — C9000 Multiple myeloma not having achieved remission: Secondary | ICD-10-CM | POA: Diagnosis not present

## 2020-01-17 DIAGNOSIS — K5900 Constipation, unspecified: Secondary | ICD-10-CM | POA: Diagnosis not present

## 2020-01-17 DIAGNOSIS — Z7189 Other specified counseling: Secondary | ICD-10-CM

## 2020-01-17 DIAGNOSIS — N183 Chronic kidney disease, stage 3 unspecified: Secondary | ICD-10-CM | POA: Diagnosis not present

## 2020-01-17 DIAGNOSIS — T451X5A Adverse effect of antineoplastic and immunosuppressive drugs, initial encounter: Secondary | ICD-10-CM | POA: Diagnosis not present

## 2020-01-17 DIAGNOSIS — R110 Nausea: Secondary | ICD-10-CM | POA: Diagnosis not present

## 2020-01-17 DIAGNOSIS — D801 Nonfamilial hypogammaglobulinemia: Secondary | ICD-10-CM | POA: Diagnosis not present

## 2020-01-17 DIAGNOSIS — D709 Neutropenia, unspecified: Secondary | ICD-10-CM | POA: Diagnosis not present

## 2020-01-17 DIAGNOSIS — I129 Hypertensive chronic kidney disease with stage 1 through stage 4 chronic kidney disease, or unspecified chronic kidney disease: Secondary | ICD-10-CM | POA: Diagnosis not present

## 2020-01-17 DIAGNOSIS — E538 Deficiency of other specified B group vitamins: Secondary | ICD-10-CM | POA: Diagnosis not present

## 2020-01-17 LAB — COMPREHENSIVE METABOLIC PANEL
ALT: 27 U/L (ref 0–44)
AST: 23 U/L (ref 15–41)
Albumin: 4.1 g/dL (ref 3.5–5.0)
Alkaline Phosphatase: 78 U/L (ref 38–126)
Anion gap: 11 (ref 5–15)
BUN: 19 mg/dL (ref 8–23)
CO2: 24 mmol/L (ref 22–32)
Calcium: 8.7 mg/dL — ABNORMAL LOW (ref 8.9–10.3)
Chloride: 102 mmol/L (ref 98–111)
Creatinine, Ser: 1.04 mg/dL — ABNORMAL HIGH (ref 0.44–1.00)
GFR calc Af Amer: >60 mL/min (ref >60)
GFR calc non Af Amer: 57 mL/min — ABNORMAL LOW (ref >60)
Glucose, Bld: 166 mg/dL — ABNORMAL HIGH (ref 70–99)
Potassium: 3.7 mmol/L (ref 3.5–5.1)
Sodium: 137 mmol/L (ref 135–145)
Total Bilirubin: 0.5 mg/dL (ref 0.3–1.2)
Total Protein: 6.8 g/dL (ref 6.5–8.1)

## 2020-01-17 LAB — CBC WITH DIFFERENTIAL/PLATELET
Abs Immature Granulocytes: 0.01 10*3/uL (ref 0.00–0.07)
Basophils Absolute: 0.1 10*3/uL (ref 0.0–0.1)
Basophils Relative: 1 %
Eosinophils Absolute: 0.2 10*3/uL (ref 0.0–0.5)
Eosinophils Relative: 4 %
HCT: 30.7 % — ABNORMAL LOW (ref 36.0–46.0)
Hemoglobin: 10.5 g/dL — ABNORMAL LOW (ref 12.0–15.0)
Immature Granulocytes: 0 %
Lymphocytes Relative: 18 %
Lymphs Abs: 0.7 10*3/uL (ref 0.7–4.0)
MCH: 31.3 pg (ref 26.0–34.0)
MCHC: 34.2 g/dL (ref 30.0–36.0)
MCV: 91.6 fL (ref 80.0–100.0)
Monocytes Absolute: 0.3 10*3/uL (ref 0.1–1.0)
Monocytes Relative: 6 %
Neutro Abs: 3 10*3/uL (ref 1.7–7.7)
Neutrophils Relative %: 71 %
Platelets: 133 10*3/uL — ABNORMAL LOW (ref 150–400)
RBC: 3.35 MIL/uL — ABNORMAL LOW (ref 3.87–5.11)
RDW: 12.5 % (ref 11.5–15.5)
WBC: 4.2 10*3/uL (ref 4.0–10.5)
nRBC: 0 % (ref 0.0–0.2)

## 2020-01-17 LAB — MAGNESIUM: Magnesium: 1 mg/dL — ABNORMAL LOW (ref 1.7–2.4)

## 2020-01-17 MED ORDER — SODIUM CHLORIDE 0.9 % IV SOLN
Freq: Once | INTRAVENOUS | Status: AC
  Filled 2020-01-17: qty 250

## 2020-01-17 MED ORDER — PROMETHAZINE HCL 25 MG/ML IJ SOLN
25.0000 mg | Freq: Once | INTRAMUSCULAR | Status: AC
  Administered 2020-01-17: 25 mg via INTRAVENOUS
  Filled 2020-01-17: qty 1

## 2020-01-17 MED ORDER — SODIUM CHLORIDE 0.9 % IV SOLN
6.0000 g | Freq: Once | INTRAVENOUS | Status: AC
  Administered 2020-01-17: 6 g via INTRAVENOUS
  Filled 2020-01-17: qty 12

## 2020-01-17 MED ORDER — DARATUMUMAB-HYALURONIDASE-FIHJ 1800-30000 MG-UT/15ML ~~LOC~~ SOLN
1800.0000 mg | Freq: Once | SUBCUTANEOUS | Status: AC
  Administered 2020-01-17: 1800 mg via SUBCUTANEOUS
  Filled 2020-01-17: qty 15

## 2020-01-17 MED ORDER — DEXAMETHASONE 4 MG PO TABS
20.0000 mg | ORAL_TABLET | Freq: Once | ORAL | Status: AC
  Administered 2020-01-17: 20 mg via ORAL
  Filled 2020-01-17: qty 5

## 2020-01-17 MED ORDER — HEPARIN SOD (PORK) LOCK FLUSH 100 UNIT/ML IV SOLN
500.0000 [IU] | Freq: Once | INTRAVENOUS | Status: AC | PRN
  Administered 2020-01-17: 500 [IU]
  Filled 2020-01-17: qty 5

## 2020-01-19 ENCOUNTER — Other Ambulatory Visit: Payer: Self-pay | Admitting: *Deleted

## 2020-01-20 ENCOUNTER — Other Ambulatory Visit: Payer: Self-pay | Admitting: Hematology and Oncology

## 2020-01-20 ENCOUNTER — Inpatient Hospital Stay: Payer: Medicare PPO

## 2020-01-20 ENCOUNTER — Other Ambulatory Visit: Payer: Self-pay

## 2020-01-20 VITALS — BP 105/69 | HR 93 | Temp 97.6°F | Resp 18

## 2020-01-20 DIAGNOSIS — R110 Nausea: Secondary | ICD-10-CM | POA: Diagnosis not present

## 2020-01-20 DIAGNOSIS — R197 Diarrhea, unspecified: Secondary | ICD-10-CM | POA: Diagnosis not present

## 2020-01-20 DIAGNOSIS — E538 Deficiency of other specified B group vitamins: Secondary | ICD-10-CM | POA: Diagnosis not present

## 2020-01-20 DIAGNOSIS — D801 Nonfamilial hypogammaglobulinemia: Secondary | ICD-10-CM | POA: Diagnosis not present

## 2020-01-20 DIAGNOSIS — C9000 Multiple myeloma not having achieved remission: Secondary | ICD-10-CM

## 2020-01-20 DIAGNOSIS — N183 Chronic kidney disease, stage 3 unspecified: Secondary | ICD-10-CM | POA: Diagnosis not present

## 2020-01-20 DIAGNOSIS — I129 Hypertensive chronic kidney disease with stage 1 through stage 4 chronic kidney disease, or unspecified chronic kidney disease: Secondary | ICD-10-CM | POA: Diagnosis not present

## 2020-01-20 DIAGNOSIS — D709 Neutropenia, unspecified: Secondary | ICD-10-CM | POA: Diagnosis not present

## 2020-01-20 DIAGNOSIS — K5900 Constipation, unspecified: Secondary | ICD-10-CM | POA: Diagnosis not present

## 2020-01-20 LAB — COMPREHENSIVE METABOLIC PANEL
ALT: 26 U/L (ref 0–44)
AST: 22 U/L (ref 15–41)
Albumin: 3.9 g/dL (ref 3.5–5.0)
Alkaline Phosphatase: 70 U/L (ref 38–126)
Anion gap: 10 (ref 5–15)
BUN: 24 mg/dL — ABNORMAL HIGH (ref 8–23)
CO2: 22 mmol/L (ref 22–32)
Calcium: 8.7 mg/dL — ABNORMAL LOW (ref 8.9–10.3)
Chloride: 105 mmol/L (ref 98–111)
Creatinine, Ser: 1.12 mg/dL — ABNORMAL HIGH (ref 0.44–1.00)
GFR calc Af Amer: >60 mL/min (ref >60)
GFR calc non Af Amer: 52 mL/min — ABNORMAL LOW (ref >60)
Glucose, Bld: 112 mg/dL — ABNORMAL HIGH (ref 70–99)
Potassium: 3.8 mmol/L (ref 3.5–5.1)
Sodium: 137 mmol/L (ref 135–145)
Total Bilirubin: 0.5 mg/dL (ref 0.3–1.2)
Total Protein: 6.5 g/dL (ref 6.5–8.1)

## 2020-01-20 LAB — MAGNESIUM: Magnesium: 1.2 mg/dL — ABNORMAL LOW (ref 1.7–2.4)

## 2020-01-20 MED ORDER — HEPARIN SOD (PORK) LOCK FLUSH 100 UNIT/ML IV SOLN
500.0000 [IU] | Freq: Once | INTRAVENOUS | Status: AC | PRN
  Administered 2020-01-20: 500 [IU]
  Filled 2020-01-20: qty 5

## 2020-01-20 MED ORDER — DEXTROSE 5 % IV SOLN
Freq: Once | INTRAVENOUS | Status: AC
  Filled 2020-01-20: qty 250

## 2020-01-20 MED ORDER — IMMUNE GLOBULIN (HUMAN) 10 GM/100ML IV SOLN
400.0000 mg/kg | Freq: Once | INTRAVENOUS | Status: AC
  Administered 2020-01-20: 25 g via INTRAVENOUS
  Filled 2020-01-20: qty 200

## 2020-01-20 MED ORDER — SODIUM CHLORIDE 0.9 % IV SOLN
INTRAVENOUS | Status: DC
  Filled 2020-01-20: qty 250

## 2020-01-20 MED ORDER — SODIUM CHLORIDE 0.9 % IV SOLN
6.0000 g | Freq: Once | INTRAVENOUS | Status: AC
  Administered 2020-01-20: 6 g via INTRAVENOUS
  Filled 2020-01-20: qty 12

## 2020-01-20 MED ORDER — DIPHENHYDRAMINE HCL 25 MG PO CAPS
ORAL_CAPSULE | ORAL | Status: AC
  Filled 2020-01-20: qty 1

## 2020-01-20 MED ORDER — DIPHENHYDRAMINE HCL 25 MG PO TABS
25.0000 mg | ORAL_TABLET | Freq: Once | ORAL | Status: AC
  Administered 2020-01-20: 25 mg via ORAL
  Filled 2020-01-20: qty 1

## 2020-01-20 MED ORDER — ACETAMINOPHEN 325 MG PO TABS
650.0000 mg | ORAL_TABLET | Freq: Once | ORAL | Status: AC
  Administered 2020-01-20: 650 mg via ORAL
  Filled 2020-01-20: qty 2

## 2020-01-20 MED ORDER — PROMETHAZINE HCL 25 MG/ML IJ SOLN
25.0000 mg | Freq: Once | INTRAMUSCULAR | Status: AC
  Administered 2020-01-20: 25 mg via INTRAVENOUS
  Filled 2020-01-20: qty 1

## 2020-01-20 NOTE — Progress Notes (Signed)
[facility]  [address] [place], [place] [address] Phone: [phone]  Fax: [phone]   Clinic Day:  01/24/2020  Referring physician: Glean Hess, MD  Chief Complaint: Sarah [name] is a 64 y.o. 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   HPI: The patient was last seen in the medical oncology clinic on 01/17/2020. At that time, she was fatigued.  Neuropathy was stable.  Nausea appeared to be worsening.  Exam was stable. Hematocrit was 30.7, hemoglobin 10.5, MCV 91.6, platelets 133,000, WBC 4,200. Creatinine was 1.04. CrCl was 57 ml/min. Calcium was 8.7.  Magnesium was 1.0. She received daratumumab and 6 g IV magnesium.    Magnesium was 1.2 on 01/20/2020. She received 6 g IV magnesium.  She received 400 mg/kg IVIG.  During the interim, she has been "okay I guess." The patient has been experiencing abdominal pain, nausea, and heartburn. She has also been congested. There was one day during the interim that she had multiple episodes of vomiting. She needs a refill of her Phenergan. The patient reports night sweats; she sometimes wake up in the middle of the night soaking wet.   Creatinine is 2.11 today.  The patient has been drinking a lot of water because she is always thirsty. There have been no changes in her urine output or color. She denies any new medications or herbal products. She finished her Pomalyst yesterday.  Her only new medication during the interim was IVIG.  She denies any problems while receiving IVIG.  She sees Dr. Holley Raring tomorrow afternoon.   Past Medical History:  Diagnosis Date  . Abnormal stress test 02/14/2016   Overview:  Added automatically from request for surgery [id number]  . Anemia   . Anxiety   . Arthritis   . Bicuspid aortic valve   . CHF (congestive heart failure) ([place])   . CKD  (chronic kidney disease) stage 3, GFR 30-59 ml/min   . Depression   . Diabetes mellitus ([place])   . Dizziness   . Fatty liver   . Frequent falls   . GERD (gastroesophageal reflux disease)   . Gout   . Heart murmur   . History of blood transfusion   . History of bone marrow transplant ([place])   . History of uterine fibroid   . Hx of cardiac catheterization 06/05/2016   Overview:  Normal coronaries 2017  . Hypertension   . Hypomagnesemia   . Multiple myeloma ([place])   . Personal history of chemotherapy   . Renal cyst     Past Surgical History:  Procedure Laterality Date  . ABDOMINAL HYSTERECTOMY    . 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 CARDIAC ELECTROPHYSIOLOGY MAPPING AND ABLATION    . CARPAL TUNNEL RELEASE Bilateral   . CHOLECYSTECTOMY  2008  . COLONOSCOPY WITH PROPOFOL N/A 05/07/2017   Procedure: COLONOSCOPY WITH PROPOFOL;  Surgeon: [name], MD;  Location: [facility] ENDOSCOPY;  Service: Gastroenterology;  Laterality: N/A;  . ESOPHAGOGASTRODUODENOSCOPY (EGD) WITH PROPOFOL N/A 05/07/2017   Procedure: ESOPHAGOGASTRODUODENOSCOPY (EGD) WITH PROPOFOL;  Surgeon: [name], MD;  Location: [facility] ENDOSCOPY;  Service: Gastroenterology;  Laterality: N/A;  . FOOT SURGERY Bilateral   . INCONTINENCE SURGERY  2009  . INTERSTIM IMPLANT PLACEMENT    . other     over active bladder  . OTHER SURGICAL HISTORY     bladder stimulator   .  PARTIAL HYSTERECTOMY  03/1996   fibroids  . PORTA CATH INSERTION N/A 03/10/2019   Procedure: PORTA CATH INSERTION;  Surgeon: Algernon Huxley, MD;  Location: [facility] LAB;  Service: Cardiovascular;  Laterality: N/A;  . TONSILLECTOMY  2007    Family History  Problem Relation Age of Onset  . Colon cancer Father   . Renal Disease Father   . Diabetes Mellitus II Father   . Melanoma Paternal Grandmother   . Breast cancer Maternal Aunt 39  . Anemia Mother   . Heart disease Mother   . Heart failure Mother   . Renal Disease Mother   . Congestive Heart Failure  Mother   . Heart disease Maternal Uncle   . Throat cancer Maternal Uncle   . Lung cancer Maternal Uncle   . Liver disease Maternal Uncle   . Heart failure Maternal Uncle   . Hearing loss Son 28       Suicide     Social History:  reports that she quit smoking about 28 years ago. Her smoking use included cigarettes. She has a 20.00 pack-year smoking history. She has never used smokeless tobacco. She reports previous alcohol use. She reports that she does not use drugs. Patient has not had ETOH in several months. She is on disability. She notes exposure to perchloroethylene [facility]).She lives much of her adult life in [place]. She was married with 2 sons. Her husband passed away. Her 2 sons took their own lives (age 12 and 35). She worked in [employer] in [employer]. She went back to school and earned an [facility] and works for [employer] for several years.[place] sistersin [place]. The patient is alone today.  Allergies:  Allergies  Allergen Reactions  . Oxycodone-Acetaminophen Anaphylaxis    Swelling and rash  . Celebrex [Celecoxib] Diarrhea  . Codeine   . Plerixafor     In 2016 during ASCT collection patient developed fever to 103.9F and required hospitalization  . Benadryl [Diphenhydramine] Palpitations  . Morphine Itching and Rash  . Ondansetron Diarrhea  . Tylenol [Acetaminophen] Itching and Rash    Current Medications: Current Outpatient Medications  Medication Sig Dispense Refill  . acyclovir (ZOVIRAX) 400 MG tablet Take 1 tablet (400 mg total) by mouth 2 (two) times daily. 60 tablet 11  . allopurinol (ZYLOPRIM) 100 MG tablet TAKE 1 TABLET(100 MG) BY MOUTH DAILY as needed 90 tablet 1  . aspirin 81 MG chewable tablet Chew 1 tablet (81 mg total) by mouth daily. 30 tablet 0  . diclofenac sodium (VOLTAREN) 1 % GEL Apply 2 g topically 4 (four) times daily. 100 g 1  . fentaNYL (DURAGESIC - DOSED MCG/HR) 25 MCG/HR patch Place 25 mcg onto the skin  every 3 (three) days.     Marland Kitchen FLUoxetine (PROZAC) 40 MG capsule TAKE 1 CAPSULE EVERY DAY 90 capsule 1  . fluticasone (FLONASE) 50 MCG/ACT nasal spray Place 2 sprays into both nostrils daily. 16 g 2  . furosemide (LASIX) 20 MG tablet Take 20 mg by mouth as needed.     Marland Kitchen glucose blood (ONE TOUCH ULTRA TEST) test strip     . hydrOXYzine (ATARAX/VISTARIL) 10 MG tablet TAKE 1 TABLET(10 MG) BY MOUTH THREE TIMES DAILY AS NEEDED 90 tablet 0  . lactulose (CHRONULAC) 10 GM/15ML solution     . lisinopril (ZESTRIL) 10 MG tablet Take 10 mg by mouth once.    . Misc Natural Products (OSTEO BI-FLEX ADV TRIPLE ST PO) Take 2  tablets by mouth daily.    . Multiple Vitamins-Minerals (HAIR/SKIN/NAILS/BIOTIN PO) Take 1 capsule by mouth 3 (three) times daily.    . Omega 3-6-9 Fatty Acids (OMEGA 3-6-9 COMPLEX) CAPS Take by mouth.    Marland Kitchen omeprazole (PRILOSEC) 40 MG capsule TAKE 1 CAPSULE EVERY DAY 90 capsule 3  . tiZANidine (ZANAFLEX) 4 MG tablet Take 1 tablet (4 mg total) by mouth every 8 (eight) hours as needed for muscle spasms. 270 tablet 1  . acetaminophen (TYLENOL) 500 MG tablet Take 500 mg by mouth every 6 (six) hours as needed. (Patient not taking: Reported on 01/24/2020)    . dexamethasone (DECADRON) 4 MG tablet Take 5 tablets (18m) on days 2, 9, 16, 23 of cycle 1 and 2 Take 5 tablets (236m on days 2, 16 of cycle 3,4,5 and 6 Take 5 tablets (2042mon days 2 of cycles 7 and beyond 120 tablet 0  . diphenhydrAMINE (BENADRYL) 50 MG capsule Take 50 mg by mouth as needed.     . diphenoxylate-atropine (LOMOTIL) 2.5-0.025 MG tablet Take 2 tablets by mouth daily as needed for diarrhea or loose stools. 45 tablet 2  . fexofenadine (ALLEGRA) 180 MG tablet TAKE 1 TABLET(180 MG) BY MOUTH DAILY 30 tablet 5  . fluconazole (DIFLUCAN) 150 MG tablet Take 1 tablet (150 mg total) by mouth daily. 1 tablet 0  . lidocaine-prilocaine (EMLA) cream APPLY TOPICALLY AS NEEDED. 30 g 0  . metFORMIN (GLUCOPHAGE-XR) 500 MG 24 hr tablet TAKE 1  TABLET (500 MG TOTAL) BY MOUTH DAILY WITH BREAKFAST. 90 tablet 1  . montelukast (SINGULAIR) 10 MG tablet Take 1 tablet by mouth on the day before treatment and for 2 days after treatment. 30 tablet 0  . NARCAN 4 MG/0.1ML LIQD nasal spray kit 1 spray.     . pomalidomide (POMALYST) 3 MG capsule TAKE 1 CAPSULE BY MOUTH EVERY DAY   Rem's # [id number] obtained: 01/05/2020 21 capsule 0  . promethazine (PHENERGAN) 25 MG tablet Take 1 tablet (25 mg total) by mouth every 6 (six) hours as needed for nausea or vomiting. 90 tablet 0  . promethazine-dextromethorphan (PROMETHAZINE-DM) 6.25-15 MG/5ML syrup Take 5 mLs by mouth 4 (four) times daily as needed for cough. 240 mL 0  . traZODone (DESYREL) 100 MG tablet TAKE 1 TABLET AT BEDTIME  FOR  SLEEP 90 tablet 0   No current facility-administered medications for this visit.   Facility-Administered Medications Ordered in Other Visits  Medication Dose Route Frequency Provider Last Rate Last Admin  . 0.9 %  sodium chloride infusion   Intravenous Continuous CorLequita AsalD   Stopped at 01/20/20 1232    Review of Systems  Constitutional: Positive for diaphoresis (night sweats) and malaise/fatigue. Negative for chills, fever and weight loss (stable).       Doing "okay I guess".  HENT: Positive for congestion. Negative for ear discharge, ear pain, hearing loss, nosebleeds, sinus pain, sore throat and tinnitus.   Eyes: Negative.  Negative for blurred vision.  Respiratory: Negative for cough, hemoptysis, sputum production, shortness of breath and wheezing.   Cardiovascular: Negative.  Negative for chest pain, palpitations and leg swelling.  Gastrointestinal: Positive for abdominal pain, heartburn (on Prilosec), nausea (chronic, feels worse "bad bad") and vomiting. Negative for blood in stool, constipation, diarrhea and melena.       Drinking lots of water, always thirsty.  Genitourinary: Negative for dysuria, frequency, hematuria and urgency.    Musculoskeletal: Negative for back pain (chronic; paraspinal tenderness; upper back),  falls, joint pain, myalgias and neck pain.  Skin: Negative for itching and rash.  Neurological: Positive for weakness. Negative for dizziness, tingling, sensory change (chronic numbness in fingers and toes, stable) and headaches.  Endo/Heme/Allergies: Negative.  Negative for environmental allergies. Does not bruise/bleed easily.       Diabetes, under good control.  Psychiatric/Behavioral: Negative.  Negative for depression and memory loss. The patient is not nervous/anxious and does not have insomnia.   All other systems reviewed and are negative.   Performance status (ECOG): 1  Vitals Blood pressure (!) 90/58, pulse 93, temperature (!) 97 F (36.1 C), temperature source Tympanic, resp. rate 16, weight 141 lb 10.3 oz (64.3 kg), SpO2 100 %.  Physical Exam Vitals and nursing note reviewed.  Constitutional:      General: She is not in acute distress.    Appearance: She is well-developed. She is not diaphoretic.     Interventions: Face mask in place.  HENT:     Head: Normocephalic and atraumatic.     Comments: Short curly blonde hair.    Mouth/Throat:     Mouth: Mucous membranes are dry.     Pharynx: Oropharynx is clear.  Eyes:     General: No scleral icterus.    Conjunctiva/sclera: Conjunctivae normal.     Pupils: Pupils are equal, round, and reactive to light.     Comments: Glasses. Brown eyes.   Cardiovascular:     Rate and Rhythm: Normal rate and regular rhythm.     Pulses: Normal pulses.     Heart sounds: Normal heart sounds. No murmur heard.   Pulmonary:     Effort: Pulmonary effort is normal. No respiratory distress.     Breath sounds: Normal breath sounds. No wheezing or rales.  Chest:     Chest wall: No tenderness.  Abdominal:     General: Bowel sounds are normal. There is no distension.     Palpations: Abdomen is soft. There is no mass.     Tenderness: There is abdominal  tenderness. There is left CVA tenderness (mild). There is no guarding or rebound.  Musculoskeletal:        General: Tenderness (ankles) present. No swelling. Normal range of motion.     Cervical back: Normal range of motion and neck supple.  Lymphadenopathy:     Head:     Right side of head: No preauricular, posterior auricular or occipital adenopathy.     Left side of head: No preauricular, posterior auricular or occipital adenopathy.     Cervical: No cervical adenopathy.     Upper Body:     Right upper body: No supraclavicular or axillary adenopathy.     Left upper body: No supraclavicular or axillary adenopathy.     Lower Body: No right inguinal adenopathy. No left inguinal adenopathy.  Skin:    General: Skin is warm and dry.  Neurological:     Mental Status: She is alert and oriented to person, place, and time. Mental status is at baseline.  Psychiatric:        Mood and Affect: Mood normal.        Behavior: Behavior normal.        Thought Content: Thought content normal.        Judgment: Judgment normal.    Infusion on 01/24/2020  Component Date Value Ref Range Status  . Magnesium 01/24/2020 1.1* 1.7 - 2.4 mg/dL Final   Performed at [facility], [address] [place],  [address]  .  Sodium 01/24/2020 135  135 - 145 mmol/L Final  . Potassium 01/24/2020 4.4  3.5 - 5.1 mmol/L Final  . Chloride 01/24/2020 101  98 - 111 mmol/L Final  . CO2 01/24/2020 23  22 - 32 mmol/L Final  . Glucose, Bld 01/24/2020 149* 70 - 99 mg/dL Final   Glucose reference range applies only to samples taken after fasting for at least 8 hours.  . BUN 01/24/2020 41* 8 - 23 mg/dL Final  . Creatinine, Ser 01/24/2020 2.11* 0.44 - 1.00 mg/dL Final  . Calcium 01/24/2020 8.9  8.9 - 10.3 mg/dL Final  . Total Protein 01/24/2020 7.1  6.5 - 8.1 g/dL Final  . Albumin 01/24/2020 4.1  3.5 - 5.0 g/dL Final  . AST 01/24/2020 33  15 - 41 U/L Final  . ALT 01/24/2020 38  0 - 44 U/L Final  . Alkaline  Phosphatase 01/24/2020 84  38 - 126 U/L Final  . Total Bilirubin 01/24/2020 0.6  0.3 - 1.2 mg/dL Final  . GFR calc non Af Amer 01/24/2020 24* >60 mL/min Final  . GFR calc Af Amer 01/24/2020 28* >60 mL/min Final  . Anion gap 01/24/2020 11  5 - 15 Final   Performed at [facility], [address], [place], [place] [address]  . WBC 01/24/2020 4.7  4.0 - 10.5 K/uL Final  . RBC 01/24/2020 3.43* 3.87 - 5.11 MIL/uL Final  . Hemoglobin 01/24/2020 10.7* 12.0 - 15.0 g/dL Final  . HCT 01/24/2020 31.4* 36 - 46 % Final  . MCV 01/24/2020 91.5  80.0 - 100.0 fL Final  . MCH 01/24/2020 31.2  26.0 - 34.0 pg Final  . MCHC 01/24/2020 34.1  30.0 - 36.0 g/dL Final  . RDW 01/24/2020 12.8  11.5 - 15.5 % Final  . Platelets 01/24/2020 138* 150 - 400 K/uL Final  . nRBC 01/24/2020 0.0  0.0 - 0.2 % Final  . Neutrophils Relative % 01/24/2020 68  % Final  . Neutro Abs 01/24/2020 3.2  1.7 - 7.7 K/uL Final  . Lymphocytes Relative 01/24/2020 13  % Final  . Lymphs Abs 01/24/2020 0.6* 0.7 - 4.0 K/uL Final  . Monocytes Relative 01/24/2020 12  % Final  . Monocytes Absolute 01/24/2020 0.6  0 - 1 K/uL Final  . Eosinophils Relative 01/24/2020 6  % Final  . Eosinophils Absolute 01/24/2020 0.3  0 - 0 K/uL Final  . Basophils Relative 01/24/2020 1  % Final  . Basophils Absolute 01/24/2020 0.0  0 - 0 K/uL Final  . Immature Granulocytes 01/24/2020 0  % Final  . Abs Immature Granulocytes 01/24/2020 0.02  0.00 - 0.07 K/uL Final   Performed at [facility], [address], [place], [place] [address]  . Color, Urine 01/24/2020 YELLOW  YELLOW Final  . APPearance 01/24/2020 HAZY* CLEAR Final  . Specific Gravity, Urine 01/24/2020 1.020  1.005 - 1.030 Final  . pH 01/24/2020 5.0  5.0 - 8.0 Final  . Glucose, UA 01/24/2020 NEGATIVE  NEGATIVE mg/dL Final  . Hgb urine dipstick 01/24/2020 NEGATIVE  NEGATIVE Final  . Bilirubin Urine 01/24/2020 NEGATIVE  NEGATIVE Final  . Ketones, ur 01/24/2020 NEGATIVE  NEGATIVE  mg/dL Final  . Protein, ur 01/24/2020 NEGATIVE  NEGATIVE mg/dL Final  . Nitrite 01/24/2020 NEGATIVE  NEGATIVE Final  . Chalmers Guest 01/24/2020 MODERATE* NEGATIVE Final  . Squamous Epithelial / LPF 01/24/2020 0-5  0 - 5 Final  . WBC, UA 01/24/2020 11-20  0 - 5 WBC/hpf Final  . RBC /  HPF 01/24/2020 0-5  0 - 5 RBC/hpf Final  . Bacteria, UA 01/24/2020 MANY* NONE SEEN Final  . WBC Clumps 01/24/2020 PRESENT   Final  . Mucus 01/24/2020 PRESENT   Final  . Hyaline Casts, UA 01/24/2020 PRESENT   Final   Performed at [facility], [address] [place], [place] [address]    Assessment:  Sarah [name] is a 64 y.o. 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 Bone marrow revealed 80% plasma cells. Lambda free light chains were 1340. She had nephrotic range proteinuria. She initially underwent induction with RVD. Revlimid maintenance was discontinued on 01/21/2017 secondary to intolerance.   Bone marrowaspirate andbiopsyon 01/18/2021revealed anormocellularmarrow withbut increased lambda-restricted plasma cells (9% aspirate, 40% CD138 immunohistochemistry).Findingswereconsistent with recurrent plasma cell myeloma.Flow cytometry revealed no monoclonal B-cell or phenotypically aberrant T-cell population. Cytogeneticswere 65, XX (normal). 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  M-spikehas been followed: 0 on 04/02/2016 -06/23/2019; 0.2 on 09/02/2017, 0.1 on 11/25/2019, and 0.1 on 01/10/2020.  Lambda light chainshave been followed: 22.2 (ratio 0.56) on 07/03/2017, 30.8 (ratio 0.78) on 09/02/2017, 36.9 (ratio 0.40) on 10/21/2017, 37.4 (ratio 0.41) on 12/16/2017, 70.7(ratio 0.31) on 02/17/2018, 64.2 (ratio 0.27) on 04/07/2018, 78.9 (ratio 0.18) on 05/26/2018, 128.8 (ratio 0.17) on 08/06/2018, 181.5 (ratio 0.13) on 10/08/2018, 130.9 (ratio 0.13) on  10/20/2018, 160.7 (ratio 0.10)on 12/09/2018, 236.6 (ratio 0.07) on 02/01/2019, 363.6 (ratio 0.04) on 03/22/2019, 404.8 (ratio 0.04) on 04/05/2019, 420.7 (ratio 0.03) on 05/24/2019, 573.4 (ratio 0.03) on 06/23/2019, 451.05(ratio 0.02) on02/19/2021, 47.2 (ratio 0.13) on 10/25/2019, 22.4 (ratio 0.21) on 11/25/2019, and 16.5 (ratio 0.33) on 01/10/2020  24 hour UPEPon 06/03/2019 revealed kappa free light chains95.76,lambda free light chains1,260.71, andratio 0.08.24 hourUPEPon 02/22/2021revealed totalproteinof753m/24 hrs withlambdafree light chains 1,084.117mL andratioof0.10(1.03-31.76). M spike inurinewas46.1%(36171m4 hrs).  Bone surveyon 04/08/2016 and11/28/2018 revealed no definite lytic lesion seen in the visualized skeleton.Bone surveyon 11/19/2018 revealed no suspicious lucent lesionsand no acute bony abnormality.PET scanon 07/12/2019 revealed no focal metabolic activity to suggest active myeloma within the skeleton. There wereno lytic lesions identified on the CT portion of the examorsoft tissue plasmacytomas. There was no evidence of multiple myeloma.  Pretreatment RBC phenotypeon 09/23/2019 waspositivefor C, e, DUFFY B, KIDD B, M, S, and s antigen; negativefor c, E, KELL, DUFFY A, KIDD A, and N antigen.  Sheis day 15 ofcycle #4 daratumumab and hyaluronidase-fihj, Pomalyst, and Decadron (DPd)(09/27/2019 - 10/25/2019; 12/09/2019 - 01/10/2020). Cycle #1 was complicated by fever and neutropenia requiring admission.  Cycle #2 was complicated by pneumonia requiring admission.  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 Zometa was discontinued in 01/2017. She has chronic nauseaon Phenergan.  She has B12 deficiency. B12 was 254 on 04/09/2017, 295 on 08/20/2018, and 391 on 10/08/2018. Shewasonoral B12.She received B12 monthly (last07/15/2021).Folate was18.6on 02/08/2019.  She has iron deficiency. Ferritin  was 32 on 07/01/2019. She received Venoferon 07/15/2019 and 07/22/2019.  She has hypogammaglobulinemia.  IgG was 245 on 11/25/2019.  She received IVIG on 01/20/2020.  [facility] for fever and neutropenia. Cultures were negative. CXR was negative. She received broad spectrum antibiotics and daily Granix. She received IVF for acute renal insufficiency due to diarrhea and dehydration. Creatine was 1.66 on admission and 1.12 on discharge.  She received her second COVID-19vaccineon 10/09/2019.  Symptomatically, she notes bad nausea and recent emesis.  Creatinine has increased from 1.04 to 2.11.  Plan:  1.   Labs today: CBC with diff, CMP, Mg. 2. Stage III multiple myeloma Clinically, she appears to be doing well. Lambda free light chains have improved from451.05on02/19/2021.   After cycle #1:  47.2.   After cycle #2:  22.4.   After cycle #3:  16.5. She is day 15of cycle #4 daratumumab plus Pomalyst and dexamethasone (DPd). She began cycle #4 on 01/10/2020   Neuropathy is stable.                        Course has been complicated by fatigue and low counts.             Review plans for 2 weeks on and 1 week off of Pomalyst with each cycle.             Treatment plan for cycles # 4 - 6 (28 day cycle):                          Pomalidomide (Pomalyst) 3 mg po q day 1-14.                         Decadron 20 mg po day 1-2 and 15-16. Daratumumab and hyaluronidase-fihj (Darzalez Faspro) 1800 mg SQ on days 1 and 15. Premeds for [employer]:  Singulair 10 mg po day before, day of, and 2 days. Phenergan 25 mg IV,Benadryl 50 mg po, andTylenol 650 mg po.  Discuss symptom management.  She has antiemetics at home to use on a prn bases.   Interventions are adequate.       3. Normocytic anemia Hematocrit31.4. Hemoglobin10.7. MCV91.5.Platelets38,000today. Ferritin51 on 01/10/2020. TSH was normal on02/20/2020  Creatinine was 1.04(CrCl48.56m/minute). Dissipate initiation of Retacrit if hematocrit < 30 and hemoglobin < 10. 4. Grade II peripheral neuropathy Etiology secondary to prior chemotherapy.  Neuropathy is stable.  Continue to monitor. 5.B12 deficiency Patient receives B12 monthly (last 01/13/2020). Folate 12.1 on 01/10/2020.  Continue to monitor annually. 6. Acute renal insufficiency   Creatinine 1.04 on 01/17/2020.  Creatinine 2.11 on 01/24/2020.  Patient denies any new medications or herbal products.  Blood pressure has been periodically low (questionable dehydration).  She has received IVIG is the only new medication.   IVIG has been associated with renal dysfunction and acute renal failure.   Per Up-to-Date, in patients at risk for renal dysfunction or acute renal failure, ensure adequate hydration prior to administration.   The dose, rate of infusion and concentration should be minimized.  STAT renal ultrasound and urinalysis today.   Discuss follow-up with Dr. LHolley Raringin nephrology. 7.   Hypomagnesemia, chronic Magnesium1.1 today. Magnesium 3 g IV today (half dose secondary to renal insufficiency). Continue IV magnesium supplement twice weekly (Mondays and Thursdays).   8.Nausea  Chronic nausea has been fairly well controlled with oral Phenergan.  Nausea has been better controlled with IV Phenergan.  Continue Phenergan IV twice weekly when patient returns for treatments. 9.   Hypogammaglobulinemia  Patient has had recurrent infections.    Patient received IVIG on  01/20/2020.  Initial plan for IVIG monthly to maintain an IgG trough of 500.   10.   No daratumumab. 11.   Urinalysis. 12.   STAT renal ultrasound. 13.   Magnesium 3 gm IV. 14.   RTC Tuesday for labs (BMP, Mg) and +/- Mg. 15.   RTC on 01/26/2020 for labs (BMP, Mg), and +/- Mg, and IV  Phenergan. 16.   RTC on 01/31/2020 for labs (CBC with diff, BMP, Mg) and +/- IV Mg. 17.   RTC on 02/07/2020 for MD assessment, labs (CBC with diff, CMP, Mg, SPEP, FLCA), and day 1 of cycle #5 daratumumab SQ and IV Mg.  I discussed the assessment and treatment plan with the patient.  The patient was provided an opportunity to ask questions and all were answered.  The patient agreed with the plan and demonstrated an understanding of the instructions.  The patient was advised to call back if the symptoms worsen or if the condition fails to improve as anticipated.   Lequita Asal, MD, PhD    01/24/2020, 1:12 PM  I, De Burrs, am acting as Education administrator for [employer]. Mike Gip, MD, PhD.  I, Carmelia Tiner C. Mike Gip, MD, have reviewed the above documentation for accuracy and completeness, and I agree with the above.

## 2020-01-21 ENCOUNTER — Encounter: Payer: Self-pay | Admitting: Hematology and Oncology

## 2020-01-21 ENCOUNTER — Telehealth: Payer: Self-pay

## 2020-01-21 NOTE — Progress Notes (Signed)
The patient c/o loose stools and lower back pain but think it because from being constipation. The patient Name and DOB has been verified by phone today.

## 2020-01-21 NOTE — Telephone Encounter (Signed)
Spoke with the patient to inform her that, just a FYI she is to take th Pomalyst 3 mg for 14 days and 14 days off. The patient was aware of the dose and expressed understanding and agreeble.

## 2020-01-24 ENCOUNTER — Inpatient Hospital Stay: Payer: Medicare PPO

## 2020-01-24 ENCOUNTER — Telehealth: Payer: Self-pay

## 2020-01-24 ENCOUNTER — Ambulatory Visit
Admission: RE | Admit: 2020-01-24 | Discharge: 2020-01-24 | Disposition: A | Discharge status: Home / Self Care | Payer: Medicare PPO | Source: Ambulatory Visit | Attending: Hematology and Oncology | Admitting: Hematology and Oncology

## 2020-01-24 ENCOUNTER — Other Ambulatory Visit: Payer: Self-pay | Admitting: Hematology and Oncology

## 2020-01-24 ENCOUNTER — Inpatient Hospital Stay (HOSPITAL_BASED_OUTPATIENT_CLINIC_OR_DEPARTMENT_OTHER): Payer: Medicare PPO | Admitting: Hematology and Oncology

## 2020-01-24 ENCOUNTER — Other Ambulatory Visit: Payer: Self-pay

## 2020-01-24 ENCOUNTER — Encounter: Payer: Self-pay | Admitting: Hematology and Oncology

## 2020-01-24 VITALS — BP 103/64 | HR 81 | Temp 96.8°F | Resp 17

## 2020-01-24 VITALS — BP 90/58 | HR 93 | Temp 97.0°F | Resp 16 | Wt 141.6 lb

## 2020-01-24 DIAGNOSIS — N179 Acute kidney failure, unspecified: Secondary | ICD-10-CM | POA: Diagnosis not present

## 2020-01-24 DIAGNOSIS — N289 Disorder of kidney and ureter, unspecified: Secondary | ICD-10-CM

## 2020-01-24 DIAGNOSIS — R110 Nausea: Secondary | ICD-10-CM

## 2020-01-24 DIAGNOSIS — T451X5A Adverse effect of antineoplastic and immunosuppressive drugs, initial encounter: Secondary | ICD-10-CM

## 2020-01-24 DIAGNOSIS — C9000 Multiple myeloma not having achieved remission: Secondary | ICD-10-CM

## 2020-01-24 DIAGNOSIS — N3943 Post-void dribbling: Secondary | ICD-10-CM | POA: Insufficient documentation

## 2020-01-24 DIAGNOSIS — R7989 Other specified abnormal findings of blood chemistry: Secondary | ICD-10-CM | POA: Diagnosis not present

## 2020-01-24 DIAGNOSIS — E538 Deficiency of other specified B group vitamins: Secondary | ICD-10-CM | POA: Diagnosis not present

## 2020-01-24 DIAGNOSIS — D801 Nonfamilial hypogammaglobulinemia: Secondary | ICD-10-CM | POA: Insufficient documentation

## 2020-01-24 DIAGNOSIS — K5900 Constipation, unspecified: Secondary | ICD-10-CM | POA: Diagnosis not present

## 2020-01-24 DIAGNOSIS — Q600 Renal agenesis, unilateral: Secondary | ICD-10-CM | POA: Diagnosis not present

## 2020-01-24 DIAGNOSIS — D709 Neutropenia, unspecified: Secondary | ICD-10-CM | POA: Diagnosis not present

## 2020-01-24 DIAGNOSIS — D649 Anemia, unspecified: Secondary | ICD-10-CM | POA: Diagnosis not present

## 2020-01-24 DIAGNOSIS — R197 Diarrhea, unspecified: Secondary | ICD-10-CM | POA: Diagnosis not present

## 2020-01-24 DIAGNOSIS — I129 Hypertensive chronic kidney disease with stage 1 through stage 4 chronic kidney disease, or unspecified chronic kidney disease: Secondary | ICD-10-CM | POA: Diagnosis not present

## 2020-01-24 DIAGNOSIS — G620 Drug-induced polyneuropathy: Secondary | ICD-10-CM | POA: Diagnosis not present

## 2020-01-24 DIAGNOSIS — N183 Chronic kidney disease, stage 3 unspecified: Secondary | ICD-10-CM | POA: Diagnosis not present

## 2020-01-24 LAB — URINALYSIS, COMPLETE (UACMP) WITH MICROSCOPIC
Bilirubin Urine: NEGATIVE
Glucose, UA: NEGATIVE mg/dL
Hgb urine dipstick: NEGATIVE
Ketones, ur: NEGATIVE mg/dL
Nitrite: NEGATIVE
Protein, ur: NEGATIVE mg/dL
Specific Gravity, Urine: 1.02 (ref 1.005–1.030)
pH: 5 (ref 5.0–8.0)

## 2020-01-24 LAB — CBC WITH DIFFERENTIAL/PLATELET
Abs Immature Granulocytes: 0.02 10*3/uL (ref 0.00–0.07)
Basophils Absolute: 0 10*3/uL (ref 0.0–0.1)
Basophils Relative: 1 %
Eosinophils Absolute: 0.3 10*3/uL (ref 0.0–0.5)
Eosinophils Relative: 6 %
HCT: 31.4 % — ABNORMAL LOW (ref 36.0–46.0)
Hemoglobin: 10.7 g/dL — ABNORMAL LOW (ref 12.0–15.0)
Immature Granulocytes: 0 %
Lymphocytes Relative: 13 %
Lymphs Abs: 0.6 10*3/uL — ABNORMAL LOW (ref 0.7–4.0)
MCH: 31.2 pg (ref 26.0–34.0)
MCHC: 34.1 g/dL (ref 30.0–36.0)
MCV: 91.5 fL (ref 80.0–100.0)
Monocytes Absolute: 0.6 10*3/uL (ref 0.1–1.0)
Monocytes Relative: 12 %
Neutro Abs: 3.2 10*3/uL (ref 1.7–7.7)
Neutrophils Relative %: 68 %
Platelets: 138 10*3/uL — ABNORMAL LOW (ref 150–400)
RBC: 3.43 MIL/uL — ABNORMAL LOW (ref 3.87–5.11)
RDW: 12.8 % (ref 11.5–15.5)
WBC: 4.7 10*3/uL (ref 4.0–10.5)
nRBC: 0 % (ref 0.0–0.2)

## 2020-01-24 LAB — COMPREHENSIVE METABOLIC PANEL
ALT: 38 U/L (ref 0–44)
AST: 33 U/L (ref 15–41)
Albumin: 4.1 g/dL (ref 3.5–5.0)
Alkaline Phosphatase: 84 U/L (ref 38–126)
Anion gap: 11 (ref 5–15)
BUN: 41 mg/dL — ABNORMAL HIGH (ref 8–23)
CO2: 23 mmol/L (ref 22–32)
Calcium: 8.9 mg/dL (ref 8.9–10.3)
Chloride: 101 mmol/L (ref 98–111)
Creatinine, Ser: 2.11 mg/dL — ABNORMAL HIGH (ref 0.44–1.00)
GFR calc Af Amer: 28 mL/min — ABNORMAL LOW (ref >60)
GFR calc non Af Amer: 24 mL/min — ABNORMAL LOW (ref >60)
Glucose, Bld: 149 mg/dL — ABNORMAL HIGH (ref 70–99)
Potassium: 4.4 mmol/L (ref 3.5–5.1)
Sodium: 135 mmol/L (ref 135–145)
Total Bilirubin: 0.6 mg/dL (ref 0.3–1.2)
Total Protein: 7.1 g/dL (ref 6.5–8.1)

## 2020-01-24 LAB — MAGNESIUM: Magnesium: 1.1 mg/dL — ABNORMAL LOW (ref 1.7–2.4)

## 2020-01-24 MED ORDER — PROMETHAZINE HCL 25 MG PO TABS: 25.0000 mg | ORAL_TABLET | Freq: Four times a day (QID) | ORAL | 0 refills | Status: DC | PRN

## 2020-01-24 MED ORDER — HEPARIN SOD (PORK) LOCK FLUSH 100 UNIT/ML IV SOLN
500.0000 [IU] | Freq: Once | INTRAVENOUS | Status: AC
  Administered 2020-01-24: 500 [IU] via INTRAVENOUS
  Filled 2020-01-24: qty 5

## 2020-01-24 MED ORDER — SODIUM CHLORIDE 0.9 % IV SOLN
Freq: Once | INTRAVENOUS | Status: AC
  Filled 2020-01-24: qty 250

## 2020-01-24 MED ORDER — PROMETHAZINE HCL 25 MG/ML IJ SOLN
25.0000 mg | Freq: Once | INTRAMUSCULAR | Status: AC
  Administered 2020-01-24: 25 mg via INTRAVENOUS

## 2020-01-24 MED ORDER — SODIUM CHLORIDE 0.9% FLUSH
10.0000 mL | INTRAVENOUS | Status: DC | PRN
  Administered 2020-01-24: 10 mL via INTRAVENOUS
  Filled 2020-01-24: qty 10

## 2020-01-24 MED ORDER — MAGNESIUM SULFATE 2 GM/50ML IV SOLN: 2.0000 g | Freq: Once | INTRAVENOUS | Status: DC

## 2020-01-24 MED ORDER — SODIUM CHLORIDE 0.9 % IV SOLN
3.0000 g | Freq: Once | INTRAVENOUS | Status: AC
  Administered 2020-01-24: 3 g via INTRAVENOUS
  Filled 2020-01-24: qty 6

## 2020-01-24 MED ORDER — PROMETHAZINE HCL 25 MG/ML IJ SOLN
INTRAMUSCULAR | Status: AC
  Filled 2020-01-24: qty 1

## 2020-01-24 NOTE — Telephone Encounter (Signed)
Spoke with Dr Holley Raring office about this patient per, Dr Mike Gip the patient Creatinine levels has increased, Dr Mike Gip has ordered a stat Renal US and labs has been done on the patient today. I have informed front office that Dr Mike Gip would like to speak with Dr Holley Raring about this patient after her visit tomorrow. Message was giving to dr Holley Raring per staff.

## 2020-01-24 NOTE — Telephone Encounter (Signed)
The patient medication has been changes from Pomalyst 3 mg 21 days and off 7 days. The current changes Pomalyst 3 mg 14 days on and 14 days off has been faxed to the [place]. Fax conformation received.

## 2020-01-24 NOTE — Telephone Encounter (Signed)
Labs have been routed to Dr Holley Raring office, per Dr Mike Gip

## 2020-01-24 NOTE — Telephone Encounter (Signed)
Refill medications

## 2020-01-25 ENCOUNTER — Inpatient Hospital Stay: Payer: Medicare PPO

## 2020-01-25 DIAGNOSIS — N1832 Chronic kidney disease, stage 3b: Secondary | ICD-10-CM | POA: Diagnosis not present

## 2020-01-25 DIAGNOSIS — E538 Deficiency of other specified B group vitamins: Secondary | ICD-10-CM | POA: Diagnosis not present

## 2020-01-25 DIAGNOSIS — R110 Nausea: Secondary | ICD-10-CM | POA: Diagnosis not present

## 2020-01-25 DIAGNOSIS — C9000 Multiple myeloma not having achieved remission: Secondary | ICD-10-CM | POA: Diagnosis not present

## 2020-01-25 DIAGNOSIS — D631 Anemia in chronic kidney disease: Secondary | ICD-10-CM | POA: Diagnosis not present

## 2020-01-25 DIAGNOSIS — D709 Neutropenia, unspecified: Secondary | ICD-10-CM | POA: Diagnosis not present

## 2020-01-25 DIAGNOSIS — N183 Chronic kidney disease, stage 3 unspecified: Secondary | ICD-10-CM | POA: Diagnosis not present

## 2020-01-25 DIAGNOSIS — I129 Hypertensive chronic kidney disease with stage 1 through stage 4 chronic kidney disease, or unspecified chronic kidney disease: Secondary | ICD-10-CM | POA: Diagnosis not present

## 2020-01-25 DIAGNOSIS — K5900 Constipation, unspecified: Secondary | ICD-10-CM | POA: Diagnosis not present

## 2020-01-25 DIAGNOSIS — I10 Essential (primary) hypertension: Secondary | ICD-10-CM | POA: Diagnosis not present

## 2020-01-25 DIAGNOSIS — R197 Diarrhea, unspecified: Secondary | ICD-10-CM | POA: Diagnosis not present

## 2020-01-25 DIAGNOSIS — D801 Nonfamilial hypogammaglobulinemia: Secondary | ICD-10-CM | POA: Diagnosis not present

## 2020-01-25 LAB — BASIC METABOLIC PANEL
Anion gap: 9 (ref 5–15)
BUN: 35 mg/dL — ABNORMAL HIGH (ref 8–23)
CO2: 22 mmol/L (ref 22–32)
Calcium: 8.6 mg/dL — ABNORMAL LOW (ref 8.9–10.3)
Chloride: 104 mmol/L (ref 98–111)
Creatinine, Ser: 1.34 mg/dL — ABNORMAL HIGH (ref 0.44–1.00)
GFR calc Af Amer: 49 mL/min — ABNORMAL LOW (ref >60)
GFR calc non Af Amer: 42 mL/min — ABNORMAL LOW (ref >60)
Glucose, Bld: 156 mg/dL — ABNORMAL HIGH (ref 70–99)
Potassium: 4.7 mmol/L (ref 3.5–5.1)
Sodium: 135 mmol/L (ref 135–145)

## 2020-01-25 LAB — MAGNESIUM: Magnesium: 2 mg/dL (ref 1.7–2.4)

## 2020-01-25 MED ORDER — SODIUM CHLORIDE 0.9% FLUSH
10.0000 mL | INTRAVENOUS | Status: DC | PRN
  Administered 2020-01-25: 10 mL via INTRAVENOUS
  Filled 2020-01-25: qty 10

## 2020-01-25 MED ORDER — HEPARIN SOD (PORK) LOCK FLUSH 100 UNIT/ML IV SOLN
500.0000 [IU] | Freq: Once | INTRAVENOUS | Status: AC
  Administered 2020-01-25: 500 [IU] via INTRAVENOUS
  Filled 2020-01-25: qty 5

## 2020-01-25 NOTE — Progress Notes (Signed)
Magnesium 2.0 today. No IV magnesium required.

## 2020-01-26 ENCOUNTER — Other Ambulatory Visit: Payer: Self-pay

## 2020-01-26 ENCOUNTER — Telehealth: Payer: Self-pay

## 2020-01-26 ENCOUNTER — Ambulatory Visit
Admission: EM | Admit: 2020-01-26 | Discharge: 2020-01-26 | Disposition: A | Discharge status: Home / Self Care | Payer: Medicare PPO | Attending: Emergency Medicine | Admitting: Emergency Medicine

## 2020-01-26 ENCOUNTER — Encounter: Payer: Self-pay | Admitting: Emergency Medicine

## 2020-01-26 ENCOUNTER — Other Ambulatory Visit: Payer: Medicare PPO

## 2020-01-26 ENCOUNTER — Ambulatory Visit: Payer: Medicare PPO

## 2020-01-26 DIAGNOSIS — R0981 Nasal congestion: Secondary | ICD-10-CM

## 2020-01-26 DIAGNOSIS — Z20822 Contact with and (suspected) exposure to covid-19: Secondary | ICD-10-CM | POA: Diagnosis not present

## 2020-01-26 LAB — SARS CORONAVIRUS 2 AG (30 MIN TAT): SARS Coronavirus 2 Ag: NEGATIVE

## 2020-01-26 NOTE — ED Provider Notes (Signed)
[facility] ____________________________________________  Time seen: Approximately 7:04 PM  I have reviewed the triage vital signs and the nursing notes.   HISTORY  Chief Complaint Cough and Covid Exposure   HPI Sarah [name] is a 64 y.o. female past medical history of multiple myeloma, diabetes, congestive heart failure, chronic kidney disease presenting for evaluation of nasal congestion.  Patient reports last 2 days having some nasal congestion and "head cold ".  Patient denies sore throat, cough, chest pain, shortness of breath, fever, vomiting or diarrhea.  Denies changes in taste or smell.  Does report that this afternoon she received a phone call informing her of a direct positive exposure to COVID-19.  States her great niece tested positive for COVID-19 today.  States she was last around the contact this past Sunday.  Denies other complaints.  Denies aggravating or alleviating factors otherwise.  Patient reports that is not uncommon for her to have nasal congestion after her chemo which is recent.  Glean Hess, MD : PCP    Past Medical History:  Diagnosis Date   Abnormal stress test 02/14/2016   Overview:  Added automatically from request for surgery [phone]   Anemia    Anxiety    Arthritis    Bicuspid aortic valve    CHF (congestive heart failure) ([place])    CKD (chronic kidney disease) stage 3, GFR 30-59 ml/min    Depression    Diabetes mellitus ([place])    Dizziness    Fatty liver    Frequent falls    GERD (gastroesophageal reflux disease)    Gout    Heart murmur    History of blood transfusion    History of bone marrow transplant ([place])    History of uterine fibroid    Hx of cardiac catheterization 06/05/2016   Overview:  Normal coronaries 2017   Hypertension    Hypomagnesemia    Multiple myeloma ([place])    Personal history of chemotherapy    Renal cyst     Patient Active Problem List   Diagnosis Date Noted    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   Acute renal insufficiency 01/24/2020   Sepsis ([place]) 11/13/2019   Pneumonia of right lung due to infectious organism 11/13/2019   Constipation 10/24/2019   AKI (acute kidney injury) ([place]) 10/15/2019   Neutropenic fever ([place]) 10/15/2019   Febrile neutropenia ([place]) 10/15/2019   Encounter for antineoplastic immunotherapy 10/03/2019   Hypocalcemia 08/28/2019   Stage 3 chronic kidney disease 05/12/2019   Chronic nausea 12/17/2018   Chemotherapy-induced neuropathy ([place]) 12/17/2018   Hyperlipidemia associated with type 2 diabetes mellitus ([place]) 11/25/2018   Normocytic anemia 10/21/2018   Hypomagnesemia 08/20/2018   Goals of care, counseling/discussion 08/20/2018   B12 deficiency 08/20/2018   Thrombocytopenia ([place]) 02/09/2018   Benign essential HTN 08/21/2017   Cardiomyopathy, idiopathic ([place]) 08/21/2017   Carotid artery stenosis, asymptomatic, bilateral 08/06/2017   Thyroid nodule 08/06/2017   Cardiac syncope 07/25/2017   Iron deficiency anemia due to chronic blood loss 05/13/2017   Spinal stenosis of lumbar region with neurogenic claudication 04/09/2017   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   LBBB (left bundle branch block) 10/10/2016   Long term prescription opiate use 09/07/2016   Chronic pain syndrome 07/08/2016   Pain medication agreement signed 07/08/2016   Spondylosis of lumbar region without myelopathy or radiculopathy 07/08/2016   Chronic diastolic CHF (congestive heart failure), NYHA class 2 ([place]) 06/05/2016   LVH (left ventricular hypertrophy) due to hypertensive disease, with heart failure (  [place]) 06/05/2016   Mild aortic valve stenosis 06/05/2016   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[place]) 06/05/2016   Environmental and seasonal allergies 04/19/2016   Insomnia 04/19/2016   Bicuspid aortic valve 04/19/2016   Asthma 04/19/2016   Aortic regurgitation 04/19/2016   Type II diabetes  mellitus with complication ([place]) 70/96/2836   Depression, major, single episode, in partial remission ([place]) 04/12/2016   Irritable bowel syndrome (IBS) 04/12/2016   Hepatic steatosis 04/12/2016   Multiple myeloma not having achieved remission ([place]) 04/02/2016   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[place]) 07/06/2015   Stem cell transplant candidate 05/16/2015   Disequilibrium 01/14/2013    Past Surgical History:  Procedure Laterality Date   ABDOMINAL HYSTERECTOMY     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   CARDIAC ELECTROPHYSIOLOGY MAPPING AND ABLATION     CARPAL TUNNEL RELEASE Bilateral    CHOLECYSTECTOMY  2008   COLONOSCOPY WITH PROPOFOL N/A 05/07/2017   Procedure: COLONOSCOPY WITH PROPOFOL;  Surgeon: [name], MD;  Location: [facility] ENDOSCOPY;  Service: Gastroenterology;  Laterality: N/A;   ESOPHAGOGASTRODUODENOSCOPY (EGD) WITH PROPOFOL N/A 05/07/2017   Procedure: ESOPHAGOGASTRODUODENOSCOPY (EGD) WITH PROPOFOL;  Surgeon: [name], MD;  Location: [facility] ENDOSCOPY;  Service: Gastroenterology;  Laterality: N/A;   FOOT SURGERY Bilateral    INCONTINENCE SURGERY  2009   INTERSTIM IMPLANT PLACEMENT     other     over active bladder   OTHER SURGICAL HISTORY     bladder stimulator    PARTIAL HYSTERECTOMY  03/1996   fibroids   PORTA CATH INSERTION N/A 03/10/2019   Procedure: PORTA CATH INSERTION;  Surgeon: Algernon Huxley, MD;  Location: [facility];  Service: Cardiovascular;  Laterality: N/A;   TONSILLECTOMY  2007     No current facility-administered medications for this encounter.  Current Outpatient Medications:    acyclovir (ZOVIRAX) 400 MG tablet, Take 1 tablet (400 mg total) by mouth 2 (two) times daily., Disp: 60 tablet, Rfl: 11   allopurinol (ZYLOPRIM) 100 MG tablet, TAKE 1 TABLET(100 MG) BY MOUTH DAILY as needed, Disp: 90 tablet, Rfl: 1   aspirin 81 MG chewable tablet, Chew 1 tablet (81 mg total) by mouth daily., Disp: 30 tablet, Rfl: 0    dexamethasone (DECADRON) 4 MG tablet, Take 5 tablets (61m) on days 2, 9, 16, 23 of cycle 1 and 2 Take 5 tablets (279m on days 2, 16 of cycle 3,4,5 and 6 Take 5 tablets (2068mon days 2 of cycles 7 and beyond, Disp: 120 tablet, Rfl: 0   diclofenac sodium (VOLTAREN) 1 % GEL, Apply 2 g topically 4 (four) times daily., Disp: 100 g, Rfl: 1   diphenhydrAMINE (BENADRYL) 50 MG capsule, Take 50 mg by mouth as needed. , Disp: , Rfl:    diphenoxylate-atropine (LOMOTIL) 2.5-0.025 MG tablet, Take 2 tablets by mouth daily as needed for diarrhea or loose stools., Disp: 45 tablet, Rfl: 2   fentaNYL (DURAGESIC - DOSED MCG/HR) 25 MCG/HR patch, Place 25 mcg onto the skin every 3 (three) days. , Disp: , Rfl:    fexofenadine (ALLEGRA) 180 MG tablet, TAKE 1 TABLET(180 MG) BY MOUTH DAILY, Disp: 30 tablet, Rfl: 5   fluconazole (DIFLUCAN) 150 MG tablet, Take 1 tablet (150 mg total) by mouth daily., Disp: 1 tablet, Rfl: 0   FLUoxetine (PROZAC) 40 MG capsule, TAKE 1 CAPSULE EVERY DAY, Disp: 90 capsule, Rfl: 1   fluticasone (FLONASE) 50 MCG/ACT nasal spray, Place 2 sprays into both nostrils daily., Disp: 16 g, Rfl: 2  furosemide (LASIX) 20 MG tablet, Take 20 mg by mouth as needed. , Disp: , Rfl:    glucose blood (ONE TOUCH ULTRA TEST) test strip, , Disp: , Rfl:    hydrOXYzine (ATARAX/VISTARIL) 10 MG tablet, TAKE 1 TABLET(10 MG) BY MOUTH THREE TIMES DAILY AS NEEDED, Disp: 90 tablet, Rfl: 0   lactulose (CHRONULAC) 10 GM/15ML solution, , Disp: , Rfl:    lidocaine-prilocaine (EMLA) cream, APPLY TOPICALLY AS NEEDED., Disp: 30 g, Rfl: 0   lisinopril (ZESTRIL) 10 MG tablet, Take 10 mg by mouth once., Disp: , Rfl:    metFORMIN (GLUCOPHAGE-XR) 500 MG 24 hr tablet, Take 1 tablet (500 mg total) by mouth daily with breakfast., Disp: 90 tablet, Rfl: 1   Misc Natural Products (OSTEO BI-FLEX ADV TRIPLE ST PO), Take 2 tablets by mouth daily., Disp: , Rfl:    montelukast (SINGULAIR) 10 MG tablet, Take 1 tablet by mouth on  the day before treatment and for 2 days after treatment., Disp: 30 tablet, Rfl: 0   Multiple Vitamins-Minerals (HAIR/SKIN/NAILS/BIOTIN PO), Take 1 capsule by mouth 3 (three) times daily., Disp: , Rfl:    NARCAN 4 MG/0.1ML LIQD nasal spray kit, 1 spray. , Disp: , Rfl:    Omega 3-6-9 Fatty Acids (OMEGA 3-6-9 COMPLEX) CAPS, Take by mouth., Disp: , Rfl:    omeprazole (PRILOSEC) 40 MG capsule, TAKE 1 CAPSULE EVERY DAY, Disp: 90 capsule, Rfl: 3   pomalidomide (POMALYST) 3 MG capsule, TAKE 1 CAPSULE BY MOUTH EVERY DAY   Rem's # [id number] Date obtained: 01/05/2020, Disp: 21 capsule, Rfl: 0   promethazine (PHENERGAN) 25 MG tablet, Take 1 tablet (25 mg total) by mouth every 6 (six) hours as needed for nausea or vomiting., Disp: 90 tablet, Rfl: 0   promethazine-dextromethorphan (PROMETHAZINE-DM) 6.25-15 MG/5ML syrup, Take 5 mLs by mouth 4 (four) times daily as needed for cough., Disp: 240 mL, Rfl: 0   tiZANidine (ZANAFLEX) 4 MG tablet, Take 1 tablet (4 mg total) by mouth every 8 (eight) hours as needed for muscle spasms., Disp: 270 tablet, Rfl: 1   traZODone (DESYREL) 100 MG tablet, TAKE 1 TABLET AT BEDTIME  FOR  SLEEP, Disp: 90 tablet, Rfl: 1   acetaminophen (TYLENOL) 500 MG tablet, Take 500 mg by mouth every 6 (six) hours as needed. (Patient not taking: Reported on 01/24/2020), Disp: , Rfl:   Facility-Administered Medications Ordered in Other Encounters:    0.9 %  sodium chloride infusion, , Intravenous, Continuous, Corcoran, Drue Second, MD, Stopped at 01/20/20 1232  Allergies Oxycodone-acetaminophen, Celebrex [celecoxib], Codeine, Plerixafor, Benadryl [diphenhydramine], Morphine, Ondansetron, and Tylenol [acetaminophen]  Family History  Problem Relation Age of Onset   Colon cancer Father    Renal Disease Father    Diabetes Mellitus II Father    Melanoma Paternal Grandmother    Breast cancer Maternal Aunt 63   Anemia Mother    Heart disease Mother    Heart failure Mother    Renal  Disease Mother    Congestive Heart Failure Mother    Heart disease Maternal Uncle    Throat cancer Maternal Uncle    Lung cancer Maternal Uncle    Liver disease Maternal Uncle    Heart failure Maternal Uncle    Hearing loss Son 42       Suicide     Social History Social History   Tobacco Use   Smoking status: Former Smoker    Packs/day: 1.00    Years: 20.00    Pack years: 20.00  Types: Cigarettes    Quit date: 07/02/1991    Years since quitting: 28.5   Smokeless tobacco: Never Used  Vaping Use   Vaping Use: Never used  Substance Use Topics   Alcohol use: Not Currently   Drug use: No    Review of Systems Constitutional: No fever ENT: Positive congestion. Cardiovascular: Denies chest pain. Respiratory: Denies shortness of breath. Gastrointestinal: No abdominal pain.  No nausea, no vomiting.  No diarrhea.   Musculoskeletal: Negative for back pain. Skin: Negative for rash.   ____________________________________________   PHYSICAL EXAM:  VITAL SIGNS: ED Triage Vitals  Enc Vitals Group     BP 01/26/20 1817 (!) 113/60     Pulse Rate 01/26/20 1817 90     Resp 01/26/20 1817 18     Temp 01/26/20 1817 98.6 F (37 C)     Temp Source 01/26/20 1817 Oral     SpO2 01/26/20 1817 98 %     Weight 01/26/20 1816 144 lb (65.3 kg)     Height 01/26/20 1816 5' 2"  (1.575 m)     Head Circumference --      Peak Flow --      Pain Score 01/26/20 1816 0     Pain Loc --      Pain Edu? --      Excl. in [place]? --     Constitutional: Alert and oriented. Well appearing and in no acute distress. Eyes: Conjunctivae are normal.  Head: Atraumatic. No sinus tenderness to palpation. No swelling. No erythema.  Ears: no erythema, normal TMs bilaterally.   Nose: Minimal nasal congestion.  Mouth/Throat: Mucous membranes are moist. No pharyngeal erythema. No tonsillar swelling or exudate.  Neck: No stridor.  No cervical spine tenderness to  palpation. Hematological/Lymphatic/Immunilogical: No cervical lymphadenopathy. Cardiovascular: Normal rate, regular rhythm. Grossly normal heart sounds.  Good peripheral circulation. Respiratory: Normal respiratory effort.  No retractions. No wheezes, rales or rhonchi. Good air movement.  Musculoskeletal: Ambulatory with steady gait.  Neurologic:  Normal speech and language. No gait instability. Skin:  Skin appears warm, dry and intact. No rash noted. Psychiatric: Mood and affect are normal. Speech and behavior are normal.  ___________________________________________   LABS (all labs ordered are listed, but only abnormal results are displayed)  Labs Reviewed  SARS CORONAVIRUS 2 (TAT 6-24 HRS)  SARS CORONAVIRUS 2 AG (30 MIN TAT)     PROCEDURES Procedures    INITIAL IMPRESSION / ASSESSMENT AND PLAN / ED COURSE  Pertinent labs & imaging results that were available during my care of the patient were reviewed by me and considered in my medical decision making (see chart for details).  Steady gait.  Overall well-appearing patient.  No acute distress.  Direct COVID-19 exposure.  With patient's multiple comorbidities, rapid COVID-19 testing ordered.  Rapid COVID-19 testing negative.  PCR sent.  Supportive care, monitoring, over-the-counter congestion medication as needed.  Discussed very strict follow-up and return parameters.  Discussed follow up with Primary care physician this week. Discussed follow up and return parameters including no resolution or any worsening concerns. Patient verbalized understanding and agreed to plan.   ____________________________________________   FINAL CLINICAL IMPRESSION(S) / ED DIAGNOSES  Final diagnoses:  Nasal congestion  Exposure to COVID-19 virus  Encounter for screening laboratory testing for COVID-19 virus     ED Discharge Orders    None       Note: This dictation was prepared with Dragon dictation along with smaller phrase technology.  Any transcriptional errors that result  from this process are unintentional.         Marylene Land, NP 01/26/20 1939

## 2020-01-26 NOTE — Telephone Encounter (Signed)
I received a message from Ms Hassan Rowan stating that this patient called and reports she has been exposed by her granddaughter of [place] -58. I was able to leave a message to inform her that she has to be tested for COVID -19 before coming into the office. I have informed her that she could go to the medical mall drive through or Urgent care to be tested for COVID-19. I have informed the patient to contact the office as soon as possible.

## 2020-01-26 NOTE — ED Triage Notes (Signed)
Patient states she was exposed to [place]. She was notified today at 3pm. Patient states she has a cough and nasal congestion.

## 2020-01-26 NOTE — Discharge Instructions (Signed)
Over-the-counter medication as needed.  Monitor.  Rest. Drink plenty of fluids.   Follow up with your primary care physician this week as needed. Return to Urgent care for new or worsening concerns.

## 2020-01-27 ENCOUNTER — Encounter: Payer: Self-pay | Admitting: Hematology and Oncology

## 2020-01-27 ENCOUNTER — Inpatient Hospital Stay: Payer: Medicare PPO

## 2020-01-27 VITALS — BP 152/67 | HR 82 | Temp 97.7°F | Resp 17

## 2020-01-27 DIAGNOSIS — D801 Nonfamilial hypogammaglobulinemia: Secondary | ICD-10-CM | POA: Diagnosis not present

## 2020-01-27 DIAGNOSIS — E860 Dehydration: Secondary | ICD-10-CM

## 2020-01-27 DIAGNOSIS — K5900 Constipation, unspecified: Secondary | ICD-10-CM | POA: Diagnosis not present

## 2020-01-27 DIAGNOSIS — I129 Hypertensive chronic kidney disease with stage 1 through stage 4 chronic kidney disease, or unspecified chronic kidney disease: Secondary | ICD-10-CM | POA: Diagnosis not present

## 2020-01-27 DIAGNOSIS — C9000 Multiple myeloma not having achieved remission: Secondary | ICD-10-CM

## 2020-01-27 DIAGNOSIS — R197 Diarrhea, unspecified: Secondary | ICD-10-CM | POA: Diagnosis not present

## 2020-01-27 DIAGNOSIS — R110 Nausea: Secondary | ICD-10-CM | POA: Diagnosis not present

## 2020-01-27 DIAGNOSIS — D709 Neutropenia, unspecified: Secondary | ICD-10-CM | POA: Diagnosis not present

## 2020-01-27 DIAGNOSIS — N183 Chronic kidney disease, stage 3 unspecified: Secondary | ICD-10-CM | POA: Diagnosis not present

## 2020-01-27 DIAGNOSIS — E538 Deficiency of other specified B group vitamins: Secondary | ICD-10-CM | POA: Diagnosis not present

## 2020-01-27 LAB — BASIC METABOLIC PANEL
Anion gap: 7 (ref 5–15)
BUN: 16 mg/dL (ref 8–23)
CO2: 25 mmol/L (ref 22–32)
Calcium: 9.1 mg/dL (ref 8.9–10.3)
Chloride: 107 mmol/L (ref 98–111)
Creatinine, Ser: 0.96 mg/dL (ref 0.44–1.00)
GFR calc Af Amer: >60 mL/min (ref >60)
GFR calc non Af Amer: >60 mL/min (ref >60)
Glucose, Bld: 136 mg/dL — ABNORMAL HIGH (ref 70–99)
Potassium: 4.5 mmol/L (ref 3.5–5.1)
Sodium: 139 mmol/L (ref 135–145)

## 2020-01-27 LAB — SARS CORONAVIRUS 2 (TAT 6-24 HRS): SARS Coronavirus 2: NEGATIVE

## 2020-01-27 LAB — MAGNESIUM: Magnesium: 1.5 mg/dL — ABNORMAL LOW (ref 1.7–2.4)

## 2020-01-27 MED ORDER — SODIUM CHLORIDE 0.9 % IV SOLN
Freq: Once | INTRAVENOUS | Status: AC
  Filled 2020-01-27: qty 250

## 2020-01-27 MED ORDER — MAGNESIUM SULFATE 2 GM/50ML IV SOLN
2.0000 g | Freq: Once | INTRAVENOUS | Status: AC
  Administered 2020-01-27: 2 g via INTRAVENOUS
  Filled 2020-01-27: qty 50

## 2020-01-27 MED ORDER — SODIUM CHLORIDE 0.9% FLUSH
10.0000 mL | INTRAVENOUS | Status: DC | PRN
  Administered 2020-01-27: 10 mL via INTRAVENOUS
  Filled 2020-01-27: qty 10

## 2020-01-27 MED ORDER — HEPARIN SOD (PORK) LOCK FLUSH 100 UNIT/ML IV SOLN
500.0000 [IU] | Freq: Once | INTRAVENOUS | Status: AC
  Administered 2020-01-27: 500 [IU] via INTRAVENOUS
  Filled 2020-01-27: qty 5

## 2020-01-27 MED ORDER — PROMETHAZINE HCL 25 MG/ML IJ SOLN
25.0000 mg | Freq: Once | INTRAMUSCULAR | Status: AC
  Administered 2020-01-27: 25 mg via INTRAVENOUS

## 2020-01-28 ENCOUNTER — Other Ambulatory Visit: Payer: Self-pay | Admitting: Hematology and Oncology

## 2020-01-28 ENCOUNTER — Other Ambulatory Visit: Payer: Self-pay | Admitting: Internal Medicine

## 2020-01-28 DIAGNOSIS — R110 Nausea: Secondary | ICD-10-CM

## 2020-01-28 DIAGNOSIS — N182 Chronic kidney disease, stage 2 (mild): Secondary | ICD-10-CM

## 2020-01-28 DIAGNOSIS — F5101 Primary insomnia: Secondary | ICD-10-CM

## 2020-01-28 DIAGNOSIS — E1122 Type 2 diabetes mellitus with diabetic chronic kidney disease: Secondary | ICD-10-CM

## 2020-01-28 NOTE — Telephone Encounter (Signed)
Requested medication (s) are due for refill today: Yes  Requested medication (s) are on the active medication list: Yes  Last refill:  12/29/18  Future visit scheduled: No  Notes to clinic:  Prescription has expired.    Requested Prescriptions  Pending Prescriptions Disp Refills   metFORMIN (GLUCOPHAGE-XR) 500 MG 24 hr tablet [Pharmacy Med Name: METFORMIN HYDROCHLORIDE ER 500 MG Tablet Extended Release 24 Hour] 90 tablet 1    Sig: TAKE 1 TABLET (500 MG TOTAL) BY MOUTH DAILY WITH BREAKFAST.      Endocrinology:  Diabetes - Biguanides Passed - 01/28/2020 11:24 AM      Passed - Cr in normal range and within 360 days    Creatinine, Ser  Date Value Ref Range Status  01/27/2020 0.96 0.44 - 1.00 mg/dL Final          Passed - HBA1C is between 0 and 7.9 and within 180 days    Hgb A1c MFr Bld  Date Value Ref Range Status  10/16/2019 6.4 (H) 4.8 - 5.6 % Final    Comment:    (NOTE) Pre diabetes:          5.7%-6.4% Diabetes:              >6.4% Glycemic control for   <7.0% adults with diabetes           Passed - eGFR in normal range and within 360 days    GFR calc Af Amer  Date Value Ref Range Status  01/27/2020 >60 >60 mL/min Final   GFR calc non Af Amer  Date Value Ref Range Status  01/27/2020 >60 >60 mL/min Final          Passed - Valid encounter within last 6 months    Recent Outpatient Visits           1 month ago Annual physical exam   [facility] Glean Hess, MD   3 months ago Benign essential HTN   [facility] Glean Hess, MD   4 months ago Acute non-recurrent sinusitis, unspecified location   [facility] Glean Hess, MD   1 year ago Fullness of supraclavicular fossa   [facility] Glean Hess, MD   1 year ago Benign essential HTN   [facility] Glean Hess, MD       Future Appointments             In 2 months Army Melia Jesse Sans, MD [facility], [place]   In 10 months  Glean Hess, MD [facility]             Signed Prescriptions Disp Refills   traZODone (DESYREL) 100 MG tablet 90 tablet 0    Sig: TAKE 1 TABLET AT BEDTIME  FOR  SLEEP      Psychiatry: Antidepressants - Serotonin Modulator Passed - 01/28/2020 11:24 AM      Passed - Completed PHQ-2 or PHQ-9 in the last 360 days.      Passed - Valid encounter within last 6 months    Recent Outpatient Visits           1 month ago Annual physical exam   [facility] Glean Hess, MD   3 months ago Benign essential HTN   [place], [place]   4 months ago Acute non-recurrent sinusitis, unspecified location   [facility] Glean Hess, MD   1 year  ago Fullness of supraclavicular fossa   [facility] Glean Hess, MD   1 year ago Benign essential HTN   [facility] Glean Hess, MD       Future Appointments             In 2 months Army Melia Jesse Sans, MD [facility], [place]   In 10 months Army Melia Jesse Sans, MD [facility], [facility]

## 2020-01-31 ENCOUNTER — Inpatient Hospital Stay: Payer: Medicare PPO

## 2020-01-31 ENCOUNTER — Inpatient Hospital Stay: Payer: Medicare PPO | Attending: Hematology and Oncology

## 2020-01-31 ENCOUNTER — Other Ambulatory Visit: Payer: Self-pay

## 2020-01-31 DIAGNOSIS — C9000 Multiple myeloma not having achieved remission: Secondary | ICD-10-CM | POA: Insufficient documentation

## 2020-01-31 DIAGNOSIS — E538 Deficiency of other specified B group vitamins: Secondary | ICD-10-CM | POA: Insufficient documentation

## 2020-01-31 DIAGNOSIS — Z808 Family history of malignant neoplasm of other organs or systems: Secondary | ICD-10-CM | POA: Insufficient documentation

## 2020-01-31 DIAGNOSIS — D801 Nonfamilial hypogammaglobulinemia: Secondary | ICD-10-CM | POA: Insufficient documentation

## 2020-01-31 DIAGNOSIS — R61 Generalized hyperhidrosis: Secondary | ICD-10-CM | POA: Insufficient documentation

## 2020-01-31 DIAGNOSIS — E119 Type 2 diabetes mellitus without complications: Secondary | ICD-10-CM | POA: Insufficient documentation

## 2020-01-31 DIAGNOSIS — Z8379 Family history of other diseases of the digestive system: Secondary | ICD-10-CM | POA: Insufficient documentation

## 2020-01-31 DIAGNOSIS — Z841 Family history of disorders of kidney and ureter: Secondary | ICD-10-CM | POA: Diagnosis not present

## 2020-01-31 DIAGNOSIS — Z800 Family history of malignant neoplasm of digestive organs: Secondary | ICD-10-CM | POA: Diagnosis not present

## 2020-01-31 DIAGNOSIS — R531 Weakness: Secondary | ICD-10-CM | POA: Insufficient documentation

## 2020-01-31 DIAGNOSIS — Z822 Family history of deafness and hearing loss: Secondary | ICD-10-CM | POA: Insufficient documentation

## 2020-01-31 DIAGNOSIS — Z833 Family history of diabetes mellitus: Secondary | ICD-10-CM | POA: Diagnosis not present

## 2020-01-31 DIAGNOSIS — G629 Polyneuropathy, unspecified: Secondary | ICD-10-CM | POA: Insufficient documentation

## 2020-01-31 DIAGNOSIS — Z79899 Other long term (current) drug therapy: Secondary | ICD-10-CM | POA: Insufficient documentation

## 2020-01-31 DIAGNOSIS — Z9221 Personal history of antineoplastic chemotherapy: Secondary | ICD-10-CM | POA: Insufficient documentation

## 2020-01-31 DIAGNOSIS — Z8249 Family history of ischemic heart disease and other diseases of the circulatory system: Secondary | ICD-10-CM | POA: Insufficient documentation

## 2020-01-31 DIAGNOSIS — D649 Anemia, unspecified: Secondary | ICD-10-CM | POA: Diagnosis not present

## 2020-01-31 DIAGNOSIS — Z832 Family history of diseases of the blood and blood-forming organs and certain disorders involving the immune mechanism: Secondary | ICD-10-CM | POA: Diagnosis not present

## 2020-01-31 DIAGNOSIS — Z801 Family history of malignant neoplasm of trachea, bronchus and lung: Secondary | ICD-10-CM | POA: Diagnosis not present

## 2020-01-31 DIAGNOSIS — Z818 Family history of other mental and behavioral disorders: Secondary | ICD-10-CM | POA: Insufficient documentation

## 2020-01-31 DIAGNOSIS — R109 Unspecified abdominal pain: Secondary | ICD-10-CM | POA: Diagnosis not present

## 2020-01-31 DIAGNOSIS — Z803 Family history of malignant neoplasm of breast: Secondary | ICD-10-CM | POA: Insufficient documentation

## 2020-01-31 DIAGNOSIS — N183 Chronic kidney disease, stage 3 unspecified: Secondary | ICD-10-CM | POA: Insufficient documentation

## 2020-01-31 DIAGNOSIS — R110 Nausea: Secondary | ICD-10-CM | POA: Diagnosis not present

## 2020-01-31 DIAGNOSIS — Z5112 Encounter for antineoplastic immunotherapy: Secondary | ICD-10-CM | POA: Diagnosis not present

## 2020-01-31 LAB — MAGNESIUM: Magnesium: 1.2 mg/dL — ABNORMAL LOW (ref 1.7–2.4)

## 2020-01-31 LAB — BASIC METABOLIC PANEL
Anion gap: 9 (ref 5–15)
BUN: 20 mg/dL (ref 8–23)
CO2: 23 mmol/L (ref 22–32)
Calcium: 9.2 mg/dL (ref 8.9–10.3)
Chloride: 102 mmol/L (ref 98–111)
Creatinine, Ser: 1.13 mg/dL — ABNORMAL HIGH (ref 0.44–1.00)
GFR calc Af Amer: 60 mL/min — ABNORMAL LOW (ref >60)
GFR calc non Af Amer: 52 mL/min — ABNORMAL LOW (ref >60)
Glucose, Bld: 122 mg/dL — ABNORMAL HIGH (ref 70–99)
Potassium: 3.9 mmol/L (ref 3.5–5.1)
Sodium: 134 mmol/L — ABNORMAL LOW (ref 135–145)

## 2020-01-31 LAB — CBC WITH DIFFERENTIAL/PLATELET
Abs Immature Granulocytes: 0.01 10*3/uL (ref 0.00–0.07)
Basophils Absolute: 0 10*3/uL (ref 0.0–0.1)
Basophils Relative: 1 %
Eosinophils Absolute: 0.2 10*3/uL (ref 0.0–0.5)
Eosinophils Relative: 4 %
HCT: 31.2 % — ABNORMAL LOW (ref 36.0–46.0)
Hemoglobin: 10.9 g/dL — ABNORMAL LOW (ref 12.0–15.0)
Immature Granulocytes: 0 %
Lymphocytes Relative: 21 %
Lymphs Abs: 0.9 10*3/uL (ref 0.7–4.0)
MCH: 31.1 pg (ref 26.0–34.0)
MCHC: 34.9 g/dL (ref 30.0–36.0)
MCV: 89.1 fL (ref 80.0–100.0)
Monocytes Absolute: 0.7 10*3/uL (ref 0.1–1.0)
Monocytes Relative: 16 %
Neutro Abs: 2.4 10*3/uL (ref 1.7–7.7)
Neutrophils Relative %: 58 %
Platelets: 144 10*3/uL — ABNORMAL LOW (ref 150–400)
RBC: 3.5 MIL/uL — ABNORMAL LOW (ref 3.87–5.11)
RDW: 12.5 % (ref 11.5–15.5)
WBC: 4.1 10*3/uL (ref 4.0–10.5)
nRBC: 0 % (ref 0.0–0.2)

## 2020-01-31 MED ORDER — HEPARIN SOD (PORK) LOCK FLUSH 100 UNIT/ML IV SOLN
250.0000 [IU] | Freq: Once | INTRAVENOUS | Status: DC | PRN
  Filled 2020-01-31: qty 5

## 2020-01-31 MED ORDER — HEPARIN SOD (PORK) LOCK FLUSH 100 UNIT/ML IV SOLN
500.0000 [IU] | Freq: Once | INTRAVENOUS | Status: AC | PRN
  Administered 2020-01-31: 500 [IU]
  Filled 2020-01-31: qty 5

## 2020-01-31 MED ORDER — SODIUM CHLORIDE 0.9 % IV SOLN
6.0000 g | Freq: Once | INTRAVENOUS | Status: AC
  Administered 2020-01-31: 6 g via INTRAVENOUS
  Filled 2020-01-31: qty 12

## 2020-01-31 MED ORDER — SODIUM CHLORIDE 0.9 % IV SOLN
INTRAVENOUS | Status: DC
  Filled 2020-01-31: qty 250

## 2020-01-31 MED ORDER — PROMETHAZINE HCL 25 MG/ML IJ SOLN
25.0000 mg | Freq: Once | INTRAMUSCULAR | Status: AC
  Administered 2020-01-31: 25 mg via INTRAVENOUS
  Filled 2020-01-31: qty 1

## 2020-02-01 ENCOUNTER — Encounter: Payer: Self-pay | Admitting: Hematology and Oncology

## 2020-02-01 DIAGNOSIS — C9001 Multiple myeloma in remission: Secondary | ICD-10-CM | POA: Diagnosis not present

## 2020-02-03 ENCOUNTER — Other Ambulatory Visit: Payer: Self-pay

## 2020-02-03 ENCOUNTER — Other Ambulatory Visit: Payer: Self-pay | Admitting: Nurse Practitioner

## 2020-02-03 ENCOUNTER — Inpatient Hospital Stay: Payer: Medicare PPO

## 2020-02-03 VITALS — BP 124/55 | HR 84 | Temp 95.9°F | Resp 16

## 2020-02-03 DIAGNOSIS — D801 Nonfamilial hypogammaglobulinemia: Secondary | ICD-10-CM | POA: Diagnosis not present

## 2020-02-03 DIAGNOSIS — Z5112 Encounter for antineoplastic immunotherapy: Secondary | ICD-10-CM | POA: Diagnosis not present

## 2020-02-03 DIAGNOSIS — Z7682 Awaiting organ transplant status: Secondary | ICD-10-CM | POA: Diagnosis not present

## 2020-02-03 DIAGNOSIS — C9000 Multiple myeloma not having achieved remission: Secondary | ICD-10-CM

## 2020-02-03 DIAGNOSIS — D649 Anemia, unspecified: Secondary | ICD-10-CM | POA: Diagnosis not present

## 2020-02-03 DIAGNOSIS — E538 Deficiency of other specified B group vitamins: Secondary | ICD-10-CM | POA: Diagnosis not present

## 2020-02-03 DIAGNOSIS — R61 Generalized hyperhidrosis: Secondary | ICD-10-CM | POA: Diagnosis not present

## 2020-02-03 DIAGNOSIS — Z9229 Personal history of other drug therapy: Secondary | ICD-10-CM | POA: Diagnosis not present

## 2020-02-03 DIAGNOSIS — R110 Nausea: Secondary | ICD-10-CM | POA: Diagnosis not present

## 2020-02-03 DIAGNOSIS — R531 Weakness: Secondary | ICD-10-CM | POA: Diagnosis not present

## 2020-02-03 DIAGNOSIS — G629 Polyneuropathy, unspecified: Secondary | ICD-10-CM | POA: Diagnosis not present

## 2020-02-03 LAB — MAGNESIUM: Magnesium: 1.1 mg/dL — ABNORMAL LOW (ref 1.7–2.4)

## 2020-02-03 MED ORDER — HEPARIN SOD (PORK) LOCK FLUSH 100 UNIT/ML IV SOLN
500.0000 [IU] | Freq: Once | INTRAVENOUS | Status: AC | PRN
  Administered 2020-02-03: 500 [IU]
  Filled 2020-02-03: qty 5

## 2020-02-03 MED ORDER — SODIUM CHLORIDE 0.9% FLUSH
10.0000 mL | INTRAVENOUS | Status: DC | PRN
  Administered 2020-02-03: 10 mL via INTRAVENOUS
  Filled 2020-02-03: qty 10

## 2020-02-03 MED ORDER — SODIUM CHLORIDE 0.9 % IV SOLN
6.0000 g | Freq: Once | INTRAVENOUS | Status: AC
  Administered 2020-02-03: 6 g via INTRAVENOUS
  Filled 2020-02-03: qty 12

## 2020-02-03 NOTE — Progress Notes (Signed)
[facility]  [address] [place], [place] [address] Phone: [phone]  Fax: [phone]   Clinic Day:  02/07/2020   Referring physician: Glean Hess, MD  Chief Complaint: Sarah [name] is a 64 y.o. 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   HPI: The patient was last seen in the medical oncology clinic on 01/24/2020.   At that time, she noted bad nausea and recent emesis. Creatinine had increased from 1.04 to 2.11. Hematocrit was 31.4, hemoglobin 10.7, MCV 91.5, platelets 138,000, WBC 4,700. BUN was 41 and creatinine 2.11 (CrCl 24 ml/min). Urinalysis revealed moderate leukocytes and many bacteria. Magnesium was 1.1. She received 3 g IV magnesium.  Renal ultrasound on 01/24/2020 revealed increased cortical echogenicity, in keeping with medical renal disease. There was no hydronephrosis. Postvoid bladder residual 63 mL.  The patient was exposed to COVID-19 by her great niece. She went to the ER on 01/26/2020 and tested negative twice.  The patient saw Dr Sarah [name] on 02/01/2020.  She was felt to be a reasonable candidate for second autologous stem cell transplant.  She was noted to have a very nice response to daratumumab, Pomalyst and Decadron with enough disease control to proceed.  From a functional perspective, she was not ready to proceed because of her fatigue, weight loss and relatively recent hospitalizations in the setting of challenges with treatment tolerance.  She was felt to have limited reserve.  Recommendation was to work with nutrition and physical therapy over the next 3 months followed by reevaluation.  If her weight stabilizes and she improves in strength and conditioning with physical therapy, then transplant will worse proceed in 3 months.    Labs have been followed: 01/25/2020: Magnesium 2.0.  Creatinine 1.34 (CrCl 42 ml/min). Calcium 8.6.  01/27/2020: Magnesium 1.5. Creatinine 0.96. 01/31/2020: Magnesium 1.2. Creatinine 1.13 (CrCl 52 ml/min). Hematocrit 31.2, hemoglobin 10.9, MCV 89.1, platelets 144,000, WBC 4,100.  The patient received 2 g IV Magnesium on 01/27/2020 and 6 g IV Magnesium on 01/31/2020.  During the interim, she has been "fine." Her nausea is a little bit worse and occurs after eating. She still has abdominal pain in certain areas and has some new pain underneath her breasts. Her other symptoms are stable. She is seeing a physical therapist tomorrow and Sarah [name], RD on 02/10/2020.  The patient took Pomalyst, Singulair, Tylenol, and Benadryl this morning. She takes Acyclovir as prescribed.   Past Medical History:  Diagnosis Date  . Abnormal stress test 02/14/2016   Overview:  Added automatically from request for surgery [id number]  . Anemia   . Anxiety   . Arthritis   . Bicuspid aortic valve   . CHF (congestive heart failure) ([place])   . CKD (chronic kidney disease) stage 3, GFR 30-59 ml/min   . Depression   . Diabetes mellitus ([place])   . Dizziness   . Fatty liver   . Frequent falls   . GERD (gastroesophageal reflux disease)   . Gout   . Heart murmur   . History of blood transfusion   . History of bone marrow transplant ([place])   . History of uterine fibroid   . Hx of cardiac catheterization 06/05/2016   Overview:  Normal coronaries 2017  . Hypertension   . Hypomagnesemia   . Multiple myeloma ([place])   . Personal history of chemotherapy   .  Renal cyst     Past Surgical History:  Procedure Laterality Date  . ABDOMINAL HYSTERECTOMY    . 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 CARDIAC ELECTROPHYSIOLOGY MAPPING AND ABLATION    . CARPAL TUNNEL RELEASE Bilateral   . CHOLECYSTECTOMY  2008  . COLONOSCOPY WITH PROPOFOL N/A 05/07/2017   Procedure: COLONOSCOPY WITH PROPOFOL;  Surgeon: Sarah Bellows, MD;  Location: [facility] ENDOSCOPY;  Service: Gastroenterology;   Laterality: N/A;  . ESOPHAGOGASTRODUODENOSCOPY (EGD) WITH PROPOFOL N/A 05/07/2017   Procedure: ESOPHAGOGASTRODUODENOSCOPY (EGD) WITH PROPOFOL;  Surgeon: Sarah Bellows, MD;  Location: [facility] ENDOSCOPY;  Service: Gastroenterology;  Laterality: N/A;  . FOOT SURGERY Bilateral   . INCONTINENCE SURGERY  2009  . INTERSTIM IMPLANT PLACEMENT    . other     over active bladder  . OTHER SURGICAL HISTORY     bladder stimulator   . PARTIAL HYSTERECTOMY  03/1996   fibroids  . PORTA CATH INSERTION N/A 03/10/2019   Procedure: PORTA CATH INSERTION;  Surgeon: Sarah Huxley, MD;  Location: [facility];  Service: Cardiovascular;  Laterality: N/A;  . TONSILLECTOMY  2007    Family History  Problem Relation Age of Onset  . Colon cancer Father   . Renal Disease Father   . Diabetes Mellitus II Father   . Melanoma Paternal Grandmother   . Breast cancer Maternal Aunt 81  . Anemia Mother   . Heart disease Mother   . Heart failure Mother   . Renal Disease Mother   . Congestive Heart Failure Mother   . Heart disease Maternal Uncle   . Throat cancer Maternal Uncle   . Lung cancer Maternal Uncle   . Liver disease Maternal Uncle   . Heart failure Maternal Uncle   . Hearing loss Son 104       Suicide     Social History:  reports that she quit smoking about 28 years ago. Her smoking use included cigarettes. She has a 20.00 pack-year smoking history. She has never used smokeless tobacco. She reports previous alcohol use. She reports that she does not use drugs. Patient has not had ETOH in several months. She is on disability. She notes exposure to perchloroethylene [facility]).She lives much of her adult life in [place]. She was married with 2 sons. Her husband passed away. Her 2 sons took their own lives (age 70 and 67). She worked in [employer] in [employer]. She went back to school and earned an [employer] and works for [employer] for several years.She [place] sistersin [place]. The  patient is alone today.  Allergies:  Allergies  Allergen Reactions  . Oxycodone-Acetaminophen Anaphylaxis    Swelling and rash  . Celebrex [Celecoxib] Diarrhea  . Codeine   . Plerixafor     In 2016 during ASCT collection patient developed fever to 103.66F and required hospitalization  . Benadryl [Diphenhydramine] Palpitations  . Morphine Itching and Rash  . Ondansetron Diarrhea  . Tylenol [Acetaminophen] Itching and Rash    Current Medications: Current Outpatient Medications  Medication Sig Dispense Refill  . acetaminophen (TYLENOL) 500 MG tablet Take 500 mg by mouth every 6 (six) hours as needed.     Marland Kitchen acyclovir (ZOVIRAX) 400 MG tablet Take 1 tablet (400 mg total) by mouth 2 (two) times daily. 60 tablet 11  . allopurinol (ZYLOPRIM) 100 MG tablet TAKE 1 TABLET(100 MG) BY MOUTH DAILY as needed 90 tablet 1  . aspirin 81 MG chewable tablet Chew 1 tablet (81 mg total)  by mouth daily. 30 tablet 0  . diclofenac sodium (VOLTAREN) 1 % GEL Apply 2 g topically 4 (four) times daily. 100 g 1  . diphenhydrAMINE (BENADRYL) 50 MG capsule Take 50 mg by mouth as needed.     . diphenoxylate-atropine (LOMOTIL) 2.5-0.025 MG tablet Take 2 tablets by mouth daily as needed for diarrhea or loose stools. 45 tablet 2  . fentaNYL (DURAGESIC - DOSED MCG/HR) 25 MCG/HR patch Place 25 mcg onto the skin every 3 (three) days.     . fexofenadine (ALLEGRA) 180 MG tablet TAKE 1 TABLET(180 MG) BY MOUTH DAILY 30 tablet 5  . fluconazole (DIFLUCAN) 150 MG tablet Take 1 tablet (150 mg total) by mouth daily. 1 tablet 0  . FLUoxetine (PROZAC) 40 MG capsule TAKE 1 CAPSULE EVERY DAY 90 capsule 1  . fluticasone (FLONASE) 50 MCG/ACT nasal spray Place 2 sprays into both nostrils daily. 16 g 2  . furosemide (LASIX) 20 MG tablet Take 20 mg by mouth as needed.     Marland Kitchen glucose blood (ONE TOUCH ULTRA TEST) test strip     . hydrOXYzine (ATARAX/VISTARIL) 10 MG tablet TAKE 1 TABLET(10 MG) BY MOUTH THREE TIMES DAILY AS NEEDED 90 tablet 0  .  lactulose (CHRONULAC) 10 GM/15ML solution     . lidocaine-prilocaine (EMLA) cream APPLY TOPICALLY AS NEEDED. 30 g 0  . lisinopril (ZESTRIL) 10 MG tablet Take 10 mg by mouth once.    . metFORMIN (GLUCOPHAGE-XR) 500 MG 24 hr tablet TAKE 1 TABLET (500 MG TOTAL) BY MOUTH DAILY WITH BREAKFAST. 90 tablet 1  . Misc Natural Products (OSTEO BI-FLEX ADV TRIPLE ST PO) Take 2 tablets by mouth daily.    . montelukast (SINGULAIR) 10 MG tablet Take 1 tablet by mouth on the day before treatment and for 2 days after treatment. 30 tablet 0  . Multiple Vitamins-Minerals (HAIR/SKIN/NAILS/BIOTIN PO) Take 1 capsule by mouth 3 (three) times daily.    Marland Kitchen NARCAN 4 MG/0.1ML LIQD nasal spray kit 1 spray.     Marland Kitchen omeprazole (PRILOSEC) 40 MG capsule TAKE 1 CAPSULE EVERY DAY 90 capsule 3  . pomalidomide (POMALYST) 3 MG capsule TAKE 1 CAPSULE BY MOUTH EVERY DAY   Rem's # [id number] Date obtained: 01/05/2020 21 capsule 0  . promethazine (PHENERGAN) 25 MG tablet Take 1 tablet (25 mg total) by mouth every 6 (six) hours as needed for nausea or vomiting. 90 tablet 0  . promethazine-dextromethorphan (PROMETHAZINE-DM) 6.25-15 MG/5ML syrup Take 5 mLs by mouth 4 (four) times daily as needed for cough. 240 mL 0  . tiZANidine (ZANAFLEX) 4 MG tablet Take 1 tablet (4 mg total) by mouth every 8 (eight) hours as needed for muscle spasms. 270 tablet 1  . traZODone (DESYREL) 100 MG tablet TAKE 1 TABLET AT BEDTIME  FOR  SLEEP 90 tablet 0  . dexamethasone (DECADRON) 4 MG tablet Take 5 tablets (22m) on days 2, 9, 16, 23 of cycle 1 and 2 Take 5 tablets (273m on days 2, 16 of cycle 3,4,5 and 6 Take 5 tablets (2018mon days 2 of cycles 7 and beyond (Patient not taking: Reported on 02/07/2020) 120 tablet 0  . Omega 3-6-9 Fatty Acids (OMEGA 3-6-9 COMPLEX) CAPS Take by mouth. (Patient not taking: Reported on 02/07/2020)     No current facility-administered medications for this visit.   Facility-Administered Medications Ordered in Other Visits  Medication  Dose Route Frequency Provider Last Rate Last Admin  . 0.9 %  sodium chloride infusion   Intravenous Continuous Corcoran,  Drue Second, MD   Stopped at 01/20/20 1232  . sodium chloride flush (NS) 0.9 % injection 10 mL  10 mL Intravenous PRN Lequita Asal, MD   10 mL at 02/07/20 0815    Review of Systems  Constitutional: Positive for diaphoresis (night sweats) and malaise/fatigue. Negative for chills, fever and weight loss (stable).       Feels "fine."  HENT: Positive for congestion. Negative for ear discharge, ear pain, hearing loss, nosebleeds, sinus pain, sore throat and tinnitus.   Eyes: Negative.  Negative for blurred vision.  Respiratory: Negative.  Negative for cough, hemoptysis, sputum production, shortness of breath and wheezing.   Cardiovascular: Negative.  Negative for chest pain, palpitations and leg swelling.  Gastrointestinal: Positive for abdominal pain, nausea (after eating) and vomiting. Negative for blood in stool, constipation, diarrhea, heartburn (on Prilosec) and melena.       Drinking lots of water, always thirsty.  Genitourinary: Negative for dysuria, frequency, hematuria and urgency.  Musculoskeletal: Negative for back pain (chronic; paraspinal tenderness; upper back), falls, joint pain, myalgias and neck pain.       Pain underneath breasts.  Skin: Negative for itching and rash.  Neurological: Positive for weakness. Negative for dizziness, tingling, sensory change (chronic numbness in fingers and toes, stable) and headaches.  Endo/Heme/Allergies: Negative for environmental allergies. Does not bruise/bleed easily.       Diabetes, under good control.  Psychiatric/Behavioral: Negative.  Negative for depression and memory loss. The patient is not nervous/anxious and does not have insomnia.   All other systems reviewed and are negative.   Performance status (ECOG): 1  Vitals Blood pressure 125/71, pulse 80, temperature (!) 96.5 F (35.8 C), temperature source Tympanic,  resp. rate 16, weight 141 lb 6.8 oz (64.1 kg), SpO2 98 %.  Physical Exam Vitals and nursing note reviewed.  Constitutional:      General: She is not in acute distress.    Appearance: She is well-developed. She is not diaphoretic.     Interventions: Face mask in place.  HENT:     Head: Normocephalic and atraumatic.     Comments: Short curly blonde hair.    Mouth/Throat:     Mouth: Mucous membranes are dry.     Pharynx: Oropharynx is clear.  Eyes:     General: No scleral icterus.    Conjunctiva/sclera: Conjunctivae normal.     Pupils: Pupils are equal, round, and reactive to light.     Comments: Glasses. Brown eyes.   Cardiovascular:     Rate and Rhythm: Normal rate and regular rhythm.     Heart sounds: Normal heart sounds. No murmur heard.   Pulmonary:     Effort: Pulmonary effort is normal. No respiratory distress.     Breath sounds: Normal breath sounds. No wheezing or rales.  Chest:     Chest wall: No tenderness.  Abdominal:     General: Bowel sounds are normal. There is no distension.     Palpations: Abdomen is soft. There is no hepatomegaly, splenomegaly or mass.     Tenderness: There is no abdominal tenderness. There is no left CVA tenderness, guarding or rebound.  Musculoskeletal:        General: Tenderness (ankles) present. No swelling. Normal range of motion.     Cervical back: Normal range of motion and neck supple.  Lymphadenopathy:     Head:     Right side of head: No preauricular, posterior auricular or occipital adenopathy.     Left side  of head: No preauricular, posterior auricular or occipital adenopathy.     Cervical: No cervical adenopathy.     Upper Body:     Right upper body: No supraclavicular adenopathy.     Left upper body: No supraclavicular adenopathy.     Lower Body: No right inguinal adenopathy. No left inguinal adenopathy.  Skin:    General: Skin is warm and dry.  Neurological:     Mental Status: She is alert and oriented to person, place, and  time. Mental status is at baseline.  Psychiatric:        Mood and Affect: Mood normal.        Behavior: Behavior normal.        Thought Content: Thought content normal.        Judgment: Judgment normal.    Infusion on 02/07/2020  Component Date Value Ref Range Status  . WBC 02/07/2020 3.6* 4.0 - 10.5 K/uL Final  . RBC 02/07/2020 3.74* 3.87 - 5.11 MIL/uL Final  . Hemoglobin 02/07/2020 11.5* 12.0 - 15.0 g/dL Final  . HCT 02/07/2020 33.4* 36 - 46 % Final  . MCV 02/07/2020 89.3  80.0 - 100.0 fL Final  . MCH 02/07/2020 30.7  26.0 - 34.0 pg Final  . MCHC 02/07/2020 34.4  30.0 - 36.0 g/dL Final  . RDW 02/07/2020 12.9  11.5 - 15.5 % Final  . Platelets 02/07/2020 170  150 - 400 K/uL Final  . nRBC 02/07/2020 0.0  0.0 - 0.2 % Final  . Neutrophils Relative % 02/07/2020 47  % Final  . Neutro Abs 02/07/2020 1.7  1.7 - 7.7 K/uL Final  . Lymphocytes Relative 02/07/2020 35  % Final  . Lymphs Abs 02/07/2020 1.3  0.7 - 4.0 K/uL Final  . Monocytes Relative 02/07/2020 11  % Final  . Monocytes Absolute 02/07/2020 0.4  0 - 1 K/uL Final  . Eosinophils Relative 02/07/2020 6  % Final  . Eosinophils Absolute 02/07/2020 0.2  0 - 0 K/uL Final  . Basophils Relative 02/07/2020 1  % Final  . Basophils Absolute 02/07/2020 0.1  0 - 0 K/uL Final  . Immature Granulocytes 02/07/2020 0  % Final  . Abs Immature Granulocytes 02/07/2020 0.01  0.00 - 0.07 K/uL Final   Performed at [facility], [address], [place], [place] [address]  . Sodium 02/07/2020 137  135 - 145 mmol/L Final  . Potassium 02/07/2020 3.7  3.5 - 5.1 mmol/L Final  . Chloride 02/07/2020 102  98 - 111 mmol/L Final  . CO2 02/07/2020 26  22 - 32 mmol/L Final  . Glucose, Bld 02/07/2020 158* 70 - 99 mg/dL Final   Glucose reference range applies only to samples taken after fasting for at least 8 hours.  . BUN 02/07/2020 21  8 - 23 mg/dL Final  . Creatinine, Ser 02/07/2020 1.06* 0.44 - 1.00 mg/dL Final  . Calcium 02/07/2020 9.2  8.9 - 10.3  mg/dL Final  . Total Protein 02/07/2020 7.1  6.5 - 8.1 g/dL Final  . Albumin 02/07/2020 4.2  3.5 - 5.0 g/dL Final  . AST 02/07/2020 23  15 - 41 U/L Final  . ALT 02/07/2020 22  0 - 44 U/L Final  . Alkaline Phosphatase 02/07/2020 76  38 - 126 U/L Final  . Total Bilirubin 02/07/2020 0.4  0.3 - 1.2 mg/dL Final  . GFR calc non Af Amer 02/07/2020 56* >60 mL/min Final  . GFR calc Af Amer 02/07/2020 >60  >60 mL/min Final  .  Anion gap 02/07/2020 9  5 - 15 Final   Performed at [facility], [place], [address] [place], [place] [address]  . Magnesium 02/07/2020 1.1* 1.7 - 2.4 mg/dL Final   Performed at [facility], [address], [place], [place] [address]    Assessment:  Sarah [name] is a 64 y.o. 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 Bone marrow revealed 80% plasma cells. Lambda free light chains were 1340. She had nephrotic range proteinuria. She initially underwent induction with RVD. Revlimid maintenance was discontinued on 01/21/2017 secondary to intolerance.   Bone marrowaspirate andbiopsyon 01/18/2021revealed anormocellularmarrow withbut increased lambda-restricted plasma cells (9% aspirate, 40% CD138 immunohistochemistry).Findingswereconsistent with recurrent plasma cell myeloma.Flow cytometry revealed no monoclonal B-cell or phenotypically aberrant T-cell population. Cytogeneticswere 85, XX (normal). 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  M-spikehas been followed: 0 on 04/02/2016 -06/23/2019; 0.2 on 09/02/2017, 0.1 on 11/25/2019, and 0.1 on 01/10/2020.  Lambda light chainshave been followed: 22.2 (ratio 0.56) on 07/03/2017, 30.8 (ratio 0.78) on 09/02/2017, 36.9 (ratio 0.40) on 10/21/2017, 37.4 (ratio 0.41) on 12/16/2017, 70.7(ratio 0.31) on 02/17/2018, 64.2 (ratio 0.27) on 04/07/2018, 78.9 (ratio 0.18) on 05/26/2018,  128.8 (ratio 0.17) on 08/06/2018, 181.5 (ratio 0.13) on 10/08/2018, 130.9 (ratio 0.13) on 10/20/2018, 160.7 (ratio 0.10)on 12/09/2018, 236.6 (ratio 0.07) on 02/01/2019, 363.6 (ratio 0.04) on 03/22/2019, 404.8 (ratio 0.04) on 04/05/2019, 420.7 (ratio 0.03) on 05/24/2019, 573.4 (ratio 0.03) on 06/23/2019, 451.05(ratio 0.02) on02/19/2021, 47.2 (ratio 0.13) on 10/25/2019, 22.4 (ratio 0.21) on 11/25/2019, 16.5 (ratio 0.33) on 01/10/2020, and 14.6 (ratio 0.34) on 02/07/2020.  24 hour UPEPon 06/03/2019 revealed kappa free light chains95.76,lambda free light chains1,260.71, andratio 0.08.24 hourUPEPon 02/22/2021revealed totalproteinof752m/24 hrs withlambdafree light chains 1,084.114mL andratioof0.10(1.03-31.76). M spike inurinewas46.1%(36160m4 hrs).  Bone surveyon 04/08/2016 and11/28/2018 revealed no definite lytic lesion seen in the visualized skeleton.Bone surveyon 11/19/2018 revealed no suspicious lucent lesionsand no acute bony abnormality.PET scanon 07/12/2019 revealed no focal metabolic activity to suggest active myeloma within the skeleton. There wereno lytic lesions identified on the CT portion of the examorsoft tissue plasmacytomas. There was no evidence of multiple myeloma.  Pretreatment RBC phenotypeon 09/23/2019 waspositivefor C, e, DUFFY B, KIDD B, M, S, and s antigen; negativefor c, E, KELL, DUFFY A, KIDD A, and N antigen.  Sheis day 1 ofcycle #5 daratumumab and hyaluronidase-fihj, Pomalyst, and Decadron (DPd)(09/27/2019 - 10/25/2019; 12/09/2019 - 02/07/2020). Cycle #1 was complicated by fever and neutropenia requiring admission.  Cycle #2 was complicated by pneumonia requiring admission.  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 Zometa was discontinued in 01/2017. She has chronic nauseaon Phenergan.  She has B12 deficiency. B12 was 254 on 04/09/2017, 295 on 08/20/2018, and 391 on 10/08/2018. Shewasonoral  B12.She received B12 monthly (last07/15/2021).Folate was18.6on 02/08/2019.  She has iron deficiency. Ferritin was 32 on 07/01/2019. She received Venoferon 07/15/2019 and 07/22/2019.  She has hypogammaglobulinemia.  IgG was 245 on 11/25/2019.  She received IVIG 400 mg/kg on 01/20/2020.  IVIG was complicated by acute renal failure.  [facility] [place] [facility] for fever and neutropenia. Cultures were negative. CXR was negative. She received broad spectrum antibiotics and daily Granix. She received IVF for acute renal insufficiency due to diarrhea and dehydration. Creatine was 1.66 on admission and 1.12 on discharge.  She received her second COVID-19vaccineon 10/09/2019.  Symptomatically, she feels "fine".  She has chronic nausea which appears slightly worse.  She is fatigued and plans to see physical therapy in preparation for autologous stem cell transplant.  Exam is stable.  Plan: 1.   Labs today: CBC with diff, CMP, Mg, SPEP, FLCA. 2. Stage III multiple myeloma Clinically, she is doing fairly well. Lambda free light chains have improved from451.05on02/19/2021.   After cycle #1:  47.2.   After cycle #2:  22.4.   After cycle #3:  16.5.   After cycle #4:  14.6. Labs reviewed.  Begin day 1 of cycle #5 daratumumab plus Pomalyst and dexamethasone (DPd). Neuropathy remains stable.   Course has been complicated by fatigue.   Low counts have required adjust adjustment in her Pomalyst to 2 weeks on and 2 weeks off.             Treatment plan for cycles # 4 - 6 (28 day cycle):                          Pomalidomide (Pomalyst) 3 mg po q day 1-14.                         Decadron 20 mg po day 1-2 and 15-16. Daratumumab and hyaluronidase-fihj (Darzalez Faspro) 1800 mg SQ on days 1 and 15. Premeds for [employer]:   Singulair 10 mg po day before, day of, and 2 days. Phenergan 25 mg IV,Benadryl 50 mg po, andTylenol 650 mg po.  Patient took her premeds today.  Discuss symptom management.  She has antiemetics at home to use on a prn bases.  Interventions are adequate.   Refill Phenergan and Decadron.    Discuss continuation of therapy for additional 3 months prior to autologous stem cell transplant in order to build up strength, endurance, and weight.  3. Normocytic anemia Hematocrit33.4. Hemoglobin9.5. MCV89.3.Platelets170,000today. Ferritin51 on 01/10/2020. TSH was normal on02/20/2020.  Creatinine 1.06(CrCl48.54m/minute). Continue to monitor. 4. Grade II peripheral neuropathy  Neuropathy may remain stable.  Continue to monitor 5.B12 deficiency She receives B12 monthly (last 01/13/2020). Next B12 due on 02/10/2020.    Folate 12.1 on 01/10/2020.  Monitor folate annually. 6. Acute renal insufficiency, resolved  Creatinine 1.04 on 01/17/2020 and 2.11 on 01/24/2020.  Etiology felt secondary to IVIG.   IVIG has been associated with renal dysfunction and acute renal failure.   Per Up-to-Date, in patients at risk for renal dysfunction or acute renal failure, ensure adequate hydration prior to administration.   The dose, rate of infusion and concentration should be minimized.  Renal ultrasound revealed no hydronephrosis.  Patient seen by Dr. LHolley Raringon 01/25/2020.  Notes reviewed.   He suspects she was dehydrated.  He recommends hydration 1 day after IVIG.  Discuss decreasing dose, extending infusion time, and adding hydration.   Discuss advancing dose as tolerated to achieve a trough IgG level of 500. 7.   Hypogammaglobulinemia  Patient has had recurrent infections.    Patient received  IVIG on 01/20/2020 with subsequent renal insufficiency.  Plan monthly IVIG to maintain an IgG trough of 500.   As above, dose is being adjusted and advanced as tolerated.  Monitor trough IgG level monthly. 8.   Hypomagnesemia, chronic Magnesium1.1 today. Magnesium 6 g IV today.  She receives half dose if she has renal insufficiency. Continue IV magnesium supplementation twice weekly (Mondays and Thursdays).   9.Nausea  Chronic nausea is fairly well controlled with oral Phenergan.  Refill Phenergan today.  Nausea is better controlled with IV  Phenergan per patient.  Continue Phenergan IV twice weekly when patient returns for treatment. 10.   Day 1 of cycle #5 daratumumab. 11.   Magnesium sulfate 6 gm IV. 12.   RTC on 08/12 for labs (Mg) and IV magnesium and B12. 13.   RTC on 08/16 for labs (CBC with diff, Mg) and IV Mg. 14.   RTC on 08/19 for labs (BMP, Mg, IgG level), and IV Mg and IVIG. 15.   RTC on 08/23 for MD assessment, labs (CBC with diff, CMP, Mg) and day 15 of cycle #5 daratumumab. 16.   Patient needs her appt with nutrition Sarah [name]) virtual tomorrow rather than in person.  I discussed the assessment and treatment plan with the patient.  The patient was provided an opportunity to ask questions and all were answered.  The patient agreed with the plan and demonstrated an understanding of the instructions.  The patient was advised to call back if the symptoms worsen or if the condition fails to improve as anticipated.   Lequita Asal, MD, PhD    02/07/2020, 9:06 AM   I, De Burrs, am acting as Education administrator for [employer]. Mike Gip, MD, PhD.  I, Melissa C. Mike Gip, MD, have reviewed the above documentation for accuracy and completeness, and I agree with the above.

## 2020-02-07 ENCOUNTER — Inpatient Hospital Stay: Payer: Medicare PPO

## 2020-02-07 ENCOUNTER — Encounter: Payer: Self-pay | Admitting: Hematology and Oncology

## 2020-02-07 ENCOUNTER — Other Ambulatory Visit: Payer: Medicare PPO

## 2020-02-07 ENCOUNTER — Other Ambulatory Visit: Payer: Self-pay

## 2020-02-07 ENCOUNTER — Ambulatory Visit (INDEPENDENT_AMBULATORY_CARE_PROVIDER_SITE_OTHER): Payer: Medicare PPO

## 2020-02-07 ENCOUNTER — Ambulatory Visit: Payer: Medicare PPO

## 2020-02-07 ENCOUNTER — Ambulatory Visit: Payer: Medicare PPO | Admitting: Hematology and Oncology

## 2020-02-07 ENCOUNTER — Inpatient Hospital Stay (HOSPITAL_BASED_OUTPATIENT_CLINIC_OR_DEPARTMENT_OTHER): Payer: Medicare PPO | Admitting: Hematology and Oncology

## 2020-02-07 VITALS — BP 116/71 | HR 78 | Resp 16

## 2020-02-07 VITALS — BP 125/71 | HR 80 | Temp 96.5°F | Resp 16 | Wt 141.4 lb

## 2020-02-07 DIAGNOSIS — Z9484 Stem cells transplant status: Secondary | ICD-10-CM | POA: Diagnosis not present

## 2020-02-07 DIAGNOSIS — D801 Nonfamilial hypogammaglobulinemia: Secondary | ICD-10-CM | POA: Diagnosis not present

## 2020-02-07 DIAGNOSIS — C9000 Multiple myeloma not having achieved remission: Secondary | ICD-10-CM | POA: Diagnosis not present

## 2020-02-07 DIAGNOSIS — E639 Nutritional deficiency, unspecified: Secondary | ICD-10-CM | POA: Insufficient documentation

## 2020-02-07 DIAGNOSIS — R531 Weakness: Secondary | ICD-10-CM | POA: Diagnosis not present

## 2020-02-07 DIAGNOSIS — T451X5A Adverse effect of antineoplastic and immunosuppressive drugs, initial encounter: Secondary | ICD-10-CM

## 2020-02-07 DIAGNOSIS — Z0000 Encounter for general adult medical examination without abnormal findings: Secondary | ICD-10-CM

## 2020-02-07 DIAGNOSIS — R5381 Other malaise: Secondary | ICD-10-CM | POA: Diagnosis not present

## 2020-02-07 DIAGNOSIS — D649 Anemia, unspecified: Secondary | ICD-10-CM | POA: Diagnosis not present

## 2020-02-07 DIAGNOSIS — Z599 Problem related to housing and economic circumstances, unspecified: Secondary | ICD-10-CM

## 2020-02-07 DIAGNOSIS — Z598 Other problems related to housing and economic circumstances: Secondary | ICD-10-CM

## 2020-02-07 DIAGNOSIS — R110 Nausea: Secondary | ICD-10-CM

## 2020-02-07 DIAGNOSIS — G620 Drug-induced polyneuropathy: Secondary | ICD-10-CM

## 2020-02-07 DIAGNOSIS — G629 Polyneuropathy, unspecified: Secondary | ICD-10-CM | POA: Diagnosis not present

## 2020-02-07 DIAGNOSIS — E538 Deficiency of other specified B group vitamins: Secondary | ICD-10-CM | POA: Diagnosis not present

## 2020-02-07 DIAGNOSIS — Z5112 Encounter for antineoplastic immunotherapy: Secondary | ICD-10-CM | POA: Diagnosis not present

## 2020-02-07 DIAGNOSIS — R61 Generalized hyperhidrosis: Secondary | ICD-10-CM | POA: Diagnosis not present

## 2020-02-07 LAB — CBC WITH DIFFERENTIAL/PLATELET
Abs Immature Granulocytes: 0.01 10*3/uL (ref 0.00–0.07)
Basophils Absolute: 0.1 10*3/uL (ref 0.0–0.1)
Basophils Relative: 1 %
Eosinophils Absolute: 0.2 10*3/uL (ref 0.0–0.5)
Eosinophils Relative: 6 %
HCT: 33.4 % — ABNORMAL LOW (ref 36.0–46.0)
Hemoglobin: 11.5 g/dL — ABNORMAL LOW (ref 12.0–15.0)
Immature Granulocytes: 0 %
Lymphocytes Relative: 35 %
Lymphs Abs: 1.3 10*3/uL (ref 0.7–4.0)
MCH: 30.7 pg (ref 26.0–34.0)
MCHC: 34.4 g/dL (ref 30.0–36.0)
MCV: 89.3 fL (ref 80.0–100.0)
Monocytes Absolute: 0.4 10*3/uL (ref 0.1–1.0)
Monocytes Relative: 11 %
Neutro Abs: 1.7 10*3/uL (ref 1.7–7.7)
Neutrophils Relative %: 47 %
Platelets: 170 10*3/uL (ref 150–400)
RBC: 3.74 MIL/uL — ABNORMAL LOW (ref 3.87–5.11)
RDW: 12.9 % (ref 11.5–15.5)
WBC: 3.6 10*3/uL — ABNORMAL LOW (ref 4.0–10.5)
nRBC: 0 % (ref 0.0–0.2)

## 2020-02-07 LAB — COMPREHENSIVE METABOLIC PANEL
ALT: 22 U/L (ref 0–44)
AST: 23 U/L (ref 15–41)
Albumin: 4.2 g/dL (ref 3.5–5.0)
Alkaline Phosphatase: 76 U/L (ref 38–126)
Anion gap: 9 (ref 5–15)
BUN: 21 mg/dL (ref 8–23)
CO2: 26 mmol/L (ref 22–32)
Calcium: 9.2 mg/dL (ref 8.9–10.3)
Chloride: 102 mmol/L (ref 98–111)
Creatinine, Ser: 1.06 mg/dL — ABNORMAL HIGH (ref 0.44–1.00)
GFR calc Af Amer: >60 mL/min (ref >60)
GFR calc non Af Amer: 56 mL/min — ABNORMAL LOW (ref >60)
Glucose, Bld: 158 mg/dL — ABNORMAL HIGH (ref 70–99)
Potassium: 3.7 mmol/L (ref 3.5–5.1)
Sodium: 137 mmol/L (ref 135–145)
Total Bilirubin: 0.4 mg/dL (ref 0.3–1.2)
Total Protein: 7.1 g/dL (ref 6.5–8.1)

## 2020-02-07 LAB — MAGNESIUM: Magnesium: 1.1 mg/dL — ABNORMAL LOW (ref 1.7–2.4)

## 2020-02-07 MED ORDER — DARATUMUMAB-HYALURONIDASE-FIHJ 1800-30000 MG-UT/15ML ~~LOC~~ SOLN
1800.0000 mg | Freq: Once | SUBCUTANEOUS | Status: AC
  Administered 2020-02-07: 1800 mg via SUBCUTANEOUS
  Filled 2020-02-07: qty 15

## 2020-02-07 MED ORDER — PROMETHAZINE HCL 25 MG PO TABS: 25.0000 mg | ORAL_TABLET | Freq: Four times a day (QID) | ORAL | 0 refills | Status: DC | PRN

## 2020-02-07 MED ORDER — DEXAMETHASONE 4 MG PO TABS
20.0000 mg | ORAL_TABLET | Freq: Once | ORAL | Status: AC
  Administered 2020-02-07: 20 mg via ORAL
  Filled 2020-02-07: qty 5

## 2020-02-07 MED ORDER — SODIUM CHLORIDE 0.9 % IV SOLN
Freq: Once | INTRAVENOUS | Status: AC
  Filled 2020-02-07: qty 250

## 2020-02-07 MED ORDER — PROMETHAZINE HCL 25 MG/ML IJ SOLN
25.0000 mg | Freq: Once | INTRAMUSCULAR | Status: AC
  Administered 2020-02-07: 25 mg via INTRAVENOUS
  Filled 2020-02-07: qty 1

## 2020-02-07 MED ORDER — SODIUM CHLORIDE 0.9% FLUSH
10.0000 mL | INTRAVENOUS | Status: DC | PRN
  Administered 2020-02-07: 10 mL via INTRAVENOUS
  Filled 2020-02-07: qty 10

## 2020-02-07 MED ORDER — SODIUM CHLORIDE 0.9 % IV SOLN
6.0000 g | Freq: Once | INTRAVENOUS | Status: AC
  Administered 2020-02-07: 6 g via INTRAVENOUS
  Filled 2020-02-07: qty 12

## 2020-02-07 MED ORDER — HEPARIN SOD (PORK) LOCK FLUSH 100 UNIT/ML IV SOLN
500.0000 [IU] | Freq: Once | INTRAVENOUS | Status: AC | PRN
  Administered 2020-02-07: 500 [IU]
  Filled 2020-02-07: qty 5

## 2020-02-07 NOTE — Patient Instructions (Signed)
Sarah [name] , Thank you for taking time to come for your Medicare Wellness Visit. I appreciate your ongoing commitment to your health goals. Please review the following plan we discussed and let me know if I can assist you in the future.   Screening recommendations/referrals: Colonoscopy: done 05/07/17 Mammogram: done 12/07/19 Bone Density: due at age 64 Recommended yearly ophthalmology/optometry visit for glaucoma screening and checkup Recommended yearly dental visit for hygiene and checkup  Vaccinations: Influenza vaccine: done 04/12/19 Pneumococcal vaccine: done 07/08/17 Tdap vaccine: done 02/12/18 Shingles vaccine: done 11/19/17 & 02/12/18 Covid-19: done 09/20/19 & 10/09/19   Advanced directives: Advance directive discussed with you today. Even though you declined this today please call our office should you change your mind and we can give you the proper paperwork for you to fill out.  Conditions/risks identified: Recommend drinking 6-8 glasses of water per day  Next appointment: Follow up in one year for your annual wellness visit.   Preventive Care 40-64 Years, Female Preventive care refers to lifestyle choices and visits with your health care provider that can promote health and wellness. What does preventive care include?  A yearly physical exam. This is also called an annual well check.  Dental exams once or twice a year.  Routine eye exams. Ask your health care provider how often you should have your eyes checked.  Personal lifestyle choices, including:  Daily care of your teeth and gums.  Regular physical activity.  Eating a healthy diet.  Avoiding tobacco and drug use.  Limiting alcohol use.  Practicing safe sex.  Taking low-dose aspirin daily starting at age 45.  Taking vitamin and mineral supplements as recommended by your health care provider. What happens during an annual well check? The services and screenings done by your health care provider during your  annual well check will depend on your age, overall health, lifestyle risk factors, and family history of disease. Counseling  Your health care provider may ask you questions about your:  Alcohol use.  Tobacco use.  Drug use.  Emotional well-being.  Home and relationship well-being.  Sexual activity.  Eating habits.  Work and work Astron[employer].  Method of birth control.  Menstrual cycle.  Pregnancy history. Screening  You may have the following tests or measurements:  Height, weight, and BMI.  Blood pressure.  Lipid and cholesterol levels. These may be checked every 5 years, or more frequently if you are over 66 years old.  Skin check.  Lung cancer screening. You may have this screening every year starting at age 53 if you have a 30-pack-year history of smoking and currently smoke or have quit within the past 15 years.  Fecal occult blood test (FOBT) of the stool. You may have this test every year starting at age 82.  Flexible sigmoidoscopy or colonoscopy. You may have a sigmoidoscopy every 5 years or a colonoscopy every 10 years starting at age 20.  Hepatitis C blood test.  Hepatitis B blood test.  Sexually transmitted disease (STD) testing.  Diabetes screening. This is done by checking your blood sugar (glucose) after you have not eaten for a while (fasting). You may have this done every 1-3 years.  Mammogram. This may be done every 1-2 years. Talk to your health care provider about when you should start having regular mammograms. This may depend on whether you have a family history of breast cancer.  BRCA-related cancer screening. This may be done if you have a family history of breast, ovarian, tubal, or peritoneal  cancers.  Pelvic exam and Pap test. This may be done every 3 years starting at age 21. Starting at age 30, this may be done every 5 years if you have a Pap test in combination with an HPV test.  Bone density scan. This is done to screen for  osteoporosis. You may have this scan if you are at high risk for osteoporosis. Discuss your test results, treatment options, and if necessary, the need for more tests with your health care provider. Vaccines  Your health care provider may recommend certain vaccines, such as:  Influenza vaccine. This is recommended every year.  Tetanus, diphtheria, and acellular pertussis (Tdap, Td) vaccine. You may need a Td booster every 10 years.  Zoster vaccine. You may need this after age 60.  Pneumococcal 13-valent conjugate (PCV13) vaccine. You may need this if you have certain conditions and were not previously vaccinated.  Pneumococcal polysaccharide (PPSV23) vaccine. You may need one or two doses if you smoke cigarettes or if you have certain conditions. Talk to your health care provider about which screenings and vaccines you need and how often you need them. This information is not intended to replace advice given to you by your health care provider. Make sure you discuss any questions you have with your health care provider. Document Released: 07/14/2015 Document Revised: 03/06/2016 Document Reviewed: 04/18/2015 Elsevier Interactive Patient Education  2017 [employer]    Fall Prevention in the Home Falls can cause injuries. They can happen to people of all ages. There are many things you can do to make your home safe and to help prevent falls. What can I do on the outside of my home?  Regularly fix the edges of walkways and driveways and fix any cracks.  Remove anything that might make you trip as you walk through a door, such as a raised step or threshold.  Trim any bushes or trees on the path to your home.  Use bright outdoor lighting.  Clear any walking paths of anything that might make someone trip, such as rocks or tools.  Regularly check to see if handrails are loose or broken. Make sure that both sides of any steps have handrails.  Any raised decks and porches should have  guardrails on the edges.  Have any leaves, snow, or ice cleared regularly.  Use sand or salt on walking paths during winter.  Clean up any spills in your garage right away. This includes oil or grease spills. What can I do in the bathroom?  Use night lights.  Install grab bars by the toilet and in the tub and shower. Do not use towel bars as grab bars.  Use non-skid mats or decals in the tub or shower.  If you need to sit down in the shower, use a plastic, non-slip stool.  Keep the floor dry. Clean up any water that spills on the floor as soon as it happens.  Remove soap buildup in the tub or shower regularly.  Attach bath mats securely with double-sided non-slip rug tape.  Do not have throw rugs and other things on the floor that can make you trip. What can I do in the bedroom?  Use night lights.  Make sure that you have a light by your bed that is easy to reach.  Do not use any sheets or blankets that are too big for your bed. They should not hang down onto the floor.  Have a firm chair that has side arms. You can use   this for support while you get dressed.  Do not have throw rugs and other things on the floor that can make you trip. What can I do in the kitchen?  Clean up any spills right away.  Avoid walking on wet floors.  Keep items that you use a lot in easy-to-reach places.  If you need to reach something above you, use a strong step stool that has a grab bar.  Keep electrical cords out of the way.  Do not use floor polish or wax that makes floors slippery. If you must use wax, use non-skid floor wax.  Do not have throw rugs and other things on the floor that can make you trip. What can I do with my stairs?  Do not leave any items on the stairs.  Make sure that there are handrails on both sides of the stairs and use them. Fix handrails that are broken or loose. Make sure that handrails are as long as the stairways.  Check any carpeting to make sure that  it is firmly attached to the stairs. Fix any carpet that is loose or worn.  Avoid having throw rugs at the top or bottom of the stairs. If you do have throw rugs, attach them to the floor with carpet tape.  Make sure that you have a light switch at the top of the stairs and the bottom of the stairs. If you do not have them, ask someone to add them for you. What else can I do to help prevent falls?  Wear shoes that:  Do not have high heels.  Have rubber bottoms.  Are comfortable and fit you well.  Are closed at the toe. Do not wear sandals.  If you use a stepladder:  Make sure that it is fully opened. Do not climb a closed stepladder.  Make sure that both sides of the stepladder are locked into place.  Ask someone to hold it for you, if possible.  Clearly mark and make sure that you can see:  Any grab bars or handrails.  First and last steps.  Where the edge of each step is.  Use tools that help you move around (mobility aids) if they are needed. These include:  Canes.  Walkers.  Scooters.  Crutches.  Turn on the lights when you go into a dark area. Replace any light bulbs as soon as they burn out.  Set up your furniture so you have a clear path. Avoid moving your furniture around.  If any of your floors are uneven, fix them.  If there are any pets around you, be aware of where they are.  Review your medicines with your doctor. Some medicines can make you feel dizzy. This can increase your chance of falling. Ask your doctor what other things that you can do to help prevent falls. This information is not intended to replace advice given to you by your health care provider. Make sure you discuss any questions you have with your health care provider. Document Released: 04/13/2009 Document Revised: 11/23/2015 Document Reviewed: 07/22/2014 Elsevier Interactive Patient Education  2017 [employer].

## 2020-02-07 NOTE — Progress Notes (Signed)
Patient here for oncology follow-up appointment, still some abdominal tenderness, otherwise expresses no complaints or concerns at this time.

## 2020-02-07 NOTE — Progress Notes (Signed)
Subjective:   Sarah [name] is a 64 y.o. female who presents for an Initial Medicare Annual Wellness Visit.  Virtual Visit via Telephone Note  I connected with  [name] on 02/07/20 at  3:20 PM EDT by telephone and verified that I am speaking with the correct person using two identifiers.  Medicare Annual Wellness visit completed telephonically due to Covid-19 pandemic.   Location: Patient: home Provider: [facility]   I discussed the limitations, risks, security and privacy concerns of performing an evaluation and management service by telephone and the availability of in person appointments. The patient expressed understanding and agreed to proceed.  Unable to perform video visit due to video visit attempted and failed and/or patient does not have video capability.   Some vital signs may be absent or patient reported.   Clemetine Marker, LPN    Review of Systems     Cardiac Risk Factors include: advanced age (>81mn, >>70women);diabetes mellitus;dyslipidemia;sedentary lifestyle     Objective:    Today's Vitals   02/07/20 1542  PainSc: 5    There is no height or weight on file to calculate BMI.  Advanced Directives 02/07/2020 01/21/2020 01/13/2020 01/10/2020 12/29/2019 12/22/2019 12/01/2019  Does Patient Have a Medical Advance Directive? No No No No No No No  Would patient like information on creating a medical advance directive? No - Patient declined No - Patient declined No - Patient declined No - Patient declined No - Patient declined No - Patient declined No - Patient declined    Current Medications (verified) Outpatient Encounter Medications as of 02/07/2020  Medication Sig  . acetaminophen (TYLENOL) 500 MG tablet Take 500 mg by mouth every 6 (six) hours as needed.   .Marland Kitchenacyclovir (ZOVIRAX) 400 MG tablet Take 1 tablet (400 mg total) by mouth 2 (two) times daily.  .Marland Kitchenallopurinol (ZYLOPRIM) 100 MG tablet TAKE 1 TABLET(100 MG) BY MOUTH DAILY as needed  . aspirin 81 MG chewable  tablet Chew 1 tablet (81 mg total) by mouth daily.  . diclofenac sodium (VOLTAREN) 1 % GEL Apply 2 g topically 4 (four) times daily.  . diphenhydrAMINE (BENADRYL) 50 MG capsule Take 50 mg by mouth as needed.   . diphenoxylate-atropine (LOMOTIL) 2.5-0.025 MG tablet Take 2 tablets by mouth daily as needed for diarrhea or loose stools.  . fentaNYL (DURAGESIC - DOSED MCG/HR) 25 MCG/HR patch Place 25 mcg onto the skin every 3 (three) days.   . fexofenadine (ALLEGRA) 180 MG tablet TAKE 1 TABLET(180 MG) BY MOUTH DAILY  . FLUoxetine (PROZAC) 40 MG capsule TAKE 1 CAPSULE EVERY DAY  . fluticasone (FLONASE) 50 MCG/ACT nasal spray Place 2 sprays into both nostrils daily.  . furosemide (LASIX) 20 MG tablet Take 20 mg by mouth as needed.   .Marland Kitchenglucose blood (ONE TOUCH ULTRA TEST) test strip   . hydrOXYzine (ATARAX/VISTARIL) 10 MG tablet TAKE 1 TABLET(10 MG) BY MOUTH THREE TIMES DAILY AS NEEDED  . lactulose (CHRONULAC) 10 GM/15ML solution   . lidocaine-prilocaine (EMLA) cream APPLY TOPICALLY AS NEEDED.  . metFORMIN (GLUCOPHAGE-XR) 500 MG 24 hr tablet TAKE 1 TABLET (500 MG TOTAL) BY MOUTH DAILY WITH BREAKFAST.  . montelukast (SINGULAIR) 10 MG tablet Take 1 tablet by mouth on the day before treatment and for 2 days after treatment.  . Multiple Vitamin (MULTIVITAMIN) tablet Take 1 tablet by mouth daily.  . Multiple Vitamins-Minerals (HAIR/SKIN/NAILS) TABS Take 1 tablet by mouth daily.  .Marland Kitchenomeprazole (PRILOSEC) 40 MG capsule TAKE 1 CAPSULE EVERY DAY  .  pomalidomide (POMALYST) 3 MG capsule TAKE 1 CAPSULE BY MOUTH EVERY DAY   Rem's # [id number] Date obtained: 01/05/2020  . promethazine (PHENERGAN) 25 MG tablet Take 1 tablet (25 mg total) by mouth every 6 (six) hours as needed for nausea or vomiting.  . promethazine-dextromethorphan (PROMETHAZINE-DM) 6.25-15 MG/5ML syrup Take 5 mLs by mouth 4 (four) times daily as needed for cough.  Marland Kitchen tiZANidine (ZANAFLEX) 4 MG tablet Take 1 tablet (4 mg total) by mouth every 8 (eight)  hours as needed for muscle spasms.  . traZODone (DESYREL) 100 MG tablet TAKE 1 TABLET AT BEDTIME  FOR  SLEEP  . dexamethasone (DECADRON) 4 MG tablet Take 5 tablets (2m) on days 2, 9, 16, 23 of cycle 1 and 2 Take 5 tablets (230m on days 2, 16 of cycle 3,4,5 and 6 Take 5 tablets (2044mon days 2 of cycles 7 and beyond (Patient not taking: Reported on 02/07/2020)  . lisinopril (ZESTRIL) 10 MG tablet Take 10 mg by mouth once. (Patient not taking: Reported on 02/07/2020)  . NARCAN 4 MG/0.1ML LIQD nasal spray kit 1 spray.   . [DISCONTINUED] fluconazole (DIFLUCAN) 150 MG tablet Take 1 tablet (150 mg total) by mouth daily.  . [DISCONTINUED] Misc Natural Products (OSTEO BI-FLEX ADV TRIPLE ST PO) Take 2 tablets by mouth daily.  . [DISCONTINUED] Multiple Vitamins-Minerals (HAIR/SKIN/NAILS/BIOTIN PO) Take 1 capsule by mouth 3 (three) times daily.  . [DISCONTINUED] Omega 3-6-9 Fatty Acids (OMEGA 3-6-9 COMPLEX) CAPS Take by mouth. (Patient not taking: Reported on 02/07/2020)  . [DISCONTINUED] promethazine (PHENERGAN) 25 MG tablet Take 1 tablet (25 mg total) by mouth every 6 (six) hours as needed for nausea or vomiting.   Facility-Administered Encounter Medications as of 02/07/2020  Medication  . 0.9 %  sodium chloride infusion  . [COMPLETED] 0.9 %  sodium chloride infusion  . [COMPLETED] daratumumab-hyaluronidase-fihj (DARZALEX FASPRO) 1800-30000 MG-UT/15ML chemo SQ injection 1,800 mg  . [COMPLETED] dexamethasone (DECADRON) tablet 20 mg  . [COMPLETED] magnesium sulfate 6 g in sodium chloride 0.9 % 500 mL  . [COMPLETED] promethazine (PHENERGAN) injection 25 mg  . sodium chloride flush (NS) 0.9 % injection 10 mL    Allergies (verified) Oxycodone-acetaminophen, Celebrex [celecoxib], Codeine, Plerixafor, Benadryl [diphenhydramine], Morphine, Ondansetron, and Tylenol [acetaminophen]   History: Past Medical History:  Diagnosis Date  . Abnormal stress test 02/14/2016   Overview:  Added automatically from request  for surgery [id number]  . Anemia   . Anxiety   . Arthritis   . Bicuspid aortic valve   . CHF (congestive heart failure) ([place] . CKD (chronic kidney disease) stage 3, GFR 30-59 ml/min   . Depression   . Diabetes mellitus ([facility] . Dizziness   . Fatty liver   . Frequent falls   . GERD (gastroesophageal reflux disease)   . Gout   . Heart murmur   . History of blood transfusion   . [place] of [place] ([place] . History of uterine fibroid   . Hx of cardiac catheterization 06/05/2016   Overview:  Normal coronaries 2017  . Hypertension   . Hypomagnesemia   . Multiple myeloma ([place] . Personal history of chemotherapy   . Renal cyst    Past Surgical History:  Procedure Laterality Date  . ABDOMINAL HYSTERECTOMY    . 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 CARDIAC ELECTROPHYSIOLOGY MAPPING AND ABLATION    . CARPAL TUNNEL RELEASE Bilateral   . CHOLECYSTECTOMY  2008  . COLONOSCOPY  WITH PROPOFOL N/A 05/07/2017   Procedure: COLONOSCOPY WITH PROPOFOL;  Surgeon: [name], MD;  [facility] END[facility]COPY;  Service: Gastroenterology;  Laterality: N/A;  . ESOPHAGOGASTRODUODENOSCOPY (EGD) WITH PROPOFOL N/A 05/07/2017   Procedure: ESOPHAGOGASTRODUODENOSCOPY (EGD) WITH PROPOFOL;  Surgeon: [name], MD;  Location: [facility] ENDOSCOPY;  Service: Gastroenterology;  Laterality: N/A;  . FOOT SURGERY Bilateral   . INCONTINENCE SURGERY  2009  . INTERSTIM IMPLANT PLACEMENT    . other     over active bladder  . OTHER SURGICAL HISTORY     bladder stimulator   . PARTIAL HYSTERECTOMY  03/1996   fibroids  . PORTA CATH INSERTION N/A 03/10/2019   Procedure: PORTA CATH INSERTION;  Surgeon: Algernon Huxley, MD;  Location: [facility];  Service: Cardiovascular;  Laterality: N/A;  . TONSILLECTOMY  2007   Family History  Problem Relation Age of Onset  . Colon cancer Father   . Renal Disease Father   . Diabetes Mellitus II Father   . Melanoma Paternal Grandmother   . Breast cancer Maternal  Aunt 45  . Anemia Mother   . Heart disease Mother   . Heart failure Mother   . Renal Disease Mother   . Congestive Heart Failure Mother   . Heart disease Maternal Uncle   . Throat cancer Maternal Uncle   . Lung cancer Maternal Uncle   . Liver disease Maternal Uncle   . Heart failure Maternal Uncle   . Hearing loss Son 17       Suicide    Social History   Socioeconomic History  . Marital status: Widowed    Spouse name: Not on file  . Number of children: Not on file  . Years of education: Not on file  . Highest education level: Not on file  Occupational History  . Occupation: Disabled  Tobacco Use  . Smoking status: Former Smoker    Packs/day: 1.00    Years: 20.00    Pack years: 20.00    Types: Cigarettes    Quit date: 07/02/1991    Years since quitting: 28.6  . Smokeless tobacco: Never Used  Vaping Use  . Vaping Use: Never used  Substance and Sexual Activity  . Alcohol use: Not Currently  . Drug use: No  . Sexual activity: Never  Other Topics Concern  . Not on file  Social History Narrative   Pt lives with her 2 sisters   Social Determinants of Health   Financial Resource Strain: Medium Risk  . Difficulty of Paying Living Expenses: Somewhat hard  Food Insecurity: No Food Insecurity  . Worried About Charity fundraiser in the Last Year: Never true  . Ran Out of Food in the Last Year: Never true  Transportation Needs: No Transportation Needs  . Lack of Transportation (Medical): No  . Lack of Transportation (Non-Medical): No  Physical Activity: Inactive  . Days of Exercise per Week: 0 days  . Minutes of Exercise per Session: 0 min  Stress: Stress Concern Present  . Feeling of Stress : Rather much  Social Connections: Unknown  . Frequency of Communication with Friends and Family: Not on file  . Frequency of Social Gatherings with Friends and Family: Not on file  . Attends Religious Services: Not on file  . Active Member of Clubs or Organizations: Not on file    . Attends Archivist Meetings: Not on file  . Marital Status: Widowed    Tobacco Counseling Counseling given: Not Answered  Clinical Intake:  Pre-visit preparation completed: Yes  Pain : 0-10 Pain Score: 5  Pain Type: Chronic pain Pain Location: Back Pain Orientation: Lower Pain Descriptors / Indicators: Discomfort Pain Onset: More than a month ago Pain Frequency: Constant     Nutritional Risks: Nausea/ vomitting/ diarrhea Diabetes: Yes CBG done?: No Did pt. bring in CBG monitor from home?: No  How often do you need to have someone help you when you read instructions, pamphlets, or other written materials from your doctor or pharmacy?: 1 - Never  Nutrition Risk Assessment:  Has the patient had any N/V/D within the last 2 months?  Yes  - related to treatments & meds Does the patient have any non-healing wounds?  No  Has the patient had any unintentional weight loss or weight gain?  No   Diabetes:  Is the patient diabetic?  Yes  If diabetic, was a CBG obtained today?  No  Did the patient bring in their glucometer from home?  No  How often do you monitor your CBG's? Several times per week  Financial Strains and Diabetes Management:  Are you having any financial strains with the device, your supplies or your medication? No .  Does the patient want to be seen by Chronic Care Management for management of their diabetes?  No  Would the patient like to be referred to a Nutritionist or for Diabetic Management?  No  - pt referred to nutritionist due to multiple myeloma treatment  Diabetic Exams:  Diabetic Eye Exam: Completed 07/27/19 negative retinopathy.   Diabetic Foot Exam: Completed 10/27/19.    Interpreter Needed?: No  Information entered by :: Clemetine Marker LPN   Activities of Daily Living In your present state of health, do you have any difficulty performing the following activities: 02/07/2020  Hearing? N  Comment declines hearing aids  Vision? N   Difficulty concentrating or making decisions? N  Walking or climbing stairs? N  Dressing or bathing? N  Doing errands, shopping? N  Preparing Food and eating ? N  Using the Toilet? N  In the past six months, have you accidently leaked urine? N  Do you have problems with loss of bowel control? N  Managing your Medications? N  Managing your Finances? N  Housekeeping or managing your Housekeeping? N  Some recent data might be hidden    Patient Care Team: Glean Hess, MD as PCP - General (Internal Medicine) Cammie Sickle, MD as Consulting Physician (Oncology) Jodell Cipro, MD as Referring Physician (Pain Medicine) Corey Skains, MD as Consulting Physician (Cardiology)  Indicate any recent Medical Services you may have received from other than [facility] providers in the past year (date may be approximate).     Assessment:   This is a routine wellness examination for [place].  Hearing/Vision screen  Hearing Screening   125Hz  250Hz  500Hz  1000Hz  2000Hz  3000Hz  4000Hz  6000Hz  8000Hz   Right ear:           Left ear:           Comments: Pt denies hearing difficulty  Vision Screening Comments: [facility] screen[facility]  Dietary issues and exercise activities discussed: Current Exercise Habits: The patient does not participate in regular exercise at present, Exercise limited by: Other - see comments (multiple myeloma)  Goals   None    Depression Screen PHQ 2/9 Scores 02/07/2020 11/30/2019 10/27/2019 01/04/2019 11/25/2018 10/28/2018 10/13/2018  PHQ - 2 Score 1 0 2 2 6 4  0  PHQ- 9 Score 8 6 5 10 18 11  -    Fall Risk Fall Risk  02/07/2020 11/30/2019 10/27/2019 11/25/2018 10/28/2018  Falls in the past year? 0 0 0 0 0  Number falls in past yr: 0 0 0 0 0  Injury with Fall? 0 0 0 0 0  Risk for fall due to : Impaired vision No Fall Risks No Fall Risks - -  Follow up Falls prevention discussed Falls evaluation completed Falls evaluation completed Falls  evaluation completed Falls evaluation completed    Any stairs in or around the home? Yes  If so, are there any without handrails? Yes  - 2 steps outside Home free of loose throw rugs in walkways, pet beds, electrical cords, etc? Yes  Adequate lighting in your home to reduce risk of falls? Yes   ASSISTIVE DEVICES UTILIZED TO PREVENT FALLS:  Life alert? No  Use of a cane, walker or w/c? No  Grab bars in the bathroom? No  Shower chair or bench in shower? No  Elevated toilet seat or a handicapped toilet? No   TIMED UP AND GO:  Was the test performed? No . Telephonic visit.   Cognitive Function: Pt declined 6CIT for 2021 AWV; pt states no memory issues.         Immunizations Immunization History  Administered Date(s) Administered  . Hepatitis B, adult 07/09/2016, 09/10/2016, 07/08/2017  . HiB (PRP-T) 07/09/2016, 09/10/2016, 07/08/2017  . IPV 07/09/2016, 09/10/2016, 07/08/2017  . Influenza,inj,Quad PF,6+ Mos 03/23/2015, 06/18/2016, 04/08/2017, 06/30/2017, 03/31/2018, 04/12/2019  . MMR 07/08/2017  . PFIZER SARS-COV-2 Vaccination 09/20/2019, 10/09/2019  . Pneumococcal Conjugate-13 03/23/2015, 07/09/2016, 01/07/2017, 03/11/2017  . Pneumococcal Polysaccharide-23 07/08/2017  . Tdap 07/09/2016, 09/10/2016, 02/12/2018  . Zoster Recombinat (Shingrix) 11/19/2017, 02/12/2018    TDAP status: Up to date   Flu Vaccine status: Up to date   Pneumococcal vaccine status: Up to date   Covid-19 vaccine status: Completed vaccines  Qualifies for Shingles Vaccine? Yes   Zostavax completed Yes   Shingrix Completed?: Yes  Screening Tests Health Maintenance  Topic Date Due  . INFLUENZA VACCINE  01/30/2020  . HEMOGLOBIN A1C  04/16/2020  . OPHTHALMOLOGY EXAM  07/26/2020  . FOOT EXAM  10/26/2020  . MAMMOGRAM  12/06/2021  . COLONOSCOPY  05/08/2027  . TETANUS/TDAP  02/13/2028  . PNEUMOCOCCAL POLYSACCHARIDE VACCINE AGE 55-64 HIGH RISK  Completed  . COVID-19 Vaccine  Completed  . Hepatitis C  Screening  Completed  . HIV Screening  Completed    Health Maintenance  Health Maintenance Due  Topic Date Due  . INFLUENZA VACCINE  01/30/2020    Colorectal cancer screening: Completed 05/07/17. Repeat every 10 years   Mammogram status: Completed 12/07/19. Repeat every year   Bone density screening status: due at age 60  Lung Cancer Screening: (Low Dose CT Chest recommended if Age 47-80 years, 30 pack-year currently smoking OR have quit w/in 15years.) does not qualify.   Additional Screening:  Hepatitis C Screening: does qualify; Completed 06/11/18  Vision Screening: Recommended annual ophthalmology exams for early detection of glaucoma and other disorders of the eye. Is the patient up to date with their annual eye exam?  Yes  Who is the provider or what is the name of the office in which the patient attends annual eye exams? [place] Screening: Recommended annual dental exams for proper oral hygiene  Community Resource Referral / Chronic Care Management: CRR required this visit?  Yes   CCM required this visit?  No      Plan:     I have personally reviewed and noted the following in the patient's chart:   . Medical and social history . Use of alcohol, tobacco or illicit drugs  . Current medications and supplements . Functional ability and status . Nutritional status . Physical activity . Advanced directives . List of other physicians . Hospitalizations, surgeries, and ER visits in previous 12 months . Vitals . Screenings to include cognitive, depression, and falls . Referrals and appointments  In addition, I have reviewed and discussed with patient certain preventive protocols, quality metrics, and best practice recommendations. A written personalized care plan for preventive services as well as general preventive health recommendations were provided to patient.     Clemetine Marker, LPN   12/30/8207   Nurse Notes: pt c/o difficulty falling asleep  and staying asleep at time. Pt has taken 2 trazodone 100 mg occasionally to help with sleep and wants to know if her dosage can be increased. Pt advised to discuss with Dr. Army Melia.

## 2020-02-08 ENCOUNTER — Encounter: Payer: Self-pay | Admitting: Hematology and Oncology

## 2020-02-08 ENCOUNTER — Other Ambulatory Visit: Payer: Self-pay

## 2020-02-08 ENCOUNTER — Other Ambulatory Visit: Payer: Self-pay | Admitting: Hematology and Oncology

## 2020-02-08 ENCOUNTER — Ambulatory Visit: Payer: Medicare PPO | Attending: Hematology and Oncology | Admitting: Physical Therapy

## 2020-02-08 DIAGNOSIS — G894 Chronic pain syndrome: Secondary | ICD-10-CM | POA: Diagnosis not present

## 2020-02-08 DIAGNOSIS — R202 Paresthesia of skin: Secondary | ICD-10-CM | POA: Diagnosis not present

## 2020-02-08 DIAGNOSIS — M6281 Muscle weakness (generalized): Secondary | ICD-10-CM | POA: Diagnosis not present

## 2020-02-08 DIAGNOSIS — R200 Anesthesia of skin: Secondary | ICD-10-CM | POA: Diagnosis not present

## 2020-02-08 DIAGNOSIS — R110 Nausea: Secondary | ICD-10-CM

## 2020-02-08 LAB — PROTEIN ELECTROPHORESIS, SERUM
A/G Ratio: 1.4 (ref 0.7–1.7)
Albumin ELP: 3.8 g/dL (ref 2.9–4.4)
Alpha-1-Globulin: 0.3 g/dL (ref 0.0–0.4)
Alpha-2-Globulin: 1 g/dL (ref 0.4–1.0)
Beta Globulin: 1 g/dL (ref 0.7–1.3)
Gamma Globulin: 0.5 g/dL (ref 0.4–1.8)
Globulin, Total: 2.7 g/dL (ref 2.2–3.9)
M-Spike, %: 0.2 g/dL — ABNORMAL HIGH
Total Protein ELP: 6.5 g/dL (ref 6.0–8.5)

## 2020-02-08 LAB — KAPPA/LAMBDA LIGHT CHAINS
Kappa free light chain: 4.9 mg/L (ref 3.3–19.4)
Kappa, lambda light chain ratio: 0.34 (ref 0.26–1.65)
Lambda free light chains: 14.6 mg/L (ref 5.7–26.3)

## 2020-02-08 NOTE — Therapy (Signed)
[place] [facility] [address] [place], [place], [address] Phone: [phone]   Fax:  [phone]  Physical Therapy Evaluation  Patient Details  Name: Sarah [name] MRN: [id number] Date of Birth: 1956/04/04 Referring Provider (PT): Nolon Stalls, MD   Encounter Date: 02/08/2020   PT End of Session - 02/08/20 1213    Visit Number 1    Number of Visits 9    Date for PT Re-Evaluation 04/04/20    Authorization - Visit Number 1    Authorization - Number of Visits 10    PT Start Time 0949    PT Stop Time 1042    PT Time Calculation (min) 53 min    Activity Tolerance Patient tolerated treatment well    Behavior During Therapy [facility] for tasks assessed/performed           Past Medical History:  Diagnosis Date  . Abnormal stress test 02/14/2016   Overview:  Added automatically from request for surgery [id number]  . Anemia   . Anxiety   . Arthritis   . Bicuspid aortic valve   . CHF (congestive heart failure) ([place])   . CKD (chronic kidney disease) stage 3, GFR 30-59 ml/min   . Depression   . Diabetes mellitus ([place])   . Dizziness   . Fatty liver   . Frequent falls   . GERD (gastroesophageal reflux disease)   . Gout   . Heart murmur   . History of blood transfusion   . History of bone marrow transplant ([place])   . History of uterine fibroid   . Hx of cardiac catheterization 06/05/2016   Overview:  Normal coronaries 2017  . Hypertension   . Hypomagnesemia   . Multiple myeloma ([place])   . Personal history of chemotherapy   . Renal cyst     Past Surgical History:  Procedure Laterality Date  . ABDOMINAL HYSTERECTOMY    . 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 CARDIAC ELECTROPHYSIOLOGY MAPPING AND ABLATION    . CARPAL TUNNEL RELEASE Bilateral   . CHOLECYSTECTOMY  2008  . COLONOSCOPY WITH PROPOFOL N/A 05/07/2017   Procedure: COLONOSCOPY WITH PROPOFOL;  Surgeon: [name], MD;  Location: [facility] ENDOSCOPY;  Service: Gastroenterology;   Laterality: N/A;  . ESOPHAGOGASTRODUODENOSCOPY (EGD) WITH PROPOFOL N/A 05/07/2017   Procedure: ESOPHAGOGASTRODUODENOSCOPY (EGD) WITH PROPOFOL;  Surgeon: [name], MD;  Location: [facility] ENDOSCOPY;  Service: Gastroenterology;  Laterality: N/A;  . FOOT SURGERY Bilateral   . INCONTINENCE SURGERY  2009  . INTERSTIM IMPLANT PLACEMENT    . other     over active bladder  . OTHER SURGICAL HISTORY     bladder stimulator   . PARTIAL HYSTERECTOMY  03/1996   fibroids  . PORTA CATH INSERTION N/A 03/10/2019   Procedure: PORTA CATH INSERTION;  Surgeon: Algernon Huxley, MD;  Location: [place] CV LAB;  Service: Cardiovascular;  Laterality: N/A;  . TONSILLECTOMY  2007    There were no vitals filed for this visit.    Subjective Assessment - 02/08/20 0950    Subjective Pt. had a bone marrow transplant in 2016, pt felt she has "never recovered." Pt states that her back is her biggest problem, she had received ablations at L3,4,5. Pt. states back pain started around 2005/2006, and in the last 5 years pt started having N/T in both legs, not simulataneously. The N/T goes down into the feet. She feels this about 3-5 mins into walking her dogs. Pt is receiving chemo on pills  and shots, and she is not currently having any side effects. Pt. states that pain in back is about 5/10, and when it gets up to 12/05/08 is when N/T goes down either leg.Pain gets worse in standing and walking after about 3-5 mins. Nothing makes the pain better. Pt. reports no pain in the legs, only N/T. Pt. has a bladder stimulator, was placed in 2019. Pt. is here on referral from hematologist to increase strength before next bone marrow transplant. Will be re-evaluated in the 2021.    Pertinent History pt has multiple myeloma, diagnosed in 2016. has bladder stimulator.    Patient Stated Goals Increase strength    Currently in Pain? Yes    Pain Score 5     Pain Location Back    Pain Orientation Lower    Pain Descriptors / Indicators Aching;Dull     Pain Type Chronic pain             [facility] PT Assessment - 02/09/20 0001      Assessment   Medical Diagnosis Chronic pain syndrome    Referring Provider (PT) Nolon Stalls, MD    Onset Date/Surgical Date 07/02/19      Prior Function   Level of Independence Independent           OBJECTIVE   Mental Status Patient is oriented to person, place and time.  Recent memory is intact.  Remote memory is intact.  Attention span and concentration are intact.  Expressive speech is intact.  Patient's fund of knowledge is within normal limits for educational level.    Strength (out of 5) R/L 4/4 Traps 4-/4- Shoulder abduction 3/4 Shoulder ER 3/4 Shoulder IR 4/4+ Biceps 3+/4 Triceps 3+*/3+* Hip flexion: pain in low back, worse on R>L 4-/4-* Hip abduction (seated): pain in low back 5/5 Hip adduction (seated) 5/5 Knee extension 4-/4- Knee flexion 4/4 Ankle dorsiflexion *Indicates pain  Special Tests Slump Test: +R/-L 5xSTS: 25.57s. Pt feels pain in back SLR: +R/ -L Hamstring length: WFL  Palpation Pt is very tight in bilat lumbar paraspinals    There Ex: Supine TrA activation: 10x Supine TrA activation with post pelvic tilt: 10x Supine TrA activation heel slides: 10x     Objective measurements completed on examination: See above findings.    See HEP      Plan - 02/08/20 1214    Clinical Impression Statement Pt. is a 64 y.o. woman with multiple myeloma. Pt. states she was referred by her hematologist to improve overall strength before getting a second bone marrow transplant this fall. Pt. demonstrates following strength R/L 4/4 Traps, 4-/4- Shoulder abduction, 3/4 Shoulder ER, 3/4 Shoulder IR, 4/4+ Biceps, 3+/4 Triceps, 3+/3+ Hip flexion, 4-/4- Hip abduction, 5/5 Hip adduction (seated), 5/5 Knee extension, 4-/4- Knee flexion, 4/4 Ankle dorsiflexion. Pt. demonstrated positive SLR and slump test on R. Pt. completed 5XSTS in 25.57s. Pt. states she is in  constant 5/10 back pain, and when it reaches 6-7/10 N/T starts down either leg. Pt. states that walking or standing for 3-5 minutes brings on pain in back. Pt. completed supine TrA activaiton, TrA activation with posterior pelvic tilts, and TrA heel slides. Pt. will benefit from skilled PT to decrease back pain and referred symptoms and improve strength to improve ability to complete ADLs and improve pain free functional mobility.    Personal Factors and Comorbidities Comorbidity 2;Comorbidity 1    Comorbidities cancer    Stability/Clinical Decision Making Evolving/Moderate complexity    Clinical Decision Making Moderate  Rehab Potential Good    PT Frequency 1x / week    PT Duration 8 weeks    PT Treatment/Interventions ADLs/Self Care Home Management;Cryotherapy;Neuromuscular re-education;Balance training;Therapeutic exercise;Therapeutic activities;Functional mobility training;Gait training;Patient/family education;Manual techniques    PT Next Visit Plan core progression, LE nerve glides    PT Home Exercise Plan core progression handout page 1    Consulted and Agree with Plan of Care Patient           Patient will benefit from skilled therapeutic intervention in order to improve the following deficits and impairments:  Abnormal gait, Decreased activity tolerance, Decreased balance, Decreased mobility, Decreased strength, Impaired sensation, Postural dysfunction, Difficulty walking  Visit Diagnosis: Muscle weakness (generalized)  Chronic pain syndrome  Numbness and tingling     Problem List Patient Active Problem List   Diagnosis Date Noted  . Physical deconditioning 02/07/2020  . Impaired nutrition 02/07/2020  . 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 Acute renal insufficiency 01/24/2020  . Sepsis ([place]) 11/13/2019  . Pneumonia of right lung due to infectious organism 11/13/2019  . Constipation 10/24/2019  . AKI (acute kidney injury) ([place]) 10/15/2019  . Neutropenic fever  ([place]) 10/15/2019  . Febrile neutropenia ([place]) 10/15/2019  . Encounter for antineoplastic immunotherapy 10/03/2019  . Hypocalcemia 08/28/2019  . Stage 3 chronic kidney disease 05/12/2019  . Chronic nausea 12/17/2018  . Chemotherapy-induced neuropathy ([place]) 12/17/2018  . Hyperlipidemia associated with type 2 diabetes mellitus ([place]) 11/25/2018  . Normocytic anemia 10/21/2018  . Hypomagnesemia 08/20/2018  . Goals of care, counseling/discussion 08/20/2018  . B12 deficiency 08/20/2018  . Thrombocytopenia ([place]) 02/09/2018  . Benign essential HTN 08/21/2017  . Cardiomyopathy, idiopathic ([place]) 08/21/2017  . Carotid artery stenosis, asymptomatic, bilateral 08/06/2017  . Thyroid nodule 08/06/2017  . Cardiac syncope 07/25/2017  . Iron deficiency anemia due to chronic blood loss 05/13/2017  . Spinal stenosis of lumbar region with neurogenic claudication 04/09/2017  . 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 LBBB (left bundle branch block) 10/10/2016  . Long term prescription opiate use 09/07/2016  . Chronic pain syndrome 07/08/2016  . Pain medication agreement signed 07/08/2016  . Spondylosis of lumbar region without myelopathy or radiculopathy 07/08/2016  . Chronic diastolic CHF (congestive heart failure), NYHA class 2 ([place]) 06/05/2016  . LVH (left ventricular hypertrophy) due to hypertensive disease, with heart failure ([place]) 06/05/2016  . Mild aortic valve stenosis 06/05/2016  . 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[place]) 06/05/2016  . Environmental and seasonal allergies 04/19/2016  . Insomnia 04/19/2016  . Bicuspid aortic valve 04/19/2016  . Asthma 04/19/2016  . Aortic regurgitation 04/19/2016  . Type II diabetes mellitus with complication ([place]) 78/24/2353  . Depression, major, single episode, in partial remission ([place]) 04/12/2016  . Irritable bowel syndrome (IBS) 04/12/2016  . Hepatic steatosis 04/12/2016  . Multiple myeloma not having achieved remission ([place]) 04/02/2016   . 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[place]) 07/06/2015  . Stem cell transplant candidate 05/16/2015  . Disequilibrium 01/14/2013   Pura Spice, PT, DPT # [id number], SPT 02/09/2020, 3:08 PM  [place] [facility] [address] [place], [place], [address] Phone: [phone]   Fax:  [phone]  Name: Sarah [name] MRN: [id number] Date of Birth: 12/03/1955

## 2020-02-09 ENCOUNTER — Encounter: Payer: Self-pay | Admitting: Physical Therapy

## 2020-02-09 MED ORDER — PROMETHAZINE HCL 25 MG PO TABS: 25.0000 mg | ORAL_TABLET | Freq: Four times a day (QID) | ORAL | 0 refills | Status: DC | PRN

## 2020-02-10 ENCOUNTER — Inpatient Hospital Stay: Payer: Medicare PPO

## 2020-02-10 ENCOUNTER — Other Ambulatory Visit: Payer: Self-pay

## 2020-02-10 VITALS — BP 100/59 | HR 78 | Resp 16

## 2020-02-10 VITALS — BP 91/52 | HR 85 | Temp 96.7°F | Resp 16

## 2020-02-10 DIAGNOSIS — R61 Generalized hyperhidrosis: Secondary | ICD-10-CM | POA: Diagnosis not present

## 2020-02-10 DIAGNOSIS — C9000 Multiple myeloma not having achieved remission: Secondary | ICD-10-CM

## 2020-02-10 DIAGNOSIS — G629 Polyneuropathy, unspecified: Secondary | ICD-10-CM | POA: Diagnosis not present

## 2020-02-10 DIAGNOSIS — E538 Deficiency of other specified B group vitamins: Secondary | ICD-10-CM | POA: Diagnosis not present

## 2020-02-10 DIAGNOSIS — D801 Nonfamilial hypogammaglobulinemia: Secondary | ICD-10-CM | POA: Diagnosis not present

## 2020-02-10 DIAGNOSIS — R110 Nausea: Secondary | ICD-10-CM | POA: Diagnosis not present

## 2020-02-10 DIAGNOSIS — Z9484 Stem cells transplant status: Secondary | ICD-10-CM

## 2020-02-10 DIAGNOSIS — R531 Weakness: Secondary | ICD-10-CM | POA: Diagnosis not present

## 2020-02-10 DIAGNOSIS — Z5112 Encounter for antineoplastic immunotherapy: Secondary | ICD-10-CM | POA: Diagnosis not present

## 2020-02-10 DIAGNOSIS — D649 Anemia, unspecified: Secondary | ICD-10-CM | POA: Diagnosis not present

## 2020-02-10 LAB — MAGNESIUM: Magnesium: 1.2 mg/dL — ABNORMAL LOW (ref 1.7–2.4)

## 2020-02-10 MED ORDER — HEPARIN SOD (PORK) LOCK FLUSH 100 UNIT/ML IV SOLN
500.0000 [IU] | Freq: Once | INTRAVENOUS | Status: AC | PRN
  Administered 2020-02-10: 500 [IU]
  Filled 2020-02-10: qty 5

## 2020-02-10 MED ORDER — SODIUM CHLORIDE 0.9 % IV SOLN
6.0000 g | Freq: Once | INTRAVENOUS | Status: AC
  Administered 2020-02-10: 6 g via INTRAVENOUS
  Filled 2020-02-10: qty 12

## 2020-02-10 MED ORDER — CYANOCOBALAMIN 1000 MCG/ML IJ SOLN
1000.0000 ug | Freq: Once | INTRAMUSCULAR | Status: AC
  Administered 2020-02-10: 1000 ug via INTRAMUSCULAR
  Filled 2020-02-10: qty 1

## 2020-02-10 MED ORDER — SODIUM CHLORIDE 0.9% FLUSH
10.0000 mL | INTRAVENOUS | Status: DC | PRN
  Administered 2020-02-10: 10 mL
  Filled 2020-02-10: qty 10

## 2020-02-10 MED ORDER — SODIUM CHLORIDE 0.9 % IV SOLN
Freq: Once | INTRAVENOUS | Status: AC
  Filled 2020-02-10: qty 250

## 2020-02-10 MED ORDER — PROMETHAZINE HCL 25 MG/ML IJ SOLN
25.0000 mg | Freq: Once | INTRAMUSCULAR | Status: AC
  Administered 2020-02-10: 25 mg via INTRAVENOUS
  Filled 2020-02-10: qty 1

## 2020-02-10 NOTE — Progress Notes (Signed)
Nutrition Assessment   Reason for Assessment:  Referral from Dr. Mike Gip   ASSESSMENT:  64 year old female with multiple myeloma currently on treatment.  Noted planning second stem cell transplant.    Spoke with patient via phone.  Patient reports that chemo effects intake, taste alterations.  Also has struggled with nausea for 10 years.  Requesting information on a heart healthy, carb controlled diet.   Medications:  reviewed   Labs: reviewed   Anthropometrics:   Height: 62 inches Weight: 141 lb on 8/9 144 lb (7/28)  Patient states she wants to stay around this weight.  Has lost around 16 lb since March 2021 BMI: 25  10% weight loss in the last 4 months   Estimated Energy Needs  Kcals: 1900-2200 Protein: 95-110 g Fluid: > 1.9 L   NUTRITION DIAGNOSIS: Inadequate oral intake related to cancer related treatment side effects as evidenced by 10% weight loss in the last 4 months   INTERVENTION:  Encouraged patient to continue nausea medications to allow her to eat. Discussed Heart Healthy Consistent Carbohydrate Nutrition Therapy.  Will mail handout from [facility].   Encouraged patient to keep food diary and record calories in compared to goal. Contact number provided to patient    MONITORING, EVALUATION, GOAL: weight trends, intake   Next Visit: Sept 9th phone f/u  Ronnae Kaser B. Zenia Resides, [place], [place] Registered Dietitian [phone] (mobile)

## 2020-02-14 ENCOUNTER — Inpatient Hospital Stay: Payer: Medicare PPO

## 2020-02-14 ENCOUNTER — Other Ambulatory Visit: Payer: Self-pay

## 2020-02-14 VITALS — BP 118/74 | HR 74 | Temp 98.2°F | Resp 16

## 2020-02-14 DIAGNOSIS — C9000 Multiple myeloma not having achieved remission: Secondary | ICD-10-CM

## 2020-02-14 DIAGNOSIS — Z5112 Encounter for antineoplastic immunotherapy: Secondary | ICD-10-CM | POA: Diagnosis not present

## 2020-02-14 DIAGNOSIS — Z9484 Stem cells transplant status: Secondary | ICD-10-CM

## 2020-02-14 DIAGNOSIS — D649 Anemia, unspecified: Secondary | ICD-10-CM | POA: Diagnosis not present

## 2020-02-14 DIAGNOSIS — G629 Polyneuropathy, unspecified: Secondary | ICD-10-CM | POA: Diagnosis not present

## 2020-02-14 DIAGNOSIS — R110 Nausea: Secondary | ICD-10-CM | POA: Diagnosis not present

## 2020-02-14 DIAGNOSIS — Z95828 Presence of other vascular implants and grafts: Secondary | ICD-10-CM

## 2020-02-14 DIAGNOSIS — R531 Weakness: Secondary | ICD-10-CM | POA: Diagnosis not present

## 2020-02-14 DIAGNOSIS — D801 Nonfamilial hypogammaglobulinemia: Secondary | ICD-10-CM | POA: Diagnosis not present

## 2020-02-14 DIAGNOSIS — R61 Generalized hyperhidrosis: Secondary | ICD-10-CM | POA: Diagnosis not present

## 2020-02-14 DIAGNOSIS — E538 Deficiency of other specified B group vitamins: Secondary | ICD-10-CM | POA: Diagnosis not present

## 2020-02-14 LAB — CBC WITH DIFFERENTIAL/PLATELET
Abs Immature Granulocytes: 0.02 10*3/uL (ref 0.00–0.07)
Basophils Absolute: 0 10*3/uL (ref 0.0–0.1)
Basophils Relative: 1 %
Eosinophils Absolute: 0.2 10*3/uL (ref 0.0–0.5)
Eosinophils Relative: 5 %
HCT: 33.3 % — ABNORMAL LOW (ref 36.0–46.0)
Hemoglobin: 11.4 g/dL — ABNORMAL LOW (ref 12.0–15.0)
Immature Granulocytes: 0 %
Lymphocytes Relative: 15 %
Lymphs Abs: 0.8 10*3/uL (ref 0.7–4.0)
MCH: 30.5 pg (ref 26.0–34.0)
MCHC: 34.2 g/dL (ref 30.0–36.0)
MCV: 89 fL (ref 80.0–100.0)
Monocytes Absolute: 0.3 10*3/uL (ref 0.1–1.0)
Monocytes Relative: 5 %
Neutro Abs: 3.6 10*3/uL (ref 1.7–7.7)
Neutrophils Relative %: 74 %
Platelets: 161 10*3/uL (ref 150–400)
RBC: 3.74 MIL/uL — ABNORMAL LOW (ref 3.87–5.11)
RDW: 12.7 % (ref 11.5–15.5)
WBC: 4.9 10*3/uL (ref 4.0–10.5)
nRBC: 0 % (ref 0.0–0.2)

## 2020-02-14 LAB — MAGNESIUM: Magnesium: 1.2 mg/dL — ABNORMAL LOW (ref 1.7–2.4)

## 2020-02-14 MED ORDER — SODIUM CHLORIDE 0.9% FLUSH
10.0000 mL | INTRAVENOUS | Status: DC | PRN
  Administered 2020-02-14: 10 mL via INTRAVENOUS
  Filled 2020-02-14: qty 10

## 2020-02-14 MED ORDER — SODIUM CHLORIDE 0.9 % IV SOLN
Freq: Once | INTRAVENOUS | Status: AC
  Filled 2020-02-14: qty 250

## 2020-02-14 MED ORDER — SODIUM CHLORIDE 0.9 % IV SOLN
6.0000 g | Freq: Once | INTRAVENOUS | Status: AC
  Administered 2020-02-14: 6 g via INTRAVENOUS
  Filled 2020-02-14: qty 12

## 2020-02-14 MED ORDER — HEPARIN SOD (PORK) LOCK FLUSH 100 UNIT/ML IV SOLN
500.0000 [IU] | Freq: Once | INTRAVENOUS | Status: AC
  Administered 2020-02-14: 500 [IU] via INTRAVENOUS
  Filled 2020-02-14: qty 5

## 2020-02-16 ENCOUNTER — Other Ambulatory Visit: Payer: Self-pay

## 2020-02-16 ENCOUNTER — Ambulatory Visit: Payer: Medicare PPO | Admitting: Physical Therapy

## 2020-02-16 ENCOUNTER — Encounter: Payer: Self-pay | Admitting: Physical Therapy

## 2020-02-16 DIAGNOSIS — G894 Chronic pain syndrome: Secondary | ICD-10-CM | POA: Diagnosis not present

## 2020-02-16 DIAGNOSIS — M6281 Muscle weakness (generalized): Secondary | ICD-10-CM | POA: Diagnosis not present

## 2020-02-16 DIAGNOSIS — R200 Anesthesia of skin: Secondary | ICD-10-CM

## 2020-02-16 DIAGNOSIS — R202 Paresthesia of skin: Secondary | ICD-10-CM | POA: Diagnosis not present

## 2020-02-16 NOTE — Therapy (Signed)
[place] [facility] [address] [place], [place], [address] Phone: [phone]   Fax:  [phone]  Physical Therapy Treatment  Patient Details  Name: Sarah [name] MRN: [id number] Date of Birth: 1955/11/04 Referring Provider (PT): Nolon Stalls, MD   Encounter Date: 02/16/2020   PT End of Session - 02/16/20 0740    Visit Number 2    Number of Visits 9    Date for PT Re-Evaluation 04/04/20    Authorization - Visit Number 2    Authorization - Number of Visits 10    PT Start Time 0732    PT Stop Time 0823    PT Time Calculation (min) 51 min    Activity Tolerance Patient tolerated treatment well    Behavior During Therapy [facility] for tasks assessed/performed           Past Medical History:  Diagnosis Date  . Abnormal stress test 02/14/2016   Overview:  Added automatically from request for surgery [id number]  . Anemia   . Anxiety   . Arthritis   . Bicuspid aortic valve   . CHF (congestive heart failure) ([place])   . CKD (chronic kidney disease) stage 3, GFR 30-59 ml/min   . Depression   . Diabetes mellitus ([place])   . Dizziness   . Fatty liver   . Frequent falls   . GERD (gastroesophageal reflux disease)   . Gout   . Heart murmur   . History of blood transfusion   . History of bone marrow transplant ([place])   . History of uterine fibroid   . Hx of cardiac catheterization 06/05/2016   Overview:  Normal coronaries 2017  . Hypertension   . Hypomagnesemia   . Multiple myeloma ([place])   . Personal history of chemotherapy   . Renal cyst     Past Surgical History:  Procedure Laterality Date  . ABDOMINAL HYSTERECTOMY    . 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 CARDIAC ELECTROPHYSIOLOGY MAPPING AND ABLATION    . CARPAL TUNNEL RELEASE Bilateral   . CHOLECYSTECTOMY  2008  . COLONOSCOPY WITH PROPOFOL N/A 05/07/2017   Procedure: COLONOSCOPY WITH PROPOFOL;  Surgeon: [name], MD;  Location: [facility] ENDOSCOPY;  Service: Gastroenterology;   Laterality: N/A;  . ESOPHAGOGASTRODUODENOSCOPY (EGD) WITH PROPOFOL N/A 05/07/2017   Procedure: ESOPHAGOGASTRODUODENOSCOPY (EGD) WITH PROPOFOL;  Surgeon: [name], MD;  [facility] ENDOSCOPY;  Service: Gastroenterology;  Laterality: N/A;  . FOOT SURGERY Bilateral   . INCONTINENCE SURGERY  2009  . INTERSTIM IMPLANT PLACEMENT    . other     over active bladder  . OTHER SURGICAL HISTORY     bladder stimulator   . PARTIAL HYSTERECTOMY  03/1996   fibroids  . PORTA CATH INSERTION N/A 03/10/2019   Procedure: PORTA CATH INSERTION;  Surgeon: Algernon Huxley, MD;  Location: [place] CV LAB;  Service: Cardiovascular;  Laterality: N/A;  . TONSILLECTOMY  2007    There were no vitals filed for this visit.   Subjective Assessment - 02/16/20 0737    Subjective Pt. states that she feels like she has more energy since starting taking new vitamins. She states her R hip/low back has been sore, as well as feeling a knot in her low back.    Pertinent History pt has multiple myeloma, diagnosed in 2016. has bladder stimulator.    Patient Stated Goals Increase strength    Currently in Pain? Yes    Pain Score 7     Pain  Location Hip    Pain Orientation Right    Pain Onset More than a month ago            There Ex:  Nustep L3 10 mins   TrA activation: rechecking HEP. Pt required new cueing to get same TrA activation. Pt was able to complete 10x5 sec TrA holds  TrA heel slides: 10x each  Supine R piriformis stretch  Manual:   STM to low back and R hips: PT felt knot near R sacrum near posterior iliac crest. Pt reported feeling a bit better after STM, but still feels knot. Pt reported that she will talk to her MD about it to check.       PT Long Term Goals - 02/08/20 1239      PT LONG TERM GOAL #1   Title Pt. will be able to walk for 10 minutes without pain increasing above 3/10 in low back to improve functional mobility.    Baseline initial: pt can only walk 3-5 mins before onset of  pain 6-7/10.    Time 8    Period Weeks    Status New    Target Date 04/04/20      PT LONG TERM GOAL #2   Title Pt will improve 5XSTS to 15 seconds or less to improve overall strength and functional mobility.    Baseline initial: 25.57s    Time 8    Period Weeks    Status New    Target Date 04/04/20      PT LONG TERM GOAL #3   Title Pt will improve global strength to at least 4/5 to improve functional mobility.    Baseline R/L 4/4 Traps, 4-/4- Shoulder abduction, 3/4 Shoulder ER, 3/4 Shoulder IR, 4/4+ Biceps, 3+/4 Triceps, 3+/3+ Hip flexion, 4-/4- Hip abduction, 5/5 Hip adduction (seated), 5/5 Knee extension, 4-/4- Knee flexion, 4/4 Ankle dorsiflexion    Time 8    Period Weeks    Status New    Target Date 04/04/20               Plan - 02/16/20 0917    Clinical Impression Statement Pt. reports new R hip pain she believes from exercises. After reviewing HEP, pt feels better with hip pain and back pain. Pt. reported new onset muscle knot in low back on R side, pt will consult MD about it next time.  Pt. reported having received manual therapy/bodywork in the past and it has been cleared by MD. PT completed STM to low back and knot and pt reported feeling slightly better. Pt. will continue to benefit from skilled PT to improve ability to complete ADLs and improve pain free functional mobility.    Personal Factors and Comorbidities Comorbidity 2;Comorbidity 1    Comorbidities cancer    Stability/Clinical Decision Making Evolving/Moderate complexity    Clinical Decision Making Moderate    Rehab Potential Good    PT Frequency 1x / week    PT Duration 8 weeks    PT Treatment/Interventions ADLs/Self Care Home Management;Cryotherapy;Neuromuscular re-education;Balance training;Therapeutic exercise;Therapeutic activities;Functional mobility training;Gait training;Patient/family education;Manual techniques    PT Next Visit Plan core progression, LE nerve glides    PT Home Exercise Plan core  progression handout page 1    Consulted and Agree with Plan of Care Patient           Patient will benefit from skilled therapeutic intervention in order to improve the following deficits and impairments:  Abnormal gait, Decreased activity tolerance, Decreased balance,  Decreased mobility, Decreased strength, Impaired sensation, Postural dysfunction, Difficulty walking  Visit Diagnosis: Muscle weakness (generalized)  Chronic pain syndrome  Numbness and tingling     Problem List Patient Active Problem List   Diagnosis Date Noted  . Physical deconditioning 02/07/2020  . Impaired nutrition 02/07/2020  . 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 Acute renal insufficiency 01/24/2020  . Sepsis ([place]) 11/13/2019  . Pneumonia of right lung due to infectious organism 11/13/2019  . Constipation 10/24/2019  . AKI (acute kidney injury) ([place]) 10/15/2019  . Neutropenic fever ([place]) 10/15/2019  . Febrile neutropenia ([place]) 10/15/2019  . Encounter for antineoplastic immunotherapy 10/03/2019  . Hypocalcemia 08/28/2019  . Stage 3 chronic kidney disease 05/12/2019  . Chronic nausea 12/17/2018  . Chemotherapy-induced neuropathy ([place]) 12/17/2018  . Hyperlipidemia associated with type 2 diabetes mellitus ([place]) 11/25/2018  . Normocytic anemia 10/21/2018  . Hypomagnesemia 08/20/2018  . Goals of care, counseling/discussion 08/20/2018  . B12 deficiency 08/20/2018  . Thrombocytopenia ([place]) 02/09/2018  . Benign essential HTN 08/21/2017  . Cardiomyopathy, idiopathic ([place]) 08/21/2017  . Carotid artery stenosis, asymptomatic, bilateral 08/06/2017  . Thyroid nodule 08/06/2017  . Cardiac syncope 07/25/2017  . Iron deficiency anemia due to chronic blood loss 05/13/2017  . Spinal stenosis of lumbar region with neurogenic claudication 04/09/2017  . 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 LBBB (left bundle branch block) 10/10/2016  . Long term prescription opiate use  09/07/2016  . Chronic pain syndrome 07/08/2016  . Pain medication agreement signed 07/08/2016  . Spondylosis of lumbar region without myelopathy or radiculopathy 07/08/2016  . Chronic diastolic CHF (congestive heart failure), NYHA class 2 ([place]) 06/05/2016  . LVH (left ventricular hypertrophy) due to hypertensive disease, with heart failure ([place]) 06/05/2016  . Mild aortic valve stenosis 06/05/2016  . 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[place]) 06/05/2016  . Environmental and seasonal allergies 04/19/2016  . Insomnia 04/19/2016  . Bicuspid aortic valve 04/19/2016  . Asthma 04/19/2016  . Aortic regurgitation 04/19/2016  . Type II diabetes mellitus with complication ([place]) 09/73/5329  . Depression, major, single episode, in partial remission ([place]) 04/12/2016  . Irritable bowel syndrome (IBS) 04/12/2016  . Hepatic steatosis 04/12/2016  . Multiple myeloma not having achieved remission ([place]) 04/02/2016  . 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[place]) 07/06/2015  . Stem cell transplant candidate 05/16/2015  . Disequilibrium 01/14/2013   Pura Spice, PT, DPT # [id number], SPT 02/16/2020, 12:36 PM  [place] [facility] [address] [place], [place], [address] Phone: [phone]   Fax:  [phone]  Name: Sarah [name] MRN: [id number] Date of Birth: 09/26/1955

## 2020-02-17 ENCOUNTER — Inpatient Hospital Stay: Payer: Medicare PPO

## 2020-02-17 ENCOUNTER — Other Ambulatory Visit: Payer: Self-pay | Admitting: Hematology and Oncology

## 2020-02-17 VITALS — BP 124/76 | HR 82 | Temp 98.2°F | Resp 16

## 2020-02-17 DIAGNOSIS — R110 Nausea: Secondary | ICD-10-CM | POA: Diagnosis not present

## 2020-02-17 DIAGNOSIS — Z95828 Presence of other vascular implants and grafts: Secondary | ICD-10-CM

## 2020-02-17 DIAGNOSIS — G629 Polyneuropathy, unspecified: Secondary | ICD-10-CM | POA: Diagnosis not present

## 2020-02-17 DIAGNOSIS — Z9484 Stem cells transplant status: Secondary | ICD-10-CM

## 2020-02-17 DIAGNOSIS — C9000 Multiple myeloma not having achieved remission: Secondary | ICD-10-CM | POA: Diagnosis not present

## 2020-02-17 DIAGNOSIS — Z5112 Encounter for antineoplastic immunotherapy: Secondary | ICD-10-CM | POA: Diagnosis not present

## 2020-02-17 DIAGNOSIS — E538 Deficiency of other specified B group vitamins: Secondary | ICD-10-CM | POA: Diagnosis not present

## 2020-02-17 DIAGNOSIS — R61 Generalized hyperhidrosis: Secondary | ICD-10-CM | POA: Diagnosis not present

## 2020-02-17 DIAGNOSIS — D649 Anemia, unspecified: Secondary | ICD-10-CM | POA: Diagnosis not present

## 2020-02-17 DIAGNOSIS — R531 Weakness: Secondary | ICD-10-CM | POA: Diagnosis not present

## 2020-02-17 DIAGNOSIS — D801 Nonfamilial hypogammaglobulinemia: Secondary | ICD-10-CM | POA: Diagnosis not present

## 2020-02-17 LAB — BASIC METABOLIC PANEL
Anion gap: 10 (ref 5–15)
BUN: 20 mg/dL (ref 8–23)
CO2: 24 mmol/L (ref 22–32)
Calcium: 8.8 mg/dL — ABNORMAL LOW (ref 8.9–10.3)
Chloride: 104 mmol/L (ref 98–111)
Creatinine, Ser: 1.05 mg/dL — ABNORMAL HIGH (ref 0.44–1.00)
GFR calc Af Amer: >60 mL/min (ref >60)
GFR calc non Af Amer: 56 mL/min — ABNORMAL LOW (ref >60)
Glucose, Bld: 123 mg/dL — ABNORMAL HIGH (ref 70–99)
Potassium: 4.2 mmol/L (ref 3.5–5.1)
Sodium: 138 mmol/L (ref 135–145)

## 2020-02-17 LAB — MAGNESIUM: Magnesium: 1.3 mg/dL — ABNORMAL LOW (ref 1.7–2.4)

## 2020-02-17 MED ORDER — HEPARIN SOD (PORK) LOCK FLUSH 100 UNIT/ML IV SOLN
500.0000 [IU] | Freq: Once | INTRAVENOUS | Status: DC
  Administered 2020-02-17: 500 [IU] via INTRAVENOUS
  Filled 2020-02-17: qty 5

## 2020-02-17 MED ORDER — DIPHENHYDRAMINE HCL 25 MG PO TABS
25.0000 mg | ORAL_TABLET | Freq: Once | ORAL | Status: AC
  Administered 2020-02-17: 25 mg via ORAL
  Filled 2020-02-17: qty 1

## 2020-02-17 MED ORDER — IMMUNE GLOBULIN (HUMAN) 10 GM/100ML IV SOLN
15.0000 g | Freq: Once | INTRAVENOUS | Status: AC
  Administered 2020-02-17: 15 g via INTRAVENOUS
  Filled 2020-02-17: qty 150

## 2020-02-17 MED ORDER — SODIUM CHLORIDE 0.9% FLUSH
10.0000 mL | INTRAVENOUS | Status: DC | PRN
  Administered 2020-02-17: 10 mL via INTRAVENOUS
  Filled 2020-02-17: qty 10

## 2020-02-17 MED ORDER — PROMETHAZINE HCL 25 MG/ML IJ SOLN
25.0000 mg | Freq: Once | INTRAMUSCULAR | Status: AC
  Administered 2020-02-17: 25 mg via INTRAVENOUS
  Filled 2020-02-17: qty 1

## 2020-02-17 MED ORDER — ACETAMINOPHEN 325 MG PO TABS
650.0000 mg | ORAL_TABLET | Freq: Once | ORAL | Status: AC
  Administered 2020-02-17: 650 mg via ORAL
  Filled 2020-02-17: qty 2

## 2020-02-17 MED ORDER — SODIUM CHLORIDE 0.9 % IV SOLN
Freq: Once | INTRAVENOUS | Status: AC
  Filled 2020-02-17: qty 250

## 2020-02-17 MED ORDER — DIPHENHYDRAMINE HCL 25 MG PO CAPS
ORAL_CAPSULE | ORAL | Status: AC
  Filled 2020-02-17: qty 1

## 2020-02-17 MED ORDER — IMMUNE GLOBULIN (HUMAN) 10 GM/100ML IV SOLN
400.0000 mg/kg | Freq: Once | INTRAVENOUS | Status: DC
  Filled 2020-02-17: qty 250

## 2020-02-17 MED ORDER — SODIUM CHLORIDE 0.9 % IV SOLN
6.0000 g | Freq: Once | INTRAVENOUS | Status: AC
  Administered 2020-02-17: 6 g via INTRAVENOUS
  Filled 2020-02-17: qty 12

## 2020-02-17 NOTE — Progress Notes (Signed)
02/17/20  Received order to lower today's dose of IVIG to 15 gm and infuse over 4 hours.    T.O. Dr Hillery Hunter, PharmD

## 2020-02-18 ENCOUNTER — Other Ambulatory Visit: Payer: Self-pay

## 2020-02-18 ENCOUNTER — Inpatient Hospital Stay: Payer: Medicare PPO

## 2020-02-18 VITALS — BP 122/76 | HR 74 | Temp 98.8°F

## 2020-02-18 DIAGNOSIS — Z5112 Encounter for antineoplastic immunotherapy: Secondary | ICD-10-CM | POA: Diagnosis not present

## 2020-02-18 DIAGNOSIS — R110 Nausea: Secondary | ICD-10-CM | POA: Diagnosis not present

## 2020-02-18 DIAGNOSIS — R61 Generalized hyperhidrosis: Secondary | ICD-10-CM | POA: Diagnosis not present

## 2020-02-18 DIAGNOSIS — C9000 Multiple myeloma not having achieved remission: Secondary | ICD-10-CM | POA: Diagnosis not present

## 2020-02-18 DIAGNOSIS — D801 Nonfamilial hypogammaglobulinemia: Secondary | ICD-10-CM | POA: Diagnosis not present

## 2020-02-18 DIAGNOSIS — R531 Weakness: Secondary | ICD-10-CM | POA: Diagnosis not present

## 2020-02-18 DIAGNOSIS — D649 Anemia, unspecified: Secondary | ICD-10-CM | POA: Diagnosis not present

## 2020-02-18 DIAGNOSIS — G629 Polyneuropathy, unspecified: Secondary | ICD-10-CM | POA: Diagnosis not present

## 2020-02-18 DIAGNOSIS — E538 Deficiency of other specified B group vitamins: Secondary | ICD-10-CM | POA: Diagnosis not present

## 2020-02-18 LAB — IGG, IGA, IGM
IgA: 22 mg/dL — ABNORMAL LOW (ref 87–352)
IgG (Immunoglobin G), Serum: 418 mg/dL — ABNORMAL LOW (ref 586–1602)
IgM (Immunoglobulin M), Srm: 6 mg/dL — ABNORMAL LOW (ref 26–217)

## 2020-02-18 MED ORDER — DEXAMETHASONE 4 MG PO TABS: ORAL_TABLET | ORAL | 0 refills | Status: DC

## 2020-02-18 MED ORDER — HEPARIN SOD (PORK) LOCK FLUSH 100 UNIT/ML IV SOLN
500.0000 [IU] | Freq: Once | INTRAVENOUS | Status: AC | PRN
  Administered 2020-02-18: 500 [IU]
  Filled 2020-02-18: qty 5

## 2020-02-18 MED ORDER — SODIUM CHLORIDE 0.9 % IV SOLN
Freq: Once | INTRAVENOUS | Status: AC
  Filled 2020-02-18: qty 250

## 2020-02-18 NOTE — Progress Notes (Signed)
Per Dr. Mike Gip give 500cc NS over one hour.

## 2020-02-20 NOTE — Progress Notes (Addendum)
[facility]  [address], [address] [place], [place] [address] Phone: [phone]  Fax: [phone]   Clinic Day:  02/21/2020   Referring physician: Glean Hess, MD  Chief Complaint: Sarah [name] is a 64 y.o. 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   HPI: The patient was last seen in the medical oncology clinic on 02/07/2020. At that time, she felt "fine".  She had chronic nausea which appeared slightly worse.  She was fatigued and planed to see physical therapy in preparation for autologous stem cell transplant.  Exam was stable.  She received day 1 of cycle #5 daratumumab, Pomalyst and Decadron.  She received IV magnesium on 02/10/2020, 02/14/2020, and 02/17/2020.   Magnesium: ranged between 1.2-1.3.  She received B12 on 02/10/2020.  She received IVIG on 02/17/2020.  Pretreatment IgG level was 418.  Dose was decreased, infusion time lengthened (4 hours), and IVF given pre and post secondary to prior history of acute renal insufficiency following IVIG (creatinine 1.12 on 01/20/2020 to 2.11 on 07/26/021).  During the interim, she has felt better.  She is taking hair and nail vitamins.  She is limiting her activity secondary to her back.  She had constipation for 1 week.  Nausea is a stable and controlled with Phenergan.  She notes a little knot on her back by her gluteal fold.  She has a little pain under her left breast.  She states that she has a note from her dentist at [facility] that once her plate is not rocking back and forth that she can receive Zometa or Xgeva.  She states that her plate is "now lined up".   Past Medical History:  Diagnosis Date  . Abnormal stress test 02/14/2016   Overview:  Added automatically from request for surgery [id number]  . Anemia   . Anxiety   . Arthritis   . Bicuspid aortic valve   . CHF  (congestive heart failure) ([place])   . CKD (chronic kidney disease) stage 3, GFR 30-59 ml/min   . Depression   . Diabetes mellitus ([place])   . Dizziness   . Fatty liver   . Frequent falls   . GERD (gastroesophageal reflux disease)   . Gout   . Heart murmur   . History of blood transfusion   . History of bone marrow transplant ([place])   . History of uterine fibroid   . Hx of cardiac catheterization 06/05/2016   Overview:  Normal coronaries 2017  . Hypertension   . Hypomagnesemia   . Multiple myeloma ([place])   . Personal history of chemotherapy   . Renal cyst     Past Surgical History:  Procedure Laterality Date  . ABDOMINAL HYSTERECTOMY    . 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 CARDIAC ELECTROPHYSIOLOGY MAPPING AND ABLATION    . CARPAL TUNNEL RELEASE Bilateral   . CHOLECYSTECTOMY  2008  . COLONOSCOPY WITH PROPOFOL N/A 05/07/2017   Procedure: COLONOSCOPY WITH PROPOFOL;  Surgeon: [name], MD;  Location: [facility] ENDOSCOPY;  Service: Gastroenterology;  Laterality: N/A;  . ESOPHAGOGASTRODUODENOSCOPY (EGD) WITH PROPOFOL N/A 05/07/2017   Procedure: ESOPHAGOGASTRODUODENOSCOPY (EGD) WITH PROPOFOL;  Surgeon: [name], MD;  Location: [facility] ENDOSCOPY;  Service: Gastroenterology;  Laterality: N/A;  . FOOT SURGERY Bilateral   . INCONTINENCE SURGERY  2009  . INTERSTIM IMPLANT PLACEMENT    . other  over active bladder  . OTHER SURGICAL HISTORY     bladder stimulator   . PARTIAL HYSTERECTOMY  03/1996   fibroids  . PORTA CATH INSERTION N/A 03/10/2019   Procedure: PORTA CATH INSERTION;  Surgeon: Algernon Huxley, MD;  Location: [place] CV LAB;  Service: Cardiovascular;  Laterality: N/A;  . TONSILLECTOMY  2007    Family History  Problem Relation Age of Onset  . Colon cancer Father   . Renal Disease Father   . Diabetes Mellitus II Father   . Melanoma Paternal Grandmother   . Breast cancer Maternal Aunt 10  . Anemia Mother   . Heart disease Mother   . Heart failure Mother   . Renal  Disease Mother   . Congestive Heart Failure Mother   . Heart disease Maternal Uncle   . Throat cancer Maternal Uncle   . Lung cancer Maternal Uncle   . Liver disease Maternal Uncle   . Heart failure Maternal Uncle   . Hearing loss Son 26       Suicide     Social History:  reports that she quit smoking about 28 years ago. Her smoking use included cigarettes. She has a 20.00 pack-year smoking history. She has never used smokeless tobacco. She reports previous alcohol use. She reports that she does not use drugs. Patient has not had ETOH in several months. She is on disability. She notes exposure to perchloroethylene [facility]).She lives much of her adult life in [place]. She was married with 2 sons. Her husband passed away. Her 2 sons took their own lives (age 91 and 59). She worked in [employer] in [employer]. She went back to school and earned an [facility] and works for [employer] for several years.She liveswith her sistersin [place]. The patient is alone today.   Allergies:  Allergies  Allergen Reactions  . Oxycodone-Acetaminophen Anaphylaxis    Swelling and rash  . Celebrex [Celecoxib] Diarrhea  . Codeine   . Plerixafor     In 2016 during ASCT collection patient developed fever to 103.59F and required hospitalization  . Benadryl [Diphenhydramine] Palpitations  . Morphine Itching and Rash  . Ondansetron Diarrhea  . Tylenol [Acetaminophen] Itching and Rash    Current Medications: Current Outpatient Medications  Medication Sig Dispense Refill  . acetaminophen (TYLENOL) 500 MG tablet Take 500 mg by mouth every 6 (six) hours as needed.     Marland Kitchen acyclovir (ZOVIRAX) 400 MG tablet Take 1 tablet (400 mg total) by mouth 2 (two) times daily. 60 tablet 11  . allopurinol (ZYLOPRIM) 100 MG tablet TAKE 1 TABLET(100 MG) BY MOUTH DAILY as needed 90 tablet 1  . aspirin 81 MG chewable tablet Chew 1 tablet (81 mg total) by mouth daily. 30 tablet 0  . dexamethasone (DECADRON) 4 MG  tablet Take 5 tablets (67m) on days 2, 16 of cycle 3,4, 5 and 6 Take 5 tablets (242m on days 2 of cycles 7 and beyond 50 tablet 0  . diclofenac sodium (VOLTAREN) 1 % GEL Apply 2 g topically 4 (four) times daily. 100 g 1  . diphenhydrAMINE (BENADRYL) 50 MG capsule Take 50 mg by mouth as needed.     . fentaNYL (DURAGESIC - DOSED MCG/HR) 25 MCG/HR patch Place 25 mcg onto the skin every 3 (three) days.     . fexofenadine (ALLEGRA) 180 MG tablet TAKE 1 TABLET(180 MG) BY MOUTH DAILY 30 tablet 5  . FLUoxetine (PROZAC) 40 MG capsule TAKE 1 CAPSULE EVERY DAY 90  capsule 1  . fluticasone (FLONASE) 50 MCG/ACT nasal spray Place 2 sprays into both nostrils daily. 16 g 2  . furosemide (LASIX) 20 MG tablet Take 20 mg by mouth as needed.     Marland Kitchen glucose blood (ONE TOUCH ULTRA TEST) test strip     . hydrOXYzine (ATARAX/VISTARIL) 10 MG tablet TAKE 1 TABLET(10 MG) BY MOUTH THREE TIMES DAILY AS NEEDED 90 tablet 0  . lactulose (CHRONULAC) 10 GM/15ML solution     . lidocaine-prilocaine (EMLA) cream APPLY TOPICALLY AS NEEDED. 30 g 0  . metFORMIN (GLUCOPHAGE-XR) 500 MG 24 hr tablet TAKE 1 TABLET (500 MG TOTAL) BY MOUTH DAILY WITH BREAKFAST. 90 tablet 1  . montelukast (SINGULAIR) 10 MG tablet Take 1 tablet by mouth on the day before treatment and for 2 days after treatment. 30 tablet 0  . Multiple Vitamin (MULTIVITAMIN) tablet Take 1 tablet by mouth daily.    . Multiple Vitamins-Minerals (HAIR/SKIN/NAILS) TABS Take 1 tablet by mouth daily.    Marland Kitchen NARCAN 4 MG/0.1ML LIQD nasal spray kit 1 spray.     Marland Kitchen omeprazole (PRILOSEC) 40 MG capsule TAKE 1 CAPSULE EVERY DAY 90 capsule 3  . pomalidomide (POMALYST) 3 MG capsule TAKE 1 CAPSULE BY MOUTH EVERY DAY   Rem's # [id number] Date obtained: 01/05/2020 21 capsule 0  . promethazine (PHENERGAN) 25 MG tablet Take 1 tablet (25 mg total) by mouth every 6 (six) hours as needed for nausea or vomiting. 90 tablet 0  . tiZANidine (ZANAFLEX) 4 MG tablet Take 1 tablet (4 mg total) by mouth every 8  (eight) hours as needed for muscle spasms. 270 tablet 1  . traZODone (DESYREL) 100 MG tablet TAKE 1 TABLET AT BEDTIME  FOR  SLEEP 90 tablet 0  . diphenoxylate-atropine (LOMOTIL) 2.5-0.025 MG tablet Take 2 tablets by mouth daily as needed for diarrhea or loose stools. (Patient not taking: Reported on 02/21/2020) 45 tablet 2  . lisinopril (ZESTRIL) 10 MG tablet Take 10 mg by mouth once. (Patient not taking: Reported on 02/07/2020)    . promethazine-dextromethorphan (PROMETHAZINE-DM) 6.25-15 MG/5ML syrup Take 5 mLs by mouth 4 (four) times daily as needed for cough. (Patient not taking: Reported on 02/21/2020) 240 mL 0   No current facility-administered medications for this visit.   Facility-Administered Medications Ordered in Other Visits  Medication Dose Route Frequency Provider Last Rate Last Admin  . 0.9 %  sodium chloride infusion   Intravenous Continuous Lequita Asal, MD   Stopped at 01/20/20 1232  . daratumumab-hyaluronidase-fihj (DARZALEX FASPRO) 1800-30000 MG-UT/15ML chemo SQ injection 1,800 mg  1,800 mg Subcutaneous Once Adilynne Fitzwater C, MD      . dexamethasone (DECADRON) tablet 20 mg  20 mg Oral Once Jhoel Stieg C, MD      . heparin lock flush 100 unit/mL  500 Units Intracatheter Once PRN Mike Gip, Alann Avey C, MD      . magnesium sulfate 6 g in sodium chloride 0.9 % 500 mL  6 g Intravenous Once Lequita Asal, MD 171 mL/hr at 02/21/20 0935 6 g at 02/21/20 0935  . sodium chloride flush (NS) 0.9 % injection 10 mL  10 mL Intravenous PRN Lequita Asal, MD   10 mL at 02/07/20 0815  . sodium chloride flush (NS) 0.9 % injection 10 mL  10 mL Intracatheter PRN Cammie Sickle, MD   10 mL at 02/10/20 0815    Review of Systems  Constitutional: Negative for chills, diaphoresis (night sweats), fever, malaise/fatigue and weight loss (stable).       "  Doing better".  HENT: Positive for congestion. Negative for ear discharge, ear pain, hearing loss, nosebleeds, sinus pain,  sore throat and tinnitus.   Eyes: Negative.  Negative for blurred vision, double vision and photophobia.  Respiratory: Negative.  Negative for cough, hemoptysis, sputum production, shortness of breath and wheezing.   Cardiovascular: Negative.  Negative for chest pain, palpitations and leg swelling.  Gastrointestinal: Positive for constipation (x 1 week) and nausea (after eating). Negative for abdominal pain, blood in stool, diarrhea, heartburn (on Prilosec), melena and vomiting.       Drinking lots of water, always thirsty.  Genitourinary: Negative.  Negative for dysuria, frequency and urgency.  Musculoskeletal: Positive for back pain (chronic; paraspinal tenderness; upper back). Negative for falls, joint pain, myalgias and neck pain.       Little pain underneath breasts.  Skin: Negative.  Negative for itching and rash.  Neurological: Positive for weakness. Negative for dizziness, tingling, sensory change (chronic numbness in fingers and toes, stable) and headaches.  Endo/Heme/Allergies: Negative.  Negative for environmental allergies. Does not bruise/bleed easily.       Diabetes, under good control.  Psychiatric/Behavioral: Negative.  Negative for memory loss. The patient is not nervous/anxious and does not have insomnia.   All other systems reviewed and are negative.   Performance status (ECOG): 1  Vitals Blood pressure 139/83, pulse 85, temperature (!) 96.7 F (35.9 C), temperature source Tympanic, weight 141 lb 6.8 oz (64.1 kg), SpO2 97 %.  Physical Exam Vitals and nursing note reviewed.  Constitutional:      General: She is not in acute distress.    Appearance: She is well-developed. She is not diaphoretic.     Interventions: Face mask in place.  HENT:     Head: Normocephalic and atraumatic.     Comments: Short curly blonde hair.    Mouth/Throat:     Mouth: Mucous membranes are dry.     Pharynx: Oropharynx is clear.  Eyes:     General: No scleral icterus.     Conjunctiva/sclera: Conjunctivae normal.     Pupils: Pupils are equal, round, and reactive to light.     Comments: Glasses. Brown eyes.   Cardiovascular:     Rate and Rhythm: Normal rate and regular rhythm.     Heart sounds: Normal heart sounds. No murmur heard.   Pulmonary:     Effort: Pulmonary effort is normal. No respiratory distress.     Breath sounds: Normal breath sounds. No wheezing or rales.  Chest:     Chest wall: No tenderness.  Abdominal:     General: Bowel sounds are normal.     Palpations: Abdomen is soft. There is no hepatomegaly, splenomegaly or mass.     Tenderness: There is no abdominal tenderness. There is no left CVA tenderness, guarding or rebound.  Musculoskeletal:        General: Tenderness (ankles) present. No swelling. Normal range of motion.     Cervical back: Normal range of motion and neck supple.  Lymphadenopathy:     Head:     Right side of head: No preauricular, posterior auricular or occipital adenopathy.     Left side of head: No preauricular, posterior auricular or occipital adenopathy.     Cervical: No cervical adenopathy.     Upper Body:     Right upper body: No supraclavicular adenopathy.     Left upper body: No supraclavicular adenopathy.     Lower Body: No right inguinal adenopathy. No left inguinal adenopathy.  Skin:    General: Skin is warm and dry.  Neurological:     Mental Status: She is alert and oriented to person, place, and time. Mental status is at baseline.  Psychiatric:        Mood and Affect: Mood normal.        Behavior: Behavior normal.        Thought Content: Thought content normal.        Judgment: Judgment normal.    Infusion on 02/21/2020  Component Date Value Ref Range Status  . Magnesium 02/21/2020 1.1* 1.7 - 2.4 mg/dL Final   Performed at [facility], [address] [place], [place] [address]  . Sodium 02/21/2020 140  135 - 145 mmol/L Final  . Potassium 02/21/2020 3.9  3.5 - 5.1 mmol/L Final  .  Chloride 02/21/2020 104  98 - 111 mmol/L Final  . CO2 02/21/2020 25  22 - 32 mmol/L Final  . Glucose, Bld 02/21/2020 165* 70 - 99 mg/dL Final   Glucose reference range applies only to samples taken after fasting for at least 8 hours.  . BUN 02/21/2020 17  8 - 23 mg/dL Final  . Creatinine, Ser 02/21/2020 1.07* 0.44 - 1.00 mg/dL Final  . Calcium 02/21/2020 9.3  8.9 - 10.3 mg/dL Final  . Total Protein 02/21/2020 7.4  6.5 - 8.1 g/dL Final  . Albumin 02/21/2020 4.3  3.5 - 5.0 g/dL Final  . AST 02/21/2020 26  15 - 41 U/L Final  . ALT 02/21/2020 23  0 - 44 U/L Final  . Alkaline Phosphatase 02/21/2020 71  38 - 126 U/L Final  . Total Bilirubin 02/21/2020 0.3  0.3 - 1.2 mg/dL Final  . GFR calc non Af Amer 02/21/2020 55* >60 mL/min Final  . GFR calc Af Amer 02/21/2020 >60  >60 mL/min Final  . Anion gap 02/21/2020 11  5 - 15 Final   Performed at [facility], [address], [place], [place] [address]  . WBC 02/21/2020 4.9  4.0 - 10.5 K/uL Final  . RBC 02/21/2020 3.74* 3.87 - 5.11 MIL/uL Final  . Hemoglobin 02/21/2020 11.2* 12.0 - 15.0 g/dL Final  . HCT 02/21/2020 33.0* 36 - 46 % Final  . MCV 02/21/2020 88.2  80.0 - 100.0 fL Final  . MCH 02/21/2020 29.9  26.0 - 34.0 pg Final  . MCHC 02/21/2020 33.9  30.0 - 36.0 g/dL Final  . RDW 02/21/2020 12.8  11.5 - 15.5 % Final  . Platelets 02/21/2020 100* 150 - 400 K/uL Final   Comment: Immature Platelet Fraction may be clinically indicated, consider ordering this additional test [id number]   . nRBC 02/21/2020 0.0  0.0 - 0.2 % Final  . Neutrophils Relative % 02/21/2020 73  % Final  . Neutro Abs 02/21/2020 3.6  1.7 - 7.7 K/uL Final  . Lymphocytes Relative 02/21/2020 12  % Final  . Lymphs Abs 02/21/2020 0.6* 0.7 - 4.0 K/uL Final  . Monocytes Relative 02/21/2020 10  % Final  . Monocytes Absolute 02/21/2020 0.5  0 - 1 K/uL Final  . Eosinophils Relative 02/21/2020 4  % Final  . Eosinophils Absolute 02/21/2020 0.2  0 - 0 K/uL Final  . Basophils  Relative 02/21/2020 1  % Final  . Basophils Absolute 02/21/2020 0.0  0 - 0 K/uL Final  . Immature Granulocytes 02/21/2020 0  % Final  . Abs Immature Granulocytes 02/21/2020 0.01  0.00 - 0.07 K/uL Final   Performed at [facility], [address], [place], [place] [address]    Assessment:  Sarah [name] is a 64 y.o. 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 Bone marrow revealed 80% plasma cells. Lambda free light chains were 1340. She had nephrotic range proteinuria. She initially underwent induction with RVD. Revlimid maintenance was discontinued on 01/21/2017 secondary to intolerance.   Bone marrowaspirate andbiopsyon 01/18/2021revealed anormocellularmarrow withbut increased lambda-restricted plasma cells (9% aspirate, 40% CD138 immunohistochemistry).Findingswereconsistent with recurrent plasma cell myeloma.Flow cytometry revealed no monoclonal B-cell or phenotypically aberrant T-cell population. Cytogeneticswere 65, XX (normal). 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  M-spikehas been followed: 0 on 04/02/2016 -06/23/2019; 0.2 on 09/02/2017, 0.1 on 11/25/2019, 0.1 on 01/10/2020, and 0.2 on 02/07/2020.  Lambda light chainshave been followed: 22.2 (ratio 0.56) on 07/03/2017, 30.8 (ratio 0.78) on 09/02/2017, 36.9 (ratio 0.40) on 10/21/2017, 37.4 (ratio 0.41) on 12/16/2017, 70.7(ratio 0.31) on 02/17/2018, 64.2 (ratio 0.27) on 04/07/2018, 78.9 (ratio 0.18) on 05/26/2018, 128.8 (ratio 0.17) on 08/06/2018, 181.5 (ratio 0.13) on 10/08/2018, 130.9 (ratio 0.13) on 10/20/2018, 160.7 (ratio 0.10)on 12/09/2018, 236.6 (ratio 0.07) on 02/01/2019, 363.6 (ratio 0.04) on 03/22/2019, 404.8 (ratio 0.04) on 04/05/2019, 420.7 (ratio 0.03) on 05/24/2019, 573.4 (ratio 0.03) on 06/23/2019, 451.05(ratio 0.02) on02/19/2021, 47.2 (ratio 0.13) on 10/25/2019, 22.4  (ratio 0.21) on 11/25/2019, 16.5 (ratio 0.33) on 01/10/2020, and 14.6 (ratio 0.34) on 02/07/2020.  24 hour UPEPon 06/03/2019 revealed kappa free light chains95.76,lambda free light chains1,260.71, andratio 0.08.24 hourUPEPon 02/22/2021revealed totalproteinof780m/24 hrs withlambdafree light chains 1,084.16mL andratioof0.10(1.03-31.76). M spike inurinewas46.1%(36141m4 hrs).  Bone surveyon 04/08/2016 and11/28/2018 revealed no definite lytic lesion seen in the visualized skeleton.Bone surveyon 11/19/2018 revealed no suspicious lucent lesionsand no acute bony abnormality.PET scanon 07/12/2019 revealed no focal metabolic activity to suggest active myeloma within the skeleton. There wereno lytic lesions identified on the CT portion of the examorsoft tissue plasmacytomas. There was no evidence of multiple myeloma.  Pretreatment RBC phenotypeon 09/23/2019 waspositivefor C, e, DUFFY B, KIDD B, M, S, and s antigen; negativefor c, E, KELL, DUFFY A, KIDD A, and N antigen.  Sheis day 15 ofcycle #5 daratumumab and hyaluronidase-fihj, Pomalyst, and Decadron (DPd)(09/27/2019 - 10/25/2019; 12/09/2019 - 02/07/2020). Cycle #1 was complicated by fever and neutropenia requiring admission.  Cycle #2 was complicated by pneumonia requiring admission.  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 Zometa was discontinued in 01/2017. She has chronic nauseaon Phenergan.  She has B12 deficiency. B12 was 254 on 04/09/2017, 295 on 08/20/2018, and 391 on 10/08/2018. Shewasonoral B12.She received B12 monthly (last08/05/2020).Folate was18.6on 02/08/2019.  She has iron deficiency. Ferritin was 32 on 07/01/2019. She received Venoferon 07/15/2019 and 07/22/2019.  She has hypogammaglobulinemia.  IgG was 245 on 11/25/2019.  She received monthly IVIG (07/22/021 - 02/17/2020).  She received IVIG 400 mg/kg on 01/20/2020 and 200 mg/kg on  02/17/2020.  IVIG on 07/63/33/5456s complicated by acute renal failure. IgG trough level was 418 on 02/17/2020.  [facility] [place] [facility] [place] for fever and neutropenia. Cultures were negative. CXR was negative. She received broad spectrum antibiotics and daily Granix. She received IVF for acute renal insufficiency due to diarrhea and dehydration. Creatine was 1.66 on admission and 1.12 on discharge.  She received her second COVID-19vaccineon 10/09/2019.  Symptomatically, she is feeling better.  She denies any fevers.  She denies any bone pain.  Exam is stable.  Plan: 1.   Labs today: CBC with diff,  CMP, Mg 2. Stage III multiple myeloma Clinically, she is doing fairly well. Lambda free light chains have improved from451.05on02/19/2021.   After cycle #1:  47.2.   After cycle #2:  22.4.   After cycle #3:  16.5.   After cycle #4:  14.6. Labs reviewed.  Begin day 15 of cycle #5 daratumumab plus Pomalyst and dexamethasone (DPd). Neuropathy remains stable.   She appears to have a little more energy..   Continue Pomalyst 2 weeks on and 2 weeks off.             Treatment plan for cycles # 4 - 6 (28 day cycle):                          Pomalidomide (Pomalyst) 3 mg po q day 1-14.                         Decadron 20 mg po day 1-2 and 15-16. Daratumumab and hyaluronidase-fihj (Darzalez Faspro) 1800 mg SQ on days 1 and 15. Premeds for [employer]:  Singulair 10 mg po day before, day of, and 2 days. Phenergan 25 mg IV,Benadryl 50 mg po, andTylenol 650 mg po.  Patient took her premeds today.  Discuss symptom management.  She has antiemetics at home to use on a as needed basis.  Interventions are adequate.   Patient has adequate refill of Phenergan.    She  will receive 3 months of therapy prior to autologous stem cell transplant in order to build up her strength, endurance and weight.  3. Normocytic anemia Hematocrit33.0. Hemoglobin 9.2. MCV88.2.Platelets100,000today. Ferritin51 on 01/10/2020. TSH was normal on02/20/2020.  Creatinine 1.07(CrCl48.72m/minute). Continue to monitor. 4. Grade II peripheral neuropathy  Patient confirms that her neuropathy is stable.  Continue to monitor. 5.B12 deficiency She receives B12 monthly (last 02/10/2020).   Folate 12.1 on 01/10/2020.  Monitor folate annually. 6. Acute renal insufficiency  Creatinine is 1.07 today.  Creatinine 1.04 on 01/17/2020 and 2.11 on 01/24/2020.  Etiology of acute renal insufficiency was felt secondary to IVIG.   IVIG has been associated with renal dysfunction and acute renal failure.   Per Up-to-Date, in patients at risk for renal dysfunction or acute renal failure, ensure adequate hydration prior to administration.   The dose, rate of infusion and concentration should be minimized.  Renal ultrasound revealed no hydronephrosis. 7.   Hypogammaglobulinemia  Patient has had recurrent infections.    Patient received IVIG on 01/20/2020 with subsequent renal insufficiency.  She received her second dose of IVIG on 02/17/2020.   IVIG dose was reduced, infusion time increased and she received pre and post hydration on 02/17/2020   Goal IVIG trough level is 500.   Advance dose as tolerated 8.   Hypomagnesemia, chronic Magnesium1.1 today. Magnesium 6 g IV today.  She receives half dose if she has renal insufficiency. Continue IV magnesium supplementation twice weekly (Mondays and Thursdays)..Marland Kitchen  9.Nausea  Chronic nausea is controlled with oral Phenergan.  Nausea is better controlled with IV Phenergan per  patient.  Continue Phenergan IV twice weekly when patient returns for treatment. 10.   Patient states that she was cleared by dentistry for Zometa (we should have a letter). 11.   Preauth Zometa. 12.   Magnesium 6 gm IV today. 13.   Day 15 of cycle #5 daratumumab plus Pomalyst and dexamethasone (DPd) today. 14.   RTC on 08/26, 08/30, and 09/02 for labs (Mg)  and +/- IV Mg 15.   On 02/24/2020, add BMP and +/- Zometa.  16.   On 08/30 add CBC with diff. 17.   RTC on 03/06/2020 for MD assessment, labs (CBC with diff, CMP, Mg, SPEP, FLCA), day 1 of cycle #6 daratumumab and IV Mg.  I discussed the assessment and treatment plan with the patient.  The patient was provided an opportunity to ask questions and all were answered.  The patient agreed with the plan and demonstrated an understanding of the instructions.  The patient was advised to call back if the symptoms worsen or if the condition fails to improve as anticipated.   Lequita Asal, MD, PhD    02/21/2020, 10:07 AM   I, Jacqualyn Posey, am acting as a Education administrator for [employer]. Mike Gip, MD.   I, Alura Olveda C. Mike Gip, MD, have reviewed the above documentation for accuracy and completeness, and I agree with the above.

## 2020-02-21 ENCOUNTER — Other Ambulatory Visit: Payer: Self-pay

## 2020-02-21 ENCOUNTER — Inpatient Hospital Stay: Payer: Medicare PPO

## 2020-02-21 ENCOUNTER — Encounter: Payer: Self-pay | Admitting: Hematology and Oncology

## 2020-02-21 ENCOUNTER — Inpatient Hospital Stay (HOSPITAL_BASED_OUTPATIENT_CLINIC_OR_DEPARTMENT_OTHER): Payer: Medicare PPO | Admitting: Hematology and Oncology

## 2020-02-21 VITALS — BP 123/63 | HR 80

## 2020-02-21 VITALS — BP 139/83 | HR 85 | Temp 96.7°F | Wt 141.4 lb

## 2020-02-21 DIAGNOSIS — D801 Nonfamilial hypogammaglobulinemia: Secondary | ICD-10-CM | POA: Diagnosis not present

## 2020-02-21 DIAGNOSIS — R110 Nausea: Secondary | ICD-10-CM

## 2020-02-21 DIAGNOSIS — C9000 Multiple myeloma not having achieved remission: Secondary | ICD-10-CM

## 2020-02-21 DIAGNOSIS — R531 Weakness: Secondary | ICD-10-CM | POA: Diagnosis not present

## 2020-02-21 DIAGNOSIS — T451X5A Adverse effect of antineoplastic and immunosuppressive drugs, initial encounter: Secondary | ICD-10-CM | POA: Diagnosis not present

## 2020-02-21 DIAGNOSIS — E538 Deficiency of other specified B group vitamins: Secondary | ICD-10-CM | POA: Diagnosis not present

## 2020-02-21 DIAGNOSIS — Z5112 Encounter for antineoplastic immunotherapy: Secondary | ICD-10-CM

## 2020-02-21 DIAGNOSIS — G620 Drug-induced polyneuropathy: Secondary | ICD-10-CM | POA: Diagnosis not present

## 2020-02-21 DIAGNOSIS — D649 Anemia, unspecified: Secondary | ICD-10-CM | POA: Diagnosis not present

## 2020-02-21 DIAGNOSIS — Z9484 Stem cells transplant status: Secondary | ICD-10-CM

## 2020-02-21 DIAGNOSIS — G629 Polyneuropathy, unspecified: Secondary | ICD-10-CM | POA: Diagnosis not present

## 2020-02-21 DIAGNOSIS — R61 Generalized hyperhidrosis: Secondary | ICD-10-CM | POA: Diagnosis not present

## 2020-02-21 DIAGNOSIS — Z95828 Presence of other vascular implants and grafts: Secondary | ICD-10-CM

## 2020-02-21 LAB — COMPREHENSIVE METABOLIC PANEL
ALT: 23 U/L (ref 0–44)
AST: 26 U/L (ref 15–41)
Albumin: 4.3 g/dL (ref 3.5–5.0)
Alkaline Phosphatase: 71 U/L (ref 38–126)
Anion gap: 11 (ref 5–15)
BUN: 17 mg/dL (ref 8–23)
CO2: 25 mmol/L (ref 22–32)
Calcium: 9.3 mg/dL (ref 8.9–10.3)
Chloride: 104 mmol/L (ref 98–111)
Creatinine, Ser: 1.07 mg/dL — ABNORMAL HIGH (ref 0.44–1.00)
GFR calc Af Amer: >60 mL/min (ref >60)
GFR calc non Af Amer: 55 mL/min — ABNORMAL LOW (ref >60)
Glucose, Bld: 165 mg/dL — ABNORMAL HIGH (ref 70–99)
Potassium: 3.9 mmol/L (ref 3.5–5.1)
Sodium: 140 mmol/L (ref 135–145)
Total Bilirubin: 0.3 mg/dL (ref 0.3–1.2)
Total Protein: 7.4 g/dL (ref 6.5–8.1)

## 2020-02-21 LAB — CBC WITH DIFFERENTIAL/PLATELET
Abs Immature Granulocytes: 0.01 10*3/uL (ref 0.00–0.07)
Basophils Absolute: 0 10*3/uL (ref 0.0–0.1)
Basophils Relative: 1 %
Eosinophils Absolute: 0.2 10*3/uL (ref 0.0–0.5)
Eosinophils Relative: 4 %
HCT: 33 % — ABNORMAL LOW (ref 36.0–46.0)
Hemoglobin: 11.2 g/dL — ABNORMAL LOW (ref 12.0–15.0)
Immature Granulocytes: 0 %
Lymphocytes Relative: 12 %
Lymphs Abs: 0.6 10*3/uL — ABNORMAL LOW (ref 0.7–4.0)
MCH: 29.9 pg (ref 26.0–34.0)
MCHC: 33.9 g/dL (ref 30.0–36.0)
MCV: 88.2 fL (ref 80.0–100.0)
Monocytes Absolute: 0.5 10*3/uL (ref 0.1–1.0)
Monocytes Relative: 10 %
Neutro Abs: 3.6 10*3/uL (ref 1.7–7.7)
Neutrophils Relative %: 73 %
Platelets: 100 10*3/uL — ABNORMAL LOW (ref 150–400)
RBC: 3.74 MIL/uL — ABNORMAL LOW (ref 3.87–5.11)
RDW: 12.8 % (ref 11.5–15.5)
WBC: 4.9 10*3/uL (ref 4.0–10.5)
nRBC: 0 % (ref 0.0–0.2)

## 2020-02-21 LAB — MAGNESIUM: Magnesium: 1.1 mg/dL — ABNORMAL LOW (ref 1.7–2.4)

## 2020-02-21 MED ORDER — SODIUM CHLORIDE 0.9% FLUSH
10.0000 mL | INTRAVENOUS | Status: DC | PRN
  Administered 2020-02-21: 10 mL via INTRAVENOUS
  Filled 2020-02-21: qty 10

## 2020-02-21 MED ORDER — SODIUM CHLORIDE 0.9 % IV SOLN
Freq: Once | INTRAVENOUS | Status: AC
  Filled 2020-02-21: qty 250

## 2020-02-21 MED ORDER — SODIUM CHLORIDE 0.9 % IV SOLN
6.0000 g | Freq: Once | INTRAVENOUS | Status: AC
  Administered 2020-02-21: 6 g via INTRAVENOUS
  Filled 2020-02-21: qty 12

## 2020-02-21 MED ORDER — DARATUMUMAB-HYALURONIDASE-FIHJ 1800-30000 MG-UT/15ML ~~LOC~~ SOLN
1800.0000 mg | Freq: Once | SUBCUTANEOUS | Status: AC
  Administered 2020-02-21: 1800 mg via SUBCUTANEOUS
  Filled 2020-02-21: qty 15

## 2020-02-21 MED ORDER — PROMETHAZINE HCL 25 MG/ML IJ SOLN
25.0000 mg | Freq: Once | INTRAMUSCULAR | Status: AC
  Administered 2020-02-21: 25 mg via INTRAVENOUS
  Filled 2020-02-21: qty 1

## 2020-02-21 MED ORDER — DEXAMETHASONE 4 MG PO TABS
20.0000 mg | ORAL_TABLET | Freq: Once | ORAL | Status: AC
  Administered 2020-02-21: 20 mg via ORAL
  Filled 2020-02-21: qty 5

## 2020-02-21 MED ORDER — HEPARIN SOD (PORK) LOCK FLUSH 100 UNIT/ML IV SOLN
500.0000 [IU] | Freq: Once | INTRAVENOUS | Status: AC | PRN
  Administered 2020-02-21: 500 [IU]
  Filled 2020-02-21: qty 5

## 2020-02-21 NOTE — Progress Notes (Signed)
The patient c/o constipation  

## 2020-02-23 ENCOUNTER — Other Ambulatory Visit: Payer: Self-pay

## 2020-02-23 ENCOUNTER — Ambulatory Visit: Payer: Medicare PPO | Admitting: Physical Therapy

## 2020-02-23 ENCOUNTER — Encounter: Payer: Self-pay | Admitting: Physical Therapy

## 2020-02-23 DIAGNOSIS — G894 Chronic pain syndrome: Secondary | ICD-10-CM

## 2020-02-23 DIAGNOSIS — R200 Anesthesia of skin: Secondary | ICD-10-CM | POA: Diagnosis not present

## 2020-02-23 DIAGNOSIS — R202 Paresthesia of skin: Secondary | ICD-10-CM | POA: Diagnosis not present

## 2020-02-23 DIAGNOSIS — M6281 Muscle weakness (generalized): Secondary | ICD-10-CM | POA: Diagnosis not present

## 2020-02-23 NOTE — Patient Instructions (Signed)
Access Code: [email or web address]: [email or web address]: 08/25/2021Prepared by: Legrand Como SherkExercises  Supine Bridge - 1 x daily - 7 x weekly - 3 sets - 10 reps  Sit to Stand without Arm Support - 1 x daily - 7 x weekly - 3 sets - 10 reps  Standing Marching - 1 x daily - 7 x weekly - 3 sets - 10 reps  Hooklying Transversus Abdominis Palpation - 1 x daily - 7 x weekly - 3 sets - 10 reps  Clamshell - 1 x daily - 7 x weekly - 3 sets - 10 reps

## 2020-02-23 NOTE — Therapy (Signed)
[facility] [address] [place], [place], [address] Phone: [phone]   Fax:  [phone]  Physical Therapy Treatment  Patient Details  Name: Sarah [name] MRN: [id number] Date of Birth: [birth year] Referring Provider (PT): Nolon Stalls, MD   Encounter Date: 02/23/2020   PT End of Session - 02/23/20 0735    Visit Number 3    Number of Visits 9    Date for PT Re-Evaluation 04/04/20    Authorization - Visit Number 3    Authorization - Number of Visits 10    PT Start Time 0731    PT Stop Time 0832    PT Time Calculation (min) 61 min    Activity Tolerance Patient tolerated treatment well    Behavior During Therapy [facility] for tasks assessed/performed           Past Medical History:  Diagnosis Date  . Abnormal stress test 02/14/2016   Overview:  Added automatically from request for surgery [id number]  . Anemia   . Anxiety   . Arthritis   . Bicuspid aortic valve   . CHF (congestive heart failure) ([place])   . CKD (chronic kidney disease) stage 3, GFR 30-59 ml/min   . Depression   . Diabetes mellitus ([place])   . Dizziness   . Fatty liver   . Frequent falls   . GERD (gastroesophageal reflux disease)   . Gout   . Heart murmur   . History of blood transfusion   . History of bone marrow transplant ([place])   . History of uterine fibroid   . Hx of cardiac catheterization 06/05/2016   Overview:  Normal coronaries 2017  . Hypertension   . Hypomagnesemia   . Multiple myeloma ([place])   . Personal history of chemotherapy   . Renal cyst     Past Surgical History:  Procedure Laterality Date  . ABDOMINAL HYSTERECTOMY    . 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 CARDIAC ELECTROPHYSIOLOGY MAPPING AND ABLATION    . CARPAL TUNNEL RELEASE Bilateral   . CHOLECYSTECTOMY  2008  . COLONOSCOPY WITH PROPOFOL N/A 05/07/2017   Procedure: COLONOSCOPY WITH PROPOFOL;  Surgeon: [name], MD;  Location: [facility] ENDOSCOPY;  Service: Gastroenterology;   Laterality: N/A;  . ESOPHAGOGASTRODUODENOSCOPY (EGD) WITH PROPOFOL N/A 05/07/2017   Procedure: ESOPHAGOGASTRODUODENOSCOPY (EGD) WITH PROPOFOL;  Surgeon: [name], MD;  Location: [facility] ENDOSCOPY;  Service: Gastroenterology;  Laterality: N/A;  . FOOT SURGERY Bilateral   . INCONTINENCE SURGERY  2009  . INTERSTIM IMPLANT PLACEMENT    . other     over active bladder  . OTHER SURGICAL HISTORY     bladder stimulator   . PARTIAL HYSTERECTOMY  03/1996   fibroids  . PORTA CATH INSERTION N/A 03/10/2019   Procedure: PORTA CATH INSERTION;  Surgeon: Algernon Huxley, MD;  Location: [place] CV LAB;  Service: Cardiovascular;  Laterality: N/A;  . TONSILLECTOMY  2007    There were no vitals filed for this visit.   Subjective Assessment - 02/23/20 0733    Subjective Pt. states that R low back 5/10. Pt. states that she is feeling tired today.    Pertinent History pt has multiple myeloma, diagnosed in 2016. has bladder stimulator.    Patient Stated Goals Increase strength    Currently in Pain? Yes    Pain Score 5     Pain Location Back    Pain Orientation Right    Pain Onset More than a month ago  There Ex:  Nustep L2 10 mins  TrA Activation supine: 10x. Pt cued to feel for contraction 2" in from ASIS.  Posterior pelvic tilt: 10x  TrA activation with heel slides: 10x each leg. Tactile cueing under posterior iliac crest   TrA activation supine bridges: 10x  STS: 2x5 reps. Cueing for proper technique for first 5. Cueing for increased speed to improve 5xSTS.   Seated 4lb LAQ: 10x each leg  Standing Marches in // bars: 3lbs ankle weight. 10x each leg  Supine clamshell demonstration: pt completed 4x clamshell in sidelying to demonstrate exercise for HEP  Pt given HEP with LE strengthening exercises. See Patient Education for details.       PT Education - 02/23/20 0937    Education Details See HEP    Person(s) Educated Patient    Methods  Explanation;Demonstration;Handout    Comprehension Verbalized understanding;Returned demonstration               PT Long Term Goals - 02/08/20 1239      PT LONG TERM GOAL #1   Title Pt. will be able to walk for 10 minutes without pain increasing above 3/10 in low back to improve functional mobility.    Baseline initial: pt can only walk 3-5 mins before onset of pain 6-7/10.    Time 8    Period Weeks    Status New    Target Date 04/04/20      PT LONG TERM GOAL #2   Title Pt will improve 5XSTS to 15 seconds or less to improve overall strength and functional mobility.    Baseline initial: 25.57s    Time 8    Period Weeks    Status New    Target Date 04/04/20      PT LONG TERM GOAL #3   Title Pt will improve global strength to at least 4/5 to improve functional mobility.    Baseline R/L 4/4 Traps, 4-/4- Shoulder abduction, 3/4 Shoulder ER, 3/4 Shoulder IR, 4/4+ Biceps, 3+/4 Triceps, 3+/3+ Hip flexion, 4-/4- Hip abduction, 5/5 Hip adduction (seated), 5/5 Knee extension, 4-/4- Knee flexion, 4/4 Ankle dorsiflexion    Time 8    Period Weeks    Status New    Target Date 04/04/20             Plan - 02/23/20 0825    Clinical Impression Statement Pt. reports that her R side back pain was assessed by MD. MD wants to monitor spot but pt reports MD is not concerned at this time about it. Pt. sill experiencing 5/10 back pain in that spot. Pt. taken through core and LE strengthening exercises and given new HEP. Pt. able to demonstrate all exercises with good form. Pt. will continue to benefit from skilled PT to improve ability to complete ADLs and functional mobility.    Personal Factors and Comorbidities Comorbidity 2;Comorbidity 1    Comorbidities cancer    Stability/Clinical Decision Making Evolving/Moderate complexity    Clinical Decision Making Moderate    Rehab Potential Good    PT Frequency 1x / week    PT Duration 8 weeks    PT Treatment/Interventions ADLs/Self Care Home  Management;Cryotherapy;Neuromuscular re-education;Balance training;Therapeutic exercise;Therapeutic activities;Functional mobility training;Gait training;Patient/family education;Manual techniques    PT Next Visit Plan core progression, LE nerve glides    PT Home Exercise Plan [email or web address]    Consulted and Agree with Plan of Care Patient           Patient will benefit from skilled  therapeutic intervention in order to improve the following deficits and impairments:  Abnormal gait, Decreased activity tolerance, Decreased balance, Decreased mobility, Decreased strength, Impaired sensation, Postural dysfunction, Difficulty walking  Visit Diagnosis: Muscle weakness (generalized)  Chronic pain syndrome  Numbness and tingling     Problem List Patient Active Problem List   Diagnosis Date Noted  . Physical deconditioning 02/07/2020  . Impaired nutrition 02/07/2020  . 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 Acute renal insufficiency 01/24/2020  . Sepsis ([place]) 11/13/2019  . Pneumonia of right lung due to infectious organism 11/13/2019  . Constipation 10/24/2019  . AKI (acute kidney injury) ([place]) 10/15/2019  . Neutropenic fever ([place]) 10/15/2019  . Febrile neutropenia ([place]) 10/15/2019  . Encounter for antineoplastic immunotherapy 10/03/2019  . Hypocalcemia 08/28/2019  . Stage 3 chronic kidney disease 05/12/2019  . Chronic nausea 12/17/2018  . Chemotherapy-induced neuropathy ([place]) 12/17/2018  . Hyperlipidemia associated with type 2 diabetes mellitus ([place]) 11/25/2018  . Normocytic anemia 10/21/2018  . Hypomagnesemia 08/20/2018  . Goals of care, counseling/discussion 08/20/2018  . B12 deficiency 08/20/2018  . Thrombocytopenia ([place]) 02/09/2018  . Benign essential HTN 08/21/2017  . Cardiomyopathy, idiopathic ([place]) 08/21/2017  . Carotid artery stenosis, asymptomatic, bilateral 08/06/2017  . Thyroid nodule 08/06/2017  . Cardiac syncope 07/25/2017  . Iron deficiency anemia due to chronic  blood loss 05/13/2017  . Spinal stenosis of lumbar region with neurogenic claudication 04/09/2017  . 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 LBBB (left bundle branch block) 10/10/2016  . Long term prescription opiate use 09/07/2016  . Chronic pain syndrome 07/08/2016  . Pain medication agreement signed 07/08/2016  . Spondylosis of lumbar region without myelopathy or radiculopathy 07/08/2016  . Chronic diastolic CHF (congestive heart failure), NYHA class 2 ([place]) 06/05/2016  . LVH (left ventricular hypertrophy) due to hypertensive disease, with heart failure ([place]) 06/05/2016  . Mild aortic valve stenosis 06/05/2016  . 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[place]) 06/05/2016  . Environmental and seasonal allergies 04/19/2016  . Insomnia 04/19/2016  . Bicuspid aortic valve 04/19/2016  . Asthma 04/19/2016  . Aortic regurgitation 04/19/2016  . Type II diabetes mellitus with complication ([place]) 64/38/3818  . Depression, major, single episode, in partial remission ([place]) 04/12/2016  . Irritable bowel syndrome (IBS) 04/12/2016  . Hepatic steatosis 04/12/2016  . Multiple myeloma not having achieved remission ([place]) 04/02/2016  . 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[place]) 07/06/2015  . Stem cell transplant candidate 05/16/2015  . Disequilibrium 01/14/2013   Pura Spice, PT, DPT # [id number] Carlyle Basques, SPT 02/23/2020, 9:45 AM  [facility] [address] [place], [place], [address] Phone: [phone]   Fax:  [phone]  Name: Normajean Nash MRN: [id number] Date of Birth: December 23, 1955

## 2020-02-24 ENCOUNTER — Inpatient Hospital Stay: Payer: Medicare PPO

## 2020-02-24 VITALS — BP 134/74 | HR 80 | Temp 97.2°F | Resp 18

## 2020-02-24 DIAGNOSIS — R531 Weakness: Secondary | ICD-10-CM | POA: Diagnosis not present

## 2020-02-24 DIAGNOSIS — R61 Generalized hyperhidrosis: Secondary | ICD-10-CM | POA: Diagnosis not present

## 2020-02-24 DIAGNOSIS — C9000 Multiple myeloma not having achieved remission: Secondary | ICD-10-CM

## 2020-02-24 DIAGNOSIS — Z5112 Encounter for antineoplastic immunotherapy: Secondary | ICD-10-CM | POA: Diagnosis not present

## 2020-02-24 DIAGNOSIS — E538 Deficiency of other specified B group vitamins: Secondary | ICD-10-CM | POA: Diagnosis not present

## 2020-02-24 DIAGNOSIS — D801 Nonfamilial hypogammaglobulinemia: Secondary | ICD-10-CM | POA: Diagnosis not present

## 2020-02-24 DIAGNOSIS — G629 Polyneuropathy, unspecified: Secondary | ICD-10-CM | POA: Diagnosis not present

## 2020-02-24 DIAGNOSIS — R110 Nausea: Secondary | ICD-10-CM | POA: Diagnosis not present

## 2020-02-24 DIAGNOSIS — D649 Anemia, unspecified: Secondary | ICD-10-CM | POA: Diagnosis not present

## 2020-02-24 LAB — MAGNESIUM: Magnesium: 1.1 mg/dL — ABNORMAL LOW (ref 1.7–2.4)

## 2020-02-24 LAB — BASIC METABOLIC PANEL
Anion gap: 11 (ref 5–15)
BUN: 26 mg/dL — ABNORMAL HIGH (ref 8–23)
CO2: 25 mmol/L (ref 22–32)
Calcium: 9.1 mg/dL (ref 8.9–10.3)
Chloride: 101 mmol/L (ref 98–111)
Creatinine, Ser: 0.99 mg/dL (ref 0.44–1.00)
GFR calc Af Amer: >60 mL/min (ref >60)
GFR calc non Af Amer: >60 mL/min (ref >60)
Glucose, Bld: 110 mg/dL — ABNORMAL HIGH (ref 70–99)
Potassium: 4.1 mmol/L (ref 3.5–5.1)
Sodium: 137 mmol/L (ref 135–145)

## 2020-02-24 MED ORDER — SODIUM CHLORIDE 0.9 % IV SOLN
6.0000 g | Freq: Once | INTRAVENOUS | Status: AC
  Administered 2020-02-24: 6 g via INTRAVENOUS
  Filled 2020-02-24: qty 12

## 2020-02-24 MED ORDER — SODIUM CHLORIDE 0.9% FLUSH
10.0000 mL | Freq: Once | INTRAVENOUS | Status: AC | PRN
  Administered 2020-02-24: 10 mL
  Filled 2020-02-24: qty 10

## 2020-02-24 MED ORDER — SODIUM CHLORIDE 0.9 % IV SOLN
Freq: Once | INTRAVENOUS | Status: AC
  Filled 2020-02-24: qty 250

## 2020-02-24 MED ORDER — PROMETHAZINE HCL 25 MG/ML IJ SOLN
25.0000 mg | Freq: Once | INTRAMUSCULAR | Status: AC
  Administered 2020-02-24: 25 mg via INTRAVENOUS
  Filled 2020-02-24: qty 1

## 2020-02-24 MED ORDER — ZOLEDRONIC ACID 4 MG/100ML IV SOLN
4.0000 mg | Freq: Once | INTRAVENOUS | Status: AC
  Administered 2020-02-24: 4 mg via INTRAVENOUS
  Filled 2020-02-24: qty 100

## 2020-02-24 MED ORDER — HEPARIN SOD (PORK) LOCK FLUSH 100 UNIT/ML IV SOLN
500.0000 [IU] | Freq: Once | INTRAVENOUS | Status: AC | PRN
  Administered 2020-02-24: 500 [IU]
  Filled 2020-02-24: qty 5

## 2020-02-25 DIAGNOSIS — Z9484 Stem cells transplant status: Secondary | ICD-10-CM | POA: Diagnosis not present

## 2020-02-25 DIAGNOSIS — R5383 Other fatigue: Secondary | ICD-10-CM | POA: Diagnosis not present

## 2020-02-25 DIAGNOSIS — G629 Polyneuropathy, unspecified: Secondary | ICD-10-CM | POA: Diagnosis not present

## 2020-02-25 DIAGNOSIS — Z885 Allergy status to narcotic agent status: Secondary | ICD-10-CM | POA: Diagnosis not present

## 2020-02-25 DIAGNOSIS — Z87891 Personal history of nicotine dependence: Secondary | ICD-10-CM | POA: Diagnosis not present

## 2020-02-25 DIAGNOSIS — C9000 Multiple myeloma not having achieved remission: Secondary | ICD-10-CM | POA: Diagnosis not present

## 2020-02-25 DIAGNOSIS — C9002 Multiple myeloma in relapse: Secondary | ICD-10-CM | POA: Diagnosis not present

## 2020-02-25 DIAGNOSIS — Z886 Allergy status to analgesic agent status: Secondary | ICD-10-CM | POA: Diagnosis not present

## 2020-02-25 DIAGNOSIS — D801 Nonfamilial hypogammaglobulinemia: Secondary | ICD-10-CM | POA: Diagnosis not present

## 2020-02-28 ENCOUNTER — Other Ambulatory Visit: Payer: Self-pay

## 2020-02-28 ENCOUNTER — Inpatient Hospital Stay: Payer: Medicare PPO

## 2020-02-28 VITALS — BP 119/71 | HR 79 | Temp 97.4°F | Resp 18

## 2020-02-28 DIAGNOSIS — M87180 Osteonecrosis due to drugs, jaw: Secondary | ICD-10-CM | POA: Diagnosis not present

## 2020-02-28 DIAGNOSIS — E538 Deficiency of other specified B group vitamins: Secondary | ICD-10-CM | POA: Diagnosis not present

## 2020-02-28 DIAGNOSIS — D84821 Immunodeficiency due to drugs: Secondary | ICD-10-CM | POA: Diagnosis present

## 2020-02-28 DIAGNOSIS — Z8249 Family history of ischemic heart disease and other diseases of the circulatory system: Secondary | ICD-10-CM | POA: Diagnosis not present

## 2020-02-28 DIAGNOSIS — Z833 Family history of diabetes mellitus: Secondary | ICD-10-CM | POA: Diagnosis not present

## 2020-02-28 DIAGNOSIS — Z1152 Encounter for screening for COVID-19: Secondary | ICD-10-CM | POA: Diagnosis not present

## 2020-02-28 DIAGNOSIS — R509 Fever, unspecified: Secondary | ICD-10-CM | POA: Diagnosis not present

## 2020-02-28 DIAGNOSIS — N183 Chronic kidney disease, stage 3 unspecified: Secondary | ICD-10-CM | POA: Diagnosis not present

## 2020-02-28 DIAGNOSIS — R0600 Dyspnea, unspecified: Secondary | ICD-10-CM | POA: Diagnosis present

## 2020-02-28 DIAGNOSIS — Z5112 Encounter for antineoplastic immunotherapy: Secondary | ICD-10-CM | POA: Diagnosis present

## 2020-02-28 DIAGNOSIS — R5081 Fever presenting with conditions classified elsewhere: Secondary | ICD-10-CM | POA: Diagnosis present

## 2020-02-28 DIAGNOSIS — F419 Anxiety disorder, unspecified: Secondary | ICD-10-CM | POA: Diagnosis not present

## 2020-02-28 DIAGNOSIS — D649 Anemia, unspecified: Secondary | ICD-10-CM | POA: Diagnosis not present

## 2020-02-28 DIAGNOSIS — Q231 Congenital insufficiency of aortic valve: Secondary | ICD-10-CM | POA: Diagnosis not present

## 2020-02-28 DIAGNOSIS — G8929 Other chronic pain: Secondary | ICD-10-CM | POA: Diagnosis not present

## 2020-02-28 DIAGNOSIS — C9000 Multiple myeloma not having achieved remission: Secondary | ICD-10-CM | POA: Diagnosis present

## 2020-02-28 DIAGNOSIS — Z95828 Presence of other vascular implants and grafts: Secondary | ICD-10-CM

## 2020-02-28 DIAGNOSIS — M899 Disorder of bone, unspecified: Secondary | ICD-10-CM | POA: Diagnosis not present

## 2020-02-28 DIAGNOSIS — I959 Hypotension, unspecified: Secondary | ICD-10-CM | POA: Diagnosis not present

## 2020-02-28 DIAGNOSIS — D61810 Antineoplastic chemotherapy induced pancytopenia: Secondary | ICD-10-CM | POA: Diagnosis not present

## 2020-02-28 DIAGNOSIS — T451X5A Adverse effect of antineoplastic and immunosuppressive drugs, initial encounter: Secondary | ICD-10-CM | POA: Diagnosis present

## 2020-02-28 DIAGNOSIS — R809 Proteinuria, unspecified: Secondary | ICD-10-CM | POA: Diagnosis not present

## 2020-02-28 DIAGNOSIS — F324 Major depressive disorder, single episode, in partial remission: Secondary | ICD-10-CM | POA: Diagnosis present

## 2020-02-28 DIAGNOSIS — E1122 Type 2 diabetes mellitus with diabetic chronic kidney disease: Secondary | ICD-10-CM | POA: Diagnosis not present

## 2020-02-28 DIAGNOSIS — M549 Dorsalgia, unspecified: Secondary | ICD-10-CM | POA: Diagnosis not present

## 2020-02-28 DIAGNOSIS — R05 Cough: Secondary | ICD-10-CM | POA: Diagnosis not present

## 2020-02-28 DIAGNOSIS — E440 Moderate protein-calorie malnutrition: Secondary | ICD-10-CM | POA: Diagnosis present

## 2020-02-28 DIAGNOSIS — T50995A Adverse effect of other drugs, medicaments and biological substances, initial encounter: Secondary | ICD-10-CM | POA: Diagnosis not present

## 2020-02-28 DIAGNOSIS — A419 Sepsis, unspecified organism: Secondary | ICD-10-CM | POA: Diagnosis not present

## 2020-02-28 DIAGNOSIS — Z9484 Stem cells transplant status: Secondary | ICD-10-CM | POA: Diagnosis not present

## 2020-02-28 DIAGNOSIS — D709 Neutropenia, unspecified: Secondary | ICD-10-CM | POA: Diagnosis present

## 2020-02-28 DIAGNOSIS — Z9481 Bone marrow transplant status: Secondary | ICD-10-CM | POA: Diagnosis not present

## 2020-02-28 DIAGNOSIS — K5900 Constipation, unspecified: Secondary | ICD-10-CM | POA: Diagnosis present

## 2020-02-28 DIAGNOSIS — D801 Nonfamilial hypogammaglobulinemia: Secondary | ICD-10-CM | POA: Diagnosis not present

## 2020-02-28 DIAGNOSIS — G629 Polyneuropathy, unspecified: Secondary | ICD-10-CM | POA: Diagnosis not present

## 2020-02-28 DIAGNOSIS — Z20822 Contact with and (suspected) exposure to covid-19: Secondary | ICD-10-CM | POA: Diagnosis present

## 2020-02-28 DIAGNOSIS — E119 Type 2 diabetes mellitus without complications: Secondary | ICD-10-CM | POA: Diagnosis present

## 2020-02-28 DIAGNOSIS — I10 Essential (primary) hypertension: Secondary | ICD-10-CM | POA: Diagnosis present

## 2020-02-28 DIAGNOSIS — R32 Unspecified urinary incontinence: Secondary | ICD-10-CM | POA: Diagnosis present

## 2020-02-28 DIAGNOSIS — N179 Acute kidney failure, unspecified: Secondary | ICD-10-CM | POA: Diagnosis not present

## 2020-02-28 DIAGNOSIS — N390 Urinary tract infection, site not specified: Secondary | ICD-10-CM | POA: Diagnosis present

## 2020-02-28 DIAGNOSIS — Z800 Family history of malignant neoplasm of digestive organs: Secondary | ICD-10-CM | POA: Diagnosis not present

## 2020-02-28 DIAGNOSIS — R5383 Other fatigue: Secondary | ICD-10-CM | POA: Diagnosis not present

## 2020-02-28 DIAGNOSIS — K760 Fatty (change of) liver, not elsewhere classified: Secondary | ICD-10-CM | POA: Diagnosis present

## 2020-02-28 DIAGNOSIS — R1013 Epigastric pain: Secondary | ICD-10-CM | POA: Diagnosis present

## 2020-02-28 DIAGNOSIS — D701 Agranulocytosis secondary to cancer chemotherapy: Secondary | ICD-10-CM | POA: Diagnosis present

## 2020-02-28 LAB — CBC WITH DIFFERENTIAL/PLATELET
Abs Immature Granulocytes: 0.01 10*3/uL (ref 0.00–0.07)
Basophils Absolute: 0 10*3/uL (ref 0.0–0.1)
Basophils Relative: 1 %
Eosinophils Absolute: 0.2 10*3/uL (ref 0.0–0.5)
Eosinophils Relative: 7 %
HCT: 31.9 % — ABNORMAL LOW (ref 36.0–46.0)
Hemoglobin: 10.6 g/dL — ABNORMAL LOW (ref 12.0–15.0)
Immature Granulocytes: 0 %
Lymphocytes Relative: 21 %
Lymphs Abs: 0.7 10*3/uL (ref 0.7–4.0)
MCH: 29.2 pg (ref 26.0–34.0)
MCHC: 33.2 g/dL (ref 30.0–36.0)
MCV: 87.9 fL (ref 80.0–100.0)
Monocytes Absolute: 0.9 10*3/uL (ref 0.1–1.0)
Monocytes Relative: 27 %
Neutro Abs: 1.5 10*3/uL — ABNORMAL LOW (ref 1.7–7.7)
Neutrophils Relative %: 44 %
Platelets: 122 10*3/uL — ABNORMAL LOW (ref 150–400)
RBC: 3.63 MIL/uL — ABNORMAL LOW (ref 3.87–5.11)
RDW: 13.2 % (ref 11.5–15.5)
WBC: 3.3 10*3/uL — ABNORMAL LOW (ref 4.0–10.5)
nRBC: 0 % (ref 0.0–0.2)

## 2020-02-28 LAB — MAGNESIUM: Magnesium: 1.3 mg/dL — ABNORMAL LOW (ref 1.7–2.4)

## 2020-02-28 MED ORDER — HEPARIN SOD (PORK) LOCK FLUSH 100 UNIT/ML IV SOLN
500.0000 [IU] | Freq: Once | INTRAVENOUS | Status: AC | PRN
  Administered 2020-02-28: 500 [IU]
  Filled 2020-02-28: qty 5

## 2020-02-28 MED ORDER — PROMETHAZINE HCL 25 MG/ML IJ SOLN
25.0000 mg | Freq: Once | INTRAMUSCULAR | Status: AC
  Administered 2020-02-28: 25 mg via INTRAVENOUS
  Filled 2020-02-28: qty 1

## 2020-02-28 MED ORDER — SODIUM CHLORIDE 0.9 % IV SOLN
Freq: Once | INTRAVENOUS | Status: AC
  Filled 2020-02-28: qty 250

## 2020-02-28 MED ORDER — SODIUM CHLORIDE 0.9 % IV SOLN
6.0000 g | Freq: Once | INTRAVENOUS | Status: AC
  Administered 2020-02-28: 6 g via INTRAVENOUS
  Filled 2020-02-28: qty 12

## 2020-02-28 MED ORDER — SODIUM CHLORIDE 0.9% FLUSH
10.0000 mL | INTRAVENOUS | Status: DC | PRN
  Administered 2020-02-28: 10 mL via INTRAVENOUS
  Filled 2020-02-28: qty 10

## 2020-03-01 ENCOUNTER — Telehealth: Payer: Self-pay

## 2020-03-01 ENCOUNTER — Other Ambulatory Visit: Payer: Self-pay

## 2020-03-01 ENCOUNTER — Ambulatory Visit: Payer: Medicare PPO | Admitting: Physical Therapy

## 2020-03-01 ENCOUNTER — Ambulatory Visit
Admission: RE | Admit: 2020-03-01 | Discharge: 2020-03-01 | Disposition: A | Discharge status: Home / Self Care | Payer: Medicare PPO | Source: Ambulatory Visit | Attending: Oncology | Admitting: Oncology

## 2020-03-01 ENCOUNTER — Other Ambulatory Visit: Payer: Self-pay | Admitting: *Deleted

## 2020-03-01 ENCOUNTER — Inpatient Hospital Stay: Payer: Medicare PPO | Attending: Hematology and Oncology | Admitting: Oncology

## 2020-03-01 ENCOUNTER — Inpatient Hospital Stay
Admission: EM | Admit: 2020-03-01 | Discharge: 2020-03-05 | DRG: 809 | Disposition: A | Discharge status: Home / Self Care | Payer: Medicare PPO | Source: Ambulatory Visit | Attending: Family Medicine | Admitting: Family Medicine

## 2020-03-01 ENCOUNTER — Emergency Department: Payer: Medicare PPO

## 2020-03-01 ENCOUNTER — Inpatient Hospital Stay: Payer: Medicare PPO

## 2020-03-01 ENCOUNTER — Telehealth: Payer: Self-pay | Admitting: *Deleted

## 2020-03-01 ENCOUNTER — Ambulatory Visit
Admission: RE | Admit: 2020-03-01 | Discharge: 2020-03-01 | Disposition: A | Discharge status: Home / Self Care | Payer: Medicare PPO | Source: Home / Self Care | Attending: Oncology | Admitting: Oncology

## 2020-03-01 ENCOUNTER — Encounter: Payer: Self-pay | Admitting: *Deleted

## 2020-03-01 VITALS — BP 133/91 | HR 89 | Temp 100.9°F | Resp 20 | Wt 141.4 lb

## 2020-03-01 DIAGNOSIS — T451X5A Adverse effect of antineoplastic and immunosuppressive drugs, initial encounter: Secondary | ICD-10-CM | POA: Diagnosis present

## 2020-03-01 DIAGNOSIS — F329 Major depressive disorder, single episode, unspecified: Secondary | ICD-10-CM | POA: Insufficient documentation

## 2020-03-01 DIAGNOSIS — Z87891 Personal history of nicotine dependence: Secondary | ICD-10-CM

## 2020-03-01 DIAGNOSIS — R809 Proteinuria, unspecified: Secondary | ICD-10-CM | POA: Insufficient documentation

## 2020-03-01 DIAGNOSIS — Z800 Family history of malignant neoplasm of digestive organs: Secondary | ICD-10-CM

## 2020-03-01 DIAGNOSIS — Z9484 Stem cells transplant status: Secondary | ICD-10-CM | POA: Insufficient documentation

## 2020-03-01 DIAGNOSIS — J811 Chronic pulmonary edema: Secondary | ICD-10-CM

## 2020-03-01 DIAGNOSIS — G629 Polyneuropathy, unspecified: Secondary | ICD-10-CM | POA: Insufficient documentation

## 2020-03-01 DIAGNOSIS — Z7952 Long term (current) use of systemic steroids: Secondary | ICD-10-CM | POA: Insufficient documentation

## 2020-03-01 DIAGNOSIS — Z833 Family history of diabetes mellitus: Secondary | ICD-10-CM

## 2020-03-01 DIAGNOSIS — C9000 Multiple myeloma not having achieved remission: Secondary | ICD-10-CM | POA: Diagnosis not present

## 2020-03-01 DIAGNOSIS — I10 Essential (primary) hypertension: Secondary | ICD-10-CM | POA: Diagnosis present

## 2020-03-01 DIAGNOSIS — R05 Cough: Secondary | ICD-10-CM | POA: Insufficient documentation

## 2020-03-01 DIAGNOSIS — R1013 Epigastric pain: Secondary | ICD-10-CM | POA: Diagnosis present

## 2020-03-01 DIAGNOSIS — Z841 Family history of disorders of kidney and ureter: Secondary | ICD-10-CM

## 2020-03-01 DIAGNOSIS — M87180 Osteonecrosis due to drugs, jaw: Secondary | ICD-10-CM | POA: Diagnosis not present

## 2020-03-01 DIAGNOSIS — Z5112 Encounter for antineoplastic immunotherapy: Secondary | ICD-10-CM | POA: Insufficient documentation

## 2020-03-01 DIAGNOSIS — R32 Unspecified urinary incontinence: Secondary | ICD-10-CM | POA: Diagnosis present

## 2020-03-01 DIAGNOSIS — N179 Acute kidney failure, unspecified: Secondary | ICD-10-CM | POA: Insufficient documentation

## 2020-03-01 DIAGNOSIS — R509 Fever, unspecified: Secondary | ICD-10-CM | POA: Insufficient documentation

## 2020-03-01 DIAGNOSIS — N183 Chronic kidney disease, stage 3 unspecified: Secondary | ICD-10-CM | POA: Insufficient documentation

## 2020-03-01 DIAGNOSIS — M549 Dorsalgia, unspecified: Secondary | ICD-10-CM | POA: Diagnosis not present

## 2020-03-01 DIAGNOSIS — R5081 Fever presenting with conditions classified elsewhere: Secondary | ICD-10-CM | POA: Diagnosis not present

## 2020-03-01 DIAGNOSIS — Z9481 Bone marrow transplant status: Secondary | ICD-10-CM

## 2020-03-01 DIAGNOSIS — K5900 Constipation, unspecified: Secondary | ICD-10-CM | POA: Diagnosis present

## 2020-03-01 DIAGNOSIS — Z803 Family history of malignant neoplasm of breast: Secondary | ICD-10-CM | POA: Insufficient documentation

## 2020-03-01 DIAGNOSIS — R5383 Other fatigue: Secondary | ICD-10-CM | POA: Diagnosis not present

## 2020-03-01 DIAGNOSIS — E119 Type 2 diabetes mellitus without complications: Secondary | ICD-10-CM | POA: Diagnosis present

## 2020-03-01 DIAGNOSIS — Z8249 Family history of ischemic heart disease and other diseases of the circulatory system: Secondary | ICD-10-CM

## 2020-03-01 DIAGNOSIS — I959 Hypotension, unspecified: Secondary | ICD-10-CM | POA: Insufficient documentation

## 2020-03-01 DIAGNOSIS — G8929 Other chronic pain: Secondary | ICD-10-CM | POA: Insufficient documentation

## 2020-03-01 DIAGNOSIS — C9001 Multiple myeloma in remission: Secondary | ICD-10-CM | POA: Diagnosis present

## 2020-03-01 DIAGNOSIS — D701 Agranulocytosis secondary to cancer chemotherapy: Secondary | ICD-10-CM | POA: Diagnosis present

## 2020-03-01 DIAGNOSIS — F419 Anxiety disorder, unspecified: Secondary | ICD-10-CM | POA: Insufficient documentation

## 2020-03-01 DIAGNOSIS — E538 Deficiency of other specified B group vitamins: Secondary | ICD-10-CM | POA: Diagnosis not present

## 2020-03-01 DIAGNOSIS — N390 Urinary tract infection, site not specified: Secondary | ICD-10-CM | POA: Diagnosis present

## 2020-03-01 DIAGNOSIS — M109 Gout, unspecified: Secondary | ICD-10-CM | POA: Diagnosis present

## 2020-03-01 DIAGNOSIS — T50995A Adverse effect of other drugs, medicaments and biological substances, initial encounter: Secondary | ICD-10-CM | POA: Insufficient documentation

## 2020-03-01 DIAGNOSIS — F324 Major depressive disorder, single episode, in partial remission: Secondary | ICD-10-CM | POA: Diagnosis present

## 2020-03-01 DIAGNOSIS — D649 Anemia, unspecified: Secondary | ICD-10-CM | POA: Diagnosis not present

## 2020-03-01 DIAGNOSIS — Z79899 Other long term (current) drug therapy: Secondary | ICD-10-CM

## 2020-03-01 DIAGNOSIS — E118 Type 2 diabetes mellitus with unspecified complications: Secondary | ICD-10-CM | POA: Diagnosis present

## 2020-03-01 DIAGNOSIS — Z808 Family history of malignant neoplasm of other organs or systems: Secondary | ICD-10-CM | POA: Insufficient documentation

## 2020-03-01 DIAGNOSIS — Q231 Congenital insufficiency of aortic valve: Secondary | ICD-10-CM

## 2020-03-01 DIAGNOSIS — D84821 Immunodeficiency due to drugs: Secondary | ICD-10-CM | POA: Diagnosis present

## 2020-03-01 DIAGNOSIS — D709 Neutropenia, unspecified: Secondary | ICD-10-CM

## 2020-03-01 DIAGNOSIS — D61810 Antineoplastic chemotherapy induced pancytopenia: Principal | ICD-10-CM | POA: Diagnosis present

## 2020-03-01 DIAGNOSIS — K760 Fatty (change of) liver, not elsewhere classified: Secondary | ICD-10-CM | POA: Diagnosis present

## 2020-03-01 DIAGNOSIS — R110 Nausea: Secondary | ICD-10-CM | POA: Insufficient documentation

## 2020-03-01 DIAGNOSIS — A419 Sepsis, unspecified organism: Secondary | ICD-10-CM

## 2020-03-01 DIAGNOSIS — E1122 Type 2 diabetes mellitus with diabetic chronic kidney disease: Secondary | ICD-10-CM | POA: Insufficient documentation

## 2020-03-01 DIAGNOSIS — K219 Gastro-esophageal reflux disease without esophagitis: Secondary | ICD-10-CM | POA: Diagnosis present

## 2020-03-01 DIAGNOSIS — Z9221 Personal history of antineoplastic chemotherapy: Secondary | ICD-10-CM | POA: Insufficient documentation

## 2020-03-01 DIAGNOSIS — E440 Moderate protein-calorie malnutrition: Secondary | ICD-10-CM | POA: Insufficient documentation

## 2020-03-01 DIAGNOSIS — M899 Disorder of bone, unspecified: Secondary | ICD-10-CM | POA: Diagnosis not present

## 2020-03-01 DIAGNOSIS — Z801 Family history of malignant neoplasm of trachea, bronchus and lung: Secondary | ICD-10-CM | POA: Insufficient documentation

## 2020-03-01 DIAGNOSIS — D801 Nonfamilial hypogammaglobulinemia: Secondary | ICD-10-CM | POA: Diagnosis not present

## 2020-03-01 DIAGNOSIS — Z7984 Long term (current) use of oral hypoglycemic drugs: Secondary | ICD-10-CM

## 2020-03-01 DIAGNOSIS — Z1152 Encounter for screening for COVID-19: Secondary | ICD-10-CM | POA: Diagnosis not present

## 2020-03-01 DIAGNOSIS — R0600 Dyspnea, unspecified: Secondary | ICD-10-CM

## 2020-03-01 DIAGNOSIS — Z8379 Family history of other diseases of the digestive system: Secondary | ICD-10-CM | POA: Insufficient documentation

## 2020-03-01 DIAGNOSIS — R079 Chest pain, unspecified: Secondary | ICD-10-CM | POA: Insufficient documentation

## 2020-03-01 DIAGNOSIS — Z9049 Acquired absence of other specified parts of digestive tract: Secondary | ICD-10-CM | POA: Insufficient documentation

## 2020-03-01 DIAGNOSIS — Z20822 Contact with and (suspected) exposure to covid-19: Secondary | ICD-10-CM | POA: Diagnosis present

## 2020-03-01 DIAGNOSIS — B373 Candidiasis of vulva and vagina: Secondary | ICD-10-CM | POA: Insufficient documentation

## 2020-03-01 DIAGNOSIS — Z822 Family history of deafness and hearing loss: Secondary | ICD-10-CM | POA: Insufficient documentation

## 2020-03-01 DIAGNOSIS — M1990 Unspecified osteoarthritis, unspecified site: Secondary | ICD-10-CM | POA: Diagnosis present

## 2020-03-01 DIAGNOSIS — B957 Other staphylococcus as the cause of diseases classified elsewhere: Secondary | ICD-10-CM | POA: Diagnosis present

## 2020-03-01 DIAGNOSIS — Z79891 Long term (current) use of opiate analgesic: Secondary | ICD-10-CM

## 2020-03-01 LAB — COMPREHENSIVE METABOLIC PANEL
ALT: 23 U/L (ref 0–44)
ALT: 23 U/L (ref 0–44)
AST: 26 U/L (ref 15–41)
AST: 25 U/L (ref 15–41)
Albumin: 4.2 g/dL (ref 3.5–5.0)
Albumin: 4 g/dL (ref 3.5–5.0)
Alkaline Phosphatase: 85 U/L (ref 38–126)
Alkaline Phosphatase: 74 U/L (ref 38–126)
Anion gap: 9 (ref 5–15)
Anion gap: 9 (ref 5–15)
BUN: 14 mg/dL (ref 8–23)
BUN: 12 mg/dL (ref 8–23)
CO2: 25 mmol/L (ref 22–32)
CO2: 23 mmol/L (ref 22–32)
Calcium: 8.3 mg/dL — ABNORMAL LOW (ref 8.9–10.3)
Calcium: 8.5 mg/dL — ABNORMAL LOW (ref 8.9–10.3)
Chloride: 105 mmol/L (ref 98–111)
Chloride: 105 mmol/L (ref 98–111)
Creatinine, Ser: 1.03 mg/dL — ABNORMAL HIGH (ref 0.44–1.00)
Creatinine, Ser: 0.88 mg/dL (ref 0.44–1.00)
GFR calc Af Amer: >60 mL/min (ref >60)
GFR calc Af Amer: >60 mL/min (ref >60)
GFR calc non Af Amer: 58 mL/min — ABNORMAL LOW (ref >60)
GFR calc non Af Amer: >60 mL/min (ref >60)
Glucose, Bld: 110 mg/dL — ABNORMAL HIGH (ref 70–99)
Glucose, Bld: 112 mg/dL — ABNORMAL HIGH (ref 70–99)
Potassium: 4.2 mmol/L (ref 3.5–5.1)
Potassium: 4.3 mmol/L (ref 3.5–5.1)
Sodium: 139 mmol/L (ref 135–145)
Sodium: 137 mmol/L (ref 135–145)
Total Bilirubin: 0.2 mg/dL — ABNORMAL LOW (ref 0.3–1.2)
Total Bilirubin: 0.4 mg/dL (ref 0.3–1.2)
Total Protein: 7.1 g/dL (ref 6.5–8.1)
Total Protein: 6.8 g/dL (ref 6.5–8.1)

## 2020-03-01 LAB — URINALYSIS, COMPLETE (UACMP) WITH MICROSCOPIC
Bilirubin Urine: NEGATIVE
Glucose, UA: NEGATIVE mg/dL
Hgb urine dipstick: NEGATIVE
Ketones, ur: NEGATIVE mg/dL
Nitrite: POSITIVE — AB
Protein, ur: 30 mg/dL — AB
Specific Gravity, Urine: 1.015 (ref 1.005–1.030)
pH: 6 (ref 5.0–8.0)

## 2020-03-01 LAB — CBC WITH DIFFERENTIAL/PLATELET
Abs Immature Granulocytes: 0.01 10*3/uL (ref 0.00–0.07)
Abs Immature Granulocytes: 0 10*3/uL (ref 0.00–0.07)
Basophils Absolute: 0 10*3/uL (ref 0.0–0.1)
Basophils Absolute: 0 10*3/uL (ref 0.0–0.1)
Basophils Relative: 1 %
Basophils Relative: 2 %
Eosinophils Absolute: 0.1 10*3/uL (ref 0.0–0.5)
Eosinophils Absolute: 0.1 10*3/uL (ref 0.0–0.5)
Eosinophils Relative: 4 %
Eosinophils Relative: 3 %
HCT: 30.5 % — ABNORMAL LOW (ref 36.0–46.0)
HCT: 31.1 % — ABNORMAL LOW (ref 36.0–46.0)
Hemoglobin: 10.4 g/dL — ABNORMAL LOW (ref 12.0–15.0)
Hemoglobin: 10.2 g/dL — ABNORMAL LOW (ref 12.0–15.0)
Immature Granulocytes: 0 %
Immature Granulocytes: 0 %
Lymphocytes Relative: 34 %
Lymphocytes Relative: 32 %
Lymphs Abs: 0.8 10*3/uL (ref 0.7–4.0)
Lymphs Abs: 0.9 10*3/uL (ref 0.7–4.0)
MCH: 29.8 pg (ref 26.0–34.0)
MCH: 29.6 pg (ref 26.0–34.0)
MCHC: 34.1 g/dL (ref 30.0–36.0)
MCHC: 32.8 g/dL (ref 30.0–36.0)
MCV: 87.4 fL (ref 80.0–100.0)
MCV: 90.1 fL (ref 80.0–100.0)
Monocytes Absolute: 0.8 10*3/uL (ref 0.1–1.0)
Monocytes Absolute: 0.9 10*3/uL (ref 0.1–1.0)
Monocytes Relative: 32 %
Monocytes Relative: 32 %
Neutro Abs: 0.7 10*3/uL — ABNORMAL LOW (ref 1.7–7.7)
Neutro Abs: 0.8 10*3/uL — ABNORMAL LOW (ref 1.7–7.7)
Neutrophils Relative %: 29 %
Neutrophils Relative %: 31 %
Platelets: 150 10*3/uL (ref 150–400)
Platelets: 157 10*3/uL (ref 150–400)
RBC: 3.49 MIL/uL — ABNORMAL LOW (ref 3.87–5.11)
RBC: 3.45 MIL/uL — ABNORMAL LOW (ref 3.87–5.11)
RDW: 13.2 % (ref 11.5–15.5)
RDW: 13.2 % (ref 11.5–15.5)
WBC: 2.4 10*3/uL — ABNORMAL LOW (ref 4.0–10.5)
WBC: 2.6 10*3/uL — ABNORMAL LOW (ref 4.0–10.5)
nRBC: 0 % (ref 0.0–0.2)
nRBC: 0 % (ref 0.0–0.2)

## 2020-03-01 LAB — LACTIC ACID, PLASMA
Lactic Acid, Venous: 0.8 mmol/L (ref 0.5–1.9)
Lactic Acid, Venous: 1.3 mmol/L (ref 0.5–1.9)

## 2020-03-01 MED ORDER — SODIUM CHLORIDE 0.9 % IV SOLN
Freq: Once | INTRAVENOUS | Status: AC
  Filled 2020-03-01: qty 250

## 2020-03-01 MED ORDER — VANCOMYCIN HCL IN DEXTROSE 1-5 GM/200ML-% IV SOLN
1000.0000 mg | Freq: Once | INTRAVENOUS | Status: AC
  Administered 2020-03-02: 1000 mg via INTRAVENOUS
  Filled 2020-03-01: qty 200

## 2020-03-01 MED ORDER — LACTATED RINGERS IV BOLUS (SEPSIS)
1000.0000 mL | Freq: Once | INTRAVENOUS | Status: AC
  Administered 2020-03-02: 1000 mL via INTRAVENOUS

## 2020-03-01 MED ORDER — METRONIDAZOLE IN NACL 5-0.79 MG/ML-% IV SOLN
500.0000 mg | Freq: Once | INTRAVENOUS | Status: AC
  Administered 2020-03-02: 500 mg via INTRAVENOUS
  Filled 2020-03-01: qty 100

## 2020-03-01 MED ORDER — LACTATED RINGERS IV SOLN: INTRAVENOUS | Status: AC

## 2020-03-01 MED ORDER — DROPERIDOL 2.5 MG/ML IJ SOLN
1.2500 mg | Freq: Once | INTRAMUSCULAR | Status: AC
  Administered 2020-03-02: 1.25 mg via INTRAVENOUS
  Filled 2020-03-01: qty 2

## 2020-03-01 MED ORDER — HEPARIN SOD (PORK) LOCK FLUSH 100 UNIT/ML IV SOLN
500.0000 [IU] | Freq: Once | INTRAVENOUS | Status: DC
  Filled 2020-03-01: qty 5

## 2020-03-01 MED ORDER — SODIUM CHLORIDE 0.9 % IV SOLN
2.0000 g | Freq: Once | INTRAVENOUS | Status: AC
  Administered 2020-03-01: 2 g via INTRAVENOUS
  Filled 2020-03-01: qty 2

## 2020-03-01 MED ORDER — IBUPROFEN 400 MG PO TABS
400.0000 mg | ORAL_TABLET | Freq: Once | ORAL | Status: AC
  Administered 2020-03-02: 400 mg via ORAL
  Filled 2020-03-01: qty 1

## 2020-03-01 MED ORDER — LACTATED RINGERS IV BOLUS (SEPSIS)
1000.0000 mL | Freq: Once | INTRAVENOUS | Status: AC
  Administered 2020-03-01: 1000 mL via INTRAVENOUS

## 2020-03-01 MED ORDER — PROMETHAZINE HCL 25 MG/ML IJ SOLN
25.0000 mg | Freq: Once | INTRAMUSCULAR | Status: AC
  Administered 2020-03-01: 25 mg via INTRAVENOUS

## 2020-03-01 MED ORDER — SODIUM CHLORIDE 0.9% FLUSH
10.0000 mL | INTRAVENOUS | Status: DC | PRN
  Administered 2020-03-01: 10 mL via INTRAVENOUS
  Filled 2020-03-01: qty 10

## 2020-03-01 NOTE — Progress Notes (Signed)
Symptom Management Consult note [facility]  [phone] [phone]  Patient Care Team: Glean Hess, MD as PCP - General (Internal Medicine) Jodell Cipro, MD as Referring Physician (Pain Medicine) Corey Skains, MD as Consulting Physician (Cardiology) Lequita Asal, MD as Referring Physician (Hematology and Oncology)   Name of the patient: Sarah [name]  [id number]  Oct 09, 1955   Date of visit: 03/01/2020   Diagnosis-multiple myeloma  Chief complaint/ Reason for visit-fever/cough  Heme/Onc history:  Oncology History Overview Note  # June 2017- [facility] MULTIPLE MYELOMA [BMBx- 80% plasma cells; [facility]]; [facility] light chain 1340; s/p RVD   # 14th DEC 2016- Auto-Stem cell transplant [[facility];Dr.Hertzig]; rev [dose reduced sec to [place]  # Maintenance Revlimid- 54m 3 w-ON & 1 week OFF; HELD July 2018- sec to ONJ  # Bone lesions-numerous ~556mlytic lesions in Thoracic spine- on Zometa  # 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  # "Colitis" s/p multiple EGD/Colonoscopies; EGD- patchy inflammation/colo- Nov 2018 [Dr.Anna]  DIAGNOSIS: Multiple myeloma  GOALS: Control/palliative  CURRENT/MOST RECENT THERAPY -surveillance    Multiple myeloma not having achieved remission ([facility] 09/27/2019 -  Chemotherapy   The patient had dexamethasone (DECADRON) tablet 20 mg, 20 mg (100 % of original dose 20 mg), Oral,  Once, 5 of 7 cycles Dose modification: 20 mg (original dose 20 mg, Cycle 1) Administration: 20 mg (09/27/2019), 20 mg (10/04/2019), 20 mg (10/11/2019), 20 mg (10/25/2019), 20 mg (11/01/2019), 20 mg (11/08/2019), 20 mg (12/09/2019), 20 mg (12/16/2019), 20 mg (12/23/2019), 20 mg (01/10/2020), 20 mg (01/17/2020), 20 mg (02/07/2020), 20 mg  (02/21/2020) dexamethasone (DECADRON) 4 MG tablet, 4 mg, Oral, Daily, 2 of 2 cycles Dose modification:   (Cycle 1) daratumumab-hyaluronidase-fihj (DARZALEX FASPRO) 1800-30000 MG-UT/15ML chemo SQ injection 1,800 mg, 1,800 mg, Subcutaneous,  Once, 5 of 7 cycles Administration: 1,800 mg (09/27/2019), 1,800 mg (10/04/2019), 1,800 mg (10/11/2019), 1,800 mg (10/25/2019), 1,800 mg (11/01/2019), 1,800 mg (11/08/2019), 1,800 mg (12/09/2019), 1,800 mg (12/16/2019), 1,800 mg (12/23/2019), 1,800 mg (01/10/2020), 1,800 mg (01/17/2020), 1,800 mg (02/07/2020), 1,800 mg (02/21/2020)  for chemotherapy treatment.       Interval history-Sarah [name] a [age]ear old female with past medical history significant for diabetes, depression, hypertension, chronically low magnesium levels with multiple myeloma status [place]VD induction-Revlimid maintenance discontinued in 2018 secondary to intolerance.  Repeat bone marrow in January 2021 were consistent with recurrent plasma cell myeloma.  She is currently on daratumumab, Pomalyst and Decadron.  Cycle 1 was complicated by fever and neutropenia requiring admission.  Cycle 2 was complicated by pneumonia requiring admission.  She is status post 5 cycles last given on 02/21/2020.  She is vaccinated against COVID-19.  Today, she presents with complaints of cough and fever.  Symptoms have been present for approximately 5 days.  Sputum is yellow in color and thick in consistency. Tmax at home was 99.9.  Fever relieved with Tylenol.  Cough is intermittent and worse at bedtime.  Denies any known sick contacts.  Admits to eating out at [employer] on Saturday morning.  Endorses wearing a mask.  Has intermittent nausea secondary to treatment for which she uses Phenergan. She denies any vomiting, constipation or diarrhea. She has been off Pomalyst since  last week. Last daratumumab was on 02/21/2020.   ECOG FS:1 - Symptomatic but completely  ambulatory  Review of systems- Review of Systems  Constitutional: Positive for fever and malaise/fatigue. Negative for chills and weight loss.  HENT: Positive for congestion and sinus pain. Negative for ear pain and tinnitus.   Eyes: Negative.  Negative for blurred vision and double vision.  Respiratory: Negative.  Negative for cough, sputum production and shortness of breath.   Cardiovascular: Negative.  Negative for chest pain, palpitations and leg swelling.  Gastrointestinal: Negative.  Negative for abdominal pain, constipation, diarrhea, nausea and vomiting.  Genitourinary: Negative for dysuria, frequency and urgency.  Musculoskeletal: Negative for back pain and falls.  Skin: Negative.  Negative for rash.  Neurological: Positive for weakness. Negative for headaches.  Endo/Heme/Allergies: Negative.  Does not bruise/bleed easily.  Psychiatric/Behavioral: Negative.  Negative for depression. The patient is not nervous/anxious and does not have insomnia.      Current treatment-status post cycle 5 daratumumab, Pomalyst and Decadron.  Allergies  Allergen Reactions  . Oxycodone-Acetaminophen Anaphylaxis    Swelling and rash  . Celebrex [Celecoxib] Diarrhea  . Codeine   . Plerixafor     In 2016 during ASCT collection patient developed fever to 103.28F and required hospitalization  . Benadryl [Diphenhydramine] Palpitations  . Morphine Itching and Rash  . Ondansetron Diarrhea  . Tylenol [Acetaminophen] Itching and Rash     Past Medical History:  Diagnosis Date  . Abnormal stress test 02/14/2016   Overview:  Added automatically from request for surgery [id number]  . Anemia   . Anxiety   . Arthritis   . Bicuspid aortic valve   . CHF (congestive heart failure) ([place])   . CKD (chronic kidney disease) stage 3, GFR 30-59 ml/min   . Depression   . Diabetes mellitus ([place])   . Dizziness   . Fatty liver   . Frequent falls   . GERD (gastroesophageal reflux disease)   . Gout   . Heart murmur    . History of blood transfusion   . History of bone marrow transplant ([place])   . History of uterine fibroid   . Hx of cardiac catheterization 06/05/2016   Overview:  Normal coronaries 2017  . Hypertension   . Hypomagnesemia   . Multiple myeloma ([place])   . Personal history of chemotherapy   . Renal cyst      Past Surgical History:  Procedure Laterality Date  . ABDOMINAL HYSTERECTOMY    . 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 CARDIAC ELECTROPHYSIOLOGY MAPPING AND ABLATION    . CARPAL TUNNEL RELEASE Bilateral   . CHOLECYSTECTOMY  2008  . COLONOSCOPY WITH PROPOFOL N/A 05/07/2017   Procedure: COLONOSCOPY WITH PROPOFOL;  Surgeon: [name], MD;  Location[facility] ENDOSCOPY;  Service: Gastroenterology;  Laterality: N/A;  . ESOPHAGOGASTRODUODENOSCOPY (EGD) WITH PROPOFOL N/A 05/07/2017   Procedure: ESOPHAGOGASTRODUODENOSCOPY (EGD) WITH PROPOFOL;  Surgeon: [name], MD;  Location: [facility] ENDOSCOPY;  Service: Gastroenterology;  Laterality: N/A;  . FOOT SURGERY Bilateral   . INCONTINENCE SURGERY  2009  . INTERSTIM IMPLANT PLACEMENT    . other     over active bladder  . OTHER SURGICAL HISTORY     bladder stimulator   . PARTIAL HYSTERECTOMY  03/1996   fibroids  . PORTA CATH INSERTION N/A 03/10/2019   Procedure: PORTA CATH INSERTION;  Surgeon: Algernon Huxley, MD;  Location: [facility];  Service: Cardiovascular;  Laterality: N/A;  . TONSILLECTOMY  2007  Social History   Socioeconomic History  . Marital status: Widowed    Spouse name: Not on file  . Number of children: Not on file  . Years of education: Not on file  . Highest education level: Not on file  Occupational History  . Occupation: Disabled  Tobacco Use  . Smoking status: Former Smoker    Packs/day: 1.00    Years: 20.00    Pack years: 20.00    Types: Cigarettes    Quit date: 07/02/1991    Years since quitting: 28.6  . Smokeless tobacco: Never Used  Vaping Use  . Vaping Use: Never used  Substance and Sexual  Activity  . Alcohol use: Not Currently  . Drug use: No  . Sexual activity: Never  Other Topics Concern  . Not on file  Social History Narrative   Pt lives with her 2 sisters   Social Determinants of Health   Financial Resource Strain: Medium Risk  . Difficulty of Paying Living Expenses: Somewhat hard  Food Insecurity: No Food Insecurity  . Worried About Charity fundraiser in the Last Year: Never true  . Ran Out of Food in the Last Year: Never true  Transportation Needs: No Transportation Needs  . Lack of Transportation (Medical): No  . Lack of Transportation (Non-Medical): No  Physical Activity: Inactive  . Days of Exercise per Week: 0 days  . Minutes of Exercise per Session: 0 min  Stress: Stress Concern Present  . Feeling of Stress : Rather much  Social Connections: Unknown  . Frequency of Communication with Friends and Family: Not on file  . Frequency of Social Gatherings with Friends and Family: Not on file  . Attends Religious Services: Not on file  . Active Member of Clubs or Organizations: Not on file  . Attends Archivist Meetings: Not on file  . Marital Status: Widowed  Intimate Partner Violence: Not At Risk  . Fear of Current or Ex-Partner: No  . Emotionally Abused: No  . Physically Abused: No  . Sexually Abused: No    Family History  Problem Relation Age of Onset  . Colon cancer Father   . Renal Disease Father   . Diabetes Mellitus II Father   . Melanoma Paternal Grandmother   . Breast cancer Maternal Aunt 7  . Anemia Mother   . Heart disease Mother   . Heart failure Mother   . Renal Disease Mother   . Congestive Heart Failure Mother   . Heart disease Maternal Uncle   . Throat cancer Maternal Uncle   . Lung cancer Maternal Uncle   . Liver disease Maternal Uncle   . Heart failure Maternal Uncle   . Hearing loss Son 18       Suicide      Current Outpatient Medications:  .  acetaminophen (TYLENOL) 500 MG tablet, Take 500 mg by mouth  every 6 (six) hours as needed. , Disp: , Rfl:  .  acyclovir (ZOVIRAX) 400 MG tablet, Take 1 tablet (400 mg total) by mouth 2 (two) times daily., Disp: 60 tablet, Rfl: 11 .  allopurinol (ZYLOPRIM) 100 MG tablet, TAKE 1 TABLET(100 MG) BY MOUTH DAILY as needed, Disp: 90 tablet, Rfl: 1 .  aspirin 81 MG chewable tablet, Chew 1 tablet (81 mg total) by mouth daily., Disp: 30 tablet, Rfl: 0 .  dexamethasone (DECADRON) 4 MG tablet, Take 5 tablets (40m) on days 2, 16 of cycle 3,4, 5 and 6 Take 5 tablets (233m on days  2 of cycles 7 and beyond, Disp: 50 tablet, Rfl: 0 .  diclofenac sodium (VOLTAREN) 1 % GEL, Apply 2 g topically 4 (four) times daily., Disp: 100 g, Rfl: 1 .  diphenhydrAMINE (BENADRYL) 50 MG capsule, Take 50 mg by mouth as needed. , Disp: , Rfl:  .  fentaNYL (DURAGESIC - DOSED MCG/HR) 25 MCG/HR patch, Place 25 mcg onto the skin every 3 (three) days. , Disp: , Rfl:  .  fexofenadine (ALLEGRA) 180 MG tablet, TAKE 1 TABLET(180 MG) BY MOUTH DAILY, Disp: 30 tablet, Rfl: 5 .  FLUoxetine (PROZAC) 40 MG capsule, TAKE 1 CAPSULE EVERY DAY, Disp: 90 capsule, Rfl: 1 .  fluticasone (FLONASE) 50 MCG/ACT nasal spray, Place 2 sprays into both nostrils daily., Disp: 16 g, Rfl: 2 .  glucose blood (ONE TOUCH ULTRA TEST) test strip, , Disp: , Rfl:  .  lidocaine-prilocaine (EMLA) cream, APPLY TOPICALLY AS NEEDED., Disp: 30 g, Rfl: 0 .  lisinopril (ZESTRIL) 10 MG tablet, Take 10 mg by mouth once. , Disp: , Rfl:  .  metFORMIN (GLUCOPHAGE-XR) 500 MG 24 hr tablet, TAKE 1 TABLET (500 MG TOTAL) BY MOUTH DAILY WITH BREAKFAST., Disp: 90 tablet, Rfl: 1 .  montelukast (SINGULAIR) 10 MG tablet, Take 1 tablet by mouth on the day before treatment and for 2 days after treatment., Disp: 30 tablet, Rfl: 0 .  Multiple Vitamin (MULTIVITAMIN) tablet, Take 1 tablet by mouth daily., Disp: , Rfl:  .  Multiple Vitamins-Minerals (HAIR/SKIN/NAILS) TABS, Take 1 tablet by mouth daily., Disp: , Rfl:  .  omeprazole (PRILOSEC) 40 MG capsule,  TAKE 1 CAPSULE EVERY DAY, Disp: 90 capsule, Rfl: 3 .  pomalidomide (POMALYST) 3 MG capsule, TAKE 1 CAPSULE BY MOUTH EVERY DAY   Rem's # [id number] Date obtained: 01/05/2020, Disp: 21 capsule, Rfl: 0 .  promethazine (PHENERGAN) 25 MG tablet, Take 1 tablet (25 mg total) by mouth every 6 (six) hours as needed for nausea or vomiting., Disp: 90 tablet, Rfl: 0 .  tiZANidine (ZANAFLEX) 4 MG tablet, Take 1 tablet (4 mg total) by mouth every 8 (eight) hours as needed for muscle spasms., Disp: 270 tablet, Rfl: 1 .  traZODone (DESYREL) 100 MG tablet, TAKE 1 TABLET AT BEDTIME  FOR  SLEEP, Disp: 90 tablet, Rfl: 0 .  diphenoxylate-atropine (LOMOTIL) 2.5-0.025 MG tablet, Take 2 tablets by mouth daily as needed for diarrhea or loose stools. (Patient not taking: Reported on 03/01/2020), Disp: 45 tablet, Rfl: 2 .  furosemide (LASIX) 20 MG tablet, Take 20 mg by mouth as needed.  (Patient not taking: Reported on 03/01/2020), Disp: , Rfl:  .  hydrOXYzine (ATARAX/VISTARIL) 10 MG tablet, TAKE 1 TABLET(10 MG) BY MOUTH THREE TIMES DAILY AS NEEDED (Patient not taking: Reported on 03/01/2020), Disp: 90 tablet, Rfl: 0 .  lactulose (CHRONULAC) 10 GM/15ML solution, , Disp: , Rfl:  .  NARCAN 4 MG/0.1ML LIQD nasal spray kit, 1 spray.  (Patient not taking: Reported on 03/01/2020), Disp: , Rfl:  .  promethazine-dextromethorphan (PROMETHAZINE-DM) 6.25-15 MG/5ML syrup, Take 5 mLs by mouth 4 (four) times daily as needed for cough. (Patient not taking: Reported on 02/21/2020), Disp: 240 mL, Rfl: 0 No current facility-administered medications for this visit.  Facility-Administered Medications Ordered in Other Visits:  .  0.9 %  sodium chloride infusion, , Intravenous, Continuous, Lequita Asal, MD, Stopped at 01/20/20 1232 .  0.9 %  sodium chloride infusion, , Intravenous, Once, Faythe Casa E, NP, Last Rate: 250 mL/hr at 03/01/20 1313, New Bag at 03/01/20 1313 .  heparin lock flush 100 unit/mL, 500 Units, Intravenous, Once, Kelsye Loomer  E, NP .  sodium chloride flush (NS) 0.9 % injection 10 mL, 10 mL, Intravenous, PRN, Mike Gip, Melissa C, MD, 10 mL at 02/07/20 0815 .  sodium chloride flush (NS) 0.9 % injection 10 mL, 10 mL, Intracatheter, PRN, Cammie Sickle, MD, 10 mL at 02/10/20 0815 .  sodium chloride flush (NS) 0.9 % injection 10 mL, 10 mL, Intravenous, PRN, Jacquelin Hawking, NP, 10 mL at 03/01/20 1143  Physical exam:  Vitals:   03/01/20 1200  BP: (!) 133/91  Pulse: 89  Resp: 20  Temp: (!) 100.9 F (38.3 C)  TempSrc: Oral  SpO2: 98%  Weight: 141 lb 6.4 oz (64.1 kg)   Physical Exam Constitutional:      Appearance: Normal appearance.  HENT:     Head: Normocephalic and atraumatic.  Eyes:     Pupils: Pupils are equal, round, and reactive to light.  Cardiovascular:     Rate and Rhythm: Normal rate and regular rhythm.     Heart sounds: Normal heart sounds. No murmur heard.   Pulmonary:     Effort: Pulmonary effort is normal.     Breath sounds: Normal breath sounds. No wheezing.  Abdominal:     General: Bowel sounds are normal. There is no distension.     Palpations: Abdomen is soft.     Tenderness: There is no abdominal tenderness.  Musculoskeletal:        General: Normal range of motion.     Cervical back: Normal range of motion.  Skin:    General: Skin is warm and dry.     Findings: No rash.  Neurological:     Mental Status: She is alert and oriented to person, place, and time.  Psychiatric:        Judgment: Judgment normal.      CMP Latest Ref Rng & Units 03/01/2020  Glucose 70 - 99 mg/dL 110(H)  BUN 8 - 23 mg/dL 14  Creatinine 0.44 - 1.00 mg/dL 1.03(H)  Sodium 135 - 145 mmol/L 139  Potassium 3.5 - 5.1 mmol/L 4.2  Chloride 98 - 111 mmol/L 105  CO2 22 - 32 mmol/L 25  Calcium 8.9 - 10.3 mg/dL 8.3(L)  Total Protein 6.5 - 8.1 g/dL 7.1  Total Bilirubin 0.3 - 1.2 mg/dL 0.2(L)  Alkaline Phos 38 - 126 U/L 85  AST 15 - 41 U/L 26  ALT 0 - 44 U/L 23   CBC Latest Ref Rng & Units 03/01/2020   WBC 4.0 - 10.5 K/uL 2.4(L)  Hemoglobin 12.0 - 15.0 g/dL 10.4(L)  Hematocrit 36 - 46 % 30.5(L)  Platelets 150 - 400 K/uL 150    No images are attached to the encounter.  No results found.   Assessment and plan- Patient is a 64 y.o. female who presents to symptom management for concerns of acute cough and fever x5 days.  On active chemotherapy for multiple myeloma.  Last infusion given on 02/21/2020.  Currently on her 2-week off from Pomalyst.   Neutropenic fever-ANC of 700 today secondary to chemotherapy.  Unclear etiology of fever.  She is febrile with a temp of 100.9.  Will get lab work including blood cultures x2, lactic acid, urinalysis and chest x-ray.  She will likely be admitted to the hospital.  Plan-  Chest x-ray-pending Labs-neutropenia ([place] 700) Urinalysis-positive for UTI. We will go ahead and start on 2 g cefepime.  Lactic acid and blood cultures pending. We will  touch base with hospitalist to see if we can bypass emergency room.  Disposition- Admission to hospital  Addendum-patient taken to the emergency room.  Unable to direct admit due to not having a negative Covid test.  Spoke to emergency room charge nurse Colletta [place] who believes there is a room available for patient while she waits to be seen.  She did receive 2 g cefepime in our clinic along with Phenergan and a liter of IV fluids.   Visit Diagnosis 1. Neutropenic fever ([place])   2. Multiple myeloma not having achieved remission [facility])     Patient expressed understanding and was in agreement with this plan. She also understands that She can call clinic at any time with any questions, concerns, or complaints.   Greater than 50% was spent in counseling and coordination of care with this patient including but not limited to discussion of the relevant topics above (See A&P) including, but not limited to diagnosis and management of acute and chronic medical conditions.   Thank you for allowing me to participate in the  care of this very pleasant patient.    Jacquelin Hawking, NP [place] [facility] Cell - [phone] Pager- [phone] 03/01/2020 2:54 PM

## 2020-03-01 NOTE — ED Provider Notes (Addendum)
[facility] Emergency Department Provider Note  ____________________________________________   First MD Initiated Contact with Patient 03/01/20 2254     (approximate)  I have reviewed the triage vital signs and the nursing notes.   HISTORY  Chief Complaint Fever    HPI Sarah [name] is a 64 y.o. female his medical history includes multiple myeloma for which she is actively being treated with chemotherapy by Dr. Mike Gip.  She presents from the cancer center for further evaluation and admission for neutropenic fever.   She started to feel ill today with a fever and went to the cancer center where she was seen and had labs drawn, blood cultures, urinalysis and urine culture, and chest x-ray.  She received a dose of cefepime IV and was sent to the ED for admission.  Due to overwhelming ED and hospital patient volumes she has been waiting for nearly 8 hours for further evaluation of that she has remained stable.  She reports that she is nauseated and has some mild to moderate burning epigastric pain which she had this morning as well but it is no worse.  She continues to feel febrile.  She has had some chills.  She denies sore throat, vomiting, lower abdominal pain, dysuria, and diarrhea.  She has a little bit of constipation.  Symptoms are acute in onset and severe nothing particular makes them better or worse.        Past Medical History:  Diagnosis Date  . Abnormal stress test 02/14/2016   Overview:  Added automatically from request for surgery [id number]  . Anemia   . Anxiety   . Arthritis   . Bicuspid aortic valve   . CHF (congestive heart failure) ([place])   . CKD (chronic kidney disease) stage 3, GFR 30-59 ml/min   . Depression   . Diabetes mellitus ([place])   . Dizziness   . Fatty liver   . Frequent falls   . GERD (gastroesophageal reflux disease)   . Gout   . Heart murmur   . History of blood transfusion   . History of bone marrow transplant ([place])     . History of uterine fibroid   . Hx of cardiac catheterization 06/05/2016   Overview:  Normal coronaries 2017  . Hypertension   . Hypomagnesemia   . Multiple myeloma ([place])   . Personal history of chemotherapy   . Renal cyst     Patient Active Problem List   Diagnosis Date Noted  . Physical deconditioning 02/07/2020  . Impaired nutrition 02/07/2020  . 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 Acute renal insufficiency 01/24/2020  . Sepsis ([place]) 11/13/2019  . Pneumonia of right lung due to infectious organism 11/13/2019  . Constipation 10/24/2019  . AKI (acute kidney injury) ([place]) 10/15/2019  . Neutropenic fever ([place]) 10/15/2019  . Febrile neutropenia ([place]) 10/15/2019  . Encounter for antineoplastic immunotherapy 10/03/2019  . Hypocalcemia 08/28/2019  . Stage 3 chronic kidney disease 05/12/2019  . Chronic nausea 12/17/2018  . Chemotherapy-induced neuropathy ([place]) 12/17/2018  . Hyperlipidemia associated with type 2 diabetes mellitus ([place]) 11/25/2018  . Normocytic anemia 10/21/2018  . Hypomagnesemia 08/20/2018  . Goals of care, counseling/discussion 08/20/2018  . B12 deficiency 08/20/2018  . Thrombocytopenia ([place]) 02/09/2018  . Benign essential HTN 08/21/2017  . Cardiomyopathy, idiopathic ([place]) 08/21/2017  . Carotid artery stenosis, asymptomatic, bilateral 08/06/2017  . Thyroid nodule 08/06/2017  . Cardiac syncope 07/25/2017  . Iron deficiency anemia due to chronic blood loss 05/13/2017  . Spinal stenosis of lumbar  region with neurogenic claudication 04/09/2017  . 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 LBBB (left bundle branch block) 10/10/2016  . Long term prescription opiate use 09/07/2016  . Chronic pain syndrome 07/08/2016  . Pain medication agreement signed 07/08/2016  . Spondylosis of lumbar region without myelopathy or radiculopathy 07/08/2016  . Chronic diastolic CHF (congestive heart failure), NYHA class 2 ([place]) 06/05/2016  . LVH  (left ventricular hypertrophy) due to hypertensive disease, with heart failure ([place]) 06/05/2016  . Mild aortic valve stenosis 06/05/2016  . 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[place]) 06/05/2016  . Environmental and seasonal allergies 04/19/2016  . Insomnia 04/19/2016  . Bicuspid aortic valve 04/19/2016  . Asthma 04/19/2016  . Aortic regurgitation 04/19/2016  . Type II diabetes mellitus with complication ([place]) 37/03/6268  . Depression, major, single episode, in partial remission ([place]) 04/12/2016  . Irritable bowel syndrome (IBS) 04/12/2016  . Hepatic steatosis 04/12/2016  . Multiple myeloma not having achieved remission ([place]) 04/02/2016  . 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[place]) 07/06/2015  . Stem cell transplant candidate 05/16/2015  . Disequilibrium 01/14/2013    Past Surgical History:  Procedure Laterality Date  . ABDOMINAL HYSTERECTOMY    . 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 CARDIAC ELECTROPHYSIOLOGY MAPPING AND ABLATION    . CARPAL TUNNEL RELEASE Bilateral   . CHOLECYSTECTOMY  2008  . COLONOSCOPY WITH PROPOFOL N/A 05/07/2017   Procedure: COLONOSCOPY WITH PROPOFOL;  Surgeon: [name], MD;  Location: [facility] ENDOSCOPY;  Service: Gastroenterology;  Laterality: N/A;  . ESOPHAGOGASTRODUODENOSCOPY (EGD) WITH PROPOFOL N/A 05/07/2017   Procedure: ESOPHAGOGASTRODUODENOSCOPY (EGD) WITH PROPOFOL;  Surgeon: [name], MD;  Location: [facility] ENDOSCOPY;  Service: Gastroenterology;  Laterality: N/A;  . FOOT SURGERY Bilateral   . INCONTINENCE SURGERY  2009  . INTERSTIM IMPLANT PLACEMENT    . other     over active bladder  . OTHER SURGICAL HISTORY     bladder stimulator   . PARTIAL HYSTERECTOMY  03/1996   fibroids  . PORTA CATH INSERTION N/A 03/10/2019   Procedure: PORTA CATH INSERTION;  Surgeon: Algernon Huxley, MD;  Location: [facility] CV LAB;  Service: Cardiovascular;  Laterality: N/A;  . TONSILLECTOMY  2007    Prior to Admission medications   Medication Sig Start Date End Date Taking?  Authorizing Provider  acetaminophen (TYLENOL) 500 MG tablet Take 500 mg by mouth every 6 (six) hours as needed.    Yes [provider]  acyclovir (ZOVIRAX) 400 MG tablet Take 1 tablet (400 mg total) by mouth 2 (two) times daily. 12/24/19  Yes Lequita Asal, MD  allopurinol (ZYLOPRIM) 100 MG tablet TAKE 1 TABLET(100 MG) BY MOUTH DAILY as needed 12/02/18  Yes Glean Hess, MD  aspirin 81 MG chewable tablet Chew 1 tablet (81 mg total) by mouth daily. 07/27/17  Yes Gladstone Lighter, MD  dexamethasone (DECADRON) 4 MG tablet Take 5 tablets (22m) on days 2, 16 of cycle 3,4, 5 and 6 Take 5 tablets (29m on days 2 of cycles 7 and beyond 02/18/20  Yes Corcoran, Melissa C, MD  diclofenac sodium (VOLTAREN) 1 % GEL Apply 2 g topically 4 (four) times daily. 01/25/19  Yes BeGertie BaronNP  diphenhydrAMINE (BENADRYL) 50 MG capsule Take 50 mg by mouth as needed.    Yes [provider]  fentaNYL (DURAGESIC - DOSED MCG/HR) 25 MCG/HR patch Place 25 mcg onto the skin every 3 (three) days.  08/12/16  Yes [provider]  fexofenadine (ALLEGRA) 180 MG tablet TAKE  1 TABLET(180 MG) BY MOUTH DAILY 05/30/17  Yes Glean Hess, MD  FLUoxetine (PROZAC) 40 MG capsule TAKE 1 CAPSULE EVERY DAY 09/30/19  Yes Glean Hess, MD  fluticasone [facility]) 50 MCG/ACT nasal spray Place 2 sprays into both nostrils daily. 07/10/18  Yes Glean Hess, MD  glucose blood (ONE TOUCH ULTRA TEST) test strip  09/04/15  Yes [provider]  lactulose (CHRONULAC) 10 GM/15ML solution Take 20 g by mouth daily as needed.  10/09/19  Yes [provider]  lidocaine-prilocaine (EMLA) cream APPLY TOPICALLY AS NEEDED. 04/07/19  Yes Corcoran, Drue Second, MD  metFORMIN (GLUCOPHAGE-XR) 500 MG 24 hr tablet TAKE 1 TABLET (500 MG TOTAL) BY MOUTH DAILY WITH BREAKFAST. 01/28/20  Yes Glean Hess, MD  montelukast (SINGULAIR) 10 MG tablet Take 1 tablet by mouth on the day before treatment and for 2 days  after treatment. 12/16/19  Yes Lequita Asal, MD  Multiple Vitamin (MULTIVITAMIN) tablet Take 1 tablet by mouth daily.   Yes [provider]  Multiple Vitamins-Minerals (HAIR/SKIN/NAILS) TABS Take 1 tablet by mouth daily.   Yes [provider]  omeprazole (PRILOSEC) 40 MG capsule TAKE 1 CAPSULE EVERY DAY 11/21/19  Yes Glean Hess, MD  pomalidomide (POMALYST) 3 MG capsule TAKE 1 CAPSULE BY MOUTH EVERY DAY   Rem's # [id number] Date obtained: 01/05/2020 01/14/20  Yes Verlon Au, NP  promethazine (PHENERGAN) 25 MG tablet Take 1 tablet (25 mg total) by mouth every 6 (six) hours as needed for nausea or vomiting. 02/09/20  Yes Corcoran, Drue Second, MD  tiZANidine (ZANAFLEX) 4 MG tablet Take 1 tablet (4 mg total) by mouth every 8 (eight) hours as needed for muscle spasms. 12/02/18  Yes Glean Hess, MD  traZODone (DESYREL) 100 MG tablet TAKE 1 TABLET AT BEDTIME  FOR  SLEEP 01/28/20  Yes Glean Hess, MD  diphenoxylate-atropine (LOMOTIL) 2.5-0.025 MG tablet Take 2 tablets by mouth daily as needed for diarrhea or loose stools. Patient not taking: Reported on 03/01/2020 09/23/19   Lequita Asal, MD  furosemide (LASIX) 20 MG tablet Take 20 mg by mouth as needed.  Patient not taking: Reported on 03/01/2020 12/09/18   [provider]  hydrOXYzine (ATARAX/VISTARIL) 10 MG tablet TAKE 1 TABLET(10 MG) BY MOUTH THREE TIMES DAILY AS NEEDED Patient not taking: Reported on 03/01/2020 05/30/17   Glean Hess, MD  lisinopril (ZESTRIL) 10 MG tablet Take 10 mg by mouth once.  Patient not taking: Reported on 03/01/2020 12/10/19   [provider]  NARCAN 4 MG/0.1ML LIQD nasal spray kit 1 spray.  Patient not taking: Reported on 03/01/2020 04/21/19   [provider]  promethazine-dextromethorphan (PROMETHAZINE-DM) 6.25-15 MG/5ML syrup Take 5 mLs by mouth 4 (four) times daily as needed for cough. Patient not taking: Reported on 02/21/2020 11/18/19   Lequita Asal,  MD    Allergies Oxycodone-acetaminophen, Celebrex [celecoxib], Codeine, Plerixafor, Benadryl [diphenhydramine], Morphine, Ondansetron, and Tylenol [acetaminophen]  Family History  Problem Relation Age of Onset  . Colon cancer Father   . Renal Disease Father   . Diabetes Mellitus II Father   . Melanoma Paternal Grandmother   . Breast cancer Maternal Aunt 62  . Anemia Mother   . Heart disease Mother   . Heart failure Mother   . Renal Disease Mother   . Congestive Heart Failure Mother   . Heart disease Maternal Uncle   . Throat cancer Maternal Uncle   . Lung cancer Maternal Uncle   .  Liver disease Maternal Uncle   . Heart failure Maternal Uncle   . Hearing loss Son 86       Suicide     Social History Social History   Tobacco Use  . Smoking status: Former Smoker    Packs/day: 1.00    Years: 20.00    Pack years: 20.00    Types: Cigarettes    Quit date: 07/02/1991    Years since quitting: 28.6  . Smokeless tobacco: Never Used  Vaping Use  . Vaping Use: Never used  Substance Use Topics  . Alcohol use: Yes  . Drug use: No    Review of Systems Constitutional: +fever/chills Eyes: No visual changes. ENT: No sore throat. Cardiovascular: Denies chest pain. Respiratory: Denies shortness of breath. Gastrointestinal: Burning epigastric pain, mild.  No nausea, no vomiting.  No diarrhea.  No constipation. Genitourinary: Negative for dysuria. Musculoskeletal: Negative for neck pain.  Negative for back pain. Integumentary: Negative for rash. Neurological: Generalized headache, no focal weakness or numbness.   ____________________________________________   PHYSICAL EXAM:  VITAL SIGNS: ED Triage Vitals  Enc Vitals Group     BP 03/01/20 1510 (!) 147/72     Pulse Rate 03/01/20 1510 87     Resp 03/01/20 1510 20     Temp 03/01/20 1510 (!) 101 F (38.3 C)     Temp Source 03/01/20 1510 Oral     SpO2 03/01/20 1510 97 %     Weight 03/01/20 1511 64 kg (141 lb)     Height  03/01/20 1511 1.6 m (5' 3" )     Head Circumference --      Peak Flow --      Pain Score 03/01/20 1511 7     Pain Loc --      Pain Edu? --      Excl. in [place]? --     Constitutional: Alert and oriented.  Eyes: Conjunctivae are normal.  Head: Atraumatic. Nose: No congestion/rhinnorhea. Mouth/Throat: Patient is wearing a mask. Neck: No stridor.  No meningeal signs.   Cardiovascular: Port is present and right chest.  Normal rate, regular rhythm. Good peripheral circulation. Grossly normal heart sounds. Respiratory: Normal respiratory effort.  No retractions.  Occasional cough. Gastrointestinal: Soft and nontender. No distention.  Musculoskeletal: No lower extremity tenderness nor edema. No gross deformities of extremities. Neurologic:  Normal speech and language. No gross focal neurologic deficits are appreciated.  Skin:  Skin is warm, dry and intact. Psychiatric: Mood and affect are normal. Speech and behavior are normal.  ____________________________________________   LABS (all labs ordered are listed, but only abnormal results are displayed)  Labs Reviewed  COMPREHENSIVE METABOLIC PANEL - Abnormal; Notable for the following components:      Result Value   Glucose, Bld 112 (*)    Calcium 8.5 (*)    All other components within normal limits  CBC WITH DIFFERENTIAL/PLATELET - Abnormal; Notable for the following components:   WBC 2.6 (*)    RBC 3.45 (*)    Hemoglobin 10.2 (*)    HCT 31.1 (*)    Neutro Abs 0.8 (*)    All other components within normal limits  SARS CORONAVIRUS 2 BY RT PCR (HOSPITAL ORDER, [facility])  LACTIC ACID, PLASMA  PROCALCITONIN   ____________________________________________  EKG  No indication for emergent EKG tonight ____________________________________________  RADIOLOGY Ursula Alert, personally viewed and evaluated these images (plain radiographs) as part of my medical decision making, as well as reviewing the  written  report by the radiologist.  ED MD interpretation: I personally viewed the x-rays obtained earlier today at the cancer center.  I see no evidence of lobar pneumonia.  Port is visible in the right chest.  Official radiology report(s): DG Chest 2 View  Result Date: 03/01/2020 CLINICAL DATA:  Cough and congestion EXAM: CHEST - 2 VIEW COMPARISON:  11/15/2019, CT 05/21/2016 FINDINGS: Right-sided central venous port tip over the SVC. Chronic bronchitic changes. No focal opacity or pleural effusion. Normal cardiomediastinal silhouette. No pneumothorax. IMPRESSION: No active cardiopulmonary disease. Electronically Signed   By: Donavan Foil M.D.   On: 03/01/2020 23:18    ____________________________________________   PROCEDURES   Procedure(s) performed (including Critical Care):  .Critical Care Performed by: Hinda Kehr, MD Authorized by: Hinda Kehr, MD   Critical care provider statement:    Critical care time (minutes):  30   Critical care time was exclusive of:  Separately billable procedures and treating other patients   Critical care was necessary to treat or prevent imminent or life-threatening deterioration of the following conditions:  Sepsis (neutropenic fever)   Critical care was time spent personally by me on the following activities:  Development of treatment plan with patient or surrogate, discussions with consultants, evaluation of patient's response to treatment, examination of patient, obtaining history from patient or surrogate, ordering and performing treatments and interventions, ordering and review of laboratory studies, ordering and review of radiographic studies, pulse oximetry, re-evaluation of patient's condition and review of old charts     ____________________________________________   [place] / MDM / [place] / ED COURSE  As part of my medical decision making, I reviewed the following data within the [place]  notes reviewed and incorporated, Labs reviewed , Old EKG reviewed, Old chart reviewed, Radiograph reviewed , Discussed with admitting physician (Dr. Tonie Griffith) and Notes from prior ED visits   Differential diagnosis includes, but is not limited to, neutropenic fever, bacteremia, urinary tract infection, pneumonia.  Patient is fully vaccinated for COVID-19 although she has a cough.  COVID-19 PCR test is pending.  Labs obtained at the cancer center about 12 hours ago demonstrate moderate neutropenia.  Comprehensive metabolic panel is generally reassuring with no acute abnormalities.  Lactic acid is normal.  Urinalysis is nitrite positive with many bacteria seen and elevated urine WBC count.  A urine culture is pending and the patient has already received cefepime IV, presumably 2 g.  Patient reports persistent severe nausea and I have ordered droperidol 1.25 mg IV to help with the refractory nausea (she has extensive drug allergies including to Zofran, and the droperidol should not only be very effective but should be safer than IV administration of Phenergan).  I have also ordered ibuprofen 400 mg p.o. for her headache.  I will contact the hospitalist service for admission.  Given that she meets criteria for sepsis, I initiated the sepsis protocol with empiric antibiotics of cefepime 2 g IV and vancomycin 1 g IV and metronidazole 500 mg IV as per protocol (note that it has been 12 hours since her last administration of cefepime).  I also ordered 30 mL/kg of lactated Ringer's.  No indication for imaging; the patient has only minimal tenderness to palpation of the epigastrium without localized peritonitis and her symptoms seem most consistent with a gastritis or acid reflux rather than an acute abdomen.       Clinical Course as of Mar 01 2358  Wed Mar 01, 2020  2343  Discussed case with Dr. Tonie Griffith with the hospitalist service.  He will admit.   [CF]  9276 Stable metabolic panel from earlier today.    Comprehensive metabolic panel(!) [CF]    Clinical Course User Index [CF] Hinda Kehr, MD     ____________________________________________  FINAL CLINICAL IMPRESSION(S) / ED DIAGNOSES  Final diagnoses:  Sepsis, due to unspecified organism, unspecified whether acute organ dysfunction present ([place])  Neutropenic fever ([place])  Urinary tract infection without hematuria, site unspecified  Immunocompromised state due to drug therapy     MEDICATIONS GIVEN DURING THIS VISIT:  Medications  lactated ringers infusion (has no administration in time range)  lactated ringers bolus 1,000 mL (1,000 mLs Intravenous New Bag/Given 03/01/20 2358)    And  lactated ringers bolus 1,000 mL (has no administration in time range)  ceFEPIme (MAXIPIME) 2 g in sodium chloride 0.9 % 100 mL IVPB (2 g Intravenous New Bag/Given 03/01/20 2357)  metroNIDAZOLE (FLAGYL) IVPB 500 mg (has no administration in time range)  vancomycin (VANCOCIN) IVPB 1000 mg/200 mL premix (has no administration in time range)  ibuprofen (ADVIL) tablet 400 mg (has no administration in time range)  droperidol (INAPSINE) 2.5 MG/ML injection 1.25 mg (has no administration in time range)     ED Discharge Orders    None      *Please note:  Joey Hudock was evaluated in Emergency Department on 03/01/2020 for the symptoms described in the history of present illness. She was evaluated in the context of the global COVID-19 pandemic, which necessitated consideration that the patient might be at risk for infection with the SARS-CoV-2 virus that causes COVID-19. Institutional protocols and algorithms that pertain to the evaluation of patients at risk for COVID-19 are in a state of rapid change based on information released by regulatory bodies including the CDC and federal and state organizations. These policies and algorithms were followed during the patient's care in the ED.  Some ED evaluations and interventions may be delayed as a result of  limited staffing during and after the pandemic.*  Note:  This document was prepared using Dragon voice recognition software and may include unintentional dictation errors.   Hinda Kehr, MD 03/01/20 2351    Hinda Kehr, MD 03/01/20 2359

## 2020-03-01 NOTE — ED Triage Notes (Signed)
Pt to triage via wheelchair.  Pt sent from the cancer center for eval of fever, weakness.  Pt has a hiatal hernia also and states area is hurting also.  Pt has a port.  Hx multiple myeloma    Pt alert speech clear.

## 2020-03-01 NOTE — Telephone Encounter (Signed)
Patient called stating that "I have picked up something and I don't know what, I was fine Friday. It just started creeping up on me running low grade fever not over 100,, coughing up yellow stuff" Asking what Dr Mike Gip wants her to do. Please advise

## 2020-03-01 NOTE — Progress Notes (Signed)
Pt having fever, cough, yellow sputum. She is having epigastric burning. Neutropenic fever. Obtained urine culture, blood cultures, lactic acid and CXR this afternoon. Port accessed. Saline locked in R chest. Report given to Vicente Males in the ED as pt needs to be admitted. She received 1st dose of antibiotic cefipime in Cancer center. Transferred pt via W/C to the ED. Nurse told me they would keep pt separate from other pt's in a family room by herself.

## 2020-03-01 NOTE — Consult Note (Signed)
PHARMACY -  BRIEF ANTIBIOTIC NOTE   Pharmacy has received consult(s) for cefepime and vancomycin from an ED provider. Patient is also ordered metronidazole. The patient's profile has been reviewed for ht/wt/allergies/indication/available labs.    Patient presenting to the ED with neutropenic fever from oncology clinic. Patient received cefepime 2 g IV x 1 at 1406 today.   One time order(s) placed for --Vancomycin 1 g (already ordered) --Cefepime 2 g (already ordered)  Further antibiotics/pharmacy consults should be ordered by admitting physician if indicated.                       Thank you, Benita Gutter 03/01/2020  11:06 PM

## 2020-03-01 NOTE — ED Notes (Addendum)
Due to labs being performed at cancer center initial 1600 labs were not drawn. Repeat labs drawn at this time per MD order. MD at bedside. Repeat lactic will be due 2hrs from this time.

## 2020-03-01 NOTE — Telephone Encounter (Signed)
Spoke with the patient to see what i could do to help with her health status. The patient states she has been coughing with congestion, spitting up yellow sputum started Friday. The patient explained to me that she spoke with Ms Sharyn Lull in [facility] and she was informed to go to the medical mall for a chest x ray and then come over to [facility] to be seen. The patient expressed that she didn't want to go to [place], but I explained to her that Ms Sharyn Lull is in the [facility] and she will be fine to go over to see [facility] and get care. The patient was understanding and agreeable to go to [place] for care today.

## 2020-03-01 NOTE — ED Notes (Signed)
Pt given phone to talk to sister 

## 2020-03-01 NOTE — Telephone Encounter (Signed)
Patient scheduled for [facility]om Management Clinic visit today

## 2020-03-01 NOTE — Progress Notes (Signed)
Pt had GI upset after eating at [employer] Saturday she had a piece of steak get lodged, now esophagus is really sore. She felt really nauseated after that meal Has a large hiatal hernia and her stomach hurt all weekend. Monday night started having cough, nasal congestion. Low grade fever started yesterday. Fever 100.9 in clinic. Had CXR prior to coming into clinic. Last Darzalex inj was 8/16. She is off pomalyst for 2 weeks.

## 2020-03-02 ENCOUNTER — Other Ambulatory Visit: Payer: Self-pay

## 2020-03-02 ENCOUNTER — Inpatient Hospital Stay: Payer: Medicare PPO

## 2020-03-02 ENCOUNTER — Encounter: Payer: Self-pay | Admitting: Family Medicine

## 2020-03-02 ENCOUNTER — Emergency Department: Payer: Medicare PPO

## 2020-03-02 DIAGNOSIS — R1013 Epigastric pain: Secondary | ICD-10-CM | POA: Diagnosis present

## 2020-03-02 DIAGNOSIS — Z800 Family history of malignant neoplasm of digestive organs: Secondary | ICD-10-CM | POA: Diagnosis not present

## 2020-03-02 DIAGNOSIS — Z9484 Stem cells transplant status: Secondary | ICD-10-CM | POA: Diagnosis not present

## 2020-03-02 DIAGNOSIS — T451X5A Adverse effect of antineoplastic and immunosuppressive drugs, initial encounter: Secondary | ICD-10-CM | POA: Diagnosis present

## 2020-03-02 DIAGNOSIS — I10 Essential (primary) hypertension: Secondary | ICD-10-CM | POA: Diagnosis present

## 2020-03-02 DIAGNOSIS — K5900 Constipation, unspecified: Secondary | ICD-10-CM | POA: Diagnosis present

## 2020-03-02 DIAGNOSIS — F324 Major depressive disorder, single episode, in partial remission: Secondary | ICD-10-CM | POA: Diagnosis present

## 2020-03-02 DIAGNOSIS — K760 Fatty (change of) liver, not elsewhere classified: Secondary | ICD-10-CM | POA: Diagnosis present

## 2020-03-02 DIAGNOSIS — C9000 Multiple myeloma not having achieved remission: Secondary | ICD-10-CM | POA: Diagnosis present

## 2020-03-02 DIAGNOSIS — Z8249 Family history of ischemic heart disease and other diseases of the circulatory system: Secondary | ICD-10-CM | POA: Diagnosis not present

## 2020-03-02 DIAGNOSIS — R32 Unspecified urinary incontinence: Secondary | ICD-10-CM | POA: Diagnosis present

## 2020-03-02 DIAGNOSIS — N390 Urinary tract infection, site not specified: Secondary | ICD-10-CM | POA: Diagnosis present

## 2020-03-02 DIAGNOSIS — Z20822 Contact with and (suspected) exposure to covid-19: Secondary | ICD-10-CM | POA: Diagnosis present

## 2020-03-02 DIAGNOSIS — D701 Agranulocytosis secondary to cancer chemotherapy: Secondary | ICD-10-CM | POA: Diagnosis present

## 2020-03-02 DIAGNOSIS — R0600 Dyspnea, unspecified: Secondary | ICD-10-CM

## 2020-03-02 DIAGNOSIS — D61810 Antineoplastic chemotherapy induced pancytopenia: Secondary | ICD-10-CM | POA: Diagnosis present

## 2020-03-02 DIAGNOSIS — Z833 Family history of diabetes mellitus: Secondary | ICD-10-CM | POA: Diagnosis not present

## 2020-03-02 DIAGNOSIS — Q231 Congenital insufficiency of aortic valve: Secondary | ICD-10-CM | POA: Diagnosis not present

## 2020-03-02 DIAGNOSIS — D709 Neutropenia, unspecified: Secondary | ICD-10-CM | POA: Diagnosis present

## 2020-03-02 DIAGNOSIS — E119 Type 2 diabetes mellitus without complications: Secondary | ICD-10-CM | POA: Diagnosis present

## 2020-03-02 DIAGNOSIS — Z9481 Bone marrow transplant status: Secondary | ICD-10-CM | POA: Diagnosis not present

## 2020-03-02 DIAGNOSIS — E440 Moderate protein-calorie malnutrition: Secondary | ICD-10-CM | POA: Diagnosis present

## 2020-03-02 DIAGNOSIS — R5081 Fever presenting with conditions classified elsewhere: Secondary | ICD-10-CM | POA: Diagnosis present

## 2020-03-02 DIAGNOSIS — D84821 Immunodeficiency due to drugs: Secondary | ICD-10-CM | POA: Diagnosis present

## 2020-03-02 LAB — BASIC METABOLIC PANEL
Anion gap: 4 — ABNORMAL LOW (ref 5–15)
BUN: 10 mg/dL (ref 8–23)
CO2: 27 mmol/L (ref 22–32)
Calcium: 8 mg/dL — ABNORMAL LOW (ref 8.9–10.3)
Chloride: 108 mmol/L (ref 98–111)
Creatinine, Ser: 0.82 mg/dL (ref 0.44–1.00)
GFR calc Af Amer: >60 mL/min (ref >60)
GFR calc non Af Amer: >60 mL/min (ref >60)
Glucose, Bld: 103 mg/dL — ABNORMAL HIGH (ref 70–99)
Potassium: 4.1 mmol/L (ref 3.5–5.1)
Sodium: 139 mmol/L (ref 135–145)

## 2020-03-02 LAB — HEMOGLOBIN A1C
Hgb A1c MFr Bld: 5.9 % — ABNORMAL HIGH (ref 4.8–5.6)
Mean Plasma Glucose: 122.63 mg/dL

## 2020-03-02 LAB — LACTIC ACID, PLASMA: Lactic Acid, Venous: 1.1 mmol/L (ref 0.5–1.9)

## 2020-03-02 LAB — GLUCOSE, CAPILLARY
Glucose-Capillary: 93 mg/dL (ref 70–99)
Glucose-Capillary: 122 mg/dL — ABNORMAL HIGH (ref 70–99)
Glucose-Capillary: 104 mg/dL — ABNORMAL HIGH (ref 70–99)
Glucose-Capillary: 136 mg/dL — ABNORMAL HIGH (ref 70–99)

## 2020-03-02 LAB — PROCALCITONIN: Procalcitonin: <0.1 ng/mL

## 2020-03-02 LAB — CBC WITH DIFFERENTIAL/PLATELET
Abs Immature Granulocytes: 0.01 10*3/uL (ref 0.00–0.07)
Basophils Absolute: 0 10*3/uL (ref 0.0–0.1)
Basophils Relative: 2 %
Eosinophils Absolute: 0.1 10*3/uL (ref 0.0–0.5)
Eosinophils Relative: 6 %
HCT: 28.8 % — ABNORMAL LOW (ref 36.0–46.0)
Hemoglobin: 9.6 g/dL — ABNORMAL LOW (ref 12.0–15.0)
Immature Granulocytes: 1 %
Lymphocytes Relative: 32 %
Lymphs Abs: 0.6 10*3/uL — ABNORMAL LOW (ref 0.7–4.0)
MCH: 30 pg (ref 26.0–34.0)
MCHC: 33.3 g/dL (ref 30.0–36.0)
MCV: 90 fL (ref 80.0–100.0)
Monocytes Absolute: 0.7 10*3/uL (ref 0.1–1.0)
Monocytes Relative: 34 %
Neutro Abs: 0.5 10*3/uL — ABNORMAL LOW (ref 1.7–7.7)
Neutrophils Relative %: 25 %
Platelets: 138 10*3/uL — ABNORMAL LOW (ref 150–400)
RBC: 3.2 MIL/uL — ABNORMAL LOW (ref 3.87–5.11)
RDW: 13.2 % (ref 11.5–15.5)
Smear Review: NORMAL
WBC: 1.9 10*3/uL — ABNORMAL LOW (ref 4.0–10.5)
nRBC: 0 % (ref 0.0–0.2)

## 2020-03-02 LAB — SARS CORONAVIRUS 2 BY RT PCR (HOSPITAL ORDER, PERFORMED IN ~~LOC~~ HOSPITAL LAB): SARS Coronavirus 2: NEGATIVE

## 2020-03-02 LAB — MAGNESIUM: Magnesium: 1.4 mg/dL — ABNORMAL LOW (ref 1.7–2.4)

## 2020-03-02 MED ORDER — SODIUM CHLORIDE 0.9 % IV SOLN
INTRAVENOUS | Status: DC | PRN
  Administered 2020-03-02 – 2020-03-03 (×2): 250 mL via INTRAVENOUS

## 2020-03-02 MED ORDER — FLUOXETINE HCL 20 MG PO CAPS
40.0000 mg | ORAL_CAPSULE | Freq: Every day | ORAL | Status: DC
  Administered 2020-03-02 – 2020-03-05 (×4): 40 mg via ORAL
  Filled 2020-03-02 (×4): qty 2

## 2020-03-02 MED ORDER — METRONIDAZOLE IN NACL 5-0.79 MG/ML-% IV SOLN
500.0000 mg | Freq: Three times a day (TID) | INTRAVENOUS | Status: DC
  Administered 2020-03-02 – 2020-03-03 (×3): 500 mg via INTRAVENOUS
  Filled 2020-03-02 (×7): qty 100

## 2020-03-02 MED ORDER — SODIUM CHLORIDE 0.9 % IV SOLN
2.0000 g | Freq: Three times a day (TID) | INTRAVENOUS | Status: DC
  Administered 2020-03-02 – 2020-03-03 (×4): 2 g via INTRAVENOUS
  Filled 2020-03-02 (×6): qty 2

## 2020-03-02 MED ORDER — SENNOSIDES-DOCUSATE SODIUM 8.6-50 MG PO TABS: 1.0000 | ORAL_TABLET | Freq: Every evening | ORAL | Status: DC | PRN

## 2020-03-02 MED ORDER — ASPIRIN 81 MG PO CHEW
81.0000 mg | CHEWABLE_TABLET | Freq: Every day | ORAL | Status: DC
  Administered 2020-03-02 – 2020-03-05 (×3): 81 mg via ORAL
  Filled 2020-03-02 (×4): qty 1

## 2020-03-02 MED ORDER — PROMETHAZINE HCL 25 MG PO TABS
12.5000 mg | ORAL_TABLET | Freq: Four times a day (QID) | ORAL | Status: DC | PRN
  Filled 2020-03-02 (×2): qty 1

## 2020-03-02 MED ORDER — LACTATED RINGERS IV SOLN: INTRAVENOUS | Status: DC

## 2020-03-02 MED ORDER — IBUPROFEN 400 MG PO TABS
400.0000 mg | ORAL_TABLET | Freq: Four times a day (QID) | ORAL | Status: DC | PRN
  Administered 2020-03-02 – 2020-03-03 (×3): 400 mg via ORAL
  Filled 2020-03-02 (×3): qty 1

## 2020-03-02 MED ORDER — VANCOMYCIN HCL 750 MG/150ML IV SOLN
750.0000 mg | Freq: Two times a day (BID) | INTRAVENOUS | Status: DC
  Administered 2020-03-02 – 2020-03-03 (×2): 750 mg via INTRAVENOUS
  Filled 2020-03-02 (×4): qty 150

## 2020-03-02 MED ORDER — ENOXAPARIN SODIUM 40 MG/0.4ML ~~LOC~~ SOLN
40.0000 mg | SUBCUTANEOUS | Status: DC
  Administered 2020-03-02 – 2020-03-05 (×4): 40 mg via SUBCUTANEOUS
  Filled 2020-03-02 (×4): qty 0.4

## 2020-03-02 MED ORDER — ADULT MULTIVITAMIN W/MINERALS CH
1.0000 | ORAL_TABLET | Freq: Every day | ORAL | Status: DC
  Administered 2020-03-02 – 2020-03-05 (×4): 1 via ORAL
  Filled 2020-03-02 (×4): qty 1

## 2020-03-02 MED ORDER — SUCRALFATE 1 GM/10ML PO SUSP
1.0000 g | Freq: Two times a day (BID) | ORAL | Status: DC
  Administered 2020-03-02 – 2020-03-05 (×7): 1 g via ORAL
  Filled 2020-03-02 (×9): qty 10

## 2020-03-02 MED ORDER — INSULIN ASPART 100 UNIT/ML ~~LOC~~ SOLN: 0.0000 [IU] | Freq: Every day | SUBCUTANEOUS | Status: DC

## 2020-03-02 MED ORDER — FENTANYL 25 MCG/HR TD PT72
1.0000 | MEDICATED_PATCH | TRANSDERMAL | Status: DC
  Administered 2020-03-02 – 2020-03-05 (×2): 1 via TRANSDERMAL
  Filled 2020-03-02 (×2): qty 1

## 2020-03-02 MED ORDER — GUAIFENESIN-DM 100-10 MG/5ML PO SYRP
10.0000 mL | ORAL_SOLUTION | Freq: Three times a day (TID) | ORAL | Status: DC
  Administered 2020-03-02 – 2020-03-03 (×3): 10 mL via ORAL
  Filled 2020-03-02 (×3): qty 10

## 2020-03-02 MED ORDER — ALLOPURINOL 100 MG PO TABS
100.0000 mg | ORAL_TABLET | Freq: Every day | ORAL | Status: DC
  Administered 2020-03-02 – 2020-03-05 (×4): 100 mg via ORAL
  Filled 2020-03-02 (×4): qty 1

## 2020-03-02 MED ORDER — ENSURE MAX PROTEIN PO LIQD
11.0000 [oz_av] | Freq: Two times a day (BID) | ORAL | Status: DC
  Administered 2020-03-03 – 2020-03-04 (×2): 11 [oz_av] via ORAL
  Filled 2020-03-02: qty 330

## 2020-03-02 MED ORDER — INSULIN ASPART 100 UNIT/ML ~~LOC~~ SOLN
0.0000 [IU] | Freq: Three times a day (TID) | SUBCUTANEOUS | Status: DC
  Filled 2020-03-02: qty 1

## 2020-03-02 MED ORDER — TRAZODONE HCL 50 MG PO TABS: 50.0000 mg | ORAL_TABLET | Freq: Every evening | ORAL | Status: DC | PRN

## 2020-03-02 MED ORDER — ACYCLOVIR 200 MG PO CAPS
400.0000 mg | ORAL_CAPSULE | Freq: Two times a day (BID) | ORAL | Status: DC
  Administered 2020-03-02 – 2020-03-05 (×6): 400 mg via ORAL
  Filled 2020-03-02 (×8): qty 2

## 2020-03-02 MED ORDER — MAGNESIUM SULFATE 2 GM/50ML IV SOLN
2.0000 g | Freq: Once | INTRAVENOUS | Status: AC
  Administered 2020-03-02: 2 g via INTRAVENOUS
  Filled 2020-03-02: qty 50

## 2020-03-02 MED ORDER — IOHEXOL 300 MG/ML  SOLN
75.0000 mL | Freq: Once | INTRAMUSCULAR | Status: AC | PRN
  Administered 2020-03-02: 75 mL via INTRAVENOUS

## 2020-03-02 MED ORDER — PROMETHAZINE HCL 25 MG PO TABS
25.0000 mg | ORAL_TABLET | Freq: Four times a day (QID) | ORAL | Status: DC | PRN
  Administered 2020-03-02 – 2020-03-05 (×10): 25 mg via ORAL
  Filled 2020-03-02 (×11): qty 1

## 2020-03-02 NOTE — Progress Notes (Signed)
Initial Nutrition Assessment  DOCUMENTATION CODES:   Non-severe (moderate) malnutrition in context of chronic illness  INTERVENTION:  Plan is to liberalize diet to carbohydrate modified.  Provide Ensure max Protein po BID, each supplement provides 150 kcal and 30 grams of protein. Patient prefers vanilla or strawberry.  Encouraged adequate intake of calories and protein at meals and snacks.  NUTRITION DIAGNOSIS:   Moderate Malnutrition related to chronic illness (multiple myeloma) as evidenced by moderate fat depletion, mild muscle depletion, moderate muscle depletion.  GOAL:   Patient will meet greater than or equal to 90% of their needs  MONITOR:   PO intake, Supplement acceptance, Labs, Weight trends, I & O's  REASON FOR ASSESSMENT:   Malnutrition Screening Tool    ASSESSMENT:   64 year old female with PMHx of DM, anxiety, depression, GERD, gout, arthritis, CHF, HTN, multiple myeloma s/p autologous stem-cell transplant in 2016 in [place] on chemotherapy admitted with neutropenic fever.   Met with patient at bedside. Patient reports at home she has a fairly good appetite. She lives with her two sisters. She typically eats 2 meals per day and then snacks some between meals. For breakfast she may have toast or cereal with eggs. Dinner is usually some type of protein/meat with potatoes and greens. She also drinks high-protein oral nutrition shakes at home. Patient amenable to drinking Ensure Max Protein here as they are most similar to the drinks she has at home. She prefers vanilla or strawberry. She reports today she is experiencing nausea and headache.   Patient reports she weighed around 150 lbs at the beginning of the year and then started losing weight with chemotherapy. Per review of chart patient was 151.8 lbs on 07/15/2019 and was 64 kg on 01/10/2020. She is currently documented to be 64 kg (141 lbs). She lost 10.8 kg (7.1% body weight) over 6 months, which is not  significant for time frame.  Medications reviewed and include: acyclovir, allopurinol, fentanyl patch, Novolog 0-15 units TID, Novolog 0-5 units QHS, MVI daily, Carafate 1 gram BID, cefepime, LR at 50 mL/hr, Flagyl, vancomycin.  Labs reviewed.  Discussed with MD in secure chat. Okay to liberalize diet to carbohydrate modified.  NUTRITION - FOCUSED PHYSICAL EXAM:    Most Recent Value  Orbital Region Moderate depletion  Upper Arm Region Moderate depletion  Thoracic and Lumbar Region Mild depletion  Buccal Region Moderate depletion  Temple Region Moderate depletion  Clavicle Bone Region Moderate depletion  Clavicle and Acromion Bone Region Moderate depletion  Scapular Bone Region Mild depletion  Dorsal Hand Moderate depletion  Patellar Region Moderate depletion  Anterior Thigh Region Moderate depletion  Posterior Calf Region Mild depletion  Edema (RD Assessment) None  Hair Reviewed  Eyes Reviewed  Mouth Reviewed  Skin Reviewed  Nails Reviewed     Diet Order:   Diet Order            Diet heart healthy/carb modified Room service appropriate? Yes; Fluid consistency: Thin  Diet effective now                EDUCATION NEEDS:   No education needs have been identified at this time  Skin:  Skin Assessment: Reviewed RN Assessment  Last BM:  03/01/2020 per chart  Height:   Ht Readings from Last 1 Encounters:  03/01/20 5' 3"  (1.6 m)   Weight:   Wt Readings from Last 1 Encounters:  03/01/20 64 kg   BMI:  Body mass index is 24.98 kg/m.  Estimated Nutritional  Needs:   Kcal:  1600-1800  Protein:  80-90 grams  Fluid:  1.6-1.8 L/day  Jacklynn Barnacle, MS, RD, LDN Pager number available on Amion

## 2020-03-02 NOTE — Progress Notes (Signed)
PROGRESS NOTE    Patient: Sarah [name]                            PCP: Glean Hess, MD                    DOB: Nov 20, 1955            DOA: 03/01/2020 PHX:[id number]             DOS: 03/02/2020, 7:25 AM   LOS: 0 days   Date of Service: The patient was seen and examined on 03/02/2020  Subjective:   The patient was seen and examined this Am. Hemodynamically stable, afebrile normotensive.. No issues overnight.  Brief Narrative:   Sarah [name] is a 64 y.o. female w PMH/o Multiple Myeloma for which she is actively being treated with chemotherapy by Dr. Mike Gip. She presents from the cancer center for further evaluation and admission for neutropenic fever. She had labs drawn, blood cultures, urinalysis and urine culture, and chest x-ray. She received a dose of cefepime IV and was sent to the ED for admission.     She was vaccinated for COVID-19 (4/21)  ED Course:  Tmax 102.1 WBC 2.6 given ibuprofen.  Chest x-ray was negative for infiltrate.    Abnormal urine in office, she continues to have a cough.  She was given empiric antibiotics with cefepime and vancomycin for neutropenic fever... Cultures were obtained.  SARS-CoV-2 negative  Assessment & Plan:   Principal Problem:   Neutropenic fever ([place]) Active Problems:   Multiple myeloma not having achieved remission (HCC)   Type II diabetes mellitus with complication (HCC)   Dyspnea   Neutropenic fever ([place]) Sarah [name] Sarah [name] out sepsis-bacteremia -Ruling out sepsis/bacteremia-in a immunocompromised patient (patient did not meet sepsis criteria on admission) -Hemodynamically stable this a.m. In a setting of multiple myeloma and is undergoing chemotherapy with oncology at this time. Last chemotherapy was 2 weeks ago. -We will follow up with cultures -Continue currently initiated antibiotics of cefepime and vancomycin  -Appreciate oncology assistance  Patient has nitrites on urinalysis but she denies any urinary symptoms  of frequency, hesitancy or dysuria.  She does have a bladder stimulator secondary to previous incontinence she states. -UA positive for leukocyte esterase, nitrites, many WBCs  -SARS-CoV-2 negative  Active Problems:   Multiple myeloma not having achieved remission (HCC) -Management per oncology- -Appreciate follow-up and input -Last chemotherapy 2 weeks ago    Type II diabetes mellitus with complication (HCC) -Blood sugar stable -A1c 5.9, -Check in CBG QA CHS, SSI coverage -We will holding home medication of Metformin    Mild  Dyspnea -without respiratory failure-persistent cough -CT of chest was reviewed-negative for intrathoracic acute changes -We will continue to monitor closely, O2 supplement as needed, nebs as needed  Mild epigastric pain -with cough -Likely persisting cough We will continue mucolytic's, cough suppressants   Nutritional status:            Cultures; Blood Cultures x 2 >> NGT Urine Culture  >>> NGT  Sputum Culture >> NGT   Antimicrobials:     Consultants: Oncology    ---------------------------------------------------------------------------------------------------------------------------------------------  DVT prophylaxis:  SCD/Compression stockings and Lovenox SQ Code Status:   Code Status: Full Code Family Communication: No family member present at bedside- attempt will be made to update daily The above findings and plan of care has been discussed with patient (and family )  in detail,  they expressed understanding  and agreement of above. -Advance care planning has been discussed.   Admission status:    Status is: Inpatient  Remains inpatient appropriate because:Inpatient level of care appropriate due to severity of illness   Dispo: The patient is from: Home              Anticipated d/c is to: Home              Anticipated d/c date is: 3 days              Patient currently is not medically stable to  d/c.        Procedures:   No admission procedures for hospital encounter.     Antimicrobials:  Anti-infectives (From admission, onward)   Start     Dose/Rate Route Frequency Ordered Stop   03/01/20 2315  ceFEPIme (MAXIPIME) 2 g in sodium chloride 0.9 % 100 mL IVPB        2 g 200 mL/hr over 30 Minutes Intravenous  Once 03/01/20 2303 03/02/20 0052   03/01/20 2315  metroNIDAZOLE (FLAGYL) IVPB 500 mg        500 mg 100 mL/hr over 60 Minutes Intravenous  Once 03/01/20 2303 03/02/20 0202   03/01/20 2315  vancomycin (VANCOCIN) IVPB 1000 mg/200 mL premix        1,000 mg 200 mL/hr over 60 Minutes Intravenous  Once 03/01/20 2303 03/02/20 0309       Medication:  . aspirin  81 mg Oral Daily  . enoxaparin (LOVENOX) injection  40 mg Subcutaneous Q24H  . fentaNYL  1 patch Transdermal Q72H  . insulin aspart  0-15 Units Subcutaneous TID WC  . insulin aspart  0-5 Units Subcutaneous QHS    ibuprofen, promethazine, senna-docusate   Objective:   Vitals:   03/02/20 0230 03/02/20 0300 03/02/20 0316 03/02/20 0357  BP: 103/69   (!) 156/69  Pulse: 76 82  79  Resp:    16  Temp:   98.8 F (37.1 C) 98.5 F (36.9 C)  TempSrc:   Oral Oral  SpO2: 95% 96%  98%  Weight:      Height:        Intake/Output Summary (Last 24 hours) at 03/02/2020 0725 Last data filed at 03/02/2020 0413 Gross per 24 hour  Intake 2895.14 ml  Output --  Net 2895.14 ml   Filed Weights   03/01/20 1511  Weight: 64 kg     Examination:   Physical Exam  Constitution:  Alert, cooperative, no distress,  Appears calm and comfortable  Psychiatric: Normal and stable mood and affect, cognition intact,   HEENT: Normocephalic, PERRL, otherwise with in Normal limits  Chest:Chest symmetric Cardio vascular:  S1/S2, RRR, No murmure, No Rubs or Gallops  pulmonary: Clear to auscultation bilaterally, respirations unlabored, negative wheezes / crackles Abdomen: Soft, non-tender, non-distended, bowel sounds,no masses, no  organomegaly Muscular skeletal: Limited exam - in bed, able to move all 4 extremities, Normal strength,  Neuro: CNII-XII intact. , normal motor and sensation, reflexes intact  Extremities: No pitting edema lower extremities, +2 pulses  Skin: Dry, warm to touch, negative for any Rashes, No open wounds Wounds: per nursing documentation    ------------------------------------------------------------------------------------------------------------------------------------------    LABs:  CBC Latest Ref Rng & Units 03/01/2020 03/01/2020 02/28/2020  WBC 4.0 - 10.5 K/uL 2.6(L) 2.4(L) 3.3(L)  Hemoglobin 12.0 - 15.0 g/dL 10.2(L) 10.4(L) 10.6(L)  Hematocrit 36 - 46 % 31.1(L) 30.5(L) 31.9(L)  Platelets 150 - 400 K/uL 157 150 122(L)   CMP  Latest Ref Rng & Units 03/02/2020 03/01/2020 03/01/2020  Glucose 70 - 99 mg/dL 103(H) 112(H) 110(H)  BUN 8 - 23 mg/dL 10 12 14   Creatinine 0.44 - 1.00 mg/dL 0.82 0.88 1.03(H)  Sodium 135 - 145 mmol/L 139 137 139  Potassium 3.5 - 5.1 mmol/L 4.1 4.3 4.2  Chloride 98 - 111 mmol/L 108 105 105  CO2 22 - 32 mmol/L 27 23 25   Calcium 8.9 - 10.3 mg/dL 8.0(L) 8.5(L) 8.3(L)  Total Protein 6.5 - 8.1 g/dL - 6.8 7.1  Total Bilirubin 0.3 - 1.2 mg/dL - 0.4 0.2(L)  Alkaline Phos 38 - 126 U/L - 74 85  AST 15 - 41 U/L - 25 26  ALT 0 - 44 U/L - 23 23       Micro Results Recent Results (from the past 240 hour(s))  SARS Coronavirus 2 by RT PCR (hospital order, performed in [facility] hospital lab) Nasopharyngeal Nasopharyngeal Swab     Status: None   Collection Time: 03/01/20 11:33 PM   Specimen: Nasopharyngeal Swab  Result Value Ref Range Status   SARS Coronavirus 2 NEGATIVE NEGATIVE Final    Comment: (NOTE) SARS-CoV-2 target nucleic acids are NOT DETECTED.  The SARS-CoV-2 RNA is generally detectable in upper and lower respiratory specimens during the acute phase of infection. The lowest concentration of SARS-CoV-2 viral copies this assay can detect is 250 copies / mL. A  negative result does not preclude SARS-CoV-2 infection and should not be used as the sole basis for treatment or other patient management decisions.  A negative result may occur with improper specimen collection / handling, submission of specimen other than nasopharyngeal swab, presence of viral mutation(s) within the areas targeted by this assay, and inadequate number of viral copies (<250 copies / mL). A negative result must be combined with clinical observations, patient history, and epidemiological information.  Fact Sheet for Patients:   StrictlyIdeas.no  Fact Sheet for Healthcare Providers: BankingDealers.co.za  This test is not yet approved or  cleared by the [place] FDA and has been authorized for detection and/or diagnosis of SARS-CoV-2 by FDA under an Emergency Use Authorization (EUA).  This EUA will remain in effect (meaning this test can be used) for the duration of the COVID-19 declaration under Section 564(b)(1) of the Act, 21 U.S.C. section 360bbb-3(b)(1), unless the authorization is terminated or revoked sooner.  Performed at [facility], [address], [place], [place] [address]     Radiology Reports DG Chest 2 View  Result Date: 03/01/2020 CLINICAL DATA:  Cough and congestion EXAM: CHEST - 2 VIEW COMPARISON:  11/15/2019, CT 05/21/2016 FINDINGS: Right-sided central venous port tip over the SVC. Chronic bronchitic changes. No focal opacity or pleural effusion. Normal cardiomediastinal silhouette. No pneumothorax. IMPRESSION: No active cardiopulmonary disease. Electronically Signed   By: Donavan Foil M.D.   On: 03/01/2020 23:18   CT CHEST W CONTRAST  Result Date: 03/02/2020 CLINICAL DATA:  Fever of unknown origin EXAM: CT CHEST WITH CONTRAST TECHNIQUE: Multidetector CT imaging of the chest was performed during intravenous contrast administration. CONTRAST:  89m OMNIPAQUE IOHEXOL 300 MG/ML  SOLN  COMPARISON:  None. FINDINGS: Cardiovascular: There is mild aortic valve calcifications seen. The heart size is normal. There is no pericardial thickening or effusion. Mediastinum/Nodes: There are no enlarged mediastinal, hilar or axillary lymph nodes.Scattered calcified prevascular and mediastinal lymph nodes are noted. A small amount of debris is seen within the esophagus. The bronchi and thyroid lobe are unremarkable. Lungs/Pleura: Tiny subpleural bleb seen  at both lung apices. There are no areas consolidation. No suspicious pulmonary nodules. There is no pleural effusion. Upper abdomen: The visualized portion of the upper abdomen is unremarkable. Musculoskeletal/Chest wall: There is no chest wall mass or suspicious osseous finding. No acute osseous abnormality IMPRESSION: No acute intrathoracic pathology. Findings which could be suggestive of prior granulomatous disease. Electronically Signed   By: Prudencio Pair M.D.   On: 03/02/2020 00:43    SIGNED: Deatra James, MD, FACP, FHM. [facility],  Pager (please use [email or web address] to page/text)  If 7PM-7AM, please contact night-coverage [email or web address], Password [facility] 03/02/2020, 7:25 AM

## 2020-03-02 NOTE — Progress Notes (Signed)
Patient arrives to unit with ED RN. Patient oriented to unit.

## 2020-03-02 NOTE — Plan of Care (Signed)

## 2020-03-02 NOTE — H&P (Signed)
History and Physical    [name]:[id number] DOB: Aug 23, 1955 DOA: 03/01/2020  PCP: Glean Hess, MD   Patient coming from:  Home to oncology office and then sent to ER.  Chief Complaint:  Fever, SOB, cough  HPI: Sarah [name] is a 64 y.o. female with medical history significant for multiple myeloma for which she is actively being treated with chemotherapy by Dr. Mike Gip.  She presents from the cancer center for further evaluation and admission for neutropenic fever.  She developed a fever this am and went to the cancer center where she was seen and had labs drawn, blood cultures, urinalysis and urine culture, and chest x-ray.  She received a dose of cefepime IV and was sent to the ED for admission.   She reports that 3 days ago she went out to eat with her sisters and she gagged and choked on a piece of meat.  Since then she has had a mild cough that got worse this a.m.  She reports having some phlegm production with the cough and also had a fever that developed this morning.  She reports having intermittent shortness of breath during the day. She reports that she is nauseated and has some mild to moderate burning epigastric pain which she had this morning as well but it is no worse.  She was noted to have fever in the emergency room and has had some chills.  She denies sore throat, vomiting, lower abdominal pain, dysuria, and diarrhea.  She has a little bit of constipation. Symptoms are acute in onset and severe nothing particular makes them better or worse.  She denies history of blood clots or PE.  She has not had any recent travel. She was vaccinated for COVID-19 with the second shot administered to her in April of this year.  She denies having any known sick contacts with Covid. She has history of smoking but quit 20 years ago.  She does states she lives with her sisters who both smoke in the house.  She denies alcohol or illicit drug use.  ED Course: She was noted to have fever  and was given ibuprofen.  Chest x-ray was negative for infiltrate.  She continues to have a cough.  She was given empiric antibiotics with cefepime and vancomycin for neutropenic fever.  Review of Systems:  General: Reports fever and chills since this a.m.  Denies weakness, weight loss, night sweats.  Denies dizziness.  Denies change in appetite HENT: Denies head trauma, headache, denies change in hearing, tinnitus.  Denies nasal congestion or bleeding.  Denies sore throat, sores in mouth.  Denies difficulty swallowing Eyes: Denies blurry vision, pain in eye, drainage.  Denies discoloration of eyes. Neck: Denies pain.  Denies swelling.  Denies pain with movement. Cardiovascular: Reports nonradiating substernal chest pain.  Denies palpitations.  Denies edema.  Denies orthopnea Respiratory: Reports shortness of breath with cough.  Reports white/yellow sputum production with cough. Denies wheezing.  Gastrointestinal: Denies abdominal pain, swelling.  Denies nausea, vomiting, diarrhea.  Denies melena.  Denies hematemesis. Musculoskeletal: Denies limitation of movement.  Denies deformity or swelling.  Denies pain.  Denies arthralgias or myalgias. Genitourinary: Denies pelvic pain.  Denies urinary frequency or hesitancy.  Denies dysuria.  Skin: Denies rash.  Denies petechiae, purpura, ecchymosis. Neurological: Denies headache.  Denies syncope.  Denies seizure activity.  Denies weakness or paresthesia.  Denies slurred speech, drooping face.  Denies visual change. Psychiatric: Denies depression, anxiety.  Denies suicidal thoughts or ideation.  Denies hallucinations.  Past Medical History:  Diagnosis Date  . Abnormal stress test 02/14/2016   Overview:  Added automatically from request for surgery [id number]  . Anemia   . Anxiety   . Arthritis   . Bicuspid aortic valve   . CHF (congestive heart failure) ([place])   . CKD (chronic kidney disease) stage 3, GFR 30-59 ml/min   . Depression   . Diabetes mellitus  ([place])   . Dizziness   . Fatty liver   . Frequent falls   . GERD (gastroesophageal reflux disease)   . Gout   . Heart murmur   . History of blood transfusion   . History of bone marrow transplant ([place])   . History of uterine fibroid   . Hx of cardiac catheterization 06/05/2016   Overview:  Normal coronaries 2017  . Hypertension   . Hypomagnesemia   . Multiple myeloma ([place])   . Personal history of chemotherapy   . Renal cyst     Past Surgical History:  Procedure Laterality Date  . ABDOMINAL HYSTERECTOMY    . 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 CARDIAC ELECTROPHYSIOLOGY MAPPING AND ABLATION    . CARPAL TUNNEL RELEASE Bilateral   . CHOLECYSTECTOMY  2008  . COLONOSCOPY WITH PROPOFOL N/A 05/07/2017   Procedure: COLONOSCOPY WITH PROPOFOL;  Surgeon: [name], MD;  Location: [facility] ENDOSCOPY;  Service: Gastroenterology;  Laterality: N/A;  . ESOPHAGOGASTRODUODENOSCOPY (EGD) WITH PROPOFOL N/A 05/07/2017   Procedure: ESOPHAGOGASTRODUODENOSCOPY (EGD) WITH PROPOFOL;  Surgeon: [name], MD;  Location: [facility] ENDOSCOPY;  Service: Gastroenterology;  Laterality: N/A;  . FOOT SURGERY Bilateral   . INCONTINENCE SURGERY  2009  . INTERSTIM IMPLANT PLACEMENT    . other     over active bladder  . OTHER SURGICAL HISTORY     bladder stimulator   . PARTIAL HYSTERECTOMY  03/1996   fibroids  . PORTA CATH INSERTION N/A 03/10/2019   Procedure: PORTA CATH INSERTION;  Surgeon: Algernon Huxley, MD;  Location: [place] [facility];  Service: Cardiovascular;  Laterality: N/A;  . TONSILLECTOMY  2007    Social History  reports that she quit smoking about 28 years ago. Her smoking use included cigarettes. She has a 20.00 pack-year smoking history. She has never used smokeless tobacco. She reports current alcohol use. She reports that she does not use drugs.  Allergies  Allergen Reactions  . Oxycodone-Acetaminophen Anaphylaxis    Swelling and rash  . Celebrex [Celecoxib] Diarrhea  . Codeine   . Plerixafor       In 2016 during ASCT collection patient developed fever to 103.39F and required hospitalization  . Benadryl [Diphenhydramine] Palpitations  . Morphine Itching and Rash  . Ondansetron Diarrhea  . Tylenol [Acetaminophen] Itching and Rash    Family History  Problem Relation Age of Onset  . Colon cancer Father   . Renal Disease Father   . Diabetes Mellitus II Father   . Melanoma Paternal Grandmother   . Breast cancer Maternal Aunt 21  . Anemia Mother   . Heart disease Mother   . Heart failure Mother   . Renal Disease Mother   . Congestive Heart Failure Mother   . Heart disease Maternal Uncle   . Throat cancer Maternal Uncle   . Lung cancer Maternal Uncle   . Liver disease Maternal Uncle   . Heart failure Maternal Uncle   . Hearing loss Son 33       Suicide      Prior to Admission medications  Medication Sig Start Date End Date Taking? Authorizing Provider  acetaminophen (TYLENOL) 500 MG tablet Take 500 mg by mouth every 6 (six) hours as needed.    Yes [provider]  acyclovir (ZOVIRAX) 400 MG tablet Take 1 tablet (400 mg total) by mouth 2 (two) times daily. 12/24/19  Yes Lequita Asal, MD  allopurinol (ZYLOPRIM) 100 MG tablet TAKE 1 TABLET(100 MG) BY MOUTH DAILY as needed 12/02/18  Yes Glean Hess, MD  aspirin 81 MG chewable tablet Chew 1 tablet (81 mg total) by mouth daily. 07/27/17  Yes Gladstone Lighter, MD  dexamethasone (DECADRON) 4 MG tablet Take 5 tablets (54m) on days 2, 16 of cycle 3,4, 5 and 6 Take 5 tablets (265m on days 2 of cycles 7 and beyond 02/18/20  Yes Corcoran, Melissa C, MD  diclofenac sodium (VOLTAREN) 1 % GEL Apply 2 g topically 4 (four) times daily. 01/25/19  Yes BeGertie BaronNP  diphenhydrAMINE (BENADRYL) 50 MG capsule Take 50 mg by mouth as needed.    Yes [provider]  fentaNYL (DURAGESIC - DOSED MCG/HR) 25 MCG/HR patch Place 25 mcg onto the skin every 3 (three) days.  08/12/16  Yes [provider]   fexofenadine (ALLEGRA) 180 MG tablet TAKE 1 TABLET(180 MG) BY MOUTH DAILY 05/30/17  Yes BeGlean HessMD  FLUoxetine (PROZAC) 40 MG capsule TAKE 1 CAPSULE EVERY DAY 09/30/19  Yes BeGlean HessMD  fluticasone ([facility]50 MCG/ACT nasal spray Place 2 sprays into both nostrils daily. 07/10/18  Yes BeGlean HessMD  glucose blood (ONE TOUCH ULTRA TEST) test strip  09/04/15  Yes [provider]  lactulose (CHRONULAC) 10 GM/15ML solution Take 20 g by mouth daily as needed.  10/09/19  Yes [provider]  lidocaine-prilocaine (EMLA) cream APPLY TOPICALLY AS NEEDED. 04/07/19  Yes Corcoran, MeDrue SecondMD  metFORMIN (GLUCOPHAGE-XR) 500 MG 24 hr tablet TAKE 1 TABLET (500 MG TOTAL) BY MOUTH DAILY WITH BREAKFAST. 01/28/20  Yes BeGlean HessMD  montelukast (SINGULAIR) 10 MG tablet Take 1 tablet by mouth on the day before treatment and for 2 days after treatment. 12/16/19  Yes CoLequita AsalMD  Multiple Vitamin (MULTIVITAMIN) tablet Take 1 tablet by mouth daily.   Yes [provider]  Multiple Vitamins-Minerals (HAIR/SKIN/NAILS) TABS Take 1 tablet by mouth daily.   Yes [provider]  omeprazole (PRILOSEC) 40 MG capsule TAKE 1 CAPSULE EVERY DAY 11/21/19  Yes BeGlean HessMD  pomalidomide (POMALYST) 3 MG capsule TAKE 1 CAPSULE BY MOUTH EVERY DAY   Rem's # [id number] obtained: 01/05/2020 01/14/20  Yes AlVerlon AuNP  promethazine (PHENERGAN) 25 MG tablet Take 1 tablet (25 mg total) by mouth every 6 (six) hours as needed for nausea or vomiting. 02/09/20  Yes Corcoran, MeDrue SecondMD  tiZANidine (ZANAFLEX) 4 MG tablet Take 1 tablet (4 mg total) by mouth every 8 (eight) hours as needed for muscle spasms. 12/02/18  Yes BeGlean HessMD  traZODone (DESYREL) 100 MG tablet TAKE 1 TABLET AT BEDTIME  FOR  SLEEP 01/28/20  Yes BeGlean HessMD  diphenoxylate-atropine (LOMOTIL) 2.5-0.025 MG tablet Take 2 tablets by mouth daily as needed for diarrhea or  loose stools. Patient not taking: Reported on 03/01/2020 09/23/19   CoLequita AsalMD  furosemide (LASIX) 20 MG tablet Take 20 mg by mouth as needed.  Patient not taking: Reported on 03/01/2020 12/09/18   [provider]  hydrOXYzine (ATARAX/VISTARIL) 10  MG tablet TAKE 1 TABLET(10 MG) BY MOUTH THREE TIMES DAILY AS NEEDED Patient not taking: Reported on 03/01/2020 05/30/17   Glean Hess, MD  lisinopril (ZESTRIL) 10 MG tablet Take 10 mg by mouth once.  Patient not taking: Reported on 03/01/2020 12/10/19   [provider]  NARCAN 4 MG/0.1ML LIQD nasal spray kit 1 spray.  Patient not taking: Reported on 03/01/2020 04/21/19   [provider]  promethazine-dextromethorphan (PROMETHAZINE-DM) 6.25-15 MG/5ML syrup Take 5 mLs by mouth 4 (four) times daily as needed for cough. Patient not taking: Reported on 02/21/2020 11/18/19   Lequita Asal, MD    Physical Exam: Vitals:   03/01/20 2141 03/01/20 2229 03/01/20 2312 03/01/20 2313  BP: 128/65 (!) 140/93 135/81   Pulse: 97 97 61 92  Resp: 18 20 20    Temp: 99.3 F (37.4 C) (!) 102.1 F (38.9 C)    TempSrc: Oral Oral    SpO2: 98% 96% 98% 98%  Weight:      Height:        Constitutional: NAD, calm, comfortable Vitals:   03/01/20 2141 03/01/20 2229 03/01/20 2312 03/01/20 2313  BP: 128/65 (!) 140/93 135/81   Pulse: 97 97 61 92  Resp: 18 20 20    Temp: 99.3 F (37.4 C) (!) 102.1 F (38.9 C)    TempSrc: Oral Oral    SpO2: 98% 96% 98% 98%  Weight:      Height:       General: WDWN, Alert and oriented x3.  Eyes: EOMI, PERRL, lids and conjunctivae normal.  Sclera nonicteric HENT:  [place]/AT, external ears normal.  Nares patent without epistasis.  Mucous membranes are moist. Posterior pharynx clear of any exudate or lesions. Neck: Soft, normal range of motion, supple, no masses, no thyromegaly.  Trachea midline Respiratory: Equal breath sounds.  Bibasilar Rales present. No wheezing or rhonchi, no crackles. Normal  respiratory effort. No accessory muscle use. Has intermittent hacking cough.  Cardiovascular: Regular rate and rhythm, no murmurs / rubs / gallops. No extremity edema. 2+ pedal pulses. Catheter in right upper chest noted. Abdomen: Soft, no tenderness, nondistended, no rebound or guarding.  No masses palpated. No hepatosplenomegaly. Bowel sounds normoactive Musculoskeletal: FROM. no clubbing / cyanosis. No joint deformity upper and lower extremities. no contractures. Normal muscle tone.  Skin: Warm, dry, intact no rashes, lesions, ulcers. No induration Neurologic: CN 2-12 grossly intact.  Normal speech.  Sensation intact, patella DTR +2 bilaterally. Strength 5/5 in all extremities.  Moves all extremities spontaneously Psychiatric: Normal judgment and insight.  Normal mood.    Labs on Admission: I have personally reviewed following labs and imaging studies  CBC: Recent Labs  Lab 02/28/20 0812 03/01/20 1141 03/01/20 1634  WBC 3.3* 2.4* 2.6*  NEUTROABS 1.5* 0.7* 0.8*  HGB 10.6* 10.4* 10.2*  HCT 31.9* 30.5* 31.1*  MCV 87.9 87.4 90.1  PLT 122* 150 366    Basic Metabolic Panel: Recent Labs  Lab 02/24/20 0817 02/28/20 0812 03/01/20 1141 03/01/20 1634  NA 137  --  139 137  K 4.1  --  4.2 4.3  CL 101  --  105 105  CO2 25  --  25 23  GLUCOSE 110*  --  110* 112*  BUN 26*  --  14 12  CREATININE 0.99  --  1.03* 0.88  CALCIUM 9.1  --  8.3* 8.5*  MG 1.1* 1.3*  --   --     GFR: Estimated Creatinine Clearance: 58.9 mL/min (by C-G formula  based on SCr of 0.88 mg/dL).  Liver Function Tests: Recent Labs  Lab 03/01/20 1141 03/01/20 1634  AST 26 25  ALT 23 23  ALKPHOS 85 74  BILITOT 0.2* 0.4  PROT 7.1 6.8  ALBUMIN 4.2 4.0    Urine analysis:    Component Value Date/Time   COLORURINE YELLOW (A) 03/01/2020 1306   APPEARANCEUR CLOUDY (A) 03/01/2020 1306   APPEARANCEUR Clear 07/13/2018 0835   LABSPEC 1.015 03/01/2020 1306   PHURINE 6.0 03/01/2020 1306   GLUCOSEU NEGATIVE  03/01/2020 1306   HGBUR NEGATIVE 03/01/2020 1306   BILIRUBINUR NEGATIVE 03/01/2020 1306   BILIRUBINUR neg 11/25/2018 1021   BILIRUBINUR Negative 07/13/2018 [place] 03/01/2020 1306   PROTEINUR 30 (A) 03/01/2020 1306   UROBILINOGEN 0.2 11/25/2018 1021   NITRITE POSITIVE (A) 03/01/2020 1306   LEUKOCYTESUR TRACE (A) 03/01/2020 1306    Radiological Exams on Admission: DG Chest 2 View  Result Date: 03/01/2020 CLINICAL DATA:  Cough and congestion EXAM: CHEST - 2 VIEW COMPARISON:  11/15/2019, CT 05/21/2016 FINDINGS: Right-sided central venous port tip over the SVC. Chronic bronchitic changes. No focal opacity or pleural effusion. Normal cardiomediastinal silhouette. No pneumothorax. IMPRESSION: No active cardiopulmonary disease. Electronically Signed   By: Donavan Foil M.D.   On: 03/01/2020 23:18    Assessment/Plan Principal Problem:   Neutropenic fever ([place]) Ms. Darwish has hx of multiple myeloma and is undergoing chemotherapy with oncology at this time. Last chemotherapy was 2 weeks ago. Developed fever this am with cough and SOB.  Found to have neutropenia on labs.  Placed on cefepime and vancomycin empirically with neutropenic fever. Cultures obtained in the emergency room and at oncology office.  Will monitor cultures and adjust antibiotic therapy as indicated. Patient has nitrites on urinalysis but she denies any urinary symptoms of frequency, hesitancy or dysuria.  She does have a bladder stimulator secondary to previous incontinence she states.  Active Problems:   Multiple myeloma not having achieved remission [facility]) Patient is actively being treated by oncology.  Oncology was consulted by the ER provider and oncology will see patient in the morning in consultation    Type II diabetes mellitus with complication ([place]) Blood sugars have been stable patient reports.  Last hemoglobin A1c in the computer is from April of this year so we will recheck hemoglobin A1c this  time. Metformin will be held for 48 hours after receiving contrast for CT of chest. Will monitor blood sugars before every meal and nightly.  Sliding scale insulin be provided as needed for glycemic control    Dyspnea Will obtain CT of the chest to make sure there is no underlying pneumonia with patient having fever, cough and dyspnea.  She reports that 3 days ago she did have an episode where she was eating and became choked and gagged on food that caused her to cough repeatedly.  Over the next few days cough has worsened and she has not developed fever.  CT of chest obtained to identify underlying pneumonia that may not be visualized on chest x-ray    DVT prophylaxis: Padua score elevated.  Lovenox for DVT prophylaxis Code Status:   Full code Family Communication:  Diagnosis and plan discussed with patient.  Patient verbalized understanding.  Questions answered.  Further recommendation to follow as clinically indicated. Disposition Plan:   Patient is from:  Home  Anticipated DC to:  Home  Anticipated DC date:  Anticipate greater than 2 midnight stay in the hospital to treat acute  medical condition  Anticipated DC barriers: No barriers to discharge identified at this time  Consults called:  Oncology, consulted by ER and will see patient in the morning Admission status:  Inpatient   Severity of Illness: The appropriate patient status for this patient is INPATIENT. Inpatient status is judged to be reasonable and necessary in order to provide the required intensity of service to ensure the patient's safety. The patient's presenting symptoms, physical exam findings, and initial radiographic and laboratory data in the context of their chronic comorbidities is felt to place them at high risk for further clinical deterioration. Furthermore, it is not anticipated that the patient will be medically stable for discharge from the hospital within 2 midnights of admission. The following factors support the  patient status of inpatient.    * I certify that at the point of admission it is my clinical judgment that the patient will require inpatient hospital care spanning beyond 2 midnights from the point of admission due to high intensity of service, high risk for further deterioration and high frequency of surveillance required.Yevonne Aline Sophiagrace Benbrook MD [facility]  How to contact the [facility] Attending or Consulting provider [place] or covering provider during after hours [place], for this patient?   1. Check the care team in [facility] and look for a) attending/consulting [facility] provider listed and b) the [facility] team listed 2. Log into [email or web address] and use [place]'s universal password to access. If you do not have the password, please contact the hospital operator. 3. Locate the [facility] provider you are looking for under [facility] and page to a number that you can be directly reached. 4. If you still have difficulty reaching the provider, please page the [facility] (Director on Call) for the Hospitalists listed on amion for assistance.  03/02/2020, 12:28 AM

## 2020-03-02 NOTE — Progress Notes (Signed)
Pharmacy Antibiotic Note  Sarah [name] is a 64 y.o. female admitted on 03/01/2020 with febrile neutropenia. Patient met criteria for sepsis and received one time doses of vanc, cefepime, and metronidazole. Differenital diagnosis includes, but not limited to, neutropenic fever, bacteremia, UTI, and PNA. UA is nitrate positive and elevated urine WBC count. Pharmacy has been consulted for Cefepime and Vancomycin dosing.  Plan: Will start Vancomycin 750 IV q12h and Cefepime 2g IV q8h -Monitor renal function and order vanc levels as appropriate Goal trough 15-20 mcg/mL.  Height: 5' 3"  (160 cm) Weight: 64 kg (141 lb) IBW/kg (Calculated) : 52.4  Temp (24hrs), Avg:99.6 F (37.6 C), Min:97.6 F (36.4 C), Max:102.1 F (38.9 C)  Recent Labs  Lab 02/28/20 0812 03/01/20 1141 03/01/20 1326 03/01/20 1634 03/01/20 2258 03/02/20 0551  WBC 3.3* 2.4*  --  2.6*  --   --   CREATININE  --  1.03*  --  0.88  --  0.82  LATICACIDVEN  --   --  0.8  --  1.3  --     Estimated Creatinine Clearance: 63.2 mL/min (by C-G formula based on SCr of 0.82 mg/dL).    Allergies  Allergen Reactions   Oxycodone-Acetaminophen Anaphylaxis    Swelling and rash   Celebrex [Celecoxib] Diarrhea   Codeine    Plerixafor     In 2016 during ASCT collection patient developed fever to 103.75F and required hospitalization   Benadryl [Diphenhydramine] Palpitations   Morphine Itching and Rash   Ondansetron Diarrhea   Tylenol [Acetaminophen] Itching and Rash    Antimicrobials this admission: 9/1 Vancomycin >>  9/1 Cefepime >>  9/1 Metronidazole >>   Microbiology results: BCx: UCx:    Thank you for allowing pharmacy to be a part of this patients care.  Sherilyn Banker, PharmD Pharmacy Resident  03/02/2020 9:11 AM

## 2020-03-03 DIAGNOSIS — E440 Moderate protein-calorie malnutrition: Secondary | ICD-10-CM | POA: Insufficient documentation

## 2020-03-03 DIAGNOSIS — N390 Urinary tract infection, site not specified: Secondary | ICD-10-CM | POA: Diagnosis present

## 2020-03-03 DIAGNOSIS — D61810 Antineoplastic chemotherapy induced pancytopenia: Secondary | ICD-10-CM | POA: Diagnosis present

## 2020-03-03 DIAGNOSIS — T451X5A Adverse effect of antineoplastic and immunosuppressive drugs, initial encounter: Secondary | ICD-10-CM | POA: Diagnosis present

## 2020-03-03 LAB — CBC
HCT: 28.4 % — ABNORMAL LOW (ref 36.0–46.0)
Hemoglobin: 10 g/dL — ABNORMAL LOW (ref 12.0–15.0)
MCH: 30.4 pg (ref 26.0–34.0)
MCHC: 35.2 g/dL (ref 30.0–36.0)
MCV: 86.3 fL (ref 80.0–100.0)
Platelets: 140 10*3/uL — ABNORMAL LOW (ref 150–400)
RBC: 3.29 MIL/uL — ABNORMAL LOW (ref 3.87–5.11)
RDW: 13.2 % (ref 11.5–15.5)
WBC: 2.2 10*3/uL — ABNORMAL LOW (ref 4.0–10.5)
nRBC: 0 % (ref 0.0–0.2)

## 2020-03-03 LAB — BASIC METABOLIC PANEL
Anion gap: 8 (ref 5–15)
BUN: 17 mg/dL (ref 8–23)
CO2: 24 mmol/L (ref 22–32)
Calcium: 8.8 mg/dL — ABNORMAL LOW (ref 8.9–10.3)
Chloride: 107 mmol/L (ref 98–111)
Creatinine, Ser: 0.93 mg/dL (ref 0.44–1.00)
GFR calc Af Amer: >60 mL/min (ref >60)
GFR calc non Af Amer: >60 mL/min (ref >60)
Glucose, Bld: 108 mg/dL — ABNORMAL HIGH (ref 70–99)
Potassium: 4.1 mmol/L (ref 3.5–5.1)
Sodium: 139 mmol/L (ref 135–145)

## 2020-03-03 LAB — GLUCOSE, CAPILLARY
Glucose-Capillary: 108 mg/dL — ABNORMAL HIGH (ref 70–99)
Glucose-Capillary: 147 mg/dL — ABNORMAL HIGH (ref 70–99)
Glucose-Capillary: 111 mg/dL — ABNORMAL HIGH (ref 70–99)
Glucose-Capillary: 125 mg/dL — ABNORMAL HIGH (ref 70–99)

## 2020-03-03 LAB — URINE CULTURE: Culture: >=100000 — AB

## 2020-03-03 MED ORDER — TIZANIDINE HCL 2 MG PO TABS
2.0000 mg | ORAL_TABLET | Freq: Four times a day (QID) | ORAL | Status: DC | PRN
  Administered 2020-03-03 – 2020-03-04 (×3): 2 mg via ORAL
  Filled 2020-03-03 (×5): qty 1

## 2020-03-03 MED ORDER — IBUPROFEN 400 MG PO TABS
600.0000 mg | ORAL_TABLET | Freq: Four times a day (QID) | ORAL | Status: DC | PRN
  Administered 2020-03-03: 600 mg via ORAL
  Filled 2020-03-03 (×2): qty 2

## 2020-03-03 MED ORDER — GUAIFENESIN-DM 100-10 MG/5ML PO SYRP
10.0000 mL | ORAL_SOLUTION | Freq: Four times a day (QID) | ORAL | Status: DC | PRN
  Administered 2020-03-03 – 2020-03-04 (×2): 10 mL via ORAL
  Filled 2020-03-03 (×2): qty 10

## 2020-03-03 MED ORDER — BENZONATATE 100 MG PO CAPS
200.0000 mg | ORAL_CAPSULE | Freq: Three times a day (TID) | ORAL | Status: DC
  Administered 2020-03-03 – 2020-03-05 (×7): 200 mg via ORAL
  Filled 2020-03-03 (×7): qty 2

## 2020-03-03 MED ORDER — CIPROFLOXACIN HCL 500 MG PO TABS
500.0000 mg | ORAL_TABLET | Freq: Two times a day (BID) | ORAL | Status: DC
  Administered 2020-03-03 – 2020-03-04 (×4): 500 mg via ORAL
  Filled 2020-03-03 (×6): qty 1

## 2020-03-03 NOTE — Progress Notes (Addendum)
PROGRESS NOTE    Patient: Sarah [name]                            PCP: Glean Hess, MD                    DOB: [birth year]            DOA: 03/01/2020 INO:[id number]             DOS: 03/03/2020, 4:03 PM   LOS: 1 day   Date of Service: The patient was seen and examined on 03/03/2020  Subjective:   The patient was seen and examined this morning, complaining of persistent cough, clear sputum. Dyspnea upon exertion.  Complaining of muscle cramping, no urinary complaints Afebrile normotensive No issues overnight  Brief Narrative:   Sarah [name] is a 64 y.o. female w PMH/o Multiple Myeloma for which she is actively being treated with chemotherapy by Dr. Mike Gip. She presents from the cancer center for further evaluation and admission for neutropenic fever. She had labs drawn, blood cultures, urinalysis and urine culture, and chest x-ray. She received a dose of cefepime IV and was sent to the ED for admission.     She was vaccinated for COVID-19 (4/21)  ED Course:  Tmax 102.1 WBC 2.6 given ibuprofen.  Chest x-ray was negative for infiltrate.    Abnormal urine in office, she continues to have a cough.  She was given empiric antibiotics with cefepime and vancomycin for neutropenic fever... Cultures were obtained.  SARS-CoV-2 negative  Assessment & Plan:   Principal Problem:   Neutropenic fever ([place]) Active Problems:   Antineoplastic chemotherapy induced pancytopenia (HCC)   Multiple myeloma not having achieved remission (HCC)   Type II diabetes mellitus with complication (HCC)   Depression, major, single episode, in partial remission (HCC)   Hepatic steatosis   Benign essential HTN   Dyspnea   Malnutrition of moderate degree   Acute lower UTI   Neutropenic fever (HCC) /UTI / R/o sepsis - bacteremia / Bronchitis -Ruling out sepsis/bacteremia-in a immunocompromised patient (patient did not meet sepsis criteria on admission) -Hemodynamically stable -Afebrile,  normotensive WBC 2.2, lactic acid 1.3, 1.1, procalcitonin <0.10 -As patient is immunocompromised, neutropenic: On broad-spectrum antibiotics of vancomycin/cefepime/Flagyl Narrowing down antibiotics accordingly -anticipating discontinuing Flagyl and cefepime, and adding Cipro Blood cultures negative to date -Urine cultures revealing Staphylococcus epididymis  In a setting of multiple myeloma and is undergoing chemotherapy with oncology at this time.  Last chemotherapy was 2 weeks ago. Her oncologist has been notified appreciate follow-up   -Appreciate oncology assistance  Patient has nitrites on urinalysis but she denies a yes ny urinary symptoms of frequency, hesitancy or dysuria.  She does have a bladder stimulator secondary to previous incontinence she states. -UA positive for leukocyte esterase, nitrites, many WBCs  -SARS-CoV-2 negative  Antineoplastic chemotherapy induced pancytopenia (HCC) Last chemotherapy 2 weeks ago -Monitoring very closely -Appreciate oncology assistance -Improving leukopenia -Thrombocytopenia-monitoring no signs of bleeding -Monitoring hemoglobin hematocrit   CBC Latest Ref Rng & Units 03/03/2020 03/02/2020 03/01/2020  WBC 4.0 - 10.5 K/uL 2.2(L) 1.9(L) 2.6(L)  Hemoglobin 12.0 - 15.0 g/dL 10.0(L) 9.6(L) 10.2(L)  Hematocrit 36 - 46 % 28.4(L) 28.8(L) 31.1(L)  Platelets 150 - 400 K/uL 140(L) 138(L) 157      Active Problems:   Multiple myeloma not having achieved remission ([place]) -Management per oncology -oncologist of been notified appreciate follow-up and input -Appreciate follow-up and input -  Last chemotherapy 2 weeks ago    Type II diabetes mellitus with complication (HCC) -Blood sugar stable -A1c 5.9, -Check in CBG QA CHS, SSI coverage -We will holding home medication of Metformin    Mild  Dyspnea -without respiratory failure-persistent cough -Continue cough suppressant and mucolytic's -CT of chest was reviewed-negative for intrathoracic  acute changes -We will continue to monitor closely, O2 supplement as needed, nebs as needed  Mild epigastric pain -with cough -Likely persisting cough We will continue mucolytic's, cough suppressants     Nutritional status:  Nutrition Problem: Moderate Malnutrition Etiology: chronic illness (multiple myeloma) Signs/Symptoms: moderate fat depletion, mild muscle depletion, moderate muscle depletion Interventions: Refer to RD note for recommendations     Cultures; Blood Cultures x 2 >> NGT Urine Culture  >>> Staph epididymis (questionable contaminant) Sputum Culture >> has not been obtained  Antimicrobials: Home medication of acyclovir Cefepime 2 g every 8 hours-03/03/2020.  Flagyl 500 mg IV every 8 hours -03/03/2020. Vancomycin 750 mg IV every 12 hours 03/03/2020 Cipro.  500 mg p.o. twice daily  Consultants: Oncology    ---------------------------------------------------------------------------------------------------------------------------------------------  DVT prophylaxis:  SCD/Compression stockings and Lovenox SQ Code Status:   Code Status: Full Code Family Communication: No family member present at bedside- attempt will be made to update daily The above findings and plan of care has been discussed with patient (and family )  in detail,  they expressed understanding and agreement of above. -Advance care planning has been discussed.   Admission status:    Status is: Inpatient  Remains inpatient appropriate because:Inpatient level of care appropriate due to severity of illness   Dispo: The patient is from: Home              Anticipated d/c is to: Home              Anticipated d/c date is: 3 days              Patient currently is not medically stable to d/c.        Procedures:   No admission procedures for hospital encounter.   Antimicrobials:  Anti-infectives (From admission, onward)   Start     Dose/Rate Route Frequency Ordered Stop   03/03/20 1330   ciprofloxacin (CIPRO) tablet 500 mg        500 mg Oral 2 times daily 03/03/20 1243     03/02/20 1400  vancomycin (VANCOREADY) IVPB 750 mg/150 mL  Status:  Discontinued        750 mg 150 mL/hr over 60 Minutes Intravenous Every 12 hours 03/02/20 0906 03/03/20 1305   03/02/20 1000  acyclovir (ZOVIRAX) 200 MG capsule 400 mg        400 mg Oral 2 times daily 03/02/20 0746     03/02/20 0930  ceFEPIme (MAXIPIME) 2 g in sodium chloride 0.9 % 100 mL IVPB  Status:  Discontinued        2 g 200 mL/hr over 30 Minutes Intravenous Every 8 hours 03/02/20 0846 03/03/20 1243   03/02/20 0930  metroNIDAZOLE (FLAGYL) IVPB 500 mg  Status:  Discontinued        500 mg 100 mL/hr over 60 Minutes Intravenous Every 8 hours 03/02/20 0846 03/03/20 1243   03/01/20 2315  ceFEPIme (MAXIPIME) 2 g in sodium chloride 0.9 % 100 mL IVPB        2 g 200 mL/hr over 30 Minutes Intravenous  Once 03/01/20 2303 03/02/20 0052   03/01/20 2315  metroNIDAZOLE (FLAGYL) IVPB 500 mg  500 mg 100 mL/hr over 60 Minutes Intravenous  Once 03/01/20 2303 03/02/20 0202   03/01/20 2315  vancomycin (VANCOCIN) IVPB 1000 mg/200 mL premix        1,000 mg 200 mL/hr over 60 Minutes Intravenous  Once 03/01/20 2303 03/02/20 0309       Medication:  . acyclovir  400 mg Oral BID  . allopurinol  100 mg Oral Daily  . aspirin  81 mg Oral Daily  . benzonatate  200 mg Oral TID  . ciprofloxacin  500 mg Oral BID  . enoxaparin (LOVENOX) injection  40 mg Subcutaneous Q24H  . fentaNYL  1 patch Transdermal Q72H  . FLUoxetine  40 mg Oral Daily  . multivitamin with minerals  1 tablet Oral Daily  . Ensure Max Protein  11 oz Oral BID BM  . sucralfate  1 g Oral BID    sodium chloride, guaiFENesin-dextromethorphan, ibuprofen, promethazine, senna-docusate, tiZANidine, traZODone   Objective:   Vitals:   03/02/20 1220 03/02/20 1557 03/02/20 2359 03/03/20 0752  BP: (!) 155/87 133/82 135/77 135/75  Pulse: 98 92 70 73  Resp: 16 16 16 16   Temp: 98 F (36.7  C) 99.4 F (37.4 C) 98.5 F (36.9 C) 98.5 F (36.9 C)  TempSrc: Oral Oral Oral Oral  SpO2: 96% 96% 97% 98%  Weight:      Height:        Intake/Output Summary (Last 24 hours) at 03/03/2020 1603 Last data filed at 03/03/2020 0250 Gross per 24 hour  Intake 1481.69 ml  Output --  Net 1481.69 ml   Filed Weights   03/01/20 1511  Weight: 64 kg     Examination:      Physical Exam:   General:  Alert, oriented, cooperative, no distress;   HEENT:  Normocephalic, PERRL, otherwise with in Normal limits   Neuro:  CNII-XII intact. , normal motor and sensation, reflexes intact   Lungs:   Clear to auscultation BL, Respirations unlabored, no wheezes / crackles  Cardio:    S1/S2, RRR, No murmure, No Rubs or Gallops   Abdomen:   Soft, non-tender, bowel sounds active all four quadrants,  no guarding or peritoneal signs.  Muscular skeletal:  Limited exam - in bed, able to move all 4 extremities, Normal strength,  2+ pulses,  symmetric, No pitting edema  Skin:  Dry, warm to touch, negative for any Rashes, No open wounds  Wounds: Please see nursing documentation       ------------------------------------------------------------------------------------------------------------------------------------------    LABs:  CBC Latest Ref Rng & Units 03/03/2020 03/02/2020 03/01/2020  WBC 4.0 - 10.5 K/uL 2.2(L) 1.9(L) 2.6(L)  Hemoglobin 12.0 - 15.0 g/dL 10.0(L) 9.6(L) 10.2(L)  Hematocrit 36 - 46 % 28.4(L) 28.8(L) 31.1(L)  Platelets 150 - 400 K/uL 140(L) 138(L) 157   CMP Latest Ref Rng & Units 03/03/2020 03/02/2020 03/01/2020  Glucose 70 - 99 mg/dL 108(H) 103(H) 112(H)  BUN 8 - 23 mg/dL 17 10 12   Creatinine 0.44 - 1.00 mg/dL 0.93 0.82 0.88  Sodium 135 - 145 mmol/L 139 139 137  Potassium 3.5 - 5.1 mmol/L 4.1 4.1 4.3  Chloride 98 - 111 mmol/L 107 108 105  CO2 22 - 32 mmol/L 24 27 23   Calcium 8.9 - 10.3 mg/dL 8.8(L) 8.0(L) 8.5(L)  Total Protein 6.5 - 8.1 g/dL - - 6.8  Total Bilirubin 0.3 - 1.2 mg/dL - -  0.4  Alkaline Phos 38 - 126 U/L - - 74  AST 15 - 41 U/L - - 25  ALT 0 - 44 U/L - - 23       Micro Results Recent Results (from the past 240 hour(s))  Urine Culture     Status: Abnormal   Collection Time: 03/01/20  1:06 PM   Specimen: Urine, Clean Catch  Result Value Ref Range Status   Specimen Description   Final    URINE, CLEAN CATCH Performed at [facility], [address], [place], [place] [address]    Special Requests   Final    NONE Performed at [facility], [address] [place], [place] [address]    Culture >=100,000 COLONIES/mL STAPHYLOCOCCUS EPIDERMIDIS (A)  Final   Report Status 03/03/2020 FINAL  Final   Organism ID, Bacteria STAPHYLOCOCCUS EPIDERMIDIS (A)  Final      Susceptibility   Staphylococcus epidermidis - MIC*    CIPROFLOXACIN <=0.5 SENSITIVE Sensitive     GENTAMICIN <=0.5 SENSITIVE Sensitive     NITROFURANTOIN <=16 SENSITIVE Sensitive     OXACILLIN <=0.25 SENSITIVE Sensitive     TETRACYCLINE >=16 RESISTANT Resistant     VANCOMYCIN 1 SENSITIVE Sensitive     TRIMETH/SULFA <=10 SENSITIVE Sensitive     CLINDAMYCIN <=0.25 SENSITIVE Sensitive     RIFAMPIN <=0.5 SENSITIVE Sensitive     Inducible Clindamycin NEGATIVE Sensitive     * >=100,000 COLONIES/mL STAPHYLOCOCCUS EPIDERMIDIS  Culture, blood (Routine X 2) w Reflex to ID Panel     Status: None (Preliminary result)   Collection Time: 03/01/20  1:29 PM   Specimen: BLOOD  Result Value Ref Range Status   Specimen Description   Final    BLOOD RIGHT ARM Performed at [facility], [address] [place], [place] [address]    Special Requests   Final    BOTTLES DRAWN AEROBIC AND ANAEROBIC Blood Culture adequate volume Performed at [facility], [address] [place], [place] [address]    Culture   Final    NO GROWTH 2 DAYS Performed at [facility], [address], [place], [place] [address]    Report Status PENDING  Incomplete  Culture, blood (Routine X 2) w  Reflex to ID Panel     Status: None (Preliminary result)   Collection Time: 03/01/20  1:50 PM   Specimen: BLOOD  Result Value Ref Range Status   Specimen Description   Final    BLOOD ARM RIGHT Performed at [facility], [address], [place], [place] [address]    Special Requests   Final    BOTTLES DRAWN AEROBIC AND ANAEROBIC Blood Culture adequate volume Performed at [facility], [address] [place], [place] [address]    Culture   Final    NO GROWTH 2 DAYS Performed at [facility], [address] [place], [place] [address]    Report Status PENDING  Incomplete  SARS Coronavirus 2 by RT PCR (hospital order, performed in [facility] hospital lab) Nasopharyngeal Nasopharyngeal Swab     Status: None   Collection Time: 03/01/20 11:33 PM   Specimen: Nasopharyngeal Swab  Result Value Ref Range Status   SARS Coronavirus 2 NEGATIVE NEGATIVE Final    Comment: (NOTE) SARS-CoV-2 target nucleic acids are NOT DETECTED.  istory, and epidemiological information.  Performed at [facility] [address], [place], [place] [address]     Radiology Reports DG Chest 2 View  Result Date: 03/01/2020 CLINICAL DATA:  Cough and congestion EXAM: CHEST - 2 VIEW COMPARISON:  11/15/2019, CT 05/21/2016 FINDINGS: Right-sided central  venous port tip over the SVC. Chronic bronchitic changes. No focal opacity or pleural effusion. Normal cardiomediastinal silhouette. No pneumothorax. IMPRESSION: No active cardiopulmonary disease. Electronically Signed   By: Donavan Foil M.D.   On: 03/01/2020 23:18   CT CHEST W CONTRAST  Result Date: 03/02/2020 CLINICAL DATA:  Fever of unknown origin EXAM: CT CHEST WITH CONTRAST TECHNIQUE: Multidetector CT imaging of the chest was performed during intravenous contrast administration. CONTRAST:  46m OMNIPAQUE IOHEXOL 300 MG/ML  SOLN COMPARISON:  None. FINDINGS: Cardiovascular: There is mild aortic valve calcifications seen. The heart size  is normal. There is no pericardial thickening or effusion. Mediastinum/Nodes: There are no enlarged mediastinal, hilar or axillary lymph nodes.Scattered calcified prevascular and mediastinal lymph nodes are noted. A small amount of debris is seen within the esophagus. The bronchi and thyroid lobe are unremarkable. Lungs/Pleura: Tiny subpleural bleb seen at both lung apices. There are no areas consolidation. No suspicious pulmonary nodules. There is no pleural effusion. Upper abdomen: The visualized portion of the upper abdomen is unremarkable. Musculoskeletal/Chest wall: There is no chest wall mass or suspicious osseous finding. No acute osseous abnormality IMPRESSION: No acute intrathoracic pathology. Findings which could be suggestive of prior granulomatous disease. Electronically Signed   By: BPrudencio PairM.D.   On: 03/02/2020 00:43    SIGNED: SDeatra James MD, FACP, FHM. [facility],  Pager (please use [email or web address] to page/text)  If 7PM-7AM, please contact night-coverage [email or web address] [facility]9/08/2019, 4:03 PM

## 2020-03-03 NOTE — Consult Note (Signed)
[facility]  Date of admission:  03/02/2020  Inpatient day:  03/03/2020  Consulting physician: Dr Skipper Cliche.  Reason for Consultation:  Multiple myeloma and fever  Chief Complaint: Sarah [name] is a 64 y.o. female with lambda light chain multiple myeloma who was admitted from the [facility] with fever and neutropenia.  HPI:  The patient has recurrent multiple myeloma s/p autologous stem cell transplant.  She is currently day 26 of cycle #5 daratumumab, Pomalyst and Decadron.  She was last seen in the medical oncology clinic on 02/21/2020.  At that time, she received day 15 daratumumab.  She has completed Pomalyst the day before.  She was feeling better.  She denied any fevers.  CBC revealed a hematocrit of 33.0, hemoglobin 11.2, platelets 100,000, WBC 4900 (ANC 3600).  She presented to the symptom management clinic on 03/01/2020 with a fever of 99.9, cough productive of yellow sputum, sinus congestion, and fatigue.  CBC revealed a hematocrit of 30.5, hemoglobin 10.4, WBC 2400 with an ANC of 700.  She underwent a fever work-up and was started on Cefepime.  Urinalysis revealed positive nitrites, trace leukocytes, and many bacteria.  Urine culture revealed > 100,000 colonies of staph epidermidis.  CXR revealed no active cardiopulmonary disease.  Blood cultures were negative.   Chest CT on 03/02/2020 revealed no acute intrathoracic pathology .  There was suggestion of granulomatous disease.  She was initially placed on Cefepime, vancomycin and Flagyl.  She is currently on ciprofloxacin, acyclovir.  CBC today reveals a hematocrit of 28.4, hemoglobin 10.0, platelets 140,000, and WBC 2200.  Symptomatically, she remains fatigued.  She has a non-productive cough.  She denies any urinary symptoms.   Past Medical History:  Diagnosis Date  . Abnormal stress test 02/14/2016   Overview:  Added automatically from request for surgery [id number]  . Anemia   . Anxiety   .  Arthritis   . Bicuspid aortic valve   . CHF (congestive heart failure) ([place])   . CKD (chronic kidney disease) stage 3, GFR 30-59 ml/min   . Depression   . Diabetes mellitus ([place])   . Dizziness   . Fatty liver   . Frequent falls   . GERD (gastroesophageal reflux disease)   . Gout   . Heart murmur   . History of blood transfusion   . History of bone marrow transplant ([place])   . History of uterine fibroid   . Hx of cardiac catheterization 06/05/2016   Overview:  Normal coronaries 2017  . Hypertension   . Hypomagnesemia   . Multiple myeloma ([place])   . Personal history of chemotherapy   . Renal cyst     Past Surgical History:  Procedure Laterality Date  . ABDOMINAL HYSTERECTOMY    . 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 CARDIAC ELECTROPHYSIOLOGY MAPPING AND ABLATION    . CARPAL TUNNEL RELEASE Bilateral   . CHOLECYSTECTOMY  2008  . COLONOSCOPY WITH PROPOFOL N/A 05/07/2017   Procedure: COLONOSCOPY WITH PROPOFOL;  Surgeon: [name], MD;  Location: [facility] ENDOSCOPY;  Service: Gastroenterology;  Laterality: N/A;  . ESOPHAGOGASTRODUODENOSCOPY (EGD) WITH PROPOFOL N/A 05/07/2017   Procedure: ESOPHAGOGASTRODUODENOSCOPY (EGD) WITH PROPOFOL;  Surgeon: [name], MD;  Location: [facility] ENDOSCOPY;  Service: Gastroenterology;  Laterality: N/A;  . FOOT SURGERY Bilateral   . INCONTINENCE SURGERY  2009  . INTERSTIM IMPLANT PLACEMENT    . other     over active bladder  . OTHER SURGICAL HISTORY     bladder stimulator   .  PARTIAL HYSTERECTOMY  03/1996   fibroids  . PORTA CATH INSERTION N/A 03/10/2019   Procedure: PORTA CATH INSERTION;  Surgeon: Algernon Huxley, MD;  Location: [facility];  Service: Cardiovascular;  Laterality: N/A;  . TONSILLECTOMY  2007    Family History  Problem Relation Age of Onset  . Colon cancer Father   . Renal Disease Father   . Diabetes Mellitus II Father   . Melanoma Paternal Grandmother   . Breast cancer Maternal Aunt 45  . Anemia Mother   . Heart disease  Mother   . Heart failure Mother   . Renal Disease Mother   . Congestive Heart Failure Mother   . Heart disease Maternal Uncle   . Throat cancer Maternal Uncle   . Lung cancer Maternal Uncle   . Liver disease Maternal Uncle   . Heart failure Maternal Uncle   . Hearing loss Son 71       Suicide     Social History:  reports that she quit smoking about 28 years ago. Her smoking use included cigarettes. She has a 20.00 pack-year smoking history. She has never used smokeless tobacco. She reports current alcohol use. She reports that she does not use drugs.  The patient denies any exposure to radiation or toxins.  The patient lives in [place].  She is alone today.  Allergies:  Allergies  Allergen Reactions  . Oxycodone-Acetaminophen Anaphylaxis    Swelling and rash  . Celebrex [Celecoxib] Diarrhea  . Codeine   . Plerixafor     In 2016 during ASCT collection patient developed fever to 103.30F and required hospitalization  . Benadryl [Diphenhydramine] Palpitations  . Morphine Itching and Rash  . Ondansetron Diarrhea  . Tylenol [Acetaminophen] Itching and Rash    Medications Prior to Admission  Medication Sig Dispense Refill  . acetaminophen (TYLENOL) 500 MG tablet Take 500 mg by mouth every 6 (six) hours as needed.     Marland Kitchen acyclovir (ZOVIRAX) 400 MG tablet Take 1 tablet (400 mg total) by mouth 2 (two) times daily. 60 tablet 11  . allopurinol (ZYLOPRIM) 100 MG tablet TAKE 1 TABLET(100 MG) BY MOUTH DAILY as needed 90 tablet 1  . aspirin 81 MG chewable tablet Chew 1 tablet (81 mg total) by mouth daily. 30 tablet 0  . dexamethasone (DECADRON) 4 MG tablet Take 5 tablets (74m) on days 2, 16 of cycle 3,4, 5 and 6 Take 5 tablets (282m on days 2 of cycles 7 and beyond 50 tablet 0  . diclofenac sodium (VOLTAREN) 1 % GEL Apply 2 g topically 4 (four) times daily. 100 g 1  . diphenhydrAMINE (BENADRYL) 50 MG capsule Take 50 mg by mouth as needed.     . fentaNYL (DURAGESIC - DOSED MCG/HR) 25 MCG/HR  patch Place 25 mcg onto the skin every 3 (three) days.     . fexofenadine (ALLEGRA) 180 MG tablet TAKE 1 TABLET(180 MG) BY MOUTH DAILY 30 tablet 5  . FLUoxetine (PROZAC) 40 MG capsule TAKE 1 CAPSULE EVERY DAY 90 capsule 1  . fluticasone (FLONASE) 50 MCG/ACT nasal spray Place 2 sprays into both nostrils daily. 16 g 2  . glucose blood (ONE TOUCH ULTRA TEST) test strip     . lactulose (CHRONULAC) 10 GM/15ML solution Take 20 g by mouth daily as needed.     . lidocaine-prilocaine (EMLA) cream APPLY TOPICALLY AS NEEDED. 30 g 0  . metFORMIN (GLUCOPHAGE-XR) 500 MG 24 hr tablet TAKE 1 TABLET (500 MG TOTAL) BY MOUTH  DAILY WITH BREAKFAST. 90 tablet 1  . montelukast (SINGULAIR) 10 MG tablet Take 1 tablet by mouth on the day before treatment and for 2 days after treatment. 30 tablet 0  . Multiple Vitamin (MULTIVITAMIN) tablet Take 1 tablet by mouth daily.    . Multiple Vitamins-Minerals (HAIR/SKIN/NAILS) TABS Take 1 tablet by mouth daily.    Marland Kitchen omeprazole (PRILOSEC) 40 MG capsule TAKE 1 CAPSULE EVERY DAY 90 capsule 3  . pomalidomide (POMALYST) 3 MG capsule TAKE 1 CAPSULE BY MOUTH EVERY DAY   Rem's # [id number] Date obtained: 01/05/2020 21 capsule 0  . promethazine (PHENERGAN) 25 MG tablet Take 1 tablet (25 mg total) by mouth every 6 (six) hours as needed for nausea or vomiting. 90 tablet 0  . tiZANidine (ZANAFLEX) 4 MG tablet Take 1 tablet (4 mg total) by mouth every 8 (eight) hours as needed for muscle spasms. 270 tablet 1  . traZODone (DESYREL) 100 MG tablet TAKE 1 TABLET AT BEDTIME  FOR  SLEEP 90 tablet 0  . diphenoxylate-atropine (LOMOTIL) 2.5-0.025 MG tablet Take 2 tablets by mouth daily as needed for diarrhea or loose stools. (Patient not taking: Reported on 03/01/2020) 45 tablet 2  . furosemide (LASIX) 20 MG tablet Take 20 mg by mouth as needed.  (Patient not taking: Reported on 03/01/2020)    . hydrOXYzine (ATARAX/VISTARIL) 10 MG tablet TAKE 1 TABLET(10 MG) BY MOUTH THREE TIMES DAILY AS NEEDED (Patient not  taking: Reported on 03/01/2020) 90 tablet 0  . lisinopril (ZESTRIL) 10 MG tablet Take 10 mg by mouth once.  (Patient not taking: Reported on 03/01/2020)    . NARCAN 4 MG/0.1ML LIQD nasal spray kit 1 spray.  (Patient not taking: Reported on 03/01/2020)    . promethazine-dextromethorphan (PROMETHAZINE-DM) 6.25-15 MG/5ML syrup Take 5 mLs by mouth 4 (four) times daily as needed for cough. (Patient not taking: Reported on 02/21/2020) 240 mL 0    Review of Systems: GENERAL:  Fatigue.  No fevers, sweats or weight loss. PERFORMANCE STATUS (ECOG):  1-2 HEENT:  Sinus congestion with yellow secretions now clearing.  No visual changes, sore throat, mouth sores or tenderness. Lungs: Shortness of breath with exertion.  Non-productive cough.  No hemoptysis. Cardiac:  No chest pain, palpitations, orthopnea, or PND. GI:  Nausea.  Appetite diminished.  Constipation.  No vomiting, diarrhea, melena or hematochezia. GU:  No urgency, frequency, dysuria, or hematuria. Musculoskeletal:  No back pain.  No joint pain.  No muscle tenderness. Extremities:  No pain or swelling. Skin:  No rashes or skin changes. Neuro:  Chronic neuropathy in fingers and toes.  No headache, focal weakness, balance or coordination issues. Endocrine:  Diabetes.  No hyroid issues, hot flashes or night sweats. Psych:  No mood changes, depression or anxiety. Pain:  No focal pain. Review of systems:  All other systems reviewed and found to be negative.  Physical Exam:  Blood pressure 114/78, pulse 66, temperature 98.1 F (36.7 C), temperature source Oral, resp. rate 16, height 5' 3"  (1.6 m), weight 141 lb (64 kg), SpO2 98 %.  GENERAL:  Slightly fatigued appearing woman sitting comfortably on the medical unit in no acute distress. MENTAL STATUS:  Alert and oriented to person, place and time. HEAD:  Curly blonde hair.  Normocephalic, atraumatic, face symmetric, no Cushingoid features. EYES:  Glasses.  Brown eyes.  Pupils equal round and reactive to  light and accomodation.  No conjunctivitis or scleral icterus. ENT:  Oropharynx clear without lesion.  Tongue normal. Mucous membranes moist.  RESPIRATORY:  Clear to auscultation without rales, wheezes or rhonchi. CARDIOVASCULAR:  Regular rate and rhythm without murmur, rub or gallop. ABDOMEN:  Soft, slightly tender in the epigastric region without guarding or rebound tenderness.  Active bowel sounds and no hepatosplenomegaly.  No masses. SKIN:  No rashes, ulcers or lesions. EXTREMITIES: No edema, no skin discoloration or tenderness.  No palpable cords. LYMPH NODES: No palpable cervical, supraclavicular, or inguinal adenopathy  NEUROLOGICAL: Unremarkable. PSYCH:  Appropriate.   Results for orders placed or performed during the hospital encounter of 03/01/20 (from the past 48 hour(s))  Lactic acid, plasma     Status: None   Collection Time: 03/01/20 10:58 PM  Result Value Ref Range   Lactic Acid, Venous 1.3 0.5 - 1.9 mmol/L    Comment: Performed at [facility], [address] [place], [place] [address]  Hemoglobin A1c     Status: Abnormal   Collection Time: 03/01/20 10:58 PM  Result Value Ref Range   Hgb A1c MFr Bld 5.9 (H) 4.8 - 5.6 %    Comment: (NOTE) Pre diabetes:          5.7%-6.4%  Diabetes:              >6.4%  Glycemic control for   <7.0% adults with diabetes    Mean Plasma Glucose 122.63 mg/dL    Comment: Performed at [facility], [place] [address], [place], [place] [address]  SARS Coronavirus 2 by RT PCR (hospital order, performed in [facility] hospital lab) Nasopharyngeal Nasopharyngeal Swab     Status: None   Collection Time: 03/01/20 11:33 PM   Specimen: Nasopharyngeal Swab  Result Value Ref Range   SARS Coronavirus 2 NEGATIVE NEGATIVE    Comment: (NOTE) SARS-CoV-2 target nucleic acids are NOT DETECTED.  The SARS-CoV-2 RNA is generally detectable in upper and lower respiratory specimens during the acute phase of infection. The lowest concentration  of SARS-CoV-2 viral copies this assay can detect is 250 copies / mL. A negative result does not preclude SARS-CoV-2 infection and should not be used as the sole basis for treatment or other patient management decisions.  A negative result may occur with improper specimen collection / handling, submission of specimen other than nasopharyngeal swab, presence of viral mutation(s) within the areas targeted by this assay, and inadequate number of viral copies (<250 copies / mL). A negative result must be combined with clinical observations, patient history, and epidemiological information.  Fact Sheet for Patients:   StrictlyIdeas.no  Fact Sheet for Healthcare Providers: BankingDealers.co.za  This test is not yet approved or  cleared by the [place] FDA and has been authorized for detection and/or diagnosis of SARS-CoV-2 by FDA under an Emergency Use Authorization (EUA).  This EUA will remain in effect (meaning this test can be used) for the duration of the COVID-19 declaration under Section 564(b)(1) of the Act, 21 U.S.C. section 360bbb-3(b)(1), unless the authorization is terminated or revoked sooner.  Performed at [facility], [address] [place], [place] [address]   Basic metabolic panel     Status: Abnormal   Collection Time: 03/02/20  5:51 AM  Result Value Ref Range   Sodium 139 135 - 145 mmol/L   Potassium 4.1 3.5 - 5.1 mmol/L   Chloride 108 98 - 111 mmol/L   CO2 27 22 - 32 mmol/L   Glucose, Bld 103 (H) 70 - 99 mg/dL    Comment: Glucose reference range applies only to samples taken after fasting  for at least 8 hours.   BUN 10 8 - 23 mg/dL   Creatinine, Ser 0.82 0.44 - 1.00 mg/dL   Calcium 8.0 (L) 8.9 - 10.3 mg/dL   GFR calc non Af Amer >60 >60 mL/min   GFR calc Af Amer >60 >60 mL/min   Anion gap 4 (L) 5 - 15    Comment: Performed at [facility], [address] [place], [place] [address]   Magnesium     Status: Abnormal   Collection Time: 03/02/20  5:51 AM  Result Value Ref Range   Magnesium 1.4 (L) 1.7 - 2.4 mg/dL    Comment: Performed at [facility], [address] [place], [place] [address]  Glucose, capillary     Status: None   Collection Time: 03/02/20  8:43 AM  Result Value Ref Range   Glucose-Capillary 93 70 - 99 mg/dL    Comment: Glucose reference range applies only to samples taken after fasting for at least 8 hours.  CBC with Differential/Platelet     Status: Abnormal   Collection Time: 03/02/20  9:48 AM  Result Value Ref Range   WBC 1.9 (L) 4.0 - 10.5 K/uL   RBC 3.20 (L) 3.87 - 5.11 MIL/uL   Hemoglobin 9.6 (L) 12.0 - 15.0 g/dL   HCT 28.8 (L) 36 - 46 %   MCV 90.0 80.0 - 100.0 fL   MCH 30.0 26.0 - 34.0 pg   MCHC 33.3 30.0 - 36.0 g/dL   RDW 13.2 11.5 - 15.5 %   Platelets 138 (L) 150 - 400 K/uL   nRBC 0.0 0.0 - 0.2 %   Neutrophils Relative % 25 %   Neutro Abs 0.5 (L) 1.7 - 7.7 K/uL   Lymphocytes Relative 32 %   Lymphs Abs 0.6 (L) 0.7 - 4.0 K/uL   Monocytes Relative 34 %   Monocytes Absolute 0.7 0 - 1 K/uL   Eosinophils Relative 6 %   Eosinophils Absolute 0.1 0 - 0 K/uL   Basophils Relative 2 %   Basophils Absolute 0.0 0 - 0 K/uL   WBC Morphology MORPHOLOGY UNREMARKABLE    RBC Morphology MORPHOLOGY UNREMARKABLE    Smear Review Normal platelet morphology    Immature Granulocytes 1 %   Abs Immature Granulocytes 0.01 0.00 - 0.07 K/uL    Comment: Performed at [facility], [address] [place], [place] [address]  Lactic acid, plasma     Status: None   Collection Time: 03/02/20  9:48 AM  Result Value Ref Range   Lactic Acid, Venous 1.1 0.5 - 1.9 mmol/L    Comment: Performed at [facility], [address] [place], [place] [address]  Glucose, capillary     Status: Abnormal   Collection Time: 03/02/20 11:39 AM  Result Value Ref Range   Glucose-Capillary 122 (H) 70 - 99 mg/dL    Comment: Glucose reference range applies  only to samples taken after fasting for at least 8 hours.   Comment 1 Notify RN    Comment 2 Document in Chart   Glucose, capillary     Status: Abnormal   Collection Time: 03/02/20  4:56 PM  Result Value Ref Range   Glucose-Capillary 104 (H) 70 - 99 mg/dL    Comment: Glucose reference range applies only to samples taken after fasting for at least 8 hours.   Comment 1 Notify RN    Comment 2 Document in Chart   Glucose, capillary     Status: Abnormal  Collection Time: 03/02/20  8:25 PM  Result Value Ref Range   Glucose-Capillary 136 (H) 70 - 99 mg/dL    Comment: Glucose reference range applies only to samples taken after fasting for at least 8 hours.  CBC     Status: Abnormal   Collection Time: 03/03/20  5:26 AM  Result Value Ref Range   WBC 2.2 (L) 4.0 - 10.5 K/uL   RBC 3.29 (L) 3.87 - 5.11 MIL/uL   Hemoglobin 10.0 (L) 12.0 - 15.0 g/dL   HCT 28.4 (L) 36 - 46 %   MCV 86.3 80.0 - 100.0 fL   MCH 30.4 26.0 - 34.0 pg   MCHC 35.2 30.0 - 36.0 g/dL   RDW 13.2 11.5 - 15.5 %   Platelets 140 (L) 150 - 400 K/uL   nRBC 0.0 0.0 - 0.2 %    Comment: Performed at [facility], [address] [place], [place] [address]  Basic metabolic panel     Status: Abnormal   Collection Time: 03/03/20  5:26 AM  Result Value Ref Range   Sodium 139 135 - 145 mmol/L   Potassium 4.1 3.5 - 5.1 mmol/L   Chloride 107 98 - 111 mmol/L   CO2 24 22 - 32 mmol/L   Glucose, Bld 108 (H) 70 - 99 mg/dL    Comment: Glucose reference range applies only to samples taken after fasting for at least 8 hours.   BUN 17 8 - 23 mg/dL   Creatinine, Ser 0.93 0.44 - 1.00 mg/dL   Calcium 8.8 (L) 8.9 - 10.3 mg/dL   GFR calc non Af Amer >60 >60 mL/min   GFR calc Af Amer >60 >60 mL/min   Anion gap 8 5 - 15    Comment: Performed at [facility], [address] [place], [place] [address]  Glucose, capillary     Status: Abnormal   Collection Time: 03/03/20  7:29 AM  Result Value Ref Range   Glucose-Capillary 108  (H) 70 - 99 mg/dL    Comment: Glucose reference range applies only to samples taken after fasting for at least 8 hours.  Glucose, capillary     Status: Abnormal   Collection Time: 03/03/20 11:31 AM  Result Value Ref Range   Glucose-Capillary 147 (H) 70 - 99 mg/dL    Comment: Glucose reference range applies only to samples taken after fasting for at least 8 hours.  Glucose, capillary     Status: Abnormal   Collection Time: 03/03/20  5:23 PM  Result Value Ref Range   Glucose-Capillary 111 (H) 70 - 99 mg/dL    Comment: Glucose reference range applies only to samples taken after fasting for at least 8 hours.   CT CHEST W CONTRAST  Result Date: 03/02/2020 CLINICAL DATA:  Fever of unknown origin EXAM: CT CHEST WITH CONTRAST TECHNIQUE: Multidetector CT imaging of the chest was performed during intravenous contrast administration. CONTRAST:  15m OMNIPAQUE IOHEXOL 300 MG/ML  SOLN COMPARISON:  None. FINDINGS: Cardiovascular: There is mild aortic valve calcifications seen. The heart size is normal. There is no pericardial thickening or effusion. Mediastinum/Nodes: There are no enlarged mediastinal, hilar or axillary lymph nodes.Scattered calcified prevascular and mediastinal lymph nodes are noted. A small amount of debris is seen within the esophagus. The bronchi and thyroid lobe are unremarkable. Lungs/Pleura: Tiny subpleural bleb seen at both lung apices. There are no areas consolidation. No suspicious pulmonary nodules. There is no pleural effusion. Upper abdomen: The visualized portion of the upper abdomen  is unremarkable. Musculoskeletal/Chest wall: There is no chest wall mass or suspicious osseous finding. No acute osseous abnormality IMPRESSION: No acute intrathoracic pathology. Findings which could be suggestive of prior granulomatous disease. Electronically Signed   By: Prudencio Pair M.D.   On: 03/02/2020 00:43    Assessment:  The patient is a 64 y.o. woman with lambda light chain multiple myeloma  currently day 26 of cycle #5 daratumumab, Pomalyst and Decadron who was admitted from the [facility] with fever and neutropenia.  Fever work-up was negative. Urinalysis revealed > 100,000 colonies of staph epidermidis (? contaminant).  CXR revealed no active cardiopulmonary disease.  Blood cultures were negative.  Chest CT on 03/02/2020 revealed no acute intrathoracic pathology .  There was suggestion of granulomatous disease. Symptomatically,  Plan:   1   Multiple myeloma  She is day 26 of cycle #5 daratumumab.  Counts are secondary to chemotherapy.  Anticipate count recovery in the next few days. 2.  Neutropenia  WBC 1900 with an [place] 500 on 03/02/2020.  WBC 2200 today.  Continue to monitor daily while hospitalized. 3.  Fever  Work-up unrevealing.  Last fever 102.1 on 03/01/2020 at 22:29 then 99.4 on 03/02/2020 at 15:57.  Patient with non-productive cough possibly secondary to sinus symptoms (nasal secretions clearing).  Chest CT negative.  Antibiotics switched to ciprofloxacin today.  Anticipate discharge on oral antibiotics (ciprofloxacin or levaquin) if patient afebrile for 24 hours on oral antibiotics.   Thank you for allowing me to participate in Sarah [name] 's care.  I will follow her closely with you while hospitalized and after discharge in the outpatient department.  Dr Rogue Bussing is on call over the weekend.    Lequita Asal, MD  03/03/2020, 5:27 PM

## 2020-03-04 LAB — GLUCOSE, CAPILLARY
Glucose-Capillary: 106 mg/dL — ABNORMAL HIGH (ref 70–99)
Glucose-Capillary: 96 mg/dL (ref 70–99)
Glucose-Capillary: 101 mg/dL — ABNORMAL HIGH (ref 70–99)
Glucose-Capillary: 106 mg/dL — ABNORMAL HIGH (ref 70–99)

## 2020-03-04 LAB — CBC WITH DIFFERENTIAL/PLATELET
Abs Immature Granulocytes: 0.01 10*3/uL (ref 0.00–0.07)
Basophils Absolute: 0 10*3/uL (ref 0.0–0.1)
Basophils Relative: 1 %
Eosinophils Absolute: 0.2 10*3/uL (ref 0.0–0.5)
Eosinophils Relative: 7 %
HCT: 27.1 % — ABNORMAL LOW (ref 36.0–46.0)
Hemoglobin: 9.5 g/dL — ABNORMAL LOW (ref 12.0–15.0)
Immature Granulocytes: 0 %
Lymphocytes Relative: 38 %
Lymphs Abs: 1 10*3/uL (ref 0.7–4.0)
MCH: 29.9 pg (ref 26.0–34.0)
MCHC: 35.1 g/dL (ref 30.0–36.0)
MCV: 85.2 fL (ref 80.0–100.0)
Monocytes Absolute: 0.4 10*3/uL (ref 0.1–1.0)
Monocytes Relative: 15 %
Neutro Abs: 1 10*3/uL — ABNORMAL LOW (ref 1.7–7.7)
Neutrophils Relative %: 39 %
Platelets: 153 10*3/uL (ref 150–400)
RBC: 3.18 MIL/uL — ABNORMAL LOW (ref 3.87–5.11)
RDW: 13.3 % (ref 11.5–15.5)
WBC: 2.6 10*3/uL — ABNORMAL LOW (ref 4.0–10.5)
nRBC: 0 % (ref 0.0–0.2)

## 2020-03-04 LAB — BASIC METABOLIC PANEL
Anion gap: 6 (ref 5–15)
BUN: 19 mg/dL (ref 8–23)
CO2: 25 mmol/L (ref 22–32)
Calcium: 8.7 mg/dL — ABNORMAL LOW (ref 8.9–10.3)
Chloride: 106 mmol/L (ref 98–111)
Creatinine, Ser: 0.96 mg/dL (ref 0.44–1.00)
GFR calc Af Amer: >60 mL/min (ref >60)
GFR calc non Af Amer: >60 mL/min (ref >60)
Glucose, Bld: 101 mg/dL — ABNORMAL HIGH (ref 70–99)
Potassium: 4.2 mmol/L (ref 3.5–5.1)
Sodium: 137 mmol/L (ref 135–145)

## 2020-03-04 MED ORDER — PROCHLORPERAZINE EDISYLATE 10 MG/2ML IJ SOLN
10.0000 mg | Freq: Once | INTRAMUSCULAR | Status: AC
  Administered 2020-03-04: 10 mg via INTRAVENOUS
  Filled 2020-03-04: qty 2

## 2020-03-04 MED ORDER — ALUM & MAG HYDROXIDE-SIMETH 200-200-20 MG/5ML PO SUSP
30.0000 mL | ORAL | Status: DC | PRN
  Administered 2020-03-04: 30 mL via ORAL
  Filled 2020-03-04: qty 30

## 2020-03-04 NOTE — Progress Notes (Signed)
PROGRESS NOTE    Patient: Sarah [name]                            PCP: Glean Hess, MD                    DOB: 1955/09/07            DOA: 03/01/2020 LFY:[id number]             DOS: 03/04/2020, 11:25 AM   LOS: 2 days   Date of Service: The patient was seen and examined on 03/04/2020  Subjective:   The patient was seen and examined this morning, stable,  T-max 100.4 this a.m.... Otherwise hemodynamically stable No issues overnight  Brief Narrative:   Sarah [name] is a 64 y.o. female w PMH/o Multiple Myeloma for which she is actively being treated with chemotherapy by Dr. Mike Gip. She presents from the cancer center for further evaluation and admission for neutropenic fever. She had labs drawn, blood cultures, urinalysis and urine culture, and chest x-ray. She received a dose of cefepime IV and was sent to the ED for admission.     She was vaccinated for COVID-19 (4/21)  ED Course:  Tmax 102.1 WBC 2.6 given ibuprofen.  Chest x-ray was negative for infiltrate.    Abnormal urine in office, she continues to have a cough.  She was given empiric antibiotics with cefepime and vancomycin for neutropenic fever... Cultures were obtained.  SARS-CoV-2 negative  Assessment & Plan:   Principal Problem:   Neutropenic fever ([place]) Active Problems:   Antineoplastic chemotherapy induced pancytopenia (HCC)   Multiple myeloma not having achieved remission (HCC)   Type II diabetes mellitus with complication (HCC)   Depression, major, single episode, in partial remission (HCC)   Hepatic steatosis   Benign essential HTN   Dyspnea   Malnutrition of moderate degree   Acute lower UTI   Neutropenic fever (HCC) Sarah [name] out sepsis-bacteremia -Ruling out sepsis/bacteremia-in a immunocompromised patient (patient did not meet sepsis criteria on admission) -Hemodynamically stable with exception of temp of 100.4 this a.m.   In a setting of multiple myeloma and is undergoing  chemotherapy with oncology at this time. Last chemotherapy was 2 weeks ago. -We will follow up with cultures -Continue currently initiated antibiotics of cefepime and vancomycin >>> will switch to p.o. ciprofloxacin on 03/03/2020 -Oncology recommending if she remains afebrile for 24 hours may be switched to tomorrow otherwise MD discharged home  Patient has nitrites on urinalysis but she denies any urinary symptoms of frequency, hesitancy or dysuria.   She does have a bladder stimulator secondary to previous incontinence she states. -UA positive for leukocyte esterase, nitrites, many WBCs,  -Blood/urine cultures negative to date  -SARS-CoV-2 negative  Active Problems:   Multiple myeloma not having achieved remission (HCC) -Management per oncology- -Appreciate follow-up and input -Last chemotherapy 2 weeks ago    Type II diabetes mellitus with complication (HCC) -Blood sugar stable -A1c 5.9, -Check in CBG QA CHS, SSI coverage -We will holding home medication of Metformin    Mild  Dyspnea -without respiratory failure-persistent cough -CT of chest was reviewed-negative for intrathoracic acute changes -We will continue to monitor closely, O2 supplement as needed, nebs as needed  Mild epigastric pain -with cough -Likely persisting cough We will continue mucolytic's, cough suppressants   Nutritional status:  Nutrition Problem: Moderate Malnutrition Etiology: chronic illness (multiple myeloma) Signs/Symptoms: moderate fat depletion, mild muscle  depletion, moderate muscle depletion Interventions: Refer to RD note for recommendations     Cultures; Blood Cultures x 2 >> NGT Urine Culture  >>> NGT  Outside urine culture 03/01/2020 growing staph epididymis Sputum Culture >> NGT   Antimicrobials: IV Rocephin, switched to p.o. ciprofloxacin 03/03/2020 Continue home medication of acyclovir Vancomycin/cefepime discontinued 03/03/2020    Consultants: Oncology     ---------------------------------------------------------------------------------------------------------------------------------------------  DVT prophylaxis:  SCD/Compression stockings and Lovenox SQ Code Status:   Code Status: Full Code Family Communication: No family member present at bedside- attempt will be made to update daily The above findings and plan of care has been discussed with patient (and family )  in detail,  they expressed understanding and agreement of above. -Advance care planning has been discussed.   Admission status:    Status is: Inpatient  Remains inpatient appropriate because:Inpatient level of care appropriate due to severity of illness   Dispo: The patient is from: Home              Anticipated d/c is to: Home              Anticipated d/c date is: If remains afebrile next 24 hours will be discharged home              Patient currently is not medically stable to d/c.        Procedures:   No admission procedures for hospital encounter.     Antimicrobials:  Anti-infectives (From admission, onward)   Start     Dose/Rate Route Frequency Ordered Stop   03/03/20 1330  ciprofloxacin (CIPRO) tablet 500 mg        500 mg Oral 2 times daily 03/03/20 1243     03/02/20 1400  vancomycin (VANCOREADY) IVPB 750 mg/150 mL  Status:  Discontinued        750 mg 150 mL/hr over 60 Minutes Intravenous Every 12 hours 03/02/20 0906 03/03/20 1305   03/02/20 1000  acyclovir (ZOVIRAX) 200 MG capsule 400 mg        400 mg Oral 2 times daily 03/02/20 0746     03/02/20 0930  ceFEPIme (MAXIPIME) 2 g in sodium chloride 0.9 % 100 mL IVPB  Status:  Discontinued        2 g 200 mL/hr over 30 Minutes Intravenous Every 8 hours 03/02/20 0846 03/03/20 1243   03/02/20 0930  metroNIDAZOLE (FLAGYL) IVPB 500 mg  Status:  Discontinued        500 mg 100 mL/hr over 60 Minutes Intravenous Every 8 hours 03/02/20 0846 03/03/20 1243   03/01/20 2315  ceFEPIme (MAXIPIME) 2 g in sodium  chloride 0.9 % 100 mL IVPB        2 g 200 mL/hr over 30 Minutes Intravenous  Once 03/01/20 2303 03/02/20 0052   03/01/20 2315  metroNIDAZOLE (FLAGYL) IVPB 500 mg        500 mg 100 mL/hr over 60 Minutes Intravenous  Once 03/01/20 2303 03/02/20 0202   03/01/20 2315  vancomycin (VANCOCIN) IVPB 1000 mg/200 mL premix        1,000 mg 200 mL/hr over 60 Minutes Intravenous  Once 03/01/20 2303 03/02/20 0309       Medication:   acyclovir  400 mg Oral BID   allopurinol  100 mg Oral Daily   aspirin  81 mg Oral Daily   benzonatate  200 mg Oral TID   ciprofloxacin  500 mg Oral BID   enoxaparin (LOVENOX) injection  40 mg Subcutaneous Q24H  fentaNYL  1 patch Transdermal Q72H   FLUoxetine  40 mg Oral Daily   multivitamin with minerals  1 tablet Oral Daily   Ensure Max Protein  11 oz Oral BID BM   sucralfate  1 g Oral BID    sodium chloride, guaiFENesin-dextromethorphan, ibuprofen, promethazine, senna-docusate, tiZANidine, traZODone   Objective:   Vitals:   03/03/20 1920 03/04/20 0100 03/04/20 0812 03/04/20 1124  BP:  (!) 107/58 140/71   Pulse:  (!) 59 74   Resp:  16 18   Temp: 98.7 F (37.1 C) 98 F (36.7 C) 99 F (37.2 C) (!) 100.4 F (38 C)  TempSrc:  Oral Oral Oral  SpO2:  97% 99%   Weight:      Height:        Intake/Output Summary (Last 24 hours) at 03/04/2020 1125 Last data filed at 03/03/2020 2128 Gross per 24 hour  Intake --  Output 1 ml  Net -1 ml   Filed Weights   03/01/20 1511  Weight: 64 kg     Examination:     Physical Exam:   General:  Alert, oriented, cooperative, no distress;   HEENT:  Normocephalic, PERRL, otherwise with in Normal limits   Neuro:  CNII-XII intact. , normal motor and sensation, reflexes intact   Lungs:   Clear to auscultation BL, Respirations unlabored, no wheezes / crackles  Cardio:    S1/S2, RRR, No murmure, No Rubs or Gallops   Abdomen:   Soft, non-tender, bowel sounds active all four quadrants,  no guarding or  peritoneal signs.  Muscular skeletal:  Limited exam - in bed, able to move all 4 extremities, Normal strength,  2+ pulses,  symmetric, No pitting edema  Skin:  Dry, warm to touch, negative for any Rashes, No open wounds  Wounds: Please see nursing documentation-port a catheter present on the chest wall, no signs of erythema edema or infection         ------------------------------------------------------------------------------------------------------------------------------------------    LABs:  CBC Latest Ref Rng & Units 03/04/2020 03/03/2020 03/02/2020  WBC 4.0 - 10.5 K/uL 2.6(L) 2.2(L) 1.9(L)  Hemoglobin 12.0 - 15.0 g/dL 9.5(L) 10.0(L) 9.6(L)  Hematocrit 36 - 46 % 27.1(L) 28.4(L) 28.8(L)  Platelets 150 - 400 K/uL 153 140(L) 138(L)   CMP Latest Ref Rng & Units 03/04/2020 03/03/2020 03/02/2020  Glucose 70 - 99 mg/dL 101(H) 108(H) 103(H)  BUN 8 - 23 mg/dL 19 17 10   Creatinine 0.44 - 1.00 mg/dL 0.96 0.93 0.82  Sodium 135 - 145 mmol/L 137 139 139  Potassium 3.5 - 5.1 mmol/L 4.2 4.1 4.1  Chloride 98 - 111 mmol/L 106 107 108  CO2 22 - 32 mmol/L 25 24 27   Calcium 8.9 - 10.3 mg/dL 8.7(L) 8.8(L) 8.0(L)  Total Protein 6.5 - 8.1 g/dL - - -  Total Bilirubin 0.3 - 1.2 mg/dL - - -  Alkaline Phos 38 - 126 U/L - - -  AST 15 - 41 U/L - - -  ALT 0 - 44 U/L - - -       Micro Results Recent Results (from the past 240 hour(s))  Urine Culture     Status: Abnormal   Collection Time: 03/01/20  1:06 PM   Specimen: Urine, Clean Catch  Result Value Ref Range Status   Specimen Description   Final    URINE, CLEAN CATCH Performed at [facility], [address], [place], [place] [address]    Special Requests   Final    NONE Performed  at [facility], [address] [place], [place] [address]    Culture >=100,000 COLONIES/mL STAPHYLOCOCCUS EPIDERMIDIS (A)  Final   Report Status 03/03/2020 FINAL  Final   Organism ID, Bacteria STAPHYLOCOCCUS EPIDERMIDIS (A)  Final      Susceptibility    Staphylococcus epidermidis - MIC*    CIPROFLOXACIN <=0.5 SENSITIVE Sensitive     GENTAMICIN <=0.5 SENSITIVE Sensitive     NITROFURANTOIN <=16 SENSITIVE Sensitive     OXACILLIN <=0.25 SENSITIVE Sensitive     TETRACYCLINE >=16 RESISTANT Resistant     VANCOMYCIN 1 SENSITIVE Sensitive     TRIMETH/SULFA <=10 SENSITIVE Sensitive     CLINDAMYCIN <=0.25 SENSITIVE Sensitive     RIFAMPIN <=0.5 SENSITIVE Sensitive     Inducible Clindamycin NEGATIVE Sensitive     * >=100,000 COLONIES/mL STAPHYLOCOCCUS EPIDERMIDIS  Culture, blood (Routine X 2) w Reflex to ID Panel     Status: None (Preliminary result)   Collection Time: 03/01/20  1:29 PM   Specimen: BLOOD  Result Value Ref Range Status   Specimen Description   Final    BLOOD RIGHT ARM Performed at [facility], [address], [place], [place] [address]    Special Requests   Final    BOTTLES DRAWN AEROBIC AND ANAEROBIC Blood Culture adequate volume Performed at [facility], [address], [place], [place] [address]    Culture   Final    NO GROWTH 3 DAYS Performed at [facility], [address], [place], [place] [address]    Report Status PENDING  Incomplete  Culture, blood (Routine X 2) w Reflex to ID Panel     Status: None (Preliminary result)   Collection Time: 03/01/20  1:50 PM   Specimen: BLOOD  Result Value Ref Range Status   Specimen Description   Final    BLOOD ARM RIGHT Performed at [facility], [address] [place], [place] [address]    Special Requests   Final    BOTTLES DRAWN AEROBIC AND ANAEROBIC Blood Culture adequate volume Performed at [facility], [address] [place], [place] [address]    Culture   Final    NO GROWTH 3 DAYS Performed at [facility], [address], [place], [place] [address]    Report Status PENDING  Incomplete  SARS Coronavirus 2 by RT PCR (hospital order, performed in [place] hospital lab) Nasopharyngeal Nasopharyngeal Swab     Status:  None   Collection Time: 03/01/20 11:33 PM   Specimen: Nasopharyngeal Swab  Result Value Ref Range Status   SARS Coronavirus 2 NEGATIVE NEGATIVE Final    Comment: (NOTE) SARS-CoV-2 target nucleic acids are NOT DETECTED.  The SARS-CoV-2 RNA is generally detectable in upper and lower respiratory specimens during the acute phase of infection. The lowest concentration of SARS-CoV-2 viral copies this assay can detect is 250 copies / mL. A negative result does not preclude SARS-CoV-2 infection and should not be used as the sole basis for treatment or other patient management decisions.  A negative result may occur with improper specimen collection / handling, submission of specimen other than nasopharyngeal swab, presence of viral mutation(s) within the areas targeted by this assay, and inadequate number of viral copies (<250 copies / mL). A negative result must be combined with clinical observations, patient history, and epidemiological information.  Fact Sheet for Patients:   StrictlyIdeas.no  Fact Sheet for Healthcare Providers: BankingDealers.co.za  This test is not yet approved or  cleared by the  [place] Architectural technologist and has been authorized for detection and/or diagnosis of SARS-CoV-2 by FDA under an Print production planner (EUA).  This EUA will remain in effect (meaning this test can be used) for the duration of the COVID-19 declaration under Section 564(b)(1) of the Act, 21 U.S.C. section 360bbb-3(b)(1), unless the authorization is terminated or revoked sooner.  Performed at [facility], [address] [place], [place] [address]     Radiology Reports DG Chest 2 View  Result Date: 03/01/2020 CLINICAL DATA:  Cough and congestion EXAM: CHEST - 2 VIEW COMPARISON:  11/15/2019, CT 05/21/2016 FINDINGS: Right-sided central venous port tip over the SVC. Chronic bronchitic changes. No focal opacity or pleural effusion. Normal  cardiomediastinal silhouette. No pneumothorax. IMPRESSION: No active cardiopulmonary disease. Electronically Signed   By: Donavan Foil M.D.   On: 03/01/2020 23:18   CT CHEST W CONTRAST  Result Date: 03/02/2020 CLINICAL DATA:  Fever of unknown origin EXAM: CT CHEST WITH CONTRAST TECHNIQUE: Multidetector CT imaging of the chest was performed during intravenous contrast administration. CONTRAST:  53m OMNIPAQUE IOHEXOL 300 MG/ML  SOLN COMPARISON:  None. FINDINGS: Cardiovascular: There is mild aortic valve calcifications seen. The heart size is normal. There is no pericardial thickening or effusion. Mediastinum/Nodes: There are no enlarged mediastinal, hilar or axillary lymph nodes.Scattered calcified prevascular and mediastinal lymph nodes are noted. A small amount of debris is seen within the esophagus. The bronchi and thyroid lobe are unremarkable. Lungs/Pleura: Tiny subpleural bleb seen at both lung apices. There are no areas consolidation. No suspicious pulmonary nodules. There is no pleural effusion. Upper abdomen: The visualized portion of the upper abdomen is unremarkable. Musculoskeletal/Chest wall: There is no chest wall mass or suspicious osseous finding. No acute osseous abnormality IMPRESSION: No acute intrathoracic pathology. Findings which could be suggestive of prior granulomatous disease. Electronically Signed   By: BPrudencio PairM.D.   On: 03/02/2020 00:43    SIGNED: SDeatra James MD, FACP, FHM. [facility],  Pager (please use [email or web address] to page/text)  If 7PM-7AM, please contact night-coverage [email or web address], Password [facility]9/09/2019, 11:25 AM

## 2020-03-05 ENCOUNTER — Other Ambulatory Visit: Payer: Self-pay | Admitting: Internal Medicine

## 2020-03-05 DIAGNOSIS — D709 Neutropenia, unspecified: Secondary | ICD-10-CM

## 2020-03-05 DIAGNOSIS — R5081 Fever presenting with conditions classified elsewhere: Secondary | ICD-10-CM

## 2020-03-05 DIAGNOSIS — R059 Cough, unspecified: Secondary | ICD-10-CM

## 2020-03-05 LAB — CBC WITH DIFFERENTIAL/PLATELET
Abs Immature Granulocytes: 0.03 10*3/uL (ref 0.00–0.07)
Basophils Absolute: 0 10*3/uL (ref 0.0–0.1)
Basophils Relative: 1 %
Eosinophils Absolute: 0.1 10*3/uL (ref 0.0–0.5)
Eosinophils Relative: 2 %
HCT: 30.6 % — ABNORMAL LOW (ref 36.0–46.0)
Hemoglobin: 10.9 g/dL — ABNORMAL LOW (ref 12.0–15.0)
Immature Granulocytes: 1 %
Lymphocytes Relative: 36 %
Lymphs Abs: 1.3 10*3/uL (ref 0.7–4.0)
MCH: 30.1 pg (ref 26.0–34.0)
MCHC: 35.6 g/dL (ref 30.0–36.0)
MCV: 84.5 fL (ref 80.0–100.0)
Monocytes Absolute: 0.5 10*3/uL (ref 0.1–1.0)
Monocytes Relative: 13 %
Neutro Abs: 1.6 10*3/uL — ABNORMAL LOW (ref 1.7–7.7)
Neutrophils Relative %: 47 %
Platelets: 181 10*3/uL (ref 150–400)
RBC: 3.62 MIL/uL — ABNORMAL LOW (ref 3.87–5.11)
RDW: 13.2 % (ref 11.5–15.5)
WBC: 3.5 10*3/uL — ABNORMAL LOW (ref 4.0–10.5)
nRBC: 0 % (ref 0.0–0.2)

## 2020-03-05 LAB — GLUCOSE, CAPILLARY
Glucose-Capillary: 97 mg/dL (ref 70–99)
Glucose-Capillary: 102 mg/dL — ABNORMAL HIGH (ref 70–99)

## 2020-03-05 LAB — BASIC METABOLIC PANEL
Anion gap: 9 (ref 5–15)
BUN: 25 mg/dL — ABNORMAL HIGH (ref 8–23)
CO2: 25 mmol/L (ref 22–32)
Calcium: 9.5 mg/dL (ref 8.9–10.3)
Chloride: 102 mmol/L (ref 98–111)
Creatinine, Ser: 0.92 mg/dL (ref 0.44–1.00)
GFR calc Af Amer: >60 mL/min (ref >60)
GFR calc non Af Amer: >60 mL/min (ref >60)
Glucose, Bld: 114 mg/dL — ABNORMAL HIGH (ref 70–99)
Potassium: 3.8 mmol/L (ref 3.5–5.1)
Sodium: 136 mmol/L (ref 135–145)

## 2020-03-05 MED ORDER — BENZONATATE 200 MG PO CAPS: 200.0000 mg | ORAL_CAPSULE | Freq: Three times a day (TID) | ORAL | 0 refills | Status: AC

## 2020-03-05 MED ORDER — CHLORHEXIDINE GLUCONATE CLOTH 2 % EX PADS: 6.0000 | MEDICATED_PAD | Freq: Every day | CUTANEOUS | Status: DC

## 2020-03-05 MED ORDER — PROMETHAZINE-DM 6.25-15 MG/5ML PO SYRP: 5.0000 mL | ORAL_SOLUTION | Freq: Three times a day (TID) | ORAL | 0 refills | Status: DC | PRN

## 2020-03-05 MED ORDER — CIPROFLOXACIN HCL 500 MG PO TABS: 500.0000 mg | ORAL_TABLET | Freq: Two times a day (BID) | ORAL | 0 refills | Status: AC

## 2020-03-05 NOTE — Assessment & Plan Note (Addendum)
#  64 year old female patient with a history of multiple myeloma-currently on Pom Dex Dara-is currently in the hospital for neutropenic fever  #Neutropenic fever-T-max 100.4 last 24 hours; urine culture positive for staph epidermidis; CT chest negative for acute process.  ANC today is 1.6/on ciprofloxacin.  #Relapsed multiple myeloma-currently on Pom Dex Dara-resume once acute issues resolved.  #Cough-recommend Phenergan-dextromethorphan [as per patient request]; continue prescription to pharmacy.  #Reflux-recommend Prilosec/home medication.  #Disposition; patient clinically stable from oncology standpoint. Patient needs follow-up with Dr. Mike Gip next week. Discussed with Dr. Roger Shelter.

## 2020-03-05 NOTE — Progress Notes (Signed)
Sarah [name]   DOB:08/04/1955   MR#:[id number]    Subjective: Patient resting in the bed comfortably. Complains of mild to moderate cough. However denies any fevers or chills. T-max 100.4; 24 hours ago. Complains of reflux-like symptoms. Patient not on PPI. However feel strong enough to go home.  Objective:  Vitals:   03/04/20 2339 03/05/20 0754  BP: 136/79 140/80  Pulse: 90 71  Resp: 17 18  Temp: 99.1 F (37.3 C) 98.6 F (37 C)  SpO2: 95% 98%    No intake or output data in the 24 hours ending 03/05/20 1259  Physical Exam Vitals and nursing note reviewed.  Constitutional:      Comments: Thin built Caucasian female patient resting in the bed comfortably. No acute distress.  HENT:     Head: Normocephalic and atraumatic.     Mouth/Throat:     Pharynx: No oropharyngeal exudate.  Eyes:     Pupils: Pupils are equal, round, and reactive to light.  Cardiovascular:     Rate and Rhythm: Normal rate and regular rhythm.  Pulmonary:     Effort: Pulmonary effort is normal. No respiratory distress.     Breath sounds: Normal breath sounds. No wheezing.  Abdominal:     General: Bowel sounds are normal. There is no distension.     Palpations: Abdomen is soft. There is no mass.     Tenderness: There is no abdominal tenderness. There is no guarding or rebound.  Musculoskeletal:        General: No tenderness. Normal range of motion.     Cervical back: Normal range of motion and neck supple.  Skin:    General: Skin is warm.  Neurological:     Mental Status: She is alert and oriented to person, place, and time.  Psychiatric:        Mood and Affect: Affect normal.      Labs:  Lab Results  Component Value Date   WBC 3.5 (L) 03/05/2020   HGB 10.9 (L) 03/05/2020   HCT 30.6 (L) 03/05/2020   MCV 84.5 03/05/2020   PLT 181 03/05/2020   NEUTROABS 1.6 (L) 03/05/2020    Lab Results  Component Value Date   NA 136 03/05/2020   K 3.8 03/05/2020   CL 102 03/05/2020   CO2 25 03/05/2020     Studies:  No results found.  Multiple myeloma not having achieved remission (HCC) #64-year-old female patient with a history of multiple myeloma-currently on Pom Dex Dara-is currently in the hospital for neutropenic fever  #Neutropenic fever-T-max 100.4 last 24 hours; urine culture positive for staph epidermidis; CT chest negative for acute process.  ANC today is 1.6/on ciprofloxacin.  #Relapsed multiple myeloma-currently on Pom Dex Dara-resume once acute issues resolved.  #Cough-recommend Phenergan-dextromethorphan [as per patient request]; continue prescription to pharmacy.  #Reflux-recommend Prilosec/home medication.  #Disposition; patient clinically stable from oncology standpoint. Patient needs follow-up with Dr. Corcoran next week. Discussed with Dr. Shahmehdi.  Govinda R Brahmanday, MD 03/05/2020  12:59 PM   

## 2020-03-05 NOTE — Progress Notes (Signed)
Patient is being discharged home this afternoon. DC & Rx instructions given and patient acknowledged understanding. IV removed. Right port left in place.

## 2020-03-05 NOTE — Discharge Summary (Signed)
Physician Discharge Summary [facility]    Patient: Sarah [name]                   Admit date: 03/01/2020   DOB: 1955-07-09             Discharge date:03/05/2020/9:47 AM JSE:[id number]                          PCP: Glean Hess, MD  Disposition: HOME   Recommendations for Outpatient Follow-up:   . Follow up: in 1 week  Discharge Condition: Stable   Code Status:   Code Status: Full Code  Diet recommendation: Regular healthy diet   Discharge Diagnoses:    Principal Problem:   Neutropenic fever ([place]) Active Problems:   Antineoplastic chemotherapy induced pancytopenia ([place])   Multiple myeloma not having achieved remission ([place])   Type II diabetes mellitus with complication (HCC)   Depression, major, single episode, in partial remission (HCC)   Hepatic steatosis   Benign essential HTN   Dyspnea   Malnutrition of moderate degree   Acute lower UTI   History of Present Illness/ Hospital Course Sarah [name] Summary:    Sarah [name] a 64 y.o.femalew PMH/o Multiple Myeloma for which she is actively being treated with chemotherapy by Dr. Mike Gip. She presents from the cancer center for further evaluation and admission for neutropenic fever. [facility] labs drawn, blood cultures, urinalysis and urine culture, and chest x-ray. She received a dose of cefepime IV and was sent to the ED for admission.   She was vaccinated for COVID-19 (4/21)  ED Course: Tmax 102.1 WBC 2.6 given ibuprofen. Chest x-ray was negative for infiltrate.   Abnormal urine in office, she continues to have a cough. She was given empiric antibiotics with cefepime and vancomycin for neutropenic fever... Cultures were obtained.  SARS-CoV-2 negative  Neutropenic fever ([place]) [name] out sepsis-bacteremia -Ruling out sepsis/bacteremia-in a immunocompromised patient (patient did not meet sepsis criteria on admission) -Hemodynamically stable with exception of temp of 100.4  yesterday... afibrial in past 24 hrs     In a setting of multiple myeloma and is undergoing chemotherapy with oncology at this time. Last chemotherapy was 2 weeks ago. -We will follow up with cultures -Continue currently initiated antibiotics of cefepime and vancomycin >>> will switch to p.o. ciprofloxacin on 03/03/2020--- for total of 10 days of abx  -Oncology recommending - remains afebrile for last 24 hours --- will  discharged home  Patient has nitrites on urinalysis but she denies any urinary symptoms of frequency, hesitancy or dysuria.  She does have a bladder stimulator secondary to previous incontinence she states. -UA positive for leukocyte esterase, nitrites, many WBCs,  -Blood/urine cultures negative to date  -SARS-CoV-2 negative  Active Problems: Multiple myeloma not having achieved remission ([place]) -Management per oncology- -Appreciate follow-up and input -Last chemotherapy 2 weeks ago  Type II diabetes mellitus with complication (HCC) -Blood sugar stable -A1c 5.9, -Check in CBG QA CHS, SSI coverage -Resum  home medication of Metformin    MildDyspnea -without respiratory failure-persistent cough -CT of chest was reviewed-negative for intrathoracic acute changes -We will continue to monitor closely, O2 supplement as needed, nebs as needed  Mild epigastric pain -with cough -Likely persisting cough We will continue mucolytic's, cough suppressants   Nutritional status:  Nutrition Problem: Moderate Malnutrition Etiology: chronic illness (multiple myeloma) Signs/Symptoms: moderate fat depletion, mild muscle depletion, moderate muscle depletion Interventions: Refer to RD note for recommendations  Cultures; Blood Cultures x 2 >> NGT Urine Culture  >>> NGT  Outside urine culture 03/01/2020 growing staph epididymis Sputum Culture >> NGT   Antimicrobials: IV Rocephin, switched to p.o. ciprofloxacin 03/03/2020---- Continue home medication of  acyclovir Vancomycin/cefepime discontinued 03/03/2020    Consultants: Oncology       Nutritional status:  Nutrition Problem: Moderate Malnutrition Etiology: chronic illness (multiple myeloma) Signs/Symptoms: moderate fat depletion, mild muscle depletion, moderate muscle depletion Interventions: Refer to RD note for recommendations   Discharge Instructions:   Discharge Instructions    Activity as tolerated - No restrictions   Complete by: As directed    Diet - low sodium heart healthy   Complete by: As directed    Discharge instructions   Complete by: As directed    F/up with Oncologist next week   Increase activity slowly   Complete by: As directed        Medication List    STOP taking these medications   furosemide 20 MG tablet Commonly known as: LASIX   hydrOXYzine 10 MG tablet Commonly known as: ATARAX/VISTARIL     TAKE these medications   acetaminophen 500 MG tablet Commonly known as: TYLENOL Take 500 mg by mouth every 6 (six) hours as needed.   acyclovir 400 MG tablet Commonly known as: ZOVIRAX Take 1 tablet (400 mg total) by mouth 2 (two) times daily.   allopurinol 100 MG tablet Commonly known as: ZYLOPRIM TAKE 1 TABLET(100 MG) BY MOUTH DAILY as needed   aspirin 81 MG chewable tablet Chew 1 tablet (81 mg total) by mouth daily.   benzonatate 200 MG capsule Commonly known as: TESSALON Take 1 capsule (200 mg total) by mouth 3 (three) times daily for 5 days.   ciprofloxacin 500 MG tablet Commonly known as: CIPRO Take 1 tablet (500 mg total) by mouth 2 (two) times daily for 5 days.   dexamethasone 4 MG tablet Commonly known as: DECADRON Take 5 tablets ($RemoveBe'20mg'SArhSKOIp$ ) on days 2, 16 of cycle 3,4, 5 and 6 Take 5 tablets ($RemoveBe'20mg'mSHnYDhlk$ ) on days 2 of cycles 7 and beyond   diclofenac sodium 1 % Gel Commonly known as: VOLTAREN Apply 2 g topically 4 (four) times daily.   diphenhydrAMINE 50 MG capsule Commonly known as: BENADRYL Take 50 mg by mouth as needed.     diphenoxylate-atropine 2.5-0.025 MG tablet Commonly known as: LOMOTIL Take 2 tablets by mouth daily as needed for diarrhea or loose stools.   fentaNYL 25 MCG/HR Commonly known as: DURAGESIC Place 25 mcg onto the skin every 3 (three) days.   fexofenadine 180 MG tablet Commonly known as: ALLEGRA TAKE 1 TABLET(180 MG) BY MOUTH DAILY   FLUoxetine 40 MG capsule Commonly known as: PROZAC TAKE 1 CAPSULE EVERY DAY   fluticasone 50 MCG/ACT nasal spray Commonly known as: FLONASE Place 2 sprays into both nostrils daily.   Hair/Skin/Nails Tabs Take 1 tablet by mouth daily.   lactulose 10 GM/15ML solution Commonly known as: CHRONULAC Take 20 g by mouth daily as needed.   lidocaine-prilocaine cream Commonly known as: EMLA APPLY TOPICALLY AS NEEDED.   lisinopril 10 MG tablet Commonly known as: ZESTRIL Take 10 mg by mouth once.   metFORMIN 500 MG 24 hr tablet Commonly known as: GLUCOPHAGE-XR TAKE 1 TABLET (500 MG TOTAL) BY MOUTH DAILY WITH BREAKFAST.   montelukast 10 MG tablet Commonly known as: Singulair Take 1 tablet by mouth on the day before treatment and for 2 days after treatment.   multivitamin tablet Take 1 tablet  by mouth daily.   Narcan 4 MG/0.1ML Liqd nasal spray kit Generic drug: naloxone 1 spray.   omeprazole 40 MG capsule Commonly known as: PRILOSEC TAKE 1 CAPSULE EVERY DAY   ONE TOUCH ULTRA TEST test strip Generic drug: glucose blood   pomalidomide 3 MG capsule Commonly known as: Pomalyst TAKE 1 CAPSULE BY MOUTH EVERY DAY   Rem's # [id number] Date obtained: 01/05/2020   promethazine 25 MG tablet Commonly known as: PHENERGAN Take 1 tablet (25 mg total) by mouth every 6 (six) hours as needed for nausea or vomiting.   promethazine-dextromethorphan 6.25-15 MG/5ML syrup Commonly known as: PROMETHAZINE-DM Take 5 mLs by mouth 4 (four) times daily as needed for cough.   tiZANidine 4 MG tablet Commonly known as: ZANAFLEX Take 1 tablet (4 mg total) by  mouth every 8 (eight) hours as needed for muscle spasms.   traZODone 100 MG tablet Commonly known as: DESYREL TAKE 1 TABLET AT BEDTIME  FOR  SLEEP       Allergies  Allergen Reactions  . Oxycodone-Acetaminophen Anaphylaxis    Swelling and rash  . Celebrex [Celecoxib] Diarrhea  . Codeine   . Plerixafor     In 2016 during ASCT collection patient developed fever to 103.87F and required hospitalization  . Benadryl [Diphenhydramine] Palpitations  . Morphine Itching and Rash  . Ondansetron Diarrhea  . Tylenol [Acetaminophen] Itching and Rash     Procedures /Studies:   DG Chest 2 View  Result Date: 03/01/2020 CLINICAL DATA:  Cough and congestion EXAM: CHEST - 2 VIEW COMPARISON:  11/15/2019, CT 05/21/2016 FINDINGS: Right-sided central venous port tip over the SVC. Chronic bronchitic changes. No focal opacity or pleural effusion. Normal cardiomediastinal silhouette. No pneumothorax. IMPRESSION: No active cardiopulmonary disease. Electronically Signed   By: Donavan Foil M.D.   On: 03/01/2020 23:18   CT CHEST W CONTRAST  Result Date: 03/02/2020 CLINICAL DATA:  Fever of unknown origin EXAM: CT CHEST WITH CONTRAST TECHNIQUE: Multidetector CT imaging of the chest was performed during intravenous contrast administration. CONTRAST:  96m OMNIPAQUE IOHEXOL 300 MG/ML  SOLN COMPARISON:  None. FINDINGS: Cardiovascular: There is mild aortic valve calcifications seen. The heart size is normal. There is no pericardial thickening or effusion. Mediastinum/Nodes: There are no enlarged mediastinal, hilar or axillary lymph nodes.Scattered calcified prevascular and mediastinal lymph nodes are noted. A small amount of debris is seen within the esophagus. The bronchi and thyroid lobe are unremarkable. Lungs/Pleura: Tiny subpleural bleb seen at both lung apices. There are no areas consolidation. No suspicious pulmonary nodules. There is no pleural effusion. Upper abdomen: The visualized portion of the upper abdomen is  unremarkable. Musculoskeletal/Chest wall: There is no chest wall mass or suspicious osseous finding. No acute osseous abnormality IMPRESSION: No acute intrathoracic pathology. Findings which could be suggestive of prior granulomatous disease. Electronically Signed   By: BPrudencio PairM.D.   On: 03/02/2020 00:43    Subjective:   Patient was seen and examined 03/05/2020, 9:47 AM Patient stable today. No acute distress.  No issues overnight Stable for discharge.  Discharge Exam:    Vitals:   03/04/20 1124 03/04/20 1530 03/04/20 2339 03/05/20 0754  BP:  (!) 151/80 136/79 140/80  Pulse:  68 90 71  Resp:  _0 Temp: (!) 100.4 F (38 C) 99.9 F (37.7 C) 99.1 F (37.3 C) 98.6 F (37 C)  TempSrc: Oral Oral Oral Oral  SpO2:  98% 95% 98%  Weight:      Height:  General: Pt lying comfortably in bed & appears in no obvious distress. Cardiovascular: S1 & S2 heard, RRR, S1/S2 +. No murmurs, rubs, gallops or clicks. No JVD or pedal edema. Respiratory: Clear to auscultation without wheezing, rhonchi or crackles. No increased work of breathing. Abdominal:  Non-distended, non-tender & soft. No organomegaly or masses appreciated. Normal bowel sounds heard. CNS: Alert and oriented. No focal deficits. Extremities: no edema, no cyanosis    The results of significant diagnostics from this hospitalization (including imaging, microbiology, ancillary and laboratory) are listed below for reference.      Microbiology:   Recent Results (from the past 240 hour(s))  Urine Culture     Status: Abnormal   Collection Time: 03/01/20  1:06 PM   Specimen: Urine, Clean Catch  Result Value Ref Range Status   Specimen Description   Final    URINE, CLEAN CATCH Performed at [facility], [address], [place], [place] [address]    Special Requests   Final    NONE Performed at [facility], [address] [place], [place] [address]    Culture >=100,000 COLONIES/mL STAPHYLOCOCCUS  EPIDERMIDIS (A)  Final   Report Status 03/03/2020 FINAL  Final   Organism ID, Bacteria STAPHYLOCOCCUS EPIDERMIDIS (A)  Final      Susceptibility   Staphylococcus epidermidis - MIC*    CIPROFLOXACIN <=0.5 SENSITIVE Sensitive     GENTAMICIN <=0.5 SENSITIVE Sensitive     NITROFURANTOIN <=16 SENSITIVE Sensitive     OXACILLIN <=0.25 SENSITIVE Sensitive     TETRACYCLINE >=16 RESISTANT Resistant     VANCOMYCIN 1 SENSITIVE Sensitive     TRIMETH/SULFA <=10 SENSITIVE Sensitive     CLINDAMYCIN <=0.25 SENSITIVE Sensitive     RIFAMPIN <=0.5 SENSITIVE Sensitive     Inducible Clindamycin NEGATIVE Sensitive     * >=100,000 COLONIES/mL STAPHYLOCOCCUS EPIDERMIDIS  Culture, blood (Routine X 2) w Reflex to ID Panel     Status: None (Preliminary result)   Collection Time: 03/01/20  1:29 PM   Specimen: BLOOD  Result Value Ref Range Status   Specimen Description   Final    BLOOD RIGHT ARM Performed at [facility], [address], [place], [place] [address]    Special Requests   Final    BOTTLES DRAWN AEROBIC AND ANAEROBIC Blood Culture adequate volume Performed at [facility], [address] [place], [place] [address]    Culture   Final    NO GROWTH 4 DAYS Performed at [facility], [address], [place], [place] [address]    Report Status PENDING  Incomplete  Culture, blood (Routine X 2) w Reflex to ID Panel     Status: None (Preliminary result)   Collection Time: 03/01/20  1:50 PM   Specimen: BLOOD  Result Value Ref Range Status   Specimen Description   Final    BLOOD ARM RIGHT Performed at [facility], [address], [place], [place] [address]    Special Requests   Final    BOTTLES DRAWN AEROBIC AND ANAEROBIC Blood Culture adequate volume Performed at [facility], [address] [place], [place] [address]    Culture   Final    NO GROWTH 4 DAYS Performed at [facility], [address] [place], [place] [address]    Report Status  PENDING  Incomplete  SARS Coronavirus 2 by RT PCR (hospital order, performed in [place] hospital lab) Nasopharyngeal Nasopharyngeal Swab     Status: None  Collection Time: 03/01/20 11:33 PM   Specimen: Nasopharyngeal Swab  Result Value Ref Range Status   SARS Coronavirus 2 NEGATIVE NEGATIVE Final    Comment: (NOTE) SARS-CoV-2 target nucleic acids are NOT DETECTED.  The SARS-CoV-2 RNA is generally detectable in upper and lower respiratory specimens during the acute phase of infection. The lowest concentration of SARS-CoV-2 viral copies this assay can detect is 250 copies / mL. A negative result does not preclude SARS-CoV-2 infection and should not be used as the sole basis for treatment or other patient management decisions.  A negative result may occur with improper specimen collection / handling, submission of specimen other than nasopharyngeal swab, presence of viral mutation(s) within the areas targeted by this assay, and inadequate number of viral copies (<250 copies / mL). A negative result must be combined with clinical observations, patient history, and epidemiological information.  Fact Sheet for Patients:   StrictlyIdeas.no  Fact Sheet for Healthcare Providers: BankingDealers.co.za  This test is not yet approved or  cleared by the [place] FDA and has been authorized for detection and/or diagnosis of SARS-CoV-2 by FDA under an Emergency Use Authorization (EUA).  This EUA will remain in effect (meaning this test can be used) for the duration of the COVID-19 declaration under Section 564(b)(1) of the Act, 21 U.S.C. section 360bbb-3(b)(1), unless the authorization is terminated or revoked sooner.  Performed at [facility], [address] [place], [place] [address]      Labs:   CBC: Recent Labs  Lab 03/01/20 1141 03/01/20 1141 03/01/20 1634 03/02/20 0948 03/03/20 0526 03/04/20 0755  03/05/20 0441  WBC 2.4*   < > 2.6* 1.9* 2.2* 2.6* 3.5*  NEUTROABS 0.7*  --  0.8* 0.5*  --  1.0* 1.6*  HGB 10.4*   < > 10.2* 9.6* 10.0* 9.5* 10.9*  HCT 30.5*   < > 31.1* 28.8* 28.4* 27.1* 30.6*  MCV 87.4   < > 90.1 90.0 86.3 85.2 84.5  PLT 150   < > 157 138* 140* 153 181   < > = values in this interval not displayed.   Basic Metabolic Panel: Recent Labs  Lab 02/28/20 0812 03/01/20 1141 03/01/20 1634 03/02/20 0551 03/03/20 0526 03/04/20 0755 03/05/20 0441  NA  --    < > 137 139 139 137 136  K  --    < > 4.3 4.1 4.1 4.2 3.8  CL  --    < > 105 108 107 106 102  CO2  --    < > _0 GLUCOSE  --    < > 112* 103* 108* 101* 114*  BUN  --    < > _1 25*  CREATININE  --    < > 0.88 0.82 0.93 0.96 0.92  CALCIUM  --    < > 8.5* 8.0* 8.8* 8.7* 9.5  MG 1.3*  --   --  1.4*  --   --   --    < > = values in this interval not displayed.   Liver Function Tests: Recent Labs  Lab 03/01/20 1141 03/01/20 1634  AST 26 25  ALT 23 23  ALKPHOS 85 74  BILITOT 0.2* 0.4  PROT 7.1 6.8  ALBUMIN 4.2 4.0   BNP (last 3 results) No results for input(s): BNP in the last 8760 hours. Cardiac Enzymes: No results for input(s): CKTOTAL, CKMB, CKMBINDEX, TROPONINI in the last 168 hours. CBG: Recent Labs  Lab 03/04/20 [phone]  03/04/20 1146 03/04/20 1649 03/04/20 2116 03/05/20 0753  GLUCAP 106* 96 101* 106* 97   Hgb A1c No results for input(s): HGBA1C in the last 72 hours. Lipid Profile No results for input(s): CHOL, HDL, LDLCALC, TRIG, CHOLHDL, LDLDIRECT in the last 72 hours. Thyroid function studies No results for input(s): TSH, T4TOTAL, T3FREE, THYROIDAB in the last 72 hours.  Invalid input(s): FREET3 Anemia work up No results for input(s): VITAMINB12, FOLATE, FERRITIN, TIBC, IRON, RETICCTPCT in the last 72 hours. Urinalysis    Component Value Date/Time   COLORURINE YELLOW (A) 03/01/2020 1306   APPEARANCEUR CLOUDY (A) 03/01/2020 1306   APPEARANCEUR Clear 07/13/2018 0835    LABSPEC 1.015 03/01/2020 1306   PHURINE 6.0 03/01/2020 1306   GLUCOSEU NEGATIVE 03/01/2020 1306   HGBUR NEGATIVE 03/01/2020 1306   BILIRUBINUR NEGATIVE 03/01/2020 1306   BILIRUBINUR neg 11/25/2018 1021   BILIRUBINUR Negative 07/13/2018 0835   KETONESUR NEGATIVE 03/01/2020 1306   PROTEINUR 30 (A) 03/01/2020 1306   UROBILINOGEN 0.2 11/25/2018 1021   NITRITE POSITIVE (A) 03/01/2020 1306   LEUKOCYTESUR TRACE (A) 03/01/2020 1306         Time coordinating discharge: Over 45 minutes  SIGNED: Deatra James, MD, FACP, [facility]. [facility],  Please use [email or web address] to Page If 7PM-7AM, please contact night-coverage [email or web address] [email or web address] [facility] 03/05/2020, 9:47 AM

## 2020-03-06 ENCOUNTER — Other Ambulatory Visit: Payer: Self-pay | Admitting: Hematology and Oncology

## 2020-03-06 DIAGNOSIS — C9000 Multiple myeloma not having achieved remission: Secondary | ICD-10-CM

## 2020-03-06 LAB — CULTURE, BLOOD (ROUTINE X 2)
Culture: NO GROWTH
Culture: NO GROWTH
Special Requests: ADEQUATE
Special Requests: ADEQUATE

## 2020-03-06 NOTE — Progress Notes (Signed)
[facility]  [address], [address] [place], [place] [address] Phone: [phone]  Fax: [phone]   Clinic Day:  03/07/2020   Referring physician: Glean Hess, MD  Chief Complaint: Sarah [name] is a 64 y.o. 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   HPI: The patient was last seen in the medical oncology clinic on 02/21/2020. At that time, she was feeling better.  She denied any fevers.  She denied any bone pain.  Exam was stable. Hematocrit was 33.0, hemoglobin 11.2, MCV 88.2, platelets 100,000, WBC 4,900. Creatinine was 1.07 (CrCl 55 ml/min). Magnesium was 1.1. She received day 15 daratumumab and 6 g IV Magnesium.  She received Zometa on 02/24/2020.  The patient saw Faythe Casa, NP in the symptom management clinic on 03/01/2020. She reported a cough and fever. Temperature was 100.9. Blood culture was negative. Urinalysis suggested a UTI. Urine culture revealed staphylococcus epidermidis. She received Cefepime and was admitted to the hospital.  She was admitted to [facility] from 03/01/2020 - 03/05/2020. CXR revealed no active cardiopulmonary disease. Chest CT with contrast revealed no acute intrathoracic pathology. There were findings which could be suggestive of prior granulomatous disease. She was treated with Cefepime and Vancomycin, then switched to ciprofloxacin on 03/03/2020.   CBC and CMP followed: 02/25/2020: Hematocrit 34.4, hemoglobin 11.8, MCV 89.9, platelets 118,000, WBC 3,400 (ANC 2,100). Creatinine was 0.97. 02/28/2020: Hematocrit 31.9, hemoglobin 10.6, MCV 87.9, platelets 122,000, WBC 3,300 (ANC 1,500). 03/01/2020: Hematocrit 30.5, hemoglobin 10.4, MCV 87.4, platelets 150,000, WBC 2,400 (ANC    700). Creatinine 1.03 (CrCl 58 ml/min). Calcium 8.3. Total bilirubin 0.2. Lactic acid 0.8. 03/05/2020: Hematocrit  30.6, hemoglobin 10.9, MCV 84.5, platelets 181,000, WBC 3,500 (ANC 1,600).  The patient was seen by Dr Shari Prows at [facility] on 02/25/2020.  She was felt to have a sustained very good partial response.  Recommendation was to continue her current dose and schedule of chemotherapy.  Her functional status had improved significantly.  Zometa remained on hold secondary to pending dental work.   SPEP with immunofixation revealed a monoclonal IgG kappa (new clone or presence of a therapeutic monoclonal antibody).  Kappa free light chains were 0.48 mg/dL, lambda free light chains 1.02, and ratio of 0.47. LDH was 156.  IgG was 600, IgM 8, and IgA 37.5.  Magnesium was 1.3 on 02/28/2020.  She received 6 g IV Magnesium on 02/24/2020 and 02/28/2020.  During the interim, she has been "ok". She has developed a cough, abdominal pain, and congestion. The coughing has made her nausea worse. She states that she choked on a piece of steak 2 weeks ago, which made it difficult for her to swallow. She had an episode of vomiting on 03/03/2020. She has not had a bowel movement since 03/03/2020.  She denies any urinary symptoms. She feels less drained than she did after the previous 2 times she was in the hospital. She states that her sisters are looking out for her right now. One of her sisters has developed the same cough. The patient has not had a fever since she left the hospital.   Past Medical History:  Diagnosis Date  . Abnormal stress test 02/14/2016   Overview:  Added automatically from request for surgery [id number]  . Anemia   . Anxiety   . Arthritis   . Bicuspid aortic valve   . CHF (congestive heart  failure) ([place])   . CKD (chronic kidney disease) stage 3, GFR 30-59 ml/min   . Depression   . Diabetes mellitus ([place])   . Dizziness   . Fatty liver   . Frequent falls   . GERD (gastroesophageal reflux disease)   . Gout   . Heart murmur   . History of blood transfusion   . History of bone marrow transplant ([place])    . History of uterine fibroid   . Hx of cardiac catheterization 06/05/2016   Overview:  Normal coronaries 2017  . Hypertension   . Hypomagnesemia   . Multiple myeloma ([place])   . Personal history of chemotherapy   . Renal cyst     Past Surgical History:  Procedure Laterality Date  . ABDOMINAL HYSTERECTOMY    . 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 CARDIAC ELECTROPHYSIOLOGY MAPPING AND ABLATION    . CARPAL TUNNEL RELEASE Bilateral   . CHOLECYSTECTOMY  2008  . COLONOSCOPY WITH PROPOFOL N/A 05/07/2017   Procedure: COLONOSCOPY WITH PROPOFOL;  Surgeon: [name], MD;  Location: [facility] ENDOSCOPY;  Service: Gastroenterology;  Laterality: N/A;  . ESOPHAGOGASTRODUODENOSCOPY (EGD) WITH PROPOFOL N/A 05/07/2017   Procedure: ESOPHAGOGASTRODUODENOSCOPY (EGD) WITH PROPOFOL;  Surgeon: [name], MD;  Location: [facility] ENDOSCOPY;  Service: Gastroenterology;  Laterality: N/A;  . FOOT SURGERY Bilateral   . INCONTINENCE SURGERY  2009  . INTERSTIM IMPLANT PLACEMENT    . other     over active bladder  . OTHER SURGICAL HISTORY     bladder stimulator   . PARTIAL HYSTERECTOMY  03/1996   fibroids  . PORTA CATH INSERTION N/A 03/10/2019   Procedure: PORTA CATH INSERTION;  Surgeon: Algernon Huxley, MD;  Location: [facility] LAB;  Service: Cardiovascular;  Laterality: N/A;  . TONSILLECTOMY  2007    Family History  Problem Relation Age of Onset  . Colon cancer Father   . Renal Disease Father   . Diabetes Mellitus II Father   . Melanoma Paternal Grandmother   . Breast cancer Maternal Aunt 56  . Anemia Mother   . Heart disease Mother   . Heart failure Mother   . Renal Disease Mother   . Congestive Heart Failure Mother   . Heart disease Maternal Uncle   . Throat cancer Maternal Uncle   . Lung cancer Maternal Uncle   . Liver disease Maternal Uncle   . Heart failure Maternal Uncle   . Hearing loss Son 16       Suicide     Social History:  reports that she quit smoking about 28 years ago. Her smoking  use included cigarettes. She has a 20.00 pack-year smoking history. She has never used smokeless tobacco. She reports current alcohol use. She reports that she does not use drugs. Patient has not had ETOH in several months. She is on disability. She notes exposure to perchloroethylene [facility]).She lives much of her adult life in [place]. She was married with 2 sons. Her husband passed away. Her 2 sons took their own lives (age 75 and 55). She worked in [employer] in [employer]. She went back to school and earned an [employer] and works for [employer] for several years.She lives[place] her sistersin [place]. The patient is alone today.   Allergies:  Allergies  Allergen Reactions  . Oxycodone-Acetaminophen Anaphylaxis    Swelling and rash  . Celebrex [Celecoxib] Diarrhea  . Codeine   . Plerixafor     In 2016 during ASCT collection patient developed  fever to 103.15F and required hospitalization  . Benadryl [Diphenhydramine] Palpitations  . Morphine Itching and Rash  . Ondansetron Diarrhea  . Tylenol [Acetaminophen] Itching and Rash    Current Medications: Current Outpatient Medications  Medication Sig Dispense Refill  . acetaminophen (TYLENOL) 500 MG tablet Take 500 mg by mouth every 6 (six) hours as needed.     Marland Kitchen acyclovir (ZOVIRAX) 400 MG tablet Take 1 tablet (400 mg total) by mouth 2 (two) times daily. 60 tablet 11  . allopurinol (ZYLOPRIM) 100 MG tablet TAKE 1 TABLET(100 MG) BY MOUTH DAILY as needed 90 tablet 1  . aspirin 81 MG chewable tablet Chew 1 tablet (81 mg total) by mouth daily. 30 tablet 0  . benzonatate (TESSALON) 200 MG capsule Take 1 capsule (200 mg total) by mouth 3 (three) times daily for 5 days. 20 capsule 0  . ciprofloxacin (CIPRO) 500 MG tablet Take 1 tablet (500 mg total) by mouth 2 (two) times daily for 5 days. 10 tablet 0  . diclofenac sodium (VOLTAREN) 1 % GEL Apply 2 g topically 4 (four) times daily. 100 g 1  . diphenhydrAMINE (BENADRYL) 50 MG  capsule Take 50 mg by mouth as needed.     . fentaNYL (DURAGESIC - DOSED MCG/HR) 25 MCG/HR patch Place 25 mcg onto the skin every 3 (three) days.     Marland Kitchen FLUoxetine (PROZAC) 40 MG capsule TAKE 1 CAPSULE EVERY DAY 90 capsule 1  . fluticasone (FLONASE) 50 MCG/ACT nasal spray Place 2 sprays into both nostrils daily. 16 g 2  . glucose blood (ONE TOUCH ULTRA TEST) test strip     . lactulose (CHRONULAC) 10 GM/15ML solution Take 20 g by mouth daily as needed.     . lidocaine-prilocaine (EMLA) cream APPLY TOPICALLY AS NEEDED. 30 g 0  . metFORMIN (GLUCOPHAGE-XR) 500 MG 24 hr tablet TAKE 1 TABLET (500 MG TOTAL) BY MOUTH DAILY WITH BREAKFAST. 90 tablet 1  . Multiple Vitamin (MULTIVITAMIN) tablet Take 1 tablet by mouth daily.    . Multiple Vitamins-Minerals (HAIR/SKIN/NAILS) TABS Take 1 tablet by mouth daily.    Marland Kitchen omeprazole (PRILOSEC) 40 MG capsule TAKE 1 CAPSULE EVERY DAY 90 capsule 3  . promethazine (PHENERGAN) 25 MG tablet Take 1 tablet (25 mg total) by mouth every 6 (six) hours as needed for nausea or vomiting. 90 tablet 0  . promethazine-dextromethorphan (PROMETHAZINE-DM) 6.25-15 MG/5ML syrup Take 5 mLs by mouth every 8 (eight) hours as needed for cough. 240 mL 0  . tiZANidine (ZANAFLEX) 4 MG tablet Take 1 tablet (4 mg total) by mouth every 8 (eight) hours as needed for muscle spasms. 270 tablet 1  . traZODone (DESYREL) 100 MG tablet TAKE 1 TABLET AT BEDTIME  FOR  SLEEP 90 tablet 0  . dexamethasone (DECADRON) 4 MG tablet Take 5 tablets (48m) on days 2, 16 of cycle 3,4, 5 and 6 Take 5 tablets (25m on days 2 of cycles 7 and beyond (Patient not taking: Reported on 03/07/2020) 50 tablet 0  . diphenoxylate-atropine (LOMOTIL) 2.5-0.025 MG tablet Take 2 tablets by mouth daily as needed for diarrhea or loose stools. (Patient not taking: Reported on 03/01/2020) 45 tablet 2  . fexofenadine (ALLEGRA) 180 MG tablet TAKE 1 TABLET(180 MG) BY MOUTH DAILY (Patient not taking: Reported on 03/07/2020) 30 tablet 5  .  lisinopril (ZESTRIL) 10 MG tablet Take 10 mg by mouth once.  (Patient not taking: Reported on 03/01/2020)    . montelukast (SINGULAIR) 10 MG tablet Take 1 tablet by mouth  on the day before treatment and for 2 days after treatment. (Patient not taking: Reported on 03/07/2020) 30 tablet 0  . NARCAN 4 MG/0.1ML LIQD nasal spray kit 1 spray.  (Patient not taking: Reported on 03/01/2020)    . POMALYST 3 MG capsule TAKE 1 CAPSULE BY MOUTH EVERY DAY FOR 14 DAYS THEN OFF FOR 14 DAYS (Patient not taking: Reported on 03/07/2020) 14 capsule 1  . promethazine-dextromethorphan (PROMETHAZINE-DM) 6.25-15 MG/5ML syrup Take 5 mLs by mouth 4 (four) times daily as needed for cough. (Patient not taking: Reported on 02/21/2020) 240 mL 0   No current facility-administered medications for this visit.   Facility-Administered Medications Ordered in Other Visits  Medication Dose Route Frequency Provider Last Rate Last Admin  . 0.9 %  sodium chloride infusion   Intravenous Continuous Lequita Asal, MD   Stopped at 01/20/20 1232  . heparin lock flush 100 unit/mL  500 Units Intravenous Once Faythe Casa E, NP      . heparin lock flush 100 unit/mL  500 Units Intravenous Once Notnamed Croucher C, MD      . sodium chloride flush (NS) 0.9 % injection 10 mL  10 mL Intravenous PRN Lequita Asal, MD   10 mL at 02/07/20 0815  . sodium chloride flush (NS) 0.9 % injection 10 mL  10 mL Intracatheter PRN Cammie Sickle, MD   10 mL at 02/10/20 0815  . sodium chloride flush (NS) 0.9 % injection 10 mL  10 mL Intravenous PRN Jacquelin Hawking, NP   10 mL at 03/01/20 1143  . sodium chloride flush (NS) 0.9 % injection 10 mL  10 mL Intravenous PRN Lequita Asal, MD   10 mL at 03/07/20 5361    Review of Systems  Constitutional: Negative for chills, diaphoresis, fever, malaise/fatigue and weight loss (no new weight).       Feels "ok".  HENT: Positive for congestion. Negative for ear discharge, ear pain, hearing loss,  nosebleeds, sinus pain, sore throat and tinnitus.   Eyes: Negative.  Negative for blurred vision, double vision and photophobia.  Respiratory: Positive for cough. Negative for hemoptysis, sputum production, shortness of breath and wheezing.   Cardiovascular: Negative.  Negative for chest pain, palpitations and leg swelling.  Gastrointestinal: Positive for abdominal pain, constipation (no BM since Friday), heartburn (on Prilosec), nausea and vomiting. Negative for blood in stool, diarrhea and melena.  Genitourinary: Negative.  Negative for dysuria, frequency and urgency.  Musculoskeletal: Positive for back pain (chronic; paraspinal tenderness; upper back). Negative for falls, joint pain, myalgias and neck pain.  Skin: Negative.  Negative for itching and rash.  Neurological: Positive for sensory change (chronic numbness in fingers and toes, stable). Negative for dizziness, tingling, weakness and headaches.  Endo/Heme/Allergies: Negative.  Negative for environmental allergies. Does not bruise/bleed easily.       Diabetes, under good control.  Psychiatric/Behavioral: Negative.  Negative for memory loss. The patient is not nervous/anxious and does not have insomnia.   All other systems reviewed and are negative.   Performance status (ECOG): 1  Vitals Blood pressure 133/61, pulse 91, resp. rate 18, height 5' 3"  (1.6 m), SpO2 97 %.  Physical Exam Vitals and nursing note reviewed.  Constitutional:      General: She is not in acute distress.    Appearance: She is well-developed. She is not diaphoretic.     Interventions: Face mask in place.     Comments: Fatigued appearing woman sitting in the exam room.  HENT:  Head: Normocephalic and atraumatic.     Comments: Short curly blonde hair.    Mouth/Throat:     Mouth: Mucous membranes are moist.     Pharynx: Oropharynx is clear.  Eyes:     General: No scleral icterus.    Extraocular Movements: Extraocular movements intact.      Conjunctiva/sclera: Conjunctivae normal.     Pupils: Pupils are equal, round, and reactive to light.     Comments: Glasses. Brown eyes.   Cardiovascular:     Rate and Rhythm: Normal rate and regular rhythm.     Heart sounds: Normal heart sounds. No murmur heard.   Pulmonary:     Effort: Pulmonary effort is normal. No respiratory distress.     Breath sounds: Wheezing present. No rales.     Comments: Scattered crackles. Chest:     Chest wall: No tenderness.  Abdominal:     General: Bowel sounds are normal. There is no distension.     Palpations: Abdomen is soft. There is no hepatomegaly, splenomegaly or mass.     Tenderness: There is abdominal tenderness (epigastric). There is no guarding or rebound.  Musculoskeletal:        General: No swelling or tenderness. Normal range of motion.     Cervical back: Normal range of motion and neck supple.  Lymphadenopathy:     Head:     Right side of head: No preauricular, posterior auricular or occipital adenopathy.     Left side of head: No preauricular, posterior auricular or occipital adenopathy.     Cervical: No cervical adenopathy.     Upper Body:     Right upper body: No supraclavicular or axillary adenopathy.     Left upper body: No supraclavicular or axillary adenopathy.     Lower Body: No right inguinal adenopathy. No left inguinal adenopathy.  Skin:    General: Skin is warm and dry.  Neurological:     Mental Status: She is alert and oriented to person, place, and time. Mental status is at baseline.  Psychiatric:        Behavior: Behavior normal.        Thought Content: Thought content normal.        Judgment: Judgment normal.    Infusion on 03/07/2020  Component Date Value Ref Range Status  . WBC 03/07/2020 4.8  4.0 - 10.5 K/uL Final  . RBC 03/07/2020 3.63* 3.87 - 5.11 MIL/uL Final  . Hemoglobin 03/07/2020 10.7* 12.0 - 15.0 g/dL Final  . HCT 03/07/2020 31.5* 36 - 46 % Final  . MCV 03/07/2020 86.8  80.0 - 100.0 fL Final  . MCH  03/07/2020 29.5  26.0 - 34.0 pg Final  . MCHC 03/07/2020 34.0  30.0 - 36.0 g/dL Final  . RDW 03/07/2020 13.6  11.5 - 15.5 % Final  . Platelets 03/07/2020 177  150 - 400 K/uL Final  . nRBC 03/07/2020 0.0  0.0 - 0.2 % Final   Performed at [facility], [address], [place], [place] [address]  Admission on 03/01/2020, Discharged on 03/05/2020  Component Date Value Ref Range Status  . Sodium 03/01/2020 137  135 - 145 mmol/L Final  . Potassium 03/01/2020 4.3  3.5 - 5.1 mmol/L Final  . Chloride 03/01/2020 105  98 - 111 mmol/L Final  . CO2 03/01/2020 23  22 - 32 mmol/L Final  . Glucose, Bld 03/01/2020 112* 70 - 99 mg/dL Final   Glucose reference range applies only to samples taken after fasting  for at least 8 hours.  . BUN 03/01/2020 12  8 - 23 mg/dL Final  . Creatinine, Ser 03/01/2020 0.88  0.44 - 1.00 mg/dL Final  . Calcium 03/01/2020 8.5* 8.9 - 10.3 mg/dL Final  . Total Protein 03/01/2020 6.8  6.5 - 8.1 g/dL Final  . Albumin 03/01/2020 4.0  3.5 - 5.0 g/dL Final  . AST 03/01/2020 25  15 - 41 U/L Final  . ALT 03/01/2020 23  0 - 44 U/L Final  . Alkaline Phosphatase 03/01/2020 74  38 - 126 U/L Final  . Total Bilirubin 03/01/2020 0.4  0.3 - 1.2 mg/dL Final  . GFR calc non Af Amer 03/01/2020 >60  >60 mL/min Final  . GFR calc Af Amer 03/01/2020 >60  >60 mL/min Final  . Anion gap 03/01/2020 9  5 - 15 Final   Performed at [facility], [address], [place], [place] [address]  . Lactic Acid, Venous 03/01/2020 1.3  0.5 - 1.9 mmol/L Final   Performed at [facility], [address] [place], [place] [address]  . WBC 03/01/2020 2.6* 4.0 - 10.5 K/uL Final  . RBC 03/01/2020 3.45* 3.87 - 5.11 MIL/uL Final  . Hemoglobin 03/01/2020 10.2* 12.0 - 15.0 g/dL Final  . HCT 03/01/2020 31.1* 36 - 46 % Final  . MCV 03/01/2020 90.1  80.0 - 100.0 fL Final  . MCH 03/01/2020 29.6  26.0 - 34.0 pg Final  . MCHC 03/01/2020 32.8  30.0 - 36.0 g/dL Final  . RDW 03/01/2020 13.2  11.5  - 15.5 % Final  . Platelets 03/01/2020 157  150 - 400 K/uL Final  . nRBC 03/01/2020 0.0  0.0 - 0.2 % Final  . Neutrophils Relative % 03/01/2020 31  % Final  . Neutro Abs 03/01/2020 0.8* 1.7 - 7.7 K/uL Final  . Lymphocytes Relative 03/01/2020 32  % Final  . Lymphs Abs 03/01/2020 0.9  0.7 - 4.0 K/uL Final  . Monocytes Relative 03/01/2020 32  % Final  . Monocytes Absolute 03/01/2020 0.9  0 - 1 K/uL Final  . Eosinophils Relative 03/01/2020 3  % Final  . Eosinophils Absolute 03/01/2020 0.1  0 - 0 K/uL Final  . Basophils Relative 03/01/2020 2  % Final  . Basophils Absolute 03/01/2020 0.0  0 - 0 K/uL Final  . Immature Granulocytes 03/01/2020 0  % Final  . Abs Immature Granulocytes 03/01/2020 0.00  0.00 - 0.07 K/uL Final   Performed at [facility], [address] [place], [place] [address]  . SARS Coronavirus 2 03/01/2020 NEGATIVE  NEGATIVE Final   Comment: (NOTE) SARS-CoV-2 target nucleic acids are NOT DETECTED.  The SARS-CoV-2 RNA is generally detectable in upper and lower respiratory specimens during the acute phase of infection. The lowest concentration of SARS-CoV-2 viral copies this assay can detect is 250 copies / mL. A negative result does not preclude SARS-CoV-2 infection and should not be used as the sole basis for treatment or other patient management decisions.  A negative result may occur with improper specimen collection / handling, submission of specimen other than nasopharyngeal swab, presence of viral mutation(s) within the areas targeted by this assay, and inadequate number of viral copies (<250 copies / mL). A negative result must be combined with clinical observations, patient history, and epidemiological information.  Fact Sheet for Patients:   StrictlyIdeas.no  Fact Sheet for Healthcare Providers: BankingDealers.co.za  This test is not yet approved or  cleared by the [place] and has been authorized for detection and/or diagnosis of SARS-CoV-2 by FDA under an Emergency Use Authorization (EUA).  This EUA will remain in effect (meaning this test can be used) for the duration of the COVID-19 declaration under Section 564(b)(1) of the Act, 21 U.S.C. section 360bbb-3(b)(1), unless the authorization is terminated or revoked sooner.  Performed at [facility], [address] [place], [place] [address]   . Procalcitonin 03/01/2020 <0.10  ng/mL Final   Comment:        Interpretation: PCT (Procalcitonin) <= 0.5 ng/mL: Systemic infection (sepsis) is not likely. Local bacterial infection is possible. (NOTE)       Sepsis PCT Algorithm           Lower Respiratory Tract                                      Infection PCT Algorithm    ----------------------------     ----------------------------         PCT < 0.25 ng/mL                PCT < 0.10 ng/mL          Strongly encourage             Strongly discourage   discontinuation of antibiotics    initiation of antibiotics    ----------------------------     -----------------------------       PCT 0.25 - 0.50 ng/mL            PCT 0.10 - 0.25 ng/mL               OR       >80% decrease in PCT            Discourage initiation of                                            antibiotics      Encourage discontinuation           of antibiotics    ----------------------------     -----------------------------         PCT >= 0.50 ng/mL              PCT 0.26 - 0.50 ng/mL               AND                                 <80% decrease in PCT             Encourage initiation of                                             antibiotics       Encourage continuation           of antibiotics    ----------------------------     -----------------------------        PCT >= 0.50 ng/mL                  PCT > 0.50 ng/mL  AND         increase in PCT                  Strongly encourage                                       initiation of antibiotics    Strongly encourage escalation           of antibiotics                                     -----------------------------                                           PCT <= 0.25 ng/mL                                                 OR                                        > 80% decrease in PCT                                      Discontinue / Do not initiate                                             antibiotics  Performed at [facility], [address] [place], [place] [address]   . Sodium 03/02/2020 139  135 - 145 mmol/L Final  . Potassium 03/02/2020 4.1  3.5 - 5.1 mmol/L Final  . Chloride 03/02/2020 108  98 - 111 mmol/L Final  . CO2 03/02/2020 27  22 - 32 mmol/L Final  . Glucose, Bld 03/02/2020 103* 70 - 99 mg/dL Final   Glucose reference range applies only to samples taken after fasting for at least 8 hours.  . BUN 03/02/2020 10  8 - 23 mg/dL Final  . Creatinine, Ser 03/02/2020 0.82  0.44 - 1.00 mg/dL Final  . Calcium 03/02/2020 8.0* 8.9 - 10.3 mg/dL Final  . GFR calc non Af Amer 03/02/2020 >60  >60 mL/min Final  . GFR calc Af Amer 03/02/2020 >60  >60 mL/min Final  . Anion gap 03/02/2020 4* 5 - 15 Final   Performed at [facility], [address] [place], [place] [address]  . Hgb A1c MFr Bld 03/01/2020 5.9* 4.8 - 5.6 % Final   Comment: (NOTE) Pre diabetes:          5.7%-6.4%  Diabetes:              >6.4%  Glycemic control for   <7.0% adults with diabetes   . Mean Plasma Glucose 03/01/2020 122.63  mg/dL Final   Performed at [facility],  [place] [address]  . WBC 03/02/2020 1.9* 4.0 - 10.5 K/uL Final  . RBC 03/02/2020 3.20* 3.87 - 5.11 MIL/uL Final  . Hemoglobin 03/02/2020 9.6* 12.0 - 15.0 g/dL Final  . HCT 03/02/2020 28.8* 36 - 46 % Final  . MCV 03/02/2020 90.0  80.0 - 100.0 fL Final  . MCH 03/02/2020 30.0  26.0 - 34.0 pg Final  . MCHC 03/02/2020 33.3  30.0 - 36.0 g/dL Final  .  RDW 03/02/2020 13.2  11.5 - 15.5 % Final  . Platelets 03/02/2020 138* 150 - 400 K/uL Final  . nRBC 03/02/2020 0.0  0.0 - 0.2 % Final  . Neutrophils Relative % 03/02/2020 25  % Final  . Neutro Abs 03/02/2020 0.5* 1.7 - 7.7 K/uL Final  . Lymphocytes Relative 03/02/2020 32  % Final  . Lymphs Abs 03/02/2020 0.6* 0.7 - 4.0 K/uL Final  . Monocytes Relative 03/02/2020 34  % Final  . Monocytes Absolute 03/02/2020 0.7  0 - 1 K/uL Final  . Eosinophils Relative 03/02/2020 6  % Final  . Eosinophils Absolute 03/02/2020 0.1  0 - 0 K/uL Final  . Basophils Relative 03/02/2020 2  % Final  . Basophils Absolute 03/02/2020 0.0  0 - 0 K/uL Final  . WBC Morphology 03/02/2020 MORPHOLOGY UNREMARKABLE   Final  . RBC Morphology 03/02/2020 MORPHOLOGY UNREMARKABLE   Final  . Smear Review 03/02/2020 Normal platelet morphology   Final  . Immature Granulocytes 03/02/2020 1  % Final  . Abs Immature Granulocytes 03/02/2020 0.01  0.00 - 0.07 K/uL Final   Performed at [facility], [address] [place], [place] [address]  . Lactic Acid, Venous 03/02/2020 1.1  0.5 - 1.9 mmol/L Final   Performed at [facility], [address] [place], [place] [address]  . Glucose-Capillary 03/02/2020 93  70 - 99 mg/dL Final   Glucose reference range applies only to samples taken after fasting for at least 8 hours.  . Magnesium 03/02/2020 1.4* 1.7 - 2.4 mg/dL Final   Performed at [facility], [address] [place], [place] [address]  . WBC 03/03/2020 2.2* 4.0 - 10.5 K/uL Final  . RBC 03/03/2020 3.29* 3.87 - 5.11 MIL/uL Final  . Hemoglobin 03/03/2020 10.0* 12.0 - 15.0 g/dL Final  . HCT 03/03/2020 28.4* 36 - 46 % Final  . MCV 03/03/2020 86.3  80.0 - 100.0 fL Final  . MCH 03/03/2020 30.4  26.0 - 34.0 pg Final  . MCHC 03/03/2020 35.2  30.0 - 36.0 g/dL Final  . RDW 03/03/2020 13.2  11.5 - 15.5 % Final  . Platelets 03/03/2020 140* 150 - 400 K/uL Final  . nRBC 03/03/2020 0.0  0.0 - 0.2 % Final   Performed  at [facility], [address] [place],  [address]  . Sodium 03/03/2020 139  135 - 145 mmol/L Final  . Potassium 03/03/2020 4.1  3.5 - 5.1 mmol/L Final  . Chloride 03/03/2020 107  98 - 111 mmol/L Final  . CO2 03/03/2020 24  22 - 32 mmol/L Final  . Glucose, Bld 03/03/2020 108* 70 - 99 mg/dL Final   Glucose reference range applies only to samples taken after fasting for at least 8 hours.  . BUN 03/03/2020 17  8 - 23 mg/dL Final  . Creatinine, Ser 03/03/2020 0.93  0.44 - 1.00 mg/dL Final  . Calcium 03/03/2020 8.8* 8.9 - 10.3 mg/dL Final  . GFR calc non Af Amer 03/03/2020 >60  >60 mL/min Final  . GFR calc  Af Amer 03/03/2020 >60  >60 mL/min Final  . Anion gap 03/03/2020 8  5 - 15 Final   Performed at [facility], [address] [place], [place] [address]  . Glucose-Capillary 03/02/2020 104* 70 - 99 mg/dL Final   Glucose reference range applies only to samples taken after fasting for at least 8 hours.  . Comment 1 03/02/2020 Notify RN   Final  . Comment 2 03/02/2020 Document in Chart   Final  . Glucose-Capillary 03/02/2020 122* 70 - 99 mg/dL Final   Glucose reference range applies only to samples taken after fasting for at least 8 hours.  . Comment 1 03/02/2020 Notify RN   Final  . Comment 2 03/02/2020 Document in Chart   Final  . Glucose-Capillary 03/02/2020 136* 70 - 99 mg/dL Final   Glucose reference range applies only to samples taken after fasting for at least 8 hours.  . Glucose-Capillary 03/03/2020 108* 70 - 99 mg/dL Final   Glucose reference range applies only to samples taken after fasting for at least 8 hours.  . Glucose-Capillary 03/03/2020 147* 70 - 99 mg/dL Final   Glucose reference range applies only to samples taken after fasting for at least 8 hours.  . Sodium 03/04/2020 137  135 - 145 mmol/L Final  . Potassium 03/04/2020 4.2  3.5 - 5.1 mmol/L Final  . Chloride 03/04/2020 106  98 - 111 mmol/L Final  . CO2 03/04/2020 25  22 - 32 mmol/L Final  .  Glucose, Bld 03/04/2020 101* 70 - 99 mg/dL Final   Glucose reference range applies only to samples taken after fasting for at least 8 hours.  . BUN 03/04/2020 19  8 - 23 mg/dL Final  . Creatinine, Ser 03/04/2020 0.96  0.44 - 1.00 mg/dL Final  . Calcium 03/04/2020 8.7* 8.9 - 10.3 mg/dL Final  . GFR calc non Af Amer 03/04/2020 >60  >60 mL/min Final  . GFR calc Af Amer 03/04/2020 >60  >60 mL/min Final  . Anion gap 03/04/2020 6  5 - 15 Final   Performed at [facility], [address], [place], [place] [address]  . Glucose-Capillary 03/03/2020 111* 70 - 99 mg/dL Final   Glucose reference range applies only to samples taken after fasting for at least 8 hours.  . WBC 03/04/2020 2.6* 4.0 - 10.5 K/uL Final  . RBC 03/04/2020 3.18* 3.87 - 5.11 MIL/uL Final  . Hemoglobin 03/04/2020 9.5* 12.0 - 15.0 g/dL Final  . HCT 03/04/2020 27.1* 36 - 46 % Final  . MCV 03/04/2020 85.2  80.0 - 100.0 fL Final  . MCH 03/04/2020 29.9  26.0 - 34.0 pg Final  . MCHC 03/04/2020 35.1  30.0 - 36.0 g/dL Final  . RDW 03/04/2020 13.3  11.5 - 15.5 % Final  . Platelets 03/04/2020 153  150 - 400 K/uL Final  . nRBC 03/04/2020 0.0  0.0 - 0.2 % Final  . Neutrophils Relative % 03/04/2020 39  % Final  . Neutro Abs 03/04/2020 1.0* 1.7 - 7.7 K/uL Final  . Lymphocytes Relative 03/04/2020 38  % Final  . Lymphs Abs 03/04/2020 1.0  0.7 - 4.0 K/uL Final  . Monocytes Relative 03/04/2020 15  % Final  . Monocytes Absolute 03/04/2020 0.4  0 - 1 K/uL Final  . Eosinophils Relative 03/04/2020 7  % Final  . Eosinophils Absolute 03/04/2020 0.2  0 - 0 K/uL Final  . Basophils Relative 03/04/2020 1  % Final  . Basophils Absolute 03/04/2020 0.0  0 -  0 K/uL Final  . Immature Granulocytes 03/04/2020 0  % Final  . Abs Immature Granulocytes 03/04/2020 0.01  0.00 - 0.07 K/uL Final   Performed at [facility], [address], [place], [place] [address]  . Glucose-Capillary 03/03/2020 125* 70 - 99 mg/dL Final   Glucose reference  range applies only to samples taken after fasting for at least 8 hours.  . Comment 1 03/03/2020 Notify RN   Final  . Glucose-Capillary 03/04/2020 106* 70 - 99 mg/dL Final   Glucose reference range applies only to samples taken after fasting for at least 8 hours.  . Comment 1 03/04/2020 Notify RN   Final  . Comment 2 03/04/2020 Document in Chart   Final  . Glucose-Capillary 03/04/2020 96  70 - 99 mg/dL Final   Glucose reference range applies only to samples taken after fasting for at least 8 hours.  . Comment 1 03/04/2020 Notify RN   Final  . Comment 2 03/04/2020 Document in Chart   Final  . Glucose-Capillary 03/04/2020 101* 70 - 99 mg/dL Final   Glucose reference range applies only to samples taken after fasting for at least 8 hours.  . Comment 1 03/04/2020 Notify RN   Final  . Comment 2 03/04/2020 Document in Chart   Final  . Sodium 03/05/2020 136  135 - 145 mmol/L Final  . Potassium 03/05/2020 3.8  3.5 - 5.1 mmol/L Final  . Chloride 03/05/2020 102  98 - 111 mmol/L Final  . CO2 03/05/2020 25  22 - 32 mmol/L Final  . Glucose, Bld 03/05/2020 114* 70 - 99 mg/dL Final   Glucose reference range applies only to samples taken after fasting for at least 8 hours.  . BUN 03/05/2020 25* 8 - 23 mg/dL Final  . Creatinine, Ser 03/05/2020 0.92  0.44 - 1.00 mg/dL Final  . Calcium 03/05/2020 9.5  8.9 - 10.3 mg/dL Final  . GFR calc non Af Amer 03/05/2020 >60  >60 mL/min Final  . GFR calc Af Amer 03/05/2020 >60  >60 mL/min Final  . Anion gap 03/05/2020 9  5 - 15 Final   Performed at [facility], [address] [place], [place] [address]  . WBC 03/05/2020 3.5* 4.0 - 10.5 K/uL Final  . RBC 03/05/2020 3.62* 3.87 - 5.11 MIL/uL Final  . Hemoglobin 03/05/2020 10.9* 12.0 - 15.0 g/dL Final  . HCT 03/05/2020 30.6* 36 - 46 % Final  . MCV 03/05/2020 84.5  80.0 - 100.0 fL Final  . MCH 03/05/2020 30.1  26.0 - 34.0 pg Final  . MCHC 03/05/2020 35.6  30.0 - 36.0 g/dL Final  . RDW 03/05/2020 13.2  11.5 -  15.5 % Final  . Platelets 03/05/2020 181  150 - 400 K/uL Final  . nRBC 03/05/2020 0.0  0.0 - 0.2 % Final  . Neutrophils Relative % 03/05/2020 47  % Final  . Neutro Abs 03/05/2020 1.6* 1.7 - 7.7 K/uL Final  . Lymphocytes Relative 03/05/2020 36  % Final  . Lymphs Abs 03/05/2020 1.3  0.7 - 4.0 K/uL Final  . Monocytes Relative 03/05/2020 13  % Final  . Monocytes Absolute 03/05/2020 0.5  0 - 1 K/uL Final  . Eosinophils Relative 03/05/2020 2  % Final  . Eosinophils Absolute 03/05/2020 0.1  0 - 0 K/uL Final  . Basophils Relative 03/05/2020 1  % Final  . Basophils Absolute 03/05/2020 0.0  0 - 0 K/uL Final  . Immature Granulocytes 03/05/2020 1  % Final  .  Abs Immature Granulocytes 03/05/2020 0.03  0.00 - 0.07 K/uL Final   Performed at [facility], [address] [place], [place] [address]  . Glucose-Capillary 03/04/2020 106* 70 - 99 mg/dL Final   Glucose reference range applies only to samples taken after fasting for at least 8 hours.  . Comment 1 03/04/2020 Notify RN   Final  . Glucose-Capillary 03/05/2020 97  70 - 99 mg/dL Final   Glucose reference range applies only to samples taken after fasting for at least 8 hours.  . Comment 1 03/05/2020 Notify RN   Final  . Comment 2 03/05/2020 Document in Chart   Final  . Glucose-Capillary 03/05/2020 102* 70 - 99 mg/dL Final   Glucose reference range applies only to samples taken after fasting for at least 8 hours.  . Comment 1 03/05/2020 Notify RN   Final  . Comment 2 03/05/2020 Document in Chart   Final    Assessment:  Sarah [name] is a 64 y.o. 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 Bone marrow revealed 80% plasma cells. Lambda free light chains were 1340. She had nephrotic range proteinuria. She initially underwent induction with RVD. Revlimid maintenance was discontinued on 01/21/2017 secondary to intolerance.   Bone  marrowaspirate andbiopsyon 01/18/2021revealed anormocellularmarrow withbut increased lambda-restricted plasma cells (9% aspirate, 40% CD138 immunohistochemistry).Findingswereconsistent with recurrent plasma cell myeloma.Flow cytometry revealed no monoclonal B-cell or phenotypically aberrant T-cell population. Cytogeneticswere 53, XX (normal). 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  M-spikehas been followed: 0 on 04/02/2016 -06/23/2019; 0.2 on 09/02/2017, 0.1 on 11/25/2019, 0.1 on 01/10/2020, 0.2 on 02/07/2020, and 01 on 03/07/2020.  Lambda light chainshave been followed: 22.2 (ratio 0.56) on 07/03/2017, 30.8 (ratio 0.78) on 09/02/2017, 36.9 (ratio 0.40) on 10/21/2017, 37.4 (ratio 0.41) on 12/16/2017, 70.7(ratio 0.31) on 02/17/2018, 64.2 (ratio 0.27) on 04/07/2018, 78.9 (ratio 0.18) on 05/26/2018, 128.8 (ratio 0.17) on 08/06/2018, 181.5 (ratio 0.13) on 10/08/2018, 130.9 (ratio 0.13) on 10/20/2018, 160.7 (ratio 0.10)on 12/09/2018, 236.6 (ratio 0.07) on 02/01/2019, 363.6 (ratio 0.04) on 03/22/2019, 404.8 (ratio 0.04) on 04/05/2019, 420.7 (ratio 0.03) on 05/24/2019, 573.4 (ratio 0.03) on 06/23/2019, 451.05(ratio 0.02) on02/19/2021, 47.2 (ratio 0.13) on 10/25/2019, 22.4 (ratio 0.21) on 11/25/2019, 16.5 (ratio 0.33) on 01/10/2020, 14.6 (ratio 0.34) on 02/07/2020, and 13.1 (ratio 0.31).  24 hour UPEPon 06/03/2019 revealed kappa free light chains95.76,lambda free light chains1,260.71, andratio 0.08.24 hourUPEPon 02/22/2021revealed totalproteinof771m/24 hrs withlambdafree light chains 1,084.147mL andratioof0.10(1.03-31.76). M spike inurinewas46.1%(36177m4 hrs).  Bone surveyon 04/08/2016 and11/28/2018 revealed no definite lytic lesion seen in the visualized skeleton.Bone surveyon 11/19/2018 revealed no suspicious lucent lesionsand no acute bony abnormality.PET scanon 07/12/2019 revealed no focal metabolic activity to suggest active  myeloma within the skeleton. There wereno lytic lesions identified on the CT portion of the examorsoft tissue plasmacytomas. There was no evidence of multiple myeloma.  Pretreatment RBC phenotypeon 09/23/2019 waspositivefor C, e, DUFFY B, KIDD B, M, S, and s antigen; negativefor c, E, KELL, DUFFY A, KIDD A, and N antigen.  Shes/p 5 cycles of daratumumab and hyaluronidase-fihj, Pomalyst, and Decadron (DPd)(09/27/2019 - 10/25/2019; 12/09/2019 - 02/07/2020). Cycle #1 was complicated by fever and neutropenia requiring admission.  Cycle #2 was complicated by pneumonia requiring admission.  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 Zometa was discontinued in 01/2017. She has chronic nauseaon Phenergan.  She has B12 deficiency. B12 was 254 on 04/09/2017, 295 on 08/20/2018, and 391 on 10/08/2018. Shewasonoral B12.She  received B12 monthly (last08/05/2020).Folate was18.6on 02/08/2019.  She has iron deficiency. Ferritin was 32 on 07/01/2019. She received Venoferon 07/15/2019 and 07/22/2019.  She has hypogammaglobulinemia.  IgG was 245 on 11/25/2019.  She received monthly IVIG (07/22/021 - 02/17/2020).  She received IVIG 400 mg/kg (25 gm) on 01/20/2020 and 200 mg/kg (15 gm) on 02/17/2020.  IVIG on 16/04/9603 was complicated by acute renal failure. IgG trough level was 418 on 02/17/2020.  Colonoscopy on 05/07/2017 was normal. EGD on 05/07/2017 revealed gastritis.  She was admitted to [place] 10/15/2019 - 10/20/2019 with fever and neutropenia. Cultures were negative. CXR was negative. She received broad spectrum antibiotics and daily Granix. She received IVF for acute renal insufficiency due to diarrhea and dehydration. Creatine was 1.66 on admission and 1.12 on discharge.  She was admitted to [facility] from 03/01/2020 - 03/05/2020 with fever and neutropenia. CXR revealed no active cardiopulmonary disease. Chest CT with contrast revealed no acute  intrathoracic pathology. There were findings which could be suggestive of prior granulomatous disease. She was treated with Cefepime and Vancomycin, then switched to ciprofloxacin on 03/03/2020.   She received her second COVID-19vaccineon 10/09/2019.  Symptomatically, she has a cough and abdominal discomfort and constipation.  Exam revealed scattered crackles and wheezes.  WBC 4700 (Ronda 300).  Magnesium is 1.1.  Plan: 1.   Labs today: CBC with diff, CMP, Mg, SPEP, FLCA. 2. Stage III multiple myeloma Clinically, she is doing fair. Lambda free light chains have improved from451.05on02/19/2021.   After cycle #1:  47.2.   After cycle #5:  13.1. Labs reviewed.  Discuss postponing initiation of cycle #6 daratumumab plus Pomalyst and dexamethasone (DPd) secondary to ongoing symptoms s/p recent hospitalization. Neuropathy is stable.   She remains fatigued.             Treatment plan for cycles # 4 - 6 (28 day cycle):                          Pomalidomide (Pomalyst) 3 mg po q day 1-14.                         Decadron 20 mg po day 1-2 and 15-16. Daratumumab and hyaluronidase-fihj (Darzalez Faspro) 1800 mg SQ on days 1 and 15. Premeds for [employer]:  Singulair 10 mg po day before, day of, and 2 days. Phenergan 25 mg IV,Benadryl 50 mg po, andTylenol 650 mg po.  Discuss symptom management.  She has antiemetics at home to use on a prn bases.  Interventions are adequate.       Continue Phenergan for nausea.    She will receive 3 months of therapy prior to autologous stem cell transplant in order to build up her strength, endurance and weight.  3. Normocytic anemia Hematocrit32.1. Hemoglobin 10.9. MCV87.0.Platelets193,000today. Ferritin51  on 01/10/2020. TSH was normal on02/20/2020.  Creatinine 1.17(CrCl44.55m/minute). Continue to monitor. 4. Grade II peripheral neuropathy  Neuropathy remains stable.  Continue to monitor. 5.B12 deficiency She receives monthly B12 (last 02/10/2020).   Folate 12.1 on 01/10/2020.  Monitor folate annually. 6. Renal insufficiency  Creatinine is 1.17 today.  Creatinine 1.04 on 01/17/2020 and 2.11 on 01/24/2020.  Etiology of acute renal insufficiency was felt secondary to IVIG on 01/20/2020.  Renal ultrasound revealed no hydronephrosis. 7.   Hypogammaglobulinemia  Patient has had recurrent infections.    She received her first dose of IVIG (25 gm) on 01/20/2020 with subsequent renal insufficiency.  She  received her second dose of IVIG (15 gm) on 02/17/2020 without renal insufficiency.   IVIG dose was reduced, infusion time increased and she received pre and post hydration on 02/17/2020 and 02/18/2020.   Goal IgG trough level is 500.   Advance dose as tolerated 8.   Hypomagnesemia, chronic Magnesium1.1 today. Magnesium 6 gm IV today. Continue IV magnesium supplementation twice weekly (Mondays and Thursdays).Marland Kitchen   9.Hypokalemia  Potassium 3.3 today.  Potassium 20 meq IV today. 10.   GI symptoms  Chronic nausea managed with Phenergan (po at home and IV in clinic).  Discuss management of constipation.  Encourage follow-up with Dr Vicente Males. 11.   Cough  Patient has cough and scattered crackles and wheezes on exam.  Check influenza A, influenza B and RSV.  COVID-19 testing was negative in hospital.  Patient with negative chest CT in hospital.  Follow-up CXR today. 10.   No chemotherapy today. 11.   Testing for influenza A and B and RSV. 12.   CXR (PA and lateral) today. 13.   Please schedule follow-up with Dr Vicente Males (well known to him). 14.   RTC on Thursday for  labs (Mg) and IV Mg. 15.   RTC in 1 week for MD assessment, labs (CBC with diff, CMP, Mg), and day 1 of cycle #6 daratumumab.  Addendum:  CXR revealed no acute cardiopulmonary disease.  Influenza A, influenza B, RSV, and COVID-19 were negatibe.  I discussed the assessment and treatment plan with the patient.  The patient was provided an opportunity to ask questions and all were answered.  The patient agreed with the plan and demonstrated an understanding of the instructions.  The patient was advised to call back if the symptoms worsen or if the condition fails to improve as anticipated.   Lequita Asal, MD, PhD    03/07/2020, 8:57 AM   I, Mirian Mo Tufford, am acting as a Education administrator for [employer]. Mike Gip, MD.   I, Trixie Maclaren C. Mike Gip, MD, have reviewed the above documentation for accuracy and completeness, and I agree with the above.

## 2020-03-07 ENCOUNTER — Inpatient Hospital Stay: Payer: Medicare PPO

## 2020-03-07 ENCOUNTER — Inpatient Hospital Stay (HOSPITAL_BASED_OUTPATIENT_CLINIC_OR_DEPARTMENT_OTHER): Payer: Medicare PPO | Admitting: Hematology and Oncology

## 2020-03-07 ENCOUNTER — Other Ambulatory Visit: Payer: Self-pay

## 2020-03-07 ENCOUNTER — Ambulatory Visit
Admission: RE | Admit: 2020-03-07 | Discharge: 2020-03-07 | Disposition: A | Discharge status: Home / Self Care | Payer: Medicare PPO | Attending: Hematology and Oncology | Admitting: Hematology and Oncology

## 2020-03-07 ENCOUNTER — Telehealth: Payer: Self-pay | Admitting: *Deleted

## 2020-03-07 ENCOUNTER — Encounter: Payer: Self-pay | Admitting: Hematology and Oncology

## 2020-03-07 ENCOUNTER — Ambulatory Visit
Admission: RE | Admit: 2020-03-07 | Discharge: 2020-03-07 | Disposition: A | Discharge status: Home / Self Care | Payer: Medicare PPO | Source: Ambulatory Visit | Attending: Hematology and Oncology | Admitting: Hematology and Oncology

## 2020-03-07 VITALS — BP 133/61 | HR 91 | Resp 18 | Ht 63.0 in

## 2020-03-07 DIAGNOSIS — E1122 Type 2 diabetes mellitus with diabetic chronic kidney disease: Secondary | ICD-10-CM | POA: Diagnosis not present

## 2020-03-07 DIAGNOSIS — M899 Disorder of bone, unspecified: Secondary | ICD-10-CM | POA: Diagnosis not present

## 2020-03-07 DIAGNOSIS — Z5112 Encounter for antineoplastic immunotherapy: Secondary | ICD-10-CM | POA: Diagnosis not present

## 2020-03-07 DIAGNOSIS — I509 Heart failure, unspecified: Secondary | ICD-10-CM | POA: Diagnosis not present

## 2020-03-07 DIAGNOSIS — R110 Nausea: Secondary | ICD-10-CM

## 2020-03-07 DIAGNOSIS — N183 Chronic kidney disease, stage 3 unspecified: Secondary | ICD-10-CM | POA: Diagnosis not present

## 2020-03-07 DIAGNOSIS — N289 Disorder of kidney and ureter, unspecified: Secondary | ICD-10-CM | POA: Diagnosis not present

## 2020-03-07 DIAGNOSIS — T451X5A Adverse effect of antineoplastic and immunosuppressive drugs, initial encounter: Secondary | ICD-10-CM

## 2020-03-07 DIAGNOSIS — N179 Acute kidney failure, unspecified: Secondary | ICD-10-CM | POA: Diagnosis not present

## 2020-03-07 DIAGNOSIS — R05 Cough: Secondary | ICD-10-CM | POA: Diagnosis not present

## 2020-03-07 DIAGNOSIS — R809 Proteinuria, unspecified: Secondary | ICD-10-CM | POA: Diagnosis not present

## 2020-03-07 DIAGNOSIS — D801 Nonfamilial hypogammaglobulinemia: Secondary | ICD-10-CM | POA: Diagnosis not present

## 2020-03-07 DIAGNOSIS — E538 Deficiency of other specified B group vitamins: Secondary | ICD-10-CM

## 2020-03-07 DIAGNOSIS — C9000 Multiple myeloma not having achieved remission: Secondary | ICD-10-CM

## 2020-03-07 DIAGNOSIS — D649 Anemia, unspecified: Secondary | ICD-10-CM | POA: Diagnosis not present

## 2020-03-07 DIAGNOSIS — Z1152 Encounter for screening for COVID-19: Secondary | ICD-10-CM | POA: Diagnosis not present

## 2020-03-07 DIAGNOSIS — Z7189 Other specified counseling: Secondary | ICD-10-CM

## 2020-03-07 DIAGNOSIS — R5383 Other fatigue: Secondary | ICD-10-CM | POA: Diagnosis not present

## 2020-03-07 DIAGNOSIS — R059 Cough, unspecified: Secondary | ICD-10-CM

## 2020-03-07 DIAGNOSIS — F419 Anxiety disorder, unspecified: Secondary | ICD-10-CM | POA: Diagnosis not present

## 2020-03-07 DIAGNOSIS — G620 Drug-induced polyneuropathy: Secondary | ICD-10-CM

## 2020-03-07 DIAGNOSIS — I959 Hypotension, unspecified: Secondary | ICD-10-CM | POA: Diagnosis not present

## 2020-03-07 DIAGNOSIS — R509 Fever, unspecified: Secondary | ICD-10-CM | POA: Diagnosis not present

## 2020-03-07 DIAGNOSIS — E876 Hypokalemia: Secondary | ICD-10-CM

## 2020-03-07 DIAGNOSIS — T50995A Adverse effect of other drugs, medicaments and biological substances, initial encounter: Secondary | ICD-10-CM | POA: Diagnosis not present

## 2020-03-07 DIAGNOSIS — D709 Neutropenia, unspecified: Secondary | ICD-10-CM | POA: Diagnosis not present

## 2020-03-07 DIAGNOSIS — G8929 Other chronic pain: Secondary | ICD-10-CM | POA: Diagnosis not present

## 2020-03-07 DIAGNOSIS — R5081 Fever presenting with conditions classified elsewhere: Secondary | ICD-10-CM | POA: Diagnosis not present

## 2020-03-07 DIAGNOSIS — Z95828 Presence of other vascular implants and grafts: Secondary | ICD-10-CM

## 2020-03-07 DIAGNOSIS — G629 Polyneuropathy, unspecified: Secondary | ICD-10-CM | POA: Diagnosis not present

## 2020-03-07 DIAGNOSIS — N390 Urinary tract infection, site not specified: Secondary | ICD-10-CM | POA: Diagnosis not present

## 2020-03-07 DIAGNOSIS — M87180 Osteonecrosis due to drugs, jaw: Secondary | ICD-10-CM | POA: Diagnosis not present

## 2020-03-07 DIAGNOSIS — M549 Dorsalgia, unspecified: Secondary | ICD-10-CM | POA: Diagnosis not present

## 2020-03-07 LAB — CBC WITH DIFFERENTIAL/PLATELET
Abs Immature Granulocytes: 0.02 10*3/uL (ref 0.00–0.07)
Basophils Absolute: 0.1 10*3/uL (ref 0.0–0.1)
Basophils Relative: 1 %
Eosinophils Absolute: 0.1 10*3/uL (ref 0.0–0.5)
Eosinophils Relative: 2 %
HCT: 32.1 % — ABNORMAL LOW (ref 36.0–46.0)
Hemoglobin: 10.9 g/dL — ABNORMAL LOW (ref 12.0–15.0)
Immature Granulocytes: 0 %
Lymphocytes Relative: 26 %
Lymphs Abs: 1.2 10*3/uL (ref 0.7–4.0)
MCH: 29.5 pg (ref 26.0–34.0)
MCHC: 34 g/dL (ref 30.0–36.0)
MCV: 87 fL (ref 80.0–100.0)
Monocytes Absolute: 0.3 10*3/uL (ref 0.1–1.0)
Monocytes Relative: 7 %
Neutro Abs: 3 10*3/uL (ref 1.7–7.7)
Neutrophils Relative %: 64 %
Platelets: 193 10*3/uL (ref 150–400)
RBC: 3.69 MIL/uL — ABNORMAL LOW (ref 3.87–5.11)
RDW: 13.7 % (ref 11.5–15.5)
WBC: 4.7 10*3/uL (ref 4.0–10.5)
nRBC: 0 % (ref 0.0–0.2)

## 2020-03-07 LAB — RESP PANEL BY RT PCR (RSV, FLU A&B, COVID)
Influenza A by PCR: NEGATIVE
Influenza B by PCR: NEGATIVE
Respiratory Syncytial Virus by PCR: NEGATIVE
SARS Coronavirus 2 by RT PCR: NEGATIVE

## 2020-03-07 LAB — MAGNESIUM: Magnesium: 1.1 mg/dL — ABNORMAL LOW (ref 1.7–2.4)

## 2020-03-07 MED ORDER — PROMETHAZINE HCL 25 MG/ML IJ SOLN
25.0000 mg | Freq: Once | INTRAMUSCULAR | Status: AC
  Administered 2020-03-07: 25 mg via INTRAVENOUS
  Filled 2020-03-07: qty 1

## 2020-03-07 MED ORDER — SODIUM CHLORIDE 0.9 % IV SOLN
6.0000 g | Freq: Once | INTRAVENOUS | Status: AC
  Administered 2020-03-07: 6 g via INTRAVENOUS
  Filled 2020-03-07: qty 12

## 2020-03-07 MED ORDER — HEPARIN SOD (PORK) LOCK FLUSH 100 UNIT/ML IV SOLN
500.0000 [IU] | Freq: Once | INTRAVENOUS | Status: DC
  Filled 2020-03-07: qty 5

## 2020-03-07 MED ORDER — POTASSIUM CHLORIDE 20 MEQ/100ML IV SOLN
20.0000 meq | Freq: Once | INTRAVENOUS | Status: AC
  Administered 2020-03-07: 20 meq via INTRAVENOUS
  Filled 2020-03-07 (×2): qty 100

## 2020-03-07 MED ORDER — HEPARIN SOD (PORK) LOCK FLUSH 100 UNIT/ML IV SOLN
INTRAVENOUS | Status: AC
  Filled 2020-03-07: qty 5

## 2020-03-07 MED ORDER — HEPARIN SOD (PORK) LOCK FLUSH 100 UNIT/ML IV SOLN
500.0000 [IU] | Freq: Once | INTRAVENOUS | Status: AC | PRN
  Administered 2020-03-07: 500 [IU]
  Filled 2020-03-07: qty 5

## 2020-03-07 MED ORDER — SODIUM CHLORIDE 0.9% FLUSH
10.0000 mL | INTRAVENOUS | Status: DC | PRN
  Administered 2020-03-07: 10 mL via INTRAVENOUS
  Filled 2020-03-07: qty 10

## 2020-03-07 MED ORDER — SODIUM CHLORIDE 0.9 % IV SOLN: Freq: Once | INTRAVENOUS | Status: DC

## 2020-03-07 MED ORDER — SODIUM CHLORIDE 0.9 % IV SOLN
Freq: Once | INTRAVENOUS | Status: AC
  Filled 2020-03-07: qty 250

## 2020-03-07 NOTE — Progress Notes (Signed)
Patient arrived to the [facility] today at 8:15 am with port a cath already accessed. Transparent dressing and antimicrobial disc still in place. Line attached to needle had small amount of visible, dried blood in it. (Patient was discharged from [facility] on 03/05/20, and needle was left in at that time. There is no documentation of a heparin flush at the time of discharge.) Needle removed at 8:25 am today, due to dried blood in the line. Port was re accessed and flushed at 8:27 am using sterile technique. Port functioned as expected and blood was obtained. Port was flushed per protocol.

## 2020-03-07 NOTE — Telephone Encounter (Signed)
[facility] requests new order for Pomalyst with cell genetic information can be called or faxed.

## 2020-03-07 NOTE — Telephone Encounter (Signed)
  Please send new Rx  M

## 2020-03-07 NOTE — Progress Notes (Signed)
Nausea has been several  and abdomen pain(5) with heartburns. The patient repost she choked on a piece of steak 2 weeks ago.

## 2020-03-08 ENCOUNTER — Telehealth: Payer: Self-pay

## 2020-03-08 ENCOUNTER — Ambulatory Visit: Payer: Medicare PPO | Admitting: Physical Therapy

## 2020-03-08 ENCOUNTER — Other Ambulatory Visit: Payer: Self-pay

## 2020-03-08 DIAGNOSIS — C9000 Multiple myeloma not having achieved remission: Secondary | ICD-10-CM

## 2020-03-08 LAB — COMPREHENSIVE METABOLIC PANEL
ALT: 26 U/L (ref 0–44)
AST: 32 U/L (ref 15–41)
Albumin: 4.3 g/dL (ref 3.5–5.0)
Alkaline Phosphatase: 69 U/L (ref 38–126)
Anion gap: 15 (ref 5–15)
BUN: 31 mg/dL — ABNORMAL HIGH (ref 8–23)
CO2: 23 mmol/L (ref 22–32)
Calcium: 9.4 mg/dL (ref 8.9–10.3)
Chloride: 99 mmol/L (ref 98–111)
Creatinine, Ser: 1.17 mg/dL — ABNORMAL HIGH (ref 0.44–1.00)
GFR calc Af Amer: 57 mL/min — ABNORMAL LOW (ref >60)
GFR calc non Af Amer: 50 mL/min — ABNORMAL LOW (ref >60)
Glucose, Bld: 139 mg/dL — ABNORMAL HIGH (ref 70–99)
Potassium: 3.3 mmol/L — ABNORMAL LOW (ref 3.5–5.1)
Sodium: 137 mmol/L (ref 135–145)
Total Bilirubin: 0.4 mg/dL (ref 0.3–1.2)
Total Protein: 7.1 g/dL (ref 6.5–8.1)

## 2020-03-08 LAB — KAPPA/LAMBDA LIGHT CHAINS
Kappa free light chain: 4.1 mg/L (ref 3.3–19.4)
Kappa, lambda light chain ratio: 0.31 (ref 0.26–1.65)
Lambda free light chains: 13.1 mg/L (ref 5.7–26.3)

## 2020-03-08 MED ORDER — POMALIDOMIDE 3 MG PO CAPS: 3.0000 mg | ORAL_CAPSULE | Freq: Every day | ORAL | 1 refills | Status: AC

## 2020-03-08 NOTE — Telephone Encounter (Signed)
I spoke with the patient to inform her that her calcium levels was normal. The patient was understanding and agreeable.

## 2020-03-08 NOTE — Telephone Encounter (Signed)
  Is Ms Grafton set?  M

## 2020-03-08 NOTE — Telephone Encounter (Signed)
-----   Message from Lequita Asal, MD sent at 03/08/2020 12:31 PM EDT ----- Regarding: Please call patient  Calcium level is good.  M ----- Message ----- From: Buel Ream, Lab [facility] [place] Sent: 03/07/2020   8:39 AM EDT To: Lequita Asal, MD

## 2020-03-09 ENCOUNTER — Inpatient Hospital Stay: Payer: Medicare PPO

## 2020-03-09 ENCOUNTER — Telehealth: Payer: Self-pay | Admitting: Internal Medicine

## 2020-03-09 ENCOUNTER — Other Ambulatory Visit: Payer: Self-pay

## 2020-03-09 VITALS — BP 134/80 | HR 88 | Temp 98.4°F | Resp 18

## 2020-03-09 DIAGNOSIS — E538 Deficiency of other specified B group vitamins: Secondary | ICD-10-CM

## 2020-03-09 DIAGNOSIS — N289 Disorder of kidney and ureter, unspecified: Secondary | ICD-10-CM

## 2020-03-09 DIAGNOSIS — Z5112 Encounter for antineoplastic immunotherapy: Secondary | ICD-10-CM | POA: Diagnosis not present

## 2020-03-09 DIAGNOSIS — D709 Neutropenia, unspecified: Secondary | ICD-10-CM | POA: Diagnosis not present

## 2020-03-09 DIAGNOSIS — C9000 Multiple myeloma not having achieved remission: Secondary | ICD-10-CM

## 2020-03-09 DIAGNOSIS — D649 Anemia, unspecified: Secondary | ICD-10-CM | POA: Diagnosis not present

## 2020-03-09 DIAGNOSIS — R5081 Fever presenting with conditions classified elsewhere: Secondary | ICD-10-CM | POA: Diagnosis not present

## 2020-03-09 DIAGNOSIS — M87180 Osteonecrosis due to drugs, jaw: Secondary | ICD-10-CM | POA: Diagnosis not present

## 2020-03-09 DIAGNOSIS — D801 Nonfamilial hypogammaglobulinemia: Secondary | ICD-10-CM

## 2020-03-09 DIAGNOSIS — T50995A Adverse effect of other drugs, medicaments and biological substances, initial encounter: Secondary | ICD-10-CM | POA: Diagnosis not present

## 2020-03-09 DIAGNOSIS — Z1152 Encounter for screening for COVID-19: Secondary | ICD-10-CM | POA: Diagnosis not present

## 2020-03-09 DIAGNOSIS — R809 Proteinuria, unspecified: Secondary | ICD-10-CM | POA: Diagnosis not present

## 2020-03-09 LAB — PROTEIN ELECTROPHORESIS, SERUM
A/G Ratio: 1.7 (ref 0.7–1.7)
Albumin ELP: 3.9 g/dL (ref 2.9–4.4)
Alpha-1-Globulin: 0.3 g/dL (ref 0.0–0.4)
Alpha-2-Globulin: 0.9 g/dL (ref 0.4–1.0)
Beta Globulin: 0.8 g/dL (ref 0.7–1.3)
Gamma Globulin: 0.4 g/dL (ref 0.4–1.8)
Globulin, Total: 2.3 g/dL (ref 2.2–3.9)
M-Spike, %: 0.1 g/dL — ABNORMAL HIGH
Total Protein ELP: 6.2 g/dL (ref 6.0–8.5)

## 2020-03-09 LAB — MAGNESIUM: Magnesium: 1.6 mg/dL — ABNORMAL LOW (ref 1.7–2.4)

## 2020-03-09 MED ORDER — MAGNESIUM SULFATE 2 GM/50ML IV SOLN
2.0000 g | Freq: Once | INTRAVENOUS | Status: AC
  Administered 2020-03-09: 2 g via INTRAVENOUS
  Filled 2020-03-09: qty 50

## 2020-03-09 MED ORDER — PROMETHAZINE HCL 25 MG/ML IJ SOLN
25.0000 mg | Freq: Once | INTRAMUSCULAR | Status: AC
  Administered 2020-03-09: 25 mg via INTRAVENOUS
  Filled 2020-03-09: qty 1

## 2020-03-09 MED ORDER — HEPARIN SOD (PORK) LOCK FLUSH 100 UNIT/ML IV SOLN
500.0000 [IU] | Freq: Once | INTRAVENOUS | Status: AC | PRN
  Administered 2020-03-09: 500 [IU]
  Filled 2020-03-09: qty 5

## 2020-03-09 MED ORDER — SODIUM CHLORIDE 0.9 % IV SOLN
Freq: Once | INTRAVENOUS | Status: AC
  Filled 2020-03-09: qty 250

## 2020-03-09 MED ORDER — SODIUM CHLORIDE 0.9% FLUSH
10.0000 mL | Freq: Once | INTRAVENOUS | Status: AC | PRN
  Administered 2020-03-09: 10 mL
  Filled 2020-03-09: qty 10

## 2020-03-09 NOTE — Telephone Encounter (Signed)
Claretta Fraise 03/09/2020 Called pt regarding community resource referral received. Left message for pt to call me back, my info is [phone] please see ref notes for more details.  [place], Care Management

## 2020-03-09 NOTE — Progress Notes (Signed)
Nutrition Follow-up:  Patient with multiple myeloma currently on treatment.  Noted recent hospital admission (9/1-9/5) for UTI, cough.    Spoke with patient via phone.  Patient reports that her stomach is rejecting solid foods. Reports cramping also got choked on piece of steak prior to going into the hospital.  Reports that she is waiting on getting an appointment with Dr Vicente Males. Reports esophagus was been stretched in the past.  Reports some issues with constipation.  Reports liquids are working better for her right now.  Last night ate chicken noodle soup for dinner, can't remember what else yesterday.  This am ate berries with cake and whipped cream.  Drinking boost shake and premier protein.      Medications: reviewed  Labs: reviewed  Anthropometrics:   Weight 141 lb (admission weight)   NUTRITION DIAGNOSIS: Inadequate oral intake continues   INTERVENTION:  Encouraged higher calorie shakes more often especially if unable to tolerate solid foods at this time.   Encouraged chopping foods, adding moisture for ease of swallowing and extra calories.   Discussed ways to add more calories and protein in diet with reduced intake Encouraged follow-up with GI.     MONITORING, EVALUATION, GOAL: weigh trend, intake   NEXT VISIT: October 7 phone f/u  Sarah [name], [place], [place] Registered Dietitian [phone] (mobile)

## 2020-03-09 NOTE — Progress Notes (Signed)
[facility]  [address] [place], [place] [address] Phone: [phone]  Fax: [phone]   Clinic Day:  03/13/2020   Referring physician: Glean Hess, MD  Chief Complaint: Sarah [name] is a 64 y.o. 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   HPI: The patient was last seen in the medical oncology clinic on 03/07/2020. At that time, she had a cough, abdominal discomfort and constipation after recent hospitalization.  Exam revealed scattered crackles and wheezes.  Hematocrit was 32.1, hemoglobin 10.9, MCV 87.0, platelets 193,000, WBC 4,700 (ANC 300).  M spike was 0.1 g/dL. Kappa free light chain was 4.1, lambda light chain 13.1, ratio 0.31.  Magnesium was 1.1 and potassium 3.3. Chemotherapy was held.  She received magnesium 6 gm IV and Potassium Chloride 20 meq.  CXR revealed no acute cardiopulmonary disease.  Influenza A, influenza B, RSV, and COVID-19 were negative.  Magnesium was 1.6 on 03/09/2020. The patient received 2 g IV Magnesium and IV Phenergan.  During the interim, she feels "better". She still has a cough, but it has improved. She is still nauseous, which is normal for her. She has abdominal soreness and is constipated. She has been taking stool softeners and trying to eat more fiber. She currently has a vaginal yeast infection. Her neuropathy is stable and she reports sharp pains in her toes. Her diabetes was better in the hospital because she is now craving sugary foods. She denies fever.  The patient took Prilosec and Prozac this morning and her Singulair yesterday. She did not take any other medications this morning because she wanted to make sure she was getting treatment.   She is seeing Dr. Clydene Laming on 04/04/2020 for a second evaluation for transplant.   Past Medical History:  Diagnosis Date  .  Abnormal stress test 02/14/2016   Overview:  Added automatically from request for surgery [id number]  . Anemia   . Anxiety   . Arthritis   . Bicuspid aortic valve   . CHF (congestive heart failure) ([place])   . CKD (chronic kidney disease) stage 3, GFR 30-59 ml/min   . Depression   . Diabetes mellitus ([place])   . Dizziness   . Fatty liver   . Frequent falls   . GERD (gastroesophageal reflux disease)   . Gout   . Heart murmur   . History of blood transfusion   . History of bone marrow transplant ([place])   . History of uterine fibroid   . Hx of cardiac catheterization 06/05/2016   Overview:  Normal coronaries 2017  . Hypertension   . Hypomagnesemia   . Multiple myeloma ([place])   . Personal history of chemotherapy   . Renal cyst     Past Surgical History:  Procedure Laterality Date  . ABDOMINAL HYSTERECTOMY    . 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 CARDIAC ELECTROPHYSIOLOGY MAPPING AND ABLATION    . CARPAL TUNNEL RELEASE Bilateral   . CHOLECYSTECTOMY  2008  . COLONOSCOPY WITH PROPOFOL N/A 05/07/2017   Procedure: COLONOSCOPY WITH PROPOFOL;  Surgeon: [name], MD;  Location: [facility] ENDOSCOPY;  Service: Gastroenterology;  Laterality: N/A;  . ESOPHAGOGASTRODUODENOSCOPY (EGD) WITH PROPOFOL N/A 05/07/2017   Procedure: ESOPHAGOGASTRODUODENOSCOPY (EGD) WITH PROPOFOL;  Surgeon: [name], MD;  Location: [facility] ENDOSCOPY;  Service: Gastroenterology;  Laterality: N/A;  . FOOT SURGERY Bilateral   . INCONTINENCE SURGERY  2009  . INTERSTIM IMPLANT PLACEMENT    . other     over active bladder  . OTHER SURGICAL HISTORY     bladder stimulator   . PARTIAL HYSTERECTOMY  03/1996   fibroids  . PORTA CATH INSERTION N/A 03/10/2019   Procedure: PORTA CATH INSERTION;  Surgeon: Algernon Huxley, MD;  Location: [facility];  Service: Cardiovascular;  Laterality: N/A;  . TONSILLECTOMY  2007    Family History  Problem Relation Age of Onset  . Colon cancer Father   . Renal Disease Father   . Diabetes  Mellitus II Father   . Melanoma Paternal Grandmother   . Breast cancer Maternal Aunt 86  . Anemia Mother   . Heart disease Mother   . Heart failure Mother   . Renal Disease Mother   . Congestive Heart Failure Mother   . Heart disease Maternal Uncle   . Throat cancer Maternal Uncle   . Lung cancer Maternal Uncle   . Liver disease Maternal Uncle   . Heart failure Maternal Uncle   . Hearing loss Son 74       Suicide     Social History:  reports that she quit smoking about 28 years ago. Her smoking use included cigarettes. She has a 20.00 pack-year smoking history. She has never used smokeless tobacco. She reports current alcohol use. She reports that she does not use drugs. Patient has not had ETOH in several months. She is on disability. She notes exposure to perchloroethylene [facility]).She lives much of her adult life in [place]. She was married with 2 sons. Her husband passed away. Her 2 sons took their own lives (age 13 and 54). She worked in [employer] in [employer]. She went back to school and earned an [facility] and works for [employer] for several years.She liveswith her sistersin [place]. The patient is alone today.   Allergies:  Allergies  Allergen Reactions  . Oxycodone-Acetaminophen Anaphylaxis    Swelling and rash  . Celebrex [Celecoxib] Diarrhea  . Codeine   . Plerixafor     In 2016 during ASCT collection patient developed fever to 103.57F and required hospitalization  . Benadryl [Diphenhydramine] Palpitations  . Morphine Itching and Rash  . Ondansetron Diarrhea  . Tylenol [Acetaminophen] Itching and Rash    Current Medications: Current Outpatient Medications  Medication Sig Dispense Refill  . acetaminophen (TYLENOL) 500 MG tablet Take 500 mg by mouth every 6 (six) hours as needed.     Marland Kitchen acyclovir (ZOVIRAX) 400 MG tablet Take 1 tablet (400 mg total) by mouth 2 (two) times daily. 60 tablet 11  . allopurinol (ZYLOPRIM) 100 MG tablet TAKE 1  TABLET(100 MG) BY MOUTH DAILY as needed 90 tablet 1  . aspirin 81 MG chewable tablet Chew 1 tablet (81 mg total) by mouth daily. 30 tablet 0  . dexamethasone (DECADRON) 4 MG tablet Take 5 tablets (51m) on days 2, 16 of cycle 3,4, 5 and 6 Take 5 tablets (264m on days 2 of cycles 7 and beyond (Patient not taking: Reported on 03/07/2020) 50 tablet 0  . diclofenac sodium (VOLTAREN) 1 % GEL Apply 2 g topically 4 (four) times daily. 100 g 1  . diphenhydrAMINE (BENADRYL) 50 MG capsule Take 50 mg by mouth as needed.     . diphenoxylate-atropine (LOMOTIL) 2.5-0.025 MG tablet Take 2 tablets by mouth daily as needed for diarrhea or loose stools. (Patient not taking: Reported on 03/01/2020) 45 tablet 2  . fentaNYL (  DURAGESIC - DOSED MCG/HR) 25 MCG/HR patch Place 25 mcg onto the skin every 3 (three) days.     . fexofenadine (ALLEGRA) 180 MG tablet TAKE 1 TABLET(180 MG) BY MOUTH DAILY (Patient not taking: Reported on 03/07/2020) 30 tablet 5  . FLUoxetine (PROZAC) 40 MG capsule TAKE 1 CAPSULE EVERY DAY 90 capsule 1  . fluticasone (FLONASE) 50 MCG/ACT nasal spray Place 2 sprays into both nostrils daily. 16 g 2  . glucose blood (ONE TOUCH ULTRA TEST) test strip     . lactulose (CHRONULAC) 10 GM/15ML solution Take 20 g by mouth daily as needed.     . lidocaine-prilocaine (EMLA) cream APPLY TOPICALLY AS NEEDED. 30 g 0  . lisinopril (ZESTRIL) 10 MG tablet Take 10 mg by mouth once.  (Patient not taking: Reported on 03/01/2020)    . metFORMIN (GLUCOPHAGE-XR) 500 MG 24 hr tablet TAKE 1 TABLET (500 MG TOTAL) BY MOUTH DAILY WITH BREAKFAST. 90 tablet 1  . montelukast (SINGULAIR) 10 MG tablet Take 1 tablet by mouth on the day before treatment and for 2 days after treatment. (Patient not taking: Reported on 03/07/2020) 30 tablet 0  . Multiple Vitamin (MULTIVITAMIN) tablet Take 1 tablet by mouth daily.    . Multiple Vitamins-Minerals (HAIR/SKIN/NAILS) TABS Take 1 tablet by mouth daily.    Marland Kitchen NARCAN 4 MG/0.1ML LIQD nasal spray kit 1  spray.  (Patient not taking: Reported on 03/01/2020)    . omeprazole (PRILOSEC) 40 MG capsule TAKE 1 CAPSULE EVERY DAY 90 capsule 3  . pomalidomide (POMALYST) 3 MG capsule Take 1 capsule (3 mg total) by mouth daily for 14 days. [employer] Auth # [id number]    Date Obtained 03/08/2020   Take 1 cap po daily for 14 days and 14 days off. 14 capsule 1  . promethazine (PHENERGAN) 25 MG tablet Take 1 tablet (25 mg total) by mouth every 6 (six) hours as needed for nausea or vomiting. 90 tablet 0  . promethazine-dextromethorphan (PROMETHAZINE-DM) 6.25-15 MG/5ML syrup Take 5 mLs by mouth 4 (four) times daily as needed for cough. (Patient not taking: Reported on 02/21/2020) 240 mL 0  . promethazine-dextromethorphan (PROMETHAZINE-DM) 6.25-15 MG/5ML syrup Take 5 mLs by mouth every 8 (eight) hours as needed for cough. 240 mL 0  . tiZANidine (ZANAFLEX) 4 MG tablet Take 1 tablet (4 mg total) by mouth every 8 (eight) hours as needed for muscle spasms. 270 tablet 1  . traZODone (DESYREL) 100 MG tablet TAKE 1 TABLET AT BEDTIME  FOR  SLEEP 90 tablet 0   No current facility-administered medications for this visit.   Facility-Administered Medications Ordered in Other Visits  Medication Dose Route Frequency Provider Last Rate Last Admin  . 0.9 %  sodium chloride infusion   Intravenous Continuous Lequita Asal, MD   Stopped at 01/20/20 1232  . heparin lock flush 100 unit/mL  500 Units Intravenous Once Faythe Casa E, NP      . sodium chloride flush (NS) 0.9 % injection 10 mL  10 mL Intravenous PRN Lequita Asal, MD   10 mL at 02/07/20 0815  . sodium chloride flush (NS) 0.9 % injection 10 mL  10 mL Intracatheter PRN Cammie Sickle, MD   10 mL at 02/10/20 0815  . sodium chloride flush (NS) 0.9 % injection 10 mL  10 mL Intravenous PRN Jacquelin Hawking, NP   10 mL at 03/01/20 1143    Review of Systems  Constitutional: Negative for chills, diaphoresis, fever and malaise/fatigue.  Feels "better."  HENT:  Negative for congestion, ear discharge, ear pain, hearing loss, nosebleeds, sinus pain, sore throat and tinnitus.   Eyes: Negative.  Negative for blurred vision, double vision and photophobia.  Respiratory: Positive for cough (improving). Negative for hemoptysis, sputum production, shortness of breath and wheezing.   Cardiovascular: Negative.  Negative for chest pain, palpitations and leg swelling.  Gastrointestinal: Positive for abdominal pain, constipation, heartburn (on Prilosec) and nausea. Negative for blood in stool, diarrhea, melena and vomiting.       Craving sugary foods.  Genitourinary: Negative.  Negative for dysuria, frequency and urgency.  Musculoskeletal: Positive for back pain (chronic; paraspinal tenderness; upper back). Negative for falls, joint pain, myalgias and neck pain.  Skin: Negative.  Negative for itching and rash.  Neurological: Positive for sensory change (chronic numbness in fingers and toes, stable; sharp pain in toes). Negative for dizziness, tingling, weakness and headaches.  Endo/Heme/Allergies: Negative for environmental allergies. Does not bruise/bleed easily.       Diabetes.  Psychiatric/Behavioral: Negative.  Negative for memory loss. The patient is not nervous/anxious and does not have insomnia.   All other systems reviewed and are negative.   Performance status (ECOG): 1  Vitals Blood pressure 118/79, pulse 87, temperature 97.9 F (36.6 C), temperature source Tympanic, resp. rate 18, height 5' 3"  (1.6 m), weight 140 lb 10.5 oz (63.8 kg), SpO2 99 %.  Physical Exam Vitals and nursing note reviewed.  Constitutional:      General: She is not in acute distress.    Appearance: She is well-developed. She is not diaphoretic.     Interventions: Face mask in place.  HENT:     Head: Normocephalic and atraumatic.     Comments: Short curly blonde hair.    Mouth/Throat:     Mouth: Mucous membranes are moist.     Pharynx: Oropharynx is clear.  Eyes:      General: No scleral icterus.    Extraocular Movements: Extraocular movements intact.     Conjunctiva/sclera: Conjunctivae normal.     Pupils: Pupils are equal, round, and reactive to light.     Comments: Glasses. Brown eyes.   Cardiovascular:     Rate and Rhythm: Normal rate and regular rhythm.     Heart sounds: Normal heart sounds. No murmur heard.   Pulmonary:     Effort: Pulmonary effort is normal. No respiratory distress.     Breath sounds: Normal breath sounds. No wheezing or rales.  Chest:     Chest wall: No tenderness.  Abdominal:     General: Bowel sounds are normal. There is no distension.     Palpations: Abdomen is soft. There is no hepatomegaly, splenomegaly or mass.     Tenderness: There is no abdominal tenderness. There is no guarding or rebound.  Musculoskeletal:        General: No swelling or tenderness. Normal range of motion.     Cervical back: Normal range of motion and neck supple.  Lymphadenopathy:     Head:     Right side of head: No preauricular, posterior auricular or occipital adenopathy.     Left side of head: No preauricular, posterior auricular or occipital adenopathy.     Cervical: No cervical adenopathy.     Upper Body:     Right upper body: No supraclavicular or axillary adenopathy.     Left upper body: No supraclavicular or axillary adenopathy.     Lower Body: No right inguinal adenopathy. No left inguinal adenopathy.  Skin:    General: Skin is warm and dry.  Neurological:     Mental Status: She is alert and oriented to person, place, and time. Mental status is at baseline.  Psychiatric:        Behavior: Behavior normal.        Thought Content: Thought content normal.        Judgment: Judgment normal.    Infusion on 03/13/2020  Component Date Value Ref Range Status  . Magnesium 03/13/2020 1.1* 1.7 - 2.4 mg/dL Final   Performed at [facility], [address], [place], [place] [address]  . Sodium 03/13/2020 137  135 - 145 mmol/L  Final  . Potassium 03/13/2020 3.9  3.5 - 5.1 mmol/L Final  . Chloride 03/13/2020 102  98 - 111 mmol/L Final  . CO2 03/13/2020 25  22 - 32 mmol/L Final  . Glucose, Bld 03/13/2020 119* 70 - 99 mg/dL Final   Glucose reference range applies only to samples taken after fasting for at least 8 hours.  . BUN 03/13/2020 23  8 - 23 mg/dL Final  . Creatinine, Ser 03/13/2020 1.06* 0.44 - 1.00 mg/dL Final  . Calcium 03/13/2020 8.9  8.9 - 10.3 mg/dL Final  . Total Protein 03/13/2020 6.8  6.5 - 8.1 g/dL Final  . Albumin 03/13/2020 4.2  3.5 - 5.0 g/dL Final  . AST 03/13/2020 24  15 - 41 U/L Final  . ALT 03/13/2020 23  0 - 44 U/L Final  . Alkaline Phosphatase 03/13/2020 74  38 - 126 U/L Final  . Total Bilirubin 03/13/2020 0.1* 0.3 - 1.2 mg/dL Final  . GFR calc non Af Amer 03/13/2020 56* >60 mL/min Final  . GFR calc Af Amer 03/13/2020 >60  >60 mL/min Final  . Anion gap 03/13/2020 10  5 - 15 Final   Performed at [facility], [address] [place],  [address]  . WBC 03/13/2020 4.4  4.0 - 10.5 K/uL Final  . RBC 03/13/2020 3.69* 3.87 - 5.11 MIL/uL Final  . Hemoglobin 03/13/2020 10.7* 12.0 - 15.0 g/dL Final  . HCT 03/13/2020 31.9* 36 - 46 % Final  . MCV 03/13/2020 86.4  80.0 - 100.0 fL Final  . MCH 03/13/2020 29.0  26.0 - 34.0 pg Final  . MCHC 03/13/2020 33.5  30.0 - 36.0 g/dL Final  . RDW 03/13/2020 14.0  11.5 - 15.5 % Final  . Platelets 03/13/2020 167  150 - 400 K/uL Final  . nRBC 03/13/2020 0.0  0.0 - 0.2 % Final  . Neutrophils Relative % 03/13/2020 62  % Final  . Neutro Abs 03/13/2020 2.7  1.7 - 7.7 K/uL Final  . Lymphocytes Relative 03/13/2020 28  % Final  . Lymphs Abs 03/13/2020 1.2  0.7 - 4.0 K/uL Final  . Monocytes Relative 03/13/2020 7  % Final  . Monocytes Absolute 03/13/2020 0.3  0 - 1 K/uL Final  . Eosinophils Relative 03/13/2020 2  % Final  . Eosinophils Absolute 03/13/2020 0.1  0 - 0 K/uL Final  . Basophils Relative 03/13/2020 1  % Final  . Basophils Absolute  03/13/2020 0.1  0 - 0 K/uL Final  . Immature Granulocytes 03/13/2020 0  % Final  . Abs Immature Granulocytes 03/13/2020 0.01  0.00 - 0.07 K/uL Final   Performed at [facility], [address], [place],  [address]    Assessment:  Sarah [name] is a 64 y.o. female with stage III IgA lambda light chain multiple  myeloma s/p autologous stem cell transplant in 06/14/2015 at the [facility]. Bone marrow revealed 80% plasma cells. Lambda free light chains were 1340. She had nephrotic range proteinuria. She initially underwent induction with RVD. Revlimid maintenance was discontinued on 01/21/2017 secondary to intolerance.   Bone marrowaspirate andbiopsyon 01/18/2021revealed anormocellularmarrow withbut increased lambda-restricted plasma cells (9% aspirate, 40% CD138 immunohistochemistry).Findingswereconsistent with recurrent plasma cell myeloma.Flow cytometry revealed no monoclonal B-cell or phenotypically aberrant T-cell population. Cytogeneticswere 69, XX (normal). 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  M-spikehas been followed: 0 on 04/02/2016 -06/23/2019; 0.2 on 09/02/2017, 0.1 on 11/25/2019, 0.1 on 01/10/2020, 0.2 on 02/07/2020, and 0.1 on 03/07/2020.  Lambda light chainshave been followed: 22.2 (ratio 0.56) on 07/03/2017, 30.8 (ratio 0.78) on 09/02/2017, 36.9 (ratio 0.40) on 10/21/2017, 37.4 (ratio 0.41) on 12/16/2017, 70.7(ratio 0.31) on 02/17/2018, 64.2 (ratio 0.27) on 04/07/2018, 78.9 (ratio 0.18) on 05/26/2018, 128.8 (ratio 0.17) on 08/06/2018, 181.5 (ratio 0.13) on 10/08/2018, 130.9 (ratio 0.13) on 10/20/2018, 160.7 (ratio 0.10)on 12/09/2018, 236.6 (ratio 0.07) on 02/01/2019, 363.6 (ratio 0.04) on 03/22/2019, 404.8 (ratio 0.04) on 04/05/2019, 420.7 (ratio 0.03) on 05/24/2019, 573.4 (ratio 0.03) on 06/23/2019, 451.05(ratio 0.02) on02/19/2021, 47.2 (ratio 0.13) on 10/25/2019, 22.4 (ratio 0.21) on 11/25/2019, 16.5 (ratio  0.33) on 01/10/2020, 14.6 (ratio 0.34) on 02/07/2020, and 13.1 (ratio 0.31) on 03/07/2020.  24 hour UPEPon 06/03/2019 revealed kappa free light chains95.76,lambda free light chains1,260.71, andratio 0.08.24 hourUPEPon 02/22/2021revealed totalproteinof733m/24 hrs withlambdafree light chains 1,084.149mL andratioof0.10(1.03-31.76). M spike inurinewas46.1%(36159m4 hrs).  Bone surveyon 04/08/2016 and11/28/2018 revealed no definite lytic lesion seen in the visualized skeleton.Bone surveyon 11/19/2018 revealed no suspicious lucent lesionsand no acute bony abnormality.PET scanon 07/12/2019 revealed no focal metabolic activity to suggest active myeloma within the skeleton. There wereno lytic lesions identified on the CT portion of the examorsoft tissue plasmacytomas. There was no evidence of multiple myeloma.  Pretreatment RBC phenotypeon 09/23/2019 waspositivefor C, e, DUFFY B, KIDD B, M, S, and s antigen; negativefor c, E, KELL, DUFFY A, KIDD A, and N antigen.  Sheis day 1 ofcycle #6 daratumumab and hyaluronidase-fihj, Pomalyst, and Decadron (DPd)(09/27/2019 - 10/25/2019; 12/09/2019 - 03/13/2020). Cycle #1 was complicated by fever and neutropenia requiring admission.  Cycle #2 was complicated by pneumonia requiring admission. Cycle #6 was delayed 1 week secondary to a hospitalization.  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 Zometa was discontinued in 01/2017. She has chronic nauseaon Phenergan.  She has B12 deficiency. B12 was 254 on 04/09/2017, 295 on 08/20/2018, and 391 on 10/08/2018. Shewasonoral B12.She received B12 monthly (last08/05/2020).Folate was18.6on 02/08/2019.  She has iron deficiency. Ferritin was 32 on 07/01/2019. She received Venoferon 07/15/2019 and 07/22/2019.  She has hypogammaglobulinemia.  IgG was 245 on 11/25/2019.  She received monthly IVIG (07/22/021 - 02/17/2020).  She received  IVIG 400 mg/kg on 01/20/2020 and 200 mg/kg on 02/17/2020.  IVIG on 07/63/84/6659s complicated by acute renal failure. IgG trough level was 418 on 02/17/2020.  [facility] with fever and neutropenia. Cultures were negative. CXR was negative. She received broad spectrum antibiotics and daily Granix. She received IVF for acute renal insufficiency due to diarrhea and dehydration. Creatine was 1.66 on admission and 1.12 on discharge.  [facility]2020 with fever and neutropenia. CXR revealed no active cardiopulmonary disease. Chest CT with contrast revealed no acute intrathoracic pathology. There were findings which could be suggestive of prior granulomatous disease. She was treated with Cefepime and Vancomycin, then  switched to ciprofloxacin on 03/03/2020.   She received her second COVID-19vaccineon 10/09/2019.  Symptomatically, she is feeling "better".  She has a slight cough.  She has chronic nausea.  She has a vaginal yeast infection.  Exam is stable.  Plan: 1.   Labs today: CBC with diff, CMP, Mg. 2. Stage III multiple myeloma Clinically, she is near baseline. Lambda free light chains have improved from451.05on02/19/2021.                         After cycle #1:  47.2.                         After cycle #5:  13.1. Labs reviewed.  Begin cycle #6 daratumumab, Pomalyst and Decadron. Neuropathy remains stable.   She has chronic fatigue and nausea. Treatment plan for cycles # 4 - 6 (28 day cycle):  Pomalidomide (Pomalyst) 3 mg po q day 1-14. Decadron 20 mg po day 1-2 and 15-16. Daratumumab and hyaluronidase-fihj (Darzalez Faspro) 1800 mg SQ on days 1 and 15. Premeds for [employer]:  Singulair 10 mg  po day before, day of, and 2 days. Phenergan 25 mg IV,Benadryl 50 mg po, andTylenol 650 mg po.             Discussed symptom management.  She has antiemetics at home to use on a as needed basis.  Interventions are adequate.                                       Continue oral Phenergan at home and IV Phenergan in clinic.               Plan follow-up with Dr. Clydene Laming on 04/04/2020 for consideration of second autologous stem cell transplant. 3. Normocytic anemia Hematocrit31.9. Hemoglobin  10.7. MCV86.4.Plateletsare 67,000today. Ferritin51 on 01/10/2020. TSH was normal on02/20/2020.  Creatinine  1.06(stable). 4. Grade II peripheral neuropathy             Neuropathy is stable. 5.B12 deficiency  She receives monthly B12 (last 02/10/2020).              B12 today and monthly x6.  Folate 12.1 on 01/10/2020. 6.   Hypogammaglobulinemia             Patient has had recurrent infections.               She received her first dose of IVIG (25 gm) on 01/20/2020 with subsequent renal insufficiency.             She received her second dose of IVIG (15 gm) on 02/17/2020 without renal insufficiency.              IVIG dose was reduced, infusion time increased and she received pre and post hydration on 02/17/2020 and 02/18/2020.                         Goal IgG trough level is 500.  Discuss plans for IVIG 20 gm on 03/16/2020 with pre and post hydration and increased infusion time.   Patient in agreement 7.   Hypomagnesemia, chronic Magnesium1.1 today.  Magnesium  6 gm IV today. Continue IV magnesium supplementation twice weekly (Mondays and Thursdays).  8.   Chronic GI symptoms  Chronic nausea is managed with Phenergan (po at home and  IV in clinic).             Intermittent constipation managed with stool softeners,  fiber and laxatives. 9.   Vaginal yeast infection             Etiology secondary to recent course of antibiotics.    Rx: fluconazole x3 days. 10.   Day 1 of cycle #6 daratumumab with premeds. 11.   RTC on 09/16 (Thursday) for labs (Mg), IV Mg, IVIG (20 gm) + IVF. 12.   RTC on 09/17 (Friday) in [place] for IVF. 13.   RTC on 09/20 and 09/23 for labs (BMP, Mg) and IV Mg. 14.   RTC on 09/27 for MD assessment, labs (CBC with diff, CMP, Mg), day 15 of daratumumab and IV Mg.  I discussed the assessment and treatment plan with the patient.  The patient was provided an opportunity to ask questions and all were answered.  The patient agreed with the plan and demonstrated an understanding of the instructions.  The patient was advised to call back if the symptoms worsen or if the condition fails to improve as anticipated.   Lequita Asal, MD, PhD    03/13/2020, 9:05 AM   I, Mirian Mo Tufford, am acting as a Education administrator for [employer]. Mike Gip, MD.   I, Melissa C. Mike Gip, MD, have reviewed the above documentation for accuracy and completeness, and I agree with the above.

## 2020-03-10 ENCOUNTER — Telehealth: Payer: Self-pay | Admitting: *Deleted

## 2020-03-10 NOTE — Telephone Encounter (Signed)
Judeen Hammans can you see if anyone from Dr. Lurline Del team can do this? If not we will need to do it. Alyson can you help?

## 2020-03-10 NOTE — Telephone Encounter (Signed)
Pharmacy needs Cell gene authorization for pomalyst 3 mg so be called or faxed to pharmacy.

## 2020-03-10 NOTE — Telephone Encounter (Signed)
[facility]. I looks like the Pomalyst prescription e-scribed to them on 03/06/20 did not have a REMs auth number attached. In Epic a new prescription was entered and printed on 03/08/20 with a REMS number, but [employer] stated they did not receive a fax with that prescription.   Provided [facility] with the [place] # [id number]. They can now proceed with processing the prescription.

## 2020-03-10 NOTE — Telephone Encounter (Signed)
  Another Rx re: Courtney and Roxie.  Neither are here today.  Is there a nurse that will be working with me today to help out?  M

## 2020-03-10 NOTE — Telephone Encounter (Signed)
Thanks, --M

## 2020-03-10 NOTE — Telephone Encounter (Signed)
Thank you :)

## 2020-03-11 DIAGNOSIS — E876 Hypokalemia: Secondary | ICD-10-CM | POA: Insufficient documentation

## 2020-03-13 ENCOUNTER — Ambulatory Visit: Payer: Medicare PPO

## 2020-03-13 ENCOUNTER — Encounter: Payer: Self-pay | Admitting: Hematology and Oncology

## 2020-03-13 ENCOUNTER — Inpatient Hospital Stay (HOSPITAL_BASED_OUTPATIENT_CLINIC_OR_DEPARTMENT_OTHER): Payer: Medicare PPO | Admitting: Hematology and Oncology

## 2020-03-13 ENCOUNTER — Other Ambulatory Visit: Payer: Self-pay

## 2020-03-13 ENCOUNTER — Inpatient Hospital Stay: Payer: Medicare PPO

## 2020-03-13 VITALS — BP 118/79 | HR 87 | Temp 97.9°F | Resp 18 | Ht 63.0 in | Wt 140.7 lb

## 2020-03-13 DIAGNOSIS — B3731 Acute candidiasis of vulva and vagina: Secondary | ICD-10-CM

## 2020-03-13 DIAGNOSIS — R110 Nausea: Secondary | ICD-10-CM

## 2020-03-13 DIAGNOSIS — D801 Nonfamilial hypogammaglobulinemia: Secondary | ICD-10-CM

## 2020-03-13 DIAGNOSIS — R809 Proteinuria, unspecified: Secondary | ICD-10-CM | POA: Diagnosis not present

## 2020-03-13 DIAGNOSIS — Z5112 Encounter for antineoplastic immunotherapy: Secondary | ICD-10-CM | POA: Diagnosis not present

## 2020-03-13 DIAGNOSIS — Z9484 Stem cells transplant status: Secondary | ICD-10-CM

## 2020-03-13 DIAGNOSIS — D649 Anemia, unspecified: Secondary | ICD-10-CM

## 2020-03-13 DIAGNOSIS — T50995A Adverse effect of other drugs, medicaments and biological substances, initial encounter: Secondary | ICD-10-CM | POA: Diagnosis not present

## 2020-03-13 DIAGNOSIS — E538 Deficiency of other specified B group vitamins: Secondary | ICD-10-CM | POA: Diagnosis not present

## 2020-03-13 DIAGNOSIS — Z1152 Encounter for screening for COVID-19: Secondary | ICD-10-CM | POA: Diagnosis not present

## 2020-03-13 DIAGNOSIS — C9000 Multiple myeloma not having achieved remission: Secondary | ICD-10-CM

## 2020-03-13 DIAGNOSIS — B373 Candidiasis of vulva and vagina: Secondary | ICD-10-CM | POA: Diagnosis not present

## 2020-03-13 DIAGNOSIS — D709 Neutropenia, unspecified: Secondary | ICD-10-CM | POA: Diagnosis not present

## 2020-03-13 DIAGNOSIS — G620 Drug-induced polyneuropathy: Secondary | ICD-10-CM

## 2020-03-13 DIAGNOSIS — N289 Disorder of kidney and ureter, unspecified: Secondary | ICD-10-CM

## 2020-03-13 DIAGNOSIS — R5081 Fever presenting with conditions classified elsewhere: Secondary | ICD-10-CM | POA: Diagnosis not present

## 2020-03-13 DIAGNOSIS — M87180 Osteonecrosis due to drugs, jaw: Secondary | ICD-10-CM | POA: Diagnosis not present

## 2020-03-13 LAB — CBC WITH DIFFERENTIAL/PLATELET
Abs Immature Granulocytes: 0.01 10*3/uL (ref 0.00–0.07)
Basophils Absolute: 0.1 10*3/uL (ref 0.0–0.1)
Basophils Relative: 1 %
Eosinophils Absolute: 0.1 10*3/uL (ref 0.0–0.5)
Eosinophils Relative: 2 %
HCT: 31.9 % — ABNORMAL LOW (ref 36.0–46.0)
Hemoglobin: 10.7 g/dL — ABNORMAL LOW (ref 12.0–15.0)
Immature Granulocytes: 0 %
Lymphocytes Relative: 28 %
Lymphs Abs: 1.2 10*3/uL (ref 0.7–4.0)
MCH: 29 pg (ref 26.0–34.0)
MCHC: 33.5 g/dL (ref 30.0–36.0)
MCV: 86.4 fL (ref 80.0–100.0)
Monocytes Absolute: 0.3 10*3/uL (ref 0.1–1.0)
Monocytes Relative: 7 %
Neutro Abs: 2.7 10*3/uL (ref 1.7–7.7)
Neutrophils Relative %: 62 %
Platelets: 167 10*3/uL (ref 150–400)
RBC: 3.69 MIL/uL — ABNORMAL LOW (ref 3.87–5.11)
RDW: 14 % (ref 11.5–15.5)
WBC: 4.4 10*3/uL (ref 4.0–10.5)
nRBC: 0 % (ref 0.0–0.2)

## 2020-03-13 LAB — COMPREHENSIVE METABOLIC PANEL
ALT: 23 U/L (ref 0–44)
AST: 24 U/L (ref 15–41)
Albumin: 4.2 g/dL (ref 3.5–5.0)
Alkaline Phosphatase: 74 U/L (ref 38–126)
Anion gap: 10 (ref 5–15)
BUN: 23 mg/dL (ref 8–23)
CO2: 25 mmol/L (ref 22–32)
Calcium: 8.9 mg/dL (ref 8.9–10.3)
Chloride: 102 mmol/L (ref 98–111)
Creatinine, Ser: 1.06 mg/dL — ABNORMAL HIGH (ref 0.44–1.00)
GFR calc Af Amer: >60 mL/min (ref >60)
GFR calc non Af Amer: 56 mL/min — ABNORMAL LOW (ref >60)
Glucose, Bld: 119 mg/dL — ABNORMAL HIGH (ref 70–99)
Potassium: 3.9 mmol/L (ref 3.5–5.1)
Sodium: 137 mmol/L (ref 135–145)
Total Bilirubin: 0.1 mg/dL — ABNORMAL LOW (ref 0.3–1.2)
Total Protein: 6.8 g/dL (ref 6.5–8.1)

## 2020-03-13 LAB — MAGNESIUM: Magnesium: 1.1 mg/dL — ABNORMAL LOW (ref 1.7–2.4)

## 2020-03-13 MED ORDER — MONTELUKAST SODIUM 10 MG PO TABS
10.0000 mg | ORAL_TABLET | Freq: Once | ORAL | Status: DC
  Filled 2020-03-13: qty 1

## 2020-03-13 MED ORDER — DEXAMETHASONE 4 MG PO TABS
20.0000 mg | ORAL_TABLET | Freq: Once | ORAL | Status: AC
  Administered 2020-03-13: 20 mg via ORAL
  Filled 2020-03-13: qty 5

## 2020-03-13 MED ORDER — ACETAMINOPHEN 325 MG PO TABS
650.0000 mg | ORAL_TABLET | Freq: Once | ORAL | Status: AC
  Administered 2020-03-13: 650 mg via ORAL
  Filled 2020-03-13: qty 2

## 2020-03-13 MED ORDER — SODIUM CHLORIDE 0.9% FLUSH
10.0000 mL | INTRAVENOUS | Status: DC | PRN
  Administered 2020-03-13: 10 mL
  Filled 2020-03-13: qty 10

## 2020-03-13 MED ORDER — DARATUMUMAB-HYALURONIDASE-FIHJ 1800-30000 MG-UT/15ML ~~LOC~~ SOLN
1800.0000 mg | Freq: Once | SUBCUTANEOUS | Status: AC
  Administered 2020-03-13: 1800 mg via SUBCUTANEOUS
  Filled 2020-03-13: qty 15

## 2020-03-13 MED ORDER — SODIUM CHLORIDE 0.9 % IV SOLN
6.0000 g | Freq: Once | INTRAVENOUS | Status: AC
  Administered 2020-03-13: 6 g via INTRAVENOUS
  Filled 2020-03-13: qty 12

## 2020-03-13 MED ORDER — HEPARIN SOD (PORK) LOCK FLUSH 100 UNIT/ML IV SOLN
500.0000 [IU] | Freq: Once | INTRAVENOUS | Status: AC | PRN
  Administered 2020-03-13: 500 [IU]
  Filled 2020-03-13: qty 5

## 2020-03-13 MED ORDER — PROMETHAZINE HCL 25 MG/ML IJ SOLN
25.0000 mg | Freq: Once | INTRAMUSCULAR | Status: AC
  Administered 2020-03-13: 25 mg via INTRAVENOUS
  Filled 2020-03-13: qty 1

## 2020-03-13 MED ORDER — SODIUM CHLORIDE 0.9 % IV SOLN
Freq: Once | INTRAVENOUS | Status: AC
  Filled 2020-03-13: qty 250

## 2020-03-13 MED ORDER — DIPHENHYDRAMINE HCL 25 MG PO CAPS
50.0000 mg | ORAL_CAPSULE | Freq: Once | ORAL | Status: AC
  Administered 2020-03-13: 50 mg via ORAL
  Filled 2020-03-13: qty 2

## 2020-03-13 MED ORDER — CYANOCOBALAMIN 1000 MCG/ML IJ SOLN
1000.0000 ug | Freq: Once | INTRAMUSCULAR | Status: AC
  Administered 2020-03-13: 1000 ug via INTRAMUSCULAR
  Filled 2020-03-13: qty 1

## 2020-03-14 ENCOUNTER — Other Ambulatory Visit: Payer: Medicare PPO

## 2020-03-14 ENCOUNTER — Ambulatory Visit: Payer: Medicare PPO

## 2020-03-14 ENCOUNTER — Telehealth: Payer: Self-pay

## 2020-03-14 ENCOUNTER — Ambulatory Visit: Payer: Medicare PPO | Admitting: Hematology and Oncology

## 2020-03-14 MED ORDER — FLUCONAZOLE 100 MG PO TABS: 100.0000 mg | ORAL_TABLET | Freq: Every day | ORAL | 0 refills | Status: AC

## 2020-03-14 NOTE — Telephone Encounter (Signed)
Spoke with the patient to inform her that i was able to get in touch with [facility] in [place] and her rx is ready for pick up. The patient has been informed.

## 2020-03-15 ENCOUNTER — Ambulatory Visit: Payer: Medicare PPO | Attending: Hematology and Oncology

## 2020-03-15 ENCOUNTER — Telehealth: Payer: Self-pay | Admitting: *Deleted

## 2020-03-15 ENCOUNTER — Encounter: Payer: Self-pay | Admitting: Physical Therapy

## 2020-03-15 ENCOUNTER — Other Ambulatory Visit: Payer: Self-pay

## 2020-03-15 DIAGNOSIS — R202 Paresthesia of skin: Secondary | ICD-10-CM | POA: Insufficient documentation

## 2020-03-15 DIAGNOSIS — G894 Chronic pain syndrome: Secondary | ICD-10-CM

## 2020-03-15 DIAGNOSIS — M6281 Muscle weakness (generalized): Secondary | ICD-10-CM | POA: Diagnosis not present

## 2020-03-15 DIAGNOSIS — R200 Anesthesia of skin: Secondary | ICD-10-CM | POA: Insufficient documentation

## 2020-03-15 NOTE — Telephone Encounter (Signed)
  Please send orders to [facility] in [place] about resumption of orders s/p hospitalization.  M

## 2020-03-15 NOTE — Therapy (Signed)
[facility] [address] [place], [place], [address] Phone: [phone]   Fax:  [phone]  Physical Therapy Treatment  Patient Details  Name: Sarah [name] MRN: [id number] Date of Birth: 1955-10-03 Referring Provider (PT): Nolon Stalls, MD   Encounter Date: 03/15/2020   PT End of Session - 03/15/20 0732    Visit Number 4    Number of Visits 9    Date for PT Re-Evaluation 04/04/20    Authorization - Visit Number 4    Authorization - Number of Visits 10    PT Start Time 0730    PT Stop Time 0818    PT Time Calculation (min) 48 min    Activity Tolerance Patient tolerated treatment well    Behavior During Therapy [facility] for tasks assessed/performed           Past Medical History:  Diagnosis Date  . Abnormal stress test 02/14/2016   Overview:  Added automatically from request for surgery [id number]  . Anemia   . Anxiety   . Arthritis   . Bicuspid aortic valve   . CHF (congestive heart failure) ([place])   . CKD (chronic kidney disease) stage 3, GFR 30-59 ml/min   . Depression   . Diabetes mellitus ([place])   . Dizziness   . Fatty liver   . Frequent falls   . GERD (gastroesophageal reflux disease)   . Gout   . Heart murmur   . History of blood transfusion   . History of bone marrow transplant ([place])   . History of uterine fibroid   . Hx of cardiac catheterization 06/05/2016   Overview:  Normal coronaries 2017  . Hypertension   . Hypomagnesemia   . Multiple myeloma ([place])   . Personal history of chemotherapy   . Renal cyst     Past Surgical History:  Procedure Laterality Date  . ABDOMINAL HYSTERECTOMY    . 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 CARDIAC ELECTROPHYSIOLOGY MAPPING AND ABLATION    . CARPAL TUNNEL RELEASE Bilateral   . CHOLECYSTECTOMY  2008  . COLONOSCOPY WITH PROPOFOL N/A 05/07/2017   Procedure: COLONOSCOPY WITH PROPOFOL;  Surgeon: [name], MD;  Location: [facility] ENDOSCOPY;  Service: Gastroenterology;   Laterality: N/A;  . ESOPHAGOGASTRODUODENOSCOPY (EGD) WITH PROPOFOL N/A 05/07/2017   Procedure: ESOPHAGOGASTRODUODENOSCOPY (EGD) WITH PROPOFOL;  Surgeon: [name], MD;  Location: [facility] ENDOSCOPY;  Service: Gastroenterology;  Laterality: N/A;  . FOOT SURGERY Bilateral   . INCONTINENCE SURGERY  2009  . INTERSTIM IMPLANT PLACEMENT    . other     over active bladder  . OTHER SURGICAL HISTORY     bladder stimulator   . PARTIAL HYSTERECTOMY  03/1996   fibroids  . PORTA CATH INSERTION N/A 03/10/2019   Procedure: PORTA CATH INSERTION;  Surgeon: Algernon Huxley, MD;  Location: [facility];  Service: Cardiovascular;  Laterality: N/A;  . TONSILLECTOMY  2007    There were no vitals filed for this visit.   Subjective Assessment - 03/15/20 0733    Subjective Pt. states that she was admitted to hospital for low WBC on September 1st and was there for 4 days. Pt. reports she has seen her referring provider since the admission. Pt. states she has been feeling better each day since admission. Pt. has restarted her cancer tx and is going for tx tomorrow. Pt. reports no pain, but slightly unbalanced this morning, likely due to "moving too quickly.'    Pertinent History pt  has multiple myeloma, diagnosed in 2016. has bladder stimulator.    Patient Stated Goals Increase strength    Currently in Pain? No/denies    Pain Onset More than a month ago            There Ex:  Nustep L2 10 mins  Reassessment of goals:   -Walking goal: pt able to walk around clinic for 1:40 mins before onset of back pain 3/10.   -Strength testing: pt in sitting assessed multiple muscles. See updated goals for details. Pt required verbal and tactile cueing to complete motions correctly.  -5XSTS: pt improved time to 16.37s.   Supine Bridge: 10x. Pt required verbal and tactile cueing to maintain breathing and increase TrA activation.  Standing Marching: 10x each leg in front of // bars. Pt required finger support at bars  due to feeling unbalanced with no UE support. Pt required verbal cueing to maintain proper form.  Hooklying Transversus Abdominis Palpation: 5x contraction for 3s holds. Pt required verbal and tactile cueing to achieve proper contraction of TrA.   Sidelying Clamshell: 10x each side. Verbal cueing to decrease trunk rolling compensation.   Seated Bicep Curls Supinated with 3lb dumbbells: 10x each arm. Pt cued for slowed down motion and decreasing shoulder movements to complete activity.   Seated Triceps Extension with 2lb Dumbbell: 10x each arm. Verbal and tactile cueing to decrease shoulder and trunk motions to complete activity.          PT Education - 03/15/20 0845    Education Details see HEP    Person(s) Educated Patient    Methods Demonstration;Explanation;Handout    Comprehension Verbalized understanding;Returned demonstration               PT Long Term Goals - 03/15/20 0742      PT LONG TERM GOAL #1   Title Pt. will be able to walk for 10 minutes without pain increasing above 3/10 in low back to improve functional mobility.    Baseline initial: pt can only walk 3-5 mins before onset of pain 6-7/10.; 9/15: pt able to walk approx 1:40 before onset of back pain 3/10    Time 8    Period Weeks    Status Partially Met    Target Date 04/04/20      PT LONG TERM GOAL #2   Title Pt will improve 5XSTS to 15 seconds or less to improve overall strength and functional mobility.    Baseline initial: 25.57s;. 9/15: 16.34 sec    Time 8    Period Weeks    Status Partially Met    Target Date 04/04/20      PT LONG TERM GOAL #3   Title Pt will improve global strength to at least 4/5 to improve functional mobility.    Baseline R/L 4/4 Traps, 4-/4- Shoulder abduction, 3/4 Shoulder ER, 3/4 Shoulder IR, 4/4+ Biceps, 3+/4 Triceps, 3+/3+ Hip flexion, 4-/4- Hip abduction, 5/5 Hip adduction (seated), 5/5 Knee extension, 4-/4- Knee flexion, 4/4 Ankle dorsiflexion; 9/15: R/L. 5/5 traps, 3+/3+  shoulder abd, 3+/3+ shoulder flex 3+/3+ shoulder ER, 3+/3+ shoulder IR, 4/4 Biceps, 3+/3+ Triceps, 3+/3+ Hip flexion, 4/4 Hip abd, 4+/4+ Hip add, 4+/5 knee ext, 4/4 knee flex, 4/4+ ankle dorsiflexion    Time 8    Period Weeks    Status Partially Met    Target Date 04/04/20                 Plan - 03/15/20 0832    Clinical Impression  Statement Pt. returns to therapy after 4 day stay in hospital for neutropenia at the beginning of September. Pt. states she has seen referring provider (Dr. Mike Gip) since hospital admission, PT has called provider to ensure therapy clearance. PT reviewed goals, pt has improved on her 5XSTS time to 16.37 sec. Pt. still limited by back pain in walking to approx 1:40 mins before 3/10 back pain starts. Pt.'s strength is grossly unchanged, see updated strength goal for details. Pt. reviewed HEP today and added new exercises. Pt. will continue to benefit from skilled PT to improve global strength to improve pain free functional mobility and ability to complete ADLs.    Personal Factors and Comorbidities Comorbidity 2;Comorbidity 1    Comorbidities cancer    Stability/Clinical Decision Making Evolving/Moderate complexity    Clinical Decision Making Moderate    Rehab Potential Good    PT Frequency 1x / week    PT Duration 8 weeks    PT Treatment/Interventions ADLs/Self Care Home Management;Cryotherapy;Neuromuscular re-education;Balance training;Therapeutic exercise;Therapeutic activities;Functional mobility training;Gait training;Patient/family education;Manual techniques    PT Next Visit Plan core progression, LE nerve glides    PT Home Exercise Plan [email or web address]    Consulted and Agree with Plan of Care Patient           Patient will benefit from skilled therapeutic intervention in order to improve the following deficits and impairments:  Abnormal gait, Decreased activity tolerance, Decreased balance, Decreased mobility, Decreased strength, Impaired sensation,  Postural dysfunction, Difficulty walking  Visit Diagnosis: Muscle weakness (generalized)  Chronic pain syndrome  Numbness and tingling     Problem List Patient Active Problem List   Diagnosis Date Noted  . Hypokalemia 03/11/2020  . Malnutrition of moderate degree 03/03/2020  . Acute lower UTI 03/03/2020  . Antineoplastic chemotherapy induced pancytopenia ([place]) 03/03/2020  . Dyspnea 03/02/2020  . Physical deconditioning 02/07/2020  . Impaired nutrition 02/07/2020  . 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 Acute renal insufficiency 01/24/2020  . Sepsis ([place]) 11/13/2019  . Pneumonia of right lung due to infectious organism 11/13/2019  . Constipation 10/24/2019  . AKI (acute kidney injury) ([place]) 10/15/2019  . Neutropenic fever ([place]) 10/15/2019  . Febrile neutropenia ([place]) 10/15/2019  . Encounter for antineoplastic immunotherapy 10/03/2019  . Hypocalcemia 08/28/2019  . Stage 3 chronic kidney disease 05/12/2019  . Chronic nausea 12/17/2018  . Chemotherapy-induced neuropathy ([place]) 12/17/2018  . Hyperlipidemia associated with type 2 diabetes mellitus ([place]) 11/25/2018  . Normocytic anemia 10/21/2018  . Hypomagnesemia 08/20/2018  . Goals of care, counseling/discussion 08/20/2018  . B12 deficiency 08/20/2018  . Thrombocytopenia ([place]) 02/09/2018  . Benign essential HTN 08/21/2017  . Cardiomyopathy, idiopathic ([place]) 08/21/2017  . Carotid artery stenosis, asymptomatic, bilateral 08/06/2017  . Thyroid nodule 08/06/2017  . Cardiac syncope 07/25/2017  . Iron deficiency anemia due to chronic blood loss 05/13/2017  . Spinal stenosis of lumbar region with neurogenic claudication 04/09/2017  . 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 LBBB (left bundle branch block) 10/10/2016  . Long term prescription opiate use 09/07/2016  . Chronic pain syndrome 07/08/2016  . Pain medication agreement signed 07/08/2016  . Spondylosis of lumbar region without  myelopathy or radiculopathy 07/08/2016  . Chronic diastolic CHF (congestive heart failure), NYHA class 2 ([place]) 06/05/2016  . LVH (left ventricular hypertrophy) due to hypertensive disease, with heart failure ([place]) 06/05/2016  . Mild aortic valve stenosis 06/05/2016  . 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[place]) 06/05/2016  . Environmental and seasonal allergies 04/19/2016  . Insomnia 04/19/2016  . Bicuspid aortic  valve 04/19/2016  . Asthma 04/19/2016  . Aortic regurgitation 04/19/2016  . Type II diabetes mellitus with complication ([place]) 28/76/8115  . Depression, major, single episode, in partial remission ([place]) 04/12/2016  . Irritable bowel syndrome (IBS) 04/12/2016  . Hepatic steatosis 04/12/2016  . Multiple myeloma not having achieved remission ([place]) 04/02/2016  . 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[place]) 07/06/2015  . Stem cell transplant candidate 05/16/2015    Carlyle Basques, SPT 03/15/2020, 9:21 AM  [facility] [address]. [place], [place], [address] Phone: [phone]   Fax:  [phone]  Name: Jaella Weinert MRN: [id number] Date of Birth: [birth year]

## 2020-03-15 NOTE — Patient Instructions (Signed)
Access Code: [email or web address] URL: [email or web address] Date: 03/15/2020 Prepared by: Toney Reil  Exercises Supine Bridge - 1 x daily - 7 x weekly - 2 sets - 10 reps Sit to Stand without Arm Support - 1 x daily - 7 x weekly - 2 sets - 10 reps Standing Marching - 1 x daily - 7 x weekly - 2 sets - 10 reps Hooklying Transversus Abdominis Palpation - 1 x daily - 7 x weekly - 2 sets - 10 reps Clamshell - 1 x daily - 7 x weekly - 2 sets - 10 reps Seated Bicep Curls Supinated with Dumbbells - 1 x daily - 7 x weekly - 2 sets - 10 reps Seated Triceps Extension with Dumbbell Single Arm - 1 x daily - 7 x weekly - 2 sets - 10 reps

## 2020-03-15 NOTE — Telephone Encounter (Signed)
[name] in [place] called asking for resumption of orders post hospitalization.

## 2020-03-16 ENCOUNTER — Encounter: Payer: Self-pay | Admitting: Emergency Medicine

## 2020-03-16 ENCOUNTER — Inpatient Hospital Stay: Payer: Medicare PPO

## 2020-03-16 ENCOUNTER — Other Ambulatory Visit: Payer: Self-pay

## 2020-03-16 ENCOUNTER — Inpatient Hospital Stay (HOSPITAL_BASED_OUTPATIENT_CLINIC_OR_DEPARTMENT_OTHER): Payer: Medicare PPO | Admitting: Hematology and Oncology

## 2020-03-16 ENCOUNTER — Encounter: Payer: Self-pay | Admitting: Hematology and Oncology

## 2020-03-16 ENCOUNTER — Telehealth: Payer: Self-pay

## 2020-03-16 ENCOUNTER — Emergency Department: Payer: Medicare PPO

## 2020-03-16 VITALS — BP 102/60 | HR 88 | Temp 97.7°F | Resp 18

## 2020-03-16 VITALS — BP 95/67 | HR 92 | Resp 18

## 2020-03-16 DIAGNOSIS — I130 Hypertensive heart and chronic kidney disease with heart failure and stage 1 through stage 4 chronic kidney disease, or unspecified chronic kidney disease: Secondary | ICD-10-CM | POA: Insufficient documentation

## 2020-03-16 DIAGNOSIS — Z87891 Personal history of nicotine dependence: Secondary | ICD-10-CM | POA: Diagnosis not present

## 2020-03-16 DIAGNOSIS — N1832 Chronic kidney disease, stage 3b: Secondary | ICD-10-CM | POA: Diagnosis not present

## 2020-03-16 DIAGNOSIS — E1169 Type 2 diabetes mellitus with other specified complication: Secondary | ICD-10-CM | POA: Insufficient documentation

## 2020-03-16 DIAGNOSIS — Z9221 Personal history of antineoplastic chemotherapy: Secondary | ICD-10-CM | POA: Diagnosis not present

## 2020-03-16 DIAGNOSIS — E860 Dehydration: Secondary | ICD-10-CM | POA: Insufficient documentation

## 2020-03-16 DIAGNOSIS — Z7984 Long term (current) use of oral hypoglycemic drugs: Secondary | ICD-10-CM | POA: Insufficient documentation

## 2020-03-16 DIAGNOSIS — E782 Mixed hyperlipidemia: Secondary | ICD-10-CM | POA: Diagnosis not present

## 2020-03-16 DIAGNOSIS — J45909 Unspecified asthma, uncomplicated: Secondary | ICD-10-CM | POA: Diagnosis not present

## 2020-03-16 DIAGNOSIS — Z5112 Encounter for antineoplastic immunotherapy: Secondary | ICD-10-CM | POA: Diagnosis not present

## 2020-03-16 DIAGNOSIS — D801 Nonfamilial hypogammaglobulinemia: Secondary | ICD-10-CM | POA: Diagnosis not present

## 2020-03-16 DIAGNOSIS — I959 Hypotension, unspecified: Secondary | ICD-10-CM | POA: Diagnosis not present

## 2020-03-16 DIAGNOSIS — E1122 Type 2 diabetes mellitus with diabetic chronic kidney disease: Secondary | ICD-10-CM | POA: Diagnosis not present

## 2020-03-16 DIAGNOSIS — N289 Disorder of kidney and ureter, unspecified: Secondary | ICD-10-CM | POA: Diagnosis not present

## 2020-03-16 DIAGNOSIS — R7402 Elevation of levels of lactic acid dehydrogenase (LDH): Secondary | ICD-10-CM | POA: Diagnosis present

## 2020-03-16 DIAGNOSIS — R5081 Fever presenting with conditions classified elsewhere: Secondary | ICD-10-CM | POA: Diagnosis not present

## 2020-03-16 DIAGNOSIS — R7989 Other specified abnormal findings of blood chemistry: Secondary | ICD-10-CM

## 2020-03-16 DIAGNOSIS — R0602 Shortness of breath: Secondary | ICD-10-CM | POA: Diagnosis not present

## 2020-03-16 DIAGNOSIS — Z7982 Long term (current) use of aspirin: Secondary | ICD-10-CM | POA: Diagnosis not present

## 2020-03-16 DIAGNOSIS — C9000 Multiple myeloma not having achieved remission: Secondary | ICD-10-CM | POA: Diagnosis not present

## 2020-03-16 DIAGNOSIS — T50995A Adverse effect of other drugs, medicaments and biological substances, initial encounter: Secondary | ICD-10-CM | POA: Diagnosis not present

## 2020-03-16 DIAGNOSIS — I10 Essential (primary) hypertension: Secondary | ICD-10-CM | POA: Diagnosis not present

## 2020-03-16 DIAGNOSIS — M87180 Osteonecrosis due to drugs, jaw: Secondary | ICD-10-CM | POA: Diagnosis not present

## 2020-03-16 DIAGNOSIS — Z1152 Encounter for screening for COVID-19: Secondary | ICD-10-CM | POA: Diagnosis not present

## 2020-03-16 DIAGNOSIS — D649 Anemia, unspecified: Secondary | ICD-10-CM | POA: Diagnosis not present

## 2020-03-16 DIAGNOSIS — I5032 Chronic diastolic (congestive) heart failure: Secondary | ICD-10-CM | POA: Diagnosis not present

## 2020-03-16 DIAGNOSIS — J90 Pleural effusion, not elsewhere classified: Secondary | ICD-10-CM | POA: Diagnosis not present

## 2020-03-16 DIAGNOSIS — R809 Proteinuria, unspecified: Secondary | ICD-10-CM | POA: Diagnosis not present

## 2020-03-16 DIAGNOSIS — D709 Neutropenia, unspecified: Secondary | ICD-10-CM | POA: Diagnosis not present

## 2020-03-16 LAB — CBC WITH DIFFERENTIAL/PLATELET
Abs Immature Granulocytes: 0.03 10*3/uL (ref 0.00–0.07)
Abs Immature Granulocytes: 0.01 10*3/uL (ref 0.00–0.07)
Basophils Absolute: 0 10*3/uL (ref 0.0–0.1)
Basophils Absolute: 0 10*3/uL (ref 0.0–0.1)
Basophils Relative: 1 %
Basophils Relative: 1 %
Eosinophils Absolute: 0.1 10*3/uL (ref 0.0–0.5)
Eosinophils Absolute: 0.2 10*3/uL (ref 0.0–0.5)
Eosinophils Relative: 2 %
Eosinophils Relative: 2 %
HCT: 27.8 % — ABNORMAL LOW (ref 36.0–46.0)
HCT: 30.5 % — ABNORMAL LOW (ref 36.0–46.0)
Hemoglobin: 9.2 g/dL — ABNORMAL LOW (ref 12.0–15.0)
Hemoglobin: 10 g/dL — ABNORMAL LOW (ref 12.0–15.0)
Immature Granulocytes: 1 %
Immature Granulocytes: 0 %
Lymphocytes Relative: 17 %
Lymphocytes Relative: 18 %
Lymphs Abs: 1.1 10*3/uL (ref 0.7–4.0)
Lymphs Abs: 1.2 10*3/uL (ref 0.7–4.0)
MCH: 29.5 pg (ref 26.0–34.0)
MCH: 29.3 pg (ref 26.0–34.0)
MCHC: 33.1 g/dL (ref 30.0–36.0)
MCHC: 32.8 g/dL (ref 30.0–36.0)
MCV: 89.1 fL (ref 80.0–100.0)
MCV: 89.4 fL (ref 80.0–100.0)
Monocytes Absolute: 0.6 10*3/uL (ref 0.1–1.0)
Monocytes Absolute: 0.6 10*3/uL (ref 0.1–1.0)
Monocytes Relative: 9 %
Monocytes Relative: 9 %
Neutro Abs: 4.8 10*3/uL (ref 1.7–7.7)
Neutro Abs: 4.4 10*3/uL (ref 1.7–7.7)
Neutrophils Relative %: 70 %
Neutrophils Relative %: 70 %
Platelets: 136 10*3/uL — ABNORMAL LOW (ref 150–400)
Platelets: 157 10*3/uL (ref 150–400)
RBC: 3.12 MIL/uL — ABNORMAL LOW (ref 3.87–5.11)
RBC: 3.41 MIL/uL — ABNORMAL LOW (ref 3.87–5.11)
RDW: 14.5 % (ref 11.5–15.5)
RDW: 14.2 % (ref 11.5–15.5)
WBC: 6.7 10*3/uL (ref 4.0–10.5)
WBC: 6.3 10*3/uL (ref 4.0–10.5)
nRBC: 0 % (ref 0.0–0.2)
nRBC: 0 % (ref 0.0–0.2)

## 2020-03-16 LAB — COMPREHENSIVE METABOLIC PANEL
ALT: 23 U/L (ref 0–44)
ALT: 24 U/L (ref 0–44)
AST: 22 U/L (ref 15–41)
AST: 23 U/L (ref 15–41)
Albumin: 3.6 g/dL (ref 3.5–5.0)
Albumin: 4.2 g/dL (ref 3.5–5.0)
Alkaline Phosphatase: 53 U/L (ref 38–126)
Alkaline Phosphatase: 66 U/L (ref 38–126)
Anion gap: 10 (ref 5–15)
Anion gap: 9 (ref 5–15)
BUN: 41 mg/dL — ABNORMAL HIGH (ref 8–23)
BUN: 38 mg/dL — ABNORMAL HIGH (ref 8–23)
CO2: 22 mmol/L (ref 22–32)
CO2: 24 mmol/L (ref 22–32)
Calcium: 8.8 mg/dL — ABNORMAL LOW (ref 8.9–10.3)
Calcium: 9 mg/dL (ref 8.9–10.3)
Chloride: 104 mmol/L (ref 98–111)
Chloride: 102 mmol/L (ref 98–111)
Creatinine, Ser: 1.36 mg/dL — ABNORMAL HIGH (ref 0.44–1.00)
Creatinine, Ser: 1.4 mg/dL — ABNORMAL HIGH (ref 0.44–1.00)
GFR calc Af Amer: 48 mL/min — ABNORMAL LOW (ref >60)
GFR calc Af Amer: 46 mL/min — ABNORMAL LOW (ref >60)
GFR calc non Af Amer: 41 mL/min — ABNORMAL LOW (ref >60)
GFR calc non Af Amer: 40 mL/min — ABNORMAL LOW (ref >60)
Glucose, Bld: 122 mg/dL — ABNORMAL HIGH (ref 70–99)
Glucose, Bld: 125 mg/dL — ABNORMAL HIGH (ref 70–99)
Potassium: 4.3 mmol/L (ref 3.5–5.1)
Potassium: 4.4 mmol/L (ref 3.5–5.1)
Sodium: 136 mmol/L (ref 135–145)
Sodium: 135 mmol/L (ref 135–145)
Total Bilirubin: 0.4 mg/dL (ref 0.3–1.2)
Total Bilirubin: 0.4 mg/dL (ref 0.3–1.2)
Total Protein: 5.6 g/dL — ABNORMAL LOW (ref 6.5–8.1)
Total Protein: 6.7 g/dL (ref 6.5–8.1)

## 2020-03-16 LAB — URINALYSIS, COMPLETE (UACMP) WITH MICROSCOPIC
Bacteria, UA: NONE SEEN
Bilirubin Urine: NEGATIVE
Bilirubin Urine: NEGATIVE
Glucose, UA: NEGATIVE mg/dL
Glucose, UA: NEGATIVE mg/dL
Hgb urine dipstick: NEGATIVE
Hgb urine dipstick: NEGATIVE
Ketones, ur: NEGATIVE mg/dL
Ketones, ur: NEGATIVE mg/dL
Leukocytes,Ua: NEGATIVE
Nitrite: NEGATIVE
Nitrite: NEGATIVE
Protein, ur: NEGATIVE mg/dL
Protein, ur: NEGATIVE mg/dL
RBC / HPF: NONE SEEN RBC/hpf (ref 0–5)
Specific Gravity, Urine: 1.02 (ref 1.005–1.030)
Specific Gravity, Urine: 1.01 (ref 1.005–1.030)
pH: 5 (ref 5.0–8.0)
pH: 5 (ref 5.0–8.0)

## 2020-03-16 LAB — LACTIC ACID, PLASMA
Lactic Acid, Venous: 2.5 mmol/L (ref 0.5–1.9)
Lactic Acid, Venous: 1.6 mmol/L (ref 0.5–1.9)

## 2020-03-16 LAB — MAGNESIUM: Magnesium: 1.2 mg/dL — ABNORMAL LOW (ref 1.7–2.4)

## 2020-03-16 MED ORDER — MAGNESIUM SULFATE 2 GM/50ML IV SOLN
INTRAVENOUS | Status: AC
  Filled 2020-03-16: qty 50

## 2020-03-16 MED ORDER — MAGNESIUM SULFATE 4 GM/100ML IV SOLN
4.0000 g | Freq: Once | INTRAVENOUS | Status: AC
  Administered 2020-03-16: 4 g via INTRAVENOUS

## 2020-03-16 MED ORDER — DEXTROSE 5 % IV SOLN
INTRAVENOUS | Status: DC
  Filled 2020-03-16: qty 250

## 2020-03-16 MED ORDER — IMMUNE GLOBULIN (HUMAN) 10 GM/100ML IV SOLN
312.0000 mg/kg | Freq: Once | INTRAVENOUS | Status: DC
  Filled 2020-03-16: qty 200

## 2020-03-16 MED ORDER — SODIUM CHLORIDE 0.9 % IV SOLN
Freq: Once | INTRAVENOUS | Status: DC | PRN
  Filled 2020-03-16: qty 250

## 2020-03-16 MED ORDER — SODIUM CHLORIDE 0.9 % IV SOLN
6.0000 g | Freq: Once | INTRAVENOUS | Status: DC
  Filled 2020-03-16: qty 12

## 2020-03-16 MED ORDER — PROMETHAZINE HCL 25 MG/ML IJ SOLN
25.0000 mg | Freq: Once | INTRAMUSCULAR | Status: AC
  Administered 2020-03-16: 25 mg via INTRAVENOUS
  Filled 2020-03-16: qty 1

## 2020-03-16 MED ORDER — HEPARIN SOD (PORK) LOCK FLUSH 100 UNIT/ML IV SOLN
500.0000 [IU] | Freq: Once | INTRAVENOUS | Status: DC | PRN
  Filled 2020-03-16: qty 5

## 2020-03-16 MED ORDER — ACETAMINOPHEN 325 MG PO TABS: 650.0000 mg | ORAL_TABLET | ORAL | Status: DC

## 2020-03-16 MED ORDER — MAGNESIUM SULFATE 2 GM/50ML IV SOLN
2.0000 g | Freq: Once | INTRAVENOUS | Status: AC
  Administered 2020-03-16: 2 g via INTRAVENOUS

## 2020-03-16 MED ORDER — LACTATED RINGERS IV BOLUS
1000.0000 mL | Freq: Once | INTRAVENOUS | Status: AC
  Administered 2020-03-17: 1000 mL via INTRAVENOUS

## 2020-03-16 MED ORDER — SODIUM CHLORIDE 0.9% FLUSH
10.0000 mL | Freq: Once | INTRAVENOUS | Status: AC | PRN
  Administered 2020-03-16: 10 mL
  Filled 2020-03-16: qty 10

## 2020-03-16 MED ORDER — SODIUM CHLORIDE 0.9 % IV SOLN
Freq: Once | INTRAVENOUS | Status: AC
  Filled 2020-03-16: qty 250

## 2020-03-16 MED ORDER — MAGNESIUM SULFATE 4 GM/100ML IV SOLN
INTRAVENOUS | Status: AC
  Filled 2020-03-16: qty 100

## 2020-03-16 MED ORDER — DIPHENHYDRAMINE HCL 25 MG PO TABS
25.0000 mg | ORAL_TABLET | ORAL | Status: DC
  Filled 2020-03-16: qty 1

## 2020-03-16 NOTE — ED Triage Notes (Signed)
Pt via pov from cancer center; has multiple myeloma. Pt has lactic acid of 2.5 today and was sent for eval and treatment - her bp was lower at cancer center, is 107/53 in triage. Pt alert & oriented, NAD noted

## 2020-03-16 NOTE — Telephone Encounter (Signed)
Faxed orders to [facility] ( PT) per Dr Mike Gip the patient can resumption of orders s/o hsopitalization. The patient was informed, Faxed conformation received

## 2020-03-16 NOTE — Progress Notes (Signed)
[facility]  [address] [place], [place] [address] Phone: [phone]  Fax: [phone]   Clinic Day:  03/16/2020   Referring physician: Glean Hess, MD  Chief Complaint: Sarah [name] is a 64 y.o. 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   HPI: The patient was last seen in the medical oncology clinic on 03/13/2020. At that time, she was feeling better since discharge from the hospital. Cough had improved.  She had chronic nausea, and mild abdominal soreness.  She has a vaginal yeast infection.  She was prescribed fluconazole.  She received day 1 of cycle #6 daratumumab. She began Pomalyst.  Patient was scheduled to be seen today in the infusion center for monthly IVIG.  The patient denied any complaint.  She stated that she had taken lisinopril last night (new).  Blood pressure was 67/43.  She denied any fever, chest pain, shortness of breath, cough, dizziness or light headedness.  Magnesium was 1.2.  She received 1 liter NS over 2 hours.  She received 6 gm magnesium.  Repeat blood pressure was 85/47.  Lactic acid was drawn and was 2.5 (elevated).  IVIG was not given.  On interview, patient notes participation in physical therapy yesterday.  She notes feeling achy today.  She denies any fever, chills or sweats.  She denies any urinary symptoms.   Past Medical History:  Diagnosis Date  . Abnormal stress test 02/14/2016   Overview:  Added automatically from request for surgery [id number]  . Anemia   . Anxiety   . Arthritis   . Bicuspid aortic valve   . CHF (congestive heart failure) ([place])   . CKD (chronic kidney disease) stage 3, GFR 30-59 ml/min   . Depression   . Diabetes mellitus ([place])   . Dizziness   . Fatty liver   . Frequent falls   . GERD (gastroesophageal reflux disease)   . Gout   . Heart  murmur   . History of blood transfusion   . History of bone marrow transplant ([place])   . History of uterine fibroid   . Hx of cardiac catheterization 06/05/2016   Overview:  Normal coronaries 2017  . Hypertension   . Hypomagnesemia   . Multiple myeloma ([place])   . Personal history of chemotherapy   . Renal cyst     Past Surgical History:  Procedure Laterality Date  . ABDOMINAL HYSTERECTOMY    . 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 CARDIAC ELECTROPHYSIOLOGY MAPPING AND ABLATION    . CARPAL TUNNEL RELEASE Bilateral   . CHOLECYSTECTOMY  2008  . COLONOSCOPY WITH PROPOFOL N/A 05/07/2017   Procedure: COLONOSCOPY WITH PROPOFOL;  Surgeon: [name], MD;  Location: [facility] ENDOSCOPY;  Service: Gastroenterology;  Laterality: N/A;  . ESOPHAGOGASTRODUODENOSCOPY (EGD) WITH PROPOFOL N/A 05/07/2017   Procedure: ESOPHAGOGASTRODUODENOSCOPY (EGD) WITH PROPOFOL;  Surgeon: [name], MD;  Location: [facility] ENDOSCOPY;  Service: Gastroenterology;  Laterality: N/A;  . FOOT SURGERY Bilateral   . INCONTINENCE SURGERY  2009  . INTERSTIM IMPLANT PLACEMENT    . other     over active bladder  . OTHER SURGICAL HISTORY     bladder stimulator   . PARTIAL HYSTERECTOMY  03/1996   fibroids  . PORTA CATH INSERTION N/A 03/10/2019   Procedure: PORTA CATH INSERTION;  Surgeon: Algernon Huxley, MD;  Location: [facility] INVASIVE CV  LAB;  Service: Cardiovascular;  Laterality: N/A;  . TONSILLECTOMY  2007    Family History  Problem Relation Age of Onset  . Colon cancer Father   . Renal Disease Father   . Diabetes Mellitus II Father   . Melanoma Paternal Grandmother   . Breast cancer Maternal Aunt 33  . Anemia Mother   . Heart disease Mother   . Heart failure Mother   . Renal Disease Mother   . Congestive Heart Failure Mother   . Heart disease Maternal Uncle   . Throat cancer Maternal Uncle   . Lung cancer Maternal Uncle   . Liver disease Maternal Uncle   . Heart failure Maternal Uncle   . Hearing loss Son 60       Suicide      Social History:  reports that she quit smoking about 28 years ago. Her smoking use included cigarettes. She has a 20.00 pack-year smoking history. She has never used smokeless tobacco. She reports current alcohol use. She reports that she does not use drugs. Patient has not had ETOH in several months. She is on disability. She notes exposure to perchloroethylene [facility]).She lives much of her adult life in [place]. She was married with 2 sons. Her husband passed away. Her 2 sons took their own lives (age 11 and 68). She worked in [employer] in [employer]. She went back to school and earned an [facility] and works for [employer] for several years.She lives[place] sistersin [place]. The patient is alone today.   Allergies:  Allergies  Allergen Reactions  . Oxycodone-Acetaminophen Anaphylaxis    Swelling and rash  . Celebrex [Celecoxib] Diarrhea  . Codeine   . Plerixafor     In 2016 during ASCT collection patient developed fever to 103.84F and required hospitalization  . Benadryl [Diphenhydramine] Palpitations  . Morphine Itching and Rash  . Ondansetron Diarrhea  . Tylenol [Acetaminophen] Itching and Rash    Current Medications: Current Outpatient Medications  Medication Sig Dispense Refill  . acetaminophen (TYLENOL) 500 MG tablet Take 500 mg by mouth every 6 (six) hours as needed.     Marland Kitchen acyclovir (ZOVIRAX) 400 MG tablet Take 1 tablet (400 mg total) by mouth 2 (two) times daily. 60 tablet 11  . allopurinol (ZYLOPRIM) 100 MG tablet TAKE 1 TABLET(100 MG) BY MOUTH DAILY as needed 90 tablet 1  . aspirin 81 MG chewable tablet Chew 1 tablet (81 mg total) by mouth daily. 30 tablet 0  . dexamethasone (DECADRON) 4 MG tablet Take 5 tablets (92m) on days 2, 16 of cycle 3,4, 5 and 6 Take 5 tablets (227m on days 2 of cycles 7 and beyond (Patient not taking: Reported on 03/07/2020) 50 tablet 0  . diclofenac sodium (VOLTAREN) 1 % GEL Apply 2 g topically 4 (four) times daily.  100 g 1  . diphenhydrAMINE (BENADRYL) 50 MG capsule Take 50 mg by mouth as needed.     . diphenoxylate-atropine (LOMOTIL) 2.5-0.025 MG tablet Take 2 tablets by mouth daily as needed for diarrhea or loose stools. (Patient not taking: Reported on 03/01/2020) 45 tablet 2  . fentaNYL (DURAGESIC - DOSED MCG/HR) 25 MCG/HR patch Place 25 mcg onto the skin every 3 (three) days.     . fexofenadine (ALLEGRA) 180 MG tablet TAKE 1 TABLET(180 MG) BY MOUTH DAILY (Patient not taking: Reported on 03/07/2020) 30 tablet 5  . fluconazole (DIFLUCAN) 100 MG tablet Take 1 tablet (100 mg total) by mouth daily for 3 days. 3  tablet 0  . FLUoxetine (PROZAC) 40 MG capsule TAKE 1 CAPSULE EVERY DAY 90 capsule 1  . fluticasone (FLONASE) 50 MCG/ACT nasal spray Place 2 sprays into both nostrils daily. 16 g 2  . glucose blood (ONE TOUCH ULTRA TEST) test strip     . lactulose (CHRONULAC) 10 GM/15ML solution Take 20 g by mouth daily as needed.     . lidocaine-prilocaine (EMLA) cream APPLY TOPICALLY AS NEEDED. 30 g 0  . lisinopril (ZESTRIL) 10 MG tablet Take 10 mg by mouth once.  (Patient not taking: Reported on 03/01/2020)    . metFORMIN (GLUCOPHAGE-XR) 500 MG 24 hr tablet TAKE 1 TABLET (500 MG TOTAL) BY MOUTH DAILY WITH BREAKFAST. 90 tablet 1  . montelukast (SINGULAIR) 10 MG tablet Take 1 tablet by mouth on the day before treatment and for 2 days after treatment. (Patient not taking: Reported on 03/07/2020) 30 tablet 0  . Multiple Vitamin (MULTIVITAMIN) tablet Take 1 tablet by mouth daily.    . Multiple Vitamins-Minerals (HAIR/SKIN/NAILS) TABS Take 1 tablet by mouth daily.    Marland Kitchen NARCAN 4 MG/0.1ML LIQD nasal spray kit 1 spray.  (Patient not taking: Reported on 03/01/2020)    . omeprazole (PRILOSEC) 40 MG capsule TAKE 1 CAPSULE EVERY DAY 90 capsule 3  . pomalidomide (POMALYST) 3 MG capsule Take 1 capsule (3 mg total) by mouth daily for 14 days. [employer] Auth # [id number]    Date Obtained 03/08/2020   Take 1 cap po daily for 14 days and 14 days off.  14 capsule 1  . promethazine (PHENERGAN) 25 MG tablet Take 1 tablet (25 mg total) by mouth every 6 (six) hours as needed for nausea or vomiting. 90 tablet 0  . promethazine-dextromethorphan (PROMETHAZINE-DM) 6.25-15 MG/5ML syrup Take 5 mLs by mouth 4 (four) times daily as needed for cough. (Patient not taking: Reported on 02/21/2020) 240 mL 0  . promethazine-dextromethorphan (PROMETHAZINE-DM) 6.25-15 MG/5ML syrup Take 5 mLs by mouth every 8 (eight) hours as needed for cough. 240 mL 0  . tiZANidine (ZANAFLEX) 4 MG tablet Take 1 tablet (4 mg total) by mouth every 8 (eight) hours as needed for muscle spasms. 270 tablet 1  . traZODone (DESYREL) 100 MG tablet TAKE 1 TABLET AT BEDTIME  FOR  SLEEP 90 tablet 0   No current facility-administered medications for this visit.   Facility-Administered Medications Ordered in Other Visits  Medication Dose Route Frequency Provider Last Rate Last Admin  . 0.9 %  sodium chloride infusion   Intravenous Continuous Lequita Asal, MD   Stopped at 01/20/20 1232  . 0.9 %  sodium chloride infusion   Intravenous Once PRN Lequita Asal, MD   Stopped at 03/16/20 1127  . acetaminophen (TYLENOL) tablet 650 mg  650 mg Oral UD [place] Gamero C, MD      . dextrose 5 % solution   Intravenous Continuous David Towson C, MD      . diphenhydrAMINE (BENADRYL) tablet 25 mg  25 mg Oral UD March Joos C, MD      . heparin lock flush 100 unit/mL  500 Units Intravenous Once Faythe Casa E, NP      . heparin lock flush 100 unit/mL  500 Units Intracatheter Once PRN Lequita Asal, MD      . Immune Globulin 10% (PRIVIGEN) IV infusion 20 g  312 mg/kg Intravenous Once [place] Digiacomo C, MD      . sodium chloride flush (NS) 0.9 % injection 10 mL  10 mL Intravenous  PRN Lequita Asal, MD   10 mL at 02/07/20 0815  . sodium chloride flush (NS) 0.9 % injection 10 mL  10 mL Intracatheter PRN Cammie Sickle, MD   10 mL at 02/10/20 0815  . sodium chloride  flush (NS) 0.9 % injection 10 mL  10 mL Intravenous PRN Jacquelin Hawking, NP   10 mL at 03/01/20 1143    Review of Systems  Constitutional: Negative.  Negative for chills, diaphoresis, fever, malaise/fatigue and weight loss.       Feels "achy".  HENT: Negative.  Negative for congestion, ear discharge, ear pain, hearing loss, nosebleeds, sinus pain, sore throat and tinnitus.   Eyes: Negative.  Negative for blurred vision, double vision and photophobia.  Respiratory: Negative for cough, hemoptysis, sputum production, shortness of breath and wheezing.   Cardiovascular: Negative.  Negative for chest pain, palpitations and leg swelling.  Gastrointestinal: Positive for constipation, heartburn (on Prilosec) and nausea (chronic). Negative for abdominal pain, blood in stool, diarrhea, melena and vomiting.  Genitourinary: Negative.  Negative for dysuria, frequency and urgency.       Vaginal yeast infection.  Musculoskeletal: Negative for falls, joint pain, myalgias and neck pain. Back pain: chronic.  Skin: Negative.  Negative for itching and rash.  Neurological: Positive for sensory change (chronic numbness in fingers and toes). Negative for dizziness, tingling, weakness and headaches.  Endo/Heme/Allergies: Negative for environmental allergies. Does not bruise/bleed easily.       Diabetes.  Psychiatric/Behavioral: Negative.  Negative for memory loss. The patient is not nervous/anxious and does not have insomnia.   All other systems reviewed and are negative.   Performance status (ECOG): 1  Vitals Blood pressure 102/60, pulse 88, temperature 97.7 F (36.5 C), temperature source Tympanic, resp. rate 18, SpO2 96 %.  Physical Exam Vitals and nursing note reviewed.  Constitutional:      General: She is not in acute distress.    Appearance: She is well-developed. She is not ill-appearing, toxic-appearing or diaphoretic.     Interventions: Face mask in place.     Comments: Patient sitting  comfortably in the infusion chair eating in no acute distress.  HENT:     Head: Normocephalic and atraumatic.     Comments: Short curly blonde hair.    Mouth/Throat:     Mouth: Mucous membranes are moist.     Pharynx: Oropharynx is clear.  Eyes:     General: No scleral icterus.    Extraocular Movements: Extraocular movements intact.     Conjunctiva/sclera: Conjunctivae normal.     Pupils: Pupils are equal, round, and reactive to light.     Comments: Glasses. Brown eyes.   Cardiovascular:     Rate and Rhythm: Normal rate and regular rhythm.     Heart sounds: Normal heart sounds. No murmur heard.   Pulmonary:     Effort: Pulmonary effort is normal. No respiratory distress.     Breath sounds: Normal breath sounds. No wheezing or rales.     Comments: Fine crackles in right base on deep inspiration. Chest:     Chest wall: No tenderness.  Abdominal:     General: Bowel sounds are normal. There is no distension.     Palpations: Abdomen is soft. There is no hepatomegaly, splenomegaly or mass.     Tenderness: There is no abdominal tenderness. There is no guarding or rebound.  Musculoskeletal:        General: No swelling or tenderness. Normal range of motion.  Cervical back: Normal range of motion and neck supple.  Lymphadenopathy:     Head:     Right side of head: No preauricular, posterior auricular or occipital adenopathy.     Left side of head: No preauricular, posterior auricular or occipital adenopathy.     Cervical: No cervical adenopathy.     Upper Body:     Right upper body: No supraclavicular adenopathy.     Left upper body: No supraclavicular adenopathy.  Skin:    General: Skin is warm and dry.     Coloration: Skin is not jaundiced or pale.     Findings: No erythema.  Neurological:     Mental Status: She is alert and oriented to person, place, and time. Mental status is at baseline.  Psychiatric:        Behavior: Behavior normal.        Thought Content: Thought  content normal.        Judgment: Judgment normal.    Infusion on 03/16/2020  Component Date Value Ref Range Status  . Lactic Acid, Venous 03/16/2020 2.5* 0.5 - 1.9 mmol/L Final   Comment: CRITICAL RESULT CALLED TO, READ BACK BY AND VERIFIED WITH DONNIE SAUNDER_0  ON 03/16/20 BY HKP Performed at [facility], [address], [place], [place] [address]   Infusion on 03/16/2020  Component Date Value Ref Range Status  . Magnesium 03/16/2020 1.2* 1.7 - 2.4 mg/dL Final   Performed at [facility], [address] [place], [place] [address]  . WBC 03/16/2020 6.7  4.0 - 10.5 K/uL Final  . RBC 03/16/2020 3.12* 3.87 - 5.11 MIL/uL Final  . Hemoglobin 03/16/2020 9.2* 12.0 - 15.0 g/dL Final  . HCT 03/16/2020 27.8* 36 - 46 % Final  . MCV 03/16/2020 89.1  80.0 - 100.0 fL Final  . MCH 03/16/2020 29.5  26.0 - 34.0 pg Final  . MCHC 03/16/2020 33.1  30.0 - 36.0 g/dL Final  . RDW 03/16/2020 14.5  11.5 - 15.5 % Final  . Platelets 03/16/2020 136* 150 - 400 K/uL Final  . nRBC 03/16/2020 0.0  0.0 - 0.2 % Final  . Neutrophils Relative % 03/16/2020 70  % Final  . Neutro Abs 03/16/2020 4.8  1.7 - 7.7 K/uL Final  . Lymphocytes Relative 03/16/2020 17  % Final  . Lymphs Abs 03/16/2020 1.1  0.7 - 4.0 K/uL Final  . Monocytes Relative 03/16/2020 9  % Final  . Monocytes Absolute 03/16/2020 0.6  0 - 1 K/uL Final  . Eosinophils Relative 03/16/2020 2  % Final  . Eosinophils Absolute 03/16/2020 0.1  0 - 0 K/uL Final  . Basophils Relative 03/16/2020 1  % Final  . Basophils Absolute 03/16/2020 0.0  0 - 0 K/uL Final  . Immature Granulocytes 03/16/2020 1  % Final  . Abs Immature Granulocytes 03/16/2020 0.03  0.00 - 0.07 K/uL Final   Performed at [facility], [address], [place], [place] [address]  . Sodium 03/16/2020 136  135 - 145 mmol/L Final  . Potassium 03/16/2020 4.3  3.5 - 5.1 mmol/L Final  . Chloride 03/16/2020 104  98 - 111 mmol/L Final  . CO2 03/16/2020 22  22 - 32  mmol/L Final  . Glucose, Bld 03/16/2020 122* 70 - 99 mg/dL Final   Glucose reference range applies only to samples taken after fasting for at least 8 hours.  . BUN 03/16/2020 41* 8 - 23 mg/dL Final  . Creatinine, Ser 03/16/2020 1.36* 0.44 -  1.00 mg/dL Final  . Calcium 03/16/2020 8.8* 8.9 - 10.3 mg/dL Final  . Total Protein 03/16/2020 5.6* 6.5 - 8.1 g/dL Final  . Albumin 03/16/2020 3.6  3.5 - 5.0 g/dL Final  . AST 03/16/2020 22  15 - 41 U/L Final  . ALT 03/16/2020 23  0 - 44 U/L Final  . Alkaline Phosphatase 03/16/2020 53  38 - 126 U/L Final  . Total Bilirubin 03/16/2020 0.4  0.3 - 1.2 mg/dL Final  . GFR calc non Af Amer 03/16/2020 41* >60 mL/min Final  . GFR calc Af Amer 03/16/2020 48* >60 mL/min Final  . Anion gap 03/16/2020 10  5 - 15 Final   Performed at [facility], [address], [place], [place] [address]    Assessment:  Sarah [name] is a 64 y.o. 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 Bone marrow revealed 80% plasma cells. Lambda free light chains were 1340. She had nephrotic range proteinuria. She initially underwent induction with RVD. Revlimid maintenance was discontinued on 01/21/2017 secondary to intolerance.   Bone marrowaspirate andbiopsyon 01/18/2021revealed anormocellularmarrow withbut increased lambda-restricted plasma cells (9% aspirate, 40% CD138 immunohistochemistry).Findingswereconsistent with recurrent plasma cell myeloma.Flow cytometry revealed no monoclonal B-cell or phenotypically aberrant T-cell population. Cytogeneticswere 37, XX (normal). 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  M-spikehas been followed: 0 on 04/02/2016 -06/23/2019; 0.2 on 09/02/2017, 0.1 on 11/25/2019, 0.1 on 01/10/2020, 0.2 on 02/07/2020, and 0.1 on 03/07/2020.  Lambda light chainshave been followed: 22.2 (ratio 0.56) on 07/03/2017,  30.8 (ratio 0.78) on 09/02/2017, 36.9 (ratio 0.40) on 10/21/2017, 37.4 (ratio 0.41) on 12/16/2017, 70.7(ratio 0.31) on 02/17/2018, 64.2 (ratio 0.27) on 04/07/2018, 78.9 (ratio 0.18) on 05/26/2018, 128.8 (ratio 0.17) on 08/06/2018, 181.5 (ratio 0.13) on 10/08/2018, 130.9 (ratio 0.13) on 10/20/2018, 160.7 (ratio 0.10)on 12/09/2018, 236.6 (ratio 0.07) on 02/01/2019, 363.6 (ratio 0.04) on 03/22/2019, 404.8 (ratio 0.04) on 04/05/2019, 420.7 (ratio 0.03) on 05/24/2019, 573.4 (ratio 0.03) on 06/23/2019, 451.05(ratio 0.02) on02/19/2021, 47.2 (ratio 0.13) on 10/25/2019, 22.4 (ratio 0.21) on 11/25/2019, 16.5 (ratio 0.33) on 01/10/2020, 14.6 (ratio 0.34) on 02/07/2020, and 13.1 (ratio 0.31) on 03/07/2020.  24 hour UPEPon 06/03/2019 revealed kappa free light chains95.76,lambda free light chains1,260.71, andratio 0.08.24 hourUPEPon 02/22/2021revealed totalproteinof771m/24 hrs withlambdafree light chains 1,084.1106mL andratioof0.10(1.03-31.76). M spike inurinewas46.1%(36126m4 hrs).  Bone surveyon 04/08/2016 and11/28/2018 revealed no definite lytic lesion seen in the visualized skeleton.Bone surveyon 11/19/2018 revealed no suspicious lucent lesionsand no acute bony abnormality.PET scanon 07/12/2019 revealed no focal metabolic activity to suggest active myeloma within the skeleton. There wereno lytic lesions identified on the CT portion of the examorsoft tissue plasmacytomas. There was no evidence of multiple myeloma.  Pretreatment RBC phenotypeon 09/23/2019 waspositivefor C, e, DUFFY B, KIDD B, M, S, and s antigen; negativefor c, E, KELL, DUFFY A, KIDD A, and N antigen.  Sheis day 4 ofcycle #6 daratumumab and hyaluronidase-fihj, Pomalyst, and Decadron (DPd)(09/27/2019 - 10/25/2019; 12/09/2019 - 03/13/2020). Cycle #1 was complicated by fever and neutropenia requiring admission.  Cycle #2 was complicated by pneumonia requiring admission.  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 Zometa was discontinued in 01/2017. She has chronic nauseaon Phenergan.  She has B12 deficiency. B12 was 254 on 04/09/2017, 295 on 08/20/2018, and 391 on 10/08/2018. Shewasonoral B12.She received B12 monthly (last08/05/2020).Folate was18.6on 02/08/2019.  She has iron deficiency. Ferritin was 32 on 07/01/2019. She received Venoferon 07/15/2019 and 07/22/2019.  She has hypogammaglobulinemia.  IgG was 245 on 11/25/2019.  She received monthly IVIG (07/22/021 - 02/17/2020).  She received IVIG 400 mg/kg on 01/20/2020 and 200 mg/kg on 02/17/2020.  IVIG on 76/81/1572 was complicated by acute renal failure. IgG trough level was 418 on 02/17/2020.  She was admitted to [place] 10/15/2019 - 10/20/2019 with fever and neutropenia. Cultures were negative. CXR was negative. She received broad spectrum antibiotics and daily Granix. She received IVF for acute renal insufficiency due to diarrhea and dehydration. Creatine was 1.66 on admission and 1.12 on discharge.  She was admitted to [facility] from 03/01/2020 - 03/05/2020 with fever and neutropenia. CXR revealed no active cardiopulmonary disease. Chest CT with contrast revealed no acute intrathoracic pathology. There were findings which could be suggestive of prior granulomatous disease. She was treated with Cefepime and Vancomycin, then switched to ciprofloxacin on 03/03/2020.   She received her second COVID-19vaccineon 10/09/2019.  Symptomatically, she feels achy.  She denies fever.  Patient initially hypotensive (67/43).  Lactic acid is 2.5.  Plan: 1.   Labs today: CBC with diff, CMP, Mg, lactic acid. 2. Stage III multiple myeloma  She is day 4 of cycle #6 daratumumab, Pomalyst and Decadron.   Counts are good. 3.   Hypotension and elevated lactic acid  Concern for infection.  Patient has received 1.5 liter NS over 3 hours.   BP 67/43 then 85/47 then 102/60.  She has not  voided.  Hypotension complicated by patient taking an antihypertensive yesterday (lisinopril).  Discuss tarnsfer to [facility] ER for evaluation, blood cultures, and ongoing observation +/- antibiotics. 4. Renal insufficiency             Creatinine has increased from 1.06 to 1.36.             Etiology likely secondary to dehydration and poor renal perfusion.  Continue hydration. 5.   Hypogammaglobulinemia             Patient has had recurrent infections.               She was due for her 3rd cycle of IVIG today.  IVIG postponed secondary to blood pressure issues and known potential renal issues post IVIG.  Anticipate IVIG in clinic next week. 6.   Hypomagnesemia, chronic Magnesium1.2 today. She received magnesium 6 gm IV today.  Continue twice weekly magnesium checks with supplementation. 7.   Patient to [facility] ER.  Report called.   I discussed the assessment and treatment plan with the patient.  The patient was provided an opportunity to ask questions and all were answered.  The patient agreed with the plan and demonstrated an understanding of the instructions.  The patient was advised to call back if the symptoms worsen or if the condition fails to improve as anticipated.  Approximately 30 minutes were spent with the patient making multiple assessments, writing orders, and discussing the plan with the patient.    Lequita Asal, MD, PhD    03/16/2020, 1:37 PM

## 2020-03-16 NOTE — Progress Notes (Signed)
Patient came into the [facility] this am for planned lab work (magnesium) and IV magnesium and IVIG. Patient's BP was very low. MD notified. Lactic acid drawn per MD order. Fluids started at 999 and then titrated per MD order; fluids given over 2 hours. BP came up to 102/60. Patient's magnesium was low Patient received 6 grams of IV magnesium over 3 hours. Lactic acid came back high; MD notified. Per MD order, patient instructed to go to the ED for further workup of low BP and elevated lactic acid level. Patient verbalized understanding.

## 2020-03-17 ENCOUNTER — Telehealth: Payer: Self-pay | Admitting: Oncology

## 2020-03-17 ENCOUNTER — Emergency Department
Admission: EM | Admit: 2020-03-17 | Discharge: 2020-03-18 | Disposition: A | Discharge status: Home / Self Care | Payer: Medicare PPO | Attending: Emergency Medicine | Admitting: Emergency Medicine

## 2020-03-17 ENCOUNTER — Ambulatory Visit: Payer: Medicare PPO

## 2020-03-17 ENCOUNTER — Inpatient Hospital Stay: Payer: Medicare PPO

## 2020-03-17 ENCOUNTER — Emergency Department: Payer: Medicare PPO

## 2020-03-17 DIAGNOSIS — R0602 Shortness of breath: Secondary | ICD-10-CM | POA: Diagnosis not present

## 2020-03-17 DIAGNOSIS — E860 Dehydration: Secondary | ICD-10-CM

## 2020-03-17 LAB — LACTIC ACID, PLASMA: Lactic Acid, Venous: 1.6 mmol/L (ref 0.5–1.9)

## 2020-03-17 LAB — TROPONIN I (HIGH SENSITIVITY)
Troponin I (High Sensitivity): 4 ng/L (ref <18)
Troponin I (High Sensitivity): 4 ng/L (ref <18)

## 2020-03-17 LAB — URINE CULTURE: Culture: NO GROWTH

## 2020-03-17 MED ORDER — IOHEXOL 350 MG/ML SOLN
60.0000 mL | Freq: Once | INTRAVENOUS | Status: AC | PRN
  Administered 2020-03-17: 60 mL via INTRAVENOUS

## 2020-03-17 NOTE — ED Provider Notes (Signed)
[facility] Emergency Department Provider Note  ____________________________________________  Time seen: Approximately 3:00 AM  I have reviewed the triage vital signs and the nursing notes.   HISTORY  Chief Complaint Abnormal Lab   HPI Sarah [name] is a 64 y.o. female with a history of multiple myeloma s/p autologous transplant in 2016 currently on IV chemo (daratumumab, pomalyst, decadron) who presents for the cancer center for low BP and elevated lactic acid. Patient reports a recent mild illness last week where she had a cough and some constipation.  Has not been sick this week.  Denies cough, fever, chest pain, shortness of breath, abdominal pain, nausea, vomiting, diarrhea, dysuria, hematuria.  Went to the [facility] today for her infusion where she was found to be hypotensive with blood pressure of 63/40.  She received IV fluids, received her infusion.  The doctor was consulted and recommend a lactic acid which was elevated at 2.5 therefore patient was sent here for evaluation.  Patient reports that her blood pressure is usually in the low 100s.  Reports that she has been eating and drinking like she is supposed to.  Past Medical History:  Diagnosis Date  . Abnormal stress test 02/14/2016   Overview:  Added automatically from request for surgery [id number]  . Anemia   . Anxiety   . Arthritis   . Bicuspid aortic valve   . CHF (congestive heart failure) ([place])   . CKD (chronic kidney disease) stage 3, GFR 30-59 ml/min   . Depression   . Diabetes mellitus ([place])   . Dizziness   . Fatty liver   . Frequent falls   . GERD (gastroesophageal reflux disease)   . Gout   . Heart murmur   . History of blood transfusion   . History of bone marrow transplant ([place])   . History of uterine fibroid   . Hx of cardiac catheterization 06/05/2016   Overview:  Normal coronaries 2017  . Hypertension   . Hypomagnesemia   . Multiple myeloma ([place])   . Personal history  of chemotherapy   . Renal cyst     Patient Active Problem List   Diagnosis Date Noted  . Hypokalemia 03/11/2020  . Malnutrition of moderate degree 03/03/2020  . Acute lower UTI 03/03/2020  . Antineoplastic chemotherapy induced pancytopenia ([place]) 03/03/2020  . Dyspnea 03/02/2020  . Physical deconditioning 02/07/2020  . Impaired nutrition 02/07/2020  . 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 Acute renal insufficiency 01/24/2020  . Sepsis ([place]) 11/13/2019  . Pneumonia of right lung due to infectious organism 11/13/2019  . Constipation 10/24/2019  . AKI (acute kidney injury) ([place]) 10/15/2019  . Neutropenic fever ([place]) 10/15/2019  . Febrile neutropenia ([place]) 10/15/2019  . Encounter for antineoplastic immunotherapy 10/03/2019  . Hypocalcemia 08/28/2019  . Stage 3 chronic kidney disease 05/12/2019  . Chronic nausea 12/17/2018  . 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 Hyperlipidemia associated with type 2 diabetes mellitus ([place]) 11/25/2018  . Normocytic anemia 10/21/2018  . Hypomagnesemia 08/20/2018  . Goals of care, counseling/discussion 08/20/2018  . B12 deficiency 08/20/2018  . Thrombocytopenia ([place]) 02/09/2018  . Benign essential HTN 08/21/2017  . Cardiomyopathy, idiopathic ([place]) 08/21/2017  . Carotid artery stenosis, asymptomatic, bilateral 08/06/2017  . Thyroid nodule 08/06/2017  . Cardiac syncope 07/25/2017  . Iron deficiency anemia due to chronic blood loss 05/13/2017  . Spinal stenosis of lumbar region with neurogenic claudication 04/09/2017  . 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 LBBB (left bundle branch block) 10/10/2016  .  Long term prescription opiate use 09/07/2016  . Chronic pain syndrome 07/08/2016  . Pain medication agreement signed 07/08/2016  . Spondylosis of lumbar region without myelopathy or radiculopathy 07/08/2016  . Chronic diastolic CHF (congestive heart failure), NYHA class 2 ([place]) 06/05/2016  . LVH  (left ventricular hypertrophy) due to hypertensive disease, with heart failure ([place]) 06/05/2016  . Mild aortic valve stenosis 06/05/2016  . 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[place]) 06/05/2016  . Environmental and seasonal allergies 04/19/2016  . Insomnia 04/19/2016  . Bicuspid aortic valve 04/19/2016  . Asthma 04/19/2016  . Aortic regurgitation 04/19/2016  . Type II diabetes mellitus with complication ([place]) 47/03/6282  . Depression, major, single episode, in partial remission ([place]) 04/12/2016  . Irritable bowel syndrome (IBS) 04/12/2016  . Hepatic steatosis 04/12/2016  . Multiple myeloma not having achieved remission ([place]) 04/02/2016  . 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[place]) 07/06/2015  . Stem cell transplant candidate 05/16/2015    Past Surgical History:  Procedure Laterality Date  . ABDOMINAL HYSTERECTOMY    . 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 CARDIAC ELECTROPHYSIOLOGY MAPPING AND ABLATION    . CARPAL TUNNEL RELEASE Bilateral   . CHOLECYSTECTOMY  2008  . COLONOSCOPY WITH PROPOFOL N/A 05/07/2017   Procedure: COLONOSCOPY WITH PROPOFOL;  Surgeon: [name], MD;  Location[facility] ENDOSCOPY;  Service: Gastroenterology;  Laterality: N/A;  . ESOPHAGOGASTRODUODENOSCOPY (EGD) WITH PROPOFOL N/A 05/07/2017   Procedure: ESOPHAGOGASTRODUODENOSCOPY (EGD) WITH PROPOFOL;  Surgeon: [name], MD;  Location: [facility] ENDOSCOPY;  Service: Gastroenterology;  Laterality: N/A;  . FOOT SURGERY Bilateral   . INCONTINENCE SURGERY  2009  . INTERSTIM IMPLANT PLACEMENT    . other     over active bladder  . OTHER SURGICAL HISTORY     bladder stimulator   . PARTIAL HYSTERECTOMY  03/1996   fibroids  . PORTA CATH INSERTION N/A 03/10/2019   Procedure: PORTA CATH INSERTION;  Surgeon: Algernon Huxley, MD;  Location: [place] CV LAB;  Service: Cardiovascular;  Laterality: N/A;  . TONSILLECTOMY  2007    Prior to Admission medications   Medication Sig Start Date End Date Taking? Authorizing Provider    acetaminophen (TYLENOL) 500 MG tablet Take 500 mg by mouth every 6 (six) hours as needed.     [provider]  acyclovir (ZOVIRAX) 400 MG tablet Take 1 tablet (400 mg total) by mouth 2 (two) times daily. 12/24/19   Lequita Asal, MD  allopurinol (ZYLOPRIM) 100 MG tablet TAKE 1 TABLET(100 MG) BY MOUTH DAILY as needed 12/02/18   Glean Hess, MD  aspirin 81 MG chewable tablet Chew 1 tablet (81 mg total) by mouth daily. 07/27/17   Gladstone Lighter, MD  dexamethasone (DECADRON) 4 MG tablet Take 5 tablets (75m) on days 2, 16 of cycle 3,4, 5 and 6 Take 5 tablets (247m on days 2 of cycles 7 and beyond Patient not taking: Reported on 03/07/2020 02/18/20   CoLequita AsalMD  diclofenac sodium (VOLTAREN) 1 % GEL Apply 2 g topically 4 (four) times daily. 01/25/19   BeGertie BaronNP  diphenhydrAMINE (BENADRYL) 50 MG capsule Take 50 mg by mouth as needed.     [provider]  diphenoxylate-atropine (LOMOTIL) 2.5-0.025 MG tablet Take 2 tablets by mouth daily as needed for diarrhea or loose stools. Patient not taking: Reported on 03/01/2020 09/23/19   CoLequita AsalMD  fentaNYL (DURAGESIC - DOSED MCG/HR) 25 MCG/HR patch Place 25 mcg onto the skin every 3 (three) days.  08/12/16  [provider]  fexofenadine (ALLEGRA) 180 MG tablet TAKE 1 TABLET(180 MG) BY MOUTH DAILY Patient not taking: Reported on 03/07/2020 05/30/17   Glean Hess, MD  fluconazole (DIFLUCAN) 100 MG tablet Take 1 tablet (100 mg total) by mouth daily for 3 days. 03/14/20 03/17/20  Lequita Asal, MD  FLUoxetine (PROZAC) 40 MG capsule TAKE 1 CAPSULE EVERY DAY 09/30/19   Glean Hess, MD  fluticasone [facility]) 50 MCG/ACT nasal spray Place 2 sprays into both nostrils daily. 07/10/18   Glean Hess, MD  glucose blood (ONE TOUCH ULTRA TEST) test strip  09/04/15   [provider]  lactulose (CHRONULAC) 10 GM/15ML solution Take 20 g by mouth daily as needed.  10/09/19   [provider]  lidocaine-prilocaine (EMLA) cream APPLY TOPICALLY AS NEEDED. 04/07/19   Lequita Asal, MD  lisinopril (ZESTRIL) 10 MG tablet Take 10 mg by mouth once.  Patient not taking: Reported on 03/01/2020 12/10/19   [provider]  metFORMIN (GLUCOPHAGE-XR) 500 MG 24 hr tablet TAKE 1 TABLET (500 MG TOTAL) BY MOUTH DAILY WITH BREAKFAST. 01/28/20   Glean Hess, MD  montelukast (SINGULAIR) 10 MG tablet Take 1 tablet by mouth on the day before treatment and for 2 days after treatment. Patient not taking: Reported on 03/07/2020 12/16/19   Lequita Asal, MD  Multiple Vitamin (MULTIVITAMIN) tablet Take 1 tablet by mouth daily.    [provider]  Multiple Vitamins-Minerals (HAIR/SKIN/NAILS) TABS Take 1 tablet by mouth daily.    [provider]  NARCAN 4 MG/0.1ML LIQD nasal spray kit 1 spray.  Patient not taking: Reported on 03/01/2020 04/21/19   [provider]  omeprazole (PRILOSEC) 40 MG capsule TAKE 1 CAPSULE EVERY DAY 11/21/19   Glean Hess, MD  pomalidomide (POMALYST) 3 MG capsule Take 1 capsule (3 mg total) by mouth daily for 14 days. [employer] Auth # [id number]    Date Obtained 03/08/2020   Take 1 cap po daily for 14 days and 14 days off. 03/08/20 03/22/20  Lequita Asal, MD  promethazine (PHENERGAN) 25 MG tablet Take 1 tablet (25 mg total) by mouth every 6 (six) hours as needed for nausea or vomiting. 02/09/20   Lequita Asal, MD  promethazine-dextromethorphan (PROMETHAZINE-DM) 6.25-15 MG/5ML syrup Take 5 mLs by mouth 4 (four) times daily as needed for cough. Patient not taking: Reported on 02/21/2020 11/18/19   Lequita Asal, MD  promethazine-dextromethorphan (PROMETHAZINE-DM) 6.25-15 MG/5ML syrup Take 5 mLs by mouth every 8 (eight) hours as needed for cough. 03/05/20   Cammie Sickle, MD  tiZANidine (ZANAFLEX) 4 MG tablet Take 1 tablet (4 mg total) by mouth every 8 (eight) hours as needed for muscle spasms. 12/02/18   Glean Hess, MD  traZODone (DESYREL) 100 MG tablet TAKE 1 TABLET AT BEDTIME  FOR  SLEEP 01/28/20   Glean Hess, MD    Allergies Oxycodone-acetaminophen, Celebrex [celecoxib], Codeine, Plerixafor, Benadryl [diphenhydramine], Morphine, Ondansetron, and Tylenol [acetaminophen]  Family History  Problem Relation Age of Onset  . Colon cancer Father   . Renal Disease Father   . Diabetes Mellitus II Father   . Melanoma Paternal Grandmother   . Breast cancer Maternal Aunt 40  . Anemia Mother   . Heart disease Mother   . Heart failure Mother   . Renal Disease Mother   . Congestive Heart Failure Mother   . Heart disease Maternal Uncle   . Throat cancer Maternal Uncle   .  Lung cancer Maternal Uncle   . Liver disease Maternal Uncle   . Heart failure Maternal Uncle   . Hearing loss Son 42       Suicide     Social History Social History   Tobacco Use  . Smoking status: Former Smoker    Packs/day: 1.00    Years: 20.00    Pack years: 20.00    Types: Cigarettes    Quit date: 07/02/1991    Years since quitting: 28.7  . Smokeless tobacco: Never Used  Vaping Use  . Vaping Use: Never used  Substance Use Topics  . Alcohol use: Yes    Comment: occasionally  . Drug use: No    Review of Systems  Constitutional: Negative for fever. Eyes: Negative for visual changes. ENT: Negative for sore throat. Neck: No neck pain  Cardiovascular: Negative for chest pain. Respiratory: Negative for shortness of breath. Gastrointestinal: Negative for abdominal pain, vomiting or diarrhea. Genitourinary: Negative for dysuria. Musculoskeletal: Negative for back pain. Skin: Negative for rash. Neurological: Negative for headaches, weakness or numbness. Psych: No SI or HI  ____________________________________________   PHYSICAL EXAM:  VITAL SIGNS: ED Triage Vitals  Enc Vitals Group     BP 03/16/20 1516 96/82     Pulse Rate 03/16/20 1516 95     Resp 03/16/20 1516 18     Temp 03/16/20 1516 100 F  (37.8 C)     Temp Source 03/16/20 1516 Oral     SpO2 03/16/20 1516 93 %     Weight 03/16/20 1544 140 lb (63.5 kg)     Height 03/16/20 1544 5' 2.5" (1.588 m)     Head Circumference --      Peak Flow --      Pain Score 03/16/20 1544 6     Pain Loc --      Pain Edu? --      Excl. in [place]? --     Constitutional: Alert and oriented. Well appearing and in no apparent distress. HEENT:      Head: Normocephalic and atraumatic.         Eyes: Conjunctivae are normal. Sclera is non-icteric.       Mouth/Throat: Mucous membranes are moist.       Neck: Supple with no signs of meningismus. Cardiovascular: Regular rate and rhythm. No murmurs, gallops, or rubs.  Respiratory: Normal respiratory effort. Lungs are clear to auscultation bilaterally.  Gastrointestinal: Soft, non tender, and non distended. Musculoskeletal:  No edema, cyanosis, or erythema of extremities. Neurologic: Normal speech and language. Face is symmetric. Moving all extremities. No gross focal neurologic deficits are appreciated. Skin: Skin is warm, dry and intact. No rash noted. Psychiatric: Mood and affect are normal. Speech and behavior are normal.  ____________________________________________   LABS (all labs ordered are listed, but only abnormal results are displayed)  Labs Reviewed  COMPREHENSIVE METABOLIC PANEL - Abnormal; Notable for the following components:      Result Value   Glucose, Bld 125 (*)    BUN 38 (*)    Creatinine, Ser 1.40 (*)    GFR calc non Af Amer 40 (*)    GFR calc Af Amer 46 (*)    All other components within normal limits  CBC WITH DIFFERENTIAL/PLATELET - Abnormal; Notable for the following components:   RBC 3.41 (*)    Hemoglobin 10.0 (*)    HCT 30.5 (*)    All other components within normal limits  URINALYSIS, COMPLETE (UACMP) WITH MICROSCOPIC - Abnormal;  Notable for the following components:   Color, Urine STRAW (*)    APPearance CLEAR (*)    Leukocytes,Ua TRACE (*)    All other  components within normal limits  CULTURE, BLOOD (ROUTINE X 2)  CULTURE, BLOOD (ROUTINE X 2)  LACTIC ACID, PLASMA  TROPONIN I (HIGH SENSITIVITY)  TROPONIN I (HIGH SENSITIVITY)   ____________________________________________  EKG  ED ECG REPORT I, Rudene Re, the attending physician, personally viewed and interpreted this ECG.  Normal sinus rhythm, rate of 76, normal intervals, normal axis, no ST elevations or depressions.  Normal EKG. ____________________________________________  RADIOLOGY  I have personally reviewed the images performed during this visit and I agree with the Radiologist's read.   Interpretation by Radiologist:  DG Chest 2 View  Result Date: 03/16/2020 CLINICAL DATA:  64 year old female with multiple myeloma, hypotension and abnormal lactic acid today. Query infection. EXAM: CHEST - 2 VIEW COMPARISON:  Chest radiographs 03/07/2020 and earlier. FINDINGS: Stable right chest power port. Mildly lower lung volumes. Mediastinal contours remain normal. Visualized tracheal air column is within normal limits. The lungs appear stable and clear. No pneumothorax or pleural effusion. Stable visualized osseous structures. Stable cholecystectomy clips. Negative visible bowel gas pattern. IMPRESSION: Lower lung volumes.  No acute cardiopulmonary abnormality. Electronically Signed   By: Genevie Ann M.D.   On: 03/16/2020 16:44   CT Angio Chest PE W and/or Wo Contrast  Result Date: 03/17/2020 CLINICAL DATA:  Short of breath and chest pain since yesterday, multiple myeloma EXAM: CT ANGIOGRAPHY CHEST WITH CONTRAST TECHNIQUE: Multidetector CT imaging of the chest was performed using the standard protocol during bolus administration of intravenous contrast. Multiplanar CT image reconstructions and MIPs were obtained to evaluate the vascular anatomy. CONTRAST:  56m OMNIPAQUE IOHEXOL 350 MG/ML SOLN COMPARISON:  03/02/2020 FINDINGS: Cardiovascular: This is a technically adequate evaluation of  the pulmonary vasculature. No filling defects or pulmonary emboli. The heart is enlarged without pericardial effusion. Thoracic aorta is unremarkable. Mediastinum/Nodes: Calcified mediastinal lymph nodes consistent with previous granulomatous disease. No pathologic adenopathy. Thyroid, trachea, and esophagus are unremarkable. Lungs/Pleura: Hypoventilatory changes are seen at the lung bases. No acute airspace disease, effusion, or pneumothorax. The central airways are patent. Upper Abdomen: No acute abnormality. Musculoskeletal: No acute or destructive bony lesions. Reconstructed images demonstrate no additional findings. Review of the MIP images confirms the above findings. IMPRESSION: 1. No evidence of pulmonary embolus. No acute intrathoracic process. 2. Cardiomegaly. Electronically Signed   By: MRanda NgoM.D.   On: 03/17/2020 03:47    ____________________________________________   PROCEDURES  Procedure(s) performed: None Procedures Critical Care performed:  None ____________________________________________   INITIAL IMPRESSION / ASSESSMENT AND PLAN / ED COURSE  64y.o. female with a history of multiple myeloma s/p autologous transplant in 2016 currently on IV chemo (daratumumab, pomalyst, decadron) who presents for the cancer center for low BP and elevated lactic acid. Patient is asymptomatic, no fever, chills, N/V/D, dysuria, cough, abd pain.  Vitals here show a BP of 100/71, afebrile with normal pulse, normal respiratory rate, lungs are clear to auscultation, abdomen is soft and nontender.  Patient does have a port therefore blood cultures were sent.  Lactic acid here is normal at 1.6, patient has a normal white count with a normal ANC, no left shift.  CMP consistent with mild AKI.  No electrolyte derangements.  UA negative for UTI.  Covid negative.  Chest x-ray clean with no evidence of pneumonia, confirmed by radiology. Patient given 1 L NS. Possibly dehydration with  AKI vs infection  although all infectious markers seem negative. While receiving fluids patient started to complain of intermittent short lived sharp chest pain. No SOB or cough. EKG normal. Will cycle troponin. With history of cancer and hypotension earlier today will get CTA to rule out PE. Old medical records reviewed.   _________________________ 5:46 AM on 03/17/2020 -----------------------------------------  2 high-sensitivity troponins negative, CTA negative for pneumonia or PE.  Patient remains well-appearing and completely asymptomatic with normal vital signs, no fever, normal BP.  I do believe her presentation is most likely due to dehydration.  She is tolerating p.o.  Blood cultures have been sent to but no antibiotics given since there are no obvious signs of infection at this time.  I discussed with Dr. Grayland Ormond who is on-call for oncology and he agrees with management, does not recommend any antibiotics, and will make sure that the clinic follows up with the patient later today to reassess her.  Recommended she return to the emergency room if she has chest pain, shortness of breath, dizziness, nausea, vomiting, abdominal pain, or fever.  Otherwise she will follow-up with her oncologist on her upcoming appointment     _____________________________________________ Please note:  Patient was evaluated in Emergency Department today for the symptoms described in the history of present illness. Patient was evaluated in the context of the global COVID-19 pandemic, which necessitated consideration that the patient might be at risk for infection with the SARS-CoV-2 virus that causes COVID-19. Institutional protocols and algorithms that pertain to the evaluation of patients at risk for COVID-19 are in a state of rapid change based on information released by regulatory bodies including the CDC and federal and state organizations. These policies and algorithms were followed during the patient's care in the ED.  Some ED  evaluations and interventions may be delayed as a result of limited staffing during the pandemic.   [place] Controlled Substance Database was reviewed by me. ____________________________________________   FINAL CLINICAL IMPRESSION(S) / ED DIAGNOSES   Final diagnoses:  Dehydration      NEW MEDICATIONS STARTED DURING THIS VISIT:  ED Discharge Orders    None       Note:  This document was prepared using Dragon voice recognition software and may include unintentional dictation errors.    Alfred Levins, [place], MD 03/17/20 [id number]

## 2020-03-17 NOTE — ED Notes (Signed)
Patient discharged to home per MD order. Patient in stable condition, and deemed medically cleared by ED provider for discharge. Discharge instructions reviewed with patient/family using "Teach Back"; verbalized understanding of medication education and administration, and information about follow-up care. Denies further concerns. ° °

## 2020-03-17 NOTE — Discharge Instructions (Addendum)
Make sure to drink plenty of fluids.  Follow-up with Dr. Mike Gip in 2 days.  Return to the emergency room if you have chest pain, shortness of breath, cough, dizziness, nausea, vomiting, abdominal pain, fever.

## 2020-03-18 DIAGNOSIS — B3731 Acute candidiasis of vulva and vagina: Secondary | ICD-10-CM | POA: Insufficient documentation

## 2020-03-18 DIAGNOSIS — I959 Hypotension, unspecified: Secondary | ICD-10-CM | POA: Insufficient documentation

## 2020-03-18 DIAGNOSIS — B373 Candidiasis of vulva and vagina: Secondary | ICD-10-CM | POA: Insufficient documentation

## 2020-03-20 ENCOUNTER — Inpatient Hospital Stay: Payer: Medicare PPO

## 2020-03-20 ENCOUNTER — Other Ambulatory Visit: Payer: Self-pay

## 2020-03-20 VITALS — BP 124/76 | HR 80 | Temp 96.8°F | Resp 18

## 2020-03-20 DIAGNOSIS — C9000 Multiple myeloma not having achieved remission: Secondary | ICD-10-CM

## 2020-03-20 DIAGNOSIS — R110 Nausea: Secondary | ICD-10-CM

## 2020-03-20 DIAGNOSIS — M87180 Osteonecrosis due to drugs, jaw: Secondary | ICD-10-CM | POA: Diagnosis not present

## 2020-03-20 DIAGNOSIS — R5081 Fever presenting with conditions classified elsewhere: Secondary | ICD-10-CM | POA: Diagnosis not present

## 2020-03-20 DIAGNOSIS — D709 Neutropenia, unspecified: Secondary | ICD-10-CM | POA: Diagnosis not present

## 2020-03-20 DIAGNOSIS — Z95828 Presence of other vascular implants and grafts: Secondary | ICD-10-CM

## 2020-03-20 DIAGNOSIS — T50995A Adverse effect of other drugs, medicaments and biological substances, initial encounter: Secondary | ICD-10-CM | POA: Diagnosis not present

## 2020-03-20 DIAGNOSIS — Z1152 Encounter for screening for COVID-19: Secondary | ICD-10-CM | POA: Diagnosis not present

## 2020-03-20 DIAGNOSIS — R809 Proteinuria, unspecified: Secondary | ICD-10-CM | POA: Diagnosis not present

## 2020-03-20 DIAGNOSIS — Z5112 Encounter for antineoplastic immunotherapy: Secondary | ICD-10-CM | POA: Diagnosis not present

## 2020-03-20 DIAGNOSIS — D649 Anemia, unspecified: Secondary | ICD-10-CM | POA: Diagnosis not present

## 2020-03-20 LAB — CBC WITH DIFFERENTIAL/PLATELET
Abs Immature Granulocytes: 0.02 10*3/uL (ref 0.00–0.07)
Basophils Absolute: 0.1 10*3/uL (ref 0.0–0.1)
Basophils Relative: 1 %
Eosinophils Absolute: 0.4 10*3/uL (ref 0.0–0.5)
Eosinophils Relative: 9 %
HCT: 31.1 % — ABNORMAL LOW (ref 36.0–46.0)
Hemoglobin: 10.3 g/dL — ABNORMAL LOW (ref 12.0–15.0)
Immature Granulocytes: 0 %
Lymphocytes Relative: 17 %
Lymphs Abs: 0.8 10*3/uL (ref 0.7–4.0)
MCH: 29.1 pg (ref 26.0–34.0)
MCHC: 33.1 g/dL (ref 30.0–36.0)
MCV: 87.9 fL (ref 80.0–100.0)
Monocytes Absolute: 0.3 10*3/uL (ref 0.1–1.0)
Monocytes Relative: 7 %
Neutro Abs: 3.3 10*3/uL (ref 1.7–7.7)
Neutrophils Relative %: 66 %
Platelets: 126 10*3/uL — ABNORMAL LOW (ref 150–400)
RBC: 3.54 MIL/uL — ABNORMAL LOW (ref 3.87–5.11)
RDW: 13.9 % (ref 11.5–15.5)
WBC: 5 10*3/uL (ref 4.0–10.5)
nRBC: 0 % (ref 0.0–0.2)

## 2020-03-20 LAB — BASIC METABOLIC PANEL
Anion gap: 10 (ref 5–15)
BUN: 22 mg/dL (ref 8–23)
CO2: 25 mmol/L (ref 22–32)
Calcium: 9.1 mg/dL (ref 8.9–10.3)
Chloride: 99 mmol/L (ref 98–111)
Creatinine, Ser: 1.08 mg/dL — ABNORMAL HIGH (ref 0.44–1.00)
GFR calc Af Amer: >60 mL/min (ref >60)
GFR calc non Af Amer: 55 mL/min — ABNORMAL LOW (ref >60)
Glucose, Bld: 128 mg/dL — ABNORMAL HIGH (ref 70–99)
Potassium: 3.9 mmol/L (ref 3.5–5.1)
Sodium: 134 mmol/L — ABNORMAL LOW (ref 135–145)

## 2020-03-20 LAB — MAGNESIUM: Magnesium: 1 mg/dL — ABNORMAL LOW (ref 1.7–2.4)

## 2020-03-20 MED ORDER — HEPARIN SOD (PORK) LOCK FLUSH 100 UNIT/ML IV SOLN
500.0000 [IU] | Freq: Once | INTRAVENOUS | Status: AC | PRN
  Administered 2020-03-20: 500 [IU]
  Filled 2020-03-20: qty 5

## 2020-03-20 MED ORDER — MAGNESIUM SULFATE 2 GM/50ML IV SOLN
2.0000 g | Freq: Once | INTRAVENOUS | Status: AC
  Administered 2020-03-20: 2 g via INTRAVENOUS
  Filled 2020-03-20: qty 50

## 2020-03-20 MED ORDER — PROMETHAZINE HCL 25 MG/ML IJ SOLN
25.0000 mg | Freq: Once | INTRAMUSCULAR | Status: AC
  Administered 2020-03-20: 25 mg via INTRAVENOUS
  Filled 2020-03-20: qty 1

## 2020-03-20 MED ORDER — HEPARIN SOD (PORK) LOCK FLUSH 100 UNIT/ML IV SOLN
500.0000 [IU] | Freq: Once | INTRAVENOUS | Status: DC
  Filled 2020-03-20: qty 5

## 2020-03-20 MED ORDER — SODIUM CHLORIDE 0.9 % IV SOLN: 6.0000 g | Freq: Once | INTRAVENOUS | Status: DC

## 2020-03-20 MED ORDER — SODIUM CHLORIDE 0.9% FLUSH
10.0000 mL | INTRAVENOUS | Status: DC | PRN
  Filled 2020-03-20: qty 10

## 2020-03-20 MED ORDER — SODIUM CHLORIDE 0.9 % IV SOLN
Freq: Once | INTRAVENOUS | Status: AC
  Filled 2020-03-20: qty 250

## 2020-03-20 MED ORDER — MAGNESIUM SULFATE 4 GM/100ML IV SOLN
4.0000 g | Freq: Once | INTRAVENOUS | Status: AC
  Administered 2020-03-20: 4 g via INTRAVENOUS
  Filled 2020-03-20: qty 100

## 2020-03-20 MED ORDER — SODIUM CHLORIDE 0.9% FLUSH
10.0000 mL | Freq: Once | INTRAVENOUS | Status: AC | PRN
  Administered 2020-03-20: 10 mL
  Filled 2020-03-20: qty 10

## 2020-03-22 ENCOUNTER — Ambulatory Visit: Payer: Medicare PPO | Admitting: Physical Therapy

## 2020-03-22 ENCOUNTER — Encounter: Payer: Self-pay | Admitting: Physical Therapy

## 2020-03-22 ENCOUNTER — Other Ambulatory Visit: Payer: Self-pay

## 2020-03-22 ENCOUNTER — Inpatient Hospital Stay: Payer: Medicare PPO

## 2020-03-22 VITALS — BP 110/69 | HR 78 | Temp 96.7°F | Resp 18

## 2020-03-22 DIAGNOSIS — R5081 Fever presenting with conditions classified elsewhere: Secondary | ICD-10-CM | POA: Diagnosis not present

## 2020-03-22 DIAGNOSIS — Z1152 Encounter for screening for COVID-19: Secondary | ICD-10-CM | POA: Diagnosis not present

## 2020-03-22 DIAGNOSIS — R202 Paresthesia of skin: Secondary | ICD-10-CM | POA: Diagnosis not present

## 2020-03-22 DIAGNOSIS — D709 Neutropenia, unspecified: Secondary | ICD-10-CM | POA: Diagnosis not present

## 2020-03-22 DIAGNOSIS — C9000 Multiple myeloma not having achieved remission: Secondary | ICD-10-CM | POA: Diagnosis not present

## 2020-03-22 DIAGNOSIS — M87180 Osteonecrosis due to drugs, jaw: Secondary | ICD-10-CM | POA: Diagnosis not present

## 2020-03-22 DIAGNOSIS — D649 Anemia, unspecified: Secondary | ICD-10-CM | POA: Diagnosis not present

## 2020-03-22 DIAGNOSIS — G894 Chronic pain syndrome: Secondary | ICD-10-CM | POA: Diagnosis not present

## 2020-03-22 DIAGNOSIS — R200 Anesthesia of skin: Secondary | ICD-10-CM | POA: Diagnosis not present

## 2020-03-22 DIAGNOSIS — T50995A Adverse effect of other drugs, medicaments and biological substances, initial encounter: Secondary | ICD-10-CM | POA: Diagnosis not present

## 2020-03-22 DIAGNOSIS — R809 Proteinuria, unspecified: Secondary | ICD-10-CM | POA: Diagnosis not present

## 2020-03-22 DIAGNOSIS — Z5112 Encounter for antineoplastic immunotherapy: Secondary | ICD-10-CM | POA: Diagnosis not present

## 2020-03-22 DIAGNOSIS — M6281 Muscle weakness (generalized): Secondary | ICD-10-CM

## 2020-03-22 LAB — CULTURE, BLOOD (ROUTINE X 2): Culture: NO GROWTH

## 2020-03-22 MED ORDER — DIPHENHYDRAMINE HCL 25 MG PO CAPS
ORAL_CAPSULE | ORAL | Status: AC
  Filled 2020-03-22: qty 1

## 2020-03-22 MED ORDER — PROMETHAZINE HCL 25 MG/ML IJ SOLN
25.0000 mg | Freq: Once | INTRAMUSCULAR | Status: AC
  Administered 2020-03-22: 25 mg via INTRAVENOUS
  Filled 2020-03-22: qty 1

## 2020-03-22 MED ORDER — SODIUM CHLORIDE 0.9 % IV SOLN
Freq: Once | INTRAVENOUS | Status: AC
  Filled 2020-03-22: qty 250

## 2020-03-22 MED ORDER — PROMETHAZINE HCL 25 MG PO TABS: 25.0000 mg | ORAL_TABLET | Freq: Four times a day (QID) | ORAL | 0 refills | Status: DC | PRN

## 2020-03-22 MED ORDER — IMMUNE GLOBULIN (HUMAN) 10 GM/100ML IV SOLN
312.0000 mg/kg | Freq: Once | INTRAVENOUS | Status: AC
  Administered 2020-03-22: 20 g via INTRAVENOUS
  Filled 2020-03-22: qty 200

## 2020-03-22 MED ORDER — ACETAMINOPHEN 325 MG PO TABS
650.0000 mg | ORAL_TABLET | ORAL | Status: DC
  Administered 2020-03-22: 650 mg via ORAL
  Filled 2020-03-22: qty 2

## 2020-03-22 MED ORDER — DEXTROSE 5 % IV SOLN
INTRAVENOUS | Status: DC
  Filled 2020-03-22: qty 250

## 2020-03-22 MED ORDER — DIPHENHYDRAMINE HCL 25 MG PO TABS
25.0000 mg | ORAL_TABLET | ORAL | Status: AC
  Administered 2020-03-22: 25 mg via ORAL
  Filled 2020-03-22: qty 1

## 2020-03-22 NOTE — Therapy (Signed)
[place] [facility] [address] [place], [place], [address] Phone: [phone]   Fax:  [phone]  Physical Therapy Treatment  Patient Details  Name: Sarah [name] MRN: [id number] Date of Birth: 1955/08/01 Referring Provider (PT): Nolon Stalls, MD   Encounter Date: 03/22/2020   PT End of Session - 03/22/20 0741    Visit Number 5    Number of Visits 9    Date for PT Re-Evaluation 04/04/20    Authorization - Visit Number 5    Authorization - Number of Visits 10    PT Start Time 0735    PT Stop Time 0830    PT Time Calculation (min) 55 min    Activity Tolerance Patient tolerated treatment well    Behavior During Therapy [facility] for tasks assessed/performed           Past Medical History:  Diagnosis Date  . Abnormal stress test 02/14/2016   Overview:  Added automatically from request for surgery [id number]  . Anemia   . Anxiety   . Arthritis   . Bicuspid aortic valve   . CHF (congestive heart failure) ([place])   . CKD (chronic kidney disease) stage 3, GFR 30-59 ml/min   . Depression   . Diabetes mellitus ([place])   . Dizziness   . Fatty liver   . Frequent falls   . GERD (gastroesophageal reflux disease)   . Gout   . Heart murmur   . History of blood transfusion   . History of bone marrow transplant ([place])   . History of uterine fibroid   . Hx of cardiac catheterization 06/05/2016   Overview:  Normal coronaries 2017  . Hypertension   . Hypomagnesemia   . Multiple myeloma ([place])   . Personal history of chemotherapy   . Renal cyst     Past Surgical History:  Procedure Laterality Date  . ABDOMINAL HYSTERECTOMY    . 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 CARDIAC ELECTROPHYSIOLOGY MAPPING AND ABLATION    . CARPAL TUNNEL RELEASE Bilateral   . CHOLECYSTECTOMY  2008  . COLONOSCOPY WITH PROPOFOL N/A 05/07/2017   Procedure: COLONOSCOPY WITH PROPOFOL;  Surgeon: [name], MD;  Location: [facility] ENDOSCOPY;  Service: Gastroenterology;   Laterality: N/A;  . ESOPHAGOGASTRODUODENOSCOPY (EGD) WITH PROPOFOL N/A 05/07/2017   Procedure: ESOPHAGOGASTRODUODENOSCOPY (EGD) WITH PROPOFOL;  Surgeon: [name], MD;  Location: [facility] ENDOSCOPY;  Service: Gastroenterology;  Laterality: N/A;  . FOOT SURGERY Bilateral   . INCONTINENCE SURGERY  2009  . INTERSTIM IMPLANT PLACEMENT    . other     over active bladder  . OTHER SURGICAL HISTORY     bladder stimulator   . PARTIAL HYSTERECTOMY  03/1996   fibroids  . PORTA CATH INSERTION N/A 03/10/2019   Procedure: PORTA CATH INSERTION;  Surgeon: Algernon Huxley, MD;  Location: [facility];  Service: Cardiovascular;  Laterality: N/A;  . TONSILLECTOMY  2007    There were no vitals filed for this visit.   Subjective Assessment - 03/22/20 0739    Subjective Pt. was recently admitted for dehydration, has been feeling good since then. Pt. states she's been feeling slightly tired, as her new chemo "does not agree with her."    Pertinent History pt has multiple myeloma, diagnosed in 2016. has bladder stimulator.    Patient Stated Goals Increase strength    Currently in Pain? Yes    Pain Score 7     Pain Location Neck    Pain  Onset More than a month ago              There Ex:  Nustep L2 10 mins (unbilled)  Reviewed HEP and added new exercises: pt completed 1 rep of new exercises to ensure proper form  Exercises included: straight leg raise, supine chest press, overhead tricep press with no weight  Seated upper trap stretch: 2x30 sec each side  Manual:   STM to bilat cervical paraspinals and L upper trap: Pt in supine. Pt with initial 7/10 L neck pain, after STM pt reported pain to be 5/10.        PT Long Term Goals - 03/15/20 0742      PT LONG TERM GOAL #1   Title Pt. will be able to walk for 10 minutes without pain increasing above 3/10 in low back to improve functional mobility.    Baseline initial: pt can only walk 3-5 mins before onset of pain 6-7/10.; 9/15: pt able to  walk approx 1:40 before onset of back pain 3/10    Time 8    Period Weeks    Status Partially Met    Target Date 04/04/20      PT LONG TERM GOAL #2   Title Pt will improve 5XSTS to 15 seconds or less to improve overall strength and functional mobility.    Baseline initial: 25.57s;. 9/15: 16.34 sec    Time 8    Period Weeks    Status Partially Met    Target Date 04/04/20      PT LONG TERM GOAL #3   Title Pt will improve global strength to at least 4/5 to improve functional mobility.    Baseline R/L 4/4 Traps, 4-/4- Shoulder abduction, 3/4 Shoulder ER, 3/4 Shoulder IR, 4/4+ Biceps, 3+/4 Triceps, 3+/3+ Hip flexion, 4-/4- Hip abduction, 5/5 Hip adduction (seated), 5/5 Knee extension, 4-/4- Knee flexion, 4/4 Ankle dorsiflexion; 9/15: R/L. 5/5 traps, 3+/3+ shoulder abd, 3+/3+ shoulder flex 3+/3+ shoulder ER, 3+/3+ shoulder IR, 4/4 Biceps, 3+/3+ Triceps, 3+/3+ Hip flexion, 4/4 Hip abd, 4+/4+ Hip add, 4+/5 knee ext, 4/4 knee flex, 4/4+ ankle dorsiflexion    Time 8    Period Weeks    Status Partially Met    Target Date 04/04/20                 Plan - 03/22/20 0818    Clinical Impression Statement Pt. returns to therapy after ED visit for dehydration. Pt. reported today having 7/10 neck pain on L side. PT performed STM on L cervical paraspinlas and upper trap, pt responded well to manual therapy. Pt. left session with pain at 5/10. PT and pt updated pt's HEP and explained how she should pick 2-3 per category of upper body, abdominals, and lower body exercises. Pt. will continue to benefit from skilled PT to improve global strength to improve pain free functional mobility and ability to complete ADLs.    Personal Factors and Comorbidities Comorbidity 2;Comorbidity 1    Comorbidities cancer    Stability/Clinical Decision Making Evolving/Moderate complexity    Clinical Decision Making Moderate    Rehab Potential Good    PT Frequency 1x / week    PT Duration 8 weeks    PT  Treatment/Interventions ADLs/Self Care Home Management;Cryotherapy;Neuromuscular re-education;Balance training;Therapeutic exercise;Therapeutic activities;Functional mobility training;Gait training;Patient/family education;Manual techniques    PT Next Visit Plan core progression, LE nerve glides    PT Home Exercise Plan [id number]    Consulted and Agree with Plan of Care Patient  Patient will benefit from skilled therapeutic intervention in order to improve the following deficits and impairments:  Abnormal gait, Decreased activity tolerance, Decreased balance, Decreased mobility, Decreased strength, Impaired sensation, Postural dysfunction, Difficulty walking  Visit Diagnosis: Muscle weakness (generalized)  Chronic pain syndrome  Numbness and tingling     Problem List Patient Active Problem List   Diagnosis Date Noted  . Yeast vaginitis 03/18/2020  . Hypotension 03/18/2020  . Hypokalemia 03/11/2020  . Malnutrition of moderate degree 03/03/2020  . Acute lower UTI 03/03/2020  . Antineoplastic chemotherapy induced pancytopenia ([place]) 03/03/2020  . Dyspnea 03/02/2020  . Physical deconditioning 02/07/2020  . Impaired nutrition 02/07/2020  . 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 Acute renal insufficiency 01/24/2020  . Sepsis ([place]) 11/13/2019  . Pneumonia of right lung due to infectious organism 11/13/2019  . Constipation 10/24/2019  . AKI (acute kidney injury) ([place]) 10/15/2019  . Neutropenic fever ([place]) 10/15/2019  . Febrile neutropenia ([place]) 10/15/2019  . Encounter for antineoplastic immunotherapy 10/03/2019  . Hypocalcemia 08/28/2019  . Stage 3 chronic kidney disease 05/12/2019  . Chronic nausea 12/17/2018  . Chemotherapy-induced neuropathy ([place]) 12/17/2018  . Hyperlipidemia associated with type 2 diabetes mellitus ([place]) 11/25/2018  . Normocytic anemia 10/21/2018  . Hypomagnesemia 08/20/2018  . Goals of care, counseling/discussion 08/20/2018  . B12 deficiency  08/20/2018  . Thrombocytopenia ([place]) 02/09/2018  . Benign essential HTN 08/21/2017  . Cardiomyopathy, idiopathic ([place]) 08/21/2017  . Carotid artery stenosis, asymptomatic, bilateral 08/06/2017  . Thyroid nodule 08/06/2017  . Cardiac syncope 07/25/2017  . Iron deficiency anemia due to chronic blood loss 05/13/2017  . Spinal stenosis of lumbar region with neurogenic claudication 04/09/2017  . 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 LBBB (left bundle branch block) 10/10/2016  . Long term prescription opiate use 09/07/2016  . Chronic pain syndrome 07/08/2016  . Pain medication agreement signed 07/08/2016  . Spondylosis of lumbar region without myelopathy or radiculopathy 07/08/2016  . Chronic diastolic CHF (congestive heart failure), NYHA class 2 ([place]) 06/05/2016  . LVH (left ventricular hypertrophy) due to hypertensive disease, with heart failure ([place]) 06/05/2016  . Mild aortic valve stenosis 06/05/2016  . 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[place]) 06/05/2016  . Environmental and seasonal allergies 04/19/2016  . Insomnia 04/19/2016  . Bicuspid aortic valve 04/19/2016  . Asthma 04/19/2016  . Aortic regurgitation 04/19/2016  . Type II diabetes mellitus with complication ([place]) 03/47/4259  . Depression, major, single episode, in partial remission ([place]) 04/12/2016  . Irritable bowel syndrome (IBS) 04/12/2016  . Hepatic steatosis 04/12/2016  . Multiple myeloma not having achieved remission ([place]) 04/02/2016  . 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[place]) 07/06/2015  . Stem cell transplant candidate 05/16/2015    Pura Spice, PT, DPT # [id number], SPT 03/22/2020, 8:45 AM  [place] [facility] [address] [place], [place], [address] Phone: [phone]   Fax:  [phone]  Name: Aza Dantes MRN: [id number] Date of Birth: [birth year]

## 2020-03-23 ENCOUNTER — Inpatient Hospital Stay: Payer: Medicare PPO

## 2020-03-23 DIAGNOSIS — D631 Anemia in chronic kidney disease: Secondary | ICD-10-CM | POA: Diagnosis not present

## 2020-03-23 DIAGNOSIS — I10 Essential (primary) hypertension: Secondary | ICD-10-CM | POA: Diagnosis not present

## 2020-03-23 DIAGNOSIS — C9000 Multiple myeloma not having achieved remission: Secondary | ICD-10-CM

## 2020-03-23 DIAGNOSIS — D801 Nonfamilial hypogammaglobulinemia: Secondary | ICD-10-CM

## 2020-03-23 DIAGNOSIS — N182 Chronic kidney disease, stage 2 (mild): Secondary | ICD-10-CM | POA: Diagnosis not present

## 2020-03-23 DIAGNOSIS — E1122 Type 2 diabetes mellitus with diabetic chronic kidney disease: Secondary | ICD-10-CM | POA: Diagnosis not present

## 2020-03-23 DIAGNOSIS — N183 Chronic kidney disease, stage 3 unspecified: Secondary | ICD-10-CM

## 2020-03-23 DIAGNOSIS — D709 Neutropenia, unspecified: Secondary | ICD-10-CM | POA: Diagnosis not present

## 2020-03-23 DIAGNOSIS — Z9484 Stem cells transplant status: Secondary | ICD-10-CM

## 2020-03-23 DIAGNOSIS — R5081 Fever presenting with conditions classified elsewhere: Secondary | ICD-10-CM | POA: Diagnosis not present

## 2020-03-23 DIAGNOSIS — Z5112 Encounter for antineoplastic immunotherapy: Secondary | ICD-10-CM | POA: Diagnosis not present

## 2020-03-23 DIAGNOSIS — T50995A Adverse effect of other drugs, medicaments and biological substances, initial encounter: Secondary | ICD-10-CM | POA: Diagnosis not present

## 2020-03-23 DIAGNOSIS — Z1152 Encounter for screening for COVID-19: Secondary | ICD-10-CM | POA: Diagnosis not present

## 2020-03-23 DIAGNOSIS — M87180 Osteonecrosis due to drugs, jaw: Secondary | ICD-10-CM | POA: Diagnosis not present

## 2020-03-23 DIAGNOSIS — R809 Proteinuria, unspecified: Secondary | ICD-10-CM | POA: Diagnosis not present

## 2020-03-23 DIAGNOSIS — D649 Anemia, unspecified: Secondary | ICD-10-CM | POA: Diagnosis not present

## 2020-03-23 LAB — BASIC METABOLIC PANEL
Anion gap: 10 (ref 5–15)
BUN: 18 mg/dL (ref 8–23)
CO2: 24 mmol/L (ref 22–32)
Calcium: 9.6 mg/dL (ref 8.9–10.3)
Chloride: 101 mmol/L (ref 98–111)
Creatinine, Ser: 0.95 mg/dL (ref 0.44–1.00)
GFR calc Af Amer: >60 mL/min (ref >60)
GFR calc non Af Amer: >60 mL/min (ref >60)
Glucose, Bld: 145 mg/dL — ABNORMAL HIGH (ref 70–99)
Potassium: 4.2 mmol/L (ref 3.5–5.1)
Sodium: 135 mmol/L (ref 135–145)

## 2020-03-23 LAB — PHOSPHORUS: Phosphorus: 3.5 mg/dL (ref 2.5–4.6)

## 2020-03-23 LAB — MAGNESIUM: Magnesium: 1.3 mg/dL — ABNORMAL LOW (ref 1.7–2.4)

## 2020-03-23 MED ORDER — HEPARIN SOD (PORK) LOCK FLUSH 100 UNIT/ML IV SOLN
500.0000 [IU] | Freq: Once | INTRAVENOUS | Status: AC | PRN
  Administered 2020-03-23: 500 [IU]
  Filled 2020-03-23: qty 5

## 2020-03-23 MED ORDER — MAGNESIUM SULFATE 4 GM/100ML IV SOLN
4.0000 g | Freq: Once | INTRAVENOUS | Status: AC
  Administered 2020-03-23: 4 g via INTRAVENOUS
  Filled 2020-03-23: qty 100

## 2020-03-23 MED ORDER — PROMETHAZINE HCL 25 MG/ML IJ SOLN
INTRAMUSCULAR | Status: AC
  Filled 2020-03-23: qty 1

## 2020-03-23 MED ORDER — MAGNESIUM SULFATE 2 GM/50ML IV SOLN
2.0000 g | Freq: Once | INTRAVENOUS | Status: AC
  Administered 2020-03-23: 2 g via INTRAVENOUS
  Filled 2020-03-23: qty 50

## 2020-03-23 MED ORDER — SODIUM CHLORIDE 0.9 % IV SOLN
Freq: Once | INTRAVENOUS | Status: AC
  Filled 2020-03-23: qty 250

## 2020-03-23 MED ORDER — SODIUM CHLORIDE 0.9% FLUSH
10.0000 mL | Freq: Once | INTRAVENOUS | Status: AC
  Administered 2020-03-23: 10 mL via INTRAVENOUS
  Filled 2020-03-23: qty 10

## 2020-03-23 MED ORDER — SODIUM CHLORIDE 0.9 % IV SOLN
6.0000 g | Freq: Once | INTRAVENOUS | Status: DC
  Filled 2020-03-23: qty 12

## 2020-03-23 MED ORDER — PROMETHAZINE HCL 25 MG/ML IJ SOLN
25.0000 mg | Freq: Once | INTRAMUSCULAR | Status: AC
  Administered 2020-03-23: 25 mg via INTRAVENOUS

## 2020-03-23 MED ORDER — SODIUM CHLORIDE 0.9% FLUSH
10.0000 mL | Freq: Once | INTRAVENOUS | Status: AC | PRN
  Administered 2020-03-23: 10 mL
  Filled 2020-03-23: qty 10

## 2020-03-24 LAB — PTH, INTACT AND CALCIUM
Calcium, Total (PTH): 8.8 mg/dL (ref 8.7–10.3)
PTH: 15 pg/mL (ref 15–65)

## 2020-03-24 LAB — ANA W/REFLEX: Anti Nuclear Antibody (ANA): NEGATIVE

## 2020-03-25 NOTE — Progress Notes (Signed)
[facility]  [address], [address] [place], [place] [address] Phone: [phone]  Fax: [phone]   Clinic Day:  03/27/2020   Referring physician: Glean Hess, MD  Chief Complaint: Sarah [name] is a 64 y.o. 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    HPI: The patient was last seen in the medical oncology clinic on 03/16/2020 for a sick call visit.  At that time, she felt achy.  She denied fever. Hematocrit was 27.8, hemoglobin 9.2, platelets 136,000, WBC 6,700. Creatinine 1.36.  Magnesium was 1.2.  Patient was initially hypotensive (67/43).  Lactic acid was 2.5 (elevated). Patient received magnesium 6 gm IV. Cycle #3 IVIG was postponed; she was transferred to [facility] ER for evaluation, blood cultures and ongoing observation.   Urinalysis and culture were negative.  She was evaluated in the ER. Patient reported a recent mild illness last week where she had a cough and some constipation.  She was hypotensive with blood pressure of 63/40. She received IV fluids, received her infusion. Patient reported that her blood pressure was usually in the low 100s.  She had been eating and drinking well.  Blood cultures were negative.  Repeat lactic acid was 1.6 (normal).  She had a normal white count with a normal ANC, no left shift.  CMP was consistent with mild AKI.  There were no electrolyte derangements.  UA was negative for UTI.  CXR showed no evidence of pneumonia. She received 1 L NS. Possibly dehydration with AKI vs infection although all infectious markers seemed negative. While receiving fluids patient started to complain of intermittent short lived sharp chest pain. She had no SOB or cough. EKG was normal. Troponin was cycled.  She received IVIG on 03/22/2020 with pre and post fluids.   Labs followed: 03/20/2020:  Hematocrit 31.1, hemoglobin 10.3, platelets 126,000, WBC 5,000. Sodium 134. Creatinine 1.08. Magnesium 1.0.  03/23/2020: Sodium was 135.  Creatinine 0.95.  Magnesium 1.3. ANA was negative.   During the interim, she was doing "just fine". She notes drinking plenty water and staying well hydrated. Her calcium is low at 8.7 today. She states taking calcium along with vitamin D. She agrees to increasing her calcium intake.  She continues to use biotin. She denies dry mouth. Her nausea and neuropathy are stable. Her vaginal yeast infection has resolved. Her blood sugar is well controlled. She took her last Pomalyst yesterday. She reports taking all pre-meds this morning except Decadron.   She will see Dr. Virgina Norfolk on 04/04/2020. She thinks she will have pre-transplant testing on 04/19/2020 if all goes well.     Past Medical History:  Diagnosis Date  . Abnormal stress test 02/14/2016   Overview:  Added automatically from request for surgery [id number]  . Anemia   . Anxiety   . Arthritis   . Bicuspid aortic valve   . CHF (congestive heart failure) ([place])   . CKD (chronic kidney disease) stage 3, GFR 30-59 ml/min   . Depression   . Diabetes mellitus ([place])   . Dizziness   . Fatty liver   . Frequent falls   . GERD (gastroesophageal reflux disease)   . Gout   . Heart murmur   . History of blood transfusion   . History of bone marrow transplant ([place])   . History of uterine fibroid   . Hx of  cardiac catheterization 06/05/2016   Overview:  Normal coronaries 2017  . Hypertension   . Hypomagnesemia   . Multiple myeloma ([place])   . Personal history of chemotherapy   . Renal cyst     Past Surgical History:  Procedure Laterality Date  . ABDOMINAL HYSTERECTOMY    . 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 CARDIAC ELECTROPHYSIOLOGY MAPPING AND ABLATION    . CARPAL TUNNEL RELEASE Bilateral   . CHOLECYSTECTOMY  2008  . COLONOSCOPY WITH PROPOFOL N/A 05/07/2017   Procedure: COLONOSCOPY WITH PROPOFOL;   Surgeon: [name], MD;  Location: [facility] ENDOSCOPY;  Service: Gastroenterology;  Laterality: N/A;  . ESOPHAGOGASTRODUODENOSCOPY (EGD) WITH PROPOFOL N/A 05/07/2017   Procedure: ESOPHAGOGASTRODUODENOSCOPY (EGD) WITH PROPOFOL;  Surgeon: [name], MD;  Location[facility] ENDOSCOPY;  Service: Gastroenterology;  Laterality: N/A;  . FOOT SURGERY Bilateral   . INCONTINENCE SURGERY  2009  . INTERSTIM IMPLANT PLACEMENT    . other     over active bladder  . OTHER SURGICAL HISTORY     bladder stimulator   . PARTIAL HYSTERECTOMY  03/1996   fibroids  . PORTA CATH INSERTION N/A 03/10/2019   Procedure: PORTA CATH INSERTION;  Surgeon: Algernon Huxley, MD;  Location: [place] CV LAB;  Service: Cardiovascular;  Laterality: N/A;  . TONSILLECTOMY  2007    Family History  Problem Relation Age of Onset  . Colon cancer Father   . Renal Disease Father   . Diabetes Mellitus II Father   . Melanoma Paternal Grandmother   . Breast cancer Maternal Aunt 2  . Anemia Mother   . Heart disease Mother   . Heart failure Mother   . Renal Disease Mother   . Congestive Heart Failure Mother   . Heart disease Maternal Uncle   . Throat cancer Maternal Uncle   . Lung cancer Maternal Uncle   . Liver disease Maternal Uncle   . Heart failure Maternal Uncle   . Hearing loss Son 71       Suicide     Social History:  reports that she quit smoking about 28 years ago. Her smoking use included cigarettes. She has a 20.00 pack-year smoking history. She has never used smokeless tobacco. She reports current alcohol use. She reports that she does not use drugs. Patient has not had ETOH in several months. She is on disability. She notes exposure to perchloroethylene [facility]).She lives much of her adult life in [place]. She was married with 2 sons. Her husband passed away. Her 2 sons took their own lives (age 14 and 17). She worked in [employer] in [employer]. She went back to school and earned an [employer] and  works for [employer] for several years.She lives[place] sistersin [place]. The patient is alone today.   Allergies:  Allergies  Allergen Reactions  . Oxycodone-Acetaminophen Anaphylaxis    Swelling and rash  . Celebrex [Celecoxib] Diarrhea  . Codeine   . Plerixafor     In 2016 during ASCT collection patient developed fever to 103.4F and required hospitalization  . Benadryl [Diphenhydramine] Palpitations  . Morphine Itching and Rash  . Ondansetron Diarrhea  . Tylenol [Acetaminophen] Itching and Rash    Current Medications: Current Outpatient Medications  Medication Sig Dispense Refill  . acetaminophen (TYLENOL) 500 MG tablet Take 500 mg by mouth every 6 (six) hours as needed.     Marland Kitchen acyclovir (ZOVIRAX) 400 MG tablet Take 1 tablet (400 mg total) by mouth 2 (two) times daily. [place]  tablet 11  . allopurinol (ZYLOPRIM) 100 MG tablet TAKE 1 TABLET(100 MG) BY MOUTH DAILY as needed 90 tablet 1  . aspirin 81 MG chewable tablet Chew 1 tablet (81 mg total) by mouth daily. 30 tablet 0  . fentaNYL (DURAGESIC - DOSED MCG/HR) 25 MCG/HR patch Place 25 mcg onto the skin every 3 (three) days.     . fexofenadine (ALLEGRA) 180 MG tablet TAKE 1 TABLET(180 MG) BY MOUTH DAILY 30 tablet 5  . FLUoxetine (PROZAC) 40 MG capsule TAKE 1 CAPSULE EVERY DAY 90 capsule 1  . glucose blood (ONE TOUCH ULTRA TEST) test strip     . lactulose (CHRONULAC) 10 GM/15ML solution Take 20 g by mouth daily as needed.     . lidocaine-prilocaine (EMLA) cream APPLY TOPICALLY AS NEEDED. 30 g 0  . metFORMIN (GLUCOPHAGE-XR) 500 MG 24 hr tablet TAKE 1 TABLET (500 MG TOTAL) BY MOUTH DAILY WITH BREAKFAST. 90 tablet 1  . Multiple Vitamin (MULTIVITAMIN) tablet Take 1 tablet by mouth daily.    . Multiple Vitamins-Minerals (HAIR/SKIN/NAILS) TABS Take 1 tablet by mouth daily.    Marland Kitchen omeprazole (PRILOSEC) 40 MG capsule TAKE 1 CAPSULE EVERY DAY 90 capsule 3  . promethazine (PHENERGAN) 25 MG tablet Take 1 tablet (25 mg total) by mouth every 6 (six)  hours as needed for nausea or vomiting. 90 tablet 0  . promethazine-dextromethorphan (PROMETHAZINE-DM) 6.25-15 MG/5ML syrup Take 5 mLs by mouth every 8 (eight) hours as needed for cough. 240 mL 0  . tiZANidine (ZANAFLEX) 4 MG tablet Take 1 tablet (4 mg total) by mouth every 8 (eight) hours as needed for muscle spasms. 270 tablet 1  . traZODone (DESYREL) 100 MG tablet TAKE 1 TABLET AT BEDTIME  FOR  SLEEP 90 tablet 0  . dexamethasone (DECADRON) 4 MG tablet Take 5 tablets (69m) on days 2, 16 of cycle 3,4, 5 and 6 Take 5 tablets (277m on days 2 of cycles 7 and beyond (Patient not taking: Reported on 03/27/2020) 50 tablet 0  . diclofenac sodium (VOLTAREN) 1 % GEL Apply 2 g topically 4 (four) times daily. (Patient not taking: Reported on 03/27/2020) 100 g 1  . diphenhydrAMINE (BENADRYL) 50 MG capsule Take 50 mg by mouth as needed.     . diphenoxylate-atropine (LOMOTIL) 2.5-0.025 MG tablet Take 2 tablets by mouth daily as needed for diarrhea or loose stools. (Patient not taking: Reported on 03/01/2020) 45 tablet 2  . fluticasone (FLONASE) 50 MCG/ACT nasal spray Place 2 sprays into both nostrils daily. (Patient not taking: Reported on 03/27/2020) 16 g 2  . lisinopril (ZESTRIL) 10 MG tablet Take 10 mg by mouth once.  (Patient not taking: Reported on 03/01/2020)    . montelukast (SINGULAIR) 10 MG tablet Take 1 tablet by mouth on the day before treatment and for 2 days after treatment. (Patient not taking: Reported on 03/07/2020) 30 tablet 0  . NARCAN 4 MG/0.1ML LIQD nasal spray kit 1 spray.  (Patient not taking: Reported on 03/01/2020)    . promethazine-dextromethorphan (PROMETHAZINE-DM) 6.25-15 MG/5ML syrup Take 5 mLs by mouth 4 (four) times daily as needed for cough. (Patient not taking: Reported on 02/21/2020) 240 mL 0   No current facility-administered medications for this visit.   Facility-Administered Medications Ordered in Other Visits  Medication Dose Route Frequency Provider Last Rate Last Admin  . 0.9 %   sodium chloride infusion   Intravenous Continuous CoLequita AsalMD   Stopped at 01/20/20 1232  . heparin lock flush 100 unit/mL  500 Units Intravenous Once Faythe Casa E, NP      . sodium chloride flush (NS) 0.9 % injection 10 mL  10 mL Intravenous PRN Lequita Asal, MD   10 mL at 02/07/20 0815  . sodium chloride flush (NS) 0.9 % injection 10 mL  10 mL Intracatheter PRN Cammie Sickle, MD   10 mL at 02/10/20 0815  . sodium chloride flush (NS) 0.9 % injection 10 mL  10 mL Intravenous PRN Jacquelin Hawking, NP   10 mL at 03/01/20 1143    Review of Systems  Constitutional: Negative.  Negative for chills, diaphoresis, fever, malaise/fatigue and weight loss.       "Just fine".  HENT: Negative.  Negative for congestion, ear discharge, ear pain, hearing loss, nosebleeds, sinus pain, sore throat and tinnitus.        On Biotin.  Eyes: Negative.  Negative for blurred vision, double vision and photophobia.  Respiratory: Negative for cough, hemoptysis, sputum production, shortness of breath and wheezing.   Cardiovascular: Negative.  Negative for chest pain, palpitations and leg swelling.  Gastrointestinal: Positive for nausea (chronic). Negative for abdominal pain, blood in stool, constipation, diarrhea, heartburn (on Prilosec), melena and vomiting.  Genitourinary: Negative.  Negative for dysuria, frequency and urgency.       Vaginal yeast infection, resolved.  Musculoskeletal: Negative for falls, joint pain, myalgias and neck pain. Back pain: chronic.  Skin: Negative.  Negative for itching and rash.  Neurological: Positive for sensory change (chronic numbness in fingers and toes). Negative for dizziness, tingling, weakness and headaches.  Endo/Heme/Allergies: Negative for environmental allergies. Does not bruise/bleed easily.       Diabetes, well controllred.  Psychiatric/Behavioral: Negative.  Negative for memory loss. The patient is not nervous/anxious and does not have  insomnia.   All other systems reviewed and are negative.   Performance status (ECOG): 1  Vitals Blood pressure 131/61, pulse 73, temperature (!) 97.2 F (36.2 C), temperature source Tympanic, weight 143 lb 11.8 oz (65.2 kg), SpO2 100 %.  Physical Exam Vitals and nursing note reviewed.  Constitutional:      General: She is not in acute distress.    Appearance: She is well-developed. She is not ill-appearing, toxic-appearing or diaphoretic.     Interventions: Face mask in place.     Comments: Patient sitting comfortably in the infusion chair eating in no acute distress.  HENT:     Head: Normocephalic and atraumatic.     Comments: Short curly blonde hair.    Mouth/Throat:     Mouth: Mucous membranes are moist.     Pharynx: Oropharynx is clear.  Eyes:     General: No scleral icterus.    Extraocular Movements: Extraocular movements intact.     Conjunctiva/sclera: Conjunctivae normal.     Pupils: Pupils are equal, round, and reactive to light.     Comments: Glasses. Brown eyes.   Cardiovascular:     Rate and Rhythm: Normal rate and regular rhythm.     Heart sounds: Normal heart sounds. No murmur heard.   Pulmonary:     Effort: Pulmonary effort is normal. No respiratory distress.     Breath sounds: Normal breath sounds. No wheezing or rales.  Chest:     Chest wall: No tenderness.  Abdominal:     General: Bowel sounds are normal. There is no distension.     Palpations: Abdomen is soft. There is no hepatomegaly, splenomegaly or mass.     Tenderness: There is no  abdominal tenderness. There is no guarding or rebound.  Musculoskeletal:        General: No swelling or tenderness. Normal range of motion.     Cervical back: Normal range of motion and neck supple.  Lymphadenopathy:     Head:     Right side of head: No preauricular, posterior auricular or occipital adenopathy.     Left side of head: No preauricular, posterior auricular or occipital adenopathy.     Cervical: No cervical  adenopathy.     Upper Body:     Right upper body: No supraclavicular adenopathy.     Left upper body: No supraclavicular adenopathy.  Skin:    General: Skin is warm and dry.     Coloration: Skin is not jaundiced or pale.     Findings: No erythema.  Neurological:     Mental Status: She is alert and oriented to person, place, and time. Mental status is at baseline.  Psychiatric:        Behavior: Behavior normal.        Thought Content: Thought content normal.        Judgment: Judgment normal.    Infusion on 03/27/2020  Component Date Value Ref Range Status  . WBC 03/27/2020 5.3  4.0 - 10.5 K/uL Final  . RBC 03/27/2020 3.55* 3.87 - 5.11 MIL/uL Final  . Hemoglobin 03/27/2020 10.4* 12.0 - 15.0 g/dL Final  . HCT 03/27/2020 30.9* 36 - 46 % Final  . MCV 03/27/2020 87.0  80.0 - 100.0 fL Final  . MCH 03/27/2020 29.3  26.0 - 34.0 pg Final  . MCHC 03/27/2020 33.7  30.0 - 36.0 g/dL Final  . RDW 03/27/2020 13.9  11.5 - 15.5 % Final  . Platelets 03/27/2020 98* 150 - 400 K/uL Final   Comment: Immature Platelet Fraction may be clinically indicated, consider ordering this additional test [id number]   . nRBC 03/27/2020 0.0  0.0 - 0.2 % Final  . Neutrophils Relative % 03/27/2020 66  % Final  . Neutro Abs 03/27/2020 3.5  1.7 - 7.7 K/uL Final  . Lymphocytes Relative 03/27/2020 13  % Final  . Lymphs Abs 03/27/2020 0.7  0.7 - 4.0 K/uL Final  . Monocytes Relative 03/27/2020 10  % Final  . Monocytes Absolute 03/27/2020 0.5  0 - 1 K/uL Final  . Eosinophils Relative 03/27/2020 10  % Final  . Eosinophils Absolute 03/27/2020 0.5  0 - 0 K/uL Final  . Basophils Relative 03/27/2020 1  % Final  . Basophils Absolute 03/27/2020 0.0  0 - 0 K/uL Final  . Immature Granulocytes 03/27/2020 0  % Final  . Abs Immature Granulocytes 03/27/2020 0.02  0.00 - 0.07 K/uL Final   Performed at [facility], [address] [place], [place] [address]    Assessment:  Nazly Digilio is a 64 y.o. 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 Bone marrow revealed 80% plasma cells. Lambda free light chains were 1340. She had nephrotic range proteinuria. She initially underwent induction with RVD. Revlimid maintenance was discontinued on 01/21/2017 secondary to intolerance.   Bone marrowaspirate andbiopsyon 01/18/2021revealed anormocellularmarrow withbut increased lambda-restricted plasma cells (9% aspirate, 40% CD138 immunohistochemistry).Findingswereconsistent with recurrent plasma cell myeloma.Flow cytometry revealed no monoclonal B-cell or phenotypically aberrant T-cell population. Cytogeneticswere 32, XX (normal). 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  M-spikehas been followed: 0 on 04/02/2016 -06/23/2019; 0.2 on 09/02/2017, 0.1 on  11/25/2019, 0.1 on 01/10/2020, 0.2 on 02/07/2020, and 0.1 on 03/07/2020.  Lambda light chainshave been followed: 22.2 (ratio 0.56) on 07/03/2017, 30.8 (ratio 0.78) on 09/02/2017, 36.9 (ratio 0.40) on 10/21/2017, 37.4 (ratio 0.41) on 12/16/2017, 70.7(ratio 0.31) on 02/17/2018, 64.2 (ratio 0.27) on 04/07/2018, 78.9 (ratio 0.18) on 05/26/2018, 128.8 (ratio 0.17) on 08/06/2018, 181.5 (ratio 0.13) on 10/08/2018, 130.9 (ratio 0.13) on 10/20/2018, 160.7 (ratio 0.10)on 12/09/2018, 236.6 (ratio 0.07) on 02/01/2019, 363.6 (ratio 0.04) on 03/22/2019, 404.8 (ratio 0.04) on 04/05/2019, 420.7 (ratio 0.03) on 05/24/2019, 573.4 (ratio 0.03) on 06/23/2019, 451.05(ratio 0.02) on02/19/2021, 47.2 (ratio 0.13) on 10/25/2019, 22.4 (ratio 0.21) on 11/25/2019, 16.5 (ratio 0.33) on 01/10/2020, 14.6 (ratio 0.34) on 02/07/2020, and 13.1 (ratio 0.31) on 03/07/2020.  24 hour UPEPon 06/03/2019 revealed kappa free light chains95.76,lambda free light chains1,260.71, andratio 0.08.24 hourUPEPon 02/22/2021revealed totalproteinof721m/24 hrs withlambdafree  light chains 1,084.16mL andratioof0.10(1.03-31.76). M spike inurinewas46.1%(36164m4 hrs).  Bone surveyon 04/08/2016 and11/28/2018 revealed no definite lytic lesion seen in the visualized skeleton.Bone surveyon 11/19/2018 revealed no suspicious lucent lesionsand no acute bony abnormality.PET scanon 07/12/2019 revealed no focal metabolic activity to suggest active myeloma within the skeleton. There wereno lytic lesions identified on the CT portion of the examorsoft tissue plasmacytomas. There was no evidence of multiple myeloma.  Pretreatment RBC phenotypeon 09/23/2019 waspositivefor C, e, DUFFY B, KIDD B, M, S, and s antigen; negativefor c, E, KELL, DUFFY A, KIDD A, and N antigen.  Sheis day 14 ofcycle #6 daratumumab and hyaluronidase-fihj, Pomalyst, and Decadron (DPd)(09/27/2019 - 10/25/2019; 12/09/2019 - 03/13/2020). Cycle #1 was complicated by fever and neutropenia requiring admission.  Cycle #2 was complicated by pneumonia requiring admission.  Cycle #6 was complicated with an ER evaluation for an elevated lactic acid.  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 Zometa was discontinued in 01/2017. She has chronic nauseaon Phenergan.  She has B12 deficiency. B12 was 254 on 04/09/2017, 295 on 08/20/2018, and 391 on 10/08/2018. Shewasonoral B12.She received B12 monthly (last08/05/2020).Folate was18.6on 02/08/2019.  She has iron deficiency. Ferritin was 32 on 07/01/2019. She received Venoferon 07/15/2019 and 07/22/2019.  She has hypogammaglobulinemia.  IgG was 245 on 11/25/2019.  She received monthly IVIG (07/22/021 - 03/22/2020).  She received IVIG 400 mg/kg on 01/20/2020 and 200 mg/kg on 02/17/2020.  IVIG on 07/92/11/9417s complicated by acute renal failure. IgG trough level was 418 on 02/17/2020.  [facility] with fever and neutropenia. Cultures were negative. CXR was  negative. She received broad spectrum antibiotics and daily Granix. She received IVF for acute renal insufficiency due to diarrhea and dehydration. Creatine was 1.66 on admission and 1.12 on discharge.  [facility] with fever and neutropenia. CXR revealed no active cardiopulmonary disease. Chest CT with contrast revealed no acute intrathoracic pathology. There were findings which could be suggestive of prior granulomatous disease. She was treated with Cefepime and Vancomycin, then switched to ciprofloxacin on 03/03/2020.   She received her second COVID-19vaccineon 10/09/2019.  Symptomatically, she is doing well.  She denies any concerns.  Exam is stable.  Plan: 1.   Labs today:  CBC with diff, CMP, Mg. 2. Stage III multiple myeloma  Symptomatically, she is doing well.   Lambda free light chains have improved from451.05on02/19/2021. After cycle #1: 47.2. After cycle #5:13.1. Labs are reviewed. Begin day 15 of cycle #6 daratumumab, Pomalyst and Decadron. Neuropathy is stable.  Overall her energy level has improved.   She completed a 22 Pomalyst yesterday.   Platelet count is 98,000.   She is taking all of her premeds except Decadron.  Discuss symptom management.  She has antiemetics at home to use on a prn bases.  Interventions are adequate.     Patient notes plan for stem cell transplant reassessment on 04/04/2020. 3.   Normocytic anemia  Hematocrit 30.9. Hemoglobin 10.4. MCV 87 on 03/27/2020.  Ferritin51 on 01/10/2020. TSH was normal on02/20/2020.  Creatinine 1.07(stable).  Continue to monitor. 4. Grade II peripheral neuropathy  Neuropathy remains stable.  Overall energy level and performance status have improved. 5.   Hypogammaglobulinemia              Patient has had recurrent infections.               Shereceived her first dose ofIVIG(25 gm)on 01/20/2020 with subsequent renal insufficiency. Shereceived her second dose of IVIG(15 gm)on 08/19/2021without renal insufficiency.   She received her third dose of IVIG (20 gm) on 03/22/2020 without renal insufficiency. Infusion time is increased. She receives pre and post hydration the day of and the day after infusion. Goal IgGtrough level is 500.             Continue IVIG monthly. 6.   Chronic hypomagnesemia Magnesium1.2 today. Magnesium 6 g IV today.  Continue twice weekly magnesium checks with supplementation. 7.   Day 15 of cycle #6 daratumumab with premeds. 8.   RTC on Thursday for labs (Mg), IV Mg. 9.   RTC on Monday and Thursday next week for labs (Mg) and +/- IV Mg. Please add CBC with diff to 04/03/2020. 10.   RTC in 2 weeks for MD assessment, labs (CBC with diff, CMP, Mg, SPEP, FLCA), IV magnesium, and +/- day 1 of cycle #7 daratumumab (based on transplant plans).  I discussed the assessment and treatment plan with the patient.  The patient was provided an opportunity to ask questions and all were answered.  The patient agreed with the plan and demonstrated an understanding of the instructions.  The patient was advised to call back if the symptoms worsen or if the condition fails to improve as anticipated.    Lequita Asal, MD, PhD    03/27/2020, 8:44 AM   I, Selena Batten, am acting as scribe for [employer]. Mike Gip, MD, PhD.  I, Muna Demers C. Mike Gip, MD, have reviewed the above documentation for accuracy and completeness, and I agree with the above.

## 2020-03-27 ENCOUNTER — Other Ambulatory Visit: Payer: Self-pay

## 2020-03-27 ENCOUNTER — Ambulatory Visit: Payer: Medicare PPO

## 2020-03-27 ENCOUNTER — Inpatient Hospital Stay: Payer: Medicare PPO

## 2020-03-27 ENCOUNTER — Encounter: Payer: Self-pay | Admitting: Hematology and Oncology

## 2020-03-27 ENCOUNTER — Inpatient Hospital Stay (HOSPITAL_BASED_OUTPATIENT_CLINIC_OR_DEPARTMENT_OTHER): Payer: Medicare PPO | Admitting: Hematology and Oncology

## 2020-03-27 VITALS — BP 131/61 | HR 73 | Temp 97.2°F | Wt 143.7 lb

## 2020-03-27 DIAGNOSIS — D801 Nonfamilial hypogammaglobulinemia: Secondary | ICD-10-CM

## 2020-03-27 DIAGNOSIS — C9000 Multiple myeloma not having achieved remission: Secondary | ICD-10-CM | POA: Diagnosis not present

## 2020-03-27 DIAGNOSIS — Z5112 Encounter for antineoplastic immunotherapy: Secondary | ICD-10-CM

## 2020-03-27 DIAGNOSIS — Z7189 Other specified counseling: Secondary | ICD-10-CM

## 2020-03-27 DIAGNOSIS — Z9484 Stem cells transplant status: Secondary | ICD-10-CM

## 2020-03-27 DIAGNOSIS — R809 Proteinuria, unspecified: Secondary | ICD-10-CM | POA: Diagnosis not present

## 2020-03-27 DIAGNOSIS — Z1152 Encounter for screening for COVID-19: Secondary | ICD-10-CM | POA: Diagnosis not present

## 2020-03-27 DIAGNOSIS — D649 Anemia, unspecified: Secondary | ICD-10-CM | POA: Diagnosis not present

## 2020-03-27 DIAGNOSIS — R5081 Fever presenting with conditions classified elsewhere: Secondary | ICD-10-CM | POA: Diagnosis not present

## 2020-03-27 DIAGNOSIS — T50995A Adverse effect of other drugs, medicaments and biological substances, initial encounter: Secondary | ICD-10-CM | POA: Diagnosis not present

## 2020-03-27 DIAGNOSIS — M87180 Osteonecrosis due to drugs, jaw: Secondary | ICD-10-CM | POA: Diagnosis not present

## 2020-03-27 DIAGNOSIS — D709 Neutropenia, unspecified: Secondary | ICD-10-CM | POA: Diagnosis not present

## 2020-03-27 LAB — CBC WITH DIFFERENTIAL/PLATELET
Abs Immature Granulocytes: 0.02 10*3/uL (ref 0.00–0.07)
Basophils Absolute: 0 10*3/uL (ref 0.0–0.1)
Basophils Relative: 1 %
Eosinophils Absolute: 0.5 10*3/uL (ref 0.0–0.5)
Eosinophils Relative: 10 %
HCT: 30.9 % — ABNORMAL LOW (ref 36.0–46.0)
Hemoglobin: 10.4 g/dL — ABNORMAL LOW (ref 12.0–15.0)
Immature Granulocytes: 0 %
Lymphocytes Relative: 13 %
Lymphs Abs: 0.7 10*3/uL (ref 0.7–4.0)
MCH: 29.3 pg (ref 26.0–34.0)
MCHC: 33.7 g/dL (ref 30.0–36.0)
MCV: 87 fL (ref 80.0–100.0)
Monocytes Absolute: 0.5 10*3/uL (ref 0.1–1.0)
Monocytes Relative: 10 %
Neutro Abs: 3.5 10*3/uL (ref 1.7–7.7)
Neutrophils Relative %: 66 %
Platelets: 98 10*3/uL — ABNORMAL LOW (ref 150–400)
RBC: 3.55 MIL/uL — ABNORMAL LOW (ref 3.87–5.11)
RDW: 13.9 % (ref 11.5–15.5)
WBC: 5.3 10*3/uL (ref 4.0–10.5)
nRBC: 0 % (ref 0.0–0.2)

## 2020-03-27 LAB — COMPREHENSIVE METABOLIC PANEL
ALT: 28 U/L (ref 0–44)
AST: 26 U/L (ref 15–41)
Albumin: 3.9 g/dL (ref 3.5–5.0)
Alkaline Phosphatase: 70 U/L (ref 38–126)
Anion gap: 10 (ref 5–15)
BUN: 17 mg/dL (ref 8–23)
CO2: 24 mmol/L (ref 22–32)
Calcium: 8.7 mg/dL — ABNORMAL LOW (ref 8.9–10.3)
Chloride: 105 mmol/L (ref 98–111)
Creatinine, Ser: 1.07 mg/dL — ABNORMAL HIGH (ref 0.44–1.00)
GFR calc Af Amer: >60 mL/min (ref >60)
GFR calc non Af Amer: 55 mL/min — ABNORMAL LOW (ref >60)
Glucose, Bld: 137 mg/dL — ABNORMAL HIGH (ref 70–99)
Potassium: 4.1 mmol/L (ref 3.5–5.1)
Sodium: 139 mmol/L (ref 135–145)
Total Bilirubin: 0.3 mg/dL (ref 0.3–1.2)
Total Protein: 7.1 g/dL (ref 6.5–8.1)

## 2020-03-27 LAB — MAGNESIUM: Magnesium: 1.2 mg/dL — ABNORMAL LOW (ref 1.7–2.4)

## 2020-03-27 MED ORDER — DARATUMUMAB-HYALURONIDASE-FIHJ 1800-30000 MG-UT/15ML ~~LOC~~ SOLN
1800.0000 mg | Freq: Once | SUBCUTANEOUS | Status: AC
  Administered 2020-03-27: 1800 mg via SUBCUTANEOUS
  Filled 2020-03-27: qty 15

## 2020-03-27 MED ORDER — PROMETHAZINE HCL 25 MG/ML IJ SOLN
25.0000 mg | Freq: Once | INTRAMUSCULAR | Status: AC
  Administered 2020-03-27: 25 mg via INTRAVENOUS
  Filled 2020-03-27: qty 1

## 2020-03-27 MED ORDER — SODIUM CHLORIDE 0.9 % IV SOLN
Freq: Once | INTRAVENOUS | Status: AC
  Filled 2020-03-27: qty 250

## 2020-03-27 MED ORDER — MAGNESIUM SULFATE 2 GM/50ML IV SOLN
2.0000 g | Freq: Once | INTRAVENOUS | Status: AC
  Administered 2020-03-27: 2 g via INTRAVENOUS
  Filled 2020-03-27: qty 50

## 2020-03-27 MED ORDER — MAGNESIUM SULFATE 4 GM/100ML IV SOLN
4.0000 g | Freq: Once | INTRAVENOUS | Status: AC
  Administered 2020-03-27: 4 g via INTRAVENOUS
  Filled 2020-03-27: qty 100

## 2020-03-27 MED ORDER — SODIUM CHLORIDE 0.9 % IV SOLN: 6.0000 g | Freq: Once | INTRAVENOUS | Status: DC

## 2020-03-27 MED ORDER — HEPARIN SOD (PORK) LOCK FLUSH 100 UNIT/ML IV SOLN
500.0000 [IU] | Freq: Once | INTRAVENOUS | Status: AC | PRN
  Administered 2020-03-27: 500 [IU]
  Filled 2020-03-27: qty 5

## 2020-03-27 MED ORDER — DEXAMETHASONE 4 MG PO TABS
20.0000 mg | ORAL_TABLET | Freq: Once | ORAL | Status: AC
  Administered 2020-03-27: 20 mg via ORAL
  Filled 2020-03-27: qty 5

## 2020-03-27 NOTE — Progress Notes (Signed)
Plts 98, ok to treat per MD

## 2020-03-27 NOTE — Progress Notes (Unsigned)
Pt takes premeds at home as listed below: Tylenol 650mg  prior to arrival Benadryl 50mg  prior to arrival  See [facility] for decadron and phenergan dose

## 2020-03-27 NOTE — Progress Notes (Signed)
No new changes noted today 

## 2020-03-29 ENCOUNTER — Ambulatory Visit: Payer: Medicare PPO | Admitting: Physical Therapy

## 2020-03-29 ENCOUNTER — Other Ambulatory Visit: Payer: Self-pay

## 2020-03-29 ENCOUNTER — Encounter: Payer: Self-pay | Admitting: Physical Therapy

## 2020-03-29 DIAGNOSIS — R200 Anesthesia of skin: Secondary | ICD-10-CM

## 2020-03-29 DIAGNOSIS — R202 Paresthesia of skin: Secondary | ICD-10-CM | POA: Diagnosis not present

## 2020-03-29 DIAGNOSIS — G894 Chronic pain syndrome: Secondary | ICD-10-CM | POA: Diagnosis not present

## 2020-03-29 DIAGNOSIS — M6281 Muscle weakness (generalized): Secondary | ICD-10-CM | POA: Diagnosis not present

## 2020-03-29 NOTE — Therapy (Signed)
[facility] [address] [place], [place], [address] Phone: [phone]   Fax:  [phone]  Physical Therapy Treatment  Patient Details  Name: Sarah [name] MRN: [id number] Date of Birth: [birth year] Referring Provider (PT): Nolon Stalls, MD   Encounter Date: 03/29/2020   PT End of Session - 03/29/20 0732    Visit Number 6    Number of Visits 9    Date for PT Re-Evaluation 04/04/20    Authorization - Visit Number 6    Authorization - Number of Visits 10    PT Start Time 0729    PT Stop Time 0817    PT Time Calculation (min) 48 min    Activity Tolerance Patient tolerated treatment well    Behavior During Therapy [facility] for tasks assessed/performed           Past Medical History:  Diagnosis Date  . Abnormal stress test 02/14/2016   Overview:  Added automatically from request for surgery [id number]  . Anemia   . Anxiety   . Arthritis   . Bicuspid aortic valve   . CHF (congestive heart failure) ([place])   . CKD (chronic kidney disease) stage 3, GFR 30-59 ml/min   . Depression   . Diabetes mellitus ([place])   . Dizziness   . Fatty liver   . Frequent falls   . GERD (gastroesophageal reflux disease)   . Gout   . Heart murmur   . History of blood transfusion   . History of bone marrow transplant ([place])   . History of uterine fibroid   . Hx of cardiac catheterization 06/05/2016   Overview:  Normal coronaries 2017  . Hypertension   . Hypomagnesemia   . Multiple myeloma ([place])   . Personal history of chemotherapy   . Renal cyst     Past Surgical History:  Procedure Laterality Date  . ABDOMINAL HYSTERECTOMY    . 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 CARDIAC ELECTROPHYSIOLOGY MAPPING AND ABLATION    . CARPAL TUNNEL RELEASE Bilateral   . CHOLECYSTECTOMY  2008  . COLONOSCOPY WITH PROPOFOL N/A 05/07/2017   Procedure: COLONOSCOPY WITH PROPOFOL;  Surgeon: [name], MD;  Location: [facility] ENDOSCOPY;  Service: Gastroenterology;   Laterality: N/A;  . ESOPHAGOGASTRODUODENOSCOPY (EGD) WITH PROPOFOL N/A 05/07/2017   Procedure: ESOPHAGOGASTRODUODENOSCOPY (EGD) WITH PROPOFOL;  Surgeon: [name], MD;  Location: [facility] ENDOSCOPY;  Service: Gastroenterology;  Laterality: N/A;  . FOOT SURGERY Bilateral   . INCONTINENCE SURGERY  2009  . INTERSTIM IMPLANT PLACEMENT    . other     over active bladder  . OTHER SURGICAL HISTORY     bladder stimulator   . PARTIAL HYSTERECTOMY  03/1996   fibroids  . PORTA CATH INSERTION N/A 03/10/2019   Procedure: PORTA CATH INSERTION;  Surgeon: Algernon Huxley, MD;  Location: [place] CV LAB;  Service: Cardiovascular;  Laterality: N/A;  . TONSILLECTOMY  2007    There were no vitals filed for this visit.   Subjective Assessment - 03/29/20 0730    Subjective Pt. states she is seeing MD next Tuesday to see if she will receive a bone marrow transplant. Pt. reports she is no longer using a fentanyl patch.    Pertinent History pt has multiple myeloma, diagnosed in 2016. has bladder stimulator.    Patient Stated Goals Increase strength    Currently in Pain? Yes    Pain Score 5     Pain Location Back    Pain  Orientation Lower    Pain Onset More than a month ago             There Ex:  Nustep L2 10 mins: instructed to maintain SPM above 70 SPM  5XSTS practice: able to complete in 12.6s   STS with 5lb weight: 2x10. Yellow theraband around knees for form   Seated biceps curls: 3lb weights. 15x each arm   Supine chest press: dowell with 3lb ankle weight attached. 2x10. Verbal and tactile ueing for proper form   Supine SLR with 3lb ankle weights: 2x10   Manual:   15 mins STM to bilat cervical paraspinals: pt initially at 5/10 pain, after STM, pt reduced to 4/10. Paraspinals felt taught and few trigger points noted.        PT Long Term Goals - 03/15/20 0742      PT LONG TERM GOAL #1   Title Pt. will be able to walk for 10 minutes without pain increasing above 3/10 in low back to  improve functional mobility.    Baseline initial: pt can only walk 3-5 mins before onset of pain 6-7/10.; 9/15: pt able to walk approx 1:40 before onset of back pain 3/10    Time 8    Period Weeks    Status Partially Met    Target Date 04/04/20      PT LONG TERM GOAL #2   Title Pt will improve 5XSTS to 15 seconds or less to improve overall strength and functional mobility.    Baseline initial: 25.57s;. 9/15: 16.34 sec    Time 8    Period Weeks    Status Partially Met    Target Date 04/04/20      PT LONG TERM GOAL #3   Title Pt will improve global strength to at least 4/5 to improve functional mobility.    Baseline R/L 4/4 Traps, 4-/4- Shoulder abduction, 3/4 Shoulder ER, 3/4 Shoulder IR, 4/4+ Biceps, 3+/4 Triceps, 3+/3+ Hip flexion, 4-/4- Hip abduction, 5/5 Hip adduction (seated), 5/5 Knee extension, 4-/4- Knee flexion, 4/4 Ankle dorsiflexion; 9/15: R/L. 5/5 traps, 3+/3+ shoulder abd, 3+/3+ shoulder flex 3+/3+ shoulder ER, 3+/3+ shoulder IR, 4/4 Biceps, 3+/3+ Triceps, 3+/3+ Hip flexion, 4/4 Hip abd, 4+/4+ Hip add, 4+/5 knee ext, 4/4 knee flex, 4/4+ ankle dorsiflexion    Time 8    Period Weeks    Status Partially Met    Target Date 04/04/20                 Plan - 03/29/20 0827    Clinical Impression Statement Pt. returns to therapy with improved energy levels. Pt. still experiencing neck pain, although decreased from last session to 5/10 at start of session. Pt. completed various LE and UE strengthening exercises today, no increase in pain noted from exercise. Pt. responded well to STM on bilat cervical paraspinals, reported pain at 4/10 at end of session. Pt. mentioned that she will be seeing MD to discuss likelihood of bone marrow transplant next Tuesday. Pt. will continue to benefit from skilled PT to improve strength and endurance to improve pain free functional mobility.    Personal Factors and Comorbidities Comorbidity 2;Comorbidity 1    Comorbidities cancer     Stability/Clinical Decision Making Evolving/Moderate complexity    Clinical Decision Making Moderate    Rehab Potential Good    PT Frequency 1x / week    PT Duration 8 weeks    PT Treatment/Interventions ADLs/Self Care Home Management;Cryotherapy;Neuromuscular re-education;Balance training;Therapeutic exercise;Therapeutic activities;Functional mobility training;Gait training;Patient/family  education;Manual techniques    PT Next Visit Plan --    PT Home Exercise Plan [id number]    Consulted and Agree with Plan of Care Patient           Patient will benefit from skilled therapeutic intervention in order to improve the following deficits and impairments:  Abnormal gait, Decreased activity tolerance, Decreased balance, Decreased mobility, Decreased strength, Impaired sensation, Postural dysfunction, Difficulty walking  Visit Diagnosis: Muscle weakness (generalized)  Chronic pain syndrome  Numbness and tingling     Problem List Patient Active Problem List   Diagnosis Date Noted  . Yeast vaginitis 03/18/2020  . Hypotension 03/18/2020  . Hypokalemia 03/11/2020  . Malnutrition of moderate degree 03/03/2020  . Acute lower UTI 03/03/2020  . Antineoplastic chemotherapy induced pancytopenia ([place]) 03/03/2020  . Dyspnea 03/02/2020  . Physical deconditioning 02/07/2020  . Impaired nutrition 02/07/2020  . 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 Acute renal insufficiency 01/24/2020  . Sepsis ([place]) 11/13/2019  . Pneumonia of right lung due to infectious organism 11/13/2019  . Constipation 10/24/2019  . AKI (acute kidney injury) ([place]) 10/15/2019  . Neutropenic fever ([place]) 10/15/2019  . Febrile neutropenia ([place]) 10/15/2019  . Encounter for antineoplastic immunotherapy 10/03/2019  . Hypocalcemia 08/28/2019  . Stage 3 chronic kidney disease 05/12/2019  . Chronic nausea 12/17/2018  . Chemotherapy-induced neuropathy ([place]) 12/17/2018  . Hyperlipidemia associated with type 2 diabetes  mellitus ([place]) 11/25/2018  . Normocytic anemia 10/21/2018  . Hypomagnesemia 08/20/2018  . Goals of care, counseling/discussion 08/20/2018  . B12 deficiency 08/20/2018  . Thrombocytopenia ([place]) 02/09/2018  . Benign essential HTN 08/21/2017  . Cardiomyopathy, idiopathic ([place]) 08/21/2017  . Carotid artery stenosis, asymptomatic, bilateral 08/06/2017  . Thyroid nodule 08/06/2017  . Cardiac syncope 07/25/2017  . Iron deficiency anemia due to chronic blood loss 05/13/2017  . Spinal stenosis of lumbar region with neurogenic claudication 04/09/2017  . 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 LBBB (left bundle branch block) 10/10/2016  . Long term prescription opiate use 09/07/2016  . Chronic pain syndrome 07/08/2016  . Pain medication agreement signed 07/08/2016  . Spondylosis of lumbar region without myelopathy or radiculopathy 07/08/2016  . Chronic diastolic CHF (congestive heart failure), NYHA class 2 ([place]) 06/05/2016  . LVH (left ventricular hypertrophy) due to hypertensive disease, with heart failure ([place]) 06/05/2016  . Mild aortic valve stenosis 06/05/2016  . 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[place]) 06/05/2016  . Environmental and seasonal allergies 04/19/2016  . Insomnia 04/19/2016  . Bicuspid aortic valve 04/19/2016  . Asthma 04/19/2016  . Aortic regurgitation 04/19/2016  . Type II diabetes mellitus with complication ([place]) 79/48/0165  . Depression, major, single episode, in partial remission ([place]) 04/12/2016  . Irritable bowel syndrome (IBS) 04/12/2016  . Hepatic steatosis 04/12/2016  . Multiple myeloma not having achieved remission ([place]) 04/02/2016  . 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[place]) 07/06/2015  . Stem cell transplant candidate 05/16/2015   Pura Spice, PT, DPT # [id number], SPT 03/29/2020, 11:21 AM  [facility] [address] [place], [place], [address] Phone: [phone]   Fax:   [phone]  Name: Sarah [name] MRN: [id number] Date of Birth: [birth year]

## 2020-03-30 ENCOUNTER — Inpatient Hospital Stay: Payer: Medicare PPO

## 2020-03-30 VITALS — BP 101/64 | HR 74 | Resp 18

## 2020-03-30 DIAGNOSIS — Z1152 Encounter for screening for COVID-19: Secondary | ICD-10-CM | POA: Diagnosis not present

## 2020-03-30 DIAGNOSIS — D649 Anemia, unspecified: Secondary | ICD-10-CM | POA: Diagnosis not present

## 2020-03-30 DIAGNOSIS — D709 Neutropenia, unspecified: Secondary | ICD-10-CM | POA: Diagnosis not present

## 2020-03-30 DIAGNOSIS — C9000 Multiple myeloma not having achieved remission: Secondary | ICD-10-CM

## 2020-03-30 DIAGNOSIS — R809 Proteinuria, unspecified: Secondary | ICD-10-CM | POA: Diagnosis not present

## 2020-03-30 DIAGNOSIS — R5081 Fever presenting with conditions classified elsewhere: Secondary | ICD-10-CM | POA: Diagnosis not present

## 2020-03-30 DIAGNOSIS — Z5112 Encounter for antineoplastic immunotherapy: Secondary | ICD-10-CM | POA: Diagnosis not present

## 2020-03-30 DIAGNOSIS — T50995A Adverse effect of other drugs, medicaments and biological substances, initial encounter: Secondary | ICD-10-CM | POA: Diagnosis not present

## 2020-03-30 DIAGNOSIS — M87180 Osteonecrosis due to drugs, jaw: Secondary | ICD-10-CM | POA: Diagnosis not present

## 2020-03-30 LAB — MAGNESIUM: Magnesium: 1.1 mg/dL — ABNORMAL LOW (ref 1.7–2.4)

## 2020-03-30 MED ORDER — PROMETHAZINE HCL 25 MG/ML IJ SOLN
INTRAMUSCULAR | Status: AC
  Filled 2020-03-30: qty 1

## 2020-03-30 MED ORDER — PROMETHAZINE HCL 25 MG/ML IJ SOLN
25.0000 mg | Freq: Once | INTRAMUSCULAR | Status: AC
  Administered 2020-03-30: 25 mg via INTRAVENOUS

## 2020-03-30 MED ORDER — SODIUM CHLORIDE 0.9 % IV SOLN
INTRAVENOUS | Status: DC
  Filled 2020-03-30: qty 250

## 2020-03-30 MED ORDER — HEPARIN SOD (PORK) LOCK FLUSH 100 UNIT/ML IV SOLN
500.0000 [IU] | Freq: Once | INTRAVENOUS | Status: AC
  Administered 2020-03-30: 500 [IU] via INTRAVENOUS
  Filled 2020-03-30: qty 5

## 2020-03-30 MED ORDER — SODIUM CHLORIDE 0.9 % IV SOLN: 6.0000 g | Freq: Once | INTRAVENOUS | Status: DC

## 2020-03-30 MED ORDER — MAGNESIUM SULFATE 4 GM/100ML IV SOLN
4.0000 g | Freq: Once | INTRAVENOUS | Status: AC
  Administered 2020-03-30: 4 g via INTRAVENOUS
  Filled 2020-03-30: qty 100

## 2020-03-30 MED ORDER — MAGNESIUM SULFATE 2 GM/50ML IV SOLN
2.0000 g | Freq: Once | INTRAVENOUS | Status: AC
  Administered 2020-03-30: 2 g via INTRAVENOUS
  Filled 2020-03-30: qty 50

## 2020-04-03 ENCOUNTER — Other Ambulatory Visit: Payer: Self-pay

## 2020-04-03 ENCOUNTER — Inpatient Hospital Stay: Payer: Medicare PPO | Attending: Hematology and Oncology

## 2020-04-03 ENCOUNTER — Ambulatory Visit (INDEPENDENT_AMBULATORY_CARE_PROVIDER_SITE_OTHER): Payer: Medicare PPO | Admitting: Internal Medicine

## 2020-04-03 ENCOUNTER — Encounter: Payer: Self-pay | Admitting: Internal Medicine

## 2020-04-03 ENCOUNTER — Inpatient Hospital Stay: Payer: Medicare PPO

## 2020-04-03 VITALS — BP 136/70 | HR 86 | Ht 62.5 in | Wt 144.0 lb

## 2020-04-03 VITALS — Temp 96.7°F

## 2020-04-03 DIAGNOSIS — Z803 Family history of malignant neoplasm of breast: Secondary | ICD-10-CM | POA: Diagnosis not present

## 2020-04-03 DIAGNOSIS — Z8379 Family history of other diseases of the digestive system: Secondary | ICD-10-CM | POA: Diagnosis not present

## 2020-04-03 DIAGNOSIS — Z79899 Other long term (current) drug therapy: Secondary | ICD-10-CM | POA: Diagnosis not present

## 2020-04-03 DIAGNOSIS — Z833 Family history of diabetes mellitus: Secondary | ICD-10-CM | POA: Diagnosis not present

## 2020-04-03 DIAGNOSIS — Z822 Family history of deafness and hearing loss: Secondary | ICD-10-CM | POA: Insufficient documentation

## 2020-04-03 DIAGNOSIS — R52 Pain, unspecified: Secondary | ICD-10-CM | POA: Insufficient documentation

## 2020-04-03 DIAGNOSIS — Z808 Family history of malignant neoplasm of other organs or systems: Secondary | ICD-10-CM | POA: Insufficient documentation

## 2020-04-03 DIAGNOSIS — C9000 Multiple myeloma not having achieved remission: Secondary | ICD-10-CM | POA: Insufficient documentation

## 2020-04-03 DIAGNOSIS — M47816 Spondylosis without myelopathy or radiculopathy, lumbar region: Secondary | ICD-10-CM

## 2020-04-03 DIAGNOSIS — Z23 Encounter for immunization: Secondary | ICD-10-CM | POA: Diagnosis not present

## 2020-04-03 DIAGNOSIS — E118 Type 2 diabetes mellitus with unspecified complications: Secondary | ICD-10-CM

## 2020-04-03 DIAGNOSIS — D801 Nonfamilial hypogammaglobulinemia: Secondary | ICD-10-CM | POA: Diagnosis not present

## 2020-04-03 DIAGNOSIS — Z8249 Family history of ischemic heart disease and other diseases of the circulatory system: Secondary | ICD-10-CM | POA: Insufficient documentation

## 2020-04-03 DIAGNOSIS — R110 Nausea: Secondary | ICD-10-CM | POA: Diagnosis not present

## 2020-04-03 DIAGNOSIS — D649 Anemia, unspecified: Secondary | ICD-10-CM | POA: Insufficient documentation

## 2020-04-03 DIAGNOSIS — M48062 Spinal stenosis, lumbar region with neurogenic claudication: Secondary | ICD-10-CM

## 2020-04-03 DIAGNOSIS — Z841 Family history of disorders of kidney and ureter: Secondary | ICD-10-CM | POA: Diagnosis not present

## 2020-04-03 DIAGNOSIS — G629 Polyneuropathy, unspecified: Secondary | ICD-10-CM | POA: Diagnosis not present

## 2020-04-03 DIAGNOSIS — Z832 Family history of diseases of the blood and blood-forming organs and certain disorders involving the immune mechanism: Secondary | ICD-10-CM | POA: Diagnosis not present

## 2020-04-03 DIAGNOSIS — N183 Chronic kidney disease, stage 3 unspecified: Secondary | ICD-10-CM | POA: Diagnosis not present

## 2020-04-03 DIAGNOSIS — E119 Type 2 diabetes mellitus without complications: Secondary | ICD-10-CM | POA: Insufficient documentation

## 2020-04-03 DIAGNOSIS — Z800 Family history of malignant neoplasm of digestive organs: Secondary | ICD-10-CM | POA: Diagnosis not present

## 2020-04-03 DIAGNOSIS — F324 Major depressive disorder, single episode, in partial remission: Secondary | ICD-10-CM | POA: Insufficient documentation

## 2020-04-03 DIAGNOSIS — M549 Dorsalgia, unspecified: Secondary | ICD-10-CM | POA: Diagnosis not present

## 2020-04-03 DIAGNOSIS — I10 Essential (primary) hypertension: Secondary | ICD-10-CM

## 2020-04-03 DIAGNOSIS — I429 Cardiomyopathy, unspecified: Secondary | ICD-10-CM

## 2020-04-03 DIAGNOSIS — I428 Other cardiomyopathies: Secondary | ICD-10-CM | POA: Diagnosis not present

## 2020-04-03 DIAGNOSIS — Z801 Family history of malignant neoplasm of trachea, bronchus and lung: Secondary | ICD-10-CM | POA: Diagnosis not present

## 2020-04-03 LAB — CBC WITH DIFFERENTIAL/PLATELET
Abs Immature Granulocytes: 0.01 10*3/uL (ref 0.00–0.07)
Basophils Absolute: 0 10*3/uL (ref 0.0–0.1)
Basophils Relative: 1 %
Eosinophils Absolute: 0.1 10*3/uL (ref 0.0–0.5)
Eosinophils Relative: 4 %
HCT: 30.9 % — ABNORMAL LOW (ref 36.0–46.0)
Hemoglobin: 10.4 g/dL — ABNORMAL LOW (ref 12.0–15.0)
Immature Granulocytes: 0 %
Lymphocytes Relative: 30 %
Lymphs Abs: 0.9 10*3/uL (ref 0.7–4.0)
MCH: 29.5 pg (ref 26.0–34.0)
MCHC: 33.7 g/dL (ref 30.0–36.0)
MCV: 87.8 fL (ref 80.0–100.0)
Monocytes Absolute: 0.6 10*3/uL (ref 0.1–1.0)
Monocytes Relative: 22 %
Neutro Abs: 1.2 10*3/uL — ABNORMAL LOW (ref 1.7–7.7)
Neutrophils Relative %: 43 %
Platelets: 144 10*3/uL — ABNORMAL LOW (ref 150–400)
RBC: 3.52 MIL/uL — ABNORMAL LOW (ref 3.87–5.11)
RDW: 14.6 % (ref 11.5–15.5)
WBC: 2.8 10*3/uL — ABNORMAL LOW (ref 4.0–10.5)
nRBC: 0 % (ref 0.0–0.2)

## 2020-04-03 LAB — MAGNESIUM: Magnesium: 1.3 mg/dL — ABNORMAL LOW (ref 1.7–2.4)

## 2020-04-03 MED ORDER — MAGNESIUM SULFATE 2 GM/50ML IV SOLN
2.0000 g | Freq: Once | INTRAVENOUS | Status: AC
  Administered 2020-04-03: 2 g via INTRAVENOUS
  Filled 2020-04-03: qty 50

## 2020-04-03 MED ORDER — SODIUM CHLORIDE 0.9 % IV SOLN
Freq: Once | INTRAVENOUS | Status: AC
  Filled 2020-04-03: qty 250

## 2020-04-03 MED ORDER — METHOCARBAMOL 500 MG PO TABS: 500.0000 mg | ORAL_TABLET | Freq: Four times a day (QID) | ORAL | 0 refills | Status: DC

## 2020-04-03 MED ORDER — SODIUM CHLORIDE 0.9 % IV SOLN: 6.0000 g | Freq: Once | INTRAVENOUS | Status: DC

## 2020-04-03 MED ORDER — SODIUM CHLORIDE 0.9% FLUSH
10.0000 mL | Freq: Once | INTRAVENOUS | Status: AC | PRN
  Administered 2020-04-03: 10 mL
  Filled 2020-04-03: qty 10

## 2020-04-03 MED ORDER — PROMETHAZINE HCL 25 MG/ML IJ SOLN
25.0000 mg | Freq: Once | INTRAMUSCULAR | Status: AC
  Administered 2020-04-03: 25 mg via INTRAVENOUS
  Filled 2020-04-03: qty 1

## 2020-04-03 MED ORDER — MAGNESIUM SULFATE 4 GM/100ML IV SOLN
4.0000 g | Freq: Once | INTRAVENOUS | Status: AC
  Administered 2020-04-03: 4 g via INTRAVENOUS
  Filled 2020-04-03: qty 100

## 2020-04-03 MED ORDER — HEPARIN SOD (PORK) LOCK FLUSH 100 UNIT/ML IV SOLN
500.0000 [IU] | Freq: Once | INTRAVENOUS | Status: DC | PRN
  Filled 2020-04-03: qty 5

## 2020-04-03 NOTE — Progress Notes (Signed)
Date:  04/03/2020   Name:  Sarah [name]   DOB:  1955-10-14   MRN:  [id number]   Chief Complaint: Hypertension (BP follow up. ), Back Pain (Started Thursday- Pain at 7. Back Pain and Sciatica. Said she stopped the pain patches for 3 weeks. Wants to know if she can robaxin for her back. ), Flu Vaccine (Reg dose.), and Referral (Wants referral to pain management clinic closer to home. She said hers has moved [place] the country at [facility] and its too far for her to drive back and forth to.)  Hypertension This is a chronic problem. The problem is controlled. Associated symptoms include palpitations. Pertinent negatives include no chest pain, headaches or shortness of breath. Past treatments include angiotensin blockers (losartan stopped). Hypertensive end-organ damage includes kidney disease.  Back Pain This is a chronic problem. The pain is present in the lumbar spine. The pain radiates to the right foot and left foot. The pain is at a severity of 3/10. Associated symptoms include numbness. Pertinent negatives include no chest pain, fever or headaches. She has tried muscle relaxant and analgesics (ablation in cervical and lumbar spine; also ESI) for the symptoms. The treatment provided moderate (stopped Fentanyl patches) relief.  Multiple myeloma - being evaluated for another bone marrow transplant tomorrow. CM - stable cardiac symptoms Lab Results  Component Value Date   CREATININE 1.07 (H) 03/27/2020   BUN 17 03/27/2020   NA 139 03/27/2020   K 4.1 03/27/2020   CL 105 03/27/2020   CO2 24 03/27/2020   Lab Results  Component Value Date   CHOL 186 11/30/2019   HDL 71 11/30/2019   LDLCALC 95 11/30/2019   TRIG 114 11/30/2019   CHOLHDL 2.6 11/30/2019   Lab Results  Component Value Date   TSH 2.105 08/20/2018   Lab Results  Component Value Date   HGBA1C 5.9 (H) 03/01/2020   Lab Results  Component Value Date   WBC 2.8 (L) 04/03/2020   HGB 10.4 (L) 04/03/2020   HCT 30.9 (L)  04/03/2020   MCV 87.8 04/03/2020   PLT 144 (L) 04/03/2020   Lab Results  Component Value Date   ALT 28 03/27/2020   AST 26 03/27/2020   ALKPHOS 70 03/27/2020   BILITOT 0.3 03/27/2020     Review of Systems  Constitutional: Positive for fatigue. Negative for diaphoresis, fever and unexpected weight change.  Respiratory: Negative for cough, chest tightness, shortness of breath and wheezing.   Cardiovascular: Positive for palpitations. Negative for chest pain and leg swelling.  Gastrointestinal: Positive for diarrhea.  Musculoskeletal: Positive for back pain and gait problem.  Neurological: Positive for numbness. Negative for dizziness and headaches.  Psychiatric/Behavioral: Positive for dysphoric mood. Negative for sleep disturbance. The patient is nervous/anxious.     Patient Active Problem List   Diagnosis Date Noted  . Yeast vaginitis 03/18/2020  . Hypotension 03/18/2020  . Hypokalemia 03/11/2020  . Malnutrition of moderate degree 03/03/2020  . Acute lower UTI 03/03/2020  . Antineoplastic chemotherapy induced pancytopenia ([place]) 03/03/2020  . Dyspnea 03/02/2020  . Physical deconditioning 02/07/2020  . Impaired nutrition 02/07/2020  . 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 Acute renal insufficiency 01/24/2020  . Sepsis ([place]) 11/13/2019  . Pneumonia of right lung due to infectious organism 11/13/2019  . Constipation 10/24/2019  . AKI (acute kidney injury) ([place]) 10/15/2019  . Neutropenic fever ([place]) 10/15/2019  . Febrile neutropenia ([place]) 10/15/2019  . Encounter for antineoplastic immunotherapy 10/03/2019  . Hypocalcemia 08/28/2019  .  Stage 3 chronic kidney disease ([place]) 05/12/2019  . Chronic nausea 12/17/2018  . Chemotherapy-induced neuropathy ([place]) 12/17/2018  . Hyperlipidemia associated with type 2 diabetes mellitus ([place]) 11/25/2018  . Normocytic anemia 10/21/2018  . Hypomagnesemia 08/20/2018  . Goals of care, counseling/discussion 08/20/2018  . B12 deficiency  08/20/2018  . Thrombocytopenia ([place]) 02/09/2018  . Benign essential HTN 08/21/2017  . Cardiomyopathy, idiopathic ([place]) 08/21/2017  . Carotid artery stenosis, asymptomatic, bilateral 08/06/2017  . Thyroid nodule 08/06/2017  . Cardiac syncope 07/25/2017  . Iron deficiency anemia due to chronic blood loss 05/13/2017  . Spinal stenosis of lumbar region with neurogenic claudication 04/09/2017  . 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 LBBB (left bundle branch block) 10/10/2016  . Long term prescription opiate use 09/07/2016  . Chronic pain syndrome 07/08/2016  . Pain medication agreement signed 07/08/2016  . Spondylosis of lumbar region without myelopathy or radiculopathy 07/08/2016  . Chronic diastolic CHF (congestive heart failure), NYHA class 2 ([place]) 06/05/2016  . LVH (left ventricular hypertrophy) due to hypertensive disease, with heart failure ([place]) 06/05/2016  . Mild aortic valve stenosis 06/05/2016  . 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[place]) 06/05/2016  . Environmental and seasonal allergies 04/19/2016  . Insomnia 04/19/2016  . Bicuspid aortic valve 04/19/2016  . Asthma 04/19/2016  . Aortic regurgitation 04/19/2016  . Type II diabetes mellitus with complication ([place]) 37/85/8850  . Depression, major, single episode, in partial remission ([place]) 04/12/2016  . Irritable bowel syndrome (IBS) 04/12/2016  . Hepatic steatosis 04/12/2016  . Multiple myeloma not having achieved remission ([place]) 04/02/2016  . 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[place]) 07/06/2015  . Stem cell transplant candidate 05/16/2015    Allergies  Allergen Reactions  . Oxycodone-Acetaminophen Anaphylaxis    Swelling and rash  . Celebrex [Celecoxib] Diarrhea  . Codeine   . Plerixafor     In 2016 during ASCT collection patient developed fever to 103.33F and required hospitalization  . Benadryl [Diphenhydramine] Palpitations  . Morphine Itching and Rash  . Ondansetron Diarrhea  . Tylenol  [Acetaminophen] Itching and Rash    Past Surgical History:  Procedure Laterality Date  . ABDOMINAL HYSTERECTOMY    . 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 CARDIAC ELECTROPHYSIOLOGY MAPPING AND ABLATION    . CARPAL TUNNEL RELEASE Bilateral   . CHOLECYSTECTOMY  2008  . COLONOSCOPY WITH PROPOFOL N/A 05/07/2017   Procedure: COLONOSCOPY WITH PROPOFOL;  Surgeon: [name], MD;  Location: [facility] ENDOSCOPY;  Service: Gastroenterology;  Laterality: N/A;  . ESOPHAGOGASTRODUODENOSCOPY (EGD) WITH PROPOFOL N/A 05/07/2017   Procedure: ESOPHAGOGASTRODUODENOSCOPY (EGD) WITH PROPOFOL;  Surgeon: [name], MD;  Location: [facility] ENDOSCOPY;  Service: Gastroenterology;  Laterality: N/A;  . FOOT SURGERY Bilateral   . INCONTINENCE SURGERY  2009  . INTERSTIM IMPLANT PLACEMENT    . other     over active bladder  . OTHER SURGICAL HISTORY     bladder stimulator   . PARTIAL HYSTERECTOMY  03/1996   fibroids  . PORTA CATH INSERTION N/A 03/10/2019   Procedure: PORTA CATH INSERTION;  Surgeon: Algernon Huxley, MD;  Location: [place] CV LAB;  Service: Cardiovascular;  Laterality: N/A;  . TONSILLECTOMY  2007    Social History   Tobacco Use  . Smoking status: Former Smoker    Packs/day: 1.00    Years: 20.00    Pack years: 20.00    Types: Cigarettes    Quit date: 07/02/1991    Years since quitting: 28.7  . Smokeless tobacco: Never Used  Vaping  Use  . Vaping Use: Never used  Substance Use Topics  . Alcohol use: Yes    Comment: occasionally  . Drug use: No     Medication list has been reviewed and updated.  Current Meds  Medication Sig  . acetaminophen (TYLENOL) 500 MG tablet Take 500 mg by mouth every 6 (six) hours as needed.   Marland Kitchen acyclovir (ZOVIRAX) 400 MG tablet Take 1 tablet (400 mg total) by mouth 2 (two) times daily.  Marland Kitchen allopurinol (ZYLOPRIM) 100 MG tablet TAKE 1 TABLET(100 MG) BY MOUTH DAILY as needed  . aspirin 81 MG chewable tablet Chew 1 tablet (81 mg total) by mouth daily.  Marland Kitchen dexamethasone  (DECADRON) 4 MG tablet Take 5 tablets (62m) on days 2, 16 of cycle 3,4, 5 and 6 Take 5 tablets (247m on days 2 of cycles 7 and beyond  . diclofenac sodium (VOLTAREN) 1 % GEL Apply 2 g topically 4 (four) times daily.  . diphenhydrAMINE (BENADRYL) 50 MG capsule Take 50 mg by mouth as needed.   . diphenoxylate-atropine (LOMOTIL) 2.5-0.025 MG tablet Take 2 tablets by mouth daily as needed for diarrhea or loose stools.  . fexofenadine (ALLEGRA) 180 MG tablet TAKE 1 TABLET(180 MG) BY MOUTH DAILY (Patient taking differently: Prn)  . FLUoxetine (PROZAC) 40 MG capsule TAKE 1 CAPSULE EVERY DAY  . fluticasone (FLONASE) 50 MCG/ACT nasal spray Place 2 sprays into both nostrils daily.  . Marland Kitchenlucose blood (ONE TOUCH ULTRA TEST) test strip   . lactulose (CHRONULAC) 10 GM/15ML solution Take 20 g by mouth daily as needed.   . lidocaine-prilocaine (EMLA) cream APPLY TOPICALLY AS NEEDED.  . metFORMIN (GLUCOPHAGE-XR) 500 MG 24 hr tablet TAKE 1 TABLET (500 MG TOTAL) BY MOUTH DAILY WITH BREAKFAST.  . montelukast (SINGULAIR) 10 MG tablet Take 1 tablet by mouth on the day before treatment and for 2 days after treatment.  . Multiple Vitamin (MULTIVITAMIN) tablet Take 1 tablet by mouth daily.  . Multiple Vitamins-Minerals (HAIR/SKIN/NAILS) TABS Take 1 tablet by mouth daily.  . Marland Kitchenmeprazole (PRILOSEC) 40 MG capsule TAKE 1 CAPSULE EVERY DAY  . promethazine (PHENERGAN) 25 MG tablet Take 1 tablet (25 mg total) by mouth every 6 (six) hours as needed for nausea or vomiting.  . promethazine-dextromethorphan (PROMETHAZINE-DM) 6.25-15 MG/5ML syrup Take 5 mLs by mouth 4 (four) times daily as needed for cough.  . Marland KitcheniZANidine (ZANAFLEX) 4 MG tablet Take 1 tablet (4 mg total) by mouth every 8 (eight) hours as needed for muscle spasms.  . traZODone (DESYREL) 100 MG tablet TAKE 1 TABLET AT BEDTIME  FOR  SLEEP    PHQ 2/9 Scores 04/03/2020 02/07/2020 11/30/2019 10/27/2019  PHQ - 2 Score 2 1 0 2  PHQ- 9 Score 2 8 6 5     GAD 7 : Generalized  Anxiety Score 04/03/2020 11/30/2019 10/27/2019 01/04/2019  Nervous, Anxious, on Edge 3 3 3 2   Control/stop worrying 3 0 3 1  Worry too much - different things 3 0 3 2  Trouble relaxing 0 3 0 1  Restless 0 3 0 1  Easily annoyed or irritable 0 1 0 0  Afraid - awful might happen 0 0 3 0  Total GAD 7 Score 9 10 12 7   Anxiety Difficulty Not difficult at all Not difficult at all Not difficult at all Somewhat difficult    BP Readings from Last 3 Encounters:  04/03/20 136/70  03/30/20 101/64  03/27/20 131/61    Physical Exam Constitutional:      Appearance: Normal  appearance.  Cardiovascular:     Rate and Rhythm: Normal rate and regular rhythm.     Pulses: Normal pulses.  Pulmonary:     Effort: Pulmonary effort is normal.     Breath sounds: No wheezing or rhonchi.  Musculoskeletal:     Right lower leg: No edema.     Left lower leg: No edema.  Skin:    General: Skin is warm and dry.     Capillary Refill: Capillary refill takes less than 2 seconds.  Neurological:     General: No focal deficit present.     Mental Status: She is alert.     Gait: Gait abnormal.  Psychiatric:        Mood and Affect: Mood normal.     Wt Readings from Last 3 Encounters:  04/03/20 144 lb (65.3 kg)  03/27/20 143 lb 11.8 oz (65.2 kg)  03/16/20 140 lb (63.5 kg)    BP 136/70   Pulse 86   Ht 5' 2.5" (1.588 m)   Wt 144 lb (65.3 kg)   SpO2 98%   BMI 25.92 kg/m   Assessment and Plan: 1. Benign essential HTN Clinically stable exam with well controlled BP now off of medications Pt to continue current regimen and low sodium diet; benefits of regular exercise as able discussed.  2. Type II diabetes mellitus with complication ([place]) Clinically stable by exam and report without s/s of hypoglycemia. DM complicated by cardiomyopathy. Tolerating medications well without side effects or other concerns. She does have chronic nausea which is likely not related to metformin  3. Spondylosis of lumbar region  without myelopathy or radiculopathy She has now been off of Fentanyl patch for one month and doing fair Will try Robaxin qid She has some patches left to use if the pain becomes severe Will refer to another pain management clinic closer - methocarbamol (ROBAXIN) 500 MG tablet; Take 1 tablet (500 mg total) by mouth 4 (four) times daily.  Dispense: 120 tablet; Refill: 0 - Ambulatory referral to Pain Clinic  4. Spinal stenosis of lumbar region with neurogenic claudication - Ambulatory referral to Pain Clinic  5. Cardiomyopathy, idiopathic ([place]) Followed by Cardiology Dr. Mal Misty  6. Need for immunization against influenza - Flu Vaccine QUAD 36+ mos IM   Partially dictated using Editor, commissioning. Any errors are unintentional.  Halina Maidens, MD [facility]  04/03/2020

## 2020-04-04 ENCOUNTER — Inpatient Hospital Stay: Payer: Medicare PPO

## 2020-04-04 DIAGNOSIS — C9001 Multiple myeloma in remission: Secondary | ICD-10-CM | POA: Diagnosis not present

## 2020-04-05 ENCOUNTER — Encounter: Payer: Medicare PPO | Admitting: Physical Therapy

## 2020-04-06 ENCOUNTER — Inpatient Hospital Stay: Payer: Medicare PPO | Attending: Hematology and Oncology

## 2020-04-06 ENCOUNTER — Inpatient Hospital Stay: Payer: Medicare PPO

## 2020-04-06 ENCOUNTER — Other Ambulatory Visit: Payer: Self-pay

## 2020-04-06 VITALS — BP 134/80 | HR 106 | Temp 98.6°F | Resp 18

## 2020-04-06 DIAGNOSIS — D649 Anemia, unspecified: Secondary | ICD-10-CM | POA: Diagnosis not present

## 2020-04-06 DIAGNOSIS — C9000 Multiple myeloma not having achieved remission: Secondary | ICD-10-CM

## 2020-04-06 DIAGNOSIS — D801 Nonfamilial hypogammaglobulinemia: Secondary | ICD-10-CM | POA: Diagnosis not present

## 2020-04-06 DIAGNOSIS — G629 Polyneuropathy, unspecified: Secondary | ICD-10-CM | POA: Diagnosis not present

## 2020-04-06 DIAGNOSIS — R110 Nausea: Secondary | ICD-10-CM | POA: Diagnosis not present

## 2020-04-06 DIAGNOSIS — R52 Pain, unspecified: Secondary | ICD-10-CM | POA: Diagnosis not present

## 2020-04-06 DIAGNOSIS — I10 Essential (primary) hypertension: Secondary | ICD-10-CM | POA: Diagnosis not present

## 2020-04-06 DIAGNOSIS — E119 Type 2 diabetes mellitus without complications: Secondary | ICD-10-CM | POA: Diagnosis not present

## 2020-04-06 LAB — MAGNESIUM: Magnesium: 1.2 mg/dL — ABNORMAL LOW (ref 1.7–2.4)

## 2020-04-06 MED ORDER — MAGNESIUM SULFATE 2 GM/50ML IV SOLN
2.0000 g | Freq: Once | INTRAVENOUS | Status: AC
  Administered 2020-04-06: 2 g via INTRAVENOUS
  Filled 2020-04-06: qty 50

## 2020-04-06 MED ORDER — PROMETHAZINE HCL 25 MG/ML IJ SOLN
INTRAMUSCULAR | Status: AC
  Filled 2020-04-06: qty 1

## 2020-04-06 MED ORDER — SODIUM CHLORIDE 0.9% FLUSH
10.0000 mL | Freq: Once | INTRAVENOUS | Status: AC | PRN
  Administered 2020-04-06: 10 mL
  Filled 2020-04-06: qty 10

## 2020-04-06 MED ORDER — SODIUM CHLORIDE 0.9 % IV SOLN: 6.0000 g | Freq: Once | INTRAVENOUS | Status: DC

## 2020-04-06 MED ORDER — MAGNESIUM SULFATE 4 GM/100ML IV SOLN
4.0000 g | Freq: Once | INTRAVENOUS | Status: AC
  Administered 2020-04-06: 4 g via INTRAVENOUS
  Filled 2020-04-06: qty 100

## 2020-04-06 MED ORDER — SODIUM CHLORIDE 0.9 % IV SOLN
Freq: Once | INTRAVENOUS | Status: AC
  Filled 2020-04-06: qty 250

## 2020-04-06 MED ORDER — HEPARIN SOD (PORK) LOCK FLUSH 100 UNIT/ML IV SOLN
500.0000 [IU] | Freq: Once | INTRAVENOUS | Status: AC | PRN
  Administered 2020-04-06: 500 [IU]
  Filled 2020-04-06: qty 5

## 2020-04-06 MED ORDER — PROMETHAZINE HCL 25 MG/ML IJ SOLN
25.0000 mg | Freq: Once | INTRAMUSCULAR | Status: AC
  Administered 2020-04-06: 25 mg via INTRAVENOUS

## 2020-04-06 NOTE — Progress Notes (Signed)
[facility]  [address], [address] [place], [place] [address] Phone: [phone]  Fax: [phone]   Clinic Day:  04/10/2020   Referring physician: Glean Hess, MD  Chief Complaint: Sarah [name] is a 64 y.o. 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    HPI: The patient was last seen in the medical oncology clinic on 03/27/2020.  At that time, she was doing well.  She denied any concerns.  Exam was stable. Hematocrit was 30.9, hemoglobin 10.4, MCV 87.0, platelets 98,000, WBC 5,300. Creatinine was 1.07 (CrCl 55 ml/min). Calcium was 8.7. Magnesium was 1.2. She received day 15 of cycle #6 daratumumab and 6 gm IV Magnesium.  The patient saw Dr. Clydene Laming on 04/04/2020 for re-evaluation. She was independent in all of her activities of daily living; she was able to clean her house, take care of her dog, do the laundry, and work in the yard. She had successfully weaned herself off Fentanyl patches; pain was under good control. Energy level had improved. She had continued nausea, but her appetite was good; she was no longer losing weight. She was able to get up a flight of stairs. She continued to work with physical therapy.  From a functional perspective, she was ready to proceed with transplant.   She wants to have the procedure done before the end of the year.  Plan was for pretransplant evaluation and work-up. Transplant is scheduled for 05/08/2020.  Labs followed: 03/30/2020: Magnesium 1.1. 04/03/2020: Magnesium 1.3. Hematocrit 30.9, hemoglobin 10.4, platelets 144,000, WBC 2,800 (ANC 1,200). 04/06/2020: Magnesium 1.2.  She received 6 gm IV Magnesium on 03/30/2020, 04/03/2020, and 04/06/2020.  During the interim, she has been "feeling good". She is nauseous. She denies fever. She took herself off of her Fentanyl patch and  has been having bowel movements everyday since then. She takes Robaxin QID. Her diabetes is under control.  The patient states that she has to go to [facility] on 04/19/2020 for pre-transplant testing. She has an appointment with a new doctor at the pain clinic in [place] in early December.   Past Medical History:  Diagnosis Date  . Abnormal stress test 02/14/2016   Overview:  Added automatically from request for surgery [id number]  . Anemia   . Anxiety   . Arthritis   . Bicuspid aortic valve   . CHF (congestive heart failure) ([place])   . CKD (chronic kidney disease) stage 3, GFR 30-59 ml/min (HCC)   . Depression   . Diabetes mellitus ([place])   . Dizziness   . Fatty liver   . Frequent falls   . GERD (gastroesophageal reflux disease)   . Gout   . Heart murmur   . History of blood transfusion   . History of bone marrow transplant ([place])   . History of uterine fibroid   . Hx of cardiac catheterization 06/05/2016   Overview:  Normal coronaries 2017  . Hypertension   . Hypomagnesemia   . Multiple myeloma ([place])   . Personal history of chemotherapy   . Renal cyst     Past Surgical History:  Procedure Laterality Date  . ABDOMINAL HYSTERECTOMY    . 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 CARDIAC ELECTROPHYSIOLOGY MAPPING AND ABLATION    . CARPAL TUNNEL RELEASE Bilateral   . CHOLECYSTECTOMY  2008  . COLONOSCOPY WITH PROPOFOL N/A 05/07/2017  Procedure: COLONOSCOPY WITH PROPOFOL;  Surgeon: [name], MD;  Location[facility] ENDOSCOPY;  Service: Gastroenterology;  Laterality: N/A;  . ESOPHAGOGASTRODUODENOSCOPY (EGD) WITH PROPOFOL N/A 05/07/2017   Procedure: ESOPHAGOGASTRODUODENOSCOPY (EGD) WITH PROPOFOL;  Surgeon: [name], MD;  Location: [facility] ENDOSCOPY;  Service: Gastroenterology;  Laterality: N/A;  . FOOT SURGERY Bilateral   . INCONTINENCE SURGERY  2009  . INTERSTIM IMPLANT PLACEMENT    . other     over active bladder  . OTHER SURGICAL HISTORY     bladder stimulator   . PARTIAL HYSTERECTOMY   03/1996   fibroids  . PORTA CATH INSERTION N/A 03/10/2019   Procedure: PORTA CATH INSERTION;  Surgeon: Algernon Huxley, MD;  Location: [place] [facility] LAB;  Service: Cardiovascular;  Laterality: N/A;  . TONSILLECTOMY  2007    Family History  Problem Relation Age of Onset  . Colon cancer Father   . Renal Disease Father   . Diabetes Mellitus II Father   . Melanoma Paternal Grandmother   . Breast cancer Maternal Aunt 36  . Anemia Mother   . Heart disease Mother   . Heart failure Mother   . Renal Disease Mother   . Congestive Heart Failure Mother   . Heart disease Maternal Uncle   . Throat cancer Maternal Uncle   . Lung cancer Maternal Uncle   . Liver disease Maternal Uncle   . Heart failure Maternal Uncle   . Hearing loss Son 83       Suicide     Social History:  reports that she quit smoking about 28 years ago. Her smoking use included cigarettes. She has a 20.00 pack-year smoking history. She has never used smokeless tobacco. She reports current alcohol use. She reports that she does not use drugs. Patient has not had ETOH in several months. She is on disability. She notes exposure to perchloroethylene [facility]).She lives much of her adult life in [place]. She was married with 2 sons. Her husband passed away. Her 2 sons took their own lives (age 88 and 90). She worked in [employer] in [employer]. She went back to school and earned an [employer] and works for [employer] for several years.She lives[place] sistersin [place]. She has two dogs, Harley and [place]ston. The patient is alone today.   Allergies:  Allergies  Allergen Reactions  . Oxycodone-Acetaminophen Anaphylaxis    Swelling and rash  . Celebrex [Celecoxib] Diarrhea  . Codeine   . Plerixafor     In 2016 during ASCT collection patient developed fever to 103.56F and required hospitalization  . Benadryl [Diphenhydramine] Palpitations  . Morphine Itching and Rash  . Ondansetron Diarrhea  . Tylenol  [Acetaminophen] Itching and Rash    Current Medications: Current Outpatient Medications  Medication Sig Dispense Refill  . acetaminophen (TYLENOL) 500 MG tablet Take 500 mg by mouth every 6 (six) hours as needed.     Marland Kitchen acyclovir (ZOVIRAX) 400 MG tablet Take 1 tablet (400 mg total) by mouth 2 (two) times daily. 60 tablet 11  . allopurinol (ZYLOPRIM) 100 MG tablet TAKE 1 TABLET(100 MG) BY MOUTH DAILY as needed 90 tablet 1  . aspirin 81 MG chewable tablet Chew 1 tablet (81 mg total) by mouth daily. 30 tablet 0  . dexamethasone (DECADRON) 4 MG tablet Take 5 tablets (72m) on days 2, 16 of cycle 3,4, 5 and 6 Take 5 tablets (266m on days 2 of cycles 7 and beyond 50 tablet 0  . diclofenac sodium (VOLTAREN) 1 %  GEL Apply 2 g topically 4 (four) times daily. 100 g 1  . diphenhydrAMINE (BENADRYL) 50 MG capsule Take 50 mg by mouth as needed.     . diphenoxylate-atropine (LOMOTIL) 2.5-0.025 MG tablet Take 2 tablets by mouth daily as needed for diarrhea or loose stools. 45 tablet 2  . fexofenadine (ALLEGRA) 180 MG tablet TAKE 1 TABLET(180 MG) BY MOUTH DAILY (Patient taking differently: Prn) 30 tablet 5  . FLUoxetine (PROZAC) 40 MG capsule TAKE 1 CAPSULE EVERY DAY 90 capsule 1  . fluticasone (FLONASE) 50 MCG/ACT nasal spray Place 2 sprays into both nostrils daily. 16 g 2  . glucose blood (ONE TOUCH ULTRA TEST) test strip     . lactulose (CHRONULAC) 10 GM/15ML solution Take 20 g by mouth daily as needed.     . lidocaine-prilocaine (EMLA) cream APPLY TOPICALLY AS NEEDED. 30 g 0  . metFORMIN (GLUCOPHAGE-XR) 500 MG 24 hr tablet TAKE 1 TABLET (500 MG TOTAL) BY MOUTH DAILY WITH BREAKFAST. 90 tablet 1  . methocarbamol (ROBAXIN) 500 MG tablet Take 1 tablet (500 mg total) by mouth 4 (four) times daily. 120 tablet 0  . montelukast (SINGULAIR) 10 MG tablet Take 1 tablet by mouth on the day before treatment and for 2 days after treatment. 30 tablet 0  . Multiple Vitamin (MULTIVITAMIN) tablet Take 1 tablet by mouth  daily.    . Multiple Vitamins-Minerals (HAIR/SKIN/NAILS) TABS Take 1 tablet by mouth daily.    Marland Kitchen omeprazole (PRILOSEC) 40 MG capsule TAKE 1 CAPSULE EVERY DAY 90 capsule 3  . promethazine (PHENERGAN) 25 MG tablet Take 1 tablet (25 mg total) by mouth every 6 (six) hours as needed for nausea or vomiting. 90 tablet 0  . promethazine-dextromethorphan (PROMETHAZINE-DM) 6.25-15 MG/5ML syrup Take 5 mLs by mouth 4 (four) times daily as needed for cough. 240 mL 0  . traZODone (DESYREL) 100 MG tablet TAKE 1 TABLET AT BEDTIME  FOR  SLEEP 90 tablet 0  . fentaNYL (DURAGESIC - DOSED MCG/HR) 25 MCG/HR patch Place 25 mcg onto the skin every 3 (three) days.  (Patient not taking: Reported on 04/03/2020)    . tiZANidine (ZANAFLEX) 4 MG tablet Take 1 tablet (4 mg total) by mouth every 8 (eight) hours as needed for muscle spasms. (Patient not taking: Reported on 04/10/2020) 270 tablet 1   No current facility-administered medications for this visit.   Facility-Administered Medications Ordered in Other Visits  Medication Dose Route Frequency Provider Last Rate Last Admin  . 0.9 %  sodium chloride infusion   Intravenous Continuous Lequita Asal, MD   Stopped at 01/20/20 1232  . heparin lock flush 100 unit/mL  500 Units Intravenous Once Faythe Casa E, NP      . sodium chloride flush (NS) 0.9 % injection 10 mL  10 mL Intravenous PRN Lequita Asal, MD   10 mL at 02/07/20 0815  . sodium chloride flush (NS) 0.9 % injection 10 mL  10 mL Intracatheter PRN Cammie Sickle, MD   10 mL at 02/10/20 0815  . sodium chloride flush (NS) 0.9 % injection 10 mL  10 mL Intravenous PRN Jacquelin Hawking, NP   10 mL at 03/01/20 1143    Review of Systems  Constitutional: Negative.  Negative for chills, diaphoresis, fever, malaise/fatigue and weight loss (up 1 lb).       Feels "good."  HENT: Negative.  Negative for congestion, ear discharge, ear pain, hearing loss, nosebleeds, sinus pain, sore throat and tinnitus.    Eyes:  Negative.  Negative for blurred vision.  Respiratory: Negative for cough, hemoptysis, sputum production, shortness of breath and wheezing.   Cardiovascular: Negative.  Negative for chest pain, palpitations and leg swelling.  Gastrointestinal: Positive for nausea (chronic). Negative for abdominal pain, blood in stool, constipation, diarrhea, heartburn (on Prilosec), melena and vomiting.  Genitourinary: Negative.  Negative for dysuria, frequency and urgency.  Musculoskeletal: Positive for back pain (chronic). Negative for falls, joint pain, myalgias and neck pain.  Skin: Negative.  Negative for itching and rash.  Neurological: Positive for sensory change (chronic numbness in fingers and toes). Negative for dizziness, tingling, weakness and headaches.  Endo/Heme/Allergies: Negative for environmental allergies. Does not bruise/bleed easily.       Diabetes, well controllred.  Psychiatric/Behavioral: Negative.  Negative for depression and memory loss. The patient is not nervous/anxious and does not have insomnia.   All other systems reviewed and are negative.   Performance status (ECOG): 1  Vitals Blood pressure 137/81, pulse (!) 102, temperature 97.8 F (36.6 C), temperature source Tympanic, resp. rate 18, weight 144 lb 11.7 oz (65.6 kg), SpO2 100 %.  Physical Exam Vitals and nursing note reviewed.  Constitutional:      General: She is not in acute distress.    Appearance: She is well-developed. She is not ill-appearing, toxic-appearing or diaphoretic.     Interventions: Face mask in place.  HENT:     Head: Normocephalic and atraumatic.     Comments: Short curly blonde hair.    Mouth/Throat:     Mouth: Mucous membranes are moist.     Pharynx: Oropharynx is clear.  Eyes:     General: No scleral icterus.    Extraocular Movements: Extraocular movements intact.     Conjunctiva/sclera: Conjunctivae normal.     Pupils: Pupils are equal, round, and reactive to light.     Comments:  Glasses. Brown eyes.   Cardiovascular:     Rate and Rhythm: Normal rate and regular rhythm.     Heart sounds: Normal heart sounds. No murmur heard.   Pulmonary:     Effort: Pulmonary effort is normal. No respiratory distress.     Breath sounds: Normal breath sounds. No wheezing or rales.  Chest:     Chest wall: No tenderness.  Abdominal:     General: Bowel sounds are normal. There is no distension.     Palpations: Abdomen is soft. There is no hepatomegaly, splenomegaly or mass.     Tenderness: There is no abdominal tenderness. There is no guarding or rebound.  Musculoskeletal:        General: Tenderness (B/L ankles) present. No swelling. Normal range of motion.     Cervical back: Normal range of motion and neck supple.     Right lower leg: No edema.     Left lower leg: No edema.  Lymphadenopathy:     Head:     Right side of head: No preauricular, posterior auricular or occipital adenopathy.     Left side of head: No preauricular, posterior auricular or occipital adenopathy.     Cervical: No cervical adenopathy.     Upper Body:     Right upper body: No supraclavicular adenopathy.     Left upper body: No supraclavicular adenopathy.  Skin:    General: Skin is warm and dry.     Coloration: Skin is not jaundiced or pale.     Findings: No erythema.  Neurological:     Mental Status: She is alert and oriented to person, place,  and time. Mental status is at baseline.  Psychiatric:        Behavior: Behavior normal.        Thought Content: Thought content normal.        Judgment: Judgment normal.    Infusion on 04/10/2020  Component Date Value Ref Range Status  . Magnesium 04/10/2020 1.0* 1.7 - 2.4 mg/dL Final   Performed at [facility], [address] [place], [place] [address]  . Sodium 04/10/2020 138  135 - 145 mmol/L Final  . Potassium 04/10/2020 3.9  3.5 - 5.1 mmol/L Final  . Chloride 04/10/2020 103  98 - 111 mmol/L Final  . CO2 04/10/2020 25  22 - 32 mmol/L  Final  . Glucose, Bld 04/10/2020 147* 70 - 99 mg/dL Final   Glucose reference range applies only to samples taken after fasting for at least 8 hours.  . BUN 04/10/2020 24* 8 - 23 mg/dL Final  . Creatinine, Ser 04/10/2020 1.10* 0.44 - 1.00 mg/dL Final  . Calcium 04/10/2020 9.2  8.9 - 10.3 mg/dL Final  . Total Protein 04/10/2020 7.1  6.5 - 8.1 g/dL Final  . Albumin 04/10/2020 4.3  3.5 - 5.0 g/dL Final  . AST 04/10/2020 27  15 - 41 U/L Final  . ALT 04/10/2020 26  0 - 44 U/L Final  . Alkaline Phosphatase 04/10/2020 79  38 - 126 U/L Final  . Total Bilirubin 04/10/2020 0.5  0.3 - 1.2 mg/dL Final  . GFR, Estimated 04/10/2020 53* >60 mL/min Final  . Anion gap 04/10/2020 10  5 - 15 Final   Performed at [facility], [address], [place], [place] [address]  . WBC 04/10/2020 4.5  4.0 - 10.5 K/uL Final  . RBC 04/10/2020 3.66* 3.87 - 5.11 MIL/uL Final  . Hemoglobin 04/10/2020 10.7* 12.0 - 15.0 g/dL Final  . HCT 04/10/2020 32.1* 36 - 46 % Final  . MCV 04/10/2020 87.7  80.0 - 100.0 fL Final  . MCH 04/10/2020 29.2  26.0 - 34.0 pg Final  . MCHC 04/10/2020 33.3  30.0 - 36.0 g/dL Final  . RDW 04/10/2020 14.6  11.5 - 15.5 % Final  . Platelets 04/10/2020 186  150 - 400 K/uL Final  . nRBC 04/10/2020 0.0  0.0 - 0.2 % Final  . Neutrophils Relative % 04/10/2020 69  % Final  . Neutro Abs 04/10/2020 3.1  1.7 - 7.7 K/uL Final  . Lymphocytes Relative 04/10/2020 19  % Final  . Lymphs Abs 04/10/2020 0.9  0.7 - 4.0 K/uL Final  . Monocytes Relative 04/10/2020 9  % Final  . Monocytes Absolute 04/10/2020 0.4  0.1 - 1.0 K/uL Final  . Eosinophils Relative 04/10/2020 2  % Final  . Eosinophils Absolute 04/10/2020 0.1  0 - 0 K/uL Final  . Basophils Relative 04/10/2020 1  % Final  . Basophils Absolute 04/10/2020 0.1  0 - 0 K/uL Final  . Immature Granulocytes 04/10/2020 0  % Final  . Abs Immature Granulocytes 04/10/2020 0.02  0.00 - 0.07 K/uL Final   Performed at [facility], [address], [place],  [address]    Assessment:  Sarah [name] is a 64 y.o. 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 Bone marrow revealed 80% plasma cells. Lambda free light chains were 1340. She had nephrotic range proteinuria. She initially underwent induction with RVD. Revlimid maintenance was discontinued on  01/21/2017 secondary to intolerance.   Bone marrowaspirate andbiopsyon 01/18/2021revealed anormocellularmarrow withbut increased lambda-restricted plasma cells (9% aspirate, 40% CD138 immunohistochemistry).Findingswereconsistent with recurrent plasma cell myeloma.Flow cytometry revealed no monoclonal B-cell or phenotypically aberrant T-cell population. Cytogeneticswere 41, XX (normal). 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  M-spikehas been followed: 0 on 04/02/2016 -06/23/2019; 0.2 on 09/02/2017, 0.1 on 11/25/2019, 0.1 on 01/10/2020, 0.2 on 02/07/2020, 0.1 on 03/07/2020, and 0 on 04/10/2020.  Lambda light chainshave been followed: 22.2 (ratio 0.56) on 07/03/2017, 30.8 (ratio 0.78) on 09/02/2017, 36.9 (ratio 0.40) on 10/21/2017, 37.4 (ratio 0.41) on 12/16/2017, 70.7(ratio 0.31) on 02/17/2018, 64.2 (ratio 0.27) on 04/07/2018, 78.9 (ratio 0.18) on 05/26/2018, 128.8 (ratio 0.17) on 08/06/2018, 181.5 (ratio 0.13) on 10/08/2018, 130.9 (ratio 0.13) on 10/20/2018, 160.7 (ratio 0.10)on 12/09/2018, 236.6 (ratio 0.07) on 02/01/2019, 363.6 (ratio 0.04) on 03/22/2019, 404.8 (ratio 0.04) on 04/05/2019, 420.7 (ratio 0.03) on 05/24/2019, 573.4 (ratio 0.03) on 06/23/2019, 451.05(ratio 0.02) on02/19/2021, 47.2 (ratio 0.13) on 10/25/2019, 22.4 (ratio 0.21) on 11/25/2019, 16.5 (ratio 0.33) on 01/10/2020, 14.6 (ratio 0.34) on 02/07/2020, 13.1 (ratio 0.31) on 03/07/2020, and 10.1 (ratio 0.38) on 04/10/2020.  24 hour UPEPon 06/03/2019 revealed kappa free light  chains95.76,lambda free light chains1,260.71, andratio 0.08.24 hourUPEPon 02/22/2021revealed totalproteinof749m/24 hrs withlambdafree light chains 1,084.111mL andratioof0.10(1.03-31.76). M spike inurinewas46.1%(36123m4 hrs).  Bone surveyon 04/08/2016 and11/28/2018 revealed no definite lytic lesion seen in the visualized skeleton.Bone surveyon 11/19/2018 revealed no suspicious lucent lesionsand no acute bony abnormality.PET scanon 07/12/2019 revealed no focal metabolic activity to suggest active myeloma within the skeleton. There wereno lytic lesions identified on the CT portion of the examorsoft tissue plasmacytomas. There was no evidence of multiple myeloma.  Pretreatment RBC phenotypeon 09/23/2019 waspositivefor C, e, DUFFY B, KIDD B, M, S, and s antigen; negativefor c, E, KELL, DUFFY A, KIDD A, and N antigen.  Shereceived 6 cycles of daratumumab and hyaluronidase-fihj, Pomalyst, and Decadron (DPd)(09/27/2019 - 10/25/2019; 12/09/2019 - 03/13/2020). Cycle #1 was complicated by fever and neutropenia requiring admission.  Cycle #2 was complicated by pneumonia requiring admission.  Cycle #6 was complicated with an ER evaluation for an elevated lactic acid.  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 Zometa was discontinued in 01/2017. She has chronic nauseaon Phenergan.  She has B12 deficiency. B12 was 254 on 04/09/2017, 295 on 08/20/2018, and 391 on 10/08/2018. Shewasonoral B12.She received B12 monthly (last08/05/2020).Folate was18.6on 02/08/2019.  She has iron deficiency. Ferritin was 32 on 07/01/2019. She received Venoferon 07/15/2019 and 07/22/2019.  She has hypogammaglobulinemia.  IgG was 245 on 11/25/2019.  She received monthly IVIG (07/22/021 - 03/22/2020).  She received IVIG 400 mg/kg on 01/20/2020, 200 mg/kg on 02/17/2020, and 300 mg/kg on 03/22/2020.  IVIG on 01/08/93/1740s complicated by acute  renal failure. IgG trough level was 418 on 02/17/2020.  [facility] with fever and neutropenia. Cultures were negative. CXR was negative. She received broad spectrum antibiotics and daily Granix. She received IVF for acute renal insufficiency due to diarrhea and dehydration. Creatine was 1.66 on admission and 1.12 on discharge.  [facility] with fever and neutropenia. CXR revealed no active cardiopulmonary disease. Chest CT with contrast revealed no acute intrathoracic pathology. There were findings which could be suggestive of prior granulomatous disease. She was treated with Cefepime and Vancomycin, then switched to ciprofloxacin on 03/03/2020.   She [place] the [place]/22/2021 and 10/09/2019.  Symptomatically, she is doing well.  She is ready to  begin pre-transplant evaluation.  Plan: 1.   Labs today:  CBC with diff, CMP, Mg, SPEP, FLCA. 2. Stage III multiple myeloma  Symptomatically, she is doing extremely well.   Lambda free light chains have improved from451.05on02/19/2021. After cycle #1: 47.2. After cycle #6:10.1. Labs are reviewed. Contact Dr Virgina Norfolk- done. Chemotherapy on hold.  Transplant evaluation upcoming.  Discuss symptom management.  She has antiemetics and pain medications at home to use on a prn bases.  Interventions are adequate.   .     Patient notes plan for stem cell transplant reassessment on 04/04/2020.   Anticipate follow-up in clinic after transplant. 3.   Normocytic anemia  Hematocrit 32.1. Hemoglobin 10.7. MCV 87.7 on 04/10/2020.   Ferritin51 on 01/10/2020. TSH was normal on02/20/2020.  Creatinine 1.10(stable).  Continue to monitor. 4. Grade II peripheral neuropathy  Neuropathy  is stable.  Energy and performance status ahve improved. 5.   Hypogammaglobulinemia             Patient has had recurrent infections.               Shereceived her first dose ofIVIG(25 gm)on 01/20/2020 with subsequent renal insufficiency. Shereceived her second dose of IVIG(15 gm)on 08/19/2021without renal insufficiency.   She received her third dose of IVIG (20 gm) on 03/22/2020 without renal insufficiency. Infusion time is increased. She receives pre and post hydration the day of and the day after infusion. Goal IgGtrough level is 500.             Discuss plans for continuation of IVIG monthly. 6.   Chronic hypomagnesemia Magnesium1.0 today. Magnesium 6 g IV today.  Continue twice weekly magnesium checks. 7.   No daratumumab today (patient being set up for stem cell transplant). 8.   Magnesium 6 gm IV today.  Phenergan IV today. 9.   RTC on Thursday for Mg and Phenergan IV. 10.   RTC every Monday and Thursday x 3 weeks for labs (BMP, Mg) and IV Mg + Phenergan. 11.   RTC on 04/24/2020 for MD assessment, + scheduled labs- add IgG level, Mg, IVIG infusion + fluids. 12.   RTC on 04/25/2020 for IV fluids s/p IVIG. 13.   Patient to contact clnici after transplant for follow-up.  I discussed the assessment and treatment plan with the patient.  The patient was provided an opportunity to ask questions and all were answered.  The patient agreed with the plan and demonstrated an understanding of the instructions.  The patient was advised to call back if the symptoms worsen or if the condition fails to improve as anticipated.   I provided 14 minutes of face-to-face time during this this encounter and > 50% was spent counseling as documented under my assessment and plan.  An additional 10 minutes were spent reviewing her chart (Epic and Care Everywhere) including notes, labs, and imaging studies.    Lequita Asal,  MD, PhD    04/10/2020, 8:55 AM   I, Mirian Mo Tufford, am acting as Education administrator for [employer]. Mike Gip, MD, PhD.  I, Melissa C. Mike Gip, MD, have reviewed the above documentation for accuracy and completeness, and I agree with the above.

## 2020-04-06 NOTE — Progress Notes (Signed)
Nutrition Follow-up:  Patient with multiple myeloma.    Spoke with patient via phone for nutrition follow-up. Patient reports that she will be having transplant on 11/8 at [facility]. Patient reports that she has a good appetite and nibbles all day long.  Likes peanut butter crackers, yogurt, english muffin.  Dinner she or her sisters cook beef roast and vegetables, tuna casserole, beef stroganoff.  Does drink oral nutrition supplements at times.      Medications: reviewed  Labs: reviewed  Anthropometrics:   Weight is 144 lb on 10/4.  Reports weight was 146 lb at [facility] recently.  Increased from 141 lb    NUTRITION DIAGNOSIS: Inadequate oral intake continues   INTERVENTION:  Stressed importance of good nutrition prior transplant.   Patient has contact information and will reach out to RD if needed in the future    NEXT VISIT: no follow-up  Keltie Labell B. Zenia Resides, [place], [place] Registered Dietitian [phone] (mobile)

## 2020-04-07 ENCOUNTER — Telehealth: Payer: Self-pay | Admitting: *Deleted

## 2020-04-07 ENCOUNTER — Telehealth: Payer: Self-pay

## 2020-04-07 NOTE — Telephone Encounter (Signed)
Completed.    Thanks

## 2020-04-07 NOTE — Telephone Encounter (Signed)
I spoke with Dr Mike Gip on 04/06/2020 about this patient continue Pomalyst. The patient reports she is schedule for a BMT in 05/2020 and Per Dr Mike Gip she will not need to continue Pomalyst. I have reached out to [facility] @ [phone] to inform them that the patient will not need another rx to continue this medication.

## 2020-04-07 NOTE — Telephone Encounter (Signed)
[facility] called asking if patient will need another prescription for her Pomalyst since she is having a BMT. Please return their call [phone]

## 2020-04-10 ENCOUNTER — Inpatient Hospital Stay (HOSPITAL_BASED_OUTPATIENT_CLINIC_OR_DEPARTMENT_OTHER): Payer: Medicare PPO | Admitting: Hematology and Oncology

## 2020-04-10 ENCOUNTER — Inpatient Hospital Stay: Payer: Medicare PPO

## 2020-04-10 ENCOUNTER — Encounter: Payer: Self-pay | Admitting: Hematology and Oncology

## 2020-04-10 ENCOUNTER — Other Ambulatory Visit: Payer: Self-pay

## 2020-04-10 VITALS — BP 137/81 | HR 102 | Temp 97.8°F | Resp 18 | Wt 144.7 lb

## 2020-04-10 DIAGNOSIS — E119 Type 2 diabetes mellitus without complications: Secondary | ICD-10-CM | POA: Diagnosis not present

## 2020-04-10 DIAGNOSIS — Z9484 Stem cells transplant status: Secondary | ICD-10-CM | POA: Diagnosis not present

## 2020-04-10 DIAGNOSIS — D649 Anemia, unspecified: Secondary | ICD-10-CM | POA: Diagnosis not present

## 2020-04-10 DIAGNOSIS — R110 Nausea: Secondary | ICD-10-CM | POA: Diagnosis not present

## 2020-04-10 DIAGNOSIS — D801 Nonfamilial hypogammaglobulinemia: Secondary | ICD-10-CM | POA: Diagnosis not present

## 2020-04-10 DIAGNOSIS — C9000 Multiple myeloma not having achieved remission: Secondary | ICD-10-CM

## 2020-04-10 DIAGNOSIS — Z7189 Other specified counseling: Secondary | ICD-10-CM | POA: Diagnosis not present

## 2020-04-10 DIAGNOSIS — I10 Essential (primary) hypertension: Secondary | ICD-10-CM | POA: Diagnosis not present

## 2020-04-10 DIAGNOSIS — G629 Polyneuropathy, unspecified: Secondary | ICD-10-CM | POA: Diagnosis not present

## 2020-04-10 DIAGNOSIS — R52 Pain, unspecified: Secondary | ICD-10-CM | POA: Diagnosis not present

## 2020-04-10 LAB — COMPREHENSIVE METABOLIC PANEL
ALT: 26 U/L (ref 0–44)
AST: 27 U/L (ref 15–41)
Albumin: 4.3 g/dL (ref 3.5–5.0)
Alkaline Phosphatase: 79 U/L (ref 38–126)
Anion gap: 10 (ref 5–15)
BUN: 24 mg/dL — ABNORMAL HIGH (ref 8–23)
CO2: 25 mmol/L (ref 22–32)
Calcium: 9.2 mg/dL (ref 8.9–10.3)
Chloride: 103 mmol/L (ref 98–111)
Creatinine, Ser: 1.1 mg/dL — ABNORMAL HIGH (ref 0.44–1.00)
GFR, Estimated: 53 mL/min — ABNORMAL LOW (ref >60)
Glucose, Bld: 147 mg/dL — ABNORMAL HIGH (ref 70–99)
Potassium: 3.9 mmol/L (ref 3.5–5.1)
Sodium: 138 mmol/L (ref 135–145)
Total Bilirubin: 0.5 mg/dL (ref 0.3–1.2)
Total Protein: 7.1 g/dL (ref 6.5–8.1)

## 2020-04-10 LAB — CBC WITH DIFFERENTIAL/PLATELET
Abs Immature Granulocytes: 0.02 10*3/uL (ref 0.00–0.07)
Basophils Absolute: 0.1 10*3/uL (ref 0.0–0.1)
Basophils Relative: 1 %
Eosinophils Absolute: 0.1 10*3/uL (ref 0.0–0.5)
Eosinophils Relative: 2 %
HCT: 32.1 % — ABNORMAL LOW (ref 36.0–46.0)
Hemoglobin: 10.7 g/dL — ABNORMAL LOW (ref 12.0–15.0)
Immature Granulocytes: 0 %
Lymphocytes Relative: 19 %
Lymphs Abs: 0.9 10*3/uL (ref 0.7–4.0)
MCH: 29.2 pg (ref 26.0–34.0)
MCHC: 33.3 g/dL (ref 30.0–36.0)
MCV: 87.7 fL (ref 80.0–100.0)
Monocytes Absolute: 0.4 10*3/uL (ref 0.1–1.0)
Monocytes Relative: 9 %
Neutro Abs: 3.1 10*3/uL (ref 1.7–7.7)
Neutrophils Relative %: 69 %
Platelets: 186 10*3/uL (ref 150–400)
RBC: 3.66 MIL/uL — ABNORMAL LOW (ref 3.87–5.11)
RDW: 14.6 % (ref 11.5–15.5)
WBC: 4.5 10*3/uL (ref 4.0–10.5)
nRBC: 0 % (ref 0.0–0.2)

## 2020-04-10 LAB — MAGNESIUM: Magnesium: 1 mg/dL — ABNORMAL LOW (ref 1.7–2.4)

## 2020-04-10 MED ORDER — SODIUM CHLORIDE 0.9% FLUSH
10.0000 mL | Freq: Once | INTRAVENOUS | Status: AC | PRN
  Administered 2020-04-10: 10 mL
  Filled 2020-04-10: qty 10

## 2020-04-10 MED ORDER — CYANOCOBALAMIN 1000 MCG/ML IJ SOLN
1000.0000 ug | Freq: Once | INTRAMUSCULAR | Status: AC
  Administered 2020-04-10: 1000 ug via INTRAMUSCULAR
  Filled 2020-04-10: qty 1

## 2020-04-10 MED ORDER — MAGNESIUM SULFATE 2 GM/50ML IV SOLN
2.0000 g | Freq: Once | INTRAVENOUS | Status: AC
  Administered 2020-04-10: 2 g via INTRAVENOUS
  Filled 2020-04-10: qty 50

## 2020-04-10 MED ORDER — SODIUM CHLORIDE 0.9 % IV SOLN
Freq: Once | INTRAVENOUS | Status: AC
  Filled 2020-04-10: qty 250

## 2020-04-10 MED ORDER — SODIUM CHLORIDE 0.9 % IV SOLN: 6.0000 g | Freq: Once | INTRAVENOUS | Status: DC

## 2020-04-10 MED ORDER — HEPARIN SOD (PORK) LOCK FLUSH 100 UNIT/ML IV SOLN
500.0000 [IU] | Freq: Once | INTRAVENOUS | Status: AC | PRN
  Administered 2020-04-10: 500 [IU]
  Filled 2020-04-10: qty 5

## 2020-04-10 MED ORDER — MAGNESIUM SULFATE 4 GM/100ML IV SOLN
4.0000 g | Freq: Once | INTRAVENOUS | Status: AC
  Administered 2020-04-10: 4 g via INTRAVENOUS
  Filled 2020-04-10: qty 100

## 2020-04-10 MED ORDER — PROMETHAZINE HCL 25 MG/ML IJ SOLN
25.0000 mg | Freq: Once | INTRAMUSCULAR | Status: AC
  Administered 2020-04-10: 25 mg via INTRAVENOUS
  Filled 2020-04-10: qty 1

## 2020-04-10 NOTE — Progress Notes (Signed)
Patient here for oncology follow-up appointment, expresses no complaints or concerns at this time.    

## 2020-04-11 LAB — PROTEIN ELECTROPHORESIS, SERUM
A/G Ratio: 1.4 (ref 0.7–1.7)
Albumin ELP: 3.8 g/dL (ref 2.9–4.4)
Alpha-1-Globulin: 0.3 g/dL (ref 0.0–0.4)
Alpha-2-Globulin: 1 g/dL (ref 0.4–1.0)
Beta Globulin: 0.9 g/dL (ref 0.7–1.3)
Gamma Globulin: 0.5 g/dL (ref 0.4–1.8)
Globulin, Total: 2.7 g/dL (ref 2.2–3.9)
Total Protein ELP: 6.5 g/dL (ref 6.0–8.5)

## 2020-04-11 LAB — KAPPA/LAMBDA LIGHT CHAINS
Kappa free light chain: 3.8 mg/L (ref 3.3–19.4)
Kappa, lambda light chain ratio: 0.38 (ref 0.26–1.65)
Lambda free light chains: 10.1 mg/L (ref 5.7–26.3)

## 2020-04-12 ENCOUNTER — Other Ambulatory Visit: Payer: Self-pay

## 2020-04-12 ENCOUNTER — Ambulatory Visit: Payer: Medicare PPO | Attending: Hematology and Oncology

## 2020-04-12 ENCOUNTER — Encounter: Payer: Self-pay | Admitting: Physical Therapy

## 2020-04-12 DIAGNOSIS — R202 Paresthesia of skin: Secondary | ICD-10-CM | POA: Insufficient documentation

## 2020-04-12 DIAGNOSIS — G894 Chronic pain syndrome: Secondary | ICD-10-CM

## 2020-04-12 DIAGNOSIS — M6281 Muscle weakness (generalized): Secondary | ICD-10-CM | POA: Insufficient documentation

## 2020-04-12 DIAGNOSIS — R200 Anesthesia of skin: Secondary | ICD-10-CM | POA: Insufficient documentation

## 2020-04-12 NOTE — Patient Instructions (Signed)
Access Code: [email or web address] URL: [email or web address] Date: 04/12/2020 Prepared by: Lieutenant Diego Program Notes Pick 2-3 per day to complete from each category categories are as follows: lower body, abdominals, upper body Exercises Supine Chin Tuck - 1 x daily - 7 x weekly - 3 sets - 10 reps Seated Cervical Retraction - 1 x daily - 7 x weekly - 3 sets - 10 reps Supine Bridge - 1 x daily - 7 x weekly - 2 sets - 10 reps Supine Figure 4 Piriformis Stretch - 1 x daily - 7 x weekly - 3 sets - 10 reps Straight Leg Raise - 1 x daily - 7 x weekly - 3 sets - 10 reps Clamshell - 1 x daily - 7 x weekly - 2 sets - 10 reps Hooklying Transversus Abdominis Palpation - 1 x daily - 7 x weekly - 2 sets - 10 reps Supine Transversus Abdominis Bracing with Heel Slide - 1 x daily - 7 x weekly - 3 sets - 10 reps Seated Bicep Curls Supinated with Dumbbells - 1 x daily - 7 x weekly - 2 sets - 10 reps Seated Triceps Extension with Dumbbell Single Arm - 1 x daily - 7 x weekly - 2 sets - 10 reps Supine Chest Press with Dumbbells - 1 x daily - 7 x weekly - 3 sets - 10 reps Seated Single Arm Overhead Elbow Extension - 1 x daily - 7 x weekly - 3 sets - 10 reps Sit to Stand without Arm Support - 1 x daily - 7 x weekly - 2 sets - 10 reps Standing Marching - 1 x daily - 7 x weekly - 2 sets - 10 reps Mini Squat with Counter Support - 1 x daily - 7 x weekly - 3 sets - 10 reps Partial Lunge with Chair - 1 x daily - 7 x weekly - 3 sets - 10 reps Standing Hip Abduction with Counter Support - 1 x daily - 7 x weekly - 3 sets - 10 reps Standing Hip Extension with Counter Support - 1 x daily - 7 x weekly - 3 sets - 10 reps Seated Upper Trapezius Stretch - 1 x daily - 7 x weekly - 3 sets - 10 reps Seated Levator Scapulae Stretch - 1 x daily - 7 x weekly - 3 sets - 10 reps

## 2020-04-12 NOTE — Therapy (Signed)
[facility] [address] [place], [place], [address] Phone: [phone]   Fax:  [phone]  Physical Therapy Treatment, Recertification, and Discharge Recert Dates: 0/97/3532-99/24/2683  Patient Details  Name: Sarah [name] MRN: [id number] Date of Birth: Nov 10, 1955 Referring Provider (PT): Nolon Stalls, MD   Encounter Date: 04/12/2020   PT End of Session - 04/12/20 0736    Visit Number 7    Number of Visits 9    Date for PT Re-Evaluation 04/04/20    Authorization - Visit Number 7    Authorization - Number of Visits 10    PT Start Time 0730    PT Stop Time 0815    PT Time Calculation (min) 45 min    Activity Tolerance Patient tolerated treatment well    Behavior During Therapy [facility] for tasks assessed/performed           Past Medical History:  Diagnosis Date  . Abnormal stress test 02/14/2016   Overview:  Added automatically from request for surgery [id number]  . Anemia   . Anxiety   . Arthritis   . Bicuspid aortic valve   . CHF (congestive heart failure) ([place])   . CKD (chronic kidney disease) stage 3, GFR 30-59 ml/min (HCC)   . Depression   . Diabetes mellitus ([place])   . Dizziness   . Fatty liver   . Frequent falls   . GERD (gastroesophageal reflux disease)   . Gout   . Heart murmur   . History of blood transfusion   . History of bone marrow transplant ([place])   . History of uterine fibroid   . Hx of cardiac catheterization 06/05/2016   Overview:  Normal coronaries 2017  . Hypertension   . Hypomagnesemia   . Multiple myeloma ([place])   . Personal history of chemotherapy   . Renal cyst     Past Surgical History:  Procedure Laterality Date  . ABDOMINAL HYSTERECTOMY    . 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 CARDIAC ELECTROPHYSIOLOGY MAPPING AND ABLATION    . CARPAL TUNNEL RELEASE Bilateral   . CHOLECYSTECTOMY  2008  . COLONOSCOPY WITH PROPOFOL N/A 05/07/2017   Procedure: COLONOSCOPY WITH PROPOFOL;  Surgeon:  [name], MD;  Location: [facility] ENDOSCOPY;  Service: Gastroenterology;  Laterality: N/A;  . ESOPHAGOGASTRODUODENOSCOPY (EGD) WITH PROPOFOL N/A 05/07/2017   Procedure: ESOPHAGOGASTRODUODENOSCOPY (EGD) WITH PROPOFOL;  Surgeon: [name], MD;  Location: [facility] ENDOSCOPY;  Service: Gastroenterology;  Laterality: N/A;  . FOOT SURGERY Bilateral   . INCONTINENCE SURGERY  2009  . INTERSTIM IMPLANT PLACEMENT    . other     over active bladder  . OTHER SURGICAL HISTORY     bladder stimulator   . PARTIAL HYSTERECTOMY  03/1996   fibroids  . PORTA CATH INSERTION N/A 03/10/2019   Procedure: PORTA CATH INSERTION;  Surgeon: Algernon Huxley, MD;  Location: [facility];  Service: Cardiovascular;  Laterality: N/A;  . TONSILLECTOMY  2007    There were no vitals filed for this visit.   Subjective Assessment - 04/12/20 0734    Subjective Pt. reports that she is receiving her bone marrow transplant on Nov 8. She is feeling good today, she attributes it to a new muscle relaxer she's on.    Pertinent History pt has multiple myeloma, diagnosed in 2016. has bladder stimulator.    Patient Stated Goals Increase strength    Currently in Pain? No/denies    Pain Onset More than a month ago  There Ex:  Nustep L2 10 mins (unbilled)   See HEP below, several exercises demonstrated, rest verbally reviewed: Exercises Supine Chin Tuck Seated Cervical Retraction Supine Bridge  Supine Figure 4 Piriformis Stretch  Straight Leg Raise  Clamshell Hooklying Transversus Abdominis Palpation Supine Transversus Abdominis Bracing with Heel Slide Seated Bicep Curls Supinated with Dumbbells Seated Triceps Extension with Dumbbell Single Arm Supine Chest Press with Dumbbells Seated Single Arm Overhead Elbow Extension  Sit to Stand without Arm Support Standing Marching  Mini Squat with Counter Support  Partial Lunge with Chair  Standing Hip Abduction with Counter Support Standing Hip Extension with  Counter Support Seated Upper Trapezius Stretch  Seated Levator Scapulae Stretch   Manual:   Supine cervical paraspinals STM: 10 mins. Trigger points noted in bilat paraspinals, worse on L than R. Pt responded well to [facility] with report of "feeling better".                             PT Long Term Goals - 04/12/20 9794      PT LONG TERM GOAL #1   Title Pt. will be able to walk for 10 minutes without pain increasing above 3/10 in low back to improve functional mobility.    Baseline initial: pt can only walk 3-5 mins before onset of pain 6-7/10.; 9/15: pt able to walk approx 1:40 before onset of back pain 3/10. 10/12: pt reports ability to walk for about 10 mins with pain less than 3/10    Time 8    Period Weeks    Status Achieved    Target Date 04/12/20      PT LONG TERM GOAL #2   Title Pt will improve 5XSTS to 15 seconds or less to improve overall strength and functional mobility.    Baseline initial: 25.57s; 9/15: 16.34 sec; 10/13: not formally tested today, however pt reports feeling more powerful with ability to complete sit to stands    Time 8    Period Weeks    Status Partially Met    Target Date 04/12/20      PT LONG TERM GOAL #3   Title Pt will improve global strength to at least 4/5 to improve functional mobility.    Baseline R/L 4/4 Traps, 4-/4- Shoulder abduction, 3/4 Shoulder ER, 3/4 Shoulder IR, 4/4+ Biceps, 3+/4 Triceps, 3+/3+ Hip flexion, 4-/4- Hip abduction, 5/5 Hip adduction (seated), 5/5 Knee extension, 4-/4- Knee flexion, 4/4 Ankle dorsiflexion; 9/15: R/L. 5/5 traps, 3+/3+ shoulder abd, 3+/3+ shoulder flex 3+/3+ shoulder ER, 3+/3+ shoulder IR, 4/4 Biceps, 3+/3+ Triceps, 3+/3+ Hip flexion, 4/4 Hip abd, 4+/4+ Hip add, 4+/5 knee ext, 4/4 knee flex, 4/4+ ankle dorsiflexion. 10/13: Not formally tested at this time, however pt reports feeling much stronger and is able to do more around the house.    Time 8    Period Weeks    Status Partially Met     Target Date 04/12/20                 Plan - 04/12/20 1048    Clinical Impression Statement Pt. discharge session today. PT reassesed goals, pt has met her walking goal and reports ability to walk for 10 mins with pain not increasing above 3/10. 5xSTS and strength not formally assessed today, however pt reported feeling much stronger and feels like she has improved her ability to complete STS easier. Pt. taken through extensive HEP to continue strengthening at home. Pt. responded  well to STM on bilat cervical paraspinals. Pt. receiving bone marrow transplant Nov. 8, 2021. Pt. agreeable to discharge at this time with HEP. Pt. instructed to call clinic in case of any regression or return of sx.    Personal Factors and Comorbidities Comorbidity 2;Comorbidity 1    Comorbidities cancer    Stability/Clinical Decision Making Evolving/Moderate complexity    Clinical Decision Making Moderate    Rehab Potential Good    PT Frequency 1x / week    PT Duration 8 weeks    PT Treatment/Interventions ADLs/Self Care Home Management;Cryotherapy;Neuromuscular re-education;Balance training;Therapeutic exercise;Therapeutic activities;Functional mobility training;Gait training;Patient/family education;Manual techniques    PT Home Exercise Plan [email or web address]    Consulted and Agree with Plan of Care Patient           Patient will benefit from skilled therapeutic intervention in order to improve the following deficits and impairments:  Abnormal gait, Decreased activity tolerance, Decreased balance, Decreased mobility, Decreased strength, Impaired sensation, Postural dysfunction, Difficulty walking  Visit Diagnosis: Muscle weakness (generalized)  Chronic pain syndrome  Numbness and tingling     Problem List Patient Active Problem List   Diagnosis Date Noted  . Yeast vaginitis 03/18/2020  . Hypotension 03/18/2020  . Hypokalemia 03/11/2020  . Malnutrition of moderate degree 03/03/2020  . Antineoplastic  chemotherapy induced pancytopenia ([place]) 03/03/2020  . Dyspnea 03/02/2020  . Physical deconditioning 02/07/2020  . Impaired nutrition 02/07/2020  . 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 Constipation 10/24/2019  . Encounter for antineoplastic immunotherapy 10/03/2019  . Hypocalcemia 08/28/2019  . Stage 3 chronic kidney disease ([place]) 05/12/2019  . Chronic nausea 12/17/2018  . Chemotherapy-induced neuropathy ([place]) 12/17/2018  . Hyperlipidemia associated with type 2 diabetes mellitus ([place]) 11/25/2018  . Normocytic anemia 10/21/2018  . Hypomagnesemia 08/20/2018  . Goals of care, counseling/discussion 08/20/2018  . B12 deficiency 08/20/2018  . Thrombocytopenia ([place]) 02/09/2018  . Benign essential HTN 08/21/2017  . Cardiomyopathy, idiopathic ([place]) 08/21/2017  . Carotid artery stenosis, asymptomatic, bilateral 08/06/2017  . Thyroid nodule 08/06/2017  . Cardiac syncope 07/25/2017  . Iron deficiency anemia due to chronic blood loss 05/13/2017  . Spinal stenosis of lumbar region with neurogenic claudication 04/09/2017  . 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 LBBB (left bundle branch block) 10/10/2016  . Long term prescription opiate use 09/07/2016  . Chronic pain syndrome 07/08/2016  . Pain medication agreement signed 07/08/2016  . Spondylosis of lumbar region without myelopathy or radiculopathy 07/08/2016  . Chronic diastolic CHF (congestive heart failure), NYHA class 2 ([place]) 06/05/2016  . LVH (left ventricular hypertrophy) due to hypertensive disease, with heart failure ([place]) 06/05/2016  . Mild aortic valve stenosis 06/05/2016  . 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[place]) 06/05/2016  . Environmental and seasonal allergies 04/19/2016  . Insomnia 04/19/2016  . Bicuspid aortic valve 04/19/2016  . Asthma 04/19/2016  . Aortic regurgitation 04/19/2016  . Type II diabetes mellitus with complication ([place]) 62/26/3335  . Depression, major, single episode, in partial remission  ([place]) 04/12/2016  . Irritable bowel syndrome (IBS) 04/12/2016  . Hepatic steatosis 04/12/2016  . Multiple myeloma not having achieved remission ([place]) 04/02/2016  . 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[place]) 07/06/2015  . Stem cell transplant candidate 05/16/2015    Carlyle Basques, SPT 04/12/2020, 11:13 AM  [place] [facility] [address] [place], [place], [address] Phone: [phone]   Fax:  [phone]  Name: Sarah [name] MRN: [id number] Date of Birth: 1955/07/09

## 2020-04-13 ENCOUNTER — Inpatient Hospital Stay: Payer: Medicare PPO

## 2020-04-13 VITALS — BP 138/84 | HR 90 | Temp 98.6°F | Resp 18

## 2020-04-13 DIAGNOSIS — I10 Essential (primary) hypertension: Secondary | ICD-10-CM | POA: Diagnosis not present

## 2020-04-13 DIAGNOSIS — R52 Pain, unspecified: Secondary | ICD-10-CM | POA: Diagnosis not present

## 2020-04-13 DIAGNOSIS — R110 Nausea: Secondary | ICD-10-CM | POA: Diagnosis not present

## 2020-04-13 DIAGNOSIS — D801 Nonfamilial hypogammaglobulinemia: Secondary | ICD-10-CM | POA: Diagnosis not present

## 2020-04-13 DIAGNOSIS — D649 Anemia, unspecified: Secondary | ICD-10-CM | POA: Diagnosis not present

## 2020-04-13 DIAGNOSIS — G629 Polyneuropathy, unspecified: Secondary | ICD-10-CM | POA: Diagnosis not present

## 2020-04-13 DIAGNOSIS — C9000 Multiple myeloma not having achieved remission: Secondary | ICD-10-CM | POA: Diagnosis not present

## 2020-04-13 DIAGNOSIS — E119 Type 2 diabetes mellitus without complications: Secondary | ICD-10-CM | POA: Diagnosis not present

## 2020-04-13 LAB — MAGNESIUM: Magnesium: 1.2 mg/dL — ABNORMAL LOW (ref 1.7–2.4)

## 2020-04-13 MED ORDER — PROMETHAZINE HCL 25 MG/ML IJ SOLN
25.0000 mg | Freq: Once | INTRAMUSCULAR | Status: AC
  Administered 2020-04-13: 25 mg via INTRAVENOUS
  Filled 2020-04-13: qty 1

## 2020-04-13 MED ORDER — MAGNESIUM SULFATE 4 GM/100ML IV SOLN
4.0000 g | Freq: Once | INTRAVENOUS | Status: AC
  Administered 2020-04-13: 4 g via INTRAVENOUS
  Filled 2020-04-13: qty 100

## 2020-04-13 MED ORDER — SODIUM CHLORIDE 0.9% FLUSH
10.0000 mL | Freq: Once | INTRAVENOUS | Status: AC | PRN
  Administered 2020-04-13: 10 mL
  Filled 2020-04-13: qty 10

## 2020-04-13 MED ORDER — MAGNESIUM SULFATE 2 GM/50ML IV SOLN
2.0000 g | Freq: Once | INTRAVENOUS | Status: AC
  Administered 2020-04-13: 2 g via INTRAVENOUS
  Filled 2020-04-13: qty 50

## 2020-04-13 MED ORDER — SODIUM CHLORIDE 0.9 % IV SOLN: 6.0000 g | Freq: Once | INTRAVENOUS | Status: DC

## 2020-04-13 MED ORDER — SODIUM CHLORIDE 0.9 % IV SOLN
Freq: Once | INTRAVENOUS | Status: AC
  Filled 2020-04-13: qty 250

## 2020-04-13 MED ORDER — HEPARIN SOD (PORK) LOCK FLUSH 100 UNIT/ML IV SOLN
500.0000 [IU] | Freq: Once | INTRAVENOUS | Status: AC | PRN
  Administered 2020-04-13: 500 [IU]
  Filled 2020-04-13: qty 5

## 2020-04-17 ENCOUNTER — Inpatient Hospital Stay: Payer: Medicare PPO

## 2020-04-17 ENCOUNTER — Other Ambulatory Visit: Payer: Self-pay

## 2020-04-17 VITALS — BP 125/81 | HR 96 | Temp 96.2°F | Resp 18

## 2020-04-17 DIAGNOSIS — C9000 Multiple myeloma not having achieved remission: Secondary | ICD-10-CM

## 2020-04-17 DIAGNOSIS — R52 Pain, unspecified: Secondary | ICD-10-CM | POA: Diagnosis not present

## 2020-04-17 DIAGNOSIS — R110 Nausea: Secondary | ICD-10-CM | POA: Diagnosis not present

## 2020-04-17 DIAGNOSIS — D801 Nonfamilial hypogammaglobulinemia: Secondary | ICD-10-CM | POA: Diagnosis not present

## 2020-04-17 DIAGNOSIS — D649 Anemia, unspecified: Secondary | ICD-10-CM | POA: Diagnosis not present

## 2020-04-17 DIAGNOSIS — Z95828 Presence of other vascular implants and grafts: Secondary | ICD-10-CM

## 2020-04-17 DIAGNOSIS — E119 Type 2 diabetes mellitus without complications: Secondary | ICD-10-CM | POA: Diagnosis not present

## 2020-04-17 DIAGNOSIS — I10 Essential (primary) hypertension: Secondary | ICD-10-CM | POA: Diagnosis not present

## 2020-04-17 DIAGNOSIS — G629 Polyneuropathy, unspecified: Secondary | ICD-10-CM | POA: Diagnosis not present

## 2020-04-17 LAB — BASIC METABOLIC PANEL
Anion gap: 10 (ref 5–15)
BUN: 29 mg/dL — ABNORMAL HIGH (ref 8–23)
CO2: 23 mmol/L (ref 22–32)
Calcium: 9.2 mg/dL (ref 8.9–10.3)
Chloride: 103 mmol/L (ref 98–111)
Creatinine, Ser: 1.1 mg/dL — ABNORMAL HIGH (ref 0.44–1.00)
GFR, Estimated: 53 mL/min — ABNORMAL LOW (ref >60)
Glucose, Bld: 163 mg/dL — ABNORMAL HIGH (ref 70–99)
Potassium: 3.9 mmol/L (ref 3.5–5.1)
Sodium: 136 mmol/L (ref 135–145)

## 2020-04-17 LAB — MAGNESIUM: Magnesium: 1.1 mg/dL — ABNORMAL LOW (ref 1.7–2.4)

## 2020-04-17 MED ORDER — SODIUM CHLORIDE 0.9 % IV SOLN
Freq: Once | INTRAVENOUS | Status: AC
  Filled 2020-04-17: qty 250

## 2020-04-17 MED ORDER — SODIUM CHLORIDE 0.9% FLUSH
10.0000 mL | INTRAVENOUS | Status: DC | PRN
  Administered 2020-04-17 (×2): 10 mL via INTRAVENOUS
  Filled 2020-04-17: qty 10

## 2020-04-17 MED ORDER — MAGNESIUM SULFATE 4 GM/100ML IV SOLN
INTRAVENOUS | Status: AC
  Filled 2020-04-17: qty 100

## 2020-04-17 MED ORDER — HEPARIN SOD (PORK) LOCK FLUSH 100 UNIT/ML IV SOLN
500.0000 [IU] | Freq: Once | INTRAVENOUS | Status: AC
  Administered 2020-04-17: 500 [IU] via INTRAVENOUS
  Filled 2020-04-17: qty 5

## 2020-04-17 MED ORDER — MAGNESIUM SULFATE 2 GM/50ML IV SOLN
INTRAVENOUS | Status: AC
  Filled 2020-04-17: qty 50

## 2020-04-17 MED ORDER — PROMETHAZINE HCL 25 MG/ML IJ SOLN
25.0000 mg | Freq: Once | INTRAMUSCULAR | Status: AC
  Administered 2020-04-17: 25 mg via INTRAVENOUS

## 2020-04-17 MED ORDER — SODIUM CHLORIDE 0.9 % IV SOLN: 6.0000 g | Freq: Once | INTRAVENOUS | Status: DC

## 2020-04-17 MED ORDER — MAGNESIUM SULFATE 4 GM/100ML IV SOLN
4.0000 g | Freq: Once | INTRAVENOUS | Status: AC
  Administered 2020-04-17: 4 g via INTRAVENOUS

## 2020-04-17 MED ORDER — MAGNESIUM SULFATE 2 GM/50ML IV SOLN
2.0000 g | Freq: Once | INTRAVENOUS | Status: AC
  Administered 2020-04-17: 2 g via INTRAVENOUS

## 2020-04-17 MED ORDER — PROMETHAZINE HCL 25 MG/ML IJ SOLN
INTRAMUSCULAR | Status: AC
  Filled 2020-04-17: qty 1

## 2020-04-19 ENCOUNTER — Ambulatory Visit: Payer: Medicare PPO | Admitting: Physical Therapy

## 2020-04-19 DIAGNOSIS — Z7682 Awaiting organ transplant status: Secondary | ICD-10-CM | POA: Diagnosis not present

## 2020-04-19 DIAGNOSIS — C9000 Multiple myeloma not having achieved remission: Secondary | ICD-10-CM | POA: Diagnosis not present

## 2020-04-19 DIAGNOSIS — D72810 Lymphocytopenia: Secondary | ICD-10-CM | POA: Diagnosis not present

## 2020-04-19 DIAGNOSIS — Z01811 Encounter for preprocedural respiratory examination: Secondary | ICD-10-CM | POA: Diagnosis not present

## 2020-04-19 DIAGNOSIS — R931 Abnormal findings on diagnostic imaging of heart and coronary circulation: Secondary | ICD-10-CM | POA: Diagnosis not present

## 2020-04-19 DIAGNOSIS — I350 Nonrheumatic aortic (valve) stenosis: Secondary | ICD-10-CM | POA: Diagnosis not present

## 2020-04-19 DIAGNOSIS — Z9481 Bone marrow transplant status: Secondary | ICD-10-CM | POA: Diagnosis not present

## 2020-04-19 DIAGNOSIS — D649 Anemia, unspecified: Secondary | ICD-10-CM | POA: Diagnosis not present

## 2020-04-19 DIAGNOSIS — R918 Other nonspecific abnormal finding of lung field: Secondary | ICD-10-CM | POA: Diagnosis not present

## 2020-04-20 ENCOUNTER — Inpatient Hospital Stay: Payer: Medicare PPO

## 2020-04-20 ENCOUNTER — Other Ambulatory Visit: Payer: Self-pay

## 2020-04-20 DIAGNOSIS — R110 Nausea: Secondary | ICD-10-CM

## 2020-04-20 DIAGNOSIS — R52 Pain, unspecified: Secondary | ICD-10-CM | POA: Diagnosis not present

## 2020-04-20 DIAGNOSIS — G629 Polyneuropathy, unspecified: Secondary | ICD-10-CM | POA: Diagnosis not present

## 2020-04-20 DIAGNOSIS — I10 Essential (primary) hypertension: Secondary | ICD-10-CM | POA: Diagnosis not present

## 2020-04-20 DIAGNOSIS — D649 Anemia, unspecified: Secondary | ICD-10-CM | POA: Diagnosis not present

## 2020-04-20 DIAGNOSIS — C9000 Multiple myeloma not having achieved remission: Secondary | ICD-10-CM

## 2020-04-20 DIAGNOSIS — D801 Nonfamilial hypogammaglobulinemia: Secondary | ICD-10-CM | POA: Diagnosis not present

## 2020-04-20 DIAGNOSIS — E119 Type 2 diabetes mellitus without complications: Secondary | ICD-10-CM | POA: Diagnosis not present

## 2020-04-20 LAB — BASIC METABOLIC PANEL
Anion gap: 10 (ref 5–15)
BUN: 23 mg/dL (ref 8–23)
CO2: 23 mmol/L (ref 22–32)
Calcium: 9.2 mg/dL (ref 8.9–10.3)
Chloride: 103 mmol/L (ref 98–111)
Creatinine, Ser: 1 mg/dL (ref 0.44–1.00)
GFR, Estimated: >60 mL/min (ref >60)
Glucose, Bld: 179 mg/dL — ABNORMAL HIGH (ref 70–99)
Potassium: 4.4 mmol/L (ref 3.5–5.1)
Sodium: 136 mmol/L (ref 135–145)

## 2020-04-20 LAB — MAGNESIUM: Magnesium: 1.4 mg/dL — ABNORMAL LOW (ref 1.7–2.4)

## 2020-04-20 MED ORDER — PROMETHAZINE HCL 25 MG/ML IJ SOLN
25.0000 mg | Freq: Once | INTRAMUSCULAR | Status: AC
  Administered 2020-04-20: 25 mg via INTRAVENOUS
  Filled 2020-04-20: qty 1

## 2020-04-20 MED ORDER — SODIUM CHLORIDE 0.9 % IV SOLN
Freq: Once | INTRAVENOUS | Status: AC
  Filled 2020-04-20: qty 250

## 2020-04-20 MED ORDER — SODIUM CHLORIDE 0.9% FLUSH
10.0000 mL | INTRAVENOUS | Status: DC | PRN
  Administered 2020-04-20: 10 mL
  Filled 2020-04-20: qty 10

## 2020-04-20 MED ORDER — MAGNESIUM SULFATE 4 GM/100ML IV SOLN
4.0000 g | Freq: Once | INTRAVENOUS | Status: AC
  Administered 2020-04-20: 4 g via INTRAVENOUS
  Filled 2020-04-20: qty 100

## 2020-04-20 MED ORDER — HEPARIN SOD (PORK) LOCK FLUSH 100 UNIT/ML IV SOLN
500.0000 [IU] | Freq: Once | INTRAVENOUS | Status: AC | PRN
  Administered 2020-04-20: 500 [IU]
  Filled 2020-04-20: qty 5

## 2020-04-22 NOTE — Progress Notes (Signed)
[facility]  [address], [address] [place], [place] [address] Phone: [phone]  Fax: [phone]   Clinic Day:  04/24/2020   Referring physician: Glean Hess, MD  Chief Complaint: Sarah [name] is a 64 y.o. 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  HPI: The patient was last seen in the medical oncology clinic on 04/10/2020.  At that time, she was doing well. She was ready to begin pre-transplant evaluation. Hematocrit was 32.1, hemoglobin 10.7, platelets 186,000, WBC 4,500. Creatinine was 1.10. Magnesium was 1.0. M spike was 0 g/dL. Kappa/lambda light chain ratio wwas normal.  She received 6 g IV magnesium. We discussed continuation of IVIG monthly.   Patient received IV magnesium on 10/14/021, 04/17/2020, and 04/20/2020.  Magnesium ranged between 1.1 - 1.4.  During the interim, the patient felt "ok". She has lower back pain from bone marrow biopsy (6/10). Her neuropathy is stable and she has occasional sharp pains in her hands and feet which is new.   Her weight is up 1 pound. She has been maintaining her weight. She is craving desserts more than usual. She continues to have nausea. Her BP is under good control today.   She will have a chest CT scan on Wednesday due to suspicion of pneumonia. She would like to continue getting Phenergan with her magnesium. She will have a transplant at [facility] on 05/08/2020.    Past Medical History:  Diagnosis Date  . Abnormal stress test 02/14/2016   Overview:  Added automatically from request for surgery [id number]  . Anemia   . Anxiety   . Arthritis   . Bicuspid aortic valve   . CHF (congestive heart failure) ([place])   . CKD (chronic kidney disease) stage 3, GFR 30-59 ml/min (HCC)   . Depression   . Diabetes mellitus ([place])   . Dizziness   . Fatty liver   . Frequent falls   . GERD (gastroesophageal reflux disease)    . Gout   . Heart murmur   . History of blood transfusion   . History of bone marrow transplant ([place])   . History of uterine fibroid   . Hx of cardiac catheterization 06/05/2016   Overview:  Normal coronaries 2017  . Hypertension   . Hypomagnesemia   . Multiple myeloma ([place])   . Personal history of chemotherapy   . Renal cyst     Past Surgical History:  Procedure Laterality Date  . ABDOMINAL HYSTERECTOMY    . 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 CARDIAC ELECTROPHYSIOLOGY MAPPING AND ABLATION    . CARPAL TUNNEL RELEASE Bilateral   . CHOLECYSTECTOMY  2008  . COLONOSCOPY WITH PROPOFOL N/A 05/07/2017   Procedure: COLONOSCOPY WITH PROPOFOL;  Surgeon: [name], MD;  Location: [facility] ENDOSCOPY;  Service: Gastroenterology;  Laterality: N/A;  . ESOPHAGOGASTRODUODENOSCOPY (EGD) WITH PROPOFOL N/A 05/07/2017   Procedure: ESOPHAGOGASTRODUODENOSCOPY (EGD) WITH PROPOFOL;  Surgeon: [name], MD;  Location: [facility] ENDOSCOPY;  Service: Gastroenterology;  Laterality: N/A;  . FOOT SURGERY Bilateral   . INCONTINENCE SURGERY  2009  . INTERSTIM IMPLANT PLACEMENT    . other     over active bladder  . OTHER SURGICAL HISTORY     bladder stimulator   . PARTIAL HYSTERECTOMY  03/1996   fibroids  . PORTA CATH INSERTION N/A 03/10/2019   Procedure: PORTA CATH INSERTION;  Surgeon: Algernon Huxley, MD;  Location: [place]  CV LAB;  Service: Cardiovascular;  Laterality: N/A;  . TONSILLECTOMY  2007    Family History  Problem Relation Age of Onset  . Colon cancer Father   . Renal Disease Father   . Diabetes Mellitus II Father   . Melanoma Paternal Grandmother   . Breast cancer Maternal Aunt 78  . Anemia Mother   . Heart disease Mother   . Heart failure Mother   . Renal Disease Mother   . Congestive Heart Failure Mother   . Heart disease Maternal Uncle   . Throat cancer Maternal Uncle   . Lung cancer Maternal Uncle   . Liver disease Maternal Uncle   . Heart failure Maternal Uncle   . Hearing loss  Son 51       Suicide     Social History:  reports that she quit smoking about 28 years ago. Her smoking use included cigarettes. She has a 20.00 pack-year smoking history. She has never used smokeless tobacco. She reports current alcohol use. She reports that she does not use drugs. Patient has not had ETOH in several months. She is on disability. She notes exposure to perchloroethylene [facility]).She lives much of her adult life in [place]. She was married with 2 sons. Her husband passed away. Her 2 sons took their own lives (age 13 and 45). She worked in [employer] in [employer]. She went back to school and earned an [facility] [employer] and works for [employer] for several years.She lives[place] sistersin [place]. She has two dogs, Harley and Noel. The patient is alone today.   Allergies:  Allergies  Allergen Reactions  . Oxycodone-Acetaminophen Anaphylaxis    Swelling and rash  . Celebrex [Celecoxib] Diarrhea  . Codeine   . Plerixafor     In 2016 during ASCT collection patient developed fever to 103.55F and required hospitalization  . Benadryl [Diphenhydramine] Palpitations  . Morphine Itching and Rash  . Ondansetron Diarrhea  . Tylenol [Acetaminophen] Itching and Rash    Current Medications: Current Outpatient Medications  Medication Sig Dispense Refill  . acetaminophen (TYLENOL) 500 MG tablet Take 500 mg by mouth every 6 (six) hours as needed.     Marland Kitchen acyclovir (ZOVIRAX) 400 MG tablet Take 1 tablet (400 mg total) by mouth 2 (two) times daily. 60 tablet 11  . allopurinol (ZYLOPRIM) 100 MG tablet TAKE 1 TABLET(100 MG) BY MOUTH DAILY as needed 90 tablet 1  . aspirin 81 MG chewable tablet Chew 1 tablet (81 mg total) by mouth daily. 30 tablet 0  . diclofenac sodium (VOLTAREN) 1 % GEL Apply 2 g topically 4 (four) times daily. 100 g 1  . diphenoxylate-atropine (LOMOTIL) 2.5-0.025 MG tablet Take 2 tablets by mouth daily as needed for diarrhea or loose stools. 45 tablet 2  .  fentaNYL (DURAGESIC - DOSED MCG/HR) 25 MCG/HR patch Place 25 mcg onto the skin every 3 (three) days.     Marland Kitchen FLUoxetine (PROZAC) 40 MG capsule TAKE 1 CAPSULE EVERY DAY 90 capsule 1  . fluticasone (FLONASE) 50 MCG/ACT nasal spray Place 2 sprays into both nostrils daily. 16 g 2  . glucose blood (ONE TOUCH ULTRA TEST) test strip     . lactulose (CHRONULAC) 10 GM/15ML solution Take 20 g by mouth daily as needed.     . lidocaine-prilocaine (EMLA) cream APPLY TOPICALLY AS NEEDED. 30 g 0  . metFORMIN (GLUCOPHAGE-XR) 500 MG 24 hr tablet TAKE 1 TABLET (500 MG TOTAL) BY MOUTH DAILY WITH BREAKFAST. 90 tablet 1  .  methocarbamol (ROBAXIN) 500 MG tablet Take 1 tablet (500 mg total) by mouth 4 (four) times daily. 120 tablet 0  . Multiple Vitamin (MULTIVITAMIN) tablet Take 1 tablet by mouth daily.    . Multiple Vitamins-Minerals (HAIR/SKIN/NAILS) TABS Take 1 tablet by mouth daily.    Marland Kitchen omeprazole (PRILOSEC) 40 MG capsule TAKE 1 CAPSULE EVERY DAY 90 capsule 3  . promethazine-dextromethorphan (PROMETHAZINE-DM) 6.25-15 MG/5ML syrup Take 5 mLs by mouth 4 (four) times daily as needed for cough. 240 mL 0  . traZODone (DESYREL) 100 MG tablet TAKE 1 TABLET AT BEDTIME  FOR  SLEEP 90 tablet 0  . dexamethasone (DECADRON) 4 MG tablet Take 5 tablets (75m) on days 2, 16 of cycle 3,4, 5 and 6 Take 5 tablets (274m on days 2 of cycles 7 and beyond (Patient not taking: Reported on 04/24/2020) 50 tablet 0  . diphenhydrAMINE (BENADRYL) 50 MG capsule Take 50 mg by mouth as needed.  (Patient not taking: Reported on 04/24/2020)    . fexofenadine (ALLEGRA) 180 MG tablet TAKE 1 TABLET(180 MG) BY MOUTH DAILY (Patient not taking: Reported on 04/24/2020) 30 tablet 5  . montelukast (SINGULAIR) 10 MG tablet Take 1 tablet by mouth on the day before treatment and for 2 days after treatment. (Patient not taking: Reported on 04/24/2020) 30 tablet 0  . promethazine (PHENERGAN) 25 MG tablet Take 1 tablet (25 mg total) by mouth every 6 (six) hours as  needed for nausea or vomiting. (Patient not taking: Reported on 04/24/2020) 90 tablet 0  . tiZANidine (ZANAFLEX) 4 MG tablet Take 1 tablet (4 mg total) by mouth every 8 (eight) hours as needed for muscle spasms. (Patient not taking: Reported on 04/10/2020) 270 tablet 1   No current facility-administered medications for this visit.   Facility-Administered Medications Ordered in Other Visits  Medication Dose Route Frequency Provider Last Rate Last Admin  . 0.9 %  sodium chloride infusion   Intravenous Continuous CoLequita AsalMD   Stopped at 01/20/20 1232  . heparin lock flush 100 unit/mL  500 Units Intravenous Once BuFaythe Casa, NP      . sodium chloride flush (NS) 0.9 % injection 10 mL  10 mL Intravenous PRN CoLequita AsalMD   10 mL at 02/07/20 0815  . sodium chloride flush (NS) 0.9 % injection 10 mL  10 mL Intracatheter PRN BrCammie SickleMD   10 mL at 02/10/20 0815  . sodium chloride flush (NS) 0.9 % injection 10 mL  10 mL Intravenous PRN BuJacquelin HawkingNP   10 mL at 03/01/20 1143    Review of Systems  Constitutional: Negative.  Negative for chills, diaphoresis, fever, malaise/fatigue and weight loss (stable).       Feels "ok".  HENT: Negative.  Negative for congestion, ear discharge, ear pain, hearing loss, nosebleeds, sinus pain, sore throat and tinnitus.   Eyes: Negative.  Negative for blurred vision.  Respiratory: Negative for cough, hemoptysis, sputum production, shortness of breath and wheezing.   Cardiovascular: Negative.  Negative for chest pain, palpitations and leg swelling.  Gastrointestinal: Positive for nausea (chronic). Negative for abdominal pain, blood in stool, constipation, diarrhea, heartburn (on Prilosec), melena and vomiting.  Genitourinary: Negative.  Negative for dysuria, frequency and urgency.  Musculoskeletal: Positive for back pain (chronic, 6/10). Negative for falls, joint pain, myalgias and neck pain.  Skin: Negative.  Negative for  itching and rash.  Neurological: Positive for sensory change (chronic numbness in fingers and toes w/ occasional sharp pain (  new)). Negative for dizziness, tingling, weakness and headaches.  Endo/Heme/Allergies: Negative for environmental allergies. Does not bruise/bleed easily.       Diabetes, well controlled.  Psychiatric/Behavioral: Negative.  Negative for depression and memory loss. The patient is not nervous/anxious and does not have insomnia.   All other systems reviewed and are negative.   Performance status (ECOG): 1  Vitals Blood pressure 129/88, pulse 98, temperature (!) 97 F (36.1 C), temperature source Tympanic, weight 145 lb 9.8 oz (66 kg), SpO2 99 %.  Physical Exam Vitals and nursing note reviewed.  Constitutional:      General: She is not in acute distress.    Appearance: She is well-developed. She is not ill-appearing, toxic-appearing or diaphoretic.     Interventions: Face mask in place.  HENT:     Head: Normocephalic and atraumatic.     Comments: Short curly blonde hair.    Mouth/Throat:     Mouth: Mucous membranes are moist.     Pharynx: Oropharynx is clear.  Eyes:     General: No scleral icterus.    Extraocular Movements: Extraocular movements intact.     Conjunctiva/sclera: Conjunctivae normal.     Pupils: Pupils are equal, round, and reactive to light.     Comments: Glasses. Brown eyes.   Cardiovascular:     Rate and Rhythm: Normal rate and regular rhythm.     Heart sounds: Normal heart sounds. No murmur heard.   Pulmonary:     Effort: Pulmonary effort is normal. No respiratory distress.     Breath sounds: Normal breath sounds. No wheezing or rales.  Chest:     Chest wall: No tenderness.  Abdominal:     General: Bowel sounds are normal. There is no distension.     Palpations: Abdomen is soft. There is no hepatomegaly, splenomegaly or mass.     Tenderness: There is no abdominal tenderness. There is no guarding or rebound.  Musculoskeletal:         General: No swelling or tenderness. Normal range of motion.     Cervical back: Normal range of motion and neck supple.     Right lower leg: No edema.     Left lower leg: No edema.     Comments: Bone marrow biopsy site healing well.  Lymphadenopathy:     Head:     Right side of head: No preauricular, posterior auricular or occipital adenopathy.     Left side of head: No preauricular, posterior auricular or occipital adenopathy.     Cervical: No cervical adenopathy.     Upper Body:     Right upper body: No supraclavicular adenopathy.     Left upper body: No supraclavicular adenopathy.  Skin:    General: Skin is warm and dry.     Coloration: Skin is not jaundiced or pale.     Findings: No erythema.  Neurological:     Mental Status: She is alert and oriented to person, place, and time. Mental status is at baseline.  Psychiatric:        Behavior: Behavior normal.        Thought Content: Thought content normal.        Judgment: Judgment normal.    Appointment on 04/24/2020  Component Date Value Ref Range Status  . WBC 04/24/2020 5.3  4.0 - 10.5 K/uL Final  . RBC 04/24/2020 3.70* 3.87 - 5.11 MIL/uL Final  . Hemoglobin 04/24/2020 10.7* 12.0 - 15.0 g/dL Final  . HCT 04/24/2020 32.0* 36 - 46 %  Final  . MCV 04/24/2020 86.5  80.0 - 100.0 fL Final  . MCH 04/24/2020 28.9  26.0 - 34.0 pg Final  . MCHC 04/24/2020 33.4  30.0 - 36.0 g/dL Final  . RDW 04/24/2020 14.1  11.5 - 15.5 % Final  . Platelets 04/24/2020 138* 150 - 400 K/uL Final  . nRBC 04/24/2020 0.0  0.0 - 0.2 % Final  . Neutrophils Relative % 04/24/2020 64  % Final  . Neutro Abs 04/24/2020 3.4  1.7 - 7.7 K/uL Final  . Lymphocytes Relative 04/24/2020 23  % Final  . Lymphs Abs 04/24/2020 1.3  0.7 - 4.0 K/uL Final  . Monocytes Relative 04/24/2020 9  % Final  . Monocytes Absolute 04/24/2020 0.5  0.1 - 1.0 K/uL Final  . Eosinophils Relative 04/24/2020 3  % Final  . Eosinophils Absolute 04/24/2020 0.2  0.0 - 0.5 K/uL Final  . Basophils  Relative 04/24/2020 1  % Final  . Basophils Absolute 04/24/2020 0.0  0.0 - 0.1 K/uL Final  . Immature Granulocytes 04/24/2020 0  % Final  . Abs Immature Granulocytes 04/24/2020 0.02  0.00 - 0.07 K/uL Final   Performed at [facility], [address], [place], [place] [address]    Assessment:  Sarah [name] is a 64 y.o. 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 Bone marrow revealed 80% plasma cells. Lambda free light chains were 1340. She had nephrotic range proteinuria. She initially underwent induction with RVD. Revlimid maintenance was discontinued on 01/21/2017 secondary to intolerance.   Bone marrowaspirate andbiopsyon 01/18/2021revealed anormocellularmarrow withbut increased lambda-restricted plasma cells (9% aspirate, 40% CD138 immunohistochemistry).Findingswereconsistent with recurrent plasma cell myeloma.Flow cytometry revealed no monoclonal B-cell or phenotypically aberrant T-cell population. Cytogeneticswere 18, XX (normal). 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  M-spikehas been followed: 0 on 04/02/2016 -06/23/2019; 0.2 on 09/02/2017, 0.1 on 11/25/2019, 0.1 on 01/10/2020, 0.2 on 02/07/2020, 0.1 on 03/07/2020, and 0 on 04/10/2020.  Lambda light chainshave been followed: 22.2 (ratio 0.56) on 07/03/2017, 30.8 (ratio 0.78) on 09/02/2017, 36.9 (ratio 0.40) on 10/21/2017, 37.4 (ratio 0.41) on 12/16/2017, 70.7(ratio 0.31) on 02/17/2018, 64.2 (ratio 0.27) on 04/07/2018, 78.9 (ratio 0.18) on 05/26/2018, 128.8 (ratio 0.17) on 08/06/2018, 181.5 (ratio 0.13) on 10/08/2018, 130.9 (ratio 0.13) on 10/20/2018, 160.7 (ratio 0.10)on 12/09/2018, 236.6 (ratio 0.07) on 02/01/2019, 363.6 (ratio 0.04) on 03/22/2019, 404.8 (ratio 0.04) on 04/05/2019, 420.7 (ratio 0.03) on 05/24/2019, 573.4 (ratio 0.03) on 06/23/2019, 451.05(ratio 0.02) on02/19/2021,  47.2 (ratio 0.13) on 10/25/2019, 22.4 (ratio 0.21) on 11/25/2019, 16.5 (ratio 0.33) on 01/10/2020, 14.6 (ratio 0.34) on 02/07/2020, 13.1 (ratio 0.31) on 03/07/2020, and 10.1 (ratio 0.38) on 04/10/2020.  24 hour UPEPon 06/03/2019 revealed kappa free light chains95.76,lambda free light chains1,260.71, andratio 0.08.24 hourUPEPon 02/22/2021revealed totalproteinof737m/24 hrs withlambdafree light chains 1,084.152mL andratioof0.10(1.03-31.76). M spike inurinewas46.1%(36195m4 hrs).  Bone surveyon 04/08/2016 and11/28/2018 revealed no definite lytic lesion seen in the visualized skeleton.Bone surveyon 11/19/2018 revealed no suspicious lucent lesionsand no acute bony abnormality.PET scanon 07/12/2019 revealed no focal metabolic activity to suggest active myeloma within the skeleton. There wereno lytic lesions identified on the CT portion of the examorsoft tissue plasmacytomas. There was no evidence of multiple myeloma.  Pretreatment RBC phenotypeon 09/23/2019 waspositivefor C, e, DUFFY B, KIDD B, M, S, and s antigen; negativefor c, E, KELL, DUFFY A, KIDD A, and N antigen.  Shereceived 6 cycles of daratumumab and hyaluronidase-fihj, Pomalyst, and Decadron (DPd)(09/27/2019 - 10/25/2019; 12/09/2019 - 03/13/2020).  Cycle #1 was complicated by fever and neutropenia requiring admission.  Cycle #2 was complicated by pneumonia requiring admission.  Cycle #6 was complicated with an ER evaluation for an elevated lactic acid.  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 Zometa was discontinued in 01/2017. She has chronic nauseaon Phenergan.  She has B12 deficiency. B12 was 254 on 04/09/2017, 295 on 08/20/2018, and 391 on 10/08/2018. Shewasonoral B12.She received B12 monthly (last10/05/2020).Folate was12.1on 01/10/2020.  She has iron deficiency. Ferritin was 32 on 07/01/2019. She received Venoferon 07/15/2019 and  07/22/2019.  She has hypogammaglobulinemia.  IgG was 245 on 11/25/2019.  She received monthly IVIG (07/22/021 - 03/22/2020).  She received IVIG 400 mg/kg on 01/20/2020, 200 mg/kg on 02/17/2020, and 300 mg/kg on 03/22/2020.  IVIG on 33/35/4562 was complicated by acute renal failure. IgG trough level was 418 on 02/17/2020.  She was admitted to [facility] 10/15/2019 - 10/20/2019 with fever and neutropenia. Cultures were negative. CXR was negative. She received broad spectrum antibiotics and daily Granix. She received IVF for acute renal insufficiency due to diarrhea and dehydration. Creatine was 1.66 on admission and 1.12 on discharge.  She was admitted to [facility] from 03/01/2020 - 03/05/2020 with fever and neutropenia. CXR revealed no active cardiopulmonary disease. Chest CT with contrast revealed no acute intrathoracic pathology. There were findings which could be suggestive of prior granulomatous disease. She was treated with Cefepime and Vancomycin, then switched to ciprofloxacin on 03/03/2020.   She received the [place] 09/20/2019 and 10/09/2019.  Symptomatically, she feels "ok". She has some pain at her bone marrow biopsy site (6/10). Her neuropathy is stable and she has occasional sharp pains in her hands and feet.  Exam is stable.  Plan: 1.   Labs today: CBC with diff, BMP, IgG level, Mg. 2. Stage III multiple myeloma  Symptomatically, she continues to do well.   Lambda free light chains have improved from451.05on02/19/2021. After cycle #1: 47.2 (ratio 0.13). After cycle #6:10.1 (ratio 0.38). Chemotherapy is complete.  She is being scheduled for stem cell transplant on 05/08/2020.  Discuss symptom management.  She has antiemetics and pain medications at home to use on a prn bases.  Interventions are adequate.    3.   Normocytic anemia  Hematocrit 32.0. Hemoglobin 10.7. MCV 86.5  today.   Ferritin51 on 01/10/2020. TSH was normal on02/20/2020.  Creatinine1.12 today.  Continue to monitor. 4. Grade II peripheral neuropathy  Neuropathy remains stable.  Continue to monitor. 5.   Hypogammaglobulinemia             She has had recurrent infections.               Shereceived her first dose ofIVIG(25 gm)on 01/20/2020 with subsequent renal insufficiency. Shereceived her second dose of IVIG(15 gm)on 08/19/2021without renal insufficiency.   She received her third dose of IVIG (20 gm) on 03/22/2020 without renal insufficiency. Discussed plan for fourth dose of IVIG today.   Plan for slow infusion with pre and post hydration.   Dose remains at 20 gm. Goal IgGtrough level is 500.    Trough IgG was 430 today.             Anticipate continuation of IVIG post transplant . 6.   Chronic hypomagnesemia Magnesium1.0 today. Magnesium 6 gm IV today.  Continue Mg checks twice weekly. 7.   Magnesium 6 gm IV today.  Phenergan IV today.  8.   IVIG 20 gm IV today. 9.    500 cc IV  over 2 hours. 10.   RTC tomorrow for IVF (NS 500 cc over 2 hours). 11.   RTC on Mondays and Thursdays x 4 for labs (BMP, Mg) and IV Mg and Phenergan. 12.   RTC for MD assessment after bone marrow transplant (patient to call).  I discussed the assessment and treatment plan with the patient.  The patient was provided an opportunity to ask questions and all were answered.  The patient agreed with the plan and demonstrated an understanding of the instructions.  The patient was advised to call back if the symptoms worsen or if the condition fails to improve as anticipated.    Lequita Asal, MD, PhD    04/24/2020, 8:38 AM   I, Selena Batten, am acting as scribe for [employer]. Mike Gip, MD, PhD.  I, Searcy Miyoshi C. Mike Gip, MD, have reviewed the above documentation for accuracy and  completeness, and I agree with the above.

## 2020-04-23 ENCOUNTER — Other Ambulatory Visit: Payer: Self-pay | Admitting: Hematology and Oncology

## 2020-04-23 DIAGNOSIS — C9000 Multiple myeloma not having achieved remission: Secondary | ICD-10-CM

## 2020-04-23 DIAGNOSIS — D649 Anemia, unspecified: Secondary | ICD-10-CM

## 2020-04-24 ENCOUNTER — Other Ambulatory Visit: Payer: Self-pay

## 2020-04-24 ENCOUNTER — Encounter: Payer: Self-pay | Admitting: Hematology and Oncology

## 2020-04-24 ENCOUNTER — Inpatient Hospital Stay: Payer: Medicare PPO | Admitting: Hematology and Oncology

## 2020-04-24 ENCOUNTER — Inpatient Hospital Stay: Payer: Medicare PPO

## 2020-04-24 ENCOUNTER — Ambulatory Visit: Payer: Medicare PPO

## 2020-04-24 VITALS — BP 129/69 | HR 94 | Temp 97.0°F | Resp 18

## 2020-04-24 VITALS — BP 129/88 | HR 98 | Temp 97.0°F | Wt 145.6 lb

## 2020-04-24 DIAGNOSIS — R110 Nausea: Secondary | ICD-10-CM

## 2020-04-24 DIAGNOSIS — D649 Anemia, unspecified: Secondary | ICD-10-CM | POA: Diagnosis not present

## 2020-04-24 DIAGNOSIS — C9000 Multiple myeloma not having achieved remission: Secondary | ICD-10-CM

## 2020-04-24 DIAGNOSIS — G629 Polyneuropathy, unspecified: Secondary | ICD-10-CM | POA: Diagnosis not present

## 2020-04-24 DIAGNOSIS — D801 Nonfamilial hypogammaglobulinemia: Secondary | ICD-10-CM | POA: Diagnosis not present

## 2020-04-24 DIAGNOSIS — E119 Type 2 diabetes mellitus without complications: Secondary | ICD-10-CM | POA: Diagnosis not present

## 2020-04-24 DIAGNOSIS — Z7189 Other specified counseling: Secondary | ICD-10-CM

## 2020-04-24 DIAGNOSIS — I10 Essential (primary) hypertension: Secondary | ICD-10-CM | POA: Diagnosis not present

## 2020-04-24 DIAGNOSIS — E538 Deficiency of other specified B group vitamins: Secondary | ICD-10-CM | POA: Diagnosis not present

## 2020-04-24 DIAGNOSIS — R52 Pain, unspecified: Secondary | ICD-10-CM | POA: Diagnosis not present

## 2020-04-24 LAB — BASIC METABOLIC PANEL
Anion gap: 11 (ref 5–15)
BUN: 21 mg/dL (ref 8–23)
CO2: 25 mmol/L (ref 22–32)
Calcium: 9.5 mg/dL (ref 8.9–10.3)
Chloride: 102 mmol/L (ref 98–111)
Creatinine, Ser: 1.12 mg/dL — ABNORMAL HIGH (ref 0.44–1.00)
GFR, Estimated: 55 mL/min — ABNORMAL LOW (ref >60)
Glucose, Bld: 147 mg/dL — ABNORMAL HIGH (ref 70–99)
Potassium: 4 mmol/L (ref 3.5–5.1)
Sodium: 138 mmol/L (ref 135–145)

## 2020-04-24 LAB — MAGNESIUM: Magnesium: 1 mg/dL — ABNORMAL LOW (ref 1.7–2.4)

## 2020-04-24 LAB — CBC WITH DIFFERENTIAL/PLATELET
Abs Immature Granulocytes: 0.02 10*3/uL (ref 0.00–0.07)
Basophils Absolute: 0 10*3/uL (ref 0.0–0.1)
Basophils Relative: 1 %
Eosinophils Absolute: 0.2 10*3/uL (ref 0.0–0.5)
Eosinophils Relative: 3 %
HCT: 32 % — ABNORMAL LOW (ref 36.0–46.0)
Hemoglobin: 10.7 g/dL — ABNORMAL LOW (ref 12.0–15.0)
Immature Granulocytes: 0 %
Lymphocytes Relative: 23 %
Lymphs Abs: 1.3 10*3/uL (ref 0.7–4.0)
MCH: 28.9 pg (ref 26.0–34.0)
MCHC: 33.4 g/dL (ref 30.0–36.0)
MCV: 86.5 fL (ref 80.0–100.0)
Monocytes Absolute: 0.5 10*3/uL (ref 0.1–1.0)
Monocytes Relative: 9 %
Neutro Abs: 3.4 10*3/uL (ref 1.7–7.7)
Neutrophils Relative %: 64 %
Platelets: 138 10*3/uL — ABNORMAL LOW (ref 150–400)
RBC: 3.7 MIL/uL — ABNORMAL LOW (ref 3.87–5.11)
RDW: 14.1 % (ref 11.5–15.5)
WBC: 5.3 10*3/uL (ref 4.0–10.5)
nRBC: 0 % (ref 0.0–0.2)

## 2020-04-24 MED ORDER — IMMUNE GLOBULIN (HUMAN) 10 GM/100ML IV SOLN
312.0000 mg/kg | Freq: Once | INTRAVENOUS | Status: AC
  Administered 2020-04-24: 20 g via INTRAVENOUS
  Filled 2020-04-24: qty 200

## 2020-04-24 MED ORDER — ACETAMINOPHEN 325 MG PO TABS
650.0000 mg | ORAL_TABLET | ORAL | Status: DC
  Administered 2020-04-24: 650 mg via ORAL
  Filled 2020-04-24: qty 2

## 2020-04-24 MED ORDER — PROMETHAZINE HCL 25 MG/ML IJ SOLN
25.0000 mg | Freq: Four times a day (QID) | INTRAMUSCULAR | Status: DC | PRN
  Administered 2020-04-24: 25 mg via INTRAVENOUS
  Filled 2020-04-24: qty 1

## 2020-04-24 MED ORDER — SODIUM CHLORIDE 0.9 % IV SOLN
Freq: Once | INTRAVENOUS | Status: AC
  Filled 2020-04-24: qty 250

## 2020-04-24 MED ORDER — DIPHENHYDRAMINE HCL 25 MG PO CAPS
25.0000 mg | ORAL_CAPSULE | ORAL | Status: AC
  Administered 2020-04-24: 25 mg via ORAL
  Filled 2020-04-24: qty 1

## 2020-04-24 MED ORDER — SODIUM CHLORIDE 0.9% FLUSH
10.0000 mL | INTRAVENOUS | Status: DC | PRN
  Administered 2020-04-24: 10 mL
  Filled 2020-04-24: qty 10

## 2020-04-24 MED ORDER — DEXTROSE 5 % IV SOLN
Freq: Once | INTRAVENOUS | Status: AC
  Filled 2020-04-24: qty 250

## 2020-04-24 MED ORDER — SODIUM CHLORIDE 0.9 % IV SOLN
6.0000 g | Freq: Once | INTRAVENOUS | Status: AC
  Administered 2020-04-24: 6 g via INTRAVENOUS
  Filled 2020-04-24: qty 12

## 2020-04-24 MED ORDER — HEPARIN SOD (PORK) LOCK FLUSH 100 UNIT/ML IV SOLN
500.0000 [IU] | Freq: Once | INTRAVENOUS | Status: AC | PRN
  Administered 2020-04-24: 500 [IU]
  Filled 2020-04-24: qty 5

## 2020-04-24 NOTE — Progress Notes (Signed)
Patient c/o pain noted to her lower back from bone marrow bx (6)

## 2020-04-25 ENCOUNTER — Inpatient Hospital Stay: Payer: Medicare PPO

## 2020-04-25 DIAGNOSIS — C9000 Multiple myeloma not having achieved remission: Secondary | ICD-10-CM | POA: Diagnosis not present

## 2020-04-25 DIAGNOSIS — I10 Essential (primary) hypertension: Secondary | ICD-10-CM | POA: Diagnosis not present

## 2020-04-25 DIAGNOSIS — D801 Nonfamilial hypogammaglobulinemia: Secondary | ICD-10-CM | POA: Diagnosis not present

## 2020-04-25 DIAGNOSIS — G629 Polyneuropathy, unspecified: Secondary | ICD-10-CM | POA: Diagnosis not present

## 2020-04-25 DIAGNOSIS — R110 Nausea: Secondary | ICD-10-CM | POA: Diagnosis not present

## 2020-04-25 DIAGNOSIS — D649 Anemia, unspecified: Secondary | ICD-10-CM | POA: Diagnosis not present

## 2020-04-25 DIAGNOSIS — R52 Pain, unspecified: Secondary | ICD-10-CM | POA: Diagnosis not present

## 2020-04-25 DIAGNOSIS — E119 Type 2 diabetes mellitus without complications: Secondary | ICD-10-CM | POA: Diagnosis not present

## 2020-04-25 LAB — IGG: IgG (Immunoglobin G), Serum: 430 mg/dL — ABNORMAL LOW (ref 586–1602)

## 2020-04-25 MED ORDER — SODIUM CHLORIDE 0.9 % IV SOLN
Freq: Once | INTRAVENOUS | Status: AC
  Filled 2020-04-25: qty 250

## 2020-04-25 MED ORDER — HEPARIN SOD (PORK) LOCK FLUSH 100 UNIT/ML IV SOLN
500.0000 [IU] | Freq: Once | INTRAVENOUS | Status: AC
  Administered 2020-04-25: 500 [IU] via INTRAVENOUS
  Filled 2020-04-25: qty 5

## 2020-04-26 ENCOUNTER — Ambulatory Visit: Payer: Medicare PPO | Admitting: Physical Therapy

## 2020-04-26 DIAGNOSIS — Z7682 Awaiting organ transplant status: Secondary | ICD-10-CM | POA: Diagnosis not present

## 2020-04-26 DIAGNOSIS — I350 Nonrheumatic aortic (valve) stenosis: Secondary | ICD-10-CM | POA: Diagnosis not present

## 2020-04-26 DIAGNOSIS — Q231 Congenital insufficiency of aortic valve: Secondary | ICD-10-CM | POA: Diagnosis not present

## 2020-04-26 DIAGNOSIS — I2510 Atherosclerotic heart disease of native coronary artery without angina pectoris: Secondary | ICD-10-CM | POA: Diagnosis not present

## 2020-04-26 DIAGNOSIS — I6523 Occlusion and stenosis of bilateral carotid arteries: Secondary | ICD-10-CM | POA: Diagnosis not present

## 2020-04-26 DIAGNOSIS — R911 Solitary pulmonary nodule: Secondary | ICD-10-CM | POA: Diagnosis not present

## 2020-04-26 DIAGNOSIS — I351 Nonrheumatic aortic (valve) insufficiency: Secondary | ICD-10-CM | POA: Diagnosis not present

## 2020-04-27 ENCOUNTER — Inpatient Hospital Stay: Payer: Medicare PPO

## 2020-04-27 ENCOUNTER — Other Ambulatory Visit: Payer: Self-pay

## 2020-04-27 VITALS — BP 138/90 | HR 96 | Resp 18

## 2020-04-27 DIAGNOSIS — R52 Pain, unspecified: Secondary | ICD-10-CM | POA: Diagnosis not present

## 2020-04-27 DIAGNOSIS — E119 Type 2 diabetes mellitus without complications: Secondary | ICD-10-CM | POA: Diagnosis not present

## 2020-04-27 DIAGNOSIS — D649 Anemia, unspecified: Secondary | ICD-10-CM | POA: Diagnosis not present

## 2020-04-27 DIAGNOSIS — I10 Essential (primary) hypertension: Secondary | ICD-10-CM | POA: Diagnosis not present

## 2020-04-27 DIAGNOSIS — R110 Nausea: Secondary | ICD-10-CM

## 2020-04-27 DIAGNOSIS — C9000 Multiple myeloma not having achieved remission: Secondary | ICD-10-CM | POA: Diagnosis not present

## 2020-04-27 DIAGNOSIS — D801 Nonfamilial hypogammaglobulinemia: Secondary | ICD-10-CM | POA: Diagnosis not present

## 2020-04-27 DIAGNOSIS — G629 Polyneuropathy, unspecified: Secondary | ICD-10-CM | POA: Diagnosis not present

## 2020-04-27 LAB — BASIC METABOLIC PANEL
Anion gap: 11 (ref 5–15)
BUN: 17 mg/dL (ref 8–23)
CO2: 23 mmol/L (ref 22–32)
Calcium: 8.8 mg/dL — ABNORMAL LOW (ref 8.9–10.3)
Chloride: 102 mmol/L (ref 98–111)
Creatinine, Ser: 0.97 mg/dL (ref 0.44–1.00)
GFR, Estimated: >60 mL/min (ref >60)
Glucose, Bld: 142 mg/dL — ABNORMAL HIGH (ref 70–99)
Potassium: 4.1 mmol/L (ref 3.5–5.1)
Sodium: 136 mmol/L (ref 135–145)

## 2020-04-27 LAB — MAGNESIUM: Magnesium: 1.1 mg/dL — ABNORMAL LOW (ref 1.7–2.4)

## 2020-04-27 MED ORDER — MAGNESIUM SULFATE 2 GM/50ML IV SOLN
2.0000 g | Freq: Once | INTRAVENOUS | Status: AC
  Administered 2020-04-27: 2 g via INTRAVENOUS
  Filled 2020-04-27: qty 50

## 2020-04-27 MED ORDER — HEPARIN SOD (PORK) LOCK FLUSH 100 UNIT/ML IV SOLN
500.0000 [IU] | Freq: Once | INTRAVENOUS | Status: AC | PRN
  Administered 2020-04-27: 500 [IU]
  Filled 2020-04-27: qty 5

## 2020-04-27 MED ORDER — SODIUM CHLORIDE 0.9 % IV SOLN
INTRAVENOUS | Status: DC
  Filled 2020-04-27: qty 250

## 2020-04-27 MED ORDER — MAGNESIUM SULFATE 4 GM/100ML IV SOLN
4.0000 g | Freq: Once | INTRAVENOUS | Status: AC
  Administered 2020-04-27: 4 g via INTRAVENOUS
  Filled 2020-04-27: qty 100

## 2020-04-27 MED ORDER — PROMETHAZINE HCL 25 MG/ML IJ SOLN
25.0000 mg | Freq: Four times a day (QID) | INTRAMUSCULAR | Status: DC | PRN
  Administered 2020-04-27: 25 mg via INTRAVENOUS
  Filled 2020-04-27: qty 1

## 2020-04-27 MED ORDER — SODIUM CHLORIDE 0.9 % IV SOLN: 6.0000 g | Freq: Once | INTRAVENOUS | Status: DC

## 2020-04-27 NOTE — Progress Notes (Signed)
0 pt discharged home, condition stable, port deaccessed prior to discharge.

## 2020-05-01 ENCOUNTER — Inpatient Hospital Stay: Payer: Medicare PPO

## 2020-05-01 ENCOUNTER — Other Ambulatory Visit: Payer: Self-pay

## 2020-05-01 ENCOUNTER — Inpatient Hospital Stay: Payer: Medicare PPO | Attending: Hematology and Oncology

## 2020-05-01 VITALS — BP 125/84 | HR 95 | Temp 98.1°F | Resp 18

## 2020-05-01 DIAGNOSIS — Z803 Family history of malignant neoplasm of breast: Secondary | ICD-10-CM | POA: Insufficient documentation

## 2020-05-01 DIAGNOSIS — R110 Nausea: Secondary | ICD-10-CM

## 2020-05-01 DIAGNOSIS — Z800 Family history of malignant neoplasm of digestive organs: Secondary | ICD-10-CM | POA: Diagnosis not present

## 2020-05-01 DIAGNOSIS — C9000 Multiple myeloma not having achieved remission: Secondary | ICD-10-CM

## 2020-05-01 DIAGNOSIS — Z8379 Family history of other diseases of the digestive system: Secondary | ICD-10-CM | POA: Insufficient documentation

## 2020-05-01 DIAGNOSIS — E119 Type 2 diabetes mellitus without complications: Secondary | ICD-10-CM | POA: Diagnosis not present

## 2020-05-01 DIAGNOSIS — D649 Anemia, unspecified: Secondary | ICD-10-CM | POA: Insufficient documentation

## 2020-05-01 DIAGNOSIS — D801 Nonfamilial hypogammaglobulinemia: Secondary | ICD-10-CM | POA: Diagnosis not present

## 2020-05-01 DIAGNOSIS — Z832 Family history of diseases of the blood and blood-forming organs and certain disorders involving the immune mechanism: Secondary | ICD-10-CM | POA: Diagnosis not present

## 2020-05-01 DIAGNOSIS — Z79899 Other long term (current) drug therapy: Secondary | ICD-10-CM | POA: Insufficient documentation

## 2020-05-01 DIAGNOSIS — Z8249 Family history of ischemic heart disease and other diseases of the circulatory system: Secondary | ICD-10-CM | POA: Diagnosis not present

## 2020-05-01 DIAGNOSIS — Z808 Family history of malignant neoplasm of other organs or systems: Secondary | ICD-10-CM | POA: Insufficient documentation

## 2020-05-01 DIAGNOSIS — G629 Polyneuropathy, unspecified: Secondary | ICD-10-CM | POA: Insufficient documentation

## 2020-05-01 DIAGNOSIS — Z822 Family history of deafness and hearing loss: Secondary | ICD-10-CM | POA: Insufficient documentation

## 2020-05-01 DIAGNOSIS — Z833 Family history of diabetes mellitus: Secondary | ICD-10-CM | POA: Insufficient documentation

## 2020-05-01 DIAGNOSIS — Z841 Family history of disorders of kidney and ureter: Secondary | ICD-10-CM | POA: Diagnosis not present

## 2020-05-01 LAB — BASIC METABOLIC PANEL
Anion gap: 12 (ref 5–15)
BUN: 28 mg/dL — ABNORMAL HIGH (ref 8–23)
CO2: 23 mmol/L (ref 22–32)
Calcium: 9.4 mg/dL (ref 8.9–10.3)
Chloride: 100 mmol/L (ref 98–111)
Creatinine, Ser: 1.07 mg/dL — ABNORMAL HIGH (ref 0.44–1.00)
GFR, Estimated: 58 mL/min — ABNORMAL LOW (ref >60)
Glucose, Bld: 143 mg/dL — ABNORMAL HIGH (ref 70–99)
Potassium: 4 mmol/L (ref 3.5–5.1)
Sodium: 135 mmol/L (ref 135–145)

## 2020-05-01 LAB — MAGNESIUM: Magnesium: 1.2 mg/dL — ABNORMAL LOW (ref 1.7–2.4)

## 2020-05-01 MED ORDER — MAGNESIUM SULFATE 2 GM/50ML IV SOLN
2.0000 g | Freq: Once | INTRAVENOUS | Status: AC
  Administered 2020-05-01: 2 g via INTRAVENOUS
  Filled 2020-05-01: qty 50

## 2020-05-01 MED ORDER — SODIUM CHLORIDE 0.9% FLUSH
10.0000 mL | Freq: Once | INTRAVENOUS | Status: DC | PRN
  Filled 2020-05-01: qty 10

## 2020-05-01 MED ORDER — MAGNESIUM SULFATE 4 GM/100ML IV SOLN
4.0000 g | Freq: Once | INTRAVENOUS | Status: AC
  Administered 2020-05-01: 4 g via INTRAVENOUS
  Filled 2020-05-01: qty 100

## 2020-05-01 MED ORDER — PROMETHAZINE HCL 25 MG/ML IJ SOLN
25.0000 mg | Freq: Once | INTRAMUSCULAR | Status: AC
  Administered 2020-05-01: 25 mg via INTRAVENOUS
  Filled 2020-05-01: qty 1

## 2020-05-01 MED ORDER — HEPARIN SOD (PORK) LOCK FLUSH 100 UNIT/ML IV SOLN
500.0000 [IU] | Freq: Once | INTRAVENOUS | Status: AC | PRN
  Administered 2020-05-01: 500 [IU]
  Filled 2020-05-01: qty 5

## 2020-05-01 MED ORDER — SODIUM CHLORIDE 0.9 % IV SOLN: 6.0000 g | Freq: Once | INTRAVENOUS | Status: DC

## 2020-05-01 MED ORDER — SODIUM CHLORIDE 0.9 % IV SOLN
Freq: Once | INTRAVENOUS | Status: AC
  Filled 2020-05-01: qty 250

## 2020-05-01 NOTE — Progress Notes (Signed)
Patient received prescribed treatment in clinic as ordered; tolerated well. Patient discharged in stable condition.

## 2020-05-02 DIAGNOSIS — C9001 Multiple myeloma in remission: Secondary | ICD-10-CM | POA: Diagnosis not present

## 2020-05-03 ENCOUNTER — Inpatient Hospital Stay: Payer: Medicare PPO

## 2020-05-03 ENCOUNTER — Other Ambulatory Visit: Payer: Self-pay

## 2020-05-03 VITALS — BP 127/76 | HR 91 | Temp 98.3°F | Resp 18

## 2020-05-03 DIAGNOSIS — C9000 Multiple myeloma not having achieved remission: Secondary | ICD-10-CM

## 2020-05-03 DIAGNOSIS — Z841 Family history of disorders of kidney and ureter: Secondary | ICD-10-CM | POA: Diagnosis not present

## 2020-05-03 DIAGNOSIS — Z79899 Other long term (current) drug therapy: Secondary | ICD-10-CM | POA: Diagnosis not present

## 2020-05-03 DIAGNOSIS — R110 Nausea: Secondary | ICD-10-CM

## 2020-05-03 DIAGNOSIS — E119 Type 2 diabetes mellitus without complications: Secondary | ICD-10-CM | POA: Diagnosis not present

## 2020-05-03 DIAGNOSIS — Z800 Family history of malignant neoplasm of digestive organs: Secondary | ICD-10-CM | POA: Diagnosis not present

## 2020-05-03 DIAGNOSIS — D801 Nonfamilial hypogammaglobulinemia: Secondary | ICD-10-CM | POA: Diagnosis not present

## 2020-05-03 DIAGNOSIS — D649 Anemia, unspecified: Secondary | ICD-10-CM | POA: Diagnosis not present

## 2020-05-03 DIAGNOSIS — G629 Polyneuropathy, unspecified: Secondary | ICD-10-CM | POA: Diagnosis not present

## 2020-05-03 LAB — BASIC METABOLIC PANEL
Anion gap: 11 (ref 5–15)
BUN: 35 mg/dL — ABNORMAL HIGH (ref 8–23)
CO2: 24 mmol/L (ref 22–32)
Calcium: 9.2 mg/dL (ref 8.9–10.3)
Chloride: 100 mmol/L (ref 98–111)
Creatinine, Ser: 1.11 mg/dL — ABNORMAL HIGH (ref 0.44–1.00)
GFR, Estimated: 56 mL/min — ABNORMAL LOW (ref >60)
Glucose, Bld: 175 mg/dL — ABNORMAL HIGH (ref 70–99)
Potassium: 4.2 mmol/L (ref 3.5–5.1)
Sodium: 135 mmol/L (ref 135–145)

## 2020-05-03 LAB — MAGNESIUM: Magnesium: 1.3 mg/dL — ABNORMAL LOW (ref 1.7–2.4)

## 2020-05-03 MED ORDER — HEPARIN SOD (PORK) LOCK FLUSH 100 UNIT/ML IV SOLN
500.0000 [IU] | Freq: Once | INTRAVENOUS | Status: AC | PRN
  Administered 2020-05-03: 500 [IU]
  Filled 2020-05-03: qty 5

## 2020-05-03 MED ORDER — PROMETHAZINE HCL 25 MG PO TABS: 25.0000 mg | ORAL_TABLET | Freq: Four times a day (QID) | ORAL | 0 refills | Status: DC | PRN

## 2020-05-03 MED ORDER — SODIUM CHLORIDE 0.9 % IV SOLN
Freq: Once | INTRAVENOUS | Status: AC
  Filled 2020-05-03: qty 250

## 2020-05-03 MED ORDER — MAGNESIUM SULFATE 2 GM/50ML IV SOLN
2.0000 g | Freq: Once | INTRAVENOUS | Status: AC
  Administered 2020-05-03: 2 g via INTRAVENOUS
  Filled 2020-05-03: qty 50

## 2020-05-03 MED ORDER — SODIUM CHLORIDE 0.9% FLUSH
10.0000 mL | Freq: Once | INTRAVENOUS | Status: AC | PRN
  Administered 2020-05-03: 10 mL
  Filled 2020-05-03: qty 10

## 2020-05-03 MED ORDER — MAGNESIUM SULFATE 4 GM/100ML IV SOLN
4.0000 g | Freq: Once | INTRAVENOUS | Status: AC
  Administered 2020-05-03: 4 g via INTRAVENOUS
  Filled 2020-05-03: qty 100

## 2020-05-03 MED ORDER — SODIUM CHLORIDE 0.9 % IV SOLN
6.0000 g | Freq: Once | INTRAVENOUS | Status: DC
  Filled 2020-05-03: qty 12

## 2020-05-03 MED ORDER — PROMETHAZINE HCL 25 MG/ML IJ SOLN
25.0000 mg | Freq: Once | INTRAMUSCULAR | Status: AC
  Administered 2020-05-03: 25 mg via INTRAVENOUS
  Filled 2020-05-03: qty 1

## 2020-05-03 NOTE — Progress Notes (Signed)
Patient received 6 grams IV magnesium, 25 mg IV phenergan and 500 cc NS over 3 hours today in clinic.Patient tolerated well. Patient discharged in stable condition.

## 2020-05-04 ENCOUNTER — Inpatient Hospital Stay: Payer: Medicare PPO

## 2020-05-04 DIAGNOSIS — C9001 Multiple myeloma in remission: Secondary | ICD-10-CM | POA: Diagnosis not present

## 2020-05-04 DIAGNOSIS — Z9484 Stem cells transplant status: Secondary | ICD-10-CM | POA: Diagnosis not present

## 2020-05-04 DIAGNOSIS — Z7682 Awaiting organ transplant status: Secondary | ICD-10-CM | POA: Diagnosis not present

## 2020-05-04 DIAGNOSIS — D709 Neutropenia, unspecified: Secondary | ICD-10-CM | POA: Diagnosis not present

## 2020-05-04 DIAGNOSIS — Z9481 Bone marrow transplant status: Secondary | ICD-10-CM | POA: Diagnosis not present

## 2020-05-04 DIAGNOSIS — Z452 Encounter for adjustment and management of vascular access device: Secondary | ICD-10-CM | POA: Diagnosis not present

## 2020-05-04 DIAGNOSIS — C9000 Multiple myeloma not having achieved remission: Secondary | ICD-10-CM | POA: Diagnosis not present

## 2020-05-04 DIAGNOSIS — N179 Acute kidney failure, unspecified: Secondary | ICD-10-CM | POA: Diagnosis not present

## 2020-05-04 DIAGNOSIS — Z885 Allergy status to narcotic agent status: Secondary | ICD-10-CM | POA: Diagnosis not present

## 2020-05-05 DIAGNOSIS — Z01812 Encounter for preprocedural laboratory examination: Secondary | ICD-10-CM | POA: Diagnosis not present

## 2020-05-05 DIAGNOSIS — Z20822 Contact with and (suspected) exposure to covid-19: Secondary | ICD-10-CM | POA: Diagnosis not present

## 2020-05-08 ENCOUNTER — Ambulatory Visit: Payer: Medicare PPO

## 2020-05-08 ENCOUNTER — Other Ambulatory Visit: Payer: Medicare PPO

## 2020-05-08 DIAGNOSIS — R5081 Fever presenting with conditions classified elsewhere: Secondary | ICD-10-CM | POA: Diagnosis not present

## 2020-05-08 DIAGNOSIS — R509 Fever, unspecified: Secondary | ICD-10-CM | POA: Diagnosis not present

## 2020-05-08 DIAGNOSIS — D61818 Other pancytopenia: Secondary | ICD-10-CM | POA: Diagnosis not present

## 2020-05-08 DIAGNOSIS — R001 Bradycardia, unspecified: Secondary | ICD-10-CM | POA: Diagnosis not present

## 2020-05-08 DIAGNOSIS — F411 Generalized anxiety disorder: Secondary | ICD-10-CM | POA: Diagnosis not present

## 2020-05-08 DIAGNOSIS — G8929 Other chronic pain: Secondary | ICD-10-CM | POA: Diagnosis not present

## 2020-05-08 DIAGNOSIS — Z8579 Personal history of other malignant neoplasms of lymphoid, hematopoietic and related tissues: Secondary | ICD-10-CM | POA: Diagnosis not present

## 2020-05-08 DIAGNOSIS — C9002 Multiple myeloma in relapse: Secondary | ICD-10-CM | POA: Diagnosis not present

## 2020-05-08 DIAGNOSIS — I472 Ventricular tachycardia: Secondary | ICD-10-CM | POA: Diagnosis not present

## 2020-05-08 DIAGNOSIS — I471 Supraventricular tachycardia: Secondary | ICD-10-CM | POA: Diagnosis not present

## 2020-05-08 DIAGNOSIS — R110 Nausea: Secondary | ICD-10-CM | POA: Diagnosis not present

## 2020-05-08 DIAGNOSIS — R1013 Epigastric pain: Secondary | ICD-10-CM | POA: Diagnosis not present

## 2020-05-08 DIAGNOSIS — R059 Cough, unspecified: Secondary | ICD-10-CM | POA: Diagnosis not present

## 2020-05-08 DIAGNOSIS — N189 Chronic kidney disease, unspecified: Secondary | ICD-10-CM | POA: Diagnosis not present

## 2020-05-08 DIAGNOSIS — B955 Unspecified streptococcus as the cause of diseases classified elsewhere: Secondary | ICD-10-CM | POA: Diagnosis not present

## 2020-05-08 DIAGNOSIS — R918 Other nonspecific abnormal finding of lung field: Secondary | ICD-10-CM | POA: Diagnosis not present

## 2020-05-08 DIAGNOSIS — Q231 Congenital insufficiency of aortic valve: Secondary | ICD-10-CM | POA: Diagnosis not present

## 2020-05-08 DIAGNOSIS — R0609 Other forms of dyspnea: Secondary | ICD-10-CM | POA: Diagnosis not present

## 2020-05-08 DIAGNOSIS — R638 Other symptoms and signs concerning food and fluid intake: Secondary | ICD-10-CM | POA: Diagnosis not present

## 2020-05-08 DIAGNOSIS — D61810 Antineoplastic chemotherapy induced pancytopenia: Secondary | ICD-10-CM | POA: Diagnosis not present

## 2020-05-08 DIAGNOSIS — D709 Neutropenia, unspecified: Secondary | ICD-10-CM | POA: Diagnosis not present

## 2020-05-08 DIAGNOSIS — R5381 Other malaise: Secondary | ICD-10-CM | POA: Diagnosis not present

## 2020-05-08 DIAGNOSIS — F419 Anxiety disorder, unspecified: Secondary | ICD-10-CM | POA: Diagnosis not present

## 2020-05-08 DIAGNOSIS — Z9484 Stem cells transplant status: Secondary | ICD-10-CM | POA: Diagnosis not present

## 2020-05-08 DIAGNOSIS — E785 Hyperlipidemia, unspecified: Secondary | ICD-10-CM | POA: Diagnosis not present

## 2020-05-08 DIAGNOSIS — G629 Polyneuropathy, unspecified: Secondary | ICD-10-CM | POA: Diagnosis not present

## 2020-05-08 DIAGNOSIS — C9001 Multiple myeloma in remission: Secondary | ICD-10-CM | POA: Diagnosis not present

## 2020-05-08 DIAGNOSIS — R739 Hyperglycemia, unspecified: Secondary | ICD-10-CM | POA: Diagnosis not present

## 2020-05-08 DIAGNOSIS — B954 Other streptococcus as the cause of diseases classified elsewhere: Secondary | ICD-10-CM | POA: Diagnosis not present

## 2020-05-08 DIAGNOSIS — I272 Pulmonary hypertension, unspecified: Secondary | ICD-10-CM | POA: Diagnosis not present

## 2020-05-08 DIAGNOSIS — J811 Chronic pulmonary edema: Secondary | ICD-10-CM | POA: Diagnosis not present

## 2020-05-08 DIAGNOSIS — R197 Diarrhea, unspecified: Secondary | ICD-10-CM | POA: Diagnosis not present

## 2020-05-08 DIAGNOSIS — Z7682 Awaiting organ transplant status: Secondary | ICD-10-CM | POA: Diagnosis not present

## 2020-05-08 DIAGNOSIS — I350 Nonrheumatic aortic (valve) stenosis: Secondary | ICD-10-CM | POA: Diagnosis not present

## 2020-05-08 DIAGNOSIS — I509 Heart failure, unspecified: Secondary | ICD-10-CM | POA: Diagnosis not present

## 2020-05-08 DIAGNOSIS — K5900 Constipation, unspecified: Secondary | ICD-10-CM | POA: Diagnosis not present

## 2020-05-08 DIAGNOSIS — R1310 Dysphagia, unspecified: Secondary | ICD-10-CM | POA: Diagnosis not present

## 2020-05-08 DIAGNOSIS — G894 Chronic pain syndrome: Secondary | ICD-10-CM | POA: Diagnosis not present

## 2020-05-08 DIAGNOSIS — Z5111 Encounter for antineoplastic chemotherapy: Secondary | ICD-10-CM | POA: Diagnosis not present

## 2020-05-08 DIAGNOSIS — K1370 Unspecified lesions of oral mucosa: Secondary | ICD-10-CM | POA: Diagnosis not present

## 2020-05-08 DIAGNOSIS — Z792 Long term (current) use of antibiotics: Secondary | ICD-10-CM | POA: Diagnosis not present

## 2020-05-08 DIAGNOSIS — E878 Other disorders of electrolyte and fluid balance, not elsewhere classified: Secondary | ICD-10-CM | POA: Diagnosis not present

## 2020-05-08 DIAGNOSIS — I493 Ventricular premature depolarization: Secondary | ICD-10-CM | POA: Diagnosis not present

## 2020-05-08 DIAGNOSIS — R0601 Orthopnea: Secondary | ICD-10-CM | POA: Diagnosis not present

## 2020-05-08 DIAGNOSIS — R0602 Shortness of breath: Secondary | ICD-10-CM | POA: Diagnosis not present

## 2020-05-08 DIAGNOSIS — Z9189 Other specified personal risk factors, not elsewhere classified: Secondary | ICD-10-CM | POA: Diagnosis not present

## 2020-05-08 DIAGNOSIS — R7881 Bacteremia: Secondary | ICD-10-CM | POA: Diagnosis not present

## 2020-05-08 DIAGNOSIS — I083 Combined rheumatic disorders of mitral, aortic and tricuspid valves: Secondary | ICD-10-CM | POA: Diagnosis not present

## 2020-05-08 DIAGNOSIS — Z7901 Long term (current) use of anticoagulants: Secondary | ICD-10-CM | POA: Diagnosis not present

## 2020-05-08 DIAGNOSIS — C9000 Multiple myeloma not having achieved remission: Secondary | ICD-10-CM | POA: Diagnosis not present

## 2020-05-09 ENCOUNTER — Other Ambulatory Visit: Payer: Self-pay | Admitting: Internal Medicine

## 2020-05-09 DIAGNOSIS — F324 Major depressive disorder, single episode, in partial remission: Secondary | ICD-10-CM

## 2020-05-09 NOTE — Telephone Encounter (Signed)
Requested Prescriptions  Pending Prescriptions Disp Refills   FLUoxetine (PROZAC) 40 MG capsule [Pharmacy Med Name: FLUOXETINE HYDROCHLORIDE 40 MG Capsule] 90 capsule 1    Sig: TAKE 1 CAPSULE EVERY DAY     Psychiatry:  Antidepressants - SSRI Passed - 05/09/2020 10:29 AM      Passed - Completed PHQ-2 or PHQ-9 in the last 360 days      Passed - Valid encounter within last 6 months    Recent Outpatient Visits          1 month ago Benign essential HTN   [facility] Glean Hess, MD   5 months ago Annual physical exam   [facility] Glean Hess, MD   6 months ago Benign essential HTN   [place], MD   8 months ago Acute non-recurrent sinusitis, unspecified location   [facility] Glean Hess, MD   1 year ago Fullness of supraclavicular fossa   [facility] Glean Hess, MD      Future Appointments            In 7 months [facility] Melia Jesse Sans, MD [facility]

## 2020-06-05 DIAGNOSIS — I080 Rheumatic disorders of both mitral and aortic valves: Secondary | ICD-10-CM | POA: Diagnosis not present

## 2020-06-05 DIAGNOSIS — R0602 Shortness of breath: Secondary | ICD-10-CM | POA: Diagnosis not present

## 2020-06-05 DIAGNOSIS — Z09 Encounter for follow-up examination after completed treatment for conditions other than malignant neoplasm: Secondary | ICD-10-CM | POA: Diagnosis not present

## 2020-06-05 DIAGNOSIS — I493 Ventricular premature depolarization: Secondary | ICD-10-CM | POA: Diagnosis not present

## 2020-06-05 DIAGNOSIS — M542 Cervicalgia: Secondary | ICD-10-CM | POA: Diagnosis not present

## 2020-06-05 DIAGNOSIS — Z9481 Bone marrow transplant status: Secondary | ICD-10-CM | POA: Diagnosis not present

## 2020-06-05 DIAGNOSIS — R5381 Other malaise: Secondary | ICD-10-CM | POA: Diagnosis not present

## 2020-06-05 DIAGNOSIS — C9000 Multiple myeloma not having achieved remission: Secondary | ICD-10-CM | POA: Diagnosis not present

## 2020-06-05 DIAGNOSIS — D61818 Other pancytopenia: Secondary | ICD-10-CM | POA: Diagnosis not present

## 2020-06-05 DIAGNOSIS — R110 Nausea: Secondary | ICD-10-CM | POA: Diagnosis not present

## 2020-06-05 DIAGNOSIS — Z9484 Stem cells transplant status: Secondary | ICD-10-CM | POA: Diagnosis not present

## 2020-06-06 ENCOUNTER — Encounter: Payer: Self-pay | Admitting: Hematology and Oncology

## 2020-06-07 ENCOUNTER — Telehealth: Payer: Self-pay

## 2020-06-07 ENCOUNTER — Other Ambulatory Visit: Payer: Self-pay

## 2020-06-07 DIAGNOSIS — D649 Anemia, unspecified: Secondary | ICD-10-CM

## 2020-06-07 DIAGNOSIS — D801 Nonfamilial hypogammaglobulinemia: Secondary | ICD-10-CM

## 2020-06-07 DIAGNOSIS — R110 Nausea: Secondary | ICD-10-CM

## 2020-06-07 DIAGNOSIS — C9000 Multiple myeloma not having achieved remission: Secondary | ICD-10-CM

## 2020-06-07 NOTE — Telephone Encounter (Signed)
-----   Message from Secundino Ginger sent at 06/07/2020  2:26 PM EST ----- Regarding: RE: Vita Barley nurse from [facility] called me back. They are seeing [facility] Tuesday and will send you a transition letter on [place]. On this Thursday she asked that a CBC Creatinine, and Mag be drawn and she will check care everywhere. That way they won't draw labs there on Tuesday. I hope this was helpful. Her number is [phone] if you need to talk to her.  ----- Message ----- From: Lequita Asal, MD Sent: 06/07/2020  12:50 PM EST To: Secundino Ginger Subject: RE: Floreen Comber,  Please call the transplant center at [facility] and find out what they want [place] to follow.  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  Is this only for her magnesium?  M  ----- Message ----- From: Secundino Ginger Sent: 06/06/2020   3:40 PM EST To: Lequita Asal, MD Subject: RE: Alonza Smoker got her on for Thursday if you will put labs in. ----- Message ----- From: Lequita Asal, MD Sent: 06/06/2020   3:10 PM EST To: Secundino Ginger, Drue Dun, RN Subject: RE: Floreen Comber,  Please call patient.  [facility] has not spoken to me.  She is early s/p transplant.  I am ok with her coming for labs and possible IV Mg.  I will need [facility] to call to discuss things with me (ie, plan).  M ----- Message ----- From: Secundino Ginger Sent: 06/06/2020   1:20 PM EST To: Lequita Asal, MD Subject: Dierdre Searles called and asked if Edwina is coming this week because Monti talked to them and told them she was. Do you want me to schedule her for Thursday for labs / possible mag? Rhonda (nurse) at [facility] said her Iris Pert was 1.7 on Monday. If so, [facility] asked we draw CBC, Basic Chemistries and Mag for them.

## 2020-06-08 ENCOUNTER — Other Ambulatory Visit: Payer: Self-pay

## 2020-06-08 ENCOUNTER — Inpatient Hospital Stay: Payer: Medicare PPO | Attending: Hematology and Oncology

## 2020-06-08 ENCOUNTER — Inpatient Hospital Stay: Payer: Medicare PPO

## 2020-06-08 ENCOUNTER — Telehealth: Payer: Self-pay

## 2020-06-08 DIAGNOSIS — G620 Drug-induced polyneuropathy: Secondary | ICD-10-CM | POA: Diagnosis not present

## 2020-06-08 DIAGNOSIS — Z79899 Other long term (current) drug therapy: Secondary | ICD-10-CM | POA: Diagnosis not present

## 2020-06-08 DIAGNOSIS — T451X5A Adverse effect of antineoplastic and immunosuppressive drugs, initial encounter: Secondary | ICD-10-CM | POA: Insufficient documentation

## 2020-06-08 DIAGNOSIS — D649 Anemia, unspecified: Secondary | ICD-10-CM

## 2020-06-08 DIAGNOSIS — C9000 Multiple myeloma not having achieved remission: Secondary | ICD-10-CM | POA: Insufficient documentation

## 2020-06-08 DIAGNOSIS — Z9484 Stem cells transplant status: Secondary | ICD-10-CM | POA: Diagnosis not present

## 2020-06-08 DIAGNOSIS — D801 Nonfamilial hypogammaglobulinemia: Secondary | ICD-10-CM

## 2020-06-08 LAB — CBC WITH DIFFERENTIAL/PLATELET
Abs Immature Granulocytes: 0.01 10*3/uL (ref 0.00–0.07)
Basophils Absolute: 0 10*3/uL (ref 0.0–0.1)
Basophils Relative: 1 %
Eosinophils Absolute: 0.1 10*3/uL (ref 0.0–0.5)
Eosinophils Relative: 3 %
HCT: 25.5 % — ABNORMAL LOW (ref 36.0–46.0)
Hemoglobin: 8.8 g/dL — ABNORMAL LOW (ref 12.0–15.0)
Immature Granulocytes: 1 %
Lymphocytes Relative: 10 %
Lymphs Abs: 0.2 10*3/uL — ABNORMAL LOW (ref 0.7–4.0)
MCH: 28.9 pg (ref 26.0–34.0)
MCHC: 34.5 g/dL (ref 30.0–36.0)
MCV: 83.9 fL (ref 80.0–100.0)
Monocytes Absolute: 0.4 10*3/uL (ref 0.1–1.0)
Monocytes Relative: 17 %
Neutro Abs: 1.5 10*3/uL — ABNORMAL LOW (ref 1.7–7.7)
Neutrophils Relative %: 68 %
Platelets: 75 10*3/uL — ABNORMAL LOW (ref 150–400)
RBC: 3.04 MIL/uL — ABNORMAL LOW (ref 3.87–5.11)
RDW: 16.9 % — ABNORMAL HIGH (ref 11.5–15.5)
WBC: 2.2 10*3/uL — ABNORMAL LOW (ref 4.0–10.5)
nRBC: 0 % (ref 0.0–0.2)

## 2020-06-08 LAB — CREATININE, SERUM
Creatinine, Ser: 1.28 mg/dL — ABNORMAL HIGH (ref 0.44–1.00)
GFR, Estimated: 47 mL/min — ABNORMAL LOW (ref >60)

## 2020-06-08 LAB — MAGNESIUM: Magnesium: 1.3 mg/dL — ABNORMAL LOW (ref 1.7–2.4)

## 2020-06-08 MED ORDER — HEPARIN SOD (PORK) LOCK FLUSH 100 UNIT/ML IV SOLN
500.0000 [IU] | Freq: Once | INTRAVENOUS | Status: AC
  Administered 2020-06-08: 500 [IU] via INTRAVENOUS
  Filled 2020-06-08: qty 5

## 2020-06-08 MED ORDER — PROMETHAZINE HCL 25 MG/ML IJ SOLN
12.5000 mg | Freq: Once | INTRAMUSCULAR | Status: AC
  Administered 2020-06-08: 12.5 mg via INTRAVENOUS

## 2020-06-08 MED ORDER — PROMETHAZINE HCL 25 MG/ML IJ SOLN
INTRAMUSCULAR | Status: AC
  Filled 2020-06-08: qty 1

## 2020-06-08 MED ORDER — SODIUM CHLORIDE 0.9 % IV SOLN
INTRAVENOUS | Status: DC
  Filled 2020-06-08: qty 250

## 2020-06-08 MED ORDER — MAGNESIUM SULFATE 4 GM/100ML IV SOLN
4.0000 g | Freq: Once | INTRAVENOUS | Status: AC
  Administered 2020-06-08: 4 g via INTRAVENOUS
  Filled 2020-06-08: qty 100

## 2020-06-08 NOTE — Telephone Encounter (Signed)
-----   Message from Jorene Minors, RN sent at 06/08/2020  1:50 PM EST -----  We can set that up.  Labs (BMP and Mg) Mondays and Thursdays with +/- IV Mg.  As soon as [facility] releases her to follow-up here, I will see her for assessment in clinic.  M  ----- Message ----- From: Secundino Ginger Sent: 06/08/2020  12:48 PM EST To: Lequita Asal, MD Subject: appt                                           Lineth came today for IV MAg. She is asking about next week. She usually came on Mom & Thursday. She sees [facility] dr on Tuesday. Please Advise!

## 2020-06-08 NOTE — Progress Notes (Signed)
Pt received mag 4gms, and phenergan 12.5mg  for nausea, VSS, Pt d/ced home

## 2020-06-12 ENCOUNTER — Other Ambulatory Visit: Payer: Self-pay

## 2020-06-12 ENCOUNTER — Inpatient Hospital Stay: Payer: Medicare PPO

## 2020-06-12 VITALS — BP 143/82 | HR 93

## 2020-06-12 DIAGNOSIS — C9000 Multiple myeloma not having achieved remission: Secondary | ICD-10-CM | POA: Diagnosis not present

## 2020-06-12 DIAGNOSIS — D649 Anemia, unspecified: Secondary | ICD-10-CM | POA: Diagnosis not present

## 2020-06-12 DIAGNOSIS — Z79899 Other long term (current) drug therapy: Secondary | ICD-10-CM | POA: Diagnosis not present

## 2020-06-12 DIAGNOSIS — T451X5A Adverse effect of antineoplastic and immunosuppressive drugs, initial encounter: Secondary | ICD-10-CM | POA: Diagnosis not present

## 2020-06-12 DIAGNOSIS — R110 Nausea: Secondary | ICD-10-CM

## 2020-06-12 DIAGNOSIS — G620 Drug-induced polyneuropathy: Secondary | ICD-10-CM | POA: Diagnosis not present

## 2020-06-12 LAB — CBC WITH DIFFERENTIAL/PLATELET
Abs Immature Granulocytes: 0.01 10*3/uL (ref 0.00–0.07)
Basophils Absolute: 0 10*3/uL (ref 0.0–0.1)
Basophils Relative: 1 %
Eosinophils Absolute: 0 10*3/uL (ref 0.0–0.5)
Eosinophils Relative: 0 %
HCT: 25.9 % — ABNORMAL LOW (ref 36.0–46.0)
Hemoglobin: 9 g/dL — ABNORMAL LOW (ref 12.0–15.0)
Immature Granulocytes: 0 %
Lymphocytes Relative: 17 %
Lymphs Abs: 0.5 10*3/uL — ABNORMAL LOW (ref 0.7–4.0)
MCH: 29.4 pg (ref 26.0–34.0)
MCHC: 34.7 g/dL (ref 30.0–36.0)
MCV: 84.6 fL (ref 80.0–100.0)
Monocytes Absolute: 0.4 10*3/uL (ref 0.1–1.0)
Monocytes Relative: 12 %
Neutro Abs: 2.1 10*3/uL (ref 1.7–7.7)
Neutrophils Relative %: 70 %
Platelets: 78 10*3/uL — ABNORMAL LOW (ref 150–400)
RBC: 3.06 MIL/uL — ABNORMAL LOW (ref 3.87–5.11)
RDW: 18.5 % — ABNORMAL HIGH (ref 11.5–15.5)
WBC: 3 10*3/uL — ABNORMAL LOW (ref 4.0–10.5)
nRBC: 0 % (ref 0.0–0.2)

## 2020-06-12 LAB — BASIC METABOLIC PANEL
Anion gap: 15 (ref 5–15)
BUN: 14 mg/dL (ref 8–23)
CO2: 21 mmol/L — ABNORMAL LOW (ref 22–32)
Calcium: 8.6 mg/dL — ABNORMAL LOW (ref 8.9–10.3)
Chloride: 101 mmol/L (ref 98–111)
Creatinine, Ser: 1.03 mg/dL — ABNORMAL HIGH (ref 0.44–1.00)
GFR, Estimated: >60 mL/min (ref >60)
Glucose, Bld: 148 mg/dL — ABNORMAL HIGH (ref 70–99)
Potassium: 3.7 mmol/L (ref 3.5–5.1)
Sodium: 137 mmol/L (ref 135–145)

## 2020-06-12 LAB — MAGNESIUM: Magnesium: 1.2 mg/dL — ABNORMAL LOW (ref 1.7–2.4)

## 2020-06-12 MED ORDER — MAGNESIUM SULFATE 2 GM/50ML IV SOLN
2.0000 g | Freq: Once | INTRAVENOUS | Status: AC
  Administered 2020-06-12: 2 g via INTRAVENOUS
  Filled 2020-06-12: qty 50

## 2020-06-12 MED ORDER — PROMETHAZINE HCL 25 MG/ML IJ SOLN
25.0000 mg | Freq: Once | INTRAMUSCULAR | Status: AC
  Administered 2020-06-12: 25 mg via INTRAVENOUS
  Filled 2020-06-12: qty 1

## 2020-06-12 MED ORDER — CYANOCOBALAMIN 1000 MCG/ML IJ SOLN
1000.0000 ug | Freq: Once | INTRAMUSCULAR | Status: AC
  Administered 2020-06-12: 1000 ug via INTRAMUSCULAR
  Filled 2020-06-12: qty 1

## 2020-06-12 MED ORDER — SODIUM CHLORIDE 0.9 % IV SOLN
Freq: Once | INTRAVENOUS | Status: AC
  Filled 2020-06-12: qty 250

## 2020-06-12 MED ORDER — SODIUM CHLORIDE 0.9% FLUSH
10.0000 mL | INTRAVENOUS | Status: DC | PRN
  Administered 2020-06-12: 10 mL
  Filled 2020-06-12: qty 10

## 2020-06-12 MED ORDER — SODIUM CHLORIDE 0.9 % IV SOLN: 6.0000 g | Freq: Once | INTRAVENOUS | Status: DC

## 2020-06-12 MED ORDER — HEPARIN SOD (PORK) LOCK FLUSH 100 UNIT/ML IV SOLN
500.0000 [IU] | Freq: Once | INTRAVENOUS | Status: AC | PRN
Start: 2020-06-12 — End: 2020-06-12
  Administered 2020-06-12: 500 [IU]
  Filled 2020-06-12: qty 5

## 2020-06-12 MED ORDER — MAGNESIUM SULFATE 4 GM/100ML IV SOLN
4.0000 g | Freq: Once | INTRAVENOUS | Status: AC
  Administered 2020-06-12: 4 g via INTRAVENOUS
  Filled 2020-06-12: qty 100

## 2020-06-12 NOTE — Progress Notes (Signed)
Patient received prescribed treatment in clinic. Tolerated well. Patient stable at discharge. 

## 2020-06-13 DIAGNOSIS — Z20828 Contact with and (suspected) exposure to other viral communicable diseases: Secondary | ICD-10-CM | POA: Diagnosis not present

## 2020-06-13 DIAGNOSIS — Z20822 Contact with and (suspected) exposure to covid-19: Secondary | ICD-10-CM | POA: Diagnosis not present

## 2020-06-13 DIAGNOSIS — Z794 Long term (current) use of insulin: Secondary | ICD-10-CM | POA: Diagnosis not present

## 2020-06-13 DIAGNOSIS — C9001 Multiple myeloma in remission: Secondary | ICD-10-CM | POA: Diagnosis not present

## 2020-06-13 DIAGNOSIS — D649 Anemia, unspecified: Secondary | ICD-10-CM | POA: Diagnosis not present

## 2020-06-13 DIAGNOSIS — C9000 Multiple myeloma not having achieved remission: Secondary | ICD-10-CM | POA: Diagnosis not present

## 2020-06-13 DIAGNOSIS — Z09 Encounter for follow-up examination after completed treatment for conditions other than malignant neoplasm: Secondary | ICD-10-CM | POA: Diagnosis not present

## 2020-06-13 DIAGNOSIS — D84822 Immunodeficiency due to external causes: Secondary | ICD-10-CM | POA: Diagnosis not present

## 2020-06-13 DIAGNOSIS — Z87891 Personal history of nicotine dependence: Secondary | ICD-10-CM | POA: Diagnosis not present

## 2020-06-13 DIAGNOSIS — R059 Cough, unspecified: Secondary | ICD-10-CM | POA: Diagnosis not present

## 2020-06-13 DIAGNOSIS — E1122 Type 2 diabetes mellitus with diabetic chronic kidney disease: Secondary | ICD-10-CM | POA: Diagnosis not present

## 2020-06-13 DIAGNOSIS — Z9484 Stem cells transplant status: Secondary | ICD-10-CM | POA: Diagnosis not present

## 2020-06-13 DIAGNOSIS — D696 Thrombocytopenia, unspecified: Secondary | ICD-10-CM | POA: Diagnosis not present

## 2020-06-13 DIAGNOSIS — D571 Sickle-cell disease without crisis: Secondary | ICD-10-CM | POA: Diagnosis not present

## 2020-06-14 ENCOUNTER — Other Ambulatory Visit: Payer: Self-pay

## 2020-06-14 ENCOUNTER — Encounter: Payer: Self-pay | Admitting: Internal Medicine

## 2020-06-14 DIAGNOSIS — M47816 Spondylosis without myelopathy or radiculopathy, lumbar region: Secondary | ICD-10-CM

## 2020-06-14 MED ORDER — METHOCARBAMOL 500 MG PO TABS: 500.0000 mg | ORAL_TABLET | Freq: Four times a day (QID) | ORAL | 0 refills | Status: DC

## 2020-06-15 ENCOUNTER — Other Ambulatory Visit: Payer: Self-pay

## 2020-06-15 ENCOUNTER — Telehealth: Payer: Self-pay | Admitting: *Deleted

## 2020-06-15 ENCOUNTER — Inpatient Hospital Stay: Payer: Medicare PPO

## 2020-06-15 VITALS — BP 113/54 | HR 92 | Temp 98.6°F

## 2020-06-15 DIAGNOSIS — T451X5A Adverse effect of antineoplastic and immunosuppressive drugs, initial encounter: Secondary | ICD-10-CM | POA: Diagnosis not present

## 2020-06-15 DIAGNOSIS — Z79899 Other long term (current) drug therapy: Secondary | ICD-10-CM | POA: Diagnosis not present

## 2020-06-15 DIAGNOSIS — G620 Drug-induced polyneuropathy: Secondary | ICD-10-CM | POA: Diagnosis not present

## 2020-06-15 DIAGNOSIS — C9000 Multiple myeloma not having achieved remission: Secondary | ICD-10-CM

## 2020-06-15 DIAGNOSIS — R110 Nausea: Secondary | ICD-10-CM

## 2020-06-15 DIAGNOSIS — D649 Anemia, unspecified: Secondary | ICD-10-CM | POA: Diagnosis not present

## 2020-06-15 LAB — BASIC METABOLIC PANEL
Anion gap: 10 (ref 5–15)
BUN: 17 mg/dL (ref 8–23)
CO2: 20 mmol/L — ABNORMAL LOW (ref 22–32)
Calcium: 8.6 mg/dL — ABNORMAL LOW (ref 8.9–10.3)
Chloride: 101 mmol/L (ref 98–111)
Creatinine, Ser: 1.12 mg/dL — ABNORMAL HIGH (ref 0.44–1.00)
GFR, Estimated: 55 mL/min — ABNORMAL LOW (ref >60)
Glucose, Bld: 132 mg/dL — ABNORMAL HIGH (ref 70–99)
Potassium: 3.7 mmol/L (ref 3.5–5.1)
Sodium: 131 mmol/L — ABNORMAL LOW (ref 135–145)

## 2020-06-15 LAB — MAGNESIUM: Magnesium: 1.3 mg/dL — ABNORMAL LOW (ref 1.7–2.4)

## 2020-06-15 MED ORDER — SODIUM CHLORIDE 0.9% FLUSH
10.0000 mL | Freq: Once | INTRAVENOUS | Status: AC | PRN
  Administered 2020-06-15: 10 mL
  Filled 2020-06-15: qty 10

## 2020-06-15 MED ORDER — SODIUM CHLORIDE 0.9 % IV SOLN: 6.0000 g | Freq: Once | INTRAVENOUS | Status: DC

## 2020-06-15 MED ORDER — SODIUM CHLORIDE 0.9 % IV SOLN
Freq: Once | INTRAVENOUS | Status: AC
  Filled 2020-06-15: qty 250

## 2020-06-15 MED ORDER — MAGNESIUM SULFATE 4 GM/100ML IV SOLN
4.0000 g | Freq: Once | INTRAVENOUS | Status: AC
  Administered 2020-06-15: 4 g via INTRAVENOUS
  Filled 2020-06-15: qty 100

## 2020-06-15 MED ORDER — HEPARIN SOD (PORK) LOCK FLUSH 100 UNIT/ML IV SOLN
500.0000 [IU] | Freq: Once | INTRAVENOUS | Status: AC | PRN
  Administered 2020-06-15: 500 [IU]
  Filled 2020-06-15: qty 5

## 2020-06-15 MED ORDER — PROMETHAZINE HCL 25 MG/ML IJ SOLN
25.0000 mg | Freq: Once | INTRAMUSCULAR | Status: AC
  Administered 2020-06-15: 25 mg via INTRAVENOUS
  Filled 2020-06-15: qty 1

## 2020-06-15 MED ORDER — MAGNESIUM SULFATE 2 GM/50ML IV SOLN
2.0000 g | Freq: Once | INTRAVENOUS | Status: AC
  Administered 2020-06-15: 2 g via INTRAVENOUS
  Filled 2020-06-15: qty 50

## 2020-06-15 NOTE — Telephone Encounter (Signed)
Jinny Blossom, prior authorization from [facility] left contact information if there are any questions at appointment Monday [phone]

## 2020-06-15 NOTE — Progress Notes (Signed)
Patient received prescribed treatment in clinic. Tolerated well. Patient stable at discharge. 

## 2020-06-16 DIAGNOSIS — Z9481 Bone marrow transplant status: Secondary | ICD-10-CM | POA: Diagnosis not present

## 2020-06-16 DIAGNOSIS — R5381 Other malaise: Secondary | ICD-10-CM | POA: Diagnosis not present

## 2020-06-16 DIAGNOSIS — B955 Unspecified streptococcus as the cause of diseases classified elsewhere: Secondary | ICD-10-CM | POA: Diagnosis not present

## 2020-06-16 DIAGNOSIS — D61818 Other pancytopenia: Secondary | ICD-10-CM | POA: Diagnosis not present

## 2020-06-16 DIAGNOSIS — C9000 Multiple myeloma not having achieved remission: Secondary | ICD-10-CM | POA: Diagnosis not present

## 2020-06-16 DIAGNOSIS — I493 Ventricular premature depolarization: Secondary | ICD-10-CM | POA: Diagnosis not present

## 2020-06-16 DIAGNOSIS — R5081 Fever presenting with conditions classified elsewhere: Secondary | ICD-10-CM | POA: Diagnosis not present

## 2020-06-16 DIAGNOSIS — Z9484 Stem cells transplant status: Secondary | ICD-10-CM | POA: Diagnosis not present

## 2020-06-16 DIAGNOSIS — I080 Rheumatic disorders of both mitral and aortic valves: Secondary | ICD-10-CM | POA: Diagnosis not present

## 2020-06-18 NOTE — Progress Notes (Addendum)
[facility]  [address] [place], [place] [address] Phone: [phone]  Fax: [phone]   Clinic Day:  06/19/2020   Referring physician: Glean Hess, MD  Chief Complaint: Sarah [name] is a 64 y.o. 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  HPI: The patient was last seen in the medical oncology clinic on 04/24/2020.  At that time, she felt "ok". She had some pain at her bone marrow biopsy site (6/10). Her neuropathy was stable; she had occasional sharp pains in her hands and feet.  Exam was stable. Hematocrit was 32.0, hemoglobin 10.7, MCV 86.5, platelets 138,000, WBC 5,300. Creatinine was 1.12 (CrCl 55 ml/min). Magnesium was 1.0. IgG was 430. She received 6 grams IV magnesium and IVIG. She was to call after her bone marrow transplant to schedule an appointment.  She received IV fluids on 04/25/2020.  The patient was admitted to [facility] from 05/08/2020 - 06/04/2020 for an autologous bone marrow transplant.   She received conditioning with melphalan 140 mg/m2.  Bone marrow transplant was on 05/10/2020. The patient developed fevers daily from 05/19/2020 - 05/24/2020. Blood cultures were + for strept sanguis bacteremia.  She was treated cefepime, vancomycin, and solumedrol for possible engraftment syndrome. Cefepime was switched to ceftriaxone; she completed 2 weeks of ceftriaxone on 06/03/2020. She had chemotherapy induced diarrhea.  She experienced urinary retention.  She received a platelet transfusion on 05/19/2020 (platelets 8,000). She received 2 units of PRBCs (05/22/2020 and 05/23/2020). She had chest pain during and after the transfusion; pain was felt to be volume related and she received 20 mg Lasix. Her last Granix injection was on 05/22/2020.  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 Cardiology was consulted for possible TAVR; the procedure was postponed. She will be re-evaluated as outpatient by Dr Hoyt Koch on 06/20/2020.  She was seen on 06/16/2020 at [facility] by Laurene Footman, NP in the transplant center.  She was day 37 s/p autologous stem cell transplant.  She was felt deconditioned from a prolonged hospitalization.  Prilosec was increased to 40 mg BID for presumed stomach ulcer.  She had diarrhea (C diff negative); she used Imodium prn.  Prophylaxis includesacyclovir 200 mg po BID; Bactrim DS on Mondays, Wednesdays, and Fridays (began 06/01/2020).  She saw Dr Shari Prows in the myeloma clinic at [facility] on 06/16/2020.  They discussed post transplant maintenance therapy (approximately 60 days s/p transplant) including bortezomib every 2 weeks.  CBC on 06/12/2020 revealed a hematocrit 25.9, hemoglobin 9.0, MCV 84.6, platelets 78,000, and WBC 3,000 (ANC 2,100). Creatinine was 1.12 (CrCl 55 ml/min) on 06/15/2020. Calcium 8.6. Sodium 131.  Magnesium was 1.1 on 04/27/2020, 1.2 on 05/01/2020, 1.3 on 05/03/2020, 1.3 on 06/08/2020, 1.2 on 06/12/2020 and 1.3 on 06/15/2020.  She received 6 grams IV magnesium on 04/27/2020, 05/01/2020, 05/03/2020, 06/12/2020, and 06/15/2020. She received 4 grams IV magnesium on 06/08/2020.   She received a vitamin B12 injection on 06/12/2020.  During the interim, she has been "getting there." She is glad she received the transplant, but was not expecting to stay in the hospital for so long. She is still a bit weak and shaky. Her neuropathy is a little bit worse and she has some burning pain in her toes. She has diarrhea that is the consistency of oatmeal (C. diff negative) and  sometimes struggles to get to the bathroom in time. She reports nausea and takes Phenergan 2-3x per day at home.  She is seeing cardiology tomorrow. She reports that her heart has been "skipping around" since she had a reaction to  a blood transfusion while in the hospital.   Past Medical History:  Diagnosis Date  . Abnormal stress test 02/14/2016   Overview:  Added automatically from request for surgery [id number]  . Anemia   . Anxiety   . Arthritis   . Bicuspid aortic valve   . CHF (congestive heart failure) ([place])   . CKD (chronic kidney disease) stage 3, GFR 30-59 ml/min (HCC)   . Depression   . Diabetes mellitus ([place])   . Dizziness   . Fatty liver   . Frequent falls   . GERD (gastroesophageal reflux disease)   . Gout   . Heart murmur   . History of blood transfusion   . History of bone marrow transplant ([place])   . History of uterine fibroid   . Hx of cardiac catheterization 06/05/2016   Overview:  Normal coronaries 2017  . Hypertension   . Hypomagnesemia   . Multiple myeloma ([place])   . Personal history of chemotherapy   . Renal cyst     Past Surgical History:  Procedure Laterality Date  . ABDOMINAL HYSTERECTOMY    . 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 CARDIAC ELECTROPHYSIOLOGY MAPPING AND ABLATION    . CARPAL TUNNEL RELEASE Bilateral   . CHOLECYSTECTOMY  2008  . COLONOSCOPY WITH PROPOFOL N/A 05/07/2017   Procedure: COLONOSCOPY WITH PROPOFOL;  Surgeon: [name], MD;  Location: [facility] ENDOSCOPY;  Service: Gastroenterology;  Laterality: N/A;  . ESOPHAGOGASTRODUODENOSCOPY (EGD) WITH PROPOFOL N/A 05/07/2017   Procedure: ESOPHAGOGASTRODUODENOSCOPY (EGD) WITH PROPOFOL;  Surgeon: [name], MD;  Location: [facility] ENDOSCOPY;  Service: Gastroenterology;  Laterality: N/A;  . FOOT SURGERY Bilateral   . INCONTINENCE SURGERY  2009  . INTERSTIM IMPLANT PLACEMENT    . other     over active bladder  . OTHER SURGICAL HISTORY     bladder stimulator   . PARTIAL HYSTERECTOMY  03/1996   fibroids  . PORTA CATH INSERTION N/A 03/10/2019   Procedure: PORTA CATH INSERTION;  Surgeon: Algernon Huxley, MD;  Location: [facility] LAB;  Service: Cardiovascular;  Laterality: N/A;  . TONSILLECTOMY  2007    Family History   Problem Relation Age of Onset  . Colon cancer Father   . Renal Disease Father   . Diabetes Mellitus II Father   . Melanoma Paternal Grandmother   . Breast cancer Maternal Aunt 68  . Anemia Mother   . Heart disease Mother   . Heart failure Mother   . Renal Disease Mother   . Congestive Heart Failure Mother   . Heart disease Maternal Uncle   . Throat cancer Maternal Uncle   . Lung cancer Maternal Uncle   . Liver disease Maternal Uncle   . Heart failure Maternal Uncle   . Hearing loss Son 66       Suicide     Social History:  reports that she quit smoking about 28 years ago. Her smoking use included cigarettes. She has a 20.00 pack-year smoking history. She has never used smokeless tobacco. She reports current alcohol use. She reports that she does not use drugs. Patient has not had ETOH in several months. She is on disability. She notes exposure to perchloroethylene [facility]).She lives much of her adult life in [place]  [place]. She was married with 2 sons. Her husband passed away. Her 2 sons took their own lives (age 79 and 37). She worked in [employer] in [employer]. She went back to school and earned an [employer] and works for [employer] for several years.She lives[place] sistersin [place]. She has two dogs, Harley and [place]. The patient is alone today.   Allergies:  Allergies  Allergen Reactions  . Oxycodone-Acetaminophen Anaphylaxis    Swelling and rash  . Celebrex [Celecoxib] Diarrhea  . Codeine   . Plerixafor     In 2016 during ASCT collection patient developed fever to 103.1F and required hospitalization  . Benadryl [Diphenhydramine] Palpitations  . Morphine Itching and Rash  . Ondansetron Diarrhea  . Tylenol [Acetaminophen] Itching and Rash    Current Medications: Current Outpatient Medications  Medication Sig Dispense Refill  . acetaminophen (TYLENOL) 500 MG tablet Take 500 mg by mouth every 6 (six) hours as needed.     Marland Kitchen acyclovir (ZOVIRAX) 400  MG tablet Take 1 tablet (400 mg total) by mouth 2 (two) times daily. 60 tablet 11  . allopurinol (ZYLOPRIM) 100 MG tablet TAKE 1 TABLET(100 MG) BY MOUTH DAILY as needed 90 tablet 1  . aspirin 81 MG chewable tablet Chew 1 tablet (81 mg total) by mouth daily. 30 tablet 0  . diclofenac sodium (VOLTAREN) 1 % GEL Apply 2 g topically 4 (four) times daily. 100 g 1  . diphenoxylate-atropine (LOMOTIL) 2.5-0.025 MG tablet Take 2 tablets by mouth daily as needed for diarrhea or loose stools. 45 tablet 2  . fentaNYL (DURAGESIC - DOSED MCG/HR) 25 MCG/HR patch Place 25 mcg onto the skin every 3 (three) days.     Marland Kitchen FLUoxetine (PROZAC) 40 MG capsule TAKE 1 CAPSULE EVERY DAY 90 capsule 1  . fluticasone (FLONASE) 50 MCG/ACT nasal spray Place 2 sprays into both nostrils daily. 16 g 2  . glucose blood test strip     . lactulose (CHRONULAC) 10 GM/15ML solution Take 20 g by mouth daily as needed.     . lidocaine-prilocaine (EMLA) cream APPLY TOPICALLY AS NEEDED. 30 g 0  . metFORMIN (GLUCOPHAGE-XR) 500 MG 24 hr tablet TAKE 1 TABLET (500 MG TOTAL) BY MOUTH DAILY WITH BREAKFAST. 90 tablet 1  . methocarbamol (ROBAXIN) 500 MG tablet Take 1 tablet (500 mg total) by mouth 4 (four) times daily. 120 tablet 0  . Multiple Vitamin (MULTIVITAMIN) tablet Take 1 tablet by mouth daily.    . Multiple Vitamins-Minerals (HAIR/SKIN/NAILS) TABS Take 1 tablet by mouth daily.    Marland Kitchen omeprazole (PRILOSEC) 40 MG capsule TAKE 1 CAPSULE EVERY DAY 90 capsule 3  . promethazine (PHENERGAN) 25 MG tablet Take 1 tablet (25 mg total) by mouth every 6 (six) hours as needed for nausea or vomiting. 90 tablet 0  . promethazine-dextromethorphan (PROMETHAZINE-DM) 6.25-15 MG/5ML syrup Take 5 mLs by mouth 4 (four) times daily as needed for cough. 240 mL 0  . traZODone (DESYREL) 100 MG tablet TAKE 1 TABLET AT BEDTIME  FOR  SLEEP 90 tablet 0  . dexamethasone (DECADRON) 4 MG tablet Take 5 tablets (68m) on days 2, 16 of cycle 3,4, 5 and 6 Take 5 tablets (2107m on  days 2 of cycles 7 and beyond (Patient not taking: No sig reported) 50 tablet 0  . diphenhydrAMINE (BENADRYL) 50 MG capsule Take 50 mg by mouth as needed.  (Patient not taking: No sig reported)    . fexofenadine (ALLEGRA) 180 MG tablet TAKE 1 TABLET(180 MG) BY  MOUTH DAILY (Patient not taking: No sig reported) 30 tablet 5  . montelukast (SINGULAIR) 10 MG tablet Take 1 tablet by mouth on the day before treatment and for 2 days after treatment. (Patient not taking: No sig reported) 30 tablet 0  . tiZANidine (ZANAFLEX) 4 MG tablet Take 1 tablet (4 mg total) by mouth every 8 (eight) hours as needed for muscle spasms. (Patient not taking: No sig reported) 270 tablet 1   No current facility-administered medications for this visit.   Facility-Administered Medications Ordered in Other Visits  Medication Dose Route Frequency Provider Last Rate Last Admin  . 0.9 %  sodium chloride infusion   Intravenous Continuous Lequita Asal, MD   Stopped at 01/20/20 1232  . heparin lock flush 100 unit/mL  500 Units Intravenous Once Faythe Casa E, NP      . sodium chloride flush (NS) 0.9 % injection 10 mL  10 mL Intravenous PRN Lequita Asal, MD   10 mL at 02/07/20 0815  . sodium chloride flush (NS) 0.9 % injection 10 mL  10 mL Intracatheter PRN Cammie Sickle, MD   10 mL at 02/10/20 0815  . sodium chloride flush (NS) 0.9 % injection 10 mL  10 mL Intravenous PRN Jacquelin Hawking, NP   10 mL at 03/01/20 1143    Review of Systems  Constitutional: Positive for weight loss (8 lbs). Negative for chills, diaphoresis, fever and malaise/fatigue.       Feels like she is "getting there"  HENT: Negative.  Negative for congestion, ear discharge, ear pain, hearing loss, nosebleeds, sinus pain, sore throat and tinnitus.   Eyes: Negative.  Negative for blurred vision.  Respiratory: Negative for cough, hemoptysis, sputum production, shortness of breath and wheezing.   Cardiovascular: Negative.  Negative for  chest pain, palpitations and leg swelling.       Heart "skipping around."  Gastrointestinal: Positive for diarrhea (oatmeal consistency) and nausea (chronic). Negative for abdominal pain, blood in stool, constipation, heartburn (on Prilosec), melena and vomiting.  Genitourinary: Negative.  Negative for dysuria, frequency and urgency.  Musculoskeletal: Positive for back pain (chronic, 6/10). Negative for falls, joint pain, myalgias and neck pain.  Skin: Negative.  Negative for itching and rash.  Neurological: Positive for sensory change (chronic numbness in fingers and toes (worse) w/ burning toe pain) and weakness (generalized, s/p transplant). Negative for dizziness, tingling and headaches.       Shaky s/p transplant  Endo/Heme/Allergies: Negative for environmental allergies. Does not bruise/bleed easily.       Diabetes, well controlled.  Psychiatric/Behavioral: Negative.  Negative for depression and memory loss. The patient is not nervous/anxious and does not have insomnia.   All other systems reviewed and are negative.   Performance status (ECOG): 1  Vitals Blood pressure 121/72, pulse 65, temperature (!) 97 F (36.1 C), temperature source Tympanic, weight 137 lb 14.4 oz (62.5 kg), SpO2 99 %.  Physical Exam Vitals and nursing note reviewed.  Constitutional:      General: She is not in acute distress.    Appearance: She is well-developed. She is not ill-appearing, toxic-appearing or diaphoretic.     Interventions: Face mask in place.  HENT:     Head: Normocephalic and atraumatic.     Comments: Short wig.    Mouth/Throat:     Mouth: Mucous membranes are dry.     Pharynx: Oropharynx is clear.  Eyes:     General: No scleral icterus.    Extraocular Movements: Extraocular  movements intact.     Conjunctiva/sclera: Conjunctivae normal.     Pupils: Pupils are equal, round, and reactive to light.     Comments: Glasses. Brown eyes.   Cardiovascular:     Rate and Rhythm: Normal rate and  regular rhythm.     Heart sounds: Normal heart sounds. No murmur heard.   Pulmonary:     Effort: Pulmonary effort is normal. No respiratory distress.     Breath sounds: Normal breath sounds. No wheezing or rales.  Chest:     Chest wall: No tenderness.  Breasts:     Right: No supraclavicular adenopathy.     Left: No supraclavicular adenopathy.    Abdominal:     General: Bowel sounds are normal. There is no distension.     Palpations: Abdomen is soft. There is no hepatomegaly, splenomegaly or mass.     Tenderness: There is no abdominal tenderness. There is no guarding or rebound.  Musculoskeletal:        General: Tenderness (BLE) present. No swelling. Normal range of motion.     Cervical back: Normal range of motion and neck supple.     Right lower leg: No edema.     Left lower leg: No edema.  Lymphadenopathy:     Head:     Right side of head: No preauricular, posterior auricular or occipital adenopathy.     Left side of head: No preauricular, posterior auricular or occipital adenopathy.     Cervical: No cervical adenopathy.     Upper Body:     Right upper body: No supraclavicular adenopathy.     Left upper body: No supraclavicular adenopathy.  Skin:    General: Skin is warm and dry.     Coloration: Skin is not jaundiced or pale.     Findings: No erythema.  Neurological:     Mental Status: She is alert and oriented to person, place, and time. Mental status is at baseline.  Psychiatric:        Behavior: Behavior normal.        Thought Content: Thought content normal.        Judgment: Judgment normal.    Infusion on 06/19/2020  Component Date Value Ref Range Status  . Magnesium 06/19/2020 1.2* 1.7 - 2.4 mg/dL Final   Performed at [facility], [address], [place], [place] [address]  . Sodium 06/19/2020 137  135 - 145 mmol/L Final  . Potassium 06/19/2020 3.5  3.5 - 5.1 mmol/L Final  . Chloride 06/19/2020 107  98 - 111 mmol/L Final  . CO2 06/19/2020 21* 22  - 32 mmol/L Final  . Glucose, Bld 06/19/2020 156* 70 - 99 mg/dL Final   Glucose reference range applies only to samples taken after fasting for at least 8 hours.  . BUN 06/19/2020 16  8 - 23 mg/dL Final  . Creatinine, Ser 06/19/2020 0.87  0.44 - 1.00 mg/dL Final  . Calcium 06/19/2020 8.4* 8.9 - 10.3 mg/dL Final  . Total Protein 06/19/2020 6.0* 6.5 - 8.1 g/dL Final  . Albumin 06/19/2020 3.5  3.5 - 5.0 g/dL Final  . AST 06/19/2020 26  15 - 41 U/L Final  . ALT 06/19/2020 26  0 - 44 U/L Final  . Alkaline Phosphatase 06/19/2020 82  38 - 126 U/L Final  . Total Bilirubin 06/19/2020 0.6  0.3 - 1.2 mg/dL Final  . GFR, Estimated 06/19/2020 >60  >60 mL/min Final   Comment: (NOTE) Calculated using the CKD-EPI Creatinine Equation (  2021)   . Anion gap 06/19/2020 9  5 - 15 Final   Performed at [facility], [address], [place], [place] [address]  . WBC 06/19/2020 3.5* 4.0 - 10.5 K/uL Final  . RBC 06/19/2020 3.04* 3.87 - 5.11 MIL/uL Final  . Hemoglobin 06/19/2020 8.8* 12.0 - 15.0 g/dL Final  . HCT 06/19/2020 26.2* 36.0 - 46.0 % Final  . MCV 06/19/2020 86.2  80.0 - 100.0 fL Final  . MCH 06/19/2020 28.9  26.0 - 34.0 pg Final  . MCHC 06/19/2020 33.6  30.0 - 36.0 g/dL Final  . RDW 06/19/2020 19.1* 11.5 - 15.5 % Final  . Platelets 06/19/2020 120* 150 - 400 K/uL Final  . nRBC 06/19/2020 0.0  0.0 - 0.2 % Final  . Neutrophils Relative % 06/19/2020 53  % Final  . Neutro Abs 06/19/2020 1.9  1.7 - 7.7 K/uL Final  . Lymphocytes Relative 06/19/2020 36  % Final  . Lymphs Abs 06/19/2020 1.3  0.7 - 4.0 K/uL Final  . Monocytes Relative 06/19/2020 10  % Final  . Monocytes Absolute 06/19/2020 0.3  0.1 - 1.0 K/uL Final  . Eosinophils Relative 06/19/2020 0  % Final  . Eosinophils Absolute 06/19/2020 0.0  0.0 - 0.5 K/uL Final  . Basophils Relative 06/19/2020 0  % Final  . Basophils Absolute 06/19/2020 0.0  0.0 - 0.1 K/uL Final  . Immature Granulocytes 06/19/2020 1  % Final  . Abs Immature  Granulocytes 06/19/2020 0.02  0.00 - 0.07 K/uL Final   Performed at [facility], [address] [place], [place] [address]    Assessment:  Sarah [name] is a 63 y.o. 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 Bone marrow revealed 80% plasma cells. Lambda free light chains were 1340. She had nephrotic range proteinuria. She initially underwent induction with RVD. Revlimid maintenance was discontinued on 01/21/2017 secondary to intolerance.   Bone marrowaspirate andbiopsyon 01/18/2021revealed anormocellularmarrow withbut increased lambda-restricted plasma cells (9% aspirate, 40% CD138 immunohistochemistry).Findingswereconsistent with recurrent plasma cell myeloma.Flow cytometry revealed no monoclonal B-cell or phenotypically aberrant T-cell population. Cytogeneticswere 47, XX (normal). 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  M-spikehas been followed: 0 on 04/02/2016 -06/23/2019; 0.2 on 09/02/2017, 0.1 on 11/25/2019, 0.1 on 01/10/2020, 0.2 on 02/07/2020, 0.1 on 03/07/2020, 0 on 04/10/2020, and 06/19/2020.  Lambda light chainshave been followed: 22.2 (ratio 0.56) on 07/03/2017, 30.8 (ratio 0.78) on 09/02/2017, 36.9 (ratio 0.40) on 10/21/2017, 37.4 (ratio 0.41) on 12/16/2017, 70.7(ratio 0.31) on 02/17/2018, 64.2 (ratio 0.27) on 04/07/2018, 78.9 (ratio 0.18) on 05/26/2018, 128.8 (ratio 0.17) on 08/06/2018, 181.5 (ratio 0.13) on 10/08/2018, 130.9 (ratio 0.13) on 10/20/2018, 160.7 (ratio 0.10)on 12/09/2018, 236.6 (ratio 0.07) on 02/01/2019, 363.6 (ratio 0.04) on 03/22/2019, 404.8 (ratio 0.04) on 04/05/2019, 420.7 (ratio 0.03) on 05/24/2019, 573.4 (ratio 0.03) on 06/23/2019, 451.05(ratio 0.02) on02/19/2021, 47.2 (ratio 0.13) on 10/25/2019, 22.4 (ratio 0.21) on 11/25/2019, 16.5 (ratio 0.33) on 01/10/2020, 14.6 (ratio 0.34) on 02/07/2020, 13.1 (ratio  0.31) on 03/07/2020, 10.1 (ratio 0.38) on 04/10/2020, and 9.5 (ratio 0.21) on 06/19/2020.  24 hour UPEPon 06/03/2019 revealed kappa free light chains95.76,lambda free light chains1,260.71, andratio 0.08.24 hourUPEPon 02/22/2021revealed totalproteinof760m/24 hrs withlambdafree light chains 1,084.15mL andratioof0.10(1.03-31.76). M spike inurinewas46.1%(36163m4 hrs).  Bone surveyon 04/08/2016 and11/28/2018 revealed no definite lytic lesion seen in the visualized skeleton.Bone surveyon 11/19/2018 revealed no suspicious lucent lesionsand no acute bony abnormality.PET scanon 07/12/2019 revealed no focal metabolic activity to  suggest active myeloma within the skeleton. There wereno lytic lesions identified on the CT portion of the examorsoft tissue plasmacytomas. There was no evidence of multiple myeloma.  Pretreatment RBC phenotypeon 09/23/2019 waspositivefor C, e, DUFFY B, KIDD B, M, S, and s antigen; negativefor c, E, KELL, DUFFY A, KIDD A, and N antigen.  Shereceived 6 cycles of daratumumab and hyaluronidase-fihj, Pomalyst, and Decadron (DPd)(09/27/2019 - 10/25/2019; 12/09/2019 - 03/13/2020). Cycle #1 was complicated by fever and neutropenia requiring admission.  Cycle #2 was complicated by pneumonia requiring admission.  Cycle #6 was complicated with an ER evaluation for an elevated lactic acid.  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 Zometa was discontinued in 01/2017. She has chronic nauseaon Phenergan.  She has B12 deficiency. B12 was 254 on 04/09/2017, 295 on 08/20/2018, and 391 on 10/08/2018. Shewasonoral B12.She received B12 monthly (last10/05/2020).Folate was12.1on 01/10/2020.  She has iron deficiency. Ferritin was 32 on 07/01/2019. She received Venoferon 07/15/2019 and 07/22/2019.  She has hypogammaglobulinemia.  IgG was 245 on 11/25/2019.  She received monthly IVIG (07/22/021 -  03/22/2020).  She received IVIG 400 mg/kg on 01/20/2020, 200 mg/kg on 02/17/2020, and 300 mg/kg on 03/22/2020.  IVIG on 65/46/5035 was complicated by acute renal failure. IgG trough level was 418 on 02/17/2020.  She was admitted to [facility] 10/15/2019 - 10/20/2019 with fever and neutropenia. Cultures were negative. CXR was negative. She received broad spectrum antibiotics and daily Granix. She received IVF for acute renal insufficiency due to diarrhea and dehydration. Creatine was 1.66 on admission and 1.12 on discharge.  She was admitted to [facility] from 03/01/2020 - 03/05/2020 with fever and neutropenia. CXR revealed no active cardiopulmonary disease. Chest CT with contrast revealed no acute intrathoracic pathology. There were findings which could be suggestive of prior granulomatous disease. She was treated with Cefepime and Vancomycin, then switched to ciprofloxacin on 03/03/2020.   The patient was admitted to [facility] from 05/08/2020 - 06/04/2020 for autologous bone marrow transplant.   She received conditioning with melphalan 140 mg/m2.  Bone marrow transplant was on 05/10/2020. The patient developed fevers daily from 05/19/2020 - 05/24/2020. Blood cultures were + for strept sanguis bacteremia.  She was treated cefepime, vancomycin, and solumedrol for possible engraftment syndrome. Cefepime was switched to ceftriaxone; she completed 2 weeks of ceftriaxone on 06/03/2020. She had chemotherapy induced diarrhea.  She experienced urinary retention.  She received the [place] 09/20/2019 and 10/09/2019.  Symptomatically, she has been "getting there."  She is still a bit weak and shaky s/p tranplant. Her neuropathy is a little bit worse. She has diarrhea that is the consistency of oatmeal (C. diff negative).  Exam is stable.  Plan: 1.   Labs today: CBC with diff, CMP, Mg, SPEP, FLCA. 2. Stage III multiple myeloma  Symptomatically, she is slowly improving s/p second  transplant.  Lambda free light chains have improved from451.05on02/19/2021. After cycle #1: 47.2 (ratio 0.13). After cycle #6:10.1 (ratio 0.38).   After transplant: 9.5 (ratio 0.21).  She is day 40 s/p stem cell transplant #2 (05/10/2020).  Counts have recovered.  She is transfusion independent.  Discuss plans for maintenance Velcade every 2 weeks approximately 60 days s/p transplant.    Discuss symptom management.  She has antiemetics and pain medications at home to use on a prn bases.  Interventions are adequate.    3.   Normocytic anemia  Hematocrit 26.2. Hemoglobin 8.8. MCV 86.2 86.2 today.   Ferritin51 on 01/10/2020. TSH was normal on02/20/2020.  Creatinine0.87 today.  Continue to monitor. 4. Grade II peripheral neuropathy  Neuropathy appears slightly worse s/p transplant.  Continue to monitor closely especially with initiation of Velcade maintenance. 5.   Hypogammaglobulinemia             She has had recurrent infections.               Shereceived her first dose ofIVIG(25 gm)on 01/20/2020 with subsequent renal insufficiency. Shereceived her second dose of IVIG(15 gm)on 08/19/2021without renal insufficiency.   She received her third dose of IVIG (20 gm) on 03/22/2020 without renal insufficiency. He received her fourth dose of IVIG (20 gm) on 04/24/2020 without renal insufficiency. Goal IgGtrough level is 500.   Anticipate continuation of IVIG post transplant. 6.   Chronic hypomagnesemia Magnesium1.2 today. Magnesium 6 g IV today.  Continue magnesium checks twice weekly. 7.   Chronic nausea  Rx:  Phenergan 25 mg p.o. every 6 hours as needed nausea or vomiting (dispense #90; 0 refills).  Phenergan IV prn when patient is in clinic. 8.   24 hour UPEP and free light chains. 9.   Magnesium 6 gm IV  today. 10.   B12 monthly x 6 (next due 07/10/2020). 11.   RTC on Mondays and Thursdays x 4 weeks for labs (BMP, Mg) and +/- IV Mg. 12.   RTC in 4 weeks for MD assessment, labs (CBC with diff, CMP, Mg), and IV Mg.  I discussed the assessment and treatment plan with the patient.  The patient was provided an opportunity to ask questions and all were answered.  The patient agreed with the plan and demonstrated an understanding of the instructions.  The patient was advised to call back if the symptoms worsen or if the condition fails to improve as anticipated.   I provided 15 minutes of face-to-face time during this this encounter and > 50% was spent counseling as documented under my assessment and plan.  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    Lequita Asal, MD, PhD    06/19/2020, 11:29 AM   I, Mirian Mo Tufford, am acting as Education administrator for [employer]. Mike Gip, MD, PhD.  I, Melissa C. Mike Gip, MD, have reviewed the above documentation for accuracy and completeness, and I agree with the above.

## 2020-06-19 ENCOUNTER — Other Ambulatory Visit: Payer: Self-pay

## 2020-06-19 ENCOUNTER — Other Ambulatory Visit: Payer: Self-pay | Admitting: Hematology and Oncology

## 2020-06-19 ENCOUNTER — Inpatient Hospital Stay (HOSPITAL_BASED_OUTPATIENT_CLINIC_OR_DEPARTMENT_OTHER): Payer: Medicare PPO | Admitting: Hematology and Oncology

## 2020-06-19 ENCOUNTER — Inpatient Hospital Stay: Payer: Medicare PPO

## 2020-06-19 ENCOUNTER — Encounter: Payer: Self-pay | Admitting: Hematology and Oncology

## 2020-06-19 VITALS — BP 121/72 | HR 65 | Temp 97.0°F | Wt 137.9 lb

## 2020-06-19 DIAGNOSIS — Z7189 Other specified counseling: Secondary | ICD-10-CM

## 2020-06-19 DIAGNOSIS — C9000 Multiple myeloma not having achieved remission: Secondary | ICD-10-CM | POA: Diagnosis not present

## 2020-06-19 DIAGNOSIS — R112 Nausea with vomiting, unspecified: Secondary | ICD-10-CM

## 2020-06-19 DIAGNOSIS — I350 Nonrheumatic aortic (valve) stenosis: Secondary | ICD-10-CM | POA: Diagnosis not present

## 2020-06-19 DIAGNOSIS — D649 Anemia, unspecified: Secondary | ICD-10-CM

## 2020-06-19 DIAGNOSIS — R110 Nausea: Secondary | ICD-10-CM

## 2020-06-19 DIAGNOSIS — Z9484 Stem cells transplant status: Secondary | ICD-10-CM

## 2020-06-19 DIAGNOSIS — G620 Drug-induced polyneuropathy: Secondary | ICD-10-CM | POA: Diagnosis not present

## 2020-06-19 DIAGNOSIS — T451X5A Adverse effect of antineoplastic and immunosuppressive drugs, initial encounter: Secondary | ICD-10-CM

## 2020-06-19 DIAGNOSIS — Z79899 Other long term (current) drug therapy: Secondary | ICD-10-CM | POA: Diagnosis not present

## 2020-06-19 LAB — CBC WITH DIFFERENTIAL/PLATELET
Abs Immature Granulocytes: 0.02 10*3/uL (ref 0.00–0.07)
Basophils Absolute: 0 10*3/uL (ref 0.0–0.1)
Basophils Relative: 0 %
Eosinophils Absolute: 0 10*3/uL (ref 0.0–0.5)
Eosinophils Relative: 0 %
HCT: 26.2 % — ABNORMAL LOW (ref 36.0–46.0)
Hemoglobin: 8.8 g/dL — ABNORMAL LOW (ref 12.0–15.0)
Immature Granulocytes: 1 %
Lymphocytes Relative: 36 %
Lymphs Abs: 1.3 10*3/uL (ref 0.7–4.0)
MCH: 28.9 pg (ref 26.0–34.0)
MCHC: 33.6 g/dL (ref 30.0–36.0)
MCV: 86.2 fL (ref 80.0–100.0)
Monocytes Absolute: 0.3 10*3/uL (ref 0.1–1.0)
Monocytes Relative: 10 %
Neutro Abs: 1.9 10*3/uL (ref 1.7–7.7)
Neutrophils Relative %: 53 %
Platelets: 120 10*3/uL — ABNORMAL LOW (ref 150–400)
RBC: 3.04 MIL/uL — ABNORMAL LOW (ref 3.87–5.11)
RDW: 19.1 % — ABNORMAL HIGH (ref 11.5–15.5)
WBC: 3.5 10*3/uL — ABNORMAL LOW (ref 4.0–10.5)
nRBC: 0 % (ref 0.0–0.2)

## 2020-06-19 LAB — COMPREHENSIVE METABOLIC PANEL
ALT: 26 U/L (ref 0–44)
AST: 26 U/L (ref 15–41)
Albumin: 3.5 g/dL (ref 3.5–5.0)
Alkaline Phosphatase: 82 U/L (ref 38–126)
Anion gap: 9 (ref 5–15)
BUN: 16 mg/dL (ref 8–23)
CO2: 21 mmol/L — ABNORMAL LOW (ref 22–32)
Calcium: 8.4 mg/dL — ABNORMAL LOW (ref 8.9–10.3)
Chloride: 107 mmol/L (ref 98–111)
Creatinine, Ser: 0.87 mg/dL (ref 0.44–1.00)
GFR, Estimated: >60 mL/min (ref >60)
Glucose, Bld: 156 mg/dL — ABNORMAL HIGH (ref 70–99)
Potassium: 3.5 mmol/L (ref 3.5–5.1)
Sodium: 137 mmol/L (ref 135–145)
Total Bilirubin: 0.6 mg/dL (ref 0.3–1.2)
Total Protein: 6 g/dL — ABNORMAL LOW (ref 6.5–8.1)

## 2020-06-19 LAB — MAGNESIUM: Magnesium: 1.2 mg/dL — ABNORMAL LOW (ref 1.7–2.4)

## 2020-06-19 MED ORDER — PROMETHAZINE HCL 25 MG/ML IJ SOLN
25.0000 mg | Freq: Once | INTRAMUSCULAR | Status: AC
  Administered 2020-06-19: 25 mg via INTRAVENOUS

## 2020-06-19 MED ORDER — MAGNESIUM SULFATE 4 GM/100ML IV SOLN
4.0000 g | Freq: Once | INTRAVENOUS | Status: AC
  Administered 2020-06-19: 4 g via INTRAVENOUS
  Filled 2020-06-19: qty 100

## 2020-06-19 MED ORDER — SODIUM CHLORIDE 0.9% FLUSH
10.0000 mL | Freq: Once | INTRAVENOUS | Status: AC | PRN
  Administered 2020-06-19: 10 mL
  Filled 2020-06-19: qty 10

## 2020-06-19 MED ORDER — PROMETHAZINE HCL 25 MG PO TABS: ORAL_TABLET | ORAL | 0 refills | Status: DC

## 2020-06-19 MED ORDER — PROMETHAZINE HCL 25 MG PO TABS: 25.0000 mg | ORAL_TABLET | Freq: Four times a day (QID) | ORAL | 0 refills | Status: DC | PRN

## 2020-06-19 MED ORDER — HEPARIN SOD (PORK) LOCK FLUSH 100 UNIT/ML IV SOLN
500.0000 [IU] | Freq: Once | INTRAVENOUS | Status: AC | PRN
  Administered 2020-06-19: 500 [IU]
  Filled 2020-06-19: qty 5

## 2020-06-19 MED ORDER — SODIUM CHLORIDE 0.9 % IV SOLN
Freq: Once | INTRAVENOUS | Status: AC
  Filled 2020-06-19: qty 250

## 2020-06-19 MED ORDER — SODIUM CHLORIDE 0.9 % IV SOLN
6.0000 g | Freq: Once | INTRAVENOUS | Status: DC
  Filled 2020-06-19: qty 12

## 2020-06-19 MED ORDER — PROMETHAZINE HCL 25 MG/ML IJ SOLN
INTRAMUSCULAR | Status: AC
  Filled 2020-06-19: qty 1

## 2020-06-19 MED ORDER — MAGNESIUM SULFATE 2 GM/50ML IV SOLN
2.0000 g | Freq: Once | INTRAVENOUS | Status: AC
  Administered 2020-06-19: 2 g via INTRAVENOUS
  Filled 2020-06-19: qty 50

## 2020-06-19 NOTE — Progress Notes (Signed)
Patient received prescribed treatment in clinic. Tolerated well. Patient stable at discharge. 

## 2020-06-20 ENCOUNTER — Ambulatory Visit: Payer: Medicare PPO | Admitting: Student in an Organized Health Care Education/Training Program

## 2020-06-20 DIAGNOSIS — Z9484 Stem cells transplant status: Secondary | ICD-10-CM | POA: Diagnosis not present

## 2020-06-20 DIAGNOSIS — I493 Ventricular premature depolarization: Secondary | ICD-10-CM | POA: Diagnosis not present

## 2020-06-20 DIAGNOSIS — C9000 Multiple myeloma not having achieved remission: Secondary | ICD-10-CM | POA: Diagnosis not present

## 2020-06-20 DIAGNOSIS — I350 Nonrheumatic aortic (valve) stenosis: Secondary | ICD-10-CM | POA: Diagnosis not present

## 2020-06-20 DIAGNOSIS — Z7682 Awaiting organ transplant status: Secondary | ICD-10-CM | POA: Diagnosis not present

## 2020-06-20 DIAGNOSIS — C9001 Multiple myeloma in remission: Secondary | ICD-10-CM | POA: Diagnosis not present

## 2020-06-20 LAB — MULTIPLE MYELOMA PANEL, SERUM
Albumin SerPl Elph-Mcnc: 3.4 g/dL (ref 2.9–4.4)
Albumin/Glob SerPl: 1.6 (ref 0.7–1.7)
Alpha 1: 0.3 g/dL (ref 0.0–0.4)
Alpha2 Glob SerPl Elph-Mcnc: 0.9 g/dL (ref 0.4–1.0)
B-Globulin SerPl Elph-Mcnc: 0.8 g/dL (ref 0.7–1.3)
Gamma Glob SerPl Elph-Mcnc: 0.2 g/dL — ABNORMAL LOW (ref 0.4–1.8)
Globulin, Total: 2.2 g/dL (ref 2.2–3.9)
IgA: 13 mg/dL — ABNORMAL LOW (ref 87–352)
IgG (Immunoglobin G), Serum: 278 mg/dL — ABNORMAL LOW (ref 586–1602)
IgM (Immunoglobulin M), Srm: 6 mg/dL — ABNORMAL LOW (ref 26–217)
Total Protein ELP: 5.6 g/dL — ABNORMAL LOW (ref 6.0–8.5)

## 2020-06-20 LAB — KAPPA/LAMBDA LIGHT CHAINS
Kappa free light chain: 2 mg/L — ABNORMAL LOW (ref 3.3–19.4)
Kappa, lambda light chain ratio: 0.21 — ABNORMAL LOW (ref 0.26–1.65)
Lambda free light chains: 9.5 mg/L (ref 5.7–26.3)

## 2020-06-22 ENCOUNTER — Other Ambulatory Visit: Payer: Self-pay

## 2020-06-22 ENCOUNTER — Inpatient Hospital Stay: Payer: Medicare PPO

## 2020-06-22 VITALS — BP 110/69 | HR 71 | Temp 98.4°F | Resp 18

## 2020-06-22 DIAGNOSIS — T451X5A Adverse effect of antineoplastic and immunosuppressive drugs, initial encounter: Secondary | ICD-10-CM | POA: Diagnosis not present

## 2020-06-22 DIAGNOSIS — C9000 Multiple myeloma not having achieved remission: Secondary | ICD-10-CM

## 2020-06-22 DIAGNOSIS — G620 Drug-induced polyneuropathy: Secondary | ICD-10-CM | POA: Diagnosis not present

## 2020-06-22 DIAGNOSIS — Z79899 Other long term (current) drug therapy: Secondary | ICD-10-CM | POA: Diagnosis not present

## 2020-06-22 DIAGNOSIS — D649 Anemia, unspecified: Secondary | ICD-10-CM | POA: Diagnosis not present

## 2020-06-22 DIAGNOSIS — R110 Nausea: Secondary | ICD-10-CM

## 2020-06-22 LAB — BASIC METABOLIC PANEL
Anion gap: 10 (ref 5–15)
BUN: 14 mg/dL (ref 8–23)
CO2: 20 mmol/L — ABNORMAL LOW (ref 22–32)
Calcium: 9.1 mg/dL (ref 8.9–10.3)
Chloride: 106 mmol/L (ref 98–111)
Creatinine, Ser: 0.91 mg/dL (ref 0.44–1.00)
GFR, Estimated: >60 mL/min (ref >60)
Glucose, Bld: 124 mg/dL — ABNORMAL HIGH (ref 70–99)
Potassium: 4.2 mmol/L (ref 3.5–5.1)
Sodium: 136 mmol/L (ref 135–145)

## 2020-06-22 LAB — MAGNESIUM: Magnesium: 1.4 mg/dL — ABNORMAL LOW (ref 1.7–2.4)

## 2020-06-22 MED ORDER — PROMETHAZINE HCL 25 MG/ML IJ SOLN
25.0000 mg | Freq: Once | INTRAMUSCULAR | Status: AC
  Administered 2020-06-22: 25 mg via INTRAVENOUS
  Filled 2020-06-22: qty 1

## 2020-06-22 MED ORDER — SODIUM CHLORIDE 0.9% FLUSH
10.0000 mL | Freq: Once | INTRAVENOUS | Status: AC | PRN
  Administered 2020-06-22: 10 mL
  Filled 2020-06-22: qty 10

## 2020-06-22 MED ORDER — MAGNESIUM SULFATE 4 GM/100ML IV SOLN
4.0000 g | Freq: Once | INTRAVENOUS | Status: AC
  Administered 2020-06-22: 4 g via INTRAVENOUS
  Filled 2020-06-22: qty 100

## 2020-06-22 MED ORDER — HEPARIN SOD (PORK) LOCK FLUSH 100 UNIT/ML IV SOLN
500.0000 [IU] | Freq: Once | INTRAVENOUS | Status: AC | PRN
  Administered 2020-06-22: 500 [IU]
  Filled 2020-06-22: qty 5

## 2020-06-22 MED ORDER — SODIUM CHLORIDE 0.9 % IV SOLN
Freq: Once | INTRAVENOUS | Status: AC
Start: 2020-06-22 — End: 2020-06-22
  Filled 2020-06-22: qty 250

## 2020-06-22 MED ORDER — SODIUM CHLORIDE 0.9 % IV SOLN: Freq: Once | INTRAVENOUS | Status: DC

## 2020-06-22 NOTE — Progress Notes (Signed)
Patient received prescribed treatment in clinic. Tolerated well. Patient stable at discharge. 

## 2020-06-26 ENCOUNTER — Other Ambulatory Visit: Payer: Self-pay

## 2020-06-26 ENCOUNTER — Inpatient Hospital Stay: Payer: Medicare PPO

## 2020-06-26 DIAGNOSIS — G620 Drug-induced polyneuropathy: Secondary | ICD-10-CM | POA: Diagnosis not present

## 2020-06-26 DIAGNOSIS — R110 Nausea: Secondary | ICD-10-CM

## 2020-06-26 DIAGNOSIS — D649 Anemia, unspecified: Secondary | ICD-10-CM | POA: Diagnosis not present

## 2020-06-26 DIAGNOSIS — C9000 Multiple myeloma not having achieved remission: Secondary | ICD-10-CM

## 2020-06-26 DIAGNOSIS — Z79899 Other long term (current) drug therapy: Secondary | ICD-10-CM | POA: Diagnosis not present

## 2020-06-26 DIAGNOSIS — T451X5A Adverse effect of antineoplastic and immunosuppressive drugs, initial encounter: Secondary | ICD-10-CM | POA: Diagnosis not present

## 2020-06-26 LAB — BASIC METABOLIC PANEL
Anion gap: 9 (ref 5–15)
BUN: 18 mg/dL (ref 8–23)
CO2: 22 mmol/L (ref 22–32)
Calcium: 8.9 mg/dL (ref 8.9–10.3)
Chloride: 105 mmol/L (ref 98–111)
Creatinine, Ser: 1.07 mg/dL — ABNORMAL HIGH (ref 0.44–1.00)
GFR, Estimated: 58 mL/min — ABNORMAL LOW (ref >60)
Glucose, Bld: 137 mg/dL — ABNORMAL HIGH (ref 70–99)
Potassium: 4.1 mmol/L (ref 3.5–5.1)
Sodium: 136 mmol/L (ref 135–145)

## 2020-06-26 LAB — MAGNESIUM: Magnesium: 1.1 mg/dL — ABNORMAL LOW (ref 1.7–2.4)

## 2020-06-26 MED ORDER — SODIUM CHLORIDE 0.9 % IV SOLN
Freq: Once | INTRAVENOUS | Status: AC
  Filled 2020-06-26: qty 250

## 2020-06-26 MED ORDER — SODIUM CHLORIDE 0.9 % IV SOLN: 6.0000 g | Freq: Once | INTRAVENOUS | Status: DC

## 2020-06-26 MED ORDER — PROMETHAZINE HCL 25 MG/ML IJ SOLN
25.0000 mg | Freq: Four times a day (QID) | INTRAMUSCULAR | Status: DC | PRN
  Administered 2020-06-26: 25 mg via INTRAVENOUS
  Filled 2020-06-26: qty 1

## 2020-06-26 MED ORDER — MAGNESIUM SULFATE 4 GM/100ML IV SOLN
4.0000 g | Freq: Once | INTRAVENOUS | Status: AC
  Administered 2020-06-26: 4 g via INTRAVENOUS
  Filled 2020-06-26: qty 100

## 2020-06-26 MED ORDER — HEPARIN SOD (PORK) LOCK FLUSH 100 UNIT/ML IV SOLN
500.0000 [IU] | Freq: Once | INTRAVENOUS | Status: AC | PRN
  Administered 2020-06-26: 500 [IU]
  Filled 2020-06-26: qty 5

## 2020-06-26 MED ORDER — MAGNESIUM SULFATE 2 GM/50ML IV SOLN
2.0000 g | Freq: Once | INTRAVENOUS | Status: AC
  Administered 2020-06-26: 2 g via INTRAVENOUS
  Filled 2020-06-26: qty 50

## 2020-06-26 MED ORDER — SODIUM CHLORIDE 0.9% FLUSH
10.0000 mL | Freq: Once | INTRAVENOUS | Status: DC | PRN
  Filled 2020-06-26: qty 10

## 2020-06-26 NOTE — Progress Notes (Signed)
Patient received prescribed treatment in clinic. Tolerated well. Patient stable at discharge. 

## 2020-06-27 LAB — FREE K+L LT CHAINS,QN,UR
Free Kappa Lt Chains,Ur: 18.9 mg/L (ref 0.63–113.79)
Free Kappa/Lambda Ratio: 2.11 (ref 1.03–31.76)
Free Lambda Lt Chains,Ur: 8.96 mg/L (ref 0.47–11.77)
Total Volume: 800

## 2020-06-28 LAB — UPEP/TP, 24-HR URINE
Albumin, U: 25.9 %
Alpha 1, Urine: 7.9 %
Alpha 2, Urine: 23.6 %
Beta, Urine: 38.9 %
Gamma Globulin, Urine: 3.7 %
Total Protein, Urine-Ur/day: 102 mg/24 hr (ref 30–150)
Total Protein, Urine: 12.8 mg/dL
Total Volume: 800

## 2020-06-29 ENCOUNTER — Other Ambulatory Visit: Payer: Self-pay

## 2020-06-29 ENCOUNTER — Inpatient Hospital Stay: Payer: Medicare PPO

## 2020-06-29 DIAGNOSIS — T451X5A Adverse effect of antineoplastic and immunosuppressive drugs, initial encounter: Secondary | ICD-10-CM | POA: Diagnosis not present

## 2020-06-29 DIAGNOSIS — C9000 Multiple myeloma not having achieved remission: Secondary | ICD-10-CM

## 2020-06-29 DIAGNOSIS — D649 Anemia, unspecified: Secondary | ICD-10-CM | POA: Diagnosis not present

## 2020-06-29 DIAGNOSIS — Z79899 Other long term (current) drug therapy: Secondary | ICD-10-CM | POA: Diagnosis not present

## 2020-06-29 DIAGNOSIS — R110 Nausea: Secondary | ICD-10-CM

## 2020-06-29 DIAGNOSIS — G620 Drug-induced polyneuropathy: Secondary | ICD-10-CM | POA: Diagnosis not present

## 2020-06-29 LAB — BASIC METABOLIC PANEL
Anion gap: 11 (ref 5–15)
BUN: 19 mg/dL (ref 8–23)
CO2: 21 mmol/L — ABNORMAL LOW (ref 22–32)
Calcium: 9 mg/dL (ref 8.9–10.3)
Chloride: 103 mmol/L (ref 98–111)
Creatinine, Ser: 1.05 mg/dL — ABNORMAL HIGH (ref 0.44–1.00)
GFR, Estimated: 60 mL/min — ABNORMAL LOW (ref >60)
Glucose, Bld: 151 mg/dL — ABNORMAL HIGH (ref 70–99)
Potassium: 4 mmol/L (ref 3.5–5.1)
Sodium: 135 mmol/L (ref 135–145)

## 2020-06-29 LAB — MAGNESIUM: Magnesium: 1.3 mg/dL — ABNORMAL LOW (ref 1.7–2.4)

## 2020-06-29 MED ORDER — SODIUM CHLORIDE 0.9% FLUSH
10.0000 mL | Freq: Once | INTRAVENOUS | Status: AC
  Administered 2020-06-29: 10 mL via INTRAVENOUS
  Filled 2020-06-29: qty 10

## 2020-06-29 MED ORDER — HEPARIN SOD (PORK) LOCK FLUSH 100 UNIT/ML IV SOLN
500.0000 [IU] | Freq: Once | INTRAVENOUS | Status: AC
  Administered 2020-06-29: 500 [IU] via INTRAVENOUS
  Filled 2020-06-29: qty 5

## 2020-06-29 MED ORDER — SODIUM CHLORIDE 0.9 % IV SOLN: 6.0000 g | Freq: Once | INTRAVENOUS | Status: DC

## 2020-06-29 MED ORDER — MAGNESIUM SULFATE 2 GM/50ML IV SOLN
2.0000 g | Freq: Once | INTRAVENOUS | Status: AC
  Administered 2020-06-29: 2 g via INTRAVENOUS
  Filled 2020-06-29: qty 50

## 2020-06-29 MED ORDER — MAGNESIUM SULFATE 4 GM/100ML IV SOLN
4.0000 g | Freq: Once | INTRAVENOUS | Status: AC
  Administered 2020-06-29: 4 g via INTRAVENOUS
  Filled 2020-06-29: qty 100

## 2020-06-29 MED ORDER — SODIUM CHLORIDE 0.9 % IV SOLN
INTRAVENOUS | Status: DC
  Filled 2020-06-29: qty 250

## 2020-06-29 MED ORDER — PROMETHAZINE HCL 25 MG/ML IJ SOLN
25.0000 mg | Freq: Once | INTRAMUSCULAR | Status: AC
  Administered 2020-06-29: 25 mg via INTRAVENOUS
  Filled 2020-06-29: qty 1

## 2020-06-29 NOTE — Progress Notes (Signed)
Magnesium 6grams well tolerated. D/c home in stable condition.

## 2020-07-03 ENCOUNTER — Other Ambulatory Visit: Payer: Self-pay

## 2020-07-03 ENCOUNTER — Inpatient Hospital Stay: Payer: Medicare HMO

## 2020-07-03 ENCOUNTER — Inpatient Hospital Stay: Payer: Medicare HMO | Attending: Hematology and Oncology

## 2020-07-03 DIAGNOSIS — Z87891 Personal history of nicotine dependence: Secondary | ICD-10-CM | POA: Insufficient documentation

## 2020-07-03 DIAGNOSIS — E1122 Type 2 diabetes mellitus with diabetic chronic kidney disease: Secondary | ICD-10-CM | POA: Insufficient documentation

## 2020-07-03 DIAGNOSIS — Z818 Family history of other mental and behavioral disorders: Secondary | ICD-10-CM | POA: Insufficient documentation

## 2020-07-03 DIAGNOSIS — D801 Nonfamilial hypogammaglobulinemia: Secondary | ICD-10-CM | POA: Diagnosis not present

## 2020-07-03 DIAGNOSIS — D649 Anemia, unspecified: Secondary | ICD-10-CM | POA: Diagnosis not present

## 2020-07-03 DIAGNOSIS — Z8249 Family history of ischemic heart disease and other diseases of the circulatory system: Secondary | ICD-10-CM | POA: Insufficient documentation

## 2020-07-03 DIAGNOSIS — E538 Deficiency of other specified B group vitamins: Secondary | ICD-10-CM | POA: Insufficient documentation

## 2020-07-03 DIAGNOSIS — Z833 Family history of diabetes mellitus: Secondary | ICD-10-CM | POA: Diagnosis not present

## 2020-07-03 DIAGNOSIS — Z800 Family history of malignant neoplasm of digestive organs: Secondary | ICD-10-CM | POA: Insufficient documentation

## 2020-07-03 DIAGNOSIS — M549 Dorsalgia, unspecified: Secondary | ICD-10-CM | POA: Insufficient documentation

## 2020-07-03 DIAGNOSIS — Z803 Family history of malignant neoplasm of breast: Secondary | ICD-10-CM | POA: Insufficient documentation

## 2020-07-03 DIAGNOSIS — I129 Hypertensive chronic kidney disease with stage 1 through stage 4 chronic kidney disease, or unspecified chronic kidney disease: Secondary | ICD-10-CM | POA: Insufficient documentation

## 2020-07-03 DIAGNOSIS — Z832 Family history of diseases of the blood and blood-forming organs and certain disorders involving the immune mechanism: Secondary | ICD-10-CM | POA: Diagnosis not present

## 2020-07-03 DIAGNOSIS — Z841 Family history of disorders of kidney and ureter: Secondary | ICD-10-CM | POA: Diagnosis not present

## 2020-07-03 DIAGNOSIS — Z79899 Other long term (current) drug therapy: Secondary | ICD-10-CM | POA: Insufficient documentation

## 2020-07-03 DIAGNOSIS — Z808 Family history of malignant neoplasm of other organs or systems: Secondary | ICD-10-CM | POA: Insufficient documentation

## 2020-07-03 DIAGNOSIS — R110 Nausea: Secondary | ICD-10-CM | POA: Diagnosis not present

## 2020-07-03 DIAGNOSIS — N183 Chronic kidney disease, stage 3 unspecified: Secondary | ICD-10-CM | POA: Diagnosis not present

## 2020-07-03 DIAGNOSIS — Z801 Family history of malignant neoplasm of trachea, bronchus and lung: Secondary | ICD-10-CM | POA: Insufficient documentation

## 2020-07-03 DIAGNOSIS — R197 Diarrhea, unspecified: Secondary | ICD-10-CM | POA: Diagnosis not present

## 2020-07-03 DIAGNOSIS — I350 Nonrheumatic aortic (valve) stenosis: Secondary | ICD-10-CM | POA: Insufficient documentation

## 2020-07-03 DIAGNOSIS — C9000 Multiple myeloma not having achieved remission: Secondary | ICD-10-CM | POA: Diagnosis not present

## 2020-07-03 DIAGNOSIS — G620 Drug-induced polyneuropathy: Secondary | ICD-10-CM | POA: Diagnosis not present

## 2020-07-03 DIAGNOSIS — T451X5A Adverse effect of antineoplastic and immunosuppressive drugs, initial encounter: Secondary | ICD-10-CM | POA: Insufficient documentation

## 2020-07-03 DIAGNOSIS — Z9221 Personal history of antineoplastic chemotherapy: Secondary | ICD-10-CM | POA: Diagnosis not present

## 2020-07-03 DIAGNOSIS — Z822 Family history of deafness and hearing loss: Secondary | ICD-10-CM | POA: Insufficient documentation

## 2020-07-03 DIAGNOSIS — Z8379 Family history of other diseases of the digestive system: Secondary | ICD-10-CM | POA: Insufficient documentation

## 2020-07-03 LAB — BASIC METABOLIC PANEL
Anion gap: 11 (ref 5–15)
BUN: 22 mg/dL (ref 8–23)
CO2: 22 mmol/L (ref 22–32)
Calcium: 9.7 mg/dL (ref 8.9–10.3)
Chloride: 104 mmol/L (ref 98–111)
Creatinine, Ser: 1 mg/dL (ref 0.44–1.00)
GFR, Estimated: >60 mL/min (ref >60)
Glucose, Bld: 125 mg/dL — ABNORMAL HIGH (ref 70–99)
Potassium: 3.9 mmol/L (ref 3.5–5.1)
Sodium: 137 mmol/L (ref 135–145)

## 2020-07-03 LAB — MAGNESIUM: Magnesium: 1.2 mg/dL — ABNORMAL LOW (ref 1.7–2.4)

## 2020-07-03 MED ORDER — MAGNESIUM SULFATE 4 GM/100ML IV SOLN
4.0000 g | Freq: Once | INTRAVENOUS | Status: AC
  Administered 2020-07-03: 4 g via INTRAVENOUS
  Filled 2020-07-03: qty 100

## 2020-07-03 MED ORDER — HEPARIN SOD (PORK) LOCK FLUSH 100 UNIT/ML IV SOLN
500.0000 [IU] | Freq: Once | INTRAVENOUS | Status: AC | PRN
  Administered 2020-07-03: 500 [IU]
  Filled 2020-07-03: qty 5

## 2020-07-03 MED ORDER — MAGNESIUM SULFATE 2 GM/50ML IV SOLN
2.0000 g | Freq: Once | INTRAVENOUS | Status: AC
  Administered 2020-07-03: 2 g via INTRAVENOUS
  Filled 2020-07-03: qty 50

## 2020-07-03 MED ORDER — PROMETHAZINE HCL 25 MG/ML IJ SOLN
25.0000 mg | Freq: Once | INTRAMUSCULAR | Status: AC
  Administered 2020-07-03: 25 mg via INTRAVENOUS
  Filled 2020-07-03: qty 1

## 2020-07-03 MED ORDER — SODIUM CHLORIDE 0.9 % IV SOLN: 6.0000 g | Freq: Once | INTRAVENOUS | Status: DC

## 2020-07-03 MED ORDER — SODIUM CHLORIDE 0.9% FLUSH
10.0000 mL | Freq: Once | INTRAVENOUS | Status: AC | PRN
  Administered 2020-07-03: 10 mL
  Filled 2020-07-03: qty 10

## 2020-07-03 MED ORDER — SODIUM CHLORIDE 0.9 % IV SOLN
Freq: Once | INTRAVENOUS | Status: AC
  Filled 2020-07-03: qty 250

## 2020-07-03 NOTE — Progress Notes (Signed)
Patient received prescribed treatment in clinic; 6 grams of magnesium IV over 3 hours and 25 mg phenergan IV. Tolerated well. Patient stable at discharge.

## 2020-07-05 DIAGNOSIS — I350 Nonrheumatic aortic (valve) stenosis: Secondary | ICD-10-CM | POA: Diagnosis not present

## 2020-07-05 DIAGNOSIS — I493 Ventricular premature depolarization: Secondary | ICD-10-CM | POA: Diagnosis not present

## 2020-07-05 DIAGNOSIS — R7881 Bacteremia: Secondary | ICD-10-CM | POA: Diagnosis not present

## 2020-07-06 ENCOUNTER — Inpatient Hospital Stay: Payer: Medicare HMO

## 2020-07-06 ENCOUNTER — Other Ambulatory Visit: Payer: Self-pay

## 2020-07-06 DIAGNOSIS — N183 Chronic kidney disease, stage 3 unspecified: Secondary | ICD-10-CM | POA: Diagnosis not present

## 2020-07-06 DIAGNOSIS — E538 Deficiency of other specified B group vitamins: Secondary | ICD-10-CM | POA: Diagnosis not present

## 2020-07-06 DIAGNOSIS — R110 Nausea: Secondary | ICD-10-CM

## 2020-07-06 DIAGNOSIS — C9000 Multiple myeloma not having achieved remission: Secondary | ICD-10-CM | POA: Diagnosis not present

## 2020-07-06 DIAGNOSIS — E1122 Type 2 diabetes mellitus with diabetic chronic kidney disease: Secondary | ICD-10-CM | POA: Diagnosis not present

## 2020-07-06 DIAGNOSIS — D649 Anemia, unspecified: Secondary | ICD-10-CM | POA: Diagnosis not present

## 2020-07-06 DIAGNOSIS — D801 Nonfamilial hypogammaglobulinemia: Secondary | ICD-10-CM | POA: Diagnosis not present

## 2020-07-06 DIAGNOSIS — R197 Diarrhea, unspecified: Secondary | ICD-10-CM | POA: Diagnosis not present

## 2020-07-06 DIAGNOSIS — I129 Hypertensive chronic kidney disease with stage 1 through stage 4 chronic kidney disease, or unspecified chronic kidney disease: Secondary | ICD-10-CM | POA: Diagnosis not present

## 2020-07-06 LAB — MAGNESIUM: Magnesium: 1.5 mg/dL — ABNORMAL LOW (ref 1.7–2.4)

## 2020-07-06 LAB — BASIC METABOLIC PANEL
Anion gap: 10 (ref 5–15)
BUN: 19 mg/dL (ref 8–23)
CO2: 22 mmol/L (ref 22–32)
Calcium: 9.1 mg/dL (ref 8.9–10.3)
Chloride: 105 mmol/L (ref 98–111)
Creatinine, Ser: 1 mg/dL (ref 0.44–1.00)
GFR, Estimated: >60 mL/min (ref >60)
Glucose, Bld: 115 mg/dL — ABNORMAL HIGH (ref 70–99)
Potassium: 4.1 mmol/L (ref 3.5–5.1)
Sodium: 137 mmol/L (ref 135–145)

## 2020-07-06 MED ORDER — MAGNESIUM SULFATE 2 GM/50ML IV SOLN
2.0000 g | Freq: Once | INTRAVENOUS | Status: AC
  Administered 2020-07-06: 2 g via INTRAVENOUS
  Filled 2020-07-06: qty 50

## 2020-07-06 MED ORDER — SODIUM CHLORIDE 0.9% FLUSH
10.0000 mL | Freq: Once | INTRAVENOUS | Status: AC | PRN
  Administered 2020-07-06: 10 mL
  Filled 2020-07-06: qty 10

## 2020-07-06 MED ORDER — SODIUM CHLORIDE 0.9 % IV SOLN
Freq: Once | INTRAVENOUS | Status: AC
  Filled 2020-07-06: qty 250

## 2020-07-06 MED ORDER — HEPARIN SOD (PORK) LOCK FLUSH 100 UNIT/ML IV SOLN
500.0000 [IU] | Freq: Once | INTRAVENOUS | Status: AC | PRN
  Administered 2020-07-06: 500 [IU]
  Filled 2020-07-06: qty 5

## 2020-07-06 MED ORDER — PROMETHAZINE HCL 25 MG/ML IJ SOLN
25.0000 mg | Freq: Four times a day (QID) | INTRAMUSCULAR | Status: DC | PRN
  Administered 2020-07-06: 25 mg via INTRAVENOUS
  Filled 2020-07-06: qty 1

## 2020-07-06 NOTE — Progress Notes (Signed)
Patient received prescribed treatment in clinic. (IV magnesium and phenergan). Tolerated well. Patient stable at discharge.

## 2020-07-10 ENCOUNTER — Inpatient Hospital Stay: Payer: Medicare HMO

## 2020-07-10 ENCOUNTER — Other Ambulatory Visit: Payer: Self-pay

## 2020-07-10 DIAGNOSIS — I129 Hypertensive chronic kidney disease with stage 1 through stage 4 chronic kidney disease, or unspecified chronic kidney disease: Secondary | ICD-10-CM | POA: Diagnosis not present

## 2020-07-10 DIAGNOSIS — E1122 Type 2 diabetes mellitus with diabetic chronic kidney disease: Secondary | ICD-10-CM | POA: Diagnosis not present

## 2020-07-10 DIAGNOSIS — R110 Nausea: Secondary | ICD-10-CM

## 2020-07-10 DIAGNOSIS — Z95828 Presence of other vascular implants and grafts: Secondary | ICD-10-CM

## 2020-07-10 DIAGNOSIS — N183 Chronic kidney disease, stage 3 unspecified: Secondary | ICD-10-CM | POA: Diagnosis not present

## 2020-07-10 DIAGNOSIS — D649 Anemia, unspecified: Secondary | ICD-10-CM | POA: Diagnosis not present

## 2020-07-10 DIAGNOSIS — C9000 Multiple myeloma not having achieved remission: Secondary | ICD-10-CM | POA: Diagnosis not present

## 2020-07-10 DIAGNOSIS — D801 Nonfamilial hypogammaglobulinemia: Secondary | ICD-10-CM | POA: Diagnosis not present

## 2020-07-10 DIAGNOSIS — E538 Deficiency of other specified B group vitamins: Secondary | ICD-10-CM | POA: Diagnosis not present

## 2020-07-10 DIAGNOSIS — R197 Diarrhea, unspecified: Secondary | ICD-10-CM | POA: Diagnosis not present

## 2020-07-10 LAB — BASIC METABOLIC PANEL
Anion gap: 12 (ref 5–15)
BUN: 28 mg/dL — ABNORMAL HIGH (ref 8–23)
CO2: 23 mmol/L (ref 22–32)
Calcium: 9.2 mg/dL (ref 8.9–10.3)
Chloride: 102 mmol/L (ref 98–111)
Creatinine, Ser: 1.1 mg/dL — ABNORMAL HIGH (ref 0.44–1.00)
GFR, Estimated: 56 mL/min — ABNORMAL LOW (ref >60)
Glucose, Bld: 126 mg/dL — ABNORMAL HIGH (ref 70–99)
Potassium: 3.9 mmol/L (ref 3.5–5.1)
Sodium: 137 mmol/L (ref 135–145)

## 2020-07-10 LAB — MAGNESIUM: Magnesium: 1.3 mg/dL — ABNORMAL LOW (ref 1.7–2.4)

## 2020-07-10 MED ORDER — HEPARIN SOD (PORK) LOCK FLUSH 100 UNIT/ML IV SOLN
500.0000 [IU] | Freq: Once | INTRAVENOUS | Status: AC
  Administered 2020-07-10: 500 [IU] via INTRAVENOUS
  Filled 2020-07-10: qty 5

## 2020-07-10 MED ORDER — SODIUM CHLORIDE 0.9% FLUSH
10.0000 mL | Freq: Once | INTRAVENOUS | Status: AC
  Administered 2020-07-10: 10 mL via INTRAVENOUS
  Filled 2020-07-10: qty 10

## 2020-07-10 MED ORDER — SODIUM CHLORIDE 0.9 % IV SOLN
Freq: Once | INTRAVENOUS | Status: AC
  Filled 2020-07-10: qty 250

## 2020-07-10 MED ORDER — MAGNESIUM SULFATE 4 GM/100ML IV SOLN
4.0000 g | Freq: Once | INTRAVENOUS | Status: AC
  Administered 2020-07-10: 4 g via INTRAVENOUS
  Filled 2020-07-10: qty 100

## 2020-07-10 MED ORDER — PROMETHAZINE HCL 25 MG/ML IJ SOLN
25.0000 mg | Freq: Once | INTRAMUSCULAR | Status: AC
  Administered 2020-07-10: 25 mg via INTRAVENOUS
  Filled 2020-07-10: qty 1

## 2020-07-10 NOTE — Progress Notes (Signed)
Patient received prescribed treatment in clinic. 4 grams IV magnesium over 2 hours. IV phenergan 25 mg x 1.  Tolerated well. Patient stable at discharge.

## 2020-07-11 ENCOUNTER — Other Ambulatory Visit: Payer: Self-pay | Admitting: Internal Medicine

## 2020-07-11 DIAGNOSIS — E1122 Type 2 diabetes mellitus with diabetic chronic kidney disease: Secondary | ICD-10-CM

## 2020-07-11 DIAGNOSIS — M47816 Spondylosis without myelopathy or radiculopathy, lumbar region: Secondary | ICD-10-CM

## 2020-07-11 DIAGNOSIS — N182 Chronic kidney disease, stage 2 (mild): Secondary | ICD-10-CM

## 2020-07-11 NOTE — Telephone Encounter (Signed)
Requested medication (s) are due for refill today:   Yes  Requested medication (s) are on the active medication list:   Yes  Future visit scheduled:   No   Last ordered: Metformin 01/28/2020, #90, 0 refills                        Robaxin 06/14/2020 #20, 0 refills    Non delegated refill   Requested Prescriptions  Pending Prescriptions Disp Refills   metFORMIN (GLUCOPHAGE-XR) 500 MG 24 hr tablet [Pharmacy Med Name: METFORMIN HYDROCHLORIDE ER 500 MG Tablet Extended Release 24 Hour] 90 tablet 1    Sig: TAKE 1 [place]      Endocrinology:  Diabetes - Biguanides Failed - 07/11/2020  2:03 PM      Failed - Cr in normal range and within 360 days    Creatinine, Ser  Date Value Ref Range Status  07/10/2020 1.10 (H) 0.44 - 1.00 mg/dL Final          Failed - eGFR in normal range and within 360 days    GFR calc Af Amer  Date Value Ref Range Status  03/27/2020 >60 >60 mL/min Final   GFR, Estimated  Date Value Ref Range Status  07/10/2020 56 (L) >60 mL/min Final    Comment:    (NOTE) Calculated using the CKD-EPI Creatinine Equation (2021)           Passed - HBA1C is between 0 and 7.9 and within 180 days    Hgb A1c MFr Bld  Date Value Ref Range Status  03/01/2020 5.9 (H) 4.8 - 5.6 % Final    Comment:    (NOTE) Pre diabetes:          5.7%-6.4%  Diabetes:              >6.4%  Glycemic control for   <7.0% adults with diabetes           Passed - Valid encounter within last 6 months    Recent Outpatient Visits           3 months ago Benign essential HTN   [facility] Glean Hess, MD   7 months ago Annual physical exam   [facility] Glean Hess, MD   8 months ago Benign essential HTN   [place], [place]   10 months ago Acute non-recurrent sinusitis, unspecified location   [facility] Glean Hess, MD   1 year ago Fullness of supraclavicular fossa   [facility] Glean Hess, MD       Future Appointments             In 4 months [facility] Mel[facility] Jesse Sans, MD [facility]               methocarbamol (ROBAXIN) 500 MG tablet [Pharmacy Med Name: METHOCARBAMOL 500 MG Tablet] 120 tablet 0    Sig: TAKE 1 TABLET FOUR TIMES DAILY.      Not Delegated - Analgesics:  Muscle Relaxants Failed - 07/11/2020  2:03 PM      Failed - This refill cannot be delegated      Passed - Valid encounter within last 6 months    Recent Outpatient Visits           3 months ago Benign essential HTN   [place], MD   7 months ago  Annual physical exam   [facility] Glean Hess, MD   8 months ago Benign essential HTN   [place], MD   10 months ago Acute non-recurrent sinusitis, unspecified location   [facility] Glean Hess, MD   1 year ago Fullness of supraclavicular fossa   [facility] Glean Hess, MD       Future Appointments             In 4 months [facility] Melia Jesse Sans, MD [facility], [facility]

## 2020-07-13 ENCOUNTER — Other Ambulatory Visit: Payer: Self-pay

## 2020-07-13 ENCOUNTER — Inpatient Hospital Stay: Payer: Medicare HMO

## 2020-07-13 DIAGNOSIS — C9000 Multiple myeloma not having achieved remission: Secondary | ICD-10-CM | POA: Diagnosis not present

## 2020-07-13 DIAGNOSIS — E538 Deficiency of other specified B group vitamins: Secondary | ICD-10-CM | POA: Diagnosis not present

## 2020-07-13 DIAGNOSIS — N183 Chronic kidney disease, stage 3 unspecified: Secondary | ICD-10-CM | POA: Diagnosis not present

## 2020-07-13 DIAGNOSIS — I129 Hypertensive chronic kidney disease with stage 1 through stage 4 chronic kidney disease, or unspecified chronic kidney disease: Secondary | ICD-10-CM | POA: Diagnosis not present

## 2020-07-13 DIAGNOSIS — D801 Nonfamilial hypogammaglobulinemia: Secondary | ICD-10-CM | POA: Diagnosis not present

## 2020-07-13 DIAGNOSIS — R110 Nausea: Secondary | ICD-10-CM | POA: Diagnosis not present

## 2020-07-13 DIAGNOSIS — E1122 Type 2 diabetes mellitus with diabetic chronic kidney disease: Secondary | ICD-10-CM | POA: Diagnosis not present

## 2020-07-13 DIAGNOSIS — R197 Diarrhea, unspecified: Secondary | ICD-10-CM | POA: Diagnosis not present

## 2020-07-13 DIAGNOSIS — D649 Anemia, unspecified: Secondary | ICD-10-CM | POA: Diagnosis not present

## 2020-07-13 LAB — COMPREHENSIVE METABOLIC PANEL
ALT: 20 U/L (ref 0–44)
AST: 29 U/L (ref 15–41)
Albumin: 4.4 g/dL (ref 3.5–5.0)
Alkaline Phosphatase: 78 U/L (ref 38–126)
Anion gap: 13 (ref 5–15)
BUN: 16 mg/dL (ref 8–23)
CO2: 21 mmol/L — ABNORMAL LOW (ref 22–32)
Calcium: 9.1 mg/dL (ref 8.9–10.3)
Chloride: 104 mmol/L (ref 98–111)
Creatinine, Ser: 1.07 mg/dL — ABNORMAL HIGH (ref 0.44–1.00)
GFR, Estimated: 58 mL/min — ABNORMAL LOW (ref >60)
Glucose, Bld: 122 mg/dL — ABNORMAL HIGH (ref 70–99)
Potassium: 3.9 mmol/L (ref 3.5–5.1)
Sodium: 138 mmol/L (ref 135–145)
Total Bilirubin: 0.2 mg/dL — ABNORMAL LOW (ref 0.3–1.2)
Total Protein: 6.8 g/dL (ref 6.5–8.1)

## 2020-07-13 LAB — CBC WITH DIFFERENTIAL/PLATELET
Abs Immature Granulocytes: 0.01 10*3/uL (ref 0.00–0.07)
Basophils Absolute: 0 10*3/uL (ref 0.0–0.1)
Basophils Relative: 1 %
Eosinophils Absolute: 0.1 10*3/uL (ref 0.0–0.5)
Eosinophils Relative: 2 %
HCT: 31.5 % — ABNORMAL LOW (ref 36.0–46.0)
Hemoglobin: 10.5 g/dL — ABNORMAL LOW (ref 12.0–15.0)
Immature Granulocytes: 0 %
Lymphocytes Relative: 26 %
Lymphs Abs: 1.5 10*3/uL (ref 0.7–4.0)
MCH: 28.8 pg (ref 26.0–34.0)
MCHC: 33.3 g/dL (ref 30.0–36.0)
MCV: 86.5 fL (ref 80.0–100.0)
Monocytes Absolute: 0.6 10*3/uL (ref 0.1–1.0)
Monocytes Relative: 10 %
Neutro Abs: 3.7 10*3/uL (ref 1.7–7.7)
Neutrophils Relative %: 61 %
Platelets: 133 10*3/uL — ABNORMAL LOW (ref 150–400)
RBC: 3.64 MIL/uL — ABNORMAL LOW (ref 3.87–5.11)
RDW: 15.3 % (ref 11.5–15.5)
WBC: 6 10*3/uL (ref 4.0–10.5)
nRBC: 0 % (ref 0.0–0.2)

## 2020-07-13 LAB — MAGNESIUM: Magnesium: 1.2 mg/dL — ABNORMAL LOW (ref 1.7–2.4)

## 2020-07-13 MED ORDER — SODIUM CHLORIDE 0.9 % IV SOLN: 6.0000 g | Freq: Once | INTRAVENOUS | Status: DC

## 2020-07-13 MED ORDER — MAGNESIUM SULFATE 2 GM/50ML IV SOLN
2.0000 g | Freq: Once | INTRAVENOUS | Status: AC
  Administered 2020-07-13: 2 g via INTRAVENOUS
  Filled 2020-07-13: qty 50

## 2020-07-13 MED ORDER — HEPARIN SOD (PORK) LOCK FLUSH 100 UNIT/ML IV SOLN
500.0000 [IU] | Freq: Once | INTRAVENOUS | Status: AC | PRN
  Administered 2020-07-13: 500 [IU]
  Filled 2020-07-13: qty 5

## 2020-07-13 MED ORDER — SODIUM CHLORIDE 0.9% FLUSH
10.0000 mL | Freq: Once | INTRAVENOUS | Status: AC
  Administered 2020-07-13: 10 mL via INTRAVENOUS
  Filled 2020-07-13: qty 10

## 2020-07-13 MED ORDER — MAGNESIUM SULFATE 4 GM/100ML IV SOLN
4.0000 g | Freq: Once | INTRAVENOUS | Status: AC
  Administered 2020-07-13: 4 g via INTRAVENOUS
  Filled 2020-07-13: qty 100

## 2020-07-13 MED ORDER — HEPARIN SOD (PORK) LOCK FLUSH 100 UNIT/ML IV SOLN
INTRAVENOUS | Status: AC
  Filled 2020-07-13: qty 5

## 2020-07-13 MED ORDER — SODIUM CHLORIDE 0.9 % IV SOLN
Freq: Once | INTRAVENOUS | Status: AC
  Filled 2020-07-13: qty 250

## 2020-07-13 NOTE — Progress Notes (Incomplete)
[facility]  [address], [address] [place], [place] [address] Phone: [phone]  Fax: [phone]   Clinic Day:  07/13/2020   Referring physician: Glean Hess, MD  Chief Complaint: Sarah [name] is a 65 y.o. 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  HPI: The patient was last seen in the medical oncology clinic on 06/19/2020.  At that time, she was "getting there."  She was still a bit weak and shaky s/p tranplant. Her neuropathy was a little bit worse. She had diarrhea that was the consistency of oatmeal (C. diff negative).  Exam is stable. Hematocrit was 26.2, hemoglobin 8.8, MCV 86.2, platelets 120,000, WBC 3,500 (ANC 1,900). 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 Kappa free light chains were 2.0, lambda free light chains 9.5, ratio 0.21. She received 6 gm IV magnesium.  Labs followed: 06/22/2020: Magnesium 1.4. CO2 20. 06/26/2020: Magnesium 1.1. Creatinine 1.07 (CrCl 58 ml/min). 06/29/2020: Magnesium 1.3. Creatinine 1.05 (CrCl 60 ml/min). CO2 21.  07/03/2020: Magnesium 1.2. 07/06/2020: Magnesium 1.5. 07/10/2020: Magnesium 1.3. Creatinine 1.10 (CrCl 56 ml/min). BUN 28. 07/13/2020: Magnesium 1.2. Creatinine 1.07 (CrCl 58 ml/min). CO2 21. Bilirubin 0.2.  Additional labs: 06/26/2020: UPEP was normal. Urine kappa free light chains 18.90, lambda free light chains 8.96, ratio 2.11. 07/13/2020: Hematocrit 31.5, hemoglobin 10.5, MCV 86.5, platelets 133,000, WBC 6,000.  She received 4 gm IV magnesium on 06/22/2020, 07/10/2020, and 07/13/2020. She received 6 gm IV magnesium on 06/26/2020, 06/29/2020, and 07/03/2020. She received 2 gm IV magnesium on 07/06/2020.  During the interim, ***   Past Medical History:  Diagnosis Date  . Abnormal stress test 02/14/2016   Overview:  Added automatically from request for surgery [id number]  . Anemia   . Anxiety   .  Arthritis   . Bicuspid aortic valve   . CHF (congestive heart failure) ([place])   . CKD (chronic kidney disease) stage 3, GFR 30-59 ml/min (HCC)   . Depression   . Diabetes mellitus ([place])   . Dizziness   . Fatty liver   . Frequent falls   . GERD (gastroesophageal reflux disease)   . Gout   . Heart murmur   . History of blood transfusion   . History of bone marrow transplant ([place])   . History of uterine fibroid   . Hx of cardiac catheterization 06/05/2016   Overview:  Normal coronaries 2017  . Hypertension   . Hypomagnesemia   . Multiple myeloma ([place])   . Personal history of chemotherapy   . Renal cyst     Past Surgical History:  Procedure Laterality Date  . ABDOMINAL HYSTERECTOMY    . 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 CARDIAC ELECTROPHYSIOLOGY MAPPING AND ABLATION    . CARPAL TUNNEL RELEASE Bilateral   . CHOLECYSTECTOMY  2008  . COLONOSCOPY WITH PROPOFOL N/A 05/07/2017   Procedure: COLONOSCOPY WITH PROPOFOL;  Surgeon: [name], MD;  Location: [facility] ENDOSCOPY;  Service: Gastroenterology;  Laterality: N/A;  . ESOPHAGOGASTRODUODENOSCOPY (EGD) WITH PROPOFOL N/A 05/07/2017   Procedure: ESOPHAGOGASTRODUODENOSCOPY (EGD) WITH PROPOFOL;  Surgeon: [name], MD;  Location: [facility] ENDOSCOPY;  Service: Gastroenterology;  Laterality: N/A;  . FOOT SURGERY Bilateral   . INCONTINENCE SURGERY  2009  . INTERSTIM IMPLANT PLACEMENT    . other     over active bladder  . OTHER SURGICAL HISTORY     bladder stimulator   . PARTIAL HYSTERECTOMY  03/1996   fibroids  .  PORTA CATH INSERTION N/A 03/10/2019   Procedure: PORTA CATH INSERTION;  Surgeon: Algernon Huxley, MD;  Location: [facility] CV LAB;  Service: Cardiovascular;  Laterality: N/A;  . TONSILLECTOMY  2007    Family History  Problem Relation Age of Onset  . Colon cancer Father   . Renal Disease Father   . Diabetes Mellitus II Father   . Melanoma Paternal Grandmother   . Breast cancer Maternal Aunt 10  . Anemia Mother   . Heart  disease Mother   . Heart failure Mother   . Renal Disease Mother   . Congestive Heart Failure Mother   . Heart disease Maternal Uncle   . Throat cancer Maternal Uncle   . Lung cancer Maternal Uncle   . Liver disease Maternal Uncle   . Heart failure Maternal Uncle   . Hearing loss Son 21       Suicide     Social History:  reports that she quit smoking about 29 years ago. Her smoking use included cigarettes. She has a 20.00 pack-year smoking history. She has never used smokeless tobacco. She reports current alcohol use. She reports that she does not use drugs. Patient has not had ETOH in several months. She is on disability. She notes exposure to perchloroethylene [facility]).She lives much of her adult life in [place]. She was married with 2 sons. Her husband passed away. Her 2 sons took their own lives (age 32 and 57). She worked in [employer] in [employer]. She went back to school and earned an [employer] and works for [employer] for several years.[place] lives[place] sistersin [place]. She has two dogs, Harley and Brilliant. The patient is alone*** today.   Allergies:  Allergies  Allergen Reactions  . Oxycodone-Acetaminophen Anaphylaxis    Swelling and rash  . Celebrex [Celecoxib] Diarrhea  . Codeine   . Plerixafor     In 2016 during ASCT collection patient developed fever to 103.55F and required hospitalization  . Benadryl [Diphenhydramine] Palpitations  . Morphine Itching and Rash  . Ondansetron Diarrhea  . Tylenol [Acetaminophen] Itching and Rash    Current Medications: Current Outpatient Medications  Medication Sig Dispense Refill  . acetaminophen (TYLENOL) 500 MG tablet Take 500 mg by mouth every 6 (six) hours as needed.     Marland Kitchen acyclovir (ZOVIRAX) 400 MG tablet Take 1 tablet (400 mg total) by mouth 2 (two) times daily. 60 tablet 11  . allopurinol (ZYLOPRIM) 100 MG tablet TAKE 1 TABLET(100 MG) BY MOUTH DAILY as needed 90 tablet 1  . aspirin 81 MG chewable  tablet Chew 1 tablet (81 mg total) by mouth daily. 30 tablet 0  . dexamethasone (DECADRON) 4 MG tablet Take 5 tablets ($RemoveBe'20mg'PVwoBSznD$ ) on days 2, 16 of cycle 3,4, 5 and 6 Take 5 tablets ($RemoveBe'20mg'hXJArbcUG$ ) on days 2 of cycles 7 and beyond (Patient not taking: No sig reported) 50 tablet 0  . diclofenac sodium (VOLTAREN) 1 % GEL Apply 2 g topically 4 (four) times daily. 100 g 1  . diphenhydrAMINE (BENADRYL) 50 MG capsule Take 50 mg by mouth as needed.  (Patient not taking: No sig reported)    . diphenoxylate-atropine (LOMOTIL) 2.5-0.025 MG tablet Take 2 tablets by mouth daily as needed for diarrhea or loose stools. 45 tablet 2  . fentaNYL (DURAGESIC - DOSED MCG/HR) 25 MCG/HR patch Place 25 mcg onto the skin every 3 (three) days.     . fexofenadine (ALLEGRA) 180 MG tablet TAKE 1 TABLET(180 MG) BY MOUTH DAILY (  Patient not taking: No sig reported) 30 tablet 5  . FLUoxetine (PROZAC) 40 MG capsule TAKE 1 CAPSULE EVERY DAY 90 capsule 1  . fluticasone (FLONASE) 50 MCG/ACT nasal spray Place 2 sprays into both nostrils daily. 16 g 2  . glucose blood test strip     . lactulose (CHRONULAC) 10 GM/15ML solution Take 20 g by mouth daily as needed.     . lidocaine-prilocaine (EMLA) cream APPLY TOPICALLY AS NEEDED. 30 g 0  . metFORMIN (GLUCOPHAGE-XR) 500 MG 24 hr tablet TAKE 1 TABLET EVERY DAY WITH BREAKFAST 90 tablet 1  . methocarbamol (ROBAXIN) 500 MG tablet TAKE 1 TABLET FOUR TIMES DAILY. 120 tablet 0  . montelukast (SINGULAIR) 10 MG tablet Take 1 tablet by mouth on the day before treatment and for 2 days after treatment. (Patient not taking: No sig reported) 30 tablet 0  . Multiple Vitamin (MULTIVITAMIN) tablet Take 1 tablet by mouth daily.    . Multiple Vitamins-Minerals (HAIR/SKIN/NAILS) TABS Take 1 tablet by mouth daily.    Marland Kitchen omeprazole (PRILOSEC) 40 MG capsule TAKE 1 CAPSULE EVERY DAY 90 capsule 3  . promethazine (PHENERGAN) 25 MG tablet Take 1 tablet (25 mg total) by mouth every 6 (six) hours as needed for nausea or vomiting. 90  tablet 0  . promethazine (PHENERGAN) 25 MG tablet TAKE 1 TABLET BY MOUTH TWICE DAILY BEFORE MEALS AS NEEDED 60 tablet 0  . promethazine-dextromethorphan (PROMETHAZINE-DM) 6.25-15 MG/5ML syrup Take 5 mLs by mouth 4 (four) times daily as needed for cough. 240 mL 0  . tiZANidine (ZANAFLEX) 4 MG tablet Take 1 tablet (4 mg total) by mouth every 8 (eight) hours as needed for muscle spasms. (Patient not taking: No sig reported) 270 tablet 1  . traZODone (DESYREL) 100 MG tablet TAKE 1 TABLET AT BEDTIME  FOR  SLEEP 90 tablet 0   No current facility-administered medications for this visit.   Facility-Administered Medications Ordered in Other Visits  Medication Dose Route Frequency Provider Last Rate Last Admin  . 0.9 %  sodium chloride infusion   Intravenous Continuous Lequita Asal, MD   Stopped at 01/20/20 1232  . 0.9 %  sodium chloride infusion   Intravenous Once Corcoran, Melissa C, MD      . heparin lock flush 100 unit/mL  500 Units Intravenous Once Faythe Casa E, NP      . magnesium sulfate 6 g in sodium chloride 0.9 % 500 mL  6 g Intravenous Once Corcoran, Melissa C, MD      . sodium chloride flush (NS) 0.9 % injection 10 mL  10 mL Intravenous PRN Lequita Asal, MD   10 mL at 02/07/20 0815  . sodium chloride flush (NS) 0.9 % injection 10 mL  10 mL Intracatheter PRN Cammie Sickle, MD   10 mL at 02/10/20 0815  . sodium chloride flush (NS) 0.9 % injection 10 mL  10 mL Intravenous PRN Jacquelin Hawking, NP   10 mL at 03/01/20 1143    Review of Systems  Constitutional: Positive for weight loss (8 lbs). Negative for chills, diaphoresis, fever and malaise/fatigue.       Feels like she is "getting there"  HENT: Negative.  Negative for congestion, ear discharge, ear pain, hearing loss, nosebleeds, sinus pain, sore throat and tinnitus.   Eyes: Negative.  Negative for blurred vision.  Respiratory: Negative for cough, hemoptysis, sputum production, shortness of breath and wheezing.    Cardiovascular: Negative.  Negative for chest pain, palpitations and leg  swelling.       Heart "skipping around."  Gastrointestinal: Positive for diarrhea (oatmeal consistency) and nausea (chronic). Negative for abdominal pain, blood in stool, constipation, heartburn (on Prilosec), melena and vomiting.  Genitourinary: Negative.  Negative for dysuria, frequency and urgency.  Musculoskeletal: Positive for back pain (chronic, 6/10). Negative for falls, joint pain, myalgias and neck pain.  Skin: Negative.  Negative for itching and rash.  Neurological: Positive for sensory change (chronic numbness in fingers and toes (worse) w/ burning toe pain) and weakness (generalized, s/p transplant). Negative for dizziness, tingling and headaches.       Shaky s/p transplant  Endo/Heme/Allergies: Negative for environmental allergies. Does not bruise/bleed easily.       Diabetes, well controlled.  Psychiatric/Behavioral: Negative.  Negative for depression and memory loss. The patient is not nervous/anxious and does not have insomnia.   All other systems reviewed and are negative.   Performance status (ECOG): 1***  Vitals There were no vitals taken for this visit.  Physical Exam Vitals and nursing note reviewed.  Constitutional:      General: She is not in acute distress.    Appearance: She is well-developed. She is not ill-appearing, toxic-appearing or diaphoretic.     Interventions: Face mask in place.  HENT:     Head: Normocephalic and atraumatic.     Comments: Short wig.    Mouth/Throat:     Mouth: Mucous membranes are dry.     Pharynx: Oropharynx is clear.  Eyes:     General: No scleral icterus.    Extraocular Movements: Extraocular movements intact.     Conjunctiva/sclera: Conjunctivae normal.     Pupils: Pupils are equal, round, and reactive to light.     Comments: Glasses. Brown eyes.   Cardiovascular:     Rate and Rhythm: Normal rate and regular rhythm.     Heart sounds: Normal heart  sounds. No murmur heard.   Pulmonary:     Effort: Pulmonary effort is normal. No respiratory distress.     Breath sounds: Normal breath sounds. No wheezing or rales.  Chest:     Chest wall: No tenderness.  Breasts:     Right: No supraclavicular adenopathy.     Left: No supraclavicular adenopathy.    Abdominal:     General: Bowel sounds are normal. There is no distension.     Palpations: Abdomen is soft. There is no hepatomegaly, splenomegaly or mass.     Tenderness: There is no abdominal tenderness. There is no guarding or rebound.  Musculoskeletal:        General: Tenderness (BLE) present. No swelling. Normal range of motion.     Cervical back: Normal range of motion and neck supple.     Right lower leg: No edema.     Left lower leg: No edema.  Lymphadenopathy:     Head:     Right side of head: No preauricular, posterior auricular or occipital adenopathy.     Left side of head: No preauricular, posterior auricular or occipital adenopathy.     Cervical: No cervical adenopathy.     Upper Body:     Right upper body: No supraclavicular adenopathy.     Left upper body: No supraclavicular adenopathy.  Skin:    General: Skin is warm and dry.     Coloration: Skin is not jaundiced or pale.     Findings: No erythema.  Neurological:     Mental Status: She is alert and oriented to person, place, and time.  Mental status is at baseline.  Psychiatric:        Behavior: Behavior normal.        Thought Content: Thought content normal.        Judgment: Judgment normal.    No visits with results within 3 Day(s) from this visit.  Latest known visit with results is:  Infusion on 07/13/2020  Component Date Value Ref Range Status  . WBC 07/13/2020 6.0  4.0 - 10.5 K/uL Final  . RBC 07/13/2020 3.64* 3.87 - 5.11 MIL/uL Final  . Hemoglobin 07/13/2020 10.5* 12.0 - 15.0 g/dL Final  . HCT 07/13/2020 31.5* 36.0 - 46.0 % Final  . MCV 07/13/2020 86.5  80.0 - 100.0 fL Final  . MCH 07/13/2020 28.8   26.0 - 34.0 pg Final  . MCHC 07/13/2020 33.3  30.0 - 36.0 g/dL Final  . RDW 07/13/2020 15.3  11.5 - 15.5 % Final  . Platelets 07/13/2020 133* 150 - 400 K/uL Final  . nRBC 07/13/2020 0.0  0.0 - 0.2 % Final  . Neutrophils Relative % 07/13/2020 61  % Final  . Neutro Abs 07/13/2020 3.7  1.7 - 7.7 K/uL Final  . Lymphocytes Relative 07/13/2020 26  % Final  . Lymphs Abs 07/13/2020 1.5  0.7 - 4.0 K/uL Final  . Monocytes Relative 07/13/2020 10  % Final  . Monocytes Absolute 07/13/2020 0.6  0.1 - 1.0 K/uL Final  . Eosinophils Relative 07/13/2020 2  % Final  . Eosinophils Absolute 07/13/2020 0.1  0.0 - 0.5 K/uL Final  . Basophils Relative 07/13/2020 1  % Final  . Basophils Absolute 07/13/2020 0.0  0.0 - 0.1 K/uL Final  . Immature Granulocytes 07/13/2020 0  % Final  . Abs Immature Granulocytes 07/13/2020 0.01  0.00 - 0.07 K/uL Final   Performed at [facility], [address] [place], [place] [address]  . Sodium 07/13/2020 138  135 - 145 mmol/L Final  . Potassium 07/13/2020 3.9  3.5 - 5.1 mmol/L Final  . Chloride 07/13/2020 104  98 - 111 mmol/L Final  . CO2 07/13/2020 21* 22 - 32 mmol/L Final  . Glucose, Bld 07/13/2020 122* 70 - 99 mg/dL Final   Glucose reference range applies only to samples taken after fasting for at least 8 hours.  . BUN 07/13/2020 16  8 - 23 mg/dL Final  . Creatinine, Ser 07/13/2020 1.07* 0.44 - 1.00 mg/dL Final  . Calcium 07/13/2020 9.1  8.9 - 10.3 mg/dL Final  . Total Protein 07/13/2020 6.8  6.5 - 8.1 g/dL Final  . Albumin 07/13/2020 4.4  3.5 - 5.0 g/dL Final  . AST 07/13/2020 29  15 - 41 U/L Final  . ALT 07/13/2020 20  0 - 44 U/L Final  . Alkaline Phosphatase 07/13/2020 78  38 - 126 U/L Final  . Total Bilirubin 07/13/2020 0.2* 0.3 - 1.2 mg/dL Final  . GFR, Estimated 07/13/2020 58* >60 mL/min Final   Comment: (NOTE) Calculated using the CKD-EPI Creatinine Equation (2021)   . Anion gap 07/13/2020 13  5 - 15 Final   Performed at [facility], [address] [place], [place] [address]  . Magnesium 07/13/2020 1.2* 1.7 - 2.4 mg/dL Final   Performed at [facility], [address] [place], [place] [address]    Assessment:  Karine Garn is a 65 y.o. 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 Bone marrow  revealed 80% plasma cells. Lambda free light chains were 1340. She had nephrotic range proteinuria. She initially underwent induction with RVD. Revlimid maintenance was discontinued on 01/21/2017 secondary to intolerance.   Bone marrowaspirate andbiopsyon 01/18/2021revealed anormocellularmarrow withbut increased lambda-restricted plasma cells (9% aspirate, 40% CD138 immunohistochemistry).Findingswereconsistent with recurrent plasma cell myeloma.Flow cytometry revealed no monoclonal B-cell or phenotypically aberrant T-cell population. Cytogeneticswere 53, XX (normal). 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  M-spikehas been followed: 0 on 04/02/2016 -06/23/2019; 0.2 on 09/02/2017, 0.1 on 11/25/2019, 0.1 on 01/10/2020, 0.2 on 02/07/2020, 0.1 on 03/07/2020, 0 on 04/10/2020, and 06/19/2020.  Lambda light chainshave been followed: 22.2 (ratio 0.56) on 07/03/2017, 30.8 (ratio 0.78) on 09/02/2017, 36.9 (ratio 0.40) on 10/21/2017, 37.4 (ratio 0.41) on 12/16/2017, 70.7(ratio 0.31) on 02/17/2018, 64.2 (ratio 0.27) on 04/07/2018, 78.9 (ratio 0.18) on 05/26/2018, 128.8 (ratio 0.17) on 08/06/2018, 181.5 (ratio 0.13) on 10/08/2018, 130.9 (ratio 0.13) on 10/20/2018, 160.7 (ratio 0.10)on 12/09/2018, 236.6 (ratio 0.07) on 02/01/2019, 363.6 (ratio 0.04) on 03/22/2019, 404.8 (ratio 0.04) on 04/05/2019, 420.7 (ratio 0.03) on 05/24/2019, 573.4 (ratio 0.03) on 06/23/2019, 451.05(ratio 0.02) on02/19/2021, 47.2 (ratio 0.13) on 10/25/2019, 22.4 (ratio 0.21) on 11/25/2019, 16.5 (ratio 0.33) on 01/10/2020, 14.6  (ratio 0.34) on 02/07/2020, 13.1 (ratio 0.31) on 03/07/2020, 10.1 (ratio 0.38) on 04/10/2020, and 9.5 (ratio 0.21) on 06/19/2020.  24 hour UPEPon 06/03/2019 revealed kappa free light chains95.76,lambda free light chains1,260.71, andratio 0.08.24 hourUPEPon 02/22/2021revealed totalproteinof782mg /24 hrs withlambdafree light chains 1,084.16mg /L andratioof0.10(1.03-31.76). M spike inurinewas46.1%(361mg /24 hrs).  Bone surveyon 04/08/2016 and11/28/2018 revealed no definite lytic lesion seen in the visualized skeleton.Bone surveyon 11/19/2018 revealed no suspicious lucent lesionsand no acute bony abnormality.PET scanon 07/12/2019 revealed no focal metabolic activity to suggest active myeloma within the skeleton. There wereno lytic lesions identified on the CT portion of the examorsoft tissue plasmacytomas. There was no evidence of multiple myeloma.  Pretreatment RBC phenotypeon 09/23/2019 waspositivefor C, e, DUFFY B, KIDD B, M, S, and s antigen; negativefor c, E, KELL, DUFFY A, KIDD A, and N antigen.  Shereceived 6 cycles of daratumumab and hyaluronidase-fihj, Pomalyst, and Decadron (DPd)(09/27/2019 - 10/25/2019; 12/09/2019 - 03/13/2020). Cycle #1 was complicated by fever and neutropenia requiring admission.  Cycle #2 was complicated by pneumonia requiring admission.  Cycle #6 was complicated with an ER evaluation for an elevated lactic acid.  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 Zometa was discontinued in 01/2017. She has chronic nauseaon Phenergan.  She has B12 deficiency. B12 was 254 on 04/09/2017, 295 on 08/20/2018, and 391 on 10/08/2018. Shewasonoral B12.She received B12 monthly (last10/05/2020).Folate was12.1on 01/10/2020.  She has iron deficiency. Ferritin was 32 on 07/01/2019. She received Venoferon 07/15/2019 and 07/22/2019.  She has hypogammaglobulinemia.  IgG was 245 on 11/25/2019.  She  received monthly IVIG (07/22/021 - 03/22/2020).  She received IVIG 400 mg/kg on 01/20/2020, 200 mg/kg on 02/17/2020, and 300 mg/kg on 03/22/2020.  IVIG on 04/54/0981 was complicated by acute renal failure. IgG trough level was 418 on 02/17/2020.  She was admitted to [facility] 10/15/2019 - 10/20/2019 with fever and neutropenia. Cultures were negative. CXR was negative. She received broad spectrum antibiotics and daily Granix. She received IVF for acute renal insufficiency due to diarrhea and dehydration. Creatine was 1.66 on admission and 1.12 on discharge.  She was admitted to [facility] from 03/01/2020 - 03/05/2020 with fever and neutropenia. CXR revealed no active cardiopulmonary disease. Chest CT with contrast revealed no acute intrathoracic pathology. There were findings which could be suggestive of prior granulomatous disease. She was  treated with Cefepime and Vancomycin, then switched to ciprofloxacin on 03/03/2020.   The patient was admitted to [facility] from 05/08/2020 - 06/04/2020 for autologous bone marrow transplant.   She received conditioning with melphalan 140 mg/m2.  Bone marrow transplant was on 05/10/2020. The patient developed fevers daily from 05/19/2020 - 05/24/2020. Blood cultures were + for strept sanguis bacteremia.  She was treated cefepime, vancomycin, and solumedrol for possible engraftment syndrome. Cefepime was switched to ceftriaxone; she completed 2 weeks of ceftriaxone on 06/03/2020. She had chemotherapy induced diarrhea.  She experienced urinary retention.  She received the [place] 09/20/2019 and 10/09/2019.  Symptomatically, ***  Plan: 1.   Labs today: CBC with diff, CMP, Mg   2. Stage III multiple myeloma  Symptomatically, she is slowly improving s/p second transplant.  Lambda free light chains have improved from451.05on02/19/2021. After cycle #1: 47.2 (ratio 0.13). After cycle #6:10.1  (ratio 0.38).   After transplant: 9.5 (ratio 0.21).  She is day 40 s/p stem cell transplant #2 (05/10/2020).  Counts have recovered.  She is transfusion independent.  Discuss plans for maintenance Velcade every 2 weeks approximately 60 days s/p transplant.    Discuss symptom management.  She has antiemetics and pain medications at home to use on a prn bases.  Interventions are adequate.    3.   Normocytic anemia  Hematocrit 26.2. Hemoglobin 8.8. MCV 86.2 86.2 today.   Ferritin51 on 01/10/2020. TSH was normal on02/20/2020.  Creatinine0.87 today.  Continue to monitor. 4. Grade II peripheral neuropathy  Neuropathy appears slightly worse s/p transplant.  Continue to monitor closely especially with initiation of Velcade maintenance. 5.   Hypogammaglobulinemia             She has had recurrent infections.               Shereceived her first dose ofIVIG(25 gm)on 01/20/2020 with subsequent renal insufficiency. Shereceived her second dose of IVIG(15 gm)on 08/19/2021without renal insufficiency.   She received her third dose of IVIG (20 gm) on 03/22/2020 without renal insufficiency. He received her fourth dose of IVIG (20 gm) on 04/24/2020 without renal insufficiency. Goal IgGtrough level is 500.   Anticipate continuation of IVIG post transplant. 6.   Chronic hypomagnesemia Magnesium1.2 today. Magnesium 6 g IV today.  Continue magnesium checks twice weekly. 7.   Chronic nausea  Rx:  Phenergan 25 mg p.o. every 6 hours as needed nausea or vomiting (dispense #90; 0 refills).  Phenergan IV prn when patient is in clinic. 8.   24 hour UPEP and free light chains. 9.   Magnesium 6 gm IV today. 10.   B12 monthly x 6 (next due 07/10/2020). 11.   RTC on Mondays and Thursdays x 4 weeks for labs (BMP, Mg) and +/- IV Mg. 12.   RTC in 4 weeks for MD assessment, labs (CBC with  diff, CMP, Mg), and IV Mg.  I discussed the assessment and treatment plan with the patient.  The patient was provided an opportunity to ask questions and all were answered.  The patient agreed with the plan and demonstrated an understanding of the instructions.  The patient was advised to call back if the symptoms worsen or if the condition fails to improve as anticipated.   I provided *** minutes of face-to-face time during this this encounter and > 50% was spent counseling as documented under my assessment and plan.  Lequita Asal, MD, PhD    07/13/2020, 8:56 AM   I, Mirian Mo Tufford, am  acting as Education administrator for [employer]. Mike Gip, MD, PhD.  I, Melissa C. Mike Gip, MD, have reviewed the above documentation for accuracy and completeness, and I agree with the above.

## 2020-07-13 NOTE — Progress Notes (Signed)
Pt received 6g of IV Mg in clinic today. Tolerated well.

## 2020-07-17 ENCOUNTER — Inpatient Hospital Stay: Payer: Medicare HMO

## 2020-07-17 ENCOUNTER — Inpatient Hospital Stay: Payer: Medicare HMO | Admitting: Hematology and Oncology

## 2020-07-17 NOTE — Progress Notes (Signed)
[facility]  [address] [place], [place] [address] Phone: [phone]  Fax: [phone]   Clinic Day:  07/18/2020   Referring physician: Glean Hess, MD  Chief Complaint: Kaliann Coryell is a 65 y.o. 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  HPI: The patient was last seen in the medical oncology clinic on 06/19/2020.  At that time, she was "getting there."  She was still a bit weak and shaky s/p tranplant. Her neuropathy was a little bit worse. She had diarrhea that was the consistency of oatmeal (C. diff negative).  Exam is stable. Hematocrit was 26.2, hemoglobin 8.8, MCV 86.2, platelets 120,000, WBC 3,500 (ANC 1,900). M-spike was 0. Kappa free light chains were 2.0, lambda free light chains 9.5, and ratio 0.21 (0.26-1.65). She received 6 gm IV magnesium.  She has had her magnesium checked twice weekly.  Magnesium has ranged between 1.1-1.5. Creatinine has ranged between 1.05-1.07.  She has received 4-6 gm of magnesium except for 2 gm on 07/06/2020 when her magnesium was 1.5.  Additional labs: 06/26/2020: UPEP was normal. Urine kappa free light chains 18.90, lambda free light chains 8.96, and ratio 2.11 (normal). 07/13/2020: Hematocrit 31.5, hemoglobin 10.5, MCV 86.5, platelets 133,000, and WBC 6,000 with an ANC 3700.  She was seen by [facility] cardiology on 07/05/2020.  Echo on 07/05/2020 revealed moderate to severe AS with an EF of 50-55%.  Symptomatically, she noted no exertional dyspnea, heart failure symptoms, chest pain or presyncope/syncope.  Plan was for follow-up in 6 months with repeat echo.  During the interim, she has been "much better." She is getting her energy back and has been able to do some light housework. Her shakiness has resolved.   Her nausea is stable but she has not been vomiting. Her stools are loose.  She still has chronic back pain and neuropathy. She has a neuropathy in her toes; her neuropathy in her fingertips goes from her fingertips to halfway up her forearm.  She states that the numbness in her forearm is "old".  She denies any cardiac symptoms.  She has an appointment today at [facility] early this afternoon.  She is unable to stay for IV magnesium.  She would like a magnesium pump.    Past Medical History:  Diagnosis Date  . Abnormal stress test 02/14/2016   Overview:  Added automatically from request for surgery [id number]  . Anemia   . Anxiety   . Arthritis   . Bicuspid aortic valve   . CHF (congestive heart failure) ([place])   . CKD (chronic kidney disease) stage 3, GFR 30-59 ml/min (HCC)   . Depression   . Diabetes mellitus ([place])   . Dizziness   . Fatty liver   . Frequent falls   . GERD (gastroesophageal reflux disease)   . Gout   . Heart murmur   . History of blood transfusion   . History of bone marrow transplant ([place])   . History of uterine fibroid   . Hx of cardiac catheterization 06/05/2016   Overview:  Normal coronaries 2017  . Hypertension   . Hypomagnesemia   . Multiple myeloma ([place])   . Personal history of chemotherapy   . Renal cyst     Past Surgical History:  Procedure Laterality Date  . ABDOMINAL HYSTERECTOMY    . 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 CARDIAC ELECTROPHYSIOLOGY MAPPING  AND ABLATION    . CARPAL TUNNEL RELEASE Bilateral   . CHOLECYSTECTOMY  2008  . COLONOSCOPY WITH PROPOFOL N/A 05/07/2017   Procedure: COLONOSCOPY WITH PROPOFOL;  Surgeon: [name], MD;  Location[facility] END[facility]COPY;  Service: Gastroenterology;  Laterality: N/A;  . ESOPHAGOGASTRODUODENOSCOPY (EGD) WITH PROPOFOL N/A 05/07/2017   Procedure: ESOPHAGOGASTRODUODENOSCOPY (EGD) WITH PROPOFOL;  Surgeon: [name], MD;  Location: [facility] ENDOSCOPY;  Service: Gastroenterology;  Laterality: N/A;  . FOOT SURGERY Bilateral   . INCONTINENCE SURGERY  2009  . INTERSTIM IMPLANT PLACEMENT    . other      over active bladder  . OTHER SURGICAL HISTORY     bladder stimulator   . PARTIAL HYSTERECTOMY  03/1996   fibroids  . PORTA CATH INSERTION N/A 03/10/2019   Procedure: PORTA CATH INSERTION;  Surgeon: Algernon Huxley, MD;  Location: [facility] CV LAB;  Service: Cardiovascular;  Laterality: N/A;  . TONSILLECTOMY  2007    Family History  Problem Relation Age of Onset  . Colon cancer Father   . Renal Disease Father   . Diabetes Mellitus II Father   . Melanoma Paternal Grandmother   . Breast cancer Maternal Aunt 64  . Anemia Mother   . Heart disease Mother   . Heart failure Mother   . Renal Disease Mother   . Congestive Heart Failure Mother   . Heart disease Maternal Uncle   . Throat cancer Maternal Uncle   . Lung cancer Maternal Uncle   . Liver disease Maternal Uncle   . Heart failure Maternal Uncle   . Hearing loss Son 33       Suicide     Social History:  reports that she quit smoking about 29 years ago. Her smoking use included cigarettes. She has a 20.00 pack-year smoking history. She has never used smokeless tobacco. She reports current alcohol use. She reports that she does not use drugs. Patient has not had ETOH in several months. She is on disability. She notes exposure to perchloroethylene [facility]).She lives much of her adult life in [place]. She was married with 2 sons. Her husband passed away. Her 2 sons took their own lives (age 75 and 67). She worked in [employer] in [employer]. She went back to school and earned an [facility] and works for [employer] for several years.[place] sistersin [place]. She has two dogs, Harley and [place]. The patient is alone today.   Allergies:  Allergies  Allergen Reactions  . Oxycodone-Acetaminophen Anaphylaxis    Swelling and rash  . Celebrex [Celecoxib] Diarrhea  . Codeine   . Plerixafor     In 2016 during ASCT collection patient developed fever to 103.31F and required hospitalization  . Benadryl  [Diphenhydramine] Palpitations  . Morphine Itching and Rash  . Ondansetron Diarrhea  . Tylenol [Acetaminophen] Itching and Rash    Current Medications: Current Outpatient Medications  Medication Sig Dispense Refill  . acetaminophen (TYLENOL) 500 MG tablet Take 500 mg by mouth every 6 (six) hours as needed.     Marland Kitchen acyclovir (ZOVIRAX) 400 MG tablet Take 1 tablet (400 mg total) by mouth 2 (two) times daily. 60 tablet 11  . allopurinol (ZYLOPRIM) 100 MG tablet TAKE 1 TABLET(100 MG) BY MOUTH DAILY as needed 90 tablet 1  . bisoprolol (ZEBETA) 5 MG tablet Take by mouth.    . dexamethasone (DECADRON) 4 MG tablet Take 5 tablets (55m) on days 2, 16 of cycle 3,4, 5 and 6 Take 5 tablets (269m  on days 2 of cycles 7 and beyond 50 tablet 0  . diclofenac sodium (VOLTAREN) 1 % GEL Apply 2 g topically 4 (four) times daily. 100 g 1  . diphenoxylate-atropine (LOMOTIL) 2.5-0.025 MG tablet Take 2 tablets by mouth daily as needed for diarrhea or loose stools. 45 tablet 2  . fexofenadine (ALLEGRA) 180 MG tablet TAKE 1 TABLET(180 MG) BY MOUTH DAILY 30 tablet 5  . FLUoxetine (PROZAC) 40 MG capsule TAKE 1 CAPSULE EVERY DAY 90 capsule 1  . fluticasone (FLONASE) 50 MCG/ACT nasal spray Place 2 sprays into both nostrils daily. 16 g 2  . glucose blood test strip     . lactulose (CHRONULAC) 10 GM/15ML solution Take 20 g by mouth daily as needed.     . lidocaine-prilocaine (EMLA) cream APPLY TOPICALLY AS NEEDED. 30 g 0  . methocarbamol (ROBAXIN) 500 MG tablet TAKE 1 TABLET FOUR TIMES DAILY. 120 tablet 0  . montelukast (SINGULAIR) 10 MG tablet Take 1 tablet by mouth on the day before treatment and for 2 days after treatment. 30 tablet 0  . Multiple Vitamin (MULTIVITAMIN) tablet Take 1 tablet by mouth daily.    . Multiple Vitamins-Minerals (HAIR/SKIN/NAILS) TABS Take 1 tablet by mouth daily.    Marland Kitchen omeprazole (PRILOSEC) 40 MG capsule TAKE 1 CAPSULE EVERY DAY 90 capsule 3  . promethazine (PHENERGAN) 25 MG tablet Take 1 tablet  (25 mg total) by mouth every 6 (six) hours as needed for nausea or vomiting. 90 tablet 0  . promethazine (PHENERGAN) 25 MG tablet TAKE 1 TABLET BY MOUTH TWICE DAILY BEFORE MEALS AS NEEDED 60 tablet 0  . promethazine-dextromethorphan (PROMETHAZINE-DM) 6.25-15 MG/5ML syrup Take 5 mLs by mouth 4 (four) times daily as needed for cough. 240 mL 0  . tiZANidine (ZANAFLEX) 4 MG tablet Take 1 tablet (4 mg total) by mouth every 8 (eight) hours as needed for muscle spasms. 270 tablet 1  . traZODone (DESYREL) 100 MG tablet TAKE 1 TABLET AT BEDTIME  FOR  SLEEP 90 tablet 0  . aspirin 81 MG chewable tablet Chew 1 tablet (81 mg total) by mouth daily. (Patient not taking: Reported on 07/18/2020) 30 tablet 0  . diphenhydrAMINE (BENADRYL) 50 MG capsule Take 50 mg by mouth as needed.  (Patient not taking: No sig reported)    . fentaNYL (DURAGESIC - DOSED MCG/HR) 25 MCG/HR patch Place 25 mcg onto the skin every 3 (three) days.  (Patient not taking: Reported on 07/18/2020)    . metFORMIN (GLUCOPHAGE-XR) 500 MG 24 hr tablet TAKE 1 TABLET EVERY DAY WITH BREAKFAST (Patient not taking: Reported on 07/18/2020) 90 tablet 1   No current facility-administered medications for this visit.   Facility-Administered Medications Ordered in Other Visits  Medication Dose Route Frequency Provider Last Rate Last Admin  . 0.9 %  sodium chloride infusion   Intravenous Continuous Lequita Asal, MD   Stopped at 01/20/20 1232  . heparin lock flush 100 unit/mL  500 Units Intravenous Once Faythe Casa E, NP      . sodium chloride flush (NS) 0.9 % injection 10 mL  10 mL Intravenous PRN Lequita Asal, MD   10 mL at 02/07/20 0815  . sodium chloride flush (NS) 0.9 % injection 10 mL  10 mL Intracatheter PRN Cammie Sickle, MD   10 mL at 02/10/20 0815  . sodium chloride flush (NS) 0.9 % injection 10 mL  10 mL Intravenous PRN Jacquelin Hawking, NP   10 mL at 03/01/20 1143  Review of Systems  Constitutional: Negative for chills,  diaphoresis, fever, malaise/fatigue and weight loss (up 5 lbs).       Feels "much better." Energy is coming back.  HENT: Negative.  Negative for congestion, ear discharge, ear pain, hearing loss, nosebleeds, sinus pain, sore throat and tinnitus.   Eyes: Negative.  Negative for blurred vision.  Respiratory: Negative for cough, hemoptysis, sputum production, shortness of breath and wheezing.   Cardiovascular: Negative.  Negative for chest pain, palpitations and leg swelling.  Gastrointestinal: Positive for nausea (chronic, stable). Negative for abdominal pain, blood in stool, constipation, diarrhea (loose stools), heartburn (on Prilosec), melena and vomiting.  Genitourinary: Negative.  Negative for dysuria, frequency and urgency.  Musculoskeletal: Positive for back pain (chronic, 6/10). Negative for falls, joint pain, myalgias and neck pain.  Skin: Negative.  Negative for itching and rash.  Neurological: Positive for sensory change (chronic numbness in toes and from fingertips to halfway up forearm). Negative for dizziness, tingling, weakness and headaches.  Endo/Heme/Allergies: Negative for environmental allergies. Does not bruise/bleed easily.       Diabetes, well controlled.  Psychiatric/Behavioral: Negative.  Negative for depression and memory loss. The patient is not nervous/anxious and does not have insomnia.   All other systems reviewed and are negative.   Performance status (ECOG): 1  Vitals Blood pressure 134/74, pulse 82, temperature (!) 97 F (36.1 C), temperature source Tympanic, resp. rate 16, weight 142 lb 13.7 oz (64.8 kg), SpO2 99 %.  Physical Exam Vitals and nursing note reviewed.  Constitutional:      General: She is not in acute distress.    Appearance: She is well-developed. She is not ill-appearing, toxic-appearing or diaphoretic.     Interventions: Face mask in place.  HENT:     Head: Normocephalic and atraumatic.     Comments: Short blonde wig.    Mouth/Throat:      Mouth: Mucous membranes are dry.     Pharynx: Oropharynx is clear.  Eyes:     General: No scleral icterus.    Extraocular Movements: Extraocular movements intact.     Conjunctiva/sclera: Conjunctivae normal.     Pupils: Pupils are equal, round, and reactive to light.     Comments: Glasses. Brown eyes.   Cardiovascular:     Rate and Rhythm: Normal rate and regular rhythm.     Heart sounds: Murmur heard.    Pulmonary:     Effort: Pulmonary effort is normal. No respiratory distress.     Breath sounds: Normal breath sounds. No wheezing or rales.  Chest:     Chest wall: No tenderness.  Breasts:     Right: No supraclavicular adenopathy.     Left: No supraclavicular adenopathy.    Abdominal:     General: Bowel sounds are normal. There is no distension.     Palpations: Abdomen is soft. There is no hepatomegaly, splenomegaly or mass.     Tenderness: There is no abdominal tenderness. There is no guarding or rebound.  Musculoskeletal:        General: No swelling or tenderness. Normal range of motion.     Cervical back: Normal range of motion and neck supple.     Right lower leg: No edema.     Left lower leg: No edema.  Lymphadenopathy:     Head:     Right side of head: No preauricular, posterior auricular or occipital adenopathy.     Left side of head: No preauricular, posterior auricular or occipital adenopathy.  Cervical: No cervical adenopathy.     Upper Body:     Right upper body: No supraclavicular adenopathy.     Left upper body: No supraclavicular adenopathy.  Skin:    General: Skin is warm and dry.     Coloration: Skin is not jaundiced or pale.     Findings: No erythema.  Neurological:     Mental Status: She is alert and oriented to person, place, and time. Mental status is at baseline.  Psychiatric:        Behavior: Behavior normal.        Thought Content: Thought content normal.        Judgment: Judgment normal.    No visits with results within 3 Day(s) from  this visit.  Latest known visit with results is:  Infusion on 07/13/2020  Component Date Value Ref Range Status  . WBC 07/13/2020 6.0  4.0 - 10.5 K/uL Final  . RBC 07/13/2020 3.64* 3.87 - 5.11 MIL/uL Final  . Hemoglobin 07/13/2020 10.5* 12.0 - 15.0 g/dL Final  . HCT 07/13/2020 31.5* 36.0 - 46.0 % Final  . MCV 07/13/2020 86.5  80.0 - 100.0 fL Final  . MCH 07/13/2020 28.8  26.0 - 34.0 pg Final  . MCHC 07/13/2020 33.3  30.0 - 36.0 g/dL Final  . RDW 07/13/2020 15.3  11.5 - 15.5 % Final  . Platelets 07/13/2020 133* 150 - 400 K/uL Final  . nRBC 07/13/2020 0.0  0.0 - 0.2 % Final  . Neutrophils Relative % 07/13/2020 61  % Final  . Neutro Abs 07/13/2020 3.7  1.7 - 7.7 K/uL Final  . Lymphocytes Relative 07/13/2020 26  % Final  . Lymphs Abs 07/13/2020 1.5  0.7 - 4.0 K/uL Final  . Monocytes Relative 07/13/2020 10  % Final  . Monocytes Absolute 07/13/2020 0.6  0.1 - 1.0 K/uL Final  . Eosinophils Relative 07/13/2020 2  % Final  . Eosinophils Absolute 07/13/2020 0.1  0.0 - 0.5 K/uL Final  . Basophils Relative 07/13/2020 1  % Final  . Basophils Absolute 07/13/2020 0.0  0.0 - 0.1 K/uL Final  . Immature Granulocytes 07/13/2020 0  % Final  . Abs Immature Granulocytes 07/13/2020 0.01  0.00 - 0.07 K/uL Final   Performed at [facility], [address], [place], [place] [address]  . Sodium 07/13/2020 138  135 - 145 mmol/L Final  . Potassium 07/13/2020 3.9  3.5 - 5.1 mmol/L Final  . Chloride 07/13/2020 104  98 - 111 mmol/L Final  . CO2 07/13/2020 21* 22 - 32 mmol/L Final  . Glucose, Bld 07/13/2020 122* 70 - 99 mg/dL Final   Glucose reference range applies only to samples taken after fasting for at least 8 hours.  . BUN 07/13/2020 16  8 - 23 mg/dL Final  . Creatinine, Ser 07/13/2020 1.07* 0.44 - 1.00 mg/dL Final  . Calcium 07/13/2020 9.1  8.9 - 10.3 mg/dL Final  . Total Protein 07/13/2020 6.8  6.5 - 8.1 g/dL Final  . Albumin 07/13/2020 4.4  3.5 - 5.0 g/dL Final  . AST 07/13/2020 29  15 -  41 U/L Final  . ALT 07/13/2020 20  0 - 44 U/L Final  . Alkaline Phosphatase 07/13/2020 78  38 - 126 U/L Final  . Total Bilirubin 07/13/2020 0.2* 0.3 - 1.2 mg/dL Final  . GFR, Estimated 07/13/2020 58* >60 mL/min Final   Comment: (NOTE) Calculated using the CKD-EPI Creatinine Equation (2021)   . Anion gap 07/13/2020 13  5 -  15 Final   Performed at [facility], [address], [place], [place] [address]  . Magnesium 07/13/2020 1.2* 1.7 - 2.4 mg/dL Final   Performed at [facility], [address] [place], [place] [address]    Assessment:  Nema Oatley is a 65 y.o. female with stage III IgA lambda light chain multiple myeloma s/p autologous stem cell transplant on 06/14/2015 at the [facility] and second autologous stem cell transplant on 05/10/2020 at [facility].  Initial bone marrow revealed 80% plasma cells. Lambda free light chains were 1340. She had nephrotic range proteinuria. She initially underwent induction with RVD. Revlimid maintenance was discontinued on 01/21/2017 secondary to intolerance.   Bone marrowaspirate andbiopsyon 01/18/2021revealed anormocellularmarrow withbut increased lambda-restricted plasma cells (9% aspirate, 40% CD138 immunohistochemistry).Findingswereconsistent with recurrent plasma cell myeloma.Flow cytometry revealed no monoclonal B-cell or phenotypically aberrant T-cell population. Cytogeneticswere 52, XX (normal). 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  M-spikehas been followed: 0 on 04/02/2016 -06/23/2019; 0.2 on 09/02/2017, 0.1 on 11/25/2019, 0.1 on 01/10/2020, 0.2 on 02/07/2020, 0.1 on 03/07/2020, 0 on 04/10/2020, and 06/19/2020.  Lambda light chainshave been followed: 22.2 (ratio 0.56) on 07/03/2017, 30.8 (ratio 0.78) on 09/02/2017, 36.9 (ratio 0.40) on 10/21/2017, 37.4 (ratio 0.41) on 12/16/2017, 70.7(ratio 0.31) on 02/17/2018, 64.2 (ratio 0.27) on 04/07/2018, 78.9 (ratio 0.18) on  05/26/2018, 128.8 (ratio 0.17) on 08/06/2018, 181.5 (ratio 0.13) on 10/08/2018, 130.9 (ratio 0.13) on 10/20/2018, 160.7 (ratio 0.10)on 12/09/2018, 236.6 (ratio 0.07) on 02/01/2019, 363.6 (ratio 0.04) on 03/22/2019, 404.8 (ratio 0.04) on 04/05/2019, 420.7 (ratio 0.03) on 05/24/2019, 573.4 (ratio 0.03) on 06/23/2019, 451.05(ratio 0.02) on02/19/2021, 47.2 (ratio 0.13) on 10/25/2019, 22.4 (ratio 0.21) on 11/25/2019, 16.5 (ratio 0.33) on 01/10/2020, 14.6 (ratio 0.34) on 02/07/2020, 13.1 (ratio 0.31) on 03/07/2020, 10.1 (ratio 0.38) on 04/10/2020, and 9.5 (ratio 0.21) on 06/19/2020.  24 hour UPEPon 06/03/2019 revealed kappa free light chains95.76,lambda free light chains1,260.71, andratio 0.08.24 hourUPEPon 02/22/2021revealed totalproteinof72m/24 hrs withlambdafree light chains 1,084.174mL andratioof0.10(1.03-31.76). M spike inurinewas46.1%(36141m4 hrs).  Bone surveyon 04/08/2016 and11/28/2018 revealed no definite lytic lesion seen in the visualized skeleton.Bone surveyon 11/19/2018 revealed no suspicious lucent lesionsand no acute bony abnormality.PET scanon 07/12/2019 revealed no focal metabolic activity to suggest active myeloma within the skeleton. There wereno lytic lesions identified on the CT portion of the examorsoft tissue plasmacytomas. There was no evidence of multiple myeloma.  Pretreatment RBC phenotypeon 09/23/2019 waspositivefor C, e, DUFFY B, KIDD B, M, S, and s antigen; negativefor c, E, KELL, DUFFY A, KIDD A, and N antigen.  Shereceived 6 cycles of daratumumab and hyaluronidase-fihj, Pomalyst, and Decadron (DPd)(09/27/2019 - 10/25/2019; 12/09/2019 - 03/13/2020). Cycle #1 was complicated by fever and neutropenia requiring admission.  Cycle #2 was complicated by pneumonia requiring admission.  Cycle #6 was complicated with an ER evaluation for an elevated lactic acid.  She is day 70 55p [facility].  She underwent conditioning with melphalan 140 mg/m2.     She has severe aortic stenosis.  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 Cardiology is following for a possible TAVR in the future.  Echo on 07/05/2020 revealed moderate to severe aortic stenosis with an EF of 50-55%.  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 Zometa was discontinued in 01/2017. She has chronic nauseaon Phenergan.  She has B12 deficiency. B12 was 254 on 04/09/2017, 295 on 08/20/2018, and 391 on 10/08/2018.  Shewasonoral B12.She received B12 monthly (last12/13/2021).Folate was12.1on 01/10/2020.  She has iron deficiency. Ferritin was 32 on 07/01/2019. She received Venoferon 07/15/2019 and 07/22/2019.  She has hypogammaglobulinemia.  IgG was 245 on 11/25/2019.  She received monthly IVIG (07/22/021 - 03/22/2020).  She received IVIG 400 mg/kg on 01/20/2020, 200 mg/kg on 02/17/2020, and 300 mg/kg on 03/22/2020.  IVIG on 39/76/7341 was complicated by acute renal failure. IgG trough level was 418 on 02/17/2020.  She was admitted to [facility] 10/15/2019 - 10/20/2019 with fever and neutropenia. Cultures were negative. CXR was negative. She received broad spectrum antibiotics and daily Granix. She received IVF for acute renal insufficiency due to diarrhea and dehydration. Creatine was 1.66 on admission and 1.12 on discharge.  She was admitted to [facility] from 03/01/2020 - 03/05/2020 with fever and neutropenia. CXR revealed no active cardiopulmonary disease. Chest CT with contrast revealed no acute intrathoracic pathology. There were findings which could be suggestive of prior granulomatous disease. She was treated with Cefepime and Vancomycin, then switched to ciprofloxacin on 03/03/2020.   The patient was admitted to [facility] from 05/08/2020 - 06/04/2020 for autologous bone marrow transplant.   She received  conditioning with melphalan 140 mg/m2.  Bone marrow transplant was on 05/10/2020. The patient developed fevers daily from 05/19/2020 - 05/24/2020. Blood cultures were + for strept sanguis bacteremia.  She was treated cefepime, vancomycin, and solumedrol for possible engraftment syndrome. Cefepime was switched to ceftriaxone; she completed 2 weeks of ceftriaxone on 06/03/2020. She had chemotherapy induced diarrhea.  She experienced urinary retention.  She received the [place] 09/20/2019 and 10/09/2019.  Symptomatically, she feels stronger.  She has a persistent neuropathy in her fingertips and toes.  She has chonic nausea well controlled with Phenergan. Magnesium is 1.1.  Plan: 1.   Labs today: BMP, Mg. 2. Stage III multiple myeloma  Symptomatically, she continues to improve s/p transplant.   She has a lingering neuropathy.   Exam is stable.  Lambda free light chains have improved from451.05on02/19/2021. After cycle #1: 47.2 (ratio 0.13). After cycle #6:10.1 (ratio 0.38).   After transplant: 9.5 (ratio 0.21).  She is day 68 s/p stem cell transplant #2 (05/10/2020).  Infection prophylaxis   Bactrim DS MWF through at least day 60 s/p transplant.    Per protocol, anticipate discontinuation.   Valtrex through 1 year s/p transplant.  Vaccination   Anticipate initiation at [facility] after day 60.   She should receive 2 month vaccines today.  Post transplant RN coordinator [phone]).  Counts have recovered.  She is transfusion independent.  Discuss plans for maintenance Velcade every 2 weeks approximately 60-100 days s/p transplant.   Decision based on functional and hematologic recovery.   She is followed by Dr Shari Prows in the myeloma program at [facility] [phone]; [phone])- message sent.   Preauth Velcade.  Discuss symptom management.  She has antiemetics (Phenergan) at home to use on a  prn bases.  Interventions are adequate.    3.   Normocytic anemia  Hematocrit 31.5.  Hemoglobin 10.5.  MCV 86.5 on 07/13/2020.  Continue to monitor. 4. Grade II peripheral neuropathy  Neuropathy is slightly worse s/p 2nd transplant.  Discuss potential issues of worsening neuropathy with initiation of Velcade maintenance. 5.   Hypogammaglobulinemia             She has had recurrent infections.               She received 4 doses of IVIG (last 04/24/2020). Goal IgGtrough level is 500.  Follow-up with transplant team re: continuation of monthly IVIG. 6.   Chronic hypomagnesemia Magnesium1.1 today. Patient unable to receive magnesium in clinic.  Magnesium 6 gm IV via infusion pump.  Continue magnesium checks twice weekly (Mondays and Thursdays). 7.   Chronic nausea  Continue Phenergan oral and IV prn when she is in clinic. 8.   Aortic stenosis  Echo on 05/21/2020 revealed severe aortic stenosis (tricuspid valve but 2 leaflets fused) and EF 45-50%.  Echo on 07/05/2020 revealed moderate to severe aortic stenosis with an EF of 50-55%.  Notes from [facility] cardiology on 07/05/2020 reviewed.    She has a follow-up echo in 6 months. 9.   B12 deficiency  Patient receives B12 monthly (last 06/12/2020).  B12 today and monthly x 6. 10.   RTC on Mondays and Thursdays (next 01/20) for labs (Mg) and +/- IV Mg. 11.   RTC in 1 month for MD assessment, labs (CBC with diff, CMP, SPEP, FLCA), Mg), and +/- Velcade.  I discussed the assessment and treatment plan with the patient.  The patient was provided an opportunity to ask questions and all were answered.  The patient agreed with the plan and demonstrated an understanding of the instructions.  The patient was advised to call back if the symptoms worsen or if the condition fails to improve as anticipated.    Lequita Asal, MD, PhD    07/18/2020, 10:32 AM   I, Mirian Mo Tufford, am acting as Education administrator for [employer].  Mike Gip, MD, PhD.  I, Melissa C. Mike Gip, MD, have reviewed the above documentation for accuracy and completeness, and I agree with the above.

## 2020-07-18 ENCOUNTER — Inpatient Hospital Stay: Payer: Medicare HMO

## 2020-07-18 ENCOUNTER — Other Ambulatory Visit: Payer: Self-pay

## 2020-07-18 ENCOUNTER — Inpatient Hospital Stay (HOSPITAL_BASED_OUTPATIENT_CLINIC_OR_DEPARTMENT_OTHER): Payer: Medicare HMO | Admitting: Hematology and Oncology

## 2020-07-18 ENCOUNTER — Encounter: Payer: Self-pay | Admitting: Hematology and Oncology

## 2020-07-18 ENCOUNTER — Other Ambulatory Visit: Payer: Self-pay | Admitting: Hematology and Oncology

## 2020-07-18 VITALS — BP 134/74 | HR 82 | Temp 97.0°F | Resp 16 | Wt 142.9 lb

## 2020-07-18 DIAGNOSIS — R110 Nausea: Secondary | ICD-10-CM | POA: Diagnosis not present

## 2020-07-18 DIAGNOSIS — C9002 Multiple myeloma in relapse: Secondary | ICD-10-CM | POA: Diagnosis not present

## 2020-07-18 DIAGNOSIS — Z7189 Other specified counseling: Secondary | ICD-10-CM

## 2020-07-18 DIAGNOSIS — I129 Hypertensive chronic kidney disease with stage 1 through stage 4 chronic kidney disease, or unspecified chronic kidney disease: Secondary | ICD-10-CM | POA: Diagnosis not present

## 2020-07-18 DIAGNOSIS — T451X5A Adverse effect of antineoplastic and immunosuppressive drugs, initial encounter: Secondary | ICD-10-CM | POA: Diagnosis not present

## 2020-07-18 DIAGNOSIS — Z9484 Stem cells transplant status: Secondary | ICD-10-CM | POA: Diagnosis not present

## 2020-07-18 DIAGNOSIS — C9000 Multiple myeloma not having achieved remission: Secondary | ICD-10-CM

## 2020-07-18 DIAGNOSIS — G620 Drug-induced polyneuropathy: Secondary | ICD-10-CM

## 2020-07-18 DIAGNOSIS — I350 Nonrheumatic aortic (valve) stenosis: Secondary | ICD-10-CM | POA: Diagnosis not present

## 2020-07-18 DIAGNOSIS — M79676 Pain in unspecified toe(s): Secondary | ICD-10-CM | POA: Diagnosis not present

## 2020-07-18 DIAGNOSIS — E1122 Type 2 diabetes mellitus with diabetic chronic kidney disease: Secondary | ICD-10-CM | POA: Diagnosis not present

## 2020-07-18 DIAGNOSIS — N183 Chronic kidney disease, stage 3 unspecified: Secondary | ICD-10-CM | POA: Diagnosis not present

## 2020-07-18 DIAGNOSIS — B955 Unspecified streptococcus as the cause of diseases classified elsewhere: Secondary | ICD-10-CM | POA: Diagnosis not present

## 2020-07-18 DIAGNOSIS — D649 Anemia, unspecified: Secondary | ICD-10-CM | POA: Diagnosis not present

## 2020-07-18 DIAGNOSIS — Z9481 Bone marrow transplant status: Secondary | ICD-10-CM | POA: Diagnosis not present

## 2020-07-18 DIAGNOSIS — R7881 Bacteremia: Secondary | ICD-10-CM | POA: Diagnosis not present

## 2020-07-18 DIAGNOSIS — R197 Diarrhea, unspecified: Secondary | ICD-10-CM | POA: Diagnosis not present

## 2020-07-18 DIAGNOSIS — D801 Nonfamilial hypogammaglobulinemia: Secondary | ICD-10-CM | POA: Diagnosis not present

## 2020-07-18 DIAGNOSIS — E538 Deficiency of other specified B group vitamins: Secondary | ICD-10-CM

## 2020-07-18 DIAGNOSIS — D849 Immunodeficiency, unspecified: Secondary | ICD-10-CM | POA: Diagnosis not present

## 2020-07-18 LAB — BASIC METABOLIC PANEL
Anion gap: 11 (ref 5–15)
BUN: 22 mg/dL (ref 8–23)
CO2: 21 mmol/L — ABNORMAL LOW (ref 22–32)
Calcium: 9 mg/dL (ref 8.9–10.3)
Chloride: 104 mmol/L (ref 98–111)
Creatinine, Ser: 1.06 mg/dL — ABNORMAL HIGH (ref 0.44–1.00)
GFR, Estimated: 59 mL/min — ABNORMAL LOW (ref >60)
Glucose, Bld: 137 mg/dL — ABNORMAL HIGH (ref 70–99)
Potassium: 3.9 mmol/L (ref 3.5–5.1)
Sodium: 136 mmol/L (ref 135–145)

## 2020-07-18 LAB — MAGNESIUM: Magnesium: 1.1 mg/dL — ABNORMAL LOW (ref 1.7–2.4)

## 2020-07-18 MED ORDER — CYANOCOBALAMIN 1000 MCG/ML IJ SOLN
1000.0000 ug | Freq: Once | INTRAMUSCULAR | Status: AC
  Administered 2020-07-18: 1000 ug via INTRAMUSCULAR

## 2020-07-18 MED ORDER — SODIUM CHLORIDE 0.9 % IV SOLN
6.0000 g | Freq: Once | INTRAVENOUS | Status: DC
  Administered 2020-07-18: 6 g via INTRAVENOUS
  Filled 2020-07-18: qty 12

## 2020-07-18 MED ORDER — CYANOCOBALAMIN 1000 MCG/ML IJ SOLN
INTRAMUSCULAR | Status: AC
  Filled 2020-07-18: qty 1

## 2020-07-18 MED ORDER — MAGNESIUM SULFATE 4 GM/100ML IV SOLN: 4.0000 g | Freq: Once | INTRAVENOUS | Status: DC

## 2020-07-18 MED ORDER — MAGNESIUM SULFATE 2 GM/50ML IV SOLN: 2.0000 g | Freq: Once | INTRAVENOUS | Status: DC

## 2020-07-18 MED ORDER — SODIUM CHLORIDE 0.9 % IV SOLN: 6.0000 g | Freq: Once | INTRAVENOUS | Status: DC

## 2020-07-18 NOTE — Progress Notes (Unsigned)
Patient here for oncology follow-up appointment, expresses concerns of dizzy spells, numbness and back pain

## 2020-07-18 NOTE — Progress Notes (Signed)
Patient received prescribed treatment in clinic. B12 injection and sent home with 24 hour magnesium pump.Tolerated well. Patient stable at discharge.

## 2020-07-20 ENCOUNTER — Inpatient Hospital Stay: Payer: Medicare HMO

## 2020-07-20 ENCOUNTER — Other Ambulatory Visit: Payer: Self-pay

## 2020-07-20 DIAGNOSIS — C9000 Multiple myeloma not having achieved remission: Secondary | ICD-10-CM | POA: Diagnosis not present

## 2020-07-20 DIAGNOSIS — D801 Nonfamilial hypogammaglobulinemia: Secondary | ICD-10-CM | POA: Diagnosis not present

## 2020-07-20 DIAGNOSIS — Z95828 Presence of other vascular implants and grafts: Secondary | ICD-10-CM

## 2020-07-20 DIAGNOSIS — D649 Anemia, unspecified: Secondary | ICD-10-CM | POA: Diagnosis not present

## 2020-07-20 DIAGNOSIS — N183 Chronic kidney disease, stage 3 unspecified: Secondary | ICD-10-CM | POA: Diagnosis not present

## 2020-07-20 DIAGNOSIS — R197 Diarrhea, unspecified: Secondary | ICD-10-CM | POA: Diagnosis not present

## 2020-07-20 DIAGNOSIS — E1122 Type 2 diabetes mellitus with diabetic chronic kidney disease: Secondary | ICD-10-CM | POA: Diagnosis not present

## 2020-07-20 DIAGNOSIS — E538 Deficiency of other specified B group vitamins: Secondary | ICD-10-CM | POA: Diagnosis not present

## 2020-07-20 DIAGNOSIS — I129 Hypertensive chronic kidney disease with stage 1 through stage 4 chronic kidney disease, or unspecified chronic kidney disease: Secondary | ICD-10-CM | POA: Diagnosis not present

## 2020-07-20 DIAGNOSIS — R110 Nausea: Secondary | ICD-10-CM | POA: Diagnosis not present

## 2020-07-20 LAB — MAGNESIUM: Magnesium: 1.8 mg/dL (ref 1.7–2.4)

## 2020-07-20 MED ORDER — SODIUM CHLORIDE 0.9% FLUSH
10.0000 mL | INTRAVENOUS | Status: DC | PRN
  Administered 2020-07-20: 10 mL via INTRAVENOUS
  Filled 2020-07-20: qty 10

## 2020-07-20 MED ORDER — HEPARIN SOD (PORK) LOCK FLUSH 100 UNIT/ML IV SOLN
500.0000 [IU] | Freq: Once | INTRAVENOUS | Status: AC
  Administered 2020-07-20: 500 [IU] via INTRAVENOUS
  Filled 2020-07-20: qty 5

## 2020-07-24 ENCOUNTER — Other Ambulatory Visit: Payer: Self-pay

## 2020-07-24 ENCOUNTER — Inpatient Hospital Stay: Payer: Medicare HMO

## 2020-07-24 DIAGNOSIS — C9000 Multiple myeloma not having achieved remission: Secondary | ICD-10-CM | POA: Diagnosis not present

## 2020-07-24 DIAGNOSIS — E538 Deficiency of other specified B group vitamins: Secondary | ICD-10-CM | POA: Diagnosis not present

## 2020-07-24 DIAGNOSIS — D801 Nonfamilial hypogammaglobulinemia: Secondary | ICD-10-CM | POA: Diagnosis not present

## 2020-07-24 DIAGNOSIS — R110 Nausea: Secondary | ICD-10-CM | POA: Diagnosis not present

## 2020-07-24 DIAGNOSIS — E1122 Type 2 diabetes mellitus with diabetic chronic kidney disease: Secondary | ICD-10-CM | POA: Diagnosis not present

## 2020-07-24 DIAGNOSIS — N183 Chronic kidney disease, stage 3 unspecified: Secondary | ICD-10-CM | POA: Diagnosis not present

## 2020-07-24 DIAGNOSIS — D649 Anemia, unspecified: Secondary | ICD-10-CM | POA: Diagnosis not present

## 2020-07-24 DIAGNOSIS — R197 Diarrhea, unspecified: Secondary | ICD-10-CM | POA: Diagnosis not present

## 2020-07-24 DIAGNOSIS — I129 Hypertensive chronic kidney disease with stage 1 through stage 4 chronic kidney disease, or unspecified chronic kidney disease: Secondary | ICD-10-CM | POA: Diagnosis not present

## 2020-07-24 LAB — MAGNESIUM: Magnesium: 1.1 mg/dL — ABNORMAL LOW (ref 1.7–2.4)

## 2020-07-24 MED ORDER — HEPARIN SOD (PORK) LOCK FLUSH 100 UNIT/ML IV SOLN
500.0000 [IU] | Freq: Once | INTRAVENOUS | Status: AC
  Administered 2020-07-24: 500 [IU] via INTRAVENOUS
  Filled 2020-07-24: qty 5

## 2020-07-24 MED ORDER — MAGNESIUM SULFATE 4 GM/100ML IV SOLN
4.0000 g | Freq: Once | INTRAVENOUS | Status: AC
  Administered 2020-07-24: 4 g via INTRAVENOUS
  Filled 2020-07-24: qty 100

## 2020-07-24 MED ORDER — PROMETHAZINE HCL 25 MG/ML IJ SOLN
INTRAMUSCULAR | Status: AC
  Filled 2020-07-24: qty 1

## 2020-07-24 MED ORDER — PROMETHAZINE HCL 25 MG/ML IJ SOLN
25.0000 mg | Freq: Once | INTRAMUSCULAR | Status: AC
  Administered 2020-07-24: 25 mg via INTRAVENOUS

## 2020-07-24 MED ORDER — MAGNESIUM SULFATE 2 GM/50ML IV SOLN
2.0000 g | Freq: Once | INTRAVENOUS | Status: AC
  Administered 2020-07-24: 2 g via INTRAVENOUS
  Filled 2020-07-24: qty 50

## 2020-07-24 MED ORDER — SODIUM CHLORIDE 0.9 % IV SOLN: 6.0000 g | Freq: Once | INTRAVENOUS | Status: DC

## 2020-07-24 MED ORDER — SODIUM CHLORIDE 0.9 % IV SOLN
INTRAVENOUS | Status: DC
  Filled 2020-07-24: qty 250

## 2020-07-24 NOTE — Progress Notes (Signed)
Pt received prescribed treatment in clinic, pt stable at d/c. 

## 2020-07-27 ENCOUNTER — Telehealth: Payer: Self-pay

## 2020-07-27 ENCOUNTER — Inpatient Hospital Stay: Payer: Medicare HMO

## 2020-07-27 ENCOUNTER — Telehealth: Payer: Self-pay | Admitting: Hematology and Oncology

## 2020-07-27 NOTE — Telephone Encounter (Signed)
Pt called this morning to report a fever of 100.5. She was advised to not come to the center and we would contact her regarding next steps.

## 2020-07-28 ENCOUNTER — Other Ambulatory Visit: Payer: Medicare HMO

## 2020-07-28 ENCOUNTER — Telehealth: Payer: Self-pay

## 2020-07-28 DIAGNOSIS — Z20822 Contact with and (suspected) exposure to covid-19: Secondary | ICD-10-CM

## 2020-07-30 LAB — SARS-COV-2, NAA 2 DAY TAT

## 2020-07-30 LAB — NOVEL CORONAVIRUS, NAA: SARS-CoV-2, NAA: NOT DETECTED

## 2020-07-31 ENCOUNTER — Inpatient Hospital Stay: Payer: Medicare HMO

## 2020-07-31 ENCOUNTER — Other Ambulatory Visit: Payer: Self-pay

## 2020-07-31 DIAGNOSIS — D801 Nonfamilial hypogammaglobulinemia: Secondary | ICD-10-CM | POA: Diagnosis not present

## 2020-07-31 DIAGNOSIS — C9000 Multiple myeloma not having achieved remission: Secondary | ICD-10-CM | POA: Diagnosis not present

## 2020-07-31 DIAGNOSIS — N183 Chronic kidney disease, stage 3 unspecified: Secondary | ICD-10-CM | POA: Diagnosis not present

## 2020-07-31 DIAGNOSIS — R110 Nausea: Secondary | ICD-10-CM

## 2020-07-31 DIAGNOSIS — R197 Diarrhea, unspecified: Secondary | ICD-10-CM | POA: Diagnosis not present

## 2020-07-31 DIAGNOSIS — D649 Anemia, unspecified: Secondary | ICD-10-CM | POA: Diagnosis not present

## 2020-07-31 DIAGNOSIS — I129 Hypertensive chronic kidney disease with stage 1 through stage 4 chronic kidney disease, or unspecified chronic kidney disease: Secondary | ICD-10-CM | POA: Diagnosis not present

## 2020-07-31 DIAGNOSIS — R112 Nausea with vomiting, unspecified: Secondary | ICD-10-CM

## 2020-07-31 DIAGNOSIS — E538 Deficiency of other specified B group vitamins: Secondary | ICD-10-CM | POA: Diagnosis not present

## 2020-07-31 DIAGNOSIS — E1122 Type 2 diabetes mellitus with diabetic chronic kidney disease: Secondary | ICD-10-CM | POA: Diagnosis not present

## 2020-07-31 LAB — MAGNESIUM: Magnesium: 1.1 mg/dL — ABNORMAL LOW (ref 1.7–2.4)

## 2020-07-31 MED ORDER — MAGNESIUM SULFATE 4 GM/100ML IV SOLN
4.0000 g | Freq: Once | INTRAVENOUS | Status: AC
  Administered 2020-07-31: 4 g via INTRAVENOUS
  Filled 2020-07-31: qty 100

## 2020-07-31 MED ORDER — SODIUM CHLORIDE 0.9 % IV SOLN
Freq: Once | INTRAVENOUS | Status: AC
  Filled 2020-07-31: qty 250

## 2020-07-31 MED ORDER — SODIUM CHLORIDE 0.9 % IV SOLN: 6.0000 g | Freq: Once | INTRAVENOUS | Status: DC

## 2020-07-31 MED ORDER — SODIUM CHLORIDE 0.9% FLUSH
10.0000 mL | Freq: Once | INTRAVENOUS | Status: AC | PRN
  Administered 2020-07-31: 10 mL
  Filled 2020-07-31: qty 10

## 2020-07-31 MED ORDER — PROMETHAZINE HCL 25 MG PO TABS: ORAL_TABLET | ORAL | 0 refills | Status: DC

## 2020-07-31 MED ORDER — PROMETHAZINE HCL 25 MG/ML IJ SOLN
25.0000 mg | Freq: Once | INTRAMUSCULAR | Status: AC
  Administered 2020-07-31: 25 mg via INTRAVENOUS
  Filled 2020-07-31: qty 1

## 2020-07-31 MED ORDER — MAGNESIUM SULFATE 2 GM/50ML IV SOLN
2.0000 g | Freq: Once | INTRAVENOUS | Status: AC
  Administered 2020-07-31: 2 g via INTRAVENOUS
  Filled 2020-07-31: qty 50

## 2020-07-31 MED ORDER — HEPARIN SOD (PORK) LOCK FLUSH 100 UNIT/ML IV SOLN
500.0000 [IU] | Freq: Once | INTRAVENOUS | Status: AC | PRN
  Administered 2020-07-31: 500 [IU]
  Filled 2020-07-31: qty 5

## 2020-07-31 MED ORDER — PROMETHAZINE HCL 25 MG PO TABS: 25.0000 mg | ORAL_TABLET | Freq: Four times a day (QID) | ORAL | 0 refills | Status: DC | PRN

## 2020-07-31 NOTE — Progress Notes (Signed)
Patient received prescribed treatment in clinic. Tolerated well. Patient stable at discharge. 

## 2020-08-01 DIAGNOSIS — D631 Anemia in chronic kidney disease: Secondary | ICD-10-CM | POA: Diagnosis not present

## 2020-08-01 DIAGNOSIS — I10 Essential (primary) hypertension: Secondary | ICD-10-CM | POA: Diagnosis not present

## 2020-08-01 DIAGNOSIS — E1122 Type 2 diabetes mellitus with diabetic chronic kidney disease: Secondary | ICD-10-CM | POA: Diagnosis not present

## 2020-08-01 DIAGNOSIS — N1831 Chronic kidney disease, stage 3a: Secondary | ICD-10-CM | POA: Diagnosis not present

## 2020-08-03 ENCOUNTER — Other Ambulatory Visit: Payer: Self-pay

## 2020-08-03 ENCOUNTER — Inpatient Hospital Stay: Payer: Medicare HMO | Attending: Hematology and Oncology

## 2020-08-03 ENCOUNTER — Inpatient Hospital Stay: Payer: Medicare HMO

## 2020-08-03 VITALS — BP 117/71 | HR 88 | Temp 96.8°F | Resp 18

## 2020-08-03 DIAGNOSIS — C9000 Multiple myeloma not having achieved remission: Secondary | ICD-10-CM | POA: Diagnosis not present

## 2020-08-03 DIAGNOSIS — R110 Nausea: Secondary | ICD-10-CM

## 2020-08-03 LAB — MAGNESIUM: Magnesium: 1.3 mg/dL — ABNORMAL LOW (ref 1.7–2.4)

## 2020-08-03 MED ORDER — SODIUM CHLORIDE 0.9% FLUSH
10.0000 mL | Freq: Once | INTRAVENOUS | Status: AC | PRN
  Administered 2020-08-03: 10 mL
  Filled 2020-08-03: qty 10

## 2020-08-03 MED ORDER — SODIUM CHLORIDE 0.9 % IV SOLN
Freq: Once | INTRAVENOUS | Status: AC
  Filled 2020-08-03: qty 250

## 2020-08-03 MED ORDER — MAGNESIUM SULFATE 4 GM/100ML IV SOLN
4.0000 g | Freq: Once | INTRAVENOUS | Status: AC
  Administered 2020-08-03: 4 g via INTRAVENOUS
  Filled 2020-08-03: qty 100

## 2020-08-03 MED ORDER — PROMETHAZINE HCL 25 MG/ML IJ SOLN
25.0000 mg | Freq: Four times a day (QID) | INTRAMUSCULAR | Status: DC | PRN
  Administered 2020-08-03: 25 mg via INTRAVENOUS
  Filled 2020-08-03: qty 1

## 2020-08-03 MED ORDER — HEPARIN SOD (PORK) LOCK FLUSH 100 UNIT/ML IV SOLN
500.0000 [IU] | Freq: Once | INTRAVENOUS | Status: AC | PRN
  Administered 2020-08-03: 500 [IU]
  Filled 2020-08-03: qty 5

## 2020-08-03 NOTE — Progress Notes (Signed)
Patient received prescribed treatment in clinic. 4 grams IV magnesium and 25 mg of Phenergan IV. Tolerated well. Patient stable at discharge.

## 2020-08-07 ENCOUNTER — Inpatient Hospital Stay: Payer: Medicare HMO

## 2020-08-07 ENCOUNTER — Ambulatory Visit: Payer: Medicare PPO

## 2020-08-07 ENCOUNTER — Other Ambulatory Visit: Payer: Self-pay

## 2020-08-07 DIAGNOSIS — C9000 Multiple myeloma not having achieved remission: Secondary | ICD-10-CM | POA: Diagnosis not present

## 2020-08-07 DIAGNOSIS — R110 Nausea: Secondary | ICD-10-CM

## 2020-08-07 LAB — MAGNESIUM: Magnesium: 1.3 mg/dL — ABNORMAL LOW (ref 1.7–2.4)

## 2020-08-07 MED ORDER — SODIUM CHLORIDE 0.9 % IV SOLN
Freq: Once | INTRAVENOUS | Status: AC
Start: 2020-08-07 — End: 2020-08-07
  Filled 2020-08-07: qty 250

## 2020-08-07 MED ORDER — HEPARIN SOD (PORK) LOCK FLUSH 100 UNIT/ML IV SOLN
500.0000 [IU] | Freq: Once | INTRAVENOUS | Status: AC | PRN
  Administered 2020-08-07: 500 [IU]
  Filled 2020-08-07: qty 5

## 2020-08-07 MED ORDER — MAGNESIUM SULFATE 4 GM/100ML IV SOLN
4.0000 g | Freq: Once | INTRAVENOUS | Status: AC
  Administered 2020-08-07: 4 g via INTRAVENOUS
  Filled 2020-08-07: qty 100

## 2020-08-07 MED ORDER — SODIUM CHLORIDE 0.9% FLUSH
10.0000 mL | Freq: Once | INTRAVENOUS | Status: AC | PRN
  Administered 2020-08-07: 10 mL
  Filled 2020-08-07: qty 10

## 2020-08-07 MED ORDER — PROMETHAZINE HCL 25 MG/ML IJ SOLN
25.0000 mg | Freq: Once | INTRAMUSCULAR | Status: AC
  Administered 2020-08-07: 25 mg via INTRAVENOUS
  Filled 2020-08-07: qty 1

## 2020-08-07 NOTE — Progress Notes (Signed)
Patient received prescribed treatment in clinic. 4 grams IV magnesium over 2 hours and 25 mg phenergan IV x 1 dose. Tolerated well. Patient stable at discharge.

## 2020-08-09 ENCOUNTER — Other Ambulatory Visit: Payer: Self-pay | Admitting: Internal Medicine

## 2020-08-09 ENCOUNTER — Encounter: Payer: Self-pay | Admitting: Internal Medicine

## 2020-08-09 DIAGNOSIS — F5101 Primary insomnia: Secondary | ICD-10-CM

## 2020-08-09 MED ORDER — TRAZODONE HCL 100 MG PO TABS
100.0000 mg | ORAL_TABLET | Freq: Every day | ORAL | 1 refills | Status: DC
Start: 2020-08-09 — End: 2020-12-05

## 2020-08-09 NOTE — Telephone Encounter (Signed)
Pt response.  KP

## 2020-08-10 ENCOUNTER — Inpatient Hospital Stay: Payer: Medicare HMO

## 2020-08-10 ENCOUNTER — Other Ambulatory Visit: Payer: Self-pay

## 2020-08-10 VITALS — BP 130/80 | HR 86 | Temp 97.0°F | Resp 16

## 2020-08-10 DIAGNOSIS — R110 Nausea: Secondary | ICD-10-CM

## 2020-08-10 DIAGNOSIS — C9000 Multiple myeloma not having achieved remission: Secondary | ICD-10-CM | POA: Diagnosis not present

## 2020-08-10 LAB — MAGNESIUM: Magnesium: 1.2 mg/dL — ABNORMAL LOW (ref 1.7–2.4)

## 2020-08-10 MED ORDER — SODIUM CHLORIDE 0.9 % IV SOLN: 6.0000 g | Freq: Once | INTRAVENOUS | Status: DC

## 2020-08-10 MED ORDER — MAGNESIUM SULFATE 4 GM/100ML IV SOLN
4.0000 g | Freq: Once | INTRAVENOUS | Status: AC
  Administered 2020-08-10: 4 g via INTRAVENOUS
  Filled 2020-08-10: qty 100

## 2020-08-10 MED ORDER — MAGNESIUM SULFATE 2 GM/50ML IV SOLN
2.0000 g | Freq: Once | INTRAVENOUS | Status: AC
  Administered 2020-08-10: 2 g via INTRAVENOUS
  Filled 2020-08-10: qty 50

## 2020-08-10 MED ORDER — SODIUM CHLORIDE 0.9 % IV SOLN
Freq: Once | INTRAVENOUS | Status: AC
  Filled 2020-08-10: qty 250

## 2020-08-10 MED ORDER — PROMETHAZINE HCL 25 MG/ML IJ SOLN
25.0000 mg | Freq: Once | INTRAMUSCULAR | Status: AC
  Administered 2020-08-10: 25 mg via INTRAVENOUS
  Filled 2020-08-10: qty 1

## 2020-08-10 MED ORDER — HEPARIN SOD (PORK) LOCK FLUSH 100 UNIT/ML IV SOLN
500.0000 [IU] | Freq: Once | INTRAVENOUS | Status: AC | PRN
Start: 2020-08-10 — End: 2020-08-10
  Administered 2020-08-10: 500 [IU]
  Filled 2020-08-10: qty 5

## 2020-08-10 MED ORDER — SODIUM CHLORIDE 0.9% FLUSH
10.0000 mL | Freq: Once | INTRAVENOUS | Status: AC | PRN
  Administered 2020-08-10: 10 mL
  Filled 2020-08-10: qty 10

## 2020-08-10 NOTE — Progress Notes (Signed)
Patient received prescribed treatment in clinic. 6 grams IV magnesium over 4 hours and 25 mg IV phenergan x 1 dose. Tolerated well. Patient stable at discharge.

## 2020-08-14 ENCOUNTER — Inpatient Hospital Stay: Payer: Medicare HMO

## 2020-08-14 ENCOUNTER — Other Ambulatory Visit: Payer: Self-pay

## 2020-08-14 DIAGNOSIS — C9000 Multiple myeloma not having achieved remission: Secondary | ICD-10-CM | POA: Diagnosis not present

## 2020-08-14 DIAGNOSIS — Z95828 Presence of other vascular implants and grafts: Secondary | ICD-10-CM

## 2020-08-14 DIAGNOSIS — R110 Nausea: Secondary | ICD-10-CM

## 2020-08-14 DIAGNOSIS — E876 Hypokalemia: Secondary | ICD-10-CM

## 2020-08-14 LAB — MAGNESIUM: Magnesium: 1.4 mg/dL — ABNORMAL LOW (ref 1.7–2.4)

## 2020-08-14 LAB — POTASSIUM: Potassium: 4.2 mmol/L (ref 3.5–5.1)

## 2020-08-14 MED ORDER — MAGNESIUM SULFATE 4 GM/100ML IV SOLN
4.0000 g | Freq: Once | INTRAVENOUS | Status: AC
  Administered 2020-08-14: 4 g via INTRAVENOUS
  Filled 2020-08-14: qty 100

## 2020-08-14 MED ORDER — SODIUM CHLORIDE 0.9 % IV SOLN
Freq: Once | INTRAVENOUS | Status: AC
  Filled 2020-08-14: qty 250

## 2020-08-14 MED ORDER — SODIUM CHLORIDE 0.9% FLUSH
10.0000 mL | INTRAVENOUS | Status: DC | PRN
  Administered 2020-08-14: 10 mL via INTRAVENOUS
  Filled 2020-08-14: qty 10

## 2020-08-14 MED ORDER — HEPARIN SOD (PORK) LOCK FLUSH 100 UNIT/ML IV SOLN
500.0000 [IU] | Freq: Once | INTRAVENOUS | Status: AC
  Administered 2020-08-14: 500 [IU] via INTRAVENOUS
  Filled 2020-08-14: qty 5

## 2020-08-14 MED ORDER — PROMETHAZINE HCL 25 MG/ML IJ SOLN
25.0000 mg | Freq: Once | INTRAMUSCULAR | Status: AC
  Administered 2020-08-14: 25 mg via INTRAVENOUS
  Filled 2020-08-14: qty 1

## 2020-08-14 NOTE — Progress Notes (Signed)
Patient received prescribed treatment in clinic. Tolerated well. Patient stable at discharge. 

## 2020-08-16 ENCOUNTER — Other Ambulatory Visit: Payer: Self-pay | Admitting: Hematology and Oncology

## 2020-08-16 DIAGNOSIS — C9000 Multiple myeloma not having achieved remission: Secondary | ICD-10-CM

## 2020-08-16 NOTE — Progress Notes (Signed)
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 - Subcutaneous Daratumumab (Daratumumab/hyaluronidase SUBQ + Pomalidomide + Dexamethasone) Until Progression or Unacceptable Toxicity TREATMENT RESPONSE: Complete Response (CR)  START ON PATHWAY REGIMEN - Multiple Myeloma and Other Plasma Cell Dyscrasias     A cycle is every 2 weeks:     Bortezomib   **Always confirm dose/schedule in your pharmacy ordering system**  Patient Characteristics: Multiple Myeloma, Maintenance Therapy, High Risk Disease Classification: Multiple Myeloma R-ISS Staging: III Therapeutic Status: Maintenance Therapy Risk Status: High Risk Intent of Therapy: Non-Curative / Palliative Intent, Discussed with Patient

## 2020-08-17 ENCOUNTER — Inpatient Hospital Stay: Payer: Medicare HMO

## 2020-08-17 ENCOUNTER — Other Ambulatory Visit: Payer: Self-pay

## 2020-08-17 DIAGNOSIS — R110 Nausea: Secondary | ICD-10-CM

## 2020-08-17 DIAGNOSIS — C9000 Multiple myeloma not having achieved remission: Secondary | ICD-10-CM

## 2020-08-17 DIAGNOSIS — D649 Anemia, unspecified: Secondary | ICD-10-CM

## 2020-08-17 LAB — CBC WITH DIFFERENTIAL/PLATELET
Abs Immature Granulocytes: 0.02 10*3/uL (ref 0.00–0.07)
Basophils Absolute: 0 10*3/uL (ref 0.0–0.1)
Basophils Relative: 0 %
Eosinophils Absolute: 0.1 10*3/uL (ref 0.0–0.5)
Eosinophils Relative: 2 %
HCT: 29 % — ABNORMAL LOW (ref 36.0–46.0)
Hemoglobin: 9.5 g/dL — ABNORMAL LOW (ref 12.0–15.0)
Immature Granulocytes: 0 %
Lymphocytes Relative: 16 %
Lymphs Abs: 0.7 10*3/uL (ref 0.7–4.0)
MCH: 28.1 pg (ref 26.0–34.0)
MCHC: 32.8 g/dL (ref 30.0–36.0)
MCV: 85.8 fL (ref 80.0–100.0)
Monocytes Absolute: 0.4 10*3/uL (ref 0.1–1.0)
Monocytes Relative: 9 %
Neutro Abs: 3.3 10*3/uL (ref 1.7–7.7)
Neutrophils Relative %: 73 %
Platelets: 128 10*3/uL — ABNORMAL LOW (ref 150–400)
RBC: 3.38 MIL/uL — ABNORMAL LOW (ref 3.87–5.11)
RDW: 12.9 % (ref 11.5–15.5)
WBC: 4.5 10*3/uL (ref 4.0–10.5)
nRBC: 0 % (ref 0.0–0.2)

## 2020-08-17 LAB — COMPREHENSIVE METABOLIC PANEL
ALT: 25 U/L (ref 0–44)
AST: 30 U/L (ref 15–41)
Albumin: 4 g/dL (ref 3.5–5.0)
Alkaline Phosphatase: 82 U/L (ref 38–126)
Anion gap: 11 (ref 5–15)
BUN: 27 mg/dL — ABNORMAL HIGH (ref 8–23)
CO2: 23 mmol/L (ref 22–32)
Calcium: 8.8 mg/dL — ABNORMAL LOW (ref 8.9–10.3)
Chloride: 104 mmol/L (ref 98–111)
Creatinine, Ser: 1.02 mg/dL — ABNORMAL HIGH (ref 0.44–1.00)
GFR, Estimated: >60 mL/min (ref >60)
Glucose, Bld: 139 mg/dL — ABNORMAL HIGH (ref 70–99)
Potassium: 4.1 mmol/L (ref 3.5–5.1)
Sodium: 138 mmol/L (ref 135–145)
Total Bilirubin: 0.4 mg/dL (ref 0.3–1.2)
Total Protein: 6.4 g/dL — ABNORMAL LOW (ref 6.5–8.1)

## 2020-08-17 LAB — MAGNESIUM: Magnesium: 1.3 mg/dL — ABNORMAL LOW (ref 1.7–2.4)

## 2020-08-17 MED ORDER — MAGNESIUM SULFATE 4 GM/100ML IV SOLN
4.0000 g | Freq: Once | INTRAVENOUS | Status: AC
  Administered 2020-08-17: 4 g via INTRAVENOUS
  Filled 2020-08-17: qty 100

## 2020-08-17 MED ORDER — SODIUM CHLORIDE 0.9 % IV SOLN
Freq: Once | INTRAVENOUS | Status: AC
  Filled 2020-08-17: qty 250

## 2020-08-17 MED ORDER — CYANOCOBALAMIN 1000 MCG/ML IJ SOLN
1000.0000 ug | Freq: Once | INTRAMUSCULAR | Status: AC
  Administered 2020-08-17: 1000 ug via INTRAMUSCULAR
  Filled 2020-08-17: qty 1

## 2020-08-17 MED ORDER — PROMETHAZINE HCL 25 MG/ML IJ SOLN
25.0000 mg | Freq: Once | INTRAMUSCULAR | Status: AC
  Administered 2020-08-17: 25 mg via INTRAVENOUS
  Filled 2020-08-17: qty 1

## 2020-08-17 MED ORDER — HEPARIN SOD (PORK) LOCK FLUSH 100 UNIT/ML IV SOLN
500.0000 [IU] | Freq: Once | INTRAVENOUS | Status: AC | PRN
  Administered 2020-08-17: 500 [IU]
  Filled 2020-08-17: qty 5

## 2020-08-17 MED ORDER — SODIUM CHLORIDE 0.9% FLUSH
10.0000 mL | Freq: Once | INTRAVENOUS | Status: AC | PRN
  Administered 2020-08-17: 10 mL
  Filled 2020-08-17: qty 10

## 2020-08-17 NOTE — Progress Notes (Signed)
[facility]  [address] [place], [place] [address] Phone: [phone]  Fax: [phone]   Clinic Day:  08/21/2020   Referring physician: Glean Hess, MD  Chief Complaint: Sarah [name] is a 65 y.o. 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  HPI:  The patient was last seen in the medical oncology clinic on 07/18/2020.  At that time, she felt stronger. She had a persistent neuropathy in her fingertips and toes. She had chonic nausea well controlled with Phenergan. Creatinine was 1.06 (CrCl 59 ml/min). Magnesium was 1.1. She was sent home with a 24-hour magnesium pump, 6 grams. She received a vitamin B12 injection.  The patient saw Dr. Virgina Norfolk on 07/18/2020. She reported loose stools and nausea. Her energy level was good and her appetite had returned. She appeared to be in an excellent remission. The patient discussed initiation of post-transplant vaccinations with the pharmacist. Given her persistent neuropathy, daratumumab was being considered rather than bortezomib.  Follow up was planned for 4 months; at that time she will be due for her 6 month vaccines. She was to stop Bactrim.  The patient saw Dr. Holley Raring on 08/01/2020. Her chronic kidney disease had been relatively stable. She was to continue lisinopril and Metformin. Follow-up was planned for 4 months.  The patient went to [facility] on 08/20/2020 after a fall. She lost her balance and fell backwards, hitting her head on the corner of a wall. She did not lose consciousness. She had a mild headache. A laceration on her scalp was repaired with 7 staples. She went to the [facility] ER for a head CT which revealed no acute intracranial abnormality.  Magnesium was 1.8 on 07/20/2020.  Magnesium has ranged between 1.1 - 1.4. She received 6 gm IV magnesium (07/24/2020, 07/31/2020,  08/10/2020) and 4 gm IV magnesium (08/03/2020, 08/07/2020, 08/14/2020).  She received monthly B12 on 08/17/2020.  During the interim, she has been "ok". She had a fall yesterday because her legs became "noodly," which has not happened in a couple of years. She is a little bit sore today and has a mild headache. She denies nausea and vomiting today.   The patient took Phenergan yesterday morning for nausea. She has had mild diarrhea over the past two days which has resolved. Her neuropathy is stable.  She sees Dr. Fatima Sanger on 09/08/2020.   Past Medical History:  Diagnosis Date  . Abnormal stress test 02/14/2016   Overview:  Added automatically from request for surgery [id number]  . Anemia   . Anxiety   . Arthritis   . Bicuspid aortic valve   . CHF (congestive heart failure) ([place])   . CKD (chronic kidney disease) stage 3, GFR 30-59 ml/min (HCC)   . Depression   . Diabetes mellitus ([place])   . Dizziness   . Fatty liver   . Frequent falls   . GERD (gastroesophageal reflux disease)   . Gout   . Heart murmur   . History of blood transfusion   . History of bone marrow transplant ([place])   . History of uterine fibroid   . Hx of cardiac catheterization 06/05/2016   Overview:  Normal coronaries 2017  . Hypertension   . Hypomagnesemia   . Multiple myeloma ([place])   . Personal history of chemotherapy   . Renal cyst     Past Surgical History:  Procedure Laterality Date  .  ABDOMINAL HYSTERECTOMY    . 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 CARDIAC ELECTROPHYSIOLOGY MAPPING AND ABLATION    . CARPAL TUNNEL RELEASE Bilateral   . CHOLECYSTECTOMY  2008  . COLONOSCOPY WITH PROPOFOL N/A 05/07/2017   Procedure: COLONOSCOPY WITH PROPOFOL;  Surgeon: [name], MD;  Location: [facility] ENDOSCOPY;  Service: Gastroenterology;  Laterality: N/A;  . ESOPHAGOGASTRODUODENOSCOPY (EGD) WITH PROPOFOL N/A 05/07/2017   Procedure: ESOPHAGOGASTRODUODENOSCOPY (EGD) WITH PROPOFOL;  Surgeon: [name], MD;  Location: [facility]  ENDOSCOPY;  Service: Gastroenterology;  Laterality: N/A;  . FOOT SURGERY Bilateral   . INCONTINENCE SURGERY  2009  . INTERSTIM IMPLANT PLACEMENT    . other     over active bladder  . OTHER SURGICAL HISTORY     bladder stimulator   . PARTIAL HYSTERECTOMY  03/1996   fibroids  . PORTA CATH INSERTION N/A 03/10/2019   Procedure: PORTA CATH INSERTION;  Surgeon: Algernon Huxley, MD;  Location: [facility];  Service: Cardiovascular;  Laterality: N/A;  . TONSILLECTOMY  2007    Family History  Problem Relation Age of Onset  . Colon cancer Father   . Renal Disease Father   . Diabetes Mellitus II Father   . Melanoma Paternal Grandmother   . Breast cancer Maternal Aunt 67  . Anemia Mother   . Heart disease Mother   . Heart failure Mother   . Renal Disease Mother   . Congestive Heart Failure Mother   . Heart disease Maternal Uncle   . Throat cancer Maternal Uncle   . Lung cancer Maternal Uncle   . Liver disease Maternal Uncle   . Heart failure Maternal Uncle   . Hearing loss Son 64       Suicide     Social History:  reports that she quit smoking about 29 years ago. Her smoking use included cigarettes. She has a 20.00 pack-year smoking history. She has never used smokeless tobacco. She reports current alcohol use. She reports that she does not use drugs. Patient has not had ETOH in several months. She is on disability. She notes exposure to perchloroethylene [facility]).She lives much of her adult life in [place]. She was married with 2 sons. Her husband passed away. Her 2 sons took their own lives (age 70 and 62). She worked in [employer] in [employer]. She went back to school and earned an [employer] and works for [employer] for several years.[place] sistersin [place]. She has two dogs, Harley and [place]. The patient is alone today.   Allergies:  Allergies  Allergen Reactions  . Oxycodone-Acetaminophen Anaphylaxis    Swelling and rash  . Celebrex  [Celecoxib] Diarrhea  . Codeine   . Plerixafor     In 2016 during ASCT collection patient developed fever to 103.7F and required hospitalization  . Benadryl [Diphenhydramine] Palpitations  . Morphine Itching and Rash  . Ondansetron Diarrhea  . Tylenol [Acetaminophen] Itching and Rash    Current Medications: Current Outpatient Medications  Medication Sig Dispense Refill  . allopurinol (ZYLOPRIM) 100 MG tablet TAKE 1 TABLET(100 MG) BY MOUTH DAILY as needed 90 tablet 1  . bisoprolol (ZEBETA) 5 MG tablet Take 5 mg by mouth daily.    . fexofenadine (ALLEGRA) 180 MG tablet TAKE 1 TABLET(180 MG) BY MOUTH DAILY 30 tablet 5  . FLUoxetine (PROZAC) 40 MG capsule TAKE 1 CAPSULE EVERY DAY 90 capsule 1  . glucose blood test strip     . metFORMIN (GLUCOPHAGE-XR) 500 MG 24  hr tablet TAKE 1 TABLET EVERY DAY WITH BREAKFAST 90 tablet 1  . methocarbamol (ROBAXIN) 500 MG tablet TAKE 1 TABLET FOUR TIMES DAILY. 120 tablet 0  . Multiple Vitamin (MULTIVITAMIN) tablet Take 1 tablet by mouth daily.    . Multiple Vitamins-Minerals (HAIR/SKIN/NAILS) TABS Take 1 tablet by mouth daily.    Marland Kitchen omeprazole (PRILOSEC) 40 MG capsule TAKE 1 CAPSULE EVERY DAY 90 capsule 3  . promethazine (PHENERGAN) 25 MG tablet Take 1 tablet (25 mg total) by mouth every 6 (six) hours as needed for nausea or vomiting. 90 tablet 0  . traZODone (DESYREL) 100 MG tablet Take 1 tablet (100 mg total) by mouth at bedtime. (Patient taking differently: Take 200 mg by mouth at bedtime.) 90 tablet 1  . acetaminophen (TYLENOL) 500 MG tablet Take 500 mg by mouth every 6 (six) hours as needed.  (Patient not taking: Reported on 08/21/2020)    . diclofenac sodium (VOLTAREN) 1 % GEL Apply 2 g topically 4 (four) times daily. (Patient not taking: Reported on 08/21/2020) 100 g 1  . diphenhydrAMINE (BENADRYL) 50 MG capsule Take 50 mg by mouth as needed.  (Patient not taking: No sig reported)    . diphenoxylate-atropine (LOMOTIL) 2.5-0.025 MG tablet Take 2 tablets by  mouth daily as needed for diarrhea or loose stools. (Patient not taking: Reported on 08/21/2020) 45 tablet 2  . fluticasone (FLONASE) 50 MCG/ACT nasal spray Place 2 sprays into both nostrils daily. (Patient not taking: Reported on 08/21/2020) 16 g 2  . lactulose (CHRONULAC) 10 GM/15ML solution Take 20 g by mouth daily as needed.  (Patient not taking: Reported on 08/21/2020)     No current facility-administered medications for this visit.   Facility-Administered Medications Ordered in Other Visits  Medication Dose Route Frequency Provider Last Rate Last Admin  . 0.9 %  sodium chloride infusion   Intravenous Continuous Lequita Asal, MD   Stopped at 01/20/20 1232  . heparin lock flush 100 unit/mL  500 Units Intravenous Once Faythe Casa E, NP      . sodium chloride flush (NS) 0.9 % injection 10 mL  10 mL Intravenous PRN Lequita Asal, MD   10 mL at 02/07/20 0815  . sodium chloride flush (NS) 0.9 % injection 10 mL  10 mL Intracatheter PRN Cammie Sickle, MD   10 mL at 02/10/20 0815  . sodium chloride flush (NS) 0.9 % injection 10 mL  10 mL Intravenous PRN Jacquelin Hawking, NP   10 mL at 03/01/20 1143    Review of Systems  Constitutional: Negative for chills, diaphoresis, fever, malaise/fatigue and weight loss (up 9 lbs).  HENT: Negative.  Negative for congestion, ear discharge, ear pain, hearing loss, nosebleeds, sinus pain, sore throat and tinnitus.   Eyes: Negative.  Negative for blurred vision.  Respiratory: Negative for cough, hemoptysis, sputum production, shortness of breath and wheezing.   Cardiovascular: Negative.  Negative for chest pain, palpitations and leg swelling.  Gastrointestinal: Positive for nausea (chronic, stable). Negative for abdominal pain, blood in stool, constipation, diarrhea (x 2 days, resolved), heartburn (on Prilosec), melena and vomiting.  Genitourinary: Negative.  Negative for dysuria, frequency and urgency.  Musculoskeletal: Positive for falls  (yesterday). Negative for back pain (chronic, 6/10), joint pain, myalgias and neck pain.  Skin: Negative.  Negative for itching and rash.  Neurological: Positive for sensory change (chronic numbness in toes and from fingertips to halfway up forearm, stable) and headaches (s/p fall). Negative for dizziness, tingling and weakness.  Endo/Heme/Allergies: Negative for environmental allergies. Does not bruise/bleed easily.       Diabetes, well controlled.  Psychiatric/Behavioral: Negative.  Negative for depression and memory loss. The patient is not nervous/anxious and does not have insomnia.   All other systems reviewed and are negative.   Performance status (ECOG): 1  Vitals Blood pressure 133/69, pulse 80, temperature (!) 96.6 F (35.9 C), temperature source Tympanic, resp. rate 18, height 5' 2.5" (1.588 m), weight 151 lb 0.2 oz (68.5 kg), SpO2 99 %.  Physical Exam Vitals and nursing note reviewed.  Constitutional:      General: She is not in acute distress.    Appearance: She is well-developed. She is not ill-appearing, toxic-appearing or diaphoretic.     Interventions: Face mask in place.  HENT:     Head: Normocephalic and atraumatic.     Mouth/Throat:     Mouth: Mucous membranes are moist.     Pharynx: Oropharynx is clear.  Eyes:     General: No scleral icterus.    Extraocular Movements: Extraocular movements intact.     Conjunctiva/sclera: Conjunctivae normal.     Pupils: Pupils are equal, round, and reactive to light.     Comments: Glasses. Brown eyes.   Cardiovascular:     Rate and Rhythm: Normal rate and regular rhythm.     Heart sounds: Normal heart sounds. No murmur heard.   Pulmonary:     Effort: Pulmonary effort is normal. No respiratory distress.     Breath sounds: Normal breath sounds. No wheezing or rales.  Chest:     Chest wall: No tenderness.  Breasts:     Right: No supraclavicular adenopathy.     Left: No supraclavicular adenopathy.    Abdominal:      General: Bowel sounds are normal. There is no distension.     Palpations: Abdomen is soft. There is no hepatomegaly, splenomegaly or mass.     Tenderness: There is no abdominal tenderness. There is no guarding or rebound.  Musculoskeletal:        General: No swelling or tenderness. Normal range of motion.     Cervical back: Normal range of motion and neck supple.     Right lower leg: No edema.     Left lower leg: No edema.  Lymphadenopathy:     Head:     Right side of head: No preauricular, posterior auricular or occipital adenopathy.     Left side of head: No preauricular, posterior auricular or occipital adenopathy.     Cervical: No cervical adenopathy.     Upper Body:     Right upper body: No supraclavicular adenopathy.     Left upper body: No supraclavicular adenopathy.  Skin:    General: Skin is warm and dry.     Coloration: Skin is not jaundiced or pale.     Findings: No erythema.     Comments: 3 cm laceration on scalp with staples.  Neurological:     Mental Status: She is alert and oriented to person, place, and time. Mental status is at baseline.  Psychiatric:        Behavior: Behavior normal.        Thought Content: Thought content normal.        Judgment: Judgment normal.    Infusion on 08/21/2020  Component Date Value Ref Range Status  . Magnesium 08/21/2020 1.3* 1.7 - 2.4 mg/dL Final   Performed at [facility] [address], [place], [place] [address]  Assessment:  Sarah [name] is a 65 y.o. 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  Initial bone marrow revealed 80% plasma cells. Lambda free light chains were 1340. She had nephrotic range proteinuria. She initially underwent induction with RVD. Revlimid maintenance was discontinued on 01/21/2017 secondary to intolerance.   Bone  marrowaspirate andbiopsyon 01/18/2021revealed anormocellularmarrow withbut increased lambda-restricted plasma cells (9% aspirate, 40% CD138 immunohistochemistry).Findingswereconsistent with recurrent plasma cell myeloma.Flow cytometry revealed no monoclonal B-cell or phenotypically aberrant T-cell population. Cytogeneticswere 66, XX (normal). 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  M-spikehas been followed: 0 on 04/02/2016 -06/23/2019; 0.2 on 09/02/2017, 0.1 on 11/25/2019, 0.1 on 01/10/2020, 0.2 on 02/07/2020, 0.1 on 03/07/2020, 0 on 04/10/2020, and 06/19/2020.  Lambda light chainshave been followed: 22.2 (ratio 0.56) on 07/03/2017, 30.8 (ratio 0.78) on 09/02/2017, 36.9 (ratio 0.40) on 10/21/2017, 37.4 (ratio 0.41) on 12/16/2017, 70.7(ratio 0.31) on 02/17/2018, 64.2 (ratio 0.27) on 04/07/2018, 78.9 (ratio 0.18) on 05/26/2018, 128.8 (ratio 0.17) on 08/06/2018, 181.5 (ratio 0.13) on 10/08/2018, 130.9 (ratio 0.13) on 10/20/2018, 160.7 (ratio 0.10)on 12/09/2018, 236.6 (ratio 0.07) on 02/01/2019, 363.6 (ratio 0.04) on 03/22/2019, 404.8 (ratio 0.04) on 04/05/2019, 420.7 (ratio 0.03) on 05/24/2019, 573.4 (ratio 0.03) on 06/23/2019, 451.05(ratio 0.02) on02/19/2021, 47.2 (ratio 0.13) on 10/25/2019, 22.4 (ratio 0.21) on 11/25/2019, 16.5 (ratio 0.33) on 01/10/2020, 14.6 (ratio 0.34) on 02/07/2020, 13.1 (ratio 0.31) on 03/07/2020, 10.1 (ratio 0.38) on 04/10/2020, and 9.5 (ratio 0.21) on 06/19/2020.  24 hour UPEPon 06/03/2019 revealed kappa free light chains95.76,lambda free light chains1,260.71, andratio 0.08.24 hourUPEPon 02/22/2021revealed totalproteinof753m/24 hrs withlambdafree light chains 1,084.144mL andratioof0.10(1.03-31.76). M spike inurinewas46.1%(36167m4 hrs).  Bone surveyon 04/08/2016 and11/28/2018 revealed no definite lytic lesion seen in the visualized skeleton.Bone surveyon 11/19/2018 revealed no suspicious lucent lesionsand  no acute bony abnormality.PET scanon 07/12/2019 revealed no focal metabolic activity to suggest active myeloma within the skeleton. There wereno lytic lesions identified on the CT portion of the examorsoft tissue plasmacytomas. There was no evidence of multiple myeloma.  Pretreatment RBC phenotypeon 09/23/2019 waspositivefor C, e, DUFFY B, KIDD B, M, S, and s antigen; negativefor c, E, KELL, DUFFY A, KIDD A, and N antigen.  Shereceived 6 cycles of daratumumab and hyaluronidase-fihj, Pomalyst, and Decadron (DPd)(09/27/2019 - 10/25/2019; 12/09/2019 - 03/13/2020). Cycle #1 was complicated by fever and neutropenia requiring admission.  Cycle #2 was complicated by pneumonia requiring admission.  Cycle #6 was complicated with an ER evaluation for an elevated lactic acid.  She is day 103 s/p second autologous stem [facility].  She underwent conditioning with melphalan 140 mg/m2.     She has severe aortic stenosis.  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 Cardiology is following for a possible TAVR in the future.  Echo on 07/05/2020 revealed moderate to severe aortic stenosis with an EF of 50-55%.  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 Zometa was discontinued in 01/2017. She has chronic nauseaon Phenergan.  She has B12 deficiency. B12 was 254 on 04/09/2017, 295 on 08/20/2018, and 391 on 10/08/2018. Shewasonoral B12.She received B12 monthly (last02/17/2022).Folate was12.1on 01/10/2020.  She has iron deficiency. Ferritin was 32 on 07/01/2019. She received Venoferon 07/15/2019 and 07/22/2019.  She has hypogammaglobulinemia.  IgG was 245 on 11/25/2019.  She received monthly IVIG (07/22/021 - 03/22/2020).  She received  IVIG 400 mg/kg on 01/20/2020, 200 mg/kg on 02/17/2020, and 300 mg/kg on 03/22/2020.  IVIG on 17/79/3903 was complicated by  acute renal failure. IgG trough level was 418 on 02/17/2020.  She was admitted to [facility] 10/15/2019 - 10/20/2019 with fever and neutropenia. Cultures were negative. CXR was negative. She received broad spectrum antibiotics and daily Granix. She received IVF for acute renal insufficiency due to diarrhea and dehydration. Creatine was 1.66 on admission and 1.12 on discharge.  She was admitted to [facility] from 03/01/2020 - 03/05/2020 with fever and neutropenia. CXR revealed no active cardiopulmonary disease. Chest CT with contrast revealed no acute intrathoracic pathology. There were findings which could be suggestive of prior granulomatous disease. She was treated with Cefepime and Vancomycin, then switched to ciprofloxacin on 03/03/2020.   The patient was admitted to [facility] from 05/08/2020 - 06/04/2020 for autologous bone marrow transplant.   She received conditioning with melphalan 140 mg/m2.  Bone marrow transplant was on 05/10/2020. The patient developed fevers daily from 05/19/2020 - 05/24/2020. Blood cultures were + for strept sanguis bacteremia.  She was treated cefepime, vancomycin, and solumedrol for possible engraftment syndrome. Cefepime was switched to ceftriaxone; she completed 2 weeks of ceftriaxone on 06/03/2020. She had chemotherapy induced diarrhea.  She experienced urinary retention.  She received the Delta 09/20/2019 and 10/09/2019. She received the Booster on 08/10/2020. She received the tetanus shot on 08/20/2020. She received the shingles vaccine recently.  Symptomatically, she feels "ok". She had a fall yesterday. She has had mild diarrhea over the past two days which has resolved. Her neuropathy is stable.   Plan: 1.   Labs today: CBC with diff, CMP, SPEP, FLCA, Mg. 2. Stage III multiple myeloma  Symptomatically, as she is doing fair   She has a lingering neuropathy.   Exam is stable.  Lambda free light chains have improved  from451.05on02/19/2021. After cycle #1: 47.2 (ratio 0.13). After cycle #6:10.1 (ratio 0.38).   After transplant: 6.4 (ratio 0.28).  She is day 103 s/p stem cell transplant #2 (05/10/2020).  Infection prophylaxis   Valtrex through 1 year s/p transplant.  Vaccination   She has received 2 month vaccines.  Post transplant RN coordinator [phone]).  Counts have recovered.  She is transfusion independent.  Review plans for maintenance Velcade every 2 weeks.   Phone follow-up with Dr. Fatima Sanger regarding concerns about neuropathy.   Preauth Velcade.  Discuss symptom management.  She has antiemetics at home to use on a prn bases.  Interventions are adequate.    3.   Normocytic anemia  Hematocrit 31.5.  Hemoglobin 10.5.  MCV 86.5 on 07/13/2020.  Continue to monitor. 4. Grade II peripheral neuropathy  Neuropathy is slightly worse s/p 2nd transplant.  Monitor closely with initiation of Velcade maintenance. 5.   Hypogammaglobulinemia             She has had recurrent infections.               She received 4 doses of IVIG (last 04/24/2020). Goal IgGtrough level is 500.   Follow-up with transplant team regarding monthly IVIG and possible supplementation.   Consider if issues regarding recurrent infection 6.   Chronic hypomagnesemia Magnesium1.3 today. Magnesium 4 gm IV today  Continue magnesium checks twice weekly (Mondays and Thursdays). 7.   Chronic nausea  Continue Phenergan oral and IV prn when she is in clinic.  Phenergan 25 mg IV today. 8.   Aortic stenosis  Echo on 07/05/2020 revealed moderate to severe aortic  stenosis with an EF of 50-55%.  She has a follow-up echo in 6 months. 9.   B12 deficiency  Patient receives B12 monthly (last 07/18/2020).  B12 today and monthly x 6. 10.   Magnesium 4 gm IV. 11.   Phenergan 25 mg IV. 12.   RTC as scheduled for on 08/31/2020 for MD  assess, labs, and day 1 of maintenance Velcade s/p transplant.  I discussed the assessment and treatment plan with the patient.  The patient was provided an opportunity to ask questions and all were answered.  The patient agreed with the plan and demonstrated an understanding of the instructions.  The patient was advised to call back if the symptoms worsen or if the condition fails to improve as anticipated.    Lequita Asal, MD, PhD    08/21/2020, 9:32 AM   I, Mirian Mo Tufford, am acting as Education administrator for [employer]. Mike Gip, MD, PhD.  I, Vlad Mayberry C. Mike Gip, MD, have reviewed the above documentation for accuracy and completeness, and I agree with the above.

## 2020-08-17 NOTE — Progress Notes (Signed)
Patient received prescribed treatment in clinic. 4 grams IV magnesium over 2 hours, 25 mg IV phenergan, and B12 injection IM. Tolerated well. Patient stable at discharge.

## 2020-08-18 LAB — KAPPA/LAMBDA LIGHT CHAINS
Kappa free light chain: 1.8 mg/L — ABNORMAL LOW (ref 3.3–19.4)
Kappa, lambda light chain ratio: 0.28 (ref 0.26–1.65)
Lambda free light chains: 6.4 mg/L (ref 5.7–26.3)

## 2020-08-20 ENCOUNTER — Other Ambulatory Visit: Payer: Self-pay

## 2020-08-20 ENCOUNTER — Ambulatory Visit
Admission: EM | Admit: 2020-08-20 | Discharge: 2020-08-20 | Disposition: A | Discharge status: Home / Self Care | Payer: Medicare HMO | Attending: Physician Assistant | Admitting: Physician Assistant

## 2020-08-20 ENCOUNTER — Encounter: Payer: Self-pay | Admitting: Emergency Medicine

## 2020-08-20 DIAGNOSIS — E785 Hyperlipidemia, unspecified: Secondary | ICD-10-CM | POA: Diagnosis not present

## 2020-08-20 DIAGNOSIS — R55 Syncope and collapse: Secondary | ICD-10-CM | POA: Diagnosis not present

## 2020-08-20 DIAGNOSIS — S0990XA Unspecified injury of head, initial encounter: Secondary | ICD-10-CM

## 2020-08-20 DIAGNOSIS — W19XXXA Unspecified fall, initial encounter: Secondary | ICD-10-CM | POA: Diagnosis not present

## 2020-08-20 DIAGNOSIS — S199XXA Unspecified injury of neck, initial encounter: Secondary | ICD-10-CM | POA: Diagnosis not present

## 2020-08-20 DIAGNOSIS — Z23 Encounter for immunization: Secondary | ICD-10-CM | POA: Diagnosis not present

## 2020-08-20 DIAGNOSIS — S0101XA Laceration without foreign body of scalp, initial encounter: Secondary | ICD-10-CM

## 2020-08-20 DIAGNOSIS — I2510 Atherosclerotic heart disease of native coronary artery without angina pectoris: Secondary | ICD-10-CM | POA: Diagnosis not present

## 2020-08-20 DIAGNOSIS — I509 Heart failure, unspecified: Secondary | ICD-10-CM | POA: Diagnosis not present

## 2020-08-20 DIAGNOSIS — N189 Chronic kidney disease, unspecified: Secondary | ICD-10-CM | POA: Diagnosis not present

## 2020-08-20 DIAGNOSIS — G629 Polyneuropathy, unspecified: Secondary | ICD-10-CM | POA: Insufficient documentation

## 2020-08-20 DIAGNOSIS — R519 Headache, unspecified: Secondary | ICD-10-CM | POA: Diagnosis not present

## 2020-08-20 DIAGNOSIS — Z885 Allergy status to narcotic agent status: Secondary | ICD-10-CM | POA: Diagnosis not present

## 2020-08-20 DIAGNOSIS — Z87891 Personal history of nicotine dependence: Secondary | ICD-10-CM | POA: Diagnosis not present

## 2020-08-20 DIAGNOSIS — R42 Dizziness and giddiness: Secondary | ICD-10-CM | POA: Diagnosis not present

## 2020-08-20 MED ORDER — TETANUS-DIPHTHERIA TOXOIDS TD 5-2 LFU IM INJ
0.5000 mL | INJECTION | Freq: Once | INTRAMUSCULAR | Status: AC
  Administered 2020-08-20: 0.5 mL via INTRAMUSCULAR

## 2020-08-20 NOTE — ED Triage Notes (Signed)
Patient states that her legs buckled under her and she fell backwards and hit her head on the corner of the bathroom wall today around 1145 this morning. Patient has minimal bleeding.

## 2020-08-20 NOTE — Discharge Instructions (Addendum)
HEAD INJURY: I have also discussed care of head injury, as well as red flags for emergent treatment which would mean that he needed to go to the ER immediately. Some red flags include; increased confusion, memory loss, lethargy, vision changes, nausea and vomiting, balance difficulty, extremity weakness, numbness/tingling/burning, severe headache or neck pain. I have strongly suggested that someone monitor the patient closely for the next 24-48 hours   LACERATION: Advised patient to follow up in 7 days for staple removal or sooner if there are any signs of infection such as fever, increased pain/redness/soft tissue swelling, or drainage from the laceration site.  May follow up with PCP to have staples removed or urgent care sooner if condition changes or signs of infection.

## 2020-08-20 NOTE — ED Provider Notes (Signed)
[facility]    CSN: [id number] Arrival date & time: 08/20/20  1225      History   Chief Complaint Chief Complaint  Patient presents with  . Fall  . Head Laceration    HPI Sarah [name] is a 65 y.o. female presenting for laceration of scalp following a fall 2 hours ago. Patient says that she lost her balance and fell backward and hit her head on the corner of a wall. Denies LOC. Admits to mild headache at this time. Denies any associated vision changes, confusion, lethargy, dizziness, n/t/w, chest pain, SOB, n/v. Patient is not taking any anticoagulants. Her sister is with her today and denies any confusion, gait or speech problems. Patient's past medical history is significant for multiple myeloma not in remission and status post bone marrow transplant, CHF, CKD, frequent falls, diabetes, and hypertension.  Patient is currently undergoing chemotherapy with Nolon Stalls, MD. patient states she has an appointment at the cancer center tomorrow.  Patient states that since she has had bone marrow transplant she has had to repeat most of her vaccinations.  Patient states she thinks she had her last tetanus immunization greater than 3 years ago and would like to update that today.  Patient has no other injuries, complaints, or concerns at this time.  HPI  Past Medical History:  Diagnosis Date  . Abnormal stress test 02/14/2016   Overview:  Added automatically from request for surgery [id number]  . Anemia   . Anxiety   . Arthritis   . Bicuspid aortic valve   . CHF (congestive heart failure) ([place])   . CKD (chronic kidney disease) stage 3, GFR 30-59 ml/min (HCC)   . Depression   . Diabetes mellitus ([place])   . Dizziness   . Fatty liver   . Frequent falls   . GERD (gastroesophageal reflux disease)   . Gout   . Heart murmur   . History of blood transfusion   . History of bone marrow transplant ([place])   . History of uterine fibroid   . Hx of cardiac catheterization 06/05/2016    Overview:  Normal coronaries 2017  . Hypertension   . Hypomagnesemia   . Multiple myeloma ([place])   . Personal history of chemotherapy   . Renal cyst     Patient Active Problem List   Diagnosis Date Noted  . Neuropathy 08/20/2020  . Yeast vaginitis 03/18/2020  . Hypotension 03/18/2020  . Hypokalemia 03/11/2020  . Malnutrition of moderate degree 03/03/2020  . Antineoplastic chemotherapy induced pancytopenia ([place]) 03/03/2020  . Dyspnea 03/02/2020  . Physical deconditioning 02/07/2020  . Impaired nutrition 02/07/2020  . 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 Constipation 10/24/2019  . Encounter for antineoplastic immunotherapy 10/03/2019  . Hypocalcemia 08/28/2019  . Stage 3 chronic kidney disease ([place]) 05/12/2019  . Chronic nausea 12/17/2018  . Chemotherapy-induced neuropathy ([place]) 12/17/2018  . Hyperlipidemia associated with type 2 diabetes mellitus ([place]) 11/25/2018  . Normocytic anemia 10/21/2018  . Hypomagnesemia 08/20/2018  . Goals of care, counseling/discussion 08/20/2018  . B12 deficiency 08/20/2018  . Thrombocytopenia ([place]) 02/09/2018  . Benign essential HTN 08/21/2017  . Cardiomyopathy, idiopathic ([place]) 08/21/2017  . Carotid artery stenosis, asymptomatic, bilateral 08/06/2017  . Thyroid nodule 08/06/2017  . Cardiac syncope 07/25/2017  . Iron deficiency anemia due to chronic blood loss 05/13/2017  . Spinal stenosis of lumbar region with neurogenic claudication 04/09/2017  . 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 LBBB (left bundle branch block) 10/10/2016  . Long term  prescription opiate use 09/07/2016  . Chronic pain syndrome 07/08/2016  . Pain medication agreement signed 07/08/2016  . Spondylosis of lumbar region without myelopathy or radiculopathy 07/08/2016  . Chronic diastolic CHF (congestive heart failure), NYHA class 2 ([place]) 06/05/2016  . LVH (left ventricular hypertrophy) due to hypertensive disease, with heart failure  ([place]) 06/05/2016  . Severe aortic stenosis 06/05/2016  . 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[place]) 06/05/2016  . Environmental and seasonal allergies 04/19/2016  . Insomnia 04/19/2016  . Bicuspid aortic valve 04/19/2016  . Asthma 04/19/2016  . Aortic regurgitation 04/19/2016  . Type II diabetes mellitus with complication ([place]) 64/33/2951  . Depression, major, single episode, in partial remission ([place]) 04/12/2016  . Irritable bowel syndrome (IBS) 04/12/2016  . Hepatic steatosis 04/12/2016  . Multiple myeloma not having achieved remission ([place]) 04/02/2016  . 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[place]) 07/06/2015  . Stem cell transplant candidate 05/16/2015    Past Surgical History:  Procedure Laterality Date  . ABDOMINAL HYSTERECTOMY    . 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 CARDIAC ELECTROPHYSIOLOGY MAPPING AND ABLATION    . CARPAL TUNNEL RELEASE Bilateral   . CHOLECYSTECTOMY  2008  . COLONOSCOPY WITH PROPOFOL N/A 05/07/2017   Procedure: COLONOSCOPY WITH PROPOFOL;  Surgeon: [name], MD;  Location: [facility] ENDOSCOPY;  Service: Gastroenterology;  Laterality: N/A;  . ESOPHAGOGASTRODUODENOSCOPY (EGD) WITH PROPOFOL N/A 05/07/2017   Procedure: ESOPHAGOGASTRODUODENOSCOPY (EGD) WITH PROPOFOL;  Surgeon: [name], MD;  Location: [facility] ENDOSCOPY;  Service: Gastroenterology;  Laterality: N/A;  . FOOT SURGERY Bilateral   . INCONTINENCE SURGERY  2009  . INTERSTIM IMPLANT PLACEMENT    . other     over active bladder  . OTHER SURGICAL HISTORY     bladder stimulator   . PARTIAL HYSTERECTOMY  03/1996   fibroids  . PORTA CATH INSERTION N/A 03/10/2019   Procedure: PORTA CATH INSERTION;  Surgeon: Algernon Huxley, MD;  Location: [facility];  Service: Cardiovascular;  Laterality: N/A;  . TONSILLECTOMY  2007    OB History   No obstetric history on file.      Home Medications    Prior to Admission medications   Medication Sig Start Date End Date Taking? Authorizing Provider  bisoprolol (ZEBETA)  5 MG tablet Take by mouth. 07/05/20  Yes [provider]  FLUoxetine (PROZAC) 40 MG capsule TAKE 1 CAPSULE EVERY DAY 05/09/20  Yes Glean Hess, MD  methocarbamol (ROBAXIN) 500 MG tablet TAKE 1 TABLET FOUR TIMES DAILY. 07/12/20  Yes Glean Hess, MD  Multiple Vitamin (MULTIVITAMIN) tablet Take 1 tablet by mouth daily.   Yes [provider]  Multiple Vitamins-Minerals (HAIR/SKIN/NAILS) TABS Take 1 tablet by mouth daily.   Yes [provider]  omeprazole (PRILOSEC) 40 MG capsule TAKE 1 CAPSULE EVERY DAY 11/21/19  Yes Glean Hess, MD  promethazine (PHENERGAN) 25 MG tablet TAKE 1 TABLET BY MOUTH TWICE DAILY BEFORE MEALS AS NEEDED 07/31/20  Yes Corcoran, Drue Second, MD  acetaminophen (TYLENOL) 500 MG tablet Take 500 mg by mouth every 6 (six) hours as needed.     [provider]  allopurinol (ZYLOPRIM) 100 MG tablet TAKE 1 TABLET(100 MG) BY MOUTH DAILY as needed 12/02/18   Glean Hess, MD  aspirin 81 MG chewable tablet Chew 1 tablet (81 mg total) by mouth daily. Patient not taking: No sig reported 07/27/17   Gladstone Lighter, MD  diclofenac sodium (VOLTAREN) 1 % GEL Apply 2 g topically 4 (four) times daily. 01/25/19  Gertie Baron, NP  diphenhydrAMINE (BENADRYL) 50 MG capsule Take 50 mg by mouth as needed.  Patient not taking: No sig reported    [provider]  diphenoxylate-atropine (LOMOTIL) 2.5-0.025 MG tablet Take 2 tablets by mouth daily as needed for diarrhea or loose stools. 09/23/19   Lequita Asal, MD  fentaNYL (DURAGESIC - DOSED MCG/HR) 25 MCG/HR patch Place 25 mcg onto the skin every 3 (three) days.  Patient not taking: No sig reported 08/12/16   [provider]  fexofenadine (ALLEGRA) 180 MG tablet TAKE 1 TABLET(180 MG) BY MOUTH DAILY 05/30/17   Glean Hess, MD  fluticasone [facility]) 50 MCG/ACT nasal spray Place 2 sprays into both nostrils daily. 07/10/18   Glean Hess, MD  glucose blood test strip  09/04/15    [provider]  lactulose (CHRONULAC) 10 GM/15ML solution Take 20 g by mouth daily as needed.  10/09/19   [provider]  lidocaine-prilocaine (EMLA) cream APPLY TOPICALLY AS NEEDED. 04/07/19   Lequita Asal, MD  metFORMIN (GLUCOPHAGE-XR) 500 MG 24 hr tablet TAKE 1 TABLET EVERY DAY WITH BREAKFAST Patient not taking: No sig reported 07/12/20   Glean Hess, MD  promethazine (PHENERGAN) 25 MG tablet Take 1 tablet (25 mg total) by mouth every 6 (six) hours as needed for nausea or vomiting. 07/31/20   Lequita Asal, MD  promethazine-dextromethorphan (PROMETHAZINE-DM) 6.25-15 MG/5ML syrup Take 5 mLs by mouth 4 (four) times daily as needed for cough. 11/18/19   Lequita Asal, MD  tiZANidine (ZANAFLEX) 4 MG tablet Take 1 tablet (4 mg total) by mouth every 8 (eight) hours as needed for muscle spasms. 12/02/18   Glean Hess, MD  traZODone (DESYREL) 100 MG tablet Take 1 tablet (100 mg total) by mouth at bedtime. Patient taking differently: Take 200 mg by mouth at bedtime. 08/09/20   Glean Hess, MD  montelukast (SINGULAIR) 10 MG tablet Take 1 tablet by mouth on the day before treatment and for 2 days after treatment. 12/16/19 08/16/20  Lequita Asal, MD    Family History Family History  Problem Relation Age of Onset  . Colon cancer Father   . Renal Disease Father   . Diabetes Mellitus II Father   . Melanoma Paternal Grandmother   . Breast cancer Maternal Aunt 53  . Anemia Mother   . Heart disease Mother   . Heart failure Mother   . Renal Disease Mother   . Congestive Heart Failure Mother   . Heart disease Maternal Uncle   . Throat cancer Maternal Uncle   . Lung cancer Maternal Uncle   . Liver disease Maternal Uncle   . Heart failure Maternal Uncle   . Hearing loss Son 33       Suicide     Social History Social History   Tobacco Use  . Smoking status: Former Smoker    Packs/day: 1.00    Years: 20.00    Pack years: 20.00    Types:  Cigarettes    Quit date: 07/02/1991    Years since quitting: 29.1  . Smokeless tobacco: Never Used  Vaping Use  . Vaping Use: Never used  Substance Use Topics  . Alcohol use: Yes    Comment: occasionally  . Drug use: No     Allergies   Oxycodone-acetaminophen, Celebrex [celecoxib], Codeine, Plerixafor, Benadryl [diphenhydramine], Morphine, Ondansetron, and Tylenol [acetaminophen]   Review of Systems Review of Systems  Constitutional: Negative for fatigue and fever.  HENT:  Negative for rhinorrhea.   Eyes: Negative for photophobia and visual disturbance.  Respiratory: Negative for shortness of breath.   Cardiovascular: Negative for chest pain and palpitations.  Gastrointestinal: Negative for abdominal pain, nausea and vomiting.  Skin: Positive for wound.  Neurological: Positive for headaches. Negative for dizziness, syncope, facial asymmetry, speech difficulty, weakness, light-headedness and numbness.  Psychiatric/Behavioral: Negative for confusion and dysphoric mood.     Physical Exam Triage Vital Signs ED Triage Vitals  Enc Vitals Group     BP 08/20/20 1238 114/65     Pulse Rate 08/20/20 1238 69     Resp 08/20/20 1238 14     Temp 08/20/20 1238 98.3 F (36.8 C)     Temp Source 08/20/20 1238 Oral     SpO2 08/20/20 1238 100 %     Weight 08/20/20 1236 145 lb (65.8 kg)     Height 08/20/20 1236 5' 2.5" (1.588 m)     Head Circumference --      Peak Flow --      Pain Score 08/20/20 1236 7     Pain Loc --      Pain Edu? --      Excl. in [place]? --    No data found.  Updated Vital Signs BP 114/65 (BP Location: Left Arm)   Pulse 69   Temp 98.3 F (36.8 C) (Oral)   Resp 14   Ht 5' 2.5" (1.588 m)   Wt 145 lb (65.8 kg)   SpO2 100%   BMI 26.10 kg/m       Physical Exam Vitals and nursing note reviewed.  Constitutional:      General: She is not in acute distress.    Appearance: Normal appearance. She is not ill-appearing or toxic-appearing.  HENT:     Head:      Comments: 2.5 cm laceration central parietal region of scalp. Minimal bleeding. Area is tender    Right Ear: Tympanic membrane, ear canal and external ear normal.     Left Ear: Tympanic membrane, ear canal and external ear normal.     Nose: Nose normal.     Mouth/Throat:     Mouth: Mucous membranes are moist.     Pharynx: Oropharynx is clear.  Eyes:     General: No scleral icterus.       Right eye: No discharge.        Left eye: No discharge.     Extraocular Movements: Extraocular movements intact.     Conjunctiva/sclera: Conjunctivae normal.     Pupils: Pupils are equal, round, and reactive to light.  Cardiovascular:     Rate and Rhythm: Normal rate and regular rhythm.     Pulses: Normal pulses.     Heart sounds: Normal heart sounds.  Pulmonary:     Effort: Pulmonary effort is normal. No respiratory distress.     Breath sounds: Normal breath sounds. No wheezing, rhonchi or rales.  Musculoskeletal:     Cervical back: Normal range of motion and neck supple. No tenderness.     Comments: 5/5 strength bilat UEs and LEs  Skin:    General: Skin is dry.  Neurological:     General: No focal deficit present.     Mental Status: She is alert and oriented to person, place, and time. Mental status is at baseline.     Cranial Nerves: No cranial nerve deficit.     Sensory: No sensory deficit.     Motor: No weakness or pronator drift.  Coordination: Coordination is intact. Coordination normal.     Gait: Gait normal.  Psychiatric:        Mood and Affect: Mood normal.        Behavior: Behavior normal.        Thought Content: Thought content normal.      UC Treatments / Results  Labs (all labs ordered are listed, but only abnormal results are displayed) Labs Reviewed - No data to display  EKG   Radiology No results found.  Procedures Laceration Repair  Date/Time: 08/20/2020 2:51 PM Performed by: Danton Clap, PA-C Authorized by: Danton Clap, PA-C   Consent:     Consent obtained:  Verbal   Consent given by:  Patient   Risks, benefits, and alternatives were discussed: yes     Risks discussed:  Infection, pain, retained foreign body, poor cosmetic result and poor wound healing   Alternatives discussed:  No treatment, delayed treatment and observation Universal protocol:    Procedure explained and questions answered to patient or proxy's satisfaction: yes     Patient identity confirmed:  Verbally with patient and arm band Anesthesia:    Anesthesia method:  Local infiltration   Local anesthetic:  Lidocaine 1% WITH epi Laceration details:    Location:  Scalp   Scalp location:  Mid-scalp   Length (cm):  2.5   Depth (mm):  3 Pre-procedure details:    Preparation:  Patient was prepped and draped in usual sterile fashion Exploration:    Hemostasis achieved with:  Direct pressure   Wound exploration: entire depth of wound visualized     Contaminated: no   Treatment:    Area cleansed with:  Saline   Amount of cleaning:  Extensive   Irrigation solution:  Sterile saline   Visualized foreign bodies/material removed: no     Debridement:  None Skin repair:    Repair method:  Staples   Number of staples:  7 Approximation:    Approximation:  Close Repair type:    Repair type:  Simple Post-procedure details:    Dressing:  Antibiotic ointment   Procedure completion:  Tolerated well, no immediate complications   (including critical care time)  Medications Ordered in UC Medications  tetanus & diphtheria toxoids (adult) (TENIVAC) injection 0.5 mL (0.5 mLs Intramuscular Given 08/20/20 1320)    Initial Impression / Assessment and Plan / UC Course  I have reviewed the triage vital signs and the nursing notes.  Pertinent labs & imaging results that were available during my care of the patient were reviewed by me and considered in my medical decision making (see chart for details).   65 year old female presenting with her sister for scalp laceration  following a fall about 2 hours prior to arrival to urgent care.  Patient admits to mild headache.  No other symptoms.  Her neurological exam is within normal limits.  She is overall well-appearing and cooperative.  Spoke with patient about getting possible CT scan of her head since she is over 60 and did have a traumatic fall.  Advised her of her options including going to ED at this time to have CT scan of head and subsequent repair of the laceration or I can repair the laceration at this time since her neurological exam is normal and her sister is able to monitor her over the next few days.  Advised that she will develop any signs of significant head injury to call EMS or have someone take her to the ED.  Patient opted for a second option.  Patient does have an appointment at the cancer center in the morning and states that she will mention this injury to her doctor.  Repaired laceration with 7 staples.  Discussed care of staples with patient.  Reviewed the wound care guidelines.  Advised not to get staples but for 2 to 3 days.  Advised to monitor for infection and follow-up with [place] or PCP if signs of infection.  Have sutures removed in 10 days.  Patient asked for her tetanus to be updated today since she had bone marrow transplant and says she has had to have repeat vaccinations.  Td updated today.  Patient is leaving the clinic in stable condition.  Her sister feels competent to care for her and look out for any red flags which would mean she would need to take her to the ED.   Final Clinical Impressions(s) / UC Diagnoses   Final diagnoses:  Laceration of scalp, initial encounter  Injury of head, initial encounter     Discharge Instructions     HEAD INJURY: I have also discussed care of head injury, as well as red flags for emergent treatment which would mean that he needed to go to the ER immediately. Some red flags include; increased confusion, memory loss, lethargy, vision changes,  nausea and vomiting, balance difficulty, extremity weakness, numbness/tingling/burning, severe headache or neck pain. I have strongly suggested that someone monitor the patient closely for the next 24-48 hours   LACERATION: Advised patient to follow up in 7 days for staple removal or sooner if there are any signs of infection such as fever, increased pain/redness/soft tissue swelling, or drainage from the laceration site.  May follow up with PCP to have staples removed or urgent care sooner if condition changes or signs of infection.     ED Prescriptions    None     PDMP not reviewed this encounter.   Danton Clap, PA-C 08/20/20 1459

## 2020-08-21 ENCOUNTER — Inpatient Hospital Stay: Payer: Medicare HMO

## 2020-08-21 ENCOUNTER — Inpatient Hospital Stay (HOSPITAL_BASED_OUTPATIENT_CLINIC_OR_DEPARTMENT_OTHER): Payer: Medicare HMO | Admitting: Hematology and Oncology

## 2020-08-21 ENCOUNTER — Encounter: Payer: Self-pay | Admitting: Hematology and Oncology

## 2020-08-21 VITALS — BP 133/69 | HR 80 | Temp 96.6°F | Resp 18 | Ht 62.5 in | Wt 151.0 lb

## 2020-08-21 DIAGNOSIS — R110 Nausea: Secondary | ICD-10-CM

## 2020-08-21 DIAGNOSIS — Z95828 Presence of other vascular implants and grafts: Secondary | ICD-10-CM

## 2020-08-21 DIAGNOSIS — C9001 Multiple myeloma in remission: Secondary | ICD-10-CM | POA: Diagnosis not present

## 2020-08-21 DIAGNOSIS — Z9484 Stem cells transplant status: Secondary | ICD-10-CM | POA: Diagnosis not present

## 2020-08-21 DIAGNOSIS — E538 Deficiency of other specified B group vitamins: Secondary | ICD-10-CM

## 2020-08-21 DIAGNOSIS — C9000 Multiple myeloma not having achieved remission: Secondary | ICD-10-CM | POA: Diagnosis not present

## 2020-08-21 DIAGNOSIS — G629 Polyneuropathy, unspecified: Secondary | ICD-10-CM | POA: Diagnosis not present

## 2020-08-21 LAB — MULTIPLE MYELOMA PANEL, SERUM
Albumin SerPl Elph-Mcnc: 3.7 g/dL (ref 2.9–4.4)
Albumin/Glob SerPl: 1.7 (ref 0.7–1.7)
Alpha 1: 0.2 g/dL (ref 0.0–0.4)
Alpha2 Glob SerPl Elph-Mcnc: 0.9 g/dL (ref 0.4–1.0)
B-Globulin SerPl Elph-Mcnc: 0.9 g/dL (ref 0.7–1.3)
Gamma Glob SerPl Elph-Mcnc: 0.2 g/dL — ABNORMAL LOW (ref 0.4–1.8)
Globulin, Total: 2.2 g/dL (ref 2.2–3.9)
IgA: 7 mg/dL — ABNORMAL LOW (ref 87–352)
IgG (Immunoglobin G), Serum: 195 mg/dL — ABNORMAL LOW (ref 586–1602)
IgM (Immunoglobulin M), Srm: 6 mg/dL — ABNORMAL LOW (ref 26–217)
Total Protein ELP: 5.9 g/dL — ABNORMAL LOW (ref 6.0–8.5)

## 2020-08-21 LAB — MAGNESIUM: Magnesium: 1.3 mg/dL — ABNORMAL LOW (ref 1.7–2.4)

## 2020-08-21 MED ORDER — SODIUM CHLORIDE 0.9 % IV SOLN
Freq: Once | INTRAVENOUS | Status: AC
  Filled 2020-08-21: qty 250

## 2020-08-21 MED ORDER — HEPARIN SOD (PORK) LOCK FLUSH 100 UNIT/ML IV SOLN
500.0000 [IU] | Freq: Once | INTRAVENOUS | Status: AC
  Administered 2020-08-21: 500 [IU] via INTRAVENOUS
  Filled 2020-08-21: qty 5

## 2020-08-21 MED ORDER — SODIUM CHLORIDE 0.9% FLUSH
10.0000 mL | INTRAVENOUS | Status: DC | PRN
  Administered 2020-08-21: 10 mL via INTRAVENOUS
  Filled 2020-08-21: qty 10

## 2020-08-21 MED ORDER — MAGNESIUM SULFATE 4 GM/100ML IV SOLN
4.0000 g | Freq: Once | INTRAVENOUS | Status: AC
  Administered 2020-08-21: 4 g via INTRAVENOUS

## 2020-08-21 MED ORDER — MAGNESIUM SULFATE 4 GM/100ML IV SOLN
INTRAVENOUS | Status: AC
  Filled 2020-08-21: qty 100

## 2020-08-21 MED ORDER — PROMETHAZINE HCL 25 MG/ML IJ SOLN
25.0000 mg | Freq: Once | INTRAMUSCULAR | Status: AC
  Administered 2020-08-21: 25 mg via INTRAVENOUS
  Filled 2020-08-21: qty 1

## 2020-08-21 NOTE — Progress Notes (Signed)
Patient sustained a fall to head. Staples present. She will go to acute care next week to get the staples removed.

## 2020-08-24 ENCOUNTER — Inpatient Hospital Stay: Payer: Medicare HMO

## 2020-08-24 ENCOUNTER — Other Ambulatory Visit: Payer: Self-pay

## 2020-08-24 DIAGNOSIS — Z95828 Presence of other vascular implants and grafts: Secondary | ICD-10-CM

## 2020-08-24 DIAGNOSIS — C9001 Multiple myeloma in remission: Secondary | ICD-10-CM

## 2020-08-24 DIAGNOSIS — C9000 Multiple myeloma not having achieved remission: Secondary | ICD-10-CM | POA: Diagnosis not present

## 2020-08-24 DIAGNOSIS — R110 Nausea: Secondary | ICD-10-CM

## 2020-08-24 LAB — MAGNESIUM: Magnesium: 1.4 mg/dL — ABNORMAL LOW (ref 1.7–2.4)

## 2020-08-24 MED ORDER — PROMETHAZINE HCL 25 MG/ML IJ SOLN
25.0000 mg | Freq: Once | INTRAMUSCULAR | Status: AC
  Administered 2020-08-24: 25 mg via INTRAVENOUS
  Filled 2020-08-24: qty 1

## 2020-08-24 MED ORDER — MAGNESIUM SULFATE 4 GM/100ML IV SOLN
4.0000 g | Freq: Once | INTRAVENOUS | Status: AC
  Administered 2020-08-24: 4 g via INTRAVENOUS
  Filled 2020-08-24: qty 100

## 2020-08-24 MED ORDER — HEPARIN SOD (PORK) LOCK FLUSH 100 UNIT/ML IV SOLN
500.0000 [IU] | Freq: Once | INTRAVENOUS | Status: AC
  Administered 2020-08-24: 500 [IU] via INTRAVENOUS
  Filled 2020-08-24: qty 5

## 2020-08-24 MED ORDER — SODIUM CHLORIDE 0.9 % IV SOLN
Freq: Once | INTRAVENOUS | Status: AC
  Filled 2020-08-24: qty 250

## 2020-08-24 MED ORDER — SODIUM CHLORIDE 0.9% FLUSH
10.0000 mL | INTRAVENOUS | Status: DC | PRN
  Administered 2020-08-24: 10 mL via INTRAVENOUS
  Filled 2020-08-24: qty 10

## 2020-08-24 NOTE — Progress Notes (Signed)
Patient received prescribed treatment in clinic. 4 grams IV magnesium and 25 mg IV given. Tolerated well. Patient stable at discharge.

## 2020-08-28 ENCOUNTER — Inpatient Hospital Stay: Payer: Medicare HMO

## 2020-08-28 ENCOUNTER — Other Ambulatory Visit: Payer: Self-pay

## 2020-08-28 VITALS — BP 127/77 | HR 78 | Temp 96.7°F | Resp 18

## 2020-08-28 DIAGNOSIS — C9000 Multiple myeloma not having achieved remission: Secondary | ICD-10-CM | POA: Diagnosis not present

## 2020-08-28 DIAGNOSIS — C9001 Multiple myeloma in remission: Secondary | ICD-10-CM

## 2020-08-28 DIAGNOSIS — Z95828 Presence of other vascular implants and grafts: Secondary | ICD-10-CM

## 2020-08-28 DIAGNOSIS — R110 Nausea: Secondary | ICD-10-CM

## 2020-08-28 LAB — MAGNESIUM: Magnesium: 1.5 mg/dL — ABNORMAL LOW (ref 1.7–2.4)

## 2020-08-28 MED ORDER — HEPARIN SOD (PORK) LOCK FLUSH 100 UNIT/ML IV SOLN
500.0000 [IU] | Freq: Once | INTRAVENOUS | Status: AC
  Administered 2020-08-28: 500 [IU] via INTRAVENOUS
  Filled 2020-08-28: qty 5

## 2020-08-28 MED ORDER — MAGNESIUM SULFATE 2 GM/50ML IV SOLN
2.0000 g | Freq: Once | INTRAVENOUS | Status: AC
  Administered 2020-08-28: 2 g via INTRAVENOUS
  Filled 2020-08-28: qty 50

## 2020-08-28 MED ORDER — PROMETHAZINE HCL 25 MG/ML IJ SOLN
25.0000 mg | Freq: Once | INTRAMUSCULAR | Status: AC
  Administered 2020-08-28: 25 mg via INTRAVENOUS
  Filled 2020-08-28: qty 1

## 2020-08-28 MED ORDER — SODIUM CHLORIDE 0.9 % IV SOLN
Freq: Once | INTRAVENOUS | Status: AC
  Filled 2020-08-28: qty 250

## 2020-08-30 ENCOUNTER — Other Ambulatory Visit: Payer: Self-pay

## 2020-08-30 ENCOUNTER — Encounter: Payer: Self-pay | Admitting: Hematology and Oncology

## 2020-08-30 DIAGNOSIS — C9001 Multiple myeloma in remission: Secondary | ICD-10-CM

## 2020-08-30 NOTE — Progress Notes (Signed)
[name]  [address] [place], [place] [address] Phone: [phone]  Fax: [phone]   Clinic Day:  08/31/2020   Referring physician: Glean Hess, MD  Chief Complaint: Sarah [name] is a 65 y.o. 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  HPI: The patient was last seen in the medical oncology clinic on 08/21/2020.  At that time, she felt "okay". She described transient diarrhea. Neuropathy was stable. Magnesium was 1.3. She received 4 gm IV magnesium.  Magnesium was 1.4 on 08/24/2020 and 1.5 on 08/28/2020. She received 4 gm IV magnesium on 08/24/2020 and 2 gm IV magnesium on 08/28/2020.  During the interim, she has been "better." She is still tired and fatigued, but she has been eating more. She reports occasional dizziness. She has pain in her thighs that she feels "goes down to the bone." She has had this pain for a while but it is getting worse. Her back pain is stable (5/10). Her nausea and neuropathy are stable. She has bowel movements once daily. Her diabetes is "okay." She denies falls, fever, and infections.   Past Medical History:  Diagnosis Date  . Abnormal stress test 02/14/2016   Overview:  Added automatically from request for surgery [id number]  . Anemia   . Anxiety   . Arthritis   . Bicuspid aortic valve   . CHF (congestive heart failure) ([place])   . CKD (chronic kidney disease) stage 3, GFR 30-59 ml/min (HCC)   . Depression   . Diabetes mellitus ([place])   . Dizziness   . Fatty liver   . Frequent falls   . GERD (gastroesophageal reflux disease)   . Gout   . Heart murmur   . History of blood transfusion   . History of bone marrow transplant ([place])   . History of uterine fibroid   . Hx of cardiac catheterization 06/05/2016   Overview:  Normal coronaries 2017  . Hypertension   . Hypomagnesemia   . Multiple myeloma ([place])    . Personal history of chemotherapy   . Renal cyst     Past Surgical History:  Procedure Laterality Date  . ABDOMINAL HYSTERECTOMY    . 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 CARDIAC ELECTROPHYSIOLOGY MAPPING AND ABLATION    . CARPAL TUNNEL RELEASE Bilateral   . CHOLECYSTECTOMY  2008  . COLONOSCOPY WITH PROPOFOL N/A 05/07/2017   Procedure: COLONOSCOPY WITH PROPOFOL;  Surgeon: [name], MD;  Location: [facility] ENDOSCOPY;  Service: Gastroenterology;  Laterality: N/A;  . ESOPHAGOGASTRODUODENOSCOPY (EGD) WITH PROPOFOL N/A 05/07/2017   Procedure: ESOPHAGOGASTRODUODENOSCOPY (EGD) WITH PROPOFOL;  Surgeon: [name], MD;  Location: [facility] ENDOSCOPY;  Service: Gastroenterology;  Laterality: N/A;  . FOOT SURGERY Bilateral   . INCONTINENCE SURGERY  2009  . INTERSTIM IMPLANT PLACEMENT    . other     over active bladder  . OTHER SURGICAL HISTORY     bladder stimulator   . PARTIAL HYSTERECTOMY  03/1996   fibroids  . PORTA CATH INSERTION N/A 03/10/2019   Procedure: PORTA CATH INSERTION;  Surgeon: Algernon Huxley, MD;  Location: [facility] CV LAB;  Service: Cardiovascular;  Laterality: N/A;  . TONSILLECTOMY  2007    Family History  Problem Relation Age of Onset  . Colon cancer Father   . Renal Disease Father   . Diabetes Mellitus II Father   . Melanoma Paternal Grandmother   .  Breast cancer Maternal Aunt 2  . Anemia Mother   . Heart disease Mother   . Heart failure Mother   . Renal Disease Mother   . Congestive Heart Failure Mother   . Heart disease Maternal Uncle   . Throat cancer Maternal Uncle   . Lung cancer Maternal Uncle   . Liver disease Maternal Uncle   . Heart failure Maternal Uncle   . Hearing loss Son 9       Suicide     Social History:  reports that she quit smoking about 29 years ago. Her smoking use included cigarettes. She has a 20.00 pack-year smoking history. She has never used smokeless tobacco. She reports current alcohol use. She reports that she does not use  drugs. Patient has not had ETOH in several months. She is on disability. She notes exposure to perchloroethylene [facility]).She lives much of her adult life in [place]. She was married with 2 sons. Her husband passed away. Her 2 sons took their own lives (age 34 and 50). She worked in [employer] in [employer]. She went back to school and earned an [employer] and works for [employer] for several years.[place] lives[place] sistersin [place]. She has two dogs, Harley and Nathrop. The patient is alone today.   Allergies:  Allergies  Allergen Reactions  . Oxycodone-Acetaminophen Anaphylaxis    Swelling and rash  . Celebrex [Celecoxib] Diarrhea  . Codeine   . Plerixafor     In 2016 during ASCT collection patient developed fever to 103.79F and required hospitalization  . Benadryl [Diphenhydramine] Palpitations  . Morphine Itching and Rash  . Ondansetron Diarrhea  . Tylenol [Acetaminophen] Itching and Rash    Current Medications: Current Outpatient Medications  Medication Sig Dispense Refill  . allopurinol (ZYLOPRIM) 100 MG tablet TAKE 1 TABLET(100 MG) BY MOUTH DAILY as needed 90 tablet 1  . bisoprolol (ZEBETA) 5 MG tablet Take 5 mg by mouth daily.    . diphenoxylate-atropine (LOMOTIL) 2.5-0.025 MG tablet Take 2 tablets by mouth daily as needed for diarrhea or loose stools. 45 tablet 2  . fexofenadine (ALLEGRA) 180 MG tablet TAKE 1 TABLET(180 MG) BY MOUTH DAILY 30 tablet 5  . FLUoxetine (PROZAC) 40 MG capsule TAKE 1 CAPSULE EVERY DAY 90 capsule 1  . glucose blood test strip     . metFORMIN (GLUCOPHAGE-XR) 500 MG 24 hr tablet TAKE 1 TABLET EVERY DAY WITH BREAKFAST 90 tablet 1  . methocarbamol (ROBAXIN) 500 MG tablet TAKE 1 TABLET FOUR TIMES DAILY. 120 tablet 0  . Multiple Vitamin (MULTIVITAMIN) tablet Take 1 tablet by mouth daily.    . Multiple Vitamins-Minerals (HAIR/SKIN/NAILS) TABS Take 1 tablet by mouth daily.    Marland Kitchen omeprazole (PRILOSEC) 40 MG capsule TAKE 1 CAPSULE EVERY  DAY 90 capsule 3  . promethazine (PHENERGAN) 25 MG tablet Take 1 tablet (25 mg total) by mouth every 6 (six) hours as needed for nausea or vomiting. 90 tablet 0  . traZODone (DESYREL) 100 MG tablet Take 1 tablet (100 mg total) by mouth at bedtime. (Patient taking differently: Take 200 mg by mouth at bedtime.) 90 tablet 1  . acetaminophen (TYLENOL) 500 MG tablet Take 500 mg by mouth every 6 (six) hours as needed.  (Patient not taking: No sig reported)    . diclofenac sodium (VOLTAREN) 1 % GEL Apply 2 g topically 4 (four) times daily. (Patient not taking: No sig reported) 100 g 1  . diphenhydrAMINE (BENADRYL) 50 MG capsule Take 50 mg by  mouth as needed.  (Patient not taking: No sig reported)    . fluticasone (FLONASE) 50 MCG/ACT nasal spray Place 2 sprays into both nostrils daily. (Patient not taking: No sig reported) 16 g 2  . lactulose (CHRONULAC) 10 GM/15ML solution Take 20 g by mouth daily as needed.  (Patient not taking: No sig reported)     No current facility-administered medications for this visit.   Facility-Administered Medications Ordered in Other Visits  Medication Dose Route Frequency Provider Last Rate Last Admin  . 0.9 %  sodium chloride infusion   Intravenous Continuous Lequita Asal, MD   Stopped at 01/20/20 1232  . heparin lock flush 100 unit/mL  500 Units Intravenous Once Faythe Casa E, NP      . sodium chloride flush (NS) 0.9 % injection 10 mL  10 mL Intravenous PRN Lequita Asal, MD   10 mL at 02/07/20 0815  . sodium chloride flush (NS) 0.9 % injection 10 mL  10 mL Intracatheter PRN Cammie Sickle, MD   10 mL at 02/10/20 0815  . sodium chloride flush (NS) 0.9 % injection 10 mL  10 mL Intravenous PRN Jacquelin Hawking, NP   10 mL at 03/01/20 1143    Review of Systems  Constitutional: Positive for malaise/fatigue. Negative for chills, diaphoresis, fever and weight loss (up 2 lbs).       Feels "better."  HENT: Negative.  Negative for congestion, ear  discharge, ear pain, hearing loss, nosebleeds, sinus pain, sore throat and tinnitus.   Eyes: Negative.  Negative for blurred vision.  Respiratory: Negative for cough, hemoptysis, sputum production, shortness of breath and wheezing.   Cardiovascular: Negative.  Negative for chest pain, palpitations and leg swelling.  Gastrointestinal: Positive for nausea (chronic, stable). Negative for abdominal pain, blood in stool, constipation, diarrhea, heartburn (on Prilosec), melena and vomiting.       Eating better.  Genitourinary: Negative.  Negative for dysuria, frequency and urgency.  Musculoskeletal: Positive for back pain (chronic, 5/10). Negative for joint pain, myalgias and neck pain.       Thigh pain  Skin: Negative.  Negative for itching and rash.  Neurological: Positive for dizziness (occasional) and sensory change (chronic numbness in toes and from fingertips to halfway up forearm, stable). Negative for tingling, weakness and headaches.  Endo/Heme/Allergies: Negative for environmental allergies. Does not bruise/bleed easily.       Diabetes, well controlled.  Psychiatric/Behavioral: Negative.  Negative for depression and memory loss. The patient is not nervous/anxious and does not have insomnia.   All other systems reviewed and are negative.   Performance status (ECOG): 1  Vitals Blood pressure 122/78, pulse 76, temperature (!) 96 F (35.6 C), temperature source Tympanic, resp. rate 16, weight 153 lb 1.8 oz (69.4 kg).  Physical Exam Vitals and nursing note reviewed.  Constitutional:      General: She is not in acute distress.    Appearance: She is well-developed. She is not ill-appearing, toxic-appearing or diaphoretic.     Interventions: Face mask in place.  HENT:     Head: Normocephalic and atraumatic.     Mouth/Throat:     Mouth: Mucous membranes are moist.     Pharynx: Oropharynx is clear.  Eyes:     General: No scleral icterus.    Extraocular Movements: Extraocular movements  intact.     Conjunctiva/sclera: Conjunctivae normal.     Pupils: Pupils are equal, round, and reactive to light.     Comments: Glasses. Brown eyes.  Cardiovascular:     Rate and Rhythm: Normal rate and regular rhythm.     Heart sounds: Normal heart sounds. No murmur heard.   Pulmonary:     Effort: Pulmonary effort is normal. No respiratory distress.     Breath sounds: Normal breath sounds. No wheezing or rales.  Chest:     Chest wall: No tenderness.  Breasts:     Right: No supraclavicular adenopathy.     Left: No supraclavicular adenopathy.    Abdominal:     General: Bowel sounds are normal. There is no distension.     Palpations: Abdomen is soft. There is no hepatomegaly, splenomegaly or mass.     Tenderness: There is no abdominal tenderness. There is no guarding or rebound.  Musculoskeletal:        General: Tenderness (BLE) present. No swelling. Normal range of motion.     Cervical back: Normal range of motion and neck supple.     Right lower leg: No edema.     Left lower leg: No edema.  Lymphadenopathy:     Head:     Right side of head: No preauricular, posterior auricular or occipital adenopathy.     Left side of head: No preauricular, posterior auricular or occipital adenopathy.     Cervical: No cervical adenopathy.     Upper Body:     Right upper body: No supraclavicular adenopathy.     Left upper body: No supraclavicular adenopathy.  Skin:    General: Skin is warm and dry.     Coloration: Skin is not jaundiced or pale.     Findings: No erythema.  Neurological:     Mental Status: She is alert and oriented to person, place, and time. Mental status is at baseline.  Psychiatric:        Behavior: Behavior normal.        Thought Content: Thought content normal.        Judgment: Judgment normal.    Appointment on 08/31/2020  Component Date Value Ref Range Status  . WBC 08/31/2020 5.3  4.0 - 10.5 K/uL Final  . RBC 08/31/2020 3.77* 3.87 - 5.11 MIL/uL Final  .  Hemoglobin 08/31/2020 10.4* 12.0 - 15.0 g/dL Final  . HCT 08/31/2020 31.5* 36.0 - 46.0 % Final  . MCV 08/31/2020 83.6  80.0 - 100.0 fL Final  . MCH 08/31/2020 27.6  26.0 - 34.0 pg Final  . MCHC 08/31/2020 33.0  30.0 - 36.0 g/dL Final  . RDW 08/31/2020 12.4  11.5 - 15.5 % Final  . Platelets 08/31/2020 182  150 - 400 K/uL Final  . nRBC 08/31/2020 0.0  0.0 - 0.2 % Final  . Neutrophils Relative % 08/31/2020 60  % Final  . Neutro Abs 08/31/2020 3.2  1.7 - 7.7 K/uL Final  . Lymphocytes Relative 08/31/2020 26  % Final  . Lymphs Abs 08/31/2020 1.4  0.7 - 4.0 K/uL Final  . Monocytes Relative 08/31/2020 11  % Final  . Monocytes Absolute 08/31/2020 0.6  0.1 - 1.0 K/uL Final  . Eosinophils Relative 08/31/2020 2  % Final  . Eosinophils Absolute 08/31/2020 0.1  0.0 - 0.5 K/uL Final  . Basophils Relative 08/31/2020 1  % Final  . Basophils Absolute 08/31/2020 0.0  0.0 - 0.1 K/uL Final  . Immature Granulocytes 08/31/2020 0  % Final  . Abs Immature Granulocytes 08/31/2020 0.02  0.00 - 0.07 K/uL Final   Performed at [facility], [address], [place], [place] [address]  Assessment:  Glennie Bose is a 65 y.o. 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  Initial bone marrow revealed 80% plasma cells. Lambda free light chains were 1340. She had nephrotic range proteinuria. She initially underwent induction with RVD. Revlimid maintenance was discontinued on 01/21/2017 secondary to intolerance.   Bone marrowaspirate andbiopsyon 01/18/2021revealed anormocellularmarrow withbut increased lambda-restricted plasma cells (9% aspirate, 40% CD138 immunohistochemistry).Findingswereconsistent with recurrent plasma cell myeloma.Flow cytometry revealed no monoclonal B-cell or phenotypically aberrant T-cell population.  Cytogeneticswere 13, XX (normal). 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  M-spikehas been followed: 0 on 04/02/2016 -06/23/2019; 0.2 on 09/02/2017, 0.1 on 11/25/2019, 0.1 on 01/10/2020, 0.2 on 02/07/2020, 0.1 on 03/07/2020, 0 on 04/10/2020, and 06/19/2020.  Lambda light chainshave been followed: 22.2 (ratio 0.56) on 07/03/2017, 30.8 (ratio 0.78) on 09/02/2017, 36.9 (ratio 0.40) on 10/21/2017, 37.4 (ratio 0.41) on 12/16/2017, 70.7(ratio 0.31) on 02/17/2018, 64.2 (ratio 0.27) on 04/07/2018, 78.9 (ratio 0.18) on 05/26/2018, 128.8 (ratio 0.17) on 08/06/2018, 181.5 (ratio 0.13) on 10/08/2018, 130.9 (ratio 0.13) on 10/20/2018, 160.7 (ratio 0.10)on 12/09/2018, 236.6 (ratio 0.07) on 02/01/2019, 363.6 (ratio 0.04) on 03/22/2019, 404.8 (ratio 0.04) on 04/05/2019, 420.7 (ratio 0.03) on 05/24/2019, 573.4 (ratio 0.03) on 06/23/2019, 451.05(ratio 0.02) on02/19/2021, 47.2 (ratio 0.13) on 10/25/2019, 22.4 (ratio 0.21) on 11/25/2019, 16.5 (ratio 0.33) on 01/10/2020, 14.6 (ratio 0.34) on 02/07/2020, 13.1 (ratio 0.31) on 03/07/2020, 10.1 (ratio 0.38) on 04/10/2020, and 9.5 (ratio 0.21) on 06/19/2020.  24 hour UPEPon 06/03/2019 revealed kappa free light chains95.76,lambda free light chains1,260.71, andratio 0.08.24 hourUPEPon 02/22/2021revealed totalproteinof765m/24 hrs withlambdafree light chains 1,084.176mL andratioof0.10(1.03-31.76). M spike inurinewas46.1%(36163m4 hrs).  Bone surveyon 04/08/2016 and11/28/2018 revealed no definite lytic lesion seen in the visualized skeleton.Bone surveyon 11/19/2018 revealed no suspicious lucent lesionsand no acute bony abnormality.PET scanon 07/12/2019 revealed no focal metabolic activity to suggest active myeloma within the skeleton. There wereno lytic lesions identified on the CT portion of the examorsoft tissue plasmacytomas. There was no evidence of multiple myeloma.  Pretreatment RBC phenotypeon 09/23/2019  waspositivefor C, e, DUFFY B, KIDD B, M, S, and s antigen; negativefor c, E, KELL, DUFFY A, KIDD A, and N antigen.  Shereceived 6 cycles of daratumumab and hyaluronidase-fihj, Pomalyst, and Decadron (DPd)(09/27/2019 - 10/25/2019; 12/09/2019 - 03/13/2020). Cycle #1 was complicated by fever and neutropenia requiring admission.  Cycle #2 was complicated by pneumonia requiring admission.  Cycle #6 was complicated with an ER evaluation for an elevated lactic acid.  She is day 113 s/p [facility].  She underwent conditioning with melphalan 140 mg/m2.     She has severe aortic stenosis.  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 Cardiology is following for a possible TAVR in the future.  Echo on 07/05/2020 revealed moderate to severe aortic stenosis with an EF of 50-55%.  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 Zometa was discontinued in 01/2017. She has chronic nauseaon Phenergan.  She has B12 deficiency. B12 was 254 on 04/09/2017, 295 on 08/20/2018, and 391 on 10/08/2018. Shewasonoral B12.She received B12 monthly (last02/17/2022).Folate was12.1on 01/10/2020.  She has iron deficiency. Ferritin was 32 on 07/01/2019. She received Venoferon 07/15/2019 and 07/22/2019.  She has hypogammaglobulinemia.  IgG was 245 on 11/25/2019.  She received monthly IVIG (07/22/021 - 03/22/2020).  She received  IVIG 400 mg/kg on 01/20/2020, 200 mg/kg on 02/17/2020, and 300 mg/kg on 03/22/2020.  IVIG on 35/32/9924 was complicated by acute renal failure. IgG trough level was 418 on 02/17/2020.  She was admitted to [facility] 10/15/2019 - 10/20/2019 with fever and neutropenia. Cultures were negative. CXR was negative. She received broad spectrum antibiotics and daily Granix. She received IVF for acute renal insufficiency due to diarrhea and  dehydration. Creatine was 1.66 on admission and 1.12 on discharge.  She was admitted to [facility] from 03/01/2020 - 03/05/2020 with fever and neutropenia. CXR revealed no active cardiopulmonary disease. Chest CT with contrast revealed no acute intrathoracic pathology. There were findings which could be suggestive of prior granulomatous disease. She was treated with Cefepime and Vancomycin, then switched to ciprofloxacin on 03/03/2020.   The patient was admitted to [facility] from 05/08/2020 - 06/04/2020 for autologous bone marrow transplant.   She received conditioning with melphalan 140 mg/m2.  Bone marrow transplant was on 05/10/2020. The patient developed fevers daily from 05/19/2020 - 05/24/2020. Blood cultures were + for strept sanguis bacteremia.  She was treated cefepime, vancomycin, and solumedrol for possible engraftment syndrome. Cefepime was switched to ceftriaxone; she completed 2 weeks of ceftriaxone on 06/03/2020. She had chemotherapy induced diarrhea.  She experienced urinary retention.  She received the [place] 09/20/2019 and 10/09/2019. She received the Booster on 08/10/2020. She received the tetanus shot on 08/20/2020. She received the shingles vaccine recently.  Symptomatically, she is still tired and fatigued. She reports occasional dizziness. She has pain in her thighs that she feels "goes down to the bone."  Exam is stable.  Plan: 1.   Labs today: CBC with diff, CMP, Mg, SPEP, FLCA. 2. Stage III multiple myeloma  Symptomatically, she continues to improve s/p transplant.  Exam is stable..  Lambda free light chains have improved from451.05on02/19/2021. After cycle #1: 47.2 (ratio 0.13). After cycle #6:10.1 (ratio 0.38).   After transplant: 9.5 (ratio 0.21).  She is day 113 s/p stem cell transplant #2 (05/10/2020).  Infection prophylaxis   Bactrim DS MWF through at least day 60 s/p  transplant.    Per protocol, anticipate discontinuation.   Valtrex through 1 year s/p transplant.  Vaccination   Patient receives vaccines in the transplant center.  Post transplant RN coordinator [phone]).  Review plan for maintenance Velcade every 2 weeks approximately 60-100 days s/p transplant.   Patient consents to treatment.  Labs reviewed.  Week #1 Velcade today.  Discuss symptom management.  She has antiemetics (Phenergan) at home to use on a prn bases.  Interventions are adequate.    3.   Normocytic anemia  Hematocrit 31.5.  Hemoglobin 10.4.  MCV 83.8 today.  Continue to monitor. 4. Grade II peripheral neuropathy  Neuropathy is slightly worse s/p 2nd transplant.  Review potential issues of worsening neuropathy with initiation of Velcade maintenance. 5.   Hypogammaglobulinemia             She has had recurrent infections.               She received 4 doses of IVIG (last 04/24/2020). Goal IgGtrough level is 500.   Follow-up with transplant team regarding possible monthly IVIG.   Patient would require IVIG if recurrent infections become an issue. 6.   Chronic hypomagnesemia Magnesium1.4 today. Patient unable to receive magnesium in clinic.  Magnesium 4 gm IV today.  Continue magnesium checks twice weekly (Mondays and Thursdays). 7.   Chronic nausea  Continue Phenergan oral and IV prn when  she is in clinic. 8.   Aortic stenosis  Echo on 05/21/2020 revealed severe aortic stenosis (tricuspid valve but 2 leaflets fused) and EF 45-50%.  Echo on 07/05/2020 revealed moderate to severe aortic stenosis with an EF of 50-55%.    She has a follow-up echo in 6 months. 9.   B12 deficiency  Patient receives B12 monthly (last 08/17/2020).  B12 today and monthly x 6. 10.   Phenergan IV today. 11.   Magnesium 4 gm IV. 12.   Week #1 Velcade. 13.   RTC on Monday and Thursdays for labs (Mg) and +/- IV Mg and Phenergan. 14.   RTC in 2 weeks for MD  assessment, labs (CBC with diff, CMP, Mg), week #2 Velcade and IV Mg.  I discussed the assessment and treatment plan with the patient.  The patient was provided an opportunity to ask questions and all were answered.  The patient agreed with the plan and demonstrated an understanding of the instructions.  The patient was advised to call back if the symptoms worsen or if the condition fails to improve as anticipated.    Lequita Asal, MD, PhD    08/31/2020, 8:57 AM   I, Mirian Mo Tufford, am acting as Education administrator for [employer]. Mike Gip, MD, PhD.  I, Suanne Minahan C. Mike Gip, MD, have reviewed the above documentation for accuracy and completeness, and I agree with the above.

## 2020-08-31 ENCOUNTER — Inpatient Hospital Stay: Payer: Medicare HMO

## 2020-08-31 ENCOUNTER — Encounter: Payer: Self-pay | Admitting: Hematology and Oncology

## 2020-08-31 ENCOUNTER — Other Ambulatory Visit: Payer: Self-pay

## 2020-08-31 ENCOUNTER — Inpatient Hospital Stay: Payer: Medicare HMO | Attending: Hematology and Oncology

## 2020-08-31 ENCOUNTER — Inpatient Hospital Stay: Payer: Medicare HMO | Admitting: Hematology and Oncology

## 2020-08-31 VITALS — BP 122/78 | HR 76 | Temp 96.0°F | Resp 16 | Wt 153.1 lb

## 2020-08-31 DIAGNOSIS — N183 Chronic kidney disease, stage 3 unspecified: Secondary | ICD-10-CM | POA: Insufficient documentation

## 2020-08-31 DIAGNOSIS — Z9049 Acquired absence of other specified parts of digestive tract: Secondary | ICD-10-CM | POA: Insufficient documentation

## 2020-08-31 DIAGNOSIS — I129 Hypertensive chronic kidney disease with stage 1 through stage 4 chronic kidney disease, or unspecified chronic kidney disease: Secondary | ICD-10-CM | POA: Diagnosis not present

## 2020-08-31 DIAGNOSIS — R197 Diarrhea, unspecified: Secondary | ICD-10-CM | POA: Diagnosis not present

## 2020-08-31 DIAGNOSIS — T451X5A Adverse effect of antineoplastic and immunosuppressive drugs, initial encounter: Secondary | ICD-10-CM | POA: Diagnosis not present

## 2020-08-31 DIAGNOSIS — Z5112 Encounter for antineoplastic immunotherapy: Secondary | ICD-10-CM | POA: Insufficient documentation

## 2020-08-31 DIAGNOSIS — M549 Dorsalgia, unspecified: Secondary | ICD-10-CM | POA: Insufficient documentation

## 2020-08-31 DIAGNOSIS — R7881 Bacteremia: Secondary | ICD-10-CM | POA: Insufficient documentation

## 2020-08-31 DIAGNOSIS — Z888 Allergy status to other drugs, medicaments and biological substances status: Secondary | ICD-10-CM | POA: Insufficient documentation

## 2020-08-31 DIAGNOSIS — G629 Polyneuropathy, unspecified: Secondary | ICD-10-CM | POA: Insufficient documentation

## 2020-08-31 DIAGNOSIS — D649 Anemia, unspecified: Secondary | ICD-10-CM

## 2020-08-31 DIAGNOSIS — E538 Deficiency of other specified B group vitamins: Secondary | ICD-10-CM | POA: Diagnosis not present

## 2020-08-31 DIAGNOSIS — Z808 Family history of malignant neoplasm of other organs or systems: Secondary | ICD-10-CM | POA: Insufficient documentation

## 2020-08-31 DIAGNOSIS — R110 Nausea: Secondary | ICD-10-CM | POA: Diagnosis not present

## 2020-08-31 DIAGNOSIS — N179 Acute kidney failure, unspecified: Secondary | ICD-10-CM | POA: Diagnosis not present

## 2020-08-31 DIAGNOSIS — Z79899 Other long term (current) drug therapy: Secondary | ICD-10-CM | POA: Insufficient documentation

## 2020-08-31 DIAGNOSIS — D801 Nonfamilial hypogammaglobulinemia: Secondary | ICD-10-CM | POA: Insufficient documentation

## 2020-08-31 DIAGNOSIS — Z87891 Personal history of nicotine dependence: Secondary | ICD-10-CM | POA: Insufficient documentation

## 2020-08-31 DIAGNOSIS — C9000 Multiple myeloma not having achieved remission: Secondary | ICD-10-CM

## 2020-08-31 DIAGNOSIS — E1122 Type 2 diabetes mellitus with diabetic chronic kidney disease: Secondary | ICD-10-CM | POA: Diagnosis not present

## 2020-08-31 DIAGNOSIS — E860 Dehydration: Secondary | ICD-10-CM | POA: Diagnosis not present

## 2020-08-31 DIAGNOSIS — Z886 Allergy status to analgesic agent status: Secondary | ICD-10-CM | POA: Insufficient documentation

## 2020-08-31 DIAGNOSIS — Z800 Family history of malignant neoplasm of digestive organs: Secondary | ICD-10-CM | POA: Insufficient documentation

## 2020-08-31 DIAGNOSIS — Z885 Allergy status to narcotic agent status: Secondary | ICD-10-CM | POA: Insufficient documentation

## 2020-08-31 DIAGNOSIS — Z8249 Family history of ischemic heart disease and other diseases of the circulatory system: Secondary | ICD-10-CM | POA: Insufficient documentation

## 2020-08-31 DIAGNOSIS — Z9484 Stem cells transplant status: Secondary | ICD-10-CM | POA: Diagnosis not present

## 2020-08-31 DIAGNOSIS — Z8379 Family history of other diseases of the digestive system: Secondary | ICD-10-CM | POA: Insufficient documentation

## 2020-08-31 DIAGNOSIS — Z841 Family history of disorders of kidney and ureter: Secondary | ICD-10-CM | POA: Insufficient documentation

## 2020-08-31 DIAGNOSIS — Z833 Family history of diabetes mellitus: Secondary | ICD-10-CM | POA: Insufficient documentation

## 2020-08-31 DIAGNOSIS — Z803 Family history of malignant neoplasm of breast: Secondary | ICD-10-CM | POA: Insufficient documentation

## 2020-08-31 DIAGNOSIS — Z822 Family history of deafness and hearing loss: Secondary | ICD-10-CM | POA: Insufficient documentation

## 2020-08-31 DIAGNOSIS — C9001 Multiple myeloma in remission: Secondary | ICD-10-CM

## 2020-08-31 DIAGNOSIS — Z9481 Bone marrow transplant status: Secondary | ICD-10-CM | POA: Insufficient documentation

## 2020-08-31 DIAGNOSIS — Z5111 Encounter for antineoplastic chemotherapy: Secondary | ICD-10-CM | POA: Diagnosis not present

## 2020-08-31 DIAGNOSIS — Z801 Family history of malignant neoplasm of trachea, bronchus and lung: Secondary | ICD-10-CM | POA: Insufficient documentation

## 2020-08-31 LAB — COMPREHENSIVE METABOLIC PANEL
ALT: 24 U/L (ref 0–44)
AST: 30 U/L (ref 15–41)
Albumin: 4.3 g/dL (ref 3.5–5.0)
Alkaline Phosphatase: 89 U/L (ref 38–126)
Anion gap: 15 (ref 5–15)
BUN: 27 mg/dL — ABNORMAL HIGH (ref 8–23)
CO2: 22 mmol/L (ref 22–32)
Calcium: 9.4 mg/dL (ref 8.9–10.3)
Chloride: 99 mmol/L (ref 98–111)
Creatinine, Ser: 1.11 mg/dL — ABNORMAL HIGH (ref 0.44–1.00)
GFR, Estimated: 56 mL/min — ABNORMAL LOW (ref >60)
Glucose, Bld: 151 mg/dL — ABNORMAL HIGH (ref 70–99)
Potassium: 4.2 mmol/L (ref 3.5–5.1)
Sodium: 136 mmol/L (ref 135–145)
Total Bilirubin: 0.5 mg/dL (ref 0.3–1.2)
Total Protein: 6.9 g/dL (ref 6.5–8.1)

## 2020-08-31 LAB — CBC WITH DIFFERENTIAL/PLATELET
Abs Immature Granulocytes: 0.02 10*3/uL (ref 0.00–0.07)
Basophils Absolute: 0 10*3/uL (ref 0.0–0.1)
Basophils Relative: 1 %
Eosinophils Absolute: 0.1 10*3/uL (ref 0.0–0.5)
Eosinophils Relative: 2 %
HCT: 31.5 % — ABNORMAL LOW (ref 36.0–46.0)
Hemoglobin: 10.4 g/dL — ABNORMAL LOW (ref 12.0–15.0)
Immature Granulocytes: 0 %
Lymphocytes Relative: 26 %
Lymphs Abs: 1.4 10*3/uL (ref 0.7–4.0)
MCH: 27.6 pg (ref 26.0–34.0)
MCHC: 33 g/dL (ref 30.0–36.0)
MCV: 83.6 fL (ref 80.0–100.0)
Monocytes Absolute: 0.6 10*3/uL (ref 0.1–1.0)
Monocytes Relative: 11 %
Neutro Abs: 3.2 10*3/uL (ref 1.7–7.7)
Neutrophils Relative %: 60 %
Platelets: 182 10*3/uL (ref 150–400)
RBC: 3.77 MIL/uL — ABNORMAL LOW (ref 3.87–5.11)
RDW: 12.4 % (ref 11.5–15.5)
WBC: 5.3 10*3/uL (ref 4.0–10.5)
nRBC: 0 % (ref 0.0–0.2)

## 2020-08-31 LAB — MAGNESIUM: Magnesium: 1.4 mg/dL — ABNORMAL LOW (ref 1.7–2.4)

## 2020-08-31 MED ORDER — BORTEZOMIB CHEMO IV INJECTION 3.5 MG (1MG/ML-GENERIC)
2.0000 mg | Freq: Once | INTRAVENOUS | Status: AC
Start: 2020-08-31 — End: 2020-08-31
  Administered 2020-08-31: 2 mg via INTRAVENOUS
  Filled 2020-08-31: qty 2

## 2020-08-31 MED ORDER — SODIUM CHLORIDE 0.9 % IV SOLN
Freq: Once | INTRAVENOUS | Status: AC
  Filled 2020-08-31: qty 250

## 2020-08-31 MED ORDER — MAGNESIUM SULFATE 4 GM/100ML IV SOLN
4.0000 g | Freq: Once | INTRAVENOUS | Status: AC
  Administered 2020-08-31: 4 g via INTRAVENOUS
  Filled 2020-08-31: qty 100

## 2020-08-31 MED ORDER — HEPARIN SOD (PORK) LOCK FLUSH 100 UNIT/ML IV SOLN
500.0000 [IU] | Freq: Once | INTRAVENOUS | Status: AC | PRN
  Administered 2020-08-31: 500 [IU]
  Filled 2020-08-31: qty 5

## 2020-08-31 MED ORDER — PROMETHAZINE HCL 25 MG/ML IJ SOLN
25.0000 mg | Freq: Once | INTRAMUSCULAR | Status: AC
  Administered 2020-08-31: 25 mg via INTRAVENOUS
  Filled 2020-08-31: qty 1

## 2020-08-31 MED ORDER — SODIUM CHLORIDE 0.9% FLUSH
10.0000 mL | Freq: Once | INTRAVENOUS | Status: AC | PRN
  Administered 2020-08-31: 10 mL
  Filled 2020-08-31: qty 10

## 2020-08-31 NOTE — Progress Notes (Signed)
Patient received prescribed treatment in clinic. Tolerated well. Patient stable at discharge. 

## 2020-09-01 LAB — KAPPA/LAMBDA LIGHT CHAINS
Kappa free light chain: 3.8 mg/L (ref 3.3–19.4)
Kappa, lambda light chain ratio: 0.53 (ref 0.26–1.65)
Lambda free light chains: 7.2 mg/L (ref 5.7–26.3)

## 2020-09-04 ENCOUNTER — Inpatient Hospital Stay: Payer: Medicare HMO

## 2020-09-04 ENCOUNTER — Other Ambulatory Visit: Payer: Self-pay

## 2020-09-04 ENCOUNTER — Ambulatory Visit: Payer: Medicare PPO

## 2020-09-04 DIAGNOSIS — C9000 Multiple myeloma not having achieved remission: Secondary | ICD-10-CM | POA: Diagnosis not present

## 2020-09-04 DIAGNOSIS — N183 Chronic kidney disease, stage 3 unspecified: Secondary | ICD-10-CM | POA: Diagnosis not present

## 2020-09-04 DIAGNOSIS — M549 Dorsalgia, unspecified: Secondary | ICD-10-CM | POA: Diagnosis not present

## 2020-09-04 DIAGNOSIS — Z5112 Encounter for antineoplastic immunotherapy: Secondary | ICD-10-CM | POA: Diagnosis not present

## 2020-09-04 DIAGNOSIS — R197 Diarrhea, unspecified: Secondary | ICD-10-CM | POA: Diagnosis not present

## 2020-09-04 DIAGNOSIS — G629 Polyneuropathy, unspecified: Secondary | ICD-10-CM | POA: Diagnosis not present

## 2020-09-04 DIAGNOSIS — C9001 Multiple myeloma in remission: Secondary | ICD-10-CM

## 2020-09-04 DIAGNOSIS — I129 Hypertensive chronic kidney disease with stage 1 through stage 4 chronic kidney disease, or unspecified chronic kidney disease: Secondary | ICD-10-CM | POA: Diagnosis not present

## 2020-09-04 DIAGNOSIS — Z9484 Stem cells transplant status: Secondary | ICD-10-CM | POA: Diagnosis not present

## 2020-09-04 DIAGNOSIS — R110 Nausea: Secondary | ICD-10-CM | POA: Diagnosis not present

## 2020-09-04 LAB — MAGNESIUM: Magnesium: 1.4 mg/dL — ABNORMAL LOW (ref 1.7–2.4)

## 2020-09-04 LAB — PROTEIN ELECTROPHORESIS, SERUM
A/G Ratio: 1.8 — ABNORMAL HIGH (ref 0.7–1.7)
Albumin ELP: 4.2 g/dL (ref 2.9–4.4)
Alpha-1-Globulin: 0.2 g/dL (ref 0.0–0.4)
Alpha-2-Globulin: 1 g/dL (ref 0.4–1.0)
Beta Globulin: 1 g/dL (ref 0.7–1.3)
Gamma Globulin: 0.2 g/dL — ABNORMAL LOW (ref 0.4–1.8)
Globulin, Total: 2.4 g/dL (ref 2.2–3.9)
Total Protein ELP: 6.6 g/dL (ref 6.0–8.5)

## 2020-09-04 MED ORDER — MAGNESIUM SULFATE 4 GM/100ML IV SOLN
4.0000 g | Freq: Once | INTRAVENOUS | Status: AC
  Administered 2020-09-04: 4 g via INTRAVENOUS
  Filled 2020-09-04: qty 100

## 2020-09-04 MED ORDER — HEPARIN SOD (PORK) LOCK FLUSH 100 UNIT/ML IV SOLN
500.0000 [IU] | Freq: Once | INTRAVENOUS | Status: AC | PRN
  Administered 2020-09-04: 500 [IU]
  Filled 2020-09-04: qty 5

## 2020-09-04 MED ORDER — SODIUM CHLORIDE 0.9% FLUSH
10.0000 mL | Freq: Once | INTRAVENOUS | Status: AC | PRN
  Administered 2020-09-04: 10 mL
  Filled 2020-09-04: qty 10

## 2020-09-04 MED ORDER — PROMETHAZINE HCL 25 MG/ML IJ SOLN
25.0000 mg | Freq: Once | INTRAMUSCULAR | Status: AC
  Administered 2020-09-04: 25 mg via INTRAVENOUS
  Filled 2020-09-04: qty 1

## 2020-09-04 MED ORDER — SODIUM CHLORIDE 0.9 % IV SOLN
Freq: Once | INTRAVENOUS | Status: AC
  Filled 2020-09-04: qty 250

## 2020-09-04 NOTE — Progress Notes (Signed)
Patient received prescribed treatment in clinic. Tolerated well. Patient stable at discharge. 

## 2020-09-07 ENCOUNTER — Other Ambulatory Visit: Payer: Self-pay

## 2020-09-07 ENCOUNTER — Inpatient Hospital Stay: Payer: Medicare HMO

## 2020-09-07 DIAGNOSIS — C9000 Multiple myeloma not having achieved remission: Secondary | ICD-10-CM

## 2020-09-07 DIAGNOSIS — Z9484 Stem cells transplant status: Secondary | ICD-10-CM | POA: Diagnosis not present

## 2020-09-07 DIAGNOSIS — R110 Nausea: Secondary | ICD-10-CM | POA: Diagnosis not present

## 2020-09-07 DIAGNOSIS — Z5112 Encounter for antineoplastic immunotherapy: Secondary | ICD-10-CM | POA: Diagnosis not present

## 2020-09-07 DIAGNOSIS — M549 Dorsalgia, unspecified: Secondary | ICD-10-CM | POA: Diagnosis not present

## 2020-09-07 DIAGNOSIS — C9001 Multiple myeloma in remission: Secondary | ICD-10-CM

## 2020-09-07 DIAGNOSIS — R197 Diarrhea, unspecified: Secondary | ICD-10-CM | POA: Diagnosis not present

## 2020-09-07 DIAGNOSIS — I129 Hypertensive chronic kidney disease with stage 1 through stage 4 chronic kidney disease, or unspecified chronic kidney disease: Secondary | ICD-10-CM | POA: Diagnosis not present

## 2020-09-07 DIAGNOSIS — G629 Polyneuropathy, unspecified: Secondary | ICD-10-CM | POA: Diagnosis not present

## 2020-09-07 DIAGNOSIS — N183 Chronic kidney disease, stage 3 unspecified: Secondary | ICD-10-CM | POA: Diagnosis not present

## 2020-09-07 LAB — MAGNESIUM: Magnesium: 1.5 mg/dL — ABNORMAL LOW (ref 1.7–2.4)

## 2020-09-07 MED ORDER — MAGNESIUM SULFATE 2 GM/50ML IV SOLN
2.0000 g | Freq: Once | INTRAVENOUS | Status: AC
  Administered 2020-09-07: 2 g via INTRAVENOUS
  Filled 2020-09-07: qty 50

## 2020-09-07 MED ORDER — SODIUM CHLORIDE 0.9% FLUSH
10.0000 mL | Freq: Once | INTRAVENOUS | Status: AC | PRN
  Administered 2020-09-07: 10 mL
  Filled 2020-09-07: qty 10

## 2020-09-07 MED ORDER — PROMETHAZINE HCL 25 MG/ML IJ SOLN
25.0000 mg | Freq: Once | INTRAMUSCULAR | Status: AC
  Administered 2020-09-07: 25 mg via INTRAVENOUS
  Filled 2020-09-07: qty 1

## 2020-09-07 MED ORDER — HEPARIN SOD (PORK) LOCK FLUSH 100 UNIT/ML IV SOLN
500.0000 [IU] | Freq: Once | INTRAVENOUS | Status: AC | PRN
  Administered 2020-09-07: 500 [IU]
  Filled 2020-09-07: qty 5

## 2020-09-07 MED ORDER — SODIUM CHLORIDE 0.9 % IV SOLN
Freq: Once | INTRAVENOUS | Status: AC
  Filled 2020-09-07: qty 250

## 2020-09-07 NOTE — Progress Notes (Signed)
Patient received prescribed treatment in clinic. Tolerated well. Patient stable at discharge. 

## 2020-09-08 DIAGNOSIS — Z886 Allergy status to analgesic agent status: Secondary | ICD-10-CM | POA: Diagnosis not present

## 2020-09-08 DIAGNOSIS — R5383 Other fatigue: Secondary | ICD-10-CM | POA: Diagnosis not present

## 2020-09-08 DIAGNOSIS — Z9484 Stem cells transplant status: Secondary | ICD-10-CM | POA: Diagnosis not present

## 2020-09-08 DIAGNOSIS — C9000 Multiple myeloma not having achieved remission: Secondary | ICD-10-CM | POA: Diagnosis not present

## 2020-09-08 DIAGNOSIS — G629 Polyneuropathy, unspecified: Secondary | ICD-10-CM | POA: Diagnosis not present

## 2020-09-08 DIAGNOSIS — N179 Acute kidney failure, unspecified: Secondary | ICD-10-CM | POA: Diagnosis not present

## 2020-09-08 DIAGNOSIS — R635 Abnormal weight gain: Secondary | ICD-10-CM | POA: Diagnosis not present

## 2020-09-08 DIAGNOSIS — D801 Nonfamilial hypogammaglobulinemia: Secondary | ICD-10-CM | POA: Diagnosis not present

## 2020-09-08 DIAGNOSIS — Z87891 Personal history of nicotine dependence: Secondary | ICD-10-CM | POA: Diagnosis not present

## 2020-09-11 ENCOUNTER — Inpatient Hospital Stay: Payer: Medicare HMO

## 2020-09-11 ENCOUNTER — Other Ambulatory Visit: Payer: Self-pay

## 2020-09-11 VITALS — BP 124/71 | HR 78 | Temp 96.9°F | Resp 16

## 2020-09-11 DIAGNOSIS — C9000 Multiple myeloma not having achieved remission: Secondary | ICD-10-CM | POA: Diagnosis not present

## 2020-09-11 DIAGNOSIS — I129 Hypertensive chronic kidney disease with stage 1 through stage 4 chronic kidney disease, or unspecified chronic kidney disease: Secondary | ICD-10-CM | POA: Diagnosis not present

## 2020-09-11 DIAGNOSIS — R197 Diarrhea, unspecified: Secondary | ICD-10-CM | POA: Diagnosis not present

## 2020-09-11 DIAGNOSIS — Z9484 Stem cells transplant status: Secondary | ICD-10-CM | POA: Diagnosis not present

## 2020-09-11 DIAGNOSIS — N183 Chronic kidney disease, stage 3 unspecified: Secondary | ICD-10-CM | POA: Diagnosis not present

## 2020-09-11 DIAGNOSIS — Z5112 Encounter for antineoplastic immunotherapy: Secondary | ICD-10-CM | POA: Diagnosis not present

## 2020-09-11 DIAGNOSIS — G629 Polyneuropathy, unspecified: Secondary | ICD-10-CM | POA: Diagnosis not present

## 2020-09-11 DIAGNOSIS — M549 Dorsalgia, unspecified: Secondary | ICD-10-CM | POA: Diagnosis not present

## 2020-09-11 DIAGNOSIS — R110 Nausea: Secondary | ICD-10-CM | POA: Diagnosis not present

## 2020-09-11 LAB — MAGNESIUM: Magnesium: 1.5 mg/dL — ABNORMAL LOW (ref 1.7–2.4)

## 2020-09-11 MED ORDER — SODIUM CHLORIDE 0.9 % IV SOLN
Freq: Once | INTRAVENOUS | Status: AC
  Filled 2020-09-11: qty 250

## 2020-09-11 MED ORDER — PROMETHAZINE HCL 25 MG/ML IJ SOLN
25.0000 mg | Freq: Once | INTRAMUSCULAR | Status: DC
  Filled 2020-09-11: qty 1

## 2020-09-11 MED ORDER — SODIUM CHLORIDE 0.9% FLUSH
10.0000 mL | Freq: Once | INTRAVENOUS | Status: AC | PRN
  Administered 2020-09-11: 10 mL
  Filled 2020-09-11: qty 10

## 2020-09-11 MED ORDER — MAGNESIUM SULFATE 2 GM/50ML IV SOLN
2.0000 g | Freq: Once | INTRAVENOUS | Status: AC
Start: 2020-09-11 — End: 2020-09-11
  Administered 2020-09-11: 2 g via INTRAVENOUS
  Filled 2020-09-11: qty 50

## 2020-09-11 MED ORDER — SODIUM CHLORIDE 0.9 % IV SOLN
25.0000 mg | Freq: Four times a day (QID) | INTRAVENOUS | Status: DC | PRN
  Administered 2020-09-11: 25 mg via INTRAVENOUS
  Filled 2020-09-11: qty 1

## 2020-09-11 MED ORDER — HEPARIN SOD (PORK) LOCK FLUSH 100 UNIT/ML IV SOLN
500.0000 [IU] | Freq: Once | INTRAVENOUS | Status: AC | PRN
  Administered 2020-09-11: 500 [IU]
  Filled 2020-09-11: qty 5

## 2020-09-11 NOTE — Progress Notes (Signed)
Patient received prescribed treatment in clinic. Tolerated well. Patient stable at discharge. 

## 2020-09-12 ENCOUNTER — Other Ambulatory Visit: Payer: Self-pay | Admitting: Internal Medicine

## 2020-09-12 DIAGNOSIS — K219 Gastro-esophageal reflux disease without esophagitis: Secondary | ICD-10-CM

## 2020-09-13 NOTE — Progress Notes (Signed)
[facility]  [address] [place], [place] [address] Phone: [phone]  Fax: [phone]   Clinic Day:  09/14/2020   Referring physician: Glean Hess, MD  Chief Complaint: Sarah [name] is a 65 y.o. 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  HPI: The patient was last seen in the medical oncology clinic on 08/31/2020.  At that time, she felt fatigued.  She noted occasional dizziness and pain in her thighs.  Neuropathy was stable. Hematocrit was 31.5, hemoglobin 10.4, MCV 83.6, platelets 182,000, WBC 5,300.  Creatinine was 1.11 (CrCl 56 ml/min). M spike was 0. Kappa free light chains were 3.8, lambda free light chains 7.2, and ratio 0.53. Magnesium was 1.4. She received 4 gm IV magnesium. She received week #1 Velcade.  The patient saw Dr. Fatima Sanger on 09/08/2020.  She tolerated Velcade well and denied any neuropathy. Her appetite was great and her energy level was fantastic. She was ready to begin going to church again, which she has not done for 2+ years. 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 There was no indication for replacement IVIG because she had no evidence of infection. Planned for follow-ups up every 3 months.  Magnesium was 1.4 on 09/04/2020, 1.5 on 09/07/2020, and 1.5 on 09/11/2020.  She received 4 gm IV magnesium on 09/04/2020 and 2 gm on 09/07/2020 and 09/11/2020.  During the interim, she has been "a little tired." She has not been sleeping well but is not sure why. Her neuropathy, dizziness, and back pain are stable. Her nausea is stable but her appetite is good.  Past Medical History:  Diagnosis Date  . Abnormal stress test 02/14/2016   Overview:  Added automatically from request for surgery [id number]  . Anemia   . Anxiety   . Arthritis   . Bicuspid aortic valve   . CHF (congestive  heart failure) ([place])   . CKD (chronic kidney disease) stage 3, GFR 30-59 ml/min (HCC)   . Depression   . Diabetes mellitus ([place])   . Dizziness   . Fatty liver   . Frequent falls   . GERD (gastroesophageal reflux disease)   . Gout   . Heart murmur   . History of blood transfusion   . History of bone marrow transplant ([place])   . History of uterine fibroid   . Hx of cardiac catheterization 06/05/2016   Overview:  Normal coronaries 2017  . Hypertension   . Hypomagnesemia   . Multiple myeloma ([place])   . Personal history of chemotherapy   . Renal cyst     Past Surgical History:  Procedure Laterality Date  . ABDOMINAL HYSTERECTOMY    . 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 CARDIAC ELECTROPHYSIOLOGY MAPPING AND ABLATION    . CARPAL TUNNEL RELEASE Bilateral   . CHOLECYSTECTOMY  2008  . COLONOSCOPY WITH PROPOFOL N/A 05/07/2017   Procedure: COLONOSCOPY WITH PROPOFOL;  Surgeon: [name], MD;  Location: [facility] ENDOSCOPY;  Service: Gastroenterology;  Laterality: N/A;  . ESOPHAGOGASTRODUODENOSCOPY (EGD) WITH PROPOFOL N/A 05/07/2017   Procedure: ESOPHAGOGASTRODUODENOSCOPY (EGD) WITH PROPOFOL;  Surgeon: [name], MD;  Location: [facility] ENDOSCOPY;  Service: Gastroenterology;  Laterality: N/A;  . FOOT SURGERY Bilateral   . INCONTINENCE SURGERY  2009  . INTERSTIM IMPLANT PLACEMENT    . other     over active bladder  . OTHER SURGICAL HISTORY  bladder stimulator   . PARTIAL HYSTERECTOMY  03/1996   fibroids  . PORTA CATH INSERTION N/A 03/10/2019   Procedure: PORTA CATH INSERTION;  Surgeon: Algernon Huxley, MD;  Location: [facility] LAB;  Service: Cardiovascular;  Laterality: N/A;  . TONSILLECTOMY  2007    Family History  Problem Relation Age of Onset  . Colon cancer Father   . Renal Disease Father   . Diabetes Mellitus II Father   . Melanoma Paternal Grandmother   . Breast cancer Maternal Aunt 21  . Anemia Mother   . Heart disease Mother   . Heart failure Mother   . Renal Disease  Mother   . Congestive Heart Failure Mother   . Heart disease Maternal Uncle   . Throat cancer Maternal Uncle   . Lung cancer Maternal Uncle   . Liver disease Maternal Uncle   . Heart failure Maternal Uncle   . Hearing loss Son 6       Suicide     Social History:  reports that she quit smoking about 29 years ago. Her smoking use included cigarettes. She has a 20.00 pack-year smoking history. She has never used smokeless tobacco. She reports current alcohol use. She reports that she does not use drugs. Patient has not had ETOH in several months. She is on disability. She notes exposure to perchloroethylene [facility]).She lives much of her adult life in [place]. She was married with 2 sons. Her husband passed away. Her 2 sons took their own lives (age 65 and 45). She worked in [employer] in [employer]. She went back to school and earned an [employer] and works for [employer] for several years.She [place] sistersin [place]. She has two dogs, Harley and Kirtland. The patient is alone today.   Allergies:  Allergies  Allergen Reactions  . Oxycodone-Acetaminophen Anaphylaxis    Swelling and rash  . Celebrex [Celecoxib] Diarrhea  . Codeine   . Plerixafor     In 2016 during ASCT collection patient developed fever to 103.93F and required hospitalization  . Benadryl [Diphenhydramine] Palpitations  . Morphine Itching and Rash  . Ondansetron Diarrhea  . Tylenol [Acetaminophen] Itching and Rash    Current Medications: Current Outpatient Medications  Medication Sig Dispense Refill  . bisoprolol (ZEBETA) 5 MG tablet Take 5 mg by mouth daily.    . fexofenadine (ALLEGRA) 180 MG tablet TAKE 1 TABLET(180 MG) BY MOUTH DAILY 30 tablet 5  . FLUoxetine (PROZAC) 40 MG capsule TAKE 1 CAPSULE EVERY DAY 90 capsule 1  . fluticasone (FLONASE) 50 MCG/ACT nasal spray Place 2 sprays into both nostrils daily. 16 g 2  . glucose blood test strip     . lidocaine (LIDODERM) 5 % 1 patch  daily.    . metFORMIN (GLUCOPHAGE-XR) 500 MG 24 hr tablet TAKE 1 TABLET EVERY DAY WITH BREAKFAST 90 tablet 1  . methocarbamol (ROBAXIN) 500 MG tablet TAKE 1 TABLET FOUR TIMES DAILY. 120 tablet 0  . Multiple Vitamin (MULTIVITAMIN) tablet Take 1 tablet by mouth daily.    . Multiple Vitamins-Minerals (HAIR/SKIN/NAILS) TABS Take 1 tablet by mouth daily.    Marland Kitchen omeprazole (PRILOSEC) 40 MG capsule TAKE 1 CAPSULE EVERY DAY 90 capsule 0  . promethazine (PHENERGAN) 25 MG tablet Take 1 tablet (25 mg total) by mouth every 6 (six) hours as needed for nausea or vomiting. 90 tablet 0  . traZODone (DESYREL) 100 MG tablet Take 1 tablet (100 mg total) by mouth at bedtime. 90 tablet 1  .  acetaminophen (TYLENOL) 500 MG tablet Take 500 mg by mouth every 6 (six) hours as needed.  (Patient not taking: No sig reported)    . acyclovir (ZOVIRAX) 200 MG capsule Take by mouth.    Marland Kitchen allopurinol (ZYLOPRIM) 100 MG tablet TAKE 1 TABLET(100 MG) BY MOUTH DAILY as needed (Patient not taking: Reported on 09/14/2020) 90 tablet 1  . diclofenac sodium (VOLTAREN) 1 % GEL Apply 2 g topically 4 (four) times daily. (Patient not taking: No sig reported) 100 g 1  . diphenhydrAMINE (BENADRYL) 50 MG capsule Take 50 mg by mouth as needed.  (Patient not taking: No sig reported)    . diphenoxylate-atropine (LOMOTIL) 2.5-0.025 MG tablet Take 2 tablets by mouth daily as needed for diarrhea or loose stools. (Patient not taking: Reported on 09/14/2020) 45 tablet 2  . lactulose (CHRONULAC) 10 GM/15ML solution Take 20 g by mouth daily as needed.  (Patient not taking: No sig reported)    . loperamide (IMODIUM) 2 MG capsule Take by mouth. (Patient not taking: Reported on 09/14/2020)     Current Facility-Administered Medications  Medication Dose Route Frequency Provider Last Rate Last Admin  . bortezomib IV chemo injection 2 mg  2 mg Intravenous Once Gery Sabedra C, MD      . magnesium sulfate IVPB 4 g 100 mL  4 g Intravenous Once Orin Eberwein C,  MD      . promethazine (PHENERGAN) 25 mg in sodium chloride 0.9 % 50 mL IVPB  25 mg Intravenous Once Lequita Asal, MD 200 mL/hr at 09/14/20 0934 25 mg at 09/14/20 0934   Facility-Administered Medications Ordered in Other Visits  Medication Dose Route Frequency Provider Last Rate Last Admin  . 0.9 %  sodium chloride infusion   Intravenous Continuous Lequita Asal, MD   Stopped at 01/20/20 1232  . 0.9 %  sodium chloride infusion   Intravenous Continuous Lequita Asal, MD 10 mL/hr at 09/14/20 0930 New Bag at 09/14/20 0930  . 0.9 %  sodium chloride infusion   Intravenous Once Aleiya Rye C, MD      . cyanocobalamin ((VITAMIN B-12)) injection 1,000 mcg  1,000 mcg Intramuscular Once Shykeem Resurreccion C, MD      . heparin lock flush 100 unit/mL  500 Units Intravenous Once Faythe Casa E, NP      . heparin lock flush 100 unit/mL  500 Units Intravenous Once Jakson Delpilar C, MD      . magnesium sulfate IVPB 4 g 100 mL  4 g Intravenous Once Arnulfo Batson C, MD      . sodium chloride flush (NS) 0.9 % injection 10 mL  10 mL Intravenous PRN Lequita Asal, MD   10 mL at 02/07/20 0815  . sodium chloride flush (NS) 0.9 % injection 10 mL  10 mL Intracatheter PRN Cammie Sickle, MD   10 mL at 02/10/20 0815  . sodium chloride flush (NS) 0.9 % injection 10 mL  10 mL Intravenous PRN Jacquelin Hawking, NP   10 mL at 03/01/20 1143    Review of Systems  Constitutional: Positive for malaise/fatigue. Negative for chills, diaphoresis, fever and weight loss (stable).       Feels "a little tired."  HENT: Negative.  Negative for congestion, ear discharge, ear pain, hearing loss, nosebleeds, sinus pain, sore throat and tinnitus.   Eyes: Negative.  Negative for blurred vision.  Respiratory: Negative.  Negative for cough, hemoptysis, sputum production, shortness of breath and wheezing.   Cardiovascular:  Negative.  Negative for chest pain, palpitations and leg swelling.   Gastrointestinal: Positive for nausea (chronic, stable). Negative for abdominal pain, blood in stool, constipation, diarrhea, heartburn (on Prilosec), melena and vomiting.       Good appetite.  Genitourinary: Negative.  Negative for dysuria, frequency and urgency.  Musculoskeletal: Positive for back pain (chronic, 5/10). Negative for joint pain, myalgias and neck pain.  Skin: Negative.  Negative for itching and rash.  Neurological: Positive for dizziness (occasional) and sensory change (chronic numbness in toes and from fingertips to halfway up forearm, stable). Negative for tingling, weakness and headaches.  Endo/Heme/Allergies: Negative for environmental allergies. Does not bruise/bleed easily.       Diabetes, well controlled.  Psychiatric/Behavioral: Negative for depression and memory loss. The patient has insomnia. The patient is not nervous/anxious.   All other systems reviewed and are negative.   Performance status (ECOG): 1  Vitals Blood pressure 121/75, pulse 76, temperature (!) 97.1 F (36.2 C), resp. rate 16, weight 154 lb 12.2 oz (70.2 kg).  Physical Exam Vitals and nursing note reviewed.  Constitutional:      General: She is not in acute distress.    Appearance: She is well-developed. She is not ill-appearing, toxic-appearing or diaphoretic.     Interventions: Face mask in place.  HENT:     Head: Normocephalic and atraumatic.     Mouth/Throat:     Mouth: Mucous membranes are moist.     Pharynx: Oropharynx is clear.  Eyes:     General: No scleral icterus.    Extraocular Movements: Extraocular movements intact.     Conjunctiva/sclera: Conjunctivae normal.     Pupils: Pupils are equal, round, and reactive to light.     Comments: Glasses. Brown eyes.   Cardiovascular:     Rate and Rhythm: Normal rate and regular rhythm.     Heart sounds: Normal heart sounds. No murmur heard.   Pulmonary:     Effort: Pulmonary effort is normal. No respiratory distress.     Breath  sounds: Normal breath sounds. No wheezing or rales.  Chest:     Chest wall: No tenderness.  Breasts:     Right: No supraclavicular adenopathy.     Left: No supraclavicular adenopathy.    Abdominal:     General: Bowel sounds are normal. There is no distension.     Palpations: Abdomen is soft. There is no hepatomegaly, splenomegaly or mass.     Tenderness: There is no abdominal tenderness. There is no guarding or rebound.  Musculoskeletal:        General: Tenderness (BLE) present. No swelling. Normal range of motion.     Cervical back: Normal range of motion and neck supple.     Right lower leg: No edema.     Left lower leg: No edema.  Lymphadenopathy:     Head:     Right side of head: No preauricular, posterior auricular or occipital adenopathy.     Left side of head: No preauricular, posterior auricular or occipital adenopathy.     Cervical: No cervical adenopathy.     Upper Body:     Right upper body: No supraclavicular adenopathy.     Left upper body: No supraclavicular adenopathy.  Skin:    General: Skin is warm and dry.     Coloration: Skin is not jaundiced or pale.     Findings: No erythema.  Neurological:     Mental Status: She is alert and oriented to person, place, and time.  Mental status is at baseline.  Psychiatric:        Behavior: Behavior normal.        Thought Content: Thought content normal.        Judgment: Judgment normal.    Infusion on 09/14/2020  Component Date Value Ref Range Status  . Sodium 09/14/2020 137  135 - 145 mmol/L Final  . Potassium 09/14/2020 4.2  3.5 - 5.1 mmol/L Final  . Chloride 09/14/2020 100  98 - 111 mmol/L Final  . CO2 09/14/2020 24  22 - 32 mmol/L Final  . Glucose, Bld 09/14/2020 148* 70 - 99 mg/dL Final   Glucose reference range applies only to samples taken after fasting for at least 8 hours.  . BUN 09/14/2020 23  8 - 23 mg/dL Final  . Creatinine, Ser 09/14/2020 1.14* 0.44 - 1.00 mg/dL Final  . Calcium 09/14/2020 9.3  8.9 -  10.3 mg/dL Final  . Total Protein 09/14/2020 7.1  6.5 - 8.1 g/dL Final  . Albumin 09/14/2020 4.3  3.5 - 5.0 g/dL Final  . AST 09/14/2020 33  15 - 41 U/L Final  . ALT 09/14/2020 28  0 - 44 U/L Final  . Alkaline Phosphatase 09/14/2020 96  38 - 126 U/L Final  . Total Bilirubin 09/14/2020 0.1* 0.3 - 1.2 mg/dL Final  . GFR, Estimated 09/14/2020 54* >60 mL/min Final   Comment: (NOTE) Calculated using the CKD-EPI Creatinine Equation (2021)   . Anion gap 09/14/2020 13  5 - 15 Final   Performed at [facility], [address], [place], [place] [address]  . WBC 09/14/2020 4.4  4.0 - 10.5 K/uL Final  . RBC 09/14/2020 3.68* 3.87 - 5.11 MIL/uL Final  . Hemoglobin 09/14/2020 10.0* 12.0 - 15.0 g/dL Final  . HCT 09/14/2020 30.3* 36.0 - 46.0 % Final  . MCV 09/14/2020 82.3  80.0 - 100.0 fL Final  . MCH 09/14/2020 27.2  26.0 - 34.0 pg Final  . MCHC 09/14/2020 33.0  30.0 - 36.0 g/dL Final  . RDW 09/14/2020 12.3  11.5 - 15.5 % Final  . Platelets 09/14/2020 132* 150 - 400 K/uL Final  . nRBC 09/14/2020 0.0  0.0 - 0.2 % Final  . Neutrophils Relative % 09/14/2020 64  % Final  . Neutro Abs 09/14/2020 2.8  1.7 - 7.7 K/uL Final  . Lymphocytes Relative 09/14/2020 21  % Final  . Lymphs Abs 09/14/2020 0.9  0.7 - 4.0 K/uL Final  . Monocytes Relative 09/14/2020 11  % Final  . Monocytes Absolute 09/14/2020 0.5  0.1 - 1.0 K/uL Final  . Eosinophils Relative 09/14/2020 2  % Final  . Eosinophils Absolute 09/14/2020 0.1  0.0 - 0.5 K/uL Final  . Basophils Relative 09/14/2020 1  % Final  . Basophils Absolute 09/14/2020 0.0  0.0 - 0.1 K/uL Final  . Immature Granulocytes 09/14/2020 1  % Final  . Abs Immature Granulocytes 09/14/2020 0.02  0.00 - 0.07 K/uL Final   Performed at [facility], [address] [place], [place] [address]  . Magnesium 09/14/2020 1.4* 1.7 - 2.4 mg/dL Final   Performed at [facility], [address], [place], [place] [address]    Assessment:  Lendy Dittrich is a 65 y.o. 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  Initial bone marrow revealed 80% plasma cells. Lambda  free light chains were 1340. She had nephrotic range proteinuria. She initially underwent induction with RVD. Revlimid maintenance was discontinued on 01/21/2017 secondary to intolerance.   Bone marrowaspirate andbiopsyon 01/18/2021revealed anormocellularmarrow withbut increased lambda-restricted plasma cells (9% aspirate, 40% CD138 immunohistochemistry).Findingswereconsistent with recurrent plasma cell myeloma.Flow cytometry revealed no monoclonal B-cell or phenotypically aberrant T-cell population. Cytogeneticswere 75, XX (normal). 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  M-spikehas been followed: 0 on 04/02/2016 -06/23/2019; 0.2 on 09/02/2017, 0.1 on 11/25/2019, 0.1 on 01/10/2020, 0.2 on 02/07/2020, 0.1 on 03/07/2020, 0 on 04/10/2020, and 06/19/2020.  Lambda light chainshave been followed: 22.2 (ratio 0.56) on 07/03/2017, 30.8 (ratio 0.78) on 09/02/2017, 36.9 (ratio 0.40) on 10/21/2017, 37.4 (ratio 0.41) on 12/16/2017, 70.7(ratio 0.31) on 02/17/2018, 64.2 (ratio 0.27) on 04/07/2018, 78.9 (ratio 0.18) on 05/26/2018, 128.8 (ratio 0.17) on 08/06/2018, 181.5 (ratio 0.13) on 10/08/2018, 130.9 (ratio 0.13) on 10/20/2018, 160.7 (ratio 0.10)on 12/09/2018, 236.6 (ratio 0.07) on 02/01/2019, 363.6 (ratio 0.04) on 03/22/2019, 404.8 (ratio 0.04) on 04/05/2019, 420.7 (ratio 0.03) on 05/24/2019, 573.4 (ratio 0.03) on 06/23/2019, 451.05(ratio 0.02) on02/19/2021, 47.2 (ratio 0.13) on 10/25/2019, 22.4 (ratio 0.21) on 11/25/2019, 16.5 (ratio 0.33) on 01/10/2020, 14.6 (ratio 0.34) on 02/07/2020, 13.1 (ratio 0.31) on 03/07/2020, 10.1 (ratio 0.38) on 04/10/2020, and 9.5 (ratio 0.21) on  06/19/2020.  24 hour UPEPon 06/03/2019 revealed kappa free light chains95.76,lambda free light chains1,260.71, andratio 0.08.24 hourUPEPon 02/22/2021revealed totalproteinof760m/24 hrs withlambdafree light chains 1,084.117mL andratioof0.10(1.03-31.76). M spike inurinewas46.1%(36157m4 hrs).  Bone surveyon 04/08/2016 and11/28/2018 revealed no definite lytic lesion seen in the visualized skeleton.Bone surveyon 11/19/2018 revealed no suspicious lucent lesionsand no acute bony abnormality.PET scanon 07/12/2019 revealed no focal metabolic activity to suggest active myeloma within the skeleton. There wereno lytic lesions identified on the CT portion of the examorsoft tissue plasmacytomas. There was no evidence of multiple myeloma.  Pretreatment RBC phenotypeon 09/23/2019 waspositivefor C, e, DUFFY B, KIDD B, M, S, and s antigen; negativefor c, E, KELL, DUFFY A, KIDD A, and N antigen.  Shereceived 6 cycles of daratumumab and hyaluronidase-fihj, Pomalyst, and Decadron (DPd)(09/27/2019 - 10/25/2019; 12/09/2019 - 03/13/2020). Cycle #1 was complicated by fever and neutropenia requiring admission.  Cycle #2 was complicated by pneumonia requiring admission.  Cycle #6 was complicated with an ER evaluation for an elevated lactic acid.  She is day 127 s/p [facility].  She underwent conditioning with melphalan 140 mg/m2.    She is s/p week #1 Velcade maintenance (began 08/31/2020).  She has no increase in neuropathy.  She has severe aortic stenosis.  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 Cardiology is following for a possible TAVR in the future.  Echo on 07/05/2020 revealed moderate to severe aortic stenosis with an EF of 50-55%.  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 Zometa was discontinued in 01/2017. She  has chronic nauseaon Phenergan.  She has B12 deficiency. B12 was 254 on 04/09/2017, 295 on 08/20/2018, and 391 on 10/08/2018. Shewasonoral B12.She received B12 monthly (last02/17/2022).Folate was12.1on 01/10/2020.  She has iron deficiency. Ferritin was 32 on 07/01/2019. She received Venoferon 07/15/2019 and 07/22/2019.  She has hypogammaglobulinemia.  IgG was 245 on 11/25/2019.  She received monthly IVIG (07/22/021 - 03/22/2020).  She received IVIG 400 mg/kg on 01/20/2020, 200 mg/kg on 02/17/2020, and 300 mg/kg on 03/22/2020.  IVIG on 07/84/16/6063s complicated by acute renal failure. IgG trough level was 418 on 02/17/2020.  She  was admitted to [facility] 10/15/2019 - 10/20/2019 with fever and neutropenia. Cultures were negative. CXR was negative. She received broad spectrum antibiotics and daily Granix. She received IVF for acute renal insufficiency due to diarrhea and dehydration. Creatine was 1.66 on admission and 1.12 on discharge.  She was admitted to [facility] from 03/01/2020 - 03/05/2020 with fever and neutropenia. CXR revealed no active cardiopulmonary disease. Chest CT with contrast revealed no acute intrathoracic pathology. There were findings which could be suggestive of prior granulomatous disease. She was treated with Cefepime and Vancomycin, then switched to ciprofloxacin on 03/03/2020.   The patient was admitted to [facility] from 05/08/2020 - 06/04/2020 for autologous bone marrow transplant.   She received conditioning with melphalan 140 mg/m2.  Bone marrow transplant was on 05/10/2020. The patient developed fevers daily from 05/19/2020 - 05/24/2020. Blood cultures were + for strept sanguis bacteremia.  She was treated cefepime, vancomycin, and solumedrol for possible engraftment syndrome. Cefepime was switched to ceftriaxone; she completed 2 weeks of ceftriaxone on 06/03/2020. She had chemotherapy induced diarrhea.  She experienced urinary retention.  She received the  [place] 09/20/2019 and 10/09/2019. She received the Booster on 08/10/2020. She received the tetanus shot on 08/20/2020. She received the shingles vaccine recently.  Symptomatically, she has been "a little tired." She has not been sleeping well. Her neuropathy, dizziness, and back pain are stable. Her nausea is stable but her appetite is good.  Exam is stable.  Plan: 1.   Labs today: CBC with diff, CMP, Mg 2. Stage III multiple myeloma  Symptomatically, she appears to be doing well.   She has a stable neuropathy.   Exam is unremarkable.  Lambda free light chains have improved from451.05on02/19/2021. After cycle #1: 47.2 (ratio 0.13). After cycle #6:10.1 (ratio 0.38).   After transplant: 9.5 (ratio 0.21).  She is day 127 s/p stem cell transplant #2 (05/10/2020).  Infection prophylaxis   Valtrex through 1 year s/p transplant.  Vaccination   Anticipate initiation at [facility] after day 60.  Post transplant RN coordinator [phone]).  Labs reviewed.  Week #2 Velcade.  Discuss symptom management.  She has antiemetics at home to use on a prn bases.  Interventions are adequate.  3.   Normocytic anemia  Hematocrit 30.3.  Hemoglobin 10.0.  MCV 82.3 on 09/14/2020.  Continue to monitor. 4. Grade II peripheral neuropathy  Neuropathy is slightly worse s/p 2nd transplant.  Neuropathy appears stable after her first dose of Velcade.  Continue to monitor. 5.   Hypogammaglobulinemia             She has a history of recurrent infections.               She received 4 doses of IVIG (last 04/24/2020). Goal IgGtrough level is 500.   Transplant team wishes to hold on monthly IVIG unless recurrent infections documented again. 6.   Chronic hypomagnesemia Magnesium1.4 today.  Magnesium 4 gm IV today.  Continue magnesium checks twice weekly (Mondays and Thursdays). 7.   Chronic  nausea  Continue Phenergan IV when she is in clinic.  Patient has Phenergan p.o. to use at home. 8.   Aortic stenosis  Echo on 05/21/2020 revealed severe aortic stenosis (tricuspid valve but 2 leaflets fused) and EF 45-50%.  Echo on 07/05/2020 revealed moderate to severe aortic stenosis with an EF of 50-55%.  She has a follow-up echo in 6 months. 9.   B12 deficiency  Patient receives B12 monthly (last 06/12/2020).  B12 today. 10.  Phenergan IV today. 11.   Magnesium 4 gm IV. 12.   Week #2 Velcade. 13.   B12 today and monthly x 6. 14.   RTC on Monday and Thursdays x 3 weeks for labs (Mg) and +/- IV Mg and Phenergan. 15.   RTC in 2 weeks for labs (CBC with diff, CMP, Mg), week #3 Velcade and IV Mg. 16.   RTC in 4 weeks for MD assessment, labs (CBC with diff, CMP, Mg, SPEP, FLCA), B12, and week #4 Velcade.  I discussed the assessment and treatment plan with the patient.  The patient was provided an opportunity to ask questions and all were answered.  The patient agreed with the plan and demonstrated an understanding of the instructions.  The patient was advised to call back if the symptoms worsen or if the condition fails to improve as anticipated.    Lequita Asal, MD, PhD    09/14/2020, 9:35 AM   I, Mirian Mo Tufford, am acting as Education administrator for [employer]. Mike Gip, MD, PhD.  I, Druanne Bosques C. Mike Gip, MD, have reviewed the above documentation for accuracy and completeness, and I agree with the above.

## 2020-09-14 ENCOUNTER — Inpatient Hospital Stay: Payer: Medicare HMO

## 2020-09-14 ENCOUNTER — Encounter: Payer: Self-pay | Admitting: Hematology and Oncology

## 2020-09-14 ENCOUNTER — Other Ambulatory Visit: Payer: Self-pay

## 2020-09-14 ENCOUNTER — Inpatient Hospital Stay: Payer: Medicare HMO | Admitting: Hematology and Oncology

## 2020-09-14 VITALS — BP 121/75 | HR 76 | Temp 97.1°F | Resp 16 | Wt 154.8 lb

## 2020-09-14 DIAGNOSIS — D649 Anemia, unspecified: Secondary | ICD-10-CM

## 2020-09-14 DIAGNOSIS — Z9484 Stem cells transplant status: Secondary | ICD-10-CM | POA: Diagnosis not present

## 2020-09-14 DIAGNOSIS — G620 Drug-induced polyneuropathy: Secondary | ICD-10-CM

## 2020-09-14 DIAGNOSIS — Z5112 Encounter for antineoplastic immunotherapy: Secondary | ICD-10-CM | POA: Diagnosis not present

## 2020-09-14 DIAGNOSIS — T451X5A Adverse effect of antineoplastic and immunosuppressive drugs, initial encounter: Secondary | ICD-10-CM

## 2020-09-14 DIAGNOSIS — Z5111 Encounter for antineoplastic chemotherapy: Secondary | ICD-10-CM

## 2020-09-14 DIAGNOSIS — I129 Hypertensive chronic kidney disease with stage 1 through stage 4 chronic kidney disease, or unspecified chronic kidney disease: Secondary | ICD-10-CM | POA: Diagnosis not present

## 2020-09-14 DIAGNOSIS — E538 Deficiency of other specified B group vitamins: Secondary | ICD-10-CM | POA: Diagnosis not present

## 2020-09-14 DIAGNOSIS — G629 Polyneuropathy, unspecified: Secondary | ICD-10-CM | POA: Diagnosis not present

## 2020-09-14 DIAGNOSIS — R110 Nausea: Secondary | ICD-10-CM | POA: Diagnosis not present

## 2020-09-14 DIAGNOSIS — R197 Diarrhea, unspecified: Secondary | ICD-10-CM | POA: Diagnosis not present

## 2020-09-14 DIAGNOSIS — M549 Dorsalgia, unspecified: Secondary | ICD-10-CM | POA: Diagnosis not present

## 2020-09-14 DIAGNOSIS — C9000 Multiple myeloma not having achieved remission: Secondary | ICD-10-CM

## 2020-09-14 DIAGNOSIS — N183 Chronic kidney disease, stage 3 unspecified: Secondary | ICD-10-CM | POA: Diagnosis not present

## 2020-09-14 LAB — COMPREHENSIVE METABOLIC PANEL
ALT: 28 U/L (ref 0–44)
AST: 33 U/L (ref 15–41)
Albumin: 4.3 g/dL (ref 3.5–5.0)
Alkaline Phosphatase: 96 U/L (ref 38–126)
Anion gap: 13 (ref 5–15)
BUN: 23 mg/dL (ref 8–23)
CO2: 24 mmol/L (ref 22–32)
Calcium: 9.3 mg/dL (ref 8.9–10.3)
Chloride: 100 mmol/L (ref 98–111)
Creatinine, Ser: 1.14 mg/dL — ABNORMAL HIGH (ref 0.44–1.00)
GFR, Estimated: 54 mL/min — ABNORMAL LOW (ref >60)
Glucose, Bld: 148 mg/dL — ABNORMAL HIGH (ref 70–99)
Potassium: 4.2 mmol/L (ref 3.5–5.1)
Sodium: 137 mmol/L (ref 135–145)
Total Bilirubin: 0.1 mg/dL — ABNORMAL LOW (ref 0.3–1.2)
Total Protein: 7.1 g/dL (ref 6.5–8.1)

## 2020-09-14 LAB — CBC WITH DIFFERENTIAL/PLATELET
Abs Immature Granulocytes: 0.02 10*3/uL (ref 0.00–0.07)
Basophils Absolute: 0 10*3/uL (ref 0.0–0.1)
Basophils Relative: 1 %
Eosinophils Absolute: 0.1 10*3/uL (ref 0.0–0.5)
Eosinophils Relative: 2 %
HCT: 30.3 % — ABNORMAL LOW (ref 36.0–46.0)
Hemoglobin: 10 g/dL — ABNORMAL LOW (ref 12.0–15.0)
Immature Granulocytes: 1 %
Lymphocytes Relative: 21 %
Lymphs Abs: 0.9 10*3/uL (ref 0.7–4.0)
MCH: 27.2 pg (ref 26.0–34.0)
MCHC: 33 g/dL (ref 30.0–36.0)
MCV: 82.3 fL (ref 80.0–100.0)
Monocytes Absolute: 0.5 10*3/uL (ref 0.1–1.0)
Monocytes Relative: 11 %
Neutro Abs: 2.8 10*3/uL (ref 1.7–7.7)
Neutrophils Relative %: 64 %
Platelets: 132 10*3/uL — ABNORMAL LOW (ref 150–400)
RBC: 3.68 MIL/uL — ABNORMAL LOW (ref 3.87–5.11)
RDW: 12.3 % (ref 11.5–15.5)
WBC: 4.4 10*3/uL (ref 4.0–10.5)
nRBC: 0 % (ref 0.0–0.2)

## 2020-09-14 LAB — MAGNESIUM: Magnesium: 1.4 mg/dL — ABNORMAL LOW (ref 1.7–2.4)

## 2020-09-14 MED ORDER — CYANOCOBALAMIN 1000 MCG/ML IJ SOLN
1000.0000 ug | Freq: Once | INTRAMUSCULAR | Status: AC
  Administered 2020-09-14: 1000 ug via INTRAMUSCULAR
  Filled 2020-09-14: qty 1

## 2020-09-14 MED ORDER — SODIUM CHLORIDE 0.9 % IV SOLN
25.0000 mg | Freq: Once | INTRAVENOUS | Status: AC
  Administered 2020-09-14: 25 mg via INTRAVENOUS
  Filled 2020-09-14: qty 1

## 2020-09-14 MED ORDER — SODIUM CHLORIDE 0.9 % IV SOLN: 4.0000 g | Freq: Once | INTRAVENOUS | Status: DC

## 2020-09-14 MED ORDER — MAGNESIUM SULFATE 4 GM/100ML IV SOLN: 4.0000 g | Freq: Once | INTRAVENOUS | Status: AC

## 2020-09-14 MED ORDER — SODIUM CHLORIDE 0.9 % IV SOLN
INTRAVENOUS | Status: DC
  Filled 2020-09-14: qty 250

## 2020-09-14 MED ORDER — HEPARIN SOD (PORK) LOCK FLUSH 100 UNIT/ML IV SOLN
500.0000 [IU] | Freq: Once | INTRAVENOUS | Status: AC
  Administered 2020-09-14: 500 [IU] via INTRAVENOUS
  Filled 2020-09-14: qty 5

## 2020-09-14 MED ORDER — SODIUM CHLORIDE 0.9 % IV SOLN
Freq: Once | INTRAVENOUS | Status: AC
  Filled 2020-09-14: qty 500

## 2020-09-14 MED ORDER — BORTEZOMIB CHEMO IV INJECTION 3.5 MG (1MG/ML-GENERIC)
2.0000 mg | Freq: Once | INTRAVENOUS | Status: AC
  Administered 2020-09-14: 2 mg via INTRAVENOUS
  Filled 2020-09-14: qty 2

## 2020-09-14 MED ORDER — MAGNESIUM SULFATE 4 GM/100ML IV SOLN
4.0000 g | Freq: Once | INTRAVENOUS | Status: AC
  Administered 2020-09-14: 4 g via INTRAVENOUS
  Filled 2020-09-14: qty 100

## 2020-09-14 NOTE — Progress Notes (Signed)
Pt in for follow up, states having lower back pain rated at a 5.  States appetite has been decreased some.  Denies any other concerns.

## 2020-09-14 NOTE — Progress Notes (Signed)
Pt received prescribed treatment in clinic, pt stable at d/c. 

## 2020-09-18 ENCOUNTER — Other Ambulatory Visit: Payer: Self-pay

## 2020-09-18 ENCOUNTER — Inpatient Hospital Stay: Payer: Medicare HMO

## 2020-09-18 DIAGNOSIS — R197 Diarrhea, unspecified: Secondary | ICD-10-CM | POA: Diagnosis not present

## 2020-09-18 DIAGNOSIS — I129 Hypertensive chronic kidney disease with stage 1 through stage 4 chronic kidney disease, or unspecified chronic kidney disease: Secondary | ICD-10-CM | POA: Diagnosis not present

## 2020-09-18 DIAGNOSIS — R110 Nausea: Secondary | ICD-10-CM | POA: Diagnosis not present

## 2020-09-18 DIAGNOSIS — G629 Polyneuropathy, unspecified: Secondary | ICD-10-CM | POA: Diagnosis not present

## 2020-09-18 DIAGNOSIS — Z9484 Stem cells transplant status: Secondary | ICD-10-CM | POA: Diagnosis not present

## 2020-09-18 DIAGNOSIS — Z5112 Encounter for antineoplastic immunotherapy: Secondary | ICD-10-CM | POA: Diagnosis not present

## 2020-09-18 DIAGNOSIS — C9000 Multiple myeloma not having achieved remission: Secondary | ICD-10-CM

## 2020-09-18 DIAGNOSIS — N183 Chronic kidney disease, stage 3 unspecified: Secondary | ICD-10-CM | POA: Diagnosis not present

## 2020-09-18 DIAGNOSIS — M549 Dorsalgia, unspecified: Secondary | ICD-10-CM | POA: Diagnosis not present

## 2020-09-18 LAB — MAGNESIUM: Magnesium: 1.3 mg/dL — ABNORMAL LOW (ref 1.7–2.4)

## 2020-09-18 MED ORDER — SODIUM CHLORIDE 0.9 % IV SOLN: 4.0000 g | Freq: Once | INTRAVENOUS | Status: DC

## 2020-09-18 MED ORDER — SODIUM CHLORIDE 0.9 % IV SOLN
25.0000 mg | Freq: Once | INTRAVENOUS | Status: AC
  Administered 2020-09-18: 25 mg via INTRAVENOUS
  Filled 2020-09-18: qty 1

## 2020-09-18 MED ORDER — MAGNESIUM SULFATE 4 GM/100ML IV SOLN
4.0000 g | Freq: Once | INTRAVENOUS | Status: AC
  Administered 2020-09-18: 4 g via INTRAVENOUS
  Filled 2020-09-18: qty 100

## 2020-09-18 MED ORDER — HEPARIN SOD (PORK) LOCK FLUSH 100 UNIT/ML IV SOLN
500.0000 [IU] | Freq: Once | INTRAVENOUS | Status: AC | PRN
  Administered 2020-09-18: 500 [IU]
  Filled 2020-09-18: qty 5

## 2020-09-21 ENCOUNTER — Inpatient Hospital Stay: Payer: Medicare HMO

## 2020-09-21 ENCOUNTER — Other Ambulatory Visit: Payer: Self-pay | Admitting: Hematology and Oncology

## 2020-09-21 ENCOUNTER — Other Ambulatory Visit: Payer: Self-pay

## 2020-09-21 VITALS — BP 129/79 | HR 79 | Temp 97.0°F | Resp 20

## 2020-09-21 DIAGNOSIS — R197 Diarrhea, unspecified: Secondary | ICD-10-CM | POA: Diagnosis not present

## 2020-09-21 DIAGNOSIS — C9000 Multiple myeloma not having achieved remission: Secondary | ICD-10-CM | POA: Diagnosis not present

## 2020-09-21 DIAGNOSIS — Z5112 Encounter for antineoplastic immunotherapy: Secondary | ICD-10-CM | POA: Diagnosis not present

## 2020-09-21 DIAGNOSIS — I129 Hypertensive chronic kidney disease with stage 1 through stage 4 chronic kidney disease, or unspecified chronic kidney disease: Secondary | ICD-10-CM | POA: Diagnosis not present

## 2020-09-21 DIAGNOSIS — G629 Polyneuropathy, unspecified: Secondary | ICD-10-CM | POA: Diagnosis not present

## 2020-09-21 DIAGNOSIS — R110 Nausea: Secondary | ICD-10-CM | POA: Diagnosis not present

## 2020-09-21 DIAGNOSIS — Z9484 Stem cells transplant status: Secondary | ICD-10-CM | POA: Diagnosis not present

## 2020-09-21 DIAGNOSIS — M549 Dorsalgia, unspecified: Secondary | ICD-10-CM | POA: Diagnosis not present

## 2020-09-21 DIAGNOSIS — N183 Chronic kidney disease, stage 3 unspecified: Secondary | ICD-10-CM | POA: Diagnosis not present

## 2020-09-21 LAB — MAGNESIUM: Magnesium: 1.4 mg/dL — ABNORMAL LOW (ref 1.7–2.4)

## 2020-09-21 MED ORDER — SODIUM CHLORIDE 0.9 % IV SOLN
25.0000 mg | Freq: Once | INTRAVENOUS | Status: AC
  Administered 2020-09-21: 25 mg via INTRAVENOUS
  Filled 2020-09-21: qty 1

## 2020-09-21 MED ORDER — HEPARIN SOD (PORK) LOCK FLUSH 100 UNIT/ML IV SOLN
500.0000 [IU] | Freq: Once | INTRAVENOUS | Status: AC
  Administered 2020-09-21: 500 [IU] via INTRAVENOUS
  Filled 2020-09-21: qty 5

## 2020-09-21 MED ORDER — SODIUM CHLORIDE 0.9 % IV SOLN
Freq: Once | INTRAVENOUS | Status: AC
  Filled 2020-09-21: qty 250

## 2020-09-21 MED ORDER — MAGNESIUM SULFATE 4 GM/100ML IV SOLN
4.0000 g | Freq: Once | INTRAVENOUS | Status: AC
  Administered 2020-09-21: 4 g via INTRAVENOUS
  Filled 2020-09-21: qty 100

## 2020-09-25 ENCOUNTER — Inpatient Hospital Stay: Payer: Medicare HMO

## 2020-09-25 ENCOUNTER — Other Ambulatory Visit: Payer: Self-pay

## 2020-09-25 DIAGNOSIS — Z9484 Stem cells transplant status: Secondary | ICD-10-CM | POA: Diagnosis not present

## 2020-09-25 DIAGNOSIS — M549 Dorsalgia, unspecified: Secondary | ICD-10-CM | POA: Diagnosis not present

## 2020-09-25 DIAGNOSIS — Z95828 Presence of other vascular implants and grafts: Secondary | ICD-10-CM

## 2020-09-25 DIAGNOSIS — N183 Chronic kidney disease, stage 3 unspecified: Secondary | ICD-10-CM | POA: Diagnosis not present

## 2020-09-25 DIAGNOSIS — G629 Polyneuropathy, unspecified: Secondary | ICD-10-CM | POA: Diagnosis not present

## 2020-09-25 DIAGNOSIS — R110 Nausea: Secondary | ICD-10-CM | POA: Diagnosis not present

## 2020-09-25 DIAGNOSIS — I129 Hypertensive chronic kidney disease with stage 1 through stage 4 chronic kidney disease, or unspecified chronic kidney disease: Secondary | ICD-10-CM | POA: Diagnosis not present

## 2020-09-25 DIAGNOSIS — R197 Diarrhea, unspecified: Secondary | ICD-10-CM | POA: Diagnosis not present

## 2020-09-25 DIAGNOSIS — C9000 Multiple myeloma not having achieved remission: Secondary | ICD-10-CM | POA: Diagnosis not present

## 2020-09-25 DIAGNOSIS — Z5112 Encounter for antineoplastic immunotherapy: Secondary | ICD-10-CM | POA: Diagnosis not present

## 2020-09-25 LAB — MAGNESIUM: Magnesium: 1.3 mg/dL — ABNORMAL LOW (ref 1.7–2.4)

## 2020-09-25 MED ORDER — SODIUM CHLORIDE 0.9 % IV SOLN
6.0000 g | Freq: Once | INTRAVENOUS | Status: AC
  Administered 2020-09-25: 6 g via INTRAVENOUS
  Filled 2020-09-25: qty 12

## 2020-09-25 MED ORDER — SODIUM CHLORIDE 0.9 % IV SOLN
25.0000 mg | Freq: Once | INTRAVENOUS | Status: AC
  Administered 2020-09-25: 25 mg via INTRAVENOUS
  Filled 2020-09-25: qty 1

## 2020-09-25 MED ORDER — HEPARIN SOD (PORK) LOCK FLUSH 100 UNIT/ML IV SOLN
500.0000 [IU] | Freq: Once | INTRAVENOUS | Status: AC
  Administered 2020-09-25: 500 [IU] via INTRAVENOUS
  Filled 2020-09-25: qty 5

## 2020-09-25 MED ORDER — SODIUM CHLORIDE 0.9 % IV SOLN
INTRAVENOUS | Status: DC
  Filled 2020-09-25: qty 250

## 2020-09-25 NOTE — Progress Notes (Signed)
Pt received prescribed treatment in clinic, pt stable at d/c. 

## 2020-09-28 ENCOUNTER — Other Ambulatory Visit: Payer: Self-pay

## 2020-09-28 ENCOUNTER — Inpatient Hospital Stay: Payer: Medicare HMO

## 2020-09-28 ENCOUNTER — Other Ambulatory Visit: Payer: Self-pay | Admitting: Hematology and Oncology

## 2020-09-28 VITALS — BP 114/77 | HR 78 | Temp 97.8°F | Resp 18

## 2020-09-28 DIAGNOSIS — G629 Polyneuropathy, unspecified: Secondary | ICD-10-CM | POA: Diagnosis not present

## 2020-09-28 DIAGNOSIS — Z5112 Encounter for antineoplastic immunotherapy: Secondary | ICD-10-CM | POA: Diagnosis not present

## 2020-09-28 DIAGNOSIS — C9000 Multiple myeloma not having achieved remission: Secondary | ICD-10-CM

## 2020-09-28 DIAGNOSIS — R197 Diarrhea, unspecified: Secondary | ICD-10-CM | POA: Diagnosis not present

## 2020-09-28 DIAGNOSIS — R110 Nausea: Secondary | ICD-10-CM | POA: Diagnosis not present

## 2020-09-28 DIAGNOSIS — Z9484 Stem cells transplant status: Secondary | ICD-10-CM | POA: Diagnosis not present

## 2020-09-28 DIAGNOSIS — M549 Dorsalgia, unspecified: Secondary | ICD-10-CM | POA: Diagnosis not present

## 2020-09-28 DIAGNOSIS — I129 Hypertensive chronic kidney disease with stage 1 through stage 4 chronic kidney disease, or unspecified chronic kidney disease: Secondary | ICD-10-CM | POA: Diagnosis not present

## 2020-09-28 DIAGNOSIS — N183 Chronic kidney disease, stage 3 unspecified: Secondary | ICD-10-CM | POA: Diagnosis not present

## 2020-09-28 LAB — COMPREHENSIVE METABOLIC PANEL
ALT: 37 U/L (ref 0–44)
AST: 37 U/L (ref 15–41)
Albumin: 4.3 g/dL (ref 3.5–5.0)
Alkaline Phosphatase: 91 U/L (ref 38–126)
Anion gap: 7 (ref 5–15)
BUN: 24 mg/dL — ABNORMAL HIGH (ref 8–23)
CO2: 20 mmol/L — ABNORMAL LOW (ref 22–32)
Calcium: 8.6 mg/dL — ABNORMAL LOW (ref 8.9–10.3)
Chloride: 110 mmol/L (ref 98–111)
Creatinine, Ser: 1.18 mg/dL — ABNORMAL HIGH (ref 0.44–1.00)
GFR, Estimated: 52 mL/min — ABNORMAL LOW (ref >60)
Glucose, Bld: 155 mg/dL — ABNORMAL HIGH (ref 70–99)
Potassium: 3.9 mmol/L (ref 3.5–5.1)
Sodium: 137 mmol/L (ref 135–145)
Total Bilirubin: 0.3 mg/dL (ref 0.3–1.2)
Total Protein: 6.7 g/dL (ref 6.5–8.1)

## 2020-09-28 LAB — CBC WITH DIFFERENTIAL/PLATELET
Abs Immature Granulocytes: 0.02 10*3/uL (ref 0.00–0.07)
Basophils Absolute: 0 10*3/uL (ref 0.0–0.1)
Basophils Relative: 0 %
Eosinophils Absolute: 0.1 10*3/uL (ref 0.0–0.5)
Eosinophils Relative: 2 %
HCT: 30.1 % — ABNORMAL LOW (ref 36.0–46.0)
Hemoglobin: 9.8 g/dL — ABNORMAL LOW (ref 12.0–15.0)
Immature Granulocytes: 0 %
Lymphocytes Relative: 22 %
Lymphs Abs: 1.2 10*3/uL (ref 0.7–4.0)
MCH: 26.1 pg (ref 26.0–34.0)
MCHC: 32.6 g/dL (ref 30.0–36.0)
MCV: 80.3 fL (ref 80.0–100.0)
Monocytes Absolute: 0.6 10*3/uL (ref 0.1–1.0)
Monocytes Relative: 10 %
Neutro Abs: 3.7 10*3/uL (ref 1.7–7.7)
Neutrophils Relative %: 66 %
Platelets: 142 10*3/uL — ABNORMAL LOW (ref 150–400)
RBC: 3.75 MIL/uL — ABNORMAL LOW (ref 3.87–5.11)
RDW: 12.6 % (ref 11.5–15.5)
WBC: 5.6 10*3/uL (ref 4.0–10.5)
nRBC: 0 % (ref 0.0–0.2)

## 2020-09-28 LAB — MAGNESIUM: Magnesium: 1.3 mg/dL — ABNORMAL LOW (ref 1.7–2.4)

## 2020-09-28 MED ORDER — HEPARIN SOD (PORK) LOCK FLUSH 100 UNIT/ML IV SOLN
500.0000 [IU] | Freq: Once | INTRAVENOUS | Status: AC
  Administered 2020-09-28: 500 [IU] via INTRAVENOUS
  Filled 2020-09-28: qty 5

## 2020-09-28 MED ORDER — SODIUM CHLORIDE 0.9 % IV SOLN
25.0000 mg | Freq: Once | INTRAVENOUS | Status: AC
  Administered 2020-09-28: 25 mg via INTRAVENOUS
  Filled 2020-09-28: qty 1

## 2020-09-28 MED ORDER — SODIUM CHLORIDE 0.9 % IV SOLN
Freq: Once | INTRAVENOUS | Status: AC
  Filled 2020-09-28: qty 250

## 2020-09-28 MED ORDER — SODIUM CHLORIDE 0.9 % IV SOLN
6.0000 g | Freq: Once | INTRAVENOUS | Status: AC
  Administered 2020-09-28: 6 g via INTRAVENOUS
  Filled 2020-09-28: qty 12

## 2020-09-28 MED ORDER — BORTEZOMIB CHEMO IV INJECTION 3.5 MG (1MG/ML-GENERIC)
2.0000 mg | Freq: Once | INTRAVENOUS | Status: AC
  Administered 2020-09-28: 2 mg via INTRAVENOUS
  Filled 2020-09-28: qty 2

## 2020-10-02 ENCOUNTER — Other Ambulatory Visit: Payer: Self-pay

## 2020-10-02 ENCOUNTER — Inpatient Hospital Stay: Payer: Medicare HMO | Attending: Hematology and Oncology

## 2020-10-02 ENCOUNTER — Ambulatory Visit: Payer: Medicare PPO

## 2020-10-02 ENCOUNTER — Inpatient Hospital Stay: Payer: Medicare HMO

## 2020-10-02 DIAGNOSIS — I350 Nonrheumatic aortic (valve) stenosis: Secondary | ICD-10-CM | POA: Insufficient documentation

## 2020-10-02 DIAGNOSIS — R5383 Other fatigue: Secondary | ICD-10-CM | POA: Diagnosis not present

## 2020-10-02 DIAGNOSIS — Z832 Family history of diseases of the blood and blood-forming organs and certain disorders involving the immune mechanism: Secondary | ICD-10-CM | POA: Insufficient documentation

## 2020-10-02 DIAGNOSIS — Z885 Allergy status to narcotic agent status: Secondary | ICD-10-CM | POA: Insufficient documentation

## 2020-10-02 DIAGNOSIS — Z801 Family history of malignant neoplasm of trachea, bronchus and lung: Secondary | ICD-10-CM | POA: Diagnosis not present

## 2020-10-02 DIAGNOSIS — R197 Diarrhea, unspecified: Secondary | ICD-10-CM | POA: Insufficient documentation

## 2020-10-02 DIAGNOSIS — Z8379 Family history of other diseases of the digestive system: Secondary | ICD-10-CM | POA: Insufficient documentation

## 2020-10-02 DIAGNOSIS — Z79899 Other long term (current) drug therapy: Secondary | ICD-10-CM | POA: Insufficient documentation

## 2020-10-02 DIAGNOSIS — R110 Nausea: Secondary | ICD-10-CM | POA: Insufficient documentation

## 2020-10-02 DIAGNOSIS — Z8249 Family history of ischemic heart disease and other diseases of the circulatory system: Secondary | ICD-10-CM | POA: Insufficient documentation

## 2020-10-02 DIAGNOSIS — E538 Deficiency of other specified B group vitamins: Secondary | ICD-10-CM | POA: Insufficient documentation

## 2020-10-02 DIAGNOSIS — R531 Weakness: Secondary | ICD-10-CM | POA: Insufficient documentation

## 2020-10-02 DIAGNOSIS — Z87891 Personal history of nicotine dependence: Secondary | ICD-10-CM | POA: Diagnosis not present

## 2020-10-02 DIAGNOSIS — Z833 Family history of diabetes mellitus: Secondary | ICD-10-CM | POA: Insufficient documentation

## 2020-10-02 DIAGNOSIS — D509 Iron deficiency anemia, unspecified: Secondary | ICD-10-CM | POA: Diagnosis not present

## 2020-10-02 DIAGNOSIS — Z808 Family history of malignant neoplasm of other organs or systems: Secondary | ICD-10-CM | POA: Insufficient documentation

## 2020-10-02 DIAGNOSIS — Z800 Family history of malignant neoplasm of digestive organs: Secondary | ICD-10-CM | POA: Insufficient documentation

## 2020-10-02 DIAGNOSIS — Z822 Family history of deafness and hearing loss: Secondary | ICD-10-CM | POA: Insufficient documentation

## 2020-10-02 DIAGNOSIS — Z9049 Acquired absence of other specified parts of digestive tract: Secondary | ICD-10-CM | POA: Diagnosis not present

## 2020-10-02 DIAGNOSIS — G620 Drug-induced polyneuropathy: Secondary | ICD-10-CM | POA: Insufficient documentation

## 2020-10-02 DIAGNOSIS — D801 Nonfamilial hypogammaglobulinemia: Secondary | ICD-10-CM | POA: Insufficient documentation

## 2020-10-02 DIAGNOSIS — G629 Polyneuropathy, unspecified: Secondary | ICD-10-CM | POA: Diagnosis not present

## 2020-10-02 DIAGNOSIS — T451X5A Adverse effect of antineoplastic and immunosuppressive drugs, initial encounter: Secondary | ICD-10-CM | POA: Insufficient documentation

## 2020-10-02 DIAGNOSIS — Z5112 Encounter for antineoplastic immunotherapy: Secondary | ICD-10-CM | POA: Insufficient documentation

## 2020-10-02 DIAGNOSIS — D709 Neutropenia, unspecified: Secondary | ICD-10-CM | POA: Diagnosis not present

## 2020-10-02 DIAGNOSIS — R0602 Shortness of breath: Secondary | ICD-10-CM | POA: Insufficient documentation

## 2020-10-02 DIAGNOSIS — Z888 Allergy status to other drugs, medicaments and biological substances status: Secondary | ICD-10-CM | POA: Insufficient documentation

## 2020-10-02 DIAGNOSIS — C9000 Multiple myeloma not having achieved remission: Secondary | ICD-10-CM | POA: Insufficient documentation

## 2020-10-02 DIAGNOSIS — Z803 Family history of malignant neoplasm of breast: Secondary | ICD-10-CM | POA: Insufficient documentation

## 2020-10-02 DIAGNOSIS — Z886 Allergy status to analgesic agent status: Secondary | ICD-10-CM | POA: Diagnosis not present

## 2020-10-02 DIAGNOSIS — Z841 Family history of disorders of kidney and ureter: Secondary | ICD-10-CM | POA: Insufficient documentation

## 2020-10-02 LAB — MAGNESIUM: Magnesium: 1.3 mg/dL — ABNORMAL LOW (ref 1.7–2.4)

## 2020-10-02 MED ORDER — HEPARIN SOD (PORK) LOCK FLUSH 100 UNIT/ML IV SOLN
500.0000 [IU] | Freq: Once | INTRAVENOUS | Status: AC
Start: 2020-10-02 — End: 2020-10-02
  Administered 2020-10-02: 500 [IU] via INTRAVENOUS
  Filled 2020-10-02: qty 5

## 2020-10-02 MED ORDER — MAGNESIUM SULFATE 2 GM/50ML IV SOLN
2.0000 g | Freq: Once | INTRAVENOUS | Status: AC
Start: 2020-10-02 — End: 2020-10-02
  Administered 2020-10-02: 2 g via INTRAVENOUS
  Filled 2020-10-02: qty 50

## 2020-10-02 MED ORDER — HEPARIN SOD (PORK) LOCK FLUSH 100 UNIT/ML IV SOLN
INTRAVENOUS | Status: AC
  Filled 2020-10-02: qty 5

## 2020-10-02 MED ORDER — SODIUM CHLORIDE 0.9 % IV SOLN
Freq: Once | INTRAVENOUS | Status: AC
  Filled 2020-10-02: qty 250

## 2020-10-02 MED ORDER — LOPERAMIDE HCL 2 MG PO CAPS: 2.0000 mg | ORAL_CAPSULE | Freq: Four times a day (QID) | ORAL | 1 refills | Status: DC | PRN

## 2020-10-02 MED ORDER — SODIUM CHLORIDE 0.9 % IV SOLN
6.0000 g | Freq: Once | INTRAVENOUS | Status: DC
  Filled 2020-10-02: qty 12

## 2020-10-02 MED ORDER — MAGNESIUM SULFATE 4 GM/100ML IV SOLN
4.0000 g | Freq: Once | INTRAVENOUS | Status: AC
  Administered 2020-10-02: 4 g via INTRAVENOUS
  Filled 2020-10-02: qty 100

## 2020-10-02 MED ORDER — SODIUM CHLORIDE 0.9 % IV SOLN
25.0000 mg | Freq: Once | INTRAVENOUS | Status: AC
  Administered 2020-10-02: 25 mg via INTRAVENOUS
  Filled 2020-10-02: qty 1

## 2020-10-02 MED ORDER — PROMETHAZINE HCL 25 MG PO TABS: 25.0000 mg | ORAL_TABLET | Freq: Four times a day (QID) | ORAL | 0 refills | Status: DC | PRN

## 2020-10-05 ENCOUNTER — Ambulatory Visit: Payer: Medicare HMO | Admitting: Hematology and Oncology

## 2020-10-05 ENCOUNTER — Other Ambulatory Visit: Payer: Medicare HMO

## 2020-10-05 ENCOUNTER — Ambulatory Visit: Payer: Medicare HMO

## 2020-10-05 ENCOUNTER — Inpatient Hospital Stay: Payer: Medicare HMO

## 2020-10-05 ENCOUNTER — Other Ambulatory Visit: Payer: Self-pay

## 2020-10-05 DIAGNOSIS — G620 Drug-induced polyneuropathy: Secondary | ICD-10-CM | POA: Diagnosis not present

## 2020-10-05 DIAGNOSIS — Z5112 Encounter for antineoplastic immunotherapy: Secondary | ICD-10-CM | POA: Diagnosis not present

## 2020-10-05 DIAGNOSIS — G629 Polyneuropathy, unspecified: Secondary | ICD-10-CM | POA: Diagnosis not present

## 2020-10-05 DIAGNOSIS — C9000 Multiple myeloma not having achieved remission: Secondary | ICD-10-CM | POA: Diagnosis not present

## 2020-10-05 DIAGNOSIS — R197 Diarrhea, unspecified: Secondary | ICD-10-CM | POA: Diagnosis not present

## 2020-10-05 DIAGNOSIS — T451X5A Adverse effect of antineoplastic and immunosuppressive drugs, initial encounter: Secondary | ICD-10-CM | POA: Diagnosis not present

## 2020-10-05 DIAGNOSIS — R110 Nausea: Secondary | ICD-10-CM | POA: Diagnosis not present

## 2020-10-05 DIAGNOSIS — E538 Deficiency of other specified B group vitamins: Secondary | ICD-10-CM | POA: Diagnosis not present

## 2020-10-05 LAB — MAGNESIUM: Magnesium: 1.4 mg/dL — ABNORMAL LOW (ref 1.7–2.4)

## 2020-10-05 MED ORDER — SODIUM CHLORIDE 0.9 % IV SOLN: 4.0000 g | Freq: Once | INTRAVENOUS | Status: DC

## 2020-10-05 MED ORDER — HEPARIN SOD (PORK) LOCK FLUSH 100 UNIT/ML IV SOLN
500.0000 [IU] | Freq: Once | INTRAVENOUS | Status: AC
  Administered 2020-10-05: 500 [IU] via INTRAVENOUS
  Filled 2020-10-05: qty 5

## 2020-10-05 MED ORDER — SODIUM CHLORIDE 0.9 % IV SOLN
25.0000 mg | Freq: Once | INTRAVENOUS | Status: AC
  Administered 2020-10-05: 25 mg via INTRAVENOUS
  Filled 2020-10-05: qty 1

## 2020-10-05 MED ORDER — MAGNESIUM SULFATE 4 GM/100ML IV SOLN
4.0000 g | Freq: Once | INTRAVENOUS | Status: AC
  Administered 2020-10-05: 4 g via INTRAVENOUS
  Filled 2020-10-05: qty 100

## 2020-10-05 MED ORDER — SODIUM CHLORIDE 0.9 % IV SOLN
INTRAVENOUS | Status: DC
  Filled 2020-10-05: qty 250

## 2020-10-05 NOTE — Progress Notes (Signed)
Mag 1.4 mag 4gms given

## 2020-10-09 ENCOUNTER — Inpatient Hospital Stay: Payer: Medicare HMO

## 2020-10-09 ENCOUNTER — Other Ambulatory Visit: Payer: Self-pay

## 2020-10-09 DIAGNOSIS — C9000 Multiple myeloma not having achieved remission: Secondary | ICD-10-CM

## 2020-10-09 DIAGNOSIS — Z5112 Encounter for antineoplastic immunotherapy: Secondary | ICD-10-CM | POA: Diagnosis not present

## 2020-10-09 DIAGNOSIS — T451X5A Adverse effect of antineoplastic and immunosuppressive drugs, initial encounter: Secondary | ICD-10-CM | POA: Diagnosis not present

## 2020-10-09 DIAGNOSIS — E538 Deficiency of other specified B group vitamins: Secondary | ICD-10-CM | POA: Diagnosis not present

## 2020-10-09 DIAGNOSIS — G620 Drug-induced polyneuropathy: Secondary | ICD-10-CM | POA: Diagnosis not present

## 2020-10-09 DIAGNOSIS — R110 Nausea: Secondary | ICD-10-CM | POA: Diagnosis not present

## 2020-10-09 DIAGNOSIS — R197 Diarrhea, unspecified: Secondary | ICD-10-CM | POA: Diagnosis not present

## 2020-10-09 DIAGNOSIS — G629 Polyneuropathy, unspecified: Secondary | ICD-10-CM | POA: Diagnosis not present

## 2020-10-09 LAB — MAGNESIUM: Magnesium: 1.3 mg/dL — ABNORMAL LOW (ref 1.7–2.4)

## 2020-10-09 MED ORDER — SODIUM CHLORIDE 0.9 % IV SOLN
Freq: Once | INTRAVENOUS | Status: AC
  Filled 2020-10-09: qty 250

## 2020-10-09 MED ORDER — SODIUM CHLORIDE 0.9 % IV SOLN
25.0000 mg | Freq: Once | INTRAVENOUS | Status: AC
  Administered 2020-10-09: 25 mg via INTRAVENOUS
  Filled 2020-10-09: qty 1

## 2020-10-09 MED ORDER — MAGNESIUM SULFATE 4 GM/100ML IV SOLN
4.0000 g | Freq: Once | INTRAVENOUS | Status: AC
  Administered 2020-10-09: 4 g via INTRAVENOUS
  Filled 2020-10-09: qty 100

## 2020-10-09 MED ORDER — HEPARIN SOD (PORK) LOCK FLUSH 100 UNIT/ML IV SOLN
500.0000 [IU] | Freq: Once | INTRAVENOUS | Status: AC | PRN
  Administered 2020-10-09: 500 [IU]
  Filled 2020-10-09: qty 5

## 2020-10-09 MED ORDER — SODIUM CHLORIDE 0.9 % IV SOLN: 6.0000 g | Freq: Once | INTRAVENOUS | Status: DC

## 2020-10-09 MED ORDER — MAGNESIUM SULFATE 2 GM/50ML IV SOLN
2.0000 g | Freq: Once | INTRAVENOUS | Status: AC
Start: 2020-10-09 — End: 2020-10-09
  Administered 2020-10-09: 2 g via INTRAVENOUS
  Filled 2020-10-09: qty 50

## 2020-10-11 NOTE — Progress Notes (Signed)
[facility]  [address] [place], [place] [address] Phone: [phone]  Fax: [phone]   Clinic Day:  10/12/2020   Referring physician: Glean Hess, MD  Chief Complaint: Sarah [name] is a 65 y.o. 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  HPI: The patient was last seen in the medical oncology clinic on 09/14/2020.  At that time, she had been "a little tired." She had not been sleeping well. Her neuropathy, dizziness, and back pain were stable. Her nausea was stable but her appetite was good.  Exam was stable. Hematocrit was 30.3, hemoglobin 10.0, MCV 82.3, platelets 132,000, WBC 4,400. Creatinine was 1.14 (CrCl 54 ml/min). Magnesium was 1.4. She received 4 gm IV magnesium and a vitamin B12 injection. She received week #2 Velcade.   Labs on 09/28/2020 revealed a hematocrit of 30.1, hemoglobin 9.8, MCV 80.3, platelets 142,000, WBC 5,600. Creatinine was 1.18 (CrCl 52 ml/min). Calcium was 8.6. Magnesium was 1.3.  She received week #3 Velcade on 09/28/2020.  Magnesium has ranged from 1.3-1.4 over the past month.  She has received 4 - 6 gm IV magnesium with each visit.  During the interim, she has felt good. She has had some diarrhea since starting Velcade. She has been using Lomotil, which helps. She would like to send off a stool sample. Her neuropathy is stable. Her appetite is great but she still gets nauseous after she eats. She reports she "cannot get full."  She is in the process of moving. When she lifts too many boxes, she gets a bit short of breath. Her knee is still a bit sore s/p fall in 08/2020.   Past Medical History:  Diagnosis Date  . Abnormal stress test 02/14/2016   Overview:  Added automatically from request for surgery [id number]  . Anemia   . Anxiety   . Arthritis   . Bicuspid aortic valve   . CHF (congestive heart failure) ([place])   .  CKD (chronic kidney disease) stage 3, GFR 30-59 ml/min (HCC)   . Depression   . Diabetes mellitus ([place])   . Dizziness   . Fatty liver   . Frequent falls   . GERD (gastroesophageal reflux disease)   . Gout   . Heart murmur   . History of blood transfusion   . History of bone marrow transplant ([place])   . History of uterine fibroid   . Hx of cardiac catheterization 06/05/2016   Overview:  Normal coronaries 2017  . Hypertension   . Hypomagnesemia   . Multiple myeloma ([place])   . Personal history of chemotherapy   . Renal cyst     Past Surgical History:  Procedure Laterality Date  . ABDOMINAL HYSTERECTOMY    . 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 CARDIAC ELECTROPHYSIOLOGY MAPPING AND ABLATION    . CARPAL TUNNEL RELEASE Bilateral   . CHOLECYSTECTOMY  2008  . COLONOSCOPY WITH PROPOFOL N/A 05/07/2017   Procedure: COLONOSCOPY WITH PROPOFOL;  Surgeon: [name], MD;  Location: [facility] ENDOSCOPY;  Service: Gastroenterology;  Laterality: N/A;  . ESOPHAGOGASTRODUODENOSCOPY (EGD) WITH PROPOFOL N/A 05/07/2017   Procedure: ESOPHAGOGASTRODUODENOSCOPY (EGD) WITH PROPOFOL;  Surgeon: [name], MD;  Location: [facility] ENDOSCOPY;  Service: Gastroenterology;  Laterality: N/A;  . FOOT SURGERY Bilateral   . INCONTINENCE SURGERY  2009  . INTERSTIM IMPLANT PLACEMENT    . other     over active bladder  .  OTHER SURGICAL HISTORY     bladder stimulator   . PARTIAL HYSTERECTOMY  03/1996   fibroids  . PORTA CATH INSERTION N/A 03/10/2019   Procedure: PORTA CATH INSERTION;  Surgeon: Algernon Huxley, MD;  Location: [place] CV LAB;  Service: Cardiovascular;  Laterality: N/A;  . TONSILLECTOMY  2007    Family History  Problem Relation Age of Onset  . Colon cancer Father   . Renal Disease Father   . Diabetes Mellitus II Father   . Melanoma Paternal Grandmother   . Breast cancer Maternal Aunt 44  . Anemia Mother   . Heart disease Mother   . Heart failure Mother   . Renal Disease Mother   . Congestive Heart  Failure Mother   . Heart disease Maternal Uncle   . Throat cancer Maternal Uncle   . Lung cancer Maternal Uncle   . Liver disease Maternal Uncle   . Heart failure Maternal Uncle   . Hearing loss Son 76       Suicide     Social History:  reports that she quit smoking about 29 years ago. Her smoking use included cigarettes. She has a 20.00 pack-year smoking history. She has never used smokeless tobacco. She reports current alcohol use. She reports that she does not use drugs. Patient has not had ETOH in several months. She is on disability. She notes exposure to perchloroethylene [facility]).She lives much of her adult life in [place]. She was married with 2 sons. Her husband passed away. Her 2 sons took their own lives (age 78 and 16). She worked in [employer] in [employer]. She went back to school and earned an [employer] and works for [employer] for several years.[place] lives[place] sistersin [place]. She has two dogs, Harley and Porter. The patient is alone today.   Allergies:  Allergies  Allergen Reactions  . Oxycodone-Acetaminophen Anaphylaxis    Swelling and rash  . Celebrex [Celecoxib] Diarrhea  . Codeine   . Plerixafor     In 2016 during ASCT collection patient developed fever to 103.25F and required hospitalization  . Benadryl [Diphenhydramine] Palpitations  . Morphine Itching and Rash  . Ondansetron Diarrhea  . Tylenol [Acetaminophen] Itching and Rash    Current Medications: Current Outpatient Medications  Medication Sig Dispense Refill  . acyclovir (ZOVIRAX) 200 MG capsule Take by mouth.    Marland Kitchen allopurinol (ZYLOPRIM) 100 MG tablet TAKE 1 TABLET(100 MG) BY MOUTH DAILY as needed 90 tablet 1  . bisoprolol (ZEBETA) 5 MG tablet Take 5 mg by mouth daily.    . diclofenac sodium (VOLTAREN) 1 % GEL Apply 2 g topically 4 (four) times daily. 100 g 1  . diphenhydrAMINE (BENADRYL) 50 MG capsule Take 50 mg by mouth as needed.    . fexofenadine (ALLEGRA) 180 MG tablet  TAKE 1 TABLET(180 MG) BY MOUTH DAILY 30 tablet 5  . FLUoxetine (PROZAC) 40 MG capsule TAKE 1 CAPSULE EVERY DAY 90 capsule 1  . fluticasone (FLONASE) 50 MCG/ACT nasal spray Place 2 sprays into both nostrils daily. 16 g 2  . glucose blood test strip     . lidocaine (LIDODERM) 5 % 1 patch daily.    Marland Kitchen loperamide (IMODIUM) 2 MG capsule Take 1 capsule (2 mg total) by mouth 4 (four) times daily as needed for diarrhea or loose stools. 30 capsule 1  . methocarbamol (ROBAXIN) 500 MG tablet TAKE 1 TABLET FOUR TIMES DAILY. 120 tablet 0  . Multiple Vitamin (MULTIVITAMIN) tablet Take 1  tablet by mouth daily.    . Multiple Vitamins-Minerals (HAIR/SKIN/NAILS) TABS Take 1 tablet by mouth daily.    Marland Kitchen omeprazole (PRILOSEC) 40 MG capsule TAKE 1 CAPSULE EVERY DAY 90 capsule 0  . promethazine (PHENERGAN) 25 MG tablet Take 1 tablet (25 mg total) by mouth every 6 (six) hours as needed for nausea or vomiting. 90 tablet 0  . traZODone (DESYREL) 100 MG tablet Take 1 tablet (100 mg total) by mouth at bedtime. 90 tablet 1  . acetaminophen (TYLENOL) 500 MG tablet Take 500 mg by mouth every 6 (six) hours as needed.  (Patient not taking: No sig reported)    . lactulose (CHRONULAC) 10 GM/15ML solution Take 20 g by mouth daily as needed.  (Patient not taking: No sig reported)    . metFORMIN (GLUCOPHAGE-XR) 500 MG 24 hr tablet TAKE 1 TABLET EVERY DAY WITH BREAKFAST (Patient not taking: Reported on 10/12/2020) 90 tablet 1   No current facility-administered medications for this visit.   Facility-Administered Medications Ordered in Other Visits  Medication Dose Route Frequency Provider Last Rate Last Admin  . 0.9 %  sodium chloride infusion   Intravenous Continuous Lequita Asal, MD   Stopped at 01/20/20 1232  . heparin lock flush 100 unit/mL  500 Units Intravenous Once Faythe Casa E, NP      . sodium chloride flush (NS) 0.9 % injection 10 mL  10 mL Intravenous PRN Lequita Asal, MD   10 mL at 02/07/20 0815  .  sodium chloride flush (NS) 0.9 % injection 10 mL  10 mL Intracatheter PRN Cammie Sickle, MD   10 mL at 02/10/20 0815  . sodium chloride flush (NS) 0.9 % injection 10 mL  10 mL Intravenous PRN Jacquelin Hawking, NP   10 mL at 03/01/20 1143    Review of Systems  Constitutional: Negative for chills, diaphoresis, fever, malaise/fatigue and weight loss (up 4 lbs).  HENT: Negative.  Negative for congestion, ear discharge, ear pain, hearing loss, nosebleeds, sinus pain, sore throat and tinnitus.   Eyes: Negative.  Negative for blurred vision.  Respiratory: Positive for shortness of breath (on exertion). Negative for cough, hemoptysis, sputum production and wheezing.   Cardiovascular: Negative.  Negative for chest pain, palpitations and leg swelling.  Gastrointestinal: Positive for diarrhea and nausea (chronic, stable). Negative for abdominal pain, blood in stool, constipation, heartburn (on Prilosec), melena and vomiting.       Good appetite.  Genitourinary: Negative.  Negative for dysuria, frequency and urgency.  Musculoskeletal: Positive for back pain (chronic, 5/10) and joint pain (right knee). Negative for myalgias and neck pain.  Skin: Negative.  Negative for itching and rash.  Neurological: Positive for sensory change (chronic numbness in toes and from fingertips to halfway up forearm, stable). Negative for dizziness, tingling, weakness and headaches.  Endo/Heme/Allergies: Negative for environmental allergies. Does not bruise/bleed easily.       Diabetes, well controlled.  Psychiatric/Behavioral: Negative for depression and memory loss. The patient is not nervous/anxious and does not have insomnia.   All other systems reviewed and are negative.   Performance status (ECOG): 1  Vitals Blood pressure 121/77, pulse 81, temperature (!) 96.7 F (35.9 C), temperature source Tympanic, resp. rate 16, height 5' 2.5" (1.588 m), weight 158 lb (71.7 kg), SpO2 99 %.  Physical Exam Vitals and  nursing note reviewed.  Constitutional:      General: She is not in acute distress.    Appearance: She is well-developed. She is not  ill-appearing, toxic-appearing or diaphoretic.     Interventions: Face mask in place.  HENT:     Head: Normocephalic and atraumatic.     Comments: Short gray hair.    Mouth/Throat:     Mouth: Mucous membranes are moist.     Pharynx: Oropharynx is clear.  Eyes:     General: No scleral icterus.    Extraocular Movements: Extraocular movements intact.     Conjunctiva/sclera: Conjunctivae normal.     Pupils: Pupils are equal, round, and reactive to light.     Comments: Glasses. Brown eyes.   Cardiovascular:     Rate and Rhythm: Normal rate and regular rhythm.     Heart sounds: Normal heart sounds. No murmur heard.   Pulmonary:     Effort: Pulmonary effort is normal. No respiratory distress.     Breath sounds: Normal breath sounds. No wheezing or rales.  Chest:     Chest wall: No tenderness.  Breasts:     Right: No supraclavicular adenopathy.     Left: No supraclavicular adenopathy.    Abdominal:     General: Bowel sounds are normal. There is no distension.     Palpations: Abdomen is soft. There is no hepatomegaly, splenomegaly or mass.     Tenderness: There is no abdominal tenderness. There is no guarding or rebound.  Musculoskeletal:        General: Swelling (right knee, subtle fullness) and tenderness (BLE, medial right knee) present. Normal range of motion.     Cervical back: Normal range of motion and neck supple.     Right lower leg: No edema.     Left lower leg: No edema.  Lymphadenopathy:     Head:     Right side of head: No preauricular, posterior auricular or occipital adenopathy.     Left side of head: No preauricular, posterior auricular or occipital adenopathy.     Cervical: No cervical adenopathy.     Upper Body:     Right upper body: No supraclavicular adenopathy.     Left upper body: No supraclavicular adenopathy.  Skin:     General: Skin is warm and dry.     Coloration: Skin is not jaundiced or pale.     Findings: No erythema.  Neurological:     Mental Status: She is alert and oriented to person, place, and time. Mental status is at baseline.  Psychiatric:        Behavior: Behavior normal.        Thought Content: Thought content normal.        Judgment: Judgment normal.    Infusion on 10/12/2020  Component Date Value Ref Range Status  . Magnesium 10/12/2020 1.3* 1.7 - 2.4 mg/dL Final   Performed at [facility], [address] [place], [place] [address]  . Sodium 10/12/2020 137  135 - 145 mmol/L Final  . Potassium 10/12/2020 4.4  3.5 - 5.1 mmol/L Final  . Chloride 10/12/2020 105  98 - 111 mmol/L Final  . CO2 10/12/2020 23  22 - 32 mmol/L Final  . Glucose, Bld 10/12/2020 154* 70 - 99 mg/dL Final   Glucose reference range applies only to samples taken after fasting for at least 8 hours.  . BUN 10/12/2020 27* 8 - 23 mg/dL Final  . Creatinine, Ser 10/12/2020 1.23* 0.44 - 1.00 mg/dL Final  . Calcium 10/12/2020 9.4  8.9 - 10.3 mg/dL Final  . Total Protein 10/12/2020 6.9  6.5 - 8.1 g/dL Final  .  Albumin 10/12/2020 4.3  3.5 - 5.0 g/dL Final  . AST 10/12/2020 34  15 - 41 U/L Final  . ALT 10/12/2020 41  0 - 44 U/L Final  . Alkaline Phosphatase 10/12/2020 87  38 - 126 U/L Final  . Total Bilirubin 10/12/2020 0.6  0.3 - 1.2 mg/dL Final  . GFR, Estimated 10/12/2020 49* >60 mL/min Final   Comment: (NOTE) Calculated using the CKD-EPI Creatinine Equation (2021)   . Anion gap 10/12/2020 9  5 - 15 Final   Performed at [facility], [address] [place], [place] [address]  . WBC 10/12/2020 5.0  4.0 - 10.5 K/uL Final  . RBC 10/12/2020 3.92  3.87 - 5.11 MIL/uL Final  . Hemoglobin 10/12/2020 9.9* 12.0 - 15.0 g/dL Final  . HCT 10/12/2020 31.2* 36.0 - 46.0 % Final  . MCV 10/12/2020 79.6* 80.0 - 100.0 fL Final  . MCH 10/12/2020 25.3* 26.0 - 34.0 pg Final  . MCHC 10/12/2020 31.7  30.0 - 36.0  g/dL Final  . RDW 10/12/2020 13.1  11.5 - 15.5 % Final  . Platelets 10/12/2020 156  150 - 400 K/uL Final  . nRBC 10/12/2020 0.0  0.0 - 0.2 % Final  . Neutrophils Relative % 10/12/2020 64  % Final  . Neutro Abs 10/12/2020 3.1  1.7 - 7.7 K/uL Final  . Lymphocytes Relative 10/12/2020 23  % Final  . Lymphs Abs 10/12/2020 1.2  0.7 - 4.0 K/uL Final  . Monocytes Relative 10/12/2020 11  % Final  . Monocytes Absolute 10/12/2020 0.5  0.1 - 1.0 K/uL Final  . Eosinophils Relative 10/12/2020 1  % Final  . Eosinophils Absolute 10/12/2020 0.1  0.0 - 0.5 K/uL Final  . Basophils Relative 10/12/2020 0  % Final  . Basophils Absolute 10/12/2020 0.0  0.0 - 0.1 K/uL Final  . Immature Granulocytes 10/12/2020 1  % Final  . Abs Immature Granulocytes 10/12/2020 0.05  0.00 - 0.07 K/uL Final   Performed at [facility], [address], [place], [place] [address]    Assessment:  Sarah [name] is a 65 y.o. 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  Initial bone marrow revealed 80% plasma cells. Lambda free light chains were 1340. She had nephrotic range proteinuria. She initially underwent induction with RVD. Revlimid maintenance was discontinued on 01/21/2017 secondary to intolerance.   Bone marrowaspirate andbiopsyon 01/18/2021revealed anormocellularmarrow withbut increased lambda-restricted plasma cells (9% aspirate, 40% CD138 immunohistochemistry).Findingswereconsistent with recurrent plasma cell myeloma.Flow cytometry revealed no monoclonal B-cell or phenotypically aberrant T-cell population. Cytogeneticswere 68, XX (normal). 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  M-spikehas been followed: 0 on 04/02/2016 -06/23/2019; 0.2 on 09/02/2017, 0.1 on 11/25/2019, 0.1 on 01/10/2020, 0.2 on 02/07/2020, 0.1  on 03/07/2020, 0 on 04/10/2020, and 06/19/2020.  Lambda light chainshave been followed: 22.2 (ratio 0.56) on 07/03/2017, 30.8 (ratio 0.78) on 09/02/2017, 36.9 (ratio 0.40) on 10/21/2017, 37.4 (ratio 0.41) on 12/16/2017, 70.7(ratio 0.31) on 02/17/2018, 64.2 (ratio 0.27) on 04/07/2018, 78.9 (ratio 0.18) on 05/26/2018, 128.8 (ratio 0.17) on 08/06/2018, 181.5 (ratio 0.13) on 10/08/2018, 130.9 (ratio 0.13) on 10/20/2018, 160.7 (ratio 0.10)on 12/09/2018, 236.6 (ratio 0.07) on 02/01/2019, 363.6 (ratio 0.04) on 03/22/2019, 404.8 (ratio 0.04) on 04/05/2019, 420.7 (ratio 0.03) on 05/24/2019, 573.4 (ratio 0.03) on 06/23/2019, 451.05(ratio 0.02) on02/19/2021, 47.2 (ratio 0.13) on 10/25/2019, 22.4 (ratio 0.21) on 11/25/2019,  16.5 (ratio 0.33) on 01/10/2020, 14.6 (ratio 0.34) on 02/07/2020, 13.1 (ratio 0.31) on 03/07/2020, 10.1 (ratio 0.38) on 04/10/2020, and 9.5 (ratio 0.21) on 06/19/2020.  24 hour UPEPon 06/03/2019 revealed kappa free light chains95.76,lambda free light chains1,260.71, andratio 0.08.24 hourUPEPon 02/22/2021revealed totalproteinof782mg /24 hrs withlambdafree light chains 1,084.16mg /L andratioof0.10(1.03-31.76). M spike inurinewas46.1%(361mg /24 hrs).  Bone surveyon 04/08/2016 and11/28/2018 revealed no definite lytic lesion seen in the visualized skeleton.Bone surveyon 11/19/2018 revealed no suspicious lucent lesionsand no acute bony abnormality.PET scanon 07/12/2019 revealed no focal metabolic activity to suggest active myeloma within the skeleton. There wereno lytic lesions identified on the CT portion of the examorsoft tissue plasmacytomas. There was no evidence of multiple myeloma.  Pretreatment RBC phenotypeon 09/23/2019 waspositivefor C, e, DUFFY B, KIDD B, M, S, and s antigen; negativefor c, E, KELL, DUFFY A, KIDD A, and N antigen.  Shereceived 6 cycles of daratumumab and hyaluronidase-fihj, Pomalyst, and Decadron (DPd)(09/27/2019 -  10/25/2019; 12/09/2019 - 03/13/2020). Cycle #1 was complicated by fever and neutropenia requiring admission.  Cycle #2 was complicated by pneumonia requiring admission.  Cycle #6 was complicated with an ER evaluation for an elevated lactic acid.  She is day 127 s/p second autologous stem cell transplant at [facility] on 05/10/2020.  She underwent conditioning with melphalan 140 mg/m2.    She is s/p week #3 Velcade maintenance (began 08/31/2020 - 09/28/2020).  She has no increase in neuropathy.  She has severe aortic stenosis.  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 Cardiology is following for a possible TAVR in the future.  Echo on 07/05/2020 revealed moderate to severe aortic stenosis with an EF of 50-55%.  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 Zometa was discontinued in 01/2017. She has chronic nauseaon Phenergan.  She has B12 deficiency. B12 was 254 on 04/09/2017, 295 on 08/20/2018, and 391 on 10/08/2018. Shewasonoral B12.She received B12 monthly (last03/17/2022).Folate was12.1on 01/10/2020.  She has iron deficiency. Ferritin was 32 on 07/01/2019. She received Venoferon 07/15/2019 and 07/22/2019.  She has hypogammaglobulinemia.  IgG was 245 on 11/25/2019.  She received monthly IVIG (07/22/021 - 03/22/2020).  She received IVIG 400 mg/kg on 01/20/2020, 200 mg/kg on 02/17/2020, and 300 mg/kg on 03/22/2020.  IVIG on 49/67/5916 was complicated by acute renal failure. IgG trough level was 418 on 02/17/2020.  She was admitted to [place] 10/15/2019 - 10/20/2019 with fever and neutropenia. Cultures were negative. CXR was negative. She received broad spectrum antibiotics and daily Granix. She received IVF for acute renal insufficiency due to diarrhea and dehydration. Creatine was 1.66 on admission and 1.12 on discharge.  She was admitted to [facility] from 03/01/2020 - 03/05/2020  with fever and neutropenia. CXR revealed no active cardiopulmonary disease. Chest CT with contrast revealed no acute intrathoracic pathology. There were findings which could be suggestive of prior granulomatous disease. She was treated with Cefepime and Vancomycin, then switched to ciprofloxacin on 03/03/2020.   The patient was admitted to [facility] from 05/08/2020 - 06/04/2020 for autologous bone marrow transplant.   She received conditioning with melphalan 140 mg/m2.  Bone marrow transplant was on 05/10/2020. The patient developed fevers daily from 05/19/2020 - 05/24/2020. Blood cultures were + for strept sanguis bacteremia.  She was treated cefepime, vancomycin, and solumedrol for possible engraftment syndrome. Cefepime was switched to ceftriaxone; she completed 2 weeks of ceftriaxone on 06/03/2020. She had chemotherapy induced diarrhea.  She experienced urinary retention.  She received the [place] 09/20/2019 and 10/09/2019.  She received the Booster on 08/10/2020. She received the tetanus shot on 08/20/2020. She received the shingles vaccine recently.  Symptomatically, she feels good. She has had some diarrhea since starting Velcade.  Her neuropathy is stable. Her appetite is great.  She has chronic nausea.  Exam is stable.  Plan: 1.   Labs today: CBC with diff, CMP, Mg, SPEP, FLCA 2. Stage III multiple myeloma  Symptomatically, she is doing well.   Neuropathy is stable.   Exam is unremarkable.  Lambda free light chains have improved from451.05on02/19/2021. After cycle #1: 47.2 (ratio 0.13). After cycle #6:10.1 (ratio 0.38).   After transplant: 9.5 (ratio 0.21).  She is day 155 s/p stem cell transplant #2 (05/10/2020).  Infection prophylaxis   Valtrex through 1 year s/p transplant.  Vaccination   Anticipate initiation at [facility] after day 60.  Post transplant RN coordinator [phone]).  Labs  reviewed.  Week #4 Velcade.  Discuss symptom management.  She has antiemetics at home to use on a prn bases.  Interventions are adequate. 3.   Microcytic anemia  Hematocrit 31.2.  Hemoglobin 9.9.  MCV 79.6 today.  Etiology likely secondary to iron deficiency.   Check ferritin and iron studies today. 4. Grade II peripheral neuropathy  Neuropathy is slightly worse s/p 2nd transplant.  Neuropathy remains table after 3 weeks of Velcade.  Continue to monitor. 5.   Chronic hypomagnesemia Magnesium1.3 today.  Magnesium 6 gm IV today.  Continue magnesium checks twice weekly (Mondays and Thursdays). 6.   Chronic nausea  Continue Phenergan IV when she is in clinic.  Patient has Phenergan p.o. to use at home. 7.   B12 deficiency  Patient receives B12 monthly (last 09/14/2020).  B12 today and monthly x 6. 8.   Diarrhea  Etiology likely secondary to Velcade (incidence 19-52%).  Check stool for C diff and GI by PCR. 9.   Phenergan IV today. 10.   Magnesium 6 gm IV. 11.   Week #4 Velcade. 12.   RTC on Monday and Thursdays x 3 weeks for labs (Mg) and +/- IV Mg and Phenergan. 13.   RTC in 2 weeks for labs (CBC with diff, CMP, Mg), week #5 Velcade and IV Mg. 14.   RTC in 4 weeks for MD assessment, labs (CBC with diff, CMP, Mg, SPEP, FLCA), B12, and week #6 Velcade.  I discussed the assessment and treatment plan with the patient.  The patient was provided an opportunity to ask questions and all were answered.  The patient agreed with the plan and demonstrated an understanding of the instructions.  The patient was advised to call back if the symptoms worsen or if the condition fails to improve as anticipated.    Lequita Asal, MD, PhD    10/12/2020, 9:25 AM   I, Mirian Mo Tufford, am acting as Education administrator for [employer]. Mike Gip, MD, PhD.  I, Larsen Zettel C. Mike Gip, MD, have reviewed the above documentation for accuracy and completeness, and I agree with the above.

## 2020-10-12 ENCOUNTER — Other Ambulatory Visit: Payer: Self-pay

## 2020-10-12 ENCOUNTER — Encounter: Payer: Self-pay | Admitting: Hematology and Oncology

## 2020-10-12 ENCOUNTER — Inpatient Hospital Stay: Payer: Medicare HMO

## 2020-10-12 ENCOUNTER — Inpatient Hospital Stay (HOSPITAL_BASED_OUTPATIENT_CLINIC_OR_DEPARTMENT_OTHER): Payer: Medicare HMO | Admitting: Hematology and Oncology

## 2020-10-12 VITALS — BP 121/77 | HR 81 | Temp 96.7°F | Resp 16 | Ht 62.5 in | Wt 158.0 lb

## 2020-10-12 DIAGNOSIS — G620 Drug-induced polyneuropathy: Secondary | ICD-10-CM

## 2020-10-12 DIAGNOSIS — E538 Deficiency of other specified B group vitamins: Secondary | ICD-10-CM

## 2020-10-12 DIAGNOSIS — Z5111 Encounter for antineoplastic chemotherapy: Secondary | ICD-10-CM | POA: Diagnosis not present

## 2020-10-12 DIAGNOSIS — D649 Anemia, unspecified: Secondary | ICD-10-CM

## 2020-10-12 DIAGNOSIS — Z5112 Encounter for antineoplastic immunotherapy: Secondary | ICD-10-CM | POA: Diagnosis not present

## 2020-10-12 DIAGNOSIS — T451X5A Adverse effect of antineoplastic and immunosuppressive drugs, initial encounter: Secondary | ICD-10-CM | POA: Diagnosis not present

## 2020-10-12 DIAGNOSIS — G629 Polyneuropathy, unspecified: Secondary | ICD-10-CM | POA: Diagnosis not present

## 2020-10-12 DIAGNOSIS — R197 Diarrhea, unspecified: Secondary | ICD-10-CM | POA: Diagnosis not present

## 2020-10-12 DIAGNOSIS — R110 Nausea: Secondary | ICD-10-CM | POA: Diagnosis not present

## 2020-10-12 DIAGNOSIS — C9000 Multiple myeloma not having achieved remission: Secondary | ICD-10-CM

## 2020-10-12 LAB — COMPREHENSIVE METABOLIC PANEL
ALT: 41 U/L (ref 0–44)
AST: 34 U/L (ref 15–41)
Albumin: 4.3 g/dL (ref 3.5–5.0)
Alkaline Phosphatase: 87 U/L (ref 38–126)
Anion gap: 9 (ref 5–15)
BUN: 27 mg/dL — ABNORMAL HIGH (ref 8–23)
CO2: 23 mmol/L (ref 22–32)
Calcium: 9.4 mg/dL (ref 8.9–10.3)
Chloride: 105 mmol/L (ref 98–111)
Creatinine, Ser: 1.23 mg/dL — ABNORMAL HIGH (ref 0.44–1.00)
GFR, Estimated: 49 mL/min — ABNORMAL LOW (ref >60)
Glucose, Bld: 154 mg/dL — ABNORMAL HIGH (ref 70–99)
Potassium: 4.4 mmol/L (ref 3.5–5.1)
Sodium: 137 mmol/L (ref 135–145)
Total Bilirubin: 0.6 mg/dL (ref 0.3–1.2)
Total Protein: 6.9 g/dL (ref 6.5–8.1)

## 2020-10-12 LAB — CBC WITH DIFFERENTIAL/PLATELET
Abs Immature Granulocytes: 0.05 10*3/uL (ref 0.00–0.07)
Basophils Absolute: 0 10*3/uL (ref 0.0–0.1)
Basophils Relative: 0 %
Eosinophils Absolute: 0.1 10*3/uL (ref 0.0–0.5)
Eosinophils Relative: 1 %
HCT: 31.2 % — ABNORMAL LOW (ref 36.0–46.0)
Hemoglobin: 9.9 g/dL — ABNORMAL LOW (ref 12.0–15.0)
Immature Granulocytes: 1 %
Lymphocytes Relative: 23 %
Lymphs Abs: 1.2 10*3/uL (ref 0.7–4.0)
MCH: 25.3 pg — ABNORMAL LOW (ref 26.0–34.0)
MCHC: 31.7 g/dL (ref 30.0–36.0)
MCV: 79.6 fL — ABNORMAL LOW (ref 80.0–100.0)
Monocytes Absolute: 0.5 10*3/uL (ref 0.1–1.0)
Monocytes Relative: 11 %
Neutro Abs: 3.1 10*3/uL (ref 1.7–7.7)
Neutrophils Relative %: 64 %
Platelets: 156 10*3/uL (ref 150–400)
RBC: 3.92 MIL/uL (ref 3.87–5.11)
RDW: 13.1 % (ref 11.5–15.5)
WBC: 5 10*3/uL (ref 4.0–10.5)
nRBC: 0 % (ref 0.0–0.2)

## 2020-10-12 LAB — IRON AND TIBC
Iron: 36 ug/dL (ref 28–170)
Saturation Ratios: 7 % — ABNORMAL LOW (ref 10.4–31.8)
TIBC: 484 ug/dL — ABNORMAL HIGH (ref 250–450)
UIBC: 448 ug/dL

## 2020-10-12 LAB — FERRITIN: Ferritin: 7 ng/mL — ABNORMAL LOW (ref 11–307)

## 2020-10-12 LAB — MAGNESIUM: Magnesium: 1.3 mg/dL — ABNORMAL LOW (ref 1.7–2.4)

## 2020-10-12 MED ORDER — SODIUM CHLORIDE 0.9 % IV SOLN
25.0000 mg | Freq: Once | INTRAVENOUS | Status: AC
  Administered 2020-10-12: 25 mg via INTRAVENOUS
  Filled 2020-10-12: qty 1

## 2020-10-12 MED ORDER — BORTEZOMIB CHEMO IV INJECTION 3.5 MG (1MG/ML-GENERIC)
2.0000 mg | Freq: Once | INTRAVENOUS | Status: AC
  Administered 2020-10-12: 2 mg via INTRAVENOUS
  Filled 2020-10-12: qty 2

## 2020-10-12 MED ORDER — CYANOCOBALAMIN 1000 MCG/ML IJ SOLN
1000.0000 ug | Freq: Once | INTRAMUSCULAR | Status: AC
  Administered 2020-10-12: 1000 ug via INTRAMUSCULAR

## 2020-10-12 MED ORDER — MAGNESIUM SULFATE 2 GM/50ML IV SOLN
2.0000 g | Freq: Once | INTRAVENOUS | Status: AC
  Administered 2020-10-12: 2 g via INTRAVENOUS
  Filled 2020-10-12: qty 50

## 2020-10-12 MED ORDER — MAGNESIUM SULFATE 4 GM/100ML IV SOLN
4.0000 g | Freq: Once | INTRAVENOUS | Status: AC
  Administered 2020-10-12: 4 g via INTRAVENOUS
  Filled 2020-10-12: qty 100

## 2020-10-12 MED ORDER — SODIUM CHLORIDE 0.9 % IV SOLN
6.0000 g | Freq: Once | INTRAVENOUS | Status: DC
  Filled 2020-10-12: qty 12

## 2020-10-12 NOTE — Progress Notes (Signed)
Has had some nausea and diarrhea since starting back on the velcade. Wants to have her R knee looked at. Thinks there might be fluid on it. Had a fall in Feb and injured that knee.

## 2020-10-13 LAB — PROTEIN ELECTROPHORESIS, SERUM
A/G Ratio: 2 — ABNORMAL HIGH (ref 0.7–1.7)
Albumin ELP: 4.2 g/dL (ref 2.9–4.4)
Alpha-1-Globulin: 0.2 g/dL (ref 0.0–0.4)
Alpha-2-Globulin: 0.9 g/dL (ref 0.4–1.0)
Beta Globulin: 0.9 g/dL (ref 0.7–1.3)
Gamma Globulin: 0.2 g/dL — ABNORMAL LOW (ref 0.4–1.8)
Globulin, Total: 2.1 g/dL — ABNORMAL LOW (ref 2.2–3.9)
Total Protein ELP: 6.3 g/dL (ref 6.0–8.5)

## 2020-10-13 LAB — KAPPA/LAMBDA LIGHT CHAINS
Kappa free light chain: 4.3 mg/L (ref 3.3–19.4)
Kappa, lambda light chain ratio: 0.66 (ref 0.26–1.65)
Lambda free light chains: 6.5 mg/L (ref 5.7–26.3)

## 2020-10-16 ENCOUNTER — Other Ambulatory Visit: Payer: Self-pay

## 2020-10-16 ENCOUNTER — Inpatient Hospital Stay: Payer: Medicare HMO

## 2020-10-16 VITALS — BP 120/76 | HR 80 | Temp 97.5°F | Resp 17

## 2020-10-16 DIAGNOSIS — E538 Deficiency of other specified B group vitamins: Secondary | ICD-10-CM | POA: Diagnosis not present

## 2020-10-16 DIAGNOSIS — C9000 Multiple myeloma not having achieved remission: Secondary | ICD-10-CM

## 2020-10-16 DIAGNOSIS — G629 Polyneuropathy, unspecified: Secondary | ICD-10-CM | POA: Diagnosis not present

## 2020-10-16 DIAGNOSIS — R197 Diarrhea, unspecified: Secondary | ICD-10-CM

## 2020-10-16 DIAGNOSIS — Z5112 Encounter for antineoplastic immunotherapy: Secondary | ICD-10-CM | POA: Diagnosis not present

## 2020-10-16 DIAGNOSIS — Z5111 Encounter for antineoplastic chemotherapy: Secondary | ICD-10-CM

## 2020-10-16 DIAGNOSIS — R110 Nausea: Secondary | ICD-10-CM | POA: Diagnosis not present

## 2020-10-16 DIAGNOSIS — G620 Drug-induced polyneuropathy: Secondary | ICD-10-CM | POA: Diagnosis not present

## 2020-10-16 DIAGNOSIS — T451X5A Adverse effect of antineoplastic and immunosuppressive drugs, initial encounter: Secondary | ICD-10-CM | POA: Diagnosis not present

## 2020-10-16 LAB — GASTROINTESTINAL PANEL BY PCR, STOOL (REPLACES STOOL CULTURE)

## 2020-10-16 LAB — CBC WITH DIFFERENTIAL/PLATELET
Abs Immature Granulocytes: 0.03 10*3/uL (ref 0.00–0.07)
Basophils Absolute: 0 10*3/uL (ref 0.0–0.1)
Basophils Relative: 0 %
Eosinophils Absolute: 0 10*3/uL (ref 0.0–0.5)
Eosinophils Relative: 1 %
HCT: 31.2 % — ABNORMAL LOW (ref 36.0–46.0)
Hemoglobin: 10.3 g/dL — ABNORMAL LOW (ref 12.0–15.0)
Immature Granulocytes: 1 %
Lymphocytes Relative: 17 %
Lymphs Abs: 1 10*3/uL (ref 0.7–4.0)
MCH: 26.1 pg (ref 26.0–34.0)
MCHC: 33 g/dL (ref 30.0–36.0)
MCV: 79.2 fL — ABNORMAL LOW (ref 80.0–100.0)
Monocytes Absolute: 0.6 10*3/uL (ref 0.1–1.0)
Monocytes Relative: 9 %
Neutro Abs: 4.5 10*3/uL (ref 1.7–7.7)
Neutrophils Relative %: 72 %
Platelets: 153 10*3/uL (ref 150–400)
RBC: 3.94 MIL/uL (ref 3.87–5.11)
RDW: 13 % (ref 11.5–15.5)
WBC: 6.1 10*3/uL (ref 4.0–10.5)
nRBC: 0 % (ref 0.0–0.2)

## 2020-10-16 LAB — COMPREHENSIVE METABOLIC PANEL
ALT: 48 U/L — ABNORMAL HIGH (ref 0–44)
AST: 43 U/L — ABNORMAL HIGH (ref 15–41)
Albumin: 4.6 g/dL (ref 3.5–5.0)
Alkaline Phosphatase: 99 U/L (ref 38–126)
Anion gap: 10 (ref 5–15)
BUN: 21 mg/dL (ref 8–23)
CO2: 22 mmol/L (ref 22–32)
Calcium: 9 mg/dL (ref 8.9–10.3)
Chloride: 105 mmol/L (ref 98–111)
Creatinine, Ser: 1.15 mg/dL — ABNORMAL HIGH (ref 0.44–1.00)
GFR, Estimated: 53 mL/min — ABNORMAL LOW (ref >60)
Glucose, Bld: 171 mg/dL — ABNORMAL HIGH (ref 70–99)
Potassium: 4 mmol/L (ref 3.5–5.1)
Sodium: 137 mmol/L (ref 135–145)
Total Bilirubin: 0.5 mg/dL (ref 0.3–1.2)
Total Protein: 7 g/dL (ref 6.5–8.1)

## 2020-10-16 LAB — C DIFFICILE QUICK SCREEN W PCR REFLEX
C Diff antigen: NEGATIVE
C Diff interpretation: NOT DETECTED
C Diff toxin: NEGATIVE

## 2020-10-16 LAB — MAGNESIUM: Magnesium: 1.4 mg/dL — ABNORMAL LOW (ref 1.7–2.4)

## 2020-10-16 MED ORDER — SODIUM CHLORIDE 0.9 % IV SOLN: 4.0000 g | Freq: Once | INTRAVENOUS | Status: DC

## 2020-10-16 MED ORDER — MAGNESIUM SULFATE 4 GM/100ML IV SOLN
4.0000 g | Freq: Once | INTRAVENOUS | Status: AC
  Administered 2020-10-16: 4 g via INTRAVENOUS
  Filled 2020-10-16: qty 100

## 2020-10-16 MED ORDER — HEPARIN SOD (PORK) LOCK FLUSH 100 UNIT/ML IV SOLN
500.0000 [IU] | Freq: Once | INTRAVENOUS | Status: AC
  Administered 2020-10-16: 500 [IU] via INTRAVENOUS
  Filled 2020-10-16: qty 5

## 2020-10-16 MED ORDER — HEPARIN SOD (PORK) LOCK FLUSH 100 UNIT/ML IV SOLN
INTRAVENOUS | Status: AC
  Filled 2020-10-16: qty 5

## 2020-10-16 MED ORDER — SODIUM CHLORIDE 0.9% FLUSH
10.0000 mL | INTRAVENOUS | Status: DC | PRN
  Administered 2020-10-16: 10 mL via INTRAVENOUS
  Filled 2020-10-16: qty 10

## 2020-10-16 MED ORDER — SODIUM CHLORIDE 0.9 % IV SOLN
Freq: Once | INTRAVENOUS | Status: AC
  Filled 2020-10-16: qty 250

## 2020-10-16 MED ORDER — SODIUM CHLORIDE 0.9 % IV SOLN
25.0000 mg | Freq: Once | INTRAVENOUS | Status: AC
  Administered 2020-10-16: 25 mg via INTRAVENOUS
  Filled 2020-10-16: qty 1

## 2020-10-16 NOTE — Progress Notes (Signed)
1300: Pt reports generalized pain from a fall that occurred "over the weekend" pt reports primarily pain is in her hands from "catching myself" and  Her hip. Pt reports that she has no concerns at this time. Scrapes noted on left head/face. Faythe Casa NP aware.  1420: Faythe Casa NP at chairside to assess patient. No further orders at this time.   Magnesium 1.4. per Dr. Kem Parkinson "nursing Communication" orders pt to receive 4 grams IV Magnesium. Verified orders with Faythe Casa NP, per Sonia Baller NP proceed with 4 grams IV Magnesium at this time.

## 2020-10-19 ENCOUNTER — Inpatient Hospital Stay: Payer: Medicare HMO

## 2020-10-19 ENCOUNTER — Other Ambulatory Visit: Payer: Self-pay

## 2020-10-19 ENCOUNTER — Ambulatory Visit: Payer: Medicare HMO

## 2020-10-19 ENCOUNTER — Other Ambulatory Visit: Payer: Medicare HMO

## 2020-10-19 VITALS — BP 144/78 | HR 80 | Temp 97.5°F | Resp 18

## 2020-10-19 DIAGNOSIS — C9000 Multiple myeloma not having achieved remission: Secondary | ICD-10-CM

## 2020-10-19 DIAGNOSIS — R110 Nausea: Secondary | ICD-10-CM | POA: Diagnosis not present

## 2020-10-19 DIAGNOSIS — G620 Drug-induced polyneuropathy: Secondary | ICD-10-CM

## 2020-10-19 DIAGNOSIS — E538 Deficiency of other specified B group vitamins: Secondary | ICD-10-CM | POA: Diagnosis not present

## 2020-10-19 DIAGNOSIS — R197 Diarrhea, unspecified: Secondary | ICD-10-CM | POA: Diagnosis not present

## 2020-10-19 DIAGNOSIS — T451X5A Adverse effect of antineoplastic and immunosuppressive drugs, initial encounter: Secondary | ICD-10-CM

## 2020-10-19 DIAGNOSIS — Z5112 Encounter for antineoplastic immunotherapy: Secondary | ICD-10-CM | POA: Diagnosis not present

## 2020-10-19 DIAGNOSIS — G629 Polyneuropathy, unspecified: Secondary | ICD-10-CM | POA: Diagnosis not present

## 2020-10-19 LAB — MAGNESIUM: Magnesium: 1.5 mg/dL — ABNORMAL LOW (ref 1.7–2.4)

## 2020-10-19 MED ORDER — HEPARIN SOD (PORK) LOCK FLUSH 100 UNIT/ML IV SOLN
500.0000 [IU] | Freq: Once | INTRAVENOUS | Status: DC
  Filled 2020-10-19: qty 5

## 2020-10-19 MED ORDER — SODIUM CHLORIDE 0.9 % IV SOLN: 200.0000 mg | Freq: Once | INTRAVENOUS | Status: DC

## 2020-10-19 MED ORDER — IRON SUCROSE 20 MG/ML IV SOLN
200.0000 mg | Freq: Once | INTRAVENOUS | Status: AC
  Administered 2020-10-19: 200 mg via INTRAVENOUS
  Filled 2020-10-19: qty 10

## 2020-10-19 MED ORDER — MAGNESIUM SULFATE 2 GM/50ML IV SOLN
2.0000 g | Freq: Once | INTRAVENOUS | Status: AC
  Administered 2020-10-19: 2 g via INTRAVENOUS
  Filled 2020-10-19: qty 50

## 2020-10-19 MED ORDER — SODIUM CHLORIDE 0.9 % IV SOLN
25.0000 mg | Freq: Once | INTRAVENOUS | Status: AC
  Administered 2020-10-19: 25 mg via INTRAVENOUS
  Filled 2020-10-19: qty 1

## 2020-10-19 MED ORDER — SODIUM CHLORIDE 0.9 % IV SOLN
INTRAVENOUS | Status: DC
Start: 2020-10-19 — End: 2021-03-26
  Filled 2020-10-19 (×2): qty 250

## 2020-10-23 ENCOUNTER — Inpatient Hospital Stay: Payer: Medicare HMO

## 2020-10-23 ENCOUNTER — Other Ambulatory Visit: Payer: Self-pay

## 2020-10-23 VITALS — BP 147/75 | HR 84 | Temp 93.8°F | Resp 18

## 2020-10-23 DIAGNOSIS — T451X5A Adverse effect of antineoplastic and immunosuppressive drugs, initial encounter: Secondary | ICD-10-CM | POA: Diagnosis not present

## 2020-10-23 DIAGNOSIS — G620 Drug-induced polyneuropathy: Secondary | ICD-10-CM | POA: Diagnosis not present

## 2020-10-23 DIAGNOSIS — E538 Deficiency of other specified B group vitamins: Secondary | ICD-10-CM | POA: Diagnosis not present

## 2020-10-23 DIAGNOSIS — R110 Nausea: Secondary | ICD-10-CM | POA: Diagnosis not present

## 2020-10-23 DIAGNOSIS — C9000 Multiple myeloma not having achieved remission: Secondary | ICD-10-CM

## 2020-10-23 DIAGNOSIS — Z5111 Encounter for antineoplastic chemotherapy: Secondary | ICD-10-CM

## 2020-10-23 DIAGNOSIS — G629 Polyneuropathy, unspecified: Secondary | ICD-10-CM | POA: Diagnosis not present

## 2020-10-23 DIAGNOSIS — R197 Diarrhea, unspecified: Secondary | ICD-10-CM | POA: Diagnosis not present

## 2020-10-23 DIAGNOSIS — Z5112 Encounter for antineoplastic immunotherapy: Secondary | ICD-10-CM | POA: Diagnosis not present

## 2020-10-23 LAB — MAGNESIUM: Magnesium: 1.2 mg/dL — ABNORMAL LOW (ref 1.7–2.4)

## 2020-10-23 MED ORDER — MAGNESIUM SULFATE 2 GM/50ML IV SOLN
2.0000 g | Freq: Once | INTRAVENOUS | Status: AC
  Administered 2020-10-23: 2 g via INTRAVENOUS
  Filled 2020-10-23: qty 50

## 2020-10-23 MED ORDER — SODIUM CHLORIDE 0.9 % IV SOLN: 6.0000 g | Freq: Once | INTRAVENOUS | Status: DC

## 2020-10-23 MED ORDER — SODIUM CHLORIDE 0.9 % IV SOLN
INTRAVENOUS | Status: DC
  Filled 2020-10-23: qty 250

## 2020-10-23 MED ORDER — HEPARIN SOD (PORK) LOCK FLUSH 100 UNIT/ML IV SOLN
500.0000 [IU] | Freq: Once | INTRAVENOUS | Status: AC
  Administered 2020-10-23: 500 [IU] via INTRAVENOUS
  Filled 2020-10-23: qty 5

## 2020-10-23 MED ORDER — MAGNESIUM SULFATE 4 GM/100ML IV SOLN
4.0000 g | Freq: Once | INTRAVENOUS | Status: AC
  Administered 2020-10-23: 4 g via INTRAVENOUS
  Filled 2020-10-23: qty 100

## 2020-10-25 ENCOUNTER — Other Ambulatory Visit: Payer: Self-pay

## 2020-10-25 DIAGNOSIS — C9000 Multiple myeloma not having achieved remission: Secondary | ICD-10-CM

## 2020-10-25 DIAGNOSIS — R197 Diarrhea, unspecified: Secondary | ICD-10-CM | POA: Insufficient documentation

## 2020-10-25 DIAGNOSIS — Z5111 Encounter for antineoplastic chemotherapy: Secondary | ICD-10-CM

## 2020-10-25 DIAGNOSIS — D649 Anemia, unspecified: Secondary | ICD-10-CM

## 2020-10-26 ENCOUNTER — Ambulatory Visit: Payer: Medicare HMO

## 2020-10-26 ENCOUNTER — Encounter: Payer: Self-pay | Admitting: Oncology

## 2020-10-26 ENCOUNTER — Other Ambulatory Visit: Payer: Self-pay

## 2020-10-26 ENCOUNTER — Inpatient Hospital Stay: Payer: Medicare HMO | Admitting: Oncology

## 2020-10-26 ENCOUNTER — Inpatient Hospital Stay: Payer: Medicare HMO

## 2020-10-26 VITALS — BP 132/64 | HR 67 | Temp 98.0°F | Resp 16 | Wt 157.3 lb

## 2020-10-26 VITALS — BP 112/59 | HR 71 | Temp 96.9°F | Resp 18

## 2020-10-26 DIAGNOSIS — C9000 Multiple myeloma not having achieved remission: Secondary | ICD-10-CM | POA: Diagnosis not present

## 2020-10-26 DIAGNOSIS — D509 Iron deficiency anemia, unspecified: Secondary | ICD-10-CM | POA: Diagnosis not present

## 2020-10-26 DIAGNOSIS — E538 Deficiency of other specified B group vitamins: Secondary | ICD-10-CM | POA: Diagnosis not present

## 2020-10-26 DIAGNOSIS — G629 Polyneuropathy, unspecified: Secondary | ICD-10-CM | POA: Diagnosis not present

## 2020-10-26 DIAGNOSIS — R110 Nausea: Secondary | ICD-10-CM | POA: Diagnosis not present

## 2020-10-26 DIAGNOSIS — Z5112 Encounter for antineoplastic immunotherapy: Secondary | ICD-10-CM | POA: Diagnosis not present

## 2020-10-26 DIAGNOSIS — D649 Anemia, unspecified: Secondary | ICD-10-CM

## 2020-10-26 DIAGNOSIS — G620 Drug-induced polyneuropathy: Secondary | ICD-10-CM | POA: Diagnosis not present

## 2020-10-26 DIAGNOSIS — Z5111 Encounter for antineoplastic chemotherapy: Secondary | ICD-10-CM

## 2020-10-26 DIAGNOSIS — T451X5A Adverse effect of antineoplastic and immunosuppressive drugs, initial encounter: Secondary | ICD-10-CM | POA: Diagnosis not present

## 2020-10-26 DIAGNOSIS — R197 Diarrhea, unspecified: Secondary | ICD-10-CM | POA: Diagnosis not present

## 2020-10-26 LAB — COMPREHENSIVE METABOLIC PANEL
ALT: 36 U/L (ref 0–44)
AST: 37 U/L (ref 15–41)
Albumin: 4.3 g/dL (ref 3.5–5.0)
Alkaline Phosphatase: 97 U/L (ref 38–126)
Anion gap: 7 (ref 5–15)
BUN: 26 mg/dL — ABNORMAL HIGH (ref 8–23)
CO2: 22 mmol/L (ref 22–32)
Calcium: 9 mg/dL (ref 8.9–10.3)
Chloride: 105 mmol/L (ref 98–111)
Creatinine, Ser: 1.22 mg/dL — ABNORMAL HIGH (ref 0.44–1.00)
GFR, Estimated: 50 mL/min — ABNORMAL LOW (ref >60)
Glucose, Bld: 141 mg/dL — ABNORMAL HIGH (ref 70–99)
Potassium: 4.3 mmol/L (ref 3.5–5.1)
Sodium: 134 mmol/L — ABNORMAL LOW (ref 135–145)
Total Bilirubin: 0.2 mg/dL — ABNORMAL LOW (ref 0.3–1.2)
Total Protein: 6.9 g/dL (ref 6.5–8.1)

## 2020-10-26 LAB — CBC WITH DIFFERENTIAL/PLATELET
Abs Immature Granulocytes: 0.02 10*3/uL (ref 0.00–0.07)
Basophils Absolute: 0 10*3/uL (ref 0.0–0.1)
Basophils Relative: 1 %
Eosinophils Absolute: 0.1 10*3/uL (ref 0.0–0.5)
Eosinophils Relative: 2 %
HCT: 29.6 % — ABNORMAL LOW (ref 36.0–46.0)
Hemoglobin: 9.5 g/dL — ABNORMAL LOW (ref 12.0–15.0)
Immature Granulocytes: 1 %
Lymphocytes Relative: 17 %
Lymphs Abs: 0.8 10*3/uL (ref 0.7–4.0)
MCH: 25.6 pg — ABNORMAL LOW (ref 26.0–34.0)
MCHC: 32.1 g/dL (ref 30.0–36.0)
MCV: 79.8 fL — ABNORMAL LOW (ref 80.0–100.0)
Monocytes Absolute: 0.6 10*3/uL (ref 0.1–1.0)
Monocytes Relative: 14 %
Neutro Abs: 2.9 10*3/uL (ref 1.7–7.7)
Neutrophils Relative %: 65 %
Platelets: 133 10*3/uL — ABNORMAL LOW (ref 150–400)
RBC: 3.71 MIL/uL — ABNORMAL LOW (ref 3.87–5.11)
RDW: 14.4 % (ref 11.5–15.5)
WBC: 4.4 10*3/uL (ref 4.0–10.5)
nRBC: 0 % (ref 0.0–0.2)

## 2020-10-26 LAB — IRON AND TIBC
Iron: 38 ug/dL (ref 28–170)
Saturation Ratios: 9 % — ABNORMAL LOW (ref 10.4–31.8)
TIBC: 430 ug/dL (ref 250–450)
UIBC: 392 ug/dL

## 2020-10-26 LAB — MAGNESIUM: Magnesium: 1.6 mg/dL — ABNORMAL LOW (ref 1.7–2.4)

## 2020-10-26 LAB — VITAMIN B12: Vitamin B-12: 703 pg/mL (ref 180–914)

## 2020-10-26 LAB — FERRITIN: Ferritin: 107 ng/mL (ref 11–307)

## 2020-10-26 MED ORDER — BORTEZOMIB CHEMO IV INJECTION 3.5 MG (1MG/ML-GENERIC)
2.0000 mg | Freq: Once | INTRAVENOUS | Status: AC
  Administered 2020-10-26: 2 mg via INTRAVENOUS
  Filled 2020-10-26: qty 2

## 2020-10-26 MED ORDER — HEPARIN SOD (PORK) LOCK FLUSH 100 UNIT/ML IV SOLN
250.0000 [IU] | Freq: Once | INTRAVENOUS | Status: DC | PRN
  Filled 2020-10-26: qty 5

## 2020-10-26 MED ORDER — SODIUM CHLORIDE 0.9 % IV SOLN
Freq: Once | INTRAVENOUS | Status: AC
  Filled 2020-10-26: qty 250

## 2020-10-26 MED ORDER — SODIUM CHLORIDE 0.9 % IV SOLN
25.0000 mg | Freq: Once | INTRAVENOUS | Status: AC
  Administered 2020-10-26: 25 mg via INTRAVENOUS
  Filled 2020-10-26: qty 1

## 2020-10-26 MED ORDER — HEPARIN SOD (PORK) LOCK FLUSH 100 UNIT/ML IV SOLN
500.0000 [IU] | Freq: Once | INTRAVENOUS | Status: AC
  Administered 2020-10-26: 500 [IU] via INTRAVENOUS
  Filled 2020-10-26: qty 5

## 2020-10-26 MED ORDER — SODIUM CHLORIDE 0.9 % IV SOLN: 200.0000 mg | Freq: Once | INTRAVENOUS | Status: DC

## 2020-10-26 MED ORDER — SODIUM CHLORIDE 0.9 % IV SOLN
1.0000 g | Freq: Once | INTRAVENOUS | Status: AC
  Administered 2020-10-26: 1 g via INTRAVENOUS
  Filled 2020-10-26: qty 2

## 2020-10-26 MED ORDER — SODIUM CHLORIDE 0.9% FLUSH
10.0000 mL | INTRAVENOUS | Status: DC | PRN
  Filled 2020-10-26: qty 10

## 2020-10-26 MED ORDER — IRON SUCROSE 20 MG/ML IV SOLN
200.0000 mg | Freq: Once | INTRAVENOUS | Status: AC
  Administered 2020-10-26: 200 mg via INTRAVENOUS
  Filled 2020-10-26: qty 10

## 2020-10-26 NOTE — Progress Notes (Signed)
[facility]  [address] [place], [place] [address] Phone: [phone]  Fax: [phone]   Clinic Day:  10/26/2020   Referring physician: Glean Hess, MD  Chief Complaint: Sarah [name] is a 65 y.o. 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  HPI: The patient was last seen in the medical oncology clinic on 10/12/20.  At that time, she has felt good. She has had some diarrhea since starting Velcade. She has been using Lomotil, which helps. Stool sample was negative for infection. Her neuropathy was stable. Her appetite was great but she still becomes nauseous after she eats.   She is in the process of moving. When she lifts too many boxes, she gets a bit short of breath. Her knee is still a bit sore s/p fall in 08/2020.  Magnesium has ranged from 1.3-1.4 over the past month.  She has received 4 - 6 gm IV magnesium with each visit.  During the interim, she has felt good.  She reports improvement of her shortness of breath since her iron infusion last week.  Feels her energy level has improved as well. No new symptoms.    Velcade last given on  Past Medical History:  Diagnosis Date  . Abnormal stress test 02/14/2016   Overview:  Added automatically from request for surgery [id number]  . Anemia   . Anxiety   . Arthritis   . Bicuspid aortic valve   . CHF (congestive heart failure) ([place])   . CKD (chronic kidney disease) stage 3, GFR 30-59 ml/min (HCC)   . Depression   . Diabetes mellitus ([place])   . Dizziness   . Fatty liver   . Frequent falls   . GERD (gastroesophageal reflux disease)   . Gout   . Heart murmur   . History of blood transfusion   . History of bone marrow transplant ([place])   . History of uterine fibroid   . Hx of cardiac catheterization 06/05/2016   Overview:  Normal coronaries 2017  . Hypertension   . Hypomagnesemia   . Multiple  myeloma ([place])   . Personal history of chemotherapy   . Renal cyst     Past Surgical History:  Procedure Laterality Date  . ABDOMINAL HYSTERECTOMY    . 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 CARDIAC ELECTROPHYSIOLOGY MAPPING AND ABLATION    . CARPAL TUNNEL RELEASE Bilateral   . CHOLECYSTECTOMY  2008  . COLONOSCOPY WITH PROPOFOL N/A 05/07/2017   Procedure: COLONOSCOPY WITH PROPOFOL;  Surgeon: [name], MD;  [facility] ENDOSCOPY;  Service: Gastroenterology;  Laterality: N/A;  . ESOPHAGOGASTRODUODENOSCOPY (EGD) WITH PROPOFOL N/A 05/07/2017   Procedure: ESOPHAGOGASTRODUODENOSCOPY (EGD) WITH PROPOFOL;  Surgeon: [name], MD;  Location: [facility] ENDOSCOPY;  Service: Gastroenterology;  Laterality: N/A;  . FOOT SURGERY Bilateral   . INCONTINENCE SURGERY  2009  . INTERSTIM IMPLANT PLACEMENT    . other     over active bladder  . OTHER SURGICAL HISTORY     bladder stimulator   . PARTIAL HYSTERECTOMY  03/1996   fibroids  . PORTA CATH INSERTION N/A 03/10/2019   Procedure: PORTA CATH INSERTION;  Surgeon: Algernon Huxley, MD;  Location: [facility] LAB;  Service: Cardiovascular;  Laterality: N/A;  . TONSILLECTOMY  2007    Family History  Problem Relation Age of Onset  . Colon cancer Father   . Renal Disease Father   .  Diabetes Mellitus II Father   . Melanoma Paternal Grandmother   . Breast cancer Maternal Aunt 49  . Anemia Mother   . Heart disease Mother   . Heart failure Mother   . Renal Disease Mother   . Congestive Heart Failure Mother   . Heart disease Maternal Uncle   . Throat cancer Maternal Uncle   . Lung cancer Maternal Uncle   . Liver disease Maternal Uncle   . Heart failure Maternal Uncle   . Hearing loss Son 79       Suicide     Social History:  reports that she quit smoking about 29 years ago. Her smoking use included cigarettes. She has a 20.00 pack-year smoking history. She has never used smokeless tobacco. She reports current alcohol use. She reports that she does  not use drugs. Patient has not had ETOH in several months. She is on disability. She notes exposure to perchloroethylene [facility]).She lives much of her adult life in [place]. She was married with 2 sons. Her husband passed away. Her 2 sons took their own lives (age 9 and 65). She worked in [employer] in [employer]. She went back to school and earned an [employer] and works for [employer] for several years.She [place] sistersin [place]. She has two dogs, Harley and Rocklin. The patient is alone today.   Allergies:  Allergies  Allergen Reactions  . Oxycodone-Acetaminophen Anaphylaxis    Swelling and rash  . Celebrex [Celecoxib] Diarrhea  . Codeine   . Plerixafor     In 2016 during ASCT collection patient developed fever to 103.40F and required hospitalization  . Benadryl [Diphenhydramine] Palpitations  . Morphine Itching and Rash  . Ondansetron Diarrhea  . Tylenol [Acetaminophen] Itching and Rash    Current Medications: Current Outpatient Medications  Medication Sig Dispense Refill  . acetaminophen (TYLENOL) 500 MG tablet Take 500 mg by mouth every 6 (six) hours as needed.  (Patient not taking: No sig reported)    . acyclovir (ZOVIRAX) 200 MG capsule Take by mouth.    Marland Kitchen allopurinol (ZYLOPRIM) 100 MG tablet TAKE 1 TABLET(100 MG) BY MOUTH DAILY as needed 90 tablet 1  . bisoprolol (ZEBETA) 5 MG tablet Take 5 mg by mouth daily.    . diclofenac sodium (VOLTAREN) 1 % GEL Apply 2 g topically 4 (four) times daily. 100 g 1  . diphenhydrAMINE (BENADRYL) 50 MG capsule Take 50 mg by mouth as needed.    . fexofenadine (ALLEGRA) 180 MG tablet TAKE 1 TABLET(180 MG) BY MOUTH DAILY 30 tablet 5  . FLUoxetine (PROZAC) 40 MG capsule TAKE 1 CAPSULE EVERY DAY 90 capsule 1  . fluticasone (FLONASE) 50 MCG/ACT nasal spray Place 2 sprays into both nostrils daily. 16 g 2  . glucose blood test strip     . lactulose (CHRONULAC) 10 GM/15ML solution Take 20 g by mouth daily as needed.   (Patient not taking: No sig reported)    . lidocaine (LIDODERM) 5 % 1 patch daily.    Marland Kitchen loperamide (IMODIUM) 2 MG capsule Take 1 capsule (2 mg total) by mouth 4 (four) times daily as needed for diarrhea or loose stools. 30 capsule 1  . metFORMIN (GLUCOPHAGE-XR) 500 MG 24 hr tablet TAKE 1 TABLET EVERY DAY WITH BREAKFAST (Patient not taking: Reported on 10/12/2020) 90 tablet 1  . methocarbamol (ROBAXIN) 500 MG tablet TAKE 1 TABLET FOUR TIMES DAILY. 120 tablet 0  . Multiple Vitamin (MULTIVITAMIN) tablet Take 1 tablet by mouth daily.    Marland Kitchen  Multiple Vitamins-Minerals (HAIR/SKIN/NAILS) TABS Take 1 tablet by mouth daily.    Marland Kitchen omeprazole (PRILOSEC) 40 MG capsule TAKE 1 CAPSULE EVERY DAY 90 capsule 0  . promethazine (PHENERGAN) 25 MG tablet Take 1 tablet (25 mg total) by mouth every 6 (six) hours as needed for nausea or vomiting. 90 tablet 0  . traZODone (DESYREL) 100 MG tablet Take 1 tablet (100 mg total) by mouth at bedtime. 90 tablet 1   No current facility-administered medications for this visit.   Facility-Administered Medications Ordered in Other Visits  Medication Dose Route Frequency Provider Last Rate Last Admin  . 0.9 %  sodium chloride infusion   Intravenous Continuous Lequita Asal, MD   Stopped at 01/20/20 1232  . 0.9 %  sodium chloride infusion   Intravenous Continuous Nolon Stalls C, MD 10 mL/hr at 10/19/20 0900 New Bag at 10/19/20 0900  . heparin lock flush 100 unit/mL  500 Units Intravenous Once Faythe Casa E, NP      . heparin lock flush 100 unit/mL  500 Units Intravenous Once Corcoran, Melissa C, MD      . sodium chloride flush (NS) 0.9 % injection 10 mL  10 mL Intravenous PRN Lequita Asal, MD   10 mL at 02/07/20 0815  . sodium chloride flush (NS) 0.9 % injection 10 mL  10 mL Intracatheter PRN Cammie Sickle, MD   10 mL at 02/10/20 0815  . sodium chloride flush (NS) 0.9 % injection 10 mL  10 mL Intravenous PRN Jacquelin Hawking, NP   10 mL at 03/01/20 1143     Review of Systems  Constitutional: Positive for malaise/fatigue. Negative for chills, fever and weight loss.  HENT: Negative for congestion, ear pain and tinnitus.   Eyes: Negative.  Negative for blurred vision and double vision.  Respiratory: Positive for shortness of breath. Negative for cough and sputum production.   Cardiovascular: Negative.  Negative for chest pain, palpitations and leg swelling.  Gastrointestinal: Negative.  Negative for abdominal pain, constipation, diarrhea, nausea and vomiting.  Genitourinary: Negative for dysuria, frequency and urgency.  Musculoskeletal: Negative for back pain and falls.  Skin: Negative.  Negative for rash.  Neurological: Positive for weakness. Negative for headaches.  Endo/Heme/Allergies: Negative.  Does not bruise/bleed easily.  Psychiatric/Behavioral: Negative.  Negative for depression. The patient is not nervous/anxious and does not have insomnia.     Performance status (ECOG): 1  Vitals There were no vitals taken for this visit.  Physical Exam Vitals and nursing note reviewed.  Constitutional:      General: She is not in acute distress.    Appearance: She is well-developed. She is not ill-appearing, toxic-appearing or diaphoretic.     Interventions: Face mask in place.  HENT:     Head: Normocephalic and atraumatic.     Comments: Short gray hair.    Mouth/Throat:     Mouth: Mucous membranes are moist.     Pharynx: Oropharynx is clear.  Eyes:     General: No scleral icterus.    Extraocular Movements: Extraocular movements intact.     Conjunctiva/sclera: Conjunctivae normal.     Pupils: Pupils are equal, round, and reactive to light.     Comments: Glasses. Brown eyes.   Cardiovascular:     Rate and Rhythm: Normal rate and regular rhythm.     Heart sounds: Normal heart sounds. No murmur heard.   Pulmonary:     Effort: Pulmonary effort is normal. No respiratory distress.  Breath sounds: Normal breath sounds. No wheezing  or rales.  Chest:     Chest wall: No tenderness.  Breasts:     Right: No supraclavicular adenopathy.     Left: No supraclavicular adenopathy.    Abdominal:     General: Bowel sounds are normal. There is no distension.     Palpations: Abdomen is soft. There is no hepatomegaly, splenomegaly or mass.     Tenderness: There is no abdominal tenderness. There is no guarding or rebound.  Musculoskeletal:        General: Swelling (right knee, subtle fullness) and tenderness (BLE, medial right knee) present. Normal range of motion.     Cervical back: Normal range of motion and neck supple.     Right lower leg: No edema.     Left lower leg: No edema.  Lymphadenopathy:     Head:     Right side of head: No preauricular, posterior auricular or occipital adenopathy.     Left side of head: No preauricular, posterior auricular or occipital adenopathy.     Cervical: No cervical adenopathy.     Upper Body:     Right upper body: No supraclavicular adenopathy.     Left upper body: No supraclavicular adenopathy.  Skin:    General: Skin is warm and dry.     Coloration: Skin is not jaundiced or pale.     Findings: No erythema.  Neurological:     Mental Status: She is alert and oriented to person, place, and time. Mental status is at baseline.  Psychiatric:        Behavior: Behavior normal.        Thought Content: Thought content normal.        Judgment: Judgment normal.    No visits with results within 3 Day(s) from this visit.  Latest known visit with results is:  Infusion on 10/23/2020  Component Date Value Ref Range Status  . Magnesium 10/23/2020 1.2* 1.7 - 2.4 mg/dL Final   Performed at [facility], [address], [place], [place] [address]    Assessment:  Sarah [name] is a 65 y.o. 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  Initial bone marrow revealed 80% plasma cells. Lambda free light chains were 1340. She had nephrotic range proteinuria. She initially underwent induction with RVD. Revlimid maintenance was discontinued on 01/21/2017 secondary to intolerance.   Bone marrowaspirate andbiopsyon 01/18/2021revealed anormocellularmarrow withbut increased lambda-restricted plasma cells (9% aspirate, 40% CD138 immunohistochemistry).Findingswereconsistent with recurrent plasma cell myeloma.Flow cytometry revealed no monoclonal B-cell or phenotypically aberrant T-cell population. Cytogeneticswere 11, XX (normal). 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  M-spikehas been followed: 0 on 04/02/2016 -06/23/2019; 0.2 on 09/02/2017, 0.1 on 11/25/2019, 0.1 on 01/10/2020, 0.2 on 02/07/2020, 0.1 on 03/07/2020, 0 on 04/10/2020, and 06/19/2020.  Lambda light chainshave been followed: 22.2 (ratio 0.56) on 07/03/2017, 30.8 (ratio 0.78) on 09/02/2017, 36.9 (ratio 0.40) on 10/21/2017, 37.4 (ratio 0.41) on 12/16/2017, 70.7(ratio 0.31) on 02/17/2018, 64.2 (ratio 0.27) on 04/07/2018, 78.9 (ratio 0.18) on 05/26/2018, 128.8 (ratio 0.17) on 08/06/2018, 181.5 (ratio 0.13) on 10/08/2018, 130.9 (ratio 0.13) on 10/20/2018, 160.7 (ratio 0.10)on 12/09/2018, 236.6 (ratio 0.07) on 02/01/2019, 363.6 (ratio 0.04) on 03/22/2019, 404.8 (ratio 0.04) on 04/05/2019, 420.7 (ratio 0.03) on 05/24/2019, 573.4 (ratio 0.03) on 06/23/2019, 451.05(ratio 0.02) on02/19/2021, 47.2 (ratio 0.13) on 10/25/2019, 22.4 (  ratio 0.21) on 11/25/2019, 16.5 (ratio 0.33) on 01/10/2020, 14.6 (ratio 0.34) on 02/07/2020, 13.1 (ratio 0.31) on 03/07/2020, 10.1 (ratio 0.38) on 04/10/2020, and 9.5 (ratio 0.21) on 06/19/2020.  24 hour UPEPon 06/03/2019 revealed kappa free light chains95.76,lambda free light chains1,260.71, andratio 0.08.24 hourUPEPon 02/22/2021revealed totalproteinof769m/24 hrs withlambdafree light  chains 1,084.154mL andratioof0.10(1.03-31.76). M spike inurinewas46.1%(36165m4 hrs).  Bone surveyon 04/08/2016 and11/28/2018 revealed no definite lytic lesion seen in the visualized skeleton.Bone surveyon 11/19/2018 revealed no suspicious lucent lesionsand no acute bony abnormality.PET scanon 07/12/2019 revealed no focal metabolic activity to suggest active myeloma within the skeleton. There wereno lytic lesions identified on the CT portion of the examorsoft tissue plasmacytomas. There was no evidence of multiple myeloma.  Pretreatment RBC phenotypeon 09/23/2019 waspositivefor C, e, DUFFY B, KIDD B, M, S, and s antigen; negativefor c, E, KELL, DUFFY A, KIDD A, and N antigen.  Shereceived 6 cycles of daratumumab and hyaluronidase-fihj, Pomalyst, and Decadron (DPd)(09/27/2019 - 10/25/2019; 12/09/2019 - 03/13/2020). Cycle #1 was complicated by fever and neutropenia requiring admission.  Cycle #2 was complicated by pneumonia requiring admission.  Cycle #6 was complicated with an ER evaluation for an elevated lactic acid.  She is day 127 s/p [facility].  She underwent conditioning with melphalan 140 mg/m2.    She is s/p week #3 Velcade maintenance (began 08/31/2020 - 09/28/2020).  She has no increase in neuropathy.  She has severe aortic stenosis.  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 Cardiology is following for a possible TAVR in the future.  Echo on 07/05/2020 revealed moderate to severe aortic stenosis with an EF of 50-55%.  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 Zometa was discontinued in 01/2017. She has chronic nauseaon Phenergan.  She has B12 deficiency. B12 was 254 on 04/09/2017, 295 on 08/20/2018, and 391 on 10/08/2018. Shewasonoral B12.She received B12 monthly (last03/17/2022).Folate  was12.1on 01/10/2020.  She has iron deficiency. Ferritin was 32 on 07/01/2019. She received Venoferon 07/15/2019 and 07/22/2019.  She has hypogammaglobulinemia.  IgG was 245 on 11/25/2019.  She received monthly IVIG (07/22/021 - 03/22/2020).  She received IVIG 400 mg/kg on 01/20/2020, 200 mg/kg on 02/17/2020, and 300 mg/kg on 03/22/2020.  IVIG on 07/84/16/6063s complicated by acute renal failure. IgG trough level was 418 on 02/17/2020.  [facility] with fever and neutropenia. Cultures were negative. CXR was negative. She received broad spectrum antibiotics and daily Granix. She received IVF for acute renal insufficiency due to diarrhea and dehydration. Creatine was 1.66 on admission and 1.12 on discharge.  [facility] with fever and neutropenia. CXR revealed no active cardiopulmonary disease. Chest CT with contrast revealed no acute intrathoracic pathology. There were findings which could be suggestive of prior granulomatous disease. She was treated with Cefepime and Vancomycin, then switched to ciprofloxacin on 03/03/2020.   [facility] for autologous bone marrow transplant.   She received conditioning with melphalan 140 mg/m2.  Bone marrow transplant was on 05/10/2020. The patient developed fevers daily from 05/19/2020 - 05/24/2020. Blood cultures were + for strept sanguis bacteremia.  She was treated cefepime, vancomycin, and solumedrol for possible engraftment syndrome. Cefepime was switched to ceftriaxone; she completed 2 weeks of ceftriaxone on 06/03/2020. She had chemotherapy induced diarrhea.  She experienced urinary retention.  She received the [employer]  COVID-19vaccineon 09/20/2019 and 10/09/2019. She received the Booster on 08/10/2020. She received the tetanus shot on 08/20/2020. She received the shingles vaccine recently.  Symptomatically, she  feels good. She has had some diarrhea since starting Velcade.  Her neuropathy is stable. Her appetite is great.  She has chronic nausea.  Exam is stable.  Plan: 1.   Labs today: CBC with diff, CMP, Mg, SPEP, FLCA 2. Stage III multiple myeloma  She is status post stem cell transplant on 05/10/2020  Symptomatically she is doing well.  Neuropathy is stable.  Exam is unremarkable.  She is on Valtrex for infection prophylaxis  Labs from 10/26/2020 are acceptable for treatment  Proceed with cycle 5 Velcade today. 3.   Microcytic anemia  Labs from 10/26/2020 show hemoglobin of 9.5, hematocrit 29.6 and MCV 79.8.  Ferritin 107 and iron saturation 9%  She received IV Venofer on 10/19/2020.  Proceed with IV iron today  Plan for total of 3 doses of IV Venofer.  Next dose due on 11/02/2020. 4. Grade II peripheral neuropathy  Neuropathy is slightly worse s/p 2nd transplant.  Neuropathy stable.  Continue to monitor. 5.   Chronic hypomagnesemia Magnesium1.6 today.  Magnesium 2 gm IV today.  Continue magnesium checks twice weekly (Mondays and Thursdays). 6.   Chronic nausea  Continue Phenergan IV when she is in clinic.  Patient has Phenergan p.o. to use at home. 7.   B12 deficiency  Patient receives B12 monthly.  B12 today and monthly x 6.  Vitamin B12 level is 703.   Proceed with B12 today. 8.   Diarrhea  Etiology likely secondary to Velcade (incidence 19-52%).  Stool negative for infection.  This has resolved.  Disposition: Proceed with cycle 5 Velcade today. Proceed with 2 g IV magnesium Okay to give Phenergan for nausea RTC on Monday and Thursdays x 3 weeks for labs (Mg) and +/- IV Mg and Phenergan. RTC on 11/02/2020 for labs (CBC with diff, CMP, Mg), IV Venofer and possible IV magnesium + Phenergan prn.  RTC on 11/09/2020 for labs ((CBC with diff, CMP, Mg, SPEP, FLCA), B12 injection and cycle 6 of Velcade.  I discussed the assessment and treatment plan with the  patient.  The patient was provided an opportunity to ask questions and all were answered.  The patient agreed with the plan and demonstrated an understanding of the instructions.  The patient was advised to call back if the symptoms worsen or if the condition fails to improve as anticipated.   Greater than 50% was spent in counseling and coordination of care with this patient including but not limited to discussion of the relevant topics above (See A&P) including, but not limited to diagnosis and management of acute and chronic medical conditions.   Faythe Casa, NP 10/26/2020 1:14 PM

## 2020-10-26 NOTE — Patient Instructions (Signed)
[facility]  Discharge Instructions: Thank you for choosing [facility] to provide your oncology and hematology care.  If you have a lab appointment with the [facility], please go directly to the [facility] and check in at the registration area.  Wear comfortable clothing and clothing appropriate for easy access to any Portacath or PICC line.   We strive to give you quality time with your provider. You may need to reschedule your appointment if you arrive late (15 or more minutes).  Arriving late affects you and other patients whose appointments are after yours.  Also, if you miss three or more appointments without notifying the office, you may be dismissed from the clinic at the provider's discretion.      For prescription refill requests, have your pharmacy contact our office and allow 72 hours for refills to be completed.    Today you received the following chemotherapy and/or immunotherapy agents; Velcade      To help prevent nausea and vomiting after your treatment, we encourage you to take your nausea medication as directed.  BELOW ARE SYMPTOMS THAT SHOULD BE REPORTED IMMEDIATELY: . *FEVER GREATER THAN 100.4 F (38 C) OR HIGHER . *CHILLS OR SWEATING . *NAUSEA AND VOMITING THAT IS NOT CONTROLLED WITH YOUR NAUSEA MEDICATION . *UNUSUAL SHORTNESS OF BREATH . *UNUSUAL BRUISING OR BLEEDING . *URINARY PROBLEMS (pain or burning when urinating, or frequent urination) . *BOWEL PROBLEMS (unusual diarrhea, constipation, pain near the anus) . TENDERNESS IN MOUTH AND THROAT WITH OR WITHOUT PRESENCE OF ULCERS (sore throat, sores in mouth, or a toothache) . UNUSUAL RASH, SWELLING OR PAIN  . UNUSUAL VAGINAL DISCHARGE OR ITCHING   Items with * indicate a potential emergency and should be followed up as soon as possible or go to the Emergency Department if any problems should occur.  Please show the CHEMOTHERAPY ALERT CARD or IMMUNOTHERAPY ALERT CARD at  check-in to the Emergency Department and triage nurse.  Should you have questions after your visit or need to cancel or reschedule your appointment, please contact [facility]  [phone] and follow the prompts.  Office hours are 8:00 a.m. to 4:30 p.m. Monday - Friday. Please note that voicemails left after 4:00 p.m. may not be returned until the following business day.  We are closed weekends and major holidays. You have access to a nurse at all times for urgent questions. Please call the main number to the clinic [phone] and follow the prompts.  For any non-urgent questions, you may also contact your provider using MyChart. We now offer e-Visits for anyone 36 and older to request care online for non-urgent symptoms. For details visit [email or web address].   Also download the MyChart app! Go to the app store, search "MyChart", open the app, select [facility], and log in with your MyChart username and password.  Due to Covid, a mask is required upon entering the hospital/clinic. If you do not have a mask, one will be given to you upon arrival. For doctor visits, patients may have 1 support person aged 58 or older with them. For treatment visits, patients cannot have anyone with them due to current Covid guidelines and our immunocompromised population.

## 2020-10-26 NOTE — Progress Notes (Signed)
Pt states she is still having trouble with diarrhea.

## 2020-10-27 LAB — KAPPA/LAMBDA LIGHT CHAINS
Kappa free light chain: 5.1 mg/L (ref 3.3–19.4)
Kappa, lambda light chain ratio: 0.68 (ref 0.26–1.65)
Lambda free light chains: 7.5 mg/L (ref 5.7–26.3)

## 2020-10-30 ENCOUNTER — Telehealth: Payer: Self-pay | Admitting: Oncology

## 2020-10-30 ENCOUNTER — Ambulatory Visit: Payer: Medicare PPO

## 2020-10-30 ENCOUNTER — Ambulatory Visit
Admission: EM | Admit: 2020-10-30 | Discharge: 2020-10-30 | Disposition: A | Discharge status: Home / Self Care | Payer: Medicare HMO | Attending: Family Medicine | Admitting: Family Medicine

## 2020-10-30 ENCOUNTER — Other Ambulatory Visit: Payer: Self-pay

## 2020-10-30 ENCOUNTER — Encounter: Payer: Self-pay | Admitting: Emergency Medicine

## 2020-10-30 ENCOUNTER — Other Ambulatory Visit: Payer: Medicare HMO

## 2020-10-30 ENCOUNTER — Ambulatory Visit: Payer: Medicare HMO

## 2020-10-30 DIAGNOSIS — H6592 Unspecified nonsuppurative otitis media, left ear: Secondary | ICD-10-CM | POA: Diagnosis not present

## 2020-10-30 DIAGNOSIS — Z886 Allergy status to analgesic agent status: Secondary | ICD-10-CM | POA: Diagnosis not present

## 2020-10-30 DIAGNOSIS — Z87891 Personal history of nicotine dependence: Secondary | ICD-10-CM | POA: Diagnosis not present

## 2020-10-30 DIAGNOSIS — Z885 Allergy status to narcotic agent status: Secondary | ICD-10-CM | POA: Insufficient documentation

## 2020-10-30 DIAGNOSIS — Z79899 Other long term (current) drug therapy: Secondary | ICD-10-CM | POA: Insufficient documentation

## 2020-10-30 DIAGNOSIS — Z7984 Long term (current) use of oral hypoglycemic drugs: Secondary | ICD-10-CM | POA: Diagnosis not present

## 2020-10-30 DIAGNOSIS — C9000 Multiple myeloma not having achieved remission: Secondary | ICD-10-CM | POA: Insufficient documentation

## 2020-10-30 DIAGNOSIS — Z9484 Stem cells transplant status: Secondary | ICD-10-CM | POA: Insufficient documentation

## 2020-10-30 DIAGNOSIS — Z888 Allergy status to other drugs, medicaments and biological substances status: Secondary | ICD-10-CM | POA: Diagnosis not present

## 2020-10-30 DIAGNOSIS — D84821 Immunodeficiency due to drugs: Secondary | ICD-10-CM | POA: Diagnosis not present

## 2020-10-30 DIAGNOSIS — R059 Cough, unspecified: Secondary | ICD-10-CM | POA: Insufficient documentation

## 2020-10-30 DIAGNOSIS — Z20822 Contact with and (suspected) exposure to covid-19: Secondary | ICD-10-CM | POA: Diagnosis not present

## 2020-10-30 LAB — SARS CORONAVIRUS 2 (TAT 6-24 HRS): SARS Coronavirus 2: NEGATIVE

## 2020-10-30 LAB — MULTIPLE MYELOMA PANEL, SERUM
Albumin SerPl Elph-Mcnc: 3.9 g/dL (ref 2.9–4.4)
Albumin/Glob SerPl: 1.7 (ref 0.7–1.7)
Alpha 1: 0.3 g/dL (ref 0.0–0.4)
Alpha2 Glob SerPl Elph-Mcnc: 1 g/dL (ref 0.4–1.0)
B-Globulin SerPl Elph-Mcnc: 1 g/dL (ref 0.7–1.3)
Gamma Glob SerPl Elph-Mcnc: 0.1 g/dL — ABNORMAL LOW (ref 0.4–1.8)
Globulin, Total: 2.4 g/dL (ref 2.2–3.9)
IgA: 8 mg/dL — ABNORMAL LOW (ref 87–352)
IgG (Immunoglobin G), Serum: 199 mg/dL — ABNORMAL LOW (ref 586–1602)
IgM (Immunoglobulin M), Srm: 9 mg/dL — ABNORMAL LOW (ref 26–217)
Total Protein ELP: 6.3 g/dL (ref 6.0–8.5)

## 2020-10-30 MED ORDER — PROMETHAZINE-DM 6.25-15 MG/5ML PO SYRP: 5.0000 mL | ORAL_SOLUTION | Freq: Four times a day (QID) | ORAL | 0 refills | Status: DC | PRN

## 2020-10-30 MED ORDER — AMOXICILLIN-POT CLAVULANATE 875-125 MG PO TABS: 1.0000 | ORAL_TABLET | Freq: Two times a day (BID) | ORAL | 0 refills | Status: DC

## 2020-10-30 MED ORDER — FLUCONAZOLE 150 MG PO TABS: 150.0000 mg | ORAL_TABLET | Freq: Once | ORAL | 0 refills | Status: AC

## 2020-10-30 NOTE — ED Provider Notes (Signed)
[facility]    CSN: [id number] Arrival date & time: 10/30/20  0853      History   Chief Complaint Chief Complaint  Patient presents with  . Cough  . Nasal Congestion   HPI  65 year old female presents with respiratory symptoms.  Symptoms x4 days.  She reports productive cough, congestion.  She also reports ear pain, right greater than left.  Her sister has been sick as well.  She is immunocompromised as she is on chemotherapy for multiple myeloma.  She is a former smoker.  No fever.  No relieving factors.  She rates her pain as 2/10 in severity.  Past Medical History:  Diagnosis Date  . Abnormal stress test 02/14/2016   Overview:  Added automatically from request for surgery [id number]  . Anemia   . Anxiety   . Arthritis   . Bicuspid aortic valve   . CHF (congestive heart failure) ([place])   . CKD (chronic kidney disease) stage 3, GFR 30-59 ml/min (HCC)   . Depression   . Diabetes mellitus ([place])   . Dizziness   . Fatty liver   . Frequent falls   . GERD (gastroesophageal reflux disease)   . Gout   . Heart murmur   . History of blood transfusion   . History of bone marrow transplant ([place])   . History of uterine fibroid   . Hx of cardiac catheterization 06/05/2016   Overview:  Normal coronaries 2017  . Hypertension   . Hypomagnesemia   . Multiple myeloma ([place])   . Personal history of chemotherapy   . Renal cyst     Patient Active Problem List   Diagnosis Date Noted  . Diarrhea 10/25/2020  . Encounter for antineoplastic chemotherapy 08/31/2020  . Neuropathy 08/20/2020  . Yeast vaginitis 03/18/2020  . Hypotension 03/18/2020  . Hypokalemia 03/11/2020  . Malnutrition of moderate degree 03/03/2020  . Antineoplastic chemotherapy induced pancytopenia ([place]) 03/03/2020  . Dyspnea 03/02/2020  . Physical deconditioning 02/07/2020  . Impaired nutrition 02/07/2020  . 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 Constipation 10/24/2019  . Encounter for antineoplastic  immunotherapy 10/03/2019  . Hypocalcemia 08/28/2019  . Stage 3 chronic kidney disease ([place]) 05/12/2019  . Chronic nausea 12/17/2018  . Chemotherapy-induced neuropathy ([place]) 12/17/2018  . Hyperlipidemia associated with type 2 diabetes mellitus ([place]) 11/25/2018  . Anemia 10/21/2018  . Hypomagnesemia 08/20/2018  . Goals of care, counseling/discussion 08/20/2018  . B12 deficiency 08/20/2018  . Thrombocytopenia ([place]) 02/09/2018  . Benign essential HTN 08/21/2017  . Cardiomyopathy, idiopathic ([place]) 08/21/2017  . Carotid artery stenosis, asymptomatic, bilateral 08/06/2017  . Thyroid nodule 08/06/2017  . Cardiac syncope 07/25/2017  . Iron deficiency anemia due to chronic blood loss 05/13/2017  . Spinal stenosis of lumbar region with neurogenic claudication 04/09/2017  . 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 LBBB (left bundle branch block) 10/10/2016  . Long term prescription opiate use 09/07/2016  . Chronic pain syndrome 07/08/2016  . Pain medication agreement signed 07/08/2016  . Spondylosis of lumbar region without myelopathy or radiculopathy 07/08/2016  . Chronic diastolic CHF (congestive heart failure), NYHA class 2 ([place]) 06/05/2016  . LVH (left ventricular hypertrophy) due to hypertensive disease, with heart failure ([place]) 06/05/2016  . Severe aortic stenosis 06/05/2016  . 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[place]) 06/05/2016  . Environmental and seasonal allergies 04/19/2016  . Insomnia 04/19/2016  . Bicuspid aortic valve 04/19/2016  . Asthma 04/19/2016  . Aortic regurgitation 04/19/2016  . Type II diabetes mellitus with complication ([place]) 78/29/5621  .  Depression, major, single episode, in partial remission ([place]) 04/12/2016  . Irritable bowel syndrome (IBS) 04/12/2016  . Hepatic steatosis 04/12/2016  . Multiple myeloma not having achieved remission ([place]) 04/02/2016  . 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 Stem cell transplant candidate  05/16/2015    Past Surgical History:  Procedure Laterality Date  . ABDOMINAL HYSTERECTOMY    . 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 CARDIAC ELECTROPHYSIOLOGY MAPPING AND ABLATION    . CARPAL TUNNEL RELEASE Bilateral   . CHOLECYSTECTOMY  2008  . COLONOSCOPY WITH PROPOFOL N/A 05/07/2017   Procedure: COLONOSCOPY WITH PROPOFOL;  Surgeon: [name], MD;  Location: [facility] ENDOSCOPY;  Service: Gastroenterology;  Laterality: N/A;  . ESOPHAGOGASTRODUODENOSCOPY (EGD) WITH PROPOFOL N/A 05/07/2017   Procedure: ESOPHAGOGASTRODUODENOSCOPY (EGD) WITH PROPOFOL;  Surgeon: [name], MD;  Location: [facility] ENDOSCOPY;  Service: Gastroenterology;  Laterality: N/A;  . FOOT SURGERY Bilateral   . INCONTINENCE SURGERY  2009  . INTERSTIM IMPLANT PLACEMENT    . other     over active bladder  . OTHER SURGICAL HISTORY     bladder stimulator   . PARTIAL HYSTERECTOMY  03/1996   fibroids  . PORTA CATH INSERTION N/A 03/10/2019   Procedure: PORTA CATH INSERTION;  Surgeon: Algernon Huxley, MD;  Location: [facility];  Service: Cardiovascular;  Laterality: N/A;  . TONSILLECTOMY  2007    OB History   No obstetric history on file.      Home Medications    Prior to Admission medications   Medication Sig Start Date End Date Taking? Authorizing Provider  amoxicillin-clavulanate (AUGMENTIN) 875-125 MG tablet Take 1 tablet by mouth 2 (two) times daily. 10/30/20  Yes Tamre Cass G, DO  fluconazole (DIFLUCAN) 150 MG tablet Take 1 tablet (150 mg total) by mouth once for 1 dose. Repeat dose in 72 hours. 10/30/20 10/30/20 Yes Jasie Meleski G, DO  promethazine-dextromethorphan (PROMETHAZINE-DM) 6.25-15 MG/5ML syrup Take 5 mLs by mouth 4 (four) times daily as needed for cough. 10/30/20  Yes Goebel Hellums G, DO  acetaminophen (TYLENOL) 500 MG tablet Take 500 mg by mouth every 6 (six) hours as needed.  Patient not taking: No sig reported    [provider]  acyclovir (ZOVIRAX) 200 MG capsule Take by mouth. 06/04/20    [provider]  allopurinol (ZYLOPRIM) 100 MG tablet TAKE 1 TABLET(100 MG) BY MOUTH DAILY as needed 12/02/18   Glean Hess, MD  bisoprolol (ZEBETA) 5 MG tablet Take 5 mg by mouth daily. 07/05/20   [provider]  diclofenac sodium (VOLTAREN) 1 % GEL Apply 2 g topically 4 (four) times daily. 01/25/19   Gertie Baron, NP  diphenhydrAMINE (BENADRYL) 50 MG capsule Take 50 mg by mouth as needed.    [provider]  fexofenadine (ALLEGRA) 180 MG tablet TAKE 1 TABLET(180 MG) BY MOUTH DAILY 05/30/17   Glean Hess, MD  FLUoxetine (PROZAC) 40 MG capsule TAKE 1 CAPSULE EVERY DAY 05/09/20   Glean Hess, MD  fluticasone [facility]) 50 MCG/ACT nasal spray Place 2 sprays into both nostrils daily. 07/10/18   Glean Hess, MD  glucose blood test strip  09/04/15   [provider]  lactulose (CHRONULAC) 10 GM/15ML solution Take 20 g by mouth daily as needed.  Patient not taking: No sig reported 10/09/19   [provider]  lidocaine (LIDODERM) 5 % 1 patch daily. 06/04/20   [provider]  loperamide (IMODIUM) 2 MG capsule Take 1 capsule (  2 mg total) by mouth 4 (four) times daily as needed for diarrhea or loose stools. 10/02/20   Lequita Asal, MD  metFORMIN (GLUCOPHAGE-XR) 500 MG 24 hr tablet TAKE 1 TABLET EVERY DAY WITH BREAKFAST Patient not taking: Reported on 10/12/2020 07/12/20   Glean Hess, MD  methocarbamol (ROBAXIN) 500 MG tablet TAKE 1 TABLET FOUR TIMES DAILY. 07/12/20   Glean Hess, MD  MODERNA COVID-19 VACCINE 100 MCG/0.5ML injection  08/12/20   [provider]  Multiple Vitamin (MULTIVITAMIN) tablet Take 1 tablet by mouth daily.    [provider]  Multiple Vitamins-Minerals (HAIR/SKIN/NAILS) TABS Take 1 tablet by mouth daily.    [provider]  omeprazole (PRILOSEC) 40 MG capsule TAKE 1 CAPSULE EVERY DAY 09/12/20   Glean Hess, MD  promethazine (PHENERGAN) 25 MG tablet Take 1 tablet (25 mg  total) by mouth every 6 (six) hours as needed for nausea or vomiting. 10/02/20   Lequita Asal, MD  [facility] injection  07/23/20   [provider]  traZODone (DESYREL) 100 MG tablet Take 1 tablet (100 mg total) by mouth at bedtime. 08/09/20   Glean Hess, MD  montelukast (SINGULAIR) 10 MG tablet Take 1 tablet by mouth on the day before treatment and for 2 days after treatment. 12/16/19 08/16/20  Lequita Asal, MD    Family History Family History  Problem Relation Age of Onset  . Colon cancer Father   . Renal Disease Father   . Diabetes Mellitus II Father   . Melanoma Paternal Grandmother   . Breast cancer Maternal Aunt 8  . Anemia Mother   . Heart disease Mother   . Heart failure Mother   . Renal Disease Mother   . Congestive Heart Failure Mother   . Heart disease Maternal Uncle   . Throat cancer Maternal Uncle   . Lung cancer Maternal Uncle   . Liver disease Maternal Uncle   . Heart failure Maternal Uncle   . Hearing loss Son 43       Suicide     Social History Social History   Tobacco Use  . Smoking status: Former Smoker    Packs/day: 1.00    Years: 20.00    Pack years: 20.00    Types: Cigarettes    Quit date: 07/02/1991    Years since quitting: 29.3  . Smokeless tobacco: Never Used  Vaping Use  . Vaping Use: Never used  Substance Use Topics  . Alcohol use: Yes    Comment: occasionally  . Drug use: No     Allergies   Oxycodone-acetaminophen, Celebrex [celecoxib], Codeine, Plerixafor, Benadryl [diphenhydramine], Morphine, Ondansetron, and Tylenol [acetaminophen]   Review of Systems Review of Systems Per HPI  Physical Exam Triage Vital Signs ED Triage Vitals  Enc Vitals Group     BP 10/30/20 0922 133/66     Pulse Rate 10/30/20 0922 69     Resp 10/30/20 0922 20     Temp 10/30/20 0922 98.6 F (37 C)     Temp Source 10/30/20 0922 Oral     SpO2 10/30/20 0922 99 %     Weight 10/30/20 0920 157 lb (71.2 kg)     Height 10/30/20 0920 5'  2" (1.575 m)     Head Circumference --      Peak Flow --      Pain Score 10/30/20 0919 2     Pain Loc --      Pain Edu? --  Excl. in [place]? --    Updated Vital Signs BP 133/66 (BP Location: Right Arm)   Pulse 69   Temp 98.6 F (37 C) (Oral)   Resp 20   Ht 5' 2"  (1.575 m)   Wt 71.2 kg   SpO2 99%   BMI 28.72 kg/m   Visual Acuity Right Eye Distance:   Left Eye Distance:   Bilateral Distance:    Right Eye Near:   Left Eye Near:    Bilateral Near:     Physical Exam Vitals and nursing note reviewed.  Constitutional:      General: She is not in acute distress.    Appearance: Normal appearance. She is not ill-appearing.  HENT:     Head: Normocephalic and atraumatic.     Right Ear: Tympanic membrane normal.     Ears:     Comments: Left TM with effusion. Eyes:     General:        Right eye: No discharge.        Left eye: No discharge.     Conjunctiva/sclera: Conjunctivae normal.  Cardiovascular:     Rate and Rhythm: Normal rate and regular rhythm.  Pulmonary:     Effort: Pulmonary effort is normal.     Breath sounds: Normal breath sounds. No wheezing or rales.  Neurological:     Mental Status: She is alert.  Psychiatric:        Mood and Affect: Mood normal.        Behavior: Behavior normal.    UC Treatments / Results  Labs (all labs ordered are listed, but only abnormal results are displayed) Labs Reviewed  SARS CORONAVIRUS 2 (TAT 6-24 HRS)    EKG   Radiology No results found.  Procedures Procedures (including critical care time)  Medications Ordered in UC Medications - No data to display  Initial Impression / Assessment and Plan / UC Course  I have reviewed the triage vital signs and the nursing notes.  Pertinent labs & imaging results that were available during my care of the patient were reviewed by me and considered in my medical decision making (see chart for details).    65 year old female presents with respiratory symptoms.  Otitis media  with effusion noted on exam.  Patient immunocompromise.  Treating with Augmentin.  Promethazine DM for cough.  Final Clinical Impressions(s) / UC Diagnoses   Final diagnoses:  Left otitis media with effusion  Cough     Discharge Instructions     Medication as prescribed.  Take care  Dr. Lacinda Axon     ED Prescriptions    Medication Sig Dispense Auth. Provider   amoxicillin-clavulanate (AUGMENTIN) 875-125 MG tablet Take 1 tablet by mouth 2 (two) times daily. 14 tablet Manasi Dishon G, DO   promethazine-dextromethorphan (PROMETHAZINE-DM) 6.25-15 MG/5ML syrup Take 5 mLs by mouth 4 (four) times daily as needed for cough. 118 mL Shayn Madole G, DO   fluconazole (DIFLUCAN) 150 MG tablet Take 1 tablet (150 mg total) by mouth once for 1 dose. Repeat dose in 72 hours. 2 tablet Coral Spikes, DO     PDMP not reviewed this encounter.   Coral Spikes, [place] 10/30/20 [phone]

## 2020-10-30 NOTE — Telephone Encounter (Signed)
Pt came in this morning to question if she should go to urgent care due to COVID-19 related symptoms. Appts for today cancelled, will reschedule following urgent care results.

## 2020-10-30 NOTE — ED Triage Notes (Addendum)
Pt presents today with c/o of cough, productive cough, SOB. x 4 days. Denies fever. She has Multiple Myeloma and is being treated twice a month with chemotherapy. She request refill for cough medicine (name/dose unknown).

## 2020-10-30 NOTE — Discharge Instructions (Signed)
Medication as prescribed.  Take care  Dr. Friend Dorfman  

## 2020-11-02 ENCOUNTER — Other Ambulatory Visit: Payer: Self-pay

## 2020-11-02 ENCOUNTER — Encounter: Payer: Self-pay | Admitting: Nurse Practitioner

## 2020-11-02 ENCOUNTER — Ambulatory Visit: Payer: Medicare HMO

## 2020-11-02 ENCOUNTER — Other Ambulatory Visit: Payer: Self-pay | Admitting: *Deleted

## 2020-11-02 ENCOUNTER — Inpatient Hospital Stay: Payer: Medicare HMO | Attending: Nurse Practitioner

## 2020-11-02 ENCOUNTER — Inpatient Hospital Stay: Payer: Medicare HMO

## 2020-11-02 ENCOUNTER — Other Ambulatory Visit: Payer: Medicare HMO

## 2020-11-02 VITALS — BP 124/73 | HR 71 | Temp 96.0°F | Resp 18

## 2020-11-02 DIAGNOSIS — Z841 Family history of disorders of kidney and ureter: Secondary | ICD-10-CM | POA: Insufficient documentation

## 2020-11-02 DIAGNOSIS — Z822 Family history of deafness and hearing loss: Secondary | ICD-10-CM | POA: Insufficient documentation

## 2020-11-02 DIAGNOSIS — Z888 Allergy status to other drugs, medicaments and biological substances status: Secondary | ICD-10-CM | POA: Diagnosis not present

## 2020-11-02 DIAGNOSIS — E1122 Type 2 diabetes mellitus with diabetic chronic kidney disease: Secondary | ICD-10-CM | POA: Insufficient documentation

## 2020-11-02 DIAGNOSIS — Z800 Family history of malignant neoplasm of digestive organs: Secondary | ICD-10-CM | POA: Insufficient documentation

## 2020-11-02 DIAGNOSIS — Z833 Family history of diabetes mellitus: Secondary | ICD-10-CM | POA: Insufficient documentation

## 2020-11-02 DIAGNOSIS — T451X5A Adverse effect of antineoplastic and immunosuppressive drugs, initial encounter: Secondary | ICD-10-CM | POA: Insufficient documentation

## 2020-11-02 DIAGNOSIS — Z801 Family history of malignant neoplasm of trachea, bronchus and lung: Secondary | ICD-10-CM | POA: Insufficient documentation

## 2020-11-02 DIAGNOSIS — R110 Nausea: Secondary | ICD-10-CM | POA: Insufficient documentation

## 2020-11-02 DIAGNOSIS — R7881 Bacteremia: Secondary | ICD-10-CM | POA: Diagnosis not present

## 2020-11-02 DIAGNOSIS — N179 Acute kidney failure, unspecified: Secondary | ICD-10-CM | POA: Diagnosis not present

## 2020-11-02 DIAGNOSIS — Z832 Family history of diseases of the blood and blood-forming organs and certain disorders involving the immune mechanism: Secondary | ICD-10-CM | POA: Insufficient documentation

## 2020-11-02 DIAGNOSIS — N183 Chronic kidney disease, stage 3 unspecified: Secondary | ICD-10-CM | POA: Insufficient documentation

## 2020-11-02 DIAGNOSIS — C9000 Multiple myeloma not having achieved remission: Secondary | ICD-10-CM | POA: Diagnosis not present

## 2020-11-02 DIAGNOSIS — Z803 Family history of malignant neoplasm of breast: Secondary | ICD-10-CM | POA: Insufficient documentation

## 2020-11-02 DIAGNOSIS — Z9484 Stem cells transplant status: Secondary | ICD-10-CM | POA: Diagnosis not present

## 2020-11-02 DIAGNOSIS — R339 Retention of urine, unspecified: Secondary | ICD-10-CM | POA: Diagnosis not present

## 2020-11-02 DIAGNOSIS — Z885 Allergy status to narcotic agent status: Secondary | ICD-10-CM | POA: Insufficient documentation

## 2020-11-02 DIAGNOSIS — I350 Nonrheumatic aortic (valve) stenosis: Secondary | ICD-10-CM | POA: Diagnosis not present

## 2020-11-02 DIAGNOSIS — Z5111 Encounter for antineoplastic chemotherapy: Secondary | ICD-10-CM

## 2020-11-02 DIAGNOSIS — Z87891 Personal history of nicotine dependence: Secondary | ICD-10-CM | POA: Insufficient documentation

## 2020-11-02 DIAGNOSIS — Z808 Family history of malignant neoplasm of other organs or systems: Secondary | ICD-10-CM | POA: Insufficient documentation

## 2020-11-02 DIAGNOSIS — Z79899 Other long term (current) drug therapy: Secondary | ICD-10-CM | POA: Diagnosis not present

## 2020-11-02 DIAGNOSIS — Z9049 Acquired absence of other specified parts of digestive tract: Secondary | ICD-10-CM | POA: Insufficient documentation

## 2020-11-02 DIAGNOSIS — D801 Nonfamilial hypogammaglobulinemia: Secondary | ICD-10-CM | POA: Insufficient documentation

## 2020-11-02 DIAGNOSIS — E538 Deficiency of other specified B group vitamins: Secondary | ICD-10-CM | POA: Insufficient documentation

## 2020-11-02 DIAGNOSIS — Z8379 Family history of other diseases of the digestive system: Secondary | ICD-10-CM | POA: Insufficient documentation

## 2020-11-02 DIAGNOSIS — D709 Neutropenia, unspecified: Secondary | ICD-10-CM | POA: Diagnosis not present

## 2020-11-02 DIAGNOSIS — R197 Diarrhea, unspecified: Secondary | ICD-10-CM | POA: Insufficient documentation

## 2020-11-02 DIAGNOSIS — D649 Anemia, unspecified: Secondary | ICD-10-CM

## 2020-11-02 DIAGNOSIS — Z9481 Bone marrow transplant status: Secondary | ICD-10-CM | POA: Insufficient documentation

## 2020-11-02 DIAGNOSIS — Z5112 Encounter for antineoplastic immunotherapy: Secondary | ICD-10-CM | POA: Insufficient documentation

## 2020-11-02 DIAGNOSIS — Z886 Allergy status to analgesic agent status: Secondary | ICD-10-CM | POA: Diagnosis not present

## 2020-11-02 DIAGNOSIS — Z8249 Family history of ischemic heart disease and other diseases of the circulatory system: Secondary | ICD-10-CM | POA: Insufficient documentation

## 2020-11-02 LAB — CBC WITH DIFFERENTIAL/PLATELET
Abs Immature Granulocytes: 0.03 10*3/uL (ref 0.00–0.07)
Basophils Absolute: 0 10*3/uL (ref 0.0–0.1)
Basophils Relative: 0 %
Eosinophils Absolute: 0.1 10*3/uL (ref 0.0–0.5)
Eosinophils Relative: 2 %
HCT: 30.9 % — ABNORMAL LOW (ref 36.0–46.0)
Hemoglobin: 10 g/dL — ABNORMAL LOW (ref 12.0–15.0)
Immature Granulocytes: 1 %
Lymphocytes Relative: 18 %
Lymphs Abs: 1 10*3/uL (ref 0.7–4.0)
MCH: 26 pg (ref 26.0–34.0)
MCHC: 32.4 g/dL (ref 30.0–36.0)
MCV: 80.3 fL (ref 80.0–100.0)
Monocytes Absolute: 0.4 10*3/uL (ref 0.1–1.0)
Monocytes Relative: 7 %
Neutro Abs: 4.1 10*3/uL (ref 1.7–7.7)
Neutrophils Relative %: 72 %
Platelets: 116 10*3/uL — ABNORMAL LOW (ref 150–400)
RBC: 3.85 MIL/uL — ABNORMAL LOW (ref 3.87–5.11)
RDW: 15.8 % — ABNORMAL HIGH (ref 11.5–15.5)
WBC: 5.6 10*3/uL (ref 4.0–10.5)
nRBC: 0 % (ref 0.0–0.2)

## 2020-11-02 LAB — COMPREHENSIVE METABOLIC PANEL
ALT: 28 U/L (ref 0–44)
AST: 29 U/L (ref 15–41)
Albumin: 4.2 g/dL (ref 3.5–5.0)
Alkaline Phosphatase: 100 U/L (ref 38–126)
Anion gap: 8 (ref 5–15)
BUN: 19 mg/dL (ref 8–23)
CO2: 21 mmol/L — ABNORMAL LOW (ref 22–32)
Calcium: 8.7 mg/dL — ABNORMAL LOW (ref 8.9–10.3)
Chloride: 106 mmol/L (ref 98–111)
Creatinine, Ser: 1.01 mg/dL — ABNORMAL HIGH (ref 0.44–1.00)
GFR, Estimated: >60 mL/min (ref >60)
Glucose, Bld: 146 mg/dL — ABNORMAL HIGH (ref 70–99)
Potassium: 4.2 mmol/L (ref 3.5–5.1)
Sodium: 135 mmol/L (ref 135–145)
Total Bilirubin: 0.2 mg/dL — ABNORMAL LOW (ref 0.3–1.2)
Total Protein: 6.6 g/dL (ref 6.5–8.1)

## 2020-11-02 LAB — MAGNESIUM: Magnesium: 1.1 mg/dL — ABNORMAL LOW (ref 1.7–2.4)

## 2020-11-02 MED ORDER — MAGNESIUM SULFATE 2 GM/50ML IV SOLN
2.0000 g | Freq: Once | INTRAVENOUS | Status: AC
Start: 2020-11-02 — End: 2020-11-02
  Administered 2020-11-02: 2 g via INTRAVENOUS
  Filled 2020-11-02: qty 50

## 2020-11-02 MED ORDER — IRON SUCROSE 20 MG/ML IV SOLN
200.0000 mg | Freq: Once | INTRAVENOUS | Status: AC
Start: 2020-11-02 — End: 2020-11-02
  Administered 2020-11-02: 200 mg via INTRAVENOUS
  Filled 2020-11-02: qty 10

## 2020-11-02 MED ORDER — DIPHENOXYLATE-ATROPINE 2.5-0.025 MG PO TABS: 2.0000 | ORAL_TABLET | Freq: Four times a day (QID) | ORAL | 0 refills | Status: DC | PRN

## 2020-11-02 MED ORDER — SODIUM CHLORIDE 0.9 % IV SOLN
25.0000 mg | Freq: Once | INTRAVENOUS | Status: AC
  Administered 2020-11-02: 25 mg via INTRAVENOUS
  Filled 2020-11-02: qty 1

## 2020-11-02 MED ORDER — SODIUM CHLORIDE 0.9 % IV SOLN: 200.0000 mg | Freq: Once | INTRAVENOUS | Status: DC

## 2020-11-02 MED ORDER — SODIUM CHLORIDE 0.9 % IV SOLN
Freq: Once | INTRAVENOUS | Status: AC
  Filled 2020-11-02: qty 250

## 2020-11-02 MED ORDER — SODIUM CHLORIDE 0.9 % IV SOLN: 6.0000 g | Freq: Once | INTRAVENOUS | Status: DC

## 2020-11-02 MED ORDER — MAGNESIUM SULFATE 4 GM/100ML IV SOLN
4.0000 g | Freq: Once | INTRAVENOUS | Status: AC
  Administered 2020-11-02: 4 g via INTRAVENOUS
  Filled 2020-11-02: qty 100

## 2020-11-02 MED ORDER — HEPARIN SOD (PORK) LOCK FLUSH 100 UNIT/ML IV SOLN
500.0000 [IU] | Freq: Once | INTRAVENOUS | Status: AC
  Administered 2020-11-02: 500 [IU] via INTRAVENOUS
  Filled 2020-11-02: qty 5

## 2020-11-02 NOTE — Telephone Encounter (Signed)
Sarah [name] - Patient requested RF on Lomotil. Please send to pharmacy

## 2020-11-03 MED ORDER — LOPERAMIDE HCL 2 MG PO CAPS: 2.0000 mg | ORAL_CAPSULE | Freq: Four times a day (QID) | ORAL | 3 refills | Status: DC | PRN

## 2020-11-09 ENCOUNTER — Other Ambulatory Visit: Payer: Self-pay

## 2020-11-09 ENCOUNTER — Inpatient Hospital Stay: Payer: Medicare HMO | Admitting: Nurse Practitioner

## 2020-11-09 ENCOUNTER — Inpatient Hospital Stay: Payer: Medicare HMO

## 2020-11-09 ENCOUNTER — Ambulatory Visit
Admission: RE | Admit: 2020-11-09 | Discharge: 2020-11-09 | Disposition: A | Discharge status: Home / Self Care | Payer: Medicare HMO | Attending: Nurse Practitioner | Admitting: Nurse Practitioner

## 2020-11-09 ENCOUNTER — Encounter: Payer: Self-pay | Admitting: Nurse Practitioner

## 2020-11-09 ENCOUNTER — Ambulatory Visit: Payer: Medicare HMO

## 2020-11-09 ENCOUNTER — Ambulatory Visit
Admission: RE | Admit: 2020-11-09 | Discharge: 2020-11-09 | Disposition: A | Discharge status: Home / Self Care | Payer: Medicare HMO | Source: Ambulatory Visit | Attending: Nurse Practitioner | Admitting: Nurse Practitioner

## 2020-11-09 VITALS — BP 114/75 | HR 71 | Temp 97.8°F | Resp 20 | Wt 156.1 lb

## 2020-11-09 DIAGNOSIS — C9000 Multiple myeloma not having achieved remission: Secondary | ICD-10-CM

## 2020-11-09 DIAGNOSIS — N179 Acute kidney failure, unspecified: Secondary | ICD-10-CM | POA: Diagnosis not present

## 2020-11-09 DIAGNOSIS — D649 Anemia, unspecified: Secondary | ICD-10-CM

## 2020-11-09 DIAGNOSIS — E538 Deficiency of other specified B group vitamins: Secondary | ICD-10-CM

## 2020-11-09 DIAGNOSIS — Z5112 Encounter for antineoplastic immunotherapy: Secondary | ICD-10-CM | POA: Diagnosis not present

## 2020-11-09 DIAGNOSIS — R197 Diarrhea, unspecified: Secondary | ICD-10-CM | POA: Diagnosis not present

## 2020-11-09 DIAGNOSIS — Z5111 Encounter for antineoplastic chemotherapy: Secondary | ICD-10-CM

## 2020-11-09 DIAGNOSIS — D709 Neutropenia, unspecified: Secondary | ICD-10-CM | POA: Diagnosis not present

## 2020-11-09 DIAGNOSIS — D801 Nonfamilial hypogammaglobulinemia: Secondary | ICD-10-CM | POA: Diagnosis not present

## 2020-11-09 DIAGNOSIS — I350 Nonrheumatic aortic (valve) stenosis: Secondary | ICD-10-CM | POA: Diagnosis not present

## 2020-11-09 DIAGNOSIS — R110 Nausea: Secondary | ICD-10-CM | POA: Diagnosis not present

## 2020-11-09 DIAGNOSIS — E118 Type 2 diabetes mellitus with unspecified complications: Secondary | ICD-10-CM

## 2020-11-09 DIAGNOSIS — R059 Cough, unspecified: Secondary | ICD-10-CM | POA: Diagnosis not present

## 2020-11-09 LAB — MAGNESIUM: Magnesium: 1.1 mg/dL — ABNORMAL LOW (ref 1.7–2.4)

## 2020-11-09 LAB — CBC WITH DIFFERENTIAL/PLATELET
Abs Immature Granulocytes: 0.02 10*3/uL (ref 0.00–0.07)
Basophils Absolute: 0 10*3/uL (ref 0.0–0.1)
Basophils Relative: 0 %
Eosinophils Absolute: 0.1 10*3/uL (ref 0.0–0.5)
Eosinophils Relative: 1 %
HCT: 33.3 % — ABNORMAL LOW (ref 36.0–46.0)
Hemoglobin: 10.6 g/dL — ABNORMAL LOW (ref 12.0–15.0)
Immature Granulocytes: 0 %
Lymphocytes Relative: 19 %
Lymphs Abs: 0.9 10*3/uL (ref 0.7–4.0)
MCH: 25.7 pg — ABNORMAL LOW (ref 26.0–34.0)
MCHC: 31.8 g/dL (ref 30.0–36.0)
MCV: 80.8 fL (ref 80.0–100.0)
Monocytes Absolute: 0.5 10*3/uL (ref 0.1–1.0)
Monocytes Relative: 9 %
Neutro Abs: 3.4 10*3/uL (ref 1.7–7.7)
Neutrophils Relative %: 71 %
Platelets: 129 10*3/uL — ABNORMAL LOW (ref 150–400)
RBC: 4.12 MIL/uL (ref 3.87–5.11)
RDW: 16.7 % — ABNORMAL HIGH (ref 11.5–15.5)
WBC: 4.9 10*3/uL (ref 4.0–10.5)
nRBC: 0 % (ref 0.0–0.2)

## 2020-11-09 LAB — COMPREHENSIVE METABOLIC PANEL
ALT: 29 U/L (ref 0–44)
AST: 34 U/L (ref 15–41)
Albumin: 4.2 g/dL (ref 3.5–5.0)
Alkaline Phosphatase: 96 U/L (ref 38–126)
Anion gap: 9 (ref 5–15)
BUN: 17 mg/dL (ref 8–23)
CO2: 22 mmol/L (ref 22–32)
Calcium: 8.8 mg/dL — ABNORMAL LOW (ref 8.9–10.3)
Chloride: 105 mmol/L (ref 98–111)
Creatinine, Ser: 1.07 mg/dL — ABNORMAL HIGH (ref 0.44–1.00)
GFR, Estimated: 58 mL/min — ABNORMAL LOW (ref >60)
Glucose, Bld: 139 mg/dL — ABNORMAL HIGH (ref 70–99)
Potassium: 4.1 mmol/L (ref 3.5–5.1)
Sodium: 136 mmol/L (ref 135–145)
Total Bilirubin: 0.6 mg/dL (ref 0.3–1.2)
Total Protein: 6.7 g/dL (ref 6.5–8.1)

## 2020-11-09 LAB — VITAMIN B12: Vitamin B-12: 876 pg/mL (ref 180–914)

## 2020-11-09 LAB — HEMOGLOBIN A1C
Hgb A1c MFr Bld: 6.6 % — ABNORMAL HIGH (ref 4.8–5.6)
Mean Plasma Glucose: 142.72 mg/dL

## 2020-11-09 MED ORDER — SODIUM CHLORIDE 0.9 % IV SOLN
25.0000 mg | Freq: Once | INTRAVENOUS | Status: AC
  Administered 2020-11-09: 25 mg via INTRAVENOUS
  Filled 2020-11-09: qty 1

## 2020-11-09 MED ORDER — SODIUM CHLORIDE 0.9 % IV SOLN
Freq: Once | INTRAVENOUS | Status: DC
  Filled 2020-11-09: qty 250

## 2020-11-09 MED ORDER — BORTEZOMIB CHEMO IV INJECTION 3.5 MG (1MG/ML-GENERIC)
2.0000 mg | Freq: Once | INTRAVENOUS | Status: AC
  Administered 2020-11-09: 2 mg via INTRAVENOUS
  Filled 2020-11-09: qty 2

## 2020-11-09 MED ORDER — DIPHENOXYLATE-ATROPINE 2.5-0.025 MG PO TABS: 2.0000 | ORAL_TABLET | Freq: Four times a day (QID) | ORAL | 0 refills | Status: DC | PRN

## 2020-11-09 MED ORDER — PROMETHAZINE-DM 6.25-15 MG/5ML PO SYRP: 5.0000 mL | ORAL_SOLUTION | Freq: Four times a day (QID) | ORAL | 0 refills | Status: DC | PRN

## 2020-11-09 MED ORDER — MAGNESIUM SULFATE 4 GM/100ML IV SOLN
4.0000 g | Freq: Once | INTRAVENOUS | Status: AC
  Administered 2020-11-09: 4 g via INTRAVENOUS
  Filled 2020-11-09: qty 100

## 2020-11-09 MED ORDER — SODIUM CHLORIDE 0.9% FLUSH
10.0000 mL | Freq: Once | INTRAVENOUS | Status: AC | PRN
  Administered 2020-11-09: 10 mL
  Filled 2020-11-09: qty 10

## 2020-11-09 MED ORDER — HEPARIN SOD (PORK) LOCK FLUSH 100 UNIT/ML IV SOLN
500.0000 [IU] | Freq: Once | INTRAVENOUS | Status: AC | PRN
  Administered 2020-11-09: 500 [IU]
  Filled 2020-11-09: qty 5

## 2020-11-09 MED ORDER — MAGNESIUM SULFATE 2 GM/50ML IV SOLN
2.0000 g | Freq: Once | INTRAVENOUS | Status: AC
  Administered 2020-11-09: 2 g via INTRAVENOUS
  Filled 2020-11-09: qty 50

## 2020-11-09 MED ORDER — SODIUM CHLORIDE 0.9 % IV SOLN: 6.0000 g | Freq: Once | INTRAVENOUS | Status: DC

## 2020-11-09 MED ORDER — CYANOCOBALAMIN 1000 MCG/ML IJ SOLN
1000.0000 ug | Freq: Once | INTRAMUSCULAR | Status: AC
Start: 2020-11-09 — End: 2020-11-09
  Administered 2020-11-09: 1000 ug via INTRAMUSCULAR
  Filled 2020-11-09: qty 1

## 2020-11-09 NOTE — Progress Notes (Signed)
She needs magnesium. Is she scheduled before 11/23/20?  Faythe Casa, NP 11/09/2020 10:34 AM

## 2020-11-09 NOTE — Patient Instructions (Signed)
[facility]  Discharge Instructions: Thank you for choosing [facility] to provide your oncology and hematology care.  If you have a lab appointment with the Cancer Center, please go directly to the Cancer Center and check in at the registration area.  Wear comfortable clothing and clothing appropriate for easy access to any Portacath or PICC line.   We strive to give you quality time with your provider. You may need to reschedule your appointment if you arrive late (15 or more minutes).  Arriving late affects you and other patients whose appointments are after yours.  Also, if you miss three or more appointments without notifying the office, you may be dismissed from the clinic at the provider's discretion.      For prescription refill requests, have your pharmacy contact our office and allow 72 hours for refills to be completed.        To help prevent nausea and vomiting after your treatment, we encourage you to take your nausea medication as directed.  BELOW ARE SYMPTOMS THAT SHOULD BE REPORTED IMMEDIATELY: *FEVER GREATER THAN 100.4 F (38 C) OR HIGHER *CHILLS OR SWEATING *NAUSEA AND VOMITING THAT IS NOT CONTROLLED WITH YOUR NAUSEA MEDICATION *UNUSUAL SHORTNESS OF BREATH *UNUSUAL BRUISING OR BLEEDING *URINARY PROBLEMS (pain or burning when urinating, or frequent urination) *BOWEL PROBLEMS (unusual diarrhea, constipation, pain near the anus) TENDERNESS IN MOUTH AND THROAT WITH OR WITHOUT PRESENCE OF ULCERS (sore throat, sores in mouth, or a toothache) UNUSUAL RASH, SWELLING OR PAIN  UNUSUAL VAGINAL DISCHARGE OR ITCHING   Items with * indicate a potential emergency and should be followed up as soon as possible or go to the Emergency Department if any problems should occur.  Please show the CHEMOTHERAPY ALERT CARD or IMMUNOTHERAPY ALERT CARD at check-in to the Emergency Department and triage nurse.  Should you have questions after your visit or  need to cancel or reschedule your appointment, please contact [facility]  [phone] and follow the prompts.  Office hours are 8:00 a.m. to 4:30 p.m. Monday - Friday. Please note that voicemails left after 4:00 p.m. may not be returned until the following business day.  We are closed weekends and major holidays. You have access to a nurse at all times for urgent questions. Please call the main number to the clinic [phone] and follow the prompts.  For any non-urgent questions, you may also contact your provider using MyChart. We now offer e-Visits for anyone 18 and older to request care online for non-urgent symptoms. For details visit [email or web address].   Also download the MyChart app! Go to the app store, search "MyChart", open the app, select [facility], and log in with your MyChart username and password.  Due to Covid, a mask is required upon entering the hospital/clinic. If you do not have a mask, one will be given to you upon arrival. For doctor visits, patients may have 1 support person aged 18 or older with them. For treatment visits, patients cannot have anyone with them due to current Covid guidelines and our immunocompromised population.  

## 2020-11-09 NOTE — Progress Notes (Signed)
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

## 2020-11-09 NOTE — Progress Notes (Signed)
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  Oncology History: Nkechi Linehan is a 65 y.o. 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   Initial bone marrow revealed 80% plasma cells.  Lambda free light chains were 1340.  She had nephrotic range proteinuria.  She initially underwent induction with RVD.  Revlimid maintenance was discontinued on 01/21/2017 secondary to intolerance.     Bone marrow aspirate and biopsy on 07/19/2019 revealed a normocellular marrow with but increased lambda-restricted plasma cells (9% aspirate, 40% CD138 immunohistochemistry).  Findings were consistent with recurrent plasma cell myeloma.  Flow cytometry revealed no monoclonal B-cell or phenotypically aberrant T-cell population. Cytogenetics were 56, XX (normal).  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   M-spike has been followed:  0 on 04/02/2016 - 06/23/2019; 0.2 on 09/02/2017, 0.1 on 11/25/2019, 0.1 on 01/10/2020, 0.2 on 02/07/2020, 0.1 on 03/07/2020, 0 on 04/10/2020, and 06/19/2020.   Lambda light chains have been followed: 22.2 (ratio 0.56) on 07/03/2017, 30.8 (ratio 0.78) on 09/02/2017, 36.9 (ratio 0.40) on 10/21/2017, 37.4 (ratio 0.41) on 12/16/2017, 70.7(ratio 0.31)  on 02/17/2018, 64.2 (ratio 0.27) on 04/07/2018, 78.9 (ratio 0.18) on 05/26/2018, 128.8 (ratio 0.17) on 08/06/2018, 181.5 (ratio 0.13) on 10/08/2018, 130.9 (ratio 0.13) on 10/20/2018, 160.7 (ratio 0.10) on 12/09/2018, 236.6 (ratio 0.07) on 02/01/2019, 363.6 (ratio 0.04) on 03/22/2019,  404.8 (ratio 0.04) on 04/05/2019, 420.7 (ratio 0.03) on 05/24/2019, 573.4 (ratio 0.03) on 06/23/2019, 451.05 (ratio 0.02) on 08/20/2019, 47.2 (ratio 0.13) on 10/25/2019, 22.4 (ratio 0.21) on 11/25/2019, 16.5 (ratio 0.33) on 01/10/2020, 14.6 (ratio 0.34) on 02/07/2020, 13.1 (ratio 0.31) on 03/07/2020, 10.1 (ratio 0.38) on 04/10/2020, and 9.5 (ratio 0.21) on 06/19/2020.   24 hour UPEP on 06/03/2019 revealed kappa free light chains 95.76, lambda free light chains 1,260.71, and ratio 0.08.  24 hour UPEP on 08/23/2019 revealed total protein of 782 mg/24 hrs with lambda free light chains 1,084.16 mg/L and ratio of 0.10 (1.03-31.76).  M spike in urine was 46.1% (361 mg/24 hrs).    Bone survey on 04/08/2016 and 05/28/2017 revealed no definite lytic lesion seen in the visualized skeleton.  Bone survey on 11/19/2018 revealed no suspicious lucent lesions and no acute bony abnormality.  PET scan on 07/12/2019 revealed no focal metabolic activity to suggest active myeloma within the skeleton. There were no lytic lesions identified on the CT portion of the exam or soft tissue plasmacytomas. There was no evidence of multiple myeloma.    Pretreatment RBC phenotype on 09/23/2019 was positive for C, e, DUFFY B, KIDD B, M, S, and s antigen; negative for c, E, KELL, DUFFY A, KIDD A, and N antigen.    She received 6 cycles of daratumumab and hyaluronidase-fihj, Pomalyst, and Decadron (DPd) (09/27/2019 - 10/25/2019; 12/09/2019 - 03/13/2020).  Cycle #1 was complicated by fever and neutropenia requiring admission.  Cycle #2 was complicated by pneumonia requiring admission.  Cycle #6 was complicated with an ER evaluation for an elevated lactic acid.   She is day 127 s/p second autologous stem cell transplant at [facility] on 05/10/2020.  She underwent  conditioning with melphalan 140 mg/m2.    She is s/p week #3 Velcade maintenance (began 08/31/2020 - 09/28/2020).  She has no increase in neuropathy.  She has severe aortic stenosis.   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 Cardiology is following for a possible TAVR in the future.  Echo on 07/05/2020 revealed moderate to severe aortic stenosis with an EF of 50-55%.  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 Zometa was discontinued in 01/2017.  She has chronic nausea on Phenergan.   She has B12 deficiency.  B12 was 254 on 04/09/2017, 295 on 08/20/2018, and 391 on 10/08/2018.  She was on oral B12.  She received B12 monthly (last 09/14/2020).  Folate was 12.1 on 01/10/2020.    She has iron deficiency.  Ferritin was 32 on 07/01/2019.  She received Venofer on 07/15/2019 and 07/22/2019.   She has hypogammaglobulinemia.  IgG was 245 on 11/25/2019.  She received monthly IVIG (07/22/021 - 03/22/2020).  She received IVIG 400 mg/kg on 01/20/2020, 200 mg/kg on 02/17/2020, and 300 mg/kg on 03/22/2020.  IVIG on 93/81/0175 was complicated by acute renal failure. IgG trough level was 418 on 02/17/2020.  She was admitted to [facility] from 10/15/2019 - 10/20/2019 with fever and neutropenia.  Cultures were negative.  CXR was negative. She received broad spectrum antibiotics and daily Granix.  She received IVF for acute renal insufficiency due to diarrhea and dehydration.  Creatine was 1.66 on admission and 1.12 on discharge.   She was admitted to [facility] from 03/01/2020 - 03/05/2020 with fever and neutropenia. CXR revealed no active cardiopulmonary disease. Chest CT with contrast revealed no acute intrathoracic pathology. There were findings which could be suggestive of prior granulomatous disease. She was treated with Cefepime and Vancomycin, then switched to ciprofloxacin on 03/03/2020.   The patient was admitted to [facility] from 05/08/2020 - 06/04/2020 for autologous bone marrow transplant.   She received conditioning with melphalan 140 mg/m2.  Bone marrow transplant was on 05/10/2020. The patient developed  fevers daily from 05/19/2020 - 05/24/2020. Blood cultures were + for strept sanguis bacteremia.  She was treated cefepime, vancomycin, and solumedrol for possible engraftment syndrome. Cefepime was switched to ceftriaxone; she completed 2 weeks of ceftriaxone on 06/03/2020. She had chemotherapy induced diarrhea.  She experienced urinary retention.  She received the [place] COVID-19 vaccine on 09/20/2019 and 10/09/2019. She received the Booster on 08/10/2020. She received the tetanus shot on 08/20/2020. She received the shingles vaccine recently.  HPI: Patient returns to clinic for further evaluation and consideration of bortezomib. She feels well. Is concerned that her magnesium is persistently low. Energy is improving though. She continues to need supplemental fluids and magnesium in between treatments. Some diarrhea persists but improved. Denies any neurologic complaints. Denies recent fevers or illnesses. Denies any easy bleeding or bruising. No melena or hematochezia. No pica or restless leg. Reports good appetite and denies weight loss. Denies chest pain. Denies any nausea, vomiting, constipation, or diarrhea. Denies urinary complaints. Patient offers no further specific complaints today.  Past Medical History:  Diagnosis Date   Abnormal stress test 02/14/2016   Overview:  Added automatically from request for surgery [id number]   Anemia    Anxiety    Arthritis    Bicuspid aortic valve    CHF (congestive heart failure) (HCC)    CKD (chronic kidney disease) stage 3, GFR 30-59 ml/min (HCC)    Depression  Diabetes mellitus ([place])    Dizziness    Fatty liver    Frequent falls    GERD (gastroesophageal reflux disease)    Gout    Heart murmur    History of blood transfusion    History of bone marrow transplant ([place])    History of uterine fibroid    Hx of cardiac catheterization 06/05/2016   Overview:  Normal coronaries 2017   Hypertension    Hypomagnesemia    Multiple myeloma ([place])     Personal history of chemotherapy    Renal cyst     Past Surgical History:  Procedure Laterality Date   ABDOMINAL HYSTERECTOMY     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   CARDIAC ELECTROPHYSIOLOGY MAPPING AND ABLATION     CARPAL TUNNEL RELEASE Bilateral    CHOLECYSTECTOMY  2008   COLONOSCOPY WITH PROPOFOL N/A 05/07/2017   Procedure: COLONOSCOPY WITH PROPOFOL;  Surgeon: [name], MD;  Location: [facility] ENDOSCOPY;  Service: Gastroenterology;  Laterality: N/A;   ESOPHAGOGASTRODUODENOSCOPY (EGD) WITH PROPOFOL N/A 05/07/2017   Procedure: ESOPHAGOGASTRODUODENOSCOPY (EGD) WITH PROPOFOL;  Surgeon: [name], MD;  Location: [facility] ENDOSCOPY;  Service: Gastroenterology;  Laterality: N/A;   FOOT SURGERY Bilateral    INCONTINENCE SURGERY  2009   INTERSTIM IMPLANT PLACEMENT     other     over active bladder   OTHER SURGICAL HISTORY     bladder stimulator    PARTIAL HYSTERECTOMY  03/1996   fibroids   PORTA CATH INSERTION N/A 03/10/2019   Procedure: PORTA CATH INSERTION;  Surgeon: Algernon Huxley, MD;  Location: [facility] CV LAB;  Service: Cardiovascular;  Laterality: N/A;   TONSILLECTOMY  2007    Family History  Problem Relation Age of Onset   Colon cancer Father    Renal Disease Father    Diabetes Mellitus II Father    Melanoma Paternal Grandmother    Breast cancer Maternal Aunt 4   Anemia Mother    Heart disease Mother    Heart failure Mother    Renal Disease Mother    Congestive Heart Failure Mother    Heart disease Maternal Uncle    Throat cancer Maternal Uncle    Lung cancer Maternal Uncle    Liver disease Maternal Uncle    Heart failure Maternal Uncle    Hearing loss Son 31       Suicide     Social History:  reports that she quit smoking about 29 years ago. Her smoking use included cigarettes. She has a 20.00 pack-year smoking history. She has never used smokeless tobacco. She reports current alcohol use. She reports that she does not use drugs. Patient has not had ETOH in several  months. She is on disability. She notes exposure to perchloroethylene [facility]).  She lives much of her adult life in [place].  She was married with 2 sons.  Her husband passed away.  Her 2 sons took their own lives (age 16 and 2).  She worked in [employer] in [employer].  She went back to school and earned an [employer] and works for [employer] for several years. She lives with her sisters in [place]. She has two dogs, Harley and Hominy. The patient is alone today.   Allergies:  Allergies  Allergen Reactions   Oxycodone-Acetaminophen Anaphylaxis    Swelling and rash   Celebrex [Celecoxib] Diarrhea   Codeine    Plerixafor     In 2016 during ASCT collection patient developed fever to 103.41F and  required hospitalization   Benadryl [Diphenhydramine] Palpitations   Morphine Itching and Rash   Ondansetron Diarrhea   Tylenol [Acetaminophen] Itching and Rash    Current Medications: Current Outpatient Medications  Medication Sig Dispense Refill   acyclovir (ZOVIRAX) 200 MG capsule Take by mouth.     allopurinol (ZYLOPRIM) 100 MG tablet TAKE 1 TABLET(100 MG) BY MOUTH DAILY as needed 90 tablet 1   amoxicillin-clavulanate (AUGMENTIN) 875-125 MG tablet Take 1 tablet by mouth 2 (two) times daily. 14 tablet 0   bisoprolol (ZEBETA) 5 MG tablet Take 5 mg by mouth daily.     diclofenac sodium (VOLTAREN) 1 % GEL Apply 2 g topically 4 (four) times daily. 100 g 1   diphenhydrAMINE (BENADRYL) 50 MG capsule Take 50 mg by mouth as needed.     diphenoxylate-atropine (LOMOTIL) 2.5-0.025 MG tablet Take 2 tablets by mouth 4 (four) times daily as needed for diarrhea or loose stools. 90 tablet 0   fexofenadine (ALLEGRA) 180 MG tablet TAKE 1 TABLET(180 MG) BY MOUTH DAILY 30 tablet 5   FLUoxetine (PROZAC) 40 MG capsule TAKE 1 CAPSULE EVERY DAY 90 capsule 1   fluticasone (FLONASE) 50 MCG/ACT nasal spray Place 2 sprays into both nostrils daily. 16 g 2   glucose blood test strip      lidocaine  (LIDODERM) 5 % 1 patch daily.     loperamide (IMODIUM) 2 MG capsule Take 1 capsule (2 mg total) by mouth 4 (four) times daily as needed for diarrhea or loose stools. 120 capsule 3   methocarbamol (ROBAXIN) 500 MG tablet TAKE 1 TABLET FOUR TIMES DAILY. 120 tablet 0   MODERNA COVID-19 VACCINE 100 MCG/0.5ML injection      Multiple Vitamin (MULTIVITAMIN) tablet Take 1 tablet by mouth daily.     Multiple Vitamins-Minerals (HAIR/SKIN/NAILS) TABS Take 1 tablet by mouth daily.     omeprazole (PRILOSEC) 40 MG capsule TAKE 1 CAPSULE EVERY DAY 90 capsule 0   promethazine (PHENERGAN) 25 MG tablet Take 1 tablet (25 mg total) by mouth every 6 (six) hours as needed for nausea or vomiting. 90 tablet 0   promethazine-dextromethorphan (PROMETHAZINE-DM) 6.25-15 MG/5ML syrup Take 5 mLs by mouth 4 (four) times daily as needed for cough. 118 mL 0   SHINGRIX injection      traZODone (DESYREL) 100 MG tablet Take 1 tablet (100 mg total) by mouth at bedtime. 90 tablet 1   acetaminophen (TYLENOL) 500 MG tablet Take 500 mg by mouth every 6 (six) hours as needed.  (Patient not taking: No sig reported)     lactulose (CHRONULAC) 10 GM/15ML solution Take 20 g by mouth daily as needed.  (Patient not taking: No sig reported)     metFORMIN (GLUCOPHAGE-XR) 500 MG 24 hr tablet TAKE 1 TABLET EVERY DAY WITH BREAKFAST (Patient not taking: No sig reported) 90 tablet 1   No current facility-administered medications for this visit.   Facility-Administered Medications Ordered in Other Visits  Medication Dose Route Frequency Provider Last Rate Last Admin   0.9 %  sodium chloride infusion   Intravenous Continuous Lequita Asal, MD   Stopped at 01/20/20 1232   0.9 %  sodium chloride infusion   Intravenous Continuous Nolon Stalls C, MD 10 mL/hr at 10/19/20 0900 New Bag at 10/19/20 0900   heparin lock flush 100 unit/mL  500 Units Intravenous Once Faythe Casa E, NP       heparin lock flush 100 unit/mL  500 Units Intravenous Once  Lequita Asal, MD  sodium chloride flush (NS) 0.9 % injection 10 mL  10 mL Intravenous PRN Nolon Stalls C, MD   10 mL at 02/07/20 0815   sodium chloride flush (NS) 0.9 % injection 10 mL  10 mL Intracatheter PRN Cammie Sickle, MD   10 mL at 02/10/20 0815   sodium chloride flush (NS) 0.9 % injection 10 mL  10 mL Intravenous PRN Jacquelin Hawking, NP   10 mL at 03/01/20 1143    Review of Systems  Constitutional: Positive for malaise/fatigue. Negative for chills, fever and weight loss.  HENT: Negative for congestion, ear pain and tinnitus.   Eyes: Negative.  Negative for blurred vision and double vision.  Respiratory: Positive for shortness of breath. Negative for cough and sputum production.   Cardiovascular: Negative.  Negative for chest pain, palpitations and leg swelling.  Gastrointestinal: Negative.  Negative for abdominal pain, constipation, diarrhea, nausea and vomiting.  Genitourinary: Negative for dysuria, frequency and urgency.  Musculoskeletal: Negative for back pain and falls.  Skin: Negative.  Negative for rash.  Neurological: Positive for weakness. Negative for headaches.  Endo/Heme/Allergies: Negative.  Does not bruise/bleed easily.  Psychiatric/Behavioral: Negative.  Negative for depression. The patient is not nervous/anxious and does not have insomnia.     Performance status (ECOG): 1  Vitals Blood pressure 114/75, pulse 71, temperature 97.8 F (36.6 C), resp. rate 20, weight 156 lb 1.4 oz (70.8 kg), SpO2 98 %.  General: Well-developed, well-nourished, no acute distress. Eyes: Pink conjunctiva, anicteric sclera. Lungs: Clear to auscultation bilaterally.  No audible wheezing or coughing Heart: Regular rate and rhythm.  Abdomen: Soft, nontender, nondistended.  Musculoskeletal: No edema, cyanosis, or clubbing. Neuro: Alert, answering all questions appropriately. Cranial nerves grossly intact. Skin: No rashes or petechiae noted. Psych: Normal  affect.   Appointment on 11/09/2020  Component Date Value Ref Range Status   Magnesium 11/09/2020 1.1* 1.7 - 2.4 mg/dL Final   Performed at [facility], [address] [place], [place] [address]   WBC 11/09/2020 4.9  4.0 - 10.5 K/uL Final   RBC 11/09/2020 4.12  3.87 - 5.11 MIL/uL Final   Hemoglobin 11/09/2020 10.6* 12.0 - 15.0 g/dL Final   HCT 11/09/2020 33.3* 36.0 - 46.0 % Final   MCV 11/09/2020 80.8  80.0 - 100.0 fL Final   MCH 11/09/2020 25.7* 26.0 - 34.0 pg Final   MCHC 11/09/2020 31.8  30.0 - 36.0 g/dL Final   RDW 11/09/2020 16.7* 11.5 - 15.5 % Final   Platelets 11/09/2020 129* 150 - 400 K/uL Final   nRBC 11/09/2020 0.0  0.0 - 0.2 % Final   Neutrophils Relative % 11/09/2020 71  % Final   Neutro Abs 11/09/2020 3.4  1.7 - 7.7 K/uL Final   Lymphocytes Relative 11/09/2020 19  % Final   Lymphs Abs 11/09/2020 0.9  0.7 - 4.0 K/uL Final   Monocytes Relative 11/09/2020 9  % Final   Monocytes Absolute 11/09/2020 0.5  0.1 - 1.0 K/uL Final   Eosinophils Relative 11/09/2020 1  % Final   Eosinophils Absolute 11/09/2020 0.1  0.0 - 0.5 K/uL Final   Basophils Relative 11/09/2020 0  % Final   Basophils Absolute 11/09/2020 0.0  0.0 - 0.1 K/uL Final   Immature Granulocytes 11/09/2020 0  % Final   Abs Immature Granulocytes 11/09/2020 0.02  0.00 - 0.07 K/uL Final   Performed at [facility], [address], [place], [place] [address]   Sodium 11/09/2020 136  135 - 145  mmol/L Final   Potassium 11/09/2020 4.1  3.5 - 5.1 mmol/L Final   Chloride 11/09/2020 105  98 - 111 mmol/L Final   CO2 11/09/2020 22  22 - 32 mmol/L Final   Glucose, Bld 11/09/2020 139* 70 - 99 mg/dL Final   Glucose reference range applies only to samples taken after fasting for at least 8 hours.   BUN 11/09/2020 17  8 - 23 mg/dL Final   Creatinine, Ser 11/09/2020 1.07* 0.44 - 1.00 mg/dL Final   Calcium 11/09/2020 8.8* 8.9 - 10.3 mg/dL Final   Total Protein 11/09/2020 6.7  6.5 - 8.1 g/dL Final    Albumin 11/09/2020 4.2  3.5 - 5.0 g/dL Final   AST 11/09/2020 34  15 - 41 U/L Final   ALT 11/09/2020 29  0 - 44 U/L Final   Alkaline Phosphatase 11/09/2020 96  38 - 126 U/L Final   Total Bilirubin 11/09/2020 0.6  0.3 - 1.2 mg/dL Final   GFR, Estimated 11/09/2020 58* >60 mL/min Final   Comment: (NOTE) Calculated using the CKD-EPI Creatinine Equation (2021)    Anion gap 11/09/2020 9  5 - 15 Final   Performed at [facility], [address] [place], [place] [address]    Assessment:   1.   Stage III multiple myeloma  She is status post stem cell transplant on 05/10/2020  Symptomatically she is doing well.  Neuropathy is stable.  Exam is unremarkable.  She is on Valtrex for infection prophylaxis             Labs reviewed and acceptable for treatment  Proceed with cycle 6 Velcade today. 2.   Microcytic anemia  Labs from 10/26/2020 show hemoglobin of 9.5, hematocrit 29.6 and MCV 79.8.  Ferritin 107 and iron saturation 9%  She received IV Venofer on 10/19/2020.  Proceed with IV iron today  Plan for total of 3 doses of IV Venofer.  Next dose due on 11/02/2020. 3.   Grade II peripheral neuropathy  Neuropathy is slightly worse s/p 2nd transplant.  Neuropathy stable.  Continue to monitor. 4.   Chronic hypomagnesemia             Magnesium 6 gm IV today.  Continue magnesium checks twice weekly (Mondays and Thursdays). 5.   Chronic nausea  Continue Phenergan IV when she is in clinic.  Patient has Phenergan p.o. to use at home. 6.   B12 deficiency  B12 monthly. Plan to recheck on 11/23/20.  7.   Diarrhea  Etiology likely secondary to Velcade (incidence 19-52%).  Stool negative for infection.    Mostly resolve.d COntinue lotmotil as needed. Refilled today. 8. Cough  Chest xray negative for acute process.   Refill cough syrup  Disposition: Magnesium 6 g IV today B12 1000 mg today Proceed with cycle 6 of velcade today 2 weeks - labs (cbc, cmp, mg, ferritin, iron studies,  multiple myeloma panel, kl light chains), MD, cycle 7 velcade, magnesium   I discussed the assessment and treatment plan with the patient.  The patient was provided an opportunity to ask questions and all were answered.  The patient agreed with the plan and demonstrated an understanding of the instructions.  The patient was advised to call back if the symptoms worsen or if the condition fails to improve as anticipated.   Greater than 50% was spent in counseling and coordination of care with this patient including but not limited to discussion of the relevant topics above (See A&P) including, but  not limited to diagnosis and management of acute and chronic medical conditions.   Beckey Rutter, DNP, AGNP-C [place] at [facility] [phone] (clinic)

## 2020-11-10 LAB — KAPPA/LAMBDA LIGHT CHAINS
Kappa free light chain: 5.9 mg/L (ref 3.3–19.4)
Kappa, lambda light chain ratio: 0.82 (ref 0.26–1.65)
Lambda free light chains: 7.2 mg/L (ref 5.7–26.3)

## 2020-11-14 DIAGNOSIS — G629 Polyneuropathy, unspecified: Secondary | ICD-10-CM | POA: Diagnosis not present

## 2020-11-14 DIAGNOSIS — I350 Nonrheumatic aortic (valve) stenosis: Secondary | ICD-10-CM | POA: Diagnosis not present

## 2020-11-14 DIAGNOSIS — Z9484 Stem cells transplant status: Secondary | ICD-10-CM | POA: Diagnosis not present

## 2020-11-14 DIAGNOSIS — Z09 Encounter for follow-up examination after completed treatment for conditions other than malignant neoplasm: Secondary | ICD-10-CM | POA: Diagnosis not present

## 2020-11-14 DIAGNOSIS — R7881 Bacteremia: Secondary | ICD-10-CM | POA: Diagnosis not present

## 2020-11-14 DIAGNOSIS — D849 Immunodeficiency, unspecified: Secondary | ICD-10-CM | POA: Diagnosis not present

## 2020-11-14 DIAGNOSIS — Z9481 Bone marrow transplant status: Secondary | ICD-10-CM | POA: Diagnosis not present

## 2020-11-14 DIAGNOSIS — M79676 Pain in unspecified toe(s): Secondary | ICD-10-CM | POA: Diagnosis not present

## 2020-11-14 DIAGNOSIS — C9000 Multiple myeloma not having achieved remission: Secondary | ICD-10-CM | POA: Diagnosis not present

## 2020-11-15 ENCOUNTER — Encounter: Payer: Self-pay | Admitting: Nurse Practitioner

## 2020-11-16 ENCOUNTER — Ambulatory Visit: Payer: Medicare HMO | Admitting: Gastroenterology

## 2020-11-16 ENCOUNTER — Other Ambulatory Visit: Payer: Self-pay

## 2020-11-16 ENCOUNTER — Other Ambulatory Visit: Payer: Medicare HMO

## 2020-11-16 ENCOUNTER — Inpatient Hospital Stay: Payer: Medicare HMO

## 2020-11-16 ENCOUNTER — Encounter: Payer: Self-pay | Admitting: Gastroenterology

## 2020-11-16 ENCOUNTER — Ambulatory Visit: Payer: Medicare HMO

## 2020-11-16 ENCOUNTER — Inpatient Hospital Stay: Payer: Medicare HMO | Attending: Hematology and Oncology

## 2020-11-16 VITALS — BP 109/70 | HR 71 | Ht 62.5 in | Wt 156.6 lb

## 2020-11-16 VITALS — BP 116/69 | HR 72 | Temp 98.9°F | Resp 18

## 2020-11-16 DIAGNOSIS — R1013 Epigastric pain: Secondary | ICD-10-CM | POA: Diagnosis not present

## 2020-11-16 DIAGNOSIS — D509 Iron deficiency anemia, unspecified: Secondary | ICD-10-CM | POA: Diagnosis not present

## 2020-11-16 DIAGNOSIS — R197 Diarrhea, unspecified: Secondary | ICD-10-CM

## 2020-11-16 DIAGNOSIS — E1122 Type 2 diabetes mellitus with diabetic chronic kidney disease: Secondary | ICD-10-CM | POA: Insufficient documentation

## 2020-11-16 DIAGNOSIS — I129 Hypertensive chronic kidney disease with stage 1 through stage 4 chronic kidney disease, or unspecified chronic kidney disease: Secondary | ICD-10-CM | POA: Insufficient documentation

## 2020-11-16 DIAGNOSIS — C9000 Multiple myeloma not having achieved remission: Secondary | ICD-10-CM

## 2020-11-16 DIAGNOSIS — E538 Deficiency of other specified B group vitamins: Secondary | ICD-10-CM | POA: Diagnosis not present

## 2020-11-16 DIAGNOSIS — N183 Chronic kidney disease, stage 3 unspecified: Secondary | ICD-10-CM | POA: Diagnosis not present

## 2020-11-16 DIAGNOSIS — Z95828 Presence of other vascular implants and grafts: Secondary | ICD-10-CM

## 2020-11-16 DIAGNOSIS — Z79899 Other long term (current) drug therapy: Secondary | ICD-10-CM | POA: Diagnosis not present

## 2020-11-16 DIAGNOSIS — G8929 Other chronic pain: Secondary | ICD-10-CM

## 2020-11-16 DIAGNOSIS — M879 Osteonecrosis, unspecified: Secondary | ICD-10-CM | POA: Insufficient documentation

## 2020-11-16 LAB — COMPREHENSIVE METABOLIC PANEL
ALT: 35 U/L (ref 0–44)
AST: 38 U/L (ref 15–41)
Albumin: 4.3 g/dL (ref 3.5–5.0)
Alkaline Phosphatase: 88 U/L (ref 38–126)
Anion gap: 13 (ref 5–15)
BUN: 23 mg/dL (ref 8–23)
CO2: 21 mmol/L — ABNORMAL LOW (ref 22–32)
Calcium: 8.7 mg/dL — ABNORMAL LOW (ref 8.9–10.3)
Chloride: 104 mmol/L (ref 98–111)
Creatinine, Ser: 1.14 mg/dL — ABNORMAL HIGH (ref 0.44–1.00)
GFR, Estimated: 54 mL/min — ABNORMAL LOW (ref >60)
Glucose, Bld: 114 mg/dL — ABNORMAL HIGH (ref 70–99)
Potassium: 3.9 mmol/L (ref 3.5–5.1)
Sodium: 138 mmol/L (ref 135–145)
Total Bilirubin: 0.5 mg/dL (ref 0.3–1.2)
Total Protein: 6.8 g/dL (ref 6.5–8.1)

## 2020-11-16 LAB — CBC WITH DIFFERENTIAL/PLATELET
Abs Immature Granulocytes: 0.02 10*3/uL (ref 0.00–0.07)
Basophils Absolute: 0 10*3/uL (ref 0.0–0.1)
Basophils Relative: 0 %
Eosinophils Absolute: 0.2 10*3/uL (ref 0.0–0.5)
Eosinophils Relative: 3 %
HCT: 34.5 % — ABNORMAL LOW (ref 36.0–46.0)
Hemoglobin: 11.2 g/dL — ABNORMAL LOW (ref 12.0–15.0)
Immature Granulocytes: 0 %
Lymphocytes Relative: 18 %
Lymphs Abs: 1.1 10*3/uL (ref 0.7–4.0)
MCH: 26.7 pg (ref 26.0–34.0)
MCHC: 32.5 g/dL (ref 30.0–36.0)
MCV: 82.1 fL (ref 80.0–100.0)
Monocytes Absolute: 0.5 10*3/uL (ref 0.1–1.0)
Monocytes Relative: 9 %
Neutro Abs: 4.3 10*3/uL (ref 1.7–7.7)
Neutrophils Relative %: 70 %
Platelets: 120 10*3/uL — ABNORMAL LOW (ref 150–400)
RBC: 4.2 MIL/uL (ref 3.87–5.11)
RDW: 16.9 % — ABNORMAL HIGH (ref 11.5–15.5)
WBC: 6.1 10*3/uL (ref 4.0–10.5)
nRBC: 0 % (ref 0.0–0.2)

## 2020-11-16 LAB — MAGNESIUM: Magnesium: 1.1 mg/dL — ABNORMAL LOW (ref 1.7–2.4)

## 2020-11-16 LAB — IRON AND TIBC
Iron: 50 ug/dL (ref 28–170)
Saturation Ratios: 15 % (ref 10.4–31.8)
TIBC: 342 ug/dL (ref 250–450)
UIBC: 292 ug/dL

## 2020-11-16 LAB — VITAMIN B12: Vitamin B-12: 897 pg/mL (ref 180–914)

## 2020-11-16 MED ORDER — SODIUM CHLORIDE 0.9 % IV SOLN
Freq: Once | INTRAVENOUS | Status: AC
  Filled 2020-11-16: qty 250

## 2020-11-16 MED ORDER — MAGNESIUM SULFATE 50 % IJ SOLN
6.0000 g | Freq: Once | INTRAMUSCULAR | Status: AC
  Administered 2020-11-16: 6 g via INTRAVENOUS
  Filled 2020-11-16: qty 10

## 2020-11-16 MED ORDER — HEPARIN SOD (PORK) LOCK FLUSH 100 UNIT/ML IV SOLN
500.0000 [IU] | Freq: Once | INTRAVENOUS | Status: AC | PRN
  Administered 2020-11-16: 500 [IU]
  Filled 2020-11-16: qty 5

## 2020-11-16 MED ORDER — SODIUM CHLORIDE 0.9% FLUSH
10.0000 mL | Freq: Once | INTRAVENOUS | Status: AC
Start: 2020-11-16 — End: 2020-11-16
  Administered 2020-11-16: 10 mL via INTRAVENOUS
  Filled 2020-11-16: qty 10

## 2020-11-16 MED ORDER — HEPARIN SOD (PORK) LOCK FLUSH 100 UNIT/ML IV SOLN
INTRAVENOUS | Status: AC
  Filled 2020-11-16: qty 5

## 2020-11-16 NOTE — Patient Instructions (Signed)
[facility]    Discharge Instructions: Thank you for choosing [facility] to provide your oncology and hematology care.  If you have a lab appointment with the [facility], please go directly to the [facility] and check in at the registration area.  Wear comfortable clothing and clothing appropriate for easy access to any Portacath or PICC line.   We strive to give you quality time with your provider. You may need to reschedule your appointment if you arrive late (15 or more minutes).  Arriving late affects you and other patients whose appointments are after yours.  Also, if you miss three or more appointments without notifying the office, you may be dismissed from the clinic at the provider's discretion.      For prescription refill requests, have your pharmacy contact our office and allow 72 hours for refills to be completed.    BELOW ARE SYMPTOMS THAT SHOULD BE REPORTED IMMEDIATELY: . *FEVER GREATER THAN 100.4 F (38 C) OR HIGHER . *CHILLS OR SWEATING . *NAUSEA AND VOMITING THAT IS NOT CONTROLLED WITH YOUR NAUSEA MEDICATION . *UNUSUAL SHORTNESS OF BREATH . *UNUSUAL BRUISING OR BLEEDING . *URINARY PROBLEMS (pain or burning when urinating, or frequent urination) . *BOWEL PROBLEMS (unusual diarrhea, constipation, pain near the anus) . TENDERNESS IN MOUTH AND THROAT WITH OR WITHOUT PRESENCE OF ULCERS (sore throat, sores in mouth, or a toothache) . UNUSUAL RASH, SWELLING OR PAIN  . UNUSUAL VAGINAL DISCHARGE OR ITCHING   Items with * indicate a potential emergency and should be followed up as soon as possible or go to the Emergency Department if any problems should occur.   Should you have questions after your visit or need to cancel or reschedule your appointment, please contact [facility]  [phone] and follow the prompts.  Office hours are 8:00 a.m. to 4:30 p.m. Monday - Friday. Please note  that voicemails left after 4:00 p.m. may not be returned until the following business day.  We are closed weekends and major holidays. You have access to a nurse at all times for urgent questions. Please call the main number to the clinic [phone] and follow the prompts.  For any non-urgent questions, you may also contact your provider using MyChart. We now offer e-Visits for anyone 64 and older to request care online for non-urgent symptoms. For details visit [email or web address].   Also download the MyChart app! Go to the app store, search "MyChart", open the app, select [facility], and log in with your MyChart username and password.  Due to Covid, a mask is required upon entering the hospital/clinic. If you do not have a mask, one will be given to you upon arrival. For doctor visits, patients may have 1 support person aged 27 or older with them. For treatment visits, patients cannot have anyone with them due to current Covid guidelines and our immunocompromised population.   Magnesium Sulfate injection What is this medicine? MAGNESIUM SULFATE (mag NEE zee um SUL fate) is an electrolyte injection commonly used to treat low magnesium levels in your blood. It is also used to prevent or control seizures in women with preeclampsia or eclampsia. This medicine may be used for other purposes; ask your health care provider or pharmacist if you have questions. What should I tell my health care provider before I take this medicine? They need to know if you have any of these conditions:  heart disease  history of irregular heart beat  kidney disease  an unusual or allergic reaction to magnesium sulfate, medicines, foods, dyes, or preservatives  pregnant or trying to get pregnant  breast-feeding How should I use this medicine? This medicine is for infusion into a vein. It is given by a health care professional in a hospital or clinic setting. Talk to your pediatrician regarding the use of  this medicine in children. While this drug may be prescribed for selected conditions, precautions do apply. Overdosage: If you think you have taken too much of this medicine contact a poison control center or emergency room at once. NOTE: This medicine is only for you. Do not share this medicine with others. What if I miss a dose? This does not apply. What may interact with this medicine? This medicine may interact with the following medications:  certain medicines for anxiety or sleep  certain medicines for seizures like phenobarbital  digoxin  medicines that relax muscles for surgery  narcotic medicines for pain This list may not describe all possible interactions. Give your health care provider a list of all the medicines, herbs, non-prescription drugs, or dietary supplements you use. Also tell them if you smoke, drink alcohol, or use illegal drugs. Some items may interact with your medicine. What should I watch for while using this medicine? Your condition will be monitored carefully while you are receiving this medicine. You may need blood work done while you are receiving this medicine. What side effects may I notice from receiving this medicine? Side effects that you should report to your doctor or health care professional as soon as possible:  allergic reactions like skin rash, itching or hives, swelling of the face, lips, or tongue  facial flushing  muscle weakness  signs and symptoms of low blood pressure like dizziness; feeling faint or lightheaded, falls; unusually weak or tired  signs and symptoms of a dangerous change in heartbeat or heart rhythm like chest pain; dizziness; fast or irregular heartbeat; palpitations; breathing problems  sweating This list may not describe all possible side effects. Call your doctor for medical advice about side effects. You may report side effects to FDA at 1-800-FDA-1088. Where should I keep my medicine? This drug is given in a  hospital or clinic and will not be stored at home. NOTE: This sheet is a summary. It may not cover all possible information. If you have questions about this medicine, talk to your doctor, pharmacist, or health care provider.  2021 Elsevier/Gold Standard (2016-01-03 12:31:42)

## 2020-11-16 NOTE — Progress Notes (Signed)
[name] MD, MRCP(U.K) [address]  [place]  [place], [place] [address]  Main: [phone]  Fax: [phone]   Primary Care Physician: Glean Hess, MD  Primary Gastroenterologist:  Dr. [name]   Here to see me for Diarrhea   HPI: Sarah [name] is a 65 y.o. female Summary of history :  She was last seen in my office on 05/19/2019. I have seen her back in 2018 for history of diarrhea alternating with constipation.She has a history of multiple myeloma,constipation, has been using fentanyl patch for chronic pain. Bowel movements are preceded by abdominal distention and bloating. Has tried MiraLAX, fiber supplements docusate, senna which has not worked. Denies use of any NSAIDs she is on metformin. Complains of chronic nausea that began shortly after she eats.She hasa sensation of food sitting in her stomach for a long period of time. We did initially try to put her on a high-fiber diet but it caused her to get significant diarrhea and it was stopped.  05/07/2017: EGD: Biopsies of stomach showed antral type mucosa with features of a healing mucosal injury. I also performed a colonoscopy on the same day which was normal.  Interval history 05/26/2019-11/16/2020   Has had recurrent myeloma.  Has received chemotherapy.  She has severe aortic stenosis based on echo 05/20/2020.  Cardiology is following for possible TAVR.    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  She is presently undergoing chemotherapy.  For the past few months she has been having severe diarrhea 4-5 times a day.  She wears a diaper to prevent her from having accidents.  No blood in the stool.  She has some epigastric discomfort present throughout the day past 1 year.  Is taking omeprazole 40 mg once a day.  No clear aggravating or relieving factors.  Denies any NSAID  use.  She consumes 1 or 2 cans of Pepsi a day of coffee mate.  Current Outpatient Medications  Medication Sig Dispense Refill  . acyclovir (ZOVIRAX) 200 MG capsule Take by mouth.    Marland Kitchen allopurinol (ZYLOPRIM) 100 MG tablet TAKE 1 TABLET(100 MG) BY MOUTH DAILY as needed 90 tablet 1  . amoxicillin-clavulanate (AUGMENTIN) 875-125 MG tablet Take 1 tablet by mouth 2 (two) times daily. 14 tablet 0  . bisoprolol (ZEBETA) 5 MG tablet Take 5 mg by mouth daily.    . diphenhydrAMINE (BENADRYL) 50 MG capsule Take 50 mg by mouth as needed.    . diphenoxylate-atropine (LOMOTIL) 2.5-0.025 MG tablet Take 2 tablets by mouth 4 (four) times daily as needed for diarrhea or loose stools. 90 tablet 0  . fexofenadine (ALLEGRA) 180 MG tablet TAKE 1 TABLET(180 MG) BY MOUTH DAILY 30 tablet 5  . FLUoxetine (PROZAC) 40 MG capsule TAKE 1 CAPSULE EVERY DAY 90 capsule 1  . fluticasone (FLONASE) 50 MCG/ACT nasal spray Place 2 sprays into both nostrils daily. 16 g 2  . glucose blood test strip     . lidocaine (LIDODERM) 5 % 1 patch daily.    Marland Kitchen loperamide (IMODIUM) 2 MG capsule Take 1 capsule (2 mg total) by mouth 4 (four) times daily as needed for diarrhea or loose stools. 120 capsule 3  . methocarbamol (ROBAXIN) 500 MG tablet TAKE 1 TABLET FOUR TIMES DAILY. 120 tablet 0  . MODERNA COVID-19 VACCINE 100 MCG/0.5ML injection     . Multiple Vitamin (MULTIVITAMIN) tablet Take  1 tablet by mouth daily.    . Multiple Vitamins-Minerals (HAIR/SKIN/NAILS) TABS Take 1 tablet by mouth daily.    Marland Kitchen omeprazole (PRILOSEC) 40 MG capsule TAKE 1 CAPSULE EVERY DAY 90 capsule 0  . promethazine (PHENERGAN) 25 MG tablet Take 1 tablet (25 mg total) by mouth every 6 (six) hours as needed for nausea or vomiting. 90 tablet 0  . promethazine-dextromethorphan (PROMETHAZINE-DM) 6.25-15 MG/5ML syrup Take 5 mLs by mouth 4 (four) times daily as needed for cough. 118 mL 0  . SHINGRIX injection     . traZODone (DESYREL) 100 MG tablet Take 1 tablet (100 mg  total) by mouth at bedtime. 90 tablet 1  . acetaminophen (TYLENOL) 500 MG tablet Take 500 mg by mouth every 6 (six) hours as needed.  (Patient not taking: No sig reported)    . diclofenac sodium (VOLTAREN) 1 % GEL Apply 2 g topically 4 (four) times daily. 100 g 1  . lactulose (CHRONULAC) 10 GM/15ML solution Take 20 g by mouth daily as needed.  (Patient not taking: No sig reported)    . metFORMIN (GLUCOPHAGE-XR) 500 MG 24 hr tablet TAKE 1 TABLET EVERY DAY WITH BREAKFAST (Patient not taking: No sig reported) 90 tablet 1   No current facility-administered medications for this visit.   Facility-Administered Medications Ordered in Other Visits  Medication Dose Route Frequency Provider Last Rate Last Admin  . 0.9 %  sodium chloride infusion   Intravenous Continuous Lequita Asal, MD   Stopped at 01/20/20 1232  . 0.9 %  sodium chloride infusion   Intravenous Continuous Nolon Stalls C, MD 10 mL/hr at 10/19/20 0900 New Bag at 10/19/20 0900  . heparin lock flush 100 unit/mL  500 Units Intravenous Once Faythe Casa E, NP      . heparin lock flush 100 unit/mL  500 Units Intravenous Once Corcoran, Melissa C, MD      . sodium chloride flush (NS) 0.9 % injection 10 mL  10 mL Intravenous PRN Lequita Asal, MD   10 mL at 02/07/20 0815  . sodium chloride flush (NS) 0.9 % injection 10 mL  10 mL Intracatheter PRN Cammie Sickle, MD   10 mL at 02/10/20 0815  . sodium chloride flush (NS) 0.9 % injection 10 mL  10 mL Intravenous PRN Jacquelin Hawking, NP   10 mL at 03/01/20 1143    Allergies as of 11/16/2020 - Review Complete 11/16/2020  Allergen Reaction Noted  . Oxycodone-acetaminophen Anaphylaxis 09/01/2011  . Celebrex [celecoxib] Diarrhea 01/04/2019  . Codeine  09/05/2017  . Plerixafor  12/31/2019  . Benadryl [diphenhydramine] Palpitations 04/02/2016  . Morphine Itching and Rash 04/02/2016  . Ondansetron Diarrhea 09/01/2011  . Tylenol [acetaminophen] Itching and Rash 04/02/2016     ROS:  General: Negative for anorexia, weight loss, fever, chills, fatigue, weakness. ENT: Negative for hoarseness, difficulty swallowing , nasal congestion. CV: Negative for chest pain, angina, palpitations, dyspnea on exertion, peripheral edema.  Respiratory: Negative for dyspnea at rest, dyspnea on exertion, cough, sputum, wheezing.  GI: See history of present illness. GU:  Negative for dysuria, hematuria, urinary incontinence, urinary frequency, nocturnal urination.  Endo: Negative for unusual weight change.    Physical Examination:   BP 109/70 (BP Location: Left Arm, Patient Position: Sitting, Cuff Size: Normal)   Pulse 71   Ht 5' 2.5" (1.588 m)   Wt 156 lb 9.6 oz (71 kg)   BMI 28.19 kg/m   General: Well-nourished, well-developed in no acute distress.  Eyes: No  icterus. Conjunctivae pink. Neuro: Alert and oriented x 3.  Grossly intact. Skin: Warm and dry, no jaundice.   Psych: Alert and cooperative, normal mood and affect.   Imaging Studies: DG Chest 2 View  Result Date: 11/10/2020 CLINICAL DATA:  Cough for 2 weeks. EXAM: CHEST - 2 VIEW COMPARISON:  PA and lateral chest 03/16/2020. FINDINGS: Right IJ approach Port-A-Cath is in place and projects in good position. The lungs are clear. Heart size is normal. No pneumothorax or pleural fluid. No acute bony abnormality. IMPRESSION: No acute disease. Electronically Signed   By: Inge Rise M.D.   On: 11/10/2020 14:53    Assessment and Plan:   Soni Kegel is a 65 y.o. y/o female here today to see me today for diarrhea ongoing for a few months along with epigastric pain.  She has history of multiple myeloma with relapse and status post 1 marrow transplant in 03/20/2020 on chemotherapy at present.  Some mention about iron deficiency anemia but her hemoglobin recently was normal with a ferritin 107 3 weeks back.  Denies any overt blood loss and she has had endoscopy and colonoscopy in 2018 by myself with no gross  abnormality.  She has moderate to severe aortic stenosis and with this condition performing any endoscopy evaluation under anesthesia would be high risk.  I would not want to perform any endoscopy evaluation unless absolutely required but the benefits exceed the risks.  Plan 1.  Obtain stool studies to evaluate diarrhea and rule out any infectious process as she is immunocompromise. 2.  Increase omeprazole to 40 mg twice a day to treat epigastric discomfort. 3.  Stop all artificial sugars in her diet including Diet Coke, coffee mate.  If the diarrhea is osmotic she should respond within a few days. 4.  Video visit in 1 week's time to discuss the results and see how she is doing.  If no better need to discuss further evaluation with colonoscopy to rule out microscopic/lymphocytic colitis.   Dr [name]  MD,MRCP [facility]) Follow up in 1 week video visit

## 2020-11-17 LAB — KAPPA/LAMBDA LIGHT CHAINS
Kappa free light chain: 5.9 mg/L (ref 3.3–19.4)
Kappa, lambda light chain ratio: 0.84 (ref 0.26–1.65)
Lambda free light chains: 7 mg/L (ref 5.7–26.3)

## 2020-11-17 LAB — MULTIPLE MYELOMA PANEL, SERUM
Albumin SerPl Elph-Mcnc: 4.1 g/dL (ref 2.9–4.4)
Albumin/Glob SerPl: 1.9 — ABNORMAL HIGH (ref 0.7–1.7)
Alpha 1: 0.2 g/dL (ref 0.0–0.4)
Alpha2 Glob SerPl Elph-Mcnc: 1 g/dL (ref 0.4–1.0)
B-Globulin SerPl Elph-Mcnc: 0.9 g/dL (ref 0.7–1.3)
Gamma Glob SerPl Elph-Mcnc: 0.2 g/dL — ABNORMAL LOW (ref 0.4–1.8)
Globulin, Total: 2.2 g/dL (ref 2.2–3.9)
IgA: 15 mg/dL — ABNORMAL LOW (ref 87–352)
IgG (Immunoglobin G), Serum: 195 mg/dL — ABNORMAL LOW (ref 586–1602)
IgM (Immunoglobulin M), Srm: 9 mg/dL — ABNORMAL LOW (ref 26–217)
Total Protein ELP: 6.3 g/dL (ref 6.0–8.5)

## 2020-11-20 DIAGNOSIS — R197 Diarrhea, unspecified: Secondary | ICD-10-CM | POA: Diagnosis not present

## 2020-11-21 LAB — MULTIPLE MYELOMA PANEL, SERUM
Albumin SerPl Elph-Mcnc: 4 g/dL (ref 2.9–4.4)
Albumin/Glob SerPl: 1.8 — ABNORMAL HIGH (ref 0.7–1.7)
Alpha 1: 0.3 g/dL (ref 0.0–0.4)
Alpha2 Glob SerPl Elph-Mcnc: 1 g/dL (ref 0.4–1.0)
B-Globulin SerPl Elph-Mcnc: 0.9 g/dL (ref 0.7–1.3)
Gamma Glob SerPl Elph-Mcnc: 0.2 g/dL — ABNORMAL LOW (ref 0.4–1.8)
Globulin, Total: 2.3 g/dL (ref 2.2–3.9)
IgA: 16 mg/dL — ABNORMAL LOW (ref 87–352)
IgG (Immunoglobin G), Serum: 207 mg/dL — ABNORMAL LOW (ref 586–1602)
IgM (Immunoglobulin M), Srm: 8 mg/dL — ABNORMAL LOW (ref 26–217)
Total Protein ELP: 6.3 g/dL (ref 6.0–8.5)

## 2020-11-23 ENCOUNTER — Encounter: Payer: Self-pay | Admitting: Oncology

## 2020-11-23 ENCOUNTER — Telehealth: Payer: Medicare HMO | Admitting: Gastroenterology

## 2020-11-23 ENCOUNTER — Inpatient Hospital Stay: Payer: Medicare HMO

## 2020-11-23 ENCOUNTER — Other Ambulatory Visit: Payer: Self-pay

## 2020-11-23 ENCOUNTER — Ambulatory Visit: Payer: Medicare HMO

## 2020-11-23 ENCOUNTER — Inpatient Hospital Stay (HOSPITAL_BASED_OUTPATIENT_CLINIC_OR_DEPARTMENT_OTHER): Payer: Medicare HMO | Admitting: Oncology

## 2020-11-23 VITALS — BP 128/72 | HR 67 | Temp 97.6°F | Resp 14 | Wt 155.6 lb

## 2020-11-23 DIAGNOSIS — R110 Nausea: Secondary | ICD-10-CM | POA: Diagnosis not present

## 2020-11-23 DIAGNOSIS — Z5111 Encounter for antineoplastic chemotherapy: Secondary | ICD-10-CM | POA: Diagnosis not present

## 2020-11-23 DIAGNOSIS — Z9484 Stem cells transplant status: Secondary | ICD-10-CM | POA: Diagnosis not present

## 2020-11-23 DIAGNOSIS — C9000 Multiple myeloma not having achieved remission: Secondary | ICD-10-CM

## 2020-11-23 DIAGNOSIS — E538 Deficiency of other specified B group vitamins: Secondary | ICD-10-CM | POA: Diagnosis not present

## 2020-11-23 DIAGNOSIS — D649 Anemia, unspecified: Secondary | ICD-10-CM

## 2020-11-23 DIAGNOSIS — G620 Drug-induced polyneuropathy: Secondary | ICD-10-CM

## 2020-11-23 DIAGNOSIS — D709 Neutropenia, unspecified: Secondary | ICD-10-CM | POA: Diagnosis not present

## 2020-11-23 DIAGNOSIS — T451X5A Adverse effect of antineoplastic and immunosuppressive drugs, initial encounter: Secondary | ICD-10-CM

## 2020-11-23 DIAGNOSIS — I350 Nonrheumatic aortic (valve) stenosis: Secondary | ICD-10-CM | POA: Diagnosis not present

## 2020-11-23 DIAGNOSIS — D801 Nonfamilial hypogammaglobulinemia: Secondary | ICD-10-CM | POA: Diagnosis not present

## 2020-11-23 DIAGNOSIS — R197 Diarrhea, unspecified: Secondary | ICD-10-CM | POA: Diagnosis not present

## 2020-11-23 DIAGNOSIS — Z5112 Encounter for antineoplastic immunotherapy: Secondary | ICD-10-CM | POA: Diagnosis not present

## 2020-11-23 DIAGNOSIS — N179 Acute kidney failure, unspecified: Secondary | ICD-10-CM | POA: Diagnosis not present

## 2020-11-23 LAB — COMPREHENSIVE METABOLIC PANEL
ALT: 34 U/L (ref 0–44)
AST: 34 U/L (ref 15–41)
Albumin: 4.2 g/dL (ref 3.5–5.0)
Alkaline Phosphatase: 101 U/L (ref 38–126)
Anion gap: 8 (ref 5–15)
BUN: 13 mg/dL (ref 8–23)
CO2: 26 mmol/L (ref 22–32)
Calcium: 9.1 mg/dL (ref 8.9–10.3)
Chloride: 102 mmol/L (ref 98–111)
Creatinine, Ser: 1.08 mg/dL — ABNORMAL HIGH (ref 0.44–1.00)
GFR, Estimated: 57 mL/min — ABNORMAL LOW (ref >60)
Glucose, Bld: 115 mg/dL — ABNORMAL HIGH (ref 70–99)
Potassium: 4.1 mmol/L (ref 3.5–5.1)
Sodium: 136 mmol/L (ref 135–145)
Total Bilirubin: 0.4 mg/dL (ref 0.3–1.2)
Total Protein: 6.7 g/dL (ref 6.5–8.1)

## 2020-11-23 LAB — MAGNESIUM: Magnesium: 1.2 mg/dL — ABNORMAL LOW (ref 1.7–2.4)

## 2020-11-23 LAB — CBC WITH DIFFERENTIAL/PLATELET
Abs Immature Granulocytes: 0.03 10*3/uL (ref 0.00–0.07)
Basophils Absolute: 0 10*3/uL (ref 0.0–0.1)
Basophils Relative: 1 %
Eosinophils Absolute: 0.2 10*3/uL (ref 0.0–0.5)
Eosinophils Relative: 3 %
HCT: 34.1 % — ABNORMAL LOW (ref 36.0–46.0)
Hemoglobin: 11 g/dL — ABNORMAL LOW (ref 12.0–15.0)
Immature Granulocytes: 1 %
Lymphocytes Relative: 17 %
Lymphs Abs: 0.9 10*3/uL (ref 0.7–4.0)
MCH: 26.4 pg (ref 26.0–34.0)
MCHC: 32.3 g/dL (ref 30.0–36.0)
MCV: 82 fL (ref 80.0–100.0)
Monocytes Absolute: 0.5 10*3/uL (ref 0.1–1.0)
Monocytes Relative: 11 %
Neutro Abs: 3.5 10*3/uL (ref 1.7–7.7)
Neutrophils Relative %: 67 %
Platelets: 127 10*3/uL — ABNORMAL LOW (ref 150–400)
RBC: 4.16 MIL/uL (ref 3.87–5.11)
RDW: 17.1 % — ABNORMAL HIGH (ref 11.5–15.5)
WBC: 5.1 10*3/uL (ref 4.0–10.5)
nRBC: 0 % (ref 0.0–0.2)

## 2020-11-23 LAB — VITAMIN B12: Vitamin B-12: 921 pg/mL — ABNORMAL HIGH (ref 180–914)

## 2020-11-23 MED ORDER — SODIUM CHLORIDE 0.9 % IV SOLN
Freq: Once | INTRAVENOUS | Status: AC
Start: 2020-11-23 — End: 2020-11-23
  Filled 2020-11-23: qty 250

## 2020-11-23 MED ORDER — MAGNESIUM CHLORIDE 64 MG PO TBEC: 1.0000 | DELAYED_RELEASE_TABLET | Freq: Two times a day (BID) | ORAL | 1 refills | Status: DC

## 2020-11-23 MED ORDER — BORTEZOMIB CHEMO IV INJECTION 3.5 MG (1MG/ML-GENERIC)
2.0000 mg | Freq: Once | INTRAVENOUS | Status: AC
  Administered 2020-11-23: 2 mg via INTRAVENOUS
  Filled 2020-11-23: qty 2

## 2020-11-23 MED ORDER — SODIUM CHLORIDE 0.9 % IV SOLN
25.0000 mg | Freq: Once | INTRAVENOUS | Status: DC
  Filled 2020-11-23: qty 1

## 2020-11-23 MED ORDER — MAGNESIUM SULFATE 2 GM/50ML IV SOLN
2.0000 g | Freq: Once | INTRAVENOUS | Status: AC
  Administered 2020-11-23: 2 g via INTRAVENOUS
  Filled 2020-11-23: qty 50

## 2020-11-23 MED ORDER — MAGNESIUM SULFATE 4 GM/100ML IV SOLN
4.0000 g | Freq: Once | INTRAVENOUS | Status: AC
Start: 2020-11-23 — End: 2020-11-23
  Administered 2020-11-23: 4 g via INTRAVENOUS
  Filled 2020-11-23: qty 100

## 2020-11-23 MED ORDER — SODIUM CHLORIDE 0.9 % IV SOLN: 6.0000 g | Freq: Once | INTRAVENOUS | Status: DC

## 2020-11-23 MED ORDER — PROMETHAZINE HCL 25 MG PO TABS: 25.0000 mg | ORAL_TABLET | Freq: Four times a day (QID) | ORAL | 0 refills | Status: DC | PRN

## 2020-11-23 MED ORDER — SODIUM CHLORIDE 0.9 % IV SOLN
25.0000 mg | Freq: Once | INTRAVENOUS | Status: AC
  Administered 2020-11-23: 25 mg via INTRAVENOUS
  Filled 2020-11-23: qty 1

## 2020-11-23 MED ORDER — HEPARIN SOD (PORK) LOCK FLUSH 100 UNIT/ML IV SOLN
500.0000 [IU] | Freq: Once | INTRAVENOUS | Status: AC
  Administered 2020-11-23: 500 [IU] via INTRAVENOUS
  Filled 2020-11-23: qty 5

## 2020-11-23 NOTE — Progress Notes (Deleted)
Rescheduled

## 2020-11-23 NOTE — Progress Notes (Signed)
Pt states she has been losing her appetite for about 3-4 weeks due to her constant diarrhea. Dr advised no diet coke and took her off coffeemate. Diarrhea is not watery but still soft. Needs a refill on omeprazole and promethazine.

## 2020-11-23 NOTE — Patient Instructions (Signed)
[facility]  Discharge Instructions: Thank you for choosing [facility] to provide your oncology and hematology care.  If you have a lab appointment with the Cancer Center, please go directly to the Cancer Center and check in at the registration area.  Wear comfortable clothing and clothing appropriate for easy access to any Portacath or PICC line.   We strive to give you quality time with your provider. You may need to reschedule your appointment if you arrive late (15 or more minutes).  Arriving late affects you and other patients whose appointments are after yours.  Also, if you miss three or more appointments without notifying the office, you may be dismissed from the clinic at the provider's discretion.      For prescription refill requests, have your pharmacy contact our office and allow 72 hours for refills to be completed.    Today you received the following chemotherapy and/or immunotherapy agents       To help prevent nausea and vomiting after your treatment, we encourage you to take your nausea medication as directed.  BELOW ARE SYMPTOMS THAT SHOULD BE REPORTED IMMEDIATELY: *FEVER GREATER THAN 100.4 F (38 C) OR HIGHER *CHILLS OR SWEATING *NAUSEA AND VOMITING THAT IS NOT CONTROLLED WITH YOUR NAUSEA MEDICATION *UNUSUAL SHORTNESS OF BREATH *UNUSUAL BRUISING OR BLEEDING *URINARY PROBLEMS (pain or burning when urinating, or frequent urination) *BOWEL PROBLEMS (unusual diarrhea, constipation, pain near the anus) TENDERNESS IN MOUTH AND THROAT WITH OR WITHOUT PRESENCE OF ULCERS (sore throat, sores in mouth, or a toothache) UNUSUAL RASH, SWELLING OR PAIN  UNUSUAL VAGINAL DISCHARGE OR ITCHING   Items with * indicate a potential emergency and should be followed up as soon as possible or go to the Emergency Department if any problems should occur.  Please show the CHEMOTHERAPY ALERT CARD or IMMUNOTHERAPY ALERT CARD at check-in to the Emergency  Department and triage nurse.  Should you have questions after your visit or need to cancel or reschedule your appointment, please contact [facility]  [phone] and follow the prompts.  Office hours are 8:00 a.m. to 4:30 p.m. Monday - Friday. Please note that voicemails left after 4:00 p.m. may not be returned until the following business day.  We are closed weekends and major holidays. You have access to a nurse at all times for urgent questions. Please call the main number to the clinic [phone] and follow the prompts.  For any non-urgent questions, you may also contact your provider using MyChart. We now offer e-Visits for anyone 18 and older to request care online for non-urgent symptoms. For details visit [email or web address].   Also download the MyChart app! Go to the app store, search "MyChart", open the app, select , and log in with your MyChart username and password.  Due to Covid, a mask is required upon entering the hospital/clinic. If you do not have a mask, one will be given to you upon arrival. For doctor visits, patients may have 1 support person aged 18 or older with them. For treatment visits, patients cannot have anyone with them due to current Covid guidelines and our immunocompromised population.  

## 2020-11-23 NOTE — Progress Notes (Signed)
[facility]  [address] [place], [place] [address] Phone: [phone]  Fax: [phone]   Clinic Day:  11/23/2020   Referring physician: Glean Hess, MD  Chief Complaint: Sarah [name] is a 65 y.o. 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  Initial bone marrow revealed 80% plasma cells. Lambda free light chains were 1340. She had nephrotic range proteinuria. She initially underwent induction with RVD. Revlimid maintenance was discontinued on 01/21/2017 secondary to intolerance.   07/19/2019 Bone marrowaspirate andbiopsyon revealed anormocellularmarrow withbut increased lambda-restricted plasma cells (9% aspirate, 40% CD138 immunohistochemistry).Findingswereconsistent with recurrent plasma cell myeloma.Flow cytometry revealed no monoclonal B-cell or phenotypically aberrant T-cell population. Cytogeneticswere 71, XX (normal). 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  Lambda light chainshave been followed: 22.2 (ratio 0.56) on 07/03/2017, 30.8 (ratio 0.78) on 09/02/2017, 36.9 (ratio 0.40) on 10/21/2017, 37.4 (ratio 0.41) on 12/16/2017, 70.7(ratio 0.31) on 02/17/2018, 64.2 (ratio 0.27) on 04/07/2018, 78.9 (ratio 0.18) on 05/26/2018, 128.8 (ratio 0.17) on 08/06/2018, 181.5 (ratio 0.13) on 10/08/2018, 130.9 (ratio 0.13)  on 10/20/2018, 160.7 (ratio 0.10)on 12/09/2018, 236.6 (ratio 0.07) on 02/01/2019, 363.6 (ratio 0.04) on 03/22/2019, 404.8 (ratio 0.04) on 04/05/2019, 420.7 (ratio 0.03) on 05/24/2019, 573.4 (ratio 0.03) on 06/23/2019, 451.05(ratio 0.02) on02/19/2021, 47.2 (ratio 0.13) on 10/25/2019, 22.4 (ratio 0.21) on 11/25/2019, 16.5 (ratio 0.33) on 01/10/2020, 14.6 (ratio 0.34) on 02/07/2020, 13.1 (ratio 0.31) on 03/07/2020, 10.1 (ratio 0.38) on 04/10/2020, and 9.5 (ratio 0.21) on 06/19/2020.  24 hour UPEPon 06/03/2019 revealed kappa free light chains95.76,lambda free light chains1,260.71, andratio 0.08.24 hourUPEPon 02/22/2021revealed totalproteinof773m/24 hrs withlambdafree light chains 1,084.149mL andratioof0.10(1.03-31.76). M spike inurinewas46.1%(3612m4 hrs).  Bone surveyon 04/08/2016 and11/28/2018 revealed no definite lytic lesion seen in the visualized skeleton.Bone surveyon 11/19/2018 revealed no suspicious lucent lesionsand no acute bony abnormality.PET scanon 07/12/2019 revealed no focal metabolic activity to suggest active myeloma within the skeleton. There wereno lytic lesions identified on the CT portion of the examorsoft tissue plasmacytomas. There was no evidence of multiple myeloma.  Pretreatment RBC phenotypeon 09/23/2019 waspositivefor C, e, DUFFY B, KIDD B, M, S, and s antigen; negativefor c, E, KELL, DUFFY A, KIDD A, and N antigen.  09/27/2019 - 10/25/2019; 12/09/2019 - 03/13/2020 6 cycles of daratumumab and hyaluronidase-fihj, Pomalyst, and Decadron (DPd). Cycle #1 was complicated by fever and neutropenia requiring admission.  Cycle #2 was complicated by pneumonia requiring admission.  Cycle #6 was complicated with an ER evaluation for an elevated lactic acid.  [facility] 140 mg/m2.    She is s/p week #3 Velcade maintenance (began 08/31/2020 -  09/28/2020).  She has no increase in neuropathy.  She has severe aortic stenosis.  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 Cardiology is following for a possible TAVR in the future.  Echo on 07/05/2020 revealed moderate to severe aortic  stenosis with an EF of 50-55%.  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 Zometa was discontinued in 01/2017. She has chronic nauseaon Phenergan.    B12 deficiency. B12 was 254 on 04/09/2017, 295 on 08/20/2018, and 391 on 10/08/2018. Shewasonoral B12.She received B12 monthly (last03/17/2022).Folate was12.1on 01/10/2020.  She has iron deficiency. Ferritin was 32 on 07/01/2019. She received Venoferon 07/15/2019 and 07/22/2019.  She has hypogammaglobulinemia.  IgG was 245 on 11/25/2019.  She received monthly IVIG (07/22/021 - 03/22/2020).  She received IVIG 400 mg/kg on 01/20/2020, 200 mg/kg on 02/17/2020, and 300 mg/kg on 03/22/2020.  IVIG on 02/72/5366 was complicated by acute renal failure. IgG trough level was 418 on 02/17/2020.  10/15/2019 - 10/20/2019 She was admitted to [facility] fever and neutropenia. Cultures were negative. CXR was negative. She received broad spectrum antibiotics and daily Granix. She received IVF for acute renal insufficiency due to diarrhea and dehydration. Creatine was 1.66 on admission and 1.12 on discharge.   03/01/2020 - 03/05/2020 admitted to [facility] from with fever and neutropenia. CXR revealed no active cardiopulmonary disease. Chest CT with contrast revealed no acute intrathoracic pathology. There were findings which could be suggestive of prior granulomatous disease. She was treated with Cefepime and Vancomycin, then switched to ciprofloxacin on 03/03/2020.   05/08/2020 - 12/05/2021The patient was admitted to [facility] from  for autologous bone marrow transplant.   She received conditioning with melphalan 140 mg/m2.   Bone marrow transplant was on 05/10/2020. The patient developed fevers daily from 05/19/2020 - 05/24/2020. Blood cultures were + for strept sanguis bacteremia.  She was treated cefepime, vancomycin, and solumedrol for possible engraftment syndrome. Cefepime was switched to ceftriaxone; she completed 2 weeks of ceftriaxone on 06/03/2020. She had chemotherapy induced diarrhea.  She experienced urinary retention.  She received the [place] 09/20/2019 and 10/09/2019. She received the Booster on 08/10/2020. She received the tetanus shot on 08/20/2020. She received the shingles vaccine recently.   INTERVAL HISTORY Sarah [name] is a 65 y.o. female who has above history reviewed by me today presents for follow up visit for management of multiple myeloma Problems and complaints are listed below:  February 2022 patient has been on maintenance Velcade 2 mg every 2 weeks.  Overall she tolerates well except diarrhea which may be side effects from Velcade or other etiologies.  She has been seen by gastroenterology Dr. Vicente Males and was recommended to avoid artificial sugars in her diet including Diet Coke and coffee mate.  She feels some improvement of her diarrhea frequency since the dietary modification.  GI profile PCR negative, C. difficile toxin A+ B negative, fecal calprotectin 77,  She uses Lomotil 2 tablets  every 4 hours as needed.  She was recommended to increase omeprazole to 40 mg twice daily for epigastric discomfort.  Appetite has decreased  chronic neuropathy, symptoms are stable. Patient has chronic magnesium, gets 4 to 6 g of IV magnesium with each visit.  Patient reports that she has tried slow magnesium in the past which did not help.  For her diabetes, patient has discontinued metformin due to diarrhea and started on glipizide 2.5 mg  Past Medical History:  Diagnosis Date  . Abnormal stress test 02/14/2016   Overview:  Added automatically from request for surgery [id number]   . Anemia   . Anxiety   . Arthritis   . Bicuspid aortic valve   . CHF (congestive heart failure) ([place])   . CKD (chronic kidney disease) stage 3, GFR 30-59 ml/min (HCC)   .  Depression   . Diabetes mellitus ([place])   . Dizziness   . Fatty liver   . Frequent falls   . GERD (gastroesophageal reflux disease)   . Gout   . Heart murmur   . History of blood transfusion   . History of bone marrow transplant ([place])   . History of uterine fibroid   . Hx of cardiac catheterization 06/05/2016   Overview:  Normal coronaries 2017  . Hypertension   . Hypomagnesemia   . Multiple myeloma ([place])   . Personal history of chemotherapy   . Renal cyst     Past Surgical History:  Procedure Laterality Date  . ABDOMINAL HYSTERECTOMY    . 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 CARDIAC ELECTROPHYSIOLOGY MAPPING AND ABLATION    . CARPAL TUNNEL RELEASE Bilateral   . CHOLECYSTECTOMY  2008  . COLONOSCOPY WITH PROPOFOL N/A 05/07/2017   Procedure: COLONOSCOPY WITH PROPOFOL;  Surgeon: [name], MD;  Location[facility] ENDOSCOPY;  Service: Gastroenterology;  Laterality: N/A;  . ESOPHAGOGASTRODUODENOSCOPY (EGD) WITH PROPOFOL N/A 05/07/2017   Procedure: ESOPHAGOGASTRODUODENOSCOPY (EGD) WITH PROPOFOL;  Surgeon: [name], MD;  Location: [facility] ENDOSCOPY;  Service: Gastroenterology;  Laterality: N/A;  . FOOT SURGERY Bilateral   . INCONTINENCE SURGERY  2009  . INTERSTIM IMPLANT PLACEMENT    . other     over active bladder  . OTHER SURGICAL HISTORY     bladder stimulator   . PARTIAL HYSTERECTOMY  03/1996   fibroids  . PORTA CATH INSERTION N/A 03/10/2019   Procedure: PORTA CATH INSERTION;  Surgeon: Algernon Huxley, MD;  Location: [facility] CV LAB;  Service: Cardiovascular;  Laterality: N/A;  . TONSILLECTOMY  2007    Family History  Problem Relation Age of Onset  . Colon cancer Father   . Renal Disease Father   . Diabetes Mellitus II Father   . Melanoma Paternal Grandmother   . Breast cancer Maternal Aunt 39  .  Anemia Mother   . Heart disease Mother   . Heart failure Mother   . Renal Disease Mother   . Congestive Heart Failure Mother   . Heart disease Maternal Uncle   . Throat cancer Maternal Uncle   . Lung cancer Maternal Uncle   . Liver disease Maternal Uncle   . Heart failure Maternal Uncle   . Hearing loss Son 7       Suicide     Social History:  reports that she quit smoking about 29 years ago. Her smoking use included cigarettes. She has a 20.00 pack-year smoking history. She has never used smokeless tobacco. She reports current alcohol use. She reports that she does not use drugs.  She is on disability. She notes exposure to perchloroethylene [facility]).She lives much of her adult life in [place]. Her husband passed away.   Allergies:  Allergies  Allergen Reactions  . Oxycodone-Acetaminophen Anaphylaxis    Swelling and rash  . Celebrex [Celecoxib] Diarrhea  . Codeine   . Plerixafor     In 2016 during ASCT collection patient developed fever to 103.10F and required hospitalization  . Benadryl [Diphenhydramine] Palpitations  . Morphine Itching and Rash  . Ondansetron Diarrhea  . Tylenol [Acetaminophen] Itching and Rash    Current Medications: Current Outpatient Medications  Medication Sig Dispense Refill  . acyclovir (ZOVIRAX) 400 MG tablet Take 400 mg by mouth 2 (two) times daily.    Marland Kitchen allopurinol (ZYLOPRIM) 100 MG tablet TAKE 1 TABLET(100 MG) BY MOUTH DAILY as needed 90  tablet 1  . bisoprolol (ZEBETA) 5 MG tablet Take 5 mg by mouth daily.    . diphenoxylate-atropine (LOMOTIL) 2.5-0.025 MG tablet Take 2 tablets by mouth 4 (four) times daily as needed for diarrhea or loose stools. 90 tablet 0  . FLUoxetine (PROZAC) 40 MG capsule TAKE 1 CAPSULE EVERY DAY 90 capsule 1  . fluticasone (FLONASE) 50 MCG/ACT nasal spray Place 2 sprays into both nostrils daily. 16 g 2  . glucose blood test strip     . lidocaine (LIDODERM) 5 % 1 patch daily.    Marland Kitchen loperamide (IMODIUM) 2 MG  capsule Take 1 capsule (2 mg total) by mouth 4 (four) times daily as needed for diarrhea or loose stools. 120 capsule 3  . magnesium chloride (SLOW-MAG) 64 MG TBEC SR tablet Take 1 tablet (64 mg total) by mouth 2 (two) times daily. 60 tablet 1  . methocarbamol (ROBAXIN) 500 MG tablet TAKE 1 TABLET FOUR TIMES DAILY. 120 tablet 0  . MODERNA COVID-19 VACCINE 100 MCG/0.5ML injection     . Multiple Vitamin (MULTIVITAMIN) tablet Take 1 tablet by mouth daily.    . Multiple Vitamins-Minerals (HAIR/SKIN/NAILS) TABS Take 1 tablet by mouth daily.    Marland Kitchen omeprazole (PRILOSEC) 40 MG capsule TAKE 1 CAPSULE EVERY DAY 90 capsule 0  . promethazine-dextromethorphan (PROMETHAZINE-DM) 6.25-15 MG/5ML syrup Take 5 mLs by mouth 4 (four) times daily as needed for cough. 118 mL 0  . SHINGRIX injection     . traZODone (DESYREL) 100 MG tablet Take 1 tablet (100 mg total) by mouth at bedtime. 90 tablet 1  . acetaminophen (TYLENOL) 500 MG tablet Take 500 mg by mouth every 6 (six) hours as needed.  (Patient not taking: No sig reported)    . acyclovir (ZOVIRAX) 200 MG capsule Take by mouth. (Patient not taking: Reported on 11/23/2020)    . diclofenac sodium (VOLTAREN) 1 % GEL Apply 2 g topically 4 (four) times daily. (Patient not taking: Reported on 11/23/2020) 100 g 1  . diphenhydrAMINE (BENADRYL) 50 MG capsule Take 50 mg by mouth as needed. (Patient not taking: Reported on 11/23/2020)    . fexofenadine (ALLEGRA) 180 MG tablet TAKE 1 TABLET(180 MG) BY MOUTH DAILY 30 tablet 5  . lactulose (CHRONULAC) 10 GM/15ML solution Take 20 g by mouth daily as needed.  (Patient not taking: No sig reported)    . promethazine (PHENERGAN) 25 MG tablet Take 1 tablet (25 mg total) by mouth every 6 (six) hours as needed for nausea or vomiting. 90 tablet 0   No current facility-administered medications for this visit.   Facility-Administered Medications Ordered in Other Visits  Medication Dose Route Frequency Provider Last Rate Last Admin  . 0.9 %   sodium chloride infusion   Intravenous Continuous Lequita Asal, MD   Stopped at 01/20/20 1232  . 0.9 %  sodium chloride infusion   Intravenous Continuous Nolon Stalls C, MD 10 mL/hr at 10/19/20 0900 New Bag at 10/19/20 0900  . heparin lock flush 100 unit/mL  500 Units Intravenous Once Faythe Casa E, NP      . heparin lock flush 100 unit/mL  500 Units Intravenous Once Lequita Asal, MD      . promethazine (PHENERGAN) 25 mg in sodium chloride 0.9 % 50 mL IVPB  25 mg Intravenous Once Corcoran, Melissa C, MD      . sodium chloride flush (NS) 0.9 % injection 10 mL  10 mL Intravenous PRN Corcoran, Drue Second, MD   10 mL at  02/07/20 0815  . sodium chloride flush (NS) 0.9 % injection 10 mL  10 mL Intracatheter PRN Cammie Sickle, MD   10 mL at 02/10/20 0815  . sodium chloride flush (NS) 0.9 % injection 10 mL  10 mL Intravenous PRN Jacquelin Hawking, NP   10 mL at 03/01/20 1143    Review of Systems  Constitutional: Negative for chills, fever, malaise/fatigue and weight loss.  HENT: Negative for sore throat.   Eyes: Negative for redness.  Respiratory: Negative for cough, shortness of breath and wheezing.   Cardiovascular: Negative for chest pain, palpitations and leg swelling.  Gastrointestinal: Positive for diarrhea and nausea. Negative for abdominal pain, blood in stool and vomiting.  Genitourinary: Negative for dysuria.  Musculoskeletal: Positive for back pain and joint pain. Negative for myalgias.  Skin: Negative for rash.  Neurological: Positive for tingling and sensory change. Negative for dizziness and tremors.  Endo/Heme/Allergies: Does not bruise/bleed easily.  Psychiatric/Behavioral: Negative for hallucinations.    Performance status (ECOG): 1  Vitals Blood pressure 128/72, pulse 67, temperature 97.6 F (36.4 C), resp. rate 14, weight 155 lb 10.3 oz (70.6 kg), SpO2 97 %.  Physical Exam Vitals and nursing note reviewed.  Constitutional:      General: She is  not in acute distress.    Appearance: She is well-developed. She is not ill-appearing, toxic-appearing or diaphoretic.     Interventions: Face mask in place.  HENT:     Head: Normocephalic and atraumatic.     Mouth/Throat:     Mouth: Mucous membranes are moist.     Pharynx: Oropharynx is clear.  Eyes:     General: No scleral icterus.    Pupils: Pupils are equal, round, and reactive to light.  Cardiovascular:     Rate and Rhythm: Normal rate and regular rhythm.     Heart sounds: Normal heart sounds. No murmur heard.   Pulmonary:     Effort: Pulmonary effort is normal. No respiratory distress.     Breath sounds: Normal breath sounds. No wheezing or rales.  Chest:     Chest wall: No tenderness.  Breasts:     Right: No supraclavicular adenopathy.     Left: No supraclavicular adenopathy.    Abdominal:     General: Bowel sounds are normal. There is no distension.     Palpations: Abdomen is soft. There is no hepatomegaly, splenomegaly or mass.     Tenderness: There is no abdominal tenderness. There is no guarding or rebound.  Musculoskeletal:        General: Swelling (right knee, subtle fullness) present. Tenderness: BLE, medial right knee. Normal range of motion.     Cervical back: Normal range of motion and neck supple.     Right lower leg: No edema.     Left lower leg: No edema.  Lymphadenopathy:     Head:     Right side of head: No preauricular, posterior auricular or occipital adenopathy.     Left side of head: No preauricular, posterior auricular or occipital adenopathy.     Cervical: No cervical adenopathy.     Upper Body:     Right upper body: No supraclavicular adenopathy.     Left upper body: No supraclavicular adenopathy.  Skin:    General: Skin is warm and dry.     Coloration: Skin is not jaundiced.  Neurological:     Mental Status: She is alert and oriented to person, place, and time. Mental status is at baseline.  Psychiatric:        Mood and Affect: Mood  normal.    Infusion on 11/23/2020  Component Date Value Ref Range Status  . WBC 11/23/2020 5.1  4.0 - 10.5 K/uL Final  . RBC 11/23/2020 4.16  3.87 - 5.11 MIL/uL Final  . Hemoglobin 11/23/2020 11.0* 12.0 - 15.0 g/dL Final  . HCT 11/23/2020 34.1* 36.0 - 46.0 % Final  . MCV 11/23/2020 82.0  80.0 - 100.0 fL Final  . MCH 11/23/2020 26.4  26.0 - 34.0 pg Final  . MCHC 11/23/2020 32.3  30.0 - 36.0 g/dL Final  . RDW 11/23/2020 17.1* 11.5 - 15.5 % Final  . Platelets 11/23/2020 127* 150 - 400 K/uL Final  . nRBC 11/23/2020 0.0  0.0 - 0.2 % Final  . Neutrophils Relative % 11/23/2020 67  % Final  . Neutro Abs 11/23/2020 3.5  1.7 - 7.7 K/uL Final  . Lymphocytes Relative 11/23/2020 17  % Final  . Lymphs Abs 11/23/2020 0.9  0.7 - 4.0 K/uL Final  . Monocytes Relative 11/23/2020 11  % Final  . Monocytes Absolute 11/23/2020 0.5  0.1 - 1.0 K/uL Final  . Eosinophils Relative 11/23/2020 3  % Final  . Eosinophils Absolute 11/23/2020 0.2  0.0 - 0.5 K/uL Final  . Basophils Relative 11/23/2020 1  % Final  . Basophils Absolute 11/23/2020 0.0  0.0 - 0.1 K/uL Final  . Immature Granulocytes 11/23/2020 1  % Final  . Abs Immature Granulocytes 11/23/2020 0.03  0.00 - 0.07 K/uL Final   Performed at [facility], [address] [place], [place] [address]  . Sodium 11/23/2020 136  135 - 145 mmol/L Final  . Potassium 11/23/2020 4.1  3.5 - 5.1 mmol/L Final  . Chloride 11/23/2020 102  98 - 111 mmol/L Final  . CO2 11/23/2020 26  22 - 32 mmol/L Final  . Glucose, Bld 11/23/2020 115* 70 - 99 mg/dL Final   Glucose reference range applies only to samples taken after fasting for at least 8 hours.  . BUN 11/23/2020 13  8 - 23 mg/dL Final  . Creatinine, Ser 11/23/2020 1.08* 0.44 - 1.00 mg/dL Final  . Calcium 11/23/2020 9.1  8.9 - 10.3 mg/dL Final  . Total Protein 11/23/2020 6.7  6.5 - 8.1 g/dL Final  . Albumin 11/23/2020 4.2  3.5 - 5.0 g/dL Final  . AST 11/23/2020 34  15 - 41 U/L Final  . ALT 11/23/2020 34  0 -  44 U/L Final  . Alkaline Phosphatase 11/23/2020 101  38 - 126 U/L Final  . Total Bilirubin 11/23/2020 0.4  0.3 - 1.2 mg/dL Final  . GFR, Estimated 11/23/2020 57* >60 mL/min Final   Comment: (NOTE) Calculated using the CKD-EPI Creatinine Equation (2021)   . Anion gap 11/23/2020 8  5 - 15 Final   Performed at [facility], [address], [place], [place] [address]  . Magnesium 11/23/2020 1.2* 1.7 - 2.4 mg/dL Final   Performed at [facility], [address] [place], [place] [address]    Assessment/Plan 1. Multiple myeloma not having achieved remission ([place])   2. Encounter for antineoplastic chemotherapy   3. Chronic nausea   4. B12 deficiency   5. Chemotherapy-induced neuropathy ([place])   6. Diarrhea, unspecified type   7. Hypomagnesemia   8. 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[place])     1Recurrent lambda light chain multiple myeloma s/p second autologous bone marrow transplant [05/10/2020] Clinically patient is doing well.  She has been on Velcade maintenance every 2 weeks.  Neuropathy symptoms are stable Labs are reviewed and discussed with patient Proceed with cycle 7 Velcade 2 mg today. 11/16/2020, kappa/light chain ratio is normal at 0.84. Continue prophylaxis with Valtrex up to 1 year after transplant-November 2022 Vaccination at [place] transplant RN coordinator [phone]).  2 iron deficiency anemia, Hemoglobin is stable at 11, iron panel showed increased iron saturation to 15.  No ferritin was checked today.  3grade 2 chemotherapy-induced neuropathy, Slightly worse after second transplant.  Symptoms are stable. Continue to monitor-patient's preference  4 chronic diarrhea, Gastroenterology recommendation was reviewed.  Avoid artificial sugar. Her symptom seems to have improved slightly after dietary modification. Velcade side effects may also contribute to diarrhea. GI may consider colonoscopy if symptoms persist to rule out  microscopic colitis   5.   Chronic hypomagnesemia Magnesium is 1.2 today, proceed with 6 g of IV magnesium today. Discussed with patient about retrying Slow-Mag 64 mg twice daily. Repeat magnesium level with possible magnesium in 1 week.  6.   Chronic nausea, patient request to have IV Phenergan in the clinic with treatment.  She has antiemetics at home.  7.   B12 deficiency, patient has been on vitamin B12 monthly.  We will schedule We will repeat vitamin B12 level in the future.  RTC in 1 week with lab- [place] magnesium/IV magnesium  RTC in 2 weeks for labs (CBC with diff, CMP, Mg), covering provider-[place] for cycle 8 Velcade and IV Mg. RTC in 4 weeks for MD-[place] labs [facility] with diff, CMP, Mg, SPEP, light chain ratio ] and cycle 9 Velcade.  I discussed the assessment and treatment plan with the patient.  The patient was provided an opportunity to ask questions and all were answered.  The patient agreed with the plan and demonstrated an understanding of the instructions.  The patient was advised to call back if the symptoms worsen or if the condition fails to improve as anticipated.   We spent sufficient time to discuss many aspect of care, questions were answered to patient's satisfaction. A total of 40 minutes was spent on this visit.  With 15 minutes spent reviewing previous medical records, 20 minutes counseling the patient on the diagnosis,  chemotherapy treatments,  management of symptoms.  Additional 5 minutes was spent on answering patient's questions.   Earlie Server, MD, PhD Hematology Oncology [place] at [facility] 11/23/2020

## 2020-11-24 ENCOUNTER — Telehealth: Payer: Self-pay

## 2020-11-24 LAB — GI PROFILE, STOOL, PCR

## 2020-11-24 LAB — KAPPA/LAMBDA LIGHT CHAINS
Kappa free light chain: 8.2 mg/L (ref 3.3–19.4)
Kappa, lambda light chain ratio: 0.94 (ref 0.26–1.65)
Lambda free light chains: 8.7 mg/L (ref 5.7–26.3)

## 2020-11-24 LAB — CALPROTECTIN, FECAL: Calprotectin, Fecal: 77 ug/g (ref 0–120)

## 2020-11-24 LAB — C DIFFICILE, CYTOTOXIN B

## 2020-11-24 LAB — C DIFFICILE TOXINS A+B W/RFLX: C difficile Toxins A+B, EIA: NEGATIVE

## 2020-11-24 NOTE — Telephone Encounter (Signed)
-----   Message from Earlie Server, MD sent at 11/23/2020  5:29 PM EDT ----- Please add glipizide 2.5 mg daily to her medication list.  She self stopped metformin and is started on glipizide using her own supplies.

## 2020-11-24 NOTE — Telephone Encounter (Signed)
Patient has been informed of results. Informed her of Dr. Georgeann Oppenheim comments/recommedations. Patient states she is doing better and started a new medication yesterday. She plans to see how that works. If things change she will contact us. Pt verbalized understanding.

## 2020-11-24 NOTE — Telephone Encounter (Signed)
-----   Message from [name], MD sent at 11/24/2020  8:20 AM EDT ----- Sorry DR Thurnell Lose in my office was to receive the message my error , She will follow up with the patient   Thanks   Kiran     ----- Message ----- From: Glean Hess, MD Sent: 11/24/2020   7:55 AM EDT To: [name], MD  You may have sent this to me inadvertently.  Or should I have my CMA call the patient?  ----- Message ----- From: [name], MD Sent: 11/24/2020   7:43 AM EDT To: Glean Hess, MD  Stool tests are negative- if still having diarrhea will need colonoscopy and biopsies , please inform and schedule if having diarrhea

## 2020-11-24 NOTE — Telephone Encounter (Signed)
Medication has been added to patient's chart.

## 2020-11-25 ENCOUNTER — Other Ambulatory Visit: Payer: Self-pay | Admitting: Internal Medicine

## 2020-11-25 DIAGNOSIS — K219 Gastro-esophageal reflux disease without esophagitis: Secondary | ICD-10-CM

## 2020-11-28 ENCOUNTER — Ambulatory Visit: Payer: Medicare PPO

## 2020-11-28 LAB — MULTIPLE MYELOMA PANEL, SERUM
Albumin SerPl Elph-Mcnc: 4.1 g/dL (ref 2.9–4.4)
Albumin/Glob SerPl: 2.1 — ABNORMAL HIGH (ref 0.7–1.7)
Alpha 1: 0.2 g/dL (ref 0.0–0.4)
Alpha2 Glob SerPl Elph-Mcnc: 0.9 g/dL (ref 0.4–1.0)
B-Globulin SerPl Elph-Mcnc: 0.8 g/dL (ref 0.7–1.3)
Gamma Glob SerPl Elph-Mcnc: 0.1 g/dL — ABNORMAL LOW (ref 0.4–1.8)
Globulin, Total: 2 g/dL — ABNORMAL LOW (ref 2.2–3.9)
IgA: 18 mg/dL — ABNORMAL LOW (ref 87–352)
IgG (Immunoglobin G), Serum: 222 mg/dL — ABNORMAL LOW (ref 586–1602)
IgM (Immunoglobulin M), Srm: 14 mg/dL — ABNORMAL LOW (ref 26–217)
Total Protein ELP: 6.1 g/dL (ref 6.0–8.5)

## 2020-11-30 ENCOUNTER — Other Ambulatory Visit: Payer: Medicare HMO

## 2020-11-30 ENCOUNTER — Inpatient Hospital Stay: Payer: Medicare HMO | Attending: Oncology

## 2020-11-30 ENCOUNTER — Other Ambulatory Visit: Payer: Self-pay

## 2020-11-30 ENCOUNTER — Ambulatory Visit: Payer: Medicare HMO

## 2020-11-30 VITALS — BP 98/67 | HR 70 | Temp 97.0°F | Resp 18 | Wt 155.0 lb

## 2020-11-30 DIAGNOSIS — C9000 Multiple myeloma not having achieved remission: Secondary | ICD-10-CM

## 2020-11-30 LAB — MAGNESIUM: Magnesium: 1.1 mg/dL — ABNORMAL LOW (ref 1.7–2.4)

## 2020-11-30 MED ORDER — SODIUM CHLORIDE 0.9 % IV SOLN: 6.0000 g | Freq: Once | INTRAVENOUS | Status: DC

## 2020-11-30 MED ORDER — HEPARIN SOD (PORK) LOCK FLUSH 100 UNIT/ML IV SOLN
INTRAVENOUS | Status: AC
  Filled 2020-11-30: qty 5

## 2020-11-30 MED ORDER — MAGNESIUM SULFATE 2 GM/50ML IV SOLN
2.0000 g | Freq: Once | INTRAVENOUS | Status: AC
  Administered 2020-11-30: 2 g via INTRAVENOUS
  Filled 2020-11-30: qty 50

## 2020-11-30 MED ORDER — HEPARIN SOD (PORK) LOCK FLUSH 100 UNIT/ML IV SOLN
500.0000 [IU] | Freq: Once | INTRAVENOUS | Status: AC
  Administered 2020-11-30: 500 [IU] via INTRAVENOUS
  Filled 2020-11-30: qty 5

## 2020-11-30 MED ORDER — SODIUM CHLORIDE 0.9 % IV SOLN
INTRAVENOUS | Status: DC
Start: 2020-11-30 — End: 2021-03-26
  Filled 2020-11-30 (×2): qty 250

## 2020-11-30 MED ORDER — MAGNESIUM SULFATE 4 GM/100ML IV SOLN
4.0000 g | Freq: Once | INTRAVENOUS | Status: AC
Start: 2020-11-30 — End: 2020-11-30
  Administered 2020-11-30: 4 g via INTRAVENOUS
  Filled 2020-11-30: qty 100

## 2020-11-30 NOTE — Patient Instructions (Signed)
Magnesium Sulfate injection What is this medicine? MAGNESIUM SULFATE (mag NEE zee um SUL fate) is an electrolyte injection commonly used to treat low magnesium levels in your blood. It is also used to prevent or control seizures in women with preeclampsia or eclampsia. This medicine may be used for other purposes; ask your health care provider or pharmacist if you have questions. What should I tell my health care provider before I take this medicine? They need to know if you have any of these conditions:  heart disease  history of irregular heart beat  kidney disease  an unusual or allergic reaction to magnesium sulfate, medicines, foods, dyes, or preservatives  pregnant or trying to get pregnant  breast-feeding How should I use this medicine? This medicine is for infusion into a vein. It is given by a health care professional in a hospital or clinic setting. Talk to your pediatrician regarding the use of this medicine in children. While this drug may be prescribed for selected conditions, precautions do apply. Overdosage: If you think you have taken too much of this medicine contact a poison control center or emergency room at once. NOTE: This medicine is only for you. Do not share this medicine with others. What if I miss a dose? This does not apply. What may interact with this medicine? This medicine may interact with the following medications:  certain medicines for anxiety or sleep  certain medicines for seizures like phenobarbital  digoxin  medicines that relax muscles for surgery  narcotic medicines for pain This list may not describe all possible interactions. Give your health care provider a list of all the medicines, herbs, non-prescription drugs, or dietary supplements you use. Also tell them if you smoke, drink alcohol, or use illegal drugs. Some items may interact with your medicine. What should I watch for while using this medicine? Your condition will be  monitored carefully while you are receiving this medicine. You may need blood work done while you are receiving this medicine. What side effects may I notice from receiving this medicine? Side effects that you should report to your doctor or health care professional as soon as possible:  allergic reactions like skin rash, itching or hives, swelling of the face, lips, or tongue  facial flushing  muscle weakness  signs and symptoms of low blood pressure like dizziness; feeling faint or lightheaded, falls; unusually weak or tired  signs and symptoms of a dangerous change in heartbeat or heart rhythm like chest pain; dizziness; fast or irregular heartbeat; palpitations; breathing problems  sweating This list may not describe all possible side effects. Call your doctor for medical advice about side effects. You may report side effects to FDA at 1-800-FDA-1088. Where should I keep my medicine? This drug is given in a hospital or clinic and will not be stored at home. NOTE: This sheet is a summary. It may not cover all possible information. If you have questions about this medicine, talk to your doctor, pharmacist, or health care provider.  2021 Elsevier/Gold Standard (2016-01-03 12:31:42)  

## 2020-12-01 ENCOUNTER — Encounter: Payer: Medicare PPO | Admitting: Internal Medicine

## 2020-12-04 ENCOUNTER — Encounter: Payer: Self-pay | Admitting: Internal Medicine

## 2020-12-04 ENCOUNTER — Encounter: Payer: Self-pay | Admitting: Hematology and Oncology

## 2020-12-04 DIAGNOSIS — E1122 Type 2 diabetes mellitus with diabetic chronic kidney disease: Secondary | ICD-10-CM | POA: Diagnosis not present

## 2020-12-04 DIAGNOSIS — I493 Ventricular premature depolarization: Secondary | ICD-10-CM | POA: Insufficient documentation

## 2020-12-04 DIAGNOSIS — N182 Chronic kidney disease, stage 2 (mild): Secondary | ICD-10-CM | POA: Diagnosis not present

## 2020-12-04 DIAGNOSIS — D631 Anemia in chronic kidney disease: Secondary | ICD-10-CM | POA: Diagnosis not present

## 2020-12-04 DIAGNOSIS — I10 Essential (primary) hypertension: Secondary | ICD-10-CM | POA: Diagnosis not present

## 2020-12-04 NOTE — Progress Notes (Signed)
Date:  12/05/2020   Name:  Sarah [name]   DOB:  22-Jan-1956   MRN:  [id number]   Chief Complaint: Annual Exam (Breast Exam. No pap. )  Sarah [name] is a 65 y.o. female who presents today for her Complete Annual Exam. She feels well. She reports exercising - not at this time ( moving to another home). She reports she is sleeping well. Breast complaints - none.  Overall feeling fairly well - still immunocompromised since transplant.    Mammogram: 11/2019  MDM DEXA: none Pap smear: discontinued Colonoscopy: 05/2017  Immunization History  Administered Date(s) Administered  . DTaP / Hep B / IPV 11/14/2020  . Hepatitis B, adult 07/09/2016, 09/10/2016, 07/08/2017  . HiB (PRP-T) 07/09/2016, 09/10/2016, 07/08/2017, 11/14/2020  . IPV 07/09/2016, 09/10/2016, 07/08/2017  . Influenza,inj,Quad PF,6+ Mos 03/23/2015, 06/18/2016, 04/08/2017, 06/30/2017, 03/31/2018, 04/12/2019, 04/03/2020  . MMR 07/08/2017  . PFIZER(Purple Top)SARS-COV-2 Vaccination 09/20/2019, 10/09/2019  . Pneumococcal Conjugate-13 03/23/2015, 07/09/2016, 01/07/2017, 03/11/2017, 11/14/2020  . Pneumococcal Polysaccharide-23 07/08/2017  . Td 08/20/2020  . Tdap 07/09/2016, 09/10/2016, 02/12/2018  . Zoster Recombinat (Shingrix) 11/19/2017, 02/12/2018    Hypertension This is a chronic problem. The problem is controlled. Pertinent negatives include no chest pain, headaches, palpitations or shortness of breath. Treatments tried: bisoprolol. The current treatment provides significant improvement. There are no compliance problems.  Hypertensive end-organ damage includes CAD/MI. There is no history of kidney disease.  Diabetes She presents for her follow-up diabetic visit. She has type 2 diabetes mellitus. Her disease course has been stable. Pertinent negatives for hypoglycemia include no dizziness, headaches, nervousness/anxiousness or tremors. Pertinent negatives for diabetes include no chest pain, no fatigue, no polydipsia and no  polyuria. When asked about current treatments, none (stopped metformin due to diarrhea; started glipizide) were reported. Her weight is stable.  Hyperlipidemia This is a chronic problem. Pertinent negatives include no chest pain or shortness of breath. Treatments tried: statin stopped when bone marrow transplant done.  Gastroesophageal Reflux She complains of heartburn. She reports no abdominal pain (epigastric pain), no chest pain, no coughing, no dysphagia, no sore throat or no wheezing. This is a recurrent problem. The problem occurs frequently. Pertinent negatives include no fatigue. She has tried a PPI for the symptoms. The treatment provided moderate (GI instructed her to increase to bid but needs new Rx) relief.    Lab Results  Component Value Date   CREATININE 1.08 (H) 11/23/2020   BUN 13 11/23/2020   NA 136 11/23/2020   K 4.1 11/23/2020   CL 102 11/23/2020   CO2 26 11/23/2020   Lab Results  Component Value Date   CHOL 186 11/30/2019   HDL 71 11/30/2019   LDLCALC 95 11/30/2019   TRIG 114 11/30/2019   CHOLHDL 2.6 11/30/2019   Lab Results  Component Value Date   TSH 2.105 08/20/2018   Lab Results  Component Value Date   HGBA1C 6.6 (H) 11/09/2020   Lab Results  Component Value Date   WBC 5.1 11/23/2020   HGB 11.0 (L) 11/23/2020   HCT 34.1 (L) 11/23/2020   MCV 82.0 11/23/2020   PLT 127 (L) 11/23/2020   Lab Results  Component Value Date   ALT 34 11/23/2020   AST 34 11/23/2020   ALKPHOS 101 11/23/2020   BILITOT 0.4 11/23/2020   ECHO 07/2020: Summary   1. Limited study to assess valvular function.   2. The left ventricle is normal in size with normal wall thickness.   3. The left ventricular systolic function  is borderline, LVEF is visually  estimated at 50-55%.   4. The aortic valve is functionally bicuspid with moderately thickened  leaflets with reduced excursion.   5. There is moderate to severe aortic valve stenosis.   6. The right ventricle is  normal in size, with normal systolic function.   ECG: NORMAL SINUS RHYTHM  WITHIN NORMAL LIMITS  WHEN COMPARED WITH ECG OF 20-Jun-2020 09:25,  RATE HAS DECREASED AND PREMATURE VENTRICULAR BEATS ARE NO LONGER PRESENT  Confirmed by Sarah [name] ([id number]) on 08/20/2020 6:52:48 PM    Review of Systems  Constitutional: Negative for chills, fatigue and fever.  HENT: Negative for congestion, hearing loss, sore throat, tinnitus, trouble swallowing and voice change.   Eyes: Negative for visual disturbance.  Respiratory: Negative for cough, chest tightness, shortness of breath and wheezing.   Cardiovascular: Negative for chest pain, palpitations and leg swelling.  Gastrointestinal: Positive for diarrhea and heartburn. Negative for abdominal pain (epigastric pain), constipation, dysphagia and vomiting.  Endocrine: Negative for polydipsia and polyuria.  Genitourinary: Negative for dysuria, frequency, genital sores, vaginal bleeding and vaginal discharge.  Musculoskeletal: Negative for arthralgias, gait problem and joint swelling.  Skin: Negative for color change and rash.  Neurological: Negative for dizziness, tremors, light-headedness and headaches.  Hematological: Negative for adenopathy. Does not bruise/bleed easily.  Psychiatric/Behavioral: Positive for sleep disturbance. Negative for dysphoric mood. The patient is not nervous/anxious.     Patient Active Problem List   Diagnosis Date Noted  . PVC (premature ventricular contraction) 12/04/2020  . Diarrhea 10/25/2020  . Encounter for antineoplastic chemotherapy 08/31/2020  . Neuropathy 08/20/2020  . Coronary artery disease involving native coronary artery of native heart 04/26/2020  . Yeast vaginitis 03/18/2020  . Hypotension 03/18/2020  . Hypokalemia 03/11/2020  . Malnutrition of moderate degree 03/03/2020  . Antineoplastic chemotherapy induced pancytopenia (Sarah [name]) 03/03/2020  . Dyspnea 03/02/2020  . Physical deconditioning 02/07/2020  .  Impaired nutrition 02/07/2020  . 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 Constipation 10/24/2019  . Encounter for antineoplastic immunotherapy 10/03/2019  . Hypocalcemia 08/28/2019  . Stage 3 chronic kidney disease (Sarah [name]) 05/12/2019  . Chronic nausea 12/17/2018  . Chemotherapy-induced neuropathy ([place]) 12/17/2018  . Hyperlipidemia associated with type 2 diabetes mellitus ([place]) 11/25/2018  . Anemia 10/21/2018  . Hypomagnesemia 08/20/2018  . Goals of care, counseling/discussion 08/20/2018  . B12 deficiency 08/20/2018  . Thrombocytopenia ([place]) 02/09/2018  . Benign essential HTN 08/21/2017  . Cardiomyopathy, idiopathic ([place]) 08/21/2017  . Carotid artery stenosis, asymptomatic, bilateral 08/06/2017  . Thyroid nodule 08/06/2017  . Cardiac syncope 07/25/2017  . Iron deficiency anemia due to chronic blood loss 05/13/2017  . Spinal stenosis of lumbar region with neurogenic claudication 04/09/2017  . 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 LBBB (left bundle branch block) 10/10/2016  . Long term prescription opiate use 09/07/2016  . Chronic pain syndrome 07/08/2016  . Pain medication agreement signed 07/08/2016  . Spondylosis of lumbar region without myelopathy or radiculopathy 07/08/2016  . Chronic diastolic CHF (congestive heart failure), NYHA class 2 ([place]) 06/05/2016  . LVH (left ventricular hypertrophy) due to hypertensive disease, with heart failure ([place]) 06/05/2016  . Severe aortic stenosis 06/05/2016  . 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[place]) 06/05/2016  . Environmental and seasonal allergies 04/19/2016  . Insomnia 04/19/2016  . Bicuspid aortic valve 04/19/2016  . Asthma 04/19/2016  . Aortic regurgitation 04/19/2016  . Type II diabetes mellitus with complication ([place]) [id number]/[id number]  . Depression, major, single episode, in partial remission ([place]) 04/12/2016  . Irritable bowel syndrome (  IBS) 04/12/2016  . Hepatic steatosis 04/12/2016  . Multiple myeloma not having  achieved remission ([place]) 04/02/2016  . 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[place]) 07/06/2015  . Stem cell transplant candidate 05/16/2015    Allergies  Allergen Reactions  . Oxycodone-Acetaminophen Anaphylaxis    Swelling and rash  . Celebrex [Celecoxib] Diarrhea  . Codeine   . Plerixafor     In 2016 during ASCT collection patient developed fever to 103.63F and required hospitalization  . Benadryl [Diphenhydramine] Palpitations  . Morphine Itching and Rash  . Ondansetron Diarrhea  . Tylenol [Acetaminophen] Itching and Rash    Past Surgical History:  Procedure Laterality Date  . ABDOMINAL HYSTERECTOMY    . 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 CARDIAC ELECTROPHYSIOLOGY MAPPING AND ABLATION    . CARPAL TUNNEL RELEASE Bilateral   . CHOLECYSTECTOMY  2008  . COLONOSCOPY WITH PROPOFOL N/A 05/07/2017   Procedure: COLONOSCOPY WITH PROPOFOL;  Surgeon: [name], MD;  Location: [facility] ENDOSCOPY;  Service: Gastroenterology;  Laterality: N/A;  . ESOPHAGOGASTRODUODENOSCOPY (EGD) WITH PROPOFOL N/A 05/07/2017   Procedure: ESOPHAGOGASTRODUODENOSCOPY (EGD) WITH PROPOFOL;  Surgeon: [name], MD;  Location[facility] ENDOSCOPY;  Service: Gastroenterology;  Laterality: N/A;  . FOOT SURGERY Bilateral   . INCONTINENCE SURGERY  2009  . INTERSTIM IMPLANT PLACEMENT    . other     over active bladder  . OTHER SURGICAL HISTORY     bladder stimulator   . PARTIAL HYSTERECTOMY  03/1996   fibroids  . PORTA CATH INSERTION N/A 03/10/2019   Procedure: PORTA CATH INSERTION;  Surgeon: Algernon Huxley, MD;  Location: [facility];  Service: Cardiovascular;  Laterality: N/A;  . TONSILLECTOMY  2007    Social History   Tobacco Use  . Smoking status: Former Smoker    Packs/day: 1.00    Years: 20.00    Pack years: 20.00    Types: Cigarettes    Quit date: 07/02/1991    Years since quitting: 29.4  . Smokeless tobacco: Never Used  Vaping Use  . Vaping Use: Never used  Substance Use Topics  . Alcohol  use: Yes    Comment: occasionally  . Drug use: No     Medication list has been reviewed and updated.  Current Meds  Medication Sig  . acetaminophen (TYLENOL) 500 MG tablet Take 500 mg by mouth every 6 (six) hours as needed.  Marland Kitchen acyclovir (ZOVIRAX) 400 MG tablet Take 400 mg by mouth 2 (two) times daily.  Marland Kitchen allopurinol (ZYLOPRIM) 100 MG tablet TAKE 1 TABLET(100 MG) BY MOUTH DAILY as needed  . bisoprolol (ZEBETA) 5 MG tablet Take 5 mg by mouth daily.  . diclofenac sodium (VOLTAREN) 1 % GEL Apply 2 g topically 4 (four) times daily.  . diphenhydrAMINE (BENADRYL) 50 MG capsule Take 50 mg by mouth as needed.  . diphenoxylate-atropine (LOMOTIL) 2.5-0.025 MG tablet Take 2 tablets by mouth 4 (four) times daily as needed for diarrhea or loose stools.  . fexofenadine (ALLEGRA) 180 MG tablet TAKE 1 TABLET(180 MG) BY MOUTH DAILY  . FLUoxetine (PROZAC) 40 MG capsule TAKE 1 CAPSULE EVERY DAY  . fluticasone (FLONASE) 50 MCG/ACT nasal spray Place 2 sprays into both nostrils daily.  Marland Kitchen glucose blood test strip   . lactulose (CHRONULAC) 10 GM/15ML solution Take 20 g by mouth daily as needed.  . lidocaine (LIDODERM) 5 % 1 patch daily.  Marland Kitchen loperamide (IMODIUM) 2 MG capsule Take 1 capsule (2 mg total) by mouth 4 (four) times daily  as needed for diarrhea or loose stools.  . magnesium chloride (SLOW-MAG) 64 MG TBEC SR tablet Take 1 tablet (64 mg total) by mouth 2 (two) times daily.  . methocarbamol (ROBAXIN) 500 MG tablet TAKE 1 TABLET FOUR TIMES DAILY.  . Multiple Vitamin (MULTIVITAMIN) tablet Take 1 tablet by mouth daily.  . Multiple Vitamins-Minerals (HAIR/SKIN/NAILS) TABS Take 1 tablet by mouth daily.  Marland Kitchen omeprazole (PRILOSEC) 40 MG capsule TAKE 1 CAPSULE EVERY DAY  . promethazine (PHENERGAN) 25 MG tablet Take 1 tablet (25 mg total) by mouth every 6 (six) hours as needed for nausea or vomiting.  . promethazine-dextromethorphan (PROMETHAZINE-DM) 6.25-15 MG/5ML syrup Take 5 mLs by mouth 4 (four) times daily as  needed for cough.  . traZODone (DESYREL) 100 MG tablet Take 1 tablet (100 mg total) by mouth at bedtime.  . [DISCONTINUED] glipiZIDE (GLUCOTROL XL) 2.5 MG 24 hr tablet Take 2.5 mg by mouth daily with breakfast.  . [DISCONTINUED] MODERNA COVID-19 VACCINE 100 MCG/0.5ML injection     PHQ 2/9 Scores 04/03/2020 02/07/2020 11/30/2019 10/27/2019  PHQ - 2 Score 2 1 0 2  PHQ- 9 Score 2 8 6 5     GAD 7 : Generalized Anxiety Score 04/03/2020 11/30/2019 10/27/2019 01/04/2019  Nervous, Anxious, on Edge 3 3 3 2   Control/stop worrying 3 0 3 1  Worry too much - different things 3 0 3 2  Trouble relaxing 0 3 0 1  Restless 0 3 0 1  Easily annoyed or irritable 0 1 0 0  Afraid - awful might happen 0 0 3 0  Total GAD 7 Score 9 10 12 7   Anxiety Difficulty Not difficult at all Not difficult at all Not difficult at all Somewhat difficult    BP Readings from Last 3 Encounters:  12/05/20 122/72  11/30/20 98/67  11/23/20 128/72    Physical Exam Vitals and nursing note reviewed.  Constitutional:      General: She is not in acute distress.    Appearance: She is well-developed.  HENT:     Head: Normocephalic and atraumatic.     Right Ear: Tympanic membrane and ear canal normal.     Left Ear: Tympanic membrane and ear canal normal.     Nose:     Right Sinus: No maxillary sinus tenderness.     Left Sinus: No maxillary sinus tenderness.  Eyes:     General: No scleral icterus.       Right eye: No discharge.        Left eye: No discharge.     Conjunctiva/sclera: Conjunctivae normal.  Neck:     Thyroid: No thyromegaly.     Vascular: No carotid bruit.  Cardiovascular:     Rate and Rhythm: Normal rate and regular rhythm.     Pulses: Normal pulses.     Heart sounds: Normal heart sounds.  Pulmonary:     Effort: Pulmonary effort is normal. No respiratory distress.     Breath sounds: No wheezing.  Chest:  Breasts:     Right: No mass, nipple discharge, skin change or tenderness.     Left: No mass, nipple  discharge, skin change or tenderness.    Abdominal:     General: Bowel sounds are normal.     Palpations: Abdomen is soft. There is no mass.     Tenderness: There is abdominal tenderness in the epigastric area. There is no guarding or rebound.  Musculoskeletal:     Cervical back: Normal range of motion. No erythema.  Right lower leg: No edema.     Left lower leg: No edema.  Lymphadenopathy:     Cervical: No cervical adenopathy.  Skin:    General: Skin is warm and dry.     Capillary Refill: Capillary refill takes less than 2 seconds.     Findings: No rash.  Neurological:     General: No focal deficit present.     Mental Status: She is alert and oriented to person, place, and time.     Cranial Nerves: No cranial nerve deficit.     Sensory: No sensory deficit.     Deep Tendon Reflexes: Reflexes are normal and symmetric.  Psychiatric:        Attention and Perception: Attention normal.        Mood and Affect: Mood normal.     Wt Readings from Last 3 Encounters:  12/05/20 150 lb 6.4 oz (68.2 kg)  11/30/20 155 lb (70.3 kg)  11/23/20 155 lb 10.3 oz (70.6 kg)    BP 122/72   Pulse 67   Temp 98.5 F (36.9 C) (Oral)   Ht 5' 2.5" (1.588 m)   Wt 150 lb 6.4 oz (68.2 kg)   SpO2 98%   BMI 27.07 kg/m   Assessment and Plan: 1. Annual physical exam Immunizations are up to date Colonoscopy done in the past 5 years -  2. Benign essential HTN Clinically stable exam with well controlled BP. Tolerating medications without side effects at this time. Pt to continue current regimen and low sodium diet; benefits of regular exercise as able discussed.  3. Encounter for screening mammogram for breast cancer Schedule at [facility]; Future  4. Type II diabetes mellitus with complication (HCC) Off of medications with recent A1C 6.6 Would continue with diet alone for now; monitor BS at home Repeat A1C in 3-4 months  5. Hyperlipidemia associated with type 2  diabetes mellitus ([place]) Off of statin due to interactions with medications after BMT Will recheck lipids and advise - Lipid panel  6. Stage 3a chronic kidney disease ([place]) This has been stable in the mid-low 50's She is followed by Nephrology  7. Depression, major, single episode, in partial remission (HCC) Clinically stable on current regimen with good control of symptoms, No SI or HI. Will continue current therapy with Prozac and Trazodone  8. Primary insomnia - traZODone (DESYREL) 100 MG tablet; Take 1 tablet (100 mg total) by mouth at bedtime.  Dispense: 90 tablet; Refill: 1  9. Cardiomyopathy, idiopathic ([place]) Uncertain diagnosis - she is being followed by Cardiology for AS.  Will resolve based on ECHO Recent ECHO normal LV and RV with normal function EF 50-55% Moderate to severe AS  10. Gastroesophageal reflux disease without esophagitis Increase omeprazole to bid - omeprazole (PRILOSEC) 40 MG capsule; Take 1 capsule (40 mg total) by mouth in the morning and at bedtime.  Dispense: 180 capsule; Refill: 1  11. 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[place]) Followed closely by Oncology  12. Thrombocytopenia, unspecified ([place]) Platelets have been modestly low but stable. No bleeding issues noted.   Partially dictated using Editor, commissioning. Any errors are unintentional.  Halina Maidens, MD [facility]  12/05/2020

## 2020-12-05 ENCOUNTER — Encounter: Payer: Self-pay | Admitting: Internal Medicine

## 2020-12-05 ENCOUNTER — Ambulatory Visit (INDEPENDENT_AMBULATORY_CARE_PROVIDER_SITE_OTHER): Payer: Medicare HMO | Admitting: Internal Medicine

## 2020-12-05 ENCOUNTER — Other Ambulatory Visit: Payer: Self-pay

## 2020-12-05 ENCOUNTER — Other Ambulatory Visit: Payer: Self-pay | Admitting: Otolaryngology

## 2020-12-05 VITALS — BP 122/72 | HR 67 | Temp 98.5°F | Ht 62.5 in | Wt 150.4 lb

## 2020-12-05 DIAGNOSIS — Z9484 Stem cells transplant status: Secondary | ICD-10-CM | POA: Diagnosis not present

## 2020-12-05 DIAGNOSIS — I428 Other cardiomyopathies: Secondary | ICD-10-CM

## 2020-12-05 DIAGNOSIS — E118 Type 2 diabetes mellitus with unspecified complications: Secondary | ICD-10-CM | POA: Diagnosis not present

## 2020-12-05 DIAGNOSIS — N1831 Chronic kidney disease, stage 3a: Secondary | ICD-10-CM

## 2020-12-05 DIAGNOSIS — J019 Acute sinusitis, unspecified: Secondary | ICD-10-CM | POA: Diagnosis not present

## 2020-12-05 DIAGNOSIS — I429 Cardiomyopathy, unspecified: Secondary | ICD-10-CM

## 2020-12-05 DIAGNOSIS — I10 Essential (primary) hypertension: Secondary | ICD-10-CM | POA: Diagnosis not present

## 2020-12-05 DIAGNOSIS — E1169 Type 2 diabetes mellitus with other specified complication: Secondary | ICD-10-CM | POA: Diagnosis not present

## 2020-12-05 DIAGNOSIS — Z0000 Encounter for general adult medical examination without abnormal findings: Secondary | ICD-10-CM

## 2020-12-05 DIAGNOSIS — D696 Thrombocytopenia, unspecified: Secondary | ICD-10-CM | POA: Diagnosis not present

## 2020-12-05 DIAGNOSIS — F324 Major depressive disorder, single episode, in partial remission: Secondary | ICD-10-CM

## 2020-12-05 DIAGNOSIS — E041 Nontoxic single thyroid nodule: Secondary | ICD-10-CM | POA: Diagnosis not present

## 2020-12-05 DIAGNOSIS — E785 Hyperlipidemia, unspecified: Secondary | ICD-10-CM | POA: Diagnosis not present

## 2020-12-05 DIAGNOSIS — F5101 Primary insomnia: Secondary | ICD-10-CM

## 2020-12-05 DIAGNOSIS — Z1231 Encounter for screening mammogram for malignant neoplasm of breast: Secondary | ICD-10-CM | POA: Diagnosis not present

## 2020-12-05 DIAGNOSIS — K219 Gastro-esophageal reflux disease without esophagitis: Secondary | ICD-10-CM | POA: Diagnosis not present

## 2020-12-05 MED ORDER — OMEPRAZOLE 40 MG PO CPDR: 1.0000 | DELAYED_RELEASE_CAPSULE | Freq: Two times a day (BID) | ORAL | 1 refills | Status: DC

## 2020-12-05 MED ORDER — TRAZODONE HCL 100 MG PO TABS: 100.0000 mg | ORAL_TABLET | Freq: Every day | ORAL | 1 refills | Status: DC

## 2020-12-06 ENCOUNTER — Telehealth: Payer: Self-pay

## 2020-12-06 ENCOUNTER — Other Ambulatory Visit: Payer: Self-pay

## 2020-12-06 DIAGNOSIS — R197 Diarrhea, unspecified: Secondary | ICD-10-CM

## 2020-12-06 LAB — LIPID PANEL
Chol/HDL Ratio: 3 ratio (ref 0.0–4.4)
Cholesterol, Total: 177 mg/dL (ref 100–199)
HDL: 59 mg/dL (ref >39)
LDL Chol Calc (NIH): 94 mg/dL (ref 0–99)
Triglycerides: 140 mg/dL (ref 0–149)
VLDL Cholesterol Cal: 24 mg/dL (ref 5–40)

## 2020-12-06 NOTE — Telephone Encounter (Signed)
After receiving a MyChart message from the patient stating that she has had diarrhea with no help taking Lomotil, she wanted advise on what to do next. I then asked for advise from Dr. Marius Ditch since she is covering for his patient's and she recommended for patient to get a colonoscopy done ASAP. I then called patient and asked her if she was in agreeable and she stated that she was. Therefore, I was able to schedule her colonoscopy on his first available slot being June 21 st. Patient was in agreeable on the date. However, patient stated that she would like to do a Miralax prep with Gatorade since she has history of vomiting all of the other preps. Patient also stated that when she did the last colonoscopy with Dr. Vicente Males, he was okay with that prep. I told her that I would ask Dr. Vicente Males and then call her back with an answer. Patient understood and had no further questions.  Dr. Vicente Males, please advise in reference to the Miralax and Gatorade prep being allowed or not by you. Thank you.

## 2020-12-07 ENCOUNTER — Encounter: Payer: Self-pay | Admitting: Nurse Practitioner

## 2020-12-07 ENCOUNTER — Other Ambulatory Visit: Payer: Self-pay | Admitting: Internal Medicine

## 2020-12-07 ENCOUNTER — Inpatient Hospital Stay: Payer: Medicare HMO

## 2020-12-07 ENCOUNTER — Inpatient Hospital Stay: Payer: Medicare HMO | Attending: Nurse Practitioner | Admitting: Nurse Practitioner

## 2020-12-07 ENCOUNTER — Other Ambulatory Visit: Payer: Self-pay

## 2020-12-07 ENCOUNTER — Other Ambulatory Visit: Payer: Medicare HMO

## 2020-12-07 VITALS — BP 118/65 | HR 66 | Temp 98.4°F | Resp 18 | Wt 151.9 lb

## 2020-12-07 DIAGNOSIS — C9000 Multiple myeloma not having achieved remission: Secondary | ICD-10-CM | POA: Insufficient documentation

## 2020-12-07 DIAGNOSIS — Z79899 Other long term (current) drug therapy: Secondary | ICD-10-CM | POA: Insufficient documentation

## 2020-12-07 DIAGNOSIS — K521 Toxic gastroenteritis and colitis: Secondary | ICD-10-CM | POA: Insufficient documentation

## 2020-12-07 DIAGNOSIS — E1122 Type 2 diabetes mellitus with diabetic chronic kidney disease: Secondary | ICD-10-CM | POA: Insufficient documentation

## 2020-12-07 DIAGNOSIS — D709 Neutropenia, unspecified: Secondary | ICD-10-CM | POA: Insufficient documentation

## 2020-12-07 DIAGNOSIS — Z822 Family history of deafness and hearing loss: Secondary | ICD-10-CM | POA: Insufficient documentation

## 2020-12-07 DIAGNOSIS — Z800 Family history of malignant neoplasm of digestive organs: Secondary | ICD-10-CM | POA: Insufficient documentation

## 2020-12-07 DIAGNOSIS — I129 Hypertensive chronic kidney disease with stage 1 through stage 4 chronic kidney disease, or unspecified chronic kidney disease: Secondary | ICD-10-CM | POA: Diagnosis not present

## 2020-12-07 DIAGNOSIS — Z9049 Acquired absence of other specified parts of digestive tract: Secondary | ICD-10-CM | POA: Diagnosis not present

## 2020-12-07 DIAGNOSIS — Z888 Allergy status to other drugs, medicaments and biological substances status: Secondary | ICD-10-CM | POA: Diagnosis not present

## 2020-12-07 DIAGNOSIS — N1831 Chronic kidney disease, stage 3a: Secondary | ICD-10-CM | POA: Diagnosis not present

## 2020-12-07 DIAGNOSIS — T451X5A Adverse effect of antineoplastic and immunosuppressive drugs, initial encounter: Secondary | ICD-10-CM | POA: Diagnosis not present

## 2020-12-07 DIAGNOSIS — R339 Retention of urine, unspecified: Secondary | ICD-10-CM | POA: Diagnosis not present

## 2020-12-07 DIAGNOSIS — Z5112 Encounter for antineoplastic immunotherapy: Secondary | ICD-10-CM | POA: Diagnosis not present

## 2020-12-07 DIAGNOSIS — Z886 Allergy status to analgesic agent status: Secondary | ICD-10-CM | POA: Insufficient documentation

## 2020-12-07 DIAGNOSIS — Z8379 Family history of other diseases of the digestive system: Secondary | ICD-10-CM | POA: Insufficient documentation

## 2020-12-07 DIAGNOSIS — K529 Noninfective gastroenteritis and colitis, unspecified: Secondary | ICD-10-CM | POA: Insufficient documentation

## 2020-12-07 DIAGNOSIS — Z5111 Encounter for antineoplastic chemotherapy: Secondary | ICD-10-CM | POA: Diagnosis not present

## 2020-12-07 DIAGNOSIS — D801 Nonfamilial hypogammaglobulinemia: Secondary | ICD-10-CM | POA: Insufficient documentation

## 2020-12-07 DIAGNOSIS — E538 Deficiency of other specified B group vitamins: Secondary | ICD-10-CM | POA: Insufficient documentation

## 2020-12-07 DIAGNOSIS — Z803 Family history of malignant neoplasm of breast: Secondary | ICD-10-CM | POA: Insufficient documentation

## 2020-12-07 DIAGNOSIS — I350 Nonrheumatic aortic (valve) stenosis: Secondary | ICD-10-CM | POA: Diagnosis not present

## 2020-12-07 DIAGNOSIS — Z885 Allergy status to narcotic agent status: Secondary | ICD-10-CM | POA: Insufficient documentation

## 2020-12-07 DIAGNOSIS — Z841 Family history of disorders of kidney and ureter: Secondary | ICD-10-CM | POA: Insufficient documentation

## 2020-12-07 DIAGNOSIS — Z9481 Bone marrow transplant status: Secondary | ICD-10-CM | POA: Diagnosis not present

## 2020-12-07 DIAGNOSIS — Z8249 Family history of ischemic heart disease and other diseases of the circulatory system: Secondary | ICD-10-CM | POA: Insufficient documentation

## 2020-12-07 DIAGNOSIS — Z833 Family history of diabetes mellitus: Secondary | ICD-10-CM | POA: Insufficient documentation

## 2020-12-07 DIAGNOSIS — Z9484 Stem cells transplant status: Secondary | ICD-10-CM | POA: Insufficient documentation

## 2020-12-07 DIAGNOSIS — Z801 Family history of malignant neoplasm of trachea, bronchus and lung: Secondary | ICD-10-CM | POA: Insufficient documentation

## 2020-12-07 DIAGNOSIS — Z87891 Personal history of nicotine dependence: Secondary | ICD-10-CM | POA: Insufficient documentation

## 2020-12-07 LAB — CBC WITH DIFFERENTIAL/PLATELET
Abs Immature Granulocytes: 0.02 10*3/uL (ref 0.00–0.07)
Basophils Absolute: 0 10*3/uL (ref 0.0–0.1)
Basophils Relative: 0 %
Eosinophils Absolute: 0.1 10*3/uL (ref 0.0–0.5)
Eosinophils Relative: 3 %
HCT: 34.3 % — ABNORMAL LOW (ref 36.0–46.0)
Hemoglobin: 11.3 g/dL — ABNORMAL LOW (ref 12.0–15.0)
Immature Granulocytes: 0 %
Lymphocytes Relative: 18 %
Lymphs Abs: 0.9 10*3/uL (ref 0.7–4.0)
MCH: 26.9 pg (ref 26.0–34.0)
MCHC: 32.9 g/dL (ref 30.0–36.0)
MCV: 81.7 fL (ref 80.0–100.0)
Monocytes Absolute: 0.4 10*3/uL (ref 0.1–1.0)
Monocytes Relative: 8 %
Neutro Abs: 3.3 10*3/uL (ref 1.7–7.7)
Neutrophils Relative %: 71 %
Platelets: 129 10*3/uL — ABNORMAL LOW (ref 150–400)
RBC: 4.2 MIL/uL (ref 3.87–5.11)
RDW: 16.7 % — ABNORMAL HIGH (ref 11.5–15.5)
WBC: 4.7 10*3/uL (ref 4.0–10.5)
nRBC: 0 % (ref 0.0–0.2)

## 2020-12-07 LAB — COMPREHENSIVE METABOLIC PANEL
ALT: 28 U/L (ref 0–44)
AST: 31 U/L (ref 15–41)
Albumin: 4.1 g/dL (ref 3.5–5.0)
Alkaline Phosphatase: 92 U/L (ref 38–126)
Anion gap: 12 (ref 5–15)
BUN: 23 mg/dL (ref 8–23)
CO2: 20 mmol/L — ABNORMAL LOW (ref 22–32)
Calcium: 8.6 mg/dL — ABNORMAL LOW (ref 8.9–10.3)
Chloride: 106 mmol/L (ref 98–111)
Creatinine, Ser: 1.09 mg/dL — ABNORMAL HIGH (ref 0.44–1.00)
GFR, Estimated: 57 mL/min — ABNORMAL LOW (ref >60)
Glucose, Bld: 138 mg/dL — ABNORMAL HIGH (ref 70–99)
Potassium: 4.1 mmol/L (ref 3.5–5.1)
Sodium: 138 mmol/L (ref 135–145)
Total Bilirubin: 0.3 mg/dL (ref 0.3–1.2)
Total Protein: 6.7 g/dL (ref 6.5–8.1)

## 2020-12-07 LAB — MAGNESIUM: Magnesium: 1.1 mg/dL — ABNORMAL LOW (ref 1.7–2.4)

## 2020-12-07 MED ORDER — MAGNESIUM SULFATE 4 GM/100ML IV SOLN
4.0000 g | Freq: Once | INTRAVENOUS | Status: AC
  Administered 2020-12-07: 4 g via INTRAVENOUS
  Filled 2020-12-07: qty 100

## 2020-12-07 MED ORDER — SODIUM CHLORIDE 0.9 % IV SOLN: 6.0000 g | Freq: Once | INTRAVENOUS | Status: DC

## 2020-12-07 MED ORDER — BORTEZOMIB CHEMO IV INJECTION 3.5 MG (1MG/ML-GENERIC)
2.0000 mg | Freq: Once | INTRAVENOUS | Status: AC
  Administered 2020-12-07: 2 mg via INTRAVENOUS
  Filled 2020-12-07: qty 2

## 2020-12-07 MED ORDER — SODIUM CHLORIDE 0.9 % IV SOLN
Freq: Once | INTRAVENOUS | Status: AC
  Filled 2020-12-07: qty 250

## 2020-12-07 MED ORDER — MAGNESIUM SULFATE 2 GM/50ML IV SOLN
2.0000 g | Freq: Once | INTRAVENOUS | Status: AC
Start: 2020-12-07 — End: 2020-12-07
  Administered 2020-12-07: 2 g via INTRAVENOUS
  Filled 2020-12-07: qty 50

## 2020-12-07 MED ORDER — SODIUM CHLORIDE 0.9 % IV SOLN
25.0000 mg | Freq: Once | INTRAVENOUS | Status: AC
  Administered 2020-12-07: 25 mg via INTRAVENOUS
  Filled 2020-12-07: qty 1

## 2020-12-07 MED ORDER — HEPARIN SOD (PORK) LOCK FLUSH 100 UNIT/ML IV SOLN
INTRAVENOUS | Status: AC
  Filled 2020-12-07: qty 5

## 2020-12-07 MED ORDER — SODIUM CHLORIDE 0.9% FLUSH
10.0000 mL | INTRAVENOUS | Status: DC | PRN
  Administered 2020-12-07: 10 mL via INTRAVENOUS
  Filled 2020-12-07: qty 10

## 2020-12-07 MED ORDER — HEPARIN SOD (PORK) LOCK FLUSH 100 UNIT/ML IV SOLN
500.0000 [IU] | Freq: Once | INTRAVENOUS | Status: AC
  Administered 2020-12-07: 500 [IU] via INTRAVENOUS
  Filled 2020-12-07: qty 5

## 2020-12-07 NOTE — Progress Notes (Signed)
Patient here for oncology follow-up appointment, expresses concerns aof N/V/D, SOB and dizziness

## 2020-12-07 NOTE — Progress Notes (Signed)
Per Traci Sermon, RN per Beckey Rutter, NP, patient is to get Magnesium 6g and proceed with Velcade.  B12 result on 11/23/20 - 921. Per Beckey Rutter, NP, hold B12 injection today.

## 2020-12-07 NOTE — Patient Instructions (Signed)
[facility]  Discharge Instructions: Thank you for choosing [facility] to provide your oncology and hematology care.  If you have a lab appointment with the Cancer Center, please go directly to the Cancer Center and check in at the registration area.  Wear comfortable clothing and clothing appropriate for easy access to any Portacath or PICC line.   We strive to give you quality time with your provider. You may need to reschedule your appointment if you arrive late (15 or more minutes).  Arriving late affects you and other patients whose appointments are after yours.  Also, if you miss three or more appointments without notifying the office, you may be dismissed from the clinic at the provider's discretion.      For prescription refill requests, have your pharmacy contact our office and allow 72 hours for refills to be completed.    Today you received the following chemotherapy and/or immunotherapy agents Velcade      To help prevent nausea and vomiting after your treatment, we encourage you to take your nausea medication as directed.  BELOW ARE SYMPTOMS THAT SHOULD BE REPORTED IMMEDIATELY: *FEVER GREATER THAN 100.4 F (38 C) OR HIGHER *CHILLS OR SWEATING *NAUSEA AND VOMITING THAT IS NOT CONTROLLED WITH YOUR NAUSEA MEDICATION *UNUSUAL SHORTNESS OF BREATH *UNUSUAL BRUISING OR BLEEDING *URINARY PROBLEMS (pain or burning when urinating, or frequent urination) *BOWEL PROBLEMS (unusual diarrhea, constipation, pain near the anus) TENDERNESS IN MOUTH AND THROAT WITH OR WITHOUT PRESENCE OF ULCERS (sore throat, sores in mouth, or a toothache) UNUSUAL RASH, SWELLING OR PAIN  UNUSUAL VAGINAL DISCHARGE OR ITCHING   Items with * indicate a potential emergency and should be followed up as soon as possible or go to the Emergency Department if any problems should occur.  Please show the CHEMOTHERAPY ALERT CARD or IMMUNOTHERAPY ALERT CARD at check-in to  the Emergency Department and triage nurse.  Should you have questions after your visit or need to cancel or reschedule your appointment, please contact [facility]  [phone] and follow the prompts.  Office hours are 8:00 a.m. to 4:30 p.m. Monday - Friday. Please note that voicemails left after 4:00 p.m. may not be returned until the following business day.  We are closed weekends and major holidays. You have access to a nurse at all times for urgent questions. Please call the main number to the clinic [phone] and follow the prompts.  For any non-urgent questions, you may also contact your provider using MyChart. We now offer e-Visits for anyone 18 and older to request care online for non-urgent symptoms. For details visit [email or web address].   Also download the MyChart app! Go to the app store, search "MyChart", open the app, select [facility], and log in with your MyChart username and password.  Due to Covid, a mask is required upon entering the hospital/clinic. If you do not have a mask, one will be given to you upon arrival. For doctor visits, patients may have 1 support person aged 18 or older with them. For treatment visits, patients cannot have anyone with them due to current Covid guidelines and our immunocompromised population.  

## 2020-12-07 NOTE — Progress Notes (Signed)
[place] at [facility] Day:  12/07/2020   Referring physician: Glean Hess, MD  Chief Complaint: Sarah [name] is a 65 y.o. 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   Initial bone marrow revealed 80% plasma cells.  Lambda free light chains were 1340.  She had nephrotic range proteinuria.  She initially underwent induction with RVD.  Revlimid maintenance was discontinued on 01/21/2017 secondary to intolerance.     07/19/2019 Bone marrow aspirate and biopsy on  revealed a normocellular marrow with but increased lambda-restricted plasma cells (9% aspirate, 40% CD138 immunohistochemistry).  Findings were consistent with recurrent plasma cell myeloma.  Flow cytometry revealed no monoclonal B-cell or phenotypically aberrant T-cell population. Cytogenetics were 49, XX (normal).  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    Lambda light chains have been followed: 22.2 (ratio 0.56) on 07/03/2017, 30.8 (ratio 0.78) on 09/02/2017, 36.9 (ratio 0.40) on 10/21/2017, 37.4 (ratio 0.41) on 12/16/2017, 70.7(ratio 0.31)  on 02/17/2018, 64.2 (ratio 0.27) on 04/07/2018, 78.9 (ratio 0.18) on 05/26/2018, 128.8 (ratio 0.17) on 08/06/2018, 181.5 (ratio 0.13) on 10/08/2018, 130.9 (ratio 0.13) on 10/20/2018, 160.7 (ratio 0.10) on 12/09/2018, 236.6 (ratio 0.07) on 02/01/2019, 363.6 (ratio  0.04) on 03/22/2019, 404.8 (ratio 0.04) on 04/05/2019, 420.7 (ratio 0.03) on 05/24/2019, 573.4 (ratio 0.03) on 06/23/2019, 451.05 (ratio 0.02) on 08/20/2019, 47.2 (ratio 0.13) on 10/25/2019, 22.4 (ratio 0.21) on 11/25/2019, 16.5 (ratio 0.33) on 01/10/2020, 14.6 (ratio 0.34) on 02/07/2020, 13.1 (ratio 0.31) on 03/07/2020, 10.1 (ratio 0.38) on 04/10/2020, and 9.5 (ratio 0.21) on 06/19/2020.   24 hour UPEP on 06/03/2019 revealed kappa free light chains 95.76, lambda free light chains 1,260.71, and ratio 0.08.  24 hour UPEP on 08/23/2019 revealed total protein of 782 mg/24 hrs with lambda free light chains 1,084.16 mg/L and ratio of 0.10 (1.03-31.76).  M spike in urine was 46.1% (361 mg/24 hrs).    Bone survey on 04/08/2016 and 05/28/2017 revealed no definite lytic lesion seen in the visualized skeleton.  Bone survey on 11/19/2018 revealed no suspicious lucent lesions and no acute bony abnormality.  PET scan on 07/12/2019 revealed no focal metabolic activity to suggest active myeloma within the skeleton. There were no lytic lesions identified on the CT portion of the exam or soft tissue plasmacytomas. There was no evidence of multiple myeloma.    Pretreatment RBC phenotype on 09/23/2019 was positive for C, e, DUFFY B, KIDD B, M, S, and s antigen; negative for c, E, KELL, DUFFY A, KIDD A, and N antigen.    09/27/2019 - 10/25/2019; 12/09/2019 - 03/13/2020 6 cycles of daratumumab and hyaluronidase-fihj, Pomalyst, and Decadron (DPd) .  Cycle #1 was complicated by fever and neutropenia requiring admission.  Cycle #2 was complicated by pneumonia requiring admission.  Cycle #6 was complicated with an ER evaluation for an elevated lactic acid.   05/10/2020 second autologous stem cell transplant at [facility]   She underwent conditioning with melphalan  140 mg/m2.    She is s/p week #3 Velcade maintenance (began 08/31/2020 - 09/28/2020).  She has no increase in neuropathy.  She has severe aortic stenosis.  Echo on  05/21/2020 revealed severe aortic valve stenosis (tricuspid valve with 2 leaflets fused) and an EF of 45-50%. Cardiology is following for a possible TAVR in the future.  Echo on 07/05/2020 revealed moderate to severe aortic stenosis with an EF of 50-55%.  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 Zometa was discontinued in 01/2017.  She has chronic nausea on Phenergan.     B12 deficiency.  B12 was 254 on 04/09/2017, 295 on 08/20/2018, and 391 on 10/08/2018.  She was on oral B12.  She received B12 monthly (last 09/14/2020).  Folate was 12.1 on 01/10/2020.    She has iron deficiency.  Ferritin was 32 on 07/01/2019.  She received Venofer on 07/15/2019 and 07/22/2019.   She has hypogammaglobulinemia.  IgG was 245 on 11/25/2019.  She received monthly IVIG (07/22/021 - 03/22/2020).  She received IVIG 400 mg/kg on 01/20/2020, 200 mg/kg on 02/17/2020, and 300 mg/kg on 03/22/2020.  IVIG on 40/98/1191 was complicated by acute renal failure. IgG trough level was 418 on 02/17/2020.  10/15/2019 - 10/20/2019 She was admitted to [facility] with fever and neutropenia.  Cultures were negative.  CXR was negative. She received broad spectrum antibiotics and daily Granix.  She received IVF for acute renal insufficiency due to diarrhea and dehydration.  Creatine was 1.66 on admission and 1.12 on discharge.    03/01/2020 - 03/05/2020 admitted to [facility] from with fever and neutropenia. CXR revealed no active cardiopulmonary disease. Chest CT with contrast revealed no acute intrathoracic pathology. There were findings which could be suggestive of prior granulomatous disease. She was treated with Cefepime and Vancomycin, then switched to ciprofloxacin on 03/03/2020.   05/08/2020 - 12/05/2021The patient was admitted to [facility] from  for autologous bone marrow transplant.   She received conditioning with melphalan 140 mg/m2.  Bone marrow transplant was on 05/10/2020. The patient developed fevers daily from  05/19/2020 - 05/24/2020. Blood cultures were + for strept sanguis bacteremia.  She was treated cefepime, vancomycin, and solumedrol for possible engraftment syndrome. Cefepime was switched to ceftriaxone; she completed 2 weeks of ceftriaxone on 06/03/2020. She had chemotherapy induced diarrhea.  She experienced urinary retention.  She received the [place] COVID-19 vaccine on 09/20/2019 and 10/09/2019. She received the Booster on 08/10/2020. She received the tetanus shot on 08/20/2020. She received the shingles vaccine recently.   INTERVAL HISTORY Sarah [name] is a 65 y.o. female who returns to clinic for follow-up visit for management of multiple myeloma and consideration of continuation of Velcade.  She continues to tolerate treatment well except for ongoing hypomagnesemia and chronic diarrhea.  She is followed by GI for diarrhea and anticipates colonoscopy in the future.  Neuropathy symptoms are stable.  She has chronic hypomagnesemia and requires IV magnesium she was intolerant Slow-Mag with worsening of diarrhea.  Otherwise she continues to feel at baseline.  No interval infections or fevers.  No urinary complaints.  No nausea or vomiting.  No chest pain or shortness of breath.  No abnormal bleeding or bruising.  No other specific complaints today.  She has recently moved to [place] and wishes to relocate her care to the [place] center from [facility].  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   Due to Zometa   Cardiomyopathy, idiopathic ([place]) 08/21/2017   Overview:  Septal and apical hypokinesis with ef 40%   CHF (congestive heart failure) (HCC)    CKD (chronic kidney disease) stage 3, GFR 30-59 ml/min (HCC)    Depression    Diabetes mellitus (HCC)    Dizziness     Fatty liver    Frequent falls    GERD (gastroesophageal reflux disease)    Gout    Heart murmur    History of blood transfusion    History of bone marrow transplant ([place])    History of uterine fibroid    Hx of cardiac catheterization 06/05/2016   Overview:  Normal coronaries 2017   Hypertension    Hypomagnesemia    Multiple myeloma ([place])    Personal history of chemotherapy    Renal cyst    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[place]) 06/05/2016   Overview:  Sp ablation 1999    Past Surgical History:  Procedure Laterality Date   ABDOMINAL HYSTERECTOMY     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   CARDIAC ELECTROPHYSIOLOGY MAPPING AND ABLATION     CARPAL TUNNEL RELEASE Bilateral    CHOLECYSTECTOMY  2008   COLONOSCOPY WITH PROPOFOL N/A 05/07/2017   Procedure: COLONOSCOPY WITH PROPOFOL;  Surgeon: [name], MD;  Location: [facility] ENDOSCOPY;  Service: Gastroenterology;  Laterality: N/A;   ESOPHAGOGASTRODUODENOSCOPY (EGD) WITH PROPOFOL N/A 05/07/2017   Procedure: ESOPHAGOGASTRODUODENOSCOPY (EGD) WITH PROPOFOL;  Surgeon: [name], MD;  Location: [facility] ENDOSCOPY;  Service: Gastroenterology;  Laterality: N/A;   FOOT SURGERY Bilateral    INCONTINENCE SURGERY  2009   INTERSTIM IMPLANT PLACEMENT     other     over active bladder   OTHER SURGICAL HISTORY     bladder stimulator    PARTIAL HYSTERECTOMY  03/1996   fibroids   PORTA CATH INSERTION N/A 03/10/2019   Procedure: PORTA CATH INSERTION;  Surgeon: Algernon Huxley, MD;  Location: [place] CV LAB;  Service: Cardiovascular;  Laterality: N/A;   TONSILLECTOMY  2007    Family History  Problem Relation Age of Onset   Colon cancer Father    Renal Disease Father    Diabetes Mellitus II Father    Melanoma Paternal Grandmother    Breast cancer Maternal Aunt 16   Anemia Mother    Heart disease Mother    Heart failure Mother    Renal Disease Mother    Congestive Heart Failure Mother    Heart disease Maternal Uncle    Throat cancer Maternal Uncle    Lung  cancer Maternal Uncle    Liver disease Maternal Uncle    Heart failure Maternal Uncle    Hearing loss Son 98       Suicide     Social History:  reports that she quit smoking about 29 years ago. Her smoking use included cigarettes. She has a 20.00 pack-year smoking history. She has never used smokeless tobacco. She reports current alcohol use. She reports that she does not use drugs.  She is on disability. She notes exposure to perchloroethylene [facility]).  She lives much of her adult life in [place].   Her husband passed away.    Allergies:  Allergies  Allergen Reactions   Oxycodone-Acetaminophen Anaphylaxis    Swelling and rash   Celebrex [Celecoxib] Diarrhea   Codeine  Plerixafor     In 2016 during ASCT collection patient developed fever to 103.38F and required hospitalization   Benadryl [Diphenhydramine] Palpitations   Morphine Itching and Rash   Ondansetron Diarrhea   Tylenol [Acetaminophen] Itching and Rash    Current Medications: Current Outpatient Medications  Medication Sig Dispense Refill   acetaminophen (TYLENOL) 500 MG tablet Take 500 mg by mouth every 6 (six) hours as needed.     acyclovir (ZOVIRAX) 400 MG tablet Take 400 mg by mouth 2 (two) times daily.     allopurinol (ZYLOPRIM) 100 MG tablet TAKE 1 TABLET(100 MG) BY MOUTH DAILY as needed 90 tablet 1   amoxicillin-clavulanate (AUGMENTIN) 250-125 MG tablet Take 1 tablet by mouth 3 (three) times daily.     bisoprolol (ZEBETA) 5 MG tablet Take 5 mg by mouth daily.     diclofenac sodium (VOLTAREN) 1 % GEL Apply 2 g topically 4 (four) times daily. 100 g 1   diphenhydrAMINE (BENADRYL) 50 MG capsule Take 50 mg by mouth as needed.     diphenoxylate-atropine (LOMOTIL) 2.5-0.025 MG tablet Take 2 tablets by mouth 4 (four) times daily as needed for diarrhea or loose stools. 90 tablet 0   fexofenadine (ALLEGRA) 180 MG tablet TAKE 1 TABLET(180 MG) BY MOUTH DAILY 30 tablet 5   FLUoxetine (PROZAC) 40 MG capsule TAKE 1  CAPSULE EVERY DAY 90 capsule 1   fluticasone (FLONASE) 50 MCG/ACT nasal spray Place 2 sprays into both nostrils daily. 16 g 2   glucose blood test strip      lactulose (CHRONULAC) 10 GM/15ML solution Take 20 g by mouth daily as needed.     lidocaine (LIDODERM) 5 % 1 patch daily.     loperamide (IMODIUM) 2 MG capsule Take 1 capsule (2 mg total) by mouth 4 (four) times daily as needed for diarrhea or loose stools. 120 capsule 3   magnesium chloride (SLOW-MAG) 64 MG TBEC SR tablet Take 1 tablet (64 mg total) by mouth 2 (two) times daily. 60 tablet 1   methocarbamol (ROBAXIN) 500 MG tablet TAKE 1 TABLET FOUR TIMES DAILY. 120 tablet 0   Multiple Vitamin (MULTIVITAMIN) tablet Take 1 tablet by mouth daily.     Multiple Vitamins-Minerals (HAIR/SKIN/NAILS) TABS Take 1 tablet by mouth daily.     omeprazole (PRILOSEC) 40 MG capsule Take 1 capsule (40 mg total) by mouth in the morning and at bedtime. 180 capsule 1   promethazine (PHENERGAN) 25 MG tablet Take 1 tablet (25 mg total) by mouth every 6 (six) hours as needed for nausea or vomiting. 90 tablet 0   promethazine-dextromethorphan (PROMETHAZINE-DM) 6.25-15 MG/5ML syrup Take 5 mLs by mouth 4 (four) times daily as needed for cough. 118 mL 0   traZODone (DESYREL) 100 MG tablet Take 1 tablet (100 mg total) by mouth at bedtime. 90 tablet 1   No current facility-administered medications for this visit.   Facility-Administered Medications Ordered in Other Visits  Medication Dose Route Frequency Provider Last Rate Last Admin   0.9 %  sodium chloride infusion   Intravenous Continuous Lequita Asal, MD   Stopped at 01/20/20 1232   0.9 %  sodium chloride infusion   Intravenous Continuous Mike Gip, Melissa C, MD 10 mL/hr at 10/19/20 0900 New Bag at 10/19/20 0900   0.9 %  sodium chloride infusion   Intravenous Continuous Earlie Server, MD 10 mL/hr at 11/30/20 0957 New Bag at 11/30/20 0957   0.9 %  sodium chloride infusion   Intravenous Once Verlon Au,  NP        heparin lock flush 100 unit/mL  500 Units Intravenous Once Faythe Casa E, NP       heparin lock flush 100 unit/mL  500 Units Intravenous Once Mike Gip, Melissa C, MD       heparin lock flush 100 unit/mL  500 Units Intravenous Once Verlon Au, NP       magnesium sulfate 6 g in sodium chloride 0.9 % 500 mL  6 g Intravenous Once Verlon Au, NP       sodium chloride flush (NS) 0.9 % injection 10 mL  10 mL Intravenous PRN Nolon Stalls C, MD   10 mL at 02/07/20 0815   sodium chloride flush (NS) 0.9 % injection 10 mL  10 mL Intracatheter PRN Cammie Sickle, MD   10 mL at 02/10/20 0815   sodium chloride flush (NS) 0.9 % injection 10 mL  10 mL Intravenous PRN Jacquelin Hawking, NP   10 mL at 03/01/20 1143   sodium chloride flush (NS) 0.9 % injection 10 mL  10 mL Intravenous PRN Verlon Au, NP   10 mL at 12/07/20 7262    Review of Systems  Constitutional:  Negative for chills, fever, malaise/fatigue and weight loss.  HENT:  Negative for hearing loss, nosebleeds, sore throat and tinnitus.   Eyes:  Negative for blurred vision and double vision.  Respiratory:  Negative for cough, hemoptysis, shortness of breath and wheezing.   Cardiovascular:  Negative for chest pain, palpitations and leg swelling.  Gastrointestinal:  Positive for diarrhea and heartburn. Negative for abdominal pain, blood in stool, constipation, melena, nausea and vomiting.  Genitourinary:  Negative for dysuria and urgency.  Musculoskeletal:  Negative for back pain, falls, joint pain and myalgias.  Skin:  Negative for itching and rash.  Neurological:  Positive for tingling. Negative for dizziness, sensory change, loss of consciousness, weakness and headaches.  Endo/Heme/Allergies:  Negative for environmental allergies. Does not bruise/bleed easily.  Psychiatric/Behavioral:  Negative for depression. The patient is not nervous/anxious and does not have insomnia.    Performance status (ECOG):  1  Vitals Blood pressure 118/65, pulse 66, temperature 98.4 F (36.9 C), temperature source Tympanic, resp. rate 18, weight 151 lb 14.4 oz (68.9 kg), SpO2 99 %.  Physical Exam Nursing note reviewed.  Constitutional:      Appearance: She is not ill-appearing.  HENT:     Head: Normocephalic.  Eyes:     General: No scleral icterus.    Conjunctiva/sclera: Conjunctivae normal.  Cardiovascular:     Rate and Rhythm: Normal rate and regular rhythm.     Pulses: Normal pulses.  Pulmonary:     Effort: Pulmonary effort is normal. No respiratory distress.     Breath sounds: Normal breath sounds.  Abdominal:     General: There is no distension.     Tenderness: There is no abdominal tenderness. There is no guarding.  Musculoskeletal:        General: No swelling or deformity.     Cervical back: Neck supple.  Lymphadenopathy:     Cervical: No cervical adenopathy.  Skin:    General: Skin is warm and dry.     Coloration: Skin is not pale.     Findings: No rash.  Neurological:     Mental Status: She is alert and oriented to person, place, and time.  Psychiatric:        Behavior: Behavior normal.        Thought  Content: Thought content normal.        Judgment: Judgment normal.   Infusion on 12/07/2020  Component Date Value Ref Range Status   Magnesium 12/07/2020 1.1 (A) 1.7 - 2.4 mg/dL Final   Performed at [facility], [place], [place] [address]   Sodium 12/07/2020 138  135 - 145 mmol/L Final   Potassium 12/07/2020 4.1  3.5 - 5.1 mmol/L Final   Chloride 12/07/2020 106  98 - 111 mmol/L Final   CO2 12/07/2020 20 (A) 22 - 32 mmol/L Final   Glucose, Bld 12/07/2020 138 (A) 70 - 99 mg/dL Final   Glucose reference range applies only to samples taken after fasting for at least 8 hours.   BUN 12/07/2020 23  8 - 23 mg/dL Final   Creatinine, Ser 12/07/2020 1.09 (A) 0.44 - 1.00 mg/dL Final   Calcium 12/07/2020 8.6 (A) 8.9 - 10.3 mg/dL Final   Total Protein 12/07/2020 6.7   6.5 - 8.1 g/dL Final   Albumin 12/07/2020 4.1  3.5 - 5.0 g/dL Final   AST 12/07/2020 31  15 - 41 U/L Final   ALT 12/07/2020 28  0 - 44 U/L Final   Alkaline Phosphatase 12/07/2020 92  38 - 126 U/L Final   Total Bilirubin 12/07/2020 0.3  0.3 - 1.2 mg/dL Final   GFR, Estimated 12/07/2020 57 (A) >60 mL/min Final   Comment: (NOTE) Calculated using the CKD-EPI Creatinine Equation (2021)    Anion gap 12/07/2020 12  5 - 15 Final   Performed at [facility], [place], [place] [address]   WBC 12/07/2020 4.7  4.0 - 10.5 K/uL Final   RBC 12/07/2020 4.20  3.87 - 5.11 MIL/uL Final   Hemoglobin 12/07/2020 11.3 (A) 12.0 - 15.0 g/dL Final   HCT 12/07/2020 34.3 (A) 36.0 - 46.0 % Final   MCV 12/07/2020 81.7  80.0 - 100.0 fL Final   MCH 12/07/2020 26.9  26.0 - 34.0 pg Final   MCHC 12/07/2020 32.9  30.0 - 36.0 g/dL Final   RDW 12/07/2020 16.7 (A) 11.5 - 15.5 % Final   Platelets 12/07/2020 129 (A) 150 - 400 K/uL Final   nRBC 12/07/2020 0.0  0.0 - 0.2 % Final   Neutrophils Relative % 12/07/2020 71  % Final   Neutro Abs 12/07/2020 3.3  1.7 - 7.7 K/uL Final   Lymphocytes Relative 12/07/2020 18  % Final   Lymphs Abs 12/07/2020 0.9  0.7 - 4.0 K/uL Final   Monocytes Relative 12/07/2020 8  % Final   Monocytes Absolute 12/07/2020 0.4  0.1 - 1.0 K/uL Final   Eosinophils Relative 12/07/2020 3  % Final   Eosinophils Absolute 12/07/2020 0.1  0.0 - 0.5 K/uL Final   Basophils Relative 12/07/2020 0  % Final   Basophils Absolute 12/07/2020 0.0  0.0 - 0.1 K/uL Final   Immature Granulocytes 12/07/2020 0  % Final   Abs Immature Granulocytes 12/07/2020 0.02  0.00 - 0.07 K/uL Final   Performed at [facility], [address] [place], [place] [address]  Office Visit on 12/05/2020  Component Date Value Ref Range Status   Cholesterol, Total 12/05/2020 177  100 - 199 mg/dL Final   Triglycerides 12/05/2020 140  0 - 149 mg/dL Final   HDL 12/05/2020 59  >39 mg/dL Final   VLDL Cholesterol Cal  12/05/2020 24  5 - 40 mg/dL Final   LDL Chol Calc (NIH) 12/05/2020 94  0 - 99 mg/dL Final  Chol/HDL Ratio 12/05/2020 3.0  0.0 - 4.4 ratio Final   Comment:                                   T. Chol/HDL Ratio                                             Men  Women                               1/2 Avg.Risk  3.4    3.3                                   Avg.Risk  5.0    4.4                                2X Avg.Risk  9.6    7.1                                3X Avg.Risk 23.4   11.0     Assessment/Plan 1. Encounter for antineoplastic chemotherapy   2. Multiple myeloma not having achieved remission ([place])   3. Hypomagnesemia    Recurrent lambda light chain multiple myeloma - s/p second autologous bone marrow transplant 05/10/20. On maintenance velcade every 2 weeks. Tolerating well overall. Labs reviewed today and acceptable for continuation of treatment. Proceed with cycle 8 of velcade today. 11/16/2020, kappa/light chain ratio is normal at 0.84.Continue prophylactic antiviral through November 22 (1 year post transplant). Vaccinations at [facility] (post transplant Rn coordinator [phone]) IDA- hemg improved to 11.3. Ferritin previously 107 10/26/20. Monitor  3. CIPN- grade 2. Can consider subq administration if neuropathy worsens. Patient prefers I administration as planned due to requirements from insurance.  Diarrhea- Chronic- managed by gi; related to artificial sweeteners +/- velcade.  Hypomagnesemia- chronic; related to chronic diarrhea. Magnesium 1.1 today. Proceed with 6g Magnesium today.  Intolerant to oral magnesium. Nausea- Chronic.  Patient does not complain of this today B12 deficiency- chronic. Vitamin b12 monthly. Due at next cycle  Plan:  Velcade today. IV Magnesium 6 g today.  Lab in 1 week (cbc, cmp, mg) 2 weeks (lab - CBC with diff, CMP, Mg, SPEP, light chain ratio), MD, Cycle 9 velcade and magnesium   Patient prefers to move appts to [facility]   I discussed the  assessment and treatment plan with the patient.  The patient was provided an opportunity to ask questions and all were answered.  The patient agreed with the plan and demonstrated an understanding of the instructions.  The patient was advised to call back if the symptoms worsen or if the condition fails to improve as anticipated.  Beckey Rutter, DNP, AGNP-C [place] at [facility] [phone] (clinic)

## 2020-12-07 NOTE — Telephone Encounter (Signed)
Prep was sent via [place].

## 2020-12-07 NOTE — Addendum Note (Signed)
Addended by: Verlon Au on: 12/07/2020 09:43 AM   Modules accepted: Orders

## 2020-12-08 DIAGNOSIS — G629 Polyneuropathy, unspecified: Secondary | ICD-10-CM | POA: Diagnosis not present

## 2020-12-08 DIAGNOSIS — Z886 Allergy status to analgesic agent status: Secondary | ICD-10-CM | POA: Diagnosis not present

## 2020-12-08 DIAGNOSIS — K529 Noninfective gastroenteritis and colitis, unspecified: Secondary | ICD-10-CM | POA: Diagnosis not present

## 2020-12-08 DIAGNOSIS — Z9484 Stem cells transplant status: Secondary | ICD-10-CM | POA: Diagnosis not present

## 2020-12-08 DIAGNOSIS — N179 Acute kidney failure, unspecified: Secondary | ICD-10-CM | POA: Diagnosis not present

## 2020-12-08 DIAGNOSIS — D801 Nonfamilial hypogammaglobulinemia: Secondary | ICD-10-CM | POA: Diagnosis not present

## 2020-12-08 DIAGNOSIS — Z87891 Personal history of nicotine dependence: Secondary | ICD-10-CM | POA: Diagnosis not present

## 2020-12-08 DIAGNOSIS — C9002 Multiple myeloma in relapse: Secondary | ICD-10-CM | POA: Diagnosis not present

## 2020-12-08 DIAGNOSIS — C9000 Multiple myeloma not having achieved remission: Secondary | ICD-10-CM | POA: Diagnosis not present

## 2020-12-09 IMAGING — CR DG CHEST 2V
1 series · 2 of 2 positions shown · non-contrast
Comparison: 11/15/2019, CT 05/21/2016

CLINICAL DATA: Cough and congestion

EXAM:
CHEST - 2 VIEW

[Series 1: dg chest 2 view · 0.14mm/px · 2 of 2 slices shown]
[im 1/2]
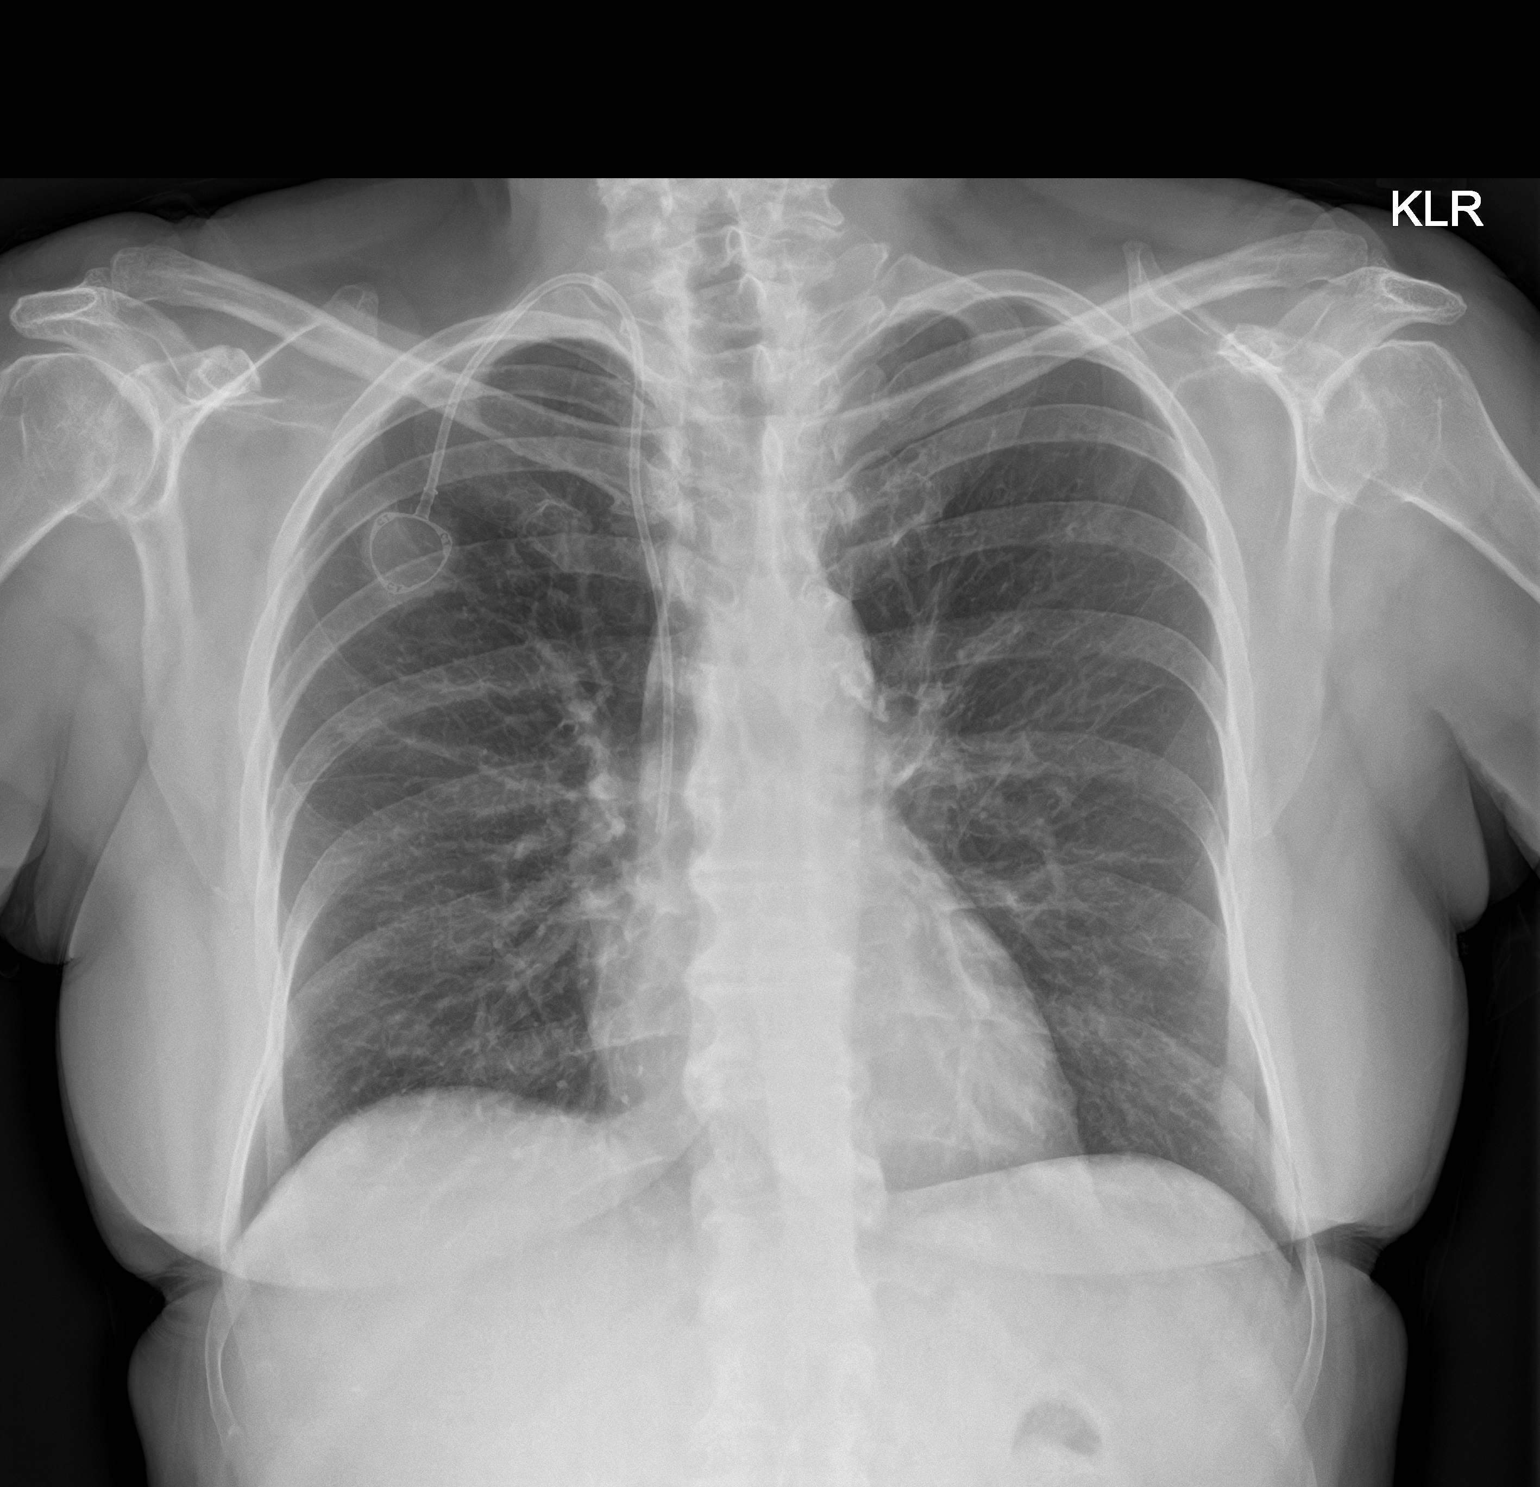
[im 2/2]
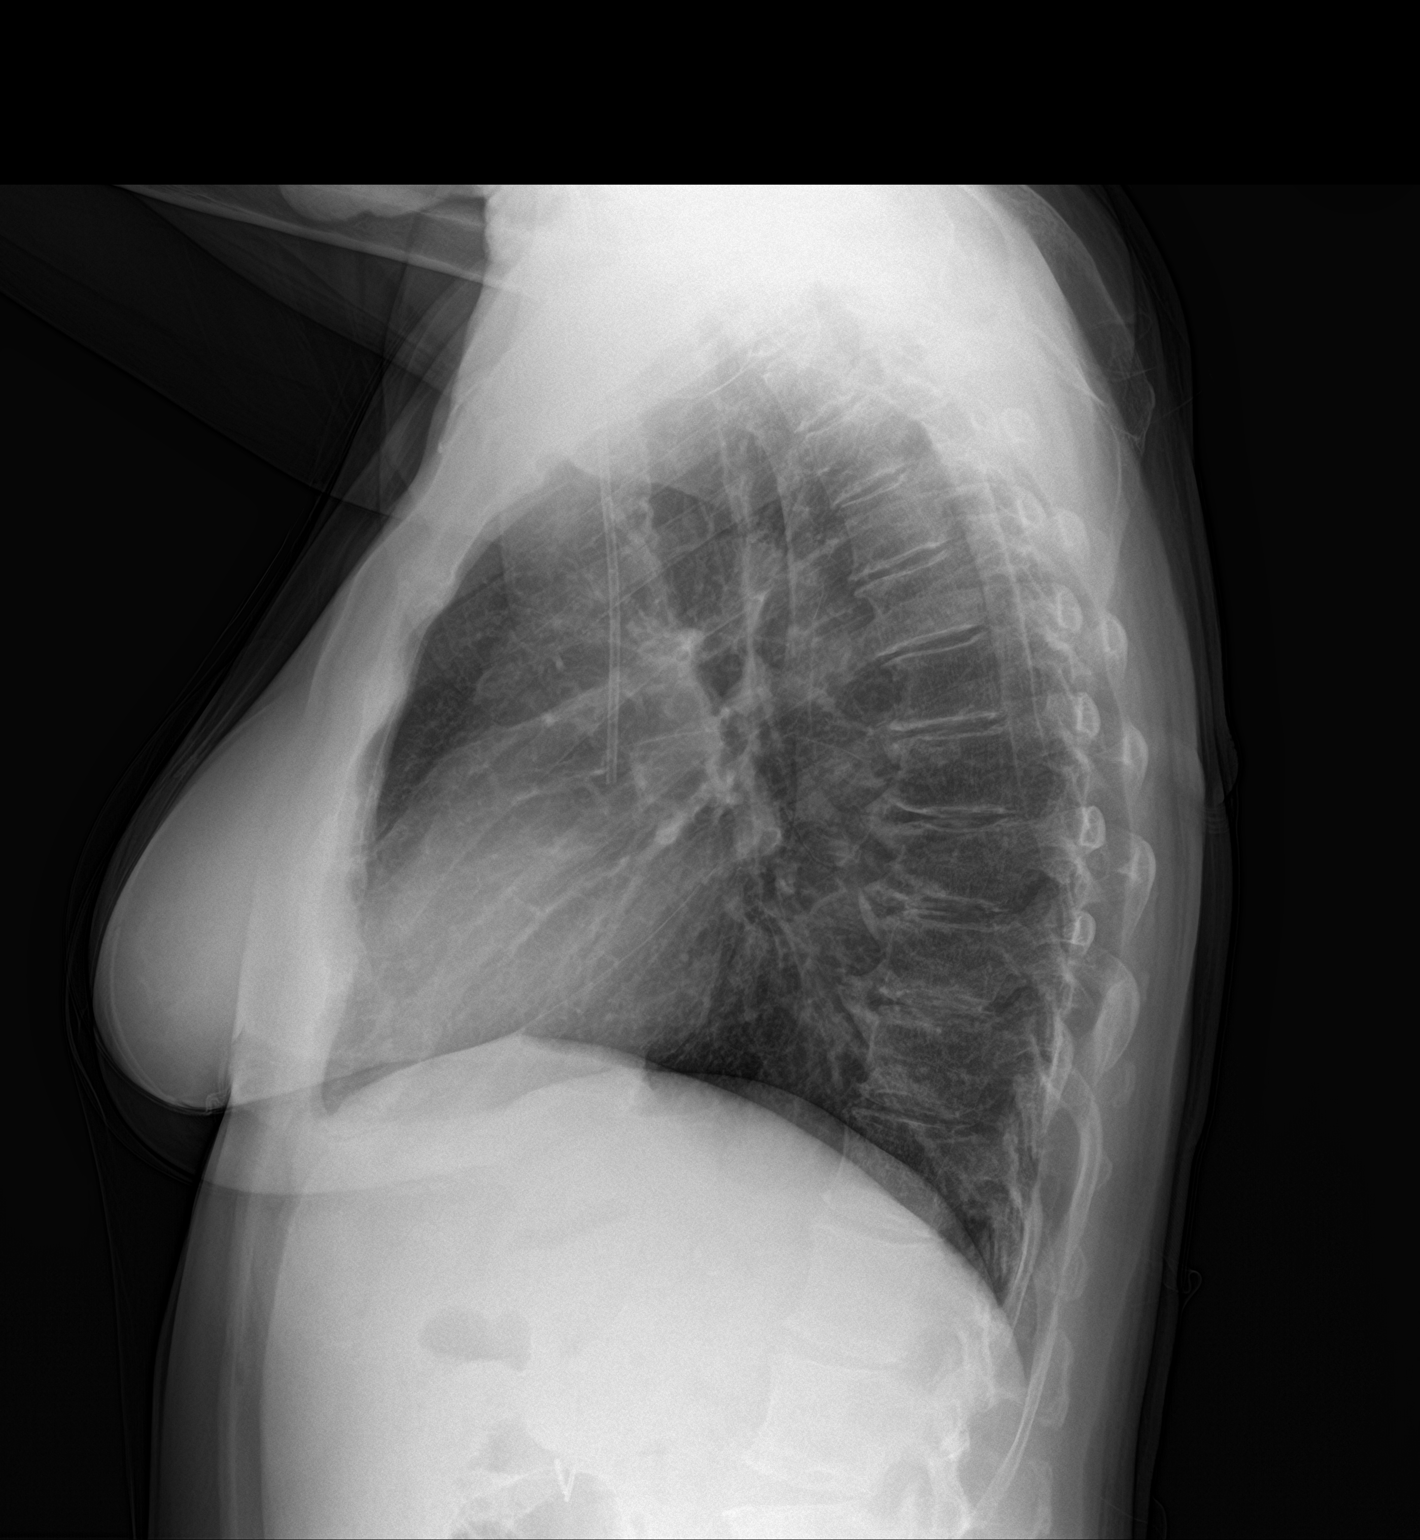

[2 of 2 positions shown; findings below may reference images not displayed]

FINDINGS: Right-sided central venous port tip over the SVC. Chronic bronchitic
changes. No focal opacity or pleural effusion. Normal
cardiomediastinal silhouette. No pneumothorax.
IMPRESSION: No active cardiopulmonary disease.

## 2020-12-14 ENCOUNTER — Inpatient Hospital Stay: Payer: Medicare HMO

## 2020-12-14 ENCOUNTER — Ambulatory Visit: Payer: Medicare HMO

## 2020-12-14 VITALS — BP 119/65 | HR 66 | Temp 97.5°F | Resp 18

## 2020-12-14 DIAGNOSIS — Z5112 Encounter for antineoplastic immunotherapy: Secondary | ICD-10-CM | POA: Diagnosis not present

## 2020-12-14 DIAGNOSIS — D709 Neutropenia, unspecified: Secondary | ICD-10-CM | POA: Diagnosis not present

## 2020-12-14 DIAGNOSIS — C9000 Multiple myeloma not having achieved remission: Secondary | ICD-10-CM

## 2020-12-14 DIAGNOSIS — K521 Toxic gastroenteritis and colitis: Secondary | ICD-10-CM | POA: Diagnosis not present

## 2020-12-14 DIAGNOSIS — K529 Noninfective gastroenteritis and colitis, unspecified: Secondary | ICD-10-CM | POA: Diagnosis not present

## 2020-12-14 DIAGNOSIS — E538 Deficiency of other specified B group vitamins: Secondary | ICD-10-CM | POA: Diagnosis not present

## 2020-12-14 DIAGNOSIS — D801 Nonfamilial hypogammaglobulinemia: Secondary | ICD-10-CM | POA: Diagnosis not present

## 2020-12-14 DIAGNOSIS — I350 Nonrheumatic aortic (valve) stenosis: Secondary | ICD-10-CM | POA: Diagnosis not present

## 2020-12-14 LAB — MAGNESIUM: Magnesium: 1.3 mg/dL — ABNORMAL LOW (ref 1.7–2.4)

## 2020-12-14 LAB — CBC WITH DIFFERENTIAL/PLATELET
Abs Immature Granulocytes: 0.02 10*3/uL (ref 0.00–0.07)
Basophils Absolute: 0 10*3/uL (ref 0.0–0.1)
Basophils Relative: 0 %
Eosinophils Absolute: 0.1 10*3/uL (ref 0.0–0.5)
Eosinophils Relative: 2 %
HCT: 35 % — ABNORMAL LOW (ref 36.0–46.0)
Hemoglobin: 11.4 g/dL — ABNORMAL LOW (ref 12.0–15.0)
Immature Granulocytes: 0 %
Lymphocytes Relative: 21 %
Lymphs Abs: 1.1 10*3/uL (ref 0.7–4.0)
MCH: 27 pg (ref 26.0–34.0)
MCHC: 32.6 g/dL (ref 30.0–36.0)
MCV: 82.9 fL (ref 80.0–100.0)
Monocytes Absolute: 0.4 10*3/uL (ref 0.1–1.0)
Monocytes Relative: 8 %
Neutro Abs: 3.6 10*3/uL (ref 1.7–7.7)
Neutrophils Relative %: 69 %
Platelets: 120 10*3/uL — ABNORMAL LOW (ref 150–400)
RBC: 4.22 MIL/uL (ref 3.87–5.11)
RDW: 16.7 % — ABNORMAL HIGH (ref 11.5–15.5)
WBC: 5.3 10*3/uL (ref 4.0–10.5)
nRBC: 0 % (ref 0.0–0.2)

## 2020-12-14 LAB — COMPREHENSIVE METABOLIC PANEL
ALT: 28 U/L (ref 0–44)
AST: 32 U/L (ref 15–41)
Albumin: 4.3 g/dL (ref 3.5–5.0)
Alkaline Phosphatase: 81 U/L (ref 38–126)
Anion gap: 9 (ref 5–15)
BUN: 26 mg/dL — ABNORMAL HIGH (ref 8–23)
CO2: 24 mmol/L (ref 22–32)
Calcium: 9 mg/dL (ref 8.9–10.3)
Chloride: 105 mmol/L (ref 98–111)
Creatinine, Ser: 1.05 mg/dL — ABNORMAL HIGH (ref 0.44–1.00)
GFR, Estimated: 59 mL/min — ABNORMAL LOW (ref >60)
Glucose, Bld: 128 mg/dL — ABNORMAL HIGH (ref 70–99)
Potassium: 4.3 mmol/L (ref 3.5–5.1)
Sodium: 138 mmol/L (ref 135–145)
Total Bilirubin: 0.2 mg/dL — ABNORMAL LOW (ref 0.3–1.2)
Total Protein: 6.7 g/dL (ref 6.5–8.1)

## 2020-12-14 MED ORDER — MAGNESIUM SULFATE 4 GM/100ML IV SOLN
4.0000 g | Freq: Once | INTRAVENOUS | Status: AC
  Administered 2020-12-14: 4 g via INTRAVENOUS
  Filled 2020-12-14: qty 100

## 2020-12-14 MED ORDER — HEPARIN SOD (PORK) LOCK FLUSH 100 UNIT/ML IV SOLN
INTRAVENOUS | Status: AC
  Filled 2020-12-14: qty 5

## 2020-12-14 MED ORDER — SODIUM CHLORIDE 0.9 % IV SOLN: 6.0000 g | Freq: Once | INTRAVENOUS | Status: DC

## 2020-12-14 MED ORDER — MAGNESIUM SULFATE 2 GM/50ML IV SOLN
2.0000 g | Freq: Once | INTRAVENOUS | Status: AC
Start: 2020-12-14 — End: 2020-12-14
  Administered 2020-12-14: 2 g via INTRAVENOUS
  Filled 2020-12-14: qty 50

## 2020-12-14 MED ORDER — HEPARIN SOD (PORK) LOCK FLUSH 100 UNIT/ML IV SOLN
500.0000 [IU] | Freq: Once | INTRAVENOUS | Status: AC | PRN
  Administered 2020-12-14: 500 [IU]
  Filled 2020-12-14: qty 5

## 2020-12-14 MED ORDER — SODIUM CHLORIDE 0.9 % IV SOLN
Freq: Once | INTRAVENOUS | Status: AC
  Filled 2020-12-14: qty 250

## 2020-12-14 NOTE — Patient Instructions (Signed)
Hypomagnesemia Hypomagnesemia is a condition in which the level of magnesium in the blood is low. Magnesium is a mineral that is found in many foods. It is used in many different processes in the body. Hypomagnesemia can affect every organ in thebody. In severe cases, it can cause life-threatening problems. What are the causes? This condition may be caused by: Not getting enough magnesium in your diet. Malnutrition. Problems with absorbing magnesium from the intestines. Dehydration. Alcohol abuse. Vomiting. Severe or chronic diarrhea. Some medicines, including medicines that make you urinate more (diuretics). Certain diseases, such as kidney disease, diabetes, celiac disease, and overactive thyroid. What are the signs or symptoms? Symptoms of this condition include: Loss of appetite. Nausea and vomiting. Involuntary shaking or trembling of a body part (tremor). Muscle weakness. Tingling in the arms and legs. Sudden tightening of muscles (muscle spasms). Confusion. Psychiatric issues, such as depression, irritability, or psychosis. A feeling of fluttering of the heart. Seizures. These symptoms are more severe if magnesium levels drop suddenly. How is this diagnosed? This condition may be diagnosed based on: Your symptoms and medical history. A physical exam. Blood and urine tests. How is this treated? Treatment depends on the cause and the severity of the condition. It may be treated with: A magnesium supplement. This can be taken in pill form. If the condition is severe, magnesium is usually given through an IV. Changes to your diet. You may be directed to eat foods that have a lot of magnesium, such as green leafy vegetables, peas, beans, and nuts. Stopping any intake of alcohol. Follow these instructions at home:     Make sure that your diet includes foods with magnesium. Foods that have a lot of magnesium in them include: Green leafy vegetables, such as spinach and  broccoli. Beans and peas. Nuts and seeds, such as almonds and sunflower seeds. Whole grains, such as whole grain bread and fortified cereals. Take magnesium supplements if your health care provider tells you to do that. Take them as directed. Take over-the-counter and prescription medicines only as told by your health care provider. Have your magnesium levels monitored as told by your health care provider. When you are active, drink fluids that contain electrolytes. Avoid drinking alcohol. Keep all follow-up visits as told by your health care provider. This is important. Contact a health care provider if: You get worse instead of better. Your symptoms return. Get help right away if you: Develop severe muscle weakness. Have trouble breathing. Feel that your heart is racing. Summary Hypomagnesemia is a condition in which the level of magnesium in the blood is low. Hypomagnesemia can affect every organ in the body. Treatment may include eating more foods that contain magnesium, taking magnesium supplements, and not drinking alcohol. Have your magnesium levels monitored as told by your health care provider. This information is not intended to replace advice given to you by your health care provider. Make sure you discuss any questions you have with your healthcare provider. Document Revised: 11/18/2019 Document Reviewed: 11/18/2019 Elsevier Patient Education  2022 Elsevier Inc.  

## 2020-12-15 LAB — KAPPA/LAMBDA LIGHT CHAINS
Kappa free light chain: 8.9 mg/L (ref 3.3–19.4)
Kappa, lambda light chain ratio: 1.09 (ref 0.26–1.65)
Lambda free light chains: 8.2 mg/L (ref 5.7–26.3)

## 2020-12-18 LAB — MULTIPLE MYELOMA PANEL, SERUM
Albumin SerPl Elph-Mcnc: 3.9 g/dL (ref 2.9–4.4)
Albumin/Glob SerPl: 1.8 — ABNORMAL HIGH (ref 0.7–1.7)
Alpha 1: 0.2 g/dL (ref 0.0–0.4)
Alpha2 Glob SerPl Elph-Mcnc: 0.9 g/dL (ref 0.4–1.0)
B-Globulin SerPl Elph-Mcnc: 0.8 g/dL (ref 0.7–1.3)
Gamma Glob SerPl Elph-Mcnc: 0.2 g/dL — ABNORMAL LOW (ref 0.4–1.8)
Globulin, Total: 2.2 g/dL (ref 2.2–3.9)
IgA: 22 mg/dL — ABNORMAL LOW (ref 87–352)
IgG (Immunoglobin G), Serum: 275 mg/dL — ABNORMAL LOW (ref 586–1602)
IgM (Immunoglobulin M), Srm: 25 mg/dL — ABNORMAL LOW (ref 26–217)
Total Protein ELP: 6.1 g/dL (ref 6.0–8.5)

## 2020-12-19 ENCOUNTER — Encounter: Payer: Self-pay | Admitting: Gastroenterology

## 2020-12-19 ENCOUNTER — Ambulatory Visit
Admission: RE | Admit: 2020-12-19 | Discharge: 2020-12-19 | Disposition: A | Discharge status: Home / Self Care | Payer: Medicare HMO | Attending: Gastroenterology | Admitting: Gastroenterology

## 2020-12-19 ENCOUNTER — Other Ambulatory Visit: Payer: Self-pay

## 2020-12-19 ENCOUNTER — Ambulatory Visit: Payer: Medicare HMO | Admitting: Pediatrics

## 2020-12-19 ENCOUNTER — Encounter
Admission: RE | Disposition: A | Discharge status: Home / Self Care | Payer: Self-pay | Source: Home / Self Care | Attending: Gastroenterology

## 2020-12-19 DIAGNOSIS — Z90710 Acquired absence of both cervix and uterus: Secondary | ICD-10-CM | POA: Diagnosis not present

## 2020-12-19 DIAGNOSIS — Z9481 Bone marrow transplant status: Secondary | ICD-10-CM | POA: Diagnosis not present

## 2020-12-19 DIAGNOSIS — Z87891 Personal history of nicotine dependence: Secondary | ICD-10-CM | POA: Insufficient documentation

## 2020-12-19 DIAGNOSIS — R197 Diarrhea, unspecified: Secondary | ICD-10-CM | POA: Diagnosis not present

## 2020-12-19 DIAGNOSIS — Z888 Allergy status to other drugs, medicaments and biological substances status: Secondary | ICD-10-CM | POA: Insufficient documentation

## 2020-12-19 DIAGNOSIS — Z833 Family history of diabetes mellitus: Secondary | ICD-10-CM | POA: Diagnosis not present

## 2020-12-19 DIAGNOSIS — Z90711 Acquired absence of uterus with remaining cervical stump: Secondary | ICD-10-CM | POA: Diagnosis not present

## 2020-12-19 DIAGNOSIS — Z801 Family history of malignant neoplasm of trachea, bronchus and lung: Secondary | ICD-10-CM | POA: Diagnosis not present

## 2020-12-19 DIAGNOSIS — C9000 Multiple myeloma not having achieved remission: Secondary | ICD-10-CM | POA: Diagnosis not present

## 2020-12-19 DIAGNOSIS — Z79899 Other long term (current) drug therapy: Secondary | ICD-10-CM | POA: Diagnosis not present

## 2020-12-19 DIAGNOSIS — Z885 Allergy status to narcotic agent status: Secondary | ICD-10-CM | POA: Diagnosis not present

## 2020-12-19 DIAGNOSIS — K635 Polyp of colon: Secondary | ICD-10-CM | POA: Diagnosis not present

## 2020-12-19 DIAGNOSIS — K529 Noninfective gastroenteritis and colitis, unspecified: Secondary | ICD-10-CM | POA: Insufficient documentation

## 2020-12-19 DIAGNOSIS — Z8249 Family history of ischemic heart disease and other diseases of the circulatory system: Secondary | ICD-10-CM | POA: Insufficient documentation

## 2020-12-19 DIAGNOSIS — Z886 Allergy status to analgesic agent status: Secondary | ICD-10-CM | POA: Insufficient documentation

## 2020-12-19 DIAGNOSIS — Z803 Family history of malignant neoplasm of breast: Secondary | ICD-10-CM | POA: Diagnosis not present

## 2020-12-19 DIAGNOSIS — D649 Anemia, unspecified: Secondary | ICD-10-CM | POA: Diagnosis not present

## 2020-12-19 DIAGNOSIS — D122 Benign neoplasm of ascending colon: Secondary | ICD-10-CM | POA: Diagnosis not present

## 2020-12-19 HISTORY — PX: COLONOSCOPY WITH PROPOFOL: SHX5780

## 2020-12-19 HISTORY — DX: Iron deficiency anemia, unspecified: D50.9

## 2020-12-19 LAB — GLUCOSE, CAPILLARY: Glucose-Capillary: 142 mg/dL — ABNORMAL HIGH (ref 70–99)

## 2020-12-19 SURGERY — COLONOSCOPY WITH PROPOFOL
Anesthesia: General

## 2020-12-19 MED ORDER — PROPOFOL 500 MG/50ML IV EMUL
INTRAVENOUS | Status: DC | PRN
  Administered 2020-12-19: 150 ug/kg/min via INTRAVENOUS

## 2020-12-19 MED ORDER — PROMETHAZINE HCL 25 MG/ML IJ SOLN
INTRAMUSCULAR | Status: AC
  Filled 2020-12-19: qty 1

## 2020-12-19 MED ORDER — LIDOCAINE HCL (CARDIAC) PF 100 MG/5ML IV SOSY
PREFILLED_SYRINGE | INTRAVENOUS | Status: DC | PRN
  Administered 2020-12-19: 40 mg via INTRAVENOUS

## 2020-12-19 MED ORDER — SODIUM CHLORIDE 0.9 % IV SOLN
12.5000 mg | Freq: Once | INTRAVENOUS | Status: AC
  Administered 2020-12-19: 12.5 mg via INTRAVENOUS
  Filled 2020-12-19: qty 0.5

## 2020-12-19 MED ORDER — PROPOFOL 500 MG/50ML IV EMUL
INTRAVENOUS | Status: AC
  Filled 2020-12-19: qty 50

## 2020-12-19 MED ORDER — PHENYLEPHRINE HCL (PRESSORS) 10 MG/ML IV SOLN
INTRAVENOUS | Status: DC | PRN
  Administered 2020-12-19 (×2): 200 ug via INTRAVENOUS

## 2020-12-19 MED ORDER — SODIUM CHLORIDE 0.9 % IV SOLN: INTRAVENOUS | Status: DC

## 2020-12-19 MED ORDER — HEPARIN SOD (PORK) LOCK FLUSH 100 UNIT/ML IV SOLN
INTRAVENOUS | Status: AC
  Filled 2020-12-19: qty 5

## 2020-12-19 MED ORDER — PROPOFOL 10 MG/ML IV BOLUS
INTRAVENOUS | Status: DC | PRN
  Administered 2020-12-19 (×2): 10 mg via INTRAVENOUS
  Administered 2020-12-19: 70 mg via INTRAVENOUS

## 2020-12-19 NOTE — Op Note (Signed)
[facility] Patient Name: Sarah [name] Procedure Date: 12/19/2020 7:54 AM MRN: [id number] Account #: [id number] Date of Birth: 1956-04-29 Admit Type: Outpatient Age: 65 Room: [facility] ENDO ROOM 2 Gender: Female Note Status: Finalized Procedure:             Colonoscopy Indications:           Chronic diarrhea Providers:             [name] MD, MD Referring MD:          Halina Maidens, MD (Referring MD) Medicines:             Propofol per Anesthesia, Monitored Anesthesia Care Complications:         No immediate complications. Procedure:             Pre-Anesthesia Assessment:                        - Prior to the procedure, a History and Physical was                         performed, and patient medications, allergies and                         sensitivities were reviewed. The patient's tolerance                         of previous anesthesia was reviewed.                        - The risks and benefits of the procedure and the                         sedation options and risks were discussed with the                         patient. All questions were answered and informed                         consent was obtained.                        - ASA Grade Assessment: II - A patient with mild                         systemic disease.                        After obtaining informed consent, the colonoscope was                         passed under direct vision. Throughout the procedure,                         the patient's blood pressure, pulse, and oxygen                         saturations were monitored continuously. The                         Colonoscope was introduced through the anus and  advanced to the the cecum, identified by the                         appendiceal orifice. The colonoscopy was performed                         with moderate difficulty due to significant looping.                         The patient  tolerated the procedure well. The quality                         of the bowel preparation was good. Findings:      The perianal and digital rectal examinations were normal.      Two sessile polyps were found in the ascending colon. The polyps were 6       to 8 mm in size. These polyps were removed with a cold snare. Resection       and retrieval were complete. These polyps were removed with a cold       snare. Resection and retrieval were complete.      Normal mucosa was found in the entire colon. Biopsies for histology were       taken with a cold forceps from the entire colon for evaluation of       microscopic colitis.      The exam was otherwise without abnormality. Impression:            - Two 6 to 8 mm polyps in the ascending colon, removed                         with a cold snare. Resected and retrieved.                        - Normal mucosa in the entire examined colon. Biopsied.                        - The examination was otherwise normal. Recommendation:        - Discharge patient to home (with escort).                        - Resume previous diet.                        - Continue present medications.                        - Await pathology results.                        - Repeat colonoscopy for surveillance based on                         pathology results. Procedure Code(s):     --- Professional ---                        [phone], Colonoscopy, flexible; with removal of                         tumor(s), polyp(s), or  other lesion(s) by snare                         technique                        [id number], 59, Colonoscopy, flexible; with biopsy, single                         or multiple Diagnosis Code(s):     --- Professional ---                        K63.5, Polyp of colon                        K52.9, Noninfective gastroenteritis and colitis,                         unspecified CPT copyright 2019 American Medical Association. All rights reserved. The codes  documented in this report are preliminary and upon coder review may  be revised to meet current compliance requirements. [name], MD [name] MD, MD 12/19/2020 8:52:17 AM This report has been signed electronically. Number of Addenda: 0 Note Initiated On: 12/19/2020 7:54 AM Scope Withdrawal Time: 0 hours 18 minutes 28 seconds  Total Procedure Duration: 0 hours 31 minutes 7 seconds  Estimated Blood Loss:  Estimated blood loss: none.      [facility]

## 2020-12-19 NOTE — Transfer of Care (Signed)
Immediate Anesthesia Transfer of Care Note  Patient: Sarah [name]  Procedure(s) Performed: Procedure(s): COLONOSCOPY WITH PROPOFOL (N/A)  Patient Location: PACU and Endoscopy Unit  Anesthesia Type:General  Level of Consciousness: sedated  Airway & Oxygen Therapy: Patient Spontanous Breathing and Patient connected to nasal cannula oxygen  Post-op Assessment: Report given to RN and Post -op Vital signs reviewed and stable  Post vital signs: Reviewed and stable  Last Vitals:  Vitals:   12/19/20 0810 12/19/20 0858  BP: 128/67 122/80  Pulse: 78 80  Resp: 14 (!) 23  Temp: (!) 35.8 C   SpO2: 74% 12%    Complications: No apparent anesthesia complications

## 2020-12-19 NOTE — Anesthesia Postprocedure Evaluation (Signed)
Anesthesia Post Note  Patient: Sarah [name]  Procedure(s) Performed: COLONOSCOPY WITH PROPOFOL  Patient location during evaluation: Endoscopy Anesthesia Type: General Level of consciousness: awake and alert Pain management: pain level controlled Vital Signs Assessment: post-procedure vital signs reviewed and stable Respiratory status: spontaneous breathing, nonlabored ventilation, respiratory function stable and patient connected to nasal cannula oxygen Cardiovascular status: blood pressure returned to baseline and stable Postop Assessment: no apparent nausea or vomiting Anesthetic complications: no   No notable events documented.   Last Vitals:  Vitals:   12/19/20 0918 12/19/20 0928  BP: 128/76 125/67  Pulse: 72 68  Resp: 20 19  Temp:    SpO2: 96% 94%    Last Pain:  Vitals:   12/19/20 0938  TempSrc:   PainSc: 0-No pain                 Larey Dresser

## 2020-12-19 NOTE — Anesthesia Procedure Notes (Signed)
Date/Time: 12/19/2020 8:15 AM Performed by: Doreen Salvage, CRNA Pre-anesthesia Checklist: Patient identified, Emergency Drugs available, Suction available and Patient being monitored Patient Re-evaluated:Patient Re-evaluated prior to induction Oxygen Delivery Method: Nasal cannula Induction Type: IV induction Dental Injury: Teeth and Oropharynx as per pre-operative assessment

## 2020-12-19 NOTE — Anesthesia Preprocedure Evaluation (Addendum)
Anesthesia Evaluation  Patient identified by MRN, date of birth, ID band Patient awake    Reviewed: Allergy & Precautions, H&P , NPO status , Patient's Chart, lab work & pertinent test results  History of Anesthesia Complications Negative for: history of anesthetic complications  Airway Mallampati: III  TM Distance: >3 FB Neck ROM: full    Dental  (+) Chipped, Poor Dentition, Missing, Edentulous Upper, Upper Dentures,    Pulmonary neg shortness of breath, asthma , former smoker,    Pulmonary exam normal breath sounds clear to auscultation       Cardiovascular Exercise Tolerance: Good hypertension, (-) angina+ CAD and +CHF (in past; nl EF)  (-) Past MI Normal cardiovascular exam+ dysrhythmias Supra Ventricular Tachycardia + Valvular Problems/Murmurs AS  Rhythm:Regular Rate:Normal   Echo 2021: IMPRESSIONS  1. Left ventricular ejection fraction, by estimation, is 60 to 65%. The left ventricle has normal function. The left ventricle has no regional wall motion abnormalities. There is mild left ventricular hypertrophy.  Left ventricular diastolic parameters were normal.  2. Right ventricular systolic function is normal. The right ventricular size is normal. There is mildly elevated pulmonary artery systolic pressure.  3. The mitral valve is normal in structure. Mild mitral valve  regurgitation.  4. The aortic valve is normal in structure. Aortic valve regurgitation is trivial. Mild to moderate aortic valve stenosis.    Neuro/Psych PSYCHIATRIC DISORDERS Anxiety Depression  Neuromuscular disease (chemo-induced neuropathy)    GI/Hepatic Neg liver ROS, GERD  Controlled, Chronic mild nausea w/ chemo, no emesis x 2 weeks   Endo/Other  diabetes, Type 2  Renal/GU Renal disease  negative genitourinary   Musculoskeletal  (+) Arthritis ,   Abdominal (+) - obese,   Peds  Hematology  (+) Blood dyscrasia, anemia ,  H/o multiple  myeloma s/p bone marrow transplants 2016, 2021, currently on chemo   Anesthesia Other Findings Past Medical History: No date: Anemia No date: Anxiety No date: Arthritis No date: Bicuspid aortic valve No date: CHF (congestive heart failure) (HCC) No date: CKD (chronic kidney disease) stage 3, GFR 30-59 ml/min (HCC) No date: Depression No date: Diabetes mellitus (HCC) No date: Dizziness No date: Fatty liver No date: Frequent falls No date: GERD (gastroesophageal reflux disease) No date: Gout No date: Heart murmur No date: History of blood transfusion No date: History of bone marrow transplant ([place]) No date: History of uterine fibroid No date: Hypertension No date: Hypomagnesemia No date: Multiple myeloma ([place]) No date: Renal cyst  Past Surgical History: 06/2015: Auto Stem Cell transplant No date: CARDIAC ELECTROPHYSIOLOGY MAPPING AND ABLATION No date: CARPAL TUNNEL RELEASE; Bilateral 2008: CHOLECYSTECTOMY No date: FOOT SURGERY; Bilateral 2009: INCONTINENCE SURGERY 03/1996: PARTIAL HYSTERECTOMY     Comment:  fibroids 2007: TONSILLECTOMY      Reproductive/Obstetrics negative OB ROS                           Anesthesia Physical  Anesthesia Plan  ASA: 3  Anesthesia Plan: General   Post-op Pain Management:    Induction: Intravenous  PONV Risk Score and Plan: 3 and Propofol infusion and TIVA  Airway Management Planned: Natural Airway and Nasal Cannula  Additional Equipment:   Intra-op Plan:   Post-operative Plan:   Informed Consent: I have reviewed the patients History and Physical, chart, labs and discussed the procedure including the risks, benefits and alternatives for the proposed anesthesia with the patient or authorized representative who has indicated his/her understanding and acceptance.  Dental Advisory Given  Plan Discussed with: Anesthesiologist and CRNA  Anesthesia Plan Comments: (Intolerance to Zofran per pt -  diarrhea. Phenergan works best for nausea. Denies worsened nausea or any emesis.)       Anesthesia Quick Evaluation

## 2020-12-19 NOTE — H&P (Signed)
[name], MD [address] [place], [place], [place], [address] [address], [place], [place], [place], [address] Phone: [phone]  Fax: [phone]  Primary Care Physician:  Glean Hess, MD   Pre-Procedure History & Physical: HPI:  Sarah [name] is a 65 y.o. female is here for an colonoscopy.   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   Due to Zometa   Cardiomyopathy, idiopathic ([place]) 08/21/2017   Overview:  Septal and apical hypokinesis with ef 40%   CHF (congestive heart failure) (HCC)    CKD (chronic kidney disease) stage 3, GFR 30-59 ml/min (HCC)    Depression    Diabetes mellitus (HCC)    Dizziness    Fatty liver    Frequent falls    GERD (gastroesophageal reflux disease)    Gout    Heart murmur    History of blood transfusion    History of bone marrow transplant ([place])    History of uterine fibroid    Hx of cardiac catheterization 06/05/2016   Overview:  Normal coronaries 2017   Hypertension    Hypomagnesemia    IDA (iron deficiency anemia)    Multiple myeloma ([place])    Personal history of chemotherapy    Renal cyst    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/05/2016   Overview:  Sp ablation 1999    Past Surgical History:  Procedure Laterality Date   ABDOMINAL HYSTERECTOMY     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   CARDIAC ELECTROPHYSIOLOGY MAPPING AND ABLATION     CARPAL TUNNEL RELEASE Bilateral    CHOLECYSTECTOMY  2008   COLONOSCOPY WITH PROPOFOL N/A 05/07/2017   Procedure: COLONOSCOPY WITH PROPOFOL;  Surgeon: [name], MD;  Location: [facility] ENDOSCOPY;  Service: Gastroenterology;  Laterality: N/A;   ESOPHAGOGASTRODUODENOSCOPY (EGD) WITH PROPOFOL N/A 05/07/2017   Procedure: ESOPHAGOGASTRODUODENOSCOPY (EGD) WITH  PROPOFOL;  Surgeon: [name], MD;  Location: [facility] ENDOSCOPY;  Service: Gastroenterology;  Laterality: N/A;   FOOT SURGERY Bilateral    INCONTINENCE SURGERY  2009   INTERSTIM IMPLANT PLACEMENT     other     over active bladder   OTHER SURGICAL HISTORY     bladder stimulator    PARTIAL HYSTERECTOMY  03/1996   fibroids   PORTA CATH INSERTION N/A 03/10/2019   Procedure: PORTA CATH INSERTION;  Surgeon: Algernon Huxley, MD;  Location: [facility];  Service: Cardiovascular;  Laterality: N/A;   TONSILLECTOMY  2007    Prior to Admission medications   Medication Sig Start Date End Date Taking? Authorizing Provider  acyclovir (ZOVIRAX) 400 MG tablet Take 400 mg by mouth 2 (two) times daily. 11/01/20  Yes [provider]  bisoprolol (ZEBETA) 5 MG tablet Take 5 mg by mouth daily. 07/05/20  Yes [provider]  diphenoxylate-atropine (LOMOTIL) 2.5-0.025 MG tablet Take 2 tablets by mouth 4 (four) times daily as needed for diarrhea or loose stools. 11/09/20  Yes Verlon Au, NP  FLUoxetine (PROZAC) 40 MG capsule TAKE 1 CAPSULE EVERY DAY 05/09/20  Yes Glean Hess, MD  promethazine (PHENERGAN) 25 MG tablet Take 1 tablet (25 mg total) by mouth every 6 (six) hours as needed for nausea or vomiting. 11/23/20  Yes Earlie Server, MD  traZODone (DESYREL) 100 MG tablet Take 1 tablet (100 mg total) by mouth at bedtime. 12/05/20  Yes Glean Hess, MD  acetaminophen (TYLENOL) 500 MG tablet Take 500 mg by mouth every 6 (six) hours as needed.    [provider]  allopurinol (ZYLOPRIM) 100 MG tablet TAKE 1 TABLET(100 MG) BY MOUTH DAILY as needed 12/02/18   Glean Hess, MD  amoxicillin-clavulanate (AUGMENTIN) 250-125 MG tablet Take 1 tablet by mouth 3 (three) times daily.    [provider]  diclofenac sodium (VOLTAREN) 1 % GEL Apply 2 g topically 4 (four) times daily. 01/25/19   Gertie Baron, NP  diphenhydrAMINE (BENADRYL) 50 MG capsule Take 50 mg by mouth as needed.     [provider]  fexofenadine (ALLEGRA) 180 MG tablet TAKE 1 TABLET(180 MG) BY MOUTH DAILY 05/30/17   Glean Hess, MD  fluticasone [facility]) 50 MCG/ACT nasal spray Place 2 sprays into both nostrils daily. 07/10/18   Glean Hess, MD  glucose blood test strip  09/04/15   [provider]  lactulose (CHRONULAC) 10 GM/15ML solution Take 20 g by mouth daily as needed. 10/09/19   [provider]  lidocaine (LIDODERM) 5 % 1 patch daily. 06/04/20   [provider]  loperamide (IMODIUM) 2 MG capsule Take 1 capsule (2 mg total) by mouth 4 (four) times daily as needed for diarrhea or loose stools. 11/03/20   Verlon Au, NP  magnesium chloride (SLOW-MAG) 64 MG TBEC SR tablet Take 1 tablet (64 mg total) by mouth 2 (two) times daily. 11/23/20   Earlie Server, MD  methocarbamol (ROBAXIN) 500 MG tablet TAKE 1 TABLET FOUR TIMES DAILY. 07/12/20   Glean Hess, MD  Multiple Vitamin (MULTIVITAMIN) tablet Take 1 tablet by mouth daily.    [provider]  Multiple Vitamins-Minerals (HAIR/SKIN/NAILS) TABS Take 1 tablet by mouth daily.    [provider]  omeprazole (PRILOSEC) 40 MG capsule Take 1 capsule (40 mg total) by mouth in the morning and at bedtime. 12/05/20   Glean Hess, MD  promethazine-dextromethorphan (PROMETHAZINE-DM) 6.25-15 MG/5ML syrup Take 5 mLs by mouth 4 (four) times daily as needed for cough. 11/09/20   Verlon Au, NP  montelukast (SINGULAIR) 10 MG tablet Take 1 tablet by mouth on the day before treatment and for 2 days after treatment. 12/16/19 08/16/20  Lequita Asal, MD    Allergies as of 12/06/2020 - Review Complete 12/05/2020  Allergen Reaction Noted   Oxycodone-acetaminophen Anaphylaxis 09/01/2011   Celebrex [celecoxib] Diarrhea 01/04/2019   Codeine  09/05/2017   Plerixafor  12/31/2019   Benadryl [diphenhydramine] Palpitations 04/02/2016   Morphine Itching and Rash 04/02/2016   Ondansetron Diarrhea 09/01/2011    Tylenol [acetaminophen] Itching and Rash 04/02/2016    Family History  Problem Relation Age of Onset   Colon cancer Father    Renal Disease Father    Diabetes Mellitus II Father    Melanoma Paternal Grandmother    Breast cancer Maternal Aunt 45   Anemia Mother    Heart disease Mother    Heart failure Mother    Renal Disease Mother    Congestive Heart Failure Mother    Heart disease Maternal Uncle    Throat cancer Maternal Uncle    Lung cancer Maternal Uncle    Liver disease Maternal Uncle    Heart failure Maternal Uncle    Hearing loss Son 64       Suicide     Social History  Socioeconomic History   Marital status: Widowed    Spouse name: Not on file   Number of children: Not on file   Years of education: Not on file   Highest education level: Not on file  Occupational History   Occupation: Disabled  Tobacco Use   Smoking status: Former    Packs/day: 1.00    Years: 20.00    Pack years: 20.00    Types: Cigarettes    Quit date: 07/02/1991    Years since quitting: 29.4   Smokeless tobacco: Never  Vaping Use   Vaping Use: Never used  Substance and Sexual Activity   Alcohol use: Yes    Comment: occasionally   Drug use: No   Sexual activity: Never  Other Topics Concern   Not on file  Social History Narrative   Pt lives with her 2 sisters   Social Determinants of Health   Financial Resource Strain: Medium Risk   Difficulty of Paying Living Expenses: Somewhat hard  Food Insecurity: No Food Insecurity   Worried About Charity fundraiser in the Last Year: Never true   [place] in the Last Year: Never true  Transportation Needs: No Transportation Needs   Lack of Transportation (Medical): No   Lack of Transportation (Non-Medical): No  Physical Activity: Inactive   Days of Exercise per Week: 0 days   Minutes of Exercise per Session: 0 min  Stress: Stress Concern Present   Feeling of Stress : Rather much  Social Connections: Unknown   Frequency of  Communication with Friends and Family: Not on file   Frequency of Social Gatherings with Friends and Family: Not on file   Attends Religious Services: Not on file   Active Member of Clubs or Organizations: Not on file   Attends Archivist Meetings: Not on file   Marital Status: Widowed  Human resources officer Violence: Not At Risk   Fear of Current or Ex-Partner: No   Emotionally Abused: No   Physically Abused: No   Sexually Abused: No    Review of Systems: See HPI, otherwise negative ROS  Physical Exam: BP 125/67   Pulse 68   Temp (!) 96.4 F (35.8 C) (Temporal)   Resp 19   Ht 5' 2"  (1.575 m)   Wt 69.9 kg   SpO2 94%   BMI 28.17 kg/m  General:   Alert,  pleasant and cooperative in NAD Head:  Normocephalic and atraumatic. Neck:  Supple; no masses or thyromegaly. Lungs:  Clear throughout to auscultation, normal respiratory effort.    Heart:  +S1, +S2, Regular rate and rhythm, No edema. Abdomen:  Soft, nontender and nondistended. Normal bowel sounds, without guarding, and without rebound.   Neurologic:  Alert and  oriented x4;  grossly normal neurologically.  Impression/Plan: Chaya Dehaan is here for an colonoscopy to be performed for diarrhea  Risks, benefits, limitations, and alternatives regarding  colonoscopy have been reviewed with the patient.  Questions have been answered.  All parties agreeable.   [name], MD  12/19/2020, 11:04 AM

## 2020-12-20 ENCOUNTER — Encounter: Payer: Self-pay | Admitting: Gastroenterology

## 2020-12-20 LAB — SURGICAL PATHOLOGY

## 2020-12-21 ENCOUNTER — Ambulatory Visit: Payer: Medicare HMO | Admitting: Oncology

## 2020-12-21 ENCOUNTER — Ambulatory Visit: Payer: Medicare HMO

## 2020-12-21 ENCOUNTER — Other Ambulatory Visit: Payer: Medicare HMO

## 2020-12-21 ENCOUNTER — Other Ambulatory Visit: Payer: Self-pay

## 2020-12-21 DIAGNOSIS — C9000 Multiple myeloma not having achieved remission: Secondary | ICD-10-CM

## 2020-12-22 ENCOUNTER — Encounter: Payer: Self-pay | Admitting: Hematology and Oncology

## 2020-12-22 ENCOUNTER — Inpatient Hospital Stay: Payer: Medicare HMO | Admitting: Oncology

## 2020-12-22 ENCOUNTER — Encounter: Payer: Self-pay | Admitting: Internal Medicine

## 2020-12-22 ENCOUNTER — Inpatient Hospital Stay: Payer: Medicare HMO

## 2020-12-22 ENCOUNTER — Encounter: Payer: Self-pay | Admitting: Oncology

## 2020-12-22 VITALS — BP 114/72 | HR 62 | Temp 97.7°F | Resp 18 | Wt 149.8 lb

## 2020-12-22 DIAGNOSIS — Z5111 Encounter for antineoplastic chemotherapy: Secondary | ICD-10-CM

## 2020-12-22 DIAGNOSIS — D709 Neutropenia, unspecified: Secondary | ICD-10-CM | POA: Diagnosis not present

## 2020-12-22 DIAGNOSIS — E538 Deficiency of other specified B group vitamins: Secondary | ICD-10-CM | POA: Diagnosis not present

## 2020-12-22 DIAGNOSIS — G620 Drug-induced polyneuropathy: Secondary | ICD-10-CM | POA: Diagnosis not present

## 2020-12-22 DIAGNOSIS — T451X5A Adverse effect of antineoplastic and immunosuppressive drugs, initial encounter: Secondary | ICD-10-CM

## 2020-12-22 DIAGNOSIS — D649 Anemia, unspecified: Secondary | ICD-10-CM

## 2020-12-22 DIAGNOSIS — C9000 Multiple myeloma not having achieved remission: Secondary | ICD-10-CM

## 2020-12-22 DIAGNOSIS — Z95828 Presence of other vascular implants and grafts: Secondary | ICD-10-CM | POA: Diagnosis not present

## 2020-12-22 DIAGNOSIS — R110 Nausea: Secondary | ICD-10-CM | POA: Diagnosis not present

## 2020-12-22 DIAGNOSIS — I350 Nonrheumatic aortic (valve) stenosis: Secondary | ICD-10-CM | POA: Diagnosis not present

## 2020-12-22 DIAGNOSIS — Z5112 Encounter for antineoplastic immunotherapy: Secondary | ICD-10-CM | POA: Diagnosis not present

## 2020-12-22 DIAGNOSIS — K529 Noninfective gastroenteritis and colitis, unspecified: Secondary | ICD-10-CM | POA: Diagnosis not present

## 2020-12-22 DIAGNOSIS — D801 Nonfamilial hypogammaglobulinemia: Secondary | ICD-10-CM | POA: Diagnosis not present

## 2020-12-22 DIAGNOSIS — R197 Diarrhea, unspecified: Secondary | ICD-10-CM

## 2020-12-22 DIAGNOSIS — Z9484 Stem cells transplant status: Secondary | ICD-10-CM

## 2020-12-22 DIAGNOSIS — K521 Toxic gastroenteritis and colitis: Secondary | ICD-10-CM | POA: Diagnosis not present

## 2020-12-22 LAB — COMPREHENSIVE METABOLIC PANEL
ALT: 28 U/L (ref 0–44)
AST: 30 U/L (ref 15–41)
Albumin: 4.2 g/dL (ref 3.5–5.0)
Alkaline Phosphatase: 82 U/L (ref 38–126)
Anion gap: 11 (ref 5–15)
BUN: 29 mg/dL — ABNORMAL HIGH (ref 8–23)
CO2: 18 mmol/L — ABNORMAL LOW (ref 22–32)
Calcium: 9.4 mg/dL (ref 8.9–10.3)
Chloride: 112 mmol/L — ABNORMAL HIGH (ref 98–111)
Creatinine, Ser: 1.21 mg/dL — ABNORMAL HIGH (ref 0.44–1.00)
GFR, Estimated: 50 mL/min — ABNORMAL LOW (ref >60)
Glucose, Bld: 152 mg/dL — ABNORMAL HIGH (ref 70–99)
Potassium: 3.8 mmol/L (ref 3.5–5.1)
Sodium: 141 mmol/L (ref 135–145)
Total Bilirubin: 0.5 mg/dL (ref 0.3–1.2)
Total Protein: 6.9 g/dL (ref 6.5–8.1)

## 2020-12-22 LAB — CBC WITH DIFFERENTIAL/PLATELET
Abs Immature Granulocytes: 0.02 10*3/uL (ref 0.00–0.07)
Basophils Absolute: 0 10*3/uL (ref 0.0–0.1)
Basophils Relative: 0 %
Eosinophils Absolute: 0.1 10*3/uL (ref 0.0–0.5)
Eosinophils Relative: 2 %
HCT: 33.5 % — ABNORMAL LOW (ref 36.0–46.0)
Hemoglobin: 11.5 g/dL — ABNORMAL LOW (ref 12.0–15.0)
Immature Granulocytes: 0 %
Lymphocytes Relative: 15 %
Lymphs Abs: 1 10*3/uL (ref 0.7–4.0)
MCH: 27.8 pg (ref 26.0–34.0)
MCHC: 34.3 g/dL (ref 30.0–36.0)
MCV: 80.9 fL (ref 80.0–100.0)
Monocytes Absolute: 0.5 10*3/uL (ref 0.1–1.0)
Monocytes Relative: 8 %
Neutro Abs: 5 10*3/uL (ref 1.7–7.7)
Neutrophils Relative %: 75 %
Platelets: 118 10*3/uL — ABNORMAL LOW (ref 150–400)
RBC: 4.14 MIL/uL (ref 3.87–5.11)
RDW: 16.3 % — ABNORMAL HIGH (ref 11.5–15.5)
WBC: 6.6 10*3/uL (ref 4.0–10.5)
nRBC: 0 % (ref 0.0–0.2)

## 2020-12-22 LAB — MAGNESIUM: Magnesium: 1 mg/dL — ABNORMAL LOW (ref 1.7–2.4)

## 2020-12-22 MED ORDER — SODIUM CHLORIDE 0.9 % IV SOLN
INTRAVENOUS | Status: DC
  Filled 2020-12-22: qty 250

## 2020-12-22 MED ORDER — HEPARIN SOD (PORK) LOCK FLUSH 100 UNIT/ML IV SOLN
500.0000 [IU] | Freq: Once | INTRAVENOUS | Status: AC
  Administered 2020-12-22: 500 [IU] via INTRAVENOUS
  Filled 2020-12-22: qty 5

## 2020-12-22 MED ORDER — MAGNESIUM SULFATE 2 GM/50ML IV SOLN
2.0000 g | Freq: Once | INTRAVENOUS | Status: AC
  Administered 2020-12-22: 2 g via INTRAVENOUS
  Filled 2020-12-22: qty 50

## 2020-12-22 MED ORDER — SODIUM CHLORIDE 0.9 % IV SOLN: 6.0000 g | Freq: Once | INTRAVENOUS | Status: DC

## 2020-12-22 MED ORDER — MAGNESIUM SULFATE 4 GM/100ML IV SOLN
4.0000 g | Freq: Once | INTRAVENOUS | Status: AC
  Administered 2020-12-22: 4 g via INTRAVENOUS
  Filled 2020-12-22: qty 100

## 2020-12-22 MED ORDER — SODIUM CHLORIDE 0.9 % IV SOLN
25.0000 mg | Freq: Once | INTRAVENOUS | Status: AC
  Administered 2020-12-22: 25 mg via INTRAVENOUS
  Filled 2020-12-22: qty 1

## 2020-12-22 MED ORDER — DIPHENOXYLATE-ATROPINE 2.5-0.025 MG PO TABS: 2.0000 | ORAL_TABLET | Freq: Four times a day (QID) | ORAL | 0 refills | Status: DC | PRN

## 2020-12-22 MED ORDER — SODIUM CHLORIDE 0.9% FLUSH
10.0000 mL | INTRAVENOUS | Status: DC | PRN
Start: 2020-12-22 — End: 2020-12-22
  Administered 2020-12-22: 10 mL via INTRAVENOUS
  Filled 2020-12-22: qty 10

## 2020-12-22 MED ORDER — BORTEZOMIB CHEMO IV INJECTION 3.5 MG (1MG/ML-GENERIC)
2.0000 mg | Freq: Once | INTRAVENOUS | Status: AC
  Administered 2020-12-22: 2 mg via INTRAVENOUS
  Filled 2020-12-22: qty 2

## 2020-12-22 NOTE — Patient Instructions (Signed)
[facility]  Discharge Instructions: Thank you for choosing [facility] to provide your oncology and hematology care.  If you have a lab appointment with the [facility], please go directly to the [facility] and check in at the registration area.  Wear comfortable clothing and clothing appropriate for easy access to any Portacath or PICC line.   We strive to give you quality time with your provider. You may need to reschedule your appointment if you arrive late (15 or more minutes).  Arriving late affects you and other patients whose appointments are after yours.  Also, if you miss three or more appointments without notifying the office, you may be dismissed from the clinic at the provider's discretion.      For prescription refill requests, have your pharmacy contact our office and allow 72 hours for refills to be completed.    Today you received the following chemotherapy and/or immunotherapy agents Velcade      To help prevent nausea and vomiting after your treatment, we encourage you to take your nausea medication as directed.  BELOW ARE SYMPTOMS THAT SHOULD BE REPORTED IMMEDIATELY: *FEVER GREATER THAN 100.4 F (38 C) OR HIGHER *CHILLS OR SWEATING *NAUSEA AND VOMITING THAT IS NOT CONTROLLED WITH YOUR NAUSEA MEDICATION *UNUSUAL SHORTNESS OF BREATH *UNUSUAL BRUISING OR BLEEDING *URINARY PROBLEMS (pain or burning when urinating, or frequent urination) *BOWEL PROBLEMS (unusual diarrhea, constipation, pain near the anus) TENDERNESS IN MOUTH AND THROAT WITH OR WITHOUT PRESENCE OF ULCERS (sore throat, sores in mouth, or a toothache) UNUSUAL RASH, SWELLING OR PAIN  UNUSUAL VAGINAL DISCHARGE OR ITCHING   Items with * indicate a potential emergency and should be followed up as soon as possible or go to the Emergency Department if any problems should occur.  Please show the CHEMOTHERAPY ALERT CARD or IMMUNOTHERAPY ALERT CARD at check-in to  the Emergency Department and triage nurse.  Should you have questions after your visit or need to cancel or reschedule your appointment, please contact [facility]  [phone] and follow the prompts.  Office hours are 8:00 a.m. to 4:30 p.m. Monday - Friday. Please note that voicemails left after 4:00 p.m. may not be returned until the following business day.  We are closed weekends and major holidays. You have access to a nurse at all times for urgent questions. Please call the main number to the clinic [phone] and follow the prompts.  For any non-urgent questions, you may also contact your provider using MyChart. We now offer e-Visits for anyone 57 and older to request care online for non-urgent symptoms. For details visit [email or web address].   Also download the MyChart app! Go to the app store, search "MyChart", open the app, select [facility], and log in with your MyChart username and password.  Due to Covid, a mask is required upon entering the hospital/clinic. If you do not have a mask, one will be given to you upon arrival. For doctor visits, patients may have 1 support person aged 54 or older with them. For treatment visits, patients cannot have anyone with them due to current Covid guidelines and our immunocompromised population.   Magnesium Sulfate Injection What is this medication? MAGNESIUM SULFATE (mag NEE zee um SUL fate) prevents and treats low levels of magnesium in your body. It may also be used to prevent and treat seizures during pregnancy in people with high blood pressure disorders, such as preeclampsia or eclampsia. Magnesium plays an important role in  maintaining thehealth of your muscles and nervous system. This medicine may be used for other purposes; ask your health care provider orpharmacist if you have questions. What should I tell my care team before I take this medication? They need to know if you have any of these  conditions: Heart disease History of irregular heart beat Kidney disease An unusual or allergic reaction to magnesium sulfate, medications, foods, dyes, or preservatives Pregnant or trying to get pregnant Breast-feeding How should I use this medication? This medication is for infusion into a vein. It is given in a hospital orclinic setting. Talk to your care team about the use of this medication in children. While thismedication may be prescribed for selected conditions, precautions do apply. Overdosage: If you think you have taken too much of this medicine contact apoison control center or emergency room at once. NOTE: This medicine is only for you. Do not share this medicine with others. What if I miss a dose? This does not apply. What may interact with this medication? Certain medications for anxiety or sleep Certain medications for seizures like phenobarbital Digoxin Medications that relax muscles for surgery Narcotic medications for pain This list may not describe all possible interactions. Give your health care provider a list of all the medicines, herbs, non-prescription drugs, or dietary supplements you use. Also tell them if you smoke, drink alcohol, or use illegaldrugs. Some items may interact with your medicine. What should I watch for while using this medication? Your condition will be monitored carefully while you are receiving this medication. You may need blood work done while you are receiving thismedication. What side effects may I notice from receiving this medication? Side effects that you should report to your care team as soon as possible: Allergic reactions-skin rash, itching, hives, swelling of the face, lips, tongue, or throat High magnesium level-confusion, drowsiness, facial flushing, redness, sweating, muscle weakness, fast or irregular heartbeat, trouble breathing Low blood pressure-dizziness, feeling faint or lightheaded, blurry vision Side effects that  usually do not require medical attention (report to your careteam if they continue or are bothersome): Headache Nausea This list may not describe all possible side effects. Call your doctor for medical advice about side effects. You may report side effects to FDA at1-800-FDA-1088. Where should I keep my medication? This medication is given in a hospital or clinic and will not be stored at home. NOTE: This sheet is a summary. It may not cover all possible information. If you have questions about this medicine, talk to your doctor, pharmacist, orhealth care provider.  2022 [employer] Standard (2020-08-07 13:10:26)

## 2020-12-22 NOTE — Progress Notes (Signed)
Hematology Oncology Progress Note  Clinic Day:  12/22/2020   Referring physician: Glean Hess, MD  Chief Complaint: Sarah [name] is a 65 y.o. 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   Initial bone marrow revealed 80% plasma cells.  Lambda free light chains were 1340.  She had nephrotic range proteinuria.  She initially underwent induction with RVD.  Revlimid maintenance was discontinued on 01/21/2017 secondary to intolerance.     07/19/2019 Bone marrow aspirate and biopsy on  revealed a normocellular marrow with but increased lambda-restricted plasma cells (9% aspirate, 40% CD138 immunohistochemistry).  Findings were consistent with recurrent plasma cell myeloma.  Flow cytometry revealed no monoclonal B-cell or phenotypically aberrant T-cell population. Cytogenetics were 66, XX (normal).  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    Lambda light chains have been followed: 22.2 (ratio 0.56) on 07/03/2017, 30.8 (ratio 0.78) on 09/02/2017, 36.9 (ratio 0.40) on 10/21/2017, 37.4 (ratio 0.41) on 12/16/2017, 70.7(ratio 0.31)  on 02/17/2018, 64.2 (ratio 0.27) on 04/07/2018, 78.9 (ratio 0.18) on 05/26/2018, 128.8 (ratio 0.17) on 08/06/2018, 181.5 (ratio 0.13) on 10/08/2018, 130.9 (ratio 0.13) on 10/20/2018, 160.7 (ratio 0.10) on 12/09/2018, 236.6 (ratio 0.07) on 02/01/2019, 363.6 (ratio  0.04) on 03/22/2019, 404.8 (ratio 0.04) on 04/05/2019, 420.7 (ratio 0.03) on 05/24/2019, 573.4 (ratio 0.03) on 06/23/2019, 451.05 (ratio 0.02) on 08/20/2019, 47.2 (ratio 0.13) on 10/25/2019, 22.4 (ratio 0.21) on 11/25/2019, 16.5 (ratio 0.33) on 01/10/2020, 14.6 (ratio 0.34) on 02/07/2020, 13.1 (ratio 0.31) on 03/07/2020, 10.1 (ratio 0.38) on 04/10/2020, and 9.5 (ratio 0.21) on 06/19/2020.   24 hour UPEP on 06/03/2019 revealed kappa free light chains 95.76, lambda free light chains 1,260.71, and ratio 0.08.  24 hour UPEP on 08/23/2019 revealed total protein of 782 mg/24 hrs with lambda free light chains 1,084.16 mg/L and ratio of 0.10 (1.03-31.76).  M spike in urine was 46.1% (361 mg/24 hrs).    Bone survey on 04/08/2016 and 05/28/2017 revealed no definite lytic lesion seen in the visualized skeleton.  Bone survey on 11/19/2018 revealed no suspicious lucent lesions and no acute bony abnormality.  PET scan on 07/12/2019 revealed no focal metabolic activity to suggest active myeloma within the skeleton. There were no lytic lesions identified on the CT portion of the exam or soft tissue plasmacytomas. There was no evidence of multiple myeloma.    Pretreatment RBC phenotype on 09/23/2019 was positive for C, e, DUFFY B, KIDD B, M, S, and s antigen; negative for c, E, KELL, DUFFY A, KIDD A, and N antigen.    09/27/2019 - 10/25/2019; 12/09/2019 - 03/13/2020 6 cycles of daratumumab and hyaluronidase-fihj, Pomalyst, and Decadron (DPd) .  Cycle #1 was complicated by fever and neutropenia requiring admission.  Cycle #2 was complicated by pneumonia requiring admission.  Cycle #6 was complicated with an ER evaluation for an elevated lactic acid.   05/10/2020 second autologous stem cell transplant at [facility]   She underwent conditioning with melphalan 140 mg/m2.  She is s/p week #3 Velcade maintenance (began 08/31/2020 - 09/28/2020).  She has no increase in neuropathy.  She has severe aortic stenosis.  Echo on  05/21/2020 revealed severe aortic valve stenosis (tricuspid valve with 2 leaflets fused) and an EF of 45-50%. Cardiology is following for a possible TAVR in the future.  Echo on 07/05/2020 revealed moderate to severe aortic stenosis with an EF of 50-55%.  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 Zometa was discontinued in 01/2017.  She has chronic nausea on Phenergan.     B12 deficiency.  B12 was 254 on 04/09/2017, 295 on 08/20/2018, and 391 on 10/08/2018.  She was on oral B12.  She received B12 monthly (last 09/14/2020).  Folate was 12.1 on 01/10/2020.    She has iron deficiency.  Ferritin was 32 on 07/01/2019.  She received Venofer on 07/15/2019 and 07/22/2019.   She has hypogammaglobulinemia.  IgG was 245 on 11/25/2019.  She received monthly IVIG (07/22/021 - 03/22/2020).  She received IVIG 400 mg/kg on 01/20/2020, 200 mg/kg on 02/17/2020, and 300 mg/kg on 03/22/2020.  IVIG on 17/61/6073 was complicated by acute renal failure. IgG trough level was 418 on 02/17/2020.  10/15/2019 - 10/20/2019 She was admitted to [facility] with fever and neutropenia.  Cultures were negative.  CXR was negative. She received broad spectrum antibiotics and daily Granix.  She received IVF for acute renal insufficiency due to diarrhea and dehydration.  Creatine was 1.66 on admission and 1.12 on discharge.    03/01/2020 - 03/05/2020 admitted to [facility] from with fever and neutropenia. CXR revealed no active cardiopulmonary disease. Chest CT with contrast revealed no acute intrathoracic pathology. There were findings which could be suggestive of prior granulomatous disease. She was treated with Cefepime and Vancomycin, then switched to ciprofloxacin on 03/03/2020.   05/08/2020 - 12/05/2021The patient was admitted to [facility] from  for autologous bone marrow transplant.   She received conditioning with melphalan 140 mg/m2.  Bone marrow transplant was on 05/10/2020. The patient developed fevers daily from  05/19/2020 - 05/24/2020. Blood cultures were + for strept sanguis bacteremia.  She was treated cefepime, vancomycin, and solumedrol for possible engraftment syndrome. Cefepime was switched to ceftriaxone; she completed 2 weeks of ceftriaxone on 06/03/2020. She had chemotherapy induced diarrhea.  She experienced urinary retention.  Feb 2022 patient has been on maintenance Velcade 2 mg every 2 weeks.  Chronic diarrhea seen by gastroenterology Dr. Vicente Males and was recommended to avoid artificial sugars in her diet including Diet Coke and coffee mate.  She feels some improvement of her diarrhea frequency since the dietary modification.  GI profile PCR negative, C. difficile toxin A+ B negative, fecal calprotectin 77  diabetes, patient has discontinued metformin due to diarrhea and started on glipizide 2.5 mg  INTERVAL HISTORY Sarah [name] is a 65 y.o. female who has above history reviewed by me today presents for follow up visit for management of multiple myeloma Problems and complaints are listed below:  # 12/19/2020 colonoscopy showed small colon polyps- pathology showed tubular adenoma. No malignancy.   Chronic diarrhea and patient takes Lomotil.2 tablets  every 4 hours as needed.  She requires a refill.  She also has Imodium which she only takes if she runs out of Lomotil.  12/22/2020 colonoscopy showed 2 subcentimeter polyps in the ascending colon, resected and removed.  Pathology showed tubular adenoma.  No malignancy.  Normal mucosa in the entire examined colon.  Biopsied, pathology showed colonic mucosa with no significant  pathological alteration.  Negative for active inflammation and features of chronicity.  Negative for microscopic colitis, dysplasia, and malignancy.  chronic neuropathy, symptoms are stable.  Patient was seen by her [facility] oncologist Dr. Fatima Sanger who recommends switching Velcade to subcutaneous.  Patient has chronic magnesium, gets 4 to 6 g of IV magnesium with each visit.  She  was recommended by GI to stop Slow-Mag as that may also precipitate her diarrhea symptoms.  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/25/2017   Due to Zometa   Cardiomyopathy, idiopathic ([place]) 08/21/2017   Overview:  Septal and apical hypokinesis with ef 40%   CHF (congestive heart failure) (HCC)    CKD (chronic kidney disease) stage 3, GFR 30-59 ml/min (HCC)    Depression    Diabetes mellitus (HCC)    Dizziness    Fatty liver    Frequent falls    GERD (gastroesophageal reflux disease)    Gout    Heart murmur    History of blood transfusion    History of bone marrow transplant ([place])    History of uterine fibroid    Hx of cardiac catheterization 06/05/2016   Overview:  Normal coronaries 2017   Hypertension    Hypomagnesemia    IDA (iron deficiency anemia)    Multiple myeloma ([place])    Personal history of chemotherapy    Renal cyst    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[place]) 06/05/2016   Overview:  Sp ablation 1999    Past Surgical History:  Procedure Laterality Date   ABDOMINAL HYSTERECTOMY     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   CARDIAC ELECTROPHYSIOLOGY MAPPING AND ABLATION     CARPAL TUNNEL RELEASE Bilateral    CHOLECYSTECTOMY  2008   COLONOSCOPY WITH PROPOFOL N/A 05/07/2017   Procedure: COLONOSCOPY WITH PROPOFOL;  Surgeon: [name], MD;  Location: [facility] ENDOSCOPY;  Service: Gastroenterology;  Laterality: N/A;   COLONOSCOPY WITH PROPOFOL N/A 12/19/2020   Procedure: COLONOSCOPY WITH PROPOFOL;  Surgeon: [name], MD;  Location: [facility] ENDOSCOPY;  Service: Gastroenterology;  Laterality: N/A;   ESOPHAGOGASTRODUODENOSCOPY (EGD) WITH PROPOFOL N/A 05/07/2017   Procedure:  ESOPHAGOGASTRODUODENOSCOPY (EGD) WITH PROPOFOL;  Surgeon: [name], MD;  Location: [facility] ENDOSCOPY;  Service: Gastroenterology;  Laterality: N/A;   FOOT SURGERY Bilateral    INCONTINENCE SURGERY  2009   INTERSTIM IMPLANT PLACEMENT     other     over active bladder   OTHER SURGICAL HISTORY     bladder stimulator    PARTIAL HYSTERECTOMY  03/1996   fibroids   PORTA CATH INSERTION N/A 03/10/2019   Procedure: PORTA CATH INSERTION;  Surgeon: Algernon Huxley, MD;  Location: [place] CV LAB;  Service: Cardiovascular;  Laterality: N/A;   TONSILLECTOMY  2007    Family History  Problem Relation Age of Onset   Colon cancer Father    Renal Disease Father    Diabetes Mellitus II Father    Melanoma Paternal Grandmother    Breast cancer Maternal Aunt 89   Anemia Mother    Heart disease Mother    Heart failure Mother    Renal Disease Mother    Congestive Heart Failure Mother  Heart disease Maternal Uncle    Throat cancer Maternal Uncle    Lung cancer Maternal Uncle    Liver disease Maternal Uncle    Heart failure Maternal Uncle    Hearing loss Son 46       Suicide     Social History:  reports that she quit smoking about 29 years ago. Her smoking use included cigarettes. She has a 20.00 pack-year smoking history. She has never used smokeless tobacco. She reports current alcohol use. She reports that she does not use drugs.  She is on disability. She notes exposure to perchloroethylene [facility]).  She lives much of her adult life in [place].   Her husband passed away.    Allergies:  Allergies  Allergen Reactions   Oxycodone-Acetaminophen Anaphylaxis    Swelling and rash   Celebrex [Celecoxib] Diarrhea   Codeine    Plerixafor     In 2016 during ASCT collection patient developed fever to 103.75F and required hospitalization   Benadryl [Diphenhydramine] Palpitations   Morphine Itching and Rash   Ondansetron Diarrhea   Tylenol [Acetaminophen] Itching and Rash    Current  Medications: Current Outpatient Medications  Medication Sig Dispense Refill   acetaminophen (TYLENOL) 500 MG tablet Take 500 mg by mouth every 6 (six) hours as needed.     acyclovir (ZOVIRAX) 400 MG tablet Take 400 mg by mouth 2 (two) times daily.     allopurinol (ZYLOPRIM) 100 MG tablet TAKE 1 TABLET(100 MG) BY MOUTH DAILY as needed 90 tablet 1   bisoprolol (ZEBETA) 5 MG tablet Take 5 mg by mouth daily.     diclofenac sodium (VOLTAREN) 1 % GEL Apply 2 g topically 4 (four) times daily. 100 g 1   diphenhydrAMINE (BENADRYL) 50 MG capsule Take 50 mg by mouth as needed.     FLUoxetine (PROZAC) 40 MG capsule TAKE 1 CAPSULE EVERY DAY 90 capsule 1   fluticasone (FLONASE) 50 MCG/ACT nasal spray Place 2 sprays into both nostrils daily. 16 g 2   glucose blood test strip      loperamide (IMODIUM) 2 MG capsule Take 1 capsule (2 mg total) by mouth 4 (four) times daily as needed for diarrhea or loose stools. 120 capsule 3   methocarbamol (ROBAXIN) 500 MG tablet TAKE 1 TABLET FOUR TIMES DAILY. 120 tablet 0   Multiple Vitamin (MULTIVITAMIN) tablet Take 1 tablet by mouth daily.     Multiple Vitamins-Minerals (HAIR/SKIN/NAILS) TABS Take 1 tablet by mouth daily.     omeprazole (PRILOSEC) 40 MG capsule Take 1 capsule (40 mg total) by mouth in the morning and at bedtime. 180 capsule 1   promethazine (PHENERGAN) 25 MG tablet Take 1 tablet (25 mg total) by mouth every 6 (six) hours as needed for nausea or vomiting. 90 tablet 0   promethazine-dextromethorphan (PROMETHAZINE-DM) 6.25-15 MG/5ML syrup Take 5 mLs by mouth 4 (four) times daily as needed for cough. 118 mL 0   traZODone (DESYREL) 100 MG tablet Take 1 tablet (100 mg total) by mouth at bedtime. 90 tablet 1   diphenoxylate-atropine (LOMOTIL) 2.5-0.025 MG tablet Take 2 tablets by mouth 4 (four) times daily as needed for diarrhea or loose stools. 90 tablet 0   fexofenadine (ALLEGRA) 180 MG tablet TAKE 1 TABLET(180 MG) BY MOUTH DAILY (Patient not taking: Reported on  12/22/2020) 30 tablet 5   lactulose (CHRONULAC) 10 GM/15ML solution Take 20 g by mouth daily as needed. (Patient not taking: Reported on 12/22/2020)     lidocaine (LIDODERM) 5 %  1 patch daily. (Patient not taking: Reported on 12/22/2020)     magnesium chloride (SLOW-MAG) 64 MG TBEC SR tablet Take 1 tablet (64 mg total) by mouth 2 (two) times daily. (Patient not taking: Reported on 12/22/2020) 60 tablet 1   No current facility-administered medications for this visit.   Facility-Administered Medications Ordered in Other Visits  Medication Dose Route Frequency Provider Last Rate Last Admin   0.9 %  sodium chloride infusion   Intravenous Continuous Lequita Asal, MD   Stopped at 01/20/20 1232   0.9 %  sodium chloride infusion   Intravenous Continuous Lequita Asal, MD   Stopped at 12/19/20 0856   0.9 %  sodium chloride infusion   Intravenous Continuous Earlie Server, MD 10 mL/hr at 11/30/20 0957 New Bag at 11/30/20 0957   0.9 %  sodium chloride infusion   Intravenous Continuous Earlie Server, MD 20 mL/hr at 12/22/20 0943 New Bag at 12/22/20 0943   heparin lock flush 100 unit/mL  500 Units Intravenous Once Faythe Casa E, NP       heparin lock flush 100 unit/mL  500 Units Intravenous Once Nolon Stalls C, MD       heparin lock flush 100 unit/mL  500 Units Intravenous Once Earlie Server, MD       magnesium sulfate IVPB 2 g 50 mL  2 g Intravenous Once Verlon Au, NP 50 mL/hr at 12/22/20 1043 2 g at 12/22/20 1043   magnesium sulfate IVPB 4 g 100 mL  4 g Intravenous Once Verlon Au, NP       sodium chloride flush (NS) 0.9 % injection 10 mL  10 mL Intravenous PRN Nolon Stalls C, MD   10 mL at 02/07/20 0815   sodium chloride flush (NS) 0.9 % injection 10 mL  10 mL Intracatheter PRN Cammie Sickle, MD   10 mL at 02/10/20 0815   sodium chloride flush (NS) 0.9 % injection 10 mL  10 mL Intravenous PRN Jacquelin Hawking, NP   10 mL at 03/01/20 1143   sodium chloride flush (NS) 0.9 %  injection 10 mL  10 mL Intravenous PRN Earlie Server, MD   10 mL at 12/22/20 3220    Review of Systems  Constitutional:  Negative for chills, fever, malaise/fatigue and weight loss.  HENT:  Negative for sore throat.   Eyes:  Negative for redness.  Respiratory:  Negative for cough, shortness of breath and wheezing.   Cardiovascular:  Negative for chest pain, palpitations and leg swelling.  Gastrointestinal:  Positive for diarrhea and nausea. Negative for abdominal pain, blood in stool and vomiting.  Genitourinary:  Negative for dysuria.  Musculoskeletal:  Positive for back pain and joint pain. Negative for myalgias.  Skin:  Negative for rash.  Neurological:  Positive for tingling and sensory change. Negative for dizziness and tremors.  Endo/Heme/Allergies:  Does not bruise/bleed easily.  Psychiatric/Behavioral:  Negative for hallucinations.    Performance status (ECOG): 1  Vitals Blood pressure 114/72, pulse 62, temperature 97.7 F (36.5 C), resp. rate 18, weight 149 lb 12.8 oz (67.9 kg).  Physical Exam Vitals and nursing note reviewed.  Constitutional:      General: She is not in acute distress.    Appearance: She is well-developed. She is not ill-appearing, toxic-appearing or diaphoretic.     Interventions: Face mask in place.  HENT:     Head: Normocephalic and atraumatic.     Mouth/Throat:     Mouth: Mucous membranes are  moist.     Pharynx: Oropharynx is clear.  Eyes:     General: No scleral icterus.    Pupils: Pupils are equal, round, and reactive to light.  Cardiovascular:     Rate and Rhythm: Normal rate and regular rhythm.     Heart sounds: Normal heart sounds. No murmur heard. Pulmonary:     Effort: Pulmonary effort is normal. No respiratory distress.     Breath sounds: Normal breath sounds. No wheezing or rales.  Chest:     Chest wall: No tenderness.  Breasts:    Right: No supraclavicular adenopathy.     Left: No supraclavicular adenopathy.  Abdominal:     General:  Bowel sounds are normal. There is no distension.     Palpations: Abdomen is soft. There is no hepatomegaly, splenomegaly or mass.     Tenderness: There is no abdominal tenderness. There is no guarding or rebound.  Musculoskeletal:        General: Swelling (right knee, subtle fullness) present. Tenderness: BLE, medial right knee.Normal range of motion.     Cervical back: Normal range of motion and neck supple.     Right lower leg: No edema.     Left lower leg: No edema.  Lymphadenopathy:     Head:     Right side of head: No preauricular, posterior auricular or occipital adenopathy.     Left side of head: No preauricular, posterior auricular or occipital adenopathy.     Cervical: No cervical adenopathy.     Upper Body:     Right upper body: No supraclavicular adenopathy.     Left upper body: No supraclavicular adenopathy.  Skin:    General: Skin is warm and dry.     Coloration: Skin is not jaundiced.  Neurological:     Mental Status: She is alert and oriented to person, place, and time. Mental status is at baseline.  Psychiatric:        Mood and Affect: Mood normal.   Laboratory data Infusion on 12/22/2020  Component Date Value Ref Range Status   Magnesium 12/22/2020 1.0 (A) 1.7 - 2.4 mg/dL Final   Performed at [facility], [place], [place] [address]   Sodium 12/22/2020 141  135 - 145 mmol/L Final   Potassium 12/22/2020 3.8  3.5 - 5.1 mmol/L Final   Chloride 12/22/2020 112 (A) 98 - 111 mmol/L Final   CO2 12/22/2020 18 (A) 22 - 32 mmol/L Final   Glucose, Bld 12/22/2020 152 (A) 70 - 99 mg/dL Final   Glucose reference range applies only to samples taken after fasting for at least 8 hours.   BUN 12/22/2020 29 (A) 8 - 23 mg/dL Final   Creatinine, Ser 12/22/2020 1.21 (A) 0.44 - 1.00 mg/dL Final   Calcium 12/22/2020 9.4  8.9 - 10.3 mg/dL Final   Total Protein 12/22/2020 6.9  6.5 - 8.1 g/dL Final   Albumin 12/22/2020 4.2  3.5 - 5.0 g/dL Final   AST 12/22/2020 30   15 - 41 U/L Final   ALT 12/22/2020 28  0 - 44 U/L Final   Alkaline Phosphatase 12/22/2020 82  38 - 126 U/L Final   Total Bilirubin 12/22/2020 0.5  0.3 - 1.2 mg/dL Final   GFR, Estimated 12/22/2020 50 (A) >60 mL/min Final   Comment: (NOTE) Calculated using the CKD-EPI Creatinine Equation (2021)    Anion gap 12/22/2020 11  5 - 15 Final   Performed at [facility], [place],  [place], [place] [address]   WBC 12/22/2020 6.6  4.0 - 10.5 K/uL Final   RBC 12/22/2020 4.14  3.87 - 5.11 MIL/uL Final   Hemoglobin 12/22/2020 11.5 (A) 12.0 - 15.0 g/dL Final   HCT 12/22/2020 33.5 (A) 36.0 - 46.0 % Final   MCV 12/22/2020 80.9  80.0 - 100.0 fL Final   MCH 12/22/2020 27.8  26.0 - 34.0 pg Final   MCHC 12/22/2020 34.3  30.0 - 36.0 g/dL Final   RDW 12/22/2020 16.3 (A) 11.5 - 15.5 % Final   Platelets 12/22/2020 118 (A) 150 - 400 K/uL Final   nRBC 12/22/2020 0.0  0.0 - 0.2 % Final   Neutrophils Relative % 12/22/2020 75  % Final   Neutro Abs 12/22/2020 5.0  1.7 - 7.7 K/uL Final   Lymphocytes Relative 12/22/2020 15  % Final   Lymphs Abs 12/22/2020 1.0  0.7 - 4.0 K/uL Final   Monocytes Relative 12/22/2020 8  % Final   Monocytes Absolute 12/22/2020 0.5  0.1 - 1.0 K/uL Final   Eosinophils Relative 12/22/2020 2  % Final   Eosinophils Absolute 12/22/2020 0.1  0.0 - 0.5 K/uL Final   Basophils Relative 12/22/2020 0  % Final   Basophils Absolute 12/22/2020 0.0  0.0 - 0.1 K/uL Final   Immature Granulocytes 12/22/2020 0  % Final   Abs Immature Granulocytes 12/22/2020 0.02  0.00 - 0.07 K/uL Final   Performed at [facility], [address], [place], [place] [address]    Assessment/Plan 1. Multiple myeloma not having achieved remission ([place])   2. Encounter for antineoplastic chemotherapy   3. Hypomagnesemia   4. Normocytic anemia   5. Chronic nausea   6. 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 Diarrhea, unspecified type   9. History of autologous stem cell transplant  ([place])   10. Port-A-Cath in place     # Recurrent lambda light chain multiple myeloma s/p second autologous bone marrow transplant [05/10/2020] Clinically patient is doing well.  She has been on Velcade maintenance every 2 weeks.   Labs reviewed and discussed with patient. proceed with Velcade IV 2 mg today. Labs are reviewed and discussed with patient Proceed with cycle 7 Velcade 2 mg today. 12/14/2020 kappa/light chain ratio trends up to 1.09, still within normal limits.  Close monitor.. Continue prophylaxis with Valtrex up to 1 year after transplant-November 2022 Vaccination at [place] transplant RN coordinator [phone]).  #Hyperglobulinemia post stem cell transplantation.  Currently she has no frequent infection, will hold off IVIG replacement therapy.  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  Patient prefers to be monitored clinically rather than starting neuropathy medication. We will switch to Velcade to subcutaneous.  Starting next cycle.  # iron deficiency anemia, Hemoglobin is stable at 11.5,  11/16/2020 iron panel showed increased iron saturation to 15.   #chronic diarrhea, Gastroenterology recommendation was reviewed.  Avoid artificial sugar.  Colonoscopy showed no microscopic colitis. Her symptom seems to have improved slightly after dietary modification. Velcade side effects may also contribute to diarrhea  #Chronic hypomagnesemia Magnesium is 1.0 today, proceed with 6 g of IV magnesium today. Off Slow-Mag 64 mg twice daily.  Per GI recommendation as this potentially precipitate her diarrhea. We will schedule patient to have magnesium checked twice a week +/- IV magnesium  #Chronic nausea, patient request to have IV Phenergan in the clinic with treatment.  She has antiemetics at home.  # B12 deficiency, patient has been on vitamin B12  monthly.    RTC: 1 week, lab magnesium/IV magnesium twice per week,  RTC in 2 weeks for labs (CBC with  diff, CMP, Mg, SPEP, light chain ratio), md  for 8 Velcade and IV Mg.  I discussed the assessment and treatment plan with the patient.  The patient was provided an opportunity to ask questions and all were answered.  The patient agreed with the plan and demonstrated an understanding of the instructions.  The patient was advised to call back if the symptoms worsen or if the condition fails to improve as anticipated.   Earlie Server, MD, PhD Hematology Oncology [place] at [facility] 12/22/2020

## 2020-12-22 NOTE — Progress Notes (Signed)
Pt here for follow up. Pt reports that she had a colonoscopy on Tuesday.

## 2020-12-22 NOTE — Addendum Note (Signed)
Addended by: Earlie Server on: 12/22/2020 04:38 PM   Modules accepted: Orders

## 2020-12-24 IMAGING — CR DG CHEST 2V
1 series · 2 of 2 positions shown · non-contrast
Comparison: Chest radiographs 03/07/2020 and earlier.

CLINICAL DATA: 64-year-old female with multiple myeloma,
hypotension and abnormal lactic acid today. Query infection.

EXAM:
CHEST - 2 VIEW

[Series 1: dg chest 2 view · 0.14mm/px · 2 of 2 slices shown]
[im 1/2]
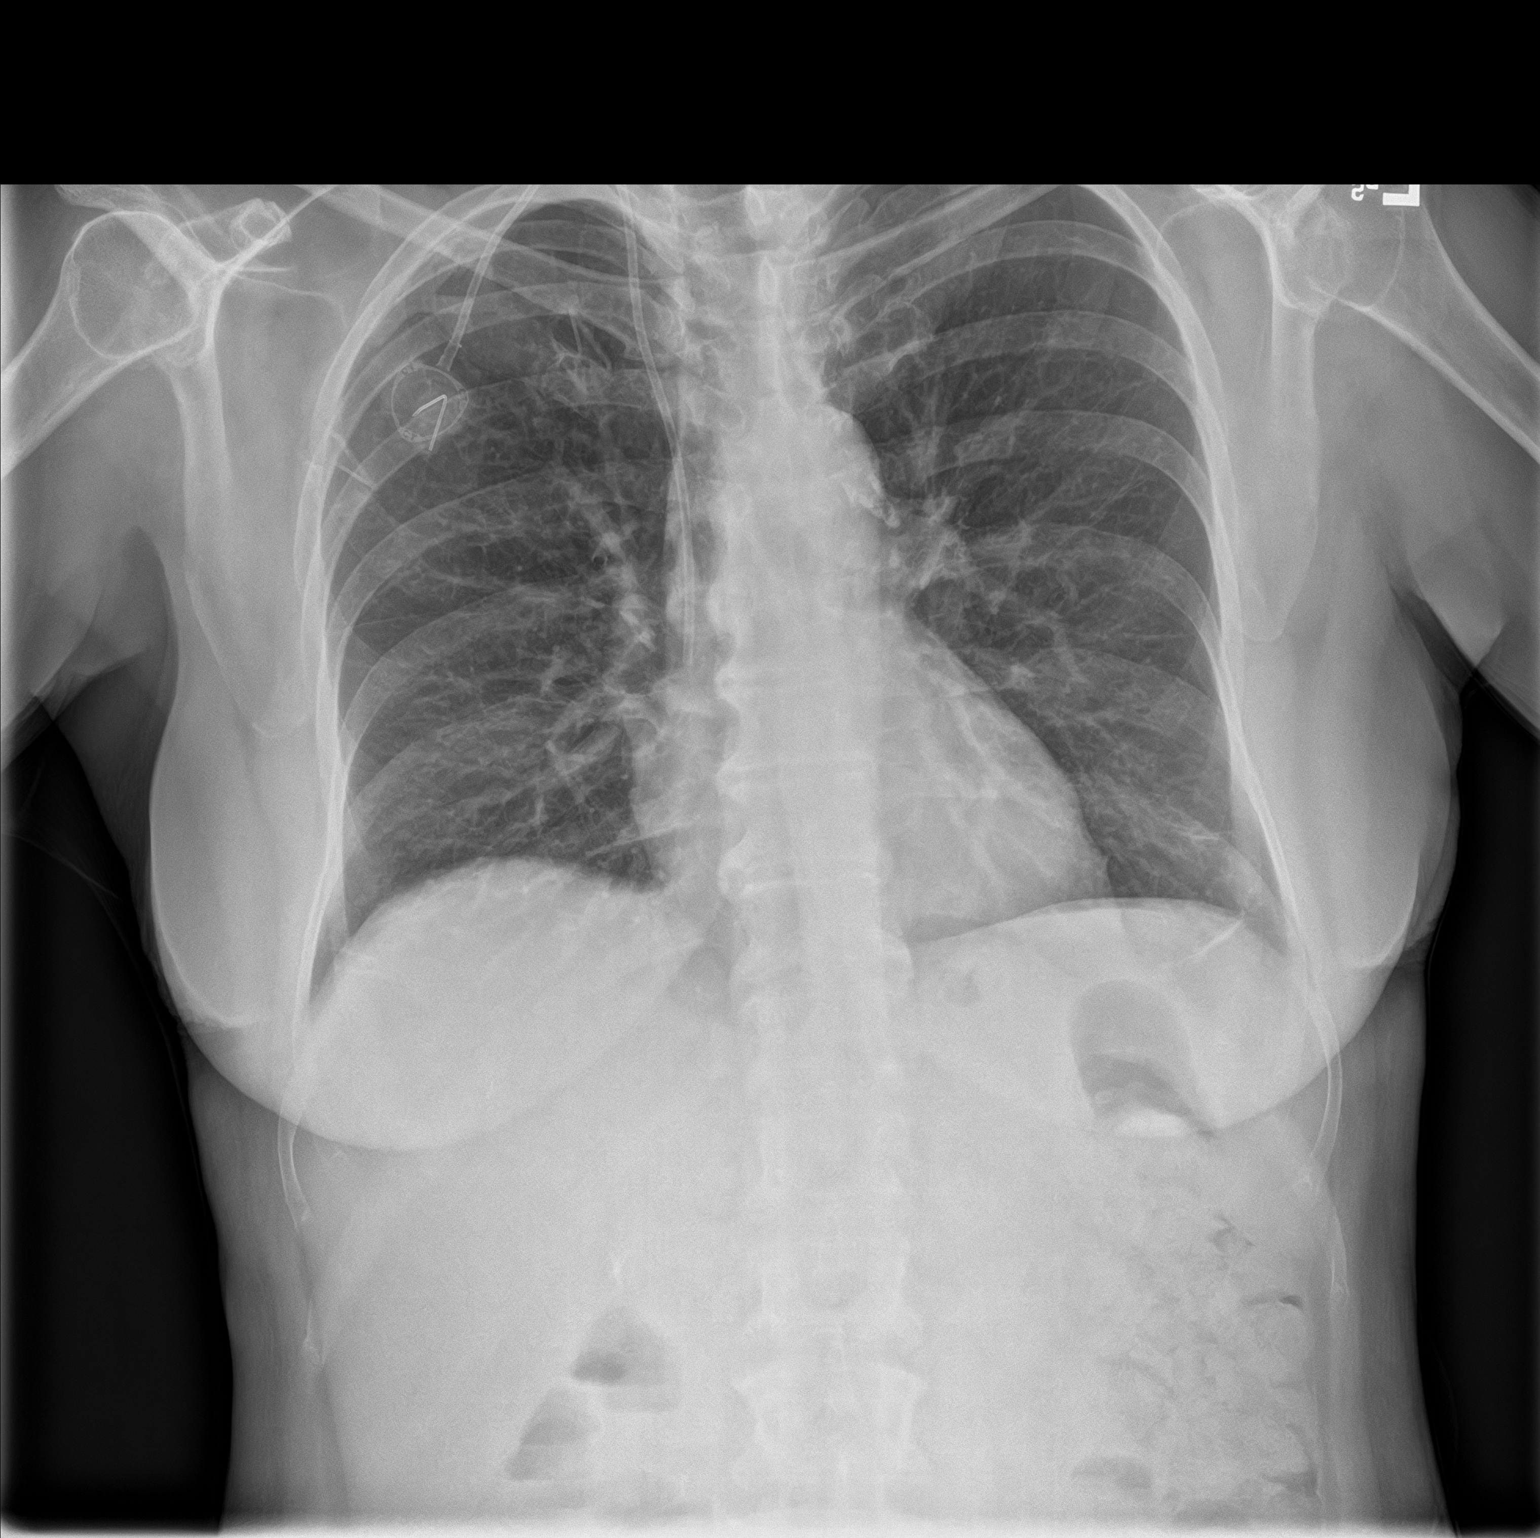
[im 2/2]
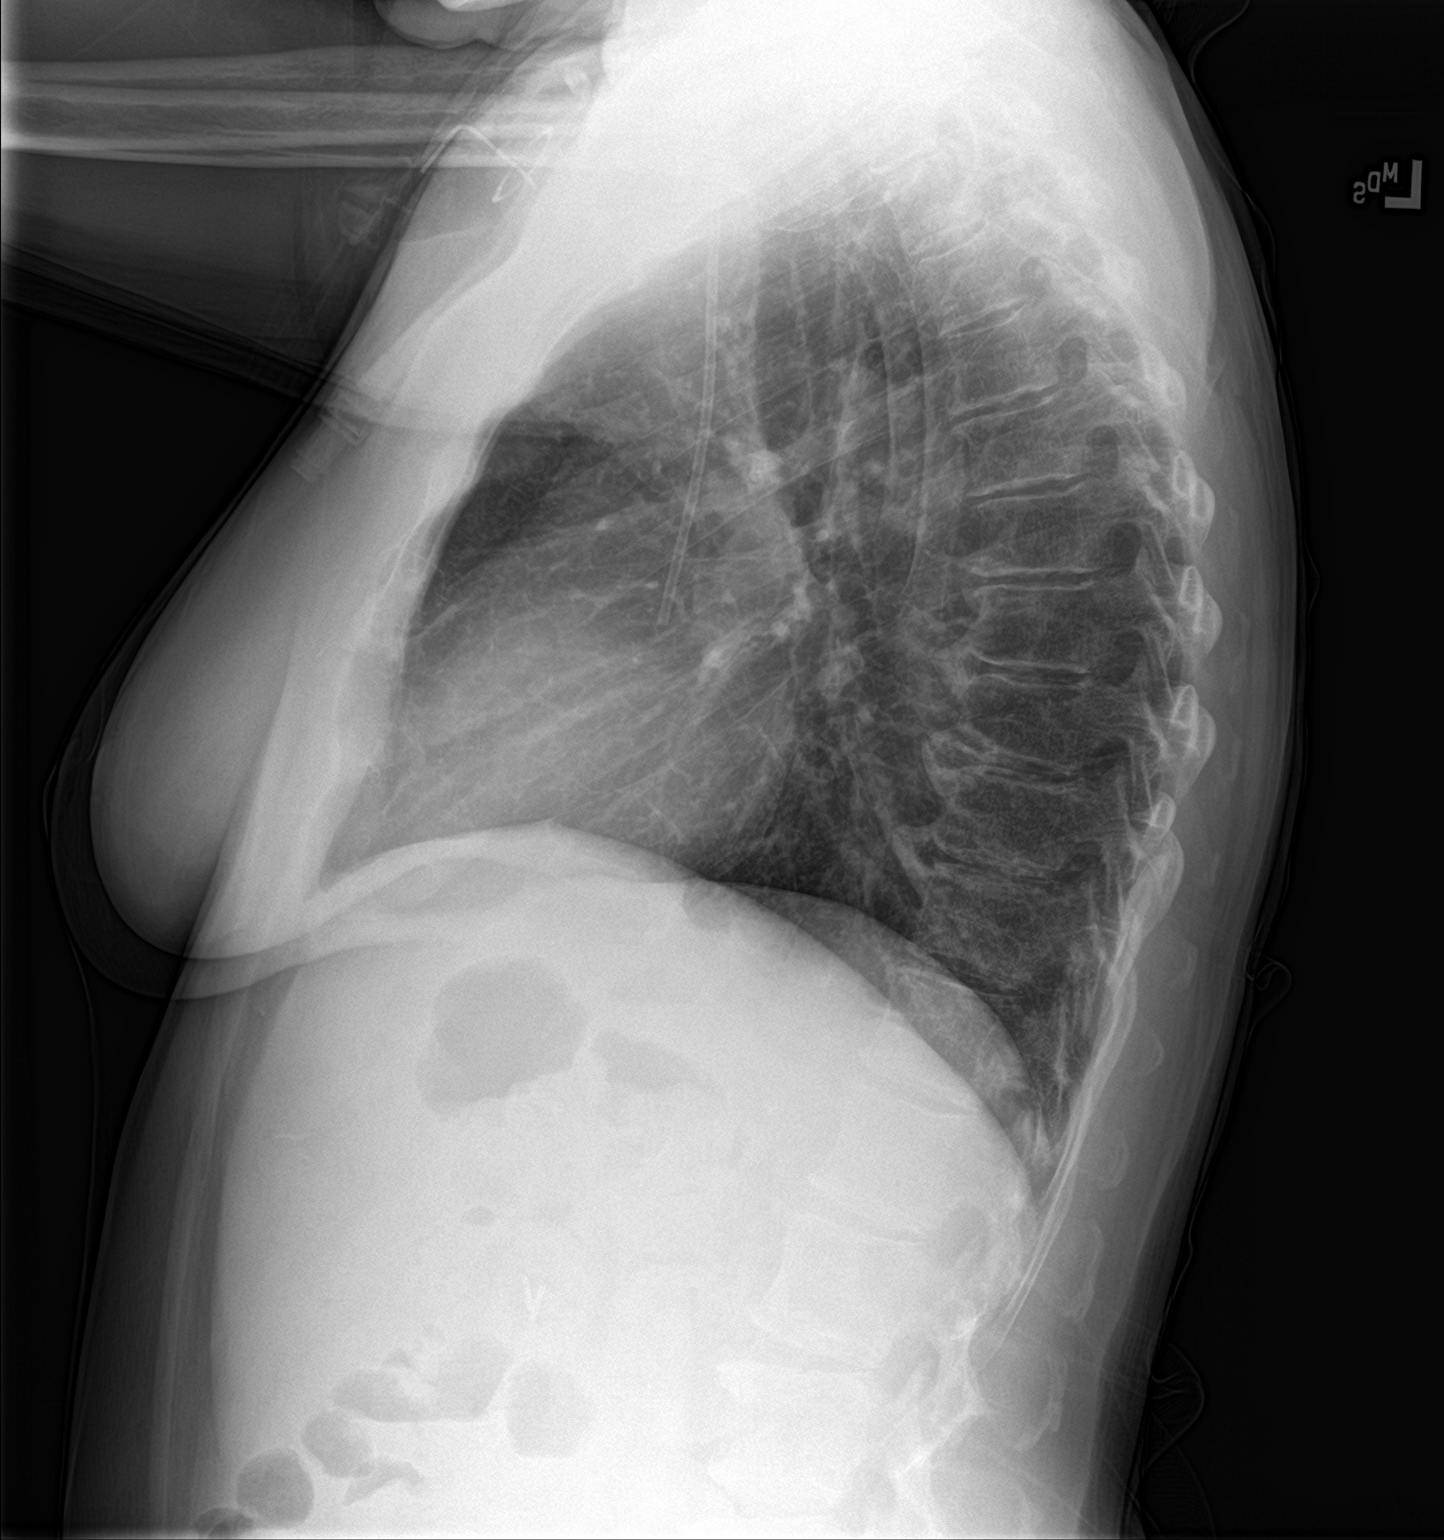

[2 of 2 positions shown; findings below may reference images not displayed]

FINDINGS: Stable right chest power port. Mildly lower lung volumes.
Mediastinal contours remain normal. Visualized tracheal air column
is within normal limits. The lungs appear stable and clear. No
pneumothorax or pleural effusion.

Stable visualized osseous structures. Stable cholecystectomy clips.
Negative visible bowel gas pattern.
IMPRESSION: Lower lung volumes.  No acute cardiopulmonary abnormality.

## 2020-12-26 ENCOUNTER — Ambulatory Visit
Admission: RE | Admit: 2020-12-26 | Discharge: 2020-12-26 | Disposition: A | Discharge status: Home / Self Care | Payer: Medicare HMO | Source: Ambulatory Visit | Attending: Otolaryngology | Admitting: Otolaryngology

## 2020-12-26 ENCOUNTER — Encounter: Payer: Self-pay | Admitting: *Deleted

## 2020-12-26 ENCOUNTER — Other Ambulatory Visit: Payer: Self-pay | Admitting: Oncology

## 2020-12-26 ENCOUNTER — Inpatient Hospital Stay: Payer: Medicare HMO

## 2020-12-26 ENCOUNTER — Other Ambulatory Visit: Payer: Self-pay

## 2020-12-26 DIAGNOSIS — I350 Nonrheumatic aortic (valve) stenosis: Secondary | ICD-10-CM | POA: Diagnosis not present

## 2020-12-26 DIAGNOSIS — C9000 Multiple myeloma not having achieved remission: Secondary | ICD-10-CM

## 2020-12-26 DIAGNOSIS — E042 Nontoxic multinodular goiter: Secondary | ICD-10-CM | POA: Diagnosis not present

## 2020-12-26 DIAGNOSIS — E538 Deficiency of other specified B group vitamins: Secondary | ICD-10-CM | POA: Diagnosis not present

## 2020-12-26 DIAGNOSIS — Z5112 Encounter for antineoplastic immunotherapy: Secondary | ICD-10-CM | POA: Diagnosis not present

## 2020-12-26 DIAGNOSIS — K529 Noninfective gastroenteritis and colitis, unspecified: Secondary | ICD-10-CM | POA: Diagnosis not present

## 2020-12-26 DIAGNOSIS — D709 Neutropenia, unspecified: Secondary | ICD-10-CM | POA: Diagnosis not present

## 2020-12-26 DIAGNOSIS — D801 Nonfamilial hypogammaglobulinemia: Secondary | ICD-10-CM | POA: Diagnosis not present

## 2020-12-26 DIAGNOSIS — K521 Toxic gastroenteritis and colitis: Secondary | ICD-10-CM | POA: Diagnosis not present

## 2020-12-26 DIAGNOSIS — E041 Nontoxic single thyroid nodule: Secondary | ICD-10-CM | POA: Insufficient documentation

## 2020-12-26 LAB — MAGNESIUM: Magnesium: 1.3 mg/dL — ABNORMAL LOW (ref 1.7–2.4)

## 2020-12-26 MED ORDER — MAGNESIUM SULFATE 4 GM/100ML IV SOLN
4.0000 g | Freq: Once | INTRAVENOUS | Status: AC
  Administered 2020-12-26: 4 g via INTRAVENOUS
  Filled 2020-12-26: qty 100

## 2020-12-26 MED ORDER — MAGNESIUM SULFATE 2 GM/50ML IV SOLN
2.0000 g | Freq: Once | INTRAVENOUS | Status: AC
  Administered 2020-12-26: 2 g via INTRAVENOUS
  Filled 2020-12-26: qty 50

## 2020-12-26 MED ORDER — HEPARIN SOD (PORK) LOCK FLUSH 100 UNIT/ML IV SOLN
INTRAVENOUS | Status: AC
  Filled 2020-12-26: qty 5

## 2020-12-26 MED ORDER — HEPARIN SOD (PORK) LOCK FLUSH 100 UNIT/ML IV SOLN
500.0000 [IU] | Freq: Once | INTRAVENOUS | Status: AC
  Administered 2020-12-26: 500 [IU] via INTRAVENOUS
  Filled 2020-12-26: qty 5

## 2020-12-26 MED ORDER — SODIUM CHLORIDE 0.9 % IV SOLN
Freq: Once | INTRAVENOUS | Status: AC
  Filled 2020-12-26: qty 250

## 2020-12-26 MED ORDER — SODIUM CHLORIDE 0.9 % IV SOLN
25.0000 mg | Freq: Once | INTRAVENOUS | Status: AC
  Administered 2020-12-26: 25 mg via INTRAVENOUS
  Filled 2020-12-26: qty 1

## 2020-12-26 MED ORDER — SODIUM CHLORIDE 0.9 % IV SOLN: 6.0000 g | Freq: Once | INTRAVENOUS | Status: DC

## 2020-12-26 MED ORDER — SODIUM CHLORIDE 0.9% FLUSH
10.0000 mL | INTRAVENOUS | Status: DC | PRN
  Administered 2020-12-26: 10 mL via INTRAVENOUS
  Filled 2020-12-26: qty 10

## 2020-12-26 NOTE — Progress Notes (Signed)
Magnesium: 1.3 today. MD, Dr. Tasia Catchings, notified and aware. Per MD order: administer Magnesium Sulfate 6g IVPB once over 3 hours today; order is present in supportive therapy plan.  1113- Patient reports Magnesium infusions cause her to become nauseous and she typically receives IV Phenergan prior to starting Magnesium infusions. MD, Dr. Tasia Catchings, notified and aware.   1124- Per MD, Dr. Tasia Catchings, order: administer Phenergan 25 mg IV once today prior to Magnesium Sulfate infusion; prn order is present in supportive therapy plan.

## 2020-12-26 NOTE — Patient Instructions (Addendum)
[facility]   Discharge Instructions: Thank you for choosing [facility] to provide your oncology and hematology care.  If you have a lab appointment with the [facility], please go directly to the [facility] and check in at the registration area.  Wear comfortable clothing and clothing appropriate for easy access to any Portacath or PICC line.   We strive to give you quality time with your provider. You may need to reschedule your appointment if you arrive late (15 or more minutes).  Arriving late affects you and other patients whose appointments are after yours.  Also, if you miss three or more appointments without notifying the office, you may be dismissed from the clinic at the provider's discretion.      For prescription refill requests, have your pharmacy contact our office and allow 72 hours for refills to be completed.    Today you received the following: Magnesium Sulfate and Phenergan.      To help prevent nausea and vomiting after your treatment, we encourage you to take your nausea medication as directed.  BELOW ARE SYMPTOMS THAT SHOULD BE REPORTED IMMEDIATELY: *FEVER GREATER THAN 100.4 F (38 C) OR HIGHER *CHILLS OR SWEATING *NAUSEA AND VOMITING THAT IS NOT CONTROLLED WITH YOUR NAUSEA MEDICATION *UNUSUAL SHORTNESS OF BREATH *UNUSUAL BRUISING OR BLEEDING *URINARY PROBLEMS (pain or burning when urinating, or frequent urination) *BOWEL PROBLEMS (unusual diarrhea, constipation, pain near the anus) TENDERNESS IN MOUTH AND THROAT WITH OR WITHOUT PRESENCE OF ULCERS (sore throat, sores in mouth, or a toothache) UNUSUAL RASH, SWELLING OR PAIN  UNUSUAL VAGINAL DISCHARGE OR ITCHING   Items with * indicate a potential emergency and should be followed up as soon as possible or go to the Emergency Department if any problems should occur.  Please show the CHEMOTHERAPY ALERT CARD or IMMUNOTHERAPY ALERT CARD at check-in to the Emergency  Department and triage nurse.  Should you have questions after your visit or need to cancel or reschedule your appointment, please contact [facility]  [phone] and follow the prompts.  Office hours are 8:00 a.m. to 4:30 p.m. Monday - Friday. Please note that voicemails left after 4:00 p.m. may not be returned until the following business day.  We are closed weekends and major holidays. You have access to a nurse at all times for urgent questions. Please call the main number to the clinic [phone] and follow the prompts.  For any non-urgent questions, you may also contact your provider using MyChart. We now offer e-Visits for anyone 77 and older to request care online for non-urgent symptoms. For details visit [email or web address].   Also download the MyChart app! Go to the app store, search "MyChart", open the app, select [facility], and log in with your MyChart username and password.  Due to Covid, a mask is required upon entering the hospital/clinic. If you do not have a mask, one will be given to you upon arrival. For doctor visits, patients may have 1 support person aged 88 or older with them. For treatment visits, patients cannot have anyone with them due to current Covid guidelines and our immunocompromised population.

## 2020-12-29 ENCOUNTER — Inpatient Hospital Stay: Payer: Medicare HMO

## 2020-12-29 ENCOUNTER — Other Ambulatory Visit: Payer: Self-pay

## 2020-12-29 ENCOUNTER — Inpatient Hospital Stay: Payer: Medicare HMO | Attending: Hematology and Oncology

## 2020-12-29 DIAGNOSIS — Z808 Family history of malignant neoplasm of other organs or systems: Secondary | ICD-10-CM | POA: Insufficient documentation

## 2020-12-29 DIAGNOSIS — Z79899 Other long term (current) drug therapy: Secondary | ICD-10-CM | POA: Diagnosis not present

## 2020-12-29 DIAGNOSIS — E1122 Type 2 diabetes mellitus with diabetic chronic kidney disease: Secondary | ICD-10-CM | POA: Insufficient documentation

## 2020-12-29 DIAGNOSIS — Z8249 Family history of ischemic heart disease and other diseases of the circulatory system: Secondary | ICD-10-CM | POA: Diagnosis not present

## 2020-12-29 DIAGNOSIS — G6289 Other specified polyneuropathies: Secondary | ICD-10-CM | POA: Insufficient documentation

## 2020-12-29 DIAGNOSIS — Z801 Family history of malignant neoplasm of trachea, bronchus and lung: Secondary | ICD-10-CM | POA: Insufficient documentation

## 2020-12-29 DIAGNOSIS — Z95828 Presence of other vascular implants and grafts: Secondary | ICD-10-CM

## 2020-12-29 DIAGNOSIS — Z803 Family history of malignant neoplasm of breast: Secondary | ICD-10-CM | POA: Diagnosis not present

## 2020-12-29 DIAGNOSIS — D509 Iron deficiency anemia, unspecified: Secondary | ICD-10-CM | POA: Diagnosis not present

## 2020-12-29 DIAGNOSIS — R771 Abnormality of globulin: Secondary | ICD-10-CM | POA: Insufficient documentation

## 2020-12-29 DIAGNOSIS — N183 Chronic kidney disease, stage 3 unspecified: Secondary | ICD-10-CM | POA: Diagnosis not present

## 2020-12-29 DIAGNOSIS — Z833 Family history of diabetes mellitus: Secondary | ICD-10-CM | POA: Insufficient documentation

## 2020-12-29 DIAGNOSIS — Z832 Family history of diseases of the blood and blood-forming organs and certain disorders involving the immune mechanism: Secondary | ICD-10-CM | POA: Insufficient documentation

## 2020-12-29 DIAGNOSIS — Z800 Family history of malignant neoplasm of digestive organs: Secondary | ICD-10-CM | POA: Diagnosis not present

## 2020-12-29 DIAGNOSIS — E860 Dehydration: Secondary | ICD-10-CM | POA: Diagnosis not present

## 2020-12-29 DIAGNOSIS — Z841 Family history of disorders of kidney and ureter: Secondary | ICD-10-CM | POA: Insufficient documentation

## 2020-12-29 DIAGNOSIS — Z5112 Encounter for antineoplastic immunotherapy: Secondary | ICD-10-CM | POA: Insufficient documentation

## 2020-12-29 DIAGNOSIS — E538 Deficiency of other specified B group vitamins: Secondary | ICD-10-CM | POA: Diagnosis not present

## 2020-12-29 DIAGNOSIS — D801 Nonfamilial hypogammaglobulinemia: Secondary | ICD-10-CM | POA: Diagnosis not present

## 2020-12-29 DIAGNOSIS — Z87891 Personal history of nicotine dependence: Secondary | ICD-10-CM | POA: Diagnosis not present

## 2020-12-29 DIAGNOSIS — Z822 Family history of deafness and hearing loss: Secondary | ICD-10-CM | POA: Insufficient documentation

## 2020-12-29 DIAGNOSIS — Z9221 Personal history of antineoplastic chemotherapy: Secondary | ICD-10-CM | POA: Diagnosis not present

## 2020-12-29 DIAGNOSIS — N289 Disorder of kidney and ureter, unspecified: Secondary | ICD-10-CM | POA: Diagnosis not present

## 2020-12-29 DIAGNOSIS — Z8379 Family history of other diseases of the digestive system: Secondary | ICD-10-CM | POA: Insufficient documentation

## 2020-12-29 DIAGNOSIS — C9000 Multiple myeloma not having achieved remission: Secondary | ICD-10-CM | POA: Diagnosis not present

## 2020-12-29 DIAGNOSIS — I129 Hypertensive chronic kidney disease with stage 1 through stage 4 chronic kidney disease, or unspecified chronic kidney disease: Secondary | ICD-10-CM | POA: Diagnosis not present

## 2020-12-29 DIAGNOSIS — K635 Polyp of colon: Secondary | ICD-10-CM | POA: Diagnosis not present

## 2020-12-29 LAB — MAGNESIUM: Magnesium: 1.3 mg/dL — ABNORMAL LOW (ref 1.7–2.4)

## 2020-12-29 MED ORDER — HEPARIN SOD (PORK) LOCK FLUSH 100 UNIT/ML IV SOLN
INTRAVENOUS | Status: AC
  Filled 2020-12-29: qty 5

## 2020-12-29 MED ORDER — SODIUM CHLORIDE 0.9 % IV SOLN: 6.0000 g | Freq: Once | INTRAVENOUS | Status: DC

## 2020-12-29 MED ORDER — SODIUM CHLORIDE 0.9 % IV SOLN
Freq: Once | INTRAVENOUS | Status: AC
Start: 2020-12-29 — End: 2020-12-29
  Filled 2020-12-29: qty 250

## 2020-12-29 MED ORDER — SODIUM CHLORIDE 0.9 % IV SOLN
25.0000 mg | Freq: Once | INTRAVENOUS | Status: AC
  Administered 2020-12-29: 25 mg via INTRAVENOUS
  Filled 2020-12-29: qty 1

## 2020-12-29 MED ORDER — SODIUM CHLORIDE 0.9% FLUSH
10.0000 mL | Freq: Once | INTRAVENOUS | Status: AC
  Administered 2020-12-29: 10 mL via INTRAVENOUS
  Filled 2020-12-29: qty 10

## 2020-12-29 MED ORDER — MAGNESIUM SULFATE 2 GM/50ML IV SOLN
2.0000 g | Freq: Once | INTRAVENOUS | Status: AC
  Administered 2020-12-29: 2 g via INTRAVENOUS
  Filled 2020-12-29: qty 50

## 2020-12-29 MED ORDER — MAGNESIUM SULFATE 4 GM/100ML IV SOLN
4.0000 g | Freq: Once | INTRAVENOUS | Status: AC
Start: 2020-12-29 — End: 2020-12-29
  Administered 2020-12-29: 4 g via INTRAVENOUS
  Filled 2020-12-29: qty 100

## 2020-12-29 MED ORDER — HEPARIN SOD (PORK) LOCK FLUSH 100 UNIT/ML IV SOLN
500.0000 [IU] | Freq: Once | INTRAVENOUS | Status: AC
Start: 2020-12-29 — End: 2020-12-29
  Administered 2020-12-29: 500 [IU] via INTRAVENOUS
  Filled 2020-12-29: qty 5

## 2021-01-02 ENCOUNTER — Inpatient Hospital Stay: Payer: Medicare HMO

## 2021-01-02 ENCOUNTER — Encounter: Payer: Self-pay | Admitting: Gastroenterology

## 2021-01-02 DIAGNOSIS — E860 Dehydration: Secondary | ICD-10-CM | POA: Diagnosis not present

## 2021-01-02 DIAGNOSIS — G6289 Other specified polyneuropathies: Secondary | ICD-10-CM | POA: Diagnosis not present

## 2021-01-02 DIAGNOSIS — Z5112 Encounter for antineoplastic immunotherapy: Secondary | ICD-10-CM | POA: Diagnosis not present

## 2021-01-02 DIAGNOSIS — D509 Iron deficiency anemia, unspecified: Secondary | ICD-10-CM | POA: Diagnosis not present

## 2021-01-02 DIAGNOSIS — C9000 Multiple myeloma not having achieved remission: Secondary | ICD-10-CM

## 2021-01-02 DIAGNOSIS — D801 Nonfamilial hypogammaglobulinemia: Secondary | ICD-10-CM | POA: Diagnosis not present

## 2021-01-02 DIAGNOSIS — E538 Deficiency of other specified B group vitamins: Secondary | ICD-10-CM | POA: Diagnosis not present

## 2021-01-02 DIAGNOSIS — N289 Disorder of kidney and ureter, unspecified: Secondary | ICD-10-CM | POA: Diagnosis not present

## 2021-01-02 DIAGNOSIS — K635 Polyp of colon: Secondary | ICD-10-CM | POA: Diagnosis not present

## 2021-01-02 LAB — CBC WITH DIFFERENTIAL/PLATELET
Abs Immature Granulocytes: 0.02 10*3/uL (ref 0.00–0.07)
Basophils Absolute: 0 10*3/uL (ref 0.0–0.1)
Basophils Relative: 0 %
Eosinophils Absolute: 0.1 10*3/uL (ref 0.0–0.5)
Eosinophils Relative: 2 %
HCT: 34 % — ABNORMAL LOW (ref 36.0–46.0)
Hemoglobin: 11 g/dL — ABNORMAL LOW (ref 12.0–15.0)
Immature Granulocytes: 0 %
Lymphocytes Relative: 17 %
Lymphs Abs: 0.9 10*3/uL (ref 0.7–4.0)
MCH: 27.2 pg (ref 26.0–34.0)
MCHC: 32.4 g/dL (ref 30.0–36.0)
MCV: 84 fL (ref 80.0–100.0)
Monocytes Absolute: 0.4 10*3/uL (ref 0.1–1.0)
Monocytes Relative: 7 %
Neutro Abs: 3.9 10*3/uL (ref 1.7–7.7)
Neutrophils Relative %: 74 %
Platelets: 107 10*3/uL — ABNORMAL LOW (ref 150–400)
RBC: 4.05 MIL/uL (ref 3.87–5.11)
RDW: 15.9 % — ABNORMAL HIGH (ref 11.5–15.5)
WBC: 5.3 10*3/uL (ref 4.0–10.5)
nRBC: 0 % (ref 0.0–0.2)

## 2021-01-02 LAB — COMPREHENSIVE METABOLIC PANEL
ALT: 27 U/L (ref 0–44)
AST: 28 U/L (ref 15–41)
Albumin: 4.1 g/dL (ref 3.5–5.0)
Alkaline Phosphatase: 88 U/L (ref 38–126)
Anion gap: 8 (ref 5–15)
BUN: 28 mg/dL — ABNORMAL HIGH (ref 8–23)
CO2: 25 mmol/L (ref 22–32)
Calcium: 9.1 mg/dL (ref 8.9–10.3)
Chloride: 105 mmol/L (ref 98–111)
Creatinine, Ser: 1.06 mg/dL — ABNORMAL HIGH (ref 0.44–1.00)
GFR, Estimated: 59 mL/min — ABNORMAL LOW (ref >60)
Glucose, Bld: 144 mg/dL — ABNORMAL HIGH (ref 70–99)
Potassium: 4.3 mmol/L (ref 3.5–5.1)
Sodium: 138 mmol/L (ref 135–145)
Total Bilirubin: 0.4 mg/dL (ref 0.3–1.2)
Total Protein: 6.6 g/dL (ref 6.5–8.1)

## 2021-01-02 LAB — MAGNESIUM: Magnesium: 1.2 mg/dL — ABNORMAL LOW (ref 1.7–2.4)

## 2021-01-02 MED ORDER — MAGNESIUM SULFATE 4 GM/100ML IV SOLN
4.0000 g | Freq: Once | INTRAVENOUS | Status: AC
  Administered 2021-01-02: 4 g via INTRAVENOUS
  Filled 2021-01-02: qty 100

## 2021-01-02 MED ORDER — HEPARIN SOD (PORK) LOCK FLUSH 100 UNIT/ML IV SOLN
500.0000 [IU] | Freq: Once | INTRAVENOUS | Status: AC
Start: 2021-01-02 — End: 2021-01-02
  Administered 2021-01-02: 500 [IU] via INTRAVENOUS
  Filled 2021-01-02: qty 5

## 2021-01-02 MED ORDER — SODIUM CHLORIDE 0.9 % IV SOLN
25.0000 mg | Freq: Once | INTRAVENOUS | Status: AC
  Administered 2021-01-02: 25 mg via INTRAVENOUS
  Filled 2021-01-02: qty 1

## 2021-01-02 MED ORDER — SODIUM CHLORIDE 0.9% FLUSH
10.0000 mL | Freq: Once | INTRAVENOUS | Status: AC
  Administered 2021-01-02: 10 mL via INTRAVENOUS
  Filled 2021-01-02: qty 10

## 2021-01-02 MED ORDER — MAGNESIUM SULFATE 2 GM/50ML IV SOLN
2.0000 g | Freq: Once | INTRAVENOUS | Status: AC
  Administered 2021-01-02: 2 g via INTRAVENOUS
  Filled 2021-01-02: qty 50

## 2021-01-02 MED ORDER — HEPARIN SOD (PORK) LOCK FLUSH 100 UNIT/ML IV SOLN
INTRAVENOUS | Status: AC
  Filled 2021-01-02: qty 5

## 2021-01-02 MED ORDER — SODIUM CHLORIDE 0.9 % IV SOLN: 6.0000 g | Freq: Once | INTRAVENOUS | Status: DC

## 2021-01-02 MED ORDER — SODIUM CHLORIDE 0.9 % IV SOLN
Freq: Once | INTRAVENOUS | Status: AC
  Filled 2021-01-02: qty 250

## 2021-01-02 NOTE — Progress Notes (Signed)
Mg 1.2. Per treatment parameters patient is to receive 6g of Magnesium. Dr. Tasia Catchings made aware of labs, dose of Magnesium, and that patient is getting ordered phenergan dose.

## 2021-01-02 NOTE — Progress Notes (Signed)
ok 

## 2021-01-05 ENCOUNTER — Inpatient Hospital Stay: Payer: Medicare HMO | Admitting: Oncology

## 2021-01-05 ENCOUNTER — Encounter: Payer: Self-pay | Admitting: Oncology

## 2021-01-05 ENCOUNTER — Inpatient Hospital Stay: Payer: Medicare HMO

## 2021-01-05 VITALS — BP 117/77 | HR 72 | Temp 97.6°F | Resp 18 | Wt 153.0 lb

## 2021-01-05 DIAGNOSIS — G6289 Other specified polyneuropathies: Secondary | ICD-10-CM | POA: Diagnosis not present

## 2021-01-05 DIAGNOSIS — Z5111 Encounter for antineoplastic chemotherapy: Secondary | ICD-10-CM | POA: Diagnosis not present

## 2021-01-05 DIAGNOSIS — G620 Drug-induced polyneuropathy: Secondary | ICD-10-CM

## 2021-01-05 DIAGNOSIS — C9000 Multiple myeloma not having achieved remission: Secondary | ICD-10-CM

## 2021-01-05 DIAGNOSIS — E860 Dehydration: Secondary | ICD-10-CM | POA: Diagnosis not present

## 2021-01-05 DIAGNOSIS — T451X5A Adverse effect of antineoplastic and immunosuppressive drugs, initial encounter: Secondary | ICD-10-CM

## 2021-01-05 DIAGNOSIS — N289 Disorder of kidney and ureter, unspecified: Secondary | ICD-10-CM | POA: Diagnosis not present

## 2021-01-05 DIAGNOSIS — K635 Polyp of colon: Secondary | ICD-10-CM | POA: Diagnosis not present

## 2021-01-05 DIAGNOSIS — E538 Deficiency of other specified B group vitamins: Secondary | ICD-10-CM

## 2021-01-05 DIAGNOSIS — D649 Anemia, unspecified: Secondary | ICD-10-CM

## 2021-01-05 DIAGNOSIS — D801 Nonfamilial hypogammaglobulinemia: Secondary | ICD-10-CM | POA: Diagnosis not present

## 2021-01-05 DIAGNOSIS — Z5112 Encounter for antineoplastic immunotherapy: Secondary | ICD-10-CM | POA: Diagnosis not present

## 2021-01-05 DIAGNOSIS — D509 Iron deficiency anemia, unspecified: Secondary | ICD-10-CM | POA: Diagnosis not present

## 2021-01-05 DIAGNOSIS — R110 Nausea: Secondary | ICD-10-CM

## 2021-01-05 LAB — COMPREHENSIVE METABOLIC PANEL
ALT: 30 U/L (ref 0–44)
AST: 33 U/L (ref 15–41)
Albumin: 4.3 g/dL (ref 3.5–5.0)
Alkaline Phosphatase: 82 U/L (ref 38–126)
Anion gap: 9 (ref 5–15)
BUN: 25 mg/dL — ABNORMAL HIGH (ref 8–23)
CO2: 24 mmol/L (ref 22–32)
Calcium: 9.2 mg/dL (ref 8.9–10.3)
Chloride: 106 mmol/L (ref 98–111)
Creatinine, Ser: 1.15 mg/dL — ABNORMAL HIGH (ref 0.44–1.00)
GFR, Estimated: 53 mL/min — ABNORMAL LOW (ref >60)
Glucose, Bld: 134 mg/dL — ABNORMAL HIGH (ref 70–99)
Potassium: 4.3 mmol/L (ref 3.5–5.1)
Sodium: 139 mmol/L (ref 135–145)
Total Bilirubin: 0.5 mg/dL (ref 0.3–1.2)
Total Protein: 6.7 g/dL (ref 6.5–8.1)

## 2021-01-05 LAB — CBC WITH DIFFERENTIAL/PLATELET
Abs Immature Granulocytes: 0.02 10*3/uL (ref 0.00–0.07)
Basophils Absolute: 0 10*3/uL (ref 0.0–0.1)
Basophils Relative: 1 %
Eosinophils Absolute: 0.1 10*3/uL (ref 0.0–0.5)
Eosinophils Relative: 3 %
HCT: 35.1 % — ABNORMAL LOW (ref 36.0–46.0)
Hemoglobin: 11.3 g/dL — ABNORMAL LOW (ref 12.0–15.0)
Immature Granulocytes: 0 %
Lymphocytes Relative: 16 %
Lymphs Abs: 0.8 10*3/uL (ref 0.7–4.0)
MCH: 27.3 pg (ref 26.0–34.0)
MCHC: 32.2 g/dL (ref 30.0–36.0)
MCV: 84.8 fL (ref 80.0–100.0)
Monocytes Absolute: 0.4 10*3/uL (ref 0.1–1.0)
Monocytes Relative: 9 %
Neutro Abs: 3.6 10*3/uL (ref 1.7–7.7)
Neutrophils Relative %: 71 %
Platelets: 112 10*3/uL — ABNORMAL LOW (ref 150–400)
RBC: 4.14 MIL/uL (ref 3.87–5.11)
RDW: 15.7 % — ABNORMAL HIGH (ref 11.5–15.5)
WBC: 5 10*3/uL (ref 4.0–10.5)
nRBC: 0 % (ref 0.0–0.2)

## 2021-01-05 LAB — MAGNESIUM: Magnesium: 1.5 mg/dL — ABNORMAL LOW (ref 1.7–2.4)

## 2021-01-05 MED ORDER — HEPARIN SOD (PORK) LOCK FLUSH 100 UNIT/ML IV SOLN
INTRAVENOUS | Status: AC
  Filled 2021-01-05: qty 5

## 2021-01-05 MED ORDER — SODIUM CHLORIDE 0.9 % IV SOLN
25.0000 mg | Freq: Once | INTRAVENOUS | Status: AC
  Administered 2021-01-05: 25 mg via INTRAVENOUS
  Filled 2021-01-05: qty 1

## 2021-01-05 MED ORDER — SODIUM CHLORIDE 0.9 % IV SOLN
Freq: Once | INTRAVENOUS | Status: AC
  Filled 2021-01-05: qty 250

## 2021-01-05 MED ORDER — MAGNESIUM SULFATE 2 GM/50ML IV SOLN
2.0000 g | Freq: Once | INTRAVENOUS | Status: AC
  Administered 2021-01-05: 2 g via INTRAVENOUS
  Filled 2021-01-05: qty 50

## 2021-01-05 MED ORDER — BORTEZOMIB CHEMO SQ INJECTION 3.5 MG (2.5MG/ML)
2.0000 mg | Freq: Once | INTRAMUSCULAR | Status: AC
  Administered 2021-01-05: 2 mg via SUBCUTANEOUS
  Filled 2021-01-05: qty 0.8

## 2021-01-05 MED ORDER — SODIUM CHLORIDE 0.9% FLUSH
10.0000 mL | Freq: Once | INTRAVENOUS | Status: AC
  Administered 2021-01-05: 10 mL via INTRAVENOUS
  Filled 2021-01-05: qty 10

## 2021-01-05 MED ORDER — HEPARIN SOD (PORK) LOCK FLUSH 100 UNIT/ML IV SOLN
500.0000 [IU] | Freq: Once | INTRAVENOUS | Status: AC | PRN
  Administered 2021-01-05: 500 [IU]
  Filled 2021-01-05: qty 5

## 2021-01-05 NOTE — Patient Instructions (Signed)
[facility]  Discharge Instructions: Thank you for choosing [facility] to provide your oncology and hematology care.  If you have a lab appointment with the [facility], please go directly to the [facility] and check in at the registration area.  Wear comfortable clothing and clothing appropriate for easy access to any Portacath or PICC line.   We strive to give you quality time with your provider. You may need to reschedule your appointment if you arrive late (15 or more minutes).  Arriving late affects you and other patients whose appointments are after yours.  Also, if you miss three or more appointments without notifying the office, you may be dismissed from the clinic at the provider's discretion.      For prescription refill requests, have your pharmacy contact our office and allow 72 hours for refills to be completed.    Today you received the following chemotherapy and/or immunotherapy agents:Velcade, Magnesium, Magnesium Sulfate Injection What is this medication? MAGNESIUM SULFATE (mag NEE zee um SUL fate) prevents and treats low levels of magnesium in your body. It may also be used to prevent and treat seizures during pregnancy in people with high blood pressure disorders, such as preeclampsia or eclampsia. Magnesium plays an important role in maintaining thehealth of your muscles and nervous system. This medicine may be used for other purposes; ask your health care provider orpharmacist if you have questions. What should I tell my care team before I take this medication? They need to know if you have any of these conditions: Heart disease History of irregular heart beat Kidney disease An unusual or allergic reaction to magnesium sulfate, medications, foods, dyes, or preservatives Pregnant or trying to get pregnant Breast-feeding How should I use this medication? This medication is for infusion into a vein. It is given in a  hospital orclinic setting. Talk to your care team about the use of this medication in children. While thismedication may be prescribed for selected conditions, precautions do apply. Overdosage: If you think you have taken too much of this medicine contact apoison control center or emergency room at once. NOTE: This medicine is only for you. Do not share this medicine with others. What if I miss a dose? This does not apply. What may interact with this medication? Certain medications for anxiety or sleep Certain medications for seizures like phenobarbital Digoxin Medications that relax muscles for surgery Narcotic medications for pain This list may not describe all possible interactions. Give your health care provider a list of all the medicines, herbs, non-prescription drugs, or dietary supplements you use. Also tell them if you smoke, drink alcohol, or use illegaldrugs. Some items may interact with your medicine. What should I watch for while using this medication? Your condition will be monitored carefully while you are receiving this medication. You may need blood work done while you are receiving thismedication. What side effects may I notice from receiving this medication? Side effects that you should report to your care team as soon as possible: Allergic reactions-skin rash, itching, hives, swelling of the face, lips, tongue, or throat High magnesium level-confusion, drowsiness, facial flushing, redness, sweating, muscle weakness, fast or irregular heartbeat, trouble breathing Low blood pressure-dizziness, feeling faint or lightheaded, blurry vision Side effects that usually do not require medical attention (report to your careteam if they continue or are bothersome): Headache Nausea This list may not describe all possible side effects. Call your doctor for medical advice about side effects. You may  report side effects to FDA at1-800-FDA-1088. Where should I keep my medication? This  medication is given in a hospital or clinic and will not be stored at home. NOTE: This sheet is a summary. It may not cover all possible information. If you have questions about this medicine, talk to your doctor, pharmacist, orhealth care provider.  2022 Elsevier/Gold Standard (2020-08-07 13:10:26) Bortezomib injection What is this medication? BORTEZOMIB (bor TEZ oh mib) targets proteins in cancer cells and stops thecancer cells from growing. It treats multiple myeloma and mantle cell lymphoma. This medicine may be used for other purposes; ask your health care provider orpharmacist if you have questions. COMMON BRAND NAME(S): Velcade What should I tell my care team before I take this medication? They need to know if you have any of these conditions: dehydration diabetes (high blood sugar) heart disease liver disease tingling of the fingers or toes or other nerve disorder an unusual or allergic reaction to bortezomib, mannitol, boron, other medicines, foods, dyes, or preservatives pregnant or trying to get pregnant breast-feeding How should I use this medication? This medicine is injected into a vein or under the skin. It is given by ahealth care provider in a hospital or clinic setting. Talk to your health care provider about the use of this medicine in children.Special care may be needed. Overdosage: If you think you have taken too much of this medicine contact apoison control center or emergency room at once. NOTE: This medicine is only for you. Do not share this medicine with others. What if I miss a dose? Keep appointments for follow-up doses. It is important not to miss your dose.Call your health care provider if you are unable to keep an appointment. What may interact with this medication? This medicine may interact with the following medications: ketoconazole rifampin This list may not describe all possible interactions. Give your health care provider a list of all the  medicines, herbs, non-prescription drugs, or dietary supplements you use. Also tell them if you smoke, drink alcohol, or use illegaldrugs. Some items may interact with your medicine. What should I watch for while using this medication? Your condition will be monitored carefully while you are receiving thismedicine. You may need blood work done while you are taking this medicine. You may get drowsy or dizzy. Do not drive, use machinery, or do anything that needs mental alertness until you know how this medicine affects you. Do not stand up or sit up quickly, especially if you are an older patient. Thisreduces the risk of dizzy or fainting spells This medicine may increase your risk of getting an infection. Call your health care provider for advice if you get a fever, chills, sore throat, or other symptoms of a cold or flu. Do not treat yourself. Try to avoid being aroundpeople who are sick. Check with your health care provider if you have severe diarrhea, nausea, and vomiting, or if you sweat a lot. The loss of too much body fluid may make itdangerous for you to take this medicine. Do not become pregnant while taking this medicine or for 7 months after stopping it. Women should inform their health care provider if they wish to become pregnant or think they might be pregnant. Men should not father a child while taking this medicine and for 4 months after stopping it. There is a potential for serious harm to an unborn child. Talk to your health care provider for more information. Do not breast-feed an infant while taking thismedicine or for 2 months  after stopping it. This medicine may make it more difficult to get pregnant or father a child.Talk to your health care provider if you are concerned about your fertility. What side effects may I notice from receiving this medication? Side effects that you should report to your doctor or health care professionalas soon as possible: allergic reactions (skin rash;  itching or hives; swelling of the face, lips, or tongue) bleeding (bloody or black, tarry stools; red or dark brown urine; spitting up blood or brown material that looks like coffee grounds; red spots on the skin; unusual bruising or bleeding from the eye, gums, or nose) blurred vision or changes in vision confusion constipation headache heart failure (trouble breathing; fast, irregular heartbeat; sudden weight gain; swelling of the ankles, feet, hands) infection (fever, chills, cough, sore throat, pain or trouble passing urine) lack or loss of appetite liver injury (dark yellow or brown urine; general ill feeling or flu-like symptoms; loss of appetite, right upper belly pain; yellowing of the eyes or skin) low blood pressure (dizziness; feeling faint or lightheaded, falls; unusually weak or tired) muscle cramps pain, redness, or irritation at site where injected pain, tingling, numbness in the hands or feet seizures trouble breathing unusual bruising or bleeding Side effects that usually do not require medical attention (report to yourdoctor or health care professional if they continue or are bothersome): diarrhea nausea stomach pain trouble sleeping vomiting This list may not describe all possible side effects. Call your doctor for medical advice about side effects. You may report side effects to FDA at1-800-FDA-1088. Where should I keep my medication? This medicine is given in a hospital or clinic. It will not be stored at home. NOTE: This sheet is a summary. It may not cover all possible information. If you have questions about this medicine, talk to your doctor, pharmacist, orhealth care provider.  2022 Elsevier/Gold Standard (2020-06-08 13:22:53)    To help prevent nausea and vomiting after your treatment, we encourage you to take your nausea medication as directed.  BELOW ARE SYMPTOMS THAT SHOULD BE REPORTED IMMEDIATELY: *FEVER GREATER THAN 100.4 F (38 C) OR HIGHER *CHILLS  OR SWEATING *NAUSEA AND VOMITING THAT IS NOT CONTROLLED WITH YOUR NAUSEA MEDICATION *UNUSUAL SHORTNESS OF BREATH *UNUSUAL BRUISING OR BLEEDING *URINARY PROBLEMS (pain or burning when urinating, or frequent urination) *BOWEL PROBLEMS (unusual diarrhea, constipation, pain near the anus) TENDERNESS IN MOUTH AND THROAT WITH OR WITHOUT PRESENCE OF ULCERS (sore throat, sores in mouth, or a toothache) UNUSUAL RASH, SWELLING OR PAIN  UNUSUAL VAGINAL DISCHARGE OR ITCHING   Items with * indicate a potential emergency and should be followed up as soon as possible or go to the Emergency Department if any problems should occur.  Please show the CHEMOTHERAPY ALERT CARD or IMMUNOTHERAPY ALERT CARD at check-in to the Emergency Department and triage nurse.  Should you have questions after your visit or need to cancel or reschedule your appointment, please contact [facility]  [phone] and follow the prompts.  Office hours are 8:00 a.m. to 4:30 p.m. Monday - Friday. Please note that voicemails left after 4:00 p.m. may not be returned until the following business day.  We are closed weekends and major holidays. You have access to a nurse at all times for urgent questions. Please call the main number to the clinic [phone] and follow the prompts.  For any non-urgent questions, you may also contact your provider using MyChart. We now offer e-Visits for anyone 47 and older to request  care online for non-urgent symptoms. For details visit [email or web address].   Also download the MyChart app! Go to the app store, search "MyChart", open the app, select , and log in with your MyChart username and password.  Due to Covid, a mask is required upon entering the hospital/clinic. If you do not have a mask, one will be given to you upon arrival. For doctor visits, patients may have 1 support person aged 24 or older with them. For treatment visits, patients cannot have  anyone with them due to current Covid guidelines and our immunocompromised population.

## 2021-01-05 NOTE — Progress Notes (Signed)
Pt in for follow up reports pulled a muscle in back lifting dog this week.

## 2021-01-05 NOTE — Progress Notes (Signed)
Hematology Oncology Progress Note  Clinic Day:  01/05/2021   Referring physician: Glean Hess, MD  Chief Complaint: Sarah [name] is a 65 y.o. 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   Initial bone marrow revealed 80% plasma cells.  Lambda free light chains were 1340.  She had nephrotic range proteinuria.  She initially underwent induction with RVD.  Revlimid maintenance was discontinued on 01/21/2017 secondary to intolerance.     07/19/2019 Bone marrow aspirate and biopsy on  revealed a normocellular marrow with but increased lambda-restricted plasma cells (9% aspirate, 40% CD138 immunohistochemistry).  Findings were consistent with recurrent plasma cell myeloma.  Flow cytometry revealed no monoclonal B-cell or phenotypically aberrant T-cell population. Cytogenetics were 55, XX (normal).  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    Lambda light chains have been followed: 22.2 (ratio 0.56) on 07/03/2017, 30.8 (ratio 0.78) on 09/02/2017, 36.9 (ratio 0.40) on 10/21/2017, 37.4 (ratio 0.41) on 12/16/2017, 70.7(ratio 0.31)  on 02/17/2018, 64.2 (ratio 0.27) on 04/07/2018, 78.9 (ratio 0.18) on 05/26/2018, 128.8 (ratio 0.17) on 08/06/2018, 181.5 (ratio 0.13) on 10/08/2018, 130.9 (ratio 0.13) on 10/20/2018, 160.7 (ratio 0.10) on 12/09/2018, 236.6 (ratio 0.07) on 02/01/2019, 363.6 (ratio  0.04) on 03/22/2019, 404.8 (ratio 0.04) on 04/05/2019, 420.7 (ratio 0.03) on 05/24/2019, 573.4 (ratio 0.03) on 06/23/2019, 451.05 (ratio 0.02) on 08/20/2019, 47.2 (ratio 0.13) on 10/25/2019, 22.4 (ratio 0.21) on 11/25/2019, 16.5 (ratio 0.33) on 01/10/2020, 14.6 (ratio 0.34) on 02/07/2020, 13.1 (ratio 0.31) on 03/07/2020, 10.1 (ratio 0.38) on 04/10/2020, and 9.5 (ratio 0.21) on 06/19/2020.   24 hour UPEP on 06/03/2019 revealed kappa free light chains 95.76, lambda free light chains 1,260.71, and ratio 0.08.  24 hour UPEP on 08/23/2019 revealed total protein of 782 mg/24 hrs with lambda free light chains 1,084.16 mg/L and ratio of 0.10 (1.03-31.76).  M spike in urine was 46.1% (361 mg/24 hrs).    Bone survey on 04/08/2016 and 05/28/2017 revealed no definite lytic lesion seen in the visualized skeleton.  Bone survey on 11/19/2018 revealed no suspicious lucent lesions and no acute bony abnormality.  PET scan on 07/12/2019 revealed no focal metabolic activity to suggest active myeloma within the skeleton. There were no lytic lesions identified on the CT portion of the exam or soft tissue plasmacytomas. There was no evidence of multiple myeloma.    Pretreatment RBC phenotype on 09/23/2019 was positive for C, e, DUFFY B, KIDD B, M, S, and s antigen; negative for c, E, KELL, DUFFY A, KIDD A, and N antigen.    09/27/2019 - 10/25/2019; 12/09/2019 - 03/13/2020 6 cycles of daratumumab and hyaluronidase-fihj, Pomalyst, and Decadron (DPd) .  Cycle #1 was complicated by fever and neutropenia requiring admission.  Cycle #2 was complicated by pneumonia requiring admission.  Cycle #6 was complicated with an ER evaluation for an elevated lactic acid.   05/10/2020 second autologous stem cell transplant at [facility]   She underwent conditioning with melphalan 140 mg/m2.  She is s/p week #3 Velcade maintenance (began 08/31/2020 - 09/28/2020).  She has no increase in neuropathy.  She has severe aortic stenosis.  Echo on  05/21/2020 revealed severe aortic valve stenosis (tricuspid valve with 2 leaflets fused) and an EF of 45-50%. Cardiology is following for a possible TAVR in the future.  Echo on 07/05/2020 revealed moderate to severe aortic stenosis with an EF of 50-55%.  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 Zometa was discontinued in 01/2017.  She has chronic nausea on Phenergan.     B12 deficiency.  B12 was 254 on 04/09/2017, 295 on 08/20/2018, and 391 on 10/08/2018.  She was on oral B12.  She received B12 monthly (last 09/14/2020).  Folate was 12.1 on 01/10/2020.    She has iron deficiency.  Ferritin was 32 on 07/01/2019.  She received Venofer on 07/15/2019 and 07/22/2019.   She has hypogammaglobulinemia.  IgG was 245 on 11/25/2019.  She received monthly IVIG (07/22/021 - 03/22/2020).  She received IVIG 400 mg/kg on 01/20/2020, 200 mg/kg on 02/17/2020, and 300 mg/kg on 03/22/2020.  IVIG on 24/03/7352 was complicated by acute renal failure. IgG trough level was 418 on 02/17/2020.  10/15/2019 - 10/20/2019 She was admitted to [facility] with fever and neutropenia.  Cultures were negative.  CXR was negative. She received broad spectrum antibiotics and daily Granix.  She received IVF for acute renal insufficiency due to diarrhea and dehydration.  Creatine was 1.66 on admission and 1.12 on discharge.    03/01/2020 - 03/05/2020 admitted to [facility] from with fever and neutropenia. CXR revealed no active cardiopulmonary disease. Chest CT with contrast revealed no acute intrathoracic pathology. There were findings which could be suggestive of prior granulomatous disease. She was treated with Cefepime and Vancomycin, then switched to ciprofloxacin on 03/03/2020.   05/08/2020 - 12/05/2021The patient was admitted to [facility] from  for autologous bone marrow transplant.   She received conditioning with melphalan 140 mg/m2.  Bone marrow transplant was on 05/10/2020. The patient developed fevers daily from  05/19/2020 - 05/24/2020. Blood cultures were + for strept sanguis bacteremia.  She was treated cefepime, vancomycin, and solumedrol for possible engraftment syndrome. Cefepime was switched to ceftriaxone; she completed 2 weeks of ceftriaxone on 06/03/2020. She had chemotherapy induced diarrhea.  She experienced urinary retention.  Feb 2022 patient has been on maintenance Velcade 2 mg every 2 weeks.  Chronic diarrhea seen by gastroenterology Dr. Vicente Males and was recommended to avoid artificial sugars in her diet including Diet Coke and coffee mate.  She feels some improvement of her diarrhea frequency since the dietary modification.  GI profile PCR negative, C. difficile toxin A+ B negative, fecal calprotectin 77 12/19/2020 colonoscopy showed 2 subcentimeter polyps in the ascending colon, resected and removed.  Pathology showed tubular adenoma.  No malignancy.  Normal mucosa in the entire examined colon.  Biopsied, pathology showed colonic mucosa with no significant pathological alteration.  Negative for active inflammation and features of chronicity.  Negative for microscopic colitis, dysplasia, and malignanc  diabetes, patient has discontinued metformin due to diarrhea and started on glipizide 2.5 mg  INTERVAL HISTORY Sarah [name] is a 65 y.o. female who has above history reviewed by me today presents for follow up visit for management of multiple myeloma Problems and complaints are listed below:  Chronic diarrhea and patient takes Lomotil.2 tablets  every 4 hours as needed.  chronic neuropathy, symptoms are stable.   Patient has chronic magnesium, gets 4 to 6 g  of IV magnesium twice weekly with each visit.   She reports pulled a back muscle after lifting her log this week.   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   Due to Zometa   Cardiomyopathy, idiopathic ([place]) 08/21/2017   Overview:  Septal and apical hypokinesis with ef 40%   CHF (congestive heart failure) (HCC)    CKD (chronic kidney disease) stage 3, GFR 30-59 ml/min (HCC)    Depression    Diabetes mellitus (HCC)    Dizziness    Fatty liver    Frequent falls    GERD (gastroesophageal reflux disease)    Gout    Heart murmur    History of blood transfusion    History of bone marrow transplant ([place])    History of uterine fibroid    Hx of cardiac catheterization 06/05/2016   Overview:  Normal coronaries 2017   Hypertension    Hypomagnesemia    IDA (iron deficiency anemia)    Multiple myeloma ([place])    Personal history of chemotherapy    Renal cyst    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[place]) 06/05/2016   Overview:  Sp ablation 1999    Past Surgical History:  Procedure Laterality Date   ABDOMINAL HYSTERECTOMY     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   CARDIAC ELECTROPHYSIOLOGY MAPPING AND ABLATION     CARPAL TUNNEL RELEASE Bilateral    CHOLECYSTECTOMY  2008   COLONOSCOPY WITH PROPOFOL N/A 05/07/2017   Procedure: COLONOSCOPY WITH PROPOFOL;  Surgeon: [name], MD;  Location: [facility] ENDOSCOPY;  Service: Gastroenterology;  Laterality: N/A;   COLONOSCOPY WITH PROPOFOL N/A 12/19/2020   Procedure: COLONOSCOPY WITH PROPOFOL;  Surgeon: [name], MD;  Location: [facility] ENDOSCOPY;  Service: Gastroenterology;  Laterality: N/A;   ESOPHAGOGASTRODUODENOSCOPY (EGD) WITH PROPOFOL N/A 05/07/2017   Procedure: ESOPHAGOGASTRODUODENOSCOPY (EGD) WITH PROPOFOL;  Surgeon: [name], MD;  Location: [facility] ENDOSCOPY;  Service: Gastroenterology;  Laterality: N/A;   FOOT SURGERY Bilateral    INCONTINENCE SURGERY  2009   INTERSTIM IMPLANT PLACEMENT     other     over active bladder   OTHER SURGICAL HISTORY     bladder stimulator    PARTIAL HYSTERECTOMY  03/1996   fibroids   PORTA CATH INSERTION N/A 03/10/2019    Procedure: PORTA CATH INSERTION;  Surgeon: Algernon Huxley, MD;  Location: [facility];  Service: Cardiovascular;  Laterality: N/A;   TONSILLECTOMY  2007    Family History  Problem Relation Age of Onset   Colon cancer Father    Renal Disease Father    Diabetes Mellitus II Father    Melanoma Paternal Grandmother    Breast cancer Maternal Aunt 40   Anemia Mother    Heart disease Mother    Heart failure Mother    Renal Disease Mother    Congestive Heart Failure Mother    Heart disease Maternal Uncle    Throat cancer Maternal Uncle    Lung cancer Maternal Uncle    Liver disease Maternal Uncle    Heart failure Maternal Uncle    Hearing loss Son 70       Suicide     Social History:  reports that she quit smoking about 29 years ago. Her smoking use  included cigarettes. She has a 20.00 pack-year smoking history. She has never used smokeless tobacco. She reports current alcohol use. She reports that she does not use drugs.  She is on disability. She notes exposure to perchloroethylene [facility]).  She lives much of her adult life in [place].   Her husband passed away.    Allergies:  Allergies  Allergen Reactions   Oxycodone-Acetaminophen Anaphylaxis    Swelling and rash   Celebrex [Celecoxib] Diarrhea   Codeine    Plerixafor     In 2016 during ASCT collection patient developed fever to 103.28F and required hospitalization   Benadryl [Diphenhydramine] Palpitations   Morphine Itching and Rash   Ondansetron Diarrhea   Tylenol [Acetaminophen] Itching and Rash    Current Medications: Current Outpatient Medications  Medication Sig Dispense Refill   acetaminophen (TYLENOL) 500 MG tablet Take 500 mg by mouth every 6 (six) hours as needed.     acyclovir (ZOVIRAX) 400 MG tablet Take 400 mg by mouth 2 (two) times daily.     allopurinol (ZYLOPRIM) 100 MG tablet TAKE 1 TABLET(100 MG) BY MOUTH DAILY as needed 90 tablet 1   bisoprolol (ZEBETA) 5 MG tablet Take 5 mg by mouth  daily.     diclofenac sodium (VOLTAREN) 1 % GEL Apply 2 g topically 4 (four) times daily. 100 g 1   diphenhydrAMINE (BENADRYL) 50 MG capsule Take 50 mg by mouth as needed.     diphenoxylate-atropine (LOMOTIL) 2.5-0.025 MG tablet Take 2 tablets by mouth 4 (four) times daily as needed for diarrhea or loose stools. 90 tablet 0   FLUoxetine (PROZAC) 40 MG capsule TAKE 1 CAPSULE EVERY DAY 90 capsule 1   fluticasone (FLONASE) 50 MCG/ACT nasal spray Place 2 sprays into both nostrils daily. 16 g 2   glucose blood test strip      loperamide (IMODIUM) 2 MG capsule Take 1 capsule (2 mg total) by mouth 4 (four) times daily as needed for diarrhea or loose stools. 120 capsule 3   methocarbamol (ROBAXIN) 500 MG tablet TAKE 1 TABLET FOUR TIMES DAILY. 120 tablet 0   Multiple Vitamin (MULTIVITAMIN) tablet Take 1 tablet by mouth daily.     Multiple Vitamins-Minerals (HAIR/SKIN/NAILS) TABS Take 1 tablet by mouth daily.     omeprazole (PRILOSEC) 40 MG capsule Take 1 capsule (40 mg total) by mouth in the morning and at bedtime. 180 capsule 1   promethazine (PHENERGAN) 25 MG tablet Take 1 tablet (25 mg total) by mouth every 6 (six) hours as needed for nausea or vomiting. 90 tablet 0   promethazine-dextromethorphan (PROMETHAZINE-DM) 6.25-15 MG/5ML syrup Take 5 mLs by mouth 4 (four) times daily as needed for cough. 118 mL 0   traZODone (DESYREL) 100 MG tablet Take 1 tablet (100 mg total) by mouth at bedtime. 90 tablet 1   lactulose (CHRONULAC) 10 GM/15ML solution Take 20 g by mouth daily as needed. (Patient not taking: No sig reported)     lidocaine (LIDODERM) 5 % 1 patch daily. (Patient not taking: No sig reported)     No current facility-administered medications for this visit.   Facility-Administered Medications Ordered in Other Visits  Medication Dose Route Frequency Provider Last Rate Last Admin   0.9 %  sodium chloride infusion   Intravenous Continuous Lequita Asal, MD   Stopped at 01/20/20 1232   0.9 %   sodium chloride infusion   Intravenous Continuous Lequita Asal, MD   Stopped at 12/19/20 0856   0.9 %  sodium chloride infusion   Intravenous Continuous Earlie Server, MD 10 mL/hr at 11/30/20 0957 New Bag at 11/30/20 0957   heparin lock flush 100 unit/mL  500 Units Intravenous Once Faythe Casa E, NP       heparin lock flush 100 unit/mL  500 Units Intravenous Once Corcoran, Melissa C, MD       sodium chloride flush (NS) 0.9 % injection 10 mL  10 mL Intravenous PRN Nolon Stalls C, MD   10 mL at 02/07/20 0815   sodium chloride flush (NS) 0.9 % injection 10 mL  10 mL Intracatheter PRN Cammie Sickle, MD   10 mL at 02/10/20 0815   sodium chloride flush (NS) 0.9 % injection 10 mL  10 mL Intravenous PRN Jacquelin Hawking, NP   10 mL at 03/01/20 1143    Review of Systems  Constitutional:  Negative for chills, fever, malaise/fatigue and weight loss.  HENT:  Negative for sore throat.   Eyes:  Negative for redness.  Respiratory:  Negative for cough, shortness of breath and wheezing.   Cardiovascular:  Negative for chest pain, palpitations and leg swelling.  Gastrointestinal:  Positive for diarrhea and nausea. Negative for abdominal pain, blood in stool and vomiting.  Genitourinary:  Negative for dysuria.  Musculoskeletal:  Positive for back pain and joint pain. Negative for myalgias.  Skin:  Negative for rash.  Neurological:  Positive for tingling and sensory change. Negative for dizziness and tremors.  Endo/Heme/Allergies:  Does not bruise/bleed easily.  Psychiatric/Behavioral:  Negative for hallucinations.    Performance status (ECOG): 1  Vitals Blood pressure 117/77, pulse 72, temperature 97.6 F (36.4 C), temperature source Tympanic, resp. rate 18, weight 153 lb (69.4 kg), SpO2 97 %.  Physical Exam Vitals and nursing note reviewed.  Constitutional:      General: She is not in acute distress.    Appearance: She is well-developed. She is not ill-appearing, toxic-appearing or  diaphoretic.     Interventions: Face mask in place.  HENT:     Head: Normocephalic and atraumatic.     Mouth/Throat:     Mouth: Mucous membranes are moist.     Pharynx: Oropharynx is clear.  Eyes:     General: No scleral icterus.    Pupils: Pupils are equal, round, and reactive to light.  Cardiovascular:     Rate and Rhythm: Normal rate and regular rhythm.     Heart sounds: Normal heart sounds. No murmur heard. Pulmonary:     Effort: Pulmonary effort is normal. No respiratory distress.     Breath sounds: Normal breath sounds. No wheezing or rales.  Chest:     Chest wall: No tenderness.  Breasts:    Right: No supraclavicular adenopathy.     Left: No supraclavicular adenopathy.  Abdominal:     General: Bowel sounds are normal. There is no distension.     Palpations: Abdomen is soft. There is no hepatomegaly, splenomegaly or mass.     Tenderness: There is no abdominal tenderness. There is no guarding or rebound.  Musculoskeletal:        General: Swelling (right knee, subtle fullness) present. Tenderness: BLE, medial right knee.Normal range of motion.     Cervical back: Normal range of motion and neck supple.     Right lower leg: No edema.     Left lower leg: No edema.  Lymphadenopathy:     Head:     Right side of head: No preauricular, posterior auricular or occipital adenopathy.  Left side of head: No preauricular, posterior auricular or occipital adenopathy.     Cervical: No cervical adenopathy.     Upper Body:     Right upper body: No supraclavicular adenopathy.     Left upper body: No supraclavicular adenopathy.  Skin:    General: Skin is warm and dry.     Coloration: Skin is not jaundiced.  Neurological:     Mental Status: She is alert and oriented to person, place, and time. Mental status is at baseline.  Psychiatric:        Mood and Affect: Mood normal.   Laboratory data Infusion on 01/05/2021  Component Date Value Ref Range Status   Magnesium 01/05/2021 1.5  (A) 1.7 - 2.4 mg/dL Final   Performed at [facility], [place], [place] [address]   Sodium 01/05/2021 139  135 - 145 mmol/L Final   Potassium 01/05/2021 4.3  3.5 - 5.1 mmol/L Final   Chloride 01/05/2021 106  98 - 111 mmol/L Final   CO2 01/05/2021 24  22 - 32 mmol/L Final   Glucose, Bld 01/05/2021 134 (A) 70 - 99 mg/dL Final   Glucose reference range applies only to samples taken after fasting for at least 8 hours.   BUN 01/05/2021 25 (A) 8 - 23 mg/dL Final   Creatinine, Ser 01/05/2021 1.15 (A) 0.44 - 1.00 mg/dL Final   Calcium 01/05/2021 9.2  8.9 - 10.3 mg/dL Final   Total Protein 01/05/2021 6.7  6.5 - 8.1 g/dL Final   Albumin 01/05/2021 4.3  3.5 - 5.0 g/dL Final   AST 01/05/2021 33  15 - 41 U/L Final   ALT 01/05/2021 30  0 - 44 U/L Final   Alkaline Phosphatase 01/05/2021 82  38 - 126 U/L Final   Total Bilirubin 01/05/2021 0.5  0.3 - 1.2 mg/dL Final   GFR, Estimated 01/05/2021 53 (A) >60 mL/min Final   Comment: (NOTE) Calculated using the CKD-EPI Creatinine Equation (2021)    Anion gap 01/05/2021 9  5 - 15 Final   Performed at [facility], [place], [place] [address]   WBC 01/05/2021 5.0  4.0 - 10.5 K/uL Final   RBC 01/05/2021 4.14  3.87 - 5.11 MIL/uL Final   Hemoglobin 01/05/2021 11.3 (A) 12.0 - 15.0 g/dL Final   HCT 01/05/2021 35.1 (A) 36.0 - 46.0 % Final   MCV 01/05/2021 84.8  80.0 - 100.0 fL Final   MCH 01/05/2021 27.3  26.0 - 34.0 pg Final   MCHC 01/05/2021 32.2  30.0 - 36.0 g/dL Final   RDW 01/05/2021 15.7 (A) 11.5 - 15.5 % Final   Platelets 01/05/2021 112 (A) 150 - 400 K/uL Final   SPECIMEN CHECKED FOR CLOTS   nRBC 01/05/2021 0.0  0.0 - 0.2 % Final   Neutrophils Relative % 01/05/2021 71  % Final   Neutro Abs 01/05/2021 3.6  1.7 - 7.7 K/uL Final   Lymphocytes Relative 01/05/2021 16  % Final   Lymphs Abs 01/05/2021 0.8  0.7 - 4.0 K/uL Final   Monocytes Relative 01/05/2021 9  % Final   Monocytes Absolute 01/05/2021 0.4  0.1 -  1.0 K/uL Final   Eosinophils Relative 01/05/2021 3  % Final   Eosinophils Absolute 01/05/2021 0.1  0.0 - 0.5 K/uL Final   Basophils Relative 01/05/2021 1  % Final   Basophils Absolute 01/05/2021 0.0  0.0 - 0.1 K/uL Final   Immature Granulocytes 01/05/2021 0  % Final   Abs  Immature Granulocytes 01/05/2021 0.02  0.00 - 0.07 K/uL Final   Performed at [facility], [address], [place], [place] [address]    Assessment/Plan 1. Encounter for antineoplastic chemotherapy   2. Multiple myeloma not having achieved remission ([place])   3. 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  She has been on Velcade maintenance every 2 weeks.   Insurance finally approved subcutaneous Velcade.  Labs are reviewed and discussed with patient. Proceed with Velcade SQ 64m today.  12/14/2020 kappa/light chain ratio trends up to 1.09, still within normal limits.  Close monitor.. Continue prophylaxis with Valtrex up to 1 year after transplant-November 2022 [facility]ination at [facility] [phone].  #Hyperglobulinemia post stem cell transplantation.  Currently she has no frequent infection, will hold off IVIG replacement therapy.  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  Patient prefers to be monitored clinically rather than starting neuropathy medication.   # iron deficiency anemia, Hemoglobin is stable at 11.5,  11/16/2020 iron panel showed increased iron saturation to 15.   #chronic diarrhea, Gastroenterology recommendation was reviewed.  Avoid artificial sugar.  Colonoscopy showed no microscopic colitis. Her symptom seems to have improved slightly after dietary modification. Velcade side effects may also contribute to diarrhea  #Chronic hypomagnesemia Magnesium is 1.5 today, proceed with 2 g  of IV magnesium today. Off Slow-Mag 64 mg twice daily.  Per GI recommendation as this potentially precipitate her diarrhea. We will schedule patient to have magnesium checked twice a week +/- IV magnesium  #Chronic nausea, patient request to have IV Phenergan in the clinic with treatment.  She has antiemetics at home.  # B12 deficiency, patient has been on vitamin B12 monthly. Last dose 11/09/2020.   RTC: 1 week, lab magnesium/IV magnesium twice per week,  RTC in 2 weeks for labs (CBC with diff, CMP, Mg ), md  for  Velcade and IV Mg.  I discussed the assessment and treatment plan with the patient.  The patient was provided an opportunity to ask questions and all were answered.  The patient agreed with the plan and demonstrated an understanding of the instructions.  The patient was advised to call back if the symptoms worsen or if the condition fails to improve as anticipated.   ZEarlie Server MD, PhD Hematology Oncology CLesterat [facility]7/01/2021

## 2021-01-08 ENCOUNTER — Other Ambulatory Visit: Payer: Self-pay

## 2021-01-08 LAB — KAPPA/LAMBDA LIGHT CHAINS
Kappa free light chain: 11.9 mg/L (ref 3.3–19.4)
Kappa, lambda light chain ratio: 1.14 (ref 0.26–1.65)
Lambda free light chains: 10.4 mg/L (ref 5.7–26.3)

## 2021-01-09 ENCOUNTER — Inpatient Hospital Stay: Payer: Medicare HMO

## 2021-01-09 ENCOUNTER — Other Ambulatory Visit: Payer: Self-pay

## 2021-01-09 DIAGNOSIS — C9000 Multiple myeloma not having achieved remission: Secondary | ICD-10-CM | POA: Diagnosis not present

## 2021-01-09 DIAGNOSIS — E538 Deficiency of other specified B group vitamins: Secondary | ICD-10-CM | POA: Diagnosis not present

## 2021-01-09 DIAGNOSIS — D509 Iron deficiency anemia, unspecified: Secondary | ICD-10-CM | POA: Diagnosis not present

## 2021-01-09 DIAGNOSIS — N289 Disorder of kidney and ureter, unspecified: Secondary | ICD-10-CM | POA: Diagnosis not present

## 2021-01-09 DIAGNOSIS — K635 Polyp of colon: Secondary | ICD-10-CM | POA: Diagnosis not present

## 2021-01-09 DIAGNOSIS — G6289 Other specified polyneuropathies: Secondary | ICD-10-CM | POA: Diagnosis not present

## 2021-01-09 DIAGNOSIS — D801 Nonfamilial hypogammaglobulinemia: Secondary | ICD-10-CM | POA: Diagnosis not present

## 2021-01-09 DIAGNOSIS — Z5112 Encounter for antineoplastic immunotherapy: Secondary | ICD-10-CM | POA: Diagnosis not present

## 2021-01-09 DIAGNOSIS — E860 Dehydration: Secondary | ICD-10-CM | POA: Diagnosis not present

## 2021-01-09 LAB — MULTIPLE MYELOMA PANEL, SERUM
Albumin SerPl Elph-Mcnc: 3.9 g/dL (ref 2.9–4.4)
Albumin/Glob SerPl: 1.8 — ABNORMAL HIGH (ref 0.7–1.7)
Alpha 1: 0.2 g/dL (ref 0.0–0.4)
Alpha2 Glob SerPl Elph-Mcnc: 0.9 g/dL (ref 0.4–1.0)
B-Globulin SerPl Elph-Mcnc: 0.9 g/dL (ref 0.7–1.3)
Gamma Glob SerPl Elph-Mcnc: 0.3 g/dL — ABNORMAL LOW (ref 0.4–1.8)
Globulin, Total: 2.2 g/dL (ref 2.2–3.9)
IgA: 26 mg/dL — ABNORMAL LOW (ref 87–352)
IgG (Immunoglobin G), Serum: 287 mg/dL — ABNORMAL LOW (ref 586–1602)
IgM (Immunoglobulin M), Srm: 28 mg/dL (ref 26–217)
Total Protein ELP: 6.1 g/dL (ref 6.0–8.5)

## 2021-01-09 LAB — MAGNESIUM: Magnesium: 1.3 mg/dL — ABNORMAL LOW (ref 1.7–2.4)

## 2021-01-09 MED ORDER — SODIUM CHLORIDE 0.9 % IV SOLN
25.0000 mg | Freq: Once | INTRAVENOUS | Status: AC
  Administered 2021-01-09: 25 mg via INTRAVENOUS
  Filled 2021-01-09: qty 1

## 2021-01-09 MED ORDER — HEPARIN SOD (PORK) LOCK FLUSH 100 UNIT/ML IV SOLN
500.0000 [IU] | Freq: Once | INTRAVENOUS | Status: AC | PRN
  Administered 2021-01-09: 500 [IU]
  Filled 2021-01-09: qty 5

## 2021-01-09 MED ORDER — MAGNESIUM SULFATE 2 GM/50ML IV SOLN
2.0000 g | Freq: Once | INTRAVENOUS | Status: AC
Start: 2021-01-09 — End: 2021-01-09
  Administered 2021-01-09: 2 g via INTRAVENOUS
  Filled 2021-01-09: qty 50

## 2021-01-09 MED ORDER — SODIUM CHLORIDE 0.9 % IV SOLN
Freq: Once | INTRAVENOUS | Status: AC
  Filled 2021-01-09: qty 250

## 2021-01-09 MED ORDER — SODIUM CHLORIDE 0.9 % IV SOLN
6.0000 g | Freq: Once | INTRAVENOUS | Status: DC
  Filled 2021-01-09: qty 12

## 2021-01-09 MED ORDER — HEPARIN SOD (PORK) LOCK FLUSH 100 UNIT/ML IV SOLN
INTRAVENOUS | Status: AC
  Filled 2021-01-09: qty 5

## 2021-01-09 MED ORDER — MAGNESIUM SULFATE 4 GM/100ML IV SOLN
4.0000 g | Freq: Once | INTRAVENOUS | Status: AC
  Administered 2021-01-09: 4 g via INTRAVENOUS
  Filled 2021-01-09: qty 100

## 2021-01-11 ENCOUNTER — Ambulatory Visit: Payer: Medicare HMO

## 2021-01-12 ENCOUNTER — Inpatient Hospital Stay: Payer: Medicare HMO

## 2021-01-12 DIAGNOSIS — K635 Polyp of colon: Secondary | ICD-10-CM | POA: Diagnosis not present

## 2021-01-12 DIAGNOSIS — Z5112 Encounter for antineoplastic immunotherapy: Secondary | ICD-10-CM | POA: Diagnosis not present

## 2021-01-12 DIAGNOSIS — G6289 Other specified polyneuropathies: Secondary | ICD-10-CM | POA: Diagnosis not present

## 2021-01-12 DIAGNOSIS — E538 Deficiency of other specified B group vitamins: Secondary | ICD-10-CM

## 2021-01-12 DIAGNOSIS — D509 Iron deficiency anemia, unspecified: Secondary | ICD-10-CM | POA: Diagnosis not present

## 2021-01-12 DIAGNOSIS — N289 Disorder of kidney and ureter, unspecified: Secondary | ICD-10-CM | POA: Diagnosis not present

## 2021-01-12 DIAGNOSIS — C9000 Multiple myeloma not having achieved remission: Secondary | ICD-10-CM | POA: Diagnosis not present

## 2021-01-12 DIAGNOSIS — E860 Dehydration: Secondary | ICD-10-CM | POA: Diagnosis not present

## 2021-01-12 DIAGNOSIS — D801 Nonfamilial hypogammaglobulinemia: Secondary | ICD-10-CM | POA: Diagnosis not present

## 2021-01-12 LAB — MAGNESIUM: Magnesium: 1.5 mg/dL — ABNORMAL LOW (ref 1.7–2.4)

## 2021-01-12 LAB — VITAMIN B12: Vitamin B-12: 431 pg/mL (ref 180–914)

## 2021-01-12 MED ORDER — MAGNESIUM SULFATE 2 GM/50ML IV SOLN
2.0000 g | Freq: Once | INTRAVENOUS | Status: AC
  Administered 2021-01-12: 2 g via INTRAVENOUS
  Filled 2021-01-12: qty 50

## 2021-01-12 MED ORDER — SODIUM CHLORIDE 0.9% FLUSH
10.0000 mL | Freq: Once | INTRAVENOUS | Status: AC
  Administered 2021-01-12: 10 mL via INTRAVENOUS
  Filled 2021-01-12: qty 10

## 2021-01-12 MED ORDER — HEPARIN SOD (PORK) LOCK FLUSH 100 UNIT/ML IV SOLN
500.0000 [IU] | Freq: Once | INTRAVENOUS | Status: AC
  Administered 2021-01-12: 500 [IU] via INTRAVENOUS
  Filled 2021-01-12: qty 5

## 2021-01-12 MED ORDER — HEPARIN SOD (PORK) LOCK FLUSH 100 UNIT/ML IV SOLN
INTRAVENOUS | Status: AC
  Filled 2021-01-12: qty 5

## 2021-01-12 MED ORDER — SODIUM CHLORIDE 0.9% FLUSH
10.0000 mL | INTRAVENOUS | Status: DC | PRN
  Filled 2021-01-12: qty 10

## 2021-01-12 NOTE — Patient Instructions (Signed)
Hypomagnesemia Hypomagnesemia is a condition in which the level of magnesium in the blood is low. Magnesium is a mineral that is found in many foods. It is used in many different processes in the body. Hypomagnesemia can affect every organ in thebody. In severe cases, it can cause life-threatening problems. What are the causes? This condition may be caused by: Not getting enough magnesium in your diet. Malnutrition. Problems with absorbing magnesium from the intestines. Dehydration. Alcohol abuse. Vomiting. Severe or chronic diarrhea. Some medicines, including medicines that make you urinate more (diuretics). Certain diseases, such as kidney disease, diabetes, celiac disease, and overactive thyroid. What are the signs or symptoms? Symptoms of this condition include: Loss of appetite. Nausea and vomiting. Involuntary shaking or trembling of a body part (tremor). Muscle weakness. Tingling in the arms and legs. Sudden tightening of muscles (muscle spasms). Confusion. Psychiatric issues, such as depression, irritability, or psychosis. A feeling of fluttering of the heart. Seizures. These symptoms are more severe if magnesium levels drop suddenly. How is this diagnosed? This condition may be diagnosed based on: Your symptoms and medical history. A physical exam. Blood and urine tests. How is this treated? Treatment depends on the cause and the severity of the condition. It may be treated with: A magnesium supplement. This can be taken in pill form. If the condition is severe, magnesium is usually given through an IV. Changes to your diet. You may be directed to eat foods that have a lot of magnesium, such as green leafy vegetables, peas, beans, and nuts. Stopping any intake of alcohol. Follow these instructions at home:     Make sure that your diet includes foods with magnesium. Foods that have a lot of magnesium in them include: Green leafy vegetables, such as spinach and  broccoli. Beans and peas. Nuts and seeds, such as almonds and sunflower seeds. Whole grains, such as whole grain bread and fortified cereals. Take magnesium supplements if your health care provider tells you to do that. Take them as directed. Take over-the-counter and prescription medicines only as told by your health care provider. Have your magnesium levels monitored as told by your health care provider. When you are active, drink fluids that contain electrolytes. Avoid drinking alcohol. Keep all follow-up visits as told by your health care provider. This is important. Contact a health care provider if: You get worse instead of better. Your symptoms return. Get help right away if you: Develop severe muscle weakness. Have trouble breathing. Feel that your heart is racing. Summary Hypomagnesemia is a condition in which the level of magnesium in the blood is low. Hypomagnesemia can affect every organ in the body. Treatment may include eating more foods that contain magnesium, taking magnesium supplements, and not drinking alcohol. Have your magnesium levels monitored as told by your health care provider. This information is not intended to replace advice given to you by your health care provider. Make sure you discuss any questions you have with your healthcare provider. Document Revised: 11/18/2019 Document Reviewed: 11/18/2019 Elsevier Patient Education  2022 Elsevier Inc.  

## 2021-01-16 ENCOUNTER — Other Ambulatory Visit: Payer: Self-pay

## 2021-01-16 ENCOUNTER — Inpatient Hospital Stay: Payer: Medicare HMO | Attending: Oncology

## 2021-01-16 ENCOUNTER — Inpatient Hospital Stay: Payer: Medicare HMO

## 2021-01-16 DIAGNOSIS — C9000 Multiple myeloma not having achieved remission: Secondary | ICD-10-CM

## 2021-01-16 DIAGNOSIS — Z841 Family history of disorders of kidney and ureter: Secondary | ICD-10-CM | POA: Insufficient documentation

## 2021-01-16 DIAGNOSIS — Z9484 Stem cells transplant status: Secondary | ICD-10-CM | POA: Insufficient documentation

## 2021-01-16 DIAGNOSIS — L989 Disorder of the skin and subcutaneous tissue, unspecified: Secondary | ICD-10-CM | POA: Diagnosis not present

## 2021-01-16 DIAGNOSIS — D801 Nonfamilial hypogammaglobulinemia: Secondary | ICD-10-CM | POA: Insufficient documentation

## 2021-01-16 DIAGNOSIS — Z87891 Personal history of nicotine dependence: Secondary | ICD-10-CM | POA: Insufficient documentation

## 2021-01-16 DIAGNOSIS — T451X5A Adverse effect of antineoplastic and immunosuppressive drugs, initial encounter: Secondary | ICD-10-CM | POA: Insufficient documentation

## 2021-01-16 DIAGNOSIS — K219 Gastro-esophageal reflux disease without esophagitis: Secondary | ICD-10-CM | POA: Insufficient documentation

## 2021-01-16 DIAGNOSIS — Z8379 Family history of other diseases of the digestive system: Secondary | ICD-10-CM | POA: Insufficient documentation

## 2021-01-16 DIAGNOSIS — D709 Neutropenia, unspecified: Secondary | ICD-10-CM | POA: Diagnosis not present

## 2021-01-16 DIAGNOSIS — Z808 Family history of malignant neoplasm of other organs or systems: Secondary | ICD-10-CM | POA: Insufficient documentation

## 2021-01-16 DIAGNOSIS — N183 Chronic kidney disease, stage 3 unspecified: Secondary | ICD-10-CM | POA: Insufficient documentation

## 2021-01-16 DIAGNOSIS — R1310 Dysphagia, unspecified: Secondary | ICD-10-CM | POA: Diagnosis not present

## 2021-01-16 DIAGNOSIS — F32A Depression, unspecified: Secondary | ICD-10-CM | POA: Diagnosis not present

## 2021-01-16 DIAGNOSIS — Z79899 Other long term (current) drug therapy: Secondary | ICD-10-CM | POA: Diagnosis not present

## 2021-01-16 DIAGNOSIS — E538 Deficiency of other specified B group vitamins: Secondary | ICD-10-CM | POA: Insufficient documentation

## 2021-01-16 DIAGNOSIS — Z886 Allergy status to analgesic agent status: Secondary | ICD-10-CM | POA: Insufficient documentation

## 2021-01-16 DIAGNOSIS — M549 Dorsalgia, unspecified: Secondary | ICD-10-CM | POA: Diagnosis not present

## 2021-01-16 DIAGNOSIS — Z9049 Acquired absence of other specified parts of digestive tract: Secondary | ICD-10-CM | POA: Insufficient documentation

## 2021-01-16 DIAGNOSIS — Z803 Family history of malignant neoplasm of breast: Secondary | ICD-10-CM | POA: Insufficient documentation

## 2021-01-16 DIAGNOSIS — Z885 Allergy status to narcotic agent status: Secondary | ICD-10-CM | POA: Insufficient documentation

## 2021-01-16 DIAGNOSIS — R110 Nausea: Secondary | ICD-10-CM | POA: Diagnosis not present

## 2021-01-16 DIAGNOSIS — Z801 Family history of malignant neoplasm of trachea, bronchus and lung: Secondary | ICD-10-CM | POA: Insufficient documentation

## 2021-01-16 DIAGNOSIS — M2550 Pain in unspecified joint: Secondary | ICD-10-CM | POA: Diagnosis not present

## 2021-01-16 DIAGNOSIS — I350 Nonrheumatic aortic (valve) stenosis: Secondary | ICD-10-CM | POA: Insufficient documentation

## 2021-01-16 DIAGNOSIS — R197 Diarrhea, unspecified: Secondary | ICD-10-CM | POA: Insufficient documentation

## 2021-01-16 DIAGNOSIS — Z840 Family history of diseases of the skin and subcutaneous tissue: Secondary | ICD-10-CM | POA: Insufficient documentation

## 2021-01-16 DIAGNOSIS — I129 Hypertensive chronic kidney disease with stage 1 through stage 4 chronic kidney disease, or unspecified chronic kidney disease: Secondary | ICD-10-CM | POA: Diagnosis not present

## 2021-01-16 DIAGNOSIS — G8929 Other chronic pain: Secondary | ICD-10-CM | POA: Insufficient documentation

## 2021-01-16 DIAGNOSIS — C9001 Multiple myeloma in remission: Secondary | ICD-10-CM | POA: Diagnosis not present

## 2021-01-16 DIAGNOSIS — Z832 Family history of diseases of the blood and blood-forming organs and certain disorders involving the immune mechanism: Secondary | ICD-10-CM | POA: Insufficient documentation

## 2021-01-16 DIAGNOSIS — Z800 Family history of malignant neoplasm of digestive organs: Secondary | ICD-10-CM | POA: Insufficient documentation

## 2021-01-16 DIAGNOSIS — Z822 Family history of deafness and hearing loss: Secondary | ICD-10-CM | POA: Insufficient documentation

## 2021-01-16 DIAGNOSIS — E1122 Type 2 diabetes mellitus with diabetic chronic kidney disease: Secondary | ICD-10-CM | POA: Insufficient documentation

## 2021-01-16 DIAGNOSIS — Z9481 Bone marrow transplant status: Secondary | ICD-10-CM | POA: Insufficient documentation

## 2021-01-16 DIAGNOSIS — Z888 Allergy status to other drugs, medicaments and biological substances status: Secondary | ICD-10-CM | POA: Diagnosis not present

## 2021-01-16 DIAGNOSIS — Z8249 Family history of ischemic heart disease and other diseases of the circulatory system: Secondary | ICD-10-CM | POA: Insufficient documentation

## 2021-01-16 DIAGNOSIS — Z833 Family history of diabetes mellitus: Secondary | ICD-10-CM | POA: Insufficient documentation

## 2021-01-16 LAB — CBC WITH DIFFERENTIAL/PLATELET
Abs Immature Granulocytes: 0.03 10*3/uL (ref 0.00–0.07)
Basophils Absolute: 0 10*3/uL (ref 0.0–0.1)
Basophils Relative: 0 %
Eosinophils Absolute: 0.2 10*3/uL (ref 0.0–0.5)
Eosinophils Relative: 4 %
HCT: 35.2 % — ABNORMAL LOW (ref 36.0–46.0)
Hemoglobin: 11.5 g/dL — ABNORMAL LOW (ref 12.0–15.0)
Immature Granulocytes: 1 %
Lymphocytes Relative: 18 %
Lymphs Abs: 1 10*3/uL (ref 0.7–4.0)
MCH: 27.3 pg (ref 26.0–34.0)
MCHC: 32.7 g/dL (ref 30.0–36.0)
MCV: 83.6 fL (ref 80.0–100.0)
Monocytes Absolute: 0.4 10*3/uL (ref 0.1–1.0)
Monocytes Relative: 7 %
Neutro Abs: 4 10*3/uL (ref 1.7–7.7)
Neutrophils Relative %: 70 %
Platelets: 118 10*3/uL — ABNORMAL LOW (ref 150–400)
RBC: 4.21 MIL/uL (ref 3.87–5.11)
RDW: 15 % (ref 11.5–15.5)
WBC: 5.6 10*3/uL (ref 4.0–10.5)
nRBC: 0 % (ref 0.0–0.2)

## 2021-01-16 LAB — COMPREHENSIVE METABOLIC PANEL
ALT: 33 U/L (ref 0–44)
AST: 32 U/L (ref 15–41)
Albumin: 4.4 g/dL (ref 3.5–5.0)
Alkaline Phosphatase: 86 U/L (ref 38–126)
Anion gap: 11 (ref 5–15)
BUN: 20 mg/dL (ref 8–23)
CO2: 19 mmol/L — ABNORMAL LOW (ref 22–32)
Calcium: 9.1 mg/dL (ref 8.9–10.3)
Chloride: 105 mmol/L (ref 98–111)
Creatinine, Ser: 1.17 mg/dL — ABNORMAL HIGH (ref 0.44–1.00)
GFR, Estimated: 52 mL/min — ABNORMAL LOW (ref >60)
Glucose, Bld: 150 mg/dL — ABNORMAL HIGH (ref 70–99)
Potassium: 4.4 mmol/L (ref 3.5–5.1)
Sodium: 135 mmol/L (ref 135–145)
Total Bilirubin: 0.3 mg/dL (ref 0.3–1.2)
Total Protein: 7 g/dL (ref 6.5–8.1)

## 2021-01-16 LAB — MAGNESIUM: Magnesium: 1.3 mg/dL — ABNORMAL LOW (ref 1.7–2.4)

## 2021-01-16 MED ORDER — MAGNESIUM SULFATE 4 GM/100ML IV SOLN
4.0000 g | Freq: Once | INTRAVENOUS | Status: AC
  Administered 2021-01-16: 4 g via INTRAVENOUS
  Filled 2021-01-16: qty 100

## 2021-01-16 MED ORDER — SODIUM CHLORIDE 0.9% FLUSH
10.0000 mL | Freq: Once | INTRAVENOUS | Status: AC
  Administered 2021-01-16: 10 mL via INTRAVENOUS
  Filled 2021-01-16: qty 10

## 2021-01-16 MED ORDER — HEPARIN SOD (PORK) LOCK FLUSH 100 UNIT/ML IV SOLN
500.0000 [IU] | Freq: Once | INTRAVENOUS | Status: AC
  Administered 2021-01-16: 500 [IU] via INTRAVENOUS
  Filled 2021-01-16: qty 5

## 2021-01-16 MED ORDER — SODIUM CHLORIDE 0.9 % IV SOLN: 6.0000 g | Freq: Once | INTRAVENOUS | Status: DC

## 2021-01-16 MED ORDER — HEPARIN SOD (PORK) LOCK FLUSH 100 UNIT/ML IV SOLN
INTRAVENOUS | Status: AC
  Filled 2021-01-16: qty 5

## 2021-01-16 MED ORDER — MAGNESIUM SULFATE 2 GM/50ML IV SOLN
2.0000 g | Freq: Once | INTRAVENOUS | Status: AC
  Administered 2021-01-16: 2 g via INTRAVENOUS
  Filled 2021-01-16: qty 50

## 2021-01-16 MED ORDER — SODIUM CHLORIDE 0.9 % IV SOLN
Freq: Once | INTRAVENOUS | Status: AC
Start: 2021-01-16 — End: 2021-01-16
  Filled 2021-01-16: qty 250

## 2021-01-16 NOTE — Patient Instructions (Signed)
[facility]  Discharge Instructions: Thank you for choosing [facility] to provide your oncology and hematology care.  If you have a lab appointment with the [facility], please go directly to the [facility] and check in at the registration area.  Wear comfortable clothing and clothing appropriate for easy access to any Portacath or PICC line.   We strive to give you quality time with your provider. You may need to reschedule your appointment if you arrive late (15 or more minutes).  Arriving late affects you and other patients whose appointments are after yours.  Also, if you miss three or more appointments without notifying the office, you may be dismissed from the clinic at the provider's discretion.      For prescription refill requests, have your pharmacy contact our office and allow 72 hours for refills to be completed.    Today you received the following chemotherapy and/or immunotherapy agents Magnesium      To help prevent nausea and vomiting after your treatment, we encourage you to take your nausea medication as directed.  BELOW ARE SYMPTOMS THAT SHOULD BE REPORTED IMMEDIATELY: *FEVER GREATER THAN 100.4 F (38 C) OR HIGHER *CHILLS OR SWEATING *NAUSEA AND VOMITING THAT IS NOT CONTROLLED WITH YOUR NAUSEA MEDICATION *UNUSUAL SHORTNESS OF BREATH *UNUSUAL BRUISING OR BLEEDING *URINARY PROBLEMS (pain or burning when urinating, or frequent urination) *BOWEL PROBLEMS (unusual diarrhea, constipation, pain near the anus) TENDERNESS IN MOUTH AND THROAT WITH OR WITHOUT PRESENCE OF ULCERS (sore throat, sores in mouth, or a toothache) UNUSUAL RASH, SWELLING OR PAIN  UNUSUAL VAGINAL DISCHARGE OR ITCHING   Items with * indicate a potential emergency and should be followed up as soon as possible or go to the Emergency Department if any problems should occur.  Please show the CHEMOTHERAPY ALERT CARD or IMMUNOTHERAPY ALERT CARD at check-in  to the Emergency Department and triage nurse.  Should you have questions after your visit or need to cancel or reschedule your appointment, please contact [facility]  [phone] and follow the prompts.  Office hours are 8:00 a.m. to 4:30 p.m. Monday - Friday. Please note that voicemails left after 4:00 p.m. may not be returned until the following business day.  We are closed weekends and major holidays. You have access to a nurse at all times for urgent questions. Please call the main number to the clinic [phone] and follow the prompts.  For any non-urgent questions, you may also contact your provider using MyChart. We now offer e-Visits for anyone 74 and older to request care online for non-urgent symptoms. For details visit [email or web address].   Also download the MyChart app! Go to the app store, search "MyChart", open the app, select [facility], and log in with your MyChart username and password.  Due to Covid, a mask is required upon entering the hospital/clinic. If you do not have a mask, one will be given to you upon arrival. For doctor visits, patients may have 1 support person aged 49 or older with them. For treatment visits, patients cannot have anyone with them due to current Covid guidelines and our immunocompromised population.

## 2021-01-19 ENCOUNTER — Inpatient Hospital Stay: Payer: Medicare HMO

## 2021-01-19 ENCOUNTER — Encounter: Payer: Self-pay | Admitting: Oncology

## 2021-01-19 ENCOUNTER — Inpatient Hospital Stay (HOSPITAL_BASED_OUTPATIENT_CLINIC_OR_DEPARTMENT_OTHER): Payer: Medicare HMO | Admitting: Oncology

## 2021-01-19 VITALS — BP 158/71 | HR 65 | Temp 96.9°F | Resp 16 | Wt 154.2 lb

## 2021-01-19 DIAGNOSIS — G620 Drug-induced polyneuropathy: Secondary | ICD-10-CM

## 2021-01-19 DIAGNOSIS — G6289 Other specified polyneuropathies: Secondary | ICD-10-CM | POA: Diagnosis not present

## 2021-01-19 DIAGNOSIS — C9000 Multiple myeloma not having achieved remission: Secondary | ICD-10-CM

## 2021-01-19 DIAGNOSIS — D509 Iron deficiency anemia, unspecified: Secondary | ICD-10-CM | POA: Diagnosis not present

## 2021-01-19 DIAGNOSIS — D709 Neutropenia, unspecified: Secondary | ICD-10-CM | POA: Diagnosis not present

## 2021-01-19 DIAGNOSIS — G8929 Other chronic pain: Secondary | ICD-10-CM | POA: Diagnosis not present

## 2021-01-19 DIAGNOSIS — C9001 Multiple myeloma in remission: Secondary | ICD-10-CM | POA: Diagnosis not present

## 2021-01-19 DIAGNOSIS — R197 Diarrhea, unspecified: Secondary | ICD-10-CM

## 2021-01-19 DIAGNOSIS — K635 Polyp of colon: Secondary | ICD-10-CM | POA: Diagnosis not present

## 2021-01-19 DIAGNOSIS — T451X5A Adverse effect of antineoplastic and immunosuppressive drugs, initial encounter: Secondary | ICD-10-CM

## 2021-01-19 DIAGNOSIS — Z5111 Encounter for antineoplastic chemotherapy: Secondary | ICD-10-CM

## 2021-01-19 DIAGNOSIS — D801 Nonfamilial hypogammaglobulinemia: Secondary | ICD-10-CM | POA: Diagnosis not present

## 2021-01-19 DIAGNOSIS — E538 Deficiency of other specified B group vitamins: Secondary | ICD-10-CM | POA: Diagnosis not present

## 2021-01-19 DIAGNOSIS — Z9484 Stem cells transplant status: Secondary | ICD-10-CM | POA: Diagnosis not present

## 2021-01-19 DIAGNOSIS — E860 Dehydration: Secondary | ICD-10-CM | POA: Diagnosis not present

## 2021-01-19 DIAGNOSIS — R110 Nausea: Secondary | ICD-10-CM | POA: Diagnosis not present

## 2021-01-19 DIAGNOSIS — N289 Disorder of kidney and ureter, unspecified: Secondary | ICD-10-CM | POA: Diagnosis not present

## 2021-01-19 DIAGNOSIS — I350 Nonrheumatic aortic (valve) stenosis: Secondary | ICD-10-CM | POA: Diagnosis not present

## 2021-01-19 DIAGNOSIS — Z5112 Encounter for antineoplastic immunotherapy: Secondary | ICD-10-CM | POA: Diagnosis not present

## 2021-01-19 DIAGNOSIS — K219 Gastro-esophageal reflux disease without esophagitis: Secondary | ICD-10-CM | POA: Diagnosis not present

## 2021-01-19 LAB — CBC WITH DIFFERENTIAL/PLATELET
Abs Immature Granulocytes: 0.05 10*3/uL (ref 0.00–0.07)
Basophils Absolute: 0 10*3/uL (ref 0.0–0.1)
Basophils Relative: 0 %
Eosinophils Absolute: 0.2 10*3/uL (ref 0.0–0.5)
Eosinophils Relative: 5 %
HCT: 34.1 % — ABNORMAL LOW (ref 36.0–46.0)
Hemoglobin: 11.2 g/dL — ABNORMAL LOW (ref 12.0–15.0)
Immature Granulocytes: 1 %
Lymphocytes Relative: 20 %
Lymphs Abs: 1 10*3/uL (ref 0.7–4.0)
MCH: 27.4 pg (ref 26.0–34.0)
MCHC: 32.8 g/dL (ref 30.0–36.0)
MCV: 83.4 fL (ref 80.0–100.0)
Monocytes Absolute: 0.4 10*3/uL (ref 0.1–1.0)
Monocytes Relative: 8 %
Neutro Abs: 3.3 10*3/uL (ref 1.7–7.7)
Neutrophils Relative %: 66 %
Platelets: 109 10*3/uL — ABNORMAL LOW (ref 150–400)
RBC: 4.09 MIL/uL (ref 3.87–5.11)
RDW: 14.8 % (ref 11.5–15.5)
WBC: 4.9 10*3/uL (ref 4.0–10.5)
nRBC: 0 % (ref 0.0–0.2)

## 2021-01-19 LAB — COMPREHENSIVE METABOLIC PANEL
ALT: 31 U/L (ref 0–44)
AST: 32 U/L (ref 15–41)
Albumin: 4.2 g/dL (ref 3.5–5.0)
Alkaline Phosphatase: 92 U/L (ref 38–126)
Anion gap: 9 (ref 5–15)
BUN: 19 mg/dL (ref 8–23)
CO2: 23 mmol/L (ref 22–32)
Calcium: 9 mg/dL (ref 8.9–10.3)
Chloride: 106 mmol/L (ref 98–111)
Creatinine, Ser: 0.98 mg/dL (ref 0.44–1.00)
GFR, Estimated: >60 mL/min (ref >60)
Glucose, Bld: 144 mg/dL — ABNORMAL HIGH (ref 70–99)
Potassium: 4.7 mmol/L (ref 3.5–5.1)
Sodium: 138 mmol/L (ref 135–145)
Total Bilirubin: 0.2 mg/dL — ABNORMAL LOW (ref 0.3–1.2)
Total Protein: 6.8 g/dL (ref 6.5–8.1)

## 2021-01-19 LAB — MAGNESIUM: Magnesium: 1.6 mg/dL — ABNORMAL LOW (ref 1.7–2.4)

## 2021-01-19 MED ORDER — BORTEZOMIB CHEMO SQ INJECTION 3.5 MG (2.5MG/ML)
2.0000 mg | Freq: Once | INTRAMUSCULAR | Status: AC
  Administered 2021-01-19: 2 mg via SUBCUTANEOUS
  Filled 2021-01-19: qty 0.8

## 2021-01-19 MED ORDER — MAGNESIUM SULFATE 2 GM/50ML IV SOLN
2.0000 g | Freq: Once | INTRAVENOUS | Status: AC
  Administered 2021-01-19: 2 g via INTRAVENOUS
  Filled 2021-01-19: qty 50

## 2021-01-19 MED ORDER — HEPARIN SOD (PORK) LOCK FLUSH 100 UNIT/ML IV SOLN
500.0000 [IU] | Freq: Once | INTRAVENOUS | Status: DC
  Filled 2021-01-19: qty 5

## 2021-01-19 MED ORDER — SODIUM CHLORIDE 0.9 % IV SOLN
25.0000 mg | Freq: Once | INTRAVENOUS | Status: AC
  Administered 2021-01-19: 25 mg via INTRAVENOUS
  Filled 2021-01-19: qty 1

## 2021-01-19 MED ORDER — HEPARIN SOD (PORK) LOCK FLUSH 100 UNIT/ML IV SOLN
500.0000 [IU] | Freq: Once | INTRAVENOUS | Status: AC | PRN
  Administered 2021-01-19: 500 [IU]
  Filled 2021-01-19: qty 5

## 2021-01-19 MED ORDER — DIPHENOXYLATE-ATROPINE 2.5-0.025 MG PO TABS: 2.0000 | ORAL_TABLET | Freq: Four times a day (QID) | ORAL | 1 refills | Status: DC | PRN

## 2021-01-19 MED ORDER — SODIUM CHLORIDE 0.9% FLUSH
10.0000 mL | INTRAVENOUS | Status: DC | PRN
  Administered 2021-01-19: 10 mL via INTRAVENOUS
  Filled 2021-01-19: qty 10

## 2021-01-19 MED ORDER — HEPARIN SOD (PORK) LOCK FLUSH 100 UNIT/ML IV SOLN
INTRAVENOUS | Status: AC
  Filled 2021-01-19: qty 5

## 2021-01-19 MED ORDER — SODIUM CHLORIDE 0.9 % IV SOLN
Freq: Once | INTRAVENOUS | Status: AC
  Filled 2021-01-19: qty 250

## 2021-01-19 NOTE — Progress Notes (Signed)
Patient has a place on lower right leg she would like MD to check.  Having epigastric pain and her medication gets stuck.

## 2021-01-19 NOTE — Patient Instructions (Signed)
[facility]  Discharge Instructions: Thank you for choosing [facility] to provide your oncology and hematology care.  If you have a lab appointment with the [facility], please go directly to the [facility] and check in at the registration area.  Wear comfortable clothing and clothing appropriate for easy access to any Portacath or PICC line.   We strive to give you quality time with your provider. You may need to reschedule your appointment if you arrive late (15 or more minutes).  Arriving late affects you and other patients whose appointments are after yours.  Also, if you miss three or more appointments without notifying the office, you may be dismissed from the clinic at the provider's discretion.      For prescription refill requests, have your pharmacy contact our office and allow 72 hours for refills to be completed.    Today you received the following chemotherapy and/or immunotherapy agents Velcade      To help prevent nausea and vomiting after your treatment, we encourage you to take your nausea medication as directed.  BELOW ARE SYMPTOMS THAT SHOULD BE REPORTED IMMEDIATELY: *FEVER GREATER THAN 100.4 F (38 C) OR HIGHER *CHILLS OR SWEATING *NAUSEA AND VOMITING THAT IS NOT CONTROLLED WITH YOUR NAUSEA MEDICATION *UNUSUAL SHORTNESS OF BREATH *UNUSUAL BRUISING OR BLEEDING *URINARY PROBLEMS (pain or burning when urinating, or frequent urination) *BOWEL PROBLEMS (unusual diarrhea, constipation, pain near the anus) TENDERNESS IN MOUTH AND THROAT WITH OR WITHOUT PRESENCE OF ULCERS (sore throat, sores in mouth, or a toothache) UNUSUAL RASH, SWELLING OR PAIN  UNUSUAL VAGINAL DISCHARGE OR ITCHING   Items with * indicate a potential emergency and should be followed up as soon as possible or go to the Emergency Department if any problems should occur.  Please show the CHEMOTHERAPY ALERT CARD or IMMUNOTHERAPY ALERT CARD at check-in to  the Emergency Department and triage nurse.  Should you have questions after your visit or need to cancel or reschedule your appointment, please contact [facility]  [phone] and follow the prompts.  Office hours are 8:00 a.m. to 4:30 p.m. Monday - Friday. Please note that voicemails left after 4:00 p.m. may not be returned until the following business day.  We are closed weekends and major holidays. You have access to a nurse at all times for urgent questions. Please call the main number to the clinic [phone] and follow the prompts.  For any non-urgent questions, you may also contact your provider using MyChart. We now offer e-Visits for anyone 17 and older to request care online for non-urgent symptoms. For details visit [email or web address].   Also download the MyChart app! Go to the app store, search "MyChart", open the app, select [facility], and log in with your MyChart username and password.  Due to Covid, a mask is required upon entering the hospital/clinic. If you do not have a mask, one will be given to you upon arrival. For doctor visits, patients may have 1 support person aged 22 or older with them. For treatment visits, patients cannot have anyone with them due to current Covid guidelines and our immunocompromised population.    Hypomagnesemia Hypomagnesemia is a condition in which the level of magnesium in the blood is low. Magnesium is a mineral that is found in many foods. It is used in many different processes in the body. Hypomagnesemia can affect every organ in thebody. In severe cases, it can cause life-threatening problems. What are the  causes? This condition may be caused by: Not getting enough magnesium in your diet. Malnutrition. Problems with absorbing magnesium from the intestines. Dehydration. Alcohol abuse. Vomiting. Severe or chronic diarrhea. Some medicines, including medicines that make you urinate more  (diuretics). Certain diseases, such as kidney disease, diabetes, celiac disease, and overactive thyroid. What are the signs or symptoms? Symptoms of this condition include: Loss of appetite. Nausea and vomiting. Involuntary shaking or trembling of a body part (tremor). Muscle weakness. Tingling in the arms and legs. Sudden tightening of muscles (muscle spasms). Confusion. Psychiatric issues, such as depression, irritability, or psychosis. A feeling of fluttering of the heart. Seizures. These symptoms are more severe if magnesium levels drop suddenly. How is this diagnosed? This condition may be diagnosed based on: Your symptoms and medical history. A physical exam. Blood and urine tests. How is this treated? Treatment depends on the cause and the severity of the condition. It may be treated with: A magnesium supplement. This can be taken in pill form. If the condition is severe, magnesium is usually given through an IV. Changes to your diet. You may be directed to eat foods that have a lot of magnesium, such as green leafy vegetables, peas, beans, and nuts. Stopping any intake of alcohol. Follow these instructions at home:     Make sure that your diet includes foods with magnesium. Foods that have a lot of magnesium in them include: Green leafy vegetables, such as spinach and broccoli. Beans and peas. Nuts and seeds, such as almonds and sunflower seeds. Whole grains, such as whole grain bread and fortified cereals. Take magnesium supplements if your health care provider tells you to do that. Take them as directed. Take over-the-counter and prescription medicines only as told by your health care provider. Have your magnesium levels monitored as told by your health care provider. When you are active, drink fluids that contain electrolytes. Avoid drinking alcohol. Keep all follow-up visits as told by your health care provider. This is important. Contact a health care provider  if: You get worse instead of better. Your symptoms return. Get help right away if you: Develop severe muscle weakness. Have trouble breathing. Feel that your heart is racing. Summary Hypomagnesemia is a condition in which the level of magnesium in the blood is low. Hypomagnesemia can affect every organ in the body. Treatment may include eating more foods that contain magnesium, taking magnesium supplements, and not drinking alcohol. Have your magnesium levels monitored as told by your health care provider. This information is not intended to replace advice given to you by your health care provider. Make sure you discuss any questions you have with your healthcare provider. Document Revised: 11/18/2019 Document Reviewed: 11/18/2019 Elsevier Patient Education  [place].

## 2021-01-19 NOTE — Progress Notes (Signed)
Hematology Oncology Progress Note  Clinic Day:  01/19/2021   Referring physician: Glean Hess, MD  Chief Complaint: Sarah [name] is a 65 y.o. 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   Initial bone marrow revealed 80% plasma cells.  Lambda free light chains were 1340.  She had nephrotic range proteinuria.  She initially underwent induction with RVD.  Revlimid maintenance was discontinued on 01/21/2017 secondary to intolerance.     07/19/2019 Bone marrow aspirate and biopsy on  revealed a normocellular marrow with but increased lambda-restricted plasma cells (9% aspirate, 40% CD138 immunohistochemistry).  Findings were consistent with recurrent plasma cell myeloma.  Flow cytometry revealed no monoclonal B-cell or phenotypically aberrant T-cell population. Cytogenetics were 51, XX (normal).  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    Lambda light chains have been followed: 22.2 (ratio 0.56) on 07/03/2017, 30.8 (ratio 0.78) on 09/02/2017, 36.9 (ratio 0.40) on 10/21/2017, 37.4 (ratio 0.41) on 12/16/2017, 70.7(ratio 0.31)  on 02/17/2018, 64.2 (ratio 0.27) on 04/07/2018, 78.9 (ratio 0.18) on 05/26/2018, 128.8 (ratio 0.17) on 08/06/2018, 181.5 (ratio 0.13) on 10/08/2018, 130.9 (ratio 0.13) on 10/20/2018, 160.7 (ratio 0.10) on 12/09/2018, 236.6 (ratio 0.07) on 02/01/2019,  363.6 (ratio 0.04) on 03/22/2019, 404.8 (ratio 0.04) on 04/05/2019, 420.7 (ratio 0.03) on 05/24/2019, 573.4 (ratio 0.03) on 06/23/2019, 451.05 (ratio 0.02) on 08/20/2019, 47.2 (ratio 0.13) on 10/25/2019, 22.4 (ratio 0.21) on 11/25/2019, 16.5 (ratio 0.33) on 01/10/2020, 14.6 (ratio 0.34) on 02/07/2020, 13.1 (ratio 0.31) on 03/07/2020, 10.1 (ratio 0.38) on 04/10/2020, and 9.5 (ratio 0.21) on 06/19/2020.   24 hour UPEP on 06/03/2019 revealed kappa free light chains 95.76, lambda free light chains 1,260.71, and ratio 0.08.  24 hour UPEP on 08/23/2019 revealed total protein of 782 mg/24 hrs with lambda free light chains 1,084.16 mg/L and ratio of 0.10 (1.03-31.76).  M spike in urine was 46.1% (361 mg/24 hrs).    Bone survey on 04/08/2016 and 05/28/2017 revealed no definite lytic lesion seen in the visualized skeleton.  Bone survey on 11/19/2018 revealed no suspicious lucent lesions and no acute bony abnormality.  PET scan on 07/12/2019 revealed no focal metabolic activity to suggest active myeloma within the skeleton. There were no lytic lesions identified on the CT portion of the exam or soft tissue plasmacytomas. There was no evidence of multiple myeloma.    Pretreatment RBC phenotype on 09/23/2019 was positive for C, e, DUFFY B, KIDD B, M, S, and s antigen; negative for c, E, KELL, DUFFY A, KIDD A, and N antigen.    09/27/2019 - 10/25/2019; 12/09/2019 - 03/13/2020 6 cycles of daratumumab and hyaluronidase-fihj, Pomalyst, and Decadron (DPd) .  Cycle #1 was complicated by fever and neutropenia requiring admission.  Cycle #2 was complicated by pneumonia requiring admission.  Cycle #6 was complicated with an ER evaluation for an elevated lactic acid.   05/10/2020 second autologous stem cell transplant at [facility]   She underwent conditioning with melphalan 140  mg/m2.    She is s/p week #3 Velcade maintenance (began 08/31/2020 - 09/28/2020).  She has no increase in neuropathy.  She has severe aortic stenosis.   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 Cardiology is following for a possible TAVR in the future.  Echo on 07/05/2020 revealed moderate to severe aortic stenosis with an EF of 50-55%.  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 Zometa was discontinued in 01/2017.  She has chronic nausea on Phenergan.     B12 deficiency.  B12 was 254 on 04/09/2017, 295 on 08/20/2018, and 391 on 10/08/2018.  She was on oral B12.  She received B12 monthly (last 09/14/2020).  Folate was 12.1 on 01/10/2020.    She has iron deficiency.  Ferritin was 32 on 07/01/2019.  She received Venofer on 07/15/2019 and 07/22/2019.   She has hypogammaglobulinemia.  IgG was 245 on 11/25/2019.  She received monthly IVIG (07/22/021 - 03/22/2020).  She received IVIG 400 mg/kg on 01/20/2020, 200 mg/kg on 02/17/2020, and 300 mg/kg on 03/22/2020.  IVIG on 01/74/9449 was complicated by acute renal failure. IgG trough level was 418 on 02/17/2020.  10/15/2019 - 10/20/2019 She was admitted to [facility] with fever and neutropenia.  Cultures were negative.  CXR was negative. She received broad spectrum antibiotics and daily Granix.  She received IVF for acute renal insufficiency due to diarrhea and dehydration.  Creatine was 1.66 on admission and 1.12 on discharge.    03/01/2020 - 03/05/2020 admitted to [facility] from with fever and neutropenia. CXR revealed no active cardiopulmonary disease. Chest CT with contrast revealed no acute intrathoracic pathology. There were findings which could be suggestive of prior granulomatous disease. She was treated with Cefepime and Vancomycin, then switched to ciprofloxacin on 03/03/2020.   05/08/2020 - 12/05/2021The patient was admitted to [facility] from  for autologous bone marrow transplant.   She received conditioning with melphalan 140 mg/m2.  Bone marrow transplant was on 05/10/2020. The patient developed fevers daily from  05/19/2020 - 05/24/2020. Blood cultures were + for strept sanguis bacteremia.  She was treated cefepime, vancomycin, and solumedrol for possible engraftment syndrome. Cefepime was switched to ceftriaxone; she completed 2 weeks of ceftriaxone on 06/03/2020. She had chemotherapy induced diarrhea.  She experienced urinary retention.  Feb 2022 patient has been on maintenance Velcade 2 mg every 2 weeks.  Chronic diarrhea seen by gastroenterology Dr. Vicente Males and was recommended to avoid artificial sugars in her diet including Diet Coke and coffee mate.  She feels some improvement of her diarrhea frequency since the dietary modification.  GI profile PCR negative, C. difficile toxin A+ B negative, fecal calprotectin 77 12/19/2020 colonoscopy showed 2 subcentimeter polyps in the ascending colon, resected and removed.  Pathology showed tubular adenoma.  No malignancy.  Normal mucosa in the entire examined colon.  Biopsied, pathology showed colonic mucosa with no significant pathological alteration.  Negative for active inflammation and features of chronicity.  Negative for microscopic colitis, dysplasia, and malignanc  Diabetes, patient has discontinued metformin due to diarrhea and started on glipizide 2.5 mg  INTERVAL HISTORY Toneka Fullen is a 65 y.o. female who has above history reviewed by me today presents for follow up visit for management of multiple myeloma Problems and complaints are listed below:  Chronic diarrhea and patient takes Lomotil.2 tablets  every 4 hours as needed. Diarrhea is slightly better She requests Lomotil to be refilled.  chronic neuropathy,  symptoms are stable.   Patient has chronic magnesium, gets IV magnesium twice weekly with each visit.   She has a "blister" which was itchy and reddish on her lower extremity. She applies neosporin ointment. The spot seems to get better.   Chronic epigastric pain is worse, and medication gets stuck. She mentioned to Dr.Anna, and was advised  to take omeprazole 75m BID. Symptoms are not better   Past Medical History:  Diagnosis Date   Abnormal stress test 02/14/2016   Overview:  Added automatically from request for surgery [id number]   Anemia    Anxiety    Arthritis    Bicuspid aortic valve    Bisphosphonate-associated osteonecrosis of the jaw ([facility] 02/25/2017   Due to Zometa   Cardiomyopathy, idiopathic ([place] 08/21/2017   Overview:  Septal and apical hypokinesis with ef 40%   CHF (congestive heart failure) (HCC)    CKD (chronic kidney disease) stage 3, GFR 30-59 ml/min (HCC)    Depression    Diabetes mellitus (HCC)    Dizziness    Fatty liver    Frequent falls    GERD (gastroesophageal reflux disease)    Gout    Heart murmur    History of blood transfusion    [place]    History of uterine fibroid    Hx of cardiac catheterization 06/05/2016   Overview:  Normal coronaries 2017   Hypertension    Hypomagnesemia    IDA (iron deficiency anemia)    Multiple myeloma ([facility]    Personal history of chemotherapy    Renal cyst    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[facility] 06/05/2016   Overview:  Sp ablation 1999    Past Surgical History:  Procedure Laterality Date   ABDOMINAL HYSTERECTOMY     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   CARDIAC ELECTROPHYSIOLOGY MAPPING AND ABLATION     CARPAL TUNNEL RELEASE Bilateral    CHOLECYSTECTOMY  2008   COLONOSCOPY WITH PROPOFOL N/A 05/07/2017   Procedure: COLONOSCOPY WITH PROPOFOL;  Surgeon: AJonathon Bellows MD;  [facility];  Service: Gastroenterology;  Laterality: N/A;   COLONOSCOPY WITH PROPOFOL N/A 12/19/2020   Procedure: COLONOSCOPY WITH PROPOFOL;  Surgeon: AJonathon Bellows MD;  [facility];  Service: Gastroenterology;  Laterality: N/A;   ESOPHAGOGASTRODUODENOSCOPY (EGD) WITH PROPOFOL N/A 05/07/2017   Procedure: ESOPHAGOGASTRODUODENOSCOPY (EGD) WITH PROPOFOL;  Surgeon: AJonathon Bellows MD;  [facility];  Service: Gastroenterology;   Laterality: N/A;   FOOT SURGERY Bilateral    INCONTINENCE SURGERY  2009   INTERSTIM IMPLANT PLACEMENT     other     over active bladder   OTHER SURGICAL HISTORY     bladder stimulator    PARTIAL HYSTERECTOMY  03/1996   fibroids   PORTA CATH INSERTION N/A 03/10/2019   Procedure: PORTA CATH INSERTION;  Surgeon: DAlgernon Huxley MD;  [facility];  Service: Cardiovascular;  Laterality: N/A;   TONSILLECTOMY  2007    Family History  Problem Relation Age of Onset   Colon cancer Father    Renal Disease Father    Diabetes Mellitus II Father    Melanoma Paternal Grandmother    Breast cancer Maternal Aunt 540  Anemia Mother    Heart disease Mother    Heart failure Mother    Renal Disease Mother    Congestive Heart Failure Mother    Heart disease Maternal Uncle    Throat cancer Maternal Uncle    Lung cancer Maternal Uncle  Liver disease Maternal Uncle    Heart failure Maternal Uncle    Hearing loss Son 87       Suicide     Social History:  reports that she quit smoking about 29 years ago. Her smoking use included cigarettes. She has a 20.00 pack-year smoking history. She has never used smokeless tobacco. She reports current alcohol use. She reports that she does not use drugs.  She is on disability. She notes exposure to perchloroethylene [facility]).  She lives much of her adult life in [employer].   Her husband passed away.    Allergies:  Allergies  Allergen Reactions   Oxycodone-Acetaminophen Anaphylaxis    Swelling and rash   Celebrex [Celecoxib] Diarrhea   Codeine    Plerixafor     In 2016 during ASCT collection patient developed fever to 103.60F and required hospitalization   Benadryl [Diphenhydramine] Palpitations   Morphine Itching and Rash   Ondansetron Diarrhea   Tylenol [Acetaminophen] Itching and Rash    Current Medications: Current Outpatient Medications  Medication Sig Dispense Refill   acetaminophen (TYLENOL) 500 MG tablet Take 500 mg by  mouth every 6 (six) hours as needed.     acyclovir (ZOVIRAX) 400 MG tablet Take 400 mg by mouth 2 (two) times daily.     allopurinol (ZYLOPRIM) 100 MG tablet TAKE 1 TABLET(100 MG) BY MOUTH DAILY as needed 90 tablet 1   bisoprolol (ZEBETA) 5 MG tablet Take 5 mg by mouth daily.     diclofenac sodium (VOLTAREN) 1 % GEL Apply 2 g topically 4 (four) times daily. 100 g 1   diphenhydrAMINE (BENADRYL) 50 MG capsule Take 50 mg by mouth as needed.     FLUoxetine (PROZAC) 40 MG capsule TAKE 1 CAPSULE EVERY DAY 90 capsule 1   fluticasone (FLONASE) 50 MCG/ACT nasal spray Place 2 sprays into both nostrils daily. 16 g 2   glucose blood test strip      loperamide (IMODIUM) 2 MG capsule Take 1 capsule (2 mg total) by mouth 4 (four) times daily as needed for diarrhea or loose stools. 120 capsule 3   methocarbamol (ROBAXIN) 500 MG tablet TAKE 1 TABLET FOUR TIMES DAILY. 120 tablet 0   Multiple Vitamin (MULTIVITAMIN) tablet Take 1 tablet by mouth daily.     Multiple Vitamins-Minerals (HAIR/SKIN/NAILS) TABS Take 1 tablet by mouth daily.     omeprazole (PRILOSEC) 40 MG capsule Take 1 capsule (40 mg total) by mouth in the morning and at bedtime. 180 capsule 1   promethazine (PHENERGAN) 25 MG tablet Take 1 tablet (25 mg total) by mouth every 6 (six) hours as needed for nausea or vomiting. 90 tablet 0   promethazine-dextromethorphan (PROMETHAZINE-DM) 6.25-15 MG/5ML syrup Take 5 mLs by mouth 4 (four) times daily as needed for cough. 118 mL 0   traZODone (DESYREL) 100 MG tablet Take 1 tablet (100 mg total) by mouth at bedtime. 90 tablet 1   diphenoxylate-atropine (LOMOTIL) 2.5-0.025 MG tablet Take 2 tablets by mouth 4 (four) times daily as needed for diarrhea or loose stools. 90 tablet 1   lactulose (CHRONULAC) 10 GM/15ML solution Take 20 g by mouth daily as needed. (Patient not taking: No sig reported)     lidocaine (LIDODERM) 5 % 1 patch daily. (Patient not taking: No sig reported)     No current facility-administered  medications for this visit.   Facility-Administered Medications Ordered in Other Visits  Medication Dose Route Frequency Provider Last Rate Last Admin   0.9 %  sodium chloride infusion   Intravenous Continuous Lequita Asal, MD   Stopped at 01/20/20 1232   0.9 %  sodium chloride infusion   Intravenous Continuous Lequita Asal, MD   Stopped at 12/19/20 0856   0.9 %  sodium chloride infusion   Intravenous Continuous Earlie Server, MD 10 mL/hr at 11/30/20 0957 New Bag at 11/30/20 0957   heparin lock flush 100 unit/mL  500 Units Intravenous Once Faythe Casa E, NP       heparin lock flush 100 unit/mL  500 Units Intravenous Once Nolon Stalls C, MD       heparin lock flush 100 unit/mL  500 Units Intravenous Once Earlie Server, MD       sodium chloride flush (NS) 0.9 % injection 10 mL  10 mL Intravenous PRN Nolon Stalls C, MD   10 mL at 02/07/20 0815   sodium chloride flush (NS) 0.9 % injection 10 mL  10 mL Intracatheter PRN Cammie Sickle, MD   10 mL at 02/10/20 0815   sodium chloride flush (NS) 0.9 % injection 10 mL  10 mL Intravenous PRN Jacquelin Hawking, NP   10 mL at 03/01/20 1143   sodium chloride flush (NS) 0.9 % injection 10 mL  10 mL Intravenous PRN Earlie Server, MD   10 mL at 01/19/21 1044    Review of Systems  Constitutional:  Negative for chills, fever, malaise/fatigue and weight loss.  HENT:  Negative for sore throat.   Eyes:  Negative for redness.  Respiratory:  Negative for cough, shortness of breath and wheezing.   Cardiovascular:  Negative for chest pain, palpitations and leg swelling.  Gastrointestinal:  Positive for diarrhea and nausea. Negative for abdominal pain, blood in stool and vomiting.  Genitourinary:  Negative for dysuria.  Musculoskeletal:  Positive for back pain and joint pain. Negative for myalgias.  Skin:  Negative for rash.  Neurological:  Positive for tingling and sensory change. Negative for dizziness and tremors.  Endo/Heme/Allergies:  Does  not bruise/bleed easily.  Psychiatric/Behavioral:  Negative for hallucinations.    Performance status (ECOG): 1  Vitals Blood pressure (!) 158/71, pulse 65, temperature (!) 96.9 F (36.1 C), resp. rate 16, weight 154 lb 3.2 oz (69.9 kg).  Physical Exam Vitals and nursing note reviewed.  Constitutional:      General: She is not in acute distress.    Appearance: She is well-developed. She is not ill-appearing, toxic-appearing or diaphoretic.     Interventions: Face mask in place.  HENT:     Head: Normocephalic and atraumatic.     Mouth/Throat:     Mouth: Mucous membranes are moist.     Pharynx: Oropharynx is clear.  Eyes:     General: No scleral icterus.    Pupils: Pupils are equal, round, and reactive to light.  Cardiovascular:     Rate and Rhythm: Normal rate and regular rhythm.     Heart sounds: Normal heart sounds. No murmur heard. Pulmonary:     Effort: Pulmonary effort is normal. No respiratory distress.     Breath sounds: Normal breath sounds. No wheezing or rales.  Chest:     Chest wall: No tenderness.  Breasts:    Right: No supraclavicular adenopathy.     Left: No supraclavicular adenopathy.  Abdominal:     General: Bowel sounds are normal. There is no distension.     Palpations: Abdomen is soft. There is no hepatomegaly, splenomegaly or mass.     Tenderness: There is  no abdominal tenderness. There is no guarding or rebound.  Musculoskeletal:        General: No tenderness. Normal range of motion.     Cervical back: Normal range of motion and neck supple.     Right lower leg: No edema.     Left lower leg: No edema.  Lymphadenopathy:     Head:     Right side of head: No preauricular, posterior auricular or occipital adenopathy.     Left side of head: No preauricular, posterior auricular or occipital adenopathy.     Cervical: No cervical adenopathy.     Upper Body:     Right upper body: No supraclavicular adenopathy.     Left upper body: No supraclavicular  adenopathy.  Skin:    General: Skin is warm and dry.     Coloration: Skin is not jaundiced.     Comments: Right calf ~1cm area of erythema.  Neurological:     Mental Status: She is alert and oriented to person, place, and time. Mental status is at baseline.  Psychiatric:        Mood and Affect: Mood normal.   Laboratory data Infusion on 01/19/2021  Component Date Value Ref Range Status   Sodium 01/19/2021 138  135 - 145 mmol/L Final   Potassium 01/19/2021 4.7  3.5 - 5.1 mmol/L Final   Chloride 01/19/2021 106  98 - 111 mmol/L Final   CO2 01/19/2021 23  22 - 32 mmol/L Final   Glucose, Bld 01/19/2021 144 (A) 70 - 99 mg/dL Final   Glucose reference range applies only to samples taken after fasting for at least 8 hours.   BUN 01/19/2021 19  8 - 23 mg/dL Final   Creatinine, Ser 01/19/2021 0.98  0.44 - 1.00 mg/dL Final   Calcium 01/19/2021 9.0  8.9 - 10.3 mg/dL Final   Total Protein 01/19/2021 6.8  6.5 - 8.1 g/dL Final   Albumin 01/19/2021 4.2  3.5 - 5.0 g/dL Final   AST 01/19/2021 32  15 - 41 U/L Final   ALT 01/19/2021 31  0 - 44 U/L Final   Alkaline Phosphatase 01/19/2021 92  38 - 126 U/L Final   Total Bilirubin 01/19/2021 0.2 (A) 0.3 - 1.2 mg/dL Final   GFR, Estimated 01/19/2021 >60  >60 mL/min Final   Comment: (NOTE) Calculated using the CKD-EPI Creatinine Equation (2021)    Anion gap 01/19/2021 9  5 - 15 Final   Performed at [facility], [address] [place], [place] [address]   Magnesium 01/19/2021 1.6 (A) 1.7 - 2.4 mg/dL Final   Performed at [facility], [address] [place], [place] [address]   WBC 01/19/2021 4.9  4.0 - 10.5 K/uL Final   RBC 01/19/2021 4.09  3.87 - 5.11 MIL/uL Final   Hemoglobin 01/19/2021 11.2 (A) 12.0 - 15.0 g/dL Final   HCT 01/19/2021 34.1 (A) 36.0 - 46.0 % Final   MCV 01/19/2021 83.4  80.0 - 100.0 fL Final   MCH 01/19/2021 27.4  26.0 - 34.0 pg Final   MCHC 01/19/2021 32.8  30.0 - 36.0 g/dL Final   RDW 01/19/2021 14.8  11.5 - 15.5  % Final   Platelets 01/19/2021 109 (A) 150 - 400 K/uL Final   nRBC 01/19/2021 0.0  0.0 - 0.2 % Final   Neutrophils Relative % 01/19/2021 66  % Final   Neutro Abs 01/19/2021 3.3  1.7 - 7.7 K/uL Final   Lymphocytes Relative 01/19/2021 20  % Final  Lymphs Abs 01/19/2021 1.0  0.7 - 4.0 K/uL Final   Monocytes Relative 01/19/2021 8  % Final   Monocytes Absolute 01/19/2021 0.4  0.1 - 1.0 K/uL Final   Eosinophils Relative 01/19/2021 5  % Final   Eosinophils Absolute 01/19/2021 0.2  0.0 - 0.5 K/uL Final   Basophils Relative 01/19/2021 0  % Final   Basophils Absolute 01/19/2021 0.0  0.0 - 0.1 K/uL Final   Immature Granulocytes 01/19/2021 1  % Final   Abs Immature Granulocytes 01/19/2021 0.05  0.00 - 0.07 K/uL Final   Performed at [facility], [address] [place], [place] [address]    Assessment/Plan 1. Encounter for antineoplastic chemotherapy   2. Hypomagnesemia   3. Multiple myeloma in remission ([place])   4. B12 deficiency   5. Chemotherapy-induced neuropathy ([place])   6. Diarrhea, unspecified type   7. 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[place])     # Recurrent lambda light chain multiple myeloma s/p second autologous bone marrow transplant [05/10/2020] Clinically patient is doing well.  She has been on Velcade maintenance every 2 weeks.   Insurance finally approved subcutaneous Velcade.  Labs are reviewed and discussed with patient. Proceed with Velcade SQ 55m today.  12/14/2020 kappa/light chain ratio trends up to 1.09, still within normal limits.  Close monitor.. Continue prophylaxis with Valtrex up to 1 year after transplant-November 2022 [facility] [phone].  #Hyperglobulinemia post stem cell transplantation.  Currently she has no frequent infection, will hold off IVIG replacement therapy.  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  Patient prefers to be monitored clinically rather than starting  neuropathy medication.   # iron deficiency anemia, Hemoglobin is stable at 11.5,  11/16/2020 iron panel showed increased iron saturation to 15.   #chronic diarrhea, Gastroenterology recommendation was reviewed.  Avoid artificial sugar.  Colonoscopy showed no microscopic colitis. Her symptom seems to have improved slightly after dietary modification. Velcade side effects may also contribute to diarrhea  #Chronic hypomagnesemia Magnesium is 1.5 today, proceed with 2 g of IV magnesium today. Off Slow-Mag 64 mg twice daily.  Per GI recommendation as this potentially precipitate her diarrhea. continue magnesium checked twice a week +/- IV magnesium  #Dysphagia, chronic nausea, patient request to have IV Phenergan in the clinic with treatment.  She has antiemetics at home. Recommend her to call GI office.   # B12 deficiency, patient has been on vitamin B12 monthly. Last dose 11/09/2020. Vitamin B12 is normal at 431.  # right lower extremity skin lesion likely is from bug bite. Continue neosporine topically until heals.   RTC: 1 week, lab magnesium/IV magnesium twice per week,  RTC in 2 weeks for labs (CBC with diff, CMP, Mg ), md  for  Velcade and IV Mg + B12 injection.   I discussed the assessment and treatment plan with the patient.  The patient was provided an opportunity to ask questions and all were answered.  The patient agreed with the plan and demonstrated an understanding of the instructions.  The patient was advised to call back if the symptoms worsen or if the condition fails to improve as anticipated.   ZEarlie Server MD, PhD Hematology Oncology [facility]7/22/2022

## 2021-01-22 ENCOUNTER — Telehealth: Payer: Self-pay

## 2021-01-22 ENCOUNTER — Other Ambulatory Visit: Payer: Self-pay

## 2021-01-22 MED ORDER — DIPHENOXYLATE-ATROPINE 2.5-0.025 MG PO TABS: 2.0000 | ORAL_TABLET | Freq: Four times a day (QID) | ORAL | 3 refills | Status: DC | PRN

## 2021-01-22 NOTE — Telephone Encounter (Signed)
Received PA request for Lomotil from pharmacy.  Tried submitting on Cover My Meds with a response of "Available without authorization".  Called [facility] in [place] for clarification of need to PA.  Insurance does not cover the quantity of #90 which is a 11 day supply but does cover quantity of #64 which is an 8 day supply.    MD notified.  Lomotil #64, with 3 refills rx entered and pending for MD to sign and send to pharmacy.

## 2021-01-23 ENCOUNTER — Inpatient Hospital Stay: Payer: Medicare HMO

## 2021-01-23 DIAGNOSIS — C9001 Multiple myeloma in remission: Secondary | ICD-10-CM

## 2021-01-23 DIAGNOSIS — E860 Dehydration: Secondary | ICD-10-CM | POA: Diagnosis not present

## 2021-01-23 DIAGNOSIS — N289 Disorder of kidney and ureter, unspecified: Secondary | ICD-10-CM | POA: Diagnosis not present

## 2021-01-23 DIAGNOSIS — E538 Deficiency of other specified B group vitamins: Secondary | ICD-10-CM | POA: Diagnosis not present

## 2021-01-23 DIAGNOSIS — C9000 Multiple myeloma not having achieved remission: Secondary | ICD-10-CM | POA: Diagnosis not present

## 2021-01-23 DIAGNOSIS — Z5112 Encounter for antineoplastic immunotherapy: Secondary | ICD-10-CM | POA: Diagnosis not present

## 2021-01-23 DIAGNOSIS — D801 Nonfamilial hypogammaglobulinemia: Secondary | ICD-10-CM | POA: Diagnosis not present

## 2021-01-23 DIAGNOSIS — G6289 Other specified polyneuropathies: Secondary | ICD-10-CM | POA: Diagnosis not present

## 2021-01-23 DIAGNOSIS — D509 Iron deficiency anemia, unspecified: Secondary | ICD-10-CM | POA: Diagnosis not present

## 2021-01-23 DIAGNOSIS — K635 Polyp of colon: Secondary | ICD-10-CM | POA: Diagnosis not present

## 2021-01-23 LAB — MAGNESIUM: Magnesium: 1.3 mg/dL — ABNORMAL LOW (ref 1.7–2.4)

## 2021-01-23 MED ORDER — SODIUM CHLORIDE 0.9 % IV SOLN
25.0000 mg | Freq: Once | INTRAVENOUS | Status: AC
  Administered 2021-01-23: 25 mg via INTRAVENOUS
  Filled 2021-01-23: qty 1

## 2021-01-23 MED ORDER — MAGNESIUM SULFATE 2 GM/50ML IV SOLN
2.0000 g | Freq: Once | INTRAVENOUS | Status: AC
  Administered 2021-01-23: 2 g via INTRAVENOUS
  Filled 2021-01-23: qty 50

## 2021-01-23 MED ORDER — SODIUM CHLORIDE 0.9 % IV SOLN: 6.0000 g | Freq: Once | INTRAVENOUS | Status: DC

## 2021-01-23 MED ORDER — HEPARIN SOD (PORK) LOCK FLUSH 100 UNIT/ML IV SOLN
500.0000 [IU] | Freq: Once | INTRAVENOUS | Status: AC
  Administered 2021-01-23: 500 [IU] via INTRAVENOUS
  Filled 2021-01-23: qty 5

## 2021-01-23 MED ORDER — SODIUM CHLORIDE 0.9 % IV SOLN
Freq: Once | INTRAVENOUS | Status: AC
  Filled 2021-01-23: qty 250

## 2021-01-23 MED ORDER — SODIUM CHLORIDE 0.9% FLUSH
10.0000 mL | INTRAVENOUS | Status: DC | PRN
  Administered 2021-01-23: 10 mL via INTRAVENOUS
  Filled 2021-01-23: qty 10

## 2021-01-23 MED ORDER — MAGNESIUM SULFATE 4 GM/100ML IV SOLN
4.0000 g | Freq: Once | INTRAVENOUS | Status: AC
  Administered 2021-01-23: 4 g via INTRAVENOUS
  Filled 2021-01-23: qty 100

## 2021-01-23 NOTE — Progress Notes (Signed)
Magnesium level 1.3. today; patient received 6 grams of Magnesium intravenously over 3 hours. Tolerated well. Discharged to home. VSS.

## 2021-01-23 NOTE — Patient Instructions (Signed)
Magnesium Sulfate Injection What is this medication? MAGNESIUM SULFATE (mag NEE zee um SUL fate) prevents and treats low levels of magnesium in your body. It may also be used to prevent and treat seizures during pregnancy in people with high blood pressure disorders, such as preeclampsia or eclampsia. Magnesium plays an important role in maintaining the health of your muscles and nervous system. This medicine may be used for other purposes; ask your health care provider or pharmacist if you have questions. What should I tell my care team before I take this medication? They need to know if you have any of these conditions: Heart disease History of irregular heart beat Kidney disease An unusual or allergic reaction to magnesium sulfate, medications, foods, dyes, or preservatives Pregnant or trying to get pregnant Breast-feeding How should I use this medication? This medication is for infusion into a vein. It is given in a hospital or clinic setting. Talk to your care team about the use of this medication in children. While this medication may be prescribed for selected conditions, precautions do apply. Overdosage: If you think you have taken too much of this medicine contact a poison control center or emergency room at once. NOTE: This medicine is only for you. Do not share this medicine with others. What if I miss a dose? This does not apply. What may interact with this medication? Certain medications for anxiety or sleep Certain medications for seizures like phenobarbital Digoxin Medications that relax muscles for surgery Narcotic medications for pain This list may not describe all possible interactions. Give your health care provider a list of all the medicines, herbs, non-prescription drugs, or dietary supplements you use. Also tell them if you smoke, drink alcohol, or use illegal drugs. Some items may interact with your medicine. What should I watch for while using this medication? Your  condition will be monitored carefully while you are receiving this medication. You may need blood work done while you are receiving this medication. What side effects may I notice from receiving this medication? Side effects that you should report to your care team as soon as possible: Allergic reactions-skin rash, itching, hives, swelling of the face, lips, tongue, or throat High magnesium level-confusion, drowsiness, facial flushing, redness, sweating, muscle weakness, fast or irregular heartbeat, trouble breathing Low blood pressure-dizziness, feeling faint or lightheaded, blurry vision Side effects that usually do not require medical attention (report to your care team if they continue or are bothersome): Headache Nausea This list may not describe all possible side effects. Call your doctor for medical advice about side effects. You may report side effects to FDA at 1-800-FDA-1088. Where should I keep my medication? This medication is given in a hospital or clinic and will not be stored at home. NOTE: This sheet is a summary. It may not cover all possible information. If you have questions about this medicine, talk to your doctor, pharmacist, or health care provider.  2022 [employer] (2020-08-07 13:10:26)  

## 2021-01-24 DIAGNOSIS — G894 Chronic pain syndrome: Secondary | ICD-10-CM | POA: Diagnosis not present

## 2021-01-24 DIAGNOSIS — Z9889 Other specified postprocedural states: Secondary | ICD-10-CM | POA: Diagnosis not present

## 2021-01-24 DIAGNOSIS — I493 Ventricular premature depolarization: Secondary | ICD-10-CM | POA: Diagnosis not present

## 2021-01-24 DIAGNOSIS — M47816 Spondylosis without myelopathy or radiculopathy, lumbar region: Secondary | ICD-10-CM | POA: Diagnosis not present

## 2021-01-24 DIAGNOSIS — Z79899 Other long term (current) drug therapy: Secondary | ICD-10-CM | POA: Diagnosis not present

## 2021-01-24 DIAGNOSIS — I350 Nonrheumatic aortic (valve) stenosis: Secondary | ICD-10-CM | POA: Diagnosis not present

## 2021-01-24 DIAGNOSIS — C9000 Multiple myeloma not having achieved remission: Secondary | ICD-10-CM | POA: Diagnosis not present

## 2021-01-24 DIAGNOSIS — C9001 Multiple myeloma in remission: Secondary | ICD-10-CM | POA: Diagnosis not present

## 2021-01-24 DIAGNOSIS — Z79891 Long term (current) use of opiate analgesic: Secondary | ICD-10-CM | POA: Diagnosis not present

## 2021-01-24 DIAGNOSIS — Z9484 Stem cells transplant status: Secondary | ICD-10-CM | POA: Diagnosis not present

## 2021-01-26 ENCOUNTER — Inpatient Hospital Stay: Payer: Medicare HMO

## 2021-01-26 VITALS — BP 134/76 | HR 68 | Temp 97.4°F | Resp 18

## 2021-01-26 DIAGNOSIS — E538 Deficiency of other specified B group vitamins: Secondary | ICD-10-CM

## 2021-01-26 DIAGNOSIS — Z5112 Encounter for antineoplastic immunotherapy: Secondary | ICD-10-CM | POA: Diagnosis not present

## 2021-01-26 DIAGNOSIS — T451X5A Adverse effect of antineoplastic and immunosuppressive drugs, initial encounter: Secondary | ICD-10-CM

## 2021-01-26 DIAGNOSIS — N289 Disorder of kidney and ureter, unspecified: Secondary | ICD-10-CM | POA: Diagnosis not present

## 2021-01-26 DIAGNOSIS — R110 Nausea: Secondary | ICD-10-CM

## 2021-01-26 DIAGNOSIS — C9000 Multiple myeloma not having achieved remission: Secondary | ICD-10-CM

## 2021-01-26 DIAGNOSIS — G6289 Other specified polyneuropathies: Secondary | ICD-10-CM | POA: Diagnosis not present

## 2021-01-26 DIAGNOSIS — D649 Anemia, unspecified: Secondary | ICD-10-CM

## 2021-01-26 DIAGNOSIS — C9001 Multiple myeloma in remission: Secondary | ICD-10-CM

## 2021-01-26 DIAGNOSIS — D509 Iron deficiency anemia, unspecified: Secondary | ICD-10-CM | POA: Diagnosis not present

## 2021-01-26 DIAGNOSIS — K635 Polyp of colon: Secondary | ICD-10-CM | POA: Diagnosis not present

## 2021-01-26 DIAGNOSIS — D801 Nonfamilial hypogammaglobulinemia: Secondary | ICD-10-CM | POA: Diagnosis not present

## 2021-01-26 DIAGNOSIS — E860 Dehydration: Secondary | ICD-10-CM | POA: Diagnosis not present

## 2021-01-26 DIAGNOSIS — Z5111 Encounter for antineoplastic chemotherapy: Secondary | ICD-10-CM

## 2021-01-26 DIAGNOSIS — G620 Drug-induced polyneuropathy: Secondary | ICD-10-CM

## 2021-01-26 DIAGNOSIS — Z95828 Presence of other vascular implants and grafts: Secondary | ICD-10-CM

## 2021-01-26 LAB — CBC WITH DIFFERENTIAL/PLATELET
Abs Immature Granulocytes: 0.04 10*3/uL (ref 0.00–0.07)
Basophils Absolute: 0 10*3/uL (ref 0.0–0.1)
Basophils Relative: 1 %
Eosinophils Absolute: 0.2 10*3/uL (ref 0.0–0.5)
Eosinophils Relative: 4 %
HCT: 35.4 % — ABNORMAL LOW (ref 36.0–46.0)
Hemoglobin: 11.6 g/dL — ABNORMAL LOW (ref 12.0–15.0)
Immature Granulocytes: 1 %
Lymphocytes Relative: 17 %
Lymphs Abs: 0.9 10*3/uL (ref 0.7–4.0)
MCH: 27.1 pg (ref 26.0–34.0)
MCHC: 32.8 g/dL (ref 30.0–36.0)
MCV: 82.7 fL (ref 80.0–100.0)
Monocytes Absolute: 0.5 10*3/uL (ref 0.1–1.0)
Monocytes Relative: 9 %
Neutro Abs: 3.6 10*3/uL (ref 1.7–7.7)
Neutrophils Relative %: 68 %
Platelets: 111 10*3/uL — ABNORMAL LOW (ref 150–400)
RBC: 4.28 MIL/uL (ref 3.87–5.11)
RDW: 14.6 % (ref 11.5–15.5)
WBC: 5.2 10*3/uL (ref 4.0–10.5)
nRBC: 0 % (ref 0.0–0.2)

## 2021-01-26 LAB — COMPREHENSIVE METABOLIC PANEL
ALT: 31 U/L (ref 0–44)
AST: 30 U/L (ref 15–41)
Albumin: 4.4 g/dL (ref 3.5–5.0)
Alkaline Phosphatase: 98 U/L (ref 38–126)
Anion gap: 9 (ref 5–15)
BUN: 21 mg/dL (ref 8–23)
CO2: 22 mmol/L (ref 22–32)
Calcium: 9.4 mg/dL (ref 8.9–10.3)
Chloride: 105 mmol/L (ref 98–111)
Creatinine, Ser: 0.95 mg/dL (ref 0.44–1.00)
GFR, Estimated: >60 mL/min (ref >60)
Glucose, Bld: 129 mg/dL — ABNORMAL HIGH (ref 70–99)
Potassium: 4.5 mmol/L (ref 3.5–5.1)
Sodium: 136 mmol/L (ref 135–145)
Total Bilirubin: 0.2 mg/dL — ABNORMAL LOW (ref 0.3–1.2)
Total Protein: 7 g/dL (ref 6.5–8.1)

## 2021-01-26 LAB — MAGNESIUM: Magnesium: 1.5 mg/dL — ABNORMAL LOW (ref 1.7–2.4)

## 2021-01-26 MED ORDER — HEPARIN SOD (PORK) LOCK FLUSH 100 UNIT/ML IV SOLN
INTRAVENOUS | Status: AC
  Filled 2021-01-26: qty 5

## 2021-01-26 MED ORDER — SODIUM CHLORIDE 0.9 % IV SOLN
Freq: Once | INTRAVENOUS | Status: AC
  Filled 2021-01-26: qty 250

## 2021-01-26 MED ORDER — SODIUM CHLORIDE 0.9% FLUSH
10.0000 mL | Freq: Once | INTRAVENOUS | Status: DC
  Filled 2021-01-26: qty 10

## 2021-01-26 MED ORDER — MAGNESIUM SULFATE 2 GM/50ML IV SOLN
2.0000 g | Freq: Once | INTRAVENOUS | Status: AC
  Administered 2021-01-26: 2 g via INTRAVENOUS
  Filled 2021-01-26: qty 50

## 2021-01-26 MED ORDER — HEPARIN SOD (PORK) LOCK FLUSH 100 UNIT/ML IV SOLN
500.0000 [IU] | Freq: Once | INTRAVENOUS | Status: DC
  Filled 2021-01-26: qty 5

## 2021-01-26 MED ORDER — HEPARIN SOD (PORK) LOCK FLUSH 100 UNIT/ML IV SOLN
500.0000 [IU] | Freq: Once | INTRAVENOUS | Status: AC
Start: 2021-01-26 — End: 2021-01-26
  Administered 2021-01-26: 500 [IU] via INTRAVENOUS
  Filled 2021-01-26: qty 5

## 2021-01-26 NOTE — Patient Instructions (Signed)
Hypomagnesemia Hypomagnesemia is a condition in which the level of magnesium in the blood is low. Magnesium is a mineral that is found in many foods. It is used in many different processes in the body. Hypomagnesemia can affect every organ in thebody. In severe cases, it can cause life-threatening problems. What are the causes? This condition may be caused by: Not getting enough magnesium in your diet. Malnutrition. Problems with absorbing magnesium from the intestines. Dehydration. Alcohol abuse. Vomiting. Severe or chronic diarrhea. Some medicines, including medicines that make you urinate more (diuretics). Certain diseases, such as kidney disease, diabetes, celiac disease, and overactive thyroid. What are the signs or symptoms? Symptoms of this condition include: Loss of appetite. Nausea and vomiting. Involuntary shaking or trembling of a body part (tremor). Muscle weakness. Tingling in the arms and legs. Sudden tightening of muscles (muscle spasms). Confusion. Psychiatric issues, such as depression, irritability, or psychosis. A feeling of fluttering of the heart. Seizures. These symptoms are more severe if magnesium levels drop suddenly. How is this diagnosed? This condition may be diagnosed based on: Your symptoms and medical history. A physical exam. Blood and urine tests. How is this treated? Treatment depends on the cause and the severity of the condition. It may be treated with: A magnesium supplement. This can be taken in pill form. If the condition is severe, magnesium is usually given through an IV. Changes to your diet. You may be directed to eat foods that have a lot of magnesium, such as green leafy vegetables, peas, beans, and nuts. Stopping any intake of alcohol. Follow these instructions at home:     Make sure that your diet includes foods with magnesium. Foods that have a lot of magnesium in them include: Green leafy vegetables, such as spinach and  broccoli. Beans and peas. Nuts and seeds, such as almonds and sunflower seeds. Whole grains, such as whole grain bread and fortified cereals. Take magnesium supplements if your health care provider tells you to do that. Take them as directed. Take over-the-counter and prescription medicines only as told by your health care provider. Have your magnesium levels monitored as told by your health care provider. When you are active, drink fluids that contain electrolytes. Avoid drinking alcohol. Keep all follow-up visits as told by your health care provider. This is important. Contact a health care provider if: You get worse instead of better. Your symptoms return. Get help right away if you: Develop severe muscle weakness. Have trouble breathing. Feel that your heart is racing. Summary Hypomagnesemia is a condition in which the level of magnesium in the blood is low. Hypomagnesemia can affect every organ in the body. Treatment may include eating more foods that contain magnesium, taking magnesium supplements, and not drinking alcohol. Have your magnesium levels monitored as told by your health care provider. This information is not intended to replace advice given to you by your health care provider. Make sure you discuss any questions you have with your healthcare provider. Document Revised: 11/18/2019 Document Reviewed: 11/18/2019 Elsevier Patient Education  2022 Elsevier Inc.  

## 2021-01-29 ENCOUNTER — Encounter: Payer: Self-pay | Admitting: *Deleted

## 2021-01-29 LAB — MULTIPLE MYELOMA PANEL, SERUM
Albumin SerPl Elph-Mcnc: 4.3 g/dL (ref 2.9–4.4)
Albumin/Glob SerPl: 1.9 — ABNORMAL HIGH (ref 0.7–1.7)
Alpha 1: 0.2 g/dL (ref 0.0–0.4)
Alpha2 Glob SerPl Elph-Mcnc: 1 g/dL (ref 0.4–1.0)
B-Globulin SerPl Elph-Mcnc: 0.8 g/dL (ref 0.7–1.3)
Gamma Glob SerPl Elph-Mcnc: 0.3 g/dL — ABNORMAL LOW (ref 0.4–1.8)
Globulin, Total: 2.3 g/dL (ref 2.2–3.9)
IgA: 38 mg/dL — ABNORMAL LOW (ref 87–352)
IgG (Immunoglobin G), Serum: 396 mg/dL — ABNORMAL LOW (ref 586–1602)
IgM (Immunoglobulin M), Srm: 27 mg/dL (ref 26–217)
Total Protein ELP: 6.6 g/dL (ref 6.0–8.5)

## 2021-01-29 LAB — KAPPA/LAMBDA LIGHT CHAINS
Kappa free light chain: 14.1 mg/L (ref 3.3–19.4)
Kappa, lambda light chain ratio: 0.97 (ref 0.26–1.65)
Lambda free light chains: 14.6 mg/L (ref 5.7–26.3)

## 2021-01-30 ENCOUNTER — Inpatient Hospital Stay: Payer: Medicare HMO

## 2021-01-30 ENCOUNTER — Inpatient Hospital Stay: Payer: Medicare HMO | Attending: Oncology

## 2021-01-30 ENCOUNTER — Other Ambulatory Visit: Payer: Self-pay

## 2021-01-30 DIAGNOSIS — G620 Drug-induced polyneuropathy: Secondary | ICD-10-CM | POA: Diagnosis not present

## 2021-01-30 DIAGNOSIS — N183 Chronic kidney disease, stage 3 unspecified: Secondary | ICD-10-CM | POA: Insufficient documentation

## 2021-01-30 DIAGNOSIS — M2550 Pain in unspecified joint: Secondary | ICD-10-CM | POA: Insufficient documentation

## 2021-01-30 DIAGNOSIS — D509 Iron deficiency anemia, unspecified: Secondary | ICD-10-CM | POA: Diagnosis not present

## 2021-01-30 DIAGNOSIS — C9001 Multiple myeloma in remission: Secondary | ICD-10-CM | POA: Insufficient documentation

## 2021-01-30 DIAGNOSIS — K521 Toxic gastroenteritis and colitis: Secondary | ICD-10-CM | POA: Diagnosis not present

## 2021-01-30 DIAGNOSIS — R1310 Dysphagia, unspecified: Secondary | ICD-10-CM | POA: Insufficient documentation

## 2021-01-30 DIAGNOSIS — T451X5A Adverse effect of antineoplastic and immunosuppressive drugs, initial encounter: Secondary | ICD-10-CM | POA: Insufficient documentation

## 2021-01-30 DIAGNOSIS — Z833 Family history of diabetes mellitus: Secondary | ICD-10-CM | POA: Diagnosis not present

## 2021-01-30 DIAGNOSIS — R339 Retention of urine, unspecified: Secondary | ICD-10-CM | POA: Diagnosis not present

## 2021-01-30 DIAGNOSIS — Z8249 Family history of ischemic heart disease and other diseases of the circulatory system: Secondary | ICD-10-CM | POA: Insufficient documentation

## 2021-01-30 DIAGNOSIS — Z87891 Personal history of nicotine dependence: Secondary | ICD-10-CM | POA: Diagnosis not present

## 2021-01-30 DIAGNOSIS — Z801 Family history of malignant neoplasm of trachea, bronchus and lung: Secondary | ICD-10-CM | POA: Diagnosis not present

## 2021-01-30 DIAGNOSIS — Z803 Family history of malignant neoplasm of breast: Secondary | ICD-10-CM | POA: Insufficient documentation

## 2021-01-30 DIAGNOSIS — M549 Dorsalgia, unspecified: Secondary | ICD-10-CM | POA: Diagnosis not present

## 2021-01-30 DIAGNOSIS — E538 Deficiency of other specified B group vitamins: Secondary | ICD-10-CM | POA: Insufficient documentation

## 2021-01-30 DIAGNOSIS — Z800 Family history of malignant neoplasm of digestive organs: Secondary | ICD-10-CM | POA: Diagnosis not present

## 2021-01-30 DIAGNOSIS — C9000 Multiple myeloma not having achieved remission: Secondary | ICD-10-CM

## 2021-01-30 DIAGNOSIS — Z822 Family history of deafness and hearing loss: Secondary | ICD-10-CM | POA: Insufficient documentation

## 2021-01-30 DIAGNOSIS — E1122 Type 2 diabetes mellitus with diabetic chronic kidney disease: Secondary | ICD-10-CM | POA: Diagnosis not present

## 2021-01-30 DIAGNOSIS — Z841 Family history of disorders of kidney and ureter: Secondary | ICD-10-CM | POA: Insufficient documentation

## 2021-01-30 DIAGNOSIS — I129 Hypertensive chronic kidney disease with stage 1 through stage 4 chronic kidney disease, or unspecified chronic kidney disease: Secondary | ICD-10-CM | POA: Insufficient documentation

## 2021-01-30 DIAGNOSIS — Z79899 Other long term (current) drug therapy: Secondary | ICD-10-CM | POA: Diagnosis not present

## 2021-01-30 DIAGNOSIS — Z5112 Encounter for antineoplastic immunotherapy: Secondary | ICD-10-CM | POA: Insufficient documentation

## 2021-01-30 DIAGNOSIS — Z8379 Family history of other diseases of the digestive system: Secondary | ICD-10-CM | POA: Insufficient documentation

## 2021-01-30 DIAGNOSIS — Z818 Family history of other mental and behavioral disorders: Secondary | ICD-10-CM | POA: Insufficient documentation

## 2021-01-30 LAB — MAGNESIUM: Magnesium: 1.3 mg/dL — ABNORMAL LOW (ref 1.7–2.4)

## 2021-01-30 MED ORDER — SODIUM CHLORIDE 0.9 % IV SOLN: 6.0000 g | Freq: Once | INTRAVENOUS | Status: DC

## 2021-01-30 MED ORDER — SODIUM CHLORIDE 0.9% FLUSH
10.0000 mL | INTRAVENOUS | Status: DC | PRN
  Administered 2021-01-30: 10 mL via INTRAVENOUS
  Filled 2021-01-30: qty 10

## 2021-01-30 MED ORDER — SODIUM CHLORIDE 0.9 % IV SOLN
25.0000 mg | Freq: Once | INTRAVENOUS | Status: AC
  Administered 2021-01-30: 25 mg via INTRAVENOUS
  Filled 2021-01-30: qty 1

## 2021-01-30 MED ORDER — HEPARIN SOD (PORK) LOCK FLUSH 100 UNIT/ML IV SOLN
500.0000 [IU] | Freq: Once | INTRAVENOUS | Status: AC
  Administered 2021-01-30: 500 [IU] via INTRAVENOUS
  Filled 2021-01-30: qty 5

## 2021-01-30 MED ORDER — MAGNESIUM SULFATE 2 GM/50ML IV SOLN
2.0000 g | Freq: Once | INTRAVENOUS | Status: AC
  Administered 2021-01-30: 2 g via INTRAVENOUS
  Filled 2021-01-30: qty 50

## 2021-01-30 MED ORDER — MAGNESIUM SULFATE 4 GM/100ML IV SOLN
4.0000 g | Freq: Once | INTRAVENOUS | Status: AC
Start: 2021-01-30 — End: 2021-01-30
  Administered 2021-01-30: 4 g via INTRAVENOUS
  Filled 2021-01-30: qty 100

## 2021-01-30 MED ORDER — SODIUM CHLORIDE 0.9 % IV SOLN
Freq: Once | INTRAVENOUS | Status: AC
  Filled 2021-01-30: qty 250

## 2021-01-30 NOTE — Patient Instructions (Signed)
Magnesium Sulfate Injection What is this medication? MAGNESIUM SULFATE (mag NEE zee um SUL fate) prevents and treats low levels of magnesium in your body. It may also be used to prevent and treat seizures during pregnancy in people with high blood pressure disorders, such as preeclampsia or eclampsia. Magnesium plays an important role in maintaining the health of your muscles and nervous system. This medicine may be used for other purposes; ask your health care provider or pharmacist if you have questions. What should I tell my care team before I take this medication? They need to know if you have any of these conditions: Heart disease History of irregular heart beat Kidney disease An unusual or allergic reaction to magnesium sulfate, medications, foods, dyes, or preservatives Pregnant or trying to get pregnant Breast-feeding How should I use this medication? This medication is for infusion into a vein. It is given in a hospital or clinic setting. Talk to your care team about the use of this medication in children. While this medication may be prescribed for selected conditions, precautions do apply. Overdosage: If you think you have taken too much of this medicine contact a poison control center or emergency room at once. NOTE: This medicine is only for you. Do not share this medicine with others. What if I miss a dose? This does not apply. What may interact with this medication? Certain medications for anxiety or sleep Certain medications for seizures like phenobarbital Digoxin Medications that relax muscles for surgery Narcotic medications for pain This list may not describe all possible interactions. Give your health care provider a list of all the medicines, herbs, non-prescription drugs, or dietary supplements you use. Also tell them if you smoke, drink alcohol, or use illegal drugs. Some items may interact with your medicine. What should I watch for while using this medication? Your  condition will be monitored carefully while you are receiving this medication. You may need blood work done while you are receiving this medication. What side effects may I notice from receiving this medication? Side effects that you should report to your care team as soon as possible: Allergic reactions-skin rash, itching, hives, swelling of the face, lips, tongue, or throat High magnesium level-confusion, drowsiness, facial flushing, redness, sweating, muscle weakness, fast or irregular heartbeat, trouble breathing Low blood pressure-dizziness, feeling faint or lightheaded, blurry vision Side effects that usually do not require medical attention (report to your care team if they continue or are bothersome): Headache Nausea This list may not describe all possible side effects. Call your doctor for medical advice about side effects. You may report side effects to FDA at 1-800-FDA-1088. Where should I keep my medication? This medication is given in a hospital or clinic and will not be stored at home. NOTE: This sheet is a summary. It may not cover all possible information. If you have questions about this medicine, talk to your doctor, pharmacist, or health care provider.  2022 [employer] (2020-08-07 13:10:26)  

## 2021-01-30 NOTE — Progress Notes (Signed)
Magnesium level is 1.3 today. Per MD parameters pt will receive 6 grams of IV magnesium via her Port access over 3 hours. VSS. Pt discharged to home.

## 2021-02-02 ENCOUNTER — Inpatient Hospital Stay: Payer: Medicare HMO

## 2021-02-02 ENCOUNTER — Encounter: Payer: Self-pay | Admitting: Oncology

## 2021-02-02 ENCOUNTER — Other Ambulatory Visit: Payer: Medicare HMO

## 2021-02-02 ENCOUNTER — Other Ambulatory Visit: Payer: Self-pay

## 2021-02-02 ENCOUNTER — Inpatient Hospital Stay (HOSPITAL_BASED_OUTPATIENT_CLINIC_OR_DEPARTMENT_OTHER): Payer: Medicare HMO | Admitting: Oncology

## 2021-02-02 ENCOUNTER — Ambulatory Visit: Payer: Medicare HMO

## 2021-02-02 VITALS — BP 130/71 | HR 65 | Temp 97.8°F | Resp 16 | Wt 153.4 lb

## 2021-02-02 DIAGNOSIS — Z95828 Presence of other vascular implants and grafts: Secondary | ICD-10-CM

## 2021-02-02 DIAGNOSIS — Z5112 Encounter for antineoplastic immunotherapy: Secondary | ICD-10-CM | POA: Diagnosis not present

## 2021-02-02 DIAGNOSIS — E538 Deficiency of other specified B group vitamins: Secondary | ICD-10-CM

## 2021-02-02 DIAGNOSIS — C9001 Multiple myeloma in remission: Secondary | ICD-10-CM

## 2021-02-02 DIAGNOSIS — G620 Drug-induced polyneuropathy: Secondary | ICD-10-CM

## 2021-02-02 DIAGNOSIS — C9000 Multiple myeloma not having achieved remission: Secondary | ICD-10-CM

## 2021-02-02 DIAGNOSIS — R110 Nausea: Secondary | ICD-10-CM

## 2021-02-02 DIAGNOSIS — T451X5A Adverse effect of antineoplastic and immunosuppressive drugs, initial encounter: Secondary | ICD-10-CM | POA: Diagnosis not present

## 2021-02-02 DIAGNOSIS — K521 Toxic gastroenteritis and colitis: Secondary | ICD-10-CM | POA: Diagnosis not present

## 2021-02-02 DIAGNOSIS — D509 Iron deficiency anemia, unspecified: Secondary | ICD-10-CM | POA: Diagnosis not present

## 2021-02-02 DIAGNOSIS — R1310 Dysphagia, unspecified: Secondary | ICD-10-CM | POA: Diagnosis not present

## 2021-02-02 DIAGNOSIS — R339 Retention of urine, unspecified: Secondary | ICD-10-CM | POA: Diagnosis not present

## 2021-02-02 DIAGNOSIS — Z5111 Encounter for antineoplastic chemotherapy: Secondary | ICD-10-CM

## 2021-02-02 LAB — CBC WITH DIFFERENTIAL/PLATELET
Abs Immature Granulocytes: 0.03 10*3/uL (ref 0.00–0.07)
Basophils Absolute: 0 10*3/uL (ref 0.0–0.1)
Basophils Relative: 1 %
Eosinophils Absolute: 0.4 10*3/uL (ref 0.0–0.5)
Eosinophils Relative: 6 %
HCT: 36 % (ref 36.0–46.0)
Hemoglobin: 11.8 g/dL — ABNORMAL LOW (ref 12.0–15.0)
Immature Granulocytes: 1 %
Lymphocytes Relative: 17 %
Lymphs Abs: 1 10*3/uL (ref 0.7–4.0)
MCH: 27.4 pg (ref 26.0–34.0)
MCHC: 32.8 g/dL (ref 30.0–36.0)
MCV: 83.5 fL (ref 80.0–100.0)
Monocytes Absolute: 0.4 10*3/uL (ref 0.1–1.0)
Monocytes Relative: 7 %
Neutro Abs: 3.8 10*3/uL (ref 1.7–7.7)
Neutrophils Relative %: 68 %
Platelets: 124 10*3/uL — ABNORMAL LOW (ref 150–400)
RBC: 4.31 MIL/uL (ref 3.87–5.11)
RDW: 14 % (ref 11.5–15.5)
WBC: 5.6 10*3/uL (ref 4.0–10.5)
nRBC: 0 % (ref 0.0–0.2)

## 2021-02-02 LAB — COMPREHENSIVE METABOLIC PANEL
ALT: 29 U/L (ref 0–44)
AST: 33 U/L (ref 15–41)
Albumin: 4.6 g/dL (ref 3.5–5.0)
Alkaline Phosphatase: 88 U/L (ref 38–126)
Anion gap: 7 (ref 5–15)
BUN: 17 mg/dL (ref 8–23)
CO2: 25 mmol/L (ref 22–32)
Calcium: 9 mg/dL (ref 8.9–10.3)
Chloride: 104 mmol/L (ref 98–111)
Creatinine, Ser: 1 mg/dL (ref 0.44–1.00)
GFR, Estimated: >60 mL/min (ref >60)
Glucose, Bld: 152 mg/dL — ABNORMAL HIGH (ref 70–99)
Potassium: 4.2 mmol/L (ref 3.5–5.1)
Sodium: 136 mmol/L (ref 135–145)
Total Bilirubin: 0.5 mg/dL (ref 0.3–1.2)
Total Protein: 7.2 g/dL (ref 6.5–8.1)

## 2021-02-02 LAB — MAGNESIUM: Magnesium: 1.5 mg/dL — ABNORMAL LOW (ref 1.7–2.4)

## 2021-02-02 MED ORDER — BORTEZOMIB CHEMO SQ INJECTION 3.5 MG (2.5MG/ML)
2.0000 mg | Freq: Once | INTRAMUSCULAR | Status: AC
  Administered 2021-02-02: 2 mg via SUBCUTANEOUS
  Filled 2021-02-02: qty 0.8

## 2021-02-02 MED ORDER — SODIUM CHLORIDE 0.9 % IV SOLN: 4.0000 g | Freq: Once | INTRAVENOUS | Status: DC

## 2021-02-02 MED ORDER — HEPARIN SOD (PORK) LOCK FLUSH 100 UNIT/ML IV SOLN
500.0000 [IU] | Freq: Once | INTRAVENOUS | Status: DC
  Filled 2021-02-02: qty 5

## 2021-02-02 MED ORDER — SODIUM CHLORIDE 0.9% FLUSH
10.0000 mL | Freq: Once | INTRAVENOUS | Status: AC
  Administered 2021-02-02: 10 mL via INTRAVENOUS
  Filled 2021-02-02: qty 10

## 2021-02-02 MED ORDER — MAGNESIUM SULFATE 4 GM/100ML IV SOLN
4.0000 g | Freq: Once | INTRAVENOUS | Status: AC
  Administered 2021-02-02: 4 g via INTRAVENOUS
  Filled 2021-02-02: qty 100

## 2021-02-02 MED ORDER — SODIUM CHLORIDE 0.9 % IV SOLN
Freq: Once | INTRAVENOUS | Status: AC
Start: 2021-02-02 — End: 2021-02-02
  Filled 2021-02-02: qty 250

## 2021-02-02 MED ORDER — HEPARIN SOD (PORK) LOCK FLUSH 100 UNIT/ML IV SOLN
500.0000 [IU] | Freq: Once | INTRAVENOUS | Status: AC
  Administered 2021-02-02: 500 [IU] via INTRAVENOUS
  Filled 2021-02-02: qty 5

## 2021-02-02 MED ORDER — HEPARIN SOD (PORK) LOCK FLUSH 100 UNIT/ML IV SOLN
INTRAVENOUS | Status: AC
  Filled 2021-02-02: qty 5

## 2021-02-02 MED ORDER — PROMETHAZINE HCL 25 MG PO TABS: 25.0000 mg | ORAL_TABLET | Freq: Four times a day (QID) | ORAL | 1 refills | Status: DC | PRN

## 2021-02-02 MED ORDER — SODIUM CHLORIDE 0.9 % IV SOLN
25.0000 mg | Freq: Once | INTRAVENOUS | Status: AC
  Administered 2021-02-02: 25 mg via INTRAVENOUS
  Filled 2021-02-02: qty 1

## 2021-02-02 NOTE — Progress Notes (Signed)
Hematology Oncology Progress Note  Clinic Day:  02/02/2021   Referring physician: Glean Hess, MD  Chief Complaint: Sarah [name] is a 65 y.o. 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   Initial bone marrow revealed 80% plasma cells.  Lambda free light chains were 1340.  She had nephrotic range proteinuria.  She initially underwent induction with RVD.  Revlimid maintenance was discontinued on 01/21/2017 secondary to intolerance.     07/19/2019 Bone marrow aspirate and biopsy on  revealed a normocellular marrow with but increased lambda-restricted plasma cells (9% aspirate, 40% CD138 immunohistochemistry).  Findings were consistent with recurrent plasma cell myeloma.  Flow cytometry revealed no monoclonal B-cell or phenotypically aberrant T-cell population. Cytogenetics were 35, XX (normal).  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    Lambda light chains have been followed: 22.2 (ratio 0.56) on 07/03/2017, 30.8 (ratio 0.78) on 09/02/2017, 36.9 (ratio 0.40) on 10/21/2017, 37.4 (ratio 0.41) on 12/16/2017, 70.7(ratio 0.31)  on 02/17/2018, 64.2 (ratio 0.27) on 04/07/2018, 78.9 (ratio 0.18) on 05/26/2018, 128.8 (ratio 0.17) on 08/06/2018, 181.5 (ratio 0.13) on 10/08/2018, 130.9 (ratio 0.13) on 10/20/2018, 160.7 (ratio 0.10) on 12/09/2018, 236.6 (ratio 0.07) on 02/01/2019,  363.6 (ratio 0.04) on 03/22/2019, 404.8 (ratio 0.04) on 04/05/2019, 420.7 (ratio 0.03) on 05/24/2019, 573.4 (ratio 0.03) on 06/23/2019, 451.05 (ratio 0.02) on 08/20/2019, 47.2 (ratio 0.13) on 10/25/2019, 22.4 (ratio 0.21) on 11/25/2019, 16.5 (ratio 0.33) on 01/10/2020, 14.6 (ratio 0.34) on 02/07/2020, 13.1 (ratio 0.31) on 03/07/2020, 10.1 (ratio 0.38) on 04/10/2020, and 9.5 (ratio 0.21) on 06/19/2020.   24 hour UPEP on 06/03/2019 revealed kappa free light chains 95.76, lambda free light chains 1,260.71, and ratio 0.08.  24 hour UPEP on 08/23/2019 revealed total protein of 782 mg/24 hrs with lambda free light chains 1,084.16 mg/L and ratio of 0.10 (1.03-31.76).  M spike in urine was 46.1% (361 mg/24 hrs).    Bone survey on 04/08/2016 and 05/28/2017 revealed no definite lytic lesion seen in the visualized skeleton.  Bone survey on 11/19/2018 revealed no suspicious lucent lesions and no acute bony abnormality.  PET scan on 07/12/2019 revealed no focal metabolic activity to suggest active myeloma within the skeleton. There were no lytic lesions identified on the CT portion of the exam or soft tissue plasmacytomas. There was no evidence of multiple myeloma.    Pretreatment RBC phenotype on 09/23/2019 was positive for C, e, DUFFY B, KIDD B, M, S, and s antigen; negative for c, E, KELL, DUFFY A, KIDD A, and N antigen.    09/27/2019 - 10/25/2019; 12/09/2019 - 03/13/2020 6 cycles of daratumumab and hyaluronidase-fihj, Pomalyst, and Decadron (DPd) .  Cycle #1 was complicated by fever and neutropenia requiring admission.  Cycle #2 was complicated by pneumonia requiring admission.  Cycle #6 was complicated with an ER evaluation for an elevated lactic acid.   05/10/2020 second autologous stem cell transplant at [facility]   She underwent conditioning with melphalan 140  mg/m2.    She is s/p week #3 Velcade maintenance (began 08/31/2020 - 09/28/2020).  She has no increase in neuropathy.  She has severe aortic stenosis.   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 Cardiology is following for a possible TAVR in the future.  Echo on 07/05/2020 revealed moderate to severe aortic stenosis with an EF of 50-55%.  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 Zometa was discontinued in 01/2017.  She has chronic nausea on Phenergan.     B12 deficiency.  B12 was 254 on 04/09/2017, 295 on 08/20/2018, and 391 on 10/08/2018.  She was on oral B12.  She received B12 monthly (last 09/14/2020).  Folate was 12.1 on 01/10/2020.    She has iron deficiency.  Ferritin was 32 on 07/01/2019.  She received Venofer on 07/15/2019 and 07/22/2019.   She has hypogammaglobulinemia.  IgG was 245 on 11/25/2019.  She received monthly IVIG (07/22/021 - 03/22/2020).  She received IVIG 400 mg/kg on 01/20/2020, 200 mg/kg on 02/17/2020, and 300 mg/kg on 03/22/2020.  IVIG on 25/85/2778 was complicated by acute renal failure. IgG trough level was 418 on 02/17/2020.  10/15/2019 - 10/20/2019 She was admitted to [facility] with fever and neutropenia.  Cultures were negative.  CXR was negative. She received broad spectrum antibiotics and daily Granix.  She received IVF for acute renal insufficiency due to diarrhea and dehydration.  Creatine was 1.66 on admission and 1.12 on discharge.    03/01/2020 - 03/05/2020 admitted to [facility] from with fever and neutropenia. CXR revealed no active cardiopulmonary disease. Chest CT with contrast revealed no acute intrathoracic pathology. There were findings which could be suggestive of prior granulomatous disease. She was treated with Cefepime and Vancomycin, then switched to ciprofloxacin on 03/03/2020.   05/08/2020 - 12/05/2021The patient was admitted to [facility] from  for autologous bone marrow transplant.   She received conditioning with melphalan 140 mg/m2.  Bone marrow transplant was on 05/10/2020. The patient developed fevers daily from  05/19/2020 - 05/24/2020. Blood cultures were + for strept sanguis bacteremia.  She was treated cefepime, vancomycin, and solumedrol for possible engraftment syndrome. Cefepime was switched to ceftriaxone; she completed 2 weeks of ceftriaxone on 06/03/2020. She had chemotherapy induced diarrhea.  She experienced urinary retention.  Feb 2022 patient has been on maintenance Velcade 2 mg every 2 weeks.  Chronic diarrhea seen by gastroenterology Dr. Vicente Males and was recommended to avoid artificial sugars in her diet including Diet Coke and coffee mate.  She feels some improvement of her diarrhea frequency since the dietary modification.  GI profile PCR negative, C. difficile toxin A+ B negative, fecal calprotectin 77 12/19/2020 colonoscopy showed 2 subcentimeter polyps in the ascending colon, resected and removed.  Pathology showed tubular adenoma.  No malignancy.  Normal mucosa in the entire examined colon.  Biopsied, pathology showed colonic mucosa with no significant pathological alteration.  Negative for active inflammation and features of chronicity.  Negative for microscopic colitis, dysplasia, and malignanc  Diabetes, patient has discontinued metformin due to diarrhea and started on glipizide 2.5 mg  INTERVAL HISTORY Sarah [name] is a 65 y.o. female who has above history reviewed by me today presents for follow up visit for management of multiple myeloma Problems and complaints are listed below:  Chronic diarrhea and patient takes Lomotil.2 tablets  every 4 hours as needed.  Symptoms are stable. chronic neuropathy, stable symptoms. Patient has chronic magnesium, gets  IV magnesium twice weekly with each visit.      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   Due to  Zometa   Cardiomyopathy, idiopathic ([place]) 08/21/2017   Overview:  Septal and apical hypokinesis with ef 40%   CHF (congestive heart failure) (HCC)    CKD (chronic kidney disease) stage 3, GFR 30-59 ml/min (HCC)    Depression    Diabetes mellitus (HCC)    Dizziness    Fatty liver    Frequent falls    GERD (gastroesophageal reflux disease)    Gout    Heart murmur    History of blood transfusion    History of bone marrow transplant ([place])    History of uterine fibroid    Hx of cardiac catheterization 06/05/2016   Overview:  Normal coronaries 2017   Hypertension    Hypomagnesemia    IDA (iron deficiency anemia)    Multiple myeloma ([place])    Personal history of chemotherapy    Renal cyst    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[place]) 06/05/2016   Overview:  Sp ablation 1999    Past Surgical History:  Procedure Laterality Date   ABDOMINAL HYSTERECTOMY     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   CARDIAC ELECTROPHYSIOLOGY MAPPING AND ABLATION     CARPAL TUNNEL RELEASE Bilateral    CHOLECYSTECTOMY  2008   COLONOSCOPY WITH PROPOFOL N/A 05/07/2017   Procedure: COLONOSCOPY WITH PROPOFOL;  Surgeon: [name], MD;  Location: [facility] ENDOSCOPY;  Service: Gastroenterology;  Laterality: N/A;   COLONOSCOPY WITH PROPOFOL N/A 12/19/2020   Procedure: COLONOSCOPY WITH PROPOFOL;  Surgeon: [name], MD;  Location: [facility] ENDOSCOPY;  Service: Gastroenterology;  Laterality: N/A;   ESOPHAGOGASTRODUODENOSCOPY (EGD) WITH PROPOFOL N/A 05/07/2017   Procedure: ESOPHAGOGASTRODUODENOSCOPY (EGD) WITH PROPOFOL;  Surgeon: [name], MD;  Location: [facility] ENDOSCOPY;  Service: Gastroenterology;  Laterality: N/A;   FOOT SURGERY Bilateral    INCONTINENCE SURGERY  2009   INTERSTIM IMPLANT PLACEMENT     other     over active bladder   OTHER SURGICAL HISTORY     bladder stimulator    PARTIAL HYSTERECTOMY  03/1996   fibroids   PORTA CATH INSERTION N/A 03/10/2019   Procedure: PORTA CATH INSERTION;  Surgeon: Algernon Huxley, MD;  Location:  [place] CV LAB;  Service: Cardiovascular;  Laterality: N/A;   TONSILLECTOMY  2007    Family History  Problem Relation Age of Onset   Colon cancer Father    Renal Disease Father    Diabetes Mellitus II Father    Melanoma Paternal Grandmother    Breast cancer Maternal Aunt 33   Anemia Mother    Heart disease Mother    Heart failure Mother    Renal Disease Mother    Congestive Heart Failure Mother    Heart disease Maternal Uncle    Throat cancer Maternal Uncle    Lung cancer Maternal Uncle    Liver disease Maternal Uncle    Heart failure Maternal Uncle    Hearing loss Son 79       Suicide     Social History:  reports that she quit smoking about 29 years ago. Her smoking use included cigarettes. She has a 20.00 pack-year smoking history. She has never  used smokeless tobacco. She reports current alcohol use. She reports that she does not use drugs.  She is on disability. She notes exposure to perchloroethylene [facility]).  She lives much of her adult life in [place].   Her husband passed away.    Allergies:  Allergies  Allergen Reactions   Oxycodone-Acetaminophen Anaphylaxis    Swelling and rash   Celebrex [Celecoxib] Diarrhea   Codeine    Plerixafor     In 2016 during ASCT collection patient developed fever to 103.17F and required hospitalization   Benadryl [Diphenhydramine] Palpitations   Morphine Itching and Rash   Ondansetron Diarrhea   Tylenol [Acetaminophen] Itching and Rash    Current Medications: Current Outpatient Medications  Medication Sig Dispense Refill   acetaminophen (TYLENOL) 500 MG tablet Take 500 mg by mouth every 6 (six) hours as needed.     acyclovir (ZOVIRAX) 400 MG tablet Take 400 mg by mouth 2 (two) times daily.     allopurinol (ZYLOPRIM) 100 MG tablet TAKE 1 TABLET(100 MG) BY MOUTH DAILY as needed 90 tablet 1   bisoprolol (ZEBETA) 5 MG tablet Take 5 mg by mouth daily.     diclofenac sodium (VOLTAREN) 1 % GEL Apply 2 g topically 4 (four)  times daily. 100 g 1   diphenhydrAMINE (BENADRYL) 50 MG capsule Take 50 mg by mouth as needed.     diphenoxylate-atropine (LOMOTIL) 2.5-0.025 MG tablet Take 2 tablets by mouth 4 (four) times daily as needed for diarrhea or loose stools. 64 tablet 3   FLUoxetine (PROZAC) 40 MG capsule TAKE 1 CAPSULE EVERY DAY 90 capsule 1   fluticasone (FLONASE) 50 MCG/ACT nasal spray Place 2 sprays into both nostrils daily. 16 g 2   glucose blood test strip      loperamide (IMODIUM) 2 MG capsule Take 1 capsule (2 mg total) by mouth 4 (four) times daily as needed for diarrhea or loose stools. 120 capsule 3   methocarbamol (ROBAXIN) 500 MG tablet TAKE 1 TABLET FOUR TIMES DAILY. 120 tablet 0   Multiple Vitamin (MULTIVITAMIN) tablet Take 1 tablet by mouth daily.     Multiple Vitamins-Minerals (HAIR/SKIN/NAILS) TABS Take 1 tablet by mouth daily.     omeprazole (PRILOSEC) 40 MG capsule Take 1 capsule (40 mg total) by mouth in the morning and at bedtime. 180 capsule 1   promethazine-dextromethorphan (PROMETHAZINE-DM) 6.25-15 MG/5ML syrup Take 5 mLs by mouth 4 (four) times daily as needed for cough. 118 mL 0   traZODone (DESYREL) 100 MG tablet Take 1 tablet (100 mg total) by mouth at bedtime. 90 tablet 1   lactulose (CHRONULAC) 10 GM/15ML solution Take 20 g by mouth daily as needed. (Patient not taking: No sig reported)     lidocaine (LIDODERM) 5 % 1 patch daily. (Patient not taking: No sig reported)     promethazine (PHENERGAN) 25 MG tablet Take 1 tablet (25 mg total) by mouth every 6 (six) hours as needed for nausea or vomiting. 90 tablet 1   No current facility-administered medications for this visit.   Facility-Administered Medications Ordered in Other Visits  Medication Dose Route Frequency Provider Last Rate Last Admin   0.9 %  sodium chloride infusion   Intravenous Continuous Lequita Asal, MD   Stopped at 01/20/20 1232   0.9 %  sodium chloride infusion   Intravenous Continuous Lequita Asal, MD    Stopped at 12/19/20 0856   0.9 %  sodium chloride infusion   Intravenous Continuous Earlie Server, MD 10 mL/hr  at 11/30/20 0957 New Bag at 11/30/20 0957   heparin lock flush 100 unit/mL  500 Units Intravenous Once Faythe Casa E, NP       heparin lock flush 100 unit/mL  500 Units Intravenous Once Nolon Stalls C, MD       heparin lock flush 100 unit/mL  500 Units Intravenous Once Earlie Server, MD       sodium chloride flush (NS) 0.9 % injection 10 mL  10 mL Intravenous PRN Nolon Stalls C, MD   10 mL at 02/07/20 0815   sodium chloride flush (NS) 0.9 % injection 10 mL  10 mL Intracatheter PRN Cammie Sickle, MD   10 mL at 02/10/20 0815   sodium chloride flush (NS) 0.9 % injection 10 mL  10 mL Intravenous PRN Jacquelin Hawking, NP   10 mL at 03/01/20 1143   sodium chloride flush (NS) 0.9 % injection 10 mL  10 mL Intravenous PRN Earlie Server, MD   10 mL at 01/19/21 1044    Review of Systems  Constitutional:  Negative for chills, fever, malaise/fatigue and weight loss.  HENT:  Negative for sore throat.   Eyes:  Negative for redness.  Respiratory:  Negative for cough, shortness of breath and wheezing.   Cardiovascular:  Negative for chest pain, palpitations and leg swelling.  Gastrointestinal:  Positive for diarrhea and nausea. Negative for abdominal pain, blood in stool and vomiting.  Genitourinary:  Negative for dysuria.  Musculoskeletal:  Positive for back pain and joint pain. Negative for myalgias.  Skin:  Negative for rash.  Neurological:  Positive for tingling and sensory change. Negative for dizziness and tremors.  Endo/Heme/Allergies:  Does not bruise/bleed easily.  Psychiatric/Behavioral:  Negative for hallucinations.    Performance status (ECOG): 1  Vitals Blood pressure 130/71, pulse 65, temperature 97.8 F (36.6 C), resp. rate 16, weight 153 lb 6.4 oz (69.6 kg).  Physical Exam Vitals and nursing note reviewed.  Constitutional:      General: She is not in acute distress.     Appearance: She is well-developed. She is not ill-appearing, toxic-appearing or diaphoretic.     Interventions: Face mask in place.  HENT:     Head: Normocephalic and atraumatic.     Mouth/Throat:     Mouth: Mucous membranes are moist.     Pharynx: Oropharynx is clear.  Eyes:     General: No scleral icterus.    Pupils: Pupils are equal, round, and reactive to light.  Cardiovascular:     Rate and Rhythm: Normal rate and regular rhythm.     Heart sounds: Normal heart sounds. No murmur heard. Pulmonary:     Effort: Pulmonary effort is normal. No respiratory distress.     Breath sounds: Normal breath sounds. No wheezing or rales.  Chest:     Chest wall: No tenderness.  Breasts:    Right: No supraclavicular adenopathy.     Left: No supraclavicular adenopathy.  Abdominal:     General: Bowel sounds are normal. There is no distension.     Palpations: Abdomen is soft. There is no hepatomegaly, splenomegaly or mass.     Tenderness: There is no abdominal tenderness. There is no guarding or rebound.  Musculoskeletal:        General: No tenderness. Normal range of motion.     Cervical back: Normal range of motion and neck supple.     Right lower leg: No edema.     Left lower leg: No edema.  Lymphadenopathy:  Head:     Right side of head: No preauricular, posterior auricular or occipital adenopathy.     Left side of head: No preauricular, posterior auricular or occipital adenopathy.     Cervical: No cervical adenopathy.     Upper Body:     Right upper body: No supraclavicular adenopathy.     Left upper body: No supraclavicular adenopathy.  Skin:    General: Skin is warm and dry.  Neurological:     Mental Status: She is alert and oriented to person, place, and time. Mental status is at baseline.  Psychiatric:        Mood and Affect: Mood normal.   Laboratory data Infusion on 02/02/2021  Component Date Value Ref Range Status   Magnesium 02/02/2021 1.5 (A) 1.7 - 2.4 mg/dL Final    Performed at [facility], [address] [place], [place] [address]   WBC 02/02/2021 5.6  4.0 - 10.5 K/uL Final   RBC 02/02/2021 4.31  3.87 - 5.11 MIL/uL Final   Hemoglobin 02/02/2021 11.8 (A) 12.0 - 15.0 g/dL Final   HCT 02/02/2021 36.0  36.0 - 46.0 % Final   MCV 02/02/2021 83.5  80.0 - 100.0 fL Final   MCH 02/02/2021 27.4  26.0 - 34.0 pg Final   MCHC 02/02/2021 32.8  30.0 - 36.0 g/dL Final   RDW 02/02/2021 14.0  11.5 - 15.5 % Final   Platelets 02/02/2021 124 (A) 150 - 400 K/uL Final   nRBC 02/02/2021 0.0  0.0 - 0.2 % Final   Neutrophils Relative % 02/02/2021 68  % Final   Neutro Abs 02/02/2021 3.8  1.7 - 7.7 K/uL Final   Lymphocytes Relative 02/02/2021 17  % Final   Lymphs Abs 02/02/2021 1.0  0.7 - 4.0 K/uL Final   Monocytes Relative 02/02/2021 7  % Final   Monocytes Absolute 02/02/2021 0.4  0.1 - 1.0 K/uL Final   Eosinophils Relative 02/02/2021 6  % Final   Eosinophils Absolute 02/02/2021 0.4  0.0 - 0.5 K/uL Final   Basophils Relative 02/02/2021 1  % Final   Basophils Absolute 02/02/2021 0.0  0.0 - 0.1 K/uL Final   Immature Granulocytes 02/02/2021 1  % Final   Abs Immature Granulocytes 02/02/2021 0.03  0.00 - 0.07 K/uL Final   Performed at [facility], [address] [place],  [address]   Sodium 02/02/2021 136  135 - 145 mmol/L Final   Potassium 02/02/2021 4.2  3.5 - 5.1 mmol/L Final   Chloride 02/02/2021 104  98 - 111 mmol/L Final   CO2 02/02/2021 25  22 - 32 mmol/L Final   Glucose, Bld 02/02/2021 152 (A) 70 - 99 mg/dL Final   Glucose reference range applies only to samples taken after fasting for at least 8 hours.   BUN 02/02/2021 17  8 - 23 mg/dL Final   Creatinine, Ser 02/02/2021 1.00  0.44 - 1.00 mg/dL Final   Calcium 02/02/2021 9.0  8.9 - 10.3 mg/dL Final   Total Protein 02/02/2021 7.2  6.5 - 8.1 g/dL Final   Albumin 02/02/2021 4.6  3.5 - 5.0 g/dL Final   AST 02/02/2021 33  15 - 41 U/L Final   ALT 02/02/2021 29  0 - 44 U/L Final   Alkaline  Phosphatase 02/02/2021 88  38 - 126 U/L Final   Total Bilirubin 02/02/2021 0.5  0.3 - 1.2 mg/dL Final   GFR, Estimated 02/02/2021 >60  >60 mL/min Final   Comment: (NOTE) Calculated using the CKD-EPI  Creatinine Equation (2021)    Anion gap 02/02/2021 7  5 - 15 Final   Performed at [facility], [address], [place], [place] [address]    Assessment/Plan 1. Chronic nausea   2. Multiple myeloma in remission ([place])   3. Encounter for antineoplastic chemotherapy   4. B12 deficiency   5. Chemotherapy-induced neuropathy ([place])   6. Hypomagnesemia     # Recurrent lambda light chain multiple myeloma s/p second autologous bone marrow transplant [05/10/2020] Clinically patient is doing well.  She has been on Velcade maintenance every 2 weeks.   Insurance finally approved subcutaneous Velcade.  Labs reviewed and discussed with patient  To proceed with Velcade SQ 4m today.   Continue prophylaxis with Valtrex up to 1 year after transplant-[facility] 20[facility] ([phone].  #Hyperglobulinemia post stem cell transplantation.  Currently she has no frequent infection, hold off IVIG replacement therapy.  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  Patient prefers to be monitored clinically rather than starting neuropathy medication.  She has previously tried neuropathy medications which did not help her.  We discussed about amitriptyline however due to her being on Prozac, not able to be prescribed her amitriptyline due to drug interactions.   # iron deficiency anemia, Hemoglobin is stable at 11.8,   #chronic diarrhea, Gastroenterology recommendation was reviewed.  Avoid artificial sugar.  Colonoscopy showed no microscopic colitis. Her symptom seems to have improved slightly after dietary modification. Velcade side effects may also contribute to diarrhea  #Chronic hypomagnesemia Magnesium is 1.5 today, proceed with  4g of IV magnesium today. Off Slow-Mag 64 mg twice daily.  Per GI recommendation as this potentially precipitate her diarrhea.continue magnesium checked twice a week +/- IV magnesium  #Dysphagia, chronic nausea, patient request to have IV Phenergan in the clinic with treatment.  Refilled antiemetics. Recommend her to call GI office.   # B12 deficiency, patient has been on vitamin B12 monthly. Last dose 11/09/2020. Vitamin B12 is normal at 431.    RTC: 1 week, lab magnesium/IV magnesium twice per week,  RTC in 2 weeks for labs (CBC with diff, CMP, Mg ), md  for  Velcade and IV Mg + B12 injection.   I discussed the assessment and treatment plan with the patient.  The patient was provided an opportunity to ask questions and all were answered.  The patient agreed with the plan and demonstrated an understanding of the instructions.  The patient was advised to call back if the symptoms worsen or if the condition fails to improve as anticipated.   ZEarlie Server MD, PhD Hematology Oncology [facility]at [facility]8/10/2020

## 2021-02-02 NOTE — Progress Notes (Signed)
Patient denies new problems/concerns today.   °

## 2021-02-02 NOTE — Patient Instructions (Signed)
[facility]  Discharge Instructions: Thank you for choosing [facility] to provide your oncology and hematology care.  If you have a lab appointment with the [facility], please go directly to the [facility] and check in at the registration area.  Wear comfortable clothing and clothing appropriate for easy access to any Portacath or PICC line.   We strive to give you quality time with your provider. You may need to reschedule your appointment if you arrive late (15 or more minutes).  Arriving late affects you and other patients whose appointments are after yours.  Also, if you miss three or more appointments without notifying the office, you may be dismissed from the clinic at the provider's discretion.      For prescription refill requests, have your pharmacy contact our office and allow 72 hours for refills to be completed.    Today you received the following chemotherapy and/or immunotherapy agents - Velcade      To help prevent nausea and vomiting after your treatment, we encourage you to take your nausea medication as directed.  BELOW ARE SYMPTOMS THAT SHOULD BE REPORTED IMMEDIATELY: *FEVER GREATER THAN 100.4 F (38 C) OR HIGHER *CHILLS OR SWEATING *NAUSEA AND VOMITING THAT IS NOT CONTROLLED WITH YOUR NAUSEA MEDICATION *UNUSUAL SHORTNESS OF BREATH *UNUSUAL BRUISING OR BLEEDING *URINARY PROBLEMS (pain or burning when urinating, or frequent urination) *BOWEL PROBLEMS (unusual diarrhea, constipation, pain near the anus) TENDERNESS IN MOUTH AND THROAT WITH OR WITHOUT PRESENCE OF ULCERS (sore throat, sores in mouth, or a toothache) UNUSUAL RASH, SWELLING OR PAIN  UNUSUAL VAGINAL DISCHARGE OR ITCHING   Items with * indicate a potential emergency and should be followed up as soon as possible or go to the Emergency Department if any problems should occur.  Please show the CHEMOTHERAPY ALERT CARD or IMMUNOTHERAPY ALERT CARD at check-in  to the Emergency Department and triage nurse.  Should you have questions after your visit or need to cancel or reschedule your appointment, please contact [facility]  [phone] and follow the prompts.  Office hours are 8:00 a.m. to 4:30 p.m. Monday - Friday. Please note that voicemails left after 4:00 p.m. may not be returned until the following business day.  We are closed weekends and major holidays. You have access to a nurse at all times for urgent questions. Please call the main number to the clinic [phone] and follow the prompts.  For any non-urgent questions, you may also contact your provider using MyChart. We now offer e-Visits for anyone 64 and older to request care online for non-urgent symptoms. For details visit [email or web address].   Also download the MyChart app! Go to the app store, search "MyChart", open the app, select [facility], and log in with your MyChart username and password.  Due to Covid, a mask is required upon entering the hospital/clinic. If you do not have a mask, one will be given to you upon arrival. For doctor visits, patients may have 1 support person aged 64 or older with them. For treatment visits, patients cannot have anyone with them due to current Covid guidelines and our immunocompromised population.   Bortezomib injection What is this medication? BORTEZOMIB (bor TEZ oh mib) targets proteins in cancer cells and stops thecancer cells from growing. It treats multiple myeloma and mantle cell lymphoma. This medicine may be used for other purposes; ask your health care provider orpharmacist if you have questions. COMMON BRAND NAME(S): Velcade What should  I tell my care team before I take this medication? They need to know if you have any of these conditions: dehydration diabetes (high blood sugar) heart disease liver disease tingling of the fingers or toes or other nerve disorder an unusual or allergic reaction  to bortezomib, mannitol, boron, other medicines, foods, dyes, or preservatives pregnant or trying to get pregnant breast-feeding How should I use this medication? This medicine is injected into a vein or under the skin. It is given by ahealth care provider in a hospital or clinic setting. Talk to your health care provider about the use of this medicine in children.Special care may be needed. Overdosage: If you think you have taken too much of this medicine contact apoison control center or emergency room at once. NOTE: This medicine is only for you. Do not share this medicine with others. What if I miss a dose? Keep appointments for follow-up doses. It is important not to miss your dose.Call your health care provider if you are unable to keep an appointment. What may interact with this medication? This medicine may interact with the following medications: ketoconazole rifampin This list may not describe all possible interactions. Give your health care provider a list of all the medicines, herbs, non-prescription drugs, or dietary supplements you use. Also tell them if you smoke, drink alcohol, or use illegaldrugs. Some items may interact with your medicine. What should I watch for while using this medication? Your condition will be monitored carefully while you are receiving thismedicine. You may need blood work done while you are taking this medicine. You may get drowsy or dizzy. Do not drive, use machinery, or do anything that needs mental alertness until you know how this medicine affects you. Do not stand up or sit up quickly, especially if you are an older patient. Thisreduces the risk of dizzy or fainting spells This medicine may increase your risk of getting an infection. Call your health care provider for advice if you get a fever, chills, sore throat, or other symptoms of a cold or flu. Do not treat yourself. Try to avoid being aroundpeople who are sick. Check with your health care  provider if you have severe diarrhea, nausea, and vomiting, or if you sweat a lot. The loss of too much body fluid may make itdangerous for you to take this medicine. Do not become pregnant while taking this medicine or for 7 months after stopping it. Women should inform their health care provider if they wish to become pregnant or think they might be pregnant. Men should not father a child while taking this medicine and for 4 months after stopping it. There is a potential for serious harm to an unborn child. Talk to your health care provider for more information. Do not breast-feed an infant while taking thismedicine or for 2 months after stopping it. This medicine may make it more difficult to get pregnant or father a child.Talk to your health care provider if you are concerned about your fertility. What side effects may I notice from receiving this medication? Side effects that you should report to your doctor or health care professionalas soon as possible: allergic reactions (skin rash; itching or hives; swelling of the face, lips, or tongue) bleeding (bloody or black, tarry stools; red or dark brown urine; spitting up blood or brown material that looks like coffee grounds; red spots on the skin; unusual bruising or bleeding from the eye, gums, or nose) blurred vision or changes in vision confusion constipation headache heart  failure (trouble breathing; fast, irregular heartbeat; sudden weight gain; swelling of the ankles, feet, hands) infection (fever, chills, cough, sore throat, pain or trouble passing urine) lack or loss of appetite liver injury (dark yellow or brown urine; general ill feeling or flu-like symptoms; loss of appetite, right upper belly pain; yellowing of the eyes or skin) low blood pressure (dizziness; feeling faint or lightheaded, falls; unusually weak or tired) muscle cramps pain, redness, or irritation at site where injected pain, tingling, numbness in the hands or  feet seizures trouble breathing unusual bruising or bleeding Side effects that usually do not require medical attention (report to yourdoctor or health care professional if they continue or are bothersome): diarrhea nausea stomach pain trouble sleeping vomiting This list may not describe all possible side effects. Call your doctor for medical advice about side effects. You may report side effects to FDA at1-800-FDA-1088. Where should I keep my medication? This medicine is given in a hospital or clinic. It will not be stored at home. NOTE: This sheet is a summary. It may not cover all possible information. If you have questions about this medicine, talk to your doctor, pharmacist, orhealth care provider.  2022 Elsevier/Gold Standard (2020-06-08 13:22:53)

## 2021-02-06 ENCOUNTER — Inpatient Hospital Stay: Payer: Medicare HMO

## 2021-02-06 ENCOUNTER — Other Ambulatory Visit: Payer: Self-pay

## 2021-02-06 ENCOUNTER — Telehealth: Payer: Self-pay | Admitting: Internal Medicine

## 2021-02-06 DIAGNOSIS — C9000 Multiple myeloma not having achieved remission: Secondary | ICD-10-CM

## 2021-02-06 DIAGNOSIS — Z5112 Encounter for antineoplastic immunotherapy: Secondary | ICD-10-CM | POA: Diagnosis not present

## 2021-02-06 DIAGNOSIS — R1310 Dysphagia, unspecified: Secondary | ICD-10-CM | POA: Diagnosis not present

## 2021-02-06 DIAGNOSIS — R339 Retention of urine, unspecified: Secondary | ICD-10-CM | POA: Diagnosis not present

## 2021-02-06 DIAGNOSIS — E538 Deficiency of other specified B group vitamins: Secondary | ICD-10-CM | POA: Diagnosis not present

## 2021-02-06 DIAGNOSIS — C9001 Multiple myeloma in remission: Secondary | ICD-10-CM | POA: Diagnosis not present

## 2021-02-06 DIAGNOSIS — K521 Toxic gastroenteritis and colitis: Secondary | ICD-10-CM | POA: Diagnosis not present

## 2021-02-06 DIAGNOSIS — T451X5A Adverse effect of antineoplastic and immunosuppressive drugs, initial encounter: Secondary | ICD-10-CM | POA: Diagnosis not present

## 2021-02-06 DIAGNOSIS — G620 Drug-induced polyneuropathy: Secondary | ICD-10-CM | POA: Diagnosis not present

## 2021-02-06 DIAGNOSIS — D509 Iron deficiency anemia, unspecified: Secondary | ICD-10-CM | POA: Diagnosis not present

## 2021-02-06 LAB — MAGNESIUM: Magnesium: 1.1 mg/dL — ABNORMAL LOW (ref 1.7–2.4)

## 2021-02-06 MED ORDER — SODIUM CHLORIDE 0.9 % IV SOLN
25.0000 mg | Freq: Once | INTRAVENOUS | Status: AC
  Administered 2021-02-06: 25 mg via INTRAVENOUS
  Filled 2021-02-06: qty 1

## 2021-02-06 MED ORDER — HEPARIN SOD (PORK) LOCK FLUSH 100 UNIT/ML IV SOLN
500.0000 [IU] | Freq: Once | INTRAVENOUS | Status: AC
  Administered 2021-02-06: 500 [IU] via INTRAVENOUS
  Filled 2021-02-06: qty 5

## 2021-02-06 MED ORDER — SODIUM CHLORIDE 0.9 % IV SOLN
Freq: Once | INTRAVENOUS | Status: AC
  Filled 2021-02-06: qty 250

## 2021-02-06 MED ORDER — SODIUM CHLORIDE 0.9 % IV SOLN
6.0000 g | Freq: Once | INTRAVENOUS | Status: AC
  Administered 2021-02-06: 6 g via INTRAVENOUS
  Filled 2021-02-06: qty 2

## 2021-02-06 MED ORDER — SODIUM CHLORIDE 0.9% FLUSH
10.0000 mL | INTRAVENOUS | Status: DC | PRN
  Administered 2021-02-06: 10 mL via INTRAVENOUS
  Filled 2021-02-06: qty 10

## 2021-02-06 NOTE — Patient Instructions (Signed)
Magnesium Sulfate Injection What is this medication? MAGNESIUM SULFATE (mag NEE zee um SUL fate) prevents and treats low levels of magnesium in your body. It may also be used to prevent and treat seizures during pregnancy in people with high blood pressure disorders, such as preeclampsia or eclampsia. Magnesium plays an important role in maintaining the health of your muscles and nervous system. This medicine may be used for other purposes; ask your health care provider or pharmacist if you have questions. What should I tell my care team before I take this medication? They need to know if you have any of these conditions: Heart disease History of irregular heart beat Kidney disease An unusual or allergic reaction to magnesium sulfate, medications, foods, dyes, or preservatives Pregnant or trying to get pregnant Breast-feeding How should I use this medication? This medication is for infusion into a vein. It is given in a hospital or clinic setting. Talk to your care team about the use of this medication in children. While this medication may be prescribed for selected conditions, precautions do apply. Overdosage: If you think you have taken too much of this medicine contact a poison control center or emergency room at once. NOTE: This medicine is only for you. Do not share this medicine with others. What if I miss a dose? This does not apply. What may interact with this medication? Certain medications for anxiety or sleep Certain medications for seizures like phenobarbital Digoxin Medications that relax muscles for surgery Narcotic medications for pain This list may not describe all possible interactions. Give your health care provider a list of all the medicines, herbs, non-prescription drugs, or dietary supplements you use. Also tell them if you smoke, drink alcohol, or use illegal drugs. Some items may interact with your medicine. What should I watch for while using this medication? Your  condition will be monitored carefully while you are receiving this medication. You may need blood work done while you are receiving this medication. What side effects may I notice from receiving this medication? Side effects that you should report to your care team as soon as possible: Allergic reactions-skin rash, itching, hives, swelling of the face, lips, tongue, or throat High magnesium level-confusion, drowsiness, facial flushing, redness, sweating, muscle weakness, fast or irregular heartbeat, trouble breathing Low blood pressure-dizziness, feeling faint or lightheaded, blurry vision Side effects that usually do not require medical attention (report to your care team if they continue or are bothersome): Headache Nausea This list may not describe all possible side effects. Call your doctor for medical advice about side effects. You may report side effects to FDA at 1-800-FDA-1088. Where should I keep my medication? This medication is given in a hospital or clinic and will not be stored at home. NOTE: This sheet is a summary. It may not cover all possible information. If you have questions about this medicine, talk to your doctor, pharmacist, or health care provider.  2022 [employer] (2020-08-07 13:10:26)  

## 2021-02-06 NOTE — Telephone Encounter (Signed)
Copied from [facility] [id number]. Topic: Medicare AWV >> Feb 06, 2021 10:23 AM Cher Nakai R wrote: Reason for CRM:  Left message to cancel AWVS scheduled on August 10th and reschedule to Feb 14, 2021 at 2:40pm

## 2021-02-06 NOTE — Progress Notes (Signed)
Magnesium 1.1 today. 6 grams of magnesium given and 25 mg phenergan IV per MD parameters. Tolerated well. Discharged to home with VSS.

## 2021-02-07 ENCOUNTER — Ambulatory Visit: Payer: Medicare PPO

## 2021-02-08 ENCOUNTER — Ambulatory Visit: Payer: Medicare HMO

## 2021-02-09 ENCOUNTER — Inpatient Hospital Stay: Payer: Medicare HMO

## 2021-02-09 ENCOUNTER — Other Ambulatory Visit: Payer: Self-pay

## 2021-02-09 VITALS — BP 131/78 | HR 60 | Temp 97.1°F | Resp 18

## 2021-02-09 DIAGNOSIS — R1310 Dysphagia, unspecified: Secondary | ICD-10-CM | POA: Diagnosis not present

## 2021-02-09 DIAGNOSIS — Z5112 Encounter for antineoplastic immunotherapy: Secondary | ICD-10-CM | POA: Diagnosis not present

## 2021-02-09 DIAGNOSIS — C9001 Multiple myeloma in remission: Secondary | ICD-10-CM | POA: Diagnosis not present

## 2021-02-09 DIAGNOSIS — C9000 Multiple myeloma not having achieved remission: Secondary | ICD-10-CM

## 2021-02-09 DIAGNOSIS — E538 Deficiency of other specified B group vitamins: Secondary | ICD-10-CM | POA: Diagnosis not present

## 2021-02-09 DIAGNOSIS — D509 Iron deficiency anemia, unspecified: Secondary | ICD-10-CM | POA: Diagnosis not present

## 2021-02-09 DIAGNOSIS — R339 Retention of urine, unspecified: Secondary | ICD-10-CM | POA: Diagnosis not present

## 2021-02-09 DIAGNOSIS — K521 Toxic gastroenteritis and colitis: Secondary | ICD-10-CM | POA: Diagnosis not present

## 2021-02-09 DIAGNOSIS — T451X5A Adverse effect of antineoplastic and immunosuppressive drugs, initial encounter: Secondary | ICD-10-CM | POA: Diagnosis not present

## 2021-02-09 DIAGNOSIS — G620 Drug-induced polyneuropathy: Secondary | ICD-10-CM | POA: Diagnosis not present

## 2021-02-09 LAB — MAGNESIUM: Magnesium: 1.2 mg/dL — ABNORMAL LOW (ref 1.7–2.4)

## 2021-02-09 MED ORDER — SODIUM CHLORIDE 0.9% FLUSH
10.0000 mL | INTRAVENOUS | Status: DC | PRN
  Administered 2021-02-09: 10 mL via INTRAVENOUS
  Filled 2021-02-09: qty 10

## 2021-02-09 MED ORDER — SODIUM CHLORIDE 0.9 % IV SOLN
25.0000 mg | Freq: Once | INTRAVENOUS | Status: AC
  Administered 2021-02-09: 25 mg via INTRAVENOUS
  Filled 2021-02-09: qty 1

## 2021-02-09 MED ORDER — SODIUM CHLORIDE 0.9 % IV SOLN
6.0000 g | Freq: Once | INTRAVENOUS | Status: AC
  Administered 2021-02-09: 6 g via INTRAVENOUS
  Filled 2021-02-09: qty 12

## 2021-02-09 MED ORDER — HEPARIN SOD (PORK) LOCK FLUSH 100 UNIT/ML IV SOLN
500.0000 [IU] | Freq: Once | INTRAVENOUS | Status: AC
  Administered 2021-02-09: 500 [IU] via INTRAVENOUS
  Filled 2021-02-09: qty 5

## 2021-02-09 NOTE — Progress Notes (Signed)
Pt received 6 grams of magnesium today. Magnesium level is 1.2. Received IV phenergan as well. Discharged to home at completion of fluids.

## 2021-02-13 ENCOUNTER — Inpatient Hospital Stay: Payer: Medicare HMO

## 2021-02-13 ENCOUNTER — Ambulatory Visit: Payer: Medicare HMO

## 2021-02-13 ENCOUNTER — Other Ambulatory Visit: Payer: Medicare HMO

## 2021-02-13 ENCOUNTER — Other Ambulatory Visit: Payer: Self-pay

## 2021-02-13 DIAGNOSIS — R339 Retention of urine, unspecified: Secondary | ICD-10-CM | POA: Diagnosis not present

## 2021-02-13 DIAGNOSIS — E538 Deficiency of other specified B group vitamins: Secondary | ICD-10-CM | POA: Diagnosis not present

## 2021-02-13 DIAGNOSIS — G620 Drug-induced polyneuropathy: Secondary | ICD-10-CM | POA: Diagnosis not present

## 2021-02-13 DIAGNOSIS — R1310 Dysphagia, unspecified: Secondary | ICD-10-CM | POA: Diagnosis not present

## 2021-02-13 DIAGNOSIS — D509 Iron deficiency anemia, unspecified: Secondary | ICD-10-CM | POA: Diagnosis not present

## 2021-02-13 DIAGNOSIS — K521 Toxic gastroenteritis and colitis: Secondary | ICD-10-CM | POA: Diagnosis not present

## 2021-02-13 DIAGNOSIS — C9001 Multiple myeloma in remission: Secondary | ICD-10-CM | POA: Diagnosis not present

## 2021-02-13 DIAGNOSIS — C9000 Multiple myeloma not having achieved remission: Secondary | ICD-10-CM

## 2021-02-13 DIAGNOSIS — T451X5A Adverse effect of antineoplastic and immunosuppressive drugs, initial encounter: Secondary | ICD-10-CM | POA: Diagnosis not present

## 2021-02-13 DIAGNOSIS — Z5112 Encounter for antineoplastic immunotherapy: Secondary | ICD-10-CM | POA: Diagnosis not present

## 2021-02-13 LAB — MAGNESIUM: Magnesium: 1.3 mg/dL — ABNORMAL LOW (ref 1.7–2.4)

## 2021-02-13 MED ORDER — SODIUM CHLORIDE 0.9% FLUSH
10.0000 mL | INTRAVENOUS | Status: DC | PRN
  Administered 2021-02-13: 10 mL via INTRAVENOUS
  Filled 2021-02-13: qty 10

## 2021-02-13 MED ORDER — SODIUM CHLORIDE 0.9 % IV SOLN
Freq: Once | INTRAVENOUS | Status: AC
  Filled 2021-02-13: qty 250

## 2021-02-13 MED ORDER — SODIUM CHLORIDE 0.9 % IV SOLN
25.0000 mg | Freq: Once | INTRAVENOUS | Status: AC
  Administered 2021-02-13: 25 mg via INTRAVENOUS
  Filled 2021-02-13: qty 1

## 2021-02-13 MED ORDER — HEPARIN SOD (PORK) LOCK FLUSH 100 UNIT/ML IV SOLN
500.0000 [IU] | Freq: Once | INTRAVENOUS | Status: AC
  Administered 2021-02-13: 500 [IU] via INTRAVENOUS
  Filled 2021-02-13: qty 5

## 2021-02-13 MED ORDER — SODIUM CHLORIDE 0.9 % IV SOLN: 6.0000 g | Freq: Once | INTRAVENOUS | Status: DC

## 2021-02-13 MED ORDER — MAGNESIUM SULFATE 2 GM/50ML IV SOLN
2.0000 g | Freq: Once | INTRAVENOUS | Status: AC
  Administered 2021-02-13: 2 g via INTRAVENOUS
  Filled 2021-02-13: qty 50

## 2021-02-13 MED ORDER — MAGNESIUM SULFATE 4 GM/100ML IV SOLN
4.0000 g | Freq: Once | INTRAVENOUS | Status: AC
  Administered 2021-02-13: 4 g via INTRAVENOUS
  Filled 2021-02-13: qty 100

## 2021-02-13 NOTE — Patient Instructions (Signed)
Magnesium Sulfate Injection What is this medication? MAGNESIUM SULFATE (mag NEE zee um SUL fate) prevents and treats low levels of magnesium in your body. It may also be used to prevent and treat seizures during pregnancy in people with high blood pressure disorders, such as preeclampsia or eclampsia. Magnesium plays an important role in maintaining thehealth of your muscles and nervous system. This medicine may be used for other purposes; ask your health care provider orpharmacist if you have questions. What should I tell my care team before I take this medication? They need to know if you have any of these conditions: Heart disease History of irregular heart beat Kidney disease An unusual or allergic reaction to magnesium sulfate, medications, foods, dyes, or preservatives Pregnant or trying to get pregnant Breast-feeding How should I use this medication? This medication is for infusion into a vein. It is given in a hospital orclinic setting. Talk to your care team about the use of this medication in children. While thismedication may be prescribed for selected conditions, precautions do apply. Overdosage: If you think you have taken too much of this medicine contact apoison control center or emergency room at once. NOTE: This medicine is only for you. Do not share this medicine with others. What if I miss a dose? This does not apply. What may interact with this medication? Certain medications for anxiety or sleep Certain medications for seizures like phenobarbital Digoxin Medications that relax muscles for surgery Narcotic medications for pain This list may not describe all possible interactions. Give your health care provider a list of all the medicines, herbs, non-prescription drugs, or dietary supplements you use. Also tell them if you smoke, drink alcohol, or use illegaldrugs. Some items may interact with your medicine. What should I watch for while using this medication? Your  condition will be monitored carefully while you are receiving this medication. You may need blood work done while you are receiving thismedication. What side effects may I notice from receiving this medication? Side effects that you should report to your care team as soon as possible: Allergic reactions-skin rash, itching, hives, swelling of the face, lips, tongue, or throat High magnesium level-confusion, drowsiness, facial flushing, redness, sweating, muscle weakness, fast or irregular heartbeat, trouble breathing Low blood pressure-dizziness, feeling faint or lightheaded, blurry vision Side effects that usually do not require medical attention (report to your careteam if they continue or are bothersome): Headache Nausea This list may not describe all possible side effects. Call your doctor for medical advice about side effects. You may report side effects to FDA at1-800-FDA-1088. Where should I keep my medication? This medication is given in a hospital or clinic and will not be stored at home. NOTE: This sheet is a summary. It may not cover all possible information. If you have questions about this medicine, talk to your doctor, pharmacist, orhealth care provider.  2022 Elsevier/Gold Standard (2020-08-07 13:10:26) Magnesium Sulfate Injection What is this medication? MAGNESIUM SULFATE (mag NEE zee um SUL fate) prevents and treats low levels of magnesium in your body. It may also be used to prevent and treat seizures during pregnancy in people with high blood pressure disorders, such as preeclampsia or eclampsia. Magnesium plays an important role in maintaining thehealth of your muscles and nervous system. This medicine may be used for other purposes; ask your health care provider orpharmacist if you have questions. What should I tell my care team before I take this medication? They need to know if you have any of these conditions:  Heart disease History of irregular heart beat Kidney  disease An unusual or allergic reaction to magnesium sulfate, medications, foods, dyes, or preservatives Pregnant or trying to get pregnant Breast-feeding How should I use this medication? This medication is for infusion into a vein. It is given in a hospital orclinic setting. Talk to your care team about the use of this medication in children. While thismedication may be prescribed for selected conditions, precautions do apply. Overdosage: If you think you have taken too much of this medicine contact apoison control center or emergency room at once. NOTE: This medicine is only for you. Do not share this medicine with others. What if I miss a dose? This does not apply. What may interact with this medication? Certain medications for anxiety or sleep Certain medications for seizures like phenobarbital Digoxin Medications that relax muscles for surgery Narcotic medications for pain This list may not describe all possible interactions. Give your health care provider a list of all the medicines, herbs, non-prescription drugs, or dietary supplements you use. Also tell them if you smoke, drink alcohol, or use illegaldrugs. Some items may interact with your medicine. What should I watch for while using this medication? Your condition will be monitored carefully while you are receiving this medication. You may need blood work done while you are receiving thismedication. What side effects may I notice from receiving this medication? Side effects that you should report to your care team as soon as possible: Allergic reactions-skin rash, itching, hives, swelling of the face, lips, tongue, or throat High magnesium level-confusion, drowsiness, facial flushing, redness, sweating, muscle weakness, fast or irregular heartbeat, trouble breathing Low blood pressure-dizziness, feeling faint or lightheaded, blurry vision Side effects that usually do not require medical attention (report to your careteam if they  continue or are bothersome): Headache Nausea This list may not describe all possible side effects. Call your doctor for medical advice about side effects. You may report side effects to FDA at1-800-FDA-1088. Where should I keep my medication? This medication is given in a hospital or clinic and will not be stored at home. NOTE: This sheet is a summary. It may not cover all possible information. If you have questions about this medicine, talk to your doctor, pharmacist, orhealth care provider.  2022 Elsevier/Gold Standard (2020-08-07 13:10:26) Magnesium Sulfate Injection ?? ??? ??????? ??????? ?????????? ?? ??? ????????? ?????? ???? ???? ?? ???? ??? ??????? ?????????? ?? ???. ??? ?????? ??????? ?? ?? ??????? ??? ????? ?????? ?????????? ?????? ?????? ?? ??????? ?? ?????-???????. ???? ??????? ??? ?????? ?????? ????? ???? ???? ??????? ?????? ?? ??????? ??????? ???? ?????. ?? ?? ??????? ???? ??? ?? ???? ??? ???? ??????? ?????? ??? ????? ??? ??????? ?? ????? ??? ????? ?? ??? ??? ?????? ??? ?? ??? ??????? ????: ????? ????? ????? ?? ??? ?????? ????? ????? ????? ????? ?? ??? ??? ???? ?? ?????? ?? ??????? ?????????? ?? ????? ???? ?? ??? ??? ???? ?? ?????? ?? ??????? ?? ??????? ?? ?????? ??????? ???? ?? ????? ????? ??????? ???????? ??? ??? ???? ??????? ??? ??????? ??? ?????? ???? ??????? ???? ????. ??? ????? ??? ?????? ?? ???? ?????? ????????? ?? ?????? ?? ?????. ???? ??? ???? ??????? ???? ??????? ??? ?????? ???????. ?? ??? ?? ??? ?????????? ???? ?????? ?????? ??? ??? ???? ????? ?????????? ???????. ?????? ??????? : ??? ?????? ??? ?????? ???? ????? ???? ?? ??? ??????? ????????? ?????? ?? ?????? ?? ???? ??????? ?????. ??????: ?????? ??? ?????? ?? ???? ??? ???. ?? ????? ????? ??? ?? ????? ?????????. ???? ?? ???? (????) ????? ?? ????? ???. ?? ?? ??????? ???? ?? ?????? ?? ??? ??????? ??? ?????? ???? ?? ?????? ?? ??????? ???????: ??? ????? ???? ????? ?? ????? ????? ??? ????? ???? ????? ????? ???  ???????????? ???????? ??????? ???? ???? ??????? ??????? ?????? ????? ??????? ??? ??????? ?? ?? ??? ?? ????????? ????????. ?? ?????? ???? ??????? ?????? ????? ??? ??????? ?? ??????? ?? ??????? ???? ???? ?? ???????? ???????? ???? ????????. ?????? ????? ??? ??? ???? ?? ???? ?????? ?? ?????? ?????? ??????????. ?? ?????? ??? ??????? ?? ?????. ?? ???? ??? ???? ????? ??? ??????? ??? ??????? ??? ??? ??????? ?? ??? ????? ???? ????? ??????. ?? ????? ???? ???? ????? ???????? ??????. ?? ?? ?????? ???????? ???? ???? ?? ??????? ??? ???? ??? ??????? ?????? ???????? ???? ??? ?? ???? ????? ?? ?????? ??????? ?????? ??? ?? ???? ???????: ???????? ??? ????? ?????? ?????? ??????????? (?????) ???? ????? ?? ?????? ?? ??????. ?????? ????? ??? ??????? ?????? ?????? ?????? ??? ????? ??? ??????? ??????? ??????? ?? ???????? ???????? ?????? ?? ????? ??? ??????? ?????? ?????? ?????? ?????? ?? ????? ????? ?? ??? ???? ?????? ??? ??? ?????? ??????? ???? ?? ??? ?????? ????? ?????? ???????? ????? ?????? ??? ??? ??????? ?? ?? ??? ?? ?????? ???????? ????????. ???? ?????? ????????? ?????? ?? ?????? ????????. ????? ??????? ?? ?????? ???????? ?????? ??????? ????????????????? (  FDA) ??? ????? ?.[phone]??? ??? ??? ???? ???????? ??????? ??? ????? ??? ?????? ?? ???? ?????? ????? ???? ?? ?????? ?? ?????. ?? ????????? ??? ?????? ??????? ?? ??????. ??????: ??? ??????? ????? ?? ????: ?? ?? ???? ??? ???????? ?? ????????? ???????. ??? ???? ???? ?? ????? ?? ??? ??????? ????? ??? ?????? ?? ??????? ?????? ??????? ??????.  2022 Elsevier/Gold Standard (2016-07-18 00:00:00)

## 2021-02-13 NOTE — Progress Notes (Signed)
Received 3 hours of IV magnesium plus phenergan for nausea. Tolerated well. Discharged to home.

## 2021-02-14 ENCOUNTER — Ambulatory Visit: Payer: Medicare HMO

## 2021-02-16 ENCOUNTER — Encounter: Payer: Self-pay | Admitting: Internal Medicine

## 2021-02-16 ENCOUNTER — Inpatient Hospital Stay: Payer: Medicare HMO

## 2021-02-16 ENCOUNTER — Encounter: Payer: Self-pay | Admitting: Oncology

## 2021-02-16 ENCOUNTER — Other Ambulatory Visit: Payer: Self-pay

## 2021-02-16 ENCOUNTER — Inpatient Hospital Stay (HOSPITAL_BASED_OUTPATIENT_CLINIC_OR_DEPARTMENT_OTHER): Payer: Medicare HMO | Admitting: Oncology

## 2021-02-16 ENCOUNTER — Encounter: Payer: Self-pay | Admitting: Hematology and Oncology

## 2021-02-16 VITALS — BP 124/72 | HR 68 | Temp 97.2°F | Resp 18 | Wt 153.4 lb

## 2021-02-16 DIAGNOSIS — T451X5A Adverse effect of antineoplastic and immunosuppressive drugs, initial encounter: Secondary | ICD-10-CM | POA: Diagnosis not present

## 2021-02-16 DIAGNOSIS — C9000 Multiple myeloma not having achieved remission: Secondary | ICD-10-CM

## 2021-02-16 DIAGNOSIS — Z9484 Stem cells transplant status: Secondary | ICD-10-CM

## 2021-02-16 DIAGNOSIS — D509 Iron deficiency anemia, unspecified: Secondary | ICD-10-CM | POA: Diagnosis not present

## 2021-02-16 DIAGNOSIS — R1310 Dysphagia, unspecified: Secondary | ICD-10-CM | POA: Diagnosis not present

## 2021-02-16 DIAGNOSIS — R339 Retention of urine, unspecified: Secondary | ICD-10-CM | POA: Diagnosis not present

## 2021-02-16 DIAGNOSIS — E538 Deficiency of other specified B group vitamins: Secondary | ICD-10-CM | POA: Diagnosis not present

## 2021-02-16 DIAGNOSIS — Z5112 Encounter for antineoplastic immunotherapy: Secondary | ICD-10-CM | POA: Diagnosis not present

## 2021-02-16 DIAGNOSIS — K521 Toxic gastroenteritis and colitis: Secondary | ICD-10-CM | POA: Diagnosis not present

## 2021-02-16 DIAGNOSIS — D649 Anemia, unspecified: Secondary | ICD-10-CM | POA: Diagnosis not present

## 2021-02-16 DIAGNOSIS — G620 Drug-induced polyneuropathy: Secondary | ICD-10-CM

## 2021-02-16 DIAGNOSIS — C9001 Multiple myeloma in remission: Secondary | ICD-10-CM

## 2021-02-16 DIAGNOSIS — R110 Nausea: Secondary | ICD-10-CM | POA: Diagnosis not present

## 2021-02-16 DIAGNOSIS — Z5111 Encounter for antineoplastic chemotherapy: Secondary | ICD-10-CM

## 2021-02-16 LAB — COMPREHENSIVE METABOLIC PANEL
ALT: 32 U/L (ref 0–44)
AST: 28 U/L (ref 15–41)
Albumin: 4.3 g/dL (ref 3.5–5.0)
Alkaline Phosphatase: 91 U/L (ref 38–126)
Anion gap: 10 (ref 5–15)
BUN: 23 mg/dL (ref 8–23)
CO2: 23 mmol/L (ref 22–32)
Calcium: 9 mg/dL (ref 8.9–10.3)
Chloride: 104 mmol/L (ref 98–111)
Creatinine, Ser: 1.06 mg/dL — ABNORMAL HIGH (ref 0.44–1.00)
GFR, Estimated: 59 mL/min — ABNORMAL LOW (ref >60)
Glucose, Bld: 144 mg/dL — ABNORMAL HIGH (ref 70–99)
Potassium: 4.1 mmol/L (ref 3.5–5.1)
Sodium: 137 mmol/L (ref 135–145)
Total Bilirubin: 0.5 mg/dL (ref 0.3–1.2)
Total Protein: 7.3 g/dL (ref 6.5–8.1)

## 2021-02-16 LAB — RETIC PANEL
Immature Retic Fract: 17 % — ABNORMAL HIGH (ref 2.3–15.9)
RBC.: 4.23 MIL/uL (ref 3.87–5.11)
Retic Count, Absolute: 63.5 10*3/uL (ref 19.0–186.0)
Retic Ct Pct: 1.5 % (ref 0.4–3.1)
Reticulocyte Hemoglobin: 30.6 pg (ref >27.9)

## 2021-02-16 LAB — CBC WITH DIFFERENTIAL/PLATELET
Abs Immature Granulocytes: 0.03 10*3/uL (ref 0.00–0.07)
Basophils Absolute: 0 10*3/uL (ref 0.0–0.1)
Basophils Relative: 0 %
Eosinophils Absolute: 0.4 10*3/uL (ref 0.0–0.5)
Eosinophils Relative: 7 %
HCT: 36 % (ref 36.0–46.0)
Hemoglobin: 11.7 g/dL — ABNORMAL LOW (ref 12.0–15.0)
Immature Granulocytes: 0 %
Lymphocytes Relative: 16 %
Lymphs Abs: 1.1 10*3/uL (ref 0.7–4.0)
MCH: 27.5 pg (ref 26.0–34.0)
MCHC: 32.5 g/dL (ref 30.0–36.0)
MCV: 84.7 fL (ref 80.0–100.0)
Monocytes Absolute: 0.5 10*3/uL (ref 0.1–1.0)
Monocytes Relative: 8 %
Neutro Abs: 4.6 10*3/uL (ref 1.7–7.7)
Neutrophils Relative %: 69 %
Platelets: 119 10*3/uL — ABNORMAL LOW (ref 150–400)
RBC: 4.25 MIL/uL (ref 3.87–5.11)
RDW: 13.6 % (ref 11.5–15.5)
WBC: 6.7 10*3/uL (ref 4.0–10.5)
nRBC: 0 % (ref 0.0–0.2)

## 2021-02-16 LAB — MAGNESIUM: Magnesium: 1.6 mg/dL — ABNORMAL LOW (ref 1.7–2.4)

## 2021-02-16 LAB — IRON AND TIBC
Iron: 58 ug/dL (ref 28–170)
Saturation Ratios: 16 % (ref 10.4–31.8)
TIBC: 367 ug/dL (ref 250–450)
UIBC: 309 ug/dL

## 2021-02-16 LAB — FERRITIN: Ferritin: 47 ng/mL (ref 11–307)

## 2021-02-16 MED ORDER — MAGNESIUM SULFATE 2 GM/50ML IV SOLN
2.0000 g | Freq: Once | INTRAVENOUS | Status: AC
  Administered 2021-02-16: 2 g via INTRAVENOUS
  Filled 2021-02-16: qty 50

## 2021-02-16 MED ORDER — BORTEZOMIB CHEMO SQ INJECTION 3.5 MG (2.5MG/ML)
2.0000 mg | Freq: Once | INTRAMUSCULAR | Status: AC
  Administered 2021-02-16: 2 mg via SUBCUTANEOUS
  Filled 2021-02-16: qty 0.8

## 2021-02-16 MED ORDER — SODIUM CHLORIDE 0.9 % IV SOLN: 200.0000 mg | Freq: Once | INTRAVENOUS | Status: DC

## 2021-02-16 MED ORDER — HEPARIN SOD (PORK) LOCK FLUSH 100 UNIT/ML IV SOLN
500.0000 [IU] | Freq: Once | INTRAVENOUS | Status: AC
  Filled 2021-02-16: qty 5

## 2021-02-16 MED ORDER — SODIUM CHLORIDE 0.9% FLUSH
10.0000 mL | Freq: Once | INTRAVENOUS | Status: AC
  Filled 2021-02-16: qty 10

## 2021-02-16 MED ORDER — SODIUM CHLORIDE 0.9% FLUSH
10.0000 mL | INTRAVENOUS | Status: DC | PRN
  Administered 2021-02-16: 10 mL via INTRAVENOUS
  Filled 2021-02-16: qty 10

## 2021-02-16 MED ORDER — SODIUM CHLORIDE 0.9 % IV SOLN
25.0000 mg | Freq: Once | INTRAVENOUS | Status: AC
  Administered 2021-02-16: 25 mg via INTRAVENOUS
  Filled 2021-02-16: qty 1

## 2021-02-16 MED ORDER — SODIUM CHLORIDE 0.9% FLUSH
10.0000 mL | Freq: Once | INTRAVENOUS | Status: DC
  Filled 2021-02-16: qty 10

## 2021-02-16 MED ORDER — IRON SUCROSE 20 MG/ML IV SOLN
200.0000 mg | Freq: Once | INTRAVENOUS | Status: AC
  Administered 2021-02-16: 200 mg via INTRAVENOUS
  Filled 2021-02-16: qty 10

## 2021-02-16 MED ORDER — HEPARIN SOD (PORK) LOCK FLUSH 100 UNIT/ML IV SOLN
INTRAVENOUS | Status: AC
  Administered 2021-02-16: 500 [IU] via INTRAVENOUS
  Filled 2021-02-16: qty 5

## 2021-02-16 MED ORDER — GABAPENTIN 300 MG PO CAPS: 300.0000 mg | ORAL_CAPSULE | Freq: Two times a day (BID) | ORAL | 0 refills | Status: DC

## 2021-02-16 MED ORDER — SODIUM CHLORIDE 0.9 % IV SOLN
Freq: Once | INTRAVENOUS | Status: AC
  Filled 2021-02-16: qty 250

## 2021-02-16 NOTE — Progress Notes (Signed)
Hematology Oncology Progress Note  Clinic Day:  02/16/2021   Referring physician: Glean Hess, MD  Chief Complaint: Sarah [name] is a 65 y.o. 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   Initial bone marrow revealed 80% plasma cells.  Lambda free light chains were 1340.  She had nephrotic range proteinuria.  She initially underwent induction with RVD.  Revlimid maintenance was discontinued on 01/21/2017 secondary to intolerance.     07/19/2019 Bone marrow aspirate and biopsy on  revealed a normocellular marrow with but increased lambda-restricted plasma cells (9% aspirate, 40% CD138 immunohistochemistry).  Findings were consistent with recurrent plasma cell myeloma.  Flow cytometry revealed no monoclonal B-cell or phenotypically aberrant T-cell population. Cytogenetics were 52, XX (normal).  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    Lambda light chains have been followed: 22.2 (ratio 0.56) on 07/03/2017, 30.8 (ratio 0.78) on 09/02/2017, 36.9 (ratio 0.40) on 10/21/2017, 37.4 (ratio 0.41) on 12/16/2017, 70.7(ratio 0.31)  on 02/17/2018, 64.2 (ratio 0.27) on 04/07/2018, 78.9 (ratio 0.18) on 05/26/2018, 128.8 (ratio 0.17) on 08/06/2018, 181.5 (ratio 0.13) on 10/08/2018, 130.9 (ratio 0.13) on 10/20/2018, 160.7 (ratio 0.10) on 12/09/2018, 236.6 (ratio 0.07) on 02/01/2019,  363.6 (ratio 0.04) on 03/22/2019, 404.8 (ratio 0.04) on 04/05/2019, 420.7 (ratio 0.03) on 05/24/2019, 573.4 (ratio 0.03) on 06/23/2019, 451.05 (ratio 0.02) on 08/20/2019, 47.2 (ratio 0.13) on 10/25/2019, 22.4 (ratio 0.21) on 11/25/2019, 16.5 (ratio 0.33) on 01/10/2020, 14.6 (ratio 0.34) on 02/07/2020, 13.1 (ratio 0.31) on 03/07/2020, 10.1 (ratio 0.38) on 04/10/2020, and 9.5 (ratio 0.21) on 06/19/2020.   24 hour UPEP on 06/03/2019 revealed kappa free light chains 95.76, lambda free light chains 1,260.71, and ratio 0.08.  24 hour UPEP on 08/23/2019 revealed total protein of 782 mg/24 hrs with lambda free light chains 1,084.16 mg/L and ratio of 0.10 (1.03-31.76).  M spike in urine was 46.1% (361 mg/24 hrs).    Bone survey on 04/08/2016 and 05/28/2017 revealed no definite lytic lesion seen in the visualized skeleton.  Bone survey on 11/19/2018 revealed no suspicious lucent lesions and no acute bony abnormality.  PET scan on 07/12/2019 revealed no focal metabolic activity to suggest active myeloma within the skeleton. There were no lytic lesions identified on the CT portion of the exam or soft tissue plasmacytomas. There was no evidence of multiple myeloma.    Pretreatment RBC phenotype on 09/23/2019 was positive for C, e, DUFFY B, KIDD B, M, S, and s antigen; negative for c, E, KELL, DUFFY A, KIDD A, and N antigen.    09/27/2019 - 10/25/2019; 12/09/2019 - 03/13/2020 6 cycles of daratumumab and hyaluronidase-fihj, Pomalyst, and Decadron (DPd) .  Cycle #1 was complicated by fever and neutropenia requiring admission.  Cycle #2 was complicated by pneumonia requiring admission.  Cycle #6 was complicated with an ER evaluation for an elevated lactic acid.   05/10/2020 second autologous stem cell transplant at [facility]   She underwent conditioning with melphalan 140  mg/m2.    She is s/p week #3 Velcade maintenance (began 08/31/2020 - 09/28/2020).  She has no increase in neuropathy.  She has severe aortic stenosis.   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 Cardiology is following for a possible TAVR in the future.  Echo on 07/05/2020 revealed moderate to severe aortic stenosis with an EF of 50-55%.  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 Zometa was discontinued in 01/2017.  She has chronic nausea on Phenergan.     B12 deficiency.  B12 was 254 on 04/09/2017, 295 on 08/20/2018, and 391 on 10/08/2018.  She was on oral B12.  She received B12 monthly (last 09/14/2020).  Folate was 12.1 on 01/10/2020.    She has iron deficiency.  Ferritin was 32 on 07/01/2019.  She received Venofer on 07/15/2019 and 07/22/2019.   She has hypogammaglobulinemia.  IgG was 245 on 11/25/2019.  She received monthly IVIG (07/22/021 - 03/22/2020).  She received IVIG 400 mg/kg on 01/20/2020, 200 mg/kg on 02/17/2020, and 300 mg/kg on 03/22/2020.  IVIG on 12/29/1599 was complicated by acute renal failure. IgG trough level was 418 on 02/17/2020.  10/15/2019 - 10/20/2019 She was admitted to [facility] with fever and neutropenia.  Cultures were negative.  CXR was negative. She received broad spectrum antibiotics and daily Granix.  She received IVF for acute renal insufficiency due to diarrhea and dehydration.  Creatine was 1.66 on admission and 1.12 on discharge.    03/01/2020 - 03/05/2020 admitted to [facility] from with fever and neutropenia. CXR revealed no active cardiopulmonary disease. Chest CT with contrast revealed no acute intrathoracic pathology. There were findings which could be suggestive of prior granulomatous disease. She was treated with Cefepime and Vancomycin, then switched to ciprofloxacin on 03/03/2020.   05/08/2020 - 12/05/2021The patient was admitted to [facility] from  for autologous bone marrow transplant.   She received conditioning with melphalan 140 mg/m2.  Bone marrow transplant was on 05/10/2020. The patient developed fevers daily from  05/19/2020 - 05/24/2020. Blood cultures were + for strept sanguis bacteremia.  She was treated cefepime, vancomycin, and solumedrol for possible engraftment syndrome. Cefepime was switched to ceftriaxone; she completed 2 weeks of ceftriaxone on 06/03/2020. She had chemotherapy induced diarrhea.  She experienced urinary retention.  Feb 2022 patient has been on maintenance Velcade 2 mg every 2 weeks.  Chronic diarrhea seen by gastroenterology Dr. Vicente Males and was recommended to avoid artificial sugars in her diet including Diet Coke and coffee mate.  She feels some improvement of her diarrhea frequency since the dietary modification.  GI profile PCR negative, C. difficile toxin A+ B negative, fecal calprotectin 77 12/19/2020 colonoscopy showed 2 subcentimeter polyps in the ascending colon, resected and removed.  Pathology showed tubular adenoma.  No malignancy.  Normal mucosa in the entire examined colon.  Biopsied, pathology showed colonic mucosa with no significant pathological alteration.  Negative for active inflammation and features of chronicity.  Negative for microscopic colitis, dysplasia, and malignanc  Diabetes, patient has discontinued metformin due to diarrhea and started on glipizide 2.5 mg  INTERVAL HISTORY Sarah [name] is a 65 y.o. female who has above history reviewed by me today presents for follow up visit for management of multiple myeloma Problems and complaints are listed below:  Chronic diarrhea and patient takes Lomotil.2 tablets  every 4 hours as needed. Symptom is stable.  Chronic neuropathy is getting worse and she would like  to try gabapentin.  Chronic magnesium, she gets IV magnesium twice weekly with each visit.        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   Due to Zometa   Cardiomyopathy, idiopathic ([place]) 08/21/2017   Overview:  Septal and apical hypokinesis with ef 40%   CHF (congestive heart failure) (HCC)    CKD (chronic kidney disease) stage 3, GFR 30-59 ml/min (HCC)    Depression    Diabetes mellitus (HCC)    Dizziness    Fatty liver    Frequent falls    GERD (gastroesophageal reflux disease)    Gout    Heart murmur    History of blood transfusion    History of bone marrow transplant ([place])    History of uterine fibroid    Hx of cardiac catheterization 06/05/2016   Overview:  Normal coronaries 2017   Hypertension    Hypomagnesemia    IDA (iron deficiency anemia)    Multiple myeloma ([place])    Personal history of chemotherapy    Renal cyst    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[place]) 06/05/2016   Overview:  Sp ablation 1999    Past Surgical History:  Procedure Laterality Date   ABDOMINAL HYSTERECTOMY     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   CARDIAC ELECTROPHYSIOLOGY MAPPING AND ABLATION     CARPAL TUNNEL RELEASE Bilateral    CHOLECYSTECTOMY  2008   COLONOSCOPY WITH PROPOFOL N/A 05/07/2017   Procedure: COLONOSCOPY WITH PROPOFOL;  Surgeon: [name], MD;  Location: [facility] ENDOSCOPY;  Service: Gastroenterology;  Laterality: N/A;   COLONOSCOPY WITH PROPOFOL N/A 12/19/2020   Procedure: COLONOSCOPY WITH PROPOFOL;  Surgeon: [name], MD;  Location: [facility] ENDOSCOPY;  Service: Gastroenterology;  Laterality: N/A;   ESOPHAGOGASTRODUODENOSCOPY (EGD) WITH PROPOFOL N/A 05/07/2017   Procedure: ESOPHAGOGASTRODUODENOSCOPY (EGD) WITH PROPOFOL;  Surgeon: [name], MD;  Location: [facility] ENDOSCOPY;  Service: Gastroenterology;  Laterality: N/A;   FOOT SURGERY Bilateral    INCONTINENCE SURGERY  2009   INTERSTIM IMPLANT PLACEMENT     other     over active bladder   OTHER SURGICAL HISTORY     bladder stimulator    PARTIAL HYSTERECTOMY  03/1996   fibroids   PORTA CATH INSERTION N/A 03/10/2019   Procedure: PORTA CATH INSERTION;  Surgeon:  Algernon Huxley, MD;  Location: [place] CV LAB;  Service: Cardiovascular;  Laterality: N/A;   TONSILLECTOMY  2007    Family History  Problem Relation Age of Onset   Colon cancer Father    Renal Disease Father    Diabetes Mellitus II Father    Melanoma Paternal Grandmother    Breast cancer Maternal Aunt 77   Anemia Mother    Heart disease Mother    Heart failure Mother    Renal Disease Mother    Congestive Heart Failure Mother    Heart disease Maternal Uncle    Throat cancer Maternal Uncle    Lung cancer Maternal Uncle    Liver disease Maternal Uncle    Heart failure Maternal Uncle    Hearing loss Son 41       Suicide     Social History:  reports that she quit smoking about 29 years ago. Her smoking use included cigarettes.  She has a 20.00 pack-year smoking history. She has never used smokeless tobacco. She reports current alcohol use. She reports that she does not use drugs.  She is on disability. She notes exposure to perchloroethylene [facility]).  She lives much of her adult life in [place].   Her husband passed away.    Allergies:  Allergies  Allergen Reactions   Oxycodone-Acetaminophen Anaphylaxis    Swelling and rash   Celebrex [Celecoxib] Diarrhea   Codeine    Plerixafor     In 2016 during ASCT collection patient developed fever to 103.41F and required hospitalization   Benadryl [Diphenhydramine] Palpitations   Morphine Itching and Rash   Ondansetron Diarrhea   Tylenol [Acetaminophen] Itching and Rash    Current Medications: Current Outpatient Medications  Medication Sig Dispense Refill   acetaminophen (TYLENOL) 500 MG tablet Take 500 mg by mouth every 6 (six) hours as needed.     acyclovir (ZOVIRAX) 400 MG tablet Take 400 mg by mouth 2 (two) times daily.     allopurinol (ZYLOPRIM) 100 MG tablet TAKE 1 TABLET(100 MG) BY MOUTH DAILY as needed 90 tablet 1   bisoprolol (ZEBETA) 5 MG tablet Take 5 mg by mouth daily.     diphenhydrAMINE (BENADRYL) 50 MG  capsule Take 50 mg by mouth as needed.     diphenoxylate-atropine (LOMOTIL) 2.5-0.025 MG tablet Take 2 tablets by mouth 4 (four) times daily as needed for diarrhea or loose stools. 64 tablet 3   FLUoxetine (PROZAC) 40 MG capsule TAKE 1 CAPSULE EVERY DAY 90 capsule 1   fluticasone (FLONASE) 50 MCG/ACT nasal spray Place 2 sprays into both nostrils daily. 16 g 2   glucose blood test strip      methocarbamol (ROBAXIN) 500 MG tablet TAKE 1 TABLET FOUR TIMES DAILY. 120 tablet 0   Multiple Vitamin (MULTIVITAMIN) tablet Take 1 tablet by mouth daily.     Multiple Vitamins-Minerals (HAIR/SKIN/NAILS) TABS Take 1 tablet by mouth daily.     omeprazole (PRILOSEC) 40 MG capsule Take 1 capsule (40 mg total) by mouth in the morning and at bedtime. 180 capsule 1   promethazine (PHENERGAN) 25 MG tablet Take 1 tablet (25 mg total) by mouth every 6 (six) hours as needed for nausea or vomiting. 90 tablet 1   traZODone (DESYREL) 100 MG tablet Take 1 tablet (100 mg total) by mouth at bedtime. 90 tablet 1   diclofenac sodium (VOLTAREN) 1 % GEL Apply 2 g topically 4 (four) times daily. (Patient not taking: Reported on 02/16/2021) 100 g 1   lactulose (CHRONULAC) 10 GM/15ML solution Take 20 g by mouth daily as needed. (Patient not taking: No sig reported)     lidocaine (LIDODERM) 5 % 1 patch daily. (Patient not taking: No sig reported)     loperamide (IMODIUM) 2 MG capsule Take 1 capsule (2 mg total) by mouth 4 (four) times daily as needed for diarrhea or loose stools. (Patient not taking: Reported on 02/16/2021) 120 capsule 3   promethazine-dextromethorphan (PROMETHAZINE-DM) 6.25-15 MG/5ML syrup Take 5 mLs by mouth 4 (four) times daily as needed for cough. (Patient not taking: Reported on 02/16/2021) 118 mL 0   No current facility-administered medications for this visit.   Facility-Administered Medications Ordered in Other Visits  Medication Dose Route Frequency Provider Last Rate Last Admin   0.9 %  sodium chloride infusion    Intravenous Continuous Lequita Asal, MD   Stopped at 01/20/20 1232   0.9 %  sodium chloride infusion   Intravenous  Continuous Lequita Asal, MD   Stopped at 12/19/20 0856   0.9 %  sodium chloride infusion   Intravenous Continuous Earlie Server, MD 10 mL/hr at 11/30/20 0957 New Bag at 11/30/20 0957   heparin lock flush 100 unit/mL  500 Units Intravenous Once Faythe Casa E, NP       heparin lock flush 100 unit/mL  500 Units Intravenous Once Lequita Asal, MD       heparin lock flush 100 unit/mL  500 Units Intravenous Once Earlie Server, MD       heparin lock flush 100 unit/mL  500 Units Intravenous Once Earlie Server, MD       sodium chloride flush (NS) 0.9 % injection 10 mL  10 mL Intravenous PRN Nolon Stalls C, MD   10 mL at 02/07/20 0815   sodium chloride flush (NS) 0.9 % injection 10 mL  10 mL Intracatheter PRN Cammie Sickle, MD   10 mL at 02/10/20 0815   sodium chloride flush (NS) 0.9 % injection 10 mL  10 mL Intravenous PRN Jacquelin Hawking, NP   10 mL at 03/01/20 1143   sodium chloride flush (NS) 0.9 % injection 10 mL  10 mL Intravenous PRN Earlie Server, MD   10 mL at 01/19/21 1044   sodium chloride flush (NS) 0.9 % injection 10 mL  10 mL Intravenous PRN Earlie Server, MD   10 mL at 02/16/21 0825    Review of Systems  Constitutional:  Negative for chills, fever, malaise/fatigue and weight loss.  HENT:  Negative for sore throat.   Eyes:  Negative for redness.  Respiratory:  Negative for cough, shortness of breath and wheezing.   Cardiovascular:  Negative for chest pain, palpitations and leg swelling.  Gastrointestinal:  Positive for diarrhea and nausea. Negative for abdominal pain, blood in stool and vomiting.  Genitourinary:  Negative for dysuria.  Musculoskeletal:  Positive for back pain and joint pain. Negative for myalgias.  Skin:  Negative for rash.  Neurological:  Positive for tingling and sensory change. Negative for dizziness and tremors.  Endo/Heme/Allergies:   Does not bruise/bleed easily.  Psychiatric/Behavioral:  Negative for hallucinations.    Performance status (ECOG): 1  Vitals Blood pressure 124/72, pulse 68, temperature (!) 97.2 F (36.2 C), resp. rate 18, weight 153 lb 6.4 oz (69.6 kg).  Physical Exam Vitals and nursing note reviewed.  Constitutional:      General: She is not in acute distress.    Appearance: She is well-developed. She is not ill-appearing, toxic-appearing or diaphoretic.     Interventions: Face mask in place.  HENT:     Head: Normocephalic and atraumatic.     Mouth/Throat:     Mouth: Mucous membranes are moist.     Pharynx: Oropharynx is clear.  Eyes:     General: No scleral icterus.    Pupils: Pupils are equal, round, and reactive to light.  Cardiovascular:     Rate and Rhythm: Normal rate and regular rhythm.     Heart sounds: Normal heart sounds. No murmur heard. Pulmonary:     Effort: Pulmonary effort is normal. No respiratory distress.     Breath sounds: Normal breath sounds. No wheezing or rales.  Chest:     Chest wall: No tenderness.  Abdominal:     General: Bowel sounds are normal. There is no distension.     Palpations: Abdomen is soft. There is no hepatomegaly, splenomegaly or mass.     Tenderness: There is no  abdominal tenderness. There is no guarding or rebound.  Musculoskeletal:        General: No tenderness. Normal range of motion.     Cervical back: Normal range of motion and neck supple.     Right lower leg: No edema.     Left lower leg: No edema.  Lymphadenopathy:     Head:     Right side of head: No preauricular, posterior auricular or occipital adenopathy.     Left side of head: No preauricular, posterior auricular or occipital adenopathy.     Cervical: No cervical adenopathy.     Upper Body:     Right upper body: No supraclavicular adenopathy.     Left upper body: No supraclavicular adenopathy.  Skin:    General: Skin is warm and dry.  Neurological:     Mental Status: She is  alert and oriented to person, place, and time. Mental status is at baseline.  Psychiatric:        Mood and Affect: Mood normal.   Laboratory data Infusion on 02/16/2021  Component Date Value Ref Range Status   WBC 02/16/2021 6.7  4.0 - 10.5 K/uL Final   RBC 02/16/2021 4.25  3.87 - 5.11 MIL/uL Final   Hemoglobin 02/16/2021 11.7 (A) 12.0 - 15.0 g/dL Final   HCT 02/16/2021 36.0  36.0 - 46.0 % Final   MCV 02/16/2021 84.7  80.0 - 100.0 fL Final   MCH 02/16/2021 27.5  26.0 - 34.0 pg Final   MCHC 02/16/2021 32.5  30.0 - 36.0 g/dL Final   RDW 02/16/2021 13.6  11.5 - 15.5 % Final   Platelets 02/16/2021 119 (A) 150 - 400 K/uL Final   nRBC 02/16/2021 0.0  0.0 - 0.2 % Final   Neutrophils Relative % 02/16/2021 69  % Final   Neutro Abs 02/16/2021 4.6  1.7 - 7.7 K/uL Final   Lymphocytes Relative 02/16/2021 16  % Final   Lymphs Abs 02/16/2021 1.1  0.7 - 4.0 K/uL Final   Monocytes Relative 02/16/2021 8  % Final   Monocytes Absolute 02/16/2021 0.5  0.1 - 1.0 K/uL Final   Eosinophils Relative 02/16/2021 7  % Final   Eosinophils Absolute 02/16/2021 0.4  0.0 - 0.5 K/uL Final   Basophils Relative 02/16/2021 0  % Final   Basophils Absolute 02/16/2021 0.0  0.0 - 0.1 K/uL Final   Immature Granulocytes 02/16/2021 0  % Final   Abs Immature Granulocytes 02/16/2021 0.03  0.00 - 0.07 K/uL Final   Performed at [facility], [address], [place], [place] [address]    Assessment/Plan 1. Encounter for antineoplastic chemotherapy   2. Multiple myeloma in remission ([place])   3. Hypomagnesemia   4. Chronic nausea   5. 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 Normocytic anemia   8. 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[place])     # Recurrent lambda light chain multiple myeloma s/p second autologous bone marrow transplant [05/10/2020] Clinically patient is doing well.  Labs are reviewed and discussed with patient. Proceed with Velcade 89m SubQ Check multiple myeloma panel,  light chain ratio monthly  Continue prophylaxis with Valtrex up to 1 year after transplant-November 2022 [facility] ([phone].  #Hyperglobulinemia post stem cell transplantation.  Currently she has no frequent infection, hold off IVIG replacement therapy.  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   She agrees with trying gabapentin. Start with 3030mdaily, titrate to 30022mID if  needed.   # iron deficiency anemia, Hemoglobin is stable at 11.7,   #chronic diarrhea, Gastroenterology recommendation was reviewed.  Avoid artificial sugar.  Colonoscopy showed no microscopic colitis. Her symptom seems to have improved slightly after dietary modification. Velcade side effects may also contribute to diarrhea  #Chronic hypomagnesemia Magnesium is 1.6 today, proceed with  IV magnesium today. Off Slow-Mag 64 mg twice daily.  Per GI recommendation as this potentially precipitate her diarrhea. continue magnesium checked twice a week +/- IV magnesium  #Dysphagia, chronic nausea, patient request to have IV Phenergan in the clinic with treatment.    # B12 deficiency, patient has been on vitamin B12 monthly. Last dose 11/09/2020. Vitamin B12 is normal at 431    RTC: 1 week, lab magnesium/IV magnesium twice per week,  RTC in 2 weeks for labs (CBC with diff, CMP, Mg ), md  for  Velcade and IV Mg + B12 injection.   I discussed the assessment and treatment plan with the patient.  The patient was provided an opportunity to ask questions and all were answered.  The patient agreed with the plan and demonstrated an understanding of the instructions.  The patient was advised to call back if the symptoms worsen or if the condition fails to improve as anticipated.   Earlie Server, MD, PhD Hematology Oncology [place] at [facility] 02/16/2021

## 2021-02-16 NOTE — Progress Notes (Signed)
Patient tolerated Mgnesium & Venofer infusion well today, Velcade SQ also given. No concerns voiced. Patient discharged. Stable.

## 2021-02-16 NOTE — Telephone Encounter (Signed)
Error

## 2021-02-16 NOTE — Progress Notes (Signed)
Patient here for follow. Pt reports increased leg cramps, and numbling & tingling to hands and feet. She would like to discuss about starting gabapentin.

## 2021-02-17 ENCOUNTER — Encounter: Payer: Self-pay | Admitting: Hematology and Oncology

## 2021-02-17 ENCOUNTER — Encounter: Payer: Self-pay | Admitting: Internal Medicine

## 2021-02-20 ENCOUNTER — Inpatient Hospital Stay: Payer: Medicare HMO

## 2021-02-20 DIAGNOSIS — Z5112 Encounter for antineoplastic immunotherapy: Secondary | ICD-10-CM | POA: Diagnosis not present

## 2021-02-20 DIAGNOSIS — R1310 Dysphagia, unspecified: Secondary | ICD-10-CM | POA: Diagnosis not present

## 2021-02-20 DIAGNOSIS — C9000 Multiple myeloma not having achieved remission: Secondary | ICD-10-CM

## 2021-02-20 DIAGNOSIS — E538 Deficiency of other specified B group vitamins: Secondary | ICD-10-CM | POA: Diagnosis not present

## 2021-02-20 DIAGNOSIS — D509 Iron deficiency anemia, unspecified: Secondary | ICD-10-CM | POA: Diagnosis not present

## 2021-02-20 DIAGNOSIS — K521 Toxic gastroenteritis and colitis: Secondary | ICD-10-CM | POA: Diagnosis not present

## 2021-02-20 DIAGNOSIS — T451X5A Adverse effect of antineoplastic and immunosuppressive drugs, initial encounter: Secondary | ICD-10-CM | POA: Diagnosis not present

## 2021-02-20 DIAGNOSIS — C9001 Multiple myeloma in remission: Secondary | ICD-10-CM | POA: Diagnosis not present

## 2021-02-20 DIAGNOSIS — G620 Drug-induced polyneuropathy: Secondary | ICD-10-CM | POA: Diagnosis not present

## 2021-02-20 DIAGNOSIS — R339 Retention of urine, unspecified: Secondary | ICD-10-CM | POA: Diagnosis not present

## 2021-02-20 LAB — COMPREHENSIVE METABOLIC PANEL
ALT: 33 U/L (ref 0–44)
AST: 29 U/L (ref 15–41)
Albumin: 4.3 g/dL (ref 3.5–5.0)
Alkaline Phosphatase: 104 U/L (ref 38–126)
Anion gap: 8 (ref 5–15)
BUN: 29 mg/dL — ABNORMAL HIGH (ref 8–23)
CO2: 22 mmol/L (ref 22–32)
Calcium: 9 mg/dL (ref 8.9–10.3)
Chloride: 105 mmol/L (ref 98–111)
Creatinine, Ser: 1.09 mg/dL — ABNORMAL HIGH (ref 0.44–1.00)
GFR, Estimated: 57 mL/min — ABNORMAL LOW (ref >60)
Glucose, Bld: 168 mg/dL — ABNORMAL HIGH (ref 70–99)
Potassium: 4.3 mmol/L (ref 3.5–5.1)
Sodium: 135 mmol/L (ref 135–145)
Total Bilirubin: 0.5 mg/dL (ref 0.3–1.2)
Total Protein: 7.3 g/dL (ref 6.5–8.1)

## 2021-02-20 LAB — MAGNESIUM: Magnesium: 1.3 mg/dL — ABNORMAL LOW (ref 1.7–2.4)

## 2021-02-20 LAB — CBC WITH DIFFERENTIAL/PLATELET
Abs Immature Granulocytes: 0.02 10*3/uL (ref 0.00–0.07)
Basophils Absolute: 0 10*3/uL (ref 0.0–0.1)
Basophils Relative: 1 %
Eosinophils Absolute: 0.3 10*3/uL (ref 0.0–0.5)
Eosinophils Relative: 3 %
HCT: 33.5 % — ABNORMAL LOW (ref 36.0–46.0)
Hemoglobin: 11.2 g/dL — ABNORMAL LOW (ref 12.0–15.0)
Immature Granulocytes: 0 %
Lymphocytes Relative: 13 %
Lymphs Abs: 1 10*3/uL (ref 0.7–4.0)
MCH: 28 pg (ref 26.0–34.0)
MCHC: 33.4 g/dL (ref 30.0–36.0)
MCV: 83.8 fL (ref 80.0–100.0)
Monocytes Absolute: 0.7 10*3/uL (ref 0.1–1.0)
Monocytes Relative: 9 %
Neutro Abs: 5.6 10*3/uL (ref 1.7–7.7)
Neutrophils Relative %: 74 %
Platelets: 110 10*3/uL — ABNORMAL LOW (ref 150–400)
RBC: 4 MIL/uL (ref 3.87–5.11)
RDW: 13.7 % (ref 11.5–15.5)
WBC: 7.6 10*3/uL (ref 4.0–10.5)
nRBC: 0 % (ref 0.0–0.2)

## 2021-02-20 MED ORDER — MAGNESIUM SULFATE 2 GM/50ML IV SOLN
2.0000 g | Freq: Once | INTRAVENOUS | Status: AC
  Administered 2021-02-20: 2 g via INTRAVENOUS
  Filled 2021-02-20: qty 50

## 2021-02-20 MED ORDER — SODIUM CHLORIDE 0.9 % IV SOLN
Freq: Once | INTRAVENOUS | Status: AC
  Filled 2021-02-20: qty 250

## 2021-02-20 MED ORDER — HEPARIN SOD (PORK) LOCK FLUSH 100 UNIT/ML IV SOLN
INTRAVENOUS | Status: AC
  Administered 2021-02-20: 500 [IU] via INTRAVENOUS
  Filled 2021-02-20: qty 5

## 2021-02-20 MED ORDER — SODIUM CHLORIDE 0.9 % IV SOLN: 6.0000 g | Freq: Once | INTRAVENOUS | Status: DC

## 2021-02-20 MED ORDER — HEPARIN SOD (PORK) LOCK FLUSH 100 UNIT/ML IV SOLN
500.0000 [IU] | Freq: Once | INTRAVENOUS | Status: AC
  Filled 2021-02-20: qty 5

## 2021-02-20 MED ORDER — MAGNESIUM SULFATE 4 GM/100ML IV SOLN
4.0000 g | Freq: Once | INTRAVENOUS | Status: AC
  Administered 2021-02-20: 4 g via INTRAVENOUS
  Filled 2021-02-20: qty 100

## 2021-02-20 MED ORDER — SODIUM CHLORIDE 0.9 % IV SOLN
25.0000 mg | Freq: Once | INTRAVENOUS | Status: AC
  Administered 2021-02-20: 25 mg via INTRAVENOUS
  Filled 2021-02-20: qty 1

## 2021-02-20 MED ORDER — SODIUM CHLORIDE 0.9% FLUSH
10.0000 mL | INTRAVENOUS | Status: DC | PRN
  Administered 2021-02-20: 10 mL via INTRAVENOUS
  Filled 2021-02-20: qty 10

## 2021-02-20 NOTE — Patient Instructions (Addendum)
[facility]   Discharge Instructions: Thank you for choosing [facility] to provide your oncology and hematology care.  If you have a lab appointment with the [facility], please go directly to the [facility] [place] [facility] and check in at the registration area.  Wear comfortable clothing and clothing appropriate for easy access to any Portacath or PICC line.   We strive to give you quality time with your provider. You may need to reschedule your appointment if you arrive late (15 or more minutes).  Arriving late affects you and other patients whose appointments are after yours.  Also, if you miss three or more appointments without notifying the office, you may be dismissed from the clinic at the provider's discretion.      For prescription refill requests, have your pharmacy contact our office and allow 72 hours for refills to be completed.    Today you received the following: Magnesium Sulfate and Phenergan.      To help prevent nausea and vomiting after your treatment, we encourage you to take your nausea medication as directed.  BELOW ARE SYMPTOMS THAT SHOULD BE REPORTED IMMEDIATELY: *FEVER GREATER THAN 100.4 F (38 C) OR HIGHER *CHILLS OR SWEATING *NAUSEA AND VOMITING THAT IS NOT CONTROLLED WITH YOUR NAUSEA MEDICATION *UNUSUAL SHORTNESS OF BREATH *UNUSUAL BRUISING OR BLEEDING *URINARY PROBLEMS (pain or burning when urinating, or frequent urination) *BOWEL PROBLEMS (unusual diarrhea, constipation, pain near the anus) TENDERNESS IN MOUTH AND THROAT WITH OR WITHOUT PRESENCE OF ULCERS (sore throat, sores in mouth, or a toothache) UNUSUAL RASH, SWELLING OR PAIN  UNUSUAL VAGINAL DISCHARGE OR ITCHING   Items with * indicate a potential emergency and should be followed up as soon as possible or go to the Emergency Department if any problems should occur.  Please show the CHEMOTHERAPY ALERT CARD or IMMUNOTHERAPY ALERT CARD at check-in to the Emergency  Department and triage nurse.  Should you have questions after your visit or need to cancel or reschedule your appointment, please contact [facility]  [phone] and follow the prompts.  Office hours are 8:00 a.m. to 4:30 p.m. Monday - Friday. Please note that voicemails left after 4:00 p.m. may not be returned until the following business day.  We are closed weekends and major holidays. You have access to a nurse at all times for urgent questions. Please call the main number to the clinic [phone] and follow the prompts.  For any non-urgent questions, you may also contact your provider using MyChart. We now offer e-Visits for anyone 63 and older to request care online for non-urgent symptoms. For details visit [email or web address].   Also download the MyChart app! Go to the app store, search "MyChart", open the app, select [facility], and log in with your MyChart username and password.  Due to Covid, a mask is required upon entering the hospital/clinic. If you do not have a mask, one will be given to you upon arrival. For doctor visits, patients may have 1 support person aged 14 or older with them. For treatment visits, patients cannot have anyone with them due to current Covid guidelines and our immunocompromised population.

## 2021-02-23 ENCOUNTER — Other Ambulatory Visit: Payer: Self-pay

## 2021-02-23 ENCOUNTER — Encounter: Payer: Self-pay | Admitting: Oncology

## 2021-02-23 ENCOUNTER — Inpatient Hospital Stay: Payer: Medicare HMO

## 2021-02-23 ENCOUNTER — Inpatient Hospital Stay (HOSPITAL_BASED_OUTPATIENT_CLINIC_OR_DEPARTMENT_OTHER): Payer: Medicare HMO | Admitting: Hospice and Palliative Medicine

## 2021-02-23 DIAGNOSIS — Z03818 Encounter for observation for suspected exposure to other biological agents ruled out: Secondary | ICD-10-CM | POA: Diagnosis not present

## 2021-02-23 DIAGNOSIS — G620 Drug-induced polyneuropathy: Secondary | ICD-10-CM | POA: Diagnosis not present

## 2021-02-23 DIAGNOSIS — J028 Acute pharyngitis due to other specified organisms: Secondary | ICD-10-CM | POA: Diagnosis not present

## 2021-02-23 DIAGNOSIS — T451X5A Adverse effect of antineoplastic and immunosuppressive drugs, initial encounter: Secondary | ICD-10-CM | POA: Diagnosis not present

## 2021-02-23 DIAGNOSIS — E538 Deficiency of other specified B group vitamins: Secondary | ICD-10-CM | POA: Diagnosis not present

## 2021-02-23 DIAGNOSIS — Z20822 Contact with and (suspected) exposure to covid-19: Secondary | ICD-10-CM

## 2021-02-23 DIAGNOSIS — R1310 Dysphagia, unspecified: Secondary | ICD-10-CM | POA: Diagnosis not present

## 2021-02-23 DIAGNOSIS — R059 Cough, unspecified: Secondary | ICD-10-CM | POA: Diagnosis not present

## 2021-02-23 DIAGNOSIS — R339 Retention of urine, unspecified: Secondary | ICD-10-CM | POA: Diagnosis not present

## 2021-02-23 DIAGNOSIS — K521 Toxic gastroenteritis and colitis: Secondary | ICD-10-CM | POA: Diagnosis not present

## 2021-02-23 DIAGNOSIS — C9000 Multiple myeloma not having achieved remission: Secondary | ICD-10-CM

## 2021-02-23 DIAGNOSIS — Z5112 Encounter for antineoplastic immunotherapy: Secondary | ICD-10-CM | POA: Diagnosis not present

## 2021-02-23 DIAGNOSIS — D509 Iron deficiency anemia, unspecified: Secondary | ICD-10-CM | POA: Diagnosis not present

## 2021-02-23 DIAGNOSIS — B9789 Other viral agents as the cause of diseases classified elsewhere: Secondary | ICD-10-CM | POA: Diagnosis not present

## 2021-02-23 DIAGNOSIS — C9001 Multiple myeloma in remission: Secondary | ICD-10-CM | POA: Diagnosis not present

## 2021-02-23 LAB — MAGNESIUM: Magnesium: 1.4 mg/dL — ABNORMAL LOW (ref 1.7–2.4)

## 2021-02-23 MED ORDER — MAGNESIUM SULFATE 4 GM/100ML IV SOLN
4.0000 g | Freq: Once | INTRAVENOUS | Status: AC
  Administered 2021-02-23: 4 g via INTRAVENOUS
  Filled 2021-02-23: qty 100

## 2021-02-23 MED ORDER — HEPARIN SOD (PORK) LOCK FLUSH 100 UNIT/ML IV SOLN
INTRAVENOUS | Status: AC
  Filled 2021-02-23: qty 5

## 2021-02-23 MED ORDER — SODIUM CHLORIDE 0.9 % IV SOLN
INTRAVENOUS | Status: DC
  Filled 2021-02-23: qty 250

## 2021-02-23 MED ORDER — SODIUM CHLORIDE 0.9 % IV SOLN: 4.0000 g | Freq: Once | INTRAVENOUS | Status: DC

## 2021-02-23 MED ORDER — SODIUM CHLORIDE 0.9 % IV SOLN
25.0000 mg | Freq: Once | INTRAVENOUS | Status: AC
  Administered 2021-02-23: 25 mg via INTRAVENOUS
  Filled 2021-02-23: qty 1

## 2021-02-23 NOTE — Progress Notes (Signed)
[facility]  [phone] [phone]  Patient Care Team: Glean Hess, MD as PCP - General (Internal Medicine) Jodell Cipro, MD as Referring Physician (Pain Medicine) Jennye Boroughs, MD as Consulting Physician (Cardiology) Earlie Server, MD as Consulting Physician (Oncology)   Name of the patient: Sarah [name]  [id number]  27-Dec-1955   Date of visit: 02/23/21  Reason for Consult:  Sarah [name] is a 65 year old woman with multiple medical problems including multiple myeloma status post bone marrow transplant on maintenance treatment with Velcade.  Patient was an acute add-on to [facility] today.  She was here for Velcade and was found to have low-grade fever of 100.0 and reports of a scratchy throat and nonproductive cough for the past 5 days.  Patient reports that her sister has had cough, congestion, and shortness of breath for several weeks but has not seen an MD or been tested for COVID.    Patient has had contact with her sick sister recently.  Patient then developed scratchy throat and cough.  She has felt chilled this morning.  She denies shortness of breath.  No rhinorrhea or postnasal drip.  No facial pain or tenderness.  She does endorse chronic nausea and diarrhea but unchanged from baseline.  Patient reports that she is fully vaccinated for COVID and has received 1 booster since she had her transplant.  Denies any neurologic complaints.  Denies any easy bleeding or bruising. Reports good appetite and denies weight loss. Denies chest pain.  Denies urinary complaints. Patient offers no further specific complaints today.  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[place])  02/25/2017   Due to Zometa   Cardiomyopathy, idiopathic ([place]) 08/21/2017   Overview:  Septal and apical hypokinesis with ef 40%   CHF (congestive heart failure) (HCC)    CKD (chronic kidney disease) stage 3, GFR 30-59 ml/min (HCC)    Depression    Diabetes mellitus (HCC)    Dizziness    Fatty liver    Frequent falls    GERD (gastroesophageal reflux disease)    Gout    Heart murmur    History of blood transfusion    History of bone marrow transplant ([place])    History of uterine fibroid    Hx of cardiac catheterization 06/05/2016   Overview:  Normal coronaries 2017   Hypertension    Hypomagnesemia    IDA (iron deficiency anemia)    Multiple myeloma ([place])    Personal history of chemotherapy    Renal cyst    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[place]) 06/05/2016   Overview:  Sp ablation 1999    PAST SURGICAL HISTORY:  Past Surgical History:  Procedure Laterality Date   ABDOMINAL HYSTERECTOMY     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   CARDIAC ELECTROPHYSIOLOGY MAPPING AND ABLATION     CARPAL TUNNEL RELEASE Bilateral    CHOLECYSTECTOMY  2008   COLONOSCOPY WITH PROPOFOL N/A 05/07/2017   Procedure: COLONOSCOPY WITH PROPOFOL;  Surgeon: [name], MD;  Location[facility] ENDOSCOPY;  Service: Gastroenterology;  Laterality: N/A;   COLONOSCOPY WITH PROPOFOL N/A 12/19/2020   Procedure: COLONOSCOPY WITH PROPOFOL;  Surgeon: [name], MD;  Location: [facility] ENDOSCOPY;  Service: Gastroenterology;  Laterality: N/A;  ESOPHAGOGASTRODUODENOSCOPY (EGD) WITH PROPOFOL N/A 05/07/2017   Procedure: ESOPHAGOGASTRODUODENOSCOPY (EGD) WITH PROPOFOL;  Surgeon: [name], MD;  Location: [facility] ENDOSCOPY;  Service: Gastroenterology;  Laterality: N/A;   FOOT SURGERY Bilateral    INCONTINENCE SURGERY  2009   INTERSTIM IMPLANT PLACEMENT     other     over active bladder   OTHER SURGICAL HISTORY     bladder stimulator    PARTIAL HYSTERECTOMY  03/1996   fibroids   PORTA CATH INSERTION N/A 03/10/2019   Procedure: PORTA CATH  INSERTION;  Surgeon: Algernon Huxley, MD;  Location: [facility] LAB;  Service: Cardiovascular;  Laterality: N/A;   TONSILLECTOMY  2007    HEMATOLOGY/ONCOLOGY HISTORY:  Oncology History Overview Note  # June 2017- IgALamda MULTIPLE MYELOMA [BMBx- 80% plasma cells; [facility]]; Lamda light chain 1340; s/p RVD   # 14th DEC 2016- Auto-Stem cell transplant [[facility];Dr.Hertzig]; rev [dose reduced sec to [place]  # Maintenance Revlimid- 35m 3 w-ON & 1 week OFF; HELD July 2018- sec to ONJ  # Bone lesions-numerous ~571mlytic lesions in Thoracic spine- on Zometa  # 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  # "Colitis" s/p multiple EGD/Colonoscopies; EGD- patchy inflammation/colo- Nov 2018 [Dr.Anna]  DIAGNOSIS: Multiple myeloma  GOALS: Control/palliative  CURRENT/MOST RECENT THERAPY -surveillance    Multiple myeloma not having achieved remission ([facility] 09/27/2019 - 04/10/2020 Chemotherapy          08/31/2020 -  Chemotherapy    Patient is on Treatment Plan: MYELOMA MAINTENANCE BORTEZOMIB SQ Q14D         ALLERGIES:  is allergic to oxycodone-acetaminophen, celebrex [celecoxib], codeine, plerixafor, benadryl [diphenhydramine], morphine, ondansetron, and tylenol [acetaminophen].  MEDICATIONS:  Current Outpatient Medications  Medication Sig Dispense Refill   acetaminophen (TYLENOL) 500 MG tablet Take 500 mg by mouth every 6 (six) hours as needed.     acyclovir (ZOVIRAX) 400 MG tablet Take 400 mg by mouth 2 (two) times daily.     allopurinol (ZYLOPRIM) 100 MG tablet TAKE 1 TABLET(100 MG) BY MOUTH DAILY as needed 90 tablet 1   bisoprolol (ZEBETA) 5 MG tablet Take 5 mg by mouth daily.     diclofenac sodium (VOLTAREN) 1 % GEL Apply 2 g topically 4 (four) times daily. (Patient not taking: Reported on  02/16/2021) 100 g 1   diphenhydrAMINE (BENADRYL) 50 MG capsule Take 50 mg by mouth as needed.     diphenoxylate-atropine (LOMOTIL) 2.5-0.025 MG tablet Take 2 tablets by mouth 4 (four) times daily as needed for diarrhea or loose stools. 64 tablet 3   FLUoxetine (PROZAC) 40 MG capsule TAKE 1 CAPSULE EVERY DAY 90 capsule 1   fluticasone (FLONASE) 50 MCG/ACT nasal spray Place 2 sprays into both nostrils daily. 16 g 2   gabapentin (NEURONTIN) 300 MG capsule Take 1 capsule (300 mg total) by mouth 2 (two) times daily. 60 capsule 0   glucose blood test strip      lactulose (CHRONULAC) 10 GM/15ML solution Take 20 g by mouth daily as needed. (Patient not taking: No sig reported)     lidocaine (LIDODERM) 5 % 1 patch daily. (Patient not taking: No sig reported)     loperamide (IMODIUM) 2 MG capsule Take 1 capsule (2 mg total) by mouth 4 (four) times daily as needed for diarrhea or loose  stools. (Patient not taking: Reported on 02/16/2021) 120 capsule 3   methocarbamol (ROBAXIN) 500 MG tablet TAKE 1 TABLET FOUR TIMES DAILY. 120 tablet 0   Multiple Vitamin (MULTIVITAMIN) tablet Take 1 tablet by mouth daily.     Multiple Vitamins-Minerals (HAIR/SKIN/NAILS) TABS Take 1 tablet by mouth daily.     omeprazole (PRILOSEC) 40 MG capsule Take 1 capsule (40 mg total) by mouth in the morning and at bedtime. 180 capsule 1   promethazine (PHENERGAN) 25 MG tablet Take 1 tablet (25 mg total) by mouth every 6 (six) hours as needed for nausea or vomiting. 90 tablet 1   promethazine-dextromethorphan (PROMETHAZINE-DM) 6.25-15 MG/5ML syrup Take 5 mLs by mouth 4 (four) times daily as needed for cough. (Patient not taking: Reported on 02/16/2021) 118 mL 0   traZODone (DESYREL) 100 MG tablet Take 1 tablet (100 mg total) by mouth at bedtime. 90 tablet 1   No current facility-administered medications for this visit.   Facility-Administered Medications Ordered in Other Visits  Medication Dose Route Frequency Provider Last Rate Last Admin    0.9 %  sodium chloride infusion   Intravenous Continuous Lequita Asal, MD   Stopped at 01/20/20 1232   0.9 %  sodium chloride infusion   Intravenous Continuous Lequita Asal, MD   Stopped at 12/19/20 0856   0.9 %  sodium chloride infusion   Intravenous Continuous Earlie Server, MD 10 mL/hr at 11/30/20 0957 New Bag at 11/30/20 0957   0.9 %  sodium chloride infusion   Intravenous Continuous Earlie Server, MD 20 mL/hr at 02/23/21 0857 New Bag at 02/23/21 0857   heparin lock flush 100 UNIT/ML injection            heparin lock flush 100 unit/mL  500 Units Intravenous Once Faythe Casa E, NP       heparin lock flush 100 unit/mL  500 Units Intravenous Once Nolon Stalls C, MD       heparin lock flush 100 unit/mL  500 Units Intravenous Once Earlie Server, MD       magnesium sulfate IVPB 4 g 100 mL  4 g Intravenous Once Earlie Server, MD 50 mL/hr at 02/23/21 0938 4 g at 02/23/21 0938   sodium chloride flush (NS) 0.9 % injection 10 mL  10 mL Intravenous PRN Nolon Stalls C, MD   10 mL at 02/07/20 0815   sodium chloride flush (NS) 0.9 % injection 10 mL  10 mL Intracatheter PRN Cammie Sickle, MD   10 mL at 02/10/20 0815   sodium chloride flush (NS) 0.9 % injection 10 mL  10 mL Intravenous PRN Jacquelin Hawking, NP   10 mL at 03/01/20 1143   sodium chloride flush (NS) 0.9 % injection 10 mL  10 mL Intravenous PRN Earlie Server, MD   10 mL at 01/19/21 1044    VITAL SIGNS: There were no vitals taken for this visit. There were no vitals filed for this visit.  Estimated body mass index is 28.06 kg/m as calculated from the following:   Height as of 12/19/20: 5' 2"  (1.575 m).   Weight as of 02/16/21: 153 lb 6.4 oz (69.6 kg).  LABS: CBC:    Component Value Date/Time   WBC 7.6 02/20/2021 1101   HGB 11.2 (L) 02/20/2021 1101   HCT 33.5 (L) 02/20/2021 1101   PLT 110 (L) 02/20/2021 1101   MCV 83.8 02/20/2021 1101   NEUTROABS 5.6 02/20/2021 1101   LYMPHSABS 1.0 02/20/2021 1101   MONOABS  0.7 02/20/2021  1101   EOSABS 0.3 02/20/2021 1101   BASOSABS 0.0 02/20/2021 1101   Comprehensive Metabolic Panel:    Component Value Date/Time   NA 135 02/20/2021 1101   NA 141 04/19/2016 1604   K 4.3 02/20/2021 1101   CL 105 02/20/2021 1101   CO2 22 02/20/2021 1101   BUN 29 (H) 02/20/2021 1101   BUN 22 04/19/2016 1604   CREATININE 1.09 (H) 02/20/2021 1101   GLUCOSE 168 (H) 02/20/2021 1101   CALCIUM 9.0 02/20/2021 1101   CALCIUM 8.8 03/23/2020 1144   AST 29 02/20/2021 1101   ALT 33 02/20/2021 1101   ALKPHOS 104 02/20/2021 1101   BILITOT 0.5 02/20/2021 1101   PROT 7.3 02/20/2021 1101   ALBUMIN 4.3 02/20/2021 1101    RADIOGRAPHIC STUDIES: No results found.  PERFORMANCE STATUS (ECOG) : 1 - Symptomatic but completely ambulatory  Review of Systems Unless otherwise noted, a complete review of systems is negative.  Physical Exam General: NAD Cardiovascular: regular rate and rhythm Pulmonary: clear ant fields Abdomen: soft, nontender, + bowel sounds GU: no suprapubic tenderness Extremities: no edema, no joint deformities Skin: no rashes Neurological: Nonfocal  Assessment and Plan- Patient is a 65 y.o. female multiple myeloma status post bone marrow transplant on maintenance Velcade who was an add-on to [facility] today for evaluation of fever, cough, and scratchy throat   Symptoms clinically suspicious for COVID.  Unfortunately, symptoms started 5 days ago and patient is likely outside the treatment window.  I have requested that patient obtain COVID testing today and notify us of the results.  We discussed symptomatic care.  I recommended increased fluids and rest.  I recommended monitoring SaO2 with home pulse ox.  We discussed triggers for ER or urgent care utilization.   Patient expressed understanding and was in agreement with this plan. She also understands that She can call clinic at any time with any questions, concerns, or complaints.   Thank you for allowing me to participate in the  care of this very pleasant patient.   Time Total: 20 minutes  Visit consisted of counseling and education dealing with the complex and emotionally intense issues of symptom management and palliative care in the setting of serious and potentially life-threatening illness.Greater than 50%  of this time was spent counseling and coordinating care related to the above assessment and plan.  Signed by: Altha Harm, PhD, NP-C

## 2021-02-23 NOTE — Progress Notes (Signed)
Pt here for labs/ possible mag.  Pt states that she has been feeling " under the weather"  states her sister has been sick for a month.  Pt states she had 2 days of diarrhea this week, scratchy throat and fatigue. Temp today 100.8 .  MD aware. Advised for pt to be covid tested today. Pt received IV mag in private room in infusion .  Josh Borders NP in to evaluate pt.  Pt verbalizes she will go get covid tested today.

## 2021-02-23 NOTE — Patient Instructions (Signed)
[facility]  Discharge Instructions: Thank you for choosing [facility] to provide your oncology and hematology care.  If you have a lab appointment with the [facility], please go directly to the [facility] and check in at the registration area.  Wear comfortable clothing and clothing appropriate for easy access to any Portacath or PICC line.   We strive to give you quality time with your provider. You may need to reschedule your appointment if you arrive late (15 or more minutes).  Arriving late affects you and other patients whose appointments are after yours.  Also, if you miss three or more appointments without notifying the office, you may be dismissed from the clinic at the provider's discretion.      For prescription refill requests, have your pharmacy contact our office and allow 72 hours for refills to be completed.    Today you received the following : magnesium   To help prevent nausea and vomiting after your treatment, we encourage you to take your nausea medication as directed.  BELOW ARE SYMPTOMS THAT SHOULD BE REPORTED IMMEDIATELY: *FEVER GREATER THAN 100.4 F (38 C) OR HIGHER *CHILLS OR SWEATING *NAUSEA AND VOMITING THAT IS NOT CONTROLLED WITH YOUR NAUSEA MEDICATION *UNUSUAL SHORTNESS OF BREATH *UNUSUAL BRUISING OR BLEEDING *URINARY PROBLEMS (pain or burning when urinating, or frequent urination) *BOWEL PROBLEMS (unusual diarrhea, constipation, pain near the anus) TENDERNESS IN MOUTH AND THROAT WITH OR WITHOUT PRESENCE OF ULCERS (sore throat, sores in mouth, or a toothache) UNUSUAL RASH, SWELLING OR PAIN  UNUSUAL VAGINAL DISCHARGE OR ITCHING   Items with * indicate a potential emergency and should be followed up as soon as possible or go to the Emergency Department if any problems should occur.  Please show the CHEMOTHERAPY ALERT CARD or IMMUNOTHERAPY ALERT CARD at check-in to the Emergency Department and triage  nurse.  Should you have questions after your visit or need to cancel or reschedule your appointment, please contact [facility]  [phone] and follow the prompts.  Office hours are 8:00 a.m. to 4:30 p.m. Monday - Friday. Please note that voicemails left after 4:00 p.m. may not be returned until the following business day.  We are closed weekends and major holidays. You have access to a nurse at all times for urgent questions. Please call the main number to the clinic [phone] and follow the prompts.  For any non-urgent questions, you may also contact your provider using MyChart. We now offer e-Visits for anyone 51 and older to request care online for non-urgent symptoms. For details visit [email or web address].   Also download the MyChart app! Go to the app store, search "MyChart", open the app, select [facility], and log in with your MyChart username and password.  Due to Covid, a mask is required upon entering the hospital/clinic. If you do not have a mask, one will be given to you upon arrival. For doctor visits, patients may have 1 support person aged 61 or older with them. For treatment visits, patients cannot have anyone with them due to current Covid guidelines and our immunocompromised population.

## 2021-02-27 ENCOUNTER — Other Ambulatory Visit: Payer: Self-pay

## 2021-02-27 ENCOUNTER — Inpatient Hospital Stay: Payer: Medicare HMO

## 2021-02-27 VITALS — BP 124/83 | HR 65 | Temp 98.7°F | Resp 18

## 2021-02-27 DIAGNOSIS — C9000 Multiple myeloma not having achieved remission: Secondary | ICD-10-CM

## 2021-02-27 DIAGNOSIS — G620 Drug-induced polyneuropathy: Secondary | ICD-10-CM | POA: Diagnosis not present

## 2021-02-27 DIAGNOSIS — D509 Iron deficiency anemia, unspecified: Secondary | ICD-10-CM | POA: Diagnosis not present

## 2021-02-27 DIAGNOSIS — K521 Toxic gastroenteritis and colitis: Secondary | ICD-10-CM | POA: Diagnosis not present

## 2021-02-27 DIAGNOSIS — E538 Deficiency of other specified B group vitamins: Secondary | ICD-10-CM | POA: Diagnosis not present

## 2021-02-27 DIAGNOSIS — R1310 Dysphagia, unspecified: Secondary | ICD-10-CM | POA: Diagnosis not present

## 2021-02-27 DIAGNOSIS — Z5112 Encounter for antineoplastic immunotherapy: Secondary | ICD-10-CM | POA: Diagnosis not present

## 2021-02-27 DIAGNOSIS — C9001 Multiple myeloma in remission: Secondary | ICD-10-CM | POA: Diagnosis not present

## 2021-02-27 DIAGNOSIS — T451X5A Adverse effect of antineoplastic and immunosuppressive drugs, initial encounter: Secondary | ICD-10-CM | POA: Diagnosis not present

## 2021-02-27 DIAGNOSIS — R339 Retention of urine, unspecified: Secondary | ICD-10-CM | POA: Diagnosis not present

## 2021-02-27 LAB — MAGNESIUM: Magnesium: 1.3 mg/dL — ABNORMAL LOW (ref 1.7–2.4)

## 2021-02-27 MED ORDER — SODIUM CHLORIDE 0.9 % IV SOLN
25.0000 mg | Freq: Once | INTRAVENOUS | Status: AC
  Administered 2021-02-27: 25 mg via INTRAVENOUS
  Filled 2021-02-27: qty 1

## 2021-02-27 MED ORDER — SODIUM CHLORIDE 0.9 % IV SOLN: 6.0000 g | Freq: Once | INTRAVENOUS | Status: DC

## 2021-02-27 MED ORDER — MAGNESIUM SULFATE 2 GM/50ML IV SOLN
2.0000 g | Freq: Once | INTRAVENOUS | Status: AC
  Administered 2021-02-27: 2 g via INTRAVENOUS
  Filled 2021-02-27: qty 50

## 2021-02-27 MED ORDER — HEPARIN SOD (PORK) LOCK FLUSH 100 UNIT/ML IV SOLN
500.0000 [IU] | Freq: Once | INTRAVENOUS | Status: AC
  Administered 2021-02-27: 500 [IU] via INTRAVENOUS
  Filled 2021-02-27: qty 5

## 2021-02-27 MED ORDER — MAGNESIUM SULFATE 4 GM/100ML IV SOLN
4.0000 g | Freq: Once | INTRAVENOUS | Status: AC
  Administered 2021-02-27: 4 g via INTRAVENOUS
  Filled 2021-02-27: qty 100

## 2021-02-27 NOTE — Progress Notes (Signed)
Magnesium 1.3 today. Received 6 grams Magnesium IV via port. Discharged to home. Afebrile.VSS.

## 2021-02-28 ENCOUNTER — Other Ambulatory Visit: Payer: Self-pay

## 2021-02-28 DIAGNOSIS — R1319 Other dysphagia: Secondary | ICD-10-CM

## 2021-03-02 ENCOUNTER — Inpatient Hospital Stay: Payer: Medicare HMO

## 2021-03-02 ENCOUNTER — Inpatient Hospital Stay: Payer: Medicare HMO | Admitting: Oncology

## 2021-03-02 NOTE — Telephone Encounter (Signed)
Please advise on r/s

## 2021-03-05 ENCOUNTER — Inpatient Hospital Stay
Admission: EM | Admit: 2021-03-05 | Discharge: 2021-03-09 | DRG: 194 | Disposition: A | Discharge status: Home Health | Payer: Medicare HMO | Attending: Internal Medicine | Admitting: Internal Medicine

## 2021-03-05 ENCOUNTER — Emergency Department: Payer: Medicare HMO

## 2021-03-05 ENCOUNTER — Other Ambulatory Visit: Payer: Self-pay

## 2021-03-05 ENCOUNTER — Inpatient Hospital Stay: Payer: Medicare HMO

## 2021-03-05 DIAGNOSIS — N1831 Chronic kidney disease, stage 3a: Secondary | ICD-10-CM | POA: Diagnosis present

## 2021-03-05 DIAGNOSIS — I5032 Chronic diastolic (congestive) heart failure: Secondary | ICD-10-CM | POA: Diagnosis not present

## 2021-03-05 DIAGNOSIS — K219 Gastro-esophageal reflux disease without esophagitis: Secondary | ICD-10-CM | POA: Diagnosis present

## 2021-03-05 DIAGNOSIS — K760 Fatty (change of) liver, not elsewhere classified: Secondary | ICD-10-CM | POA: Diagnosis present

## 2021-03-05 DIAGNOSIS — D6959 Other secondary thrombocytopenia: Secondary | ICD-10-CM | POA: Diagnosis present

## 2021-03-05 DIAGNOSIS — E1122 Type 2 diabetes mellitus with diabetic chronic kidney disease: Secondary | ICD-10-CM | POA: Diagnosis present

## 2021-03-05 DIAGNOSIS — Z90711 Acquired absence of uterus with remaining cervical stump: Secondary | ICD-10-CM

## 2021-03-05 DIAGNOSIS — Z803 Family history of malignant neoplasm of breast: Secondary | ICD-10-CM | POA: Diagnosis not present

## 2021-03-05 DIAGNOSIS — Z808 Family history of malignant neoplasm of other organs or systems: Secondary | ICD-10-CM | POA: Diagnosis not present

## 2021-03-05 DIAGNOSIS — M1990 Unspecified osteoarthritis, unspecified site: Secondary | ICD-10-CM | POA: Diagnosis present

## 2021-03-05 DIAGNOSIS — Z9049 Acquired absence of other specified parts of digestive tract: Secondary | ICD-10-CM | POA: Diagnosis not present

## 2021-03-05 DIAGNOSIS — C9000 Multiple myeloma not having achieved remission: Secondary | ICD-10-CM | POA: Diagnosis not present

## 2021-03-05 DIAGNOSIS — C9001 Multiple myeloma in remission: Secondary | ICD-10-CM | POA: Diagnosis present

## 2021-03-05 DIAGNOSIS — I10 Essential (primary) hypertension: Secondary | ICD-10-CM | POA: Diagnosis not present

## 2021-03-05 DIAGNOSIS — Z833 Family history of diabetes mellitus: Secondary | ICD-10-CM

## 2021-03-05 DIAGNOSIS — R102 Pelvic and perineal pain: Secondary | ICD-10-CM | POA: Diagnosis not present

## 2021-03-05 DIAGNOSIS — I429 Cardiomyopathy, unspecified: Secondary | ICD-10-CM | POA: Diagnosis not present

## 2021-03-05 DIAGNOSIS — Z801 Family history of malignant neoplasm of trachea, bronchus and lung: Secondary | ICD-10-CM

## 2021-03-05 DIAGNOSIS — Z9481 Bone marrow transplant status: Secondary | ICD-10-CM | POA: Diagnosis not present

## 2021-03-05 DIAGNOSIS — Z87891 Personal history of nicotine dependence: Secondary | ICD-10-CM

## 2021-03-05 DIAGNOSIS — Z8249 Family history of ischemic heart disease and other diseases of the circulatory system: Secondary | ICD-10-CM | POA: Diagnosis not present

## 2021-03-05 DIAGNOSIS — J189 Pneumonia, unspecified organism: Secondary | ICD-10-CM | POA: Diagnosis not present

## 2021-03-05 DIAGNOSIS — R509 Fever, unspecified: Secondary | ICD-10-CM | POA: Diagnosis not present

## 2021-03-05 DIAGNOSIS — Z841 Family history of disorders of kidney and ureter: Secondary | ICD-10-CM

## 2021-03-05 DIAGNOSIS — M25551 Pain in right hip: Secondary | ICD-10-CM

## 2021-03-05 DIAGNOSIS — Z800 Family history of malignant neoplasm of digestive organs: Secondary | ICD-10-CM

## 2021-03-05 DIAGNOSIS — M109 Gout, unspecified: Secondary | ICD-10-CM | POA: Diagnosis present

## 2021-03-05 DIAGNOSIS — Z20822 Contact with and (suspected) exposure to covid-19: Secondary | ICD-10-CM | POA: Diagnosis not present

## 2021-03-05 DIAGNOSIS — T451X5A Adverse effect of antineoplastic and immunosuppressive drugs, initial encounter: Secondary | ICD-10-CM | POA: Diagnosis present

## 2021-03-05 DIAGNOSIS — I130 Hypertensive heart and chronic kidney disease with heart failure and stage 1 through stage 4 chronic kidney disease, or unspecified chronic kidney disease: Secondary | ICD-10-CM | POA: Diagnosis not present

## 2021-03-05 DIAGNOSIS — R109 Unspecified abdominal pain: Secondary | ICD-10-CM | POA: Diagnosis not present

## 2021-03-05 DIAGNOSIS — J9811 Atelectasis: Secondary | ICD-10-CM | POA: Diagnosis not present

## 2021-03-05 LAB — CBC WITH DIFFERENTIAL/PLATELET
Abs Immature Granulocytes: 0.05 10*3/uL (ref 0.00–0.07)
Basophils Absolute: 0 10*3/uL (ref 0.0–0.1)
Basophils Relative: 0 %
Eosinophils Absolute: 0.3 10*3/uL (ref 0.0–0.5)
Eosinophils Relative: 3 %
HCT: 30.6 % — ABNORMAL LOW (ref 36.0–46.0)
Hemoglobin: 10.3 g/dL — ABNORMAL LOW (ref 12.0–15.0)
Immature Granulocytes: 1 %
Lymphocytes Relative: 8 %
Lymphs Abs: 0.6 10*3/uL — ABNORMAL LOW (ref 0.7–4.0)
MCH: 28.2 pg (ref 26.0–34.0)
MCHC: 33.7 g/dL (ref 30.0–36.0)
MCV: 83.8 fL (ref 80.0–100.0)
Monocytes Absolute: 0.6 10*3/uL (ref 0.1–1.0)
Monocytes Relative: 8 %
Neutro Abs: 6 10*3/uL (ref 1.7–7.7)
Neutrophils Relative %: 80 %
Platelets: 130 10*3/uL — ABNORMAL LOW (ref 150–400)
RBC: 3.65 MIL/uL — ABNORMAL LOW (ref 3.87–5.11)
RDW: 13 % (ref 11.5–15.5)
WBC: 7.6 10*3/uL (ref 4.0–10.5)
nRBC: 0 % (ref 0.0–0.2)

## 2021-03-05 LAB — TROPONIN I (HIGH SENSITIVITY)
Troponin I (High Sensitivity): 4 ng/L (ref <18)
Troponin I (High Sensitivity): 5 ng/L (ref <18)

## 2021-03-05 LAB — LIPASE, BLOOD: Lipase: 28 U/L (ref 11–51)

## 2021-03-05 LAB — COMPREHENSIVE METABOLIC PANEL
ALT: 24 U/L (ref 0–44)
AST: 25 U/L (ref 15–41)
Albumin: 3.5 g/dL (ref 3.5–5.0)
Alkaline Phosphatase: 111 U/L (ref 38–126)
Anion gap: 9 (ref 5–15)
BUN: 26 mg/dL — ABNORMAL HIGH (ref 8–23)
CO2: 25 mmol/L (ref 22–32)
Calcium: 9.1 mg/dL (ref 8.9–10.3)
Chloride: 103 mmol/L (ref 98–111)
Creatinine, Ser: 1.26 mg/dL — ABNORMAL HIGH (ref 0.44–1.00)
GFR, Estimated: 48 mL/min — ABNORMAL LOW (ref >60)
Glucose, Bld: 174 mg/dL — ABNORMAL HIGH (ref 70–99)
Potassium: 4.4 mmol/L (ref 3.5–5.1)
Sodium: 137 mmol/L (ref 135–145)
Total Bilirubin: 0.5 mg/dL (ref 0.3–1.2)
Total Protein: 6.6 g/dL (ref 6.5–8.1)

## 2021-03-05 LAB — URINALYSIS, COMPLETE (UACMP) WITH MICROSCOPIC
Bilirubin Urine: NEGATIVE
Glucose, UA: NEGATIVE mg/dL
Ketones, ur: NEGATIVE mg/dL
Nitrite: NEGATIVE
Protein, ur: NEGATIVE mg/dL
Specific Gravity, Urine: 1.015 (ref 1.005–1.030)
Squamous Epithelial / HPF: NONE SEEN (ref 0–5)
pH: 5.5 (ref 5.0–8.0)

## 2021-03-05 LAB — RESP PANEL BY RT-PCR (FLU A&B, COVID) ARPGX2
Influenza A by PCR: NEGATIVE
Influenza B by PCR: NEGATIVE
SARS Coronavirus 2 by RT PCR: NEGATIVE

## 2021-03-05 LAB — PROCALCITONIN: Procalcitonin: <0.1 ng/mL

## 2021-03-05 LAB — LACTIC ACID, PLASMA: Lactic Acid, Venous: 1.5 mmol/L (ref 0.5–1.9)

## 2021-03-05 MED ORDER — FLUOXETINE HCL 20 MG PO CAPS
40.0000 mg | ORAL_CAPSULE | Freq: Every day | ORAL | Status: DC
  Administered 2021-03-06 – 2021-03-09 (×4): 40 mg via ORAL
  Filled 2021-03-05 (×4): qty 2

## 2021-03-05 MED ORDER — VANCOMYCIN HCL 1500 MG/300ML IV SOLN
1500.0000 mg | Freq: Once | INTRAVENOUS | Status: AC
  Administered 2021-03-05: 1500 mg via INTRAVENOUS
  Filled 2021-03-05: qty 300

## 2021-03-05 MED ORDER — ENOXAPARIN SODIUM 40 MG/0.4ML IJ SOSY
40.0000 mg | PREFILLED_SYRINGE | INTRAMUSCULAR | Status: DC
  Administered 2021-03-05 – 2021-03-08 (×4): 40 mg via SUBCUTANEOUS
  Filled 2021-03-05 (×4): qty 0.4

## 2021-03-05 MED ORDER — AZITHROMYCIN 500 MG PO TABS
500.0000 mg | ORAL_TABLET | Freq: Every day | ORAL | Status: DC
  Administered 2021-03-06 – 2021-03-07 (×2): 500 mg via ORAL
  Filled 2021-03-05 (×2): qty 1

## 2021-03-05 MED ORDER — ONDANSETRON HCL 4 MG/2ML IJ SOLN: 4.0000 mg | Freq: Once | INTRAMUSCULAR | Status: DC

## 2021-03-05 MED ORDER — METOCLOPRAMIDE HCL 5 MG/ML IJ SOLN
10.0000 mg | Freq: Once | INTRAMUSCULAR | Status: AC
  Administered 2021-03-05: 10 mg via INTRAVENOUS
  Filled 2021-03-05: qty 2

## 2021-03-05 MED ORDER — IBUPROFEN 400 MG PO TABS
400.0000 mg | ORAL_TABLET | Freq: Once | ORAL | Status: AC
  Administered 2021-03-05: 400 mg via ORAL
  Filled 2021-03-05: qty 1

## 2021-03-05 MED ORDER — SODIUM CHLORIDE 0.9 % IV SOLN
2.0000 g | Freq: Two times a day (BID) | INTRAVENOUS | Status: DC
  Administered 2021-03-05 – 2021-03-09 (×8): 2 g via INTRAVENOUS
  Filled 2021-03-05 (×9): qty 2

## 2021-03-05 MED ORDER — METRONIDAZOLE 500 MG/100ML IV SOLN
500.0000 mg | Freq: Two times a day (BID) | INTRAVENOUS | Status: DC
Start: 2021-03-05 — End: 2021-03-06
  Administered 2021-03-05 – 2021-03-06 (×2): 500 mg via INTRAVENOUS
  Filled 2021-03-05 (×3): qty 100

## 2021-03-05 MED ORDER — IOHEXOL 350 MG/ML SOLN
75.0000 mL | Freq: Once | INTRAVENOUS | Status: AC | PRN
  Administered 2021-03-05: 75 mL via INTRAVENOUS

## 2021-03-05 MED ORDER — SODIUM CHLORIDE 0.9 % IV SOLN: 2.0000 g | Freq: Once | INTRAVENOUS | Status: DC

## 2021-03-05 MED ORDER — TRAZODONE HCL 50 MG PO TABS
100.0000 mg | ORAL_TABLET | Freq: Every day | ORAL | Status: DC
  Administered 2021-03-05 – 2021-03-08 (×4): 100 mg via ORAL
  Filled 2021-03-05 (×4): qty 2

## 2021-03-05 MED ORDER — VANCOMYCIN HCL IN DEXTROSE 1-5 GM/200ML-% IV SOLN: 1000.0000 mg | Freq: Once | INTRAVENOUS | Status: DC

## 2021-03-05 MED ORDER — ACETAMINOPHEN 500 MG PO TABS: 1000.0000 mg | ORAL_TABLET | Freq: Once | ORAL | Status: DC

## 2021-03-05 MED ORDER — SODIUM CHLORIDE 0.9 % IV BOLUS
1000.0000 mL | Freq: Once | INTRAVENOUS | Status: AC
  Administered 2021-03-05: 1000 mL via INTRAVENOUS

## 2021-03-05 MED ORDER — VANCOMYCIN HCL 750 MG/150ML IV SOLN
750.0000 mg | INTRAVENOUS | Status: DC
  Filled 2021-03-05: qty 150

## 2021-03-05 MED ORDER — AZITHROMYCIN 500 MG PO TABS
500.0000 mg | ORAL_TABLET | Freq: Once | ORAL | Status: AC
  Administered 2021-03-05: 500 mg via ORAL
  Filled 2021-03-05: qty 1

## 2021-03-05 MED ORDER — PANTOPRAZOLE SODIUM 40 MG PO TBEC
40.0000 mg | DELAYED_RELEASE_TABLET | Freq: Every day | ORAL | Status: DC
  Administered 2021-03-05 – 2021-03-09 (×5): 40 mg via ORAL
  Filled 2021-03-05 (×5): qty 1

## 2021-03-05 MED ORDER — FLUTICASONE PROPIONATE 50 MCG/ACT NA SUSP
2.0000 | Freq: Every day | NASAL | Status: DC
  Administered 2021-03-06 – 2021-03-09 (×4): 2 via NASAL
  Filled 2021-03-05: qty 16

## 2021-03-05 MED ORDER — SODIUM CHLORIDE 0.9 % IV SOLN
1.0000 g | Freq: Once | INTRAVENOUS | Status: AC
  Administered 2021-03-05: 1 g via INTRAVENOUS
  Filled 2021-03-05: qty 10

## 2021-03-05 MED ORDER — PROMETHAZINE HCL 25 MG PO TABS
25.0000 mg | ORAL_TABLET | Freq: Four times a day (QID) | ORAL | Status: DC | PRN
  Administered 2021-03-05 – 2021-03-09 (×11): 25 mg via ORAL
  Filled 2021-03-05 (×13): qty 1

## 2021-03-05 NOTE — ED Provider Notes (Signed)
[facility]  ____________________________________________   Event Date/Time   First MD Initiated Contact with Patient 03/05/21 1306     (approximate)  I have reviewed the triage vital signs and the nursing notes.   HISTORY  Chief Complaint Fever    HPI Sarah [name] is a 65 y.o. female past medical history of multiple myeloma and chemotherapy and with port, diabetes, GERD who presents with a fever.  3 days ago on Friday patient had a fever of 101.  Her oncologist recommended she have a COVID test which she did which was negative.  She had low-grade temps over the weekend and then today temp was 101 again.  She endorses feeling nauseous and very fatigued.  She does have some dyspnea on exertion denies cough or congestion.  Denies chest pain.  Patient does have upper abdominal pain that she has had for a while but she is scheduled to have an EGD to evaluate.  Notes that it has been somewhat worse over the past several days.  She has had decreased p.o. intake.  Denies dysuria diarrhea or constipation.         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   Due to Zom[place]   Cardiomyopathy, idiopathic ([place]) 08/21/2017   Overview:  Septal and apical hypokinesis with ef 40%   CHF (congestive heart failure) (HCC)    CKD (chronic kidney disease) stage 3, GFR 30-59 ml/min (HCC)    Depression    Diabetes mellitus (HCC)    Dizziness    Fatty liver    Frequent falls    GERD (gastroesophageal reflux disease)    Gout    Heart murmur    History of blood transfusion    History of bone marrow transplant ([place])    History of uterine fibroid    Hx of cardiac catheterization 06/05/2016   Overview:  Normal coronaries 2017   Hypertension     Hypomagnesemia    IDA (iron deficiency anemia)    Multiple myeloma ([place])    Personal history of chemotherapy    Renal cyst    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[place]) 06/05/2016   Overview:  Sp ablation 1999    Patient Active Problem List   Diagnosis Date Noted   PVC (premature ventricular contraction) 12/04/2020   Diarrhea 10/25/2020   Encounter for antineoplastic chemotherapy 08/31/2020   Coronary artery disease involving native coronary artery of native heart 04/26/2020   Yeast vaginitis 03/18/2020   Hypokalemia 03/11/2020   Dyspnea 03/02/2020   Physical deconditioning 02/07/2020   Impaired nutrition 02/07/2020   Encounter for antineoplastic immunotherapy 10/03/2019   Hypocalcemia 08/28/2019   Stage 3 chronic kidney disease ([place]) 05/12/2019   Chronic nausea 12/17/2018   Chemotherapy-induced neuropathy ([place]) 12/17/2018   Hyperlipidemia associated with type 2 diabetes mellitus ([place]) 11/25/2018   Anemia 10/21/2018   Hypomagnesemia 08/20/2018   Goals of care, counseling/discussion 08/20/2018   B12 deficiency 08/20/2018   Thrombocytopenia ([place]) 02/09/2018   Benign essential HTN 08/21/2017   Carotid artery stenosis, asymptomatic, bilateral 08/06/2017   Thyroid nodule 08/06/2017   Iron deficiency anemia due to chronic blood loss 05/13/2017   Spinal stenosis of lumbar region with neurogenic claudication 04/09/2017   Long term prescription opiate use 09/07/2016  Chronic pain syndrome 07/08/2016   Pain medication agreement signed 07/08/2016   Spondylosis of lumbar region without myelopathy or radiculopathy 07/08/2016   Severe aortic stenosis 06/05/2016   Environmental and seasonal allergies 04/19/2016   Insomnia 04/19/2016   Asthma 04/19/2016   Aortic regurgitation 04/19/2016   Type II diabetes mellitus with complication ([place]) 53/61/4431   Depression, major, single episode, in partial remission ([place]) 04/12/2016   Irritable bowel syndrome (IBS) 04/12/2016   Hepatic steatosis 04/12/2016    Multiple myeloma not having achieved remission ([place]) 04/02/2016   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[place]) 07/06/2015   Stem cell transplant candidate 05/16/2015    Past Surgical History:  Procedure Laterality Date   ABDOMINAL HYSTERECTOMY     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   CARDIAC ELECTROPHYSIOLOGY MAPPING AND ABLATION     CARPAL TUNNEL RELEASE Bilateral    CHOLECYSTECTOMY  2008   COLONOSCOPY WITH PROPOFOL N/A 05/07/2017   Procedure: COLONOSCOPY WITH PROPOFOL;  Surgeon: [name], MD;  Location: [facility] ENDOSCOPY;  Service: Gastroenterology;  Laterality: N/A;   COLONOSCOPY WITH PROPOFOL N/A 12/19/2020   Procedure: COLONOSCOPY WITH PROPOFOL;  Surgeon: [name], MD;  Location: [facility] ENDOSCOPY;  Service: Gastroenterology;  Laterality: N/A;   ESOPHAGOGASTRODUODENOSCOPY (EGD) WITH PROPOFOL N/A 05/07/2017   Procedure: ESOPHAGOGASTRODUODENOSCOPY (EGD) WITH PROPOFOL;  Surgeon: [name], MD;  Location: [facility] ENDOSCOPY;  Service: Gastroenterology;  Laterality: N/A;   FOOT SURGERY Bilateral    INCONTINENCE SURGERY  2009   INTERSTIM IMPLANT PLACEMENT     other     over active bladder   OTHER SURGICAL HISTORY     bladder stimulator    PARTIAL HYSTERECTOMY  03/1996   fibroids   PORTA CATH INSERTION N/A 03/10/2019   Procedure: PORTA CATH INSERTION;  Surgeon: Algernon Huxley, MD;  Location: [place] LAB;  Service: Cardiovascular;  Laterality: N/A;   TONSILLECTOMY  2007    Prior to Admission medications   Medication Sig Start Date End Date Taking? Authorizing Provider  acetaminophen (TYLENOL) 500 MG tablet Take 500 mg by mouth every 6 (six) hours as needed.    [provider]  acyclovir (ZOVIRAX) 400 MG tablet Take 400 mg by mouth 2 (two) times daily. 11/01/20   [provider]  allopurinol (ZYLOPRIM) 100 MG tablet TAKE 1 TABLET(100 MG) BY MOUTH DAILY as needed 12/02/18   Glean Hess, MD  bisoprolol (ZEBETA) 5 MG tablet Take 5 mg by mouth daily. 07/05/20    [provider]  diclofenac sodium (VOLTAREN) 1 % GEL Apply 2 g topically 4 (four) times daily. Patient not taking: Reported on 02/16/2021 01/25/19   Gertie Baron, NP  diphenhydrAMINE (BENADRYL) 50 MG capsule Take 50 mg by mouth as needed.    [provider]  diphenoxylate-atropine (LOMOTIL) 2.5-0.025 MG tablet Take 2 tablets by mouth 4 (four) times daily as needed for diarrhea or loose stools. 01/22/21   Earlie Server, MD  FLUoxetine (PROZAC) 40 MG capsule TAKE 1 CAPSULE EVERY DAY 05/09/20   Glean Hess, MD  fluticasone [facility]) 50 MCG/ACT nasal spray Place 2 sprays into both nostrils daily. 07/10/18   Glean Hess, MD  gabapentin (NEURONTIN) 300 MG capsule Take 1 capsule (300 mg total) by mouth 2 (two) times daily. 02/16/21   Earlie Server, MD  glucose blood test strip  09/04/15   [provider]  lactulose (CHRONULAC) 10 GM/15ML solution Take 20 g by mouth daily as needed. Patient not taking: No sig reported 10/09/19  [provider]  lidocaine (LIDODERM) 5 % 1 patch daily. Patient not taking: No sig reported 06/04/20   [provider]  loperamide (IMODIUM) 2 MG capsule Take 1 capsule (2 mg total) by mouth 4 (four) times daily as needed for diarrhea or loose stools. Patient not taking: Reported on 02/16/2021 11/03/20   Verlon Au, NP  methocarbamol (ROBAXIN) 500 MG tablet TAKE 1 TABLET FOUR TIMES DAILY. 07/12/20   Glean Hess, MD  Multiple Vitamin (MULTIVITAMIN) tablet Take 1 tablet by mouth daily.    [provider]  Multiple Vitamins-Minerals (HAIR/SKIN/NAILS) TABS Take 1 tablet by mouth daily.    [provider]  omeprazole (PRILOSEC) 40 MG capsule Take 1 capsule (40 mg total) by mouth in the morning and at bedtime. 12/05/20   Glean Hess, MD  promethazine (PHENERGAN) 25 MG tablet Take 1 tablet (25 mg total) by mouth every 6 (six) hours as needed for nausea or vomiting. 02/02/21   Earlie Server, MD   promethazine-dextromethorphan (PROMETHAZINE-DM) 6.25-15 MG/5ML syrup Take 5 mLs by mouth 4 (four) times daily as needed for cough. Patient not taking: Reported on 02/16/2021 11/09/20   Verlon Au, NP  traZODone (DESYREL) 100 MG tablet Take 1 tablet (100 mg total) by mouth at bedtime. 12/05/20   Glean Hess, MD  montelukast (SINGULAIR) 10 MG tablet Take 1 tablet by mouth on the day before treatment and for 2 days after treatment. 12/16/19 08/16/20  Lequita Asal, MD    Allergies Oxycodone-acetaminophen, Celebrex [celecoxib], Codeine, Plerixafor, Benadryl [diphenhydramine], Morphine, Ondansetron, and Tylenol [acetaminophen]  Family History  Problem Relation Age of Onset   Colon cancer Father    Renal Disease Father    Diabetes Mellitus II Father    Melanoma Paternal Grandmother    Breast cancer Maternal Aunt 80   Anemia Mother    Heart disease Mother    Heart failure Mother    Renal Disease Mother    Congestive Heart Failure Mother    Heart disease Maternal Uncle    Throat cancer Maternal Uncle    Lung cancer Maternal Uncle    Liver disease Maternal Uncle    Heart failure Maternal Uncle    Hearing loss Son 57       Suicide     Social History Social History   Tobacco Use   Smoking status: Former    Packs/day: 1.00    Years: 20.00    Pack years: 20.00    Types: Cigarettes    Quit date: 07/02/1991    Years since quitting: 29.6   Smokeless tobacco: Never  Vaping Use   Vaping Use: Never used  Substance Use Topics   Alcohol use: Yes    Comment: occasionally   Drug use: No    Review of Systems   Review of Systems  Constitutional:  Positive for chills, fatigue and fever.  Respiratory:  Positive for shortness of breath. Negative for chest tightness.   Cardiovascular:  Negative for chest pain.  Gastrointestinal:  Positive for abdominal pain and nausea. Negative for vomiting.  Genitourinary:  Negative for dysuria.  Neurological:  Negative for headaches.  All  other systems reviewed and are negative.  Physical Exam Updated Vital Signs BP (!) 123/53   Pulse 81   Temp (!) 101.3 F (38.5 C) (Oral)   Resp 18   Ht 5' 3"  (1.6 m)   Wt 69.4 kg   SpO2 94%   BMI 27.10 kg/m   Physical Exam Vitals  and nursing note reviewed.  Constitutional:      General: She is not in acute distress.    Appearance: Normal appearance.  HENT:     Head: Normocephalic and atraumatic.  Eyes:     General: No scleral icterus.    Conjunctiva/sclera: Conjunctivae normal.     Comments: Dry MM  Pulmonary:     Effort: Pulmonary effort is normal. No respiratory distress.     Breath sounds: No stridor.  Abdominal:     Palpations: Abdomen is soft.     Tenderness: There is abdominal tenderness. There is no guarding.     Comments: TTP in the epigastric region and LUQ  Musculoskeletal:        General: No deformity or signs of injury.     Cervical back: Normal range of motion.  Skin:    General: Skin is dry.     Coloration: Skin is not jaundiced or pale.  Neurological:     General: No focal deficit present.     Mental Status: She is alert and oriented to person, place, and time. Mental status is at baseline.  Psychiatric:        Mood and Affect: Mood normal.        Behavior: Behavior normal.     LABS (all labs ordered are listed, but only abnormal results are displayed)  Labs Reviewed  COMPREHENSIVE METABOLIC PANEL - Abnormal; Notable for the following components:      Result Value   Glucose, Bld 174 (*)    BUN 26 (*)    Creatinine, Ser 1.26 (*)    GFR, Estimated 48 (*)    All other components within normal limits  CBC WITH DIFFERENTIAL/PLATELET - Abnormal; Notable for the following components:   RBC 3.65 (*)    Hemoglobin 10.3 (*)    HCT 30.6 (*)    Platelets 130 (*)    Lymphs Abs 0.6 (*)    All other components within normal limits  RESP PANEL BY RT-PCR (FLU A&B, COVID) ARPGX2  CULTURE, BLOOD (ROUTINE X 2)  CULTURE, BLOOD (ROUTINE X 2)  URINE  CULTURE  LIPASE, BLOOD  LACTIC ACID, PLASMA  CBC WITH DIFFERENTIAL/PLATELET  URINALYSIS, COMPLETE (UACMP) WITH MICROSCOPIC  TROPONIN I (HIGH SENSITIVITY)  TROPONIN I (HIGH SENSITIVITY)   ____________________________________________  EKG  Normal sinus rhythm, multiple PVCs, normal axis, normal intervals ____________________________________________  RADIOLOGY I, Madelin Headings, personally viewed and evaluated these images (plain radiographs) as part of my medical decision making, as well as reviewing the written report by the radiologist.  ED MD interpretation: I reviewed the chest x-ray which shows some mild increase in interstitial markings at the right base    ____________________________________________   PROCEDURES  Procedure(s) performed (including Critical Care):  Procedures   ____________________________________________   INITIAL IMPRESSION / ASSESSMENT AND PLAN / ED COURSE     Patient is a 65 year old female on chemotherapy for multiple myeloma who presents with several days of fever.  Her symptoms are vague but she does have some shortness of breath.  She is febrile to one 101.3 satting 94% on room air blood pressure within normal limits.  Overall she is well-appearing, abdomen is benign.  Her abdominal pain seems to be mostly chronic.  Patient's labs so far reassuring with normal lactate, no leukocytosis or neutropenia and creatinine near baseline.  She was given a liter of fluids and ibuprofen.  Continues to be febrile in the ED.  Her chest x-ray is read as potentially right lower  lobe abnormality.  In the setting of her fever and cough this could represent pneumonia.  At the time of signout she is pending a urine.  Blood cultures and urine cultures will be sent.  Will cover with ceftriaxone as a throw for CAP.  Need to also consider bacteremia given she has a port however with no leukocytosis and not meeting any other sepsis criteria I think she can have blood  culture sent and hold off on any further antibiotic coverage. Clinical Course as of 03/05/21 1544  Mon Mar 05, 2021  1454  IMPRESSION: Mild RIGHT basilar atelectasis.     [KM]    Clinical Course User Index [KM] Rada Hay, MD     ____________________________________________   FINAL CLINICAL IMPRESSION(S) / ED DIAGNOSES  Final diagnoses:  Fever, unspecified fever cause     ED Discharge Orders     None        Note:  This document was prepared using Dragon voice recognition software and may include unintentional dictation errors.    Rada Hay, MD 03/05/21 [phone]

## 2021-03-05 NOTE — H&P (Signed)
Chief Complaint: Fever Patient came from home, she is independent of all ADLs. HPI:  Artha Stavros is a 65 year old female with a history of multiple myeloma, chronic kidney disease stage IIIa, type 2 diabetes, chronic diastolic congestive heart failure who present to the hospital complaining of high fever. She has IV port for chemotherapy.  Her last chemotherapy was performed about 10 days ago, she started have daily fever about 5 days ago, she also had chills, the highest temperature was 101.3.  Also has mild cough, nonproductive.  She also has short of breath with exertion.  She also complaining of intermittent lower abdominal pain, she does not have any diarrhea or constipation.  The pain was moderate, associate with nausea, but no vomiting.  ED course. Upon arriving the emergency room, she had a fever of 101.3, heart rate of 71, blood pressure 118/48.  Oxygen saturation 95 on room air.  Lab test showed creatinine 1.26, white cell 7.6, platelets 130.  Chest x-ray showed mild right lower lobe infiltrates.  She is treated with IV antibiotics including Rocephin and Zithromax in the emergency room.    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   Due to [place]   Cardiomyopathy, idiopathic ([place]) 08/21/2017   Overview:  Septal and apical hypokinesis with ef 40%   CHF (congestive heart failure) (HCC)    CKD (chronic kidney disease) stage 3, GFR 30-59 ml/min (HCC)    Depression    Diabetes mellitus (HCC)    Dizziness    Fatty liver    Frequent falls    GERD (gastroesophageal reflux disease)    Gout    Heart murmur    History of blood transfusion    History of bone marrow transplant ([place])    History of uterine fibroid    Hx of cardiac catheterization 06/05/2016    Overview:  Normal coronaries 2017   Hypertension    Hypomagnesemia    IDA (iron deficiency anemia)    Multiple myeloma ([place])    Personal history of chemotherapy    Renal cyst    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[place]) 06/05/2016   Overview:  Sp ablation 1999    Past Surgical History:  Procedure Laterality Date   ABDOMINAL HYSTERECTOMY     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   CARDIAC ELECTROPHYSIOLOGY MAPPING AND ABLATION     CARPAL TUNNEL RELEASE Bilateral    CHOLECYSTECTOMY  2008   COLONOSCOPY WITH PROPOFOL N/A 05/07/2017   Procedure: COLONOSCOPY WITH PROPOFOL;  Surgeon: [name], MD;  Location: [facility] ENDOSCOPY;  Service: Gastroenterology;  Laterality: N/A;   COLONOSCOPY WITH PROPOFOL N/A 12/19/2020   Procedure: COLONOSCOPY WITH PROPOFOL;  Surgeon: [name], MD;  Location: [facility] ENDOSCOPY;  Service: Gastroenterology;  Laterality: N/A;   ESOPHAGOGASTRODUODENOSCOPY (EGD) WITH PROPOFOL N/A 05/07/2017   Procedure: ESOPHAGOGASTRODUODENOSCOPY (EGD) WITH PROPOFOL;  Surgeon: [name], MD;  Location: [facility] ENDOSCOPY;  Service: Gastroenterology;  Laterality: N/A;   FOOT SURGERY Bilateral    INCONTINENCE SURGERY  2009   INTERSTIM IMPLANT PLACEMENT     other     over active bladder   OTHER SURGICAL HISTORY     bladder stimulator    PARTIAL HYSTERECTOMY  03/1996   fibroids   PORTA CATH INSERTION N/A 03/10/2019  Procedure: PORTA CATH INSERTION;  Surgeon: Algernon Huxley, MD;  Location: [facility];  Service: Cardiovascular;  Laterality: N/A;   TONSILLECTOMY  2007    Family History  Problem Relation Age of Onset   Colon cancer Father    Renal Disease Father    Diabetes Mellitus II Father    Melanoma Paternal Grandmother    Breast cancer Maternal Aunt 4   Anemia Mother    Heart disease Mother    Heart failure Mother    Renal Disease Mother    Congestive Heart Failure Mother    Heart disease Maternal Uncle    Throat cancer Maternal Uncle    Lung cancer Maternal Uncle    Liver  disease Maternal Uncle    Heart failure Maternal Uncle    Hearing loss Son 85       Suicide    Social History:  reports that she quit smoking about 29 years ago. Her smoking use included cigarettes. She has a 20.00 pack-year smoking history. She has never used smokeless tobacco. She reports current alcohol use. She reports that she does not use drugs.  Allergies:  Allergies  Allergen Reactions   Oxycodone-Acetaminophen Anaphylaxis    Swelling and rash   Celebrex [Celecoxib] Diarrhea   Codeine    Plerixafor     In 2016 during ASCT collection patient developed fever to 103.58F and required hospitalization   Benadryl [Diphenhydramine] Palpitations   Morphine Itching and Rash   Ondansetron Diarrhea   Tylenol [Acetaminophen] Itching and Rash    (Not in a hospital admission)   Results for orders placed or performed during the hospital encounter of 03/05/21 (from the past 48 hour(s))  Comprehensive metabolic panel     Status: Abnormal   Collection Time: 03/05/21  1:57 PM  Result Value Ref Range   Sodium 137 135 - 145 mmol/L   Potassium 4.4 3.5 - 5.1 mmol/L   Chloride 103 98 - 111 mmol/L   CO2 25 22 - 32 mmol/L   Glucose, Bld 174 (H) 70 - 99 mg/dL    Comment: Glucose reference range applies only to samples taken after fasting for at least 8 hours.   BUN 26 (H) 8 - 23 mg/dL   Creatinine, Ser 1.26 (H) 0.44 - 1.00 mg/dL   Calcium 9.1 8.9 - 10.3 mg/dL   Total Protein 6.6 6.5 - 8.1 g/dL   Albumin 3.5 3.5 - 5.0 g/dL   AST 25 15 - 41 U/L   ALT 24 0 - 44 U/L   Alkaline Phosphatase 111 38 - 126 U/L   Total Bilirubin 0.5 0.3 - 1.2 mg/dL   GFR, Estimated 48 (L) >60 mL/min    Comment: (NOTE) Calculated using the CKD-EPI Creatinine Equation (2021)    Anion gap 9 5 - 15    Comment: Performed at [facility], [address] [place], [place] [address]  Lipase, blood     Status: None   Collection Time: 03/05/21  1:57 PM  Result Value Ref Range   Lipase 28 11 - 51 U/L     Comment: Performed at [facility], [address] [place], [place] [address]  Lactic acid, plasma     Status: None   Collection Time: 03/05/21  1:57 PM  Result Value Ref Range   Lactic Acid, Venous 1.5 0.5 - 1.9 mmol/L    Comment: Performed at [facility], [address], [place], [place] [address]  Troponin I (High Sensitivity)  Status: None   Collection Time: 03/05/21  1:57 PM  Result Value Ref Range   Troponin I (High Sensitivity) 4 <18 ng/L    Comment: (NOTE) Elevated high sensitivity troponin I (hsTnI) values and significant  changes across serial measurements may suggest ACS but many other  chronic and acute conditions are known to elevate hsTnI results.  Refer to the "Links" section for chest pain algorithms and additional  guidance. Performed at [facility], [address] [place], [place] [address]   CBC with Differential/Platelet     Status: None (Preliminary result)   Collection Time: 03/05/21  1:57 PM  Result Value Ref Range   WBC PENDING 4.0 - 10.5 K/uL   RBC PENDING 3.87 - 5.11 MIL/uL   Hemoglobin PENDING 12.0 - 15.0 g/dL   HCT PENDING 36.0 - 46.0 %   MCV PENDING 80.0 - 100.0 fL   MCH PENDING 26.0 - 34.0 pg   MCHC PENDING 30.0 - 36.0 g/dL   RDW PENDING 11.5 - 15.5 %   Platelets PENDING 150 - 400 K/uL   nRBC PENDING 0.0 - 0.2 %   Neutrophils Relative % DUPLICATE REQUEST %   Neutro Abs DUPLICATE REQUEST 1.7 - 7.7 K/uL   Band Neutrophils DUPLICATE REQUEST %   Lymphocytes Relative DUPLICATE REQUEST %   Lymphs Abs DUPLICATE REQUEST 0.7 - 4.0 K/uL   Monocytes Relative DUPLICATE REQUEST %   Monocytes Absolute DUPLICATE REQUEST 0.1 - 1.0 K/uL   Eosinophils Relative DUPLICATE REQUEST %   Eosinophils Absolute DUPLICATE REQUEST 0.0 - 0.5 K/uL   Basophils Relative DUPLICATE REQUEST %   Basophils Absolute DUPLICATE REQUEST 0.0 - 0.1 K/uL   WBC Morphology DUPLICATE REQUEST    RBC Morphology DUPLICATE REQUEST    Smear Review DUPLICATE  REQUEST    Other DUPLICATE REQUEST %   nRBC DUPLICATE REQUEST 0 /366 WBC   Metamyelocytes Relative DUPLICATE REQUEST %   Myelocytes DUPLICATE REQUEST %   Promyelocytes Relative DUPLICATE REQUEST %   Blasts DUPLICATE REQUEST %   Immature Granulocytes DUPLICATE REQUEST %   Abs Immature Granulocytes DUPLICATE REQUEST 4.40 - 0.07 K/uL  CBC with Differential/Platelet     Status: Abnormal   Collection Time: 03/05/21  1:57 PM  Result Value Ref Range   WBC 7.6 4.0 - 10.5 K/uL   RBC 3.65 (L) 3.87 - 5.11 MIL/uL   Hemoglobin 10.3 (L) 12.0 - 15.0 g/dL   HCT 30.6 (L) 36.0 - 46.0 %   MCV 83.8 80.0 - 100.0 fL   MCH 28.2 26.0 - 34.0 pg   MCHC 33.7 30.0 - 36.0 g/dL   RDW 13.0 11.5 - 15.5 %   Platelets 130 (L) 150 - 400 K/uL   nRBC 0.0 0.0 - 0.2 %   Neutrophils Relative % 80 %   Neutro Abs 6.0 1.7 - 7.7 K/uL   Lymphocytes Relative 8 %   Lymphs Abs 0.6 (L) 0.7 - 4.0 K/uL   Monocytes Relative 8 %   Monocytes Absolute 0.6 0.1 - 1.0 K/uL   Eosinophils Relative 3 %   Eosinophils Absolute 0.3 0.0 - 0.5 K/uL   Basophils Relative 0 %   Basophils Absolute 0.0 0.0 - 0.1 K/uL   Immature Granulocytes 1 %   Abs Immature Granulocytes 0.05 0.00 - 0.07 K/uL    Comment: Performed at [facility], [place], [place] [address]  Troponin I (High Sensitivity)     Status: None   Collection Time: 03/05/21  1:57 PM  Result Value Ref Range   Troponin I (High Sensitivity) 5 <18 ng/L    Comment: (NOTE) Elevated high sensitivity troponin I (hsTnI) values and significant  changes across serial measurements may suggest ACS but many other  chronic and acute conditions are known to elevate hsTnI results.  Refer to the "Links" section for chest pain algorithms and additional  guidance. Performed at [facility], [address] [place], [place] [address]   Resp Panel by RT-PCR (Flu A&B, Covid) Nasopharyngeal Swab     Status: None   Collection Time: 03/05/21  1:58 PM   Specimen:  Nasopharyngeal Swab; Nasopharyngeal(NP) swabs in vial transport medium  Result Value Ref Range   SARS Coronavirus 2 by RT PCR NEGATIVE NEGATIVE    Comment: (NOTE) SARS-CoV-2 target nucleic acids are NOT DETECTED.  The SARS-CoV-2 RNA is generally detectable in upper respiratory specimens during the acute phase of infection. The lowest concentration of SARS-CoV-2 viral copies this assay can detect is 138 copies/mL. A negative result does not preclude SARS-Cov-2 infection and should not be used as the sole basis for treatment or other patient management decisions. A negative result may occur with  improper specimen collection/handling, submission of specimen other than nasopharyngeal swab, presence of viral mutation(s) within the areas targeted by this assay, and inadequate number of viral copies(<138 copies/mL). A negative result must be combined with clinical observations, patient history, and epidemiological information. The expected result is Negative.  Fact Sheet for Patients:  EntrepreneurPulse.com.au  Fact Sheet for Healthcare Providers:  IncredibleEmployment.be  This test is no t yet approved or cleared by the [place] FDA and  has been authorized for detection and/or diagnosis of SARS-CoV-2 by FDA under an Emergency Use Authorization (EUA). This EUA will remain  in effect (meaning this test can be used) for the duration of the COVID-19 declaration under Section 564(b)(1) of the Act, 21 U.S.C.section 360bbb-3(b)(1), unless the authorization is terminated  or revoked sooner.       Influenza A by PCR NEGATIVE NEGATIVE   Influenza B by PCR NEGATIVE NEGATIVE    Comment: (NOTE) The Xpert Xpress SARS-CoV-2/FLU/RSV plus assay is intended as an aid in the diagnosis of influenza from Nasopharyngeal swab specimens and should not be used as a sole basis for treatment. Nasal washings and aspirates are unacceptable for Xpert Xpress  SARS-CoV-2/FLU/RSV testing.  Fact Sheet for Patients: EntrepreneurPulse.com.au  Fact Sheet for Healthcare Providers: IncredibleEmployment.be  This test is not yet approved or cleared by the [place] FDA and has been authorized for detection and/or diagnosis of SARS-CoV-2 by FDA under an Emergency Use Authorization (EUA). This EUA will remain in effect (meaning this test can be used) for the duration of the COVID-19 declaration under Section 564(b)(1) of the Act, 21 U.S.C. section 360bbb-3(b)(1), unless the authorization is terminated or revoked.  Performed at [facility], [address] [place], [place] [address]    *Note: Due to a large number of results and/or encounters for the requested time period, some results have not been displayed. A complete set of results can be found in Results Review.   DG Chest Portable 1 View  Result Date: 03/05/2021 CLINICAL DATA:  Fever for couple days, multiple myeloma post bone marrow transplant last November, nausea, vomiting EXAM: PORTABLE CHEST 1 VIEW COMPARISON:  Portable exam 1341 hours compared to 11/09/2020 FINDINGS: RIGHT jugular Port-A-Cath with tip projecting over SVC. Normal heart size, mediastinal contours, and pulmonary vascularity. Mild RIGHT basilar atelectasis with minimal accentuation of interstitial  markings versus prior study. Lungs otherwise clear. No definite infiltrate, pleural effusion, or pneumothorax. No acute osseous findings. IMPRESSION: Mild RIGHT basilar atelectasis. Electronically Signed   By: Lavonia Dana M.D.   On: 03/05/2021 14:02    Review of Systems  Constitutional:  Positive for chills, fatigue and fever. Negative for activity change and unexpected weight change.  HENT:  Positive for sore throat. Negative for ear pain and mouth sores.   Eyes:  Negative for pain and redness.  Respiratory:  Positive for cough, choking and shortness of breath.        Dysphagia.   Cardiovascular:  Negative for chest pain, palpitations and leg swelling.  Gastrointestinal:  Positive for abdominal pain and nausea. Negative for abdominal distention, blood in stool, constipation, diarrhea and vomiting.  Endocrine: Negative for polydipsia, polyphagia and polyuria.  Genitourinary:  Negative for dysuria and hematuria.  Musculoskeletal:  Negative for back pain, myalgias and neck pain.  Skin:  Negative for rash and wound.  Neurological:  Negative for dizziness, syncope and light-headedness.  Psychiatric/Behavioral:  Negative for confusion and hallucinations.    Blood pressure 122/62, pulse 77, temperature (!) 101.3 F (38.5 C), temperature source Oral, resp. rate 17, height _0  (1.6 m), weight 69.4 kg, SpO2 93 %. Physical Exam Constitutional:      General: She is not in acute distress.    Appearance: She is not ill-appearing, toxic-appearing or diaphoretic.  HENT:     Head: Normocephalic and atraumatic.     Nose: Nose normal. No congestion.  Eyes:     Extraocular Movements: Extraocular movements intact.     Conjunctiva/sclera: Conjunctivae normal.     Pupils: Pupils are equal, round, and reactive to light.  Neck:     Vascular: No carotid bruit.  Cardiovascular:     Rate and Rhythm: Normal rate and regular rhythm.     Heart sounds: No murmur heard.   No friction rub. No gallop.  Pulmonary:     Effort: Pulmonary effort is normal. No respiratory distress.     Breath sounds: Normal breath sounds. No wheezing or rales.  Abdominal:     General: Abdomen is flat. Bowel sounds are normal.     Palpations: Abdomen is soft.     Tenderness: There is abdominal tenderness.     Comments: Right lower quadrant tenderness  Musculoskeletal:        General: No swelling or tenderness. Normal range of motion.     Cervical back: Normal range of motion and neck supple.  Lymphadenopathy:     Cervical: Cervical adenopathy present.  Skin:    General: Skin is warm and dry.      Coloration: Skin is not jaundiced.  Neurological:     General: No focal deficit present.     Mental Status: She is alert and oriented to person, place, and time.     Cranial Nerves: No cranial nerve deficit.     Sensory: No sensory deficit.  Psychiatric:        Mood and Affect: Mood normal.        Thought Content: Thought content normal.        Judgment: Judgment normal.     Assessment/Plan Fever, not meet sepsis criteria. Likely right lower lobe pneumonia. Right lower quadrant abdominal pain, need to rule out appendicitis. IV port, need to rule out bacteremia. I personally reviewed patient chest x-ray, there is a possibility of right lower pneumonia. Patient also has dysphagia, but never had aspiration pneumonia in the  past.  She is scheduled to have EGD in the near future. She has not had any choking episode recently.  Do not feel aspiration was the source of pneumonia. Another consideration of her fever is supportive infection.  I will cover with cefepime and vancomycin for now. I will also obtain CT abdomen/pelvis with contrast to rule out appendicitis. Patient is also immunocompromised with recent chemotherapy and multiple myeloma, she will be covered with broad spectrum antibiotics.  Multiple myeloma. Follow-up with neurology as outpatient.  Chronic diastolic congestive heart failure. Reviewed the recent echocardiogram performed in 5/21, ejection fraction 60 to 65%.  Patient currently does not have any evidence of volume overload.  Chronic kidney disease stage IIIa. Renal function still stable, will continue to follow.  Mild thrombocytopenia. Secondary to chemotherapy and multiple myeloma.  Continue to follow.     Sharen Hones, MD 03/05/2021, 5:04 PM

## 2021-03-05 NOTE — Consult Note (Signed)
Pharmacy Antibiotic Note  Sarah [name] is a 65 y.o. female admitted on 03/05/2021 with sepsis/fever. Patient with hx of multiple myeloma actively undergoing IV chemotherapy. Pharmacy has been consulted for vancomycin and cefepime dosing.  Plan: Cefepime 2 gram q12h based on renal function Vancomycin 1500 mg LD x 1 ordered Vancomycin 750 mg Q24H. Goal AUC 400-550 Estimated AUC 424/Cmin: 11.6 Scr. 1.26, IBW, Vd 0.72 Follow up renal function and cultures   Height: 5' 3"  (160 cm) Weight: 69.4 kg (153 lb) IBW/kg (Calculated) : 52.4  Temp (24hrs), Avg:101.2 F (38.4 C), Min:101.1 F (38.4 C), Max:101.3 F (38.5 C)  Recent Labs  Lab 03/05/21 1357  WBC 7.6  PENDING  CREATININE 1.26*  LATICACIDVEN 1.5    Estimated Creatinine Clearance: 42.2 mL/min (A) (by C-G formula based on SCr of 1.26 mg/dL (H)).    Allergies  Allergen Reactions   Oxycodone-Acetaminophen Anaphylaxis    Swelling and rash   Celebrex [Celecoxib] Diarrhea   Codeine    Plerixafor     In 2016 during ASCT collection patient developed fever to 103.29F and required hospitalization   Benadryl [Diphenhydramine] Palpitations   Morphine Itching and Rash   Ondansetron Diarrhea   Tylenol [Acetaminophen] Itching and Rash    Antimicrobials this admission: 9/5 ceftriaxone x 1 9/5 azithromycin>> 9/5 cefepime >>  9/5 vancomycin >>  9/5 metronidazole >>  Dose adjustments this admission: N/a  Microbiology results: 9/5 BCx: sent 9/5 UCx: sent   Thank you for allowing pharmacy to be a part of this patient's care.  Dorothe Pea, PharmD, BCPS Clinical Pharmacist   03/05/2021 7:13 PM

## 2021-03-05 NOTE — Progress Notes (Signed)
Pt requesting night time meds. Notified Sharion Settler, NP. Verbal orders to hold bisoprolol tonight due to low HR.

## 2021-03-05 NOTE — ED Notes (Signed)
Pt changed into gown, placed on 5 lead, O2, BP.

## 2021-03-05 NOTE — ED Triage Notes (Signed)
Pt to ED for fever for past couple days. Has multiple myeloma. Reports bone marrow transplant last November.  +nausea/vomiting

## 2021-03-05 NOTE — Progress Notes (Signed)
Skin swarm completed with Kennyth Lose RN no skin issues identified. Oriented to room and call light system.

## 2021-03-06 LAB — BASIC METABOLIC PANEL
Anion gap: 8 (ref 5–15)
BUN: 25 mg/dL — ABNORMAL HIGH (ref 8–23)
CO2: 24 mmol/L (ref 22–32)
Calcium: 8.3 mg/dL — ABNORMAL LOW (ref 8.9–10.3)
Chloride: 104 mmol/L (ref 98–111)
Creatinine, Ser: 1.15 mg/dL — ABNORMAL HIGH (ref 0.44–1.00)
GFR, Estimated: 53 mL/min — ABNORMAL LOW (ref >60)
Glucose, Bld: 149 mg/dL — ABNORMAL HIGH (ref 70–99)
Potassium: 4.1 mmol/L (ref 3.5–5.1)
Sodium: 136 mmol/L (ref 135–145)

## 2021-03-06 LAB — CBC
HCT: 29.8 % — ABNORMAL LOW (ref 36.0–46.0)
Hemoglobin: 10.2 g/dL — ABNORMAL LOW (ref 12.0–15.0)
MCH: 28.3 pg (ref 26.0–34.0)
MCHC: 34.2 g/dL (ref 30.0–36.0)
MCV: 82.8 fL (ref 80.0–100.0)
Platelets: 132 10*3/uL — ABNORMAL LOW (ref 150–400)
RBC: 3.6 MIL/uL — ABNORMAL LOW (ref 3.87–5.11)
RDW: 13.1 % (ref 11.5–15.5)
WBC: 8.9 10*3/uL (ref 4.0–10.5)
nRBC: 0 % (ref 0.0–0.2)

## 2021-03-06 LAB — PROCALCITONIN: Procalcitonin: <0.1 ng/mL

## 2021-03-06 LAB — PROTIME-INR
INR: 1.1 (ref 0.8–1.2)
Prothrombin Time: 14.5 seconds (ref 11.4–15.2)

## 2021-03-06 LAB — CORTISOL-AM, BLOOD: Cortisol - AM: 13.7 ug/dL (ref 6.7–22.6)

## 2021-03-06 LAB — HIV ANTIBODY (ROUTINE TESTING W REFLEX): HIV Screen 4th Generation wRfx: NONREACTIVE

## 2021-03-06 LAB — MRSA NEXT GEN BY PCR, NASAL: MRSA by PCR Next Gen: NOT DETECTED

## 2021-03-06 LAB — MAGNESIUM: Magnesium: 1.3 mg/dL — ABNORMAL LOW (ref 1.7–2.4)

## 2021-03-06 LAB — PHOSPHORUS: Phosphorus: 3.6 mg/dL (ref 2.5–4.6)

## 2021-03-06 MED ORDER — IBUPROFEN 400 MG PO TABS
200.0000 mg | ORAL_TABLET | Freq: Four times a day (QID) | ORAL | Status: DC | PRN
  Administered 2021-03-06 – 2021-03-08 (×5): 200 mg via ORAL
  Filled 2021-03-06 (×5): qty 1

## 2021-03-06 MED ORDER — MAGNESIUM SULFATE 2 GM/50ML IV SOLN
2.0000 g | Freq: Once | INTRAVENOUS | Status: AC
  Administered 2021-03-06: 2 g via INTRAVENOUS
  Filled 2021-03-06: qty 50

## 2021-03-06 MED ORDER — ACYCLOVIR 200 MG PO CAPS
400.0000 mg | ORAL_CAPSULE | Freq: Two times a day (BID) | ORAL | Status: DC
  Administered 2021-03-06 – 2021-03-09 (×7): 400 mg via ORAL
  Filled 2021-03-06 (×8): qty 2

## 2021-03-06 MED ORDER — CHLORHEXIDINE GLUCONATE CLOTH 2 % EX PADS
6.0000 | MEDICATED_PAD | Freq: Every day | CUTANEOUS | Status: DC
  Administered 2021-03-06 – 2021-03-09 (×4): 6 via TOPICAL

## 2021-03-06 NOTE — Progress Notes (Signed)
   03/06/21 1243  Clinical Encounter Type  Visited With Patient  Visit Type Follow-up  Referral From Nurse  Consult/Referral To [facility] provided information about AD documents. The Pt received the pamphlet and stated she will fill it out later and she will call when she is ready.

## 2021-03-06 NOTE — Plan of Care (Addendum)
Fever of 103F orally, given '200mg'$  ibuprofen and placed on cooling blanket. Temp down to 109F this morning, cooling blanket removed. Pt now denies any chills or pains. One episode of emesis, phenergan given x2 with relief. Urine sent this shift.   Problem: Health Behavior/Discharge Planning: Goal: Ability to manage health-related needs will improve Outcome: Progressing   Problem: Clinical Measurements: Goal: Ability to maintain clinical measurements within normal limits will improve Outcome: Progressing Goal: Will remain free from infection Outcome: Progressing Goal: Diagnostic test results will improve Outcome: Progressing Goal: Respiratory complications will improve Outcome: Progressing Goal: Cardiovascular complication will be avoided Outcome: Progressing

## 2021-03-06 NOTE — Progress Notes (Signed)
   03/06/21 0302  Assess: MEWS Score  Temp (!) 100.9 F (38.3 C)  BP (!) 115/47  Pulse Rate 81  Resp 15  Level of Consciousness Alert  SpO2 95 %  Assess: MEWS Score  MEWS Temp 1  MEWS Systolic 0  MEWS Pulse 0  MEWS RR 0  MEWS LOC 0  MEWS Score 1  MEWS Score Color Green  Assess: if the MEWS score is Yellow or Red  Were vital signs taken at a resting state? Yes  Focused Assessment No change from prior assessment  Does the patient meet 2 or more of the SIRS criteria? No  MEWS guidelines implemented *See Row Information* Yes  Treat  MEWS Interventions Administered scheduled meds/treatments;Other (Comment) (cooling blanket and ibuprofen)  Document  Patient Outcome Other (Comment) (temperature decreasing)  Progress note created (see row info) Yes  Assess: SIRS CRITERIA  SIRS Temperature  0  SIRS Pulse 0  SIRS Respirations  0  SIRS WBC 0  SIRS Score Sum  0

## 2021-03-06 NOTE — Progress Notes (Signed)
PROGRESS NOTE    Sarah [name]  FUX:[id number] DOB: [birth year] DOA: 03/05/2021 PCP: Glean Hess, MD   Chief Complain: Fever  Brief Narrative: Patient is a 65 year old female with history of multiple myeloma, chronic kidney disease stage IIIa, type 2 diabetes, chronic diastolic congestive heart failure who presented here with complaints of high-grade fever.  She has IV port for chemotherapy, last chemo was about 10 days ago, started having fever 5 days ago with chills.  Also complained of shortness of breath, mild cough which was nonproductive.  Patient also reported intermittent lower abdominal pain without diarrhea or constipation.  On presentation she was febrile at 101.3 Fahrenheit, blood pressure was stable.  Chest x-ray showed mild right lower lobe infiltrate.  Currently on vancomycin and cefepime.  Assessment & Plan:   Active Problems:   Multiple myeloma not having achieved remission (HCC)   Right lower lobe pneumonia   Fever in adult   Fever /possible right lower lobe pneumonia: Presented with fever, shortness of breath, cough.  Chest x-ray showed  right lower lobe atelectasis vs pneumonia.  Started on ceftriaxone azithromycin.  She is  still febrile this morning.  We will follow-up blood cultures. UA was not that impressive for UTI.  Patient denies any dysuria.  Urine culture will be followed.  Abdominal pain: CT abdomen/pelvis did not show any intra-abdominal findings.  Multiple myeloma: Follows with oncology as an outpatient.  Has a Chemo-Port.  Last chemo about 10 days ago from admission date.  Chronic congestive heart failure: No evidence of volume overload.  Last echo done in 5/21 showed EF of 60 to 65%.  CKD stage IIIa: Currently kidney function stable  Mild thrombocytopenia: Most likely secondary to chemotherapy/multiple myeloma.  Continue to monitor  Hypomagnesemia: Currently being supplemented         DVT prophylaxis:Lovenox Code Status: Full Family  Communication: None at bedside Status is: Inpatient  Remains inpatient appropriate because:Inpatient level of care appropriate due to severity of illness  Dispo: The patient is from: Home              Anticipated d/c is to: Home              Patient currently is medically stable to d/c.   Difficult to place patient No    Consultants: None  Procedures:None  Antimicrobials:  Anti-infectives (From admission, onward)    Start     Dose/Rate Route Frequency Ordered Stop   03/06/21 2000  vancomycin (VANCOREADY) IVPB 750 mg/150 mL        750 mg 150 mL/hr over 60 Minutes Intravenous Every 24 hours 03/05/21 1920     03/06/21 1000  azithromycin (ZITHROMAX) tablet 500 mg        500 mg Oral Daily 03/05/21 1711     03/05/21 1830  vancomycin (VANCOREADY) IVPB 1500 mg/300 mL        1,500 mg 150 mL/hr over 120 Minutes Intravenous  Once 03/05/21 1709 03/05/21 2154   03/05/21 1800  metroNIDAZOLE (FLAGYL) IVPB 500 mg        500 mg 100 mL/hr over 60 Minutes Intravenous Every 12 hours 03/05/21 1702 03/12/21 1759   03/05/21 1800  ceFEPIme (MAXIPIME) 2 g in sodium chloride 0.9 % 100 mL IVPB        2 g 200 mL/hr over 30 Minutes Intravenous Every 12 hours 03/05/21 1706     03/05/21 1715  ceFEPIme (MAXIPIME) 2 g in sodium chloride 0.9 % 100 mL IVPB  Status:  Discontinued        2 g 200 mL/hr over 30 Minutes Intravenous  Once 03/05/21 1702 03/05/21 1706   03/05/21 1715  vancomycin (VANCOCIN) IVPB 1000 mg/200 mL premix  Status:  Discontinued        1,000 mg 200 mL/hr over 60 Minutes Intravenous  Once 03/05/21 1702 03/05/21 1709   03/05/21 1545  cefTRIAXone (ROCEPHIN) 1 g in sodium chloride 0.9 % 100 mL IVPB        1 g 200 mL/hr over 30 Minutes Intravenous  Once 03/05/21 1532 03/05/21 1615   03/05/21 1545  azithromycin (ZITHROMAX) tablet 500 mg        500 mg Oral  Once 03/05/21 1532 03/05/21 1541       Subjective: Patient seen and examined at the bedside this morning.  Hemodynamically stable.   She was febrile earlier this morning but not febrile during my evaluation.  She says she feels better today.  Denies any new complaints   Objective: Vitals:   03/05/21 1946 03/06/21 0059 03/06/21 0302 03/06/21 0543  BP: 131/63 130/80 (!) 115/47 (!) 105/50  Pulse: 69 88 81 67  Resp: 18 16 15 15   Temp: 98.2 F (36.8 C) (!) 103 F (39.4 C) (!) 100.9 F (38.3 C) (!) 97.1 F (36.2 C)  TempSrc: Oral   Oral  SpO2: 98% 95% 95%   Weight: 71.3 kg     Height: 5' 3"  (1.6 m)       Intake/Output Summary (Last 24 hours) at 03/06/2021 0754 Last data filed at 03/06/2021 0641 Gross per 24 hour  Intake 1942.6 ml  Output 800 ml  Net 1142.6 ml   Filed Weights   03/05/21 1303 03/05/21 1946  Weight: 69.4 kg 71.3 kg    Examination:  General exam: Overall comfortable, not in distress HEENT: PERRL Respiratory system:  no wheezes or crackles  Cardiovascular system: S1 & S2 heard, RRR.  Chemo-Port on the right chest Gastrointestinal system: Abdomen is nondistended, soft and nontender. Central nervous system: Alert and oriented Extremities: No edema, no clubbing ,no cyanosis Skin: No rashes, no ulcers,no icterus      Data Reviewed: I have personally reviewed following labs and imaging studies  CBC: Recent Labs  Lab 03/05/21 1357 03/06/21 0530  WBC 7.6  PENDING 8.9  NEUTROABS 6.0  DUPLICATE REQUEST  --   HGB 10.3*  PENDING 10.2*  HCT 30.6*  PENDING 29.8*  MCV 83.8  PENDING 82.8  PLT 130*  PENDING 174*   Basic Metabolic Panel: Recent Labs  Lab 02/27/21 0923 03/05/21 1357 03/06/21 0530  NA  --  137 136  K  --  4.4 4.1  CL  --  103 104  CO2  --  25 24  GLUCOSE  --  174* 149*  BUN  --  26* 25*  CREATININE  --  1.26* 1.15*  CALCIUM  --  9.1 8.3*  MG 1.3*  --  1.3*  PHOS  --   --  3.6   GFR: Estimated Creatinine Clearance: 46.8 mL/min (A) (by C-G formula based on SCr of 1.15 mg/dL (H)). Liver Function Tests: Recent Labs  Lab 03/05/21 1357  AST 25  ALT 24  ALKPHOS 111   BILITOT 0.5  PROT 6.6  ALBUMIN 3.5   Recent Labs  Lab 03/05/21 1357  LIPASE 28   No results for input(s): AMMONIA in the last 168 hours. Coagulation Profile: Recent Labs  Lab 03/06/21 0530  INR 1.1   Cardiac Enzymes: No  results for input(s): CKTOTAL, CKMB, CKMBINDEX, TROPONINI in the last 168 hours. BNP (last 3 results) No results for input(s): PROBNP in the last 8760 hours. HbA1C: No results for input(s): HGBA1C in the last 72 hours. CBG: No results for input(s): GLUCAP in the last 168 hours. Lipid Profile: No results for input(s): CHOL, HDL, LDLCALC, TRIG, CHOLHDL, LDLDIRECT in the last 72 hours. Thyroid Function Tests: No results for input(s): TSH, T4TOTAL, FREET4, T3FREE, THYROIDAB in the last 72 hours. Anemia Panel: No results for input(s): VITAMINB12, FOLATE, FERRITIN, TIBC, IRON, RETICCTPCT in the last 72 hours. Sepsis Labs: Recent Labs  Lab 03/05/21 1357 03/06/21 0530  PROCALCITON <0.10 <0.10  LATICACIDVEN 1.5  --     Recent Results (from the past 240 hour(s))  Blood culture (routine x 2)     Status: None (Preliminary result)   Collection Time: 03/05/21  1:58 PM   Specimen: BLOOD  Result Value Ref Range Status   Specimen Description BLOOD BLOOD RIGHT FOREARM  Final   Special Requests   Final    BOTTLES DRAWN AEROBIC AND ANAEROBIC Blood Culture adequate volume   Culture   Final    NO GROWTH < 24 HOURS Performed at [facility], [address] [place], [place] [address]    Report Status PENDING  Incomplete  Resp Panel by RT-PCR (Flu A&B, Covid) Nasopharyngeal Swab     Status: None   Collection Time: 03/05/21  1:58 PM   Specimen: Nasopharyngeal Swab; Nasopharyngeal(NP) swabs in vial transport medium  Result Value Ref Range Status   SARS Coronavirus 2 by RT PCR NEGATIVE NEGATIVE Final    Comment: (NOTE) SARS-CoV-2 target nucleic acids are NOT DETECTED.  The SARS-CoV-2 RNA is generally detectable in upper respiratory specimens during the  acute phase of infection. The lowest concentration of SARS-CoV-2 viral copies this assay can detect is 138 copies/mL. A negative result does not preclude SARS-Cov-2 infection and should not be used as the sole basis for treatment or other patient management decisions. A negative result may occur with  improper specimen collection/handling, submission of specimen other than nasopharyngeal swab, presence of viral mutation(s) within the areas targeted by this assay, and inadequate number of viral copies(<138 copies/mL). A negative result must be combined with clinical observations, patient history, and epidemiological information. The expected result is Negative.  Fact Sheet for Patients:  EntrepreneurPulse.com.au  Fact Sheet for Healthcare Providers:  IncredibleEmployment.be  This test is no t yet approved or cleared by the [place] FDA and  has been authorized for detection and/or diagnosis of SARS-CoV-2 by FDA under an Emergency Use Authorization (EUA). This EUA will remain  in effect (meaning this test can be used) for the duration of the COVID-19 declaration under Section 564(b)(1) of the Act, 21 U.S.C.section 360bbb-3(b)(1), unless the authorization is terminated  or revoked sooner.       Influenza A by PCR NEGATIVE NEGATIVE Final   Influenza B by PCR NEGATIVE NEGATIVE Final    Comment: (NOTE) The Xpert Xpress SARS-CoV-2/FLU/RSV plus assay is intended as an aid in the diagnosis of influenza from Nasopharyngeal swab specimens and should not be used as a sole basis for treatment. Nasal washings and aspirates are unacceptable for Xpert Xpress SARS-CoV-2/FLU/RSV testing.  Fact Sheet for Patients: EntrepreneurPulse.com.au  Fact Sheet for Healthcare Providers: IncredibleEmployment.be  This test is not yet approved or cleared by the [place] FDA and has been authorized for detection and/or  diagnosis of SARS-CoV-2 by FDA under an Emergency Use Authorization (EUA).  This EUA will remain in effect (meaning this test can be used) for the duration of the COVID-19 declaration under Section 564(b)(1) of the Act, 21 U.S.C. section 360bbb-3(b)(1), unless the authorization is terminated or revoked.  Performed at [facility], [address] [place], [place] [address]   Blood culture (routine x 2)     Status: None (Preliminary result)   Collection Time: 03/05/21  2:07 PM   Specimen: BLOOD  Result Value Ref Range Status   Specimen Description BLOOD PORTA CATH  Final   Special Requests   Final    BOTTLES DRAWN AEROBIC AND ANAEROBIC Blood Culture adequate volume   Culture   Final    NO GROWTH < 24 HOURS Performed at [facility], [address] [place], [place] [address]    Report Status PENDING  Incomplete         Radiology Studies: CT ABDOMEN PELVIS W CONTRAST  Result Date: 03/05/2021 CLINICAL DATA:  Provided history of: Appendicitis suspected, intermediate risk, no prior imaging (Ped 0-18y) Technologist notes state: Fever. Intermittent lower abdominal pain. History of multiple myeloma, chronic kidney disease, diabetes, cholecystectomy, hysterectomy, and bladder stimulator. EXAM: CT ABDOMEN AND PELVIS WITH CONTRAST TECHNIQUE: Multidetector CT imaging of the abdomen and pelvis was performed using the standard protocol following bolus administration of intravenous contrast. CONTRAST:  11m OMNIPAQUE IOHEXOL 350 MG/ML SOLN COMPARISON:  Included portion from PET CT 07/12/2019, abdominopelvic CT 10/16/2016 FINDINGS: Lower chest: Trace pleural thickening at the right lung base with adjacent atelectasis. No basilar pneumonia. Calcified lower paraesophageal nodes are unchanged from prior. Coronary artery calcifications are seen. Hepatobiliary: Chest tube Pancreas: No ductal dilatation or inflammation. Spleen: Normal in size without focal abnormality. Adrenals/Urinary  Tract: Normal adrenal glands. Homogeneous renal enhancement. No hydronephrosis or perinephric edema. There may be a punctate nonobstructing stone in the lower left kidney. Cortical low-density in the mid left and medial right kidney are too small to characterize but likely small cysts. Unremarkable urinary bladder. Stomach/Bowel: Decompressed stomach. No small bowel obstruction or inflammation. Normal appendix, series 5, image 34. small to moderate volume of colonic stool. No colonic wall thickening or inflammation. No significant diverticular disease. Vascular/Lymphatic: Moderate aortic atherosclerosis. No aortic aneurysm. Patent portal vein. No portal venous or mesenteric gas. No enlarged lymph nodes in the abdomen or pelvis. Reproductive: Status post hysterectomy. No adnexal masses. Other: No free air or ascites. Presacral stimulator in place with lead tip on the right. No abdominal wall hernia. Musculoskeletal: No blastic or destructive lytic lesions are seen. No acute osseous abnormalities are seen. Mild lumbar spine degenerative change. Remote right tenth rib fracture. IMPRESSION: 1. No acute abnormality or explanation for abdominal pain. Normal appendix. 2. Possible punctate nonobstructing stone in the lower left kidney. Aortic Atherosclerosis (ICD10-I70.0). Electronically Signed   By: MKeith RakeM.D.   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 Normal heart size, mediastinal contours, and pulmonary vascularity. Mild RIGHT basilar atelectasis with minimal accentuation of interstitial markings versus prior study. Lungs otherwise clear. No definite infiltrate, pleural effusion, or pneumothorax. No acute  osseous findings. IMPRESSION: Mild RIGHT basilar atelectasis. Electronically Signed   By: MLavonia DanaM.D.   On: 03/05/2021 14:02  Scheduled Meds:  azithromycin  500 mg Oral Daily   Chlorhexidine Gluconate Cloth  6 each Topical Daily   enoxaparin (LOVENOX) injection  40 mg Subcutaneous Q24H   FLUoxetine  40 mg Oral Daily   fluticasone  2 spray Each Nare Daily   pantoprazole  40 mg Oral Daily   traZODone  100 mg Oral QHS   Continuous Infusions:  ceFEPime (MAXIPIME) IV Stopped (03/06/21 4461)   metronidazole 500 mg (03/06/21 0551)   vancomycin       LOS: 1 day    Time spent: 35 mins.More than 50% of that time was spent in counseling and/or coordination of care.      Shelly Coss, MD [facility] P9/11/2020, 7:54 AM

## 2021-03-06 NOTE — TOC Initial Note (Signed)
Transition of Care [facility]) - Initial/Assessment Note    Patient Details  Name: Sarah [name] MRN: [id number] Date of Birth: 10/05/1955  [facility] CM/SW Contact:    Shelbie Hutching, RN Phone Number: 03/06/2021, 3:00 PM  Clinical Narrative:                 Patient admitted to the hospital with fever and history of multiple myeloma not in remission.  RNCM met with patient at the bedside to complete high readmission risk assessment.  Patient is from home where she lives with her sister.  Patient is independent and drives.  Patient is current with her PCP and with Dr. Tasia Catchings in oncology.   No discharge needs identified at this time.   Expected Discharge Plan: Home/Self Care Barriers to Discharge: Continued Medical Work up   Patient Goals and CMS Choice        Expected Discharge Plan and Services Expected Discharge Plan: Home/Self Care       Living arrangements for the past 2 months: Mobile Home                 DME Arranged: N/A DME Agency: NA       HH Arranged: NA [place] Agency: NA        Prior Living Arrangements/Services Living arrangements for the past 2 months: Mobile Home Lives with:: Siblings Patient language and need for interpreter reviewed:: Yes Do you feel safe going back to the place where you live?: Yes      Need for Family Participation in Patient Care: Yes (Comment) Care giver support system in place?: Yes (comment) (sister)   Criminal Activity/Legal Involvement Pertinent to Current Situation/Hospitalization: No - Comment as needed  Activities of Daily Living Home Assistive Devices/Equipment: Dentures (specify type) ADL Screening (condition at time of admission) Patient's cognitive ability adequate to safely complete daily activities?: Yes Is the patient deaf or have difficulty hearing?: No Does the patient have difficulty seeing, even when wearing glasses/contacts?: No Does the patient have difficulty concentrating, remembering, or making  decisions?: No Patient able to express need for assistance with ADLs?: No Does the patient have difficulty dressing or bathing?: No Independently performs ADLs?: Yes (appropriate for developmental age) Does the patient have difficulty walking or climbing stairs?: No Weakness of Legs: Both Weakness of Arms/Hands: Both  Permission Sought/Granted   Permission granted to share information with : No              Emotional Assessment Appearance:: Appears stated age Attitude/Demeanor/Rapport: Engaged Affect (typically observed): Accepting Orientation: : Oriented to Self, Oriented to Place, Oriented to  Time, Oriented to Situation Alcohol / Substance Use: Not Applicable Psych Involvement: No (comment)  Admission diagnosis:  Fever in adult [R50.9] Fever, unspecified fever cause [R50.9] Patient Active Problem List   Diagnosis Date Noted   Fever in adult 03/05/2021   PVC (premature ventricular contraction) 12/04/2020   Diarrhea 10/25/2020   Encounter for antineoplastic chemotherapy 08/31/2020   Coronary artery disease involving native coronary artery of native heart 04/26/2020   Yeast vaginitis 03/18/2020   Hypokalemia 03/11/2020   Dyspnea 03/02/2020   Physical deconditioning 02/07/2020   Impaired nutrition 02/07/2020   Right lower lobe pneumonia 11/13/2019   Encounter for antineoplastic immunotherapy 10/03/2019   Hypocalcemia 08/28/2019   Stage 3 chronic kidney disease ([place]) 05/12/2019   Chronic nausea 12/17/2018   Chemotherapy-induced neuropathy ([place]) 12/17/2018   Hyperlipidemia associated with type 2 diabetes mellitus ([place]) 11/25/2018   Anemia 10/21/2018  Hypomagnesemia 08/20/2018   Goals of care, counseling/discussion 08/20/2018   B12 deficiency 08/20/2018   Thrombocytopenia ([place]) 02/09/2018   Benign essential HTN 08/21/2017   Carotid artery stenosis, asymptomatic, bilateral 08/06/2017   Thyroid nodule 08/06/2017   Iron deficiency anemia due to chronic blood loss  05/13/2017   Spinal stenosis of lumbar region with neurogenic claudication 04/09/2017   Long term prescription opiate use 09/07/2016   Chronic pain syndrome 07/08/2016   Pain medication agreement signed 07/08/2016   Spondylosis of lumbar region without myelopathy or radiculopathy 07/08/2016   Severe aortic stenosis 06/05/2016   Environmental and seasonal allergies 04/19/2016   Insomnia 04/19/2016   Asthma 04/19/2016   Aortic regurgitation 04/19/2016   Type II diabetes mellitus with complication ([place]) 37/62/8315   Depression, major, single episode, in partial remission ([place]) 04/12/2016   Irritable bowel syndrome (IBS) 04/12/2016   Hepatic steatosis 04/12/2016   Multiple myeloma not having achieved remission ([place]) 04/02/2016   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[place]) 07/06/2015   Stem cell transplant candidate 05/16/2015   PCP:  Glean Hess, MD Pharmacy:   [facility] - [place], [address] [address] Phone: [phone] Fax: [phone]  [facility] - [place], [address] [place] [address] Phone: [phone] Fax: [phone]  [facility] [place] [place] [address] Phone: [phone] Fax: [phone]     Social Determinants of Health (SDOH) Interventions    Readmission Risk Interventions Readmission Risk Prevention Plan 03/06/2021 10/20/2019  Transportation Screening Complete Complete  PCP or Specialist Appt within 3-5 Days Complete Complete  HRI or Home Care Consult Complete Complete  Social Work Consult for [place] Planning/Counseling Complete -  Palliative Care Screening Not Applicable Complete  Medication Review (RN Care Manager) Referral to Pharmacy Referral to Pharmacy   Some recent data might be hidden

## 2021-03-06 NOTE — Progress Notes (Signed)
   03/06/21 1500  Assess: MEWS Score  Temp (!) 102.2 F (39 C)  BP 114/67  Pulse Rate 66  Resp 18  Level of Consciousness Alert  SpO2 96 %  O2 Device Room Air  Patient Activity (if Appropriate) In bed  Assess: MEWS Score  MEWS Temp 2  MEWS Systolic 0  MEWS Pulse 0  MEWS RR 0  MEWS LOC 0  MEWS Score 2  MEWS Score Color Yellow  Assess: if the MEWS score is Yellow or Red  Were vital signs taken at a resting state? Yes  Focused Assessment Change from prior assessment (see assessment flowsheet)  Does the patient meet 2 or more of the SIRS criteria? No  MEWS guidelines implemented *See Row Information* Yes  Treat  Pain Scale 0-10  Pain Score 0  Take Vital Signs  Increase Vital Sign Frequency  Yellow: Q 2hr X 2 then Q 4hr X 2, if remains yellow, continue Q 4hrs  Escalate  MEWS: Escalate Yellow: discuss with charge nurse/RN and consider discussing with provider and RRT  Notify: Charge Nurse/RN  Name of Charge Nurse/RN Notified Siri Cole, RN  Date Charge Nurse/RN Notified 03/06/21  Time Charge Nurse/RN Notified 1502  Notify: Provider  Provider Name/Title Dr. Tawanna Solo  Date Provider Notified 03/06/21  Time Provider Notified 1657  Notification Type Page  Notification Reason Other (Comment) (Temp, and interventions)  Provider response No new orders  Date of Provider Response 03/06/21  Time of Provider Response 1659  Document  Patient Outcome Stabilized after interventions  Progress note created (see row info) Yes  Assess: SIRS CRITERIA  SIRS Temperature  1  SIRS Pulse 0  SIRS Respirations  0  SIRS WBC 0  SIRS Score Sum  1

## 2021-03-06 NOTE — Progress Notes (Signed)
   03/06/21 0059  Assess: MEWS Score  Temp (!) 103 F (39.4 C)  BP 130/80  Pulse Rate 88  Resp 16  Level of Consciousness Alert  SpO2 95 %  O2 Device Room Air  Assess: MEWS Score  MEWS Temp 2  MEWS Systolic 0  MEWS Pulse 0  MEWS RR 0  MEWS LOC 0  MEWS Score 2  MEWS Score Color Yellow  Assess: if the MEWS score is Yellow or Red  Were vital signs taken at a resting state? Yes  Focused Assessment No change from prior assessment  Does the patient meet 2 or more of the SIRS criteria? No  MEWS guidelines implemented *See Row Information* Yes  Treat  MEWS Interventions Escalated (See documentation below);Administered prn meds/treatments  Take Vital Signs  Increase Vital Sign Frequency  Yellow: Q 2hr X 2 then Q 4hr X 2, if remains yellow, continue Q 4hrs  Escalate  MEWS: Escalate Yellow: discuss with charge nurse/RN and consider discussing with provider and RRT  Notify: Charge Nurse/RN  Name of Charge Nurse/RN Notified Kennyth Lose RN  Date Charge Nurse/RN Notified 03/06/21  Time Charge Nurse/RN Notified 0100  Notify: Provider  Provider Name/Title Sharion Settler NP  Date Provider Notified 03/06/21  Time Provider Notified 0100  Notification Type Page  Notification Reason Change in status  Provider response See new orders  Date of Provider Response 03/06/21  Time of Provider Response 0109  Document  Patient Outcome Other (Comment) (will continue to monitor)  Progress note created (see row info) Yes  Assess: SIRS CRITERIA  SIRS Temperature  1  SIRS Pulse 0  SIRS Respirations  0  SIRS WBC 0  SIRS Score Sum  1

## 2021-03-07 ENCOUNTER — Encounter: Payer: Self-pay | Admitting: Internal Medicine

## 2021-03-07 LAB — CBC WITH DIFFERENTIAL/PLATELET
Abs Immature Granulocytes: 0.04 10*3/uL (ref 0.00–0.07)
Basophils Absolute: 0 10*3/uL (ref 0.0–0.1)
Basophils Relative: 0 %
Eosinophils Absolute: 0.4 10*3/uL (ref 0.0–0.5)
Eosinophils Relative: 6 %
HCT: 30 % — ABNORMAL LOW (ref 36.0–46.0)
Hemoglobin: 10.2 g/dL — ABNORMAL LOW (ref 12.0–15.0)
Immature Granulocytes: 1 %
Lymphocytes Relative: 10 %
Lymphs Abs: 0.8 10*3/uL (ref 0.7–4.0)
MCH: 27.8 pg (ref 26.0–34.0)
MCHC: 34 g/dL (ref 30.0–36.0)
MCV: 81.7 fL (ref 80.0–100.0)
Monocytes Absolute: 0.6 10*3/uL (ref 0.1–1.0)
Monocytes Relative: 8 %
Neutro Abs: 5.7 10*3/uL (ref 1.7–7.7)
Neutrophils Relative %: 75 %
Platelets: 123 10*3/uL — ABNORMAL LOW (ref 150–400)
RBC: 3.67 MIL/uL — ABNORMAL LOW (ref 3.87–5.11)
RDW: 13.1 % (ref 11.5–15.5)
WBC: 7.5 10*3/uL (ref 4.0–10.5)
nRBC: 0 % (ref 0.0–0.2)

## 2021-03-07 LAB — BASIC METABOLIC PANEL
Anion gap: 10 (ref 5–15)
BUN: 19 mg/dL (ref 8–23)
CO2: 21 mmol/L — ABNORMAL LOW (ref 22–32)
Calcium: 8.7 mg/dL — ABNORMAL LOW (ref 8.9–10.3)
Chloride: 105 mmol/L (ref 98–111)
Creatinine, Ser: 0.98 mg/dL (ref 0.44–1.00)
GFR, Estimated: >60 mL/min (ref >60)
Glucose, Bld: 150 mg/dL — ABNORMAL HIGH (ref 70–99)
Potassium: 4.4 mmol/L (ref 3.5–5.1)
Sodium: 136 mmol/L (ref 135–145)

## 2021-03-07 LAB — MAGNESIUM: Magnesium: 1.8 mg/dL (ref 1.7–2.4)

## 2021-03-07 MED ORDER — AMOXICILLIN 500 MG PO CAPS
500.0000 mg | ORAL_CAPSULE | Freq: Three times a day (TID) | ORAL | Status: DC
  Administered 2021-03-07 – 2021-03-09 (×6): 500 mg via ORAL
  Filled 2021-03-07 (×8): qty 1

## 2021-03-07 NOTE — Progress Notes (Signed)
PROGRESS NOTE    Sarah [name]  SEG:[id number] DOB: Sep 05, 1955 DOA: 03/05/2021 PCP: Glean Hess, MD   Chief Complain: Fever  Brief Narrative: Patient is a 65 year old female with history of multiple myeloma, chronic kidney disease stage IIIa, type 2 diabetes, chronic diastolic congestive heart failure who presented here with complaints of high-grade fever.  She has IV port for chemotherapy, last chemo was about 10 days ago, started having fever 5 days ago with chills.  Also complained of shortness of breath, mild cough which was nonproductive.  Patient also reported intermittent lower abdominal pain without diarrhea or constipation.  On presentation she was febrile at 101.3 Fahrenheit, blood pressure was stable.  Chest x-ray showed mild right lower lobe infiltrate.  Currently on cefepime  Assessment & Plan:   Active Problems:   Multiple myeloma not having achieved remission (HCC)   Right lower lobe pneumonia   Fever in adult   Fever /possible right lower lobe pneumonia: Presented with fever, shortness of breath, cough.  Chest x-ray showed  right lower lobe atelectasis vs pneumonia.   She is  still febrile this morning.  We will follow-up blood cultures. UA was not that impressive for UTI.  Patient denies any dysuria.  Urine culture is showing 60,000 colonies of Enterococcus faecalis.  We will follow-up final culture report.  She is currently on cefepime.  Vancomycin discontinued due to negative MRSA PCR. Started on amoxicillin for Enterococcus faecalis  Abdominal pain: CT abdomen/pelvis did not show any intra-abdominal findings.  Multiple myeloma: Follows with oncology as an outpatient.  Has a Chemo-Port.  Last chemo about 10 days ago from admission date.  Chronic congestive heart failure: No evidence of volume overload.  Last echo done in 5/21 showed EF of 60 to 65%.  CKD stage IIIa: Currently kidney function stable  Mild thrombocytopenia: Most likely secondary to  chemotherapy/multiple myeloma.  Continue to monitor  Hypomagnesemia: Supplemented and corrected  Debility deconditioning/weakness: We will request PT evaluation         DVT prophylaxis:Lovenox Code Status: Full Family Communication: None at bedside Status is: Inpatient  Remains inpatient appropriate because:Inpatient level of care appropriate due to severity of illness  Dispo: The patient is from: Home              Anticipated d/c is to: Home              Patient currently is medically stable to d/c.   Difficult to place patient No    Consultants: None  Procedures:None  Antimicrobials:  Anti-infectives (From admission, onward)    Start     Dose/Rate Route Frequency Ordered Stop   03/06/21 2000  vancomycin (VANCOREADY) IVPB 750 mg/150 mL  Status:  Discontinued        750 mg 150 mL/hr over 60 Minutes Intravenous Every 24 hours 03/05/21 1920 03/06/21 1412   03/06/21 1515  acyclovir (ZOVIRAX) 200 MG capsule 400 mg        400 mg Oral 2 times daily 03/06/21 1429     03/06/21 1000  azithromycin (ZITHROMAX) tablet 500 mg        500 mg Oral Daily 03/05/21 1711     03/05/21 1830  vancomycin (VANCOREADY) IVPB 1500 mg/300 mL        1,500 mg 150 mL/hr over 120 Minutes Intravenous  Once 03/05/21 1709 03/05/21 2154   03/05/21 1800  metroNIDAZOLE (FLAGYL) IVPB 500 mg  Status:  Discontinued        500 mg 100 mL/hr  over 60 Minutes Intravenous Every 12 hours 03/05/21 1702 03/06/21 0802   03/05/21 1800  ceFEPIme (MAXIPIME) 2 g in sodium chloride 0.9 % 100 mL IVPB        2 g 200 mL/hr over 30 Minutes Intravenous Every 12 hours 03/05/21 1706     03/05/21 1715  ceFEPIme (MAXIPIME) 2 g in sodium chloride 0.9 % 100 mL IVPB  Status:  Discontinued        2 g 200 mL/hr over 30 Minutes Intravenous  Once 03/05/21 1702 03/05/21 1706   03/05/21 1715  vancomycin (VANCOCIN) IVPB 1000 mg/200 mL premix  Status:  Discontinued        1,000 mg 200 mL/hr over 60 Minutes Intravenous  Once 03/05/21 1702  03/05/21 1709   03/05/21 1545  cefTRIAXone (ROCEPHIN) 1 g in sodium chloride 0.9 % 100 mL IVPB        1 g 200 mL/hr over 30 Minutes Intravenous  Once 03/05/21 1532 03/05/21 1615   03/05/21 1545  azithromycin (ZITHROMAX) tablet 500 mg        500 mg Oral  Once 03/05/21 1532 03/05/21 1541       Subjective: Patient seen and examined at the bedside this morning.  When I saw her, she was overall comfortable.  She complains of generalized weakness.  She had fever earlier this morning.   Objective: Vitals:   03/06/21 1958 03/06/21 2328 03/07/21 0457 03/07/21 0610  BP: 118/65 (!) 119/58 (!) 146/65   Pulse: 67 68 78   Resp: 15 16 16    Temp: 98.7 F (37.1 C) 98.7 F (37.1 C) 100 F (37.8 C) 100.2 F (37.9 C)  TempSrc:  Oral  Oral  SpO2: 96% 95% 98%   Weight:      Height:        Intake/Output Summary (Last 24 hours) at 03/07/2021 0810 Last data filed at 03/07/2021 0600 Gross per 24 hour  Intake 865.45 ml  Output 200 ml  Net 665.45 ml   Filed Weights   03/05/21 1303 03/05/21 1946  Weight: 69.4 kg 71.3 kg    Examination:  General exam: Overall comfortable, not in distress HEENT: PERRL Respiratory system:  no wheezes or crackles  Cardiovascular system: S1 & S2 heard, RRR.  Chemo-Port on the right chest Gastrointestinal system: Abdomen is nondistended, soft and nontender. Central nervous system: Alert and oriented Extremities: No edema, no clubbing ,no cyanosis Skin: No rashes, no ulcers,no icterus      Data Reviewed: I have personally reviewed following labs and imaging studies  CBC: Recent Labs  Lab 03/05/21 1357 03/06/21 0530 03/07/21 0518  WBC 7.6  PENDING 8.9 7.5  NEUTROABS 6.0  DUPLICATE REQUEST  --  5.7  HGB 10.3*  PENDING 10.2* 10.2*  HCT 30.6*  PENDING 29.8* 30.0*  MCV 83.8  PENDING 82.8 81.7  PLT 130*  PENDING 132* 195*   Basic Metabolic Panel: Recent Labs  Lab 03/05/21 1357 03/06/21 0530 03/07/21 0518  NA 137 136 136  K 4.4 4.1 4.4  CL 103  104 105  CO2 25 24 21*  GLUCOSE 174* 149* 150*  BUN 26* 25* 19  CREATININE 1.26* 1.15* 0.98  CALCIUM 9.1 8.3* 8.7*  MG  --  1.3* 1.8  PHOS  --  3.6  --    GFR: Estimated Creatinine Clearance: 54.9 mL/min (by C-G formula based on SCr of 0.98 mg/dL). Liver Function Tests: Recent Labs  Lab 03/05/21 1357  AST 25  ALT 24  ALKPHOS 111  BILITOT 0.5  PROT 6.6  ALBUMIN 3.5   Recent Labs  Lab 03/05/21 1357  LIPASE 28   No results for input(s): AMMONIA in the last 168 hours. Coagulation Profile: Recent Labs  Lab 03/06/21 0530  INR 1.1   Cardiac Enzymes: No results for input(s): CKTOTAL, CKMB, CKMBINDEX, TROPONINI in the last 168 hours. BNP (last 3 results) No results for input(s): PROBNP in the last 8760 hours. HbA1C: No results for input(s): HGBA1C in the last 72 hours. CBG: No results for input(s): GLUCAP in the last 168 hours. Lipid Profile: No results for input(s): CHOL, HDL, LDLCALC, TRIG, CHOLHDL, LDLDIRECT in the last 72 hours. Thyroid Function Tests: No results for input(s): TSH, T4TOTAL, FREET4, T3FREE, THYROIDAB in the last 72 hours. Anemia Panel: No results for input(s): VITAMINB12, FOLATE, FERRITIN, TIBC, IRON, RETICCTPCT in the last 72 hours. Sepsis Labs: Recent Labs  Lab 03/05/21 1357 03/06/21 0530  PROCALCITON <0.10 <0.10  LATICACIDVEN 1.5  --     Recent Results (from the past 240 hour(s))  Blood culture (routine x 2)     Status: None (Preliminary result)   Collection Time: 03/05/21  1:58 PM   Specimen: BLOOD  Result Value Ref Range Status   Specimen Description BLOOD BLOOD RIGHT FOREARM  Final   Special Requests   Final    BOTTLES DRAWN AEROBIC AND ANAEROBIC Blood Culture adequate volume   Culture   Final    NO GROWTH 2 DAYS Performed at [facility], [address] [place], [place] [address]    Report Status PENDING  Incomplete  Resp Panel by RT-PCR (Flu A&B, Covid) Nasopharyngeal Swab     Status: None   Collection Time: 03/05/21   1:58 PM   Specimen: Nasopharyngeal Swab; Nasopharyngeal(NP) swabs in vial transport medium  Result Value Ref Range Status   SARS Coronavirus 2 by RT PCR NEGATIVE NEGATIVE Final    Comment: (NOTE) SARS-CoV-2 target nucleic acids are NOT DETECTED.  The SARS-CoV-2 RNA is generally detectable in upper respiratory specimens during the acute phase of infection. The lowest concentration of SARS-CoV-2 viral copies this assay can detect is 138 copies/mL. A negative result does not preclude SARS-Cov-2 infection and should not be used as the sole basis for treatment or other patient management decisions. A negative result may occur with  improper specimen collection/handling, submission of specimen other than nasopharyngeal swab, presence of viral mutation(s) within the areas targeted by this assay, and inadequate number of viral copies(<138 copies/mL). A negative result must be combined with clinical observations, patient history, and epidemiological information. The expected result is Negative.  Fact Sheet for Patients:  EntrepreneurPulse.com.au  Fact Sheet for Healthcare Providers:  IncredibleEmployment.be  This test is no t yet approved or cleared by the [place] FDA and  has been authorized for detection and/or diagnosis of SARS-CoV-2 by FDA under an Emergency Use Authorization (EUA). This EUA will remain  in effect (meaning this test can be used) for the duration of the COVID-19 declaration under Section 564(b)(1) of the Act, 21 U.S.C.section 360bbb-3(b)(1), unless the authorization is terminated  or revoked sooner.       Influenza A by PCR NEGATIVE NEGATIVE Final   Influenza B by PCR NEGATIVE NEGATIVE Final    Comment: (NOTE) The Xpert Xpress SARS-CoV-2/FLU/RSV plus assay is intended as an aid in the diagnosis of influenza from Nasopharyngeal swab specimens and should not be used as a sole basis for treatment. Nasal washings and aspirates  are unacceptable for Xpert Xpress SARS-CoV-2/FLU/RSV  testing.  Fact Sheet for Patients: EntrepreneurPulse.com.au  Fact Sheet for Healthcare Providers: IncredibleEmployment.be  This test is not yet approved or cleared by the [place] FDA and has been authorized for detection and/or diagnosis of SARS-CoV-2 by FDA under an Emergency Use Authorization (EUA). This EUA will remain in effect (meaning this test can be used) for the duration of the COVID-19 declaration under Section 564(b)(1) of the Act, 21 U.S.C. section 360bbb-3(b)(1), unless the authorization is terminated or revoked.  Performed at [facility], [address] [place], [place] [address]   Blood culture (routine x 2)     Status: None (Preliminary result)   Collection Time: 03/05/21  2:07 PM   Specimen: BLOOD  Result Value Ref Range Status   Specimen Description BLOOD PORTA CATH  Final   Special Requests   Final    BOTTLES DRAWN AEROBIC AND ANAEROBIC Blood Culture adequate volume   Culture   Final    NO GROWTH 2 DAYS Performed at [facility], [address], [place], [place] [address]    Report Status PENDING  Incomplete  Urine Culture     Status: None (Preliminary result)   Collection Time: 03/05/21  8:20 PM   Specimen: Urine, Clean Catch  Result Value Ref Range Status   Specimen Description   Final    URINE, CLEAN CATCH Performed at [facility], [address], [place], [place] [address]    Special Requests   Final    NONE Performed at [facility], [address], [place], [place] [address]    Culture   Final    CULTURE REINCUBATED FOR BETTER GROWTH Performed at [facility], [place] [address] [place], [place] [address]    Report Status PENDING  Incomplete  MRSA Next Gen by PCR, Nasal     Status: None   Collection Time: 03/06/21 10:08 AM   Specimen: Nasal Mucosa; Nasal Swab  Result Value Ref Range Status   MRSA by  PCR Next Gen NOT DETECTED NOT DETECTED Final    Comment: (NOTE) The GeneXpert MRSA Assay (FDA approved for NASAL specimens only), is one component of a comprehensive MRSA colonization surveillance program. It is not intended to diagnose MRSA infection nor to guide or monitor treatment for MRSA infections. Test performance is not FDA approved in patients less than 90 years old. Performed at [facility], [address] [place],  [address]          Radiology Studies: CT ABDOMEN PELVIS W CONTRAST  Result Date: 03/05/2021 CLINICAL DATA:  Provided history of: Appendicitis suspected, intermediate risk, no prior imaging (Ped 0-18y) Technologist notes state: Fever. Intermittent lower abdominal pain. History of multiple myeloma, chronic kidney disease, diabetes, cholecystectomy, hysterectomy, and bladder stimulator. EXAM: CT ABDOMEN AND PELVIS WITH CONTRAST TECHNIQUE: Multidetector CT imaging of the abdomen and pelvis was performed using the standard protocol following bolus administration of intravenous contrast. CONTRAST:  37m OMNIPAQUE IOHEXOL 350 MG/ML SOLN COMPARISON:  Included portion from PET CT 07/12/2019, abdominopelvic CT 10/16/2016 FINDINGS: Lower chest: Trace pleural thickening at the right lung base with adjacent atelectasis. No basilar pneumonia. Calcified lower paraesophageal nodes are unchanged from prior. Coronary artery calcifications are seen. Hepatobiliary: Chest tube Pancreas: No ductal dilatation or inflammation. Spleen: Normal in size without focal abnormality. Adrenals/Urinary Tract: Normal adrenal glands. Homogeneous renal enhancement. No hydronephrosis or perinephric edema. There may be a punctate nonobstructing stone in the lower left kidney. Cortical low-density in the mid left and medial  right kidney are too small to characterize but likely small cysts. Unremarkable urinary bladder. Stomach/Bowel: Decompressed stomach. No small bowel obstruction or  inflammation. Normal appendix, series 5, image 34. small to moderate volume of colonic stool. No colonic wall thickening or inflammation. No significant diverticular disease. Vascular/Lymphatic: Moderate aortic atherosclerosis. No aortic aneurysm. Patent portal vein. No portal venous or mesenteric gas. No enlarged lymph nodes in the abdomen or pelvis. Reproductive: Status post hysterectomy. No adnexal masses. Other: No free air or ascites. Presacral stimulator in place with lead tip on the right. No abdominal wall hernia. Musculoskeletal: No blastic or destructive lytic lesions are seen. No acute osseous abnormalities are seen. Mild lumbar spine degenerative change. Remote right tenth rib fracture. IMPRESSION: 1. No acute abnormality or explanation for abdominal pain. Normal appendix. 2. Possible punctate nonobstructing stone in the lower left kidney. Aortic Atherosclerosis (ICD10-I70.0). Electronically Signed   By: Keith Rake M.D.   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 Normal heart size, mediastinal contours, and pulmonary vascularity. Mild RIGHT basilar atelectasis with minimal accentuation of interstitial markings versus prior study. Lungs otherwise clear. No definite infiltrate, pleural effusion, or pneumothorax. No acute osseous findings. IMPRESSION: Mild RIGHT basilar atelectasis. Electronically Signed   By: Lavonia Dana M.D.   On: 03/05/2021 14:02        Scheduled Meds:  acyclovir  400 mg Oral BID   azithromycin  500 mg Oral Daily   Chlorhexidine Gluconate Cloth  6 each Topical Daily   enoxaparin (LOVENOX) injection  40 mg Subcutaneous Q24H   FLUoxetine  40 mg Oral Daily   fluticasone  2  spray Each Nare Daily   pantoprazole  40 mg Oral Daily   traZODone  100 mg Oral QHS   Continuous Infusions:  ceFEPime (MAXIPIME) IV Stopped (03/07/21 0600)     LOS: 2 days    Time spent: 35 mins.More than 50% of that time was spent in counseling and/or coordination of care.      Shelly Coss, MD [facility] P9/12/2020, 8:10 AM

## 2021-03-07 NOTE — Plan of Care (Signed)
  Problem: Education: Goal: Knowledge of General Education information will improve Description: Including pain rating scale, medication(s)/side effects and non-pharmacologic comfort measures Outcome: Progressing   Problem: Clinical Measurements: Goal: Will remain free from infection Outcome: Progressing Goal: Diagnostic test results will improve Outcome: Progressing   Problem: Nutrition: Goal: Adequate nutrition will be maintained Outcome: Progressing   Problem: Elimination: Goal: Will not experience complications related to bowel motility Outcome: Progressing   Problem: Pain Managment: Goal: General experience of comfort will improve Outcome: Progressing

## 2021-03-08 ENCOUNTER — Ambulatory Visit: Payer: Medicare HMO

## 2021-03-08 ENCOUNTER — Inpatient Hospital Stay: Payer: Medicare HMO

## 2021-03-08 LAB — URINE CULTURE: Culture: 60000 — AB

## 2021-03-08 NOTE — Progress Notes (Signed)
PROGRESS NOTE    Sarah [name]  KJZ:[id number] DOB: 06/16/1956 DOA: 03/05/2021 PCP: Glean Hess, MD   Chief Complain: Fever  Brief Narrative: Patient is a 65 year old female with history of multiple myeloma, chronic kidney disease stage IIIa, type 2 diabetes, chronic diastolic congestive heart failure who presented here with complaints of high-grade fever.  She has IV port for chemotherapy, last chemo was about 10 days ago, started having fever 5 days ago with chills.  Also complained of shortness of breath, mild cough which was nonproductive.  Patient also reported intermittent lower abdominal pain without diarrhea or constipation.  On presentation she was febrile at 101.3 Fahrenheit, blood pressure was stable.  Chest x-ray suspicious for  mild right lower lobe infiltrate.  Currently on cefepime.  Blood cultures have not shown any growth.  Plan for discharge to home tomorrow if blood cultures remain negative  Assessment & Plan:   Active Problems:   Multiple myeloma not having achieved remission (HCC)   Right lower lobe pneumonia   Fever in adult   Fever /possible right lower lobe pneumonia: Presented with fever, shortness of breath, cough.  Chest x-ray showed  right lower lobe atelectasis vs pneumonia.   She is   afebrile since last 24 hrs. blood cultures no growth till date. UA was not that impressive for UTI.  Patient denies any dysuria.  Urine culture is showing 60,000 colonies of Enterococcus faecalis. She is currently on cefepime.  Vancomycin discontinued due to negative MRSA PCR. Started on amoxicillin for Enterococcus faecalis, will plan for 5 days course  Abdominal pain/right hip pain: CT abdomen/pelvis did not show any intra-abdominal findings.  CT abdomen/pelvis did not show any osseous lesions.  We are checking x-ray of the right hip today  Multiple myeloma: Follows with oncology as an outpatient.  Has a Chemo-Port.  Last chemo about 10 days ago from admission  date.  Chronic congestive heart failure: No evidence of volume overload.  Last echo done in 5/21 showed EF of 60 to 65%.  CKD stage IIIa: Currently kidney function stable  Mild thrombocytopenia: Most likely secondary to chemotherapy/multiple myeloma.  Continue to monitor  Hypomagnesemia: Supplemented and corrected  Debility deconditioning/weakness: We will request PT evaluation         DVT prophylaxis:Lovenox Code Status: Full Family Communication: None at bedside Status is: Inpatient  Remains inpatient appropriate because:Inpatient level of care appropriate due to severity of illness  Dispo: The patient is from: Home              Anticipated d/c is to: Home tomorrow              Patient currently is medically stable to d/c.   Difficult to place patient No    Consultants: None  Procedures:None  Antimicrobials:  Anti-infectives (From admission, onward)    Start     Dose/Rate Route Frequency Ordered Stop   03/07/21 1400  amoxicillin (AMOXIL) capsule 500 mg        500 mg Oral Every 8 hours 03/07/21 1147 03/12/21 1359   03/06/21 2000  vancomycin (VANCOREADY) IVPB 750 mg/150 mL  Status:  Discontinued        750 mg 150 mL/hr over 60 Minutes Intravenous Every 24 hours 03/05/21 1920 03/06/21 1412   03/06/21 1515  acyclovir (ZOVIRAX) 200 MG capsule 400 mg        400 mg Oral 2 times daily 03/06/21 1429     03/06/21 1000  azithromycin (ZITHROMAX) tablet 500 mg  Status:  Discontinued        500 mg Oral Daily 03/05/21 1711 03/07/21 1147   03/05/21 1830  vancomycin (VANCOREADY) IVPB 1500 mg/300 mL        1,500 mg 150 mL/hr over 120 Minutes Intravenous  Once 03/05/21 1709 03/05/21 2154   03/05/21 1800  metroNIDAZOLE (FLAGYL) IVPB 500 mg  Status:  Discontinued        500 mg 100 mL/hr over 60 Minutes Intravenous Every 12 hours 03/05/21 1702 03/06/21 0802   03/05/21 1800  ceFEPIme (MAXIPIME) 2 g in sodium chloride 0.9 % 100 mL IVPB        2 g 200 mL/hr over 30 Minutes  Intravenous Every 12 hours 03/05/21 1706     03/05/21 1715  ceFEPIme (MAXIPIME) 2 g in sodium chloride 0.9 % 100 mL IVPB  Status:  Discontinued        2 g 200 mL/hr over 30 Minutes Intravenous  Once 03/05/21 1702 03/05/21 1706   03/05/21 1715  vancomycin (VANCOCIN) IVPB 1000 mg/200 mL premix  Status:  Discontinued        1,000 mg 200 mL/hr over 60 Minutes Intravenous  Once 03/05/21 1702 03/05/21 1709   03/05/21 1545  cefTRIAXone (ROCEPHIN) 1 g in sodium chloride 0.9 % 100 mL IVPB        1 g 200 mL/hr over 30 Minutes Intravenous  Once 03/05/21 1532 03/05/21 1615   03/05/21 1545  azithromycin (ZITHROMAX) tablet 500 mg        500 mg Oral  Once 03/05/21 1532 03/05/21 1541       Subjective: Patient seen and examined the bedside this morning.  Overall comfortable, hemodynamically stable.  She has been afebrile since yesterday morning.  Complains of right hip pain today.   Objective: Vitals:   03/07/21 1714 03/07/21 2013 03/08/21 0019 03/08/21 0410  BP: (!) 145/97 130/70 125/65 (!) 143/127  Pulse: 80 77 60 70  Resp: _0 Temp: 99.4 F (37.4 C) 99.1 F (37.3 C) 98 F (36.7 C) 98 F (36.7 C)  TempSrc:      SpO2: 97% 95% 98% 94%  Weight:      Height:        Intake/Output Summary (Last 24 hours) at 03/08/2021 0757 Last data filed at 03/07/2021 1858 Gross per 24 hour  Intake 100 ml  Output --  Net 100 ml   Filed Weights   03/05/21 1303 03/05/21 1946  Weight: 69.4 kg 71.3 kg    Examination:  General exam: Overall comfortable, not in distress HEENT: PERRL Respiratory system:  no wheezes or crackles  Cardiovascular system: S1 & S2 heard, RRR.  Chemo-Port on the right chest Gastrointestinal system: Abdomen is nondistended, soft .  Mild tenderness on the left lower quadrant Central nervous system: Alert and oriented Extremities: No edema, no clubbing ,no cyanosis Skin: No rashes, no ulcers,no icterus      Data Reviewed: I have personally reviewed following labs and  imaging studies  CBC: Recent Labs  Lab 03/05/21 1357 03/06/21 0530 03/07/21 0518  WBC 7.6  PENDING 8.9 7.5  NEUTROABS 6.0  DUPLICATE REQUEST  --  5.7  HGB 10.3*  PENDING 10.2* 10.2*  HCT 30.6*  PENDING 29.8* 30.0*  MCV 83.8  PENDING 82.8 81.7  PLT 130*  PENDING 132* 440*   Basic Metabolic Panel: Recent Labs  Lab 03/05/21 1357 03/06/21 0530 03/07/21 0518  NA 137 136 136  K 4.4 4.1 4.4  CL 103 104  105  CO2 25 24 21*  GLUCOSE 174* 149* 150*  BUN 26* 25* 19  CREATININE 1.26* 1.15* 0.98  CALCIUM 9.1 8.3* 8.7*  MG  --  1.3* 1.8  PHOS  --  3.6  --    GFR: Estimated Creatinine Clearance: 54.9 mL/min (by C-G formula based on SCr of 0.98 mg/dL). Liver Function Tests: Recent Labs  Lab 03/05/21 1357  AST 25  ALT 24  ALKPHOS 111  BILITOT 0.5  PROT 6.6  ALBUMIN 3.5   Recent Labs  Lab 03/05/21 1357  LIPASE 28   No results for input(s): AMMONIA in the last 168 hours. Coagulation Profile: Recent Labs  Lab 03/06/21 0530  INR 1.1   Cardiac Enzymes: No results for input(s): CKTOTAL, CKMB, CKMBINDEX, TROPONINI in the last 168 hours. BNP (last 3 results) No results for input(s): PROBNP in the last 8760 hours. HbA1C: No results for input(s): HGBA1C in the last 72 hours. CBG: No results for input(s): GLUCAP in the last 168 hours. Lipid Profile: No results for input(s): CHOL, HDL, LDLCALC, TRIG, CHOLHDL, LDLDIRECT in the last 72 hours. Thyroid Function Tests: No results for input(s): TSH, T4TOTAL, FREET4, T3FREE, THYROIDAB in the last 72 hours. Anemia Panel: No results for input(s): VITAMINB12, FOLATE, FERRITIN, TIBC, IRON, RETICCTPCT in the last 72 hours. Sepsis Labs: Recent Labs  Lab 03/05/21 1357 03/06/21 0530  PROCALCITON <0.10 <0.10  LATICACIDVEN 1.5  --     Recent Results (from the past 240 hour(s))  Blood culture (routine x 2)     Status: None (Preliminary result)   Collection Time: 03/05/21  1:58 PM   Specimen: BLOOD  Result Value Ref Range  Status   Specimen Description BLOOD BLOOD RIGHT FOREARM  Final   Special Requests   Final    BOTTLES DRAWN AEROBIC AND ANAEROBIC Blood Culture adequate volume   Culture   Final    NO GROWTH 3 DAYS Performed at [facility], [address] [place], [place] [address]    Report Status PENDING  Incomplete  Resp Panel by RT-PCR (Flu A&B, Covid) Nasopharyngeal Swab     Status: None   Collection Time: 03/05/21  1:58 PM   Specimen: Nasopharyngeal Swab; Nasopharyngeal(NP) swabs in vial transport medium  Result Value Ref Range Status   SARS Coronavirus 2 by RT PCR NEGATIVE NEGATIVE Final    Comment: (NOTE) SARS-CoV-2 target nucleic acids are NOT DETECTED.  The SARS-CoV-2 RNA is generally detectable in upper respiratory specimens during the acute phase of infection. The lowest concentration of SARS-CoV-2 viral copies this assay can detect is 138 copies/mL. A negative result does not preclude SARS-Cov-2 infection and should not be used as the sole basis for treatment or other patient management decisions. A negative result may occur with  improper specimen collection/handling, submission of specimen other than nasopharyngeal swab, presence of viral mutation(s) within the areas targeted by this assay, and inadequate number of viral copies(<138 copies/mL). A negative result must be combined with clinical observations, patient history, and epidemiological information. The expected result is Negative.  Fact Sheet for Patients:  EntrepreneurPulse.com.au  Fact Sheet for Healthcare Providers:  IncredibleEmployment.be  This test is no t yet approved or cleared by the [place] FDA and  has been authorized for detection and/or diagnosis of SARS-CoV-2 by FDA under an Emergency Use Authorization (EUA). This EUA will remain  in effect (meaning this test can be used) for the duration of the COVID-19 declaration under Section 564(b)(1) of the Act,  21 U.S.C.section  360bbb-3(b)(1), unless the authorization is terminated  or revoked sooner.       Influenza A by PCR NEGATIVE NEGATIVE Final   Influenza B by PCR NEGATIVE NEGATIVE Final    Comment: (NOTE) The Xpert Xpress SARS-CoV-2/FLU/RSV plus assay is intended as an aid in the diagnosis of influenza from Nasopharyngeal swab specimens and should not be used as a sole basis for treatment. Nasal washings and aspirates are unacceptable for Xpert Xpress SARS-CoV-2/FLU/RSV testing.  Fact Sheet for Patients: EntrepreneurPulse.com.au  Fact Sheet for Healthcare Providers: IncredibleEmployment.be  This test is not yet approved or cleared by the [place] FDA and has been authorized for detection and/or diagnosis of SARS-CoV-2 by FDA under an Emergency Use Authorization (EUA). This EUA will remain in effect (meaning this test can be used) for the duration of the COVID-19 declaration under Section 564(b)(1) of the Act, 21 U.S.C. section 360bbb-3(b)(1), unless the authorization is terminated or revoked.  Performed at [place] [facility], [address] [place], [place] [address]   Blood culture (routine x 2)     Status: None (Preliminary result)   Collection Time: 03/05/21  2:07 PM   Specimen: BLOOD  Result Value Ref Range Status   Specimen Description BLOOD PORTA CATH  Final   Special Requests   Final    BOTTLES DRAWN AEROBIC AND ANAEROBIC Blood Culture adequate volume   Culture   Final    NO GROWTH 3 DAYS Performed at [facility], [address], [place], [place] [address]    Report Status PENDING  Incomplete  Urine Culture     Status: Abnormal (Preliminary result)   Collection Time: 03/05/21  8:20 PM   Specimen: Urine, Clean Catch  Result Value Ref Range Status   Specimen Description   Final    URINE, CLEAN CATCH Performed at [facility], [address] [place], [place] [address]    Special Requests   Final     NONE Performed at [facility], [address], [place], [place] [address]    Culture (A)  Final    60,000 COLONIES/mL ENTEROCOCCUS FAECALIS SUSCEPTIBILITIES TO FOLLOW Performed at [facility], [place] [address] [place], [place] [address]    Report Status PENDING  Incomplete  MRSA Next Gen by PCR, Nasal     Status: None   Collection Time: 03/06/21 10:08 AM   Specimen: Nasal Mucosa; Nasal Swab  Result Value Ref Range Status   MRSA by PCR Next Gen NOT DETECTED NOT DETECTED Final    Comment: (NOTE) The GeneXpert MRSA Assay (FDA approved for NASAL specimens only), is one component of a comprehensive MRSA colonization surveillance program. It is not intended to diagnose MRSA infection nor to guide or monitor treatment for MRSA infections. Test performance is not FDA approved in patients less than 7 years old. Performed at [facility], [address], [place], [place] [address]          Radiology Studies: No results found.      Scheduled Meds:  acyclovir  400 mg Oral BID   amoxicillin  500 mg Oral Q8H   Chlorhexidine Gluconate Cloth  6 each Topical Daily   enoxaparin (LOVENOX) injection  40 mg Subcutaneous Q24H   FLUoxetine  40 mg Oral Daily   fluticasone  2 spray Each Nare Daily   pantoprazole  40 mg Oral Daily   traZODone  100 mg Oral QHS   Continuous Infusions:  ceFEPime (MAXIPIME) IV 2 g (03/08/21 0606)  LOS: 3 days    Time spent: 35 mins.More than 50% of that time was spent in counseling and/or coordination of care.      Shelly Coss, MD [facility] P9/01/2021, 7:57 AM

## 2021-03-08 NOTE — Evaluation (Signed)
Physical Therapy Evaluation Patient Details Name: Sarah [name] MRN: [id number] DOB: [birth year] Today's Date: 03/08/2021   History of Present Illness  Patient is a 65 year old female with history of multiple myeloma, chronic kidney disease stage IIIa, type 2 diabetes, chronic diastolic congestive heart failure who presented here with complaints of high-grade fever.   Clinical Impression  Pt admitted with above diagnosis. Pt received supine in bed agreeable to PT services. Pt reports being indep with all ADL's and IADL's prior to hospitalization. Lives with family and has adequate family assistance 24/7 if needed. Pt mod-I with bed mobility to sit EOB. HR in 70's and SPO2 at 94% on RA. Pt able to amb 180-190' with supervision. Becomes slightly SOB towards end of walking bout but pt able to get back to room and sit EoB with no difficulty. SPO2 94% and HR in mid 80's. Pt educated on energy conservation techniques and benefits of HH PT to improve strength and endurance. Pt declining need for [facility] services at this time. Pt appears to be at baseline function and does not have any acute PT needs. Pt to sign off on current orders. Pt safe to return home with family support as needed. Pt currently with functional limitations due to the deficits listed below (see PT Problem List). Pt will benefit from skilled PT to increase their independence and safety with mobility to allow discharge to the venue listed below.      Follow Up Recommendations Home health PT (due to low endurance. Pt declining however.)    Equipment Recommendations  None recommended by PT    Recommendations for Other Services       Precautions / Restrictions Precautions Precautions: None Restrictions Weight Bearing Restrictions: No      Mobility  Bed Mobility                 Patient Response: Cooperative  Transfers Overall transfer level: Needs assistance Equipment used: None Transfers: Sit to/from Stand Sit to Stand:  Supervision            Ambulation/Gait Ambulation/Gait assistance: Supervision Gait Distance (Feet): 190 Feet Assistive device: None Gait Pattern/deviations: WFL(Within Functional Limits)     General Gait Details: Becomes SOB during ambulation while holding conversation. SPO2 remains >90%.  Stairs            Wheelchair Mobility    Modified Rankin (Stroke Patients Only)       Balance Overall balance assessment: Needs assistance Sitting-balance support: Feet supported;No upper extremity supported Sitting balance-Leahy Scale: Normal       Standing balance-Leahy Scale: Normal                               Pertinent Vitals/Pain Pain Assessment: 0-10 Pain Score: 5  Pain Location: chronic LBP. Not relevant to current hospitalization. Pain Intervention(s): Monitored during session    Home Living Family/patient expects to be discharged to:: Private residence Living Arrangements: Children;Other relatives Available Help at Discharge: Family;Available 24 hours/day Type of Home: Mobile home Home Access: Stairs to enter Entrance Stairs-Rails: None Entrance Stairs-Number of Steps: 3 Home Layout: One level        Prior Function Level of Independence: Independent         Comments: Reports has RW and SPC but rarely uses unless having a "bad day".     Hand Dominance        Extremity/Trunk Assessment   Upper Extremity Assessment Upper Extremity Assessment: Generalized  weakness    Lower Extremity Assessment Lower Extremity Assessment: Overall WFL for tasks assessed    Cervical / Trunk Assessment Cervical / Trunk Assessment: Normal  Communication   Communication: No difficulties  Cognition Arousal/Alertness: Awake/alert Behavior During Therapy: WFL for tasks assessed/performed Overall Cognitive Status: Within Functional Limits for tasks assessed                                        General Comments      Exercises  Other Exercises Other Exercises: Role of PT in acute setting. Energy conservation techniques and PLB.   Assessment/Plan    PT Assessment Patent does not need any further PT services  PT Problem List         PT Treatment Interventions      PT Goals (Current goals can be found in the Care Plan section)  Acute Rehab PT Goals Patient Stated Goal: go home PT Goal Formulation: With patient Time For Goal Achievement: 03/15/21 Potential to Achieve Goals: Good    Frequency     Barriers to discharge        Co-evaluation               AM-PAC PT "6 Clicks" Mobility  Outcome Measure Help needed turning from your back to your side while in a flat bed without using bedrails?: None Help needed moving from lying on your back to sitting on the side of a flat bed without using bedrails?: None Help needed moving to and from a bed to a chair (including a wheelchair)?: None Help needed standing up from a chair using your arms (e.g., wheelchair or bedside chair)?: None Help needed to walk in hospital room?: None Help needed climbing 3-5 steps with a railing? : A Little 6 Click Score: 23    End of Session Equipment Utilized During Treatment: Gait belt Activity Tolerance: Patient tolerated treatment well Patient left: in bed Nurse Communication: Mobility status PT Visit Diagnosis: Muscle weakness (generalized) (M62.81)    Time: 9242-6834 PT Time Calculation (min) (ACUTE ONLY): 13 min   Charges:   PT Evaluation $PT Eval Low Complexity: 1 Low          Becki Mccaskill M. Fairly IV, PT, DPT Physical Therapist- [facility]  03/08/2021, 1:20 PM

## 2021-03-08 NOTE — Care Management Important Message (Signed)
Important Message  Patient Details  Name: Sarah [name] MRN: [id number] Date of Birth: 26-Oct-1955   Medicare Important Message Given:  Yes     [name] 03/08/2021, 3:11 PM

## 2021-03-09 MED ORDER — HEPARIN SOD (PORK) LOCK FLUSH 100 UNIT/ML IV SOLN
INTRAVENOUS | Status: AC
  Administered 2021-03-09: 500 [IU]
  Filled 2021-03-09: qty 5

## 2021-03-09 MED ORDER — AMOXICILLIN-POT CLAVULANATE 875-125 MG PO TABS: 1.0000 | ORAL_TABLET | Freq: Two times a day (BID) | ORAL | 0 refills | Status: AC

## 2021-03-09 NOTE — TOC Transition Note (Signed)
Transition of Care [facility]) - CM/SW Discharge Note   Patient Details  Name: Sarah [name] MRN: [id number] Date of Birth: 1955/10/20  Transition of Care [facility]) CM/SW Contact:  Shelbie Hutching, RN Phone Number: 03/09/2021, 12:31 PM   Clinical Narrative:    Patient is medically cleared for discharge home with home health PT today.  Patient agrees with [facility] recommendation and does not have an agency preference.  Tommi Rumps with [facility] accepted the referral.   Patient has a walker at home.  Her niece will be picking her up today.   Final next level of care: [place] Barriers to Discharge: Barriers Resolved   Patient Goals and CMS Choice Patient states their goals for this hospitalization and ongoing recovery are:: Patient agrees wth home health PT and is glad to be going home today CMS Medicare.gov Compare Post Acute Care list provided to:: Patient Choice offered to / list presented to : Patient  Discharge Placement                       Discharge Plan and Services                DME Arranged: N/A DME Agency: NA       HH Arranged: PT HH Agency: [place] Date [place]: 03/09/21 Time [place]: [phone] Representative spoke with at [place]: [place] ([place]) Interventions     Readmission Risk Interventions Readmission Risk Prevention Plan 03/06/2021 10/20/2019  Transportation Screening Complete Complete  PCP or Specialist Appt within 3-5 Days Complete Complete  HRI or Home Care Consult Complete Complete  Social Work Consult for [place] Planning/Counseling Complete -  Palliative Care Screening Not Applicable Complete  Medication Review (RN Care Manager) Referral to Pharmacy Referral to Pharmacy  Some recent data might be hidden

## 2021-03-09 NOTE — Discharge Summary (Signed)
Physician Discharge Summary  Sarah [name] RCV:[id number] DOB: 10-17-1955 DOA: 03/05/2021  PCP: Glean Hess, MD  Admit date: 03/05/2021 Discharge date: 03/09/2021  Admitted From: Home Disposition:  Home  Discharge Condition:Stable CODE STATUS:FULL Diet recommendation: Heart Healthy    Brief/Interim Summary:  Patient is a 65 year old female with history of multiple myeloma, chronic kidney disease stage IIIa, type 2 diabetes, chronic diastolic congestive heart failure who presented here with complaints of high-grade fever.  She has IV port for chemotherapy, last chemo was about 10 days ago, started having fever 5 days ago with chills.  Also complained of shortness of breath, mild cough which was nonproductive.  Patient also reported intermittent lower abdominal pain without diarrhea or constipation.  On presentation she was febrile at 101.3 Fahrenheit, blood pressure was stable.  Chest x-ray suspicious for  mild right lower lobe infiltrate.  Currently on cefepime.  Blood cultures have not shown any growth.  Respiratory status is stable. plan for discharge to home today with oral antibiotics.  Following problems were addressed during her hospitalization:   Fever /possible right lower lobe pneumonia: Presented with fever, shortness of breath, cough.  Chest x-ray showed  right lower lobe atelectasis vs pneumonia.   She is   afebrile since last 48 hrs. blood cultures no growth till date. UA was not that impressive for UTI.  Patient denies any dysuria.  Urine culture is showing 60,000 colonies of Enterococcus faecalis.  She was on cefepime, vancomycin, amoxicillin.  Antibiotics will be changed to Augmentin discharge for total of 7 days duration   abdominal pain/right hip pain: CT abdomen/pelvis did not show any intra-abdominal findings.  CT abdomen/pelvis did not show any osseous lesions.  No chest finding as per x-ray of the right hip .  Pain is improved.   Multiple myeloma: Follows with  oncology as an outpatient.  Has a Chemo-Port.  Last chemo about 10 days ago from admission date.   Chronic congestive heart failure: No evidence of volume overload.  Last echo done in 5/21 showed EF of 60 to 65%.   CKD stage IIIa: Currently kidney function stable   Mild thrombocytopenia: Most likely secondary to chemotherapy/multiple myeloma.  Continue to monitor as an outpatient   Hypomagnesemia: Supplemented and corrected   Debility deconditioning/weakness: PT recommended home health   Discharge Diagnoses:  Active Problems:   Multiple myeloma not having achieved remission ([place])   Right lower lobe pneumonia   Fever in adult    Discharge Instructions  Discharge Instructions     Diet - low sodium heart healthy   Complete by: As directed    Discharge instructions   Complete by: As directed    1)Please follow up with your PCP in a week 2)Take prescribed medications as instructed 3)Follow up with your oncologist as an outpatient.   Increase activity slowly   Complete by: As directed       Allergies as of 03/09/2021       Reactions   Oxycodone-acetaminophen Anaphylaxis   Swelling and rash   Celebrex [celecoxib] Diarrhea   Codeine    Plerixafor    In 2016 during ASCT collection patient developed fever to 103.48F and required hospitalization   Benadryl [diphenhydramine] Palpitations   Morphine Itching, Rash   Ondansetron Diarrhea   Tylenol [acetaminophen] Itching, Rash        Medication List     STOP taking these medications    lactulose 10 GM/15ML solution Commonly known as: CHRONULAC   lidocaine 5 % Commonly  known as: LIDODERM   loperamide 2 MG capsule Commonly known as: IMODIUM   promethazine-dextromethorphan 6.25-15 MG/5ML syrup Commonly known as: PROMETHAZINE-DM       TAKE these medications    acetaminophen 500 MG tablet Commonly known as: TYLENOL Take 500 mg by mouth every 6 (six) hours as needed.   acyclovir 400 MG tablet Commonly known  as: ZOVIRAX Take 400 mg by mouth 2 (two) times daily.   allopurinol 100 MG tablet Commonly known as: ZYLOPRIM TAKE 1 TABLET(100 MG) BY MOUTH DAILY as needed   amoxicillin-clavulanate 875-125 MG tablet Commonly known as: Augmentin Take 1 tablet by mouth 2 (two) times daily for 3 days.   bisoprolol 5 MG tablet Commonly known as: ZEBETA Take 5 mg by mouth daily.   diclofenac sodium 1 % Gel Commonly known as: VOLTAREN Apply 2 g topically 4 (four) times daily.   diphenhydrAMINE 50 MG capsule Commonly known as: BENADRYL Take 50 mg by mouth as needed.   diphenoxylate-atropine 2.5-0.025 MG tablet Commonly known as: LOMOTIL Take 2 tablets by mouth 4 (four) times daily as needed for diarrhea or loose stools.   FLUoxetine 40 MG capsule Commonly known as: PROZAC TAKE 1 CAPSULE EVERY DAY   fluticasone 50 MCG/ACT nasal spray Commonly known as: FLONASE Place 2 sprays into both nostrils daily.   gabapentin 300 MG capsule Commonly known as: NEURONTIN Take 1 capsule (300 mg total) by mouth 2 (two) times daily.   glucose blood test strip   Hair/Skin/Nails Tabs Take 1 tablet by mouth daily.   metFORMIN 500 MG 24 hr tablet Commonly known as: GLUCOPHAGE-XR Take 500 mg by mouth daily with breakfast.   methocarbamol 500 MG tablet Commonly known as: ROBAXIN TAKE 1 TABLET FOUR TIMES DAILY.   multivitamin tablet Take 1 tablet by mouth daily.   omeprazole 40 MG capsule Commonly known as: PRILOSEC Take 1 capsule (40 mg total) by mouth in the morning and at bedtime.   promethazine 25 MG tablet Commonly known as: PHENERGAN Take 1 tablet (25 mg total) by mouth every 6 (six) hours as needed for nausea or vomiting.   traZODone 100 MG tablet Commonly known as: DESYREL Take 1 tablet (100 mg total) by mouth at bedtime.        Follow-up Information     Glean Hess, MD. Schedule an appointment as soon as possible for a visit in 1 week(s).   Specialty: Internal  Medicine Contact information: [address] [place] [address] [phone]                Allergies  Allergen Reactions   Oxycodone-Acetaminophen Anaphylaxis    Swelling and rash   Celebrex [Celecoxib] Diarrhea   Codeine    Plerixafor     In 2016 during ASCT collection patient developed fever to 103.21F and required hospitalization   Benadryl [Diphenhydramine] Palpitations   Morphine Itching and Rash   Ondansetron Diarrhea   Tylenol [Acetaminophen] Itching and Rash    Consultations: None   Procedures/Studies: CT ABDOMEN PELVIS W CONTRAST  Result Date: 03/05/2021 CLINICAL DATA:  Provided history of: Appendicitis suspected, intermediate risk, no prior imaging (Ped 0-18y) Technologist notes state: Fever. Intermittent lower abdominal pain. History of multiple myeloma, chronic kidney disease, diabetes, cholecystectomy, hysterectomy, and bladder stimulator. EXAM: CT ABDOMEN AND PELVIS WITH CONTRAST TECHNIQUE: Multidetector CT imaging of the abdomen and pelvis was performed using the standard protocol following bolus administration of intravenous contrast. CONTRAST:  28m OMNIPAQUE IOHEXOL 350 MG/ML SOLN COMPARISON:  Included portion from PET CT 07/12/2019, abdominopelvic CT 10/16/2016 FINDINGS: Lower chest: Trace pleural thickening at the right lung base with adjacent atelectasis. No basilar pneumonia. Calcified lower paraesophageal nodes are unchanged from prior. Coronary artery calcifications are seen. Hepatobiliary: Chest tube Pancreas: No ductal dilatation or inflammation. Spleen: Normal in size without focal abnormality. Adrenals/Urinary Tract: Normal adrenal glands. Homogeneous renal enhancement. No hydronephrosis or perinephric edema. There may be a punctate nonobstructing stone in the lower left kidney. Cortical low-density in the mid left and medial right kidney are too small to characterize but likely small cysts. Unremarkable urinary bladder. Stomach/Bowel:  Decompressed stomach. No small bowel obstruction or inflammation. Normal appendix, series 5, image 34. small to moderate volume of colonic stool. No colonic wall thickening or inflammation. No significant diverticular disease. Vascular/Lymphatic: Moderate aortic atherosclerosis. No aortic aneurysm. Patent portal vein. No portal venous or mesenteric gas. No enlarged lymph nodes in the abdomen or pelvis. Reproductive: Status post hysterectomy. No adnexal masses. Other: No free air or ascites. Presacral stimulator in place with lead tip on the right. No abdominal wall hernia. Musculoskeletal: No blastic or destructive lytic lesions are seen. No acute osseous abnormalities are seen. Mild lumbar spine degenerative change. Remote right tenth rib fracture. IMPRESSION: 1. No acute abnormality or explanation for abdominal pain. Normal appendix. 2. Possible punctate nonobstructing stone in the lower left kidney. Aortic Atherosclerosis (ICD10-I70.0). Electronically Signed   By: Keith Rake M.D.   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 Normal heart size, mediastinal contours, and pulmonary vascularity. Mild RIGHT basilar atelectasis with minimal accentuation of interstitial markings versus prior study. Lungs otherwise clear. No definite infiltrate, pleural effusion, or pneumothorax. No acute osseous findings. IMPRESSION: Mild RIGHT basilar atelectasis. Electronically Signed   By: Lavonia Dana M.D.   On: 03/05/2021 14:02   DG HIP UNILAT WITH PELVIS 2-3 VIEWS RIGHT  Result Date: 03/08/2021 CLINICAL DATA:  Acute pelvic pain without known injury. EXAM: DG HIP (WITH OR WITHOUT PELVIS) 2-3V RIGHT COMPARISON:  None. FINDINGS:  There is no evidence of hip fracture or dislocation. There is no evidence of arthropathy or other focal bone abnormality. IMPRESSION: Negative. Electronically Signed   By: Marijo Conception M.D.   On: 03/08/2021 14:43      Subjective:  Patient seen and examined the bedside this morning. Hemodynamically  stable for discharge.  Discharge Exam: Vitals:   03/09/21 0525 03/09/21 0736  BP: 120/65 123/70  Pulse: 61 62  Resp: 18 18  Temp: 97.6 F (36.4 C) 98.3 F (36.8 C)  SpO2: 98% 98%   Vitals:   03/08/21 1536 03/08/21 1953 03/09/21 0525 03/09/21 0736  BP: (!) 108/53 137/82 120/65 123/70  Pulse: 69 71 61 62  Resp: 20 18 18 18   Temp: 98.4 F (36.9 C) 99.1 F (37.3 C) 97.6 F (36.4 C) 98.3 F (36.8 C)  TempSrc: Oral     SpO2: 95% 98% 98% 98%  Weight:      Height:        General: Pt is alert, awake, not in acute distress Cardiovascular: RRR, S1/S2 +, no rubs, no gallops Respiratory: CTA bilaterally, no wheezing, no rhonchi Abdominal: Soft, NT, ND, bowel sounds + Extremities: no edema, no cyanosis  The results of significant diagnostics from this hospitalization (including imaging, microbiology, ancillary and laboratory) are listed below for reference.     Microbiology: Recent Results (from the past 240 hour(s))  Blood culture (routine x 2)     Status: None (Preliminary result)   Collection Time: 03/05/21  1:58 PM   Specimen: BLOOD  Result Value Ref Range Status   Specimen Description BLOOD BLOOD RIGHT FOREARM  Final   Special Requests   Final    BOTTLES DRAWN AEROBIC AND ANAEROBIC Blood Culture adequate volume   Culture   Final    NO GROWTH 4 DAYS Performed at [facility], [address], [place], [place] [address]    Report Status PENDING  Incomplete  Resp Panel by RT-PCR (Flu A&B, Covid) Nasopharyngeal Swab     Status: None   Collection Time: 03/05/21  1:58 PM   Specimen: Nasopharyngeal Swab; Nasopharyngeal(NP) swabs in vial transport medium  Result  Value Ref Range Status   SARS Coronavirus 2 by RT PCR NEGATIVE NEGATIVE Final    Comment: (NOTE) SARS-CoV-2 target nucleic acids are NOT DETECTED.  The SARS-CoV-2 RNA is generally detectable in upper respiratory specimens during the acute phase of infection. The lowest concentration of SARS-CoV-2 viral copies this assay can detect is 138 copies/mL. A negative result does not preclude SARS-Cov-2 infection and should not be used as the sole basis for treatment or other patient management decisions. A negative result may occur with  improper specimen collection/handling, submission of specimen other than nasopharyngeal swab, presence of viral mutation(s) within the areas targeted by this assay, and inadequate number of viral copies(<138 copies/mL). A negative result must be combined with clinical observations, patient history, and epidemiological information. The expected result is Negative.  Fact Sheet for Patients:  EntrepreneurPulse.com.au  Fact Sheet for Healthcare Providers:  IncredibleEmployment.be  This test is no t yet approved or cleared by the [place] FDA and  has been authorized for detection and/or diagnosis of SARS-CoV-2 by FDA under an Emergency Use Authorization (EUA). This EUA will remain  in effect (meaning this test can be used) for the duration of the COVID-19 declaration under Section 564(b)(1) of the Act, 21 U.S.C.section 360bbb-3(b)(1), unless the authorization is terminated  or revoked sooner.       Influenza A by PCR NEGATIVE NEGATIVE Final   Influenza B by PCR NEGATIVE NEGATIVE Final    Comment: (NOTE) The Xpert Xpress SARS-CoV-2/FLU/RSV plus assay is intended as an aid in the diagnosis of influenza from Nasopharyngeal swab specimens and should not be used as a sole basis for treatment. Nasal washings and aspirates are unacceptable for Xpert Xpress SARS-CoV-2/FLU/RSV testing.  Fact Sheet for  Patients: EntrepreneurPulse.com.au  Fact Sheet for Healthcare Providers: IncredibleEmployment.be  This test is not yet approved or cleared by the [place] FDA and has been authorized for detection and/or diagnosis of SARS-CoV-2 by FDA under an Emergency Use Authorization (EUA). This EUA will remain in effect (meaning this test can be used) for the duration of the COVID-19 declaration under Section 564(b)(1) of the Act, 21 U.S.C. section 360bbb-3(b)(1), unless the authorization is terminated or revoked.  Performed at [facility], [address] [place], [place] [address]   Blood culture (routine x 2)     Status: None (Preliminary result)   Collection Time: 03/05/21  2:07 PM   Specimen: BLOOD  Result Value Ref Range Status   Specimen Description BLOOD PORTA CATH  Final   Special Requests   Final  BOTTLES DRAWN AEROBIC AND ANAEROBIC Blood Culture adequate volume   Culture   Final    NO GROWTH 4 DAYS Performed at [facility], [address] [place], [place] [address]    Report Status PENDING  Incomplete  Urine Culture     Status: Abnormal   Collection Time: 03/05/21  8:20 PM   Specimen: Urine, Clean Catch  Result Value Ref Range Status   Specimen Description   Final    URINE, CLEAN CATCH Performed at [facility], [address] [place], [place] [address]    Special Requests   Final    NONE Performed at [facility], [place], [place] [address]    Culture 60,000 COLONIES/mL ENTEROCOCCUS FAECALIS (A)  Final   Report Status 03/08/2021 FINAL  Final   Organism ID, Bacteria ENTEROCOCCUS FAECALIS (A)  Final      Susceptibility   Enterococcus faecalis - MIC*    AMPICILLIN <=2 SENSITIVE Sensitive     NITROFURANTOIN <=16 SENSITIVE Sensitive     VANCOMYCIN 1 SENSITIVE Sensitive     * 60,000 COLONIES/mL ENTEROCOCCUS FAECALIS  MRSA Next Gen by PCR, Nasal     Status: None    Collection Time: 03/06/21 10:08 AM   Specimen: Nasal Mucosa; Nasal Swab  Result Value Ref Range Status   MRSA by PCR Next Gen NOT DETECTED NOT DETECTED Final    Comment: (NOTE) The GeneXpert MRSA Assay (FDA approved for NASAL specimens only), is one component of a comprehensive MRSA colonization surveillance program. It is not intended to diagnose MRSA infection nor to guide or monitor treatment for MRSA infections. Test performance is not FDA approved in patients less than 30 years old. Performed at [facility], [address] [place], [place] [address]      Labs: BNP (last 3 results) No results for input(s): BNP in the last 8760 hours. Basic Metabolic Panel: Recent Labs  Lab 03/05/21 1357 03/06/21 0530 03/07/21 0518  NA 137 136 136  K 4.4 4.1 4.4  CL 103 104 105  CO2 25 24 21*  GLUCOSE 174* 149* 150*  BUN 26* 25* 19  CREATININE 1.26* 1.15* 0.98  CALCIUM 9.1 8.3* 8.7*  MG  --  1.3* 1.8  PHOS  --  3.6  --    Liver Function Tests: Recent Labs  Lab 03/05/21 1357  AST 25  ALT 24  ALKPHOS 111  BILITOT 0.5  PROT 6.6  ALBUMIN 3.5   Recent Labs  Lab 03/05/21 1357  LIPASE 28   No results for input(s): AMMONIA in the last 168 hours. CBC: Recent Labs  Lab 03/05/21 1357 03/06/21 0530 03/07/21 0518  WBC 7.6  PENDING 8.9 7.5  NEUTROABS 6.0  DUPLICATE REQUEST  --  5.7  HGB 10.3*  PENDING 10.2* 10.2*  HCT 30.6*  PENDING 29.8* 30.0*  MCV 83.8  PENDING 82.8 81.7  PLT 130*  PENDING 132* 123*   Cardiac Enzymes: No results for input(s): CKTOTAL, CKMB, CKMBINDEX, TROPONINI in the last 168 hours. BNP: Invalid input(s): POCBNP CBG: No results for input(s): GLUCAP in the last 168 hours. D-Dimer No results for input(s): DDIMER in the last 72 hours. Hgb A1c No results for input(s): HGBA1C in the last 72 hours. Lipid Profile No results for input(s): CHOL, HDL, LDLCALC, TRIG, CHOLHDL, LDLDIRECT in the last 72 hours. Thyroid function studies No  results for input(s): TSH, T4TOTAL, T3FREE, THYROIDAB in the last 72 hours.  Invalid input(s): FREET3 Anemia work up No  results for input(s): VITAMINB12, FOLATE, FERRITIN, TIBC, IRON, RETICCTPCT in the last 72 hours. Urinalysis    Component Value Date/Time   COLORURINE YELLOW 03/05/2021 2020   APPEARANCEUR CLEAR 03/05/2021 2020   APPEARANCEUR Clear 07/13/2018 0835   LABSPEC 1.015 03/05/2021 2020   PHURINE 5.5 03/05/2021 2020   GLUCOSEU NEGATIVE 03/05/2021 2020   HGBUR TRACE (A) 03/05/2021 2020   BILIRUBINUR NEGATIVE 03/05/2021 2020   BILIRUBINUR neg 11/25/2018 1021   BILIRUBINUR Negative 07/13/2018 [place] 03/05/2021 2020   PROTEINUR NEGATIVE 03/05/2021 2020   UROBILINOGEN 0.2 11/25/2018 1021   NITRITE NEGATIVE 03/05/2021 2020   LEUKOCYTESUR TRACE (A) 03/05/2021 2020   Sepsis Labs Invalid input(s): PROCALCITONIN,  WBC,  LACTICIDVEN Microbiology Recent Results (from the past 240 hour(s))  Blood culture (routine x 2)     Status: None (Preliminary result)   Collection Time: 03/05/21  1:58 PM   Specimen: BLOOD  Result Value Ref Range Status   Specimen Description BLOOD BLOOD RIGHT FOREARM  Final   Special Requests   Final    BOTTLES DRAWN AEROBIC AND ANAEROBIC Blood Culture adequate volume   Culture   Final    NO GROWTH 4 DAYS Performed at [facility], [address], [place], [place] [address]    Report Status PENDING  Incomplete  Resp Panel by RT-PCR (Flu A&B, Covid) Nasopharyngeal Swab     Status: None   Collection Time: 03/05/21  1:58 PM   Specimen: Nasopharyngeal Swab; Nasopharyngeal(NP) swabs in vial transport medium  Result Value Ref Range Status   SARS Coronavirus 2 by RT PCR NEGATIVE NEGATIVE Final    Comment: (NOTE) SARS-CoV-2 target nucleic acids are NOT DETECTED.  The SARS-CoV-2 RNA is generally detectable in upper respiratory specimens during the acute phase of infection. The lowest concentration of SARS-CoV-2 viral copies this  assay can detect is 138 copies/mL. A negative result does not preclude SARS-Cov-2 infection and should not be used as the sole basis for treatment or other patient management decisions. A negative result may occur with  improper specimen collection/handling, submission of specimen other than nasopharyngeal swab, presence of viral mutation(s) within the areas targeted by this assay, and inadequate number of viral copies(<138 copies/mL). A negative result must be combined with clinical observations, patient history, and epidemiological information. The expected result is Negative.  Fact Sheet for Patients:  EntrepreneurPulse.com.au  Fact Sheet for Healthcare Providers:  IncredibleEmployment.be  This test is no t yet approved or cleared by the [place] FDA and  has been authorized for detection and/or diagnosis of SARS-CoV-2 by FDA under an Emergency Use Authorization (EUA). This EUA will remain  in effect (meaning this test can be used) for the duration of the COVID-19 declaration under Section 564(b)(1) of the Act, 21 U.S.C.section 360bbb-3(b)(1), unless the authorization is terminated  or revoked sooner.       Influenza A by PCR NEGATIVE NEGATIVE Final   Influenza B by PCR NEGATIVE NEGATIVE Final    Comment: (NOTE) The Xpert Xpress SARS-CoV-2/FLU/RSV plus assay is intended as an aid in the diagnosis of influenza from Nasopharyngeal swab specimens and should not be used as a sole basis for treatment. Nasal washings and aspirates are unacceptable for Xpert Xpress SARS-CoV-2/FLU/RSV testing.  Fact Sheet for Patients: EntrepreneurPulse.com.au  Fact Sheet for Healthcare Providers: IncredibleEmployment.be  This test is not yet approved or cleared by the [place] FDA and has been authorized for detection and/or diagnosis of SARS-CoV-2 by FDA under an Emergency Use Authorization (  EUA). This EUA will  remain in effect (meaning this test can be used) for the duration of the COVID-19 declaration under Section 564(b)(1) of the Act, 21 U.S.C. section 360bbb-3(b)(1), unless the authorization is terminated or revoked.  Performed at [facility], [address] [place], [place] [address]   Blood culture (routine x 2)     Status: None (Preliminary result)   Collection Time: 03/05/21  2:07 PM   Specimen: BLOOD  Result Value Ref Range Status   Specimen Description BLOOD PORTA CATH  Final   Special Requests   Final    BOTTLES DRAWN AEROBIC AND ANAEROBIC Blood Culture adequate volume   Culture   Final    NO GROWTH 4 DAYS Performed at [facility], [address], [place], [place] [address]    Report Status PENDING  Incomplete  Urine Culture     Status: Abnormal   Collection Time: 03/05/21  8:20 PM   Specimen: Urine, Clean Catch  Result Value Ref Range Status   Specimen Description   Final    URINE, CLEAN CATCH Performed at [facility], [address] [place], [place] [address]    Special Requests   Final    NONE Performed at [facility], [place], [place] [address]    Culture 60,000 COLONIES/mL ENTEROCOCCUS FAECALIS (A)  Final   Report Status 03/08/2021 FINAL  Final   Organism ID, Bacteria ENTEROCOCCUS FAECALIS (A)  Final      Susceptibility   Enterococcus faecalis - MIC*    AMPICILLIN <=2 SENSITIVE Sensitive     NITROFURANTOIN <=16 SENSITIVE Sensitive     VANCOMYCIN 1 SENSITIVE Sensitive     * 60,000 COLONIES/mL ENTEROCOCCUS FAECALIS  MRSA Next Gen by PCR, Nasal     Status: None   Collection Time: 03/06/21 10:08 AM   Specimen: Nasal Mucosa; Nasal Swab  Result Value Ref Range Status   MRSA by PCR Next Gen NOT DETECTED NOT DETECTED Final    Comment: (NOTE) The GeneXpert MRSA Assay (FDA approved for NASAL specimens only), is one component of a comprehensive MRSA colonization surveillance program. It is not intended to  diagnose MRSA infection nor to guide or monitor treatment for MRSA infections. Test performance is not FDA approved in patients less than 61 years old. Performed at [facility], [address] [place], [place] [address]     Please note: You were cared for by a hospitalist during your hospital stay. Once you are discharged, your primary care physician will handle any further medical issues. Please note that NO REFILLS for any discharge medications will be authorized once you are discharged, as it is imperative that you return to your primary care physician (or establish a relationship with a primary care physician if you do not have one) for your post hospital discharge needs so that they can reassess your need for medications and monitor your lab values.    Time coordinating discharge: 40 minutes  SIGNED:   Shelly Coss, MD  [facility] 03/09/2021, 10:17 AM Pager [phone]  If 7PM-7AM, please contact night-coverage [email or web address] Password [email or web address]

## 2021-03-09 NOTE — Progress Notes (Addendum)
Patient discharged to home. Discharge education and teaching provided to patient. All questions answers and IV removed. All belongings sent home with patient.

## 2021-03-10 LAB — CULTURE, BLOOD (ROUTINE X 2)
Culture: NO GROWTH
Culture: NO GROWTH
Special Requests: ADEQUATE
Special Requests: ADEQUATE

## 2021-03-12 ENCOUNTER — Telehealth: Payer: Self-pay

## 2021-03-12 NOTE — Telephone Encounter (Signed)
Transition Care Management Follow-up Telephone Call Date of discharge and from where: 03/09/21 [facility] How have you been since you were released from the hospital? Pt states she is doing okay, still feeling a little weak and tired.  Any questions or concerns? No  Items Reviewed: Did the pt receive and understand the discharge instructions provided? Yes  Medications obtained and verified? Yes  Other? No  Any new allergies since your discharge? No  Dietary orders reviewed? Yes Do you have support at home? Yes   Home Care and Equipment/Supplies: Were home health services ordered? yes If so, what is the name of the agency? [facility] Has the agency set up a time to come to the patient's home? No Were any new equipment or medical supplies ordered?  No  Functional Questionnaire: (I = Independent and D = Dependent) ADLs: I  Bathing/Dressing- I  Meal Prep- I  Eating- I  Maintaining continence- I  Transferring/Ambulation- I  Managing Meds- I  Follow up appointments reviewed:  PCP Hospital f/u appt confirmed? Yes  Scheduled to see Dr. Army Melia on 03/13/21 @ 2:00. Are transportation arrangements needed? No  If their condition worsens, is the pt aware to call PCP or go to the Emergency Dept.? Yes Was the patient provided with contact information for the PCP's office or ED? Yes Was to pt encouraged to call back with questions or concerns? Yes

## 2021-03-13 ENCOUNTER — Encounter: Payer: Self-pay | Admitting: Internal Medicine

## 2021-03-13 ENCOUNTER — Ambulatory Visit (INDEPENDENT_AMBULATORY_CARE_PROVIDER_SITE_OTHER): Payer: Medicare HMO | Admitting: Internal Medicine

## 2021-03-13 ENCOUNTER — Other Ambulatory Visit: Payer: Self-pay

## 2021-03-13 VITALS — BP 108/72 | HR 75 | Temp 98.5°F | Ht 63.0 in | Wt 151.0 lb

## 2021-03-13 DIAGNOSIS — J189 Pneumonia, unspecified organism: Secondary | ICD-10-CM | POA: Diagnosis not present

## 2021-03-13 DIAGNOSIS — R5381 Other malaise: Secondary | ICD-10-CM | POA: Diagnosis not present

## 2021-03-13 DIAGNOSIS — M25551 Pain in right hip: Secondary | ICD-10-CM

## 2021-03-13 DIAGNOSIS — Z23 Encounter for immunization: Secondary | ICD-10-CM

## 2021-03-13 DIAGNOSIS — C9000 Multiple myeloma not having achieved remission: Secondary | ICD-10-CM

## 2021-03-13 MED ORDER — TIZANIDINE HCL 2 MG PO TABS: 2.0000 mg | ORAL_TABLET | Freq: Four times a day (QID) | ORAL | 1 refills | Status: DC | PRN

## 2021-03-13 NOTE — Telephone Encounter (Signed)
Patient scheduled, notified and confirmed.

## 2021-03-13 NOTE — Progress Notes (Signed)
Date:  03/13/2021   Name:  Sarah [name]   DOB:  March 04, 1956   MRN:  [id number]   Chief Complaint: Hospitalization Follow-up (Pt still feels weak and tired, and having a few dizzy spells here and there /) and Flu Vaccine Hospital follow up.  Admitted to [facility] 03/05/21 to 03/09/21.  TOC call done on 03/12/21.  HPI Fever /possible right lower lobe pneumonia: Presented with fever, shortness of breath, cough.  Chest x-ray showed  right lower lobe atelectasis vs pneumonia.   She is   afebrile since last 48 hrs. blood cultures no growth till date. UA was not that impressive for UTI.  Patient denies any dysuria.  Urine culture is showing 60,000 colonies of Enterococcus faecalis.  She was on cefepime, vancomycin, amoxicillin.  Antibiotics will be changed to Augmentin discharge for total of 7 days duration.  MRSA not detected on nasal swab.  Blood cx neg.  >>> she has been home four days, gaining strength slowly.  Finished augmentin.  Recommend f/u in 6 weeks for repeat CXR.  abdominal pain/right hip pain: CT abdomen/pelvis did not show any intra-abdominal findings.  CT abdomen/pelvis did not show any osseous lesions.  No chest finding as per x-ray of the right hip .  Pain is improved.  >>> her hip feels much better.  Walking without issues.  Mild balance but no recent falls.  Multiple myeloma: Follows with oncology as an outpatient.  Has a Chemo-Port.  Last chemo about 10 days ago from admission date.   Chronic congestive heart failure: No evidence of volume overload.  Last echo done in 5/21 showed EF of 60 to 65%.   CKD stage IIIa: Currently kidney function stable   Mild thrombocytopenia: Most likely secondary to chemotherapy/multiple myeloma.  Continue to monitor as an outpatient   Hypomagnesemia: Supplemented and corrected   Debility deconditioning/weakness: PT recommended home health.  >>> She does not feel that she needs HHPT at this time.  We discussed home exercises, walking, etc to increase  strength and stamina.  Lab Results  Component Value Date   CREATININE 0.98 03/07/2021   BUN 19 03/07/2021   NA 136 03/07/2021   K 4.4 03/07/2021   CL 105 03/07/2021   CO2 21 (L) 03/07/2021   Lab Results  Component Value Date   CHOL 177 12/05/2020   HDL 59 12/05/2020   LDLCALC 94 12/05/2020   TRIG 140 12/05/2020   CHOLHDL 3.0 12/05/2020   Lab Results  Component Value Date   TSH 2.105 08/20/2018   Lab Results  Component Value Date   HGBA1C 6.6 (H) 11/09/2020   Lab Results  Component Value Date   WBC 7.5 03/07/2021   HGB 10.2 (L) 03/07/2021   HCT 30.0 (L) 03/07/2021   MCV 81.7 03/07/2021   PLT 123 (L) 03/07/2021   Lab Results  Component Value Date   ALT 24 03/05/2021   AST 25 03/05/2021   ALKPHOS 111 03/05/2021   BILITOT 0.5 03/05/2021     Review of Systems  Constitutional:  Positive for fatigue. Negative for chills, fever and unexpected weight change.  HENT:  Negative for trouble swallowing.   Respiratory:  Positive for shortness of breath. Negative for chest tightness and wheezing.   Cardiovascular:  Positive for palpitations. Negative for chest pain and leg swelling.  Gastrointestinal:  Negative for abdominal pain.  Genitourinary:  Negative for dysuria.  Neurological:  Positive for light-headedness. Negative for dizziness and headaches.  Psychiatric/Behavioral:  Negative for dysphoric mood  and sleep disturbance. The patient is not nervous/anxious.    Patient Active Problem List   Diagnosis Date Noted   Fever in adult 03/05/2021   PVC (premature ventricular contraction) 12/04/2020   Diarrhea 10/25/2020   Encounter for antineoplastic chemotherapy 08/31/2020   Coronary artery disease involving native coronary artery of native heart 04/26/2020   Yeast vaginitis 03/18/2020   Hypokalemia 03/11/2020   Dyspnea 03/02/2020   Physical deconditioning 02/07/2020   Impaired nutrition 02/07/2020   Right lower lobe pneumonia 11/13/2019   Encounter for antineoplastic  immunotherapy 10/03/2019   Hypocalcemia 08/28/2019   Stage 3 chronic kidney disease ([place]) 05/12/2019   Chronic nausea 12/17/2018   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   Hyperlipidemia associated with type 2 diabetes mellitus ([place]) 11/25/2018   Anemia 10/21/2018   Hypomagnesemia 08/20/2018   Goals of care, counseling/discussion 08/20/2018   B12 deficiency 08/20/2018   Thrombocytopenia ([place]) 02/09/2018   Benign essential HTN 08/21/2017   Carotid artery stenosis, asymptomatic, bilateral 08/06/2017   Thyroid nodule 08/06/2017   Iron deficiency anemia due to chronic blood loss 05/13/2017   Spinal stenosis of lumbar region with neurogenic claudication 04/09/2017   Long term prescription opiate use 09/07/2016   Chronic pain syndrome 07/08/2016   Pain medication agreement signed 07/08/2016   Spondylosis of lumbar region without myelopathy or radiculopathy 07/08/2016   Severe aortic stenosis 06/05/2016   Environmental and seasonal allergies 04/19/2016   Insomnia 04/19/2016   Asthma 04/19/2016   Aortic regurgitation 04/19/2016   Type II diabetes mellitus with complication ([place]) 76/16/0737   Depression, major, single episode, in partial remission ([place]) 04/12/2016   Irritable bowel syndrome (IBS) 04/12/2016   Hepatic steatosis 04/12/2016   Multiple myeloma not having achieved remission ([place]) 04/02/2016   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[place]) 07/06/2015   Stem cell transplant candidate 05/16/2015    Allergies  Allergen Reactions   Oxycodone-Acetaminophen Anaphylaxis    Swelling and rash   Celebrex [Celecoxib] Diarrhea   Codeine    Plerixafor     In 2016 during ASCT collection patient developed fever to 103.31F and required hospitalization   Benadryl [Diphenhydramine] Palpitations   Morphine Itching and Rash   Ondansetron Diarrhea   Tylenol [Acetaminophen] Itching and Rash    Past Surgical History:  Procedure Laterality Date   ABDOMINAL HYSTERECTOMY      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   CARDIAC ELECTROPHYSIOLOGY MAPPING AND ABLATION     CARPAL TUNNEL RELEASE Bilateral    CHOLECYSTECTOMY  2008   COLONOSCOPY WITH PROPOFOL N/A 05/07/2017   Procedure: COLONOSCOPY WITH PROPOFOL;  Surgeon: [name], MD;  Location[facility] ENDOSCOPY;  Service: Gastroenterology;  Laterality: N/A;   COLONOSCOPY WITH PROPOFOL N/A 12/19/2020   Procedure: COLONOSCOPY WITH PROPOFOL;  Surgeon: [name], MD;  Location: [facility] ENDOSCOPY;  Service: Gastroenterology;  Laterality: N/A;   ESOPHAGOGASTRODUODENOSCOPY (EGD) WITH PROPOFOL N/A 05/07/2017   Procedure: ESOPHAGOGASTRODUODENOSCOPY (EGD) WITH PROPOFOL;  Surgeon: [name], MD;  Location: [facility] ENDOSCOPY;  Service: Gastroenterology;  Laterality: N/A;   FOOT SURGERY Bilateral    INCONTINENCE SURGERY  2009   INTERSTIM IMPLANT PLACEMENT     other     over active bladder   OTHER SURGICAL HISTORY     bladder stimulator    PARTIAL HYSTERECTOMY  03/1996   fibroids   PORTA CATH INSERTION N/A 03/10/2019   Procedure: PORTA CATH INSERTION;  Surgeon: Algernon Huxley, MD;  Location: [facility] LAB;  Service: Cardiovascular;  Laterality: N/A;   TONSILLECTOMY  2007  Social History   Tobacco Use   Smoking status: Former    Packs/day: 1.00    Years: 20.00    Pack years: 20.00    Types: Cigarettes    Quit date: 07/02/1991    Years since quitting: 29.7   Smokeless tobacco: Never  Vaping Use   Vaping Use: Never used  Substance Use Topics   Alcohol use: Yes    Comment: occasionally   Drug use: No     Medication list has been reviewed and updated.  Current Meds  Medication Sig   acetaminophen (TYLENOL) 500 MG tablet Take 500 mg by mouth every 6 (six) hours as needed.   acyclovir (ZOVIRAX) 400 MG tablet Take 400 mg by mouth 2 (two) times daily.   allopurinol (ZYLOPRIM) 100 MG tablet TAKE 1 TABLET(100 MG) BY MOUTH DAILY as needed   bisoprolol (ZEBETA) 5 MG tablet Take 5 mg by mouth daily.   diclofenac sodium  (VOLTAREN) 1 % GEL Apply 2 g topically 4 (four) times daily.   diphenhydrAMINE (BENADRYL) 50 MG capsule Take 50 mg by mouth as needed.   diphenoxylate-atropine (LOMOTIL) 2.5-0.025 MG tablet Take 2 tablets by mouth 4 (four) times daily as needed for diarrhea or loose stools.   FLUoxetine (PROZAC) 40 MG capsule TAKE 1 CAPSULE EVERY DAY   fluticasone (FLONASE) 50 MCG/ACT nasal spray Place 2 sprays into both nostrils daily.   gabapentin (NEURONTIN) 300 MG capsule Take 1 capsule (300 mg total) by mouth 2 (two) times daily.   glucose blood test strip    methocarbamol (ROBAXIN) 500 MG tablet TAKE 1 TABLET FOUR TIMES DAILY.   Multiple Vitamin (MULTIVITAMIN) tablet Take 1 tablet by mouth daily.   Multiple Vitamins-Minerals (HAIR/SKIN/NAILS) TABS Take 1 tablet by mouth daily.   omeprazole (PRILOSEC) 40 MG capsule Take 1 capsule (40 mg total) by mouth in the morning and at bedtime.   promethazine (PHENERGAN) 25 MG tablet Take 1 tablet (25 mg total) by mouth every 6 (six) hours as needed for nausea or vomiting.   traZODone (DESYREL) 100 MG tablet Take 1 tablet (100 mg total) by mouth at bedtime.   [DISCONTINUED] metFORMIN (GLUCOPHAGE-XR) 500 MG 24 hr tablet Take 500 mg by mouth daily with breakfast.    PHQ 2/9 Scores 03/13/2021 12/05/2020 04/03/2020 02/07/2020  PHQ - 2 Score 2 1 2 1   PHQ- 9 Score 11 2 2 8     GAD 7 : Generalized Anxiety Score 03/13/2021 12/05/2020 04/03/2020 11/30/2019  Nervous, Anxious, on Edge 2 1 3 3   Control/stop worrying 1 1 3  0  Worry too much - different things 1 1 3  0  Trouble relaxing 2 1 0 3  Restless 2 1 0 3  Easily annoyed or irritable 0 1 0 1  Afraid - awful might happen 0 1 0 0  Total GAD 7 Score 8 7 9 10   Anxiety Difficulty - Not difficult at all Not difficult at all Not difficult at all    BP Readings from Last 3 Encounters:  03/13/21 108/72  03/09/21 110/74  02/27/21 124/83    Physical Exam Constitutional:      Appearance: Normal appearance.  Cardiovascular:      Rate and Rhythm: Normal rate and regular rhythm.     Pulses: Normal pulses.     Heart sounds: No murmur heard. Pulmonary:     Effort: Pulmonary effort is normal.     Breath sounds: Normal breath sounds. No wheezing or rhonchi.  Musculoskeletal:     Cervical  back: Normal range of motion.  Lymphadenopathy:     Cervical: No cervical adenopathy.  Skin:    General: Skin is warm.  Neurological:     General: No focal deficit present.     Mental Status: She is alert.    Wt Readings from Last 3 Encounters:  03/13/21 151 lb (68.5 kg)  03/05/21 157 lb 3 oz (71.3 kg)  02/16/21 153 lb 6.4 oz (69.6 kg)    BP 108/72   Pulse 75   Temp 98.5 F (36.9 C) (Oral)   Ht 5' 3"  (1.6 m)   Wt 151 lb (68.5 kg)   SpO2 97%   BMI 26.75 kg/m   Assessment and Plan: 1. Pneumonia of right lower lobe due to infectious organism Completed course of outpatient treatment after IV coverage. Cultures all negative. Symptoms resolving. Recommend follow up in 6 weeks with repeat CXR  2. Acute hip pain, right Stop robaxin; try lower dose zanaflex Symptoms are improved.  Imaging was negative for pathology. - tiZANidine (ZANAFLEX) 2 MG tablet; Take 1 tablet (2 mg total) by mouth every 6 (six) hours as needed for muscle spasms.  Dispense: 30 tablet; Refill: 1  3. Hypomagnesemia supplements  4. Physical deconditioning Home exercise discussed Can refer to HHPT if desired.   Partially dictated using Editor, commissioning. Any errors are unintentional.  Halina Maidens, MD [facility]  03/13/2021

## 2021-03-13 NOTE — Telephone Encounter (Signed)
Please schedule patient for lab/MD/ Velcade/ Mag next week and notify pt of appt. Thanks

## 2021-03-14 ENCOUNTER — Encounter: Admission: RE | Payer: Self-pay | Source: Home / Self Care

## 2021-03-14 ENCOUNTER — Ambulatory Visit: Admission: RE | Admit: 2021-03-14 | Payer: Medicare HMO | Source: Home / Self Care | Admitting: Gastroenterology

## 2021-03-14 SURGERY — ESOPHAGOGASTRODUODENOSCOPY (EGD) WITH PROPOFOL
Anesthesia: General

## 2021-03-15 ENCOUNTER — Telehealth: Payer: Self-pay | Admitting: Internal Medicine

## 2021-03-15 NOTE — Telephone Encounter (Signed)
Skeet Simmer needs verbal approval to move start of care date to tomorrow per patient's request. [place] contact: [phone]

## 2021-03-16 NOTE — Telephone Encounter (Signed)
Spoke to [place] gave him verbal approval. Verbalized understanding.  KP

## 2021-03-19 ENCOUNTER — Inpatient Hospital Stay (HOSPITAL_BASED_OUTPATIENT_CLINIC_OR_DEPARTMENT_OTHER): Payer: Medicare HMO | Admitting: Oncology

## 2021-03-19 ENCOUNTER — Inpatient Hospital Stay: Payer: Medicare HMO

## 2021-03-19 ENCOUNTER — Telehealth: Payer: Self-pay

## 2021-03-19 ENCOUNTER — Inpatient Hospital Stay: Payer: Medicare HMO | Attending: Oncology

## 2021-03-19 ENCOUNTER — Encounter: Payer: Self-pay | Admitting: Oncology

## 2021-03-19 VITALS — BP 122/73 | HR 68 | Temp 98.6°F | Resp 17 | Wt 152.0 lb

## 2021-03-19 DIAGNOSIS — Z803 Family history of malignant neoplasm of breast: Secondary | ICD-10-CM | POA: Diagnosis not present

## 2021-03-19 DIAGNOSIS — Z8249 Family history of ischemic heart disease and other diseases of the circulatory system: Secondary | ICD-10-CM | POA: Insufficient documentation

## 2021-03-19 DIAGNOSIS — Z841 Family history of disorders of kidney and ureter: Secondary | ICD-10-CM | POA: Insufficient documentation

## 2021-03-19 DIAGNOSIS — C9110 Chronic lymphocytic leukemia of B-cell type not having achieved remission: Secondary | ICD-10-CM | POA: Insufficient documentation

## 2021-03-19 DIAGNOSIS — Z801 Family history of malignant neoplasm of trachea, bronchus and lung: Secondary | ICD-10-CM | POA: Diagnosis not present

## 2021-03-19 DIAGNOSIS — Z808 Family history of malignant neoplasm of other organs or systems: Secondary | ICD-10-CM | POA: Insufficient documentation

## 2021-03-19 DIAGNOSIS — Z5112 Encounter for antineoplastic immunotherapy: Secondary | ICD-10-CM | POA: Insufficient documentation

## 2021-03-19 DIAGNOSIS — T451X5A Adverse effect of antineoplastic and immunosuppressive drugs, initial encounter: Secondary | ICD-10-CM | POA: Diagnosis not present

## 2021-03-19 DIAGNOSIS — Z87891 Personal history of nicotine dependence: Secondary | ICD-10-CM | POA: Insufficient documentation

## 2021-03-19 DIAGNOSIS — Z832 Family history of diseases of the blood and blood-forming organs and certain disorders involving the immune mechanism: Secondary | ICD-10-CM | POA: Insufficient documentation

## 2021-03-19 DIAGNOSIS — R197 Diarrhea, unspecified: Secondary | ICD-10-CM | POA: Diagnosis not present

## 2021-03-19 DIAGNOSIS — E538 Deficiency of other specified B group vitamins: Secondary | ICD-10-CM | POA: Diagnosis not present

## 2021-03-19 DIAGNOSIS — Z800 Family history of malignant neoplasm of digestive organs: Secondary | ICD-10-CM | POA: Insufficient documentation

## 2021-03-19 DIAGNOSIS — Z8379 Family history of other diseases of the digestive system: Secondary | ICD-10-CM | POA: Insufficient documentation

## 2021-03-19 DIAGNOSIS — Z79899 Other long term (current) drug therapy: Secondary | ICD-10-CM | POA: Diagnosis not present

## 2021-03-19 DIAGNOSIS — N183 Chronic kidney disease, stage 3 unspecified: Secondary | ICD-10-CM | POA: Insufficient documentation

## 2021-03-19 DIAGNOSIS — Z5111 Encounter for antineoplastic chemotherapy: Secondary | ICD-10-CM

## 2021-03-19 DIAGNOSIS — Z833 Family history of diabetes mellitus: Secondary | ICD-10-CM | POA: Diagnosis not present

## 2021-03-19 DIAGNOSIS — E1122 Type 2 diabetes mellitus with diabetic chronic kidney disease: Secondary | ICD-10-CM | POA: Insufficient documentation

## 2021-03-19 DIAGNOSIS — M879 Osteonecrosis, unspecified: Secondary | ICD-10-CM | POA: Insufficient documentation

## 2021-03-19 DIAGNOSIS — G620 Drug-induced polyneuropathy: Secondary | ICD-10-CM

## 2021-03-19 DIAGNOSIS — C9001 Multiple myeloma in remission: Secondary | ICD-10-CM

## 2021-03-19 DIAGNOSIS — I350 Nonrheumatic aortic (valve) stenosis: Secondary | ICD-10-CM | POA: Insufficient documentation

## 2021-03-19 DIAGNOSIS — M549 Dorsalgia, unspecified: Secondary | ICD-10-CM | POA: Diagnosis not present

## 2021-03-19 DIAGNOSIS — D509 Iron deficiency anemia, unspecified: Secondary | ICD-10-CM | POA: Insufficient documentation

## 2021-03-19 DIAGNOSIS — Z822 Family history of deafness and hearing loss: Secondary | ICD-10-CM | POA: Insufficient documentation

## 2021-03-19 DIAGNOSIS — C9000 Multiple myeloma not having achieved remission: Secondary | ICD-10-CM

## 2021-03-19 DIAGNOSIS — M2550 Pain in unspecified joint: Secondary | ICD-10-CM | POA: Insufficient documentation

## 2021-03-19 LAB — COMPREHENSIVE METABOLIC PANEL
ALT: 20 U/L (ref 0–44)
AST: 25 U/L (ref 15–41)
Albumin: 3.8 g/dL (ref 3.5–5.0)
Alkaline Phosphatase: 96 U/L (ref 38–126)
Anion gap: 10 (ref 5–15)
BUN: 24 mg/dL — ABNORMAL HIGH (ref 8–23)
CO2: 23 mmol/L (ref 22–32)
Calcium: 9.2 mg/dL (ref 8.9–10.3)
Chloride: 104 mmol/L (ref 98–111)
Creatinine, Ser: 1.15 mg/dL — ABNORMAL HIGH (ref 0.44–1.00)
GFR, Estimated: 53 mL/min — ABNORMAL LOW (ref >60)
Glucose, Bld: 137 mg/dL — ABNORMAL HIGH (ref 70–99)
Potassium: 4.2 mmol/L (ref 3.5–5.1)
Sodium: 137 mmol/L (ref 135–145)
Total Bilirubin: 0.7 mg/dL (ref 0.3–1.2)
Total Protein: 7 g/dL (ref 6.5–8.1)

## 2021-03-19 LAB — CBC WITH DIFFERENTIAL/PLATELET
Abs Immature Granulocytes: 0.04 10*3/uL (ref 0.00–0.07)
Basophils Absolute: 0.1 10*3/uL (ref 0.0–0.1)
Basophils Relative: 1 %
Eosinophils Absolute: 0.3 10*3/uL (ref 0.0–0.5)
Eosinophils Relative: 4 %
HCT: 33.5 % — ABNORMAL LOW (ref 36.0–46.0)
Hemoglobin: 10.9 g/dL — ABNORMAL LOW (ref 12.0–15.0)
Immature Granulocytes: 1 %
Lymphocytes Relative: 19 %
Lymphs Abs: 1.2 10*3/uL (ref 0.7–4.0)
MCH: 27.5 pg (ref 26.0–34.0)
MCHC: 32.5 g/dL (ref 30.0–36.0)
MCV: 84.6 fL (ref 80.0–100.0)
Monocytes Absolute: 0.6 10*3/uL (ref 0.1–1.0)
Monocytes Relative: 9 %
Neutro Abs: 4.5 10*3/uL (ref 1.7–7.7)
Neutrophils Relative %: 66 %
Platelets: 150 10*3/uL (ref 150–400)
RBC: 3.96 MIL/uL (ref 3.87–5.11)
RDW: 14.3 % (ref 11.5–15.5)
WBC: 6.7 10*3/uL (ref 4.0–10.5)
nRBC: 0 % (ref 0.0–0.2)

## 2021-03-19 LAB — MAGNESIUM: Magnesium: 1.2 mg/dL — ABNORMAL LOW (ref 1.7–2.4)

## 2021-03-19 MED ORDER — MAGNESIUM SULFATE 4 GM/100ML IV SOLN
4.0000 g | Freq: Once | INTRAVENOUS | Status: AC
  Administered 2021-03-19: 4 g via INTRAVENOUS
  Filled 2021-03-19: qty 100

## 2021-03-19 MED ORDER — HEPARIN SOD (PORK) LOCK FLUSH 100 UNIT/ML IV SOLN
500.0000 [IU] | Freq: Once | INTRAVENOUS | Status: AC
  Filled 2021-03-19: qty 5

## 2021-03-19 MED ORDER — SODIUM CHLORIDE 0.9 % IV SOLN
25.0000 mg | Freq: Once | INTRAVENOUS | Status: AC
  Administered 2021-03-19: 25 mg via INTRAVENOUS
  Filled 2021-03-19: qty 1

## 2021-03-19 MED ORDER — BORTEZOMIB CHEMO SQ INJECTION 3.5 MG (2.5MG/ML)
2.0000 mg | Freq: Once | INTRAMUSCULAR | Status: AC
  Administered 2021-03-19: 2 mg via SUBCUTANEOUS
  Filled 2021-03-19: qty 0.8

## 2021-03-19 MED ORDER — MAGNESIUM SULFATE 2 GM/50ML IV SOLN
2.0000 g | Freq: Once | INTRAVENOUS | Status: AC
  Administered 2021-03-19: 2 g via INTRAVENOUS
  Filled 2021-03-19: qty 50

## 2021-03-19 MED ORDER — HEPARIN SOD (PORK) LOCK FLUSH 100 UNIT/ML IV SOLN
INTRAVENOUS | Status: AC
  Administered 2021-03-19: 500 [IU] via INTRAVENOUS
  Filled 2021-03-19: qty 5

## 2021-03-19 MED ORDER — SODIUM CHLORIDE 0.9% FLUSH
10.0000 mL | INTRAVENOUS | Status: DC | PRN
  Filled 2021-03-19: qty 10

## 2021-03-19 MED ORDER — SODIUM CHLORIDE 0.9 % IV SOLN: 6.0000 g | Freq: Once | INTRAVENOUS | Status: DC

## 2021-03-19 MED ORDER — SODIUM CHLORIDE 0.9 % IV SOLN
Freq: Once | INTRAVENOUS | Status: AC
  Filled 2021-03-19: qty 250

## 2021-03-19 NOTE — Patient Instructions (Signed)
[facility] [place] [facility]  Discharge Instructions: Thank you for choosing [facility] to provide your oncology and hematology care.  If you have a lab appointment with the Cancer Center, please go directly to the Cancer Center and check in at the registration area.  Wear comfortable clothing and clothing appropriate for easy access to any Portacath or PICC line.   We strive to give you quality time with your provider. You may need to reschedule your appointment if you arrive late (15 or more minutes).  Arriving late affects you and other patients whose appointments are after yours.  Also, if you miss three or more appointments without notifying the office, you may be dismissed from the clinic at the provider's discretion.      For prescription refill requests, have your pharmacy contact our office and allow 72 hours for refills to be completed.    Today you received the following chemotherapy and/or immunotherapy agents Velcade      To help prevent nausea and vomiting after your treatment, we encourage you to take your nausea medication as directed.  BELOW ARE SYMPTOMS THAT SHOULD BE REPORTED IMMEDIATELY: *FEVER GREATER THAN 100.4 F (38 C) OR HIGHER *CHILLS OR SWEATING *NAUSEA AND VOMITING THAT IS NOT CONTROLLED WITH YOUR NAUSEA MEDICATION *UNUSUAL SHORTNESS OF BREATH *UNUSUAL BRUISING OR BLEEDING *URINARY PROBLEMS (pain or burning when urinating, or frequent urination) *BOWEL PROBLEMS (unusual diarrhea, constipation, pain near the anus) TENDERNESS IN MOUTH AND THROAT WITH OR WITHOUT PRESENCE OF ULCERS (sore throat, sores in mouth, or a toothache) UNUSUAL RASH, SWELLING OR PAIN  UNUSUAL VAGINAL DISCHARGE OR ITCHING   Items with * indicate a potential emergency and should be followed up as soon as possible or go to the Emergency Department if any problems should occur.  Please show the CHEMOTHERAPY ALERT CARD or IMMUNOTHERAPY ALERT CARD at check-in to  the Emergency Department and triage nurse.  Should you have questions after your visit or need to cancel or reschedule your appointment, please contact [facility] [place] [facility]  [phone] and follow the prompts.  Office hours are 8:00 a.m. to 4:30 p.m. Monday - Friday. Please note that voicemails left after 4:00 p.m. may not be returned until the following business day.  We are closed weekends and major holidays. You have access to a nurse at all times for urgent questions. Please call the main number to the clinic [phone] and follow the prompts.  For any non-urgent questions, you may also contact your provider using MyChart. We now offer e-Visits for anyone 18 and older to request care online for non-urgent symptoms. For details visit [email or web address].   Also download the MyChart app! Go to the app store, search "MyChart", open the app, select [facility], and log in with your MyChart username and password.  Due to Covid, a mask is required upon entering the hospital/clinic. If you do not have a mask, one will be given to you upon arrival. For doctor visits, patients may have 1 support person aged 18 or older with them. For treatment visits, patients cannot have anyone with them due to current Covid guidelines and our immunocompromised population.  

## 2021-03-19 NOTE — Telephone Encounter (Signed)
Patients cholesterol is all normal. She has never had an out of range cholesterol panel. Statin is NOT indicated for this patient.

## 2021-03-19 NOTE — Progress Notes (Signed)
Patient here for oncology follow-up appointment,  concerns of of recent pneumonia(cough & SOB)

## 2021-03-19 NOTE — Progress Notes (Signed)
Mg 1.2. Per Amie Portland., RN per Dr. Tasia Catchings, patient is to receive 6g of Magnesium.

## 2021-03-19 NOTE — Progress Notes (Signed)
Hematology Oncology Progress Note  Clinic Day:  03/19/2021   Referring physician: Glean Hess, MD  Chief Complaint: Sarah [name] is a 65 y.o. 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   Initial bone marrow revealed 80% plasma cells.  Lambda free light chains were 1340.  She had nephrotic range proteinuria.  She initially underwent induction with RVD.  Revlimid maintenance was discontinued on 01/21/2017 secondary to intolerance.     07/19/2019 Bone marrow aspirate and biopsy on  revealed a normocellular marrow with but increased lambda-restricted plasma cells (9% aspirate, 40% CD138 immunohistochemistry).  Findings were consistent with recurrent plasma cell myeloma.  Flow cytometry revealed no monoclonal B-cell or phenotypically aberrant T-cell population. Cytogenetics were 62, XX (normal).  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    Lambda light chains have been followed: 22.2 (ratio 0.56) on 07/03/2017, 30.8 (ratio 0.78) on 09/02/2017, 36.9 (ratio 0.40) on 10/21/2017, 37.4 (ratio 0.41) on 12/16/2017, 70.7(ratio 0.31)  on 02/17/2018, 64.2 (ratio 0.27) on 04/07/2018, 78.9 (ratio 0.18) on 05/26/2018, 128.8 (ratio 0.17) on 08/06/2018, 181.5 (ratio 0.13) on 10/08/2018, 130.9 (ratio 0.13) on 10/20/2018, 160.7 (ratio 0.10) on 12/09/2018, 236.6 (ratio 0.07) on 02/01/2019,  363.6 (ratio 0.04) on 03/22/2019, 404.8 (ratio 0.04) on 04/05/2019, 420.7 (ratio 0.03) on 05/24/2019, 573.4 (ratio 0.03) on 06/23/2019, 451.05 (ratio 0.02) on 08/20/2019, 47.2 (ratio 0.13) on 10/25/2019, 22.4 (ratio 0.21) on 11/25/2019, 16.5 (ratio 0.33) on 01/10/2020, 14.6 (ratio 0.34) on 02/07/2020, 13.1 (ratio 0.31) on 03/07/2020, 10.1 (ratio 0.38) on 04/10/2020, and 9.5 (ratio 0.21) on 06/19/2020.   24 hour UPEP on 06/03/2019 revealed kappa free light chains 95.76, lambda free light chains 1,260.71, and ratio 0.08.  24 hour UPEP on 08/23/2019 revealed total protein of 782 mg/24 hrs with lambda free light chains 1,084.16 mg/L and ratio of 0.10 (1.03-31.76).  M spike in urine was 46.1% (361 mg/24 hrs).    Bone survey on 04/08/2016 and 05/28/2017 revealed no definite lytic lesion seen in the visualized skeleton.  Bone survey on 11/19/2018 revealed no suspicious lucent lesions and no acute bony abnormality.  PET scan on 07/12/2019 revealed no focal metabolic activity to suggest active myeloma within the skeleton. There were no lytic lesions identified on the CT portion of the exam or soft tissue plasmacytomas. There was no evidence of multiple myeloma.    Pretreatment RBC phenotype on 09/23/2019 was positive for C, e, DUFFY B, KIDD B, M, S, and s antigen; negative for c, E, KELL, DUFFY A, KIDD A, and N antigen.    09/27/2019 - 10/25/2019; 12/09/2019 - 03/13/2020 6 cycles of daratumumab and hyaluronidase-fihj, Pomalyst, and Decadron (DPd) .  Cycle #1 was complicated by fever and neutropenia requiring admission.  Cycle #2 was complicated by pneumonia requiring admission.  Cycle #6 was complicated with an ER evaluation for an elevated lactic acid.   05/10/2020 second autologous stem cell transplant at [facility]   She underwent conditioning with melphalan 140  mg/m2.    She is s/p week #3 Velcade maintenance (began 08/31/2020 - 09/28/2020).  She has no increase in neuropathy.  She has severe aortic stenosis.   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 Cardiology is following for a possible TAVR in the future.  Echo on 07/05/2020 revealed moderate to severe aortic stenosis with an EF of 50-55%.  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 Zometa was discontinued in 01/2017.  She has chronic nausea on Phenergan.     B12 deficiency.  B12 was 254 on 04/09/2017, 295 on 08/20/2018, and 391 on 10/08/2018.  She was on oral B12.  She received B12 monthly (last 09/14/2020).  Folate was 12.1 on 01/10/2020.    She has iron deficiency.  Ferritin was 32 on 07/01/2019.  She received Venofer on 07/15/2019 and 07/22/2019.   She has hypogammaglobulinemia.  IgG was 245 on 11/25/2019.  She received monthly IVIG (07/22/021 - 03/22/2020).  She received IVIG 400 mg/kg on 01/20/2020, 200 mg/kg on 02/17/2020, and 300 mg/kg on 03/22/2020.  IVIG on 63/81/7711 was complicated by acute renal failure. IgG trough level was 418 on 02/17/2020.  10/15/2019 - 10/20/2019 She was admitted to [facility] with fever and neutropenia.  Cultures were negative.  CXR was negative. She received broad spectrum antibiotics and daily Granix.  She received IVF for acute renal insufficiency due to diarrhea and dehydration.  Creatine was 1.66 on admission and 1.12 on discharge.    03/01/2020 - 03/05/2020 admitted to [facility] from with fever and neutropenia. CXR revealed no active cardiopulmonary disease. Chest CT with contrast revealed no acute intrathoracic pathology. There were findings which could be suggestive of prior granulomatous disease. She was treated with Cefepime and Vancomycin, then switched to ciprofloxacin on 03/03/2020.   05/08/2020 - 12/05/2021The patient was admitted to [facility] from  for autologous bone marrow transplant.   She received conditioning with melphalan 140 mg/m2.  Bone marrow transplant was on 05/10/2020. The patient developed fevers daily from  05/19/2020 - 05/24/2020. Blood cultures were + for strept sanguis bacteremia.  She was treated cefepime, vancomycin, and solumedrol for possible engraftment syndrome. Cefepime was switched to ceftriaxone; she completed 2 weeks of ceftriaxone on 06/03/2020. She had chemotherapy induced diarrhea.  She experienced urinary retention.  Feb 2022 patient has been on maintenance Velcade 2 mg every 2 weeks.  Chronic diarrhea seen by gastroenterology Dr. Vicente Males and was recommended to avoid artificial sugars in her diet including Diet Coke and coffee mate.  She feels some improvement of her diarrhea frequency since the dietary modification.  GI profile PCR negative, C. difficile toxin A+ B negative, fecal calprotectin 77 12/19/2020 colonoscopy showed 2 subcentimeter polyps in the ascending colon, resected and removed.  Pathology showed tubular adenoma.  No malignancy.  Normal mucosa in the entire examined colon.  Biopsied, pathology showed colonic mucosa with no significant pathological alteration.  Negative for active inflammation and features of chronicity.  Negative for microscopic colitis, dysplasia, and malignanc  Diabetes, patient has discontinued metformin due to diarrhea and started on glipizide 2.5 mg  INTERVAL HISTORY Sarah [name] is a 65 y.o. female who has above history reviewed by me today presents for follow up visit for management of multiple myeloma Problems and complaints are listed below: 03/05/2021-03/09/2021 Patient was hospitalized due to fever and chills, nonproductive cough, shortness of breath. X-ray showed right lower lobe atelectasis versus pneumonia.  Blood cultures negative.  Urine culture showed 60,000 colonies of Enterococcus facialis.  Patient received antibiotics. Patient also had abdominal pelvis CT scan for work-up of intermittent lower abdomen pain.No acute abnormality.   Chronic diarrhea and patient takes Lomotil.2 tablets  every 4 hours as needed. Symptom is stable.   Chronic neuropathy on gabapentin.  Chronic magnesium, she gets IV magnesium twice weekly with each visit.  She has some nasal discharge today.she attributes to seasonal allergy. No fever, chills, sob. Abdominal pain. Occasional palpation.        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   Due to Zometa   Cardiomyopathy, idiopathic ([place]) 08/21/2017   Overview:  Septal and apical hypokinesis with ef 40%   CHF (congestive heart failure) (HCC)    CKD (chronic kidney disease) stage 3, GFR 30-59 ml/min (HCC)    Depression    Diabetes mellitus (HCC)    Dizziness    Fatty liver    Frequent falls    GERD (gastroesophageal reflux disease)    Gout    Heart murmur    History of blood transfusion    History of bone marrow transplant ([place])    History of uterine fibroid    Hx of cardiac catheterization 06/05/2016   Overview:  Normal coronaries 2017   Hypertension    Hypomagnesemia    IDA (iron deficiency anemia)    Multiple myeloma ([place])    Personal history of chemotherapy    Renal cyst    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[place]) 06/05/2016   Overview:  Sp ablation 1999    Past Surgical History:  Procedure Laterality Date   ABDOMINAL HYSTERECTOMY     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   CARDIAC ELECTROPHYSIOLOGY MAPPING AND ABLATION     CARPAL TUNNEL RELEASE Bilateral    CHOLECYSTECTOMY  2008   COLONOSCOPY WITH PROPOFOL N/A 05/07/2017   Procedure: COLONOSCOPY WITH PROPOFOL;  Surgeon: [name], MD;  Location: [facility] ENDOSCOPY;  Service: Gastroenterology;  Laterality: N/A;   COLONOSCOPY WITH PROPOFOL N/A 12/19/2020   Procedure: COLONOSCOPY WITH PROPOFOL;  Surgeon: [name], MD;  Location: [facility] ENDOSCOPY;  Service: Gastroenterology;  Laterality: N/A;    ESOPHAGOGASTRODUODENOSCOPY (EGD) WITH PROPOFOL N/A 05/07/2017   Procedure: ESOPHAGOGASTRODUODENOSCOPY (EGD) WITH PROPOFOL;  Surgeon: [name], MD;  Location: [facility] ENDOSCOPY;  Service: Gastroenterology;  Laterality: N/A;   FOOT SURGERY Bilateral    INCONTINENCE SURGERY  2009   INTERSTIM IMPLANT PLACEMENT     other     over active bladder   OTHER SURGICAL HISTORY     bladder stimulator    PARTIAL HYSTERECTOMY  03/1996   fibroids   PORTA CATH INSERTION N/A 03/10/2019   Procedure: PORTA CATH INSERTION;  Surgeon: Algernon Huxley, MD;  Location: [place] CV LAB;  Service: Cardiovascular;  Laterality: N/A;   TONSILLECTOMY  2007    Family History  Problem Relation Age of Onset   Colon cancer Father    Renal Disease Father    Diabetes Mellitus II Father    Melanoma Paternal Grandmother    Breast cancer Maternal Aunt 34   Anemia Mother    Heart disease Mother    Heart failure Mother    Renal Disease Mother    Congestive Heart Failure Mother  Heart disease Maternal Uncle    Throat cancer Maternal Uncle    Lung cancer Maternal Uncle    Liver disease Maternal Uncle    Heart failure Maternal Uncle    Hearing loss Son 24       Suicide     Social History:  reports that she quit smoking about 29 years ago. Her smoking use included cigarettes. She has a 20.00 pack-year smoking history. She has never used smokeless tobacco. She reports current alcohol use. She reports that she does not use drugs.  She is on disability. She notes exposure to perchloroethylene [facility]).  She lives much of her adult life in [place].   Her husband passed away.    Allergies:  Allergies  Allergen Reactions   Oxycodone-Acetaminophen Anaphylaxis    Swelling and rash   Celebrex [Celecoxib] Diarrhea   Codeine    Plerixafor     In 2016 during ASCT collection patient developed fever to 103.63F and required hospitalization   Benadryl [Diphenhydramine] Palpitations   Morphine Itching and Rash    Ondansetron Diarrhea   Tylenol [Acetaminophen] Itching and Rash    Current Medications: Current Outpatient Medications  Medication Sig Dispense Refill   acetaminophen (TYLENOL) 500 MG tablet Take 500 mg by mouth every 6 (six) hours as needed.     acyclovir (ZOVIRAX) 400 MG tablet Take 400 mg by mouth 2 (two) times daily.     allopurinol (ZYLOPRIM) 100 MG tablet TAKE 1 TABLET(100 MG) BY MOUTH DAILY as needed 90 tablet 1   bisoprolol (ZEBETA) 5 MG tablet Take 5 mg by mouth daily.     diclofenac sodium (VOLTAREN) 1 % GEL Apply 2 g topically 4 (four) times daily. 100 g 1   diphenhydrAMINE (BENADRYL) 50 MG capsule Take 50 mg by mouth as needed.     diphenoxylate-atropine (LOMOTIL) 2.5-0.025 MG tablet Take 2 tablets by mouth 4 (four) times daily as needed for diarrhea or loose stools. 64 tablet 3   FLUoxetine (PROZAC) 40 MG capsule TAKE 1 CAPSULE EVERY DAY 90 capsule 1   fluticasone (FLONASE) 50 MCG/ACT nasal spray Place 2 sprays into both nostrils daily. 16 g 2   gabapentin (NEURONTIN) 300 MG capsule Take 1 capsule (300 mg total) by mouth 2 (two) times daily. 60 capsule 0   glucose blood test strip      Multiple Vitamin (MULTIVITAMIN) tablet Take 1 tablet by mouth daily.     Multiple Vitamins-Minerals (HAIR/SKIN/NAILS) TABS Take 1 tablet by mouth daily.     omeprazole (PRILOSEC) 40 MG capsule Take 1 capsule (40 mg total) by mouth in the morning and at bedtime. 180 capsule 1   promethazine (PHENERGAN) 25 MG tablet Take 1 tablet (25 mg total) by mouth every 6 (six) hours as needed for nausea or vomiting. 90 tablet 1   tiZANidine (ZANAFLEX) 2 MG tablet Take 1 tablet (2 mg total) by mouth every 6 (six) hours as needed for muscle spasms. 30 tablet 1   traZODone (DESYREL) 100 MG tablet Take 1 tablet (100 mg total) by mouth at bedtime. 90 tablet 1   No current facility-administered medications for this visit.   Facility-Administered Medications Ordered in Other Visits  Medication Dose Route Frequency  Provider Last Rate Last Admin   0.9 %  sodium chloride infusion   Intravenous Continuous Lequita Asal, MD   Stopped at 01/20/20 1232   0.9 %  sodium chloride infusion   Intravenous Continuous Lequita Asal, MD   Stopped at 12/19/20  0856   0.9 %  sodium chloride infusion   Intravenous Continuous Earlie Server, MD 10 mL/hr at 11/30/20 0957 New Bag at 11/30/20 0957   heparin lock flush 100 unit/mL  500 Units Intravenous Once Faythe Casa E, NP       heparin lock flush 100 unit/mL  500 Units Intravenous Once Nolon Stalls C, MD       heparin lock flush 100 unit/mL  500 Units Intravenous Once Earlie Server, MD       sodium chloride flush (NS) 0.9 % injection 10 mL  10 mL Intravenous PRN Nolon Stalls C, MD   10 mL at 02/07/20 0815   sodium chloride flush (NS) 0.9 % injection 10 mL  10 mL Intracatheter PRN Cammie Sickle, MD   10 mL at 02/10/20 0815   sodium chloride flush (NS) 0.9 % injection 10 mL  10 mL Intravenous PRN Jacquelin Hawking, NP   10 mL at 03/01/20 1143   sodium chloride flush (NS) 0.9 % injection 10 mL  10 mL Intravenous PRN Earlie Server, MD   10 mL at 01/19/21 1044    Review of Systems  Constitutional:  Negative for chills, fever, malaise/fatigue and weight loss.  HENT:  Negative for sore throat.   Eyes:  Negative for redness.  Respiratory:  Negative for cough, shortness of breath and wheezing.   Cardiovascular:  Negative for chest pain, palpitations and leg swelling.  Gastrointestinal:  Positive for diarrhea and nausea. Negative for abdominal pain, blood in stool and vomiting.  Genitourinary:  Negative for dysuria.  Musculoskeletal:  Positive for back pain and joint pain. Negative for myalgias.  Skin:  Negative for rash.  Neurological:  Positive for tingling and sensory change. Negative for dizziness and tremors.  Endo/Heme/Allergies:  Does not bruise/bleed easily.  Psychiatric/Behavioral:  Negative for hallucinations.    Performance status (ECOG):  1  Vitals Blood pressure 122/73, pulse 68, temperature 98.6 F (37 C), temperature source Tympanic, resp. rate 17, weight 152 lb (68.9 kg), SpO2 98 %.  Physical Exam Vitals and nursing note reviewed.  Constitutional:      General: She is not in acute distress.    Appearance: She is well-developed. She is not ill-appearing, toxic-appearing or diaphoretic.     Interventions: Face mask in place.  HENT:     Head: Normocephalic and atraumatic.     Mouth/Throat:     Mouth: Mucous membranes are moist.     Pharynx: Oropharynx is clear.  Eyes:     General: No scleral icterus.    Pupils: Pupils are equal, round, and reactive to light.  Cardiovascular:     Rate and Rhythm: Normal rate and regular rhythm.     Heart sounds: Normal heart sounds. No murmur heard. Pulmonary:     Effort: Pulmonary effort is normal. No respiratory distress.     Breath sounds: Normal breath sounds. No wheezing or rales.  Chest:     Chest wall: No tenderness.  Abdominal:     General: Bowel sounds are normal. There is no distension.     Palpations: Abdomen is soft. There is no hepatomegaly, splenomegaly or mass.     Tenderness: There is no abdominal tenderness. There is no guarding or rebound.  Musculoskeletal:        General: No tenderness. Normal range of motion.     Cervical back: Normal range of motion and neck supple.     Right lower leg: No edema.     Left lower  leg: No edema.  Lymphadenopathy:     Head:     Right side of head: No preauricular, posterior auricular or occipital adenopathy.     Left side of head: No preauricular, posterior auricular or occipital adenopathy.     Cervical: No cervical adenopathy.     Upper Body:     Right upper body: No supraclavicular adenopathy.     Left upper body: No supraclavicular adenopathy.  Skin:    General: Skin is warm and dry.  Neurological:     Mental Status: She is alert and oriented to person, place, and time. Mental status is at baseline.  Psychiatric:         Mood and Affect: Mood normal.   Laboratory data Infusion on 03/19/2021  Component Date Value Ref Range Status   Magnesium 03/19/2021 1.2 (A) 1.7 - 2.4 mg/dL Final   Performed at [facility] [place], [place] [address]   Sodium 03/19/2021 137  135 - 145 mmol/L Final   Potassium 03/19/2021 4.2  3.5 - 5.1 mmol/L Final   Chloride 03/19/2021 104  98 - 111 mmol/L Final   CO2 03/19/2021 23  22 - 32 mmol/L Final   Glucose, Bld 03/19/2021 137 (A) 70 - 99 mg/dL Final   Glucose reference range applies only to samples taken after fasting for at least 8 hours.   BUN 03/19/2021 24 (A) 8 - 23 mg/dL Final   Creatinine, Ser 03/19/2021 1.15 (A) 0.44 - 1.00 mg/dL Final   Calcium 03/19/2021 9.2  8.9 - 10.3 mg/dL Final   Total Protein 03/19/2021 7.0  6.5 - 8.1 g/dL Final   Albumin 03/19/2021 3.8  3.5 - 5.0 g/dL Final   AST 03/19/2021 25  15 - 41 U/L Final   ALT 03/19/2021 20  0 - 44 U/L Final   Alkaline Phosphatase 03/19/2021 96  38 - 126 U/L Final   Total Bilirubin 03/19/2021 0.7  0.3 - 1.2 mg/dL Final   GFR, Estimated 03/19/2021 53 (A) >60 mL/min Final   Comment: (NOTE) Calculated using the CKD-EPI Creatinine Equation (2021)    Anion gap 03/19/2021 10  5 - 15 Final   Performed at [facility], [place], [place] [address]   WBC 03/19/2021 6.7  4.0 - 10.5 K/uL Final   RBC 03/19/2021 3.96  3.87 - 5.11 MIL/uL Final   Hemoglobin 03/19/2021 10.9 (A) 12.0 - 15.0 g/dL Final   HCT 03/19/2021 33.5 (A) 36.0 - 46.0 % Final   MCV 03/19/2021 84.6  80.0 - 100.0 fL Final   MCH 03/19/2021 27.5  26.0 - 34.0 pg Final   MCHC 03/19/2021 32.5  30.0 - 36.0 g/dL Final   RDW 03/19/2021 14.3  11.5 - 15.5 % Final   Platelets 03/19/2021 150  150 - 400 K/uL Final   nRBC 03/19/2021 0.0  0.0 - 0.2 % Final   Neutrophils Relative % 03/19/2021 66  % Final   Neutro Abs 03/19/2021 4.5  1.7 - 7.7 K/uL Final   Lymphocytes Relative 03/19/2021 19  % Final   Lymphs Abs 03/19/2021 1.2   0.7 - 4.0 K/uL Final   Monocytes Relative 03/19/2021 9  % Final   Monocytes Absolute 03/19/2021 0.6  0.1 - 1.0 K/uL Final   Eosinophils Relative 03/19/2021 4  % Final   Eosinophils Absolute 03/19/2021 0.3  0.0 - 0.5 K/uL Final   Basophils Relative 03/19/2021 1  % Final   Basophils Absolute 03/19/2021 0.1  0.0 - 0.1  K/uL Final   Immature Granulocytes 03/19/2021 1  % Final   Abs Immature Granulocytes 03/19/2021 0.04  0.00 - 0.07 K/uL Final   Performed at [facility], [address], [place], [place] [address]    Assessment/Plan 1. Encounter for antineoplastic chemotherapy   2. Multiple myeloma in remission ([place])   3. 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 B12 deficiency     # Recurrent lambda light chain multiple myeloma s/p second autologous bone marrow transplant [05/10/2020] Clinically patient is doing well.  Labs are reviewed and discussed with patient. Proceed with Velcade 50m SubQ Check multiple myeloma panel, light chain ratio monthly  Continue prophylaxis with Valtrex up to 1 year after transplant-November 2022  [facility] ([phone].  #Hyperglobulinemia post stem cell transplantation.  Currently she has no frequent infection, hold off IVIG replacement therapy.  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   She agrees with trying gabapentin. Start with 309mdaily, titrate to 30035mID if needed.   # iron deficiency anemia, Hemoglobin 10.9  #chronic diarrhea, Gastroenterology recommendation was reviewed.  Avoid artificial sugar.  Colonoscopy showed no microscopic colitis. Her symptom seems to have improved slightly after dietary modification. Velcade side effects may also contribute to diarrhea  #Chronic hypomagnesemia Magnesium is 1.2 today, proceed with  IV magnesium today. Off Slow-Mag 64 mg twice daily.  Per GI recommendation as this potentially precipitate her  diarrhea. continue magnesium checked twice a week +/- IV magnesium  #Dysphagia, chronic nausea, patient request to have IV Phenergan in the clinic with treatment.    # B12 deficiency, patient has been on vitamin B12 monthly. Last dose 11/09/2020. Vitamin B12 is normal at 431    RTC: 1 week, lab magnesium/IV magnesium twice per week,  RTC in 2 weeks for labs (CBC with diff, CMP, Mg ), md  for  Velcade and IV Mg + B12 injection.   I discussed the assessment and treatment plan with the patient.  The patient was provided an opportunity to ask questions and all were answered.  The patient agreed with the plan and demonstrated an understanding of the instructions.  The patient was advised to call back if the symptoms worsen or if the condition fails to improve as anticipated.   ZhoEarlie ServerD, PhD 03/19/2021

## 2021-03-19 NOTE — Telephone Encounter (Signed)
Copied from [place] [id number]. Topic: General - Other >> Mar 19, 2021  3:27 PM Tessa Lerner A wrote: Reason for CRM: Ysidro Evert with [facility] has reached out regarding a recommendation for the patient  to be placed on a statin therapy to close a potential therapy gap  please contact further when possible

## 2021-03-22 ENCOUNTER — Other Ambulatory Visit: Payer: Self-pay

## 2021-03-23 ENCOUNTER — Inpatient Hospital Stay: Payer: Medicare HMO

## 2021-03-23 ENCOUNTER — Encounter: Payer: Self-pay | Admitting: Oncology

## 2021-03-23 NOTE — Telephone Encounter (Signed)
Can you please cancel today's appt. Per Pt request

## 2021-03-25 ENCOUNTER — Other Ambulatory Visit: Payer: Self-pay | Admitting: Internal Medicine

## 2021-03-25 DIAGNOSIS — F324 Major depressive disorder, single episode, in partial remission: Secondary | ICD-10-CM

## 2021-03-26 ENCOUNTER — Inpatient Hospital Stay (HOSPITAL_BASED_OUTPATIENT_CLINIC_OR_DEPARTMENT_OTHER): Payer: Medicare HMO | Admitting: Nurse Practitioner

## 2021-03-26 ENCOUNTER — Ambulatory Visit
Admission: RE | Admit: 2021-03-26 | Discharge: 2021-03-26 | Disposition: A | Discharge status: Home / Self Care | Payer: Medicare HMO | Source: Ambulatory Visit | Attending: Nurse Practitioner | Admitting: Nurse Practitioner

## 2021-03-26 ENCOUNTER — Other Ambulatory Visit: Payer: Self-pay | Admitting: *Deleted

## 2021-03-26 ENCOUNTER — Other Ambulatory Visit: Payer: Self-pay

## 2021-03-26 ENCOUNTER — Inpatient Hospital Stay: Payer: Medicare HMO

## 2021-03-26 VITALS — BP 123/70 | HR 88 | Temp 98.8°F | Resp 20

## 2021-03-26 DIAGNOSIS — E538 Deficiency of other specified B group vitamins: Secondary | ICD-10-CM

## 2021-03-26 DIAGNOSIS — M879 Osteonecrosis, unspecified: Secondary | ICD-10-CM | POA: Diagnosis not present

## 2021-03-26 DIAGNOSIS — R059 Cough, unspecified: Secondary | ICD-10-CM | POA: Diagnosis not present

## 2021-03-26 DIAGNOSIS — E1122 Type 2 diabetes mellitus with diabetic chronic kidney disease: Secondary | ICD-10-CM | POA: Diagnosis not present

## 2021-03-26 DIAGNOSIS — Z03818 Encounter for observation for suspected exposure to other biological agents ruled out: Secondary | ICD-10-CM | POA: Diagnosis not present

## 2021-03-26 DIAGNOSIS — Z5112 Encounter for antineoplastic immunotherapy: Secondary | ICD-10-CM | POA: Diagnosis not present

## 2021-03-26 DIAGNOSIS — Z8579 Personal history of other malignant neoplasms of lymphoid, hematopoietic and related tissues: Secondary | ICD-10-CM | POA: Insufficient documentation

## 2021-03-26 DIAGNOSIS — R197 Diarrhea, unspecified: Secondary | ICD-10-CM | POA: Diagnosis not present

## 2021-03-26 DIAGNOSIS — I350 Nonrheumatic aortic (valve) stenosis: Secondary | ICD-10-CM | POA: Diagnosis not present

## 2021-03-26 DIAGNOSIS — C9000 Multiple myeloma not having achieved remission: Secondary | ICD-10-CM

## 2021-03-26 DIAGNOSIS — C9110 Chronic lymphocytic leukemia of B-cell type not having achieved remission: Secondary | ICD-10-CM | POA: Diagnosis not present

## 2021-03-26 DIAGNOSIS — N183 Chronic kidney disease, stage 3 unspecified: Secondary | ICD-10-CM | POA: Diagnosis not present

## 2021-03-26 DIAGNOSIS — J189 Pneumonia, unspecified organism: Secondary | ICD-10-CM | POA: Diagnosis not present

## 2021-03-26 DIAGNOSIS — Z20822 Contact with and (suspected) exposure to covid-19: Secondary | ICD-10-CM | POA: Diagnosis not present

## 2021-03-26 DIAGNOSIS — M549 Dorsalgia, unspecified: Secondary | ICD-10-CM | POA: Diagnosis not present

## 2021-03-26 DIAGNOSIS — C9001 Multiple myeloma in remission: Secondary | ICD-10-CM

## 2021-03-26 LAB — COMPREHENSIVE METABOLIC PANEL
ALT: 28 U/L (ref 0–44)
AST: 31 U/L (ref 15–41)
Albumin: 4.1 g/dL (ref 3.5–5.0)
Alkaline Phosphatase: 93 U/L (ref 38–126)
Anion gap: 9 (ref 5–15)
BUN: 31 mg/dL — ABNORMAL HIGH (ref 8–23)
CO2: 22 mmol/L (ref 22–32)
Calcium: 8.7 mg/dL — ABNORMAL LOW (ref 8.9–10.3)
Chloride: 104 mmol/L (ref 98–111)
Creatinine, Ser: 1.11 mg/dL — ABNORMAL HIGH (ref 0.44–1.00)
GFR, Estimated: 56 mL/min — ABNORMAL LOW (ref >60)
Glucose, Bld: 136 mg/dL — ABNORMAL HIGH (ref 70–99)
Potassium: 3.7 mmol/L (ref 3.5–5.1)
Sodium: 135 mmol/L (ref 135–145)
Total Bilirubin: 0.3 mg/dL (ref 0.3–1.2)
Total Protein: 7.2 g/dL (ref 6.5–8.1)

## 2021-03-26 LAB — CBC WITH DIFFERENTIAL/PLATELET
Abs Immature Granulocytes: 0.04 10*3/uL (ref 0.00–0.07)
Basophils Absolute: 0 10*3/uL (ref 0.0–0.1)
Basophils Relative: 0 %
Eosinophils Absolute: 0.2 10*3/uL (ref 0.0–0.5)
Eosinophils Relative: 3 %
HCT: 34.3 % — ABNORMAL LOW (ref 36.0–46.0)
Hemoglobin: 11.2 g/dL — ABNORMAL LOW (ref 12.0–15.0)
Immature Granulocytes: 1 %
Lymphocytes Relative: 17 %
Lymphs Abs: 1 10*3/uL (ref 0.7–4.0)
MCH: 27.5 pg (ref 26.0–34.0)
MCHC: 32.7 g/dL (ref 30.0–36.0)
MCV: 84.1 fL (ref 80.0–100.0)
Monocytes Absolute: 0.6 10*3/uL (ref 0.1–1.0)
Monocytes Relative: 10 %
Neutro Abs: 4.1 10*3/uL (ref 1.7–7.7)
Neutrophils Relative %: 69 %
Platelets: 108 10*3/uL — ABNORMAL LOW (ref 150–400)
RBC: 4.08 MIL/uL (ref 3.87–5.11)
RDW: 14.9 % (ref 11.5–15.5)
WBC: 5.9 10*3/uL (ref 4.0–10.5)
nRBC: 0 % (ref 0.0–0.2)

## 2021-03-26 LAB — MAGNESIUM: Magnesium: 1.2 mg/dL — ABNORMAL LOW (ref 1.7–2.4)

## 2021-03-26 LAB — VITAMIN B12: Vitamin B-12: 404 pg/mL (ref 180–914)

## 2021-03-26 MED ORDER — SODIUM CHLORIDE 0.9 % IV SOLN
25.0000 mg | Freq: Once | INTRAVENOUS | Status: AC
  Administered 2021-03-26: 25 mg via INTRAVENOUS
  Filled 2021-03-26: qty 1

## 2021-03-26 MED ORDER — SODIUM CHLORIDE 0.9 % IV SOLN
6.0000 g | Freq: Once | INTRAVENOUS | Status: AC
  Administered 2021-03-26: 6 g via INTRAVENOUS
  Filled 2021-03-26: qty 10

## 2021-03-26 MED ORDER — HEPARIN SOD (PORK) LOCK FLUSH 100 UNIT/ML IV SOLN
INTRAVENOUS | Status: AC
  Administered 2021-03-26: 500 [IU] via INTRAVENOUS
  Filled 2021-03-26: qty 5

## 2021-03-26 MED ORDER — SODIUM CHLORIDE 0.9 % IV SOLN
Freq: Once | INTRAVENOUS | Status: AC
  Filled 2021-03-26: qty 250

## 2021-03-26 MED ORDER — HEPARIN SOD (PORK) LOCK FLUSH 100 UNIT/ML IV SOLN
500.0000 [IU] | Freq: Once | INTRAVENOUS | Status: AC
  Filled 2021-03-26: qty 5

## 2021-03-26 MED ORDER — SODIUM CHLORIDE 0.9% FLUSH
10.0000 mL | INTRAVENOUS | Status: DC | PRN
  Administered 2021-03-26: 10 mL via INTRAVENOUS
  Filled 2021-03-26: qty 10

## 2021-03-26 MED ORDER — PROMETHAZINE-DM 6.25-15 MG/5ML PO SYRP: 5.0000 mL | ORAL_SOLUTION | Freq: Four times a day (QID) | ORAL | 0 refills | Status: DC | PRN

## 2021-03-26 NOTE — Progress Notes (Signed)
Symptom Management [place]  [phone] [phone]  Patient Care Team: Glean Hess, MD as PCP - General (Internal Medicine) Jodell Cipro, MD as Referring Physician (Pain Medicine) Jennye Boroughs, MD as Consulting Physician (Cardiology) Earlie Server, MD as Consulting Physician (Oncology)   Name of the patient: Sarah [name]  [id number]  1956-03-19   Date of visit: 03/26/21  Diagnosis-multiple myeloma  Chief complaint/ Reason for visit-cough  Heme/Onc history:  Oncology History Overview Note  # June 2017- IgALamda MULTIPLE MYELOMA [BMBx- 80% plasma cells; [facility]]; Lamda light chain 1340; s/p RVD   # 14th DEC 2016- Auto-Stem cell transplant [[facility];Dr.Hertzig]; rev [dose reduced sec to [place]  # Maintenance Revlimid- 60m 3 w-ON & 1 week OFF; HELD July 2018- sec to ONJ  # Bone lesions-numerous ~524mlytic lesions in Thoracic spine- on Zometa  # 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  She was admitted to hospital for pneumonia on 03/05/2021-03/09/2021.  At that time, chest  x-ray showed suspicious mild right lower lobe infiltrate.  She was febrile.  Blood cultures were negative. Today, she complains of cough since Saturday.  Frequent, at times productive.  No fevers or chills.  Cough is mildly productive.  She has not taken anything for her symptoms.  No nasal congestion.  Feels weak and tired.  Body aches which she thinks are related to coughing all night.  Coughing interferes with sleep.  Denies sick contacts  ECOG FS:2 - Symptomatic, <50% confined to bed  Review of systems- Review of Systems  Constitutional:  Negative for chills, fever, malaise/fatigue and weight loss.  HENT:  Negative for congestion, ear pain, hearing loss, nosebleeds, sinus pain, sore throat and tinnitus.   Eyes:  Negative for blurred vision and double vision.  Respiratory:  Positive for cough and sputum production. Negative for hemoptysis, shortness of breath and wheezing.   Cardiovascular:  Negative for chest pain, palpitations and leg swelling.  Gastrointestinal:  Negative for abdominal pain, blood in stool, constipation, diarrhea, melena, nausea and vomiting.  Genitourinary:  Negative for dysuria and urgency.  Musculoskeletal:  Negative for back pain, falls, joint pain and myalgias.  Skin:  Negative for itching and rash.  Neurological:  Positive for weakness. Negative for dizziness, tingling, sensory change, loss of consciousness and headaches.  Endo/Heme/Allergies:  Negative for environmental allergies. Does not bruise/bleed easily.  Psychiatric/Behavioral:  Negative for depression. The patient is not nervous/anxious and does not have insomnia.      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   Due to Zometa   Cardiomyopathy, idiopathic ([place]) 08/21/2017   Overview:  Septal and apical hypokinesis with ef 40%   CHF (congestive heart failure) (HCC)    CKD (chronic kidney disease) stage 3, GFR 30-59 ml/min (HCC)    Depression    Diabetes mellitus (HCC)    Dizziness    Fatty liver    Frequent falls    GERD (gastroesophageal reflux disease)    Gout    Heart murmur    History of blood transfusion    History of bone marrow transplant (Home)    History of uterine fibroid    Hx of cardiac catheterization 06/05/2016   Overview:  Normal coronaries 2017   Hypertension    Hypomagnesemia    IDA (iron deficiency anemia)    Multiple myeloma ([place])    Personal history of chemotherapy    Renal cyst    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[place]) 06/05/2016   Overview:  Sp ablation 1999    Past Surgical History:  Procedure Laterality Date   ABDOMINAL HYSTERECTOMY     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   CARDIAC ELECTROPHYSIOLOGY MAPPING AND ABLATION     CARPAL TUNNEL RELEASE Bilateral    CHOLECYSTECTOMY  2008   COLONOSCOPY WITH PROPOFOL N/A 05/07/2017   Procedure: COLONOSCOPY WITH PROPOFOL;  Surgeon: [name], MD;  Location: [facility] ENDOSCOPY;  Service: Gastroenterology;  Laterality: N/A;   COLONOSCOPY WITH PROPOFOL N/A 12/19/2020   Procedure: COLONOSCOPY WITH PROPOFOL;  Surgeon: [name], MD;  Location: [facility] ENDOSCOPY;  Service: Gastroenterology;  Laterality: N/A;   ESOPHAGOGASTRODUODENOSCOPY (EGD) WITH PROPOFOL N/A 05/07/2017   Procedure: ESOPHAGOGASTRODUODENOSCOPY (EGD) WITH PROPOFOL;  Surgeon: [name], MD;  Location: [facility] ENDOSCOPY;  Service: Gastroenterology;  Laterality: N/A;    FOOT SURGERY Bilateral    INCONTINENCE SURGERY  2009   INTERSTIM IMPLANT PLACEMENT     other     over active bladder   OTHER SURGICAL HISTORY     bladder stimulator    PARTIAL HYSTERECTOMY  03/1996   fibroids   PORTA CATH INSERTION N/A 03/10/2019   Procedure: PORTA CATH INSERTION;  Surgeon: Algernon Huxley, MD;  Location: [facility] CV LAB;  Service: Cardiovascular;  Laterality: N/A;   TONSILLECTOMY  2007    Social History   Socioeconomic History   Marital status: Widowed    Spouse name: Not on file   Number of children: Not on file   Years of education: Not on file   Highest education level: Not on file  Occupational History   Occupation: Disabled  Tobacco Use   Smoking status: Former    Packs/day: 1.00    Years: 20.00    Pack years: 20.00    Types: Cigarettes    Quit date: 07/02/1991    Years since quitting: 29.7   Smokeless tobacco: Never  Vaping Use   Vaping Use: Never used  Substance and Sexual Activity   Alcohol use: Yes    Comment: occasionally   Drug use:  No   Sexual activity: Never  Other Topics Concern   Not on file  Social History Narrative   Pt lives with her 2 sisters   Social Determinants of Health   Financial Resource Strain: Not on file  Food Insecurity: Not on file  Transportation Needs: Not on file  Physical Activity: Not on file  Stress: Not on file  Social Connections: Not on file  Intimate Partner Violence: Not on file    Family History  Problem Relation Age of Onset   Colon cancer Father    Renal Disease Father    Diabetes Mellitus II Father    Melanoma Paternal Grandmother    Breast cancer Maternal Aunt 29   Anemia Mother    Heart disease Mother    Heart failure Mother    Renal Disease Mother    Congestive Heart Failure Mother    Heart disease Maternal Uncle    Throat cancer Maternal Uncle    Lung cancer Maternal Uncle    Liver disease Maternal Uncle    Heart failure Maternal Uncle    Hearing loss Son 65       Suicide       Current Outpatient Medications:    acetaminophen (TYLENOL) 500 MG tablet, Take 500 mg by mouth every 6 (six) hours as needed., Disp: , Rfl:    acyclovir (ZOVIRAX) 400 MG tablet, Take 400 mg by mouth 2 (two) times daily., Disp: , Rfl:    allopurinol (ZYLOPRIM) 100 MG tablet, TAKE 1 TABLET(100 MG) BY MOUTH DAILY as needed, Disp: 90 tablet, Rfl: 1   bisoprolol (ZEBETA) 5 MG tablet, Take 5 mg by mouth daily., Disp: , Rfl:    diclofenac sodium (VOLTAREN) 1 % GEL, Apply 2 g topically 4 (four) times daily., Disp: 100 g, Rfl: 1   diphenhydrAMINE (BENADRYL) 50 MG capsule, Take 50 mg by mouth as needed., Disp: , Rfl:    diphenoxylate-atropine (LOMOTIL) 2.5-0.025 MG tablet, Take 2 tablets by mouth 4 (four) times daily as needed for diarrhea or loose stools., Disp: 64 tablet, Rfl: 3   FLUoxetine (PROZAC) 40 MG capsule, TAKE 1 CAPSULE EVERY DAY, Disp: 90 capsule, Rfl: 1   fluticasone (FLONASE) 50 MCG/ACT nasal spray, Place 2 sprays into both nostrils daily., Disp: 16 g, Rfl: 2   gabapentin (NEURONTIN) 300 MG capsule, Take 1 capsule (300 mg total) by mouth 2 (two) times daily., Disp: 60 capsule, Rfl: 0   glucose blood test strip, , Disp: , Rfl:    Multiple Vitamin (MULTIVITAMIN) tablet, Take 1 tablet by mouth daily., Disp: , Rfl:    Multiple Vitamins-Minerals (HAIR/SKIN/NAILS) TABS, Take 1 tablet by mouth daily., Disp: , Rfl:    omeprazole (PRILOSEC) 40 MG capsule, Take 1 capsule (40 mg total) by mouth in the morning and at bedtime., Disp: 180 capsule, Rfl: 1   promethazine (PHENERGAN) 25 MG tablet, Take 1 tablet (25 mg total) by mouth every 6 (six) hours as needed for nausea or vomiting., Disp: 90 tablet, Rfl: 1   tiZANidine (ZANAFLEX) 2 MG tablet, Take 1 tablet (2 mg total) by mouth every 6 (six) hours as needed for muscle spasms., Disp: 30 tablet, Rfl: 1   traZODone (DESYREL) 100 MG tablet, Take 1 tablet (100 mg total) by mouth at bedtime., Disp: 90 tablet, Rfl: 1 No current facility-administered  medications for this visit.  Facility-Administered Medications Ordered in Other Visits:    0.9 %  sodium chloride infusion, , Intravenous, Continuous, Corcoran, Drue Second, MD, Stopped at 01/20/20  1232   0.9 %  sodium chloride infusion, , Intravenous, Continuous, Corcoran, Melissa C, MD, Stopped at 12/19/20 0856   0.9 %  sodium chloride infusion, , Intravenous, Continuous, Earlie Server, MD, Last Rate: 10 mL/hr at 11/30/20 0957, New Bag at 11/30/20 0957   heparin lock flush 100 unit/mL, 500 Units, Intravenous, Once, Burns, Anderson Malta E, NP   heparin lock flush 100 unit/mL, 500 Units, Intravenous, Once, Corcoran, Melissa C, MD   heparin lock flush 100 unit/mL, 500 Units, Intravenous, Once, Earlie Server, MD   heparin lock flush 100 unit/mL, 500 Units, Intravenous, Once, Earlie Server, MD   magnesium sulfate 6 g in sodium chloride 0.9 % 500 mL, 6 g, Intravenous, Once, Earlie Server, MD   promethazine (PHENERGAN) 25 mg in sodium chloride 0.9 % 50 mL IVPB, 25 mg, Intravenous, Once, Earlie Server, MD   sodium chloride flush (NS) 0.9 % injection 10 mL, 10 mL, Intravenous, PRN, Mike Gip, Melissa C, MD, 10 mL at 02/07/20 0815   sodium chloride flush (NS) 0.9 % injection 10 mL, 10 mL, Intracatheter, PRN, Charlaine Dalton R, MD, 10 mL at 02/10/20 0815   sodium chloride flush (NS) 0.9 % injection 10 mL, 10 mL, Intravenous, PRN, Faythe Casa E, NP, 10 mL at 03/01/20 1143   sodium chloride flush (NS) 0.9 % injection 10 mL, 10 mL, Intravenous, PRN, Earlie Server, MD, 10 mL at 01/19/21 1044   sodium chloride flush (NS) 0.9 % injection 10 mL, 10 mL, Intravenous, PRN, Earlie Server, MD, 10 mL at 03/26/21 6384  Physical exam: There were no vitals filed for this visit. Physical Exam Constitutional:      General: She is not in acute distress.    Appearance: She is well-developed. She is not ill-appearing.  HENT:     Head: Atraumatic.     Nose: Nose normal. No congestion or rhinorrhea.     Mouth/Throat:     Pharynx: No oropharyngeal exudate.   Eyes:     General: No scleral icterus.    Conjunctiva/sclera: Conjunctivae normal.  Cardiovascular:     Rate and Rhythm: Normal rate and regular rhythm.  Pulmonary:     Effort: Pulmonary effort is normal.     Breath sounds: Normal breath sounds.     Comments: Frequent cough Abdominal:     General: Bowel sounds are normal. There is no distension.     Palpations: Abdomen is soft.     Tenderness: There is no abdominal tenderness.  Musculoskeletal:        General: No tenderness or deformity. Normal range of motion.     Cervical back: Normal range of motion and neck supple.  Skin:    General: Skin is warm and dry.     Coloration: Skin is not pale.  Neurological:     General: No focal deficit present.     Mental Status: She is alert and oriented to person, place, and time.  Psychiatric:        Mood and Affect: Mood normal.        Behavior: Behavior normal.     CMP Latest Ref Rng & Units 03/26/2021  Glucose 70 - 99 mg/dL 136(H)  BUN 8 - 23 mg/dL 31(H)  Creatinine 0.44 - 1.00 mg/dL 1.11(H)  Sodium 135 - 145 mmol/L 135  Potassium 3.5 - 5.1 mmol/L 3.7  Chloride 98 - 111 mmol/L 104  CO2 22 - 32 mmol/L 22  Calcium 8.9 - 10.3 mg/dL 8.7(L)  Total Protein 6.5 - 8.1 g/dL 7.2  Total Bilirubin 0.3 - 1.2 mg/dL 0.3  Alkaline Phos 38 - 126 U/L 93  AST 15 - 41 U/L 31  ALT 0 - 44 U/L 28   CBC Latest Ref Rng & Units 03/26/2021  WBC 4.0 - 10.5 K/uL 5.9  Hemoglobin 12.0 - 15.0 g/dL 11.2(L)  Hematocrit 36.0 - 46.0 % 34.3(L)  Platelets 150 - 400 K/uL 108(L)    No images are attached to the encounter.  CT ABDOMEN PELVIS W CONTRAST  Result Date: 03/05/2021 CLINICAL DATA:  Provided history of: Appendicitis suspected, intermediate risk, no prior imaging (Ped 0-18y) Technologist notes state: Fever. Intermittent lower abdominal pain. History of multiple myeloma, chronic kidney disease, diabetes, cholecystectomy, hysterectomy, and bladder stimulator. EXAM: CT ABDOMEN AND PELVIS WITH CONTRAST  TECHNIQUE: Multidetector CT imaging of the abdomen and pelvis was performed using the standard protocol following bolus administration of intravenous contrast. CONTRAST:  7m OMNIPAQUE IOHEXOL 350 MG/ML SOLN COMPARISON:  Included portion from PET CT 07/12/2019, abdominopelvic CT 10/16/2016 FINDINGS: Lower chest: Trace pleural thickening at the right lung base with adjacent atelectasis. No basilar pneumonia. Calcified lower paraesophageal nodes are unchanged from prior. Coronary artery calcifications are seen. Hepatobiliary: Chest tube Pancreas: No ductal dilatation or inflammation. Spleen: Normal in size without focal abnormality. Adrenals/Urinary Tract: Normal adrenal glands. Homogeneous renal enhancement. No hydronephrosis or perinephric edema. There may be a punctate nonobstructing stone in the lower left kidney. Cortical low-density in the mid left and medial right kidney are too small to characterize but likely small cysts. Unremarkable urinary bladder. Stomach/Bowel: Decompressed stomach. No small bowel obstruction or inflammation. Normal appendix, series 5, image 34. small to moderate volume of colonic stool. No colonic wall thickening or inflammation. No significant diverticular disease. Vascular/Lymphatic: Moderate aortic atherosclerosis. No aortic aneurysm. Patent portal vein. No portal venous or mesenteric gas. No enlarged lymph nodes in the abdomen or pelvis. Reproductive: Status post hysterectomy. No adnexal masses. Other: No free air or ascites. Presacral stimulator in place with lead tip on the right. No abdominal wall hernia. Musculoskeletal: No blastic or destructive lytic lesions are seen. No acute osseous abnormalities are seen. Mild lumbar spine degenerative change. Remote right tenth rib fracture. IMPRESSION: 1. No acute abnormality or explanation for abdominal pain. Normal appendix. 2. Possible punctate nonobstructing stone in the lower left kidney. Aortic Atherosclerosis (ICD10-I70.0).  Electronically Signed   By: MKeith RakeM.D.   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 Normal heart size, mediastinal contours, and pulmonary vascularity. Mild RIGHT basilar atelectasis with minimal accentuation of interstitial markings versus prior study. Lungs otherwise clear. No definite infiltrate, pleural effusion, or pneumothorax. No acute osseous findings. IMPRESSION: Mild RIGHT basilar atelectasis. Electronically Signed   By: MLavonia DanaM.D.   On: 03/05/2021 14:02   DG HIP UNILAT WITH PELVIS 2-3 VIEWS RIGHT  Result Date: 03/08/2021 CLINICAL DATA:  Acute pelvic pain without known injury. EXAM: DG HIP (WITH OR WITHOUT PELVIS) 2-3V RIGHT COMPARISON:  None. FINDINGS: There is no evidence of hip fracture or dislocation. There is no evidence of arthropathy or other focal bone abnormality. IMPRESSION: Negative. Electronically Signed   By: JMarijo ConceptionM.D.   On: 03/08/2021 14:43    Assessment and plan- Patient is a 65  y.o. female who presents for evaluation of   Cough- Afebrile. Etiology unclear. Chest xray today was independently reviewed: no evidence of pneumonia or infectious etiology. Recommended covid test. Rapid test was negative, she is awiting pcr results. Hold antibiotics or steroids at this time. Recommend supportive care. Will send promethazine dm, 5 ml every 4-6 hours prn for cough.  Hx of CAP s/p hospitalization, cefepime, vancomycin, amoxicillin, augmentin 03/09/21.  Multiple Myeloma- last velcade on 03/19/21.  CHF- chronic chf. No evidence of volume overload. Last echo done 5/21 with EF 60-65%.  CKD- stage IIIa. Labs reviewed and stable Hypomagnesemia- chronic. Ok to review  magnesium supplementation today  RTC if symptoms don't improve or worsen. Follow up with Dr. Tasia Catchings as scheduled.    Visit Diagnosis 1. Cough     Patient expressed understanding and was in agreement with this plan. She also understands that She can call clinic at any time with any questions, concerns, or complaints.   Thank you for allowing me to participate in the care of this very pleasant patient.   Beckey Rutter, DNP, AGNP-[facility] [place]  CC: Dr Tasia Catchings

## 2021-03-26 NOTE — Progress Notes (Signed)
0459: Per Beckey Rutter NP pt to proceed with Magnesium as scheduled. Pt to have chest Xray and Covid test after leaving clinic. Per Beckey Rutter NP okay to proceed with 25 mg IV Phenergan per pt request.

## 2021-03-26 NOTE — Progress Notes (Signed)
Patient here for possible Magnesium infusion today. She reports she is not feeling well. Patient reports having a cough for about one week and says she feels very weak. Patient reports she has not had a fever. She also reports upper back pain and chest soreness from coughing; pain level of 6 on a 0-10 pain scale at this time. She reports she feels worse than when she was in the hospital with pneumonia. Vital signs are stable, see flow sheet. MD, Dr. Tasia Catchings, and NP, Beckey Rutter, notified and aware. Per MD order: NP, Beckey Rutter, will be adding patient on to be seen and evaluated by symptom management today.

## 2021-03-26 NOTE — Patient Instructions (Signed)
Magnesium Sulfate Injection What is this medication? MAGNESIUM SULFATE (mag NEE zee um SUL fate) prevents and treats low levels of magnesium in your body. It may also be used to prevent and treat seizures during pregnancy in people with high blood pressure disorders, such as preeclampsia or eclampsia. Magnesium plays an important role in maintaining the health of your muscles and nervous system. This medicine may be used for other purposes; ask your health care provider or pharmacist if you have questions. What should I tell my care team before I take this medication? They need to know if you have any of these conditions: Heart disease History of irregular heart beat Kidney disease An unusual or allergic reaction to magnesium sulfate, medications, foods, dyes, or preservatives Pregnant or trying to get pregnant Breast-feeding How should I use this medication? This medication is for infusion into a vein. It is given in a hospital or clinic setting. Talk to your care team about the use of this medication in children. While this medication may be prescribed for selected conditions, precautions do apply. Overdosage: If you think you have taken too much of this medicine contact a poison control center or emergency room at once. NOTE: This medicine is only for you. Do not share this medicine with others. What if I miss a dose? This does not apply. What may interact with this medication? Certain medications for anxiety or sleep Certain medications for seizures like phenobarbital Digoxin Medications that relax muscles for surgery Narcotic medications for pain This list may not describe all possible interactions. Give your health care provider a list of all the medicines, herbs, non-prescription drugs, or dietary supplements you use. Also tell them if you smoke, drink alcohol, or use illegal drugs. Some items may interact with your medicine. What should I watch for while using this medication? Your  condition will be monitored carefully while you are receiving this medication. You may need blood work done while you are receiving this medication. What side effects may I notice from receiving this medication? Side effects that you should report to your care team as soon as possible: Allergic reactions-skin rash, itching, hives, swelling of the face, lips, tongue, or throat High magnesium level-confusion, drowsiness, facial flushing, redness, sweating, muscle weakness, fast or irregular heartbeat, trouble breathing Low blood pressure-dizziness, feeling faint or lightheaded, blurry vision Side effects that usually do not require medical attention (report to your care team if they continue or are bothersome): Headache Nausea This list may not describe all possible side effects. Call your doctor for medical advice about side effects. You may report side effects to FDA at 1-800-FDA-1088. Where should I keep my medication? This medication is given in a hospital or clinic and will not be stored at home. NOTE: This sheet is a summary. It may not cover all possible information. If you have questions about this medicine, talk to your doctor, pharmacist, or health care provider.  2022 [employer] (2020-08-07 13:10:26)  

## 2021-03-27 LAB — KAPPA/LAMBDA LIGHT CHAINS
Kappa free light chain: 24.7 mg/L — ABNORMAL HIGH (ref 3.3–19.4)
Kappa, lambda light chain ratio: 1.2 (ref 0.26–1.65)
Lambda free light chains: 20.5 mg/L (ref 5.7–26.3)

## 2021-03-28 ENCOUNTER — Encounter: Payer: Self-pay | Admitting: Internal Medicine

## 2021-03-28 ENCOUNTER — Encounter: Payer: Self-pay | Admitting: Hematology and Oncology

## 2021-03-28 MED ORDER — ACYCLOVIR 400 MG PO TABS: 400.0000 mg | ORAL_TABLET | Freq: Two times a day (BID) | ORAL | 1 refills | Status: DC

## 2021-03-29 LAB — MULTIPLE MYELOMA PANEL, SERUM
Albumin SerPl Elph-Mcnc: 3.9 g/dL (ref 2.9–4.4)
Albumin/Glob SerPl: 1.6 (ref 0.7–1.7)
Alpha 1: 0.2 g/dL (ref 0.0–0.4)
Alpha2 Glob SerPl Elph-Mcnc: 0.9 g/dL (ref 0.4–1.0)
B-Globulin SerPl Elph-Mcnc: 0.8 g/dL (ref 0.7–1.3)
Gamma Glob SerPl Elph-Mcnc: 0.7 g/dL (ref 0.4–1.8)
Globulin, Total: 2.6 g/dL (ref 2.2–3.9)
IgA: 87 mg/dL (ref 87–352)
IgG (Immunoglobin G), Serum: 622 mg/dL (ref 586–1602)
IgM (Immunoglobulin M), Srm: 172 mg/dL (ref 26–217)
Total Protein ELP: 6.5 g/dL (ref 6.0–8.5)

## 2021-03-30 ENCOUNTER — Inpatient Hospital Stay: Payer: Medicare HMO

## 2021-03-30 ENCOUNTER — Other Ambulatory Visit: Payer: Self-pay

## 2021-03-30 VITALS — BP 149/68 | HR 70 | Temp 97.4°F | Resp 17

## 2021-03-30 DIAGNOSIS — M879 Osteonecrosis, unspecified: Secondary | ICD-10-CM | POA: Diagnosis not present

## 2021-03-30 DIAGNOSIS — Z5112 Encounter for antineoplastic immunotherapy: Secondary | ICD-10-CM | POA: Diagnosis not present

## 2021-03-30 DIAGNOSIS — C9110 Chronic lymphocytic leukemia of B-cell type not having achieved remission: Secondary | ICD-10-CM | POA: Diagnosis not present

## 2021-03-30 DIAGNOSIS — N183 Chronic kidney disease, stage 3 unspecified: Secondary | ICD-10-CM | POA: Diagnosis not present

## 2021-03-30 DIAGNOSIS — E538 Deficiency of other specified B group vitamins: Secondary | ICD-10-CM | POA: Diagnosis not present

## 2021-03-30 DIAGNOSIS — E1122 Type 2 diabetes mellitus with diabetic chronic kidney disease: Secondary | ICD-10-CM | POA: Diagnosis not present

## 2021-03-30 DIAGNOSIS — R197 Diarrhea, unspecified: Secondary | ICD-10-CM | POA: Diagnosis not present

## 2021-03-30 DIAGNOSIS — M549 Dorsalgia, unspecified: Secondary | ICD-10-CM | POA: Diagnosis not present

## 2021-03-30 DIAGNOSIS — C9000 Multiple myeloma not having achieved remission: Secondary | ICD-10-CM

## 2021-03-30 DIAGNOSIS — I350 Nonrheumatic aortic (valve) stenosis: Secondary | ICD-10-CM | POA: Diagnosis not present

## 2021-03-30 LAB — MAGNESIUM: Magnesium: 1.3 mg/dL — ABNORMAL LOW (ref 1.7–2.4)

## 2021-03-30 MED ORDER — HEPARIN SOD (PORK) LOCK FLUSH 100 UNIT/ML IV SOLN
500.0000 [IU] | Freq: Once | INTRAVENOUS | Status: AC
  Administered 2021-03-30: 500 [IU] via INTRAVENOUS
  Filled 2021-03-30: qty 5

## 2021-03-30 MED ORDER — SODIUM CHLORIDE 0.9% FLUSH
10.0000 mL | INTRAVENOUS | Status: DC | PRN
  Administered 2021-03-30: 10 mL via INTRAVENOUS
  Filled 2021-03-30: qty 10

## 2021-03-30 MED ORDER — MAGNESIUM SULFATE 2 GM/50ML IV SOLN
2.0000 g | Freq: Once | INTRAVENOUS | Status: AC
  Administered 2021-03-30: 2 g via INTRAVENOUS
  Filled 2021-03-30: qty 50

## 2021-03-30 MED ORDER — SODIUM CHLORIDE 0.9 % IV SOLN: 6.0000 g | Freq: Once | INTRAVENOUS | Status: DC

## 2021-03-30 MED ORDER — MAGNESIUM SULFATE 4 GM/100ML IV SOLN
4.0000 g | Freq: Once | INTRAVENOUS | Status: AC
  Administered 2021-03-30: 4 g via INTRAVENOUS
  Filled 2021-03-30: qty 100

## 2021-03-30 MED ORDER — SODIUM CHLORIDE 0.9 % IV SOLN
Freq: Once | INTRAVENOUS | Status: AC
Start: 2021-03-30 — End: 2021-03-30
  Filled 2021-03-30: qty 250

## 2021-03-30 MED ORDER — SODIUM CHLORIDE 0.9 % IV SOLN
25.0000 mg | Freq: Once | INTRAVENOUS | Status: AC
  Administered 2021-03-30: 25 mg via INTRAVENOUS
  Filled 2021-03-30: qty 1

## 2021-03-30 MED ORDER — SODIUM CHLORIDE 0.9 % IV SOLN: 4.0000 g | Freq: Once | INTRAVENOUS | Status: DC

## 2021-03-30 MED ORDER — MAGNESIUM SULFATE 2 GM/50ML IV SOLN: 2.0000 g | Freq: Once | INTRAVENOUS | Status: DC

## 2021-03-30 NOTE — Patient Instructions (Signed)
Magnesium Sulfate Injection What is this medication? MAGNESIUM SULFATE (mag NEE zee um SUL fate) prevents and treats low levels of magnesium in your body. It may also be used to prevent and treat seizures during pregnancy in people with high blood pressure disorders, such as preeclampsia or eclampsia. Magnesium plays an important role in maintaining the health of your muscles and nervous system. This medicine may be used for other purposes; ask your health care provider or pharmacist if you have questions. What should I tell my care team before I take this medication? They need to know if you have any of these conditions: Heart disease History of irregular heart beat Kidney disease An unusual or allergic reaction to magnesium sulfate, medications, foods, dyes, or preservatives Pregnant or trying to get pregnant Breast-feeding How should I use this medication? This medication is for infusion into a vein. It is given in a hospital or clinic setting. Talk to your care team about the use of this medication in children. While this medication may be prescribed for selected conditions, precautions do apply. Overdosage: If you think you have taken too much of this medicine contact a poison control center or emergency room at once. NOTE: This medicine is only for you. Do not share this medicine with others. What if I miss a dose? This does not apply. What may interact with this medication? Certain medications for anxiety or sleep Certain medications for seizures like phenobarbital Digoxin Medications that relax muscles for surgery Narcotic medications for pain This list may not describe all possible interactions. Give your health care provider a list of all the medicines, herbs, non-prescription drugs, or dietary supplements you use. Also tell them if you smoke, drink alcohol, or use illegal drugs. Some items may interact with your medicine. What should I watch for while using this medication? Your  condition will be monitored carefully while you are receiving this medication. You may need blood work done while you are receiving this medication. What side effects may I notice from receiving this medication? Side effects that you should report to your care team as soon as possible: Allergic reactions-skin rash, itching, hives, swelling of the face, lips, tongue, or throat High magnesium level-confusion, drowsiness, facial flushing, redness, sweating, muscle weakness, fast or irregular heartbeat, trouble breathing Low blood pressure-dizziness, feeling faint or lightheaded, blurry vision Side effects that usually do not require medical attention (report to your care team if they continue or are bothersome): Headache Nausea This list may not describe all possible side effects. Call your doctor for medical advice about side effects. You may report side effects to FDA at 1-800-FDA-1088. Where should I keep my medication? This medication is given in a hospital or clinic and will not be stored at home. NOTE: This sheet is a summary. It may not cover all possible information. If you have questions about this medicine, talk to your doctor, pharmacist, or health care provider.  2022 [employer] (2020-08-07 13:10:26)  

## 2021-03-30 NOTE — Progress Notes (Signed)
Pt received 6 grams of Magnesium over 3 hours via IV port. Received IV phenergan as well. Tolerated meds. Discharged to home. VSS.

## 2021-04-02 ENCOUNTER — Other Ambulatory Visit: Payer: Self-pay

## 2021-04-02 ENCOUNTER — Inpatient Hospital Stay: Payer: Medicare HMO

## 2021-04-02 ENCOUNTER — Ambulatory Visit
Admission: RE | Admit: 2021-04-02 | Discharge: 2021-04-02 | Disposition: A | Discharge status: Home / Self Care | Payer: Medicare HMO | Source: Home / Self Care | Attending: Internal Medicine | Admitting: Internal Medicine

## 2021-04-02 ENCOUNTER — Inpatient Hospital Stay: Payer: Medicare HMO | Attending: Oncology

## 2021-04-02 ENCOUNTER — Inpatient Hospital Stay (HOSPITAL_BASED_OUTPATIENT_CLINIC_OR_DEPARTMENT_OTHER): Payer: Medicare HMO | Admitting: Oncology

## 2021-04-02 ENCOUNTER — Ambulatory Visit
Admission: RE | Admit: 2021-04-02 | Discharge: 2021-04-02 | Disposition: A | Discharge status: Home / Self Care | Payer: Medicare HMO | Source: Ambulatory Visit | Attending: Oncology | Admitting: Oncology

## 2021-04-02 ENCOUNTER — Encounter: Payer: Self-pay | Admitting: Oncology

## 2021-04-02 VITALS — BP 135/83 | HR 91 | Temp 97.5°F | Resp 18 | Wt 155.6 lb

## 2021-04-02 DIAGNOSIS — I350 Nonrheumatic aortic (valve) stenosis: Secondary | ICD-10-CM | POA: Diagnosis not present

## 2021-04-02 DIAGNOSIS — M7989 Other specified soft tissue disorders: Secondary | ICD-10-CM | POA: Insufficient documentation

## 2021-04-02 DIAGNOSIS — C9000 Multiple myeloma not having achieved remission: Secondary | ICD-10-CM | POA: Diagnosis not present

## 2021-04-02 DIAGNOSIS — N179 Acute kidney failure, unspecified: Secondary | ICD-10-CM | POA: Insufficient documentation

## 2021-04-02 DIAGNOSIS — R110 Nausea: Secondary | ICD-10-CM

## 2021-04-02 DIAGNOSIS — E1122 Type 2 diabetes mellitus with diabetic chronic kidney disease: Secondary | ICD-10-CM | POA: Insufficient documentation

## 2021-04-02 DIAGNOSIS — R0789 Other chest pain: Secondary | ICD-10-CM

## 2021-04-02 DIAGNOSIS — G629 Polyneuropathy, unspecified: Secondary | ICD-10-CM | POA: Insufficient documentation

## 2021-04-02 DIAGNOSIS — R059 Cough, unspecified: Secondary | ICD-10-CM | POA: Diagnosis not present

## 2021-04-02 DIAGNOSIS — R0600 Dyspnea, unspecified: Secondary | ICD-10-CM | POA: Insufficient documentation

## 2021-04-02 DIAGNOSIS — Z841 Family history of disorders of kidney and ureter: Secondary | ICD-10-CM | POA: Insufficient documentation

## 2021-04-02 DIAGNOSIS — R771 Abnormality of globulin: Secondary | ICD-10-CM | POA: Diagnosis not present

## 2021-04-02 DIAGNOSIS — Z801 Family history of malignant neoplasm of trachea, bronchus and lung: Secondary | ICD-10-CM | POA: Insufficient documentation

## 2021-04-02 DIAGNOSIS — Z808 Family history of malignant neoplasm of other organs or systems: Secondary | ICD-10-CM | POA: Insufficient documentation

## 2021-04-02 DIAGNOSIS — M2550 Pain in unspecified joint: Secondary | ICD-10-CM | POA: Diagnosis not present

## 2021-04-02 DIAGNOSIS — Z886 Allergy status to analgesic agent status: Secondary | ICD-10-CM | POA: Insufficient documentation

## 2021-04-02 DIAGNOSIS — Z8379 Family history of other diseases of the digestive system: Secondary | ICD-10-CM | POA: Insufficient documentation

## 2021-04-02 DIAGNOSIS — Z87891 Personal history of nicotine dependence: Secondary | ICD-10-CM | POA: Insufficient documentation

## 2021-04-02 DIAGNOSIS — D709 Neutropenia, unspecified: Secondary | ICD-10-CM | POA: Diagnosis not present

## 2021-04-02 DIAGNOSIS — R339 Retention of urine, unspecified: Secondary | ICD-10-CM | POA: Diagnosis not present

## 2021-04-02 DIAGNOSIS — R1310 Dysphagia, unspecified: Secondary | ICD-10-CM | POA: Insufficient documentation

## 2021-04-02 DIAGNOSIS — Z800 Family history of malignant neoplasm of digestive organs: Secondary | ICD-10-CM | POA: Insufficient documentation

## 2021-04-02 DIAGNOSIS — T451X5A Adverse effect of antineoplastic and immunosuppressive drugs, initial encounter: Secondary | ICD-10-CM

## 2021-04-02 DIAGNOSIS — Z9221 Personal history of antineoplastic chemotherapy: Secondary | ICD-10-CM | POA: Insufficient documentation

## 2021-04-02 DIAGNOSIS — M549 Dorsalgia, unspecified: Secondary | ICD-10-CM | POA: Insufficient documentation

## 2021-04-02 DIAGNOSIS — Z5111 Encounter for antineoplastic chemotherapy: Secondary | ICD-10-CM

## 2021-04-02 DIAGNOSIS — E538 Deficiency of other specified B group vitamins: Secondary | ICD-10-CM | POA: Diagnosis not present

## 2021-04-02 DIAGNOSIS — Z634 Disappearance and death of family member: Secondary | ICD-10-CM | POA: Insufficient documentation

## 2021-04-02 DIAGNOSIS — M79604 Pain in right leg: Secondary | ICD-10-CM | POA: Diagnosis not present

## 2021-04-02 DIAGNOSIS — F32A Depression, unspecified: Secondary | ICD-10-CM | POA: Insufficient documentation

## 2021-04-02 DIAGNOSIS — G620 Drug-induced polyneuropathy: Secondary | ICD-10-CM

## 2021-04-02 DIAGNOSIS — R197 Diarrhea, unspecified: Secondary | ICD-10-CM | POA: Insufficient documentation

## 2021-04-02 DIAGNOSIS — D509 Iron deficiency anemia, unspecified: Secondary | ICD-10-CM | POA: Insufficient documentation

## 2021-04-02 DIAGNOSIS — Z79899 Other long term (current) drug therapy: Secondary | ICD-10-CM | POA: Insufficient documentation

## 2021-04-02 DIAGNOSIS — Z803 Family history of malignant neoplasm of breast: Secondary | ICD-10-CM | POA: Insufficient documentation

## 2021-04-02 DIAGNOSIS — Z5112 Encounter for antineoplastic immunotherapy: Secondary | ICD-10-CM | POA: Insufficient documentation

## 2021-04-02 DIAGNOSIS — B952 Enterococcus as the cause of diseases classified elsewhere: Secondary | ICD-10-CM | POA: Diagnosis not present

## 2021-04-02 DIAGNOSIS — Z888 Allergy status to other drugs, medicaments and biological substances status: Secondary | ICD-10-CM | POA: Insufficient documentation

## 2021-04-02 DIAGNOSIS — Z8249 Family history of ischemic heart disease and other diseases of the circulatory system: Secondary | ICD-10-CM | POA: Insufficient documentation

## 2021-04-02 DIAGNOSIS — R0602 Shortness of breath: Secondary | ICD-10-CM | POA: Diagnosis not present

## 2021-04-02 DIAGNOSIS — Z832 Family history of diseases of the blood and blood-forming organs and certain disorders involving the immune mechanism: Secondary | ICD-10-CM | POA: Insufficient documentation

## 2021-04-02 DIAGNOSIS — N1831 Chronic kidney disease, stage 3a: Secondary | ICD-10-CM | POA: Diagnosis not present

## 2021-04-02 DIAGNOSIS — Z9049 Acquired absence of other specified parts of digestive tract: Secondary | ICD-10-CM | POA: Insufficient documentation

## 2021-04-02 DIAGNOSIS — Z885 Allergy status to narcotic agent status: Secondary | ICD-10-CM | POA: Insufficient documentation

## 2021-04-02 DIAGNOSIS — D801 Nonfamilial hypogammaglobulinemia: Secondary | ICD-10-CM | POA: Insufficient documentation

## 2021-04-02 DIAGNOSIS — Z833 Family history of diabetes mellitus: Secondary | ICD-10-CM | POA: Insufficient documentation

## 2021-04-02 DIAGNOSIS — I129 Hypertensive chronic kidney disease with stage 1 through stage 4 chronic kidney disease, or unspecified chronic kidney disease: Secondary | ICD-10-CM | POA: Insufficient documentation

## 2021-04-02 DIAGNOSIS — Z822 Family history of deafness and hearing loss: Secondary | ICD-10-CM | POA: Insufficient documentation

## 2021-04-02 DIAGNOSIS — Z9484 Stem cells transplant status: Secondary | ICD-10-CM | POA: Insufficient documentation

## 2021-04-02 DIAGNOSIS — D649 Anemia, unspecified: Secondary | ICD-10-CM

## 2021-04-02 DIAGNOSIS — Z9481 Bone marrow transplant status: Secondary | ICD-10-CM | POA: Insufficient documentation

## 2021-04-02 LAB — COMPREHENSIVE METABOLIC PANEL
ALT: 35 U/L (ref 0–44)
AST: 37 U/L (ref 15–41)
Albumin: 4.5 g/dL (ref 3.5–5.0)
Alkaline Phosphatase: 84 U/L (ref 38–126)
Anion gap: 12 (ref 5–15)
BUN: 27 mg/dL — ABNORMAL HIGH (ref 8–23)
CO2: 24 mmol/L (ref 22–32)
Calcium: 9.6 mg/dL (ref 8.9–10.3)
Chloride: 99 mmol/L (ref 98–111)
Creatinine, Ser: 1.07 mg/dL — ABNORMAL HIGH (ref 0.44–1.00)
GFR, Estimated: 58 mL/min — ABNORMAL LOW (ref >60)
Glucose, Bld: 160 mg/dL — ABNORMAL HIGH (ref 70–99)
Potassium: 3.9 mmol/L (ref 3.5–5.1)
Sodium: 135 mmol/L (ref 135–145)
Total Bilirubin: 0.2 mg/dL — ABNORMAL LOW (ref 0.3–1.2)
Total Protein: 7.6 g/dL (ref 6.5–8.1)

## 2021-04-02 LAB — CBC WITH DIFFERENTIAL/PLATELET
Abs Immature Granulocytes: 0.03 10*3/uL (ref 0.00–0.07)
Basophils Absolute: 0 10*3/uL (ref 0.0–0.1)
Basophils Relative: 1 %
Eosinophils Absolute: 0.2 10*3/uL (ref 0.0–0.5)
Eosinophils Relative: 4 %
HCT: 36.2 % (ref 36.0–46.0)
Hemoglobin: 11.8 g/dL — ABNORMAL LOW (ref 12.0–15.0)
Immature Granulocytes: 1 %
Lymphocytes Relative: 17 %
Lymphs Abs: 1 10*3/uL (ref 0.7–4.0)
MCH: 27.8 pg (ref 26.0–34.0)
MCHC: 32.6 g/dL (ref 30.0–36.0)
MCV: 85.4 fL (ref 80.0–100.0)
Monocytes Absolute: 0.5 10*3/uL (ref 0.1–1.0)
Monocytes Relative: 9 %
Neutro Abs: 4 10*3/uL (ref 1.7–7.7)
Neutrophils Relative %: 68 %
Platelets: 115 10*3/uL — ABNORMAL LOW (ref 150–400)
RBC: 4.24 MIL/uL (ref 3.87–5.11)
RDW: 15.6 % — ABNORMAL HIGH (ref 11.5–15.5)
WBC: 5.7 10*3/uL (ref 4.0–10.5)
nRBC: 0 % (ref 0.0–0.2)

## 2021-04-02 LAB — MAGNESIUM: Magnesium: 1.1 mg/dL — ABNORMAL LOW (ref 1.7–2.4)

## 2021-04-02 MED ORDER — MAGNESIUM SULFATE 2 GM/50ML IV SOLN
2.0000 g | Freq: Once | INTRAVENOUS | Status: AC
  Administered 2021-04-02: 2 g via INTRAVENOUS
  Filled 2021-04-02: qty 50

## 2021-04-02 MED ORDER — SODIUM CHLORIDE 0.9 % IV SOLN: 6.0000 g | Freq: Once | INTRAVENOUS | Status: DC

## 2021-04-02 MED ORDER — MAGNESIUM SULFATE 4 GM/100ML IV SOLN
4.0000 g | Freq: Once | INTRAVENOUS | Status: AC
  Administered 2021-04-02: 4 g via INTRAVENOUS
  Filled 2021-04-02: qty 100

## 2021-04-02 MED ORDER — SODIUM CHLORIDE 0.9% FLUSH
10.0000 mL | INTRAVENOUS | Status: AC | PRN
  Administered 2021-04-02: 10 mL via INTRAVENOUS
  Filled 2021-04-02: qty 10

## 2021-04-02 MED ORDER — HEPARIN SOD (PORK) LOCK FLUSH 100 UNIT/ML IV SOLN
500.0000 [IU] | Freq: Once | INTRAVENOUS | Status: AC
  Administered 2021-04-02: 500 [IU] via INTRAVENOUS
  Filled 2021-04-02: qty 5

## 2021-04-02 NOTE — Addendum Note (Signed)
Addended by: Earlie Server on: 04/02/2021 03:45 PM   Modules accepted: Orders

## 2021-04-02 NOTE — Progress Notes (Signed)
Patient here for follow up. Pt reports that cough has calmed down but she gets short of breath and chest discomfort with minimal activity.

## 2021-04-02 NOTE — Progress Notes (Addendum)
Hematology Oncology Progress Note  Clinic Day:  04/02/2021   Referring physician: Glean Hess, MD  Chief Complaint: Sarah [name] is a 66 y.o. 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   Initial bone marrow revealed 80% plasma cells.  Lambda free light chains were 1340.  She had nephrotic range proteinuria.  She initially underwent induction with RVD.  Revlimid maintenance was discontinued on 01/21/2017 secondary to intolerance.     07/19/2019 Bone marrow aspirate and biopsy on  revealed a normocellular marrow with but increased lambda-restricted plasma cells (9% aspirate, 40% CD138 immunohistochemistry).  Findings were consistent with recurrent plasma cell myeloma.  Flow cytometry revealed no monoclonal B-cell or phenotypically aberrant T-cell population. Cytogenetics were 71, XX (normal).  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    Lambda light chains have been followed: 22.2 (ratio 0.56) on 07/03/2017, 30.8 (ratio 0.78) on 09/02/2017, 36.9 (ratio 0.40) on 10/21/2017, 37.4 (ratio 0.41) on 12/16/2017, 70.7(ratio 0.31)  on 02/17/2018, 64.2 (ratio 0.27) on 04/07/2018, 78.9 (ratio 0.18) on 05/26/2018, 128.8 (ratio 0.17) on 08/06/2018, 181.5 (ratio 0.13) on 10/08/2018, 130.9 (ratio 0.13) on 10/20/2018, 160.7 (ratio 0.10) on 12/09/2018, 236.6 (ratio 0.07) on 02/01/2019,  363.6 (ratio 0.04) on 03/22/2019, 404.8 (ratio 0.04) on 04/05/2019, 420.7 (ratio 0.03) on 05/24/2019, 573.4 (ratio 0.03) on 06/23/2019, 451.05 (ratio 0.02) on 08/20/2019, 47.2 (ratio 0.13) on 10/25/2019, 22.4 (ratio 0.21) on 11/25/2019, 16.5 (ratio 0.33) on 01/10/2020, 14.6 (ratio 0.34) on 02/07/2020, 13.1 (ratio 0.31) on 03/07/2020, 10.1 (ratio 0.38) on 04/10/2020, and 9.5 (ratio 0.21) on 06/19/2020.   24 hour UPEP on 06/03/2019 revealed kappa free light chains 95.76, lambda free light chains 1,260.71, and ratio 0.08.  24 hour UPEP on 08/23/2019 revealed total protein of 782 mg/24 hrs with lambda free light chains 1,084.16 mg/L and ratio of 0.10 (1.03-31.76).  M spike in urine was 46.1% (361 mg/24 hrs).    Bone survey on 04/08/2016 and 05/28/2017 revealed no definite lytic lesion seen in the visualized skeleton.  Bone survey on 11/19/2018 revealed no suspicious lucent lesions and no acute bony abnormality.  PET scan on 07/12/2019 revealed no focal metabolic activity to suggest active myeloma within the skeleton. There were no lytic lesions identified on the CT portion of the exam or soft tissue plasmacytomas. There was no evidence of multiple myeloma.    Pretreatment RBC phenotype on 09/23/2019 was positive for C, e, DUFFY B, KIDD B, M, S, and s antigen; negative for c, E, KELL, DUFFY A, KIDD A, and N antigen.    09/27/2019 - 10/25/2019; 12/09/2019 - 03/13/2020 6 cycles of daratumumab and hyaluronidase-fihj, Pomalyst, and Decadron (DPd) .  Cycle #1 was complicated by fever and neutropenia requiring admission.  Cycle #2 was complicated by pneumonia requiring admission.  Cycle #6 was complicated with an ER evaluation for an elevated lactic acid.   05/10/2020 second autologous stem cell transplant at [facility]   She underwent conditioning with melphalan 140  mg/m2.    She is s/p week #3 Velcade maintenance (began 08/31/2020 - 09/28/2020).  She has no increase in neuropathy.  She has severe aortic stenosis.   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 Cardiology is following for a possible TAVR in the future.  Echo on 07/05/2020 revealed moderate to severe aortic stenosis with an EF of 50-55%.  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 Zometa was discontinued in 01/2017.  She has chronic nausea on Phenergan.     B12 deficiency.  B12 was 254 on 04/09/2017, 295 on 08/20/2018, and 391 on 10/08/2018.  She was on oral B12.  She received B12 monthly (last 09/14/2020).  Folate was 12.1 on 01/10/2020.    She has iron deficiency.  Ferritin was 32 on 07/01/2019.  She received Venofer on 07/15/2019 and 07/22/2019.   She has hypogammaglobulinemia.  IgG was 245 on 11/25/2019.  She received monthly IVIG (07/22/021 - 03/22/2020).  She received IVIG 400 mg/kg on 01/20/2020, 200 mg/kg on 02/17/2020, and 300 mg/kg on 03/22/2020.  IVIG on 16/04/9603 was complicated by acute renal failure. IgG trough level was 418 on 02/17/2020.  10/15/2019 - 10/20/2019 She was admitted to [facility] with fever and neutropenia.  Cultures were negative.  CXR was negative. She received broad spectrum antibiotics and daily Granix.  She received IVF for acute renal insufficiency due to diarrhea and dehydration.  Creatine was 1.66 on admission and 1.12 on discharge.    03/01/2020 - 03/05/2020 admitted to [facility] from with fever and neutropenia. CXR revealed no active cardiopulmonary disease. Chest CT with contrast revealed no acute intrathoracic pathology. There were findings which could be suggestive of prior granulomatous disease. She was treated with Cefepime and Vancomycin, then switched to ciprofloxacin on 03/03/2020.   05/08/2020 - 12/05/2021The patient was admitted to [facility] from  for autologous bone marrow transplant.   She received conditioning with melphalan 140 mg/m2.  Bone marrow transplant was on 05/10/2020. The patient developed fevers daily from  05/19/2020 - 05/24/2020. Blood cultures were + for strept sanguis bacteremia.  She was treated cefepime, vancomycin, and solumedrol for possible engraftment syndrome. Cefepime was switched to ceftriaxone; she completed 2 weeks of ceftriaxone on 06/03/2020. She had chemotherapy induced diarrhea.  She experienced urinary retention.  Feb 2022 patient has been on maintenance Velcade 2 mg every 2 weeks.  Chronic diarrhea seen by gastroenterology Dr. Vicente Males and was recommended to avoid artificial sugars in her diet including Diet Coke and coffee mate.  She feels some improvement of her diarrhea frequency since the dietary modification.  GI profile PCR negative, C. difficile toxin A+ B negative, fecal calprotectin 77 12/19/2020 colonoscopy showed 2 subcentimeter polyps in the ascending colon, resected and removed.  Pathology showed tubular adenoma.  No malignancy.  Normal mucosa in the entire examined colon.  Biopsied, pathology showed colonic mucosa with no significant pathological alteration.  Negative for active inflammation and features of chronicity.  Negative for microscopic colitis, dysplasia, and malignanc  Diabetes, patient has discontinued metformin due to diarrhea and started on glipizide 2.5 mg   03/05/2021-03/09/2021 Patient was hospitalized due to fever and chills, nonproductive cough, shortness of breath. X-ray showed right lower lobe atelectasis versus pneumonia.  Blood cultures negative.  Urine culture showed 60,000 colonies of Enterococcus facialis.  Patient received antibiotics. Patient also had abdominal pelvis CT scan for work-up of intermittent lower abdomen pain.No acute abnormality.   INTERVAL HISTORY  Amnah Breuer is a 65 y.o. female who has above history reviewed by me today presents for follow up visit for management of multiple myeloma.   She was seen by NP for nasal congestion, productive cough, no fever or chills. CXR showed no acute process. She also feels tired and weak.    Today she continues to feel week. Also reports cough, SOB with exertion and posterior chest discomfort worsen with deep breathing.   Chronic diarrhea has been better.  Chronic magnesium, she gets IV magnesium twice weekly with each visit.        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   Due to Zometa   Cardiomyopathy, idiopathic ([place]) 08/21/2017   Overview:  Septal and apical hypokinesis with ef 40%   CHF (congestive heart failure) (HCC)    CKD (chronic kidney disease) stage 3, GFR 30-59 ml/min (HCC)    Depression    Diabetes mellitus (HCC)    Dizziness    Fatty liver    Frequent falls    GERD (gastroesophageal reflux disease)    Gout    Heart murmur    History of blood transfusion    History of bone marrow transplant ([place])    History of uterine fibroid    Hx of cardiac catheterization 06/05/2016   Overview:  Normal coronaries 2017   Hypertension    Hypomagnesemia    IDA (iron deficiency anemia)    Multiple myeloma ([place])    Personal history of chemotherapy    Renal cyst    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[place]) 06/05/2016   Overview:  Sp ablation 1999    Past Surgical History:  Procedure Laterality Date   ABDOMINAL HYSTERECTOMY     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   CARDIAC ELECTROPHYSIOLOGY MAPPING AND ABLATION     CARPAL TUNNEL RELEASE Bilateral    CHOLECYSTECTOMY  2008   COLONOSCOPY WITH PROPOFOL N/A 05/07/2017   Procedure: COLONOSCOPY WITH PROPOFOL;  Surgeon: [name], MD;  Location: [facility] ENDOSCOPY;  Service: Gastroenterology;  Laterality: N/A;   COLONOSCOPY WITH PROPOFOL N/A 12/19/2020   Procedure: COLONOSCOPY WITH PROPOFOL;  Surgeon: [name], MD;  Location: [facility] ENDOSCOPY;  Service: Gastroenterology;  Laterality: N/A;    ESOPHAGOGASTRODUODENOSCOPY (EGD) WITH PROPOFOL N/A 05/07/2017   Procedure: ESOPHAGOGASTRODUODENOSCOPY (EGD) WITH PROPOFOL;  Surgeon: [name], MD;  Location: [facility] ENDOSCOPY;  Service: Gastroenterology;  Laterality: N/A;   FOOT SURGERY Bilateral    INCONTINENCE SURGERY  2009   INTERSTIM IMPLANT PLACEMENT     other     over active bladder   OTHER SURGICAL HISTORY     bladder stimulator    PARTIAL HYSTERECTOMY  03/1996   fibroids   PORTA CATH INSERTION N/A 03/10/2019   Procedure: PORTA CATH INSERTION;  Surgeon: Algernon Huxley, MD;  Location: [place] CV LAB;  Service: Cardiovascular;  Laterality: N/A;   TONSILLECTOMY  2007    Family History  Problem Relation Age of Onset   Colon cancer Father    Renal Disease Father    Diabetes Mellitus II Father    Melanoma Paternal Grandmother    Breast cancer Maternal Aunt 67   Anemia Mother    Heart disease Mother    Heart failure Mother    Renal Disease  Mother    Congestive Heart Failure Mother    Heart disease Maternal Uncle    Throat cancer Maternal Uncle    Lung cancer Maternal Uncle    Liver disease Maternal Uncle    Heart failure Maternal Uncle    Hearing loss Son 86       Suicide     Social History:  reports that she quit smoking about 29 years ago. Her smoking use included cigarettes. She has a 20.00 pack-year smoking history. She has never used smokeless tobacco. She reports current alcohol use. She reports that she does not use drugs.  She is on disability. She notes exposure to perchloroethylene [facility]).  She lives much of her adult life in [place].   Her husband passed away.    Allergies:  Allergies  Allergen Reactions   Oxycodone-Acetaminophen Anaphylaxis    Swelling and rash   Celebrex [Celecoxib] Diarrhea   Codeine    Plerixafor     In 2016 during ASCT collection patient developed fever to 103.46F and required hospitalization   Benadryl [Diphenhydramine] Palpitations   Morphine Itching and Rash    Ondansetron Diarrhea   Tylenol [Acetaminophen] Itching and Rash    Current Medications: Current Outpatient Medications  Medication Sig Dispense Refill   acetaminophen (TYLENOL) 500 MG tablet Take 500 mg by mouth every 6 (six) hours as needed.     acyclovir (ZOVIRAX) 400 MG tablet Take 1 tablet (400 mg total) by mouth 2 (two) times daily. 180 tablet 1   bisoprolol (ZEBETA) 5 MG tablet Take 5 mg by mouth daily.     FLUoxetine (PROZAC) 40 MG capsule TAKE 1 CAPSULE EVERY DAY 90 capsule 0   gabapentin (NEURONTIN) 300 MG capsule Take 1 capsule (300 mg total) by mouth 2 (two) times daily. 60 capsule 0   glucose blood test strip      Multiple Vitamin (MULTIVITAMIN) tablet Take 1 tablet by mouth daily.     omeprazole (PRILOSEC) 40 MG capsule Take 1 capsule (40 mg total) by mouth in the morning and at bedtime. 180 capsule 1   promethazine (PHENERGAN) 25 MG tablet Take 1 tablet (25 mg total) by mouth every 6 (six) hours as needed for nausea or vomiting. 90 tablet 1   promethazine-dextromethorphan (PROMETHAZINE-DM) 6.25-15 MG/5ML syrup Take 5 mLs by mouth 4 (four) times daily as needed for cough. 180 mL 0   tiZANidine (ZANAFLEX) 2 MG tablet Take 1 tablet (2 mg total) by mouth every 6 (six) hours as needed for muscle spasms. 30 tablet 1   traZODone (DESYREL) 100 MG tablet Take 1 tablet (100 mg total) by mouth at bedtime. 90 tablet 1   allopurinol (ZYLOPRIM) 100 MG tablet TAKE 1 TABLET(100 MG) BY MOUTH DAILY as needed (Patient not taking: Reported on 04/02/2021) 90 tablet 1   diclofenac sodium (VOLTAREN) 1 % GEL Apply 2 g topically 4 (four) times daily. (Patient not taking: Reported on 04/02/2021) 100 g 1   diphenhydrAMINE (BENADRYL) 50 MG capsule Take 50 mg by mouth as needed. (Patient not taking: Reported on 04/02/2021)     diphenoxylate-atropine (LOMOTIL) 2.5-0.025 MG tablet Take 2 tablets by mouth 4 (four) times daily as needed for diarrhea or loose stools. (Patient not taking: Reported on 04/02/2021) 64  tablet 3   fluticasone (FLONASE) 50 MCG/ACT nasal spray Place 2 sprays into both nostrils daily. (Patient not taking: Reported on 04/02/2021) 16 g 2   Multiple Vitamins-Minerals (HAIR/SKIN/NAILS) TABS Take 1 tablet by mouth daily. (Patient not taking: Reported  on 04/02/2021)     No current facility-administered medications for this visit.   Facility-Administered Medications Ordered in Other Visits  Medication Dose Route Frequency Provider Last Rate Last Admin   sodium chloride flush (NS) 0.9 % injection 10 mL  10 mL Intravenous PRN Earlie Server, MD   10 mL at 04/02/21 7741    Review of Systems  Constitutional:  Negative for chills, fever, malaise/fatigue and weight loss.  HENT:  Negative for sore throat.   Eyes:  Negative for redness.  Respiratory:  Negative for cough, shortness of breath and wheezing.        Chest discomfort  Cardiovascular:  Negative for chest pain, palpitations and leg swelling.  Gastrointestinal:  Positive for diarrhea and nausea. Negative for abdominal pain, blood in stool and vomiting.  Genitourinary:  Negative for dysuria.  Musculoskeletal:  Positive for back pain and joint pain. Negative for myalgias.  Skin:  Negative for rash.  Neurological:  Positive for tingling and sensory change. Negative for dizziness and tremors.  Endo/Heme/Allergies:  Does not bruise/bleed easily.  Psychiatric/Behavioral:  Negative for hallucinations.    Performance status (ECOG): 1  Vitals Blood pressure 135/83, pulse 91, temperature (!) 97.5 F (36.4 C), resp. rate 18, weight 155 lb 9.6 oz (70.6 kg), SpO2 97 %.  Physical Exam Vitals and nursing note reviewed.  Constitutional:      General: She is not in acute distress.    Appearance: She is well-developed. She is not ill-appearing, toxic-appearing or diaphoretic.     Interventions: Face mask in place.  HENT:     Head: Normocephalic and atraumatic.     Mouth/Throat:     Mouth: Mucous membranes are moist.     Pharynx: Oropharynx is  clear.  Eyes:     General: No scleral icterus.    Pupils: Pupils are equal, round, and reactive to light.  Cardiovascular:     Rate and Rhythm: Normal rate and regular rhythm.     Heart sounds: Normal heart sounds. No murmur heard. Pulmonary:     Effort: Pulmonary effort is normal. No respiratory distress.     Breath sounds: Normal breath sounds. No wheezing or rales.  Chest:     Chest wall: No tenderness.  Abdominal:     General: Bowel sounds are normal. There is no distension.     Palpations: Abdomen is soft. There is no hepatomegaly, splenomegaly or mass.     Tenderness: There is no abdominal tenderness. There is no guarding or rebound.  Musculoskeletal:        General: No tenderness. Normal range of motion.     Cervical back: Normal range of motion and neck supple.     Right lower leg: No edema.     Left lower leg: No edema.  Lymphadenopathy:     Head:     Right side of head: No preauricular, posterior auricular or occipital adenopathy.     Left side of head: No preauricular, posterior auricular or occipital adenopathy.     Cervical: No cervical adenopathy.     Upper Body:     Right upper body: No supraclavicular adenopathy.     Left upper body: No supraclavicular adenopathy.  Skin:    General: Skin is warm and dry.  Neurological:     Mental Status: She is alert and oriented to person, place, and time. Mental status is at baseline.  Psychiatric:        Mood and Affect: Mood normal.   Laboratory data Clinical  Support on 04/02/2021  Component Date Value Ref Range Status   Magnesium 04/02/2021 1.1 (A) 1.7 - 2.4 mg/dL Final   Performed at [facility], [place], [place] [address]   Sodium 04/02/2021 135  135 - 145 mmol/L Final   Potassium 04/02/2021 3.9  3.5 - 5.1 mmol/L Final   Chloride 04/02/2021 99  98 - 111 mmol/L Final   CO2 04/02/2021 24  22 - 32 mmol/L Final   Glucose, Bld 04/02/2021 160 (A) 70 - 99 mg/dL Final   Glucose reference range  applies only to samples taken after fasting for at least 8 hours.   BUN 04/02/2021 27 (A) 8 - 23 mg/dL Final   Creatinine, Ser 04/02/2021 1.07 (A) 0.44 - 1.00 mg/dL Final   Calcium 04/02/2021 9.6  8.9 - 10.3 mg/dL Final   Total Protein 04/02/2021 7.6  6.5 - 8.1 g/dL Final   Albumin 04/02/2021 4.5  3.5 - 5.0 g/dL Final   AST 04/02/2021 37  15 - 41 U/L Final   ALT 04/02/2021 35  0 - 44 U/L Final   Alkaline Phosphatase 04/02/2021 84  38 - 126 U/L Final   Total Bilirubin 04/02/2021 0.2 (A) 0.3 - 1.2 mg/dL Final   GFR, Estimated 04/02/2021 58 (A) >60 mL/min Final   Comment: (NOTE) Calculated using the CKD-EPI Creatinine Equation (2021)    Anion gap 04/02/2021 12  5 - 15 Final   Performed at [facility], [place], [place] [address]   WBC 04/02/2021 5.7  4.0 - 10.5 K/uL Final   RBC 04/02/2021 4.24  3.87 - 5.11 MIL/uL Final   Hemoglobin 04/02/2021 11.8 (A) 12.0 - 15.0 g/dL Final   HCT 04/02/2021 36.2  36.0 - 46.0 % Final   MCV 04/02/2021 85.4  80.0 - 100.0 fL Final   MCH 04/02/2021 27.8  26.0 - 34.0 pg Final   MCHC 04/02/2021 32.6  30.0 - 36.0 g/dL Final   RDW 04/02/2021 15.6 (A) 11.5 - 15.5 % Final   Platelets 04/02/2021 115 (A) 150 - 400 K/uL Final   nRBC 04/02/2021 0.0  0.0 - 0.2 % Final   Neutrophils Relative % 04/02/2021 68  % Final   Neutro Abs 04/02/2021 4.0  1.7 - 7.7 K/uL Final   Lymphocytes Relative 04/02/2021 17  % Final   Lymphs Abs 04/02/2021 1.0  0.7 - 4.0 K/uL Final   Monocytes Relative 04/02/2021 9  % Final   Monocytes Absolute 04/02/2021 0.5  0.1 - 1.0 K/uL Final   Eosinophils Relative 04/02/2021 4  % Final   Eosinophils Absolute 04/02/2021 0.2  0.0 - 0.5 K/uL Final   Basophils Relative 04/02/2021 1  % Final   Basophils Absolute 04/02/2021 0.0  0.0 - 0.1 K/uL Final   Immature Granulocytes 04/02/2021 1  % Final   Abs Immature Granulocytes 04/02/2021 0.03  0.00 - 0.07 K/uL Final   Performed at [facility], [address] [place],  [place] [address]    Assessment/Plan 1. Cough, unspecified type   2. Dyspnea, unspecified type   3. Neuropathy   4. Chest discomfort   5. B12 deficiency     # Recurrent lambda light chain multiple myeloma s/p second autologous bone marrow transplant [05/10/2020] Labs are reviewed and discussed with patient. Hold Velcade due to acute issue. See below.  multiple myeloma panel, light chain ratio are stable.   Continue prophylaxis with Valtrex up to 1 year after transplant-November 2022 Vaccination at [place] transplant  RN coordinator [phone]).  # cough, SOB with exertion, pruritic chest discomfort, recent hospitalization.  Check STAT bilateral lower extremity US and VQ scan. If negative, plan to treat empirically with Azythromycin for possible bronchitis.   #Hyperglobulinemia post stem cell transplantation.  May consider  IVIG replacement therapy if she has frequent infections. 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   She agrees with trying gabapentin. Start with 376m daily, titrate to 3026mBID if needed.   # iron deficiency anemia, Hemoglobin 11.8  #chronic diarrhea, Gastroenterology recommendation was reviewed.  Avoid artificial sugar.  Colonoscopy showed no microscopic colitis. Her symptom seems to have improved slightly after dietary modification. Velcade side effects may also contribute to diarrhea  #Chronic hypomagnesemia Magnesium is 1.1 today, proceed with  IV magnesium today. Off Slow-Mag 64 mg twice daily.  Per GI recommendation as this potentially precipitate her diarrhea. continue magnesium checked twice a week +/- IV magnesium  #Dysphagia, chronic nausea, patient request to have IV Phenergan in the clinic with treatment.    # B12 deficiency, patient has been on vitamin B12 monthly. Last dose 11/09/2020. Vitamin B12 is 404 Will resume B12 injections.    RTC: 1 week, lab magnesium/IV magnesium twice per week,  RTC in 2 weeks for labs  (CBC with diff, CMP, Mg ), md  for  Velcade and IV Mg + B12 injection.   I discussed the assessment and treatment plan with the patient.  The patient was provided an opportunity to ask questions and all were answered.  The patient agreed with the plan and demonstrated an understanding of the instructions.  The patient was advised to call back if the symptoms worsen or if the condition fails to improve as anticipated.   ZhEarlie ServerMD, PhD 04/02/2021

## 2021-04-03 ENCOUNTER — Other Ambulatory Visit: Payer: Medicare HMO

## 2021-04-03 ENCOUNTER — Ambulatory Visit
Admission: RE | Admit: 2021-04-03 | Discharge: 2021-04-03 | Disposition: A | Discharge status: Home / Self Care | Payer: Medicare HMO | Source: Ambulatory Visit | Attending: Oncology | Admitting: Oncology

## 2021-04-03 ENCOUNTER — Ambulatory Visit: Admission: RE | Admit: 2021-04-03 | Payer: Medicare HMO | Source: Ambulatory Visit

## 2021-04-03 DIAGNOSIS — R0600 Dyspnea, unspecified: Secondary | ICD-10-CM | POA: Diagnosis not present

## 2021-04-03 DIAGNOSIS — R059 Cough, unspecified: Secondary | ICD-10-CM

## 2021-04-03 DIAGNOSIS — R0602 Shortness of breath: Secondary | ICD-10-CM | POA: Diagnosis not present

## 2021-04-03 LAB — KAPPA/LAMBDA LIGHT CHAINS
Kappa free light chain: 24.4 mg/L — ABNORMAL HIGH (ref 3.3–19.4)
Kappa, lambda light chain ratio: 1.31 (ref 0.26–1.65)
Lambda free light chains: 18.6 mg/L (ref 5.7–26.3)

## 2021-04-03 MED ORDER — TECHNETIUM TO 99M ALBUMIN AGGREGATED
4.0000 | Freq: Once | INTRAVENOUS | Status: AC
  Administered 2021-04-03: 4.4 via INTRAVENOUS

## 2021-04-05 ENCOUNTER — Other Ambulatory Visit: Payer: Self-pay | Admitting: Oncology

## 2021-04-05 ENCOUNTER — Inpatient Hospital Stay: Payer: Medicare HMO

## 2021-04-05 ENCOUNTER — Telehealth: Payer: Self-pay

## 2021-04-05 ENCOUNTER — Other Ambulatory Visit: Payer: Self-pay | Admitting: *Deleted

## 2021-04-05 DIAGNOSIS — C9000 Multiple myeloma not having achieved remission: Secondary | ICD-10-CM

## 2021-04-05 DIAGNOSIS — N1831 Chronic kidney disease, stage 3a: Secondary | ICD-10-CM | POA: Diagnosis not present

## 2021-04-05 DIAGNOSIS — E538 Deficiency of other specified B group vitamins: Secondary | ICD-10-CM | POA: Diagnosis not present

## 2021-04-05 DIAGNOSIS — I129 Hypertensive chronic kidney disease with stage 1 through stage 4 chronic kidney disease, or unspecified chronic kidney disease: Secondary | ICD-10-CM | POA: Diagnosis not present

## 2021-04-05 DIAGNOSIS — D801 Nonfamilial hypogammaglobulinemia: Secondary | ICD-10-CM | POA: Diagnosis not present

## 2021-04-05 DIAGNOSIS — R197 Diarrhea, unspecified: Secondary | ICD-10-CM | POA: Diagnosis not present

## 2021-04-05 DIAGNOSIS — F32A Depression, unspecified: Secondary | ICD-10-CM | POA: Diagnosis not present

## 2021-04-05 DIAGNOSIS — Z5112 Encounter for antineoplastic immunotherapy: Secondary | ICD-10-CM | POA: Diagnosis not present

## 2021-04-05 DIAGNOSIS — E1122 Type 2 diabetes mellitus with diabetic chronic kidney disease: Secondary | ICD-10-CM | POA: Diagnosis not present

## 2021-04-05 LAB — MAGNESIUM: Magnesium: 1.5 mg/dL — ABNORMAL LOW (ref 1.7–2.4)

## 2021-04-05 MED ORDER — SODIUM CHLORIDE 0.9 % IV SOLN: 4.0000 g | Freq: Once | INTRAVENOUS | Status: DC

## 2021-04-05 MED ORDER — SODIUM CHLORIDE 0.9 % IV SOLN
25.0000 mg | Freq: Once | INTRAVENOUS | Status: AC
  Administered 2021-04-05: 25 mg via INTRAVENOUS
  Filled 2021-04-05: qty 1

## 2021-04-05 MED ORDER — MAGNESIUM SULFATE 4 GM/100ML IV SOLN
4.0000 g | Freq: Once | INTRAVENOUS | Status: AC
  Administered 2021-04-05: 4 g via INTRAVENOUS
  Filled 2021-04-05: qty 100

## 2021-04-05 MED ORDER — SODIUM CHLORIDE 0.9% FLUSH
10.0000 mL | INTRAVENOUS | Status: DC | PRN
  Administered 2021-04-05: 10 mL via INTRAVENOUS
  Filled 2021-04-05: qty 10

## 2021-04-05 MED ORDER — SODIUM CHLORIDE 0.9 % IV SOLN
Freq: Once | INTRAVENOUS | Status: AC
  Filled 2021-04-05: qty 250

## 2021-04-05 MED ORDER — PROMETHAZINE-DM 6.25-15 MG/5ML PO SYRP: 5.0000 mL | ORAL_SOLUTION | Freq: Four times a day (QID) | ORAL | 0 refills | Status: DC | PRN

## 2021-04-05 MED ORDER — AZITHROMYCIN 250 MG PO TABS: ORAL_TABLET | ORAL | 0 refills | Status: DC

## 2021-04-05 MED ORDER — HEPARIN SOD (PORK) LOCK FLUSH 100 UNIT/ML IV SOLN
500.0000 [IU] | Freq: Once | INTRAVENOUS | Status: AC
  Administered 2021-04-05: 500 [IU] via INTRAVENOUS
  Filled 2021-04-05: qty 5

## 2021-04-05 MED ORDER — CYANOCOBALAMIN 1000 MCG/ML IJ SOLN
1000.0000 ug | Freq: Once | INTRAMUSCULAR | Status: AC
  Administered 2021-04-05: 1000 ug via INTRAMUSCULAR
  Filled 2021-04-05: qty 1

## 2021-04-05 NOTE — Patient Instructions (Signed)
Magnesium Sulfate Injection What is this medication? MAGNESIUM SULFATE (mag NEE zee um SUL fate) prevents and treats low levels of magnesium in your body. It may also be used to prevent and treat seizures during pregnancy in people with high blood pressure disorders, such as preeclampsia or eclampsia. Magnesium plays an important role in maintaining the health of your muscles and nervous system. This medicine may be used for other purposes; ask your health care provider or pharmacist if you have questions. What should I tell my care team before I take this medication? They need to know if you have any of these conditions: Heart disease History of irregular heart beat Kidney disease An unusual or allergic reaction to magnesium sulfate, medications, foods, dyes, or preservatives Pregnant or trying to get pregnant Breast-feeding How should I use this medication? This medication is for infusion into a vein. It is given in a hospital or clinic setting. Talk to your care team about the use of this medication in children. While this medication may be prescribed for selected conditions, precautions do apply. Overdosage: If you think you have taken too much of this medicine contact a poison control center or emergency room at once. NOTE: This medicine is only for you. Do not share this medicine with others. What if I miss a dose? This does not apply. What may interact with this medication? Certain medications for anxiety or sleep Certain medications for seizures like phenobarbital Digoxin Medications that relax muscles for surgery Narcotic medications for pain This list may not describe all possible interactions. Give your health care provider a list of all the medicines, herbs, non-prescription drugs, or dietary supplements you use. Also tell them if you smoke, drink alcohol, or use illegal drugs. Some items may interact with your medicine. What should I watch for while using this medication? Your  condition will be monitored carefully while you are receiving this medication. You may need blood work done while you are receiving this medication. What side effects may I notice from receiving this medication? Side effects that you should report to your care team as soon as possible: Allergic reactions-skin rash, itching, hives, swelling of the face, lips, tongue, or throat High magnesium level-confusion, drowsiness, facial flushing, redness, sweating, muscle weakness, fast or irregular heartbeat, trouble breathing Low blood pressure-dizziness, feeling faint or lightheaded, blurry vision Side effects that usually do not require medical attention (report to your care team if they continue or are bothersome): Headache Nausea This list may not describe all possible side effects. Call your doctor for medical advice about side effects. You may report side effects to FDA at 1-800-FDA-1088. Where should I keep my medication? This medication is given in a hospital or clinic and will not be stored at home. NOTE: This sheet is a summary. It may not cover all possible information. If you have questions about this medicine, talk to your doctor, pharmacist, or health care provider.  2022 [employer] (2020-08-07 13:10:26)  

## 2021-04-05 NOTE — Progress Notes (Signed)
Magnesium 1.5 today. 4 grams magnesium IV given per MD parameters.

## 2021-04-09 ENCOUNTER — Other Ambulatory Visit: Payer: Self-pay

## 2021-04-09 ENCOUNTER — Inpatient Hospital Stay: Payer: Medicare HMO

## 2021-04-09 VITALS — BP 143/73 | HR 70 | Temp 97.0°F | Resp 18

## 2021-04-09 DIAGNOSIS — I129 Hypertensive chronic kidney disease with stage 1 through stage 4 chronic kidney disease, or unspecified chronic kidney disease: Secondary | ICD-10-CM | POA: Diagnosis not present

## 2021-04-09 DIAGNOSIS — Z5112 Encounter for antineoplastic immunotherapy: Secondary | ICD-10-CM | POA: Diagnosis not present

## 2021-04-09 DIAGNOSIS — F32A Depression, unspecified: Secondary | ICD-10-CM | POA: Diagnosis not present

## 2021-04-09 DIAGNOSIS — C9000 Multiple myeloma not having achieved remission: Secondary | ICD-10-CM

## 2021-04-09 DIAGNOSIS — R197 Diarrhea, unspecified: Secondary | ICD-10-CM | POA: Diagnosis not present

## 2021-04-09 DIAGNOSIS — E1122 Type 2 diabetes mellitus with diabetic chronic kidney disease: Secondary | ICD-10-CM | POA: Diagnosis not present

## 2021-04-09 DIAGNOSIS — E538 Deficiency of other specified B group vitamins: Secondary | ICD-10-CM | POA: Diagnosis not present

## 2021-04-09 DIAGNOSIS — D801 Nonfamilial hypogammaglobulinemia: Secondary | ICD-10-CM | POA: Diagnosis not present

## 2021-04-09 DIAGNOSIS — N1831 Chronic kidney disease, stage 3a: Secondary | ICD-10-CM | POA: Diagnosis not present

## 2021-04-09 LAB — CBC WITH DIFFERENTIAL/PLATELET
Abs Immature Granulocytes: 0.02 10*3/uL (ref 0.00–0.07)
Basophils Absolute: 0 10*3/uL (ref 0.0–0.1)
Basophils Relative: 0 %
Eosinophils Absolute: 0.3 10*3/uL (ref 0.0–0.5)
Eosinophils Relative: 4 %
HCT: 35.4 % — ABNORMAL LOW (ref 36.0–46.0)
Hemoglobin: 11.5 g/dL — ABNORMAL LOW (ref 12.0–15.0)
Immature Granulocytes: 0 %
Lymphocytes Relative: 16 %
Lymphs Abs: 1.1 10*3/uL (ref 0.7–4.0)
MCH: 27.5 pg (ref 26.0–34.0)
MCHC: 32.5 g/dL (ref 30.0–36.0)
MCV: 84.7 fL (ref 80.0–100.0)
Monocytes Absolute: 0.5 10*3/uL (ref 0.1–1.0)
Monocytes Relative: 8 %
Neutro Abs: 4.9 10*3/uL (ref 1.7–7.7)
Neutrophils Relative %: 72 %
Platelets: 124 10*3/uL — ABNORMAL LOW (ref 150–400)
RBC: 4.18 MIL/uL (ref 3.87–5.11)
RDW: 15.1 % (ref 11.5–15.5)
WBC: 6.8 10*3/uL (ref 4.0–10.5)
nRBC: 0 % (ref 0.0–0.2)

## 2021-04-09 LAB — MULTIPLE MYELOMA PANEL, SERUM
Albumin SerPl Elph-Mcnc: 3.8 g/dL (ref 2.9–4.4)
Albumin/Glob SerPl: 1.3 (ref 0.7–1.7)
Alpha 1: 0.2 g/dL (ref 0.0–0.4)
Alpha2 Glob SerPl Elph-Mcnc: 1 g/dL (ref 0.4–1.0)
B-Globulin SerPl Elph-Mcnc: 0.9 g/dL (ref 0.7–1.3)
Gamma Glob SerPl Elph-Mcnc: 0.8 g/dL (ref 0.4–1.8)
Globulin, Total: 3 g/dL (ref 2.2–3.9)
IgA: 74 mg/dL — ABNORMAL LOW (ref 87–352)
IgG (Immunoglobin G), Serum: 642 mg/dL (ref 586–1602)
IgM (Immunoglobulin M), Srm: 146 mg/dL (ref 26–217)
Total Protein ELP: 6.8 g/dL (ref 6.0–8.5)

## 2021-04-09 LAB — COMPREHENSIVE METABOLIC PANEL
ALT: 42 U/L (ref 0–44)
AST: 36 U/L (ref 15–41)
Albumin: 4.2 g/dL (ref 3.5–5.0)
Alkaline Phosphatase: 87 U/L (ref 38–126)
Anion gap: 8 (ref 5–15)
BUN: 30 mg/dL — ABNORMAL HIGH (ref 8–23)
CO2: 24 mmol/L (ref 22–32)
Calcium: 9 mg/dL (ref 8.9–10.3)
Chloride: 102 mmol/L (ref 98–111)
Creatinine, Ser: 1.08 mg/dL — ABNORMAL HIGH (ref 0.44–1.00)
GFR, Estimated: 57 mL/min — ABNORMAL LOW (ref >60)
Glucose, Bld: 149 mg/dL — ABNORMAL HIGH (ref 70–99)
Potassium: 4.2 mmol/L (ref 3.5–5.1)
Sodium: 134 mmol/L — ABNORMAL LOW (ref 135–145)
Total Bilirubin: 0.2 mg/dL — ABNORMAL LOW (ref 0.3–1.2)
Total Protein: 7.6 g/dL (ref 6.5–8.1)

## 2021-04-09 LAB — MAGNESIUM: Magnesium: 1.2 mg/dL — ABNORMAL LOW (ref 1.7–2.4)

## 2021-04-09 MED ORDER — MAGNESIUM SULFATE 2 GM/50ML IV SOLN
2.0000 g | Freq: Once | INTRAVENOUS | Status: AC
  Administered 2021-04-09: 2 g via INTRAVENOUS
  Filled 2021-04-09: qty 50

## 2021-04-09 MED ORDER — MAGNESIUM SULFATE 4 GM/100ML IV SOLN
4.0000 g | Freq: Once | INTRAVENOUS | Status: AC
  Administered 2021-04-09: 4 g via INTRAVENOUS
  Filled 2021-04-09: qty 100

## 2021-04-09 MED ORDER — SODIUM CHLORIDE 0.9% FLUSH
10.0000 mL | Freq: Once | INTRAVENOUS | Status: AC
  Administered 2021-04-09: 10 mL via INTRAVENOUS
  Filled 2021-04-09: qty 10

## 2021-04-09 MED ORDER — SODIUM CHLORIDE 0.9 % IV SOLN
Freq: Once | INTRAVENOUS | Status: AC
  Filled 2021-04-09: qty 250

## 2021-04-09 MED ORDER — HEPARIN SOD (PORK) LOCK FLUSH 100 UNIT/ML IV SOLN
INTRAVENOUS | Status: AC
  Administered 2021-04-09: 500 [IU] via INTRAVENOUS
  Filled 2021-04-09: qty 5

## 2021-04-09 MED ORDER — SODIUM CHLORIDE 0.9 % IV SOLN
25.0000 mg | Freq: Once | INTRAVENOUS | Status: AC
  Administered 2021-04-09: 25 mg via INTRAVENOUS
  Filled 2021-04-09: qty 1

## 2021-04-09 MED ORDER — SODIUM CHLORIDE 0.9 % IV SOLN: 6.0000 g | Freq: Once | INTRAVENOUS | Status: DC

## 2021-04-09 MED ORDER — HEPARIN SOD (PORK) LOCK FLUSH 100 UNIT/ML IV SOLN
500.0000 [IU] | Freq: Once | INTRAVENOUS | Status: AC
  Filled 2021-04-09: qty 5

## 2021-04-09 NOTE — Patient Instructions (Signed)
Magnesium Sulfate Injection What is this medication? MAGNESIUM SULFATE (mag NEE zee um SUL fate) prevents and treats low levels of magnesium in your body. It may also be used to prevent and treat seizures during pregnancy in people with high blood pressure disorders, such as preeclampsia or eclampsia. Magnesium plays an important role in maintaining the health of your muscles and nervous system. This medicine may be used for other purposes; ask your health care provider or pharmacist if you have questions. What should I tell my care team before I take this medication? They need to know if you have any of these conditions: Heart disease History of irregular heart beat Kidney disease An unusual or allergic reaction to magnesium sulfate, medications, foods, dyes, or preservatives Pregnant or trying to get pregnant Breast-feeding How should I use this medication? This medication is for infusion into a vein. It is given in a hospital or clinic setting. Talk to your care team about the use of this medication in children. While this medication may be prescribed for selected conditions, precautions do apply. Overdosage: If you think you have taken too much of this medicine contact a poison control center or emergency room at once. NOTE: This medicine is only for you. Do not share this medicine with others. What if I miss a dose? This does not apply. What may interact with this medication? Certain medications for anxiety or sleep Certain medications for seizures like phenobarbital Digoxin Medications that relax muscles for surgery Narcotic medications for pain This list may not describe all possible interactions. Give your health care provider a list of all the medicines, herbs, non-prescription drugs, or dietary supplements you use. Also tell them if you smoke, drink alcohol, or use illegal drugs. Some items may interact with your medicine. What should I watch for while using this medication? Your  condition will be monitored carefully while you are receiving this medication. You may need blood work done while you are receiving this medication. What side effects may I notice from receiving this medication? Side effects that you should report to your care team as soon as possible: Allergic reactions-skin rash, itching, hives, swelling of the face, lips, tongue, or throat High magnesium level-confusion, drowsiness, facial flushing, redness, sweating, muscle weakness, fast or irregular heartbeat, trouble breathing Low blood pressure-dizziness, feeling faint or lightheaded, blurry vision Side effects that usually do not require medical attention (report to your care team if they continue or are bothersome): Headache Nausea This list may not describe all possible side effects. Call your doctor for medical advice about side effects. You may report side effects to FDA at 1-800-FDA-1088. Where should I keep my medication? This medication is given in a hospital or clinic and will not be stored at home. NOTE: This sheet is a summary. It may not cover all possible information. If you have questions about this medicine, talk to your doctor, pharmacist, or health care provider.  2022 [employer] (2020-08-07 13:10:26)  

## 2021-04-10 DIAGNOSIS — I10 Essential (primary) hypertension: Secondary | ICD-10-CM | POA: Diagnosis not present

## 2021-04-10 DIAGNOSIS — N1831 Chronic kidney disease, stage 3a: Secondary | ICD-10-CM | POA: Diagnosis not present

## 2021-04-10 DIAGNOSIS — E1122 Type 2 diabetes mellitus with diabetic chronic kidney disease: Secondary | ICD-10-CM | POA: Diagnosis not present

## 2021-04-10 DIAGNOSIS — R809 Proteinuria, unspecified: Secondary | ICD-10-CM | POA: Diagnosis not present

## 2021-04-13 ENCOUNTER — Inpatient Hospital Stay: Payer: Medicare HMO

## 2021-04-13 ENCOUNTER — Other Ambulatory Visit: Payer: Self-pay

## 2021-04-13 DIAGNOSIS — C9000 Multiple myeloma not having achieved remission: Secondary | ICD-10-CM | POA: Diagnosis not present

## 2021-04-13 DIAGNOSIS — R197 Diarrhea, unspecified: Secondary | ICD-10-CM | POA: Diagnosis not present

## 2021-04-13 DIAGNOSIS — I129 Hypertensive chronic kidney disease with stage 1 through stage 4 chronic kidney disease, or unspecified chronic kidney disease: Secondary | ICD-10-CM | POA: Diagnosis not present

## 2021-04-13 DIAGNOSIS — Z5112 Encounter for antineoplastic immunotherapy: Secondary | ICD-10-CM | POA: Diagnosis not present

## 2021-04-13 DIAGNOSIS — F32A Depression, unspecified: Secondary | ICD-10-CM | POA: Diagnosis not present

## 2021-04-13 DIAGNOSIS — E1122 Type 2 diabetes mellitus with diabetic chronic kidney disease: Secondary | ICD-10-CM | POA: Diagnosis not present

## 2021-04-13 DIAGNOSIS — D801 Nonfamilial hypogammaglobulinemia: Secondary | ICD-10-CM | POA: Diagnosis not present

## 2021-04-13 DIAGNOSIS — E538 Deficiency of other specified B group vitamins: Secondary | ICD-10-CM | POA: Diagnosis not present

## 2021-04-13 DIAGNOSIS — N1831 Chronic kidney disease, stage 3a: Secondary | ICD-10-CM | POA: Diagnosis not present

## 2021-04-13 LAB — MAGNESIUM: Magnesium: 1.3 mg/dL — ABNORMAL LOW (ref 1.7–2.4)

## 2021-04-13 MED ORDER — HEPARIN SOD (PORK) LOCK FLUSH 100 UNIT/ML IV SOLN
500.0000 [IU] | Freq: Once | INTRAVENOUS | Status: AC
  Administered 2021-04-13: 500 [IU] via INTRAVENOUS
  Filled 2021-04-13: qty 5

## 2021-04-13 MED ORDER — MAGNESIUM SULFATE 4 GM/100ML IV SOLN
4.0000 g | Freq: Once | INTRAVENOUS | Status: AC
  Administered 2021-04-13: 4 g via INTRAVENOUS
  Filled 2021-04-13: qty 100

## 2021-04-13 MED ORDER — SODIUM CHLORIDE 0.9 % IV SOLN: 6.0000 g | Freq: Once | INTRAVENOUS | Status: DC

## 2021-04-13 MED ORDER — SODIUM CHLORIDE 0.9% FLUSH
10.0000 mL | INTRAVENOUS | Status: DC | PRN
  Administered 2021-04-13: 10 mL via INTRAVENOUS
  Filled 2021-04-13: qty 10

## 2021-04-13 MED ORDER — SODIUM CHLORIDE 0.9 % IV SOLN
Freq: Once | INTRAVENOUS | Status: AC
  Filled 2021-04-13: qty 250

## 2021-04-13 MED ORDER — SODIUM CHLORIDE 0.9 % IV SOLN
25.0000 mg | Freq: Once | INTRAVENOUS | Status: AC
  Administered 2021-04-13: 25 mg via INTRAVENOUS
  Filled 2021-04-13: qty 1

## 2021-04-13 MED ORDER — MAGNESIUM SULFATE 2 GM/50ML IV SOLN
2.0000 g | Freq: Once | INTRAVENOUS | Status: AC
  Administered 2021-04-13: 2 g via INTRAVENOUS
  Filled 2021-04-13: qty 50

## 2021-04-13 NOTE — Patient Instructions (Signed)
Magnesium Sulfate Injection What is this medication? MAGNESIUM SULFATE (mag NEE zee um SUL fate) prevents and treats low levels of magnesium in your body. It may also be used to prevent and treat seizures during pregnancy in people with high blood pressure disorders, such as preeclampsia or eclampsia. Magnesium plays an important role in maintaining the health of your muscles and nervous system. This medicine may be used for other purposes; ask your health care provider or pharmacist if you have questions. What should I tell my care team before I take this medication? They need to know if you have any of these conditions: Heart disease History of irregular heart beat Kidney disease An unusual or allergic reaction to magnesium sulfate, medications, foods, dyes, or preservatives Pregnant or trying to get pregnant Breast-feeding How should I use this medication? This medication is for infusion into a vein. It is given in a hospital or clinic setting. Talk to your care team about the use of this medication in children. While this medication may be prescribed for selected conditions, precautions do apply. Overdosage: If you think you have taken too much of this medicine contact a poison control center or emergency room at once. NOTE: This medicine is only for you. Do not share this medicine with others. What if I miss a dose? This does not apply. What may interact with this medication? Certain medications for anxiety or sleep Certain medications for seizures like phenobarbital Digoxin Medications that relax muscles for surgery Narcotic medications for pain This list may not describe all possible interactions. Give your health care provider a list of all the medicines, herbs, non-prescription drugs, or dietary supplements you use. Also tell them if you smoke, drink alcohol, or use illegal drugs. Some items may interact with your medicine. What should I watch for while using this medication? Your  condition will be monitored carefully while you are receiving this medication. You may need blood work done while you are receiving this medication. What side effects may I notice from receiving this medication? Side effects that you should report to your care team as soon as possible: Allergic reactions-skin rash, itching, hives, swelling of the face, lips, tongue, or throat High magnesium level-confusion, drowsiness, facial flushing, redness, sweating, muscle weakness, fast or irregular heartbeat, trouble breathing Low blood pressure-dizziness, feeling faint or lightheaded, blurry vision Side effects that usually do not require medical attention (report to your care team if they continue or are bothersome): Headache Nausea This list may not describe all possible side effects. Call your doctor for medical advice about side effects. You may report side effects to FDA at 1-800-FDA-1088. Where should I keep my medication? This medication is given in a hospital or clinic and will not be stored at home. NOTE: This sheet is a summary. It may not cover all possible information. If you have questions about this medicine, talk to your doctor, pharmacist, or health care provider.  2022 [employer] (2020-08-07 13:10:26)  

## 2021-04-15 ENCOUNTER — Other Ambulatory Visit: Payer: Self-pay | Admitting: Internal Medicine

## 2021-04-15 DIAGNOSIS — M25551 Pain in right hip: Secondary | ICD-10-CM

## 2021-04-15 NOTE — Telephone Encounter (Signed)
Requested medication (s) are due for refill today: yes  Requested medication (s) are on the active medication list: yes  Last refill:  03/13/21 #30 1 RF  Future visit scheduled: yes  Notes to clinic:  med not delegated to NT to RF   Requested Prescriptions  Pending Prescriptions Disp Refills   tiZANidine (ZANAFLEX) 2 MG tablet [Pharmacy Med Name: TIZANIDINE 2MG  TABLETS] 30 tablet 1    Sig: TAKE 1 TABLET(2 MG) BY MOUTH EVERY 6 HOURS AS NEEDED FOR MUSCLE SPASMS     Not Delegated - Cardiovascular:  Alpha-2 Agonists - tizanidine Failed - 04/15/2021 10:41 AM      Failed - This refill cannot be delegated      Passed - Valid encounter within last 6 months    Recent Outpatient Visits           1 month ago Pneumonia of right lower lobe due to infectious organism   [facility] Glean Hess, MD   4 months ago Annual physical exam   [facility] Glean Hess, MD   1 year ago Benign essential HTN   [facility] Glean Hess, MD   1 year ago Annual physical exam   [facility] Glean Hess, MD   1 year ago Benign essential HTN   [facility] Glean Hess, MD       Future Appointments             In 2 days [name], MD [place]   In 1 week Army Melia Jesse Sans, MD [facility] [place]   In 7 months Army Melia, Jesse Sans, MD [facility]

## 2021-04-16 ENCOUNTER — Ambulatory Visit: Payer: Medicare HMO

## 2021-04-16 ENCOUNTER — Encounter: Payer: Self-pay | Admitting: Oncology

## 2021-04-16 ENCOUNTER — Other Ambulatory Visit: Payer: Self-pay

## 2021-04-16 ENCOUNTER — Inpatient Hospital Stay (HOSPITAL_BASED_OUTPATIENT_CLINIC_OR_DEPARTMENT_OTHER): Payer: Medicare HMO | Admitting: Oncology

## 2021-04-16 ENCOUNTER — Inpatient Hospital Stay: Payer: Medicare HMO

## 2021-04-16 ENCOUNTER — Other Ambulatory Visit: Payer: Medicare HMO

## 2021-04-16 VITALS — BP 131/82 | HR 69 | Temp 97.6°F | Resp 18 | Wt 158.5 lb

## 2021-04-16 DIAGNOSIS — G620 Drug-induced polyneuropathy: Secondary | ICD-10-CM | POA: Diagnosis not present

## 2021-04-16 DIAGNOSIS — R059 Cough, unspecified: Secondary | ICD-10-CM | POA: Diagnosis not present

## 2021-04-16 DIAGNOSIS — Z5111 Encounter for antineoplastic chemotherapy: Secondary | ICD-10-CM | POA: Diagnosis not present

## 2021-04-16 DIAGNOSIS — E538 Deficiency of other specified B group vitamins: Secondary | ICD-10-CM

## 2021-04-16 DIAGNOSIS — Z9484 Stem cells transplant status: Secondary | ICD-10-CM | POA: Diagnosis not present

## 2021-04-16 DIAGNOSIS — C9001 Multiple myeloma in remission: Secondary | ICD-10-CM

## 2021-04-16 DIAGNOSIS — R197 Diarrhea, unspecified: Secondary | ICD-10-CM | POA: Diagnosis not present

## 2021-04-16 DIAGNOSIS — Z95828 Presence of other vascular implants and grafts: Secondary | ICD-10-CM

## 2021-04-16 DIAGNOSIS — N1831 Chronic kidney disease, stage 3a: Secondary | ICD-10-CM | POA: Diagnosis not present

## 2021-04-16 DIAGNOSIS — F32A Depression, unspecified: Secondary | ICD-10-CM | POA: Diagnosis not present

## 2021-04-16 DIAGNOSIS — E1122 Type 2 diabetes mellitus with diabetic chronic kidney disease: Secondary | ICD-10-CM | POA: Diagnosis not present

## 2021-04-16 DIAGNOSIS — T451X5A Adverse effect of antineoplastic and immunosuppressive drugs, initial encounter: Secondary | ICD-10-CM | POA: Diagnosis not present

## 2021-04-16 DIAGNOSIS — G629 Polyneuropathy, unspecified: Secondary | ICD-10-CM | POA: Diagnosis not present

## 2021-04-16 DIAGNOSIS — I129 Hypertensive chronic kidney disease with stage 1 through stage 4 chronic kidney disease, or unspecified chronic kidney disease: Secondary | ICD-10-CM | POA: Diagnosis not present

## 2021-04-16 DIAGNOSIS — Z5112 Encounter for antineoplastic immunotherapy: Secondary | ICD-10-CM | POA: Diagnosis not present

## 2021-04-16 DIAGNOSIS — D801 Nonfamilial hypogammaglobulinemia: Secondary | ICD-10-CM | POA: Diagnosis not present

## 2021-04-16 DIAGNOSIS — C9000 Multiple myeloma not having achieved remission: Secondary | ICD-10-CM | POA: Diagnosis not present

## 2021-04-16 LAB — COMPREHENSIVE METABOLIC PANEL
ALT: 40 U/L (ref 0–44)
AST: 33 U/L (ref 15–41)
Albumin: 4.2 g/dL (ref 3.5–5.0)
Alkaline Phosphatase: 97 U/L (ref 38–126)
Anion gap: 8 (ref 5–15)
BUN: 24 mg/dL — ABNORMAL HIGH (ref 8–23)
CO2: 23 mmol/L (ref 22–32)
Calcium: 9.2 mg/dL (ref 8.9–10.3)
Chloride: 105 mmol/L (ref 98–111)
Creatinine, Ser: 1.06 mg/dL — ABNORMAL HIGH (ref 0.44–1.00)
GFR, Estimated: 59 mL/min — ABNORMAL LOW (ref >60)
Glucose, Bld: 150 mg/dL — ABNORMAL HIGH (ref 70–99)
Potassium: 4.2 mmol/L (ref 3.5–5.1)
Sodium: 136 mmol/L (ref 135–145)
Total Bilirubin: 0.2 mg/dL — ABNORMAL LOW (ref 0.3–1.2)
Total Protein: 7.3 g/dL (ref 6.5–8.1)

## 2021-04-16 LAB — CBC WITH DIFFERENTIAL/PLATELET
Abs Immature Granulocytes: 0.02 10*3/uL (ref 0.00–0.07)
Basophils Absolute: 0 10*3/uL (ref 0.0–0.1)
Basophils Relative: 0 %
Eosinophils Absolute: 0.2 10*3/uL (ref 0.0–0.5)
Eosinophils Relative: 3 %
HCT: 34.2 % — ABNORMAL LOW (ref 36.0–46.0)
Hemoglobin: 11.2 g/dL — ABNORMAL LOW (ref 12.0–15.0)
Immature Granulocytes: 0 %
Lymphocytes Relative: 20 %
Lymphs Abs: 1 10*3/uL (ref 0.7–4.0)
MCH: 28.1 pg (ref 26.0–34.0)
MCHC: 32.7 g/dL (ref 30.0–36.0)
MCV: 85.9 fL (ref 80.0–100.0)
Monocytes Absolute: 0.4 10*3/uL (ref 0.1–1.0)
Monocytes Relative: 8 %
Neutro Abs: 3.4 10*3/uL (ref 1.7–7.7)
Neutrophils Relative %: 69 %
Platelets: 107 10*3/uL — ABNORMAL LOW (ref 150–400)
RBC: 3.98 MIL/uL (ref 3.87–5.11)
RDW: 14.8 % (ref 11.5–15.5)
WBC: 5 10*3/uL (ref 4.0–10.5)
nRBC: 0 % (ref 0.0–0.2)

## 2021-04-16 LAB — MAGNESIUM: Magnesium: 1.3 mg/dL — ABNORMAL LOW (ref 1.7–2.4)

## 2021-04-16 MED ORDER — SODIUM CHLORIDE 0.9 % IV SOLN
25.0000 mg | Freq: Once | INTRAVENOUS | Status: AC
  Administered 2021-04-16: 25 mg via INTRAVENOUS
  Filled 2021-04-16: qty 1

## 2021-04-16 MED ORDER — MAGNESIUM SULFATE 2 GM/50ML IV SOLN
2.0000 g | Freq: Once | INTRAVENOUS | Status: AC
  Administered 2021-04-16: 2 g via INTRAVENOUS
  Filled 2021-04-16: qty 50

## 2021-04-16 MED ORDER — SODIUM CHLORIDE 0.9 % IV SOLN
Freq: Once | INTRAVENOUS | Status: AC
Start: 2021-04-16 — End: 2021-04-16
  Filled 2021-04-16: qty 250

## 2021-04-16 MED ORDER — MAGNESIUM SULFATE 4 GM/100ML IV SOLN
4.0000 g | Freq: Once | INTRAVENOUS | Status: AC
  Administered 2021-04-16: 4 g via INTRAVENOUS
  Filled 2021-04-16: qty 100

## 2021-04-16 MED ORDER — SODIUM CHLORIDE 0.9% FLUSH
10.0000 mL | Freq: Once | INTRAVENOUS | Status: AC
  Administered 2021-04-16: 10 mL via INTRAVENOUS
  Filled 2021-04-16: qty 10

## 2021-04-16 MED ORDER — SODIUM CHLORIDE 0.9 % IV SOLN: 6.0000 g | Freq: Once | INTRAVENOUS | Status: DC

## 2021-04-16 MED ORDER — HEPARIN SOD (PORK) LOCK FLUSH 100 UNIT/ML IV SOLN
500.0000 [IU] | Freq: Once | INTRAVENOUS | Status: AC
  Filled 2021-04-16: qty 5

## 2021-04-16 MED ORDER — BORTEZOMIB CHEMO SQ INJECTION 3.5 MG (2.5MG/ML)
2.0000 mg | Freq: Once | INTRAMUSCULAR | Status: AC
  Administered 2021-04-16: 2 mg via SUBCUTANEOUS
  Filled 2021-04-16: qty 0.8

## 2021-04-16 MED ORDER — ALBUTEROL SULFATE HFA 108 (90 BASE) MCG/ACT IN AERS: 2.0000 | INHALATION_SPRAY | Freq: Four times a day (QID) | RESPIRATORY_TRACT | 0 refills | Status: DC | PRN

## 2021-04-16 MED ORDER — HEPARIN SOD (PORK) LOCK FLUSH 100 UNIT/ML IV SOLN
INTRAVENOUS | Status: AC
  Administered 2021-04-16: 500 [IU] via INTRAVENOUS
  Filled 2021-04-16: qty 5

## 2021-04-16 NOTE — Progress Notes (Signed)
Hematology Oncology Progress Note  Clinic Day:  04/16/2021   Referring physician: Glean Hess, MD  Chief Complaint: Sarah [name] is a 65 y.o. 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   Initial bone marrow revealed 80% plasma cells.  Lambda free light chains were 1340.  She had nephrotic range proteinuria.  She initially underwent induction with RVD.  Revlimid maintenance was discontinued on 01/21/2017 secondary to intolerance.     07/19/2019 Bone marrow aspirate and biopsy on  revealed a normocellular marrow with but increased lambda-restricted plasma cells (9% aspirate, 40% CD138 immunohistochemistry).  Findings were consistent with recurrent plasma cell myeloma.  Flow cytometry revealed no monoclonal B-cell or phenotypically aberrant T-cell population. Cytogenetics were 65, XX (normal).  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    Lambda light chains have been followed: 22.2 (ratio 0.56) on 07/03/2017, 30.8 (ratio 0.78) on 09/02/2017, 36.9 (ratio 0.40) on 10/21/2017, 37.4 (ratio 0.41) on 12/16/2017, 70.7(ratio 0.31)  on 02/17/2018, 64.2 (ratio 0.27) on 04/07/2018, 78.9 (ratio 0.18) on 05/26/2018, 128.8 (ratio 0.17) on 08/06/2018, 181.5 (ratio 0.13) on 10/08/2018, 130.9 (ratio 0.13) on 10/20/2018, 160.7 (ratio 0.10) on 12/09/2018, 236.6 (ratio 0.07) on 02/01/2019,  363.6 (ratio 0.04) on 03/22/2019, 404.8 (ratio 0.04) on 04/05/2019, 420.7 (ratio 0.03) on 05/24/2019, 573.4 (ratio 0.03) on 06/23/2019, 451.05 (ratio 0.02) on 08/20/2019, 47.2 (ratio 0.13) on 10/25/2019, 22.4 (ratio 0.21) on 11/25/2019, 16.5 (ratio 0.33) on 01/10/2020, 14.6 (ratio 0.34) on 02/07/2020, 13.1 (ratio 0.31) on 03/07/2020, 10.1 (ratio 0.38) on 04/10/2020, and 9.5 (ratio 0.21) on 06/19/2020.   24 hour UPEP on 06/03/2019 revealed kappa free light chains 95.76, lambda free light chains 1,260.71, and ratio 0.08.  24 hour UPEP on 08/23/2019 revealed total protein of 782 mg/24 hrs with lambda free light chains 1,084.16 mg/L and ratio of 0.10 (1.03-31.76).  M spike in urine was 46.1% (361 mg/24 hrs).    Bone survey on 04/08/2016 and 05/28/2017 revealed no definite lytic lesion seen in the visualized skeleton.  Bone survey on 11/19/2018 revealed no suspicious lucent lesions and no acute bony abnormality.  PET scan on 07/12/2019 revealed no focal metabolic activity to suggest active myeloma within the skeleton. There were no lytic lesions identified on the CT portion of the exam or soft tissue plasmacytomas. There was no evidence of multiple myeloma.    Pretreatment RBC phenotype on 09/23/2019 was positive for C, e, DUFFY B, KIDD B, M, S, and s antigen; negative for c, E, KELL, DUFFY A, KIDD A, and N antigen.    09/27/2019 - 10/25/2019; 12/09/2019 - 03/13/2020 6 cycles of daratumumab and hyaluronidase-fihj, Pomalyst, and Decadron (DPd) .  Cycle #1 was complicated by fever and neutropenia requiring admission.  Cycle #2 was complicated by pneumonia requiring admission.  Cycle #6 was complicated with an ER evaluation for an elevated lactic acid.   05/10/2020 second autologous stem cell transplant at [facility]   She underwent conditioning with melphalan 140  mg/m2.    She is s/p week #3 Velcade maintenance (began 08/31/2020 - 09/28/2020).  She has no increase in neuropathy.  She has severe aortic stenosis.   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 Cardiology is following for a possible TAVR in the future.  Echo on 07/05/2020 revealed moderate to severe aortic stenosis with an EF of 50-55%.  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 Zometa was discontinued in 01/2017.  She has chronic nausea on Phenergan.     B12 deficiency.  B12 was 254 on 04/09/2017, 295 on 08/20/2018, and 391 on 10/08/2018.  She was on oral B12.  She received B12 monthly (last 09/14/2020).  Folate was 12.1 on 01/10/2020.    She has iron deficiency.  Ferritin was 32 on 07/01/2019.  She received Venofer on 07/15/2019 and 07/22/2019.   She has hypogammaglobulinemia.  IgG was 245 on 11/25/2019.  She received monthly IVIG (07/22/021 - 03/22/2020).  She received IVIG 400 mg/kg on 01/20/2020, 200 mg/kg on 02/17/2020, and 300 mg/kg on 03/22/2020.  IVIG on 16/04/9603 was complicated by acute renal failure. IgG trough level was 418 on 02/17/2020.  10/15/2019 - 10/20/2019 She was admitted to [facility] with fever and neutropenia.  Cultures were negative.  CXR was negative. She received broad spectrum antibiotics and daily Granix.  She received IVF for acute renal insufficiency due to diarrhea and dehydration.  Creatine was 1.66 on admission and 1.12 on discharge.    03/01/2020 - 03/05/2020 admitted to [facility] from with fever and neutropenia. CXR revealed no active cardiopulmonary disease. Chest CT with contrast revealed no acute intrathoracic pathology. There were findings which could be suggestive of prior granulomatous disease. She was treated with Cefepime and Vancomycin, then switched to ciprofloxacin on 03/03/2020.   05/08/2020 - 12/05/2021The patient was admitted to [facility] from  for autologous bone marrow transplant.   She received conditioning with melphalan 140 mg/m2.  Bone marrow transplant was on 05/10/2020. The patient developed fevers daily from  05/19/2020 - 05/24/2020. Blood cultures were + for strept sanguis bacteremia.  She was treated cefepime, vancomycin, and solumedrol for possible engraftment syndrome. Cefepime was switched to ceftriaxone; she completed 2 weeks of ceftriaxone on 06/03/2020. She had chemotherapy induced diarrhea.  She experienced urinary retention.  Feb 2022 patient has been on maintenance Velcade 2 mg every 2 weeks.  Chronic diarrhea seen by gastroenterology Dr. Vicente Males and was recommended to avoid artificial sugars in her diet including Diet Coke and coffee mate.  She feels some improvement of her diarrhea frequency since the dietary modification.  GI profile PCR negative, C. difficile toxin A+ B negative, fecal calprotectin 77 12/19/2020 colonoscopy showed 2 subcentimeter polyps in the ascending colon, resected and removed.  Pathology showed tubular adenoma.  No malignancy.  Normal mucosa in the entire examined colon.  Biopsied, pathology showed colonic mucosa with no significant pathological alteration.  Negative for active inflammation and features of chronicity.  Negative for microscopic colitis, dysplasia, and malignanc  Diabetes, patient has discontinued metformin due to diarrhea and started on glipizide 2.5 mg   03/05/2021-03/09/2021 Patient was hospitalized due to fever and chills, nonproductive cough, shortness of breath. X-ray showed right lower lobe atelectasis versus pneumonia.  Blood cultures negative.  Urine culture showed 60,000 colonies of Enterococcus facialis.  Patient received antibiotics. Patient also had abdominal pelvis CT scan for work-up of intermittent lower abdomen pain.No acute abnormality.   INTERVAL HISTORY  Sarah [name] is a 65 y.o. female who has above history reviewed by me today presents for follow up visit for management of multiple myeloma.  Finished a course of azithromycin for bronchitis.  Cough was slightly better and she feels that her symptoms are coming back.  She attributes to  sinus symptoms postnasal drip. No fever or chills Overall she feels better and ready to do additional treatments.   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   Due to Zometa   Cardiomyopathy, idiopathic ([place]) 08/21/2017   Overview:  Septal and apical hypokinesis with ef 40%   CHF (congestive heart failure) (HCC)    CKD (chronic kidney disease) stage 3, GFR 30-59 ml/min (HCC)    Depression    Diabetes mellitus (HCC)    Dizziness    Fatty liver    Frequent falls    GERD (gastroesophageal reflux disease)    Gout    Heart murmur    History of blood transfusion    History of bone marrow transplant ([place])    History of uterine fibroid    Hx of cardiac catheterization 06/05/2016   Overview:  Normal coronaries 2017   Hypertension    Hypomagnesemia    IDA (iron deficiency anemia)    Multiple myeloma ([place])    Personal history of chemotherapy    Renal cyst    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[place]) 06/05/2016   Overview:  Sp ablation 1999    Past Surgical History:  Procedure Laterality Date   ABDOMINAL HYSTERECTOMY     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   CARDIAC ELECTROPHYSIOLOGY MAPPING AND ABLATION     CARPAL TUNNEL RELEASE Bilateral    CHOLECYSTECTOMY  2008   COLONOSCOPY WITH PROPOFOL N/A 05/07/2017   Procedure: COLONOSCOPY WITH PROPOFOL;  Surgeon: [name], MD;  Location: [facility] ENDOSCOPY;  Service: Gastroenterology;  Laterality: N/A;   COLONOSCOPY WITH PROPOFOL N/A 12/19/2020   Procedure: COLONOSCOPY WITH PROPOFOL;  Surgeon: [name], MD;  Location: [facility] ENDOSCOPY;  Service: Gastroenterology;  Laterality: N/A;   ESOPHAGOGASTRODUODENOSCOPY (EGD) WITH PROPOFOL N/A 05/07/2017   Procedure: ESOPHAGOGASTRODUODENOSCOPY (EGD) WITH PROPOFOL;  Surgeon: [name], MD;   Location: [facility] ENDOSCOPY;  Service: Gastroenterology;  Laterality: N/A;   FOOT SURGERY Bilateral    INCONTINENCE SURGERY  2009   INTERSTIM IMPLANT PLACEMENT     other     over active bladder   OTHER SURGICAL HISTORY     bladder stimulator    PARTIAL HYSTERECTOMY  03/1996   fibroids   PORTA CATH INSERTION N/A 03/10/2019   Procedure: PORTA CATH INSERTION;  Surgeon: Algernon Huxley, MD;  Location: [facility] LAB;  Service: Cardiovascular;  Laterality: N/A;   TONSILLECTOMY  2007    Family History  Problem Relation Age of Onset   Colon cancer Father    Renal Disease Father    Diabetes Mellitus II Father    Melanoma Paternal Grandmother    Breast cancer Maternal Aunt 24   Anemia Mother    Heart disease Mother    Heart failure Mother    Renal Disease Mother    Congestive Heart Failure Mother    Heart disease Maternal Uncle    Throat cancer Maternal Uncle    Lung cancer Maternal Uncle  Liver disease Maternal Uncle    Heart failure Maternal Uncle    Hearing loss Son 5       Suicide     Social History:  reports that she quit smoking about 29 years ago. Her smoking use included cigarettes. She has a 20.00 pack-year smoking history. She has never used smokeless tobacco. She reports current alcohol use. She reports that she does not use drugs.  She is on disability. She notes exposure to perchloroethylene [facility]).  She lives much of her adult life in [place].   Her husband passed away.    Allergies:  Allergies  Allergen Reactions   Oxycodone-Acetaminophen Anaphylaxis    Swelling and rash   Celebrex [Celecoxib] Diarrhea   Codeine    Plerixafor     In 2016 during ASCT collection patient developed fever to 103.42F and required hospitalization   Benadryl [Diphenhydramine] Palpitations   Morphine Itching and Rash   Ondansetron Diarrhea   Tylenol [Acetaminophen] Itching and Rash    Current Medications: Current Outpatient Medications  Medication Sig Dispense Refill    acetaminophen (TYLENOL) 500 MG tablet Take 500 mg by mouth every 6 (six) hours as needed.     acyclovir (ZOVIRAX) 400 MG tablet Take 1 tablet (400 mg total) by mouth 2 (two) times daily. 180 tablet 1   albuterol (VENTOLIN HFA) 108 (90 Base) MCG/ACT inhaler Inhale 2 puffs into the lungs every 6 (six) hours as needed for wheezing or shortness of breath. 8 g 0   bisoprolol (ZEBETA) 5 MG tablet Take 5 mg by mouth daily.     FLUoxetine (PROZAC) 40 MG capsule TAKE 1 CAPSULE EVERY DAY 90 capsule 0   gabapentin (NEURONTIN) 300 MG capsule Take 1 capsule (300 mg total) by mouth 2 (two) times daily. 60 capsule 0   Multiple Vitamin (MULTIVITAMIN) tablet Take 1 tablet by mouth daily.     Multiple Vitamins-Minerals (HAIR/SKIN/NAILS) TABS Take 1 tablet by mouth daily.     omeprazole (PRILOSEC) 40 MG capsule Take 1 capsule (40 mg total) by mouth in the morning and at bedtime. 180 capsule 1   tiZANidine (ZANAFLEX) 2 MG tablet Take 1 tablet (2 mg total) by mouth every 6 (six) hours as needed for muscle spasms. 30 tablet 1   traZODone (DESYREL) 100 MG tablet Take 1 tablet (100 mg total) by mouth at bedtime. 90 tablet 1   allopurinol (ZYLOPRIM) 100 MG tablet TAKE 1 TABLET(100 MG) BY MOUTH DAILY as needed (Patient not taking: No sig reported) 90 tablet 1   diclofenac sodium (VOLTAREN) 1 % GEL Apply 2 g topically 4 (four) times daily. (Patient not taking: No sig reported) 100 g 1   diphenhydrAMINE (BENADRYL) 50 MG capsule Take 50 mg by mouth as needed. (Patient not taking: No sig reported)     diphenoxylate-atropine (LOMOTIL) 2.5-0.025 MG tablet Take 2 tablets by mouth 4 (four) times daily as needed for diarrhea or loose stools. (Patient not taking: No sig reported) 64 tablet 3   fluticasone (FLONASE) 50 MCG/ACT nasal spray Place 2 sprays into both nostrils daily. (Patient not taking: No sig reported) 16 g 2   glucose blood test strip      promethazine (PHENERGAN) 25 MG tablet Take 1 tablet (25 mg total) by mouth every 6  (six) hours as needed for nausea or vomiting. (Patient not taking: Reported on 04/16/2021) 90 tablet 1   promethazine-dextromethorphan (PROMETHAZINE-DM) 6.25-15 MG/5ML syrup Take 5 mLs by mouth 4 (four) times daily as needed for cough. (  Patient not taking: Reported on 04/16/2021) 180 mL 0   No current facility-administered medications for this visit.   Facility-Administered Medications Ordered in Other Visits  Medication Dose Route Frequency Provider Last Rate Last Admin   sodium chloride flush (NS) 0.9 % injection 10 mL  10 mL Intravenous PRN Earlie Server, MD   10 mL at 04/02/21 1856    Review of Systems  Constitutional:  Negative for chills, fever, malaise/fatigue and weight loss.  HENT:  Negative for sore throat.   Eyes:  Negative for redness.  Respiratory:  Positive for cough. Negative for shortness of breath and wheezing.   Cardiovascular:  Negative for chest pain, palpitations and leg swelling.  Gastrointestinal:  Positive for diarrhea and nausea. Negative for abdominal pain, blood in stool and vomiting.  Genitourinary:  Negative for dysuria.  Musculoskeletal:  Positive for back pain and joint pain. Negative for myalgias.  Skin:  Negative for rash.  Neurological:  Positive for tingling and sensory change. Negative for dizziness and tremors.  Endo/Heme/Allergies:  Does not bruise/bleed easily.  Psychiatric/Behavioral:  Negative for hallucinations.    Performance status (ECOG): 1  Vitals Blood pressure 131/82, pulse 69, temperature 97.6 F (36.4 C), resp. rate 18, weight 158 lb 8 oz (71.9 kg).  Physical Exam Vitals and nursing note reviewed.  Constitutional:      General: She is not in acute distress.    Appearance: She is well-developed. She is not ill-appearing, toxic-appearing or diaphoretic.     Interventions: Face mask in place.  HENT:     Head: Normocephalic and atraumatic.     Mouth/Throat:     Mouth: Mucous membranes are moist.     Pharynx: Oropharynx is clear.  Eyes:      General: No scleral icterus.    Pupils: Pupils are equal, round, and reactive to light.  Cardiovascular:     Rate and Rhythm: Normal rate and regular rhythm.     Heart sounds: Normal heart sounds. No murmur heard. Pulmonary:     Effort: Pulmonary effort is normal. No respiratory distress.     Breath sounds: Normal breath sounds. No wheezing or rales.  Chest:     Chest wall: No tenderness.  Abdominal:     General: Bowel sounds are normal. There is no distension.     Palpations: Abdomen is soft. There is no hepatomegaly, splenomegaly or mass.     Tenderness: There is no abdominal tenderness. There is no guarding or rebound.  Musculoskeletal:        General: No tenderness. Normal range of motion.     Cervical back: Normal range of motion and neck supple.     Right lower leg: No edema.     Left lower leg: No edema.  Lymphadenopathy:     Head:     Right side of head: No preauricular, posterior auricular or occipital adenopathy.     Left side of head: No preauricular, posterior auricular or occipital adenopathy.     Cervical: No cervical adenopathy.     Upper Body:     Right upper body: No supraclavicular adenopathy.     Left upper body: No supraclavicular adenopathy.  Skin:    General: Skin is warm and dry.  Neurological:     Mental Status: She is alert and oriented to person, place, and time. Mental status is at baseline.  Psychiatric:        Mood and Affect: Mood normal.   Laboratory data Infusion on 04/16/2021  Component Date  Value Ref Range Status   Magnesium 04/16/2021 1.3 (A) 1.7 - 2.4 mg/dL Final   Performed at [facility], [place], [place] [address]   Sodium 04/16/2021 136  135 - 145 mmol/L Final   Potassium 04/16/2021 4.2  3.5 - 5.1 mmol/L Final   Chloride 04/16/2021 105  98 - 111 mmol/L Final   CO2 04/16/2021 23  22 - 32 mmol/L Final   Glucose, Bld 04/16/2021 150 (A) 70 - 99 mg/dL Final   Glucose reference range applies only to samples  taken after fasting for at least 8 hours.   BUN 04/16/2021 24 (A) 8 - 23 mg/dL Final   Creatinine, Ser 04/16/2021 1.06 (A) 0.44 - 1.00 mg/dL Final   Calcium 04/16/2021 9.2  8.9 - 10.3 mg/dL Final   Total Protein 04/16/2021 7.3  6.5 - 8.1 g/dL Final   Albumin 04/16/2021 4.2  3.5 - 5.0 g/dL Final   AST 04/16/2021 33  15 - 41 U/L Final   ALT 04/16/2021 40  0 - 44 U/L Final   Alkaline Phosphatase 04/16/2021 97  38 - 126 U/L Final   Total Bilirubin 04/16/2021 0.2 (A) 0.3 - 1.2 mg/dL Final   GFR, Estimated 04/16/2021 59 (A) >60 mL/min Final   Comment: (NOTE) Calculated using the CKD-EPI Creatinine Equation (2021)    Anion gap 04/16/2021 8  5 - 15 Final   Performed at [facility], [place], [place] [address]   WBC 04/16/2021 5.0  4.0 - 10.5 K/uL Final   RBC 04/16/2021 3.98  3.87 - 5.11 MIL/uL Final   Hemoglobin 04/16/2021 11.2 (A) 12.0 - 15.0 g/dL Final   HCT 04/16/2021 34.2 (A) 36.0 - 46.0 % Final   MCV 04/16/2021 85.9  80.0 - 100.0 fL Final   MCH 04/16/2021 28.1  26.0 - 34.0 pg Final   MCHC 04/16/2021 32.7  30.0 - 36.0 g/dL Final   RDW 04/16/2021 14.8  11.5 - 15.5 % Final   Platelets 04/16/2021 107 (A) 150 - 400 K/uL Final   nRBC 04/16/2021 0.0  0.0 - 0.2 % Final   Neutrophils Relative % 04/16/2021 69  % Final   Neutro Abs 04/16/2021 3.4  1.7 - 7.7 K/uL Final   Lymphocytes Relative 04/16/2021 20  % Final   Lymphs Abs 04/16/2021 1.0  0.7 - 4.0 K/uL Final   Monocytes Relative 04/16/2021 8  % Final   Monocytes Absolute 04/16/2021 0.4  0.1 - 1.0 K/uL Final   Eosinophils Relative 04/16/2021 3  % Final   Eosinophils Absolute 04/16/2021 0.2  0.0 - 0.5 K/uL Final   Basophils Relative 04/16/2021 0  % Final   Basophils Absolute 04/16/2021 0.0  0.0 - 0.1 K/uL Final   Immature Granulocytes 04/16/2021 0  % Final   Abs Immature Granulocytes 04/16/2021 0.02  0.00 - 0.07 K/uL Final   Performed at [facility], [address] [place], [place] [address]     Assessment/Plan 1. Encounter for antineoplastic chemotherapy   2. Multiple myeloma in remission ([place])   3. Hypomagnesemia   4. B12 deficiency   5. Cough, unspecified type   6. 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[place])     # Recurrent lambda light chain multiple myeloma s/p second autologous bone marrow transplant [05/10/2020] Labs reviewed and discussed with patient. Multiple myeloma showed negative M protein.  Light chain ratio is normal. Immunofixation of serum protein showed IgM  monoclonal protein with kappa light chain specificity Discussed with her that she may have developed a second clone of plasma cell disorders.  Attention on follow-up. Proceed with Velcade today.  Continue prophylaxis with Valtrex up to 1 year after transplant-November 2022 Vaccination at [place] transplant RN coordinator [phone]).  # cough,   VQ scan showed no probability of PE.  No lower extremity DVT speech Possible allergy component.  Recommend patient to start taking Claritin daily.  Also albuterol as needed prescription was sent to  #Hyperglobulinemia post stem cell transplantation.  May consider  IVIG replacement therapy if she has frequent infections. .-Her IgG levels are normal.  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   She agrees with trying gabapentin. Start with 377m daily, titrate to 3098mBID if needed.   # iron deficiency anemia, Hemoglobin 11.8  #chronic diarrhea, Gastroenterology recommendation was reviewed.  Avoid artificial sugar.  Colonoscopy showed no microscopic colitis. Her symptom seems to have improved slightly after dietary modification. Velcade side effects may also contribute to diarrhea  #Chronic hypomagnesemia Magnesium is 1.1 today, proceed with  IV magnesium today. Off Slow-Mag 64 mg twice daily.  Per GI recommendation as this potentially precipitate her diarrhea.  continue magnesium checked twice a week +/- IV magnesium  #Dysphagia, chronic nausea, patient request to have IV Phenergan in the clinic with treatment.    # B12 deficiency, patient has been on vitamin B12 monthly. Last dose 11/09/2020. Vitamin B12 is 404 Will resume B12 injections monthly.   RTC: 1 week, lab magnesium/IV magnesium twice per week,  RTC in 2 weeks for labs (CBC with diff, CMP, Mg ), md  for  Velcade and IV Mg   I discussed the assessment and treatment plan with the patient.  The patient was provided an opportunity to ask questions and all were answered.  The patient agreed with the plan and demonstrated an understanding of the instructions.  The patient was advised to call back if the symptoms worsen or if the condition fails to improve as anticipated.   ZhEarlie ServerMD, PhD 04/16/2021

## 2021-04-16 NOTE — Progress Notes (Signed)
Patient here for follow up. Pt reports that she is starting to get a cough again. Also, reports soreness to shoulder blade.

## 2021-04-17 ENCOUNTER — Encounter: Payer: Self-pay | Admitting: Gastroenterology

## 2021-04-17 ENCOUNTER — Ambulatory Visit (INDEPENDENT_AMBULATORY_CARE_PROVIDER_SITE_OTHER): Payer: Medicare HMO | Admitting: Gastroenterology

## 2021-04-17 VITALS — BP 137/71 | HR 73 | Temp 99.3°F | Ht 68.0 in | Wt 159.8 lb

## 2021-04-17 DIAGNOSIS — R1013 Epigastric pain: Secondary | ICD-10-CM | POA: Diagnosis not present

## 2021-04-17 DIAGNOSIS — G8929 Other chronic pain: Secondary | ICD-10-CM

## 2021-04-17 MED ORDER — SUCRALFATE 1 G PO TABS: 1.0000 g | ORAL_TABLET | Freq: Three times a day (TID) | ORAL | 2 refills | Status: DC

## 2021-04-17 NOTE — Progress Notes (Signed)
[name] MD, MRCP(U.K) [address]  [place]  [place], [place] [address]  Main: [phone]  Fax: [phone]   Primary Care Physician: Glean Hess, MD  Primary Gastroenterologist:  Dr. [name]   C/c : Follow up abdominal pain   HPI: Sarah [name] is a 65 y.o. female  Summary of history :   She was last seen in my office on 05/19/2019.  I have seen her back in 2018 for history of diarrhea alternating with constipation. She has a history of multiple myeloma , constipation, has been using fentanyl patch for chronic pain.  Bowel movements are preceded by abdominal distention and bloating.  Has tried MiraLAX, fiber supplements docusate, senna which has not worked.  Denies use of any NSAIDs she is on metformin.  Complains of chronic nausea that began shortly after she eats.  She has a sensation of food sitting in her stomach for a long period of time.  We did initially try to put her on a high-fiber diet but it caused her to get significant diarrhea and it was stopped.   05/07/2017: EGD: Biopsies of stomach showed antral type mucosa with features of a healing mucosal injury.  I also performed a colonoscopy on the same day which was normal. Has had recurrent myeloma. 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  Has received chemotherapy.  She has severe aortic stenosis based on echo   05/20/2020.  Cardiology is following for possible TAVR.     11/16/2020: Hemoglobin 11.2 g platelet count 120 creatinine 1.14 iron studies show no abnormality  Interval history 11/16/2020-04/17/2021   03/05/2021 CT abdomen and pelvis with contrast shows no acute abnormalities 04/16/2021: Hemoglobin 11.2 g, CMP creatinine 1.06 otherwise LFTs are normal She states that she is here today to see me for burning sensation in her upper abdomen going on for a few years.  At times better with a bowel movement.  Takes Prilosec 40 mg twice a day 1 hour before meals.  Denies any  NSAID use.  Diarrhea has resolved since the last visit.  She has not had further heart valve.  She is due to see cardiology soon.      Current Outpatient Medications  Medication Sig Dispense Refill   acetaminophen (TYLENOL) 500 MG tablet Take 500 mg by mouth every 6 (six) hours as needed.     acyclovir (ZOVIRAX) 400 MG tablet Take 1 tablet (400 mg total) by mouth 2 (two) times daily. 180 tablet 1   albuterol (VENTOLIN HFA) 108 (90 Base) MCG/ACT inhaler Inhale 2 puffs into the lungs every 6 (six) hours as needed for wheezing or shortness of breath. 8 g 0   bisoprolol (ZEBETA) 5 MG tablet Take 5 mg by mouth daily.     FLUoxetine (PROZAC) 40 MG capsule TAKE 1 CAPSULE EVERY DAY 90 capsule 0   gabapentin (NEURONTIN) 300 MG capsule Take 1 capsule (300 mg total) by mouth 2 (two) times daily. 60 capsule 0   glucose blood test strip      Multiple Vitamin (MULTIVITAMIN) tablet Take 1 tablet by mouth daily.     Multiple Vitamins-Minerals (HAIR/SKIN/NAILS) TABS Take 1 tablet by mouth daily.     omeprazole (PRILOSEC) 40 MG capsule Take 1 capsule (40 mg total) by mouth in the morning and at bedtime. 180 capsule 1   tiZANidine (ZANAFLEX) 2 MG tablet TAKE 1 TABLET(2 MG) BY MOUTH EVERY 6 HOURS AS NEEDED FOR MUSCLE SPASMS 30 tablet 0  traZODone (DESYREL) 100 MG tablet Take 1 tablet (100 mg total) by mouth at bedtime. 90 tablet 1   allopurinol (ZYLOPRIM) 100 MG tablet TAKE 1 TABLET(100 MG) BY MOUTH DAILY as needed (Patient not taking: No sig reported) 90 tablet 1   diclofenac sodium (VOLTAREN) 1 % GEL Apply 2 g topically 4 (four) times daily. (Patient not taking: No sig reported) 100 g 1   diphenhydrAMINE (BENADRYL) 50 MG capsule Take 50 mg by mouth as needed. (Patient not taking: No sig reported)     diphenoxylate-atropine (LOMOTIL) 2.5-0.025 MG tablet Take 2 tablets by mouth 4 (four) times daily as needed for diarrhea or loose stools. (Patient not taking: No sig reported) 64 tablet 3   fluticasone (FLONASE)  50 MCG/ACT nasal spray Place 2 sprays into both nostrils daily. (Patient not taking: No sig reported) 16 g 2   promethazine (PHENERGAN) 25 MG tablet Take 1 tablet (25 mg total) by mouth every 6 (six) hours as needed for nausea or vomiting. (Patient not taking: No sig reported) 90 tablet 1   promethazine-dextromethorphan (PROMETHAZINE-DM) 6.25-15 MG/5ML syrup Take 5 mLs by mouth 4 (four) times daily as needed for cough. (Patient not taking: No sig reported) 180 mL 0   No current facility-administered medications for this visit.   Facility-Administered Medications Ordered in Other Visits  Medication Dose Route Frequency Provider Last Rate Last Admin   sodium chloride flush (NS) 0.9 % injection 10 mL  10 mL Intravenous PRN Earlie Server, MD   10 mL at 04/02/21 0904    Allergies as of 04/17/2021 - Review Complete 04/17/2021  Allergen Reaction Noted   Oxycodone-acetaminophen Anaphylaxis 09/01/2011   Celebrex [celecoxib] Diarrhea 01/04/2019   Codeine  09/05/2017   Plerixafor  12/31/2019   Benadryl [diphenhydramine] Palpitations 04/02/2016   Morphine Itching and Rash 04/02/2016   Ondansetron Diarrhea 09/01/2011   Tylenol [acetaminophen] Itching and Rash 04/02/2016    ROS:  General: Negative for anorexia, weight loss, fever, chills, fatigue, weakness. ENT: Negative for hoarseness, difficulty swallowing , nasal congestion. CV: Negative for chest pain, angina, palpitations, dyspnea on exertion, peripheral edema.  Respiratory: Negative for dyspnea at rest, dyspnea on exertion, cough, sputum, wheezing.  GI: See history of present illness. GU:  Negative for dysuria, hematuria, urinary incontinence, urinary frequency, nocturnal urination.  Endo: Negative for unusual weight change.    Physical Examination:   BP 137/71 (BP Location: Right Arm, Patient Position: Sitting, Cuff Size: Normal)   Pulse 73   Temp 99.3 F (37.4 C) (Oral)   Ht 5' 8"  (1.727 m)   Wt 159 lb 12.8 oz (72.5 kg)   BMI 24.30  kg/m   General: Well-nourished, well-developed in no acute distress.  Eyes: No icterus. Conjunctivae pink. Mouth: Oropharyngeal mucosa moist and pink , no lesions erythema or exudate. Abdomen: Bowel sounds are normal, mild epigastric tenderness nondistended, no hepatosplenomegaly or masses, no abdominal bruits or hernia , no rebound or guarding.   Extremities: No lower extremity edema. No clubbing or deformities. Neuro: Alert and oriented x 3.  Grossly intact. Skin: Warm and dry, no jaundice.   Psych: Alert and cooperative, normal mood and affect.   Imaging Studies: DG Chest 2 View  Result Date: 04/02/2021 CLINICAL DATA:  Shortness of breath EXAM: CHEST - 2 VIEW COMPARISON:  03/26/2021 FINDINGS: Stable right-sided chest port. The heart size and mediastinal contours are within normal limits. No focal airspace consolidation, pleural effusion, or pneumothorax. The visualized skeletal structures are unremarkable. IMPRESSION: No active cardiopulmonary disease. Electronically  Signed   By: Davina Poke D.O.   On: 04/02/2021 15:39   DG Chest 2 View  Result Date: 03/26/2021 CLINICAL DATA:  65 year old female with history of cough. Pneumonia for the past 2 weeks. History of multiple myeloma. EXAM: CHEST - 2 VIEW COMPARISON:  Chest x-ray 03/05/2021. FINDINGS: Right internal jugular single-lumen porta cath with tip terminating at the superior cavoatrial junction. Lung volumes are normal. No consolidative airspace disease. No pleural effusions. No pneumothorax. No pulmonary nodule or mass noted. Pulmonary vasculature and the cardiomediastinal silhouette are within normal limits. IMPRESSION: 1.  No radiographic evidence of acute cardiopulmonary disease. Electronically Signed   By: Vinnie Langton M.D.   On: 03/26/2021 15:00   NM Pulmonary Perfusion  Result Date: 04/03/2021 CLINICAL DATA:  Shortness of breath.  Recent hospitalization. EXAM: NUCLEAR MEDICINE PERFUSION LUNG SCAN TECHNIQUE: Perfusion  images were obtained in multiple projections after intravenous injection of radiopharmaceutical. Ventilation scans intentionally deferred if perfusion scan and chest x-ray adequate for interpretation during COVID 19 epidemic. RADIOPHARMACEUTICALS:  4.4 mCi Tc-48mMAA IV COMPARISON:  Chest x-ray from yesterday. FINDINGS: No wedge shaped peripheral perfusion defects to suggest acute pulmonary embolism. IMPRESSION: Normal perfusion lung scan. Electronically Signed   By: WTitus DubinM.D.   On: 04/03/2021 14:17   UKoreaVenous Img Lower Bilateral  Result Date: 04/02/2021 CLINICAL DATA:  Leg swelling and pain EXAM: BILATERAL LOWER EXTREMITY VENOUS DOPPLER ULTRASOUND TECHNIQUE: Gray-scale sonography with compression, as well as color and duplex ultrasound, were performed to evaluate the deep venous system(s) from the level of the common femoral vein through the popliteal and proximal calf veins. COMPARISON:  None. FINDINGS: VENOUS Normal compressibility of the common femoral, superficial femoral, and popliteal veins, as well as the visualized calf veins. Visualized portions of profunda femoral vein and great saphenous vein unremarkable. No filling defects to suggest DVT on grayscale or color Doppler imaging. Doppler waveforms show normal direction of venous flow, normal respiratory plasticity and response to augmentation. OTHER None. Limitations: none IMPRESSION: No lower extremity DVT. Electronically Signed   By: FMiachel RouxM.D.   On: 04/02/2021 15:57    Assessment and Plan:   CDanielys Madryis a 65y.o. y/o female with a history of multiple myeloma status post bone marrow transplant.  History of severe aortic stenosis being evaluated for a TAVR procedure.  Previously being evaluated with upper endoscopy and colonoscopy if for epigastric pain back in 2018 which was not grossly abnormal.  Previously had diarrhea secondary to consumption of artificial sugars and foods.  Presently active issues burning sensation  in the epigastric area.  Likely dyspepsia  With severe aortic stenosis would like to avoid endoscopy unless absolutely needed.  Plan 1.  H. pylori breath test 2.  Add Carafate in addition to Prilosec 40 mg twice daily.  It is also possible that the pain is musculoskeletal in nature advised her to take Tylenol 1 tablet 3 times a day to see if it helps as she is tender over her xiphoid process  Dr KJonathon Bellows MD,MRCP ([facility] Follow up in as needed

## 2021-04-19 ENCOUNTER — Encounter: Payer: Self-pay | Admitting: Oncology

## 2021-04-19 ENCOUNTER — Inpatient Hospital Stay: Payer: Medicare HMO

## 2021-04-19 ENCOUNTER — Ambulatory Visit: Payer: Medicare HMO

## 2021-04-19 ENCOUNTER — Other Ambulatory Visit: Payer: Medicare HMO

## 2021-04-19 LAB — H. PYLORI BREATH TEST: H pylori Breath Test: NEGATIVE

## 2021-04-20 DIAGNOSIS — C9000 Multiple myeloma not having achieved remission: Secondary | ICD-10-CM | POA: Diagnosis not present

## 2021-04-23 ENCOUNTER — Encounter: Payer: Self-pay | Admitting: Hematology and Oncology

## 2021-04-23 ENCOUNTER — Ambulatory Visit: Payer: Medicare HMO

## 2021-04-23 ENCOUNTER — Inpatient Hospital Stay: Payer: Medicare HMO

## 2021-04-23 ENCOUNTER — Other Ambulatory Visit: Payer: Self-pay

## 2021-04-23 ENCOUNTER — Encounter: Payer: Self-pay | Admitting: Internal Medicine

## 2021-04-23 ENCOUNTER — Ambulatory Visit: Payer: Medicare HMO | Admitting: Oncology

## 2021-04-23 ENCOUNTER — Other Ambulatory Visit: Payer: Medicare HMO

## 2021-04-23 DIAGNOSIS — N1831 Chronic kidney disease, stage 3a: Secondary | ICD-10-CM | POA: Diagnosis not present

## 2021-04-23 DIAGNOSIS — I129 Hypertensive chronic kidney disease with stage 1 through stage 4 chronic kidney disease, or unspecified chronic kidney disease: Secondary | ICD-10-CM | POA: Diagnosis not present

## 2021-04-23 DIAGNOSIS — C9000 Multiple myeloma not having achieved remission: Secondary | ICD-10-CM | POA: Diagnosis not present

## 2021-04-23 DIAGNOSIS — Z5112 Encounter for antineoplastic immunotherapy: Secondary | ICD-10-CM | POA: Diagnosis not present

## 2021-04-23 DIAGNOSIS — E538 Deficiency of other specified B group vitamins: Secondary | ICD-10-CM | POA: Diagnosis not present

## 2021-04-23 DIAGNOSIS — F32A Depression, unspecified: Secondary | ICD-10-CM | POA: Diagnosis not present

## 2021-04-23 DIAGNOSIS — D801 Nonfamilial hypogammaglobulinemia: Secondary | ICD-10-CM | POA: Diagnosis not present

## 2021-04-23 DIAGNOSIS — R197 Diarrhea, unspecified: Secondary | ICD-10-CM | POA: Diagnosis not present

## 2021-04-23 DIAGNOSIS — E1122 Type 2 diabetes mellitus with diabetic chronic kidney disease: Secondary | ICD-10-CM | POA: Diagnosis not present

## 2021-04-23 LAB — CBC WITH DIFFERENTIAL/PLATELET
Abs Immature Granulocytes: 0.02 10*3/uL (ref 0.00–0.07)
Basophils Absolute: 0 10*3/uL (ref 0.0–0.1)
Basophils Relative: 1 %
Eosinophils Absolute: 0.1 10*3/uL (ref 0.0–0.5)
Eosinophils Relative: 2 %
HCT: 35.8 % — ABNORMAL LOW (ref 36.0–46.0)
Hemoglobin: 11.8 g/dL — ABNORMAL LOW (ref 12.0–15.0)
Immature Granulocytes: 0 %
Lymphocytes Relative: 23 %
Lymphs Abs: 1.2 10*3/uL (ref 0.7–4.0)
MCH: 27.8 pg (ref 26.0–34.0)
MCHC: 33 g/dL (ref 30.0–36.0)
MCV: 84.2 fL (ref 80.0–100.0)
Monocytes Absolute: 0.4 10*3/uL (ref 0.1–1.0)
Monocytes Relative: 8 %
Neutro Abs: 3.5 10*3/uL (ref 1.7–7.7)
Neutrophils Relative %: 66 %
Platelets: 112 10*3/uL — ABNORMAL LOW (ref 150–400)
RBC: 4.25 MIL/uL (ref 3.87–5.11)
RDW: 14.4 % (ref 11.5–15.5)
WBC: 5.3 10*3/uL (ref 4.0–10.5)
nRBC: 0 % (ref 0.0–0.2)

## 2021-04-23 LAB — COMPREHENSIVE METABOLIC PANEL
ALT: 40 U/L (ref 0–44)
AST: 32 U/L (ref 15–41)
Albumin: 4.3 g/dL (ref 3.5–5.0)
Alkaline Phosphatase: 95 U/L (ref 38–126)
Anion gap: 7 (ref 5–15)
BUN: 24 mg/dL — ABNORMAL HIGH (ref 8–23)
CO2: 25 mmol/L (ref 22–32)
Calcium: 8.9 mg/dL (ref 8.9–10.3)
Chloride: 104 mmol/L (ref 98–111)
Creatinine, Ser: 1.06 mg/dL — ABNORMAL HIGH (ref 0.44–1.00)
GFR, Estimated: 59 mL/min — ABNORMAL LOW (ref >60)
Glucose, Bld: 154 mg/dL — ABNORMAL HIGH (ref 70–99)
Potassium: 4 mmol/L (ref 3.5–5.1)
Sodium: 136 mmol/L (ref 135–145)
Total Bilirubin: 0.7 mg/dL (ref 0.3–1.2)
Total Protein: 7.3 g/dL (ref 6.5–8.1)

## 2021-04-23 LAB — MAGNESIUM: Magnesium: 1.1 mg/dL — ABNORMAL LOW (ref 1.7–2.4)

## 2021-04-23 MED ORDER — SODIUM CHLORIDE 0.9 % IV SOLN
25.0000 mg | Freq: Once | INTRAVENOUS | Status: AC
  Administered 2021-04-23: 25 mg via INTRAVENOUS
  Filled 2021-04-23: qty 1

## 2021-04-23 MED ORDER — SODIUM CHLORIDE 0.9 % IV SOLN
Freq: Once | INTRAVENOUS | Status: AC
  Filled 2021-04-23: qty 250

## 2021-04-23 MED ORDER — MAGNESIUM SULFATE 2 GM/50ML IV SOLN
2.0000 g | Freq: Once | INTRAVENOUS | Status: AC
  Administered 2021-04-23: 2 g via INTRAVENOUS
  Filled 2021-04-23: qty 50

## 2021-04-23 MED ORDER — HEPARIN SOD (PORK) LOCK FLUSH 100 UNIT/ML IV SOLN
500.0000 [IU] | Freq: Once | INTRAVENOUS | Status: AC
  Administered 2021-04-23: 500 [IU] via INTRAVENOUS
  Filled 2021-04-23: qty 5

## 2021-04-23 MED ORDER — MAGNESIUM SULFATE 4 GM/100ML IV SOLN
4.0000 g | Freq: Once | INTRAVENOUS | Status: AC
  Administered 2021-04-23: 4 g via INTRAVENOUS
  Filled 2021-04-23: qty 100

## 2021-04-23 MED ORDER — SODIUM CHLORIDE 0.9 % IV SOLN: 6.0000 g | Freq: Once | INTRAVENOUS | Status: DC

## 2021-04-23 NOTE — Progress Notes (Signed)
Mag level 1.1 today.  Pt received 6 grams of magnesium IV per standing orders with IV phenergan as well.  Pt tolerated all infusions well and left the infusion suite stable and ambulatory.

## 2021-04-23 NOTE — Patient Instructions (Signed)
[facility] ONCOLOGY  Discharge Instructions: Thank you for choosing [facility] to provide your oncology and hematology care.  If you have a lab appointment with the [facility], please go directly to the [facility] and check in at the registration area.  Wear comfortable clothing and clothing appropriate for easy access to any Portacath or PICC line.   We strive to give you quality time with your provider. You may need to reschedule your appointment if you arrive late (15 or more minutes).  Arriving late affects you and other patients whose appointments are after yours.  Also, if you miss three or more appointments without notifying the office, you may be dismissed from the clinic at the provider's discretion.      For prescription refill requests, have your pharmacy contact our office and allow 72 hours for refills to be completed.    Today you received the following chemotherapy and/or immunotherapy agents magnesium, phenergan       To help prevent nausea and vomiting after your treatment, we encourage you to take your nausea medication as directed.  BELOW ARE SYMPTOMS THAT SHOULD BE REPORTED IMMEDIATELY: *FEVER GREATER THAN 100.4 F (38 C) OR HIGHER *CHILLS OR SWEATING *NAUSEA AND VOMITING THAT IS NOT CONTROLLED WITH YOUR NAUSEA MEDICATION *UNUSUAL SHORTNESS OF BREATH *UNUSUAL BRUISING OR BLEEDING *URINARY PROBLEMS (pain or burning when urinating, or frequent urination) *BOWEL PROBLEMS (unusual diarrhea, constipation, pain near the anus) TENDERNESS IN MOUTH AND THROAT WITH OR WITHOUT PRESENCE OF ULCERS (sore throat, sores in mouth, or a toothache) UNUSUAL RASH, SWELLING OR PAIN  UNUSUAL VAGINAL DISCHARGE OR ITCHING   Items with * indicate a potential emergency and should be followed up as soon as possible or go to the Emergency Department if any problems should occur.  Please show the CHEMOTHERAPY ALERT CARD or IMMUNOTHERAPY ALERT CARD  at check-in to the Emergency Department and triage nurse.  Should you have questions after your visit or need to cancel or reschedule your appointment, please contact [facility]  [phone] and follow the prompts.  Office hours are 8:00 a.m. to 4:30 p.m. Monday - Friday. Please note that voicemails left after 4:00 p.m. may not be returned until the following business day.  We are closed weekends and major holidays. You have access to a nurse at all times for urgent questions. Please call the main number to the clinic [phone] and follow the prompts.  For any non-urgent questions, you may also contact your provider using MyChart. We now offer e-Visits for anyone 13 and older to request care online for non-urgent symptoms. For details visit [email or web address].   Also download the MyChart app! Go to the app store, search "MyChart", open the app, select [facility], and log in with your MyChart username and password.  Due to Covid, a mask is required upon entering the hospital/clinic. If you do not have a mask, one will be given to you upon arrival. For doctor visits, patients may have 1 support person aged 10 or older with them. For treatment visits, patients cannot have anyone with them due to current Covid guidelines and our immunocompromised population.   Promethazine Injection What is this medication? PROMETHAZINE (proe METH a zeen) prevents and treats the symptoms of an allergic reaction. It works by blocking histamine, a substance released by the body during an allergic reaction. It may also help you relax, go to sleep, and relieve nausea, vomiting, or pain before or after  procedures. It can also treat motion sickness. It works by helping your nervous system calm down by blocking substances in the body that may cause nausea and vomiting. It belongs to a group of medications called antihistamines. This medicine may be used for other purposes; ask your  health care provider or pharmacist if you have questions. COMMON BRAND NAME(S): Anergan-50, Pentazine, Phenergan What should I tell my care team before I take this medication? They need to know if you have any of these conditions: Blockage in your bowel Diabetes Glaucoma Have trouble controlling your muscles Heart disease Liver disease Low blood counts, like low white cell, platelet, or red cell counts Lung or breathing disease, like asthma Parkinson's disease Prostate disease Seizures Stomach or intestine problems Trouble passing urine An unusual or allergic reaction to promethazine, sulfites, other medications, foods, dyes, or preservatives Pregnant or trying to get pregnant Breast-feeding How should I use this medication? This medication is for injection into a muscle, or into a vein. It is given in a hospital or clinic setting. Talk to your care team about the use of this medication in children. This medication should not be given to infants and children younger than 40 years old. Overdosage: If you think you have taken too much of this medicine contact a poison control center or emergency room at once. NOTE: This medicine is only for you. Do not share this medicine with others. What if I miss a dose? This does not apply. What may interact with this medication? Alcohol Antihistamines for allergy, cough, and cold Atropine Certain medications for anxiety or sleep Certain medications for bladder problems like oxybutynin, tolterodine Certain medications for depression like amitriptyline, fluoxetine, sertraline Certain medications for Parkinson's disease like benztropine, trihexyphenidyl Certain medications for stomach problems like dicyclomine, hyoscyamine Certain medications for travel sickness like scopolamine Epinephrine General anesthetics like halothane, isoflurane, methoxyflurane, propofol Ipratropium MAOIs like Marplan, Nardil, and Parnate Medications for high blood  pressure Medications for seizures like phenobarbital, primidone, phenytoin Medications that relax muscles for surgery Metoclopramide Narcotic medications for pain This list may not describe all possible interactions. Give your health care provider a list of all the medicines, herbs, non-prescription drugs, or dietary supplements you use. Also tell them if you smoke, drink alcohol, or use illegal drugs. Some items may interact with your medicine. What should I watch for while using this medication? Your condition will be monitored carefully while you are receiving this medication. Your care team will discuss with you the risks and the benefits of using this medication. This medication has caused serious side effects in some patients after it was injected into a vein. Watch closely for any signs or symptoms of a local reaction like burning, pain, redness, swelling, and blistering and tell your care team immediately if any occur. These symptoms may occur when you receive the injection or may occur hours or even days after the injection. You may get drowsy or dizzy. Do not drive, use machinery, or do anything that needs mental alertness until you know how this medication affects you. To reduce the risk of dizzy or fainting spells, do not stand or sit up quickly, especially if you are an older patient. Alcohol may increase dizziness and drowsiness. Avoid alcoholic drinks. Your mouth may get dry. Chewing sugarless gum or sucking hard candy, and drinking plenty of water will help. This medication may cause dry eyes and blurred vision. If you wear contact lenses you may feel some discomfort. Lubricating drops may help. See your care  team if the problem does not go away or is severe. Keep out of the sun, or wear protective clothing outdoors and use a sunscreen. Do not use sun lamps or sun tanning beds or booths. This medication may increase blood sugar. Ask your care team if changes in diet or medications are  needed if you have diabetes. What side effects may I notice from receiving this medication? Side effects that you should report to your care team as soon as possible: Allergic reactions-skin rash, itching, hives, swelling of the face, lips, tongue, or throat CNS depression-slow or shallow breathing, shortness of breath, feeling faint, dizziness, confusion, trouble staying awake High fever stiff muscles, increased sweating, fast or irregular heartbeat, and confusion, which may be signs of neuroleptic malignant syndrome Infection-fever, chills, cough, or sore throat Liver injury-right upper belly pain, loss of appetite, nausea, light-colored stool, dark yellow or brown urine, yellowing skin or eyes, unusual weakness or fatigue Pain, redness, or irritation at injection site Seizures Sudden eye pain or change in vision such as blurry vision, seeing halos around lights, vision loss Trouble passing urine Uncontrolled and repetitive body movements, muscle stiffness or spasms, tremors or shaking, loss of balance or coordination, restlessness, shuffling walk, which may be signs of extrapyramidal symptoms (EPS) Side effects that usually do not require medical attention (report to your care team if they continue or are bothersome): Confusion Constipation Dizziness Drowsiness Dry mouth Sensitivity to light Vivid dreams or nightmares This list may not describe all possible side effects. Call your doctor for medical advice about side effects. You may report side effects to FDA at 1-800-FDA-1088. Where should I keep my medication? This medication is given in a hospital or clinic and will not be stored at home. NOTE: This sheet is a summary. It may not cover all possible information. If you have questions about this medicine, talk to your doctor, pharmacist, or health care provider.  2022 Elsevier/Gold Standard (2020-09-26 10:25:40)   Magnesium Sulfate Injection What is this medication? MAGNESIUM  SULFATE (mag NEE zee um SUL fate) prevents and treats low levels of magnesium in your body. It may also be used to prevent and treat seizures during pregnancy in people with high blood pressure disorders, such as preeclampsia or eclampsia. Magnesium plays an important role in maintaining the health of your muscles and nervous system. This medicine may be used for other purposes; ask your health care provider or pharmacist if you have questions. What should I tell my care team before I take this medication? They need to know if you have any of these conditions: Heart disease History of irregular heart beat Kidney disease An unusual or allergic reaction to magnesium sulfate, medications, foods, dyes, or preservatives Pregnant or trying to get pregnant Breast-feeding How should I use this medication? This medication is for infusion into a vein. It is given in a hospital or clinic setting. Talk to your care team about the use of this medication in children. While this medication may be prescribed for selected conditions, precautions do apply. Overdosage: If you think you have taken too much of this medicine contact a poison control center or emergency room at once. NOTE: This medicine is only for you. Do not share this medicine with others. What if I miss a dose? This does not apply. What may interact with this medication? Certain medications for anxiety or sleep Certain medications for seizures like phenobarbital Digoxin Medications that relax muscles for surgery Narcotic medications for pain This list may not describe  all possible interactions. Give your health care provider a list of all the medicines, herbs, non-prescription drugs, or dietary supplements you use. Also tell them if you smoke, drink alcohol, or use illegal drugs. Some items may interact with your medicine. What should I watch for while using this medication? Your condition will be monitored carefully while you are receiving  this medication. You may need blood work done while you are receiving this medication. What side effects may I notice from receiving this medication? Side effects that you should report to your care team as soon as possible: Allergic reactions-skin rash, itching, hives, swelling of the face, lips, tongue, or throat High magnesium level-confusion, drowsiness, facial flushing, redness, sweating, muscle weakness, fast or irregular heartbeat, trouble breathing Low blood pressure-dizziness, feeling faint or lightheaded, blurry vision Side effects that usually do not require medical attention (report to your care team if they continue or are bothersome): Headache Nausea This list may not describe all possible side effects. Call your doctor for medical advice about side effects. You may report side effects to FDA at 1-800-FDA-1088. Where should I keep my medication? This medication is given in a hospital or clinic and will not be stored at home. NOTE: This sheet is a summary. It may not cover all possible information. If you have questions about this medicine, talk to your doctor, pharmacist, or health care provider.  2022 Elsevier/Gold Standard (2020-08-07 13:10:26)

## 2021-04-24 ENCOUNTER — Ambulatory Visit: Payer: Medicare HMO | Admitting: Internal Medicine

## 2021-04-26 ENCOUNTER — Other Ambulatory Visit: Payer: Medicare HMO

## 2021-04-26 ENCOUNTER — Ambulatory Visit: Payer: Medicare HMO

## 2021-04-30 ENCOUNTER — Inpatient Hospital Stay (HOSPITAL_BASED_OUTPATIENT_CLINIC_OR_DEPARTMENT_OTHER): Payer: Medicare HMO | Admitting: Oncology

## 2021-04-30 ENCOUNTER — Other Ambulatory Visit: Payer: Self-pay | Admitting: Internal Medicine

## 2021-04-30 ENCOUNTER — Inpatient Hospital Stay: Payer: Medicare HMO

## 2021-04-30 ENCOUNTER — Other Ambulatory Visit: Payer: Self-pay

## 2021-04-30 ENCOUNTER — Encounter: Payer: Self-pay | Admitting: Oncology

## 2021-04-30 VITALS — BP 154/89 | HR 77 | Temp 96.6°F | Resp 18 | Wt 160.7 lb

## 2021-04-30 DIAGNOSIS — E538 Deficiency of other specified B group vitamins: Secondary | ICD-10-CM

## 2021-04-30 DIAGNOSIS — D649 Anemia, unspecified: Secondary | ICD-10-CM

## 2021-04-30 DIAGNOSIS — T451X5A Adverse effect of antineoplastic and immunosuppressive drugs, initial encounter: Secondary | ICD-10-CM | POA: Diagnosis not present

## 2021-04-30 DIAGNOSIS — C9001 Multiple myeloma in remission: Secondary | ICD-10-CM

## 2021-04-30 DIAGNOSIS — C9000 Multiple myeloma not having achieved remission: Secondary | ICD-10-CM | POA: Diagnosis not present

## 2021-04-30 DIAGNOSIS — E1122 Type 2 diabetes mellitus with diabetic chronic kidney disease: Secondary | ICD-10-CM | POA: Diagnosis not present

## 2021-04-30 DIAGNOSIS — Z5111 Encounter for antineoplastic chemotherapy: Secondary | ICD-10-CM

## 2021-04-30 DIAGNOSIS — I129 Hypertensive chronic kidney disease with stage 1 through stage 4 chronic kidney disease, or unspecified chronic kidney disease: Secondary | ICD-10-CM | POA: Diagnosis not present

## 2021-04-30 DIAGNOSIS — F5101 Primary insomnia: Secondary | ICD-10-CM

## 2021-04-30 DIAGNOSIS — Z9484 Stem cells transplant status: Secondary | ICD-10-CM

## 2021-04-30 DIAGNOSIS — N1831 Chronic kidney disease, stage 3a: Secondary | ICD-10-CM | POA: Diagnosis not present

## 2021-04-30 DIAGNOSIS — G629 Polyneuropathy, unspecified: Secondary | ICD-10-CM

## 2021-04-30 DIAGNOSIS — G620 Drug-induced polyneuropathy: Secondary | ICD-10-CM | POA: Diagnosis not present

## 2021-04-30 DIAGNOSIS — R197 Diarrhea, unspecified: Secondary | ICD-10-CM | POA: Diagnosis not present

## 2021-04-30 DIAGNOSIS — D801 Nonfamilial hypogammaglobulinemia: Secondary | ICD-10-CM | POA: Diagnosis not present

## 2021-04-30 DIAGNOSIS — F32A Depression, unspecified: Secondary | ICD-10-CM | POA: Diagnosis not present

## 2021-04-30 DIAGNOSIS — Z5112 Encounter for antineoplastic immunotherapy: Secondary | ICD-10-CM | POA: Diagnosis not present

## 2021-04-30 DIAGNOSIS — R110 Nausea: Secondary | ICD-10-CM

## 2021-04-30 LAB — CBC WITH DIFFERENTIAL/PLATELET
Abs Immature Granulocytes: 0.02 10*3/uL (ref 0.00–0.07)
Basophils Absolute: 0 10*3/uL (ref 0.0–0.1)
Basophils Relative: 0 %
Eosinophils Absolute: 0.1 10*3/uL (ref 0.0–0.5)
Eosinophils Relative: 2 %
HCT: 34.6 % — ABNORMAL LOW (ref 36.0–46.0)
Hemoglobin: 11.5 g/dL — ABNORMAL LOW (ref 12.0–15.0)
Immature Granulocytes: 0 %
Lymphocytes Relative: 20 %
Lymphs Abs: 1.2 10*3/uL (ref 0.7–4.0)
MCH: 27.8 pg (ref 26.0–34.0)
MCHC: 33.2 g/dL (ref 30.0–36.0)
MCV: 83.8 fL (ref 80.0–100.0)
Monocytes Absolute: 0.5 10*3/uL (ref 0.1–1.0)
Monocytes Relative: 9 %
Neutro Abs: 3.9 10*3/uL (ref 1.7–7.7)
Neutrophils Relative %: 69 %
Platelets: 109 10*3/uL — ABNORMAL LOW (ref 150–400)
RBC: 4.13 MIL/uL (ref 3.87–5.11)
RDW: 14.1 % (ref 11.5–15.5)
WBC: 5.7 10*3/uL (ref 4.0–10.5)
nRBC: 0 % (ref 0.0–0.2)

## 2021-04-30 LAB — COMPREHENSIVE METABOLIC PANEL
ALT: 41 U/L (ref 0–44)
AST: 34 U/L (ref 15–41)
Albumin: 4.5 g/dL (ref 3.5–5.0)
Alkaline Phosphatase: 102 U/L (ref 38–126)
Anion gap: 10 (ref 5–15)
BUN: 28 mg/dL — ABNORMAL HIGH (ref 8–23)
CO2: 24 mmol/L (ref 22–32)
Calcium: 9.2 mg/dL (ref 8.9–10.3)
Chloride: 102 mmol/L (ref 98–111)
Creatinine, Ser: 1.11 mg/dL — ABNORMAL HIGH (ref 0.44–1.00)
GFR, Estimated: 56 mL/min — ABNORMAL LOW (ref >60)
Glucose, Bld: 145 mg/dL — ABNORMAL HIGH (ref 70–99)
Potassium: 4 mmol/L (ref 3.5–5.1)
Sodium: 136 mmol/L (ref 135–145)
Total Bilirubin: 0.5 mg/dL (ref 0.3–1.2)
Total Protein: 7.4 g/dL (ref 6.5–8.1)

## 2021-04-30 LAB — MAGNESIUM: Magnesium: 1.1 mg/dL — ABNORMAL LOW (ref 1.7–2.4)

## 2021-04-30 MED ORDER — SODIUM CHLORIDE 0.9% FLUSH
10.0000 mL | Freq: Once | INTRAVENOUS | Status: AC
  Administered 2021-04-30: 10 mL via INTRAVENOUS
  Filled 2021-04-30: qty 10

## 2021-04-30 MED ORDER — SODIUM CHLORIDE 0.9 % IV SOLN
6.0000 g | Freq: Once | INTRAVENOUS | Status: AC
  Administered 2021-04-30: 6 g via INTRAVENOUS
  Filled 2021-04-30: qty 10

## 2021-04-30 MED ORDER — HEPARIN SOD (PORK) LOCK FLUSH 100 UNIT/ML IV SOLN
500.0000 [IU] | Freq: Once | INTRAVENOUS | Status: AC
  Administered 2021-04-30: 500 [IU] via INTRAVENOUS
  Filled 2021-04-30: qty 5

## 2021-04-30 MED ORDER — SODIUM CHLORIDE 0.9 % IV SOLN
25.0000 mg | Freq: Once | INTRAVENOUS | Status: AC
  Administered 2021-04-30: 25 mg via INTRAVENOUS
  Filled 2021-04-30: qty 1

## 2021-04-30 MED ORDER — BORTEZOMIB CHEMO SQ INJECTION 3.5 MG (2.5MG/ML)
2.0000 mg | Freq: Once | INTRAMUSCULAR | Status: AC
  Administered 2021-04-30: 2 mg via SUBCUTANEOUS
  Filled 2021-04-30: qty 0.8

## 2021-04-30 NOTE — Progress Notes (Signed)
Hematology/Oncology [facility] note [facility] Telephone:([phone] [phone]     Clinic Day:  04/30/2021   Referring physician: Glean Hess, MD  Chief Complaint: Sarah [name] is a 65 y.o. 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   Initial bone marrow revealed 80% plasma cells.  Lambda free light chains were 1340.  She had nephrotic range proteinuria.  She initially underwent induction with RVD.  Revlimid maintenance was discontinued on 01/21/2017 secondary to intolerance.     07/19/2019 Bone marrow aspirate and biopsy on  revealed a normocellular marrow with but increased lambda-restricted plasma cells (9% aspirate, 40% CD138 immunohistochemistry).  Findings were consistent with recurrent plasma cell myeloma.  Flow cytometry revealed no monoclonal B-cell or phenotypically aberrant T-cell population. Cytogenetics were 52, XX (normal).  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    Lambda light chains have been followed: 22.2 (ratio 0.56) on 07/03/2017, 30.8 (ratio 0.78) on 09/02/2017, 36.9 (ratio 0.40) on 10/21/2017, 37.4 (ratio 0.41) on 12/16/2017, 70.7(ratio 0.31)  on 02/17/2018, 64.2 (ratio 0.27) on 04/07/2018, 78.9 (ratio 0.18) on 05/26/2018, 128.8 (ratio 0.17) on 08/06/2018, 181.5 (ratio 0.13) on 10/08/2018, 130.9 (ratio 0.13)  on 10/20/2018, 160.7 (ratio 0.10) on 12/09/2018, 236.6 (ratio 0.07) on 02/01/2019, 363.6 (ratio 0.04) on 03/22/2019, 404.8 (ratio 0.04) on 04/05/2019, 420.7 (ratio 0.03) on 05/24/2019, 573.4 (ratio 0.03) on 06/23/2019, 451.05 (ratio 0.02) on 08/20/2019, 47.2 (ratio 0.13) on 10/25/2019, 22.4 (ratio 0.21) on 11/25/2019, 16.5 (ratio 0.33) on 01/10/2020, 14.6 (ratio 0.34) on 02/07/2020, 13.1 (ratio 0.31) on 03/07/2020, 10.1 (ratio 0.38) on 04/10/2020, and 9.5 (ratio 0.21) on 06/19/2020.   24 hour UPEP on 06/03/2019 revealed kappa free light chains 95.76, lambda free light chains 1,260.71, and ratio 0.08.  24 hour UPEP on 08/23/2019 revealed total protein of 782 mg/24 hrs with lambda free light chains 1,084.16 mg/L and ratio of 0.10 (1.03-31.76).  M spike in urine was 46.1% (361 mg/24 hrs).    Bone survey on 04/08/2016 and 05/28/2017 revealed no definite lytic lesion seen in the visualized skeleton.  Bone survey on 11/19/2018 revealed no suspicious lucent lesions and no acute bony abnormality.  PET scan on 07/12/2019 revealed no focal metabolic activity to suggest active myeloma within the skeleton. There were no lytic lesions identified on the CT portion of the exam or soft tissue plasmacytomas. There was no evidence of multiple myeloma.    Pretreatment RBC phenotype on 09/23/2019 was positive for C, e, DUFFY B, KIDD B, M, S, and s antigen; negative for c, E, KELL, DUFFY A, KIDD A, and N antigen.    09/27/2019 - 10/25/2019; 12/09/2019 - 03/13/2020 6 cycles of daratumumab and hyaluronidase-fihj, Pomalyst, and Decadron (DPd) .  Cycle #1 was complicated by fever and neutropenia requiring admission.  Cycle #2 was complicated by pneumonia requiring admission.  Cycle #6 was complicated with an ER evaluation for an elevated lactic acid.   05/10/2020 second autologous stem cell transplant  at [facility]   She underwent conditioning with melphalan 140 mg/m2.    She is s/p week #3 Velcade maintenance (began 08/31/2020 -  09/28/2020).  She has no increase in neuropathy.  She has severe aortic stenosis.  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 Cardiology is following for a possible TAVR in the future.  Echo on 07/05/2020 revealed moderate to severe aortic stenosis with an EF of 50-55%.  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 Zometa was discontinued in 01/2017.  She has chronic nausea on Phenergan.     B12 deficiency.  B12 was 254 on 04/09/2017, 295 on 08/20/2018, and 391 on 10/08/2018.  She was on oral B12.  She received B12 monthly (last 09/14/2020).  Folate was 12.1 on 01/10/2020.    She has iron deficiency.  Ferritin was 32 on 07/01/2019.  She received Venofer on 07/15/2019 and 07/22/2019.   She has hypogammaglobulinemia.  IgG was 245 on 11/25/2019.  She received monthly IVIG (07/22/021 - 03/22/2020).  She received IVIG 400 mg/kg on 01/20/2020, 200 mg/kg on 02/17/2020, and 300 mg/kg on 03/22/2020.  IVIG on 16/04/9603 was complicated by acute renal failure. IgG trough level was 418 on 02/17/2020.  10/15/2019 - 10/20/2019 She was admitted to [facility] with fever and neutropenia.  Cultures were negative.  CXR was negative. She received broad spectrum antibiotics and daily Granix.  She received IVF for acute renal insufficiency due to diarrhea and dehydration.  Creatine was 1.66 on admission and 1.12 on discharge.    03/01/2020 - 03/05/2020 admitted to [facility] from with fever and neutropenia. CXR revealed no active cardiopulmonary disease. Chest CT with contrast revealed no acute intrathoracic pathology. There were findings which could be suggestive of prior granulomatous disease. She was treated with Cefepime and Vancomycin, then switched to ciprofloxacin on 03/03/2020.   05/08/2020 - 12/05/2021The patient was admitted to [facility] from  for autologous bone marrow transplant.   She received conditioning with melphalan 140 mg/m2.   Bone marrow transplant was on 05/10/2020. The patient developed fevers daily from 05/19/2020 - 05/24/2020. Blood cultures were + for strept sanguis bacteremia.  She was treated cefepime, vancomycin, and solumedrol for possible engraftment syndrome. Cefepime was switched to ceftriaxone; she completed 2 weeks of ceftriaxone on 06/03/2020. She had chemotherapy induced diarrhea.  She experienced urinary retention.  Feb 2022 patient has been on maintenance Velcade 2 mg every 2 weeks.  Chronic diarrhea seen by gastroenterology Dr. Vicente Males and was recommended to avoid artificial sugars in her diet including Diet Coke and coffee mate.  She feels some improvement of her diarrhea frequency since the dietary modification.  GI profile PCR negative, C. difficile toxin A+ B negative, fecal calprotectin 77 12/19/2020 colonoscopy showed 2 subcentimeter polyps in the ascending colon, resected and removed.  Pathology showed tubular adenoma.  No malignancy.  Normal mucosa in the entire examined colon.  Biopsied, pathology showed colonic mucosa with no significant pathological alteration.  Negative for active inflammation and features of chronicity.  Negative for microscopic colitis, dysplasia, and malignanc  Diabetes, patient has discontinued metformin due to diarrhea and started on glipizide 2.5 mg   03/05/2021-03/09/2021 Patient was hospitalized due to fever and chills, nonproductive cough, shortness of breath. X-ray showed right lower lobe atelectasis versus pneumonia.  Blood cultures negative.  Urine culture showed 60,000 colonies of Enterococcus facialis.  Patient received antibiotics. Patient also had abdominal pelvis CT scan for work-up of  intermittent lower abdomen pain.No acute abnormality.   INTERVAL HISTORY Sarah [name] is a 65 y.o. female who has above history reviewed by me today presents for follow up visit for management of multiple myeloma.  During the interval, she was seen by [facility].  Recommendation was to continue Velcade if neuropathy is worse, switch to Daratumumab.  She feels neuropathy is the same. She has appt with neurology Chronic diarrhea, unchanged.    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   Due to Zometa   Cardiomyopathy, idiopathic ([place]) 08/21/2017   Overview:  Septal and apical hypokinesis with ef 40%   CHF (congestive heart failure) (HCC)    CKD (chronic kidney disease) stage 3, GFR 30-59 ml/min (HCC)    Depression    Diabetes mellitus (HCC)    Dizziness    Fatty liver    Frequent falls    GERD (gastroesophageal reflux disease)    Gout    Heart murmur    History of blood transfusion    History of bone marrow transplant ([place])    History of uterine fibroid    Hx of cardiac catheterization 06/05/2016   Overview:  Normal coronaries 2017   Hypertension    Hypomagnesemia    IDA (iron deficiency anemia)    Multiple myeloma ([place])    Personal history of chemotherapy    Renal cyst    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[place]) 06/05/2016   Overview:  Sp ablation 1999    Past Surgical History:  Procedure Laterality Date   ABDOMINAL HYSTERECTOMY     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   CARDIAC ELECTROPHYSIOLOGY MAPPING AND ABLATION     CARPAL TUNNEL RELEASE Bilateral    CHOLECYSTECTOMY  2008   COLONOSCOPY WITH PROPOFOL N/A 05/07/2017   Procedure: COLONOSCOPY WITH PROPOFOL;  Surgeon: [name], MD;  Location: [facility] ENDOSCOPY;  Service: Gastroenterology;  Laterality: N/A;   COLONOSCOPY WITH PROPOFOL N/A 12/19/2020   Procedure: COLONOSCOPY WITH PROPOFOL;  Surgeon: [name], MD;  Location: [facility] ENDOSCOPY;  Service: Gastroenterology;  Laterality: N/A;   ESOPHAGOGASTRODUODENOSCOPY (EGD) WITH PROPOFOL N/A 05/07/2017   Procedure:  ESOPHAGOGASTRODUODENOSCOPY (EGD) WITH PROPOFOL;  Surgeon: [name], MD;  Location: [facility] ENDOSCOPY;  Service: Gastroenterology;  Laterality: N/A;   FOOT SURGERY Bilateral    INCONTINENCE SURGERY  2009   INTERSTIM IMPLANT PLACEMENT     other     over active bladder   OTHER SURGICAL HISTORY     bladder stimulator    PARTIAL HYSTERECTOMY  03/1996   fibroids   PORTA CATH INSERTION N/A 03/10/2019   Procedure: PORTA CATH INSERTION;  Surgeon: Algernon Huxley, MD;  Location: [facility] CV LAB;  Service: Cardiovascular;  Laterality: N/A;   TONSILLECTOMY  2007    Family History  Problem Relation Age of Onset   Colon cancer Father    Renal Disease Father    Diabetes Mellitus II Father    Melanoma Paternal Grandmother    Breast cancer Maternal Aunt 66   Anemia Mother    Heart disease Mother    Heart failure Mother    Renal Disease Mother    Congestive Heart Failure Mother    Heart disease Maternal Uncle    Throat cancer Maternal Uncle  Lung cancer Maternal Uncle    Liver disease Maternal Uncle    Heart failure Maternal Uncle    Hearing loss Son 64       Suicide     Social History:  reports that she quit smoking about 29 years ago. Her smoking use included cigarettes. She has a 20.00 pack-year smoking history. She has never used smokeless tobacco. She reports current alcohol use. She reports that she does not use drugs.  She is on disability. She notes exposure to perchloroethylene [facility]).  She lives much of her adult life in [place].   Her husband passed away.    Allergies:  Allergies  Allergen Reactions   Oxycodone-Acetaminophen Anaphylaxis    Swelling and rash   Celebrex [Celecoxib] Diarrhea   Codeine    Plerixafor     In 2016 during ASCT collection patient developed fever to 103.40F and required hospitalization   Benadryl [Diphenhydramine] Palpitations   Morphine Itching and Rash   Ondansetron Diarrhea   Tylenol [Acetaminophen] Itching and Rash    Current  Medications: Current Outpatient Medications  Medication Sig Dispense Refill   acetaminophen (TYLENOL) 500 MG tablet Take 500 mg by mouth every 6 (six) hours as needed.     acyclovir (ZOVIRAX) 400 MG tablet Take 1 tablet (400 mg total) by mouth 2 (two) times daily. 180 tablet 1   albuterol (VENTOLIN HFA) 108 (90 Base) MCG/ACT inhaler Inhale 2 puffs into the lungs every 6 (six) hours as needed for wheezing or shortness of breath. 8 g 0   bisoprolol (ZEBETA) 5 MG tablet Take 5 mg by mouth daily.     FLUoxetine (PROZAC) 40 MG capsule TAKE 1 CAPSULE EVERY DAY 90 capsule 0   gabapentin (NEURONTIN) 300 MG capsule Take 1 capsule (300 mg total) by mouth 2 (two) times daily. 60 capsule 0   glucose blood test strip      Multiple Vitamin (MULTIVITAMIN) tablet Take 1 tablet by mouth daily.     Multiple Vitamins-Minerals (HAIR/SKIN/NAILS) TABS Take 1 tablet by mouth daily.     omeprazole (PRILOSEC) 40 MG capsule Take 1 capsule (40 mg total) by mouth in the morning and at bedtime. 180 capsule 1   sucralfate (CARAFATE) 1 g tablet Take 1 tablet (1 g total) by mouth 4 (four) times daily -  with meals and at bedtime. 120 tablet 2   tiZANidine (ZANAFLEX) 2 MG tablet TAKE 1 TABLET(2 MG) BY MOUTH EVERY 6 HOURS AS NEEDED FOR MUSCLE SPASMS 30 tablet 0   traZODone (DESYREL) 100 MG tablet Take 1 tablet (100 mg total) by mouth at bedtime. 90 tablet 1   allopurinol (ZYLOPRIM) 100 MG tablet TAKE 1 TABLET(100 MG) BY MOUTH DAILY as needed (Patient not taking: No sig reported) 90 tablet 1   diclofenac sodium (VOLTAREN) 1 % GEL Apply 2 g topically 4 (four) times daily. (Patient not taking: No sig reported) 100 g 1   diphenhydrAMINE (BENADRYL) 50 MG capsule Take 50 mg by mouth as needed. (Patient not taking: No sig reported)     diphenoxylate-atropine (LOMOTIL) 2.5-0.025 MG tablet Take 2 tablets by mouth 4 (four) times daily as needed for diarrhea or loose stools. (Patient not taking: No sig reported) 64 tablet 3   fluticasone  (FLONASE) 50 MCG/ACT nasal spray Place 2 sprays into both nostrils daily. (Patient not taking: No sig reported) 16 g 2   promethazine (PHENERGAN) 25 MG tablet Take 1 tablet (25 mg total) by mouth every 6 (six) hours as needed  for nausea or vomiting. (Patient not taking: No sig reported) 90 tablet 1   promethazine-dextromethorphan (PROMETHAZINE-DM) 6.25-15 MG/5ML syrup Take 5 mLs by mouth 4 (four) times daily as needed for cough. (Patient not taking: No sig reported) 180 mL 0   No current facility-administered medications for this visit.   Facility-Administered Medications Ordered in Other Visits  Medication Dose Route Frequency Provider Last Rate Last Admin   bortezomib SQ (VELCADE) chemo injection (2.44m/mL concentration) 2 mg  2 mg Subcutaneous Once YEarlie Server MD       heparin lock flush 100 unit/mL  500 Units Intravenous Once YEarlie Server MD       sodium chloride flush (NS) 0.9 % injection 10 mL  10 mL Intravenous PRN YEarlie Server MD   10 mL at 04/02/21 01740   Review of Systems  Constitutional:  Negative for chills, fever, malaise/fatigue and weight loss.  HENT:  Negative for sore throat.   Eyes:  Negative for redness.  Respiratory:  Negative for cough, shortness of breath and wheezing.   Cardiovascular:  Negative for chest pain, palpitations and leg swelling.  Gastrointestinal:  Positive for diarrhea and nausea. Negative for abdominal pain, blood in stool and vomiting.  Genitourinary:  Negative for dysuria.  Musculoskeletal:  Positive for back pain and joint pain. Negative for myalgias.  Skin:  Negative for rash.  Neurological:  Positive for tingling and sensory change. Negative for dizziness and tremors.  Endo/Heme/Allergies:  Does not bruise/bleed easily.  Psychiatric/Behavioral:  Negative for hallucinations.    Performance status (ECOG): 1  Vitals Blood pressure (!) 154/89, pulse 77, temperature (!) 96.6 F (35.9 C), resp. rate 18, weight 160 lb 11.2 oz (72.9 kg).  Physical  Exam Vitals and nursing note reviewed.  Constitutional:      General: She is not in acute distress.    Appearance: She is well-developed. She is not ill-appearing, toxic-appearing or diaphoretic.     Interventions: Face mask in place.  HENT:     Head: Normocephalic and atraumatic.     Mouth/Throat:     Mouth: Mucous membranes are moist.     Pharynx: Oropharynx is clear.  Eyes:     General: No scleral icterus.    Pupils: Pupils are equal, round, and reactive to light.  Cardiovascular:     Rate and Rhythm: Normal rate and regular rhythm.     Heart sounds: Normal heart sounds. No murmur heard. Pulmonary:     Effort: Pulmonary effort is normal. No respiratory distress.     Breath sounds: Normal breath sounds. No wheezing or rales.  Chest:     Chest wall: No tenderness.  Abdominal:     General: Bowel sounds are normal. There is no distension.     Palpations: Abdomen is soft. There is no hepatomegaly, splenomegaly or mass.     Tenderness: There is no abdominal tenderness. There is no guarding or rebound.  Musculoskeletal:        General: No tenderness. Normal range of motion.     Cervical back: Normal range of motion and neck supple.     Right lower leg: No edema.     Left lower leg: No edema.  Lymphadenopathy:     Head:     Right side of head: No preauricular, posterior auricular or occipital adenopathy.     Left side of head: No preauricular, posterior auricular or occipital adenopathy.     Cervical: No cervical adenopathy.     Upper Body:  Right upper body: No supraclavicular adenopathy.     Left upper body: No supraclavicular adenopathy.  Skin:    General: Skin is warm and dry.  Neurological:     Mental Status: She is alert and oriented to person, place, and time. Mental status is at baseline.  Psychiatric:        Mood and Affect: Mood normal.   Laboratory data Infusion on 04/30/2021  Component Date Value Ref Range Status   Magnesium 04/30/2021 1.1 (A)  1.7 - 2.4  mg/dL Final   Performed at [facility], [place], [place] [address]   Sodium 04/30/2021 136  135 - 145 mmol/L Final   Potassium 04/30/2021 4.0  3.5 - 5.1 mmol/L Final   Chloride 04/30/2021 102  98 - 111 mmol/L Final   CO2 04/30/2021 24  22 - 32 mmol/L Final   Glucose, Bld 04/30/2021 145 (A)  70 - 99 mg/dL Final   Glucose reference range applies only to samples taken after fasting for at least 8 hours.   BUN 04/30/2021 28 (A)  8 - 23 mg/dL Final   Creatinine, Ser 04/30/2021 1.11 (A)  0.44 - 1.00 mg/dL Final   Calcium 04/30/2021 9.2  8.9 - 10.3 mg/dL Final   Total Protein 04/30/2021 7.4  6.5 - 8.1 g/dL Final   Albumin 04/30/2021 4.5  3.5 - 5.0 g/dL Final   AST 04/30/2021 34  15 - 41 U/L Final   ALT 04/30/2021 41  0 - 44 U/L Final   Alkaline Phosphatase 04/30/2021 102  38 - 126 U/L Final   Total Bilirubin 04/30/2021 0.5  0.3 - 1.2 mg/dL Final   GFR, Estimated 04/30/2021 56 (A)  >60 mL/min Final   Comment: (NOTE) Calculated using the CKD-EPI Creatinine Equation (2021)    Anion gap 04/30/2021 10  5 - 15 Final   Performed at [facility], [place], [place] [address]   WBC 04/30/2021 5.7  4.0 - 10.5 K/uL Final   RBC 04/30/2021 4.13  3.87 - 5.11 MIL/uL Final   Hemoglobin 04/30/2021 11.5 (A)  12.0 - 15.0 g/dL Final   HCT 04/30/2021 34.6 (A)  36.0 - 46.0 % Final   MCV 04/30/2021 83.8  80.0 - 100.0 fL Final   MCH 04/30/2021 27.8  26.0 - 34.0 pg Final   MCHC 04/30/2021 33.2  30.0 - 36.0 g/dL Final   RDW 04/30/2021 14.1  11.5 - 15.5 % Final   Platelets 04/30/2021 109 (A)  150 - 400 K/uL Final   nRBC 04/30/2021 0.0  0.0 - 0.2 % Final   Neutrophils Relative % 04/30/2021 69  % Final   Neutro Abs 04/30/2021 3.9  1.7 - 7.7 K/uL Final   Lymphocytes Relative 04/30/2021 20  % Final   Lymphs Abs 04/30/2021 1.2  0.7 - 4.0 K/uL Final   Monocytes Relative 04/30/2021 9  % Final   Monocytes Absolute 04/30/2021 0.5  0.1 - 1.0 K/uL Final   Eosinophils Relative  04/30/2021 2  % Final   Eosinophils Absolute 04/30/2021 0.1  0.0 - 0.5 K/uL Final   Basophils Relative 04/30/2021 0  % Final   Basophils Absolute 04/30/2021 0.0  0.0 - 0.1 K/uL Final   Immature Granulocytes 04/30/2021 0  % Final   Abs Immature Granulocytes 04/30/2021 0.02  0.00 - 0.07 K/uL Final   Performed at [facility], [address], [place], [place] [address]    Assessment/Plan 1. Encounter for antineoplastic chemotherapy   2.  Multiple myeloma in remission ([place])   3. Hypomagnesemia   4. Neuropathy   5. 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[place])     # Recurrent lambda light chain multiple myeloma s/p second autologous bone marrow transplant [05/10/2020] Labs reviewed and discussed with patient. Multiple myeloma showed negative M protein.  Light chain ratio is normal. 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  Attention on follow-up. Labs are reviewed and discussed with patient. Proceed with Velcade today.   Continue prophylaxis with Valtrex up to 1 year after transplant-November 2022 Vaccination at [facility] transplant RN coordinator [phone]).  #Hyperglobulinemia post stem cell transplantation.  May consider  IVIG replacement therapy if she has frequent infections. .-Her IgG levels are normal.  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   She agrees with trying gabapentin. Start with 320m daily, titrate to 3074mBID if needed. To see neurologist. Refer to acupuncture clinic.   # iron deficiency anemia, Hemoglobin 11.5  #chronic diarrhea, Gastroenterology recommendation was reviewed.  Avoid artificial sugar.  Colonoscopy showed no microscopic colitis. Her symptom seems to have improved slightly after dietary modification. Velcade side effects may also  contribute to diarrhea  #Chronic hypomagnesemia Magnesium is 1.1 today, proceed with  IV magnesium today. 6 g Off Slow-Mag 64 mg twice daily.  Per GI recommendation as this potentially precipitate her diarrhea. continue magnesium checked twice a week +/- IV magnesium  #Dysphagia, chronic nausea, patient request to have IV Phenergan in the clinic with treatment.    # B12 deficiency, patient has been on vitamin B12 monthly. Last dose 11/09/2020. Vitamin B12 is 404 continue B12 injections monthly.   RTC: 1 week, lab magnesium/IV magnesium  RTC in 2 weeks for labs (CBC with diff, CMP, Mg ), md  for  Velcade and IV Mg   I discussed the assessment and treatment plan with the patient.  The patient was provided an opportunity to ask questions and all were answered.  The patient agreed with the plan and demonstrated an understanding of the instructions.  The patient was advised to call back if the symptoms worsen or if the condition fails to improve as anticipated.   ZhEarlie ServerMD, PhD 04/30/2021

## 2021-04-30 NOTE — Progress Notes (Signed)
Pt here for follow up. Reports redness to injection site.

## 2021-04-30 NOTE — Patient Instructions (Signed)
[facility] [place] [facility]  Discharge Instructions: Thank you for choosing [facility] to provide your oncology and hematology care.  If you have a lab appointment with the Cancer Center, please go directly to the Cancer Center and check in at the registration area.  Wear comfortable clothing and clothing appropriate for easy access to any Portacath or PICC line.   We strive to give you quality time with your provider. You may need to reschedule your appointment if you arrive late (15 or more minutes).  Arriving late affects you and other patients whose appointments are after yours.  Also, if you miss three or more appointments without notifying the office, you may be dismissed from the clinic at the provider's discretion.      For prescription refill requests, have your pharmacy contact our office and allow 72 hours for refills to be completed.    Today you received the following chemotherapy and/or immunotherapy agents       To help prevent nausea and vomiting after your treatment, we encourage you to take your nausea medication as directed.  BELOW ARE SYMPTOMS THAT SHOULD BE REPORTED IMMEDIATELY: *FEVER GREATER THAN 100.4 F (38 C) OR HIGHER *CHILLS OR SWEATING *NAUSEA AND VOMITING THAT IS NOT CONTROLLED WITH YOUR NAUSEA MEDICATION *UNUSUAL SHORTNESS OF BREATH *UNUSUAL BRUISING OR BLEEDING *URINARY PROBLEMS (pain or burning when urinating, or frequent urination) *BOWEL PROBLEMS (unusual diarrhea, constipation, pain near the anus) TENDERNESS IN MOUTH AND THROAT WITH OR WITHOUT PRESENCE OF ULCERS (sore throat, sores in mouth, or a toothache) UNUSUAL RASH, SWELLING OR PAIN  UNUSUAL VAGINAL DISCHARGE OR ITCHING   Items with * indicate a potential emergency and should be followed up as soon as possible or go to the Emergency Department if any problems should occur.  Please show the CHEMOTHERAPY ALERT CARD or IMMUNOTHERAPY ALERT CARD at check-in to the  Emergency Department and triage nurse.  Should you have questions after your visit or need to cancel or reschedule your appointment, please contact [facility] [place] [facility]  [phone] and follow the prompts.  Office hours are 8:00 a.m. to 4:30 p.m. Monday - Friday. Please note that voicemails left after 4:00 p.m. may not be returned until the following business day.  We are closed weekends and major holidays. You have access to a nurse at all times for urgent questions. Please call the main number to the clinic [phone] and follow the prompts.  For any non-urgent questions, you may also contact your provider using MyChart. We now offer e-Visits for anyone 18 and older to request care online for non-urgent symptoms. For details visit [email or web address].   Also download the MyChart app! Go to the app store, search "MyChart", open the app, select [facility], and log in with your MyChart username and password.  Due to Covid, a mask is required upon entering the hospital/clinic. If you do not have a mask, one will be given to you upon arrival. For doctor visits, patients may have 1 support person aged 18 or older with them. For treatment visits, patients cannot have anyone with them due to current Covid guidelines and our immunocompromised population.  

## 2021-04-30 NOTE — Telephone Encounter (Signed)
Requested Prescriptions  Pending Prescriptions Disp Refills  . traZODone (DESYREL) 100 MG tablet [Pharmacy Med Name: TRAZODONE HYDROCHLORIDE 100 MG Tablet] 90 tablet 0    Sig: TAKE 1 TABLET AT BEDTIME     Psychiatry: Antidepressants - Serotonin Modulator Passed - 04/30/2021  3:41 AM      Passed - Completed PHQ-2 or PHQ-9 in the last 360 days      Passed - Valid encounter within last 6 months    Recent Outpatient Visits          1 month ago Pneumonia of right lower lobe due to infectious organism   [facility] Glean Hess, MD   4 months ago Annual physical exam   [facility] Glean Hess, MD   1 year ago Benign essential HTN   [facility] Glean Hess, MD   1 year ago Annual physical exam   [facility] Glean Hess, MD   1 year ago Benign essential HTN   [facility] Glean Hess, MD      Future Appointments            In 7 months [facility] Melia Jesse Sans, MD [facility], [facility]

## 2021-05-01 ENCOUNTER — Telehealth: Payer: Self-pay

## 2021-05-01 LAB — KAPPA/LAMBDA LIGHT CHAINS
Kappa free light chain: 16.8 mg/L (ref 3.3–19.4)
Kappa, lambda light chain ratio: 1.19 (ref 0.26–1.65)
Lambda free light chains: 14.1 mg/L (ref 5.7–26.3)

## 2021-05-01 NOTE — Telephone Encounter (Signed)
-----   Message from Theodore Demark, RN sent at 05/01/2021  7:34 AM EDT ----- Regarding: RE: acupuncture Sending approval to [place] today. Thank you!!! ----- Message ----- From: Evelina Dun, RN Sent: 04/30/2021  11:11 AM EDT To: Evelina Dun, RN, Theodore Demark, RN, # Subject: acupuncture                                    Evern Core,   Will you send a referral for Acupuncture on this pt please. No provider preference.   Thanks,  Benjamine Mola

## 2021-05-03 ENCOUNTER — Other Ambulatory Visit: Payer: Self-pay | Admitting: Internal Medicine

## 2021-05-03 DIAGNOSIS — G620 Drug-induced polyneuropathy: Secondary | ICD-10-CM | POA: Diagnosis not present

## 2021-05-03 DIAGNOSIS — M25551 Pain in right hip: Secondary | ICD-10-CM

## 2021-05-03 DIAGNOSIS — R200 Anesthesia of skin: Secondary | ICD-10-CM | POA: Diagnosis not present

## 2021-05-03 DIAGNOSIS — R202 Paresthesia of skin: Secondary | ICD-10-CM | POA: Diagnosis not present

## 2021-05-03 DIAGNOSIS — M542 Cervicalgia: Secondary | ICD-10-CM | POA: Diagnosis not present

## 2021-05-03 DIAGNOSIS — M545 Low back pain, unspecified: Secondary | ICD-10-CM | POA: Diagnosis not present

## 2021-05-03 DIAGNOSIS — T451X5A Adverse effect of antineoplastic and immunosuppressive drugs, initial encounter: Secondary | ICD-10-CM | POA: Diagnosis not present

## 2021-05-03 LAB — MULTIPLE MYELOMA PANEL, SERUM
Albumin SerPl Elph-Mcnc: 3.5 g/dL (ref 2.9–4.4)
Albumin/Glob SerPl: 1.4 (ref 0.7–1.7)
Alpha 1: 0.2 g/dL (ref 0.0–0.4)
Alpha2 Glob SerPl Elph-Mcnc: 0.9 g/dL (ref 0.4–1.0)
B-Globulin SerPl Elph-Mcnc: 0.9 g/dL (ref 0.7–1.3)
Gamma Glob SerPl Elph-Mcnc: 0.6 g/dL (ref 0.4–1.8)
Globulin, Total: 2.6 g/dL (ref 2.2–3.9)
IgA: 85 mg/dL — ABNORMAL LOW (ref 87–352)
IgG (Immunoglobin G), Serum: 684 mg/dL (ref 586–1602)
IgM (Immunoglobulin M), Srm: 62 mg/dL (ref 26–217)
Total Protein ELP: 6.1 g/dL (ref 6.0–8.5)

## 2021-05-03 NOTE — Telephone Encounter (Signed)
Requested medications are due for refill today filled 04/20/21 for 30  Requested medications are on the active medication list yes  Last refill 04/20/21  Last visit seen 03/13/21, yearly PE 12/05/20  Future visit scheduled YES 12/07/21  Notes to clinic Not delegated.  Requested Prescriptions  Pending Prescriptions Disp Refills   tiZANidine (ZANAFLEX) 2 MG tablet [Pharmacy Med Name: TIZANIDINE 2MG  TABLETS] 30 tablet 0    Sig: TAKE 1 TABLET BY MOUTH EVERY 6 HOURS AS NEEDED FOR MUSCLE SPASMS     Not Delegated - Cardiovascular:  Alpha-2 Agonists - tizanidine Failed - 05/03/2021  2:46 PM      Failed - This refill cannot be delegated      Passed - Valid encounter within last 6 months    Recent Outpatient Visits           1 month ago Pneumonia of right lower lobe due to infectious organism   [facility] Glean Hess, MD   4 months ago Annual physical exam   [facility] Glean Hess, MD   1 year ago Benign essential HTN   [facility] Glean Hess, MD   1 year ago Annual physical exam   [facility] Glean Hess, MD   1 year ago Benign essential HTN   [facility] Glean Hess, MD       Future Appointments             In 7 months Army Melia Jesse Sans, MD [facility], [facility]

## 2021-05-04 ENCOUNTER — Encounter: Payer: Self-pay | Admitting: Hematology and Oncology

## 2021-05-04 ENCOUNTER — Encounter: Payer: Self-pay | Admitting: Internal Medicine

## 2021-05-04 NOTE — Telephone Encounter (Signed)
See previous note on 10/6 

## 2021-05-07 ENCOUNTER — Other Ambulatory Visit: Payer: Self-pay

## 2021-05-07 ENCOUNTER — Inpatient Hospital Stay: Payer: Medicare HMO

## 2021-05-07 ENCOUNTER — Inpatient Hospital Stay: Payer: Medicare HMO | Attending: Oncology

## 2021-05-07 DIAGNOSIS — Z886 Allergy status to analgesic agent status: Secondary | ICD-10-CM | POA: Insufficient documentation

## 2021-05-07 DIAGNOSIS — Z9481 Bone marrow transplant status: Secondary | ICD-10-CM | POA: Insufficient documentation

## 2021-05-07 DIAGNOSIS — Z885 Allergy status to narcotic agent status: Secondary | ICD-10-CM | POA: Insufficient documentation

## 2021-05-07 DIAGNOSIS — D509 Iron deficiency anemia, unspecified: Secondary | ICD-10-CM | POA: Diagnosis not present

## 2021-05-07 DIAGNOSIS — Z79899 Other long term (current) drug therapy: Secondary | ICD-10-CM | POA: Diagnosis not present

## 2021-05-07 DIAGNOSIS — Z822 Family history of deafness and hearing loss: Secondary | ICD-10-CM | POA: Insufficient documentation

## 2021-05-07 DIAGNOSIS — N179 Acute kidney failure, unspecified: Secondary | ICD-10-CM | POA: Insufficient documentation

## 2021-05-07 DIAGNOSIS — G620 Drug-induced polyneuropathy: Secondary | ICD-10-CM | POA: Insufficient documentation

## 2021-05-07 DIAGNOSIS — Z8379 Family history of other diseases of the digestive system: Secondary | ICD-10-CM | POA: Insufficient documentation

## 2021-05-07 DIAGNOSIS — Z841 Family history of disorders of kidney and ureter: Secondary | ICD-10-CM | POA: Insufficient documentation

## 2021-05-07 DIAGNOSIS — Z9049 Acquired absence of other specified parts of digestive tract: Secondary | ICD-10-CM | POA: Insufficient documentation

## 2021-05-07 DIAGNOSIS — E1122 Type 2 diabetes mellitus with diabetic chronic kidney disease: Secondary | ICD-10-CM | POA: Diagnosis not present

## 2021-05-07 DIAGNOSIS — C9000 Multiple myeloma not having achieved remission: Secondary | ICD-10-CM

## 2021-05-07 DIAGNOSIS — R197 Diarrhea, unspecified: Secondary | ICD-10-CM | POA: Diagnosis not present

## 2021-05-07 DIAGNOSIS — M549 Dorsalgia, unspecified: Secondary | ICD-10-CM | POA: Insufficient documentation

## 2021-05-07 DIAGNOSIS — Z8249 Family history of ischemic heart disease and other diseases of the circulatory system: Secondary | ICD-10-CM | POA: Insufficient documentation

## 2021-05-07 DIAGNOSIS — Z9484 Stem cells transplant status: Secondary | ICD-10-CM | POA: Insufficient documentation

## 2021-05-07 DIAGNOSIS — Z808 Family history of malignant neoplasm of other organs or systems: Secondary | ICD-10-CM | POA: Insufficient documentation

## 2021-05-07 DIAGNOSIS — Z888 Allergy status to other drugs, medicaments and biological substances status: Secondary | ICD-10-CM | POA: Insufficient documentation

## 2021-05-07 DIAGNOSIS — M2550 Pain in unspecified joint: Secondary | ICD-10-CM | POA: Insufficient documentation

## 2021-05-07 DIAGNOSIS — I129 Hypertensive chronic kidney disease with stage 1 through stage 4 chronic kidney disease, or unspecified chronic kidney disease: Secondary | ICD-10-CM | POA: Insufficient documentation

## 2021-05-07 DIAGNOSIS — Z5112 Encounter for antineoplastic immunotherapy: Secondary | ICD-10-CM | POA: Insufficient documentation

## 2021-05-07 DIAGNOSIS — E538 Deficiency of other specified B group vitamins: Secondary | ICD-10-CM | POA: Diagnosis not present

## 2021-05-07 DIAGNOSIS — Z87891 Personal history of nicotine dependence: Secondary | ICD-10-CM | POA: Insufficient documentation

## 2021-05-07 DIAGNOSIS — N183 Chronic kidney disease, stage 3 unspecified: Secondary | ICD-10-CM | POA: Diagnosis not present

## 2021-05-07 DIAGNOSIS — F32A Depression, unspecified: Secondary | ICD-10-CM | POA: Diagnosis not present

## 2021-05-07 DIAGNOSIS — R1310 Dysphagia, unspecified: Secondary | ICD-10-CM | POA: Insufficient documentation

## 2021-05-07 DIAGNOSIS — Z800 Family history of malignant neoplasm of digestive organs: Secondary | ICD-10-CM | POA: Insufficient documentation

## 2021-05-07 DIAGNOSIS — C9001 Multiple myeloma in remission: Secondary | ICD-10-CM | POA: Diagnosis not present

## 2021-05-07 DIAGNOSIS — T451X5A Adverse effect of antineoplastic and immunosuppressive drugs, initial encounter: Secondary | ICD-10-CM | POA: Diagnosis not present

## 2021-05-07 DIAGNOSIS — Z833 Family history of diabetes mellitus: Secondary | ICD-10-CM | POA: Insufficient documentation

## 2021-05-07 DIAGNOSIS — R110 Nausea: Secondary | ICD-10-CM | POA: Insufficient documentation

## 2021-05-07 DIAGNOSIS — Z803 Family history of malignant neoplasm of breast: Secondary | ICD-10-CM | POA: Insufficient documentation

## 2021-05-07 DIAGNOSIS — Z801 Family history of malignant neoplasm of trachea, bronchus and lung: Secondary | ICD-10-CM | POA: Insufficient documentation

## 2021-05-07 DIAGNOSIS — Z832 Family history of diseases of the blood and blood-forming organs and certain disorders involving the immune mechanism: Secondary | ICD-10-CM | POA: Insufficient documentation

## 2021-05-07 LAB — CBC WITH DIFFERENTIAL/PLATELET
Abs Immature Granulocytes: 0.02 10*3/uL (ref 0.00–0.07)
Basophils Absolute: 0 10*3/uL (ref 0.0–0.1)
Basophils Relative: 0 %
Eosinophils Absolute: 0.1 10*3/uL (ref 0.0–0.5)
Eosinophils Relative: 1 %
HCT: 34.1 % — ABNORMAL LOW (ref 36.0–46.0)
Hemoglobin: 11.5 g/dL — ABNORMAL LOW (ref 12.0–15.0)
Immature Granulocytes: 0 %
Lymphocytes Relative: 24 %
Lymphs Abs: 1.3 10*3/uL (ref 0.7–4.0)
MCH: 28.1 pg (ref 26.0–34.0)
MCHC: 33.7 g/dL (ref 30.0–36.0)
MCV: 83.4 fL (ref 80.0–100.0)
Monocytes Absolute: 0.4 10*3/uL (ref 0.1–1.0)
Monocytes Relative: 7 %
Neutro Abs: 3.8 10*3/uL (ref 1.7–7.7)
Neutrophils Relative %: 68 %
Platelets: 106 10*3/uL — ABNORMAL LOW (ref 150–400)
RBC: 4.09 MIL/uL (ref 3.87–5.11)
RDW: 13.7 % (ref 11.5–15.5)
WBC: 5.6 10*3/uL (ref 4.0–10.5)
nRBC: 0 % (ref 0.0–0.2)

## 2021-05-07 LAB — COMPREHENSIVE METABOLIC PANEL
ALT: 46 U/L — ABNORMAL HIGH (ref 0–44)
AST: 38 U/L (ref 15–41)
Albumin: 4.4 g/dL (ref 3.5–5.0)
Alkaline Phosphatase: 99 U/L (ref 38–126)
Anion gap: 10 (ref 5–15)
BUN: 27 mg/dL — ABNORMAL HIGH (ref 8–23)
CO2: 22 mmol/L (ref 22–32)
Calcium: 9.4 mg/dL (ref 8.9–10.3)
Chloride: 105 mmol/L (ref 98–111)
Creatinine, Ser: 1.09 mg/dL — ABNORMAL HIGH (ref 0.44–1.00)
GFR, Estimated: 57 mL/min — ABNORMAL LOW (ref >60)
Glucose, Bld: 194 mg/dL — ABNORMAL HIGH (ref 70–99)
Potassium: 4.1 mmol/L (ref 3.5–5.1)
Sodium: 137 mmol/L (ref 135–145)
Total Bilirubin: 0.4 mg/dL (ref 0.3–1.2)
Total Protein: 7.2 g/dL (ref 6.5–8.1)

## 2021-05-07 LAB — MAGNESIUM: Magnesium: 1.3 mg/dL — ABNORMAL LOW (ref 1.7–2.4)

## 2021-05-07 MED ORDER — MAGNESIUM SULFATE 4 GM/100ML IV SOLN: 4.0000 g | Freq: Once | INTRAVENOUS | Status: DC

## 2021-05-07 MED ORDER — SODIUM CHLORIDE 0.9 % IV SOLN: 6.0000 g | Freq: Once | INTRAVENOUS | Status: DC

## 2021-05-07 MED ORDER — MAGNESIUM SULFATE 4 GM/100ML IV SOLN
4.0000 g | Freq: Once | INTRAVENOUS | Status: AC
  Administered 2021-05-07: 4 g via INTRAVENOUS
  Filled 2021-05-07: qty 100

## 2021-05-07 MED ORDER — MAGNESIUM SULFATE 2 GM/50ML IV SOLN: 2.0000 g | Freq: Once | INTRAVENOUS | Status: DC

## 2021-05-07 MED ORDER — SODIUM CHLORIDE 0.9 % IV SOLN
Freq: Once | INTRAVENOUS | Status: DC
  Filled 2021-05-07: qty 250

## 2021-05-07 MED ORDER — HEPARIN SOD (PORK) LOCK FLUSH 100 UNIT/ML IV SOLN
INTRAVENOUS | Status: AC
  Filled 2021-05-07: qty 5

## 2021-05-07 MED ORDER — MAGNESIUM SULFATE 2 GM/50ML IV SOLN
2.0000 g | Freq: Once | INTRAVENOUS | Status: AC
  Administered 2021-05-07: 2 g via INTRAVENOUS
  Filled 2021-05-07: qty 50

## 2021-05-10 ENCOUNTER — Other Ambulatory Visit: Payer: Self-pay | Admitting: Neurology

## 2021-05-10 DIAGNOSIS — R200 Anesthesia of skin: Secondary | ICD-10-CM

## 2021-05-10 DIAGNOSIS — R202 Paresthesia of skin: Secondary | ICD-10-CM

## 2021-05-14 ENCOUNTER — Inpatient Hospital Stay (HOSPITAL_BASED_OUTPATIENT_CLINIC_OR_DEPARTMENT_OTHER): Payer: Medicare HMO | Admitting: Oncology

## 2021-05-14 ENCOUNTER — Inpatient Hospital Stay: Payer: Medicare HMO

## 2021-05-14 ENCOUNTER — Other Ambulatory Visit: Payer: Self-pay

## 2021-05-14 ENCOUNTER — Encounter: Payer: Self-pay | Admitting: Oncology

## 2021-05-14 VITALS — BP 136/82 | HR 72 | Temp 98.2°F | Resp 18 | Wt 163.2 lb

## 2021-05-14 DIAGNOSIS — Z5111 Encounter for antineoplastic chemotherapy: Secondary | ICD-10-CM | POA: Diagnosis not present

## 2021-05-14 DIAGNOSIS — E538 Deficiency of other specified B group vitamins: Secondary | ICD-10-CM | POA: Diagnosis not present

## 2021-05-14 DIAGNOSIS — N183 Chronic kidney disease, stage 3 unspecified: Secondary | ICD-10-CM | POA: Diagnosis not present

## 2021-05-14 DIAGNOSIS — T451X5A Adverse effect of antineoplastic and immunosuppressive drugs, initial encounter: Secondary | ICD-10-CM | POA: Diagnosis not present

## 2021-05-14 DIAGNOSIS — G620 Drug-induced polyneuropathy: Secondary | ICD-10-CM | POA: Diagnosis not present

## 2021-05-14 DIAGNOSIS — D649 Anemia, unspecified: Secondary | ICD-10-CM

## 2021-05-14 DIAGNOSIS — E118 Type 2 diabetes mellitus with unspecified complications: Secondary | ICD-10-CM

## 2021-05-14 DIAGNOSIS — C9001 Multiple myeloma in remission: Secondary | ICD-10-CM | POA: Diagnosis not present

## 2021-05-14 DIAGNOSIS — F32A Depression, unspecified: Secondary | ICD-10-CM | POA: Diagnosis not present

## 2021-05-14 DIAGNOSIS — C9000 Multiple myeloma not having achieved remission: Secondary | ICD-10-CM

## 2021-05-14 DIAGNOSIS — E1122 Type 2 diabetes mellitus with diabetic chronic kidney disease: Secondary | ICD-10-CM | POA: Diagnosis not present

## 2021-05-14 DIAGNOSIS — I129 Hypertensive chronic kidney disease with stage 1 through stage 4 chronic kidney disease, or unspecified chronic kidney disease: Secondary | ICD-10-CM | POA: Diagnosis not present

## 2021-05-14 DIAGNOSIS — Z5112 Encounter for antineoplastic immunotherapy: Secondary | ICD-10-CM | POA: Diagnosis not present

## 2021-05-14 DIAGNOSIS — D509 Iron deficiency anemia, unspecified: Secondary | ICD-10-CM | POA: Diagnosis not present

## 2021-05-14 LAB — CBC WITH DIFFERENTIAL/PLATELET
Abs Immature Granulocytes: 0.03 10*3/uL (ref 0.00–0.07)
Basophils Absolute: 0 10*3/uL (ref 0.0–0.1)
Basophils Relative: 1 %
Eosinophils Absolute: 0.1 10*3/uL (ref 0.0–0.5)
Eosinophils Relative: 2 %
HCT: 35.6 % — ABNORMAL LOW (ref 36.0–46.0)
Hemoglobin: 11.7 g/dL — ABNORMAL LOW (ref 12.0–15.0)
Immature Granulocytes: 1 %
Lymphocytes Relative: 21 %
Lymphs Abs: 1 10*3/uL (ref 0.7–4.0)
MCH: 27.5 pg (ref 26.0–34.0)
MCHC: 32.9 g/dL (ref 30.0–36.0)
MCV: 83.6 fL (ref 80.0–100.0)
Monocytes Absolute: 0.4 10*3/uL (ref 0.1–1.0)
Monocytes Relative: 9 %
Neutro Abs: 3.2 10*3/uL (ref 1.7–7.7)
Neutrophils Relative %: 66 %
Platelets: 118 10*3/uL — ABNORMAL LOW (ref 150–400)
RBC: 4.26 MIL/uL (ref 3.87–5.11)
RDW: 13.8 % (ref 11.5–15.5)
WBC: 4.8 10*3/uL (ref 4.0–10.5)
nRBC: 0 % (ref 0.0–0.2)

## 2021-05-14 LAB — COMPREHENSIVE METABOLIC PANEL
ALT: 56 U/L — ABNORMAL HIGH (ref 0–44)
AST: 46 U/L — ABNORMAL HIGH (ref 15–41)
Albumin: 4.3 g/dL (ref 3.5–5.0)
Alkaline Phosphatase: 105 U/L (ref 38–126)
Anion gap: 8 (ref 5–15)
BUN: 29 mg/dL — ABNORMAL HIGH (ref 8–23)
CO2: 25 mmol/L (ref 22–32)
Calcium: 8.8 mg/dL — ABNORMAL LOW (ref 8.9–10.3)
Chloride: 102 mmol/L (ref 98–111)
Creatinine, Ser: 1.09 mg/dL — ABNORMAL HIGH (ref 0.44–1.00)
GFR, Estimated: 57 mL/min — ABNORMAL LOW (ref >60)
Glucose, Bld: 164 mg/dL — ABNORMAL HIGH (ref 70–99)
Potassium: 4 mmol/L (ref 3.5–5.1)
Sodium: 135 mmol/L (ref 135–145)
Total Bilirubin: 0.3 mg/dL (ref 0.3–1.2)
Total Protein: 7.7 g/dL (ref 6.5–8.1)

## 2021-05-14 LAB — MAGNESIUM: Magnesium: 1.2 mg/dL — ABNORMAL LOW (ref 1.7–2.4)

## 2021-05-14 MED ORDER — SODIUM CHLORIDE 0.9 % IV SOLN: 6.0000 g | Freq: Once | INTRAVENOUS | Status: DC

## 2021-05-14 MED ORDER — HEPARIN SOD (PORK) LOCK FLUSH 100 UNIT/ML IV SOLN
500.0000 [IU] | Freq: Once | INTRAVENOUS | Status: DC
  Filled 2021-05-14: qty 5

## 2021-05-14 MED ORDER — MAGNESIUM SULFATE 4 GM/100ML IV SOLN
4.0000 g | Freq: Once | INTRAVENOUS | Status: AC
  Administered 2021-05-14: 4 g via INTRAVENOUS
  Filled 2021-05-14: qty 100

## 2021-05-14 MED ORDER — HEPARIN SOD (PORK) LOCK FLUSH 100 UNIT/ML IV SOLN
INTRAVENOUS | Status: AC
  Administered 2021-05-14: 500 [IU]
  Filled 2021-05-14: qty 5

## 2021-05-14 MED ORDER — SODIUM CHLORIDE 0.9 % IV SOLN
25.0000 mg | Freq: Once | INTRAVENOUS | Status: AC
  Administered 2021-05-14: 25 mg via INTRAVENOUS
  Filled 2021-05-14: qty 1

## 2021-05-14 MED ORDER — CYANOCOBALAMIN 1000 MCG/ML IJ SOLN
1000.0000 ug | Freq: Once | INTRAMUSCULAR | Status: AC
  Administered 2021-05-14: 1000 ug via INTRAMUSCULAR
  Filled 2021-05-14: qty 1

## 2021-05-14 MED ORDER — SODIUM CHLORIDE 0.9 % IV SOLN
INTRAVENOUS | Status: DC
  Filled 2021-05-14: qty 250

## 2021-05-14 MED ORDER — BORTEZOMIB CHEMO SQ INJECTION 3.5 MG (2.5MG/ML)
2.0000 mg | Freq: Once | INTRAMUSCULAR | Status: AC
  Administered 2021-05-14: 2 mg via SUBCUTANEOUS
  Filled 2021-05-14: qty 0.8

## 2021-05-14 MED ORDER — MAGNESIUM SULFATE 2 GM/50ML IV SOLN
2.0000 g | Freq: Once | INTRAVENOUS | Status: AC
  Administered 2021-05-14: 2 g via INTRAVENOUS
  Filled 2021-05-14: qty 50

## 2021-05-14 NOTE — Progress Notes (Signed)
DISCONTINUE ON PATHWAY REGIMEN - Multiple Myeloma and Other Plasma Cell Dyscrasias     A cycle is every 2 weeks:     Bortezomib   **Always confirm dose/schedule in your pharmacy ordering system**  REASON: Toxicities / Adverse Event PRIOR TREATMENT: [id number]: Bortezomib 1.3 mg/m2 SUBQ q2 Weeks x 2 Years TREATMENT RESPONSE: Complete Response (CR)  START ON PATHWAY REGIMEN - Multiple Myeloma and Other Plasma Cell Dyscrasias     A cycle is every 28 days:     Lenalidomide      Daratumumab and hyaluronidase-fihj   **Always confirm dose/schedule in your pharmacy ordering system**  Patient Characteristics: Multiple Myeloma, Post-Transplant Maintenance Therapy, High Risk Disease Classification: Multiple Myeloma R-ISS Staging: III Therapeutic Status: Post-transplant Maintenance Therapy Risk Status: High Risk Intent of Therapy: Non-Curative / Palliative Intent, Discussed with Patient

## 2021-05-14 NOTE — Progress Notes (Signed)
Patient here for follow up. Pt reports that her neurologist took her off gabapentin and started her on Lyrica.

## 2021-05-14 NOTE — Progress Notes (Signed)
Hematology/Oncology [facility] note  [phone] [phone]     Clinic Day:  05/14/2021   Referring physician: Glean Hess, MD  Chief Complaint: Anwen Cannedy is a 65 y.o. 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   Initial bone marrow revealed 80% plasma cells.  Lambda free light chains were 1340.  She had nephrotic range proteinuria.  She initially underwent induction with RVD.  Revlimid maintenance was discontinued on 01/21/2017 secondary to intolerance.     07/19/2019 Bone marrow aspirate and biopsy on  revealed a normocellular marrow with but increased lambda-restricted plasma cells (9% aspirate, 40% CD138 immunohistochemistry).  Findings were consistent with recurrent plasma cell myeloma.  Flow cytometry revealed no monoclonal B-cell or phenotypically aberrant T-cell population. Cytogenetics were 36, XX (normal).  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    Lambda light chains have been followed: 22.2 (ratio 0.56) on 07/03/2017, 30.8 (ratio 0.78) on 09/02/2017, 36.9 (ratio 0.40) on 10/21/2017, 37.4 (ratio 0.41) on 12/16/2017, 70.7(ratio 0.31)  on 02/17/2018, 64.2 (ratio 0.27) on 04/07/2018, 78.9 (ratio 0.18) on 05/26/2018, 128.8 (ratio 0.17) on 08/06/2018, 181.5 (ratio 0.13) on 10/08/2018, 130.9 (ratio 0.13) on 10/20/2018, 160.7 (ratio  0.10) on 12/09/2018, 236.6 (ratio 0.07) on 02/01/2019, 363.6 (ratio 0.04) on 03/22/2019, 404.8 (ratio 0.04) on 04/05/2019, 420.7 (ratio 0.03) on 05/24/2019, 573.4 (ratio 0.03) on 06/23/2019, 451.05 (ratio 0.02) on 08/20/2019, 47.2 (ratio 0.13) on 10/25/2019, 22.4 (ratio 0.21) on 11/25/2019, 16.5 (ratio 0.33) on 01/10/2020, 14.6 (ratio 0.34) on 02/07/2020, 13.1 (ratio 0.31) on 03/07/2020, 10.1 (ratio 0.38) on 04/10/2020, and 9.5 (ratio 0.21) on 06/19/2020.   24 hour UPEP on 06/03/2019 revealed kappa free light chains 95.76, lambda free light chains 1,260.71, and ratio 0.08.  24 hour UPEP on 08/23/2019 revealed total protein of 782 mg/24 hrs with lambda free light chains 1,084.16 mg/L and ratio of 0.10 (1.03-31.76).  M spike in urine was 46.1% (361 mg/24 hrs).    Bone survey on 04/08/2016 and 05/28/2017 revealed no definite lytic lesion seen in the visualized skeleton.  Bone survey on 11/19/2018 revealed no suspicious lucent lesions and no acute bony abnormality.  PET scan on 07/12/2019 revealed no focal metabolic activity to suggest active myeloma within the skeleton. There were no lytic lesions identified on the CT portion of the exam or soft tissue plasmacytomas. There was no evidence of multiple myeloma.    Pretreatment RBC phenotype on 09/23/2019 was positive for C, e, DUFFY B, KIDD B, M, S, and s antigen; negative for c, E, KELL, DUFFY A, KIDD A, and N antigen.    09/27/2019 - 10/25/2019; 12/09/2019 - 03/13/2020 6 cycles of daratumumab and hyaluronidase-fihj, Pomalyst, and Decadron (DPd) .  Cycle #1 was complicated by fever and neutropenia requiring admission.  Cycle #2 was complicated by pneumonia requiring admission.  Cycle #6 was complicated with an ER evaluation for an elevated lactic acid.   05/10/2020 second autologous stem cell transplant at [facility]  She underwent conditioning with melphalan 140 mg/m2.    She is s/p week #3 Velcade maintenance (began 08/31/2020 - 09/28/2020).  She has no increase  in neuropathy.  She has severe aortic stenosis.  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 Cardiology is following for a possible TAVR in the future.  Echo on 07/05/2020 revealed moderate to severe aortic stenosis with an EF of 50-55%.  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 Zometa was discontinued in 01/2017.  She has chronic nausea on Phenergan.     B12 deficiency.  B12 was 254 on 04/09/2017, 295 on 08/20/2018, and 391 on 10/08/2018.  She was on oral B12.  She received B12 monthly (last 09/14/2020).  Folate was 12.1 on 01/10/2020.    She has iron deficiency.  Ferritin was 32 on 07/01/2019.  She received Venofer on 07/15/2019 and 07/22/2019.   She has hypogammaglobulinemia.  IgG was 245 on 11/25/2019.  She received monthly IVIG (07/22/021 - 03/22/2020).  She received IVIG 400 mg/kg on 01/20/2020, 200 mg/kg on 02/17/2020, and 300 mg/kg on 03/22/2020.  IVIG on 27/12/8673 was complicated by acute renal failure. IgG trough level was 418 on 02/17/2020.  10/15/2019 - 10/20/2019 She was admitted to [facility] with fever and neutropenia.  Cultures were negative.  CXR was negative. She received broad spectrum antibiotics and daily Granix.  She received IVF for acute renal insufficiency due to diarrhea and dehydration.  Creatine was 1.66 on admission and 1.12 on discharge.    03/01/2020 - 03/05/2020 admitted to [facility] from with fever and neutropenia. CXR revealed no active cardiopulmonary disease. Chest CT with contrast revealed no acute intrathoracic pathology. There were findings which could be suggestive of prior granulomatous disease. She was treated with Cefepime and Vancomycin, then switched to ciprofloxacin on 03/03/2020.   05/08/2020 - 12/05/2021The patient was admitted to [facility] from  for autologous bone marrow transplant.   She received conditioning with melphalan 140 mg/m2.  Bone marrow transplant was on  05/10/2020. The patient developed fevers daily from 05/19/2020 - 05/24/2020. Blood cultures were + for strept sanguis bacteremia.  She was treated cefepime, vancomycin, and solumedrol for possible engraftment syndrome. Cefepime was switched to ceftriaxone; she completed 2 weeks of ceftriaxone on 06/03/2020. She had chemotherapy induced diarrhea.  She experienced urinary retention.  Feb 2022 patient has been on maintenance Velcade 2 mg every 2 weeks.  Chronic diarrhea seen by gastroenterology Dr. Vicente Males and was recommended to avoid artificial sugars in her diet including Diet Coke and coffee mate.  She feels some improvement of her diarrhea frequency since the dietary modification.  GI profile PCR negative, C. difficile toxin A+ B negative, fecal calprotectin 77 12/19/2020 colonoscopy showed 2 subcentimeter polyps in the ascending colon, resected and removed.  Pathology showed tubular adenoma.  No malignancy.  Normal mucosa in the entire examined colon.  Biopsied, pathology showed colonic mucosa with no significant pathological alteration.  Negative for active inflammation and features of chronicity.  Negative for microscopic colitis, dysplasia, and malignanc  Diabetes, patient has discontinued metformin due to diarrhea and started on glipizide 2.5 mg   03/05/2021-03/09/2021 Patient was hospitalized due to fever and chills, nonproductive cough, shortness of breath. X-ray showed right lower lobe atelectasis versus pneumonia.  Blood cultures negative.  Urine culture showed 60,000 colonies of Enterococcus facialis.  Patient received antibiotics. Patient also had abdominal pelvis CT scan for work-up of intermittent lower abdomen pain.No  acute abnormality.   05/14/21 last dose of Velcade maintenance.   INTERVAL HISTORY Anaira Seay is a 65 y.o. female who has above history reviewed by me today presents for follow up visit for management of multiple myeloma.  During the interval, she was seen by [facility]. Recommendation was to continue Velcade if neuropathy is worse, switch to Daratumumab.  Chronic neuropathy is getting worse, she drops items.  Patient establish care with neurology Dr. Gurney Maxin and her neuropathy regimen has been switched from gabapentin to Lyrica. Diarrhea symptoms are controlled.  She takes Imodium as needed.  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   Due to Zometa   Cardiomyopathy, idiopathic ([place]) 08/21/2017   Overview:  Septal and apical hypokinesis with ef 40%   CHF (congestive heart failure) (HCC)    CKD (chronic kidney disease) stage 3, GFR 30-59 ml/min (HCC)    Depression    Diabetes mellitus (HCC)    Dizziness    Fatty liver    Frequent falls    GERD (gastroesophageal reflux disease)    Gout    Heart murmur    History of blood transfusion    History of bone marrow transplant ([place])    History of uterine fibroid    Hx of cardiac catheterization 06/05/2016   Overview:  Normal coronaries 2017   Hypertension    Hypomagnesemia    IDA (iron deficiency anemia)    Multiple myeloma ([place])    Personal history of chemotherapy    Renal cyst    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[place]) 06/05/2016   Overview:  Sp ablation 1999    Past Surgical History:  Procedure Laterality Date   ABDOMINAL HYSTERECTOMY     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   CARDIAC ELECTROPHYSIOLOGY MAPPING AND ABLATION     CARPAL TUNNEL RELEASE Bilateral    CHOLECYSTECTOMY  2008   COLONOSCOPY WITH PROPOFOL N/A 05/07/2017   Procedure: COLONOSCOPY WITH PROPOFOL;  Surgeon: [name], MD;  Location: [facility] ENDOSCOPY;  Service: Gastroenterology;  Laterality: N/A;   COLONOSCOPY WITH PROPOFOL N/A 12/19/2020   Procedure: COLONOSCOPY WITH PROPOFOL;  Surgeon: [name], MD;   Location: [facility] ENDOSCOPY;  Service: Gastroenterology;  Laterality: N/A;   ESOPHAGOGASTRODUODENOSCOPY (EGD) WITH PROPOFOL N/A 05/07/2017   Procedure: ESOPHAGOGASTRODUODENOSCOPY (EGD) WITH PROPOFOL;  Surgeon: [name], MD;  Location: [facility] ENDOSCOPY;  Service: Gastroenterology;  Laterality: N/A;   FOOT SURGERY Bilateral    INCONTINENCE SURGERY  2009   INTERSTIM IMPLANT PLACEMENT     other     over active bladder   OTHER SURGICAL HISTORY     bladder stimulator    PARTIAL HYSTERECTOMY  03/1996   fibroids   PORTA CATH INSERTION N/A 03/10/2019   Procedure: PORTA CATH INSERTION;  Surgeon: Algernon Huxley, MD;  Location: [facility];  Service: Cardiovascular;  Laterality: N/A;   TONSILLECTOMY  2007    Family History  Problem Relation Age of Onset   Colon cancer Father    Renal Disease Father    Diabetes Mellitus II Father    Melanoma Paternal Grandmother    Breast cancer Maternal Aunt 2   Anemia Mother    Heart disease Mother    Heart failure Mother    Renal  Disease Mother    Congestive Heart Failure Mother    Heart disease Maternal Uncle    Throat cancer Maternal Uncle    Lung cancer Maternal Uncle    Liver disease Maternal Uncle    Heart failure Maternal Uncle    Hearing loss Son 24       Suicide     Social History:  reports that she quit smoking about 29 years ago. Her smoking use included cigarettes. She has a 20.00 pack-year smoking history. She has never used smokeless tobacco. She reports current alcohol use. She reports that she does not use drugs.  She is on disability. She notes exposure to perchloroethylene [facility]).  She lives much of her adult life in [place].   Her husband passed away.    Allergies:  Allergies  Allergen Reactions   Oxycodone-Acetaminophen Anaphylaxis    Swelling and rash   Celebrex [Celecoxib] Diarrhea   Codeine    Plerixafor     In 2016 during ASCT collection patient developed fever to 103.22F and required hospitalization    Benadryl [Diphenhydramine] Palpitations   Morphine Itching and Rash   Ondansetron Diarrhea   Tylenol [Acetaminophen] Itching and Rash    Current Medications: Current Outpatient Medications  Medication Sig Dispense Refill   acetaminophen (TYLENOL) 500 MG tablet Take 500 mg by mouth every 6 (six) hours as needed.     acyclovir (ZOVIRAX) 400 MG tablet Take 1 tablet (400 mg total) by mouth 2 (two) times daily. 180 tablet 1   albuterol (VENTOLIN HFA) 108 (90 Base) MCG/ACT inhaler Inhale 2 puffs into the lungs every 6 (six) hours as needed for wheezing or shortness of breath. 8 g 0   bisoprolol (ZEBETA) 5 MG tablet Take 5 mg by mouth daily.     FLUoxetine (PROZAC) 40 MG capsule TAKE 1 CAPSULE EVERY DAY 90 capsule 0   glucose blood test strip      Multiple Vitamin (MULTIVITAMIN) tablet Take 1 tablet by mouth daily.     Multiple Vitamins-Minerals (HAIR/SKIN/NAILS) TABS Take 1 tablet by mouth daily.     omeprazole (PRILOSEC) 40 MG capsule Take 1 capsule (40 mg total) by mouth in the morning and at bedtime. 180 capsule 1   pregabalin (LYRICA) 25 MG capsule Take 25 mg by mouth in the morning and at bedtime.     sucralfate (CARAFATE) 1 g tablet Take 1 tablet (1 g total) by mouth 4 (four) times daily -  with meals and at bedtime. 120 tablet 2   tiZANidine (ZANAFLEX) 2 MG tablet TAKE 1 TABLET BY MOUTH EVERY 6 HOURS AS NEEDED FOR MUSCLE SPASMS 30 tablet 1   traZODone (DESYREL) 100 MG tablet TAKE 1 TABLET AT BEDTIME 90 tablet 0   allopurinol (ZYLOPRIM) 100 MG tablet TAKE 1 TABLET(100 MG) BY MOUTH DAILY as needed (Patient not taking: Reported on 05/14/2021) 90 tablet 1   diclofenac sodium (VOLTAREN) 1 % GEL Apply 2 g topically 4 (four) times daily. (Patient not taking: No sig reported) 100 g 1   diphenhydrAMINE (BENADRYL) 50 MG capsule Take 50 mg by mouth as needed. (Patient not taking: No sig reported)     diphenoxylate-atropine (LOMOTIL) 2.5-0.025 MG tablet Take 2 tablets by mouth 4 (four) times daily as  needed for diarrhea or loose stools. (Patient not taking: No sig reported) 64 tablet 3   fluticasone (FLONASE) 50 MCG/ACT nasal spray Place 2 sprays into both nostrils daily. (Patient not taking: No sig reported) 16 g 2   promethazine (  PHENERGAN) 25 MG tablet Take 1 tablet (25 mg total) by mouth every 6 (six) hours as needed for nausea or vomiting. (Patient not taking: No sig reported) 90 tablet 1   promethazine-dextromethorphan (PROMETHAZINE-DM) 6.25-15 MG/5ML syrup Take 5 mLs by mouth 4 (four) times daily as needed for cough. (Patient not taking: No sig reported) 180 mL 0   No current facility-administered medications for this visit.   Facility-Administered Medications Ordered in Other Visits  Medication Dose Route Frequency Provider Last Rate Last Admin   0.9 %  sodium chloride infusion   Intravenous Once Earlie Server, MD       sodium chloride flush (NS) 0.9 % injection 10 mL  10 mL Intravenous PRN Earlie Server, MD   10 mL at 04/02/21 7782    Review of Systems  Constitutional:  Negative for chills, fever, malaise/fatigue and weight loss.  HENT:  Negative for sore throat.   Eyes:  Negative for redness.  Respiratory:  Negative for cough, shortness of breath and wheezing.   Cardiovascular:  Negative for chest pain, palpitations and leg swelling.  Gastrointestinal:  Positive for diarrhea and nausea. Negative for abdominal pain, blood in stool and vomiting.  Genitourinary:  Negative for dysuria.  Musculoskeletal:  Positive for back pain and joint pain. Negative for myalgias.  Skin:  Negative for rash.  Neurological:  Positive for tingling and sensory change. Negative for dizziness and tremors.  Endo/Heme/Allergies:  Does not bruise/bleed easily.  Psychiatric/Behavioral:  Negative for hallucinations.    Performance status (ECOG): 1  Vitals Blood pressure 136/82, pulse 72, temperature 98.2 F (36.8 C), resp. rate 18, weight 163 lb 3.2 oz (74 kg).  Physical Exam Vitals and nursing note reviewed.   Constitutional:      General: She is not in acute distress.    Appearance: She is well-developed. She is not ill-appearing, toxic-appearing or diaphoretic.     Interventions: Face mask in place.  HENT:     Head: Normocephalic and atraumatic.     Mouth/Throat:     Mouth: Mucous membranes are moist.     Pharynx: Oropharynx is clear.  Eyes:     General: No scleral icterus.    Pupils: Pupils are equal, round, and reactive to light.  Cardiovascular:     Rate and Rhythm: Normal rate and regular rhythm.     Heart sounds: Normal heart sounds. No murmur heard. Pulmonary:     Effort: Pulmonary effort is normal. No respiratory distress.     Breath sounds: Normal breath sounds. No wheezing or rales.  Chest:     Chest wall: No tenderness.  Abdominal:     General: Bowel sounds are normal. There is no distension.     Palpations: Abdomen is soft. There is no hepatomegaly, splenomegaly or mass.     Tenderness: There is no abdominal tenderness. There is no guarding or rebound.  Musculoskeletal:        General: No tenderness. Normal range of motion.     Cervical back: Normal range of motion and neck supple.     Right lower leg: No edema.     Left lower leg: No edema.  Lymphadenopathy:     Head:     Right side of head: No preauricular, posterior auricular or occipital adenopathy.     Left side of head: No preauricular, posterior auricular or occipital adenopathy.     Cervical: No cervical adenopathy.     Upper Body:     Right upper body: No supraclavicular adenopathy.  Left upper body: No supraclavicular adenopathy.  Skin:    General: Skin is warm and dry.  Neurological:     Mental Status: She is alert and oriented to person, place, and time. Mental status is at baseline.  Psychiatric:        Mood and Affect: Mood normal.   Laboratory data Infusion on 05/14/2021  Component Date Value Ref Range Status   Magnesium 05/14/2021 1.2 (A)  1.7 - 2.4 mg/dL Final   Performed at [facility], [address] [place], [place] [address]   Sodium 05/14/2021 135  135 - 145 mmol/L Final   Potassium 05/14/2021 4.0  3.5 - 5.1 mmol/L Final   Chloride 05/14/2021 102  98 - 111 mmol/L Final   CO2 05/14/2021 25  22 - 32 mmol/L Final   Glucose, Bld 05/14/2021 164 (A)  70 - 99 mg/dL Final   Glucose reference range applies only to samples taken after fasting for at least 8 hours.   BUN 05/14/2021 29 (A)  8 - 23 mg/dL Final   Creatinine, Ser 05/14/2021 1.09 (A)  0.44 - 1.00 mg/dL Final   Calcium 05/14/2021 8.8 (A)  8.9 - 10.3 mg/dL Final   Total Protein 05/14/2021 7.7  6.5 - 8.1 g/dL Final   Albumin 05/14/2021 4.3  3.5 - 5.0 g/dL Final   AST 05/14/2021 46 (A)  15 - 41 U/L Final   ALT 05/14/2021 56 (A)  0 - 44 U/L Final   Alkaline Phosphatase 05/14/2021 105  38 - 126 U/L Final   Total Bilirubin 05/14/2021 0.3  0.3 - 1.2 mg/dL Final   GFR, Estimated 05/14/2021 57 (A)  >60 mL/min Final   Comment: (NOTE) Calculated using the CKD-EPI Creatinine Equation (2021)    Anion gap 05/14/2021 8  5 - 15 Final   Performed at [facility], [address] [place], [place] [address]   WBC 05/14/2021 4.8  4.0 - 10.5 K/uL Final   RBC 05/14/2021 4.26  3.87 - 5.11 MIL/uL Final   Hemoglobin 05/14/2021 11.7 (A)  12.0 - 15.0 g/dL Final   HCT 05/14/2021 35.6 (A)  36.0 - 46.0 % Final   MCV 05/14/2021 83.6  80.0 - 100.0 fL Final   MCH 05/14/2021 27.5  26.0 - 34.0 pg Final   MCHC 05/14/2021 32.9  30.0 - 36.0 g/dL Final   RDW 05/14/2021 13.8  11.5 - 15.5 % Final   Platelets 05/14/2021 118 (A)  150 - 400 K/uL Final   nRBC 05/14/2021 0.0  0.0 - 0.2 % Final   Neutrophils Relative % 05/14/2021 66  % Final   Neutro Abs 05/14/2021 3.2  1.7 - 7.7 K/uL Final   Lymphocytes Relative 05/14/2021 21  % Final   Lymphs Abs 05/14/2021 1.0  0.7 - 4.0 K/uL Final   Monocytes Relative 05/14/2021 9  % Final   Monocytes Absolute 05/14/2021 0.4  0.1 - 1.0 K/uL Final   Eosinophils Relative 05/14/2021 2  % Final    Eosinophils Absolute 05/14/2021 0.1  0.0 - 0.5 K/uL Final   Basophils Relative 05/14/2021 1  % Final   Basophils Absolute 05/14/2021 0.0  0.0 - 0.1 K/uL Final   Immature Granulocytes 05/14/2021 1  % Final   Abs Immature Granulocytes 05/14/2021 0.03  0.00 - 0.07 K/uL Final   Performed at [facility], [address], [place], [place] [address]    Assessment/Plan 1. Encounter for antineoplastic chemotherapy   2. Hypomagnesemia   3. Normocytic anemia  4. Chemotherapy-induced neuropathy ([place])   5. Multiple myeloma in remission ([place])   6. Type II diabetes mellitus with complication (HCC)     # Recurrent lambda light chain multiple myeloma s/p second autologous bone marrow transplant [05/10/2020] Labs reviewed and discussed with patient. Multiple myeloma showed negative M protein.  Light chain ratio is normal. 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  Attention on follow-up. Labs are reviewed and discussed with patient. We discussed about option of switching to daratumumab injections.  She agrees with the plan. We discussed about holding today's treatment and switch to daratumumab.  Patient prefers to continue Velcade today and bridging to daratumumab in 2 weeks.  Continue prophylaxis with Valtrex up to 1 year after transplant-November 2022 Vaccination at [place] transplant RN coordinator [phone]).  #Hyperglobulinemia post stem cell transplantation.  May consider  IVIG replacement therapy if she has frequent infections. .-Her IgG levels are normal.  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   Follows up with neurology.  Switched to Lyrica.  # iron deficiency anemia, Hemoglobin 11.7  #chronic diarrhea, Gastroenterology recommendation was reviewed.  Avoid artificial sugar.  Colonoscopy showed no microscopic colitis. Her symptom seems to  have improved slightly after dietary modification. Velcade side effects may also contribute to diarrhea  #Chronic hypomagnesemia Magnesium is 1.2 today, proceed with  IV magnesium today. 6 g Continue weekly magnesium +/- IV magnesium.  Patient reports that she has been taking Slow-Mag recently.  #Dysphagia, chronic nausea, patient request to have IV Phenergan in the clinic with treatment.    # B12 deficiency, patient has been on vitamin B12 monthly. Last dose 11/09/2020. Vitamin B12 is 404 continue B12 injections monthly.   RTC: 1 week, lab magnesium/IV magnesium  RTC in 2 weeks for labs (CBC with diff, CMP, Mg ), md  for Daratumumab and IV Mg   I discussed the assessment and treatment plan with the patient.  The patient was provided an opportunity to ask questions and all were answered.  The patient agreed with the plan and demonstrated an understanding of the instructions.  The patient was advised to call back if the symptoms worsen or if the condition fails to improve as anticipated.   Earlie Server, MD, PhD 05/14/2021

## 2021-05-14 NOTE — Patient Instructions (Signed)
[facility]  Discharge Instructions: Thank you for choosing [facility] to provide your oncology and hematology care.  If you have a lab appointment with the [facility], please go directly to the [facility] and check in at the registration area.  Wear comfortable clothing and clothing appropriate for easy access to any Portacath or PICC line.   We strive to give you quality time with your provider. You may need to reschedule your appointment if you arrive late (15 or more minutes).  Arriving late affects you and other patients whose appointments are after yours.  Also, if you miss three or more appointments without notifying the office, you may be dismissed from the clinic at the provider's discretion.      For prescription refill requests, have your pharmacy contact our office and allow 72 hours for refills to be completed.    Today you received the following chemotherapy and/or immunotherapy agents: Velcade      To help prevent nausea and vomiting after your treatment, we encourage you to take your nausea medication as directed.  BELOW ARE SYMPTOMS THAT SHOULD BE REPORTED IMMEDIATELY: *FEVER GREATER THAN 100.4 F (38 C) OR HIGHER *CHILLS OR SWEATING *NAUSEA AND VOMITING THAT IS NOT CONTROLLED WITH YOUR NAUSEA MEDICATION *UNUSUAL SHORTNESS OF BREATH *UNUSUAL BRUISING OR BLEEDING *URINARY PROBLEMS (pain or burning when urinating, or frequent urination) *BOWEL PROBLEMS (unusual diarrhea, constipation, pain near the anus) TENDERNESS IN MOUTH AND THROAT WITH OR WITHOUT PRESENCE OF ULCERS (sore throat, sores in mouth, or a toothache) UNUSUAL RASH, SWELLING OR PAIN  UNUSUAL VAGINAL DISCHARGE OR ITCHING   Items with * indicate a potential emergency and should be followed up as soon as possible or go to the Emergency Department if any problems should occur.  Please show the CHEMOTHERAPY ALERT CARD or IMMUNOTHERAPY ALERT CARD at check-in to  the Emergency Department and triage nurse.  Should you have questions after your visit or need to cancel or reschedule your appointment, please contact [facility]  [phone] and follow the prompts.  Office hours are 8:00 a.m. to 4:30 p.m. Monday - Friday. Please note that voicemails left after 4:00 p.m. may not be returned until the following business day.  We are closed weekends and major holidays. You have access to a nurse at all times for urgent questions. Please call the main number to the clinic [phone] and follow the prompts.  For any non-urgent questions, you may also contact your provider using MyChart. We now offer e-Visits for anyone 64 and older to request care online for non-urgent symptoms. For details visit [email or web address].   Also download the MyChart app! Go to the app store, search "MyChart", open the app, select [facility], and log in with your MyChart username and password.  Due to Covid, a mask is required upon entering the hospital/clinic. If you do not have a mask, one will be given to you upon arrival. For doctor visits, patients may have 1 support person aged 64 or older with them. For treatment visits, patients cannot have anyone with them due to current Covid guidelines and our immunocompromised population. Magnesium Sulfate Injection What is this medication? MAGNESIUM SULFATE (mag NEE zee um SUL fate) prevents and treats low levels of magnesium in your body. It may also be used to prevent and treat seizures during pregnancy in people with high blood pressure disorders, such as preeclampsia or eclampsia. Magnesium plays an important role in maintaining the  health of your muscles and nervous system. This medicine may be used for other purposes; ask your health care provider or pharmacist if you have questions. What should I tell my care team before I take this medication? They need to know if you have any of these  conditions: Heart disease History of irregular heart beat Kidney disease An unusual or allergic reaction to magnesium sulfate, medications, foods, dyes, or preservatives Pregnant or trying to get pregnant Breast-feeding How should I use this medication? This medication is for infusion into a vein. It is given in a hospital or clinic setting. Talk to your care team about the use of this medication in children. While this medication may be prescribed for selected conditions, precautions do apply. Overdosage: If you think you have taken too much of this medicine contact a poison control center or emergency room at once. NOTE: This medicine is only for you. Do not share this medicine with others. What if I miss a dose? This does not apply. What may interact with this medication? Certain medications for anxiety or sleep Certain medications for seizures like phenobarbital Digoxin Medications that relax muscles for surgery Narcotic medications for pain This list may not describe all possible interactions. Give your health care provider a list of all the medicines, herbs, non-prescription drugs, or dietary supplements you use. Also tell them if you smoke, drink alcohol, or use illegal drugs. Some items may interact with your medicine. What should I watch for while using this medication? Your condition will be monitored carefully while you are receiving this medication. You may need blood work done while you are receiving this medication. What side effects may I notice from receiving this medication? Side effects that you should report to your care team as soon as possible: Allergic reactions--skin rash, itching, hives, swelling of the face, lips, tongue, or throat High magnesium level--confusion, drowsiness, facial flushing, redness, sweating, muscle weakness, fast or irregular heartbeat, trouble breathing Low blood pressure--dizziness, feeling faint or lightheaded, blurry vision Side effects  that usually do not require medical attention (report to your care team if they continue or are bothersome): Headache Nausea This list may not describe all possible side effects. Call your doctor for medical advice about side effects. You may report side effects to FDA at 1-800-FDA-1088. Where should I keep my medication? This medication is given in a hospital or clinic and will not be stored at home. NOTE: This sheet is a summary. It may not cover all possible information. If you have questions about this medicine, talk to your doctor, pharmacist, or health care provider.  2022 Elsevier/Gold Standard (2020-08-31 00:00:00) Vitamin B12 Injection What is this medication? Vitamin B12 (VAHY tuh min B12) prevents and treats low vitamin B12 levels in your body. It is used in people who do not get enough vitamin B12 from their diet or when their digestive tract does not absorb enough. Vitamin B12 plays an important role in maintaining the health of your nervous system and red blood cells. This medicine may be used for other purposes; ask your health care provider or pharmacist if you have questions. COMMON BRAND NAME(S): B-12 Compliance Kit, B-12 Injection Kit, Cyomin, Dodex, LA-12, Nutri-Twelve, Physicians EZ Use B-12, Primabalt What should I tell my care team before I take this medication? They need to know if you have any of these conditions: Kidney disease Leber's disease Megaloblastic anemia An unusual or allergic reaction to cyanocobalamin, cobalt, other medications, foods, dyes, or preservatives Pregnant or trying to get  pregnant Breast-feeding How should I use this medication? This medication is injected into a muscle or deeply under the skin. It is usually given in a clinic or care team's office. However, your care team may teach you how to inject yourself. Follow all instructions. Talk to your care team about the use of this medication in children. Special care may be needed. Overdosage:  If you think you have taken too much of this medicine contact a poison control center or emergency room at once. NOTE: This medicine is only for you. Do not share this medicine with others. What if I miss a dose? If you are given your dose at a clinic or care team's office, call to reschedule your appointment. If you give your own injections, and you miss a dose, take it as soon as you can. If it is almost time for your next dose, take only that dose. Do not take double or extra doses. What may interact with this medication? Colchicine Heavy alcohol intake This list may not describe all possible interactions. Give your health care provider a list of all the medicines, herbs, non-prescription drugs, or dietary supplements you use. Also tell them if you smoke, drink alcohol, or use illegal drugs. Some items may interact with your medicine. What should I watch for while using this medication? Visit your care team regularly. You may need blood work done while you are taking this medication. You may need to follow a special diet. Talk to your care team. Limit your alcohol intake and avoid smoking to get the best benefit. What side effects may I notice from receiving this medication? Side effects that you should report to your care team as soon as possible: Allergic reactions--skin rash, itching, hives, swelling of the face, lips, tongue, or throat Swelling of the ankles, hands, or feet Trouble breathing Side effects that usually do not require medical attention (report to your care team if they continue or are bothersome): Diarrhea This list may not describe all possible side effects. Call your doctor for medical advice about side effects. You may report side effects to FDA at 1-800-FDA-1088. Where should I keep my medication? Keep out of the reach of children. Store at room temperature between 15 and 30 degrees C (59 and 85 degrees F). Protect from light. Throw away any unused medication after the  expiration date. NOTE: This sheet is a summary. It may not cover all possible information. If you have questions about this medicine, talk to your doctor, pharmacist, or health care provider.  2022 Elsevier/Gold Standard (2020-08-30 00:00:00) Bortezomib injection What is this medication? BORTEZOMIB (bor TEZ oh mib) targets proteins in cancer cells and stops the cancer cells from growing. It treats multiple myeloma and mantle cell lymphoma. This medicine may be used for other purposes; ask your health care provider or pharmacist if you have questions. COMMON BRAND NAME(S): Velcade What should I tell my care team before I take this medication? They need to know if you have any of these conditions: dehydration diabetes (high blood sugar) heart disease liver disease tingling of the fingers or toes or other nerve disorder an unusual or allergic reaction to bortezomib, mannitol, boron, other medicines, foods, dyes, or preservatives pregnant or trying to get pregnant breast-feeding How should I use this medication? This medicine is injected into a vein or under the skin. It is given by a health care provider in a hospital or clinic setting. Talk to your health care provider about the use of this medicine  in children. Special care may be needed. Overdosage: If you think you have taken too much of this medicine contact a poison control center or emergency room at once. NOTE: This medicine is only for you. Do not share this medicine with others. What if I miss a dose? Keep appointments for follow-up doses. It is important not to miss your dose. Call your health care provider if you are unable to keep an appointment. What may interact with this medication? This medicine may interact with the following medications: ketoconazole rifampin This list may not describe all possible interactions. Give your health care provider a list of all the medicines, herbs, non-prescription drugs, or dietary  supplements you use. Also tell them if you smoke, drink alcohol, or use illegal drugs. Some items may interact with your medicine. What should I watch for while using this medication? Your condition will be monitored carefully while you are receiving this medicine. You may need blood work done while you are taking this medicine. You may get drowsy or dizzy. Do not drive, use machinery, or do anything that needs mental alertness until you know how this medicine affects you. Do not stand up or sit up quickly, especially if you are an older patient. This reduces the risk of dizzy or fainting spells This medicine may increase your risk of getting an infection. Call your health care provider for advice if you get a fever, chills, sore throat, or other symptoms of a cold or flu. Do not treat yourself. Try to avoid being around people who are sick. Check with your health care provider if you have severe diarrhea, nausea, and vomiting, or if you sweat a lot. The loss of too much body fluid may make it dangerous for you to take this medicine. Do not become pregnant while taking this medicine or for 7 months after stopping it. Women should inform their health care provider if they wish to become pregnant or think they might be pregnant. Men should not father a child while taking this medicine and for 4 months after stopping it. There is a potential for serious harm to an unborn child. Talk to your health care provider for more information. Do not breast-feed an infant while taking this medicine or for 2 months after stopping it. This medicine may make it more difficult to get pregnant or father a child. Talk to your health care provider if you are concerned about your fertility. What side effects may I notice from receiving this medication? Side effects that you should report to your doctor or health care professional as soon as possible: allergic reactions (skin rash; itching or hives; swelling of the face, lips,  or tongue) bleeding (bloody or black, tarry stools; red or dark brown urine; spitting up blood or brown material that looks like coffee grounds; red spots on the skin; unusual bruising or bleeding from the eye, gums, or nose) blurred vision or changes in vision confusion constipation headache heart failure (trouble breathing; fast, irregular heartbeat; sudden weight gain; swelling of the ankles, feet, hands) infection (fever, chills, cough, sore throat, pain or trouble passing urine) lack or loss of appetite liver injury (dark yellow or brown urine; general ill feeling or flu-like symptoms; loss of appetite, right upper belly pain; yellowing of the eyes or skin) low blood pressure (dizziness; feeling faint or lightheaded, falls; unusually weak or tired) muscle cramps pain, redness, or irritation at site where injected pain, tingling, numbness in the hands or feet seizures trouble breathing unusual bruising  or bleeding Side effects that usually do not require medical attention (report to your doctor or health care professional if they continue or are bothersome): diarrhea nausea stomach pain trouble sleeping vomiting This list may not describe all possible side effects. Call your doctor for medical advice about side effects. You may report side effects to FDA at 1-800-FDA-1088. Where should I keep my medication? This medicine is given in a hospital or clinic. It will not be stored at home. NOTE: This sheet is a summary. It may not cover all possible information. If you have questions about this medicine, talk to your doctor, pharmacist, or health care provider.  2022 Elsevier/Gold Standard (2020-06-08 00:00:00)

## 2021-05-19 DIAGNOSIS — R69 Illness, unspecified: Secondary | ICD-10-CM | POA: Diagnosis not present

## 2021-05-21 ENCOUNTER — Inpatient Hospital Stay: Payer: Medicare HMO

## 2021-05-21 ENCOUNTER — Other Ambulatory Visit: Payer: Self-pay

## 2021-05-21 DIAGNOSIS — E1122 Type 2 diabetes mellitus with diabetic chronic kidney disease: Secondary | ICD-10-CM | POA: Diagnosis not present

## 2021-05-21 DIAGNOSIS — N183 Chronic kidney disease, stage 3 unspecified: Secondary | ICD-10-CM | POA: Diagnosis not present

## 2021-05-21 DIAGNOSIS — Z5112 Encounter for antineoplastic immunotherapy: Secondary | ICD-10-CM | POA: Diagnosis not present

## 2021-05-21 DIAGNOSIS — D509 Iron deficiency anemia, unspecified: Secondary | ICD-10-CM | POA: Diagnosis not present

## 2021-05-21 DIAGNOSIS — C9000 Multiple myeloma not having achieved remission: Secondary | ICD-10-CM

## 2021-05-21 DIAGNOSIS — C9001 Multiple myeloma in remission: Secondary | ICD-10-CM | POA: Diagnosis not present

## 2021-05-21 DIAGNOSIS — I129 Hypertensive chronic kidney disease with stage 1 through stage 4 chronic kidney disease, or unspecified chronic kidney disease: Secondary | ICD-10-CM | POA: Diagnosis not present

## 2021-05-21 DIAGNOSIS — F32A Depression, unspecified: Secondary | ICD-10-CM | POA: Diagnosis not present

## 2021-05-21 DIAGNOSIS — E538 Deficiency of other specified B group vitamins: Secondary | ICD-10-CM | POA: Diagnosis not present

## 2021-05-21 LAB — MAGNESIUM: Magnesium: 1.2 mg/dL — ABNORMAL LOW (ref 1.7–2.4)

## 2021-05-21 MED ORDER — SODIUM CHLORIDE 0.9% FLUSH
10.0000 mL | Freq: Once | INTRAVENOUS | Status: AC
  Administered 2021-05-21: 10 mL via INTRAVENOUS
  Filled 2021-05-21: qty 10

## 2021-05-21 MED ORDER — MAGNESIUM SULFATE 4 GM/100ML IV SOLN
4.0000 g | Freq: Once | INTRAVENOUS | Status: AC
  Administered 2021-05-21: 4 g via INTRAVENOUS
  Filled 2021-05-21: qty 100

## 2021-05-21 MED ORDER — MAGNESIUM SULFATE 2 GM/50ML IV SOLN
2.0000 g | Freq: Once | INTRAVENOUS | Status: AC
  Administered 2021-05-21: 2 g via INTRAVENOUS
  Filled 2021-05-21: qty 50

## 2021-05-21 MED ORDER — SODIUM CHLORIDE 0.9 % IV SOLN
25.0000 mg | Freq: Once | INTRAVENOUS | Status: AC
  Administered 2021-05-21: 25 mg via INTRAVENOUS
  Filled 2021-05-21: qty 1

## 2021-05-21 MED ORDER — HEPARIN SOD (PORK) LOCK FLUSH 100 UNIT/ML IV SOLN
500.0000 [IU] | Freq: Once | INTRAVENOUS | Status: AC
  Administered 2021-05-21: 500 [IU] via INTRAVENOUS
  Filled 2021-05-21: qty 5

## 2021-05-21 MED ORDER — SODIUM CHLORIDE 0.9 % IV SOLN
Freq: Once | INTRAVENOUS | Status: AC
  Filled 2021-05-21: qty 250

## 2021-05-21 MED ORDER — SODIUM CHLORIDE 0.9 % IV SOLN: 6.0000 g | Freq: Once | INTRAVENOUS | Status: DC

## 2021-05-21 NOTE — Patient Instructions (Signed)
Magnesium Sulfate Injection What is this medication? MAGNESIUM SULFATE (mag NEE zee um SUL fate) prevents and treats low levels of magnesium in your body. It may also be used to prevent and treat seizures during pregnancy in people with high blood pressure disorders, such as preeclampsia or eclampsia. Magnesium plays an important role in maintaining the health of your muscles and nervous system. This medicine may be used for other purposes; ask your health care provider or pharmacist if you have questions. What should I tell my care team before I take this medication? They need to know if you have any of these conditions: Heart disease History of irregular heart beat Kidney disease An unusual or allergic reaction to magnesium sulfate, medications, foods, dyes, or preservatives Pregnant or trying to get pregnant Breast-feeding How should I use this medication? This medication is for infusion into a vein. It is given in a hospital or clinic setting. Talk to your care team about the use of this medication in children. While this medication may be prescribed for selected conditions, precautions do apply. Overdosage: If you think you have taken too much of this medicine contact a poison control center or emergency room at once. NOTE: This medicine is only for you. Do not share this medicine with others. What if I miss a dose? This does not apply. What may interact with this medication? Certain medications for anxiety or sleep Certain medications for seizures like phenobarbital Digoxin Medications that relax muscles for surgery Narcotic medications for pain This list may not describe all possible interactions. Give your health care provider a list of all the medicines, herbs, non-prescription drugs, or dietary supplements you use. Also tell them if you smoke, drink alcohol, or use illegal drugs. Some items may interact with your medicine. What should I watch for while using this medication? Your  condition will be monitored carefully while you are receiving this medication. You may need blood work done while you are receiving this medication. What side effects may I notice from receiving this medication? Side effects that you should report to your care team as soon as possible: Allergic reactions--skin rash, itching, hives, swelling of the face, lips, tongue, or throat High magnesium level--confusion, drowsiness, facial flushing, redness, sweating, muscle weakness, fast or irregular heartbeat, trouble breathing Low blood pressure--dizziness, feeling faint or lightheaded, blurry vision Side effects that usually do not require medical attention (report to your care team if they continue or are bothersome): Headache Nausea This list may not describe all possible side effects. Call your doctor for medical advice about side effects. You may report side effects to FDA at 1-800-FDA-1088. Where should I keep my medication? This medication is given in a hospital or clinic and will not be stored at home. NOTE: This sheet is a summary. It may not cover all possible information. If you have questions about this medicine, talk to your doctor, pharmacist, or health care provider.  2022 Elsevier/Gold Standard (2020-08-31 00:00:00)  

## 2021-05-21 NOTE — Progress Notes (Signed)
Magnesium 1.2. Received 6 gm of magnesium over 3 hours. VSS. Discharged to home.

## 2021-05-22 ENCOUNTER — Ambulatory Visit
Admission: RE | Admit: 2021-05-22 | Discharge: 2021-05-22 | Disposition: A | Discharge status: Home / Self Care | Payer: Medicare HMO | Source: Ambulatory Visit | Attending: Neurology | Admitting: Neurology

## 2021-05-22 DIAGNOSIS — R200 Anesthesia of skin: Secondary | ICD-10-CM

## 2021-05-22 DIAGNOSIS — R202 Paresthesia of skin: Secondary | ICD-10-CM

## 2021-05-23 ENCOUNTER — Other Ambulatory Visit: Payer: Self-pay

## 2021-05-23 ENCOUNTER — Ambulatory Visit
Admission: RE | Admit: 2021-05-23 | Discharge: 2021-05-23 | Disposition: A | Discharge status: Home / Self Care | Payer: Medicare HMO | Source: Ambulatory Visit | Attending: Neurology | Admitting: Neurology

## 2021-05-23 ENCOUNTER — Other Ambulatory Visit: Payer: Self-pay | Admitting: Internal Medicine

## 2021-05-23 DIAGNOSIS — R202 Paresthesia of skin: Secondary | ICD-10-CM | POA: Insufficient documentation

## 2021-05-23 DIAGNOSIS — R200 Anesthesia of skin: Secondary | ICD-10-CM | POA: Insufficient documentation

## 2021-05-23 DIAGNOSIS — M545 Low back pain, unspecified: Secondary | ICD-10-CM | POA: Diagnosis not present

## 2021-05-23 DIAGNOSIS — M25551 Pain in right hip: Secondary | ICD-10-CM

## 2021-05-23 NOTE — Telephone Encounter (Signed)
Requested medication (s) are due for refill today  Last ordered 05/04/21  #30 with one refill  Requested medication (s) are on the active medication list  yes  Future visit scheduled yes for 12/07/21  Note to clinic-Non delegated medication routing to physician for review.   Requested Prescriptions  Pending Prescriptions Disp Refills   tiZANidine (ZANAFLEX) 2 MG tablet [Pharmacy Med Name: TIZANIDINE 2MG  TABLETS] 30 tablet 1    Sig: TAKE 1 TABLET BY MOUTH EVERY 6 HOURS AS NEEDED FOR MUSCLE SPASMS     Not Delegated - Cardiovascular:  Alpha-2 Agonists - tizanidine Failed - 05/23/2021 10:45 AM      Failed - This refill cannot be delegated      Passed - Valid encounter within last 6 months    Recent Outpatient Visits           2 months ago Pneumonia of right lower lobe due to infectious organism   [facility] Glean Hess, MD   5 months ago Annual physical exam   [facility] Glean Hess, MD   1 year ago Benign essential HTN   [facility] Glean Hess, MD   1 year ago Annual physical exam   [facility] Glean Hess, MD   1 year ago Benign essential HTN   [facility] Glean Hess, MD       Future Appointments             In 6 months Army Melia Jesse Sans, MD [facility], [facility]

## 2021-05-23 NOTE — Progress Notes (Signed)
Pharmacist Chemotherapy Monitoring - Initial Assessment    Anticipated start date: 05/30/21   The following has been reviewed per standard work regarding the patient's treatment regimen: The patient's diagnosis, treatment plan and drug doses, and organ/hematologic function Lab orders and baseline tests specific to treatment regimen  The treatment plan start date, drug sequencing, and pre-medications Prior authorization status  Patient's documented medication list, including drug-drug interaction screen and prescriptions for anti-emetics and supportive care specific to the treatment regimen The drug concentrations, fluid compatibility, administration routes, and timing of the medications to be used The patient's access for treatment and lifetime cumulative dose history, if applicable  The patient's medication allergies and previous infusion related reactions, if applicable   Changes made to treatment plan:  treatment plan date  Follow up needed:  signing treatment plan   Sarah [name], [place], 05/23/2021  10:37 AM

## 2021-05-26 ENCOUNTER — Other Ambulatory Visit: Payer: Self-pay | Admitting: Internal Medicine

## 2021-05-26 DIAGNOSIS — K219 Gastro-esophageal reflux disease without esophagitis: Secondary | ICD-10-CM

## 2021-05-27 NOTE — Telephone Encounter (Signed)
Requested Prescriptions  Pending Prescriptions Disp Refills  . omeprazole (PRILOSEC) 40 MG capsule [Pharmacy Med Name: OMEPRAZOLE 40 MG Capsule Delayed Release] 180 capsule 1    Sig: TAKE 1 CAPSULE (40 MG TOTAL) BY MOUTH IN THE MORNING AND AT BEDTIME.     Gastroenterology: Proton Pump Inhibitors Passed - 05/26/2021  4:00 AM      Passed - Valid encounter within last 12 months    Recent Outpatient Visits          2 months ago Pneumonia of right lower lobe due to infectious organism   [facility] Glean Hess, MD   5 months ago Annual physical exam   [facility] Glean Hess, MD   1 year ago Benign essential HTN   [facility] Glean Hess, MD   1 year ago Annual physical exam   [facility] Glean Hess, MD   1 year ago Benign essential HTN   [facility] Glean Hess, MD      Future Appointments            In 6 months Army Melia Jesse Sans, MD [facility], [facility]

## 2021-05-28 ENCOUNTER — Ambulatory Visit (INDEPENDENT_AMBULATORY_CARE_PROVIDER_SITE_OTHER): Payer: Medicare HMO | Admitting: Internal Medicine

## 2021-05-28 ENCOUNTER — Encounter: Payer: Self-pay | Admitting: Internal Medicine

## 2021-05-28 ENCOUNTER — Other Ambulatory Visit: Payer: Self-pay

## 2021-05-28 VITALS — BP 132/78 | HR 47 | Temp 97.3°F | Ht 68.0 in | Wt 162.2 lb

## 2021-05-28 DIAGNOSIS — G8929 Other chronic pain: Secondary | ICD-10-CM

## 2021-05-28 DIAGNOSIS — Z20828 Contact with and (suspected) exposure to other viral communicable diseases: Secondary | ICD-10-CM | POA: Diagnosis not present

## 2021-05-28 DIAGNOSIS — R059 Cough, unspecified: Secondary | ICD-10-CM

## 2021-05-28 DIAGNOSIS — J0100 Acute maxillary sinusitis, unspecified: Secondary | ICD-10-CM

## 2021-05-28 DIAGNOSIS — M25551 Pain in right hip: Secondary | ICD-10-CM | POA: Diagnosis not present

## 2021-05-28 DIAGNOSIS — E118 Type 2 diabetes mellitus with unspecified complications: Secondary | ICD-10-CM

## 2021-05-28 LAB — POCT INFLUENZA A/B
Influenza A, POC: NEGATIVE
Influenza B, POC: NEGATIVE

## 2021-05-28 MED ORDER — AZITHROMYCIN 250 MG PO TABS: ORAL_TABLET | ORAL | 0 refills | Status: AC

## 2021-05-28 MED ORDER — TIZANIDINE HCL 2 MG PO TABS: 2.0000 mg | ORAL_TABLET | Freq: Four times a day (QID) | ORAL | 1 refills | Status: DC | PRN

## 2021-05-28 MED ORDER — FLUCONAZOLE 100 MG PO TABS: 100.0000 mg | ORAL_TABLET | Freq: Every day | ORAL | 0 refills | Status: AC

## 2021-05-28 MED ORDER — PROMETHAZINE-DM 6.25-15 MG/5ML PO SYRP: 5.0000 mL | ORAL_SOLUTION | Freq: Four times a day (QID) | ORAL | 0 refills | Status: DC | PRN

## 2021-05-28 MED ORDER — OSELTAMIVIR PHOSPHATE 75 MG PO CAPS: 75.0000 mg | ORAL_CAPSULE | Freq: Every day | ORAL | 0 refills | Status: DC

## 2021-05-28 NOTE — Progress Notes (Signed)
Date:  05/28/2021   Name:  Sarah [name]   DOB:  01/14/1956   MRN:  [id number]   Chief Complaint: Influenza  Influenza This is a new problem. The current episode started yesterday. The problem has been gradually worsening. Associated symptoms include chest pain, chills, congestion, coughing, fatigue, a fever (only one day), headaches, myalgias, nausea, a sore throat and vomiting.  Diabetes She presents for her follow-up diabetic visit. She has type 2 diabetes mellitus. Her disease course has been stable. Hypoglycemia symptoms include headaches. Associated symptoms include chest pain and fatigue. Current diabetic treatment includes diet. An ACE inhibitor/angiotensin II receptor blocker is not being taken. Eye exam is current.   Lab Results  Component Value Date   NA 135 05/14/2021   K 4.0 05/14/2021   CO2 25 05/14/2021   GLUCOSE 164 (H) 05/14/2021   BUN 29 (H) 05/14/2021   CREATININE 1.09 (H) 05/14/2021   CALCIUM 8.8 (L) 05/14/2021   GFRNONAA 57 (L) 05/14/2021   Lab Results  Component Value Date   CHOL 177 12/05/2020   HDL 59 12/05/2020   LDLCALC 94 12/05/2020   TRIG 140 12/05/2020   CHOLHDL 3.0 12/05/2020   Lab Results  Component Value Date   TSH 2.105 08/20/2018   Lab Results  Component Value Date   HGBA1C 6.6 (H) 11/09/2020   Lab Results  Component Value Date   WBC 4.8 05/14/2021   HGB 11.7 (L) 05/14/2021   HCT 35.6 (L) 05/14/2021   MCV 83.6 05/14/2021   PLT 118 (L) 05/14/2021   Lab Results  Component Value Date   ALT 56 (H) 05/14/2021   AST 46 (H) 05/14/2021   ALKPHOS 105 05/14/2021   BILITOT 0.3 05/14/2021   [name] components found for: VITD  Review of Systems  Constitutional:  Positive for chills, fatigue and fever (only one day).  HENT:  Positive for congestion, postnasal drip, sinus pressure and sore throat.   Eyes:  Negative for visual disturbance.  Respiratory:  Positive for cough. Negative for chest tightness, shortness of breath (unless she  exerts herself) and wheezing.   Cardiovascular:  Positive for chest pain.  Gastrointestinal:  Positive for nausea and vomiting.  Musculoskeletal:  Positive for myalgias.  Neurological:  Positive for headaches.   Patient Active Problem List   Diagnosis Date Noted   Multiple myeloma in remission ([place]) 05/14/2021   Leg swelling 04/02/2021   Neuropathy 04/02/2021   Fever in adult 03/05/2021   PVC (premature ventricular contraction) 12/04/2020   Diarrhea 10/25/2020   Encounter for antineoplastic chemotherapy 08/31/2020   Coronary artery disease involving native coronary artery of native heart 04/26/2020   Yeast vaginitis 03/18/2020   Hypokalemia 03/11/2020   Dyspnea 03/02/2020   Physical deconditioning 02/07/2020   Impaired nutrition 02/07/2020   Right lower lobe pneumonia 11/13/2019   Encounter for antineoplastic immunotherapy 10/03/2019   Hypocalcemia 08/28/2019   Stage 3 chronic kidney disease ([place]) 05/12/2019   Chronic nausea 12/17/2018   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   Hyperlipidemia associated with type 2 diabetes mellitus ([place]) 11/25/2018   Anemia 10/21/2018   Hypomagnesemia 08/20/2018   Goals of care, counseling/discussion 08/20/2018   B12 deficiency 08/20/2018   Thrombocytopenia ([place]) 02/09/2018   Benign essential HTN 08/21/2017   Carotid artery stenosis, asymptomatic, bilateral 08/06/2017   Thyroid nodule 08/06/2017   Iron deficiency anemia due to chronic blood loss 05/13/2017   Spinal stenosis of lumbar region with neurogenic claudication 04/09/2017   Long term prescription opiate use 09/07/2016  Chronic pain syndrome 07/08/2016   Pain medication agreement signed 07/08/2016   Spondylosis of lumbar region without myelopathy or radiculopathy 07/08/2016   Severe aortic stenosis 06/05/2016   Environmental and seasonal allergies 04/19/2016   Insomnia 04/19/2016   Asthma 04/19/2016   Aortic regurgitation 04/19/2016   Type II diabetes mellitus with  complication ([place]) 79/15/0569   Depression, major, single episode, in partial remission ([place]) 04/12/2016   Irritable bowel syndrome (IBS) 04/12/2016   Hepatic steatosis 04/12/2016   Multiple myeloma not having achieved remission ([place]) 04/02/2016   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[place]) 07/06/2015   Stem cell transplant candidate 05/16/2015    Allergies  Allergen Reactions   Oxycodone-Acetaminophen Anaphylaxis    Swelling and rash   Celebrex [Celecoxib] Diarrhea   Codeine    Plerixafor     In 2016 during ASCT collection patient developed fever to 103.52F and required hospitalization   Benadryl [Diphenhydramine] Palpitations   Morphine Itching and Rash   Ondansetron Diarrhea   Tylenol [Acetaminophen] Itching and Rash    Past Surgical History:  Procedure Laterality Date   ABDOMINAL HYSTERECTOMY     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   CARDIAC ELECTROPHYSIOLOGY MAPPING AND ABLATION     CARPAL TUNNEL RELEASE Bilateral    CHOLECYSTECTOMY  2008   COLONOSCOPY WITH PROPOFOL N/A 05/07/2017   Procedure: COLONOSCOPY WITH PROPOFOL;  Surgeon: [name], MD;  [facility];  Service: Gastroenterology;  Laterality: N/A;   COLONOSCOPY WITH PROPOFOL N/A 12/19/2020   Procedure: COLONOSCOPY WITH PROPOFOL;  Surgeon: [name], MD;  Location: [facility] ENDOSCOPY;  Service: Gastroenterology;  Laterality: N/A;   ESOPHAGOGASTRODUODENOSCOPY (EGD) WITH PROPOFOL N/A 05/07/2017   Procedure: ESOPHAGOGASTRODUODENOSCOPY (EGD) WITH PROPOFOL;  Surgeon: [name], MD;  Location: [facility] ENDOSCOPY;  Service: Gastroenterology;  Laterality: N/A;   FOOT SURGERY Bilateral    INCONTINENCE SURGERY  2009   INTERSTIM IMPLANT PLACEMENT     other     over active bladder   OTHER SURGICAL HISTORY     bladder stimulator    PARTIAL HYSTERECTOMY  03/1996   fibroids   PORTA CATH INSERTION N/A 03/10/2019   Procedure: PORTA CATH INSERTION;  Surgeon: Algernon Huxley, MD;  Location: [facility];  Service:  Cardiovascular;  Laterality: N/A;   TONSILLECTOMY  2007    Social History   Tobacco Use   Smoking status: Former    Packs/day: 1.00    Years: 20.00    Pack years: 20.00    Types: Cigarettes    Quit date: 07/02/1991    Years since quitting: 29.9   Smokeless tobacco: Never  Vaping Use   Vaping Use: Never used  Substance Use Topics   Alcohol use: Yes    Comment: occasionally   Drug use: [name]     Medication list has been reviewed and updated.  Current Meds  Medication Sig   acetaminophen (TYLENOL) 500 MG tablet Take 500 mg by mouth every 6 (six) hours as needed.   acyclovir (ZOVIRAX) 400 MG tablet Take 1 tablet (400 mg total) by mouth 2 (two) times daily.   albuterol (VENTOLIN HFA) 108 (90 Base) MCG/ACT inhaler Inhale 2 puffs into the lungs every 6 (six) hours as needed for wheezing or shortness of breath.   allopurinol (ZYLOPRIM) 100 MG tablet TAKE 1 TABLET(100 MG) BY MOUTH DAILY as needed   bisoprolol (ZEBETA) 5 MG tablet Take 5 mg by mouth daily.   diclofenac sodium (VOLTAREN) 1 % GEL Apply 2 g topically 4 (  four) times daily.   FLUoxetine (PROZAC) 40 MG capsule TAKE 1 CAPSULE EVERY DAY   fluticasone (FLONASE) 50 MCG/ACT nasal spray Place 2 sprays into both nostrils daily.   glucose blood test strip    Multiple Vitamin (MULTIVITAMIN) tablet Take 1 tablet by mouth daily.   Multiple Vitamins-Minerals (HAIR/SKIN/NAILS) TABS Take 1 tablet by mouth daily.   omeprazole (PRILOSEC) 40 MG capsule TAKE 1 CAPSULE (40 MG TOTAL) BY MOUTH IN THE MORNING AND AT BEDTIME.   pregabalin (LYRICA) 25 MG capsule Take 25 mg by mouth in the morning and at bedtime.   promethazine (PHENERGAN) 25 MG tablet Take 1 tablet (25 mg total) by mouth every 6 (six) hours as needed for nausea or vomiting.   sucralfate (CARAFATE) 1 g tablet Take 1 tablet (1 g total) by mouth 4 (four) times daily -  with meals and at bedtime.   traZODone (DESYREL) 100 MG tablet TAKE 1 TABLET AT BEDTIME   [DISCONTINUED] tiZANidine  (ZANAFLEX) 2 MG tablet TAKE 1 TABLET BY MOUTH EVERY 6 HOURS AS NEEDED FOR MUSCLE SPASMS    PHQ 2/9 Scores 05/28/2021 03/13/2021 12/05/2020 04/03/2020  PHQ - 2 Score 2 2 1 2   PHQ- 9 Score 12 11 2 2     GAD 7 : Generalized Anxiety Score 05/28/2021 03/13/2021 12/05/2020 04/03/2020  Nervous, Anxious, on Edge 1 2 1 3   Control/stop worrying 0 1 1 3   Worry too much - different things 1 1 1 3   Trouble relaxing 1 2 1  0  Restless 1 2 1  0  Easily annoyed or irritable 1 0 1 0  Afraid - awful might happen 0 0 1 0  Total GAD 7 Score 5 8 7 9   Anxiety Difficulty Not difficult at all - Not difficult at all Not difficult at all    BP Readings from Last 3 Encounters:  05/28/21 132/78  05/21/21 (!) 156/70  05/14/21 136/82    Physical Exam Constitutional:      Appearance: Normal appearance.  HENT:     Right Ear: Tympanic membrane and ear canal normal.     Left Ear: Tympanic membrane and ear canal normal.     Nose:     Right Sinus: Maxillary sinus tenderness and frontal sinus tenderness present.     Left Sinus: Maxillary sinus tenderness and frontal sinus tenderness present.     Mouth/Throat:     Pharynx: Posterior oropharyngeal erythema present.     Comments: Mucoid drainage posterior pharynx Neck:     Vascular: [name] carotid bruit.  Cardiovascular:     Rate and Rhythm: Normal rate and regular rhythm.     Pulses: Normal pulses.  Pulmonary:     Effort: Pulmonary effort is normal.     Breath sounds: [name] wheezing or rhonchi.  Musculoskeletal:     Cervical back: Normal range of motion.  Lymphadenopathy:     Cervical: [name] cervical adenopathy.  Neurological:     Mental Status: She is alert.    Wt Readings from Last 3 Encounters:  05/28/21 162 lb 3.2 oz (73.6 kg)  05/14/21 163 lb 3.2 oz (74 kg)  04/30/21 160 lb 11.2 oz (72.9 kg)    BP 132/78   Pulse (!) 47   Temp (!) 97.3 F (36.3 C) (Oral)   Ht 5' 8"  (1.727 m)   Wt 162 lb 3.2 oz (73.6 kg)   SpO2 96%   BMI 24.66 kg/m   Assessment and  Plan: 1. Exposure to influenza Testing negative currently but spend  the weekend with her sister who now being admitted to [facility] Will give preventative dosing. - POCT Influenza A/B - oseltamivir (TAMIFLU) 75 MG capsule; Take 1 capsule (75 mg total) by mouth daily.  Dispense: 10 capsule; Refill: 0  2. Hip pain, chronic, right - tiZANidine (ZANAFLEX) 2 MG tablet; Take 1 tablet (2 mg total) by mouth every 6 (six) hours as needed for muscle spasms.  Dispense: 30 tablet; Refill: 1  3. Cough in adult - promethazine-dextromethorphan (PROMETHAZINE-DM) 6.25-15 MG/5ML syrup; Take 5 mLs by mouth 4 (four) times daily as needed for cough.  Dispense: 180 mL; Refill: 0  4. Acute non-recurrent maxillary sinusitis Likely the cause of her congestion and cough. Recommend rest, fluids and follow up if worsening - azithromycin (ZITHROMAX Z-PAK) 250 MG tablet; UAD  Dispense: 6 each; Refill: 0  5. Type II diabetes mellitus with complication (HCC) Last W1T 6.6. Managed with diet alone. Due for labs - she will ask A1C to be done with next oncology labs.   Partially dictated using Editor, commissioning. Any errors are unintentional.  Halina Maidens, MD [facility]  05/28/2021

## 2021-05-29 ENCOUNTER — Other Ambulatory Visit: Payer: Medicare HMO

## 2021-05-29 ENCOUNTER — Encounter: Payer: Self-pay | Admitting: Oncology

## 2021-05-29 ENCOUNTER — Ambulatory Visit: Payer: Medicare HMO | Admitting: Oncology

## 2021-05-29 ENCOUNTER — Ambulatory Visit: Payer: Medicare HMO

## 2021-05-29 NOTE — Telephone Encounter (Signed)
Please r/s appts for 1 week

## 2021-05-29 NOTE — Telephone Encounter (Signed)
Pt is got flu she doesn't have an appt for Monday not sure where she got that info. When do we need to try and get her back in.

## 2021-05-30 ENCOUNTER — Ambulatory Visit: Payer: Medicare HMO | Admitting: Internal Medicine

## 2021-05-30 ENCOUNTER — Inpatient Hospital Stay: Payer: Medicare HMO | Admitting: Oncology

## 2021-05-30 ENCOUNTER — Inpatient Hospital Stay: Payer: Medicare HMO

## 2021-06-04 ENCOUNTER — Other Ambulatory Visit: Payer: Self-pay | Admitting: Oncology

## 2021-06-04 DIAGNOSIS — R110 Nausea: Secondary | ICD-10-CM

## 2021-06-05 ENCOUNTER — Encounter: Payer: Self-pay | Admitting: Internal Medicine

## 2021-06-05 ENCOUNTER — Encounter: Payer: Self-pay | Admitting: Oncology

## 2021-06-06 ENCOUNTER — Encounter: Payer: Self-pay | Admitting: Oncology

## 2021-06-06 ENCOUNTER — Inpatient Hospital Stay (HOSPITAL_BASED_OUTPATIENT_CLINIC_OR_DEPARTMENT_OTHER): Payer: Medicare HMO | Admitting: Oncology

## 2021-06-06 ENCOUNTER — Inpatient Hospital Stay: Payer: Medicare HMO | Attending: Oncology

## 2021-06-06 ENCOUNTER — Other Ambulatory Visit: Payer: Self-pay

## 2021-06-06 ENCOUNTER — Inpatient Hospital Stay: Payer: Medicare HMO

## 2021-06-06 VITALS — BP 159/83 | HR 85 | Temp 96.1°F | Resp 18 | Ht 62.0 in | Wt 159.8 lb

## 2021-06-06 DIAGNOSIS — C9001 Multiple myeloma in remission: Secondary | ICD-10-CM | POA: Diagnosis not present

## 2021-06-06 DIAGNOSIS — E119 Type 2 diabetes mellitus without complications: Secondary | ICD-10-CM | POA: Diagnosis not present

## 2021-06-06 DIAGNOSIS — Z841 Family history of disorders of kidney and ureter: Secondary | ICD-10-CM | POA: Insufficient documentation

## 2021-06-06 DIAGNOSIS — Z801 Family history of malignant neoplasm of trachea, bronchus and lung: Secondary | ICD-10-CM | POA: Diagnosis not present

## 2021-06-06 DIAGNOSIS — G620 Drug-induced polyneuropathy: Secondary | ICD-10-CM | POA: Diagnosis not present

## 2021-06-06 DIAGNOSIS — Z5111 Encounter for antineoplastic chemotherapy: Secondary | ICD-10-CM

## 2021-06-06 DIAGNOSIS — Z833 Family history of diabetes mellitus: Secondary | ICD-10-CM | POA: Insufficient documentation

## 2021-06-06 DIAGNOSIS — Z87891 Personal history of nicotine dependence: Secondary | ICD-10-CM | POA: Diagnosis not present

## 2021-06-06 DIAGNOSIS — Z8249 Family history of ischemic heart disease and other diseases of the circulatory system: Secondary | ICD-10-CM | POA: Diagnosis not present

## 2021-06-06 DIAGNOSIS — C9000 Multiple myeloma not having achieved remission: Secondary | ICD-10-CM | POA: Insufficient documentation

## 2021-06-06 DIAGNOSIS — I10 Essential (primary) hypertension: Secondary | ICD-10-CM | POA: Insufficient documentation

## 2021-06-06 DIAGNOSIS — E538 Deficiency of other specified B group vitamins: Secondary | ICD-10-CM | POA: Insufficient documentation

## 2021-06-06 DIAGNOSIS — Z5112 Encounter for antineoplastic immunotherapy: Secondary | ICD-10-CM | POA: Diagnosis not present

## 2021-06-06 DIAGNOSIS — Z79899 Other long term (current) drug therapy: Secondary | ICD-10-CM | POA: Insufficient documentation

## 2021-06-06 DIAGNOSIS — Z808 Family history of malignant neoplasm of other organs or systems: Secondary | ICD-10-CM | POA: Insufficient documentation

## 2021-06-06 DIAGNOSIS — Z800 Family history of malignant neoplasm of digestive organs: Secondary | ICD-10-CM | POA: Insufficient documentation

## 2021-06-06 DIAGNOSIS — T451X5A Adverse effect of antineoplastic and immunosuppressive drugs, initial encounter: Secondary | ICD-10-CM

## 2021-06-06 DIAGNOSIS — R1310 Dysphagia, unspecified: Secondary | ICD-10-CM | POA: Insufficient documentation

## 2021-06-06 DIAGNOSIS — G629 Polyneuropathy, unspecified: Secondary | ICD-10-CM | POA: Diagnosis not present

## 2021-06-06 DIAGNOSIS — Z803 Family history of malignant neoplasm of breast: Secondary | ICD-10-CM | POA: Diagnosis not present

## 2021-06-06 DIAGNOSIS — R808 Other proteinuria: Secondary | ICD-10-CM | POA: Insufficient documentation

## 2021-06-06 DIAGNOSIS — Z8379 Family history of other diseases of the digestive system: Secondary | ICD-10-CM | POA: Diagnosis not present

## 2021-06-06 DIAGNOSIS — D801 Nonfamilial hypogammaglobulinemia: Secondary | ICD-10-CM | POA: Insufficient documentation

## 2021-06-06 DIAGNOSIS — E118 Type 2 diabetes mellitus with unspecified complications: Secondary | ICD-10-CM

## 2021-06-06 DIAGNOSIS — Z832 Family history of diseases of the blood and blood-forming organs and certain disorders involving the immune mechanism: Secondary | ICD-10-CM | POA: Diagnosis not present

## 2021-06-06 LAB — TYPE AND SCREEN
ABO/RH(D): O POS
Antibody Screen: NEGATIVE

## 2021-06-06 LAB — CBC WITH DIFFERENTIAL/PLATELET
Abs Immature Granulocytes: 0.04 10*3/uL (ref 0.00–0.07)
Basophils Absolute: 0 10*3/uL (ref 0.0–0.1)
Basophils Relative: 0 %
Eosinophils Absolute: 0.1 10*3/uL (ref 0.0–0.5)
Eosinophils Relative: 2 %
HCT: 37.4 % (ref 36.0–46.0)
Hemoglobin: 12.4 g/dL (ref 12.0–15.0)
Immature Granulocytes: 1 %
Lymphocytes Relative: 16 %
Lymphs Abs: 1.1 10*3/uL (ref 0.7–4.0)
MCH: 27.1 pg (ref 26.0–34.0)
MCHC: 33.2 g/dL (ref 30.0–36.0)
MCV: 81.8 fL (ref 80.0–100.0)
Monocytes Absolute: 0.4 10*3/uL (ref 0.1–1.0)
Monocytes Relative: 6 %
Neutro Abs: 5 10*3/uL (ref 1.7–7.7)
Neutrophils Relative %: 75 %
Platelets: 134 10*3/uL — ABNORMAL LOW (ref 150–400)
RBC: 4.57 MIL/uL (ref 3.87–5.11)
RDW: 13.2 % (ref 11.5–15.5)
WBC: 6.7 10*3/uL (ref 4.0–10.5)
nRBC: 0 % (ref 0.0–0.2)

## 2021-06-06 LAB — COMPREHENSIVE METABOLIC PANEL
ALT: 52 U/L — ABNORMAL HIGH (ref 0–44)
AST: 46 U/L — ABNORMAL HIGH (ref 15–41)
Albumin: 4.4 g/dL (ref 3.5–5.0)
Alkaline Phosphatase: 116 U/L (ref 38–126)
Anion gap: 12 (ref 5–15)
BUN: 22 mg/dL (ref 8–23)
CO2: 23 mmol/L (ref 22–32)
Calcium: 8.9 mg/dL (ref 8.9–10.3)
Chloride: 102 mmol/L (ref 98–111)
Creatinine, Ser: 1.07 mg/dL — ABNORMAL HIGH (ref 0.44–1.00)
GFR, Estimated: 58 mL/min — ABNORMAL LOW (ref >60)
Glucose, Bld: 163 mg/dL — ABNORMAL HIGH (ref 70–99)
Potassium: 3.7 mmol/L (ref 3.5–5.1)
Sodium: 137 mmol/L (ref 135–145)
Total Bilirubin: 0.4 mg/dL (ref 0.3–1.2)
Total Protein: 7.5 g/dL (ref 6.5–8.1)

## 2021-06-06 LAB — VITAMIN B12: Vitamin B-12: 838 pg/mL (ref 180–914)

## 2021-06-06 LAB — MAGNESIUM: Magnesium: 1 mg/dL — ABNORMAL LOW (ref 1.7–2.4)

## 2021-06-06 MED ORDER — SODIUM CHLORIDE 0.9% FLUSH
10.0000 mL | Freq: Once | INTRAVENOUS | Status: AC
  Administered 2021-06-06: 10 mL via INTRAVENOUS
  Filled 2021-06-06: qty 10

## 2021-06-06 MED ORDER — SODIUM CHLORIDE 0.9 % IV SOLN
Freq: Once | INTRAVENOUS | Status: DC
  Filled 2021-06-06: qty 250

## 2021-06-06 MED ORDER — MONTELUKAST SODIUM 10 MG PO TABS: 10.0000 mg | ORAL_TABLET | ORAL | 1 refills | Status: DC

## 2021-06-06 MED ORDER — SODIUM CHLORIDE 0.9 % IV SOLN
INTRAVENOUS | Status: DC
  Filled 2021-06-06: qty 250

## 2021-06-06 MED ORDER — DARATUMUMAB-HYALURONIDASE-FIHJ 1800-30000 MG-UT/15ML ~~LOC~~ SOLN
1800.0000 mg | Freq: Once | SUBCUTANEOUS | Status: AC
  Administered 2021-06-06: 1800 mg via SUBCUTANEOUS
  Filled 2021-06-06: qty 15

## 2021-06-06 MED ORDER — MONTELUKAST SODIUM 10 MG PO TABS
10.0000 mg | ORAL_TABLET | Freq: Once | ORAL | Status: AC
  Administered 2021-06-06: 10 mg via ORAL
  Filled 2021-06-06: qty 1

## 2021-06-06 MED ORDER — SODIUM CHLORIDE 0.9 % IV SOLN
6.0000 g | Freq: Once | INTRAVENOUS | Status: AC
  Administered 2021-06-06: 6 g via INTRAVENOUS
  Filled 2021-06-06: qty 10

## 2021-06-06 MED ORDER — HEPARIN SOD (PORK) LOCK FLUSH 100 UNIT/ML IV SOLN
INTRAVENOUS | Status: AC
  Administered 2021-06-06: 500 [IU] via INTRAVENOUS
  Filled 2021-06-06: qty 5

## 2021-06-06 MED ORDER — HEPARIN SOD (PORK) LOCK FLUSH 100 UNIT/ML IV SOLN
500.0000 [IU] | Freq: Once | INTRAVENOUS | Status: AC
  Filled 2021-06-06: qty 5

## 2021-06-06 MED ORDER — DEXAMETHASONE 4 MG PO TABS
20.0000 mg | ORAL_TABLET | Freq: Once | ORAL | Status: AC
  Administered 2021-06-06: 20 mg via ORAL
  Filled 2021-06-06: qty 5

## 2021-06-06 MED ORDER — DIPHENHYDRAMINE HCL 25 MG PO CAPS
25.0000 mg | ORAL_CAPSULE | Freq: Once | ORAL | Status: AC
  Administered 2021-06-06: 25 mg via ORAL
  Filled 2021-06-06: qty 1

## 2021-06-06 MED ORDER — SODIUM CHLORIDE 0.9 % IV SOLN
25.0000 mg | Freq: Once | INTRAVENOUS | Status: AC
  Administered 2021-06-06: 25 mg via INTRAVENOUS
  Filled 2021-06-06: qty 1

## 2021-06-06 MED ORDER — HEPARIN SOD (PORK) LOCK FLUSH 100 UNIT/ML IV SOLN
500.0000 [IU] | Freq: Once | INTRAVENOUS | Status: DC | PRN
  Filled 2021-06-06: qty 5

## 2021-06-06 NOTE — Patient Instructions (Signed)
[facility] AT [place]  Discharge Instructions: Thank you for choosing [facility] to provide your oncology and hematology care.  If you have a lab appointment with the [facility], please go directly to the [facility] and check in at the registration area.  Wear comfortable clothing and clothing appropriate for easy access to any Portacath or PICC line.   We strive to give you quality time with your provider. You may need to reschedule your appointment if you arrive late (15 or more minutes).  Arriving late affects you and other patients whose appointments are after yours.  Also, if you miss three or more appointments without notifying the office, you may be dismissed from the clinic at the provider's discretion.      For prescription refill requests, have your pharmacy contact our office and allow 72 hours for refills to be completed.    Today you received the following chemotherapy and/or immunotherapy agents DARZALEX and magnesium      To help prevent nausea and vomiting after your treatment, we encourage you to take your nausea medication as directed.  BELOW ARE SYMPTOMS THAT SHOULD BE REPORTED IMMEDIATELY: *FEVER GREATER THAN 100.4 F (38 C) OR HIGHER *CHILLS OR SWEATING *NAUSEA AND VOMITING THAT IS NOT CONTROLLED WITH YOUR NAUSEA MEDICATION *UNUSUAL SHORTNESS OF BREATH *UNUSUAL BRUISING OR BLEEDING *URINARY PROBLEMS (pain or burning when urinating, or frequent urination) *BOWEL PROBLEMS (unusual diarrhea, constipation, pain near the anus) TENDERNESS IN MOUTH AND THROAT WITH OR WITHOUT PRESENCE OF ULCERS (sore throat, sores in mouth, or a toothache) UNUSUAL RASH, SWELLING OR PAIN  UNUSUAL VAGINAL DISCHARGE OR ITCHING   Items with * indicate a potential emergency and should be followed up as soon as possible or go to the Emergency Department if any problems should occur.  Please show the CHEMOTHERAPY ALERT CARD or IMMUNOTHERAPY ALERT CARD at  check-in to the Emergency Department and triage nurse.  Should you have questions after your visit or need to cancel or reschedule your appointment, please contact [facility] [place] AT [place]  [phone] and follow the prompts.  Office hours are 8:00 a.m. to 4:30 p.m. Monday - Friday. Please note that voicemails left after 4:00 p.m. may not be returned until the following business day.  We are closed weekends and major holidays. You have access to a nurse at all times for urgent questions. Please call the main number to the clinic [phone] and follow the prompts.  For any non-urgent questions, you may also contact your provider using MyChart. We now offer e-Visits for anyone 2 and older to request care online for non-urgent symptoms. For details visit [email or web address].   Also download the MyChart app! Go to the app store, search "MyChart", open the app, select [facility], and log in with your MyChart username and password.  Due to Covid, a mask is required upon entering the hospital/clinic. If you do not have a mask, one will be given to you upon arrival. For doctor visits, patients may have 1 support person aged 54 or older with them. For treatment visits, patients cannot have anyone with them due to current Covid guidelines and our immunocompromised population.   Daratumumab injection What is this medication? DARATUMUMAB (dar a toom ue mab) is a monoclonal antibody. It is used to treat multiple myeloma. This medicine may be used for other purposes; ask your health care provider or pharmacist if you have questions. COMMON BRAND NAME(S): DARZALEX What should I tell my care team  before I take this medication? They need to know if you have any of these conditions: hereditary fructose intolerance infection (especially a virus infection such as chickenpox, herpes, or hepatitis B virus) lung or breathing disease (asthma, COPD) an unusual or allergic reaction to  daratumumab, sorbitol, other medicines, foods, dyes, or preservatives pregnant or trying to get pregnant breast-feeding How should I use this medication? This medicine is for infusion into a vein. It is given by a health care professional in a hospital or clinic setting. Talk to your pediatrician regarding the use of this medicine in children. Special care may be needed. Overdosage: If you think you have taken too much of this medicine contact a poison control center or emergency room at once. NOTE: This medicine is only for you. Do not share this medicine with others. What if I miss a dose? Keep appointments for follow-up doses as directed. It is important not to miss your dose. Call your doctor or health care professional if you are unable to keep an appointment. What may interact with this medication? Interactions have not been studied. This list may not describe all possible interactions. Give your health care provider a list of all the medicines, herbs, non-prescription drugs, or dietary supplements you use. Also tell them if you smoke, drink alcohol, or use illegal drugs. Some items may interact with your medicine. What should I watch for while using this medication? Your condition will be monitored carefully while you are receiving this medicine. This medicine can cause serious allergic reactions. To reduce your risk, your health care provider may give you other medicine to take before receiving this one. Be sure to follow the directions from your health care provider. This medicine can affect the results of blood tests to match your blood type. These changes can last for up to 6 months after the final dose. Your healthcare provider will do blood tests to match your blood type before you start treatment. Tell all of your healthcare providers that you are being treated with this medicine before receiving a blood transfusion. This medicine can affect the results of some tests used to determine  treatment response; extra tests may be needed to evaluate response. Do not become pregnant while taking this medicine or for 3 months after stopping it. Women should inform their health care provider if they wish to become pregnant or think they might be pregnant. There is a potential for serious side effects to an unborn child. Talk to your health care provider for more information. Do not breast-feed an infant while taking this medicine. What side effects may I notice from receiving this medication? Side effects that you should report to your care team as soon as possible: Allergic reactions--skin rash, itching or hives, swelling of the face, lips, or tongue Blurred vision Infection--fever, chills, cough, sore throat, pain or trouble passing urine Infusion reactions--dizziness, fast heartbeat, feeling faint or lightheaded, falls, headache, increase in blood pressure, nausea, vomiting, or wheezing or trouble breathing with loud or whistling sounds Unusual bleeding or bruising Side effects that usually do not require medical attention (report to your care team if they continue or are bothersome): Constipation Diarrhea Pain, tingling, numbness in the hands or feet Swelling of the ankles, feet, hands Tiredness This list may not describe all possible side effects. Call your doctor for medical advice about side effects. You may report side effects to FDA at 1-800-FDA-1088. Where should I keep my medication? This drug is given in a hospital or clinic and  will not be stored at home. NOTE: This sheet is a summary. It may not cover all possible information. If you have questions about this medicine, talk to your doctor, pharmacist, or health care provider.  2022 Elsevier/Gold Standard (2020-11-23 00:00:00)   Magnesium Sulfate Injection What is this medication? MAGNESIUM SULFATE (mag NEE zee um SUL fate) prevents and treats low levels of magnesium in your body. It may also be used to prevent and  treat seizures during pregnancy in people with high blood pressure disorders, such as preeclampsia or eclampsia. Magnesium plays an important role in maintaining the health of your muscles and nervous system. This medicine may be used for other purposes; ask your health care provider or pharmacist if you have questions. What should I tell my care team before I take this medication? They need to know if you have any of these conditions: Heart disease History of irregular heart beat Kidney disease An unusual or allergic reaction to magnesium sulfate, medications, foods, dyes, or preservatives Pregnant or trying to get pregnant Breast-feeding How should I use this medication? This medication is for infusion into a vein. It is given in a hospital or clinic setting. Talk to your care team about the use of this medication in children. While this medication may be prescribed for selected conditions, precautions do apply. Overdosage: If you think you have taken too much of this medicine contact a poison control center or emergency room at once. NOTE: This medicine is only for you. Do not share this medicine with others. What if I miss a dose? This does not apply. What may interact with this medication? Certain medications for anxiety or sleep Certain medications for seizures like phenobarbital Digoxin Medications that relax muscles for surgery Narcotic medications for pain This list may not describe all possible interactions. Give your health care provider a list of all the medicines, herbs, non-prescription drugs, or dietary supplements you use. Also tell them if you smoke, drink alcohol, or use illegal drugs. Some items may interact with your medicine. What should I watch for while using this medication? Your condition will be monitored carefully while you are receiving this medication. You may need blood work done while you are receiving this medication. What side effects may I notice from  receiving this medication? Side effects that you should report to your care team as soon as possible: Allergic reactions--skin rash, itching, hives, swelling of the face, lips, tongue, or throat High magnesium level--confusion, drowsiness, facial flushing, redness, sweating, muscle weakness, fast or irregular heartbeat, trouble breathing Low blood pressure--dizziness, feeling faint or lightheaded, blurry vision Side effects that usually do not require medical attention (report to your care team if they continue or are bothersome): Headache Nausea This list may not describe all possible side effects. Call your doctor for medical advice about side effects. You may report side effects to FDA at 1-800-FDA-1088. Where should I keep my medication? This medication is given in a hospital or clinic and will not be stored at home. NOTE: This sheet is a summary. It may not cover all possible information. If you have questions about this medicine, talk to your doctor, pharmacist, or health care provider.  2022 Elsevier/Gold Standard (2020-08-31 00:00:00)

## 2021-06-06 NOTE — Progress Notes (Signed)
Hematology/Oncology [facility] note  [phone] [phone]     Clinic Day:  06/06/2021   Referring physician: Glean Hess, MD  Chief Complaint: Sarah [name] is a 65 y.o. 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   Initial bone marrow revealed 80% plasma cells.  Lambda free light chains were 1340.  She had nephrotic range proteinuria.  She initially underwent induction with RVD.  Revlimid maintenance was discontinued on 01/21/2017 secondary to intolerance.     07/19/2019 Bone marrow aspirate and biopsy on  revealed a normocellular marrow with but increased lambda-restricted plasma cells (9% aspirate, 40% CD138 immunohistochemistry).  Findings were consistent with recurrent plasma cell myeloma.  Flow cytometry revealed no monoclonal B-cell or phenotypically aberrant T-cell population. Cytogenetics were 43, XX (normal).  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    Lambda light chains have been followed: 22.2 (ratio 0.56) on 07/03/2017, 30.8 (ratio 0.78) on 09/02/2017, 36.9 (ratio 0.40) on 10/21/2017, 37.4 (ratio 0.41) on 12/16/2017, 70.7(ratio 0.31)  on 02/17/2018, 64.2 (ratio 0.27) on 04/07/2018, 78.9 (ratio 0.18) on 05/26/2018, 128.8 (ratio 0.17) on 08/06/2018, 181.5 (ratio 0.13) on 10/08/2018, 130.9 (ratio 0.13) on 10/20/2018, 160.7 (ratio 0.10)  on 12/09/2018, 236.6 (ratio 0.07) on 02/01/2019, 363.6 (ratio 0.04) on 03/22/2019, 404.8 (ratio 0.04) on 04/05/2019, 420.7 (ratio 0.03) on 05/24/2019, 573.4 (ratio 0.03) on 06/23/2019, 451.05 (ratio 0.02) on 08/20/2019, 47.2 (ratio 0.13) on 10/25/2019, 22.4 (ratio 0.21) on 11/25/2019, 16.5 (ratio 0.33) on 01/10/2020, 14.6 (ratio 0.34) on 02/07/2020, 13.1 (ratio 0.31) on 03/07/2020, 10.1 (ratio 0.38) on 04/10/2020, and 9.5 (ratio 0.21) on 06/19/2020.   24 hour UPEP on 06/03/2019 revealed kappa free light chains 95.76, lambda free light chains 1,260.71, and ratio 0.08.  24 hour UPEP on 08/23/2019 revealed total protein of 782 mg/24 hrs with lambda free light chains 1,084.16 mg/L and ratio of 0.10 (1.03-31.76).  M spike in urine was 46.1% (361 mg/24 hrs).    Bone survey on 04/08/2016 and 05/28/2017 revealed no definite lytic lesion seen in the visualized skeleton.  Bone survey on 11/19/2018 revealed no suspicious lucent lesions and no acute bony abnormality.  PET scan on 07/12/2019 revealed no focal metabolic activity to suggest active myeloma within the skeleton. There were no lytic lesions identified on the CT portion of the exam or soft tissue plasmacytomas. There was no evidence of multiple myeloma.    Pretreatment RBC phenotype on 09/23/2019 was positive for C, e, DUFFY B, KIDD B, M, S, and s antigen; negative for c, E, KELL, DUFFY A, KIDD A, and N antigen.    09/27/2019 - 10/25/2019; 12/09/2019 - 03/13/2020 6 cycles of daratumumab and hyaluronidase-fihj, Pomalyst, and Decadron (DPd) .  Cycle #1 was complicated by fever and neutropenia requiring admission.  Cycle #2 was complicated by pneumonia requiring admission.  Cycle #6 was complicated with an ER evaluation for an elevated lactic acid.   05/10/2020 second autologous stem cell transplant at [facility]  She underwent conditioning with melphalan 140 mg/m2.    She is s/p week #3 Velcade maintenance (began 08/31/2020 - 09/28/2020).  She has no increase in  neuropathy.  She has severe aortic stenosis.  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 Cardiology is following for a possible TAVR in the future.  Echo on 07/05/2020 revealed moderate to severe aortic stenosis with an EF of 50-55%.  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 Zometa was discontinued in 01/2017.  She has chronic nausea on Phenergan.     B12 deficiency.  B12 was 254 on 04/09/2017, 295 on 08/20/2018, and 391 on 10/08/2018.  She was on oral B12.  She received B12 monthly (last 09/14/2020).  Folate was 12.1 on 01/10/2020.    She has iron deficiency.  Ferritin was 32 on 07/01/2019.  She received Venofer on 07/15/2019 and 07/22/2019.   She has hypogammaglobulinemia.  IgG was 245 on 11/25/2019.  She received monthly IVIG (07/22/021 - 03/22/2020).  She received IVIG 400 mg/kg on 01/20/2020, 200 mg/kg on 02/17/2020, and 300 mg/kg on 03/22/2020.  IVIG on 02/72/5366 was complicated by acute renal failure. IgG trough level was 418 on 02/17/2020.  10/15/2019 - 10/20/2019 She was admitted to [facility] with fever and neutropenia.  Cultures were negative.  CXR was negative. She received broad spectrum antibiotics and daily Granix.  She received IVF for acute renal insufficiency due to diarrhea and dehydration.  Creatine was 1.66 on admission and 1.12 on discharge.    03/01/2020 - 03/05/2020 admitted to [facility] from with fever and neutropenia. CXR revealed no active cardiopulmonary disease. Chest CT with contrast revealed no acute intrathoracic pathology. There were findings which could be suggestive of prior granulomatous disease. She was treated with Cefepime and Vancomycin, then switched to ciprofloxacin on 03/03/2020.   05/08/2020 - 12/05/2021The patient was admitted to [facility] from  for autologous bone marrow transplant.   She received conditioning with melphalan 140 mg/m2.  Bone marrow transplant was on  05/10/2020. The patient developed fevers daily from 05/19/2020 - 05/24/2020. Blood cultures were + for strept sanguis bacteremia.  She was treated cefepime, vancomycin, and solumedrol for possible engraftment syndrome. Cefepime was switched to ceftriaxone; she completed 2 weeks of ceftriaxone on 06/03/2020. She had chemotherapy induced diarrhea.  She experienced urinary retention.  Feb 2022 patient has been on maintenance Velcade 2 mg every 2 weeks.  Chronic diarrhea seen by gastroenterology Dr. Vicente Males and was recommended to avoid artificial sugars in her diet including Diet Coke and coffee mate.  She feels some improvement of her diarrhea frequency since the dietary modification.  GI profile PCR negative, C. difficile toxin A+ B negative, fecal calprotectin 77 12/19/2020 colonoscopy showed 2 subcentimeter polyps in the ascending colon, resected and removed.  Pathology showed tubular adenoma.  No malignancy.  Normal mucosa in the entire examined colon.  Biopsied, pathology showed colonic mucosa with no significant pathological alteration.  Negative for active inflammation and features of chronicity.  Negative for microscopic colitis, dysplasia, and malignanc  Diabetes, patient has discontinued metformin due to diarrhea and started on glipizide 2.5 mg   03/05/2021-03/09/2021 Patient was hospitalized due to fever and chills, nonproductive cough, shortness of breath. X-ray showed right lower lobe atelectasis versus pneumonia.  Blood cultures negative.  Urine culture showed 60,000 colonies of Enterococcus facialis.  Patient received antibiotics. Patient also had abdominal pelvis CT scan for work-up of intermittent lower abdomen pain.No  acute abnormality.   05/14/21 last dose of Velcade maintenance.  establish care with neurology Dr. Gurney Maxin and her neuropathy regimen has been switched from gabapentin to Lyrica.  INTERVAL HISTORY Mellie Buccellato is a 65 y.o. female who has above history reviewed by  me today presents for follow up visit for management of multiple myeloma.  05/29/2021 patient got influenza and treatment appt was rescheduled to this week.  Today she feels well. No fever, chills, cough.  Chronic diarrhea, symptoms are stable.  Neuropathy symptoms, not worse.   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   Due to Zometa   Cardiomyopathy, idiopathic ([place]) 08/21/2017   Overview:  Septal and apical hypokinesis with ef 40%   CHF (congestive heart failure) (HCC)    CKD (chronic kidney disease) stage 3, GFR 30-59 ml/min (HCC)    Depression    Diabetes mellitus (HCC)    Dizziness    Fatty liver    Frequent falls    GERD (gastroesophageal reflux disease)    Gout    Heart murmur    History of blood transfusion    History of bone marrow transplant ([place])    History of uterine fibroid    Hx of cardiac catheterization 06/05/2016   Overview:  Normal coronaries 2017   Hypertension    Hypomagnesemia    IDA (iron deficiency anemia)    Multiple myeloma ([place])    Personal history of chemotherapy    Renal cyst    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[place]) 06/05/2016   Overview:  Sp ablation 1999    Past Surgical History:  Procedure Laterality Date   ABDOMINAL HYSTERECTOMY     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   CARDIAC ELECTROPHYSIOLOGY MAPPING AND ABLATION     CARPAL TUNNEL RELEASE Bilateral    CHOLECYSTECTOMY  2008   COLONOSCOPY WITH PROPOFOL N/A 05/07/2017   Procedure: COLONOSCOPY WITH PROPOFOL;  Surgeon: [name], MD;  Location: [facility] ENDOSCOPY;  Service: Gastroenterology;  Laterality: N/A;   COLONOSCOPY WITH PROPOFOL N/A 12/19/2020   Procedure: COLONOSCOPY WITH PROPOFOL;  Surgeon: [name], MD;  Location: [facility] ENDOSCOPY;  Service: Gastroenterology;   Laterality: N/A;   ESOPHAGOGASTRODUODENOSCOPY (EGD) WITH PROPOFOL N/A 05/07/2017   Procedure: ESOPHAGOGASTRODUODENOSCOPY (EGD) WITH PROPOFOL;  Surgeon: [name], MD;  Location: [facility] ENDOSCOPY;  Service: Gastroenterology;  Laterality: N/A;   FOOT SURGERY Bilateral    INCONTINENCE SURGERY  2009   INTERSTIM IMPLANT PLACEMENT     other     over active bladder   OTHER SURGICAL HISTORY     bladder stimulator    PARTIAL HYSTERECTOMY  03/1996   fibroids   PORTA CATH INSERTION N/A 03/10/2019   Procedure: PORTA CATH INSERTION;  Surgeon: Algernon Huxley, MD;  Location: [facility] CV LAB;  Service: Cardiovascular;  Laterality: N/A;   TONSILLECTOMY  2007    Family History  Problem Relation Age of Onset   Colon cancer Father    Renal Disease Father    Diabetes Mellitus II Father    Melanoma Paternal Grandmother    Breast cancer Maternal Aunt 78   Anemia Mother    Heart disease Mother    Heart failure Mother    Renal Disease Mother    Congestive Heart Failure Mother  Heart disease Maternal Uncle    Throat cancer Maternal Uncle    Lung cancer Maternal Uncle    Liver disease Maternal Uncle    Heart failure Maternal Uncle    Hearing loss Son 41       Suicide     Social History:  reports that she quit smoking about 29 years ago. Her smoking use included cigarettes. She has a 20.00 pack-year smoking history. She has never used smokeless tobacco. She reports current alcohol use. She reports that she does not use drugs.  She is on disability. She notes exposure to perchloroethylene [facility]).   Allergies:  Allergies  Allergen Reactions   Oxycodone-Acetaminophen Anaphylaxis    Swelling and rash   Celebrex [Celecoxib] Diarrhea   Codeine    Plerixafor     In 2016 during ASCT collection patient developed fever to 103.11F and required hospitalization   Benadryl [Diphenhydramine] Palpitations   Morphine Itching and Rash   Ondansetron Diarrhea   Tylenol [Acetaminophen] Itching and Rash     Current Medications: Current Outpatient Medications  Medication Sig Dispense Refill   montelukast (SINGULAIR) 10 MG tablet Take 1 tablet (10 mg total) by mouth See admin instructions. Take 1 tab daily for 2 days after daratumumab treatments 30 tablet 1   acetaminophen (TYLENOL) 500 MG tablet Take 500 mg by mouth every 6 (six) hours as needed.     acyclovir (ZOVIRAX) 400 MG tablet Take 1 tablet (400 mg total) by mouth 2 (two) times daily. 180 tablet 1   albuterol (VENTOLIN HFA) 108 (90 Base) MCG/ACT inhaler Inhale 2 puffs into the lungs every 6 (six) hours as needed for wheezing or shortness of breath. 8 g 0   allopurinol (ZYLOPRIM) 100 MG tablet TAKE 1 TABLET(100 MG) BY MOUTH DAILY as needed 90 tablet 1   bisoprolol (ZEBETA) 5 MG tablet Take 5 mg by mouth daily.     diclofenac sodium (VOLTAREN) 1 % GEL Apply 2 g topically 4 (four) times daily. 100 g 1   FLUoxetine (PROZAC) 40 MG capsule TAKE 1 CAPSULE EVERY DAY 90 capsule 0   fluticasone (FLONASE) 50 MCG/ACT nasal spray Place 2 sprays into both nostrils daily. 16 g 2   glucose blood test strip      Multiple Vitamin (MULTIVITAMIN) tablet Take 1 tablet by mouth daily.     Multiple Vitamins-Minerals (HAIR/SKIN/NAILS) TABS Take 1 tablet by mouth daily.     omeprazole (PRILOSEC) 40 MG capsule TAKE 1 CAPSULE (40 MG TOTAL) BY MOUTH IN THE MORNING AND AT BEDTIME. 180 capsule 1   oseltamivir (TAMIFLU) 75 MG capsule Take 1 capsule (75 mg total) by mouth daily. 10 capsule 0   pregabalin (LYRICA) 25 MG capsule Take 25 mg by mouth in the morning and at bedtime.     promethazine (PHENERGAN) 25 MG tablet TAKE 1 TABLET EVERY 6 HOURS AS NEEDED FOR NAUSEA OR VOMITING 90 tablet 1   promethazine-dextromethorphan (PROMETHAZINE-DM) 6.25-15 MG/5ML syrup Take 5 mLs by mouth 4 (four) times daily as needed for cough. 180 mL 0   sucralfate (CARAFATE) 1 g tablet Take 1 tablet (1 g total) by mouth 4 (four) times daily -  with meals and at bedtime. 120 tablet 2    tiZANidine (ZANAFLEX) 2 MG tablet Take 1 tablet (2 mg total) by mouth every 6 (six) hours as needed for muscle spasms. 30 tablet 1   traZODone (DESYREL) 100 MG tablet TAKE 1 TABLET AT BEDTIME 90 tablet 0   No current facility-administered medications  for this visit.   Facility-Administered Medications Ordered in Other Visits  Medication Dose Route Frequency Provider Last Rate Last Admin   0.9 %  sodium chloride infusion   Intravenous Once Earlie Server, MD       0.9 %  sodium chloride infusion   Intravenous Once Corcoran, Drue Second, MD       0.9 %  sodium chloride infusion   Intravenous Continuous Earlie Server, MD       daratumumab-hyaluronidase-fihj [facility] FASPRO) 1800-30000 MG-UT/15ML chemo SQ injection 1,800 mg  1,800 mg Subcutaneous Once Earlie Server, MD       dexamethasone (DECADRON) tablet 20 mg  20 mg Oral Once Earlie Server, MD       diphenhydrAMINE (BENADRYL) capsule 25 mg  25 mg Oral Once Earlie Server, MD       heparin lock flush 100 unit/mL  500 Units Intravenous Once Earlie Server, MD       heparin lock flush 100 unit/mL  500 Units Intracatheter Once PRN Lequita Asal, MD       magnesium sulfate 6 g in sodium chloride 0.9 % 500 mL  6 g Intravenous Once Earlie Server, MD       montelukast (SINGULAIR) tablet 10 mg  10 mg Oral Once Earlie Server, MD       promethazine (PHENERGAN) 25 mg in sodium chloride 0.9 % 50 mL IVPB  25 mg Intravenous Once Earlie Server, MD       sodium chloride flush (NS) 0.9 % injection 10 mL  10 mL Intravenous PRN Earlie Server, MD   10 mL at 04/02/21 0932    Review of Systems  Constitutional:  Negative for chills, fever, malaise/fatigue and weight loss.  HENT:  Negative for sore throat.   Eyes:  Negative for redness.  Respiratory:  Negative for cough, shortness of breath and wheezing.   Cardiovascular:  Negative for chest pain, palpitations and leg swelling.  Gastrointestinal:  Positive for diarrhea and nausea. Negative for abdominal pain, blood in stool and vomiting.  Genitourinary:   Negative for dysuria.  Musculoskeletal:  Positive for back pain and joint pain. Negative for myalgias.  Skin:  Negative for rash.  Neurological:  Positive for tingling and sensory change. Negative for dizziness and tremors.  Endo/Heme/Allergies:  Does not bruise/bleed easily.  Psychiatric/Behavioral:  Negative for hallucinations.    Performance status (ECOG): 1  Vitals Blood pressure (!) 159/83, pulse 85, temperature (!) 96.1 F (35.6 C), resp. rate 18, height _0  (1.575 m), weight 159 lb 12.8 oz (72.5 kg).  Physical Exam Vitals and nursing note reviewed.  Constitutional:      General: She is not in acute distress.    Appearance: She is well-developed. She is not ill-appearing, toxic-appearing or diaphoretic.     Interventions: Face mask in place.  HENT:     Head: Normocephalic and atraumatic.     Mouth/Throat:     Mouth: Mucous membranes are moist.     Pharynx: Oropharynx is clear.  Eyes:     General: No scleral icterus.    Pupils: Pupils are equal, round, and reactive to light.  Cardiovascular:     Rate and Rhythm: Normal rate and regular rhythm.     Heart sounds: Normal heart sounds. No murmur heard. Pulmonary:     Effort: Pulmonary effort is normal. No respiratory distress.     Breath sounds: Normal breath sounds. No wheezing or rales.  Chest:     Chest wall: No tenderness.  Abdominal:  General: Bowel sounds are normal. There is no distension.     Palpations: Abdomen is soft. There is no hepatomegaly, splenomegaly or mass.     Tenderness: There is no abdominal tenderness. There is no guarding or rebound.  Musculoskeletal:        General: No tenderness. Normal range of motion.     Cervical back: Normal range of motion and neck supple.     Right lower leg: No edema.     Left lower leg: No edema.  Lymphadenopathy:     Head:     Right side of head: No preauricular, posterior auricular or occipital adenopathy.     Left side of head: No preauricular, posterior  auricular or occipital adenopathy.     Cervical: No cervical adenopathy.     Upper Body:     Right upper body: No supraclavicular adenopathy.     Left upper body: No supraclavicular adenopathy.  Skin:    General: Skin is warm and dry.  Neurological:     Mental Status: She is alert and oriented to person, place, and time. Mental status is at baseline.  Psychiatric:        Mood and Affect: Mood normal.   Laboratory data Infusion on 06/06/2021  Component Date Value Ref Range Status   Magnesium 06/06/2021 1.0 (L)  1.7 - 2.4 mg/dL Final   Performed at [facility], [place], [place] [address]   Sodium 06/06/2021 137  135 - 145 mmol/L Final   Potassium 06/06/2021 3.7  3.5 - 5.1 mmol/L Final   Chloride 06/06/2021 102  98 - 111 mmol/L Final   CO2 06/06/2021 23  22 - 32 mmol/L Final   Glucose, Bld 06/06/2021 163 (H)  70 - 99 mg/dL Final   Glucose reference range applies only to samples taken after fasting for at least 8 hours.   BUN 06/06/2021 22  8 - 23 mg/dL Final   Creatinine, Ser 06/06/2021 1.07 (H)  0.44 - 1.00 mg/dL Final   Calcium 06/06/2021 8.9  8.9 - 10.3 mg/dL Final   Total Protein 06/06/2021 7.5  6.5 - 8.1 g/dL Final   Albumin 06/06/2021 4.4  3.5 - 5.0 g/dL Final   AST 06/06/2021 46 (H)  15 - 41 U/L Final   ALT 06/06/2021 52 (H)  0 - 44 U/L Final   Alkaline Phosphatase 06/06/2021 116  38 - 126 U/L Final   Total Bilirubin 06/06/2021 0.4  0.3 - 1.2 mg/dL Final   GFR, Estimated 06/06/2021 58 (L)  >60 mL/min Final   Comment: (NOTE) Calculated using the CKD-EPI Creatinine Equation (2021)    Anion gap 06/06/2021 12  5 - 15 Final   Performed at [facility], [place], [place] [address]   WBC 06/06/2021 6.7  4.0 - 10.5 K/uL Final   RBC 06/06/2021 4.57  3.87 - 5.11 MIL/uL Final   Hemoglobin 06/06/2021 12.4  12.0 - 15.0 g/dL Final   HCT 06/06/2021 37.4  36.0 - 46.0 % Final   MCV 06/06/2021 81.8  80.0 - 100.0 fL Final   MCH 06/06/2021 27.1   26.0 - 34.0 pg Final   MCHC 06/06/2021 33.2  30.0 - 36.0 g/dL Final   RDW 06/06/2021 13.2  11.5 - 15.5 % Final   Platelets 06/06/2021 134 (L)  150 - 400 K/uL Final   nRBC 06/06/2021 0.0  0.0 - 0.2 % Final   Neutrophils Relative % 06/06/2021 75  % Final   Neutro  Abs 06/06/2021 5.0  1.7 - 7.7 K/uL Final   Lymphocytes Relative 06/06/2021 16  % Final   Lymphs Abs 06/06/2021 1.1  0.7 - 4.0 K/uL Final   Monocytes Relative 06/06/2021 6  % Final   Monocytes Absolute 06/06/2021 0.4  0.1 - 1.0 K/uL Final   Eosinophils Relative 06/06/2021 2  % Final   Eosinophils Absolute 06/06/2021 0.1  0.0 - 0.5 K/uL Final   Basophils Relative 06/06/2021 0  % Final   Basophils Absolute 06/06/2021 0.0  0.0 - 0.1 K/uL Final   Immature Granulocytes 06/06/2021 1  % Final   Abs Immature Granulocytes 06/06/2021 0.04  0.00 - 0.07 K/uL Final   Performed at [facility], [address] [place], [place] [address]    Assessment/Plan 1. Multiple myeloma in remission ([place])   2. Encounter for antineoplastic chemotherapy   3. 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 B12 deficiency     # Recurrent lambda light chain multiple myeloma s/p second autologous bone marrow transplant [05/10/2020] Most recent Multiple myeloma showed negative M protein.  Light chain ratio is normal. 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  Attention on follow-up. Shared decision was made to switch her maintenance treatment to Daratumumab injections due to her worsened neuropathy.she has previously tolerated without any problems.  Singulair 8m once prior to injection, and I recommend her to take daily x 2 days after each Daratumumab injections.Dexamethasone 218mx 1.   Continue prophylaxis with Valtrex up to 1 year after transplant-[facility]  ([phone]  #Hyperglobulinemia post stem cell transplantation.  May consider  IVIG replacement therapy if she has frequent infections. .-most recent IgG levels are normal.  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   Follows up with neurology.  Switched to Lyrica.  #chronic diarrhea, Gastroenterology recommendation was reviewed.  Avoid artificial sugar.  Colonoscopy showed no microscopic colitis. Her symptom seems to have improved slightly after dietary modification. Off Velcade now.   #Chronic hypomagnesemia Magnesium is 1.0 today, proceed with  IV magnesium today. 6 g Continue weekly magnesium +/- IV magnesium.  Patient reports that she has been taking Slow-Mag recently.  #Dysphagia, chronic nausea, patient request to have IV Phenergan in the clinic with treatment.    # B12 deficiency, patient has been on vitamin B12 monthly. Last dose 11/09/2020. Vitamin B12 is 404 continue B12 injections monthly.   RTC: weekly  lab magnesium/IV magnesium x  3 B12 monthly x 6 RTC in 4 weeks for labs (CBC with diff, CMP, Mg ), md  for Daratumumab and IV Mg   I discussed the assessment and treatment plan with the patient.  The patient was provided an opportunity to ask questions and all were answered.  The patient agreed with the plan and demonstrated an understanding of the instructions.  The patient was advised to call back if the symptoms worsen or if the condition fails to improve as anticipated.   ZhEarlie ServerMD, PhD 06/06/2021

## 2021-06-07 DIAGNOSIS — M542 Cervicalgia: Secondary | ICD-10-CM | POA: Diagnosis not present

## 2021-06-07 DIAGNOSIS — G620 Drug-induced polyneuropathy: Secondary | ICD-10-CM | POA: Diagnosis not present

## 2021-06-07 DIAGNOSIS — R202 Paresthesia of skin: Secondary | ICD-10-CM | POA: Diagnosis not present

## 2021-06-07 DIAGNOSIS — R200 Anesthesia of skin: Secondary | ICD-10-CM | POA: Diagnosis not present

## 2021-06-07 DIAGNOSIS — M545 Low back pain, unspecified: Secondary | ICD-10-CM | POA: Diagnosis not present

## 2021-06-07 DIAGNOSIS — T451X5A Adverse effect of antineoplastic and immunosuppressive drugs, initial encounter: Secondary | ICD-10-CM | POA: Diagnosis not present

## 2021-06-07 LAB — KAPPA/LAMBDA LIGHT CHAINS
Kappa free light chain: 16.5 mg/L (ref 3.3–19.4)
Kappa, lambda light chain ratio: 0.85 (ref 0.26–1.65)
Lambda free light chains: 19.3 mg/L (ref 5.7–26.3)

## 2021-06-11 LAB — MULTIPLE MYELOMA PANEL, SERUM
Albumin SerPl Elph-Mcnc: 3.8 g/dL (ref 2.9–4.4)
Albumin/Glob SerPl: 1.3 (ref 0.7–1.7)
Alpha 1: 0.3 g/dL (ref 0.0–0.4)
Alpha2 Glob SerPl Elph-Mcnc: 1.1 g/dL — ABNORMAL HIGH (ref 0.4–1.0)
B-Globulin SerPl Elph-Mcnc: 1 g/dL (ref 0.7–1.3)
Gamma Glob SerPl Elph-Mcnc: 0.7 g/dL (ref 0.4–1.8)
Globulin, Total: 3.1 g/dL (ref 2.2–3.9)
IgA: 104 mg/dL (ref 87–352)
IgG (Immunoglobin G), Serum: 740 mg/dL (ref 586–1602)
IgM (Immunoglobulin M), Srm: 49 mg/dL (ref 26–217)
Total Protein ELP: 6.9 g/dL (ref 6.0–8.5)

## 2021-06-11 LAB — HEMOGLOBIN A1C
Hgb A1c MFr Bld: 7.3 % — ABNORMAL HIGH (ref 4.8–5.6)
Mean Plasma Glucose: 163 mg/dL

## 2021-06-14 ENCOUNTER — Inpatient Hospital Stay: Payer: Medicare HMO

## 2021-06-14 ENCOUNTER — Other Ambulatory Visit: Payer: Self-pay

## 2021-06-14 DIAGNOSIS — Z5112 Encounter for antineoplastic immunotherapy: Secondary | ICD-10-CM | POA: Diagnosis not present

## 2021-06-14 DIAGNOSIS — E538 Deficiency of other specified B group vitamins: Secondary | ICD-10-CM | POA: Diagnosis not present

## 2021-06-14 DIAGNOSIS — D801 Nonfamilial hypogammaglobulinemia: Secondary | ICD-10-CM | POA: Diagnosis not present

## 2021-06-14 DIAGNOSIS — R808 Other proteinuria: Secondary | ICD-10-CM | POA: Diagnosis not present

## 2021-06-14 DIAGNOSIS — R200 Anesthesia of skin: Secondary | ICD-10-CM | POA: Diagnosis not present

## 2021-06-14 DIAGNOSIS — C9000 Multiple myeloma not having achieved remission: Secondary | ICD-10-CM | POA: Diagnosis not present

## 2021-06-14 DIAGNOSIS — G629 Polyneuropathy, unspecified: Secondary | ICD-10-CM | POA: Diagnosis not present

## 2021-06-14 DIAGNOSIS — E118 Type 2 diabetes mellitus with unspecified complications: Secondary | ICD-10-CM

## 2021-06-14 DIAGNOSIS — E119 Type 2 diabetes mellitus without complications: Secondary | ICD-10-CM | POA: Diagnosis not present

## 2021-06-14 DIAGNOSIS — I10 Essential (primary) hypertension: Secondary | ICD-10-CM | POA: Diagnosis not present

## 2021-06-14 LAB — MAGNESIUM: Magnesium: 1.2 mg/dL — ABNORMAL LOW (ref 1.7–2.4)

## 2021-06-14 MED ORDER — MAGNESIUM SULFATE 2 GM/50ML IV SOLN
2.0000 g | Freq: Once | INTRAVENOUS | Status: AC
  Administered 2021-06-14: 2 g via INTRAVENOUS
  Filled 2021-06-14: qty 50

## 2021-06-14 MED ORDER — MAGNESIUM SULFATE 4 GM/100ML IV SOLN
4.0000 g | Freq: Once | INTRAVENOUS | Status: AC
  Administered 2021-06-14: 4 g via INTRAVENOUS
  Filled 2021-06-14: qty 100

## 2021-06-14 MED ORDER — SODIUM CHLORIDE 0.9 % IV SOLN
25.0000 mg | Freq: Once | INTRAVENOUS | Status: AC
  Administered 2021-06-14: 25 mg via INTRAVENOUS
  Filled 2021-06-14: qty 1

## 2021-06-14 MED ORDER — SODIUM CHLORIDE 0.9% FLUSH
10.0000 mL | INTRAVENOUS | Status: DC | PRN
  Administered 2021-06-14: 10 mL via INTRAVENOUS
  Filled 2021-06-14: qty 10

## 2021-06-14 MED ORDER — CYANOCOBALAMIN 1000 MCG/ML IJ SOLN
1000.0000 ug | Freq: Once | INTRAMUSCULAR | Status: AC
  Administered 2021-06-14: 1000 ug via INTRAMUSCULAR
  Filled 2021-06-14: qty 1

## 2021-06-14 MED ORDER — HEPARIN SOD (PORK) LOCK FLUSH 100 UNIT/ML IV SOLN
500.0000 [IU] | Freq: Once | INTRAVENOUS | Status: AC
  Administered 2021-06-14: 500 [IU] via INTRAVENOUS
  Filled 2021-06-14: qty 5

## 2021-06-14 MED ORDER — SODIUM CHLORIDE 0.9 % IV SOLN
6.0000 g | Freq: Once | INTRAVENOUS | Status: DC
  Filled 2021-06-14: qty 12

## 2021-06-14 MED ORDER — SODIUM CHLORIDE 0.9 % IV SOLN
Freq: Once | INTRAVENOUS | Status: AC
  Filled 2021-06-14: qty 250

## 2021-06-14 NOTE — Patient Instructions (Signed)
Magnesium Sulfate Injection What is this medication? MAGNESIUM SULFATE (mag NEE zee um SUL fate) prevents and treats low levels of magnesium in your body. It may also be used to prevent and treat seizures during pregnancy in people with high blood pressure disorders, such as preeclampsia or eclampsia. Magnesium plays an important role in maintaining the health of your muscles and nervous system. This medicine may be used for other purposes; ask your health care provider or pharmacist if you have questions. What should I tell my care team before I take this medication? They need to know if you have any of these conditions: Heart disease History of irregular heart beat Kidney disease An unusual or allergic reaction to magnesium sulfate, medications, foods, dyes, or preservatives Pregnant or trying to get pregnant Breast-feeding How should I use this medication? This medication is for infusion into a vein. It is given in a hospital or clinic setting. Talk to your care team about the use of this medication in children. While this medication may be prescribed for selected conditions, precautions do apply. Overdosage: If you think you have taken too much of this medicine contact a poison control center or emergency room at once. NOTE: This medicine is only for you. Do not share this medicine with others. What if I miss a dose? This does not apply. What may interact with this medication? Certain medications for anxiety or sleep Certain medications for seizures like phenobarbital Digoxin Medications that relax muscles for surgery Narcotic medications for pain This list may not describe all possible interactions. Give your health care provider a list of all the medicines, herbs, non-prescription drugs, or dietary supplements you use. Also tell them if you smoke, drink alcohol, or use illegal drugs. Some items may interact with your medicine. What should I watch for while using this medication? Your  condition will be monitored carefully while you are receiving this medication. You may need blood work done while you are receiving this medication. What side effects may I notice from receiving this medication? Side effects that you should report to your care team as soon as possible: Allergic reactions--skin rash, itching, hives, swelling of the face, lips, tongue, or throat High magnesium level--confusion, drowsiness, facial flushing, redness, sweating, muscle weakness, fast or irregular heartbeat, trouble breathing Low blood pressure--dizziness, feeling faint or lightheaded, blurry vision Side effects that usually do not require medical attention (report to your care team if they continue or are bothersome): Headache Nausea This list may not describe all possible side effects. Call your doctor for medical advice about side effects. You may report side effects to FDA at 1-800-FDA-1088. Where should I keep my medication? This medication is given in a hospital or clinic and will not be stored at home. NOTE: This sheet is a summary. It may not cover all possible information. If you have questions about this medicine, talk to your doctor, pharmacist, or health care provider.  2022 Elsevier/Gold Standard (2020-08-31 00:00:00)  

## 2021-06-15 LAB — HEMOGLOBIN A1C
Hgb A1c MFr Bld: 7.5 % — ABNORMAL HIGH (ref 4.8–5.6)
Mean Plasma Glucose: 169 mg/dL

## 2021-06-21 ENCOUNTER — Other Ambulatory Visit: Payer: Self-pay

## 2021-06-21 ENCOUNTER — Other Ambulatory Visit: Payer: Medicare HMO

## 2021-06-21 ENCOUNTER — Inpatient Hospital Stay: Payer: Medicare HMO

## 2021-06-21 DIAGNOSIS — E119 Type 2 diabetes mellitus without complications: Secondary | ICD-10-CM | POA: Diagnosis not present

## 2021-06-21 DIAGNOSIS — C9000 Multiple myeloma not having achieved remission: Secondary | ICD-10-CM

## 2021-06-21 DIAGNOSIS — I10 Essential (primary) hypertension: Secondary | ICD-10-CM | POA: Diagnosis not present

## 2021-06-21 DIAGNOSIS — G629 Polyneuropathy, unspecified: Secondary | ICD-10-CM | POA: Diagnosis not present

## 2021-06-21 DIAGNOSIS — R808 Other proteinuria: Secondary | ICD-10-CM | POA: Diagnosis not present

## 2021-06-21 DIAGNOSIS — Z5112 Encounter for antineoplastic immunotherapy: Secondary | ICD-10-CM | POA: Diagnosis not present

## 2021-06-21 DIAGNOSIS — E538 Deficiency of other specified B group vitamins: Secondary | ICD-10-CM | POA: Diagnosis not present

## 2021-06-21 DIAGNOSIS — D801 Nonfamilial hypogammaglobulinemia: Secondary | ICD-10-CM | POA: Diagnosis not present

## 2021-06-21 LAB — MAGNESIUM: Magnesium: 1.2 mg/dL — ABNORMAL LOW (ref 1.7–2.4)

## 2021-06-21 MED ORDER — HEPARIN SOD (PORK) LOCK FLUSH 100 UNIT/ML IV SOLN
500.0000 [IU] | Freq: Once | INTRAVENOUS | Status: AC
  Administered 2021-06-21: 17:00:00 500 [IU] via INTRAVENOUS
  Filled 2021-06-21: qty 5

## 2021-06-21 MED ORDER — MAGNESIUM SULFATE 2 GM/50ML IV SOLN
2.0000 g | Freq: Once | INTRAVENOUS | Status: AC
  Administered 2021-06-21: 16:00:00 2 g via INTRAVENOUS
  Filled 2021-06-21: qty 50

## 2021-06-21 MED ORDER — SODIUM CHLORIDE 0.9 % IV SOLN: 6.0000 g | Freq: Once | INTRAVENOUS | Status: DC

## 2021-06-21 MED ORDER — MAGNESIUM SULFATE 4 GM/100ML IV SOLN
4.0000 g | Freq: Once | INTRAVENOUS | Status: AC
  Administered 2021-06-21: 14:00:00 4 g via INTRAVENOUS
  Filled 2021-06-21: qty 100

## 2021-06-21 MED ORDER — SODIUM CHLORIDE 0.9 % IV SOLN
25.0000 mg | Freq: Once | INTRAVENOUS | Status: AC
  Administered 2021-06-21: 13:00:00 25 mg via INTRAVENOUS
  Filled 2021-06-21: qty 1

## 2021-06-21 MED ORDER — SODIUM CHLORIDE 0.9% FLUSH
10.0000 mL | Freq: Once | INTRAVENOUS | Status: AC
  Administered 2021-06-21: 13:00:00 10 mL via INTRAVENOUS
  Filled 2021-06-21: qty 10

## 2021-06-21 MED ORDER — SODIUM CHLORIDE 0.9 % IV SOLN
Freq: Once | INTRAVENOUS | Status: AC
  Filled 2021-06-21: qty 250

## 2021-06-21 NOTE — Patient Instructions (Signed)
Magnesium Sulfate Injection What is this medication? MAGNESIUM SULFATE (mag NEE zee um SUL fate) prevents and treats low levels of magnesium in your body. It may also be used to prevent and treat seizures during pregnancy in people with high blood pressure disorders, such as preeclampsia or eclampsia. Magnesium plays an important role in maintaining the health of your muscles and nervous system. This medicine may be used for other purposes; ask your health care provider or pharmacist if you have questions. What should I tell my care team before I take this medication? They need to know if you have any of these conditions: Heart disease History of irregular heart beat Kidney disease An unusual or allergic reaction to magnesium sulfate, medications, foods, dyes, or preservatives Pregnant or trying to get pregnant Breast-feeding How should I use this medication? This medication is for infusion into a vein. It is given in a hospital or clinic setting. Talk to your care team about the use of this medication in children. While this medication may be prescribed for selected conditions, precautions do apply. Overdosage: If you think you have taken too much of this medicine contact a poison control center or emergency room at once. NOTE: This medicine is only for you. Do not share this medicine with others. What if I miss a dose? This does not apply. What may interact with this medication? Certain medications for anxiety or sleep Certain medications for seizures like phenobarbital Digoxin Medications that relax muscles for surgery Narcotic medications for pain This list may not describe all possible interactions. Give your health care provider a list of all the medicines, herbs, non-prescription drugs, or dietary supplements you use. Also tell them if you smoke, drink alcohol, or use illegal drugs. Some items may interact with your medicine. What should I watch for while using this medication? Your  condition will be monitored carefully while you are receiving this medication. You may need blood work done while you are receiving this medication. What side effects may I notice from receiving this medication? Side effects that you should report to your care team as soon as possible: Allergic reactions--skin rash, itching, hives, swelling of the face, lips, tongue, or throat High magnesium level--confusion, drowsiness, facial flushing, redness, sweating, muscle weakness, fast or irregular heartbeat, trouble breathing Low blood pressure--dizziness, feeling faint or lightheaded, blurry vision Side effects that usually do not require medical attention (report to your care team if they continue or are bothersome): Headache Nausea This list may not describe all possible side effects. Call your doctor for medical advice about side effects. You may report side effects to FDA at 1-800-FDA-1088. Where should I keep my medication? This medication is given in a hospital or clinic and will not be stored at home. NOTE: This sheet is a summary. It may not cover all possible information. If you have questions about this medicine, talk to your doctor, pharmacist, or health care provider.  2022 Elsevier/Gold Standard (2020-08-31 00:00:00)  

## 2021-06-22 ENCOUNTER — Ambulatory Visit (INDEPENDENT_AMBULATORY_CARE_PROVIDER_SITE_OTHER): Payer: Medicare HMO | Admitting: Internal Medicine

## 2021-06-22 ENCOUNTER — Encounter: Payer: Self-pay | Admitting: Internal Medicine

## 2021-06-22 VITALS — BP 142/80 | HR 86 | Ht 62.0 in | Wt 160.0 lb

## 2021-06-22 DIAGNOSIS — K0889 Other specified disorders of teeth and supporting structures: Secondary | ICD-10-CM | POA: Diagnosis not present

## 2021-06-22 DIAGNOSIS — J069 Acute upper respiratory infection, unspecified: Secondary | ICD-10-CM

## 2021-06-22 LAB — POC COVID19 BINAXNOW: SARS Coronavirus 2 Ag: NEGATIVE

## 2021-06-22 MED ORDER — PROMETHAZINE-DM 6.25-15 MG/5ML PO SYRP: 5.0000 mL | ORAL_SOLUTION | Freq: Four times a day (QID) | ORAL | 0 refills | Status: DC | PRN

## 2021-06-22 MED ORDER — TRAMADOL HCL 50 MG PO TABS: 50.0000 mg | ORAL_TABLET | Freq: Three times a day (TID) | ORAL | 0 refills | Status: AC | PRN

## 2021-06-22 NOTE — Progress Notes (Signed)
Date:  06/22/2021   Name:  Sarah [name]   DOB:  July 15, 1955   MRN:  [id number]   Chief Complaint: Cough  Cough This is a new problem. The current episode started more than 1 month ago. The problem has been waxing and waning. The cough is Non-productive. Associated symptoms include ear pain, nasal congestion, postnasal drip, rhinorrhea, a sore throat and shortness of breath. Pertinent negatives include no chest pain, chills, fever or headaches.  Dental Pain  This is a new problem. The current episode started in the past 7 days. The problem occurs constantly (recent gum surgery for bone fragments). The problem has been unchanged. The pain is moderate. Pertinent negatives include no fever.   Lab Results  Component Value Date   NA 137 06/06/2021   K 3.7 06/06/2021   CO2 23 06/06/2021   GLUCOSE 163 (H) 06/06/2021   BUN 22 06/06/2021   CREATININE 1.07 (H) 06/06/2021   CALCIUM 8.9 06/06/2021   GFRNONAA 58 (L) 06/06/2021   Lab Results  Component Value Date   CHOL 177 12/05/2020   HDL 59 12/05/2020   LDLCALC 94 12/05/2020   TRIG 140 12/05/2020   CHOLHDL 3.0 12/05/2020   Lab Results  Component Value Date   TSH 2.105 08/20/2018   Lab Results  Component Value Date   HGBA1C 7.5 (H) 06/14/2021   Lab Results  Component Value Date   WBC 6.7 06/06/2021   HGB 12.4 06/06/2021   HCT 37.4 06/06/2021   MCV 81.8 06/06/2021   PLT 134 (L) 06/06/2021   Lab Results  Component Value Date   ALT 52 (H) 06/06/2021   AST 46 (H) 06/06/2021   ALKPHOS 116 06/06/2021   BILITOT 0.4 06/06/2021   Lab Results  Component Value Date   VD25OH 29.8 (L) 04/09/2017     Review of Systems  Constitutional:  Positive for fatigue. Negative for chills and fever.  HENT:  Positive for congestion, ear pain, postnasal drip, rhinorrhea and sore throat.   Eyes:  Negative for visual disturbance.  Respiratory:  Positive for cough and shortness of breath.   Cardiovascular:  Negative for chest pain and  palpitations.  Gastrointestinal:  Negative for diarrhea, nausea and vomiting.  Neurological:  Negative for dizziness and headaches.  Psychiatric/Behavioral:  Negative for dysphoric mood and sleep disturbance. The patient is not nervous/anxious.    Patient Active Problem List   Diagnosis Date Noted   Multiple myeloma in remission ([place]) 05/14/2021   Leg swelling 04/02/2021   Neuropathy 04/02/2021   Fever in adult 03/05/2021   PVC (premature ventricular contraction) 12/04/2020   Diarrhea 10/25/2020   Encounter for antineoplastic chemotherapy 08/31/2020   Coronary artery disease involving native coronary artery of native heart 04/26/2020   Yeast vaginitis 03/18/2020   Hypokalemia 03/11/2020   Dyspnea 03/02/2020   Physical deconditioning 02/07/2020   Impaired nutrition 02/07/2020   Encounter for antineoplastic immunotherapy 10/03/2019   Hypocalcemia 08/28/2019   Stage 3 chronic kidney disease ([place]) 05/12/2019   Chronic nausea 12/17/2018   Chemotherapy-induced neuropathy ([place]) 12/17/2018   Hyperlipidemia associated with type 2 diabetes mellitus ([place]) 11/25/2018   Anemia 10/21/2018   Hypomagnesemia 08/20/2018   Goals of care, counseling/discussion 08/20/2018   B12 deficiency 08/20/2018   Thrombocytopenia ([place]) 02/09/2018   Benign essential HTN 08/21/2017   Carotid artery stenosis, asymptomatic, bilateral 08/06/2017   Thyroid nodule 08/06/2017   Iron deficiency anemia due to chronic blood loss 05/13/2017   Spinal stenosis of lumbar region with neurogenic claudication  04/09/2017   Long term prescription opiate use 09/07/2016   Chronic pain syndrome 07/08/2016   Pain medication agreement signed 07/08/2016   Spondylosis of lumbar region without myelopathy or radiculopathy 07/08/2016   Severe aortic stenosis 06/05/2016   Environmental and seasonal allergies 04/19/2016   Insomnia 04/19/2016   Asthma 04/19/2016   Aortic regurgitation 04/19/2016   Type II diabetes mellitus with  complication ([place]) 83/29/1916   Depression, major, single episode, in partial remission ([place]) 04/12/2016   Irritable bowel syndrome (IBS) 04/12/2016   Hepatic steatosis 04/12/2016   Multiple myeloma not having achieved remission ([place]) 04/02/2016   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[place]) 07/06/2015   Stem cell transplant candidate 05/16/2015    Allergies  Allergen Reactions   Oxycodone-Acetaminophen Anaphylaxis    Swelling and rash   Celebrex [Celecoxib] Diarrhea   Codeine    Plerixafor     In 2016 during ASCT collection patient developed fever to 103.71F and required hospitalization   Benadryl [Diphenhydramine] Palpitations   Morphine Itching and Rash   Ondansetron Diarrhea   Tylenol [Acetaminophen] Itching and Rash    Past Surgical History:  Procedure Laterality Date   ABDOMINAL HYSTERECTOMY     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   CARDIAC ELECTROPHYSIOLOGY MAPPING AND ABLATION     CARPAL TUNNEL RELEASE Bilateral    CHOLECYSTECTOMY  2008   COLONOSCOPY WITH PROPOFOL N/A 05/07/2017   Procedure: COLONOSCOPY WITH PROPOFOL;  Surgeon: [name], MD;  Location: [facility] ENDOSCOPY;  Service: Gastroenterology;  Laterality: N/A;   COLONOSCOPY WITH PROPOFOL N/A 12/19/2020   Procedure: COLONOSCOPY WITH PROPOFOL;  Surgeon: [name], MD;  Location: [facility] ENDOSCOPY;  Service: Gastroenterology;  Laterality: N/A;   ESOPHAGOGASTRODUODENOSCOPY (EGD) WITH PROPOFOL N/A 05/07/2017   Procedure: ESOPHAGOGASTRODUODENOSCOPY (EGD) WITH PROPOFOL;  Surgeon: [name], MD;  Location: [facility] ENDOSCOPY;  Service: Gastroenterology;  Laterality: N/A;   FOOT SURGERY Bilateral    INCONTINENCE SURGERY  2009   INTERSTIM IMPLANT PLACEMENT     other     over active bladder   OTHER SURGICAL HISTORY     bladder stimulator    PARTIAL HYSTERECTOMY  03/1996   fibroids   PORTA CATH INSERTION N/A 03/10/2019   Procedure: PORTA CATH INSERTION;  Surgeon: Algernon Huxley, MD;  Location: [facility];  Service:  Cardiovascular;  Laterality: N/A;   TONSILLECTOMY  2007    Social History   Tobacco Use   Smoking status: Former    Packs/day: 1.00    Years: 20.00    Pack years: 20.00    Types: Cigarettes    Quit date: 07/02/1991    Years since quitting: 29.9   Smokeless tobacco: Never  Vaping Use   Vaping Use: Never used  Substance Use Topics   Alcohol use: Yes    Comment: occasionally   Drug use: No     Medication list has been reviewed and updated.  Current Meds  Medication Sig   acetaminophen (TYLENOL) 500 MG tablet Take 500 mg by mouth every 6 (six) hours as needed.   acyclovir (ZOVIRAX) 400 MG tablet Take 1 tablet (400 mg total) by mouth 2 (two) times daily.   albuterol (VENTOLIN HFA) 108 (90 Base) MCG/ACT inhaler Inhale 2 puffs into the lungs every 6 (six) hours as needed for wheezing or shortness of breath.   allopurinol (ZYLOPRIM) 100 MG tablet TAKE 1 TABLET(100 MG) BY MOUTH DAILY as needed   bisoprolol (ZEBETA) 5 MG tablet Take 5 mg by mouth daily.  diclofenac sodium (VOLTAREN) 1 % GEL Apply 2 g topically 4 (four) times daily.   FLUoxetine (PROZAC) 40 MG capsule TAKE 1 CAPSULE EVERY DAY   fluticasone (FLONASE) 50 MCG/ACT nasal spray Place 2 sprays into both nostrils daily.   glucose blood test strip    montelukast (SINGULAIR) 10 MG tablet Take 1 tablet (10 mg total) by mouth See admin instructions. Take 1 tab daily for 2 days after daratumumab treatments   Multiple Vitamin (MULTIVITAMIN) tablet Take 1 tablet by mouth daily.   Multiple Vitamins-Minerals (HAIR/SKIN/NAILS) TABS Take 1 tablet by mouth daily.   omeprazole (PRILOSEC) 40 MG capsule TAKE 1 CAPSULE (40 MG TOTAL) BY MOUTH IN THE MORNING AND AT BEDTIME.   pregabalin (LYRICA) 25 MG capsule Take 25 mg by mouth in the morning and at bedtime.   promethazine (PHENERGAN) 25 MG tablet TAKE 1 TABLET EVERY 6 HOURS AS NEEDED FOR NAUSEA OR VOMITING   sucralfate (CARAFATE) 1 g tablet Take 1 tablet (1 g total) by mouth 4 (four) times  daily -  with meals and at bedtime.   tiZANidine (ZANAFLEX) 2 MG tablet Take 1 tablet (2 mg total) by mouth every 6 (six) hours as needed for muscle spasms.   traZODone (DESYREL) 100 MG tablet TAKE 1 TABLET AT BEDTIME    PHQ 2/9 Scores 06/22/2021 05/28/2021 03/13/2021 12/05/2020  PHQ - 2 Score 2 2 2 1   PHQ- 9 Score 6 12 11 2     GAD 7 : Generalized Anxiety Score 06/22/2021 05/28/2021 03/13/2021 12/05/2020  Nervous, Anxious, on Edge 2 1 2 1   Control/stop worrying 1 0 1 1  Worry too much - different things 1 1 1 1   Trouble relaxing 2 1 2 1   Restless 2 1 2 1   Easily annoyed or irritable 0 1 0 1  Afraid - awful might happen 0 0 0 1  Total GAD 7 Score 8 5 8 7   Anxiety Difficulty Not difficult at all Not difficult at all - Not difficult at all    BP Readings from Last 3 Encounters:  06/22/21 (!) 142/80  06/21/21 140/73  06/14/21 126/66    Physical Exam Constitutional:      Appearance: Normal appearance.  HENT:     Right Ear: Tympanic membrane and ear canal normal.     Left Ear: Tympanic membrane and ear canal normal.     Nose:     Right Sinus: No maxillary sinus tenderness or frontal sinus tenderness.     Left Sinus: No maxillary sinus tenderness or frontal sinus tenderness.     Mouth/Throat:     Mouth: Mucous membranes are moist.     Dentition: Has dentures. Gum lesions (sutures along the anterior lower gums) present.  Cardiovascular:     Rate and Rhythm: Normal rate and regular rhythm.     Pulses: Normal pulses.     Heart sounds: No murmur heard. Pulmonary:     Effort: Pulmonary effort is normal.     Breath sounds: Normal breath sounds. No wheezing or rhonchi.  Musculoskeletal:     Cervical back: Normal range of motion.     Right lower leg: No edema.     Left lower leg: No edema.  Lymphadenopathy:     Cervical: No cervical adenopathy.  Neurological:     Mental Status: She is alert.    Wt Readings from Last 3 Encounters:  06/22/21 160 lb (72.6 kg)  06/06/21 159 lb 12.8  oz (72.5 kg)  05/28/21 162 lb 3.2  oz (73.6 kg)    BP (!) 142/80    Pulse 86    Ht 5' 2"  (1.575 m)    Wt 160 lb (72.6 kg)    SpO2 96%    BMI 29.26 kg/m   Assessment and Plan: 1. Viral URI with cough Continue Flonase and Zyrtec. Start taking Singulair every day. Push fluids.  Use cough syrup at night. Covid test negative - promethazine-dextromethorphan (PROMETHAZINE-DM) 6.25-15 MG/5ML syrup; Take 5 mLs by mouth 4 (four) times daily as needed for cough.  Dispense: 180 mL; Refill: 0  2. Pain, dental Will give a few Tramadol for pain control. - traMADol (ULTRAM) 50 MG tablet; Take 1 tablet (50 mg total) by mouth every 8 (eight) hours as needed for up to 5 days.  Dispense: 15 tablet; Refill: 0   Partially dictated using Editor, commissioning. Any errors are unintentional.  Halina Maidens, MD [facility]  06/22/2021

## 2021-06-23 ENCOUNTER — Emergency Department: Payer: Medicare HMO

## 2021-06-23 ENCOUNTER — Observation Stay
Admission: EM | Admit: 2021-06-23 | Discharge: 2021-06-24 | Disposition: A | Discharge status: Home / Self Care | Payer: Medicare HMO | Attending: Internal Medicine | Admitting: Internal Medicine

## 2021-06-23 ENCOUNTER — Encounter: Payer: Self-pay | Admitting: Emergency Medicine

## 2021-06-23 ENCOUNTER — Other Ambulatory Visit: Payer: Self-pay

## 2021-06-23 DIAGNOSIS — N3000 Acute cystitis without hematuria: Secondary | ICD-10-CM

## 2021-06-23 DIAGNOSIS — N183 Chronic kidney disease, stage 3 unspecified: Secondary | ICD-10-CM | POA: Insufficient documentation

## 2021-06-23 DIAGNOSIS — C9000 Multiple myeloma not having achieved remission: Secondary | ICD-10-CM | POA: Diagnosis not present

## 2021-06-23 DIAGNOSIS — I493 Ventricular premature depolarization: Secondary | ICD-10-CM

## 2021-06-23 DIAGNOSIS — G620 Drug-induced polyneuropathy: Secondary | ICD-10-CM | POA: Diagnosis not present

## 2021-06-23 DIAGNOSIS — Z79899 Other long term (current) drug therapy: Secondary | ICD-10-CM | POA: Diagnosis not present

## 2021-06-23 DIAGNOSIS — M48062 Spinal stenosis, lumbar region with neurogenic claudication: Secondary | ICD-10-CM | POA: Diagnosis not present

## 2021-06-23 DIAGNOSIS — I509 Heart failure, unspecified: Secondary | ICD-10-CM | POA: Diagnosis not present

## 2021-06-23 DIAGNOSIS — K760 Fatty (change of) liver, not elsewhere classified: Secondary | ICD-10-CM | POA: Diagnosis not present

## 2021-06-23 DIAGNOSIS — I2510 Atherosclerotic heart disease of native coronary artery without angina pectoris: Secondary | ICD-10-CM | POA: Diagnosis present

## 2021-06-23 DIAGNOSIS — R000 Tachycardia, unspecified: Secondary | ICD-10-CM | POA: Diagnosis not present

## 2021-06-23 DIAGNOSIS — D696 Thrombocytopenia, unspecified: Secondary | ICD-10-CM | POA: Diagnosis present

## 2021-06-23 DIAGNOSIS — E119 Type 2 diabetes mellitus without complications: Secondary | ICD-10-CM | POA: Insufficient documentation

## 2021-06-23 DIAGNOSIS — Z9484 Stem cells transplant status: Secondary | ICD-10-CM | POA: Diagnosis not present

## 2021-06-23 DIAGNOSIS — C9001 Multiple myeloma in remission: Secondary | ICD-10-CM | POA: Diagnosis present

## 2021-06-23 DIAGNOSIS — Z87891 Personal history of nicotine dependence: Secondary | ICD-10-CM | POA: Diagnosis not present

## 2021-06-23 DIAGNOSIS — J45909 Unspecified asthma, uncomplicated: Secondary | ICD-10-CM | POA: Diagnosis present

## 2021-06-23 DIAGNOSIS — Z20822 Contact with and (suspected) exposure to covid-19: Secondary | ICD-10-CM | POA: Diagnosis not present

## 2021-06-23 DIAGNOSIS — I350 Nonrheumatic aortic (valve) stenosis: Secondary | ICD-10-CM | POA: Diagnosis not present

## 2021-06-23 DIAGNOSIS — I130 Hypertensive heart and chronic kidney disease with heart failure and stage 1 through stage 4 chronic kidney disease, or unspecified chronic kidney disease: Secondary | ICD-10-CM | POA: Insufficient documentation

## 2021-06-23 DIAGNOSIS — A419 Sepsis, unspecified organism: Principal | ICD-10-CM

## 2021-06-23 DIAGNOSIS — R0602 Shortness of breath: Secondary | ICD-10-CM | POA: Diagnosis not present

## 2021-06-23 DIAGNOSIS — J069 Acute upper respiratory infection, unspecified: Secondary | ICD-10-CM

## 2021-06-23 DIAGNOSIS — E118 Type 2 diabetes mellitus with unspecified complications: Secondary | ICD-10-CM | POA: Diagnosis present

## 2021-06-23 DIAGNOSIS — K529 Noninfective gastroenteritis and colitis, unspecified: Secondary | ICD-10-CM

## 2021-06-23 DIAGNOSIS — K0500 Acute gingivitis, plaque induced: Secondary | ICD-10-CM

## 2021-06-23 DIAGNOSIS — N390 Urinary tract infection, site not specified: Secondary | ICD-10-CM

## 2021-06-23 LAB — RESP PANEL BY RT-PCR (FLU A&B, COVID) ARPGX2
Influenza A by PCR: NEGATIVE
Influenza B by PCR: NEGATIVE
SARS Coronavirus 2 by RT PCR: NEGATIVE

## 2021-06-23 LAB — URINALYSIS, ROUTINE W REFLEX MICROSCOPIC
Bilirubin Urine: NEGATIVE
Glucose, UA: NEGATIVE mg/dL
Ketones, ur: NEGATIVE mg/dL
Nitrite: NEGATIVE
Protein, ur: 100 mg/dL — AB
Specific Gravity, Urine: >1.03 — ABNORMAL HIGH (ref 1.005–1.030)
WBC, UA: >50 WBC/hpf — ABNORMAL HIGH (ref 0–5)
pH: 5.5 (ref 5.0–8.0)

## 2021-06-23 LAB — BASIC METABOLIC PANEL
Anion gap: 8 (ref 5–15)
BUN: 24 mg/dL — ABNORMAL HIGH (ref 8–23)
CO2: 20 mmol/L — ABNORMAL LOW (ref 22–32)
Calcium: 8.8 mg/dL — ABNORMAL LOW (ref 8.9–10.3)
Chloride: 107 mmol/L (ref 98–111)
Creatinine, Ser: 0.94 mg/dL (ref 0.44–1.00)
GFR, Estimated: >60 mL/min (ref >60)
Glucose, Bld: 198 mg/dL — ABNORMAL HIGH (ref 70–99)
Potassium: 3.8 mmol/L (ref 3.5–5.1)
Sodium: 135 mmol/L (ref 135–145)

## 2021-06-23 LAB — CBC
HCT: 35 % — ABNORMAL LOW (ref 36.0–46.0)
Hemoglobin: 11.5 g/dL — ABNORMAL LOW (ref 12.0–15.0)
MCH: 27.1 pg (ref 26.0–34.0)
MCHC: 32.9 g/dL (ref 30.0–36.0)
MCV: 82.4 fL (ref 80.0–100.0)
Platelets: 109 10*3/uL — ABNORMAL LOW (ref 150–400)
RBC: 4.25 MIL/uL (ref 3.87–5.11)
RDW: 13.6 % (ref 11.5–15.5)
WBC: 6.7 10*3/uL (ref 4.0–10.5)
nRBC: 0 % (ref 0.0–0.2)

## 2021-06-23 LAB — LACTIC ACID, PLASMA
Lactic Acid, Venous: 2.1 mmol/L (ref 0.5–1.9)
Lactic Acid, Venous: 1.6 mmol/L (ref 0.5–1.9)

## 2021-06-23 MED ORDER — LACTATED RINGERS IV SOLN: INTRAVENOUS | Status: DC

## 2021-06-23 MED ORDER — BISOPROLOL FUMARATE 5 MG PO TABS
5.0000 mg | ORAL_TABLET | Freq: Every day | ORAL | Status: DC
  Administered 2021-06-24: 10:00:00 5 mg via ORAL
  Filled 2021-06-23: qty 1

## 2021-06-23 MED ORDER — ACETAMINOPHEN 500 MG PO TABS
ORAL_TABLET | ORAL | Status: AC
  Administered 2021-06-23: 21:00:00 1000 mg via ORAL
  Filled 2021-06-23: qty 1

## 2021-06-23 MED ORDER — SODIUM CHLORIDE 0.9 % IV SOLN
1.0000 g | Freq: Once | INTRAVENOUS | Status: AC
  Administered 2021-06-24: 01:00:00 1 g via INTRAVENOUS
  Filled 2021-06-23: qty 10

## 2021-06-23 MED ORDER — PANTOPRAZOLE SODIUM 40 MG PO TBEC
40.0000 mg | DELAYED_RELEASE_TABLET | Freq: Every day | ORAL | Status: DC
  Administered 2021-06-24: 10:00:00 40 mg via ORAL
  Filled 2021-06-23: qty 1

## 2021-06-23 MED ORDER — TIZANIDINE HCL 2 MG PO TABS
2.0000 mg | ORAL_TABLET | Freq: Four times a day (QID) | ORAL | Status: DC | PRN
  Filled 2021-06-23: qty 1

## 2021-06-23 MED ORDER — ALBUTEROL SULFATE (2.5 MG/3ML) 0.083% IN NEBU: 2.5000 mg | INHALATION_SOLUTION | Freq: Four times a day (QID) | RESPIRATORY_TRACT | Status: DC | PRN

## 2021-06-23 MED ORDER — PROMETHAZINE-DM 6.25-15 MG/5ML PO SYRP: 5.0000 mL | ORAL_SOLUTION | Freq: Four times a day (QID) | ORAL | Status: DC | PRN

## 2021-06-23 MED ORDER — TRAZODONE HCL 50 MG PO TABS: 100.0000 mg | ORAL_TABLET | Freq: Every day | ORAL | Status: DC

## 2021-06-23 MED ORDER — FLUOXETINE HCL 20 MG PO CAPS
40.0000 mg | ORAL_CAPSULE | Freq: Every day | ORAL | Status: DC
  Administered 2021-06-24: 10:00:00 40 mg via ORAL
  Filled 2021-06-23: qty 2

## 2021-06-23 MED ORDER — LOPERAMIDE HCL 2 MG PO CAPS: 2.0000 mg | ORAL_CAPSULE | ORAL | Status: DC | PRN

## 2021-06-23 MED ORDER — INSULIN ASPART 100 UNIT/ML IJ SOLN
0.0000 [IU] | Freq: Three times a day (TID) | INTRAMUSCULAR | Status: DC
  Administered 2021-06-24: 08:00:00 2 [IU] via SUBCUTANEOUS
  Filled 2021-06-23: qty 1

## 2021-06-23 MED ORDER — CEFTRIAXONE SODIUM 1 G IJ SOLR
1.0000 g | Freq: Once | INTRAMUSCULAR | Status: AC
  Administered 2021-06-23: 22:00:00 1 g via INTRAMUSCULAR
  Filled 2021-06-23: qty 10

## 2021-06-23 MED ORDER — SODIUM CHLORIDE 0.9 % IV BOLUS (SEPSIS)
1000.0000 mL | Freq: Once | INTRAVENOUS | Status: AC
  Administered 2021-06-23: 23:00:00 1000 mL via INTRAVENOUS

## 2021-06-23 MED ORDER — INSULIN ASPART 100 UNIT/ML IJ SOLN: 0.0000 [IU] | Freq: Every day | INTRAMUSCULAR | Status: DC

## 2021-06-23 MED ORDER — PREGABALIN 25 MG PO CAPS
25.0000 mg | ORAL_CAPSULE | Freq: Two times a day (BID) | ORAL | Status: DC
  Administered 2021-06-24 (×2): 25 mg via ORAL
  Filled 2021-06-23 (×2): qty 1

## 2021-06-23 MED ORDER — LIDOCAINE HCL (PF) 1 % IJ SOLN
5.0000 mL | Freq: Once | INTRAMUSCULAR | Status: AC
  Administered 2021-06-23: 22:00:00 5 mL
  Filled 2021-06-23: qty 5

## 2021-06-23 MED ORDER — ENOXAPARIN SODIUM 40 MG/0.4ML IJ SOSY
40.0000 mg | PREFILLED_SYRINGE | INTRAMUSCULAR | Status: DC
  Administered 2021-06-24: 08:00:00 40 mg via SUBCUTANEOUS
  Filled 2021-06-23: qty 0.4

## 2021-06-23 MED ORDER — SODIUM CHLORIDE 0.9 % IV BOLUS (SEPSIS)
1000.0000 mL | Freq: Once | INTRAVENOUS | Status: AC
  Administered 2021-06-24: 1000 mL via INTRAVENOUS

## 2021-06-23 MED ORDER — SODIUM CHLORIDE 0.9 % IV SOLN: 500.0000 mg | INTRAVENOUS | Status: DC

## 2021-06-23 MED ORDER — ACETAMINOPHEN 500 MG PO TABS
1000.0000 mg | ORAL_TABLET | Freq: Once | ORAL | Status: AC
  Administered 2021-06-23: 21:00:00 1000 mg via ORAL
  Filled 2021-06-23: qty 2

## 2021-06-23 MED ORDER — TRAMADOL HCL 50 MG PO TABS: 50.0000 mg | ORAL_TABLET | Freq: Three times a day (TID) | ORAL | Status: DC | PRN

## 2021-06-23 MED ORDER — PROMETHAZINE-CODEINE 6.25-10 MG/5ML PO SYRP
5.0000 mL | ORAL_SOLUTION | Freq: Once | ORAL | Status: DC
  Filled 2021-06-23: qty 5

## 2021-06-23 MED ORDER — SODIUM CHLORIDE 0.9 % IV SOLN: 2.0000 g | INTRAVENOUS | Status: DC

## 2021-06-23 NOTE — ED Triage Notes (Signed)
Pt via POV from home. Pt c/o cough, SOB, nasal congestion, and fever for the past week. Pt states PCP tested her for COVID and it was negative. Pt has a hx of multiple myeloma. Pt is only taking oral chemo at this time. Pt is A&OX4 and NAD.

## 2021-06-23 NOTE — ED Provider Notes (Signed)
[facility] Emergency Department Provider Note ____________________________________________   Event Date/Time   First MD Initiated Contact with Patient 06/23/21 1927     (approximate)  I have reviewed the triage vital signs and the nursing notes.  HISTORY  Chief Complaint Shortness of Breath   HPI Sarah [name] is a 65 y.o. femalewho presents to the ED for evaluation of shortness of breath.  Chart review indicates patient was seen by PCP yesterday and diagnosed with a viral URI with cough.  When reviewing this note it sounds like she has had about a month of nonproductive coughing waxing and waning.  She was prescribed cough syrup.  Patient presents to the ED for the evaluation of continued congestion, nonproductive cough.  She reports low-grade fevers up to 101 F at home.  She reports hesitancy to take Tylenol due to hepatic steatosis and NSAIDs due to history of CKD.  She presents to the ED requesting medication "I just want to feel better."  She reports that she was unable to pick up the cough syrup from her PCP visit yesterday due to the power being out at the pharmacy due to severe weather.  Denies any chest pain, syncopal episodes, productive cough, emesis or diarrhea.  Denies abdominal pain.  Reports that she can only tolerate eating oatmeal.  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   Due to [place]   Cardiomyopathy, idiopathic ([place]) 08/21/2017   Overview:  Septal and apical hypokinesis with ef 40%   CHF (congestive heart failure) (HCC)    CKD (chronic kidney disease) stage 3, GFR 30-59 ml/min (HCC)    Depression    Diabetes mellitus ([place]    Dizziness    Fatty liver    Frequent falls    GERD (gastroesophageal reflux  disease)    Gout    Heart murmur    History of blood transfusion    History of bone marrow transplant ([place])    History of uterine fibroid    Hx of cardiac catheterization 06/05/2016   Overview:  Normal coronaries 2017   Hypertension    Hypomagnesemia    IDA (iron deficiency anemia)    Multiple myeloma ([place])    Personal history of chemotherapy    Renal cyst    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[place]) 06/05/2016   Overview:  Sp ablation 1999    Patient Active Problem List   Diagnosis Date Noted   Multiple myeloma in remission ([place]) 05/14/2021   Leg swelling 04/02/2021   Neuropathy 04/02/2021   Fever in adult 03/05/2021   PVC (premature ventricular contraction) 12/04/2020   Diarrhea 10/25/2020   Encounter for antineoplastic chemotherapy 08/31/2020   Coronary artery disease involving native coronary artery of native heart 04/26/2020   Yeast vaginitis 03/18/2020   Hypokalemia 03/11/2020   Dyspnea 03/02/2020   Physical deconditioning 02/07/2020   Impaired nutrition 02/07/2020   Encounter for antineoplastic immunotherapy 10/03/2019   Hypocalcemia 08/28/2019   Stage 3 chronic kidney disease ([place]) 05/12/2019   Chronic nausea 12/17/2018   Chemotherapy-induced neuropathy ([place]) 12/17/2018   Hyperlipidemia associated with type 2 diabetes mellitus ([place]) 11/25/2018   Anemia 10/21/2018   Hypomagnesemia 08/20/2018   Goals of care, counseling/discussion 08/20/2018   B12 deficiency 08/20/2018   Thrombocytopenia ([place])  02/09/2018   Benign essential HTN 08/21/2017   Carotid artery stenosis, asymptomatic, bilateral 08/06/2017   Thyroid nodule 08/06/2017   Iron deficiency anemia due to chronic blood loss 05/13/2017   Spinal stenosis of lumbar region with neurogenic claudication 04/09/2017   Long term prescription opiate use 09/07/2016   Chronic pain syndrome 07/08/2016   Pain medication agreement signed 07/08/2016   Spondylosis of lumbar region without myelopathy or radiculopathy 07/08/2016   Severe  aortic stenosis 06/05/2016   Environmental and seasonal allergies 04/19/2016   Insomnia 04/19/2016   Asthma 04/19/2016   Aortic regurgitation 04/19/2016   Type II diabetes mellitus with complication ([place]) 54/00/8676   Depression, major, single episode, in partial remission ([place]) 04/12/2016   Irritable bowel syndrome (IBS) 04/12/2016   Hepatic steatosis 04/12/2016   Multiple myeloma not having achieved remission ([place]) 04/02/2016   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[place]) 07/06/2015   Stem cell transplant candidate 05/16/2015    Past Surgical History:  Procedure Laterality Date   ABDOMINAL HYSTERECTOMY     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   CARDIAC ELECTROPHYSIOLOGY MAPPING AND ABLATION     CARPAL TUNNEL RELEASE Bilateral    CHOLECYSTECTOMY  2008   COLONOSCOPY WITH PROPOFOL N/A 05/07/2017   Procedure: COLONOSCOPY WITH PROPOFOL;  Surgeon: [name], MD;  Location: [facility] ENDOSCOPY;  Service: Gastroenterology;  Laterality: N/A;   COLONOSCOPY WITH PROPOFOL N/A 12/19/2020   Procedure: COLONOSCOPY WITH PROPOFOL;  Surgeon: [name], MD;  Location: [facility] ENDOSCOPY;  Service: Gastroenterology;  Laterality: N/A;   ESOPHAGOGASTRODUODENOSCOPY (EGD) WITH PROPOFOL N/A 05/07/2017   Procedure: ESOPHAGOGASTRODUODENOSCOPY (EGD) WITH PROPOFOL;  Surgeon: [name], MD;  Location: [facility] ENDOSCOPY;  Service: Gastroenterology;  Laterality: N/A;   FOOT SURGERY Bilateral    INCONTINENCE SURGERY  2009   INTERSTIM IMPLANT PLACEMENT     other     over active bladder   OTHER SURGICAL HISTORY     bladder stimulator    PARTIAL HYSTERECTOMY  03/1996   fibroids   PORTA CATH INSERTION N/A 03/10/2019   Procedure: PORTA CATH INSERTION;  Surgeon: Algernon Huxley, MD;  Location: [place] CV LAB;  Service: Cardiovascular;  Laterality: N/A;   TONSILLECTOMY  2007    Prior to Admission medications   Medication Sig Start Date End Date Taking? Authorizing Provider  acetaminophen (TYLENOL) 500 MG tablet Take  500 mg by mouth every 6 (six) hours as needed.    [provider]  acyclovir (ZOVIRAX) 400 MG tablet Take 1 tablet (400 mg total) by mouth 2 (two) times daily. 03/28/21   Earlie Server, MD  albuterol (VENTOLIN HFA) 108 (90 Base) MCG/ACT inhaler Inhale 2 puffs into the lungs every 6 (six) hours as needed for wheezing or shortness of breath. 04/16/21   Earlie Server, MD  allopurinol (ZYLOPRIM) 100 MG tablet TAKE 1 TABLET(100 MG) BY MOUTH DAILY as needed 12/02/18   Glean Hess, MD  bisoprolol (ZEBETA) 5 MG tablet Take 5 mg by mouth daily. 07/05/20   [provider]  diclofenac sodium (VOLTAREN) 1 % GEL Apply 2 g topically 4 (four) times daily. 01/25/19   Gertie Baron, NP  FLUoxetine (PROZAC) 40 MG capsule TAKE 1 CAPSULE EVERY DAY 03/26/21   Glean Hess, MD  fluticasone [facility]) 50 MCG/ACT nasal spray Place 2 sprays into both nostrils daily. 07/10/18   Glean Hess, MD  glucose blood test strip  09/04/15   [provider]  montelukast (SINGULAIR) 10 MG tablet Take 1 tablet (10 mg  total) by mouth See admin instructions. Take 1 tab daily for 2 days after daratumumab treatments 06/06/21   Earlie Server, MD  Multiple Vitamin (MULTIVITAMIN) tablet Take 1 tablet by mouth daily.    [provider]  Multiple Vitamins-Minerals (HAIR/SKIN/NAILS) TABS Take 1 tablet by mouth daily.    [provider]  omeprazole (PRILOSEC) 40 MG capsule TAKE 1 CAPSULE (40 MG TOTAL) BY MOUTH IN THE MORNING AND AT BEDTIME. 05/27/21   Glean Hess, MD  pregabalin (LYRICA) 25 MG capsule Take 25 mg by mouth in the morning and at bedtime. 05/03/21   [provider]  promethazine (PHENERGAN) 25 MG tablet TAKE 1 TABLET EVERY 6 HOURS AS NEEDED FOR NAUSEA OR VOMITING 06/05/21   Earlie Server, MD  promethazine-dextromethorphan (PROMETHAZINE-DM) 6.25-15 MG/5ML syrup Take 5 mLs by mouth 4 (four) times daily as needed for cough. 06/22/21   Glean Hess, MD  sucralfate (CARAFATE) 1 g tablet Take  1 tablet (1 g total) by mouth 4 (four) times daily -  with meals and at bedtime. 04/17/21 07/11/21  [name], MD  tiZANidine (ZANAFLEX) 2 MG tablet Take 1 tablet (2 mg total) by mouth every 6 (six) hours as needed for muscle spasms. 05/28/21   Glean Hess, MD  traMADol (ULTRAM) 50 MG tablet Take 1 tablet (50 mg total) by mouth every 8 (eight) hours as needed for up to 5 days. 06/22/21 06/27/21  Glean Hess, MD  traZODone (DESYREL) 100 MG tablet TAKE 1 TABLET AT BEDTIME 04/30/21   Glean Hess, MD    Allergies Oxycodone-acetaminophen, Celebrex [celecoxib], Codeine, Plerixafor, Benadryl [diphenhydramine], Morphine, Ondansetron, and Tylenol [acetaminophen]  Family History  Problem Relation Age of Onset   Colon cancer Father    Renal Disease Father    Diabetes Mellitus II Father    Melanoma Paternal Grandmother    Breast cancer Maternal Aunt 67   Anemia Mother    Heart disease Mother    Heart failure Mother    Renal Disease Mother    Congestive Heart Failure Mother    Heart disease Maternal Uncle    Throat cancer Maternal Uncle    Lung cancer Maternal Uncle    Liver disease Maternal Uncle    Heart failure Maternal Uncle    Hearing loss Son 50       Suicide     Social History Social History   Tobacco Use   Smoking status: Former    Packs/day: 1.00    Years: 20.00    Pack years: 20.00    Types: Cigarettes    Quit date: 07/02/1991    Years since quitting: 29.9   Smokeless tobacco: Never  Vaping Use   Vaping Use: Never used  Substance Use Topics   Alcohol use: Yes    Comment: occasionally   Drug use: No    Review of Systems  Constitutional: No fever/chills Eyes: No visual changes. ENT: No sore throat. Positive for respiratory congestion Cardiovascular: Denies chest pain. Respiratory: Denies shortness of breath.  Positive for nonproductive cough Gastrointestinal: No abdominal pain.  No nausea, no vomiting.  No diarrhea.  No  constipation. Genitourinary: Negative for dysuria. Musculoskeletal: Negative for back pain. Skin: Negative for rash. Neurological: Negative for headaches, focal weakness or numbness. ____________________________________________   PHYSICAL EXAM:  VITAL SIGNS: Vitals:   06/23/21 2150 06/23/21 2200  BP: 140/83 (!) 143/79  Pulse: 94 94  Resp: 16 13  Temp: (!) 101.1 F (38.4 C) (!) 101 F (38.3 C)  SpO2: 97% 97%     Constitutional: Alert and oriented. Well appearing and in no acute distress.  Pleasant and quite talkative.  Occasional nonproductive coughing during our conversation. Eyes: Conjunctivae are normal. PERRL. EOMI. Head: Atraumatic. Nose: No congestion/rhinnorhea. Mouth/Throat: Mucous membranes are moist.  Oropharynx non-erythematous.  Uvula is midline without erythema.  Normal voice. Neck: No stridor. No cervical spine tenderness to palpation. Cardiovascular: Tachycardic rate, regular rhythm. Grossly normal heart sounds.  Good peripheral circulation. Respiratory: Normal respiratory effort.  No retractions. Lungs CTAB. Gastrointestinal: Soft , nondistended, nontender to palpation. No CVA tenderness. Musculoskeletal: No lower extremity tenderness nor edema.  No joint effusions. No signs of acute trauma. Neurologic:  Normal speech and language. No gross focal neurologic deficits are appreciated. No gait instability noted. Skin:  Skin is warm, dry and intact. No rash noted. Psychiatric: Mood and affect are normal. Speech and behavior are normal. ____________________________________________   LABS (all labs ordered are listed, but only abnormal results are displayed)  Labs Reviewed  LACTIC ACID, PLASMA - Abnormal; Notable for the following components:      Result Value   Lactic Acid, Venous 2.1 (*)    All other components within normal limits  BASIC METABOLIC PANEL - Abnormal; Notable for the following components:   CO2 20 (*)    Glucose, Bld 198 (*)    BUN 24 (*)     Calcium 8.8 (*)    All other components within normal limits  CBC - Abnormal; Notable for the following components:   Hemoglobin 11.5 (*)    HCT 35.0 (*)    Platelets 109 (*)    All other components within normal limits  URINALYSIS, ROUTINE W REFLEX MICROSCOPIC - Abnormal; Notable for the following components:   Specific Gravity, Urine >1.030 (*)    Hgb urine dipstick TRACE (*)    Protein, ur 100 (*)    Leukocytes,Ua SMALL (*)    WBC, UA >50 (*)    Bacteria, UA RARE (*)    All other components within normal limits  RESP PANEL BY RT-PCR (FLU A&B, COVID) ARPGX2  URINE CULTURE  CULTURE, BLOOD (SINGLE)  LACTIC ACID, PLASMA   ____________________________________________  12 Lead EKG  Sinus tachycardia in a bigeminy pattern with frequent PVCs.  Normal axis and intervals otherwise.  No ischemic features. Second EKG from today demonstrates a similar sinus tachycardia, but with frequent PVCs not quite in a bigeminy pattern. ____________________________________________  RADIOLOGY  ED MD interpretation:  CXR reviewed by me without evidence of acute cardiopulmonary pathology.  Official radiology report(s): DG Chest 2 View  Result Date: 06/23/2021 CLINICAL DATA:  sob EXAM: CHEST - 2 VIEW COMPARISON:  April 02, 2021 March 17, 2020 FINDINGS: The cardiomediastinal silhouette is unchanged in contour.Calcified mediastinal lymph nodes. RIGHT chest port with tip terminating over the superior RIGHT atrium. No pleural effusion. No pneumothorax. No acute pleuroparenchymal abnormality. Status post cholecystectomy. Multilevel degenerative changes of the thoracic spine. IMPRESSION: No acute cardiopulmonary abnormality. Electronically Signed   By: Valentino Saxon M.D.   On: 06/23/2021 19:41    ____________________________________________   PROCEDURES and INTERVENTIONS  Procedure(s) performed (including Critical Care):  .1-3 Lead EKG Interpretation Performed by: Vladimir Crofts, MD Authorized  by: Vladimir Crofts, MD     Interpretation: abnormal     ECG rate:  102   ECG rate assessment: tachycardic     Rhythm: sinus tachycardia     Ectopy: bigeminy and PVCs     Conduction: normal   .Critical Care Performed  by: Vladimir Crofts, MD Authorized by: Vladimir Crofts, MD   Critical care provider statement:    Critical care time (minutes):  30   Critical care time was exclusive of:  Separately billable procedures and treating other patients   Critical care was necessary to treat or prevent imminent or life-threatening deterioration of the following conditions:  Sepsis   Critical care was time spent personally by me on the following activities:  Development of treatment plan with patient or surrogate, discussions with consultants, evaluation of patient's response to treatment, examination of patient, ordering and review of laboratory studies, ordering and review of radiographic studies, ordering and performing treatments and interventions, pulse oximetry, re-evaluation of patient's condition and review of old charts  Medications  promethazine-codeine (PHENERGAN with CODEINE) 6.25-10 MG/5ML syrup 5 mL (5 mLs Oral Not Given 06/23/21 2145)  lactated ringers infusion (has no administration in time range)  sodium chloride 0.9 % bolus 1,000 mL (has no administration in time range)    And  sodium chloride 0.9 % bolus 1,000 mL (has no administration in time range)  acetaminophen (TYLENOL) tablet 1,000 mg (1,000 mg Oral Given 06/23/21 2043)  cefTRIAXone (ROCEPHIN) injection 1 g (1 g Intramuscular Given 06/23/21 2148)  lidocaine (PF) (XYLOCAINE) 1 % injection 5 mL (5 mLs Infiltration Given 06/23/21 2149)    ____________________________________________   MDM / ED COURSE   65 year old female presents to the ED with chronic congestion and nonproductive cough with evidence of a viral syndrome.  She is tachycardic and has a low-grade fever, but looks clinically well.  No instability, signs of shock or  hypoxia.  CXR without infiltrate or PTX.  Blood work without evidence of sepsis or significant serum derangements.  Marginally elevated lactic acid is noted.  And her COVID and flu swabs are negative.  She looks great to me and I doubt bacterial etiology of her symptoms.  We will provide antipyretics and cough syrup per her request.  Oral fluids and urinalysis.   Clinical Course as of 06/23/21 2253  Sat Jun 23, 2021  2136 Reassessed and discussed urinalysis results.  She does relent and say that she has been voiding more frequently, but denies any dysuria.  We discussed adding antibiotics to treat this.  She reports feeling better after the Tylenol.  Due to holidays, we discussed inability to pick up oral antibiotics for couple days and so we can give her an IM dose of Rocephin until she can pick up these medications on Monday. [DS]  2252 Reassessed.  Still feeling poorly and still febrile.  Heart rate still borderline tachycardic.  Shared decision-making goals plan to admit to medicine due to signs of sepsis in the setting of her UTI.  I updated the nurse on plan of care. [DS]    Clinical Course User Index [DS] Vladimir Crofts, MD    ____________________________________________   FINAL CLINICAL IMPRESSION(S) / ED DIAGNOSES  Final diagnoses:  Sepsis without acute organ dysfunction, due to unspecified organism [facility])  Acute cystitis without hematuria     ED Discharge Orders     None        Tryson Lumley   Note:  This document was prepared using Dragon voice recognition software and may include unintentional dictation errors.    Vladimir Crofts, MD 06/23/21 [phone]

## 2021-06-23 NOTE — Sepsis Progress Note (Signed)
Monitoring for the code sepsis protocol. °

## 2021-06-23 NOTE — Progress Notes (Signed)
CODE SEPSIS - PHARMACY COMMUNICATION  **Broad Spectrum Antibiotics should be administered within 1 hour of Sepsis diagnosis**  Time Code Sepsis Called/Page Received:  12/24 @ 2250  Antibiotics Ordered: Ceftriaxone 1 gm   Time of 1st antibiotic administration: Ceftriaxone 1 gm IV X 1 on 12/24 @ 2148   Additional action taken by pharmacy:   If necessary, Name of Provider/Nurse Contacted:     Starlette Thurow D ,PharmD Clinical Pharmacist  06/23/2021  10:56 PM

## 2021-06-23 NOTE — H&P (Signed)
History and Physical    [name] SLP:[id number] DOB: 07/24/1955 DOA: 06/23/2021  PCP: Glean Hess, MD   Patient coming from: home  I have personally briefly reviewed patient's relevant medical records in [place]  Chief Complaint: Congestion and fever  HPI: Sarah [name] is a 65 y.o. 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 faecalis UTI, after presenting with fever, who presents to the ED with 2-day history of fever.  States she has had a persistent cough and congestion for the past month and had a negative flu test.  However about 4 days ago she had a dental procedure and 2 days ago developed a fever at first it was low-grade but peaked to about 103 at home.  She has associated pain in her gums.   Denies vomiting, abdominal pain or diarrhea and denies dysuria  ED course: T-max 101.1 with pulse of 112, BP 145/92 O2 sat 97% on room air Blood work: Normal WBC with lactic acid 2.1-1.6 Hemoglobin 11.5 and platelets 109 UA with small leukocyte esterase and rare bacteria COVID and flu negative  EKG, personally viewed and interpreted: Sinus tachycardia at 115 with ventricular bigeminy  Imaging: Chest x-ray with no acute cardiopulmonary abnormality  Patient started on Rocephin for possible UTI and given an IV fluid bolus per sepsis protocol Hospitalist consulted for admission.   Review of Systems: As per HPI otherwise all other systems on review of systems negative.   Assessment/Plan    Sepsis ([place])   Suspect dental infection   -Sepsis criteria includes fever, tachycardia and elevated lactic acid.  WBC normal but could be as patient is  immunosuppressed - Chest x-ray clear.  Urinalysis minimally abnormal - Follow cultures - IV fluid bolus - Additional treatment as follows below  Suspect dental infection/gingivitis - Unasyn - Orajel for dental pain - We will need follow-up with dentist  URI - Chest x-ray with no evidence of pneumonia, patient was hospitalized for pneumonia back in September - We will get procalcitonin - COVID and flu are negative - Antitussives, decongestants albuterol as needed  Possible urinary tract infection - History of a faecalis UTI in September - Follow cultures - Continue Rocephin    Frequent PVCs - Multiple PVCs on EKG - Followed by [facility] at [facility], last seen 12/2020 - Continue bisoprolol    Multiple myeloma not having achieved remission ([place])   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[place])   On maintenance chemotherapy - No acute issues - Follows with oncology    Type II diabetes mellitus with complication ([place]) - Sliding scale insulin coverage  Severe aortic stenosis, nonrheumatic - No acute issues -Close monitoring given sepsis fluids    Asthma - Not acutely exacerbated - Ventolin as needed    Spinal stenosis of lumbar region with neurogenic claudication   Severe aortic stenosis, nonrheumatic - Followed at cardiology [facility]    Thrombocytopenia [facility]), chronic - Platelet count 105,000, at baseline, probably related to chemotherapy    Chemotherapy-induced neuropathy (HCC) - Continue Lyrica  Chemotherapy-induced diarrhea - Continue loperamide  Chronic pain secondary to spinal stenosis - Continue tramadol  DVT prophylaxis: Lovenox  Code Status: full code  Family Communication:  none  Disposition Plan: Back to  previous home environment Consults called: none  Status:At the time of admission, it appears that the appropriate admission status for this patient is INPATIENT. This is judged to be reasonable and necessary in order to provide the  required intensity of service to ensure the patient's safety given the presenting symptoms, physical exam findings, and initial radiographic and laboratory data in the context of their  Comorbid conditions.   Patient requires inpatient status due to high intensity of service, high risk for further deterioration and high frequency of surveillance required.   I certify that at the point of admission it is my clinical judgment that the patient will require inpatient hospital care spanning beyond 2 midnights     Physical Exam: Vitals:   06/23/21 2045 06/23/21 2150 06/23/21 2200 06/23/21 2230  BP: (!) 147/57 140/83 (!) 143/79 139/73  Pulse: 100 94 94   Resp: 17 16 13 18   Temp:  (!) 101.1 F (38.4 C) (!) 101 F (38.3 C)   TempSrc:  Oral Oral   SpO2: 97% 97% 97%   Weight:      Height:       Constitutional: Alert, oriented x 3 . Not in any apparent distress HEENT:      Head: Normocephalic and atraumatic.         Eyes: PERLA, EOMI, Conjunctivae are normal. Sclera is non-icteric.       Mouth/Throat: Patient is a dentulous.  Shallow ulcers seen at site of prior lower incisors      Neck: Supple with no signs of meningismus. Cardiovascular: Regular rate and rhythm. No murmurs, gallops, or rubs. 2+ symmetrical distal pulses are present . No JVD. No  LE edema Respiratory: Respiratory effort normal .Lungs sounds clear bilaterally. No wheezes, crackles, or rhonchi.  Gastrointestinal: Soft, non tender, non distended. Positive bowel sounds.  Genitourinary: No CVA tenderness. Musculoskeletal: Nontender with normal range of motion in all extremities. No cyanosis, or erythema of extremities. Neurologic:  Face is symmetric. Moving all extremities. No gross focal neurologic deficits . Skin: Skin is warm, dry.  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   Due to Zometa   Cardiomyopathy, idiopathic ([place]) 08/21/2017   Overview:  Septal and apical hypokinesis with ef 40%   CHF (congestive heart failure) (HCC)    CKD (chronic kidney disease) stage 3, GFR 30-59 ml/min (HCC)    Depression    Diabetes mellitus (HCC)    Dizziness    Fatty liver    Frequent falls    GERD (gastroesophageal reflux disease)    Gout    Heart murmur    History of blood transfusion    History of bone marrow transplant ([place])    History of uterine fibroid    Hx of cardiac catheterization 06/05/2016   Overview:  Normal coronaries 2017   Hypertension    Hypomagnesemia    IDA (iron deficiency anemia)    Multiple myeloma ([place])    Personal history of chemotherapy    Renal cyst    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[place]) 06/05/2016   Overview:  Sp ablation 1999    Past Surgical History:  Procedure Laterality Date   ABDOMINAL HYSTERECTOMY  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   CARDIAC ELECTROPHYSIOLOGY MAPPING AND ABLATION     CARPAL TUNNEL RELEASE Bilateral    CHOLECYSTECTOMY  2008   COLONOSCOPY WITH PROPOFOL N/A 05/07/2017   Procedure: COLONOSCOPY WITH PROPOFOL;  Surgeon: [name], MD;  Location: [facility] ENDOSCOPY;  Service: Gastroenterology;  Laterality: N/A;   COLONOSCOPY WITH PROPOFOL N/A 12/19/2020   Procedure: COLONOSCOPY WITH PROPOFOL;  Surgeon: [name], MD;  Location: [facility] ENDOSCOPY;  Service: Gastroenterology;  Laterality: N/A;   ESOPHAGOGASTRODUODENOSCOPY (EGD) WITH PROPOFOL N/A 05/07/2017   Procedure: ESOPHAGOGASTRODUODENOSCOPY (EGD) WITH PROPOFOL;  Surgeon: [name], MD;  [facility] END[facility]COPY;  Service: Gastroenterology;  Laterality: N/A;   FOOT SURGERY Bilateral    INCONTINENCE SURGERY  2009   INTERSTIM IMPLANT PLACEMENT     other     over active bladder   OTHER SURGICAL HISTORY     bladder stimulator     PARTIAL HYSTERECTOMY  03/1996   fibroids   PORTA CATH INSERTION N/A 03/10/2019   Procedure: PORTA CATH INSERTION;  Surgeon: Algernon Huxley, MD;  Location: [place] CV LAB;  Service: Cardiovascular;  Laterality: N/A;   TONSILLECTOMY  2007     reports that she quit smoking about 29 years ago. Her smoking use included cigarettes. She has a 20.00 pack-year smoking history. She has never used smokeless tobacco. She reports current alcohol use. She reports that she does not use drugs.  Allergies  Allergen Reactions   Oxycodone-Acetaminophen Anaphylaxis    Swelling and rash   Celebrex [Celecoxib] Diarrhea   Codeine    Plerixafor     In 2016 during ASCT collection patient developed fever to 103.39F and required hospitalization   Benadryl [Diphenhydramine] Palpitations   Morphine Itching and Rash   Ondansetron Diarrhea   Tylenol [Acetaminophen] Itching and Rash    Family History  Problem Relation Age of Onset   Colon cancer Father    Renal Disease Father    Diabetes Mellitus II Father    Melanoma Paternal Grandmother    Breast cancer Maternal Aunt 15   Anemia Mother    Heart disease Mother    Heart failure Mother    Renal Disease Mother    Congestive Heart Failure Mother    Heart disease Maternal Uncle    Throat cancer Maternal Uncle    Lung cancer Maternal Uncle    Liver disease Maternal Uncle    Heart failure Maternal Uncle    Hearing loss Son 29       Suicide       Prior to Admission medications   Medication Sig Start Date End Date Taking? Authorizing Provider  acetaminophen (TYLENOL) 500 MG tablet Take 500 mg by mouth every 6 (six) hours as needed.    [provider]  acyclovir (ZOVIRAX) 400 MG tablet Take 1 tablet (400 mg total) by mouth 2 (two) times daily. 03/28/21   Earlie Server, MD  albuterol (VENTOLIN HFA) 108 (90 Base) MCG/ACT inhaler Inhale 2 puffs into the lungs every 6 (six) hours as needed for wheezing or shortness of breath. 04/16/21   Earlie Server, MD   allopurinol (ZYLOPRIM) 100 MG tablet TAKE 1 TABLET(100 MG) BY MOUTH DAILY as needed 12/02/18   Glean Hess, MD  bisoprolol (ZEBETA) 5 MG tablet Take 5 mg by mouth daily. 07/05/20   [provider]  diclofenac sodium (VOLTAREN) 1 % GEL Apply 2 g topically 4 (four) times daily. 01/25/19   Gertie Baron, NP  FLUoxetine (PROZAC) 40 MG capsule  TAKE 1 CAPSULE EVERY DAY 03/26/21   Glean Hess, MD  fluticasone [facility]) 50 MCG/ACT nasal spray Place 2 sprays into both nostrils daily. 07/10/18   Glean Hess, MD  glucose blood test strip  09/04/15   [provider]  montelukast (SINGULAIR) 10 MG tablet Take 1 tablet (10 mg total) by mouth See admin instructions. Take 1 tab daily for 2 days after daratumumab treatments 06/06/21   Earlie Server, MD  Multiple Vitamin (MULTIVITAMIN) tablet Take 1 tablet by mouth daily.    [provider]  Multiple Vitamins-Minerals (HAIR/SKIN/NAILS) TABS Take 1 tablet by mouth daily.    [provider]  omeprazole (PRILOSEC) 40 MG capsule TAKE 1 CAPSULE (40 MG TOTAL) BY MOUTH IN THE MORNING AND AT BEDTIME. 05/27/21   Glean Hess, MD  pregabalin (LYRICA) 25 MG capsule Take 25 mg by mouth in the morning and at bedtime. 05/03/21   [provider]  promethazine (PHENERGAN) 25 MG tablet TAKE 1 TABLET EVERY 6 HOURS AS NEEDED FOR NAUSEA OR VOMITING 06/05/21   Earlie Server, MD  promethazine-dextromethorphan (PROMETHAZINE-DM) 6.25-15 MG/5ML syrup Take 5 mLs by mouth 4 (four) times daily as needed for cough. 06/22/21   Glean Hess, MD  sucralfate (CARAFATE) 1 g tablet Take 1 tablet (1 g total) by mouth 4 (four) times daily -  with meals and at bedtime. 04/17/21 07/11/21  [name], MD  tiZANidine (ZANAFLEX) 2 MG tablet Take 1 tablet (2 mg total) by mouth every 6 (six) hours as needed for muscle spasms. 05/28/21   Glean Hess, MD  traMADol (ULTRAM) 50 MG tablet Take 1 tablet (50 mg total) by mouth every 8 (eight) hours as needed  for up to 5 days. 06/22/21 06/27/21  Glean Hess, MD  traZODone (DESYREL) 100 MG tablet TAKE 1 TABLET AT BEDTIME 04/30/21   Glean Hess, MD      Labs on Admission: I have personally reviewed following labs and imaging studies  CBC: Recent Labs  Lab 06/23/21 1901  WBC 6.7  HGB 11.5*  HCT 35.0*  MCV 82.4  PLT 017*   Basic Metabolic Panel: Recent Labs  Lab 06/21/21 1255 06/23/21 1901  NA  --  135  K  --  3.8  CL  --  107  CO2  --  20*  GLUCOSE  --  198*  BUN  --  24*  CREATININE  --  0.94  CALCIUM  --  8.8*  MG 1.2*  --    GFR: Estimated Creatinine Clearance: 56.4 mL/min (by C-G formula based on SCr of 0.94 mg/dL). Liver Function Tests: No results for input(s): AST, ALT, ALKPHOS, BILITOT, PROT, ALBUMIN in the last 168 hours. No results for input(s): LIPASE, AMYLASE in the last 168 hours. No results for input(s): AMMONIA in the last 168 hours. Coagulation Profile: No results for input(s): INR, PROTIME in the last 168 hours. Cardiac Enzymes: No results for input(s): CKTOTAL, CKMB, CKMBINDEX, TROPONINI in the last 168 hours. BNP (last 3 results) No results for input(s): PROBNP in the last 8760 hours. HbA1C: No results for input(s): HGBA1C in the last 72 hours. CBG: No results for input(s): GLUCAP in the last 168 hours. Lipid Profile: No results for input(s): CHOL, HDL, LDLCALC, TRIG, CHOLHDL, LDLDIRECT in the last 72 hours. Thyroid Function Tests: No results for input(s): TSH, T4TOTAL, FREET4, T3FREE, THYROIDAB in the last 72 hours. Anemia Panel: No results for input(s): VITAMINB12, FOLATE, FERRITIN, TIBC, IRON, RETICCTPCT in the last  72 hours. Urine analysis:    Component Value Date/Time   COLORURINE YELLOW 06/23/2021 2054   APPEARANCEUR CLEAR 06/23/2021 2054   APPEARANCEUR Clear 07/13/2018 0835   LABSPEC >1.030 (H) 06/23/2021 2054   PHURINE 5.5 06/23/2021 2054   GLUCOSEU NEGATIVE 06/23/2021 2054   HGBUR TRACE (A) 06/23/2021 2054   BILIRUBINUR  NEGATIVE 06/23/2021 2054   BILIRUBINUR neg 11/25/2018 1021   BILIRUBINUR Negative 07/13/2018 [place] 06/23/2021 2054   PROTEINUR 100 (A) 06/23/2021 2054   UROBILINOGEN 0.2 11/25/2018 1021   NITRITE NEGATIVE 06/23/2021 2054   LEUKOCYTESUR SMALL (A) 06/23/2021 2054    Radiological Exams on Admission: DG Chest 2 View  Result Date: 06/23/2021 CLINICAL DATA:  sob EXAM: CHEST - 2 VIEW COMPARISON:  April 02, 2021 March 17, 2020 FINDINGS: The cardiomediastinal silhouette is unchanged in contour.Calcified mediastinal lymph nodes. RIGHT chest port with tip terminating over the superior RIGHT atrium. No pleural effusion. No pneumothorax. No acute pleuroparenchymal abnormality. Status post cholecystectomy. Multilevel degenerative changes of the thoracic spine. IMPRESSION: No acute cardiopulmonary abnormality. Electronically Signed   By: Valentino Saxon M.D.   On: 06/23/2021 19:41       Athena Masse MD [facility]   06/23/2021, 11:16 PM

## 2021-06-23 NOTE — Sepsis Progress Note (Signed)
Antibiotics given before code sepsis ordered or blood cultures drawn.

## 2021-06-23 NOTE — Sepsis Progress Note (Signed)
Notified provider of need to order antibiotics.   

## 2021-06-24 DIAGNOSIS — K0500 Acute gingivitis, plaque induced: Secondary | ICD-10-CM

## 2021-06-24 DIAGNOSIS — A419 Sepsis, unspecified organism: Secondary | ICD-10-CM | POA: Diagnosis not present

## 2021-06-24 LAB — PROTIME-INR
INR: 1.1 (ref 0.8–1.2)
Prothrombin Time: 14.4 seconds (ref 11.4–15.2)

## 2021-06-24 LAB — CORTISOL-AM, BLOOD: Cortisol - AM: 11.4 ug/dL (ref 6.7–22.6)

## 2021-06-24 LAB — GLUCOSE, CAPILLARY
Glucose-Capillary: 148 mg/dL — ABNORMAL HIGH (ref 70–99)
Glucose-Capillary: 119 mg/dL — ABNORMAL HIGH (ref 70–99)

## 2021-06-24 LAB — PROCALCITONIN: Procalcitonin: <0.1 ng/mL

## 2021-06-24 MED ORDER — ACETAMINOPHEN 500 MG PO TABS
1000.0000 mg | ORAL_TABLET | Freq: Once | ORAL | Status: AC
  Administered 2021-06-24: 08:00:00 1000 mg via ORAL
  Filled 2021-06-24: qty 2

## 2021-06-24 MED ORDER — CHLORHEXIDINE GLUCONATE CLOTH 2 % EX PADS
6.0000 | MEDICATED_PAD | Freq: Every day | CUTANEOUS | Status: DC
  Administered 2021-06-24: 12:00:00 6 via TOPICAL

## 2021-06-24 MED ORDER — SODIUM CHLORIDE 0.9 % IV SOLN
3.0000 g | Freq: Four times a day (QID) | INTRAVENOUS | Status: DC
  Administered 2021-06-24 (×2): 3 g via INTRAVENOUS
  Filled 2021-06-24 (×8): qty 8

## 2021-06-24 MED ORDER — HEPARIN SOD (PORK) LOCK FLUSH 100 UNIT/ML IV SOLN
500.0000 [IU] | Freq: Once | INTRAVENOUS | Status: AC
  Administered 2021-06-24: 15:00:00 500 [IU] via INTRAVENOUS
  Filled 2021-06-24: qty 5

## 2021-06-24 MED ORDER — BENZOCAINE 10 % MT GEL
Freq: Four times a day (QID) | OROMUCOSAL | Status: DC | PRN
  Filled 2021-06-24: qty 9

## 2021-06-24 MED ORDER — AMOXICILLIN-POT CLAVULANATE 875-125 MG PO TABS: 1.0000 | ORAL_TABLET | Freq: Two times a day (BID) | ORAL | 0 refills | Status: AC

## 2021-06-24 MED ORDER — PROCHLORPERAZINE EDISYLATE 10 MG/2ML IJ SOLN
10.0000 mg | Freq: Four times a day (QID) | INTRAMUSCULAR | Status: DC | PRN
  Administered 2021-06-24: 04:00:00 10 mg via INTRAVENOUS
  Filled 2021-06-24 (×2): qty 2

## 2021-06-24 NOTE — TOC Transition Note (Signed)
Transition of Care [facility]) - CM/SW Discharge Note   Patient Details  Name: Sarah [name] MRN: [id number] Date of Birth: December 22, 1955  [facility] CM/SW Contact:  Izola Price, RN Phone Number: 06/24/2021, 12:15 PM   Clinical Narrative:  Patient is being discharged today. Code-44 explained and e-signed per patient verbal agreement. Printed hard copy to unit to be given to patient. Simmie Davies RN CM            Patient Goals and CMS Choice        Discharge Placement                       Discharge Plan and Services                                     Social Determinants of Health (SDOH) Interventions     Readmission Risk Interventions Readmission Risk Prevention Plan 03/06/2021 10/20/2019  Transportation Screening Complete Complete  PCP or Specialist Appt within 3-5 Days Complete Complete  HRI or [place] Complete Complete  Social Work Consult for [place] Planning/Counseling Complete -  Palliative Care Screening Not Applicable Complete  Medication Review (RN Care Manager) Referral to Pharmacy Referral to Pharmacy  Some recent data might be hidden

## 2021-06-24 NOTE — Discharge Summary (Signed)
Physician Discharge Summary  Sarah [name] PNT:[id number] DOB: 1955/10/20 DOA: 06/23/2021  PCP: Glean Hess, MD  Admit date: 06/23/2021 Discharge date: 06/24/2021  Admitted From: Home Disposition: Home  Recommendations for Outpatient Follow-up:  Follow up with PCP in 1-2 weeks Please obtain BMP/CBC in one week Please follow up on the following pending results: None  Home Health: No Equipment/Devices: None Discharge Condition: Stable CODE STATUS: Full Diet recommendation: Heart Healthy / Carb Modified   Brief/Interim Summary: Sarah [name] is a 65 y.o. 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 faecalis UTI, after presenting with fever, who presents to the ED with 2-day history of fever.  States she has had a persistent cough and congestion for the past month and had a negative flu test.  However about 4 days ago she had a dental procedure and 2 days ago developed a fever at first it was low-grade but peaked to about 103 at home.  She has associated pain in her gums.   Denies vomiting, abdominal pain or diarrhea and denies dysuria. She initially met sepsis criteria with fever, mild tachycardia and tachypnea.  Concern of dental infection, she was given IV fluid and started on Unasyn.  Mildly elevated lactic acid which quickly resolves.  No leukocytosis.  UA with small leukocyte esterase and rare bacteria.  COVID-19 PCR and flu was negative.  Chest x-ray without any acute abnormality.  Patient most likely having upper respiratory viral illness.  There was some concern of dental infection due to her history of recent dental procedure and some immunocompromise state.   She was discharged on symptom management and Augmentin to complete a 7-day course.  She will follow-up with her dentist for further recommendations.  Patient will continue rest of her home medications and follow-up with her providers for further recommendations.  Discharge Diagnoses:  Principal Problem:   Sepsis ([place]) Active Problems:   Multiple myeloma not having achieved remission ([place])   Type II diabetes mellitus with complication (HCC)   Hepatic steatosis   Asthma   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[place])   Spinal stenosis of lumbar region with neurogenic claudication   Severe aortic stenosis   Thrombocytopenia (HCC)   Chemotherapy-induced neuropathy (HCC)   UTI (urinary tract infection)   Coronary artery disease involving native coronary artery of native heart   Frequent PVCs   URI (upper respiratory infection)   Chronic diarrhea   Acute gingivitis   Discharge Instructions  Discharge Instructions     Diet - low sodium heart healthy   Complete by: As directed    Discharge instructions   Complete by: As directed    It was pleasure taking care of you. You most likely having some viral upper respiratory infections, COVID and flu was negative. Please keep yourself well-hydrated, continue using your Flonase and other medications. You can use over-the-counter Tylenol cold and cough for your symptom management. We also gave you 6 more days of antibiotics for the concern of dental infection.  Please follow-up with your dentist as soon as possible for further recommendations. You already received IV dose for today, you can start from tomorrow if your pharmacy is not open today.   Increase activity slowly   Complete by: As directed  Allergies as of 06/24/2021       Reactions   Oxycodone-acetaminophen Anaphylaxis   Swelling and rash   Celebrex [celecoxib] Diarrhea   Codeine    Plerixafor    In 2016 during ASCT collection patient developed fever to 103.32F and  required hospitalization   Benadryl [diphenhydramine] Palpitations   Morphine Itching, Rash   Ondansetron Diarrhea   Tylenol [acetaminophen] Itching, Rash        Medication List     TAKE these medications    acetaminophen 500 MG tablet Commonly known as: TYLENOL Take 500 mg by mouth every 6 (six) hours as needed.   acyclovir 400 MG tablet Commonly known as: ZOVIRAX Take 1 tablet (400 mg total) by mouth 2 (two) times daily.   albuterol 108 (90 Base) MCG/ACT inhaler Commonly known as: VENTOLIN HFA Inhale 2 puffs into the lungs every 6 (six) hours as needed for wheezing or shortness of breath.   allopurinol 100 MG tablet Commonly known as: ZYLOPRIM TAKE 1 TABLET(100 MG) BY MOUTH DAILY as needed   amoxicillin-clavulanate 875-125 MG tablet Commonly known as: AUGMENTIN Take 1 tablet by mouth 2 (two) times daily for 6 days.   bisoprolol 5 MG tablet Commonly known as: ZEBETA Take 5 mg by mouth daily.   diclofenac sodium 1 % Gel Commonly known as: VOLTAREN Apply 2 g topically 4 (four) times daily.   FLUoxetine 40 MG capsule Commonly known as: PROZAC TAKE 1 CAPSULE EVERY DAY   fluticasone 50 MCG/ACT nasal spray Commonly known as: FLONASE Place 2 sprays into both nostrils daily.   glucose blood test strip   Hair/Skin/Nails Tabs Take 1 tablet by mouth daily.   montelukast 10 MG tablet Commonly known as: Singulair Take 1 tablet (10 mg total) by mouth See admin instructions. Take 1 tab daily for 2 days after daratumumab treatments   multivitamin tablet Take 1 tablet by mouth daily.   omeprazole 40 MG capsule Commonly known as: PRILOSEC TAKE 1 CAPSULE (40 MG TOTAL) BY MOUTH IN THE MORNING AND AT BEDTIME.   pregabalin 25 MG capsule Commonly known as: LYRICA Take 25 mg by mouth in the morning and at bedtime.   promethazine 25 MG tablet Commonly known as: PHENERGAN TAKE 1 TABLET EVERY 6 HOURS AS NEEDED FOR NAUSEA OR VOMITING   promethazine-dextromethorphan  6.25-15 MG/5ML syrup Commonly known as: PROMETHAZINE-DM Take 5 mLs by mouth 4 (four) times daily as needed for cough.   sucralfate 1 g tablet Commonly known as: Carafate Take 1 tablet (1 g total) by mouth 4 (four) times daily -  with meals and at bedtime.   tiZANidine 2 MG tablet Commonly known as: ZANAFLEX Take 1 tablet (2 mg total) by mouth every 6 (six) hours as needed for muscle spasms.   traMADol 50 MG tablet Commonly known as: ULTRAM Take 1 tablet (50 mg total) by mouth every 8 (eight) hours as needed for up to 5 days.   traZODone 100 MG tablet Commonly known as: DESYREL TAKE 1 TABLET AT BEDTIME        Follow-up Information     Glean Hess, MD. Schedule an appointment as soon as possible for a visit in 1 week(s).   Specialty: Internal Medicine Contact information: [address] [place] [address] [phone]                Allergies  Allergen Reactions   Oxycodone-Acetaminophen Anaphylaxis    Swelling and rash   Celebrex [Celecoxib] Diarrhea   Codeine  Plerixafor     In 2016 during ASCT collection patient developed fever to 103.70F and required hospitalization   Benadryl [Diphenhydramine] Palpitations   Morphine Itching and Rash   Ondansetron Diarrhea   Tylenol [Acetaminophen] Itching and Rash    Consultations: None  Procedures/Studies: DG Chest 2 View  Result Date: 06/23/2021 CLINICAL DATA:  sob EXAM: CHEST - 2 VIEW COMPARISON:  April 02, 2021 March 17, 2020 FINDINGS: The cardiomediastinal silhouette is unchanged in contour.Calcified mediastinal lymph nodes. RIGHT chest port with tip terminating over the superior RIGHT atrium. No pleural effusion. No pneumothorax. No acute pleuroparenchymal abnormality. Status post cholecystectomy. Multilevel degenerative changes of the thoracic spine. IMPRESSION: No acute cardiopulmonary abnormality. Electronically Signed   By: Valentino Saxon M.D.   On: 06/23/2021 19:41     Subjective: Patient was seen and examined today.  Having some nasal congestion and having some muffled voice.  Seems like having upper respiratory viral illness with some pharyngitis.  Gum pain improving after starting the antibiotics.  She would like to go home on p.o. antibiotics and will follow-up with her dentist as an outpatient.  Discharge Exam: Vitals:   06/24/21 0734 06/24/21 1144  BP: (!) 149/91 113/87  Pulse: 100 83  Resp: 16 16  Temp: (!) 101.4 F (38.6 C) 98.9 F (37.2 C)  SpO2: 96% 97%   Vitals:   06/24/21 0120 06/24/21 0536 06/24/21 0734 06/24/21 1144  BP: (!) 150/122 140/84 (!) 149/91 113/87  Pulse: (!) 108 (!) 112 100 83  Resp: 18  16 16   Temp: 98.2 F (36.8 C) (!) 101 F (38.3 C) (!) 101.4 F (38.6 C) 98.9 F (37.2 C)  TempSrc: Oral Oral Oral Oral  SpO2: 97% 96% 96% 97%  Weight:      Height:        General: Pt is alert, awake, not in acute distress Cardiovascular: RRR, S1/S2 +, no rubs, no gallops Respiratory: CTA bilaterally, no wheezing, no rhonchi Abdominal: Soft, NT, ND, bowel sounds + Extremities: no edema, no cyanosis   The results of significant diagnostics from this hospitalization (including imaging, microbiology, ancillary and laboratory) are listed below for reference.    Microbiology: Recent Results (from the past 240 hour(s))  Resp Panel by RT-PCR (Flu A&B, Covid) Nasopharyngeal Swab     Status: None   Collection Time: 06/23/21  7:01 PM   Specimen: Nasopharyngeal Swab; Nasopharyngeal(NP) swabs in vial transport medium  Result Value Ref Range Status   SARS Coronavirus 2 by RT PCR NEGATIVE NEGATIVE Final    Comment: (NOTE) SARS-CoV-2 target nucleic acids are NOT DETECTED.  The SARS-CoV-2 RNA is generally detectable in upper respiratory specimens during the acute phase of infection. The lowest concentration of SARS-CoV-2 viral copies this assay can detect is 138 copies/mL. A negative result does not preclude SARS-Cov-2 infection and  should not be used as the sole basis for treatment or other patient management decisions. A negative result may occur with  improper specimen collection/handling, submission of specimen other than nasopharyngeal swab, presence of viral mutation(s) within the areas targeted by this assay, and inadequate number of viral copies(<138 copies/mL). A negative result must be combined with clinical observations, patient history, and epidemiological information. The expected result is Negative.  Fact Sheet for Patients:  EntrepreneurPulse.com.au  Fact Sheet for Healthcare Providers:  IncredibleEmployment.be  This test is no t yet approved or cleared by the [place] FDA and  has been authorized for detection and/or diagnosis of SARS-CoV-2 by FDA under an Emergency Use  Authorization (EUA). This EUA will remain  in effect (meaning this test can be used) for the duration of the COVID-19 declaration under Section 564(b)(1) of the Act, 21 U.S.C.section 360bbb-3(b)(1), unless the authorization is terminated  or revoked sooner.       Influenza A by PCR NEGATIVE NEGATIVE Final   Influenza B by PCR NEGATIVE NEGATIVE Final    Comment: (NOTE) The Xpert Xpress SARS-CoV-2/FLU/RSV plus assay is intended as an aid in the diagnosis of influenza from Nasopharyngeal swab specimens and should not be used as a sole basis for treatment. Nasal washings and aspirates are unacceptable for Xpert Xpress SARS-CoV-2/FLU/RSV testing.  Fact Sheet for Patients: EntrepreneurPulse.com.au  Fact Sheet for Healthcare Providers: IncredibleEmployment.be  This test is not yet approved or cleared by the [place] FDA and has been authorized for detection and/or diagnosis of SARS-CoV-2 by FDA under an Emergency Use Authorization (EUA). This EUA will remain in effect (meaning this test can be used) for the duration of the COVID-19 declaration  under Section 564(b)(1) of the Act, 21 U.S.C. section 360bbb-3(b)(1), unless the authorization is terminated or revoked.  Performed at [facility], [address] [place], [place] [address]   Culture, blood (single)     Status: None (Preliminary result)   Collection Time: 06/23/21 11:27 PM   Specimen: BLOOD  Result Value Ref Range Status   Specimen Description BLOOD RIGHT  Final   Special Requests   Final    BOTTLES DRAWN AEROBIC AND ANAEROBIC Blood Culture results may not be optimal due to an excessive volume of blood received in culture bottles   Culture   Final    NO GROWTH < 12 HOURS Performed at [facility], [address] [place], [place] [address]    Report Status PENDING  Incomplete     Labs: BNP (last 3 results) No results for input(s): BNP in the last 8760 hours. Basic Metabolic Panel: Recent Labs  Lab 06/21/21 1255 06/23/21 1901  NA  --  135  K  --  3.8  CL  --  107  CO2  --  20*  GLUCOSE  --  198*  BUN  --  24*  CREATININE  --  0.94  CALCIUM  --  8.8*  MG 1.2*  --    Liver Function Tests: No results for input(s): AST, ALT, ALKPHOS, BILITOT, PROT, ALBUMIN in the last 168 hours. No results for input(s): LIPASE, AMYLASE in the last 168 hours. No results for input(s): AMMONIA in the last 168 hours. CBC: Recent Labs  Lab 06/23/21 1901  WBC 6.7  HGB 11.5*  HCT 35.0*  MCV 82.4  PLT 109*   Cardiac Enzymes: No results for input(s): CKTOTAL, CKMB, CKMBINDEX, TROPONINI in the last 168 hours. BNP: Invalid input(s): POCBNP CBG: Recent Labs  Lab 06/24/21 0733 06/24/21 1146  GLUCAP 148* 119*   D-Dimer No results for input(s): DDIMER in the last 72 hours. Hgb A1c No results for input(s): HGBA1C in the last 72 hours. Lipid Profile No results for input(s): CHOL, HDL, LDLCALC, TRIG, CHOLHDL, LDLDIRECT in the last 72 hours. Thyroid function studies No results for input(s): TSH, T4TOTAL, T3FREE, THYROIDAB in the last 72  hours.  Invalid input(s): FREET3 Anemia work up No results for input(s): VITAMINB12, FOLATE, FERRITIN, TIBC, IRON, RETICCTPCT in the last 72 hours. Urinalysis    Component Value Date/Time   COLORURINE YELLOW 06/23/2021 2054   APPEARANCEUR CLEAR 06/23/2021 2054   APPEARANCEUR Clear 07/13/2018 0835   LABSPEC >1.030 (  H) 06/23/2021 2054   PHURINE 5.5 06/23/2021 2054   GLUCOSEU NEGATIVE 06/23/2021 2054   HGBUR TRACE (A) 06/23/2021 2054   BILIRUBINUR NEGATIVE 06/23/2021 2054   BILIRUBINUR neg 11/25/2018 1021   BILIRUBINUR Negative 07/13/2018 [place] 06/23/2021 2054   PROTEINUR 100 (A) 06/23/2021 2054   UROBILINOGEN 0.2 11/25/2018 1021   NITRITE NEGATIVE 06/23/2021 2054   LEUKOCYTESUR SMALL (A) 06/23/2021 2054   Sepsis Labs Invalid input(s): PROCALCITONIN,  WBC,  LACTICIDVEN Microbiology Recent Results (from the past 240 hour(s))  Resp Panel by RT-PCR (Flu A&B, Covid) Nasopharyngeal Swab     Status: None   Collection Time: 06/23/21  7:01 PM   Specimen: Nasopharyngeal Swab; Nasopharyngeal(NP) swabs in vial transport medium  Result Value Ref Range Status   SARS Coronavirus 2 by RT PCR NEGATIVE NEGATIVE Final    Comment: (NOTE) SARS-CoV-2 target nucleic acids are NOT DETECTED.  The SARS-CoV-2 RNA is generally detectable in upper respiratory specimens during the acute phase of infection. The lowest concentration of SARS-CoV-2 viral copies this assay can detect is 138 copies/mL. A negative result does not preclude SARS-Cov-2 infection and should not be used as the sole basis for treatment or other patient management decisions. A negative result may occur with  improper specimen collection/handling, submission of specimen other than nasopharyngeal swab, presence of viral mutation(s) within the areas targeted by this assay, and inadequate number of viral copies(<138 copies/mL). A negative result must be combined with clinical observations, patient history, and  epidemiological information. The expected result is Negative.  Fact Sheet for Patients:  EntrepreneurPulse.com.au  Fact Sheet for Healthcare Providers:  IncredibleEmployment.be  This test is no t yet approved or cleared by the [place] FDA and  has been authorized for detection and/or diagnosis of SARS-CoV-2 by FDA under an Emergency Use Authorization (EUA). This EUA will remain  in effect (meaning this test can be used) for the duration of the COVID-19 declaration under Section 564(b)(1) of the Act, 21 U.S.C.section 360bbb-3(b)(1), unless the authorization is terminated  or revoked sooner.       Influenza A by PCR NEGATIVE NEGATIVE Final   Influenza B by PCR NEGATIVE NEGATIVE Final    Comment: (NOTE) The Xpert Xpress SARS-CoV-2/FLU/RSV plus assay is intended as an aid in the diagnosis of influenza from Nasopharyngeal swab specimens and should not be used as a sole basis for treatment. Nasal washings and aspirates are unacceptable for Xpert Xpress SARS-CoV-2/FLU/RSV testing.  Fact Sheet for Patients: EntrepreneurPulse.com.au  Fact Sheet for Healthcare Providers: IncredibleEmployment.be  This test is not yet approved or cleared by the [place] FDA and has been authorized for detection and/or diagnosis of SARS-CoV-2 by FDA under an Emergency Use Authorization (EUA). This EUA will remain in effect (meaning this test can be used) for the duration of the COVID-19 declaration under Section 564(b)(1) of the Act, 21 U.S.C. section 360bbb-3(b)(1), unless the authorization is terminated or revoked.  Performed at [facility], [address] [place], [place] [address]   Culture, blood (single)     Status: None (Preliminary result)   Collection Time: 06/23/21 11:27 PM   Specimen: BLOOD  Result Value Ref Range Status   Specimen Description BLOOD RIGHT  Final   Special Requests   Final     BOTTLES DRAWN AEROBIC AND ANAEROBIC Blood Culture results may not be optimal due to an excessive volume of blood received in culture bottles   Culture   Final    NO GROWTH < 12  HOURS Performed at [facility], [address], [place], [place] [address]    Report Status PENDING  Incomplete    Time coordinating discharge: Over 30 minutes  SIGNED:  Lorella Nimrod, MD  [facility] 06/24/2021, 1:01 PM  If 7PM-7AM, please contact night-coverage [email or web address]  This record has been created using Systems analyst. Errors have been sought and corrected,but may not always be located. Such creation errors do not reflect on the standard of care.

## 2021-06-24 NOTE — Progress Notes (Signed)
Pharmacy Antibiotic Note  Sarah [name] is a 65 y.o. female admitted on 06/23/2021 with  dental infection .  Pharmacy has been consulted for Unsayn dosing.  Plan: Unasyn 3 gm IV Q6H ordered to start on 12/25 @ 0300.   Height: 5\' 2"  (157.5 cm) Weight: 72.6 kg (160 lb) IBW/kg (Calculated) : 50.1  Temp (24hrs), Avg:100 F (37.8 C), Min:98.2 F (36.8 C), Max:101.1 F (38.4 C)  Recent Labs  Lab 06/23/21 1901 06/23/21 2054  WBC 6.7  --   CREATININE 0.94  --   LATICACIDVEN 2.1* 1.6    Estimated Creatinine Clearance: 56.4 mL/min (by C-G formula based on SCr of 0.94 mg/dL).    Allergies  Allergen Reactions   Oxycodone-Acetaminophen Anaphylaxis    Swelling and rash   Celebrex [Celecoxib] Diarrhea   Codeine    Plerixafor     In 2016 during ASCT collection patient developed fever to 103.16F and required hospitalization   Benadryl [Diphenhydramine] Palpitations   Morphine Itching and Rash   Ondansetron Diarrhea   Tylenol [Acetaminophen] Itching and Rash    Antimicrobials this admission:   >>    >>   Dose adjustments this admission:   Microbiology results:  BCx:   UCx:    Sputum:    MRSA PCR:   Thank you for allowing pharmacy to be a part of this patients care.  Toluwani Yadav D 06/24/2021 2:04 AM

## 2021-06-24 NOTE — Care Management CC44 (Signed)
Condition Code 44 Documentation Completed  Patient Details  Name: Sarah [name] MRN: [id number] Date of Birth: 13-Apr-1956   Condition Code 44 given:  Yes Patient signature on Condition Code 44 notice:  Yes Documentation of 2 MD's agreement:  Yes Code 44 added to claim:  Yes    Izola Price, RN 06/24/2021, 12:14 PM

## 2021-06-25 ENCOUNTER — Encounter: Payer: Self-pay | Admitting: Internal Medicine

## 2021-06-25 ENCOUNTER — Other Ambulatory Visit: Payer: Self-pay | Admitting: Internal Medicine

## 2021-06-25 DIAGNOSIS — J069 Acute upper respiratory infection, unspecified: Secondary | ICD-10-CM

## 2021-06-25 LAB — URINE CULTURE: Culture: 20000 — AB

## 2021-06-25 MED ORDER — PROMETHAZINE-DM 6.25-15 MG/5ML PO SYRP: 5.0000 mL | ORAL_SOLUTION | Freq: Four times a day (QID) | ORAL | 0 refills | Status: DC | PRN

## 2021-06-26 ENCOUNTER — Telehealth: Payer: Self-pay

## 2021-06-26 NOTE — Telephone Encounter (Signed)
Transition Care Management Unsuccessful Follow-up Telephone Call  Date of discharge and from where:  06/24/21 [facility]  Attempts:  1st Attempt  Reason for unsuccessful TCM follow-up call:  Left voice message

## 2021-06-28 ENCOUNTER — Inpatient Hospital Stay: Payer: Medicare HMO

## 2021-06-28 ENCOUNTER — Other Ambulatory Visit: Payer: Medicare HMO

## 2021-06-28 ENCOUNTER — Other Ambulatory Visit: Payer: Self-pay

## 2021-06-28 DIAGNOSIS — R808 Other proteinuria: Secondary | ICD-10-CM | POA: Diagnosis not present

## 2021-06-28 DIAGNOSIS — E538 Deficiency of other specified B group vitamins: Secondary | ICD-10-CM | POA: Diagnosis not present

## 2021-06-28 DIAGNOSIS — G629 Polyneuropathy, unspecified: Secondary | ICD-10-CM | POA: Diagnosis not present

## 2021-06-28 DIAGNOSIS — D801 Nonfamilial hypogammaglobulinemia: Secondary | ICD-10-CM | POA: Diagnosis not present

## 2021-06-28 DIAGNOSIS — I10 Essential (primary) hypertension: Secondary | ICD-10-CM | POA: Diagnosis not present

## 2021-06-28 DIAGNOSIS — Z5112 Encounter for antineoplastic immunotherapy: Secondary | ICD-10-CM | POA: Diagnosis not present

## 2021-06-28 DIAGNOSIS — E119 Type 2 diabetes mellitus without complications: Secondary | ICD-10-CM | POA: Diagnosis not present

## 2021-06-28 DIAGNOSIS — C9000 Multiple myeloma not having achieved remission: Secondary | ICD-10-CM | POA: Diagnosis not present

## 2021-06-28 LAB — MAGNESIUM: Magnesium: 1.1 mg/dL — ABNORMAL LOW (ref 1.7–2.4)

## 2021-06-28 MED ORDER — SODIUM CHLORIDE 0.9 % IV SOLN: 6.0000 g | Freq: Once | INTRAVENOUS | Status: DC

## 2021-06-28 MED ORDER — SODIUM CHLORIDE 0.9 % IV SOLN
25.0000 mg | Freq: Once | INTRAVENOUS | Status: AC
  Administered 2021-06-28: 13:00:00 25 mg via INTRAVENOUS
  Filled 2021-06-28: qty 1

## 2021-06-28 MED ORDER — SODIUM CHLORIDE 0.9% FLUSH
10.0000 mL | Freq: Once | INTRAVENOUS | Status: AC
  Administered 2021-06-28: 13:00:00 10 mL via INTRAVENOUS
  Filled 2021-06-28: qty 10

## 2021-06-28 MED ORDER — SODIUM CHLORIDE 0.9 % IV SOLN
Freq: Once | INTRAVENOUS | Status: AC
  Filled 2021-06-28: qty 250

## 2021-06-28 MED ORDER — MAGNESIUM SULFATE 2 GM/50ML IV SOLN
2.0000 g | Freq: Once | INTRAVENOUS | Status: AC
  Administered 2021-06-28: 16:00:00 2 g via INTRAVENOUS
  Filled 2021-06-28: qty 50

## 2021-06-28 MED ORDER — HEPARIN SOD (PORK) LOCK FLUSH 100 UNIT/ML IV SOLN
500.0000 [IU] | Freq: Once | INTRAVENOUS | Status: DC
  Filled 2021-06-28: qty 5

## 2021-06-28 MED ORDER — MAGNESIUM SULFATE 4 GM/100ML IV SOLN
4.0000 g | Freq: Once | INTRAVENOUS | Status: AC
  Administered 2021-06-28: 13:00:00 4 g via INTRAVENOUS
  Filled 2021-06-28: qty 100

## 2021-06-28 MED ORDER — HEPARIN SOD (PORK) LOCK FLUSH 100 UNIT/ML IV SOLN
500.0000 [IU] | Freq: Once | INTRAVENOUS | Status: AC
  Administered 2021-06-28: 17:00:00 500 [IU] via INTRAVENOUS
  Filled 2021-06-28: qty 5

## 2021-06-28 NOTE — Patient Instructions (Signed)
Magnesium Sulfate Injection What is this medication? MAGNESIUM SULFATE (mag NEE zee um SUL fate) prevents and treats low levels of magnesium in your body. It may also be used to prevent and treat seizures during pregnancy in people with high blood pressure disorders, such as preeclampsia or eclampsia. Magnesium plays an important role in maintaining the health of your muscles and nervous system. This medicine may be used for other purposes; ask your health care provider or pharmacist if you have questions. What should I tell my care team before I take this medication? They need to know if you have any of these conditions: Heart disease History of irregular heart beat Kidney disease An unusual or allergic reaction to magnesium sulfate, medications, foods, dyes, or preservatives Pregnant or trying to get pregnant Breast-feeding How should I use this medication? This medication is for infusion into a vein. It is given in a hospital or clinic setting. Talk to your care team about the use of this medication in children. While this medication may be prescribed for selected conditions, precautions do apply. Overdosage: If you think you have taken too much of this medicine contact a poison control center or emergency room at once. NOTE: This medicine is only for you. Do not share this medicine with others. What if I miss a dose? This does not apply. What may interact with this medication? Certain medications for anxiety or sleep Certain medications for seizures like phenobarbital Digoxin Medications that relax muscles for surgery Narcotic medications for pain This list may not describe all possible interactions. Give your health care provider a list of all the medicines, herbs, non-prescription drugs, or dietary supplements you use. Also tell them if you smoke, drink alcohol, or use illegal drugs. Some items may interact with your medicine. What should I watch for while using this medication? Your  condition will be monitored carefully while you are receiving this medication. You may need blood work done while you are receiving this medication. What side effects may I notice from receiving this medication? Side effects that you should report to your care team as soon as possible: Allergic reactions--skin rash, itching, hives, swelling of the face, lips, tongue, or throat High magnesium level--confusion, drowsiness, facial flushing, redness, sweating, muscle weakness, fast or irregular heartbeat, trouble breathing Low blood pressure--dizziness, feeling faint or lightheaded, blurry vision Side effects that usually do not require medical attention (report to your care team if they continue or are bothersome): Headache Nausea This list may not describe all possible side effects. Call your doctor for medical advice about side effects. You may report side effects to FDA at 1-800-FDA-1088. Where should I keep my medication? This medication is given in a hospital or clinic and will not be stored at home. NOTE: This sheet is a summary. It may not cover all possible information. If you have questions about this medicine, talk to your doctor, pharmacist, or health care provider.  2022 Elsevier/Gold Standard (2020-08-31 00:00:00)  

## 2021-06-28 NOTE — Progress Notes (Signed)
Magnesium 1.1 today. Magnesium 6 gm IV over 3 hours.

## 2021-06-28 NOTE — Telephone Encounter (Signed)
Transition Care Management Follow-up Telephone Call Date of discharge and from where: 06/24/21 How have you been since you were released from the hospital? Pt states she is still congested and coughing  Any questions or concerns? No  Items Reviewed: Did the pt receive and understand the discharge instructions provided? Yes  Medications obtained and verified? Yes  Other? No  Any new allergies since your discharge? No  Dietary orders reviewed? Yes Do you have support at home? Yes   Home Care and Equipment/Supplies: Were home health services ordered? no  Were any new equipment or medical supplies ordered?  No   Functional Questionnaire: (I = Independent and D = Dependent) ADLs: I  Bathing/Dressing- I  Meal Prep- I  Eating- I  Maintaining continence- I  Transferring/Ambulation- I  Managing Meds- I  Follow up appointments reviewed:  PCP Hospital f/u appt confirmed? No  Pt declines hospital follow up with PCP at this time but aware to contact office needed. [facility] f/u appt confirmed? No   Are transportation arrangements needed? No  If their condition worsens, is the pt aware to call PCP or go to the Emergency Dept.? Yes Was the patient provided with contact information for the PCP's office or ED? Yes Was to pt encouraged to call back with questions or concerns? Yes

## 2021-06-29 LAB — CULTURE, BLOOD (SINGLE): Culture: NO GROWTH

## 2021-07-03 ENCOUNTER — Ambulatory Visit: Payer: Medicare HMO

## 2021-07-03 ENCOUNTER — Other Ambulatory Visit: Payer: Medicare HMO

## 2021-07-04 ENCOUNTER — Inpatient Hospital Stay: Payer: Medicare HMO | Admitting: Oncology

## 2021-07-04 ENCOUNTER — Inpatient Hospital Stay: Payer: Medicare HMO

## 2021-07-04 ENCOUNTER — Encounter: Payer: Self-pay | Admitting: Oncology

## 2021-07-04 ENCOUNTER — Other Ambulatory Visit: Payer: Self-pay

## 2021-07-04 ENCOUNTER — Inpatient Hospital Stay: Payer: Medicare HMO | Attending: Oncology

## 2021-07-04 VITALS — BP 113/99 | HR 75 | Temp 97.2°F | Resp 18 | Wt 155.8 lb

## 2021-07-04 DIAGNOSIS — Z8379 Family history of other diseases of the digestive system: Secondary | ICD-10-CM | POA: Insufficient documentation

## 2021-07-04 DIAGNOSIS — I10 Essential (primary) hypertension: Secondary | ICD-10-CM | POA: Insufficient documentation

## 2021-07-04 DIAGNOSIS — Z800 Family history of malignant neoplasm of digestive organs: Secondary | ICD-10-CM | POA: Diagnosis not present

## 2021-07-04 DIAGNOSIS — Z79899 Other long term (current) drug therapy: Secondary | ICD-10-CM | POA: Insufficient documentation

## 2021-07-04 DIAGNOSIS — E538 Deficiency of other specified B group vitamins: Secondary | ICD-10-CM

## 2021-07-04 DIAGNOSIS — C9001 Multiple myeloma in remission: Secondary | ICD-10-CM

## 2021-07-04 DIAGNOSIS — Z8249 Family history of ischemic heart disease and other diseases of the circulatory system: Secondary | ICD-10-CM | POA: Diagnosis not present

## 2021-07-04 DIAGNOSIS — M879 Osteonecrosis, unspecified: Secondary | ICD-10-CM | POA: Insufficient documentation

## 2021-07-04 DIAGNOSIS — Z87891 Personal history of nicotine dependence: Secondary | ICD-10-CM | POA: Insufficient documentation

## 2021-07-04 DIAGNOSIS — Z803 Family history of malignant neoplasm of breast: Secondary | ICD-10-CM | POA: Insufficient documentation

## 2021-07-04 DIAGNOSIS — Z832 Family history of diseases of the blood and blood-forming organs and certain disorders involving the immune mechanism: Secondary | ICD-10-CM | POA: Diagnosis not present

## 2021-07-04 DIAGNOSIS — M2550 Pain in unspecified joint: Secondary | ICD-10-CM | POA: Diagnosis not present

## 2021-07-04 DIAGNOSIS — R197 Diarrhea, unspecified: Secondary | ICD-10-CM | POA: Insufficient documentation

## 2021-07-04 DIAGNOSIS — Z9484 Stem cells transplant status: Secondary | ICD-10-CM | POA: Diagnosis not present

## 2021-07-04 DIAGNOSIS — D649 Anemia, unspecified: Secondary | ICD-10-CM

## 2021-07-04 DIAGNOSIS — C9000 Multiple myeloma not having achieved remission: Secondary | ICD-10-CM | POA: Diagnosis not present

## 2021-07-04 DIAGNOSIS — G620 Drug-induced polyneuropathy: Secondary | ICD-10-CM | POA: Diagnosis not present

## 2021-07-04 DIAGNOSIS — Z5111 Encounter for antineoplastic chemotherapy: Secondary | ICD-10-CM

## 2021-07-04 DIAGNOSIS — Z818 Family history of other mental and behavioral disorders: Secondary | ICD-10-CM | POA: Diagnosis not present

## 2021-07-04 DIAGNOSIS — Z833 Family history of diabetes mellitus: Secondary | ICD-10-CM | POA: Diagnosis not present

## 2021-07-04 DIAGNOSIS — Z841 Family history of disorders of kidney and ureter: Secondary | ICD-10-CM | POA: Insufficient documentation

## 2021-07-04 DIAGNOSIS — Z801 Family history of malignant neoplasm of trachea, bronchus and lung: Secondary | ICD-10-CM | POA: Insufficient documentation

## 2021-07-04 DIAGNOSIS — R1310 Dysphagia, unspecified: Secondary | ICD-10-CM | POA: Diagnosis not present

## 2021-07-04 DIAGNOSIS — R110 Nausea: Secondary | ICD-10-CM | POA: Insufficient documentation

## 2021-07-04 DIAGNOSIS — M549 Dorsalgia, unspecified: Secondary | ICD-10-CM | POA: Insufficient documentation

## 2021-07-04 DIAGNOSIS — Z5112 Encounter for antineoplastic immunotherapy: Secondary | ICD-10-CM | POA: Diagnosis not present

## 2021-07-04 DIAGNOSIS — Z822 Family history of deafness and hearing loss: Secondary | ICD-10-CM | POA: Insufficient documentation

## 2021-07-04 DIAGNOSIS — T451X5A Adverse effect of antineoplastic and immunosuppressive drugs, initial encounter: Secondary | ICD-10-CM

## 2021-07-04 DIAGNOSIS — E119 Type 2 diabetes mellitus without complications: Secondary | ICD-10-CM | POA: Diagnosis not present

## 2021-07-04 LAB — COMPREHENSIVE METABOLIC PANEL
ALT: 56 U/L — ABNORMAL HIGH (ref 0–44)
AST: 51 U/L — ABNORMAL HIGH (ref 15–41)
Albumin: 4.3 g/dL (ref 3.5–5.0)
Alkaline Phosphatase: 96 U/L (ref 38–126)
Anion gap: 10 (ref 5–15)
BUN: 22 mg/dL (ref 8–23)
CO2: 23 mmol/L (ref 22–32)
Calcium: 9.1 mg/dL (ref 8.9–10.3)
Chloride: 102 mmol/L (ref 98–111)
Creatinine, Ser: 1.21 mg/dL — ABNORMAL HIGH (ref 0.44–1.00)
GFR, Estimated: 50 mL/min — ABNORMAL LOW (ref >60)
Glucose, Bld: 158 mg/dL — ABNORMAL HIGH (ref 70–99)
Potassium: 4.1 mmol/L (ref 3.5–5.1)
Sodium: 135 mmol/L (ref 135–145)
Total Bilirubin: 0.2 mg/dL — ABNORMAL LOW (ref 0.3–1.2)
Total Protein: 7 g/dL (ref 6.5–8.1)

## 2021-07-04 LAB — MAGNESIUM: Magnesium: 1 mg/dL — ABNORMAL LOW (ref 1.7–2.4)

## 2021-07-04 LAB — CBC WITH DIFFERENTIAL/PLATELET
Abs Immature Granulocytes: 0.02 10*3/uL (ref 0.00–0.07)
Basophils Absolute: 0 10*3/uL (ref 0.0–0.1)
Basophils Relative: 1 %
Eosinophils Absolute: 0.1 10*3/uL (ref 0.0–0.5)
Eosinophils Relative: 2 %
HCT: 35.3 % — ABNORMAL LOW (ref 36.0–46.0)
Hemoglobin: 11.9 g/dL — ABNORMAL LOW (ref 12.0–15.0)
Immature Granulocytes: 0 %
Lymphocytes Relative: 25 %
Lymphs Abs: 1.3 10*3/uL (ref 0.7–4.0)
MCH: 27.3 pg (ref 26.0–34.0)
MCHC: 33.7 g/dL (ref 30.0–36.0)
MCV: 81 fL (ref 80.0–100.0)
Monocytes Absolute: 0.5 10*3/uL (ref 0.1–1.0)
Monocytes Relative: 9 %
Neutro Abs: 3.3 10*3/uL (ref 1.7–7.7)
Neutrophils Relative %: 63 %
Platelets: 114 10*3/uL — ABNORMAL LOW (ref 150–400)
RBC: 4.36 MIL/uL (ref 3.87–5.11)
RDW: 13.7 % (ref 11.5–15.5)
WBC: 5.3 10*3/uL (ref 4.0–10.5)
nRBC: 0 % (ref 0.0–0.2)

## 2021-07-04 MED ORDER — HEPARIN SOD (PORK) LOCK FLUSH 100 UNIT/ML IV SOLN
INTRAVENOUS | Status: AC
  Administered 2021-07-04: 500 [IU]
  Filled 2021-07-04: qty 5

## 2021-07-04 MED ORDER — SODIUM CHLORIDE 0.9 % IV SOLN
25.0000 mg | Freq: Once | INTRAVENOUS | Status: AC
  Administered 2021-07-04: 25 mg via INTRAVENOUS
  Filled 2021-07-04: qty 1

## 2021-07-04 MED ORDER — MAGNESIUM SULFATE 4 GM/100ML IV SOLN
4.0000 g | Freq: Once | INTRAVENOUS | Status: AC
  Administered 2021-07-04: 4 g via INTRAVENOUS
  Filled 2021-07-04: qty 100

## 2021-07-04 MED ORDER — DARATUMUMAB-HYALURONIDASE-FIHJ 1800-30000 MG-UT/15ML ~~LOC~~ SOLN
1800.0000 mg | Freq: Once | SUBCUTANEOUS | Status: AC
  Administered 2021-07-04: 1800 mg via SUBCUTANEOUS
  Filled 2021-07-04: qty 15

## 2021-07-04 MED ORDER — SODIUM CHLORIDE 0.9 % IV SOLN
Freq: Once | INTRAVENOUS | Status: AC
  Filled 2021-07-04: qty 250

## 2021-07-04 MED ORDER — DEXAMETHASONE 4 MG PO TABS
20.0000 mg | ORAL_TABLET | Freq: Once | ORAL | Status: AC
  Administered 2021-07-04: 20 mg via ORAL
  Filled 2021-07-04: qty 5

## 2021-07-04 MED ORDER — MAGNESIUM SULFATE 2 GM/50ML IV SOLN
2.0000 g | Freq: Once | INTRAVENOUS | Status: AC
  Administered 2021-07-04: 2 g via INTRAVENOUS
  Filled 2021-07-04: qty 50

## 2021-07-04 MED ORDER — MONTELUKAST SODIUM 10 MG PO TABS: 10.0000 mg | ORAL_TABLET | Freq: Once | ORAL | Status: DC

## 2021-07-04 MED ORDER — DIPHENHYDRAMINE HCL 25 MG PO CAPS
25.0000 mg | ORAL_CAPSULE | Freq: Once | ORAL | Status: AC
  Administered 2021-07-04: 25 mg via ORAL
  Filled 2021-07-04: qty 1

## 2021-07-04 MED ORDER — SODIUM CHLORIDE 0.9 % IV SOLN: 6.0000 g | Freq: Once | INTRAVENOUS | Status: DC

## 2021-07-04 NOTE — Progress Notes (Addendum)
Hematology/Oncology [facility] note  [phone] [phone]     Clinic Day:  07/04/2021   Referring physician: Glean Hess, MD  Chief Complaint: Sarah [name] is a 66 y.o. 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   Initial bone marrow revealed 80% plasma cells.  Lambda free light chains were 1340.  She had nephrotic range proteinuria.  She initially underwent induction with RVD.  Revlimid maintenance was discontinued on 01/21/2017 secondary to intolerance.     07/19/2019 Bone marrow aspirate and biopsy on  revealed a normocellular marrow with but increased lambda-restricted plasma cells (9% aspirate, 40% CD138 immunohistochemistry).  Findings were consistent with recurrent plasma cell myeloma.  Flow cytometry revealed no monoclonal B-cell or phenotypically aberrant T-cell population. Cytogenetics were 50, XX (normal).  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    Lambda light chains have been followed: 22.2 (ratio 0.56) on 07/03/2017, 30.8 (ratio 0.78) on 09/02/2017, 36.9 (ratio 0.40) on 10/21/2017, 37.4 (ratio 0.41) on 12/16/2017, 70.7(ratio 0.31)  on 02/17/2018, 64.2 (ratio 0.27) on 04/07/2018, 78.9 (ratio 0.18) on 05/26/2018, 128.8 (ratio 0.17) on 08/06/2018, 181.5 (ratio 0.13) on 10/08/2018, 130.9 (ratio 0.13) on 10/20/2018, 160.7 (ratio 0.10)  on 12/09/2018, 236.6 (ratio 0.07) on 02/01/2019, 363.6 (ratio 0.04) on 03/22/2019, 404.8 (ratio 0.04) on 04/05/2019, 420.7 (ratio 0.03) on 05/24/2019, 573.4 (ratio 0.03) on 06/23/2019, 451.05 (ratio 0.02) on 08/20/2019, 47.2 (ratio 0.13) on 10/25/2019, 22.4 (ratio 0.21) on 11/25/2019, 16.5 (ratio 0.33) on 01/10/2020, 14.6 (ratio 0.34) on 02/07/2020, 13.1 (ratio 0.31) on 03/07/2020, 10.1 (ratio 0.38) on 04/10/2020, and 9.5 (ratio 0.21) on 06/19/2020.   24 hour UPEP on 06/03/2019 revealed kappa free light chains 95.76, lambda free light chains 1,260.71, and ratio 0.08.  24 hour UPEP on 08/23/2019 revealed total protein of 782 mg/24 hrs with lambda free light chains 1,084.16 mg/L and ratio of 0.10 (1.03-31.76).  M spike in urine was 46.1% (361 mg/24 hrs).    Bone survey on 04/08/2016 and 05/28/2017 revealed no definite lytic lesion seen in the visualized skeleton.  Bone survey on 11/19/2018 revealed no suspicious lucent lesions and no acute bony abnormality.  PET scan on 07/12/2019 revealed no focal metabolic activity to suggest active myeloma within the skeleton. There were no lytic lesions identified on the CT portion of the exam or soft tissue plasmacytomas. There was no evidence of multiple myeloma.    Pretreatment RBC phenotype on 09/23/2019 was positive for C, e, DUFFY B, KIDD B, M, S, and s antigen; negative for c, E, KELL, DUFFY A, KIDD A, and N antigen.    09/27/2019 - 10/25/2019; 12/09/2019 - 03/13/2020 6 cycles of daratumumab and hyaluronidase-fihj, Pomalyst, and Decadron (DPd) .  Cycle #1 was complicated by fever and neutropenia requiring admission.  Cycle #2 was complicated by pneumonia requiring admission.  Cycle #6 was complicated with an ER evaluation for an elevated lactic acid.   05/10/2020 second autologous stem cell transplant at [facility]  She underwent conditioning with melphalan 140 mg/m2.    She is s/p week #3 Velcade maintenance (began 08/31/2020 - 09/28/2020).  She has no increase in  neuropathy.  She has severe aortic stenosis.  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 Cardiology is following for a possible TAVR in the future.  Echo on 07/05/2020 revealed moderate to severe aortic stenosis with an EF of 50-55%.  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 Zometa was discontinued in 01/2017.  She has chronic nausea on Phenergan.     B12 deficiency.  B12 was 254 on 04/09/2017, 295 on 08/20/2018, and 391 on 10/08/2018.  She was on oral B12.  She received B12 monthly (last 09/14/2020).  Folate was 12.1 on 01/10/2020.    She has iron deficiency.  Ferritin was 32 on 07/01/2019.  She received Venofer on 07/15/2019 and 07/22/2019.   She has hypogammaglobulinemia.  IgG was 245 on 11/25/2019.  She received monthly IVIG (07/22/021 - 03/22/2020).  She received IVIG 400 mg/kg on 01/20/2020, 200 mg/kg on 02/17/2020, and 300 mg/kg on 03/22/2020.  IVIG on 16/04/9603 was complicated by acute renal failure. IgG trough level was 418 on 02/17/2020.  10/15/2019 - 10/20/2019 She was admitted to [facility] with fever and neutropenia.  Cultures were negative.  CXR was negative. She received broad spectrum antibiotics and daily Granix.  She received IVF for acute renal insufficiency due to diarrhea and dehydration.  Creatine was 1.66 on admission and 1.12 on discharge.    03/01/2020 - 03/05/2020 admitted to [facility] from with fever and neutropenia. CXR revealed no active cardiopulmonary disease. Chest CT with contrast revealed no acute intrathoracic pathology. There were findings which could be suggestive of prior granulomatous disease. She was treated with Cefepime and Vancomycin, then switched to ciprofloxacin on 03/03/2020.   05/08/2020 - 12/05/2021The patient was admitted to [facility] from  for autologous bone marrow transplant.   She received conditioning with melphalan 140 mg/m2.  Bone marrow transplant was on  05/10/2020. The patient developed fevers daily from 05/19/2020 - 05/24/2020. Blood cultures were + for strept sanguis bacteremia.  She was treated cefepime, vancomycin, and solumedrol for possible engraftment syndrome. Cefepime was switched to ceftriaxone; she completed 2 weeks of ceftriaxone on 06/03/2020. She had chemotherapy induced diarrhea.  She experienced urinary retention.  Feb 2022 patient has been on maintenance Velcade 2 mg every 2 weeks.  Chronic diarrhea seen by gastroenterology Dr. Vicente Males and was recommended to avoid artificial sugars in her diet including Diet Coke and coffee mate.  She feels some improvement of her diarrhea frequency since the dietary modification.  GI profile PCR negative, C. difficile toxin A+ B negative, fecal calprotectin 77 12/19/2020 colonoscopy showed 2 subcentimeter polyps in the ascending colon, resected and removed.  Pathology showed tubular adenoma.  No malignancy.  Normal mucosa in the entire examined colon.  Biopsied, pathology showed colonic mucosa with no significant pathological alteration.  Negative for active inflammation and features of chronicity.  Negative for microscopic colitis, dysplasia, and malignanc  Diabetes, patient has discontinued metformin due to diarrhea and started on glipizide 2.5 mg   03/05/2021-03/09/2021 Patient was hospitalized due to fever and chills, nonproductive cough, shortness of breath. X-ray showed right lower lobe atelectasis versus pneumonia.  Blood cultures negative.  Urine culture showed 60,000 colonies of Enterococcus facialis.  Patient received antibiotics. Patient also had abdominal pelvis CT scan for work-up of intermittent lower abdomen pain.No  acute abnormality.   05/14/21 last dose of Velcade maintenance.  establish care with neurology Dr. Gurney Maxin and her neuropathy regimen has been switched from gabapentin to Lyrica. 05/29/2021 patient got influenza   INTERVAL HISTORY Sarah [name] is a 66 y.o. female  who has above history reviewed by me today presents for follow up visit for management of multiple myeloma.  Patient has switched to daratumumab injection.  Tolerates well. She still has some lingering cough from previous influenza.  No fever, sputum production, vomiting diarrhea.  Chronic nausea, unchanged.  She has noticed the chronic diarrhea has slightly gotten better.  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   Due to Zometa   Cardiomyopathy, idiopathic ([place]) 08/21/2017   Overview:  Septal and apical hypokinesis with ef 40%   CHF (congestive heart failure) (HCC)    CKD (chronic kidney disease) stage 3, GFR 30-59 ml/min (HCC)    Depression    Diabetes mellitus (HCC)    Dizziness    Fatty liver    Frequent falls    GERD (gastroesophageal reflux disease)    Gout    Heart murmur    History of blood transfusion    History of bone marrow transplant ([place])    History of uterine fibroid    Hx of cardiac catheterization 06/05/2016   Overview:  Normal coronaries 2017   Hypertension    Hypomagnesemia    IDA (iron deficiency anemia)    Multiple myeloma ([place])    Personal history of chemotherapy    Renal cyst    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[place]) 06/05/2016   Overview:  Sp ablation 1999    Past Surgical History:  Procedure Laterality Date   ABDOMINAL HYSTERECTOMY     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   CARDIAC ELECTROPHYSIOLOGY MAPPING AND ABLATION     CARPAL TUNNEL RELEASE Bilateral    CHOLECYSTECTOMY  2008   COLONOSCOPY WITH PROPOFOL N/A 05/07/2017   Procedure: COLONOSCOPY WITH PROPOFOL;  Surgeon: [name], MD;  Location: [facility] ENDOSCOPY;  Service: Gastroenterology;  Laterality: N/A;   COLONOSCOPY WITH PROPOFOL N/A 12/19/2020   Procedure: COLONOSCOPY WITH  PROPOFOL;  Surgeon: [name], MD;  Location: [facility] ENDOSCOPY;  Service: Gastroenterology;  Laterality: N/A;   ESOPHAGOGASTRODUODENOSCOPY (EGD) WITH PROPOFOL N/A 05/07/2017   Procedure: ESOPHAGOGASTRODUODENOSCOPY (EGD) WITH PROPOFOL;  Surgeon: [name], MD;  Location[facility] ENDOSCOPY;  Service: Gastroenterology;  Laterality: N/A;   FOOT SURGERY Bilateral    INCONTINENCE SURGERY  2009   INTERSTIM IMPLANT PLACEMENT     other     over active bladder   OTHER SURGICAL HISTORY     bladder stimulator    PARTIAL HYSTERECTOMY  03/1996   fibroids   PORTA CATH INSERTION N/A 03/10/2019   Procedure: PORTA CATH INSERTION;  Surgeon: Algernon Huxley, MD;  Location: [place] CV LAB;  Service: Cardiovascular;  Laterality: N/A;   TONSILLECTOMY  2007    Family History  Problem Relation Age of Onset   Colon cancer Father    Renal Disease Father    Diabetes Mellitus II Father    Melanoma Paternal Grandmother    Breast cancer Maternal Aunt 22   Anemia Mother    Heart disease Mother    Heart failure Mother  Renal Disease Mother    Congestive Heart Failure Mother    Heart disease Maternal Uncle    Throat cancer Maternal Uncle    Lung cancer Maternal Uncle    Liver disease Maternal Uncle    Heart failure Maternal Uncle    Hearing loss Son 17       Suicide     Social History:  reports that she quit smoking about 30 years ago. Her smoking use included cigarettes. She has a 20.00 pack-year smoking history. She has never used smokeless tobacco. She reports current alcohol use. She reports that she does not use drugs.  She is on disability. She notes exposure to perchloroethylene [facility]).   Allergies:  Allergies  Allergen Reactions   Oxycodone-Acetaminophen Anaphylaxis    Swelling and rash   Celebrex [Celecoxib] Diarrhea   Codeine    Plerixafor     In 2016 during ASCT collection patient developed fever to 103.41F and required hospitalization   Benadryl [Diphenhydramine] Palpitations   Morphine  Itching and Rash   Ondansetron Diarrhea   Tylenol [Acetaminophen] Itching and Rash    Current Medications: Current Outpatient Medications  Medication Sig Dispense Refill   acetaminophen (TYLENOL) 500 MG tablet Take 500 mg by mouth every 6 (six) hours as needed.     acyclovir (ZOVIRAX) 400 MG tablet Take 1 tablet (400 mg total) by mouth 2 (two) times daily. 180 tablet 1   albuterol (VENTOLIN HFA) 108 (90 Base) MCG/ACT inhaler Inhale 2 puffs into the lungs every 6 (six) hours as needed for wheezing or shortness of breath. 8 g 0   bisoprolol (ZEBETA) 5 MG tablet Take 5 mg by mouth daily.     FLUoxetine (PROZAC) 40 MG capsule TAKE 1 CAPSULE EVERY DAY 90 capsule 0   glucose blood test strip      montelukast (SINGULAIR) 10 MG tablet Take 1 tablet (10 mg total) by mouth See admin instructions. Take 1 tab daily for 2 days after daratumumab treatments 30 tablet 1   Multiple Vitamin (MULTIVITAMIN) tablet Take 1 tablet by mouth daily.     Multiple Vitamins-Minerals (HAIR/SKIN/NAILS) TABS Take 1 tablet by mouth daily.     omeprazole (PRILOSEC) 40 MG capsule TAKE 1 CAPSULE (40 MG TOTAL) BY MOUTH IN THE MORNING AND AT BEDTIME. 180 capsule 1   pregabalin (LYRICA) 25 MG capsule Take 25 mg by mouth in the morning and at bedtime.     promethazine (PHENERGAN) 25 MG tablet TAKE 1 TABLET EVERY 6 HOURS AS NEEDED FOR NAUSEA OR VOMITING 90 tablet 1   sucralfate (CARAFATE) 1 g tablet Take 1 tablet (1 g total) by mouth 4 (four) times daily -  with meals and at bedtime. 120 tablet 2   tiZANidine (ZANAFLEX) 2 MG tablet Take 1 tablet (2 mg total) by mouth every 6 (six) hours as needed for muscle spasms. 30 tablet 1   traZODone (DESYREL) 100 MG tablet TAKE 1 TABLET AT BEDTIME 90 tablet 0   allopurinol (ZYLOPRIM) 100 MG tablet TAKE 1 TABLET(100 MG) BY MOUTH DAILY as needed (Patient not taking: Reported on 07/04/2021) 90 tablet 1   diclofenac sodium (VOLTAREN) 1 % GEL Apply 2 g topically 4 (four) times daily. (Patient not  taking: Reported on 07/04/2021) 100 g 1   fluticasone (FLONASE) 50 MCG/ACT nasal spray Place 2 sprays into both nostrils daily. (Patient not taking: Reported on 07/04/2021) 16 g 2   promethazine-dextromethorphan (PROMETHAZINE-DM) 6.25-15 MG/5ML syrup Take 5 mLs by mouth 4 (four) times daily as  needed for cough. (Patient not taking: Reported on 07/04/2021) 180 mL 0   No current facility-administered medications for this visit.   Facility-Administered Medications Ordered in Other Visits  Medication Dose Route Frequency Provider Last Rate Last Admin   0.9 %  sodium chloride infusion   Intravenous Once Earlie Server, MD       heparin lock flush 100 UNIT/ML injection            magnesium sulfate IVPB 2 g 50 mL  2 g Intravenous Once Earlie Server, MD 50 mL/hr at 07/04/21 1241 2 g at 07/04/21 1241   magnesium sulfate IVPB 4 g 100 mL  4 g Intravenous Once Earlie Server, MD 50 mL/hr at 07/04/21 1043 4 g at 07/04/21 1043   montelukast (SINGULAIR) tablet 10 mg  10 mg Oral Once Earlie Server, MD       sodium chloride flush (NS) 0.9 % injection 10 mL  10 mL Intravenous PRN Earlie Server, MD   10 mL at 04/02/21 5320    Review of Systems  Constitutional:  Negative for chills, fever, malaise/fatigue and weight loss.  HENT:  Negative for sore throat.   Eyes:  Negative for redness.  Respiratory:  Negative for cough, shortness of breath and wheezing.   Cardiovascular:  Negative for chest pain, palpitations and leg swelling.  Gastrointestinal:  Positive for diarrhea and nausea. Negative for abdominal pain, blood in stool and vomiting.  Genitourinary:  Negative for dysuria.  Musculoskeletal:  Positive for back pain and joint pain. Negative for myalgias.  Skin:  Negative for rash.  Neurological:  Positive for tingling and sensory change. Negative for dizziness and tremors.  Endo/Heme/Allergies:  Does not bruise/bleed easily.  Psychiatric/Behavioral:  Negative for hallucinations.    Performance status (ECOG): 1  Vitals Blood pressure (!)  113/99, pulse 75, temperature (!) 97.2 F (36.2 C), resp. rate 18, weight 155 lb 12.8 oz (70.7 kg).  Physical Exam Vitals and nursing note reviewed.  Constitutional:      General: She is not in acute distress.    Appearance: She is well-developed. She is not ill-appearing, toxic-appearing or diaphoretic.     Interventions: Face mask in place.  HENT:     Head: Normocephalic and atraumatic.     Mouth/Throat:     Mouth: Mucous membranes are moist.     Pharynx: Oropharynx is clear.  Eyes:     General: No scleral icterus.    Pupils: Pupils are equal, round, and reactive to light.  Cardiovascular:     Rate and Rhythm: Normal rate and regular rhythm.     Heart sounds: Normal heart sounds. No murmur heard. Pulmonary:     Effort: Pulmonary effort is normal. No respiratory distress.     Breath sounds: Normal breath sounds. No wheezing or rales.  Chest:     Chest wall: No tenderness.  Abdominal:     General: Bowel sounds are normal. There is no distension.     Palpations: Abdomen is soft. There is no hepatomegaly, splenomegaly or mass.     Tenderness: There is no abdominal tenderness. There is no guarding or rebound.  Musculoskeletal:        General: No tenderness. Normal range of motion.     Cervical back: Normal range of motion and neck supple.     Right lower leg: No edema.     Left lower leg: No edema.  Lymphadenopathy:     Head:     Right side of head: No preauricular,  posterior auricular or occipital adenopathy.     Left side of head: No preauricular, posterior auricular or occipital adenopathy.     Cervical: No cervical adenopathy.     Upper Body:     Right upper body: No supraclavicular adenopathy.     Left upper body: No supraclavicular adenopathy.  Skin:    General: Skin is warm and dry.  Neurological:     Mental Status: She is alert and oriented to person, place, and time. Mental status is at baseline.  Psychiatric:        Mood and Affect: Mood normal.   Laboratory  data Infusion on 07/04/2021  Component Date Value Ref Range Status   Magnesium 07/04/2021 1.0 (L)  1.7 - 2.4 mg/dL Final   Performed at [facility], [place], [place] [address]   Sodium 07/04/2021 135  135 - 145 mmol/L Final   Potassium 07/04/2021 4.1  3.5 - 5.1 mmol/L Final   Chloride 07/04/2021 102  98 - 111 mmol/L Final   CO2 07/04/2021 23  22 - 32 mmol/L Final   Glucose, Bld 07/04/2021 158 (H)  70 - 99 mg/dL Final   Glucose reference range applies only to samples taken after fasting for at least 8 hours.   BUN 07/04/2021 22  8 - 23 mg/dL Final   Creatinine, Ser 07/04/2021 1.21 (H)  0.44 - 1.00 mg/dL Final   Calcium 07/04/2021 9.1  8.9 - 10.3 mg/dL Final   Total Protein 07/04/2021 7.0  6.5 - 8.1 g/dL Final   Albumin 07/04/2021 4.3  3.5 - 5.0 g/dL Final   AST 07/04/2021 51 (H)  15 - 41 U/L Final   ALT 07/04/2021 56 (H)  0 - 44 U/L Final   Alkaline Phosphatase 07/04/2021 96  38 - 126 U/L Final   Total Bilirubin 07/04/2021 0.2 (L)  0.3 - 1.2 mg/dL Final   GFR, Estimated 07/04/2021 50 (L)  >60 mL/min Final   Comment: (NOTE) Calculated using the CKD-EPI Creatinine Equation (2021)    Anion gap 07/04/2021 10  5 - 15 Final   Performed at [facility], [place], [place] [address]   WBC 07/04/2021 5.3  4.0 - 10.5 K/uL Final   RBC 07/04/2021 4.36  3.87 - 5.11 MIL/uL Final   Hemoglobin 07/04/2021 11.9 (L)  12.0 - 15.0 g/dL Final   HCT 07/04/2021 35.3 (L)  36.0 - 46.0 % Final   MCV 07/04/2021 81.0  80.0 - 100.0 fL Final   MCH 07/04/2021 27.3  26.0 - 34.0 pg Final   MCHC 07/04/2021 33.7  30.0 - 36.0 g/dL Final   RDW 07/04/2021 13.7  11.5 - 15.5 % Final   Platelets 07/04/2021 114 (L)  150 - 400 K/uL Final   nRBC 07/04/2021 0.0  0.0 - 0.2 % Final   Neutrophils Relative % 07/04/2021 63  % Final   Neutro Abs 07/04/2021 3.3  1.7 - 7.7 K/uL Final   Lymphocytes Relative 07/04/2021 25  % Final   Lymphs Abs 07/04/2021 1.3  0.7 - 4.0 K/uL Final    Monocytes Relative 07/04/2021 9  % Final   Monocytes Absolute 07/04/2021 0.5  0.1 - 1.0 K/uL Final   Eosinophils Relative 07/04/2021 2  % Final   Eosinophils Absolute 07/04/2021 0.1  0.0 - 0.5 K/uL Final   Basophils Relative 07/04/2021 1  % Final   Basophils Absolute 07/04/2021 0.0  0.0 - 0.1 K/uL Final   Immature Granulocytes 07/04/2021 0  %  Final   Abs Immature Granulocytes 07/04/2021 0.02  0.00 - 0.07 K/uL Final   Performed at [facility], [address] [place], [place] [address]    Assessment/Plan 1. Encounter for antineoplastic chemotherapy   2. Multiple myeloma in remission ([place])   3. Hypomagnesemia   4. B12 deficiency   5. Chemotherapy-induced neuropathy ([place])   6. 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[place])   7. Chronic nausea     # Recurrent lambda light chain multiple myeloma s/p second autologous bone marrow transplant [05/10/2020] Most recent Multiple myeloma showed negative M protein.  Light chain ratio is normal. 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  Attention on follow-up. Currently Daratumumab injections every 4 weeks.   She tolerates well.   Labs reviewed and discussed with patient.  Proceed with Daratumumab injection.Dexamethasone 58m x 1.   Continue prophylaxis with Valtrex up to 1 year after transplant-November 2022 Vaccination at [facility] RN coordinator ([phone].  #Hyperglobulinemia post stem cell transplantation.  May consider  IVIG replacement therapy if she has frequent infections. .-Labs reviewed with patient.  Most recent IgG levels are normal.  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   Follows up with neurology.  Switched to Lyrica.  #chronic diarrhea, Gastroenterology recommendation was reviewed.  Avoid artificial sugar.  Colonoscopy showed no microscopic colitis. Off Velcade  now.  Slightly improved symptoms.  #Chronic hypomagnesemia Magnesium is 1.0 today, proceed with  IV magnesium today. 6 g Continue weekly magnesium +/- IV magnesium.  Not able to tolerate Slow-Mag.  #Dysphagia, chronic nausea, patient request to have IV Phenergan in the clinic with treatment.    # B12 deficiency, patient has been on vitamin B12 monthly. Last dose 11/09/2020. Vitamin B12 is 404 continue B12 injections monthly.  #Kidney function slightly worse today her baseline.  Encourage oral hydration.  Patient received 1 L of normal saline monitor x1 today.  RTC: weekly  lab magnesium/IV magnesium weekly x  3 RTC in 4 weeks for labs (CBC with diff, CMP, Mg ), md  for Daratumumab and IV Mg   I discussed the assessment and treatment plan with the patient.  The patient was provided an opportunity to ask questions and all were answered.  The patient agreed with the plan and demonstrated an understanding of the instructions.  The patient was advised to call back if the symptoms worsen or if the condition fails to improve as anticipated.   ZEarlie Server MD, PhD 07/04/2021

## 2021-07-04 NOTE — Progress Notes (Signed)
Pt here for follow up. She report that she was in the hospital during christmas due to fever. She had a UTI and received antibiotics.

## 2021-07-04 NOTE — Progress Notes (Signed)
Per MD to proceed with Daratumumab SQ, 1 L of NS, and 6 mg of Mag. Treatment team updated.   Sarah [name]

## 2021-07-05 LAB — KAPPA/LAMBDA LIGHT CHAINS
Kappa free light chain: 5.3 mg/L (ref 3.3–19.4)
Kappa, lambda light chain ratio: 0.5 (ref 0.26–1.65)
Lambda free light chains: 10.7 mg/L (ref 5.7–26.3)

## 2021-07-09 LAB — MULTIPLE MYELOMA PANEL, SERUM
Albumin SerPl Elph-Mcnc: 3.9 g/dL (ref 2.9–4.4)
Albumin/Glob SerPl: 1.4 (ref 0.7–1.7)
Alpha 1: 0.3 g/dL (ref 0.0–0.4)
Alpha2 Glob SerPl Elph-Mcnc: 1 g/dL (ref 0.4–1.0)
B-Globulin SerPl Elph-Mcnc: 1 g/dL (ref 0.7–1.3)
Gamma Glob SerPl Elph-Mcnc: 0.5 g/dL (ref 0.4–1.8)
Globulin, Total: 2.8 g/dL (ref 2.2–3.9)
IgA: 19 mg/dL — ABNORMAL LOW (ref 87–352)
IgG (Immunoglobin G), Serum: 420 mg/dL — ABNORMAL LOW (ref 586–1602)
IgM (Immunoglobulin M), Srm: 17 mg/dL — ABNORMAL LOW (ref 26–217)
Total Protein ELP: 6.7 g/dL (ref 6.0–8.5)

## 2021-07-12 ENCOUNTER — Other Ambulatory Visit: Payer: Self-pay

## 2021-07-12 ENCOUNTER — Inpatient Hospital Stay: Payer: Medicare HMO

## 2021-07-12 DIAGNOSIS — C9000 Multiple myeloma not having achieved remission: Secondary | ICD-10-CM | POA: Diagnosis not present

## 2021-07-12 DIAGNOSIS — E119 Type 2 diabetes mellitus without complications: Secondary | ICD-10-CM | POA: Diagnosis not present

## 2021-07-12 DIAGNOSIS — M879 Osteonecrosis, unspecified: Secondary | ICD-10-CM | POA: Diagnosis not present

## 2021-07-12 DIAGNOSIS — R110 Nausea: Secondary | ICD-10-CM | POA: Diagnosis not present

## 2021-07-12 DIAGNOSIS — R197 Diarrhea, unspecified: Secondary | ICD-10-CM | POA: Diagnosis not present

## 2021-07-12 DIAGNOSIS — I10 Essential (primary) hypertension: Secondary | ICD-10-CM | POA: Diagnosis not present

## 2021-07-12 DIAGNOSIS — R1310 Dysphagia, unspecified: Secondary | ICD-10-CM | POA: Diagnosis not present

## 2021-07-12 DIAGNOSIS — Z5112 Encounter for antineoplastic immunotherapy: Secondary | ICD-10-CM | POA: Diagnosis not present

## 2021-07-12 DIAGNOSIS — E538 Deficiency of other specified B group vitamins: Secondary | ICD-10-CM | POA: Diagnosis not present

## 2021-07-12 LAB — CBC WITH DIFFERENTIAL/PLATELET
Abs Immature Granulocytes: 0.03 10*3/uL (ref 0.00–0.07)
Basophils Absolute: 0 10*3/uL (ref 0.0–0.1)
Basophils Relative: 0 %
Eosinophils Absolute: 0.1 10*3/uL (ref 0.0–0.5)
Eosinophils Relative: 2 %
HCT: 35.5 % — ABNORMAL LOW (ref 36.0–46.0)
Hemoglobin: 11.8 g/dL — ABNORMAL LOW (ref 12.0–15.0)
Immature Granulocytes: 0 %
Lymphocytes Relative: 20 %
Lymphs Abs: 1.5 10*3/uL (ref 0.7–4.0)
MCH: 27.3 pg (ref 26.0–34.0)
MCHC: 33.2 g/dL (ref 30.0–36.0)
MCV: 82.2 fL (ref 80.0–100.0)
Monocytes Absolute: 0.6 10*3/uL (ref 0.1–1.0)
Monocytes Relative: 8 %
Neutro Abs: 5.3 10*3/uL (ref 1.7–7.7)
Neutrophils Relative %: 70 %
Platelets: 125 10*3/uL — ABNORMAL LOW (ref 150–400)
RBC: 4.32 MIL/uL (ref 3.87–5.11)
RDW: 14.2 % (ref 11.5–15.5)
WBC: 7.6 10*3/uL (ref 4.0–10.5)
nRBC: 0 % (ref 0.0–0.2)

## 2021-07-12 LAB — COMPREHENSIVE METABOLIC PANEL
ALT: 57 U/L — ABNORMAL HIGH (ref 0–44)
AST: 48 U/L — ABNORMAL HIGH (ref 15–41)
Albumin: 4.5 g/dL (ref 3.5–5.0)
Alkaline Phosphatase: 99 U/L (ref 38–126)
Anion gap: 8 (ref 5–15)
BUN: 21 mg/dL (ref 8–23)
CO2: 23 mmol/L (ref 22–32)
Calcium: 8.9 mg/dL (ref 8.9–10.3)
Chloride: 103 mmol/L (ref 98–111)
Creatinine, Ser: 0.92 mg/dL (ref 0.44–1.00)
GFR, Estimated: >60 mL/min (ref >60)
Glucose, Bld: 132 mg/dL — ABNORMAL HIGH (ref 70–99)
Potassium: 4.1 mmol/L (ref 3.5–5.1)
Sodium: 134 mmol/L — ABNORMAL LOW (ref 135–145)
Total Bilirubin: 0.7 mg/dL (ref 0.3–1.2)
Total Protein: 7.1 g/dL (ref 6.5–8.1)

## 2021-07-12 LAB — MAGNESIUM: Magnesium: 1.1 mg/dL — ABNORMAL LOW (ref 1.7–2.4)

## 2021-07-12 MED ORDER — MAGNESIUM SULFATE 4 GM/100ML IV SOLN
INTRAVENOUS | Status: AC
  Filled 2021-07-12: qty 100

## 2021-07-12 MED ORDER — MAGNESIUM SULFATE 2 GM/50ML IV SOLN
INTRAVENOUS | Status: AC
  Filled 2021-07-12: qty 50

## 2021-07-12 MED ORDER — MAGNESIUM SULFATE 2 GM/50ML IV SOLN
2.0000 g | Freq: Once | INTRAVENOUS | Status: AC
  Administered 2021-07-12: 2 g via INTRAVENOUS

## 2021-07-12 MED ORDER — MAGNESIUM SULFATE 4 GM/100ML IV SOLN
4.0000 g | Freq: Once | INTRAVENOUS | Status: AC
  Administered 2021-07-12: 4 g via INTRAVENOUS

## 2021-07-12 MED ORDER — CYANOCOBALAMIN 1000 MCG/ML IJ SOLN
1000.0000 ug | Freq: Once | INTRAMUSCULAR | Status: AC
  Administered 2021-07-12: 1000 ug via INTRAMUSCULAR
  Filled 2021-07-12: qty 1

## 2021-07-12 MED ORDER — HEPARIN SOD (PORK) LOCK FLUSH 100 UNIT/ML IV SOLN
500.0000 [IU] | Freq: Once | INTRAVENOUS | Status: AC
  Administered 2021-07-12: 500 [IU] via INTRAVENOUS
  Filled 2021-07-12: qty 5

## 2021-07-12 MED ORDER — SODIUM CHLORIDE 0.9 % IV SOLN: 4.0000 g | Freq: Once | INTRAVENOUS | Status: DC

## 2021-07-12 MED ORDER — SODIUM CHLORIDE 0.9 % IV SOLN
25.0000 mg | Freq: Once | INTRAVENOUS | Status: AC
  Administered 2021-07-12: 25 mg via INTRAVENOUS
  Filled 2021-07-12: qty 1

## 2021-07-13 DIAGNOSIS — B999 Unspecified infectious disease: Secondary | ICD-10-CM | POA: Diagnosis not present

## 2021-07-13 DIAGNOSIS — Z1159 Encounter for screening for other viral diseases: Secondary | ICD-10-CM | POA: Diagnosis not present

## 2021-07-13 DIAGNOSIS — C9000 Multiple myeloma not having achieved remission: Secondary | ICD-10-CM | POA: Diagnosis not present

## 2021-07-13 DIAGNOSIS — R7989 Other specified abnormal findings of blood chemistry: Secondary | ICD-10-CM | POA: Diagnosis not present

## 2021-07-19 ENCOUNTER — Inpatient Hospital Stay: Payer: Medicare HMO

## 2021-07-19 ENCOUNTER — Other Ambulatory Visit: Payer: Self-pay

## 2021-07-19 ENCOUNTER — Other Ambulatory Visit: Payer: Medicare HMO

## 2021-07-19 ENCOUNTER — Other Ambulatory Visit: Payer: Self-pay | Admitting: Geriatric Medicine

## 2021-07-19 DIAGNOSIS — R7989 Other specified abnormal findings of blood chemistry: Secondary | ICD-10-CM

## 2021-07-19 DIAGNOSIS — M879 Osteonecrosis, unspecified: Secondary | ICD-10-CM | POA: Diagnosis not present

## 2021-07-19 DIAGNOSIS — I10 Essential (primary) hypertension: Secondary | ICD-10-CM | POA: Diagnosis not present

## 2021-07-19 DIAGNOSIS — R110 Nausea: Secondary | ICD-10-CM | POA: Diagnosis not present

## 2021-07-19 DIAGNOSIS — E119 Type 2 diabetes mellitus without complications: Secondary | ICD-10-CM | POA: Diagnosis not present

## 2021-07-19 DIAGNOSIS — Z5112 Encounter for antineoplastic immunotherapy: Secondary | ICD-10-CM | POA: Diagnosis not present

## 2021-07-19 DIAGNOSIS — R1310 Dysphagia, unspecified: Secondary | ICD-10-CM | POA: Diagnosis not present

## 2021-07-19 DIAGNOSIS — C9000 Multiple myeloma not having achieved remission: Secondary | ICD-10-CM

## 2021-07-19 DIAGNOSIS — R197 Diarrhea, unspecified: Secondary | ICD-10-CM | POA: Diagnosis not present

## 2021-07-19 DIAGNOSIS — E538 Deficiency of other specified B group vitamins: Secondary | ICD-10-CM | POA: Diagnosis not present

## 2021-07-19 LAB — CBC WITH DIFFERENTIAL/PLATELET
Abs Immature Granulocytes: 0.02 10*3/uL (ref 0.00–0.07)
Basophils Absolute: 0 10*3/uL (ref 0.0–0.1)
Basophils Relative: 0 %
Eosinophils Absolute: 0.1 10*3/uL (ref 0.0–0.5)
Eosinophils Relative: 2 %
HCT: 35.3 % — ABNORMAL LOW (ref 36.0–46.0)
Hemoglobin: 11.7 g/dL — ABNORMAL LOW (ref 12.0–15.0)
Immature Granulocytes: 0 %
Lymphocytes Relative: 19 %
Lymphs Abs: 1.2 10*3/uL (ref 0.7–4.0)
MCH: 27.1 pg (ref 26.0–34.0)
MCHC: 33.1 g/dL (ref 30.0–36.0)
MCV: 81.9 fL (ref 80.0–100.0)
Monocytes Absolute: 0.5 10*3/uL (ref 0.1–1.0)
Monocytes Relative: 7 %
Neutro Abs: 4.6 10*3/uL (ref 1.7–7.7)
Neutrophils Relative %: 72 %
Platelets: 102 10*3/uL — ABNORMAL LOW (ref 150–400)
RBC: 4.31 MIL/uL (ref 3.87–5.11)
RDW: 14.3 % (ref 11.5–15.5)
WBC: 6.5 10*3/uL (ref 4.0–10.5)
nRBC: 0 % (ref 0.0–0.2)

## 2021-07-19 LAB — COMPREHENSIVE METABOLIC PANEL
ALT: 62 U/L — ABNORMAL HIGH (ref 0–44)
AST: 52 U/L — ABNORMAL HIGH (ref 15–41)
Albumin: 4.4 g/dL (ref 3.5–5.0)
Alkaline Phosphatase: 94 U/L (ref 38–126)
Anion gap: 7 (ref 5–15)
BUN: 21 mg/dL (ref 8–23)
CO2: 25 mmol/L (ref 22–32)
Calcium: 8.7 mg/dL — ABNORMAL LOW (ref 8.9–10.3)
Chloride: 105 mmol/L (ref 98–111)
Creatinine, Ser: 0.83 mg/dL (ref 0.44–1.00)
GFR, Estimated: >60 mL/min (ref >60)
Glucose, Bld: 171 mg/dL — ABNORMAL HIGH (ref 70–99)
Potassium: 4.1 mmol/L (ref 3.5–5.1)
Sodium: 137 mmol/L (ref 135–145)
Total Bilirubin: 0.6 mg/dL (ref 0.3–1.2)
Total Protein: 7 g/dL (ref 6.5–8.1)

## 2021-07-19 LAB — MAGNESIUM: Magnesium: 1.2 mg/dL — ABNORMAL LOW (ref 1.7–2.4)

## 2021-07-19 MED ORDER — SODIUM CHLORIDE 0.9 % IV SOLN
25.0000 mg | Freq: Once | INTRAVENOUS | Status: AC
  Administered 2021-07-19: 25 mg via INTRAVENOUS
  Filled 2021-07-19: qty 1

## 2021-07-19 MED ORDER — SODIUM CHLORIDE 0.9% FLUSH
10.0000 mL | Freq: Once | INTRAVENOUS | Status: AC
  Administered 2021-07-19: 10 mL via INTRAVENOUS
  Filled 2021-07-19: qty 10

## 2021-07-19 MED ORDER — MAGNESIUM SULFATE 2 GM/50ML IV SOLN
2.0000 g | Freq: Once | INTRAVENOUS | Status: AC
  Administered 2021-07-19: 2 g via INTRAVENOUS
  Filled 2021-07-19: qty 50

## 2021-07-19 MED ORDER — SODIUM CHLORIDE 0.9 % IV SOLN: 6.0000 g | Freq: Once | INTRAVENOUS | Status: DC

## 2021-07-19 MED ORDER — SODIUM CHLORIDE 0.9 % IV SOLN
INTRAVENOUS | Status: DC
  Filled 2021-07-19: qty 250

## 2021-07-19 MED ORDER — MAGNESIUM SULFATE 4 GM/100ML IV SOLN
4.0000 g | Freq: Once | INTRAVENOUS | Status: AC
  Administered 2021-07-19: 4 g via INTRAVENOUS
  Filled 2021-07-19: qty 100

## 2021-07-19 MED ORDER — HEPARIN SOD (PORK) LOCK FLUSH 100 UNIT/ML IV SOLN
500.0000 [IU] | Freq: Once | INTRAVENOUS | Status: AC
  Administered 2021-07-19: 500 [IU] via INTRAVENOUS
  Filled 2021-07-19: qty 5

## 2021-07-19 NOTE — Patient Instructions (Signed)
Magnesium Sulfate Injection What is this medication? MAGNESIUM SULFATE (mag NEE zee um SUL fate) prevents and treats low levels of magnesium in your body. It may also be used to prevent and treat seizures during pregnancy in people with high blood pressure disorders, such as preeclampsia or eclampsia. Magnesium plays an important role in maintaining the health of your muscles and nervous system. This medicine may be used for other purposes; ask your health care provider or pharmacist if you have questions. What should I tell my care team before I take this medication? They need to know if you have any of these conditions: Heart disease History of irregular heart beat Kidney disease An unusual or allergic reaction to magnesium sulfate, medications, foods, dyes, or preservatives Pregnant or trying to get pregnant Breast-feeding How should I use this medication? This medication is for infusion into a vein. It is given in a hospital or clinic setting. Talk to your care team about the use of this medication in children. While this medication may be prescribed for selected conditions, precautions do apply. Overdosage: If you think you have taken too much of this medicine contact a poison control center or emergency room at once. NOTE: This medicine is only for you. Do not share this medicine with others. What if I miss a dose? This does not apply. What may interact with this medication? Certain medications for anxiety or sleep Certain medications for seizures like phenobarbital Digoxin Medications that relax muscles for surgery Narcotic medications for pain This list may not describe all possible interactions. Give your health care provider a list of all the medicines, herbs, non-prescription drugs, or dietary supplements you use. Also tell them if you smoke, drink alcohol, or use illegal drugs. Some items may interact with your medicine. What should I watch for while using this medication? Your  condition will be monitored carefully while you are receiving this medication. You may need blood work done while you are receiving this medication. What side effects may I notice from receiving this medication? Side effects that you should report to your care team as soon as possible: Allergic reactions--skin rash, itching, hives, swelling of the face, lips, tongue, or throat High magnesium level--confusion, drowsiness, facial flushing, redness, sweating, muscle weakness, fast or irregular heartbeat, trouble breathing Low blood pressure--dizziness, feeling faint or lightheaded, blurry vision Side effects that usually do not require medical attention (report to your care team if they continue or are bothersome): Headache Nausea This list may not describe all possible side effects. Call your doctor for medical advice about side effects. You may report side effects to FDA at 1-800-FDA-1088. Where should I keep my medication? This medication is given in a hospital or clinic and will not be stored at home. NOTE: This sheet is a summary. It may not cover all possible information. If you have questions about this medicine, talk to your doctor, pharmacist, or health care provider.  2022 Elsevier/Gold Standard (2020-08-31 00:00:00)  

## 2021-07-23 DIAGNOSIS — R200 Anesthesia of skin: Secondary | ICD-10-CM | POA: Diagnosis not present

## 2021-07-23 DIAGNOSIS — M545 Low back pain, unspecified: Secondary | ICD-10-CM | POA: Diagnosis not present

## 2021-07-23 DIAGNOSIS — M542 Cervicalgia: Secondary | ICD-10-CM | POA: Diagnosis not present

## 2021-07-23 DIAGNOSIS — R202 Paresthesia of skin: Secondary | ICD-10-CM | POA: Diagnosis not present

## 2021-07-23 DIAGNOSIS — T451X5A Adverse effect of antineoplastic and immunosuppressive drugs, initial encounter: Secondary | ICD-10-CM | POA: Diagnosis not present

## 2021-07-23 DIAGNOSIS — G620 Drug-induced polyneuropathy: Secondary | ICD-10-CM | POA: Diagnosis not present

## 2021-07-24 DIAGNOSIS — I350 Nonrheumatic aortic (valve) stenosis: Secondary | ICD-10-CM | POA: Diagnosis not present

## 2021-07-24 DIAGNOSIS — Z23 Encounter for immunization: Secondary | ICD-10-CM | POA: Diagnosis not present

## 2021-07-24 DIAGNOSIS — C9000 Multiple myeloma not having achieved remission: Secondary | ICD-10-CM | POA: Diagnosis not present

## 2021-07-24 DIAGNOSIS — Z48290 Encounter for aftercare following bone marrow transplant: Secondary | ICD-10-CM | POA: Diagnosis not present

## 2021-07-24 DIAGNOSIS — R197 Diarrhea, unspecified: Secondary | ICD-10-CM | POA: Diagnosis not present

## 2021-07-24 DIAGNOSIS — Z9484 Stem cells transplant status: Secondary | ICD-10-CM | POA: Diagnosis not present

## 2021-07-24 DIAGNOSIS — D849 Immunodeficiency, unspecified: Secondary | ICD-10-CM | POA: Diagnosis not present

## 2021-07-24 DIAGNOSIS — Q251 Coarctation of aorta: Secondary | ICD-10-CM | POA: Diagnosis not present

## 2021-07-24 DIAGNOSIS — B955 Unspecified streptococcus as the cause of diseases classified elsewhere: Secondary | ICD-10-CM | POA: Diagnosis not present

## 2021-07-25 ENCOUNTER — Other Ambulatory Visit: Payer: Self-pay

## 2021-07-25 ENCOUNTER — Ambulatory Visit
Admission: RE | Admit: 2021-07-25 | Discharge: 2021-07-25 | Disposition: A | Discharge status: Home / Self Care | Payer: Medicare HMO | Source: Ambulatory Visit | Attending: Geriatric Medicine | Admitting: Geriatric Medicine

## 2021-07-25 DIAGNOSIS — R7989 Other specified abnormal findings of blood chemistry: Secondary | ICD-10-CM | POA: Diagnosis not present

## 2021-07-25 DIAGNOSIS — K760 Fatty (change of) liver, not elsewhere classified: Secondary | ICD-10-CM | POA: Diagnosis not present

## 2021-07-25 DIAGNOSIS — R945 Abnormal results of liver function studies: Secondary | ICD-10-CM | POA: Diagnosis not present

## 2021-07-26 ENCOUNTER — Other Ambulatory Visit: Payer: Medicare HMO

## 2021-07-26 ENCOUNTER — Ambulatory Visit: Payer: Medicare HMO

## 2021-07-27 ENCOUNTER — Telehealth: Payer: Self-pay | Admitting: Oncology

## 2021-07-27 NOTE — Telephone Encounter (Signed)
Pt called asking why is she down for two days of service? She can do the appts for 2-1. Call back to confirm at [phone]

## 2021-07-27 NOTE — Telephone Encounter (Signed)
Can you contact patient for me. Sarah [name] is gone for the day. Thank you

## 2021-07-30 NOTE — Telephone Encounter (Signed)
Hey, Per Dr. Tasia Catchings it is ok for her to proceed with all tx plan on 2/1.

## 2021-07-31 ENCOUNTER — Inpatient Hospital Stay: Payer: Medicare HMO

## 2021-07-31 DIAGNOSIS — M5416 Radiculopathy, lumbar region: Secondary | ICD-10-CM | POA: Diagnosis not present

## 2021-07-31 DIAGNOSIS — N182 Chronic kidney disease, stage 2 (mild): Secondary | ICD-10-CM | POA: Diagnosis not present

## 2021-07-31 DIAGNOSIS — D631 Anemia in chronic kidney disease: Secondary | ICD-10-CM | POA: Diagnosis not present

## 2021-07-31 DIAGNOSIS — I10 Essential (primary) hypertension: Secondary | ICD-10-CM | POA: Diagnosis not present

## 2021-07-31 DIAGNOSIS — E1122 Type 2 diabetes mellitus with diabetic chronic kidney disease: Secondary | ICD-10-CM | POA: Diagnosis not present

## 2021-07-31 DIAGNOSIS — M5136 Other intervertebral disc degeneration, lumbar region: Secondary | ICD-10-CM | POA: Diagnosis not present

## 2021-07-31 DIAGNOSIS — Z862 Personal history of diseases of the blood and blood-forming organs and certain disorders involving the immune mechanism: Secondary | ICD-10-CM | POA: Diagnosis not present

## 2021-07-31 DIAGNOSIS — M48062 Spinal stenosis, lumbar region with neurogenic claudication: Secondary | ICD-10-CM | POA: Diagnosis not present

## 2021-08-01 ENCOUNTER — Inpatient Hospital Stay: Payer: Medicare HMO | Attending: Oncology

## 2021-08-01 ENCOUNTER — Encounter: Payer: Self-pay | Admitting: Oncology

## 2021-08-01 ENCOUNTER — Inpatient Hospital Stay: Payer: Medicare HMO | Admitting: Oncology

## 2021-08-01 ENCOUNTER — Other Ambulatory Visit: Payer: Self-pay

## 2021-08-01 ENCOUNTER — Inpatient Hospital Stay: Payer: Medicare HMO

## 2021-08-01 VITALS — BP 129/83 | HR 69 | Temp 97.1°F | Resp 18 | Wt 160.4 lb

## 2021-08-01 DIAGNOSIS — Z832 Family history of diseases of the blood and blood-forming organs and certain disorders involving the immune mechanism: Secondary | ICD-10-CM | POA: Diagnosis not present

## 2021-08-01 DIAGNOSIS — Z808 Family history of malignant neoplasm of other organs or systems: Secondary | ICD-10-CM | POA: Diagnosis not present

## 2021-08-01 DIAGNOSIS — Z800 Family history of malignant neoplasm of digestive organs: Secondary | ICD-10-CM | POA: Diagnosis not present

## 2021-08-01 DIAGNOSIS — Z79899 Other long term (current) drug therapy: Secondary | ICD-10-CM | POA: Insufficient documentation

## 2021-08-01 DIAGNOSIS — T451X5A Adverse effect of antineoplastic and immunosuppressive drugs, initial encounter: Secondary | ICD-10-CM | POA: Diagnosis not present

## 2021-08-01 DIAGNOSIS — D801 Nonfamilial hypogammaglobulinemia: Secondary | ICD-10-CM | POA: Diagnosis not present

## 2021-08-01 DIAGNOSIS — Z833 Family history of diabetes mellitus: Secondary | ICD-10-CM | POA: Diagnosis not present

## 2021-08-01 DIAGNOSIS — Z801 Family history of malignant neoplasm of trachea, bronchus and lung: Secondary | ICD-10-CM | POA: Insufficient documentation

## 2021-08-01 DIAGNOSIS — G620 Drug-induced polyneuropathy: Secondary | ICD-10-CM

## 2021-08-01 DIAGNOSIS — Z87891 Personal history of nicotine dependence: Secondary | ICD-10-CM | POA: Insufficient documentation

## 2021-08-01 DIAGNOSIS — E538 Deficiency of other specified B group vitamins: Secondary | ICD-10-CM | POA: Diagnosis not present

## 2021-08-01 DIAGNOSIS — Z803 Family history of malignant neoplasm of breast: Secondary | ICD-10-CM | POA: Diagnosis not present

## 2021-08-01 DIAGNOSIS — C9001 Multiple myeloma in remission: Secondary | ICD-10-CM

## 2021-08-01 DIAGNOSIS — Z841 Family history of disorders of kidney and ureter: Secondary | ICD-10-CM | POA: Diagnosis not present

## 2021-08-01 DIAGNOSIS — E119 Type 2 diabetes mellitus without complications: Secondary | ICD-10-CM | POA: Insufficient documentation

## 2021-08-01 DIAGNOSIS — Z822 Family history of deafness and hearing loss: Secondary | ICD-10-CM | POA: Insufficient documentation

## 2021-08-01 DIAGNOSIS — Z8249 Family history of ischemic heart disease and other diseases of the circulatory system: Secondary | ICD-10-CM | POA: Insufficient documentation

## 2021-08-01 DIAGNOSIS — R110 Nausea: Secondary | ICD-10-CM | POA: Diagnosis not present

## 2021-08-01 DIAGNOSIS — Z5112 Encounter for antineoplastic immunotherapy: Secondary | ICD-10-CM | POA: Diagnosis not present

## 2021-08-01 DIAGNOSIS — Z8379 Family history of other diseases of the digestive system: Secondary | ICD-10-CM | POA: Insufficient documentation

## 2021-08-01 DIAGNOSIS — C9000 Multiple myeloma not having achieved remission: Secondary | ICD-10-CM

## 2021-08-01 DIAGNOSIS — D649 Anemia, unspecified: Secondary | ICD-10-CM

## 2021-08-01 DIAGNOSIS — Z5111 Encounter for antineoplastic chemotherapy: Secondary | ICD-10-CM

## 2021-08-01 DIAGNOSIS — Z9484 Stem cells transplant status: Secondary | ICD-10-CM | POA: Diagnosis not present

## 2021-08-01 DIAGNOSIS — I10 Essential (primary) hypertension: Secondary | ICD-10-CM | POA: Insufficient documentation

## 2021-08-01 LAB — CBC WITH DIFFERENTIAL/PLATELET
Abs Immature Granulocytes: 0.01 10*3/uL (ref 0.00–0.07)
Basophils Absolute: 0 10*3/uL (ref 0.0–0.1)
Basophils Relative: 1 %
Eosinophils Absolute: 0.2 10*3/uL (ref 0.0–0.5)
Eosinophils Relative: 4 %
HCT: 36 % (ref 36.0–46.0)
Hemoglobin: 11.9 g/dL — ABNORMAL LOW (ref 12.0–15.0)
Immature Granulocytes: 0 %
Lymphocytes Relative: 20 %
Lymphs Abs: 1 10*3/uL (ref 0.7–4.0)
MCH: 27.1 pg (ref 26.0–34.0)
MCHC: 33.1 g/dL (ref 30.0–36.0)
MCV: 82 fL (ref 80.0–100.0)
Monocytes Absolute: 0.4 10*3/uL (ref 0.1–1.0)
Monocytes Relative: 8 %
Neutro Abs: 3.5 10*3/uL (ref 1.7–7.7)
Neutrophils Relative %: 67 %
Platelets: 113 10*3/uL — ABNORMAL LOW (ref 150–400)
RBC: 4.39 MIL/uL (ref 3.87–5.11)
RDW: 14.4 % (ref 11.5–15.5)
WBC: 5.2 10*3/uL (ref 4.0–10.5)
nRBC: 0 % (ref 0.0–0.2)

## 2021-08-01 LAB — MAGNESIUM: Magnesium: 1.1 mg/dL — ABNORMAL LOW (ref 1.7–2.4)

## 2021-08-01 LAB — COMPREHENSIVE METABOLIC PANEL
ALT: 62 U/L — ABNORMAL HIGH (ref 0–44)
AST: 53 U/L — ABNORMAL HIGH (ref 15–41)
Albumin: 4.4 g/dL (ref 3.5–5.0)
Alkaline Phosphatase: 90 U/L (ref 38–126)
Anion gap: 8 (ref 5–15)
BUN: 23 mg/dL (ref 8–23)
CO2: 22 mmol/L (ref 22–32)
Calcium: 9.1 mg/dL (ref 8.9–10.3)
Chloride: 105 mmol/L (ref 98–111)
Creatinine, Ser: 1.09 mg/dL — ABNORMAL HIGH (ref 0.44–1.00)
GFR, Estimated: 57 mL/min — ABNORMAL LOW (ref >60)
Glucose, Bld: 161 mg/dL — ABNORMAL HIGH (ref 70–99)
Potassium: 3.9 mmol/L (ref 3.5–5.1)
Sodium: 135 mmol/L (ref 135–145)
Total Bilirubin: 0.5 mg/dL (ref 0.3–1.2)
Total Protein: 7 g/dL (ref 6.5–8.1)

## 2021-08-01 MED ORDER — DIPHENHYDRAMINE HCL 25 MG PO CAPS
25.0000 mg | ORAL_CAPSULE | Freq: Once | ORAL | Status: AC
  Administered 2021-08-01: 25 mg via ORAL
  Filled 2021-08-01: qty 1

## 2021-08-01 MED ORDER — DARATUMUMAB-HYALURONIDASE-FIHJ 1800-30000 MG-UT/15ML ~~LOC~~ SOLN
1800.0000 mg | Freq: Once | SUBCUTANEOUS | Status: AC
  Administered 2021-08-01: 1800 mg via SUBCUTANEOUS
  Filled 2021-08-01: qty 15

## 2021-08-01 MED ORDER — MAGNESIUM SULFATE 4 GM/100ML IV SOLN
4.0000 g | Freq: Once | INTRAVENOUS | Status: AC
  Administered 2021-08-01: 4 g via INTRAVENOUS
  Filled 2021-08-01: qty 100

## 2021-08-01 MED ORDER — SODIUM CHLORIDE 0.9 % IV SOLN: 6.0000 g | Freq: Once | INTRAVENOUS | Status: DC

## 2021-08-01 MED ORDER — MONTELUKAST SODIUM 10 MG PO TABS
10.0000 mg | ORAL_TABLET | Freq: Once | ORAL | Status: AC
  Administered 2021-08-01: 10 mg via ORAL
  Filled 2021-08-01: qty 1

## 2021-08-01 MED ORDER — HEPARIN SOD (PORK) LOCK FLUSH 100 UNIT/ML IV SOLN
INTRAVENOUS | Status: AC
  Administered 2021-08-01: 500 [IU]
  Filled 2021-08-01: qty 5

## 2021-08-01 MED ORDER — DEXAMETHASONE 4 MG PO TABS
20.0000 mg | ORAL_TABLET | Freq: Once | ORAL | Status: AC
  Administered 2021-08-01: 20 mg via ORAL
  Filled 2021-08-01: qty 5

## 2021-08-01 MED ORDER — PROMETHAZINE HCL 25 MG PO TABS: 25.0000 mg | ORAL_TABLET | Freq: Four times a day (QID) | ORAL | 1 refills | Status: DC | PRN

## 2021-08-01 MED ORDER — SODIUM CHLORIDE 0.9 % IV SOLN
25.0000 mg | Freq: Once | INTRAVENOUS | Status: AC
  Administered 2021-08-01: 25 mg via INTRAVENOUS
  Filled 2021-08-01: qty 1

## 2021-08-01 MED ORDER — MAGNESIUM SULFATE 2 GM/50ML IV SOLN
2.0000 g | Freq: Once | INTRAVENOUS | Status: AC
  Administered 2021-08-01: 2 g via INTRAVENOUS
  Filled 2021-08-01: qty 50

## 2021-08-01 MED ORDER — SODIUM CHLORIDE 0.9% FLUSH
10.0000 mL | Freq: Once | INTRAVENOUS | Status: AC
  Administered 2021-08-01: 10 mL via INTRAVENOUS
  Filled 2021-08-01: qty 10

## 2021-08-01 MED ORDER — SODIUM CHLORIDE 0.9 % IV SOLN
Freq: Once | INTRAVENOUS | Status: AC
  Filled 2021-08-01: qty 250

## 2021-08-01 NOTE — Patient Instructions (Signed)
[facility] AT [place]  Discharge Instructions: Thank you for choosing [facility] to provide your oncology and hematology care.  If you have a lab appointment with the [facility], please go directly to the [facility] and check in at the registration area.  Wear comfortable clothing and clothing appropriate for easy access to any Portacath or PICC line.   We strive to give you quality time with your provider. You may need to reschedule your appointment if you arrive late (15 or more minutes).  Arriving late affects you and other patients whose appointments are after yours.  Also, if you miss three or more appointments without notifying the office, you may be dismissed from the clinic at the providers discretion.      For prescription refill requests, have your pharmacy contact our office and allow 72 hours for refills to be completed.    Today you received the following chemotherapy and/or immunotherapy agents Darzalex      To help prevent nausea and vomiting after your treatment, we encourage you to take your nausea medication as directed.  BELOW ARE SYMPTOMS THAT SHOULD BE REPORTED IMMEDIATELY: *FEVER GREATER THAN 100.4 F (38 C) OR HIGHER *CHILLS OR SWEATING *NAUSEA AND VOMITING THAT IS NOT CONTROLLED WITH YOUR NAUSEA MEDICATION *UNUSUAL SHORTNESS OF BREATH *UNUSUAL BRUISING OR BLEEDING *URINARY PROBLEMS (pain or burning when urinating, or frequent urination) *BOWEL PROBLEMS (unusual diarrhea, constipation, pain near the anus) TENDERNESS IN MOUTH AND THROAT WITH OR WITHOUT PRESENCE OF ULCERS (sore throat, sores in mouth, or a toothache) UNUSUAL RASH, SWELLING OR PAIN  UNUSUAL VAGINAL DISCHARGE OR ITCHING   Items with * indicate a potential emergency and should be followed up as soon as possible or go to the Emergency Department if any problems should occur.  Please show the CHEMOTHERAPY ALERT CARD or IMMUNOTHERAPY ALERT CARD at check-in to  the Emergency Department and triage nurse.  Should you have questions after your visit or need to cancel or reschedule your appointment, please contact [facility] [place] [facility] [place]  [phone] and follow the prompts.  Office hours are 8:00 a.m. to 4:30 p.m. Monday - Friday. Please note that voicemails left after 4:00 p.m. may not be returned until the following business day.  We are closed weekends and major holidays. You have access to a nurse at all times for urgent questions. Please call the main number to the clinic [phone] and follow the prompts.  For any non-urgent questions, you may also contact your provider using MyChart. We now offer e-Visits for anyone 59 and older to request care online for non-urgent symptoms. For details visit [email or web address].   Also download the MyChart app! Go to the app store, search "MyChart", open the app, select [facility], and log in with your MyChart username and password.  Due to Covid, a mask is required upon entering the hospital/clinic. If you do not have a mask, one will be given to you upon arrival. For doctor visits, patients may have 1 support person aged 74 or older with them. For treatment visits, patients cannot have anyone with them due to current Covid guidelines and our immunocompromised population.    Hypomagnesemia Hypomagnesemia is a condition in which the level of magnesium in the blood is too low. Magnesium is a mineral that is found in many foods. It is used in many different processes in the body. Hypomagnesemia can affect every organ in the body. In severe cases, it can cause life-threatening problems. What  are the causes? This condition may be caused by: Not getting enough magnesium in your diet or not having enough healthy foods to eat (malnutrition). Problems with magnesium absorption in the intestines. Dehydration. Excessive use of alcohol. Vomiting. Severe or long-term (chronic) diarrhea. Some  medicines, including medicines that make you urinate more often (diuretics). Certain diseases, such as kidney disease, diabetes, celiac disease, and overactive thyroid. What are the signs or symptoms? Symptoms of this condition include: Loss of appetite, nausea, and vomiting. Involuntary shaking or trembling of a body part (tremor). Muscle weakness or tingling in the arms and legs. Sudden tightening of muscles (muscle spasms). Confusion. Psychiatric issues, such as: Depression and irritability. Psychosis. A feeling of fluttering of the heart (palpitations). Seizures. These symptoms are more severe if magnesium levels drop suddenly. How is this diagnosed? This condition may be diagnosed based on: Your symptoms and medical history. A physical exam. Blood and urine tests. How is this treated? Treatment depends on the cause and the severity of the condition. It may be treated by: Taking a magnesium supplement. This can be taken in pill form. If the condition is severe, magnesium is usually given through an IV. Making changes to your diet. You may be directed to eat foods that have a lot of magnesium, such as green leafy vegetables, peas, beans, and nuts. Not drinking alcohol. If you are struggling not to drink, ask your health care provider for help. Follow these instructions at home: Eating and drinking   Make sure that your diet includes foods with magnesium. Foods that have a lot of magnesium in them include: Green leafy vegetables, such as spinach and broccoli. Beans and peas. Nuts and seeds, such as almonds and sunflower seeds. Whole grains, such as whole grain bread and fortified cereals. Drink fluids that contain salts and minerals (electrolytes), such as sports drinks, when you are active. Do not drink alcohol. General instructions Take over-the-counter and prescription medicines only as told by your health care provider. Take magnesium supplements as directed if your health  care provider tells you to take them. Have your magnesium levels monitored as told by your health care provider. Keep all follow-up visits. This is important. Contact a health care provider if: You get worse instead of better. Your symptoms return. Get help right away if: You develop severe muscle weakness. You have trouble breathing. You feel that your heart is racing. These symptoms may represent a serious problem that is an emergency. Do not wait to see if the symptoms will go away. Get medical help right away. Call your local emergency services (911 in the [place] Do not drive yourself to the hospital. Summary Hypomagnesemia is a condition in which the level of magnesium in the blood is too low. Hypomagnesemia can affect every organ in the body. Treatment may include eating more foods that contain magnesium, taking magnesium supplements, and not drinking alcohol. Have your magnesium levels monitored as told by your health care provider. This information is not intended to replace advice given to you by your health care provider. Make sure you discuss any questions you have with your health care provider. Document Revised: 11/14/2020 Document Reviewed: 11/14/2020 Elsevier Patient Education  [place].

## 2021-08-01 NOTE — Progress Notes (Unsigned)
Per Estephany Perot CMA per Dr. Tasia Catchings pt to be monitored 30 minutes post Darzalex injection.   Per Elmon Else RPH due to unknown compatibility with Dazrlex Faspro and Magnesium, Pause Magnesium during Dazalex injection and 30 minute observation period.  1140: Pt tolerated Darzalex well, no s/s of complications or distress.   1610: Pt tolerated treatment well. Injection site WNL, no redness or swelling noted. VS stable no s/s of distress noted. Pt stable at discharge.

## 2021-08-01 NOTE — Progress Notes (Addendum)
Hematology/Oncology [facility] note  [phone] [phone]     Clinic Day:  08/01/2021   Referring physician: Glean Hess, MD  Chief Complaint: Sarah [name] is a 66 y.o. 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   Initial bone marrow revealed 80% plasma cells.  Lambda free light chains were 1340.  She had nephrotic range proteinuria.  She initially underwent induction with RVD.  Revlimid maintenance was discontinued on 01/21/2017 secondary to intolerance.     07/19/2019 Bone marrow aspirate and biopsy on  revealed a normocellular marrow with but increased lambda-restricted plasma cells (9% aspirate, 40% CD138 immunohistochemistry).  Findings were consistent with recurrent plasma cell myeloma.  Flow cytometry revealed no monoclonal B-cell or phenotypically aberrant T-cell population. Cytogenetics were 46, XX (normal).  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    Lambda light chains have been followed: 22.2 (ratio 0.56) on 07/03/2017, 30.8 (ratio 0.78) on 09/02/2017, 36.9 (ratio 0.40) on 10/21/2017, 37.4 (ratio 0.41) on 12/16/2017, 70.7(ratio 0.31)  on 02/17/2018, 64.2 (ratio 0.27) on 04/07/2018, 78.9 (ratio 0.18) on 05/26/2018, 128.8 (ratio 0.17) on 08/06/2018, 181.5 (ratio 0.13) on 10/08/2018, 130.9 (ratio 0.13) on 10/20/2018, 160.7 (ratio 0.10)  on 12/09/2018, 236.6 (ratio 0.07) on 02/01/2019, 363.6 (ratio 0.04) on 03/22/2019, 404.8 (ratio 0.04) on 04/05/2019, 420.7 (ratio 0.03) on 05/24/2019, 573.4 (ratio 0.03) on 06/23/2019, 451.05 (ratio 0.02) on 08/20/2019, 47.2 (ratio 0.13) on 10/25/2019, 22.4 (ratio 0.21) on 11/25/2019, 16.5 (ratio 0.33) on 01/10/2020, 14.6 (ratio 0.34) on 02/07/2020, 13.1 (ratio 0.31) on 03/07/2020, 10.1 (ratio 0.38) on 04/10/2020, and 9.5 (ratio 0.21) on 06/19/2020.   24 hour UPEP on 06/03/2019 revealed kappa free light chains 95.76, lambda free light chains 1,260.71, and ratio 0.08.  24 hour UPEP on 08/23/2019 revealed total protein of 782 mg/24 hrs with lambda free light chains 1,084.16 mg/L and ratio of 0.10 (1.03-31.76).  M spike in urine was 46.1% (361 mg/24 hrs).    Bone survey on 04/08/2016 and 05/28/2017 revealed no definite lytic lesion seen in the visualized skeleton.  Bone survey on 11/19/2018 revealed no suspicious lucent lesions and no acute bony abnormality.  PET scan on 07/12/2019 revealed no focal metabolic activity to suggest active myeloma within the skeleton. There were no lytic lesions identified on the CT portion of the exam or soft tissue plasmacytomas. There was no evidence of multiple myeloma.    Pretreatment RBC phenotype on 09/23/2019 was positive for C, e, DUFFY B, KIDD B, M, S, and s antigen; negative for c, E, KELL, DUFFY A, KIDD A, and N antigen.    09/27/2019 - 10/25/2019; 12/09/2019 - 03/13/2020 6 cycles of daratumumab and hyaluronidase-fihj, Pomalyst, and Decadron (DPd) .  Cycle #1 was complicated by fever and neutropenia requiring admission.  Cycle #2 was complicated by pneumonia requiring admission.  Cycle #6 was complicated with an ER evaluation for an elevated lactic acid.   05/10/2020 second autologous stem cell transplant at [facility]  She underwent conditioning with melphalan 140 mg/m2.    She is s/p week #3 Velcade maintenance (began 08/31/2020 - 09/28/2020).  She has no increase in  neuropathy.  She has severe aortic stenosis.  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 Cardiology is following for a possible TAVR in the future.  Echo on 07/05/2020 revealed moderate to severe aortic stenosis with an EF of 50-55%.  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 Zometa was discontinued in 01/2017.  She has chronic nausea on Phenergan.     B12 deficiency.  B12 was 254 on 04/09/2017, 295 on 08/20/2018, and 391 on 10/08/2018.  She was on oral B12.  She received B12 monthly (last 09/14/2020).  Folate was 12.1 on 01/10/2020.    She has iron deficiency.  Ferritin was 32 on 07/01/2019.  She received Venofer on 07/15/2019 and 07/22/2019.   She has hypogammaglobulinemia.  IgG was 245 on 11/25/2019.  She received monthly IVIG (07/22/021 - 03/22/2020).  She received IVIG 400 mg/kg on 01/20/2020, 200 mg/kg on 02/17/2020, and 300 mg/kg on 03/22/2020.  IVIG on 72/03/4708 was complicated by acute renal failure. IgG trough level was 418 on 02/17/2020.  10/15/2019 - 10/20/2019 She was admitted to [facility] with fever and neutropenia.  Cultures were negative.  CXR was negative. She received broad spectrum antibiotics and daily Granix.  She received IVF for acute renal insufficiency due to diarrhea and dehydration.  Creatine was 1.66 on admission and 1.12 on discharge.    03/01/2020 - 03/05/2020 admitted to [facility] from with fever and neutropenia. CXR revealed no active cardiopulmonary disease. Chest CT with contrast revealed no acute intrathoracic pathology. There were findings which could be suggestive of prior granulomatous disease. She was treated with Cefepime and Vancomycin, then switched to ciprofloxacin on 03/03/2020.   05/08/2020 - 12/05/2021The patient was admitted to [facility] from  for autologous bone marrow transplant.   She received conditioning with melphalan 140 mg/m2.  Bone marrow transplant was on  05/10/2020. The patient developed fevers daily from 05/19/2020 - 05/24/2020. Blood cultures were + for strept sanguis bacteremia.  She was treated cefepime, vancomycin, and solumedrol for possible engraftment syndrome. Cefepime was switched to ceftriaxone; she completed 2 weeks of ceftriaxone on 06/03/2020. She had chemotherapy induced diarrhea.  She experienced urinary retention.  Feb 2022 patient has been on maintenance Velcade 2 mg every 2 weeks.  Chronic diarrhea seen by gastroenterology Dr. Vicente Males and was recommended to avoid artificial sugars in her diet including Diet Coke and coffee mate.  She feels some improvement of her diarrhea frequency since the dietary modification.  GI profile PCR negative, C. difficile toxin A+ B negative, fecal calprotectin 77 12/19/2020 colonoscopy showed 2 subcentimeter polyps in the ascending colon, resected and removed.  Pathology showed tubular adenoma.  No malignancy.  Normal mucosa in the entire examined colon.  Biopsied, pathology showed colonic mucosa with no significant pathological alteration.  Negative for active inflammation and features of chronicity.  Negative for microscopic colitis, dysplasia, and malignanc  Diabetes, patient has discontinued metformin due to diarrhea and started on glipizide 2.5 mg   03/05/2021-03/09/2021 Patient was hospitalized due to fever and chills, nonproductive cough, shortness of breath. X-ray showed right lower lobe atelectasis versus pneumonia.  Blood cultures negative.  Urine culture showed 60,000 colonies of Enterococcus facialis.  Patient received antibiotics. Patient also had abdominal pelvis CT scan for work-up of intermittent lower abdomen pain.No  acute abnormality.   05/14/21 last dose of Velcade maintenance.  establish care with neurology Dr. Gurney Maxin and her neuropathy regimen has been switched from gabapentin to Lyrica. 05/29/2021 patient got influenza   INTERVAL HISTORY Sarah [name] is a 66 y.o. female  who has above history reviewed by me today presents for follow up visit for management of multiple myeloma.  Her neuropathy medication has been switched from gabapentin/Lyrica to nortriptyline. She needs to have elective steroid injection to her back. Diarrhea has improved significantly since the discontinuation of Velcade. Loose bowel movement every other day. She has tolerated Daratumumab very well. She continues to have intermittent nausea and request phenergan to be refilled.  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   Due to Zometa   Cardiomyopathy, idiopathic ([place]) 08/21/2017   Overview:  Septal and apical hypokinesis with ef 40%   CHF (congestive heart failure) (HCC)    CKD (chronic kidney disease) stage 3, GFR 30-59 ml/min (HCC)    Depression    Diabetes mellitus (HCC)    Dizziness    Fatty liver    Frequent falls    GERD (gastroesophageal reflux disease)    Gout    Heart murmur    History of blood transfusion    History of bone marrow transplant ([place])    History of uterine fibroid    Hx of cardiac catheterization 06/05/2016   Overview:  Normal coronaries 2017   Hypertension    Hypomagnesemia    IDA (iron deficiency anemia)    Multiple myeloma ([place])    Personal history of chemotherapy    Renal cyst    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[place]) 06/05/2016   Overview:  Sp ablation 1999    Past Surgical History:  Procedure Laterality Date   ABDOMINAL HYSTERECTOMY     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   CARDIAC ELECTROPHYSIOLOGY MAPPING AND ABLATION     CARPAL TUNNEL RELEASE Bilateral    CHOLECYSTECTOMY  2008   COLONOSCOPY WITH PROPOFOL N/A 05/07/2017   Procedure: COLONOSCOPY WITH PROPOFOL;  Surgeon: [name], MD;  Location: [facility] ENDOSCOPY;  Service:  Gastroenterology;  Laterality: N/A;   COLONOSCOPY WITH PROPOFOL N/A 12/19/2020   Procedure: COLONOSCOPY WITH PROPOFOL;  Surgeon: [name], MD;  Location: [facility] ENDOSCOPY;  Service: Gastroenterology;  Laterality: N/A;   ESOPHAGOGASTRODUODENOSCOPY (EGD) WITH PROPOFOL N/A 05/07/2017   Procedure: ESOPHAGOGASTRODUODENOSCOPY (EGD) WITH PROPOFOL;  Surgeon: [name], MD;  Location: [facility] ENDOSCOPY;  Service: Gastroenterology;  Laterality: N/A;   FOOT SURGERY Bilateral    INCONTINENCE SURGERY  2009   INTERSTIM IMPLANT PLACEMENT     other     over active bladder   OTHER SURGICAL HISTORY     bladder stimulator    PARTIAL HYSTERECTOMY  03/1996   fibroids   PORTA CATH INSERTION N/A 03/10/2019   Procedure: PORTA CATH INSERTION;  Surgeon: Algernon Huxley, MD;  Location: [place] CV LAB;  Service: Cardiovascular;  Laterality: N/A;   TONSILLECTOMY  2007    Family History  Problem Relation Age of Onset   Colon cancer Father    Renal Disease Father    Diabetes Mellitus II Father    Melanoma Paternal Grandmother    Breast cancer Maternal Aunt 102   Anemia Mother  Heart disease Mother    Heart failure Mother    Renal Disease Mother    Congestive Heart Failure Mother    Heart disease Maternal Uncle    Throat cancer Maternal Uncle    Lung cancer Maternal Uncle    Liver disease Maternal Uncle    Heart failure Maternal Uncle    Hearing loss Son 84       Suicide     Social History:  reports that she quit smoking about 30 years ago. Her smoking use included cigarettes. She has a 20.00 pack-year smoking history. She has never used smokeless tobacco. She reports current alcohol use. She reports that she does not use drugs.  She is on disability. She notes exposure to perchloroethylene [facility]).   Allergies:  Allergies  Allergen Reactions   Oxycodone-Acetaminophen Anaphylaxis    Swelling and rash   Celebrex [Celecoxib] Diarrhea   Codeine    Plerixafor     In 2016 during ASCT collection patient  developed fever to 103.98F and required hospitalization   Benadryl [Diphenhydramine] Palpitations   Morphine Itching and Rash   Ondansetron Diarrhea   Tylenol [Acetaminophen] Itching and Rash    Current Medications: Current Outpatient Medications  Medication Sig Dispense Refill   acetaminophen (TYLENOL) 500 MG tablet Take 500 mg by mouth every 6 (six) hours as needed.     acyclovir (ZOVIRAX) 400 MG tablet Take 1 tablet (400 mg total) by mouth 2 (two) times daily. 180 tablet 1   albuterol (VENTOLIN HFA) 108 (90 Base) MCG/ACT inhaler Inhale 2 puffs into the lungs every 6 (six) hours as needed for wheezing or shortness of breath. 8 g 0   bisoprolol (ZEBETA) 5 MG tablet Take 5 mg by mouth daily.     FLUoxetine (PROZAC) 40 MG capsule TAKE 1 CAPSULE EVERY DAY 90 capsule 0   glucose blood test strip      montelukast (SINGULAIR) 10 MG tablet Take 1 tablet (10 mg total) by mouth See admin instructions. Take 1 tab daily for 2 days after daratumumab treatments 30 tablet 1   Multiple Vitamin (MULTIVITAMIN) tablet Take 1 tablet by mouth daily.     nortriptyline (PAMELOR) 10 MG capsule Take 10 mg at night for a week then increase to 20 mg at night for a week then increase to 30 mg at night and continue that dose     omeprazole (PRILOSEC) 40 MG capsule TAKE 1 CAPSULE (40 MG TOTAL) BY MOUTH IN THE MORNING AND AT BEDTIME. 180 capsule 1   traZODone (DESYREL) 100 MG tablet TAKE 1 TABLET AT BEDTIME 90 tablet 0   allopurinol (ZYLOPRIM) 100 MG tablet TAKE 1 TABLET(100 MG) BY MOUTH DAILY as needed (Patient not taking: Reported on 07/04/2021) 90 tablet 1   diclofenac sodium (VOLTAREN) 1 % GEL Apply 2 g topically 4 (four) times daily. (Patient not taking: Reported on 07/04/2021) 100 g 1   fluticasone (FLONASE) 50 MCG/ACT nasal spray Place 2 sprays into both nostrils daily. (Patient not taking: Reported on 07/04/2021) 16 g 2   Multiple Vitamins-Minerals (HAIR/SKIN/NAILS) TABS Take 1 tablet by mouth daily. (Patient not  taking: Reported on 08/01/2021)     promethazine (PHENERGAN) 25 MG tablet Take 1 tablet (25 mg total) by mouth every 6 (six) hours as needed for nausea or vomiting. 90 tablet 1   promethazine-dextromethorphan (PROMETHAZINE-DM) 6.25-15 MG/5ML syrup Take 5 mLs by mouth 4 (four) times daily as needed for cough. (Patient not taking: Reported on 07/04/2021) 180 mL 0  sucralfate (CARAFATE) 1 g tablet Take 1 tablet (1 g total) by mouth 4 (four) times daily -  with meals and at bedtime. (Patient not taking: Reported on 08/01/2021) 120 tablet 2   tiZANidine (ZANAFLEX) 2 MG tablet Take 1 tablet (2 mg total) by mouth every 6 (six) hours as needed for muscle spasms. (Patient not taking: Reported on 08/01/2021) 30 tablet 1   No current facility-administered medications for this visit.   Facility-Administered Medications Ordered in Other Visits  Medication Dose Route Frequency Provider Last Rate Last Admin   0.9 %  sodium chloride infusion   Intravenous Once Earlie Server, MD       magnesium sulfate IVPB 2 g 50 mL  2 g Intravenous Once Earlie Server, MD 50 mL/hr at 08/01/21 1445 2 g at 08/01/21 1445   sodium chloride flush (NS) 0.9 % injection 10 mL  10 mL Intravenous PRN Earlie Server, MD   10 mL at 04/02/21 7253    Review of Systems  Constitutional:  Negative for chills, fever, malaise/fatigue and weight loss.  HENT:  Negative for sore throat.   Eyes:  Negative for redness.  Respiratory:  Negative for cough, shortness of breath and wheezing.   Cardiovascular:  Negative for chest pain, palpitations and leg swelling.  Gastrointestinal:  Positive for diarrhea and nausea. Negative for abdominal pain, blood in stool and vomiting.  Genitourinary:  Negative for dysuria.  Musculoskeletal:  Positive for back pain and joint pain. Negative for myalgias.  Skin:  Negative for rash.  Neurological:  Positive for tingling and sensory change. Negative for dizziness and tremors.  Endo/Heme/Allergies:  Does not bruise/bleed easily.   Psychiatric/Behavioral:  Negative for hallucinations.    Performance status (ECOG): 1  Vitals Blood pressure 129/83, pulse 69, temperature (!) 97.1 F (36.2 C), resp. rate 18, weight 160 lb 6.4 oz (72.8 kg).  Physical Exam Vitals and nursing note reviewed.  Constitutional:      General: She is not in acute distress.    Appearance: She is well-developed. She is not ill-appearing, toxic-appearing or diaphoretic.     Interventions: Face mask in place.  HENT:     Head: Normocephalic and atraumatic.     Mouth/Throat:     Mouth: Mucous membranes are moist.     Pharynx: Oropharynx is clear.  Eyes:     General: No scleral icterus.    Pupils: Pupils are equal, round, and reactive to light.  Cardiovascular:     Rate and Rhythm: Normal rate and regular rhythm.     Heart sounds: Normal heart sounds. No murmur heard. Pulmonary:     Effort: Pulmonary effort is normal. No respiratory distress.     Breath sounds: Normal breath sounds. No wheezing or rales.  Chest:     Chest wall: No tenderness.  Abdominal:     General: Bowel sounds are normal. There is no distension.     Palpations: Abdomen is soft. There is no hepatomegaly, splenomegaly or mass.     Tenderness: There is no abdominal tenderness. There is no guarding or rebound.  Musculoskeletal:        General: No tenderness. Normal range of motion.     Cervical back: Normal range of motion and neck supple.     Right lower leg: No edema.     Left lower leg: No edema.  Lymphadenopathy:     Head:     Right side of head: No preauricular, posterior auricular or occipital adenopathy.     Left  side of head: No preauricular, posterior auricular or occipital adenopathy.     Cervical: No cervical adenopathy.     Upper Body:     Right upper body: No supraclavicular adenopathy.     Left upper body: No supraclavicular adenopathy.  Skin:    General: Skin is warm and dry.  Neurological:     Mental Status: She is alert and oriented to person,  place, and time. Mental status is at baseline.  Psychiatric:        Mood and Affect: Mood normal.   Laboratory data Infusion on 08/01/2021  Component Date Value Ref Range Status   Magnesium 08/01/2021 1.1 (L)  1.7 - 2.4 mg/dL Final   Performed at [facility], [address] [place], [place] [address]   WBC 08/01/2021 5.2  4.0 - 10.5 K/uL Final   RBC 08/01/2021 4.39  3.87 - 5.11 MIL/uL Final   Hemoglobin 08/01/2021 11.9 (L)  12.0 - 15.0 g/dL Final   HCT 08/01/2021 36.0  36.0 - 46.0 % Final   MCV 08/01/2021 82.0  80.0 - 100.0 fL Final   MCH 08/01/2021 27.1  26.0 - 34.0 pg Final   MCHC 08/01/2021 33.1  30.0 - 36.0 g/dL Final   RDW 08/01/2021 14.4  11.5 - 15.5 % Final   Platelets 08/01/2021 113 (L)  150 - 400 K/uL Final   nRBC 08/01/2021 0.0  0.0 - 0.2 % Final   Neutrophils Relative % 08/01/2021 67  % Final   Neutro Abs 08/01/2021 3.5  1.7 - 7.7 K/uL Final   Lymphocytes Relative 08/01/2021 20  % Final   Lymphs Abs 08/01/2021 1.0  0.7 - 4.0 K/uL Final   Monocytes Relative 08/01/2021 8  % Final   Monocytes Absolute 08/01/2021 0.4  0.1 - 1.0 K/uL Final   Eosinophils Relative 08/01/2021 4  % Final   Eosinophils Absolute 08/01/2021 0.2  0.0 - 0.5 K/uL Final   Basophils Relative 08/01/2021 1  % Final   Basophils Absolute 08/01/2021 0.0  0.0 - 0.1 K/uL Final   Immature Granulocytes 08/01/2021 0  % Final   Abs Immature Granulocytes 08/01/2021 0.01  0.00 - 0.07 K/uL Final   Performed at [facility], [place], [place] [address]   Sodium 08/01/2021 135  135 - 145 mmol/L Final   Potassium 08/01/2021 3.9  3.5 - 5.1 mmol/L Final   Chloride 08/01/2021 105  98 - 111 mmol/L Final   CO2 08/01/2021 22  22 - 32 mmol/L Final   Glucose, Bld 08/01/2021 161 (H)  70 - 99 mg/dL Final   Glucose reference range applies only to samples taken after fasting for at least 8 hours.   BUN 08/01/2021 23  8 - 23 mg/dL Final   Creatinine, Ser 08/01/2021 1.09 (H)  0.44 - 1.00 mg/dL Final    Calcium 08/01/2021 9.1  8.9 - 10.3 mg/dL Final   Total Protein 08/01/2021 7.0  6.5 - 8.1 g/dL Final   Albumin 08/01/2021 4.4  3.5 - 5.0 g/dL Final   AST 08/01/2021 53 (H)  15 - 41 U/L Final   ALT 08/01/2021 62 (H)  0 - 44 U/L Final   Alkaline Phosphatase 08/01/2021 90  38 - 126 U/L Final   Total Bilirubin 08/01/2021 0.5  0.3 - 1.2 mg/dL Final   GFR, Estimated 08/01/2021 57 (L)  >60 mL/min Final   Comment: (NOTE) Calculated using the CKD-EPI Creatinine Equation (2021)    Anion gap 08/01/2021 8  5 -  15 Final   Performed at [facility], [address] [place], [place] [address]    Assessment/Plan 1. Encounter for antineoplastic chemotherapy   2. 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 Hypomagnesemia   5. 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[place])   6. Chronic nausea     # Recurrent lambda light chain multiple myeloma s/p second autologous bone marrow transplant [05/10/2020] Most recent Multiple myeloma showed negative M protein.  Light chain ratio is normal. 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  Attention on follow-up. Currently Daratumumab injections every 4 weeks.   She tolerates well.   Labs are reviewed and discussed with the patient.  Proceed with Daratumumab injection.Dexamethasone 19m x 1.  M protein/light chain ratio levels have been stable and normal.  Continue prophylaxis with acyclovir  [facility] [phone].  #Hyperglobulinemia post stem cell transplantation.  May consider  IVIG replacement therapy if IgG is persistently low and if she develops frequent infections. .-Labs reviewed with patient.  Most recent IgG levels are normal.  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   Follows up with neurology.  Now on  nortriptyline.  #chronic diarrhea, Diarrhea has improved.  #Chronic hypomagnesemia Magnesium is 1.1 today, proceed with  IV magnesium today. 6 g Continue weekly magnesium +/- IV magnesium.   I suggest patient to try to resume Slow-Mag to see if she is able to tolerate now since Velcade has discontinued and diarrhea has improved.  #Dysphagia, chronic nausea, patient request to have IV Phenergan in the clinic with treatment.    # B12 deficiency, patient has been on vitamin B12 monthly. Last dose 11/09/2020. Vitamin B12 is 800s.  We will hold off B12 injections.  Continue monitor levels.   #Kidney function slightly worse today her baseline.  Encourage oral hydration.   RTC: weekly  lab magnesium/IV magnesium weekly x  3 RTC in 4 weeks for labs (CBC with diff, CMP, Mg ), md  for Daratumumab and IV Mg   She may proceed with epidural steroid injection.  I discussed the assessment and treatment plan with the patient.  The patient was provided an opportunity to ask questions and all were answered.  The patient agreed with the plan and demonstrated an understanding of the instructions.  The patient was advised to call back if the symptoms worsen or if the condition fails to improve as anticipated.   ZEarlie Server MD, PhD 08/01/2021

## 2021-08-01 NOTE — Progress Notes (Signed)
Patient here for follow up. Pt reports that she has been switched to Amitriptyline and Lyrica has been discontinued. Patient also reports that she is scheduled for a steroid injection next Friday and pain clinic office will be sending a form over for Dr. Tasia Catchings to sign. We have not received anything as of yet.

## 2021-08-02 LAB — KAPPA/LAMBDA LIGHT CHAINS
Kappa free light chain: 5.9 mg/L (ref 3.3–19.4)
Kappa, lambda light chain ratio: 0.68 (ref 0.26–1.65)
Lambda free light chains: 8.7 mg/L (ref 5.7–26.3)

## 2021-08-06 ENCOUNTER — Inpatient Hospital Stay: Payer: Medicare HMO

## 2021-08-06 LAB — MULTIPLE MYELOMA PANEL, SERUM
Albumin SerPl Elph-Mcnc: 4.3 g/dL (ref 2.9–4.4)
Albumin/Glob SerPl: 1.8 — ABNORMAL HIGH (ref 0.7–1.7)
Alpha 1: 0.2 g/dL (ref 0.0–0.4)
Alpha2 Glob SerPl Elph-Mcnc: 0.9 g/dL (ref 0.4–1.0)
B-Globulin SerPl Elph-Mcnc: 1 g/dL (ref 0.7–1.3)
Gamma Glob SerPl Elph-Mcnc: 0.3 g/dL — ABNORMAL LOW (ref 0.4–1.8)
Globulin, Total: 2.4 g/dL (ref 2.2–3.9)
IgA: 17 mg/dL — ABNORMAL LOW (ref 87–352)
IgG (Immunoglobin G), Serum: 352 mg/dL — ABNORMAL LOW (ref 586–1602)
IgM (Immunoglobulin M), Srm: 15 mg/dL — ABNORMAL LOW (ref 26–217)
M Protein SerPl Elph-Mcnc: 0.1 g/dL — ABNORMAL HIGH
Total Protein ELP: 6.7 g/dL (ref 6.0–8.5)

## 2021-08-08 ENCOUNTER — Inpatient Hospital Stay: Payer: Medicare HMO

## 2021-08-08 ENCOUNTER — Other Ambulatory Visit: Payer: Self-pay

## 2021-08-08 ENCOUNTER — Inpatient Hospital Stay: Payer: Medicare HMO | Attending: Oncology

## 2021-08-08 DIAGNOSIS — Z79899 Other long term (current) drug therapy: Secondary | ICD-10-CM | POA: Insufficient documentation

## 2021-08-08 DIAGNOSIS — G620 Drug-induced polyneuropathy: Secondary | ICD-10-CM | POA: Insufficient documentation

## 2021-08-08 DIAGNOSIS — Z9484 Stem cells transplant status: Secondary | ICD-10-CM | POA: Diagnosis not present

## 2021-08-08 DIAGNOSIS — N390 Urinary tract infection, site not specified: Secondary | ICD-10-CM | POA: Diagnosis not present

## 2021-08-08 DIAGNOSIS — C9000 Multiple myeloma not having achieved remission: Secondary | ICD-10-CM | POA: Diagnosis not present

## 2021-08-08 DIAGNOSIS — A491 Streptococcal infection, unspecified site: Secondary | ICD-10-CM | POA: Diagnosis not present

## 2021-08-08 DIAGNOSIS — R110 Nausea: Secondary | ICD-10-CM | POA: Diagnosis not present

## 2021-08-08 DIAGNOSIS — R1310 Dysphagia, unspecified: Secondary | ICD-10-CM | POA: Diagnosis not present

## 2021-08-08 DIAGNOSIS — C9001 Multiple myeloma in remission: Secondary | ICD-10-CM | POA: Insufficient documentation

## 2021-08-08 DIAGNOSIS — I5032 Chronic diastolic (congestive) heart failure: Secondary | ICD-10-CM | POA: Diagnosis not present

## 2021-08-08 DIAGNOSIS — Z792 Long term (current) use of antibiotics: Secondary | ICD-10-CM | POA: Diagnosis not present

## 2021-08-08 DIAGNOSIS — E1142 Type 2 diabetes mellitus with diabetic polyneuropathy: Secondary | ICD-10-CM | POA: Diagnosis not present

## 2021-08-08 DIAGNOSIS — T451X5A Adverse effect of antineoplastic and immunosuppressive drugs, initial encounter: Secondary | ICD-10-CM | POA: Diagnosis not present

## 2021-08-08 DIAGNOSIS — B999 Unspecified infectious disease: Secondary | ICD-10-CM | POA: Diagnosis not present

## 2021-08-08 DIAGNOSIS — E1122 Type 2 diabetes mellitus with diabetic chronic kidney disease: Secondary | ICD-10-CM | POA: Diagnosis not present

## 2021-08-08 DIAGNOSIS — Z7682 Awaiting organ transplant status: Secondary | ICD-10-CM | POA: Diagnosis not present

## 2021-08-08 LAB — CBC WITH DIFFERENTIAL/PLATELET
Abs Immature Granulocytes: 0.02 10*3/uL (ref 0.00–0.07)
Basophils Absolute: 0 10*3/uL (ref 0.0–0.1)
Basophils Relative: 1 %
Eosinophils Absolute: 0.1 10*3/uL (ref 0.0–0.5)
Eosinophils Relative: 2 %
HCT: 36.9 % (ref 36.0–46.0)
Hemoglobin: 12 g/dL (ref 12.0–15.0)
Immature Granulocytes: 0 %
Lymphocytes Relative: 24 %
Lymphs Abs: 1.8 10*3/uL (ref 0.7–4.0)
MCH: 26.5 pg (ref 26.0–34.0)
MCHC: 32.5 g/dL (ref 30.0–36.0)
MCV: 81.6 fL (ref 80.0–100.0)
Monocytes Absolute: 0.6 10*3/uL (ref 0.1–1.0)
Monocytes Relative: 7 %
Neutro Abs: 5 10*3/uL (ref 1.7–7.7)
Neutrophils Relative %: 66 %
Platelets: 108 10*3/uL — ABNORMAL LOW (ref 150–400)
RBC: 4.52 MIL/uL (ref 3.87–5.11)
RDW: 14.8 % (ref 11.5–15.5)
WBC: 7.6 10*3/uL (ref 4.0–10.5)
nRBC: 0 % (ref 0.0–0.2)

## 2021-08-08 LAB — MAGNESIUM: Magnesium: 1.4 mg/dL — ABNORMAL LOW (ref 1.7–2.4)

## 2021-08-08 MED ORDER — SODIUM CHLORIDE 0.9% FLUSH
10.0000 mL | Freq: Once | INTRAVENOUS | Status: AC
  Administered 2021-08-08: 10 mL via INTRAVENOUS
  Filled 2021-08-08: qty 10

## 2021-08-08 MED ORDER — SODIUM CHLORIDE 0.9 % IV SOLN
25.0000 mg | Freq: Once | INTRAVENOUS | Status: AC
  Administered 2021-08-08: 25 mg via INTRAVENOUS
  Filled 2021-08-08: qty 1

## 2021-08-08 MED ORDER — MAGNESIUM SULFATE 4 GM/100ML IV SOLN
4.0000 g | Freq: Once | INTRAVENOUS | Status: AC
  Administered 2021-08-08: 4 g via INTRAVENOUS
  Filled 2021-08-08: qty 100

## 2021-08-08 MED ORDER — SODIUM CHLORIDE 0.9 % IV SOLN
INTRAVENOUS | Status: DC
  Filled 2021-08-08: qty 250

## 2021-08-08 MED ORDER — SODIUM CHLORIDE 0.9 % IV SOLN: 4.0000 g | Freq: Once | INTRAVENOUS | Status: DC

## 2021-08-08 MED ORDER — HEPARIN SOD (PORK) LOCK FLUSH 100 UNIT/ML IV SOLN
500.0000 [IU] | Freq: Once | INTRAVENOUS | Status: AC
  Administered 2021-08-08: 500 [IU] via INTRAVENOUS
  Filled 2021-08-08: qty 5

## 2021-08-08 NOTE — Patient Instructions (Signed)
Magnesium Sulfate Injection What is this medication? MAGNESIUM SULFATE (mag NEE zee um SUL fate) prevents and treats low levels of magnesium in your body. It may also be used to prevent and treat seizures during pregnancy in people with high blood pressure disorders, such as preeclampsia or eclampsia. Magnesium plays an important role in maintaining the health of your muscles and nervous system. This medicine may be used for other purposes; ask your health care provider or pharmacist if you have questions. What should I tell my care team before I take this medication? They need to know if you have any of these conditions: Heart disease History of irregular heart beat Kidney disease An unusual or allergic reaction to magnesium sulfate, medications, foods, dyes, or preservatives Pregnant or trying to get pregnant Breast-feeding How should I use this medication? This medication is for infusion into a vein. It is given in a hospital or clinic setting. Talk to your care team about the use of this medication in children. While this medication may be prescribed for selected conditions, precautions do apply. Overdosage: If you think you have taken too much of this medicine contact a poison control center or emergency room at once. NOTE: This medicine is only for you. Do not share this medicine with others. What if I miss a dose? This does not apply. What may interact with this medication? Certain medications for anxiety or sleep Certain medications for seizures like phenobarbital Digoxin Medications that relax muscles for surgery Narcotic medications for pain This list may not describe all possible interactions. Give your health care provider a list of all the medicines, herbs, non-prescription drugs, or dietary supplements you use. Also tell them if you smoke, drink alcohol, or use illegal drugs. Some items may interact with your medicine. What should I watch for while using this medication? Your  condition will be monitored carefully while you are receiving this medication. You may need blood work done while you are receiving this medication. What side effects may I notice from receiving this medication? Side effects that you should report to your care team as soon as possible: Allergic reactions--skin rash, itching, hives, swelling of the face, lips, tongue, or throat High magnesium level--confusion, drowsiness, facial flushing, redness, sweating, muscle weakness, fast or irregular heartbeat, trouble breathing Low blood pressure--dizziness, feeling faint or lightheaded, blurry vision Side effects that usually do not require medical attention (report to your care team if they continue or are bothersome): Headache Nausea This list may not describe all possible side effects. Call your doctor for medical advice about side effects. You may report side effects to FDA at 1-800-FDA-1088. Where should I keep my medication? This medication is given in a hospital or clinic and will not be stored at home. NOTE: This sheet is a summary. It may not cover all possible information. If you have questions about this medicine, talk to your doctor, pharmacist, or health care provider.  2022 Elsevier/Gold Standard (2020-08-31 00:00:00)  

## 2021-08-09 ENCOUNTER — Inpatient Hospital Stay: Payer: Medicare HMO

## 2021-08-09 DIAGNOSIS — M48062 Spinal stenosis, lumbar region with neurogenic claudication: Secondary | ICD-10-CM | POA: Diagnosis not present

## 2021-08-09 DIAGNOSIS — M5416 Radiculopathy, lumbar region: Secondary | ICD-10-CM | POA: Diagnosis not present

## 2021-08-14 ENCOUNTER — Telehealth: Payer: Self-pay | Admitting: *Deleted

## 2021-08-14 NOTE — Telephone Encounter (Signed)
Dr. Fatima Sanger would like Dr. Tasia Catchings to call her regarding this patient's treatment plan. Record from Infectious Disease are in Care Everywhere. Her number is [phone].

## 2021-08-15 ENCOUNTER — Inpatient Hospital Stay: Payer: Medicare HMO

## 2021-08-15 ENCOUNTER — Other Ambulatory Visit: Payer: Self-pay

## 2021-08-15 DIAGNOSIS — C9001 Multiple myeloma in remission: Secondary | ICD-10-CM | POA: Diagnosis not present

## 2021-08-15 DIAGNOSIS — G620 Drug-induced polyneuropathy: Secondary | ICD-10-CM | POA: Diagnosis not present

## 2021-08-15 DIAGNOSIS — R110 Nausea: Secondary | ICD-10-CM | POA: Diagnosis not present

## 2021-08-15 DIAGNOSIS — T451X5A Adverse effect of antineoplastic and immunosuppressive drugs, initial encounter: Secondary | ICD-10-CM | POA: Diagnosis not present

## 2021-08-15 DIAGNOSIS — Z79899 Other long term (current) drug therapy: Secondary | ICD-10-CM | POA: Diagnosis not present

## 2021-08-15 DIAGNOSIS — C9000 Multiple myeloma not having achieved remission: Secondary | ICD-10-CM

## 2021-08-15 LAB — MAGNESIUM: Magnesium: 1.2 mg/dL — ABNORMAL LOW (ref 1.7–2.4)

## 2021-08-15 MED ORDER — SODIUM CHLORIDE 0.9 % IV SOLN
Freq: Once | INTRAVENOUS | Status: AC
  Filled 2021-08-15: qty 250

## 2021-08-15 MED ORDER — SODIUM CHLORIDE 0.9 % IV SOLN
25.0000 mg | Freq: Once | INTRAVENOUS | Status: AC
  Administered 2021-08-15: 25 mg via INTRAVENOUS
  Filled 2021-08-15: qty 1

## 2021-08-15 MED ORDER — MAGNESIUM SULFATE 4 GM/100ML IV SOLN
4.0000 g | Freq: Once | INTRAVENOUS | Status: AC
  Administered 2021-08-15: 4 g via INTRAVENOUS
  Filled 2021-08-15: qty 100

## 2021-08-15 MED ORDER — MAGNESIUM SULFATE 2 GM/50ML IV SOLN
2.0000 g | Freq: Once | INTRAVENOUS | Status: AC
  Administered 2021-08-15: 2 g via INTRAVENOUS
  Filled 2021-08-15: qty 50

## 2021-08-15 MED ORDER — HEPARIN SOD (PORK) LOCK FLUSH 100 UNIT/ML IV SOLN
500.0000 [IU] | Freq: Once | INTRAVENOUS | Status: AC
  Administered 2021-08-15: 500 [IU] via INTRAVENOUS
  Filled 2021-08-15: qty 5

## 2021-08-15 MED ORDER — SODIUM CHLORIDE 0.9 % IV SOLN: 6.0000 g | Freq: Once | INTRAVENOUS | Status: DC

## 2021-08-15 MED ORDER — SODIUM CHLORIDE 0.9% FLUSH
10.0000 mL | Freq: Once | INTRAVENOUS | Status: AC
  Administered 2021-08-15: 10 mL via INTRAVENOUS
  Filled 2021-08-15: qty 10

## 2021-08-15 NOTE — Patient Instructions (Signed)
Magnesium Sulfate Injection What is this medication? MAGNESIUM SULFATE (mag NEE zee um SUL fate) prevents and treats low levels of magnesium in your body. It may also be used to prevent and treat seizures during pregnancy in people with high blood pressure disorders, such as preeclampsia or eclampsia. Magnesium plays an important role in maintaining the health of your muscles and nervous system. This medicine may be used for other purposes; ask your health care provider or pharmacist if you have questions. What should I tell my care team before I take this medication? They need to know if you have any of these conditions: Heart disease History of irregular heart beat Kidney disease An unusual or allergic reaction to magnesium sulfate, medications, foods, dyes, or preservatives Pregnant or trying to get pregnant Breast-feeding How should I use this medication? This medication is for infusion into a vein. It is given in a hospital or clinic setting. Talk to your care team about the use of this medication in children. While this medication may be prescribed for selected conditions, precautions do apply. Overdosage: If you think you have taken too much of this medicine contact a poison control center or emergency room at once. NOTE: This medicine is only for you. Do not share this medicine with others. What if I miss a dose? This does not apply. What may interact with this medication? Certain medications for anxiety or sleep Certain medications for seizures like phenobarbital Digoxin Medications that relax muscles for surgery Narcotic medications for pain This list may not describe all possible interactions. Give your health care provider a list of all the medicines, herbs, non-prescription drugs, or dietary supplements you use. Also tell them if you smoke, drink alcohol, or use illegal drugs. Some items may interact with your medicine. What should I watch for while using this medication? Your  condition will be monitored carefully while you are receiving this medication. You may need blood work done while you are receiving this medication. What side effects may I notice from receiving this medication? Side effects that you should report to your care team as soon as possible: Allergic reactions--skin rash, itching, hives, swelling of the face, lips, tongue, or throat High magnesium level--confusion, drowsiness, facial flushing, redness, sweating, muscle weakness, fast or irregular heartbeat, trouble breathing Low blood pressure--dizziness, feeling faint or lightheaded, blurry vision Side effects that usually do not require medical attention (report to your care team if they continue or are bothersome): Headache Nausea This list may not describe all possible side effects. Call your doctor for medical advice about side effects. You may report side effects to FDA at 1-800-FDA-1088. Where should I keep my medication? This medication is given in a hospital or clinic and will not be stored at home. NOTE: This sheet is a summary. It may not cover all possible information. If you have questions about this medicine, talk to your doctor, pharmacist, or health care provider.  2022 Elsevier/Gold Standard (2020-08-31 00:00:00)  

## 2021-08-15 NOTE — Progress Notes (Signed)
Pt magnesium 1.2. 6 grams of Magnesium given over 3 hours. Pt tolerated well. Discharged to home.

## 2021-08-16 ENCOUNTER — Encounter: Payer: Self-pay | Admitting: Nephrology

## 2021-08-17 ENCOUNTER — Other Ambulatory Visit: Payer: Self-pay | Admitting: Oncology

## 2021-08-17 ENCOUNTER — Ambulatory Visit
Admission: RE | Admit: 2021-08-17 | Discharge: 2021-08-17 | Disposition: A | Discharge status: Home / Self Care | Payer: Medicare HMO | Source: Ambulatory Visit | Attending: Oncology | Admitting: Oncology

## 2021-08-17 ENCOUNTER — Telehealth: Payer: Self-pay | Admitting: Oncology

## 2021-08-17 ENCOUNTER — Telehealth: Payer: Self-pay

## 2021-08-17 ENCOUNTER — Encounter: Payer: Self-pay | Admitting: Oncology

## 2021-08-17 ENCOUNTER — Ambulatory Visit
Admission: RE | Admit: 2021-08-17 | Discharge: 2021-08-17 | Disposition: A | Discharge status: Home / Self Care | Payer: Medicare HMO | Source: Home / Self Care | Attending: Oncology | Admitting: Oncology

## 2021-08-17 DIAGNOSIS — J159 Unspecified bacterial pneumonia: Secondary | ICD-10-CM | POA: Diagnosis not present

## 2021-08-17 DIAGNOSIS — R0600 Dyspnea, unspecified: Secondary | ICD-10-CM | POA: Diagnosis not present

## 2021-08-17 DIAGNOSIS — G620 Drug-induced polyneuropathy: Secondary | ICD-10-CM | POA: Diagnosis present

## 2021-08-17 DIAGNOSIS — Z9481 Bone marrow transplant status: Secondary | ICD-10-CM | POA: Diagnosis not present

## 2021-08-17 DIAGNOSIS — R051 Acute cough: Secondary | ICD-10-CM

## 2021-08-17 DIAGNOSIS — I493 Ventricular premature depolarization: Secondary | ICD-10-CM | POA: Diagnosis not present

## 2021-08-17 DIAGNOSIS — A4189 Other specified sepsis: Secondary | ICD-10-CM | POA: Diagnosis not present

## 2021-08-17 DIAGNOSIS — B9789 Other viral agents as the cause of diseases classified elsewhere: Secondary | ICD-10-CM | POA: Diagnosis not present

## 2021-08-17 DIAGNOSIS — M48062 Spinal stenosis, lumbar region with neurogenic claudication: Secondary | ICD-10-CM | POA: Diagnosis present

## 2021-08-17 DIAGNOSIS — R509 Fever, unspecified: Secondary | ICD-10-CM | POA: Diagnosis not present

## 2021-08-17 DIAGNOSIS — Z03818 Encounter for observation for suspected exposure to other biological agents ruled out: Secondary | ICD-10-CM | POA: Diagnosis not present

## 2021-08-17 DIAGNOSIS — F32A Depression, unspecified: Secondary | ICD-10-CM | POA: Diagnosis present

## 2021-08-17 DIAGNOSIS — I350 Nonrheumatic aortic (valve) stenosis: Secondary | ICD-10-CM | POA: Diagnosis not present

## 2021-08-17 DIAGNOSIS — N390 Urinary tract infection, site not specified: Secondary | ICD-10-CM | POA: Diagnosis not present

## 2021-08-17 DIAGNOSIS — E1122 Type 2 diabetes mellitus with diabetic chronic kidney disease: Secondary | ICD-10-CM | POA: Diagnosis present

## 2021-08-17 DIAGNOSIS — J988 Other specified respiratory disorders: Secondary | ICD-10-CM | POA: Diagnosis not present

## 2021-08-17 DIAGNOSIS — R0602 Shortness of breath: Secondary | ICD-10-CM | POA: Diagnosis not present

## 2021-08-17 DIAGNOSIS — J45909 Unspecified asthma, uncomplicated: Secondary | ICD-10-CM | POA: Diagnosis present

## 2021-08-17 DIAGNOSIS — I214 Non-ST elevation (NSTEMI) myocardial infarction: Secondary | ICD-10-CM | POA: Diagnosis not present

## 2021-08-17 DIAGNOSIS — I130 Hypertensive heart and chronic kidney disease with heart failure and stage 1 through stage 4 chronic kidney disease, or unspecified chronic kidney disease: Secondary | ICD-10-CM | POA: Diagnosis not present

## 2021-08-17 DIAGNOSIS — I5043 Acute on chronic combined systolic (congestive) and diastolic (congestive) heart failure: Secondary | ICD-10-CM | POA: Diagnosis not present

## 2021-08-17 DIAGNOSIS — T451X5A Adverse effect of antineoplastic and immunosuppressive drugs, initial encounter: Secondary | ICD-10-CM | POA: Diagnosis present

## 2021-08-17 DIAGNOSIS — I429 Cardiomyopathy, unspecified: Secondary | ICD-10-CM | POA: Diagnosis not present

## 2021-08-17 DIAGNOSIS — E118 Type 2 diabetes mellitus with unspecified complications: Secondary | ICD-10-CM | POA: Diagnosis not present

## 2021-08-17 DIAGNOSIS — A419 Sepsis, unspecified organism: Secondary | ICD-10-CM | POA: Diagnosis not present

## 2021-08-17 DIAGNOSIS — R768 Other specified abnormal immunological findings in serum: Secondary | ICD-10-CM | POA: Diagnosis not present

## 2021-08-17 DIAGNOSIS — M5134 Other intervertebral disc degeneration, thoracic region: Secondary | ICD-10-CM | POA: Diagnosis not present

## 2021-08-17 DIAGNOSIS — R059 Cough, unspecified: Secondary | ICD-10-CM | POA: Diagnosis not present

## 2021-08-17 DIAGNOSIS — J211 Acute bronchiolitis due to human metapneumovirus: Secondary | ICD-10-CM | POA: Diagnosis present

## 2021-08-17 DIAGNOSIS — C9001 Multiple myeloma in remission: Secondary | ICD-10-CM | POA: Diagnosis not present

## 2021-08-17 DIAGNOSIS — J90 Pleural effusion, not elsewhere classified: Secondary | ICD-10-CM | POA: Diagnosis not present

## 2021-08-17 DIAGNOSIS — Q231 Congenital insufficiency of aortic valve: Secondary | ICD-10-CM | POA: Diagnosis not present

## 2021-08-17 DIAGNOSIS — J40 Bronchitis, not specified as acute or chronic: Secondary | ICD-10-CM | POA: Diagnosis not present

## 2021-08-17 DIAGNOSIS — I447 Left bundle-branch block, unspecified: Secondary | ICD-10-CM | POA: Diagnosis not present

## 2021-08-17 DIAGNOSIS — R000 Tachycardia, unspecified: Secondary | ICD-10-CM | POA: Diagnosis not present

## 2021-08-17 DIAGNOSIS — B9781 Human metapneumovirus as the cause of diseases classified elsewhere: Secondary | ICD-10-CM | POA: Diagnosis not present

## 2021-08-17 DIAGNOSIS — M4722 Other spondylosis with radiculopathy, cervical region: Secondary | ICD-10-CM | POA: Diagnosis present

## 2021-08-17 DIAGNOSIS — I5021 Acute systolic (congestive) heart failure: Secondary | ICD-10-CM | POA: Diagnosis not present

## 2021-08-17 DIAGNOSIS — D696 Thrombocytopenia, unspecified: Secondary | ICD-10-CM | POA: Diagnosis not present

## 2021-08-17 DIAGNOSIS — C9000 Multiple myeloma not having achieved remission: Secondary | ICD-10-CM | POA: Diagnosis not present

## 2021-08-17 DIAGNOSIS — N183 Chronic kidney disease, stage 3 unspecified: Secondary | ICD-10-CM | POA: Diagnosis present

## 2021-08-17 DIAGNOSIS — R079 Chest pain, unspecified: Secondary | ICD-10-CM | POA: Diagnosis not present

## 2021-08-17 DIAGNOSIS — I517 Cardiomegaly: Secondary | ICD-10-CM | POA: Diagnosis not present

## 2021-08-17 DIAGNOSIS — J208 Acute bronchitis due to other specified organisms: Secondary | ICD-10-CM | POA: Diagnosis not present

## 2021-08-17 DIAGNOSIS — B370 Candidal stomatitis: Secondary | ICD-10-CM | POA: Diagnosis not present

## 2021-08-17 DIAGNOSIS — Z9484 Stem cells transplant status: Secondary | ICD-10-CM | POA: Diagnosis not present

## 2021-08-17 DIAGNOSIS — K521 Toxic gastroenteritis and colitis: Secondary | ICD-10-CM | POA: Diagnosis present

## 2021-08-17 DIAGNOSIS — Z20822 Contact with and (suspected) exposure to covid-19: Secondary | ICD-10-CM | POA: Diagnosis not present

## 2021-08-17 DIAGNOSIS — D6959 Other secondary thrombocytopenia: Secondary | ICD-10-CM | POA: Diagnosis present

## 2021-08-17 MED ORDER — FLUCONAZOLE 150 MG PO TABS: 150.0000 mg | ORAL_TABLET | Freq: Once | ORAL | 0 refills | Status: DC | PRN

## 2021-08-17 MED ORDER — AZITHROMYCIN 250 MG PO TABS: ORAL_TABLET | ORAL | 0 refills | Status: DC

## 2021-08-17 NOTE — Telephone Encounter (Signed)
Called patient and reviewed chest x-ray results.  No pneumonia. Patient reports that she has been tested negative for COVID-19, RSV. Her clinical symptoms suggest possible bronchitis.  With her immunocompromise state, I recommend a course of empiric Z-Pak.  Patient agrees with the plan.  Also she requests vaginal candidiasis prophylaxis.  Recommend fluconazole 150 mg once as needed.  Prescriptions were sent to pharmacy.Mar[place] Kitchen

## 2021-08-17 NOTE — Telephone Encounter (Signed)
Please advise 

## 2021-08-17 NOTE — Telephone Encounter (Addendum)
Per Dr. Tasia Catchings: Please schedule patient to get IVIG next week, with pre IVIG hydration 1 hour NS.  Add IVIG *new* with 1 hour NS to appt on 2/22. If no room to add then this can be on D2 (2/23). Dr. Tasia Catchings informed patient of the possibility of this being scheduled on a separate day. please call pt with updated appts.

## 2021-08-17 NOTE — Telephone Encounter (Signed)
Spoke to pt, she is aware of her infusion appt being on D2 and is in agreement with appt.

## 2021-08-18 ENCOUNTER — Emergency Department: Payer: Medicare HMO

## 2021-08-18 ENCOUNTER — Other Ambulatory Visit: Payer: Self-pay

## 2021-08-18 ENCOUNTER — Encounter: Payer: Self-pay | Admitting: Emergency Medicine

## 2021-08-18 ENCOUNTER — Inpatient Hospital Stay
Admission: EM | Admit: 2021-08-18 | Discharge: 2021-08-25 | DRG: 871 | Disposition: A | Discharge status: Home Health | Payer: Medicare HMO | Attending: Internal Medicine | Admitting: Internal Medicine

## 2021-08-18 DIAGNOSIS — Z20822 Contact with and (suspected) exposure to covid-19: Secondary | ICD-10-CM | POA: Diagnosis present

## 2021-08-18 DIAGNOSIS — Z87891 Personal history of nicotine dependence: Secondary | ICD-10-CM

## 2021-08-18 DIAGNOSIS — N390 Urinary tract infection, site not specified: Secondary | ICD-10-CM | POA: Diagnosis present

## 2021-08-18 DIAGNOSIS — C9000 Multiple myeloma not having achieved remission: Secondary | ICD-10-CM | POA: Diagnosis present

## 2021-08-18 DIAGNOSIS — M48062 Spinal stenosis, lumbar region with neurogenic claudication: Secondary | ICD-10-CM | POA: Diagnosis present

## 2021-08-18 DIAGNOSIS — B370 Candidal stomatitis: Secondary | ICD-10-CM | POA: Diagnosis present

## 2021-08-18 DIAGNOSIS — Z808 Family history of malignant neoplasm of other organs or systems: Secondary | ICD-10-CM

## 2021-08-18 DIAGNOSIS — M4722 Other spondylosis with radiculopathy, cervical region: Secondary | ICD-10-CM | POA: Diagnosis present

## 2021-08-18 DIAGNOSIS — I5043 Acute on chronic combined systolic (congestive) and diastolic (congestive) heart failure: Secondary | ICD-10-CM | POA: Diagnosis not present

## 2021-08-18 DIAGNOSIS — Z801 Family history of malignant neoplasm of trachea, bronchus and lung: Secondary | ICD-10-CM

## 2021-08-18 DIAGNOSIS — R0600 Dyspnea, unspecified: Secondary | ICD-10-CM | POA: Diagnosis not present

## 2021-08-18 DIAGNOSIS — F32A Depression, unspecified: Secondary | ICD-10-CM | POA: Diagnosis present

## 2021-08-18 DIAGNOSIS — Z7984 Long term (current) use of oral hypoglycemic drugs: Secondary | ICD-10-CM

## 2021-08-18 DIAGNOSIS — E1122 Type 2 diabetes mellitus with diabetic chronic kidney disease: Secondary | ICD-10-CM | POA: Diagnosis present

## 2021-08-18 DIAGNOSIS — I429 Cardiomyopathy, unspecified: Secondary | ICD-10-CM | POA: Diagnosis present

## 2021-08-18 DIAGNOSIS — R000 Tachycardia, unspecified: Secondary | ICD-10-CM | POA: Diagnosis not present

## 2021-08-18 DIAGNOSIS — Z841 Family history of disorders of kidney and ureter: Secondary | ICD-10-CM

## 2021-08-18 DIAGNOSIS — D6959 Other secondary thrombocytopenia: Secondary | ICD-10-CM | POA: Diagnosis present

## 2021-08-18 DIAGNOSIS — K521 Toxic gastroenteritis and colitis: Secondary | ICD-10-CM | POA: Diagnosis present

## 2021-08-18 DIAGNOSIS — J988 Other specified respiratory disorders: Secondary | ICD-10-CM | POA: Diagnosis not present

## 2021-08-18 DIAGNOSIS — I5022 Chronic systolic (congestive) heart failure: Secondary | ICD-10-CM | POA: Diagnosis present

## 2021-08-18 DIAGNOSIS — I447 Left bundle-branch block, unspecified: Secondary | ICD-10-CM | POA: Diagnosis not present

## 2021-08-18 DIAGNOSIS — J40 Bronchitis, not specified as acute or chronic: Secondary | ICD-10-CM | POA: Diagnosis not present

## 2021-08-18 DIAGNOSIS — R0602 Shortness of breath: Secondary | ICD-10-CM | POA: Diagnosis not present

## 2021-08-18 DIAGNOSIS — J209 Acute bronchitis, unspecified: Secondary | ICD-10-CM

## 2021-08-18 DIAGNOSIS — I130 Hypertensive heart and chronic kidney disease with heart failure and stage 1 through stage 4 chronic kidney disease, or unspecified chronic kidney disease: Secondary | ICD-10-CM | POA: Diagnosis present

## 2021-08-18 DIAGNOSIS — I517 Cardiomegaly: Secondary | ICD-10-CM | POA: Diagnosis not present

## 2021-08-18 DIAGNOSIS — J211 Acute bronchiolitis due to human metapneumovirus: Secondary | ICD-10-CM | POA: Diagnosis present

## 2021-08-18 DIAGNOSIS — Z9484 Stem cells transplant status: Secondary | ICD-10-CM

## 2021-08-18 DIAGNOSIS — A4189 Other specified sepsis: Principal | ICD-10-CM | POA: Diagnosis present

## 2021-08-18 DIAGNOSIS — N183 Chronic kidney disease, stage 3 unspecified: Secondary | ICD-10-CM | POA: Diagnosis present

## 2021-08-18 DIAGNOSIS — Z833 Family history of diabetes mellitus: Secondary | ICD-10-CM

## 2021-08-18 DIAGNOSIS — J90 Pleural effusion, not elsewhere classified: Secondary | ICD-10-CM | POA: Diagnosis not present

## 2021-08-18 DIAGNOSIS — I214 Non-ST elevation (NSTEMI) myocardial infarction: Secondary | ICD-10-CM

## 2021-08-18 DIAGNOSIS — C9001 Multiple myeloma in remission: Secondary | ICD-10-CM | POA: Diagnosis present

## 2021-08-18 DIAGNOSIS — D696 Thrombocytopenia, unspecified: Secondary | ICD-10-CM | POA: Diagnosis not present

## 2021-08-18 DIAGNOSIS — E118 Type 2 diabetes mellitus with unspecified complications: Secondary | ICD-10-CM | POA: Diagnosis present

## 2021-08-18 DIAGNOSIS — M5134 Other intervertebral disc degeneration, thoracic region: Secondary | ICD-10-CM | POA: Diagnosis not present

## 2021-08-18 DIAGNOSIS — Z803 Family history of malignant neoplasm of breast: Secondary | ICD-10-CM

## 2021-08-18 DIAGNOSIS — B9789 Other viral agents as the cause of diseases classified elsewhere: Secondary | ICD-10-CM | POA: Diagnosis not present

## 2021-08-18 DIAGNOSIS — Z800 Family history of malignant neoplasm of digestive organs: Secondary | ICD-10-CM

## 2021-08-18 DIAGNOSIS — Z9481 Bone marrow transplant status: Secondary | ICD-10-CM

## 2021-08-18 DIAGNOSIS — R768 Other specified abnormal immunological findings in serum: Secondary | ICD-10-CM | POA: Diagnosis not present

## 2021-08-18 DIAGNOSIS — J208 Acute bronchitis due to other specified organisms: Secondary | ICD-10-CM | POA: Diagnosis not present

## 2021-08-18 DIAGNOSIS — G620 Drug-induced polyneuropathy: Secondary | ICD-10-CM | POA: Diagnosis present

## 2021-08-18 DIAGNOSIS — I428 Other cardiomyopathies: Secondary | ICD-10-CM

## 2021-08-18 DIAGNOSIS — T451X5A Adverse effect of antineoplastic and immunosuppressive drugs, initial encounter: Secondary | ICD-10-CM | POA: Diagnosis present

## 2021-08-18 DIAGNOSIS — J45909 Unspecified asthma, uncomplicated: Secondary | ICD-10-CM | POA: Diagnosis present

## 2021-08-18 DIAGNOSIS — A419 Sepsis, unspecified organism: Secondary | ICD-10-CM | POA: Diagnosis present

## 2021-08-18 DIAGNOSIS — R079 Chest pain, unspecified: Secondary | ICD-10-CM | POA: Diagnosis not present

## 2021-08-18 DIAGNOSIS — R509 Fever, unspecified: Secondary | ICD-10-CM

## 2021-08-18 DIAGNOSIS — I350 Nonrheumatic aortic (valve) stenosis: Secondary | ICD-10-CM

## 2021-08-18 DIAGNOSIS — Z8249 Family history of ischemic heart disease and other diseases of the circulatory system: Secondary | ICD-10-CM

## 2021-08-18 DIAGNOSIS — M109 Gout, unspecified: Secondary | ICD-10-CM | POA: Diagnosis present

## 2021-08-18 DIAGNOSIS — Z79891 Long term (current) use of opiate analgesic: Secondary | ICD-10-CM

## 2021-08-18 DIAGNOSIS — J159 Unspecified bacterial pneumonia: Secondary | ICD-10-CM | POA: Diagnosis not present

## 2021-08-18 DIAGNOSIS — K219 Gastro-esophageal reflux disease without esophagitis: Secondary | ICD-10-CM | POA: Diagnosis present

## 2021-08-18 DIAGNOSIS — Z886 Allergy status to analgesic agent status: Secondary | ICD-10-CM

## 2021-08-18 DIAGNOSIS — Q231 Congenital insufficiency of aortic valve: Secondary | ICD-10-CM | POA: Diagnosis not present

## 2021-08-18 DIAGNOSIS — R059 Cough, unspecified: Secondary | ICD-10-CM | POA: Diagnosis not present

## 2021-08-18 DIAGNOSIS — I5021 Acute systolic (congestive) heart failure: Secondary | ICD-10-CM | POA: Diagnosis present

## 2021-08-18 DIAGNOSIS — I493 Ventricular premature depolarization: Secondary | ICD-10-CM | POA: Diagnosis not present

## 2021-08-18 DIAGNOSIS — B9781 Human metapneumovirus as the cause of diseases classified elsewhere: Secondary | ICD-10-CM | POA: Diagnosis not present

## 2021-08-18 DIAGNOSIS — G8929 Other chronic pain: Secondary | ICD-10-CM | POA: Diagnosis present

## 2021-08-18 DIAGNOSIS — Z885 Allergy status to narcotic agent status: Secondary | ICD-10-CM

## 2021-08-18 HISTORY — DX: Ventricular premature depolarization: I49.3

## 2021-08-18 HISTORY — DX: Nonrheumatic aortic (valve) stenosis: I35.0

## 2021-08-18 HISTORY — DX: Chronic diastolic (congestive) heart failure: I50.32

## 2021-08-18 HISTORY — DX: Supraventricular tachycardia, unspecified: I47.10

## 2021-08-18 HISTORY — DX: Supraventricular tachycardia: I47.1

## 2021-08-18 LAB — URINALYSIS, ROUTINE W REFLEX MICROSCOPIC
Bacteria, UA: NONE SEEN
Bilirubin Urine: NEGATIVE
Glucose, UA: NEGATIVE mg/dL
Hgb urine dipstick: NEGATIVE
Ketones, ur: NEGATIVE mg/dL
Nitrite: NEGATIVE
Protein, ur: 30 mg/dL — AB
Specific Gravity, Urine: 1.032 — ABNORMAL HIGH (ref 1.005–1.030)
pH: 5 (ref 5.0–8.0)

## 2021-08-18 LAB — CBC WITH DIFFERENTIAL/PLATELET
Abs Immature Granulocytes: 0.02 10*3/uL (ref 0.00–0.07)
Basophils Absolute: 0 10*3/uL (ref 0.0–0.1)
Basophils Relative: 0 %
Eosinophils Absolute: 0.1 10*3/uL (ref 0.0–0.5)
Eosinophils Relative: 1 %
HCT: 35.9 % — ABNORMAL LOW (ref 36.0–46.0)
Hemoglobin: 12.1 g/dL (ref 12.0–15.0)
Immature Granulocytes: 0 %
Lymphocytes Relative: 13 %
Lymphs Abs: 1 10*3/uL (ref 0.7–4.0)
MCH: 27.3 pg (ref 26.0–34.0)
MCHC: 33.7 g/dL (ref 30.0–36.0)
MCV: 80.9 fL (ref 80.0–100.0)
Monocytes Absolute: 0.5 10*3/uL (ref 0.1–1.0)
Monocytes Relative: 7 %
Neutro Abs: 6 10*3/uL (ref 1.7–7.7)
Neutrophils Relative %: 79 %
Platelets: 93 10*3/uL — ABNORMAL LOW (ref 150–400)
RBC: 4.44 MIL/uL (ref 3.87–5.11)
RDW: 14.6 % (ref 11.5–15.5)
WBC: 7.7 10*3/uL (ref 4.0–10.5)
nRBC: 0 % (ref 0.0–0.2)

## 2021-08-18 LAB — COMPREHENSIVE METABOLIC PANEL
ALT: 57 U/L — ABNORMAL HIGH (ref 0–44)
AST: 37 U/L (ref 15–41)
Albumin: 4.2 g/dL (ref 3.5–5.0)
Alkaline Phosphatase: 92 U/L (ref 38–126)
Anion gap: 11 (ref 5–15)
BUN: 23 mg/dL (ref 8–23)
CO2: 20 mmol/L — ABNORMAL LOW (ref 22–32)
Calcium: 8.8 mg/dL — ABNORMAL LOW (ref 8.9–10.3)
Chloride: 99 mmol/L (ref 98–111)
Creatinine, Ser: 1.12 mg/dL — ABNORMAL HIGH (ref 0.44–1.00)
GFR, Estimated: 55 mL/min — ABNORMAL LOW (ref >60)
Glucose, Bld: 146 mg/dL — ABNORMAL HIGH (ref 70–99)
Potassium: 3.9 mmol/L (ref 3.5–5.1)
Sodium: 130 mmol/L — ABNORMAL LOW (ref 135–145)
Total Bilirubin: 0.5 mg/dL (ref 0.3–1.2)
Total Protein: 6.7 g/dL (ref 6.5–8.1)

## 2021-08-18 LAB — PROTIME-INR
INR: 1 (ref 0.8–1.2)
Prothrombin Time: 13.1 seconds (ref 11.4–15.2)

## 2021-08-18 LAB — TROPONIN I (HIGH SENSITIVITY)
Troponin I (High Sensitivity): 7 ng/L (ref <18)
Troponin I (High Sensitivity): 7 ng/L (ref <18)
Troponin I (High Sensitivity): 13 ng/L (ref <18)

## 2021-08-18 LAB — LACTIC ACID, PLASMA
Lactic Acid, Venous: 1 mmol/L (ref 0.5–1.9)
Lactic Acid, Venous: 0.9 mmol/L (ref 0.5–1.9)

## 2021-08-18 LAB — RESP PANEL BY RT-PCR (FLU A&B, COVID) ARPGX2
Influenza A by PCR: NEGATIVE
Influenza B by PCR: NEGATIVE
SARS Coronavirus 2 by RT PCR: NEGATIVE

## 2021-08-18 LAB — PROCALCITONIN: Procalcitonin: <0.1 ng/mL

## 2021-08-18 MED ORDER — ALBUTEROL SULFATE (2.5 MG/3ML) 0.083% IN NEBU
2.5000 mg | INHALATION_SOLUTION | Freq: Once | RESPIRATORY_TRACT | Status: AC
  Administered 2021-08-19: 2.5 mg via RESPIRATORY_TRACT
  Filled 2021-08-18: qty 3

## 2021-08-18 MED ORDER — PROMETHAZINE HCL 25 MG/ML IJ SOLN
12.5000 mg | Freq: Once | INTRAMUSCULAR | Status: AC
Start: 2021-08-18 — End: 2021-08-19
  Administered 2021-08-19: 02:00:00 12.5 mg via INTRAVENOUS
  Filled 2021-08-18: qty 12.5

## 2021-08-18 MED ORDER — SODIUM CHLORIDE 0.9 % IV BOLUS
1000.0000 mL | Freq: Once | INTRAVENOUS | Status: AC
Start: 2021-08-18 — End: 2021-08-18
  Administered 2021-08-18: 1000 mL via INTRAVENOUS

## 2021-08-18 MED ORDER — IBUPROFEN 400 MG PO TABS
400.0000 mg | ORAL_TABLET | Freq: Once | ORAL | Status: AC
Start: 2021-08-18 — End: 2021-08-18
  Administered 2021-08-18: 400 mg via ORAL
  Filled 2021-08-18: qty 1

## 2021-08-18 MED ORDER — SODIUM CHLORIDE 0.9 % IV SOLN
1.0000 g | Freq: Once | INTRAVENOUS | Status: AC
  Administered 2021-08-19: 1 g via INTRAVENOUS
  Filled 2021-08-18: qty 10

## 2021-08-18 MED ORDER — SODIUM CHLORIDE 0.9 % IV BOLUS
1000.0000 mL | Freq: Once | INTRAVENOUS | Status: AC
Start: 2021-08-18 — End: 2021-08-19
  Administered 2021-08-18: 1000 mL via INTRAVENOUS

## 2021-08-18 MED ORDER — IOHEXOL 350 MG/ML SOLN
75.0000 mL | Freq: Once | INTRAVENOUS | Status: AC | PRN
  Administered 2021-08-18: 75 mL via INTRAVENOUS

## 2021-08-18 NOTE — ED Provider Notes (Signed)
[facility] Provider Note    Event Date/Time   First MD Initiated Contact with Patient 08/18/21 1808     (approximate)   History   Shortness of Breath   HPI  Sarah [name] is a 66 y.o. female with past medical history of multiple myeloma status post bone marrow transplant on chemotherapy, idiopathic cardiomyopathy, GERD, diabetes presents with fever and cough.  Patient has been having symptoms over the last 4 days.  She has a cough productive of green sputum as well as some generalized chest tightness from coughing as well as shortness of breath.  Has had nausea decreased p.o. intake and generally feeling fatigued.  She was prescribed a Z-Pak which she has been taking.  She spoke with her oncologist today who encouraged her to come to the emergency department given fever was 103 at home.  She has been exposed to her 49-year-old niece who has had a cough and similar respiratory illness.  She denies abdominal pain dysuria diarrhea.    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   Due to Zometa   Cardiomyopathy, idiopathic ([place]) 08/21/2017   Overview:  Septal and apical hypokinesis with ef 40%   CHF (congestive heart failure) (HCC)    CKD (chronic kidney disease) stage 3, GFR 30-59 ml/min (HCC)    Depression    Diabetes mellitus ([place])    Dizziness    Fatty liver    Frequent falls    GERD (gastroesophageal reflux disease)    Gout    Heart murmur    History of blood transfusion    History of bone marrow transplant ([place])    History of uterine fibroid    Hx of cardiac catheterization 06/05/2016   Overview:  Normal coronaries 2017   Hypertension    Hypomagnesemia    IDA (iron deficiency anemia)    Multiple myeloma ([place])     Personal history of chemotherapy    Renal cyst    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[place]) 06/05/2016   Overview:  Sp ablation 1999    Patient Active Problem List   Diagnosis Date Noted   Acute gingivitis 06/24/2021   URI (upper respiratory infection) 06/23/2021   Chronic diarrhea 06/23/2021   Multiple myeloma in remission ([place]) 05/14/2021   Leg swelling 04/02/2021   Neuropathy 04/02/2021   Fever in adult 03/05/2021   Frequent PVCs 12/04/2020   Diarrhea 10/25/2020   Encounter for antineoplastic chemotherapy 08/31/2020   Coronary artery disease involving native coronary artery of native heart 04/26/2020   Yeast vaginitis 03/18/2020   Hypokalemia 03/11/2020   UTI (urinary tract infection) 03/03/2020   Dyspnea 03/02/2020   Physical deconditioning 02/07/2020   Impaired nutrition 02/07/2020   Sepsis ([place]) 11/13/2019   Encounter for antineoplastic immunotherapy 10/03/2019   Hypocalcemia 08/28/2019   Chronic nausea 12/17/2018   Chemotherapy-induced neuropathy ([place]) 12/17/2018   Hyperlipidemia associated with type 2 diabetes mellitus ([place]) 11/25/2018   Anemia 10/21/2018   Hypomagnesemia 08/20/2018   Goals of care, counseling/discussion 08/20/2018   B12 deficiency 08/20/2018   Thrombocytopenia ([place]) 02/09/2018   Benign essential HTN 08/21/2017   Carotid artery stenosis, asymptomatic, bilateral 08/06/2017   Thyroid nodule 08/06/2017   Iron deficiency anemia due to chronic blood loss 05/13/2017  Spinal stenosis of lumbar region with neurogenic claudication 04/09/2017   Long term prescription opiate use 09/07/2016   Chronic pain syndrome 07/08/2016   Pain medication agreement signed 07/08/2016   Spondylosis of lumbar region without myelopathy or radiculopathy 07/08/2016   Severe aortic stenosis 06/05/2016   Environmental and seasonal allergies 04/19/2016   Insomnia 04/19/2016   Asthma 04/19/2016   Aortic regurgitation 04/19/2016   Type II diabetes mellitus with complication ([place]) 22/08/5425    Depression, major, single episode, in partial remission ([place]) 04/12/2016   Irritable bowel syndrome (IBS) 04/12/2016   Hepatic steatosis 04/12/2016   Multiple myeloma not having achieved remission ([place]) 04/02/2016   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[place]) 07/06/2015   Stem cell transplant candidate 05/16/2015     Physical Exam  Triage Vital Signs: ED Triage Vitals  Enc Vitals Group     BP 08/18/21 1726 (!) 146/83     Pulse Rate 08/18/21 1726 64     Resp 08/18/21 1726 (!) 24     Temp 08/18/21 1726 (!) 101.9 F (38.8 C)     Temp Source 08/18/21 1726 Oral     SpO2 08/18/21 1726 98 %     Weight 08/18/21 1723 160 lb (72.6 kg)     Height 08/18/21 1723 _0  (1.575 m)     Head Circumference --      Peak Flow --      Pain Score 08/18/21 1723 6     Pain Loc --      Pain Edu? --      Excl. in [place]? --     Most recent vital signs: Vitals:   08/18/21 2012 08/18/21 2252  BP: (!) 150/89 (!) 152/76  Pulse: (!) 118 (!) 105  Resp: 18 16  Temp:  99.8 F (37.7 C)  SpO2: 97% 98%     General: Awake, no distress.  CV:  Good peripheral perfusion.  Resp:  Normal effort.  Frequent coughing, focal wheeze in the left lung field Abd:  No distention.  Soft and nontender throughout Neuro:             Awake, Alert, Oriented x 3  Other:     ED Results / Procedures / Treatments  Labs (all labs ordered are listed, but only abnormal results are displayed) Labs Reviewed  COMPREHENSIVE METABOLIC PANEL - Abnormal; Notable for the following components:      Result Value   Sodium 130 (*)    CO2 20 (*)    Glucose, Bld 146 (*)    Creatinine, Ser 1.12 (*)    Calcium 8.8 (*)    ALT 57 (*)    GFR, Estimated 55 (*)    All other components within normal limits  CBC WITH DIFFERENTIAL/PLATELET - Abnormal; Notable for the following components:   HCT 35.9 (*)    Platelets 93 (*)    All other components within normal limits  URINALYSIS, ROUTINE W REFLEX MICROSCOPIC - Abnormal; Notable for  the following components:   Color, Urine YELLOW (*)    APPearance HAZY (*)    Specific Gravity, Urine 1.032 (*)    Protein, ur 30 (*)    Leukocytes,Ua LARGE (*)    Non Squamous Epithelial PRESENT (*)    All other components within normal limits  RESP PANEL BY RT-PCR (FLU A&B, COVID) ARPGX2  CULTURE, BLOOD (ROUTINE X 2)  CULTURE, BLOOD (ROUTINE X 2)  URINE CULTURE  LACTIC ACID, PLASMA  LACTIC ACID, PLASMA  PROTIME-INR  PROCALCITONIN  TROPONIN I (HIGH SENSITIVITY)  TROPONIN I (HIGH SENSITIVITY)  TROPONIN I (HIGH SENSITIVITY)     EKG  EKG interpreted by myself, ventricular bigeminy, Intraventricular conduction delay no acute ischemic changes heart rate 115   RADIOLOGY EKG interpretation performed by myself: NSR, nml axis, nml intervals, no acute ischemic changes    PROCEDURES:  Critical Care performed: No  .1-3 Lead EKG Interpretation Performed by: Rada Hay, MD Authorized by: Rada Hay, MD     Interpretation: non-specific     ECG rate assessment: tachycardic     Rhythm: sinus rhythm     Ectopy: PVCs     Conduction: normal    The patient is on the cardiac monitor to evaluate for evidence of arrhythmia and/or significant heart rate changes.   MEDICATIONS ORDERED IN ED: Medications  albuterol (PROVENTIL) (2.5 MG/3ML) 0.083% nebulizer solution 2.5 mg (has no administration in time range)  promethazine (PHENERGAN) 12.5 mg in sodium chloride 0.9 % 50 mL IVPB (has no administration in time range)  cefTRIAXone (ROCEPHIN) 1 g in sodium chloride 0.9 % 100 mL IVPB (has no administration in time range)  sodium chloride 0.9 % bolus 1,000 mL (0 mLs Intravenous Stopped 08/18/21 2109)  ibuprofen (ADVIL) tablet 400 mg (400 mg Oral Given 08/18/21 1945)  iohexol (OMNIPAQUE) 350 MG/ML injection 75 mL (75 mLs Intravenous Contrast Given 08/18/21 1952)  sodium chloride 0.9 % bolus 1,000 mL (1,000 mLs Intravenous New Bag/Given 08/18/21 2248)     IMPRESSION / MDM /  ASSESSMENT AND PLAN / ED COURSE  I reviewed the triage vital signs and the nursing notes.                              Differential diagnosis includes, but is not limited to, viral illness, bronchitis, pneumonia, bacteremia, sepsis, pulmonary embolism  Patient is a 66 year old female with a history of multiple myeloma status post bone marrow transplant on chemotherapy who presents with fever and cough.  Has had cough productive of green sputum x4 days accompanied by shortness of breath and chest tightness.  Fever of 103 at home.  She is febrile to 101.9 here and tachycardic and tachypneic.  Patient however looks well.  She has frequent cough some focal lung wheezing but otherwise good air movement.  Abdomen is benign.  I reviewed her labs which are overall reassuring, she has no leukocytosis normal hemoglobin.  Is mildly hyponatremic with a sodium of 130 and creatinine of 1.1, her lactate is normal.  We will send a procalcitonin and a troponin.  We will treat the fever with NSAIDs and give a liter of fluids.  Will obtain a CTA both rule pulmonary embolism but also to further evaluate the parenchyma for signs of infiltrate to suggest pneumonia.  Blood cultures sent.  CTA is negative for PE does not show any evidence of pneumonia. Patient continues to be somewhat tachycardic even after fever has defervesced.  She did complain of ongoing chest pain, repeat troponin negative.  Repeat EKG showing left bundle not meeting Sgarbossa criteria but read as acute MI on EKG.  Discussed with Dr. Velva Harman who is not concerned.  Patient's UA does have 21-50 WBCs, she has no urinary symptoms but given the fever will treat with Rocephin.  Patient prefers to be admitted and given her immunosuppression and fever I think this is reasonable.  Discussed with the hospitalist for admission     Clinical Course as of 08/18/21  2321  Sat Aug 18, 2021  2048 Procalcitonin: <0.10 [KM]    Clinical Course User Index [KM] Rada Hay, MD     FINAL CLINICAL IMPRESSION(S) / ED DIAGNOSES   Final diagnoses:  Fever, unspecified fever cause  Bronchitis  Urinary tract infection without hematuria, site unspecified     Rx / DC Orders   ED Discharge Orders     None        Note:  This document was prepared using Dragon voice recognition software and may include unintentional dictation errors.   Rada Hay, MD 08/18/21 [phone]

## 2021-08-18 NOTE — H&P (Signed)
History and Physical    Patient: Sarah [name] HCW:[id number] DOB: 1956/06/27 DOA: 08/18/2021 DOS: the patient was seen and examined on 08/18/2021 PCP: Glean Hess, MD  Patient coming from: Home  Chief Complaint:  Chief Complaint  Patient presents with   Shortness of Breath    HPI: Sarah [name] is a 66 y.o. female with medical history significant for Multiple myeloma s/p autologous stem cell transplant and on maintenance chemotherapy , asthma, DM,  severe nonrheumatic aortic valve stenosis followed by [facility] cardiology thrombocytopenia, chronic pain from spinal stenosis , chemotherapy induced neuropathy and diarrhea hospitalized in September 2022 with possible CAP and E. faecalis UTI, after presenting with fever, and again in December 2022 with URI ruled out for sepsis, presenting with fever, who again presents with a fever of 103 at home, with complaints of cough, sore throat, hoarseness.  She endorses nausea.Denies abdominal pain or dysuria.  ED course: Tmax 102.9, pulse 118 with respirations 24 and O2 sat 98% on room air.  BP 146/83 Blood work CBC WNL except for thrombocytopenia of 93,000.  Lactic acid 0.9 and procalcitonin less than 0.1 CMP with sodium 130, creatinine 1.12Up from baseline of 0.83 and bicarb 20 Troponin 01-05-2012 Urinalysis with large leukocyte esterase COVID and flu negative  EKG, personally viewed and interpreted: Sinus tachycardia at 115 with no acute ST-T wave changes  Imaging: CT angio chest with no PE, possible bronchitis pattern Please refer to complete report  Patient treated with IV fluid boluses, nebulized albuterol, and started with ceftriaxone for possible UTI.  Hospitalist consulted for admission.  Review of Systems: As mentioned in the history of present illness. All other systems reviewed and are negative. 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[place]) 02/25/2017   Due to Zometa   Cardiomyopathy, idiopathic ([place]) 08/21/2017   Overview:  Septal and apical hypokinesis with ef 40%   CHF (congestive heart failure) (HCC)    CKD (chronic kidney disease) stage 3, GFR 30-59 ml/min (HCC)    Depression    Diabetes mellitus (HCC)    Dizziness    Fatty liver    Frequent falls    GERD (gastroesophageal reflux disease)    Gout    Heart murmur    History of blood transfusion    History of bone marrow transplant ([place])    History of uterine fibroid    Hx of cardiac catheterization 06/05/2016   Overview:  Normal coronaries 2017   Hypertension    Hypomagnesemia    IDA (iron deficiency anemia)    Multiple myeloma ([place])    Personal history of chemotherapy    Renal cyst    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[place]) 06/05/2016   Overview:  Sp ablation 1999   Past Surgical History:  Procedure Laterality Date   ABDOMINAL HYSTERECTOMY     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   CARDIAC ELECTROPHYSIOLOGY MAPPING AND ABLATION     CARPAL TUNNEL RELEASE Bilateral    CHOLECYSTECTOMY  2008   COLONOSCOPY WITH PROPOFOL N/A 05/07/2017   Procedure: COLONOSCOPY WITH PROPOFOL;  Surgeon: [name], MD;  Location: [facility] ENDOSCOPY;  Service: Gastroenterology;  Laterality: N/A;   COLONOSCOPY WITH PROPOFOL N/A 12/19/2020   Procedure: COLONOSCOPY WITH PROPOFOL;  Surgeon: [name], MD;  Location: [facility]  Service: Gastroenterology;  Laterality: N/A;   ESOPHAGOGASTRODUODENOSCOPY (EGD) WITH PROPOFOL N/A 05/07/2017   Procedure: ESOPHAGOGASTRODUODENOSCOPY (EGD) WITH PROPOFOL;  Surgeon: [name], MD;  Location: [facility] ENDOSCOPY;  Service: Gastroenterology;  Laterality: N/A;   FOOT SURGERY Bilateral    INCONTINENCE SURGERY  2009   INTERSTIM IMPLANT PLACEMENT     other     over active bladder   OTHER SURGICAL HISTORY     bladder stimulator    PARTIAL  HYSTERECTOMY  03/1996   fibroids   PORTA CATH INSERTION N/A 03/10/2019   Procedure: PORTA CATH INSERTION;  Surgeon: Algernon Huxley, MD;  Location: [place] [facility] CV LAB;  Service: Cardiovascular;  Laterality: N/A;   TONSILLECTOMY  2007   Social History:  reports that she quit smoking about 30 years ago. Her smoking use included cigarettes. She has a 20.00 pack-year smoking history. She has never used smokeless tobacco. She reports current alcohol use. She reports that she does not use drugs.  Allergies  Allergen Reactions   Oxycodone-Acetaminophen Anaphylaxis    Swelling and rash   Celebrex [Celecoxib] Diarrhea   Codeine    Plerixafor     In 2016 during ASCT collection patient developed fever to 103.74F and required hospitalization   Benadryl [Diphenhydramine] Palpitations   Morphine Itching and Rash   Ondansetron Diarrhea   Tylenol [Acetaminophen] Itching and Rash    Family History  Problem Relation Age of Onset   Colon cancer Father    Renal Disease Father    Diabetes Mellitus II Father    Melanoma Paternal Grandmother    Breast cancer Maternal Aunt 68   Anemia Mother    Heart disease Mother    Heart failure Mother    Renal Disease Mother    Congestive Heart Failure Mother    Heart disease Maternal Uncle    Throat cancer Maternal Uncle    Lung cancer Maternal Uncle    Liver disease Maternal Uncle    Heart failure Maternal Uncle    Hearing loss Son 5       Suicide     Prior to Admission medications   Medication Sig Start Date End Date Taking? Authorizing Provider  acyclovir (ZOVIRAX) 400 MG tablet Take 1 tablet (400 mg total) by mouth 2 (two) times daily. 03/28/21  Yes Earlie Server, MD  albuterol (VENTOLIN HFA) 108 (90 Base) MCG/ACT inhaler Inhale 2 puffs into the lungs every 6 (six) hours as needed for wheezing or shortness of breath. 04/16/21  Yes Earlie Server, MD  allopurinol (ZYLOPRIM) 100 MG tablet TAKE 1 TABLET(100 MG) BY MOUTH DAILY as needed 12/02/18  Yes Glean Hess, MD   bisoprolol (ZEBETA) 5 MG tablet Take 5 mg by mouth daily. 07/05/20  Yes [provider]  diclofenac sodium (VOLTAREN) 1 % GEL Apply 2 g topically 4 (four) times daily. 01/25/19  Yes Gertie Baron, NP  FLUoxetine (PROZAC) 40 MG capsule TAKE 1 CAPSULE EVERY DAY 03/26/21  Yes Glean Hess, MD  fluticasone [facility]) 50 MCG/ACT nasal spray Place 2 sprays into both nostrils daily. 07/10/18  Yes Glean Hess, MD  montelukast (SINGULAIR) 10 MG tablet Take 1 tablet (10 mg total) by mouth See admin instructions. Take 1 tab daily for 2 days after daratumumab treatments 06/06/21  Yes Earlie Server, MD  Multiple Vitamin (MULTIVITAMIN) tablet Take 1 tablet by mouth daily.   Yes [provider]  omeprazole (PRILOSEC) 40 MG capsule TAKE 1 CAPSULE (40 MG TOTAL) BY MOUTH IN THE MORNING AND AT  BEDTIME. 05/27/21  Yes Glean Hess, MD  promethazine (PHENERGAN) 25 MG tablet Take 1 tablet (25 mg total) by mouth every 6 (six) hours as needed for nausea or vomiting. 08/01/21  Yes Earlie Server, MD  tiZANidine (ZANAFLEX) 2 MG tablet Take 1 tablet (2 mg total) by mouth every 6 (six) hours as needed for muscle spasms. 05/28/21  Yes Glean Hess, MD  traZODone (DESYREL) 100 MG tablet TAKE 1 TABLET AT BEDTIME 04/30/21  Yes Glean Hess, MD  acetaminophen (TYLENOL) 500 MG tablet Take 500 mg by mouth every 6 (six) hours as needed. Patient not taking: Reported on 08/18/2021    [provider]  azithromycin (ZITHROMAX) 250 MG tablet Take 2 tablets on first day and then 1 tab daily until finish. Patient not taking: Reported on 08/18/2021 08/17/21   Earlie Server, MD  fluconazole (DIFLUCAN) 150 MG tablet Take 1 tablet (150 mg total) by mouth once as needed for up to 1 dose (vaginal yeast infection symptoms). 08/17/21   Earlie Server, MD  glucose blood test strip  09/04/15   [provider]  nortriptyline (PAMELOR) 10 MG capsule Take 10 mg at night for a week then increase to 20 mg at night for a week  then increase to 30 mg at night and continue that dose Patient not taking: Reported on 08/18/2021 07/23/21   [provider]  promethazine-dextromethorphan (PROMETHAZINE-DM) 6.25-15 MG/5ML syrup Take 5 mLs by mouth 4 (four) times daily as needed for cough. Patient not taking: Reported on 07/04/2021 06/25/21   Glean Hess, MD  sucralfate (CARAFATE) 1 g tablet Take 1 tablet (1 g total) by mouth 4 (four) times daily -  with meals and at bedtime. Patient not taking: Reported on 08/01/2021 04/17/21 07/11/21  [name], MD    Physical Exam: Vitals:   08/18/21 1726 08/18/21 1930 08/18/21 2012 08/18/21 2252  BP: (!) 146/83 (!) 147/70 (!) 150/89 (!) 152/76  Pulse: 64 (!) 59 (!) 118 (!) 105  Resp: (!) _0 Temp: (!) 101.9 F (38.8 C) (!) 102.9 F (39.4 C)  99.8 F (37.7 C)  TempSrc: Oral Oral  Oral  SpO2: 98% 95% 97% 98%  Weight:      Height:       Physical Exam Vitals and nursing note reviewed.  Constitutional:      General: She is not in acute distress.    Appearance: Normal appearance.     Comments: Congested cough  HENT:     Head: Normocephalic and atraumatic.  Cardiovascular:     Rate and Rhythm: Normal rate and regular rhythm.     Pulses: Normal pulses.     Heart sounds: Normal heart sounds. No murmur heard. Pulmonary:     Effort: Pulmonary effort is normal.     Breath sounds: Normal breath sounds. No wheezing or rhonchi.  Abdominal:     General: Bowel sounds are normal.     Palpations: Abdomen is soft.     Tenderness: There is no abdominal tenderness.  Musculoskeletal:        General: No swelling or tenderness. Normal range of motion.     Cervical back: Normal range of motion and neck supple.  Skin:    General: Skin is warm and dry.  Neurological:     General: No focal deficit present.     Mental Status: She is alert. Mental status is at baseline.  Psychiatric:        Mood and Affect:  Mood normal.        Behavior: Behavior normal.     Data  Reviewed: Notes from primary care and specialist visits, past discharge summaries. Prior diagnostic testing as applicable to current admission diagnoses Updated medications and problem lists for reconciliation ED course, including vitals, labs, imaging, treatment and response to treatment Triage notes and ED providers notes See HPI for list of pertinent results  Assessment and Plan: No notes have been filed under this hospital service. Service: Hospitalist   Bronchitis, viral -CTA with possible bronchitic pattern.  Procalcitonin less than 0.1 - COVID and flu are negative - Antitussives, decongestants - Albuterol as needed   UTI   -Sepsis criteria includes fever, tachycardia.  WBC normal but could be as patient is immunosuppressed - continue Rocephin -Follow cultures - IV fluids per sepsis protocol   Possible urinary tract infection - History of a faecalis UTI in September - Follow cultures - Continue Rocephin       Multiple myeloma not having achieved remission ([place])   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[place])   On maintenance chemotherapy - No acute issues - Follows with oncology     Type II diabetes mellitus with complication (HCC) - Sliding scale insulin coverage   Severe aortic stenosis, nonrheumatic - No acute issues -Close monitoring given sepsis fluids       Spinal stenosis of lumbar region with neurogenic claudication   Severe aortic stenosis, nonrheumatic - Followed at cardiology [facility]     Thrombocytopenia [facility]), chronic - Platelet count 91,000, at baseline, probably related to chemotherapy     Chemotherapy-induced neuropathy (HCC) - Continue Lyrica  Chemotherapy-induced diarrhea - Continue loperamide   Chronic pain secondary to spinal stenosis - Continue tramadol    Advance Care Planning:   Code Status: Prior   Consults: none  Family Communication: none  Severity of Illness: The appropriate patient status for this  patient is INPATIENT. Inpatient status is judged to be reasonable and necessary in order to provide the required intensity of service to ensure the patient's safety. The patient's presenting symptoms, physical exam findings, and initial radiographic and laboratory data in the context of their chronic comorbidities is felt to place them at high risk for further clinical deterioration. Furthermore, it is not anticipated that the patient will be medically stable for discharge from the hospital within 2 midnights of admission.   * I certify that at the point of admission it is my clinical judgment that the patient will require inpatient hospital care spanning beyond 2 midnights from the point of admission due to high intensity of service, high risk for further deterioration and high frequency of surveillance required.*  Author: Athena Masse, MD 08/18/2021 11:35 PM  For on call review [email or web address].

## 2021-08-18 NOTE — ED Notes (Signed)
1st set of cultures and labs sent to lab at this time.

## 2021-08-18 NOTE — ED Notes (Signed)
Pt to radiology at this tim

## 2021-08-18 NOTE — ED Notes (Signed)
Pt returned from CT, reports severe chest pain. Dr Starleen Blue at bedside at this time. EKG obtained. 02 applied with pt reporting moderate relief in pain.

## 2021-08-18 NOTE — ED Triage Notes (Addendum)
Pt via POV from home. Pt c/o of SOB, cough, and fever since Wednesday. Pt was prescribed Z Pak for bronchitis on Friday and pt states she noticed a fever of 103 today. Pt has a hx of multiple myeloma and is actively received chemo. Pt states she is unable to take Tylenol and can only take half of an Ibuprofen r/t her kidney issues. Pt is A&Ox4 and NAD.

## 2021-08-19 ENCOUNTER — Inpatient Hospital Stay: Payer: Medicare HMO

## 2021-08-19 ENCOUNTER — Encounter: Payer: Self-pay | Admitting: Internal Medicine

## 2021-08-19 DIAGNOSIS — J209 Acute bronchitis, unspecified: Secondary | ICD-10-CM

## 2021-08-19 DIAGNOSIS — C9000 Multiple myeloma not having achieved remission: Secondary | ICD-10-CM

## 2021-08-19 DIAGNOSIS — E118 Type 2 diabetes mellitus with unspecified complications: Secondary | ICD-10-CM

## 2021-08-19 DIAGNOSIS — J40 Bronchitis, not specified as acute or chronic: Secondary | ICD-10-CM | POA: Diagnosis not present

## 2021-08-19 DIAGNOSIS — I350 Nonrheumatic aortic (valve) stenosis: Secondary | ICD-10-CM

## 2021-08-19 DIAGNOSIS — Z9484 Stem cells transplant status: Secondary | ICD-10-CM | POA: Diagnosis not present

## 2021-08-19 DIAGNOSIS — R509 Fever, unspecified: Secondary | ICD-10-CM

## 2021-08-19 LAB — EXPECTORATED SPUTUM ASSESSMENT W GRAM STAIN, RFLX TO RESP C

## 2021-08-19 LAB — GLUCOSE, CAPILLARY
Glucose-Capillary: 164 mg/dL — ABNORMAL HIGH (ref 70–99)
Glucose-Capillary: 156 mg/dL — ABNORMAL HIGH (ref 70–99)
Glucose-Capillary: 133 mg/dL — ABNORMAL HIGH (ref 70–99)
Glucose-Capillary: 157 mg/dL — ABNORMAL HIGH (ref 70–99)
Glucose-Capillary: 152 mg/dL — ABNORMAL HIGH (ref 70–99)

## 2021-08-19 MED ORDER — FLUOXETINE HCL 20 MG PO CAPS
40.0000 mg | ORAL_CAPSULE | Freq: Every day | ORAL | Status: DC
  Administered 2021-08-19 – 2021-08-25 (×7): 40 mg via ORAL
  Filled 2021-08-19 (×7): qty 2

## 2021-08-19 MED ORDER — INSULIN ASPART 100 UNIT/ML IJ SOLN
0.0000 [IU] | Freq: Three times a day (TID) | INTRAMUSCULAR | Status: DC
  Administered 2021-08-19: 12:00:00 2 [IU] via SUBCUTANEOUS
  Administered 2021-08-19 – 2021-08-20 (×3): 3 [IU] via SUBCUTANEOUS
  Administered 2021-08-20 – 2021-08-21 (×3): 2 [IU] via SUBCUTANEOUS
  Administered 2021-08-21 – 2021-08-22 (×3): 3 [IU] via SUBCUTANEOUS
  Administered 2021-08-23: 2 [IU] via SUBCUTANEOUS
  Administered 2021-08-23: 3 [IU] via SUBCUTANEOUS
  Administered 2021-08-24 – 2021-08-25 (×4): 2 [IU] via SUBCUTANEOUS
  Filled 2021-08-19 (×16): qty 1

## 2021-08-19 MED ORDER — ACETAMINOPHEN 650 MG RE SUPP: 650.0000 mg | Freq: Four times a day (QID) | RECTAL | Status: DC | PRN

## 2021-08-19 MED ORDER — MENTHOL 3 MG MT LOZG
1.0000 | LOZENGE | OROMUCOSAL | Status: DC | PRN
  Administered 2021-08-19: 3 mg via ORAL
  Filled 2021-08-19 (×2): qty 9

## 2021-08-19 MED ORDER — BENZONATATE 100 MG PO CAPS
200.0000 mg | ORAL_CAPSULE | Freq: Three times a day (TID) | ORAL | Status: DC | PRN
  Administered 2021-08-19 – 2021-08-24 (×9): 200 mg via ORAL
  Filled 2021-08-19 (×9): qty 2

## 2021-08-19 MED ORDER — SODIUM CHLORIDE 0.9% FLUSH: 10.0000 mL | INTRAVENOUS | Status: DC | PRN

## 2021-08-19 MED ORDER — BENZONATATE 100 MG PO CAPS: 200.0000 mg | ORAL_CAPSULE | Freq: Three times a day (TID) | ORAL | Status: DC | PRN

## 2021-08-19 MED ORDER — SODIUM CHLORIDE 0.9 % IV SOLN: INTRAVENOUS | Status: DC

## 2021-08-19 MED ORDER — CHLORHEXIDINE GLUCONATE CLOTH 2 % EX PADS
6.0000 | MEDICATED_PAD | Freq: Every day | CUTANEOUS | Status: DC
  Administered 2021-08-19 – 2021-08-25 (×7): 6 via TOPICAL

## 2021-08-19 MED ORDER — ACETAMINOPHEN 325 MG PO TABS: 650.0000 mg | ORAL_TABLET | Freq: Four times a day (QID) | ORAL | Status: DC | PRN

## 2021-08-19 MED ORDER — TIZANIDINE HCL 2 MG PO TABS
2.0000 mg | ORAL_TABLET | Freq: Four times a day (QID) | ORAL | Status: DC | PRN
  Administered 2021-08-19 – 2021-08-25 (×12): 2 mg via ORAL
  Filled 2021-08-19 (×14): qty 1

## 2021-08-19 MED ORDER — SODIUM CHLORIDE 0.9 % IV SOLN
3.0000 g | Freq: Four times a day (QID) | INTRAVENOUS | Status: DC
  Administered 2021-08-19 – 2021-08-21 (×9): 3 g via INTRAVENOUS
  Filled 2021-08-19 (×2): qty 3
  Filled 2021-08-19 (×2): qty 8
  Filled 2021-08-19: qty 3
  Filled 2021-08-19 (×3): qty 8
  Filled 2021-08-19: qty 3
  Filled 2021-08-19 (×4): qty 8

## 2021-08-19 MED ORDER — ACYCLOVIR 200 MG PO CAPS
400.0000 mg | ORAL_CAPSULE | Freq: Two times a day (BID) | ORAL | Status: DC
  Administered 2021-08-19 – 2021-08-25 (×12): 400 mg via ORAL
  Filled 2021-08-19 (×14): qty 2

## 2021-08-19 MED ORDER — LOPERAMIDE HCL 2 MG PO CAPS
2.0000 mg | ORAL_CAPSULE | Freq: Two times a day (BID) | ORAL | Status: DC | PRN
  Administered 2021-08-19 – 2021-08-25 (×3): 2 mg via ORAL
  Filled 2021-08-19 (×3): qty 1

## 2021-08-19 MED ORDER — INSULIN ASPART 100 UNIT/ML IJ SOLN: 0.0000 [IU] | Freq: Every day | INTRAMUSCULAR | Status: DC

## 2021-08-19 MED ORDER — ONDANSETRON HCL 4 MG PO TABS: 4.0000 mg | ORAL_TABLET | Freq: Four times a day (QID) | ORAL | Status: DC | PRN

## 2021-08-19 MED ORDER — CEFTRIAXONE SODIUM 1 G IJ SOLR
1.0000 g | INTRAMUSCULAR | Status: DC
  Filled 2021-08-19: qty 10

## 2021-08-19 MED ORDER — IBUPROFEN 400 MG PO TABS
400.0000 mg | ORAL_TABLET | ORAL | Status: DC | PRN
  Administered 2021-08-19 – 2021-08-20 (×3): 400 mg via ORAL
  Filled 2021-08-19 (×3): qty 1

## 2021-08-19 MED ORDER — PROMETHAZINE HCL 25 MG PO TABS
12.5000 mg | ORAL_TABLET | Freq: Four times a day (QID) | ORAL | Status: DC | PRN
  Administered 2021-08-19 – 2021-08-24 (×7): 12.5 mg via ORAL
  Filled 2021-08-19 (×9): qty 1

## 2021-08-19 MED ORDER — DICLOFENAC SODIUM 1 % EX GEL
2.0000 g | Freq: Four times a day (QID) | CUTANEOUS | Status: DC | PRN
  Filled 2021-08-19: qty 100

## 2021-08-19 MED ORDER — ONDANSETRON HCL 4 MG/2ML IJ SOLN
4.0000 mg | Freq: Four times a day (QID) | INTRAMUSCULAR | Status: DC | PRN
  Administered 2021-08-21 – 2021-08-22 (×2): 4 mg via INTRAVENOUS
  Filled 2021-08-19 (×2): qty 2

## 2021-08-19 MED ORDER — MONTELUKAST SODIUM 10 MG PO TABS
10.0000 mg | ORAL_TABLET | Freq: Every day | ORAL | Status: DC | PRN
  Administered 2021-08-25: 10 mg via ORAL
  Filled 2021-08-19: qty 1

## 2021-08-19 MED ORDER — GUAIFENESIN-DM 100-10 MG/5ML PO SYRP
5.0000 mL | ORAL_SOLUTION | ORAL | Status: DC | PRN
  Administered 2021-08-19 – 2021-08-20 (×6): 5 mL via ORAL
  Filled 2021-08-19 (×6): qty 5

## 2021-08-19 MED ORDER — ENOXAPARIN SODIUM 40 MG/0.4ML IJ SOSY
40.0000 mg | PREFILLED_SYRINGE | INTRAMUSCULAR | Status: DC
  Administered 2021-08-19 – 2021-08-20 (×2): 40 mg via SUBCUTANEOUS
  Filled 2021-08-19 (×2): qty 0.4

## 2021-08-19 MED ORDER — ALBUTEROL SULFATE (2.5 MG/3ML) 0.083% IN NEBU
2.5000 mg | INHALATION_SOLUTION | RESPIRATORY_TRACT | Status: DC | PRN
  Administered 2021-08-19 – 2021-08-20 (×4): 2.5 mg via RESPIRATORY_TRACT
  Filled 2021-08-19 (×4): qty 3

## 2021-08-19 NOTE — Progress Notes (Signed)
NP Foust made aware that pt having dyspnea on exertion, wheezing and rhonchi and cough, new order for breathing treatments and chest xray

## 2021-08-19 NOTE — Progress Notes (Signed)
NP foust made aware that pt is a yellow MEWs HR 106 temp 101.2, will continue to monitor VS

## 2021-08-19 NOTE — Progress Notes (Signed)
°   08/19/21 0733  Assess: MEWS Score  Temp (!) 101.2 F (38.4 C)  BP (!) 151/77  Pulse Rate (!) 106  Resp 19  SpO2 96 %  O2 Device Room Air  Assess: MEWS Score  MEWS Temp 1  MEWS Systolic 0  MEWS Pulse 1  MEWS RR 0  MEWS LOC 0  MEWS Score 2  MEWS Score Color Yellow  Assess: if the MEWS score is Yellow or Red  Were vital signs taken at a resting state? Yes  Focused Assessment No change from prior assessment  Does the patient meet 2 or more of the SIRS criteria? No  MEWS guidelines implemented *See Row Information* Yes  Take Vital Signs  Increase Vital Sign Frequency  Yellow: Q 2hr X 2 then Q 4hr X 2, if remains yellow, continue Q 4hrs  Escalate  MEWS: Escalate Yellow: discuss with charge nurse/RN and consider discussing with provider and RRT  Notify: Charge Nurse/RN  Name of Charge Nurse/RN Notified Caryl Pina RN  Date Charge Nurse/RN Notified 08/19/21  Time Charge Nurse/RN Notified [id number]  Notify: Provider  Provider Name/Title Ouida Sills  Date Provider Notified 08/19/21  Time Provider Notified [phone]  Notification Type Page  Notification Reason Change in status (yellow MEWS)  Assess: SIRS CRITERIA  SIRS Temperature  1  SIRS Pulse 1  SIRS Respirations  0  SIRS WBC 0  SIRS Score Sum  2

## 2021-08-19 NOTE — Progress Notes (Signed)
PROGRESS NOTE  Sarah [name]    DOB: 06/04/1956, 66 y.o.  ZOX:[id number]    Code Status: Full Code   DOA: 08/18/2021   LOS: 1   Brief hospital course  Sarah [name] is a 66 y.o. 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 faecalis UTI, after presenting with fever, and again in December 2022 with URI ruled out for sepsis.  They presented from home to the ED on 08/18/2021 with fever of 103, cough, sore throat, hoarseness x 2 days.  In the ED, it was found that they had sepsis with fever of 102.9, tachypnea, tachycardia and unknown source of infection but hx/px suspicious for pulmonary source.  They were treated with IV Abx and supportive care. I have messaged her heme/onc provider.   Patient was admitted to medicine service for further workup and management of sepsis as outlined in detail below.  08/19/21 -stable  Assessment & Plan  Principal Problem:   UTI (urinary tract infection) Active Problems:   Multiple myeloma not having achieved remission (HCC)   Type II diabetes mellitus with complication (HCC)   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[place])   Long term prescription opiate use   Severe aortic stenosis   Thrombocytopenia (HCC)   Sepsis (HCC)  Sepsis- criteria met on admission with Tmax 102.9, tachycardia, tachypnea, and suspected sources of infection of urine vs pulmonary vs oral flora. Patient describes dental procedures in the past couple months and current pain in left lower gum line. Chest xray negative, CTA showed signs of bronchitis. She is stable ORA. Suspect URVI, negative for flu/covid. - sepsis criteria have resolved. Vitals stable but still febrile.  Management as below - CTX (2/18-2/19) - broaden coverage to include oral flora - unasyn (2/19- - ibuprofen PRN for fever, reported allergy to tylenol - f/u blood cultures, NGTD  Multiple myeloma s/p autologous stem cell transplant 05/2020. Most recent heme/onc appointment 2/1.    On maintenance chemotherapy - No acute issues   Type II diabetes mellitus with complication (HCC) most recent hgb A1c 7.5  - Sliding scale insulin coverage   Severe aortic stenosis, nonrheumatic- Followed at cardiology [facility] - No acute issues -Close monitoring given sepsis fluids   Spinal stenosis of lumbar region with neurogenic claudication - Continue tramadol  Thrombocytopenia (HCC), chronic - Platelet count 91,000, at baseline, probably related to chemotherapy   Chemotherapy-induced neuropathy (HCC) - Continue Lyrica  Chemotherapy-induced diarrhea - Continue loperamide    Body mass index is 28.47 kg/m.  VTE ppx: enoxaparin (LOVENOX) injection 40 mg Start: 08/19/21 0800   Diet:     Diet   Diet heart healthy/carb modified Room service appropriate? Yes; Fluid consistency: Thin   Subjective 08/19/21    Pt reports feeling "like crap". She continues to have cough and congestion.    Objective   Vitals:   08/19/21 0009 08/19/21 0103 08/19/21 0116 08/19/21 0409  BP: 132/67  (!) 141/70 (!) 151/77  Pulse: 95  97 (!) 109  Resp: 18  16 18   Temp: 99.1 F (37.3 C)  98.5 F (36.9 C) 100.3 F (37.9 C)  TempSrc: Oral  Oral Oral  SpO2: 96%  99% 97%  Weight:  70.6 kg    Height:  5' 2"  (  1.575 m)      Intake/Output Summary (Last 24 hours) at 08/19/2021 0658 Last data filed at 08/19/2021 0257 Gross per 24 hour  Intake 2278.85 ml  Output --  Net 2278.85 ml   Filed Weights   08/18/21 1723 08/19/21 0103  Weight: 72.6 kg 70.6 kg     Physical Exam:  General: awake, alert, NAD HEENT: atraumatic, clear conjunctiva, anicteric sclera, MMM, hearing grossly normal Positive clear  rhinorrhea bilaterally with edema/erythema of nares. Positive tenderness with palpation of left lower gums, no dentition, no abscess, no superficial infection.  Respiratory: normal respiratory effort. Intermittent productive cough Cardiovascular: normal S1/S2, RRR, no JVD, murmurs, quick capillary refill  Gastrointestinal: soft, NT, ND Nervous: A&O x3. no gross focal neurologic deficits, normal speech Extremities: moves all equally, no edema, normal tone Skin: dry, intact, normal temperature, normal color. No rashes, lesions or ulcers on exposed skin Psychiatry: normal mood, congruent affect  Labs   I have personally reviewed the following labs and imaging studies CBC    Component Value Date/Time   WBC 7.7 08/18/2021 1731   RBC 4.44 08/18/2021 1731   HGB 12.1 08/18/2021 1731   HCT 35.9 (L) 08/18/2021 1731   PLT 93 (L) 08/18/2021 1731   MCV 80.9 08/18/2021 1731   MCH 27.3 08/18/2021 1731   MCHC 33.7 08/18/2021 1731   RDW 14.6 08/18/2021 1731   LYMPHSABS 1.0 08/18/2021 1731   MONOABS 0.5 08/18/2021 1731   EOSABS 0.1 08/18/2021 1731   BASOSABS 0.0 08/18/2021 1731   BMP Latest Ref Rng & Units 08/18/2021 08/01/2021 07/19/2021  Glucose 70 - 99 mg/dL 146(H) 161(H) 171(H)  BUN 8 - 23 mg/dL 23 23 21   Creatinine 0.44 - 1.00 mg/dL 1.12(H) 1.09(H) 0.83  BUN/Creat Ratio 9 - 23 - - -  Sodium 135 - 145 mmol/L 130(L) 135 137  Potassium 3.5 - 5.1 mmol/L 3.9 3.9 4.1  Chloride 98 - 111 mmol/L 99 105 105  CO2 22 - 32 mmol/L 20(L) 22 25  Calcium 8.9 - 10.3 mg/dL 8.8(L) 9.1 8.7(L)    DG Chest 2 View  Result Date: 08/18/2021 CLINICAL DATA:  Shortness of breath, cough, fever, suspected sepsis, multiple myeloma EXAM: CHEST - 2 VIEW COMPARISON:  08/17/2021 FINDINGS: The heart size and mediastinal contours are within normal limits. Both lungs are clear. Disc degenerative disease of the thoracic spine. IMPRESSION: No acute abnormality of the lungs. Electronically Signed   By: Delanna Ahmadi M.D.   On:  08/18/2021 18:13   DG Chest 2 View  Result Date: 08/17/2021 CLINICAL DATA:  Cough and low-grade fever. Multiple myeloma. Port in place. EXAM: CHEST - 2 VIEW COMPARISON:  06/23/2021 FINDINGS: Right chest wall porta catheter tip overlies the superior vena cava/right atrial junction. Cardiac silhouette and mediastinal contours are within normal limits. The lungs are clear. No pleural effusion or pneumothorax. Mild-to-moderate multilevel degenerative disc changes of the thoracic spine. Cholecystectomy clips. IMPRESSION: No active cardiopulmonary disease. Electronically Signed   By: Yvonne Kendall M.D.   On: 08/17/2021 13:56   CT Angio Chest PE W and/or Wo Contrast  Result Date: 08/18/2021 CLINICAL DATA:  Pulmonary embolism suspected, high probability. Shortness of breath and fever beginning 3 days ago. EXAM: CT ANGIOGRAPHY CHEST WITH CONTRAST TECHNIQUE: Multidetector CT imaging of the chest was performed using the standard protocol during bolus administration of intravenous contrast. Multiplanar CT image reconstructions and MIPs were obtained to evaluate the vascular anatomy. RADIATION DOSE REDUCTION: This exam was performed according to the departmental dose-optimization program  which includes automated exposure control, adjustment of the mA and/or kV according to patient size and/or use of iterative reconstruction technique. CONTRAST:  43m OMNIPAQUE IOHEXOL 350 MG/ML SOLN COMPARISON:  Chest radiography same day. Prior CT angiography 03/17/2020 FINDINGS: Cardiovascular: The heart is enlarged. No pericardial fluid. No visible coronary artery calcification or aortic atherosclerotic calcification. Pulmonary arterial opacification is moderate. No pulmonary emboli are seen. Mediastinum/Nodes: Chronic calcified mediastinal lymph nodes as noted previously. Lungs/Pleura: No pulmonary infiltrate, collapse or effusion. Patient does have some scattered areas of bronchial thickening. No pleural effusion. Upper Abdomen:  Fatty change of the liver. No focal lesion is visible. Previous cholecystectomy. Musculoskeletal: Negative Review of the MIP images confirms the above findings. IMPRESSION: No pulmonary emboli. Mild cardiomegaly as seen previously. Possible bronchitis pattern. No pulmonary consolidation or collapse. No pleural effusion. Chronic calcified mediastinal lymph nodes. Electronically Signed   By: MNelson ChimesM.D.   On: 08/18/2021 20:08    Disposition Plan & Communication  Patient status: Inpatient  Admitted From: Home Planned disposition location: Home Anticipated discharge date: ~2/21 pending clinical improvement  Family Communication: none     Author: CRicharda Osmond DO [facility] 08/19/2021, 6:58 AM   Available by Epic secure chat 7AM-7PM. If 7PM-7AM, please contact night-coverage.  [facility] contact information found on [email or web address]

## 2021-08-19 NOTE — ED Notes (Signed)
Request made for transport to the floor ?

## 2021-08-19 NOTE — Progress Notes (Signed)
°   08/19/21 2104  Assess: MEWS Score  Temp (!) 102.5 F (39.2 C)  BP (!) 151/93  Pulse Rate (!) 103  Resp 20  SpO2 98 %  Assess: MEWS Score  MEWS Temp 2  MEWS Systolic 0  MEWS Pulse 1  MEWS RR 0  MEWS LOC 0  MEWS Score 3  MEWS Score Color Yellow  Assess: if the MEWS score is Yellow or Red  Were vital signs taken at a resting state? Yes  Focused Assessment Change from prior assessment (see assessment flowsheet)  Does the patient meet 2 or more of the SIRS criteria? Yes  Does the patient have a confirmed or suspected source of infection? Yes  Provider and Rapid Response Notified? No  MEWS guidelines implemented *See Row Information* Yes  Treat  MEWS Interventions Administered prn meds/treatments;Escalated (See documentation below)  Pain Scale 0-10  Pain Score 0  Take Vital Signs  Increase Vital Sign Frequency  Yellow: Q 2hr X 2 then Q 4hr X 2, if remains yellow, continue Q 4hrs  Escalate  MEWS: Escalate Yellow: discuss with charge nurse/RN and consider discussing with provider and RRT  Notify: Charge Nurse/RN  Name of Charge Nurse/RN Notified Orvil Feil RN  Date Charge Nurse/RN Notified 08/19/21  Time Charge Nurse/RN Notified 2143  Notify: Provider  Provider Name/Title Foust NP  Date Provider Notified 08/19/21  Time Provider Notified 2143  Notification Type Page  Notification Reason Change in status  Provider response No new orders  Date of Provider Response 08/19/21  Time of Provider Response 2147  Document  Patient Outcome Other (Comment) (continue to monitor)  Progress note created (see row info) Yes  Assess: SIRS CRITERIA  SIRS Temperature  1  SIRS Pulse 1  SIRS Respirations  0  SIRS WBC 0  SIRS Score Sum  2

## 2021-08-19 NOTE — TOC CM/SW Note (Signed)
°  Transition of Care [facility]) Screening Note   Patient Details  Name: Sarah [name] Date of Birth: Jul 30, 1955   [facility]) CM/SW Contact:    Magnus Ivan, LCSW Phone Number: 08/19/2021, 12:42 PM    Transition of Care Department [facility]) has reviewed patient and no TOC needs have been identified at this time. We will continue to monitor patient advancement through interdisciplinary progression rounds. If new patient transition needs arise, please place a TOC consult.

## 2021-08-20 ENCOUNTER — Inpatient Hospital Stay: Payer: Medicare HMO

## 2021-08-20 ENCOUNTER — Other Ambulatory Visit: Payer: Self-pay

## 2021-08-20 DIAGNOSIS — J988 Other specified respiratory disorders: Secondary | ICD-10-CM

## 2021-08-20 DIAGNOSIS — N390 Urinary tract infection, site not specified: Secondary | ICD-10-CM

## 2021-08-20 DIAGNOSIS — B9789 Other viral agents as the cause of diseases classified elsewhere: Secondary | ICD-10-CM

## 2021-08-20 DIAGNOSIS — J40 Bronchitis, not specified as acute or chronic: Secondary | ICD-10-CM | POA: Diagnosis not present

## 2021-08-20 DIAGNOSIS — Z9484 Stem cells transplant status: Secondary | ICD-10-CM

## 2021-08-20 DIAGNOSIS — C9001 Multiple myeloma in remission: Secondary | ICD-10-CM

## 2021-08-20 DIAGNOSIS — D696 Thrombocytopenia, unspecified: Secondary | ICD-10-CM

## 2021-08-20 DIAGNOSIS — R768 Other specified abnormal immunological findings in serum: Secondary | ICD-10-CM | POA: Diagnosis not present

## 2021-08-20 DIAGNOSIS — A419 Sepsis, unspecified organism: Secondary | ICD-10-CM

## 2021-08-20 DIAGNOSIS — R509 Fever, unspecified: Secondary | ICD-10-CM | POA: Diagnosis not present

## 2021-08-20 LAB — CBC
HCT: 30 % — ABNORMAL LOW (ref 36.0–46.0)
Hemoglobin: 9.7 g/dL — ABNORMAL LOW (ref 12.0–15.0)
MCH: 26.8 pg (ref 26.0–34.0)
MCHC: 32.3 g/dL (ref 30.0–36.0)
MCV: 82.9 fL (ref 80.0–100.0)
Platelets: 68 10*3/uL — ABNORMAL LOW (ref 150–400)
RBC: 3.62 MIL/uL — ABNORMAL LOW (ref 3.87–5.11)
RDW: 14.7 % (ref 11.5–15.5)
WBC: 6.1 10*3/uL (ref 4.0–10.5)
nRBC: 0 % (ref 0.0–0.2)

## 2021-08-20 LAB — GLUCOSE, CAPILLARY
Glucose-Capillary: 126 mg/dL — ABNORMAL HIGH (ref 70–99)
Glucose-Capillary: 119 mg/dL — ABNORMAL HIGH (ref 70–99)
Glucose-Capillary: 153 mg/dL — ABNORMAL HIGH (ref 70–99)
Glucose-Capillary: 219 mg/dL — ABNORMAL HIGH (ref 70–99)

## 2021-08-20 LAB — TROPONIN I (HIGH SENSITIVITY)
Troponin I (High Sensitivity): 47 ng/L — ABNORMAL HIGH (ref <18)
Troponin I (High Sensitivity): 216 ng/L (ref <18)
Troponin I (High Sensitivity): 1156 ng/L (ref <18)
Troponin I (High Sensitivity): 819 ng/L (ref <18)

## 2021-08-20 LAB — URINE CULTURE

## 2021-08-20 LAB — IMMATURE PLATELET FRACTION: Immature Platelet Fraction: 6.2 % (ref 1.2–8.6)

## 2021-08-20 LAB — PATHOLOGIST SMEAR REVIEW

## 2021-08-20 MED ORDER — METHYLPREDNISOLONE SODIUM SUCC 125 MG IJ SOLR
80.0000 mg | INTRAMUSCULAR | Status: DC
  Administered 2021-08-20: 19:00:00 80 mg via INTRAVENOUS
  Filled 2021-08-20: qty 2

## 2021-08-20 MED ORDER — ASPIRIN 325 MG PO TABS
325.0000 mg | ORAL_TABLET | Freq: Once | ORAL | Status: AC
  Administered 2021-08-21: 01:00:00 325 mg via ORAL
  Filled 2021-08-20: qty 1

## 2021-08-20 MED ORDER — IPRATROPIUM-ALBUTEROL 0.5-2.5 (3) MG/3ML IN SOLN: 3.0000 mL | Freq: Four times a day (QID) | RESPIRATORY_TRACT | Status: DC

## 2021-08-20 MED ORDER — LORAZEPAM 2 MG/ML IJ SOLN
1.0000 mg | Freq: Once | INTRAMUSCULAR | Status: AC
  Administered 2021-08-20: 1 mg via INTRAVENOUS
  Filled 2021-08-20: qty 1

## 2021-08-20 MED ORDER — SIMETHICONE 80 MG PO CHEW
160.0000 mg | CHEWABLE_TABLET | Freq: Four times a day (QID) | ORAL | Status: AC
  Administered 2021-08-20 – 2021-08-21 (×3): 160 mg via ORAL
  Filled 2021-08-20 (×4): qty 2

## 2021-08-20 MED ORDER — ALPRAZOLAM 0.5 MG PO TABS
0.5000 mg | ORAL_TABLET | Freq: Two times a day (BID) | ORAL | Status: DC | PRN
  Administered 2021-08-20: 17:00:00 0.5 mg via ORAL
  Filled 2021-08-20: qty 1

## 2021-08-20 MED ORDER — METOPROLOL TARTRATE 25 MG PO TABS
12.5000 mg | ORAL_TABLET | Freq: Two times a day (BID) | ORAL | Status: DC
  Administered 2021-08-21 – 2021-08-22 (×4): 12.5 mg via ORAL
  Filled 2021-08-20 (×5): qty 1

## 2021-08-20 MED ORDER — FUROSEMIDE 10 MG/ML IJ SOLN
40.0000 mg | Freq: Once | INTRAMUSCULAR | Status: AC
Start: 2021-08-20 — End: 2021-08-20
  Administered 2021-08-20: 40 mg via INTRAVENOUS
  Filled 2021-08-20: qty 4

## 2021-08-20 MED ORDER — DM-GUAIFENESIN ER 30-600 MG PO TB12
1.0000 | ORAL_TABLET | Freq: Two times a day (BID) | ORAL | Status: DC
  Administered 2021-08-20 – 2021-08-25 (×10): 1 via ORAL
  Filled 2021-08-20 (×10): qty 1

## 2021-08-20 MED ORDER — TRAZODONE HCL 100 MG PO TABS
100.0000 mg | ORAL_TABLET | Freq: Every day | ORAL | Status: DC
  Administered 2021-08-21 – 2021-08-24 (×4): 100 mg via ORAL
  Filled 2021-08-20: qty 1
  Filled 2021-08-20: qty 2
  Filled 2021-08-20 (×3): qty 1

## 2021-08-20 MED ORDER — PANTOPRAZOLE SODIUM 40 MG PO TBEC
80.0000 mg | DELAYED_RELEASE_TABLET | Freq: Every day | ORAL | Status: DC
  Administered 2021-08-20 – 2021-08-25 (×6): 80 mg via ORAL
  Filled 2021-08-20 (×6): qty 2

## 2021-08-20 MED ORDER — IPRATROPIUM-ALBUTEROL 0.5-2.5 (3) MG/3ML IN SOLN
3.0000 mL | Freq: Four times a day (QID) | RESPIRATORY_TRACT | Status: DC
  Administered 2021-08-20 – 2021-08-24 (×15): 3 mL via RESPIRATORY_TRACT
  Filled 2021-08-20 (×17): qty 3

## 2021-08-20 MED ORDER — IMMUNE GLOBULIN (HUMAN) 10 GM/100ML IV SOLN
400.0000 mg/kg | Freq: Once | INTRAVENOUS | Status: AC
  Administered 2021-08-20: 30 g via INTRAVENOUS
  Filled 2021-08-20: qty 300

## 2021-08-20 MED ORDER — FAMOTIDINE IN NACL 20-0.9 MG/50ML-% IV SOLN
20.0000 mg | Freq: Once | INTRAVENOUS | Status: AC
  Administered 2021-08-20: 19:00:00 20 mg via INTRAVENOUS
  Filled 2021-08-20: qty 50

## 2021-08-20 MED ORDER — DIPHENHYDRAMINE HCL 25 MG PO CAPS: 50.0000 mg | ORAL_CAPSULE | Freq: Once | ORAL | Status: DC | PRN

## 2021-08-20 MED ORDER — ADULT MULTIVITAMIN W/MINERALS CH
1.0000 | ORAL_TABLET | Freq: Every day | ORAL | Status: DC
  Administered 2021-08-20 – 2021-08-25 (×6): 1 via ORAL
  Filled 2021-08-20 (×7): qty 1

## 2021-08-20 MED ORDER — METHYLPREDNISOLONE SODIUM SUCC 40 MG IJ SOLR: 40.0000 mg | Freq: Two times a day (BID) | INTRAMUSCULAR | Status: DC

## 2021-08-20 NOTE — Progress Notes (Signed)
Foust NP aware of critical troponin 216

## 2021-08-20 NOTE — Consult Note (Addendum)
Hematology/Oncology Consult note [phone] [phone]      Patient Care Team: Glean Hess, MD as PCP - General (Internal Medicine) Jodell Cipro, MD as Referring Physician (Pain Medicine) Jennye Boroughs, MD as Consulting Physician (Cardiology) Earlie Server, MD as Consulting Physician (Oncology)   Name of the patient: Sarah [name]  [id number]  05-20-1956   Date of visit: 08/20/21 REASON FOR COSULTATION:  Fever, history of myeloma s/p BM transplant.  History of presenting illness-  66 y.o. female with PMH listed at below who presents to ER for evaluation of fever of 103 at home, cough, sore throat, hoarseness. Prior to her presentation, she has reported cough and low-grade fever.  Chest x-ray was negative.  And she was advised by me to take empiric Z-Pak for bronchitis treatments.  Patient took 1 dose of Z-Pak.  Fever is persistent and patient went to emergency room for further evaluation.  In the emergency room, she had an temperature of 102.9, pulse 118, oxygen saturation 98% on room air.  BP 146/83.  Lactic acid 0.9.  COVID and influenza negative.  Patient had a CT angio chest which showed no pulmonary embolism.  Mild cardiomegaly.  Possible bronchitis pattern. Patient was admitted for sepsis work-up and management, UTI  Patient has a history of multiple myeloma status post bone marrow transplant.  Multiple myeloma is currently in remission and patient is on maintenance daratumumab.  Chronic hypomagnesium on weekly IV magnesium treatments and oral magnesium treatments.  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  Her IgG level has been decreased since earlier this year and there was plan to arrange patient to get IVIG treatments.  She has a history of possible acute renal failure secondary to IVIG  treatments.    Patient has been covered with Unasyn since 08/19/2021.  Urine culture showed multiple species present. Blood culture showed no growth for 2 days. Hematology oncology was consulted for further evaluation and management. Today patient was seen the bedside. She continues to have cough, denies dysuria.    Review of Systems  Constitutional:  Positive for fever. Negative for appetite change, chills and fatigue.  HENT:   Negative for hearing loss and voice change.   Eyes:  Negative for eye problems.  Respiratory:  Positive for cough and shortness of breath. Negative for chest tightness.   Cardiovascular:  Negative for chest pain.  Gastrointestinal:  Negative for abdominal distention, abdominal pain and blood in stool.  Endocrine: Negative for hot flashes.  Genitourinary:  Negative for difficulty urinating and frequency.   Musculoskeletal:  Negative for arthralgias.  Skin:  Negative for itching and rash.  Neurological:  Negative for extremity weakness.  Hematological:  Negative for adenopathy.  Psychiatric/Behavioral:  Negative for confusion.    Allergies  Allergen Reactions   Oxycodone-Acetaminophen Anaphylaxis    Swelling and rash   Celebrex [Celecoxib] Diarrhea   Codeine    Plerixafor     In 2016 during ASCT collection patient developed fever to 103.72F and required hospitalization   Benadryl [Diphenhydramine] Palpitations   Morphine Itching and Rash   Ondansetron Diarrhea   Tylenol [Acetaminophen] Itching and Rash    Patient Active Problem List   Diagnosis Date Noted   Bronchitis    Fever    Acute gingivitis 06/24/2021   URI (upper respiratory infection) 06/23/2021   Chronic diarrhea 06/23/2021   Multiple myeloma in remission ([place])  05/14/2021   Leg swelling 04/02/2021   Neuropathy 04/02/2021   Fever in adult 03/05/2021   Frequent PVCs 12/04/2020   Diarrhea 10/25/2020   Encounter for antineoplastic chemotherapy 08/31/2020   Coronary artery disease involving  native coronary artery of native heart 04/26/2020   Yeast vaginitis 03/18/2020   Hypokalemia 03/11/2020   UTI (urinary tract infection) 03/03/2020   Dyspnea 03/02/2020   Physical deconditioning 02/07/2020   Impaired nutrition 02/07/2020   Sepsis ([place]) 11/13/2019   Encounter for antineoplastic immunotherapy 10/03/2019   Hypocalcemia 08/28/2019   Chronic nausea 12/17/2018   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/17/2018   Hyperlipidemia associated with type 2 diabetes mellitus ([place]) 11/25/2018   Anemia 10/21/2018   Hypomagnesemia 08/20/2018   Goals of care, counseling/discussion 08/20/2018   B12 deficiency 08/20/2018   Thrombocytopenia ([place]) 02/09/2018   Benign essential HTN 08/21/2017   Carotid artery stenosis, asymptomatic, bilateral 08/06/2017   Thyroid nodule 08/06/2017   Iron deficiency anemia due to chronic blood loss 05/13/2017   Spinal stenosis of lumbar region with neurogenic claudication 04/09/2017   Long term prescription opiate use 09/07/2016   Chronic pain syndrome 07/08/2016   Pain medication agreement signed 07/08/2016   Spondylosis of lumbar region without myelopathy or radiculopathy 07/08/2016   Severe aortic stenosis 06/05/2016   Environmental and seasonal allergies 04/19/2016   Insomnia 04/19/2016   Asthma 04/19/2016   Aortic regurgitation 04/19/2016   Type II diabetes mellitus with complication ([place]) 27/11/2374   Depression, major, single episode, in partial remission ([place]) 04/12/2016   Irritable bowel syndrome (IBS) 04/12/2016   Hepatic steatosis 04/12/2016   Multiple myeloma not having achieved remission ([place]) 04/02/2016   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[place]) 07/06/2015   Stem cell transplant candidate 05/16/2015     Past Medical History:  Diagnosis Date   Abnormal stress test 02/14/2016   Overview:  Added automatically from request for surgery [id number]   Anemia    Anxiety    Arthritis    Bicuspid aortic valve     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   Due to Zometa   Cardiomyopathy, idiopathic ([place]) 08/21/2017   Overview:  Septal and apical hypokinesis with ef 40%   CHF (congestive heart failure) (HCC)    CKD (chronic kidney disease) stage 3, GFR 30-59 ml/min (HCC)    Depression    Diabetes mellitus (HCC)    Dizziness    Fatty liver    Frequent falls    GERD (gastroesophageal reflux disease)    Gout    Heart murmur    History of blood transfusion    History of bone marrow transplant ([place])    History of uterine fibroid    Hx of cardiac catheterization 06/05/2016   Overview:  Normal coronaries 2017   Hypertension    Hypomagnesemia    IDA (iron deficiency anemia)    Multiple myeloma ([place])    Personal history of chemotherapy    Renal cyst    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[place]) 06/05/2016   Overview:  Sp ablation 1999     Past Surgical History:  Procedure Laterality Date   ABDOMINAL HYSTERECTOMY     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   CARDIAC ELECTROPHYSIOLOGY MAPPING AND ABLATION     CARPAL TUNNEL RELEASE Bilateral    CHOLECYSTECTOMY  2008   COLONOSCOPY WITH PROPOFOL N/A 05/07/2017   Procedure: COLONOSCOPY WITH PROPOFOL;  Surgeon: [name], MD;  Location[facility] ENDOSCOPY;  Service: Gastroenterology;  Laterality: N/A;   COLONOSCOPY WITH PROPOFOL N/A  12/19/2020   Procedure: COLONOSCOPY WITH PROPOFOL;  Surgeon: [name], MD;  Location[facility] ENDOSCOPY;  Service: Gastroenterology;  Laterality: N/A;   ESOPHAGOGASTRODUODENOSCOPY (EGD) WITH PROPOFOL N/A 05/07/2017   Procedure: ESOPHAGOGASTRODUODENOSCOPY (EGD) WITH PROPOFOL;  Surgeon: [name], MD;  Location: [facility] ENDOSCOPY;  Service: Gastroenterology;  Laterality: N/A;   FOOT SURGERY Bilateral    INCONTINENCE SURGERY  2009   INTERSTIM IMPLANT PLACEMENT     other     over active bladder   OTHER SURGICAL HISTORY     bladder stimulator    PARTIAL HYSTERECTOMY  03/1996   fibroids   PORTA CATH INSERTION N/A  03/10/2019   Procedure: PORTA CATH INSERTION;  Surgeon: Algernon Huxley, MD;  Location: [facility] LAB;  Service: Cardiovascular;  Laterality: N/A;   TONSILLECTOMY  2007    Social History   Socioeconomic History   Marital status: Widowed    Spouse name: Not on file   Number of children: Not on file   Years of education: Not on file   Highest education level: Not on file  Occupational History   Occupation: Disabled  Tobacco Use   Smoking status: Former    Packs/day: 1.00    Years: 20.00    Pack years: 20.00    Types: Cigarettes    Quit date: 07/02/1991    Years since quitting: 30.1   Smokeless tobacco: Never  Vaping Use   Vaping Use: Never used  Substance and Sexual Activity   Alcohol use: Yes    Comment: occasionally   Drug use: No   Sexual activity: Never  Other Topics Concern   Not on file  Social History Narrative   Pt lives with her 2 sisters   Social Determinants of Health   Financial Resource Strain: Not on file  Food Insecurity: Not on file  Transportation Needs: Not on file  Physical Activity: Not on file  Stress: Not on file  Social Connections: Not on file  Intimate Partner Violence: Not on file     Family History  Problem Relation Age of Onset   Colon cancer Father    Renal Disease Father    Diabetes Mellitus II Father    Melanoma Paternal Grandmother    Breast cancer Maternal Aunt 75   Anemia Mother    Heart disease Mother    Heart failure Mother    Renal Disease Mother    Congestive Heart Failure Mother    Heart disease Maternal Uncle    Throat cancer Maternal Uncle    Lung cancer Maternal Uncle    Liver disease Maternal Uncle    Heart failure Maternal Uncle    Hearing loss Son 10       Suicide      Current Facility-Administered Medications:    0.9 %  sodium chloride infusion, , Intravenous, Continuous, Anwar, Shayan S, DO, Last Rate: 125 mL/hr at 08/20/21 0353, New Bag at 08/20/21 0353   acyclovir (ZOVIRAX) 200 MG capsule 400 mg, 400  mg, Oral, BID, Doristine Mango L, MD, 400 mg at 08/20/21 0941   albuterol (PROVENTIL) (2.5 MG/3ML) 0.083% nebulizer solution 2.5 mg, 2.5 mg, Nebulization, Q4H PRN, Foust, Katy L, NP, 2.5 mg at 08/20/21 0913   Ampicillin-Sulbactam (UNASYN) 3 g in sodium chloride 0.9 % 100 mL IVPB, 3 g, Intravenous, Q6H, Doristine Mango L, MD, Last Rate: 200 mL/hr at 08/20/21 0931, 3 g at 08/20/21 0931   benzonatate (TESSALON) capsule 200 mg, 200 mg, Oral, TID PRN, Foust, Andrey Farmer, NP,  200 mg at 08/20/21 5329   Chlorhexidine Gluconate Cloth 2 % PADS 6 each, 6 each, Topical, Daily, Richarda Osmond, MD, 6 each at 08/20/21 0932   diclofenac Sodium (VOLTAREN) 1 % topical gel 2 g, 2 g, Topical, QID PRN, Richarda Osmond, MD   diphenhydrAMINE (BENADRYL) capsule 50 mg, 50 mg, Oral, Once PRN, Earlie Server, MD   enoxaparin (LOVENOX) injection 40 mg, 40 mg, Subcutaneous, Q24H, Athena Masse, MD, 40 mg at 08/20/21 9242   FLUoxetine (PROZAC) capsule 40 mg, 40 mg, Oral, Daily, Richarda Osmond, MD, 40 mg at 08/20/21 0916   guaiFENesin-dextromethorphan (ROBITUSSIN DM) 100-10 MG/5ML syrup 5 mL, 5 mL, Oral, Q4H PRN, Athena Masse, MD, 5 mL at 08/20/21 1030   ibuprofen (ADVIL) tablet 400 mg, 400 mg, Oral, Q4H PRN, Richarda Osmond, MD, 400 mg at 08/20/21 6834   Immune Globulin 10% (PRIVIGEN) IV infusion 30 g, 400 mg/kg, Intravenous, Once, Earlie Server, MD   insulin aspart (novoLOG) injection 0-15 Units, 0-15 Units, Subcutaneous, TID WC, Athena Masse, MD, 2 Units at 08/20/21 1962   loperamide (IMODIUM) capsule 2 mg, 2 mg, Oral, BID PRN, Richarda Osmond, MD, 2 mg at 08/19/21 2029   menthol-cetylpyridinium (CEPACOL) lozenge 3 mg, 1 lozenge, Oral, PRN, Athena Masse, MD, 3 mg at 08/19/21 1708   montelukast (SINGULAIR) tablet 10 mg, 10 mg, Oral, Daily PRN, Richarda Osmond, MD   multivitamin with minerals tablet 1 tablet, 1 tablet, Oral, Daily, Imagene Sheller S, DO, 1 tablet at 08/20/21 0916   ondansetron (ZOFRAN)  tablet 4 mg, 4 mg, Oral, Q6H PRN **OR** ondansetron (ZOFRAN) injection 4 mg, 4 mg, Intravenous, Q6H PRN, Athena Masse, MD   pantoprazole (PROTONIX) EC tablet 80 mg, 80 mg, Oral, Daily, Imagene Sheller S, DO, 80 mg at 08/20/21 0916   promethazine (PHENERGAN) tablet 12.5 mg, 12.5 mg, Oral, Q6H PRN, Richarda Osmond, MD, 12.5 mg at 08/20/21 1030   sodium chloride flush (NS) 0.9 % injection 10-40 mL, 10-40 mL, Intracatheter, PRN, Richarda Osmond, MD   tiZANidine (ZANAFLEX) tablet 2 mg, 2 mg, Oral, Q6H PRN, Richarda Osmond, MD, 2 mg at 08/20/21 1141   traZODone (DESYREL) tablet 100 mg, 100 mg, Oral, QHS, Anwar, Shayan S, DO  Facility-Administered Medications Ordered in Other Encounters:    sodium chloride flush (NS) 0.9 % injection 10 mL, 10 mL, Intravenous, PRN, Earlie Server, MD, 10 mL at 04/02/21 0904   Physical exam:  Vitals:   08/20/21 0557 08/20/21 0755 08/20/21 0913 08/20/21 1206  BP: (!) 150/82 140/82  (!) 142/89  Pulse: 97 (!) 49  95  Resp: 18 16  17   Temp: 98.9 F (37.2 C) 99.9 F (37.7 C)  99.5 F (37.5 C)  TempSrc: Oral Oral  Oral  SpO2: 94% 96% 96% 98%  Weight:      Height:       Physical Exam Constitutional:      General: She is not in acute distress.    Appearance: She is not diaphoretic.  HENT:     Head: Normocephalic and atraumatic.     Nose: Nose normal.     Mouth/Throat:     Pharynx: No oropharyngeal exudate.  Eyes:     General: No scleral icterus.    Pupils: Pupils are equal, round, and reactive to light.  Cardiovascular:     Rate and Rhythm: Normal rate and regular rhythm.     Heart sounds: No murmur heard. Pulmonary:  Effort: Pulmonary effort is normal. No respiratory distress.     Breath sounds: Rhonchi present.  Abdominal:     General: There is no distension.     Palpations: Abdomen is soft.     Tenderness: There is no abdominal tenderness.  Musculoskeletal:        General: Normal range of motion.     Cervical back: Normal range of motion  and neck supple.  Skin:    General: Skin is warm and dry.     Findings: No erythema.  Neurological:     Mental Status: She is alert and oriented to person, place, and time.     Cranial Nerves: No cranial nerve deficit.     Motor: No abnormal muscle tone.     Coordination: Coordination normal.  Psychiatric:        Mood and Affect: Affect normal.        CMP Latest Ref Rng & Units 08/18/2021  Glucose 70 - 99 mg/dL 146(H)  BUN 8 - 23 mg/dL 23  Creatinine 0.44 - 1.00 mg/dL 1.12(H)  Sodium 135 - 145 mmol/L 130(L)  Potassium 3.5 - 5.1 mmol/L 3.9  Chloride 98 - 111 mmol/L 99  CO2 22 - 32 mmol/L 20(L)  Calcium 8.9 - 10.3 mg/dL 8.8(L)  Total Protein 6.5 - 8.1 g/dL 6.7  Total Bilirubin 0.3 - 1.2 mg/dL 0.5  Alkaline Phos 38 - 126 U/L 92  AST 15 - 41 U/L 37  ALT 0 - 44 U/L 57(H)   CBC Latest Ref Rng & Units 08/20/2021  WBC 4.0 - 10.5 K/uL 6.1  Hemoglobin 12.0 - 15.0 g/dL 9.7(L)  Hematocrit 36.0 - 46.0 % 30.0(L)  Platelets 150 - 400 K/uL 68(L)    RADIOGRAPHIC STUDIES: I have personally reviewed the radiological images as listed and agreed with the findings in the report. DG Chest 1 View  Result Date: 08/19/2021 CLINICAL DATA:  Dyspnea. EXAM: CHEST  1 VIEW COMPARISON:  August 18, 2021 FINDINGS: There is stable right-sided venous Port-A-Cath positioning. The heart size and mediastinal contours are within normal limits. Both lungs are clear. Multilevel degenerative changes are seen throughout the thoracic spine. IMPRESSION: No active disease. Electronically Signed   By: Virgina Norfolk M.D.   On: 08/19/2021 21:19   DG Chest 2 View  Result Date: 08/18/2021 CLINICAL DATA:  Shortness of breath, cough, fever, suspected sepsis, multiple myeloma EXAM: CHEST - 2 VIEW COMPARISON:  08/17/2021 FINDINGS: The heart size and mediastinal contours are within normal limits. Both lungs are clear. Disc degenerative disease of the thoracic spine. IMPRESSION: No acute abnormality of the lungs.  Electronically Signed   By: Delanna Ahmadi M.D.   On: 08/18/2021 18:13   DG Chest 2 View  Result Date: 08/17/2021 CLINICAL DATA:  Cough and low-grade fever. Multiple myeloma. Port in place. EXAM: CHEST - 2 VIEW COMPARISON:  06/23/2021 FINDINGS: Right chest wall porta catheter tip overlies the superior vena cava/right atrial junction. Cardiac silhouette and mediastinal contours are within normal limits. The lungs are clear. No pleural effusion or pneumothorax. Mild-to-moderate multilevel degenerative disc changes of the thoracic spine. Cholecystectomy clips. IMPRESSION: No active cardiopulmonary disease. Electronically Signed   By: Yvonne Kendall M.D.   On: 08/17/2021 13:56   CT Angio Chest PE W and/or Wo Contrast  Result Date: 08/18/2021 CLINICAL DATA:  Pulmonary embolism suspected, high probability. Shortness of breath and fever beginning 3 days ago. EXAM: CT ANGIOGRAPHY CHEST WITH CONTRAST TECHNIQUE: Multidetector CT imaging of the chest was performed using  the standard protocol during bolus administration of intravenous contrast. Multiplanar CT image reconstructions and MIPs were obtained to evaluate the vascular anatomy. RADIATION DOSE REDUCTION: This exam was performed according to the departmental dose-optimization program which includes automated exposure control, adjustment of the mA and/or kV according to patient size and/or use of iterative reconstruction technique. CONTRAST:  83m OMNIPAQUE IOHEXOL 350 MG/ML SOLN COMPARISON:  Chest radiography same day. Prior CT angiography 03/17/2020 FINDINGS: Cardiovascular: The heart is enlarged. No pericardial fluid. No visible coronary artery calcification or aortic atherosclerotic calcification. Pulmonary arterial opacification is moderate. No pulmonary emboli are seen. Mediastinum/Nodes: Chronic calcified mediastinal lymph nodes as noted previously. Lungs/Pleura: No pulmonary infiltrate, collapse or effusion. Patient does have some scattered areas of  bronchial thickening. No pleural effusion. Upper Abdomen: Fatty change of the liver. No focal lesion is visible. Previous cholecystectomy. Musculoskeletal: Negative Review of the MIP images confirms the above findings. IMPRESSION: No pulmonary emboli. Mild cardiomegaly as seen previously. Possible bronchitis pattern. No pulmonary consolidation or collapse. No pleural effusion. Chronic calcified mediastinal lymph nodes. Electronically Signed   By: MNelson ChimesM.D.   On: 08/18/2021 20:08   UKoreaAbdomen Complete  Result Date: 07/26/2021 CLINICAL DATA:  66year old female with abnormal LFTs. Prior cholecystectomy. EXAM: ABDOMEN ULTRASOUND COMPLETE COMPARISON:  CT Abdomen and Pelvis 03/05/2021, 10/16/2016. Renal ultrasound 01/24/2020. FINDINGS: Gallbladder: Surgically absent. Common bile duct: Diameter: 5 mm, normal. Liver: Echogenic liver (image 16). No discrete liver lesion. No intrahepatic biliary ductal dilatation. Portal vein is patent on color Doppler imaging with normal direction of blood flow towards the liver. IVC: No abnormality visualized. Pancreas: Visualized portion unremarkable. Spleen: Size and appearance within normal limits. Right Kidney: Length: 10.2 cm. Echogenicity within normal limits. No mass or hydronephrosis visualized. Left Kidney: Length: 9.3 cm. Small complex echogenic midpole lesion (image 60). Was not apparent on the 2021 ultrasound, but probably corresponds to a small 12 mm hypodense heterogeneous area on the CT last year, which was present in 2018. This is 11 x 15 x 17 mm by ultrasound. There is some comet tail artifact in this area by ultrasound now (image 64). No other left renal mass. No hydronephrosis. Preserved left renal cortical echogenicity. Abdominal aorta: No aneurysm visualized. Other findings: None. IMPRESSION: 1. Evidence of hepatic steatosis. Absent gallbladder no evidence of bile duct obstruction. 2. Small up to 1.7 cm complex, mixed echogenic lesion of the left kidney.  This might reflect dystrophic calcification of a chronic cyst seen previously by CT, uncertain. But it was not apparent by prior renal ultrasound, most recently in 2021. Recommend dedicated Renal Mass Protocol Abdomen MRI or CT (without and with contrast) to best characterize. Electronically Signed   By: HGenevie AnnM.D.   On: 07/26/2021 11:23    Assessment and plan-   Sepsis, likely secondary to upper respiratory infection/bronchitis.  Urine culture is nondiagnostic.  She is not symptomatic.  Continue cover with broad-spectrum antibiotics. Appreciate infectious disease recommendation. 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  I was planning to give IVIG outpatient.  Discussed with patient she is in agreement.  Will give her a dose during this admission.  Prefer IVIG Privigen 4060mkg x 1.  Continue IV fluid hydration.  Multiple myeloma status post bone marrow transplant. Myeloma has been in remission and she is on daratumumab injection maintenance.  Avoid NSAIDs.  She has had contrast study during this admission.  Recommend to close monitor kidney function.  Acute on chronic thrombocytopenia, likely secondary to acute infection.  Continue to  monitor.  Check smear, immature platelet fraction.   Thank you for allowing me to participate in the care of this patient.    Earlie Server, MD, PhD Hematology Oncology 08/20/2021

## 2021-08-20 NOTE — Progress Notes (Signed)
Foust NP made aware of troponin 819 and 1156

## 2021-08-20 NOTE — Progress Notes (Addendum)
° °      CROSS COVER NOTE  NAME: Geriann Lafont MRN: [id number] DOB : April 24, 1956  Secure chat received from RN reporting Critical Troponin of 216 patient is currently chest pain free, continues to have intermittent shortness of breath. Patient initially reported chest tightness at 530PM yesterday and workup began by day team.  Plan: Aspirin 325mg   Nitro SL PRN  Heparin Drip per pharmacy Metoprolol 12.5mg  po BID,  Trending troponin 47--> 216-->819-->1156-->1569-->2239 BNP, TSH, Lipid Panel, and A1c  Spoke with Cardiology who agreed with plan and recommended ECHO in the AM.  Josephina Shih, MSN, FNP-BC Nurse Practitioner [facility] Pager [phone]

## 2021-08-20 NOTE — Consult Note (Signed)
NAME: Sarah [name]  DOB: 18-Aug-1955  MRN: [id number]  Date/Time: 08/20/2021 11:20 AM  REQUESTING PROVIDER: Dr. Rowe Pavy Subjective:  REASON FOR CONSULT: fever Extensive review of chart.  Including Care Everywhere and epic.  Spoke to patient and her niece at bedside.   Sarah [name] is a 66 y.o. female with a history of multiple myeloma status post  2 auto-HCT. 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 Presents to the hospital with fever for 3 days duration. Also has a cough, chest tightness and shortness of breath Patient lives with her niece and her 38-year-old daughter and sister.  The 36-year-old child had cough and some respiratory illness prior to the patient getting sick.   was recently hospitalized I 06/19/2021   for fever with sore gums after dental procedure Was treated like a UTI but she was not symptomatic 05/01/2021 patient sister had flu and patient became sick and received.tamiflu   03/05/2021  community-acquired pneumonia and enterococcus TI  4/22 cough and otitis media treated for 7 days with Augmentin She is followed by Dr. Tasia Catchings.  She saw Infectious Disease physician at [facility] on 08/08/2021 and they recommended IVIG monthly transfusions  On 08/09/2021 she received transforaminal epidural steroid injection for lumbar radiculitis, stenosis with neurogenic claudication.  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 HFpEF SA node dysfunction status post ablation   Past Surgical History:  Procedure Laterality Date   ABDOMINAL HYSTERECTOMY     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    CARDIAC ELECTROPHYSIOLOGY MAPPING AND ABLATION     CARPAL TUNNEL RELEASE Bilateral    CHOLECYSTECTOMY  2008   COLONOSCOPY WITH PROPOFOL N/A 05/07/2017   Procedure: COLONOSCOPY WITH PROPOFOL;  Surgeon: [name], MD;  Location: [facility] ENDOSCOPY;  Service: Gastroenterology;  Laterality: N/A;   COLONOSCOPY WITH PROPOFOL N/A 12/19/2020   Procedure: COLONOSCOPY WITH PROPOFOL;  Surgeon: [name], MD;  Location: [facility] ENDOSCOPY;  Service: Gastroenterology;  Laterality: N/A;   ESOPHAGOGASTRODUODENOSCOPY (EGD) WITH PROPOFOL N/A 05/07/2017   Procedure: ESOPHAGOGASTRODUODENOSCOPY (EGD) WITH PROPOFOL;  Surgeon: [name], MD;  Location: [facility] ENDOSCOPY;  Service: Gastroenterology;  Laterality: N/A;   FOOT SURGERY Bilateral    INCONTINENCE SURGERY  2009   INTERSTIM IMPLANT PLACEMENT     other     over active bladder   OTHER SURGICAL HISTORY     bladder stimulator    PARTIAL HYSTERECTOMY  03/1996   fibroids   PORTA CATH INSERTION N/A 03/10/2019   Procedure: PORTA CATH INSERTION;  Surgeon: Algernon Huxley, MD;  Location: [facility];  Service: Cardiovascular;  Laterality: N/A;   TONSILLECTOMY  2007    Social History   Socioeconomic History   Marital status: Widowed    Spouse name: Not on file   Number of children: Not on file   Years of education: Not on file   Highest education level: Not on file  Occupational History   Occupation: Disabled  Tobacco Use   Smoking status: Former    Packs/day: 1.00    Years: 20.00    Pack years: 20.00    Types: Cigarettes    Quit date: 07/02/1991    Years since quitting: 30.1   Smokeless  tobacco: Never  Vaping Use   Vaping Use: Never used  Substance and Sexual Activity   Alcohol use: Yes    Comment: occasionally   Drug use: No   Sexual activity: Never  Other Topics Concern   Not on file  Social History Narrative   Pt lives with her 2 sisters   Social Determinants of Health   Financial Resource Strain: Not on file  Food Insecurity: Not on  file  Transportation Needs: Not on file  Physical Activity: Not on file  Stress: Not on file  Social Connections: Not on file  Intimate Partner Violence: Not on file    Family History  Problem Relation Age of Onset   Colon cancer Father    Renal Disease Father    Diabetes Mellitus II Father    Melanoma Paternal Grandmother    Breast cancer Maternal Aunt 62   Anemia Mother    Heart disease Mother    Heart failure Mother    Renal Disease Mother    Congestive Heart Failure Mother    Heart disease Maternal Uncle    Throat cancer Maternal Uncle    Lung cancer Maternal Uncle    Liver disease Maternal Uncle    Heart failure Maternal Uncle    Hearing loss Son 18       Suicide    Allergies  Allergen Reactions   Oxycodone-Acetaminophen Anaphylaxis    Swelling and rash   Celebrex [Celecoxib] Diarrhea   Codeine    Plerixafor     In 2016 during ASCT collection patient developed fever to 103.86F and required hospitalization   Benadryl [Diphenhydramine] Palpitations   Morphine Itching and Rash   Ondansetron Diarrhea   Tylenol [Acetaminophen] Itching and Rash   I? Current Facility-Administered Medications  Medication Dose Route Frequency Provider Last Rate Last Admin   0.9 %  sodium chloride infusion   Intravenous Continuous Athena Masse, MD 125 mL/hr at 08/20/21 0353 New Bag at 08/20/21 0353   acyclovir (ZOVIRAX) 200 MG capsule 400 mg  400 mg Oral BID Richarda Osmond, MD   400 mg at 08/20/21 0941   albuterol (PROVENTIL) (2.5 MG/3ML) 0.083% nebulizer solution 2.5 mg  2.5 mg Nebulization Q4H PRN Foust, Katy L, NP   2.5 mg at 08/20/21 0913   Ampicillin-Sulbactam (UNASYN) 3 g in sodium chloride 0.9 % 100 mL IVPB  3 g Intravenous Q6H Doristine Mango L, MD 200 mL/hr at 08/20/21 0931 3 g at 08/20/21 0931   benzonatate (TESSALON) capsule 200 mg  200 mg Oral TID PRN Neomia Glass L, NP   200 mg at 08/20/21 0354   Chlorhexidine Gluconate Cloth 2 % PADS 6 each  6 each Topical Daily  Richarda Osmond, MD   6 each at 08/20/21 0932   diclofenac Sodium (VOLTAREN) 1 % topical gel 2 g  2 g Topical QID PRN Richarda Osmond, MD       enoxaparin (LOVENOX) injection 40 mg  40 mg Subcutaneous Q24H Judd Gaudier V, MD   40 mg at 08/20/21 0917   FLUoxetine (PROZAC) capsule 40 mg  40 mg Oral Daily Richarda Osmond, MD   40 mg at 08/20/21 0916   guaiFENesin-dextromethorphan (ROBITUSSIN DM) 100-10 MG/5ML syrup 5 mL  5 mL Oral Q4H PRN Athena Masse, MD   5 mL at 08/20/21 1030   ibuprofen (ADVIL) tablet 400 mg  400 mg Oral Q4H PRN Richarda Osmond, MD   400 mg at 08/20/21 [phone]  insulin aspart (novoLOG) injection 0-15 Units  0-15 Units Subcutaneous TID WC Athena Masse, MD   2 Units at 08/20/21 1660   loperamide (IMODIUM) capsule 2 mg  2 mg Oral BID PRN Richarda Osmond, MD   2 mg at 08/19/21 2029   menthol-cetylpyridinium (CEPACOL) lozenge 3 mg  1 lozenge Oral PRN Athena Masse, MD   3 mg at 08/19/21 1708   montelukast (SINGULAIR) tablet 10 mg  10 mg Oral Daily PRN Richarda Osmond, MD       multivitamin with minerals tablet 1 tablet  1 tablet Oral Daily Imagene Sheller S, DO   1 tablet at 08/20/21 0916   ondansetron (ZOFRAN) tablet 4 mg  4 mg Oral Q6H PRN Athena Masse, MD       Or   ondansetron [facility]) injection 4 mg  4 mg Intravenous Q6H PRN Athena Masse, MD       pantoprazole (PROTONIX) EC tablet 80 mg  80 mg Oral Daily Imagene Sheller S, DO   80 mg at 08/20/21 6301   promethazine (PHENERGAN) tablet 12.5 mg  12.5 mg Oral Q6H PRN Richarda Osmond, MD   12.5 mg at 08/20/21 1030   sodium chloride flush (NS) 0.9 % injection 10-40 mL  10-40 mL Intracatheter PRN Richarda Osmond, MD       tiZANidine (ZANAFLEX) tablet 2 mg  2 mg Oral Q6H PRN Richarda Osmond, MD   2 mg at 08/19/21 2030   traZODone (DESYREL) tablet 100 mg  100 mg Oral QHS Imagene Sheller S, DO       Facility-Administered Medications Ordered in Other Encounters  Medication Dose Route Frequency  Provider Last Rate Last Admin   sodium chloride flush (NS) 0.9 % injection 10 mL  10 mL Intravenous PRN Earlie Server, MD   10 mL at 04/02/21 6010     Abtx:  Anti-infectives (From admission, onward)    Start     Dose/Rate Route Frequency Ordered Stop   08/20/21 0000  cefTRIAXone (ROCEPHIN) 1 g in sodium chloride 0.9 % 100 mL IVPB  Status:  Discontinued        1 g 200 mL/hr over 30 Minutes Intravenous Every 24 hours 08/19/21 0113 08/19/21 0915   08/19/21 1400  acyclovir (ZOVIRAX) 200 MG capsule 400 mg        400 mg Oral 2 times daily 08/19/21 1310     08/19/21 1015  Ampicillin-Sulbactam (UNASYN) 3 g in sodium chloride 0.9 % 100 mL IVPB        3 g 200 mL/hr over 30 Minutes Intravenous Every 6 hours 08/19/21 0915     08/18/21 2330  cefTRIAXone (ROCEPHIN) 1 g in sodium chloride 0.9 % 100 mL IVPB        1 g 200 mL/hr over 30 Minutes Intravenous  Once 08/18/21 2317 08/19/21 0023       REVIEW OF SYSTEMS:  Const:  fever, negative chills,  weight loss Eyes: negative diplopia or visual changes, negative eye pain ENT: negative coryza,  sore throat Resp: cough, hemoptysis, dyspnea Cards:  chest pain, no palpitations, lower extremity edema GU: negative for frequency, dysuria and hematuria GI: Negative for abdominal pain, diarrhea, bleeding, constipation Skin: negative for rash and pruritus Heme: negative for easy bruising and gum/nose bleeding MS: Generalized weakness Neurolo:negative for headaches, dizziness, vertigo, memory problems  Psych: negative for feelings of anxiety, depression  Endocrine:, diabetes Allergy/Immunology- as above Objective:  VITALS:  BP 140/82 (BP Location:  Right Arm)    Pulse (!) 49    Temp 99.9 F (37.7 C) (Oral)    Resp 16    Ht _0  (1.575 m)    Wt 70.6 kg    SpO2 96%    BMI 28.47 kg/m  PHYSICAL EXAM:  General: Alert, cooperative, some resp distress, appears stated age.  Head: Normocephalic, without obvious abnormality, atraumatic. Eyes: Conjunctivae clear,  anicteric sclerae. Pupils are equal ENT Nares normal. No drainage or sinus tenderness. Lips, mucosa, and tongue normal. No Thrush Neck: Supple, symmetrical, no adenopathy, thyroid: non tender no carotid bruit and no JVD. Back: No CVA tenderness. Lungs: b/l rhonchi. Heart: Regular rate and rhythm, no murmur, rub or gallop. Abdomen: Soft, non-tender,not distended. Bowel sounds normal. No masses Extremities: atraumatic, no cyanosis. No edema. No clubbing Skin: No rashes or lesions. Or bruising Lymph: Cervical, supraclavicular normal. Neurologic: Grossly non-focal Pertinent Labs Lab Results CBC    Component Value Date/Time   WBC 6.1 08/20/2021 0452   RBC 3.62 (L) 08/20/2021 0452   HGB 9.7 (L) 08/20/2021 0452   HCT 30.0 (L) 08/20/2021 0452   PLT 68 (L) 08/20/2021 0452   MCV 82.9 08/20/2021 0452   MCH 26.8 08/20/2021 0452   MCHC 32.3 08/20/2021 0452   RDW 14.7 08/20/2021 0452   LYMPHSABS 1.0 08/18/2021 1731   MONOABS 0.5 08/18/2021 1731   EOSABS 0.1 08/18/2021 1731   BASOSABS 0.0 08/18/2021 1731    CMP Latest Ref Rng & Units 08/18/2021 08/01/2021 07/19/2021  Glucose 70 - 99 mg/dL 146(H) 161(H) 171(H)  BUN 8 - 23 mg/dL _1 Creatinine 0.44 - 1.00 mg/dL 1.12(H) 1.09(H) 0.83  Sodium 135 - 145 mmol/L 130(L) 135 137  Potassium 3.5 - 5.1 mmol/L 3.9 3.9 4.1  Chloride 98 - 111 mmol/L 99 105 105  CO2 22 - 32 mmol/L 20(L) 22 25  Calcium 8.9 - 10.3 mg/dL 8.8(L) 9.1 8.7(L)  Total Protein 6.5 - 8.1 g/dL 6.7 7.0 7.0  Total Bilirubin 0.3 - 1.2 mg/dL 0.5 0.5 0.6  Alkaline Phos 38 - 126 U/L 92 90 94  AST 15 - 41 U/L 37 53(H) 52(H)  ALT 0 - 44 U/L 57(H) 62(H) 62(H)     Procal < 0.10 Microbiology: Recent Results (from the past 240 hour(s))  Culture, blood (Routine x 2)     Status: None (Preliminary result)   Collection Time: 08/18/21  5:31 PM   Specimen: BLOOD  Result Value Ref Range Status   Specimen Description BLOOD BLOOD RIGHT FOREARM  Final   Special Requests   Final     BOTTLES DRAWN AEROBIC AND ANAEROBIC Blood Culture adequate volume   Culture   Final    NO GROWTH 2 DAYS Performed at [facility], [address] [place], [place] [address]    Report Status PENDING  Incomplete  Culture, blood (Routine x 2)     Status: None (Preliminary result)   Collection Time: 08/18/21  6:41 PM   Specimen: BLOOD  Result Value Ref Range Status   Specimen Description BLOOD RIGHT ANTECUBITAL  Final   Special Requests   Final    BOTTLES DRAWN AEROBIC AND ANAEROBIC Blood Culture adequate volume   Culture   Final    NO GROWTH 2 DAYS Performed at [facility], [address] [place], [place] [address]    Report Status PENDING  Incomplete  Resp Panel by RT-PCR (Flu A&B, Covid) Nasopharyngeal Swab     Status: None   Collection  Time: 08/18/21  7:36 PM   Specimen: Nasopharyngeal Swab; Nasopharyngeal(NP) swabs in vial transport medium  Result Value Ref Range Status   SARS Coronavirus 2 by RT PCR NEGATIVE NEGATIVE Final    Comment: (NOTE) SARS-CoV-2 target nucleic acids are NOT DETECTED.  The SARS-CoV-2 RNA is generally detectable in upper respiratory specimens during the acute phase of infection. The lowest concentration of SARS-CoV-2 viral copies this assay can detect is 138 copies/mL. A negative result does not preclude SARS-Cov-2 infection and should not be used as the sole basis for treatment or other patient management decisions. A negative result may occur with  improper specimen collection/handling, submission of specimen other than nasopharyngeal swab, presence of viral mutation(s) within the areas targeted by this assay, and inadequate number of viral copies(<138 copies/mL). A negative result must be combined with clinical observations, patient history, and epidemiological information. The expected result is Negative.  Fact Sheet for Patients:  EntrepreneurPulse.com.au  Fact Sheet for Healthcare Providers:   IncredibleEmployment.be  This test is no t yet approved or cleared by the [place] FDA and  has been authorized for detection and/or diagnosis of SARS-CoV-2 by FDA under an Emergency Use Authorization (EUA). This EUA will remain  in effect (meaning this test can be used) for the duration of the COVID-19 declaration under Section 564(b)(1) of the Act, 21 U.S.C.section 360bbb-3(b)(1), unless the authorization is terminated  or revoked sooner.       Influenza A by PCR NEGATIVE NEGATIVE Final   Influenza B by PCR NEGATIVE NEGATIVE Final    Comment: (NOTE) The Xpert Xpress SARS-CoV-2/FLU/RSV plus assay is intended as an aid in the diagnosis of influenza from Nasopharyngeal swab specimens and should not be used as a sole basis for treatment. Nasal washings and aspirates are unacceptable for Xpert Xpress SARS-CoV-2/FLU/RSV testing.  Fact Sheet for Patients: EntrepreneurPulse.com.au  Fact Sheet for Healthcare Providers: IncredibleEmployment.be  This test is not yet approved or cleared by the [place] FDA and has been authorized for detection and/or diagnosis of SARS-CoV-2 by FDA under an Emergency Use Authorization (EUA). This EUA will remain in effect (meaning this test can be used) for the duration of the COVID-19 declaration under Section 564(b)(1) of the Act, 21 U.S.C. section 360bbb-3(b)(1), unless the authorization is terminated or revoked.  Performed at [facility], [address] [place], [place] [address]   Urine Culture     Status: Abnormal   Collection Time: 08/18/21 10:30 PM   Specimen: Urine, Clean Catch  Result Value Ref Range Status   Specimen Description   Final    URINE, CLEAN CATCH Performed at [facility], [address], [place], [place] [address]    Special Requests   Final    NONE Performed at [facility], [address] [place], [place] [address]     Culture MULTIPLE SPECIES PRESENT, SUGGEST RECOLLECTION (A)  Final   Report Status 08/20/2021 FINAL  Final  Expectorated Sputum Assessment w Gram Stain, Rflx to Resp Cult     Status: None   Collection Time: 08/19/21  8:03 AM   Specimen: Sputum  Result Value Ref Range Status   Specimen Description SPUTUM  Final   Special Requests NONE  Final   Sputum evaluation   Final    THIS SPECIMEN IS ACCEPTABLE FOR SPUTUM CULTURE Performed at [facility], [address] [place], [place] [address]    Report Status 08/19/2021 FINAL  Final  Culture, Respiratory w Gram Stain  Status: None (Preliminary result)   Collection Time: 08/19/21  8:03 AM   Specimen: SPU  Result Value Ref Range Status   Specimen Description   Final    SPUTUM Performed at [facility], [address] [place], [place] [address]    Special Requests   Final    NONE Reflexed from [id number] Performed at [facility], [place], [place] [address]    Gram Stain   Final    RARE SQUAMOUS EPITHELIAL CELLS PRESENT MODERATE WBC PRESENT,BOTH PMN AND MONONUCLEAR RARE GRAM POSITIVE COCCI RARE GRAM POSITIVE RODS    Culture   Final    CULTURE REINCUBATED FOR BETTER GROWTH Performed at [facility], [place] [address] [place],  [address]    Report Status PENDING  Incomplete    IMAGING RESULTS:  I have personally reviewed the films ? Impression/Recommendation ? Multiple myeloma s/p 2 autologous HCT- last on 05/2020 On monthly daratumumab ? Respiratory tract infection with bronchitis - likely viral as she has been exposed to a 71-year-old who family member was had respiratory symptoms. 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

## 2021-08-20 NOTE — Progress Notes (Signed)
Progress Note:    Sarah [name]    AFB:[id number] DOB: [birth year] DOA: 08/18/2021  PCP: Glean Hess, MD    Brief Narrative:   Sarah [name] is a 66 y.o. 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 faecalis UTI, after presenting with fever, and again in December 2022 with URI ruled out for sepsis.  They presented from home to the ED on 08/18/2021 with fever of 103, cough, sore throat, hoarseness x 2 days.  In the ED, it was found that they had sepsis with fever of 102.9, tachypnea, tachycardia and unknown source of infection but hx/px suspicious for pulmonary source.  They were treated with IV Abx and supportive care.  Patient was admitted to medicine service for further workup and management of sepsis as outlined in detail below.     Subjective:   The patient had a 102.5 fever last night.  Still having a cough.  States he was initially dry and now productive.  Possibly a viral URI.  She is not encephalopathic.  Complaining of some left lower jaw pain.  Had 9 teeth pulled in November/December.   Assessment and Plan:   Sepsis: Likely secondary to viral URI.  COVID-19/influenza/RSV PCR negative.  Full RVP panel pending.  Infectious disease consulted.  Sputum culture pending results.  Chest x-ray showed no infiltrates or consolidation.  CTA showed signs of bronchitis.  She is on room air and saturating well.  Continue Unasyn for pulmonary coverage and oral flora.  Blood cultures are negative to date.  Urine culture was nondiagnostic.   Multiple myeloma: Status post autologous stem cell transplant 05/2020.  On maintenance chemotherapy. Oncology consulted.  They plan  on starting IVIG due to CVID from MM.  Oncology will start Privigen.  They will use only brand-name due to being more renal friendly.  Had issues with AKI in the past with IVIG.  Oncology recommends continuing mIVF.  Type 2 diabetes mellitus with complication: Most recent hemoglobin A1c 7.5.  Continue SSI  Severe aortic stenosis, nonrheumatic: Follows with cardiology [facility].  No acute issues  Spinal stenosis of lumbar region with neurogenic claudication: Continue tramadol  Thrombocytopenia: Worsened likely due to acute infection.  Platelets down to 68 today. Discontinue Lovenox subcu for DVT prophylaxis.  Chemotherapy-induced neuropathy: Continue Lyrica  Chemotherapy-induced diarrhea: Continue loperamide   Other information:    DVT prophylaxis: SCDs Code Status: Full code Family Communication: None Disposition:   Status is: Inpatient Remains inpatient appropriate because: Fevers requiring IVIG infusion     Consultants:   Infectious disease, oncology    Objective:    Vitals:   08/20/21 0557 08/20/21 0755 08/20/21 0913 08/20/21 1206  BP: (!) 150/82 140/82  (!) 142/89  Pulse: 97 (!) 49  95  Resp: 18 16  17   Temp: 98.9 F (37.2 C) 99.9 F (37.7 C)  99.5 F (37.5 C)  TempSrc: Oral Oral  Oral  SpO2: 94% 96% 96% 98%  Weight:      Height:        Intake/Output Summary (Last 24 hours) at 08/20/2021 1304 Last data filed at 08/20/2021 1100 Gross per 24 hour  Intake 3413.69 ml  Output 450 ml  Net 2963.69 ml   [employer]  08/18/21 1723 08/19/21 0103  Weight: 72.6 kg 70.6 kg       Physical Exam:    General exam: Appears calm and comfortable, right Port-A-Cath in place Respiratory system: Scattered rhonchorous breath sounds and intermittent productive cough. Respiratory effort normal. Cardiovascular system: S1 & S2 heard, RRR. No JVD, murmurs, rubs, gallops or clicks. No pedal edema. Gastrointestinal system: Abdomen is nondistended, soft and  nontender. No organomegaly or masses felt. Normal bowel sounds heard. Central nervous system: Alert and oriented. No focal neurological deficits. Extremities: Symmetric 5 x 5 power. Skin: No rashes, lesions or ulcers Psychiatry: Judgement and insight appear normal. Mood & affect appropriate.     Data Reviewed:    I have personally reviewed following labs and imaging studies  CBC: Recent Labs  Lab 08/18/21 1731 08/20/21 0452  WBC 7.7 6.1  NEUTROABS 6.0  --   HGB 12.1 9.7*  HCT 35.9* 30.0*  MCV 80.9 82.9  PLT 93* 68*    Basic Metabolic Panel: Recent Labs  Lab 08/15/21 1009 08/18/21 1731  NA  --  130*  K  --  3.9  CL  --  99  CO2  --  20*  GLUCOSE  --  146*  BUN  --  23  CREATININE  --  1.12*  CALCIUM  --  8.8*  MG 1.2*  --     GFR: Estimated Creatinine Clearance: 46.7 mL/min (A) (by C-G formula based on SCr of 1.12 mg/dL (H)).  Liver Function Tests: Recent Labs  Lab 08/18/21 1731  AST 37  ALT 57*  ALKPHOS 92  BILITOT 0.5  PROT 6.7  ALBUMIN 4.2    CBG: Recent Labs  Lab 08/19/21 1147 08/19/21 1552 08/19/21 2102 08/20/21 0756 08/20/21 1150  GLUCAP 133* 157* 152* 126* 119*     Recent Results (from the past 240 hour(s))  Culture, blood (Routine x 2)     Status: None (Preliminary result)   Collection Time: 08/18/21  5:31 PM   Specimen: BLOOD  Result Value Ref Range Status   Specimen Description BLOOD BLOOD RIGHT FOREARM  Final   Special Requests   Final    BOTTLES DRAWN AEROBIC AND ANAEROBIC Blood Culture adequate volume   Culture   Final    NO GROWTH 2 DAYS Performed at [facility], [address] [place], [place] [address]    Report Status PENDING  Incomplete  Culture, blood (Routine x 2)     Status: None (Preliminary result)   Collection Time: 08/18/21  6:41 PM   Specimen: BLOOD  Result Value Ref Range Status   Specimen Description BLOOD RIGHT ANTECUBITAL  Final   Special Requests   Final    BOTTLES DRAWN AEROBIC AND  ANAEROBIC Blood Culture adequate volume   Culture   Final    NO GROWTH 2 DAYS Performed at [facility], [address] [place], [place] [address]    Report Status PENDING  Incomplete  Resp Panel by RT-PCR (Flu A&B, Covid) Nasopharyngeal Swab     Status: None   Collection Time: 08/18/21  7:36 PM   Specimen: Nasopharyngeal Swab; Nasopharyngeal(NP) swabs in vial transport medium  Result Value Ref Range Status   SARS Coronavirus 2 by RT PCR NEGATIVE NEGATIVE Final    Comment: (NOTE) SARS-CoV-2 target nucleic acids are NOT DETECTED.  The SARS-CoV-2 RNA is generally detectable in upper respiratory specimens during the acute phase of infection. The lowest concentration of SARS-CoV-2 viral copies this assay can detect is 138 copies/mL.  A negative result does not preclude SARS-Cov-2 infection and should not be used as the sole basis for treatment or other patient management decisions. A negative result may occur with  improper specimen collection/handling, submission of specimen other than nasopharyngeal swab, presence of viral mutation(s) within the areas targeted by this assay, and inadequate number of viral copies(<138 copies/mL). A negative result must be combined with clinical observations, patient history, and epidemiological information. The expected result is Negative.  Fact Sheet for Patients:  EntrepreneurPulse.com.au  Fact Sheet for Healthcare Providers:  IncredibleEmployment.be  This test is no t yet approved or cleared by the [place] FDA and  has been authorized for detection and/or diagnosis of SARS-CoV-2 by FDA under an Emergency Use Authorization (EUA). This EUA will remain  in effect (meaning this test can be used) for the duration of the COVID-19 declaration under Section 564(b)(1) of the Act, 21 U.S.C.section 360bbb-3(b)(1), unless the authorization is terminated  or revoked sooner.       Influenza A by PCR  NEGATIVE NEGATIVE Final   Influenza B by PCR NEGATIVE NEGATIVE Final    Comment: (NOTE) The Xpert Xpress SARS-CoV-2/FLU/RSV plus assay is intended as an aid in the diagnosis of influenza from Nasopharyngeal swab specimens and should not be used as a sole basis for treatment. Nasal washings and aspirates are unacceptable for Xpert Xpress SARS-CoV-2/FLU/RSV testing.  Fact Sheet for Patients: EntrepreneurPulse.com.au  Fact Sheet for Healthcare Providers: IncredibleEmployment.be  This test is not yet approved or cleared by the [place] FDA and has been authorized for detection and/or diagnosis of SARS-CoV-2 by FDA under an Emergency Use Authorization (EUA). This EUA will remain in effect (meaning this test can be used) for the duration of the COVID-19 declaration under Section 564(b)(1) of the Act, 21 U.S.C. section 360bbb-3(b)(1), unless the authorization is terminated or revoked.  Performed at [facility], [address], [place], [place] [address]   Urine Culture     Status: Abnormal   Collection Time: 08/18/21 10:30 PM   Specimen: Urine, Clean Catch  Result Value Ref Range Status   Specimen Description   Final    URINE, CLEAN CATCH Performed at [facility], [address], [place], [place] [address]    Special Requests   Final    NONE Performed at [facility], [address] [place], [place] [address]    Culture MULTIPLE SPECIES PRESENT, SUGGEST RECOLLECTION (A)  Final   Report Status 08/20/2021 FINAL  Final  Expectorated Sputum Assessment w Gram Stain, Rflx to Resp Cult     Status: None   Collection Time: 08/19/21  8:03 AM   Specimen: Sputum  Result Value Ref Range Status   Specimen Description SPUTUM  Final   Special Requests NONE  Final   Sputum evaluation   Final    THIS SPECIMEN IS ACCEPTABLE FOR SPUTUM CULTURE Performed at [facility], [address] [place], [place] [address]     Report Status 08/19/2021 FINAL  Final  Culture, Respiratory w Gram Stain     Status: None (Preliminary result)   Collection Time: 08/19/21  8:03 AM   Specimen: SPU  Result Value Ref Range Status   Specimen Description   Final    SPUTUM Performed at [facility], [address] [place], [place] [address]    Special Requests   Final    NONE Reflexed from [id number] Performed at [facility], [address], [place], [place] [address]  Gram Stain   Final    RARE SQUAMOUS EPITHELIAL CELLS PRESENT MODERATE WBC PRESENT,BOTH PMN AND MONONUCLEAR RARE GRAM POSITIVE COCCI RARE GRAM POSITIVE RODS    Culture   Final    CULTURE REINCUBATED FOR BETTER GROWTH Performed at [facility], [place] [address] [place],  [address]    Report Status PENDING  Incomplete         Radiology Studies:    DG Chest 1 View  Result Date: 08/19/2021 CLINICAL DATA:  Dyspnea. EXAM: CHEST  1 VIEW COMPARISON:  August 18, 2021 FINDINGS: There is stable right-sided venous Port-A-Cath positioning. The heart size and mediastinal contours are within normal limits. Both lungs are clear. Multilevel degenerative changes are seen throughout the thoracic spine. IMPRESSION: No active disease. Electronically Signed   By: Virgina Norfolk M.D.   On: 08/19/2021 21:19   DG Chest 2 View  Result Date: 08/18/2021 CLINICAL DATA:  Shortness of breath, cough, fever, suspected sepsis, multiple myeloma EXAM: CHEST - 2 VIEW COMPARISON:  08/17/2021 FINDINGS: The heart size and mediastinal contours are within normal limits. Both lungs are clear. Disc degenerative disease of the thoracic spine. IMPRESSION: No acute abnormality of the lungs. Electronically Signed   By: Delanna Ahmadi M.D.   On: 08/18/2021 18:13   CT Angio Chest PE W and/or Wo Contrast  Result Date: 08/18/2021 CLINICAL DATA:  Pulmonary embolism suspected, high probability. Shortness of breath and fever beginning 3 days ago. EXAM: CT  ANGIOGRAPHY CHEST WITH CONTRAST TECHNIQUE: Multidetector CT imaging of the chest was performed using the standard protocol during bolus administration of intravenous contrast. Multiplanar CT image reconstructions and MIPs were obtained to evaluate the vascular anatomy. RADIATION DOSE REDUCTION: This exam was performed according to the departmental dose-optimization program which includes automated exposure control, adjustment of the mA and/or kV according to patient size and/or use of iterative reconstruction technique. CONTRAST:  30m OMNIPAQUE IOHEXOL 350 MG/ML SOLN COMPARISON:  Chest radiography same day. Prior CT angiography 03/17/2020 FINDINGS: Cardiovascular: The heart is enlarged. No pericardial fluid. No visible coronary artery calcification or aortic atherosclerotic calcification. Pulmonary arterial opacification is moderate. No pulmonary emboli are seen. Mediastinum/Nodes: Chronic calcified mediastinal lymph nodes as noted previously. Lungs/Pleura: No pulmonary infiltrate, collapse or effusion. Patient does have some scattered areas of bronchial thickening. No pleural effusion. Upper Abdomen: Fatty change of the liver. No focal lesion is visible. Previous cholecystectomy. Musculoskeletal: Negative Review of the MIP images confirms the above findings. IMPRESSION: No pulmonary emboli. Mild cardiomegaly as seen previously. Possible bronchitis pattern. No pulmonary consolidation or collapse. No pleural effusion. Chronic calcified mediastinal lymph nodes. Electronically Signed   By: MNelson ChimesM.D.   On: 08/18/2021 20:08        Medications:    Scheduled Meds:  acyclovir  400 mg Oral BID   Chlorhexidine Gluconate Cloth  6 each Topical Daily   enoxaparin (LOVENOX) injection  40 mg Subcutaneous Q24H   FLUoxetine  40 mg Oral Daily   insulin aspart  0-15 Units Subcutaneous TID WC   multivitamin with minerals  1 tablet Oral Daily   pantoprazole  80 mg Oral Daily   traZODone  100 mg Oral QHS    Continuous Infusions:  sodium chloride 125 mL/hr at 08/20/21 0353   ampicillin-sulbactam (UNASYN) IV 3 g (08/20/21 0931)   Immune Globulin 10%         LOS: 2 days    Time spent: 40 minutes    SLeslee Home MD [facility]  To contact the attending provider between 7A-7P or the covering provider during after hours 7P-7A, please log into the web site [email or web address] and access using universal [place] password for that web site. If you do not have the password, please call the hospital operator.  08/20/2021, 1:04 PM

## 2021-08-20 NOTE — Progress Notes (Signed)
Dr. Rowe Pavy notified of elevated troponin.

## 2021-08-21 ENCOUNTER — Inpatient Hospital Stay
Admit: 2021-08-21 | Discharge: 2021-08-21 | Disposition: A | Discharge status: Home / Self Care | Payer: Medicare HMO | Attending: Internal Medicine | Admitting: Internal Medicine

## 2021-08-21 ENCOUNTER — Inpatient Hospital Stay: Payer: Medicare HMO

## 2021-08-21 ENCOUNTER — Telehealth: Payer: Self-pay | Admitting: Oncology

## 2021-08-21 DIAGNOSIS — J40 Bronchitis, not specified as acute or chronic: Secondary | ICD-10-CM | POA: Diagnosis not present

## 2021-08-21 DIAGNOSIS — J208 Acute bronchitis due to other specified organisms: Secondary | ICD-10-CM | POA: Diagnosis not present

## 2021-08-21 DIAGNOSIS — A419 Sepsis, unspecified organism: Secondary | ICD-10-CM | POA: Diagnosis not present

## 2021-08-21 DIAGNOSIS — B9781 Human metapneumovirus as the cause of diseases classified elsewhere: Secondary | ICD-10-CM

## 2021-08-21 DIAGNOSIS — I214 Non-ST elevation (NSTEMI) myocardial infarction: Secondary | ICD-10-CM

## 2021-08-21 DIAGNOSIS — R768 Other specified abnormal immunological findings in serum: Secondary | ICD-10-CM | POA: Diagnosis not present

## 2021-08-21 DIAGNOSIS — Z9484 Stem cells transplant status: Secondary | ICD-10-CM | POA: Diagnosis not present

## 2021-08-21 HISTORY — DX: Non-ST elevation (NSTEMI) myocardial infarction: I21.4

## 2021-08-21 LAB — TROPONIN I (HIGH SENSITIVITY)
Troponin I (High Sensitivity): 1569 ng/L (ref <18)
Troponin I (High Sensitivity): 2239 ng/L (ref <18)
Troponin I (High Sensitivity): 2111 ng/L (ref <18)
Troponin I (High Sensitivity): 2143 ng/L (ref <18)

## 2021-08-21 LAB — BASIC METABOLIC PANEL
Anion gap: 14 (ref 5–15)
BUN: 18 mg/dL (ref 8–23)
CO2: 19 mmol/L — ABNORMAL LOW (ref 22–32)
Calcium: 8 mg/dL — ABNORMAL LOW (ref 8.9–10.3)
Chloride: 103 mmol/L (ref 98–111)
Creatinine, Ser: 1.08 mg/dL — ABNORMAL HIGH (ref 0.44–1.00)
GFR, Estimated: 57 mL/min — ABNORMAL LOW (ref >60)
Glucose, Bld: 255 mg/dL — ABNORMAL HIGH (ref 70–99)
Potassium: 3.7 mmol/L (ref 3.5–5.1)
Sodium: 136 mmol/L (ref 135–145)

## 2021-08-21 LAB — CBC
HCT: 31.8 % — ABNORMAL LOW (ref 36.0–46.0)
Hemoglobin: 10.4 g/dL — ABNORMAL LOW (ref 12.0–15.0)
MCH: 26.9 pg (ref 26.0–34.0)
MCHC: 32.7 g/dL (ref 30.0–36.0)
MCV: 82.4 fL (ref 80.0–100.0)
Platelets: 89 10*3/uL — ABNORMAL LOW (ref 150–400)
RBC: 3.86 MIL/uL — ABNORMAL LOW (ref 3.87–5.11)
RDW: 14.8 % (ref 11.5–15.5)
WBC: 6.5 10*3/uL (ref 4.0–10.5)
nRBC: 0 % (ref 0.0–0.2)

## 2021-08-21 LAB — ECHOCARDIOGRAM COMPLETE
AR max vel: 0.63 cm2
AV Area VTI: 0.56 cm2
AV Area mean vel: 0.51 cm2
AV Mean grad: 14.7 mmHg
AV Peak grad: 24.5 mmHg
Ao pk vel: 2.48 m/s
Area-P 1/2: 4.33 cm2
Calc EF: 38.6 %
Height: 62 in
MV VTI: 0.87 cm2
S' Lateral: 3.3 cm
Single Plane A2C EF: 30.4 %
Single Plane A4C EF: 44.8 %
Weight: 2490.32 oz

## 2021-08-21 LAB — RESPIRATORY PANEL BY PCR

## 2021-08-21 LAB — CULTURE, RESPIRATORY W GRAM STAIN: Culture: NORMAL

## 2021-08-21 LAB — APTT: aPTT: 51 seconds — ABNORMAL HIGH (ref 24–36)

## 2021-08-21 LAB — LIPID PANEL
Cholesterol: 118 mg/dL (ref 0–200)
HDL: 48 mg/dL (ref >40)
LDL Cholesterol: 58 mg/dL (ref 0–99)
Total CHOL/HDL Ratio: 2.5 RATIO
Triglycerides: 61 mg/dL (ref <150)
VLDL: 12 mg/dL (ref 0–40)

## 2021-08-21 LAB — BRAIN NATRIURETIC PEPTIDE: B Natriuretic Peptide: 1561.4 pg/mL — ABNORMAL HIGH (ref 0.0–100.0)

## 2021-08-21 LAB — TSH: TSH: 0.555 u[IU]/mL (ref 0.350–4.500)

## 2021-08-21 LAB — HEPARIN LEVEL (UNFRACTIONATED)
Heparin Unfractionated: 0.4 IU/mL (ref 0.30–0.70)
Heparin Unfractionated: 0.34 IU/mL (ref 0.30–0.70)

## 2021-08-21 LAB — GLUCOSE, CAPILLARY
Glucose-Capillary: 236 mg/dL — ABNORMAL HIGH (ref 70–99)
Glucose-Capillary: 193 mg/dL — ABNORMAL HIGH (ref 70–99)
Glucose-Capillary: 129 mg/dL — ABNORMAL HIGH (ref 70–99)
Glucose-Capillary: 108 mg/dL — ABNORMAL HIGH (ref 70–99)

## 2021-08-21 LAB — PROTIME-INR
INR: 1.1 (ref 0.8–1.2)
Prothrombin Time: 14.5 seconds (ref 11.4–15.2)

## 2021-08-21 MED ORDER — INSULIN ASPART 100 UNIT/ML IJ SOLN
3.0000 [IU] | Freq: Three times a day (TID) | INTRAMUSCULAR | Status: DC
  Administered 2021-08-22 – 2021-08-25 (×10): 3 [IU] via SUBCUTANEOUS
  Filled 2021-08-21 (×10): qty 1

## 2021-08-21 MED ORDER — MAGNESIUM CHLORIDE 64 MG PO TBEC
1.0000 | DELAYED_RELEASE_TABLET | Freq: Two times a day (BID) | ORAL | Status: DC
  Administered 2021-08-21 – 2021-08-25 (×9): 64 mg via ORAL
  Filled 2021-08-21 (×11): qty 1

## 2021-08-21 MED ORDER — FUROSEMIDE 10 MG/ML IJ SOLN
40.0000 mg | Freq: Once | INTRAMUSCULAR | Status: AC
  Administered 2021-08-21: 40 mg via INTRAVENOUS
  Filled 2021-08-21: qty 4

## 2021-08-21 MED ORDER — IBUPROFEN 400 MG PO TABS
400.0000 mg | ORAL_TABLET | Freq: Once | ORAL | Status: AC
  Administered 2021-08-21: 400 mg via ORAL
  Filled 2021-08-21: qty 1

## 2021-08-21 MED ORDER — HEPARIN (PORCINE) 25000 UT/250ML-% IV SOLN
800.0000 [IU]/h | INTRAVENOUS | Status: DC
  Administered 2021-08-21 – 2021-08-22 (×2): 800 [IU]/h via INTRAVENOUS
  Filled 2021-08-21 (×2): qty 250

## 2021-08-21 MED ORDER — ORAL CARE MOUTH RINSE
15.0000 mL | Freq: Two times a day (BID) | OROMUCOSAL | Status: DC
  Administered 2021-08-21 – 2021-08-25 (×9): 15 mL via OROMUCOSAL

## 2021-08-21 MED ORDER — INSULIN GLARGINE-YFGN 100 UNIT/ML ~~LOC~~ SOLN
5.0000 [IU] | Freq: Two times a day (BID) | SUBCUTANEOUS | Status: DC
  Administered 2021-08-21 – 2021-08-25 (×7): 5 [IU] via SUBCUTANEOUS
  Filled 2021-08-21 (×13): qty 0.05

## 2021-08-21 MED ORDER — NITROGLYCERIN 0.4 MG SL SUBL
0.4000 mg | SUBLINGUAL_TABLET | SUBLINGUAL | Status: DC | PRN
  Administered 2021-08-23 (×2): 0.4 mg via SUBLINGUAL

## 2021-08-21 MED ORDER — HEPARIN BOLUS VIA INFUSION
2000.0000 [IU] | Freq: Once | INTRAVENOUS | Status: AC
  Administered 2021-08-21: 01:00:00 2000 [IU] via INTRAVENOUS
  Filled 2021-08-21: qty 2000

## 2021-08-21 MED ORDER — TRAMADOL HCL 50 MG PO TABS
50.0000 mg | ORAL_TABLET | Freq: Two times a day (BID) | ORAL | Status: DC | PRN
  Administered 2021-08-21 – 2021-08-24 (×4): 50 mg via ORAL
  Filled 2021-08-21 (×4): qty 1

## 2021-08-21 MED ORDER — LORAZEPAM 2 MG/ML IJ SOLN
1.0000 mg | Freq: Once | INTRAMUSCULAR | Status: AC
  Administered 2021-08-21: 1 mg via INTRAVENOUS
  Filled 2021-08-21: qty 1

## 2021-08-21 NOTE — Progress Notes (Signed)
Date of Admission:  08/18/2021   ID: Sarah [name] is a 66 y.o. female Principal Problem:   UTI (urinary tract infection) Active Problems:   Multiple myeloma not having achieved remission ([place])   Type II diabetes mellitus with complication ([place])   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[place])   Long term prescription opiate use   Severe aortic stenosis   Thrombocytopenia (HCC)   Sepsis (HCC)   Bronchitis   Fever   Low immunoglobulin level    Subjective: Patient has some chest tightness, is coughing blood-tinged sputum no mucus  Overnight she had developed chest pain with shortness of breath with elevated troponins which had peaked at 2239.  EKG with new left bundle branch block and echocardiogram with new onset reduced EF of 30 to 35% and regional wall motion abnormalities.  This was consistent with NSTEMI.  Cardiology was consulted and she was placed on heparin infusion pending further ischemic evaluation.   medications:   acyclovir  400 mg Oral BID   Chlorhexidine Gluconate Cloth  6 each Topical Daily   dextromethorphan-guaiFENesin  1 tablet Oral BID   FLUoxetine  40 mg Oral Daily   insulin aspart  0-15 Units Subcutaneous TID WC   ipratropium-albuterol  3 mL Nebulization Q6H   magnesium chloride  1 tablet Oral BID   mouth rinse  15 mL Mouth Rinse BID   metoprolol tartrate  12.5 mg Oral BID   multivitamin with minerals  1 tablet Oral Daily   pantoprazole  80 mg Oral Daily   traZODone  100 mg Oral QHS    Objective: Vital signs in last 24 hours: Temp:  [98 F (36.7 C)-99.7 F (37.6 C)] 98.6 F (37 C) (02/21 1500) Pulse Rate:  [81-97] 90 (02/21 1500) Resp:  [17-20] 20 (02/21 1500) BP: (122-150)/(73-95) 125/90 (02/21 1500) SpO2:  [92 %-100 %] 100 % (02/21 1500)  PHYSICAL EXAM:  General: Alert, cooperative, no distress, appears stated age.  Lungs: b/l air entry- rhonchi Basal crepts Heart: Regular rate and rhythm, no murmur, rub or gallop.systolic  murmur Abdomen: Soft, non-tender,not distended. Bowel sounds normal. No masses Extremities: atraumatic, no cyanosis. No edema. No clubbing Skin: No rashes or lesions. Or bruising Lymph: Cervical, supraclavicular normal. Neurologic: Grossly non-focal  Lab Results Recent Labs    08/18/21 1731 08/20/21 0452 08/21/21 0505  WBC 7.7 6.1 6.5  HGB 12.1 9.7* 10.4*  HCT 35.9* 30.0* 31.8*  NA 130*  --  136  K 3.9  --  3.7  CL 99  --  103  CO2 20*  --  19*  BUN 23  --  18  CREATININE 1.12*  --  1.08*   Liver Panel Recent Labs    08/18/21 1731  PROT 6.7  ALBUMIN 4.2  AST 37  ALT 57*  ALKPHOS 92  BILITOT 0.5  Microbiology: Respiratory viral PCR panel positive for human metapneumovirus   studies/Results: DG Chest 1 View  Result Date: 08/19/2021 CLINICAL DATA:  Dyspnea. EXAM: CHEST  1 VIEW COMPARISON:  August 18, 2021 FINDINGS: There is stable right-sided venous Port-A-Cath positioning. The heart size and mediastinal contours are within normal limits. Both lungs are clear. Multilevel degenerative changes are seen throughout the thoracic spine. IMPRESSION: No active disease. Electronically Signed   By: Virgina Norfolk M.D.   On: 08/19/2021 21:19   DG Chest Port 1 View  Result Date: 08/20/2021 CLINICAL DATA:  Shortness of breath, cough EXAM: PORTABLE CHEST 1 VIEW COMPARISON:  Previous studies including the  examination of 08/19/2021 FINDINGS: Transverse diameter of heart is within normal limits. Tip of right IJ chest port is seen in the superior vena cava close to the right atrium. Central pulmonary vessels are more prominent. There is interval increase in interstitial markings in both parahilar regions and both lower lung fields. There is no focal consolidation. There is minimal blunting of lateral CP angles. There is no pneumothorax. IMPRESSION: Central pulmonary vessels are more prominent suggesting CHF. Increased interstitial markings are seen in both lungs suggesting interstitial  pulmonary edema. Less likely possibility would be interstitial pneumonia. Minimal bilateral pleural effusions. Electronically Signed   By: Elmer Picker M.D.   On: 08/20/2021 17:59   ECHOCARDIOGRAM COMPLETE  Result Date: 08/21/2021    ECHOCARDIOGRAM REPORT   Patient Name:   Sarah [name] Date of Exam: 08/21/2021 Medical Rec #:  [id number]        Height:       62.0 in Accession #:    [id number]       Weight:       155.6 lb Date of Birth:  04-06-1956         BSA:          1.718 m Patient Age:    43 years         BP:           138/85 mmHg Patient Gender: F                HR:           91 bpm. Exam Location:  [facility] Procedure: 2D Echo, Cardiac Doppler and Color Doppler Indications:     Chest pain R07.9  History:         Patient has prior history of Echocardiogram examinations, most                  recent 11/16/2019. Cardiomyopathy; Risk Factors:Diabetes and                  Hypertension. Bicuspid AOV.  Sonographer:     Sherrie Sport Referring Phys:  [id number] Blennerhassett L FOUST Diagnosing Phys: Yolonda Kida MD  Sonographer Comments: Suboptimal apical window. IMPRESSIONS  1. Ant/Apical/Septal/High Lateral Hypo.  2. Bicusp Severe AS.  3. Left ventricular ejection fraction, by estimation, is 35 to 40%. The left ventricle has moderately decreased function. The left ventricle demonstrates regional wall motion abnormalities (see scoring diagram/findings for description). Left ventricular  diastolic parameters were normal.  4. Right ventricular systolic function is normal. The right ventricular size is normal.  5. Left atrial size was mildly dilated.  6. The mitral valve is normal in structure. Mild mitral valve regurgitation.  7. The aortic valve is bicuspid. There is moderate calcification of the aortic valve. There is moderate thickening of the aortic valve. Aortic valve regurgitation is trivial. Severe aortic valve stenosis. FINDINGS  Left Ventricle: Left ventricular ejection fraction, by estimation, is 35 to 40%. The  left ventricle has moderately decreased function. The left ventricle demonstrates regional wall motion abnormalities. The left ventricular internal cavity size was normal in size. There is no left ventricular hypertrophy. Left ventricular diastolic parameters were normal. Right Ventricle: The right ventricular size is normal. No increase in right ventricular wall thickness. Right ventricular systolic function is normal. Left Atrium: Left atrial size was mildly dilated. Right Atrium: Right atrial size was normal in size. Pericardium: There is no evidence of pericardial effusion. Mitral Valve: The mitral valve is normal in structure. Mild  mitral valve regurgitation. MV peak gradient, 4.7 mmHg. The mean mitral valve gradient is 2.0 mmHg. Tricuspid Valve: The tricuspid valve is normal in structure. Tricuspid valve regurgitation is mild. Aortic Valve: The aortic valve is bicuspid. There is moderate calcification of the aortic valve. There is moderate thickening of the aortic valve. There is moderate aortic valve annular calcification. Aortic valve regurgitation is trivial. Severe aortic stenosis is present. Aortic valve mean gradient measures 14.7 mmHg. Aortic valve peak gradient measures 24.5 mmHg. Aortic valve area, by VTI measures 0.56 cm. Pulmonic Valve: The pulmonic valve was normal in structure. Pulmonic valve regurgitation is trivial. Aorta: The ascending aorta was not well visualized. IAS/Shunts: No atrial level shunt detected by color flow Doppler. Additional Comments: Ant/Apical/Septal/High Lateral Hypo. Bicusp Severe AS.  LEFT VENTRICLE PLAX 2D LVIDd:         4.50 cm     Diastology LVIDs:         3.30 cm     LV e' medial:    5.66 cm/s LV PW:         1.00 cm     LV E/e' medial:  18.4 LV IVS:        0.80 cm     LV e' lateral:   13.50 cm/s LVOT diam:     2.00 cm     LV E/e' lateral: 7.7 LV SV:         28 LV SV Index:   16 LVOT Area:     3.14 cm  LV Volumes (MOD) LV vol d, MOD A2C: 90.9 ml LV vol d, MOD A4C:  61.0 ml LV vol s, MOD A2C: 63.3 ml LV vol s, MOD A4C: 33.7 ml LV SV MOD A2C:     27.6 ml LV SV MOD A4C:     61.0 ml LV SV MOD BP:      29.5 ml RIGHT VENTRICLE RV Basal diam:  2.50 cm RV S prime:     12.10 cm/s TAPSE (M-mode): 2.6 cm LEFT ATRIUM             Index        RIGHT ATRIUM           Index LA diam:        4.40 cm 2.56 cm/m   RA Area:     16.00 cm LA Vol (A2C):   72.6 ml 42.25 ml/m  RA Volume:   41.80 ml  24.32 ml/m LA Vol (A4C):   52.6 ml 30.61 ml/m LA Biplane Vol: 64.0 ml 37.24 ml/m  AORTIC VALVE AV Area (Vmax):    0.63 cm AV Area (Vmean):   0.51 cm AV Area (VTI):     0.56 cm AV Vmax:           247.67 cm/s AV Vmean:          175.667 cm/s AV VTI:            0.503 m AV Peak Grad:      24.5 mmHg AV Mean Grad:      14.7 mmHg LVOT Vmax:         49.60 cm/s LVOT Vmean:        28.600 cm/s LVOT VTI:          0.090 m LVOT/AV VTI ratio: 0.18  AORTA Ao Root diam: 2.97 cm MITRAL VALVE                TRICUSPID VALVE MV Area (PHT): 4.33 cm  TR Peak grad:   29.2 mmHg MV Area VTI:   0.87 cm     TR Vmax:        270.00 cm/s MV Peak grad:  4.7 mmHg MV Mean grad:  2.0 mmHg     SHUNTS MV Vmax:       1.08 m/s     Systemic VTI:  0.09 m MV Vmean:      69.6 cm/s    Systemic Diam: 2.00 cm MV Decel Time: 175 msec MV E velocity: 104.00 cm/s MV A velocity: 86.50 cm/s MV E/A ratio:  1.20 Dwayne Prince Rome MD Electronically signed by Yolonda Kida MD Signature Date/Time: 08/21/2021/4:07:57 PM    Final      Assessment/Plan: Multiple myeloma status post autologous HCT.  Last one was on 05/20/2020.  On monthly daratumumab.  Respite tract infection with bronchitis.  Now has been proven to have human metapneumovirus infection. Unasyn can be stopped.  As there is no indication of UTI. 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  On heparin drip Followed by cardiology  Diabetes mellitus on sliding scale  Severe nonrheumatic aortic valve stenosis  New onset CHF with low  EF  Discussed the management with the patient and her sister.

## 2021-08-21 NOTE — Assessment & Plan Note (Signed)
CBG elevated. -Continue with SSI -Add Semglee 5 units twice daily -Add 3 units for mealtime coverage -Continue to monitor

## 2021-08-21 NOTE — Progress Notes (Signed)
F[place] NP made aware of troponin 2,239

## 2021-08-21 NOTE — Progress Notes (Signed)
Patient transferred to 2A. Her family is aware.

## 2021-08-21 NOTE — Assessment & Plan Note (Signed)
Patient developed chest pain with shortness of breath.  Elevated troponin which peaked at 2239.  EKG with new LBBB and echocardiogram with newly depressed EF with regional wall motion abnormalities consistent with NSTEMI. Cardiology was consulted and she was started on heparin infusion. -Continue heparin infusion -Continue with metoprolol -Continue with aspirin and statin -Patient will need further ischemic work-up-I will defer that decision to cardiology

## 2021-08-21 NOTE — Hospital Course (Addendum)
Taken from prior notes. ° °Sarah [name] is a 65 y.o. female with a PMH significant for Multiple myeloma s/p autologous stem cell transplant and on maintenance chemotherapy , asthma, DM,  severe nonrheumatic aortic valve stenosis followed by [facility] cardiology, thrombocytopenia, chronic pain from spinal stenosis , chemotherapy induced neuropathy and diarrhea hospitalized in September 2022 with possible CAP and E. faecalis UTI, after presenting with fever, and again in December 2022 with URI ruled out for sepsis.  °They presented from home to the ED on 08/18/2021 with fever of 103, cough, sore throat, hoarseness x 2 days.  ° °Patient met sepsis criteria with fever of 102.9, tachycardia, tachypnea, most likely secondary to viral cause as respiratory viral panel positive for metapneumovirus. ° °Overnight developed chest pain with shortness of breath, elevated troponin which peaked at 2239. °EKG with new LBBB, echocardiogram with new onset reduced EF of 30 to 35% and regional wall motion abnormalities, consistent with NSTEMI.  Cardiology was consulted and she was placed on heparin infusion, pending further ischemic evaluation. ° °2/22: Patient became more short of breath and febrile overnight.  She also developed more chest pain and at times becoming confused persisted.  Able to answer questions appropriately and started today.  Troponin trending down, procalcitonin started going up at 0.4 for now. °Chest x-ray with some bilateral infiltrate, received a dose of IV Lasix for concern of pulmonary edema-mild worsening of renal function. °Started her on CAP coverage, for concern of superadded bacterial infection. ° °2/23: Patient continued to have some cough but chest pain improved.  Now on room air. °She wants to get established with [facility] cardiology, stating that she does not connect with the current cardiologist and would also like to switch from [facility] cardiology too as it is becoming harder for her to go over there.  Message  sent to Dr. Gollan, they will see her as outpatient. ° °2/24: Patient had another febrile episode yesterday evening, fever of 103.3.  Little difficult situation as she is allergic to Tylenol and oncology does not want her to take any Advil.  She was given 1 time dose of Advil with cooling blanket.  Remained afebrile this morning.  Heparin infusion was finished overnight after completing 72 hours and cardiology signed off. °Patient wants to follow-up with [facility] cardiology group as an outpatient. °

## 2021-08-21 NOTE — Progress Notes (Signed)
ANTICOAGULATION CONSULT NOTE  Pharmacy Consult for heparin infusion Indication: ACS/STEMI  Allergies  Allergen Reactions   Oxycodone-Acetaminophen Anaphylaxis    Swelling and rash   Celebrex [Celecoxib] Diarrhea   Codeine    Plerixafor     In 2016 during ASCT collection patient developed fever to 103.67F and required hospitalization   Benadryl [Diphenhydramine] Palpitations   Morphine Itching and Rash   Ondansetron Diarrhea   Tylenol [Acetaminophen] Itching and Rash    Patient Measurements: Height: _0  (157.5 cm) Weight: 70.6 kg (155 lb 10.3 oz) IBW/kg (Calculated) : 50.1 Heparin Dosing Weight: 65  Vital Signs: Temp: 98.3 F (36.8 C) (02/20 2037) Temp Source: Oral (02/20 1715) BP: 122/73 (02/20 2037) Pulse Rate: 81 (02/20 2037)  Labs: Recent Labs    08/18/21 1731 08/18/21 2110 08/20/21 0452 08/20/21 1652 08/20/21 1844 08/20/21 2140 08/20/21 2227  HGB 12.1  --  9.7*  --   --   --   --   HCT 35.9*  --  30.0*  --   --   --   --   PLT 93*  --  68*  --   --   --   --   LABPROT 13.1  --   --   --   --   --   --   INR 1.0  --   --   --   --   --   --   CREATININE 1.12*  --   --   --   --   --   --   TROPONINIHS 7   < >  --    < > 216* 819* 1,156*   < > = values in this interval not displayed.    Estimated Creatinine Clearance: 46.7 mL/min (A) (by C-G formula based on SCr of 1.12 mg/dL (H)).   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   Due to Zometa   Cardiomyopathy, idiopathic ([place]) 08/21/2017   Overview:  Septal and apical hypokinesis with ef 40%   CHF (congestive heart failure) (HCC)    CKD (chronic kidney disease) stage 3, GFR 30-59 ml/min (HCC)    Depression    Diabetes mellitus (HCC)    Dizziness    Fatty  liver    Frequent falls    GERD (gastroesophageal reflux disease)    Gout    Heart murmur    History of blood transfusion    History of bone marrow transplant ([place])    History of uterine fibroid    Hx of cardiac catheterization 06/05/2016   Overview:  Normal coronaries 2017   Hypertension    Hypomagnesemia    IDA (iron deficiency anemia)    Multiple myeloma (HCC)    Personal history of chemotherapy    Renal cyst    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  Baseline Plts = 93 at admission and now 68 prior to start of  heparin infusion  Goal of Therapy:  Heparin level 0.3-0.7 units/ml Monitor platelets by anticoagulation protocol: Yes   Plan:  Due to Lovenox, will bolus 2000 units x 1 Start heparin infusion at 800 unit/hr Ordered INR with AM labs Ordered HL & aPTT 6 hrs after start of infusion. CBC daily while on heparin, will watch Plt closely.  Renda Rolls, PharmD, [facility] 08/21/2021 12:07 AM

## 2021-08-21 NOTE — Progress Notes (Signed)
*  PRELIMINARY RESULTS* Echocardiogram 2D Echocardiogram has been performed.  Sarah [name] 08/21/2021, 1:19 PM

## 2021-08-21 NOTE — Progress Notes (Signed)
CROSS COVER NOTE  NAME: Sarah [name] MRN: [id number] DOB : 1956/01/06   Subjective: Secure chat received from RN reporting anxiety, increased work of breathing, and fever. RED MEWS Temp 103F BP 134/80 HR 124 RR 24 SPO2 95% on 3L via [place].   Objective:   Today's Vitals   08/22/21 0023 08/22/21 0105 08/22/21 0215 08/22/21 0321  BP: (!) 126/100 109/76 104/69   Pulse: (!) 109  97   Resp: (!) 22 (!) 25 16   Temp: 99 F (37.2 C) 99.7 F (37.6 C) 98.6 F (37 C)   TempSrc:      SpO2: 100% 95% 99%   Weight:      Height:      PainSc: 0-No pain 0-No pain  0-No pain   Body mass index is 28.47 kg/m.   Results for orders placed or performed during the hospital encounter of 08/18/21 (from the past 24 hour(s))  Troponin I (High Sensitivity)     Status: Abnormal   Collection Time: 08/21/21  7:25 AM  Result Value Ref Range   Troponin I (High Sensitivity) 2,111 (HH) <18 ng/L  Glucose, capillary     Status: Abnormal   Collection Time: 08/21/21  7:55 AM  Result Value Ref Range   Glucose-Capillary 236 (H) 70 - 99 mg/dL  Troponin I (High Sensitivity)     Status: Abnormal   Collection Time: 08/21/21  9:28 AM  Result Value Ref Range   Troponin I (High Sensitivity) 2,143 (HH) <18 ng/L  Glucose, capillary     Status: Abnormal   Collection Time: 08/21/21 11:51 AM  Result Value Ref Range   Glucose-Capillary 193 (H) 70 - 99 mg/dL  Heparin level (unfractionated)     Status: None   Collection Time: 08/21/21 12:30 PM  Result Value Ref Range   Heparin Unfractionated 0.34 0.30 - 0.70 IU/mL  Glucose, capillary     Status: Abnormal   Collection Time: 08/21/21  5:42 PM  Result Value Ref Range   Glucose-Capillary 129 (H) 70 - 99 mg/dL  Glucose, capillary     Status: Abnormal   Collection Time: 08/21/21  9:39 PM  Result Value Ref Range   Glucose-Capillary 108 (H) 70 - 99 mg/dL   *Note: Due to a large number of results and/or encounters for the requested time period, some results have not  been displayed. A complete set of results can be found in Results Review.      DG Chest 1 View  Result Date: 08/21/2021 CLINICAL DATA:  Dyspnea. EXAM: CHEST  1 VIEW COMPARISON:  Radiograph 08/20/2021, yesterday.  CT 08/18/2021 FINDINGS: Right chest port in place. Stable heart size and mediastinal contours. Vascular congestion and perivascular haziness suspicious for pulmonary edema, not significantly changed from prior. Suspected small bilateral pleural effusions are similar. Small perifissural right upper lobe opacity. No pneumothorax. IMPRESSION: Findings suspicious for pulmonary edema and small pleural effusions. Perifissural opacity in the right upper lobe may be related to fluid in the fissure or adjacent airspace disease. Electronically Signed   By: Keith Rake M.D.   On: 08/21/2021 22:49   ECHOCARDIOGRAM COMPLETE  Result Date: 08/21/2021    ECHOCARDIOGRAM REPORT   Patient Name:   Sarah [name] Date of Exam: 08/21/2021 Medical Rec #:  [id number]        Height:       62.0 in Accession #:    [id number]       Weight:       155.6  lb Date of Birth:  04/27/1956         BSA:          1.718 m Patient Age:    22 years         BP:           138/85 mmHg Patient Gender: F                HR:           91 bpm. Exam Location:  [facility] Procedure: 2D Echo, Cardiac Doppler and Color Doppler Indications:     Chest pain R07.9  History:         Patient has prior history of Echocardiogram examinations, most                  recent 11/16/2019. Cardiomyopathy; Risk Factors:Diabetes and                  Hypertension. Bicuspid AOV.  Sonographer:     Sherrie Sport Referring Phys:  [id number] [place] L Muriel Hannold Diagnosing Phys: Yolonda Kida MD  Sonographer Comments: Suboptimal apical window. IMPRESSIONS  1. Ant/Apical/Septal/High Lateral Hypo.  2. Bicusp Severe AS.  3. Left ventricular ejection fraction, by estimation, is 35 to 40%. The left ventricle has moderately decreased function. The left ventricle demonstrates regional wall  motion abnormalities (see scoring diagram/findings for description). Left ventricular  diastolic parameters were normal.  4. Right ventricular systolic function is normal. The right ventricular size is normal.  5. Left atrial size was mildly dilated.  6. The mitral valve is normal in structure. Mild mitral valve regurgitation.  7. The aortic valve is bicuspid. There is moderate calcification of the aortic valve. There is moderate thickening of the aortic valve. Aortic valve regurgitation is trivial. Severe aortic valve stenosis. FINDINGS  Left Ventricle: Left ventricular ejection fraction, by estimation, is 35 to 40%. The left ventricle has moderately decreased function. The left ventricle demonstrates regional wall motion abnormalities. The left ventricular internal cavity size was normal in size. There is no left ventricular hypertrophy. Left ventricular diastolic parameters were normal. Right Ventricle: The right ventricular size is normal. No increase in right ventricular wall thickness. Right ventricular systolic function is normal. Left Atrium: Left atrial size was mildly dilated. Right Atrium: Right atrial size was normal in size. Pericardium: There is no evidence of pericardial effusion. Mitral Valve: The mitral valve is normal in structure. Mild mitral valve regurgitation. MV peak gradient, 4.7 mmHg. The mean mitral valve gradient is 2.0 mmHg. Tricuspid Valve: The tricuspid valve is normal in structure. Tricuspid valve regurgitation is mild. Aortic Valve: The aortic valve is bicuspid. There is moderate calcification of the aortic valve. There is moderate thickening of the aortic valve. There is moderate aortic valve annular calcification. Aortic valve regurgitation is trivial. Severe aortic stenosis is present. Aortic valve mean gradient measures 14.7 mmHg. Aortic valve peak gradient measures 24.5 mmHg. Aortic valve area, by VTI measures 0.56 cm. Pulmonic Valve: The pulmonic valve was normal in structure.  Pulmonic valve regurgitation is trivial. Aorta: The ascending aorta was not well visualized. IAS/Shunts: No atrial level shunt detected by color flow Doppler. Additional Comments: Ant/Apical/Septal/High Lateral Hypo. Bicusp Severe AS.  LEFT VENTRICLE PLAX 2D LVIDd:         4.50 cm     Diastology LVIDs:         3.30 cm     LV e' medial:    5.66 cm/s LV PW:  1.00 cm     LV E/e' medial:  18.4 LV IVS:        0.80 cm     LV e' lateral:   13.50 cm/s LVOT diam:     2.00 cm     LV E/e' lateral: 7.7 LV SV:         28 LV SV Index:   16 LVOT Area:     3.14 cm  LV Volumes (MOD) LV vol d, MOD A2C: 90.9 ml LV vol d, MOD A4C: 61.0 ml LV vol s, MOD A2C: 63.3 ml LV vol s, MOD A4C: 33.7 ml LV SV MOD A2C:     27.6 ml LV SV MOD A4C:     61.0 ml LV SV MOD BP:      29.5 ml RIGHT VENTRICLE RV Basal diam:  2.50 cm RV S prime:     12.10 cm/s TAPSE (M-mode): 2.6 cm LEFT ATRIUM             Index        RIGHT ATRIUM           Index LA diam:        4.40 cm 2.56 cm/m   RA Area:     16.00 cm LA Vol (A2C):   72.6 ml 42.25 ml/m  RA Volume:   41.80 ml  24.32 ml/m LA Vol (A4C):   52.6 ml 30.61 ml/m LA Biplane Vol: 64.0 ml 37.24 ml/m  AORTIC VALVE AV Area (Vmax):    0.63 cm AV Area (Vmean):   0.51 cm AV Area (VTI):     0.56 cm AV Vmax:           247.67 cm/s AV Vmean:          175.667 cm/s AV VTI:            0.503 m AV Peak Grad:      24.5 mmHg AV Mean Grad:      14.7 mmHg LVOT Vmax:         49.60 cm/s LVOT Vmean:        28.600 cm/s LVOT VTI:          0.090 m LVOT/AV VTI ratio: 0.18  AORTA Ao Root diam: 2.97 cm MITRAL VALVE                TRICUSPID VALVE MV Area (PHT): 4.33 cm     TR Peak grad:   29.2 mmHg MV Area VTI:   0.87 cm     TR Vmax:        270.00 cm/s MV Peak grad:  4.7 mmHg MV Mean grad:  2.0 mmHg     SHUNTS MV Vmax:       1.08 m/s     Systemic VTI:  0.09 m MV Vmean:      69.6 cm/s    Systemic Diam: 2.00 cm MV Decel Time: 175 msec MV E velocity: 104.00 cm/s MV A velocity: 86.50 cm/s MV E/A ratio:  1.20 Dwayne D Callwood MD  Electronically signed by Yolonda Kida MD Signature Date/Time: 08/21/2021/4:07:57 PM    Final        Physical Exam:   General: Adult female, acutely ill, lying in bed HEENT: MM pink/moist, normocephalic, atraumatic Neuro: A&O x 3 follows commands, PERRL  CV: s1s2, RRR, no r/m/g Pulm: Regular, non labored on 3L Nasal cannula , breath sounds Rhonchorous-BUL & Mild crackles-BLL. Dyspnea with conversation GI: soft, non-distended, non-tender, bs active x 4 GU:  external foley in place  with clear amber urine Skin:  no rashes/lesions noted Extremities: warm/dry, pulses + 2 R/P, no edema noted    Assessment/Plan:   Dyspnea - PRN albuterol - Ativan - CXR - 40 mg IV Lasix  Fever - Ibuprofen 400 mg - Remove fleece jacket and blankets   Josephina Shih, MSN, FNP-BC Nurse Practitioner [facility] Pager [phone]

## 2021-08-21 NOTE — Progress Notes (Addendum)
Patient transferred to 2A.  On telemetry box 13.  No complaints of pain.  VSS.  Heparin gtt at 44ml/hr

## 2021-08-21 NOTE — Assessment & Plan Note (Addendum)
Recently received transforaminal epidural steroid injection as outpatient. -Continue with pain management -Continue with Lyrica

## 2021-08-21 NOTE — Progress Notes (Signed)
Patient's troponin is trending up, received verbal order for telemetry. Patient is resting comfortably in bed. V/S stable, no c/o chest pain.   Discussed level of care with Charge RN

## 2021-08-21 NOTE — Progress Notes (Signed)
[place] NP made aware of troponin 1,569

## 2021-08-21 NOTE — Progress Notes (Signed)
Progress Note   Patient: Sarah [name] PPJ:[id number] DOB: 08-31-1955 DOA: 08/18/2021     3 DOS: the patient was seen and examined on 08/21/2021   Brief hospital course: Taken from prior notes.  Sarah [name] is a 66 y.o. female with a PMH significant for Multiple myeloma s/p autologous stem cell transplant and on maintenance chemotherapy , asthma, DM,  severe nonrheumatic aortic valve stenosis followed by [facility] cardiology, thrombocytopenia, chronic pain from spinal stenosis , chemotherapy induced neuropathy and diarrhea hospitalized in September 2022 with possible CAP and E. faecalis UTI, after presenting with fever, and again in December 2022 with URI ruled out for sepsis.  They presented from home to the ED on 08/18/2021 with fever of 103, cough, sore throat, hoarseness x 2 days.   Patient met sepsis criteria with fever of 102.9, tachycardia, tachypnea, most likely secondary to viral cause as respiratory viral panel positive for metapneumovirus.  Overnight developed chest pain with shortness of breath, elevated troponin which peaked at 2239. EKG with new LBBB, echocardiogram with new onset reduced EF of 30 to 35% and regional wall motion abnormalities, consistent with NSTEMI.  Cardiology was consulted and she was placed on heparin infusion, pending further ischemic evaluation.   Assessment and Plan: * Sepsis ([place])- (present on admission) Met sepsis criteria with fever, tachycardia, tachypnea, leukocytosis and respiratory viral panel positive for metapneumovirus.  He received IV antibiotics which were later discontinued. -Currently on acyclovir  NSTEMI (non-ST elevated myocardial infarction) [facility]) Patient developed chest pain with shortness of breath.  Elevated troponin which peaked at 2239.  EKG with new LBBB and echocardiogram with newly depressed EF with regional wall motion abnormalities consistent with NSTEMI. Cardiology was consulted and she was started on heparin  infusion. -Continue heparin infusion -Continue with metoprolol -Continue with aspirin and statin -Patient will need further ischemic work-up-I will defer that decision to cardiology  Multiple myeloma not having achieved remission ([place])- (present on admission) S/p autologous bone marrow transplant. Oncology on board and they were holding maintenance injections of daratumumab due to acute infection. -Continue to monitor  Thrombocytopenia ([place])- (present on admission) Acute on chronic thrombocytopenia, likely due to acute infection. -Continue to monitor -Oncology on board  Type II diabetes mellitus with complication ([place])- (present on admission) CBG elevated. -Continue with SSI -Add Semglee 5 units twice daily -Add 3 units for mealtime coverage -Continue to monitor  Severe aortic stenosis Echocardiogram with bicuspid aortic valve. Being managed by [facility] cardiology.  Spinal stenosis of lumbar region with neurogenic claudication- (present on admission) Recently received transforaminal epidural steroid injection as outpatient. -Continue with pain management -Continue with Lyrica   Subjective: Patient continued to have some shortness of breath.  Denies any more chest pain.  Sister at bedside.  Did had some chest pain overnight.  Physical Exam: Vitals:   08/21/21 0845 08/21/21 1148 08/21/21 1429 08/21/21 1500  BP:  125/90  125/90  Pulse:  85  90  Resp:  19  20  Temp:  98.3 F (36.8 C)  98.6 F (37 C)  TempSrc:  Oral  Oral  SpO2: 99% 99% 95% 100%  Weight:      Height:       General.  Well-developed lady, in no acute distress. Pulmonary.  Lungs clear bilaterally, normal respiratory effort. CV.  Regular rate and rhythm, no JVD, rub or murmur. Abdomen.  Soft, nontender, nondistended, BS positive. CNS.  Alert and oriented x3.  No focal neurologic deficit. Extremities.  No edema, no cyanosis, pulses intact  and symmetrical. Psychiatry.  Judgment and insight appears  normal.  Data Reviewed: Prior notes, labs and imaging reviewed.  Family Communication: Discussed with sister at bedside  Disposition: Status is: Inpatient Remains inpatient appropriate because: Severity of illness   Planned Discharge Destination: Home with Home Health  DVT prophylaxis.  Heparin infusion.  Time spent: 55 minutes  This record has been created using Systems analyst. Errors have been sought and corrected,but may not always be located. Such creation errors do not reflect on the standard of care.  Author: Lorella Nimrod, MD 08/21/2021 6:05 PM  For on call review [email or web address].

## 2021-08-21 NOTE — Telephone Encounter (Signed)
Please advise on r/s.

## 2021-08-21 NOTE — Assessment & Plan Note (Signed)
Echocardiogram with bicuspid aortic valve. Being managed by [facility] cardiology.

## 2021-08-21 NOTE — Assessment & Plan Note (Signed)
Met sepsis criteria with fever, tachycardia, tachypnea, leukocytosis and respiratory viral panel positive for metapneumovirus.  He received IV antibiotics which were later discontinued. -Currently on acyclovir

## 2021-08-21 NOTE — Telephone Encounter (Signed)
Pt is currently admitted to ICU. She has appointments scheduled  for 2/22 and 2/23 thay need to be canceled. Please advise on reschedule.

## 2021-08-21 NOTE — Progress Notes (Signed)
Hematology/Oncology Progress note [phone] [phone]     Patient Care Team: Glean Hess, MD as PCP - General (Internal Medicine) Jodell Cipro, MD as Referring Physician (Pain Medicine) Jennye Boroughs, MD as Consulting Physician (Cardiology) Earlie Server, MD as Consulting Physician (Oncology)   Name of the patient: Sarah [name]  [id number]  [birth year]  Date of visit: 08/21/21   INTERVAL HISTORY-   Patient developed shortness of breath, diaphoresis, chest pain last night.  Troponin was elevated at 216.  She was evaluated by night coverage hospitalist team.IV fluid was stopped.  Chest x-ray showed pulmonary edema.  Patient received  diuretics.  Patient has baseline cardiomegaly, aortic stenosis, bicuspid aortic valve.  Troponin continues to trend up.  She was started on heparin drip for non-STEMI treatments. Today patient was seen at the bedside.  She denies chest pain currently.  Shortness of breath has improved.   Allergies  Allergen Reactions   Oxycodone-Acetaminophen Anaphylaxis    Swelling and rash   Celebrex [Celecoxib] Diarrhea   Codeine    Plerixafor     In 2016 during ASCT collection patient developed fever to 103.32F and required hospitalization   Benadryl [Diphenhydramine] Palpitations   Morphine Itching and Rash   Ondansetron Diarrhea   Tylenol [Acetaminophen] Itching and Rash    Patient Active Problem List   Diagnosis Date Noted   Low immunoglobulin level    Bronchitis    Fever    Acute gingivitis 06/24/2021   URI (upper respiratory infection) 06/23/2021   Chronic diarrhea 06/23/2021   Multiple myeloma in remission ([place]) 05/14/2021   Leg swelling 04/02/2021   Neuropathy 04/02/2021   Fever in adult 03/05/2021   Frequent PVCs 12/04/2020   Diarrhea 10/25/2020   Encounter for antineoplastic chemotherapy 08/31/2020   Coronary artery disease involving native coronary artery of native heart 04/26/2020   Yeast vaginitis  03/18/2020   Hypokalemia 03/11/2020   UTI (urinary tract infection) 03/03/2020   Dyspnea 03/02/2020   Physical deconditioning 02/07/2020   Impaired nutrition 02/07/2020   Sepsis ([place]) 11/13/2019   Encounter for antineoplastic immunotherapy 10/03/2019   Hypocalcemia 08/28/2019   Chronic nausea 12/17/2018   Chemotherapy-induced neuropathy ([place]) 12/17/2018   Hyperlipidemia associated with type 2 diabetes mellitus ([place]) 11/25/2018   Anemia 10/21/2018   Hypomagnesemia 08/20/2018   Goals of care, counseling/discussion 08/20/2018   B12 deficiency 08/20/2018   Thrombocytopenia ([place]) 02/09/2018   Benign essential HTN 08/21/2017   Carotid artery stenosis, asymptomatic, bilateral 08/06/2017   Thyroid nodule 08/06/2017   Iron deficiency anemia due to chronic blood loss 05/13/2017   Spinal stenosis of lumbar region with neurogenic claudication 04/09/2017   Long term prescription opiate use 09/07/2016   Chronic pain syndrome 07/08/2016   Pain medication agreement signed 07/08/2016   Spondylosis of lumbar region without myelopathy or radiculopathy 07/08/2016   Severe aortic stenosis 06/05/2016   Environmental and seasonal allergies 04/19/2016   Insomnia 04/19/2016   Asthma 04/19/2016   Aortic regurgitation 04/19/2016   Type II diabetes mellitus with complication ([place]) 67/54/4920   Depression, major, single episode, in partial remission ([place]) 04/12/2016   Irritable bowel syndrome (IBS) 04/12/2016   Hepatic steatosis 04/12/2016   Multiple myeloma not having achieved remission ([place]) 04/02/2016   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[place]) 07/06/2015   Stem cell transplant candidate 05/16/2015     Past Medical History:  Diagnosis Date   Abnormal stress test 02/14/2016   Overview:  Added automatically from request for surgery [id number]   Anemia  Anxiety    Arthritis    Bicuspid aortic valve    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   Due to Zometa    Cardiomyopathy, idiopathic ([place]) 08/21/2017   Overview:  Septal and apical hypokinesis with ef 40%   CHF (congestive heart failure) (HCC)    CKD (chronic kidney disease) stage 3, GFR 30-59 ml/min (HCC)    Depression    Diabetes mellitus (HCC)    Dizziness    Fatty liver    Frequent falls    GERD (gastroesophageal reflux disease)    Gout    Heart murmur    History of blood transfusion    History of bone marrow transplant ([place])    History of uterine fibroid    Hx of cardiac catheterization 06/05/2016   Overview:  Normal coronaries 2017   Hypertension    Hypomagnesemia    IDA (iron deficiency anemia)    Multiple myeloma ([place])    Personal history of chemotherapy    Renal cyst    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[place]) 06/05/2016   Overview:  Sp ablation 1999     Past Surgical History:  Procedure Laterality Date   ABDOMINAL HYSTERECTOMY     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   CARDIAC ELECTROPHYSIOLOGY MAPPING AND ABLATION     CARPAL TUNNEL RELEASE Bilateral    CHOLECYSTECTOMY  2008   COLONOSCOPY WITH PROPOFOL N/A 05/07/2017   Procedure: COLONOSCOPY WITH PROPOFOL;  Surgeon: [name], MD;  Location: [facility] ENDOSCOPY;  Service: Gastroenterology;  Laterality: N/A;   COLONOSCOPY WITH PROPOFOL N/A 12/19/2020   Procedure: COLONOSCOPY WITH PROPOFOL;  Surgeon: [name], MD;  Location: [facility] ENDOSCOPY;  Service: Gastroenterology;  Laterality: N/A;   ESOPHAGOGASTRODUODENOSCOPY (EGD) WITH PROPOFOL N/A 05/07/2017   Procedure: ESOPHAGOGASTRODUODENOSCOPY (EGD) WITH PROPOFOL;  Surgeon: [name], MD;  Location: [facility] ENDOSCOPY;  Service: Gastroenterology;  Laterality: N/A;   FOOT SURGERY Bilateral    INCONTINENCE SURGERY  2009   INTERSTIM IMPLANT PLACEMENT     other     over active bladder   OTHER SURGICAL HISTORY     bladder stimulator    PARTIAL HYSTERECTOMY  03/1996   fibroids   PORTA CATH INSERTION N/A 03/10/2019   Procedure: PORTA CATH INSERTION;  Surgeon: Algernon Huxley, MD;  Location: [place] CV LAB;  Service: Cardiovascular;  Laterality: N/A;   TONSILLECTOMY  2007    Social History   Socioeconomic History   Marital status: Widowed    Spouse name: Not on file   Number of children: Not on file   Years of education: Not on file   Highest education level: Not on file  Occupational History   Occupation: Disabled  Tobacco Use   Smoking status: Former    Packs/day: 1.00    Years: 20.00    Pack years: 20.00    Types: Cigarettes    Quit date: 07/02/1991    Years since quitting: 30.1   Smokeless tobacco: Never  Vaping Use   Vaping Use: Never used  Substance and Sexual Activity   Alcohol use: Yes    Comment: occasionally   Drug use: No   Sexual activity: Never  Other Topics Concern   Not on file  Social History Narrative   Pt lives with her 2 sisters   Social Determinants of Health   Financial Resource Strain: Not on file  Food Insecurity: Not on file  Transportation Needs: Not on file  Physical Activity: Not on file  Stress: Not on file  Social Connections: Not on file  Intimate Partner Violence: Not on file     Family History  Problem Relation Age of Onset   Colon cancer Father    Renal Disease Father    Diabetes Mellitus II Father    Melanoma Paternal Grandmother    Breast cancer Maternal Aunt 58   Anemia Mother    Heart disease Mother    Heart failure Mother    Renal Disease Mother    Congestive Heart Failure Mother    Heart disease Maternal Uncle    Throat cancer Maternal Uncle    Lung cancer Maternal Uncle    Liver disease Maternal Uncle    Heart failure Maternal Uncle    Hearing loss Son 46       Suicide      Current Facility-Administered Medications:    acyclovir (ZOVIRAX) 200 MG capsule 400 mg, 400 mg, Oral, BID, Doristine Mango L, MD, 400 mg at 08/21/21 1035   albuterol (PROVENTIL) (2.5 MG/3ML) 0.083% nebulizer solution 2.5 mg, 2.5 mg, Nebulization, Q4H PRN, Foust, Katy L, NP, 2.5 mg at 08/20/21 1632   Ampicillin-Sulbactam  (UNASYN) 3 g in sodium chloride 0.9 % 100 mL IVPB, 3 g, Intravenous, Q6H, Richarda Osmond, MD, Last Rate: 200 mL/hr at 08/21/21 1213, 3 g at 08/21/21 1213   benzonatate (TESSALON) capsule 200 mg, 200 mg, Oral, TID PRN, Foust, Katy L, NP, 200 mg at 08/20/21 1935   Chlorhexidine Gluconate Cloth 2 % PADS 6 each, 6 each, Topical, Daily, Richarda Osmond, MD, 6 each at 08/21/21 1035   dextromethorphan-guaiFENesin ([place] DM) 30-600 MG per 12 hr tablet 1 tablet, 1 tablet, Oral, BID, Anwar, Shayan S, DO, 1 tablet at 08/21/21 1035   diclofenac Sodium (VOLTAREN) 1 % topical gel 2 g, 2 g, Topical, QID PRN, Richarda Osmond, MD   diphenhydrAMINE (BENADRYL) capsule 50 mg, 50 mg, Oral, Once PRN, Earlie Server, MD   FLUoxetine (PROZAC) capsule 40 mg, 40 mg, Oral, Daily, Richarda Osmond, MD, 40 mg at 08/21/21 1035   heparin ADULT infusion 100 units/mL (25000 units/213m), 800 Units/hr, Intravenous, Continuous, Belue, NAlver Sorrow RPH, Last Rate: 8 mL/hr at 08/21/21 0321, 800 Units/hr at 08/21/21 0321   insulin aspart (novoLOG) injection 0-15 Units, 0-15 Units, Subcutaneous, TID WC, DAthena Masse MD, 3 Units at 08/21/21 1210   ipratropium-albuterol (DUONEB) 0.5-2.5 (3) MG/3ML nebulizer solution 3 mL, 3 mL, Nebulization, Q6H, Anwar, Shayan S, DO, 3 mL at 08/21/21 0845   loperamide (IMODIUM) capsule 2 mg, 2 mg, Oral, BID PRN, ARicharda Osmond MD, 2 mg at 08/19/21 2029   magnesium chloride (SLOW-MAG) 64 MG SR tablet 64 mg, 1 tablet, Oral, BID, YEarlie Server MD   MEDLINE mouth rinse, 15 mL, Mouth Rinse, BID, ALorella Nimrod MD, 15 mL at 08/21/21 1210   menthol-cetylpyridinium (CEPACOL) lozenge 3 mg, 1 lozenge, Oral, PRN, DAthena Masse MD, 3 mg at 08/19/21 1708   metoprolol tartrate (LOPRESSOR) tablet 12.5 mg, 12.5 mg, Oral, BID, Foust, Katy L, NP, 12.5 mg at 08/21/21 1035   montelukast (SINGULAIR) tablet 10 mg, 10 mg, Oral, Daily PRN, ARicharda Osmond MD   multivitamin with minerals tablet 1 tablet, 1  tablet, Oral, Daily, Anwar, Shayan S, DO, 1 tablet at 08/21/21 1035   nitroGLYCERIN (NITROSTAT) SL tablet 0.4 mg, 0.4 mg, Sublingual, Q5 min PRN, Foust, Katy L, NP   ondansetron (ZOFRAN) tablet 4 mg, 4 mg, Oral, Q6H PRN **OR** ondansetron (ZOFRAN) injection 4 mg, 4 mg, Intravenous,  Q6H PRN, Athena Masse, MD, 4 mg at 08/21/21 0013   pantoprazole (PROTONIX) EC tablet 80 mg, 80 mg, Oral, Daily, Imagene Sheller S, DO, 80 mg at 08/21/21 0814   promethazine (PHENERGAN) tablet 12.5 mg, 12.5 mg, Oral, Q6H PRN, Richarda Osmond, MD, 12.5 mg at 08/20/21 1928   simethicone (MYLICON) chewable tablet 160 mg, 160 mg, Oral, QID, Anwar, Shayan S, DO, 160 mg at 08/21/21 1035   sodium chloride flush (NS) 0.9 % injection 10-40 mL, 10-40 mL, Intracatheter, PRN, Richarda Osmond, MD   tiZANidine (ZANAFLEX) tablet 2 mg, 2 mg, Oral, Q6H PRN, Richarda Osmond, MD, 2 mg at 08/20/21 1928   traZODone (DESYREL) tablet 100 mg, 100 mg, Oral, QHS, Anwar, Shayan S, DO  Facility-Administered Medications Ordered in Other Encounters:    sodium chloride flush (NS) 0.9 % injection 10 mL, 10 mL, Intravenous, PRN, Earlie Server, MD, 10 mL at 04/02/21 0904   Physical exam:  Vitals:   08/21/21 0217 08/21/21 0523 08/21/21 0845 08/21/21 1148  BP:  138/85  125/90  Pulse:  91  85  Resp:  17  19  Temp:  98 F (36.7 C)  98.3 F (36.8 C)  TempSrc:    Oral  SpO2: 97% 99% 99% 99%  Weight:      Height:       Physical Exam Constitutional:      General: She is not in acute distress.    Appearance: She is not diaphoretic.  HENT:     Head: Normocephalic and atraumatic.     Nose: Nose normal.     Mouth/Throat:     Pharynx: No oropharyngeal exudate.  Eyes:     General: No scleral icterus.    Pupils: Pupils are equal, round, and reactive to light.  Cardiovascular:     Rate and Rhythm: Normal rate and regular rhythm.     Heart sounds: No murmur heard. Pulmonary:     Effort: Pulmonary effort is normal. No respiratory distress.      Breath sounds: Rhonchi present.  Abdominal:     General: There is no distension.     Palpations: Abdomen is soft.     Tenderness: There is no abdominal tenderness.  Musculoskeletal:        General: Normal range of motion.     Cervical back: Normal range of motion and neck supple.  Skin:    General: Skin is warm and dry.     Findings: No erythema.  Neurological:     Mental Status: She is alert and oriented to person, place, and time.     Cranial Nerves: No cranial nerve deficit.     Motor: No abnormal muscle tone.     Coordination: Coordination normal.  Psychiatric:        Mood and Affect: Affect normal.       CMP Latest Ref Rng & Units 08/21/2021  Glucose 70 - 99 mg/dL 255(H)  BUN 8 - 23 mg/dL 18  Creatinine 0.44 - 1.00 mg/dL 1.08(H)  Sodium 135 - 145 mmol/L 136  Potassium 3.5 - 5.1 mmol/L 3.7  Chloride 98 - 111 mmol/L 103  CO2 22 - 32 mmol/L 19(L)  Calcium 8.9 - 10.3 mg/dL 8.0(L)  Total Protein 6.5 - 8.1 g/dL -  Total Bilirubin 0.3 - 1.2 mg/dL -  Alkaline Phos 38 - 126 U/L -  AST 15 - 41 U/L -  ALT 0 - 44 U/L -   CBC Latest Ref Rng & Units 08/21/2021  WBC 4.0 - 10.5 K/uL 6.5  Hemoglobin 12.0 - 15.0 g/dL 10.4(L)  Hematocrit 36.0 - 46.0 % 31.8(L)  Platelets 150 - 400 K/uL 89(L)    RADIOGRAPHIC STUDIES: I have personally reviewed the radiological images as listed and agreed with the findings in the report. DG Chest 1 View  Result Date: 08/19/2021 CLINICAL DATA:  Dyspnea. EXAM: CHEST  1 VIEW COMPARISON:  August 18, 2021 FINDINGS: There is stable right-sided venous Port-A-Cath positioning. The heart size and mediastinal contours are within normal limits. Both lungs are clear. Multilevel degenerative changes are seen throughout the thoracic spine. IMPRESSION: No active disease. Electronically Signed   By: Virgina Norfolk M.D.   On: 08/19/2021 21:19   DG Chest 2 View  Result Date: 08/18/2021 CLINICAL DATA:  Shortness of breath, cough, fever, suspected sepsis,  multiple myeloma EXAM: CHEST - 2 VIEW COMPARISON:  08/17/2021 FINDINGS: The heart size and mediastinal contours are within normal limits. Both lungs are clear. Disc degenerative disease of the thoracic spine. IMPRESSION: No acute abnormality of the lungs. Electronically Signed   By: Delanna Ahmadi M.D.   On: 08/18/2021 18:13   DG Chest 2 View  Result Date: 08/17/2021 CLINICAL DATA:  Cough and low-grade fever. Multiple myeloma. Port in place. EXAM: CHEST - 2 VIEW COMPARISON:  06/23/2021 FINDINGS: Right chest wall porta catheter tip overlies the superior vena cava/right atrial junction. Cardiac silhouette and mediastinal contours are within normal limits. The lungs are clear. No pleural effusion or pneumothorax. Mild-to-moderate multilevel degenerative disc changes of the thoracic spine. Cholecystectomy clips. IMPRESSION: No active cardiopulmonary disease. Electronically Signed   By: Yvonne Kendall M.D.   On: 08/17/2021 13:56   CT Angio Chest PE W and/or Wo Contrast  Result Date: 08/18/2021 CLINICAL DATA:  Pulmonary embolism suspected, high probability. Shortness of breath and fever beginning 3 days ago. EXAM: CT ANGIOGRAPHY CHEST WITH CONTRAST TECHNIQUE: Multidetector CT imaging of the chest was performed using the standard protocol during bolus administration of intravenous contrast. Multiplanar CT image reconstructions and MIPs were obtained to evaluate the vascular anatomy. RADIATION DOSE REDUCTION: This exam was performed according to the departmental dose-optimization program which includes automated exposure control, adjustment of the mA and/or kV according to patient size and/or use of iterative reconstruction technique. CONTRAST:  53m OMNIPAQUE IOHEXOL 350 MG/ML SOLN COMPARISON:  Chest radiography same day. Prior CT angiography 03/17/2020 FINDINGS: Cardiovascular: The heart is enlarged. No pericardial fluid. No visible coronary artery calcification or aortic atherosclerotic calcification. Pulmonary  arterial opacification is moderate. No pulmonary emboli are seen. Mediastinum/Nodes: Chronic calcified mediastinal lymph nodes as noted previously. Lungs/Pleura: No pulmonary infiltrate, collapse or effusion. Patient does have some scattered areas of bronchial thickening. No pleural effusion. Upper Abdomen: Fatty change of the liver. No focal lesion is visible. Previous cholecystectomy. Musculoskeletal: Negative Review of the MIP images confirms the above findings. IMPRESSION: No pulmonary emboli. Mild cardiomegaly as seen previously. Possible bronchitis pattern. No pulmonary consolidation or collapse. No pleural effusion. Chronic calcified mediastinal lymph nodes. Electronically Signed   By: MNelson ChimesM.D.   On: 08/18/2021 20:08   UKoreaAbdomen Complete  Result Date: 07/26/2021 CLINICAL DATA:  66year old female with abnormal LFTs. Prior cholecystectomy. EXAM: ABDOMEN ULTRASOUND COMPLETE COMPARISON:  CT Abdomen and Pelvis 03/05/2021, 10/16/2016. Renal ultrasound 01/24/2020. FINDINGS: Gallbladder: Surgically absent. Common bile duct: Diameter: 5 mm, normal. Liver: Echogenic liver (image 16). No discrete liver lesion. No intrahepatic biliary ductal dilatation. Portal vein is patent on color Doppler imaging with normal direction of blood  flow towards the liver. IVC: No abnormality visualized. Pancreas: Visualized portion unremarkable. Spleen: Size and appearance within normal limits. Right Kidney: Length: 10.2 cm. Echogenicity within normal limits. No mass or hydronephrosis visualized. Left Kidney: Length: 9.3 cm. Small complex echogenic midpole lesion (image 60). Was not apparent on the 2021 ultrasound, but probably corresponds to a small 12 mm hypodense heterogeneous area on the CT last year, which was present in 2018. This is 11 x 15 x 17 mm by ultrasound. There is some comet tail artifact in this area by ultrasound now (image 64). No other left renal mass. No hydronephrosis. Preserved left renal cortical  echogenicity. Abdominal aorta: No aneurysm visualized. Other findings: None. IMPRESSION: 1. Evidence of hepatic steatosis. Absent gallbladder no evidence of bile duct obstruction. 2. Small up to 1.7 cm complex, mixed echogenic lesion of the left kidney. This might reflect dystrophic calcification of a chronic cyst seen previously by CT, uncertain. But it was not apparent by prior renal ultrasound, most recently in 2021. Recommend dedicated Renal Mass Protocol Abdomen MRI or CT (without and with contrast) to best characterize. Electronically Signed   By: Genevie Ann M.D.   On: 07/26/2021 11:23   DG Chest Port 1 View  Result Date: 08/20/2021 CLINICAL DATA:  Shortness of breath, cough EXAM: PORTABLE CHEST 1 VIEW COMPARISON:  Previous studies including the examination of 08/19/2021 FINDINGS: Transverse diameter of heart is within normal limits. Tip of right IJ chest port is seen in the superior vena cava close to the right atrium. Central pulmonary vessels are more prominent. There is interval increase in interstitial markings in both parahilar regions and both lower lung fields. There is no focal consolidation. There is minimal blunting of lateral CP angles. There is no pneumothorax. IMPRESSION: Central pulmonary vessels are more prominent suggesting CHF. Increased interstitial markings are seen in both lungs suggesting interstitial pulmonary edema. Less likely possibility would be interstitial pneumonia. Minimal bilateral pleural effusions. Electronically Signed   By: Elmer Picker M.D.   On: 08/20/2021 17:59    Assessment and plan-   # Sepsis, likely secondary to upper respiratory infection/bronchitis.   Respiratory panel by PCR showed meta pneumo virus. Appreciate ID input.  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  #Non-STEMI, appreciate cardiology recommendation.  On heparin drip now.   #Chronic hypomagnesia, patient gets magnesium infusion weekly outpatient.  She also takes  Slow-Mag.  Added to her med list.  Check magnesium in a.m.  #Multiple myeloma status post bone marrow transplant. Myeloma has been in remission and she is on daratumumab injection maintenance.  We will hold due to acute infection.   Acute on chronic thrombocytopenia, likely secondary to acute infection.  Continue to monitor.    Thank you for allowing me to participate in the care of this patient.   Earlie Server, MD, PhD Hematology Oncology 08/21/2021

## 2021-08-21 NOTE — Assessment & Plan Note (Signed)
Acute on chronic thrombocytopenia, likely due to acute infection. -Continue to monitor -Oncology on board

## 2021-08-21 NOTE — Progress Notes (Signed)
°   08/21/21 2158  Assess: MEWS Score  Temp (!) 103 F (39.4 C)  BP 125/79  Pulse Rate 94  ECG Heart Rate (!) 113  Resp 20  SpO2 96 %  O2 Device Nasal Cannula  O2 Flow Rate (L/min) 2 L/min  Assess: MEWS Score  MEWS Temp 2  MEWS Systolic 0  MEWS Pulse 2  MEWS RR 0  MEWS LOC 0  MEWS Score 4  MEWS Score Color Red  Assess: if the MEWS score is Yellow or Red  Were vital signs taken at a resting state? Yes  Focused Assessment Change from prior assessment (see assessment flowsheet)  Does the patient meet 2 or more of the SIRS criteria? Yes  Does the patient have a confirmed or suspected source of infection? Yes  Provider and Rapid Response Notified? No  MEWS guidelines implemented *See Row Information* Yes  Treat  MEWS Interventions Administered scheduled meds/treatments;Administered prn meds/treatments;Consulted Respiratory Therapy  Pain Scale 0-10  Pain Score 0  Take Vital Signs  Increase Vital Sign Frequency  Red: Q 1hr X 4 then Q 4hr X 4, if remains red, continue Q 4hrs  Escalate  MEWS: Escalate Red: discuss with charge nurse/RN and provider, consider discussing with RRT  Notify: Charge Nurse/RN  Name of Charge Nurse/RN Notified Felicia Pardone  Date Charge Nurse/RN Notified 08/21/21  Time Charge Nurse/RN Notified 2235  Notify: Provider  Provider Name/Title Neomia Glass NP  Date Provider Notified 08/21/21  Time Provider Notified 2200  Notification Type Page  Notification Reason Change in status  Provider response See new orders;At bedside  Date of Provider Response 08/21/21  Time of Provider Response 2235 ([id number] NP Foust at bedside)  Assess: SIRS CRITERIA  SIRS Temperature  1  SIRS Pulse 1  SIRS Respirations  0  SIRS WBC 0  SIRS Score Sum  2

## 2021-08-21 NOTE — Progress Notes (Signed)
ANTICOAGULATION CONSULT NOTE  Pharmacy Consult for heparin infusion Indication: ACS/STEMI  Allergies  Allergen Reactions   Oxycodone-Acetaminophen Anaphylaxis    Swelling and rash   Celebrex [Celecoxib] Diarrhea   Codeine    Plerixafor     In 2016 during ASCT collection patient developed fever to 103.14F and required hospitalization   Benadryl [Diphenhydramine] Palpitations   Morphine Itching and Rash   Ondansetron Diarrhea   Tylenol [Acetaminophen] Itching and Rash    Patient Measurements: Height: _0  (157.5 cm) Weight: 70.6 kg (155 lb 10.3 oz) IBW/kg (Calculated) : 50.1 Heparin Dosing Weight: 65  Vital Signs: Temp: 98.3 F (36.8 C) (02/21 1148) Temp Source: Oral (02/21 1148) BP: 125/90 (02/21 1148) Pulse Rate: 85 (02/21 1148)  Labs: Recent Labs    08/18/21 1731 08/18/21 2110 08/20/21 0452 08/20/21 1652 08/21/21 0505 08/21/21 0725 08/21/21 0928 08/21/21 1230  HGB 12.1  --  9.7*  --  10.4*  --   --   --   HCT 35.9*  --  30.0*  --  31.8*  --   --   --   PLT 93*  --  68*  --  89*  --   --   --   APTT  --   --   --   --  51*  --   --   --   LABPROT 13.1  --   --   --  14.5  --   --   --   INR 1.0  --   --   --  1.1  --   --   --   HEPARINUNFRC  --   --   --   --  0.40  --   --  0.34  CREATININE 1.12*  --   --   --  1.08*  --   --   --   TROPONINIHS 7   < >  --    < > 2,239* 2,111* 2,143*  --    < > = values in this interval not displayed.     Estimated Creatinine Clearance: 48.4 mL/min (A) (by C-G formula based on SCr of 1.08 mg/dL (H)).   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   Due to Zometa   Cardiomyopathy, idiopathic ([place]) 08/21/2017   Overview:  Septal and apical hypokinesis with ef 40%   CHF  (congestive heart failure) (HCC)    CKD (chronic kidney disease) stage 3, GFR 30-59 ml/min (HCC)    Depression    Diabetes mellitus (HCC)    Dizziness    Fatty liver    Frequent falls    GERD (gastroesophageal reflux disease)    Gout    Heart murmur    History of blood transfusion    History of bone marrow transplant ([place])    History of uterine fibroid    Hx of cardiac catheterization 06/05/2016   Overview:  Normal coronaries 2017   Hypertension    Hypomagnesemia    IDA (iron deficiency anemia)    Multiple myeloma ([place])    Personal history of chemotherapy    Renal cyst    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[place]) 06/05/2016   Overview:  Sp ablation 1999    Medications:  Pt  ordered Enoxaparin 40 mg q24h for DVT proph, last dose 2/20 @ 0917  Assessment: Pt is 66 yo female with complex med hx currently on maintenance chemo, presenting to ED on 2/18 with SOB, now found with elevated troponin I lvl, trending up.  Baseline Plts = 93 at admission and now 68 prior to start of heparin infusion  Goal of Therapy:  Heparin level 0.3-0.7 units/ml Monitor platelets by anticoagulation protocol: Yes  2/21 0505 HL 0.40, therapeutic x 1, Plt 89 2/21 1230 HL 0.34, therapeutic x 2,    Plan:  Continue heparin infusion at 800 unit/hr Recheck HL in with AM labx. CBC daily while on heparin, will watch Plt closely.  Pearla Dubonnet, PharmD Clinical Pharmacist 08/21/2021 1:40 PM

## 2021-08-21 NOTE — Progress Notes (Addendum)
Pt states that shes taking 25 mg of promethazine at home every 6 hours as needed but on MAR its ordered 12.5 mg of promethazine every 6 hours as needed for nausea and vomiting. Pt notified staff that she was coughing up blood and infectious disease made aware by pt. At pt bedside table a small trace of blood in the tissue was present, NP Foust made aware. Will continue to monitor.  Update 2208: Pt temp at 103 HR at 113 rest of VSS. Pt also anxious and states taken ativan yesterday which helps her. NP Fosut made aware.   Update 1020: Pt was placed on non-rebreater 15 L oxygen. Respiratory was notified. NP Foust ordered Advil 400 mg tablet oral once. Will continue to monitor.  Update 2215: NP Foust ordered  Ativan 1 mg IV once and chest x-ray once. Pt just turn re mews. NP Foust made aware. Will continue to monitor.  Update 2243: NP Foust at bedside. Will continue to monitor.  Update 2308: HR at 127 NP Katy made aware. Will continue to monitor.

## 2021-08-21 NOTE — Consult Note (Cosign Needed)
[facility] NOTE       Patient ID: Sarah [name] MRN: [id number] DOB/AGE: 65-Apr-1957 65 y.o.  Admit date: 08/18/2021 Referring Physician Neomia Glass, NP Primary Physician  Primary Cardiologist Dr. Posey Boyer [facility] Card[facility]) Reason for Consultation elevated troponin  HPI: The patient is a 66 year old female with a past medical history notable for multiple myeloma s/p autologous stem cell transplant and currently on maintenance chemotherapy, chronic thrombocytopenia, bicuspid aortic valve with nonrheumatic severe AS, SVT s/p ablation in 1990s, PVCs, diabetes, chronic pain from spinal stenosis who presented to [facility] ED 08/18/2021 with a fever of 103, cough, sore throat, hoarseness x2 days was found to have sepsis likely 2/2 viral URI.  Cardiology is consulted because of her elevated troponin.  The patient has has a dry cough and fever that started last Friday 2/17. She has been on empiric antibiotics and IVF since admission for treatment of presumed viral URI. She states yesterday evening she was walking to the bathroom in her hospital room and suddenly had a squeezing sensation in her chest and associated SOB and diaphoresis that lasted 3-4 minutes and eventually terminated with rest. She had similar chest squeezing that lasted about 30 seconds the same evening. During the first episode her sister (at bedside) was on the phone with the patient and thought she sounded horrible with a wet raspy voice. She has a known bicuspid aortic valve & severe AS that is pending replacement with her regular cardiologist at [facility]. As baseline she has some exertional dyspnea that she thinks has been worsening lately. She feels somewhat better today, still with a dry cough with deep inspiration. Thinks her abdomen is distended. Denies palpitations, dizziness, LEE.   Vitals are notable for a blood pressure peaking overnight at 154/76, improved to 138/85 today.  Heart rate 91.  SPO2 99% on 2 L  by nasal cannula.  She is currently afebrile.  Labs are notable or a Cr of 1.08 and GFR of 57. BNP elevated to 1561. Troponin trended [id number].  TC 118, HDL 48, LDL 58, trig 61.  H&H 10.4/31.8.  Platelets around baseline at 89, improved from yesterday at 68.   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/2017   Due to Zometa   Cardiomyopathy, idiopathic ([place]) 08/21/2017   Overview:  Septal and apical hypokinesis with ef 40%   CHF (congestive heart failure) (HCC)    CKD (chronic kidney disease) stage 3, GFR 30-59 ml/min (HCC)    Depression    Diabetes mellitus (HCC)    Dizziness    Fatty liver    Frequent falls    GERD (gastroesophageal reflux disease)    Gout    Heart murmur    History of blood transfusion    History of bone marrow transplant ([place])    History of uterine fibroid    Hx of cardiac catheterization 06/05/2016   Overview:  Normal coronaries 2017   Hypertension    Hypomagnesemia    IDA (iron deficiency anemia)    Multiple myeloma ([place])    Personal history of chemotherapy    Renal cyst    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[place]) 06/05/2016   Overview:  Sp ablation 1999    Past Surgical History:  Procedure Laterality Date   ABDOMINAL HYSTERECTOMY     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   CARDIAC ELECTROPHYSIOLOGY MAPPING AND ABLATION     CARPAL TUNNEL RELEASE Bilateral    CHOLECYSTECTOMY  2008   COLONOSCOPY WITH PROPOFOL N/A 05/07/2017   Procedure: COLONOSCOPY WITH PROPOFOL;  Surgeon: [name], MD;  Location: [facility] ENDOSCOPY;  Service: Gastroenterology;  Laterality: N/A;   COLONOSCOPY WITH PROPOFOL N/A 12/19/2020   Procedure: COLONOSCOPY WITH PROPOFOL;   Surgeon: [name], MD;  Location: [facility] ENDOSCOPY;  Service: Gastroenterology;  Laterality: N/A;   ESOPHAGOGASTRODUODENOSCOPY (EGD) WITH PROPOFOL N/A 05/07/2017   Procedure: ESOPHAGOGASTRODUODENOSCOPY (EGD) WITH PROPOFOL;  Surgeon: [name], MD;  Location: [facility] ENDOSCOPY;  Service: Gastroenterology;  Laterality: N/A;   FOOT SURGERY Bilateral    INCONTINENCE SURGERY  2009   INTERSTIM IMPLANT PLACEMENT     other     over active bladder   OTHER SURGICAL HISTORY     bladder stimulator    PARTIAL HYSTERECTOMY  03/1996   fibroids   PORTA CATH INSERTION N/A 03/10/2019   Procedure: PORTA CATH INSERTION;  Surgeon: Algernon Huxley, MD;  Location: [facility] CV LAB;  Service: Cardiovascular;  Laterality: N/A;   TONSILLECTOMY  2007    Medications Prior to Admission  Medication Sig Dispense Refill Last Dose   acyclovir (ZOVIRAX) 400 MG tablet Take 1 tablet (400 mg total) by mouth 2 (two) times daily. 180 tablet 1 Past Week at UNKNOWN   albuterol (VENTOLIN HFA) 108 (90 Base) MCG/ACT inhaler Inhale 2 puffs into the lungs every 6 (six) hours as needed for wheezing or shortness of breath. 8 g 0 PRN at PRN   allopurinol (ZYLOPRIM) 100 MG tablet TAKE 1 TABLET(100 MG) BY MOUTH DAILY as needed 90 tablet 1 PRN at PRN   bisoprolol (ZEBETA) 5 MG tablet Take 5 mg by mouth daily.   08/17/2021 at 2200   diclofenac sodium (VOLTAREN) 1 % GEL Apply 2 g topically 4 (four) times daily. 100 g 1 PRN at PRN   FLUoxetine (PROZAC) 40 MG capsule TAKE 1 CAPSULE EVERY DAY 90 capsule 0 08/18/2021 at 0800   fluticasone (FLONASE) 50 MCG/ACT nasal spray Place 2 sprays into both nostrils daily. 16 g 2 08/17/2021 at 2200   montelukast (SINGULAIR) 10 MG tablet Take 1 tablet (10 mg total) by mouth See admin instructions. Take 1 tab daily for 2 days after daratumumab treatments 30 tablet 1 Past Week at UNKNOWN   Multiple Vitamin (MULTIVITAMIN) tablet Take 1 tablet by mouth daily.   08/18/2021 at 0800   omeprazole (PRILOSEC) 40 MG capsule  TAKE 1 CAPSULE (40 MG TOTAL) BY MOUTH IN THE MORNING AND AT BEDTIME. 180 capsule 1 08/18/2021 at 0800   promethazine (PHENERGAN) 25 MG tablet Take 1 tablet (25 mg total) by mouth every 6 (six) hours as needed for nausea or vomiting. 90 tablet 1 08/18/2021 at 0800   tiZANidine (ZANAFLEX) 2 MG tablet Take 1 tablet (2 mg total) by mouth every 6 (six) hours as needed for muscle spasms. 30 tablet 1 08/18/2021 at 0800   traZODone (DESYREL) 100 MG tablet TAKE 1 TABLET AT BEDTIME 90 tablet 0 08/17/2021   fluconazole (DIFLUCAN) 150 MG tablet Take 1 tablet (150 mg total) by mouth once as needed for up to 1 dose (vaginal yeast infection symptoms). 1 tablet 0 PRN at PRN   glucose blood test strip       sucralfate (CARAFATE)  1 g tablet Take 1 tablet (1 g total) by mouth 4 (four) times daily -  with meals and at bedtime. (Patient not taking: Reported on 08/01/2021) 120 tablet 2    Social History   Socioeconomic History   Marital status: Widowed    Spouse name: Not on file   Number of children: Not on file   Years of education: Not on file   Highest education level: Not on file  Occupational History   Occupation: Disabled  Tobacco Use   Smoking status: Former    Packs/day: 1.00    Years: 20.00    Pack years: 20.00    Types: Cigarettes    Quit date: 07/02/1991    Years since quitting: 30.1   Smokeless tobacco: Never  Vaping Use   Vaping Use: Never used  Substance and Sexual Activity   Alcohol use: Yes    Comment: occasionally   Drug use: No   Sexual activity: Never  Other Topics Concern   Not on file  Social History Narrative   Pt lives with her 2 sisters   Social Determinants of Health   Financial Resource Strain: Not on file  Food Insecurity: Not on file  Transportation Needs: Not on file  Physical Activity: Not on file  Stress: Not on file  Social Connections: Not on file  Intimate Partner Violence: Not on file    Family History  Problem Relation Age of Onset   Colon cancer Father     Renal Disease Father    Diabetes Mellitus II Father    Melanoma Paternal Grandmother    Breast cancer Maternal Aunt 62   Anemia Mother    Heart disease Mother    Heart failure Mother    Renal Disease Mother    Congestive Heart Failure Mother    Heart disease Maternal Uncle    Throat cancer Maternal Uncle    Lung cancer Maternal Uncle    Liver disease Maternal Uncle    Heart failure Maternal Uncle    Hearing loss Son 90       Suicide       Review of systems complete and found to be negative unless listed above    PHYSICAL EXAM General: Pleasant, ill appearing Caucasian female , well nourished, in no acute distress. Sitting upright in hospital bed with her sister at bedside.  HEENT:  Normocephalic and atraumatic. Neck:  No JVD.  Lungs: Some conversation dyspnea on 3L by [place]. Trace crackles in RLL. Wet cough with deep inspiration and wheezes anteriorly.  Heart: HRRR . Difficult to hear heart sounds over breath sounds and wheezing. Radial & DP pulses 2+ bilaterally. Abdomen: Soft, distended appearing.  Msk: Normal strength and tone for age. Extremities: Warm and well perfused. No clubbing, cyanosis. No LEE edema.  Neuro: Alert and oriented X 3. Psych:  Answers questions appropriately.   Labs:   Lab Results  Component Value Date   WBC 6.5 08/21/2021   HGB 10.4 (L) 08/21/2021   HCT 31.8 (L) 08/21/2021   MCV 82.4 08/21/2021   PLT 89 (L) 08/21/2021    Recent Labs  Lab 08/18/21 1731 08/21/21 0505  NA 130* 136  K 3.9 3.7  CL 99 103  CO2 20* 19*  BUN 23 18  CREATININE 1.12* 1.08*  CALCIUM 8.8* 8.0*  PROT 6.7  --   BILITOT 0.5  --   ALKPHOS 92  --   ALT 57*  --   AST 37  --   GLUCOSE  146* 255*   Lab Results  Component Value Date   TROPONINI <0.03 07/25/2017    Lab Results  Component Value Date   CHOL 118 08/21/2021   CHOL 177 12/05/2020   CHOL 186 11/30/2019   Lab Results  Component Value Date   HDL 48 08/21/2021   HDL 59 12/05/2020   HDL 71 11/30/2019    Lab Results  Component Value Date   LDLCALC 58 08/21/2021   LDLCALC 94 12/05/2020   LDLCALC 95 11/30/2019   Lab Results  Component Value Date   TRIG 61 08/21/2021   TRIG 140 12/05/2020   TRIG 114 11/30/2019   Lab Results  Component Value Date   CHOLHDL 2.5 08/21/2021   CHOLHDL 3.0 12/05/2020   CHOLHDL 2.6 11/30/2019   No results found for: LDLDIRECT    Radiology: DG Chest 1 View  Result Date: 08/19/2021 CLINICAL DATA:  Dyspnea. EXAM: CHEST  1 VIEW COMPARISON:  August 18, 2021 FINDINGS: There is stable right-sided venous Port-A-Cath positioning. The heart size and mediastinal contours are within normal limits. Both lungs are clear. Multilevel degenerative changes are seen throughout the thoracic spine. IMPRESSION: No active disease. Electronically Signed   By: Virgina Norfolk M.D.   On: 08/19/2021 21:19   DG Chest 2 View  Result Date: 08/18/2021 CLINICAL DATA:  Shortness of breath, cough, fever, suspected sepsis, multiple myeloma EXAM: CHEST - 2 VIEW COMPARISON:  08/17/2021 FINDINGS: The heart size and mediastinal contours are within normal limits. Both lungs are clear. Disc degenerative disease of the thoracic spine. IMPRESSION: No acute abnormality of the lungs. Electronically Signed   By: Delanna Ahmadi M.D.   On: 08/18/2021 18:13   DG Chest 2 View  Result Date: 08/17/2021 CLINICAL DATA:  Cough and low-grade fever. Multiple myeloma. Port in place. EXAM: CHEST - 2 VIEW COMPARISON:  06/23/2021 FINDINGS: Right chest wall porta catheter tip overlies the superior vena cava/right atrial junction. Cardiac silhouette and mediastinal contours are within normal limits. The lungs are clear. No pleural effusion or pneumothorax. Mild-to-moderate multilevel degenerative disc changes of the thoracic spine. Cholecystectomy clips. IMPRESSION: No active cardiopulmonary disease. Electronically Signed   By: Yvonne Kendall M.D.   On: 08/17/2021 13:56   CT Angio Chest PE W and/or Wo  Contrast  Result Date: 08/18/2021 CLINICAL DATA:  Pulmonary embolism suspected, high probability. Shortness of breath and fever beginning 3 days ago. EXAM: CT ANGIOGRAPHY CHEST WITH CONTRAST TECHNIQUE: Multidetector CT imaging of the chest was performed using the standard protocol during bolus administration of intravenous contrast. Multiplanar CT image reconstructions and MIPs were obtained to evaluate the vascular anatomy. RADIATION DOSE REDUCTION: This exam was performed according to the departmental dose-optimization program which includes automated exposure control, adjustment of the mA and/or kV according to patient size and/or use of iterative reconstruction technique. CONTRAST:  62m OMNIPAQUE IOHEXOL 350 MG/ML SOLN COMPARISON:  Chest radiography same day. Prior CT angiography 03/17/2020 FINDINGS: Cardiovascular: The heart is enlarged. No pericardial fluid. No visible coronary artery calcification or aortic atherosclerotic calcification. Pulmonary arterial opacification is moderate. No pulmonary emboli are seen. Mediastinum/Nodes: Chronic calcified mediastinal lymph nodes as noted previously. Lungs/Pleura: No pulmonary infiltrate, collapse or effusion. Patient does have some scattered areas of bronchial thickening. No pleural effusion. Upper Abdomen: Fatty change of the liver. No focal lesion is visible. Previous cholecystectomy. Musculoskeletal: Negative Review of the MIP images confirms the above findings. IMPRESSION: No pulmonary emboli. Mild cardiomegaly as seen previously. Possible bronchitis pattern. No pulmonary consolidation or collapse. No  pleural effusion. Chronic calcified mediastinal lymph nodes. Electronically Signed   By: Nelson Chimes M.D.   On: 08/18/2021 20:08   US Abdomen Complete  Result Date: 07/26/2021 CLINICAL DATA:  66 year old female with abnormal LFTs. Prior cholecystectomy. EXAM: ABDOMEN ULTRASOUND COMPLETE COMPARISON:  CT Abdomen and Pelvis 03/05/2021, 10/16/2016. Renal  ultrasound 01/24/2020. FINDINGS: Gallbladder: Surgically absent. Common bile duct: Diameter: 5 mm, normal. Liver: Echogenic liver (image 16). No discrete liver lesion. No intrahepatic biliary ductal dilatation. Portal vein is patent on color Doppler imaging with normal direction of blood flow towards the liver. IVC: No abnormality visualized. Pancreas: Visualized portion unremarkable. Spleen: Size and appearance within normal limits. Right Kidney: Length: 10.2 cm. Echogenicity within normal limits. No mass or hydronephrosis visualized. Left Kidney: Length: 9.3 cm. Small complex echogenic midpole lesion (image 60). Was not apparent on the 2021 ultrasound, but probably corresponds to a small 12 mm hypodense heterogeneous area on the CT last year, which was present in 2018. This is 11 x 15 x 17 mm by ultrasound. There is some comet tail artifact in this area by ultrasound now (image 64). No other left renal mass. No hydronephrosis. Preserved left renal cortical echogenicity. Abdominal aorta: No aneurysm visualized. Other findings: None. IMPRESSION: 1. Evidence of hepatic steatosis. Absent gallbladder no evidence of bile duct obstruction. 2. Small up to 1.7 cm complex, mixed echogenic lesion of the left kidney. This might reflect dystrophic calcification of a chronic cyst seen previously by CT, uncertain. But it was not apparent by prior renal ultrasound, most recently in 2021. Recommend dedicated Renal Mass Protocol Abdomen MRI or CT (without and with contrast) to best characterize. Electronically Signed   By: Genevie Ann M.D.   On: 07/26/2021 11:23   DG Chest Port 1 View  Result Date: 08/20/2021 CLINICAL DATA:  Shortness of breath, cough EXAM: PORTABLE CHEST 1 VIEW COMPARISON:  Previous studies including the examination of 08/19/2021 FINDINGS: Transverse diameter of heart is within normal limits. Tip of right IJ chest port is seen in the superior vena cava close to the right atrium. Central pulmonary vessels are more  prominent. There is interval increase in interstitial markings in both parahilar regions and both lower lung fields. There is no focal consolidation. There is minimal blunting of lateral CP angles. There is no pneumothorax. IMPRESSION: Central pulmonary vessels are more prominent suggesting CHF. Increased interstitial markings are seen in both lungs suggesting interstitial pulmonary edema. Less likely possibility would be interstitial pneumonia. Minimal bilateral pleural effusions. Electronically Signed   By: Elmer Picker M.D.   On: 08/20/2021 17:59    ECHO 01/24/2021 Summary    1. The left ventricle is normal in size with normal wall thickness.    2. The left ventricular systolic function is normal, LVEF is visually  estimated at > 55%.    3. Moderate to severe paradoxical low-flow low-gradient aortic stenosis.    4. The right ventricle is normal in size, with normal systolic function.   EKG reviewed by me and Dr. Clayborn Bigness: sinus tach, rate 102, LBBB, PVCs. LBBB new compared to EKG in 05/2021   Cardiac Cath 02/21/2016  Coronary angiography:  1. Left main trunk is normal, divides into LAD and circumflex arteries.  Left main is short.  2. LAD starts as a larger caliber vessel, becomes small as it travels down  to the apex from the mid to distal segment. No focal stenosis is  identified in the LAD. Diagonal branches are free of disease.  3. Circumflex and  its branches is angiographically normal.  4. Right coronary artery and its branches is angiographically normal.   CONCLUSION(S):  1. Angiographically unremarkable epicardial coronaries.  2. Preserved LV systolic function by echocardiogram.  3. Small gradient across the aortic valve.  4. False positive Cardiolite stress testing possibly related to a small  distal LAD.   ASSESSMENT AND PLAN:  The patient is a 66 year old female with a past medical history notable for multiple myeloma s/p autologous stem cell transplant and currently  on maintenance chemotherapy, chronic thrombocytopenia, bicuspid aortic valve with nonrheumatic severe AS, SVT s/p ablation in 1990s, PVCs, diabetes, chronic pain from spinal stenosis who presented to [facility] ED 08/18/2021 with a fever of 103, cough, sore throat, hoarseness x2 days was found to have sepsis likely 2/2 viral URI.  Cardiology is consulted because of her elevated troponin.  #NSTEMI #new LBBB #Sepsis likely 2/2 viral URI The patient admits to a 3-4 minute episode of chest squeezing and associated dyspnea and diaphoresis that occurred while she was walking form the bathroom back to her hospital bed the evening of 2/20. She has some exertional dyspnea and weakness at baseline thought to be due to her known severe AS, however this chest squeezing and associated symptoms are new and with her peak troponin of 2239 it's concerning the patient has underlying coronary disease.  -Agree with current therapy for sepsis  -S/p aspirin 324 mg, continue 81 mg aspirin daily -continue heparin drip for 72 hours per Dr. Clayborn Bigness (end 0100 on 2/24)  -Continue low-dose metoprolol tartrate 12.5 twice daily -PRN SL nitro -continue monitoring on telemetry while inpatient -Agree with echocardiogram complete, will follow results -Further ischemic workup is needed but will be based on her clinical course either during this hospitalization or on an outpatient basis per Dr. Clayborn Bigness.   #Multiple myeloma s/p stem cell transplant, on maintenance chemo #Chronic thrombocytopenia Platelets around patient's baseline 89 today, improved from 68. We will continue close monitoring of her platelets.   #Bicuspid aortic valve with nonrheumatic severe AS - seen on echo 12/2020 - will need outpatient follow up with her regular cardiology team at [facility].  This patient's plan of care was discussed and created with Dr. Lujean Amel and he is in agreement.  Signed: Tristan Schroeder , PA-C 08/21/2021, 10:16 AM [facility]  Cardiology

## 2021-08-21 NOTE — Assessment & Plan Note (Signed)
S/p autologous bone marrow transplant. Oncology on board and they were holding maintenance injections of daratumumab due to acute infection. -Continue to monitor

## 2021-08-21 NOTE — Progress Notes (Signed)
ANTICOAGULATION CONSULT NOTE  Pharmacy Consult for heparin infusion Indication: ACS/STEMI  Allergies  Allergen Reactions   Oxycodone-Acetaminophen Anaphylaxis    Swelling and rash   Celebrex [Celecoxib] Diarrhea   Codeine    Plerixafor     In 2016 during ASCT collection patient developed fever to 103.72F and required hospitalization   Benadryl [Diphenhydramine] Palpitations   Morphine Itching and Rash   Ondansetron Diarrhea   Tylenol [Acetaminophen] Itching and Rash    Patient Measurements: Height: _0  (157.5 cm) Weight: 70.6 kg (155 lb 10.3 oz) IBW/kg (Calculated) : 50.1 Heparin Dosing Weight: 65  Vital Signs: Temp: 98 F (36.7 C) (02/21 0523) BP: 138/85 (02/21 0523) Pulse Rate: 91 (02/21 0523)  Labs: Recent Labs    08/18/21 1731 08/18/21 2110 08/20/21 0452 08/20/21 1652 08/20/21 2227 08/21/21 0036 08/21/21 0505  HGB 12.1  --  9.7*  --   --   --  10.4*  HCT 35.9*  --  30.0*  --   --   --  31.8*  PLT 93*  --  68*  --   --   --  89*  APTT  --   --   --   --   --   --  51*  LABPROT 13.1  --   --   --   --   --  14.5  INR 1.0  --   --   --   --   --  1.1  HEPARINUNFRC  --   --   --   --   --   --  0.40  CREATININE 1.12*  --   --   --   --   --  1.08*  TROPONINIHS 7   < >  --    < > 1,156* 1,569* 2,239*   < > = values in this interval not displayed.     Estimated Creatinine Clearance: 48.4 mL/min (A) (by C-G formula based on SCr of 1.08 mg/dL (H)).   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   Due to Zometa   Cardiomyopathy, idiopathic ([place]) 08/21/2017   Overview:  Septal and apical hypokinesis with ef 40%   CHF (congestive heart failure) (HCC)    CKD (chronic kidney disease) stage 3, GFR 30-59 ml/min (HCC)     Depression    Diabetes mellitus (HCC)    Dizziness    Fatty liver    Frequent falls    GERD (gastroesophageal reflux disease)    Gout    Heart murmur    History of blood transfusion    History of bone marrow transplant ([place])    History of uterine fibroid    Hx of cardiac catheterization 06/05/2016   Overview:  Normal coronaries 2017   Hypertension    Hypomagnesemia    IDA (iron deficiency anemia)    Multiple myeloma (HCC)    Personal history of chemotherapy    Renal cyst    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  Baseline Plts = 93 at admission and now 68 prior to start of heparin infusion  Goal of Therapy:  Heparin level 0.3-0.7 units/ml Monitor platelets by anticoagulation protocol: Yes  2/21 0505 HL 0.40, therapeutic x 1, Plt 89   Plan:  Continue heparin infusion at 800 unit/hr Recheck HL in 6 hrs to confirm. CBC daily while on heparin, will watch Plt closely.  Renda Rolls, PharmD, [facility] 08/21/2021 6:50 AM

## 2021-08-22 ENCOUNTER — Inpatient Hospital Stay: Payer: Medicare HMO

## 2021-08-22 ENCOUNTER — Telehealth: Payer: Self-pay | Admitting: *Deleted

## 2021-08-22 DIAGNOSIS — A419 Sepsis, unspecified organism: Secondary | ICD-10-CM | POA: Diagnosis not present

## 2021-08-22 DIAGNOSIS — I5021 Acute systolic (congestive) heart failure: Secondary | ICD-10-CM | POA: Diagnosis present

## 2021-08-22 DIAGNOSIS — I5022 Chronic systolic (congestive) heart failure: Secondary | ICD-10-CM | POA: Diagnosis present

## 2021-08-22 DIAGNOSIS — I214 Non-ST elevation (NSTEMI) myocardial infarction: Secondary | ICD-10-CM | POA: Diagnosis not present

## 2021-08-22 DIAGNOSIS — J40 Bronchitis, not specified as acute or chronic: Secondary | ICD-10-CM | POA: Diagnosis not present

## 2021-08-22 LAB — CBC
HCT: 30.9 % — ABNORMAL LOW (ref 36.0–46.0)
Hemoglobin: 10.4 g/dL — ABNORMAL LOW (ref 12.0–15.0)
MCH: 27 pg (ref 26.0–34.0)
MCHC: 33.7 g/dL (ref 30.0–36.0)
MCV: 80.3 fL (ref 80.0–100.0)
Platelets: 102 10*3/uL — ABNORMAL LOW (ref 150–400)
RBC: 3.85 MIL/uL — ABNORMAL LOW (ref 3.87–5.11)
RDW: 14.9 % (ref 11.5–15.5)
WBC: 9.1 10*3/uL (ref 4.0–10.5)
nRBC: 0 % (ref 0.0–0.2)

## 2021-08-22 LAB — HEPARIN LEVEL (UNFRACTIONATED)
Heparin Unfractionated: 0.29 [IU]/mL — ABNORMAL LOW (ref 0.30–0.70)
Heparin Unfractionated: >1.1 IU/mL — ABNORMAL HIGH (ref 0.30–0.70)
Heparin Unfractionated: >1.1 IU/mL — ABNORMAL HIGH (ref 0.30–0.70)

## 2021-08-22 LAB — TROPONIN I (HIGH SENSITIVITY)
Troponin I (High Sensitivity): 1176 ng/L (ref <18)
Troponin I (High Sensitivity): 1194 ng/L (ref <18)

## 2021-08-22 LAB — BASIC METABOLIC PANEL
Anion gap: 11 (ref 5–15)
BUN: 32 mg/dL — ABNORMAL HIGH (ref 8–23)
CO2: 23 mmol/L (ref 22–32)
Calcium: 8.1 mg/dL — ABNORMAL LOW (ref 8.9–10.3)
Chloride: 100 mmol/L (ref 98–111)
Creatinine, Ser: 1.29 mg/dL — ABNORMAL HIGH (ref 0.44–1.00)
GFR, Estimated: 46 mL/min — ABNORMAL LOW (ref >60)
Glucose, Bld: 151 mg/dL — ABNORMAL HIGH (ref 70–99)
Potassium: 3.6 mmol/L (ref 3.5–5.1)
Sodium: 134 mmol/L — ABNORMAL LOW (ref 135–145)

## 2021-08-22 LAB — MAGNESIUM: Magnesium: 1.1 mg/dL — ABNORMAL LOW (ref 1.7–2.4)

## 2021-08-22 LAB — GLUCOSE, CAPILLARY
Glucose-Capillary: 150 mg/dL — ABNORMAL HIGH (ref 70–99)
Glucose-Capillary: 137 mg/dL — ABNORMAL HIGH (ref 70–99)
Glucose-Capillary: 188 mg/dL — ABNORMAL HIGH (ref 70–99)
Glucose-Capillary: 116 mg/dL — ABNORMAL HIGH (ref 70–99)
Glucose-Capillary: 149 mg/dL — ABNORMAL HIGH (ref 70–99)

## 2021-08-22 LAB — HEMOGLOBIN A1C
Hgb A1c MFr Bld: 7.5 % — ABNORMAL HIGH (ref 4.8–5.6)
Mean Plasma Glucose: 169 mg/dL

## 2021-08-22 LAB — PROCALCITONIN: Procalcitonin: 0.47 ng/mL

## 2021-08-22 MED ORDER — FLUTICASONE PROPIONATE 50 MCG/ACT NA SUSP
1.0000 | Freq: Every day | NASAL | Status: DC
  Administered 2021-08-22 – 2021-08-25 (×4): 1 via NASAL
  Filled 2021-08-22: qty 16

## 2021-08-22 MED ORDER — GABAPENTIN 600 MG PO TABS
300.0000 mg | ORAL_TABLET | Freq: Once | ORAL | Status: AC
  Administered 2021-08-22: 300 mg via ORAL
  Filled 2021-08-22: qty 1

## 2021-08-22 MED ORDER — HEPARIN BOLUS VIA INFUSION
2000.0000 [IU] | Freq: Once | INTRAVENOUS | Status: AC
  Administered 2021-08-22: 2000 [IU] via INTRAVENOUS
  Filled 2021-08-22: qty 2000

## 2021-08-22 MED ORDER — IBUPROFEN 400 MG PO TABS
400.0000 mg | ORAL_TABLET | Freq: Four times a day (QID) | ORAL | Status: DC | PRN
  Administered 2021-08-22: 400 mg via ORAL
  Filled 2021-08-22: qty 1

## 2021-08-22 MED ORDER — METOPROLOL TARTRATE 25 MG PO TABS
12.5000 mg | ORAL_TABLET | Freq: Two times a day (BID) | ORAL | Status: DC
Start: 2021-08-22 — End: 2021-08-25
  Administered 2021-08-22 – 2021-08-25 (×6): 12.5 mg via ORAL
  Filled 2021-08-22 (×6): qty 1

## 2021-08-22 MED ORDER — MAGNESIUM SULFATE 4 GM/100ML IV SOLN
4.0000 g | Freq: Once | INTRAVENOUS | Status: AC
  Administered 2021-08-22: 4 g via INTRAVENOUS
  Filled 2021-08-22: qty 100

## 2021-08-22 MED ORDER — CEFTRIAXONE SODIUM 2 G IJ SOLR
2.0000 g | INTRAMUSCULAR | Status: DC
  Administered 2021-08-22 – 2021-08-25 (×4): 2 g via INTRAVENOUS
  Filled 2021-08-22: qty 2
  Filled 2021-08-22 (×4): qty 20

## 2021-08-22 MED ORDER — BISOPROLOL FUMARATE 5 MG PO TABS: 5.0000 mg | ORAL_TABLET | Freq: Every day | ORAL | Status: DC

## 2021-08-22 MED ORDER — HEPARIN (PORCINE) 25000 UT/250ML-% IV SOLN
650.0000 [IU]/h | INTRAVENOUS | Status: DC
  Administered 2021-08-22: 650 [IU]/h via INTRAVENOUS

## 2021-08-22 MED ORDER — HEPARIN (PORCINE) 25000 UT/250ML-% IV SOLN
800.0000 [IU]/h | INTRAVENOUS | Status: DC
  Administered 2021-08-22: 800 [IU]/h via INTRAVENOUS
  Filled 2021-08-22: qty 250

## 2021-08-22 MED ORDER — AZITHROMYCIN 250 MG PO TABS
250.0000 mg | ORAL_TABLET | Freq: Every day | ORAL | Status: DC
  Administered 2021-08-23 – 2021-08-25 (×3): 250 mg via ORAL
  Filled 2021-08-22 (×3): qty 1

## 2021-08-22 MED ORDER — AZITHROMYCIN 250 MG PO TABS
500.0000 mg | ORAL_TABLET | Freq: Every day | ORAL | Status: AC
  Administered 2021-08-22: 500 mg via ORAL
  Filled 2021-08-22: qty 2

## 2021-08-22 NOTE — Assessment & Plan Note (Signed)
Met sepsis criteria with fever, tachycardia, tachypnea, leukocytosis and respiratory viral panel positive for metapneumovirus.  He received IV antibiotics which were later discontinued as initial procalcitonin was negative. Overnight became febrile again with worsening shortness of breath.  Repeat chest x-ray with bilateral infiltrates/pulmonary edema.  Procalcitonin at 0.44 today. -Start her on ceftriaxone and Zithromax for concern of superadded bacterial pneumonia. -Currently on acyclovir

## 2021-08-22 NOTE — Telephone Encounter (Signed)
Per Dr. Tasia Catchings, we will wait until discharge to r/s appts

## 2021-08-22 NOTE — Assessment & Plan Note (Signed)
Received a dose of IVIG

## 2021-08-22 NOTE — Progress Notes (Addendum)
ANTICOAGULATION CONSULT NOTE  Pharmacy Consult for heparin infusion Indication: ACS/STEMI  Allergies  Allergen Reactions   Oxycodone-Acetaminophen Anaphylaxis    Swelling and rash   Celebrex [Celecoxib] Diarrhea   Codeine    Plerixafor     In 2016 during ASCT collection patient developed fever to 103.34F and required hospitalization   Benadryl [Diphenhydramine] Palpitations   Morphine Itching and Rash   Ondansetron Diarrhea   Tylenol [Acetaminophen] Itching and Rash    Patient Measurements: Height: _0  (157.5 cm) Weight: 70.6 kg (155 lb 10.3 oz) IBW/kg (Calculated) : 50.1 Heparin Dosing Weight: 65  Vital Signs: Temp: 99.3 F (37.4 C) (02/22 0528) Temp Source: Oral (02/22 0528) BP: 114/94 (02/22 0528) Pulse Rate: 97 (02/22 0215)  Labs: Recent Labs    08/20/21 0452 08/20/21 1652 08/21/21 0505 08/21/21 0725 08/21/21 0928 08/21/21 1230 08/22/21 0533  HGB 9.7*  --  10.4*  --   --   --  10.4*  HCT 30.0*  --  31.8*  --   --   --  30.9*  PLT 68*  --  89*  --   --   --  102*  APTT  --   --  51*  --   --   --   --   LABPROT  --   --  14.5  --   --   --   --   INR  --   --  1.1  --   --   --   --   HEPARINUNFRC  --   --  0.40  --   --  0.34 0.29*  CREATININE  --   --  1.08*  --   --   --  1.29*  TROPONINIHS  --    < > 2,239* 2,111* 2,143*  --   --    < > = values in this interval not displayed.     Estimated Creatinine Clearance: 40.5 mL/min (A) (by C-G formula based on SCr of 1.29 mg/dL (H)).   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   Due to Zometa   Cardiomyopathy, idiopathic ([name]) 08/21/2017   Overview:  Septal and apical hypokinesis with ef 40%   CHF (congestive heart failure) (HCC)    CKD (chronic kidney  disease) stage 3, GFR 30-59 ml/min (HCC)    Depression    Diabetes mellitus (HCC)    Dizziness    Fatty liver    Frequent falls    GERD (gastroesophageal reflux disease)    Gout    Heart murmur    History of blood transfusion    History of bone marrow transplant ([place])    History of uterine fibroid    Hx of cardiac catheterization 06/05/2016   Overview:  Normal coronaries 2017   Hypertension    Hypomagnesemia    IDA (iron deficiency anemia)    Multiple myeloma (HCC)    Personal history of chemotherapy    Renal cyst    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 Presented to ED on 2/18 with SOB. Then, on 2/20, found to have elevated troponins (pk 2,143). EKG showed LBBB, ECHO with depressed EF with regional wall motion abnormalities consistent with NSTEMI.  CBC stable. Hgb 9.7 > 10.4. Plt improved from 68>102.   2/22 update: Heparin discontinued overnight. Per RN, patient coughing overnight with bloody sputum, however actual content of blood in sputum was minimal. Plan is to continue heparin infusion for 72 hours.  Goal of Therapy:  Heparin level 0.3-0.7 units/ml Monitor platelets by anticoagulation protocol: Yes  2/21 0505 HL 0.40, therapeutic x 1, Plt 89 2/21 1230 HL 0.34, therapeutic x 2,  2/22 0200 --heparin discontinued--   Plan: Restart heparin  Heparin bolus 2,000 units x 1 (30 units/kg) Restart heparin infusion at 800 unit/hr 6 hour heparin level Daily CBC, HL. Monitor for any new or worsening bleeding.  Wynelle Cleveland, PharmD Pharmacy Resident  08/22/2021 8:01 AM

## 2021-08-22 NOTE — Telephone Encounter (Signed)
Patient called to cancel next two appointments she is in hospital and has spoken to Dr. Janese Banks.

## 2021-08-22 NOTE — Progress Notes (Signed)
Progress Note   Patient: Sarah [name] VQX:[id number] DOB: 11/27/1955 DOA: 08/18/2021     4 DOS: the patient was seen and examined on 08/22/2021   Brief hospital course: Taken from prior notes.  Sarah [name] is a 66 y.o. 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 faecalis UTI, after presenting with fever, and again in December 2022 with URI ruled out for sepsis.  They presented from home to the ED on 08/18/2021 with fever of 103, cough, sore throat, hoarseness x 2 days.   Patient met sepsis criteria with fever of 102.9, tachycardia, tachypnea, most likely secondary to viral cause as respiratory viral panel positive for metapneumovirus.  Overnight developed chest pain with shortness of breath, elevated troponin which peaked at 2239. EKG with new LBBB, echocardiogram with new onset reduced EF of 30 to 35% and regional wall motion abnormalities, consistent with NSTEMI.  Cardiology was consulted and she was placed on heparin infusion, pending further ischemic evaluation.  2/22: Patient became more short of breath and febrile overnight.  She also developed more chest pain and at times becoming confused persisted.  Able to answer questions appropriately and started today.  Troponin trending down, procalcitonin started going up at 0.4 for now. Chest x-ray with some bilateral infiltrate, received a dose of IV Lasix for concern of pulmonary edema-mild worsening of renal function. Started her on CAP coverage, for concern of superadded bacterial infection.   Assessment and Plan: * Sepsis ([place])- (present on admission) Met sepsis criteria with fever, tachycardia, tachypnea, leukocytosis and  respiratory viral panel positive for metapneumovirus.  He received IV antibiotics which were later discontinued as initial procalcitonin was negative. Overnight became febrile again with worsening shortness of breath.  Repeat chest x-ray with bilateral infiltrates/pulmonary edema.  Procalcitonin at 0.44 today. -Start her on ceftriaxone and Zithromax for concern of superadded bacterial pneumonia. -Currently on acyclovir  NSTEMI (non-ST elevated myocardial infarction) [facility]) Patient developed chest pain with shortness of breath.  Elevated troponin which peaked at 2239.  EKG with new LBBB and echocardiogram with newly depressed EF with regional wall motion abnormalities consistent with NSTEMI. Cardiology was consulted and she was started on heparin infusion. -Continue heparin infusion -Continue with metoprolol -Continue with aspirin and statin -Patient will need further ischemic work-up-I will defer that decision to cardiology  Acute HFrEF (heart failure with reduced ejection fraction) ([place]) Most likely secondary to NSTEMI, recent echo with EF of 35 to 40% with regional wall motion abnormalities.  There was some concern of pulmonary edema when chest x-ray was repeated overnight due to worsening shortness of breath.  Received 1 dose of IV Lasix, mild worsening of renal function today. Clinically appears euvolemic. -Holding diuretic at this time. -Strict intake and output -Daily weight and BMP  Multiple myeloma not having achieved remission ([place])- (present on admission) S/p autologous bone marrow transplant. Oncology on board and they were holding maintenance injections of daratumumab due to acute infection. -Continue to monitor  Thrombocytopenia ([place])- (present on admission) Acute on chronic thrombocytopenia, likely due to acute infection.  Improving, platelet at 102 today -Continue to monitor -Oncology on board  Type II diabetes mellitus with complication ([place])- (present on admission) CBG  elevated. -Continue with SSI -Add Semglee 5 units twice daily -Add 3  units for mealtime coverage -Continue to monitor  Severe aortic stenosis Echocardiogram with bicuspid aortic valve. Being managed by [facility] cardiology.  Spinal stenosis of lumbar region with neurogenic claudication- (present on admission) Recently received transforaminal epidural steroid injection as outpatient. -Continue with pain management -Continue with Lyrica  Low immunoglobulin level Received a dose of IVIG   Subjective: Patient continues to have more cough and some shortness of breath.  Developed fever and worsening shortness of breath overnight.  Also endorsed some chest pain which seems more pleuritic as it increases with deep breathing.  Sister was concerned of developing mild transient confusion.  Able to answer questions appropriately.  Physical Exam: Vitals:   08/22/21 0528 08/22/21 0820 08/22/21 0821 08/22/21 1220  BP: (!) 114/94  130/86 130/89  Pulse:   (!) 107 (!) 54  Resp: 20  18 18   Temp: 99.3 F (37.4 C)  97.7 F (36.5 C) 98.7 F (37.1 C)  TempSrc: Oral     SpO2: 98% 92% 100% 97%  Weight:      Height:       General.  Well-developed lady, in no acute distress. Pulmonary.  Few scattered rhonchi bilaterally, normal respiratory effort. CV.  Regular rate and rhythm, no JVD, rub or murmur. Abdomen.  Soft, nontender, nondistended, BS positive. CNS.  Alert and oriented x3.  No focal neurologic deficit. Extremities.  No edema, no cyanosis, pulses intact and symmetrical. Psychiatry.  Judgment and insight appears normal.  Data Reviewed: Reviewed prior notes, images and labs  Family Communication: Discussed with sister at bedside  Disposition: Status is: Inpatient Remains inpatient appropriate because: Severity of illness  Planned Discharge Destination: Home with Home Health  DVT prophylaxis.  Heparin infusion  Time spent: 50 minutes  Author: Lorella Nimrod, MD 08/22/2021 12:43  PM  This record has been created using Dragon voice recognition software. Errors have been sought and corrected,but may not always be located. Such creation errors do not reflect on the standard of care.  For on call review [email or web address].

## 2021-08-22 NOTE — Assessment & Plan Note (Signed)
Most likely secondary to NSTEMI, recent echo with EF of 35 to 40% with regional wall motion abnormalities.  There was some concern of pulmonary edema when chest x-ray was repeated overnight due to worsening shortness of breath.  Received 1 dose of IV Lasix, mild worsening of renal function today. Clinically appears euvolemic. -Holding diuretic at this time. -Strict intake and output -Daily weight and BMP

## 2021-08-22 NOTE — Progress Notes (Signed)
[place] NOTE       Patient ID: Sarah [name] MRN: [id number] DOB/AGE: [birth year] 65 y.o.  Admit date: 08/18/2021 Referring Physician Neomia Glass, NP Primary Physician  Primary Cardiologist Dr. Posey [facility]) Reason for Consultation elevated troponin  HPI: The patient is a 66 year old female with a past medical history notable for multiple myeloma s/p autologous stem cell transplant and currently on maintenance chemotherapy, chronic thrombocytopenia, bicuspid aortic valve with nonrheumatic severe AS, SVT s/p ablation in 1990s, PVCs, diabetes, chronic pain from spinal stenosis who presented to [facility] ED 08/18/2021 with a fever of 103, cough, sore throat, hoarseness x2 days was found to have sepsis likely 2/2 viral URI.  Cardiology is consulted because of her elevated troponin which peaked at 2,239. She has a newly depressed EF of 35-40% with LV hypokinesis.   Interval history: -Febrile overnight with worsening shortness of breath, some episodes of blood-tinged sputum.  Started on antibiotics by primary team.  Denies further chest pain. -She has been in ventricular bigeminy with rate mostly in the 100s, peaking in the 130s throughout the day.  - Echo resulted with depressed EF 35-40% and ant/apical/septal/high lateral hypokinesis, mild MR and severe AS with known bicuspid AV.    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   Due to Zometa   Cardiomyopathy, idiopathic ([place]) 08/21/2017   Overview:  Septal and apical hypokinesis with ef 40%   CHF (congestive heart failure) (HCC)    CKD (chronic kidney disease) stage 3, GFR 30-59 ml/min (HCC)     Depression    Diabetes mellitus (HCC)    Dizziness    Fatty liver    Frequent falls    GERD (gastroesophageal reflux disease)    Gout    Heart murmur    History of blood transfusion    History of bone marrow transplant ([place])    History of uterine fibroid    Hx of cardiac catheterization 06/05/2016   Overview:  Normal coronaries 2017   Hypertension    Hypomagnesemia    IDA (iron deficiency anemia)    Multiple myeloma ([place])    Personal history of chemotherapy    Renal cyst    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[place]) 06/05/2016   Overview:  Sp ablation 1999    Past Surgical History:  Procedure Laterality Date   ABDOMINAL HYSTERECTOMY     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   CARDIAC ELECTROPHYSIOLOGY MAPPING AND ABLATION     CARPAL TUNNEL RELEASE Bilateral    CHOLECYSTECTOMY  2008   COLONOSCOPY WITH PROPOFOL N/A 05/07/2017   Procedure: COLONOSCOPY WITH PROPOFOL;  Surgeon: [name], MD;  Location[facility] ENDOSCOPY;  Service: Gastroenterology;  Laterality: N/A;   COLONOSCOPY WITH PROPOFOL N/A 12/19/2020   Procedure: COLONOSCOPY WITH PROPOFOL;  Surgeon: [name], MD;  Location: [facility] ENDOSCOPY;  Service: Gastroenterology;  Laterality: N/A;   ESOPHAGOGASTRODUODENOSCOPY (EGD) WITH PROPOFOL N/A 05/07/2017   Procedure: ESOPHAGOGASTRODUODENOSCOPY (EGD) WITH PROPOFOL;  Surgeon: [name], MD;  Location: [facility] ENDOSCOPY;  Service: Gastroenterology;  Laterality: N/A;   FOOT SURGERY Bilateral    INCONTINENCE SURGERY  2009   INTERSTIM IMPLANT PLACEMENT     other  over active bladder   OTHER SURGICAL HISTORY     bladder stimulator    PARTIAL HYSTERECTOMY  03/1996   fibroids   PORTA CATH INSERTION N/A 03/10/2019   Procedure: PORTA CATH INSERTION;  Surgeon: Algernon Huxley, MD;  Location: [facility] LAB;  Service: Cardiovascular;  Laterality: N/A;   TONSILLECTOMY  2007    Medications Prior to Admission  Medication Sig Dispense Refill Last Dose   acyclovir (ZOVIRAX) 400 MG tablet Take 1 tablet  (400 mg total) by mouth 2 (two) times daily. 180 tablet 1 Past Week at UNKNOWN   albuterol (VENTOLIN HFA) 108 (90 Base) MCG/ACT inhaler Inhale 2 puffs into the lungs every 6 (six) hours as needed for wheezing or shortness of breath. 8 g 0 PRN at PRN   allopurinol (ZYLOPRIM) 100 MG tablet TAKE 1 TABLET(100 MG) BY MOUTH DAILY as needed 90 tablet 1 PRN at PRN   bisoprolol (ZEBETA) 5 MG tablet Take 5 mg by mouth daily.   08/17/2021 at 2200   diclofenac sodium (VOLTAREN) 1 % GEL Apply 2 g topically 4 (four) times daily. 100 g 1 PRN at PRN   FLUoxetine (PROZAC) 40 MG capsule TAKE 1 CAPSULE EVERY DAY 90 capsule 0 08/18/2021 at 0800   fluticasone (FLONASE) 50 MCG/ACT nasal spray Place 2 sprays into both nostrils daily. 16 g 2 08/17/2021 at 2200   montelukast (SINGULAIR) 10 MG tablet Take 1 tablet (10 mg total) by mouth See admin instructions. Take 1 tab daily for 2 days after daratumumab treatments 30 tablet 1 Past Week at UNKNOWN   Multiple Vitamin (MULTIVITAMIN) tablet Take 1 tablet by mouth daily.   08/18/2021 at 0800   omeprazole (PRILOSEC) 40 MG capsule TAKE 1 CAPSULE (40 MG TOTAL) BY MOUTH IN THE MORNING AND AT BEDTIME. 180 capsule 1 08/18/2021 at 0800   promethazine (PHENERGAN) 25 MG tablet Take 1 tablet (25 mg total) by mouth every 6 (six) hours as needed for nausea or vomiting. 90 tablet 1 08/18/2021 at 0800   tiZANidine (ZANAFLEX) 2 MG tablet Take 1 tablet (2 mg total) by mouth every 6 (six) hours as needed for muscle spasms. 30 tablet 1 08/18/2021 at 0800   traZODone (DESYREL) 100 MG tablet TAKE 1 TABLET AT BEDTIME 90 tablet 0 08/17/2021   fluconazole (DIFLUCAN) 150 MG tablet Take 1 tablet (150 mg total) by mouth once as needed for up to 1 dose (vaginal yeast infection symptoms). 1 tablet 0 PRN at PRN   glucose blood test strip       sucralfate (CARAFATE) 1 g tablet Take 1 tablet (1 g total) by mouth 4 (four) times daily -  with meals and at bedtime. (Patient not taking: Reported on 08/01/2021) 120 tablet 2     Social History   Socioeconomic History   Marital status: Widowed    Spouse name: Not on file   Number of children: Not on file   Years of education: Not on file   Highest education level: Not on file  Occupational History   Occupation: Disabled  Tobacco Use   Smoking status: Former    Packs/day: 1.00    Years: 20.00    Pack years: 20.00    Types: Cigarettes    Quit date: 07/02/1991    Years since quitting: 30.1   Smokeless tobacco: Never  Vaping Use   Vaping Use: Never used  Substance and Sexual Activity   Alcohol use: Yes    Comment: occasionally   Drug use: No  Sexual activity: Never  Other Topics Concern   Not on file  Social History Narrative   Pt lives with her 2 sisters   Social Determinants of Health   Financial Resource Strain: Not on file  Food Insecurity: Not on file  Transportation Needs: Not on file  Physical Activity: Not on file  Stress: Not on file  Social Connections: Not on file  Intimate Partner Violence: Not on file    Family History  Problem Relation Age of Onset   Colon cancer Father    Renal Disease Father    Diabetes Mellitus II Father    Melanoma Paternal Grandmother    Breast cancer Maternal Aunt 78   Anemia Mother    Heart disease Mother    Heart failure Mother    Renal Disease Mother    Congestive Heart Failure Mother    Heart disease Maternal Uncle    Throat cancer Maternal Uncle    Lung cancer Maternal Uncle    Liver disease Maternal Uncle    Heart failure Maternal Uncle    Hearing loss Son 30       Suicide       Review of systems complete and found to be negative unless listed above    PHYSICAL EXAM General: Pleasant, ill appearing Caucasian female, in no acute distress. Sitting upright in hospital bed eating yogurt.  No family at bedside. HEENT:  Normocephalic and atraumatic. Neck:  No JVD.  Lungs: Some conversation dyspnea on 3L by [place].  Coarse breath sounds throughout. Wet cough with deep inspiration and wheezes  anteriorly.  Heart: Tachycardic but regular. Difficult to hear heart sounds over breath sounds and wheezing.  3/6 systolic murmur.  Radial & DP pulses 2+ bilaterally. Abdomen: Soft, distended appearing.  Msk: Normal strength and tone for age. Extremities: Warm and well perfused. No clubbing, cyanosis. No LEE edema.  Neuro: Alert and oriented X 3. Psych:  Answers questions appropriately.   Labs:   Lab Results  Component Value Date   WBC 9.1 08/22/2021   HGB 10.4 (L) 08/22/2021   HCT 30.9 (L) 08/22/2021   MCV 80.3 08/22/2021   PLT 102 (L) 08/22/2021    Recent Labs  Lab 08/18/21 1731 08/21/21 0505 08/22/21 0533  NA 130*   < > 134*  K 3.9   < > 3.6  CL 99   < > 100  CO2 20*   < > 23  BUN 23   < > 32*  CREATININE 1.12*   < > 1.29*  CALCIUM 8.8*   < > 8.1*  PROT 6.7  --   --   BILITOT 0.5  --   --   ALKPHOS 92  --   --   ALT 57*  --   --   AST 37  --   --   GLUCOSE 146*   < > 151*   < > = values in this interval not displayed.    Lab Results  Component Value Date   TROPONINI <0.03 07/25/2017     Lab Results  Component Value Date   CHOL 118 08/21/2021   CHOL 177 12/05/2020   CHOL 186 11/30/2019   Lab Results  Component Value Date   HDL 48 08/21/2021   HDL 59 12/05/2020   HDL 71 11/30/2019   Lab Results  Component Value Date   LDLCALC 58 08/21/2021   LDLCALC 94 12/05/2020   LDLCALC 95 11/30/2019   Lab Results  Component Value Date  TRIG 61 08/21/2021   TRIG 140 12/05/2020   TRIG 114 11/30/2019   Lab Results  Component Value Date   CHOLHDL 2.5 08/21/2021   CHOLHDL 3.0 12/05/2020   CHOLHDL 2.6 11/30/2019   No results found for: LDLDIRECT    Radiology: DG Chest 1 View  Result Date: 08/21/2021 CLINICAL DATA:  Dyspnea. EXAM: CHEST  1 VIEW COMPARISON:  Radiograph 08/20/2021, yesterday.  CT 08/18/2021 FINDINGS: Right chest port in place. Stable heart size and mediastinal contours. Vascular congestion and perivascular haziness suspicious for pulmonary  edema, not significantly changed from prior. Suspected small bilateral pleural effusions are similar. Small perifissural right upper lobe opacity. No pneumothorax. IMPRESSION: Findings suspicious for pulmonary edema and small pleural effusions. Perifissural opacity in the right upper lobe may be related to fluid in the fissure or adjacent airspace disease. Electronically Signed   By: Keith Rake M.D.   On: 08/21/2021 22:49   DG Chest 1 View  Result Date: 08/19/2021 CLINICAL DATA:  Dyspnea. EXAM: CHEST  1 VIEW COMPARISON:  August 18, 2021 FINDINGS: There is stable right-sided venous Port-A-Cath positioning. The heart size and mediastinal contours are within normal limits. Both lungs are clear. Multilevel degenerative changes are seen throughout the thoracic spine. IMPRESSION: No active disease. Electronically Signed   By: Virgina Norfolk M.D.   On: 08/19/2021 21:19   DG Chest 2 View  Result Date: 08/18/2021 CLINICAL DATA:  Shortness of breath, cough, fever, suspected sepsis, multiple myeloma EXAM: CHEST - 2 VIEW COMPARISON:  08/17/2021 FINDINGS: The heart size and mediastinal contours are within normal limits. Both lungs are clear. Disc degenerative disease of the thoracic spine. IMPRESSION: No acute abnormality of the lungs. Electronically Signed   By: Delanna Ahmadi M.D.   On: 08/18/2021 18:13   DG Chest 2 View  Result Date: 08/17/2021 CLINICAL DATA:  Cough and low-grade fever. Multiple myeloma. Port in place. EXAM: CHEST - 2 VIEW COMPARISON:  06/23/2021 FINDINGS: Right chest wall porta catheter tip overlies the superior vena cava/right atrial junction. Cardiac silhouette and mediastinal contours are within normal limits. The lungs are clear. No pleural effusion or pneumothorax. Mild-to-moderate multilevel degenerative disc changes of the thoracic spine. Cholecystectomy clips. IMPRESSION: No active cardiopulmonary disease. Electronically Signed   By: Yvonne Kendall M.D.   On: 08/17/2021 13:56    CT Angio Chest PE W and/or Wo Contrast  Result Date: 08/18/2021 CLINICAL DATA:  Pulmonary embolism suspected, high probability. Shortness of breath and fever beginning 3 days ago. EXAM: CT ANGIOGRAPHY CHEST WITH CONTRAST TECHNIQUE: Multidetector CT imaging of the chest was performed using the standard protocol during bolus administration of intravenous contrast. Multiplanar CT image reconstructions and MIPs were obtained to evaluate the vascular anatomy. RADIATION DOSE REDUCTION: This exam was performed according to the departmental dose-optimization program which includes automated exposure control, adjustment of the mA and/or kV according to patient size and/or use of iterative reconstruction technique. CONTRAST:  33m OMNIPAQUE IOHEXOL 350 MG/ML SOLN COMPARISON:  Chest radiography same day. Prior CT angiography 03/17/2020 FINDINGS: Cardiovascular: The heart is enlarged. No pericardial fluid. No visible coronary artery calcification or aortic atherosclerotic calcification. Pulmonary arterial opacification is moderate. No pulmonary emboli are seen. Mediastinum/Nodes: Chronic calcified mediastinal lymph nodes as noted previously. Lungs/Pleura: No pulmonary infiltrate, collapse or effusion. Patient does have some scattered areas of bronchial thickening. No pleural effusion. Upper Abdomen: Fatty change of the liver. No focal lesion is visible. Previous cholecystectomy. Musculoskeletal: Negative Review of the MIP images confirms the above findings. IMPRESSION: No  pulmonary emboli. Mild cardiomegaly as seen previously. Possible bronchitis pattern. No pulmonary consolidation or collapse. No pleural effusion. Chronic calcified mediastinal lymph nodes. Electronically Signed   By: Nelson Chimes M.D.   On: 08/18/2021 20:08   US Abdomen Complete  Result Date: 07/26/2021 CLINICAL DATA:  66 year old female with abnormal LFTs. Prior cholecystectomy. EXAM: ABDOMEN ULTRASOUND COMPLETE COMPARISON:  CT Abdomen and Pelvis  03/05/2021, 10/16/2016. Renal ultrasound 01/24/2020. FINDINGS: Gallbladder: Surgically absent. Common bile duct: Diameter: 5 mm, normal. Liver: Echogenic liver (image 16). No discrete liver lesion. No intrahepatic biliary ductal dilatation. Portal vein is patent on color Doppler imaging with normal direction of blood flow towards the liver. IVC: No abnormality visualized. Pancreas: Visualized portion unremarkable. Spleen: Size and appearance within normal limits. Right Kidney: Length: 10.2 cm. Echogenicity within normal limits. No mass or hydronephrosis visualized. Left Kidney: Length: 9.3 cm. Small complex echogenic midpole lesion (image 60). Was not apparent on the 2021 ultrasound, but probably corresponds to a small 12 mm hypodense heterogeneous area on the CT last year, which was present in 2018. This is 11 x 15 x 17 mm by ultrasound. There is some comet tail artifact in this area by ultrasound now (image 64). No other left renal mass. No hydronephrosis. Preserved left renal cortical echogenicity. Abdominal aorta: No aneurysm visualized. Other findings: None. IMPRESSION: 1. Evidence of hepatic steatosis. Absent gallbladder no evidence of bile duct obstruction. 2. Small up to 1.7 cm complex, mixed echogenic lesion of the left kidney. This might reflect dystrophic calcification of a chronic cyst seen previously by CT, uncertain. But it was not apparent by prior renal ultrasound, most recently in 2021. Recommend dedicated Renal Mass Protocol Abdomen MRI or CT (without and with contrast) to best characterize. Electronically Signed   By: Genevie Ann M.D.   On: 07/26/2021 11:23   DG Chest Port 1 View  Result Date: 08/20/2021 CLINICAL DATA:  Shortness of breath, cough EXAM: PORTABLE CHEST 1 VIEW COMPARISON:  Previous studies including the examination of 08/19/2021 FINDINGS: Transverse diameter of heart is within normal limits. Tip of right IJ chest port is seen in the superior vena cava close to the right atrium.  Central pulmonary vessels are more prominent. There is interval increase in interstitial markings in both parahilar regions and both lower lung fields. There is no focal consolidation. There is minimal blunting of lateral CP angles. There is no pneumothorax. IMPRESSION: Central pulmonary vessels are more prominent suggesting CHF. Increased interstitial markings are seen in both lungs suggesting interstitial pulmonary edema. Less likely possibility would be interstitial pneumonia. Minimal bilateral pleural effusions. Electronically Signed   By: Elmer Picker M.D.   On: 08/20/2021 17:59   ECHOCARDIOGRAM COMPLETE  Result Date: 08/21/2021    ECHOCARDIOGRAM REPORT   Patient Name:   [facility] Date of Exam: 08/21/2021 Medical Rec #:  [id number]        Height:       62.0 in Accession #:    [id number]       Weight:       155.6 lb Date of Birth:  Nov 11, 1955         BSA:          1.718 m Patient Age:    53 years         BP:           138/85 mmHg Patient Gender: F                HR:  91 bpm. Exam Location:  [facility] Procedure: 2D Echo, Cardiac Doppler and Color Doppler Indications:     Chest pain R07.9  History:         Patient has prior history of Echocardiogram examinations, most                  recent 11/16/2019. Cardiomyopathy; Risk Factors:Diabetes and                  Hypertension. Bicuspid AOV.  Sonographer:     Sherrie Sport Referring Phys:  [id number] Mena L FOUST Diagnosing Phys: Yolonda Kida MD  Sonographer Comments: Suboptimal apical window. IMPRESSIONS  1. Ant/Apical/Septal/High Lateral Hypo.  2. Bicusp Severe AS.  3. Left ventricular ejection fraction, by estimation, is 35 to 40%. The left ventricle has moderately decreased function. The left ventricle demonstrates regional wall motion abnormalities (see scoring diagram/findings for description). Left ventricular  diastolic parameters were normal.  4. Right ventricular systolic function is normal. The right ventricular size is normal.  5. Left  atrial size was mildly dilated.  6. The mitral valve is normal in structure. Mild mitral valve regurgitation.  7. The aortic valve is bicuspid. There is moderate calcification of the aortic valve. There is moderate thickening of the aortic valve. Aortic valve regurgitation is trivial. Severe aortic valve stenosis. FINDINGS  Left Ventricle: Left ventricular ejection fraction, by estimation, is 35 to 40%. The left ventricle has moderately decreased function. The left ventricle demonstrates regional wall motion abnormalities. The left ventricular internal cavity size was normal in size. There is no left ventricular hypertrophy. Left ventricular diastolic parameters were normal. Right Ventricle: The right ventricular size is normal. No increase in right ventricular wall thickness. Right ventricular systolic function is normal. Left Atrium: Left atrial size was mildly dilated. Right Atrium: Right atrial size was normal in size. Pericardium: There is no evidence of pericardial effusion. Mitral Valve: The mitral valve is normal in structure. Mild mitral valve regurgitation. MV peak gradient, 4.7 mmHg. The mean mitral valve gradient is 2.0 mmHg. Tricuspid Valve: The tricuspid valve is normal in structure. Tricuspid valve regurgitation is mild. Aortic Valve: The aortic valve is bicuspid. There is moderate calcification of the aortic valve. There is moderate thickening of the aortic valve. There is moderate aortic valve annular calcification. Aortic valve regurgitation is trivial. Severe aortic stenosis is present. Aortic valve mean gradient measures 14.7 mmHg. Aortic valve peak gradient measures 24.5 mmHg. Aortic valve area, by VTI measures 0.56 cm. Pulmonic Valve: The pulmonic valve was normal in structure. Pulmonic valve regurgitation is trivial. Aorta: The ascending aorta was not well visualized. IAS/Shunts: No atrial level shunt detected by color flow Doppler. Additional Comments: Ant/Apical/Septal/High Lateral Hypo.  Bicusp Severe AS.  LEFT VENTRICLE PLAX 2D LVIDd:         4.50 cm     Diastology LVIDs:         3.30 cm     LV e' medial:    5.66 cm/s LV PW:         1.00 cm     LV E/e' medial:  18.4 LV IVS:        0.80 cm     LV e' lateral:   13.50 cm/s LVOT diam:     2.00 cm     LV E/e' lateral: 7.7 LV SV:         28 LV SV Index:   16 LVOT Area:     3.14 cm  LV Volumes (MOD)  LV vol d, MOD A2C: 90.9 ml LV vol d, MOD A4C: 61.0 ml LV vol s, MOD A2C: 63.3 ml LV vol s, MOD A4C: 33.7 ml LV SV MOD A2C:     27.6 ml LV SV MOD A4C:     61.0 ml LV SV MOD BP:      29.5 ml RIGHT VENTRICLE RV Basal diam:  2.50 cm RV S prime:     12.10 cm/s TAPSE (M-mode): 2.6 cm LEFT ATRIUM             Index        RIGHT ATRIUM           Index LA diam:        4.40 cm 2.56 cm/m   RA Area:     16.00 cm LA Vol (A2C):   72.6 ml 42.25 ml/m  RA Volume:   41.80 ml  24.32 ml/m LA Vol (A4C):   52.6 ml 30.61 ml/m LA Biplane Vol: 64.0 ml 37.24 ml/m  AORTIC VALVE AV Area (Vmax):    0.63 cm AV Area (Vmean):   0.51 cm AV Area (VTI):     0.56 cm AV Vmax:           247.67 cm/s AV Vmean:          175.667 cm/s AV VTI:            0.503 m AV Peak Grad:      24.5 mmHg AV Mean Grad:      14.7 mmHg LVOT Vmax:         49.60 cm/s LVOT Vmean:        28.600 cm/s LVOT VTI:          0.090 m LVOT/AV VTI ratio: 0.18  AORTA Ao Root diam: 2.97 cm MITRAL VALVE                TRICUSPID VALVE MV Area (PHT): 4.33 cm     TR Peak grad:   29.2 mmHg MV Area VTI:   0.87 cm     TR Vmax:        270.00 cm/s MV Peak grad:  4.7 mmHg MV Mean grad:  2.0 mmHg     SHUNTS MV Vmax:       1.08 m/s     Systemic VTI:  0.09 m MV Vmean:      69.6 cm/s    Systemic Diam: 2.00 cm MV Decel Time: 175 msec MV E velocity: 104.00 cm/s MV A velocity: 86.50 cm/s MV E/A ratio:  1.20 Dwayne D Callwood MD Electronically signed by Yolonda Kida MD Signature Date/Time: 08/21/2021/4:07:57 PM    Final     ECHO 08/21/2021 LVEF 35-40% with anterior, apical, septal, high lateral hypokinesis, mild MR, severe AS with known  bicuspid aortic valve.  01/24/2021 Summary    1. The left ventricle is normal in size with normal wall thickness.    2. The left ventricular systolic function is normal, LVEF is visually  estimated at > 55%.    3. Moderate to severe paradoxical low-flow low-gradient aortic stenosis.    4. The right ventricle is normal in size, with normal systolic function.   EKG reviewed by me and Dr. Clayborn Bigness: sinus tach, rate 102, LBBB, PVCs. LBBB new compared to EKG in 05/2021  Telemetry reviewed by me: Ventricular bigeminy with rate in the low 100s and peak of 130s   Cardiac Cath 02/21/2016  Coronary angiography:  1. Left main trunk is normal,  divides into LAD and circumflex arteries.  Left main is short.  2. LAD starts as a larger caliber vessel, becomes small as it travels down  to the apex from the mid to distal segment. No focal stenosis is  identified in the LAD. Diagonal branches are free of disease.  3. Circumflex and its branches is angiographically normal.  4. Right coronary artery and its branches is angiographically normal.   CONCLUSION(S):  1. Angiographically unremarkable epicardial coronaries.  2. Preserved LV systolic function by echocardiogram.  3. Small gradient across the aortic valve.  4. False positive Cardiolite stress testing possibly related to a small  distal LAD.   ASSESSMENT AND PLAN:  The patient is a 66 year old female with a past medical history notable for multiple myeloma s/p autologous stem cell transplant and currently on maintenance chemotherapy, chronic thrombocytopenia, bicuspid aortic valve with nonrheumatic severe AS, SVT s/p ablation in 1990s, PVCs, diabetes, chronic pain from spinal stenosis who presented to [facility] ED 08/18/2021 with a fever of 103, cough, sore throat, hoarseness x2 days was found to have sepsis likely 2/2 viral URI.  Cardiology is consulted because of her elevated troponin which peaked at 2,239. She has a newly depressed EF of 35-40% with LV  hypokinesis.   #NSTEMI #new LBBB #acute HFrEF (LVEF 35-40%) #Sepsis likely 2/2 viral URI The patient admits to a 3-4 minute episode of chest squeezing and associated dyspnea and diaphoresis that occurred while she was walking form the bathroom back to her hospital bed the evening of 2/20. She has some exertional dyspnea and weakness at baseline thought to be due to her known severe AS, however this chest squeezing and associated symptoms are new and with her peak troponin of 2239 it's concerning for underlying coronary disease.  -Agree with current therapy for sepsis 2/2 metapneumovirus versus -S/p aspirin 324 mg, continue 81 mg aspirin daily -continue heparin drip for 72 hours per Dr. Clayborn [place] (end 0100 on 2/24)  -On bisoprolol 5 mg at home.  I reordered metoprolol tartrate 12.17m BID due to her tachycardia, with goals to uptitrate if her blood pressure allows tomorrow morning. -s/p lasix IV 465mx 2 and net negative 2.6L in the last 24 hours.  Diuresis held due to bump in creatinine.  Will likely transition to PO lasix 4014maily at discharge. -PRN SL nitro -continue monitoring on telemetry while inpatient -Further ischemic workup is needed but will be based on her clinical course, likely on an outpatient basis with after she recovers from this acute infection per Dr. CalClayborn Bigness #Multiple myeloma s/p stem cell transplant, on maintenance chemo #Chronic thrombocytopenia Platelets improving to 102 today.  We will continue close monitoring of her platelets.   #Bicuspid aortic valve with nonrheumatic severe AS - will need [facility] plan [facility] was discussed and created with Dr. DwaLujean Ameld he is in agreement.  Signed: LilTristan SchroederPA-C 08/22/2021, 1:25 PM [facility]

## 2021-08-22 NOTE — Assessment & Plan Note (Signed)
Acute on chronic thrombocytopenia, likely due to acute infection.  Improving, platelet at 102 today -Continue to monitor -Oncology on board

## 2021-08-22 NOTE — Assessment & Plan Note (Signed)
S/p autologous bone marrow transplant. Oncology on board and they were holding maintenance injections of daratumumab due to acute infection. -Continue to monitor

## 2021-08-22 NOTE — Progress Notes (Signed)
ANTICOAGULATION CONSULT NOTE ° °Pharmacy Consult for heparin infusion °Indication: ACS/STEMI ° °Allergies  °Allergen Reactions  ° Oxycodone-Acetaminophen Anaphylaxis  °  Swelling and rash  ° Celebrex [Celecoxib] Diarrhea  ° Codeine   ° Plerixafor   °  In 2016 during ASCT collection patient developed fever to 103.5F and required hospitalization  ° Benadryl [Diphenhydramine] Palpitations  ° Morphine Itching and Rash  ° Ondansetron Diarrhea  ° Tylenol [Acetaminophen] Itching and Rash  ° ° °Patient Measurements: °Height: 5' 2" (157.5 cm) °Weight: 70.6 kg (155 lb 10.3 oz) °IBW/kg (Calculated) : 50.1 °Heparin Dosing Weight: 65kg ° °Vital Signs: °Temp: 98.7 °F (37.1 °C) (02/22 1220) °Temp Source: Oral (02/22 0528) °BP: 130/89 (02/22 1220) °Pulse Rate: 54 (02/22 1220) ° °Labs: °Recent Labs  °  08/20/21 °0452 08/20/21 °1652 08/21/21 °0505 08/21/21 °0725 08/21/21 °0928 08/21/21 °1230 08/22/21 °0533 08/22/21 °0857 08/22/21 °1027 08/22/21 °1519  °HGB 9.7*  --  10.4*  --   --   --  10.4*  --   --   --   °HCT 30.0*  --  31.8*  --   --   --  30.9*  --   --   --   °PLT 68*  --  89*  --   --   --  102*  --   --   --   °APTT  --   --  51*  --   --   --   --   --   --   --   °LABPROT  --   --  14.5  --   --   --   --   --   --   --   °INR  --   --  1.1  --   --   --   --   --   --   --   °HEPARINUNFRC  --    < > 0.40  --   --  0.34 0.29*  --   --  >1.10*  °CREATININE  --   --  1.08*  --   --   --  1.29*  --   --   --   °TROPONINIHS  --    < > 2,239*   < > 2,143*  --   --  1,176* 1,194*  --   ° < > = values in this interval not displayed.  ° ° ° °Estimated Creatinine Clearance: 40.5 mL/min (A) (by C-G formula based on SCr of 1.29 mg/dL (H)). 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  °  Due to Zometa  ° Cardiomyopathy, idiopathic (HCC) 08/21/2017  ° Overview:  Septal and apical hypokinesis with ef 40%  ° CHF (congestive heart failure) (HCC)   ° CKD (chronic kidney disease) stage 3, GFR 30-59 ml/min (HCC)   ° Depression   ° Diabetes mellitus (HCC)   ° Dizziness   ° Fatty liver   ° Frequent falls   ° GERD (gastroesophageal reflux disease)   ° Gout   ° Heart murmur   ° History of blood transfusion   ° History of bone marrow transplant (HCC)   ° History of uterine fibroid   ° Hx of cardiac catheterization 06/05/2016  °   Overview:  Normal coronaries 2017   Hypertension    Hypomagnesemia    IDA (iron deficiency anemia)    Multiple myeloma (HCC)    Personal history of chemotherapy    Renal cyst    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 Presented to ED on 2/18 with SOB. Then, on 2/20, found to have elevated troponins (pk 2,143). EKG showed LBBB, ECHO with depressed EF with regional wall motion abnormalities consistent with NSTEMI.  CBC stable. Hgb 9.7 > 10.4. Plt improved from 68>102.   2/22 update: Heparin discontinued overnight. Per RN, patient coughing overnight with bloody sputum, however actual content of blood in sputum was minimal. Plan is to continue heparin infusion for 72 hours.  Goal of Therapy:  Heparin level 0.3-0.7 units/ml Monitor platelets by anticoagulation protocol: Yes  2/21 0505 HL 0.40, therapeutic x 1, Plt 89 2/21 1230 HL 0.34, therapeutic x 2,  2/22 0200 --heparin discontinued-- 2/22 1519 HL >1.1  <--(will get STAT repeat, since doesn't fit the expected trend and no dose change) 2/22 1719 HL >1.1 <-- (despite repeat; will hold x1 hr, then resume lower) 800 > 650  un/hr   Plan: STAT HL on repeat >1.1, doesn't fit the expected trend and no recent dose changes but will dose accordingly. Hold heparin infusion x1 hr (1800-1900); resume at reduced rate of 650 unit/hr Recheck heparin level in 6 hour after rate change Daily CBC, HL. Monitor for any new or worsening bleeding.  Lorna Dibble, PharmD, [facility] Clinical Pharmacist 08/22/2021 4:44 PM

## 2021-08-22 NOTE — Plan of Care (Signed)
°  Problem: Clinical Measurements: Goal: Respiratory complications will improve Outcome: Progressing   Problem: Clinical Measurements: Goal: Cardiovascular complication will be avoided Outcome: Progressing   Problem: Activity: Goal: Risk for activity intolerance will decrease Outcome: Progressing   Problem: Coping: Goal: Level of anxiety will decrease Outcome: Progressing   Problem: Elimination: Goal: Will not experience complications related to urinary retention Outcome: Progressing   Problem: Pain Managment: Goal: General experience of comfort will improve Outcome: Progressing   Problem: Safety: Goal: Ability to remain free from injury will improve Outcome: Progressing

## 2021-08-23 ENCOUNTER — Other Ambulatory Visit: Payer: Self-pay

## 2021-08-23 ENCOUNTER — Inpatient Hospital Stay: Payer: Medicare HMO

## 2021-08-23 DIAGNOSIS — C9001 Multiple myeloma in remission: Secondary | ICD-10-CM | POA: Diagnosis not present

## 2021-08-23 DIAGNOSIS — I5021 Acute systolic (congestive) heart failure: Secondary | ICD-10-CM

## 2021-08-23 DIAGNOSIS — A419 Sepsis, unspecified organism: Secondary | ICD-10-CM | POA: Diagnosis not present

## 2021-08-23 DIAGNOSIS — R509 Fever, unspecified: Secondary | ICD-10-CM | POA: Diagnosis not present

## 2021-08-23 DIAGNOSIS — Z9484 Stem cells transplant status: Secondary | ICD-10-CM | POA: Diagnosis not present

## 2021-08-23 DIAGNOSIS — R768 Other specified abnormal immunological findings in serum: Secondary | ICD-10-CM | POA: Diagnosis not present

## 2021-08-23 DIAGNOSIS — J40 Bronchitis, not specified as acute or chronic: Secondary | ICD-10-CM | POA: Diagnosis not present

## 2021-08-23 DIAGNOSIS — J208 Acute bronchitis due to other specified organisms: Secondary | ICD-10-CM | POA: Diagnosis not present

## 2021-08-23 LAB — BASIC METABOLIC PANEL
Anion gap: 8 (ref 5–15)
Anion gap: 8 (ref 5–15)
BUN: 26 mg/dL — ABNORMAL HIGH (ref 8–23)
BUN: 25 mg/dL — ABNORMAL HIGH (ref 8–23)
CO2: 25 mmol/L (ref 22–32)
CO2: 23 mmol/L (ref 22–32)
Calcium: 8.1 mg/dL — ABNORMAL LOW (ref 8.9–10.3)
Calcium: 8 mg/dL — ABNORMAL LOW (ref 8.9–10.3)
Chloride: 101 mmol/L (ref 98–111)
Chloride: 101 mmol/L (ref 98–111)
Creatinine, Ser: 1.06 mg/dL — ABNORMAL HIGH (ref 0.44–1.00)
Creatinine, Ser: 1.12 mg/dL — ABNORMAL HIGH (ref 0.44–1.00)
GFR, Estimated: 59 mL/min — ABNORMAL LOW (ref >60)
GFR, Estimated: 55 mL/min — ABNORMAL LOW (ref >60)
Glucose, Bld: 187 mg/dL — ABNORMAL HIGH (ref 70–99)
Glucose, Bld: 173 mg/dL — ABNORMAL HIGH (ref 70–99)
Potassium: 3.8 mmol/L (ref 3.5–5.1)
Potassium: 4 mmol/L (ref 3.5–5.1)
Sodium: 134 mmol/L — ABNORMAL LOW (ref 135–145)
Sodium: 132 mmol/L — ABNORMAL LOW (ref 135–145)

## 2021-08-23 LAB — GLUCOSE, CAPILLARY
Glucose-Capillary: 135 mg/dL — ABNORMAL HIGH (ref 70–99)
Glucose-Capillary: 200 mg/dL — ABNORMAL HIGH (ref 70–99)
Glucose-Capillary: 116 mg/dL — ABNORMAL HIGH (ref 70–99)
Glucose-Capillary: 79 mg/dL (ref 70–99)

## 2021-08-23 LAB — CBC
HCT: 30 % — ABNORMAL LOW (ref 36.0–46.0)
Hemoglobin: 10.1 g/dL — ABNORMAL LOW (ref 12.0–15.0)
MCH: 27.1 pg (ref 26.0–34.0)
MCHC: 33.7 g/dL (ref 30.0–36.0)
MCV: 80.4 fL (ref 80.0–100.0)
Platelets: 109 10*3/uL — ABNORMAL LOW (ref 150–400)
RBC: 3.73 MIL/uL — ABNORMAL LOW (ref 3.87–5.11)
RDW: 14.9 % (ref 11.5–15.5)
WBC: 7.7 10*3/uL (ref 4.0–10.5)
nRBC: 0 % (ref 0.0–0.2)

## 2021-08-23 LAB — HEPARIN LEVEL (UNFRACTIONATED)
Heparin Unfractionated: >1.1 IU/mL — ABNORMAL HIGH (ref 0.30–0.70)
Heparin Unfractionated: <0.1 IU/mL — ABNORMAL LOW (ref 0.30–0.70)
Heparin Unfractionated: 0.11 IU/mL — ABNORMAL LOW (ref 0.30–0.70)

## 2021-08-23 LAB — CULTURE, BLOOD (ROUTINE X 2)
Culture: NO GROWTH
Culture: NO GROWTH
Special Requests: ADEQUATE
Special Requests: ADEQUATE

## 2021-08-23 LAB — MRSA NEXT GEN BY PCR, NASAL: MRSA by PCR Next Gen: NOT DETECTED

## 2021-08-23 LAB — TROPONIN I (HIGH SENSITIVITY)
Troponin I (High Sensitivity): 915 ng/L (ref <18)
Troponin I (High Sensitivity): 840 ng/L (ref <18)

## 2021-08-23 LAB — PROCALCITONIN: Procalcitonin: 0.43 ng/mL

## 2021-08-23 LAB — MAGNESIUM: Magnesium: 2 mg/dL (ref 1.7–2.4)

## 2021-08-23 MED ORDER — ACETAMINOPHEN 325 MG PO TABS: 650.0000 mg | ORAL_TABLET | Freq: Once | ORAL | Status: DC

## 2021-08-23 MED ORDER — ALPRAZOLAM 0.25 MG PO TABS
0.2500 mg | ORAL_TABLET | Freq: Once | ORAL | Status: AC
  Administered 2021-08-23: 0.25 mg via ORAL
  Filled 2021-08-23: qty 1

## 2021-08-23 MED ORDER — IBUPROFEN 400 MG PO TABS
400.0000 mg | ORAL_TABLET | Freq: Once | ORAL | Status: AC
  Administered 2021-08-23: 400 mg via ORAL
  Filled 2021-08-23: qty 1

## 2021-08-23 MED ORDER — ISOSORBIDE MONONITRATE ER 30 MG PO TB24
30.0000 mg | ORAL_TABLET | Freq: Every day | ORAL | Status: DC
  Administered 2021-08-23 – 2021-08-25 (×3): 30 mg via ORAL
  Filled 2021-08-23 (×4): qty 1

## 2021-08-23 MED ORDER — HEPARIN (PORCINE) 25000 UT/250ML-% IV SOLN
900.0000 [IU]/h | INTRAVENOUS | Status: AC
  Administered 2021-08-23: 500 [IU]/h via INTRAVENOUS

## 2021-08-23 NOTE — Assessment & Plan Note (Signed)
Met sepsis criteria with fever, tachycardia, tachypnea, leukocytosis and respiratory viral panel positive for metapneumovirus.  He received IV antibiotics which were later discontinued as initial procalcitonin was negative. Overnight became febrile again with worsening shortness of breath.  Repeat chest x-ray with bilateral infiltrates/pulmonary edema.  Procalcitonin at 0.44>>0.43  -Continue ceftriaxone and Zithromax for concern of superadded bacterial pneumonia. -Currently on acyclovir

## 2021-08-23 NOTE — Progress Notes (Signed)
Cross Cover Dose of xanax ordered for patient experiencing severe anxiety symptoms

## 2021-08-23 NOTE — Progress Notes (Addendum)
ANTICOAGULATION CONSULT NOTE  Pharmacy Consult for heparin infusion Indication: ACS/STEMI  Allergies  Allergen Reactions   Oxycodone-Acetaminophen Anaphylaxis    Swelling and rash   Celebrex [Celecoxib] Diarrhea   Codeine    Plerixafor     In 2016 during ASCT collection patient developed fever to 103.40F and required hospitalization   Benadryl [Diphenhydramine] Palpitations   Morphine Itching and Rash   Ondansetron Diarrhea   Tylenol [Acetaminophen] Itching and Rash    Patient Measurements: Height: 5' 2"  (157.5 cm) Weight: 70.6 kg (155 lb 10.3 oz) IBW/kg (Calculated) : 50.1 Heparin Dosing Weight: 65kg  Vital Signs: Temp: 98.5 F (36.9 C) (02/23 0948) Temp Source: Oral (02/23 0948) BP: 125/67 (02/23 0948) Pulse Rate: 97 (02/23 0948)  Labs: Recent Labs    08/21/21 0505 08/21/21 0725 08/22/21 0533 08/22/21 0857 08/22/21 1027 08/22/21 1519 08/22/21 1710 08/23/21 0056 08/23/21 0220 08/23/21 0410 08/23/21 0955  HGB 10.4*  --  10.4*  --   --   --   --   --  10.1*  --   --   HCT 31.8*  --  30.9*  --   --   --   --   --  30.0*  --   --   PLT 89*  --  102*  --   --   --   --   --  109*  --   --   APTT 51*  --   --   --   --   --   --   --   --   --   --   LABPROT 14.5  --   --   --   --   --   --   --   --   --   --   INR 1.1  --   --   --   --   --   --   --   --   --   --   HEPARINUNFRC 0.40   < > 0.29*  --   --    < > >1.10*  --  >1.10*  --  <0.10*  CREATININE 1.08*  --  1.29*  --   --   --   --  1.06* 1.12*  --   --   TROPONINIHS 2,239*   < >  --    < > 1,194*  --   --   --  915* 840*  --    < > = values in this interval not displayed.     Estimated Creatinine Clearance: 46.7 mL/min (A) (by C-G formula based on SCr of 1.12 mg/dL (H)).   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   Due to Zometa   Cardiomyopathy, idiopathic ([place]) 08/21/2017   Overview:  Septal and apical hypokinesis with ef 40%   CHF (congestive heart failure) (HCC)    CKD (chronic kidney disease) stage 3, GFR 30-59 ml/min (HCC)    Depression    Diabetes mellitus (HCC)    Dizziness    Fatty liver    Frequent falls    GERD (gastroesophageal reflux disease)    Gout    Heart murmur    History of blood transfusion  History of bone marrow transplant ([place])    History of uterine fibroid    Hx of cardiac catheterization 06/05/2016   Overview:  Normal coronaries 2017   Hypertension    Hypomagnesemia    IDA (iron deficiency anemia)    Multiple myeloma (HCC)    Personal history of chemotherapy    Renal cyst    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 Presented to ED on 2/18 with SOB. Then, on 2/20, found to have elevated troponins (pk 2,143). EKG showed LBBB, ECHO with depressed EF with regional wall motion abnormalities consistent with NSTEMI.  CBC stable. Hgb 9.7 > 10.4. Plt improved from 68>102.   2/22 update: Heparin discontinued overnight. Per RN, patient coughing overnight with bloody sputum, however actual content of blood in sputum was minimal. Plan is to continue heparin infusion for 72 hours.  Goal of Therapy:  Heparin level 0.3-0.7 units/ml Monitor platelets by anticoagulation protocol: Yes  2/21 0505 HL 0.40, therapeutic x 1, Plt 89 2/21 1230 HL 0.34, therapeutic x 2,  2/22 0200 --heparin discontinued-- 2/22 1519 HL >1.1  <--(will get STAT repeat, since doesn't fit the expected trend and no dose change) 2/22 1719 HL >1.1  <-- (despite repeat; will hold x1 hr, then resume lower) 800 > 650 un/hr 2/23 0220 HL >1.1, supratherapeutic 2/23 0955 HL < 0.1    Plan:  Heparin level is subtherapeutic. Will increase infusion to 700 units/hr. Recheck heparin level in 6 hours. CBC daily while on heparin. Previous Rph recommended to have next HL drawn from alternate location to confirm chest port is not affecting levels.   Eleonore Chiquito, PharmD, 08/23/2021 11:18 AM

## 2021-08-23 NOTE — Progress Notes (Signed)
° °      CROSS COVER NOTE  NAME: Sarah [name] MRN: [id number] DOB : 1955/12/18  0010: Secure chat received from RN "Tele reports pt is in bigeminy with pvcs"  On chart review patient's EKG has demonstrated bigeminy since day of admission 08/18/21. BP 112/68.  Plan: Ventricular bigeminy - EKG  - BMP - Mg  0031: Secure chat received from RN "States that she is now having left sided chest pain that is sharp"  Plan:  Recurrent chest pain -EKG -reviewed with night attendings, does not meet criteria for     Acute STEMI. Continue with medical management per cardiology. - Nitro SL - Troponin 915 -->840, both downtrending from prior   Josephina Shih, MSN, FNP-BC Nurse Practitioner [facility] Pager [phone]

## 2021-08-23 NOTE — Progress Notes (Signed)
[place] NOTE       Patient ID: Sarah [name] MRN: [id number] DOB/AGE: [birth year] 66 y.o.  Admit date: 08/18/2021 Referring Physician Neomia Glass, NP Primary Physician  Primary Cardiologist Dr. Posey Boyer [facility] Cardiology) Reason for Consultation elevated troponin  HPI: The patient is a 66 year old female with a past medical history notable for multiple myeloma s/p autologous stem cell transplant and currently on maintenance chemotherapy, chronic thrombocytopenia, bicuspid aortic valve with nonrheumatic severe AS, SVT s/p ablation in 1990s, PVCs, diabetes, chronic pain from spinal stenosis who presented to [facility] ED 08/18/2021 with a fever of 103, cough, sore throat, hoarseness x2 days was found to have sepsis likely 2/2 viral URI.  Cardiology is consulted because of her elevated troponin which peaked at 2,239. She has a newly depressed EF of 35-40% with LV hypokinesis.   Interval history: -still feels bad today, persistent dry cough but off supplemental oxygen today. Has some chest pain with coughing.  -low grade fever overnight and had another episode of central chest pain at rest in the early morning. She was given SL nitro x2, zanaflex and ibuprofen with eventual relief. Troponin continues to downtrend. EKG showed ventricular bigiminy without acute ST changes.    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   Due to Zometa   Cardiomyopathy, idiopathic ([place]) 08/21/2017   Overview:  Septal and apical hypokinesis with ef 40%   CHF (congestive heart failure) (HCC)    CKD (chronic kidney disease) stage 3, GFR 30-59 ml/min (HCC)    Depression    Diabetes mellitus  (HCC)    Dizziness    Fatty liver    Frequent falls    GERD (gastroesophageal reflux disease)    Gout    Heart murmur    History of blood transfusion    History of bone marrow transplant ([place])    History of uterine fibroid    Hx of cardiac catheterization 06/05/2016   Overview:  Normal coronaries 2017   Hypertension    Hypomagnesemia    IDA (iron deficiency anemia)    Multiple myeloma ([place])    Personal history of chemotherapy    Renal cyst    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[place]) 06/05/2016   Overview:  Sp ablation 1999    Past Surgical History:  Procedure Laterality Date   ABDOMINAL HYSTERECTOMY     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   CARDIAC ELECTROPHYSIOLOGY MAPPING AND ABLATION     CARPAL TUNNEL RELEASE Bilateral    CHOLECYSTECTOMY  2008   COLONOSCOPY WITH PROPOFOL N/A 05/07/2017   Procedure: COLONOSCOPY WITH PROPOFOL;  Surgeon: [name], MD;  Location: [facility] ENDOSCOPY;  Service: Gastroenterology;  Laterality: N/A;   COLONOSCOPY WITH PROPOFOL N/A 12/19/2020   Procedure: COLONOSCOPY WITH PROPOFOL;  Surgeon: [name], MD;  Location: [facility] ENDOSCOPY;  Service: Gastroenterology;  Laterality: N/A;   ESOPHAGOGASTRODUODENOSCOPY (EGD) WITH PROPOFOL N/A 05/07/2017   Procedure: ESOPHAGOGASTRODUODENOSCOPY (EGD) WITH PROPOFOL;  Surgeon: [name], MD;  Location: [facility] ENDOSCOPY;  Service: Gastroenterology;  Laterality: N/A;   FOOT SURGERY Bilateral    INCONTINENCE SURGERY  2009   INTERSTIM IMPLANT PLACEMENT     other  over active bladder   OTHER SURGICAL HISTORY     bladder stimulator    PARTIAL HYSTERECTOMY  03/1996   fibroids   PORTA CATH INSERTION N/A 03/10/2019   Procedure: PORTA CATH INSERTION;  Surgeon: Algernon Huxley, MD;  Location: [facility] CV LAB;  Service: Cardiovascular;  Laterality: N/A;   TONSILLECTOMY  2007    Medications Prior to Admission  Medication Sig Dispense Refill Last Dose   acyclovir (ZOVIRAX) 400 MG tablet Take 1 tablet (400 mg total) by mouth 2 (two)  times daily. 180 tablet 1 Past Week at UNKNOWN   albuterol (VENTOLIN HFA) 108 (90 Base) MCG/ACT inhaler Inhale 2 puffs into the lungs every 6 (six) hours as needed for wheezing or shortness of breath. 8 g 0 PRN at PRN   allopurinol (ZYLOPRIM) 100 MG tablet TAKE 1 TABLET(100 MG) BY MOUTH DAILY as needed 90 tablet 1 PRN at PRN   bisoprolol (ZEBETA) 5 MG tablet Take 5 mg by mouth daily.   08/17/2021 at 2200   diclofenac sodium (VOLTAREN) 1 % GEL Apply 2 g topically 4 (four) times daily. 100 g 1 PRN at PRN   FLUoxetine (PROZAC) 40 MG capsule TAKE 1 CAPSULE EVERY DAY 90 capsule 0 08/18/2021 at 0800   fluticasone (FLONASE) 50 MCG/ACT nasal spray Place 2 sprays into both nostrils daily. 16 g 2 08/17/2021 at 2200   montelukast (SINGULAIR) 10 MG tablet Take 1 tablet (10 mg total) by mouth See admin instructions. Take 1 tab daily for 2 days after daratumumab treatments 30 tablet 1 Past Week at UNKNOWN   Multiple Vitamin (MULTIVITAMIN) tablet Take 1 tablet by mouth daily.   08/18/2021 at 0800   omeprazole (PRILOSEC) 40 MG capsule TAKE 1 CAPSULE (40 MG TOTAL) BY MOUTH IN THE MORNING AND AT BEDTIME. 180 capsule 1 08/18/2021 at 0800   promethazine (PHENERGAN) 25 MG tablet Take 1 tablet (25 mg total) by mouth every 6 (six) hours as needed for nausea or vomiting. 90 tablet 1 08/18/2021 at 0800   tiZANidine (ZANAFLEX) 2 MG tablet Take 1 tablet (2 mg total) by mouth every 6 (six) hours as needed for muscle spasms. 30 tablet 1 08/18/2021 at 0800   traZODone (DESYREL) 100 MG tablet TAKE 1 TABLET AT BEDTIME 90 tablet 0 08/17/2021   fluconazole (DIFLUCAN) 150 MG tablet Take 1 tablet (150 mg total) by mouth once as needed for up to 1 dose (vaginal yeast infection symptoms). 1 tablet 0 PRN at PRN   glucose blood test strip       sucralfate (CARAFATE) 1 g tablet Take 1 tablet (1 g total) by mouth 4 (four) times daily -  with meals and at bedtime. (Patient not taking: Reported on 08/01/2021) 120 tablet 2    Social History    Socioeconomic History   Marital status: Widowed    Spouse name: Not on file   Number of children: Not on file   Years of education: Not on file   Highest education level: Not on file  Occupational History   Occupation: Disabled  Tobacco Use   Smoking status: Former    Packs/day: 1.00    Years: 20.00    Pack years: 20.00    Types: Cigarettes    Quit date: 07/02/1991    Years since quitting: 30.1   Smokeless tobacco: Never  Vaping Use   Vaping Use: Never used  Substance and Sexual Activity   Alcohol use: Yes    Comment: occasionally   Drug use: No  Sexual activity: Never  Other Topics Concern   Not on file  Social History Narrative   Pt lives with her 2 sisters   Social Determinants of Health   Financial Resource Strain: Not on file  Food Insecurity: Not on file  Transportation Needs: Not on file  Physical Activity: Not on file  Stress: Not on file  Social Connections: Not on file  Intimate Partner Violence: Not on file    Family History  Problem Relation Age of Onset   Colon cancer Father    Renal Disease Father    Diabetes Mellitus II Father    Melanoma Paternal Grandmother    Breast cancer Maternal Aunt 40   Anemia Mother    Heart disease Mother    Heart failure Mother    Renal Disease Mother    Congestive Heart Failure Mother    Heart disease Maternal Uncle    Throat cancer Maternal Uncle    Lung cancer Maternal Uncle    Liver disease Maternal Uncle    Heart failure Maternal Uncle    Hearing loss Son 76       Suicide       Review of systems complete and found to be negative unless listed above    PHYSICAL EXAM General: Pleasant, ill appearing Caucasian female, in no acute distress. Sitting upright in PCU bed. No family at bedside. HEENT:  Normocephalic and atraumatic. Neck:  No JVD.  Lungs: Normal work of breathing on room air. Coarse breath sounds throughout. Dry cough with deep inspiration and wheezes anteriorly.  Heart: regular rate with  frequent extra beats. Difficult to hear heart sounds over breath sounds and wheezing.  3/6 systolic murmur.  Radial & DP pulses 2+ bilaterally. Abdomen: Soft, non tender nondistended appearing.  Msk: Normal strength and tone for age. Extremities: Warm and well perfused. No clubbing, cyanosis. No LEE edema.  Neuro: Alert and oriented X 3. Psych:  Answers questions appropriately.   Labs:   Lab Results  Component Value Date   WBC 7.7 08/23/2021   HGB 10.1 (L) 08/23/2021   HCT 30.0 (L) 08/23/2021   MCV 80.4 08/23/2021   PLT 109 (L) 08/23/2021    Recent Labs  Lab 08/18/21 1731 08/21/21 0505 08/23/21 0220  NA 130*   < > 132*  K 3.9   < > 4.0  CL 99   < > 101  CO2 20*   < > 23  BUN 23   < > 25*  CREATININE 1.12*   < > 1.12*  CALCIUM 8.8*   < > 8.0*  PROT 6.7  --   --   BILITOT 0.5  --   --   ALKPHOS 92  --   --   ALT 57*  --   --   AST 37  --   --   GLUCOSE 146*   < > 173*   < > = values in this interval not displayed.    Lab Results  Component Value Date   TROPONINI <0.03 07/25/2017     Lab Results  Component Value Date   CHOL 118 08/21/2021   CHOL 177 12/05/2020   CHOL 186 11/30/2019   Lab Results  Component Value Date   HDL 48 08/21/2021   HDL 59 12/05/2020   HDL 71 11/30/2019   Lab Results  Component Value Date   LDLCALC 58 08/21/2021   LDLCALC 94 12/05/2020   LDLCALC 95 11/30/2019   Lab Results  Component Value Date  TRIG 61 08/21/2021   TRIG 140 12/05/2020   TRIG 114 11/30/2019   Lab Results  Component Value Date   CHOLHDL 2.5 08/21/2021   CHOLHDL 3.0 12/05/2020   CHOLHDL 2.6 11/30/2019   No results found for: LDLDIRECT    Radiology: DG Chest 1 View  Result Date: 08/21/2021 CLINICAL DATA:  Dyspnea. EXAM: CHEST  1 VIEW COMPARISON:  Radiograph 08/20/2021, yesterday.  CT 08/18/2021 FINDINGS: Right chest [name] in place. Stable heart size and mediastinal contours. Vascular congestion and perivascular haziness suspicious for pulmonary edema, not  significantly changed from prior. Suspected small bilateral pleural effusions are similar. Small perifissural right upper lobe opacity. No pneumothorax. IMPRESSION: Findings suspicious for pulmonary edema and small pleural effusions. Perifissural opacity in the right upper lobe may be related to fluid in the fissure or adjacent airspace disease. Electronically Signed   By: Keith Rake M.D.   On: 08/21/2021 22:49   DG Chest 1 View  Result Date: 08/19/2021 CLINICAL DATA:  Dyspnea. EXAM: CHEST  1 VIEW COMPARISON:  August 18, 2021 FINDINGS: There is stable right-sided venous [name]-A-Cath positioning. The heart size and mediastinal contours are within normal limits. Both lungs are clear. Multilevel degenerative changes are seen throughout the thoracic spine. IMPRESSION: No active disease. Electronically Signed   By: Virgina Norfolk M.D.   On: 08/19/2021 21:19   DG Chest 2 View  Result Date: 08/18/2021 CLINICAL DATA:  Shortness of breath, cough, fever, suspected sepsis, multiple myeloma EXAM: CHEST - 2 VIEW COMPARISON:  08/17/2021 FINDINGS: The heart size and mediastinal contours are within normal limits. Both lungs are clear. Disc degenerative disease of the thoracic spine. IMPRESSION: No acute abnormality of the lungs. Electronically Signed   By: Delanna Ahmadi M.D.   On: 08/18/2021 18:13   DG Chest 2 View  Result Date: 08/17/2021 CLINICAL DATA:  Cough and low-grade fever. Multiple myeloma. [name] in place. EXAM: CHEST - 2 VIEW COMPARISON:  06/23/2021 FINDINGS: Right chest wall porta catheter tip overlies the superior vena cava/right atrial junction. Cardiac silhouette and mediastinal contours are within normal limits. The lungs are clear. No pleural effusion or pneumothorax. Mild-to-moderate multilevel degenerative disc changes of the thoracic spine. Cholecystectomy clips. IMPRESSION: No active cardiopulmonary disease. Electronically Signed   By: Yvonne Kendall M.D.   On: 08/17/2021 13:56   CT Angio  Chest PE W and/or Wo Contrast  Result Date: 08/18/2021 CLINICAL DATA:  Pulmonary embolism suspected, high probability. Shortness of breath and fever beginning 3 days ago. EXAM: CT ANGIOGRAPHY CHEST WITH CONTRAST TECHNIQUE: Multidetector CT imaging of the chest was performed using the standard protocol during bolus administration of intravenous contrast. Multiplanar CT image reconstructions and MIPs were obtained to evaluate the vascular anatomy. RADIATION DOSE REDUCTION: This exam was performed according to the departmental dose-optimization program which includes automated exposure control, adjustment of the mA and/or kV according to patient size and/or use of iterative reconstruction technique. CONTRAST:  66m OMNIPAQUE IOHEXOL 350 MG/ML SOLN COMPARISON:  Chest radiography same day. Prior CT angiography 03/17/2020 FINDINGS: Cardiovascular: The heart is enlarged. No pericardial fluid. No visible coronary artery calcification or aortic atherosclerotic calcification. Pulmonary arterial opacification is moderate. No pulmonary emboli are seen. Mediastinum/Nodes: Chronic calcified mediastinal lymph nodes as noted previously. Lungs/Pleura: No pulmonary infiltrate, collapse or effusion. Patient does have some scattered areas of bronchial thickening. No pleural effusion. Upper Abdomen: Fatty change of the liver. No focal lesion is visible. Previous cholecystectomy. Musculoskeletal: Negative Review of the MIP images confirms the above findings. IMPRESSION: No  pulmonary emboli. Mild cardiomegaly as seen previously. Possible bronchitis pattern. No pulmonary consolidation or collapse. No pleural effusion. Chronic calcified mediastinal lymph nodes. Electronically Signed   By: Nelson Chimes M.D.   On: 08/18/2021 20:08   US Abdomen Complete  Result Date: 07/26/2021 CLINICAL DATA:  66 year old female with abnormal LFTs. Prior cholecystectomy. EXAM: ABDOMEN ULTRASOUND COMPLETE COMPARISON:  CT Abdomen and Pelvis 03/05/2021,  10/16/2016. Renal ultrasound 01/24/2020. FINDINGS: Gallbladder: Surgically absent. Common bile duct: Diameter: 5 mm, normal. Liver: Echogenic liver (image 16). No discrete liver lesion. No intrahepatic biliary ductal dilatation. Portal vein is patent on color Doppler imaging with normal direction of blood flow towards the liver. IVC: No abnormality visualized. Pancreas: Visualized portion unremarkable. Spleen: Size and appearance within normal limits. Right Kidney: Length: 10.2 cm. Echogenicity within normal limits. No mass or hydronephrosis visualized. Left Kidney: Length: 9.3 cm. Small complex echogenic midpole lesion (image 60). Was not apparent on the 2021 ultrasound, but probably corresponds to a small 12 mm hypodense heterogeneous area on the CT last year, which was present in 2018. This is 11 x 15 x 17 mm by ultrasound. There is some comet tail artifact in this area by ultrasound now (image 64). No other left renal mass. No hydronephrosis. Preserved left renal cortical echogenicity. Abdominal aorta: No aneurysm visualized. Other findings: None. IMPRESSION: 1. Evidence of hepatic steatosis. Absent gallbladder no evidence of bile duct obstruction. 2. Small up to 1.7 cm complex, mixed echogenic lesion of the left kidney. This might reflect dystrophic calcification of a chronic cyst seen previously by CT, uncertain. But it was not apparent by prior renal ultrasound, most recently in 2021. Recommend dedicated Renal Mass Protocol Abdomen MRI or CT (without and with contrast) to best characterize. Electronically Signed   By: Genevie Ann M.D.   On: 07/26/2021 11:23   DG Chest [name] 1 View  Result Date: 08/20/2021 CLINICAL DATA:  Shortness of breath, cough EXAM: PORTABLE CHEST 1 VIEW COMPARISON:  Previous studies including the examination of 08/19/2021 FINDINGS: Transverse diameter of heart is within normal limits. Tip of right IJ chest [name] is seen in the superior vena cava close to the right atrium. Central pulmonary  vessels are more prominent. There is interval increase in interstitial markings in both parahilar regions and both lower lung fields. There is no focal consolidation. There is minimal blunting of lateral CP angles. There is no pneumothorax. IMPRESSION: Central pulmonary vessels are more prominent suggesting CHF. Increased interstitial markings are seen in both lungs suggesting interstitial pulmonary edema. Less likely possibility would be interstitial pneumonia. Minimal bilateral pleural effusions. Electronically Signed   By: Elmer Picker M.D.   On: 08/20/2021 17:59   ECHOCARDIOGRAM COMPLETE  Result Date: 08/21/2021    ECHOCARDIOGRAM REPORT   Patient Name:   [facility] Date of Exam: 08/21/2021 Medical Rec #:  [id number]        Height:       62.0 in Accession #:    [id number]       Weight:       155.6 lb Date of Birth:  1956/03/07         BSA:          1.718 m Patient Age:    59 years         BP:           138/85 mmHg Patient Gender: F                HR:  91 bpm. Exam Location:  [facility] Procedure: 2D Echo, Cardiac Doppler and Color Doppler Indications:     Chest pain R07.9  History:         Patient has prior history of Echocardiogram examinations, most                  recent 11/16/2019. Cardiomyopathy; Risk Factors:Diabetes and                  Hypertension. Bicuspid AOV.  Sonographer:     Sherrie Sport Referring Phys:  [id number] Idalia L FOUST Diagnosing Phys: Yolonda Kida MD  Sonographer Comments: Suboptimal apical window. IMPRESSIONS  1. Ant/Apical/Septal/High Lateral Hypo.  2. Bicusp Severe AS.  3. Left ventricular ejection fraction, by estimation, is 35 to 40%. The left ventricle has moderately decreased function. The left ventricle demonstrates regional wall motion abnormalities (see scoring diagram/findings for description). Left ventricular  diastolic parameters were normal.  4. Right ventricular systolic function is normal. The right ventricular size is normal.  5. Left atrial size was  mildly dilated.  6. The mitral valve is normal in structure. Mild mitral valve regurgitation.  7. The aortic valve is bicuspid. There is moderate calcification of the aortic valve. There is moderate thickening of the aortic valve. Aortic valve regurgitation is trivial. Severe aortic valve stenosis. FINDINGS  Left Ventricle: Left ventricular ejection fraction, by estimation, is 35 to 40%. The left ventricle has moderately decreased function. The left ventricle demonstrates regional wall motion abnormalities. The left ventricular internal cavity size was normal in size. There is no left ventricular hypertrophy. Left ventricular diastolic parameters were normal. Right Ventricle: The right ventricular size is normal. No increase in right ventricular wall thickness. Right ventricular systolic function is normal. Left Atrium: Left atrial size was mildly dilated. Right Atrium: Right atrial size was normal in size. Pericardium: There is no evidence of pericardial effusion. Mitral Valve: The mitral valve is normal in structure. Mild mitral valve regurgitation. MV peak gradient, 4.7 mmHg. The mean mitral valve gradient is 2.0 mmHg. Tricuspid Valve: The tricuspid valve is normal in structure. Tricuspid valve regurgitation is mild. Aortic Valve: The aortic valve is bicuspid. There is moderate calcification of the aortic valve. There is moderate thickening of the aortic valve. There is moderate aortic valve annular calcification. Aortic valve regurgitation is trivial. Severe aortic stenosis is present. Aortic valve mean gradient measures 14.7 mmHg. Aortic valve peak gradient measures 24.5 mmHg. Aortic valve area, by VTI measures 0.56 cm. Pulmonic Valve: The pulmonic valve was normal in structure. Pulmonic valve regurgitation is trivial. Aorta: The ascending aorta was not well visualized. IAS/Shunts: No atrial level shunt detected by color flow Doppler. Additional Comments: Ant/Apical/Septal/High Lateral Hypo. Bicusp Severe AS.   LEFT VENTRICLE PLAX 2D LVIDd:         4.50 cm     Diastology LVIDs:         3.30 cm     LV e' medial:    5.66 cm/s LV PW:         1.00 cm     LV E/e' medial:  18.4 LV IVS:        0.80 cm     LV e' lateral:   13.50 cm/s LVOT diam:     2.00 cm     LV E/e' lateral: 7.7 LV SV:         28 LV SV Index:   16 LVOT Area:     3.14 cm  LV Volumes (MOD)  LV vol d, MOD A2C: 90.9 ml LV vol d, MOD A4C: 61.0 ml LV vol s, MOD A2C: 63.3 ml LV vol s, MOD A4C: 33.7 ml LV SV MOD A2C:     27.6 ml LV SV MOD A4C:     61.0 ml LV SV MOD BP:      29.5 ml RIGHT VENTRICLE RV Basal diam:  2.50 cm RV S prime:     12.10 cm/s TAPSE (M-mode): 2.6 cm LEFT ATRIUM             Index        RIGHT ATRIUM           Index LA diam:        4.40 cm 2.56 cm/m   RA Area:     16.00 cm LA Vol (A2C):   72.6 ml 42.25 ml/m  RA Volume:   41.80 ml  24.32 ml/m LA Vol (A4C):   52.6 ml 30.61 ml/m LA Biplane Vol: 64.0 ml 37.24 ml/m  AORTIC VALVE AV Area (Vmax):    0.63 cm AV Area (Vmean):   0.51 cm AV Area (VTI):     0.56 cm AV Vmax:           247.67 cm/s AV Vmean:          175.667 cm/s AV VTI:            0.503 m AV Peak Grad:      24.5 mmHg AV Mean Grad:      14.7 mmHg LVOT Vmax:         49.60 cm/s LVOT Vmean:        28.600 cm/s LVOT VTI:          0.090 m LVOT/AV VTI ratio: 0.18  AORTA Ao Root diam: 2.97 cm MITRAL VALVE                TRICUSPID VALVE MV Area (PHT): 4.33 cm     TR Peak grad:   29.2 mmHg MV Area VTI:   0.87 cm     TR Vmax:        270.00 cm/s MV Peak grad:  4.7 mmHg MV Mean grad:  2.0 mmHg     SHUNTS MV Vmax:       1.08 m/s     Systemic VTI:  0.09 m MV Vmean:      69.6 cm/s    Systemic Diam: 2.00 cm MV Decel Time: 175 msec MV E velocity: 104.00 cm/s MV A velocity: 86.50 cm/s MV E/A ratio:  1.20 Dwayne D Callwood MD Electronically signed by Yolonda Kida MD Signature Date/Time: 08/21/2021/4:07:57 PM    Final     ECHO 08/21/2021 LVEF 35-40% with anterior, apical, septal, high lateral hypokinesis, mild MR, severe AS with known bicuspid aortic  valve.  01/24/2021 Summary    1. The left ventricle is normal in size with normal wall thickness.    2. The left ventricular systolic function is normal, LVEF is visually  estimated at > 55%.    3. Moderate to severe paradoxical low-flow low-gradient aortic stenosis.    4. The right ventricle is normal in size, with normal systolic function.   EKG reviewed by me and Dr. Clayborn Bigness: sinus tach, rate 102, LBBB, PVCs. LBBB new compared to EKG in 05/2021  Telemetry reviewed by me: Ventricular bigeminy with rate in the low 100s and peak of 130s   Cardiac Cath 02/21/2016  Coronary angiography:  1. Left main trunk is normal,  divides into LAD and circumflex arteries.  Left main is short.  2. LAD starts as a larger caliber vessel, becomes small as it travels down  to the apex from the mid to distal segment. No focal stenosis is  identified in the LAD. Diagonal branches are free of disease.  3. Circumflex and its branches is angiographically normal.  4. Right coronary artery and its branches is angiographically normal.   CONCLUSION(S):  1. Angiographically unremarkable epicardial coronaries.  2. Preserved LV systolic function by echocardiogram.  3. Small gradient across the aortic valve.  4. False positive Cardiolite stress testing possibly related to a small  distal LAD.   ASSESSMENT AND PLAN:  The patient is a 66 year old female with a past medical history notable for multiple myeloma s/p autologous stem cell transplant and currently on maintenance chemotherapy, chronic thrombocytopenia, bicuspid aortic valve with nonrheumatic severe AS, SVT s/p ablation in 1990s, PVCs, diabetes, chronic pain from spinal stenosis who presented to [facility] ED 08/18/2021 with a fever of 103, cough, sore throat, hoarseness x2 days was found to have sepsis likely 2/2 viral URI.  Cardiology is consulted because of her elevated troponin which peaked at 2,239. She has a newly depressed EF of 35-40% with LV hypokinesis.    #NSTEMI #new LBBB #acute HFrEF (LVEF 35-40%) #Sepsis likely 2/2 viral URI The patient admits to a 3-4 minute episode of chest squeezing and associated dyspnea and diaphoresis that occurred while she was walking form the bathroom back to her hospital bed the evening of 2/20. She has some exertional dyspnea and weakness at baseline thought to be due to her known severe AS, however this chest squeezing and associated symptoms are new and with her peak troponin of 2239 it's concerning for underlying coronary disease.  -Agree with current therapy for sepsis 2/2 metapneumovirus versus -S/p aspirin 324 mg, continue 81 mg aspirin daily. -continue heparin drip for 72 hours per Dr. Clayborn Bigness (end 0100 on 2/24)  -On bisoprolol 5 mg at home.  Continue metoprolol tartrate 12.38m BID - this seems to have helped her HR and reduced PVC burden somewhat in addition to replenishing her Mag.  -s/p lasix IV 449mx 2 and net negative 2.6L in the last 24 hours.  Diuresis held due to bump in creatinine.  Will likely transition to PO lasix 4060maily at discharge. -adding Imdur 20m98m an antianginal.  -continue monitoring on telemetry while inpatient -Further ischemic workup is needed but will be based on her clinical course, likely on an outpatient basis with after she recovers from this acute infection per Dr. CallClayborn Bigness#Multiple myeloma s/p stem cell transplant, on maintenance chemo #Chronic thrombocytopenia Platelets stable.  We will continue close monitoring of her platelets.   #Bicuspid aortic valve with nonrheumatic severe AS - will need outpatient follow up with her regular cardi[facility] at [facility] discussed and created with Dr. DwayLujean Amel he is in agreement.  Signed: LilyTristan SchroederA-C 08/23/2021, 1:16 PM [facility]

## 2021-08-23 NOTE — Progress Notes (Signed)
Pt noted to be moderately perspiring, reports subjective feeling of fever breaking. Temperature now 99.3 F orally. Will defer cooling blanket while afebrile, but reinitiate per order if refevers.

## 2021-08-23 NOTE — Assessment & Plan Note (Signed)
Acute on chronic thrombocytopenia, likely due to acute infection.  Improving, platelet at 109 today -Continue to monitor -Oncology on board

## 2021-08-23 NOTE — Progress Notes (Signed)
ID Sarah [name] is a 66 y.o. female with a history of multiple myeloma status post  2 auto-HCT. 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 Presents to the hospital with fever for 3 days duration. Also has a cough, chest tightness and shortness of breath Patient lives with her niece and her 31-year-old daughter and sister.  The 10-year-old child had cough and some respiratory illness prior to the patient getting sick.   was recently hospitalized I 06/19/2021   for fever with sore gums after dental procedure Was treated like a UTI but she was not symptomatic 05/01/2021 patient sister had flu and patient became sick and received.tamiflu   03/05/2021  community-acquired pneumonia     4/22 cough and otitis media treated for 7 days with Augmentin She is followed by Dr. Tasia Catchings.  She saw Infectious Disease physician at [facility] on 08/08/2021 and they recommended IVIG monthly transfusions  On 08/09/2021 she received transforaminal epidural steroid injection for lumbar radiculitis, stenosis with neurogenic claudication  Subjective Pt still has some chest tightness eventhough a little better than before. Has some retrosternal cp Has blood tinged sputum fever   No pain abdomen or diarrhea No dysuria or hematuria   O/e awake and alert No distress at rest BP (!) 93/53 (BP Location: Left Arm)    Pulse 73    Temp 98.6 F (37 C) (Oral)    Resp 16    Ht 5' 2"  (1.575 m)    Wt 70.6 kg    SpO2 92%    BMI 28.47 kg/m    Chest b/l air entry- few rhonchi and crepts left side Hs - systolic murmur PORT in place And soft SKIn N CNS non focal  Labs CBC Latest Ref Rng & Units 08/23/2021 08/22/2021 08/21/2021  WBC 4.0 - 10.5 K/uL 7.7 9.1 6.5  Hemoglobin 12.0 - 15.0 g/dL 10.1(L) 10.4(L) 10.4(L)  Hematocrit 36.0 - 46.0 % 30.0(L) 30.9(L) 31.8(L)  Platelets 150 - 400  K/uL 109(L) 102(L) 89(L)    CMP Latest Ref Rng & Units 08/23/2021 08/23/2021 08/22/2021  Glucose 70 - 99 mg/dL 173(H) 187(H) 151(H)  BUN 8 - 23 mg/dL 25(H) 26(H) 32(H)  Creatinine 0.44 - 1.00 mg/dL 1.12(H) 1.06(H) 1.29(H)  Sodium 135 - 145 mmol/L 132(L) 134(L) 134(L)  Potassium 3.5 - 5.1 mmol/L 4.0 3.8 3.6  Chloride 98 - 111 mmol/L 101 101 100  CO2 22 - 32 mmol/L 23 25 23   Calcium 8.9 - 10.3 mg/dL 8.0(L) 8.1(L) 8.1(L)  Total Protein 6.5 - 8.1 g/dL - - -  Total Bilirubin 0.3 - 1.2 mg/dL - - -  Alkaline Phos 38 - 126 U/L - - -  AST 15 - 41 U/L - - -  ALT 0 - 44 U/L - - -    MICRO 2/18 BC - NG 2/19 resp pathogen positive for metapneumovirus 2/19 Sputum culture NG MRSa nares neg  CXR   B/l increased interstitial markings  Impression/recommendation  Multiple myeloma - s/p 2 autologous HCT- last one on 07/21/19 . 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

## 2021-08-23 NOTE — Progress Notes (Addendum)
ANTICOAGULATION CONSULT NOTE  Pharmacy Consult for heparin infusion Indication: ACS/STEMI  Allergies  Allergen Reactions   Oxycodone-Acetaminophen Anaphylaxis    Swelling and rash   Celebrex [Celecoxib] Diarrhea   Codeine    Plerixafor     In 2016 during ASCT collection patient developed fever to 103.25F and required hospitalization   Benadryl [Diphenhydramine] Palpitations   Morphine Itching and Rash   Ondansetron Diarrhea   Tylenol [Acetaminophen] Itching and Rash    Patient Measurements: Height: 5' 2"  (157.5 cm) Weight: 70.6 kg (155 lb 10.3 oz) IBW/kg (Calculated) : 50.1 Heparin Dosing Weight: 65kg  Vital Signs: Temp: 100.5 F (38.1 C) (02/23 0016) Temp Source: Oral (02/23 0016) BP: 118/74 (02/23 0104) Pulse Rate: 81 (02/23 0059)  Labs: Recent Labs    08/21/21 0505 08/21/21 0725 08/22/21 0533 08/22/21 0857 08/22/21 1027 08/22/21 1519 08/22/21 1710 08/23/21 0056 08/23/21 0220  HGB 10.4*  --  10.4*  --   --   --   --   --  10.1*  HCT 31.8*  --  30.9*  --   --   --   --   --  30.0*  PLT 89*  --  102*  --   --   --   --   --  109*  APTT 51*  --   --   --   --   --   --   --   --   LABPROT 14.5  --   --   --   --   --   --   --   --   INR 1.1  --   --   --   --   --   --   --   --   HEPARINUNFRC 0.40   < > 0.29*  --   --  >1.10* >1.10*  --  >1.10*  CREATININE 1.08*  --  1.29*  --   --   --   --  1.06* 1.12*  TROPONINIHS 2,239*   < >  --  1,176* 1,194*  --   --   --  915*   < > = values in this interval not displayed.     Estimated Creatinine Clearance: 46.7 mL/min (A) (by C-G formula based on SCr of 1.12 mg/dL (H)).   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   Due to [place]   Cardiomyopathy, idiopathic ([place])  08/21/2017   Overview:  Septal and apical hypokinesis with ef 40%   CHF (congestive heart failure) (HCC)    CKD (chronic kidney disease) stage 3, GFR 30-59 ml/min (HCC)    Depression    Diabetes mellitus (HCC)    Dizziness    Fatty liver    Frequent falls    GERD (gastroesophageal reflux disease)    Gout    Heart murmur    History of blood transfusion    History of bone marrow transplant ([place])    History of uterine fibroid    Hx of cardiac catheterization 06/05/2016   Overview:  Normal coronaries 2017   Hypertension    Hypomagnesemia    IDA (iron deficiency anemia)    Multiple myeloma ([place])    Personal history of chemotherapy  Renal cyst    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HCC) 06/05/2016   Overview:  Sp ablation 1999    Medications: Pt ordered Enoxaparin 40 mg q24h for DVT proph, last dose 2/20 @ 0917 Heparin Dosing Weight: 65kg  Assessment: Pt is 66 yo female with PMH multiple myeloma, (s/p autologous stem cell transplant on chemotherapy), asthma, DM, and aortic valve stenosis. Presented to ED on 2/18 with SOB. Then, on 2/20, found to have elevated troponins (pk 2,143). EKG showed LBBB, ECHO with depressed EF with regional wall motion abnormalities consistent with NSTEMI.  CBC stable. Hgb 9.7 > 10.4. Plt improved from 68>102.   2/22 update: Heparin discontinued overnight. Per RN, patient coughing overnight with bloody sputum, however actual content of blood in sputum was minimal. Plan is to continue heparin infusion for 72 hours.  Goal of Therapy:  Heparin level 0.3-0.7 units/ml Monitor platelets by anticoagulation protocol: Yes  2/21 0505 HL 0.40, therapeutic x 1, Plt 89 2/21 1230 HL 0.34, therapeutic x 2,  2/22 0200 --heparin discontinued-- 2/22 1519 HL >1.1  <--(will get STAT repeat, since doesn't fit the expected trend and no dose change) 2/22 1719 HL >1.1 <-- (despite repeat; will hold x1 hr, then resume lower) 800 > 650 un/hr 2/23 0220 HL >1.1, supratherapeutic   Plan:   Careers information officer.  RN paused heparin x 2 minutes prior to drawing lab from chest port. Hold heparin infusion x 1 hr  Restart heparin infusion at 500 unit/hr Recheck heparin level in 6 hour after restart, will request to have next HL drawn from alternate location to confirm chest port is not affecting levels. Daily CBC, HL. Monitor for any new or worsening bleeding.  Renda Rolls, PharmD, [facility] 08/23/2021 3:05 AM

## 2021-08-23 NOTE — Progress Notes (Signed)
Check EBV and CMV  Hematology/Oncology Progress note [phone] [phone]     Patient Care Team: Glean Hess, MD as PCP - General (Internal Medicine) Jodell Cipro, MD as Referring Physician (Pain Medicine) Jennye Boroughs, MD as Consulting Physician (Cardiology) Earlie Server, MD as Consulting Physician (Oncology)   Name of the patient: Sarah [name]  [id number]  [birth year]  Date of visit: 08/23/21   INTERVAL HISTORY-  + Cough, she still has intermittent chest pain, improved.  Off oxygen today. Febrile, metapneumovirus currently on IV ceftriaxone and Z-Pak for superimposed bacterial pneumonia. NSTEMI, seen by cardiology.  On heparin drip.   Allergies  Allergen Reactions   Oxycodone-Acetaminophen Anaphylaxis    Swelling and rash   Celebrex [Celecoxib] Diarrhea   Codeine    Plerixafor     In 2016 during ASCT collection patient developed fever to 103.47F and required hospitalization   Benadryl [Diphenhydramine] Palpitations   Morphine Itching and Rash   Ondansetron Diarrhea   Tylenol [Acetaminophen] Itching and Rash    Patient Active Problem List   Diagnosis Date Noted   Acute HFrEF (heart failure with reduced ejection fraction) ([place]) 08/22/2021   NSTEMI (non-ST elevated myocardial infarction) ([place]) 08/21/2021   Low immunoglobulin level    Bronchitis    Fever    Acute gingivitis 06/24/2021   URI (upper respiratory infection) 06/23/2021   Chronic diarrhea 06/23/2021   Multiple myeloma in remission ([place]) 05/14/2021   Leg swelling 04/02/2021   Neuropathy 04/02/2021   Fever in adult 03/05/2021   Frequent PVCs 12/04/2020   Diarrhea 10/25/2020   Encounter for antineoplastic chemotherapy 08/31/2020   Coronary artery disease involving native coronary artery of native heart 04/26/2020   Yeast vaginitis 03/18/2020   Hypokalemia 03/11/2020   UTI (urinary tract infection) 03/03/2020   Dyspnea 03/02/2020   Physical deconditioning 02/07/2020    Impaired nutrition 02/07/2020   Sepsis ([place]) 11/13/2019   Encounter for antineoplastic immunotherapy 10/03/2019   Hypocalcemia 08/28/2019   Chronic nausea 12/17/2018   Chemotherapy-induced neuropathy ([place]) 12/17/2018   Hyperlipidemia associated with type 2 diabetes mellitus ([place]) 11/25/2018   Anemia 10/21/2018   Hypomagnesemia 08/20/2018   Goals of care, counseling/discussion 08/20/2018   B12 deficiency 08/20/2018   Thrombocytopenia ([place]) 02/09/2018   Benign essential HTN 08/21/2017   Carotid artery stenosis, asymptomatic, bilateral 08/06/2017   Thyroid nodule 08/06/2017   Iron deficiency anemia due to chronic blood loss 05/13/2017   Spinal stenosis of lumbar region with neurogenic claudication 04/09/2017   Long term prescription opiate use 09/07/2016   Chronic pain syndrome 07/08/2016   Pain medication agreement signed 07/08/2016   Spondylosis of lumbar region without myelopathy or radiculopathy 07/08/2016   Severe aortic stenosis 06/05/2016   Environmental and seasonal allergies 04/19/2016   Insomnia 04/19/2016   Asthma 04/19/2016   Aortic regurgitation 04/19/2016   Type II diabetes mellitus with complication ([place]) 69/48/5462   Depression, major, single episode, in partial remission ([place]) 04/12/2016   Irritable bowel syndrome (IBS) 04/12/2016   Hepatic steatosis 04/12/2016   Multiple myeloma not having achieved remission ([place]) 04/02/2016   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[place]) 07/06/2015   Stem cell transplant candidate 05/16/2015     Past Medical History:  Diagnosis Date   Abnormal stress test 02/14/2016   Overview:  Added automatically from request for surgery [id number]   Anemia    Anxiety    Arthritis    Bicuspid aortic valve    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   Due  to Zometa   Cardiomyopathy, idiopathic ([place]) 08/21/2017   Overview:  Septal and apical hypokinesis with ef 40%   CHF (congestive heart failure) (HCC)    CKD  (chronic kidney disease) stage 3, GFR 30-59 ml/min (HCC)    Depression    Diabetes mellitus (HCC)    Dizziness    Fatty liver    Frequent falls    GERD (gastroesophageal reflux disease)    Gout    Heart murmur    History of blood transfusion    History of bone marrow transplant ([place])    History of uterine fibroid    Hx of cardiac catheterization 06/05/2016   Overview:  Normal coronaries 2017   Hypertension    Hypomagnesemia    IDA (iron deficiency anemia)    Multiple myeloma ([place])    Personal history of chemotherapy    Renal cyst    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[place]) 06/05/2016   Overview:  Sp ablation 1999     Past Surgical History:  Procedure Laterality Date   ABDOMINAL HYSTERECTOMY     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   CARDIAC ELECTROPHYSIOLOGY MAPPING AND ABLATION     CARPAL TUNNEL RELEASE Bilateral    CHOLECYSTECTOMY  2008   COLONOSCOPY WITH PROPOFOL N/A 05/07/2017   Procedure: COLONOSCOPY WITH PROPOFOL;  Surgeon: [name], MD;  Location: [facility] ENDOSCOPY;  Service: Gastroenterology;  Laterality: N/A;   COLONOSCOPY WITH PROPOFOL N/A 12/19/2020   Procedure: COLONOSCOPY WITH PROPOFOL;  Surgeon: [name], MD;  Location: [facility] ENDOSCOPY;  Service: Gastroenterology;  Laterality: N/A;   ESOPHAGOGASTRODUODENOSCOPY (EGD) WITH PROPOFOL N/A 05/07/2017   Procedure: ESOPHAGOGASTRODUODENOSCOPY (EGD) WITH PROPOFOL;  Surgeon: [name], MD;  Location: [facility] ENDOSCOPY;  Service: Gastroenterology;  Laterality: N/A;   FOOT SURGERY Bilateral    INCONTINENCE SURGERY  2009   INTERSTIM IMPLANT PLACEMENT     other     over active bladder   OTHER SURGICAL HISTORY     bladder stimulator    PARTIAL HYSTERECTOMY  03/1996   fibroids   PORTA CATH INSERTION N/A 03/10/2019   Procedure: PORTA CATH INSERTION;  Surgeon: Algernon Huxley, MD;  Location: [facility];  Service: Cardiovascular;  Laterality: N/A;   TONSILLECTOMY  2007    Social History   Socioeconomic History   Marital status:  Widowed    Spouse name: Not on file   Number of children: Not on file   Years of education: Not on file   Highest education level: Not on file  Occupational History   Occupation: Disabled  Tobacco Use   Smoking status: Former    Packs/day: 1.00    Years: 20.00    Pack years: 20.00    Types: Cigarettes    Quit date: 07/02/1991    Years since quitting: 30.1   Smokeless tobacco: Never  Vaping Use   Vaping Use: Never used  Substance and Sexual Activity   Alcohol use: Yes    Comment: occasionally   Drug use: No   Sexual activity: Never  Other Topics Concern   Not on file  Social History Narrative   Pt lives with her 2 sisters   Social Determinants of Health   Financial Resource Strain: Not on file  Food Insecurity: Not on file  Transportation Needs: Not on file  Physical Activity: Not on file  Stress: Not on file  Social Connections: Not on file  Intimate Partner Violence: Not on file     Family History  Problem Relation Age of  Onset   Colon cancer Father    Renal Disease Father    Diabetes Mellitus II Father    Melanoma Paternal Grandmother    Breast cancer Maternal Aunt 65   Anemia Mother    Heart disease Mother    Heart failure Mother    Renal Disease Mother    Congestive Heart Failure Mother    Heart disease Maternal Uncle    Throat cancer Maternal Uncle    Lung cancer Maternal Uncle    Liver disease Maternal Uncle    Heart failure Maternal Uncle    Hearing loss Son 39       Suicide      Current Facility-Administered Medications:    acyclovir (ZOVIRAX) 200 MG capsule 400 mg, 400 mg, Oral, BID, Doristine Mango L, MD, 400 mg at 08/23/21 0946   albuterol (PROVENTIL) (2.5 MG/3ML) 0.083% nebulizer solution 2.5 mg, 2.5 mg, Nebulization, Q4H PRN, Foust, Katy L, NP, 2.5 mg at 08/20/21 1632   [COMPLETED] azithromycin (ZITHROMAX) tablet 500 mg, 500 mg, Oral, Daily, 500 mg at 08/22/21 1350 **FOLLOWED BY** azithromycin (ZITHROMAX) tablet 250 mg, 250 mg, Oral, Daily,  Lorella Nimrod, MD, 250 mg at 08/23/21 0946   benzonatate (TESSALON) capsule 200 mg, 200 mg, Oral, TID PRN, Foust, Katy L, NP, 200 mg at 08/23/21 1725   cefTRIAXone (ROCEPHIN) 2 g in sodium chloride 0.9 % 100 mL IVPB, 2 g, Intravenous, Q24H, Amin, Soundra Pilon, MD, Last Rate: 200 mL/hr at 08/23/21 1224, 2 g at 08/23/21 1224   Chlorhexidine Gluconate Cloth 2 % PADS 6 each, 6 each, Topical, Daily, Richarda Osmond, MD, 6 each at 08/23/21 0947   dextromethorphan-guaiFENesin ([place] DM) 30-600 MG per 12 hr tablet 1 tablet, 1 tablet, Oral, BID, Imagene Sheller S, DO, 1 tablet at 08/23/21 4742   diclofenac Sodium (VOLTAREN) 1 % topical gel 2 g, 2 g, Topical, QID PRN, Richarda Osmond, MD   diphenhydrAMINE (BENADRYL) capsule 50 mg, 50 mg, Oral, Once PRN, Earlie Server, MD   FLUoxetine (PROZAC) capsule 40 mg, 40 mg, Oral, Daily, Richarda Osmond, MD, 40 mg at 08/23/21 0946   fluticasone (FLONASE) 50 MCG/ACT nasal spray 1 spray, 1 spray, Each Nare, Daily, Lorella Nimrod, MD, 1 spray at 08/23/21 0947   heparin ADULT infusion 100 units/mL (25000 units/255m), 900 Units/hr, Intravenous, Continuous, GDallie Piles RPH, Last Rate: 7 mL/hr at 08/23/21 1220, 700 Units/hr at 08/23/21 1220   insulin aspart (novoLOG) injection 0-15 Units, 0-15 Units, Subcutaneous, TID WC, DAthena Masse MD, 3 Units at 08/23/21 1225   insulin aspart (novoLOG) injection 3 Units, 3 Units, Subcutaneous, TID WC, ALorella Nimrod MD, 3 Units at 08/23/21 1725   insulin glargine-yfgn (SEMGLEE) injection 5 Units, 5 Units, Subcutaneous, BID, ALorella Nimrod MD, 5 Units at 08/23/21 0948   ipratropium-albuterol (DUONEB) 0.5-2.5 (3) MG/3ML nebulizer solution 3 mL, 3 mL, Nebulization, Q6H, Anwar, Shayan S, DO, 3 mL at 08/23/21 1330   isosorbide mononitrate (IMDUR) 24 hr tablet 30 mg, 30 mg, Oral, Daily, Tang, Lily Michelle, PA-C, 30 mg at 08/23/21 1725   loperamide (IMODIUM) capsule 2 mg, 2 mg, Oral, BID PRN, ARicharda Osmond MD, 2 mg at 08/19/21  2029   magnesium chloride (SLOW-MAG) 64 MG SR tablet 64 mg, 1 tablet, Oral, BID, YEarlie Server MD, 64 mg at 08/23/21 0947   MEDLINE mouth rinse, 15 mL, Mouth Rinse, BID, ALorella Nimrod MD, 15 mL at 08/23/21 0948   menthol-cetylpyridinium (CEPACOL) lozenge 3 mg, 1 lozenge, Oral, PRN, DJudd Gaudier  V, MD, 3 mg at 08/19/21 1708   metoprolol tartrate (LOPRESSOR) tablet 12.5 mg, 12.5 mg, Oral, BID, Tang, Lily Michelle, PA-C, 12.5 mg at 08/23/21 0951   montelukast (SINGULAIR) tablet 10 mg, 10 mg, Oral, Daily PRN, Richarda Osmond, MD   multivitamin with minerals tablet 1 tablet, 1 tablet, Oral, Daily, Imagene Sheller S, DO, 1 tablet at 08/23/21 0946   nitroGLYCERIN (NITROSTAT) SL tablet 0.4 mg, 0.4 mg, Sublingual, Q5 min PRN, Foust, Katy L, NP, 0.4 mg at 08/23/21 0055   ondansetron (ZOFRAN) tablet 4 mg, 4 mg, Oral, Q6H PRN **OR** ondansetron (ZOFRAN) injection 4 mg, 4 mg, Intravenous, Q6H PRN, Athena Masse, MD, 4 mg at 08/22/21 1706   pantoprazole (PROTONIX) EC tablet 80 mg, 80 mg, Oral, Daily, Anwar, Shayan S, DO, 80 mg at 08/23/21 0946   promethazine (PHENERGAN) tablet 12.5 mg, 12.5 mg, Oral, Q6H PRN, Richarda Osmond, MD, 12.5 mg at 08/22/21 1757   sodium chloride flush (NS) 0.9 % injection 10-40 mL, 10-40 mL, Intracatheter, PRN, Richarda Osmond, MD   tiZANidine (ZANAFLEX) tablet 2 mg, 2 mg, Oral, Q6H PRN, Richarda Osmond, MD, 2 mg at 08/23/21 1223   traMADol (ULTRAM) tablet 50 mg, 50 mg, Oral, Q12H PRN, Lorella Nimrod, MD, 50 mg at 08/23/21 1223   traZODone (DESYREL) tablet 100 mg, 100 mg, Oral, QHS, Anwar, Shayan S, DO, 100 mg at 08/22/21 2130  Facility-Administered Medications Ordered in Other Encounters:    sodium chloride flush (NS) 0.9 % injection 10 mL, 10 mL, Intravenous, PRN, Earlie Server, MD, 10 mL at 04/02/21 0904   Physical exam:  Vitals:   08/23/21 0812 08/23/21 0948 08/23/21 1700 08/23/21 1933  BP:  125/67 120/61 112/64  Pulse:  97 (!) 106 (!) 106  Resp:  20 20 18   Temp:   98.5 F (36.9 C) (!) 103.3 F (39.6 C) (!) 102.7 F (39.3 C)  TempSrc:  Oral Oral   SpO2: 94% 91% 93% 93%  Weight:      Height:       Physical Exam Constitutional:      General: She is not in acute distress.    Appearance: She is not diaphoretic.  HENT:     Head: Normocephalic and atraumatic.     Nose: Nose normal.     Mouth/Throat:     Pharynx: No oropharyngeal exudate.  Eyes:     General: No scleral icterus.    Pupils: Pupils are equal, round, and reactive to light.  Cardiovascular:     Rate and Rhythm: Normal rate and regular rhythm.     Heart sounds: No murmur heard. Pulmonary:     Effort: Pulmonary effort is normal. No respiratory distress.     Breath sounds: Rhonchi present.  Abdominal:     General: There is no distension.     Palpations: Abdomen is soft.     Tenderness: There is no abdominal tenderness.  Musculoskeletal:        General: Normal range of motion.     Cervical back: Normal range of motion and neck supple.  Skin:    General: Skin is warm and dry.     Findings: No erythema.  Neurological:     Mental Status: She is alert and oriented to person, place, and time.     Cranial Nerves: No cranial nerve deficit.     Motor: No abnormal muscle tone.     Coordination: Coordination normal.  Psychiatric:        Mood  and Affect: Affect normal.       CMP Latest Ref Rng & Units 08/23/2021  Glucose 70 - 99 mg/dL 173(H)  BUN 8 - 23 mg/dL 25(H)  Creatinine 0.44 - 1.00 mg/dL 1.12(H)  Sodium 135 - 145 mmol/L 132(L)  Potassium 3.5 - 5.1 mmol/L 4.0  Chloride 98 - 111 mmol/L 101  CO2 22 - 32 mmol/L 23  Calcium 8.9 - 10.3 mg/dL 8.0(L)  Total Protein 6.5 - 8.1 g/dL -  Total Bilirubin 0.3 - 1.2 mg/dL -  Alkaline Phos 38 - 126 U/L -  AST 15 - 41 U/L -  ALT 0 - 44 U/L -   CBC Latest Ref Rng & Units 08/23/2021  WBC 4.0 - 10.5 K/uL 7.7  Hemoglobin 12.0 - 15.0 g/dL 10.1(L)  Hematocrit 36.0 - 46.0 % 30.0(L)  Platelets 150 - 400 K/uL 109(L)    RADIOGRAPHIC  STUDIES: I have personally reviewed the radiological images as listed and agreed with the findings in the report. DG Chest 1 View  Result Date: 08/21/2021 CLINICAL DATA:  Dyspnea. EXAM: CHEST  1 VIEW COMPARISON:  Radiograph 08/20/2021, yesterday.  CT 08/18/2021 FINDINGS: Right chest port in place. Stable heart size and mediastinal contours. Vascular congestion and perivascular haziness suspicious for pulmonary edema, not significantly changed from prior. Suspected small bilateral pleural effusions are similar. Small perifissural right upper lobe opacity. No pneumothorax. IMPRESSION: Findings suspicious for pulmonary edema and small pleural effusions. Perifissural opacity in the right upper lobe may be related to fluid in the fissure or adjacent airspace disease. Electronically Signed   By: Keith Rake M.D.   On: 08/21/2021 22:49   DG Chest 1 View  Result Date: 08/19/2021 CLINICAL DATA:  Dyspnea. EXAM: CHEST  1 VIEW COMPARISON:  August 18, 2021 FINDINGS: There is stable right-sided venous Port-A-Cath positioning. The heart size and mediastinal contours are within normal limits. Both lungs are clear. Multilevel degenerative changes are seen throughout the thoracic spine. IMPRESSION: No active disease. Electronically Signed   By: Virgina Norfolk M.D.   On: 08/19/2021 21:19   DG Chest 2 View  Result Date: 08/18/2021 CLINICAL DATA:  Shortness of breath, cough, fever, suspected sepsis, multiple myeloma EXAM: CHEST - 2 VIEW COMPARISON:  08/17/2021 FINDINGS: The heart size and mediastinal contours are within normal limits. Both lungs are clear. Disc degenerative disease of the thoracic spine. IMPRESSION: No acute abnormality of the lungs. Electronically Signed   By: Delanna Ahmadi M.D.   On: 08/18/2021 18:13   DG Chest 2 View  Result Date: 08/17/2021 CLINICAL DATA:  Cough and low-grade fever. Multiple myeloma. Port in place. EXAM: CHEST - 2 VIEW COMPARISON:  06/23/2021 FINDINGS: Right chest wall  porta catheter tip overlies the superior vena cava/right atrial junction. Cardiac silhouette and mediastinal contours are within normal limits. The lungs are clear. No pleural effusion or pneumothorax. Mild-to-moderate multilevel degenerative disc changes of the thoracic spine. Cholecystectomy clips. IMPRESSION: No active cardiopulmonary disease. Electronically Signed   By: Yvonne Kendall M.D.   On: 08/17/2021 13:56   CT Angio Chest PE W and/or Wo Contrast  Result Date: 08/18/2021 CLINICAL DATA:  Pulmonary embolism suspected, high probability. Shortness of breath and fever beginning 3 days ago. EXAM: CT ANGIOGRAPHY CHEST WITH CONTRAST TECHNIQUE: Multidetector CT imaging of the chest was performed using the standard protocol during bolus administration of intravenous contrast. Multiplanar CT image reconstructions and MIPs were obtained to evaluate the vascular anatomy. RADIATION DOSE REDUCTION: This exam was performed according to the departmental dose-optimization  program which includes automated exposure control, adjustment of the mA and/or kV according to patient size and/or use of iterative reconstruction technique. CONTRAST:  2m OMNIPAQUE IOHEXOL 350 MG/ML SOLN COMPARISON:  Chest radiography same day. Prior CT angiography 03/17/2020 FINDINGS: Cardiovascular: The heart is enlarged. No pericardial fluid. No visible coronary artery calcification or aortic atherosclerotic calcification. Pulmonary arterial opacification is moderate. No pulmonary emboli are seen. Mediastinum/Nodes: Chronic calcified mediastinal lymph nodes as noted previously. Lungs/Pleura: No pulmonary infiltrate, collapse or effusion. Patient does have some scattered areas of bronchial thickening. No pleural effusion. Upper Abdomen: Fatty change of the liver. No focal lesion is visible. Previous cholecystectomy. Musculoskeletal: Negative Review of the MIP images confirms the above findings. IMPRESSION: No pulmonary emboli. Mild cardiomegaly as  seen previously. Possible bronchitis pattern. No pulmonary consolidation or collapse. No pleural effusion. Chronic calcified mediastinal lymph nodes. Electronically Signed   By: MNelson ChimesM.D.   On: 08/18/2021 20:08   UKoreaAbdomen Complete  Result Date: 07/26/2021 CLINICAL DATA:  66year old female with abnormal LFTs. Prior cholecystectomy. EXAM: ABDOMEN ULTRASOUND COMPLETE COMPARISON:  CT Abdomen and Pelvis 03/05/2021, 10/16/2016. Renal ultrasound 01/24/2020. FINDINGS: Gallbladder: Surgically absent. Common bile duct: Diameter: 5 mm, normal. Liver: Echogenic liver (image 16). No discrete liver lesion. No intrahepatic biliary ductal dilatation. Portal vein is patent on color Doppler imaging with normal direction of blood flow towards the liver. IVC: No abnormality visualized. Pancreas: Visualized portion unremarkable. Spleen: Size and appearance within normal limits. Right Kidney: Length: 10.2 cm. Echogenicity within normal limits. No mass or hydronephrosis visualized. Left Kidney: Length: 9.3 cm. Small complex echogenic midpole lesion (image 60). Was not apparent on the 2021 ultrasound, but probably corresponds to a small 12 mm hypodense heterogeneous area on the CT last year, which was present in 2018. This is 11 x 15 x 17 mm by ultrasound. There is some comet tail artifact in this area by ultrasound now (image 64). No other left renal mass. No hydronephrosis. Preserved left renal cortical echogenicity. Abdominal aorta: No aneurysm visualized. Other findings: None. IMPRESSION: 1. Evidence of hepatic steatosis. Absent gallbladder no evidence of bile duct obstruction. 2. Small up to 1.7 cm complex, mixed echogenic lesion of the left kidney. This might reflect dystrophic calcification of a chronic cyst seen previously by CT, uncertain. But it was not apparent by prior renal ultrasound, most recently in 2021. Recommend dedicated Renal Mass Protocol Abdomen MRI or CT (without and with contrast) to best  characterize. Electronically Signed   By: HGenevie AnnM.D.   On: 07/26/2021 11:23   DG Chest Port 1 View  Result Date: 08/20/2021 CLINICAL DATA:  Shortness of breath, cough EXAM: PORTABLE CHEST 1 VIEW COMPARISON:  Previous studies including the examination of 08/19/2021 FINDINGS: Transverse diameter of heart is within normal limits. Tip of right IJ chest port is seen in the superior vena cava close to the right atrium. Central pulmonary vessels are more prominent. There is interval increase in interstitial markings in both parahilar regions and both lower lung fields. There is no focal consolidation. There is minimal blunting of lateral CP angles. There is no pneumothorax. IMPRESSION: Central pulmonary vessels are more prominent suggesting CHF. Increased interstitial markings are seen in both lungs suggesting interstitial pulmonary edema. Less likely possibility would be interstitial pneumonia. Minimal bilateral pleural effusions. Electronically Signed   By: PElmer PickerM.D.   On: 08/20/2021 17:59   ECHOCARDIOGRAM COMPLETE  Result Date: 08/21/2021    ECHOCARDIOGRAM REPORT   [facility]  [place] Date of Exam: 08/21/2021 Medical Rec #:  [id number]        Height:       62.0 in Accession #:    [id number]       Weight:       155.6 lb Date of Birth:  Feb 20, 1956         BSA:          1.718 m Patient Age:    66 years         BP:           138/85 mmHg Patient Gender: F                HR:           91 bpm. Exam Location:  [facility] Procedure: 2D Echo, Cardiac Doppler and Color Doppler Indications:     Chest pain R07.9  History:         Patient has prior history of Echocardiogram examinations, most                  recent 11/16/2019. Cardiomyopathy; Risk Factors:Diabetes and                  Hypertension. Bicuspid AOV.  Sonographer:     Sherrie Sport Referring Phys:  [id number] [place] L FOUST Diagnosing Phys: Yolonda Kida MD  Sonographer Comments: Suboptimal apical window. IMPRESSIONS  1. Ant/Apical/Septal/High  Lateral Hypo.  2. Bicusp Severe AS.  3. Left ventricular ejection fraction, by estimation, is 35 to 40%. The left ventricle has moderately decreased function. The left ventricle demonstrates regional wall motion abnormalities (see scoring diagram/findings for description). Left ventricular  diastolic parameters were normal.  4. Right ventricular systolic function is normal. The right ventricular size is normal.  5. Left atrial size was mildly dilated.  6. The mitral valve is normal in structure. Mild mitral valve regurgitation.  7. The aortic valve is bicuspid. There is moderate calcification of the aortic valve. There is moderate thickening of the aortic valve. Aortic valve regurgitation is trivial. Severe aortic valve stenosis. FINDINGS  Left Ventricle: Left ventricular ejection fraction, by estimation, is 35 to 40%. The left ventricle has moderately decreased function. The left ventricle demonstrates regional wall motion abnormalities. The left ventricular internal cavity size was normal in size. There is no left ventricular hypertrophy. Left ventricular diastolic parameters were normal. Right Ventricle: The right ventricular size is normal. No increase in right ventricular wall thickness. Right ventricular systolic function is normal. Left Atrium: Left atrial size was mildly dilated. Right Atrium: Right atrial size was normal in size. Pericardium: There is no evidence of pericardial effusion. Mitral Valve: The mitral valve is normal in structure. Mild mitral valve regurgitation. MV peak gradient, 4.7 mmHg. The mean mitral valve gradient is 2.0 mmHg. Tricuspid Valve: The tricuspid valve is normal in structure. Tricuspid valve regurgitation is mild. Aortic Valve: The aortic valve is bicuspid. There is moderate calcification of the aortic valve. There is moderate thickening of the aortic valve. There is moderate aortic valve annular calcification. Aortic valve regurgitation is trivial. Severe aortic stenosis is  present. Aortic valve mean gradient measures 14.7 mmHg. Aortic valve peak gradient measures 24.5 mmHg. Aortic valve area, by VTI measures 0.56 cm. Pulmonic Valve: The pulmonic valve was normal in structure. Pulmonic valve regurgitation is trivial. Aorta: The ascending aorta was not well visualized. IAS/Shunts: No atrial level shunt detected by color flow Doppler. Additional Comments: Ant/Apical/Septal/High Lateral Hypo. Bicusp Severe AS.  LEFT VENTRICLE  PLAX 2D LVIDd:         4.50 cm     Diastology LVIDs:         3.30 cm     LV e' medial:    5.66 cm/s LV PW:         1.00 cm     LV E/e' medial:  18.4 LV IVS:        0.80 cm     LV e' lateral:   13.50 cm/s LVOT diam:     2.00 cm     LV E/e' lateral: 7.7 LV SV:         28 LV SV Index:   16 LVOT Area:     3.14 cm  LV Volumes (MOD) LV vol d, MOD A2C: 90.9 ml LV vol d, MOD A4C: 61.0 ml LV vol s, MOD A2C: 63.3 ml LV vol s, MOD A4C: 33.7 ml LV SV MOD A2C:     27.6 ml LV SV MOD A4C:     61.0 ml LV SV MOD BP:      29.5 ml RIGHT VENTRICLE RV Basal diam:  2.50 cm RV S prime:     12.10 cm/s TAPSE (M-mode): 2.6 cm LEFT ATRIUM             Index        RIGHT ATRIUM           Index LA diam:        4.40 cm 2.56 cm/m   RA Area:     16.00 cm LA Vol (A2C):   72.6 ml 42.25 ml/m  RA Volume:   41.80 ml  24.32 ml/m LA Vol (A4C):   52.6 ml 30.61 ml/m LA Biplane Vol: 64.0 ml 37.24 ml/m  AORTIC VALVE AV Area (Vmax):    0.63 cm AV Area (Vmean):   0.51 cm AV Area (VTI):     0.56 cm AV Vmax:           247.67 cm/s AV Vmean:          175.667 cm/s AV VTI:            0.503 m AV Peak Grad:      24.5 mmHg AV Mean Grad:      14.7 mmHg LVOT Vmax:         49.60 cm/s LVOT Vmean:        28.600 cm/s LVOT VTI:          0.090 m LVOT/AV VTI ratio: 0.18  AORTA Ao Root diam: 2.97 cm MITRAL VALVE                TRICUSPID VALVE MV Area (PHT): 4.33 cm     TR Peak grad:   29.2 mmHg MV Area VTI:   0.87 cm     TR Vmax:        270.00 cm/s MV Peak grad:  4.7 mmHg MV Mean grad:  2.0 mmHg     SHUNTS MV Vmax:        1.08 m/s     Systemic VTI:  0.09 m MV Vmean:      69.6 cm/s    Systemic Diam: 2.00 cm MV Decel Time: 175 msec MV E velocity: 104.00 cm/s MV A velocity: 86.50 cm/s MV E/A ratio:  1.20 Dwayne Prince Rome MD Electronically signed by Yolonda Kida MD Signature Date/Time: 08/21/2021/4:07:57 PM    Final     Assessment and plan-   # Sepsis, likely secondary  to upper respiratory infection/bronchitis.   Still febrile febrile, she has upper respiratory infection due to metapneumovirus currently on IV ceftriaxone and Z-Pak for superimposed bacterial pneumonia.  Appreciate ID recommendation. 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  Check immunoglobulin level.  #Non-STEMI, appreciate cardiology recommendation.  On heparin drip now.   #CHF, received diuretics which was held due to increase of creatinine.  #Multiple myeloma status post bone marrow transplant. Myeloma has been in remission and she is on daratumumab injection maintenance.  We will hold due to acute infection. Recommend to avoid NSAIDs if possible.  Tramadol for pain if needed.  Discussed with Dr. Reesa Chew    Acute on chronic thrombocytopenia, likely secondary to acute infection.  Platelet count has improved.   Thank you for allowing me to participate in the care of this patient.   Earlie Server, MD, PhD Hematology Oncology 08/23/2021

## 2021-08-23 NOTE — Plan of Care (Signed)

## 2021-08-23 NOTE — Assessment & Plan Note (Signed)
Most likely secondary to NSTEMI, recent echo with EF of 35 to 40% with regional wall motion abnormalities.  There was some concern of pulmonary edema when chest x-ray was repeated overnight due to worsening shortness of breath.  Received 1 dose of IV Lasix, then held due to bump in creatinine. Clinically appears euvolemic. -Holding diuretic at this time-need to start her on low-dose Lasix on discharge. -Strict intake and output -Daily weight and BMP -Patient needs ischemic work-up-most likely will be done as an outpatient

## 2021-08-23 NOTE — Progress Notes (Signed)
Progress Note   Patient: Sarah [name] WYS:[id number] DOB: 02-24-1956 DOA: 08/18/2021     5 DOS: the patient was seen and examined on 08/23/2021   Brief hospital course: Taken from prior notes.  Sarah [name] is a 66 y.o. 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 faecalis UTI, after presenting with fever, and again in December 2022 with URI ruled out for sepsis.  They presented from home to the ED on 08/18/2021 with fever of 103, cough, sore throat, hoarseness x 2 days.   Patient met sepsis criteria with fever of 102.9, tachycardia, tachypnea, most likely secondary to viral cause as respiratory viral panel positive for metapneumovirus.  Overnight developed chest pain with shortness of breath, elevated troponin which peaked at 2239. EKG with new LBBB, echocardiogram with new onset reduced EF of 30 to 35% and regional wall motion abnormalities, consistent with NSTEMI.  Cardiology was consulted and she was placed on heparin infusion, pending further ischemic evaluation.  2/22: Patient became more short of breath and febrile overnight.  She also developed more chest pain and at times becoming confused persisted.  Able to answer questions appropriately and started today.  Troponin trending down, procalcitonin started going up at 0.4 for now. Chest x-ray with some bilateral infiltrate, received a dose of IV Lasix for concern of pulmonary edema-mild worsening of renal function. Started her on CAP coverage, for concern of superadded bacterial infection.  2/23: Patient continued to have some cough but chest pain improved.  Now on room air. She wants to get established with [facility] cardiology,  stating that she does not connect with the current cardiologist and would also like to switch from [facility] cardiology too as it is becoming harder for her to go over there.  Message sent to Dr. Rockey Situ, they will see her as outpatient.   Assessment and Plan: * Sepsis ([place])- (present on admission) Met sepsis criteria with fever, tachycardia, tachypnea, leukocytosis and respiratory viral panel positive for metapneumovirus.  He received IV antibiotics which were later discontinued as initial procalcitonin was negative. Overnight became febrile again with worsening shortness of breath.  Repeat chest x-ray with bilateral infiltrates/pulmonary edema.  Procalcitonin at 0.44>>0.43  -Continue ceftriaxone and Zithromax for concern of superadded bacterial pneumonia. -Currently on acyclovir  NSTEMI (non-ST elevated myocardial infarction) [facility]) Patient developed chest pain with shortness of breath.  Elevated troponin which peaked at 2239 and now continue to trend down.  EKG with new LBBB and echocardiogram with newly depressed EF with regional wall motion abnormalities consistent with NSTEMI. Cardiology was consulted and she was started on heparin infusion. -Continue heparin infusion-we will complete 72-hour over midnight tonight. -Continue with metoprolol-cardiology switched from home bisoprolol. -Continue with aspirin and statin -Imdur was added today -Patient will need further ischemic work-up-I will defer that decision to cardiology  Acute HFrEF (heart failure with reduced ejection fraction) ([place]) Most likely secondary to NSTEMI, recent echo with EF of 35 to 40% with regional wall motion abnormalities.  There was some concern of pulmonary edema when chest x-ray was repeated overnight due to worsening shortness of breath.  Received 1 dose of IV Lasix, then held due to bump in creatinine. Clinically appears euvolemic. -Holding diuretic at this time-need to start her on low-dose Lasix  on discharge. -Strict  intake and output -Daily weight and BMP -Patient needs ischemic work-up-most likely will be done as an outpatient  Multiple myeloma not having achieved remission ([place])- (present on admission) S/p autologous bone marrow transplant. Oncology on board and they were holding maintenance injections of daratumumab due to acute infection. -Continue to monitor  Thrombocytopenia ([place])- (present on admission) Acute on chronic thrombocytopenia, likely due to acute infection.  Improving, platelet at 109 today -Continue to monitor -Oncology on board  Type II diabetes mellitus with complication ([place])- (present on admission) CBG elevated. -Continue with SSI -Add Semglee 5 units twice daily -Add 3 units for mealtime coverage -Continue to monitor  Severe aortic stenosis Echocardiogram with bicuspid aortic valve. Being managed by [facility] cardiology.  Spinal stenosis of lumbar region with neurogenic claudication- (present on admission) Recently received transforaminal epidural steroid injection as outpatient. -Continue with pain management -Continue with Lyrica  Low immunoglobulin level Received a dose of IVIG  Subjective: Patient was feeling little improved when seen today.  No chest pain.  Continue to have some cough.  She wants to switch her cardiologist, stating that she does not connect with the current one and also want to get established with [facility] as going to [facility] cardiology is becoming harder due to distance.  Physical Exam: Vitals:   08/23/21 0104 08/23/21 0414 08/23/21 0812 08/23/21 0948  BP: 118/74 (!) 97/58  125/67  Pulse:  87  97  Resp:  18  20  Temp:  98 F (36.7 C)  98.5 F (36.9 C)  TempSrc:  Oral  Oral  SpO2:  93% 94% 91%  Weight:      Height:       General.     In no acute distress. Pulmonary.  Lungs clear bilaterally, normal respiratory effort. CV.  Regular rate and rhythm, no JVD, rub or murmur. Abdomen.  Soft, nontender, nondistended, BS  positive. CNS.  Alert and oriented x3.  No focal neurologic deficit. Extremities.  No edema, no cyanosis, pulses intact and symmetrical. Psychiatry.  Judgment and insight appears normal.  Data Reviewed: I reviewed prior notes, labs and images.  Family Communication: Discussed with patient  Disposition: Status is: Inpatient Remains inpatient appropriate because: Severity of illness   Planned Discharge Destination: Home  DVT prophylaxis.  Heparin infusion  Time spent: 45 minutes  This record has been created using Systems analyst. Errors have been sought and corrected,but may not always be located. Such creation errors do not reflect on the standard of care.  Author: Lorella Nimrod, MD 08/23/2021 2:39 PM  For on call review [email or web address].

## 2021-08-23 NOTE — Progress Notes (Signed)
ANTICOAGULATION CONSULT NOTE  Pharmacy Consult for heparin infusion Indication: ACS/STEMI  Allergies  Allergen Reactions   Oxycodone-Acetaminophen Anaphylaxis    Swelling and rash   Celebrex [Celecoxib] Diarrhea   Codeine    Plerixafor     In 2016 during ASCT collection patient developed fever to 103.16F and required hospitalization   Benadryl [Diphenhydramine] Palpitations   Morphine Itching and Rash   Ondansetron Diarrhea   Tylenol [Acetaminophen] Itching and Rash    Patient Measurements: Height: 5' 2"  (157.5 cm) Weight: 70.6 kg (155 lb 10.3 oz) IBW/kg (Calculated) : 50.1 Heparin Dosing Weight: 65kg  Vital Signs: Temp: 98.5 F (36.9 C) (02/23 0948) Temp Source: Oral (02/23 0948) BP: 125/67 (02/23 0948) Pulse Rate: 97 (02/23 0948)  Labs: Recent Labs    08/21/21 0505 08/21/21 0725 08/22/21 0533 08/22/21 0857 08/22/21 1027 08/22/21 1519 08/22/21 1710 08/23/21 0056 08/23/21 0220 08/23/21 0410 08/23/21 0955  HGB 10.4*  --  10.4*  --   --   --   --   --  10.1*  --   --   HCT 31.8*  --  30.9*  --   --   --   --   --  30.0*  --   --   PLT 89*  --  102*  --   --   --   --   --  109*  --   --   APTT 51*  --   --   --   --   --   --   --   --   --   --   LABPROT 14.5  --   --   --   --   --   --   --   --   --   --   INR 1.1  --   --   --   --   --   --   --   --   --   --   HEPARINUNFRC 0.40   < > 0.29*  --   --    < > >1.10*  --  >1.10*  --  <0.10*  CREATININE 1.08*  --  1.29*  --   --   --   --  1.06* 1.12*  --   --   TROPONINIHS 2,239*   < >  --    < > 1,194*  --   --   --  915* 840*  --    < > = values in this interval not displayed.     Estimated Creatinine Clearance: 46.7 mL/min (A) (by C-G formula based on SCr of 1.12 mg/dL (H)).   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   Due to Z[place]eta   Cardiomyopathy, idiopathic ([place]) 08/21/2017   Overview:  Septal and apical hypokinesis with ef 40%   CHF (congestive heart failure) (HCC)    CKD (chronic kidney disease) stage 3, GFR 30-59 ml/min (HCC)    Depression    Diabetes mellitus (HCC)    Dizziness    Fatty liver    Frequent falls    GERD (gastroesophageal reflux disease)    Gout    Heart murmur    History of blood transfusion  History of bone marrow transplant ([place])    History of uterine fibroid    Hx of cardiac catheterization 06/05/2016   Overview:  Normal coronaries 2017   Hypertension    Hypomagnesemia    IDA (iron deficiency anemia)    Multiple myeloma (HCC)    Personal history of chemotherapy    Renal cyst    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 Presented to ED on 2/18 with SOB. Then, on 2/20, found to have elevated troponins (pk 2,143). EKG showed LBBB, ECHO with depressed EF with regional wall motion abnormalities consistent with NSTEMI. Plan is to continue heparin infusion for 72 hours: stop date in place for 0100 on 2/24  Goal of Therapy:  Heparin level 0.3-0.7 units/ml Monitor platelets by anticoagulation protocol: Yes   Plan:  heparin level remains subtherapeutic: Will increase infusion to 900 units/hr Stop date in place for heparin (0100 on 2/24): no further levels to be drawn  Vallery Sa, PharmD, BCPS 08/23/2021 2:26 PM

## 2021-08-23 NOTE — Assessment & Plan Note (Signed)
Patient developed chest pain with shortness of breath.  Elevated troponin which peaked at 2239 and now continue to trend down.  EKG with new LBBB and echocardiogram with newly depressed EF with regional wall motion abnormalities consistent with NSTEMI. Cardiology was consulted and she was started on heparin infusion. -Continue heparin infusion-we will complete 72-hour over midnight tonight. -Continue with metoprolol-cardiology switched from home bisoprolol. -Continue with aspirin and statin -Imdur was added today -Patient will need further ischemic work-up-I will defer that decision to cardiology

## 2021-08-24 ENCOUNTER — Other Ambulatory Visit: Payer: Self-pay

## 2021-08-24 DIAGNOSIS — J40 Bronchitis, not specified as acute or chronic: Secondary | ICD-10-CM | POA: Diagnosis not present

## 2021-08-24 DIAGNOSIS — R509 Fever, unspecified: Secondary | ICD-10-CM | POA: Diagnosis not present

## 2021-08-24 DIAGNOSIS — C9001 Multiple myeloma in remission: Secondary | ICD-10-CM | POA: Diagnosis not present

## 2021-08-24 DIAGNOSIS — Z9484 Stem cells transplant status: Secondary | ICD-10-CM | POA: Diagnosis not present

## 2021-08-24 DIAGNOSIS — J208 Acute bronchitis due to other specified organisms: Secondary | ICD-10-CM | POA: Diagnosis not present

## 2021-08-24 DIAGNOSIS — R768 Other specified abnormal immunological findings in serum: Secondary | ICD-10-CM | POA: Diagnosis not present

## 2021-08-24 LAB — CBC
HCT: 27.4 % — ABNORMAL LOW (ref 36.0–46.0)
Hemoglobin: 9 g/dL — ABNORMAL LOW (ref 12.0–15.0)
MCH: 26.6 pg (ref 26.0–34.0)
MCHC: 32.8 g/dL (ref 30.0–36.0)
MCV: 81.1 fL (ref 80.0–100.0)
Platelets: 105 10*3/uL — ABNORMAL LOW (ref 150–400)
RBC: 3.38 MIL/uL — ABNORMAL LOW (ref 3.87–5.11)
RDW: 14.6 % (ref 11.5–15.5)
WBC: 7.2 10*3/uL (ref 4.0–10.5)
nRBC: 0 % (ref 0.0–0.2)

## 2021-08-24 LAB — COMPREHENSIVE METABOLIC PANEL
ALT: 33 U/L (ref 0–44)
AST: 34 U/L (ref 15–41)
Albumin: 2.8 g/dL — ABNORMAL LOW (ref 3.5–5.0)
Alkaline Phosphatase: 78 U/L (ref 38–126)
Anion gap: 11 (ref 5–15)
BUN: 24 mg/dL — ABNORMAL HIGH (ref 8–23)
CO2: 22 mmol/L (ref 22–32)
Calcium: 8.3 mg/dL — ABNORMAL LOW (ref 8.9–10.3)
Chloride: 100 mmol/L (ref 98–111)
Creatinine, Ser: 1.14 mg/dL — ABNORMAL HIGH (ref 0.44–1.00)
GFR, Estimated: 54 mL/min — ABNORMAL LOW (ref >60)
Glucose, Bld: 112 mg/dL — ABNORMAL HIGH (ref 70–99)
Potassium: 3.4 mmol/L — ABNORMAL LOW (ref 3.5–5.1)
Sodium: 133 mmol/L — ABNORMAL LOW (ref 135–145)
Total Bilirubin: 0.5 mg/dL (ref 0.3–1.2)
Total Protein: 6.2 g/dL — ABNORMAL LOW (ref 6.5–8.1)

## 2021-08-24 LAB — URINALYSIS, COMPLETE (UACMP) WITH MICROSCOPIC
Bacteria, UA: NONE SEEN
Bilirubin Urine: NEGATIVE
Glucose, UA: NEGATIVE mg/dL
Hgb urine dipstick: NEGATIVE
Ketones, ur: 5 mg/dL — AB
Leukocytes,Ua: NEGATIVE
Nitrite: NEGATIVE
Protein, ur: NEGATIVE mg/dL
Specific Gravity, Urine: 1.009 (ref 1.005–1.030)
pH: 5 (ref 5.0–8.0)

## 2021-08-24 LAB — GLUCOSE, CAPILLARY
Glucose-Capillary: 124 mg/dL — ABNORMAL HIGH (ref 70–99)
Glucose-Capillary: 129 mg/dL — ABNORMAL HIGH (ref 70–99)
Glucose-Capillary: 99 mg/dL (ref 70–99)
Glucose-Capillary: 152 mg/dL — ABNORMAL HIGH (ref 70–99)

## 2021-08-24 LAB — PROCALCITONIN: Procalcitonin: 0.27 ng/mL

## 2021-08-24 LAB — LIPID PANEL
Cholesterol: 99 mg/dL (ref 0–200)
HDL: 40 mg/dL — ABNORMAL LOW (ref >40)
LDL Cholesterol: 44 mg/dL (ref 0–99)
Total CHOL/HDL Ratio: 2.5 RATIO
Triglycerides: 77 mg/dL (ref <150)
VLDL: 15 mg/dL (ref 0–40)

## 2021-08-24 LAB — CRYPTOCOCCAL ANTIGEN: Crypto Ag: NEGATIVE

## 2021-08-24 LAB — LACTATE DEHYDROGENASE: LDH: 175 U/L (ref 98–192)

## 2021-08-24 MED ORDER — GUAIFENESIN-DM 100-10 MG/5ML PO SYRP
5.0000 mL | ORAL_SOLUTION | ORAL | Status: DC | PRN
  Administered 2021-08-24: 5 mL via ORAL
  Filled 2021-08-24: qty 5

## 2021-08-24 MED ORDER — PROMETHAZINE HCL 25 MG PO TABS
12.5000 mg | ORAL_TABLET | Freq: Once | ORAL | Status: AC
  Administered 2021-08-24: 12.5 mg via ORAL
  Filled 2021-08-24: qty 1

## 2021-08-24 MED ORDER — ENOXAPARIN SODIUM 40 MG/0.4ML IJ SOSY
40.0000 mg | PREFILLED_SYRINGE | INTRAMUSCULAR | Status: DC
  Administered 2021-08-24: 40 mg via SUBCUTANEOUS
  Filled 2021-08-24: qty 0.4

## 2021-08-24 MED ORDER — ENSURE ENLIVE PO LIQD
237.0000 mL | Freq: Two times a day (BID) | ORAL | Status: DC
  Administered 2021-08-25 (×2): 237 mL via ORAL

## 2021-08-24 MED ORDER — HEPARIN SOD (PORK) LOCK FLUSH 100 UNIT/ML IV SOLN
500.0000 [IU] | INTRAVENOUS | Status: DC
  Administered 2021-08-24: 500 [IU]
  Filled 2021-08-24: qty 5

## 2021-08-24 MED ORDER — PROMETHAZINE HCL 25 MG PO TABS
25.0000 mg | ORAL_TABLET | Freq: Four times a day (QID) | ORAL | Status: DC | PRN
  Administered 2021-08-24 – 2021-08-25 (×2): 25 mg via ORAL
  Filled 2021-08-24 (×3): qty 1

## 2021-08-24 MED ORDER — HEPARIN SOD (PORK) LOCK FLUSH 100 UNIT/ML IV SOLN
500.0000 [IU] | INTRAVENOUS | Status: DC | PRN
  Filled 2021-08-24: qty 5

## 2021-08-24 MED ORDER — POTASSIUM CHLORIDE CRYS ER 20 MEQ PO TBCR
40.0000 meq | EXTENDED_RELEASE_TABLET | Freq: Once | ORAL | Status: AC
  Administered 2021-08-24: 40 meq via ORAL
  Filled 2021-08-24: qty 2

## 2021-08-24 NOTE — Progress Notes (Signed)
Consulted IV team because RN forgot to heparin flush port prior to de accessing. Mickel Baas, RN from IV team came to bedside and re accessed port and flushed with heparin prior to de accessing. Patient tolerated well and thankful.

## 2021-08-24 NOTE — Progress Notes (Signed)
Porta cath to right upper chest de accessed per Dr. Delaine Lame.

## 2021-08-24 NOTE — Assessment & Plan Note (Signed)
Received a dose of IVIG -Repeat immunoglobulin levels were ordered by oncology-pending results

## 2021-08-24 NOTE — Progress Notes (Signed)
ID Sarah [name] is a 66 y.o. female with a history of multiple myeloma status post  2 auto-HCT. 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 Presents to the hospital with fever for 3 days duration. Also has a cough, chest tightness and shortness of breath Patient lives with her niece and her 45-year-old daughter and sister.  The 34-year-old child had cough and some respiratory illness prior to the patient getting sick.   was recently hospitalized I 06/19/2021   for fever with sore gums after dental procedure Was treated like a UTI but she was not symptomatic 05/01/2021 patient sister had flu and patient became sick and received.tamiflu   03/05/2021  community-acquired pneumonia    She saw Infectious Disease physician at [facility] on 08/08/2021 and they recommended IVIG monthly transfusions  On 08/09/2021 she received transforaminal epidural steroid injection for lumbar radiculitis, stenosis with neurogenic claudication   Subjective Patient complains that her cervical spondylosis and pinched nerve that is acting up on the left side with pain going down the left arm.  She has had this in the past.  She has tried acupuncture in the past Cough still present Says she had fever this morning No pain abdomen or diarrhea No dysuria or hematuria   O/e awake and alert No distress at rest Oral cavity - thrush Patient Vitals for the past 24 hrs:  BP Temp Temp src Pulse Resp SpO2  08/24/21 1251 (!) 120/59 (!) 100.7 F (38.2 C) Oral 90 18 92 %  08/24/21 0741 137/82 98.1 F (36.7 C) Oral 99 18 100 %  08/24/21 0334 113/68 98.4 F (36.9 C) Oral 90 18 92 %  08/24/21 0247 -- -- -- -- -- 92 %  08/23/21 2327 (!) 93/53 98.6 F (37 C) Oral 73 16 92 %  08/23/21 2157 (!) 94/58 98.6 F (37 C) Oral 73 18 --  08/23/21 2024 -- 99.3 F (37.4 C) Oral -- -- --   08/23/21 1933 112/64 (!) 102.7 F (39.3 C) -- (!) 106 18 93 %  08/23/21 1700 120/61 (!) 103.3 F (39.6 C) Oral (!) 106 20 93 %  08/23/21 1626 131/69 (!) 102.9 F (39.4 C) -- 99 20 90 %      Chest b/l air entry- few rhonchi  Hs - systolic murmur PORT in place And soft SKIn N CNS non focal  Labs CBC Latest Ref Rng & Units 08/24/2021 08/23/2021 08/22/2021  WBC 4.0 - 10.5 K/uL 7.2 7.7 9.1  Hemoglobin 12.0 - 15.0 g/dL 9.0(L) 10.1(L) 10.4(L)  Hematocrit 36.0 - 46.0 % 27.4(L) 30.0(L) 30.9(L)  Platelets 150 - 400 K/uL 105(L) 109(L) 102(L)    CMP Latest Ref Rng & Units 08/24/2021 08/23/2021 08/23/2021  Glucose 70 - 99 mg/dL 112(H) 173(H) 187(H)  BUN 8 - 23 mg/dL 24(H) 25(H) 26(H)  Creatinine 0.44 - 1.00 mg/dL 1.14(H) 1.12(H) 1.06(H)  Sodium 135 - 145 mmol/L 133(L) 132(L) 134(L)  Potassium 3.5 - 5.1 mmol/L 3.4(L) 4.0 3.8  Chloride 98 - 111 mmol/L 100 101 101  CO2 22 - 32 mmol/L _0 Calcium 8.9 - 10.3 mg/dL 8.3(L) 8.0(L) 8.1(L)  Total Protein 6.5 - 8.1 g/dL 6.2(L) - -  Total Bilirubin 0.3 - 1.2 mg/dL 0.5 - -  Alkaline Phos 38 - 126 U/L 78 - -  AST 15 - 41 U/L 34 - -  ALT 0 - 44  U/L 33 - -    MICRO 2/18 BC - NG 2/19 resp pathogen positive for metapneumovirus 2/19 Sputum culture NG MRSa nares neg Infectious disease work-up Crypto antigen negative  HIV nonreactive SARS-CoV-2 negative Fungitell in process Toxoplasma antibody in process CMV DNA pending Epstein-Barr virus DNA pending Fungal antibodies- P   CXR   B/l increased interstitial markings  Impression/recommendation  Multiple myeloma - s/p 2 autologous HCT- last one on 07/21/19 . On monthly daratumumab  Fever : No neutropenia. No abnormal LFTS She has metapneumovirus infection-bronchitis/bronchiolitis  no pneumonia Has blood tinged sputum- started on ceftriaxone and zithromax Fever is persisting. Has port-deaccessed today and do not use.  If the fevers spikes in spite of that then we will repeat blood  culture.  We will also need imaging of the spine especially with recent lumbar spine corticosteroid injection and pain in the cervical spine which is  due to her spondylosis.    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 Levels sent On prophylactic acyclovir  Bicuspid calcified aortic valve and 2D echo reveals moderate calcification with mild thickening and severe aortic valve stenosis  NSTEMI   Thrombocytopenia-chronic  slightly worse  Discussed the management with patient and hospitalist under her nurse  On-call ID available remotely this weekend.  Call if needed  From 08/27/2021 [facility] ID physicians will cover [facility] while I am away.

## 2021-08-24 NOTE — TOC Initial Note (Signed)
Transition of Care [facility]) - Initial/Assessment Note    Patient Details  Name: Sameerah Nachtigal MRN: [id number] Date of Birth: 11/14/1955  Transition of [facility]) CM/SW Contact:    Magnus Ivan, LCSW Phone Number: 08/24/2021, 12:12 PM  Clinical Narrative:                 Spoke with patient regarding PT recs for Home Health PT. Patient lives with her sister, niece, and niece's kids.  PCP is Dr. Army Melia. Pharmacy is mail order or [facility] in [place].  Patient has a cane and RW. No HH history.  Patient usually drives herself places, states family can take her if she is unable.  Patient is agreeable to HHPT, no agency preference. [place] referral made to [facility] with [place].         Patient Goals and CMS Choice Patient states their goals for this hospitalization and ongoing recovery are:: home with home health CMS Medicare.gov Compare Post Acute Care list provided to:: Patient Choice offered to / list presented to : Patient  Expected Discharge Plan and Services         Living arrangements for the past 2 months: [place]: [place]: [place] Date [place]: 08/24/21   Representative spoke with at [place]: Tommi Rumps  Prior Living Arrangements/Services Living arrangements for the past 2 months: [place] Lives with:: Relatives, Siblings Patient language and need for interpreter reviewed:: Yes Do you feel safe going back to the place where you live?: Yes      Need for Family Participation in Patient Care: Yes (Comment) Care giver support system in place?: Yes (comment) Current home services: DME Criminal Activity/Legal Involvement Pertinent to Current Situation/Hospitalization: No - Comment as needed  Activities of Daily Living Home Assistive Devices/Equipment: CBG Meter, Eyeglasses, Dentures (specify type) ADL Screening (condition at time of admission) Patient's cognitive ability adequate  to safely complete daily activities?: Yes Is the patient deaf or have difficulty hearing?: No Does the patient have difficulty seeing, even when wearing glasses/contacts?: No Does the patient have difficulty concentrating, remembering, or making decisions?: No Patient able to express need for assistance with ADLs?: Yes Does the patient have difficulty dressing or bathing?: Yes Independently performs ADLs?: Yes (appropriate for developmental age) Does the patient have difficulty walking or climbing stairs?: No Weakness of Legs: None Weakness of Arms/Hands: None  Permission Sought/Granted Permission sought to share information with : Facility Art therapist granted to share information with : Yes, Verbal Permission Granted     Permission granted to share info w AGENCY: [place] agencies        Emotional Assessment       Orientation: : Oriented to Self, Oriented to Place, Oriented to  Time, Oriented to Situation Alcohol / Substance Use: Not Applicable Psych Involvement: No (comment)  Admission diagnosis:  UTI (urinary tract infection) [N39.0] Bronchitis [J40] Fever, unspecified fever cause [R50.9] Urinary tract infection without hematuria, site unspecified [N39.0] Patient Active Problem List   Diagnosis Date Noted   Acute HFrEF (heart failure with reduced ejection fraction) ([place]) 08/22/2021   NSTEMI (non-ST elevated myocardial infarction) ([place]) 08/21/2021   Low immunoglobulin level    Bronchitis    Fever    Acute gingivitis 06/24/2021   URI (upper respiratory infection) 06/23/2021   Chronic diarrhea 06/23/2021  Multiple myeloma in remission ([place]) 05/14/2021   Leg swelling 04/02/2021   Neuropathy 04/02/2021   Fever in adult 03/05/2021   Frequent PVCs 12/04/2020   Diarrhea 10/25/2020   Encounter for antineoplastic chemotherapy 08/31/2020   Coronary artery disease involving native coronary artery of native heart 04/26/2020   Yeast vaginitis 03/18/2020    Hypokalemia 03/11/2020   UTI (urinary tract infection) 03/03/2020   Dyspnea 03/02/2020   Physical deconditioning 02/07/2020   Impaired nutrition 02/07/2020   Sepsis ([place]) 11/13/2019   Encounter for antineoplastic immunotherapy 10/03/2019   Hypocalcemia 08/28/2019   Chronic nausea 12/17/2018   Chemotherapy-induced neuropathy ([place]) 12/17/2018   Hyperlipidemia associated with type 2 diabetes mellitus ([place]) 11/25/2018   Anemia 10/21/2018   Hypomagnesemia 08/20/2018   Goals of care, counseling/discussion 08/20/2018   B12 deficiency 08/20/2018   Thrombocytopenia ([place]) 02/09/2018   Benign essential HTN 08/21/2017   Carotid artery stenosis, asymptomatic, bilateral 08/06/2017   Thyroid nodule 08/06/2017   Iron deficiency anemia due to chronic blood loss 05/13/2017   Spinal stenosis of lumbar region with neurogenic claudication 04/09/2017   Long term prescription opiate use 09/07/2016   Chronic pain syndrome 07/08/2016   Pain medication agreement signed 07/08/2016   Spondylosis of lumbar region without myelopathy or radiculopathy 07/08/2016   Severe aortic stenosis 06/05/2016   Environmental and seasonal allergies 04/19/2016   Insomnia 04/19/2016   Asthma 04/19/2016   Aortic regurgitation 04/19/2016   Type II diabetes mellitus with complication ([place]) 14/43/1540   Depression, major, single episode, in partial remission ([place]) 04/12/2016   Irritable bowel syndrome (IBS) 04/12/2016   Hepatic steatosis 04/12/2016   Multiple myeloma not having achieved remission ([place]) 04/02/2016   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[place]) 07/06/2015   Stem cell transplant candidate 05/16/2015   PCP:  Glean Hess, MD Pharmacy:   [facility] [place], [address] [place] [address] Phone: [phone] Fax: [phone]  [place], [address] [place] [address] Phone:  [phone] Fax: [phone]  [facility] [place], [address] [place] [address] Phone: [phone] Fax: [phone]     Social Determinants of Health (SDOH) Interventions    Readmission Risk Interventions Readmission Risk Prevention Plan 03/06/2021  Transportation Screening Complete  PCP or Specialist Appt within 3-5 Days Complete  HRI or Home Care Consult Complete  Social Work Consult for [place] Planning/Counseling Complete  Palliative Care Screening Not Applicable  Medication Review (RN Care Manager) Referral to Pharmacy  Some recent data might be hidden

## 2021-08-24 NOTE — Assessment & Plan Note (Signed)
Patient developed chest pain with shortness of breath.  Elevated troponin which peaked at 2239 and now continue to trend down.  EKG with new LBBB and echocardiogram with newly depressed EF with regional wall motion abnormalities consistent with NSTEMI. Cardiology was consulted and she was started on heparin infusion. -Completed 72 hours of heparin infusion and remained chest pain-free. -Continue with metoprolol-cardiology switched from home bisoprolol. -Continue with aspirin and statin -Imdur was added today -Patient will need further ischemic work-up-I will defer that decision to cardiology -Patient would like to follow-up with [facility] cardiology group as an outpatient for further recommendations.

## 2021-08-24 NOTE — Progress Notes (Signed)
Sent Dr. Reesa Chew secure chat and let MD know that patient is complaining of nausea and asking for more phenergan stating she takes 25 mg at home. MD gave order for one time dose of 12.5 mg phenergan PO once and stated she would change PRN to 25 mg.

## 2021-08-24 NOTE — Progress Notes (Signed)
Received call from telemetry regarding possible ST elevation, patient assessed: denies symptoms, no apparent changes in physical assessment. Vital signs largely similar to prior. EKG obtained, with auto-read showing LBBB. Messaged on-call NP Hassan Rowan with this information. Remains on telemetry with fresh battery.

## 2021-08-24 NOTE — Progress Notes (Signed)
Progress Note   Patient: Sarah [name] ELF:[id number] DOB: 16-Oct-1955 DOA: 08/18/2021     6 DOS: the patient was seen and examined on 08/24/2021   Brief hospital course: Taken from prior notes.  Sarah [name] is a 66 y.o. female with a PMH significant for Multiple myeloma s/p autologous stem cell transplant and on maintenance chemotherapy , asthma, DM,  severe nonrheumatic aortic valve stenosis followed by [facility] cardiology, thrombocytopenia, chronic pain from spinal stenosis , chemotherapy induced neuropathy and diarrhea hospitalized in September 2022 with possible CAP and E. faecalis UTI, after presenting with fever, and again in December 2022 with URI ruled out for sepsis.  They presented from home to the ED on 08/18/2021 with fever of 103, cough, sore throat, hoarseness x 2 days.   Patient met sepsis criteria with fever of 102.9, tachycardia, tachypnea, most likely secondary to viral cause as respiratory viral panel positive for metapneumovirus.  Overnight developed chest pain with shortness of breath, elevated troponin which peaked at 2239. EKG with new LBBB, echocardiogram with new onset reduced EF of 30 to 35% and regional wall motion abnormalities, consistent with NSTEMI.  Cardiology was consulted and she was placed on heparin infusion, pending further ischemic evaluation.  2/22: Patient became more short of breath and febrile overnight.  She also developed more chest pain and at times becoming confused persisted.  Able to answer questions appropriately and started today.  Troponin trending down, procalcitonin started going up at 0.4 for now. Chest x-ray with some bilateral infiltrate, received a dose of IV Lasix for concern of pulmonary edema-mild worsening of renal function. Started her on CAP coverage, for concern of superadded bacterial infection.  2/23: Patient continued to have some cough but chest pain improved.  Now on room air. She wants to get established with [facility] cardiology,  stating that she does not connect with the current cardiologist and would also like to switch from [facility] cardiology too as it is becoming harder for her to go over there.  Message sent to Dr. Rockey Situ, they will see her as outpatient.  2/24: Patient had another febrile episode yesterday evening, fever of 103.3.  Little difficult situation as she is allergic to Tylenol and oncology does not want her to take any Advil.  She was given 1 time dose of Advil with cooling blanket.  Remained afebrile this morning.  Heparin infusion was finished overnight after completing 72 hours and cardiology signed off. Patient wants to follow-up with [facility] cardiology group as an outpatient.   Assessment and Plan: * Sepsis ([place])- (present on admission) Met sepsis criteria with fever, tachycardia, tachypnea, leukocytosis and respiratory viral panel positive for metapneumovirus.  He received IV antibiotics which were later discontinued as initial procalcitonin was negative. Overnight became febrile again with worsening shortness of breath.  Repeat chest x-ray with bilateral infiltrates/pulmonary edema.  Procalcitonin at 0.44>>0.43>.0.27 .  Oncology ordered CMV and cytomegalovirus labs.  Infectious disease ordered cryptococcal labs.  IgG levels were also ordered.  Sputum culture with normal flora. -Continue ceftriaxone and Zithromax for concern of superadded bacterial pneumonia and complete a 5-day course  -Currently on acyclovir -Follow-up pending labs  NSTEMI (non-ST elevated myocardial infarction) [facility]) Patient developed chest pain with shortness of breath.  Elevated troponin which peaked at 2239 and now continue to trend down.  EKG with new LBBB and echocardiogram with newly depressed EF with regional wall motion abnormalities consistent with NSTEMI. Cardiology was consulted and she was started on heparin infusion. -Completed 72 hours of heparin  infusion and remained chest pain-free. -Continue with metoprolol-cardiology  switched from home bisoprolol. -Continue with aspirin and statin -Imdur was added today -Patient will need further ischemic work-up-I will defer that decision to cardiology -Patient would like to follow-up with [facility] cardiology group as an outpatient for further recommendations.  Acute HFrEF (heart failure with reduced ejection fraction) ([place])- (present on admission) Most likely secondary to NSTEMI, recent echo with EF of 35 to 40% with regional wall motion abnormalities.  There was some concern of pulmonary edema when chest x-ray was repeated overnight due to worsening shortness of breath.  Received 1 dose of IV Lasix, then held due to bump in creatinine. Clinically appears euvolemic. -Holding diuretic at this time-need to start her on low-dose Lasix on discharge. -Strict intake and output -Daily weight and BMP -Patient needs ischemic work-up-most likely will be done as an outpatient  Multiple myeloma not having achieved remission ([place])- (present on admission) S/p autologous bone marrow transplant. Oncology on board and they were holding maintenance injections of daratumumab due to acute infection. -Continue to monitor  Thrombocytopenia ([place])- (present on admission) Acute on chronic thrombocytopenia, likely due to acute infection.  Improving, platelet at 109 today -Continue to monitor -Oncology on board  Type II diabetes mellitus with complication ([employer])- (present on admission) CBG elevated. -Continue with SSI -Add Semglee 5 units twice daily -Add 3 units for mealtime coverage -Continue to monitor  Severe aortic stenosis Echocardiogram with bicuspid aortic valve. Being managed by [facility] cardiology.  Spinal stenosis of lumbar region with neurogenic claudication- (present on admission) Recently received transforaminal epidural steroid injection as outpatient. -Continue with pain management -Continue with Lyrica  Low immunoglobulin level- (present on admission) Received a  dose of IVIG -Repeat immunoglobulin levels were ordered by oncology-pending results   Subjective: Patient was sitting comfortably in chair when seen today.  She wants to go home.  Denies any chest pain.  We discussed about becoming febrile again.  Oncology and ID would like to see 24-hour of being nonfebrile.  Most likely can go tomorrow.  She also agrees to try Tylenol with Benadryl if needed.  Physical Exam: Vitals:   08/24/21 0247 08/24/21 0334 08/24/21 0741 08/24/21 1251  BP:  113/68 137/82 (!) 120/59  Pulse:  90 99 90  Resp:  18 18 18   Temp:  98.4 F (36.9 C) 98.1 F (36.7 C) (!) 100.7 F (38.2 C)  TempSrc:  Oral Oral Oral  SpO2: 92% 92% 100% 92%  Weight:      Height:       General.     In no acute distress. Pulmonary.  Lungs clear bilaterally, normal respiratory effort. CV.  Regular rate and rhythm, no JVD, rub or murmur. Abdomen.  Soft, nontender, nondistended, BS positive. CNS.  Alert and oriented x3.  No focal neurologic deficit. Extremities.  No edema, no cyanosis, pulses intact and symmetrical. Psychiatry.  Judgment and insight appears normal.  Data Reviewed: I reviewed prior notes and labs.  Family Communication: Discussed with patient  Disposition: Status is: Inpatient Remains inpatient appropriate because: Severity of illness, continue to have spikes of fever. If no fever today then can be discharged tomorrow.   Planned Discharge Destination: Home with Home Health  DVT prophylaxis.  Lovenox  Time spent: 40 minutes  This record has been created using Systems analyst. Errors have been sought and corrected,but may not always be located. Such creation errors do not reflect on the standard of care.  Author: Lorella Nimrod,  MD 08/24/2021 2:31 PM  For on call review [email or web address].

## 2021-08-24 NOTE — Progress Notes (Signed)
Reaccessed PAC for heparin flush per RN request. Sterile technique utilized per protocol. Flushed with good blood return. Heparinized and deaccessed. Bandaid applied. Pt tolerated well.

## 2021-08-24 NOTE — Progress Notes (Signed)
Patient requesting ensure therefore sent Dr. Reesa Chew secure chat. MD gave order for ensure.

## 2021-08-24 NOTE — Evaluation (Signed)
Physical Therapy Evaluation Patient Details Name: Sarah [name] MRN: [id number] DOB: [birth year] Today's Date: 08/24/2021  History of Present Illness  Pt is a 66 y.o. female with medical history significant for multiple myeloma s/p autologous stem cell transplant and on maintenance chemotherapy, asthma, DM, severe nonrheumatic aortic valve stenosis followed by [facility] cardiology thrombocytopenia, chronic pain from spinal stenosis , chemotherapy induced neuropathy and diarrhea, presents with a fever of 103 at home with complaints of cough, sore throat, hoarseness. MD assessment includes: sepsis, NSTEMI, and acute CHF.   Clinical Impression  Pt was pleasant and motivated to participate during the session and put forth good effort throughout. Pt required no physical assistance with functional tasks and was generally steady with transfers and gait without an AD.  Pt's SpO2 on room air measured pre and post activity remained in the low to mid 90s with HR increasing from 104 bpm at rest to 120 bpm after amb.  Pt will benefit from HHPT upon discharge to safely address deficits listed in patient problem list for decreased caregiver assistance and eventual return to PLOF.         Recommendations for follow up therapy are one component of a multi-disciplinary discharge planning process, led by the attending physician.  Recommendations may be updated based on patient status, additional functional criteria and insurance authorization.  Follow Up Recommendations Home health PT    Assistance Recommended at Discharge Intermittent Supervision/Assistance  Patient can return home with the following  A little help with walking and/or transfers;A little help with bathing/dressing/bathroom;Assistance with cooking/housework;Assist for transportation;Help with stairs or ramp for entrance    Equipment Recommendations None recommended by PT  Recommendations for Other Services       Functional Status Assessment  Patient has had a recent decline in their functional status and demonstrates the ability to make significant improvements in function in a reasonable and predictable amount of time.     Precautions / Restrictions Precautions Precautions: Fall Restrictions Weight Bearing Restrictions: No      Mobility  Bed Mobility Overal bed mobility: Independent                  Transfers Overall transfer level: Needs assistance Equipment used: None Transfers: Sit to/from Stand Sit to Stand: Supervision           General transfer comment: Good eccentric and concentric control    Ambulation/Gait Ambulation/Gait assistance: Supervision Gait Distance (Feet): 30 Feet Assistive device: None Gait Pattern/deviations: Step-through pattern, Decreased step length - right, Decreased step length - left Gait velocity: decreased     General Gait Details: Slow cadence but steady without LOB  Stairs            Wheelchair Mobility    Modified Rankin (Stroke Patients Only)       Balance Overall balance assessment: Needs assistance   Sitting balance-Leahy Scale: Normal     Standing balance support: No upper extremity supported Standing balance-Leahy Scale: Good                               Pertinent Vitals/Pain Pain Assessment Pain Assessment: 0-10 Pain Score: 5  Pain Location: neck Pain Descriptors / Indicators: Sore Pain Intervention(s): Premedicated before session, Monitored during session    Home Living Family/patient expects to be discharged to:: Private residence Living Arrangements: Other relatives (sister) Available Help at Discharge: Family;Available 24 hours/day Type of Home: Mobile home Home Access: Stairs to enter Entrance  Stairs-Rails: None Entrance Stairs-Number of Steps: 1+1   Home Layout: One level Home Equipment: Conservation officer, nature (2 wheels);Cane - single point      Prior Function Prior Level of Function : Independent/Modified  Independent             Mobility Comments: Ind amb community distances without an AD, 3 falls in last 6 months secondary to tripping/slipping and once instance of LE buckling ADLs Comments: Ind with ADLs     Hand Dominance   Dominant Hand: Right    Extremity/Trunk Assessment   Upper Extremity Assessment Upper Extremity Assessment: Overall WFL for tasks assessed    Lower Extremity Assessment Lower Extremity Assessment: Generalized weakness       Communication   Communication: No difficulties  Cognition Arousal/Alertness: Awake/alert Behavior During Therapy: WFL for tasks assessed/performed Overall Cognitive Status: Within Functional Limits for tasks assessed                                          General Comments      Exercises Total Joint Exercises Ankle Circles/Pumps: AROM, Strengthening, Both, 5 reps, 10 reps Quad Sets: Strengthening, Both, 10 reps Gluteal Sets: 10 reps, Both, Strengthening Other Exercises Other Exercises: HEP education for BLE APs, QS, GS, hip abd/add, SLR, and low amp bridges   Assessment/Plan    PT Assessment Patient needs continued PT services  PT Problem List Decreased strength;Decreased activity tolerance;Decreased balance;Decreased mobility       PT Treatment Interventions      PT Goals (Current goals can be found in the Care Plan section)  Acute Rehab PT Goals Patient Stated Goal: To get stronger PT Goal Formulation: With patient Time For Goal Achievement: 09/06/21 Potential to Achieve Goals: Good    Frequency Min 2X/week     Co-evaluation               AM-PAC PT "6 Clicks" Mobility  Outcome Measure Help needed turning from your back to your side while in a flat bed without using bedrails?: None Help needed moving from lying on your back to sitting on the side of a flat bed without using bedrails?: None Help needed moving to and from a bed to a chair (including a wheelchair)?: A Little Help  needed standing up from a chair using your arms (e.g., wheelchair or bedside chair)?: A Little Help needed to walk in hospital room?: A Little Help needed climbing 3-5 steps with a railing? : A Little 6 Click Score: 20    End of Session   Activity Tolerance: Patient tolerated treatment well Patient left: in chair;with call bell/phone within reach;with chair alarm set Nurse Communication: Mobility status PT Visit Diagnosis: History of falling (Z91.81);Muscle weakness (generalized) (M62.81);Difficulty in walking, not elsewhere classified (R26.2)    Time: 2956-2130 PT Time Calculation (min) (ACUTE ONLY): 34 min   Charges:   PT Evaluation $PT Eval Moderate Complexity: 1 Mod PT Treatments $Therapeutic Activity: 8-22 mins        D. Royetta Asal PT, DPT 08/24/21, 10:43 AM

## 2021-08-24 NOTE — Assessment & Plan Note (Signed)
Most likely secondary to NSTEMI, recent echo with EF of 35 to 40% with regional wall motion abnormalities.  There was some concern of pulmonary edema when chest x-ray was repeated overnight due to worsening shortness of breath.  Received 1 dose of IV Lasix, then held due to bump in creatinine. Clinically appears euvolemic. -Holding diuretic at this time-need to start her on low-dose Lasix on discharge. -Strict intake and output -Daily weight and BMP -Patient needs ischemic work-up-most likely will be done as an outpatient

## 2021-08-24 NOTE — Assessment & Plan Note (Addendum)
Met sepsis criteria with fever, tachycardia, tachypnea, leukocytosis and respiratory viral panel positive for metapneumovirus.  He received IV antibiotics which were later discontinued as initial procalcitonin was negative. Overnight became febrile again with worsening shortness of breath.  Repeat chest x-ray with bilateral infiltrates/pulmonary edema.  Procalcitonin at 0.44>>0.43>.0.27 .  Oncology ordered CMV and cytomegalovirus labs.  Infectious disease ordered cryptococcal labs.  IgG levels were also ordered.  Sputum culture with normal flora. -Continue ceftriaxone and Zithromax for concern of superadded bacterial pneumonia and complete a 5-day course  -Currently on acyclovir -Follow-up pending labs

## 2021-08-25 ENCOUNTER — Encounter: Payer: Self-pay | Admitting: Internal Medicine

## 2021-08-25 DIAGNOSIS — I350 Nonrheumatic aortic (valve) stenosis: Secondary | ICD-10-CM | POA: Diagnosis not present

## 2021-08-25 DIAGNOSIS — I429 Cardiomyopathy, unspecified: Secondary | ICD-10-CM | POA: Diagnosis not present

## 2021-08-25 DIAGNOSIS — R509 Fever, unspecified: Secondary | ICD-10-CM | POA: Diagnosis not present

## 2021-08-25 DIAGNOSIS — A419 Sepsis, unspecified organism: Secondary | ICD-10-CM | POA: Diagnosis not present

## 2021-08-25 DIAGNOSIS — M48062 Spinal stenosis, lumbar region with neurogenic claudication: Secondary | ICD-10-CM

## 2021-08-25 DIAGNOSIS — I493 Ventricular premature depolarization: Secondary | ICD-10-CM

## 2021-08-25 DIAGNOSIS — I5021 Acute systolic (congestive) heart failure: Secondary | ICD-10-CM | POA: Diagnosis not present

## 2021-08-25 DIAGNOSIS — I214 Non-ST elevation (NSTEMI) myocardial infarction: Secondary | ICD-10-CM | POA: Diagnosis not present

## 2021-08-25 LAB — TOXOPLASMA ANTIBODIES- IGG AND  IGM
Toxoplasma Antibody- IgM: <3 AU/mL (ref 0.0–7.9)
Toxoplasma IgG Ratio: 3.2 IU/mL (ref 0.0–7.1)

## 2021-08-25 LAB — GLUCOSE, CAPILLARY
Glucose-Capillary: 135 mg/dL — ABNORMAL HIGH (ref 70–99)
Glucose-Capillary: 125 mg/dL — ABNORMAL HIGH (ref 70–99)

## 2021-08-25 LAB — IGG, IGA, IGM
IgA: 14 mg/dL — ABNORMAL LOW (ref 87–352)
IgG (Immunoglobin G), Serum: 584 mg/dL — ABNORMAL LOW (ref 586–1602)
IgM (Immunoglobulin M), Srm: 10 mg/dL — ABNORMAL LOW (ref 26–217)

## 2021-08-25 MED ORDER — AMOXICILLIN-POT CLAVULANATE 875-125 MG PO TABS: 1.0000 | ORAL_TABLET | Freq: Two times a day (BID) | ORAL | 0 refills | Status: AC

## 2021-08-25 MED ORDER — GUAIFENESIN-DM 100-10 MG/5ML PO SYRP: 5.0000 mL | ORAL_SOLUTION | ORAL | 0 refills | Status: DC | PRN

## 2021-08-25 MED ORDER — AZITHROMYCIN 250 MG PO TABS: ORAL_TABLET | ORAL | 0 refills | Status: DC

## 2021-08-25 MED ORDER — TRAMADOL HCL 50 MG PO TABS: 50.0000 mg | ORAL_TABLET | Freq: Two times a day (BID) | ORAL | 0 refills | Status: DC | PRN

## 2021-08-25 MED ORDER — IPRATROPIUM-ALBUTEROL 0.5-2.5 (3) MG/3ML IN SOLN
3.0000 mL | Freq: Three times a day (TID) | RESPIRATORY_TRACT | Status: DC
  Administered 2021-08-25 (×2): 3 mL via RESPIRATORY_TRACT
  Filled 2021-08-25 (×2): qty 3

## 2021-08-25 MED ORDER — ENSURE ENLIVE PO LIQD: 237.0000 mL | Freq: Two times a day (BID) | ORAL | 12 refills | Status: DC

## 2021-08-25 MED ORDER — MAGNESIUM CHLORIDE 64 MG PO TBEC: 1.0000 | DELAYED_RELEASE_TABLET | Freq: Two times a day (BID) | ORAL | 1 refills | Status: DC

## 2021-08-25 MED ORDER — NITROGLYCERIN 0.4 MG SL SUBL: 0.4000 mg | SUBLINGUAL_TABLET | SUBLINGUAL | 12 refills | Status: DC | PRN

## 2021-08-25 MED ORDER — BISOPROLOL FUMARATE 5 MG PO TABS
5.0000 mg | ORAL_TABLET | Freq: Every day | ORAL | Status: DC
  Administered 2021-08-25: 5 mg via ORAL
  Filled 2021-08-25: qty 1

## 2021-08-25 MED ORDER — ISOSORBIDE MONONITRATE ER 30 MG PO TB24: 30.0000 mg | ORAL_TABLET | Freq: Every day | ORAL | 0 refills | Status: DC

## 2021-08-25 NOTE — Progress Notes (Signed)
PIV removed. Discharge instructions completed. Patient verbalized understanding of medication regimen, follow up appointments and discharge instructions. Patient belongings gathered and packed to discharge.  

## 2021-08-25 NOTE — Discharge Summary (Signed)
Physician Discharge Summary   Patient: Sarah [name] MRN: [id number] DOB: 08-Oct-1955  Admit date:     08/18/2021  Discharge date: 08/25/21  Discharge Physician: Lorella Nimrod   PCP: Glean Hess, MD   Recommendations at discharge:  Follow-up with oncology within a week Follow-up with cardiology within a week Follow-up with primary care provider Follow-up of pending labs which include histoplasma, CMV, EBV, Aspergillus.  Discharge Diagnoses: Principal Problem:   Sepsis ([place]) Active Problems:   NSTEMI (non-ST elevated myocardial infarction) ([place])   Multiple myeloma not having achieved remission (HCC)   Acute HFrEF (heart failure with reduced ejection fraction) (HCC)   Thrombocytopenia (HCC)   Type II diabetes mellitus with complication ([place])   Aortic valve stenosis   Spinal stenosis of lumbar region with neurogenic claudication   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[place])   Long term prescription opiate use   UTI (urinary tract infection)   Bronchitis   Fever   Low immunoglobulin level   Cardiomyopathy [facility])   Hospital Course: Taken from prior notes.  Sarah [name] is a 66 y.o. female with a PMH significant for Multiple myeloma s/p autologous stem cell transplant and on maintenance chemotherapy , asthma, DM,  severe nonrheumatic aortic valve stenosis followed by [facility] cardiology, thrombocytopenia, chronic pain from spinal stenosis , chemotherapy induced neuropathy and diarrhea hospitalized in September 2022 with possible CAP and E. faecalis UTI, after presenting with fever, and again in December 2022 with URI ruled out for sepsis.  They presented from home to the ED on 08/18/2021 with fever of 103, cough, sore throat, hoarseness x 2 days.   Patient met sepsis criteria with fever of 102.9, tachycardia, tachypnea, most likely secondary to viral cause as respiratory viral panel positive for metapneumovirus.  Overnight developed chest pain with shortness of breath,  elevated troponin which peaked at 2239. EKG with new LBBB, echocardiogram with new onset reduced EF of 30 to 35% and regional wall motion abnormalities, consistent with NSTEMI.  Cardiology was consulted and she was placed on heparin infusion, pending further ischemic evaluation.  2/22: Patient became more short of breath and febrile overnight.  She also developed more chest pain and at times becoming confused persisted.  Able to answer questions appropriately and started today.  Troponin trending down, procalcitonin started going up at 0.4 for now. Chest x-ray with some bilateral infiltrate, received a dose of IV Lasix for concern of pulmonary edema-mild worsening of renal function. Started her on CAP coverage, for concern of superadded bacterial infection.  2/23: Patient continued to have some cough but chest pain improved.  Now on room air. She wants to get established with [facility] cardiology, stating that she does not connect with the current cardiologist and would also like to switch from [facility] cardiology too as it is becoming harder for her to go over there.  Message sent to Dr. Rockey Situ, they will see her as outpatient.  2/24: Patient had another febrile episode yesterday evening, fever of 103.3.  Little difficult situation as she is allergic to Tylenol and oncology does not want her to take any Advil.  She was given 1 time dose of Advil with cooling blanket.  Remained afebrile this morning.  Heparin infusion was finished overnight after completing 72 hours and cardiology signed off. Patient wants to follow-up with [facility] group as an outpatient.  Patient was stable and afebrile today.  She wants to go home.  We discussed about having a close follow-up with her oncologist and  cardiologist as multiple labs are pending. She is being discharged on 2 more days of Augmentin and Zithromax for concern of superadded bacterial infection on viral bronchitis.  Infectious disease was also consulted due  to persistent fever and her immunocompromise status.  No obvious source of infection identified except possible superadded bacterial infection with bronchitis.  Viral panel was positive for metapneumovirus.  She recently received a lumbar injection for chronic lumbar stenosis and claudications.  No lumbar pain or tenderness noted, less likely causing infection. She has some chronic cervical radiculopathy for which she needs to follow-up with her orthopedic surgery.  Patient has new onset HFrEF with NSTEMI.  Because of AKI and low blood pressure, cardiology just decided to continue with bisoprolol and they will decide about ACE inhibitor/ARB/Entresto as an outpatient.  Patient also had severe aortic stenosis and needs a work-up for TAVR.  She needs to follow-up either with her on cardiologist at [facility] or with [place] cardiology group according to her wishes.  Patient received conservative management for NSTEMI with heparin of 72 hours.  Patient need outpatient ischemic work-up.  Her repeat IVIG levels are still below normal limit but improved from before, he she did received 1 transfusion while in the hospital.  Patient will continue with rest of her home medications and follow-up with her providers.  Assessment and Plan: * Sepsis ([place])- (present on admission) Met sepsis criteria with fever, tachycardia, tachypnea, leukocytosis and respiratory viral panel positive for metapneumovirus.  He received IV antibiotics which were later discontinued as initial procalcitonin was negative. Overnight became febrile again with worsening shortness of breath.  Repeat chest x-ray with bilateral infiltrates/pulmonary edema.  Procalcitonin at 0.44>>0.43>.0.27 .  Oncology ordered CMV and cytomegalovirus labs.  Infectious disease ordered cryptococcal labs.  IgG levels were also ordered.  Sputum culture with normal flora. -Continue ceftriaxone and Zithromax for concern of superadded bacterial pneumonia and complete a  5-day course  -Currently on acyclovir -Follow-up pending labs  NSTEMI (non-ST elevated myocardial infarction) [facility]) Patient developed chest pain with shortness of breath.  Elevated troponin which peaked at 2239 and now continue to trend down.  EKG with new LBBB and echocardiogram with newly depressed EF with regional wall motion abnormalities consistent with NSTEMI. Cardiology was consulted and she was started on heparin infusion. -Completed 72 hours of heparin infusion and remained chest pain-free. -Continue with metoprolol-cardiology switched from home bisoprolol. -Continue with aspirin and statin -Imdur was added today -Patient will need further ischemic work-up-I will defer that decision to cardiology -Patient would like to follow-up with [facility] cardiology group as an outpatient for further recommendations.  Acute HFrEF (heart failure with reduced ejection fraction) ([place])- (present on admission) Most likely secondary to NSTEMI, recent echo with EF of 35 to 40% with regional wall motion abnormalities.  There was some concern of pulmonary edema when chest x-ray was repeated overnight due to worsening shortness of breath.  Received 1 dose of IV Lasix, then held due to bump in creatinine. Clinically appears euvolemic. -Holding diuretic at this time-need to start her on low-dose Lasix on discharge. -Strict intake and output -Daily weight and BMP -Patient needs ischemic work-up-most likely will be done as an outpatient  Multiple myeloma not having achieved remission ([place])- (present on admission) S/p autologous bone marrow transplant. Oncology on board and they were holding maintenance injections of daratumumab due to acute infection. -Continue to monitor  Thrombocytopenia ([place])- (present on admission) Acute on chronic thrombocytopenia, likely due to acute infection.  Improving,  platelet at 109 today -Continue to monitor -Oncology on board  Type II diabetes mellitus with complication  ([place])- (present on admission) CBG elevated. -Continue with SSI -Add Semglee 5 units twice daily -Add 3 units for mealtime coverage -Continue to monitor  Aortic valve stenosis Echocardiogram with bicuspid aortic valve. Being managed by [facility] cardiology.  Spinal stenosis of lumbar region with neurogenic claudication- (present on admission) Recently received transforaminal epidural steroid injection as outpatient. -Continue with pain management -Continue with Lyrica  Low immunoglobulin level- (present on admission) Received a dose of IVIG -Repeat immunoglobulin levels were ordered by oncology-pending results   Consultants: Cardiology, infectious disease, oncology Procedures performed: None  Disposition: Home Diet recommendation:  Discharge Diet Orders (From admission, onward)     Start     Ordered   08/25/21 0000  Diet - low sodium heart healthy        08/25/21 1446           Cardiac and Carb modified diet  DISCHARGE MEDICATION: Allergies as of 08/25/2021       Reactions   Oxycodone-acetaminophen Anaphylaxis   Swelling and rash   Celebrex [celecoxib] Diarrhea   Codeine    Plerixafor    In 2016 during ASCT collection patient developed fever to 103.46F and required hospitalization   Benadryl [diphenhydramine] Palpitations   Morphine Itching, Rash   Ondansetron Diarrhea   Tylenol [acetaminophen] Itching, Rash        Medication List     STOP taking these medications    sucralfate 1 g tablet Commonly known as: Carafate       TAKE these medications    acyclovir 400 MG tablet Commonly known as: ZOVIRAX Take 1 tablet (400 mg total) by mouth 2 (two) times daily.   albuterol 108 (90 Base) MCG/ACT inhaler Commonly known as: VENTOLIN HFA Inhale 2 puffs into the lungs every 6 (six) hours as needed for wheezing or shortness of breath.   allopurinol 100 MG tablet Commonly known as: ZYLOPRIM TAKE 1 TABLET(100 MG) BY MOUTH DAILY as needed    amoxicillin-clavulanate 875-125 MG tablet Commonly known as: AUGMENTIN Take 1 tablet by mouth 2 (two) times daily for 2 days.   azithromycin 250 MG tablet Commonly known as: ZITHROMAX Take daily for next 2 days Start taking on: August 26, 2021   bisoprolol 5 MG tablet Commonly known as: ZEBETA Take 5 mg by mouth daily.   diclofenac sodium 1 % Gel Commonly known as: VOLTAREN Apply 2 g topically 4 (four) times daily.   feeding supplement Liqd Take 237 mLs by mouth 2 (two) times daily between meals. Start taking on: August 26, 2021   fluconazole 150 MG tablet Commonly known as: DIFLUCAN Take 1 tablet (150 mg total) by mouth once as needed for up to 1 dose (vaginal yeast infection symptoms).   FLUoxetine 40 MG capsule Commonly known as: PROZAC TAKE 1 CAPSULE EVERY DAY   fluticasone 50 MCG/ACT nasal spray Commonly known as: FLONASE Place 2 sprays into both nostrils daily.   glucose blood test strip   guaiFENesin-dextromethorphan 100-10 MG/5ML syrup Commonly known as: ROBITUSSIN DM Take 5 mLs by mouth every 4 (four) hours as needed for cough.   isosorbide mononitrate 30 MG 24 hr tablet Commonly known as: IMDUR Take 1 tablet (30 mg total) by mouth daily. Start taking on: August 26, 2021   magnesium chloride 64 MG Tbec SR tablet Commonly known as: SLOW-MAG Take 1 tablet (64 mg total) by mouth 2 (two) times daily.  montelukast 10 MG tablet Commonly known as: Singulair Take 1 tablet (10 mg total) by mouth See admin instructions. Take 1 tab daily for 2 days after daratumumab treatments   multivitamin tablet Take 1 tablet by mouth daily.   nitroGLYCERIN 0.4 MG SL tablet Commonly known as: NITROSTAT Place 1 tablet (0.4 mg total) under the tongue every 5 (five) minutes as needed for chest pain.   omeprazole 40 MG capsule Commonly known as: PRILOSEC TAKE 1 CAPSULE (40 MG TOTAL) BY MOUTH IN THE MORNING AND AT BEDTIME.   promethazine 25 MG tablet Commonly known  as: PHENERGAN Take 1 tablet (25 mg total) by mouth every 6 (six) hours as needed for nausea or vomiting.   tiZANidine 2 MG tablet Commonly known as: ZANAFLEX Take 1 tablet (2 mg total) by mouth every 6 (six) hours as needed for muscle spasms.   traMADol 50 MG tablet Commonly known as: ULTRAM Take 1 tablet (50 mg total) by mouth every 12 (twelve) hours as needed for moderate pain or severe pain.   traZODone 100 MG tablet Commonly known as: DESYREL TAKE 1 TABLET AT BEDTIME        Follow-up Information     Glean Hess, MD. Schedule an appointment as soon as possible for a visit in 1 week(s).   Specialty: Internal Medicine Contact information: [address] [place] [address] [phone]         Earlie Server, MD. Schedule an appointment as soon as possible for a visit in 1 week(s).   Specialty: Oncology Contact information: [place] [address] [phone]         Minna Merritts, MD. Schedule an appointment as soon as possible for a visit in 1 week(s).   Specialty: Cardiology Contact information: [place] [address] [phone]                 Discharge Exam: Danley Danker Weights   08/18/21 1723 08/19/21 0103  Weight: 72.6 kg 70.6 kg   General.     In no acute distress. Pulmonary.  Lungs clear bilaterally, normal respiratory effort. CV.  Regular rate and rhythm, no JVD, rub or murmur. Abdomen.  Soft, nontender, nondistended, BS positive. CNS.  Alert and oriented .  No focal neurologic deficit. Extremities.  No edema, no cyanosis, pulses intact and symmetrical. Psychiatry.  Judgment and insight appears normal.   Condition at discharge: stable  The results of significant diagnostics from this hospitalization (including imaging, microbiology, ancillary and laboratory) are listed below for reference.   Imaging Studies: DG Chest 1 View  Result Date: 08/21/2021 CLINICAL DATA:   Dyspnea. EXAM: CHEST  1 VIEW COMPARISON:  Radiograph 08/20/2021, yesterday.  CT 08/18/2021 FINDINGS: Right chest port in place. Stable heart size and mediastinal contours. Vascular congestion and perivascular haziness suspicious for pulmonary edema, not significantly changed from prior. Suspected small bilateral pleural effusions are similar. Small perifissural right upper lobe opacity. No pneumothorax. IMPRESSION: Findings suspicious for pulmonary edema and small pleural effusions. Perifissural opacity in the right upper lobe may be related to fluid in the fissure or adjacent airspace disease. Electronically Signed   By: Keith Rake M.D.   On: 08/21/2021 22:49   DG Chest 1 View  Result Date: 08/19/2021 CLINICAL DATA:  Dyspnea. EXAM: CHEST  1 VIEW COMPARISON:  August 18, 2021 FINDINGS: There is stable right-sided venous Port-A-Cath positioning. The heart size and mediastinal contours are within normal limits. Both lungs are  clear. Multilevel degenerative changes are seen throughout the thoracic spine. IMPRESSION: No active disease. Electronically Signed   By: Virgina Norfolk M.D.   On: 08/19/2021 21:19   DG Chest 2 View  Result Date: 08/18/2021 CLINICAL DATA:  Shortness of breath, cough, fever, suspected sepsis, multiple myeloma EXAM: CHEST - 2 VIEW COMPARISON:  08/17/2021 FINDINGS: The heart size and mediastinal contours are within normal limits. Both lungs are clear. Disc degenerative disease of the thoracic spine. IMPRESSION: No acute abnormality of the lungs. Electronically Signed   By: Delanna Ahmadi M.D.   On: 08/18/2021 18:13   DG Chest 2 View  Result Date: 08/17/2021 CLINICAL DATA:  Cough and low-grade fever. Multiple myeloma. Port in place. EXAM: CHEST - 2 VIEW COMPARISON:  06/23/2021 FINDINGS: Right chest wall porta catheter tip overlies the superior vena cava/right atrial junction. Cardiac silhouette and mediastinal contours are within normal limits. The lungs are clear. No pleural  effusion or pneumothorax. Mild-to-moderate multilevel degenerative disc changes of the thoracic spine. Cholecystectomy clips. IMPRESSION: No active cardiopulmonary disease. Electronically Signed   By: Yvonne Kendall M.D.   On: 08/17/2021 13:56   CT Angio Chest PE W and/or Wo Contrast  Result Date: 08/18/2021 CLINICAL DATA:  Pulmonary embolism suspected, high probability. Shortness of breath and fever beginning 3 days ago. EXAM: CT ANGIOGRAPHY CHEST WITH CONTRAST TECHNIQUE: Multidetector CT imaging of the chest was performed using the standard protocol during bolus administration of intravenous contrast. Multiplanar CT image reconstructions and MIPs were obtained to evaluate the vascular anatomy. RADIATION DOSE REDUCTION: This exam was performed according to the departmental dose-optimization program which includes automated exposure control, adjustment of the mA and/or kV according to patient size and/or use of iterative reconstruction technique. CONTRAST:  51m OMNIPAQUE IOHEXOL 350 MG/ML SOLN COMPARISON:  Chest radiography same day. Prior CT angiography 03/17/2020 FINDINGS: Cardiovascular: The heart is enlarged. No pericardial fluid. No visible coronary artery calcification or aortic atherosclerotic calcification. Pulmonary arterial opacification is moderate. No pulmonary emboli are seen. Mediastinum/Nodes: Chronic calcified mediastinal lymph nodes as noted previously. Lungs/Pleura: No pulmonary infiltrate, collapse or effusion. Patient does have some scattered areas of bronchial thickening. No pleural effusion. Upper Abdomen: Fatty change of the liver. No focal lesion is visible. Previous cholecystectomy. Musculoskeletal: Negative Review of the MIP images confirms the above findings. IMPRESSION: No pulmonary emboli. Mild cardiomegaly as seen previously. Possible bronchitis pattern. No pulmonary consolidation or collapse. No pleural effusion. Chronic calcified mediastinal lymph nodes. Electronically Signed    By: MNelson ChimesM.D.   On: 08/18/2021 20:08   DG Chest Port 1 View  Result Date: 08/20/2021 CLINICAL DATA:  Shortness of breath, cough EXAM: PORTABLE CHEST 1 VIEW COMPARISON:  Previous studies including the examination of 08/19/2021 FINDINGS: Transverse diameter of heart is within normal limits. Tip of right IJ chest port is seen in the superior vena cava close to the right atrium. Central pulmonary vessels are more prominent. There is interval increase in interstitial markings in both parahilar regions and both lower lung fields. There is no focal consolidation. There is minimal blunting of lateral CP angles. There is no pneumothorax. IMPRESSION: Central pulmonary vessels are more prominent suggesting CHF. Increased interstitial markings are seen in both lungs suggesting interstitial pulmonary edema. Less likely possibility would be interstitial pneumonia. Minimal bilateral pleural effusions. Electronically Signed   By: PElmer PickerM.D.   On: 08/20/2021 17:59   ECHOCARDIOGRAM COMPLETE  Result Date: 08/21/2021    ECHOCARDIOGRAM REPORT   [facility] Date of Exam: 08/21/2021 Medical Rec #:  [id number]        Height:       62.0 in Accession #:    [id number]       Weight:       155.6 lb Date of Birth:  [birth year]         BSA:          1.718 m Patient Age:    34 years         BP:           138/85 mmHg Patient Gender: F                HR:           91 bpm. Exam Location:  [facility] Procedure: 2D Echo, Cardiac Doppler and Color Doppler Indications:     Chest pain R07.9  History:         Patient has prior history of Echocardiogram examinations, most                  recent 11/16/2019. Cardiomyopathy; Risk Factors:Diabetes and                  Hypertension. Bicuspid AOV.  Sonographer:     Sherrie Sport Referring Phys:  [id number] [place]ra [place] L FOUST Diagnosing Phys: Yolonda Kida MD  Sonographer Comments: Suboptimal apical window. IMPRESSIONS  1. Ant/Apical/Septal/High Lateral Hypo.  2. Bicusp Severe AS.   3. Left ventricular ejection fraction, by estimation, is 35 to 40%. The left ventricle has moderately decreased function. The left ventricle demonstrates regional wall motion abnormalities (see scoring diagram/findings for description). Left ventricular  diastolic parameters were normal.  4. Right ventricular systolic function is normal. The right ventricular size is normal.  5. Left atrial size was mildly dilated.  6. The mitral valve is normal in structure. Mild mitral valve regurgitation.  7. The aortic valve is bicuspid. There is moderate calcification of the aortic valve. There is moderate thickening of the aortic valve. Aortic valve regurgitation is trivial. Severe aortic valve stenosis. FINDINGS  Left Ventricle: Left ventricular ejection fraction, by estimation, is 35 to 40%. The left ventricle has moderately decreased function. The left ventricle demonstrates regional wall motion abnormalities. The left ventricular internal cavity size was normal in size. There is no left ventricular hypertrophy. Left ventricular diastolic parameters were normal. Right Ventricle: The right ventricular size is normal. No increase in right ventricular wall thickness. Right ventricular systolic function is normal. Left Atrium: Left atrial size was mildly dilated. Right Atrium: Right atrial size was normal in size. Pericardium: There is no evidence of pericardial effusion. Mitral Valve: The mitral valve is normal in structure. Mild mitral valve regurgitation. MV peak gradient, 4.7 mmHg. The mean mitral valve gradient is 2.0 mmHg. Tricuspid Valve: The tricuspid valve is normal in structure. Tricuspid valve regurgitation is mild. Aortic Valve: The aortic valve is bicuspid. There is moderate calcification of the aortic valve. There is moderate thickening of the aortic valve. There is moderate aortic valve annular calcification. Aortic valve regurgitation is trivial. Severe aortic stenosis is present. Aortic valve mean gradient  measures 14.7 mmHg. Aortic valve peak gradient measures 24.5 mmHg. Aortic valve area, by VTI measures 0.56 cm. Pulmonic Valve: The pulmonic valve was normal in structure. Pulmonic valve regurgitation is trivial. Aorta: The ascending aorta was not well visualized. IAS/Shunts: No atrial level shunt detected by color flow Doppler. Additional Comments: Ant/Apical/Septal/High Lateral Hypo. Bicusp Severe AS.  LEFT VENTRICLE  PLAX 2D LVIDd:         4.50 cm     Diastology LVIDs:         3.30 cm     LV e' medial:    5.66 cm/s LV PW:         1.00 cm     LV E/e' medial:  18.4 LV IVS:        0.80 cm     LV e' lateral:   13.50 cm/s LVOT diam:     2.00 cm     LV E/e' lateral: 7.7 LV SV:         28 LV SV Index:   16 LVOT Area:     3.14 cm  LV Volumes (MOD) LV vol d, MOD A2C: 90.9 ml LV vol d, MOD A4C: 61.0 ml LV vol s, MOD A2C: 63.3 ml LV vol s, MOD A4C: 33.7 ml LV SV MOD A2C:     27.6 ml LV SV MOD A4C:     61.0 ml LV SV MOD BP:      29.5 ml RIGHT VENTRICLE RV Basal diam:  2.50 cm RV S prime:     12.10 cm/s TAPSE (M-mode): 2.6 cm LEFT ATRIUM             Index        RIGHT ATRIUM           Index LA diam:        4.40 cm 2.56 cm/m   RA Area:     16.00 cm LA Vol (A2C):   72.6 ml 42.25 ml/m  RA Volume:   41.80 ml  24.32 ml/m LA Vol (A4C):   52.6 ml 30.61 ml/m LA Biplane Vol: 64.0 ml 37.24 ml/m  AORTIC VALVE AV Area (Vmax):    0.63 cm AV Area (Vmean):   0.51 cm AV Area (VTI):     0.56 cm AV Vmax:           247.67 cm/s AV Vmean:          175.667 cm/s AV VTI:            0.503 m AV Peak Grad:      24.5 mmHg AV Mean Grad:      14.7 mmHg LVOT Vmax:         49.60 cm/s LVOT Vmean:        28.600 cm/s LVOT VTI:          0.090 m LVOT/AV VTI ratio: 0.18  AORTA Ao Root diam: 2.97 cm MITRAL VALVE                TRICUSPID VALVE MV Area (PHT): 4.33 cm     TR Peak grad:   29.2 mmHg MV Area VTI:   0.87 cm     TR Vmax:        270.00 cm/s MV Peak grad:  4.7 mmHg MV Mean grad:  2.0 mmHg     SHUNTS MV Vmax:       1.08 m/s     Systemic VTI:  0.09  m MV Vmean:      69.6 cm/s    Systemic Diam: 2.00 cm MV Decel Time: 175 msec MV E velocity: 104.00 cm/s MV A velocity: 86.50 cm/s MV E/A ratio:  1.20 Dwayne Prince Rome MD Electronically signed by Yolonda Kida MD Signature Date/Time: 08/21/2021/4:07:57 PM    Final     Microbiology: Results for orders placed or performed during the  hospital encounter of 08/18/21  Culture, blood (Routine x 2)     Status: None   Collection Time: 08/18/21  5:31 PM   Specimen: BLOOD  Result Value Ref Range Status   Specimen Description BLOOD BLOOD RIGHT FOREARM  Final   Special Requests   Final    BOTTLES DRAWN AEROBIC AND ANAEROBIC Blood Culture adequate volume   Culture   Final    NO GROWTH 5 DAYS Performed at [facility] [facility], [address], [place], [place] [address]    Report Status 08/23/2021 FINAL  Final  Culture, blood (Routine x 2)     Status: None   Collection Time: 08/18/21  6:41 PM   Specimen: BLOOD  Result Value Ref Range Status   Specimen Description BLOOD RIGHT ANTECUBITAL  Final   Special Requests   Final    BOTTLES DRAWN AEROBIC AND ANAEROBIC Blood Culture adequate volume   Culture   Final    NO GROWTH 5 DAYS Performed at [facility], [address] [place], [place] [address]    Report Status 08/23/2021 FINAL  Final  Resp Panel by RT-PCR (Flu A&B, Covid) Nasopharyngeal Swab     Status: None   Collection Time: 08/18/21  7:36 PM   Specimen: Nasopharyngeal Swab; Nasopharyngeal(NP) swabs in vial transport medium  Result Value Ref Range Status   SARS Coronavirus 2 by RT PCR NEGATIVE NEGATIVE Final    Comment: (NOTE) SARS-CoV-2 target nucleic acids are NOT DETECTED.  The SARS-CoV-2 RNA is generally detectable in upper respiratory specimens during the acute phase of infection. The lowest concentration of SARS-CoV-2 viral copies this assay can detect is 138 copies/mL. A negative result does not preclude SARS-Cov-2 infection and should not be used as the sole basis for  treatment or other patient management decisions. A negative result may occur with  improper specimen collection/handling, submission of specimen other than nasopharyngeal swab, presence of viral mutation(s) within the areas targeted by this assay, and inadequate number of viral copies(<138 copies/mL). A negative result must be combined with clinical observations, patient history, and epidemiological information. The expected result is Negative.  Fact Sheet for Patients:  EntrepreneurPulse.com.au  Fact Sheet for Healthcare Providers:  IncredibleEmployment.be  This test is no t yet approved or cleared by the [place] FDA and  has been authorized for detection and/or diagnosis of SARS-CoV-2 by FDA under an Emergency Use Authorization (EUA). This EUA will remain  in effect (meaning this test can be used) for the duration of the COVID-19 declaration under Section 564(b)(1) of the Act, 21 U.S.C.section 360bbb-3(b)(1), unless the authorization is terminated  or revoked sooner.       Influenza A by PCR NEGATIVE NEGATIVE Final   Influenza B by PCR NEGATIVE NEGATIVE Final    Comment: (NOTE) The Xpert Xpress SARS-CoV-2/FLU/RSV plus assay is intended as an aid in the diagnosis of influenza from Nasopharyngeal swab specimens and should not be used as a sole basis for treatment. Nasal washings and aspirates are unacceptable for Xpert Xpress SARS-CoV-2/FLU/RSV testing.  Fact Sheet for Patients: EntrepreneurPulse.com.au  Fact Sheet for Healthcare Providers: IncredibleEmployment.be  This test is not yet approved or cleared by the [place] FDA and has been authorized for detection and/or diagnosis of SARS-CoV-2 by FDA under an Emergency Use Authorization (EUA). This EUA will remain in effect (meaning this test can be used) for the duration of the COVID-19 declaration under Section 564(b)(1) of the Act, 21  U.S.C. section 360bbb-3(b)(1), unless the authorization is terminated or  revoked.  Performed at [facility], [address], [place], [place] [address]   Urine Culture     Status: Abnormal   Collection Time: 08/18/21 10:30 PM   Specimen: Urine, Clean Catch  Result Value Ref Range Status   Specimen Description   Final    URINE, CLEAN CATCH Performed at [facility], [address] [place], [place] [address]    Special Requests   Final    NONE Performed at [facility], [address] [place], [place] [address]    Culture MULTIPLE SPECIES PRESENT, SUGGEST RECOLLECTION (A)  Final   Report Status 08/20/2021 FINAL  Final  Expectorated Sputum Assessment w Gram Stain, Rflx to Resp Cult     Status: None   Collection Time: 08/19/21  8:03 AM   Specimen: Sputum  Result Value Ref Range Status   Specimen Description SPUTUM  Final   Special Requests NONE  Final   Sputum evaluation   Final    THIS SPECIMEN IS ACCEPTABLE FOR SPUTUM CULTURE Performed at [facility], [address] [place], [place] [address]    Report Status 08/19/2021 FINAL  Final  Culture, Respiratory w Gram Stain     Status: None   Collection Time: 08/19/21  8:03 AM   Specimen: SPU  Result Value Ref Range Status   Specimen Description   Final    SPUTUM Performed at [facility], [address], [place], [place] [address]    Special Requests   Final    NONE Reflexed from [id number] Performed at [facility], [place], [place] [address]    Gram Stain   Final    RARE SQUAMOUS EPITHELIAL CELLS PRESENT MODERATE WBC PRESENT,BOTH PMN AND MONONUCLEAR RARE GRAM POSITIVE COCCI RARE GRAM POSITIVE RODS    Culture   Final    FEW Normal respiratory flora-no Staph aureus or Pseudomonas seen Performed at [facility], [place] [address] [place], [place] [address]    Report Status 08/21/2021 FINAL  Final  Respiratory (~20 pathogens) panel by PCR      Status: Abnormal   Collection Time: 08/20/21  7:29 AM   Specimen: Nasopharyngeal Swab; Respiratory  Result Value Ref Range Status   Adenovirus NOT DETECTED NOT DETECTED Final   Coronavirus 229E NOT DETECTED NOT DETECTED Final    Comment: (NOTE) The Coronavirus on the Respiratory Panel, DOES NOT test for the novel  Coronavirus (2019 nCoV)    Coronavirus HKU1 NOT DETECTED NOT DETECTED Final   Coronavirus NL63 NOT DETECTED NOT DETECTED Final   Coronavirus OC43 NOT DETECTED NOT DETECTED Final   Metapneumovirus DETECTED (A) NOT DETECTED Final   Rhinovirus / Enterovirus NOT DETECTED NOT DETECTED Final   Influenza A NOT DETECTED NOT DETECTED Final   Influenza B NOT DETECTED NOT DETECTED Final   Parainfluenza Virus 1 NOT DETECTED NOT DETECTED Final   Parainfluenza Virus 2 NOT DETECTED NOT DETECTED Final   Parainfluenza Virus 3 NOT DETECTED NOT DETECTED Final   Parainfluenza Virus 4 NOT DETECTED NOT DETECTED Final   Respiratory Syncytial Virus NOT DETECTED NOT DETECTED Final   Bordetella pertussis NOT DETECTED NOT DETECTED Final   Bordetella Parapertussis NOT DETECTED NOT DETECTED Final   Chlamydophila pneumoniae NOT DETECTED NOT DETECTED Final   Mycoplasma pneumoniae NOT DETECTED NOT DETECTED Final    Comment: Performed at [facility], [address] [place], [place] [address]  MRSA Next Gen by PCR, Nasal  Status: None   Collection Time: 08/23/21 12:20 PM   Specimen: Nasal Mucosa; Nasal Swab  Result Value Ref Range Status   MRSA by PCR Next Gen NOT DETECTED NOT DETECTED Final    Comment: (NOTE) The GeneXpert MRSA Assay (FDA approved for NASAL specimens only), is one component of a comprehensive MRSA colonization surveillance program. It is not intended to diagnose MRSA infection nor to guide or monitor treatment for MRSA infections. Test performance is not FDA approved in patients less than 48 years old. Performed at [facility], [address]  [place], [place] [address]    *Note: Due to a large number of results and/or encounters for the requested time period, some results have not been displayed. A complete set of results can be found in Results Review.    Labs: CBC: Recent Labs  Lab 08/18/21 1731 08/20/21 0452 08/21/21 0505 08/22/21 0533 08/23/21 0220 08/24/21 0530  WBC 7.7 6.1 6.5 9.1 7.7 7.2  NEUTROABS 6.0  --   --   --   --   --   HGB 12.1 9.7* 10.4* 10.4* 10.1* 9.0*  HCT 35.9* 30.0* 31.8* 30.9* 30.0* 27.4*  MCV 80.9 82.9 82.4 80.3 80.4 81.1  PLT 93* 68* 89* 102* 109* 563*   Basic Metabolic Panel: Recent Labs  Lab 08/21/21 0505 08/22/21 0533 08/23/21 0056 08/23/21 0220 08/24/21 0530  NA 136 134* 134* 132* 133*  K 3.7 3.6 3.8 4.0 3.4*  CL 103 100 101 101 100  CO2 19* _0 GLUCOSE 255* 151* 187* 173* 112*  BUN 18 32* 26* 25* 24*  CREATININE 1.08* 1.29* 1.06* 1.12* 1.14*  CALCIUM 8.0* 8.1* 8.1* 8.0* 8.3*  MG  --  1.1* 2.0  --   --    Liver Function Tests: Recent Labs  Lab 08/18/21 1731 08/24/21 0530  AST 37 34  ALT 57* 33  ALKPHOS 92 78  BILITOT 0.5 0.5  PROT 6.7 6.2*  ALBUMIN 4.2 2.8*   CBG: Recent Labs  Lab 08/24/21 1244 08/24/21 1638 08/24/21 2049 08/25/21 0806 08/25/21 1207  GLUCAP 129* 99 152* 135* 125*    Discharge time spent: greater than 30 minutes.  Signed: Lorella Nimrod, MD [facility] 08/25/2021

## 2021-08-25 NOTE — Consult Note (Addendum)
Cardiology Consult    Patient ID: Sarah [name] MRN: [id number], DOB/AGE: [birth year]   Admit date: 08/18/2021 Date of Consult: 08/25/2021  Primary Physician: Glean Hess, MD Primary Cardiologist: Followed at [facility] - new to [facility]. Requesting Provider: S. Reesa Chew, MD  Patient Profile    Sarah [name] is a 66 y.o. female with a history of multiple myeloma status post bone marrow transplant x2, PSVT status post catheter ablation in 1999, normal coronary arteries on catheterization in 2017, aortic stenosis, functionally bicuspid aortic valve, cardiomyopathy with variable LV function over time, hypertension, diabetes, symptomatic PVCs, stage III chronic kidney disease, anemia, anxiety, GERD, spinal stenosis with chronic pain, thrombocytopenia, and arthritis, who is being seen today for the evaluation of NSTEMI, PVCs, and aortic stenosis at the request of Dr. Reesa Chew.  Past Medical History   Past Medical History:  Diagnosis Date   Anemia    Anxiety    Aortic stenosis    a. 05/2020 Echo: EF 45-50%, sev AS - seen by TAVR team @ [facility] - CTA sugg of tricuspid valve w/ fusing of 2 leaflets-TAVR deferred in setting of acute infxn; b. 07/2020 Echo: EF 50-55%, mod-sev AS; c. 12/2020 Echo: EF>55%. Mod-sev paradoxical low-flow low-gradient AS; d. 08/2021 Echo: EF 35-40%, severe AS, triv AI, mild MR.   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 Variable EF over time; b. 08/2017 Echo: EF 40%; b. 03/2020 Echo: EF 55-60%; c. 05/2020 Echo: EF 45-50%; d. 07/2020 Echo: EF 50-55%; e. 12/2020 Echo: EF>55%; f. 08/2021 Echo: EF 35-40%.   Chronic heart failure with preserved ejection fraction (HFpEF) ([place])    a. Variable EF over time; b. 08/2017 Echo: EF 40%; b. 03/2020 Echo: EF 55-60%; c. 05/2020 Echo: EF 45-50%; d. 07/2020 Echo: EF 50-55%; e. 12/2020 Echo: EF>55%; f. 08/2021 Echo: EF 35-40%, no RV, mildly  dil LA, mild MR, triv AI, severe AS.   CKD (chronic kidney disease) stage 3, GFR 30-59 ml/min (HCC)    Depression    Diabetes mellitus (HCC)    Dizziness    Fatty liver    Frequent falls    GERD (gastroesophageal reflux disease)    Gout    Heart murmur    History of blood transfusion    History of bone marrow transplant ([place])    History of uterine fibroid    Hx of cardiac catheterization    a. 01/2016 Cath [facility] - after abnl nuc): Nl cors.   Hypertension    Hypomagnesemia    IDA (iron deficiency anemia)    Multiple myeloma (HCC)    Personal history of chemotherapy    PSVT (paroxysmal supraventricular tachycardia) ([place])    a. S/p [facility]).   PVC's (premature ventricular contractions)    a. Well-managed w/ bisoprolol in outpt setting.   Renal cyst     Past Surgical History:  Procedure Laterality Date   ABDOMINAL HYSTERECTOMY     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   CARDIAC ELECTROPHYSIOLOGY MAPPING AND ABLATION     CARPAL TUNNEL RELEASE Bilateral    CHOLECYSTECTOMY  2008   COLONOSCOPY WITH PROPOFOL N/A 05/07/2017   Procedure: COLONOSCOPY WITH PROPOFOL;  Surgeon: [name], MD;  Location: [facility] ENDOSCOPY;  Service: Gastroenterology;  Laterality: N/A;   COLONOSCOPY WITH PROPOFOL N/A 12/19/2020   Procedure: COLONOSCOPY WITH PROPOFOL;  Surgeon: [name], MD;  Location: [facility]  ENDOSCOPY;  Service: Gastroenterology;  Laterality: N/A;   ESOPHAGOGASTRODUODENOSCOPY (EGD) WITH PROPOFOL N/A 05/07/2017   Procedure: ESOPHAGOGASTRODUODENOSCOPY (EGD) WITH PROPOFOL;  Surgeon: [name], MD;  Location: [facility] ENDOSCOPY;  Service: Gastroenterology;  Laterality: N/A;   FOOT SURGERY Bilateral    INCONTINENCE SURGERY  2009   INTERSTIM IMPLANT PLACEMENT     other     over active bladder   OTHER SURGICAL HISTORY     bladder stimulator    PARTIAL HYSTERECTOMY  03/1996   fibroids   PORTA CATH INSERTION N/A 03/10/2019   Procedure: PORTA CATH INSERTION;  Surgeon: Algernon Huxley, MD;   Location: [facility] LAB;  Service: Cardiovascular;  Laterality: N/A;   TONSILLECTOMY  2007     Allergies  Allergies  Allergen Reactions   Oxycodone-Acetaminophen Anaphylaxis    Swelling and rash   Celebrex [Celecoxib] Diarrhea   Codeine    Plerixafor     In 2016 during ASCT collection patient developed fever to 103.69F and required hospitalization   Benadryl [Diphenhydramine] Palpitations   Morphine Itching and Rash   Ondansetron Diarrhea   Tylenol [Acetaminophen] Itching and Rash    History of Present Illness    66 year old female with above complex past medical history including multiple myeloma, PSVT status post catheter ablation in 1999, normal coronary arteries on catheterization in 2017, aortic stenosis, functionally bicuspid aortic valve, cardiomyopathy with variable LV function over time, hypertension, symptomatic PVCs, stage III chronic kidney disease, anemia, anxiety, GERD, spinal stenosis with chronic pain, thrombocytopenia, diabetes, and arthritis.  Cardiac history dates back to at least late 1990s, when in the setting of PSVT, she underwent catheter ablation in [place].  More recently, in 2017, in [place], for reasons she does not remember, she underwent stress testing.  This was abnormal.  This was followed by diagnostic catheterization, which showed normal coronary arteries.  She subsequently moved to [facility] and has been followed by [facility] cardiology in the setting of progressive aortic stenosis and monitoring of idiopathic cardiomyopathy in the setting of multiple myeloma and chemotherapy.  EF previously 40% in February 2019 with subsequent improvement to 55 to 60% by echo in September 2021.  In November 2021, she was admitted to [facility] following bone marrow biopsy complicated by strep sanguinous bacteremia/engraftment syndrome, neutropenic fever, dyspnea, pancytopenia requiring transfusions, and diarrhea.  Patient was noted to have frequent PVCs and nonsustained VT.   Echocardiogram during admission showed reduction in LV function to 45 to 85%, grade 2 diastolic dysfunction, and severe aortic stenosis.  She was seen by the structural heart team and underwent pre-TAVR CT, which showed a tricuspid aortic valve with fusing of 2 of the leaflets, making for a functionally bicuspid aortic valve.  Recommendation was made for follow-up echo following recovery for bacteremia/infection.  Limited echo in January 2022 showed an EF of 50 to 55% with moderate to severe aortic stenosis.  She was treated with bisoprolol therapy for frequent PVCs.  She was last seen by her cardiologist at [facility] in July, at which time she was doing well.  Follow-up echo at that time showed an EF greater than 55% with moderate to severe paradoxical low-flow low gradient aortic stenosis.  Patient has done reasonably well from a cardiac standpoint but notes that following her bone marrow transplant in November 2021, she has been experiencing chronic fatigue.  She required admission in September 2022 for possible right lower lobe pneumonia and Enterococcus faecalis UTI.  She was readmitted in late December 2022 secondary to low-grade  fever following a dental procedure a few days prior.  She was also found to have a viral respiratory infection.  She initially met sepsis criteria and was initially treated with IV antibiotics which were narrowed to Augmentin.  She presented to [facility] regional on December 18 due to ongoing fatigue, cough, and fever of 103 at home.  In the ED, she was noted to be tachycardic (sinus tachycardia, 115, left bundle branch block), with hypertension, relatively unremarkable labs (chronic thrombocytopenia), and normal troponins.  COVID and flu screens were negative.  Chest x-ray showed central pulmonary vessel prominence suggesting interstitial edema with small bilateral pleural effusions..  CTA of the chest showed no evidence of PE.  Possible bronchitis pattern was noted.  Neither coronary  artery calcifications nor aortic atherosclerosis were noted.  Urinalysis was felt to be abnormal and she was placed on antibiotic therapy.  She was admitted and treated for sepsis.  She has been followed by infectious disease with ongoing low-grade fevers and presumed viral URI.  On the evening of the 19th, medical staff was alerted to dyspnea on exertion with wheezing, rhonchi, and cough.  Troponins were sent off and have since returned elevated at 47  216  819  1156  1569  2239  2111  2143  1176  1194  915  840.  She denies ever experiencing chest pain.  Does report some jaw pain and she says that that has been ongoing since dental work in December.  An echocardiogram was performed on December 21 and was notable for a drop in EF to 35 to 40% with severe aortic stenosis.  She was seen by cardiology with recommendation for conservative management in the setting of acute febrile illness and outpatient cardiology follow-up with her primary cardiologist.  She had recurrent dyspnea late in the evening on the 21st and was treated with intravenous Lasix.  She notes improvement in dyspnea since then and denies chest pain.  She has been noted to have frequent PVCs with runs of ventricular bigeminy, and ongoing sinus tachycardia with intermittent left bundle branch block.  She has voiced a desire to establish care with our practice and in the setting of significant ventricular ectopy, we have been asked to evaluate Ms. Hilliker today.  Inpatient Medications     acyclovir  400 mg Oral BID   azithromycin  250 mg Oral Daily   bisoprolol  5 mg Oral Daily   Chlorhexidine Gluconate Cloth  6 each Topical Daily   dextromethorphan-guaiFENesin  1 tablet Oral BID   enoxaparin (LOVENOX) injection  40 mg Subcutaneous Q24H   feeding supplement  237 mL Oral BID BM   FLUoxetine  40 mg Oral Daily   fluticasone  1 spray Each Nare Daily   heparin lock flush  500 Units Intracatheter Q30 days   insulin aspart  0-15 Units  Subcutaneous TID WC   insulin aspart  3 Units Subcutaneous TID WC   insulin glargine-yfgn  5 Units Subcutaneous BID   ipratropium-albuterol  3 mL Nebulization TID   isosorbide mononitrate  30 mg Oral Daily   magnesium chloride  1 tablet Oral BID   mouth rinse  15 mL Mouth Rinse BID   multivitamin with minerals  1 tablet Oral Daily   pantoprazole  80 mg Oral Daily   traZODone  100 mg Oral QHS    Family History    Family History  Problem Relation Age of Onset   Colon cancer Father    Renal Disease Father  Diabetes Mellitus II Father    Melanoma Paternal Grandmother    Breast cancer Maternal Aunt 15   Anemia Mother    Heart disease Mother    Heart failure Mother    Renal Disease Mother    Congestive Heart Failure Mother    Heart disease Maternal Uncle    Throat cancer Maternal Uncle    Lung cancer Maternal Uncle    Liver disease Maternal Uncle    Heart failure Maternal Uncle    Hearing loss Son 45       Suicide    She indicated that her mother is deceased. She indicated that her father is deceased. She indicated that the status of her paternal grandmother is unknown. She indicated that her son is deceased. She indicated that her maternal aunt is deceased.   Social History    Social History   Socioeconomic History   Marital status: Widowed    Spouse name: Not on file   Number of children: Not on file   Years of education: Not on file   Highest education level: Not on file  Occupational History   Occupation: Disabled  Tobacco Use   Smoking status: Former    Packs/day: 1.00    Years: 20.00    Pack years: 20.00    Types: Cigarettes    Quit date: 07/02/1991    Years since quitting: 30.1   Smokeless tobacco: Never  Vaping Use   Vaping Use: Never used  Substance and Sexual Activity   Alcohol use: Yes    Comment: occasionally   Drug use: No   Sexual activity: Never  Other Topics Concern   Not on file  Social History Narrative   Pt lives with her 2 sisters    Social Determinants of Health   Financial Resource Strain: Not on file  Food Insecurity: Not on file  Transportation Needs: Not on file  Physical Activity: Not on file  Stress: Not on file  Social Connections: Not on file  Intimate Partner Violence: Not on file     Review of Systems    General:  No chills, fever, night sweats or weight changes.  Cardiovascular:  No chest pain, +++ some degree of chronic dyspnea on exertion, no edema, orthopnea, palpitations, paroxysmal nocturnal dyspnea. Dermatological: No rash, lesions/masses Respiratory:+++ cough, +++ dyspnea Urologic: No hematuria, dysuria Abdominal:   No nausea, vomiting, diarrhea, bright red blood per rectum, melena, or hematemesis Neurologic:  No visual changes, wkns, changes in mental status. All other systems reviewed and are otherwise negative except as noted above.  Physical Exam    Blood pressure (!) 106/54, pulse 82, temperature 98.6 F (37 C), resp. rate 19, height 5' 2"  (1.575 m), weight 70.6 kg, SpO2 95 %.  General: Pleasant, NAD Psych: Normal affect. Neuro: Alert and oriented X 3. Moves all extremities spontaneously. HEENT: Normal  Neck: Supple without bruits or JVD. Lungs:  Resp regular and unlabored, diminished breath sounds at bilateral bases. Heart: RRR no s3, s4, 2/6 systolic murmur loudest at the upper sternal borders.   Abdomen: Soft, non-tender, non-distended, BS + x 4.  Extremities: No clubbing, cyanosis or edema. DP/PT2+, Radials 2+ and equal bilaterally.  Labs    Cardiac Enzymes Recent Labs  Lab 08/21/21 0928 08/22/21 0857 08/22/21 1027 08/23/21 0220 08/23/21 0410  TROPONINIHS 2,143* 1,176* 1,194* 915* 840*      Lab Results  Component Value Date   WBC 7.2 08/24/2021   HGB 9.0 (L) 08/24/2021   HCT 27.4 (L)  08/24/2021   MCV 81.1 08/24/2021   PLT 105 (L) 08/24/2021    Recent Labs  Lab 08/24/21 0530  NA 133*  K 3.4*  CL 100  CO2 22  BUN 24*  CREATININE 1.14*  CALCIUM 8.3*   PROT 6.2*  BILITOT 0.5  ALKPHOS 78  ALT 33  AST 34  GLUCOSE 112*   Lab Results  Component Value Date   CHOL 99 08/24/2021   HDL 40 (L) 08/24/2021   LDLCALC 44 08/24/2021   TRIG 77 08/24/2021     Radiology Studies    DG Chest 1 View  Result Date: 08/21/2021 CLINICAL DATA:  Dyspnea. EXAM: CHEST  1 VIEW COMPARISON:  Radiograph 08/20/2021, yesterday.  CT 08/18/2021 FINDINGS: Right chest port in place. Stable heart size and mediastinal contours. Vascular congestion and perivascular haziness suspicious for pulmonary edema, not significantly changed from prior. Suspected small bilateral pleural effusions are similar. Small perifissural right upper lobe opacity. No pneumothorax. IMPRESSION: Findings suspicious for pulmonary edema and small pleural effusions. Perifissural opacity in the right upper lobe may be related to fluid in the fissure or adjacent airspace disease. Electronically Signed   By: Keith Rake M.D.   On: 08/21/2021 22:49   DG Chest 1 View  Result Date: 08/19/2021 CLINICAL DATA:  Dyspnea. EXAM: CHEST  1 VIEW COMPARISON:  August 18, 2021 FINDINGS: There is stable right-sided venous Port-A-Cath positioning. The heart size and mediastinal contours are within normal limits. Both lungs are clear. Multilevel degenerative changes are seen throughout the thoracic spine. IMPRESSION: No active disease. Electronically Signed   By: Virgina Norfolk M.D.   On: 08/19/2021 21:19   DG Chest 2 View  Result Date: 08/18/2021 CLINICAL DATA:  Shortness of breath, cough, fever, suspected sepsis, multiple myeloma EXAM: CHEST - 2 VIEW COMPARISON:  08/17/2021 FINDINGS: The heart size and mediastinal contours are within normal limits. Both lungs are clear. Disc degenerative disease of the thoracic spine. IMPRESSION: No acute abnormality of the lungs. Electronically Signed   By: Delanna Ahmadi M.D.   On: 08/18/2021 18:13   DG Chest 2 View  Result Date: 08/17/2021 CLINICAL DATA:  Cough and  low-grade fever. Multiple myeloma. Port in place. EXAM: CHEST - 2 VIEW COMPARISON:  06/23/2021 FINDINGS: Right chest wall porta catheter tip overlies the superior vena cava/right atrial junction. Cardiac silhouette and mediastinal contours are within normal limits. The lungs are clear. No pleural effusion or pneumothorax. Mild-to-moderate multilevel degenerative disc changes of the thoracic spine. Cholecystectomy clips. IMPRESSION: No active cardiopulmonary disease. Electronically Signed   By: Yvonne Kendall M.D.   On: 08/17/2021 13:56   CT Angio Chest PE W and/or Wo Contrast  Result Date: 08/18/2021 CLINICAL DATA:  Pulmonary embolism suspected, high probability. Shortness of breath and fever beginning 3 days ago. EXAM: CT ANGIOGRAPHY CHEST WITH CONTRAST TECHNIQUE: Multidetector CT imaging of the chest was performed using the standard protocol during bolus administration of intravenous contrast. Multiplanar CT image reconstructions and MIPs were obtained to evaluate the vascular anatomy. RADIATION DOSE REDUCTION: This exam was performed according to the departmental dose-optimization program which includes automated exposure control, adjustment of the mA and/or kV according to patient size and/or use of iterative reconstruction technique. CONTRAST:  56m OMNIPAQUE IOHEXOL 350 MG/ML SOLN COMPARISON:  Chest radiography same day. Prior CT angiography 03/17/2020 FINDINGS: Cardiovascular: The heart is enlarged. No pericardial fluid. No visible coronary artery calcification or aortic atherosclerotic calcification. Pulmonary arterial opacification is moderate. No pulmonary emboli are seen. Mediastinum/Nodes: Chronic calcified mediastinal  lymph nodes as noted previously. Lungs/Pleura: No pulmonary infiltrate, collapse or effusion. Patient does have some scattered areas of bronchial thickening. No pleural effusion. Upper Abdomen: Fatty change of the liver. No focal lesion is visible. Previous cholecystectomy.  Musculoskeletal: Negative Review of the MIP images confirms the above findings. IMPRESSION: No pulmonary emboli. Mild cardiomegaly as seen previously. Possible bronchitis pattern. No pulmonary consolidation or collapse. No pleural effusion. Chronic calcified mediastinal lymph nodes. Electronically Signed   By: Nelson Chimes M.D.   On: 08/18/2021 20:08   DG Chest Port 1 View  Result Date: 08/20/2021 CLINICAL DATA:  Shortness of breath, cough EXAM: PORTABLE CHEST 1 VIEW COMPARISON:  Previous studies including the examination of 08/19/2021 FINDINGS: Transverse diameter of heart is within normal limits. Tip of right IJ chest port is seen in the superior vena cava close to the right atrium. Central pulmonary vessels are more prominent. There is interval increase in interstitial markings in both parahilar regions and both lower lung fields. There is no focal consolidation. There is minimal blunting of lateral CP angles. There is no pneumothorax. IMPRESSION: Central pulmonary vessels are more prominent suggesting CHF. Increased interstitial markings are seen in both lungs suggesting interstitial pulmonary edema. Less likely possibility would be interstitial pneumonia. Minimal bilateral pleural effusions. Electronically Signed   By: Elmer Picker M.D.   On: 08/20/2021 17:59    ECG & Cardiac Imaging    February 18 at 17:26 -sinus tachycardia, 115, left bundle branch block-personally reviewed February 20 at 18:08 - sinus tachycardia, 136, left axis deviation, left bundle branch block-personally reviewed February 23 at  00:33 - sinus rhythm, 97, ventricular bigeminy -personally reviewed August 24, 2021 at 21:56 - regular sinus rhythm, 97, left axis deviation, left bundle branch block- personally reviewed.  Telemetry: Sinus rhythm to sinus tachycardia with intermittent left bundle branch block, frequent PVCs, and ventricular bigeminy -personally reviewed  Assessment & Plan    1.  Viral URI/sepsis:  Patient admitted February 18, with cough, fever, and sepsis.  Initial chest x-ray suggested no active disease.  CT of the chest was negative for PE.  She has been managed for viral illness and followed by infectious disease as well as oncology in the setting of history of multiple myeloma.  She has continued to have intermittent, now low-grade fevers with a temperature of 99 earlier this morning.  Management per she is currently on azithromycin and ceftriaxone therapy.  Per IM/ID.  2.  Non-STEMI/new intermittent left bundle branch block: In the setting of above, patient initially had a normal troponin in the emergency department however, on the evening of the 19th into the early morning hours of the 20th, she was experiencing dyspnea and troponins were reevaluated and noted to be elevated at 47  216  819  1156  1569  2239  2111  2143  1176  1194  915  840.  She was placed on heparin and seen by cardiology.  Echo showed an EF of 35 to 40%, which was down from greater than 55% in July 2022.  Aortic stenosis also appears to have progressed to being severe (previously felt to be moderate to severe low-flow low gradient).  Patient has not had any chest pain.  Her EKG throughout admission has been abnormal with intermittent left bundle branch block both on 12 leads and on telemetry, and frequent PVCs in the setting of prior history of frequent PVCs (home dose of bisoprolol has been held throughout this admission).  Prior diagnostic catheterization in  2017 showed normal coronary arteries.  No significant coronary calcium noted on CTA at the time of admission.  Suspect LV dysfunction is nonischemic in origin though potentially related to worsening aortic valve stenosis.  As such, she will require further cardiac evaluation to include diagnostic catheterization/TAVR w/u, following recovery from infectious issues.  We expect this to be performed on an outpatient basis, and she expresses interest in being followed by our  group locally.  Patient understands that TAVR work-up will take place in [place] (noted that she was tired of driving to [place] because of the distance from her home in [place]).  She has been receiving metoprolol, which appears to have been ineffective for management of her PVCs and I have discontinued this and resume her home dose of bisoprolol, which was previously noted to be highly effective.  No aspirin in the setting of chronic thrombocytopenia.  She has an LDL cholesterol of 44 and is statin nave.  3.  Severe aortic stenosis: Patient with a history of functionally bicuspid aortic valve and low-flow low gradient aortic stenosis.  She was previously evaluated by structural heart team at [facility] in November 2021 in the setting of LV dysfunction and bacteremia/sepsis after bone marrow biopsy.  Echo during that admission showed severe AS with recommendation for follow-up limited echo in the outpatient setting after recovery, and this was performed in January 2022, showing moderate to severe aortic stenosis.  Most recent echo in the outpatient setting in July 2022 showed the same-moderate to severe low flow/low gradient AS.  As above, echo this admission with EF of 35 to 40% and severe AS.  We will plan on early outpatient follow-up and referral to our structural heart team in [facility] for further evaluation.  Resume home dose of bisoprolol.  4.  Cardiomyopathy/acute HFrEF: EF 35 to 40% by echo this admission.  Etiology of worsening LV function unclear at this point-question ischemic versus nonischemic cardiomyopathy versus resulting from progression of valvular heart disease.  Chest x-ray on the 21st showed pulmonary edema, and she was treated with intravenous Lasix.  She has diminished breath sounds in the bases but otherwise appears to be euvolemic on examination.  She is -5 L for admission.  Resuming home dose of bisoprolol.  Blood pressures have been soft and thus I will hold off on adding ARB at  this time (also history of CKD 3 though renal function stable).  Consider SGLT2 inhibitor if not otherwise contraindicated.  5.  PVCs:  H/o PVCs for which she uses bisoprolol as an outpatient.  This has been held here and she has had frequent PVCs with bigeminy.  Asymptomatic.  She was placed on metoprolol the other day though this has not been particularly effective in limiting PVC burden.  We will discontinue metoprolol and resume bisoprolol.  Follow-up potassium as this was low at 3.4 yesterday.  Follow-up magnesium.  6.  Essential hypertension: Blood pressure lower this morning but variable throughout admission.  Resuming home dose of bisoprolol.  7.  Multiple myeloma: Status post bone marrow transplant in November 2021.  Followed by oncology in the outpatient setting.  8.  Thrombocytopenia: Stable.  Avoiding aspirin.  9.  Normocytic anemia: Stable.  10.  Hypokalemia: Follow-up potassium and magnesium today and supplement as necessary.  11.  CKD III:  stable.  Signed, Murray Hodgkins, NP 08/25/2021, 12:36 PM  For questions or updates, please contact   Please consult [email or web address] for contact info under Cardiology/STEMI.

## 2021-08-27 ENCOUNTER — Telehealth: Payer: Self-pay

## 2021-08-27 ENCOUNTER — Encounter: Payer: Self-pay | Admitting: Oncology

## 2021-08-27 ENCOUNTER — Encounter: Payer: Self-pay | Admitting: Internal Medicine

## 2021-08-27 LAB — CMV DNA, QUANTITATIVE, PCR
CMV DNA Quant: NEGATIVE IU/mL
Log10 CMV Qn DNA Pl: UNDETERMINED log10 IU/mL

## 2021-08-27 NOTE — Telephone Encounter (Signed)
08/27/2021 Spoke w/ pt and informed her of f/u appts following discharge from the hospital. Pt scheduled for 3/2 for Lab/NP/IV mag and 3/9 for Lab/MD/Dara+IV Mag. SRW

## 2021-08-27 NOTE — Telephone Encounter (Signed)
Patient has been discharged form hospital. Please schedule patient for the following and inform her of appts:   Lab/NP/ mag this week (cbc,cmp, mag)  Lab/MD/ Dara/ mag one day next week.

## 2021-08-27 NOTE — Telephone Encounter (Signed)
Transition Care Management Follow-up Telephone Call Date of discharge and from where: 08/25/21 How have you been since you were released from the hospital? Pt states she feels weak Any questions or concerns? No  Items Reviewed: Did the pt receive and understand the discharge instructions provided? Yes  Medications obtained and verified? Yes  Other? No  Any new allergies since your discharge? No  Dietary orders reviewed? Yes Do you have support at home? Yes   Home Care and Equipment/Supplies: Were home health services ordered? yes If so, what is the name of the agency? [facility]  Has the agency set up a time to come to the patient's home? yes Were any new equipment or medical supplies ordered?  No   Functional Questionnaire: (I = Independent and D = Dependent) ADLs: I  Bathing/Dressing- I with assistance  Meal Prep- I  Eating- I  Maintaining continence- I  Transferring/Ambulation- I  Managing Meds- I  Follow up appointments reviewed:  PCP Hospital f/u appt confirmed? No; pt waiting to confirm schedule with her sister. [facility] f/u appt confirmed? No  pt schedule with oncology and cardiology. Are transportation arrangements needed? No  If their condition worsens, is the pt aware to call PCP or go to the Emergency Dept.? Yes Was the patient provided with contact information for the PCP's office or ED? Yes Was to pt encouraged to call back with questions or concerns? Yes

## 2021-08-28 LAB — FUNGITELL, SERUM: Fungitell Result: 46 pg/mL (ref <80)

## 2021-08-28 LAB — EPSTEIN BARR VRS(EBV DNA BY PCR): EBV DNA QN by PCR: POSITIVE IU/mL

## 2021-08-29 ENCOUNTER — Ambulatory Visit: Payer: Medicare HMO | Admitting: Oncology

## 2021-08-29 ENCOUNTER — Inpatient Hospital Stay: Payer: Medicare HMO

## 2021-08-29 ENCOUNTER — Other Ambulatory Visit: Payer: Medicare HMO

## 2021-08-29 ENCOUNTER — Ambulatory Visit: Payer: Medicare HMO

## 2021-08-29 LAB — MISC LABCORP TEST (SEND OUT): Labcorp test code: 164284

## 2021-08-29 NOTE — Progress Notes (Signed)
Hematology/Oncology Progress note [phone] [phone]    Clinic Day:  08/29/2021   Referring physician: Glean Hess, MD  Chief Complaint: Sarah [name] is a 66 y.o. 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   Initial bone marrow revealed 80% plasma cells.  Lambda free light chains were 1340.  She had nephrotic range proteinuria.  She initially underwent induction with RVD.  Revlimid maintenance was discontinued on 01/21/2017 secondary to intolerance.     07/19/2019 Bone marrow aspirate and biopsy on  revealed a normocellular marrow with but increased lambda-restricted plasma cells (9% aspirate, 40% CD138 immunohistochemistry).  Findings were consistent with recurrent plasma cell myeloma.  Flow cytometry revealed no monoclonal B-cell or phenotypically aberrant T-cell population. Cytogenetics were 110, XX (normal).  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    Lambda light chains have been followed: 22.2 (ratio 0.56) on 07/03/2017, 30.8 (ratio 0.78) on 09/02/2017, 36.9 (ratio 0.40) on 10/21/2017, 37.4 (ratio 0.41) on 12/16/2017, 70.7(ratio 0.31)  on 02/17/2018, 64.2 (ratio 0.27) on 04/07/2018, 78.9 (ratio 0.18) on 05/26/2018, 128.8 (ratio 0.17) on 08/06/2018, 181.5 (ratio 0.13) on 10/08/2018, 130.9 (ratio 0.13) on 10/20/2018, 160.7 (ratio 0.10) on  12/09/2018, 236.6 (ratio 0.07) on 02/01/2019, 363.6 (ratio 0.04) on 03/22/2019, 404.8 (ratio 0.04) on 04/05/2019, 420.7 (ratio 0.03) on 05/24/2019, 573.4 (ratio 0.03) on 06/23/2019, 451.05 (ratio 0.02) on 08/20/2019, 47.2 (ratio 0.13) on 10/25/2019, 22.4 (ratio 0.21) on 11/25/2019, 16.5 (ratio 0.33) on 01/10/2020, 14.6 (ratio 0.34) on 02/07/2020, 13.1 (ratio 0.31) on 03/07/2020, 10.1 (ratio 0.38) on 04/10/2020, and 9.5 (ratio 0.21) on 06/19/2020.   24 hour UPEP on 06/03/2019 revealed kappa free light chains 95.76, lambda free light chains 1,260.71, and ratio 0.08.  24 hour UPEP on 08/23/2019 revealed total protein of 782 mg/24 hrs with lambda free light chains 1,084.16 mg/L and ratio of 0.10 (1.03-31.76).  M spike in urine was 46.1% (361 mg/24 hrs).    Bone survey on 04/08/2016 and 05/28/2017 revealed no definite lytic lesion seen in the visualized skeleton.  Bone survey on 11/19/2018 revealed no suspicious lucent lesions and no acute bony abnormality.  PET scan on 07/12/2019 revealed no focal metabolic activity to suggest active myeloma within the skeleton. There were no lytic lesions identified on the CT portion of the exam or soft tissue plasmacytomas. There was no evidence of multiple myeloma.    Pretreatment RBC phenotype on 09/23/2019 was positive for C, e, DUFFY B, KIDD B, M, S, and s antigen; negative for c, E, KELL, DUFFY A, KIDD A, and N antigen.    09/27/2019 - 10/25/2019; 12/09/2019 - 03/13/2020 6 cycles of daratumumab and hyaluronidase-fihj, Pomalyst, and Decadron (DPd) .  Cycle #1 was complicated by fever and neutropenia requiring admission.  Cycle #2 was complicated by pneumonia requiring admission.  Cycle #6 was complicated with an ER evaluation for an elevated lactic acid.   05/10/2020 second autologous stem cell transplant at [facility]   She underwent  conditioning with melphalan 140 mg/m2.    She is s/p week #3 Velcade maintenance (began 08/31/2020 - 09/28/2020).  She has no increase in  neuropathy.  She has severe aortic stenosis.  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 Cardiology is following for a possible TAVR in the future.  Echo on 07/05/2020 revealed moderate to severe aortic stenosis with an EF of 50-55%.  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 Zometa was discontinued in 01/2017.  She has chronic nausea on Phenergan.     B12 deficiency.  B12 was 254 on 04/09/2017, 295 on 08/20/2018, and 391 on 10/08/2018.  She was on oral B12.  She received B12 monthly (last 09/14/2020).  Folate was 12.1 on 01/10/2020.    She has iron deficiency.  Ferritin was 32 on 07/01/2019.  She received Venofer on 07/15/2019 and 07/22/2019.   She has hypogammaglobulinemia.  IgG was 245 on 11/25/2019.  She received monthly IVIG (07/22/021 - 03/22/2020).  She received IVIG 400 mg/kg on 01/20/2020, 200 mg/kg on 02/17/2020, and 300 mg/kg on 03/22/2020.  IVIG on 16/04/9603 was complicated by acute renal failure. IgG trough level was 418 on 02/17/2020.  10/15/2019 - 10/20/2019 She was admitted to [facility] with fever and neutropenia.  Cultures were negative.  CXR was negative. She received broad spectrum antibiotics and daily Granix.  She received IVF for acute renal insufficiency due to diarrhea and dehydration.  Creatine was 1.66 on admission and 1.12 on discharge.    03/01/2020 - 03/05/2020 admitted to [facility] from with fever and neutropenia. CXR revealed no active cardiopulmonary disease. Chest CT with contrast revealed no acute intrathoracic pathology. There were findings which could be suggestive of prior granulomatous disease. She was treated with Cefepime and Vancomycin, then switched to ciprofloxacin on 03/03/2020.   05/08/2020 - 12/05/2021The patient was admitted to [facility] from  for autologous bone marrow transplant.   She received conditioning with melphalan 140 mg/m2.  Bone marrow transplant was on  05/10/2020. The patient developed fevers daily from 05/19/2020 - 05/24/2020. Blood cultures were + for strept sanguis bacteremia.  She was treated cefepime, vancomycin, and solumedrol for possible engraftment syndrome. Cefepime was switched to ceftriaxone; she completed 2 weeks of ceftriaxone on 06/03/2020. She had chemotherapy induced diarrhea.  She experienced urinary retention.  Feb 2022 patient has been on maintenance Velcade 2 mg every 2 weeks.  Chronic diarrhea seen by gastroenterology Dr. Vicente Males and was recommended to avoid artificial sugars in her diet including Diet Coke and coffee mate.  She feels some improvement of her diarrhea frequency since the dietary modification.  GI profile PCR negative, C. difficile toxin A+ B negative, fecal calprotectin 77 12/19/2020 colonoscopy showed 2 subcentimeter polyps in the ascending colon, resected and removed.  Pathology showed tubular adenoma.  No malignancy.  Normal mucosa in the entire examined colon.  Biopsied, pathology showed colonic mucosa with no significant pathological alteration.  Negative for active inflammation and features of chronicity.  Negative for microscopic colitis, dysplasia, and malignanc  Diabetes, patient has discontinued metformin due to diarrhea and started on glipizide 2.5 mg  03/05/2021-03/09/2021 Patient was hospitalized due to fever and chills, nonproductive cough, shortness of breath. X-ray showed right lower lobe atelectasis versus pneumonia.  Blood cultures negative.  Urine culture showed 60,000 colonies of Enterococcus facialis.  Patient received antibiotics. Patient also had abdominal pelvis CT scan for work-up of intermittent lower abdomen pain.No acute abnormality.  05/14/21 last dose of Velcade maintenance.  establish care with neurology Dr. Gurney Maxin and her neuropathy regimen has been switched from gabapentin to Lyrica. 05/29/2021 patient got influenza   INTERVAL HISTORY: Sarah [name] is a 66 y.o. female  with multiple myeloma s/p autologous stem cell transplant, currently on daratumumab maintenance, who presents to clinic for hospital follow up.   Patient presented from home to ER on 08/18/2021 with fever of 103, cough, sore throat, hoarseness.  She was found to be tachycardic, tachypneic.  Respiratory virus panel positive for metapneumovirus.  She was admitted.  Overnight troponin peaked at 2239.  EKG with new left bundle branch block, echo with new onset reduced EF of 30-35% and regional wall motion abnormalities consistent with NSTEMI.  She received heparin infusion.  Chest x-ray revealed some bilateral infiltrates and she received IV Lasix for possible pulmonary edema mild worsening renal function.  Started her on community-acquired pneumonia coverage for concern of superadded bacterial infection.  She was discharged home on 08/25/2021 on Augmentin and Zithromax.  Also received acyclovir.  This propofol was discontinued due to AKI and low blood pressure.  Recommended she follow-up with cardiology outpatient.  She received a dose of IVIG during hospitalization.  Repeat immunoglobulin levels are pending at time of discharge and have since pended. Improved but persistently low. Fungal antibodies were negative.  She's not seen cardiology. Reports ongoing shortness of breath which is improving slowly. She continues antivirals. No recurrent fever. She request refill of bispropolol which she takes for palpitations. Also complains of vaginal yeast infection symptoms and requests diflucan.    Past Medical History:  Diagnosis Date   Anemia    Anxiety    Aortic stenosis    a. 05/2020 Echo: EF 45-50%, sev AS - seen by TAVR team @ [facility] - CTA sugg of tricuspid valve w/ fusing of 2 leaflets-TAVR deferred in setting of acute infxn; b. 07/2020 Echo: EF 50-55%, mod-sev AS; c. 12/2020 Echo: EF>55%. Mod-sev paradoxical low-flow low-gradient AS; d. 08/2021 Echo: EF 35-40%, severe AS, triv AI, mild MR.   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 Variable EF over time; b. 08/2017 Echo: EF 40%; b. 03/2020 Echo: EF 55-60%; c. 05/2020 Echo: EF 45-50%; d. 07/2020 Echo: EF 50-55%; e. 12/2020 Echo: EF>55%; f. 08/2021 Echo: EF 35-40%.   Chronic heart failure with preserved ejection fraction (HFpEF) ([place])    a. Variable EF over time; b. 08/2017 Echo: EF 40%; b. 03/2020 Echo: EF 55-60%; c. 05/2020 Echo: EF 45-50%; d. 07/2020 Echo: EF 50-55%; e. 12/2020 Echo: EF>55%; f. 08/2021 Echo: EF 35-40%, no RV, mildly dil LA, mild MR, triv AI, severe AS.   CKD (chronic kidney disease) stage 3, GFR 30-59 ml/min (HCC)    Depression    Diabetes mellitus (HCC)    Dizziness    Fatty liver    Frequent falls    GERD (gastroesophageal reflux disease)    Gout    Heart murmur    History of blood transfusion    History of bone marrow transplant ([place])    History of uterine fibroid    Hx of cardiac catheterization    a. 01/2016 [facility] - after abnl nuc): Nl cors.   Hypertension    Hypomagnesemia    IDA (iron deficiency anemia)    Multiple myeloma (HCC)    Personal history of  chemotherapy    PSVT (paroxysmal supraventricular tachycardia) ([place])    a. S/p [place] [facility]).   PVC's (premature ventricular contractions)    a. Well-managed w/ bisoprolol in outpt setting.   Renal cyst     Past Surgical History:  Procedure Laterality Date   ABDOMINAL HYSTERECTOMY     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   CARDIAC ELECTROPHYSIOLOGY MAPPING AND ABLATION     CARPAL TUNNEL RELEASE Bilateral    CHOLECYSTECTOMY  2008   COLONOSCOPY WITH PROPOFOL N/A 05/07/2017   Procedure: COLONOSCOPY WITH PROPOFOL;  Surgeon: [name], MD;  Location: [facility] ENDOSCOPY;  Service: Gastroenterology;  Laterality: N/A;   COLONOSCOPY WITH PROPOFOL N/A 12/19/2020   Procedure: COLONOSCOPY WITH PROPOFOL;  Surgeon: [name], MD;  Location: [facility]  ENDOSCOPY;  Service: Gastroenterology;  Laterality: N/A;   ESOPHAGOGASTRODUODENOSCOPY (EGD) WITH PROPOFOL N/A 05/07/2017   Procedure: ESOPHAGOGASTRODUODENOSCOPY (EGD) WITH PROPOFOL;  Surgeon: [name], MD;  Location: [facility] ENDOSCOPY;  Service: Gastroenterology;  Laterality: N/A;   FOOT SURGERY Bilateral    INCONTINENCE SURGERY  2009   INTERSTIM IMPLANT PLACEMENT     other     over active bladder   OTHER SURGICAL HISTORY     bladder stimulator    PARTIAL HYSTERECTOMY  03/1996   fibroids   PORTA CATH INSERTION N/A 03/10/2019   Procedure: PORTA CATH INSERTION;  Surgeon: Algernon Huxley, MD;  Location: [place] CV LAB;  Service: Cardiovascular;  Laterality: N/A;   TONSILLECTOMY  2007    Family History  Problem Relation Age of Onset   Colon cancer Father    Renal Disease Father    Diabetes Mellitus II Father    Melanoma Paternal Grandmother    Breast cancer Maternal Aunt 11   Anemia Mother    Heart disease Mother    Heart failure Mother    Renal Disease Mother    Congestive Heart Failure Mother    Heart disease Maternal Uncle    Throat cancer Maternal Uncle    Lung cancer Maternal Uncle    Liver disease Maternal Uncle    Heart failure Maternal Uncle    Hearing loss Son 54       Suicide     Social History:  reports that she quit smoking about 30 years ago. Her smoking use included cigarettes. She has a 20.00 pack-year smoking history. She has never used smokeless tobacco. She reports current alcohol use. She reports that she does not use drugs.  She is on disability. She notes exposure to perchloroethylene [facility]).   Allergies:  Allergies  Allergen Reactions   Oxycodone-Acetaminophen Anaphylaxis    Swelling and rash   Celebrex [Celecoxib] Diarrhea   Codeine    Plerixafor     In 2016 during ASCT collection patient developed fever to 103.66F and required hospitalization   Benadryl [Diphenhydramine] Palpitations   Morphine Itching and Rash   Ondansetron Diarrhea   Tylenol  [Acetaminophen] Itching and Rash    Current Medications: Current Outpatient Medications  Medication Sig Dispense Refill   acyclovir (ZOVIRAX) 400 MG tablet Take 1 tablet (400 mg total) by mouth 2 (two) times daily. 180 tablet 1   albuterol (VENTOLIN HFA) 108 (90 Base) MCG/ACT inhaler Inhale 2 puffs into the lungs every 6 (six) hours as needed for wheezing or shortness of breath. 8 g 0   allopurinol (ZYLOPRIM) 100 MG tablet TAKE 1 TABLET(100 MG) BY MOUTH DAILY as needed 90 tablet 1   azithromycin (ZITHROMAX) 250 MG tablet Take daily for  next 2 days 2 each 0   bisoprolol (ZEBETA) 5 MG tablet Take 5 mg by mouth daily.     diclofenac sodium (VOLTAREN) 1 % GEL Apply 2 g topically 4 (four) times daily. 100 g 1   feeding supplement (ENSURE ENLIVE / ENSURE PLUS) LIQD Take 237 mLs by mouth 2 (two) times daily between meals. 237 mL 12   fluconazole (DIFLUCAN) 150 MG tablet Take 1 tablet (150 mg total) by mouth once as needed for up to 1 dose (vaginal yeast infection symptoms). 1 tablet 0   FLUoxetine (PROZAC) 40 MG capsule TAKE 1 CAPSULE EVERY DAY 90 capsule 0   fluticasone (FLONASE) 50 MCG/ACT nasal spray Place 2 sprays into both nostrils daily. 16 g 2   glucose blood test strip      guaiFENesin-dextromethorphan (ROBITUSSIN DM) 100-10 MG/5ML syrup Take 5 mLs by mouth every 4 (four) hours as needed for cough. 118 mL 0   isosorbide mononitrate (IMDUR) 30 MG 24 hr tablet Take 1 tablet (30 mg total) by mouth daily. 30 tablet 0   magnesium chloride (SLOW-MAG) 64 MG TBEC SR tablet Take 1 tablet (64 mg total) by mouth 2 (two) times daily. 60 tablet 1   montelukast (SINGULAIR) 10 MG tablet Take 1 tablet (10 mg total) by mouth See admin instructions. Take 1 tab daily for 2 days after daratumumab treatments 30 tablet 1   Multiple Vitamin (MULTIVITAMIN) tablet Take 1 tablet by mouth daily.     nitroGLYCERIN (NITROSTAT) 0.4 MG SL tablet Place 1 tablet (0.4 mg total) under the tongue every 5 (five) minutes as needed  for chest pain. 20 tablet 12   omeprazole (PRILOSEC) 40 MG capsule TAKE 1 CAPSULE (40 MG TOTAL) BY MOUTH IN THE MORNING AND AT BEDTIME. 180 capsule 1   promethazine (PHENERGAN) 25 MG tablet Take 1 tablet (25 mg total) by mouth every 6 (six) hours as needed for nausea or vomiting. 90 tablet 1   tiZANidine (ZANAFLEX) 2 MG tablet Take 1 tablet (2 mg total) by mouth every 6 (six) hours as needed for muscle spasms. 30 tablet 1   traMADol (ULTRAM) 50 MG tablet Take 1 tablet (50 mg total) by mouth every 12 (twelve) hours as needed for moderate pain or severe pain. 30 tablet 0   traZODone (DESYREL) 100 MG tablet TAKE 1 TABLET AT BEDTIME 90 tablet 0   No current facility-administered medications for this visit.   Facility-Administered Medications Ordered in Other Visits  Medication Dose Route Frequency Provider Last Rate Last Admin   sodium chloride flush (NS) 0.9 % injection 10 mL  10 mL Intravenous PRN Earlie Server, MD   10 mL at 04/02/21 4166    Review of Systems  Constitutional:  Positive for malaise/fatigue. Negative for chills, fever and weight loss.  HENT:  Negative for hearing loss, nosebleeds, sore throat and tinnitus.   Eyes:  Negative for blurred vision and double vision.  Respiratory:  Positive for cough and shortness of breath. Negative for hemoptysis and wheezing.   Cardiovascular:  Positive for palpitations. Negative for chest pain and leg swelling.  Gastrointestinal:  Negative for abdominal pain, blood in stool, constipation, diarrhea, melena, nausea and vomiting.  Genitourinary:  Negative for dysuria and urgency.  Musculoskeletal:  Negative for back pain, falls, joint pain and myalgias.  Skin:  Negative for itching and rash.  Neurological:  Positive for weakness. Negative for dizziness, tingling, sensory change, loss of consciousness and headaches.  Endo/Heme/Allergies:  Negative for environmental allergies. Does not  bruise/bleed easily.  Psychiatric/Behavioral:  Negative for  depression. The patient is not nervous/anxious and does not have insomnia.     Performance status (ECOG): 1  Vitals Blood pressure 122/80, pulse (!) 104, temperature 98.6 F (37 C), temperature source Tympanic, resp. rate 20, weight 148 lb 6.4 oz (67.3 kg), SpO2 100 %.  Physical Exam Constitutional:      Appearance: She is not ill-appearing.  Cardiovascular:     Rate and Rhythm: Regular rhythm. Tachycardia present.  Pulmonary:     Effort: Pulmonary effort is normal. No respiratory distress.  Abdominal:     General: There is no distension.     Tenderness: There is no abdominal tenderness.  Skin:    General: Skin is warm and dry.  Neurological:     Mental Status: She is alert and oriented to person, place, and time.  Psychiatric:        Mood and Affect: Mood normal.        Behavior: Behavior normal.   Laboratory data CBC Latest Ref Rng & Units 08/30/2021 08/24/2021 08/23/2021  WBC 4.0 - 10.5 K/uL 6.9 7.2 7.7  Hemoglobin 12.0 - 15.0 g/dL 11.1(L) 9.0(L) 10.1(L)  Hematocrit 36.0 - 46.0 % 33.0(L) 27.4(L) 30.0(L)  Platelets 150 - 400 K/uL 171 105(L) 109(L)   CMP Latest Ref Rng & Units 08/30/2021 08/24/2021 08/23/2021  Glucose 70 - 99 mg/dL 155(H) 112(H) 173(H)  BUN 8 - 23 mg/dL 25(H) 24(H) 25(H)  Creatinine 0.44 - 1.00 mg/dL 1.02(H) 1.14(H) 1.12(H)  Sodium 135 - 145 mmol/L 133(L) 133(L) 132(L)  Potassium 3.5 - 5.1 mmol/L 3.7 3.4(L) 4.0  Chloride 98 - 111 mmol/L 102 100 101  CO2 22 - 32 mmol/L 22 22 23   Calcium 8.9 - 10.3 mg/dL 8.9 8.3(L) 8.0(L)  Total Protein 6.5 - 8.1 g/dL 6.9 6.2(L) -  Total Bilirubin 0.3 - 1.2 mg/dL 0.2(L) 0.5 -  Alkaline Phos 38 - 126 U/L 102 78 -  AST 15 - 41 U/L 68(H) 34 -  ALT 0 - 44 U/L 78(H) 33 -   Lab Results  Component Value Date   MG 1.3 (L) 08/30/2021   MG 2.0 08/23/2021   MG 1.1 (L) 08/22/2021     Assessment/Plan 1. Multiple myeloma not having achieved remission (HCC)     Recurrent lambda light chain multiple myeloma s/p second autologous  bone marrow transplant [05/10/2020]. Currently receiving daratumumab injections every 4 weeks with dexamethasone. Hold due to recent hospitalization. Discussed with Dr. Tasia Catchings.  Hyperglobulinemia- post stem cell transplant. Received IVIG in hospital. Will check IgG today. Goal > 500. If low, consider additional dose of IVIG.  Chronic hypomagnesemia- Mag 1.3 today. Proceed with IV magnesium. She will continue slow mag as tolerated. Some diarrhea but this may also be related to antibiotics.  Diarrhea- mild. Monitor.  Palpitations- refill bisoprolol today. Will decrease to 2.5 mg daily given recent hypotension. She will monitor pressures at home and resume normal dosing of 5 mg daily if tolerating.  Neuropathy- chemo induced. Grade 2. Monitor.  NSTEMI and acute HfrEF- awaiting follow up with cardiology to evaluate degree of ischemia Sepsis- symptomatically improving; fever has resolved. Monitor.  Vulvovaginal candidiasis- script for diflucan 150 mg on day 1 and 4 sent to pharmacy.  Abnormal LFTs- AST and ALT slightly elevated. Monitor.   Disposition:  1 week- lab, NP, infusion for magnesium- la   I discussed the assessment and treatment plan with the patient.  The patient was provided an opportunity to ask questions and all  were answered.  The patient agreed with the plan and demonstrated an understanding of the instructions.  The patient was advised to call back if the symptoms worsen or if the condition fails to improve as anticipated.   Beckey Rutter, DNP, AGNP-C [place] at [facility] [phone] (clinic) 08/29/2021

## 2021-08-30 ENCOUNTER — Inpatient Hospital Stay: Payer: Medicare HMO | Attending: Nurse Practitioner

## 2021-08-30 ENCOUNTER — Inpatient Hospital Stay: Payer: Medicare HMO

## 2021-08-30 ENCOUNTER — Encounter: Payer: Self-pay | Admitting: Oncology

## 2021-08-30 ENCOUNTER — Other Ambulatory Visit: Payer: Self-pay

## 2021-08-30 ENCOUNTER — Inpatient Hospital Stay: Payer: Medicare HMO | Attending: Nurse Practitioner | Admitting: Nurse Practitioner

## 2021-08-30 ENCOUNTER — Encounter: Payer: Self-pay | Admitting: Internal Medicine

## 2021-08-30 ENCOUNTER — Encounter: Payer: Self-pay | Admitting: Nurse Practitioner

## 2021-08-30 VITALS — BP 122/80 | HR 104 | Temp 98.6°F | Resp 20 | Wt 148.4 lb

## 2021-08-30 DIAGNOSIS — R110 Nausea: Secondary | ICD-10-CM | POA: Insufficient documentation

## 2021-08-30 DIAGNOSIS — Z79899 Other long term (current) drug therapy: Secondary | ICD-10-CM | POA: Insufficient documentation

## 2021-08-30 DIAGNOSIS — I214 Non-ST elevation (NSTEMI) myocardial infarction: Secondary | ICD-10-CM | POA: Insufficient documentation

## 2021-08-30 DIAGNOSIS — I429 Cardiomyopathy, unspecified: Secondary | ICD-10-CM | POA: Insufficient documentation

## 2021-08-30 DIAGNOSIS — R002 Palpitations: Secondary | ICD-10-CM | POA: Insufficient documentation

## 2021-08-30 DIAGNOSIS — R7989 Other specified abnormal findings of blood chemistry: Secondary | ICD-10-CM | POA: Insufficient documentation

## 2021-08-30 DIAGNOSIS — E1122 Type 2 diabetes mellitus with diabetic chronic kidney disease: Secondary | ICD-10-CM | POA: Insufficient documentation

## 2021-08-30 DIAGNOSIS — Z8249 Family history of ischemic heart disease and other diseases of the circulatory system: Secondary | ICD-10-CM | POA: Insufficient documentation

## 2021-08-30 DIAGNOSIS — R197 Diarrhea, unspecified: Secondary | ICD-10-CM | POA: Insufficient documentation

## 2021-08-30 DIAGNOSIS — I447 Left bundle-branch block, unspecified: Secondary | ICD-10-CM | POA: Diagnosis not present

## 2021-08-30 DIAGNOSIS — C9001 Multiple myeloma in remission: Secondary | ICD-10-CM | POA: Insufficient documentation

## 2021-08-30 DIAGNOSIS — C9000 Multiple myeloma not having achieved remission: Secondary | ICD-10-CM | POA: Insufficient documentation

## 2021-08-30 DIAGNOSIS — Z9049 Acquired absence of other specified parts of digestive tract: Secondary | ICD-10-CM | POA: Insufficient documentation

## 2021-08-30 DIAGNOSIS — R000 Tachycardia, unspecified: Secondary | ICD-10-CM | POA: Insufficient documentation

## 2021-08-30 DIAGNOSIS — Z800 Family history of malignant neoplasm of digestive organs: Secondary | ICD-10-CM | POA: Insufficient documentation

## 2021-08-30 DIAGNOSIS — Z9481 Bone marrow transplant status: Secondary | ICD-10-CM | POA: Insufficient documentation

## 2021-08-30 DIAGNOSIS — E538 Deficiency of other specified B group vitamins: Secondary | ICD-10-CM | POA: Insufficient documentation

## 2021-08-30 DIAGNOSIS — Z8379 Family history of other diseases of the digestive system: Secondary | ICD-10-CM | POA: Insufficient documentation

## 2021-08-30 DIAGNOSIS — Z841 Family history of disorders of kidney and ureter: Secondary | ICD-10-CM | POA: Insufficient documentation

## 2021-08-30 DIAGNOSIS — Z832 Family history of diseases of the blood and blood-forming organs and certain disorders involving the immune mechanism: Secondary | ICD-10-CM | POA: Insufficient documentation

## 2021-08-30 DIAGNOSIS — Z833 Family history of diabetes mellitus: Secondary | ICD-10-CM | POA: Insufficient documentation

## 2021-08-30 DIAGNOSIS — Z822 Family history of deafness and hearing loss: Secondary | ICD-10-CM | POA: Insufficient documentation

## 2021-08-30 DIAGNOSIS — Z808 Family history of malignant neoplasm of other organs or systems: Secondary | ICD-10-CM | POA: Insufficient documentation

## 2021-08-30 DIAGNOSIS — Z87891 Personal history of nicotine dependence: Secondary | ICD-10-CM | POA: Diagnosis not present

## 2021-08-30 DIAGNOSIS — Z885 Allergy status to narcotic agent status: Secondary | ICD-10-CM | POA: Diagnosis not present

## 2021-08-30 DIAGNOSIS — Z9221 Personal history of antineoplastic chemotherapy: Secondary | ICD-10-CM | POA: Diagnosis not present

## 2021-08-30 DIAGNOSIS — I350 Nonrheumatic aortic (valve) stenosis: Secondary | ICD-10-CM | POA: Insufficient documentation

## 2021-08-30 DIAGNOSIS — R059 Cough, unspecified: Secondary | ICD-10-CM | POA: Diagnosis not present

## 2021-08-30 DIAGNOSIS — Z9484 Stem cells transplant status: Secondary | ICD-10-CM | POA: Insufficient documentation

## 2021-08-30 DIAGNOSIS — D801 Nonfamilial hypogammaglobulinemia: Secondary | ICD-10-CM | POA: Diagnosis not present

## 2021-08-30 DIAGNOSIS — R339 Retention of urine, unspecified: Secondary | ICD-10-CM | POA: Insufficient documentation

## 2021-08-30 DIAGNOSIS — N1831 Chronic kidney disease, stage 3a: Secondary | ICD-10-CM | POA: Insufficient documentation

## 2021-08-30 DIAGNOSIS — Z801 Family history of malignant neoplasm of trachea, bronchus and lung: Secondary | ICD-10-CM | POA: Insufficient documentation

## 2021-08-30 DIAGNOSIS — Z803 Family history of malignant neoplasm of breast: Secondary | ICD-10-CM | POA: Insufficient documentation

## 2021-08-30 DIAGNOSIS — I5043 Acute on chronic combined systolic (congestive) and diastolic (congestive) heart failure: Secondary | ICD-10-CM | POA: Diagnosis not present

## 2021-08-30 DIAGNOSIS — Z888 Allergy status to other drugs, medicaments and biological substances status: Secondary | ICD-10-CM | POA: Insufficient documentation

## 2021-08-30 DIAGNOSIS — I130 Hypertensive heart and chronic kidney disease with heart failure and stage 1 through stage 4 chronic kidney disease, or unspecified chronic kidney disease: Secondary | ICD-10-CM | POA: Insufficient documentation

## 2021-08-30 DIAGNOSIS — Z886 Allergy status to analgesic agent status: Secondary | ICD-10-CM | POA: Insufficient documentation

## 2021-08-30 DIAGNOSIS — F32A Depression, unspecified: Secondary | ICD-10-CM | POA: Diagnosis not present

## 2021-08-30 LAB — CBC WITH DIFFERENTIAL/PLATELET
Abs Immature Granulocytes: 0.08 10*3/uL — ABNORMAL HIGH (ref 0.00–0.07)
Basophils Absolute: 0 10*3/uL (ref 0.0–0.1)
Basophils Relative: 1 %
Eosinophils Absolute: 0.1 10*3/uL (ref 0.0–0.5)
Eosinophils Relative: 1 %
HCT: 33 % — ABNORMAL LOW (ref 36.0–46.0)
Hemoglobin: 11.1 g/dL — ABNORMAL LOW (ref 12.0–15.0)
Immature Granulocytes: 1 %
Lymphocytes Relative: 21 %
Lymphs Abs: 1.5 10*3/uL (ref 0.7–4.0)
MCH: 27.3 pg (ref 26.0–34.0)
MCHC: 33.6 g/dL (ref 30.0–36.0)
MCV: 81.1 fL (ref 80.0–100.0)
Monocytes Absolute: 0.6 10*3/uL (ref 0.1–1.0)
Monocytes Relative: 9 %
Neutro Abs: 4.6 10*3/uL (ref 1.7–7.7)
Neutrophils Relative %: 67 %
Platelets: 171 10*3/uL (ref 150–400)
RBC: 4.07 MIL/uL (ref 3.87–5.11)
RDW: 14.5 % (ref 11.5–15.5)
WBC: 6.9 10*3/uL (ref 4.0–10.5)
nRBC: 0 % (ref 0.0–0.2)

## 2021-08-30 LAB — COMPREHENSIVE METABOLIC PANEL
ALT: 78 U/L — ABNORMAL HIGH (ref 0–44)
AST: 68 U/L — ABNORMAL HIGH (ref 15–41)
Albumin: 3.5 g/dL (ref 3.5–5.0)
Alkaline Phosphatase: 102 U/L (ref 38–126)
Anion gap: 9 (ref 5–15)
BUN: 25 mg/dL — ABNORMAL HIGH (ref 8–23)
CO2: 22 mmol/L (ref 22–32)
Calcium: 8.9 mg/dL (ref 8.9–10.3)
Chloride: 102 mmol/L (ref 98–111)
Creatinine, Ser: 1.02 mg/dL — ABNORMAL HIGH (ref 0.44–1.00)
GFR, Estimated: >60 mL/min (ref >60)
Glucose, Bld: 155 mg/dL — ABNORMAL HIGH (ref 70–99)
Potassium: 3.7 mmol/L (ref 3.5–5.1)
Sodium: 133 mmol/L — ABNORMAL LOW (ref 135–145)
Total Bilirubin: 0.2 mg/dL — ABNORMAL LOW (ref 0.3–1.2)
Total Protein: 6.9 g/dL (ref 6.5–8.1)

## 2021-08-30 LAB — MAGNESIUM: Magnesium: 1.3 mg/dL — ABNORMAL LOW (ref 1.7–2.4)

## 2021-08-30 MED ORDER — SODIUM CHLORIDE 0.9 % IV SOLN
Freq: Once | INTRAVENOUS | Status: AC
  Filled 2021-08-30: qty 250

## 2021-08-30 MED ORDER — FLUCONAZOLE 150 MG PO TABS: ORAL_TABLET | ORAL | 0 refills | Status: DC

## 2021-08-30 MED ORDER — HEPARIN SOD (PORK) LOCK FLUSH 100 UNIT/ML IV SOLN
500.0000 [IU] | Freq: Once | INTRAVENOUS | Status: AC
  Administered 2021-08-30: 500 [IU] via INTRAVENOUS
  Filled 2021-08-30: qty 5

## 2021-08-30 MED ORDER — SODIUM CHLORIDE 0.9 % IV SOLN: 6.0000 g | Freq: Once | INTRAVENOUS | Status: DC

## 2021-08-30 MED ORDER — MAGNESIUM SULFATE 4 GM/100ML IV SOLN
4.0000 g | Freq: Once | INTRAVENOUS | Status: AC
  Administered 2021-08-30: 4 g via INTRAVENOUS
  Filled 2021-08-30: qty 100

## 2021-08-30 MED ORDER — HEPARIN SOD (PORK) LOCK FLUSH 100 UNIT/ML IV SOLN
500.0000 [IU] | Freq: Once | INTRAVENOUS | Status: DC | PRN
  Filled 2021-08-30: qty 5

## 2021-08-30 MED ORDER — SODIUM CHLORIDE 0.9 % IV SOLN
25.0000 mg | Freq: Once | INTRAVENOUS | Status: AC
  Administered 2021-08-30: 25 mg via INTRAVENOUS
  Filled 2021-08-30: qty 1

## 2021-08-30 MED ORDER — BISOPROLOL FUMARATE 5 MG PO TABS: 2.5000 mg | ORAL_TABLET | Freq: Every day | ORAL | 0 refills | Status: DC

## 2021-08-30 MED ORDER — MAGNESIUM SULFATE 2 GM/50ML IV SOLN
2.0000 g | Freq: Once | INTRAVENOUS | Status: AC
  Administered 2021-08-30: 2 g via INTRAVENOUS
  Filled 2021-08-30: qty 50

## 2021-08-30 NOTE — Progress Notes (Signed)
Pt in for follow up, reports recently discharged from hospital.  Pt states she is having loose stools, up to 3 days and states it is from magnesium med.  Pt reports she needs multiple meds refilled instructed to discuss with provider today.  Pt is short of breath, reports has been having it since being hospitalized.   ?

## 2021-08-31 ENCOUNTER — Inpatient Hospital Stay: Payer: Medicare HMO | Admitting: Internal Medicine

## 2021-08-31 LAB — IGG, IGA, IGM
IgA: 31 mg/dL — ABNORMAL LOW (ref 87–352)
IgG (Immunoglobin G), Serum: 598 mg/dL (ref 586–1602)
IgM (Immunoglobulin M), Srm: 16 mg/dL — ABNORMAL LOW (ref 26–217)

## 2021-09-05 ENCOUNTER — Inpatient Hospital Stay: Payer: Medicare HMO

## 2021-09-06 ENCOUNTER — Other Ambulatory Visit: Payer: Self-pay

## 2021-09-06 ENCOUNTER — Inpatient Hospital Stay: Payer: Medicare HMO

## 2021-09-06 ENCOUNTER — Inpatient Hospital Stay (HOSPITAL_BASED_OUTPATIENT_CLINIC_OR_DEPARTMENT_OTHER): Payer: Medicare HMO | Admitting: Oncology

## 2021-09-06 ENCOUNTER — Other Ambulatory Visit (INDEPENDENT_AMBULATORY_CARE_PROVIDER_SITE_OTHER): Payer: Medicare HMO | Admitting: Internal Medicine

## 2021-09-06 ENCOUNTER — Inpatient Hospital Stay: Payer: Medicare HMO | Attending: Oncology

## 2021-09-06 ENCOUNTER — Encounter: Payer: Self-pay | Admitting: Oncology

## 2021-09-06 VITALS — BP 100/62 | HR 80 | Temp 96.4°F | Wt 148.0 lb

## 2021-09-06 DIAGNOSIS — Z5112 Encounter for antineoplastic immunotherapy: Secondary | ICD-10-CM | POA: Diagnosis not present

## 2021-09-06 DIAGNOSIS — Z888 Allergy status to other drugs, medicaments and biological substances status: Secondary | ICD-10-CM | POA: Diagnosis not present

## 2021-09-06 DIAGNOSIS — Z885 Allergy status to narcotic agent status: Secondary | ICD-10-CM | POA: Insufficient documentation

## 2021-09-06 DIAGNOSIS — I5032 Chronic diastolic (congestive) heart failure: Secondary | ICD-10-CM | POA: Diagnosis not present

## 2021-09-06 DIAGNOSIS — F32A Depression, unspecified: Secondary | ICD-10-CM | POA: Insufficient documentation

## 2021-09-06 DIAGNOSIS — K219 Gastro-esophageal reflux disease without esophagitis: Secondary | ICD-10-CM | POA: Insufficient documentation

## 2021-09-06 DIAGNOSIS — Z833 Family history of diabetes mellitus: Secondary | ICD-10-CM | POA: Insufficient documentation

## 2021-09-06 DIAGNOSIS — I447 Left bundle-branch block, unspecified: Secondary | ICD-10-CM

## 2021-09-06 DIAGNOSIS — C9000 Multiple myeloma not having achieved remission: Secondary | ICD-10-CM

## 2021-09-06 DIAGNOSIS — I130 Hypertensive heart and chronic kidney disease with heart failure and stage 1 through stage 4 chronic kidney disease, or unspecified chronic kidney disease: Secondary | ICD-10-CM

## 2021-09-06 DIAGNOSIS — I350 Nonrheumatic aortic (valve) stenosis: Secondary | ICD-10-CM | POA: Diagnosis not present

## 2021-09-06 DIAGNOSIS — Z822 Family history of deafness and hearing loss: Secondary | ICD-10-CM | POA: Insufficient documentation

## 2021-09-06 DIAGNOSIS — I214 Non-ST elevation (NSTEMI) myocardial infarction: Secondary | ICD-10-CM

## 2021-09-06 DIAGNOSIS — Z8249 Family history of ischemic heart disease and other diseases of the circulatory system: Secondary | ICD-10-CM | POA: Insufficient documentation

## 2021-09-06 DIAGNOSIS — D649 Anemia, unspecified: Secondary | ICD-10-CM | POA: Insufficient documentation

## 2021-09-06 DIAGNOSIS — C9001 Multiple myeloma in remission: Secondary | ICD-10-CM | POA: Insufficient documentation

## 2021-09-06 DIAGNOSIS — Z79899 Other long term (current) drug therapy: Secondary | ICD-10-CM | POA: Diagnosis not present

## 2021-09-06 DIAGNOSIS — Z841 Family history of disorders of kidney and ureter: Secondary | ICD-10-CM | POA: Insufficient documentation

## 2021-09-06 DIAGNOSIS — D631 Anemia in chronic kidney disease: Secondary | ICD-10-CM | POA: Insufficient documentation

## 2021-09-06 DIAGNOSIS — Z8379 Family history of other diseases of the digestive system: Secondary | ICD-10-CM | POA: Insufficient documentation

## 2021-09-06 DIAGNOSIS — A4189 Other specified sepsis: Secondary | ICD-10-CM

## 2021-09-06 DIAGNOSIS — Z5111 Encounter for antineoplastic chemotherapy: Secondary | ICD-10-CM

## 2021-09-06 DIAGNOSIS — E538 Deficiency of other specified B group vitamins: Secondary | ICD-10-CM | POA: Diagnosis not present

## 2021-09-06 DIAGNOSIS — Z886 Allergy status to analgesic agent status: Secondary | ICD-10-CM | POA: Insufficient documentation

## 2021-09-06 DIAGNOSIS — N1831 Chronic kidney disease, stage 3a: Secondary | ICD-10-CM | POA: Insufficient documentation

## 2021-09-06 DIAGNOSIS — Z9049 Acquired absence of other specified parts of digestive tract: Secondary | ICD-10-CM | POA: Insufficient documentation

## 2021-09-06 DIAGNOSIS — Z9484 Stem cells transplant status: Secondary | ICD-10-CM

## 2021-09-06 DIAGNOSIS — D801 Nonfamilial hypogammaglobulinemia: Secondary | ICD-10-CM | POA: Diagnosis not present

## 2021-09-06 DIAGNOSIS — N3000 Acute cystitis without hematuria: Secondary | ICD-10-CM

## 2021-09-06 DIAGNOSIS — Z87891 Personal history of nicotine dependence: Secondary | ICD-10-CM | POA: Diagnosis not present

## 2021-09-06 DIAGNOSIS — I429 Cardiomyopathy, unspecified: Secondary | ICD-10-CM | POA: Diagnosis not present

## 2021-09-06 DIAGNOSIS — T451X5A Adverse effect of antineoplastic and immunosuppressive drugs, initial encounter: Secondary | ICD-10-CM | POA: Insufficient documentation

## 2021-09-06 DIAGNOSIS — Z801 Family history of malignant neoplasm of trachea, bronchus and lung: Secondary | ICD-10-CM | POA: Insufficient documentation

## 2021-09-06 DIAGNOSIS — Z818 Family history of other mental and behavioral disorders: Secondary | ICD-10-CM | POA: Insufficient documentation

## 2021-09-06 DIAGNOSIS — J40 Bronchitis, not specified as acute or chronic: Secondary | ICD-10-CM

## 2021-09-06 DIAGNOSIS — F324 Major depressive disorder, single episode, in partial remission: Secondary | ICD-10-CM

## 2021-09-06 DIAGNOSIS — E1122 Type 2 diabetes mellitus with diabetic chronic kidney disease: Secondary | ICD-10-CM | POA: Diagnosis not present

## 2021-09-06 DIAGNOSIS — G620 Drug-induced polyneuropathy: Secondary | ICD-10-CM

## 2021-09-06 DIAGNOSIS — N179 Acute kidney failure, unspecified: Secondary | ICD-10-CM

## 2021-09-06 DIAGNOSIS — M48062 Spinal stenosis, lumbar region with neurogenic claudication: Secondary | ICD-10-CM

## 2021-09-06 DIAGNOSIS — Z808 Family history of malignant neoplasm of other organs or systems: Secondary | ICD-10-CM | POA: Insufficient documentation

## 2021-09-06 DIAGNOSIS — Z800 Family history of malignant neoplasm of digestive organs: Secondary | ICD-10-CM | POA: Insufficient documentation

## 2021-09-06 DIAGNOSIS — R1310 Dysphagia, unspecified: Secondary | ICD-10-CM | POA: Diagnosis not present

## 2021-09-06 DIAGNOSIS — I5021 Acute systolic (congestive) heart failure: Secondary | ICD-10-CM

## 2021-09-06 DIAGNOSIS — Z832 Family history of diseases of the blood and blood-forming organs and certain disorders involving the immune mechanism: Secondary | ICD-10-CM | POA: Insufficient documentation

## 2021-09-06 DIAGNOSIS — Z803 Family history of malignant neoplasm of breast: Secondary | ICD-10-CM | POA: Insufficient documentation

## 2021-09-06 DIAGNOSIS — E118 Type 2 diabetes mellitus with unspecified complications: Secondary | ICD-10-CM

## 2021-09-06 DIAGNOSIS — M47816 Spondylosis without myelopathy or radiculopathy, lumbar region: Secondary | ICD-10-CM

## 2021-09-06 DIAGNOSIS — I10 Essential (primary) hypertension: Secondary | ICD-10-CM

## 2021-09-06 LAB — CBC WITH DIFFERENTIAL/PLATELET
Abs Immature Granulocytes: 0.02 10*3/uL (ref 0.00–0.07)
Basophils Absolute: 0.1 10*3/uL (ref 0.0–0.1)
Basophils Relative: 1 %
Eosinophils Absolute: 0.1 10*3/uL (ref 0.0–0.5)
Eosinophils Relative: 2 %
HCT: 35.2 % — ABNORMAL LOW (ref 36.0–46.0)
Hemoglobin: 11.7 g/dL — ABNORMAL LOW (ref 12.0–15.0)
Immature Granulocytes: 0 %
Lymphocytes Relative: 20 %
Lymphs Abs: 1.2 10*3/uL (ref 0.7–4.0)
MCH: 27.5 pg (ref 26.0–34.0)
MCHC: 33.2 g/dL (ref 30.0–36.0)
MCV: 82.6 fL (ref 80.0–100.0)
Monocytes Absolute: 0.5 10*3/uL (ref 0.1–1.0)
Monocytes Relative: 9 %
Neutro Abs: 4.2 10*3/uL (ref 1.7–7.7)
Neutrophils Relative %: 68 %
Platelets: 176 10*3/uL (ref 150–400)
RBC: 4.26 MIL/uL (ref 3.87–5.11)
RDW: 15.7 % — ABNORMAL HIGH (ref 11.5–15.5)
WBC: 6.1 10*3/uL (ref 4.0–10.5)
nRBC: 0 % (ref 0.0–0.2)

## 2021-09-06 LAB — COMPREHENSIVE METABOLIC PANEL
ALT: 50 U/L — ABNORMAL HIGH (ref 0–44)
AST: 41 U/L (ref 15–41)
Albumin: 4.1 g/dL (ref 3.5–5.0)
Alkaline Phosphatase: 97 U/L (ref 38–126)
Anion gap: 13 (ref 5–15)
BUN: 32 mg/dL — ABNORMAL HIGH (ref 8–23)
CO2: 19 mmol/L — ABNORMAL LOW (ref 22–32)
Calcium: 9.4 mg/dL (ref 8.9–10.3)
Chloride: 102 mmol/L (ref 98–111)
Creatinine, Ser: 1.04 mg/dL — ABNORMAL HIGH (ref 0.44–1.00)
GFR, Estimated: >60 mL/min (ref >60)
Glucose, Bld: 191 mg/dL — ABNORMAL HIGH (ref 70–99)
Potassium: 3.7 mmol/L (ref 3.5–5.1)
Sodium: 134 mmol/L — ABNORMAL LOW (ref 135–145)
Total Bilirubin: 0.6 mg/dL (ref 0.3–1.2)
Total Protein: 7.1 g/dL (ref 6.5–8.1)

## 2021-09-06 LAB — MAGNESIUM: Magnesium: 1.1 mg/dL — ABNORMAL LOW (ref 1.7–2.4)

## 2021-09-06 LAB — VITAMIN B12: Vitamin B-12: 905 pg/mL (ref 180–914)

## 2021-09-06 MED ORDER — SODIUM CHLORIDE 0.9 % IV SOLN: 6.0000 g | Freq: Once | INTRAVENOUS | Status: DC

## 2021-09-06 MED ORDER — SODIUM CHLORIDE 0.9 % IV SOLN
Freq: Once | INTRAVENOUS | Status: AC
  Filled 2021-09-06: qty 250

## 2021-09-06 MED ORDER — MAGNESIUM SULFATE 4 GM/100ML IV SOLN
4.0000 g | Freq: Once | INTRAVENOUS | Status: AC
  Administered 2021-09-06: 10:00:00 4 g via INTRAVENOUS
  Filled 2021-09-06: qty 100

## 2021-09-06 MED ORDER — DARATUMUMAB-HYALURONIDASE-FIHJ 1800-30000 MG-UT/15ML ~~LOC~~ SOLN
1800.0000 mg | Freq: Once | SUBCUTANEOUS | Status: AC
  Administered 2021-09-06: 11:00:00 1800 mg via SUBCUTANEOUS
  Filled 2021-09-06: qty 15

## 2021-09-06 MED ORDER — MAGNESIUM SULFATE 2 GM/50ML IV SOLN
2.0000 g | Freq: Once | INTRAVENOUS | Status: AC
  Administered 2021-09-06: 12:00:00 2 g via INTRAVENOUS
  Filled 2021-09-06: qty 50

## 2021-09-06 MED ORDER — DIPHENHYDRAMINE HCL 25 MG PO CAPS
25.0000 mg | ORAL_CAPSULE | Freq: Once | ORAL | Status: AC
  Administered 2021-09-06: 10:00:00 25 mg via ORAL
  Filled 2021-09-06: qty 1

## 2021-09-06 MED ORDER — HEPARIN SOD (PORK) LOCK FLUSH 100 UNIT/ML IV SOLN
INTRAVENOUS | Status: AC
  Administered 2021-09-06: 14:00:00 500 [IU]
  Filled 2021-09-06: qty 5

## 2021-09-06 MED ORDER — MONTELUKAST SODIUM 10 MG PO TABS
10.0000 mg | ORAL_TABLET | Freq: Once | ORAL | Status: AC
  Administered 2021-09-06: 10:00:00 10 mg via ORAL
  Filled 2021-09-06: qty 1

## 2021-09-06 MED ORDER — CYANOCOBALAMIN 1000 MCG/ML IJ SOLN
1000.0000 ug | Freq: Once | INTRAMUSCULAR | Status: AC
  Administered 2021-09-06: 10:00:00 1000 ug via INTRAMUSCULAR
  Filled 2021-09-06: qty 1

## 2021-09-06 MED ORDER — SODIUM CHLORIDE 0.9 % IV SOLN
25.0000 mg | Freq: Once | INTRAVENOUS | Status: AC
  Administered 2021-09-06: 10:00:00 25 mg via INTRAVENOUS
  Filled 2021-09-06: qty 1

## 2021-09-06 MED ORDER — DEXAMETHASONE 4 MG PO TABS
20.0000 mg | ORAL_TABLET | Freq: Once | ORAL | Status: AC
  Administered 2021-09-06: 10:00:00 20 mg via ORAL
  Filled 2021-09-06: qty 5

## 2021-09-06 NOTE — Progress Notes (Signed)
Patient here for follow up. Patient denies any concerns. °

## 2021-09-06 NOTE — Progress Notes (Signed)
Received orders from [place]. ?Start of care 08/28/21.   Orders from 08/28/21 through 10/26/21 are reviewed, signed and faxed.  ? ?

## 2021-09-06 NOTE — Progress Notes (Signed)
Hematology/Oncology Progress note [phone] [phone]        Clinic Day:  09/06/2021   Referring physician: Glean Hess, MD  Chief Complaint: Sarah [name] is a 66 y.o. 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   Initial bone marrow revealed 80% plasma cells.  Lambda free light chains were 1340.  She had nephrotic range proteinuria.  She initially underwent induction with RVD.  Revlimid maintenance was discontinued on 01/21/2017 secondary to intolerance.     07/19/2019 Bone marrow aspirate and biopsy on  revealed a normocellular marrow with but increased lambda-restricted plasma cells (9% aspirate, 40% CD138 immunohistochemistry).  Findings were consistent with recurrent plasma cell myeloma.  Flow cytometry revealed no monoclonal B-cell or phenotypically aberrant T-cell population. Cytogenetics were 56, XX (normal).  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    Lambda light chains have been followed: 22.2 (ratio 0.56) on 07/03/2017, 30.8 (ratio 0.78) on 09/02/2017, 36.9 (ratio 0.40) on 10/21/2017, 37.4 (ratio 0.41) on 12/16/2017, 70.7(ratio 0.31)  on 02/17/2018, 64.2 (ratio 0.27) on 04/07/2018, 78.9 (ratio 0.18) on 05/26/2018, 128.8 (ratio 0.17) on 08/06/2018, 181.5 (ratio 0.13) on 10/08/2018, 130.9 (ratio 0.13) on 10/20/2018, 160.7 (ratio  0.10) on 12/09/2018, 236.6 (ratio 0.07) on 02/01/2019, 363.6 (ratio 0.04) on 03/22/2019, 404.8 (ratio 0.04) on 04/05/2019, 420.7 (ratio 0.03) on 05/24/2019, 573.4 (ratio 0.03) on 06/23/2019, 451.05 (ratio 0.02) on 08/20/2019, 47.2 (ratio 0.13) on 10/25/2019, 22.4 (ratio 0.21) on 11/25/2019, 16.5 (ratio 0.33) on 01/10/2020, 14.6 (ratio 0.34) on 02/07/2020, 13.1 (ratio 0.31) on 03/07/2020, 10.1 (ratio 0.38) on 04/10/2020, and 9.5 (ratio 0.21) on 06/19/2020.   24 hour UPEP on 06/03/2019 revealed kappa free light chains 95.76, lambda free light chains 1,260.71, and ratio 0.08.  24 hour UPEP on 08/23/2019 revealed total protein of 782 mg/24 hrs with lambda free light chains 1,084.16 mg/L and ratio of 0.10 (1.03-31.76).  M spike in urine was 46.1% (361 mg/24 hrs).    Bone survey on 04/08/2016 and 05/28/2017 revealed no definite lytic lesion seen in the visualized skeleton.  Bone survey on 11/19/2018 revealed no suspicious lucent lesions and no acute bony abnormality.  PET scan on 07/12/2019 revealed no focal metabolic activity to suggest active myeloma within the skeleton. There were no lytic lesions identified on the CT portion of the exam or soft tissue plasmacytomas. There was no evidence of multiple myeloma.    Pretreatment RBC phenotype on 09/23/2019 was positive for C, e, DUFFY B, KIDD B, M, S, and s antigen; negative for c, E, KELL, DUFFY A, KIDD A, and N antigen.    09/27/2019 - 10/25/2019; 12/09/2019 - 03/13/2020 6 cycles of daratumumab and hyaluronidase-fihj, Pomalyst, and Decadron (DPd) .  Cycle #1 was complicated by fever and neutropenia requiring admission.  Cycle #2 was complicated by pneumonia requiring admission.  Cycle #6 was complicated with an ER evaluation for an elevated lactic acid.   05/10/2020 second autologous stem cell transplant at  [facility]   She underwent conditioning with melphalan 140 mg/m2.    She is s/p week #3 Velcade maintenance (began 08/31/2020 - 09/28/2020).  She has no increase  in neuropathy.  She has severe aortic stenosis.  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 Cardiology is following for a possible TAVR in the future.  Echo on 07/05/2020 revealed moderate to severe aortic stenosis with an EF of 50-55%.  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 Zometa was discontinued in 01/2017.  She has chronic nausea on Phenergan.     B12 deficiency.  B12 was 254 on 04/09/2017, 295 on 08/20/2018, and 391 on 10/08/2018.  She was on oral B12.  She received B12 monthly (last 09/14/2020).  Folate was 12.1 on 01/10/2020.    She has iron deficiency.  Ferritin was 32 on 07/01/2019.  She received Venofer on 07/15/2019 and 07/22/2019.   She has hypogammaglobulinemia.  IgG was 245 on 11/25/2019.  She received monthly IVIG (07/22/021 - 03/22/2020).  She received IVIG 400 mg/kg on 01/20/2020, 200 mg/kg on 02/17/2020, and 300 mg/kg on 03/22/2020.  IVIG on 03/50/0938 was complicated by acute renal failure. IgG trough level was 418 on 02/17/2020.  10/15/2019 - 10/20/2019 She was admitted to [facility] with fever and neutropenia.  Cultures were negative.  CXR was negative. She received broad spectrum antibiotics and daily Granix.  She received IVF for acute renal insufficiency due to diarrhea and dehydration.  Creatine was 1.66 on admission and 1.12 on discharge.    03/01/2020 - 03/05/2020 admitted to [facility] from with fever and neutropenia. CXR revealed no active cardiopulmonary disease. Chest CT with contrast revealed no acute intrathoracic pathology. There were findings which could be suggestive of prior granulomatous disease. She was treated with Cefepime and Vancomycin, then switched to ciprofloxacin on 03/03/2020.   05/08/2020 - 12/05/2021The patient was admitted to [facility] from  for autologous bone marrow transplant.   She received conditioning with melphalan 140 mg/m2.  Bone marrow transplant was on  05/10/2020. The patient developed fevers daily from 05/19/2020 - 05/24/2020. Blood cultures were + for strept sanguis bacteremia.  She was treated cefepime, vancomycin, and solumedrol for possible engraftment syndrome. Cefepime was switched to ceftriaxone; she completed 2 weeks of ceftriaxone on 06/03/2020. She had chemotherapy induced diarrhea.  She experienced urinary retention.  Feb 2022 patient has been on maintenance Velcade 2 mg every 2 weeks.  Chronic diarrhea seen by gastroenterology Dr. Vicente Males and was recommended to avoid artificial sugars in her diet including Diet Coke and coffee mate.  She feels some improvement of her diarrhea frequency since the dietary modification.  GI profile PCR negative, C. difficile toxin A+ B negative, fecal calprotectin 77 12/19/2020 colonoscopy showed 2 subcentimeter polyps in the ascending colon, resected and removed.  Pathology showed tubular adenoma.  No malignancy.  Normal mucosa in the entire examined colon.  Biopsied, pathology showed colonic mucosa with no significant pathological alteration.  Negative for active inflammation and features of chronicity.  Negative for microscopic colitis, dysplasia, and malignanc  Diabetes, patient has discontinued metformin due to diarrhea and started on glipizide 2.5 mg   03/05/2021-03/09/2021 Patient was hospitalized due to fever and chills, nonproductive cough, shortness of breath. X-ray showed right lower lobe atelectasis versus pneumonia.  Blood cultures negative.  Urine culture showed 60,000 colonies of Enterococcus facialis.  Patient received antibiotics. Patient also had abdominal pelvis CT scan for work-up of intermittent  lower abdomen pain.No acute abnormality.   05/14/21 last dose of Velcade maintenance.  establish care with neurology Dr. Gurney Maxin and her neuropathy regimen has been switched from gabapentin to Lyrica. 05/29/2021 patient got influenza   neuropathy medication has been switched from  gabapentin/Lyrica to nortriptyline.  INTERVAL HISTORY Lynae Pederson is a 66 y.o. female who has above history reviewed by me today presents for follow up visit for management of multiple myeloma.   08/18/2021 Hospitalized due to pneumonia from metapnuemovirus, hospitalization was complicated with NSTEMI. She received heparin gtt.  Treated with antibiotics for coverage of pneumonia,  diuretics for pulmonary edema. She received IVIG 482m/kg x 1 for hypoglobulinemia. Discharged on 08/25/21 with Augmentin and Zithromax.  Patient presents for follow up . Cough has improved a lot, no fever chills. Nausea or vomiting. She is on antivirals. She take slow mag every other day. Continues to have intermittent diarrhea.   Past Medical History:  Diagnosis Date   Anemia    Anxiety    Aortic stenosis    a. 05/2020 Echo: EF 45-50%, sev AS - [facility] CTA sugg of tricuspid valve w/ fusing of 2 leaflets-TAVR deferred in setting of acute infxn; b. 07/2020 Echo: EF 50-55%, mod-sev AS; c. 12/2020 Echo: EF>55%. Mod-sev paradoxical low-flow low-gradient AS; d. 08/2021 Echo: EF 35-40%, severe AS, triv AI, mild MR.   Arthritis    Bicuspid aortic valve    Bisphosphonate-associated osteonecrosis of the jaw ([place] 02/25/2017   Due to Zometa   Cardiomyopathy, idiopathic ([place]    a. Variable EF over time; b. 08/2017 Echo: EF 40%; b. 03/2020 Echo: EF 55-60%; c. 05/2020 Echo: EF 45-50%; d. 07/2020 Echo: EF 50-55%; e. 12/2020 Echo: EF>55%; f. 08/2021 Echo: EF 35-40%.   Chronic heart failure with preserved ejection fraction (HFpEF) ([place]    a. Variable EF over time; b. 08/2017 Echo: EF 40%; b. 03/2020 Echo: EF 55-60%; c. 05/2020 Echo: EF 45-50%; d. 07/2020 Echo: EF 50-55%; e. 12/2020 Echo: EF>55%; f. 08/2021 Echo: EF 35-40%, no RV, mildly dil LA, mild MR, triv AI, severe AS.   CKD (chronic kidney disease) stage 3, GFR 30-59 ml/min (HCC)    Depression    Diabetes mellitus (HCC)    Dizziness    Fatty liver    Frequent  falls    GERD (gastroesophageal reflux disease)    Gout    Heart murmur    History of blood transfusion    [place] of [place] transplant ([place]    History of uterine fibroid    Hx of cardiac catheterization    a. 01/2016 Cath [facility] after abnl nuc): Nl cors.   Hypertension    Hypomagnesemia    IDA (iron deficiency anemia)    Multiple myeloma (HCC)    Personal history of chemotherapy    PSVT (paroxysmal supraventricular tachycardia) ([place]    a. [place].   PVC's (premature ventricular contractions)    a. Well-managed w/ bisoprolol in outpt setting.   Renal cyst     Past Surgical History:  Procedure Laterality Date   ABDOMINAL HYSTERECTOMY     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   CARDIAC ELECTROPHYSIOLOGY MAPPING AND ABLATION     CARPAL TUNNEL RELEASE Bilateral    CHOLECYSTECTOMY  2008   COLONOSCOPY WITH PROPOFOL N/A 05/07/2017   Procedure: COLONOSCOPY WITH PROPOFOL;  Surgeon: AJonathon Bellows MD;  [facility]  Service: Gastroenterology;  Laterality: N/A;   COLONOSCOPY WITH PROPOFOL  N/A 12/19/2020   Procedure: COLONOSCOPY WITH PROPOFOL;  Surgeon: [name], MD;  Location: [facility] ENDOSCOPY;  Service: Gastroenterology;  Laterality: N/A;   ESOPHAGOGASTRODUODENOSCOPY (EGD) WITH PROPOFOL N/A 05/07/2017   Procedure: ESOPHAGOGASTRODUODENOSCOPY (EGD) WITH PROPOFOL;  Surgeon: [name], MD;  Location: [facility] ENDOSCOPY;  Service: Gastroenterology;  Laterality: N/A;   FOOT SURGERY Bilateral    INCONTINENCE SURGERY  2009   INTERSTIM IMPLANT PLACEMENT     other     over active bladder   OTHER SURGICAL HISTORY     bladder stimulator    PARTIAL HYSTERECTOMY  03/1996   fibroids   PORTA CATH INSERTION N/A 03/10/2019   Procedure: PORTA CATH INSERTION;  Surgeon: Algernon Huxley, MD;  Location: [place] CV LAB;  Service: Cardiovascular;  Laterality: N/A;   TONSILLECTOMY  2007    Family History  Problem Relation Age of Onset   Colon cancer Father    Renal  Disease Father    Diabetes Mellitus II Father    Melanoma Paternal Grandmother    Breast cancer Maternal Aunt 49   Anemia Mother    Heart disease Mother    Heart failure Mother    Renal Disease Mother    Congestive Heart Failure Mother    Heart disease Maternal Uncle    Throat cancer Maternal Uncle    Lung cancer Maternal Uncle    Liver disease Maternal Uncle    Heart failure Maternal Uncle    Hearing loss Son 76       Suicide     Social History:  reports that she quit smoking about 30 years ago. Her smoking use included cigarettes. She has a 20.00 pack-year smoking history. She has never used smokeless tobacco. She reports current alcohol use. She reports that she does not use drugs.  She is on disability. She notes exposure to perchloroethylene [facility]).   Allergies:  Allergies  Allergen Reactions   Oxycodone-Acetaminophen Anaphylaxis    Swelling and rash   Celebrex [Celecoxib] Diarrhea   Codeine    Plerixafor     In 2016 during ASCT collection patient developed fever to 103.27F and required hospitalization   Benadryl [Diphenhydramine] Palpitations   Morphine Itching and Rash   Ondansetron Diarrhea   Tylenol [Acetaminophen] Itching and Rash    Current Medications: Current Outpatient Medications  Medication Sig Dispense Refill   acyclovir (ZOVIRAX) 400 MG tablet Take 1 tablet (400 mg total) by mouth 2 (two) times daily. 180 tablet 1   albuterol (VENTOLIN HFA) 108 (90 Base) MCG/ACT inhaler Inhale 2 puffs into the lungs every 6 (six) hours as needed for wheezing or shortness of breath. 8 g 0   allopurinol (ZYLOPRIM) 100 MG tablet TAKE 1 TABLET(100 MG) BY MOUTH DAILY as needed 90 tablet 1   azithromycin (ZITHROMAX) 250 MG tablet Take daily for next 2 days 2 each 0   bisoprolol (ZEBETA) 5 MG tablet Take 0.5 tablets (2.5 mg total) by mouth daily. 30 tablet 0   feeding supplement (ENSURE ENLIVE / ENSURE PLUS) LIQD Take 237 mLs by mouth 2 (two) times daily between meals. 237 mL 12    fluconazole (DIFLUCAN) 150 MG tablet Take 1 tablet (150 mg) by mouth on day one then repeat dose 3 days later as needed for vaginal yeast infection. 2 tablet 0   FLUoxetine (PROZAC) 40 MG capsule TAKE 1 CAPSULE EVERY DAY 90 capsule 0   glucose blood test strip      guaiFENesin-dextromethorphan (ROBITUSSIN DM) 100-10 MG/5ML syrup Take 5 mLs  by mouth every 4 (four) hours as needed for cough. 118 mL 0   isosorbide mononitrate (IMDUR) 30 MG 24 hr tablet Take 1 tablet (30 mg total) by mouth daily. 30 tablet 0   magnesium chloride (SLOW-MAG) 64 MG TBEC SR tablet Take 1 tablet (64 mg total) by mouth 2 (two) times daily. 60 tablet 1   montelukast (SINGULAIR) 10 MG tablet Take 1 tablet (10 mg total) by mouth See admin instructions. Take 1 tab daily for 2 days after daratumumab treatments 30 tablet 1   Multiple Vitamin (MULTIVITAMIN) tablet Take 1 tablet by mouth daily.     nitroGLYCERIN (NITROSTAT) 0.4 MG SL tablet Place 1 tablet (0.4 mg total) under the tongue every 5 (five) minutes as needed for chest pain. 20 tablet 12   omeprazole (PRILOSEC) 40 MG capsule TAKE 1 CAPSULE (40 MG TOTAL) BY MOUTH IN THE MORNING AND AT BEDTIME. 180 capsule 1   promethazine (PHENERGAN) 25 MG tablet Take 1 tablet (25 mg total) by mouth every 6 (six) hours as needed for nausea or vomiting. 90 tablet 1   tiZANidine (ZANAFLEX) 2 MG tablet Take 1 tablet (2 mg total) by mouth every 6 (six) hours as needed for muscle spasms. 30 tablet 1   traMADol (ULTRAM) 50 MG tablet Take 1 tablet (50 mg total) by mouth every 12 (twelve) hours as needed for moderate pain or severe pain. 30 tablet 0   traZODone (DESYREL) 100 MG tablet TAKE 1 TABLET AT BEDTIME 90 tablet 0   diclofenac sodium (VOLTAREN) 1 % GEL Apply 2 g topically 4 (four) times daily. (Patient not taking: Reported on 09/06/2021) 100 g 1   fluticasone (FLONASE) 50 MCG/ACT nasal spray Place 2 sprays into both nostrils daily. (Patient not taking: Reported on 09/06/2021) 16 g 2   No  current facility-administered medications for this visit.   Facility-Administered Medications Ordered in Other Visits  Medication Dose Route Frequency Provider Last Rate Last Admin   magnesium sulfate IVPB 2 g 50 mL  2 g Intravenous Once Earlie Server, MD       magnesium sulfate IVPB 4 g 100 mL  4 g Intravenous Once Earlie Server, MD 50 mL/hr at 09/06/21 0957 4 g at 09/06/21 0957   sodium chloride flush (NS) 0.9 % injection 10 mL  10 mL Intravenous PRN Earlie Server, MD   10 mL at 04/02/21 5284    Review of Systems  Constitutional:  Negative for chills, fever, malaise/fatigue and weight loss.  HENT:  Negative for sore throat.   Eyes:  Negative for redness.  Respiratory:  Negative for cough, shortness of breath and wheezing.   Cardiovascular:  Negative for chest pain, palpitations and leg swelling.  Gastrointestinal:  Positive for diarrhea and nausea. Negative for abdominal pain, blood in stool and vomiting.  Genitourinary:  Negative for dysuria.  Musculoskeletal:  Positive for back pain and joint pain. Negative for myalgias.  Skin:  Negative for rash.  Neurological:  Positive for tingling and sensory change. Negative for dizziness and tremors.  Endo/Heme/Allergies:  Does not bruise/bleed easily.  Psychiatric/Behavioral:  Negative for hallucinations.    Performance status (ECOG): 1  Vitals Blood pressure 100/62, pulse 80, temperature (!) 96.4 F (35.8 C), temperature source Tympanic, weight 148 lb (67.1 kg), SpO2 97 %.  Physical Exam Vitals and nursing note reviewed.  Constitutional:      General: She is not in acute distress.    Appearance: She is well-developed. She is not ill-appearing, toxic-appearing or diaphoretic.  Interventions: Face mask in place.  HENT:     Head: Normocephalic and atraumatic.     Mouth/Throat:     Mouth: Mucous membranes are moist.     Pharynx: Oropharynx is clear.  Eyes:     General: No scleral icterus.    Pupils: Pupils are equal, round, and reactive to light.   Cardiovascular:     Rate and Rhythm: Normal rate and regular rhythm.     Heart sounds: Normal heart sounds. No murmur heard. Pulmonary:     Effort: Pulmonary effort is normal. No respiratory distress.     Breath sounds: Normal breath sounds. No wheezing or rales.  Chest:     Chest wall: No tenderness.  Abdominal:     General: Bowel sounds are normal. There is no distension.     Palpations: Abdomen is soft. There is no hepatomegaly, splenomegaly or mass.     Tenderness: There is no abdominal tenderness. There is no guarding or rebound.  Musculoskeletal:        General: No tenderness. Normal range of motion.     Cervical back: Normal range of motion and neck supple.     Right lower leg: No edema.     Left lower leg: No edema.  Lymphadenopathy:     Head:     Right side of head: No preauricular, posterior auricular or occipital adenopathy.     Left side of head: No preauricular, posterior auricular or occipital adenopathy.     Cervical: No cervical adenopathy.     Upper Body:     Right upper body: No supraclavicular adenopathy.     Left upper body: No supraclavicular adenopathy.  Skin:    General: Skin is warm and dry.  Neurological:     Mental Status: She is alert and oriented to person, place, and time. Mental status is at baseline.  Psychiatric:        Mood and Affect: Mood normal.   Laboratory data Infusion on 09/06/2021  Component Date Value Ref Range Status   Magnesium 09/06/2021 1.1 (L)  1.7 - 2.4 mg/dL Final   Performed at [facility], [place], [place] [address]   Sodium 09/06/2021 134 (L)  135 - 145 mmol/L Final   Potassium 09/06/2021 3.7  3.5 - 5.1 mmol/L Final   Chloride 09/06/2021 102  98 - 111 mmol/L Final   CO2 09/06/2021 19 (L)  22 - 32 mmol/L Final   Glucose, Bld 09/06/2021 191 (H)  70 - 99 mg/dL Final   Glucose reference range applies only to samples taken after fasting for at least 8 hours.   BUN 09/06/2021 32 (H)  8 - 23 mg/dL Final    Creatinine, Ser 09/06/2021 1.04 (H)  0.44 - 1.00 mg/dL Final   Calcium 09/06/2021 9.4  8.9 - 10.3 mg/dL Final   Total Protein 09/06/2021 7.1  6.5 - 8.1 g/dL Final   Albumin 09/06/2021 4.1  3.5 - 5.0 g/dL Final   AST 09/06/2021 41  15 - 41 U/L Final   ALT 09/06/2021 50 (H)  0 - 44 U/L Final   Alkaline Phosphatase 09/06/2021 97  38 - 126 U/L Final   Total Bilirubin 09/06/2021 0.6  0.3 - 1.2 mg/dL Final   GFR, Estimated 09/06/2021 >60  >60 mL/min Final   Comment: (NOTE) Calculated using the CKD-EPI Creatinine Equation (2021)    Anion gap 09/06/2021 13  5 - 15 Final   Performed at [facility], [address] [place], [place] [address]   WBC 09/06/2021 6.1  4.0 - 10.5 K/uL Final   RBC 09/06/2021 4.26  3.87 - 5.11 MIL/uL Final   Hemoglobin 09/06/2021 11.7 (L)  12.0 - 15.0 g/dL Final   HCT 09/06/2021 35.2 (L)  36.0 - 46.0 % Final   MCV 09/06/2021 82.6  80.0 - 100.0 fL Final   MCH 09/06/2021 27.5  26.0 - 34.0 pg Final   MCHC 09/06/2021 33.2  30.0 - 36.0 g/dL Final   RDW 09/06/2021 15.7 (H)  11.5 - 15.5 % Final   Platelets 09/06/2021 176  150 - 400 K/uL Final   nRBC 09/06/2021 0.0  0.0 - 0.2 % Final   Neutrophils Relative % 09/06/2021 68  % Final   Neutro Abs 09/06/2021 4.2  1.7 - 7.7 K/uL Final   Lymphocytes Relative 09/06/2021 20  % Final   Lymphs Abs 09/06/2021 1.2  0.7 - 4.0 K/uL Final   Monocytes Relative 09/06/2021 9  % Final   Monocytes Absolute 09/06/2021 0.5  0.1 - 1.0 K/uL Final   Eosinophils Relative 09/06/2021 2  % Final   Eosinophils Absolute 09/06/2021 0.1  0.0 - 0.5 K/uL Final   Basophils Relative 09/06/2021 1  % Final   Basophils Absolute 09/06/2021 0.1  0.0 - 0.1 K/uL Final   Immature Granulocytes 09/06/2021 0  % Final   Abs Immature Granulocytes 09/06/2021 0.02  0.00 - 0.07 K/uL Final   Performed at [facility], [address] [place], [place] [address]    Assessment/Plan 1. Encounter for antineoplastic chemotherapy   2. Multiple myeloma in  remission ([place])   3. 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[place])   4. Normocytic anemia   5. Hypomagnesemia   6. B12 deficiency     # Recurrent lambda light chain multiple myeloma s/p second autologous bone marrow transplant [05/10/2020] Most recent Multiple myeloma showed negative M protein.  Light chain ratio is normal. 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  Attention on follow-up. Labs are reviewed and discussed with patient. Continue Daratumumab injection Q4 weeks. Dexamethasone 73m x 1.  Stable myeloma labs.  Continue acyclovir prophylaxis.  [facility] ([phone].  #Hyperglobulinemia post stem cell transplantation. S/p IVIG 4053mkg x 1 recently. Last IgG level was 598 Monitor monthly. Plan additional IVIG if level drops below 500.   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   Follows up with neurology.  Now on nortriptyline.  #chronic diarrhea,antidiarrhea PRN.  #Chronic hypomagnesemia Magnesium is 1.1 today, proceed with  IV magnesium today. 6 g Continue weekly magnesium +/- IV magnesium.   Continue Slow-Mag 1 tab every other day.   #Dysphagia, chronic nausea, patient request to have IV Phenergan in the clinic with treatment.    # B12 deficiency, check B12 level today. Empiric B12 injection today.   #CKD, stale creatinine.   RTC: weekly  lab magnesium/IV magnesium weekly x  3 RTC in 4 weeks for labs (CBC with diff, CMP, Mg ), md  for Daratumumab  She may proceed with epidural steroid injection.  I discussed the assessment and treatment plan with the patient.  The patient was provided an opportunity to ask questions and all were answered.  The patient agreed with the plan and demonstrated an understanding of the instructions.  The patient was advised to call back if the symptoms  worsen or if the  condition fails to improve as anticipated.   Earlie Server, MD, PhD 09/06/2021

## 2021-09-07 ENCOUNTER — Encounter: Payer: Self-pay | Admitting: Oncology

## 2021-09-07 LAB — KAPPA/LAMBDA LIGHT CHAINS
Kappa free light chain: 4.6 mg/L (ref 3.3–19.4)
Kappa, lambda light chain ratio: 0.64 (ref 0.26–1.65)
Lambda free light chains: 7.2 mg/L (ref 5.7–26.3)

## 2021-09-07 NOTE — Addendum Note (Signed)
Addended by: Earlie Server on: 09/07/2021 09:08 PM ? ? Modules accepted: Orders ? ?

## 2021-09-10 ENCOUNTER — Other Ambulatory Visit: Payer: Self-pay | Admitting: Internal Medicine

## 2021-09-10 LAB — MULTIPLE MYELOMA PANEL, SERUM
Albumin SerPl Elph-Mcnc: 3.8 g/dL (ref 2.9–4.4)
Albumin/Glob SerPl: 1.4 (ref 0.7–1.7)
Alpha 1: 0.3 g/dL (ref 0.0–0.4)
Alpha2 Glob SerPl Elph-Mcnc: 1 g/dL (ref 0.4–1.0)
B-Globulin SerPl Elph-Mcnc: 1 g/dL (ref 0.7–1.3)
Gamma Glob SerPl Elph-Mcnc: 0.6 g/dL (ref 0.4–1.8)
Globulin, Total: 2.9 g/dL (ref 2.2–3.9)
IgA: 20 mg/dL — ABNORMAL LOW (ref 87–352)
IgG (Immunoglobin G), Serum: 537 mg/dL — ABNORMAL LOW (ref 586–1602)
IgM (Immunoglobulin M), Srm: 17 mg/dL — ABNORMAL LOW (ref 26–217)
Total Protein ELP: 6.7 g/dL (ref 6.0–8.5)

## 2021-09-12 ENCOUNTER — Inpatient Hospital Stay: Payer: Medicare HMO

## 2021-09-12 ENCOUNTER — Other Ambulatory Visit: Payer: Self-pay

## 2021-09-12 ENCOUNTER — Inpatient Hospital Stay: Payer: Medicare HMO | Attending: Oncology

## 2021-09-12 DIAGNOSIS — Z79899 Other long term (current) drug therapy: Secondary | ICD-10-CM | POA: Insufficient documentation

## 2021-09-12 DIAGNOSIS — C9001 Multiple myeloma in remission: Secondary | ICD-10-CM | POA: Insufficient documentation

## 2021-09-12 DIAGNOSIS — C9000 Multiple myeloma not having achieved remission: Secondary | ICD-10-CM

## 2021-09-12 LAB — MAGNESIUM: Magnesium: 1.3 mg/dL — ABNORMAL LOW (ref 1.7–2.4)

## 2021-09-12 MED ORDER — SODIUM CHLORIDE 0.9 % IV SOLN: 6.0000 g | Freq: Once | INTRAVENOUS | Status: DC

## 2021-09-12 MED ORDER — HEPARIN SOD (PORK) LOCK FLUSH 100 UNIT/ML IV SOLN
500.0000 [IU] | Freq: Once | INTRAVENOUS | Status: AC
  Administered 2021-09-12: 500 [IU] via INTRAVENOUS
  Filled 2021-09-12: qty 5

## 2021-09-12 MED ORDER — SODIUM CHLORIDE 0.9 % IV SOLN
Freq: Once | INTRAVENOUS | Status: AC
  Filled 2021-09-12: qty 250

## 2021-09-12 MED ORDER — MAGNESIUM SULFATE 4 GM/100ML IV SOLN
4.0000 g | Freq: Once | INTRAVENOUS | Status: AC
  Administered 2021-09-12: 4 g via INTRAVENOUS
  Filled 2021-09-12: qty 100

## 2021-09-12 MED ORDER — SODIUM CHLORIDE 0.9% FLUSH
10.0000 mL | Freq: Once | INTRAVENOUS | Status: AC
  Administered 2021-09-12: 10 mL via INTRAVENOUS
  Filled 2021-09-12: qty 10

## 2021-09-12 MED ORDER — SODIUM CHLORIDE 0.9 % IV SOLN
25.0000 mg | Freq: Once | INTRAVENOUS | Status: AC
  Administered 2021-09-12: 25 mg via INTRAVENOUS
  Filled 2021-09-12: qty 1

## 2021-09-12 MED ORDER — MAGNESIUM SULFATE 2 GM/50ML IV SOLN
2.0000 g | Freq: Once | INTRAVENOUS | Status: AC
  Administered 2021-09-12: 2 g via INTRAVENOUS
  Filled 2021-09-12: qty 50

## 2021-09-19 ENCOUNTER — Inpatient Hospital Stay: Payer: Medicare HMO

## 2021-09-19 ENCOUNTER — Other Ambulatory Visit: Payer: Self-pay

## 2021-09-19 DIAGNOSIS — E538 Deficiency of other specified B group vitamins: Secondary | ICD-10-CM | POA: Diagnosis not present

## 2021-09-19 DIAGNOSIS — Z5112 Encounter for antineoplastic immunotherapy: Secondary | ICD-10-CM | POA: Diagnosis not present

## 2021-09-19 DIAGNOSIS — G620 Drug-induced polyneuropathy: Secondary | ICD-10-CM | POA: Diagnosis not present

## 2021-09-19 DIAGNOSIS — I130 Hypertensive heart and chronic kidney disease with heart failure and stage 1 through stage 4 chronic kidney disease, or unspecified chronic kidney disease: Secondary | ICD-10-CM | POA: Diagnosis not present

## 2021-09-19 DIAGNOSIS — T451X5A Adverse effect of antineoplastic and immunosuppressive drugs, initial encounter: Secondary | ICD-10-CM | POA: Diagnosis not present

## 2021-09-19 DIAGNOSIS — D801 Nonfamilial hypogammaglobulinemia: Secondary | ICD-10-CM | POA: Diagnosis not present

## 2021-09-19 DIAGNOSIS — D649 Anemia, unspecified: Secondary | ICD-10-CM | POA: Diagnosis not present

## 2021-09-19 DIAGNOSIS — C9001 Multiple myeloma in remission: Secondary | ICD-10-CM | POA: Diagnosis not present

## 2021-09-19 DIAGNOSIS — C9000 Multiple myeloma not having achieved remission: Secondary | ICD-10-CM

## 2021-09-19 LAB — MAGNESIUM: Magnesium: 1.1 mg/dL — ABNORMAL LOW (ref 1.7–2.4)

## 2021-09-19 MED ORDER — SODIUM CHLORIDE 0.9% FLUSH
10.0000 mL | Freq: Once | INTRAVENOUS | Status: AC | PRN
  Administered 2021-09-19: 10 mL
  Filled 2021-09-19: qty 10

## 2021-09-19 MED ORDER — SODIUM CHLORIDE 0.9 % IV SOLN
25.0000 mg | Freq: Once | INTRAVENOUS | Status: AC
  Administered 2021-09-19: 25 mg via INTRAVENOUS
  Filled 2021-09-19: qty 1

## 2021-09-19 MED ORDER — SODIUM CHLORIDE 0.9 % IV SOLN: 25.0000 mg | Freq: Once | INTRAVENOUS | Status: DC

## 2021-09-19 MED ORDER — MAGNESIUM SULFATE 4 GM/100ML IV SOLN
4.0000 g | Freq: Once | INTRAVENOUS | Status: AC
  Administered 2021-09-19: 4 g via INTRAVENOUS

## 2021-09-19 MED ORDER — SODIUM CHLORIDE 0.9 % IV SOLN: 6.0000 g | Freq: Once | INTRAVENOUS | Status: DC

## 2021-09-19 MED ORDER — SODIUM CHLORIDE 0.9 % IV SOLN
Freq: Once | INTRAVENOUS | Status: AC
  Filled 2021-09-19: qty 250

## 2021-09-19 MED ORDER — MAGNESIUM SULFATE 2 GM/50ML IV SOLN
2.0000 g | Freq: Once | INTRAVENOUS | Status: AC
  Administered 2021-09-19: 2 g via INTRAVENOUS

## 2021-09-19 MED ORDER — HEPARIN SOD (PORK) LOCK FLUSH 100 UNIT/ML IV SOLN
500.0000 [IU] | Freq: Once | INTRAVENOUS | Status: AC | PRN
  Administered 2021-09-19: 500 [IU]
  Filled 2021-09-19: qty 5

## 2021-09-19 NOTE — Progress Notes (Signed)
Magnesium 1.1 today. Received 6 grams of magnesium over 3 hours. Premedicated with 25 mg phenergan IV piggyback. Tolerated infusion well. ?

## 2021-09-20 ENCOUNTER — Other Ambulatory Visit: Payer: Self-pay | Admitting: Internal Medicine

## 2021-09-20 ENCOUNTER — Encounter: Payer: Self-pay | Admitting: Internal Medicine

## 2021-09-20 DIAGNOSIS — M545 Low back pain, unspecified: Secondary | ICD-10-CM | POA: Diagnosis not present

## 2021-09-20 DIAGNOSIS — G620 Drug-induced polyneuropathy: Secondary | ICD-10-CM | POA: Diagnosis not present

## 2021-09-20 DIAGNOSIS — T451X5A Adverse effect of antineoplastic and immunosuppressive drugs, initial encounter: Secondary | ICD-10-CM | POA: Diagnosis not present

## 2021-09-20 DIAGNOSIS — M542 Cervicalgia: Secondary | ICD-10-CM | POA: Diagnosis not present

## 2021-09-20 DIAGNOSIS — E118 Type 2 diabetes mellitus with unspecified complications: Secondary | ICD-10-CM

## 2021-09-20 DIAGNOSIS — R200 Anesthesia of skin: Secondary | ICD-10-CM | POA: Diagnosis not present

## 2021-09-20 DIAGNOSIS — R202 Paresthesia of skin: Secondary | ICD-10-CM | POA: Diagnosis not present

## 2021-09-20 MED ORDER — GLIPIZIDE ER 2.5 MG PO TB24: 2.5000 mg | ORAL_TABLET | Freq: Every day | ORAL | 1 refills | Status: DC

## 2021-09-21 ENCOUNTER — Other Ambulatory Visit: Payer: Self-pay | Admitting: Oncology

## 2021-09-21 ENCOUNTER — Telehealth: Payer: Self-pay

## 2021-09-21 NOTE — Telephone Encounter (Signed)
Called pt and informed her of plan and gave her appt details.  ?

## 2021-09-21 NOTE — Telephone Encounter (Signed)
-----   Message from Earlie Server, MD sent at 09/21/2021 12:03 AM EDT ----- ?Please schedule her to get IVIG along with her next lab Mag visit.  ? ? ?

## 2021-09-21 NOTE — Telephone Encounter (Signed)
Possibility of IVIG discussed with pt during visit. She received before in the hospital. Mychart message to inform her of this as well.  ? ?Please add IVIG to her next lab/mag visit. Thanks  ?

## 2021-09-24 ENCOUNTER — Other Ambulatory Visit: Payer: Self-pay | Admitting: Internal Medicine

## 2021-09-24 DIAGNOSIS — F5101 Primary insomnia: Secondary | ICD-10-CM

## 2021-09-24 DIAGNOSIS — M4726 Other spondylosis with radiculopathy, lumbar region: Secondary | ICD-10-CM | POA: Diagnosis not present

## 2021-09-24 DIAGNOSIS — M5136 Other intervertebral disc degeneration, lumbar region: Secondary | ICD-10-CM | POA: Diagnosis not present

## 2021-09-24 DIAGNOSIS — Z862 Personal history of diseases of the blood and blood-forming organs and certain disorders involving the immune mechanism: Secondary | ICD-10-CM | POA: Diagnosis not present

## 2021-09-24 DIAGNOSIS — M5416 Radiculopathy, lumbar region: Secondary | ICD-10-CM | POA: Diagnosis not present

## 2021-09-25 NOTE — Telephone Encounter (Signed)
Requested Prescriptions  ?Pending Prescriptions Disp Refills  ?? traZODone (DESYREL) 100 MG tablet [Pharmacy Med Name: TRAZODONE HYDROCHLORIDE 100 MG Tablet] 90 tablet 0  ?  Sig: TAKE 1 TABLET AT BEDTIME  ?  ? Psychiatry: Antidepressants - Serotonin Modulator Passed - 09/24/2021  3:34 AM  ?  ?  Passed - Completed PHQ-2 or PHQ-9 in the last 360 days  ?  ?  Passed - Valid encounter within last 6 months  ?  Recent Outpatient Visits   ?      ? 3 months ago Viral URI with cough  ? [facility] [place] Glean Hess, MD  ? 4 months ago Exposure to influenza  ? [facility] Glean Hess, MD  ? 6 months ago Pneumonia of right lower lobe due to infectious organism  ? [facility] Glean Hess, MD  ? 9 months ago Annual physical exam  ? [facility] Glean Hess, MD  ? 1 year ago Benign essential HTN  ? [facility] Glean Hess, MD  ?  ?  ?Future Appointments   ?        ? In 2 months [employer] Jesse Sans, MD [facility] [place]  ?  ? ?  ?  ?  ? ?

## 2021-09-26 ENCOUNTER — Encounter: Payer: Self-pay | Admitting: Oncology

## 2021-09-26 ENCOUNTER — Inpatient Hospital Stay: Payer: Medicare HMO

## 2021-09-26 ENCOUNTER — Ambulatory Visit: Payer: Medicare HMO

## 2021-09-26 NOTE — Telephone Encounter (Addendum)
Please see pt's request. Ok to r/s today's appt if there is availability on Friday.  ?

## 2021-09-26 NOTE — Telephone Encounter (Signed)
Sounds good. Thank you

## 2021-09-26 NOTE — Telephone Encounter (Signed)
Per Dr. Tasia Catchings, please offer tomorrow since there is no availability on Friday. If she cant' do tomorrow or no room, we can move next week's appt on 4/6 to earlier in the week. IVIG D2?  ?

## 2021-09-26 NOTE — Telephone Encounter (Signed)
Pt requesting to move lab/ IVIG/ mag to Friday but here is not room in infusion. She is already scheduled next week for tx an mag. Please advise.  ?

## 2021-09-30 ENCOUNTER — Other Ambulatory Visit: Payer: Self-pay | Admitting: Oncology

## 2021-10-01 ENCOUNTER — Inpatient Hospital Stay: Payer: Medicare HMO

## 2021-10-01 ENCOUNTER — Inpatient Hospital Stay: Payer: Medicare HMO | Attending: Oncology | Admitting: Oncology

## 2021-10-01 ENCOUNTER — Encounter: Payer: Self-pay | Admitting: Oncology

## 2021-10-01 VITALS — BP 133/75 | HR 70 | Temp 96.3°F | Resp 20 | Wt 155.3 lb

## 2021-10-01 DIAGNOSIS — K219 Gastro-esophageal reflux disease without esophagitis: Secondary | ICD-10-CM | POA: Diagnosis not present

## 2021-10-01 DIAGNOSIS — C9001 Multiple myeloma in remission: Secondary | ICD-10-CM | POA: Insufficient documentation

## 2021-10-01 DIAGNOSIS — Z87891 Personal history of nicotine dependence: Secondary | ICD-10-CM | POA: Diagnosis not present

## 2021-10-01 DIAGNOSIS — E538 Deficiency of other specified B group vitamins: Secondary | ICD-10-CM | POA: Diagnosis not present

## 2021-10-01 DIAGNOSIS — Z9484 Stem cells transplant status: Secondary | ICD-10-CM | POA: Diagnosis not present

## 2021-10-01 DIAGNOSIS — R110 Nausea: Secondary | ICD-10-CM

## 2021-10-01 DIAGNOSIS — Z801 Family history of malignant neoplasm of trachea, bronchus and lung: Secondary | ICD-10-CM | POA: Diagnosis not present

## 2021-10-01 DIAGNOSIS — D709 Neutropenia, unspecified: Secondary | ICD-10-CM | POA: Diagnosis not present

## 2021-10-01 DIAGNOSIS — Z5111 Encounter for antineoplastic chemotherapy: Secondary | ICD-10-CM | POA: Diagnosis not present

## 2021-10-01 DIAGNOSIS — N1831 Chronic kidney disease, stage 3a: Secondary | ICD-10-CM | POA: Insufficient documentation

## 2021-10-01 DIAGNOSIS — Z8379 Family history of other diseases of the digestive system: Secondary | ICD-10-CM | POA: Insufficient documentation

## 2021-10-01 DIAGNOSIS — D649 Anemia, unspecified: Secondary | ICD-10-CM | POA: Diagnosis not present

## 2021-10-01 DIAGNOSIS — M2550 Pain in unspecified joint: Secondary | ICD-10-CM | POA: Diagnosis not present

## 2021-10-01 DIAGNOSIS — Z833 Family history of diabetes mellitus: Secondary | ICD-10-CM | POA: Insufficient documentation

## 2021-10-01 DIAGNOSIS — T451X5A Adverse effect of antineoplastic and immunosuppressive drugs, initial encounter: Secondary | ICD-10-CM

## 2021-10-01 DIAGNOSIS — R112 Nausea with vomiting, unspecified: Secondary | ICD-10-CM | POA: Insufficient documentation

## 2021-10-01 DIAGNOSIS — Z822 Family history of deafness and hearing loss: Secondary | ICD-10-CM | POA: Insufficient documentation

## 2021-10-01 DIAGNOSIS — Z800 Family history of malignant neoplasm of digestive organs: Secondary | ICD-10-CM | POA: Diagnosis not present

## 2021-10-01 DIAGNOSIS — Z8249 Family history of ischemic heart disease and other diseases of the circulatory system: Secondary | ICD-10-CM | POA: Diagnosis not present

## 2021-10-01 DIAGNOSIS — Z79899 Other long term (current) drug therapy: Secondary | ICD-10-CM | POA: Diagnosis not present

## 2021-10-01 DIAGNOSIS — Z5112 Encounter for antineoplastic immunotherapy: Secondary | ICD-10-CM | POA: Insufficient documentation

## 2021-10-01 DIAGNOSIS — R771 Abnormality of globulin: Secondary | ICD-10-CM | POA: Diagnosis not present

## 2021-10-01 DIAGNOSIS — K529 Noninfective gastroenteritis and colitis, unspecified: Secondary | ICD-10-CM | POA: Insufficient documentation

## 2021-10-01 DIAGNOSIS — D631 Anemia in chronic kidney disease: Secondary | ICD-10-CM | POA: Diagnosis not present

## 2021-10-01 DIAGNOSIS — I10 Essential (primary) hypertension: Secondary | ICD-10-CM | POA: Insufficient documentation

## 2021-10-01 DIAGNOSIS — Z808 Family history of malignant neoplasm of other organs or systems: Secondary | ICD-10-CM | POA: Diagnosis not present

## 2021-10-01 DIAGNOSIS — Z803 Family history of malignant neoplasm of breast: Secondary | ICD-10-CM | POA: Insufficient documentation

## 2021-10-01 DIAGNOSIS — M549 Dorsalgia, unspecified: Secondary | ICD-10-CM | POA: Insufficient documentation

## 2021-10-01 DIAGNOSIS — G620 Drug-induced polyneuropathy: Secondary | ICD-10-CM

## 2021-10-01 DIAGNOSIS — D801 Nonfamilial hypogammaglobulinemia: Secondary | ICD-10-CM | POA: Insufficient documentation

## 2021-10-01 DIAGNOSIS — Z841 Family history of disorders of kidney and ureter: Secondary | ICD-10-CM | POA: Diagnosis not present

## 2021-10-01 DIAGNOSIS — C9000 Multiple myeloma not having achieved remission: Secondary | ICD-10-CM

## 2021-10-01 LAB — CBC WITH DIFFERENTIAL/PLATELET
Abs Immature Granulocytes: 0.01 10*3/uL (ref 0.00–0.07)
Basophils Absolute: 0 10*3/uL (ref 0.0–0.1)
Basophils Relative: 0 %
Eosinophils Absolute: 0.1 10*3/uL (ref 0.0–0.5)
Eosinophils Relative: 2 %
HCT: 34 % — ABNORMAL LOW (ref 36.0–46.0)
Hemoglobin: 11.3 g/dL — ABNORMAL LOW (ref 12.0–15.0)
Immature Granulocytes: 0 %
Lymphocytes Relative: 24 %
Lymphs Abs: 1.5 10*3/uL (ref 0.7–4.0)
MCH: 27.4 pg (ref 26.0–34.0)
MCHC: 33.2 g/dL (ref 30.0–36.0)
MCV: 82.3 fL (ref 80.0–100.0)
Monocytes Absolute: 0.4 10*3/uL (ref 0.1–1.0)
Monocytes Relative: 7 %
Neutro Abs: 4.1 10*3/uL (ref 1.7–7.7)
Neutrophils Relative %: 67 %
Platelets: 119 10*3/uL — ABNORMAL LOW (ref 150–400)
RBC: 4.13 MIL/uL (ref 3.87–5.11)
RDW: 14.7 % (ref 11.5–15.5)
WBC: 6.1 10*3/uL (ref 4.0–10.5)
nRBC: 0 % (ref 0.0–0.2)

## 2021-10-01 LAB — COMPREHENSIVE METABOLIC PANEL
ALT: 81 U/L — ABNORMAL HIGH (ref 0–44)
AST: 51 U/L — ABNORMAL HIGH (ref 15–41)
Albumin: 4 g/dL (ref 3.5–5.0)
Alkaline Phosphatase: 104 U/L (ref 38–126)
Anion gap: 9 (ref 5–15)
BUN: 28 mg/dL — ABNORMAL HIGH (ref 8–23)
CO2: 20 mmol/L — ABNORMAL LOW (ref 22–32)
Calcium: 8.1 mg/dL — ABNORMAL LOW (ref 8.9–10.3)
Chloride: 107 mmol/L (ref 98–111)
Creatinine, Ser: 1.04 mg/dL — ABNORMAL HIGH (ref 0.44–1.00)
GFR, Estimated: >60 mL/min (ref >60)
Glucose, Bld: 157 mg/dL — ABNORMAL HIGH (ref 70–99)
Potassium: 3.7 mmol/L (ref 3.5–5.1)
Sodium: 136 mmol/L (ref 135–145)
Total Bilirubin: 0.3 mg/dL (ref 0.3–1.2)
Total Protein: 6.2 g/dL — ABNORMAL LOW (ref 6.5–8.1)

## 2021-10-01 LAB — MAGNESIUM: Magnesium: 1.1 mg/dL — ABNORMAL LOW (ref 1.7–2.4)

## 2021-10-01 MED ORDER — SODIUM CHLORIDE 0.9% FLUSH
10.0000 mL | Freq: Once | INTRAVENOUS | Status: AC
  Administered 2021-10-01: 10 mL via INTRAVENOUS
  Filled 2021-10-01: qty 10

## 2021-10-01 MED ORDER — DEXAMETHASONE 4 MG PO TABS
20.0000 mg | ORAL_TABLET | Freq: Once | ORAL | Status: AC
  Administered 2021-10-01: 20 mg via ORAL
  Filled 2021-10-01: qty 5

## 2021-10-01 MED ORDER — SODIUM CHLORIDE 0.9 % IV SOLN
Freq: Once | INTRAVENOUS | Status: AC
  Filled 2021-10-01: qty 250

## 2021-10-01 MED ORDER — HEPARIN SOD (PORK) LOCK FLUSH 100 UNIT/ML IV SOLN
500.0000 [IU] | Freq: Once | INTRAVENOUS | Status: AC
  Administered 2021-10-01: 500 [IU] via INTRAVENOUS
  Filled 2021-10-01: qty 5

## 2021-10-01 MED ORDER — DIPHENHYDRAMINE HCL 25 MG PO CAPS
25.0000 mg | ORAL_CAPSULE | Freq: Once | ORAL | Status: AC
  Administered 2021-10-01: 25 mg via ORAL
  Filled 2021-10-01: qty 1

## 2021-10-01 MED ORDER — MAGNESIUM SULFATE 2 GM/50ML IV SOLN
2.0000 g | Freq: Once | INTRAVENOUS | Status: AC
  Administered 2021-10-01: 2 g via INTRAVENOUS
  Filled 2021-10-01: qty 50

## 2021-10-01 MED ORDER — MAGNESIUM SULFATE 4 GM/100ML IV SOLN
4.0000 g | Freq: Once | INTRAVENOUS | Status: AC
  Administered 2021-10-01: 4 g via INTRAVENOUS
  Filled 2021-10-01: qty 100

## 2021-10-01 MED ORDER — CYANOCOBALAMIN 1000 MCG/ML IJ SOLN
1000.0000 ug | Freq: Once | INTRAMUSCULAR | Status: AC
  Administered 2021-10-01: 1000 ug via INTRAMUSCULAR
  Filled 2021-10-01: qty 1

## 2021-10-01 MED ORDER — SODIUM CHLORIDE 0.9 % IV SOLN: 6.0000 g | Freq: Once | INTRAVENOUS | Status: DC

## 2021-10-01 MED ORDER — SODIUM CHLORIDE 0.9 % IV SOLN
25.0000 mg | Freq: Once | INTRAVENOUS | Status: AC
  Administered 2021-10-01: 25 mg via INTRAVENOUS
  Filled 2021-10-01: qty 1

## 2021-10-01 MED ORDER — MONTELUKAST SODIUM 10 MG PO TABS
10.0000 mg | ORAL_TABLET | Freq: Once | ORAL | Status: AC
  Administered 2021-10-01: 10 mg via ORAL
  Filled 2021-10-01: qty 1

## 2021-10-01 MED ORDER — DARATUMUMAB-HYALURONIDASE-FIHJ 1800-30000 MG-UT/15ML ~~LOC~~ SOLN
1800.0000 mg | Freq: Once | SUBCUTANEOUS | Status: AC
  Administered 2021-10-01: 1800 mg via SUBCUTANEOUS
  Filled 2021-10-01: qty 15

## 2021-10-01 NOTE — Progress Notes (Signed)
?Hematology/Oncology Progress note ?[phone] [phone] ?  ? ?  ? ? ?Clinic Day:  10/01/2021  ? ?Referring physician: Glean Hess, MD ? ?Chief Complaint: Sarah [name] is a 66 y.o. 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 ? ?PERTINENT ONCOLOGY HISTORY ?Sarah [name] is a 66 y.o.afemale who has above oncology history reviewed by me today presented for follow up visit for management of multiple myeloma ?Patient previously followed up by Dr.Corcoran, patient switched care to me on 11/23/20 ?Extensive medical record review was performed by me ? 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   Initial bone marrow revealed 80% plasma cells.  Lambda free light chains were 1340.  She had nephrotic range proteinuria.  She initially underwent induction with RVD.  Revlimid maintenance was discontinued on 01/21/2017 secondary to intolerance.   ?  ?07/19/2019 Bone marrow aspirate and biopsy on  revealed a normocellular marrow with but increased lambda-restricted plasma cells (9% aspirate, 40% CD138 immunohistochemistry).  Findings were consistent with recurrent plasma cell myeloma.  Flow cytometry revealed no monoclonal B-cell or phenotypically aberrant T-cell population. Cytogenetics were 27, XX (normal).  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 ?   ?Lambda light chains have been followed: 22.2 (ratio 0.56) on 07/03/2017, 30.8 (ratio 0.78) on 09/02/2017, 36.9 (ratio 0.40) on 10/21/2017, 37.4 (ratio 0.41) on 12/16/2017, 70.7(ratio 0.31)  on 02/17/2018, 64.2 (ratio 0.27) on 04/07/2018, 78.9 (ratio 0.18) on 05/26/2018, 128.8 (ratio 0.17) on 08/06/2018, 181.5 (ratio 0.13) on 10/08/2018, 130.9 (ratio 0.13) on 10/20/2018, 160.7 (ratio  0.10) on 12/09/2018, 236.6 (ratio 0.07) on 02/01/2019, 363.6 (ratio 0.04) on 03/22/2019, 404.8 (ratio 0.04) on 04/05/2019, 420.7 (ratio 0.03) on 05/24/2019, 573.4 (ratio 0.03) on 06/23/2019, 451.05 (ratio 0.02) on 08/20/2019, 47.2 (ratio 0.13) on 10/25/2019, 22.4 (ratio 0.21) on 11/25/2019, 16.5 (ratio 0.33) on 01/10/2020, 14.6 (ratio 0.34) on 02/07/2020, 13.1 (ratio 0.31) on 03/07/2020, 10.1 (ratio 0.38) on 04/10/2020, and 9.5 (ratio 0.21) on 06/19/2020. ?  ?24 hour UPEP on 06/03/2019 revealed kappa free light chains 95.76, lambda free light chains 1,260.71, and ratio 0.08.  24 hour UPEP on 08/23/2019 revealed total protein of 782 mg/24 hrs with lambda free light chains 1,084.16 mg/L and ratio of 0.10 (1.03-31.76).  M spike in urine was 46.1% (361 mg/24 hrs).  ?  ?Bone survey on 04/08/2016 and 05/28/2017 revealed no definite lytic lesion seen in the visualized skeleton.  Bone survey on 11/19/2018 revealed no suspicious lucent lesions and no acute bony abnormality.  PET scan on 07/12/2019 revealed no focal metabolic activity to suggest active myeloma within the skeleton. There were no lytic lesions identified on the CT portion of the exam or soft tissue plasmacytomas. There was no evidence of multiple myeloma.  ?  ?Pretreatment RBC phenotype on 09/23/2019 was positive for C, e, DUFFY B, KIDD B, M, S, and s antigen; negative for c, E, KELL, DUFFY A, KIDD A, and N antigen.  ?  ?09/27/2019 - 10/25/2019; 12/09/2019 - 03/13/2020 6 cycles of daratumumab and hyaluronidase-fihj, Pomalyst, and Decadron (DPd) .  Cycle #1 was complicated by fever and neutropenia requiring admission.  Cycle #2 was complicated by pneumonia requiring admission.  Cycle #6 was complicated with an ER evaluation for an elevated lactic acid. ?  ?05/10/2020 second autologous stem cell transplant at  [facility]   She underwent conditioning with melphalan 140 mg/m2.   ? ?She is s/p week #3 Velcade maintenance (began 08/31/2020 - 09/28/2020).  She has no increase  in neuropathy. ? ?She has severe aortic stenosis.  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 Cardiology is following for a possible TAVR in the future.  Echo on 07/05/2020 revealed moderate to severe aortic stenosis with an EF of 50-55%. ? 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 Zometa was discontinued in 01/2017.  She has chronic nausea on Phenergan.   ?  ?B12 deficiency.  B12 was 254 on 04/09/2017, 295 on 08/20/2018, and 391 on 10/08/2018.  She was on oral B12.  She received B12 monthly (last 09/14/2020).  Folate was 12.1 on 01/10/2020.  ?  ?She has iron deficiency.  Ferritin was 32 on 07/01/2019.  She received Venofer on 07/15/2019 and 07/22/2019. ?  ?She has hypogammaglobulinemia.  IgG was 245 on 11/25/2019.  She received monthly IVIG (07/22/021 - 03/22/2020).  She received IVIG 400 mg/kg on 01/20/2020, 200 mg/kg on 02/17/2020, and 300 mg/kg on 03/22/2020.  IVIG on 66/44/0347 was complicated by acute renal failure. IgG trough level was 418 on 02/17/2020. ? ?10/15/2019 - 10/20/2019 She was admitted to [facility] with fever and neutropenia.  Cultures were negative.  CXR was negative. She received broad spectrum antibiotics and daily Granix.  She received IVF for acute renal insufficiency due to diarrhea and dehydration.  Creatine was 1.66 on admission and 1.12 on discharge. ?  ? 03/01/2020 - 03/05/2020 admitted to [facility] from with fever and neutropenia. CXR revealed no active cardiopulmonary disease. Chest CT with contrast revealed no acute intrathoracic pathology. There were findings which could be suggestive of prior granulomatous disease. She was treated with Cefepime and Vancomycin, then switched to ciprofloxacin on 03/03/2020.  ? ?05/08/2020 - 12/05/2021The patient was admitted to [facility] from  for autologous bone marrow transplant.   She received conditioning with melphalan 140 mg/m2.  Bone marrow transplant was on  05/10/2020. The patient developed fevers daily from 05/19/2020 - 05/24/2020. Blood cultures were + for strept sanguis bacteremia.  She was treated cefepime, vancomycin, and solumedrol for possible engraftment syndrome. Cefepime was switched to ceftriaxone; she completed 2 weeks of ceftriaxone on 06/03/2020. She had chemotherapy induced diarrhea.  She experienced urinary retention. ? ?Feb 2022 patient has been on maintenance Velcade 2 mg every 2 weeks. ? ?Chronic diarrhea ?seen by gastroenterology Dr. Vicente Males and was recommended to avoid artificial sugars in her diet including Diet Coke and coffee mate.  She feels some improvement of her diarrhea frequency since the dietary modification.  ?GI profile PCR negative, C. difficile toxin A+ B negative, fecal calprotectin 77 ?12/19/2020 colonoscopy showed 2 subcentimeter polyps in the ascending colon, resected and removed.  Pathology showed tubular adenoma.  No malignancy.  Normal mucosa in the entire examined colon.  Biopsied, pathology showed colonic mucosa with no significant pathological alteration.  Negative for active inflammation and features of chronicity.  Negative for microscopic colitis, dysplasia, and malignanc ? ?Diabetes, patient has discontinued metformin due to diarrhea and started on glipizide 2.5 mg ? ? ?03/05/2021-03/09/2021 ?Patient was hospitalized due to fever and chills, nonproductive cough, shortness of breath. ?X-ray showed right lower lobe atelectasis versus pneumonia.  Blood cultures negative.  Urine culture showed 60,000 colonies of Enterococcus facialis.  Patient received antibiotics. ?Patient also had abdominal pelvis CT scan for work-up of intermittent  lower abdomen pain.No acute abnormality.  ? ?05/14/21 last dose of Velcade maintenance.  ?establish care with neurology Dr. Gurney Maxin and her neuropathy regimen has been switched from gabapentin to Lyrica. ?05/29/2021 patient got influenza  ? ?neuropathy medication has been switched from  gabapentin/Lyrica to nortriptyline. ? ?08/18/2021 Hospitalized due to pneumonia from metapnuemovirus, hospitalization was complicated with NSTEMI. She received heparin gtt.  ?Treated with antibiotics for coverage of

## 2021-10-01 NOTE — Patient Instructions (Signed)
[facility]CER CTR AT [place]  Discharge Instructions: ?Thank you for choosing [facility] to provide your oncology and hematology care.  ?If you have a lab appointment with the [facility], please go directly to the [facility] and check in at the registration area. ? ?Wear comfortable clothing and clothing appropriate for easy access to any Portacath or PICC line.  ? ?We strive to give you quality time with your provider. You may need to reschedule your appointment if you arrive late (15 or more minutes).  Arriving late affects you and other patients whose appointments are after yours.  Also, if you miss three or more appointments without notifying the office, you may be dismissed from the clinic at the provider?s discretion.    ?  ?For prescription refill requests, have your pharmacy contact our office and allow 72 hours for refills to be completed.   ? ?Today you received the following chemotherapy and/or immunotherapy agents darzalex SQ ? ?  ?To help prevent nausea and vomiting after your treatment, we encourage you to take your nausea medication as directed. ? ?BELOW ARE SYMPTOMS THAT SHOULD BE REPORTED IMMEDIATELY: ?*FEVER GREATER THAN 100.4 F (38 ?C) OR HIGHER ?*CHILLS OR SWEATING ?*NAUSEA AND VOMITING THAT IS NOT CONTROLLED WITH YOUR NAUSEA MEDICATION ?*UNUSUAL SHORTNESS OF BREATH ?*UNUSUAL BRUISING OR BLEEDING ?*URINARY PROBLEMS (pain or burning when urinating, or frequent urination) ?*BOWEL PROBLEMS (unusual diarrhea, constipation, pain near the anus) ?TENDERNESS IN MOUTH AND THROAT WITH OR WITHOUT PRESENCE OF ULCERS (sore throat, sores in mouth, or a toothache) ?UNUSUAL RASH, SWELLING OR PAIN  ?UNUSUAL VAGINAL DISCHARGE OR ITCHING  ? ?Items with * indicate a potential emergency and should be followed up as soon as possible or go to the Emergency Department if any problems should occur. ? ?Please show the CHEMOTHERAPY ALERT CARD or IMMUNOTHERAPY ALERT CARD at check-in to  the Emergency Department and triage nurse. ? ?Should you have questions after your visit or need to cancel or reschedule your appointment, please contact [facility] CANCER [place] AT [place]  [phone] and follow the prompts.  Office hours are 8:00 a.m. to 4:30 p.m. Monday - Friday. Please note that voicemails left after 4:00 p.m. may not be returned until the following business day.  We are closed weekends and major holidays. You have access to a nurse at all times for urgent questions. Please call the main number to the clinic [phone] and follow the prompts. ? ?For any non-urgent questions, you may also contact your provider using MyChart. We now offer e-Visits for anyone 78 and older to request care online for non-urgent symptoms. For details visit [email or web address]. ?  ?Also download the MyChart app! Go to the app store, search "MyChart", open the app, select [facility], and log in with your MyChart username and password. ? ?Due to Covid, a mask is required upon entering the hospital/clinic. If you do not have a mask, one will be given to you upon arrival. For doctor visits, patients may have 1 support person aged 44 or older with them. For treatment visits, patients cannot have anyone with them due to current Covid guidelines and our immunocompromised population.  ?

## 2021-10-02 ENCOUNTER — Inpatient Hospital Stay: Payer: Medicare HMO

## 2021-10-02 VITALS — BP 130/65 | HR 65 | Temp 96.7°F | Resp 18

## 2021-10-02 DIAGNOSIS — K529 Noninfective gastroenteritis and colitis, unspecified: Secondary | ICD-10-CM | POA: Diagnosis not present

## 2021-10-02 DIAGNOSIS — N1831 Chronic kidney disease, stage 3a: Secondary | ICD-10-CM | POA: Diagnosis not present

## 2021-10-02 DIAGNOSIS — R112 Nausea with vomiting, unspecified: Secondary | ICD-10-CM | POA: Diagnosis not present

## 2021-10-02 DIAGNOSIS — D801 Nonfamilial hypogammaglobulinemia: Secondary | ICD-10-CM | POA: Diagnosis not present

## 2021-10-02 DIAGNOSIS — C9000 Multiple myeloma not having achieved remission: Secondary | ICD-10-CM

## 2021-10-02 DIAGNOSIS — C9001 Multiple myeloma in remission: Secondary | ICD-10-CM | POA: Diagnosis not present

## 2021-10-02 DIAGNOSIS — Z5112 Encounter for antineoplastic immunotherapy: Secondary | ICD-10-CM | POA: Diagnosis not present

## 2021-10-02 DIAGNOSIS — D709 Neutropenia, unspecified: Secondary | ICD-10-CM | POA: Diagnosis not present

## 2021-10-02 DIAGNOSIS — I10 Essential (primary) hypertension: Secondary | ICD-10-CM | POA: Diagnosis not present

## 2021-10-02 DIAGNOSIS — R771 Abnormality of globulin: Secondary | ICD-10-CM | POA: Diagnosis not present

## 2021-10-02 LAB — KAPPA/LAMBDA LIGHT CHAINS
Kappa free light chain: 3.2 mg/L — ABNORMAL LOW (ref 3.3–19.4)
Kappa, lambda light chain ratio: 0.71 (ref 0.26–1.65)
Lambda free light chains: 4.5 mg/L — ABNORMAL LOW (ref 5.7–26.3)

## 2021-10-02 MED ORDER — SODIUM CHLORIDE 0.9 % IV SOLN: 4.0000 g | Freq: Once | INTRAVENOUS | Status: DC

## 2021-10-02 MED ORDER — DIPHENHYDRAMINE HCL 25 MG PO CAPS
25.0000 mg | ORAL_CAPSULE | Freq: Once | ORAL | Status: AC
  Administered 2021-10-02: 25 mg via ORAL
  Filled 2021-10-02: qty 1

## 2021-10-02 MED ORDER — IMMUNE GLOBULIN (HUMAN) 20 GM/200ML IV SOLN
30.0000 g | Freq: Once | INTRAVENOUS | Status: AC
  Administered 2021-10-02: 30 g via INTRAVENOUS
  Filled 2021-10-02: qty 200

## 2021-10-02 MED ORDER — ACETAMINOPHEN 325 MG PO TABS
650.0000 mg | ORAL_TABLET | Freq: Once | ORAL | Status: AC
  Administered 2021-10-02: 650 mg via ORAL
  Filled 2021-10-02: qty 2

## 2021-10-02 MED ORDER — MAGNESIUM SULFATE 2 GM/50ML IV SOLN: 2.0000 g | Freq: Once | INTRAVENOUS | Status: DC

## 2021-10-02 MED ORDER — SODIUM CHLORIDE 0.9 % IV SOLN: 6.0000 g | Freq: Once | INTRAVENOUS | Status: DC

## 2021-10-02 MED ORDER — HEPARIN SOD (PORK) LOCK FLUSH 100 UNIT/ML IV SOLN
500.0000 [IU] | Freq: Once | INTRAVENOUS | Status: DC | PRN
  Filled 2021-10-02: qty 5

## 2021-10-02 MED ORDER — DEXTROSE 5 % IV SOLN
Freq: Once | INTRAVENOUS | Status: AC
  Filled 2021-10-02: qty 250

## 2021-10-02 NOTE — Patient Instructions (Signed)
[facility]CER CTR AT [place]  Discharge Instructions: ?Thank you for choosing [facility] to provide your oncology and hematology care.  ?If you have a lab appointment with the [facility], please go directly to the [facility] and check in at the registration area. ? ?Wear comfortable clothing and clothing appropriate for easy access to any Portacath or PICC line.  ? ?We strive to give you quality time with your provider. You may need to reschedule your appointment if you arrive late (15 or more minutes).  Arriving late affects you and other patients whose appointments are after yours.  Also, if you miss three or more appointments without notifying the office, you may be dismissed from the clinic at the provider?s discretion.    ?  ?For prescription refill requests, have your pharmacy contact our office and allow 72 hours for refills to be completed.   ? ?Today you received the following chemotherapy and/or immunotherapy agents IVIG    ?  ?To help prevent nausea and vomiting after your treatment, we encourage you to take your nausea medication as directed. ? ?BELOW ARE SYMPTOMS THAT SHOULD BE REPORTED IMMEDIATELY: ?*FEVER GREATER THAN 100.4 F (38 ?C) OR HIGHER ?*CHILLS OR SWEATING ?*NAUSEA AND VOMITING THAT IS NOT CONTROLLED WITH YOUR NAUSEA MEDICATION ?*UNUSUAL SHORTNESS OF BREATH ?*UNUSUAL BRUISING OR BLEEDING ?*URINARY PROBLEMS (pain or burning when urinating, or frequent urination) ?*BOWEL PROBLEMS (unusual diarrhea, constipation, pain near the anus) ?TENDERNESS IN MOUTH AND THROAT WITH OR WITHOUT PRESENCE OF ULCERS (sore throat, sores in mouth, or a toothache) ?UNUSUAL RASH, SWELLING OR PAIN  ?UNUSUAL VAGINAL DISCHARGE OR ITCHING  ? ?Items with * indicate a potential emergency and should be followed up as soon as possible or go to the Emergency Department if any problems should occur. ? ?Please show the CHEMOTHERAPY ALERT CARD or IMMUNOTHERAPY ALERT CARD at check-in to the  Emergency Department and triage nurse. ? ?Should you have questions after your visit or need to cancel or reschedule your appointment, please contact [facility]CER [place] AT [place]  [phone] and follow the prompts.  Office hours are 8:00 a.m. to 4:30 p.m. Monday - Friday. Please note that voicemails left after 4:00 p.m. may not be returned until the following business day.  We are closed weekends and major holidays. You have access to a nurse at all times for urgent questions. Please call the main number to the clinic [phone] and follow the prompts. ? ?For any non-urgent questions, you may also contact your provider using MyChart. We now offer e-Visits for anyone 4 and older to request care online for non-urgent symptoms. For details visit [email or web address]. ?  ?Also download the MyChart app! Go to the app store, search "MyChart", open the app, select [facility], and log in with your MyChart username and password. ? ?Due to Covid, a mask is required upon entering the hospital/clinic. If you do not have a mask, one will be given to you upon arrival. For doctor visits, patients may have 1 support person aged 46 or older with them. For treatment visits, patients cannot have anyone with them due to current Covid guidelines and our immunocompromised population.  ? ?Immune Globulin Injection ?What is this medication? ?IMMUNE GLOBULIN (im MUNE  GLOB yoo lin) helps to prevent or reduce the severity of certain infections in patients who are at risk. This medicine is collected from the pooled blood of many donors. It is used to treat immune system problems, thrombocytopenia, and Kawasaki syndrome. ?This  medicine may be used for other purposes; ask your health care provider or pharmacist if you have questions. ?COMMON BRAND NAME(S): ASCENIV, Baygam, BIVIGAM, Carimune, Carimune NF, cutaquig, Cuvitru, Flebogamma, Flebogamma DIF, GamaSTAN, GamaSTAN S/D, Gamimune N, Gammagard, Gammagard S/D,  Gammaked, Gammaplex, Gammar-P IV, Gamunex, Gamunex-C, Hizentra, Iveegam, Iveegam EN, Octagam, Panglobulin, Panglobulin NF, panzyga, Polygam S/D, Privigen, Sandoglobulin, Venoglobulin-S, Vigam, Vivaglobulin, Xembify ?What should I tell my care team before I take this medication? ?They need to know if you have any of these conditions: ?diabetes ?extremely low or no immune antibodies in the blood ?heart disease ?history of blood clots ?hyperprolinemia ?infection in the blood, sepsis ?kidney disease ?recently received or scheduled to receive a vaccination ?an unusual or allergic reaction to human immune globulin, albumin, maltose, sucrose, other medicines, foods, dyes, or preservatives ?pregnant or trying to get pregnant ?breast-feeding ?How should I use this medication? ?This medicine is for injection into a muscle or infusion into a vein or skin. It is usually given by a health care professional in a hospital or clinic setting. ?In rare cases, some brands of this medicine might be given at home. You will be taught how to give this medicine. Use exactly as directed. Take your medicine at regular intervals. Do not take your medicine more often than directed. ?Talk to your pediatrician regarding the use of this medicine in children. While this drug may be prescribed for selected conditions, precautions do apply. ?Overdosage: If you think you have taken too much of this medicine contact a poison control center or emergency room at once. ?NOTE: This medicine is only for you. Do not share this medicine with others. ?What if I miss a dose? ?It is important not to miss your dose. Call your doctor or health care professional if you are unable to keep an appointment. If you give yourself the medicine and you miss a dose, take it as soon as you can. If it is almost time for your next dose, take only that dose. Do not take double or extra doses. ?What may interact with this medication? ?aspirin and aspirin-like  medicines ?cisplatin ?cyclosporine ?medicines for infection like acyclovir, adefovir, amphotericin B, bacitracin, cidofovir, foscarnet, ganciclovir, gentamicin, pentamidine, vancomycin ?NSAIDS, medicines for pain and inflammation, like ibuprofen or naproxen ?pamidronate ?vaccines ?zoledronic acid ?This list may not describe all possible interactions. Give your health care provider a list of all the medicines, herbs, non-prescription drugs, or dietary supplements you use. Also tell them if you smoke, drink alcohol, or use illegal drugs. Some items may interact with your medicine. ?What should I watch for while using this medication? ?Your condition will be monitored carefully while you are receiving this medicine. ?This medicine is made from pooled blood donations of many different people. It may be possible to pass an infection in this medicine. However, the donors are screened for infections and all products are tested for HIV and hepatitis. The medicine is treated to kill most or all bacteria and viruses. Talk to your doctor about the risks and benefits of this medicine. ?Do not have vaccinations for at least 14 days before, or until at least 3 months after receiving this medicine. ?What side effects may I notice from receiving this medication? ?Side effects that you should report to your doctor or health care professional as soon as possible: ?allergic reactions like skin rash, itching or hives, swelling of the face, lips, or tongue ?blue colored lips or skin ?breathing problems ?chest pain or tightness ?fever ?signs and symptoms of aseptic meningitis  such as stiff neck; sensitivity to light; headache; drowsiness; fever; nausea; vomiting; rash ?signs and symptoms of a blood clot such as chest pain; shortness of breath; pain, swelling, or warmth in the leg ?signs and symptoms of hemolytic anemia such as fast heartbeat; tiredness; dark yellow or brown urine; or yellowing of the eyes or skin ?signs and symptoms of  kidney injury like trouble passing urine or change in the amount of urine ?sudden weight gain ?swelling of the ankles, feet, hands ?Side effects that usually do not require medical attention (report to your doctor or health care

## 2021-10-03 ENCOUNTER — Inpatient Hospital Stay: Payer: Medicare HMO

## 2021-10-03 LAB — MULTIPLE MYELOMA PANEL, SERUM
Albumin SerPl Elph-Mcnc: 3.7 g/dL (ref 2.9–4.4)
Albumin/Glob SerPl: 1.5 (ref 0.7–1.7)
Alpha 1: 0.3 g/dL (ref 0.0–0.4)
Alpha2 Glob SerPl Elph-Mcnc: 0.9 g/dL (ref 0.4–1.0)
B-Globulin SerPl Elph-Mcnc: 0.9 g/dL (ref 0.7–1.3)
Gamma Glob SerPl Elph-Mcnc: 0.4 g/dL (ref 0.4–1.8)
Globulin, Total: 2.6 g/dL (ref 2.2–3.9)
IgA: 15 mg/dL — ABNORMAL LOW (ref 87–352)
IgG (Immunoglobin G), Serum: 358 mg/dL — ABNORMAL LOW (ref 586–1602)
IgM (Immunoglobulin M), Srm: 9 mg/dL — ABNORMAL LOW (ref 26–217)
Total Protein ELP: 6.3 g/dL (ref 6.0–8.5)

## 2021-10-04 ENCOUNTER — Ambulatory Visit: Payer: Medicare HMO | Admitting: Oncology

## 2021-10-04 ENCOUNTER — Other Ambulatory Visit: Payer: Medicare HMO

## 2021-10-04 ENCOUNTER — Ambulatory Visit: Payer: Medicare HMO

## 2021-10-05 DIAGNOSIS — D849 Immunodeficiency, unspecified: Secondary | ICD-10-CM | POA: Diagnosis not present

## 2021-10-05 DIAGNOSIS — B999 Unspecified infectious disease: Secondary | ICD-10-CM | POA: Diagnosis not present

## 2021-10-05 DIAGNOSIS — D649 Anemia, unspecified: Secondary | ICD-10-CM | POA: Diagnosis not present

## 2021-10-05 DIAGNOSIS — C9000 Multiple myeloma not having achieved remission: Secondary | ICD-10-CM | POA: Diagnosis not present

## 2021-10-05 DIAGNOSIS — N289 Disorder of kidney and ureter, unspecified: Secondary | ICD-10-CM | POA: Diagnosis not present

## 2021-10-05 DIAGNOSIS — D696 Thrombocytopenia, unspecified: Secondary | ICD-10-CM | POA: Diagnosis not present

## 2021-10-08 ENCOUNTER — Inpatient Hospital Stay: Payer: Medicare HMO

## 2021-10-08 VITALS — BP 137/62 | HR 85 | Temp 96.9°F | Resp 16

## 2021-10-08 DIAGNOSIS — R771 Abnormality of globulin: Secondary | ICD-10-CM | POA: Diagnosis not present

## 2021-10-08 DIAGNOSIS — K529 Noninfective gastroenteritis and colitis, unspecified: Secondary | ICD-10-CM | POA: Diagnosis not present

## 2021-10-08 DIAGNOSIS — N1831 Chronic kidney disease, stage 3a: Secondary | ICD-10-CM | POA: Diagnosis not present

## 2021-10-08 DIAGNOSIS — D801 Nonfamilial hypogammaglobulinemia: Secondary | ICD-10-CM | POA: Diagnosis not present

## 2021-10-08 DIAGNOSIS — C9001 Multiple myeloma in remission: Secondary | ICD-10-CM | POA: Diagnosis not present

## 2021-10-08 DIAGNOSIS — C9000 Multiple myeloma not having achieved remission: Secondary | ICD-10-CM

## 2021-10-08 DIAGNOSIS — R112 Nausea with vomiting, unspecified: Secondary | ICD-10-CM | POA: Diagnosis not present

## 2021-10-08 DIAGNOSIS — D709 Neutropenia, unspecified: Secondary | ICD-10-CM | POA: Diagnosis not present

## 2021-10-08 DIAGNOSIS — I10 Essential (primary) hypertension: Secondary | ICD-10-CM | POA: Diagnosis not present

## 2021-10-08 DIAGNOSIS — Z5112 Encounter for antineoplastic immunotherapy: Secondary | ICD-10-CM | POA: Diagnosis not present

## 2021-10-08 LAB — MAGNESIUM: Magnesium: 1 mg/dL — ABNORMAL LOW (ref 1.7–2.4)

## 2021-10-08 MED ORDER — SODIUM CHLORIDE 0.9 % IV SOLN: 6.0000 g | Freq: Once | INTRAVENOUS | Status: DC

## 2021-10-08 MED ORDER — SODIUM CHLORIDE 0.9% FLUSH
10.0000 mL | Freq: Once | INTRAVENOUS | Status: AC | PRN
  Administered 2021-10-08: 10 mL
  Filled 2021-10-08: qty 10

## 2021-10-08 MED ORDER — SODIUM CHLORIDE 0.9 % IV SOLN: Freq: Once | INTRAVENOUS | Status: DC

## 2021-10-08 MED ORDER — HEPARIN SOD (PORK) LOCK FLUSH 100 UNIT/ML IV SOLN
500.0000 [IU] | Freq: Once | INTRAVENOUS | Status: AC | PRN
  Administered 2021-10-08: 500 [IU]
  Filled 2021-10-08: qty 5

## 2021-10-08 MED ORDER — MAGNESIUM SULFATE 4 GM/100ML IV SOLN
4.0000 g | Freq: Once | INTRAVENOUS | Status: AC
  Administered 2021-10-08: 4 g via INTRAVENOUS

## 2021-10-08 MED ORDER — SODIUM CHLORIDE 0.9 % IV SOLN
25.0000 mg | Freq: Once | INTRAVENOUS | Status: AC
  Administered 2021-10-08: 25 mg via INTRAVENOUS
  Filled 2021-10-08: qty 1

## 2021-10-08 MED ORDER — MAGNESIUM SULFATE 2 GM/50ML IV SOLN
2.0000 g | Freq: Once | INTRAVENOUS | Status: AC
  Administered 2021-10-08: 2 g via INTRAVENOUS

## 2021-10-08 NOTE — Patient Instructions (Signed)
Magnesium Sulfate Injection What is this medication? MAGNESIUM SULFATE (mag NEE zee um SUL fate) prevents and treats low levels of magnesium in your body. It may also be used to prevent and treat seizures during pregnancy in people with high blood pressure disorders, such as preeclampsia or eclampsia. Magnesium plays an important role in maintaining the health of your muscles and nervous system. This medicine may be used for other purposes; ask your health care provider or pharmacist if you have questions. What should I tell my care team before I take this medication? They need to know if you have any of these conditions: Heart disease History of irregular heart beat Kidney disease An unusual or allergic reaction to magnesium sulfate, medications, foods, dyes, or preservatives Pregnant or trying to get pregnant Breast-feeding How should I use this medication? This medication is for infusion into a vein. It is given in a hospital or clinic setting. Talk to your care team about the use of this medication in children. While this medication may be prescribed for selected conditions, precautions do apply. Overdosage: If you think you have taken too much of this medicine contact a poison control center or emergency room at once. NOTE: This medicine is only for you. Do not share this medicine with others. What if I miss a dose? This does not apply. What may interact with this medication? Certain medications for anxiety or sleep Certain medications for seizures like phenobarbital Digoxin Medications that relax muscles for surgery Narcotic medications for pain This list may not describe all possible interactions. Give your health care provider a list of all the medicines, herbs, non-prescription drugs, or dietary supplements you use. Also tell them if you smoke, drink alcohol, or use illegal drugs. Some items may interact with your medicine. What should I watch for while using this medication? Your  condition will be monitored carefully while you are receiving this medication. You may need blood work done while you are receiving this medication. What side effects may I notice from receiving this medication? Side effects that you should report to your care team as soon as possible: Allergic reactions--skin rash, itching, hives, swelling of the face, lips, tongue, or throat High magnesium level--confusion, drowsiness, facial flushing, redness, sweating, muscle weakness, fast or irregular heartbeat, trouble breathing Low blood pressure--dizziness, feeling faint or lightheaded, blurry vision Side effects that usually do not require medical attention (report to your care team if they continue or are bothersome): Headache Nausea This list may not describe all possible side effects. Call your doctor for medical advice about side effects. You may report side effects to FDA at 1-800-FDA-1088. Where should I keep my medication? This medication is given in a hospital or clinic and will not be stored at home. NOTE: This sheet is a summary. It may not cover all possible information. If you have questions about this medicine, talk to your doctor, pharmacist, or health care provider.  2022 Elsevier/Gold Standard (2020-08-31 00:00:00)  

## 2021-10-15 ENCOUNTER — Inpatient Hospital Stay: Payer: Medicare HMO

## 2021-10-15 VITALS — BP 134/81 | HR 82 | Temp 97.0°F | Resp 17

## 2021-10-15 DIAGNOSIS — R112 Nausea with vomiting, unspecified: Secondary | ICD-10-CM | POA: Diagnosis not present

## 2021-10-15 DIAGNOSIS — N1831 Chronic kidney disease, stage 3a: Secondary | ICD-10-CM | POA: Diagnosis not present

## 2021-10-15 DIAGNOSIS — C9000 Multiple myeloma not having achieved remission: Secondary | ICD-10-CM

## 2021-10-15 DIAGNOSIS — D709 Neutropenia, unspecified: Secondary | ICD-10-CM | POA: Diagnosis not present

## 2021-10-15 DIAGNOSIS — Z5112 Encounter for antineoplastic immunotherapy: Secondary | ICD-10-CM | POA: Diagnosis not present

## 2021-10-15 DIAGNOSIS — C9001 Multiple myeloma in remission: Secondary | ICD-10-CM | POA: Diagnosis not present

## 2021-10-15 DIAGNOSIS — K529 Noninfective gastroenteritis and colitis, unspecified: Secondary | ICD-10-CM | POA: Diagnosis not present

## 2021-10-15 DIAGNOSIS — R771 Abnormality of globulin: Secondary | ICD-10-CM | POA: Diagnosis not present

## 2021-10-15 DIAGNOSIS — I10 Essential (primary) hypertension: Secondary | ICD-10-CM | POA: Diagnosis not present

## 2021-10-15 DIAGNOSIS — D801 Nonfamilial hypogammaglobulinemia: Secondary | ICD-10-CM | POA: Diagnosis not present

## 2021-10-15 LAB — CBC WITH DIFFERENTIAL/PLATELET
Abs Immature Granulocytes: 0.03 10*3/uL (ref 0.00–0.07)
Basophils Absolute: 0 10*3/uL (ref 0.0–0.1)
Basophils Relative: 1 %
Eosinophils Absolute: 0.1 10*3/uL (ref 0.0–0.5)
Eosinophils Relative: 1 %
HCT: 35.1 % — ABNORMAL LOW (ref 36.0–46.0)
Hemoglobin: 11.5 g/dL — ABNORMAL LOW (ref 12.0–15.0)
Immature Granulocytes: 1 %
Lymphocytes Relative: 20 %
Lymphs Abs: 1.3 10*3/uL (ref 0.7–4.0)
MCH: 26.8 pg (ref 26.0–34.0)
MCHC: 32.8 g/dL (ref 30.0–36.0)
MCV: 81.8 fL (ref 80.0–100.0)
Monocytes Absolute: 0.5 10*3/uL (ref 0.1–1.0)
Monocytes Relative: 8 %
Neutro Abs: 4.6 10*3/uL (ref 1.7–7.7)
Neutrophils Relative %: 69 %
Platelets: 119 10*3/uL — ABNORMAL LOW (ref 150–400)
RBC: 4.29 MIL/uL (ref 3.87–5.11)
RDW: 13.9 % (ref 11.5–15.5)
WBC: 6.5 10*3/uL (ref 4.0–10.5)
nRBC: 0 % (ref 0.0–0.2)

## 2021-10-15 LAB — COMPREHENSIVE METABOLIC PANEL
ALT: 54 U/L — ABNORMAL HIGH (ref 0–44)
AST: 46 U/L — ABNORMAL HIGH (ref 15–41)
Albumin: 4.1 g/dL (ref 3.5–5.0)
Alkaline Phosphatase: 103 U/L (ref 38–126)
Anion gap: 9 (ref 5–15)
BUN: 22 mg/dL (ref 8–23)
CO2: 24 mmol/L (ref 22–32)
Calcium: 9.2 mg/dL (ref 8.9–10.3)
Chloride: 104 mmol/L (ref 98–111)
Creatinine, Ser: 1.12 mg/dL — ABNORMAL HIGH (ref 0.44–1.00)
GFR, Estimated: 55 mL/min — ABNORMAL LOW (ref >60)
Glucose, Bld: 168 mg/dL — ABNORMAL HIGH (ref 70–99)
Potassium: 4.1 mmol/L (ref 3.5–5.1)
Sodium: 137 mmol/L (ref 135–145)
Total Bilirubin: 0.4 mg/dL (ref 0.3–1.2)
Total Protein: 7.2 g/dL (ref 6.5–8.1)

## 2021-10-15 LAB — MAGNESIUM: Magnesium: 1.3 mg/dL — ABNORMAL LOW (ref 1.7–2.4)

## 2021-10-15 MED ORDER — SODIUM CHLORIDE 0.9 % IV SOLN: 6.0000 g | Freq: Once | INTRAVENOUS | Status: DC

## 2021-10-15 MED ORDER — METHYLPREDNISOLONE SODIUM SUCC 125 MG IJ SOLR: 125.0000 mg | Freq: Once | INTRAMUSCULAR | Status: DC | PRN

## 2021-10-15 MED ORDER — DIPHENHYDRAMINE HCL 50 MG/ML IJ SOLN: 50.0000 mg | Freq: Once | INTRAMUSCULAR | Status: DC | PRN

## 2021-10-15 MED ORDER — SODIUM CHLORIDE 0.9% FLUSH
10.0000 mL | Freq: Once | INTRAVENOUS | Status: AC | PRN
  Administered 2021-10-15: 10 mL
  Filled 2021-10-15: qty 10

## 2021-10-15 MED ORDER — MAGNESIUM SULFATE 2 GM/50ML IV SOLN
2.0000 g | Freq: Once | INTRAVENOUS | Status: AC
  Administered 2021-10-15: 2 g via INTRAVENOUS
  Filled 2021-10-15: qty 50

## 2021-10-15 MED ORDER — MAGNESIUM SULFATE 4 GM/100ML IV SOLN
4.0000 g | Freq: Once | INTRAVENOUS | Status: AC
  Administered 2021-10-15: 4 g via INTRAVENOUS
  Filled 2021-10-15: qty 100

## 2021-10-15 MED ORDER — SODIUM CHLORIDE 0.9 % IV SOLN
Freq: Once | INTRAVENOUS | Status: DC | PRN
  Filled 2021-10-15: qty 250

## 2021-10-15 MED ORDER — HEPARIN SOD (PORK) LOCK FLUSH 100 UNIT/ML IV SOLN
250.0000 [IU] | Freq: Once | INTRAVENOUS | Status: DC | PRN
  Filled 2021-10-15: qty 5

## 2021-10-15 MED ORDER — ALTEPLASE 2 MG IJ SOLR
2.0000 mg | Freq: Once | INTRAMUSCULAR | Status: DC | PRN
  Filled 2021-10-15: qty 2

## 2021-10-15 MED ORDER — SODIUM CHLORIDE 0.9 % IV SOLN
25.0000 mg | Freq: Once | INTRAVENOUS | Status: AC
  Administered 2021-10-15: 25 mg via INTRAVENOUS
  Filled 2021-10-15: qty 1

## 2021-10-15 MED ORDER — FAMOTIDINE IN NACL 20-0.9 MG/50ML-% IV SOLN: 20.0000 mg | Freq: Once | INTRAVENOUS | Status: DC | PRN

## 2021-10-15 MED ORDER — EPINEPHRINE 0.3 MG/0.3ML IJ SOAJ: 0.3000 mg | Freq: Once | INTRAMUSCULAR | Status: DC | PRN

## 2021-10-15 MED ORDER — SODIUM CHLORIDE 0.9 % IV SOLN: Freq: Once | INTRAVENOUS | Status: DC

## 2021-10-15 MED ORDER — HEPARIN SOD (PORK) LOCK FLUSH 100 UNIT/ML IV SOLN
500.0000 [IU] | Freq: Once | INTRAVENOUS | Status: AC | PRN
  Administered 2021-10-15: 500 [IU]
  Filled 2021-10-15: qty 5

## 2021-10-15 MED ORDER — SODIUM CHLORIDE 0.9% FLUSH
3.0000 mL | Freq: Once | INTRAVENOUS | Status: DC | PRN
  Filled 2021-10-15: qty 3

## 2021-10-15 MED ORDER — SODIUM CHLORIDE 0.9% FLUSH
10.0000 mL | Freq: Once | INTRAVENOUS | Status: DC | PRN
  Filled 2021-10-15: qty 10

## 2021-10-15 MED ORDER — ALBUTEROL SULFATE (2.5 MG/3ML) 0.083% IN NEBU
2.5000 mg | INHALATION_SOLUTION | Freq: Once | RESPIRATORY_TRACT | Status: DC | PRN
  Filled 2021-10-15: qty 3

## 2021-10-15 MED ORDER — SODIUM CHLORIDE 0.9 % IV SOLN
Freq: Once | INTRAVENOUS | Status: AC
  Filled 2021-10-15: qty 250

## 2021-10-15 NOTE — Patient Instructions (Signed)
Magnesium Sulfate Injection What is this medication? MAGNESIUM SULFATE (mag NEE zee um SUL fate) prevents and treats low levels of magnesium in your body. It may also be used to prevent and treat seizures during pregnancy in people with high blood pressure disorders, such as preeclampsia or eclampsia. Magnesium plays an important role in maintaining the health of your muscles and nervous system. This medicine may be used for other purposes; ask your health care provider or pharmacist if you have questions. What should I tell my care team before I take this medication? They need to know if you have any of these conditions: Heart disease History of irregular heart beat Kidney disease An unusual or allergic reaction to magnesium sulfate, medications, foods, dyes, or preservatives Pregnant or trying to get pregnant Breast-feeding How should I use this medication? This medication is for infusion into a vein. It is given in a hospital or clinic setting. Talk to your care team about the use of this medication in children. While this medication may be prescribed for selected conditions, precautions do apply. Overdosage: If you think you have taken too much of this medicine contact a poison control center or emergency room at once. NOTE: This medicine is only for you. Do not share this medicine with others. What if I miss a dose? This does not apply. What may interact with this medication? Certain medications for anxiety or sleep Certain medications for seizures like phenobarbital Digoxin Medications that relax muscles for surgery Narcotic medications for pain This list may not describe all possible interactions. Give your health care provider a list of all the medicines, herbs, non-prescription drugs, or dietary supplements you use. Also tell them if you smoke, drink alcohol, or use illegal drugs. Some items may interact with your medicine. What should I watch for while using this medication? Your  condition will be monitored carefully while you are receiving this medication. You may need blood work done while you are receiving this medication. What side effects may I notice from receiving this medication? Side effects that you should report to your care team as soon as possible: Allergic reactions--skin rash, itching, hives, swelling of the face, lips, tongue, or throat High magnesium level--confusion, drowsiness, facial flushing, redness, sweating, muscle weakness, fast or irregular heartbeat, trouble breathing Low blood pressure--dizziness, feeling faint or lightheaded, blurry vision Side effects that usually do not require medical attention (report to your care team if they continue or are bothersome): Headache Nausea This list may not describe all possible side effects. Call your doctor for medical advice about side effects. You may report side effects to FDA at 1-800-FDA-1088. Where should I keep my medication? This medication is given in a hospital or clinic and will not be stored at home. NOTE: This sheet is a summary. It may not cover all possible information. If you have questions about this medicine, talk to your doctor, pharmacist, or health care provider.  2023 Elsevier/Gold Standard (2020-08-31 00:00:00)  

## 2021-10-15 NOTE — Progress Notes (Signed)
Patient Mg 1.3 today, per order administered 6 g IV Mg. Patient tolerated infusion well, no concerns voiced. Patient stable at discharge. AVS given.   ?

## 2021-10-17 DIAGNOSIS — Z862 Personal history of diseases of the blood and blood-forming organs and certain disorders involving the immune mechanism: Secondary | ICD-10-CM | POA: Diagnosis not present

## 2021-10-17 DIAGNOSIS — M47816 Spondylosis without myelopathy or radiculopathy, lumbar region: Secondary | ICD-10-CM | POA: Diagnosis not present

## 2021-10-22 ENCOUNTER — Inpatient Hospital Stay: Payer: Medicare HMO

## 2021-10-22 DIAGNOSIS — I10 Essential (primary) hypertension: Secondary | ICD-10-CM | POA: Diagnosis not present

## 2021-10-22 DIAGNOSIS — C9001 Multiple myeloma in remission: Secondary | ICD-10-CM | POA: Diagnosis not present

## 2021-10-22 DIAGNOSIS — R771 Abnormality of globulin: Secondary | ICD-10-CM | POA: Diagnosis not present

## 2021-10-22 DIAGNOSIS — R112 Nausea with vomiting, unspecified: Secondary | ICD-10-CM | POA: Diagnosis not present

## 2021-10-22 DIAGNOSIS — N1831 Chronic kidney disease, stage 3a: Secondary | ICD-10-CM | POA: Diagnosis not present

## 2021-10-22 DIAGNOSIS — K529 Noninfective gastroenteritis and colitis, unspecified: Secondary | ICD-10-CM | POA: Diagnosis not present

## 2021-10-22 DIAGNOSIS — D801 Nonfamilial hypogammaglobulinemia: Secondary | ICD-10-CM | POA: Diagnosis not present

## 2021-10-22 DIAGNOSIS — C9000 Multiple myeloma not having achieved remission: Secondary | ICD-10-CM

## 2021-10-22 DIAGNOSIS — D709 Neutropenia, unspecified: Secondary | ICD-10-CM | POA: Diagnosis not present

## 2021-10-22 DIAGNOSIS — Z5112 Encounter for antineoplastic immunotherapy: Secondary | ICD-10-CM | POA: Diagnosis not present

## 2021-10-22 LAB — MAGNESIUM: Magnesium: 1.3 mg/dL — ABNORMAL LOW (ref 1.7–2.4)

## 2021-10-22 MED ORDER — MAGNESIUM SULFATE 2 GM/50ML IV SOLN
2.0000 g | Freq: Once | INTRAVENOUS | Status: AC
  Administered 2021-10-22: 2 g via INTRAVENOUS
  Filled 2021-10-22: qty 50

## 2021-10-22 MED ORDER — SODIUM CHLORIDE 0.9 % IV SOLN: 6.0000 g | Freq: Once | INTRAVENOUS | Status: DC

## 2021-10-22 MED ORDER — MAGNESIUM SULFATE 4 GM/100ML IV SOLN
4.0000 g | Freq: Once | INTRAVENOUS | Status: AC
  Administered 2021-10-22: 4 g via INTRAVENOUS
  Filled 2021-10-22: qty 100

## 2021-10-22 MED ORDER — SODIUM CHLORIDE 0.9 % IV SOLN
25.0000 mg | Freq: Once | INTRAVENOUS | Status: AC
  Administered 2021-10-22: 25 mg via INTRAVENOUS
  Filled 2021-10-22: qty 1

## 2021-10-22 MED ORDER — HEPARIN SOD (PORK) LOCK FLUSH 100 UNIT/ML IV SOLN
500.0000 [IU] | Freq: Once | INTRAVENOUS | Status: AC
  Administered 2021-10-22: 500 [IU] via INTRAVENOUS
  Filled 2021-10-22: qty 5

## 2021-10-22 MED ORDER — SODIUM CHLORIDE 0.9% FLUSH
10.0000 mL | Freq: Once | INTRAVENOUS | Status: AC
  Administered 2021-10-22: 10 mL via INTRAVENOUS
  Filled 2021-10-22: qty 10

## 2021-10-22 MED ORDER — SODIUM CHLORIDE 0.9 % IV SOLN
Freq: Once | INTRAVENOUS | Status: AC
  Filled 2021-10-22: qty 250

## 2021-10-22 NOTE — Progress Notes (Signed)
Magnesium 1.3. Pt receiving 6 grams of Magnesium via port access. Discharged to home at completion. ?

## 2021-10-22 NOTE — Patient Instructions (Signed)
Magnesium Sulfate Injection What is this medication? MAGNESIUM SULFATE (mag NEE zee um SUL fate) prevents and treats low levels of magnesium in your body. It may also be used to prevent and treat seizures during pregnancy in people with high blood pressure disorders, such as preeclampsia or eclampsia. Magnesium plays an important role in maintaining the health of your muscles and nervous system. This medicine may be used for other purposes; ask your health care provider or pharmacist if you have questions. What should I tell my care team before I take this medication? They need to know if you have any of these conditions: Heart disease History of irregular heart beat Kidney disease An unusual or allergic reaction to magnesium sulfate, medications, foods, dyes, or preservatives Pregnant or trying to get pregnant Breast-feeding How should I use this medication? This medication is for infusion into a vein. It is given in a hospital or clinic setting. Talk to your care team about the use of this medication in children. While this medication may be prescribed for selected conditions, precautions do apply. Overdosage: If you think you have taken too much of this medicine contact a poison control center or emergency room at once. NOTE: This medicine is only for you. Do not share this medicine with others. What if I miss a dose? This does not apply. What may interact with this medication? Certain medications for anxiety or sleep Certain medications for seizures like phenobarbital Digoxin Medications that relax muscles for surgery Narcotic medications for pain This list may not describe all possible interactions. Give your health care provider a list of all the medicines, herbs, non-prescription drugs, or dietary supplements you use. Also tell them if you smoke, drink alcohol, or use illegal drugs. Some items may interact with your medicine. What should I watch for while using this medication? Your  condition will be monitored carefully while you are receiving this medication. You may need blood work done while you are receiving this medication. What side effects may I notice from receiving this medication? Side effects that you should report to your care team as soon as possible: Allergic reactions--skin rash, itching, hives, swelling of the face, lips, tongue, or throat High magnesium level--confusion, drowsiness, facial flushing, redness, sweating, muscle weakness, fast or irregular heartbeat, trouble breathing Low blood pressure--dizziness, feeling faint or lightheaded, blurry vision Side effects that usually do not require medical attention (report to your care team if they continue or are bothersome): Headache Nausea This list may not describe all possible side effects. Call your doctor for medical advice about side effects. You may report side effects to FDA at 1-800-FDA-1088. Where should I keep my medication? This medication is given in a hospital or clinic and will not be stored at home. NOTE: This sheet is a summary. It may not cover all possible information. If you have questions about this medicine, talk to your doctor, pharmacist, or health care provider.  2023 Elsevier/Gold Standard (2020-08-31 00:00:00)  

## 2021-10-24 DIAGNOSIS — I350 Nonrheumatic aortic (valve) stenosis: Secondary | ICD-10-CM | POA: Diagnosis not present

## 2021-10-24 DIAGNOSIS — R5383 Other fatigue: Secondary | ICD-10-CM | POA: Diagnosis not present

## 2021-10-29 ENCOUNTER — Encounter: Payer: Self-pay | Admitting: Oncology

## 2021-10-29 ENCOUNTER — Inpatient Hospital Stay (HOSPITAL_BASED_OUTPATIENT_CLINIC_OR_DEPARTMENT_OTHER): Payer: Medicare HMO | Admitting: Oncology

## 2021-10-29 ENCOUNTER — Inpatient Hospital Stay: Payer: Medicare HMO | Attending: Oncology

## 2021-10-29 ENCOUNTER — Inpatient Hospital Stay: Payer: Medicare HMO

## 2021-10-29 VITALS — BP 124/67 | HR 66 | Temp 97.4°F | Resp 18 | Wt 155.9 lb

## 2021-10-29 DIAGNOSIS — Z8249 Family history of ischemic heart disease and other diseases of the circulatory system: Secondary | ICD-10-CM | POA: Diagnosis not present

## 2021-10-29 DIAGNOSIS — Z801 Family history of malignant neoplasm of trachea, bronchus and lung: Secondary | ICD-10-CM | POA: Insufficient documentation

## 2021-10-29 DIAGNOSIS — N1831 Chronic kidney disease, stage 3a: Secondary | ICD-10-CM | POA: Diagnosis not present

## 2021-10-29 DIAGNOSIS — R197 Diarrhea, unspecified: Secondary | ICD-10-CM | POA: Diagnosis not present

## 2021-10-29 DIAGNOSIS — Z79899 Other long term (current) drug therapy: Secondary | ICD-10-CM | POA: Insufficient documentation

## 2021-10-29 DIAGNOSIS — K219 Gastro-esophageal reflux disease without esophagitis: Secondary | ICD-10-CM | POA: Diagnosis not present

## 2021-10-29 DIAGNOSIS — C9001 Multiple myeloma in remission: Secondary | ICD-10-CM

## 2021-10-29 DIAGNOSIS — M2550 Pain in unspecified joint: Secondary | ICD-10-CM | POA: Diagnosis not present

## 2021-10-29 DIAGNOSIS — D631 Anemia in chronic kidney disease: Secondary | ICD-10-CM | POA: Insufficient documentation

## 2021-10-29 DIAGNOSIS — D801 Nonfamilial hypogammaglobulinemia: Secondary | ICD-10-CM | POA: Insufficient documentation

## 2021-10-29 DIAGNOSIS — Z800 Family history of malignant neoplasm of digestive organs: Secondary | ICD-10-CM | POA: Insufficient documentation

## 2021-10-29 DIAGNOSIS — Z9484 Stem cells transplant status: Secondary | ICD-10-CM

## 2021-10-29 DIAGNOSIS — E119 Type 2 diabetes mellitus without complications: Secondary | ICD-10-CM | POA: Diagnosis not present

## 2021-10-29 DIAGNOSIS — G620 Drug-induced polyneuropathy: Secondary | ICD-10-CM

## 2021-10-29 DIAGNOSIS — C9000 Multiple myeloma not having achieved remission: Secondary | ICD-10-CM

## 2021-10-29 DIAGNOSIS — T451X5A Adverse effect of antineoplastic and immunosuppressive drugs, initial encounter: Secondary | ICD-10-CM | POA: Insufficient documentation

## 2021-10-29 DIAGNOSIS — Z803 Family history of malignant neoplasm of breast: Secondary | ICD-10-CM | POA: Diagnosis not present

## 2021-10-29 DIAGNOSIS — R110 Nausea: Secondary | ICD-10-CM

## 2021-10-29 DIAGNOSIS — Z5111 Encounter for antineoplastic chemotherapy: Secondary | ICD-10-CM

## 2021-10-29 DIAGNOSIS — Z833 Family history of diabetes mellitus: Secondary | ICD-10-CM | POA: Diagnosis not present

## 2021-10-29 DIAGNOSIS — Z452 Encounter for adjustment and management of vascular access device: Secondary | ICD-10-CM | POA: Diagnosis not present

## 2021-10-29 DIAGNOSIS — M549 Dorsalgia, unspecified: Secondary | ICD-10-CM | POA: Insufficient documentation

## 2021-10-29 DIAGNOSIS — E538 Deficiency of other specified B group vitamins: Secondary | ICD-10-CM | POA: Diagnosis not present

## 2021-10-29 DIAGNOSIS — I129 Hypertensive chronic kidney disease with stage 1 through stage 4 chronic kidney disease, or unspecified chronic kidney disease: Secondary | ICD-10-CM | POA: Insufficient documentation

## 2021-10-29 DIAGNOSIS — D649 Anemia, unspecified: Secondary | ICD-10-CM

## 2021-10-29 DIAGNOSIS — Z9221 Personal history of antineoplastic chemotherapy: Secondary | ICD-10-CM | POA: Diagnosis not present

## 2021-10-29 DIAGNOSIS — Z841 Family history of disorders of kidney and ureter: Secondary | ICD-10-CM | POA: Diagnosis not present

## 2021-10-29 DIAGNOSIS — Z808 Family history of malignant neoplasm of other organs or systems: Secondary | ICD-10-CM | POA: Diagnosis not present

## 2021-10-29 DIAGNOSIS — Z8379 Family history of other diseases of the digestive system: Secondary | ICD-10-CM | POA: Insufficient documentation

## 2021-10-29 DIAGNOSIS — Z822 Family history of deafness and hearing loss: Secondary | ICD-10-CM | POA: Insufficient documentation

## 2021-10-29 LAB — CBC WITH DIFFERENTIAL/PLATELET
Abs Immature Granulocytes: 0.02 10*3/uL (ref 0.00–0.07)
Basophils Absolute: 0 10*3/uL (ref 0.0–0.1)
Basophils Relative: 0 %
Eosinophils Absolute: 0.2 10*3/uL (ref 0.0–0.5)
Eosinophils Relative: 3 %
HCT: 33.3 % — ABNORMAL LOW (ref 36.0–46.0)
Hemoglobin: 11 g/dL — ABNORMAL LOW (ref 12.0–15.0)
Immature Granulocytes: 0 %
Lymphocytes Relative: 23 %
Lymphs Abs: 1.3 10*3/uL (ref 0.7–4.0)
MCH: 26.8 pg (ref 26.0–34.0)
MCHC: 33 g/dL (ref 30.0–36.0)
MCV: 81 fL (ref 80.0–100.0)
Monocytes Absolute: 0.4 10*3/uL (ref 0.1–1.0)
Monocytes Relative: 8 %
Neutro Abs: 3.7 10*3/uL (ref 1.7–7.7)
Neutrophils Relative %: 66 %
Platelets: 130 10*3/uL — ABNORMAL LOW (ref 150–400)
RBC: 4.11 MIL/uL (ref 3.87–5.11)
RDW: 13.7 % (ref 11.5–15.5)
WBC: 5.6 10*3/uL (ref 4.0–10.5)
nRBC: 0 % (ref 0.0–0.2)

## 2021-10-29 LAB — COMPREHENSIVE METABOLIC PANEL
ALT: 55 U/L — ABNORMAL HIGH (ref 0–44)
AST: 42 U/L — ABNORMAL HIGH (ref 15–41)
Albumin: 4 g/dL (ref 3.5–5.0)
Alkaline Phosphatase: 87 U/L (ref 38–126)
Anion gap: 10 (ref 5–15)
BUN: 22 mg/dL (ref 8–23)
CO2: 23 mmol/L (ref 22–32)
Calcium: 9.1 mg/dL (ref 8.9–10.3)
Chloride: 103 mmol/L (ref 98–111)
Creatinine, Ser: 1.22 mg/dL — ABNORMAL HIGH (ref 0.44–1.00)
GFR, Estimated: 49 mL/min — ABNORMAL LOW (ref >60)
Glucose, Bld: 157 mg/dL — ABNORMAL HIGH (ref 70–99)
Potassium: 3.9 mmol/L (ref 3.5–5.1)
Sodium: 136 mmol/L (ref 135–145)
Total Bilirubin: 0.6 mg/dL (ref 0.3–1.2)
Total Protein: 6.9 g/dL (ref 6.5–8.1)

## 2021-10-29 LAB — MAGNESIUM: Magnesium: 1.1 mg/dL — ABNORMAL LOW (ref 1.7–2.4)

## 2021-10-29 MED ORDER — MAGNESIUM SULFATE 2 GM/50ML IV SOLN
2.0000 g | Freq: Once | INTRAVENOUS | Status: AC
  Administered 2021-10-29: 2 g via INTRAVENOUS
  Filled 2021-10-29: qty 50

## 2021-10-29 MED ORDER — SODIUM CHLORIDE 0.9 % IV SOLN
Freq: Once | INTRAVENOUS | Status: AC
  Filled 2021-10-29: qty 250

## 2021-10-29 MED ORDER — SODIUM CHLORIDE 0.9% FLUSH
10.0000 mL | INTRAVENOUS | Status: DC | PRN
  Administered 2021-10-29: 10 mL via INTRAVENOUS
  Filled 2021-10-29: qty 10

## 2021-10-29 MED ORDER — MAGNESIUM SULFATE 4 GM/100ML IV SOLN
4.0000 g | Freq: Once | INTRAVENOUS | Status: AC
  Administered 2021-10-29: 4 g via INTRAVENOUS
  Filled 2021-10-29: qty 100

## 2021-10-29 MED ORDER — HEPARIN SOD (PORK) LOCK FLUSH 100 UNIT/ML IV SOLN
500.0000 [IU] | Freq: Once | INTRAVENOUS | Status: AC
  Filled 2021-10-29: qty 5

## 2021-10-29 MED ORDER — SODIUM CHLORIDE 0.9 % IV SOLN: 6.0000 g | Freq: Once | INTRAVENOUS | Status: DC

## 2021-10-29 MED ORDER — HEPARIN SOD (PORK) LOCK FLUSH 100 UNIT/ML IV SOLN
INTRAVENOUS | Status: AC
  Filled 2021-10-29: qty 5

## 2021-10-29 MED ORDER — HEPARIN SOD (PORK) LOCK FLUSH 100 UNIT/ML IV SOLN
500.0000 [IU] | Freq: Once | INTRAVENOUS | Status: AC
  Administered 2021-10-29: 500 [IU] via INTRAVENOUS
  Filled 2021-10-29: qty 5

## 2021-10-29 MED ORDER — SODIUM CHLORIDE 0.9 % IV SOLN
25.0000 mg | Freq: Once | INTRAVENOUS | Status: AC
  Administered 2021-10-29: 25 mg via INTRAVENOUS
  Filled 2021-10-29: qty 1

## 2021-10-29 NOTE — Progress Notes (Signed)
Pt here for follow up. No new concerns voiced.   

## 2021-10-29 NOTE — Progress Notes (Signed)
?Hematology/Oncology Progress note ?[phone] [phone] ?  ? ?  ? ? ?Clinic Day:  10/29/2021  ? ?Referring physician: Glean Hess, MD ? ?Chief Complaint: Sarah [name] is a 66 y.o. 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 ? ?PERTINENT ONCOLOGY HISTORY ?Sarah [name] is a 66 y.o.afemale who has above oncology history reviewed by me today presented for follow up visit for management of multiple myeloma ?Patient previously followed up by Dr.Corcoran, patient switched care to me on 11/23/20 ?Extensive medical record review was performed by me ? 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   Initial bone marrow revealed 80% plasma cells.  Lambda free light chains were 1340.  She had nephrotic range proteinuria.  She initially underwent induction with RVD.  Revlimid maintenance was discontinued on 01/21/2017 secondary to intolerance.   ?  ?07/19/2019 Bone marrow aspirate and biopsy on  revealed a normocellular marrow with but increased lambda-restricted plasma cells (9% aspirate, 40% CD138 immunohistochemistry).  Findings were consistent with recurrent plasma cell myeloma.  Flow cytometry revealed no monoclonal B-cell or phenotypically aberrant T-cell population. Cytogenetics were 35, XX (normal).  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 ?   ?Lambda light chains have been followed: 22.2 (ratio 0.56) on 07/03/2017, 30.8 (ratio 0.78) on 09/02/2017, 36.9 (ratio 0.40) on 10/21/2017, 37.4 (ratio 0.41) on 12/16/2017, 70.7(ratio 0.31)  on 02/17/2018, 64.2 (ratio 0.27) on 04/07/2018, 78.9 (ratio 0.18) on 05/26/2018, 128.8 (ratio 0.17) on 08/06/2018, 181.5 (ratio 0.13) on 10/08/2018, 130.9 (ratio 0.13) on 10/20/2018, 160.7 (ratio  0.10) on 12/09/2018, 236.6 (ratio 0.07) on 02/01/2019, 363.6 (ratio 0.04) on 03/22/2019, 404.8 (ratio 0.04) on 04/05/2019, 420.7 (ratio 0.03) on 05/24/2019, 573.4 (ratio 0.03) on 06/23/2019, 451.05 (ratio 0.02) on 08/20/2019, 47.2 (ratio 0.13) on 10/25/2019, 22.4 (ratio 0.21) on 11/25/2019, 16.5 (ratio 0.33) on 01/10/2020, 14.6 (ratio 0.34) on 02/07/2020, 13.1 (ratio 0.31) on 03/07/2020, 10.1 (ratio 0.38) on 04/10/2020, and 9.5 (ratio 0.21) on 06/19/2020. ?  ?24 hour UPEP on 06/03/2019 revealed kappa free light chains 95.76, lambda free light chains 1,260.71, and ratio 0.08.  24 hour UPEP on 08/23/2019 revealed total protein of 782 mg/24 hrs with lambda free light chains 1,084.16 mg/L and ratio of 0.10 (1.03-31.76).  M spike in urine was 46.1% (361 mg/24 hrs).  ?  ?Bone survey on 04/08/2016 and 05/28/2017 revealed no definite lytic lesion seen in the visualized skeleton.  Bone survey on 11/19/2018 revealed no suspicious lucent lesions and no acute bony abnormality.  PET scan on 07/12/2019 revealed no focal metabolic activity to suggest active myeloma within the skeleton. There were no lytic lesions identified on the CT portion of the exam or soft tissue plasmacytomas. There was no evidence of multiple myeloma.  ?  ?Pretreatment RBC phenotype on 09/23/2019 was positive for C, e, DUFFY B, KIDD B, M, S, and s antigen; negative for c, E, KELL, DUFFY A, KIDD A, and N antigen.  ?  ?09/27/2019 - 10/25/2019; 12/09/2019 - 03/13/2020 6 cycles of daratumumab and hyaluronidase-fihj, Pomalyst, and Decadron (DPd) .  Cycle #1 was complicated by fever and neutropenia requiring admission.  Cycle #2 was complicated by pneumonia requiring admission.  Cycle #6 was complicated with an ER evaluation for an elevated lactic acid. ?  ?05/10/2020 second autologous stem cell transplant at  [facility]   She underwent conditioning with melphalan 140 mg/m2.   ? ?She is s/p week #3 Velcade maintenance (began 08/31/2020 - 09/28/2020).  She has no increase  in neuropathy. ? ?She has severe aortic stenosis.  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 Cardiology is following for a possible TAVR in the future.  Echo on 07/05/2020 revealed moderate to severe aortic stenosis with an EF of 50-55%. ? 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 Zometa was discontinued in 01/2017.  She has chronic nausea on Phenergan.   ?  ?B12 deficiency.  B12 was 254 on 04/09/2017, 295 on 08/20/2018, and 391 on 10/08/2018.  She was on oral B12.  She received B12 monthly (last 09/14/2020).  Folate was 12.1 on 01/10/2020.  ?  ?She has iron deficiency.  Ferritin was 32 on 07/01/2019.  She received Venofer on 07/15/2019 and 07/22/2019. ?  ?She has hypogammaglobulinemia.  IgG was 245 on 11/25/2019.  She received monthly IVIG (07/22/021 - 03/22/2020).  She received IVIG 400 mg/kg on 01/20/2020, 200 mg/kg on 02/17/2020, and 300 mg/kg on 03/22/2020.  IVIG on 82/99/3716 was complicated by acute renal failure. IgG trough level was 418 on 02/17/2020. ? ?10/15/2019 - 10/20/2019 She was admitted to [facility] with fever and neutropenia.  Cultures were negative.  CXR was negative. She received broad spectrum antibiotics and daily Granix.  She received IVF for acute renal insufficiency due to diarrhea and dehydration.  Creatine was 1.66 on admission and 1.12 on discharge. ?  ? 03/01/2020 - 03/05/2020 admitted to [facility] from with fever and neutropenia. CXR revealed no active cardiopulmonary disease. Chest CT with contrast revealed no acute intrathoracic pathology. There were findings which could be suggestive of prior granulomatous disease. She was treated with Cefepime and Vancomycin, then switched to ciprofloxacin on 03/03/2020.  ? ?05/08/2020 - 12/05/2021The patient was admitted to [facility] from  for autologous bone marrow transplant.   She received conditioning with melphalan 140 mg/m2.  Bone marrow transplant was on  05/10/2020. The patient developed fevers daily from 05/19/2020 - 05/24/2020. Blood cultures were + for strept sanguis bacteremia.  She was treated cefepime, vancomycin, and solumedrol for possible engraftment syndrome. Cefepime was switched to ceftriaxone; she completed 2 weeks of ceftriaxone on 06/03/2020. She had chemotherapy induced diarrhea.  She experienced urinary retention. ? ?Feb 2022 patient has been on maintenance Velcade 2 mg every 2 weeks. ? ?Chronic diarrhea ?seen by gastroenterology Dr. Vicente Males and was recommended to avoid artificial sugars in her diet including Diet Coke and coffee mate.  She feels some improvement of her diarrhea frequency since the dietary modification.  ?GI profile PCR negative, C. difficile toxin A+ B negative, fecal calprotectin 77 ?12/19/2020 colonoscopy showed 2 subcentimeter polyps in the ascending colon, resected and removed.  Pathology showed tubular adenoma.  No malignancy.  Normal mucosa in the entire examined colon.  Biopsied, pathology showed colonic mucosa with no significant pathological alteration.  Negative for active inflammation and features of chronicity.  Negative for microscopic colitis, dysplasia, and malignanc ? ?Diabetes, patient has discontinued metformin due to diarrhea and started on glipizide 2.5 mg ? ? ?03/05/2021-03/09/2021 ?Patient was hospitalized due to fever and chills, nonproductive cough, shortness of breath. ?X-ray showed right lower lobe atelectasis versus pneumonia.  Blood cultures negative.  Urine culture showed 60,000 colonies of Enterococcus facialis.  Patient received antibiotics. ?Patient also had abdominal pelvis CT scan for work-up of intermittent  lower abdomen pain.No acute abnormality.  ? ?05/14/21 last dose of Velcade maintenance.  ?establish care with neurology Dr. Gurney Maxin and her neuropathy regimen has been switched from gabapentin to Lyrica. ?05/29/2021 patient got influenza  ? ?neuropathy medication has been switched from  gabapentin/Lyrica to nortriptyline. ? ?08/18/2021 Hospitalized due to pneumonia from metapnuemovirus, hospitalization was complicated with NSTEMI. She received heparin gtt.  ?Treated with antibiotics for coverage of

## 2021-10-30 LAB — KAPPA/LAMBDA LIGHT CHAINS
Kappa free light chain: 3.5 mg/L (ref 3.3–19.4)
Kappa, lambda light chain ratio: 0.97 (ref 0.26–1.65)
Lambda free light chains: 3.6 mg/L — ABNORMAL LOW (ref 5.7–26.3)

## 2021-10-31 DIAGNOSIS — A689 Relapsing fever, unspecified: Secondary | ICD-10-CM | POA: Diagnosis not present

## 2021-10-31 DIAGNOSIS — C9000 Multiple myeloma not having achieved remission: Secondary | ICD-10-CM | POA: Diagnosis not present

## 2021-11-01 LAB — MULTIPLE MYELOMA PANEL, SERUM
Albumin SerPl Elph-Mcnc: 3.9 g/dL (ref 2.9–4.4)
Albumin/Glob SerPl: 1.7 (ref 0.7–1.7)
Alpha 1: 0.2 g/dL (ref 0.0–0.4)
Alpha2 Glob SerPl Elph-Mcnc: 0.8 g/dL (ref 0.4–1.0)
B-Globulin SerPl Elph-Mcnc: 0.8 g/dL (ref 0.7–1.3)
Gamma Glob SerPl Elph-Mcnc: 0.5 g/dL (ref 0.4–1.8)
Globulin, Total: 2.3 g/dL (ref 2.2–3.9)
IgA: 14 mg/dL — ABNORMAL LOW (ref 87–352)
IgG (Immunoglobin G), Serum: 543 mg/dL — ABNORMAL LOW (ref 586–1602)
IgM (Immunoglobulin M), Srm: 5 mg/dL — ABNORMAL LOW (ref 26–217)
Total Protein ELP: 6.2 g/dL (ref 6.0–8.5)

## 2021-11-02 DIAGNOSIS — M47816 Spondylosis without myelopathy or radiculopathy, lumbar region: Secondary | ICD-10-CM | POA: Diagnosis not present

## 2021-11-02 DIAGNOSIS — Z862 Personal history of diseases of the blood and blood-forming organs and certain disorders involving the immune mechanism: Secondary | ICD-10-CM | POA: Diagnosis not present

## 2021-11-05 ENCOUNTER — Inpatient Hospital Stay: Payer: Medicare HMO

## 2021-11-05 ENCOUNTER — Other Ambulatory Visit: Payer: Self-pay | Admitting: Internal Medicine

## 2021-11-05 VITALS — BP 111/85 | HR 74 | Temp 96.7°F | Resp 16

## 2021-11-05 DIAGNOSIS — Z452 Encounter for adjustment and management of vascular access device: Secondary | ICD-10-CM | POA: Diagnosis not present

## 2021-11-05 DIAGNOSIS — C9000 Multiple myeloma not having achieved remission: Secondary | ICD-10-CM

## 2021-11-05 DIAGNOSIS — C9001 Multiple myeloma in remission: Secondary | ICD-10-CM | POA: Diagnosis not present

## 2021-11-05 DIAGNOSIS — F324 Major depressive disorder, single episode, in partial remission: Secondary | ICD-10-CM

## 2021-11-05 DIAGNOSIS — T451X5A Adverse effect of antineoplastic and immunosuppressive drugs, initial encounter: Secondary | ICD-10-CM | POA: Diagnosis not present

## 2021-11-05 DIAGNOSIS — K219 Gastro-esophageal reflux disease without esophagitis: Secondary | ICD-10-CM | POA: Diagnosis not present

## 2021-11-05 DIAGNOSIS — D801 Nonfamilial hypogammaglobulinemia: Secondary | ICD-10-CM | POA: Diagnosis not present

## 2021-11-05 DIAGNOSIS — G620 Drug-induced polyneuropathy: Secondary | ICD-10-CM | POA: Diagnosis not present

## 2021-11-05 DIAGNOSIS — E119 Type 2 diabetes mellitus without complications: Secondary | ICD-10-CM | POA: Diagnosis not present

## 2021-11-05 DIAGNOSIS — E538 Deficiency of other specified B group vitamins: Secondary | ICD-10-CM | POA: Diagnosis not present

## 2021-11-05 LAB — MAGNESIUM: Magnesium: 1 mg/dL — ABNORMAL LOW (ref 1.7–2.4)

## 2021-11-05 MED ORDER — HEPARIN SOD (PORK) LOCK FLUSH 100 UNIT/ML IV SOLN
500.0000 [IU] | Freq: Once | INTRAVENOUS | Status: AC | PRN
  Administered 2021-11-05: 500 [IU]
  Filled 2021-11-05: qty 5

## 2021-11-05 MED ORDER — SODIUM CHLORIDE 0.9 % IV SOLN: 4.0000 g | Freq: Once | INTRAVENOUS | Status: DC

## 2021-11-05 MED ORDER — SODIUM CHLORIDE 0.9% FLUSH
10.0000 mL | Freq: Once | INTRAVENOUS | Status: AC | PRN
  Administered 2021-11-05: 10 mL
  Filled 2021-11-05: qty 10

## 2021-11-05 MED ORDER — MAGNESIUM SULFATE 2 GM/50ML IV SOLN
2.0000 g | Freq: Once | INTRAVENOUS | Status: AC
  Administered 2021-11-05: 2 g via INTRAVENOUS

## 2021-11-05 MED ORDER — SODIUM CHLORIDE 0.9 % IV SOLN
25.0000 mg | Freq: Once | INTRAVENOUS | Status: AC
  Administered 2021-11-05: 25 mg via INTRAVENOUS
  Filled 2021-11-05: qty 1

## 2021-11-05 MED ORDER — SODIUM CHLORIDE 0.9 % IV SOLN
Freq: Once | INTRAVENOUS | Status: DC
  Filled 2021-11-05: qty 250

## 2021-11-05 MED ORDER — MAGNESIUM SULFATE 4 GM/100ML IV SOLN
4.0000 g | Freq: Once | INTRAVENOUS | Status: AC
  Administered 2021-11-05: 4 g via INTRAVENOUS

## 2021-11-06 DIAGNOSIS — I471 Supraventricular tachycardia: Secondary | ICD-10-CM | POA: Diagnosis not present

## 2021-11-06 DIAGNOSIS — Z9484 Stem cells transplant status: Secondary | ICD-10-CM | POA: Diagnosis not present

## 2021-11-06 DIAGNOSIS — R0602 Shortness of breath: Secondary | ICD-10-CM | POA: Diagnosis not present

## 2021-11-06 DIAGNOSIS — I350 Nonrheumatic aortic (valve) stenosis: Secondary | ICD-10-CM | POA: Diagnosis not present

## 2021-11-06 DIAGNOSIS — R5383 Other fatigue: Secondary | ICD-10-CM | POA: Diagnosis not present

## 2021-11-06 DIAGNOSIS — I493 Ventricular premature depolarization: Secondary | ICD-10-CM | POA: Diagnosis not present

## 2021-11-06 DIAGNOSIS — Q231 Congenital insufficiency of aortic valve: Secondary | ICD-10-CM | POA: Diagnosis not present

## 2021-11-06 DIAGNOSIS — C9002 Multiple myeloma in relapse: Secondary | ICD-10-CM | POA: Diagnosis not present

## 2021-11-06 DIAGNOSIS — Z885 Allergy status to narcotic agent status: Secondary | ICD-10-CM | POA: Diagnosis not present

## 2021-11-06 DIAGNOSIS — I2510 Atherosclerotic heart disease of native coronary artery without angina pectoris: Secondary | ICD-10-CM | POA: Diagnosis not present

## 2021-11-06 NOTE — Telephone Encounter (Signed)
Requested Prescriptions  ?Pending Prescriptions Disp Refills  ?? FLUoxetine (PROZAC) 40 MG capsule [Pharmacy Med Name: FLUOXETINE HYDROCHLORIDE 40 MG Capsule] 90 capsule 0  ?  Sig: TAKE 1 CAPSULE EVERY DAY  ?  ? Psychiatry:  Antidepressants - SSRI Passed - 11/05/2021  4:12 PM  ?  ?  Passed - Completed PHQ-2 or PHQ-9 in the last 360 days  ?  ?  Passed - Valid encounter within last 6 months  ?  Recent Outpatient Visits   ?      ? 4 months ago Viral URI with cough  ? [facility] Glean Hess, MD  ? 5 months ago Exposure to influenza  ? [facility] Glean Hess, MD  ? 7 months ago Pneumonia of right lower lobe due to infectious organism  ? [facility] Glean Hess, MD  ? 11 months ago Annual physical exam  ? [facility] Glean Hess, MD  ? 1 year ago Benign essential HTN  ? [facility] Glean Hess, MD  ?  ?  ?Future Appointments   ?        ? In 1 month [employer] Jesse Sans, MD [facility] [place]  ?  ? ?  ?  ?  ? ?

## 2021-11-07 ENCOUNTER — Inpatient Hospital Stay: Payer: Medicare HMO

## 2021-11-07 DIAGNOSIS — I350 Nonrheumatic aortic (valve) stenosis: Secondary | ICD-10-CM | POA: Diagnosis not present

## 2021-11-09 ENCOUNTER — Other Ambulatory Visit: Payer: Self-pay | Admitting: Oncology

## 2021-11-09 ENCOUNTER — Inpatient Hospital Stay: Payer: Medicare HMO

## 2021-11-09 VITALS — BP 152/71 | HR 72 | Temp 96.6°F | Resp 20 | Wt 154.7 lb

## 2021-11-09 DIAGNOSIS — E119 Type 2 diabetes mellitus without complications: Secondary | ICD-10-CM | POA: Diagnosis not present

## 2021-11-09 DIAGNOSIS — K219 Gastro-esophageal reflux disease without esophagitis: Secondary | ICD-10-CM | POA: Diagnosis not present

## 2021-11-09 DIAGNOSIS — C9000 Multiple myeloma not having achieved remission: Secondary | ICD-10-CM

## 2021-11-09 DIAGNOSIS — E538 Deficiency of other specified B group vitamins: Secondary | ICD-10-CM | POA: Diagnosis not present

## 2021-11-09 DIAGNOSIS — T451X5A Adverse effect of antineoplastic and immunosuppressive drugs, initial encounter: Secondary | ICD-10-CM | POA: Diagnosis not present

## 2021-11-09 DIAGNOSIS — Z452 Encounter for adjustment and management of vascular access device: Secondary | ICD-10-CM | POA: Diagnosis not present

## 2021-11-09 DIAGNOSIS — G620 Drug-induced polyneuropathy: Secondary | ICD-10-CM | POA: Diagnosis not present

## 2021-11-09 DIAGNOSIS — D801 Nonfamilial hypogammaglobulinemia: Secondary | ICD-10-CM | POA: Diagnosis not present

## 2021-11-09 DIAGNOSIS — C9001 Multiple myeloma in remission: Secondary | ICD-10-CM | POA: Diagnosis not present

## 2021-11-09 MED ORDER — SODIUM CHLORIDE 0.9 % IV SOLN
Freq: Once | INTRAVENOUS | Status: AC
  Filled 2021-11-09: qty 250

## 2021-11-09 MED ORDER — DEXTROSE 5 % IV SOLN
Freq: Once | INTRAVENOUS | Status: AC
  Filled 2021-11-09: qty 250

## 2021-11-09 MED ORDER — IMMUNE GLOBULIN (HUMAN) 20 GM/200ML IV SOLN
400.0000 mg/kg | Freq: Once | INTRAVENOUS | Status: DC
  Filled 2021-11-09: qty 300

## 2021-11-09 MED ORDER — CYANOCOBALAMIN 1000 MCG/ML IJ SOLN
1000.0000 ug | Freq: Once | INTRAMUSCULAR | Status: AC
  Administered 2021-11-09: 1000 ug via INTRAMUSCULAR
  Filled 2021-11-09: qty 1

## 2021-11-09 MED ORDER — DIPHENHYDRAMINE HCL 25 MG PO CAPS
25.0000 mg | ORAL_CAPSULE | Freq: Four times a day (QID) | ORAL | Status: DC | PRN
  Administered 2021-11-09: 25 mg via ORAL
  Filled 2021-11-09: qty 1

## 2021-11-09 MED ORDER — ACETAMINOPHEN 325 MG PO TABS
650.0000 mg | ORAL_TABLET | Freq: Four times a day (QID) | ORAL | Status: DC | PRN
  Administered 2021-11-09: 650 mg via ORAL
  Filled 2021-11-09: qty 2

## 2021-11-09 MED ORDER — IMMUNE GLOBULIN (HUMAN) 20 GM/200ML IV SOLN
30.0000 g | Freq: Once | INTRAVENOUS | Status: AC
  Administered 2021-11-09: 30 g via INTRAVENOUS
  Filled 2021-11-09: qty 200

## 2021-11-12 ENCOUNTER — Inpatient Hospital Stay: Payer: Medicare HMO

## 2021-11-12 VITALS — BP 124/80 | HR 78 | Temp 97.4°F | Resp 18

## 2021-11-12 DIAGNOSIS — K219 Gastro-esophageal reflux disease without esophagitis: Secondary | ICD-10-CM | POA: Diagnosis not present

## 2021-11-12 DIAGNOSIS — C9000 Multiple myeloma not having achieved remission: Secondary | ICD-10-CM

## 2021-11-12 DIAGNOSIS — C9001 Multiple myeloma in remission: Secondary | ICD-10-CM | POA: Diagnosis not present

## 2021-11-12 DIAGNOSIS — Z452 Encounter for adjustment and management of vascular access device: Secondary | ICD-10-CM | POA: Diagnosis not present

## 2021-11-12 DIAGNOSIS — E538 Deficiency of other specified B group vitamins: Secondary | ICD-10-CM | POA: Diagnosis not present

## 2021-11-12 DIAGNOSIS — G620 Drug-induced polyneuropathy: Secondary | ICD-10-CM | POA: Diagnosis not present

## 2021-11-12 DIAGNOSIS — T451X5A Adverse effect of antineoplastic and immunosuppressive drugs, initial encounter: Secondary | ICD-10-CM | POA: Diagnosis not present

## 2021-11-12 DIAGNOSIS — D801 Nonfamilial hypogammaglobulinemia: Secondary | ICD-10-CM | POA: Diagnosis not present

## 2021-11-12 DIAGNOSIS — E119 Type 2 diabetes mellitus without complications: Secondary | ICD-10-CM | POA: Diagnosis not present

## 2021-11-12 LAB — MAGNESIUM: Magnesium: 1.1 mg/dL — ABNORMAL LOW (ref 1.7–2.4)

## 2021-11-12 MED ORDER — SODIUM CHLORIDE 0.9 % IV SOLN: 6.0000 g | Freq: Once | INTRAVENOUS | Status: DC

## 2021-11-12 MED ORDER — SODIUM CHLORIDE 0.9 % IV SOLN
Freq: Once | INTRAVENOUS | Status: AC
  Filled 2021-11-12: qty 250

## 2021-11-12 MED ORDER — MAGNESIUM SULFATE 4 GM/100ML IV SOLN
4.0000 g | Freq: Once | INTRAVENOUS | Status: AC
  Administered 2021-11-12: 4 g via INTRAVENOUS
  Filled 2021-11-12: qty 100

## 2021-11-12 MED ORDER — SODIUM CHLORIDE 0.9 % IV SOLN
25.0000 mg | Freq: Once | INTRAVENOUS | Status: AC
  Administered 2021-11-12: 25 mg via INTRAVENOUS
  Filled 2021-11-12: qty 1

## 2021-11-12 MED ORDER — HEPARIN SOD (PORK) LOCK FLUSH 100 UNIT/ML IV SOLN
500.0000 [IU] | Freq: Once | INTRAVENOUS | Status: AC | PRN
  Administered 2021-11-12: 500 [IU]
  Filled 2021-11-12: qty 5

## 2021-11-12 MED ORDER — SODIUM CHLORIDE 0.9% FLUSH
10.0000 mL | Freq: Once | INTRAVENOUS | Status: AC | PRN
  Administered 2021-11-12: 10 mL
  Filled 2021-11-12: qty 10

## 2021-11-12 MED ORDER — MAGNESIUM SULFATE 2 GM/50ML IV SOLN
2.0000 g | Freq: Once | INTRAVENOUS | Status: AC
  Administered 2021-11-12: 2 g via INTRAVENOUS
  Filled 2021-11-12: qty 50

## 2021-11-12 NOTE — Patient Instructions (Signed)
Magnesium Sulfate Injection What is this medication? MAGNESIUM SULFATE (mag NEE zee um SUL fate) prevents and treats low levels of magnesium in your body. It may also be used to prevent and treat seizures during pregnancy in people with high blood pressure disorders, such as preeclampsia or eclampsia. Magnesium plays an important role in maintaining the health of your muscles and nervous system. This medicine may be used for other purposes; ask your health care provider or pharmacist if you have questions. What should I tell my care team before I take this medication? They need to know if you have any of these conditions: Heart disease History of irregular heart beat Kidney disease An unusual or allergic reaction to magnesium sulfate, medications, foods, dyes, or preservatives Pregnant or trying to get pregnant Breast-feeding How should I use this medication? This medication is for infusion into a vein. It is given in a hospital or clinic setting. Talk to your care team about the use of this medication in children. While this medication may be prescribed for selected conditions, precautions do apply. Overdosage: If you think you have taken too much of this medicine contact a poison control center or emergency room at once. NOTE: This medicine is only for you. Do not share this medicine with others. What if I miss a dose? This does not apply. What may interact with this medication? Certain medications for anxiety or sleep Certain medications for seizures like phenobarbital Digoxin Medications that relax muscles for surgery Narcotic medications for pain This list may not describe all possible interactions. Give your health care provider a list of all the medicines, herbs, non-prescription drugs, or dietary supplements you use. Also tell them if you smoke, drink alcohol, or use illegal drugs. Some items may interact with your medicine. What should I watch for while using this medication? Your  condition will be monitored carefully while you are receiving this medication. You may need blood work done while you are receiving this medication. What side effects may I notice from receiving this medication? Side effects that you should report to your care team as soon as possible: Allergic reactions--skin rash, itching, hives, swelling of the face, lips, tongue, or throat High magnesium level--confusion, drowsiness, facial flushing, redness, sweating, muscle weakness, fast or irregular heartbeat, trouble breathing Low blood pressure--dizziness, feeling faint or lightheaded, blurry vision Side effects that usually do not require medical attention (report to your care team if they continue or are bothersome): Headache Nausea This list may not describe all possible side effects. Call your doctor for medical advice about side effects. You may report side effects to FDA at 1-800-FDA-1088. Where should I keep my medication? This medication is given in a hospital or clinic and will not be stored at home. NOTE: This sheet is a summary. It may not cover all possible information. If you have questions about this medicine, talk to your doctor, pharmacist, or health care provider.  2023 Elsevier/Gold Standard (2020-08-31 00:00:00)  

## 2021-11-12 NOTE — Progress Notes (Signed)
Magnesium level 1.1 this morning. Patient will receive 6 grams magnesium IV over 3 hours. ?

## 2021-11-19 ENCOUNTER — Inpatient Hospital Stay: Payer: Medicare HMO

## 2021-11-19 VITALS — BP 141/92 | HR 91 | Temp 97.9°F

## 2021-11-19 DIAGNOSIS — T451X5A Adverse effect of antineoplastic and immunosuppressive drugs, initial encounter: Secondary | ICD-10-CM | POA: Diagnosis not present

## 2021-11-19 DIAGNOSIS — Z452 Encounter for adjustment and management of vascular access device: Secondary | ICD-10-CM | POA: Diagnosis not present

## 2021-11-19 DIAGNOSIS — C9001 Multiple myeloma in remission: Secondary | ICD-10-CM | POA: Diagnosis not present

## 2021-11-19 DIAGNOSIS — E538 Deficiency of other specified B group vitamins: Secondary | ICD-10-CM | POA: Diagnosis not present

## 2021-11-19 DIAGNOSIS — G620 Drug-induced polyneuropathy: Secondary | ICD-10-CM | POA: Diagnosis not present

## 2021-11-19 DIAGNOSIS — K219 Gastro-esophageal reflux disease without esophagitis: Secondary | ICD-10-CM | POA: Diagnosis not present

## 2021-11-19 DIAGNOSIS — E119 Type 2 diabetes mellitus without complications: Secondary | ICD-10-CM | POA: Diagnosis not present

## 2021-11-19 DIAGNOSIS — C9000 Multiple myeloma not having achieved remission: Secondary | ICD-10-CM

## 2021-11-19 DIAGNOSIS — D801 Nonfamilial hypogammaglobulinemia: Secondary | ICD-10-CM | POA: Diagnosis not present

## 2021-11-19 LAB — MAGNESIUM: Magnesium: 1.1 mg/dL — ABNORMAL LOW (ref 1.7–2.4)

## 2021-11-19 MED ORDER — SODIUM CHLORIDE 0.9 % IV SOLN: 4.0000 g | Freq: Once | INTRAVENOUS | Status: DC

## 2021-11-19 MED ORDER — HEPARIN SOD (PORK) LOCK FLUSH 100 UNIT/ML IV SOLN
500.0000 [IU] | Freq: Once | INTRAVENOUS | Status: AC | PRN
  Administered 2021-11-19: 500 [IU]
  Filled 2021-11-19: qty 5

## 2021-11-19 MED ORDER — MAGNESIUM SULFATE 4 GM/100ML IV SOLN
4.0000 g | Freq: Once | INTRAVENOUS | Status: AC
  Administered 2021-11-19: 4 g via INTRAVENOUS
  Filled 2021-11-19: qty 100

## 2021-11-19 MED ORDER — SODIUM CHLORIDE 0.9 % IV SOLN
Freq: Once | INTRAVENOUS | Status: AC
  Filled 2021-11-19: qty 250

## 2021-11-19 MED ORDER — MAGNESIUM SULFATE 2 GM/50ML IV SOLN
2.0000 g | Freq: Once | INTRAVENOUS | Status: AC
  Administered 2021-11-19: 2 g via INTRAVENOUS
  Filled 2021-11-19: qty 50

## 2021-11-19 MED ORDER — SODIUM CHLORIDE 0.9% FLUSH
10.0000 mL | Freq: Once | INTRAVENOUS | Status: AC | PRN
  Administered 2021-11-19: 10 mL
  Filled 2021-11-19: qty 10

## 2021-11-19 MED ORDER — SODIUM CHLORIDE 0.9 % IV SOLN
25.0000 mg | Freq: Once | INTRAVENOUS | Status: AC
  Administered 2021-11-19: 25 mg via INTRAVENOUS
  Filled 2021-11-19: qty 1

## 2021-11-19 NOTE — Patient Instructions (Signed)
Magnesium Sulfate Injection What is this medication? MAGNESIUM SULFATE (mag NEE zee um SUL fate) prevents and treats low levels of magnesium in your body. It may also be used to prevent and treat seizures during pregnancy in people with high blood pressure disorders, such as preeclampsia or eclampsia. Magnesium plays an important role in maintaining the health of your muscles and nervous system. This medicine may be used for other purposes; ask your health care provider or pharmacist if you have questions. What should I tell my care team before I take this medication? They need to know if you have any of these conditions: Heart disease History of irregular heart beat Kidney disease An unusual or allergic reaction to magnesium sulfate, medications, foods, dyes, or preservatives Pregnant or trying to get pregnant Breast-feeding How should I use this medication? This medication is for infusion into a vein. It is given in a hospital or clinic setting. Talk to your care team about the use of this medication in children. While this medication may be prescribed for selected conditions, precautions do apply. Overdosage: If you think you have taken too much of this medicine contact a poison control center or emergency room at once. NOTE: This medicine is only for you. Do not share this medicine with others. What if I miss a dose? This does not apply. What may interact with this medication? Certain medications for anxiety or sleep Certain medications for seizures like phenobarbital Digoxin Medications that relax muscles for surgery Narcotic medications for pain This list may not describe all possible interactions. Give your health care provider a list of all the medicines, herbs, non-prescription drugs, or dietary supplements you use. Also tell them if you smoke, drink alcohol, or use illegal drugs. Some items may interact with your medicine. What should I watch for while using this medication? Your  condition will be monitored carefully while you are receiving this medication. You may need blood work done while you are receiving this medication. What side effects may I notice from receiving this medication? Side effects that you should report to your care team as soon as possible: Allergic reactions--skin rash, itching, hives, swelling of the face, lips, tongue, or throat High magnesium level--confusion, drowsiness, facial flushing, redness, sweating, muscle weakness, fast or irregular heartbeat, trouble breathing Low blood pressure--dizziness, feeling faint or lightheaded, blurry vision Side effects that usually do not require medical attention (report to your care team if they continue or are bothersome): Headache Nausea This list may not describe all possible side effects. Call your doctor for medical advice about side effects. You may report side effects to FDA at 1-800-FDA-1088. Where should I keep my medication? This medication is given in a hospital or clinic and will not be stored at home. NOTE: This sheet is a summary. It may not cover all possible information. If you have questions about this medicine, talk to your doctor, pharmacist, or health care provider.  2023 Elsevier/Gold Standard (2020-08-31 00:00:00)  

## 2021-11-19 NOTE — Progress Notes (Unsigned)
Magnesium 1.1. 6 grams of IV magnesium given.

## 2021-11-27 ENCOUNTER — Inpatient Hospital Stay (HOSPITAL_BASED_OUTPATIENT_CLINIC_OR_DEPARTMENT_OTHER): Payer: Medicare HMO | Admitting: Oncology

## 2021-11-27 ENCOUNTER — Encounter: Payer: Self-pay | Admitting: Oncology

## 2021-11-27 ENCOUNTER — Inpatient Hospital Stay: Payer: Medicare HMO

## 2021-11-27 VITALS — BP 129/63 | HR 70 | Temp 98.9°F | Resp 17 | Wt 158.5 lb

## 2021-11-27 DIAGNOSIS — R110 Nausea: Secondary | ICD-10-CM

## 2021-11-27 DIAGNOSIS — E119 Type 2 diabetes mellitus without complications: Secondary | ICD-10-CM | POA: Diagnosis not present

## 2021-11-27 DIAGNOSIS — D801 Nonfamilial hypogammaglobulinemia: Secondary | ICD-10-CM

## 2021-11-27 DIAGNOSIS — G620 Drug-induced polyneuropathy: Secondary | ICD-10-CM

## 2021-11-27 DIAGNOSIS — Z9484 Stem cells transplant status: Secondary | ICD-10-CM

## 2021-11-27 DIAGNOSIS — K219 Gastro-esophageal reflux disease without esophagitis: Secondary | ICD-10-CM | POA: Diagnosis not present

## 2021-11-27 DIAGNOSIS — C9001 Multiple myeloma in remission: Secondary | ICD-10-CM | POA: Diagnosis not present

## 2021-11-27 DIAGNOSIS — T451X5A Adverse effect of antineoplastic and immunosuppressive drugs, initial encounter: Secondary | ICD-10-CM | POA: Diagnosis not present

## 2021-11-27 DIAGNOSIS — Z5111 Encounter for antineoplastic chemotherapy: Secondary | ICD-10-CM | POA: Diagnosis not present

## 2021-11-27 DIAGNOSIS — C9000 Multiple myeloma not having achieved remission: Secondary | ICD-10-CM

## 2021-11-27 DIAGNOSIS — E538 Deficiency of other specified B group vitamins: Secondary | ICD-10-CM

## 2021-11-27 DIAGNOSIS — Z452 Encounter for adjustment and management of vascular access device: Secondary | ICD-10-CM | POA: Diagnosis not present

## 2021-11-27 DIAGNOSIS — D649 Anemia, unspecified: Secondary | ICD-10-CM

## 2021-11-27 LAB — CBC WITH DIFFERENTIAL/PLATELET
Abs Immature Granulocytes: 0.01 10*3/uL (ref 0.00–0.07)
Basophils Absolute: 0 10*3/uL (ref 0.0–0.1)
Basophils Relative: 0 %
Eosinophils Absolute: 0.2 10*3/uL (ref 0.0–0.5)
Eosinophils Relative: 3 %
HCT: 33.9 % — ABNORMAL LOW (ref 36.0–46.0)
Hemoglobin: 11 g/dL — ABNORMAL LOW (ref 12.0–15.0)
Immature Granulocytes: 0 %
Lymphocytes Relative: 22 %
Lymphs Abs: 1.3 10*3/uL (ref 0.7–4.0)
MCH: 26.2 pg (ref 26.0–34.0)
MCHC: 32.4 g/dL (ref 30.0–36.0)
MCV: 80.7 fL (ref 80.0–100.0)
Monocytes Absolute: 0.5 10*3/uL (ref 0.1–1.0)
Monocytes Relative: 8 %
Neutro Abs: 3.9 10*3/uL (ref 1.7–7.7)
Neutrophils Relative %: 67 %
Platelets: 108 10*3/uL — ABNORMAL LOW (ref 150–400)
RBC: 4.2 MIL/uL (ref 3.87–5.11)
RDW: 13.2 % (ref 11.5–15.5)
WBC: 5.9 10*3/uL (ref 4.0–10.5)
nRBC: 0 % (ref 0.0–0.2)

## 2021-11-27 LAB — COMPREHENSIVE METABOLIC PANEL
ALT: 48 U/L — ABNORMAL HIGH (ref 0–44)
AST: 44 U/L — ABNORMAL HIGH (ref 15–41)
Albumin: 4 g/dL (ref 3.5–5.0)
Alkaline Phosphatase: 89 U/L (ref 38–126)
Anion gap: 9 (ref 5–15)
BUN: 23 mg/dL (ref 8–23)
CO2: 25 mmol/L (ref 22–32)
Calcium: 8.5 mg/dL — ABNORMAL LOW (ref 8.9–10.3)
Chloride: 102 mmol/L (ref 98–111)
Creatinine, Ser: 1.14 mg/dL — ABNORMAL HIGH (ref 0.44–1.00)
GFR, Estimated: 53 mL/min — ABNORMAL LOW (ref >60)
Glucose, Bld: 148 mg/dL — ABNORMAL HIGH (ref 70–99)
Potassium: 4 mmol/L (ref 3.5–5.1)
Sodium: 136 mmol/L (ref 135–145)
Total Bilirubin: 0.6 mg/dL (ref 0.3–1.2)
Total Protein: 7.1 g/dL (ref 6.5–8.1)

## 2021-11-27 LAB — MAGNESIUM: Magnesium: 1.1 mg/dL — ABNORMAL LOW (ref 1.7–2.4)

## 2021-11-27 MED ORDER — SODIUM CHLORIDE 0.9 % IV SOLN: 6.0000 g | Freq: Once | INTRAVENOUS | Status: DC

## 2021-11-27 MED ORDER — MAGNESIUM SULFATE 2 GM/50ML IV SOLN
2.0000 g | Freq: Once | INTRAVENOUS | Status: AC
  Administered 2021-11-27: 2 g via INTRAVENOUS
  Filled 2021-11-27: qty 50

## 2021-11-27 MED ORDER — SODIUM CHLORIDE 0.9 % IV SOLN
Freq: Once | INTRAVENOUS | Status: AC
  Filled 2021-11-27: qty 250

## 2021-11-27 MED ORDER — SODIUM CHLORIDE 0.9 % IV SOLN
25.0000 mg | Freq: Once | INTRAVENOUS | Status: AC
  Administered 2021-11-27: 25 mg via INTRAVENOUS
  Filled 2021-11-27: qty 1

## 2021-11-27 MED ORDER — SODIUM CHLORIDE 0.9% FLUSH
10.0000 mL | Freq: Once | INTRAVENOUS | Status: AC | PRN
  Administered 2021-11-27: 10 mL
  Filled 2021-11-27: qty 10

## 2021-11-27 MED ORDER — MAGNESIUM SULFATE 4 GM/100ML IV SOLN
4.0000 g | Freq: Once | INTRAVENOUS | Status: AC
  Administered 2021-11-27: 4 g via INTRAVENOUS
  Filled 2021-11-27: qty 100

## 2021-11-27 MED ORDER — HEPARIN SOD (PORK) LOCK FLUSH 100 UNIT/ML IV SOLN
500.0000 [IU] | Freq: Once | INTRAVENOUS | Status: AC | PRN
  Administered 2021-11-27: 500 [IU]
  Filled 2021-11-27: qty 5

## 2021-11-27 NOTE — Progress Notes (Signed)
6 grams of IV magnesium over 3 hours. Port needle remains accessed as pt will return to clinic tomorrow for IVIG treatment. VSS. Discharged to home.

## 2021-11-27 NOTE — Progress Notes (Signed)
Intermittent diarrhea. Stopped taking oral magnesium. Low back pain with numbness in feet from pinched nerves. Appetite is good.

## 2021-11-27 NOTE — Progress Notes (Signed)
Hematology/Oncology Progress note [phone] [phone]        Clinic Day:  11/27/2021   Referring physician: Glean Hess, MD  Chief Complaint: Sarah [name] is a 66 y.o. 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   Initial bone marrow revealed 80% plasma cells.  Lambda free light chains were 1340.  She had nephrotic range proteinuria.  She initially underwent induction with RVD.  Revlimid maintenance was discontinued on 01/21/2017 secondary to intolerance.     07/19/2019 Bone marrow aspirate and biopsy on  revealed a normocellular marrow with but increased lambda-restricted plasma cells (9% aspirate, 40% CD138 immunohistochemistry).  Findings were consistent with recurrent plasma cell myeloma.  Flow cytometry revealed no monoclonal B-cell or phenotypically aberrant T-cell population. Cytogenetics were 37, XX (normal).  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    Lambda light chains have been followed: 22.2 (ratio 0.56) on 07/03/2017, 30.8 (ratio 0.78) on 09/02/2017, 36.9 (ratio 0.40) on 10/21/2017, 37.4 (ratio 0.41) on 12/16/2017, 70.7(ratio 0.31)  on 02/17/2018, 64.2 (ratio 0.27) on 04/07/2018, 78.9 (ratio 0.18) on 05/26/2018, 128.8 (ratio 0.17) on 08/06/2018, 181.5 (ratio 0.13) on 10/08/2018, 130.9 (ratio 0.13) on 10/20/2018, 160.7 (ratio  0.10) on 12/09/2018, 236.6 (ratio 0.07) on 02/01/2019, 363.6 (ratio 0.04) on 03/22/2019, 404.8 (ratio 0.04) on 04/05/2019, 420.7 (ratio 0.03) on 05/24/2019, 573.4 (ratio 0.03) on 06/23/2019, 451.05 (ratio 0.02) on 08/20/2019, 47.2 (ratio 0.13) on 10/25/2019, 22.4 (ratio 0.21) on 11/25/2019, 16.5 (ratio 0.33) on 01/10/2020, 14.6 (ratio 0.34) on 02/07/2020, 13.1 (ratio 0.31) on 03/07/2020, 10.1 (ratio 0.38) on 04/10/2020, and 9.5 (ratio 0.21) on 06/19/2020.   24 hour UPEP on 06/03/2019 revealed kappa free light chains 95.76, lambda free light chains 1,260.71, and ratio 0.08.  24 hour UPEP on 08/23/2019 revealed total protein of 782 mg/24 hrs with lambda free light chains 1,084.16 mg/L and ratio of 0.10 (1.03-31.76).  M spike in urine was 46.1% (361 mg/24 hrs).    Bone survey on 04/08/2016 and 05/28/2017 revealed no definite lytic lesion seen in the visualized skeleton.  Bone survey on 11/19/2018 revealed no suspicious lucent lesions and no acute bony abnormality.  PET scan on 07/12/2019 revealed no focal metabolic activity to suggest active myeloma within the skeleton. There were no lytic lesions identified on the CT portion of the exam or soft tissue plasmacytomas. There was no evidence of multiple myeloma.    Pretreatment RBC phenotype on 09/23/2019 was positive for C, e, DUFFY B, KIDD B, M, S, and s antigen; negative for c, E, KELL, DUFFY A, KIDD A, and N antigen.    09/27/2019 - 10/25/2019; 12/09/2019 - 03/13/2020 6 cycles of daratumumab and hyaluronidase-fihj, Pomalyst, and Decadron (DPd) .  Cycle #1 was complicated by fever and neutropenia requiring admission.  Cycle #2 was complicated by pneumonia requiring admission.  Cycle #6 was complicated with an ER evaluation for an elevated lactic acid.   05/10/2020 second autologous stem cell transplant at  [facility]   She underwent conditioning with melphalan 140 mg/m2.    She is s/p week #3 Velcade maintenance (began 08/31/2020 - 09/28/2020).  She has no increase  in neuropathy.  She has severe aortic stenosis.  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 Cardiology is following for a possible TAVR in the future.  Echo on 07/05/2020 revealed moderate to severe aortic stenosis with an EF of 50-55%.  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 Zometa was discontinued in 01/2017.  She has chronic nausea on Phenergan.     B12 deficiency.  B12 was 254 on 04/09/2017, 295 on 08/20/2018, and 391 on 10/08/2018.  She was on oral B12.  She received B12 monthly (last 09/14/2020).  Folate was 12.1 on 01/10/2020.    She has iron deficiency.  Ferritin was 32 on 07/01/2019.  She received Venofer on 07/15/2019 and 07/22/2019.   She has hypogammaglobulinemia.  IgG was 245 on 11/25/2019.  She received monthly IVIG (07/22/021 - 03/22/2020).  She received IVIG 400 mg/kg on 01/20/2020, 200 mg/kg on 02/17/2020, and 300 mg/kg on 03/22/2020.  IVIG on 84/69/6295 was complicated by acute renal failure. IgG trough level was 418 on 02/17/2020.  10/15/2019 - 10/20/2019 She was admitted to [facility] with fever and neutropenia.  Cultures were negative.  CXR was negative. She received broad spectrum antibiotics and daily Granix.  She received IVF for acute renal insufficiency due to diarrhea and dehydration.  Creatine was 1.66 on admission and 1.12 on discharge.    03/01/2020 - 03/05/2020 admitted to [facility] from with fever and neutropenia. CXR revealed no active cardiopulmonary disease. Chest CT with contrast revealed no acute intrathoracic pathology. There were findings which could be suggestive of prior granulomatous disease. She was treated with Cefepime and Vancomycin, then switched to ciprofloxacin on 03/03/2020.   05/08/2020 - 12/05/2021The patient was admitted to [facility] from  for autologous bone marrow transplant.   She received conditioning with melphalan 140 mg/m2.  Bone marrow transplant was on  05/10/2020. The patient developed fevers daily from 05/19/2020 - 05/24/2020. Blood cultures were + for strept sanguis bacteremia.  She was treated cefepime, vancomycin, and solumedrol for possible engraftment syndrome. Cefepime was switched to ceftriaxone; she completed 2 weeks of ceftriaxone on 06/03/2020. She had chemotherapy induced diarrhea.  She experienced urinary retention.  Feb 2022 patient has been on maintenance Velcade 2 mg every 2 weeks.  Chronic diarrhea seen by gastroenterology Dr. Vicente Males and was recommended to avoid artificial sugars in her diet including Diet Coke and coffee mate.  She feels some improvement of her diarrhea frequency since the dietary modification.  GI profile PCR negative, C. difficile toxin A+ B negative, fecal calprotectin 77 12/19/2020 colonoscopy showed 2 subcentimeter polyps in the ascending colon, resected and removed.  Pathology showed tubular adenoma.  No malignancy.  Normal mucosa in the entire examined colon.  Biopsied, pathology showed colonic mucosa with no significant pathological alteration.  Negative for active inflammation and features of chronicity.  Negative for microscopic colitis, dysplasia, and malignanc  Diabetes, patient has discontinued metformin due to diarrhea and started on glipizide 2.5 mg   03/05/2021-03/09/2021 Patient was hospitalized due to fever and chills, nonproductive cough, shortness of breath. X-ray showed right lower lobe atelectasis versus pneumonia.  Blood cultures negative.  Urine culture showed 60,000 colonies of Enterococcus facialis.  Patient received antibiotics. Patient also had abdominal pelvis CT scan for work-up of intermittent  lower abdomen pain.No acute abnormality.   05/14/21 last dose of Velcade maintenance.  establish care with neurology Dr. Gurney Maxin and her neuropathy regimen has been switched from gabapentin to Lyrica. 05/29/2021 patient got influenza   neuropathy medication has been switched from  gabapentin/Lyrica to nortriptyline.  08/18/2021 Hospitalized due to pneumonia from metapnuemovirus, hospitalization was complicated with NSTEMI. She received heparin gtt.  Treated with antibiotics for coverage of pneumonia,  diuretics for pulmonary edema. She received IVIG 475m/kg x 1 for hypoglobulinemia. Discharged on 08/25/21 with Augmentin and Zithromax.   May 2023, Daratumumab being held for [facility] infectious disease physician Dr. GFatima Sangerdue to frequent infection.  INTERVAL HISTORY COgechi Kuehnelis a 66y.o. female who has above history reviewed by me today presents for follow up visit for management of multiple myeloma.  Patient has been on weekly magnesium infusion as needed hypomagnesemia Patient gets IVIG monthly. Today she reports feeling well.  No new complaints.  Denies any additional infection episodes. 10/31/2021, [facility]  Recommendation was reviewed.   Past Medical History:  Diagnosis Date   Anemia    Anxiety    Aortic stenosis    a. 05/2020 Echo: EF 45-50%, sev AS - [facility] sugg of tricuspid valve w/ fusing of 2 leaflets-TAVR deferred in setting of acute infxn; b. 07/2020 Echo: EF 50-55%, mod-sev AS; c. 12/2020 Echo: EF>55%. Mod-sev paradoxical low-flow low-gradient AS; d. 08/2021 Echo: EF 35-40%, severe AS, triv AI, mild MR.   Arthritis    Bicuspid aortic valve    Bisphosphonate-associated osteonecrosis of the jaw ([place] 02/25/2017   Due to Zometa   Cardiomyopathy, idiopathic ([place]    a. Variable EF over time; b. 08/2017 Echo: EF 40%; b. 03/2020 Echo: EF 55-60%; c. 05/2020 Echo: EF 45-50%; d. 07/2020 Echo: EF 50-55%; e. 12/2020 Echo: EF>55%; f. 08/2021 Echo: EF 35-40%.   Chronic heart failure with preserved ejection fraction (HFpEF) ([place]    a. Variable EF over time; b. 08/2017 Echo: EF 40%; b. 03/2020 Echo: EF 55-60%; c. 05/2020 Echo: EF 45-50%; d. 07/2020 Echo: EF 50-55%; e. 12/2020 Echo: EF>55%; f. 08/2021 Echo: EF  35-40%, no RV, mildly dil LA, mild MR, triv AI, severe AS.   CKD (chronic kidney disease) stage 3, GFR 30-59 ml/min (HCC)    Depression    Diabetes mellitus (HCC)    Dizziness    Fatty liver    Frequent falls    GERD (gastroesophageal reflux disease)    Gout    Heart murmur    History of blood transfusion    History of bone marrow transplant ([place]    History of uterine fibroid    Hx of cardiac catheterization    a. 01/2016 Cath ([facility]- after abnl nuc): Nl cors.   Hypertension    Hypomagnesemia    IDA (iron deficiency anemia)    Multiple myeloma (HCC)    Personal history of chemotherapy    PSVT (paroxysmal supraventricular tachycardia) ([place]    a. [facility]/p [place].   PVC's (premature ventricular contractions)    a. Well-managed w/ bisoprolol in outpt setting.   Renal cyst     Past Surgical History:  Procedure Laterality Date   ABDOMINAL HYSTERECTOMY     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   CARDIAC ELECTROPHYSIOLOGY MAPPING AND ABLATION     CARPAL TUNNEL RELEASE Bilateral    CHOLECYSTECTOMY  2008   COLONOSCOPY WITH PROPOFOL N/A 05/07/2017  Procedure: COLONOSCOPY WITH PROPOFOL;  Surgeon: [name], MD;  Location[facility] ENDOSCOPY;  Service: Gastroenterology;  Laterality: N/A;   COLONOSCOPY WITH PROPOFOL N/A 12/19/2020   Procedure: COLONOSCOPY WITH PROPOFOL;  Surgeon: [name], MD;  Location[facility] ENDOSCOPY;  Service: Gastroenterology;  Laterality: N/A;   ESOPHAGOGASTRODUODENOSCOPY (EGD) WITH PROPOFOL N/A 05/07/2017   Procedure: ESOPHAGOGASTRODUODENOSCOPY (EGD) WITH PROPOFOL;  Surgeon: [name], MD;  Location: [facility] ENDOSCOPY;  Service: Gastroenterology;  Laterality: N/A;   FOOT SURGERY Bilateral    INCONTINENCE SURGERY  2009   INTERSTIM IMPLANT PLACEMENT     other     over active bladder   OTHER SURGICAL HISTORY     bladder stimulator    PARTIAL HYSTERECTOMY  03/1996   fibroids   PORTA CATH INSERTION N/A 03/10/2019   Procedure: PORTA CATH INSERTION;   Surgeon: Algernon Huxley, MD;  Location: [place] CV LAB;  Service: Cardiovascular;  Laterality: N/A;   TONSILLECTOMY  2007    Family History  Problem Relation Age of Onset   Colon cancer Father    Renal Disease Father    Diabetes Mellitus II Father    Melanoma Paternal Grandmother    Breast cancer Maternal Aunt 85   Anemia Mother    Heart disease Mother    Heart failure Mother    Renal Disease Mother    Congestive Heart Failure Mother    Heart disease Maternal Uncle    Throat cancer Maternal Uncle    Lung cancer Maternal Uncle    Liver disease Maternal Uncle    Heart failure Maternal Uncle    Hearing loss Son 50       Suicide     Social History:  reports that she quit smoking about 30 years ago. Her smoking use included cigarettes. She has a 20.00 pack-year smoking history. She has never used smokeless tobacco. She reports current alcohol use. She reports that she does not use drugs.  She is on disability. She notes exposure to perchloroethylene [facility]).   Allergies:  Allergies  Allergen Reactions   Oxycodone-Acetaminophen Anaphylaxis    Swelling and rash   Celebrex [Celecoxib] Diarrhea   Codeine    Plerixafor     In 2016 during ASCT collection patient developed fever to 103.74F and required hospitalization   Benadryl [Diphenhydramine] Palpitations   Morphine Itching and Rash   Ondansetron Diarrhea   Tylenol [Acetaminophen] Itching and Rash    Current Medications: Current Outpatient Medications  Medication Sig Dispense Refill   acyclovir (ZOVIRAX) 400 MG tablet Take 1 tablet (400 mg total) by mouth 2 (two) times daily. 180 tablet 1   albuterol (VENTOLIN HFA) 108 (90 Base) MCG/ACT inhaler Inhale 2 puffs into the lungs every 6 (six) hours as needed for wheezing or shortness of breath. 8 g 0   allopurinol (ZYLOPRIM) 100 MG tablet TAKE 1 TABLET(100 MG) BY MOUTH DAILY as needed 90 tablet 1   bisoprolol (ZEBETA) 5 MG tablet Take 0.5 tablets (2.5 mg total) by mouth daily. 30  tablet 0   diclofenac sodium (VOLTAREN) 1 % GEL Apply 2 g topically 4 (four) times daily. 100 g 1   feeding supplement (ENSURE ENLIVE / ENSURE PLUS) LIQD Take 237 mLs by mouth 2 (two) times daily between meals. 237 mL 12   FLUoxetine (PROZAC) 40 MG capsule TAKE 1 CAPSULE EVERY DAY 90 capsule 0   glipiZIDE (GLUCOTROL XL) 2.5 MG 24 hr tablet Take 1 tablet (2.5 mg total) by mouth daily with breakfast. 90 tablet 1  glucose blood test strip      montelukast (SINGULAIR) 10 MG tablet Take 1 tablet (10 mg total) by mouth See admin instructions. Take 1 tab daily for 2 days after daratumumab treatments 30 tablet 1   Multiple Vitamin (MULTIVITAMIN) tablet Take 1 tablet by mouth daily.     omeprazole (PRILOSEC) 40 MG capsule TAKE 1 CAPSULE (40 MG TOTAL) BY MOUTH IN THE MORNING AND AT BEDTIME. 180 capsule 1   promethazine (PHENERGAN) 25 MG tablet Take 1 tablet (25 mg total) by mouth every 6 (six) hours as needed for nausea or vomiting. 90 tablet 1   traMADol (ULTRAM) 50 MG tablet Take 50 mg daily, can take one other tab during the day if needed     traZODone (DESYREL) 100 MG tablet TAKE 1 TABLET AT BEDTIME 90 tablet 0   fluconazole (DIFLUCAN) 150 MG tablet Take 1 tablet (150 mg) by mouth on day one then repeat dose 3 days later as needed for vaginal yeast infection. (Patient not taking: Reported on 11/27/2021) 2 tablet 0   fluticasone (FLONASE) 50 MCG/ACT nasal spray Place 2 sprays into both nostrils daily. (Patient not taking: Reported on 11/27/2021) 16 g 2   guaiFENesin-dextromethorphan (ROBITUSSIN DM) 100-10 MG/5ML syrup Take 5 mLs by mouth every 4 (four) hours as needed for cough. (Patient not taking: Reported on 11/27/2021) 118 mL 0   magnesium chloride (SLOW-MAG) 64 MG TBEC SR tablet Take 1 tablet (64 mg total) by mouth 2 (two) times daily. (Patient not taking: Reported on 11/27/2021) 60 tablet 1   nitroGLYCERIN (NITROSTAT) 0.4 MG SL tablet Place 1 tablet (0.4 mg total) under the tongue every 5 (five) minutes  as needed for chest pain. (Patient not taking: Reported on 10/29/2021) 20 tablet 12   No current facility-administered medications for this visit.   Facility-Administered Medications Ordered in Other Visits  Medication Dose Route Frequency Provider Last Rate Last Admin   heparin lock flush 100 unit/mL  500 Units Intracatheter Once PRN Earlie Server, MD       sodium chloride flush (NS) 0.9 % injection 10 mL  10 mL Intravenous PRN Earlie Server, MD   10 mL at 04/02/21 2595    Review of Systems  Constitutional:  Negative for chills, fever, malaise/fatigue and weight loss.  HENT:  Negative for sore throat.   Eyes:  Negative for redness.  Respiratory:  Negative for cough, shortness of breath and wheezing.   Cardiovascular:  Negative for chest pain, palpitations and leg swelling.  Gastrointestinal:  Positive for diarrhea and nausea. Negative for abdominal pain, blood in stool and vomiting.  Genitourinary:  Negative for dysuria.  Musculoskeletal:  Positive for back pain and joint pain. Negative for myalgias.  Skin:  Negative for rash.  Neurological:  Positive for tingling and sensory change. Negative for dizziness and tremors.  Endo/Heme/Allergies:  Does not bruise/bleed easily.  Psychiatric/Behavioral:  Negative for hallucinations.    Performance status (ECOG): 1  Vitals Blood pressure 129/63, pulse 70, temperature 98.9 F (37.2 C), temperature source Oral, resp. rate 17, weight 158 lb 8 oz (71.9 kg), SpO2 99 %.  Physical Exam Vitals and nursing note reviewed.  Constitutional:      General: She is not in acute distress.    Appearance: She is well-developed. She is not ill-appearing, toxic-appearing or diaphoretic.     Interventions: Face mask in place.  HENT:     Head: Normocephalic and atraumatic.     Mouth/Throat:     Mouth: Mucous membranes are  moist.     Pharynx: Oropharynx is clear.  Eyes:     General: No scleral icterus.    Pupils: Pupils are equal, round, and reactive to light.   Cardiovascular:     Rate and Rhythm: Normal rate and regular rhythm.     Heart sounds: Normal heart sounds. No murmur heard. Pulmonary:     Effort: Pulmonary effort is normal. No respiratory distress.     Breath sounds: Normal breath sounds. No wheezing or rales.  Chest:     Chest wall: No tenderness.  Abdominal:     General: Bowel sounds are normal. There is no distension.     Palpations: Abdomen is soft. There is no hepatomegaly, splenomegaly or mass.     Tenderness: There is no abdominal tenderness. There is no guarding or rebound.  Musculoskeletal:        General: No tenderness. Normal range of motion.     Cervical back: Normal range of motion and neck supple.     Right lower leg: No edema.     Left lower leg: No edema.  Lymphadenopathy:     Head:     Right side of head: No preauricular, posterior auricular or occipital adenopathy.     Left side of head: No preauricular, posterior auricular or occipital adenopathy.     Cervical: No cervical adenopathy.     Upper Body:     Right upper body: No supraclavicular adenopathy.     Left upper body: No supraclavicular adenopathy.  Skin:    General: Skin is warm and dry.  Neurological:     Mental Status: She is alert and oriented to person, place, and time. Mental status is at baseline.  Psychiatric:        Mood and Affect: Mood normal.   Laboratory data Infusion on 11/27/2021  Component Date Value Ref Range Status   Magnesium 11/27/2021 1.1 (L)  1.7 - 2.4 mg/dL Final   Performed at [facility], [place], [place] [address]   Sodium 11/27/2021 136  135 - 145 mmol/L Final   Potassium 11/27/2021 4.0  3.5 - 5.1 mmol/L Final   Chloride 11/27/2021 102  98 - 111 mmol/L Final   CO2 11/27/2021 25  22 - 32 mmol/L Final   Glucose, Bld 11/27/2021 148 (H)  70 - 99 mg/dL Final   Glucose reference range applies only to samples taken after fasting for at least 8 hours.   BUN 11/27/2021 23  8 - 23 mg/dL Final    Creatinine, Ser 11/27/2021 1.14 (H)  0.44 - 1.00 mg/dL Final   Calcium 11/27/2021 8.5 (L)  8.9 - 10.3 mg/dL Final   Total Protein 11/27/2021 7.1  6.5 - 8.1 g/dL Final   Albumin 11/27/2021 4.0  3.5 - 5.0 g/dL Final   AST 11/27/2021 44 (H)  15 - 41 U/L Final   ALT 11/27/2021 48 (H)  0 - 44 U/L Final   Alkaline Phosphatase 11/27/2021 89  38 - 126 U/L Final   Total Bilirubin 11/27/2021 0.6  0.3 - 1.2 mg/dL Final   GFR, Estimated 11/27/2021 53 (L)  >60 mL/min Final   Comment: (NOTE) Calculated using the CKD-EPI Creatinine Equation (2021)    Anion gap 11/27/2021 9  5 - 15 Final   Performed at [facility], [place], [place] [address]   WBC 11/27/2021 5.9  4.0 - 10.5 K/uL Final   RBC 11/27/2021 4.20  3.87 - 5.11 MIL/uL Final  Hemoglobin 11/27/2021 11.0 (L)  12.0 - 15.0 g/dL Final   HCT 11/27/2021 33.9 (L)  36.0 - 46.0 % Final   MCV 11/27/2021 80.7  80.0 - 100.0 fL Final   MCH 11/27/2021 26.2  26.0 - 34.0 pg Final   MCHC 11/27/2021 32.4  30.0 - 36.0 g/dL Final   RDW 11/27/2021 13.2  11.5 - 15.5 % Final   Platelets 11/27/2021 108 (L)  150 - 400 K/uL Final   nRBC 11/27/2021 0.0  0.0 - 0.2 % Final   Neutrophils Relative % 11/27/2021 67  % Final   Neutro Abs 11/27/2021 3.9  1.7 - 7.7 K/uL Final   Lymphocytes Relative 11/27/2021 22  % Final   Lymphs Abs 11/27/2021 1.3  0.7 - 4.0 K/uL Final   Monocytes Relative 11/27/2021 8  % Final   Monocytes Absolute 11/27/2021 0.5  0.1 - 1.0 K/uL Final   Eosinophils Relative 11/27/2021 3  % Final   Eosinophils Absolute 11/27/2021 0.2  0.0 - 0.5 K/uL Final   Basophils Relative 11/27/2021 0  % Final   Basophils Absolute 11/27/2021 0.0  0.0 - 0.1 K/uL Final   Immature Granulocytes 11/27/2021 0  % Final   Abs Immature Granulocytes 11/27/2021 0.01  0.00 - 0.07 K/uL Final   Performed at [facility], [address] [place], [place] [address]    Assessment/Plan 1. Encounter for antineoplastic chemotherapy   2.  Chemotherapy-induced neuropathy ([place])   3. Multiple myeloma in remission ([place])   4. 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[place])   5. Hypogammaglobulinemia ([place])   6. Hypomagnesemia     # Recurrent lambda light chain multiple myeloma s/p second autologous bone marrow transplant [05/10/2020] Most recent Multiple myeloma showed negative M protein.  Light chain ratio is normal. 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  Attention on follow-up. Labs reviewed and discussed with patient.  Multiple myeloma panel is pending. Daratumumab on hold. Continue acyclovir prophylaxis.  -[place] transplant RN coordinator [phone]).  #Hypoglobulinemia post stem cell transplantation.  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   Follows up with neurology.  Now on nortriptyline.  #chronic diarrhea,antidiarrhea PRN.  #Chronic hypomagnesemia proceed with  IV magnesium today. 6 Continue weekly magnesium +/- IV magnesium.   Continue Slow-Mag 1 tab every other day.   #Dysphagia, chronic nausea, antiemetics as needed. # B12 deficiency, B12 level has improved.   #CKD, stale creatinine.  #Transaminitis, trending down.  Recommend patient to reestablish care with GI Dr. Vicente Males.   RTC:  No chemo  treatment, IVIG x 1 lab/mag weekly x 3 Lab/MD/Mag/ and Day 2 IVIG  in 4 weeks.   She may proceed with epidural steroid injection.  I discussed the assessment and treatment plan with the patient.  The patient was provided an opportunity to ask questions and all were answered.  The patient agreed with the plan and demonstrated an understanding of the instructions.  The patient was advised to call back if the symptoms worsen or if the condition fails to improve as anticipated.   Earlie Server, MD, PhD 11/27/2021

## 2021-11-28 ENCOUNTER — Inpatient Hospital Stay: Payer: Medicare HMO

## 2021-11-28 DIAGNOSIS — E119 Type 2 diabetes mellitus without complications: Secondary | ICD-10-CM | POA: Diagnosis not present

## 2021-11-28 DIAGNOSIS — C9001 Multiple myeloma in remission: Secondary | ICD-10-CM | POA: Diagnosis not present

## 2021-11-28 DIAGNOSIS — Z452 Encounter for adjustment and management of vascular access device: Secondary | ICD-10-CM | POA: Diagnosis not present

## 2021-11-28 DIAGNOSIS — T451X5A Adverse effect of antineoplastic and immunosuppressive drugs, initial encounter: Secondary | ICD-10-CM | POA: Diagnosis not present

## 2021-11-28 DIAGNOSIS — C9000 Multiple myeloma not having achieved remission: Secondary | ICD-10-CM

## 2021-11-28 DIAGNOSIS — E538 Deficiency of other specified B group vitamins: Secondary | ICD-10-CM | POA: Diagnosis not present

## 2021-11-28 DIAGNOSIS — K219 Gastro-esophageal reflux disease without esophagitis: Secondary | ICD-10-CM | POA: Diagnosis not present

## 2021-11-28 DIAGNOSIS — G620 Drug-induced polyneuropathy: Secondary | ICD-10-CM | POA: Diagnosis not present

## 2021-11-28 DIAGNOSIS — D801 Nonfamilial hypogammaglobulinemia: Secondary | ICD-10-CM | POA: Diagnosis not present

## 2021-11-28 LAB — KAPPA/LAMBDA LIGHT CHAINS
Kappa free light chain: 5.1 mg/L (ref 3.3–19.4)
Kappa, lambda light chain ratio: 1.19 (ref 0.26–1.65)
Lambda free light chains: 4.3 mg/L — ABNORMAL LOW (ref 5.7–26.3)

## 2021-11-28 MED ORDER — DIPHENHYDRAMINE HCL 25 MG PO CAPS: 25.0000 mg | ORAL_CAPSULE | Freq: Four times a day (QID) | ORAL | Status: DC | PRN

## 2021-11-28 MED ORDER — IMMUNE GLOBULIN (HUMAN) 20 GM/200ML IV SOLN: 400.0000 mg/kg | Freq: Once | INTRAVENOUS | Status: DC

## 2021-11-28 MED ORDER — SODIUM CHLORIDE 0.9% FLUSH
10.0000 mL | Freq: Once | INTRAVENOUS | Status: AC
  Administered 2021-11-28: 10 mL via INTRAVENOUS
  Filled 2021-11-28: qty 10

## 2021-11-28 MED ORDER — HEPARIN SOD (PORK) LOCK FLUSH 100 UNIT/ML IV SOLN
500.0000 [IU] | Freq: Once | INTRAVENOUS | Status: AC
  Administered 2021-11-28: 500 [IU] via INTRAVENOUS
  Filled 2021-11-28: qty 5

## 2021-11-28 MED ORDER — ACETAMINOPHEN 325 MG PO TABS: 650.0000 mg | ORAL_TABLET | Freq: Four times a day (QID) | ORAL | Status: DC | PRN

## 2021-11-28 NOTE — Progress Notes (Signed)
Pt here for IVIG. Pt last received IVIG on 11/09/2021. Per MD, no IVIG today and reschedule for mid-June. Port deaccessed

## 2021-11-29 DIAGNOSIS — D631 Anemia in chronic kidney disease: Secondary | ICD-10-CM | POA: Diagnosis not present

## 2021-11-29 DIAGNOSIS — I10 Essential (primary) hypertension: Secondary | ICD-10-CM | POA: Diagnosis not present

## 2021-11-29 DIAGNOSIS — E1122 Type 2 diabetes mellitus with diabetic chronic kidney disease: Secondary | ICD-10-CM | POA: Diagnosis not present

## 2021-11-29 DIAGNOSIS — N1831 Chronic kidney disease, stage 3a: Secondary | ICD-10-CM | POA: Diagnosis not present

## 2021-11-30 ENCOUNTER — Telehealth: Payer: Self-pay

## 2021-11-30 DIAGNOSIS — D696 Thrombocytopenia, unspecified: Secondary | ICD-10-CM | POA: Diagnosis not present

## 2021-11-30 DIAGNOSIS — M47816 Spondylosis without myelopathy or radiculopathy, lumbar region: Secondary | ICD-10-CM | POA: Diagnosis not present

## 2021-11-30 LAB — MULTIPLE MYELOMA PANEL, SERUM
Albumin SerPl Elph-Mcnc: 3.9 g/dL (ref 2.9–4.4)
Albumin/Glob SerPl: 1.4 (ref 0.7–1.7)
Alpha 1: 0.3 g/dL (ref 0.0–0.4)
Alpha2 Glob SerPl Elph-Mcnc: 0.9 g/dL (ref 0.4–1.0)
B-Globulin SerPl Elph-Mcnc: 1 g/dL (ref 0.7–1.3)
Gamma Glob SerPl Elph-Mcnc: 0.7 g/dL (ref 0.4–1.8)
Globulin, Total: 2.8 g/dL (ref 2.2–3.9)
IgA: 18 mg/dL — ABNORMAL LOW (ref 87–352)
IgG (Immunoglobin G), Serum: 723 mg/dL (ref 586–1602)
IgM (Immunoglobulin M), Srm: 7 mg/dL — ABNORMAL LOW (ref 26–217)
Total Protein ELP: 6.7 g/dL (ref 6.0–8.5)

## 2021-11-30 NOTE — Telephone Encounter (Signed)
patient gets IVIG monthly. Last IVIG on 5/12.  IVIG has been moved from 12/26/21 to 12/05/11. MD aware.

## 2021-12-01 ENCOUNTER — Inpatient Hospital Stay: Payer: Medicare HMO

## 2021-12-03 ENCOUNTER — Inpatient Hospital Stay: Payer: Medicare HMO | Attending: Oncology

## 2021-12-03 ENCOUNTER — Inpatient Hospital Stay: Payer: Medicare HMO

## 2021-12-03 VITALS — BP 129/70 | HR 68 | Temp 99.1°F | Resp 18

## 2021-12-03 DIAGNOSIS — E538 Deficiency of other specified B group vitamins: Secondary | ICD-10-CM | POA: Insufficient documentation

## 2021-12-03 DIAGNOSIS — I110 Hypertensive heart disease with heart failure: Secondary | ICD-10-CM | POA: Diagnosis not present

## 2021-12-03 DIAGNOSIS — Z841 Family history of disorders of kidney and ureter: Secondary | ICD-10-CM | POA: Insufficient documentation

## 2021-12-03 DIAGNOSIS — Z9049 Acquired absence of other specified parts of digestive tract: Secondary | ICD-10-CM | POA: Insufficient documentation

## 2021-12-03 DIAGNOSIS — M549 Dorsalgia, unspecified: Secondary | ICD-10-CM | POA: Diagnosis not present

## 2021-12-03 DIAGNOSIS — C9001 Multiple myeloma in remission: Secondary | ICD-10-CM | POA: Insufficient documentation

## 2021-12-03 DIAGNOSIS — R110 Nausea: Secondary | ICD-10-CM | POA: Insufficient documentation

## 2021-12-03 DIAGNOSIS — Z8249 Family history of ischemic heart disease and other diseases of the circulatory system: Secondary | ICD-10-CM | POA: Insufficient documentation

## 2021-12-03 DIAGNOSIS — C9000 Multiple myeloma not having achieved remission: Secondary | ICD-10-CM

## 2021-12-03 DIAGNOSIS — D801 Nonfamilial hypogammaglobulinemia: Secondary | ICD-10-CM | POA: Diagnosis not present

## 2021-12-03 DIAGNOSIS — Z79899 Other long term (current) drug therapy: Secondary | ICD-10-CM | POA: Diagnosis not present

## 2021-12-03 DIAGNOSIS — Z886 Allergy status to analgesic agent status: Secondary | ICD-10-CM | POA: Diagnosis not present

## 2021-12-03 DIAGNOSIS — Z803 Family history of malignant neoplasm of breast: Secondary | ICD-10-CM | POA: Insufficient documentation

## 2021-12-03 DIAGNOSIS — Z9484 Stem cells transplant status: Secondary | ICD-10-CM | POA: Diagnosis not present

## 2021-12-03 DIAGNOSIS — T451X5A Adverse effect of antineoplastic and immunosuppressive drugs, initial encounter: Secondary | ICD-10-CM | POA: Insufficient documentation

## 2021-12-03 DIAGNOSIS — Z888 Allergy status to other drugs, medicaments and biological substances status: Secondary | ICD-10-CM | POA: Diagnosis not present

## 2021-12-03 DIAGNOSIS — Z885 Allergy status to narcotic agent status: Secondary | ICD-10-CM | POA: Insufficient documentation

## 2021-12-03 DIAGNOSIS — I5032 Chronic diastolic (congestive) heart failure: Secondary | ICD-10-CM | POA: Insufficient documentation

## 2021-12-03 DIAGNOSIS — I350 Nonrheumatic aortic (valve) stenosis: Secondary | ICD-10-CM | POA: Insufficient documentation

## 2021-12-03 DIAGNOSIS — M2550 Pain in unspecified joint: Secondary | ICD-10-CM | POA: Diagnosis not present

## 2021-12-03 DIAGNOSIS — G620 Drug-induced polyneuropathy: Secondary | ICD-10-CM | POA: Insufficient documentation

## 2021-12-03 DIAGNOSIS — Z9481 Bone marrow transplant status: Secondary | ICD-10-CM | POA: Diagnosis not present

## 2021-12-03 DIAGNOSIS — Z8379 Family history of other diseases of the digestive system: Secondary | ICD-10-CM | POA: Insufficient documentation

## 2021-12-03 DIAGNOSIS — F32A Depression, unspecified: Secondary | ICD-10-CM | POA: Insufficient documentation

## 2021-12-03 DIAGNOSIS — Z833 Family history of diabetes mellitus: Secondary | ICD-10-CM | POA: Insufficient documentation

## 2021-12-03 DIAGNOSIS — Z832 Family history of diseases of the blood and blood-forming organs and certain disorders involving the immune mechanism: Secondary | ICD-10-CM | POA: Insufficient documentation

## 2021-12-03 DIAGNOSIS — R197 Diarrhea, unspecified: Secondary | ICD-10-CM | POA: Insufficient documentation

## 2021-12-03 DIAGNOSIS — Z800 Family history of malignant neoplasm of digestive organs: Secondary | ICD-10-CM | POA: Insufficient documentation

## 2021-12-03 DIAGNOSIS — Z801 Family history of malignant neoplasm of trachea, bronchus and lung: Secondary | ICD-10-CM | POA: Insufficient documentation

## 2021-12-03 DIAGNOSIS — R1310 Dysphagia, unspecified: Secondary | ICD-10-CM | POA: Diagnosis not present

## 2021-12-03 DIAGNOSIS — Z822 Family history of deafness and hearing loss: Secondary | ICD-10-CM | POA: Insufficient documentation

## 2021-12-03 DIAGNOSIS — Z87891 Personal history of nicotine dependence: Secondary | ICD-10-CM | POA: Diagnosis not present

## 2021-12-03 DIAGNOSIS — Z808 Family history of malignant neoplasm of other organs or systems: Secondary | ICD-10-CM | POA: Insufficient documentation

## 2021-12-03 LAB — MAGNESIUM: Magnesium: 1.2 mg/dL — ABNORMAL LOW (ref 1.7–2.4)

## 2021-12-03 MED ORDER — SODIUM CHLORIDE 0.9% FLUSH
10.0000 mL | Freq: Once | INTRAVENOUS | Status: AC | PRN
  Administered 2021-12-03: 10 mL
  Filled 2021-12-03: qty 10

## 2021-12-03 MED ORDER — SODIUM CHLORIDE 0.9 % IV SOLN
Freq: Once | INTRAVENOUS | Status: AC
  Filled 2021-12-03: qty 250

## 2021-12-03 MED ORDER — SODIUM CHLORIDE 0.9 % IV SOLN: 6.0000 g | Freq: Once | INTRAVENOUS | Status: DC

## 2021-12-03 MED ORDER — HEPARIN SOD (PORK) LOCK FLUSH 100 UNIT/ML IV SOLN
500.0000 [IU] | Freq: Once | INTRAVENOUS | Status: AC | PRN
  Administered 2021-12-03: 500 [IU]
  Filled 2021-12-03: qty 5

## 2021-12-03 MED ORDER — MAGNESIUM SULFATE 2 GM/50ML IV SOLN
2.0000 g | Freq: Once | INTRAVENOUS | Status: AC
  Administered 2021-12-03: 2 g via INTRAVENOUS
  Filled 2021-12-03: qty 50

## 2021-12-03 MED ORDER — SODIUM CHLORIDE 0.9 % IV SOLN
25.0000 mg | Freq: Once | INTRAVENOUS | Status: AC
  Administered 2021-12-03: 25 mg via INTRAVENOUS
  Filled 2021-12-03: qty 1

## 2021-12-03 MED ORDER — MAGNESIUM SULFATE 4 GM/100ML IV SOLN
4.0000 g | Freq: Once | INTRAVENOUS | Status: AC
  Administered 2021-12-03: 4 g via INTRAVENOUS
  Filled 2021-12-03: qty 100

## 2021-12-03 NOTE — Patient Instructions (Signed)
Magnesium Sulfate Injection What is this medication? MAGNESIUM SULFATE (mag NEE zee um SUL fate) prevents and treats low levels of magnesium in your body. It may also be used to prevent and treat seizures during pregnancy in people with high blood pressure disorders, such as preeclampsia or eclampsia. Magnesium plays an important role in maintaining the health of your muscles and nervous system. This medicine may be used for other purposes; ask your health care provider or pharmacist if you have questions. What should I tell my care team before I take this medication? They need to know if you have any of these conditions: Heart disease History of irregular heart beat Kidney disease An unusual or allergic reaction to magnesium sulfate, medications, foods, dyes, or preservatives Pregnant or trying to get pregnant Breast-feeding How should I use this medication? This medication is for infusion into a vein. It is given in a hospital or clinic setting. Talk to your care team about the use of this medication in children. While this medication may be prescribed for selected conditions, precautions do apply. Overdosage: If you think you have taken too much of this medicine contact a poison control center or emergency room at once. NOTE: This medicine is only for you. Do not share this medicine with others. What if I miss a dose? This does not apply. What may interact with this medication? Certain medications for anxiety or sleep Certain medications for seizures like phenobarbital Digoxin Medications that relax muscles for surgery Narcotic medications for pain This list may not describe all possible interactions. Give your health care provider a list of all the medicines, herbs, non-prescription drugs, or dietary supplements you use. Also tell them if you smoke, drink alcohol, or use illegal drugs. Some items may interact with your medicine. What should I watch for while using this medication? Your  condition will be monitored carefully while you are receiving this medication. You may need blood work done while you are receiving this medication. What side effects may I notice from receiving this medication? Side effects that you should report to your care team as soon as possible: Allergic reactions--skin rash, itching, hives, swelling of the face, lips, tongue, or throat High magnesium level--confusion, drowsiness, facial flushing, redness, sweating, muscle weakness, fast or irregular heartbeat, trouble breathing Low blood pressure--dizziness, feeling faint or lightheaded, blurry vision Side effects that usually do not require medical attention (report to your care team if they continue or are bothersome): Headache Nausea This list may not describe all possible side effects. Call your doctor for medical advice about side effects. You may report side effects to FDA at 1-800-FDA-1088. Where should I keep my medication? This medication is given in a hospital or clinic and will not be stored at home. NOTE: This sheet is a summary. It may not cover all possible information. If you have questions about this medicine, talk to your doctor, pharmacist, or health care provider.  2023 Elsevier/Gold Standard (2020-08-31 00:00:00)  

## 2021-12-03 NOTE — Progress Notes (Signed)
Pt had nerve block with Dr Sharlet Salina on 11/30/21. Pain improved in back , numbness continues in her foot. Magnesium 1.2. 6 grams of magnesium given IV today.

## 2021-12-05 ENCOUNTER — Inpatient Hospital Stay: Payer: Medicare HMO

## 2021-12-07 ENCOUNTER — Encounter: Payer: Self-pay | Admitting: Internal Medicine

## 2021-12-07 ENCOUNTER — Ambulatory Visit (INDEPENDENT_AMBULATORY_CARE_PROVIDER_SITE_OTHER): Payer: Medicare HMO | Admitting: Internal Medicine

## 2021-12-07 VITALS — BP 128/70 | HR 84 | Ht 62.0 in | Wt 157.0 lb

## 2021-12-07 DIAGNOSIS — E041 Nontoxic single thyroid nodule: Secondary | ICD-10-CM

## 2021-12-07 DIAGNOSIS — Z0000 Encounter for general adult medical examination without abnormal findings: Secondary | ICD-10-CM | POA: Diagnosis not present

## 2021-12-07 DIAGNOSIS — E785 Hyperlipidemia, unspecified: Secondary | ICD-10-CM | POA: Diagnosis not present

## 2021-12-07 DIAGNOSIS — E559 Vitamin D deficiency, unspecified: Secondary | ICD-10-CM

## 2021-12-07 DIAGNOSIS — M47816 Spondylosis without myelopathy or radiculopathy, lumbar region: Secondary | ICD-10-CM

## 2021-12-07 DIAGNOSIS — F324 Major depressive disorder, single episode, in partial remission: Secondary | ICD-10-CM | POA: Diagnosis not present

## 2021-12-07 DIAGNOSIS — E118 Type 2 diabetes mellitus with unspecified complications: Secondary | ICD-10-CM | POA: Diagnosis not present

## 2021-12-07 DIAGNOSIS — E1169 Type 2 diabetes mellitus with other specified complication: Secondary | ICD-10-CM

## 2021-12-07 DIAGNOSIS — Z1231 Encounter for screening mammogram for malignant neoplasm of breast: Secondary | ICD-10-CM

## 2021-12-07 DIAGNOSIS — I10 Essential (primary) hypertension: Secondary | ICD-10-CM | POA: Diagnosis not present

## 2021-12-07 MED ORDER — TIZANIDINE HCL 4 MG PO TABS: 4.0000 mg | ORAL_TABLET | Freq: Four times a day (QID) | ORAL | 0 refills | Status: DC | PRN

## 2021-12-07 MED ORDER — DULOXETINE HCL 60 MG PO CPEP: 60.0000 mg | ORAL_CAPSULE | Freq: Every day | ORAL | 0 refills | Status: DC

## 2021-12-07 NOTE — Progress Notes (Signed)
Date:  12/07/2021   Name:  Sarah [name]   DOB:  1956/02/15   MRN:  [id number]   Chief Complaint: Annual Exam Sarah [name] is a 66 y.o. female who presents today for her Complete Annual Exam. She feels well. She reports exercising - some. She reports she is sleeping poorly. Breast complaints - none.  Mammogram: 11/2019 DEXA: none Pap smear: discontinued Colonoscopy: 11/2020  Health Maintenance Due  Topic Date Due   OPHTHALMOLOGY EXAM  07/26/2020   URINE MICROALBUMIN  11/29/2020   COVID-19 Vaccine (4 - Booster for Pfizer series) 03/06/2021   DEXA SCAN  Never done   MAMMOGRAM  12/06/2021   FOOT EXAM  12/05/2021    Immunization History  Administered Date(s) Administered   DTaP / Hep B / IPV 11/14/2020   Fluad Quad(high Dose 65+) 03/13/2021   Hepatitis B, adult 07/09/2016, 09/10/2016, 07/08/2017   HiB (PRP-T) 07/09/2016, 09/10/2016, 07/08/2017, 11/14/2020   IPV 07/09/2016, 09/10/2016, 07/08/2017   Influenza,inj,Quad PF,6+ Mos 03/23/2015, 06/18/2016, 04/08/2017, 06/30/2017, 03/31/2018, 04/12/2019, 04/03/2020   MMR 07/08/2017   PFIZER(Purple Top)SARS-COV-2 Vaccination 09/20/2019, 10/09/2019, 01/09/2021   Pneumococcal Conjugate-13 03/23/2015, 07/09/2016, 01/07/2017, 03/11/2017, 11/14/2020   Pneumococcal Polysaccharide-23 07/08/2017   Td 08/20/2020   Tdap 07/09/2016, 09/10/2016, 02/12/2018   Zoster Recombinat (Shingrix) 11/19/2017, 02/12/2018    Hypertension This is a chronic problem. The problem is controlled. Pertinent negatives include no chest pain, headaches, palpitations or shortness of breath. Past treatments include beta blockers. The current treatment provides significant improvement. Hypertensive end-organ damage includes kidney disease and CAD/MI. Identifiable causes of hypertension include a thyroid problem.  Diabetes She presents for her follow-up diabetic visit. She has type 2 diabetes mellitus. Pertinent negatives for hypoglycemia include no dizziness,  headaches, nervousness/anxiousness or tremors. Pertinent negatives for diabetes include no chest pain, no fatigue, no polydipsia and no polyuria. Current diabetic treatment includes oral agent (monotherapy) (glipizide). An ACE inhibitor/angiotensin II receptor blocker is not being taken. Eye exam is not current.  Hyperlipidemia This is a chronic problem. Recent lipid tests were reviewed and are low (HDL). Pertinent negatives include no chest pain or shortness of breath.  Thyroid Problem Presents for follow-up visit. Patient reports no anxiety, constipation, diarrhea, fatigue, palpitations or tremors. The symptoms have been stable. Her past medical history is significant for hyperlipidemia.  Depression        This is a chronic problem.  The problem has been gradually worsening since onset.  Associated symptoms include helplessness, insomnia, irritable, decreased interest and sad.  Associated symptoms include no fatigue and no headaches.  Past treatments include SSRIs - Selective serotonin reuptake inhibitors.  Compliance with treatment is good.  Past medical history includes chronic pain, thyroid problem, chronic illness and physical disability.     Lab Results  Component Value Date   NA 136 11/27/2021   K 4.0 11/27/2021   CO2 25 11/27/2021   GLUCOSE 148 (H) 11/27/2021   BUN 23 11/27/2021   CREATININE 1.14 (H) 11/27/2021   CALCIUM 8.5 (L) 11/27/2021   GFRNONAA 53 (L) 11/27/2021   Lab Results  Component Value Date   CHOL 99 08/24/2021   HDL 40 (L) 08/24/2021   LDLCALC 44 08/24/2021   TRIG 77 08/24/2021   CHOLHDL 2.5 08/24/2021   Lab Results  Component Value Date   TSH 0.555 08/21/2021   Lab Results  Component Value Date   HGBA1C 7.5 (H) 08/21/2021   Lab Results  Component Value Date   WBC 5.9 11/27/2021   HGB 11.0 (L)  11/27/2021   HCT 33.9 (L) 11/27/2021   MCV 80.7 11/27/2021   PLT 108 (L) 11/27/2021   Lab Results  Component Value Date   ALT 48 (H) 11/27/2021   AST 44 (H)  11/27/2021   ALKPHOS 89 11/27/2021   BILITOT 0.6 11/27/2021   Lab Results  Component Value Date   VD25OH 29.8 (L) 04/09/2017     Review of Systems  Constitutional:  Negative for chills, fatigue and fever.  HENT:  Negative for congestion, hearing loss, tinnitus, trouble swallowing and voice change.   Eyes:  Negative for visual disturbance.  Respiratory:  Negative for cough, chest tightness, shortness of breath and wheezing.   Cardiovascular:  Negative for chest pain, palpitations and leg swelling.  Gastrointestinal:  Negative for abdominal pain, constipation, diarrhea and vomiting.  Endocrine: Negative for polydipsia and polyuria.  Genitourinary:  Negative for dysuria, frequency, genital sores, vaginal bleeding and vaginal discharge.  Musculoskeletal:  Positive for arthralgias and back pain. Negative for gait problem and joint swelling.  Skin:  Negative for color change and rash.  Neurological:  Positive for numbness (and pain in both feet). Negative for dizziness, tremors, light-headedness and headaches.  Hematological:  Negative for adenopathy. Does not bruise/bleed easily.  Psychiatric/Behavioral:  Positive for depression. Negative for dysphoric mood and sleep disturbance. The patient has insomnia. The patient is not nervous/anxious.     Patient Active Problem List   Diagnosis Date Noted   Complete left bundle branch block 09/06/2021   Anemia of chronic renal failure, stage 3a ([place]) 09/06/2021   Gastroesophageal reflux disease without esophagitis 09/06/2021   Cardiomyopathy ([place])    Acute HFrEF (heart failure with reduced ejection fraction) ([place]) 08/22/2021   NSTEMI (non-ST elevated myocardial infarction) ([place]) 08/21/2021   Low immunoglobulin level    Bronchitis    Fever    Acute gingivitis 06/24/2021   URI (upper respiratory infection) 06/23/2021   Chronic diarrhea 06/23/2021   Multiple myeloma in remission ([place]) 05/14/2021   Leg swelling 04/02/2021   Neuropathy 04/02/2021    Fever in adult 03/05/2021   Frequent PVCs 12/04/2020   Diarrhea 10/25/2020   Encounter for antineoplastic chemotherapy 08/31/2020   Coronary artery disease involving native coronary artery of native heart 04/26/2020   Yeast vaginitis 03/18/2020   Hypokalemia 03/11/2020   UTI (urinary tract infection) 03/03/2020   Dyspnea 03/02/2020   Physical deconditioning 02/07/2020   Impaired nutrition 02/07/2020   Sepsis ([place]) 11/13/2019   Encounter for antineoplastic immunotherapy 10/03/2019   Hypocalcemia 08/28/2019   Chronic nausea 12/17/2018   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/17/2018   Hyperlipidemia associated with type 2 diabetes mellitus ([place]) 11/25/2018   Anemia 10/21/2018   Hypomagnesemia 08/20/2018   Goals of care, counseling/discussion 08/20/2018   B12 deficiency 08/20/2018   Thrombocytopenia ([place]) 02/09/2018   Benign essential HTN 08/21/2017   Carotid artery stenosis, asymptomatic, bilateral 08/06/2017   Thyroid nodule 08/06/2017   Iron deficiency anemia due to chronic blood loss 05/13/2017   Spinal stenosis of lumbar region with neurogenic claudication 04/09/2017   Long term prescription opiate use 09/07/2016   Chronic pain syndrome 07/08/2016   Pain medication agreement signed 07/08/2016   Spondylosis of lumbar region without myelopathy or radiculopathy 07/08/2016   Nonrheumatic aortic valve stenosis 06/05/2016   Environmental and seasonal allergies 04/19/2016   Insomnia 04/19/2016   Asthma 04/19/2016   Aortic regurgitation 04/19/2016   Type II diabetes mellitus with complication ([place]) 03/00/9233   Depression, major, single episode, in partial remission ([place]) 04/12/2016  Irritable bowel syndrome (IBS) 04/12/2016   Hepatic steatosis 04/12/2016   Multiple myeloma not having achieved remission ([place]) 04/02/2016   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[place]) 07/06/2015   Stem cell transplant candidate 05/16/2015    Allergies  Allergen Reactions    Oxycodone-Acetaminophen Anaphylaxis    Swelling and rash   Celebrex [Celecoxib] Diarrhea   Codeine    Plerixafor     In 2016 during ASCT collection patient developed fever to 103.51F and required hospitalization   Benadryl [Diphenhydramine] Palpitations   Morphine Itching and Rash   Ondansetron Diarrhea   Tylenol [Acetaminophen] Itching and Rash    Past Surgical History:  Procedure Laterality Date   ABDOMINAL HYSTERECTOMY     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   CARDIAC ELECTROPHYSIOLOGY MAPPING AND ABLATION     CARPAL TUNNEL RELEASE Bilateral    CHOLECYSTECTOMY  2008   COLONOSCOPY WITH PROPOFOL N/A 05/07/2017   Procedure: COLONOSCOPY WITH PROPOFOL;  Surgeon: [name], MD;  Location[facility] ENDOSCOPY;  Service: Gastroenterology;  Laterality: N/A;   COLONOSCOPY WITH PROPOFOL N/A 12/19/2020   Procedure: COLONOSCOPY WITH PROPOFOL;  Surgeon: [name], MD;  Location: [facility] ENDOSCOPY;  Service: Gastroenterology;  Laterality: N/A;   ESOPHAGOGASTRODUODENOSCOPY (EGD) WITH PROPOFOL N/A 05/07/2017   Procedure: ESOPHAGOGASTRODUODENOSCOPY (EGD) WITH PROPOFOL;  Surgeon: [name], MD;  Location[facility] ENDOSCOPY;  Service: Gastroenterology;  Laterality: N/A;   FOOT SURGERY Bilateral    INCONTINENCE SURGERY  2009   INTERSTIM IMPLANT PLACEMENT     other     over active bladder   OTHER SURGICAL HISTORY     bladder stimulator    PARTIAL HYSTERECTOMY  03/1996   fibroids   PORTA CATH INSERTION N/A 03/10/2019   Procedure: PORTA CATH INSERTION;  Surgeon: Algernon Huxley, MD;  Location: [facility] [facility] LAB;  Service: Cardiovascular;  Laterality: N/A;   TONSILLECTOMY  2007    Social History   Tobacco Use   Smoking status: Former    Packs/day: 1.00    Years: 20.00    Total pack years: 20.00    Types: Cigarettes    Quit date: 07/02/1991    Years since quitting: 30.4   Smokeless tobacco: Never  Vaping Use   Vaping Use: Never used  Substance Use Topics   Alcohol use: Yes    Comment: occasionally    Drug use: No     Medication list has been reviewed and updated.  No outpatient medications have been marked as taking for the 12/07/21 encounter (Office Visit) with Glean Hess, MD.       12/07/2021    9:32 AM 06/22/2021    2:03 PM 05/28/2021    4:26 PM 03/13/2021    2:08 PM  GAD 7 : Generalized Anxiety Score  Nervous, Anxious, on Edge 1 2 1 2   Control/stop worrying 0 1 0 1  Worry too much - different things 0 1 1 1   Trouble relaxing 1 2 1 2   Restless 1 2 1 2   Easily annoyed or irritable 0 0 1 0  Afraid - awful might happen 0 0 0 0  Total GAD 7 Score 3 8 5 8   Anxiety Difficulty Not difficult at all Not difficult at all Not difficult at all        12/07/2021    9:32 AM  Depression screen PHQ 2/9  Decreased Interest 2  Down, Depressed, Hopeless 2  PHQ - 2 Score 4  Altered sleeping 2  Tired, decreased energy 0  Change in appetite 0  Feeling bad or failure about yourself  2  Trouble concentrating 0  Moving slowly or fidgety/restless 0  Suicidal thoughts 0  PHQ-9 Score 8    BP Readings from Last 3 Encounters:  12/07/21 128/70  12/03/21 129/70  11/27/21 129/63    Physical Exam Vitals and nursing note reviewed.  Constitutional:      General: She is irritable. She is not in acute distress.    Appearance: She is well-developed.  HENT:     Head: Normocephalic and atraumatic.     Right Ear: Tympanic membrane and ear canal normal.     Left Ear: Tympanic membrane and ear canal normal.     Nose:     Right Sinus: No maxillary sinus tenderness.     Left Sinus: No maxillary sinus tenderness.  Eyes:     General: No scleral icterus.       Right eye: No discharge.        Left eye: No discharge.     Conjunctiva/sclera: Conjunctivae normal.  Neck:     Thyroid: No thyromegaly.     Vascular: No carotid bruit.  Cardiovascular:     Rate and Rhythm: Normal rate and regular rhythm.     Pulses: Normal pulses.     Heart sounds: Normal heart sounds.  Pulmonary:      Effort: Pulmonary effort is normal. No respiratory distress.     Breath sounds: No wheezing.  Chest:  Breasts:    Right: No mass, nipple discharge, skin change or tenderness.     Left: No mass, nipple discharge, skin change or tenderness.  Abdominal:     General: Bowel sounds are normal.     Palpations: Abdomen is soft.     Tenderness: There is no abdominal tenderness.  Musculoskeletal:        General: Normal range of motion.     Cervical back: Normal range of motion. No erythema.     Right lower leg: No edema.     Left lower leg: No edema.  Lymphadenopathy:     Cervical: No cervical adenopathy.  Skin:    General: Skin is warm and dry.     Capillary Refill: Capillary refill takes less than 2 seconds.     Findings: No rash.  Neurological:     General: No focal deficit present.     Mental Status: She is alert and oriented to person, place, and time.     Cranial Nerves: No cranial nerve deficit.     Sensory: No sensory deficit.     Deep Tendon Reflexes: Reflexes are normal and symmetric.  Psychiatric:        Attention and Perception: Attention normal.        Mood and Affect: Mood normal.        Speech: Speech normal.        Thought Content: Thought content normal.        Cognition and Memory: Cognition normal.     Wt Readings from Last 3 Encounters:  12/07/21 157 lb (71.2 kg)  11/27/21 158 lb 8 oz (71.9 kg)  11/09/21 154 lb 10.4 oz (70.1 kg)    BP 128/70   Pulse 84   Ht 5' 2"  (1.575 m)   Wt 157 lb (71.2 kg)   SpO2 98%   BMI 28.72 kg/m   Assessment and Plan: 1. Annual physical exam Up to date on  immunizations. Continue to work on healthy diet; exercise if able  2.  Encounter for screening mammogram for breast cancer Schedule at [facility] - MM 3D SCREEN BREAST BILATERAL  3. Benign essential HTN Clinically stable exam with well controlled BP. Tolerating medications without side effects at this time. Pt to continue current regimen and low sodium diet; benefits of  regular exercise as able discussed.  4. Type II diabetes mellitus with complication ([place]) Clinically stable by exam and report without s/s of hypoglycemia. DM complicated by hypertension and dyslipidemia. Tolerating medications well without side effects or other concerns. - Hemoglobin A1c - Microalbumin / creatinine urine ratio  5. Hyperlipidemia associated with type 2 diabetes mellitus ([facility]) Check labs - Lipid panel  6. Depression, major, single episode, in partial remission ([place]) Some increase in symptoms lately despite Prozac. Will add Cymbalta to help with depression and pain - DULoxetine (CYMBALTA) 60 MG capsule; Take 1 capsule (60 mg total) by mouth daily.  Dispense: 90 capsule; Refill: 0  7. Vitamin D deficiency Check labs and advise - VITAMIN D 25 Hydroxy (Vit-D Deficiency, Fractures)  8. Thyroid nodule - TSH + free T4  9. Spondylosis of lumbar region without myelopathy or radiculopathy Start Cymbalta Taking tizanidine without side effects currently despite potential interaction with Acyclovir. - tiZANidine (ZANAFLEX) 4 MG tablet; Take 1 tablet (4 mg total) by mouth every 6 (six) hours as needed for muscle spasms.  Dispense: 90 tablet; Refill: 0   Partially dictated using Editor, commissioning. Any errors are unintentional.  Halina Maidens, MD [facility]  12/07/2021

## 2021-12-09 LAB — LIPID PANEL
Chol/HDL Ratio: 2.9 ratio (ref 0.0–4.4)
Cholesterol, Total: 171 mg/dL (ref 100–199)
HDL: 59 mg/dL (ref >39)
LDL Chol Calc (NIH): 98 mg/dL (ref 0–99)
Triglycerides: 76 mg/dL (ref 0–149)
VLDL Cholesterol Cal: 14 mg/dL (ref 5–40)

## 2021-12-09 LAB — MICROALBUMIN / CREATININE URINE RATIO
Creatinine, Urine: 115.3 mg/dL
Microalb/Creat Ratio: 205 mg/g creat — ABNORMAL HIGH (ref 0–29)
Microalbumin, Urine: 236.9 ug/mL

## 2021-12-09 LAB — VITAMIN D 25 HYDROXY (VIT D DEFICIENCY, FRACTURES): Vit D, 25-Hydroxy: 26.1 ng/mL — ABNORMAL LOW (ref 30.0–100.0)

## 2021-12-09 LAB — HEMOGLOBIN A1C
Est. average glucose Bld gHb Est-mCnc: 151 mg/dL
Hgb A1c MFr Bld: 6.9 % — ABNORMAL HIGH (ref 4.8–5.6)

## 2021-12-09 LAB — TSH+FREE T4
Free T4: 1.17 ng/dL (ref 0.82–1.77)
TSH: 2.28 u[IU]/mL (ref 0.450–4.500)

## 2021-12-10 ENCOUNTER — Inpatient Hospital Stay: Payer: Medicare HMO

## 2021-12-10 DIAGNOSIS — R197 Diarrhea, unspecified: Secondary | ICD-10-CM | POA: Diagnosis not present

## 2021-12-10 DIAGNOSIS — R110 Nausea: Secondary | ICD-10-CM | POA: Diagnosis not present

## 2021-12-10 DIAGNOSIS — C9001 Multiple myeloma in remission: Secondary | ICD-10-CM | POA: Diagnosis not present

## 2021-12-10 DIAGNOSIS — E538 Deficiency of other specified B group vitamins: Secondary | ICD-10-CM | POA: Diagnosis not present

## 2021-12-10 DIAGNOSIS — C9000 Multiple myeloma not having achieved remission: Secondary | ICD-10-CM

## 2021-12-10 DIAGNOSIS — M2550 Pain in unspecified joint: Secondary | ICD-10-CM | POA: Diagnosis not present

## 2021-12-10 DIAGNOSIS — G620 Drug-induced polyneuropathy: Secondary | ICD-10-CM | POA: Diagnosis not present

## 2021-12-10 DIAGNOSIS — M549 Dorsalgia, unspecified: Secondary | ICD-10-CM | POA: Diagnosis not present

## 2021-12-10 DIAGNOSIS — D801 Nonfamilial hypogammaglobulinemia: Secondary | ICD-10-CM | POA: Diagnosis not present

## 2021-12-10 LAB — MAGNESIUM: Magnesium: 1.2 mg/dL — ABNORMAL LOW (ref 1.7–2.4)

## 2021-12-10 MED ORDER — MAGNESIUM SULFATE 4 GM/100ML IV SOLN
4.0000 g | Freq: Once | INTRAVENOUS | Status: AC
  Administered 2021-12-10: 4 g via INTRAVENOUS
  Filled 2021-12-10: qty 100

## 2021-12-10 MED ORDER — SODIUM CHLORIDE 0.9 % IV SOLN: 6.0000 g | Freq: Once | INTRAVENOUS | Status: DC

## 2021-12-10 MED ORDER — SODIUM CHLORIDE 0.9 % IV SOLN
Freq: Once | INTRAVENOUS | Status: AC
  Filled 2021-12-10: qty 250

## 2021-12-10 MED ORDER — MAGNESIUM SULFATE 2 GM/50ML IV SOLN
2.0000 g | Freq: Once | INTRAVENOUS | Status: AC
  Administered 2021-12-10: 2 g via INTRAVENOUS
  Filled 2021-12-10: qty 50

## 2021-12-10 MED ORDER — SODIUM CHLORIDE 0.9 % IV SOLN
25.0000 mg | Freq: Once | INTRAVENOUS | Status: AC
  Administered 2021-12-10: 25 mg via INTRAVENOUS
  Filled 2021-12-10: qty 1

## 2021-12-10 MED ORDER — SODIUM CHLORIDE 0.9% FLUSH
10.0000 mL | Freq: Once | INTRAVENOUS | Status: AC | PRN
  Administered 2021-12-10: 10 mL
  Filled 2021-12-10: qty 10

## 2021-12-10 MED ORDER — HEPARIN SOD (PORK) LOCK FLUSH 100 UNIT/ML IV SOLN
500.0000 [IU] | Freq: Once | INTRAVENOUS | Status: AC | PRN
  Administered 2021-12-10: 500 [IU]
  Filled 2021-12-10: qty 5

## 2021-12-11 ENCOUNTER — Inpatient Hospital Stay: Payer: Medicare HMO

## 2021-12-11 ENCOUNTER — Other Ambulatory Visit: Payer: Self-pay | Admitting: Oncology

## 2021-12-11 VITALS — BP 121/60 | HR 70 | Temp 96.2°F | Resp 18

## 2021-12-11 DIAGNOSIS — C9001 Multiple myeloma in remission: Secondary | ICD-10-CM | POA: Diagnosis not present

## 2021-12-11 DIAGNOSIS — R197 Diarrhea, unspecified: Secondary | ICD-10-CM | POA: Diagnosis not present

## 2021-12-11 DIAGNOSIS — G620 Drug-induced polyneuropathy: Secondary | ICD-10-CM | POA: Diagnosis not present

## 2021-12-11 DIAGNOSIS — D801 Nonfamilial hypogammaglobulinemia: Secondary | ICD-10-CM | POA: Diagnosis not present

## 2021-12-11 DIAGNOSIS — E538 Deficiency of other specified B group vitamins: Secondary | ICD-10-CM | POA: Diagnosis not present

## 2021-12-11 DIAGNOSIS — M2550 Pain in unspecified joint: Secondary | ICD-10-CM | POA: Diagnosis not present

## 2021-12-11 DIAGNOSIS — M549 Dorsalgia, unspecified: Secondary | ICD-10-CM | POA: Diagnosis not present

## 2021-12-11 DIAGNOSIS — R110 Nausea: Secondary | ICD-10-CM | POA: Diagnosis not present

## 2021-12-11 DIAGNOSIS — C9000 Multiple myeloma not having achieved remission: Secondary | ICD-10-CM

## 2021-12-11 MED ORDER — CYANOCOBALAMIN 1000 MCG/ML IJ SOLN
1000.0000 ug | Freq: Once | INTRAMUSCULAR | Status: AC
  Administered 2021-12-11: 1000 ug via INTRAMUSCULAR
  Filled 2021-12-11: qty 1

## 2021-12-11 MED ORDER — HEPARIN SOD (PORK) LOCK FLUSH 100 UNIT/ML IV SOLN
250.0000 [IU] | Freq: Once | INTRAVENOUS | Status: DC | PRN
  Filled 2021-12-11: qty 5

## 2021-12-11 MED ORDER — ACETAMINOPHEN 325 MG PO TABS
650.0000 mg | ORAL_TABLET | Freq: Four times a day (QID) | ORAL | Status: DC | PRN
  Administered 2021-12-11: 650 mg via ORAL
  Filled 2021-12-11: qty 2

## 2021-12-11 MED ORDER — DEXTROSE 5 % IV SOLN
INTRAVENOUS | Status: DC
  Filled 2021-12-11: qty 250

## 2021-12-11 MED ORDER — DIPHENHYDRAMINE HCL 25 MG PO CAPS
25.0000 mg | ORAL_CAPSULE | Freq: Four times a day (QID) | ORAL | Status: DC | PRN
  Administered 2021-12-11: 25 mg via ORAL
  Filled 2021-12-11: qty 1

## 2021-12-11 MED ORDER — IMMUNE GLOBULIN (HUMAN) 20 GM/200ML IV SOLN
30.0000 g | Freq: Once | INTRAVENOUS | Status: AC
  Administered 2021-12-11: 30 g via INTRAVENOUS
  Filled 2021-12-11: qty 200

## 2021-12-11 MED ORDER — HEPARIN SOD (PORK) LOCK FLUSH 100 UNIT/ML IV SOLN
500.0000 [IU] | Freq: Once | INTRAVENOUS | Status: AC
  Administered 2021-12-11: 500 [IU] via INTRAVENOUS
  Filled 2021-12-11: qty 5

## 2021-12-11 MED ORDER — SODIUM CHLORIDE 0.9 % IV SOLN
Freq: Once | INTRAVENOUS | Status: AC
  Filled 2021-12-11: qty 250

## 2021-12-11 NOTE — Patient Instructions (Signed)
[facility] CTR AT [place]  Discharge Instructions: Thank you for choosing [facility] to provide your oncology and hematology care.  If you have a lab appointment with the [facility], please go directly to the [facility] and check in at the registration area.  Wear comfortable clothing and clothing appropriate for easy access to any Portacath or PICC line.   We strive to give you quality time with your provider. You may need to reschedule your appointment if you arrive late (15 or more minutes).  Arriving late affects you and other patients whose appointments are after yours.  Also, if you miss three or more appointments without notifying the office, you may be dismissed from the clinic at the provider's discretion.      For prescription refill requests, have your pharmacy contact our office and allow 72 hours for refills to be completed.    Today you received the following chemotherapy and/or immunotherapy agents IVIG and Vit B12      To help prevent nausea and vomiting after your treatment, we encourage you to take your nausea medication as directed.  BELOW ARE SYMPTOMS THAT SHOULD BE REPORTED IMMEDIATELY: *FEVER GREATER THAN 100.4 F (38 C) OR HIGHER *CHILLS OR SWEATING *NAUSEA AND VOMITING THAT IS NOT CONTROLLED WITH YOUR NAUSEA MEDICATION *UNUSUAL SHORTNESS OF BREATH *UNUSUAL BRUISING OR BLEEDING *URINARY PROBLEMS (pain or burning when urinating, or frequent urination) *BOWEL PROBLEMS (unusual diarrhea, constipation, pain near the anus) TENDERNESS IN MOUTH AND THROAT WITH OR WITHOUT PRESENCE OF ULCERS (sore throat, sores in mouth, or a toothache) UNUSUAL RASH, SWELLING OR PAIN  UNUSUAL VAGINAL DISCHARGE OR ITCHING   Items with * indicate a potential emergency and should be followed up as soon as possible or go to the Emergency Department if any problems should occur.  Please show the CHEMOTHERAPY ALERT CARD or IMMUNOTHERAPY ALERT CARD at  check-in to the Emergency Department and triage nurse.  Should you have questions after your visit or need to cancel or reschedule your appointment, please contact [facility] CANCER [place] AT [place]  [phone] and follow the prompts.  Office hours are 8:00 a.m. to 4:30 p.m. Monday - Friday. Please note that voicemails left after 4:00 p.m. may not be returned until the following business day.  We are closed weekends and major holidays. You have access to a nurse at all times for urgent questions. Please call the main number to the clinic [phone] and follow the prompts.  For any non-urgent questions, you may also contact your provider using MyChart. We now offer e-Visits for anyone 39 and older to request care online for non-urgent symptoms. For details visit [email or web address].   Also download the MyChart app! Go to the app store, search "MyChart", open the app, select [place], and log in with your MyChart username and password.  Due to Covid, a mask is required upon entering the hospital/clinic. If you do not have a mask, one will be given to you upon arrival. For doctor visits, patients may have 1 support person aged 48 or older with them. For treatment visits, patients cannot have anyone with them due to current Covid guidelines and our immunocompromised population.   Immune Globulin Injection What is this medication? IMMUNE GLOBULIN (im MUNE  GLOB yoo lin) helps to prevent or reduce the severity of certain infections in patients who are at risk. This medicine is collected from the pooled blood of many donors. It is used to treat immune system problems, thrombocytopenia, and  Kawasaki syndrome. This medicine may be used for other purposes; ask your health care provider or pharmacist if you have questions. COMMON BRAND NAME(S): ASCENIV, Baygam, BIVIGAM, Carimune, Carimune NF, cutaquig, Cuvitru, Flebogamma, Flebogamma DIF, GamaSTAN, GamaSTAN S/D, Gamimune N, Gammagard,  Gammagard S/D, Gammaked, Gammaplex, Gammar-P IV, Gamunex, Gamunex-C, Hizentra, Iveegam, Iveegam EN, Octagam, Panglobulin, Panglobulin NF, panzyga, Polygam S/D, Privigen, Sandoglobulin, Venoglobulin-S, Vigam, Vivaglobulin, Xembify What should I tell my care team before I take this medication? They need to know if you have any of these conditions: diabetes extremely low or no immune antibodies in the blood heart disease history of blood clots hyperprolinemia infection in the blood, sepsis kidney disease recently received or scheduled to receive a vaccination an unusual or allergic reaction to human immune globulin, albumin, maltose, sucrose, other medicines, foods, dyes, or preservatives pregnant or trying to get pregnant breast-feeding How should I use this medication? This medicine is for injection into a muscle or infusion into a vein or skin. It is usually given by a health care professional in a hospital or clinic setting. In rare cases, some brands of this medicine might be given at home. You will be taught how to give this medicine. Use exactly as directed. Take your medicine at regular intervals. Do not take your medicine more often than directed. Talk to your pediatrician regarding the use of this medicine in children. While this drug may be prescribed for selected conditions, precautions do apply. Overdosage: If you think you have taken too much of this medicine contact a poison control center or emergency room at once. NOTE: This medicine is only for you. Do not share this medicine with others. What if I miss a dose? It is important not to miss your dose. Call your doctor or health care professional if you are unable to keep an appointment. If you give yourself the medicine and you miss a dose, take it as soon as you can. If it is almost time for your next dose, take only that dose. Do not take double or extra doses. What may interact with this medication? aspirin and aspirin-like  medicines cisplatin cyclosporine medicines for infection like acyclovir, adefovir, amphotericin B, bacitracin, cidofovir, foscarnet, ganciclovir, gentamicin, pentamidine, vancomycin NSAIDS, medicines for pain and inflammation, like ibuprofen or naproxen pamidronate vaccines zoledronic acid This list may not describe all possible interactions. Give your health care provider a list of all the medicines, herbs, non-prescription drugs, or dietary supplements you use. Also tell them if you smoke, drink alcohol, or use illegal drugs. Some items may interact with your medicine. What should I watch for while using this medication? Your condition will be monitored carefully while you are receiving this medicine. This medicine is made from pooled blood donations of many different people. It may be possible to pass an infection in this medicine. However, the donors are screened for infections and all products are tested for HIV and hepatitis. The medicine is treated to kill most or all bacteria and viruses. Talk to your doctor about the risks and benefits of this medicine. Do not have vaccinations for at least 14 days before, or until at least 3 months after receiving this medicine. What side effects may I notice from receiving this medication? Side effects that you should report to your doctor or health care professional as soon as possible: allergic reactions like skin rash, itching or hives, swelling of the face, lips, or tongue blue colored lips or skin breathing problems chest pain or tightness fever signs and symptoms  of aseptic meningitis such as stiff neck; sensitivity to light; headache; drowsiness; fever; nausea; vomiting; rash signs and symptoms of a blood clot such as chest pain; shortness of breath; pain, swelling, or warmth in the leg signs and symptoms of hemolytic anemia such as fast heartbeat; tiredness; dark yellow or brown urine; or yellowing of the eyes or skin signs and symptoms of  kidney injury like trouble passing urine or change in the amount of urine sudden weight gain swelling of the ankles, feet, hands Side effects that usually do not require medical attention (report to your doctor or health care professional if they continue or are bothersome): diarrhea flushing headache increased sweating joint pain muscle cramps muscle pain nausea pain, redness, or irritation at site where injected tiredness This list may not describe all possible side effects. Call your doctor for medical advice about side effects. You may report side effects to FDA at 1-800-FDA-1088. Where should I keep my medication? Keep out of the reach of children. This drug is usually given in a hospital or clinic and will not be stored at home. In rare cases, some brands of this medicine may be given at home. If you are using this medicine at home, you will be instructed on how to store this medicine. Throw away any unused medicine after the expiration date on the label. NOTE: This sheet is a summary. It may not cover all possible information. If you have questions about this medicine, talk to your doctor, pharmacist, or health care provider.  2023 Elsevier/Gold Standard (2019-01-20 00:00:00)  Vitamin B12 Injection What is this medication? Vitamin B12 (VAHY tuh min B12) prevents and treats low vitamin B12 levels in your body. It is used in people who do not get enough vitamin B12 from their diet or when their digestive tract does not absorb enough. Vitamin B12 plays an important role in maintaining the health of your nervous system and red blood cells. This medicine may be used for other purposes; ask your health care provider or pharmacist if you have questions. COMMON BRAND NAME(S): B-12 Compliance Kit, B-12 Injection Kit, Cyomin, Dodex, LA-12, Nutri-Twelve, Physicians EZ Use B-12, Primabalt What should I tell my care team before I take this medication? They need to know if you have any of these  conditions: Kidney disease Leber's disease Megaloblastic anemia An unusual or allergic reaction to cyanocobalamin, cobalt, other medications, foods, dyes, or preservatives Pregnant or trying to get pregnant Breast-feeding How should I use this medication? This medication is injected into a muscle or deeply under the skin. It is usually given in a clinic or care team's office. However, your care team may teach you how to inject yourself. Follow all instructions. Talk to your care team about the use of this medication in children. Special care may be needed. Overdosage: If you think you have taken too much of this medicine contact a poison control center or emergency room at once. NOTE: This medicine is only for you. Do not share this medicine with others. What if I miss a dose? If you are given your dose at a clinic or care team's office, call to reschedule your appointment. If you give your own injections, and you miss a dose, take it as soon as you can. If it is almost time for your next dose, take only that dose. Do not take double or extra doses. What may interact with this medication? Alcohol Colchicine This list may not describe all possible interactions. Give your health care provider  a list of all the medicines, herbs, non-prescription drugs, or dietary supplements you use. Also tell them if you smoke, drink alcohol, or use illegal drugs. Some items may interact with your medicine. What should I watch for while using this medication? Visit your care team regularly. You may need blood work done while you are taking this medication. You may need to follow a special diet. Talk to your care team. Limit your alcohol intake and avoid smoking to get the best benefit. What side effects may I notice from receiving this medication? Side effects that you should report to your care team as soon as possible: Allergic reactions--skin rash, itching, hives, swelling of the face, lips, tongue, or  throat Swelling of the ankles, hands, or feet Trouble breathing Side effects that usually do not require medical attention (report to your care team if they continue or are bothersome): Diarrhea This list may not describe all possible side effects. Call your doctor for medical advice about side effects. You may report side effects to FDA at 1-800-FDA-1088. Where should I keep my medication? Keep out of the reach of children. Store at room temperature between 15 and 30 degrees C (59 and 85 degrees F). Protect from light. Throw away any unused medication after the expiration date. NOTE: This sheet is a summary. It may not cover all possible information. If you have questions about this medicine, talk to your doctor, pharmacist, or health care provider.  2023 Elsevier/Gold Standard (2021-02-27 00:00:00)

## 2021-12-16 IMAGING — CR DG HIP (WITH OR WITHOUT PELVIS) 2-3V*R*
3 series · 3 of 3 positions shown · non-contrast
Comparison: None.

CLINICAL DATA: Acute pelvic pain without known injury.

EXAM:
DG HIP (WITH OR WITHOUT PELVIS) 2-3V RIGHT

[pelvis ap]
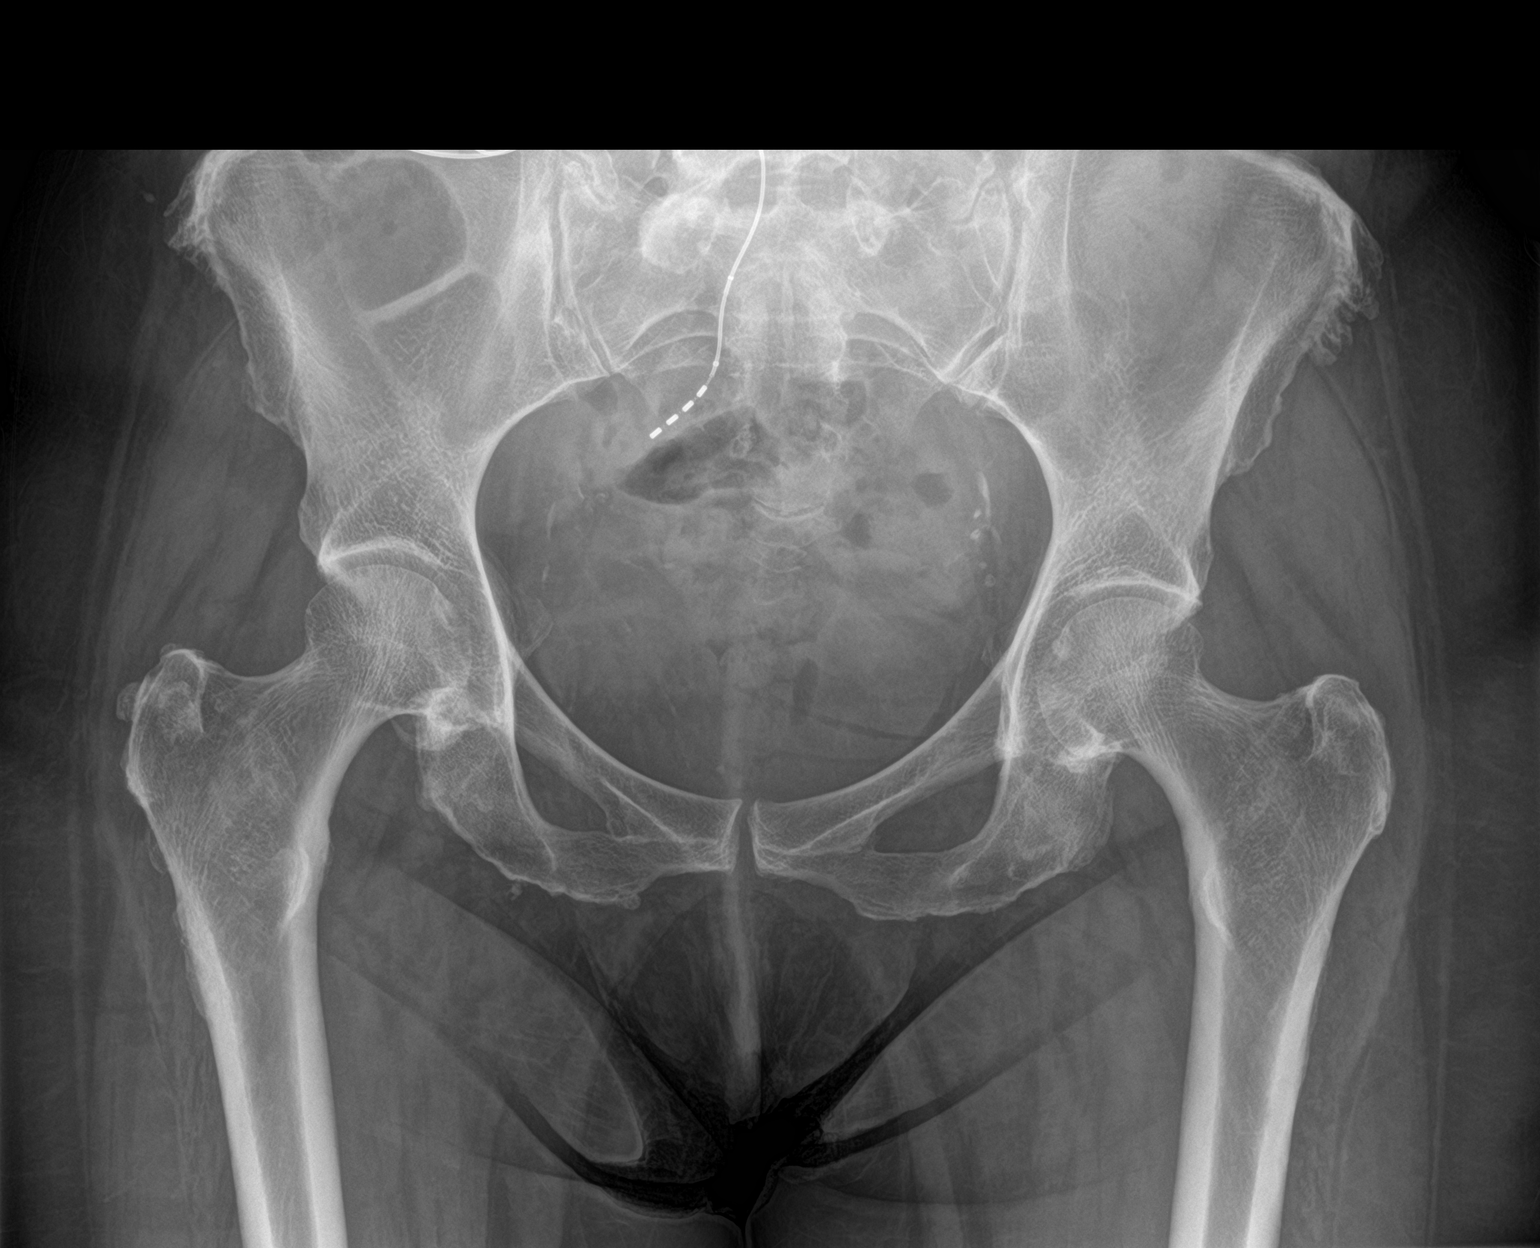

[hip ap]
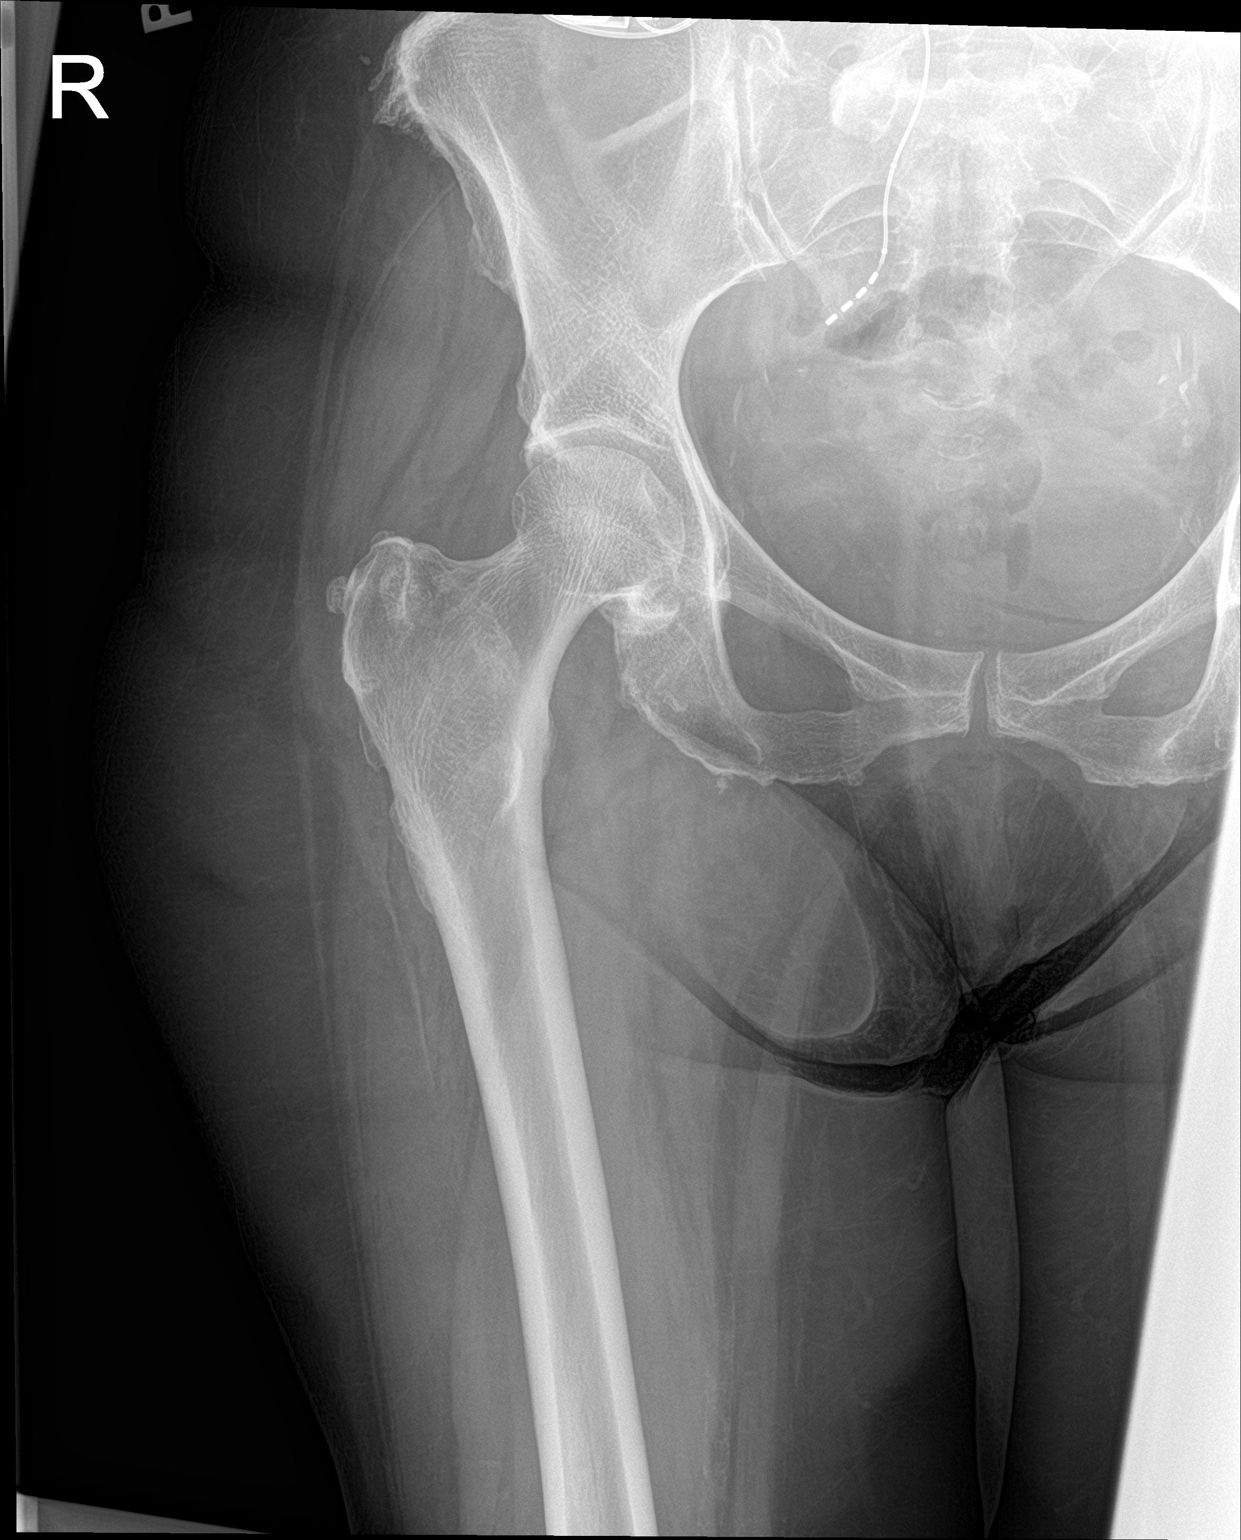

[hip lat]
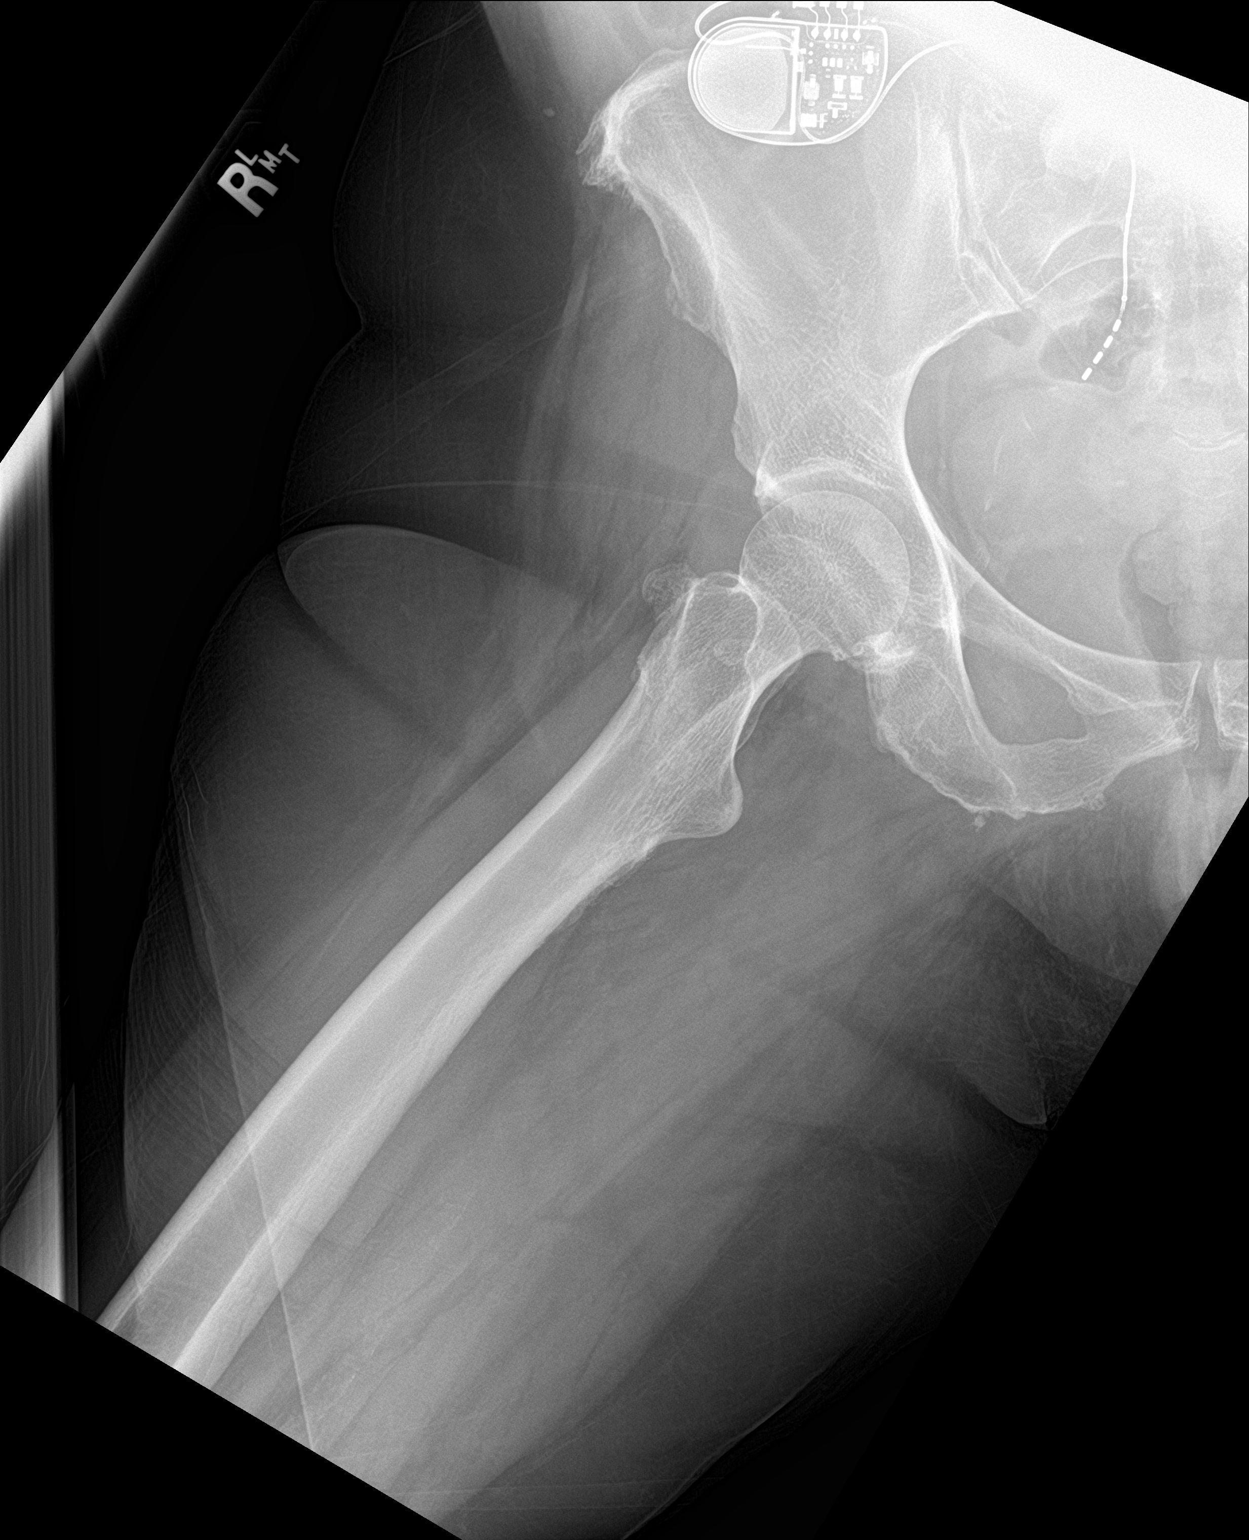

[3 of 3 positions shown; findings below may reference images not displayed]

FINDINGS: There is no evidence of hip fracture or dislocation. There is no
evidence of arthropathy or other focal bone abnormality.
IMPRESSION: Negative.

## 2021-12-17 ENCOUNTER — Inpatient Hospital Stay: Payer: Medicare HMO

## 2021-12-17 VITALS — BP 128/74 | HR 76 | Temp 98.4°F | Resp 18

## 2021-12-17 DIAGNOSIS — R110 Nausea: Secondary | ICD-10-CM | POA: Diagnosis not present

## 2021-12-17 DIAGNOSIS — M2550 Pain in unspecified joint: Secondary | ICD-10-CM | POA: Diagnosis not present

## 2021-12-17 DIAGNOSIS — C9001 Multiple myeloma in remission: Secondary | ICD-10-CM | POA: Diagnosis not present

## 2021-12-17 DIAGNOSIS — C9000 Multiple myeloma not having achieved remission: Secondary | ICD-10-CM

## 2021-12-17 DIAGNOSIS — R197 Diarrhea, unspecified: Secondary | ICD-10-CM | POA: Diagnosis not present

## 2021-12-17 DIAGNOSIS — D801 Nonfamilial hypogammaglobulinemia: Secondary | ICD-10-CM | POA: Diagnosis not present

## 2021-12-17 DIAGNOSIS — G620 Drug-induced polyneuropathy: Secondary | ICD-10-CM | POA: Diagnosis not present

## 2021-12-17 DIAGNOSIS — E538 Deficiency of other specified B group vitamins: Secondary | ICD-10-CM | POA: Diagnosis not present

## 2021-12-17 DIAGNOSIS — M549 Dorsalgia, unspecified: Secondary | ICD-10-CM | POA: Diagnosis not present

## 2021-12-17 LAB — MAGNESIUM: Magnesium: 1.2 mg/dL — ABNORMAL LOW (ref 1.7–2.4)

## 2021-12-17 MED ORDER — MAGNESIUM SULFATE 4 GM/100ML IV SOLN
4.0000 g | Freq: Once | INTRAVENOUS | Status: AC
  Administered 2021-12-17: 4 g via INTRAVENOUS
  Filled 2021-12-17: qty 100

## 2021-12-17 MED ORDER — SODIUM CHLORIDE 0.9% FLUSH
10.0000 mL | Freq: Once | INTRAVENOUS | Status: AC | PRN
  Administered 2021-12-17: 10 mL
  Filled 2021-12-17: qty 10

## 2021-12-17 MED ORDER — SODIUM CHLORIDE 0.9 % IV SOLN
Freq: Once | INTRAVENOUS | Status: AC
  Filled 2021-12-17: qty 250

## 2021-12-17 MED ORDER — SODIUM CHLORIDE 0.9 % IV SOLN
25.0000 mg | Freq: Once | INTRAVENOUS | Status: AC
  Administered 2021-12-17: 25 mg via INTRAVENOUS
  Filled 2021-12-17: qty 1

## 2021-12-17 MED ORDER — SODIUM CHLORIDE 0.9 % IV SOLN: 6.0000 g | Freq: Once | INTRAVENOUS | Status: DC

## 2021-12-17 MED ORDER — MAGNESIUM SULFATE 2 GM/50ML IV SOLN
2.0000 g | Freq: Once | INTRAVENOUS | Status: AC
  Administered 2021-12-17: 2 g via INTRAVENOUS
  Filled 2021-12-17: qty 50

## 2021-12-17 MED ORDER — HEPARIN SOD (PORK) LOCK FLUSH 100 UNIT/ML IV SOLN
500.0000 [IU] | Freq: Once | INTRAVENOUS | Status: AC | PRN
  Administered 2021-12-17: 500 [IU]
  Filled 2021-12-17: qty 5

## 2021-12-22 ENCOUNTER — Other Ambulatory Visit: Payer: Self-pay | Admitting: Internal Medicine

## 2021-12-22 DIAGNOSIS — E118 Type 2 diabetes mellitus with unspecified complications: Secondary | ICD-10-CM

## 2021-12-24 DIAGNOSIS — M542 Cervicalgia: Secondary | ICD-10-CM | POA: Diagnosis not present

## 2021-12-24 DIAGNOSIS — R202 Paresthesia of skin: Secondary | ICD-10-CM | POA: Diagnosis not present

## 2021-12-24 DIAGNOSIS — M545 Low back pain, unspecified: Secondary | ICD-10-CM | POA: Diagnosis not present

## 2021-12-24 DIAGNOSIS — G620 Drug-induced polyneuropathy: Secondary | ICD-10-CM | POA: Diagnosis not present

## 2021-12-24 DIAGNOSIS — R200 Anesthesia of skin: Secondary | ICD-10-CM | POA: Diagnosis not present

## 2021-12-24 DIAGNOSIS — T451X5A Adverse effect of antineoplastic and immunosuppressive drugs, initial encounter: Secondary | ICD-10-CM | POA: Diagnosis not present

## 2021-12-25 ENCOUNTER — Inpatient Hospital Stay: Payer: Medicare HMO

## 2021-12-25 ENCOUNTER — Inpatient Hospital Stay (HOSPITAL_BASED_OUTPATIENT_CLINIC_OR_DEPARTMENT_OTHER): Payer: Medicare HMO | Admitting: Oncology

## 2021-12-25 ENCOUNTER — Encounter: Payer: Self-pay | Admitting: Oncology

## 2021-12-25 VITALS — BP 122/82 | HR 75 | Temp 97.4°F | Wt 152.0 lb

## 2021-12-25 DIAGNOSIS — D649 Anemia, unspecified: Secondary | ICD-10-CM | POA: Diagnosis not present

## 2021-12-25 DIAGNOSIS — G620 Drug-induced polyneuropathy: Secondary | ICD-10-CM

## 2021-12-25 DIAGNOSIS — C9001 Multiple myeloma in remission: Secondary | ICD-10-CM | POA: Diagnosis not present

## 2021-12-25 DIAGNOSIS — D801 Nonfamilial hypogammaglobulinemia: Secondary | ICD-10-CM

## 2021-12-25 DIAGNOSIS — T451X5A Adverse effect of antineoplastic and immunosuppressive drugs, initial encounter: Secondary | ICD-10-CM

## 2021-12-25 DIAGNOSIS — E538 Deficiency of other specified B group vitamins: Secondary | ICD-10-CM

## 2021-12-25 DIAGNOSIS — M2550 Pain in unspecified joint: Secondary | ICD-10-CM | POA: Diagnosis not present

## 2021-12-25 DIAGNOSIS — C9000 Multiple myeloma not having achieved remission: Secondary | ICD-10-CM

## 2021-12-25 DIAGNOSIS — M549 Dorsalgia, unspecified: Secondary | ICD-10-CM | POA: Diagnosis not present

## 2021-12-25 DIAGNOSIS — R110 Nausea: Secondary | ICD-10-CM | POA: Diagnosis not present

## 2021-12-25 DIAGNOSIS — E860 Dehydration: Secondary | ICD-10-CM

## 2021-12-25 DIAGNOSIS — R197 Diarrhea, unspecified: Secondary | ICD-10-CM | POA: Diagnosis not present

## 2021-12-25 LAB — COMPREHENSIVE METABOLIC PANEL
ALT: 42 U/L (ref 0–44)
AST: 43 U/L — ABNORMAL HIGH (ref 15–41)
Albumin: 3.9 g/dL (ref 3.5–5.0)
Alkaline Phosphatase: 91 U/L (ref 38–126)
Anion gap: 7 (ref 5–15)
BUN: 22 mg/dL (ref 8–23)
CO2: 24 mmol/L (ref 22–32)
Calcium: 8.3 mg/dL — ABNORMAL LOW (ref 8.9–10.3)
Chloride: 104 mmol/L (ref 98–111)
Creatinine, Ser: 1.21 mg/dL — ABNORMAL HIGH (ref 0.44–1.00)
GFR, Estimated: 50 mL/min — ABNORMAL LOW (ref >60)
Glucose, Bld: 152 mg/dL — ABNORMAL HIGH (ref 70–99)
Potassium: 3.9 mmol/L (ref 3.5–5.1)
Sodium: 135 mmol/L (ref 135–145)
Total Bilirubin: 0.4 mg/dL (ref 0.3–1.2)
Total Protein: 7 g/dL (ref 6.5–8.1)

## 2021-12-25 LAB — CBC WITH DIFFERENTIAL/PLATELET
Abs Immature Granulocytes: 0.01 10*3/uL (ref 0.00–0.07)
Basophils Absolute: 0 10*3/uL (ref 0.0–0.1)
Basophils Relative: 0 %
Eosinophils Absolute: 0.2 10*3/uL (ref 0.0–0.5)
Eosinophils Relative: 3 %
HCT: 34.1 % — ABNORMAL LOW (ref 36.0–46.0)
Hemoglobin: 11 g/dL — ABNORMAL LOW (ref 12.0–15.0)
Immature Granulocytes: 0 %
Lymphocytes Relative: 19 %
Lymphs Abs: 1.4 10*3/uL (ref 0.7–4.0)
MCH: 26 pg (ref 26.0–34.0)
MCHC: 32.3 g/dL (ref 30.0–36.0)
MCV: 80.6 fL (ref 80.0–100.0)
Monocytes Absolute: 0.6 10*3/uL (ref 0.1–1.0)
Monocytes Relative: 8 %
Neutro Abs: 5.2 10*3/uL (ref 1.7–7.7)
Neutrophils Relative %: 70 %
Platelets: 119 10*3/uL — ABNORMAL LOW (ref 150–400)
RBC: 4.23 MIL/uL (ref 3.87–5.11)
RDW: 13.3 % (ref 11.5–15.5)
WBC: 7.3 10*3/uL (ref 4.0–10.5)
nRBC: 0 % (ref 0.0–0.2)

## 2021-12-25 LAB — MAGNESIUM: Magnesium: 1.2 mg/dL — ABNORMAL LOW (ref 1.7–2.4)

## 2021-12-25 MED ORDER — HEPARIN SOD (PORK) LOCK FLUSH 100 UNIT/ML IV SOLN
500.0000 [IU] | Freq: Once | INTRAVENOUS | Status: AC | PRN
  Administered 2021-12-25: 500 [IU]
  Filled 2021-12-25: qty 5

## 2021-12-25 MED ORDER — SODIUM CHLORIDE 0.9 % IV SOLN
Freq: Once | INTRAVENOUS | Status: AC
  Filled 2021-12-25: qty 250

## 2021-12-25 MED ORDER — SODIUM CHLORIDE 0.9% FLUSH
10.0000 mL | Freq: Once | INTRAVENOUS | Status: AC | PRN
  Administered 2021-12-25: 10 mL
  Filled 2021-12-25: qty 10

## 2021-12-25 MED ORDER — MAGNESIUM SULFATE 2 GM/50ML IV SOLN
2.0000 g | Freq: Once | INTRAVENOUS | Status: AC
  Administered 2021-12-25: 2 g via INTRAVENOUS
  Filled 2021-12-25: qty 50

## 2021-12-25 MED ORDER — SODIUM CHLORIDE 0.9 % IV SOLN: 6.0000 g | Freq: Once | INTRAVENOUS | Status: DC

## 2021-12-25 MED ORDER — SODIUM CHLORIDE 0.9 % IV SOLN
25.0000 mg | Freq: Once | INTRAVENOUS | Status: AC
  Administered 2021-12-25: 25 mg via INTRAVENOUS
  Filled 2021-12-25: qty 1

## 2021-12-25 MED ORDER — MAGNESIUM SULFATE 4 GM/100ML IV SOLN
4.0000 g | Freq: Once | INTRAVENOUS | Status: AC
  Administered 2021-12-25: 4 g via INTRAVENOUS
  Filled 2021-12-25: qty 100

## 2021-12-26 ENCOUNTER — Ambulatory Visit: Payer: Medicare HMO

## 2021-12-26 LAB — KAPPA/LAMBDA LIGHT CHAINS
Kappa free light chain: 4.6 mg/L (ref 3.3–19.4)
Kappa, lambda light chain ratio: 1.15 (ref 0.26–1.65)
Lambda free light chains: 4 mg/L — ABNORMAL LOW (ref 5.7–26.3)

## 2022-01-01 ENCOUNTER — Encounter: Payer: Self-pay | Admitting: Oncology

## 2022-01-01 LAB — MULTIPLE MYELOMA PANEL, SERUM
Albumin SerPl Elph-Mcnc: 3.8 g/dL (ref 2.9–4.4)
Albumin/Glob SerPl: 1.4 (ref 0.7–1.7)
Alpha 1: 0.3 g/dL (ref 0.0–0.4)
Alpha2 Glob SerPl Elph-Mcnc: 0.9 g/dL (ref 0.4–1.0)
B-Globulin SerPl Elph-Mcnc: 0.9 g/dL (ref 0.7–1.3)
Gamma Glob SerPl Elph-Mcnc: 0.8 g/dL (ref 0.4–1.8)
Globulin, Total: 2.9 g/dL (ref 2.2–3.9)
IgA: 19 mg/dL — ABNORMAL LOW (ref 87–352)
IgG (Immunoglobin G), Serum: 861 mg/dL (ref 586–1602)
IgM (Immunoglobulin M), Srm: 6 mg/dL — ABNORMAL LOW (ref 26–217)
Total Protein ELP: 6.7 g/dL (ref 6.0–8.5)

## 2022-01-02 ENCOUNTER — Other Ambulatory Visit: Payer: Self-pay | Admitting: *Deleted

## 2022-01-02 ENCOUNTER — Telehealth: Payer: Self-pay | Admitting: *Deleted

## 2022-01-02 ENCOUNTER — Inpatient Hospital Stay: Payer: Medicare HMO | Attending: Oncology

## 2022-01-02 ENCOUNTER — Inpatient Hospital Stay: Payer: Medicare HMO

## 2022-01-02 DIAGNOSIS — Z79899 Other long term (current) drug therapy: Secondary | ICD-10-CM | POA: Insufficient documentation

## 2022-01-02 DIAGNOSIS — E538 Deficiency of other specified B group vitamins: Secondary | ICD-10-CM | POA: Insufficient documentation

## 2022-01-02 DIAGNOSIS — Z886 Allergy status to analgesic agent status: Secondary | ICD-10-CM | POA: Insufficient documentation

## 2022-01-02 DIAGNOSIS — K521 Toxic gastroenteritis and colitis: Secondary | ICD-10-CM | POA: Insufficient documentation

## 2022-01-02 DIAGNOSIS — R110 Nausea: Secondary | ICD-10-CM | POA: Insufficient documentation

## 2022-01-02 DIAGNOSIS — Z801 Family history of malignant neoplasm of trachea, bronchus and lung: Secondary | ICD-10-CM | POA: Insufficient documentation

## 2022-01-02 DIAGNOSIS — Z885 Allergy status to narcotic agent status: Secondary | ICD-10-CM | POA: Insufficient documentation

## 2022-01-02 DIAGNOSIS — Z888 Allergy status to other drugs, medicaments and biological substances status: Secondary | ICD-10-CM | POA: Insufficient documentation

## 2022-01-02 DIAGNOSIS — Z9484 Stem cells transplant status: Secondary | ICD-10-CM | POA: Insufficient documentation

## 2022-01-02 DIAGNOSIS — F419 Anxiety disorder, unspecified: Secondary | ICD-10-CM | POA: Insufficient documentation

## 2022-01-02 DIAGNOSIS — Z9049 Acquired absence of other specified parts of digestive tract: Secondary | ICD-10-CM | POA: Insufficient documentation

## 2022-01-02 DIAGNOSIS — B952 Enterococcus as the cause of diseases classified elsewhere: Secondary | ICD-10-CM | POA: Insufficient documentation

## 2022-01-02 DIAGNOSIS — Z8249 Family history of ischemic heart disease and other diseases of the circulatory system: Secondary | ICD-10-CM | POA: Insufficient documentation

## 2022-01-02 DIAGNOSIS — Z833 Family history of diabetes mellitus: Secondary | ICD-10-CM | POA: Insufficient documentation

## 2022-01-02 DIAGNOSIS — Z8379 Family history of other diseases of the digestive system: Secondary | ICD-10-CM | POA: Insufficient documentation

## 2022-01-02 DIAGNOSIS — E1122 Type 2 diabetes mellitus with diabetic chronic kidney disease: Secondary | ICD-10-CM | POA: Insufficient documentation

## 2022-01-02 DIAGNOSIS — Z822 Family history of deafness and hearing loss: Secondary | ICD-10-CM | POA: Insufficient documentation

## 2022-01-02 DIAGNOSIS — Z7984 Long term (current) use of oral hypoglycemic drugs: Secondary | ICD-10-CM | POA: Insufficient documentation

## 2022-01-02 DIAGNOSIS — Z803 Family history of malignant neoplasm of breast: Secondary | ICD-10-CM | POA: Insufficient documentation

## 2022-01-02 DIAGNOSIS — Z87891 Personal history of nicotine dependence: Secondary | ICD-10-CM | POA: Insufficient documentation

## 2022-01-02 DIAGNOSIS — Z841 Family history of disorders of kidney and ureter: Secondary | ICD-10-CM | POA: Insufficient documentation

## 2022-01-02 DIAGNOSIS — Z832 Family history of diseases of the blood and blood-forming organs and certain disorders involving the immune mechanism: Secondary | ICD-10-CM | POA: Insufficient documentation

## 2022-01-02 DIAGNOSIS — I129 Hypertensive chronic kidney disease with stage 1 through stage 4 chronic kidney disease, or unspecified chronic kidney disease: Secondary | ICD-10-CM | POA: Insufficient documentation

## 2022-01-02 DIAGNOSIS — N183 Chronic kidney disease, stage 3 unspecified: Secondary | ICD-10-CM | POA: Insufficient documentation

## 2022-01-02 DIAGNOSIS — R197 Diarrhea, unspecified: Secondary | ICD-10-CM | POA: Insufficient documentation

## 2022-01-02 DIAGNOSIS — I350 Nonrheumatic aortic (valve) stenosis: Secondary | ICD-10-CM | POA: Insufficient documentation

## 2022-01-02 DIAGNOSIS — C9001 Multiple myeloma in remission: Secondary | ICD-10-CM | POA: Insufficient documentation

## 2022-01-02 DIAGNOSIS — T451X5A Adverse effect of antineoplastic and immunosuppressive drugs, initial encounter: Secondary | ICD-10-CM | POA: Insufficient documentation

## 2022-01-02 DIAGNOSIS — Z808 Family history of malignant neoplasm of other organs or systems: Secondary | ICD-10-CM | POA: Insufficient documentation

## 2022-01-02 DIAGNOSIS — F32A Depression, unspecified: Secondary | ICD-10-CM | POA: Insufficient documentation

## 2022-01-02 DIAGNOSIS — R339 Retention of urine, unspecified: Secondary | ICD-10-CM | POA: Insufficient documentation

## 2022-01-02 DIAGNOSIS — Z800 Family history of malignant neoplasm of digestive organs: Secondary | ICD-10-CM | POA: Insufficient documentation

## 2022-01-02 DIAGNOSIS — D631 Anemia in chronic kidney disease: Secondary | ICD-10-CM | POA: Insufficient documentation

## 2022-01-02 DIAGNOSIS — D801 Nonfamilial hypogammaglobulinemia: Secondary | ICD-10-CM | POA: Insufficient documentation

## 2022-01-02 DIAGNOSIS — K219 Gastro-esophageal reflux disease without esophagitis: Secondary | ICD-10-CM | POA: Insufficient documentation

## 2022-01-02 DIAGNOSIS — N179 Acute kidney failure, unspecified: Secondary | ICD-10-CM | POA: Insufficient documentation

## 2022-01-02 DIAGNOSIS — R1310 Dysphagia, unspecified: Secondary | ICD-10-CM | POA: Insufficient documentation

## 2022-01-02 MED ORDER — DIPHENOXYLATE-ATROPINE 2.5-0.025 MG PO TABS: 2.0000 | ORAL_TABLET | Freq: Four times a day (QID) | ORAL | 3 refills | Status: DC | PRN

## 2022-01-02 NOTE — Telephone Encounter (Signed)
Pt had spoken to a nurse earlier today and had requested her appointments all to be on  Mondays. I called pt to acknowledge her request and stated we are making a note of her preferences but for the next 2 weeks her appts cannot be changed. She does have a conflict on July 35LE. Those appt here have been changed to Monday 01/21/22. Pt missed appt today as she has a summer cold. States if she is not feeling any better tomorrow she will be going to PCP.

## 2022-01-02 NOTE — Telephone Encounter (Signed)
Patient called to cancel her fluid clinic appointment today and would like to rescedule a future appointment.

## 2022-01-02 NOTE — Telephone Encounter (Signed)
Patient needs RF for Lomotil.

## 2022-01-03 ENCOUNTER — Other Ambulatory Visit: Payer: Self-pay | Admitting: Internal Medicine

## 2022-01-03 DIAGNOSIS — K219 Gastro-esophageal reflux disease without esophagitis: Secondary | ICD-10-CM

## 2022-01-03 NOTE — Telephone Encounter (Signed)
Requested Prescriptions  Pending Prescriptions Disp Refills  . omeprazole (PRILOSEC) 40 MG capsule [Pharmacy Med Name: OMEPRAZOLE 40 MG Capsule Delayed Release] 180 capsule 1    Sig: TAKE 1 CAPSULE (40 MG TOTAL) BY MOUTH IN THE MORNING AND AT BEDTIME.     Gastroenterology: Proton Pump Inhibitors Passed - 01/03/2022  3:26 AM      Passed - Valid encounter within last 12 months    Recent Outpatient Visits          3 weeks ago Annual physical exam   [facility] Glean [facility], [facility]   6 months ago Viral URI with cough   [facility] Glean Hess, MD   7 months ago Exposure to influenza   [place], [place]   9 months ago Pneumonia of right lower lobe due to infectious organism   [facility] Glean Hess, MD   1 year ago Annual physical exam   [facility] Glean Hess, MD      Future Appointments            In 11 months [facility] Melia Jesse Sans, MD [facility]

## 2022-01-04 DIAGNOSIS — M48062 Spinal stenosis, lumbar region with neurogenic claudication: Secondary | ICD-10-CM | POA: Diagnosis not present

## 2022-01-04 DIAGNOSIS — M5136 Other intervertebral disc degeneration, lumbar region: Secondary | ICD-10-CM | POA: Diagnosis not present

## 2022-01-04 DIAGNOSIS — M5416 Radiculopathy, lumbar region: Secondary | ICD-10-CM | POA: Diagnosis not present

## 2022-01-06 ENCOUNTER — Encounter: Payer: Self-pay | Admitting: Internal Medicine

## 2022-01-07 ENCOUNTER — Ambulatory Visit (INDEPENDENT_AMBULATORY_CARE_PROVIDER_SITE_OTHER): Payer: Medicare HMO | Admitting: Family Medicine

## 2022-01-07 VITALS — BP 130/80 | HR 73 | Temp 98.6°F | Ht 62.0 in | Wt 154.0 lb

## 2022-01-07 DIAGNOSIS — J069 Acute upper respiratory infection, unspecified: Secondary | ICD-10-CM

## 2022-01-07 DIAGNOSIS — B9689 Other specified bacterial agents as the cause of diseases classified elsewhere: Secondary | ICD-10-CM

## 2022-01-07 MED ORDER — AZITHROMYCIN 250 MG PO TABS: ORAL_TABLET | ORAL | 0 refills | Status: AC

## 2022-01-07 MED ORDER — PROMETHAZINE-DM 6.25-15 MG/5ML PO SYRP: 5.0000 mL | ORAL_SOLUTION | Freq: Four times a day (QID) | ORAL | 0 refills | Status: DC | PRN

## 2022-01-07 NOTE — Patient Instructions (Signed)
-   Take antibiotics for full course - Take cough medicine as-needed - Take Mucinex twice daily - Take diflucan if yeast noted - Contact this Friday if no improvement

## 2022-01-07 NOTE — Progress Notes (Signed)
     Primary Care / Sports Medicine Office Visit  Patient Information:  Patient ID: Marquasha Brutus, female DOB: 1955/07/12 Age: 66 y.o. MRN: [id number]   Shilah Hefel is a pleasant 66 y.o. female presenting with the following:  No chief complaint on file.   Vitals:   01/07/22 1411  BP: 130/80  Pulse: 73  Temp: 98.6 F (37 C)  SpO2: 94%   Vitals:   01/07/22 1411  Weight: 154 lb (69.9 kg)  Height: '5\' 2"'$  (1.575 m)   Body mass index is 28.17 kg/m.  No results found.   Independent interpretation of notes and tests performed by another provider:   None  Procedures performed:   None  Pertinent History, Exam, Impression, and Recommendations:   Problem List Items Addressed This Visit       Respiratory   Bacterial URI - Primary    12-day history of persistent cough, shortness of air, ear pain and throat pain.  Denies any fevers or chills, producing yellow secretions.  Examination reveals patient intermittently coughing, oxygen saturation low compared to baseline per chart, no oropharyngeal erythema, nasal turbinates mildly swollen and erythematous, nontender sinuses, right tympanic membrane and canal benign, left tympanic membrane with fluid posteriorly, canal benign, no lymphadenopathy noted, equal air entry throughout all fields without wheezes, rales, rhonchi.  Cardiac findings benign.  Given the duration of symptoms, objective findings today, comorbid medical conditions, concern for bacterial upper respiratory infection.  Plan for azithromycin course, as needed antitussive prescribed, recommendation for OTC Mucinex, and Diflucan for frequent yeast infections with antibiotics.  Given her significant medical history I have advised her to contact our office if no significant improvement noted towards the end of the week, at that time chest x-ray and follow-up to be scheduled.       Relevant Medications   promethazine-dextromethorphan (PROMETHAZINE-DM) 6.25-15 MG/5ML  syrup   azithromycin (ZITHROMAX) 250 MG tablet     Orders & Medications Meds ordered this encounter  Medications   promethazine-dextromethorphan (PROMETHAZINE-DM) 6.25-15 MG/5ML syrup    Sig: Take 5 mLs by mouth 4 (four) times daily as needed for cough.    Dispense:  118 mL    Refill:  0   azithromycin (ZITHROMAX) 250 MG tablet    Sig: Take 2 tablets on day 1, then 1 tablet daily on days 2 through 5    Dispense:  6 tablet    Refill:  0   No orders of the defined types were placed in this encounter.    Return if symptoms worsen or fail to improve.     Montel Culver, MD   Primary Care Sports Medicine [place]

## 2022-01-07 NOTE — Assessment & Plan Note (Signed)
12-day history of persistent cough, shortness of air, ear pain and throat pain.  Denies any fevers or chills, producing yellow secretions.  Examination reveals patient intermittently coughing, oxygen saturation low compared to baseline per chart, no oropharyngeal erythema, nasal turbinates mildly swollen and erythematous, nontender sinuses, right tympanic membrane and canal benign, left tympanic membrane with fluid posteriorly, canal benign, no lymphadenopathy noted, equal air entry throughout all fields without wheezes, rales, rhonchi.  Cardiac findings benign.  Given the duration of symptoms, objective findings today, comorbid medical conditions, concern for bacterial upper respiratory infection.  Plan for azithromycin course, as needed antitussive prescribed, recommendation for OTC Mucinex, and Diflucan for frequent yeast infections with antibiotics.  Given her significant medical history I have advised her to contact our office if no significant improvement noted towards the end of the week, at that time chest x-ray and follow-up to be scheduled.

## 2022-01-09 ENCOUNTER — Inpatient Hospital Stay: Payer: Medicare HMO

## 2022-01-09 DIAGNOSIS — C9000 Multiple myeloma not having achieved remission: Secondary | ICD-10-CM

## 2022-01-09 DIAGNOSIS — D631 Anemia in chronic kidney disease: Secondary | ICD-10-CM | POA: Diagnosis not present

## 2022-01-09 DIAGNOSIS — C9001 Multiple myeloma in remission: Secondary | ICD-10-CM | POA: Diagnosis not present

## 2022-01-09 DIAGNOSIS — R110 Nausea: Secondary | ICD-10-CM | POA: Diagnosis not present

## 2022-01-09 DIAGNOSIS — E1122 Type 2 diabetes mellitus with diabetic chronic kidney disease: Secondary | ICD-10-CM | POA: Diagnosis not present

## 2022-01-09 DIAGNOSIS — Z79899 Other long term (current) drug therapy: Secondary | ICD-10-CM | POA: Diagnosis not present

## 2022-01-09 DIAGNOSIS — K521 Toxic gastroenteritis and colitis: Secondary | ICD-10-CM | POA: Diagnosis not present

## 2022-01-09 DIAGNOSIS — K219 Gastro-esophageal reflux disease without esophagitis: Secondary | ICD-10-CM | POA: Diagnosis not present

## 2022-01-09 DIAGNOSIS — R197 Diarrhea, unspecified: Secondary | ICD-10-CM | POA: Diagnosis not present

## 2022-01-09 DIAGNOSIS — N179 Acute kidney failure, unspecified: Secondary | ICD-10-CM | POA: Diagnosis not present

## 2022-01-09 DIAGNOSIS — F32A Depression, unspecified: Secondary | ICD-10-CM | POA: Diagnosis not present

## 2022-01-09 DIAGNOSIS — R1310 Dysphagia, unspecified: Secondary | ICD-10-CM | POA: Diagnosis not present

## 2022-01-09 DIAGNOSIS — Z9484 Stem cells transplant status: Secondary | ICD-10-CM | POA: Diagnosis not present

## 2022-01-09 DIAGNOSIS — Z87891 Personal history of nicotine dependence: Secondary | ICD-10-CM | POA: Diagnosis not present

## 2022-01-09 DIAGNOSIS — R339 Retention of urine, unspecified: Secondary | ICD-10-CM | POA: Diagnosis not present

## 2022-01-09 DIAGNOSIS — Z7984 Long term (current) use of oral hypoglycemic drugs: Secondary | ICD-10-CM | POA: Diagnosis not present

## 2022-01-09 DIAGNOSIS — D801 Nonfamilial hypogammaglobulinemia: Secondary | ICD-10-CM | POA: Diagnosis not present

## 2022-01-09 DIAGNOSIS — T451X5A Adverse effect of antineoplastic and immunosuppressive drugs, initial encounter: Secondary | ICD-10-CM | POA: Diagnosis not present

## 2022-01-09 DIAGNOSIS — E538 Deficiency of other specified B group vitamins: Secondary | ICD-10-CM | POA: Diagnosis not present

## 2022-01-09 DIAGNOSIS — B952 Enterococcus as the cause of diseases classified elsewhere: Secondary | ICD-10-CM | POA: Diagnosis not present

## 2022-01-09 DIAGNOSIS — I129 Hypertensive chronic kidney disease with stage 1 through stage 4 chronic kidney disease, or unspecified chronic kidney disease: Secondary | ICD-10-CM | POA: Diagnosis not present

## 2022-01-09 DIAGNOSIS — F419 Anxiety disorder, unspecified: Secondary | ICD-10-CM | POA: Diagnosis not present

## 2022-01-09 DIAGNOSIS — I350 Nonrheumatic aortic (valve) stenosis: Secondary | ICD-10-CM | POA: Diagnosis not present

## 2022-01-09 DIAGNOSIS — N183 Chronic kidney disease, stage 3 unspecified: Secondary | ICD-10-CM | POA: Diagnosis not present

## 2022-01-09 LAB — MAGNESIUM: Magnesium: 1.2 mg/dL — ABNORMAL LOW (ref 1.7–2.4)

## 2022-01-09 MED ORDER — HEPARIN SOD (PORK) LOCK FLUSH 100 UNIT/ML IV SOLN
500.0000 [IU] | Freq: Once | INTRAVENOUS | Status: AC | PRN
  Administered 2022-01-09: 500 [IU]
  Filled 2022-01-09: qty 5

## 2022-01-09 MED ORDER — SODIUM CHLORIDE 0.9 % IV SOLN
25.0000 mg | Freq: Once | INTRAVENOUS | Status: AC
  Administered 2022-01-09: 25 mg via INTRAVENOUS
  Filled 2022-01-09: qty 1

## 2022-01-09 MED ORDER — SODIUM CHLORIDE 0.9 % IV SOLN: 6.0000 g | Freq: Once | INTRAVENOUS | Status: DC

## 2022-01-09 MED ORDER — MAGNESIUM SULFATE 4 GM/100ML IV SOLN
4.0000 g | Freq: Once | INTRAVENOUS | Status: AC
  Administered 2022-01-09: 4 g via INTRAVENOUS
  Filled 2022-01-09: qty 100

## 2022-01-09 MED ORDER — MAGNESIUM SULFATE 2 GM/50ML IV SOLN
2.0000 g | Freq: Once | INTRAVENOUS | Status: AC
  Administered 2022-01-09: 2 g via INTRAVENOUS
  Filled 2022-01-09: qty 50

## 2022-01-09 MED ORDER — SODIUM CHLORIDE 0.9 % IV SOLN
Freq: Once | INTRAVENOUS | Status: AC
  Filled 2022-01-09: qty 250

## 2022-01-09 MED ORDER — SODIUM CHLORIDE 0.9% FLUSH
10.0000 mL | Freq: Once | INTRAVENOUS | Status: AC | PRN
  Administered 2022-01-09: 10 mL
  Filled 2022-01-09: qty 10

## 2022-01-10 ENCOUNTER — Inpatient Hospital Stay: Payer: Medicare HMO

## 2022-01-14 ENCOUNTER — Encounter: Payer: Self-pay | Admitting: Oncology

## 2022-01-14 ENCOUNTER — Encounter: Payer: Self-pay | Admitting: Internal Medicine

## 2022-01-16 ENCOUNTER — Inpatient Hospital Stay: Payer: Medicare HMO

## 2022-01-16 DIAGNOSIS — C9001 Multiple myeloma in remission: Secondary | ICD-10-CM | POA: Diagnosis not present

## 2022-01-16 DIAGNOSIS — C9000 Multiple myeloma not having achieved remission: Secondary | ICD-10-CM

## 2022-01-16 DIAGNOSIS — R110 Nausea: Secondary | ICD-10-CM | POA: Diagnosis not present

## 2022-01-16 DIAGNOSIS — K521 Toxic gastroenteritis and colitis: Secondary | ICD-10-CM | POA: Diagnosis not present

## 2022-01-16 DIAGNOSIS — N179 Acute kidney failure, unspecified: Secondary | ICD-10-CM | POA: Diagnosis not present

## 2022-01-16 DIAGNOSIS — E538 Deficiency of other specified B group vitamins: Secondary | ICD-10-CM | POA: Diagnosis not present

## 2022-01-16 DIAGNOSIS — D801 Nonfamilial hypogammaglobulinemia: Secondary | ICD-10-CM | POA: Diagnosis not present

## 2022-01-16 DIAGNOSIS — Z9484 Stem cells transplant status: Secondary | ICD-10-CM | POA: Diagnosis not present

## 2022-01-16 DIAGNOSIS — I350 Nonrheumatic aortic (valve) stenosis: Secondary | ICD-10-CM | POA: Diagnosis not present

## 2022-01-16 DIAGNOSIS — R197 Diarrhea, unspecified: Secondary | ICD-10-CM | POA: Diagnosis not present

## 2022-01-16 LAB — MAGNESIUM: Magnesium: 1.2 mg/dL — ABNORMAL LOW (ref 1.7–2.4)

## 2022-01-16 MED ORDER — SODIUM CHLORIDE 0.9% FLUSH
3.0000 mL | Freq: Once | INTRAVENOUS | Status: DC | PRN
  Filled 2022-01-16: qty 3

## 2022-01-16 MED ORDER — HEPARIN SOD (PORK) LOCK FLUSH 100 UNIT/ML IV SOLN
500.0000 [IU] | Freq: Once | INTRAVENOUS | Status: AC | PRN
  Administered 2022-01-16: 500 [IU]
  Filled 2022-01-16: qty 5

## 2022-01-16 MED ORDER — SODIUM CHLORIDE 0.9 % IV SOLN
25.0000 mg | Freq: Once | INTRAVENOUS | Status: AC
  Administered 2022-01-16: 25 mg via INTRAVENOUS
  Filled 2022-01-16: qty 1

## 2022-01-16 MED ORDER — SODIUM CHLORIDE 0.9% FLUSH
10.0000 mL | Freq: Once | INTRAVENOUS | Status: AC | PRN
  Administered 2022-01-16: 10 mL
  Filled 2022-01-16: qty 10

## 2022-01-16 MED ORDER — MAGNESIUM SULFATE 4 GM/100ML IV SOLN
4.0000 g | Freq: Once | INTRAVENOUS | Status: AC
  Administered 2022-01-16: 4 g via INTRAVENOUS

## 2022-01-16 MED ORDER — MAGNESIUM SULFATE 2 GM/50ML IV SOLN
2.0000 g | Freq: Once | INTRAVENOUS | Status: AC
  Administered 2022-01-16: 2 g via INTRAVENOUS

## 2022-01-16 MED ORDER — SODIUM CHLORIDE 0.9 % IV SOLN
Freq: Once | INTRAVENOUS | Status: AC
  Filled 2022-01-16: qty 250

## 2022-01-16 MED ORDER — SODIUM CHLORIDE 0.9 % IV SOLN: 6.0000 g | Freq: Once | INTRAVENOUS | Status: DC

## 2022-01-16 NOTE — Progress Notes (Signed)
Reports having more diarrhea this week. 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 Dentist aware of this. Chronic back pain. URI improved. Discharged to home at completion of 3 hours of IV magnesium.

## 2022-01-17 ENCOUNTER — Inpatient Hospital Stay: Payer: Medicare HMO

## 2022-01-21 ENCOUNTER — Other Ambulatory Visit: Payer: Self-pay

## 2022-01-21 ENCOUNTER — Encounter: Payer: Self-pay | Admitting: Oncology

## 2022-01-21 ENCOUNTER — Inpatient Hospital Stay: Payer: Medicare HMO

## 2022-01-21 ENCOUNTER — Inpatient Hospital Stay (HOSPITAL_BASED_OUTPATIENT_CLINIC_OR_DEPARTMENT_OTHER): Payer: Medicare HMO | Admitting: Oncology

## 2022-01-21 ENCOUNTER — Encounter: Payer: Self-pay | Admitting: Internal Medicine

## 2022-01-21 VITALS — BP 120/80 | HR 68 | Temp 98.6°F | Resp 18 | Wt 155.8 lb

## 2022-01-21 DIAGNOSIS — T451X5A Adverse effect of antineoplastic and immunosuppressive drugs, initial encounter: Secondary | ICD-10-CM

## 2022-01-21 DIAGNOSIS — D649 Anemia, unspecified: Secondary | ICD-10-CM

## 2022-01-21 DIAGNOSIS — G620 Drug-induced polyneuropathy: Secondary | ICD-10-CM

## 2022-01-21 DIAGNOSIS — D801 Nonfamilial hypogammaglobulinemia: Secondary | ICD-10-CM | POA: Diagnosis not present

## 2022-01-21 DIAGNOSIS — C9001 Multiple myeloma in remission: Secondary | ICD-10-CM

## 2022-01-21 DIAGNOSIS — I350 Nonrheumatic aortic (valve) stenosis: Secondary | ICD-10-CM | POA: Diagnosis not present

## 2022-01-21 DIAGNOSIS — R197 Diarrhea, unspecified: Secondary | ICD-10-CM | POA: Diagnosis not present

## 2022-01-21 DIAGNOSIS — R110 Nausea: Secondary | ICD-10-CM | POA: Diagnosis not present

## 2022-01-21 DIAGNOSIS — Z9484 Stem cells transplant status: Secondary | ICD-10-CM | POA: Diagnosis not present

## 2022-01-21 DIAGNOSIS — E538 Deficiency of other specified B group vitamins: Secondary | ICD-10-CM

## 2022-01-21 DIAGNOSIS — C9000 Multiple myeloma not having achieved remission: Secondary | ICD-10-CM

## 2022-01-21 DIAGNOSIS — N179 Acute kidney failure, unspecified: Secondary | ICD-10-CM | POA: Diagnosis not present

## 2022-01-21 DIAGNOSIS — K521 Toxic gastroenteritis and colitis: Secondary | ICD-10-CM | POA: Diagnosis not present

## 2022-01-21 LAB — COMPREHENSIVE METABOLIC PANEL
ALT: 48 U/L — ABNORMAL HIGH (ref 0–44)
AST: 45 U/L — ABNORMAL HIGH (ref 15–41)
Albumin: 4 g/dL (ref 3.5–5.0)
Alkaline Phosphatase: 93 U/L (ref 38–126)
Anion gap: 4 — ABNORMAL LOW (ref 5–15)
BUN: 24 mg/dL — ABNORMAL HIGH (ref 8–23)
CO2: 22 mmol/L (ref 22–32)
Calcium: 8.8 mg/dL — ABNORMAL LOW (ref 8.9–10.3)
Chloride: 109 mmol/L (ref 98–111)
Creatinine, Ser: 1.06 mg/dL — ABNORMAL HIGH (ref 0.44–1.00)
GFR, Estimated: 58 mL/min — ABNORMAL LOW (ref >60)
Glucose, Bld: 146 mg/dL — ABNORMAL HIGH (ref 70–99)
Potassium: 4.4 mmol/L (ref 3.5–5.1)
Sodium: 135 mmol/L (ref 135–145)
Total Bilirubin: 0.4 mg/dL (ref 0.3–1.2)
Total Protein: 7.1 g/dL (ref 6.5–8.1)

## 2022-01-21 LAB — CBC WITH DIFFERENTIAL/PLATELET
Abs Immature Granulocytes: 0.02 10*3/uL (ref 0.00–0.07)
Basophils Absolute: 0 10*3/uL (ref 0.0–0.1)
Basophils Relative: 0 %
Eosinophils Absolute: 0.2 10*3/uL (ref 0.0–0.5)
Eosinophils Relative: 2 %
HCT: 33.7 % — ABNORMAL LOW (ref 36.0–46.0)
Hemoglobin: 10.8 g/dL — ABNORMAL LOW (ref 12.0–15.0)
Immature Granulocytes: 0 %
Lymphocytes Relative: 25 %
Lymphs Abs: 1.7 10*3/uL (ref 0.7–4.0)
MCH: 25.5 pg — ABNORMAL LOW (ref 26.0–34.0)
MCHC: 32 g/dL (ref 30.0–36.0)
MCV: 79.5 fL — ABNORMAL LOW (ref 80.0–100.0)
Monocytes Absolute: 0.6 10*3/uL (ref 0.1–1.0)
Monocytes Relative: 8 %
Neutro Abs: 4.3 10*3/uL (ref 1.7–7.7)
Neutrophils Relative %: 65 %
Platelets: 124 10*3/uL — ABNORMAL LOW (ref 150–400)
RBC: 4.24 MIL/uL (ref 3.87–5.11)
RDW: 14.6 % (ref 11.5–15.5)
WBC: 6.8 10*3/uL (ref 4.0–10.5)
nRBC: 0 % (ref 0.0–0.2)

## 2022-01-21 LAB — MAGNESIUM: Magnesium: 1.3 mg/dL — ABNORMAL LOW (ref 1.7–2.4)

## 2022-01-21 MED ORDER — SODIUM CHLORIDE 0.9 % IV SOLN: 6.0000 g | Freq: Once | INTRAVENOUS | Status: DC

## 2022-01-21 MED ORDER — MAGNESIUM SULFATE 4 GM/100ML IV SOLN
4.0000 g | Freq: Once | INTRAVENOUS | Status: AC
  Administered 2022-01-21: 4 g via INTRAVENOUS
  Filled 2022-01-21: qty 100

## 2022-01-21 MED ORDER — HEPARIN SOD (PORK) LOCK FLUSH 100 UNIT/ML IV SOLN
500.0000 [IU] | Freq: Once | INTRAVENOUS | Status: AC
  Administered 2022-01-21: 500 [IU] via INTRAVENOUS
  Filled 2022-01-21: qty 5

## 2022-01-21 MED ORDER — SODIUM CHLORIDE 0.9 % IV SOLN
Freq: Once | INTRAVENOUS | Status: AC
  Filled 2022-01-21: qty 250

## 2022-01-21 MED ORDER — MAGNESIUM SULFATE 2 GM/50ML IV SOLN
2.0000 g | Freq: Once | INTRAVENOUS | Status: AC
  Administered 2022-01-21: 2 g via INTRAVENOUS
  Filled 2022-01-21: qty 50

## 2022-01-21 MED ORDER — SODIUM CHLORIDE 0.9% FLUSH
10.0000 mL | Freq: Once | INTRAVENOUS | Status: AC
  Administered 2022-01-21: 10 mL via INTRAVENOUS
  Filled 2022-01-21: qty 10

## 2022-01-21 MED ORDER — SODIUM CHLORIDE 0.9 % IV SOLN
25.0000 mg | Freq: Once | INTRAVENOUS | Status: AC
  Administered 2022-01-21: 25 mg via INTRAVENOUS
  Filled 2022-01-21: qty 1

## 2022-01-21 NOTE — Addendum Note (Signed)
Addended by: Earlie Server on: 01/21/2022 08:25 PM   Modules accepted: Orders

## 2022-01-21 NOTE — Progress Notes (Signed)
Patient here for follow up. Pt reports she was on antibiotics last week due to URI.

## 2022-01-21 NOTE — Progress Notes (Addendum)
Hematology/Oncology Progress note [phone] [phone]        Clinic Day:  01/21/2022   Referring physician: Glean Hess, MD  Chief Complaint: Sarah [name] is a 66 y.o. 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   Initial bone marrow revealed 80% plasma cells.  Lambda free light chains were 1340.  She had nephrotic range proteinuria.  She initially underwent induction with RVD.  Revlimid maintenance was discontinued on 01/21/2017 secondary to intolerance.     07/19/2019 Bone marrow aspirate and biopsy on  revealed a normocellular marrow with but increased lambda-restricted plasma cells (9% aspirate, 40% CD138 immunohistochemistry).  Findings were consistent with recurrent plasma cell myeloma.  Flow cytometry revealed no monoclonal B-cell or phenotypically aberrant T-cell population. Cytogenetics were 22, XX (normal).  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    Lambda light chains have been followed: 22.2 (ratio 0.56) on 07/03/2017, 30.8 (ratio 0.78) on 09/02/2017, 36.9 (ratio 0.40) on 10/21/2017, 37.4 (ratio 0.41) on 12/16/2017, 70.7(ratio 0.31)  on 02/17/2018, 64.2 (ratio 0.27) on 04/07/2018, 78.9 (ratio 0.18) on 05/26/2018, 128.8 (ratio 0.17) on 08/06/2018, 181.5 (ratio 0.13) on 10/08/2018, 130.9 (ratio 0.13) on 10/20/2018, 160.7 (ratio  0.10) on 12/09/2018, 236.6 (ratio 0.07) on 02/01/2019, 363.6 (ratio 0.04) on 03/22/2019, 404.8 (ratio 0.04) on 04/05/2019, 420.7 (ratio 0.03) on 05/24/2019, 573.4 (ratio 0.03) on 06/23/2019, 451.05 (ratio 0.02) on 08/20/2019, 47.2 (ratio 0.13) on 10/25/2019, 22.4 (ratio 0.21) on 11/25/2019, 16.5 (ratio 0.33) on 01/10/2020, 14.6 (ratio 0.34) on 02/07/2020, 13.1 (ratio 0.31) on 03/07/2020, 10.1 (ratio 0.38) on 04/10/2020, and 9.5 (ratio 0.21) on 06/19/2020.   24 hour UPEP on 06/03/2019 revealed kappa free light chains 95.76, lambda free light chains 1,260.71, and ratio 0.08.  24 hour UPEP on 08/23/2019 revealed total protein of 782 mg/24 hrs with lambda free light chains 1,084.16 mg/L and ratio of 0.10 (1.03-31.76).  M spike in urine was 46.1% (361 mg/24 hrs).    Bone survey on 04/08/2016 and 05/28/2017 revealed no definite lytic lesion seen in the visualized skeleton.  Bone survey on 11/19/2018 revealed no suspicious lucent lesions and no acute bony abnormality.  PET scan on 07/12/2019 revealed no focal metabolic activity to suggest active myeloma within the skeleton. There were no lytic lesions identified on the CT portion of the exam or soft tissue plasmacytomas. There was no evidence of multiple myeloma.    Pretreatment RBC phenotype on 09/23/2019 was positive for C, e, DUFFY B, KIDD B, M, S, and s antigen; negative for c, E, KELL, DUFFY A, KIDD A, and N antigen.    09/27/2019 - 10/25/2019; 12/09/2019 - 03/13/2020 6 cycles of daratumumab and hyaluronidase-fihj, Pomalyst, and Decadron (DPd) .  Cycle #1 was complicated by fever and neutropenia requiring admission.  Cycle #2 was complicated by pneumonia requiring admission.  Cycle #6 was complicated with an ER evaluation for an elevated lactic acid.   05/10/2020 second autologous stem cell transplant at  [facility]   She underwent conditioning with melphalan 140 mg/m2.    She is s/p week #3 Velcade maintenance (began 08/31/2020 - 09/28/2020).  She has no increase  in neuropathy.  She has severe aortic stenosis.  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 Cardiology is following for a possible TAVR in the future.  Echo on 07/05/2020 revealed moderate to severe aortic stenosis with an EF of 50-55%.  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 Zometa was discontinued in 01/2017.  She has chronic nausea on Phenergan.     B12 deficiency.  B12 was 254 on 04/09/2017, 295 on 08/20/2018, and 391 on 10/08/2018.  She was on oral B12.  She received B12 monthly (last 09/14/2020).  Folate was 12.1 on 01/10/2020.    She has iron deficiency.  Ferritin was 32 on 07/01/2019.  She received Venofer on 07/15/2019 and 07/22/2019.   She has hypogammaglobulinemia.  IgG was 245 on 11/25/2019.  She received monthly IVIG (07/22/021 - 03/22/2020).  She received IVIG 400 mg/kg on 01/20/2020, 200 mg/kg on 02/17/2020, and 300 mg/kg on 03/22/2020.  IVIG on 84/69/6295 was complicated by acute renal failure. IgG trough level was 418 on 02/17/2020.  10/15/2019 - 10/20/2019 She was admitted to [facility] with fever and neutropenia.  Cultures were negative.  CXR was negative. She received broad spectrum antibiotics and daily Granix.  She received IVF for acute renal insufficiency due to diarrhea and dehydration.  Creatine was 1.66 on admission and 1.12 on discharge.    03/01/2020 - 03/05/2020 admitted to [facility] from with fever and neutropenia. CXR revealed no active cardiopulmonary disease. Chest CT with contrast revealed no acute intrathoracic pathology. There were findings which could be suggestive of prior granulomatous disease. She was treated with Cefepime and Vancomycin, then switched to ciprofloxacin on 03/03/2020.   05/08/2020 - 12/05/2021The patient was admitted to [facility] from  for autologous bone marrow transplant.   She received conditioning with melphalan 140 mg/m2.  Bone marrow transplant was on  05/10/2020. The patient developed fevers daily from 05/19/2020 - 05/24/2020. Blood cultures were + for strept sanguis bacteremia.  She was treated cefepime, vancomycin, and solumedrol for possible engraftment syndrome. Cefepime was switched to ceftriaxone; she completed 2 weeks of ceftriaxone on 06/03/2020. She had chemotherapy induced diarrhea.  She experienced urinary retention.  Feb 2022 patient has been on maintenance Velcade 2 mg every 2 weeks.  Chronic diarrhea seen by gastroenterology Dr. Vicente Males and was recommended to avoid artificial sugars in her diet including Diet Coke and coffee mate.  She feels some improvement of her diarrhea frequency since the dietary modification.  GI profile PCR negative, C. difficile toxin A+ B negative, fecal calprotectin 77 12/19/2020 colonoscopy showed 2 subcentimeter polyps in the ascending colon, resected and removed.  Pathology showed tubular adenoma.  No malignancy.  Normal mucosa in the entire examined colon.  Biopsied, pathology showed colonic mucosa with no significant pathological alteration.  Negative for active inflammation and features of chronicity.  Negative for microscopic colitis, dysplasia, and malignanc  Diabetes, patient has discontinued metformin due to diarrhea and started on glipizide 2.5 mg   03/05/2021-03/09/2021 Patient was hospitalized due to fever and chills, nonproductive cough, shortness of breath. X-ray showed right lower lobe atelectasis versus pneumonia.  Blood cultures negative.  Urine culture showed 60,000 colonies of Enterococcus facialis.  Patient received antibiotics. Patient also had abdominal pelvis CT scan for work-up of intermittent  lower abdomen pain.No acute abnormality.   05/14/21 last dose of Velcade maintenance.  establish care with neurology Dr. Gurney Maxin and her neuropathy regimen has been switched from gabapentin to Lyrica. 05/29/2021 patient got influenza   neuropathy medication has been switched from  gabapentin/Lyrica to nortriptyline.  08/18/2021 Hospitalized due to pneumonia from metapnuemovirus, hospitalization was complicated with NSTEMI. She received heparin gtt.  Treated with antibiotics for coverage of pneumonia,  diuretics for pulmonary edema. She received IVIG 453m/kg x 1 for hypoglobulinemia. Discharged on 08/25/21 with Augmentin and Zithromax.   May 2023, Daratumumab being held for [facility] infectious disease physician Dr. GFatima Sangerdue to frequent infection.  INTERVAL HISTORY Sarah [name] a 66y.o. female who has above history reviewed by me today presents for follow up visit for management of multiple myeloma.  Patient has been on weekly magnesium infusion as needed hypomagnesemia Patient gets IVIG if immunoglobulin level < 500 Patient reports feeling well.  She had another upper respiratory infection during interval.  Symptom has resolved.  No fever, chills, nausea vomiting  Chronic diarrhea, symptoms are stable.    Past Medical History:  Diagnosis Date   Anemia    Anxiety    Aortic stenosis    a. 05/2020 Echo: EF 45-50%, sev AS - [facility] CT[facility] sugg of tricuspid valve w/ fusing of 2 leaflets-TAVR deferred in setting of acute infxn; b. 07/2020 Echo: EF 50-55%, mod-sev AS; c. 12/2020 Echo: EF>55%. Mod-sev paradoxical low-flow low-gradient AS; d. 08/2021 Echo: EF 35-40%, severe AS, triv AI, mild MR.   Arthritis    Bicuspid aortic valve    Bisphosphonate-associated osteonecrosis of the jaw ([place] 02/25/2017   Due to Zometa   Cardiomyopathy, idiopathic ([place]    a. Variable EF over time; b. 08/2017 Echo: EF 40%; b. 03/2020 Echo: EF 55-60%; c. 05/2020 Echo: EF 45-50%; d. 07/2020 Echo: EF 50-55%; e. 12/2020 Echo: EF>55%; f. 08/2021 Echo: EF 35-40%.   Chronic heart failure with preserved ejection fraction (HFpEF) ([place]    a. Variable EF over time; b. 08/2017 Echo: EF 40%; b. 03/2020 Echo: EF 55-60%; c. 05/2020 Echo: EF 45-50%; d. 07/2020 Echo: EF 50-55%; e. 12/2020 Echo:  EF>55%; f. 08/2021 Echo: EF 35-40%, no RV, mildly dil LA, mild MR, triv AI, severe AS.   CKD (chronic kidney disease) stage 3, GFR 30-59 ml/min (HCC)    Depression    Diabetes mellitus (HCC)    Dizziness    Fatty liver    Frequent falls    GERD (gastroesophageal reflux disease)    Gout    Heart murmur    History of blood transfusion    [place] ([facility] [place] [facility]    History of uterine fibroid    Hx of cardiac catheterization    a. 01/2016 Cath ([facility]- after abnl nuc): Nl cors.   Hypertension    Hypomagnesemia    IDA (iron deficiency anemia)    Multiple myeloma (HCC)    Personal history of chemotherapy    PSVT (paroxysmal supraventricular tachycardia) ([place]    a. S/p [place]([facility].   PVC's (premature ventricular contractions)    a. Well-managed w/ bisoprolol in outpt setting.   Renal cyst     Past Surgical History:  Procedure Laterality Date   ABDOMINAL HYSTERECTOMY     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   CARDIAC ELECTROPHYSIOLOGY MAPPING AND ABLATION     CARPAL TUNNEL RELEASE Bilateral    CHOLECYSTECTOMY  2008  COLONOSCOPY WITH PROPOFOL N/A 05/07/2017   Procedure: COLONOSCOPY WITH PROPOFOL;  Surgeon: [name], MD;  Location: [facility] ENDOSCOPY;  Service: Gastroenterology;  Laterality: N/A;   COLONOSCOPY WITH PROPOFOL N/A 12/19/2020   Procedure: COLONOSCOPY WITH PROPOFOL;  Surgeon: [name], MD;  Location: [facility] ENDOSCOPY;  Service: Gastroenterology;  Laterality: N/A;   ESOPHAGOGASTRODUODENOSCOPY (EGD) WITH PROPOFOL N/A 05/07/2017   Procedure: ESOPHAGOGASTRODUODENOSCOPY (EGD) WITH PROPOFOL;  Surgeon: [name], MD;  Location: [facility] ENDOSCOPY;  Service: Gastroenterology;  Laterality: N/A;   FOOT SURGERY Bilateral    INCONTINENCE SURGERY  2009   INTERSTIM IMPLANT PLACEMENT     other     over active bladder   OTHER SURGICAL HISTORY     bladder stimulator    PARTIAL HYSTERECTOMY  03/1996   fibroids   PORTA CATH INSERTION N/A 03/10/2019   Procedure:  PORTA CATH INSERTION;  Surgeon: Algernon Huxley, MD;  Location: [place] CV LAB;  Service: Cardiovascular;  Laterality: N/A;   TONSILLECTOMY  2007    Family History  Problem Relation Age of Onset   Colon cancer Father    Renal Disease Father    Diabetes Mellitus II Father    Melanoma Paternal Grandmother    Breast cancer Maternal Aunt 51   Anemia Mother    Heart disease Mother    Heart failure Mother    Renal Disease Mother    Congestive Heart Failure Mother    Heart disease Maternal Uncle    Throat cancer Maternal Uncle    Lung cancer Maternal Uncle    Liver disease Maternal Uncle    Heart failure Maternal Uncle    Hearing loss Son 4       Suicide     Social History:  reports that she quit smoking about 30 years ago. Her smoking use included cigarettes. She has a 20.00 pack-year smoking history. She has never used smokeless tobacco. She reports current alcohol use. She reports that she does not use drugs.  She is on disability. She notes exposure to perchloroethylene [facility]).   Allergies:  Allergies  Allergen Reactions   Oxycodone-Acetaminophen Anaphylaxis    Swelling and rash   Celebrex [Celecoxib] Diarrhea   Codeine    Plerixafor     In 2016 during ASCT collection patient developed fever to 103.68F and required hospitalization   Benadryl [Diphenhydramine] Palpitations   Morphine Itching and Rash   Ondansetron Diarrhea   Tylenol [Acetaminophen] Itching and Rash    Current Medications: Current Outpatient Medications  Medication Sig Dispense Refill   acyclovir (ZOVIRAX) 400 MG tablet Take 1 tablet (400 mg total) by mouth 2 (two) times daily. 180 tablet 1   bisoprolol (ZEBETA) 5 MG tablet Take 0.5 tablets (2.5 mg total) by mouth daily. 30 tablet 0   DULoxetine (CYMBALTA) 60 MG capsule Take 1 capsule (60 mg total) by mouth daily. 90 capsule 0   FLUoxetine (PROZAC) 40 MG capsule TAKE 1 CAPSULE EVERY DAY 90 capsule 0   glipiZIDE (GLUCOTROL XL) 2.5 MG 24 hr tablet Take 1  tablet (2.5 mg total) by mouth daily with breakfast. 90 tablet 1   glucose blood test strip      guaiFENesin-dextromethorphan (ROBITUSSIN DM) 100-10 MG/5ML syrup Take 5 mLs by mouth every 4 (four) hours as needed for cough. 118 mL 0   montelukast (SINGULAIR) 10 MG tablet Take 1 tablet (10 mg total) by mouth See admin instructions. Take 1 tab daily for 2 days after daratumumab treatments 30 tablet 1   Multiple Vitamin (  MULTIVITAMIN) tablet Take 1 tablet by mouth daily.     omeprazole (PRILOSEC) 40 MG capsule TAKE 1 CAPSULE (40 MG TOTAL) BY MOUTH IN THE MORNING AND AT BEDTIME. 180 capsule 0   tiZANidine (ZANAFLEX) 4 MG tablet Take 1 tablet (4 mg total) by mouth every 6 (six) hours as needed for muscle spasms. 90 tablet 0   traMADol (ULTRAM) 50 MG tablet Take 50 mg daily, can take one other tab during the day if needed     traZODone (DESYREL) 100 MG tablet TAKE 1 TABLET AT BEDTIME 90 tablet 0   allopurinol (ZYLOPRIM) 100 MG tablet TAKE 1 TABLET(100 MG) BY MOUTH DAILY as needed (Patient not taking: Reported on 01/21/2022) 90 tablet 1   diclofenac sodium (VOLTAREN) 1 % GEL Apply 2 g topically 4 (four) times daily. (Patient not taking: Reported on 01/21/2022) 100 g 1   diphenoxylate-atropine (LOMOTIL) 2.5-0.025 MG tablet Take 2 tablets by mouth 4 (four) times daily as needed for diarrhea or loose stools. (Patient not taking: Reported on 01/21/2022) 64 tablet 3   feeding supplement (ENSURE ENLIVE / ENSURE PLUS) LIQD Take 237 mLs by mouth 2 (two) times daily between meals. (Patient not taking: Reported on 01/21/2022) 237 mL 12   fluconazole (DIFLUCAN) 150 MG tablet Take 1 tablet (150 mg) by mouth on day one then repeat dose 3 days later as needed for vaginal yeast infection. (Patient not taking: Reported on 01/21/2022) 2 tablet 0   fluticasone (FLONASE) 50 MCG/ACT nasal spray Place 2 sprays into both nostrils daily. (Patient not taking: Reported on 01/21/2022) 16 g 2   magnesium chloride (SLOW-MAG) 64 MG TBEC SR  tablet Take 1 tablet (64 mg total) by mouth 2 (two) times daily. (Patient not taking: Reported on 01/21/2022) 60 tablet 1   nitroGLYCERIN (NITROSTAT) 0.4 MG SL tablet Place 1 tablet (0.4 mg total) under the tongue every 5 (five) minutes as needed for chest pain. (Patient not taking: Reported on 01/21/2022) 20 tablet 12   promethazine (PHENERGAN) 25 MG tablet Take 1 tablet (25 mg total) by mouth every 6 (six) hours as needed for nausea or vomiting. (Patient not taking: Reported on 01/21/2022) 90 tablet 1   promethazine-dextromethorphan (PROMETHAZINE-DM) 6.25-15 MG/5ML syrup Take 5 mLs by mouth 4 (four) times daily as needed for cough. (Patient not taking: Reported on 01/21/2022) 118 mL 0   No current facility-administered medications for this visit.   Facility-Administered Medications Ordered in Other Visits  Medication Dose Route Frequency Provider Last Rate Last Admin   sodium chloride flush (NS) 0.9 % injection 10 mL  10 mL Intravenous PRN Earlie Server, MD   10 mL at 04/02/21 7867    Review of Systems  Constitutional:  Negative for chills, fever, malaise/fatigue and weight loss.  HENT:  Negative for sore throat.   Eyes:  Negative for redness.  Respiratory:  Negative for cough, shortness of breath and wheezing.   Cardiovascular:  Negative for chest pain, palpitations and leg swelling.  Gastrointestinal:  Positive for diarrhea and nausea. Negative for abdominal pain, blood in stool and vomiting.  Genitourinary:  Negative for dysuria.  Musculoskeletal:  Positive for back pain and joint pain. Negative for myalgias.  Skin:  Negative for rash.  Neurological:  Positive for tingling and sensory change. Negative for dizziness and tremors.  Endo/Heme/Allergies:  Does not bruise/bleed easily.  Psychiatric/Behavioral:  Negative for hallucinations.     Performance status (ECOG): 1  Vitals Blood pressure 120/80, pulse 68, temperature 98.6 F (37 C), resp.  rate 18, weight 155 lb 12.8 oz (70.7  kg).  Physical Exam Vitals and nursing note reviewed.  Constitutional:      General: She is not in acute distress.    Appearance: She is well-developed. She is not ill-appearing, toxic-appearing or diaphoretic.     Interventions: Face mask in place.  HENT:     Head: Normocephalic and atraumatic.     Mouth/Throat:     Mouth: Mucous membranes are moist.     Pharynx: Oropharynx is clear.  Eyes:     General: No scleral icterus.    Pupils: Pupils are equal, round, and reactive to light.  Cardiovascular:     Rate and Rhythm: Normal rate and regular rhythm.     Heart sounds: Normal heart sounds. No murmur heard. Pulmonary:     Effort: Pulmonary effort is normal. No respiratory distress.     Breath sounds: Normal breath sounds. No wheezing or rales.  Chest:     Chest wall: No tenderness.  Abdominal:     General: Bowel sounds are normal. There is no distension.     Palpations: Abdomen is soft. There is no hepatomegaly, splenomegaly or mass.     Tenderness: There is no abdominal tenderness. There is no guarding or rebound.  Musculoskeletal:        General: No tenderness. Normal range of motion.     Cervical back: Normal range of motion and neck supple.     Right lower leg: No edema.     Left lower leg: No edema.  Lymphadenopathy:     Head:     Right side of head: No preauricular, posterior auricular or occipital adenopathy.     Left side of head: No preauricular, posterior auricular or occipital adenopathy.     Cervical: No cervical adenopathy.     Upper Body:     Right upper body: No supraclavicular adenopathy.     Left upper body: No supraclavicular adenopathy.  Skin:    General: Skin is warm and dry.  Neurological:     Mental Status: She is alert and oriented to person, place, and time. Mental status is at baseline.  Psychiatric:        Mood and Affect: Mood normal.    Laboratory data Infusion on 01/21/2022  Component Date Value Ref Range Status   Sodium 01/21/2022 135   135 - 145 mmol/L Final   Potassium 01/21/2022 4.4  3.5 - 5.1 mmol/L Final   Chloride 01/21/2022 109  98 - 111 mmol/L Final   CO2 01/21/2022 22  22 - 32 mmol/L Final   Glucose, Bld 01/21/2022 146 (H)  70 - 99 mg/dL Final   Glucose reference range applies only to samples taken after fasting for at least 8 hours.   BUN 01/21/2022 24 (H)  8 - 23 mg/dL Final   Creatinine, Ser 01/21/2022 1.06 (H)  0.44 - 1.00 mg/dL Final   Calcium 01/21/2022 8.8 (L)  8.9 - 10.3 mg/dL Final   Total Protein 01/21/2022 7.1  6.5 - 8.1 g/dL Final   Albumin 01/21/2022 4.0  3.5 - 5.0 g/dL Final   AST 01/21/2022 45 (H)  15 - 41 U/L Final   ALT 01/21/2022 48 (H)  0 - 44 U/L Final   Alkaline Phosphatase 01/21/2022 93  38 - 126 U/L Final   Total Bilirubin 01/21/2022 0.4  0.3 - 1.2 mg/dL Final   GFR, Estimated 01/21/2022 58 (L)  >60 mL/min Final   Comment: (NOTE) Calculated using the CKD-EPI  Creatinine Equation (2021)    Anion gap 01/21/2022 4 (L)  5 - 15 Final   Performed at [facility], [address] [place], [place] [address]   WBC 01/21/2022 6.8  4.0 - 10.5 K/uL Final   RBC 01/21/2022 4.24  3.87 - 5.11 MIL/uL Final   Hemoglobin 01/21/2022 10.8 (L)  12.0 - 15.0 g/dL Final   HCT 01/21/2022 33.7 (L)  36.0 - 46.0 % Final   MCV 01/21/2022 79.5 (L)  80.0 - 100.0 fL Final   MCH 01/21/2022 25.5 (L)  26.0 - 34.0 pg Final   MCHC 01/21/2022 32.0  30.0 - 36.0 g/dL Final   RDW 01/21/2022 14.6  11.5 - 15.5 % Final   Platelets 01/21/2022 124 (L)  150 - 400 K/uL Final   nRBC 01/21/2022 0.0  0.0 - 0.2 % Final   Neutrophils Relative % 01/21/2022 65  % Final   Neutro Abs 01/21/2022 4.3  1.7 - 7.7 K/uL Final   Lymphocytes Relative 01/21/2022 25  % Final   Lymphs Abs 01/21/2022 1.7  0.7 - 4.0 K/uL Final   Monocytes Relative 01/21/2022 8  % Final   Monocytes Absolute 01/21/2022 0.6  0.1 - 1.0 K/uL Final   Eosinophils Relative 01/21/2022 2  % Final   Eosinophils Absolute 01/21/2022 0.2  0.0 - 0.5 K/uL Final   Basophils  Relative 01/21/2022 0  % Final   Basophils Absolute 01/21/2022 0.0  0.0 - 0.1 K/uL Final   Immature Granulocytes 01/21/2022 0  % Final   Abs Immature Granulocytes 01/21/2022 0.02  0.00 - 0.07 K/uL Final   Performed at [facility], [address] [place], [place] [address]   Magnesium 01/21/2022 1.3 (L)  1.7 - 2.4 mg/dL Final   Performed at [facility], [address], [place], [place] [address]    Assessment/Plan 1. Multiple myeloma in remission ([place])   2. Hypomagnesemia   3. B12 deficiency   4. 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 Hypogammaglobulinemia ([place])     # Recurrent lambda light chain multiple myeloma s/p second autologous bone marrow transplant [05/10/2020] Most recent Multiple myeloma showed negative M protein.  Light chain ratio is normal. 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  Attention on follow-up. Labs reviewed and discussed with patient Daratumumab on hold. Continue acyclovir prophylaxis.  -[place] transplant RN coordinator [phone]).  #Hypoglobulinemia post stem cell transplantation.  On IVIG infusion if IgG level less than 500.   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   Follows up with neurology.  Now on nortriptyline.  #chronic diarrhea,antidiarrhea PRN.  #Chronic hypomagnesemia proceed with  IV magnesium today.  Continue weekly magnesium +/- IV magnesium.   Continue Slow-Mag 1 tab every other day.   #Dysphagia, chronic nausea, antiemetics as needed. # B12 deficiency, B12 level has improved.   #CKD, creatinine slightly worse.  Encourage oral hydration.   RTC:  Lab-Mag /IV mag weekly x 3 Lab/MD/Mag in 4 weeks   I discussed the assessment and treatment plan with the patient.  The patient was provided an opportunity to ask questions and all were answered.   The patient agreed with the plan and demonstrated an understanding of the instructions.  The patient was advised to call back if the symptoms worsen or if the condition fails to improve as anticipated.   Earlie Server, MD, PhD 01/21/2022

## 2022-01-21 NOTE — Telephone Encounter (Signed)
Signing encounter , see note 07/27/20

## 2022-01-21 NOTE — Telephone Encounter (Signed)
Signing encounter , see note 07/28/20

## 2022-01-22 ENCOUNTER — Inpatient Hospital Stay: Payer: Medicare HMO | Admitting: Oncology

## 2022-01-22 ENCOUNTER — Inpatient Hospital Stay: Payer: Medicare HMO

## 2022-01-22 DIAGNOSIS — C9000 Multiple myeloma not having achieved remission: Secondary | ICD-10-CM | POA: Diagnosis not present

## 2022-01-22 DIAGNOSIS — Z9484 Stem cells transplant status: Secondary | ICD-10-CM | POA: Diagnosis not present

## 2022-01-22 DIAGNOSIS — Z972 Presence of dental prosthetic device (complete) (partial): Secondary | ICD-10-CM | POA: Diagnosis not present

## 2022-01-22 DIAGNOSIS — Z23 Encounter for immunization: Secondary | ICD-10-CM | POA: Diagnosis not present

## 2022-01-22 LAB — KAPPA/LAMBDA LIGHT CHAINS
Kappa free light chain: 4.7 mg/L (ref 3.3–19.4)
Kappa, lambda light chain ratio: 1.04 (ref 0.26–1.65)
Lambda free light chains: 4.5 mg/L — ABNORMAL LOW (ref 5.7–26.3)

## 2022-01-23 DIAGNOSIS — M545 Low back pain, unspecified: Secondary | ICD-10-CM | POA: Diagnosis not present

## 2022-01-23 DIAGNOSIS — M6281 Muscle weakness (generalized): Secondary | ICD-10-CM | POA: Diagnosis not present

## 2022-01-24 ENCOUNTER — Inpatient Hospital Stay: Payer: Medicare HMO | Admitting: Oncology

## 2022-01-24 ENCOUNTER — Inpatient Hospital Stay: Payer: Medicare HMO

## 2022-01-25 DIAGNOSIS — D630 Anemia in neoplastic disease: Secondary | ICD-10-CM | POA: Diagnosis not present

## 2022-01-25 DIAGNOSIS — D696 Thrombocytopenia, unspecified: Secondary | ICD-10-CM | POA: Diagnosis not present

## 2022-01-25 DIAGNOSIS — D801 Nonfamilial hypogammaglobulinemia: Secondary | ICD-10-CM | POA: Diagnosis not present

## 2022-01-25 DIAGNOSIS — C9000 Multiple myeloma not having achieved remission: Secondary | ICD-10-CM | POA: Diagnosis not present

## 2022-01-25 DIAGNOSIS — D849 Immunodeficiency, unspecified: Secondary | ICD-10-CM | POA: Diagnosis not present

## 2022-01-25 LAB — MULTIPLE MYELOMA PANEL, SERUM
Albumin SerPl Elph-Mcnc: 3.6 g/dL (ref 2.9–4.4)
Albumin/Glob SerPl: 1.4 (ref 0.7–1.7)
Alpha 1: 0.3 g/dL (ref 0.0–0.4)
Alpha2 Glob SerPl Elph-Mcnc: 1 g/dL (ref 0.4–1.0)
B-Globulin SerPl Elph-Mcnc: 0.9 g/dL (ref 0.7–1.3)
Gamma Glob SerPl Elph-Mcnc: 0.5 g/dL (ref 0.4–1.8)
Globulin, Total: 2.6 g/dL (ref 2.2–3.9)
IgA: 13 mg/dL — ABNORMAL LOW (ref 87–352)
IgG (Immunoglobin G), Serum: 504 mg/dL — ABNORMAL LOW (ref 586–1602)
IgM (Immunoglobulin M), Srm: 6 mg/dL — ABNORMAL LOW (ref 26–217)
Total Protein ELP: 6.2 g/dL (ref 6.0–8.5)

## 2022-01-28 ENCOUNTER — Inpatient Hospital Stay: Payer: Medicare HMO

## 2022-01-28 ENCOUNTER — Telehealth: Payer: Self-pay

## 2022-01-28 ENCOUNTER — Encounter: Payer: Self-pay | Admitting: Oncology

## 2022-01-28 DIAGNOSIS — Z9484 Stem cells transplant status: Secondary | ICD-10-CM | POA: Diagnosis not present

## 2022-01-28 DIAGNOSIS — C9001 Multiple myeloma in remission: Secondary | ICD-10-CM | POA: Diagnosis not present

## 2022-01-28 DIAGNOSIS — I350 Nonrheumatic aortic (valve) stenosis: Secondary | ICD-10-CM | POA: Diagnosis not present

## 2022-01-28 DIAGNOSIS — C9000 Multiple myeloma not having achieved remission: Secondary | ICD-10-CM

## 2022-01-28 DIAGNOSIS — D801 Nonfamilial hypogammaglobulinemia: Secondary | ICD-10-CM | POA: Diagnosis not present

## 2022-01-28 DIAGNOSIS — K521 Toxic gastroenteritis and colitis: Secondary | ICD-10-CM | POA: Diagnosis not present

## 2022-01-28 DIAGNOSIS — E538 Deficiency of other specified B group vitamins: Secondary | ICD-10-CM | POA: Diagnosis not present

## 2022-01-28 DIAGNOSIS — N179 Acute kidney failure, unspecified: Secondary | ICD-10-CM | POA: Diagnosis not present

## 2022-01-28 DIAGNOSIS — R197 Diarrhea, unspecified: Secondary | ICD-10-CM | POA: Diagnosis not present

## 2022-01-28 DIAGNOSIS — R110 Nausea: Secondary | ICD-10-CM | POA: Diagnosis not present

## 2022-01-28 LAB — COMPREHENSIVE METABOLIC PANEL
ALT: 48 U/L — ABNORMAL HIGH (ref 0–44)
AST: 51 U/L — ABNORMAL HIGH (ref 15–41)
Albumin: 4.2 g/dL (ref 3.5–5.0)
Alkaline Phosphatase: 93 U/L (ref 38–126)
Anion gap: 9 (ref 5–15)
BUN: 21 mg/dL (ref 8–23)
CO2: 24 mmol/L (ref 22–32)
Calcium: 9.1 mg/dL (ref 8.9–10.3)
Chloride: 103 mmol/L (ref 98–111)
Creatinine, Ser: 1.02 mg/dL — ABNORMAL HIGH (ref 0.44–1.00)
GFR, Estimated: >60 mL/min (ref >60)
Glucose, Bld: 142 mg/dL — ABNORMAL HIGH (ref 70–99)
Potassium: 4 mmol/L (ref 3.5–5.1)
Sodium: 136 mmol/L (ref 135–145)
Total Bilirubin: 0.4 mg/dL (ref 0.3–1.2)
Total Protein: 6.9 g/dL (ref 6.5–8.1)

## 2022-01-28 LAB — CBC WITH DIFFERENTIAL/PLATELET
Abs Immature Granulocytes: 0.03 10*3/uL (ref 0.00–0.07)
Basophils Absolute: 0 10*3/uL (ref 0.0–0.1)
Basophils Relative: 0 %
Eosinophils Absolute: 0.3 10*3/uL (ref 0.0–0.5)
Eosinophils Relative: 4 %
HCT: 32.5 % — ABNORMAL LOW (ref 36.0–46.0)
Hemoglobin: 10.5 g/dL — ABNORMAL LOW (ref 12.0–15.0)
Immature Granulocytes: 1 %
Lymphocytes Relative: 21 %
Lymphs Abs: 1.3 10*3/uL (ref 0.7–4.0)
MCH: 25.2 pg — ABNORMAL LOW (ref 26.0–34.0)
MCHC: 32.3 g/dL (ref 30.0–36.0)
MCV: 78.1 fL — ABNORMAL LOW (ref 80.0–100.0)
Monocytes Absolute: 0.5 10*3/uL (ref 0.1–1.0)
Monocytes Relative: 8 %
Neutro Abs: 3.9 10*3/uL (ref 1.7–7.7)
Neutrophils Relative %: 66 %
Platelets: 130 10*3/uL — ABNORMAL LOW (ref 150–400)
RBC: 4.16 MIL/uL (ref 3.87–5.11)
RDW: 14.9 % (ref 11.5–15.5)
WBC: 5.9 10*3/uL (ref 4.0–10.5)
nRBC: 0 % (ref 0.0–0.2)

## 2022-01-28 LAB — MAGNESIUM: Magnesium: 1.2 mg/dL — ABNORMAL LOW (ref 1.7–2.4)

## 2022-01-28 MED ORDER — MAGNESIUM SULFATE 2 GM/50ML IV SOLN
2.0000 g | Freq: Once | INTRAVENOUS | Status: AC
  Administered 2022-01-28: 2 g via INTRAVENOUS
  Filled 2022-01-28: qty 50

## 2022-01-28 MED ORDER — SODIUM CHLORIDE 0.9 % IV SOLN
25.0000 mg | Freq: Once | INTRAVENOUS | Status: AC
  Administered 2022-01-28: 25 mg via INTRAVENOUS
  Filled 2022-01-28: qty 1

## 2022-01-28 MED ORDER — SODIUM CHLORIDE 0.9 % IV SOLN: 6.0000 g | Freq: Once | INTRAVENOUS | Status: DC

## 2022-01-28 MED ORDER — MAGNESIUM SULFATE 4 GM/100ML IV SOLN
4.0000 g | Freq: Once | INTRAVENOUS | Status: AC
  Administered 2022-01-28: 4 g via INTRAVENOUS
  Filled 2022-01-28: qty 100

## 2022-01-28 MED ORDER — SODIUM CHLORIDE 0.9 % IV SOLN
Freq: Once | INTRAVENOUS | Status: AC
  Filled 2022-01-28: qty 250

## 2022-01-28 MED ORDER — SODIUM CHLORIDE 0.9% FLUSH
10.0000 mL | Freq: Once | INTRAVENOUS | Status: AC
  Administered 2022-01-28: 10 mL via INTRAVENOUS
  Filled 2022-01-28: qty 10

## 2022-01-28 MED ORDER — HEPARIN SOD (PORK) LOCK FLUSH 100 UNIT/ML IV SOLN
500.0000 [IU] | Freq: Once | INTRAVENOUS | Status: AC | PRN
  Administered 2022-01-28: 500 [IU]
  Filled 2022-01-28: qty 5

## 2022-01-28 NOTE — Progress Notes (Signed)
Patient received 6g IV mg today well with IV Phenergan. Nausea improved, no other concerns. See previous note. Patient stable at discharge. Refused AVS .

## 2022-01-28 NOTE — Telephone Encounter (Signed)
-----   Message from Earlie Server, MD sent at 01/26/2022  3:02 PM EDT ----- Please arrange her to get one dose of IVIG. Thanks.

## 2022-01-28 NOTE — Telephone Encounter (Signed)
Please arrange for patient to receive one dose of IVIG. Please notify patient of appt. Thanks

## 2022-01-28 NOTE — Progress Notes (Signed)
Patient here for IV Mg states she forgot about her [facility] apt 8/4 which she is scheduled for IVIG that day & requesting we do IVIG, K+ and Mg all on the same day Mon 7th if K and Mg are needed. Patient asks if she needs an additional 2 g  Mg on top of the 4g/with K+ if she can just wait until next apt time in 1 week. She states she has a full schedule and would like to cut down apt if possible. Charge RN aware. Ok'd since last IVIG only took 4hr and Mg will only be 3 hr max for 6g. Patient K has been stable most likely will not need. MD notified/agrees with plan. Team aware. Apt changed to 8/7 and cancelled 8/4, patient aware and agrees.

## 2022-01-28 NOTE — Patient Instructions (Signed)
[facility]  Discharge Instructions: Thank you for choosing [facility] to provide your oncology and hematology care.  If you have a lab appointment with the Cancer Center, please go directly to the Cancer Center and check in at the registration area.  Wear comfortable clothing and clothing appropriate for easy access to any Portacath or PICC line.   We strive to give you quality time with your provider. You may need to reschedule your appointment if you arrive late (15 or more minutes).  Arriving late affects you and other patients whose appointments are after yours.  Also, if you miss three or more appointments without notifying the office, you may be dismissed from the clinic at the provider's discretion.      For prescription refill requests, have your pharmacy contact our office and allow 72 hours for refills to be completed.       To help prevent nausea and vomiting after your treatment, we encourage you to take your nausea medication as directed.  BELOW ARE SYMPTOMS THAT SHOULD BE REPORTED IMMEDIATELY: *FEVER GREATER THAN 100.4 F (38 C) OR HIGHER *CHILLS OR SWEATING *NAUSEA AND VOMITING THAT IS NOT CONTROLLED WITH YOUR NAUSEA MEDICATION *UNUSUAL SHORTNESS OF BREATH *UNUSUAL BRUISING OR BLEEDING *URINARY PROBLEMS (pain or burning when urinating, or frequent urination) *BOWEL PROBLEMS (unusual diarrhea, constipation, pain near the anus) TENDERNESS IN MOUTH AND THROAT WITH OR WITHOUT PRESENCE OF ULCERS (sore throat, sores in mouth, or a toothache) UNUSUAL RASH, SWELLING OR PAIN  UNUSUAL VAGINAL DISCHARGE OR ITCHING   Items with * indicate a potential emergency and should be followed up as soon as possible or go to the Emergency Department if any problems should occur.  Please show the CHEMOTHERAPY ALERT CARD or IMMUNOTHERAPY ALERT CARD at check-in to the Emergency Department and triage nurse.  Should you have questions after your  visit or need to cancel or reschedule your appointment, please contact [facility]  [phone] and follow the prompts.  Office hours are 8:00 a.m. to 4:30 p.m. Monday - Friday. Please note that voicemails left after 4:00 p.m. may not be returned until the following business day.  We are closed weekends and major holidays. You have access to a nurse at all times for urgent questions. Please call the main number to the clinic [phone] and follow the prompts.  For any non-urgent questions, you may also contact your provider using MyChart. We now offer e-Visits for anyone 18 and older to request care online for non-urgent symptoms. For details visit [email or web address].   Also download the MyChart app! Go to the app store, search "MyChart", open the app, select [facility], and log in with your MyChart username and password.  Masks are optional in the cancer centers. If you would like for your care team to wear a mask while they are taking care of you, please let them know. For doctor visits, patients may have with them one support person who is at least 65 years old. At this time, visitors are not allowed in the infusion area.   

## 2022-01-29 ENCOUNTER — Other Ambulatory Visit: Payer: Self-pay

## 2022-01-29 DIAGNOSIS — M545 Low back pain, unspecified: Secondary | ICD-10-CM | POA: Diagnosis not present

## 2022-01-29 DIAGNOSIS — M6281 Muscle weakness (generalized): Secondary | ICD-10-CM | POA: Diagnosis not present

## 2022-01-31 DIAGNOSIS — M6281 Muscle weakness (generalized): Secondary | ICD-10-CM | POA: Diagnosis not present

## 2022-01-31 DIAGNOSIS — M545 Low back pain, unspecified: Secondary | ICD-10-CM | POA: Diagnosis not present

## 2022-02-01 ENCOUNTER — Ambulatory Visit: Payer: Medicare HMO

## 2022-02-01 DIAGNOSIS — I493 Ventricular premature depolarization: Secondary | ICD-10-CM | POA: Diagnosis not present

## 2022-02-01 DIAGNOSIS — Z885 Allergy status to narcotic agent status: Secondary | ICD-10-CM | POA: Diagnosis not present

## 2022-02-01 DIAGNOSIS — I350 Nonrheumatic aortic (valve) stenosis: Secondary | ICD-10-CM | POA: Diagnosis not present

## 2022-02-01 DIAGNOSIS — Z886 Allergy status to analgesic agent status: Secondary | ICD-10-CM | POA: Diagnosis not present

## 2022-02-01 DIAGNOSIS — Z87891 Personal history of nicotine dependence: Secondary | ICD-10-CM | POA: Diagnosis not present

## 2022-02-01 DIAGNOSIS — C9002 Multiple myeloma in relapse: Secondary | ICD-10-CM | POA: Diagnosis not present

## 2022-02-01 DIAGNOSIS — I471 Supraventricular tachycardia: Secondary | ICD-10-CM | POA: Diagnosis not present

## 2022-02-04 ENCOUNTER — Other Ambulatory Visit: Payer: Self-pay

## 2022-02-04 ENCOUNTER — Emergency Department: Payer: Medicare HMO

## 2022-02-04 ENCOUNTER — Observation Stay
Admission: EM | Admit: 2022-02-04 | Discharge: 2022-02-05 | Disposition: A | Discharge status: Home / Self Care | Payer: Medicare HMO | Attending: Internal Medicine | Admitting: Internal Medicine

## 2022-02-04 ENCOUNTER — Ambulatory Visit: Payer: Medicare HMO

## 2022-02-04 ENCOUNTER — Inpatient Hospital Stay: Payer: Medicare HMO

## 2022-02-04 ENCOUNTER — Inpatient Hospital Stay: Payer: Medicare HMO | Attending: Oncology

## 2022-02-04 VITALS — BP 150/75 | HR 50 | Temp 97.9°F | Resp 17

## 2022-02-04 DIAGNOSIS — C9000 Multiple myeloma not having achieved remission: Secondary | ICD-10-CM

## 2022-02-04 DIAGNOSIS — R0602 Shortness of breath: Secondary | ICD-10-CM | POA: Insufficient documentation

## 2022-02-04 DIAGNOSIS — Z6825 Body mass index (BMI) 25.0-25.9, adult: Secondary | ICD-10-CM | POA: Diagnosis not present

## 2022-02-04 DIAGNOSIS — Z832 Family history of diseases of the blood and blood-forming organs and certain disorders involving the immune mechanism: Secondary | ICD-10-CM | POA: Insufficient documentation

## 2022-02-04 DIAGNOSIS — I493 Ventricular premature depolarization: Secondary | ICD-10-CM | POA: Diagnosis not present

## 2022-02-04 DIAGNOSIS — I10 Essential (primary) hypertension: Secondary | ICD-10-CM | POA: Diagnosis not present

## 2022-02-04 DIAGNOSIS — I5043 Acute on chronic combined systolic (congestive) and diastolic (congestive) heart failure: Secondary | ICD-10-CM | POA: Diagnosis not present

## 2022-02-04 DIAGNOSIS — M2550 Pain in unspecified joint: Secondary | ICD-10-CM | POA: Diagnosis not present

## 2022-02-04 DIAGNOSIS — K219 Gastro-esophageal reflux disease without esophagitis: Secondary | ICD-10-CM | POA: Diagnosis not present

## 2022-02-04 DIAGNOSIS — Z886 Allergy status to analgesic agent status: Secondary | ICD-10-CM | POA: Insufficient documentation

## 2022-02-04 DIAGNOSIS — K529 Noninfective gastroenteritis and colitis, unspecified: Secondary | ICD-10-CM | POA: Insufficient documentation

## 2022-02-04 DIAGNOSIS — Z801 Family history of malignant neoplasm of trachea, bronchus and lung: Secondary | ICD-10-CM | POA: Insufficient documentation

## 2022-02-04 DIAGNOSIS — Z885 Allergy status to narcotic agent status: Secondary | ICD-10-CM | POA: Diagnosis not present

## 2022-02-04 DIAGNOSIS — J811 Chronic pulmonary edema: Secondary | ICD-10-CM | POA: Diagnosis not present

## 2022-02-04 DIAGNOSIS — E1122 Type 2 diabetes mellitus with diabetic chronic kidney disease: Secondary | ICD-10-CM | POA: Diagnosis not present

## 2022-02-04 DIAGNOSIS — Z79899 Other long term (current) drug therapy: Secondary | ICD-10-CM | POA: Diagnosis not present

## 2022-02-04 DIAGNOSIS — D801 Nonfamilial hypogammaglobulinemia: Secondary | ICD-10-CM | POA: Diagnosis not present

## 2022-02-04 DIAGNOSIS — C9001 Multiple myeloma in remission: Secondary | ICD-10-CM | POA: Diagnosis present

## 2022-02-04 DIAGNOSIS — I249 Acute ischemic heart disease, unspecified: Secondary | ICD-10-CM | POA: Insufficient documentation

## 2022-02-04 DIAGNOSIS — D709 Neutropenia, unspecified: Secondary | ICD-10-CM | POA: Insufficient documentation

## 2022-02-04 DIAGNOSIS — B952 Enterococcus as the cause of diseases classified elsewhere: Secondary | ICD-10-CM | POA: Diagnosis not present

## 2022-02-04 DIAGNOSIS — Z808 Family history of malignant neoplasm of other organs or systems: Secondary | ICD-10-CM | POA: Insufficient documentation

## 2022-02-04 DIAGNOSIS — K760 Fatty (change of) liver, not elsewhere classified: Secondary | ICD-10-CM | POA: Diagnosis present

## 2022-02-04 DIAGNOSIS — R079 Chest pain, unspecified: Secondary | ICD-10-CM | POA: Insufficient documentation

## 2022-02-04 DIAGNOSIS — Z87891 Personal history of nicotine dependence: Secondary | ICD-10-CM | POA: Insufficient documentation

## 2022-02-04 DIAGNOSIS — N183 Chronic kidney disease, stage 3 unspecified: Secondary | ICD-10-CM | POA: Insufficient documentation

## 2022-02-04 DIAGNOSIS — G620 Drug-induced polyneuropathy: Secondary | ICD-10-CM | POA: Diagnosis not present

## 2022-02-04 DIAGNOSIS — I350 Nonrheumatic aortic (valve) stenosis: Secondary | ICD-10-CM

## 2022-02-04 DIAGNOSIS — Z9484 Stem cells transplant status: Secondary | ICD-10-CM | POA: Insufficient documentation

## 2022-02-04 DIAGNOSIS — Z9481 Bone marrow transplant status: Secondary | ICD-10-CM | POA: Insufficient documentation

## 2022-02-04 DIAGNOSIS — E1169 Type 2 diabetes mellitus with other specified complication: Secondary | ICD-10-CM | POA: Diagnosis not present

## 2022-02-04 DIAGNOSIS — Z803 Family history of malignant neoplasm of breast: Secondary | ICD-10-CM | POA: Insufficient documentation

## 2022-02-04 DIAGNOSIS — R0789 Other chest pain: Secondary | ICD-10-CM | POA: Diagnosis not present

## 2022-02-04 DIAGNOSIS — E538 Deficiency of other specified B group vitamins: Secondary | ICD-10-CM | POA: Insufficient documentation

## 2022-02-04 DIAGNOSIS — Z800 Family history of malignant neoplasm of digestive organs: Secondary | ICD-10-CM | POA: Insufficient documentation

## 2022-02-04 DIAGNOSIS — Z833 Family history of diabetes mellitus: Secondary | ICD-10-CM | POA: Insufficient documentation

## 2022-02-04 DIAGNOSIS — Z822 Family history of deafness and hearing loss: Secondary | ICD-10-CM | POA: Insufficient documentation

## 2022-02-04 DIAGNOSIS — M549 Dorsalgia, unspecified: Secondary | ICD-10-CM | POA: Diagnosis not present

## 2022-02-04 DIAGNOSIS — Z888 Allergy status to other drugs, medicaments and biological substances status: Secondary | ICD-10-CM | POA: Insufficient documentation

## 2022-02-04 DIAGNOSIS — I5032 Chronic diastolic (congestive) heart failure: Secondary | ICD-10-CM | POA: Insufficient documentation

## 2022-02-04 DIAGNOSIS — I5023 Acute on chronic systolic (congestive) heart failure: Secondary | ICD-10-CM | POA: Diagnosis not present

## 2022-02-04 DIAGNOSIS — Z79624 Long term (current) use of inhibitors of nucleotide synthesis: Secondary | ICD-10-CM | POA: Insufficient documentation

## 2022-02-04 DIAGNOSIS — E663 Overweight: Secondary | ICD-10-CM | POA: Diagnosis not present

## 2022-02-04 DIAGNOSIS — E118 Type 2 diabetes mellitus with unspecified complications: Secondary | ICD-10-CM | POA: Diagnosis present

## 2022-02-04 DIAGNOSIS — E785 Hyperlipidemia, unspecified: Secondary | ICD-10-CM | POA: Diagnosis not present

## 2022-02-04 DIAGNOSIS — T451X5A Adverse effect of antineoplastic and immunosuppressive drugs, initial encounter: Secondary | ICD-10-CM | POA: Diagnosis not present

## 2022-02-04 DIAGNOSIS — I130 Hypertensive heart and chronic kidney disease with heart failure and stage 1 through stage 4 chronic kidney disease, or unspecified chronic kidney disease: Secondary | ICD-10-CM | POA: Diagnosis not present

## 2022-02-04 DIAGNOSIS — Z8249 Family history of ischemic heart disease and other diseases of the circulatory system: Secondary | ICD-10-CM | POA: Insufficient documentation

## 2022-02-04 DIAGNOSIS — J439 Emphysema, unspecified: Secondary | ICD-10-CM | POA: Diagnosis not present

## 2022-02-04 DIAGNOSIS — Z7984 Long term (current) use of oral hypoglycemic drugs: Secondary | ICD-10-CM | POA: Insufficient documentation

## 2022-02-04 DIAGNOSIS — Z841 Family history of disorders of kidney and ureter: Secondary | ICD-10-CM | POA: Insufficient documentation

## 2022-02-04 DIAGNOSIS — J90 Pleural effusion, not elsewhere classified: Secondary | ICD-10-CM | POA: Diagnosis not present

## 2022-02-04 DIAGNOSIS — Z9049 Acquired absence of other specified parts of digestive tract: Secondary | ICD-10-CM | POA: Insufficient documentation

## 2022-02-04 DIAGNOSIS — I959 Hypotension, unspecified: Secondary | ICD-10-CM | POA: Diagnosis not present

## 2022-02-04 DIAGNOSIS — Z8379 Family history of other diseases of the digestive system: Secondary | ICD-10-CM | POA: Insufficient documentation

## 2022-02-04 LAB — CBC WITH DIFFERENTIAL/PLATELET
Abs Immature Granulocytes: 0.02 10*3/uL (ref 0.00–0.07)
Basophils Absolute: 0 10*3/uL (ref 0.0–0.1)
Basophils Relative: 0 %
Eosinophils Absolute: 0.2 10*3/uL (ref 0.0–0.5)
Eosinophils Relative: 3 %
HCT: 31.8 % — ABNORMAL LOW (ref 36.0–46.0)
Hemoglobin: 10.1 g/dL — ABNORMAL LOW (ref 12.0–15.0)
Immature Granulocytes: 0 %
Lymphocytes Relative: 25 %
Lymphs Abs: 1.7 10*3/uL (ref 0.7–4.0)
MCH: 24.8 pg — ABNORMAL LOW (ref 26.0–34.0)
MCHC: 31.8 g/dL (ref 30.0–36.0)
MCV: 78.1 fL — ABNORMAL LOW (ref 80.0–100.0)
Monocytes Absolute: 0.5 10*3/uL (ref 0.1–1.0)
Monocytes Relative: 8 %
Neutro Abs: 4.3 10*3/uL (ref 1.7–7.7)
Neutrophils Relative %: 64 %
Platelets: 137 10*3/uL — ABNORMAL LOW (ref 150–400)
RBC: 4.07 MIL/uL (ref 3.87–5.11)
RDW: 15 % (ref 11.5–15.5)
WBC: 6.7 10*3/uL (ref 4.0–10.5)
nRBC: 0 % (ref 0.0–0.2)

## 2022-02-04 LAB — COMPREHENSIVE METABOLIC PANEL
ALT: 33 U/L (ref 0–44)
AST: 31 U/L (ref 15–41)
Albumin: 3.9 g/dL (ref 3.5–5.0)
Alkaline Phosphatase: 94 U/L (ref 38–126)
Anion gap: 9 (ref 5–15)
BUN: 24 mg/dL — ABNORMAL HIGH (ref 8–23)
CO2: 24 mmol/L (ref 22–32)
Calcium: 8.8 mg/dL — ABNORMAL LOW (ref 8.9–10.3)
Chloride: 102 mmol/L (ref 98–111)
Creatinine, Ser: 0.97 mg/dL (ref 0.44–1.00)
GFR, Estimated: >60 mL/min (ref >60)
Glucose, Bld: 154 mg/dL — ABNORMAL HIGH (ref 70–99)
Potassium: 3.9 mmol/L (ref 3.5–5.1)
Sodium: 135 mmol/L (ref 135–145)
Total Bilirubin: 0.4 mg/dL (ref 0.3–1.2)
Total Protein: 6.6 g/dL (ref 6.5–8.1)

## 2022-02-04 LAB — VITAMIN B12: Vitamin B-12: 338 pg/mL (ref 180–914)

## 2022-02-04 LAB — POTASSIUM: Potassium: 3.9 mmol/L (ref 3.5–5.1)

## 2022-02-04 LAB — HEPARIN LEVEL (UNFRACTIONATED): Heparin Unfractionated: 0.26 IU/mL — ABNORMAL LOW (ref 0.30–0.70)

## 2022-02-04 LAB — TROPONIN I (HIGH SENSITIVITY)
Troponin I (High Sensitivity): 10 ng/L (ref <18)
Troponin I (High Sensitivity): 22 ng/L — ABNORMAL HIGH (ref <18)
Troponin I (High Sensitivity): 24 ng/L — ABNORMAL HIGH (ref <18)

## 2022-02-04 LAB — PROTIME-INR
INR: 1.1 (ref 0.8–1.2)
Prothrombin Time: 14.2 seconds (ref 11.4–15.2)

## 2022-02-04 LAB — MAGNESIUM: Magnesium: 1 mg/dL — ABNORMAL LOW (ref 1.7–2.4)

## 2022-02-04 LAB — BRAIN NATRIURETIC PEPTIDE: B Natriuretic Peptide: 458.1 pg/mL — ABNORMAL HIGH (ref 0.0–100.0)

## 2022-02-04 LAB — APTT: aPTT: >200 seconds (ref 24–36)

## 2022-02-04 MED ORDER — DULOXETINE HCL 30 MG PO CPEP
60.0000 mg | ORAL_CAPSULE | Freq: Every day | ORAL | Status: DC
  Administered 2022-02-05: 60 mg via ORAL
  Filled 2022-02-04: qty 2

## 2022-02-04 MED ORDER — DIPHENHYDRAMINE HCL 25 MG PO CAPS
25.0000 mg | ORAL_CAPSULE | Freq: Four times a day (QID) | ORAL | Status: DC | PRN
  Administered 2022-02-04: 25 mg via ORAL
  Filled 2022-02-04: qty 1

## 2022-02-04 MED ORDER — SODIUM CHLORIDE 0.9 % IV SOLN: 6.0000 g | Freq: Once | INTRAVENOUS | Status: DC

## 2022-02-04 MED ORDER — HEPARIN BOLUS VIA INFUSION
4000.0000 [IU] | Freq: Once | INTRAVENOUS | Status: AC
  Administered 2022-02-04: 4000 [IU] via INTRAVENOUS
  Filled 2022-02-04: qty 4000

## 2022-02-04 MED ORDER — FUROSEMIDE 10 MG/ML IJ SOLN
20.0000 mg | Freq: Once | INTRAMUSCULAR | Status: AC
  Administered 2022-02-04: 20 mg via INTRAVENOUS
  Filled 2022-02-04: qty 4

## 2022-02-04 MED ORDER — FUROSEMIDE 10 MG/ML IJ SOLN
20.0000 mg | Freq: Two times a day (BID) | INTRAMUSCULAR | Status: DC
  Administered 2022-02-04 – 2022-02-05 (×2): 20 mg via INTRAVENOUS
  Filled 2022-02-04: qty 4
  Filled 2022-02-04: qty 2

## 2022-02-04 MED ORDER — ISOSORBIDE MONONITRATE ER 30 MG PO TB24
30.0000 mg | ORAL_TABLET | Freq: Every day | ORAL | Status: DC
  Administered 2022-02-04 – 2022-02-05 (×2): 30 mg via ORAL
  Filled 2022-02-04 (×3): qty 1

## 2022-02-04 MED ORDER — TRAMADOL HCL 50 MG PO TABS
100.0000 mg | ORAL_TABLET | Freq: Once | ORAL | Status: AC
  Administered 2022-02-05: 100 mg via ORAL
  Filled 2022-02-04: qty 2

## 2022-02-04 MED ORDER — TIZANIDINE HCL 4 MG PO TABS
4.0000 mg | ORAL_TABLET | Freq: Once | ORAL | Status: AC
  Administered 2022-02-05: 4 mg via ORAL
  Filled 2022-02-04: qty 1

## 2022-02-04 MED ORDER — IOHEXOL 350 MG/ML SOLN
75.0000 mL | Freq: Once | INTRAVENOUS | Status: AC | PRN
  Administered 2022-02-04: 75 mL via INTRAVENOUS

## 2022-02-04 MED ORDER — FUROSEMIDE 10 MG/ML IJ SOLN: 20.0000 mg | Freq: Once | INTRAMUSCULAR | Status: DC

## 2022-02-04 MED ORDER — SODIUM CHLORIDE 0.9 % IV SOLN
Freq: Once | INTRAVENOUS | Status: AC
  Filled 2022-02-04: qty 250

## 2022-02-04 MED ORDER — PROMETHAZINE HCL 25 MG PO TABS
25.0000 mg | ORAL_TABLET | Freq: Once | ORAL | Status: AC
  Administered 2022-02-05: 25 mg via ORAL
  Filled 2022-02-04: qty 1

## 2022-02-04 MED ORDER — BISOPROLOL FUMARATE 5 MG PO TABS
2.5000 mg | ORAL_TABLET | Freq: Once | ORAL | Status: AC
  Administered 2022-02-05: 2.5 mg via ORAL
  Filled 2022-02-04: qty 0.5

## 2022-02-04 MED ORDER — FUROSEMIDE 10 MG/ML IJ SOLN
INTRAMUSCULAR | Status: AC
  Administered 2022-02-04: 20 mg via INTRAVENOUS
  Filled 2022-02-04: qty 2

## 2022-02-04 MED ORDER — ASPIRIN 81 MG PO CHEW
324.0000 mg | CHEWABLE_TABLET | Freq: Once | ORAL | Status: AC
Start: 2022-02-04 — End: 2022-02-04
  Administered 2022-02-04: 324 mg via ORAL
  Filled 2022-02-04: qty 4

## 2022-02-04 MED ORDER — HEPARIN SOD (PORK) LOCK FLUSH 100 UNIT/ML IV SOLN
INTRAVENOUS | Status: AC
  Filled 2022-02-04: qty 5

## 2022-02-04 MED ORDER — FUROSEMIDE 10 MG/ML IJ SOLN: 20.0000 mg | Freq: Once | INTRAMUSCULAR | Status: AC

## 2022-02-04 MED ORDER — MAGNESIUM SULFATE 2 GM/50ML IV SOLN: 2.0000 g | Freq: Once | INTRAVENOUS | Status: DC

## 2022-02-04 MED ORDER — MORPHINE SULFATE (PF) 2 MG/ML IV SOLN
2.0000 mg | Freq: Once | INTRAVENOUS | Status: AC
  Administered 2022-02-04: 2 mg via INTRAVENOUS
  Filled 2022-02-04: qty 1

## 2022-02-04 MED ORDER — HEPARIN (PORCINE) 25000 UT/250ML-% IV SOLN
950.0000 [IU]/h | INTRAVENOUS | Status: DC
  Administered 2022-02-04: 800 [IU]/h via INTRAVENOUS
  Administered 2022-02-04 – 2022-02-05 (×2): 950 [IU]/h via INTRAVENOUS
  Filled 2022-02-04: qty 250

## 2022-02-04 MED ORDER — FLUOXETINE HCL 20 MG PO CAPS
40.0000 mg | ORAL_CAPSULE | Freq: Every day | ORAL | Status: DC
  Administered 2022-02-05: 40 mg via ORAL
  Filled 2022-02-04: qty 2

## 2022-02-04 MED ORDER — MAGNESIUM SULFATE 4 GM/100ML IV SOLN: 4.0000 g | Freq: Once | INTRAVENOUS | Status: DC

## 2022-02-04 MED ORDER — ACETAMINOPHEN 325 MG PO TABS
650.0000 mg | ORAL_TABLET | Freq: Four times a day (QID) | ORAL | Status: DC | PRN
  Administered 2022-02-04: 650 mg via ORAL
  Filled 2022-02-04: qty 2

## 2022-02-04 MED ORDER — IMMUNE GLOBULIN (HUMAN) 5 GM/50ML IV SOLN
25.0000 g | Freq: Once | INTRAVENOUS | Status: DC
  Filled 2022-02-04: qty 200

## 2022-02-04 MED ORDER — HEPARIN BOLUS VIA INFUSION
1000.0000 [IU] | INTRAVENOUS | Status: AC
  Administered 2022-02-04: 1000 [IU] via INTRAVENOUS
  Filled 2022-02-04: qty 1000

## 2022-02-04 MED ORDER — SODIUM CHLORIDE 0.9 % IV SOLN
Freq: Once | INTRAVENOUS | Status: DC
  Filled 2022-02-04: qty 250

## 2022-02-04 NOTE — ED Triage Notes (Signed)
Pt brought in by EMS for shortness of breath and chest pain after receiving 1L NS at cancer center. Pt got up to go to bathroom became short of breath and had chest pain. Labs drawn at 10 AM by cancer center, 324 ASA given, 2 mg morphine, and 20 mg Lasix. Pt breath

## 2022-02-04 NOTE — Progress Notes (Signed)
Pt arrived to [facility] to receive IVIG and Magnesium 6g IVPB. Vitals stable and pt resting comfortably upon arrival.   Tylenol 650 mg PO, Benadryl 25 mg PO, and 1 L of NS administered per IVIG order and protocol. 1 L of NS completed at 0944. Pt went to the bathroom after 1 L of NS was completed. Once pt arrived back to her infusion chair, she was very SOB, coughing, grabbing at her chest stating "my chest feels so tight, feels like so much pressure", per pt the chest pain is not radiating anywhere, wheezing/crackles noted, and pt unable to get one sentence out while talking. Pt also stated, " my chest feels like it did in February when I had a heart attack." Vitals taken, 2L oxygen applied, and Draven Natter NP notified immediately. **See flowsheets for vitals** Per pt her chest pain was 10 out of 10. Per Yamilee Harmes NP give Lasix 20 mg IV, morphine 2 mg IV, draw BNP/troponin/CMP, and do an EKG. EKG done at 0958, printed and handed to [place] NP to read. ** See MAR for administration times**   At 1010, pt states that her chest pain is still there not as tight but noticeable and still feels SOB especially with talking and with exertion. At 1030 pt states her "chest pain is getting worse and still having quiet a bit of pressure/tightness."  Pt verbalized that she has her nitro tablets. Pt took one Nitro tablet at 1030, with minimal relief. EMS called for transport. EMS arrived at chairside for transport at 1050. Pt transported to ER by EMS with all patient belongings. Pt's port left accessed for hospital usage, port dressing clean/dry/intact/antimicrobial disc in place.    Cashius Grandstaff [employer]

## 2022-02-04 NOTE — Hospital Course (Signed)
66 year old female with past medical history of multiple myeloma, diabetes mellitus, hypertension and aortic stenosis who was at the cancer center for an infusion of saline and started to have acute shortness of breath and chest tightness.  Patient was sent over to the emergency department for further evaluation.  Chest x-ray noted borderline cardiomegaly with some pulmonary vascular congestion and prominent interstitial markings.  Lab work noteworthy for BNP of 458 and first troponin and repeat troponin minimally elevated at 22.  Cardiology notified who recommended overnight observation.  Patient started on heparin drip.  Patient treated for acute on chronic diastolic heart failure.  Given several doses of IV Lasix and she diuresed over a liter of fluid.  Breathing much more comfortably and chest tightness resolved by 8/8.

## 2022-02-04 NOTE — Assessment & Plan Note (Signed)
Mildly elevated troponins, which may be more consistent with her heart failure.  Cardiology to see.  Started on heparin.  Patient had a cardiac cath done few months ago on 5/3 at [facility] noting: Non flow limiting CAD, Mild aortic stenosis and low normal right heart filling pressure with normal cardiac output. We will defer to cardiology, but would prefer to gently diurese and follow serial troponins.

## 2022-02-04 NOTE — Assessment & Plan Note (Signed)
In review of [facility] cardiology records, felt to be mild to moderate.  Therefore, will be cautious in treating any signs of volume overload.

## 2022-02-04 NOTE — Assessment & Plan Note (Signed)
A1c done 2 months ago notes good control at 6.9.

## 2022-02-04 NOTE — Assessment & Plan Note (Signed)
Mildly elevated BNP of 450.  Echocardiogram notes ejection fraction 35 to 40%.  Patient received several doses of IV Lasix and diuresed over a liter.  She stated that she had lost several pounds during hospitalization.  Will discharge patient on Lasix 20 mg p.o. daily as needed for weight gain of more than 2 pounds per day or 5 pounds over several days.  Patient will start checking

## 2022-02-04 NOTE — H&P (Signed)
History and Physical    Patient: Sarah [name] GUY:[id number] DOB: 12/04/1955 DOA: 02/04/2022 DOS: the patient was seen and examined on 02/04/2022 PCP: Glean Hess, MD  Patient coming from: Home  Chief Complaint:  Chief Complaint  Patient presents with   Chest Pain   HPI: 66 year old female with past medical history of multiple myeloma, diabetes mellitus, hypertension and aortic stenosis who was at the cancer center for an infusion of saline and started to have acute shortness of breath and chest tightness.  Patient was sent over to the emergency department for further evaluation.  Chest x-ray noted borderline cardiomegaly with some pulmonary vascular congestion and prominent interstitial markings.  Lab work noteworthy for BNP of 458 and first troponin and repeat troponin minimally elevated at 22.  Cardiology notified who recommended overnight observation.  Patient started on heparin drip.  Review of Systems: Patient denies any some chest tightness and palpitations with associated shortness of breath and nonproductive cough.  She suffers from chronic diarrhea as well as lower extremity neuropathy.  Review of systems otherwise negative. Past Medical History:  Diagnosis Date   Anemia    Anxiety    Aortic stenosis    a. 05/2020 Echo: EF 45-50%, sev AS - seen by TAVR team @ [facility] - CTA sugg of tricuspid valve w/ fusing of 2 leaflets-TAVR deferred in setting of acute infxn; b. 07/2020 Echo: EF 50-55%, mod-sev AS; c. 12/2020 Echo: EF>55%. Mod-sev paradoxical low-flow low-gradient AS; d. 08/2021 Echo: EF 35-40%, severe AS, triv AI, mild MR.   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 Variable EF over time; b. 08/2017 Echo: EF 40%; b. 03/2020 Echo: EF 55-60%; c. 05/2020 Echo: EF 45-50%; d. 07/2020 Echo: EF 50-55%; e. 12/2020 Echo: EF>55%; f. 08/2021 Echo: EF 35-40%.   Chronic heart  failure with preserved ejection fraction (HFpEF) ([place])    a. Variable EF over time; b. 08/2017 Echo: EF 40%; b. 03/2020 Echo: EF 55-60%; c. 05/2020 Echo: EF 45-50%; d. 07/2020 Echo: EF 50-55%; e. 12/2020 Echo: EF>55%; f. 08/2021 Echo: EF 35-40%, no RV, mildly dil LA, mild MR, triv AI, severe AS.   CKD (chronic kidney disease) stage 3, GFR 30-59 ml/min (HCC)    Depression    Diabetes mellitus (HCC)    Dizziness    Fatty liver    Frequent falls    GERD (gastroesophageal reflux disease)    Gout    Heart murmur    History of blood transfusion    History of bone marrow transplant ([place])    History of uterine fibroid    Hx of cardiac catheterization    a. 01/2016 Cath [facility] - after abnl nuc): Nl cors.   Hypertension    Hypomagnesemia    IDA (iron deficiency anemia)    Multiple myeloma (HCC)    Personal history of chemotherapy    PSVT (paroxysmal supraventricular tachycardia) ([place])    a. S/p [facility]).   PVC's (premature ventricular contractions)    a. Well-managed w/ bisoprolol in outpt setting.   Renal cyst    Past Surgical History:  Procedure Laterality Date   ABDOMINAL HYSTERECTOMY     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   CARDIAC ELECTROPHYSIOLOGY MAPPING AND ABLATION     CARPAL TUNNEL RELEASE Bilateral    CHOLECYSTECTOMY  2008   COLONOSCOPY WITH PROPOFOL N/A 05/07/2017  Procedure: COLONOSCOPY WITH PROPOFOL;  Surgeon: [name], MD;  Location: [facility] ENDOSCOPY;  Service: Gastroenterology;  Laterality: N/A;   COLONOSCOPY WITH PROPOFOL N/A 12/19/2020   Procedure: COLONOSCOPY WITH PROPOFOL;  Surgeon: [name], MD;  Location: [facility] ENDOSCOPY;  Service: Gastroenterology;  Laterality: N/A;   ESOPHAGOGASTRODUODENOSCOPY (EGD) WITH PROPOFOL N/A 05/07/2017   Procedure: ESOPHAGOGASTRODUODENOSCOPY (EGD) WITH PROPOFOL;  Surgeon: [name], MD;  Location: [facility] ENDOSCOPY;  Service: Gastroenterology;  Laterality: N/A;   FOOT SURGERY Bilateral    INCONTINENCE SURGERY  2009   INTERSTIM  IMPLANT PLACEMENT     other     over active bladder   OTHER SURGICAL HISTORY     bladder stimulator    PARTIAL HYSTERECTOMY  03/1996   fibroids   PORTA CATH INSERTION N/A 03/10/2019   Procedure: PORTA CATH INSERTION;  Surgeon: Algernon Huxley, MD;  Location: [facility] LAB;  Service: Cardiovascular;  Laterality: N/A;   TONSILLECTOMY  2007   Social History:  reports that she quit smoking about 30 years ago. Her smoking use included cigarettes. She has a 20.00 pack-year smoking history. She has never used smokeless tobacco. She reports current alcohol use. She reports that she does not use drugs.  Lives at home with family.  Ambulates without assistance.  Allergies  Allergen Reactions   Oxycodone-Acetaminophen Anaphylaxis    Swelling and rash   Celebrex [Celecoxib] Diarrhea   Codeine    Plerixafor     In 2016 during ASCT collection patient developed fever to 103.72F and required hospitalization   Benadryl [Diphenhydramine] Palpitations   Morphine Itching and Rash   Ondansetron Diarrhea   Tylenol [Acetaminophen] Itching and Rash    Family History  Problem Relation Age of Onset   Colon cancer Father    Renal Disease Father    Diabetes Mellitus II Father    Melanoma Paternal Grandmother    Breast cancer Maternal Aunt 24   Anemia Mother    Heart disease Mother    Heart failure Mother    Renal Disease Mother    Congestive Heart Failure Mother    Heart disease Maternal Uncle    Throat cancer Maternal Uncle    Lung cancer Maternal Uncle    Liver disease Maternal Uncle    Heart failure Maternal Uncle    Hearing loss Son 53       Suicide     Prior to Admission medications   Medication Sig Start Date End Date Taking? Authorizing Provider  acyclovir (ZOVIRAX) 400 MG tablet Take 1 tablet (400 mg total) by mouth 2 (two) times daily. 03/28/21  Yes Earlie Server, MD  bisoprolol (ZEBETA) 5 MG tablet Take 0.5 tablets (2.5 mg total) by mouth daily. Patient taking differently: Take 5 mg by  mouth daily. 08/30/21  Yes Verlon Au, NP  diphenoxylate-atropine (LOMOTIL) 2.5-0.025 MG tablet Take 2 tablets by mouth 4 (four) times daily as needed for diarrhea or loose stools. 01/02/22  Yes Borders, Kirt Boys, NP  DULoxetine (CYMBALTA) 60 MG capsule Take 1 capsule (60 mg total) by mouth daily. 12/07/21  Yes Glean Hess, MD  FLUoxetine (PROZAC) 40 MG capsule TAKE 1 CAPSULE EVERY DAY 11/06/21  Yes Glean Hess, MD  glipiZIDE (GLUCOTROL XL) 2.5 MG 24 hr tablet Take 1 tablet (2.5 mg total) by mouth daily with breakfast. 09/20/21  Yes Glean Hess, MD  montelukast (SINGULAIR) 10 MG tablet Take 1 tablet (10 mg total) by mouth See admin instructions. Take 1 tab daily for 2  days after daratumumab treatments 06/06/21  Yes Earlie Server, MD  nitroGLYCERIN (NITROSTAT) 0.4 MG SL tablet Place 1 tablet (0.4 mg total) under the tongue every 5 (five) minutes as needed for chest pain. 08/25/21  Yes Lorella Nimrod, MD  omeprazole (PRILOSEC) 40 MG capsule TAKE 1 CAPSULE (40 MG TOTAL) BY MOUTH IN THE MORNING AND AT BEDTIME. 01/03/22  Yes Glean Hess, MD  promethazine (PHENERGAN) 25 MG tablet Take 1 tablet (25 mg total) by mouth every 6 (six) hours as needed for nausea or vomiting. 08/01/21  Yes Earlie Server, MD  tiZANidine (ZANAFLEX) 4 MG tablet Take 1 tablet (4 mg total) by mouth every 6 (six) hours as needed for muscle spasms. 12/07/21  Yes Glean Hess, MD  traMADol (ULTRAM) 50 MG tablet Take 50 mg by mouth 2 (two) times daily. 09/20/21  Yes [provider]  traZODone (DESYREL) 100 MG tablet TAKE 1 TABLET AT BEDTIME 09/25/21  Yes Glean Hess, MD  allopurinol (ZYLOPRIM) 100 MG tablet TAKE 1 TABLET(100 MG) BY MOUTH DAILY as needed 12/02/18   Glean Hess, MD  diclofenac sodium (VOLTAREN) 1 % GEL Apply 2 g topically 4 (four) times daily. 01/25/19   Gertie Baron, NP  feeding supplement (ENSURE ENLIVE / ENSURE PLUS) LIQD Take 237 mLs by mouth 2 (two) times daily between meals. Patient not  taking: Reported on 01/21/2022 08/26/21   Lorella Nimrod, MD  fluconazole (DIFLUCAN) 150 MG tablet Take 1 tablet (150 mg) by mouth on day one then repeat dose 3 days later as needed for vaginal yeast infection. Patient not taking: Reported on 02/04/2022 08/30/21   Verlon Au, NP  fluticasone [facility]) 50 MCG/ACT nasal spray Place 2 sprays into both nostrils daily. Patient not taking: Reported on 01/21/2022 07/10/18   Glean Hess, MD  glucose blood test strip  09/04/15   [provider]  guaiFENesin-dextromethorphan (ROBITUSSIN DM) 100-10 MG/5ML syrup Take 5 mLs by mouth every 4 (four) hours as needed for cough. 08/25/21   Lorella Nimrod, MD  magnesium chloride (SLOW-MAG) 64 MG TBEC SR tablet Take 1 tablet (64 mg total) by mouth 2 (two) times daily. Patient not taking: Reported on 01/21/2022 08/25/21   Lorella Nimrod, MD  Multiple Vitamin (MULTIVITAMIN) tablet Take 1 tablet by mouth daily.    [provider]  promethazine-dextromethorphan (PROMETHAZINE-DM) 6.25-15 MG/5ML syrup Take 5 mLs by mouth 4 (four) times daily as needed for cough. Patient not taking: Reported on 01/21/2022 01/07/22   Montel Culver, MD    Physical Exam: Vitals:   02/04/22 1503 02/04/22 1512 02/04/22 1515 02/04/22 1530  BP:  (!) 156/132  (!) 167/88  Pulse:  98  99  Resp:  (!) 22  15  Temp: 98.4 F (36.9 C)     TempSrc: Oral     SpO2:  96% 97%   Weight:      Height:       General: Alert and oriented x3, no acute distress HEENT: Normocephalic and atraumatic, mucous memories slightly Neck: Supple, no JVD Cardiovascular: Regular rate and rhythm, S1-S2, occasional ectopic beat, 2 out of 6 systolic ejection murmur Lungs: Decreased breath sounds bibasilar Abdomen: Soft, nontender, nondistended, positive bowel sounds Extremities: No clubbing or cyanosis, trace pitting edema Neuro: No focal deficits, chronic peripheral neuropathy Skin: No skin breaks, tears or lesions Psychiatry: Patient is appropriate,  no evidence of psychoses  Data Reviewed:  Lab work noteworthy for normal renal function, BNP of 458 and troponin of 22  Assessment and Plan: * Chest pain Mildly elevated troponins, which may be more consistent with her heart failure.  Cardiology to see.  Started on heparin.  Patient had a cardiac cath done few months ago on 5/3 at [facility] noting: Non flow limiting CAD, Mild aortic stenosis and low normal right heart filling pressure with normal cardiac output. We will defer to cardiology, but would prefer to gently diurese and follow serial troponins.  Acute on chronic systolic CHF (congestive heart failure) (HCC) Mildly elevated BNP of 450.  Echocardiogram notes ejection fraction 35 to 40%.  Lasix x1 dose.  Multiple myeloma not having achieved remission [facility]) Being followed at cancer center  Nonrheumatic aortic valve stenosis In review of [facility] cardiology records, felt to be mild to moderate.  Therefore, will be cautious in treating any signs of volume overload.  Benign essential HTN Blood pressures slightly elevated, should improve with 1 dose of Lasix.  We will resume her home medications.  Type II diabetes mellitus with complication (HCC) H8O done 2 months ago notes good control at 6.9.  Overweight (BMI 25.0-29.9) Meets criteria BMI greater than 25      Advance Care Planning: Confirmed by patient full code  Consults: Cardiology  Family Communication: Left message for family  Severity of Illness: The appropriate patient status for this patient is OBSERVATION. Observation status is judged to be reasonable and necessary in order to provide the required intensity of service to ensure the patient's safety. The patient's presenting symptoms, physical exam findings, and initial radiographic and laboratory data in the context of their medical condition is felt to place them at decreased risk for further clinical deterioration. Furthermore, it is anticipated that the patient will be  medically stable for discharge from the hospital within 2 midnights of admission.   Author: Annita Brod, MD 02/04/2022 6:24 PM  For on call review [email or web address].

## 2022-02-04 NOTE — ED Notes (Signed)
Patient placed on 2 liters via nasal canula per Dr. [place] Pink

## 2022-02-04 NOTE — ED Triage Notes (Signed)
Pt breathing e/u at this time on room air. MD at bedside.

## 2022-02-04 NOTE — Assessment & Plan Note (Signed)
Being followed at cancer center

## 2022-02-04 NOTE — Plan of Care (Signed)
Critical Lab  - PTT greater than 200.  Dr. informed

## 2022-02-04 NOTE — Progress Notes (Signed)
ANTICOAGULATION CONSULT NOTE - Initial Consult  Pharmacy Consult for heparin drip Indication: chest pain/ACS  Allergies  Allergen Reactions   Oxycodone-Acetaminophen Anaphylaxis    Swelling and rash   Celebrex [Celecoxib] Diarrhea   Codeine    Plerixafor     In 2016 during ASCT collection patient developed fever to 103.50F and required hospitalization   Benadryl [Diphenhydramine] Palpitations   Morphine Itching and Rash   Ondansetron Diarrhea   Tylenol [Acetaminophen] Itching and Rash    Patient Measurements: Height: 5' 2"  (157.5 cm) Weight: 72.5 kg (159 lb 13.3 oz) IBW/kg (Calculated) : 50.1 Heparin Dosing Weight: 65.6 kg  Vital Signs: Temp: 98.4 F (36.9 C) (08/07 1503) Temp Source: Oral (08/07 1503) BP: 129/92 (08/07 1200) Pulse Rate: 92 (08/07 1430)  Labs: Recent Labs    02/04/22 1007 02/04/22 1328  HGB  --  10.1*  HCT  --  31.8*  PLT  --  137*  CREATININE 0.97  --   TROPONINIHS 10 24*    Estimated Creatinine Clearance: 53.9 mL/min (by C-G formula based on SCr of 0.97 mg/dL).   Medical History: Past Medical History:  Diagnosis Date   Anemia    Anxiety    Aortic stenosis    a. 05/2020 Echo: EF 45-50%, sev AS - seen by TAVR team @ [facility] - CTA sugg of tricuspid valve w/ fusing of 2 leaflets-TAVR deferred in setting of acute infxn; b. 07/2020 Echo: EF 50-55%, mod-sev AS; c. 12/2020 Echo: EF>55%. Mod-sev paradoxical low-flow low-gradient AS; d. 08/2021 Echo: EF 35-40%, severe AS, triv AI, mild MR.   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 Variable EF over time; b. 08/2017 Echo: EF 40%; b. 03/2020 Echo: EF 55-60%; c. 05/2020 Echo: EF 45-50%; d. 07/2020 Echo: EF 50-55%; e. 12/2020 Echo: EF>55%; f. 08/2021 Echo: EF 35-40%.   Chronic heart failure with preserved ejection fraction (HFpEF) ([place])    a. Variable EF over time; b. 08/2017 Echo: EF 40%; b.  03/2020 Echo: EF 55-60%; c. 05/2020 Echo: EF 45-50%; d. 07/2020 Echo: EF 50-55%; e. 12/2020 Echo: EF>55%; f. 08/2021 Echo: EF 35-40%, no RV, mildly dil LA, mild MR, triv AI, severe AS.   CKD (chronic kidney disease) stage 3, GFR 30-59 ml/min (HCC)    Depression    Diabetes mellitus (HCC)    Dizziness    Fatty liver    Frequent falls    GERD (gastroesophageal reflux disease)    Gout    Heart murmur    History of blood transfusion    History of bone marrow transplant ([place])    History of uterine fibroid    Hx of cardiac catheterization    a. 01/2016 Cath [facility] - after abnl nuc): Nl cors.   Hypertension    Hypomagnesemia    IDA (iron deficiency anemia)    Multiple myeloma (HCC)    Personal history of chemotherapy    PSVT (paroxysmal supraventricular tachycardia) ([place])    a. S/p [facility]).   PVC's (premature ventricular contractions)    a. Well-managed w/ bisoprolol in outpt setting.   Renal cyst     Medications:  Scheduled:   aspirin  324 mg Oral Once   heparin  4,000 Units Intravenous Once   Infusions:   heparin      Assessment: 66 yo F to start heparin drip for ACS/STEMI, admitted from  cancer center [facility] [place]. No anticoagulation PTA per MEd REc Hgb 10.1  plt 137  INR pending, aPTT   Goal of Therapy:  Heparin level 0.3-0.7 units/ml Monitor platelets by anticoagulation protocol: Yes   Plan:  Give 4000 units bolus x 1 Start heparin infusion at 800 units/hr Check anti-Xa level in 6 hours and daily while on heparin Continue to monitor H&H and platelets  Fortunato Nordin A 02/04/2022,3:18 PM

## 2022-02-04 NOTE — Progress Notes (Signed)
       CROSS COVER NOTE  NAME: Sarah [name] MRN: [id number] DOB : [birth year]    Date of Service   02/04/22  HPI/Events of Note   Request for home meds  Interventions   Plan: Tramadol Phenergan Bisoprolol Zanaflex      This document was prepared using Dragon voice recognition software and may include unintentional dictation errors.  Neomia Glass DNP, MHA, FNP-BC Nurse Practitioner [facility] Pager [phone]

## 2022-02-04 NOTE — Assessment & Plan Note (Signed)
Blood pressures slightly elevated, should improve with 1 dose of Lasix.  We will resume her home medications.

## 2022-02-04 NOTE — Progress Notes (Signed)
ANTICOAGULATION CONSULT NOTE   Pharmacy Consult for heparin drip Indication: chest pain/ACS  Allergies  Allergen Reactions   Oxycodone-Acetaminophen Anaphylaxis    Swelling and rash   Celebrex [Celecoxib] Diarrhea   Codeine    Plerixafor     In 2016 during ASCT collection patient developed fever to 103.42F and required hospitalization   Benadryl [Diphenhydramine] Palpitations   Morphine Itching and Rash   Ondansetron Diarrhea   Tylenol [Acetaminophen] Itching and Rash    Patient Measurements: Height: _0  (157.5 cm) Weight: 68.3 kg (150 lb 9.6 oz) IBW/kg (Calculated) : 50.1 Heparin Dosing Weight: 65.6 kg  Vital Signs: Temp: 98.2 F (36.8 C) (08/07 2116) Temp Source: Oral (08/07 2116) BP: 135/80 (08/07 2116) Pulse Rate: 53 (08/07 2116)  Labs: Recent Labs    02/04/22 1007 02/04/22 1328 02/04/22 1633 02/04/22 2143  HGB  --  10.1*  --   --   HCT  --  31.8*  --   --   PLT  --  137*  --   --   APTT  --   --  >200*  --   LABPROT  --   --  14.2  --   INR  --   --  1.1  --   HEPARINUNFRC  --   --   --  0.26*  CREATININE 0.97  --   --   --   TROPONINIHS 10 22*  24*  --   --      Estimated Creatinine Clearance: 52.4 mL/min (by C-G formula based on SCr of 0.97 mg/dL).   Medical History: Past Medical History:  Diagnosis Date   Anemia    Anxiety    Aortic stenosis    a. 05/2020 Echo: EF 45-50%, sev AS - seen by TAVR team @ [facility] - CTA sugg of tricuspid valve w/ fusing of 2 leaflets-TAVR deferred in setting of acute infxn; b. 07/2020 Echo: EF 50-55%, mod-sev AS; c. 12/2020 Echo: EF>55%. Mod-sev paradoxical low-flow low-gradient AS; d. 08/2021 Echo: EF 35-40%, severe AS, triv AI, mild MR.   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 Variable EF over time; b. 08/2017 Echo: EF 40%; b. 03/2020 Echo: EF 55-60%; c. 05/2020 Echo: EF 45-50%; d. 07/2020 Echo: EF  50-55%; e. 12/2020 Echo: EF>55%; f. 08/2021 Echo: EF 35-40%.   Chronic heart failure with preserved ejection fraction (HFpEF) ([place])    a. Variable EF over time; b. 08/2017 Echo: EF 40%; b. 03/2020 Echo: EF 55-60%; c. 05/2020 Echo: EF 45-50%; d. 07/2020 Echo: EF 50-55%; e. 12/2020 Echo: EF>55%; f. 08/2021 Echo: EF 35-40%, no RV, mildly dil LA, mild MR, triv AI, severe AS.   CKD (chronic kidney disease) stage 3, GFR 30-59 ml/min (HCC)    Depression    Diabetes mellitus (HCC)    Dizziness    Fatty liver    Frequent falls    GERD (gastroesophageal reflux disease)    Gout    Heart murmur    History of blood transfusion    History of bone marrow transplant ([place])    History of uterine fibroid    Hx of cardiac catheterization    a. 01/2016 Cath [facility] - after abnl nuc): Nl cors.   Hypertension    Hypomagnesemia    IDA (iron deficiency anemia)    Multiple myeloma (HCC)    Personal history of  chemotherapy    PSVT (paroxysmal supraventricular tachycardia) ([place])    a. S/p [facility]).   PVC's (premature ventricular contractions)    a. Well-managed w/ bisoprolol in outpt setting.   Renal cyst     Medications:  Scheduled:   [START ON 02/05/2022] DULoxetine  60 mg Oral Daily   [START ON 02/05/2022] FLUoxetine  40 mg Oral Daily   furosemide  20 mg Intravenous BID   isosorbide mononitrate  30 mg Oral Daily   Infusions:   heparin 800 Units/hr (02/04/22 1532)    Assessment: 66 yo F to start heparin drip for ACS/STEMI, admitted from cancer center [place], [place]. No anticoagulation PTA per MEd REc Hgb 10.1  plt 137  INR pending, aPTT   Goal of Therapy:  Heparin level 0.3-0.7 units/ml Monitor platelets by anticoagulation protocol: Yes  0807 2143 HL 0.26   Plan:  Bolus 1000 units x 1 Increase heparin infusion to 950 units/hr Will recheck HL in 6 hr after rate change CBC daily while on heparin  Renda Rolls, PharmD, [facility] 02/04/2022 10:26 PM

## 2022-02-04 NOTE — ED Provider Notes (Signed)
[facility] Provider Note   Event Date/Time   First MD Initiated Contact with Patient 02/04/22 1107     (approximate) History  Chest Pain  HPI Sarah [name] is a 66 y.o. female with a history of multiple myeloma who presents from the cancer center after an infusion of normal saline caused her to have acute shortness of breath with associated chest tightness.  Patient states that she has had similar pains in the past when she was told she had a "heart attack but did not show up on EKG".  Patient endorses associated shortness of breath.  Patient describes this pain as a pressure in the central chest that is worse with exertion ROS: Patient currently denies any vision changes, tinnitus, difficulty speaking, facial droop, sore throat, abdominal pain, nausea/vomiting/diarrhea, dysuria, or weakness/numbness/paresthesias in any extremity   Physical Exam  Triage Vital Signs: ED Triage Vitals [02/04/22 1113]  Enc Vitals Group     BP      Pulse      Resp      Temp      Temp src      SpO2      Weight      Height      Head Circumference      Peak Flow      Pain Score 0     Pain Loc      Pain Edu?      Excl. in [place]?    Most recent vital signs: Vitals:   02/04/22 1503 02/04/22 1512  BP:  (!) 156/132  Pulse:  98  Resp:  (!) 22  Temp: 98.4 F (36.9 C)   SpO2:  96%   General: Awake, oriented x4. CV:  Good peripheral perfusion.  Resp:  Normal effort.  Abd:  No distention.  Other:  Elderly Caucasian female laying in bed in no acute distress ED Results / Procedures / Treatments  Labs (all labs ordered are listed, but only abnormal results are displayed) Labs Reviewed  CBC WITH DIFFERENTIAL/PLATELET - Abnormal; Notable for the following components:      Result Value   Hemoglobin 10.1 (*)    HCT 31.8 (*)    MCV 78.1 (*)    MCH 24.8 (*)    Platelets 137 (*)    All other components within normal limits  TROPONIN I (HIGH SENSITIVITY) - Abnormal; Notable  for the following components:   Troponin I (High Sensitivity) 24 (*)    All other components within normal limits  APTT  PROTIME-INR  TROPONIN I (HIGH SENSITIVITY)   EKG ED ECG REPORT I, Naaman Plummer, the attending physician, personally viewed and interpreted this ECG. Date: 02/04/2022 EKG Time: 1117 Rate: 93 Rhythm: normal sinus rhythm QRS Axis: normal Intervals: normal ST/T Wave abnormalities: normal Narrative Interpretation: no evidence of acute ischemia RADIOLOGY ED MD interpretation: Single view portable chest x-ray interpreted by me and shows prominent interstitial lung markings that are chronic and like represent mild interstitial edema versus sequelae of recurrent bouts of CHF.  There is also borderline cardiomegaly and central pulmonary vascular congestion -Agree with radiology assessment Official radiology report(s): DG Chest Port 1 View  Result Date: 02/04/2022 CLINICAL DATA:  Chest pain. EXAM: PORTABLE CHEST 1 VIEW COMPARISON:  Chest x-ray August 21, 2021. FINDINGS: Chronically prominent interstitial markings diffusely. No confluent consolidation. Borderline enlargement the cardiac silhouette. Central pulmonary vascular congestion. Right IJ approach central venous catheter with the tip projecting at the superior cavoatrial junction. IMPRESSION: 1.  Chronically prominent interstitial lung markings diffusely, which could represent mild interstitial edema versus the sequela of recurrent bouts of CHF. 2. Borderline cardiomegaly and central pulmonary vascular congestion. Electronically Signed   By: Margaretha Sheffield M.D.   On: 02/04/2022 11:44   PROCEDURES: Critical Care performed: Yes, see critical care procedure note(s) .1-3 Lead EKG Interpretation  Performed by: Naaman Plummer, MD Authorized by: Naaman Plummer, MD     Interpretation: abnormal     ECG rate:  92   ECG rate assessment: normal     Rhythm: sinus rhythm     Ectopy: PVCs   Comments:     Left bundle branch  block CRITICAL CARE Performed by: Naaman Plummer  Total critical care time: 31 minutes  Critical care time was exclusive of separately billable procedures and treating other patients.  Critical care was necessary to treat or prevent imminent or life-threatening deterioration.  Critical care was time spent personally by me on the following activities: development of treatment plan with patient and/or surrogate as well as nursing, discussions with consultants, evaluation of patient's response to treatment, examination of patient, obtaining history from patient or surrogate, ordering and performing treatments and interventions, ordering and review of laboratory studies, ordering and review of radiographic studies, pulse oximetry and re-evaluation of patient's condition.  MEDICATIONS ORDERED IN ED: Medications  aspirin chewable tablet 324 mg (has no administration in time range)  heparin bolus via infusion 4,000 Units (has no administration in time range)  heparin ADULT infusion 100 units/mL (25000 units/263m) (has no administration in time range)   IMPRESSION / MDM / ASSESSMENT AND PLAN / ED COURSE  I reviewed the triage vital signs and the nursing notes.                             The patient is on the cardiac monitor to evaluate for evidence of arrhythmia and/or significant heart rate changes. Patient's presentation is most consistent with acute presentation with potential threat to life or bodily function. Workup: ECG, CXR, CBC, BMP, Troponin Findings: ECG: No overt evidence of STEMI. No evidence of Brugadas sign, delta wave, epsilon wave, significantly prolonged QTc, or malignant arrhythmia HS Troponin: Negative x1 Other Labs unremarkable for emergent problems. CXR: Without PTX, PNA, or widened mediastinum Last Stress Test:  2022 Last Heart Catheterization:  2022 HEART Score: 5  Given History, Exam, and Workup I have low suspicion for ACS, Pneumothorax, Pneumonia, Pulmonary  Embolus, Tamponade, Aortic Dissection or other emergent problem as a cause for this presentation.   Patient at increased risk for Major Adverse Cardiac Event (AMI, PCI, CABG, death) Interventions: ASA 3242mHeparin drip  Disposition: Admit for continued cardiac monitoring and trending of troponins as well as further evaluation for potential inpatient stress testing vs cardiac catheterization and coronary angiography.   FINAL CLINICAL IMPRESSION(S) / ED DIAGNOSES   Final diagnoses:  Chest pain, unspecified type  Shortness of breath  ACS (acute coronary syndrome) ([place]  Rx / DC Orders   ED Discharge Orders     None      Note:  This document was prepared using Dragon voice recognition software and may include unintentional dictation errors.   BrNaaman PlummerMD 02/04/22 [phone]

## 2022-02-04 NOTE — Assessment & Plan Note (Signed)
Meets criteria BMI greater than 25 

## 2022-02-05 ENCOUNTER — Encounter: Payer: Self-pay | Admitting: Internal Medicine

## 2022-02-05 DIAGNOSIS — E663 Overweight: Secondary | ICD-10-CM

## 2022-02-05 DIAGNOSIS — C9000 Multiple myeloma not having achieved remission: Secondary | ICD-10-CM | POA: Diagnosis not present

## 2022-02-05 DIAGNOSIS — I10 Essential (primary) hypertension: Secondary | ICD-10-CM | POA: Diagnosis not present

## 2022-02-05 DIAGNOSIS — R0789 Other chest pain: Secondary | ICD-10-CM | POA: Diagnosis not present

## 2022-02-05 DIAGNOSIS — R0602 Shortness of breath: Secondary | ICD-10-CM | POA: Diagnosis not present

## 2022-02-05 DIAGNOSIS — I5023 Acute on chronic systolic (congestive) heart failure: Secondary | ICD-10-CM | POA: Diagnosis not present

## 2022-02-05 LAB — BASIC METABOLIC PANEL
Anion gap: 13 (ref 5–15)
BUN: 26 mg/dL — ABNORMAL HIGH (ref 8–23)
CO2: 25 mmol/L (ref 22–32)
Calcium: 9.2 mg/dL (ref 8.9–10.3)
Chloride: 101 mmol/L (ref 98–111)
Creatinine, Ser: 1.34 mg/dL — ABNORMAL HIGH (ref 0.44–1.00)
GFR, Estimated: 44 mL/min — ABNORMAL LOW (ref >60)
Glucose, Bld: 157 mg/dL — ABNORMAL HIGH (ref 70–99)
Potassium: 3.6 mmol/L (ref 3.5–5.1)
Sodium: 139 mmol/L (ref 135–145)

## 2022-02-05 LAB — KAPPA/LAMBDA LIGHT CHAINS
Kappa free light chain: 3.7 mg/L (ref 3.3–19.4)
Kappa, lambda light chain ratio: 0.7 (ref 0.26–1.65)
Lambda free light chains: 5.3 mg/L — ABNORMAL LOW (ref 5.7–26.3)

## 2022-02-05 LAB — LIPID PANEL
Cholesterol: 175 mg/dL (ref 0–200)
HDL: 67 mg/dL (ref >40)
LDL Cholesterol: 91 mg/dL (ref 0–99)
Total CHOL/HDL Ratio: 2.6 RATIO
Triglycerides: 87 mg/dL (ref <150)
VLDL: 17 mg/dL (ref 0–40)

## 2022-02-05 LAB — CBC
HCT: 34 % — ABNORMAL LOW (ref 36.0–46.0)
Hemoglobin: 10.9 g/dL — ABNORMAL LOW (ref 12.0–15.0)
MCH: 24.5 pg — ABNORMAL LOW (ref 26.0–34.0)
MCHC: 32.1 g/dL (ref 30.0–36.0)
MCV: 76.4 fL — ABNORMAL LOW (ref 80.0–100.0)
Platelets: 160 10*3/uL (ref 150–400)
RBC: 4.45 MIL/uL (ref 3.87–5.11)
RDW: 15 % (ref 11.5–15.5)
WBC: 8.6 10*3/uL (ref 4.0–10.5)
nRBC: 0 % (ref 0.0–0.2)

## 2022-02-05 LAB — GLUCOSE, CAPILLARY
Glucose-Capillary: 234 mg/dL — ABNORMAL HIGH (ref 70–99)
Glucose-Capillary: 141 mg/dL — ABNORMAL HIGH (ref 70–99)

## 2022-02-05 LAB — HEPARIN LEVEL (UNFRACTIONATED)
Heparin Unfractionated: 0.42 IU/mL (ref 0.30–0.70)
Heparin Unfractionated: 0.37 IU/mL (ref 0.30–0.70)

## 2022-02-05 MED ORDER — BISOPROLOL FUMARATE 5 MG PO TABS
2.5000 mg | ORAL_TABLET | Freq: Every day | ORAL | Status: DC
  Administered 2022-02-05: 2.5 mg via ORAL
  Filled 2022-02-05: qty 0.5

## 2022-02-05 MED ORDER — TIZANIDINE HCL 4 MG PO TABS
4.0000 mg | ORAL_TABLET | Freq: Four times a day (QID) | ORAL | Status: DC | PRN
  Filled 2022-02-05: qty 1

## 2022-02-05 MED ORDER — MONTELUKAST SODIUM 10 MG PO TABS
10.0000 mg | ORAL_TABLET | Freq: Every day | ORAL | Status: DC
  Administered 2022-02-05: 10 mg via ORAL
  Filled 2022-02-05: qty 1

## 2022-02-05 MED ORDER — INSULIN ASPART 100 UNIT/ML IJ SOLN: 0.0000 [IU] | Freq: Every day | INTRAMUSCULAR | Status: DC

## 2022-02-05 MED ORDER — ADULT MULTIVITAMIN W/MINERALS CH
1.0000 | ORAL_TABLET | Freq: Every day | ORAL | Status: DC
  Administered 2022-02-05: 1 via ORAL
  Filled 2022-02-05: qty 1

## 2022-02-05 MED ORDER — MAGNESIUM SULFATE 4 GM/100ML IV SOLN
4.0000 g | Freq: Once | INTRAVENOUS | Status: AC
Start: 2022-02-05 — End: 2022-02-05
  Administered 2022-02-05: 4 g via INTRAVENOUS
  Filled 2022-02-05: qty 100

## 2022-02-05 MED ORDER — TRAMADOL HCL 50 MG PO TABS
50.0000 mg | ORAL_TABLET | Freq: Two times a day (BID) | ORAL | Status: DC
  Administered 2022-02-05: 50 mg via ORAL
  Filled 2022-02-05: qty 1

## 2022-02-05 MED ORDER — BISOPROLOL FUMARATE 5 MG PO TABS: 5.0000 mg | ORAL_TABLET | Freq: Every day | ORAL | 2 refills | Status: DC

## 2022-02-05 MED ORDER — FUROSEMIDE 20 MG PO TABS: 20.0000 mg | ORAL_TABLET | Freq: Every day | ORAL | 11 refills | Status: DC | PRN

## 2022-02-05 MED ORDER — ATORVASTATIN CALCIUM 10 MG PO TABS: 5.0000 mg | ORAL_TABLET | Freq: Every day | ORAL | 11 refills | Status: DC

## 2022-02-05 MED ORDER — ACYCLOVIR 200 MG PO CAPS
400.0000 mg | ORAL_CAPSULE | Freq: Two times a day (BID) | ORAL | Status: DC
  Administered 2022-02-05: 400 mg via ORAL
  Filled 2022-02-05: qty 2

## 2022-02-05 MED ORDER — PANTOPRAZOLE SODIUM 40 MG PO TBEC
40.0000 mg | DELAYED_RELEASE_TABLET | Freq: Every day | ORAL | Status: DC
  Administered 2022-02-05: 40 mg via ORAL
  Filled 2022-02-05: qty 1

## 2022-02-05 MED ORDER — INSULIN ASPART 100 UNIT/ML IJ SOLN
0.0000 [IU] | Freq: Three times a day (TID) | INTRAMUSCULAR | Status: DC
  Administered 2022-02-05: 1 [IU] via SUBCUTANEOUS
  Administered 2022-02-05: 3 [IU] via SUBCUTANEOUS
  Filled 2022-02-05 (×2): qty 1

## 2022-02-05 NOTE — Plan of Care (Signed)

## 2022-02-05 NOTE — Progress Notes (Signed)
[place] for heparin drip Indication: chest pain/ACS  Allergies  Allergen Reactions   Oxycodone-Acetaminophen Anaphylaxis    Swelling and rash   Celebrex [Celecoxib] Diarrhea   Codeine    Plerixafor     In 2016 during ASCT collection patient developed fever to 103.37F and required hospitalization   Benadryl [Diphenhydramine] Palpitations   Morphine Itching and Rash   Ondansetron Diarrhea   Tylenol [Acetaminophen] Itching and Rash    Patient Measurements: Height: 5' 2"  (157.5 cm) Weight: 68.3 kg (150 lb 9.6 oz) IBW/kg (Calculated) : 50.1 Heparin Dosing Weight: 65.6 kg  Vital Signs: Temp: 99 F (37.2 C) (08/08 1103) Temp Source: Oral (08/08 1103) BP: 112/70 (08/08 1103) Pulse Rate: 45 (08/08 1103)  Labs: Recent Labs    02/04/22 1007 02/04/22 1328 02/04/22 1633 02/04/22 2143 02/05/22 0618 02/05/22 1236  HGB  --  10.1*  --   --  10.9*  --   HCT  --  31.8*  --   --  34.0*  --   PLT  --  137*  --   --  160  --   APTT  --   --  >200*  --   --   --   LABPROT  --   --  14.2  --   --   --   INR  --   --  1.1  --   --   --   HEPARINUNFRC  --   --   --  0.26* 0.42 0.37  CREATININE 0.97  --   --   --  1.34*  --   TROPONINIHS 10 22*  24*  --   --   --   --      Estimated Creatinine Clearance: 37.9 mL/min (A) (by C-G formula based on SCr of 1.34 mg/dL (H)).   Medical History: Past Medical History:  Diagnosis Date   Anemia    Anxiety    Aortic stenosis    a. 05/2020 Echo: EF 45-50%, sev AS - seen by TAVR team @ [facility] - CTA sugg of tricuspid valve w/ fusing of 2 leaflets-TAVR deferred in setting of acute infxn; b. 07/2020 Echo: EF 50-55%, mod-sev AS; c. 12/2020 Echo: EF>55%. Mod-sev paradoxical low-flow low-gradient AS; d. 08/2021 Echo: EF 35-40%, severe AS, triv AI, mild MR.   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 Variable EF over time; b. 08/2017 Echo: EF 40%; b. 03/2020 Echo: EF 55-60%; c. 05/2020 Echo: EF 45-50%; d. 07/2020 Echo: EF 50-55%; e. 12/2020 Echo: EF>55%; f. 08/2021 Echo: EF 35-40%.   Chronic heart failure with preserved ejection fraction (HFpEF) ([place])    a. Variable EF over time; b. 08/2017 Echo: EF 40%; b. 03/2020 Echo: EF 55-60%; c. 05/2020 Echo: EF 45-50%; d. 07/2020 Echo: EF 50-55%; e. 12/2020 Echo: EF>55%; f. 08/2021 Echo: EF 35-40%, no RV, mildly dil LA, mild MR, triv AI, severe AS.   CKD (chronic kidney disease) stage 3, GFR 30-59 ml/min (HCC)    Depression    Diabetes mellitus (HCC)    Dizziness    Fatty liver    Frequent falls    GERD (gastroesophageal reflux disease)    Gout    Heart murmur    History of blood transfusion    History of bone marrow transplant ([place])    History of  uterine fibroid    Hx of cardiac catheterization    a. 01/2016 Cath [facility] - after abnl nuc): Nl cors.   Hypertension    Hypomagnesemia    IDA (iron deficiency anemia)    Multiple myeloma (HCC)    Personal history of chemotherapy    PSVT (paroxysmal supraventricular tachycardia) ([place])    a. S/p [facility]).   PVC's (premature ventricular contractions)    a. Well-managed w/ bisoprolol in outpt setting.   Renal cyst     Medications:  Scheduled:   acyclovir  400 mg Oral BID   bisoprolol  2.5 mg Oral Daily   DULoxetine  60 mg Oral Daily   FLUoxetine  40 mg Oral Daily   insulin aspart  0-5 Units Subcutaneous QHS   insulin aspart  0-9 Units Subcutaneous TID WC   isosorbide mononitrate  30 mg Oral Daily   montelukast  10 mg Oral Daily   multivitamin with minerals  1 tablet Oral Daily   pantoprazole  40 mg Oral Daily   traMADol  50 mg Oral BID   Infusions:   heparin 950 Units/hr (02/05/22 1119)    Assessment: 66 yo F to start heparin drip for ACS/STEMI, admitted from cancer center [place], [place]. No anticoagulation PTA per MEd REc Hgb 10.1  plt 137  INR pending, aPTT   Goal of  Therapy:  Heparin level 0.3-0.7 units/ml Monitor platelets by anticoagulation protocol: Yes  Date Time HL Rate/Comment 0807 2143 0.26 0808  0618  0.42  therapeutic x 1 0808 1236 0.37 therapeutic x 2   Plan:  Continue heparin infusion at 950 units/hr Will recheck HL tomorrow moring with AM labs CBC daily while on heparin  [place] Pharmacist 02/05/2022 1:36 PM

## 2022-02-05 NOTE — Progress Notes (Signed)
[place] for heparin drip Indication: chest pain/ACS  Allergies  Allergen Reactions   Oxycodone-Acetaminophen Anaphylaxis    Swelling and rash   Celebrex [Celecoxib] Diarrhea   Codeine    Plerixafor     In 2016 during ASCT collection patient developed fever to 103.48F and required hospitalization   Benadryl [Diphenhydramine] Palpitations   Morphine Itching and Rash   Ondansetron Diarrhea   Tylenol [Acetaminophen] Itching and Rash    Patient Measurements: Height: 5' 2"  (157.5 cm) Weight: 68.3 kg (150 lb 9.6 oz) IBW/kg (Calculated) : 50.1 Heparin Dosing Weight: 65.6 kg  Vital Signs: Temp: 98.3 F (36.8 C) (08/08 0342) Temp Source: Oral (08/08 0342) BP: 102/66 (08/08 0342) Pulse Rate: 77 (08/08 0342)  Labs: Recent Labs    02/04/22 1007 02/04/22 1328 02/04/22 1633 02/04/22 2143 02/05/22 0618  HGB  --  10.1*  --   --  10.9*  HCT  --  31.8*  --   --  34.0*  PLT  --  137*  --   --  160  APTT  --   --  >200*  --   --   LABPROT  --   --  14.2  --   --   INR  --   --  1.1  --   --   HEPARINUNFRC  --   --   --  0.26* 0.42  CREATININE 0.97  --   --   --   --   TROPONINIHS 10 22*  24*  --   --   --      Estimated Creatinine Clearance: 52.4 mL/min (by C-G formula based on SCr of 0.97 mg/dL).   Medical History: Past Medical History:  Diagnosis Date   Anemia    Anxiety    Aortic stenosis    a. 05/2020 Echo: EF 45-50%, sev AS - seen by TAVR team @ [facility] - CTA sugg of tricuspid valve w/ fusing of 2 leaflets-TAVR deferred in setting of acute infxn; b. 07/2020 Echo: EF 50-55%, mod-sev AS; c. 12/2020 Echo: EF>55%. Mod-sev paradoxical low-flow low-gradient AS; d. 08/2021 Echo: EF 35-40%, severe AS, triv AI, mild MR.   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 Variable EF over time; b. 08/2017 Echo: EF 40%; b. 03/2020  Echo: EF 55-60%; c. 05/2020 Echo: EF 45-50%; d. 07/2020 Echo: EF 50-55%; e. 12/2020 Echo: EF>55%; f. 08/2021 Echo: EF 35-40%.   Chronic heart failure with preserved ejection fraction (HFpEF) ([place])    a. Variable EF over time; b. 08/2017 Echo: EF 40%; b. 03/2020 Echo: EF 55-60%; c. 05/2020 Echo: EF 45-50%; d. 07/2020 Echo: EF 50-55%; e. 12/2020 Echo: EF>55%; f. 08/2021 Echo: EF 35-40%, no RV, mildly dil LA, mild MR, triv AI, severe AS.   CKD (chronic kidney disease) stage 3, GFR 30-59 ml/min (HCC)    Depression    Diabetes mellitus (HCC)    Dizziness    Fatty liver    Frequent falls    GERD (gastroesophageal reflux disease)    Gout    Heart murmur    History of blood transfusion    History of bone marrow transplant ([place])    History of uterine fibroid    Hx of cardiac catheterization    a. 01/2016 Cath [facility] - after abnl nuc): Nl cors.   Hypertension  Hypomagnesemia    IDA (iron deficiency anemia)    Multiple myeloma (HCC)    Personal history of chemotherapy    PSVT (paroxysmal supraventricular tachycardia) ([place])    a. S/p [facility]).   PVC's (premature ventricular contractions)    a. Well-managed w/ bisoprolol in outpt setting.   Renal cyst     Medications:  Scheduled:   DULoxetine  60 mg Oral Daily   FLUoxetine  40 mg Oral Daily   furosemide  20 mg Intravenous BID   isosorbide mononitrate  30 mg Oral Daily   Infusions:   heparin 950 Units/hr (02/04/22 2254)    Assessment: 66 yo F to start heparin drip for ACS/STEMI, admitted from cancer center [place], [place]. No anticoagulation PTA per MEd REc Hgb 10.1  plt 137  INR pending, aPTT   Goal of Therapy:  Heparin level 0.3-0.7 units/ml Monitor platelets by anticoagulation protocol: Yes  0807 2143 HL 0.26 0808 0618 HL 0.42, therapeutic x 1   Plan:  Continue heparin infusion at 950 units/hr Will recheck HL in 6 hr to confirm CBC daily while on heparin  Renda Rolls, PharmD, [facility] 02/05/2022 7:04 AM

## 2022-02-05 NOTE — Discharge Instructions (Signed)
Weigh yourself every morning (before shower or breakfast) and keep log.  If >2 pounds in a day or >5 pounds over several days, call [facility] Cardiology and take one dose of Lasix

## 2022-02-06 NOTE — Assessment & Plan Note (Signed)
Patient not on a statin.  Discussed with patient and she complains of some muscle aches.  Was willing to try again as it had been sometime.  Fasting LDL at 91 with a target below 70.  Patient started on Lipitor 5 mg p.o. daily.  She will follow-up with her PCP and repeat fasting lipid panel in the next 3 to 6 months.

## 2022-02-06 NOTE — Discharge Summary (Signed)
Physician Discharge Summary   Patient: Sarah [name] MRN: [id number] DOB: 07-09-1955  Admit date:     02/04/2022  Discharge date: 02/05/2022  Discharge Physician: Annita Brod   PCP: Glean Hess, MD   Recommendations at discharge:   New medication: Lipitor 5 mg p.o. daily New medication: Lasix 20 mg p.o. daily as needed for weight gain of more than 2 pounds in 1 day or more than 5 pounds over several days  Discharge Diagnoses: Active Problems:   Multiple myeloma not having achieved remission (HCC)   Nonrheumatic aortic valve stenosis   Benign essential HTN   Type II diabetes mellitus with complication (HCC)   Hyperlipidemia associated with type 2 diabetes mellitus (HCC)   Hepatic steatosis   Overweight (BMI 25.0-29.9)  Principal Problem (Resolved):   Chest pain Resolved Problems:   Acute on chronic systolic CHF (congestive heart failure) [facility])  Hospital Course: 66 year old female with past medical history of multiple myeloma, diabetes mellitus, hypertension and aortic stenosis who was at the cancer center for an infusion of saline and started to have acute shortness of breath and chest tightness.  Patient was sent over to the emergency department for further evaluation.  Chest x-ray noted borderline cardiomegaly with some pulmonary vascular congestion and prominent interstitial markings.  Lab work noteworthy for BNP of 458 and first troponin and repeat troponin minimally elevated at 22.  Cardiology notified who recommended overnight observation.  Patient started on heparin drip.  Patient treated for acute on chronic diastolic heart failure.  Given several doses of IV Lasix and she diuresed over a liter of fluid.  Breathing much more comfortably and chest tightness resolved by 8/8.  Assessment and Plan: * Chest pain-resolved as of 02/06/2022 Mildly elevated troponins, felt to be secondary to heart failure.  Patient had a cardiac cath done few months ago on 5/3 at [facility]  noting: Non flow limiting CAD, Mild aortic stenosis and low normal right heart filling pressure with normal cardiac output.    Serial troponins with no increase.  Chest tightness resolved with diuresis.  Acute on chronic systolic CHF (congestive heart failure) (HCC)-resolved as of 02/06/2022 Mildly elevated BNP of 450.  Echocardiogram notes ejection fraction 35 to 40%.  Patient received several doses of IV Lasix and diuresed over a liter.  She stated that she had lost several pounds during hospitalization.  Will discharge patient on Lasix 20 mg p.o. daily as needed for weight gain of more than 2 pounds per day or 5 pounds over several days.  Patient will start checking  Multiple myeloma not having achieved remission [facility]) Being followed at cancer center  Nonrheumatic aortic valve stenosis In review of [facility] cardiology records, felt to be mild to moderate.  Therefore, will be cautious in treating any signs of volume overload.  Benign essential HTN Blood pressures slightly elevated, normalized following diuresis  Type II diabetes mellitus with complication (HCC) T3M done 2 months ago notes good control at 6.9.  Hyperlipidemia associated with type 2 diabetes mellitus ([place]) Patient not on a statin.  Discussed with patient and she complains of some muscle aches.  Was willing to try again as it had been sometime.  Fasting LDL at 91 with a target below 70.  Patient started on Lipitor 5 mg p.o. daily.  She will follow-up with her PCP and repeat fasting lipid panel in the next 3 to 6 months.  Overweight (BMI 25.0-29.9) Meets criteria BMI greater than 25  Consultants: None Procedures performed: None Disposition: Home Diet recommendation:  Discharge Diet Orders (From admission, onward)     Start     Ordered   02/05/22 0000  Diet - low sodium heart healthy        02/05/22 1628           Cardiac diet DISCHARGE MEDICATION: Allergies as of 02/05/2022       Reactions    Oxycodone-acetaminophen Anaphylaxis   Swelling and rash   Celebrex [celecoxib] Diarrhea   Codeine    Plerixafor    In 2016 during ASCT collection patient developed fever to 103.72F and required hospitalization   Benadryl [diphenhydramine] Palpitations   Morphine Itching, Rash   Ondansetron Diarrhea   Tylenol [acetaminophen] Itching, Rash        Medication List     TAKE these medications    acyclovir 400 MG tablet Commonly known as: ZOVIRAX Take 1 tablet (400 mg total) by mouth 2 (two) times daily.   allopurinol 100 MG tablet Commonly known as: ZYLOPRIM TAKE 1 TABLET(100 MG) BY MOUTH DAILY as needed   atorvastatin 10 MG tablet Commonly known as: Lipitor Take 0.5 tablets (5 mg total) by mouth daily.   bisoprolol 5 MG tablet Commonly known as: ZEBETA Take 1 tablet (5 mg total) by mouth daily.   diclofenac sodium 1 % Gel Commonly known as: VOLTAREN Apply 2 g topically 4 (four) times daily.   diphenoxylate-atropine 2.5-0.025 MG tablet Commonly known as: LOMOTIL Take 2 tablets by mouth 4 (four) times daily as needed for diarrhea or loose stools.   DULoxetine 60 MG capsule Commonly known as: Cymbalta Take 1 capsule (60 mg total) by mouth daily.   FLUoxetine 40 MG capsule Commonly known as: PROZAC TAKE 1 CAPSULE EVERY DAY   furosemide 20 MG tablet Commonly known as: Lasix Take 1 tablet (20 mg total) by mouth daily as needed for edema (If > 2 pounds in one day or >5 pounds over several days).   glipiZIDE 2.5 MG 24 hr tablet Commonly known as: GLUCOTROL XL Take 1 tablet (2.5 mg total) by mouth daily with breakfast.   glucose blood test strip   guaiFENesin-dextromethorphan 100-10 MG/5ML syrup Commonly known as: ROBITUSSIN DM Take 5 mLs by mouth every 4 (four) hours as needed for cough.   montelukast 10 MG tablet Commonly known as: Singulair Take 1 tablet (10 mg total) by mouth See admin instructions. Take 1 tab daily for 2 days after daratumumab treatments    multivitamin tablet Take 1 tablet by mouth daily.   nitroGLYCERIN 0.4 MG SL tablet Commonly known as: NITROSTAT Place 1 tablet (0.4 mg total) under the tongue every 5 (five) minutes as needed for chest pain.   omeprazole 40 MG capsule Commonly known as: PRILOSEC TAKE 1 CAPSULE (40 MG TOTAL) BY MOUTH IN THE MORNING AND AT BEDTIME.   promethazine 25 MG tablet Commonly known as: PHENERGAN Take 1 tablet (25 mg total) by mouth every 6 (six) hours as needed for nausea or vomiting.   tiZANidine 4 MG tablet Commonly known as: Zanaflex Take 1 tablet (4 mg total) by mouth every 6 (six) hours as needed for muscle spasms.   traMADol 50 MG tablet Commonly known as: ULTRAM Take 50 mg by mouth 2 (two) times daily.   traZODone 100 MG tablet Commonly known as: DESYREL TAKE 1 TABLET AT BEDTIME        Discharge Exam: Filed Weights   02/04/22 1116 02/04/22 2116  Weight: 72.5 kg 68.3  kg   General: Alert and oriented x 3, no acute distress Cardiovascular: Regular rate and rhythm, S1-S2 Lungs: Clear to auscultation bilaterally  Condition at discharge: good  The results of significant diagnostics from this hospitalization (including imaging, microbiology, ancillary and laboratory) are listed below for reference.   Imaging Studies: CT Angio Chest PE W/Cm &/Or Wo Cm  Result Date: 02/04/2022 CLINICAL DATA:  Shortness of breath and chest tightness EXAM: CT ANGIOGRAPHY CHEST WITH CONTRAST TECHNIQUE: Multidetector CT imaging of the chest was performed using the standard protocol during bolus administration of intravenous contrast. Multiplanar CT image reconstructions and MIPs were obtained to evaluate the vascular anatomy. RADIATION DOSE REDUCTION: This exam was performed according to the departmental dose-optimization program which includes automated exposure control, adjustment of the mA and/or kV according to patient size and/or use of iterative reconstruction technique. CONTRAST:  30m  OMNIPAQUE IOHEXOL 350 MG/ML SOLN COMPARISON:  Chest CT dated August 18, 2021 FINDINGS: Cardiovascular: No evidence of pulmonary embolus. Heart is upper limits of normal in size. No pericardial effusion. Normal caliber thoracic aorta with aortic valve calcifications and mild atherosclerotic disease. Right chest wall port with tip in the right atrium. Mediastinum/Nodes: Thyroid is unremarkable. Enlarged subcarinal lymph node, measuring up to 1.8 cm, previously measured 1.6 cm. Calcified mediastinal and hilar lymph nodes, likely sequela of prior granulomatous infection. Lungs/Pleura: Central airways are patent. Bilateral bronchial wall thickening and interlobular septal thickening. Trace right-greater-than-left pleural effusions. No consolidation. Upper Abdomen: No acute abnormality. Musculoskeletal: No chest wall abnormality. No acute or significant osseous findings. Review of the MIP images confirms the above findings. IMPRESSION: 1. No evidence of pulmonary embolus. 2. Pulmonary edema and trace right-greater-than-left pleural effusions. 3. Enlarged subcarinal lymph node, possibly reactive, although somewhat greater in size than expected. Recommend follow-up chest CT in 3 months to ensure resolution. 4. Aortic valve calcifications, findings can be seen in the setting of aortic sclerosis or stenosis. Correlate with echocardiography. 5. Aortic Atherosclerosis (ICD10-I70.0) and Emphysema (ICD10-J43.9). Electronically Signed   By: LYetta GlassmanM.D.   On: 02/04/2022 16:05   DG Chest Port 1 View  Result Date: 02/04/2022 CLINICAL DATA:  Chest pain. EXAM: PORTABLE CHEST 1 VIEW COMPARISON:  Chest x-ray August 21, 2021. FINDINGS: Chronically prominent interstitial markings diffusely. No confluent consolidation. Borderline enlargement the cardiac silhouette. Central pulmonary vascular congestion. Right IJ approach central venous catheter with the tip projecting at the superior cavoatrial junction. IMPRESSION: 1.  Chronically prominent interstitial lung markings diffusely, which could represent mild interstitial edema versus the sequela of recurrent bouts of CHF. 2. Borderline cardiomegaly and central pulmonary vascular congestion. Electronically Signed   By: FMargaretha SheffieldM.D.   On: 02/04/2022 11:44    Microbiology: Results for orders placed or performed during the hospital encounter of 08/18/21  Culture, blood (Routine x 2)     Status: None   Collection Time: 08/18/21  5:31 PM   Specimen: BLOOD  Result Value Ref Range Status   Specimen Description BLOOD BLOOD RIGHT FOREARM  Final   Special Requests   Final    BOTTLES DRAWN AEROBIC AND ANAEROBIC Blood Culture adequate volume   Culture   Final    NO GROWTH 5 DAYS [facility] [address], [place] [address]   Report Status 08/23/2021 FINAL  Final  Culture, blood (Routine x 2)     Status: None   Collection Time: 08/18/21  6:41 PM   Specimen: BLOOD  Result Value Ref Range Status  Specimen Description BLOOD RIGHT ANTECUBITAL  Final   Special Requests   Final    BOTTLES DRAWN AEROBIC AND ANAEROBIC Blood Culture adequate volume   Culture   Final    NO GROWTH 5 DAYS Performed at [facility], [address] [place], [place] [address]    Report Status 08/23/2021 FINAL  Final  Resp Panel by RT-PCR (Flu A&B, Covid) Nasopharyngeal Swab     Status: None   Collection Time: 08/18/21  7:36 PM   Specimen: Nasopharyngeal Swab; Nasopharyngeal(NP) swabs in vial transport medium  Result Value Ref Range Status   SARS Coronavirus 2 by RT PCR NEGATIVE NEGATIVE Final    Comment: (NOTE) SARS-CoV-2 target nucleic acids are NOT DETECTED.  The SARS-CoV-2 RNA is generally detectable in upper respiratory specimens during the acute phase of infection. The lowest concentration of SARS-CoV-2 viral copies this assay can detect is 138 copies/mL. A negative result does not preclude SARS-Cov-2 infection and should not be  used as the sole basis for treatment or other patient management decisions. A negative result may occur with  improper specimen collection/handling, submission of specimen other than nasopharyngeal swab, presence of viral mutation(s) within the areas targeted by this assay, and inadequate number of viral copies(<138 copies/mL). A negative result must be combined with clinical observations, patient history, and epidemiological information. The expected result is Negative.  Fact Sheet for Patients:  EntrepreneurPulse.com.au  Fact Sheet for Healthcare Providers:  IncredibleEmployment.be  This test is no t yet approved or cleared by the [place] FDA and  has been authorized for detection and/or diagnosis of SARS-CoV-2 by FDA under an Emergency Use Authorization (EUA). This EUA will remain  in effect (meaning this test can be used) for the duration of the COVID-19 declaration under Section 564(b)(1) of the Act, 21 U.S.C.section 360bbb-3(b)(1), unless the authorization is terminated  or revoked sooner.       Influenza A by PCR NEGATIVE NEGATIVE Final   Influenza B by PCR NEGATIVE NEGATIVE Final    Comment: (NOTE) The Xpert Xpress SARS-CoV-2/FLU/RSV plus assay is intended as an aid in the diagnosis of influenza from Nasopharyngeal swab specimens and should not be used as a sole basis for treatment. Nasal washings and aspirates are unacceptable for Xpert Xpress SARS-CoV-2/FLU/RSV testing.  Fact Sheet for Patients: EntrepreneurPulse.com.au  Fact Sheet for Healthcare Providers: IncredibleEmployment.be  This test is not yet approved or cleared by the [place] FDA and has been authorized for detection and/or diagnosis of SARS-CoV-2 by FDA under an Emergency Use Authorization (EUA). This EUA will remain in effect (meaning this test can be used) for the duration of the COVID-19 declaration under Section  564(b)(1) of the Act, 21 U.S.C. section 360bbb-3(b)(1), unless the authorization is terminated or revoked.  Performed at [facility], [address] [place], [place] [address]   Urine Culture     Status: Abnormal   Collection Time: 08/18/21 10:30 PM   Specimen: Urine, Clean Catch  Result Value Ref Range Status   Specimen Description   Final    URINE, CLEAN CATCH Performed at [facility], [address], [place], [place] [address]    Special Requests   Final    NONE Performed at [facility], [address] [place],  [address]    Culture MULTIPLE SPECIES PRESENT, SUGGEST RECOLLECTION (A)  Final   Report Status 08/20/2021 FINAL  Final  Expectorated Sputum Assessment w Gram Stain, Rflx to Resp Cult     Status: None  Collection Time: 08/19/21  8:03 AM   Specimen: Sputum  Result Value Ref Range Status   Specimen Description SPUTUM  Final   Special Requests NONE  Final   Sputum evaluation   Final    THIS SPECIMEN IS ACCEPTABLE FOR SPUTUM CULTURE Performed at [facility], [address] [place], [place] [address]    Report Status 08/19/2021 FINAL  Final  Culture, Respiratory w Gram Stain     Status: None   Collection Time: 08/19/21  8:03 AM   Specimen: SPU  Result Value Ref Range Status   Specimen Description   Final    SPUTUM Performed at [facility], [address], [place], [place] [address]    Special Requests   Final    NONE Reflexed from [id number] Performed at [facility], [place], [place] [address]    Gram Stain   Final    RARE SQUAMOUS EPITHELIAL CELLS PRESENT MODERATE WBC PRESENT,BOTH PMN AND MONONUCLEAR RARE GRAM POSITIVE COCCI RARE GRAM POSITIVE RODS    Culture   Final    FEW Normal respiratory flora-no Staph aureus or Pseudomonas seen Performed at [facility], [place] [address] [place], [place] [address]    Report Status 08/21/2021 FINAL  Final  Respiratory (~20  pathogens) panel by PCR     Status: Abnormal   Collection Time: 08/20/21  7:29 AM   Specimen: Nasopharyngeal Swab; Respiratory  Result Value Ref Range Status   Adenovirus NOT DETECTED NOT DETECTED Final   Coronavirus 229E NOT DETECTED NOT DETECTED Final    Comment: (NOTE) The Coronavirus on the Respiratory Panel, DOES NOT test for the novel  Coronavirus (2019 nCoV)    Coronavirus HKU1 NOT DETECTED NOT DETECTED Final   Coronavirus NL63 NOT DETECTED NOT DETECTED Final   Coronavirus OC43 NOT DETECTED NOT DETECTED Final   Metapneumovirus DETECTED (A) NOT DETECTED Final   Rhinovirus / Enterovirus NOT DETECTED NOT DETECTED Final   Influenza A NOT DETECTED NOT DETECTED Final   Influenza B NOT DETECTED NOT DETECTED Final   Parainfluenza Virus 1 NOT DETECTED NOT DETECTED Final   Parainfluenza Virus 2 NOT DETECTED NOT DETECTED Final   Parainfluenza Virus 3 NOT DETECTED NOT DETECTED Final   Parainfluenza Virus 4 NOT DETECTED NOT DETECTED Final   Respiratory Syncytial Virus NOT DETECTED NOT DETECTED Final   Bordetella pertussis NOT DETECTED NOT DETECTED Final   Bordetella Parapertussis NOT DETECTED NOT DETECTED Final   Chlamydophila pneumoniae NOT DETECTED NOT DETECTED Final   Mycoplasma pneumoniae NOT DETECTED NOT DETECTED Final    Comment: Performed at [facility], [address] [place], [place] [address]  MRSA Next Gen by PCR, Nasal     Status: None   Collection Time: 08/23/21 12:20 PM   Specimen: Nasal Mucosa; Nasal Swab  Result Value Ref Range Status   MRSA by PCR Next Gen NOT DETECTED NOT DETECTED Final    Comment: (NOTE) The GeneXpert MRSA Assay (FDA approved for NASAL specimens only), is one component of a comprehensive MRSA colonization surveillance program. It is not intended to diagnose MRSA infection nor to guide or monitor treatment for MRSA infections. Test performance is not FDA approved in patients less than 51 years old. Performed at [facility], [address] [place], [place] [address]    *Note: Due to a large number of results and/or encounters for the requested time period, some results have not been displayed. A complete set of results  can be found in Results Review.    Labs: CBC: Recent Labs  Lab 02/04/22 1328 02/05/22 0618  WBC 6.7 8.6  NEUTROABS 4.3  --   HGB 10.1* 10.9*  HCT 31.8* 34.0*  MCV 78.1* 76.4*  PLT 137* 825   Basic Metabolic Panel: Recent Labs  Lab 02/04/22 0755 02/04/22 1007 02/05/22 0618  NA  --  135 139  K 3.9 3.9 3.6  CL  --  102 101  CO2  --  24 25  GLUCOSE  --  154* 157*  BUN  --  24* 26*  CREATININE  --  0.97 1.34*  CALCIUM  --  8.8* 9.2  MG 1.0*  --   --    Liver Function Tests: Recent Labs  Lab 02/04/22 1007  AST 31  ALT 33  ALKPHOS 94  BILITOT 0.4  PROT 6.6  ALBUMIN 3.9   CBG: Recent Labs  Lab 02/05/22 1038 02/05/22 1546  GLUCAP 234* 141*    Discharge time spent: less than 30 minutes.  Signed: Annita Brod, MD [facility] 02/06/2022

## 2022-02-07 LAB — MULTIPLE MYELOMA PANEL, SERUM
Albumin SerPl Elph-Mcnc: 3.6 g/dL (ref 2.9–4.4)
Albumin/Glob SerPl: 1.4 (ref 0.7–1.7)
Alpha 1: 0.3 g/dL (ref 0.0–0.4)
Alpha2 Glob SerPl Elph-Mcnc: 1 g/dL (ref 0.4–1.0)
B-Globulin SerPl Elph-Mcnc: 0.9 g/dL (ref 0.7–1.3)
Gamma Glob SerPl Elph-Mcnc: 0.4 g/dL (ref 0.4–1.8)
Globulin, Total: 2.6 g/dL (ref 2.2–3.9)
IgA: 19 mg/dL — ABNORMAL LOW (ref 87–352)
IgG (Immunoglobin G), Serum: 415 mg/dL — ABNORMAL LOW (ref 586–1602)
IgM (Immunoglobulin M), Srm: 15 mg/dL — ABNORMAL LOW (ref 26–217)
Total Protein ELP: 6.2 g/dL (ref 6.0–8.5)

## 2022-02-11 ENCOUNTER — Telehealth: Payer: Self-pay

## 2022-02-11 ENCOUNTER — Inpatient Hospital Stay: Payer: Medicare HMO

## 2022-02-11 DIAGNOSIS — Z79899 Other long term (current) drug therapy: Secondary | ICD-10-CM | POA: Diagnosis not present

## 2022-02-11 DIAGNOSIS — D801 Nonfamilial hypogammaglobulinemia: Secondary | ICD-10-CM | POA: Diagnosis not present

## 2022-02-11 DIAGNOSIS — R0602 Shortness of breath: Secondary | ICD-10-CM | POA: Diagnosis not present

## 2022-02-11 DIAGNOSIS — B952 Enterococcus as the cause of diseases classified elsewhere: Secondary | ICD-10-CM | POA: Diagnosis not present

## 2022-02-11 DIAGNOSIS — G620 Drug-induced polyneuropathy: Secondary | ICD-10-CM | POA: Diagnosis not present

## 2022-02-11 DIAGNOSIS — C9000 Multiple myeloma not having achieved remission: Secondary | ICD-10-CM | POA: Diagnosis not present

## 2022-02-11 DIAGNOSIS — T451X5A Adverse effect of antineoplastic and immunosuppressive drugs, initial encounter: Secondary | ICD-10-CM | POA: Diagnosis not present

## 2022-02-11 DIAGNOSIS — E538 Deficiency of other specified B group vitamins: Secondary | ICD-10-CM | POA: Diagnosis not present

## 2022-02-11 DIAGNOSIS — D709 Neutropenia, unspecified: Secondary | ICD-10-CM | POA: Diagnosis not present

## 2022-02-11 LAB — POTASSIUM: Potassium: 3.6 mmol/L (ref 3.5–5.1)

## 2022-02-11 LAB — MAGNESIUM: Magnesium: 1.3 mg/dL — ABNORMAL LOW (ref 1.7–2.4)

## 2022-02-11 MED ORDER — MAGNESIUM SULFATE 4 GM/100ML IV SOLN
4.0000 g | Freq: Once | INTRAVENOUS | Status: AC
  Administered 2022-02-11: 4 g via INTRAVENOUS

## 2022-02-11 MED ORDER — MAGNESIUM SULFATE 2 GM/50ML IV SOLN
2.0000 g | Freq: Once | INTRAVENOUS | Status: AC
  Administered 2022-02-11: 2 g via INTRAVENOUS

## 2022-02-11 MED ORDER — SODIUM CHLORIDE 0.9 % IV SOLN
25.0000 mg | Freq: Once | INTRAVENOUS | Status: AC
  Administered 2022-02-11: 25 mg via INTRAVENOUS
  Filled 2022-02-11: qty 1

## 2022-02-11 MED ORDER — SODIUM CHLORIDE 0.9 % IV SOLN
INTRAVENOUS | Status: DC
  Filled 2022-02-11: qty 250

## 2022-02-11 MED ORDER — HEPARIN SOD (PORK) LOCK FLUSH 100 UNIT/ML IV SOLN
500.0000 [IU] | Freq: Once | INTRAVENOUS | Status: AC | PRN
  Administered 2022-02-11: 500 [IU]
  Filled 2022-02-11: qty 5

## 2022-02-11 MED ORDER — SODIUM CHLORIDE 0.9% FLUSH
10.0000 mL | Freq: Once | INTRAVENOUS | Status: AC | PRN
  Administered 2022-02-11: 10 mL
  Filled 2022-02-11: qty 10

## 2022-02-11 MED ORDER — SODIUM CHLORIDE 0.9 % IV SOLN: 6.0000 g | Freq: Once | INTRAVENOUS | Status: DC

## 2022-02-11 NOTE — Telephone Encounter (Signed)
Please schedule patient for 1 dose of IVIG, Thanks

## 2022-02-11 NOTE — Telephone Encounter (Signed)
Pt is active on mychart please sent mychart message with appt details.

## 2022-02-11 NOTE — Progress Notes (Signed)
Pt recovering from CHF exacerbation. No dyspnea. No edema noted. No CP. Receiving 6 grams magnesium over 3 hours. VSS. Discharged to home at completion.

## 2022-02-11 NOTE — Telephone Encounter (Signed)
-----   Message from Earlie Server, MD sent at 02/10/2022  3:02 PM EDT ----- Please arrange patient to have 1 dose of IVIG.

## 2022-02-12 DIAGNOSIS — R682 Dry mouth, unspecified: Secondary | ICD-10-CM | POA: Diagnosis not present

## 2022-02-12 DIAGNOSIS — N189 Chronic kidney disease, unspecified: Secondary | ICD-10-CM | POA: Diagnosis not present

## 2022-02-12 DIAGNOSIS — I509 Heart failure, unspecified: Secondary | ICD-10-CM | POA: Diagnosis not present

## 2022-02-12 DIAGNOSIS — E1122 Type 2 diabetes mellitus with diabetic chronic kidney disease: Secondary | ICD-10-CM | POA: Diagnosis not present

## 2022-02-12 DIAGNOSIS — B370 Candidal stomatitis: Secondary | ICD-10-CM | POA: Diagnosis not present

## 2022-02-12 DIAGNOSIS — Z79899 Other long term (current) drug therapy: Secondary | ICD-10-CM | POA: Diagnosis not present

## 2022-02-12 DIAGNOSIS — Z9484 Stem cells transplant status: Secondary | ICD-10-CM | POA: Diagnosis not present

## 2022-02-12 DIAGNOSIS — M879 Osteonecrosis, unspecified: Secondary | ICD-10-CM | POA: Diagnosis not present

## 2022-02-12 DIAGNOSIS — F32A Depression, unspecified: Secondary | ICD-10-CM | POA: Diagnosis not present

## 2022-02-13 ENCOUNTER — Ambulatory Visit: Payer: Self-pay

## 2022-02-13 ENCOUNTER — Other Ambulatory Visit: Payer: Self-pay | Admitting: Oncology

## 2022-02-13 ENCOUNTER — Inpatient Hospital Stay: Payer: Medicare HMO

## 2022-02-13 VITALS — BP 135/59 | HR 78 | Temp 97.0°F | Resp 18 | Wt 159.4 lb

## 2022-02-13 DIAGNOSIS — B952 Enterococcus as the cause of diseases classified elsewhere: Secondary | ICD-10-CM | POA: Diagnosis not present

## 2022-02-13 DIAGNOSIS — Z79899 Other long term (current) drug therapy: Secondary | ICD-10-CM | POA: Diagnosis not present

## 2022-02-13 DIAGNOSIS — G620 Drug-induced polyneuropathy: Secondary | ICD-10-CM | POA: Diagnosis not present

## 2022-02-13 DIAGNOSIS — E538 Deficiency of other specified B group vitamins: Secondary | ICD-10-CM | POA: Diagnosis not present

## 2022-02-13 DIAGNOSIS — T451X5A Adverse effect of antineoplastic and immunosuppressive drugs, initial encounter: Secondary | ICD-10-CM | POA: Diagnosis not present

## 2022-02-13 DIAGNOSIS — Z95828 Presence of other vascular implants and grafts: Secondary | ICD-10-CM

## 2022-02-13 DIAGNOSIS — D801 Nonfamilial hypogammaglobulinemia: Secondary | ICD-10-CM | POA: Diagnosis not present

## 2022-02-13 DIAGNOSIS — C9000 Multiple myeloma not having achieved remission: Secondary | ICD-10-CM | POA: Diagnosis not present

## 2022-02-13 DIAGNOSIS — D709 Neutropenia, unspecified: Secondary | ICD-10-CM | POA: Diagnosis not present

## 2022-02-13 DIAGNOSIS — R0602 Shortness of breath: Secondary | ICD-10-CM | POA: Diagnosis not present

## 2022-02-13 MED ORDER — DIPHENHYDRAMINE HCL 25 MG PO CAPS
25.0000 mg | ORAL_CAPSULE | Freq: Four times a day (QID) | ORAL | Status: DC | PRN
  Administered 2022-02-13: 25 mg via ORAL

## 2022-02-13 MED ORDER — DEXTROSE 5 % IV SOLN
INTRAVENOUS | Status: DC
  Filled 2022-02-13: qty 250

## 2022-02-13 MED ORDER — IMMUNE GLOBULIN (HUMAN) 5 GM/50ML IV SOLN
25.0000 g | Freq: Once | INTRAVENOUS | Status: AC
  Administered 2022-02-13: 25 g via INTRAVENOUS
  Filled 2022-02-13: qty 200

## 2022-02-13 MED ORDER — ACETAMINOPHEN 325 MG PO TABS
650.0000 mg | ORAL_TABLET | Freq: Four times a day (QID) | ORAL | Status: DC | PRN
  Administered 2022-02-13: 650 mg via ORAL

## 2022-02-13 MED ORDER — SODIUM CHLORIDE 0.9% FLUSH
10.0000 mL | Freq: Once | INTRAVENOUS | Status: AC
  Administered 2022-02-13: 10 mL via INTRAVENOUS
  Filled 2022-02-13: qty 10

## 2022-02-13 MED ORDER — HEPARIN SOD (PORK) LOCK FLUSH 100 UNIT/ML IV SOLN
500.0000 [IU] | Freq: Once | INTRAVENOUS | Status: AC
  Administered 2022-02-13: 500 [IU] via INTRAVENOUS
  Filled 2022-02-13: qty 5

## 2022-02-13 NOTE — Patient Instructions (Signed)
[facility]  Discharge Instructions: Thank you for choosing [facility] to provide your oncology and hematology care.  If you have a lab appointment with the [facility], please go directly to the [facility] and check in at the registration area.  Wear comfortable clothing and clothing appropriate for easy access to any Portacath or PICC line.   We strive to give you quality time with your provider. You may need to reschedule your appointment if you arrive late (15 or more minutes).  Arriving late affects you and other patients whose appointments are after yours.  Also, if you miss three or more appointments without notifying the office, you may be dismissed from the clinic at the provider's discretion.      For prescription refill requests, have your pharmacy contact our office and allow 72 hours for refills to be completed.    Today you received the following chemotherapy and/or immunotherapy agents IVIG      To help prevent nausea and vomiting after your treatment, we encourage you to take your nausea medication as directed.  BELOW ARE SYMPTOMS THAT SHOULD BE REPORTED IMMEDIATELY: *FEVER GREATER THAN 100.4 F (38 C) OR HIGHER *CHILLS OR SWEATING *NAUSEA AND VOMITING THAT IS NOT CONTROLLED WITH YOUR NAUSEA MEDICATION *UNUSUAL SHORTNESS OF BREATH *UNUSUAL BRUISING OR BLEEDING *URINARY PROBLEMS (pain or burning when urinating, or frequent urination) *BOWEL PROBLEMS (unusual diarrhea, constipation, pain near the anus) TENDERNESS IN MOUTH AND THROAT WITH OR WITHOUT PRESENCE OF ULCERS (sore throat, sores in mouth, or a toothache) UNUSUAL RASH, SWELLING OR PAIN  UNUSUAL VAGINAL DISCHARGE OR ITCHING   Items with * indicate a potential emergency and should be followed up as soon as possible or go to the Emergency Department if any problems should occur.  Please show the CHEMOTHERAPY ALERT CARD or IMMUNOTHERAPY ALERT CARD at check-in to the  Emergency Department and triage nurse.  Should you have questions after your visit or need to cancel or reschedule your appointment, please contact [facility] [place] [facility] [place]  [phone] and follow the prompts.  Office hours are 8:00 a.m. to 4:30 p.m. Monday - Friday. Please note that voicemails left after 4:00 p.m. may not be returned until the following business day.  We are closed weekends and major holidays. You have access to a nurse at all times for urgent questions. Please call the main number to the clinic [phone] and follow the prompts.  For any non-urgent questions, you may also contact your provider using MyChart. We now offer e-Visits for anyone 58 and older to request care online for non-urgent symptoms. For details visit [email or web address].   Also download the MyChart app! Go to the app store, search "MyChart", open the app, select [facility], and log in with your MyChart username and password.  Masks are optional in the cancer centers. If you would like for your care team to wear a mask while they are taking care of you, please let them know. For doctor visits, patients may have with them one support person who is at least 65 years old. At this time, visitors are not allowed in the infusion area.   Immune Globulin Injection What is this medication? IMMUNE GLOBULIN (im MUNE  GLOB yoo lin) helps to prevent or reduce the severity of certain infections in patients who are at risk. This medicine is collected from the pooled blood of many donors. It is used to treat immune system problems, thrombocytopenia, and Kawasaki syndrome. This  medicine may be used for other purposes; ask your health care provider or pharmacist if you have questions. This medicine may be used for other purposes; ask your health care provider or pharmacist if you have questions. COMMON BRAND NAME(S): ASCENIV, Baygam, BIVIGAM, Carimune, Carimune NF, cutaquig, Cuvitru, Flebogamma,  Flebogamma DIF, GamaSTAN, GamaSTAN S/D, Gamimune N, Gammagard, Gammagard S/D, Gammaked, Gammaplex, Gammar-P IV, Gamunex, Gamunex-C, Hizentra, Iveegam, Iveegam EN, Octagam, Panglobulin, Panglobulin NF, panzyga, Polygam S/D, Privigen, Sandoglobulin, Venoglobulin-S, Vigam, Vivaglobulin, Xembify What should I tell my care team before I take this medication? They need to know if you have any of these conditions: diabetes extremely low or no immune antibodies in the blood heart disease history of blood clots hyperprolinemia infection in the blood, sepsis kidney disease recently received or scheduled to receive a vaccination an unusual or allergic reaction to human immune globulin, albumin, maltose, sucrose, other medicines, foods, dyes, or preservatives pregnant or trying to get pregnant breast-feeding How should I use this medication? This medicine is for injection into a muscle or infusion into a vein or skin. It is usually given by a health care professional in a hospital or clinic setting. In rare cases, some brands of this medicine might be given at home. You will be taught how to give this medicine. Use exactly as directed. Take your medicine at regular intervals. Do not take your medicine more often than directed. Talk to your pediatrician regarding the use of this medicine in children. While this drug may be prescribed for selected conditions, precautions do apply. Overdosage: If you think you have taken too much of this medicine contact a poison control center or emergency room at once. NOTE: This medicine is only for you. Do not share this medicine with others. Overdosage: If you think you have taken too much of this medicine contact a poison control center or emergency room at once. NOTE: This medicine is only for you. Do not share this medicine with others. What if I miss a dose? It is important not to miss your dose. Call your doctor or health care professional if you are unable to keep  an appointment. If you give yourself the medicine and you miss a dose, take it as soon as you can. If it is almost time for your next dose, take only that dose. Do not take double or extra doses. What may interact with this medication? aspirin and aspirin-like medicines cisplatin cyclosporine medicines for infection like acyclovir, adefovir, amphotericin B, bacitracin, cidofovir, foscarnet, ganciclovir, gentamicin, pentamidine, vancomycin NSAIDS, medicines for pain and inflammation, like ibuprofen or naproxen pamidronate vaccines zoledronic acid This list may not describe all possible interactions. Give your health care provider a list of all the medicines, herbs, non-prescription drugs, or dietary supplements you use. Also tell them if you smoke, drink alcohol, or use illegal drugs. Some items may interact with your medicine. This list may not describe all possible interactions. Give your health care provider a list of all the medicines, herbs, non-prescription drugs, or dietary supplements you use. Also tell them if you smoke, drink alcohol, or use illegal drugs. Some items may interact with your medicine. What should I watch for while using this medication? Your condition will be monitored carefully while you are receiving this medicine. This medicine is made from pooled blood donations of many different people. It may be possible to pass an infection in this medicine. However, the donors are screened for infections and all products are tested for HIV and hepatitis. The medicine is  treated to kill most or all bacteria and viruses. Talk to your doctor about the risks and benefits of this medicine. Do not have vaccinations for at least 14 days before, or until at least 3 months after receiving this medicine. What side effects may I notice from receiving this medication? Side effects that you should report to your doctor or health care professional as soon as possible: allergic reactions like skin  rash, itching or hives, swelling of the face, lips, or tongue blue colored lips or skin breathing problems chest pain or tightness fever signs and symptoms of aseptic meningitis such as stiff neck; sensitivity to light; headache; drowsiness; fever; nausea; vomiting; rash signs and symptoms of a blood clot such as chest pain; shortness of breath; pain, swelling, or warmth in the leg signs and symptoms of hemolytic anemia such as fast heartbeat; tiredness; dark yellow or brown urine; or yellowing of the eyes or skin signs and symptoms of kidney injury like trouble passing urine or change in the amount of urine sudden weight gain swelling of the ankles, feet, hands Side effects that usually do not require medical attention (report to your doctor or health care professional if they continue or are bothersome): diarrhea flushing headache increased sweating joint pain muscle cramps muscle pain nausea pain, redness, or irritation at site where injected tiredness This list may not describe all possible side effects. Call your doctor for medical advice about side effects. You may report side effects to FDA at 1-800-FDA-1088. This list may not describe all possible side effects. Call your doctor for medical advice about side effects. You may report side effects to FDA at 1-800-FDA-1088. Where should I keep my medication? Keep out of the reach of children. This drug is usually given in a hospital or clinic and will not be stored at home. In rare cases, some brands of this medicine may be given at home. If you are using this medicine at home, you will be instructed on how to store this medicine. Throw away any unused medicine after the expiration date on the label. NOTE: This sheet is a summary. It may not cover all possible information. If you have questions about this medicine, talk to your doctor, pharmacist, or health care provider.  2023 Elsevier/Gold Standard (2019-01-20 00:00:00)

## 2022-02-13 NOTE — Telephone Encounter (Signed)
Summary: medication clarification   [name] w/ [facility] states patient is currently prescribed FLUoxetine (PROZAC) 40 MG capsule and DULoxetine (CYMBALTA) 60 MG capsule   Caller needing clarification on which therapy patient is currently taking   Please advise     Spoke with Valla Leaver Pharmacist at [facility] - advised Cymbalta was added 12/07/20.  Reason for Disposition . Pharmacy calling with prescription question and triager answers question  Protocols used: Medication Question Call-A-AH

## 2022-02-18 ENCOUNTER — Other Ambulatory Visit: Payer: Self-pay | Admitting: *Deleted

## 2022-02-18 ENCOUNTER — Inpatient Hospital Stay: Payer: Medicare HMO

## 2022-02-18 ENCOUNTER — Inpatient Hospital Stay: Payer: Medicare HMO | Admitting: Nurse Practitioner

## 2022-02-18 ENCOUNTER — Other Ambulatory Visit: Payer: Self-pay | Admitting: Internal Medicine

## 2022-02-18 ENCOUNTER — Encounter: Payer: Self-pay | Admitting: Medical Oncology

## 2022-02-18 ENCOUNTER — Inpatient Hospital Stay (HOSPITAL_BASED_OUTPATIENT_CLINIC_OR_DEPARTMENT_OTHER): Payer: Medicare HMO | Admitting: Medical Oncology

## 2022-02-18 VITALS — BP 142/84 | HR 62 | Temp 97.5°F | Resp 16 | Ht 62.0 in | Wt 154.0 lb

## 2022-02-18 DIAGNOSIS — G620 Drug-induced polyneuropathy: Secondary | ICD-10-CM | POA: Diagnosis not present

## 2022-02-18 DIAGNOSIS — D801 Nonfamilial hypogammaglobulinemia: Secondary | ICD-10-CM | POA: Diagnosis not present

## 2022-02-18 DIAGNOSIS — B952 Enterococcus as the cause of diseases classified elsewhere: Secondary | ICD-10-CM | POA: Diagnosis not present

## 2022-02-18 DIAGNOSIS — F5101 Primary insomnia: Secondary | ICD-10-CM

## 2022-02-18 DIAGNOSIS — Z79899 Other long term (current) drug therapy: Secondary | ICD-10-CM | POA: Diagnosis not present

## 2022-02-18 DIAGNOSIS — E538 Deficiency of other specified B group vitamins: Secondary | ICD-10-CM | POA: Diagnosis not present

## 2022-02-18 DIAGNOSIS — R197 Diarrhea, unspecified: Secondary | ICD-10-CM | POA: Diagnosis not present

## 2022-02-18 DIAGNOSIS — D709 Neutropenia, unspecified: Secondary | ICD-10-CM | POA: Diagnosis not present

## 2022-02-18 DIAGNOSIS — C9000 Multiple myeloma not having achieved remission: Secondary | ICD-10-CM | POA: Diagnosis not present

## 2022-02-18 DIAGNOSIS — T451X5A Adverse effect of antineoplastic and immunosuppressive drugs, initial encounter: Secondary | ICD-10-CM | POA: Diagnosis not present

## 2022-02-18 DIAGNOSIS — Z9484 Stem cells transplant status: Secondary | ICD-10-CM | POA: Diagnosis not present

## 2022-02-18 DIAGNOSIS — R0602 Shortness of breath: Secondary | ICD-10-CM | POA: Diagnosis not present

## 2022-02-18 DIAGNOSIS — R110 Nausea: Secondary | ICD-10-CM | POA: Diagnosis not present

## 2022-02-18 LAB — CBC WITH DIFFERENTIAL/PLATELET
Abs Immature Granulocytes: 0.03 10*3/uL (ref 0.00–0.07)
Basophils Absolute: 0 10*3/uL (ref 0.0–0.1)
Basophils Relative: 0 %
Eosinophils Absolute: 0.2 10*3/uL (ref 0.0–0.5)
Eosinophils Relative: 2 %
HCT: 35.3 % — ABNORMAL LOW (ref 36.0–46.0)
Hemoglobin: 11.1 g/dL — ABNORMAL LOW (ref 12.0–15.0)
Immature Granulocytes: 0 %
Lymphocytes Relative: 15 %
Lymphs Abs: 1.3 10*3/uL (ref 0.7–4.0)
MCH: 24.5 pg — ABNORMAL LOW (ref 26.0–34.0)
MCHC: 31.4 g/dL (ref 30.0–36.0)
MCV: 77.9 fL — ABNORMAL LOW (ref 80.0–100.0)
Monocytes Absolute: 0.5 10*3/uL (ref 0.1–1.0)
Monocytes Relative: 6 %
Neutro Abs: 6.3 10*3/uL (ref 1.7–7.7)
Neutrophils Relative %: 77 %
Platelets: 151 10*3/uL (ref 150–400)
RBC: 4.53 MIL/uL (ref 3.87–5.11)
RDW: 15.2 % (ref 11.5–15.5)
WBC: 8.2 10*3/uL (ref 4.0–10.5)
nRBC: 0 % (ref 0.0–0.2)

## 2022-02-18 LAB — MAGNESIUM: Magnesium: 1 mg/dL — ABNORMAL LOW (ref 1.7–2.4)

## 2022-02-18 LAB — COMPREHENSIVE METABOLIC PANEL
ALT: 41 U/L (ref 0–44)
AST: 40 U/L (ref 15–41)
Albumin: 4.2 g/dL (ref 3.5–5.0)
Alkaline Phosphatase: 102 U/L (ref 38–126)
Anion gap: 11 (ref 5–15)
BUN: 23 mg/dL (ref 8–23)
CO2: 24 mmol/L (ref 22–32)
Calcium: 9.1 mg/dL (ref 8.9–10.3)
Chloride: 101 mmol/L (ref 98–111)
Creatinine, Ser: 1.35 mg/dL — ABNORMAL HIGH (ref 0.44–1.00)
GFR, Estimated: 44 mL/min — ABNORMAL LOW (ref >60)
Glucose, Bld: 186 mg/dL — ABNORMAL HIGH (ref 70–99)
Potassium: 3.6 mmol/L (ref 3.5–5.1)
Sodium: 136 mmol/L (ref 135–145)
Total Bilirubin: 0.6 mg/dL (ref 0.3–1.2)
Total Protein: 7.6 g/dL (ref 6.5–8.1)

## 2022-02-18 MED ORDER — MAGNESIUM SULFATE 2 GM/50ML IV SOLN
2.0000 g | Freq: Once | INTRAVENOUS | Status: AC
  Administered 2022-02-18: 2 g via INTRAVENOUS

## 2022-02-18 MED ORDER — SODIUM CHLORIDE 0.9 % IV SOLN
25.0000 mg | Freq: Once | INTRAVENOUS | Status: AC
  Administered 2022-02-18: 25 mg via INTRAVENOUS
  Filled 2022-02-18: qty 1

## 2022-02-18 MED ORDER — SODIUM CHLORIDE 0.9% FLUSH
10.0000 mL | Freq: Once | INTRAVENOUS | Status: AC | PRN
  Administered 2022-02-18: 10 mL
  Filled 2022-02-18: qty 10

## 2022-02-18 MED ORDER — SODIUM CHLORIDE 0.9 % IV SOLN: 6.0000 g | Freq: Once | INTRAVENOUS | Status: DC

## 2022-02-18 MED ORDER — HEPARIN SOD (PORK) LOCK FLUSH 100 UNIT/ML IV SOLN
500.0000 [IU] | Freq: Once | INTRAVENOUS | Status: AC | PRN
  Administered 2022-02-18: 500 [IU]
  Filled 2022-02-18: qty 5

## 2022-02-18 MED ORDER — SODIUM CHLORIDE 0.9 % IV SOLN
Freq: Once | INTRAVENOUS | Status: AC
  Filled 2022-02-18: qty 250

## 2022-02-18 MED ORDER — MAGNESIUM SULFATE 4 GM/100ML IV SOLN
4.0000 g | Freq: Once | INTRAVENOUS | Status: AC
  Administered 2022-02-18: 4 g via INTRAVENOUS

## 2022-02-18 MED ORDER — PROMETHAZINE HCL 25 MG PO TABS: 25.0000 mg | ORAL_TABLET | Freq: Four times a day (QID) | ORAL | 1 refills | Status: DC | PRN

## 2022-02-18 NOTE — Progress Notes (Signed)
Hematology/Oncology Progress note [phone] [phone]        Clinic Day:  02/18/2022   Referring physician: Glean Hess, MD  Chief Complaint: Sarah [name] is a 66 y.o. 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   Initial bone marrow revealed 80% plasma cells.  Lambda free light chains were 1340.  She had nephrotic range proteinuria.  She initially underwent induction with RVD.  Revlimid maintenance was discontinued on 01/21/2017 secondary to intolerance.     07/19/2019 Bone marrow aspirate and biopsy on  revealed a normocellular marrow with but increased lambda-restricted plasma cells (9% aspirate, 40% CD138 immunohistochemistry).  Findings were consistent with recurrent plasma cell myeloma.  Flow cytometry revealed no monoclonal B-cell or phenotypically aberrant T-cell population. Cytogenetics were 55, XX (normal).  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    Lambda light chains have been followed: 22.2 (ratio 0.56) on 07/03/2017, 30.8 (ratio 0.78) on 09/02/2017, 36.9 (ratio 0.40) on 10/21/2017, 37.4 (ratio 0.41) on 12/16/2017, 70.7(ratio 0.31)  on 02/17/2018, 64.2 (ratio 0.27) on 04/07/2018, 78.9 (ratio 0.18) on 05/26/2018, 128.8 (ratio 0.17) on 08/06/2018, 181.5 (ratio 0.13) on 10/08/2018, 130.9 (ratio 0.13) on 10/20/2018, 160.7 (ratio  0.10) on 12/09/2018, 236.6 (ratio 0.07) on 02/01/2019, 363.6 (ratio 0.04) on 03/22/2019, 404.8 (ratio 0.04) on 04/05/2019, 420.7 (ratio 0.03) on 05/24/2019, 573.4 (ratio 0.03) on 06/23/2019, 451.05 (ratio 0.02) on 08/20/2019, 47.2 (ratio 0.13) on 10/25/2019, 22.4 (ratio 0.21) on 11/25/2019, 16.5 (ratio 0.33) on 01/10/2020, 14.6 (ratio 0.34) on 02/07/2020, 13.1 (ratio 0.31) on 03/07/2020, 10.1 (ratio 0.38) on 04/10/2020, and 9.5 (ratio 0.21) on 06/19/2020.   24 hour UPEP on 06/03/2019 revealed kappa free light chains 95.76, lambda free light chains 1,260.71, and ratio 0.08.  24 hour UPEP on 08/23/2019 revealed total protein of 782 mg/24 hrs with lambda free light chains 1,084.16 mg/L and ratio of 0.10 (1.03-31.76).  M spike in urine was 46.1% (361 mg/24 hrs).    Bone survey on 04/08/2016 and 05/28/2017 revealed no definite lytic lesion seen in the visualized skeleton.  Bone survey on 11/19/2018 revealed no suspicious lucent lesions and no acute bony abnormality.  PET scan on 07/12/2019 revealed no focal metabolic activity to suggest active myeloma within the skeleton. There were no lytic lesions identified on the CT portion of the exam or soft tissue plasmacytomas. There was no evidence of multiple myeloma.    Pretreatment RBC phenotype on 09/23/2019 was positive for C, e, DUFFY B, KIDD B, M, S, and s antigen; negative for c, E, KELL, DUFFY A, KIDD A, and N antigen.    09/27/2019 - 10/25/2019; 12/09/2019 - 03/13/2020 6 cycles of daratumumab and hyaluronidase-fihj, Pomalyst, and Decadron (DPd) .  Cycle #1 was complicated by fever and neutropenia requiring admission.  Cycle #2 was complicated by pneumonia requiring admission.  Cycle #6 was complicated with an ER evaluation for an elevated lactic acid.   05/10/2020 second autologous stem cell transplant at  [facility]   She underwent conditioning with melphalan 140 mg/m2.    She is s/p week #3 Velcade maintenance (began 08/31/2020 - 09/28/2020).  She has no increase  in neuropathy.  She has severe aortic stenosis.  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 Cardiology is following for a possible TAVR in the future.  Echo on 07/05/2020 revealed moderate to severe aortic stenosis with an EF of 50-55%.  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 Zometa was discontinued in 01/2017.  She has chronic nausea on Phenergan.     B12 deficiency.  B12 was 254 on 04/09/2017, 295 on 08/20/2018, and 391 on 10/08/2018.  She was on oral B12.  She received B12 monthly (last 09/14/2020).  Folate was 12.1 on 01/10/2020.    She has iron deficiency.  Ferritin was 32 on 07/01/2019.  She received Venofer on 07/15/2019 and 07/22/2019.   She has hypogammaglobulinemia.  IgG was 245 on 11/25/2019.  She received monthly IVIG (07/22/021 - 03/22/2020).  She received IVIG 400 mg/kg on 01/20/2020, 200 mg/kg on 02/17/2020, and 300 mg/kg on 03/22/2020.  IVIG on 84/69/6295 was complicated by acute renal failure. IgG trough level was 418 on 02/17/2020.  10/15/2019 - 10/20/2019 She was admitted to [facility] with fever and neutropenia.  Cultures were negative.  CXR was negative. She received broad spectrum antibiotics and daily Granix.  She received IVF for acute renal insufficiency due to diarrhea and dehydration.  Creatine was 1.66 on admission and 1.12 on discharge.    03/01/2020 - 03/05/2020 admitted to [facility] from with fever and neutropenia. CXR revealed no active cardiopulmonary disease. Chest CT with contrast revealed no acute intrathoracic pathology. There were findings which could be suggestive of prior granulomatous disease. She was treated with Cefepime and Vancomycin, then switched to ciprofloxacin on 03/03/2020.   05/08/2020 - 12/05/2021The patient was admitted to [facility] from  for autologous bone marrow transplant.   She received conditioning with melphalan 140 mg/m2.  Bone marrow transplant was on  05/10/2020. The patient developed fevers daily from 05/19/2020 - 05/24/2020. Blood cultures were + for strept sanguis bacteremia.  She was treated cefepime, vancomycin, and solumedrol for possible engraftment syndrome. Cefepime was switched to ceftriaxone; she completed 2 weeks of ceftriaxone on 06/03/2020. She had chemotherapy induced diarrhea.  She experienced urinary retention.  Feb 2022 patient has been on maintenance Velcade 2 mg every 2 weeks.  Chronic diarrhea seen by gastroenterology Dr. Vicente Males and was recommended to avoid artificial sugars in her diet including Diet Coke and coffee mate.  She feels some improvement of her diarrhea frequency since the dietary modification.  GI profile PCR negative, C. difficile toxin A+ B negative, fecal calprotectin 77 12/19/2020 colonoscopy showed 2 subcentimeter polyps in the ascending colon, resected and removed.  Pathology showed tubular adenoma.  No malignancy.  Normal mucosa in the entire examined colon.  Biopsied, pathology showed colonic mucosa with no significant pathological alteration.  Negative for active inflammation and features of chronicity.  Negative for microscopic colitis, dysplasia, and malignanc  Diabetes, patient has discontinued metformin due to diarrhea and started on glipizide 2.5 mg   03/05/2021-03/09/2021 Patient was hospitalized due to fever and chills, nonproductive cough, shortness of breath. X-ray showed right lower lobe atelectasis versus pneumonia.  Blood cultures negative.  Urine culture showed 60,000 colonies of Enterococcus facialis.  Patient received antibiotics. Patient also had abdominal pelvis CT scan for work-up of intermittent  lower abdomen pain.No acute abnormality.   05/14/21 last dose of Velcade maintenance.  establish care with neurology Dr. Gurney Maxin and her neuropathy regimen has been switched from gabapentin to Lyrica. 05/29/2021 patient got influenza   neuropathy medication has been switched from  gabapentin/Lyrica to nortriptyline.  08/18/2021 Hospitalized due to pneumonia from metapnuemovirus, hospitalization was complicated with NSTEMI. She received heparin gtt.  Treated with antibiotics for coverage of pneumonia,  diuretics for pulmonary edema. She received IVIG $RemoveBefor'400mg'isDskepwaopZ$ /kg x 1 for hypoglobulinemia. Discharged on 08/25/21 with Augmentin and Zithromax.   May 2023, Daratumumab being held for [facility] infectious disease physician Dr. Fatima Sanger due to frequent infection.  INTERVAL HISTORY Maaliyah Adolph is a 66 y.o. female who has above history reviewed by me today presents for follow up visit for management of multiple myeloma.   Previously was getting IVF and IV mag weekly. At her last infusion on 02/04/2022 she experienced SOB, chest pain during IVF infusion. She was taken to the ER and was diagnosed with acute on chronic CHF. She received multiple doses of IV lasix which resolved her symptoms. She was continued on IV lasix 20 mg PO daily at home PRN for weight changes > 2 pounds. Today patient reports that she is feeling much better- no SOB, chest pain. No peripheral edema or excessive fatigue. She has follow up with cardiology on Wednesday of this week. She reports that since her hospitalization her diarrhea has improved.  Wt Readings from Last 3 Encounters:  02/18/22 154 lb (69.9 kg)  02/13/22 159 lb 6.3 oz (72.3 kg)  02/04/22 150 lb 9.6 oz (68.3 kg)     Past Medical History:  Diagnosis Date   Anemia    Anxiety    Aortic stenosis    a. 05/2020 Echo: EF 45-50%, sev AS - seen by TAVR team @ [facility] - CTA sugg of tricuspid valve w/ fusing of 2 leaflets-TAVR deferred in setting of acute infxn; b. 07/2020 Echo: EF 50-55%, mod-sev AS; c. 12/2020 Echo: EF>55%. Mod-sev paradoxical low-flow low-gradient AS; d. 08/2021 Echo: EF 35-40%, severe AS, triv AI, mild MR.   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 Variable EF over time; b. 08/2017 Echo: EF 40%; b. 03/2020 Echo: EF 55-60%; c. 05/2020 Echo: EF 45-50%; d. 07/2020 Echo: EF 50-55%; e. 12/2020 Echo: EF>55%; f. 08/2021 Echo: EF 35-40%.   Chronic heart failure with preserved ejection fraction (HFpEF) ([place])    a. Variable EF over time; b. 08/2017 Echo: EF 40%; b. 03/2020 Echo: EF 55-60%; c. 05/2020 Echo: EF 45-50%; d. 07/2020 Echo: EF 50-55%; e. 12/2020 Echo: EF>55%; f. 08/2021 Echo: EF 35-40%, no RV, mildly dil LA, mild MR, triv AI, severe AS.   CKD (chronic kidney disease) stage 3, GFR 30-59 ml/min (HCC)    Depression    Diabetes mellitus (HCC)    Dizziness    Fatty liver    Frequent falls    GERD (gastroesophageal reflux disease)    Gout    Heart murmur    History of blood transfusion    History of bone marrow transplant ([place])    History of uterine fibroid    Hx of cardiac catheterization    a. 01/2016 Cath [facility] - after abnl nuc): Nl cors.   Hypertension    Hypomagnesemia    IDA (iron deficiency anemia)    Multiple myeloma (HCC)  Personal history of chemotherapy    PSVT (paroxysmal supraventricular tachycardia) ([place])    a. S/p [facility]).   PVC's (premature ventricular contractions)    a. Well-managed w/ bisoprolol in outpt setting.   Renal cyst     Past Surgical History:  Procedure Laterality Date   ABDOMINAL HYSTERECTOMY     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   CARDIAC ELECTROPHYSIOLOGY MAPPING AND ABLATION     CARPAL TUNNEL RELEASE Bilateral    CHOLECYSTECTOMY  2008   COLONOSCOPY WITH PROPOFOL N/A 05/07/2017   Procedure: COLONOSCOPY WITH PROPOFOL;  Surgeon: [name], MD;  Location: [facility] ENDOSCOPY;  Service: Gastroenterology;  Laterality: N/A;   COLONOSCOPY WITH PROPOFOL N/A 12/19/2020   Procedure: COLONOSCOPY WITH PROPOFOL;  Surgeon: [name], MD;  Location: [facility] ENDOSCOPY;  Service: Gastroenterology;  Laterality: N/A;   ESOPHAGOGASTRODUODENOSCOPY (EGD) WITH PROPOFOL N/A 05/07/2017    Procedure: ESOPHAGOGASTRODUODENOSCOPY (EGD) WITH PROPOFOL;  Surgeon: [name], MD;  Location: [facility] ENDOSCOPY;  Service: Gastroenterology;  Laterality: N/A;   FOOT SURGERY Bilateral    INCONTINENCE SURGERY  2009   INTERSTIM IMPLANT PLACEMENT     other     over active bladder   OTHER SURGICAL HISTORY     bladder stimulator    PARTIAL HYSTERECTOMY  03/1996   fibroids   PORTA CATH INSERTION N/A 03/10/2019   Procedure: PORTA CATH INSERTION;  Surgeon: Algernon Huxley, MD;  Location: [facility] CV LAB;  Service: Cardiovascular;  Laterality: N/A;   TONSILLECTOMY  2007    Family History  Problem Relation Age of Onset   Colon cancer Father    Renal Disease Father    Diabetes Mellitus II Father    Melanoma Paternal Grandmother    Breast cancer Maternal Aunt 16   Anemia Mother    Heart disease Mother    Heart failure Mother    Renal Disease Mother    Congestive Heart Failure Mother    Heart disease Maternal Uncle    Throat cancer Maternal Uncle    Lung cancer Maternal Uncle    Liver disease Maternal Uncle    Heart failure Maternal Uncle    Hearing loss Son 57       Suicide     Social History:  reports that she quit smoking about 30 years ago. Her smoking use included cigarettes. She has a 20.00 pack-year smoking history. She has never used smokeless tobacco. She reports current alcohol use. She reports that she does not use drugs.  She is on disability. She notes exposure to perchloroethylene [facility]).   Allergies:  Allergies  Allergen Reactions   Oxycodone-Acetaminophen Anaphylaxis    Swelling and rash   Celebrex [Celecoxib] Diarrhea   Codeine    Plerixafor     In 2016 during ASCT collection patient developed fever to 103.28F and required hospitalization   Benadryl [Diphenhydramine] Palpitations   Morphine Itching and Rash   Ondansetron Diarrhea   Tylenol [Acetaminophen] Itching and Rash    Current Medications: Current Outpatient Medications  Medication Sig Dispense Refill    acyclovir (ZOVIRAX) 400 MG tablet Take 1 tablet (400 mg total) by mouth 2 (two) times daily. 180 tablet 1   allopurinol (ZYLOPRIM) 100 MG tablet TAKE 1 TABLET(100 MG) BY MOUTH DAILY as needed 90 tablet 1   atorvastatin (LIPITOR) 10 MG tablet Take 0.5 tablets (5 mg total) by mouth daily. 15 tablet 11   bisoprolol (ZEBETA) 5 MG tablet Take 1 tablet (5 mg total) by mouth daily. 30 tablet 2  clotrimazole (MYCELEX) 10 MG troche Take by mouth.     diclofenac sodium (VOLTAREN) 1 % GEL Apply 2 g topically 4 (four) times daily. 100 g 1   diphenoxylate-atropine (LOMOTIL) 2.5-0.025 MG tablet Take 2 tablets by mouth 4 (four) times daily as needed for diarrhea or loose stools. 64 tablet 3   doxycycline (VIBRAMYCIN) 50 MG capsule Take 50 mg by mouth 2 (two) times daily.     DULoxetine (CYMBALTA) 60 MG capsule Take 1 capsule (60 mg total) by mouth daily. 90 capsule 0   FLUoxetine (PROZAC) 40 MG capsule TAKE 1 CAPSULE EVERY DAY 90 capsule 0   furosemide (LASIX) 20 MG tablet Take 1 tablet (20 mg total) by mouth daily as needed for edema (If > 2 pounds in one day or >5 pounds over several days). 30 tablet 11   glipiZIDE (GLUCOTROL XL) 2.5 MG 24 hr tablet Take 1 tablet (2.5 mg total) by mouth daily with breakfast. 90 tablet 1   glucose blood test strip      guaiFENesin-dextromethorphan (ROBITUSSIN DM) 100-10 MG/5ML syrup Take 5 mLs by mouth every 4 (four) hours as needed for cough. 118 mL 0   montelukast (SINGULAIR) 10 MG tablet Take 1 tablet (10 mg total) by mouth See admin instructions. Take 1 tab daily for 2 days after daratumumab treatments 30 tablet 1   Multiple Vitamin (MULTIVITAMIN) tablet Take 1 tablet by mouth daily.     nitroGLYCERIN (NITROSTAT) 0.4 MG SL tablet Place 1 tablet (0.4 mg total) under the tongue every 5 (five) minutes as needed for chest pain. 20 tablet 12   omeprazole (PRILOSEC) 40 MG capsule TAKE 1 CAPSULE (40 MG TOTAL) BY MOUTH IN THE MORNING AND AT BEDTIME. 180 capsule 0    pentoxifylline (TRENTAL) 400 MG CR tablet Take 400 mg by mouth 3 (three) times daily.     tiZANidine (ZANAFLEX) 4 MG tablet Take 1 tablet (4 mg total) by mouth every 6 (six) hours as needed for muscle spasms. 90 tablet 0   traMADol (ULTRAM) 50 MG tablet Take 50 mg by mouth 2 (two) times daily.     traZODone (DESYREL) 100 MG tablet TAKE 1 TABLET AT BEDTIME 90 tablet 0   promethazine (PHENERGAN) 25 MG tablet Take 1 tablet (25 mg total) by mouth every 6 (six) hours as needed for nausea or vomiting. 90 tablet 1   No current facility-administered medications for this visit.   Facility-Administered Medications Ordered in Other Visits  Medication Dose Route Frequency Provider Last Rate Last Admin   heparin lock flush 100 unit/mL  500 Units Intracatheter Once PRN Earlie Server, MD       promethazine (PHENERGAN) 25 mg in sodium chloride 0.9 % 50 mL IVPB  25 mg Intravenous Once Earlie Server, MD       sodium chloride flush (NS) 0.9 % injection 10 mL  10 mL Intravenous PRN Earlie Server, MD   10 mL at 04/02/21 [id number]    Review of Systems  Constitutional:  Negative for chills, fever, malaise/fatigue and weight loss.  HENT:  Negative for sore throat.   Eyes:  Negative for redness.  Respiratory:  Negative for cough, shortness of breath and wheezing.   Cardiovascular:  Negative for chest pain, palpitations and leg swelling.  Gastrointestinal:  Positive for diarrhea (improved but still occasional) and nausea (occasional). Negative for abdominal pain, blood in stool and vomiting.  Genitourinary:  Negative for dysuria.  Musculoskeletal:  Positive for back pain and joint pain. Negative for myalgias.  Skin:  Negative for rash.  Neurological:  Positive for tingling and sensory change. Negative for dizziness and tremors.  Endo/Heme/Allergies:  Does not bruise/bleed easily.  Psychiatric/Behavioral:  Negative for hallucinations.     Performance status (ECOG): 1  Vitals Blood pressure (!) 142/84, pulse 62, temperature (!)  97.5 F (36.4 C), temperature source Tympanic, resp. rate 16, height $RemoveBe'5\' 2"'OvrolBLyu$  (1.575 m), weight 154 lb (69.9 kg), SpO2 100 %.  Physical Exam Vitals and nursing note reviewed.  Constitutional:      General: She is not in acute distress.    Appearance: She is well-developed. She is not ill-appearing, toxic-appearing or diaphoretic.     Interventions: Face mask in place.  HENT:     Head: Normocephalic and atraumatic.     Mouth/Throat:     Mouth: Mucous membranes are moist.     Pharynx: Oropharynx is clear.  Eyes:     General: No scleral icterus.    Pupils: Pupils are equal, round, and reactive to light.  Cardiovascular:     Rate and Rhythm: Normal rate and regular rhythm.     Heart sounds: Normal heart sounds. No murmur heard. Pulmonary:     Effort: Pulmonary effort is normal. No respiratory distress.     Breath sounds: Normal breath sounds. No wheezing or rales.  Chest:     Chest wall: No tenderness.  Abdominal:     General: Bowel sounds are normal. There is no distension.     Palpations: Abdomen is soft. There is no hepatomegaly, splenomegaly or mass.     Tenderness: There is no abdominal tenderness. There is no guarding or rebound.  Musculoskeletal:        General: No tenderness. Normal range of motion.     Cervical back: Normal range of motion and neck supple.     Right lower leg: No edema.     Left lower leg: No edema.  Lymphadenopathy:     Head:     Right side of head: No preauricular, posterior auricular or occipital adenopathy.     Left side of head: No preauricular, posterior auricular or occipital adenopathy.     Cervical: No cervical adenopathy.     Upper Body:     Right upper body: No supraclavicular adenopathy.     Left upper body: No supraclavicular adenopathy.  Skin:    General: Skin is warm and dry.  Neurological:     Mental Status: She is alert and oriented to person, place, and time. Mental status is at baseline.  Psychiatric:        Mood and Affect: Mood  normal.    Laboratory data Infusion on 02/18/2022  Component Date Value Ref Range Status   WBC 02/18/2022 8.2  4.0 - 10.5 K/uL Final   RBC 02/18/2022 4.53  3.87 - 5.11 MIL/uL Final   Hemoglobin 02/18/2022 11.1 (L)  12.0 - 15.0 g/dL Final   HCT 02/18/2022 35.3 (L)  36.0 - 46.0 % Final   MCV 02/18/2022 77.9 (L)  80.0 - 100.0 fL Final   MCH 02/18/2022 24.5 (L)  26.0 - 34.0 pg Final   MCHC 02/18/2022 31.4  30.0 - 36.0 g/dL Final   RDW 02/18/2022 15.2  11.5 - 15.5 % Final   Platelets 02/18/2022 151  150 - 400 K/uL Final   nRBC 02/18/2022 0.0  0.0 - 0.2 % Final   Neutrophils Relative % 02/18/2022 77  % Final   Neutro Abs 02/18/2022 6.3  1.7 - 7.7 K/uL Final  Lymphocytes Relative 02/18/2022 15  % Final   Lymphs Abs 02/18/2022 1.3  0.7 - 4.0 K/uL Final   Monocytes Relative 02/18/2022 6  % Final   Monocytes Absolute 02/18/2022 0.5  0.1 - 1.0 K/uL Final   Eosinophils Relative 02/18/2022 2  % Final   Eosinophils Absolute 02/18/2022 0.2  0.0 - 0.5 K/uL Final   Basophils Relative 02/18/2022 0  % Final   Basophils Absolute 02/18/2022 0.0  0.0 - 0.1 K/uL Final   Immature Granulocytes 02/18/2022 0  % Final   Abs Immature Granulocytes 02/18/2022 0.03  0.00 - 0.07 K/uL Final   Performed at [facility], [address] [place], [place] [address]    Assessment/Plan 1. Chronic nausea     # Recurrent lambda light chain multiple myeloma s/p second autologous bone marrow transplant [05/10/2020] Most recent Multiple myeloma showed negative M protein.  Light chain ratio is normal. 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  Attention on follow-up. Daratumumab on hold. Continue acyclovir prophylaxis.  -[place] transplant RN coordinator [phone]). No change in plan today   #Hypoglobulinemia post stem cell transplantation.  On IVIG infusion if IgG level less than 500. 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   Chronic and stable on nortriptyline- followed by neuo  #chronic diarrhea/nausea Chronic and improved following her hospitalization. Continue antidiarrheals/antiemetics PRN  #Chronic hypomagnesemia IV magnesium today given magnesium level of 1.0 Continue weekly monitoring and IV magnesium Continue oral slow-mag 1 tablet every other day   RTC:  IV magnesium today Holding IVF given recent acute on chronic CHF RTC 1 week APP, labs (Mag, CBC, BMP), +- IV mag    I discussed the assessment and treatment plan with the patient.  The patient was provided an opportunity to ask questions and all were answered.  The patient agreed with the plan and demonstrated an understanding of the instructions.  The patient was advised to call back if the symptoms worsen or if the condition fails to improve as anticipated.  Nelwyn Salisbury PA-C 02/18/2022

## 2022-02-18 NOTE — Progress Notes (Signed)
6 gm IV magnesium today. Tolerated well. Discharged to home at completion.

## 2022-02-19 NOTE — Telephone Encounter (Signed)
Requested Prescriptions  Pending Prescriptions Disp Refills  . traZODone (DESYREL) 100 MG tablet [Pharmacy Med Name: TRAZODONE HYDROCHLORIDE 100 MG Tablet] 90 tablet 0    Sig: TAKE 1 TABLET AT BEDTIME     Psychiatry: Antidepressants - Serotonin Modulator Passed - 02/18/2022  3:48 AM      Passed - Completed PHQ-2 or PHQ-9 in the last 360 days      Passed - Valid encounter within last 6 months    Recent Outpatient Visits          1 month ago Bacterial URI   [facility] [place], Earley Abide, MD   2 months ago Annual physical exam   [facility], Jesse Sans, MD   8 months ago Viral URI with cough   [facility], Jesse Sans, MD   8 months ago Exposure to influenza   [facility] at [facility], Jesse Sans, MD   11 months ago Pneumonia of right lower lobe due to infectious organism   [facility] at [facility], Jesse Sans, MD      Future Appointments            In 9 months [facility], Jesse Sans, MD [place] [facility] at [facility], [facility]

## 2022-02-20 DIAGNOSIS — Z823 Family history of stroke: Secondary | ICD-10-CM | POA: Diagnosis not present

## 2022-02-20 DIAGNOSIS — Z87891 Personal history of nicotine dependence: Secondary | ICD-10-CM | POA: Diagnosis not present

## 2022-02-20 DIAGNOSIS — I5032 Chronic diastolic (congestive) heart failure: Secondary | ICD-10-CM | POA: Diagnosis not present

## 2022-02-20 DIAGNOSIS — C9002 Multiple myeloma in relapse: Secondary | ICD-10-CM | POA: Diagnosis not present

## 2022-02-20 DIAGNOSIS — R188 Other ascites: Secondary | ICD-10-CM | POA: Diagnosis not present

## 2022-02-20 DIAGNOSIS — I350 Nonrheumatic aortic (valve) stenosis: Secondary | ICD-10-CM | POA: Diagnosis not present

## 2022-02-20 DIAGNOSIS — Z9484 Stem cells transplant status: Secondary | ICD-10-CM | POA: Diagnosis not present

## 2022-02-20 DIAGNOSIS — I493 Ventricular premature depolarization: Secondary | ICD-10-CM | POA: Diagnosis not present

## 2022-02-20 DIAGNOSIS — I509 Heart failure, unspecified: Secondary | ICD-10-CM | POA: Diagnosis not present

## 2022-02-20 DIAGNOSIS — R079 Chest pain, unspecified: Secondary | ICD-10-CM | POA: Diagnosis not present

## 2022-02-25 ENCOUNTER — Encounter: Payer: Self-pay | Admitting: Medical Oncology

## 2022-02-25 ENCOUNTER — Other Ambulatory Visit: Payer: Self-pay

## 2022-02-25 ENCOUNTER — Inpatient Hospital Stay: Payer: Medicare HMO

## 2022-02-25 ENCOUNTER — Other Ambulatory Visit: Payer: Medicare HMO

## 2022-02-25 ENCOUNTER — Inpatient Hospital Stay (HOSPITAL_BASED_OUTPATIENT_CLINIC_OR_DEPARTMENT_OTHER): Payer: Medicare HMO | Admitting: Nurse Practitioner

## 2022-02-25 VITALS — BP 120/70 | HR 80 | Resp 18

## 2022-02-25 VITALS — BP 127/63 | HR 126 | Temp 97.8°F | Resp 18 | Wt 153.4 lb

## 2022-02-25 DIAGNOSIS — B952 Enterococcus as the cause of diseases classified elsewhere: Secondary | ICD-10-CM | POA: Diagnosis not present

## 2022-02-25 DIAGNOSIS — I509 Heart failure, unspecified: Secondary | ICD-10-CM | POA: Diagnosis not present

## 2022-02-25 DIAGNOSIS — G620 Drug-induced polyneuropathy: Secondary | ICD-10-CM | POA: Diagnosis not present

## 2022-02-25 DIAGNOSIS — Z9484 Stem cells transplant status: Secondary | ICD-10-CM | POA: Diagnosis not present

## 2022-02-25 DIAGNOSIS — C9000 Multiple myeloma not having achieved remission: Secondary | ICD-10-CM

## 2022-02-25 DIAGNOSIS — R0602 Shortness of breath: Secondary | ICD-10-CM | POA: Diagnosis not present

## 2022-02-25 DIAGNOSIS — Z79899 Other long term (current) drug therapy: Secondary | ICD-10-CM | POA: Diagnosis not present

## 2022-02-25 DIAGNOSIS — E538 Deficiency of other specified B group vitamins: Secondary | ICD-10-CM | POA: Diagnosis not present

## 2022-02-25 DIAGNOSIS — D801 Nonfamilial hypogammaglobulinemia: Secondary | ICD-10-CM | POA: Diagnosis not present

## 2022-02-25 DIAGNOSIS — T451X5A Adverse effect of antineoplastic and immunosuppressive drugs, initial encounter: Secondary | ICD-10-CM | POA: Diagnosis not present

## 2022-02-25 DIAGNOSIS — D709 Neutropenia, unspecified: Secondary | ICD-10-CM | POA: Diagnosis not present

## 2022-02-25 LAB — CBC WITH DIFFERENTIAL/PLATELET
Abs Immature Granulocytes: 0.02 10*3/uL (ref 0.00–0.07)
Basophils Absolute: 0 10*3/uL (ref 0.0–0.1)
Basophils Relative: 0 %
Eosinophils Absolute: 0.2 10*3/uL (ref 0.0–0.5)
Eosinophils Relative: 2 %
HCT: 34 % — ABNORMAL LOW (ref 36.0–46.0)
Hemoglobin: 11.2 g/dL — ABNORMAL LOW (ref 12.0–15.0)
Immature Granulocytes: 0 %
Lymphocytes Relative: 21 %
Lymphs Abs: 1.5 10*3/uL (ref 0.7–4.0)
MCH: 25.3 pg — ABNORMAL LOW (ref 26.0–34.0)
MCHC: 32.9 g/dL (ref 30.0–36.0)
MCV: 76.7 fL — ABNORMAL LOW (ref 80.0–100.0)
Monocytes Absolute: 0.5 10*3/uL (ref 0.1–1.0)
Monocytes Relative: 7 %
Neutro Abs: 5 10*3/uL (ref 1.7–7.7)
Neutrophils Relative %: 70 %
Platelets: 128 10*3/uL — ABNORMAL LOW (ref 150–400)
RBC: 4.43 MIL/uL (ref 3.87–5.11)
RDW: 14.7 % (ref 11.5–15.5)
WBC: 7.2 10*3/uL (ref 4.0–10.5)
nRBC: 0 % (ref 0.0–0.2)

## 2022-02-25 LAB — BASIC METABOLIC PANEL
Anion gap: 11 (ref 5–15)
BUN: 36 mg/dL — ABNORMAL HIGH (ref 8–23)
CO2: 23 mmol/L (ref 22–32)
Calcium: 8.2 mg/dL — ABNORMAL LOW (ref 8.9–10.3)
Chloride: 100 mmol/L (ref 98–111)
Creatinine, Ser: 1.38 mg/dL — ABNORMAL HIGH (ref 0.44–1.00)
GFR, Estimated: 42 mL/min — ABNORMAL LOW (ref >60)
Glucose, Bld: 171 mg/dL — ABNORMAL HIGH (ref 70–99)
Potassium: 3.4 mmol/L — ABNORMAL LOW (ref 3.5–5.1)
Sodium: 134 mmol/L — ABNORMAL LOW (ref 135–145)

## 2022-02-25 LAB — MAGNESIUM: Magnesium: 1 mg/dL — ABNORMAL LOW (ref 1.7–2.4)

## 2022-02-25 MED ORDER — HEPARIN SOD (PORK) LOCK FLUSH 100 UNIT/ML IV SOLN
500.0000 [IU] | Freq: Once | INTRAVENOUS | Status: AC
  Administered 2022-02-25: 500 [IU] via INTRAVENOUS
  Filled 2022-02-25: qty 5

## 2022-02-25 MED ORDER — MAGNESIUM SULFATE 2 GM/50ML IV SOLN
2.0000 g | Freq: Once | INTRAVENOUS | Status: AC
  Administered 2022-02-25: 2 g via INTRAVENOUS
  Filled 2022-02-25: qty 50

## 2022-02-25 MED ORDER — SODIUM CHLORIDE 0.9 % IV SOLN
Freq: Once | INTRAVENOUS | Status: AC
  Filled 2022-02-25: qty 250

## 2022-02-25 MED ORDER — SODIUM CHLORIDE 0.9% FLUSH
10.0000 mL | Freq: Once | INTRAVENOUS | Status: AC
  Administered 2022-02-25: 10 mL via INTRAVENOUS
  Filled 2022-02-25: qty 10

## 2022-02-25 MED ORDER — SODIUM CHLORIDE 0.9 % IV SOLN: 6.0000 g | Freq: Once | INTRAVENOUS | Status: DC

## 2022-02-25 MED ORDER — SODIUM CHLORIDE 0.9 % IV SOLN
25.0000 mg | Freq: Once | INTRAVENOUS | Status: AC
  Administered 2022-02-25: 25 mg via INTRAVENOUS
  Filled 2022-02-25: qty 1

## 2022-02-25 MED ORDER — CYANOCOBALAMIN 1000 MCG/ML IJ SOLN
1000.0000 ug | Freq: Once | INTRAMUSCULAR | Status: AC
  Administered 2022-02-25: 1000 ug via INTRAMUSCULAR
  Filled 2022-02-25: qty 1

## 2022-02-25 MED ORDER — MAGNESIUM SULFATE 4 GM/100ML IV SOLN
4.0000 g | Freq: Once | INTRAVENOUS | Status: AC
  Administered 2022-02-25: 4 g via INTRAVENOUS
  Filled 2022-02-25: qty 100

## 2022-02-25 NOTE — Progress Notes (Signed)
Hematology/Oncology Progress Note [phone] [phone]  Clinic Day:  02/25/2022   Referring physician: Glean Hess, MD  Chief Complaint: Sarah [name] is a 66 y.o. 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   Initial bone marrow revealed 80% plasma cells.  Lambda free light chains were 1340.  She had nephrotic range proteinuria.  She initially underwent induction with RVD.  Revlimid maintenance was discontinued on 01/21/2017 secondary to intolerance.     07/19/2019 Bone marrow aspirate and biopsy on  revealed a normocellular marrow with but increased lambda-restricted plasma cells (9% aspirate, 40% CD138 immunohistochemistry).  Findings were consistent with recurrent plasma cell myeloma.  Flow cytometry revealed no monoclonal B-cell or phenotypically aberrant T-cell population. Cytogenetics were 31, XX (normal).  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    Lambda light chains have been followed: 22.2 (ratio 0.56) on 07/03/2017, 30.8 (ratio 0.78) on 09/02/2017, 36.9 (ratio 0.40) on 10/21/2017, 37.4 (ratio 0.41) on 12/16/2017, 70.7(ratio 0.31)  on 02/17/2018, 64.2 (ratio 0.27) on 04/07/2018, 78.9 (ratio 0.18) on 05/26/2018, 128.8 (ratio 0.17) on 08/06/2018, 181.5 (ratio 0.13) on 10/08/2018, 130.9 (ratio 0.13) on 10/20/2018, 160.7 (ratio 0.10) on  12/09/2018, 236.6 (ratio 0.07) on 02/01/2019, 363.6 (ratio 0.04) on 03/22/2019, 404.8 (ratio 0.04) on 04/05/2019, 420.7 (ratio 0.03) on 05/24/2019, 573.4 (ratio 0.03) on 06/23/2019, 451.05 (ratio 0.02) on 08/20/2019, 47.2 (ratio 0.13) on 10/25/2019, 22.4 (ratio 0.21) on 11/25/2019, 16.5 (ratio 0.33) on 01/10/2020, 14.6 (ratio 0.34) on 02/07/2020, 13.1 (ratio 0.31) on 03/07/2020, 10.1 (ratio 0.38) on 04/10/2020, and 9.5 (ratio 0.21) on 06/19/2020.   24 hour UPEP on 06/03/2019 revealed kappa free light chains 95.76, lambda free light chains 1,260.71, and ratio 0.08.  24 hour UPEP on 08/23/2019 revealed total protein of 782 mg/24 hrs with lambda free light chains 1,084.16 mg/L and ratio of 0.10 (1.03-31.76).  M spike in urine was 46.1% (361 mg/24 hrs).    Bone survey on 04/08/2016 and 05/28/2017 revealed no definite lytic lesion seen in the visualized skeleton.  Bone survey on 11/19/2018 revealed no suspicious lucent lesions and no acute bony abnormality.  PET scan on 07/12/2019 revealed no focal metabolic activity to suggest active myeloma within the skeleton. There were no lytic lesions identified on the CT portion of the exam or soft tissue plasmacytomas. There was no evidence of multiple myeloma.    Pretreatment RBC phenotype on 09/23/2019 was positive for C, e, DUFFY B, KIDD B, M, S, and s antigen; negative for c, E, KELL, DUFFY A, KIDD A, and N antigen.    09/27/2019 - 10/25/2019; 12/09/2019 - 03/13/2020 6 cycles of daratumumab and hyaluronidase-fihj, Pomalyst, and Decadron (DPd) .  Cycle #1 was complicated by fever and neutropenia requiring admission.  Cycle #2 was complicated by pneumonia requiring admission.  Cycle #6 was complicated with an ER evaluation for an elevated lactic acid.   05/10/2020 second autologous stem cell transplant at [facility]   She underwent conditioning with  melphalan 140 mg/m2.    She is s/p week #3 Velcade maintenance (began 08/31/2020 - 09/28/2020).  She has no increase in  neuropathy.  She has severe aortic stenosis.  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 Cardiology is following for a possible TAVR in the future.  Echo on 07/05/2020 revealed moderate to severe aortic stenosis with an EF of 50-55%.  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 Zometa was discontinued in 01/2017.  She has chronic nausea on Phenergan.     B12 deficiency.  B12 was 254 on 04/09/2017, 295 on 08/20/2018, and 391 on 10/08/2018.  She was on oral B12.  She received B12 monthly (last 09/14/2020).  Folate was 12.1 on 01/10/2020.    She has iron deficiency.  Ferritin was 32 on 07/01/2019.  She received Venofer on 07/15/2019 and 07/22/2019.   She has hypogammaglobulinemia.  IgG was 245 on 11/25/2019.  She received monthly IVIG (07/22/021 - 03/22/2020).  She received IVIG 400 mg/kg on 01/20/2020, 200 mg/kg on 02/17/2020, and 300 mg/kg on 03/22/2020.  IVIG on 46/96/2952 was complicated by acute renal failure. IgG trough level was 418 on 02/17/2020.  10/15/2019 - 10/20/2019 She was admitted to [facility] with fever and neutropenia.  Cultures were negative.  CXR was negative. She received broad spectrum antibiotics and daily Granix.  She received IVF for acute renal insufficiency due to diarrhea and dehydration.  Creatine was 1.66 on admission and 1.12 on discharge.    03/01/2020 - 03/05/2020 admitted to [facility] from with fever and neutropenia. CXR revealed no active cardiopulmonary disease. Chest CT with contrast revealed no acute intrathoracic pathology. There were findings which could be suggestive of prior granulomatous disease. She was treated with Cefepime and Vancomycin, then switched to ciprofloxacin on 03/03/2020.   05/08/2020 - 12/05/2021The patient was admitted to [facility] from  for autologous bone marrow transplant.   She received conditioning with melphalan 140 mg/m2.  Bone marrow transplant was on  05/10/2020. The patient developed fevers daily from 05/19/2020 - 05/24/2020. Blood cultures were + for strept sanguis bacteremia.  She was treated cefepime, vancomycin, and solumedrol for possible engraftment syndrome. Cefepime was switched to ceftriaxone; she completed 2 weeks of ceftriaxone on 06/03/2020. She had chemotherapy induced diarrhea.  She experienced urinary retention.  Feb 2022 patient has been on maintenance Velcade 2 mg every 2 weeks.  Chronic diarrhea seen by gastroenterology Dr. Vicente Males and was recommended to avoid artificial sugars in her diet including Diet Coke and coffee mate.  She feels some improvement of her diarrhea frequency since the dietary modification.  GI profile PCR negative, C. difficile toxin A+ B negative, fecal calprotectin 77 12/19/2020 colonoscopy showed 2 subcentimeter polyps in the ascending colon, resected and removed.  Pathology showed tubular adenoma.  No malignancy.  Normal mucosa in the entire examined colon.  Biopsied, pathology showed colonic mucosa with no significant pathological alteration.  Negative for active inflammation and features of chronicity.  Negative for microscopic colitis, dysplasia, and malignanc  Diabetes, patient has discontinued metformin due to diarrhea and started on glipizide 2.5 mg   03/05/2021-03/09/2021 Patient was hospitalized due to fever and chills, nonproductive cough, shortness of breath. X-ray showed right lower lobe atelectasis versus pneumonia.  Blood cultures negative.  Urine culture showed 60,000 colonies of Enterococcus facialis.  Patient received antibiotics. Patient also had abdominal pelvis CT scan for work-up of intermittent lower abdomen pain.No acute abnormality.  05/14/21 last dose of Velcade maintenance.  establish care with neurology Dr. Gurney Maxin and her neuropathy regimen has been switched from gabapentin to Lyrica. 05/29/2021 patient got influenza   neuropathy medication has been switched from  gabapentin/Lyrica to nortriptyline.  08/18/2021 Hospitalized due to pneumonia from metapnuemovirus, hospitalization was complicated with NSTEMI. She received heparin gtt.  Treated with antibiotics for coverage of pneumonia,  diuretics for pulmonary edema. She received IVIG 428m/kg x 1 for hypoglobulinemia. Discharged on 08/25/21 with Augmentin and Zithromax.   May 2023, Daratumumab being held for [facility] infectious disease physician Dr. GFatima Sangerdue to frequent infection.  INTERVAL HISTORY CMattye Verdoneis a 66y.o. female with recurrent lambda light chain multiple myeloms s/p second autologous bone marrow transplant 05/10/20 who returns to clinic for supportive care d/t chronic electrolyte abnormalities.      Past Medical History:  Diagnosis Date   Anemia    Anxiety    Aortic stenosis    a. 05/2020 Echo: EF 45-50%, sev AS - [facility] sugg of tricuspid valve w/ fusing of 2 leaflets-TAVR deferred in setting of acute infxn; b. 07/2020 Echo: EF 50-55%, mod-sev AS; c. 12/2020 Echo: EF>55%. Mod-sev paradoxical low-flow low-gradient AS; d. 08/2021 Echo: EF 35-40%, severe AS, triv AI, mild MR.   Arthritis    Bicuspid aortic valve    Bisphosphonate-associated osteonecrosis of the jaw ([place] 02/25/2017   Due to Zometa   Cardiomyopathy, idiopathic ([place]    a. Variable EF over time; b. 08/2017 Echo: EF 40%; b. 03/2020 Echo: EF 55-60%; c. 05/2020 Echo: EF 45-50%; d. 07/2020 Echo: EF 50-55%; e. 12/2020 Echo: EF>55%; f. 08/2021 Echo: EF 35-40%.   Chronic heart failure with preserved ejection fraction (HFpEF) ([place]    a. Variable EF over time; b. 08/2017 Echo: EF 40%; b. 03/2020 Echo: EF 55-60%; c. 05/2020 Echo: EF 45-50%; d. 07/2020 Echo: EF 50-55%; e. 12/2020 Echo: EF>55%; f. 08/2021 Echo: EF 35-40%, no RV, mildly dil LA, mild MR, triv AI, severe AS.   CKD (chronic kidney disease) stage 3, GFR 30-59 ml/min (HCC)    Depression    Diabetes mellitus (HCC)    Dizziness    Fatty liver    Frequent  falls    GERD (gastroesophageal reflux disease)    Gout    Heart murmur    History of blood transfusion    History of bone marrow transplant ([place]    History of uterine fibroid    Hx of cardiac catheterization    a. 01/2016 Cath ([facility]- after abnl nuc): Nl cors.   Hypertension    Hypomagnesemia    IDA (iron deficiency anemia)    Multiple myeloma (HCC)    Personal history of chemotherapy    PSVT (paroxysmal supraventricular tachycardia) ([place]    a. S/p [place]([facility].   PVC's (premature ventricular contractions)    a. Well-managed w/ bisoprolol in outpt setting.   Renal cyst     Past Surgical History:  Procedure Laterality Date   ABDOMINAL HYSTERECTOMY     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   CARDIAC ELECTROPHYSIOLOGY MAPPING AND ABLATION     CARPAL TUNNEL RELEASE Bilateral    CHOLECYSTECTOMY  2008   COLONOSCOPY WITH PROPOFOL N/A 05/07/2017   Procedure: COLONOSCOPY WITH PROPOFOL;  Surgeon: AJonathon Bellows MD;  [facility];  Service: Gastroenterology;  Laterality: N/A;   COLONOSCOPY WITH PROPOFOL N/A 12/19/2020   Procedure: COLONOSCOPY WITH PROPOFOL;  Surgeon: AJonathon Bellows MD;  Location: [facility] ENDOSCOPY;  Service: Gastroenterology;  Laterality: N/A;   ESOPHAGOGASTRODUODENOSCOPY (EGD) WITH PROPOFOL N/A 05/07/2017   Procedure: ESOPHAGOGASTRODUODENOSCOPY (EGD) WITH PROPOFOL;  Surgeon: [name], MD;  Location: [facility] ENDOSCOPY;  Service: Gastroenterology;  Laterality: N/A;   FOOT SURGERY Bilateral    INCONTINENCE SURGERY  2009   INTERSTIM IMPLANT PLACEMENT     other     over active bladder   OTHER SURGICAL HISTORY     bladder stimulator    PARTIAL HYSTERECTOMY  03/1996   fibroids   PORTA CATH INSERTION N/A 03/10/2019   Procedure: PORTA CATH INSERTION;  Surgeon: Algernon Huxley, MD;  Location: [place] [facility] LAB;  Service: Cardiovascular;  Laterality: N/A;   TONSILLECTOMY  2007    Family History  Problem Relation Age of Onset   Colon cancer Father    Renal  Disease Father    Diabetes Mellitus II Father    Melanoma Paternal Grandmother    Breast cancer Maternal Aunt 29   Anemia Mother    Heart disease Mother    Heart failure Mother    Renal Disease Mother    Congestive Heart Failure Mother    Heart disease Maternal Uncle    Throat cancer Maternal Uncle    Lung cancer Maternal Uncle    Liver disease Maternal Uncle    Heart failure Maternal Uncle    Hearing loss Son 56       Suicide     Social History:  reports that she quit smoking about 30 years ago. Her smoking use included cigarettes. She has a 20.00 pack-year smoking history. She has never used smokeless tobacco. She reports current alcohol use. She reports that she does not use drugs.  She is on disability. She notes exposure to perchloroethylene [facility]).   Allergies:  Allergies  Allergen Reactions   Oxycodone-Acetaminophen Anaphylaxis    Swelling and rash   Celebrex [Celecoxib] Diarrhea   Codeine    Plerixafor     In 2016 during ASCT collection patient developed fever to 103.69F and required hospitalization   Benadryl [Diphenhydramine] Palpitations   Morphine Itching and Rash   Ondansetron Diarrhea   Tylenol [Acetaminophen] Itching and Rash    Current Medications: Current Outpatient Medications  Medication Sig Dispense Refill   acyclovir (ZOVIRAX) 400 MG tablet Take 1 tablet (400 mg total) by mouth 2 (two) times daily. 180 tablet 1   allopurinol (ZYLOPRIM) 100 MG tablet TAKE 1 TABLET(100 MG) BY MOUTH DAILY as needed 90 tablet 1   atorvastatin (LIPITOR) 10 MG tablet Take 0.5 tablets (5 mg total) by mouth daily. 15 tablet 11   bisoprolol (ZEBETA) 5 MG tablet Take 1 tablet (5 mg total) by mouth daily. 30 tablet 2   clotrimazole (MYCELEX) 10 MG troche Take 10 mg by mouth 3 (three) times daily.     diphenoxylate-atropine (LOMOTIL) 2.5-0.025 MG tablet Take 2 tablets by mouth 4 (four) times daily as needed for diarrhea or loose stools. 64 tablet 3   doxycycline (VIBRAMYCIN) 50 MG  capsule Take 50 mg by mouth 2 (two) times daily.     DULoxetine (CYMBALTA) 60 MG capsule Take 1 capsule (60 mg total) by mouth daily. 90 capsule 0   FLUoxetine (PROZAC) 40 MG capsule TAKE 1 CAPSULE EVERY DAY 90 capsule 0   furosemide (LASIX) 20 MG tablet Take 1 tablet (20 mg total) by mouth daily as needed for edema (If > 2 pounds in one day or >5 pounds over several days). 30 tablet 11  glipiZIDE (GLUCOTROL XL) 2.5 MG 24 hr tablet Take 1 tablet (2.5 mg total) by mouth daily with breakfast. 90 tablet 1   glucose blood test strip      guaiFENesin-dextromethorphan (ROBITUSSIN DM) 100-10 MG/5ML syrup Take 5 mLs by mouth every 4 (four) hours as needed for cough. 118 mL 0   Multiple Vitamin (MULTIVITAMIN) tablet Take 1 tablet by mouth daily.     omeprazole (PRILOSEC) 40 MG capsule TAKE 1 CAPSULE (40 MG TOTAL) BY MOUTH IN THE MORNING AND AT BEDTIME. 180 capsule 0   pentoxifylline (TRENTAL) 400 MG CR tablet Take 400 mg by mouth 3 (three) times daily.     promethazine (PHENERGAN) 25 MG tablet Take 1 tablet (25 mg total) by mouth every 6 (six) hours as needed for nausea or vomiting. 90 tablet 1   tiZANidine (ZANAFLEX) 4 MG tablet Take 1 tablet (4 mg total) by mouth every 6 (six) hours as needed for muscle spasms. 90 tablet 0   traMADol (ULTRAM) 50 MG tablet Take 50 mg by mouth 2 (two) times daily.     traZODone (DESYREL) 100 MG tablet TAKE 1 TABLET AT BEDTIME 90 tablet 0   diclofenac sodium (VOLTAREN) 1 % GEL Apply 2 g topically 4 (four) times daily. (Patient not taking: Reported on 02/25/2022) 100 g 1   montelukast (SINGULAIR) 10 MG tablet Take 1 tablet (10 mg total) by mouth See admin instructions. Take 1 tab daily for 2 days after daratumumab treatments (Patient not taking: Reported on 02/25/2022) 30 tablet 1   nitroGLYCERIN (NITROSTAT) 0.4 MG SL tablet Place 1 tablet (0.4 mg total) under the tongue every 5 (five) minutes as needed for chest pain. (Patient not taking: Reported on 02/25/2022) 20 tablet 12    No current facility-administered medications for this visit.   Facility-Administered Medications Ordered in Other Visits  Medication Dose Route Frequency Provider Last Rate Last Admin   0.9 %  sodium chloride infusion   Intravenous Once Earlie Server, MD       heparin lock flush 100 unit/mL  500 Units Intravenous Once Earlie Server, MD       promethazine (PHENERGAN) 25 mg in sodium chloride 0.9 % 50 mL IVPB  25 mg Intravenous Once Earlie Server, MD       sodium chloride flush (NS) 0.9 % injection 10 mL  10 mL Intravenous PRN Earlie Server, MD   10 mL at 04/02/21 2458    Review of Systems  Constitutional:  Positive for malaise/fatigue. Negative for chills, fever and weight loss.  HENT:  Negative for sore throat.   Eyes:  Negative for redness.  Respiratory:  Negative for cough, shortness of breath and wheezing.   Cardiovascular:  Negative for chest pain, palpitations and leg swelling.  Gastrointestinal:  Positive for diarrhea (improved but still occasional) and nausea (occasional). Negative for abdominal pain, blood in stool and vomiting.  Genitourinary:  Negative for dysuria.  Musculoskeletal:  Positive for back pain and joint pain. Negative for myalgias.  Skin:  Negative for rash.  Neurological:  Positive for dizziness, tingling, sensory change and weakness. Negative for tremors.  Endo/Heme/Allergies:  Does not bruise/bleed easily.  Psychiatric/Behavioral:  Negative for depression and hallucinations.     Performance status (ECOG): 1  Vitals Blood pressure 127/63, pulse (!) 126, temperature 97.8 F (36.6 C), temperature source Tympanic, resp. rate 18, SpO2 100 %.  Physical Exam Nursing note reviewed.  Constitutional:      Appearance: She is not ill-appearing.  Eyes:  General: No scleral icterus. Cardiovascular:     Rate and Rhythm: Normal rate. Rhythm irregular.     Pulses: Normal pulses.  Pulmonary:     Effort: Pulmonary effort is normal. No respiratory distress.     Breath sounds: Normal  breath sounds.  Musculoskeletal:        General: No swelling or deformity.     Cervical back: Neck supple.  Lymphadenopathy:     Cervical: No cervical adenopathy.  Skin:    General: Skin is warm and dry.     Coloration: Skin is not pale.     Findings: No rash.  Neurological:     Mental Status: She is alert and oriented to person, place, and time.  Psychiatric:        Behavior: Behavior normal.        Thought Content: Thought content normal.        Judgment: Judgment normal.    Laboratory data No visits with results within 3 Day(s) from this visit.  Latest known visit with results is:  Infusion on 02/18/2022  Component Date Value Ref Range Status   Sodium 02/18/2022 136  135 - 145 mmol/L Final   Potassium 02/18/2022 3.6  3.5 - 5.1 mmol/L Final   Chloride 02/18/2022 101  98 - 111 mmol/L Final   CO2 02/18/2022 24  22 - 32 mmol/L Final   Glucose, Bld 02/18/2022 186 (H)  70 - 99 mg/dL Final   Glucose reference range applies only to samples taken after fasting for at least 8 hours.   BUN 02/18/2022 23  8 - 23 mg/dL Final   Creatinine, Ser 02/18/2022 1.35 (H)  0.44 - 1.00 mg/dL Final   Calcium 02/18/2022 9.1  8.9 - 10.3 mg/dL Final   Total Protein 02/18/2022 7.6  6.5 - 8.1 g/dL Final   Albumin 02/18/2022 4.2  3.5 - 5.0 g/dL Final   AST 02/18/2022 40  15 - 41 U/L Final   ALT 02/18/2022 41  0 - 44 U/L Final   Alkaline Phosphatase 02/18/2022 102  38 - 126 U/L Final   Total Bilirubin 02/18/2022 0.6  0.3 - 1.2 mg/dL Final   GFR, Estimated 02/18/2022 44 (L)  >60 mL/min Final   Comment: (NOTE) Calculated using the CKD-EPI Creatinine Equation (2021)    Anion gap 02/18/2022 11  5 - 15 Final   Performed at [facility], [place], [place] [address]   WBC 02/18/2022 8.2  4.0 - 10.5 K/uL Final   RBC 02/18/2022 4.53  3.87 - 5.11 MIL/uL Final   Hemoglobin 02/18/2022 11.1 (L)  12.0 - 15.0 g/dL Final   HCT 02/18/2022 35.3 (L)  36.0 - 46.0 % Final   MCV 02/18/2022 77.9  (L)  80.0 - 100.0 fL Final   MCH 02/18/2022 24.5 (L)  26.0 - 34.0 pg Final   MCHC 02/18/2022 31.4  30.0 - 36.0 g/dL Final   RDW 02/18/2022 15.2  11.5 - 15.5 % Final   Platelets 02/18/2022 151  150 - 400 K/uL Final   nRBC 02/18/2022 0.0  0.0 - 0.2 % Final   Neutrophils Relative % 02/18/2022 77  % Final   Neutro Abs 02/18/2022 6.3  1.7 - 7.7 K/uL Final   Lymphocytes Relative 02/18/2022 15  % Final   Lymphs Abs 02/18/2022 1.3  0.7 - 4.0 K/uL Final   Monocytes Relative 02/18/2022 6  % Final   Monocytes Absolute 02/18/2022 0.5  0.1 - 1.0 K/uL Final   Eosinophils Relative  02/18/2022 2  % Final   Eosinophils Absolute 02/18/2022 0.2  0.0 - 0.5 K/uL Final   Basophils Relative 02/18/2022 0  % Final   Basophils Absolute 02/18/2022 0.0  0.0 - 0.1 K/uL Final   Immature Granulocytes 02/18/2022 0  % Final   Abs Immature Granulocytes 02/18/2022 0.03  0.00 - 0.07 K/uL Final   Performed at [facility], [place], [place] [address]   Magnesium 02/18/2022 1.0 (L)  1.7 - 2.4 mg/dL Final   Performed at [facility], [address] [place], [place] [address]    Assessment/Plan 1. Multiple myeloma not having achieved remission ([place])    # Recurrent lambda light chain multiple myeloma s/p second autologous bone marrow transplant [05/10/2020]. MM panel recently was negative for M protein. Light chain ratio was normal  Immunofixation of serum protein showed IgM monoclonal protein with kappa light chain specificity, Discussed about a second clone of plasma cell disorders.  Attention on follow-up. Labs today are pending. Daratumumab is on hold. She continues acyclovir prophylaxis.  -[place] transplant RN coordinator [phone]).   # Hypoglobulinemia post stem cell transplantation.  On IVIG infusion if IgG level less than 500. Labs pending. Hold IVIG for now.     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   Follows up with neurology. Now on  nortriptyline.   # Chronic diarrhea- continue antidiarrheals as needed.    # Chronic hypomagnesemia proceed with  IV magnesium today.  Continue weekly magnesium +/- IV magnesium.   Continue Slow-Mag 1 tab every other day.    # Dysphagia & chronic nausea, antiemetics as needed.  # B12 deficiency-  b12 on 02/04/22 was 338. Restart monthly injections.   # CKD- creatinine slightly worse.  Encourage oral hydration.  # Hypocalcemia- start calcium supplement.   # Heart failure- managed by cardiology. Minimize IVF.   # Irregular heart rate- hx of PVCs. Will check ekg today given dizziness and weakness. EKG was reviewed and similar to previous. No evidence of a fib. Persistent PVCs. Follow up with cardiology.     RTC:  B12 today & EKG IV mag today 1 week Mag/IV mag weekly x 3 2 weeks Mag/IV mag weekly x 3 3 weeks Mag/IV mag weekly x 3 4 weeks- lab (MM Panel, KLLC, CBC, CMP, Mag), Dr. Tasia Catchings, +/- magnesium  I discussed the assessment and treatment plan with the patient.  The patient was provided an opportunity to ask questions and all were answered.  The patient agreed with the plan and demonstrated an understanding of the instructions.  The patient was advised to call back if the symptoms worsen or if the condition fails to improve as anticipated.   Beckey Rutter, DNP, AGNP-C [place] at [facility] [phone] (clinic) 02/25/2022  Dr. Tasia Catchings & Dr. Marylen Ponto

## 2022-02-25 NOTE — Progress Notes (Signed)
Received 6 grams of Magnesium and supportive meds. Denies any Chest pain or dyspnea. Rested quietly in clinic. VSS. Discharged to home.

## 2022-02-25 NOTE — Progress Notes (Signed)
Irregular heart beat upon arrival. Pt denies any chest pain. Heart rate on monitor goes from 125 to 84; dropped down to 50, 49 and back up to 80's.  Pt reports intermittent episodes of chronic diarrhea and nausea.

## 2022-02-26 LAB — KAPPA/LAMBDA LIGHT CHAINS
Kappa free light chain: 4.9 mg/L (ref 3.3–19.4)
Kappa, lambda light chain ratio: 0.75 (ref 0.26–1.65)
Lambda free light chains: 6.5 mg/L (ref 5.7–26.3)

## 2022-03-01 ENCOUNTER — Ambulatory Visit (INDEPENDENT_AMBULATORY_CARE_PROVIDER_SITE_OTHER): Payer: Medicare HMO

## 2022-03-01 ENCOUNTER — Other Ambulatory Visit: Payer: Self-pay

## 2022-03-01 DIAGNOSIS — Z0000 Encounter for general adult medical examination without abnormal findings: Secondary | ICD-10-CM | POA: Diagnosis not present

## 2022-03-01 DIAGNOSIS — M47816 Spondylosis without myelopathy or radiculopathy, lumbar region: Secondary | ICD-10-CM

## 2022-03-01 LAB — MULTIPLE MYELOMA PANEL, SERUM
Albumin SerPl Elph-Mcnc: 3.8 g/dL (ref 2.9–4.4)
Albumin/Glob SerPl: 1.4 (ref 0.7–1.7)
Alpha 1: 0.2 g/dL (ref 0.0–0.4)
Alpha2 Glob SerPl Elph-Mcnc: 1 g/dL (ref 0.4–1.0)
B-Globulin SerPl Elph-Mcnc: 1 g/dL (ref 0.7–1.3)
Gamma Glob SerPl Elph-Mcnc: 0.7 g/dL (ref 0.4–1.8)
Globulin, Total: 2.9 g/dL (ref 2.2–3.9)
IgA: 21 mg/dL — ABNORMAL LOW (ref 87–352)
IgG (Immunoglobin G), Serum: 746 mg/dL (ref 586–1602)
IgM (Immunoglobulin M), Srm: 14 mg/dL — ABNORMAL LOW (ref 26–217)
Total Protein ELP: 6.7 g/dL (ref 6.0–8.5)

## 2022-03-01 MED ORDER — TIZANIDINE HCL 4 MG PO TABS: 4.0000 mg | ORAL_TABLET | Freq: Four times a day (QID) | ORAL | 0 refills | Status: DC | PRN

## 2022-03-01 NOTE — Patient Instructions (Signed)

## 2022-03-01 NOTE — Progress Notes (Signed)
I connected with  [name] on 03/01/22 by a audio enabled telemedicine application and verified that I am speaking with the correct person using two identifiers.  Patient Location: Home  Provider Location: Office/Clinic  I discussed the limitations of evaluation and management by telemedicine. The patient expressed understanding and agreed to proceed.   Subjective:   Sarah [name] is a 66 y.o. female who presents for Medicare Annual (Subsequent) preventive examination.  Review of Systems    Per HPI unless specifically indicated below Cardiac Risk Factors include: advanced age (>70mn, >>19women);female gender, hypertension, CAD, and hyperlipidemia.          Objective:       02/25/2022    1:40 PM 02/25/2022    9:13 AM 02/25/2022    9:00 AM  Vitals with BMI  Weight  153 lbs 6 oz   BMI  297.84  Systolic [id number] Diastolic 70 63 1138 Pulse 80 126 125    Today's Vitals   03/01/22 0831  PainSc: 5    There is no height or weight on file to calculate BMI.     02/25/2022    9:08 AM 02/18/2022    9:07 AM 02/13/2022    8:21 AM 02/04/2022   10:00 PM 01/28/2022   10:52 AM 01/21/2022    9:27 AM 12/25/2021    8:30 AM  Advanced Directives  Does Patient Have a Medical Advance Directive? _0  No Yes  Type of [place]of [place] will [place] will  Healthcare Power of [place] will    Does patient want to make changes to medical advance directive? No - Patient declined  No - Patient declined No - Patient declined     Copy of HHustlerin Chart? No - copy requested        Would patient like information on creating a medical advance directive? No - Patient declined          Current Medications (verified) Outpatient Encounter Medications as of 03/01/2022  Medication Sig   acyclovir (ZOVIRAX) 400 MG tablet Take 1 tablet (400 mg total) by mouth 2  (two) times daily.   allopurinol (ZYLOPRIM) 100 MG tablet TAKE 1 TABLET(100 MG) BY MOUTH DAILY as needed   atorvastatin (LIPITOR) 10 MG tablet Take 0.5 tablets (5 mg total) by mouth daily.   bisoprolol (ZEBETA) 5 MG tablet Take 1 tablet (5 mg total) by mouth daily.   clotrimazole (MYCELEX) 10 MG troche Take 10 mg by mouth 3 (three) times daily as needed.   diphenoxylate-atropine (LOMOTIL) 2.5-0.025 MG tablet Take 2 tablets by mouth 4 (four) times daily as needed for diarrhea or loose stools.   doxycycline (VIBRAMYCIN) 50 MG capsule Take 50 mg by mouth 2 (two) times daily.   DULoxetine (CYMBALTA) 60 MG capsule Take 1 capsule (60 mg total) by mouth daily.   FLUoxetine (PROZAC) 40 MG capsule TAKE 1 CAPSULE EVERY DAY   furosemide (LASIX) 20 MG tablet Take 1 tablet (20 mg total) by mouth daily as needed for edema (If > 2 pounds in one day or >5 pounds over several days). (Patient taking differently: Take 40 mg by mouth daily.)   glipiZIDE (GLUCOTROL XL) 2.5 MG 24 hr tablet Take 1 tablet (2.5 mg total) by mouth daily with breakfast.   glucose blood test strip    guaiFENesin-dextromethorphan (ROBITUSSIN DM) 100-10 MG/5ML syrup Take 5 mLs by mouth  every 4 (four) hours as needed for cough.   montelukast (SINGULAIR) 10 MG tablet Take 1 tablet (10 mg total) by mouth See admin instructions. Take 1 tab daily for 2 days after daratumumab treatments   Multiple Vitamin (MULTIVITAMIN) tablet Take 1 tablet by mouth daily.   nitroGLYCERIN (NITROSTAT) 0.4 MG SL tablet Place 1 tablet (0.4 mg total) under the tongue every 5 (five) minutes as needed for chest pain.   omeprazole (PRILOSEC) 40 MG capsule TAKE 1 CAPSULE (40 MG TOTAL) BY MOUTH IN THE MORNING AND AT BEDTIME.   pentoxifylline (TRENTAL) 400 MG CR tablet Take 400 mg by mouth 3 (three) times daily.   promethazine (PHENERGAN) 25 MG tablet Take 1 tablet (25 mg total) by mouth every 6 (six) hours as needed for nausea or vomiting.   tiZANidine (ZANAFLEX) 4 MG tablet  Take 1 tablet (4 mg total) by mouth every 6 (six) hours as needed for muscle spasms.   traMADol (ULTRAM) 50 MG tablet Take 50 mg by mouth 2 (two) times daily.   traZODone (DESYREL) 100 MG tablet TAKE 1 TABLET AT BEDTIME   diclofenac sodium (VOLTAREN) 1 % GEL Apply 2 g topically 4 (four) times daily. (Patient not taking: Reported on 02/25/2022)   Facility-Administered Encounter Medications as of 03/01/2022  Medication   sodium chloride flush (NS) 0.9 % injection 10 mL    Allergies (verified) Oxycodone-acetaminophen, Celebrex [celecoxib], Codeine, Plerixafor, Benadryl [diphenhydramine], Morphine, Ondansetron, and Tylenol [acetaminophen]   History: Past Medical History:  Diagnosis Date   Anemia    Anxiety    Aortic stenosis    a. 05/2020 Echo: EF 45-50%, sev AS - seen by TAVR team @ [facility] - CTA sugg of tricuspid valve w/ fusing of 2 leaflets-TAVR deferred in setting of acute infxn; b. 07/2020 Echo: EF 50-55%, mod-sev AS; c. 12/2020 Echo: EF>55%. Mod-sev paradoxical low-flow low-gradient AS; d. 08/2021 Echo: EF 35-40%, severe AS, triv AI, mild MR.   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 Variable EF over time; b. 08/2017 Echo: EF 40%; b. 03/2020 Echo: EF 55-60%; c. 05/2020 Echo: EF 45-50%; d. 07/2020 Echo: EF 50-55%; e. 12/2020 Echo: EF>55%; f. 08/2021 Echo: EF 35-40%.   Chronic heart failure with preserved ejection fraction (HFpEF) ([place])    a. Variable EF over time; b. 08/2017 Echo: EF 40%; b. 03/2020 Echo: EF 55-60%; c. 05/2020 Echo: EF 45-50%; d. 07/2020 Echo: EF 50-55%; e. 12/2020 Echo: EF>55%; f. 08/2021 Echo: EF 35-40%, no RV, mildly dil LA, mild MR, triv AI, severe AS.   CKD (chronic kidney disease) stage 3, GFR 30-59 ml/min (HCC)    Depression    Diabetes mellitus (HCC)    Dizziness    Fatty liver    Frequent falls    GERD (gastroesophageal reflux disease)    Gout    Heart  murmur    History of blood transfusion    History of bone marrow transplant ([place])    History of uterine fibroid    Hx of cardiac catheterization    a. 01/2016 [facility] - after abnl nuc): Nl cors.   Hypertension    Hypomagnesemia    IDA (iron deficiency anemia)    Multiple myeloma (HCC)    Personal history of chemotherapy    PSVT (paroxysmal supraventricular tachycardia) ([place])    a. S/p [facility]).   PVC's (premature  ventricular contractions)    a. Well-managed w/ bisoprolol in outpt setting.   Renal cyst    Past Surgical History:  Procedure Laterality Date   ABDOMINAL HYSTERECTOMY     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   CARDIAC ELECTROPHYSIOLOGY MAPPING AND ABLATION     CARPAL TUNNEL RELEASE Bilateral    CHOLECYSTECTOMY  2008   COLONOSCOPY WITH PROPOFOL N/A 05/07/2017   Procedure: COLONOSCOPY WITH PROPOFOL;  Surgeon: [name], MD;  Location[facility] ENDOSCOPY;  Service: Gastroenterology;  Laterality: N/A;   COLONOSCOPY WITH PROPOFOL N/A 12/19/2020   Procedure: COLONOSCOPY WITH PROPOFOL;  Surgeon: [name], MD;  Location: [facility] ENDOSCOPY;  Service: Gastroenterology;  Laterality: N/A;   ESOPHAGOGASTRODUODENOSCOPY (EGD) WITH PROPOFOL N/A 05/07/2017   Procedure: ESOPHAGOGASTRODUODENOSCOPY (EGD) WITH PROPOFOL;  Surgeon: [name], MD;  Location: [facility] ENDOSCOPY;  Service: Gastroenterology;  Laterality: N/A;   FOOT SURGERY Bilateral    INCONTINENCE SURGERY  2009   INTERSTIM IMPLANT PLACEMENT     other     over active bladder   OTHER SURGICAL HISTORY     bladder stimulator    PARTIAL HYSTERECTOMY  03/1996   fibroids   PORTA CATH INSERTION N/A 03/10/2019   Procedure: PORTA CATH INSERTION;  Surgeon: Algernon Huxley, MD;  Location: [place] CV LAB;  Service: Cardiovascular;  Laterality: N/A;   TONSILLECTOMY  2007   Family History  Problem Relation Age of Onset   Colon cancer Father    Renal Disease Father    Diabetes Mellitus II Father    Melanoma Paternal  Grandmother    Breast cancer Maternal Aunt 55   Anemia Mother    Heart disease Mother    Heart failure Mother    Renal Disease Mother    Congestive Heart Failure Mother    Heart disease Maternal Uncle    Throat cancer Maternal Uncle    Lung cancer Maternal Uncle    Liver disease Maternal Uncle    Heart failure Maternal Uncle    Hearing loss Son 72       Suicide    Social History   Socioeconomic History   Marital status: Widowed    Spouse name: Not on file   Number of children: 2   Years of education: Not on file   Highest education level: Not on file  Occupational History   Occupation: Disabled  Tobacco Use   Smoking status: Former    Packs/day: 1.00    Years: 20.00    Total pack years: 20.00    Types: Cigarettes    Quit date: 07/02/1991    Years since quitting: 30.6   Smokeless tobacco: Never  Vaping Use   Vaping Use: Never used  Substance and Sexual Activity   Alcohol use: Yes    Comment: occasionally   Drug use: No   Sexual activity: Never  Other Topics Concern   Not on file  Social History Narrative   Pt lives with her 2 sisters   Social Determinants of Health   Financial Resource Strain: High Risk (03/01/2022)   Overall Financial Resource Strain (CARDIA)    Difficulty of Paying Living Expenses: Very hard  Food Insecurity: No Food Insecurity (03/01/2022)   Hunger Vital Sign    Worried About Running Out of Food in the Last Year: Never true    [place] in the Last Year: Never true  Transportation Needs: No Transportation Needs (03/01/2022)   PRAPARE - Hydrologist (Medical): No  Lack of Transportation (Non-Medical): No  Physical Activity: Insufficiently Active (03/01/2022)   Exercise Vital Sign    Days of Exercise per Week: 7 days    Minutes of Exercise per Session: 10 min  Stress: Stress Concern Present (02/07/2020)   [place]    Feeling of Stress : Rather  much  Social Connections: Socially Isolated (03/01/2022)   Social Connection and Isolation Panel [NHANES]    Frequency of Communication with Friends and Family: More than three times a week    Frequency of Social Gatherings with Friends and Family: Twice a week    Attends Religious Services: Never    Marine scientist or Organizations: No    Attends Archivist Meetings: Never    Marital Status: Widowed    Tobacco Counseling Counseling given: Not Answered   Clinical Intake:     Pain : 0-10 Pain Score: 5  Pain Type: Chronic pain Pain Location: Back Pain Orientation: Lower Pain Descriptors / Indicators: Aching, Radiating Pain Onset: More than a month ago Pain Frequency: Intermittent Pain Relieving Factors: Pain is releived at times with resting  Pain Relieving Factors: Pain is releived at times with resting  Nutritional Status: BMI 25 -29 Overweight Nutritional Risks: Nausea/ vomitting/ diarrhea Diabetes: Yes CBG done?: No Did pt. bring in CBG monitor from home?: No  How often do you need to have someone help you when you read instructions, pamphlets, or other written materials from your doctor or pharmacy?: 1 - Never  Diabetic?Nutrition Risk Assessment:  Has the patient had any N/V/D within the last 2 months?  Yes  Does the patient have any non-healing wounds?  No  Has the patient had any unintentional weight loss or weight gain?  No   Diabetes:  Is the patient diabetic?  Yes  If diabetic, was a CBG obtained today?  No  Did the patient bring in their glucometer from home?   Virtual appt How often do you monitor your CBG's? occasionally.   Financial Strains and Diabetes Management:  Are you having any financial strains with the device, your supplies or your medication? No .  Does the patient want to be seen by Chronic Care Management for management of their diabetes?  No  Would the patient like to be referred to a Nutritionist or for Diabetic  Management?  No   Diabetic Exams:  Diabetic Eye Exam: Overdue for diabetic eye exam. Pt has been advised about the importance in completing this exam. Patient advised to call and schedule an eye exam. [facility] Diabetic Foot Exam: Completed 12/05/20, overdue for a diabetic foot exam.     Interpreter Needed?: No  Information entered by :: Donnie Mesa, [place]   Activities of Daily Living    03/01/2022    8:29 AM 03/01/2022    8:28 AM  In your present state of health, do you have any difficulty performing the following activities:  Hearing? 0 0  Vision? 0 1  Difficulty concentrating or making decisions? 1 0  Walking or climbing stairs? 1 1  Dressing or bathing? 0 0  Doing errands, shopping? 0 0    Patient Care Team: Glean Hess, MD as PCP - General (Internal Medicine) Jodell Cipro, MD as Referring Physician (Pain Medicine) Jennye Boroughs, MD as Consulting Physician (Cardiology) Earlie Server, MD as Consulting Physician (Oncology)  Indicate any recent Medical Services you may have received from other than [facility] providers in the past year (date  may be approximate).  Thr pt was admitted in [facility] on 02/04/22 for chest pain.     Assessment:   This is a routine wellness examination for [place].  Hearing/Vision screen Denies any hearing issues  Wear glasses, but denies any vision issues. Dues for an Annual Eye Exam. Last Eye Exam for about 1.5 years ago at [facility].  Dietary issues and exercise activities discussed: Current Exercise Habits: Structured exercise class, Type of exercise: stretching, Time (Minutes): 10, Frequency (Times/Week): 7, Weekly Exercise (Minutes/Week): 70, Intensity: Mild, Exercise limited by: orthopedic condition(s)   Goals Addressed   None    Depression Screen    03/01/2022    8:24 AM 12/07/2021    9:32 AM 06/22/2021    2:03 PM 05/28/2021    4:26 PM 03/13/2021    2:08 PM 12/05/2020    8:06 AM 04/03/2020    9:23 AM  PHQ 2/9 Scores  PHQ - 2 Score _0 PHQ- 9 Score _1 Fall Risk    03/01/2022    8:30 AM 03/01/2022    8:29 AM 03/01/2022    8:06 AM 12/07/2021    9:32 AM 06/22/2021    2:03 PM  [place] in the past year? 0 0 0 1 1  Number falls in past yr:  0 0 1 1  Injury with Fall?  0 1 0 1  Risk for fall due to :  No Fall Risks  History of fall(s) History of fall(s)  Follow up  Falls evaluation completed  Falls evaluation completed Falls evaluation completed    [place]:  Any stairs in or around the home? Yes  If so, are there any without handrails? No  Home free of loose throw rugs in walkways, pet beds, electrical cords, etc? Yes  Adequate lighting in your home to reduce risk of falls? Yes   ASSISTIVE DEVICES UTILIZED TO PREVENT FALLS:  Life alert? No  Use of a cane, walker or w/c? No  Grab bars in the bathroom? No  Shower chair or bench in shower? No  Elevated toilet seat or a handicapped toilet? No   TIMED UP AND GO:  Was the test performed?  unable to preform, virtual appt .  Cognitive Function:        03/01/2022    8:30 AM  6CIT Screen  What Year? 0 points  What month? 0 points  What time? 0 points  Count back from 20 0 points  Months in reverse 0 points  Repeat phrase 0 points  Total Score 0 points    Immunizations Immunization History  Administered Date(s) Administered   DTaP / Hep B / IPV 11/14/2020, 07/24/2021   Fluad Quad(high Dose 65+) 03/13/2021   Hepatitis B, adult 07/09/2016, 09/10/2016, 07/08/2017   HiB (PRP-T) 07/09/2016, 09/10/2016, 07/08/2017, 11/14/2020, 07/24/2021   IPV 07/09/2016, 09/10/2016, 07/08/2017   Influenza,inj,Quad PF,6+ Mos 03/23/2015, 06/18/2016, 04/08/2017, 06/30/2017, 03/31/2018, 04/12/2019, 04/03/2020   MMR 07/08/2017   PFIZER(Purple Top)SARS-COV-2 Vaccination 09/20/2019, 10/09/2019, 01/09/2021   PNEUMOCOCCAL CONJUGATE-20 07/24/2021   Pneumococcal Conjugate-13 03/23/2015, 07/09/2016, 01/07/2017, 03/11/2017,  11/14/2020   Pneumococcal Polysaccharide-23 07/08/2017   Td 08/20/2020   Tdap 07/09/2016, 09/10/2016, 02/12/2018   Zoster Recombinat (Shingrix) 11/19/2017, 02/12/2018    TDAP status: Up to date  Flu Vaccine status: Due, Education has been provided regarding the importance of this vaccine. Advised may receive this vaccine at  local pharmacy or Health Dept. Aware to provide a copy of the vaccination record if obtained from local pharmacy or Health Dept. Verbalized acceptance and understanding.  Pneumococcal vaccine status: Up to date  Covid-19 vaccine status: Completed vaccines  Qualifies for Shingles Vaccine? Yes   Zostavax completed Yes   Shingrix Completed?: Yes  Screening Tests Health Maintenance  Topic Date Due   OPHTHALMOLOGY EXAM  07/26/2020   COVID-19 Vaccine (4 - Pfizer risk series) 03/06/2021   DEXA SCAN  Never done   FOOT EXAM  12/05/2021   MAMMOGRAM  12/06/2021   INFLUENZA VACCINE  01/29/2022   HEMOGLOBIN A1C  06/08/2022   URINE MICROALBUMIN  12/08/2022   COLONOSCOPY (Pts 45-35yr Insurance coverage will need to be confirmed)  12/20/2027   TETANUS/TDAP  08/20/2030   Pneumonia Vaccine 65 Years old  Completed   Hepatitis C Screening  Completed   HIV Screening  Completed   Zoster Vaccines- Shingrix  Completed   HPV VACCINES  Aged Out    Health Maintenance  Health Maintenance Due  Topic Date Due   OPHTHALMOLOGY EXAM  07/26/2020   COVID-19 Vaccine (4 - Pfizer risk series) 03/06/2021   DEXA SCAN  Never done   FOOT EXAM  12/05/2021   MAMMOGRAM  12/06/2021   INFLUENZA VACCINE  01/29/2022    Colorectal cancer screening: Type of screening: Colonoscopy. Completed 12/19/2020. Repeat every 7 years  Mammogram status: Completed 12/07/2019. Repeat every year    Lung Cancer Screening: (Low Dose CT Chest recommended if Age 65-80years, 30 pack-year currently smoking OR have quit w/in 15years.) does not qualify.   Lung Cancer Screening Referral:   Additional  Screening:  Hepatitis C Screening: does qualify; Completed 06/11/2018  Vision Screening: Recommended annual ophthalmology exams for early detection of glaucoma and other disorders of the eye. Is the patient up to date with their annual eye exam?  Yes  Who is the provider or what is the name of the office in which the patient attends annual eye exams? [facility] If pt is not established with a provider, would they like to be referred to a provider to establish care? No .   Dental Screening: Recommended annual dental exams for proper oral hygiene  Community Resource Referral / Chronic Care Management: CRR required this visit?  No   CCM required this visit?  No      Plan:     I have personally reviewed and noted the following in the patient's chart:   Medical and social history Use of alcohol, tobacco or illicit drugs  Current medications and supplements including opioid prescriptions. Patient is not currently taking opioid prescriptions. Functional ability and status Nutritional status Physical activity Advanced directives List of other physicians Hospitalizations, surgeries, and ER visits in previous 12 months Vitals Screenings to include cognitive, depression, and falls Referrals and appointments  Ms. Ericksen , Thank you for taking time to come for your Medicare Wellness Visit. I appreciate your ongoing commitment to your health goals. Please review the following plan we discussed and let me know if I can assist you in the future.   These are the goals we discussed:  Goals   None     This is a list of the screening recommended for you and due dates:  Health Maintenance  Topic Date Due   Eye exam for diabetics  07/26/2020   COVID-19 Vaccine (4 - Pfizer risk series) 03/06/2021   DEXA scan (bone density measurement)  Never done   Complete foot  exam   12/05/2021   Mammogram  12/06/2021   Flu Shot  01/29/2022   Hemoglobin A1C  06/08/2022   Urine Protein Check   12/08/2022   Colon Cancer Screening  12/20/2027   Tetanus Vaccine  08/20/2030   Pneumonia Vaccine  Completed   Hepatitis C Screening: USPSTF Recommendation to screen - Ages 18-79 yo.  Completed   HIV Screening  Completed   Zoster (Shingles) Vaccine  Completed   HPV Vaccine  Aged [address], [place]   03/01/2022   Nurse Notes: 30 minute Non-Face-To-Face visit. The pt requesting refills on some of her medications. I will send separate refill request.

## 2022-03-05 ENCOUNTER — Inpatient Hospital Stay: Payer: Medicare HMO

## 2022-03-05 ENCOUNTER — Other Ambulatory Visit: Payer: Self-pay

## 2022-03-05 ENCOUNTER — Inpatient Hospital Stay: Payer: Medicare HMO | Attending: Oncology

## 2022-03-05 ENCOUNTER — Telehealth: Payer: Self-pay

## 2022-03-05 VITALS — BP 110/72 | HR 90 | Temp 97.1°F

## 2022-03-05 DIAGNOSIS — G620 Drug-induced polyneuropathy: Secondary | ICD-10-CM | POA: Insufficient documentation

## 2022-03-05 DIAGNOSIS — R197 Diarrhea, unspecified: Secondary | ICD-10-CM | POA: Insufficient documentation

## 2022-03-05 DIAGNOSIS — Z87891 Personal history of nicotine dependence: Secondary | ICD-10-CM | POA: Diagnosis not present

## 2022-03-05 DIAGNOSIS — D801 Nonfamilial hypogammaglobulinemia: Secondary | ICD-10-CM | POA: Diagnosis not present

## 2022-03-05 DIAGNOSIS — N1831 Chronic kidney disease, stage 3a: Secondary | ICD-10-CM | POA: Insufficient documentation

## 2022-03-05 DIAGNOSIS — E538 Deficiency of other specified B group vitamins: Secondary | ICD-10-CM | POA: Insufficient documentation

## 2022-03-05 DIAGNOSIS — Z886 Allergy status to analgesic agent status: Secondary | ICD-10-CM | POA: Diagnosis not present

## 2022-03-05 DIAGNOSIS — K219 Gastro-esophageal reflux disease without esophagitis: Secondary | ICD-10-CM | POA: Diagnosis not present

## 2022-03-05 DIAGNOSIS — I350 Nonrheumatic aortic (valve) stenosis: Secondary | ICD-10-CM | POA: Diagnosis not present

## 2022-03-05 DIAGNOSIS — T451X5A Adverse effect of antineoplastic and immunosuppressive drugs, initial encounter: Secondary | ICD-10-CM | POA: Insufficient documentation

## 2022-03-05 DIAGNOSIS — C9000 Multiple myeloma not having achieved remission: Secondary | ICD-10-CM

## 2022-03-05 DIAGNOSIS — Z9049 Acquired absence of other specified parts of digestive tract: Secondary | ICD-10-CM | POA: Diagnosis not present

## 2022-03-05 DIAGNOSIS — D500 Iron deficiency anemia secondary to blood loss (chronic): Secondary | ICD-10-CM | POA: Diagnosis not present

## 2022-03-05 DIAGNOSIS — Z79624 Long term (current) use of inhibitors of nucleotide synthesis: Secondary | ICD-10-CM | POA: Insufficient documentation

## 2022-03-05 DIAGNOSIS — Z841 Family history of disorders of kidney and ureter: Secondary | ICD-10-CM | POA: Insufficient documentation

## 2022-03-05 DIAGNOSIS — F32A Depression, unspecified: Secondary | ICD-10-CM | POA: Insufficient documentation

## 2022-03-05 DIAGNOSIS — Z803 Family history of malignant neoplasm of breast: Secondary | ICD-10-CM | POA: Insufficient documentation

## 2022-03-05 DIAGNOSIS — Z8249 Family history of ischemic heart disease and other diseases of the circulatory system: Secondary | ICD-10-CM | POA: Insufficient documentation

## 2022-03-05 DIAGNOSIS — Z808 Family history of malignant neoplasm of other organs or systems: Secondary | ICD-10-CM | POA: Insufficient documentation

## 2022-03-05 DIAGNOSIS — C9001 Multiple myeloma in remission: Secondary | ICD-10-CM | POA: Insufficient documentation

## 2022-03-05 DIAGNOSIS — Z822 Family history of deafness and hearing loss: Secondary | ICD-10-CM | POA: Insufficient documentation

## 2022-03-05 DIAGNOSIS — Z888 Allergy status to other drugs, medicaments and biological substances status: Secondary | ICD-10-CM | POA: Insufficient documentation

## 2022-03-05 DIAGNOSIS — E1122 Type 2 diabetes mellitus with diabetic chronic kidney disease: Secondary | ICD-10-CM | POA: Insufficient documentation

## 2022-03-05 DIAGNOSIS — R110 Nausea: Secondary | ICD-10-CM | POA: Insufficient documentation

## 2022-03-05 DIAGNOSIS — Z832 Family history of diseases of the blood and blood-forming organs and certain disorders involving the immune mechanism: Secondary | ICD-10-CM | POA: Insufficient documentation

## 2022-03-05 DIAGNOSIS — Z833 Family history of diabetes mellitus: Secondary | ICD-10-CM | POA: Diagnosis not present

## 2022-03-05 DIAGNOSIS — Z9481 Bone marrow transplant status: Secondary | ICD-10-CM | POA: Insufficient documentation

## 2022-03-05 DIAGNOSIS — Z79899 Other long term (current) drug therapy: Secondary | ICD-10-CM | POA: Insufficient documentation

## 2022-03-05 DIAGNOSIS — I129 Hypertensive chronic kidney disease with stage 1 through stage 4 chronic kidney disease, or unspecified chronic kidney disease: Secondary | ICD-10-CM | POA: Diagnosis not present

## 2022-03-05 DIAGNOSIS — Z885 Allergy status to narcotic agent status: Secondary | ICD-10-CM | POA: Diagnosis not present

## 2022-03-05 DIAGNOSIS — Z801 Family history of malignant neoplasm of trachea, bronchus and lung: Secondary | ICD-10-CM | POA: Insufficient documentation

## 2022-03-05 DIAGNOSIS — Z800 Family history of malignant neoplasm of digestive organs: Secondary | ICD-10-CM | POA: Insufficient documentation

## 2022-03-05 DIAGNOSIS — Z8379 Family history of other diseases of the digestive system: Secondary | ICD-10-CM | POA: Insufficient documentation

## 2022-03-05 LAB — MAGNESIUM: Magnesium: 1 mg/dL — ABNORMAL LOW (ref 1.7–2.4)

## 2022-03-05 LAB — BASIC METABOLIC PANEL
Anion gap: 13 (ref 5–15)
BUN: 36 mg/dL — ABNORMAL HIGH (ref 8–23)
CO2: 25 mmol/L (ref 22–32)
Calcium: 8.9 mg/dL (ref 8.9–10.3)
Chloride: 97 mmol/L — ABNORMAL LOW (ref 98–111)
Creatinine, Ser: 1.5 mg/dL — ABNORMAL HIGH (ref 0.44–1.00)
GFR, Estimated: 38 mL/min — ABNORMAL LOW (ref >60)
Glucose, Bld: 199 mg/dL — ABNORMAL HIGH (ref 70–99)
Potassium: 3.8 mmol/L (ref 3.5–5.1)
Sodium: 135 mmol/L (ref 135–145)

## 2022-03-05 MED ORDER — HEPARIN SOD (PORK) LOCK FLUSH 100 UNIT/ML IV SOLN
500.0000 [IU] | Freq: Once | INTRAVENOUS | Status: AC | PRN
  Administered 2022-03-05: 500 [IU]
  Filled 2022-03-05: qty 5

## 2022-03-05 MED ORDER — SODIUM CHLORIDE 0.9 % IV SOLN
25.0000 mg | Freq: Once | INTRAVENOUS | Status: AC
  Administered 2022-03-05: 25 mg via INTRAVENOUS
  Filled 2022-03-05: qty 1

## 2022-03-05 MED ORDER — SODIUM CHLORIDE 0.9% FLUSH
10.0000 mL | Freq: Once | INTRAVENOUS | Status: AC | PRN
  Administered 2022-03-05: 10 mL
  Filled 2022-03-05: qty 10

## 2022-03-05 MED ORDER — MAGNESIUM SULFATE 2 GM/50ML IV SOLN
2.0000 g | Freq: Once | INTRAVENOUS | Status: AC
  Administered 2022-03-05: 2 g via INTRAVENOUS
  Filled 2022-03-05: qty 50

## 2022-03-05 MED ORDER — MAGNESIUM SULFATE 4 GM/100ML IV SOLN
4.0000 g | Freq: Once | INTRAVENOUS | Status: AC
  Administered 2022-03-05: 4 g via INTRAVENOUS
  Filled 2022-03-05: qty 100

## 2022-03-05 MED ORDER — SODIUM CHLORIDE 0.9 % IV SOLN
Freq: Once | INTRAVENOUS | Status: AC
  Filled 2022-03-05: qty 250

## 2022-03-05 MED ORDER — SODIUM CHLORIDE 0.9 % IV SOLN: 6.0000 g | Freq: Once | INTRAVENOUS | Status: DC

## 2022-03-05 NOTE — Telephone Encounter (Signed)
APPROVED until 06/30/2022.

## 2022-03-05 NOTE — Patient Instructions (Signed)
Magnesium Sulfate Injection What is this medication? MAGNESIUM SULFATE (mag NEE zee um SUL fate) prevents and treats low levels of magnesium in your body. It may also be used to prevent and treat seizures during pregnancy in people with high blood pressure disorders, such as preeclampsia or eclampsia. Magnesium plays an important role in maintaining the health of your muscles and nervous system. This medicine may be used for other purposes; ask your health care provider or pharmacist if you have questions. What should I tell my care team before I take this medication? They need to know if you have any of these conditions: Heart disease History of irregular heart beat Kidney disease An unusual or allergic reaction to magnesium sulfate, medications, foods, dyes, or preservatives Pregnant or trying to get pregnant Breast-feeding How should I use this medication? This medication is for infusion into a vein. It is given in a hospital or clinic setting. Talk to your care team about the use of this medication in children. While this medication may be prescribed for selected conditions, precautions do apply. Overdosage: If you think you have taken too much of this medicine contact a poison control center or emergency room at once. NOTE: This medicine is only for you. Do not share this medicine with others. What if I miss a dose? This does not apply. What may interact with this medication? Certain medications for anxiety or sleep Certain medications for seizures like phenobarbital Digoxin Medications that relax muscles for surgery Narcotic medications for pain This list may not describe all possible interactions. Give your health care provider a list of all the medicines, herbs, non-prescription drugs, or dietary supplements you use. Also tell them if you smoke, drink alcohol, or use illegal drugs. Some items may interact with your medicine. What should I watch for while using this medication? Your  condition will be monitored carefully while you are receiving this medication. You may need blood work done while you are receiving this medication. What side effects may I notice from receiving this medication? Side effects that you should report to your care team as soon as possible: Allergic reactions--skin rash, itching, hives, swelling of the face, lips, tongue, or throat High magnesium level--confusion, drowsiness, facial flushing, redness, sweating, muscle weakness, fast or irregular heartbeat, trouble breathing Low blood pressure--dizziness, feeling faint or lightheaded, blurry vision Side effects that usually do not require medical attention (report to your care team if they continue or are bothersome): Headache Nausea This list may not describe all possible side effects. Call your doctor for medical advice about side effects. You may report side effects to FDA at 1-800-FDA-1088. Where should I keep my medication? This medication is given in a hospital or clinic and will not be stored at home. NOTE: This sheet is a summary. It may not cover all possible information. If you have questions about this medicine, talk to your doctor, pharmacist, or health care provider.  2023 Elsevier/Gold Standard (2020-08-24 00:00:00)  

## 2022-03-05 NOTE — Telephone Encounter (Signed)
Completed PA on [email or web address] for Duloxetine 60 mg.   KeyMargaret [name] - PA Case ID: [id number]  Awaiting outcome.

## 2022-03-05 NOTE — Telephone Encounter (Signed)
Completed PA. Waiting of outcome. Will document in patient chart with telephone call.  - [place]

## 2022-03-06 LAB — CELIAC PANEL 10
Antigliadin Abs, IgA: 1 units (ref 0–19)
Endomysial Ab, IgA: NEGATIVE
Gliadin IgG: 1 units (ref 0–19)
IgA: 23 mg/dL — ABNORMAL LOW (ref 87–352)
Tissue Transglut Ab: <2 U/mL (ref 0–5)
Tissue Transglutaminase Ab, IgA: <2 U/mL (ref 0–3)

## 2022-03-07 ENCOUNTER — Other Ambulatory Visit: Payer: Self-pay

## 2022-03-10 ENCOUNTER — Other Ambulatory Visit: Payer: Self-pay | Admitting: Oncology

## 2022-03-10 NOTE — Progress Notes (Signed)
ON PATHWAY REGIMEN - Multiple Myeloma and Other Plasma Cell Dyscrasias  No Change  Continue With Treatment as Ordered.  Original Decision Date/Time: 05/14/2021 22:19     A cycle is every 28 days:     Lenalidomide      Daratumumab and hyaluronidase-fihj   **Always confirm dose/schedule in your pharmacy ordering system**  Patient Characteristics: Multiple Myeloma, Post-Transplant Maintenance Therapy, High Risk Disease Classification: Multiple Myeloma R-ISS Staging: III Therapeutic Status: Post-transplant Maintenance Therapy Risk Status: High Risk Intent of Therapy: Non-Curative / Palliative Intent, Discussed with Patient

## 2022-03-12 ENCOUNTER — Inpatient Hospital Stay: Payer: Medicare HMO

## 2022-03-12 VITALS — BP 127/73 | HR 89 | Temp 97.9°F

## 2022-03-12 DIAGNOSIS — E538 Deficiency of other specified B group vitamins: Secondary | ICD-10-CM | POA: Diagnosis not present

## 2022-03-12 DIAGNOSIS — I350 Nonrheumatic aortic (valve) stenosis: Secondary | ICD-10-CM | POA: Diagnosis not present

## 2022-03-12 DIAGNOSIS — D500 Iron deficiency anemia secondary to blood loss (chronic): Secondary | ICD-10-CM | POA: Diagnosis not present

## 2022-03-12 DIAGNOSIS — R110 Nausea: Secondary | ICD-10-CM | POA: Diagnosis not present

## 2022-03-12 DIAGNOSIS — C9001 Multiple myeloma in remission: Secondary | ICD-10-CM | POA: Diagnosis not present

## 2022-03-12 DIAGNOSIS — Z79899 Other long term (current) drug therapy: Secondary | ICD-10-CM | POA: Diagnosis not present

## 2022-03-12 DIAGNOSIS — Z95828 Presence of other vascular implants and grafts: Secondary | ICD-10-CM

## 2022-03-12 DIAGNOSIS — C9000 Multiple myeloma not having achieved remission: Secondary | ICD-10-CM

## 2022-03-12 DIAGNOSIS — G620 Drug-induced polyneuropathy: Secondary | ICD-10-CM | POA: Diagnosis not present

## 2022-03-12 DIAGNOSIS — T451X5A Adverse effect of antineoplastic and immunosuppressive drugs, initial encounter: Secondary | ICD-10-CM | POA: Diagnosis not present

## 2022-03-12 LAB — BASIC METABOLIC PANEL
Anion gap: 8 (ref 5–15)
BUN: 24 mg/dL — ABNORMAL HIGH (ref 8–23)
CO2: 26 mmol/L (ref 22–32)
Calcium: 8.4 mg/dL — ABNORMAL LOW (ref 8.9–10.3)
Chloride: 101 mmol/L (ref 98–111)
Creatinine, Ser: 1.29 mg/dL — ABNORMAL HIGH (ref 0.44–1.00)
GFR, Estimated: 46 mL/min — ABNORMAL LOW (ref >60)
Glucose, Bld: 166 mg/dL — ABNORMAL HIGH (ref 70–99)
Potassium: 3.6 mmol/L (ref 3.5–5.1)
Sodium: 135 mmol/L (ref 135–145)

## 2022-03-12 LAB — MAGNESIUM: Magnesium: 1 mg/dL — ABNORMAL LOW (ref 1.7–2.4)

## 2022-03-12 MED ORDER — MAGNESIUM SULFATE 2 GM/50ML IV SOLN
2.0000 g | Freq: Once | INTRAVENOUS | Status: AC
  Administered 2022-03-12: 2 g via INTRAVENOUS
  Filled 2022-03-12: qty 50

## 2022-03-12 MED ORDER — SODIUM CHLORIDE 0.9% FLUSH
10.0000 mL | Freq: Once | INTRAVENOUS | Status: AC
  Administered 2022-03-12: 10 mL via INTRAVENOUS
  Filled 2022-03-12: qty 10

## 2022-03-12 MED ORDER — SODIUM CHLORIDE 0.9 % IV SOLN
Freq: Once | INTRAVENOUS | Status: AC
  Filled 2022-03-12: qty 250

## 2022-03-12 MED ORDER — SODIUM CHLORIDE 0.9 % IV SOLN: 6.0000 g | Freq: Once | INTRAVENOUS | Status: DC

## 2022-03-12 MED ORDER — HEPARIN SOD (PORK) LOCK FLUSH 100 UNIT/ML IV SOLN
500.0000 [IU] | Freq: Once | INTRAVENOUS | Status: AC
  Administered 2022-03-12: 500 [IU]
  Filled 2022-03-12: qty 5

## 2022-03-12 MED ORDER — SODIUM CHLORIDE 0.9 % IV SOLN
25.0000 mg | Freq: Once | INTRAVENOUS | Status: AC
  Administered 2022-03-12: 25 mg via INTRAVENOUS
  Filled 2022-03-12: qty 1

## 2022-03-12 MED ORDER — MAGNESIUM SULFATE 4 GM/100ML IV SOLN
4.0000 g | Freq: Once | INTRAVENOUS | Status: AC
  Administered 2022-03-12: 4 g via INTRAVENOUS
  Filled 2022-03-12: qty 100

## 2022-03-13 DIAGNOSIS — I350 Nonrheumatic aortic (valve) stenosis: Secondary | ICD-10-CM | POA: Diagnosis not present

## 2022-03-13 DIAGNOSIS — Q231 Congenital insufficiency of aortic valve: Secondary | ICD-10-CM | POA: Diagnosis not present

## 2022-03-13 DIAGNOSIS — Z79899 Other long term (current) drug therapy: Secondary | ICD-10-CM | POA: Diagnosis not present

## 2022-03-13 DIAGNOSIS — Z885 Allergy status to narcotic agent status: Secondary | ICD-10-CM | POA: Diagnosis not present

## 2022-03-13 DIAGNOSIS — G894 Chronic pain syndrome: Secondary | ICD-10-CM | POA: Diagnosis not present

## 2022-03-13 DIAGNOSIS — Z87891 Personal history of nicotine dependence: Secondary | ICD-10-CM | POA: Diagnosis not present

## 2022-03-13 DIAGNOSIS — I471 Supraventricular tachycardia: Secondary | ICD-10-CM | POA: Diagnosis not present

## 2022-03-13 DIAGNOSIS — I493 Ventricular premature depolarization: Secondary | ICD-10-CM | POA: Diagnosis not present

## 2022-03-13 DIAGNOSIS — I5042 Chronic combined systolic (congestive) and diastolic (congestive) heart failure: Secondary | ICD-10-CM | POA: Diagnosis not present

## 2022-03-13 DIAGNOSIS — C9002 Multiple myeloma in relapse: Secondary | ICD-10-CM | POA: Diagnosis not present

## 2022-03-14 ENCOUNTER — Other Ambulatory Visit: Payer: Self-pay | Admitting: Internal Medicine

## 2022-03-14 DIAGNOSIS — F324 Major depressive disorder, single episode, in partial remission: Secondary | ICD-10-CM

## 2022-03-15 NOTE — Telephone Encounter (Signed)
Requested Prescriptions  Pending Prescriptions Disp Refills  . DULoxetine (CYMBALTA) 60 MG capsule [Pharmacy Med Name: DULOXETINE DR 60MG CAPSULES] 90 capsule 0    Sig: TAKE 1 CAPSULE(60 MG) BY MOUTH DAILY     Psychiatry: Antidepressants - SNRI - duloxetine Failed - 03/14/2022  4:34 PM      Failed - Cr in normal range and within 360 days    Creatinine, Ser  Date Value Ref Range Status  03/12/2022 1.29 (H) 0.44 - 1.00 mg/dL Final         Passed - eGFR is 30 or above and within 360 days    GFR calc Af Amer  Date Value Ref Range Status  03/27/2020 >60 >60 mL/min Final   GFR, Estimated  Date Value Ref Range Status  03/12/2022 46 (L) >60 mL/min Final    Comment:    (NOTE) Calculated using the CKD-EPI Creatinine Equation (2021)          Passed - Completed PHQ-2 or PHQ-9 in the last 360 days      Passed - Last BP in normal range    BP Readings from Last 1 Encounters:  03/12/22 127/73         Passed - Valid encounter within last 6 months    Recent Outpatient Visits          2 months ago Bacterial URI   [place] [facility] [place], Earley Abide, MD   3 months ago Annual physical exam   [place] [facility], Jesse Sans, MD   8 months ago Viral URI with cough    [facility] at [facility], Jesse Sans, MD   9 months ago Exposure to influenza   [facility] at [facility], Jesse Sans, MD   1 year ago Pneumonia of right lower lobe due to infectious organism   [facility] at [facility], Jesse Sans, MD      Future Appointments            In 8 months [facility], Jesse Sans, MD [place] [facility] at [facility]

## 2022-03-19 ENCOUNTER — Inpatient Hospital Stay: Payer: Medicare HMO

## 2022-03-19 VITALS — BP 125/68 | HR 80 | Temp 97.8°F

## 2022-03-19 DIAGNOSIS — R110 Nausea: Secondary | ICD-10-CM | POA: Diagnosis not present

## 2022-03-19 DIAGNOSIS — D500 Iron deficiency anemia secondary to blood loss (chronic): Secondary | ICD-10-CM | POA: Diagnosis not present

## 2022-03-19 DIAGNOSIS — E538 Deficiency of other specified B group vitamins: Secondary | ICD-10-CM | POA: Diagnosis not present

## 2022-03-19 DIAGNOSIS — Z79899 Other long term (current) drug therapy: Secondary | ICD-10-CM | POA: Diagnosis not present

## 2022-03-19 DIAGNOSIS — C9000 Multiple myeloma not having achieved remission: Secondary | ICD-10-CM

## 2022-03-19 DIAGNOSIS — I350 Nonrheumatic aortic (valve) stenosis: Secondary | ICD-10-CM | POA: Diagnosis not present

## 2022-03-19 DIAGNOSIS — T451X5A Adverse effect of antineoplastic and immunosuppressive drugs, initial encounter: Secondary | ICD-10-CM | POA: Diagnosis not present

## 2022-03-19 DIAGNOSIS — C9001 Multiple myeloma in remission: Secondary | ICD-10-CM | POA: Diagnosis not present

## 2022-03-19 DIAGNOSIS — G620 Drug-induced polyneuropathy: Secondary | ICD-10-CM | POA: Diagnosis not present

## 2022-03-19 LAB — BASIC METABOLIC PANEL
Anion gap: 9 (ref 5–15)
BUN: 28 mg/dL — ABNORMAL HIGH (ref 8–23)
CO2: 25 mmol/L (ref 22–32)
Calcium: 9 mg/dL (ref 8.9–10.3)
Chloride: 104 mmol/L (ref 98–111)
Creatinine, Ser: 1.33 mg/dL — ABNORMAL HIGH (ref 0.44–1.00)
GFR, Estimated: 44 mL/min — ABNORMAL LOW (ref >60)
Glucose, Bld: 168 mg/dL — ABNORMAL HIGH (ref 70–99)
Potassium: 3.9 mmol/L (ref 3.5–5.1)
Sodium: 138 mmol/L (ref 135–145)

## 2022-03-19 LAB — MAGNESIUM: Magnesium: 1 mg/dL — ABNORMAL LOW (ref 1.7–2.4)

## 2022-03-19 MED ORDER — MAGNESIUM SULFATE 4 GM/100ML IV SOLN
4.0000 g | Freq: Once | INTRAVENOUS | Status: AC
  Administered 2022-03-19: 4 g via INTRAVENOUS
  Filled 2022-03-19: qty 100

## 2022-03-19 MED ORDER — SODIUM CHLORIDE 0.9 % IV SOLN: 4.0000 g | Freq: Once | INTRAVENOUS | Status: DC

## 2022-03-19 MED ORDER — HEPARIN SOD (PORK) LOCK FLUSH 100 UNIT/ML IV SOLN
500.0000 [IU] | Freq: Once | INTRAVENOUS | Status: AC | PRN
  Administered 2022-03-19: 500 [IU]
  Filled 2022-03-19: qty 5

## 2022-03-19 MED ORDER — SODIUM CHLORIDE 0.9% FLUSH
10.0000 mL | Freq: Once | INTRAVENOUS | Status: AC | PRN
  Administered 2022-03-19: 10 mL
  Filled 2022-03-19: qty 10

## 2022-03-19 MED ORDER — MAGNESIUM SULFATE 2 GM/50ML IV SOLN
2.0000 g | Freq: Once | INTRAVENOUS | Status: AC
  Administered 2022-03-19: 2 g via INTRAVENOUS
  Filled 2022-03-19: qty 50

## 2022-03-19 MED ORDER — SODIUM CHLORIDE 0.9 % IV SOLN
25.0000 mg | Freq: Once | INTRAVENOUS | Status: DC
  Administered 2022-03-19: 25 mg via INTRAVENOUS
  Filled 2022-03-19: qty 1

## 2022-03-25 ENCOUNTER — Emergency Department: Payer: Medicare HMO

## 2022-03-25 ENCOUNTER — Telehealth: Payer: Self-pay

## 2022-03-25 ENCOUNTER — Inpatient Hospital Stay (HOSPITAL_BASED_OUTPATIENT_CLINIC_OR_DEPARTMENT_OTHER): Payer: Medicare HMO | Admitting: Oncology

## 2022-03-25 ENCOUNTER — Other Ambulatory Visit: Payer: Self-pay

## 2022-03-25 ENCOUNTER — Inpatient Hospital Stay: Payer: Medicare HMO

## 2022-03-25 ENCOUNTER — Encounter: Payer: Self-pay | Admitting: Oncology

## 2022-03-25 VITALS — BP 130/66 | HR 96 | Temp 97.1°F | Resp 18 | Wt 156.4 lb

## 2022-03-25 DIAGNOSIS — N1831 Chronic kidney disease, stage 3a: Secondary | ICD-10-CM | POA: Diagnosis not present

## 2022-03-25 DIAGNOSIS — R0602 Shortness of breath: Secondary | ICD-10-CM | POA: Insufficient documentation

## 2022-03-25 DIAGNOSIS — R110 Nausea: Secondary | ICD-10-CM | POA: Diagnosis not present

## 2022-03-25 DIAGNOSIS — G620 Drug-induced polyneuropathy: Secondary | ICD-10-CM | POA: Diagnosis not present

## 2022-03-25 DIAGNOSIS — D649 Anemia, unspecified: Secondary | ICD-10-CM

## 2022-03-25 DIAGNOSIS — E538 Deficiency of other specified B group vitamins: Secondary | ICD-10-CM

## 2022-03-25 DIAGNOSIS — I2510 Atherosclerotic heart disease of native coronary artery without angina pectoris: Secondary | ICD-10-CM | POA: Insufficient documentation

## 2022-03-25 DIAGNOSIS — R771 Abnormality of globulin: Secondary | ICD-10-CM | POA: Diagnosis not present

## 2022-03-25 DIAGNOSIS — I130 Hypertensive heart and chronic kidney disease with heart failure and stage 1 through stage 4 chronic kidney disease, or unspecified chronic kidney disease: Secondary | ICD-10-CM | POA: Diagnosis not present

## 2022-03-25 DIAGNOSIS — J811 Chronic pulmonary edema: Secondary | ICD-10-CM | POA: Diagnosis not present

## 2022-03-25 DIAGNOSIS — E1122 Type 2 diabetes mellitus with diabetic chronic kidney disease: Secondary | ICD-10-CM | POA: Insufficient documentation

## 2022-03-25 DIAGNOSIS — R197 Diarrhea, unspecified: Secondary | ICD-10-CM

## 2022-03-25 DIAGNOSIS — I509 Heart failure, unspecified: Secondary | ICD-10-CM | POA: Insufficient documentation

## 2022-03-25 DIAGNOSIS — T451X5A Adverse effect of antineoplastic and immunosuppressive drugs, initial encounter: Secondary | ICD-10-CM | POA: Diagnosis not present

## 2022-03-25 DIAGNOSIS — R000 Tachycardia, unspecified: Secondary | ICD-10-CM | POA: Diagnosis not present

## 2022-03-25 DIAGNOSIS — C9000 Multiple myeloma not having achieved remission: Secondary | ICD-10-CM

## 2022-03-25 DIAGNOSIS — C9001 Multiple myeloma in remission: Secondary | ICD-10-CM | POA: Diagnosis not present

## 2022-03-25 DIAGNOSIS — Z7682 Awaiting organ transplant status: Secondary | ICD-10-CM | POA: Diagnosis not present

## 2022-03-25 DIAGNOSIS — G629 Polyneuropathy, unspecified: Secondary | ICD-10-CM

## 2022-03-25 DIAGNOSIS — R062 Wheezing: Secondary | ICD-10-CM | POA: Diagnosis not present

## 2022-03-25 DIAGNOSIS — I493 Ventricular premature depolarization: Secondary | ICD-10-CM | POA: Diagnosis not present

## 2022-03-25 DIAGNOSIS — I959 Hypotension, unspecified: Secondary | ICD-10-CM | POA: Diagnosis not present

## 2022-03-25 DIAGNOSIS — Z79899 Other long term (current) drug therapy: Secondary | ICD-10-CM | POA: Diagnosis not present

## 2022-03-25 DIAGNOSIS — D500 Iron deficiency anemia secondary to blood loss (chronic): Secondary | ICD-10-CM | POA: Diagnosis not present

## 2022-03-25 DIAGNOSIS — I350 Nonrheumatic aortic (valve) stenosis: Secondary | ICD-10-CM | POA: Diagnosis not present

## 2022-03-25 DIAGNOSIS — R0600 Dyspnea, unspecified: Secondary | ICD-10-CM | POA: Diagnosis not present

## 2022-03-25 LAB — COMPREHENSIVE METABOLIC PANEL
ALT: 32 U/L (ref 0–44)
ALT: 32 U/L (ref 0–44)
AST: 38 U/L (ref 15–41)
AST: 39 U/L (ref 15–41)
Albumin: 3.7 g/dL (ref 3.5–5.0)
Albumin: 3.7 g/dL (ref 3.5–5.0)
Alkaline Phosphatase: 105 U/L (ref 38–126)
Alkaline Phosphatase: 106 U/L (ref 38–126)
Anion gap: 5 (ref 5–15)
Anion gap: 5 (ref 5–15)
BUN: 22 mg/dL (ref 8–23)
BUN: 18 mg/dL (ref 8–23)
CO2: 23 mmol/L (ref 22–32)
CO2: 22 mmol/L (ref 22–32)
Calcium: 8.2 mg/dL — ABNORMAL LOW (ref 8.9–10.3)
Calcium: 8.3 mg/dL — ABNORMAL LOW (ref 8.9–10.3)
Chloride: 110 mmol/L (ref 98–111)
Chloride: 110 mmol/L (ref 98–111)
Creatinine, Ser: 1.1 mg/dL — ABNORMAL HIGH (ref 0.44–1.00)
Creatinine, Ser: 1.04 mg/dL — ABNORMAL HIGH (ref 0.44–1.00)
GFR, Estimated: 56 mL/min — ABNORMAL LOW (ref >60)
GFR, Estimated: 60 mL/min — ABNORMAL LOW (ref >60)
Glucose, Bld: 163 mg/dL — ABNORMAL HIGH (ref 70–99)
Glucose, Bld: 177 mg/dL — ABNORMAL HIGH (ref 70–99)
Potassium: 3.9 mmol/L (ref 3.5–5.1)
Potassium: 4.2 mmol/L (ref 3.5–5.1)
Sodium: 138 mmol/L (ref 135–145)
Sodium: 137 mmol/L (ref 135–145)
Total Bilirubin: 0.2 mg/dL — ABNORMAL LOW (ref 0.3–1.2)
Total Bilirubin: 0.5 mg/dL (ref 0.3–1.2)
Total Protein: 6.5 g/dL (ref 6.5–8.1)
Total Protein: 6.6 g/dL (ref 6.5–8.1)

## 2022-03-25 LAB — CBC WITH DIFFERENTIAL/PLATELET
Abs Immature Granulocytes: 0.01 10*3/uL (ref 0.00–0.07)
Abs Immature Granulocytes: 0.03 10*3/uL (ref 0.00–0.07)
Basophils Absolute: 0 10*3/uL (ref 0.0–0.1)
Basophils Absolute: 0 10*3/uL (ref 0.0–0.1)
Basophils Relative: 1 %
Basophils Relative: 0 %
Eosinophils Absolute: 0.1 10*3/uL (ref 0.0–0.5)
Eosinophils Absolute: 0.2 10*3/uL (ref 0.0–0.5)
Eosinophils Relative: 3 %
Eosinophils Relative: 2 %
HCT: 30.6 % — ABNORMAL LOW (ref 36.0–46.0)
HCT: 32.9 % — ABNORMAL LOW (ref 36.0–46.0)
Hemoglobin: 9.7 g/dL — ABNORMAL LOW (ref 12.0–15.0)
Hemoglobin: 10.3 g/dL — ABNORMAL LOW (ref 12.0–15.0)
Immature Granulocytes: 0 %
Immature Granulocytes: 0 %
Lymphocytes Relative: 22 %
Lymphocytes Relative: 21 %
Lymphs Abs: 1.1 10*3/uL (ref 0.7–4.0)
Lymphs Abs: 1.5 10*3/uL (ref 0.7–4.0)
MCH: 24.7 pg — ABNORMAL LOW (ref 26.0–34.0)
MCH: 24.5 pg — ABNORMAL LOW (ref 26.0–34.0)
MCHC: 31.7 g/dL (ref 30.0–36.0)
MCHC: 31.3 g/dL (ref 30.0–36.0)
MCV: 78.1 fL — ABNORMAL LOW (ref 80.0–100.0)
MCV: 78.3 fL — ABNORMAL LOW (ref 80.0–100.0)
Monocytes Absolute: 0.4 10*3/uL (ref 0.1–1.0)
Monocytes Absolute: 0.4 10*3/uL (ref 0.1–1.0)
Monocytes Relative: 7 %
Monocytes Relative: 6 %
Neutro Abs: 3.4 10*3/uL (ref 1.7–7.7)
Neutro Abs: 4.9 10*3/uL (ref 1.7–7.7)
Neutrophils Relative %: 67 %
Neutrophils Relative %: 71 %
Platelets: 116 10*3/uL — ABNORMAL LOW (ref 150–400)
Platelets: 145 10*3/uL — ABNORMAL LOW (ref 150–400)
RBC: 3.92 MIL/uL (ref 3.87–5.11)
RBC: 4.2 MIL/uL (ref 3.87–5.11)
RDW: 15.8 % — ABNORMAL HIGH (ref 11.5–15.5)
RDW: 15.8 % — ABNORMAL HIGH (ref 11.5–15.5)
WBC: 5.1 10*3/uL (ref 4.0–10.5)
WBC: 7.1 10*3/uL (ref 4.0–10.5)
nRBC: 0 % (ref 0.0–0.2)
nRBC: 0 % (ref 0.0–0.2)

## 2022-03-25 LAB — IRON AND TIBC
Iron: 32 ug/dL (ref 28–170)
Saturation Ratios: 8 % — ABNORMAL LOW (ref 10.4–31.8)
TIBC: 402 ug/dL (ref 250–450)
UIBC: 370 ug/dL

## 2022-03-25 LAB — VITAMIN B12: Vitamin B-12: 513 pg/mL (ref 180–914)

## 2022-03-25 LAB — FERRITIN: Ferritin: 10 ng/mL — ABNORMAL LOW (ref 11–307)

## 2022-03-25 LAB — BRAIN NATRIURETIC PEPTIDE: B Natriuretic Peptide: 675.9 pg/mL — ABNORMAL HIGH (ref 0.0–100.0)

## 2022-03-25 LAB — TROPONIN I (HIGH SENSITIVITY): Troponin I (High Sensitivity): 16 ng/L (ref <18)

## 2022-03-25 LAB — MAGNESIUM: Magnesium: 1.2 mg/dL — ABNORMAL LOW (ref 1.7–2.4)

## 2022-03-25 MED ORDER — MAGNESIUM SULFATE 4 GM/100ML IV SOLN
4.0000 g | Freq: Once | INTRAVENOUS | Status: AC
  Administered 2022-03-25: 4 g via INTRAVENOUS
  Filled 2022-03-25: qty 100

## 2022-03-25 MED ORDER — SODIUM CHLORIDE 0.9 % IV SOLN
Freq: Once | INTRAVENOUS | Status: AC
  Filled 2022-03-25: qty 250

## 2022-03-25 MED ORDER — SODIUM CHLORIDE 0.9% FLUSH
10.0000 mL | Freq: Once | INTRAVENOUS | Status: AC | PRN
  Administered 2022-03-25: 10 mL
  Filled 2022-03-25: qty 10

## 2022-03-25 MED ORDER — MAGNESIUM SULFATE 2 GM/50ML IV SOLN
2.0000 g | Freq: Once | INTRAVENOUS | Status: AC
  Administered 2022-03-25: 2 g via INTRAVENOUS
  Filled 2022-03-25: qty 50

## 2022-03-25 MED ORDER — SODIUM CHLORIDE 0.9 % IV SOLN: 6.0000 g | Freq: Once | INTRAVENOUS | Status: DC

## 2022-03-25 MED ORDER — CYANOCOBALAMIN 1000 MCG/ML IJ SOLN
1000.0000 ug | Freq: Once | INTRAMUSCULAR | Status: AC
  Administered 2022-03-25: 1000 ug via INTRAMUSCULAR
  Filled 2022-03-25: qty 1

## 2022-03-25 MED ORDER — HEPARIN SOD (PORK) LOCK FLUSH 100 UNIT/ML IV SOLN
500.0000 [IU] | Freq: Once | INTRAVENOUS | Status: AC | PRN
  Administered 2022-03-25: 500 [IU]
  Filled 2022-03-25: qty 5

## 2022-03-25 MED ORDER — SODIUM CHLORIDE 0.9 % IV SOLN
25.0000 mg | Freq: Once | INTRAVENOUS | Status: AC
  Administered 2022-03-25: 25 mg via INTRAVENOUS
  Filled 2022-03-25: qty 1

## 2022-03-25 NOTE — Progress Notes (Signed)
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  Light chain ratio is normal. 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 Repeat immunofixation negative. . Labs reviewed and discussed with patient, stable M protein and light chain ratio.  Daratumumab on hold due to frequent infections despite IVIG Continue acyclovir prophylaxis.    Stem cell transplant candidate -[facility]- Post transplant RN coordinator [phone]).  Hypomagnesemia #Chronic hypomagnesemia proceed with  IV magnesium today.  Continue weekly magnesium +/- IV magnesium.   Continue Slow-Mag 1 tab every other day.   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   Previously on Lyrica and nortriptyline, she declined due to didn't notice much improvement   Neurology recommend Tramadol and may consider higher dose of  Nortriptyline in the future. Follows up with neurology.  Diarrhea chronic diarrhea,antidiarrhea PRN.  Hypoglobulinemia PRN IVIG infusion if IgG level < 500.  B12 deficiency Continue B12 monthly injection.   Iron deficiency anemia due to chronic blood loss Recommend IV venofer weekly x4 She previously tolerated   Orders Placed This Encounter  Procedures   Ferritin    Standing Status:   Future    Number of Occurrences:   1    Standing Expiration Date:   03/26/2023   Iron and TIBC    Standing Status:   Future    Number of Occurrences:   1    Standing Expiration Date:   03/26/2023   Follow up  IV mag today Lab Mag weekly x3  + IV mag.  4 weeks flex lab MD IV mag + B12 inj-same  labs, All questions were answered. The patient knows to call the clinic with any problems, questions or concerns.  Earlie Server, MD, PhD [facility] Hematology Oncology 03/25/2022    Chief Complaint: Sarah [name] is a 66 y.o. 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) who is seen for maintenance chemotherapy   PERTINENT ONCOLOGY HISTORY Sarah [name] is a 66 y.o.afemale who has above oncology history reviewed by me today presented for follow up visit for management of multiple myeloma Patient previously followed up by Dr.Corcoran, patient switched care to me on 11/23/20 Extensive medical record review was performed by me  stage III IgA lambda light chain multiple myeloma s/p autologous stem cell transplant on 06/14/2015 at the [place] and second autologous stem cell transplant on 05/10/2020 at [facility].   Initial bone marrow revealed 80% plasma cells.  Lambda free light chains were 1340.  She had nephrotic range proteinuria.  She initially underwent induction with RVD.  Revlimid maintenance was discontinued on 01/21/2017 secondary to intolerance.     07/19/2019 Bone marrow aspirate and biopsy on  revealed a normocellular marrow with but increased lambda-restricted plasma cells (9% aspirate, 40% CD138 immunohistochemistry).  Findings were consistent with recurrent plasma cell myeloma.  Flow cytometry revealed no monoclonal B-cell or phenotypically aberrant T-cell population. Cytogenetics were 55, XX (normal).  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    Lambda light chains have been followed: 22.2 (ratio 0.56) on 07/03/2017, 30.8 (ratio 0.78) on 09/02/2017, 36.9 (ratio 0.40) on 10/21/2017, 37.4 (  ratio 0.41) on 12/16/2017, 70.7(ratio 0.31)  on 02/17/2018, 64.2 (ratio 0.27) on 04/07/2018, 78.9 (ratio 0.18) on 05/26/2018, 128.8 (ratio 0.17) on 08/06/2018, 181.5 (ratio 0.13) on 10/08/2018, 130.9 (ratio 0.13) on  10/20/2018, 160.7 (ratio 0.10) on 12/09/2018, 236.6 (ratio 0.07) on 02/01/2019, 363.6 (ratio 0.04) on 03/22/2019, 404.8 (ratio 0.04) on 04/05/2019, 420.7 (ratio 0.03) on 05/24/2019, 573.4 (ratio 0.03) on 06/23/2019, 451.05 (ratio 0.02) on 08/20/2019, 47.2 (ratio 0.13) on 10/25/2019, 22.4 (ratio 0.21) on 11/25/2019, 16.5 (ratio 0.33) on 01/10/2020, 14.6 (ratio 0.34) on 02/07/2020, 13.1 (ratio 0.31) on 03/07/2020, 10.1 (ratio 0.38) on 04/10/2020, and 9.5 (ratio 0.21) on 06/19/2020.   24 hour UPEP on 06/03/2019 revealed kappa free light chains 95.76, lambda free light chains 1,260.71, and ratio 0.08.  24 hour UPEP on 08/23/2019 revealed total protein of 782 mg/24 hrs with lambda free light chains 1,084.16 mg/L and ratio of 0.10 (1.03-31.76).  M spike in urine was 46.1% (361 mg/24 hrs).    Bone survey on 04/08/2016 and 05/28/2017 revealed no definite lytic lesion seen in the visualized skeleton.  Bone survey on 11/19/2018 revealed no suspicious lucent lesions and no acute bony abnormality.  PET scan on 07/12/2019 revealed no focal metabolic activity to suggest active myeloma within the skeleton. There were no lytic lesions identified on the CT portion of the exam or soft tissue plasmacytomas. There was no evidence of multiple myeloma.    Pretreatment RBC phenotype on 09/23/2019 was positive for C, e, DUFFY B, KIDD B, M, S, and s antigen; negative for c, E, KELL, DUFFY A, KIDD A, and N antigen.    09/27/2019 - 10/25/2019; 12/09/2019 - 03/13/2020 6 cycles of daratumumab and hyaluronidase-fihj, Pomalyst, and Decadron (DPd) .  Cycle #1 was complicated by fever and neutropenia requiring admission.  Cycle #2 was complicated by pneumonia requiring admission.  Cycle #6 was complicated with an ER evaluation for an elevated lactic acid.   05/10/2020 second autologous stem cell transplant at [facility]   She underwent conditioning with melphalan 140 mg/m2.    She is s/p week #3 Velcade maintenance (began 08/31/2020 -  09/28/2020).  She has no increase in neuropathy.  She has severe aortic stenosis.  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 Cardiology is following for a possible TAVR in the future.  Echo on 07/05/2020 revealed moderate to severe aortic stenosis with an EF of 50-55%.  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 Zometa was discontinued in 01/2017.  She has chronic nausea on Phenergan.     B12 deficiency.  B12 was 254 on 04/09/2017, 295 on 08/20/2018, and 391 on 10/08/2018.  She was on oral B12.  She received B12 monthly (last 09/14/2020).  Folate was 12.1 on 01/10/2020.    She has iron deficiency.  Ferritin was 32 on 07/01/2019.  She received Venofer on 07/15/2019 and 07/22/2019.   She has hypogammaglobulinemia.  IgG was 245 on 11/25/2019.  She received monthly IVIG (07/22/021 - 03/22/2020).  She received IVIG 400 mg/kg on 01/20/2020, 200 mg/kg on 02/17/2020, and 300 mg/kg on 03/22/2020.  IVIG on 46/96/2952 was complicated by acute renal failure. IgG trough level was 418 on 02/17/2020.  10/15/2019 - 10/20/2019 She was admitted to [facility] with fever and neutropenia.  Cultures were negative.  CXR was negative. She received broad spectrum antibiotics and daily Granix.  She received IVF for acute renal insufficiency due to diarrhea and dehydration.  Creatine was 1.66  on admission and 1.12 on discharge.    03/01/2020 - 03/05/2020 admitted to [facility] from with fever and neutropenia. CXR revealed no active cardiopulmonary disease. Chest CT with contrast revealed no acute intrathoracic pathology. There were findings which could be suggestive of prior granulomatous disease. She was treated with Cefepime and Vancomycin, then switched to ciprofloxacin on 03/03/2020.   05/08/2020 - 12/05/2021The patient was admitted to [facility] from  for autologous bone marrow transplant.   She received conditioning with melphalan 140 mg/m2.   Bone marrow transplant was on 05/10/2020. The patient developed fevers daily from 05/19/2020 - 05/24/2020. Blood cultures were + for strept sanguis bacteremia.  She was treated cefepime, vancomycin, and solumedrol for possible engraftment syndrome. Cefepime was switched to ceftriaxone; she completed 2 weeks of ceftriaxone on 06/03/2020. She had chemotherapy induced diarrhea.  She experienced urinary retention.  Feb 2022 patient has been on maintenance Velcade 2 mg every 2 weeks.  Chronic diarrhea seen by gastroenterology Dr. Vicente Males and was recommended to avoid artificial sugars in her diet including Diet Coke and coffee mate.  She feels some improvement of her diarrhea frequency since the dietary modification.  GI profile PCR negative, C. difficile toxin A+ B negative, fecal calprotectin 77 12/19/2020 colonoscopy showed 2 subcentimeter polyps in the ascending colon, resected and removed.  Pathology showed tubular adenoma.  No malignancy.  Normal mucosa in the entire examined colon.  Biopsied, pathology showed colonic mucosa with no significant pathological alteration.  Negative for active inflammation and features of chronicity.  Negative for microscopic colitis, dysplasia, and malignanc  Diabetes, patient has discontinued metformin due to diarrhea and started on glipizide 2.5 mg   03/05/2021-03/09/2021 Patient was hospitalized due to fever and chills, nonproductive cough, shortness of breath. X-ray showed right lower lobe atelectasis versus pneumonia.  Blood cultures negative.  Urine culture showed 60,000 colonies of Enterococcus facialis.  Patient received antibiotics. Patient also had abdominal pelvis CT scan for work-up of intermittent lower abdomen pain.No acute abnormality.   05/14/21 last dose of Velcade maintenance.  establish care with neurology Dr. Gurney Maxin and her neuropathy regimen has been switched from gabapentin to Lyrica. 05/29/2021 patient got influenza   neuropathy medication  has been switched from gabapentin/Lyrica to nortriptyline.  08/18/2021 Hospitalized due to pneumonia from metapnuemovirus, hospitalization was complicated with NSTEMI. She received heparin gtt.  Treated with antibiotics for coverage of pneumonia,  diuretics for pulmonary edema. She received IVIG 437m/kg x 1 for hypoglobulinemia. Discharged on 08/25/21 with Augmentin and Zithromax.   May 2023, Daratumumab being held for [facility] infectious disease physician Dr. GFatima Sangerdue to frequent infection.  INTERVAL HISTORY CNabiha Planckis a 66y.o. female who has above history reviewed by me today presents for follow up visit for management of multiple myeloma.  Patient has been on weekly magnesium infusion as needed hypomagnesemia Patient gets IVIG if immunoglobulin level < 500 She feels well. Diagnosed with CHF [HFrEF] -NYHA class II-III, she follows up with cardiology, started on entresto     Past Medical History:  Diagnosis Date   Anemia    Anxiety    Aortic stenosis    a. 05/2020 Echo: EF 45-50%, sev AS - [facility] CTA sugg of tricuspid valve w/ fusing of 2 leaflets-TAVR deferred in setting of acute infxn; b. 07/2020 Echo: EF 50-55%, mod-sev AS; c. 12/2020 Echo: EF>55%. Mod-sev paradoxical low-flow low-gradient AS; d. 08/2021 Echo: EF 35-40%, severe AS, triv AI, mild MR.   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 Variable EF over time; b. 08/2017 Echo: EF 40%; b. 03/2020 Echo: EF 55-60%; c. 05/2020 Echo: EF 45-50%; d. 07/2020 Echo: EF 50-55%; e. 12/2020 Echo: EF>55%; f. 08/2021 Echo: EF 35-40%.   Chronic heart failure with preserved ejection fraction (HFpEF) ([place])    a. Variable EF over time; b. 08/2017 Echo: EF 40%; b. 03/2020 Echo: EF 55-60%; c. 05/2020 Echo: EF 45-50%; d. 07/2020 Echo: EF 50-55%; e. 12/2020 Echo: EF>55%; f. 08/2021 Echo: EF 35-40%, no RV, mildly dil LA,  mild MR, triv AI, severe AS.   CKD (chronic kidney disease) stage 3, GFR 30-59 ml/min (HCC)    Depression    Diabetes mellitus (HCC)    Dizziness    Fatty liver    Frequent falls    GERD (gastroesophageal reflux disease)    Gout    Heart murmur    History of blood transfusion    History of bone marrow transplant ([place])    History of uterine fibroid    Hx of cardiac catheterization    a. 01/2016 Cath [facility] - after abnl nuc): Nl cors.   Hypertension    Hypomagnesemia    IDA (iron deficiency anemia)    Multiple myeloma (HCC)    Personal history of chemotherapy    PSVT (paroxysmal supraventricular tachycardia) ([place])    a. S/p [place] [facility]).   PVC's (premature ventricular contractions)    a. Well-managed w/ bisoprolol in outpt setting.   Renal cyst     Past Surgical History:  Procedure Laterality Date   ABDOMINAL HYSTERECTOMY     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   CARDIAC ELECTROPHYSIOLOGY MAPPING AND ABLATION     CARPAL TUNNEL RELEASE Bilateral    CHOLECYSTECTOMY  2008   COLONOSCOPY WITH PROPOFOL N/A 05/07/2017   Procedure: COLONOSCOPY WITH PROPOFOL;  Surgeon: [name], MD;  Location: [facility] ENDOSCOPY;  Service: Gastroenterology;  Laterality: N/A;   COLONOSCOPY WITH PROPOFOL N/A 12/19/2020   Procedure: COLONOSCOPY WITH PROPOFOL;  Surgeon: [name], MD;  Location: [facility] ENDOSCOPY;  Service: Gastroenterology;  Laterality: N/A;   ESOPHAGOGASTRODUODENOSCOPY (EGD) WITH PROPOFOL N/A 05/07/2017   Procedure: ESOPHAGOGASTRODUODENOSCOPY (EGD) WITH PROPOFOL;  Surgeon: [name], MD;  Location: [facility] ENDOSCOPY;  Service: Gastroenterology;  Laterality: N/A;   FOOT SURGERY Bilateral    INCONTINENCE SURGERY  2009   INTERSTIM IMPLANT PLACEMENT     other     over active bladder   OTHER SURGICAL HISTORY     bladder stimulator    PARTIAL HYSTERECTOMY  03/1996   fibroids   PORTA CATH INSERTION N/A 03/10/2019   Procedure: PORTA CATH INSERTION;  Surgeon: Algernon Huxley, MD;  Location:  [place] CV LAB;  Service: Cardiovascular;  Laterality: N/A;   TONSILLECTOMY  2007    Family History  Problem Relation Age of Onset   Colon cancer Father    Renal Disease Father    Diabetes Mellitus II Father    Melanoma Paternal Grandmother    Breast cancer Maternal Aunt 76   Anemia Mother    Heart disease Mother    Heart failure Mother    Renal Disease Mother    Congestive Heart Failure Mother    Heart disease Maternal Uncle    Throat cancer Maternal Uncle    Lung cancer Maternal Uncle    Liver disease Maternal Uncle    Heart  failure Maternal Uncle    Hearing loss Son 14       Suicide     Social History:  reports that she quit smoking about 30 years ago. Her smoking use included cigarettes. She has a 20.00 pack-year smoking history. She has never used smokeless tobacco. She reports current alcohol use. She reports that she does not use drugs.  She is on disability. She notes exposure to perchloroethylene [facility]).   Allergies:  Allergies  Allergen Reactions   Oxycodone-Acetaminophen Anaphylaxis    Swelling and rash   Celebrex [Celecoxib] Diarrhea   Codeine    Plerixafor     In 2016 during ASCT collection patient developed fever to 103.31F and required hospitalization   Benadryl [Diphenhydramine] Palpitations   Morphine Itching and Rash   Ondansetron Diarrhea   Tylenol [Acetaminophen] Itching and Rash    Current Medications: Current Outpatient Medications  Medication Sig Dispense Refill   acyclovir (ZOVIRAX) 400 MG tablet Take 1 tablet (400 mg total) by mouth 2 (two) times daily. 180 tablet 1   allopurinol (ZYLOPRIM) 100 MG tablet TAKE 1 TABLET(100 MG) BY MOUTH DAILY as needed 90 tablet 1   atorvastatin (LIPITOR) 10 MG tablet Take 0.5 tablets (5 mg total) by mouth daily. 15 tablet 11   bisoprolol (ZEBETA) 5 MG tablet Take 1 tablet (5 mg total) by mouth daily. 30 tablet 2   clotrimazole (MYCELEX) 10 MG troche Take 10 mg by mouth 3 (three) times daily as needed.      diclofenac sodium (VOLTAREN) 1 % GEL Apply 2 g topically 4 (four) times daily. 100 g 1   diphenoxylate-atropine (LOMOTIL) 2.5-0.025 MG tablet Take 2 tablets by mouth 4 (four) times daily as needed for diarrhea or loose stools. 64 tablet 3   doxycycline (VIBRAMYCIN) 50 MG capsule Take 50 mg by mouth 2 (two) times daily.     DULoxetine (CYMBALTA) 60 MG capsule TAKE 1 CAPSULE(60 MG) BY MOUTH DAILY 90 capsule 0   ENTRESTO 24-26 MG Take 1 tablet by mouth 2 (two) times daily.     FLUoxetine (PROZAC) 40 MG capsule TAKE 1 CAPSULE EVERY DAY 90 capsule 0   furosemide (LASIX) 20 MG tablet Take 1 tablet (20 mg total) by mouth daily as needed for edema (If > 2 pounds in one day or >5 pounds over several days). (Patient taking differently: Take 40 mg by mouth daily.) 30 tablet 11   glipiZIDE (GLUCOTROL XL) 2.5 MG 24 hr tablet Take 1 tablet (2.5 mg total) by mouth daily with breakfast. 90 tablet 1   glucose blood test strip      guaiFENesin-dextromethorphan (ROBITUSSIN DM) 100-10 MG/5ML syrup Take 5 mLs by mouth every 4 (four) hours as needed for cough. 118 mL 0   montelukast (SINGULAIR) 10 MG tablet Take 1 tablet (10 mg total) by mouth See admin instructions. Take 1 tab daily for 2 days after daratumumab treatments 30 tablet 1   Multiple Vitamin (MULTIVITAMIN) tablet Take 1 tablet by mouth daily.     omeprazole (PRILOSEC) 40 MG capsule TAKE 1 CAPSULE (40 MG TOTAL) BY MOUTH IN THE MORNING AND AT BEDTIME. 180 capsule 0   pentoxifylline (TRENTAL) 400 MG CR tablet Take 400 mg by mouth 3 (three) times daily.     promethazine (PHENERGAN) 25 MG tablet Take 1 tablet (25 mg total) by mouth every 6 (six) hours as needed for nausea or vomiting. 90 tablet 1   tiZANidine (ZANAFLEX) 4 MG tablet Take 1 tablet (4 mg total) by  mouth every 6 (six) hours as needed for muscle spasms. 90 tablet 0   traMADol (ULTRAM) 50 MG tablet Take 50 mg by mouth 2 (two) times daily.     traZODone (DESYREL) 100 MG tablet TAKE 1 TABLET AT BEDTIME 90  tablet 0   nitroGLYCERIN (NITROSTAT) 0.4 MG SL tablet Place 1 tablet (0.4 mg total) under the tongue every 5 (five) minutes as needed for chest pain. (Patient not taking: Reported on 03/25/2022) 20 tablet 12   No current facility-administered medications for this visit.   Facility-Administered Medications Ordered in Other Visits  Medication Dose Route Frequency Provider Last Rate Last Admin   cyanocobalamin (VITAMIN B12) injection 1,000 mcg  1,000 mcg Intramuscular Once Earlie Server, MD       heparin lock flush 100 unit/mL  500 Units Intracatheter Once PRN Earlie Server, MD       magnesium sulfate IVPB 4 g 100 mL  4 g Intravenous Once Earlie Server, MD 50 mL/hr at 03/25/22 1036 4 g at 03/25/22 1036   And   magnesium sulfate IVPB 2 g 50 mL  2 g Intravenous Once Earlie Server, MD       sodium chloride flush (NS) 0.9 % injection 10 mL  10 mL Intravenous PRN Earlie Server, MD   10 mL at 04/02/21 1062    Review of Systems  Constitutional:  Negative for chills, fever, malaise/fatigue and weight loss.  HENT:  Negative for sore throat.   Eyes:  Negative for redness.  Respiratory:  Negative for cough, shortness of breath and wheezing.   Cardiovascular:  Negative for chest pain, palpitations and leg swelling.  Gastrointestinal:  Positive for diarrhea and nausea. Negative for abdominal pain, blood in stool and vomiting.  Genitourinary:  Negative for dysuria.  Musculoskeletal:  Positive for back pain and joint pain. Negative for myalgias.  Skin:  Negative for rash.  Neurological:  Positive for tingling and sensory change. Negative for dizziness and tremors.  Endo/Heme/Allergies:  Does not bruise/bleed easily.  Psychiatric/Behavioral:  Negative for hallucinations.     Performance status (ECOG): 1  Vitals Blood pressure 130/66, pulse 96, temperature (!) 97.1 F (36.2 C), resp. rate 18, weight 156 lb 6.4 oz (70.9 kg).  Physical Exam Vitals and nursing note reviewed.  Constitutional:      General: She is not in acute  distress.    Appearance: She is well-developed. She is not ill-appearing, toxic-appearing or diaphoretic.     Interventions: Face mask in place.  HENT:     Head: Normocephalic and atraumatic.     Mouth/Throat:     Mouth: Mucous membranes are moist.     Pharynx: Oropharynx is clear.  Eyes:     General: No scleral icterus.    Pupils: Pupils are equal, round, and reactive to light.  Cardiovascular:     Rate and Rhythm: Normal rate and regular rhythm.     Heart sounds: Murmur heard.  Pulmonary:     Effort: Pulmonary effort is normal. No respiratory distress.     Breath sounds: Normal breath sounds. No wheezing or rales.  Chest:     Chest wall: No tenderness.  Abdominal:     General: There is no distension.     Palpations: Abdomen is soft. There is no hepatomegaly, splenomegaly or mass.  Musculoskeletal:        General: No tenderness. Normal range of motion.     Cervical back: Normal range of motion and neck supple.     Right lower  leg: No edema.     Left lower leg: No edema.  Lymphadenopathy:     Head:     Right side of head: No preauricular, posterior auricular or occipital adenopathy.     Left side of head: No preauricular, posterior auricular or occipital adenopathy.     Cervical: No cervical adenopathy.     Upper Body:     Right upper body: No supraclavicular adenopathy.     Left upper body: No supraclavicular adenopathy.  Skin:    General: Skin is warm and dry.  Neurological:     Mental Status: She is alert and oriented to person, place, and time. Mental status is at baseline.  Psychiatric:        Mood and Affect: Mood normal.    Laboratory data    Latest Ref Rng & Units 03/25/2022    9:24 AM 02/25/2022    9:01 AM 02/18/2022    8:50 AM  CBC  WBC 4.0 - 10.5 K/uL 5.1  7.2  8.2   Hemoglobin 12.0 - 15.0 g/dL 9.7  11.2  11.1   Hematocrit 36.0 - 46.0 % 30.6  34.0  35.3   Platelets 150 - 400 K/uL 116  128  151       Latest Ref Rng & Units 03/25/2022    9:24 AM 03/19/2022     8:25 AM 03/12/2022    8:38 AM  CMP  Glucose 70 - 99 mg/dL 163  168  166   BUN 8 - 23 mg/dL 22  28  24    Creatinine 0.44 - 1.00 mg/dL 1.10  1.33  1.29   Sodium 135 - 145 mmol/L 138  138  135   Potassium 3.5 - 5.1 mmol/L 3.9  3.9  3.6   Chloride 98 - 111 mmol/L 110  104  101   CO2 22 - 32 mmol/L 23  25  26    Calcium 8.9 - 10.3 mg/dL 8.2  9.0  8.4   Total Protein 6.5 - 8.1 g/dL 6.5     Total Bilirubin 0.3 - 1.2 mg/dL 0.2     Alkaline Phos 38 - 126 U/L 105     AST 15 - 41 U/L 38     ALT 0 - 44 U/L 32

## 2022-03-25 NOTE — Assessment & Plan Note (Addendum)
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   Previously on Lyrica and nortriptyline, she declined due to didn't notice much improvement   Neurology recommend Tramadol and may consider higher dose of  Nortriptyline in the future. Follows up with neurology. 

## 2022-03-25 NOTE — ED Provider Triage Note (Signed)
Emergency Medicine Provider Triage Evaluation Note  Sarah [name] , a 66 y.o. female  was evaluated in triage.  Pt complains of chest pain, shortness of breath.  Patient has history of multiple myeloma.  Also has a history of CHF.  She is taking Lasix.  Felt more short of breath especially with exertion.  Nonproductive cough, decreased O2 sats at home.  Is complaining of chest tightness but not frank pain..  Review of Systems  Positive: Shortness of breath, chest tightness, hypoxia, tachycardia Negative: Fevers, chills, nasal congestion, sore throat, GI symptoms  Physical Exam  BP (!) 131/92 (BP Location: Right Arm)   Pulse 92   Temp 99.3 F (37.4 C) (Oral)   Resp 18   Ht 5' 2"  (1.575 m)   Wt 70.3 kg   SpO2 96%   BMI 28.35 kg/m  Gen:   Awake, no distress   Resp:  Normal effort  MSK:   Moves extremities without difficulty  Other:    Medical Decision Making  Medically screening exam initiated at 9:28 PM.  Appropriate orders placed.  Sabrin Dunlevy was informed that the remainder of the evaluation will be completed by another provider, this initial triage assessment does not replace that evaluation, and the importance of remaining in the ED until their evaluation is complete.  Patient with chest tightness, shortness of breath with history of multiple myeloma, CHF.  Hypoxia, tachycardia at home.  Will order labs, x-ray   Brynda Peon 03/25/22 2128

## 2022-03-25 NOTE — Assessment & Plan Note (Signed)
Continue B12 monthly injection.  

## 2022-03-25 NOTE — Assessment & Plan Note (Signed)
PRN IVIG infusion if IgG level < 500. 

## 2022-03-25 NOTE — Assessment & Plan Note (Signed)
Recommend IV venofer weekly x4 She previously tolerated

## 2022-03-25 NOTE — Assessment & Plan Note (Addendum)
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  Light chain ratio is normal. 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 Repeat immunofixation negative. . Labs reviewed and discussed with patient, stable M protein and light chain ratio.  Daratumumab on hold due to frequent infections despite IVIG Continue acyclovir prophylaxis.

## 2022-03-25 NOTE — Assessment & Plan Note (Signed)
#  Chronic hypomagnesemia proceed with  IV magnesium today.  Continue weekly magnesium +/- IV magnesium.   Continue Slow-Mag 1 tab every other day.

## 2022-03-25 NOTE — Telephone Encounter (Signed)
Pt informed of plan and verbalized understanding.   Please schedule pt for IV venofer weekly x4. She prefers same day as mag infusion since she has to come weekly for those infusions as well.

## 2022-03-25 NOTE — Addendum Note (Signed)
Addended by: Earlie Server on: 03/25/2022 01:04 PM   Modules accepted: Orders

## 2022-03-25 NOTE — Telephone Encounter (Signed)
-----   Message from Earlie Server, MD sent at 03/25/2022  1:09 PM EDT ----- Iron is low. Please arrange her to get weekly IV venofer x 4.

## 2022-03-25 NOTE — Assessment & Plan Note (Signed)
-  [facility]- Post transplant RN coordinator ([phone]). 

## 2022-03-25 NOTE — ED Triage Notes (Signed)
Pt arrives from home via [facility], states chest congestion, SHOB,and home O2 dropped to 88 RA, HR 120-125 and wheezing.  States HX of CHF and multiple myeloma. States that she is taking her lasix as prescribed but urinating less often than normal. Pt reports SHOB while speaking, O2 96 RA in triage.States chest tightness mid upper.

## 2022-03-25 NOTE — Assessment & Plan Note (Signed)
chronic diarrhea,antidiarrhea PRN. 

## 2022-03-26 ENCOUNTER — Emergency Department
Admission: EM | Admit: 2022-03-26 | Discharge: 2022-03-26 | Disposition: A | Discharge status: Home / Self Care | Payer: Medicare HMO | Attending: Emergency Medicine | Admitting: Emergency Medicine

## 2022-03-26 DIAGNOSIS — R0602 Shortness of breath: Secondary | ICD-10-CM

## 2022-03-26 LAB — KAPPA/LAMBDA LIGHT CHAINS
Kappa free light chain: 6.9 mg/L (ref 3.3–19.4)
Kappa, lambda light chain ratio: 0.93 (ref 0.26–1.65)
Lambda free light chains: 7.4 mg/L (ref 5.7–26.3)

## 2022-03-26 LAB — TROPONIN I (HIGH SENSITIVITY): Troponin I (High Sensitivity): 17 ng/L (ref <18)

## 2022-03-26 MED ORDER — FUROSEMIDE 10 MG/ML IJ SOLN
40.0000 mg | Freq: Once | INTRAMUSCULAR | Status: AC
  Administered 2022-03-26: 40 mg via INTRAVENOUS
  Filled 2022-03-26: qty 4

## 2022-03-26 NOTE — ED Provider Notes (Signed)
[facility] Provider Note    Event Date/Time   First MD Initiated Contact with Patient 03/26/22 [phone]     (approximate)   History   Shortness of Breath   HPI  Sarah [name] is a 66 y.o. female past medical history of multiple myeloma, hypertension, diabetes hyperlipidemia CHF who presents with shortness of breath.  Yesterday after eating a meal and drinking patient felt acutely short of breath.  Felt chest tightness as well.  She felt like she was wheezing.  When EMS got there apparently she was wheezing as well.  Checked her pulse ox and it was 89%.  Since coming to the emergency department she has felt significantly improved.  Does still feel mildly short of breath but not significantly nothing like before.  She does still have chest tightness.  Tells me she has been dealing with the chest tightness for several months and is seeing her cardiologist at [facility] for work-up of this.  Denies cough fevers or chills.  Denies history of DVT hemoptysis.  Denies any lower extremity edema does feel that her abdomen is somewhat full and this is typically where she carries her fluid.  She takes her Lasix as needed for weight gain.  Her weight has been rather stable.  Did take 40 p.o. Lasix yesterday.  Patient had a cath at [facility] on 5/3 that had mild aortic stenosis no flow-limiting coronary disease.  His last EF is 35 to 40%.     Past Medical History:  Diagnosis Date   Anemia    Anxiety    Aortic stenosis    a. 05/2020 Echo: EF 45-50%, sev AS - seen by TAVR team @ [facility] - CTA sugg of tricuspid valve w/ fusing of 2 leaflets-TAVR deferred in setting of acute infxn; b. 07/2020 Echo: EF 50-55%, mod-sev AS; c. 12/2020 Echo: EF>55%. Mod-sev paradoxical low-flow low-gradient AS; d. 08/2021 Echo: EF 35-40%, severe AS, triv AI, mild MR.   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 Variable EF over time; b. 08/2017 Echo: EF 40%; b. 03/2020 Echo: EF 55-60%; c. 05/2020 Echo: EF 45-50%; d. 07/2020 Echo: EF 50-55%; e. 12/2020 Echo: EF>55%; f. 08/2021 Echo: EF 35-40%.   Chronic heart failure with preserved ejection fraction (HFpEF) ([place])    a. Variable EF over time; b. 08/2017 Echo: EF 40%; b. 03/2020 Echo: EF 55-60%; c. 05/2020 Echo: EF 45-50%; d. 07/2020 Echo: EF 50-55%; e. 12/2020 Echo: EF>55%; f. 08/2021 Echo: EF 35-40%, no RV, mildly dil LA, mild MR, triv AI, severe AS.   CKD (chronic kidney disease) stage 3, GFR 30-59 ml/min (HCC)    Depression    Diabetes mellitus (HCC)    Dizziness    Fatty liver    Frequent falls    GERD (gastroesophageal reflux disease)    Gout    Heart murmur    History of blood transfusion    History of bone marrow transplant ([place])    History of uterine fibroid    Hx of cardiac catheterization    a. 01/2016 Cath [facility] - after abnl nuc): Nl cors.   Hypertension    Hypomagnesemia    IDA (iron deficiency anemia)    Multiple myeloma (HCC)    Personal history of chemotherapy    PSVT (paroxysmal supraventricular tachycardia) ([place])    a. S/p [place] [facility]).  PVC's (premature ventricular contractions)    a. Well-managed w/ bisoprolol in outpt setting.   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   Overweight (BMI 25.0-29.9) 02/04/2022   Bacterial URI 01/07/2022   Complete left bundle branch block 09/06/2021   Anemia of chronic renal failure, stage 3a ([place]) 09/06/2021   Gastroesophageal reflux disease without esophagitis 09/06/2021   Cardiomyopathy ([place])    Acute HFrEF (heart failure with reduced ejection fraction) ([place]) 08/22/2021   NSTEMI (non-ST elevated myocardial infarction) ([place]) 08/21/2021   Low immunoglobulin level    Bronchitis    Fever    Numbness and tingling 07/23/2021   Acute gingivitis 06/24/2021   URI (upper respiratory infection) 06/23/2021   Chronic  diarrhea 06/23/2021   Multiple myeloma in remission ([place]) 05/14/2021   Leg swelling 04/02/2021   Neuropathy 04/02/2021   Fever in adult 03/05/2021   Frequent PVCs 12/04/2020   Diarrhea 10/25/2020   Encounter for antineoplastic chemotherapy 08/31/2020   Coronary artery disease involving native coronary artery of native heart 04/26/2020   Yeast vaginitis 03/18/2020   Hypokalemia 03/11/2020   Dyspnea 03/02/2020   Physical deconditioning 02/07/2020   Impaired nutrition 02/07/2020   Encounter for antineoplastic immunotherapy 10/03/2019   Hypocalcemia 08/28/2019   Chronic nausea 12/17/2018   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   Hyperlipidemia associated with type 2 diabetes mellitus ([place]) 11/25/2018   Anemia 10/21/2018   Hypomagnesemia 08/20/2018   Goals of care, counseling/discussion 08/20/2018   B12 deficiency 08/20/2018   Thrombocytopenia ([place]) 02/09/2018   Benign essential HTN 08/21/2017   Carotid artery stenosis, asymptomatic, bilateral 08/06/2017   Thyroid nodule 08/06/2017   Iron deficiency anemia due to chronic blood loss 05/13/2017   Spinal stenosis of lumbar region with neurogenic claudication 04/09/2017   Long term prescription opiate use 09/07/2016   Chronic pain syndrome 07/08/2016   Pain medication agreement signed 07/08/2016   Spondylosis of lumbar region without myelopathy or radiculopathy 07/08/2016   Nonrheumatic aortic valve stenosis 06/05/2016   Environmental and seasonal allergies 04/19/2016   Insomnia 04/19/2016   Asthma 04/19/2016   Aortic regurgitation 04/19/2016   Type II diabetes mellitus with complication ([place]) 04/88/8916   Depression, major, single episode, in partial remission ([place]) 04/12/2016   Irritable bowel syndrome (IBS) 04/12/2016   Hepatic steatosis 04/12/2016   Multiple myeloma not having achieved remission ([place]) 04/02/2016   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[place]) 07/06/2015   Stem cell transplant candidate 05/16/2015      Physical Exam  Triage Vital Signs: ED Triage Vitals  Enc Vitals Group     BP 03/25/22 2113 (!) 131/92     Pulse Rate 03/25/22 2113 92     Resp 03/25/22 2113 18     Temp 03/25/22 2113 99.3 F (37.4 C)     Temp Source 03/25/22 2113 Oral     SpO2 03/25/22 2113 96 %     Weight 03/25/22 2126 155 lb (70.3 kg)     Height 03/25/22 2126 5' 2"  (1.575 m)     Head Circumference --      Peak Flow --      Pain Score 03/25/22 2125 3     Pain Loc --      Pain Edu? --      Excl. in [place]? --     Most recent vital signs: Vitals:   03/26/22 0830 03/26/22 0845  BP: 125/64   Pulse: 88 92  Resp:    Temp:    SpO2: 98% 96%     General: Awake, no distress.  CV:  Good peripheral perfusion.  No pitting edema Resp:  Normal effort.  No increased work of breathing, trace crackles at the bases bilaterally Abd:  No distention.  Abdomen is soft no significant distention Neuro:             Awake, Alert, Oriented x 3  Other:     ED Results / Procedures / Treatments  Labs (all labs ordered are listed, but only abnormal results are displayed) Labs Reviewed  BRAIN NATRIURETIC PEPTIDE - Abnormal; Notable for the following components:      Result Value   B Natriuretic Peptide 675.9 (*)    All other components within normal limits  COMPREHENSIVE METABOLIC PANEL - Abnormal; Notable for the following components:   Glucose, Bld 177 (*)    Creatinine, Ser 1.04 (*)    Calcium 8.3 (*)    GFR, Estimated 60 (*)    All other components within normal limits  CBC WITH DIFFERENTIAL/PLATELET - Abnormal; Notable for the following components:   Hemoglobin 10.3 (*)    HCT 32.9 (*)    MCV 78.3 (*)    MCH 24.5 (*)    RDW 15.8 (*)    Platelets 145 (*)    All other components within normal limits  TROPONIN I (HIGH SENSITIVITY)  TROPONIN I (HIGH SENSITIVITY)     EKG  EKG reviewed and interpreted by myself shows sinus rhythm with a left bundle branch block no [place] criteria, frequent  PVCs   RADIOLOGY Chest x-ray reviewed by myself shows mild interstitial edema   PROCEDURES:  Critical Care performed: No  Procedures  The patient is on the cardiac monitor to evaluate for evidence of arrhythmia and/or significant heart rate changes.   MEDICATIONS ORDERED IN ED: Medications  furosemide (LASIX) injection 40 mg (40 mg Intravenous Given 03/26/22 0902)     IMPRESSION / MDM / ASSESSMENT AND PLAN / ED COURSE  I reviewed the triage vital signs and the nursing notes.                              Patient's presentation is most consistent with acute presentation with potential threat to life or bodily function.  Differential diagnosis includes, but is not limited to, CHF exacerbation, pneumothorax, pulmonary embolism, pneumonia  The patient is 66 year old female with history of CHF who presents with shortness of breath.  Started after she was eating and drinking came on rather acutely.  She does endorse some chest tightness but this tightness has been going on for several months and is currently being evaluated by her outpatient cardiologist.  She was waiting for about 10 hours in the waiting room prior to my evaluation.  During that time she has felt significantly improved.  Does continue to have some mild chest tightness but her work of breathing is significantly improved.  On exam she has some rales at the bases but does not appear significantly volume overloaded.  She is satting in the high 90s on room air is not tachypneic has no increased work of breathing.  Chest x-ray shows some mild pulmonary edema.  Troponins are flat.  Creatinine is actually improved from prior.  Hemoglobin near baseline.  BNP mildly elevated at 600.  EKG showing left bundle with PVCs but no ischemic changes.  Suspect mild CHF exacerbation.  Patient given dose  of IV Lasix and she did diurese.  Continues to be satting 99% on room air.  Given she is not hypoxic and feels clinically improved I think she is  appropriate for outpatient discharge and follow-up with her cardiologist.  She has been taking her Lasix as needed I recommend she start taking this daily for the next several days until she is feeling completely improved and can follow-up with her cardiologist.       FINAL CLINICAL IMPRESSION(S) / ED DIAGNOSES   Final diagnoses:  SOB (shortness of breath)     Rx / DC Orders   ED Discharge Orders     None        Note:  This document was prepared using Dragon voice recognition software and may include unintentional dictation errors.   Rada Hay, MD 03/26/22 1012

## 2022-03-26 NOTE — Discharge Instructions (Addendum)
You likely are having a mild CHF exacerbation.  Please start taking Lasix daily until he can follow-up with your cardiologist.  I would call the office today so you can be seen sooner than next month.  Please return to the emergency department if you develop sustained shortness of breath or any new symptoms such as chest pain that is different from your typical chest pain.

## 2022-03-26 NOTE — ED Notes (Signed)
Pt came in for SOB and is breathing [facility]N reports she is not SOB like she was feeling last night when she came in but is still reporting a little chest tightness.

## 2022-03-29 LAB — MULTIPLE MYELOMA PANEL, SERUM
Albumin SerPl Elph-Mcnc: 3.4 g/dL (ref 2.9–4.4)
Albumin/Glob SerPl: 1.5 (ref 0.7–1.7)
Alpha 1: 0.3 g/dL (ref 0.0–0.4)
Alpha2 Glob SerPl Elph-Mcnc: 0.8 g/dL (ref 0.4–1.0)
B-Globulin SerPl Elph-Mcnc: 0.8 g/dL (ref 0.7–1.3)
Gamma Glob SerPl Elph-Mcnc: 0.4 g/dL (ref 0.4–1.8)
Globulin, Total: 2.3 g/dL (ref 2.2–3.9)
IgA: 27 mg/dL — ABNORMAL LOW (ref 87–352)
IgG (Immunoglobin G), Serum: 443 mg/dL — ABNORMAL LOW (ref 586–1602)
IgM (Immunoglobulin M), Srm: 11 mg/dL — ABNORMAL LOW (ref 26–217)
Total Protein ELP: 5.7 g/dL — ABNORMAL LOW (ref 6.0–8.5)

## 2022-04-01 ENCOUNTER — Inpatient Hospital Stay: Payer: Medicare HMO

## 2022-04-01 DIAGNOSIS — N182 Chronic kidney disease, stage 2 (mild): Secondary | ICD-10-CM | POA: Diagnosis not present

## 2022-04-01 DIAGNOSIS — I10 Essential (primary) hypertension: Secondary | ICD-10-CM | POA: Diagnosis not present

## 2022-04-01 DIAGNOSIS — D631 Anemia in chronic kidney disease: Secondary | ICD-10-CM | POA: Diagnosis not present

## 2022-04-01 DIAGNOSIS — E1122 Type 2 diabetes mellitus with diabetic chronic kidney disease: Secondary | ICD-10-CM | POA: Diagnosis not present

## 2022-04-02 MED FILL — Iron Sucrose Inj 20 MG/ML (Fe Equiv): INTRAVENOUS | Qty: 10 | Status: AC

## 2022-04-03 ENCOUNTER — Inpatient Hospital Stay: Payer: Medicare HMO | Attending: Oncology

## 2022-04-03 ENCOUNTER — Inpatient Hospital Stay: Payer: Medicare HMO

## 2022-04-03 VITALS — BP 113/76 | HR 78 | Temp 94.8°F | Resp 18

## 2022-04-03 DIAGNOSIS — C9000 Multiple myeloma not having achieved remission: Secondary | ICD-10-CM

## 2022-04-03 DIAGNOSIS — M2550 Pain in unspecified joint: Secondary | ICD-10-CM | POA: Insufficient documentation

## 2022-04-03 DIAGNOSIS — I5042 Chronic combined systolic (congestive) and diastolic (congestive) heart failure: Secondary | ICD-10-CM | POA: Diagnosis not present

## 2022-04-03 DIAGNOSIS — R071 Chest pain on breathing: Secondary | ICD-10-CM | POA: Insufficient documentation

## 2022-04-03 DIAGNOSIS — Z515 Encounter for palliative care: Secondary | ICD-10-CM | POA: Insufficient documentation

## 2022-04-03 DIAGNOSIS — T451X5A Adverse effect of antineoplastic and immunosuppressive drugs, initial encounter: Secondary | ICD-10-CM | POA: Insufficient documentation

## 2022-04-03 DIAGNOSIS — R058 Other specified cough: Secondary | ICD-10-CM | POA: Diagnosis not present

## 2022-04-03 DIAGNOSIS — N1832 Chronic kidney disease, stage 3b: Secondary | ICD-10-CM | POA: Insufficient documentation

## 2022-04-03 DIAGNOSIS — Z8249 Family history of ischemic heart disease and other diseases of the circulatory system: Secondary | ICD-10-CM | POA: Insufficient documentation

## 2022-04-03 DIAGNOSIS — M549 Dorsalgia, unspecified: Secondary | ICD-10-CM | POA: Diagnosis not present

## 2022-04-03 DIAGNOSIS — D801 Nonfamilial hypogammaglobulinemia: Secondary | ICD-10-CM | POA: Diagnosis not present

## 2022-04-03 DIAGNOSIS — G620 Drug-induced polyneuropathy: Secondary | ICD-10-CM | POA: Diagnosis not present

## 2022-04-03 DIAGNOSIS — Z803 Family history of malignant neoplasm of breast: Secondary | ICD-10-CM | POA: Insufficient documentation

## 2022-04-03 DIAGNOSIS — Z808 Family history of malignant neoplasm of other organs or systems: Secondary | ICD-10-CM | POA: Insufficient documentation

## 2022-04-03 DIAGNOSIS — F32A Depression, unspecified: Secondary | ICD-10-CM | POA: Diagnosis not present

## 2022-04-03 DIAGNOSIS — I130 Hypertensive heart and chronic kidney disease with heart failure and stage 1 through stage 4 chronic kidney disease, or unspecified chronic kidney disease: Secondary | ICD-10-CM | POA: Diagnosis not present

## 2022-04-03 DIAGNOSIS — Z87891 Personal history of nicotine dependence: Secondary | ICD-10-CM | POA: Insufficient documentation

## 2022-04-03 DIAGNOSIS — Z801 Family history of malignant neoplasm of trachea, bronchus and lung: Secondary | ICD-10-CM | POA: Insufficient documentation

## 2022-04-03 DIAGNOSIS — Z886 Allergy status to analgesic agent status: Secondary | ICD-10-CM | POA: Insufficient documentation

## 2022-04-03 DIAGNOSIS — K219 Gastro-esophageal reflux disease without esophagitis: Secondary | ICD-10-CM | POA: Diagnosis not present

## 2022-04-03 DIAGNOSIS — D509 Iron deficiency anemia, unspecified: Secondary | ICD-10-CM | POA: Insufficient documentation

## 2022-04-03 DIAGNOSIS — Z9049 Acquired absence of other specified parts of digestive tract: Secondary | ICD-10-CM | POA: Insufficient documentation

## 2022-04-03 DIAGNOSIS — I5032 Chronic diastolic (congestive) heart failure: Secondary | ICD-10-CM | POA: Diagnosis not present

## 2022-04-03 DIAGNOSIS — I252 Old myocardial infarction: Secondary | ICD-10-CM | POA: Insufficient documentation

## 2022-04-03 DIAGNOSIS — Z841 Family history of disorders of kidney and ureter: Secondary | ICD-10-CM | POA: Insufficient documentation

## 2022-04-03 DIAGNOSIS — C9001 Multiple myeloma in remission: Secondary | ICD-10-CM | POA: Insufficient documentation

## 2022-04-03 DIAGNOSIS — R339 Retention of urine, unspecified: Secondary | ICD-10-CM | POA: Diagnosis not present

## 2022-04-03 DIAGNOSIS — R197 Diarrhea, unspecified: Secondary | ICD-10-CM | POA: Diagnosis not present

## 2022-04-03 DIAGNOSIS — R7881 Bacteremia: Secondary | ICD-10-CM | POA: Insufficient documentation

## 2022-04-03 DIAGNOSIS — R110 Nausea: Secondary | ICD-10-CM | POA: Insufficient documentation

## 2022-04-03 DIAGNOSIS — Z885 Allergy status to narcotic agent status: Secondary | ICD-10-CM | POA: Insufficient documentation

## 2022-04-03 DIAGNOSIS — E1122 Type 2 diabetes mellitus with diabetic chronic kidney disease: Secondary | ICD-10-CM | POA: Diagnosis not present

## 2022-04-03 DIAGNOSIS — Z8379 Family history of other diseases of the digestive system: Secondary | ICD-10-CM | POA: Insufficient documentation

## 2022-04-03 DIAGNOSIS — Z9484 Stem cells transplant status: Secondary | ICD-10-CM | POA: Insufficient documentation

## 2022-04-03 DIAGNOSIS — E538 Deficiency of other specified B group vitamins: Secondary | ICD-10-CM | POA: Diagnosis not present

## 2022-04-03 DIAGNOSIS — Z800 Family history of malignant neoplasm of digestive organs: Secondary | ICD-10-CM | POA: Insufficient documentation

## 2022-04-03 DIAGNOSIS — Z79899 Other long term (current) drug therapy: Secondary | ICD-10-CM | POA: Insufficient documentation

## 2022-04-03 DIAGNOSIS — Z822 Family history of deafness and hearing loss: Secondary | ICD-10-CM | POA: Insufficient documentation

## 2022-04-03 DIAGNOSIS — Z833 Family history of diabetes mellitus: Secondary | ICD-10-CM | POA: Insufficient documentation

## 2022-04-03 DIAGNOSIS — Z888 Allergy status to other drugs, medicaments and biological substances status: Secondary | ICD-10-CM | POA: Insufficient documentation

## 2022-04-03 DIAGNOSIS — G8929 Other chronic pain: Secondary | ICD-10-CM | POA: Insufficient documentation

## 2022-04-03 DIAGNOSIS — Z832 Family history of diseases of the blood and blood-forming organs and certain disorders involving the immune mechanism: Secondary | ICD-10-CM | POA: Insufficient documentation

## 2022-04-03 DIAGNOSIS — Z9481 Bone marrow transplant status: Secondary | ICD-10-CM | POA: Insufficient documentation

## 2022-04-03 LAB — BASIC METABOLIC PANEL
Anion gap: 6 (ref 5–15)
BUN: 46 mg/dL — ABNORMAL HIGH (ref 8–23)
CO2: 24 mmol/L (ref 22–32)
Calcium: 8.1 mg/dL — ABNORMAL LOW (ref 8.9–10.3)
Chloride: 105 mmol/L (ref 98–111)
Creatinine, Ser: 1.46 mg/dL — ABNORMAL HIGH (ref 0.44–1.00)
GFR, Estimated: 40 mL/min — ABNORMAL LOW (ref >60)
Glucose, Bld: 154 mg/dL — ABNORMAL HIGH (ref 70–99)
Potassium: 3.3 mmol/L — ABNORMAL LOW (ref 3.5–5.1)
Sodium: 135 mmol/L (ref 135–145)

## 2022-04-03 LAB — MAGNESIUM: Magnesium: 0.8 mg/dL — CL (ref 1.7–2.4)

## 2022-04-03 MED ORDER — SODIUM CHLORIDE 0.9 % IV SOLN
200.0000 mg | Freq: Once | INTRAVENOUS | Status: AC
  Administered 2022-04-03: 200 mg via INTRAVENOUS
  Filled 2022-04-03: qty 10

## 2022-04-03 MED ORDER — MAGNESIUM SULFATE 2 GM/50ML IV SOLN
2.0000 g | Freq: Once | INTRAVENOUS | Status: AC
  Administered 2022-04-03: 2 g via INTRAVENOUS
  Filled 2022-04-03: qty 50

## 2022-04-03 MED ORDER — HEPARIN SOD (PORK) LOCK FLUSH 100 UNIT/ML IV SOLN
500.0000 [IU] | Freq: Once | INTRAVENOUS | Status: AC | PRN
  Administered 2022-04-03: 500 [IU]
  Filled 2022-04-03: qty 5

## 2022-04-03 MED ORDER — SODIUM CHLORIDE 0.9 % IV SOLN
Freq: Once | INTRAVENOUS | Status: AC
  Filled 2022-04-03: qty 250

## 2022-04-03 MED ORDER — SODIUM CHLORIDE 0.9 % IV SOLN
25.0000 mg | Freq: Once | INTRAVENOUS | Status: AC
  Administered 2022-04-03: 25 mg via INTRAVENOUS
  Filled 2022-04-03: qty 1

## 2022-04-03 MED ORDER — SODIUM CHLORIDE 0.9 % IV SOLN: 6.0000 g | Freq: Once | INTRAVENOUS | Status: DC

## 2022-04-03 MED ORDER — MAGNESIUM SULFATE 4 GM/100ML IV SOLN
4.0000 g | Freq: Once | INTRAVENOUS | Status: AC
  Administered 2022-04-03: 4 g via INTRAVENOUS
  Filled 2022-04-03: qty 100

## 2022-04-03 NOTE — Patient Instructions (Signed)
[facility] AT [place]  Discharge Instructions: Thank you for choosing [facility] to provide your oncology and hematology care.  If you have a lab appointment with the [facility], please go directly to the [facility] and check in at the registration area.  Wear comfortable clothing and clothing appropriate for easy access to any Portacath or PICC line.   We strive to give you quality time with your provider. You may need to reschedule your appointment if you arrive late (15 or more minutes).  Arriving late affects you and other patients whose appointments are after yours.  Also, if you miss three or more appointments without notifying the office, you may be dismissed from the clinic at the provider's discretion.      For prescription refill requests, have your pharmacy contact our office and allow 72 hours for refills to be completed.    Today you received the following chemotherapy and/or immunotherapy agents Venofer and Magnesium.      To help prevent nausea and vomiting after your treatment, we encourage you to take your nausea medication as directed.  BELOW ARE SYMPTOMS THAT SHOULD BE REPORTED IMMEDIATELY: *FEVER GREATER THAN 100.4 F (38 C) OR HIGHER *CHILLS OR SWEATING *NAUSEA AND VOMITING THAT IS NOT CONTROLLED WITH YOUR NAUSEA MEDICATION *UNUSUAL SHORTNESS OF BREATH *UNUSUAL BRUISING OR BLEEDING *URINARY PROBLEMS (pain or burning when urinating, or frequent urination) *BOWEL PROBLEMS (unusual diarrhea, constipation, pain near the anus) TENDERNESS IN MOUTH AND THROAT WITH OR WITHOUT PRESENCE OF ULCERS (sore throat, sores in mouth, or a toothache) UNUSUAL RASH, SWELLING OR PAIN  UNUSUAL VAGINAL DISCHARGE OR ITCHING   Items with * indicate a potential emergency and should be followed up as soon as possible or go to the Emergency Department if any problems should occur.  Please show the CHEMOTHERAPY ALERT CARD or IMMUNOTHERAPY ALERT CARD at  check-in to the Emergency Department and triage nurse.  Should you have questions after your visit or need to cancel or reschedule your appointment, please contact [facility] [place] [facility] [place]  [phone] and follow the prompts.  Office hours are 8:00 a.m. to 4:30 p.m. Monday - Friday. Please note that voicemails left after 4:00 p.m. may not be returned until the following business day.  We are closed weekends and major holidays. You have access to a nurse at all times for urgent questions. Please call the main number to the clinic [phone] and follow the prompts.  For any non-urgent questions, you may also contact your provider using MyChart. We now offer e-Visits for anyone 65 and older to request care online for non-urgent symptoms. For details visit [email or web address].   Also download the MyChart app! Go to the app store, search "MyChart", open the app, select , and log in with your MyChart username and password.  Masks are optional in the cancer centers. If you would like for your care team to wear a mask while they are taking care of you, please let them know. For doctor visits, patients may have with them one support person who is at least 65 years old. At this time, visitors are not allowed in the infusion area.

## 2022-04-04 ENCOUNTER — Inpatient Hospital Stay: Payer: Medicare HMO

## 2022-04-04 DIAGNOSIS — R071 Chest pain on breathing: Secondary | ICD-10-CM | POA: Diagnosis not present

## 2022-04-04 DIAGNOSIS — K219 Gastro-esophageal reflux disease without esophagitis: Secondary | ICD-10-CM | POA: Diagnosis not present

## 2022-04-04 DIAGNOSIS — C9001 Multiple myeloma in remission: Secondary | ICD-10-CM | POA: Diagnosis not present

## 2022-04-04 DIAGNOSIS — D509 Iron deficiency anemia, unspecified: Secondary | ICD-10-CM | POA: Diagnosis not present

## 2022-04-04 DIAGNOSIS — I252 Old myocardial infarction: Secondary | ICD-10-CM | POA: Diagnosis not present

## 2022-04-04 DIAGNOSIS — C9000 Multiple myeloma not having achieved remission: Secondary | ICD-10-CM

## 2022-04-04 DIAGNOSIS — R058 Other specified cough: Secondary | ICD-10-CM | POA: Diagnosis not present

## 2022-04-04 DIAGNOSIS — Z515 Encounter for palliative care: Secondary | ICD-10-CM | POA: Diagnosis not present

## 2022-04-04 DIAGNOSIS — E538 Deficiency of other specified B group vitamins: Secondary | ICD-10-CM | POA: Diagnosis not present

## 2022-04-04 LAB — MAGNESIUM
Magnesium: 2.2 mg/dL (ref 1.7–2.4)
Magnesium: 2.2 mg/dL (ref 1.7–2.4)

## 2022-04-04 MED ORDER — SODIUM CHLORIDE 0.9% FLUSH
10.0000 mL | Freq: Once | INTRAVENOUS | Status: AC | PRN
  Administered 2022-04-04: 10 mL
  Filled 2022-04-04: qty 10

## 2022-04-04 MED ORDER — SODIUM CHLORIDE 0.9 % IV SOLN
Freq: Once | INTRAVENOUS | Status: AC
  Filled 2022-04-04: qty 250

## 2022-04-04 MED ORDER — SODIUM CHLORIDE 0.9 % IV SOLN
25.0000 mg | Freq: Once | INTRAVENOUS | Status: AC
  Administered 2022-04-04: 25 mg via INTRAVENOUS
  Filled 2022-04-04: qty 1

## 2022-04-04 MED ORDER — HEPARIN SOD (PORK) LOCK FLUSH 100 UNIT/ML IV SOLN
500.0000 [IU] | Freq: Once | INTRAVENOUS | Status: AC | PRN
  Administered 2022-04-04: 500 [IU]
  Filled 2022-04-04: qty 5

## 2022-04-04 NOTE — Progress Notes (Signed)
Magnesium 2.2 this am. Send another blood sample to recheck result. It was 2.2 again. No magnesium needed today. Port deaccessed and pt discharged to home.

## 2022-04-05 MED FILL — Iron Sucrose Inj 20 MG/ML (Fe Equiv): INTRAVENOUS | Qty: 10 | Status: AC

## 2022-04-08 ENCOUNTER — Emergency Department: Payer: Medicare HMO

## 2022-04-08 ENCOUNTER — Inpatient Hospital Stay (HOSPITAL_BASED_OUTPATIENT_CLINIC_OR_DEPARTMENT_OTHER): Payer: Medicare HMO | Admitting: Hospice and Palliative Medicine

## 2022-04-08 ENCOUNTER — Inpatient Hospital Stay: Payer: Medicare HMO

## 2022-04-08 ENCOUNTER — Other Ambulatory Visit: Payer: Self-pay

## 2022-04-08 ENCOUNTER — Inpatient Hospital Stay
Admission: EM | Admit: 2022-04-08 | Discharge: 2022-04-10 | DRG: 194 | Disposition: A | Discharge status: Home / Self Care | Payer: Medicare HMO | Attending: Family Medicine | Admitting: Family Medicine

## 2022-04-08 ENCOUNTER — Encounter: Payer: Self-pay | Admitting: Intensive Care

## 2022-04-08 VITALS — BP 123/74 | HR 73 | Temp 98.6°F | Resp 18

## 2022-04-08 DIAGNOSIS — Z9481 Bone marrow transplant status: Secondary | ICD-10-CM | POA: Diagnosis not present

## 2022-04-08 DIAGNOSIS — I493 Ventricular premature depolarization: Secondary | ICD-10-CM | POA: Diagnosis not present

## 2022-04-08 DIAGNOSIS — K760 Fatty (change of) liver, not elsewhere classified: Secondary | ICD-10-CM | POA: Diagnosis present

## 2022-04-08 DIAGNOSIS — Z8701 Personal history of pneumonia (recurrent): Secondary | ICD-10-CM

## 2022-04-08 DIAGNOSIS — D509 Iron deficiency anemia, unspecified: Secondary | ICD-10-CM | POA: Diagnosis not present

## 2022-04-08 DIAGNOSIS — I2510 Atherosclerotic heart disease of native coronary artery without angina pectoris: Secondary | ICD-10-CM | POA: Diagnosis present

## 2022-04-08 DIAGNOSIS — R071 Chest pain on breathing: Secondary | ICD-10-CM | POA: Diagnosis not present

## 2022-04-08 DIAGNOSIS — C9001 Multiple myeloma in remission: Secondary | ICD-10-CM | POA: Diagnosis present

## 2022-04-08 DIAGNOSIS — E785 Hyperlipidemia, unspecified: Secondary | ICD-10-CM | POA: Diagnosis not present

## 2022-04-08 DIAGNOSIS — E1169 Type 2 diabetes mellitus with other specified complication: Secondary | ICD-10-CM | POA: Diagnosis not present

## 2022-04-08 DIAGNOSIS — Z79899 Other long term (current) drug therapy: Secondary | ICD-10-CM

## 2022-04-08 DIAGNOSIS — M1990 Unspecified osteoarthritis, unspecified site: Secondary | ICD-10-CM | POA: Diagnosis present

## 2022-04-08 DIAGNOSIS — R296 Repeated falls: Secondary | ICD-10-CM | POA: Diagnosis present

## 2022-04-08 DIAGNOSIS — N1832 Chronic kidney disease, stage 3b: Secondary | ICD-10-CM | POA: Diagnosis present

## 2022-04-08 DIAGNOSIS — C9000 Multiple myeloma not having achieved remission: Secondary | ICD-10-CM

## 2022-04-08 DIAGNOSIS — Z833 Family history of diabetes mellitus: Secondary | ICD-10-CM

## 2022-04-08 DIAGNOSIS — J90 Pleural effusion, not elsewhere classified: Secondary | ICD-10-CM | POA: Diagnosis not present

## 2022-04-08 DIAGNOSIS — Z9049 Acquired absence of other specified parts of digestive tract: Secondary | ICD-10-CM

## 2022-04-08 DIAGNOSIS — E1122 Type 2 diabetes mellitus with diabetic chronic kidney disease: Secondary | ICD-10-CM | POA: Diagnosis present

## 2022-04-08 DIAGNOSIS — Z79890 Hormone replacement therapy (postmenopausal): Secondary | ICD-10-CM

## 2022-04-08 DIAGNOSIS — I130 Hypertensive heart and chronic kidney disease with heart failure and stage 1 through stage 4 chronic kidney disease, or unspecified chronic kidney disease: Secondary | ICD-10-CM | POA: Diagnosis present

## 2022-04-08 DIAGNOSIS — R0789 Other chest pain: Secondary | ICD-10-CM | POA: Diagnosis not present

## 2022-04-08 DIAGNOSIS — R058 Other specified cough: Secondary | ICD-10-CM | POA: Diagnosis not present

## 2022-04-08 DIAGNOSIS — Q231 Congenital insufficiency of aortic valve: Secondary | ICD-10-CM

## 2022-04-08 DIAGNOSIS — Z90711 Acquired absence of uterus with remaining cervical stump: Secondary | ICD-10-CM

## 2022-04-08 DIAGNOSIS — I252 Old myocardial infarction: Secondary | ICD-10-CM | POA: Diagnosis not present

## 2022-04-08 DIAGNOSIS — Z87891 Personal history of nicotine dependence: Secondary | ICD-10-CM

## 2022-04-08 DIAGNOSIS — I429 Cardiomyopathy, unspecified: Secondary | ICD-10-CM | POA: Diagnosis present

## 2022-04-08 DIAGNOSIS — Z886 Allergy status to analgesic agent status: Secondary | ICD-10-CM

## 2022-04-08 DIAGNOSIS — Z1152 Encounter for screening for COVID-19: Secondary | ICD-10-CM | POA: Diagnosis not present

## 2022-04-08 DIAGNOSIS — I447 Left bundle-branch block, unspecified: Secondary | ICD-10-CM | POA: Diagnosis present

## 2022-04-08 DIAGNOSIS — F32A Depression, unspecified: Secondary | ICD-10-CM | POA: Diagnosis present

## 2022-04-08 DIAGNOSIS — M272 Inflammatory conditions of jaws: Secondary | ICD-10-CM | POA: Diagnosis present

## 2022-04-08 DIAGNOSIS — E118 Type 2 diabetes mellitus with unspecified complications: Secondary | ICD-10-CM | POA: Diagnosis present

## 2022-04-08 DIAGNOSIS — K219 Gastro-esophageal reflux disease without esophagitis: Secondary | ICD-10-CM | POA: Diagnosis not present

## 2022-04-08 DIAGNOSIS — F419 Anxiety disorder, unspecified: Secondary | ICD-10-CM | POA: Diagnosis present

## 2022-04-08 DIAGNOSIS — D631 Anemia in chronic kidney disease: Secondary | ICD-10-CM | POA: Diagnosis present

## 2022-04-08 DIAGNOSIS — E538 Deficiency of other specified B group vitamins: Secondary | ICD-10-CM | POA: Diagnosis not present

## 2022-04-08 DIAGNOSIS — M109 Gout, unspecified: Secondary | ICD-10-CM | POA: Diagnosis present

## 2022-04-08 DIAGNOSIS — R091 Pleurisy: Secondary | ICD-10-CM

## 2022-04-08 DIAGNOSIS — Z885 Allergy status to narcotic agent status: Secondary | ICD-10-CM

## 2022-04-08 DIAGNOSIS — Z8249 Family history of ischemic heart disease and other diseases of the circulatory system: Secondary | ICD-10-CM

## 2022-04-08 DIAGNOSIS — R0781 Pleurodynia: Secondary | ICD-10-CM | POA: Diagnosis not present

## 2022-04-08 DIAGNOSIS — Z79891 Long term (current) use of opiate analgesic: Secondary | ICD-10-CM

## 2022-04-08 DIAGNOSIS — Z841 Family history of disorders of kidney and ureter: Secondary | ICD-10-CM

## 2022-04-08 DIAGNOSIS — Z888 Allergy status to other drugs, medicaments and biological substances status: Secondary | ICD-10-CM

## 2022-04-08 DIAGNOSIS — I5022 Chronic systolic (congestive) heart failure: Secondary | ICD-10-CM | POA: Diagnosis present

## 2022-04-08 DIAGNOSIS — J189 Pneumonia, unspecified organism: Secondary | ICD-10-CM | POA: Diagnosis not present

## 2022-04-08 DIAGNOSIS — R079 Chest pain, unspecified: Secondary | ICD-10-CM | POA: Diagnosis not present

## 2022-04-08 DIAGNOSIS — Z515 Encounter for palliative care: Secondary | ICD-10-CM | POA: Diagnosis not present

## 2022-04-08 DIAGNOSIS — Z9221 Personal history of antineoplastic chemotherapy: Secondary | ICD-10-CM

## 2022-04-08 LAB — PROCALCITONIN: Procalcitonin: <0.1 ng/mL

## 2022-04-08 LAB — CBC
HCT: 31.3 % — ABNORMAL LOW (ref 36.0–46.0)
Hemoglobin: 10.1 g/dL — ABNORMAL LOW (ref 12.0–15.0)
MCH: 24.9 pg — ABNORMAL LOW (ref 26.0–34.0)
MCHC: 32.3 g/dL (ref 30.0–36.0)
MCV: 77.3 fL — ABNORMAL LOW (ref 80.0–100.0)
Platelets: 135 10*3/uL — ABNORMAL LOW (ref 150–400)
RBC: 4.05 MIL/uL (ref 3.87–5.11)
RDW: 15.7 % — ABNORMAL HIGH (ref 11.5–15.5)
WBC: 9.5 10*3/uL (ref 4.0–10.5)
nRBC: 0 % (ref 0.0–0.2)

## 2022-04-08 LAB — BASIC METABOLIC PANEL
Anion gap: 7 (ref 5–15)
BUN: 30 mg/dL — ABNORMAL HIGH (ref 8–23)
CO2: 23 mmol/L (ref 22–32)
Calcium: 9.3 mg/dL (ref 8.9–10.3)
Chloride: 107 mmol/L (ref 98–111)
Creatinine, Ser: 1.27 mg/dL — ABNORMAL HIGH (ref 0.44–1.00)
GFR, Estimated: 47 mL/min — ABNORMAL LOW (ref >60)
Glucose, Bld: 185 mg/dL — ABNORMAL HIGH (ref 70–99)
Potassium: 4.5 mmol/L (ref 3.5–5.1)
Sodium: 137 mmol/L (ref 135–145)

## 2022-04-08 LAB — RESP PANEL BY RT-PCR (FLU A&B, COVID) ARPGX2
Influenza A by PCR: NEGATIVE
Influenza B by PCR: NEGATIVE
SARS Coronavirus 2 by RT PCR: NEGATIVE

## 2022-04-08 LAB — POTASSIUM: Potassium: 4 mmol/L (ref 3.5–5.1)

## 2022-04-08 LAB — MAGNESIUM: Magnesium: 1 mg/dL — ABNORMAL LOW (ref 1.7–2.4)

## 2022-04-08 LAB — LACTIC ACID, PLASMA: Lactic Acid, Venous: 2.3 mmol/L (ref 0.5–1.9)

## 2022-04-08 LAB — TROPONIN I (HIGH SENSITIVITY)
Troponin I (High Sensitivity): 5 ng/L (ref <18)
Troponin I (High Sensitivity): 7 ng/L (ref <18)

## 2022-04-08 MED ORDER — SODIUM CHLORIDE 0.9 % IV SOLN
200.0000 mg | Freq: Once | INTRAVENOUS | Status: AC
  Administered 2022-04-08: 200 mg via INTRAVENOUS
  Filled 2022-04-08: qty 200

## 2022-04-08 MED ORDER — HEPARIN SOD (PORK) LOCK FLUSH 100 UNIT/ML IV SOLN
500.0000 [IU] | Freq: Once | INTRAVENOUS | Status: DC | PRN
  Filled 2022-04-08: qty 5

## 2022-04-08 MED ORDER — FUROSEMIDE 40 MG PO TABS
40.0000 mg | ORAL_TABLET | Freq: Every day | ORAL | Status: DC
  Administered 2022-04-09 – 2022-04-10 (×2): 40 mg via ORAL
  Filled 2022-04-08 (×2): qty 1

## 2022-04-08 MED ORDER — DOXYCYCLINE HYCLATE 100 MG PO TABS
100.0000 mg | ORAL_TABLET | Freq: Two times a day (BID) | ORAL | Status: DC
  Administered 2022-04-08 – 2022-04-10 (×4): 100 mg via ORAL
  Filled 2022-04-08 (×4): qty 1

## 2022-04-08 MED ORDER — ACYCLOVIR 400 MG PO TABS
400.0000 mg | ORAL_TABLET | Freq: Two times a day (BID) | ORAL | Status: DC
  Filled 2022-04-08: qty 1

## 2022-04-08 MED ORDER — HYDROMORPHONE HCL 1 MG/ML IJ SOLN
INTRAMUSCULAR | Status: AC
  Administered 2022-04-08: 0.5 mg via INTRAVENOUS
  Filled 2022-04-08: qty 0.5

## 2022-04-08 MED ORDER — ENOXAPARIN SODIUM 40 MG/0.4ML IJ SOSY
40.0000 mg | PREFILLED_SYRINGE | INTRAMUSCULAR | Status: DC
  Administered 2022-04-08 – 2022-04-09 (×2): 40 mg via SUBCUTANEOUS
  Filled 2022-04-08 (×2): qty 0.4

## 2022-04-08 MED ORDER — INSULIN ASPART 100 UNIT/ML IJ SOLN
0.0000 [IU] | Freq: Three times a day (TID) | INTRAMUSCULAR | Status: DC
  Administered 2022-04-09 (×2): 2 [IU] via SUBCUTANEOUS
  Filled 2022-04-08 (×2): qty 1

## 2022-04-08 MED ORDER — DOXYCYCLINE HYCLATE 50 MG PO CAPS: 50.0000 mg | ORAL_CAPSULE | Freq: Two times a day (BID) | ORAL | Status: DC

## 2022-04-08 MED ORDER — POLYETHYLENE GLYCOL 3350 17 G PO PACK: 17.0000 g | PACK | Freq: Every day | ORAL | Status: DC | PRN

## 2022-04-08 MED ORDER — HYDROMORPHONE HCL 1 MG/ML IJ SOLN
1.0000 mg | INTRAMUSCULAR | Status: DC | PRN
  Administered 2022-04-08 – 2022-04-09 (×7): 1 mg via INTRAVENOUS
  Filled 2022-04-08 (×7): qty 1

## 2022-04-08 MED ORDER — SODIUM CHLORIDE 0.9% FLUSH
10.0000 mL | Freq: Once | INTRAVENOUS | Status: AC | PRN
  Administered 2022-04-08: 10 mL
  Filled 2022-04-08: qty 10

## 2022-04-08 MED ORDER — MAGNESIUM SULFATE 2 GM/50ML IV SOLN
2.0000 g | Freq: Once | INTRAVENOUS | Status: AC
  Administered 2022-04-08: 2 g via INTRAVENOUS
  Filled 2022-04-08: qty 50

## 2022-04-08 MED ORDER — SODIUM CHLORIDE 0.9 % IV SOLN
25.0000 mg | Freq: Once | INTRAVENOUS | Status: AC
  Administered 2022-04-08: 25 mg via INTRAVENOUS
  Filled 2022-04-08: qty 1

## 2022-04-08 MED ORDER — SODIUM CHLORIDE 0.9 % IV SOLN
Freq: Once | INTRAVENOUS | Status: AC
  Filled 2022-04-08: qty 250

## 2022-04-08 MED ORDER — PROMETHAZINE HCL 25 MG/ML IJ SOLN
6.2500 mg | Freq: Once | INTRAMUSCULAR | Status: AC
  Administered 2022-04-08: 6.25 mg via INTRAMUSCULAR
  Filled 2022-04-08: qty 1

## 2022-04-08 MED ORDER — AZITHROMYCIN 500 MG PO TABS
500.0000 mg | ORAL_TABLET | Freq: Once | ORAL | Status: AC
  Administered 2022-04-08: 500 mg via ORAL
  Filled 2022-04-08: qty 1

## 2022-04-08 MED ORDER — PANTOPRAZOLE SODIUM 40 MG PO TBEC
40.0000 mg | DELAYED_RELEASE_TABLET | Freq: Every day | ORAL | Status: DC
  Administered 2022-04-09 – 2022-04-10 (×2): 40 mg via ORAL
  Filled 2022-04-08 (×2): qty 1

## 2022-04-08 MED ORDER — ACYCLOVIR 200 MG PO CAPS
400.0000 mg | ORAL_CAPSULE | Freq: Two times a day (BID) | ORAL | Status: DC
  Administered 2022-04-08 – 2022-04-10 (×4): 400 mg via ORAL
  Filled 2022-04-08 (×4): qty 2

## 2022-04-08 MED ORDER — HYDROMORPHONE HCL 1 MG/ML IJ SOLN
0.5000 mg | Freq: Once | INTRAMUSCULAR | Status: AC
  Administered 2022-04-08: 0.5 mg via INTRAVENOUS
  Filled 2022-04-08: qty 0.5

## 2022-04-08 MED ORDER — SODIUM CHLORIDE 0.9 % IV SOLN
2.0000 g | INTRAVENOUS | Status: DC
  Administered 2022-04-08 – 2022-04-09 (×2): 2 g via INTRAVENOUS
  Filled 2022-04-08 (×2): qty 20

## 2022-04-08 MED ORDER — SACUBITRIL-VALSARTAN 24-26 MG PO TABS
1.0000 | ORAL_TABLET | Freq: Two times a day (BID) | ORAL | Status: DC
  Administered 2022-04-08 – 2022-04-10 (×4): 1 via ORAL
  Filled 2022-04-08 (×4): qty 1

## 2022-04-08 MED ORDER — BISOPROLOL FUMARATE 5 MG PO TABS
5.0000 mg | ORAL_TABLET | Freq: Every day | ORAL | Status: DC
  Administered 2022-04-08 – 2022-04-09 (×2): 5 mg via ORAL
  Filled 2022-04-08 (×4): qty 1

## 2022-04-08 MED ORDER — AZITHROMYCIN 500 MG PO TABS: 250.0000 mg | ORAL_TABLET | Freq: Every day | ORAL | Status: DC

## 2022-04-08 MED ORDER — FLUOXETINE HCL 20 MG PO CAPS
40.0000 mg | ORAL_CAPSULE | Freq: Every day | ORAL | Status: DC
  Administered 2022-04-09 – 2022-04-10 (×2): 40 mg via ORAL
  Filled 2022-04-08 (×2): qty 2

## 2022-04-08 MED ORDER — DULOXETINE HCL 30 MG PO CPEP
60.0000 mg | ORAL_CAPSULE | Freq: Every day | ORAL | Status: DC
  Administered 2022-04-08 – 2022-04-10 (×3): 60 mg via ORAL
  Filled 2022-04-08 (×2): qty 1
  Filled 2022-04-08: qty 2

## 2022-04-08 MED ORDER — SODIUM CHLORIDE 0.9% FLUSH
3.0000 mL | Freq: Two times a day (BID) | INTRAVENOUS | Status: DC
  Administered 2022-04-08 – 2022-04-10 (×4): 3 mL via INTRAVENOUS

## 2022-04-08 MED ORDER — IOHEXOL 350 MG/ML SOLN
75.0000 mL | Freq: Once | INTRAVENOUS | Status: AC | PRN
  Administered 2022-04-08: 75 mL via INTRAVENOUS

## 2022-04-08 MED ORDER — MAGNESIUM SULFATE 4 GM/100ML IV SOLN: 4.0000 g | Freq: Once | INTRAVENOUS | Status: DC

## 2022-04-08 MED ORDER — NITROGLYCERIN 0.4 MG SL SUBL
0.4000 mg | SUBLINGUAL_TABLET | SUBLINGUAL | Status: DC | PRN
  Administered 2022-04-08 (×3): 0.4 mg via SUBLINGUAL
  Filled 2022-04-08 (×2): qty 1

## 2022-04-08 MED ORDER — ATORVASTATIN CALCIUM 10 MG PO TABS
5.0000 mg | ORAL_TABLET | Freq: Every day | ORAL | Status: DC
  Administered 2022-04-08 – 2022-04-10 (×3): 5 mg via ORAL
  Filled 2022-04-08 (×3): qty 0.5

## 2022-04-08 MED ORDER — PENTOXIFYLLINE ER 400 MG PO TBCR
400.0000 mg | EXTENDED_RELEASE_TABLET | Freq: Three times a day (TID) | ORAL | Status: DC
  Administered 2022-04-08 – 2022-04-10 (×5): 400 mg via ORAL
  Filled 2022-04-08 (×6): qty 1

## 2022-04-08 MED ORDER — HYDROMORPHONE HCL 1 MG/ML IJ SOLN: 0.5000 mg | Freq: Once | INTRAMUSCULAR | Status: AC

## 2022-04-08 MED ORDER — HYDROXYZINE HCL 10 MG PO TABS
10.0000 mg | ORAL_TABLET | Freq: Once | ORAL | Status: AC
  Administered 2022-04-08: 10 mg via ORAL
  Filled 2022-04-08: qty 1

## 2022-04-08 NOTE — Progress Notes (Signed)
Pain in left side of chest, feels like pleurisy ? Coughing up yellow phlegm. Hurts to breathe. Pain is 7/10 on arrival. Pt received IV Venofer and IV phenergan was infusing when pt began moving around in chair, unable to get comfortable. Pain increasing in chest 10/10. Beginning to feel nauseated. Pt was transferred to ED via wheelchair.

## 2022-04-08 NOTE — Progress Notes (Signed)
Symptom Management [place]  [phone] [phone]) [phone]  Patient Care Team: Glean Hess, MD as PCP - General (Internal Medicine) Jodell Cipro, MD as Referring Physician (Pain Medicine) Jennye Boroughs, MD as Consulting Physician (Cardiology) Earlie Server, MD as Consulting Physician (Oncology)   Name of the patient: Sarah [name]  [id number]  [birth year]   Date of visit: 04/08/22  Reason for Consult:  Sarah [name] is a 66 year old woman with multiple medical problems including multiple myeloma status post bone marrow transplant.   Dara on hold due to frequent infections previously hospitalized in February 2023 for pneumonia complicated by non-STEMI.  [facility] recommended holding myeloma treatments with plan for as needed IVIG.  Patient has had chronic hypomagnesemia requiring weekly magnesium infusion.  Patient was seen on 03/26/2022 in the ED for shortness of breath and hypoxia.  She has history of CHF with EF of 35 to 40% with suspected mild CHF exacerbation.  Patient was given dose of IV Lasix and discharged home with plan to follow-up with her outpatient cardiologist.  Patient was not on my schedule today for evaluation of pleuritic chest pain.  Patient reports that she started having a productive cough over the weekend, which led to progressively worse left-sided pain with deep inspiration/coughing.  She denies shortness of breath and reports that she has been monitoring her SPO2 at home ranging in the mid to high 90s.  She denies chest pressure.  No fever or chills.  She denies changes in weight or lower extremity edema.  Denies any neurologic complaints.  Denies any easy bleeding or bruising. Reports good appetite and denies weight loss. Denies chest pain.  Denies urinary complaints. Patient offers no further specific complaints today.  PAST MEDICAL HISTORY: Past Medical History:  Diagnosis Date   Anemia    Anxiety     Aortic stenosis    a. 05/2020 Echo: EF 45-50%, sev AS - seen by TAVR team @ [facility] - CTA sugg of tricuspid valve w/ fusing of 2 leaflets-TAVR deferred in setting of acute infxn; b. 07/2020 Echo: EF 50-55%, mod-sev AS; c. 12/2020 Echo: EF>55%. Mod-sev paradoxical low-flow low-gradient AS; d. 08/2021 Echo: EF 35-40%, severe AS, triv AI, mild MR.   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 Variable EF over time; b. 08/2017 Echo: EF 40%; b. 03/2020 Echo: EF 55-60%; c. 05/2020 Echo: EF 45-50%; d. 07/2020 Echo: EF 50-55%; e. 12/2020 Echo: EF>55%; f. 08/2021 Echo: EF 35-40%.   Chronic heart failure with preserved ejection fraction (HFpEF) ([place])    a. Variable EF over time; b. 08/2017 Echo: EF 40%; b. 03/2020 Echo: EF 55-60%; c. 05/2020 Echo: EF 45-50%; d. 07/2020 Echo: EF 50-55%; e. 12/2020 Echo: EF>55%; f. 08/2021 Echo: EF 35-40%, no RV, mildly dil LA, mild MR, triv AI, severe AS.   CKD (chronic kidney disease) stage 3, GFR 30-59 ml/min (HCC)    Depression    Diabetes mellitus (HCC)    Dizziness    Fatty liver    Frequent falls    GERD (gastroesophageal reflux disease)    Gout    Heart murmur    History of blood transfusion    History of bone marrow transplant ([place])    History of uterine fibroid    Hx of cardiac catheterization    a. 01/2016 Cath [facility] - after abnl nuc):  Nl cors.   Hypertension    Hypomagnesemia    IDA (iron deficiency anemia)    Multiple myeloma (HCC)    Personal history of chemotherapy    PSVT (paroxysmal supraventricular tachycardia) ([place])    a. S/p [facility]).   PVC's (premature ventricular contractions)    a. Well-managed w/ bisoprolol in outpt setting.   Renal cyst     PAST SURGICAL HISTORY:  Past Surgical History:  Procedure Laterality Date   ABDOMINAL HYSTERECTOMY     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   CARDIAC ELECTROPHYSIOLOGY MAPPING AND  ABLATION     CARPAL TUNNEL RELEASE Bilateral    CHOLECYSTECTOMY  2008   COLONOSCOPY WITH PROPOFOL N/A 05/07/2017   Procedure: COLONOSCOPY WITH PROPOFOL;  Surgeon: [name], MD;  Location: [facility] ENDOSCOPY;  Service: Gastroenterology;  Laterality: N/A;   COLONOSCOPY WITH PROPOFOL N/A 12/19/2020   Procedure: COLONOSCOPY WITH PROPOFOL;  Surgeon: [name], MD;  Location: [facility] ENDOSCOPY;  Service: Gastroenterology;  Laterality: N/A;   ESOPHAGOGASTRODUODENOSCOPY (EGD) WITH PROPOFOL N/A 05/07/2017   Procedure: ESOPHAGOGASTRODUODENOSCOPY (EGD) WITH PROPOFOL;  Surgeon: [name], MD;  Location: [facility] ENDOSCOPY;  Service: Gastroenterology;  Laterality: N/A;   FOOT SURGERY Bilateral    INCONTINENCE SURGERY  2009   INTERSTIM IMPLANT PLACEMENT     other     over active bladder   OTHER SURGICAL HISTORY     bladder stimulator    PARTIAL HYSTERECTOMY  03/1996   fibroids   PORTA CATH INSERTION N/A 03/10/2019   Procedure: PORTA CATH INSERTION;  Surgeon: Algernon Huxley, MD;  Location: [place] CV LAB;  Service: Cardiovascular;  Laterality: N/A;   TONSILLECTOMY  2007    HEMATOLOGY/ONCOLOGY HISTORY:  Oncology History Overview Note  # June 2017- [facility] MULTIPLE MYELOMA [BMBx- 80% plasma cells; [facility]]; Lamda light chain 1340; s/p RVD   # 14th DEC 2016- Auto-Stem cell transplant [[facility];Dr.Hertzig]; rev [dose reduced sec to [place]  # Maintenance Revlimid- 75m 3 w-ON & 1 week OFF; HELD July 2018- sec to ONJ  # Bone lesions-numerous ~551mlytic lesions in Thoracic spine- on Zometa  # 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  # "Colitis" s/p multiple EGD/Colonoscopies; EGD- patchy inflammation/colo- Nov 2018 [Dr.Anna]  DIAGNOSIS: Multiple myeloma  GOALS: Control/palliative  CURRENT/MOST RECENT THERAPY  -surveillance    Multiple [place]oma not having achieved remission ([place] 09/27/2019 - 04/10/2020 Chemotherapy         08/31/2020 - 05/14/2021 Chemotherapy   Patient is on Treatment Plan : MYELOMA MAINTENANCE Bortezomib SQ q14d     05/30/2021 - 05/30/2021 Chemotherapy   Patient is on Treatment Plan : MYELOMA NEWLY DIAGNOSED TRANSPLANT CANDIDATE Daratumumab SQ + Lenalidomide q28d (Maintenance)     05/30/2021 -  Chemotherapy   Patient is on Treatment Plan : MYELOMA NEWLY DIAGNOSED TRANSPLANT CANDIDATE Daratumumab SQ + Lenalidomide q28d (Maintenance)     06/06/2021 - 10/29/2021 Chemotherapy   Patient is on Treatment Plan : MYELOMA POST  TRANSPLANT Daratumumab SQ (Maintenance)     Multiple [place] in remission ([place] 05/14/2021 Initial Diagnosis   Multiple [place]oma in remission ([place]  05/30/2021 - 05/30/2021 Chemotherapy   Patient is on Treatment Plan : MYELOMA NEWLY DIAGNOSED TRANSPLANT CANDIDATE Daratumumab SQ + Lenalidomide q28d (Maintenance)     05/30/2021 -  Chemotherapy  Patient is on Treatment Plan : MYELOMA NEWLY DIAGNOSED TRANSPLANT CANDIDATE Daratumumab SQ + Lenalidomide q28d (Maintenance)     06/06/2021 - 10/29/2021 Chemotherapy   Patient is on Treatment Plan : MYELOMA POST  TRANSPLANT Daratumumab SQ (Maintenance)       ALLERGIES:  is allergic to oxycodone-acetaminophen, celebrex [celecoxib], codeine, plerixafor, benadryl [diphenhydramine], morphine, ondansetron, and tylenol [acetaminophen].  MEDICATIONS:  Current Outpatient Medications  Medication Sig Dispense Refill   acyclovir (ZOVIRAX) 400 MG tablet Take 1 tablet (400 mg total) by mouth 2 (two) times daily. 180 tablet 1   allopurinol (ZYLOPRIM) 100 MG tablet TAKE 1 TABLET(100 MG) BY MOUTH DAILY as needed 90 tablet 1   atorvastatin (LIPITOR) 10 MG tablet Take 0.5 tablets (5 mg total) by mouth daily. 15 tablet 11   bisoprolol (ZEBETA) 5 MG tablet Take 1 tablet (5 mg total) by mouth daily. 30 tablet 2   clotrimazole  (MYCELEX) 10 MG troche Take 10 mg by mouth 3 (three) times daily as needed.     diclofenac sodium (VOLTAREN) 1 % GEL Apply 2 g topically 4 (four) times daily. 100 g 1   diphenoxylate-atropine (LOMOTIL) 2.5-0.025 MG tablet Take 2 tablets by mouth 4 (four) times daily as needed for diarrhea or loose stools. 64 tablet 3   doxycycline (VIBRAMYCIN) 50 MG capsule Take 50 mg by mouth 2 (two) times daily.     DULoxetine (CYMBALTA) 60 MG capsule TAKE 1 CAPSULE(60 MG) BY MOUTH DAILY 90 capsule 0   ENTRESTO 24-26 MG Take 1 tablet by mouth 2 (two) times daily.     FLUoxetine (PROZAC) 40 MG capsule TAKE 1 CAPSULE EVERY DAY 90 capsule 0   furosemide (LASIX) 20 MG tablet Take 1 tablet (20 mg total) by mouth daily as needed for edema (If > 2 pounds in one day or >5 pounds over several days). (Patient taking differently: Take 40 mg by mouth daily.) 30 tablet 11   glipiZIDE (GLUCOTROL XL) 2.5 MG 24 hr tablet Take 1 tablet (2.5 mg total) by mouth daily with breakfast. 90 tablet 1   glucose blood test strip      guaiFENesin-dextromethorphan (ROBITUSSIN DM) 100-10 MG/5ML syrup Take 5 mLs by mouth every 4 (four) hours as needed for cough. 118 mL 0   montelukast (SINGULAIR) 10 MG tablet Take 1 tablet (10 mg total) by mouth See admin instructions. Take 1 tab daily for 2 days after daratumumab treatments 30 tablet 1   Multiple Vitamin (MULTIVITAMIN) tablet Take 1 tablet by mouth daily.     nitroGLYCERIN (NITROSTAT) 0.4 MG SL tablet Place 1 tablet (0.4 mg total) under the tongue every 5 (five) minutes as needed for chest pain. (Patient not taking: Reported on 03/25/2022) 20 tablet 12   omeprazole (PRILOSEC) 40 MG capsule TAKE 1 CAPSULE (40 MG TOTAL) BY MOUTH IN THE MORNING AND AT BEDTIME. 180 capsule 0   pentoxifylline (TRENTAL) 400 MG CR tablet Take 400 mg by mouth 3 (three) times daily.     promethazine (PHENERGAN) 25 MG tablet Take 1 tablet (25 mg total) by mouth every 6 (six) hours as needed for nausea or vomiting. 90  tablet 1   tiZANidine (ZANAFLEX) 4 MG tablet Take 1 tablet (4 mg total) by mouth every 6 (six) hours as needed for muscle spasms. 90 tablet 0   traMADol (ULTRAM) 50 MG tablet Take 50 mg by mouth 2 (two) times daily.     traZODone (DESYREL) 100 MG tablet TAKE 1 TABLET AT BEDTIME 90 tablet 0  No current facility-administered medications for this visit.   Facility-Administered Medications Ordered in Other Visits  Medication Dose Route Frequency Provider Last Rate Last Admin   heparin lock flush 100 unit/mL  500 Units Intracatheter Once PRN Earlie Server, MD       iron sucrose (VENOFER) 200 mg in sodium chloride 0.9 % 100 mL IVPB  200 mg Intravenous Once Earlie Server, MD 440 mL/hr at 04/08/22 0843 200 mg at 04/08/22 0843   promethazine (PHENERGAN) 25 mg in sodium chloride 0.9 % 50 mL IVPB  25 mg Intravenous Once Earlie Server, MD       sodium chloride flush (NS) 0.9 % injection 10 mL  10 mL Intravenous PRN Earlie Server, MD   10 mL at 04/02/21 0904   sodium chloride flush (NS) 0.9 % injection 10 mL  10 mL Intracatheter Once PRN Earlie Server, MD        VITAL SIGNS: BP 123/74 (BP Location: Left Arm, Patient Position: Sitting)   Pulse 73   Temp 98.6 F (37 C) (Tympanic)   Resp 18  There were no vitals filed for this visit.  Estimated body mass index is 28.35 kg/m as calculated from the following:   Height as of 03/25/22: 5' 2"  (1.575 m).   Weight as of 03/25/22: 155 lb (70.3 kg).  LABS: CBC:    Component Value Date/Time   WBC 7.1 03/25/2022 2129   HGB 10.3 (L) 03/25/2022 2129   HCT 32.9 (L) 03/25/2022 2129   PLT 145 (L) 03/25/2022 2129   MCV 78.3 (L) 03/25/2022 2129   NEUTROABS 4.9 03/25/2022 2129   LYMPHSABS 1.5 03/25/2022 2129   MONOABS 0.4 03/25/2022 2129   EOSABS 0.2 03/25/2022 2129   BASOSABS 0.0 03/25/2022 2129   Comprehensive Metabolic Panel:    Component Value Date/Time   NA 135 04/03/2022 0818   NA 141 04/19/2016 1604   K 4.0 04/08/2022 0821   CL 105 04/03/2022 0818   CO2 24 04/03/2022  0818   BUN 46 (H) 04/03/2022 0818   BUN 22 04/19/2016 1604   CREATININE 1.46 (H) 04/03/2022 0818   GLUCOSE 154 (H) 04/03/2022 0818   CALCIUM 8.1 (L) 04/03/2022 0818   CALCIUM 8.8 03/23/2020 1144   AST 39 03/25/2022 2129   ALT 32 03/25/2022 2129   ALKPHOS 106 03/25/2022 2129   BILITOT 0.5 03/25/2022 2129   PROT 6.6 03/25/2022 2129   ALBUMIN 3.7 03/25/2022 2129    RADIOGRAPHIC STUDIES: DG Chest 2 View  Result Date: 03/25/2022 CLINICAL DATA:  Dyspnea EXAM: CHEST - 2 VIEW COMPARISON:  None Available. FINDINGS: The lungs are symmetrically well expanded. No pneumothorax or pleural effusion. There is mild perihilar and lower lung zone interstitial thickening most in keeping with trace interstitial pulmonary edema. Right internal jugular chest port tip is seen at the superior cavoatrial junction. Cardiac size within normal limits. Osseous structures are age-appropriate. No acute bone abnormality. IMPRESSION: Trace interstitial pulmonary edema. Electronically Signed   By: Fidela Salisbury M.D.   On: 03/25/2022 22:00    PERFORMANCE STATUS (ECOG) : 1 - Symptomatic but completely ambulatory  Review of Systems Unless otherwise noted, a complete review of systems is negative.  Physical Exam General: NAD Cardiovascular: regular rate and rhythm Pulmonary: clear anterior/posterior fields Abdomen: soft, nontender, + bowel sounds GU: no suprapubic tenderness Extremities: no edema, no joint deformities Skin: no rashes Neurological: Nonfocal  Assessment and Plan:  Cough/pleuritic chest pain  -discussed with Dr. Tasia Catchings.  Patient high risk of recurrent infection/pneumonia  and will need further work-up.  Dr. Tasia Catchings recommended that patient be transferred to the ED for further evaluation and management.  Patient was in agreement with this plan.  Of note, patient has been on doxycycline for over a month in anticipation of a jaw surgery later in October.    Hypomagnesemia -will require IV repletion as patient was  transferred to the ED prior to this being completed in clinic.  Patient would likely benefit from discussion with cardiology as loop diuretics are probably exacerbating renal magnesium loss.  Case and plan discussed with Dr. Tasia Catchings.  Report called to ED triage nurse  Patient expressed understanding and was in agreement with this plan. She also understands that She can call clinic at any time with any questions, concerns, or complaints.   Thank you for allowing me to participate in the care of this very pleasant patient.   Time Total: 20 minutes  Visit consisted of counseling and education dealing with the complex and emotionally intense issues of symptom management and palliative care in the setting of serious and potentially life-threatening illness.Greater than 50%  of this time was spent counseling and coordinating care related to the above assessment and plan.  Signed by: Altha Harm, PhD, NP-C

## 2022-04-08 NOTE — Assessment & Plan Note (Addendum)
Sarah [name] is presenting with 3-day history of productive cough, mild shortness of breath, and pleuritic chest pain with chest x-ray evidence of linear densities consistent with atelectasis versus pneumonia and CTA with evidence of left pleural effusion in the corresponding region.  Given symptoms and elevated white blood count, most likely diagnosis is community-acquired acquired pneumonia complicated by pleuritis.  Procalcitonin is negative, however due to high risk given prior immunosuppression, will cover bacterial etiologies.  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  - Admit for observation - Start Rocephin - Increase dose of doxycycline to 100 mg twice daily - Dilaudid 1 mg IV every 8 to hours for pleuritis - Legionella and strep pneumo urinary antigens pending - RVP pending

## 2022-04-08 NOTE — ED Triage Notes (Addendum)
Patient sent by cancer center for chest pains. Reports she has been having them all weekend. Patient states she had iron transfusion before being sent to ER. HX multiple myeloma and has had bone marrow transplants X2.   Cancer center reports she needs her magnesium replenished.   Right chest port accessed upon arrival by cancer center

## 2022-04-08 NOTE — Assessment & Plan Note (Signed)
Patient currently following with Dr. Tasia Catchings, oncology.  She has been unable to achieve remission due to frequent infections despite IVIG while on immunotherapy.  We will continue her home prophylaxis therapy.  -Restart home Acyclovir

## 2022-04-08 NOTE — Progress Notes (Signed)
New pain in chest /rib area on left side. Pt states it feels like pleurisy. It hurts to breathe. Coughing up yellow phlegm. Afebrile.

## 2022-04-08 NOTE — H&P (Addendum)
History and Physical    Patient: Sarah [name] ZOX:[id number] DOB: 1955/11/19 DOA: 04/08/2022 DOS: the patient was seen and examined on 04/08/2022 PCP: Glean Hess, MD  Patient coming from: Home  Chief Complaint:  Chief Complaint  Patient presents with   Chest Pain   HPI: Sarah [name] is a 66 y.o. female with medical history significant of multiple myeloma s/p bone marrow transplant, HFrEF with recovered EF, PVCs, CKD stage IIIa, type 2 diabetes, who presents to the ED with complaints of chest pain.  Patient states on 03/26/2022, she presented to the ED with shortness of breath and chest tightness.  She was found to be volume overloaded and treated with IV Lasix at which time she was discharged home.  She states she felt better after this visit and had followed up with her nephrologist and oncologist.  Then on Friday, 10/06, she developed productive cough with mild shortness of breath.  The symptom that was most unusual for her is severe left-sided chest pain that is exacerbated by turning positions, deep breaths, coughing and even talking.  She states current episode does not feel like her prior CHF exacerbations.  She denies any fever, chills, nasal congestion, rhinorrhea, diarrhea, abdominal pain. She followed up with her Oncologist today who recommended she be evaluated in the ER  ED course: On arrival to the ED, patient was afebrile at 98.6.  She was saturating at 100% on room air.  Blood pressure normotensive at 123/74 with heart rate of 73.  Initial chest x-ray with left lower lung field pneumonia versus atelectasis.  CTA was obtained that demonstrated mild interstitial prominence and scattered groundglass opacities with trace left pleural effusion.  No evidence of PE.  Given high risk for decompensation, [facility] contacted for admission.    Review of Systems: As mentioned in the history of present illness. All other systems reviewed and are negative.  Past Medical History:   Diagnosis Date   Anemia    Anxiety    Aortic stenosis    a. 05/2020 Echo: EF 45-50%, sev AS - seen by TAVR team @ [facility] - CTA sugg of tricuspid valve w/ fusing of 2 leaflets-TAVR deferred in setting of acute infxn; b. 07/2020 Echo: EF 50-55%, mod-sev AS; c. 12/2020 Echo: EF>55%. Mod-sev paradoxical low-flow low-gradient AS; d. 08/2021 Echo: EF 35-40%, severe AS, triv AI, mild MR.   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 Variable EF over time; b. 08/2017 Echo: EF 40%; b. 03/2020 Echo: EF 55-60%; c. 05/2020 Echo: EF 45-50%; d. 07/2020 Echo: EF 50-55%; e. 12/2020 Echo: EF>55%; f. 08/2021 Echo: EF 35-40%.   Chronic heart failure with preserved ejection fraction (HFpEF) ([place])    a. Variable EF over time; b. 08/2017 Echo: EF 40%; b. 03/2020 Echo: EF 55-60%; c. 05/2020 Echo: EF 45-50%; d. 07/2020 Echo: EF 50-55%; e. 12/2020 Echo: EF>55%; f. 08/2021 Echo: EF 35-40%, no RV, mildly dil LA, mild MR, triv AI, severe AS.   CKD (chronic kidney disease) stage 3, GFR 30-59 ml/min (HCC)    Depression    Diabetes mellitus (HCC)    Dizziness    Fatty liver    Frequent falls    GERD (gastroesophageal reflux disease)    Gout    Heart murmur    History of blood transfusion    History of bone marrow transplant ([place])  History of uterine fibroid    Hx of cardiac catheterization    a. 01/2016 Cath [facility] - after abnl nuc): Nl cors.   Hypertension    Hypomagnesemia    IDA (iron deficiency anemia)    Multiple myeloma (HCC)    Personal history of chemotherapy    PSVT (paroxysmal supraventricular tachycardia)    a. S/p [facility]).   PVC's (premature ventricular contractions)    a. Well-managed w/ bisoprolol in outpt setting.   Renal cyst    Past Surgical History:  Procedure Laterality Date   ABDOMINAL HYSTERECTOMY     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   CARDIAC ELECTROPHYSIOLOGY  MAPPING AND ABLATION     CARPAL TUNNEL RELEASE Bilateral    CHOLECYSTECTOMY  2008   COLONOSCOPY WITH PROPOFOL N/A 05/07/2017   Procedure: COLONOSCOPY WITH PROPOFOL;  Surgeon: [name], MD;  Location: [facility] ENDOSCOPY;  Service: Gastroenterology;  Laterality: N/A;   COLONOSCOPY WITH PROPOFOL N/A 12/19/2020   Procedure: COLONOSCOPY WITH PROPOFOL;  Surgeon: [name], MD;  Location: [facility] ENDOSCOPY;  Service: Gastroenterology;  Laterality: N/A;   ESOPHAGOGASTRODUODENOSCOPY (EGD) WITH PROPOFOL N/A 05/07/2017   Procedure: ESOPHAGOGASTRODUODENOSCOPY (EGD) WITH PROPOFOL;  Surgeon: [name], MD;  Location: [facility] ENDOSCOPY;  Service: Gastroenterology;  Laterality: N/A;   FOOT SURGERY Bilateral    INCONTINENCE SURGERY  2009   INTERSTIM IMPLANT PLACEMENT     other     over active bladder   OTHER SURGICAL HISTORY     bladder stimulator    PARTIAL HYSTERECTOMY  03/1996   fibroids   PORTA CATH INSERTION N/A 03/10/2019   Procedure: PORTA CATH INSERTION;  Surgeon: Algernon Huxley, MD;  Location: [place] CV LAB;  Service: Cardiovascular;  Laterality: N/A;   TONSILLECTOMY  2007   Social History:  reports that she quit smoking about 30 years ago. Her smoking use included cigarettes. She has a 20.00 pack-year smoking history. She has never used smokeless tobacco. She reports current alcohol use of about 2.0 standard drinks of alcohol per week. She reports that she does not use drugs.  Allergies  Allergen Reactions   Oxycodone-Acetaminophen Anaphylaxis    Swelling and rash   Celebrex [Celecoxib] Diarrhea   Codeine    Plerixafor     In 2016 during ASCT collection patient developed fever to 103.47F and required hospitalization   Benadryl [Diphenhydramine] Palpitations   Morphine Itching and Rash   Ondansetron Diarrhea   Tylenol [Acetaminophen] Itching and Rash    Family History  Problem Relation Age of Onset   Colon cancer Father    Renal Disease Father    Diabetes Mellitus II Father    Melanoma  Paternal Grandmother    Breast cancer Maternal Aunt 67   Anemia Mother    Heart disease Mother    Heart failure Mother    Renal Disease Mother    Congestive Heart Failure Mother    Heart disease Maternal Uncle    Throat cancer Maternal Uncle    Lung cancer Maternal Uncle    Liver disease Maternal Uncle    Heart failure Maternal Uncle    Hearing loss Son 74       Suicide     Prior to Admission medications   Medication Sig Start Date End Date Taking? Authorizing Provider  acyclovir (ZOVIRAX) 400 MG tablet Take 1 tablet (400 mg total) by mouth 2 (two) times daily. 03/28/21  Yes Earlie Server, MD  atorvastatin (LIPITOR) 10 MG tablet Take 0.5 tablets (5 mg  total) by mouth daily. 02/05/22 02/05/23 Yes Annita Brod, MD  bisoprolol (ZEBETA) 5 MG tablet Take 1 tablet (5 mg total) by mouth daily. 02/05/22  Yes Annita Brod, MD  clotrimazole (MYCELEX) 10 MG troche Take 10 mg by mouth 3 (three) times daily as needed. 02/12/22  Yes [provider]  doxycycline (VIBRAMYCIN) 50 MG capsule Take 50 mg by mouth 2 (two) times daily. 02/12/22  Yes [provider]  DULoxetine (CYMBALTA) 60 MG capsule TAKE 1 CAPSULE(60 MG) BY MOUTH DAILY 03/15/22  Yes Glean Hess, MD  ENTRESTO 24-26 MG Take 1 tablet by mouth 2 (two) times daily. 03/15/22  Yes [provider]  FLUoxetine (PROZAC) 40 MG capsule TAKE 1 CAPSULE EVERY DAY 11/06/21  Yes Glean Hess, MD  furosemide (LASIX) 20 MG tablet Take 1 tablet (20 mg total) by mouth daily as needed for edema (If > 2 pounds in one day or >5 pounds over several days). Patient taking differently: Take 40 mg by mouth daily. 02/05/22 02/05/23 Yes Annita Brod, MD  glipiZIDE (GLUCOTROL XL) 2.5 MG 24 hr tablet Take 1 tablet (2.5 mg total) by mouth daily with breakfast. 09/20/21  Yes Glean Hess, MD  Multiple Vitamin (MULTIVITAMIN) tablet Take 1 tablet by mouth daily.   Yes [provider]  omeprazole (PRILOSEC) 40 MG capsule TAKE 1  CAPSULE (40 MG TOTAL) BY MOUTH IN THE MORNING AND AT BEDTIME. 01/03/22  Yes Glean Hess, MD  pentoxifylline (TRENTAL) 400 MG CR tablet Take 400 mg by mouth 3 (three) times daily. 02/16/22  Yes [provider]  tiZANidine (ZANAFLEX) 4 MG tablet Take 1 tablet (4 mg total) by mouth every 6 (six) hours as needed for muscle spasms. 03/01/22  Yes Glean Hess, MD  traMADol (ULTRAM) 50 MG tablet Take 50 mg by mouth 2 (two) times daily. 09/20/21  Yes [provider]  traZODone (DESYREL) 100 MG tablet TAKE 1 TABLET AT BEDTIME 02/19/22  Yes Glean Hess, MD  allopurinol (ZYLOPRIM) 100 MG tablet TAKE 1 TABLET(100 MG) BY MOUTH DAILY as needed 12/02/18   Glean Hess, MD  diclofenac sodium (VOLTAREN) 1 % GEL Apply 2 g topically 4 (four) times daily. 01/25/19   Gertie Baron, NP  diphenoxylate-atropine (LOMOTIL) 2.5-0.025 MG tablet Take 2 tablets by mouth 4 (four) times daily as needed for diarrhea or loose stools. 01/02/22   Borders, Kirt Boys, NP  furosemide (LASIX) 40 MG tablet Take 40 mg by mouth daily. Patient not taking: Reported on 04/08/2022 03/15/22   [provider]  glucose blood test strip  09/04/15   [provider]  guaiFENesin-dextromethorphan (ROBITUSSIN DM) 100-10 MG/5ML syrup Take 5 mLs by mouth every 4 (four) hours as needed for cough. 08/25/21   Lorella Nimrod, MD  montelukast (SINGULAIR) 10 MG tablet Take 1 tablet (10 mg total) by mouth See admin instructions. Take 1 tab daily for 2 days after daratumumab treatments 06/06/21   Earlie Server, MD  nitroGLYCERIN (NITROSTAT) 0.4 MG SL tablet Place 1 tablet (0.4 mg total) under the tongue every 5 (five) minutes as needed for chest pain. Patient not taking: Reported on 03/25/2022 08/25/21   Lorella Nimrod, MD  promethazine (PHENERGAN) 25 MG tablet Take 1 tablet (25 mg total) by mouth every 6 (six) hours as needed for nausea or vomiting. 02/18/22   Hughie Closs, PA-C    Physical Exam: Vitals:   04/08/22 2000  04/08/22 2145 04/08/22 2152 04/08/22 2214  BP: 109/68  122/89  Pulse:  96  94  Resp: 18 19  18   Temp:   98.3 F (36.8 C) 98.8 F (37.1 C)  TempSrc:   Oral Oral  SpO2:  96%  97%  Weight:      Height:       Physical Exam Vitals and nursing note reviewed.  Constitutional:      General: She is not in acute distress.    Appearance: She is normal weight.  HENT:     Head: Normocephalic and atraumatic.  Eyes:     Extraocular Movements: Extraocular movements intact.     Pupils: Pupils are equal, round, and reactive to light.  Cardiovascular:     Rate and Rhythm: Normal rate and regular rhythm.     Heart sounds: Murmur heard.     Systolic murmur is present.  Pulmonary:     Effort: Pulmonary effort is normal. No tachypnea, accessory muscle usage or respiratory distress.     Breath sounds: Rales (Bilateral middle and lower lung fields.) present. No decreased breath sounds, wheezing or rhonchi.  Chest:     Chest wall: Tenderness (Tenderness to palpation along left lateral chest wall) present.  Abdominal:     General: Bowel sounds are normal.     Palpations: Abdomen is soft.  Musculoskeletal:     Right lower leg: No tenderness. No edema.     Left lower leg: No tenderness. No edema.  Skin:    General: Skin is warm and dry.  Neurological:     General: No focal deficit present.     Mental Status: She is alert and oriented to person, place, and time.  Psychiatric:        Mood and Affect: Mood normal.        Behavior: Behavior normal.    Data Reviewed: Lab Results  Component Value Date   WBC 9.5 04/08/2022   HGB 10.1 (L) 04/08/2022   HCT 31.3 (L) 04/08/2022   MCV 77.3 (L) 04/08/2022   PLT 135 (L) 04/08/2022   Lab Results  Component Value Date   NA 137 04/08/2022   K 4.5 04/08/2022   CO2 23 04/08/2022   GLUCOSE 185 (H) 04/08/2022   BUN 30 (H) 04/08/2022   CREATININE 1.27 (H) 04/08/2022   CALCIUM 9.3 04/08/2022   GFRNONAA 47 (L) 04/08/2022   Troponins negative  x2. Procalcitonin less than 0.10. COVID-19 and influenza test negative. Magnesium level: 1.0  Chest x-ray with linear densities in the left lower lung field suggestive of atelectasis or pneumonia CTA negative for PE but remarkable for mild interstitial prominence and scattered groundglass opacities and trace left pleural effusion  EKG personally reviewed; sinus rhythm with left bundle branch block and frequent PVCs.  Results are pending, will review when available.  Assessment and Plan: * CAP (community acquired pneumonia) Ms. Colasurdo is presenting with 3-day history of productive cough, mild shortness of breath, and pleuritic chest pain with chest x-ray evidence of linear densities consistent with atelectasis versus pneumonia and CTA with evidence of left pleural effusion in the corresponding region.  Given symptoms and elevated white blood count, most likely diagnosis is community-acquired acquired pneumonia complicated by pleuritis.  Procalcitonin is negative, however due to high risk given prior immunosuppression, will cover bacterial etiologies.  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  - Admit for observation - Start Rocephin - Increase dose of doxycycline to 100 mg twice daily - Dilaudid 1 mg IV  every 8 to hours for pleuritis - Legionella and strep pneumo urinary antigens pending - RVP pending  Hypomagnesemia Patient has a long history of hypomagnesemia unable to tolerate p.o. magnesium supplementation.  She was due to receive IV magnesium at her oncologist office but was sent to the ED.  She has received a total of 4 g while here.  - Repeat magnesium level in the a.m.  Multiple myeloma not having achieved remission [facility]) Patient currently following with Dr. Tasia Catchings, oncology.  She has been unable to achieve remission due to frequent infections despite IVIG while on immunotherapy.  We will continue her home  prophylaxis therapy.  -Restart home Acyclovir   Advance Care Planning:   Code Status: Full Code   Consults: None  Family Communication: No family at bedside  Severity of Illness: The appropriate patient status for this patient is OBSERVATION. Observation status is judged to be reasonable and necessary in order to provide the required intensity of service to ensure the patient's safety. The patient's presenting symptoms, physical exam findings, and initial radiographic and laboratory data in the context of their medical condition is felt to place them at decreased risk for further clinical deterioration. Furthermore, it is anticipated that the patient will be medically stable for discharge from the hospital within 2 midnights of admission.   Author: Jose Persia, MD 04/08/2022 10:18 PM  For on call review [email or web address].

## 2022-04-08 NOTE — Assessment & Plan Note (Signed)
Patient has a long history of hypomagnesemia unable to tolerate p.o. magnesium supplementation.  She was due to receive IV magnesium at her oncologist office but was sent to the ED.  She has received a total of 4 g while here.  - Repeat magnesium level in the a.m.

## 2022-04-08 NOTE — ED Provider Triage Note (Addendum)
Emergency Medicine Provider Triage Evaluation Note  Sarah [name] , a 66 y.o. female  was evaluated in triage.  Pt complains of chest pain ongoing all weekend. Sent from cancer center for evaluation.  Physical Exam  Temp 98.4 F (36.9 C) (Oral)   Resp 20   Ht 5' 2.5" (1.588 m)   Wt 69.4 kg   SpO2 97%   BMI 27.54 kg/m  Gen:   Awake, no distress   Resp:  Normal effort  MSK:   Moves extremities without difficulty  Other:    Medical Decision Making  Medically screening exam initiated at 9:46 AM.  Appropriate orders placed.  Willy Vorce was informed that the remainder of the evaluation will be completed by another provider, this initial triage assessment does not replace that evaluation, and the importance of remaining in the ED until their evaluation is complete.  Cardiac workup started. No improvement with SL NTG.  ----------------------------------------- 10:25 AM on 04/08/2022 ----------------------------------------- Patient appears more comfortable. Now sitting in wheelchair playing solitaire on her phone. Initial troponin is normal.   Victorino Dike, FNP 04/08/22 1009    Sherrie George B, FNP 04/08/22 1026

## 2022-04-08 NOTE — ED Provider Notes (Signed)
[facility] Provider Note    None    (approximate)   History   Chest Pain   HPI  Sarah [name] is a 66 y.o. female who on review of cancer center note from today has a history of multiple myeloma, bone marrow transplant, history of frequent infection and pneumonia in February complicated by NSTEMI.  Also recent ED visit in September for CHF and was given diuretic and discharged home at that time.  Patient reports developing productive cough over the weekend and left-sided pain with coughing and inspiration.  Dr. Tasia Catchings recommended the patient be transferred to the ED and also has evidently now on doxycycline and also concern of hypomagnesemia      Physical Exam   Triage Vital Signs: ED Triage Vitals  Enc Vitals Group     BP 04/08/22 0941 110/66     Pulse Rate 04/08/22 0941 72     Resp 04/08/22 0941 20     Temp 04/08/22 0941 98.4 F (36.9 C)     Temp Source 04/08/22 0941 Oral     SpO2 04/08/22 0941 97 %     Weight 04/08/22 0937 153 lb (69.4 kg)     Height 04/08/22 0937 5' 2.5" (1.588 m)     Head Circumference --      Peak Flow --      Pain Score 04/08/22 0937 9     Pain Loc --      Pain Edu? --      Excl. in [place]? --     Most recent vital signs: Vitals:   04/08/22 1320 04/08/22 1435  BP: 108/66 118/66  Pulse: 91 (!) 46  Resp: 16 19  Temp: 98 F (36.7 C) 98.5 F (36.9 C)  SpO2: 94% 96%     General: Awake, no distress.  Appears in pain when she takes a deep breath or laughs.  She reports a sharp pain in the left side of the chest pointing to the left lower chest wall. CV:  Good peripheral perfusion.  Normal heart tones no tachycardia Resp:  Normal effort pleuritic pain with deep breathing, and even somewhat shallow breathing she reports a pleuritic pain in the left chest.  No wheezing. Abd:  No distention. Other:     ED Results / Procedures / Treatments   Labs (all labs ordered are listed, but only abnormal results are  displayed) Labs Reviewed  BASIC METABOLIC PANEL - Abnormal; Notable for the following components:      Result Value   Glucose, Bld 185 (*)    BUN 30 (*)    Creatinine, Ser 1.27 (*)    GFR, Estimated 47 (*)    All other components within normal limits  CBC - Abnormal; Notable for the following components:   Hemoglobin 10.1 (*)    HCT 31.3 (*)    MCV 77.3 (*)    MCH 24.9 (*)    RDW 15.7 (*)    Platelets 135 (*)    All other components within normal limits  LACTIC ACID, PLASMA - Abnormal; Notable for the following components:   Lactic Acid, Venous 2.3 (*)    All other components within normal limits  RESP PANEL BY RT-PCR (FLU A&B, COVID) ARPGX2  CULTURE, BLOOD (ROUTINE X 2)  CULTURE, BLOOD (ROUTINE X 2)  PROCALCITONIN  HIV ANTIBODY (ROUTINE TESTING W REFLEX)  STREP PNEUMONIAE URINARY ANTIGEN  LEGIONELLA PNEUMOPHILA SEROGP 1 UR AG  TROPONIN I (HIGH SENSITIVITY)  TROPONIN I (HIGH SENSITIVITY)  EKG  Interpreted by me at 940 heart rate 80 QRS 160 QTc 520 Left bundle branch block with frequent PVCs.  Compared with previous EKG from September 25 that appears very similar with along the left bundle branch block with frequent PVCs  RADIOLOGY   Chest x-ray personally interpreted by me as no large infiltrate or effusion, however radiologist report does denote possible atelectasis versus small left lower lobe effusion  CT Angio Chest PE W and/or Wo Contrast  Result Date: 04/08/2022 CLINICAL DATA:  Chest pain EXAM: CT ANGIOGRAPHY CHEST WITH CONTRAST TECHNIQUE: Multidetector CT imaging of the chest was performed using the standard protocol during bolus administration of intravenous contrast. Multiplanar CT image reconstructions and MIPs were obtained to evaluate the vascular anatomy. RADIATION DOSE REDUCTION: This exam was performed according to the departmental dose-optimization program which includes automated exposure control, adjustment of the mA and/or kV according to patient size  and/or use of iterative reconstruction technique. CONTRAST:  59m OMNIPAQUE IOHEXOL 350 MG/ML SOLN COMPARISON:  02/04/2022 FINDINGS: Cardiovascular: No filling defects in the pulmonary arteries to suggest pulmonary emboli. Heart is borderline in size. Aorta is normal caliber. Scattered aortic calcifications. Mediastinum/Nodes: Mildly prominent subcarinal lymph node measuring 10 mm, favor reactive. No hilar or axillary adenopathy. Lungs/Pleura: Small left pleural effusion. Interstitial prominence and mild ground-glass opacities in the lungs could reflect edema. Upper Abdomen: No acute findings Musculoskeletal: Chest wall soft tissues are unremarkable. No acute osseous abnormality. Review of the MIP images confirms the above findings. IMPRESSION: No evidence of pulmonary embolus. Borderline heart size. Mild interstitial prominence and scattered ground-glass opacities could reflect early interstitial edema. Trace left pleural effusion. Aortic Atherosclerosis (ICD10-I70.0). Electronically Signed   By: KRolm BaptiseM.D.   On: 04/08/2022 14:08   DG Chest 2 View  Result Date: 04/08/2022 CLINICAL DATA:  Chest pain EXAM: CHEST - 2 VIEW COMPARISON:  03/25/2022 FINDINGS: Cardiac size is within normal limits. There are no signs of alveolar pulmonary edema. There is no focal consolidation. Increased interstitial markings are seen in left lower lung field. There is no pleural effusion or pneumothorax. Tip of right IJ chest port is noted at the junction of superior vena cava and right atrium. IMPRESSION: Linear densities in left lower lung fields suggest subsegmental atelectasis/pneumonia. Electronically Signed   By: PElmer PickerM.D.   On: 04/08/2022 10:18      PROCEDURES:  Critical Care performed: No  Procedures   MEDICATIONS ORDERED IN ED: Medications  HYDROmorphone (DILAUDID) injection 1 mg (has no administration in time range)  enoxaparin (LOVENOX) injection 40 mg (has no administration in time range)   sodium chloride flush (NS) 0.9 % injection 3 mL (has no administration in time range)  polyethylene glycol (MIRALAX / GLYCOLAX) packet 17 g (has no administration in time range)  magnesium sulfate IVPB 2 g 50 mL (has no administration in time range)  magnesium sulfate IVPB 2 g 50 mL (0 g Intravenous Stopped 04/08/22 1254)  HYDROmorphone (DILAUDID) injection 0.5 mg (0.5 mg Intravenous Given 04/08/22 1153)  iohexol (OMNIPAQUE) 350 MG/ML injection 75 mL (75 mLs Intravenous Contrast Given 04/08/22 1349)  HYDROmorphone (DILAUDID) injection 0.5 mg (0.5 mg Intravenous Given 04/08/22 1312)  promethazine (PHENERGAN) injection 6.25 mg (6.25 mg Intramuscular Given 04/08/22 1527)  HYDROmorphone (DILAUDID) injection 0.5 mg (0.5 mg Intravenous Given 04/08/22 1500)     IMPRESSION / MDM / ASSESSMENT AND PLAN / ED COURSE  I reviewed the triage vital signs and the nursing notes.  Differential diagnosis includes, but is not limited to, pneumonia, pleurisy, pericarditis though not evidence by EKG and not clearly pericarditis by history, pulmonary embolism, atypical ACS etc.  EKG without notable change left bundle branch block, troponin normal.  Symptoms atypical of ACS.  Does report productive cough but no fever and is currently on doxycycline  Patient's presentation is most consistent with acute complicated illness / injury requiring diagnostic workup.  The patient is on the cardiac monitor to evaluate for evidence of arrhythmia and/or significant heart rate changes.  Labs reviewed, notable for mildly elevated lactic acid.  Chronic anemia.  Chronic renal disease.  Troponin normal on 2 checks  As the patient continues to have intractable pleuritic left-sided chest pain with CT findings now concerning for small left pleural effusion and the etiology is not yet clear but has a history of immunosuppression and frequent infections discussed with oncologist Dr. Tasia Catchings, she advises that as the  patient is on doxycycline and has also had productive cough for at least the last couple days that consideration for broader spectrum antibiotic coverage should be made.  Patient is already covered on doxycycline and procalcitonin is normal normal white count afebrile I do not myself see obvious evidence of acute bacterial infection, but also viral studies COVID and influenza are negative.  Will discuss case and I consulted with her hospitalist Dr. Charleen Kirks who will see and evaluate to develop further plan of care and request for admission has been made.  Defer decision to antibiotic initiation to the hospitalist at this point.  Patient understanding agreeable with plan for consultation with the hospitalist.  Anticipate admission, but ongoing ED care including follow-up on hospitalist recommendations assigned to the oncoming partner Dr. Ellender Hose.  If hospitalist recommends discharge I think that would also be a reasonable consideration, but I question if a broader appropriate outpatient antibiotic coverage could be arranged at this juncture and also concern for the patient's seemingly severe and somewhat intractable left-sided pleurisy     FINAL CLINICAL IMPRESSION(S) / ED DIAGNOSES   Final diagnoses:  Pleural effusion on left  Pleurisy  Chest pain with low risk for cardiac etiology     Rx / DC Orders   ED Discharge Orders     None        Note:  This document was prepared using Dragon voice recognition software and may include unintentional dictation errors.   Delman Kitten, MD 04/08/22 [phone]

## 2022-04-09 DIAGNOSIS — E785 Hyperlipidemia, unspecified: Secondary | ICD-10-CM | POA: Diagnosis present

## 2022-04-09 DIAGNOSIS — J189 Pneumonia, unspecified organism: Secondary | ICD-10-CM

## 2022-04-09 DIAGNOSIS — R091 Pleurisy: Secondary | ICD-10-CM

## 2022-04-09 DIAGNOSIS — K760 Fatty (change of) liver, not elsewhere classified: Secondary | ICD-10-CM | POA: Diagnosis present

## 2022-04-09 DIAGNOSIS — N1832 Chronic kidney disease, stage 3b: Secondary | ICD-10-CM

## 2022-04-09 DIAGNOSIS — Q231 Congenital insufficiency of aortic valve: Secondary | ICD-10-CM | POA: Diagnosis not present

## 2022-04-09 DIAGNOSIS — I5022 Chronic systolic (congestive) heart failure: Secondary | ICD-10-CM | POA: Diagnosis present

## 2022-04-09 DIAGNOSIS — C9001 Multiple myeloma in remission: Secondary | ICD-10-CM | POA: Diagnosis not present

## 2022-04-09 DIAGNOSIS — F419 Anxiety disorder, unspecified: Secondary | ICD-10-CM | POA: Diagnosis present

## 2022-04-09 DIAGNOSIS — K219 Gastro-esophageal reflux disease without esophagitis: Secondary | ICD-10-CM | POA: Diagnosis present

## 2022-04-09 DIAGNOSIS — E1122 Type 2 diabetes mellitus with diabetic chronic kidney disease: Secondary | ICD-10-CM | POA: Diagnosis present

## 2022-04-09 DIAGNOSIS — E1169 Type 2 diabetes mellitus with other specified complication: Secondary | ICD-10-CM | POA: Diagnosis present

## 2022-04-09 DIAGNOSIS — M272 Inflammatory conditions of jaws: Secondary | ICD-10-CM | POA: Diagnosis present

## 2022-04-09 DIAGNOSIS — R079 Chest pain, unspecified: Secondary | ICD-10-CM | POA: Diagnosis not present

## 2022-04-09 DIAGNOSIS — F32A Depression, unspecified: Secondary | ICD-10-CM | POA: Diagnosis present

## 2022-04-09 DIAGNOSIS — I252 Old myocardial infarction: Secondary | ICD-10-CM | POA: Diagnosis not present

## 2022-04-09 DIAGNOSIS — I447 Left bundle-branch block, unspecified: Secondary | ICD-10-CM | POA: Diagnosis present

## 2022-04-09 DIAGNOSIS — I493 Ventricular premature depolarization: Secondary | ICD-10-CM | POA: Diagnosis present

## 2022-04-09 DIAGNOSIS — C9000 Multiple myeloma not having achieved remission: Secondary | ICD-10-CM | POA: Diagnosis present

## 2022-04-09 DIAGNOSIS — Z1152 Encounter for screening for COVID-19: Secondary | ICD-10-CM | POA: Diagnosis not present

## 2022-04-09 DIAGNOSIS — Z9481 Bone marrow transplant status: Secondary | ICD-10-CM | POA: Diagnosis not present

## 2022-04-09 DIAGNOSIS — D631 Anemia in chronic kidney disease: Secondary | ICD-10-CM | POA: Diagnosis present

## 2022-04-09 DIAGNOSIS — I130 Hypertensive heart and chronic kidney disease with heart failure and stage 1 through stage 4 chronic kidney disease, or unspecified chronic kidney disease: Secondary | ICD-10-CM | POA: Diagnosis present

## 2022-04-09 DIAGNOSIS — M1990 Unspecified osteoarthritis, unspecified site: Secondary | ICD-10-CM | POA: Diagnosis present

## 2022-04-09 DIAGNOSIS — I429 Cardiomyopathy, unspecified: Secondary | ICD-10-CM | POA: Diagnosis present

## 2022-04-09 DIAGNOSIS — R296 Repeated falls: Secondary | ICD-10-CM | POA: Diagnosis present

## 2022-04-09 HISTORY — DX: Pleurisy: R09.1

## 2022-04-09 LAB — CBC WITH DIFFERENTIAL/PLATELET
Abs Immature Granulocytes: 0.04 10*3/uL (ref 0.00–0.07)
Basophils Absolute: 0 10*3/uL (ref 0.0–0.1)
Basophils Relative: 0 %
Eosinophils Absolute: 0.1 10*3/uL (ref 0.0–0.5)
Eosinophils Relative: 1 %
HCT: 31.7 % — ABNORMAL LOW (ref 36.0–46.0)
Hemoglobin: 9.7 g/dL — ABNORMAL LOW (ref 12.0–15.0)
Immature Granulocytes: 1 %
Lymphocytes Relative: 12 %
Lymphs Abs: 1 10*3/uL (ref 0.7–4.0)
MCH: 24.6 pg — ABNORMAL LOW (ref 26.0–34.0)
MCHC: 30.6 g/dL (ref 30.0–36.0)
MCV: 80.5 fL (ref 80.0–100.0)
Monocytes Absolute: 0.6 10*3/uL (ref 0.1–1.0)
Monocytes Relative: 7 %
Neutro Abs: 6.8 10*3/uL (ref 1.7–7.7)
Neutrophils Relative %: 79 %
Platelets: 121 10*3/uL — ABNORMAL LOW (ref 150–400)
RBC: 3.94 MIL/uL (ref 3.87–5.11)
RDW: 16.1 % — ABNORMAL HIGH (ref 11.5–15.5)
WBC: 8.5 10*3/uL (ref 4.0–10.5)
nRBC: 0 % (ref 0.0–0.2)

## 2022-04-09 LAB — RESPIRATORY PANEL BY PCR

## 2022-04-09 LAB — COMPREHENSIVE METABOLIC PANEL
ALT: 27 U/L (ref 0–44)
AST: 34 U/L (ref 15–41)
Albumin: 3.8 g/dL (ref 3.5–5.0)
Alkaline Phosphatase: 87 U/L (ref 38–126)
Anion gap: 9 (ref 5–15)
BUN: 29 mg/dL — ABNORMAL HIGH (ref 8–23)
CO2: 24 mmol/L (ref 22–32)
Calcium: 8.8 mg/dL — ABNORMAL LOW (ref 8.9–10.3)
Chloride: 103 mmol/L (ref 98–111)
Creatinine, Ser: 1.48 mg/dL — ABNORMAL HIGH (ref 0.44–1.00)
GFR, Estimated: 39 mL/min — ABNORMAL LOW (ref >60)
Glucose, Bld: 134 mg/dL — ABNORMAL HIGH (ref 70–99)
Potassium: 4.1 mmol/L (ref 3.5–5.1)
Sodium: 136 mmol/L (ref 135–145)
Total Bilirubin: 0.5 mg/dL (ref 0.3–1.2)
Total Protein: 6.8 g/dL (ref 6.5–8.1)

## 2022-04-09 LAB — CBG MONITORING, ED
Glucose-Capillary: 131 mg/dL — ABNORMAL HIGH (ref 70–99)
Glucose-Capillary: 145 mg/dL — ABNORMAL HIGH (ref 70–99)

## 2022-04-09 LAB — HIV ANTIBODY (ROUTINE TESTING W REFLEX): HIV Screen 4th Generation wRfx: NONREACTIVE

## 2022-04-09 LAB — GLUCOSE, CAPILLARY
Glucose-Capillary: 111 mg/dL — ABNORMAL HIGH (ref 70–99)
Glucose-Capillary: 136 mg/dL — ABNORMAL HIGH (ref 70–99)

## 2022-04-09 LAB — MAGNESIUM: Magnesium: 2.2 mg/dL (ref 1.7–2.4)

## 2022-04-09 MED ORDER — FENTANYL CITRATE PF 50 MCG/ML IJ SOSY
25.0000 ug | PREFILLED_SYRINGE | INTRAMUSCULAR | Status: DC | PRN
  Administered 2022-04-09 – 2022-04-10 (×8): 25 ug via INTRAVENOUS
  Filled 2022-04-09 (×8): qty 1

## 2022-04-09 MED ORDER — TRAMADOL HCL 50 MG PO TABS
50.0000 mg | ORAL_TABLET | Freq: Two times a day (BID) | ORAL | Status: DC
  Administered 2022-04-09 – 2022-04-10 (×4): 50 mg via ORAL
  Filled 2022-04-09 (×4): qty 1

## 2022-04-09 MED ORDER — LIDOCAINE 5 % EX PTCH
1.0000 | MEDICATED_PATCH | CUTANEOUS | Status: DC
  Administered 2022-04-09 (×2): 1 via TRANSDERMAL
  Filled 2022-04-09 (×2): qty 1

## 2022-04-09 MED ORDER — HYDROXYZINE HCL 50 MG PO TABS
25.0000 mg | ORAL_TABLET | Freq: Four times a day (QID) | ORAL | Status: DC | PRN
  Administered 2022-04-09 (×4): 25 mg via ORAL
  Filled 2022-04-09 (×4): qty 1

## 2022-04-09 MED ORDER — TRAZODONE HCL 50 MG PO TABS
100.0000 mg | ORAL_TABLET | Freq: Every day | ORAL | Status: DC
  Administered 2022-04-09 (×2): 100 mg via ORAL
  Filled 2022-04-09: qty 1
  Filled 2022-04-09: qty 2

## 2022-04-09 NOTE — ED Notes (Signed)
Pt c/o intense chest pain that is not new but has intensified. Pt given available pain meds. EKG repeated, as per ED MD EKG looks similar to previous EKG. [facility] hospitalist made aware. No further orders at this time. Pt vitals signs stable

## 2022-04-09 NOTE — Progress Notes (Signed)
PROGRESS NOTE    Sarah [name]  HGD:[id number] DOB: Jul 15, 1955 DOA: 04/08/2022 PCP: Glean Hess, MD     Brief Narrative:  Sarah [name] is a 66 y.o. female with medical history significant of multiple myeloma s/p bone marrow transplant, HFrEF with recovered EF, PVCs, CKD stage IIIa, type 2 diabetes, who presents to the ED with complaints of chest pain.  On 03/26/2022, she presented to the ED with shortness of breath and chest tightness.  She was found to be volume overloaded and treated with IV Lasix at which time she was discharged home.  She states she felt better after this visit and had followed up with her nephrologist and oncologist.  Then on Friday, 10/06, she developed productive cough with mild shortness of breath.  The symptom that was most unusual for her is severe left-sided chest pain that is exacerbated by turning positions, deep breaths, coughing and even talking.   CTA was obtained that demonstrated mild interstitial prominence and scattered groundglass opacities with trace left pleural effusion.  No evidence of PE.  She was started on IV antibiotics and admitted to the hospital.  New events last 24 hours / Subjective: Patient continues to have left-sided sharp chest pain.  Pain is reproducible to palpation, worse with cough, deep breath, movement.  Also admits to productive cough over the weekend of yellow thick sputum.  Denies any fevers at home.  Admits to shortness of breath  Assessment & Plan:   Principal Problem:   CAP (community acquired pneumonia) Active Problems:   Hypomagnesemia   Multiple myeloma not having achieved remission (HCC)   Type II diabetes mellitus with complication (HCC)   Hyperlipidemia associated with type 2 diabetes mellitus ([place])   Coronary artery disease involving native coronary artery of native heart   Multiple myeloma in remission (HCC)   Chronic systolic CHF (congestive heart failure) (HCC)   Pleurisy   Stage 3b chronic kidney  disease (CKD) (HCC)   Community-acquired pneumonia -Respiratory viral panel, COVID, influenza negative -Strep pneumo antigen, Legionella antigen pending -Continue Rocephin, doxycycline  Pleurisy -Insetting of mild pleural effusion and community-acquired pneumonia -Pain management  History of osteomyelitis of the jaw -On chronic doxycycline  Diabetes mellitus -Sliding scale insulin  Chronic systolic HF -Stable -Entresto, Lasix  CKD stage IIIb -Baseline creatinine 1-1.3 -Stable  Multiple myeloma -Followed by Dr. Tasia Catchings, oncology.  Status post bone marrow transplant  Mood disorders -Cymbalta, Prozac, tramadol, trazodone  DVT prophylaxis:  enoxaparin (LOVENOX) injection 40 mg Start: 04/08/22 2200  Code Status: Full code Family Communication: No family at bedside Disposition Plan:  Status is: Inpatient Remains inpatient appropriate because: IV antibiotics  Antimicrobials:  Anti-infectives (From admission, onward)    Start     Dose/Rate Route Frequency Ordered Stop   04/09/22 1000  azithromycin (ZITHROMAX) tablet 250 mg  Status:  Discontinued        250 mg Oral Daily 04/08/22 1639 04/08/22 2220   04/08/22 2230  cefTRIAXone (ROCEPHIN) 2 g in sodium chloride 0.9 % 100 mL IVPB        2 g 200 mL/hr over 30 Minutes Intravenous Every 24 hours 04/08/22 2219     04/08/22 2215  acyclovir (ZOVIRAX) tablet 400 mg  Status:  Discontinued        400 mg Oral 2 times daily 04/08/22 2202 04/08/22 2212   04/08/22 2215  doxycycline (VIBRAMYCIN) 50 MG capsule 50 mg  Status:  Discontinued        50 mg Oral 2 times daily  04/08/22 2202 04/08/22 2202   04/08/22 2215  doxycycline (VIBRA-TABS) tablet 100 mg        100 mg Oral Every 12 hours 04/08/22 2202     04/08/22 2215  acyclovir (ZOVIRAX) 200 MG capsule 400 mg        400 mg Oral 2 times daily 04/08/22 2212     04/08/22 1645  azithromycin (ZITHROMAX) tablet 500 mg        500 mg Oral  Once 04/08/22 1639 04/08/22 1649         Objective: Vitals:   04/09/22 0839 04/09/22 0941 04/09/22 1015 04/09/22 1120  BP:  91/62    Pulse: 73 (!) 43 84   Resp: 19 (!) 25 19   Temp:    99.6 F (37.6 C)  TempSrc:    Oral  SpO2: 93% 92% 92%   Weight:      Height:        Intake/Output Summary (Last 24 hours) at 04/09/2022 1209 Last data filed at 04/09/2022 0007 Gross per 24 hour  Intake 100.26 ml  Output --  Net 100.26 ml   Filed Weights   04/08/22 0937  Weight: 69.4 kg    Examination:  General exam: Appears calm and comfortable  Respiratory system: Diminished breath sounds, respiratory effort is normal, on room air, no conversational dyspnea Cardiovascular system: S1 & S2 heard, RRR. No murmurs. No pedal edema. Gastrointestinal system: Abdomen is nondistended, soft and nontender. Normal bowel sounds heard. Central nervous system: Alert and oriented. No focal neurological deficits. Speech clear.  Extremities: Symmetric in appearance  Skin: No rashes, lesions or ulcers on exposed skin  Psychiatry: Judgement and insight appear normal. Mood & affect appropriate.   Data Reviewed: I have personally reviewed following labs and imaging studies  CBC: Recent Labs  Lab 04/08/22 0943 04/09/22 0427  WBC 9.5 8.5  NEUTROABS  --  6.8  HGB 10.1* 9.7*  HCT 31.3* 31.7*  MCV 77.3* 80.5  PLT 135* 588*   Basic Metabolic Panel: Recent Labs  Lab 04/03/22 0818 04/04/22 0839 04/04/22 0939 04/08/22 0821 04/08/22 0943 04/09/22 0427  NA 135  --   --   --  137 136  K 3.3*  --   --  4.0 4.5 4.1  CL 105  --   --   --  107 103  CO2 24  --   --   --  23 24  GLUCOSE 154*  --   --   --  185* 134*  BUN 46*  --   --   --  30* 29*  CREATININE 1.46*  --   --   --  1.27* 1.48*  CALCIUM 8.1*  --   --   --  9.3 8.8*  MG 0.8* 2.2 2.2 1.0*  --  2.2   GFR: Estimated Creatinine Clearance: 35 mL/min (A) (by C-G formula based on SCr of 1.48 mg/dL (H)). Liver Function Tests: Recent Labs  Lab 04/09/22 0427  AST 34  ALT 27   ALKPHOS 87  BILITOT 0.5  PROT 6.8  ALBUMIN 3.8   No results for input(s): "LIPASE", "AMYLASE" in the last 168 hours. No results for input(s): "AMMONIA" in the last 168 hours. Coagulation Profile: No results for input(s): "INR", "PROTIME" in the last 168 hours. Cardiac Enzymes: No results for input(s): "CKTOTAL", "CKMB", "CKMBINDEX", "TROPONINI" in the last 168 hours. BNP (last 3 results) No results for input(s): "PROBNP" in the last 8760 hours. HbA1C: No results for input(s): "HGBA1C"  in the last 72 hours. CBG: Recent Labs  Lab 04/09/22 0836 04/09/22 1127  GLUCAP 131* 145*   Lipid Profile: No results for input(s): "CHOL", "HDL", "LDLCALC", "TRIG", "CHOLHDL", "LDLDIRECT" in the last 72 hours. Thyroid Function Tests: No results for input(s): "TSH", "T4TOTAL", "FREET4", "T3FREE", "THYROIDAB" in the last 72 hours. Anemia Panel: No results for input(s): "VITAMINB12", "FOLATE", "FERRITIN", "TIBC", "IRON", "RETICCTPCT" in the last 72 hours. Sepsis Labs: Recent Labs  Lab 04/08/22 0913 04/08/22 1209  PROCALCITON <0.10  --   LATICACIDVEN  --  2.3*    Recent Results (from the past 240 hour(s))  Blood culture (routine x 2)     Status: None (Preliminary result)   Collection Time: 04/08/22 12:06 PM   Specimen: BLOOD  Result Value Ref Range Status   Specimen Description BLOOD LEFT ANTECUBITAL  Final   Special Requests   Final    BOTTLES DRAWN AEROBIC AND ANAEROBIC Blood Culture adequate volume   Culture   Final    NO GROWTH < 24 HOURS Performed at [facility], [address], [place], [place] [address]    Report Status PENDING  Incomplete  Blood culture (routine x 2)     Status: None (Preliminary result)   Collection Time: 04/08/22 12:12 PM   Specimen: BLOOD RIGHT HAND  Result Value Ref Range Status   Specimen Description BLOOD RIGHT HAND  Final   Special Requests   Final    BOTTLES DRAWN AEROBIC AND ANAEROBIC Blood Culture adequate volume   Culture   Final     NO GROWTH < 24 HOURS Performed at [facility], [address], [place], [place] [address]    Report Status PENDING  Incomplete  Resp Panel by RT-PCR (Flu A&B, Covid) Anterior Nasal Swab     Status: None   Collection Time: 04/08/22  1:31 PM   Specimen: Anterior Nasal Swab  Result Value Ref Range Status   SARS Coronavirus 2 by RT PCR NEGATIVE NEGATIVE Final    Comment: (NOTE) SARS-CoV-2 target nucleic acids are NOT DETECTED.  The SARS-CoV-2 RNA is generally detectable in upper respiratory specimens during the acute phase of infection. The lowest concentration of SARS-CoV-2 viral copies this assay can detect is 138 copies/mL. A negative result does not preclude SARS-Cov-2 infection and should not be used as the sole basis for treatment or other patient management decisions. A negative result may occur with  improper specimen collection/handling, submission of specimen other than nasopharyngeal swab, presence of viral mutation(s) within the areas targeted by this assay, and inadequate number of viral copies(<138 copies/mL). A negative result must be combined with clinical observations, patient history, and epidemiological information. The expected result is Negative.  Fact Sheet for Patients:  EntrepreneurPulse.com.au  Fact Sheet for Healthcare Providers:  IncredibleEmployment.be  This test is no t yet approved or cleared by the [place] FDA and  has been authorized for detection and/or diagnosis of SARS-CoV-2 by FDA under an Emergency Use Authorization (EUA). This EUA will remain  in effect (meaning this test can be used) for the duration of the COVID-19 declaration under Section 564(b)(1) of the Act, 21 U.S.C.section 360bbb-3(b)(1), unless the authorization is terminated  or revoked sooner.       Influenza A by PCR NEGATIVE NEGATIVE Final   Influenza B by PCR NEGATIVE NEGATIVE Final    Comment: (NOTE) The Xpert Xpress  SARS-CoV-2/FLU/RSV plus assay is intended as an aid in the diagnosis of influenza from Nasopharyngeal swab specimens and should not be  used as a sole basis for treatment. Nasal washings and aspirates are unacceptable for Xpert Xpress SARS-CoV-2/FLU/RSV testing.  Fact Sheet for Patients: EntrepreneurPulse.com.au  Fact Sheet for Healthcare Providers: IncredibleEmployment.be  This test is not yet approved or cleared by the [place] FDA and has been authorized for detection and/or diagnosis of SARS-CoV-2 by FDA under an Emergency Use Authorization (EUA). This EUA will remain in effect (meaning this test can be used) for the duration of the COVID-19 declaration under Section 564(b)(1) of the Act, 21 U.S.C. section 360bbb-3(b)(1), unless the authorization is terminated or revoked.  Performed at [facility], [place], [place] [address]   Respiratory (~20 pathogens) panel by PCR     Status: None   Collection Time: 04/09/22  4:27 AM   Specimen: Nasopharyngeal Swab; Respiratory  Result Value Ref Range Status   Adenovirus NOT DETECTED NOT DETECTED Final   Coronavirus 229E NOT DETECTED NOT DETECTED Final    Comment: (NOTE) The Coronavirus on the Respiratory Panel, DOES NOT test for the novel  Coronavirus (2019 nCoV)    Coronavirus HKU1 NOT DETECTED NOT DETECTED Final   Coronavirus NL63 NOT DETECTED NOT DETECTED Final   Coronavirus OC43 NOT DETECTED NOT DETECTED Final   Metapneumovirus NOT DETECTED NOT DETECTED Final   Rhinovirus / Enterovirus NOT DETECTED NOT DETECTED Final   Influenza A NOT DETECTED NOT DETECTED Final   Influenza B NOT DETECTED NOT DETECTED Final   Parainfluenza Virus 1 NOT DETECTED NOT DETECTED Final   Parainfluenza Virus 2 NOT DETECTED NOT DETECTED Final   Parainfluenza Virus 3 NOT DETECTED NOT DETECTED Final   Parainfluenza Virus 4 NOT DETECTED NOT DETECTED Final   Respiratory Syncytial Virus NOT  DETECTED NOT DETECTED Final   Bordetella pertussis NOT DETECTED NOT DETECTED Final   Bordetella Parapertussis NOT DETECTED NOT DETECTED Final   Chlamydophila pneumoniae NOT DETECTED NOT DETECTED Final   Mycoplasma pneumoniae NOT DETECTED NOT DETECTED Final    Comment: Performed at [facility], [place]. [address], [place], [place] [address]      Radiology Studies: CT Angio Chest PE W and/or Wo Contrast  Result Date: 04/08/2022 CLINICAL DATA:  Chest pain EXAM: CT ANGIOGRAPHY CHEST WITH CONTRAST TECHNIQUE: Multidetector CT imaging of the chest was performed using the standard protocol during bolus administration of intravenous contrast. Multiplanar CT image reconstructions and MIPs were obtained to evaluate the vascular anatomy. RADIATION DOSE REDUCTION: This exam was performed according to the departmental dose-optimization program which includes automated exposure control, adjustment of the mA and/or kV according to patient size and/or use of iterative reconstruction technique. CONTRAST:  40m OMNIPAQUE IOHEXOL 350 MG/ML SOLN COMPARISON:  02/04/2022 FINDINGS: Cardiovascular: No filling defects in the pulmonary arteries to suggest pulmonary emboli. Heart is borderline in size. Aorta is normal caliber. Scattered aortic calcifications. Mediastinum/Nodes: Mildly prominent subcarinal lymph node measuring 10 mm, favor reactive. No hilar or axillary adenopathy. Lungs/Pleura: Small left pleural effusion. Interstitial prominence and mild ground-glass opacities in the lungs could reflect edema. Upper Abdomen: No acute findings Musculoskeletal: Chest wall soft tissues are unremarkable. No acute osseous abnormality. Review of the MIP images confirms the above findings. IMPRESSION: No evidence of pulmonary embolus. Borderline heart size. Mild interstitial prominence and scattered ground-glass opacities could reflect early interstitial edema. Trace left pleural effusion. Aortic Atherosclerosis (ICD10-I70.0).  Electronically Signed   By: KRolm BaptiseM.D.   On: 04/08/2022 14:08   DG Chest 2 View  Result Date: 04/08/2022 CLINICAL DATA:  Chest pain  EXAM: CHEST - 2 VIEW COMPARISON:  03/25/2022 FINDINGS: Cardiac size is within normal limits. There are no signs of alveolar pulmonary edema. There is no focal consolidation. Increased interstitial markings are seen in left lower lung field. There is no pleural effusion or pneumothorax. Tip of right IJ chest port is noted at the junction of superior vena cava and right atrium. IMPRESSION: Linear densities in left lower lung fields suggest subsegmental atelectasis/pneumonia. Electronically Signed   By: Elmer Picker M.D.   On: 04/08/2022 10:18      Scheduled Meds:  acyclovir  400 mg Oral BID   atorvastatin  5 mg Oral Daily   bisoprolol  5 mg Oral Daily   doxycycline  100 mg Oral Q12H   DULoxetine  60 mg Oral Daily   enoxaparin (LOVENOX) injection  40 mg Subcutaneous Q24H   FLUoxetine  40 mg Oral Daily   furosemide  40 mg Oral Daily   insulin aspart  0-15 Units Subcutaneous TID WC   lidocaine  1 patch Transdermal Q24H   pantoprazole  40 mg Oral Daily   pentoxifylline  400 mg Oral TID   sacubitril-valsartan  1 tablet Oral BID   sodium chloride flush  3 mL Intravenous Q12H   traMADol  50 mg Oral BID   traZODone  100 mg Oral QHS   Continuous Infusions:  cefTRIAXone (ROCEPHIN)  IV Stopped (04/09/22 0007)     LOS: 0 days     Dessa Phi, DO [facility] 04/09/2022, 12:09 PM   Available via Epic secure chat 7am-7pm After these hours, please refer to coverage provider listed on [email or web address]

## 2022-04-10 DIAGNOSIS — R079 Chest pain, unspecified: Secondary | ICD-10-CM

## 2022-04-10 DIAGNOSIS — E785 Hyperlipidemia, unspecified: Secondary | ICD-10-CM

## 2022-04-10 DIAGNOSIS — E1169 Type 2 diabetes mellitus with other specified complication: Secondary | ICD-10-CM

## 2022-04-10 DIAGNOSIS — I5022 Chronic systolic (congestive) heart failure: Secondary | ICD-10-CM | POA: Diagnosis not present

## 2022-04-10 DIAGNOSIS — I493 Ventricular premature depolarization: Secondary | ICD-10-CM | POA: Diagnosis not present

## 2022-04-10 DIAGNOSIS — J189 Pneumonia, unspecified organism: Secondary | ICD-10-CM | POA: Diagnosis not present

## 2022-04-10 DIAGNOSIS — C9001 Multiple myeloma in remission: Secondary | ICD-10-CM

## 2022-04-10 LAB — BASIC METABOLIC PANEL
Anion gap: 8 (ref 5–15)
BUN: 29 mg/dL — ABNORMAL HIGH (ref 8–23)
CO2: 23 mmol/L (ref 22–32)
Calcium: 8.4 mg/dL — ABNORMAL LOW (ref 8.9–10.3)
Chloride: 105 mmol/L (ref 98–111)
Creatinine, Ser: 1.29 mg/dL — ABNORMAL HIGH (ref 0.44–1.00)
GFR, Estimated: 46 mL/min — ABNORMAL LOW (ref >60)
Glucose, Bld: 121 mg/dL — ABNORMAL HIGH (ref 70–99)
Potassium: 3.6 mmol/L (ref 3.5–5.1)
Sodium: 136 mmol/L (ref 135–145)

## 2022-04-10 LAB — GLUCOSE, CAPILLARY
Glucose-Capillary: 116 mg/dL — ABNORMAL HIGH (ref 70–99)
Glucose-Capillary: 139 mg/dL — ABNORMAL HIGH (ref 70–99)

## 2022-04-10 LAB — MAGNESIUM: Magnesium: 1.6 mg/dL — ABNORMAL LOW (ref 1.7–2.4)

## 2022-04-10 MED ORDER — HEPARIN SOD (PORK) LOCK FLUSH 100 UNIT/ML IV SOLN
500.0000 [IU] | Freq: Once | INTRAVENOUS | Status: AC
  Administered 2022-04-10: 500 [IU] via INTRAVENOUS
  Filled 2022-04-10: qty 5

## 2022-04-10 MED ORDER — CHLORHEXIDINE GLUCONATE CLOTH 2 % EX PADS
6.0000 | MEDICATED_PAD | Freq: Every day | CUTANEOUS | Status: DC
  Administered 2022-04-10: 6 via TOPICAL

## 2022-04-10 MED ORDER — GUAIFENESIN-DM 100-10 MG/5ML PO SYRP
5.0000 mL | ORAL_SOLUTION | ORAL | Status: DC | PRN
  Administered 2022-04-10: 5 mL via ORAL
  Filled 2022-04-10: qty 10

## 2022-04-10 MED ORDER — DOXYCYCLINE HYCLATE 100 MG PO TABS: 100.0000 mg | ORAL_TABLET | Freq: Two times a day (BID) | ORAL | 0 refills | Status: AC

## 2022-04-10 MED ORDER — FLUCONAZOLE 50 MG PO TABS
150.0000 mg | ORAL_TABLET | Freq: Once | ORAL | Status: AC
  Administered 2022-04-10: 150 mg via ORAL
  Filled 2022-04-10: qty 1

## 2022-04-10 NOTE — Progress Notes (Signed)
Mobility Specialist - Progress Note  Pre-mobility: SpO2 94% During mobility: SpO2 92% Post-mobility: SPO2 93%   04/10/22 0934  Mobility  Activity Ambulated independently in hallway  Level of Assistance Modified independent, requires aide device or extra time  Assistive Device Front wheel walker  Distance Ambulated (ft) 40 ft  Activity Response Tolerated well  Mobility Referral Yes  $Mobility charge 1 Mobility   Pt supine upon entry, utilizing RA. Pt voiced some SOB and dizziness before starting activity, but willing to continue. Pt STS to RW and ambulation ModI. Pt ambulated 40 ft in the hallway with RW, O2 staying above 90%. Pt voiced that dizziness and SOB were about the same as before ambulation. Pt left supine with alarm set and needs within reach.   [place]ie [place] Mobility Specialist 04/10/22 9:40 AM

## 2022-04-10 NOTE — TOC Initial Note (Signed)
Transition of Care [facility]) - Initial/Assessment Note    Patient Details  Name: Sarah [name] MRN: [id number] Date of Birth: 02/05/1956  Transition of Care [facility]) CM/SW Contact:    Laurena Slimmer, RN Phone Number: 04/10/2022, 1:37 PM  Clinical Narrative:                  Transition of Care [facility]) Screening Note   Patient Details  Name: Sarah [name] Date of Birth: 11-Aug-1955   Transition of Care [facility]) CM/SW Contact:    Laurena Slimmer, RN Phone Number: 04/10/2022, 1:37 PM    Transition of Care Department [facility]) has reviewed patient and no TOC needs have been identified at this time. We will continue to monitor patient advancement through interdisciplinary progression rounds. If new patient transition needs arise, please place a TOC consult.          Patient Goals and CMS Choice        Expected Discharge Plan and Services           Expected Discharge Date: 04/10/22                                    Prior Living Arrangements/Services                       Activities of Daily Living Home Assistive Devices/Equipment: Eyeglasses ADL Screening (condition at time of admission) Patient's cognitive ability adequate to safely complete daily activities?: Yes Is the patient deaf or have difficulty hearing?: No Does the patient have difficulty seeing, even when wearing glasses/contacts?: No Does the patient have difficulty concentrating, remembering, or making decisions?: No Patient able to express need for assistance with ADLs?: Yes Does the patient have difficulty dressing or bathing?: No Independently performs ADLs?: Yes (appropriate for developmental age) Does the patient have difficulty walking or climbing stairs?: No Weakness of Legs: None Weakness of Arms/Hands: None  Permission Sought/Granted                  Emotional Assessment              Admission diagnosis:  CAP (community acquired pneumonia) [J18.9] Pleural effusion on  left [J90] Pleurisy [R09.1] Chest pain with low risk for cardiac etiology [R07.9] Patient Active Problem List   Diagnosis Date Noted   Pleurisy 04/09/2022   Stage 3b chronic kidney disease (CKD) ([place]) 04/09/2022   CAP (community acquired pneumonia) 04/08/2022   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   Overweight (BMI 25.0-29.9) 02/04/2022   Bacterial URI 01/07/2022   Complete left bundle branch block 09/06/2021   Anemia of chronic renal failure, stage 3a ([place]) 09/06/2021   Gastroesophageal reflux disease without esophagitis 09/06/2021   Cardiomyopathy ([place])    Chronic systolic CHF (congestive heart failure) ([place]) 08/22/2021   NSTEMI (non-ST elevated myocardial infarction) ([place]) 08/21/2021   Low immunoglobulin level    Bronchitis    Fever    Numbness and tingling 07/23/2021   Acute gingivitis 06/24/2021   URI (upper respiratory infection) 06/23/2021   Chronic diarrhea 06/23/2021   Multiple myeloma in remission ([place]) 05/14/2021   Leg swelling 04/02/2021   Neuropathy 04/02/2021   Fever in adult 03/05/2021   Frequent PVCs 12/04/2020   Diarrhea 10/25/2020   Encounter for antineoplastic chemotherapy 08/31/2020   Coronary artery disease involving native coronary artery of native heart 04/26/2020   Yeast vaginitis 03/18/2020   Hypokalemia 03/11/2020   Dyspnea  03/02/2020   Physical deconditioning 02/07/2020   Impaired nutrition 02/07/2020   Encounter for antineoplastic immunotherapy 10/03/2019   Hypocalcemia 08/28/2019   Chronic nausea 12/17/2018   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[place]) 12/17/2018   Hyperlipidemia associated with type 2 diabetes mellitus ([place]) 11/25/2018   Anemia 10/21/2018   Hypomagnesemia 08/20/2018   Goals of care, counseling/discussion 08/20/2018   B12 deficiency 08/20/2018   Thrombocytopenia ([place]) 02/09/2018   Benign essential HTN 08/21/2017   Carotid artery stenosis, asymptomatic, bilateral 08/06/2017   Thyroid nodule 08/06/2017   Iron deficiency anemia due to chronic  blood loss 05/13/2017   Spinal stenosis of lumbar region with neurogenic claudication 04/09/2017   Long term prescription opiate use 09/07/2016   Chronic pain syndrome 07/08/2016   Pain medication agreement signed 07/08/2016   Spondylosis of lumbar region without myelopathy or radiculopathy 07/08/2016   Nonrheumatic aortic valve stenosis 06/05/2016   Environmental and seasonal allergies 04/19/2016   Insomnia 04/19/2016   Asthma 04/19/2016   Aortic regurgitation 04/19/2016   Type II diabetes mellitus with complication ([place]) 49/61/1643   Depression, major, single episode, in partial remission ([place]) 04/12/2016   Irritable bowel syndrome (IBS) 04/12/2016   Hepatic steatosis 04/12/2016   Multiple myeloma not having achieved remission ([place]) 04/02/2016   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[place]) 07/06/2015   Stem cell transplant candidate 05/16/2015   PCP:  Glean Hess, MD Pharmacy:   [facility] [place], [place] [address] Phone: [phone] Fax: [phone]  [place], [place] [address] Phone: [phone] Fax: [phone]  [facility] [place], [address] [place] [address] Phone: [phone] Fax: [phone]     Social Determinants of Health ([place]) Interventions    Readmission Risk Interventions    03/06/2021    2:57 PM 10/20/2019    3:08 PM  Readmission Risk Prevention Plan  Transportation Screening Complete Complete  PCP or Specialist Appt within 3-5 Days Complete Complete  HRI or Home Care Consult Complete Complete  Social Work Consult for [place] Planning/Counseling Complete   Palliative Care Screening Not Applicable Complete  Medication Review (RN Care Manager) Referral to Pharmacy Referral to Pharmacy

## 2022-04-10 NOTE — Discharge Summary (Signed)
Physician Discharge Summary   Patient: Sarah [name] MRN: [id number] DOB: 02/20/1956  Admit date:     04/08/2022  Discharge date: 04/10/22  Discharge Physician: Oswald Hillock   PCP: Glean Hess, MD   Recommendations at discharge:   Follow-up PCP in 2 weeks  Discharge Diagnoses: Principal Problem:   CAP (community acquired pneumonia) Active Problems:   Hypomagnesemia   Multiple myeloma not having achieved remission ([place])   Type II diabetes mellitus with complication (HCC)   Hyperlipidemia associated with type 2 diabetes mellitus ([place])   Coronary artery disease involving native coronary artery of native heart   Multiple myeloma in remission (HCC)   Chronic systolic CHF (congestive heart failure) (HCC)   Pleurisy   Stage 3b chronic kidney disease (CKD) ([place])  Resolved Problems:   * No resolved hospital problems. *  Hospital Course: Sekai Nayak is a 66 y.o. female with medical history significant of multiple myeloma s/p bone marrow transplant, HFrEF with recovered EF, PVCs, CKD stage IIIa, type 2 diabetes, who presents to the ED with complaints of chest pain.   On 03/26/2022, she presented to the ED with shortness of breath and chest tightness.  She was found to be volume overloaded and treated with IV Lasix at which time she was discharged home.  She states she felt better after this visit and had followed up with her nephrologist and oncologist.  Then on Friday, 10/06, she developed productive cough with mild shortness of breath.  The symptom that was most unusual for her is severe left-sided chest pain that is exacerbated by turning positions, deep breaths, coughing and even talking.    CTA was obtained that demonstrated mild interstitial prominence and scattered groundglass opacities with trace left pleural effusion.  No evidence of PE.  She was started on IV antibiotics and admitted to the hospital.  Assessment and Plan:   Community-acquired pneumonia -Respiratory  virus panel, COVID-19 PCR, influenza PCR negative -Patient is afebrile, procalcitonin less than 0.10 -Blood cultures x2 negative to date -Patient is clinically improved, will discharge on doxycycline 100 mg p.o. twice daily for 2 more days  Pleuritic chest pain -Insetting of pleural effusion, mild and community-acquired pneumonia -Pain is more muscular in the region; will take tramadol and muscle relaxants at home -CTA chest was negative for pulmonary embolism  History of chronic osteomyelitis of the jaw -Patient has been taking doxycycline 50 mg p.o. twice daily for past 30 days -Patient says that she has 5 more days of doxycycline left before she follows with orthopedics for possible surgery -I have advised her to start taking doxycycline 50 mg p.o. twice daily after she completes 2 doses of 100 mg p.o. doxycycline as prescribed above.  Diabetes mellitus type 2 -Continue home regimen with glipizide  Chronic systolic heart failure/bicuspid aortic stenosis -Continue Entresto, Lasix -Patient has a follow-up appointment with cardiologist on 04/24/2022  CKD stage IIIb -Baseline creatinine 1.3 -Stable  Multiple myeloma -Followed by oncology as outpatient -Patient is status post pulmonary transplant  Mood disorder -Continue Cymbalta, Prozac, tramadol, trazodone       Consultants:  Procedures performed:  Disposition: Home Diet recommendation:  Discharge Diet Orders (From admission, onward)     Start     Ordered   04/10/22 0000  Diet - low sodium heart healthy        04/10/22 1024           Regular diet DISCHARGE MEDICATION: Allergies as of 04/10/2022  Reactions   Oxycodone-acetaminophen Anaphylaxis   Swelling and rash   Celebrex [celecoxib] Diarrhea   Codeine    Plerixafor    In 2016 during ASCT collection patient developed fever to 103.47F and required hospitalization   Benadryl [diphenhydramine] Palpitations   Morphine Itching, Rash   Ondansetron  Diarrhea   Tylenol [acetaminophen] Itching, Rash        Medication List     STOP taking these medications    doxycycline 50 MG capsule Commonly known as: VIBRAMYCIN Replaced by: doxycycline 100 MG tablet       TAKE these medications    acyclovir 400 MG tablet Commonly known as: ZOVIRAX Take 1 tablet (400 mg total) by mouth 2 (two) times daily.   allopurinol 100 MG tablet Commonly known as: ZYLOPRIM TAKE 1 TABLET(100 MG) BY MOUTH DAILY as needed   atorvastatin 10 MG tablet Commonly known as: Lipitor Take 0.5 tablets (5 mg total) by mouth daily.   bisoprolol 5 MG tablet Commonly known as: ZEBETA Take 1 tablet (5 mg total) by mouth daily.   clotrimazole 10 MG troche Commonly known as: MYCELEX Take 10 mg by mouth 3 (three) times daily as needed.   diclofenac sodium 1 % Gel Commonly known as: VOLTAREN Apply 2 g topically 4 (four) times daily.   diphenoxylate-atropine 2.5-0.025 MG tablet Commonly known as: LOMOTIL Take 2 tablets by mouth 4 (four) times daily as needed for diarrhea or loose stools.   doxycycline 100 MG tablet Commonly known as: VIBRA-TABS Take 1 tablet (100 mg total) by mouth every 12 (twelve) hours for 2 days. Replaces: doxycycline 50 MG capsule   DULoxetine 60 MG capsule Commonly known as: CYMBALTA TAKE 1 CAPSULE(60 MG) BY MOUTH DAILY   Entresto 24-26 MG Generic drug: sacubitril-valsartan Take 1 tablet by mouth 2 (two) times daily.   FLUoxetine 40 MG capsule Commonly known as: PROZAC TAKE 1 CAPSULE EVERY DAY   furosemide 40 MG tablet Commonly known as: LASIX Take 40 mg by mouth daily. What changed: Another medication with the same name was removed. Continue taking this medication, and follow the directions you see here.   glipiZIDE 2.5 MG 24 hr tablet Commonly known as: GLUCOTROL XL Take 1 tablet (2.5 mg total) by mouth daily with breakfast.   glucose blood test strip   guaiFENesin-dextromethorphan 100-10 MG/5ML syrup Commonly  known as: ROBITUSSIN DM Take 5 mLs by mouth every 4 (four) hours as needed for cough.   montelukast 10 MG tablet Commonly known as: Singulair Take 1 tablet (10 mg total) by mouth See admin instructions. Take 1 tab daily for 2 days after daratumumab treatments   multivitamin tablet Take 1 tablet by mouth daily.   nitroGLYCERIN 0.4 MG SL tablet Commonly known as: NITROSTAT Place 1 tablet (0.4 mg total) under the tongue every 5 (five) minutes as needed for chest pain.   omeprazole 40 MG capsule Commonly known as: PRILOSEC TAKE 1 CAPSULE (40 MG TOTAL) BY MOUTH IN THE MORNING AND AT BEDTIME.   pentoxifylline 400 MG CR tablet Commonly known as: TRENTAL Take 400 mg by mouth 3 (three) times daily.   promethazine 25 MG tablet Commonly known as: PHENERGAN Take 1 tablet (25 mg total) by mouth every 6 (six) hours as needed for nausea or vomiting.   tiZANidine 4 MG tablet Commonly known as: Zanaflex Take 1 tablet (4 mg total) by mouth every 6 (six) hours as needed for muscle spasms.   traMADol 50 MG tablet Commonly known as: ULTRAM Take 50  mg by mouth 2 (two) times daily.   traZODone 100 MG tablet Commonly known as: DESYREL TAKE 1 TABLET AT BEDTIME        Follow-up Information     Glean Hess, MD Follow up in 2 week(s).   Specialty: Internal Medicine Contact information: [address] [place] [address] [phone]                Discharge Exam: Danley Danker Weights   04/08/22 6811  Weight: 69.4 kg   General-appears in no acute distress Heart-S1-S2, regular, no murmur auscultated Lungs-clear to auscultation bilaterally, no wheezing or crackles auscultated Abdomen-soft, nontender, no organomegaly Extremities-no edema in the lower extremities Neuro-alert, oriented x3, no focal deficit noted  Condition at discharge: good  The results of significant diagnostics from this hospitalization (including imaging, microbiology, ancillary and laboratory)  are listed below for reference.   Imaging Studies: CT Angio Chest PE W and/or Wo Contrast  Result Date: 04/08/2022 CLINICAL DATA:  Chest pain EXAM: CT ANGIOGRAPHY CHEST WITH CONTRAST TECHNIQUE: Multidetector CT imaging of the chest was performed using the standard protocol during bolus administration of intravenous contrast. Multiplanar CT image reconstructions and MIPs were obtained to evaluate the vascular anatomy. RADIATION DOSE REDUCTION: This exam was performed according to the departmental dose-optimization program which includes automated exposure control, adjustment of the mA and/or kV according to patient size and/or use of iterative reconstruction technique. CONTRAST:  36m OMNIPAQUE IOHEXOL 350 MG/ML SOLN COMPARISON:  02/04/2022 FINDINGS: Cardiovascular: No filling defects in the pulmonary arteries to suggest pulmonary emboli. Heart is borderline in size. Aorta is normal caliber. Scattered aortic calcifications. Mediastinum/Nodes: Mildly prominent subcarinal lymph node measuring 10 mm, favor reactive. No hilar or axillary adenopathy. Lungs/Pleura: Small left pleural effusion. Interstitial prominence and mild ground-glass opacities in the lungs could reflect edema. Upper Abdomen: No acute findings Musculoskeletal: Chest wall soft tissues are unremarkable. No acute osseous abnormality. Review of the MIP images confirms the above findings. IMPRESSION: No evidence of pulmonary embolus. Borderline heart size. Mild interstitial prominence and scattered ground-glass opacities could reflect early interstitial edema. Trace left pleural effusion. Aortic Atherosclerosis (ICD10-I70.0). Electronically Signed   By: KRolm BaptiseM.D.   On: 04/08/2022 14:08   DG Chest 2 View  Result Date: 04/08/2022 CLINICAL DATA:  Chest pain EXAM: CHEST - 2 VIEW COMPARISON:  03/25/2022 FINDINGS: Cardiac size is within normal limits. There are no signs of alveolar pulmonary edema. There is no focal consolidation. Increased  interstitial markings are seen in left lower lung field. There is no pleural effusion or pneumothorax. Tip of right IJ chest port is noted at the junction of superior vena cava and right atrium. IMPRESSION: Linear densities in left lower lung fields suggest subsegmental atelectasis/pneumonia. Electronically Signed   By: PElmer PickerM.D.   On: 04/08/2022 10:18   DG Chest 2 View  Result Date: 03/25/2022 CLINICAL DATA:  Dyspnea EXAM: CHEST - 2 VIEW COMPARISON:  None Available. FINDINGS: The lungs are symmetrically well expanded. No pneumothorax or pleural effusion. There is mild perihilar and lower lung zone interstitial thickening most in keeping with trace interstitial pulmonary edema. Right internal jugular chest port tip is seen at the superior cavoatrial junction. Cardiac size within normal limits. Osseous structures are age-appropriate. No acute bone abnormality. IMPRESSION: Trace interstitial pulmonary edema. Electronically Signed   By: AFidela SalisburyM.D.   On: 03/25/2022 22:00    Microbiology: Results for orders placed or performed during the hospital encounter of 04/08/22  Blood  culture (routine x 2)     Status: None (Preliminary result)   Collection Time: 04/08/22 12:06 PM   Specimen: BLOOD  Result Value Ref Range Status   Specimen Description BLOOD LEFT ANTECUBITAL  Final   Special Requests   Final    BOTTLES DRAWN AEROBIC AND ANAEROBIC Blood Culture adequate volume   Culture   Final    NO GROWTH 2 DAYS Performed at [facility], [address] [place], [place] [address]    Report Status PENDING  Incomplete  Blood culture (routine x 2)     Status: None (Preliminary result)   Collection Time: 04/08/22 12:12 PM   Specimen: BLOOD RIGHT HAND  Result Value Ref Range Status   Specimen Description BLOOD RIGHT HAND  Final   Special Requests   Final    BOTTLES DRAWN AEROBIC AND ANAEROBIC Blood Culture adequate volume   Culture   Final    NO GROWTH 2 DAYS Performed at  [facility], [address] [place],  [address]    Report Status PENDING  Incomplete  Resp Panel by RT-PCR (Flu A&B, Covid) Anterior Nasal Swab     Status: None   Collection Time: 04/08/22  1:31 PM   Specimen: Anterior Nasal Swab  Result Value Ref Range Status   SARS Coronavirus 2 by RT PCR NEGATIVE NEGATIVE Final    Comment: (NOTE) SARS-CoV-2 target nucleic acids are NOT DETECTED.  The SARS-CoV-2 RNA is generally detectable in upper respiratory specimens during the acute phase of infection. The lowest concentration of SARS-CoV-2 viral copies this assay can detect is 138 copies/mL. A negative result does not preclude SARS-Cov-2 infection and should not be used as the sole basis for treatment or other patient management decisions. A negative result may occur with  improper specimen collection/handling, submission of specimen other than nasopharyngeal swab, presence of viral mutation(s) within the areas targeted by this assay, and inadequate number of viral copies(<138 copies/mL). A negative result must be combined with clinical observations, patient history, and epidemiological information. The expected result is Negative.  Fact Sheet for Patients:  EntrepreneurPulse.com.au  Fact Sheet for Healthcare Providers:  IncredibleEmployment.be  This test is no t yet approved or cleared by the [place] FDA and  has been authorized for detection and/or diagnosis of SARS-CoV-2 by FDA under an Emergency Use Authorization (EUA). This EUA will remain  in effect (meaning this test can be used) for the duration of the COVID-19 declaration under Section 564(b)(1) of the Act, 21 U.S.C.section 360bbb-3(b)(1), unless the authorization is terminated  or revoked sooner.       Influenza A by PCR NEGATIVE NEGATIVE Final   Influenza B by PCR NEGATIVE NEGATIVE Final    Comment: (NOTE) The Xpert Xpress SARS-CoV-2/FLU/RSV plus assay is intended  as an aid in the diagnosis of influenza from Nasopharyngeal swab specimens and should not be used as a sole basis for treatment. Nasal washings and aspirates are unacceptable for Xpert Xpress SARS-CoV-2/FLU/RSV testing.  Fact Sheet for Patients: EntrepreneurPulse.com.au  Fact Sheet for Healthcare Providers: IncredibleEmployment.be  This test is not yet approved or cleared by the [place] FDA and has been authorized for detection and/or diagnosis of SARS-CoV-2 by FDA under an Emergency Use Authorization (EUA). This EUA will remain in effect (meaning this test can be used) for the duration of the COVID-19 declaration under Section 564(b)(1) of the Act, 21 U.S.C. section 360bbb-3(b)(1), unless the authorization is terminated or revoked.  Performed at [facility], [address] [place], [place] [address]   Respiratory (~20 pathogens) panel by PCR     Status: None   Collection Time: 04/09/22  4:27 AM   Specimen: Nasopharyngeal Swab; Respiratory  Result Value Ref Range Status   Adenovirus NOT DETECTED NOT DETECTED Final   Coronavirus 229E NOT DETECTED NOT DETECTED Final    Comment: (NOTE) The Coronavirus on the Respiratory Panel, DOES NOT test for the novel  Coronavirus (2019 nCoV)    Coronavirus HKU1 NOT DETECTED NOT DETECTED Final   Coronavirus NL63 NOT DETECTED NOT DETECTED Final   Coronavirus OC43 NOT DETECTED NOT DETECTED Final   Metapneumovirus NOT DETECTED NOT DETECTED Final   Rhinovirus / Enterovirus NOT DETECTED NOT DETECTED Final   Influenza A NOT DETECTED NOT DETECTED Final   Influenza B NOT DETECTED NOT DETECTED Final   Parainfluenza Virus 1 NOT DETECTED NOT DETECTED Final   Parainfluenza Virus 2 NOT DETECTED NOT DETECTED Final   Parainfluenza Virus 3 NOT DETECTED NOT DETECTED Final   Parainfluenza Virus 4 NOT DETECTED NOT DETECTED Final   Respiratory Syncytial Virus NOT DETECTED NOT DETECTED Final   Bordetella  pertussis NOT DETECTED NOT DETECTED Final   Bordetella Parapertussis NOT DETECTED NOT DETECTED Final   Chlamydophila pneumoniae NOT DETECTED NOT DETECTED Final   Mycoplasma pneumoniae NOT DETECTED NOT DETECTED Final    Comment: Performed at [facility], [place] [place], [place] [address]   *Note: Due to a large number of results and/or encounters for the requested time period, some results have not been displayed. A complete set of results can be found in Results Review.    Labs: CBC: Recent Labs  Lab 04/08/22 0943 04/09/22 0427  WBC 9.5 8.5  NEUTROABS  --  6.8  HGB 10.1* 9.7*  HCT 31.3* 31.7*  MCV 77.3* 80.5  PLT 135* 098*   Basic Metabolic Panel: Recent Labs  Lab 04/04/22 0839 04/04/22 0939 04/08/22 0821 04/08/22 0943 04/09/22 0427 04/10/22 0550  NA  --   --   --  137 136 136  K  --   --  4.0 4.5 4.1 3.6  CL  --   --   --  107 103 105  CO2  --   --   --  _0 GLUCOSE  --   --   --  185* 134* 121*  BUN  --   --   --  30* 29* 29*  CREATININE  --   --   --  1.27* 1.48* 1.29*  CALCIUM  --   --   --  9.3 8.8* 8.4*  MG 2.2 2.2 1.0*  --  2.2 1.6*   Liver Function Tests: Recent Labs  Lab 04/09/22 0427  AST 34  ALT 27  ALKPHOS 87  BILITOT 0.5  PROT 6.8  ALBUMIN 3.8   CBG: Recent Labs  Lab 04/09/22 0836 04/09/22 1127 04/09/22 1604 04/09/22 2001 04/10/22 0744  GLUCAP 131* 145* 111* 136* 116*    Discharge time spent: greater than 30 minutes.  Signed: Oswald Hillock, MD [facility] 04/10/2022

## 2022-04-10 NOTE — Discharge Instructions (Signed)
Take doxycycline 100 mg p.o. twice daily for 2 days, then after completing this doxycycline you can start back on doxycycline 50 mg p.o. twice daily as prescribed per orthopedics

## 2022-04-11 ENCOUNTER — Telehealth: Payer: Self-pay | Admitting: *Deleted

## 2022-04-11 NOTE — Patient Outreach (Signed)
  Care Coordination TOC Note Transition Care Management Follow-up Telephone Call Date of discharge and from where: a little sore  How have you been since you were released from the hospital? I am a little sore and I have a cough Any questions or concerns? No  Items Reviewed: Did the pt receive and understand the discharge instructions provided? Yes  Medications obtained and verified? Yes  Other? No  Any new allergies since your discharge? No  Dietary orders reviewed? No Do you have support at home? Yes   Home Care and Equipment/Supplies: Were home health services ordered? no If so, what is the name of the agency? N/a  Has the agency set up a time to come to the patient's home? not applicable Were any new equipment or medical supplies ordered?  No What is the name of the medical supply agency? N/a Were you able to get the supplies/equipment? not applicable Do you have any questions related to the use of the equipment or supplies? No  Functional Questionnaire: (I = Independent and D = Dependent) ADLs: I  Bathing/Dressing- I  Meal Prep- I  Eating- I  Maintaining continence- I  Transferring/Ambulation- I  Managing Meds- I  Follow up appointments reviewed:  PCP Hospital f/u appt confirmed? No  . [facility] f/u appt confirmed? Yes  Scheduled to see Oncology [phone] 8:00 . Are transportation arrangements needed? No  If their condition worsens, is the pt aware to call PCP or go to the Emergency Dept.? Yes Was the patient provided with contact information for the PCP's office or ED? Yes Was to pt encouraged to call back with questions or concerns? Yes  SDOH assessments and interventions completed:   Yes  Care Coordination Interventions Activated:  Yes   Care Coordination Interventions:  PCP follow up appointment requested   Encounter Outcome:  Pt. Visit Completed   [facility] [phone]

## 2022-04-12 ENCOUNTER — Other Ambulatory Visit: Payer: Self-pay

## 2022-04-12 DIAGNOSIS — C9000 Multiple myeloma not having achieved remission: Secondary | ICD-10-CM

## 2022-04-13 LAB — CULTURE, BLOOD (ROUTINE X 2)
Culture: NO GROWTH
Culture: NO GROWTH
Special Requests: ADEQUATE
Special Requests: ADEQUATE

## 2022-04-15 ENCOUNTER — Inpatient Hospital Stay: Payer: Medicare HMO

## 2022-04-15 VITALS — BP 106/58 | HR 76 | Temp 97.1°F | Resp 18

## 2022-04-15 DIAGNOSIS — D509 Iron deficiency anemia, unspecified: Secondary | ICD-10-CM | POA: Diagnosis not present

## 2022-04-15 DIAGNOSIS — C9001 Multiple myeloma in remission: Secondary | ICD-10-CM | POA: Diagnosis not present

## 2022-04-15 DIAGNOSIS — C9000 Multiple myeloma not having achieved remission: Secondary | ICD-10-CM

## 2022-04-15 DIAGNOSIS — I509 Heart failure, unspecified: Secondary | ICD-10-CM

## 2022-04-15 DIAGNOSIS — K219 Gastro-esophageal reflux disease without esophagitis: Secondary | ICD-10-CM | POA: Diagnosis not present

## 2022-04-15 DIAGNOSIS — E538 Deficiency of other specified B group vitamins: Secondary | ICD-10-CM | POA: Diagnosis not present

## 2022-04-15 DIAGNOSIS — Z515 Encounter for palliative care: Secondary | ICD-10-CM | POA: Diagnosis not present

## 2022-04-15 DIAGNOSIS — R058 Other specified cough: Secondary | ICD-10-CM | POA: Diagnosis not present

## 2022-04-15 DIAGNOSIS — R071 Chest pain on breathing: Secondary | ICD-10-CM | POA: Diagnosis not present

## 2022-04-15 DIAGNOSIS — I252 Old myocardial infarction: Secondary | ICD-10-CM | POA: Diagnosis not present

## 2022-04-15 LAB — BASIC METABOLIC PANEL
Anion gap: 9 (ref 5–15)
BUN: 38 mg/dL — ABNORMAL HIGH (ref 8–23)
CO2: 23 mmol/L (ref 22–32)
Calcium: 8.3 mg/dL — ABNORMAL LOW (ref 8.9–10.3)
Chloride: 102 mmol/L (ref 98–111)
Creatinine, Ser: 1.46 mg/dL — ABNORMAL HIGH (ref 0.44–1.00)
GFR, Estimated: 40 mL/min — ABNORMAL LOW (ref >60)
Glucose, Bld: 145 mg/dL — ABNORMAL HIGH (ref 70–99)
Potassium: 3.6 mmol/L (ref 3.5–5.1)
Sodium: 134 mmol/L — ABNORMAL LOW (ref 135–145)

## 2022-04-15 LAB — CBC WITH DIFFERENTIAL/PLATELET
Abs Immature Granulocytes: 0.03 10*3/uL (ref 0.00–0.07)
Basophils Absolute: 0 10*3/uL (ref 0.0–0.1)
Basophils Relative: 0 %
Eosinophils Absolute: 0.2 10*3/uL (ref 0.0–0.5)
Eosinophils Relative: 3 %
HCT: 33.8 % — ABNORMAL LOW (ref 36.0–46.0)
Hemoglobin: 11 g/dL — ABNORMAL LOW (ref 12.0–15.0)
Immature Granulocytes: 0 %
Lymphocytes Relative: 20 %
Lymphs Abs: 1.5 10*3/uL (ref 0.7–4.0)
MCH: 25.3 pg — ABNORMAL LOW (ref 26.0–34.0)
MCHC: 32.5 g/dL (ref 30.0–36.0)
MCV: 77.9 fL — ABNORMAL LOW (ref 80.0–100.0)
Monocytes Absolute: 0.7 10*3/uL (ref 0.1–1.0)
Monocytes Relative: 9 %
Neutro Abs: 5.1 10*3/uL (ref 1.7–7.7)
Neutrophils Relative %: 68 %
Platelets: 126 10*3/uL — ABNORMAL LOW (ref 150–400)
RBC: 4.34 MIL/uL (ref 3.87–5.11)
RDW: 16.8 % — ABNORMAL HIGH (ref 11.5–15.5)
WBC: 7.6 10*3/uL (ref 4.0–10.5)
nRBC: 0 % (ref 0.0–0.2)

## 2022-04-15 LAB — MAGNESIUM: Magnesium: 0.9 mg/dL — CL (ref 1.7–2.4)

## 2022-04-15 MED ORDER — SODIUM CHLORIDE 0.9 % IV SOLN
200.0000 mg | Freq: Once | INTRAVENOUS | Status: AC
  Administered 2022-04-15: 200 mg via INTRAVENOUS
  Filled 2022-04-15: qty 200

## 2022-04-15 MED ORDER — SODIUM CHLORIDE 0.9 % IV SOLN: 6.0000 g | Freq: Once | INTRAVENOUS | Status: DC

## 2022-04-15 MED ORDER — MAGNESIUM SULFATE 4 GM/100ML IV SOLN
4.0000 g | Freq: Once | INTRAVENOUS | Status: AC
  Administered 2022-04-15: 4 g via INTRAVENOUS
  Filled 2022-04-15: qty 100

## 2022-04-15 MED ORDER — SODIUM CHLORIDE 0.9 % IV SOLN
25.0000 mg | Freq: Once | INTRAVENOUS | Status: AC
  Administered 2022-04-15: 25 mg via INTRAVENOUS
  Filled 2022-04-15: qty 1

## 2022-04-15 MED ORDER — MAGNESIUM SULFATE 2 GM/50ML IV SOLN
2.0000 g | Freq: Once | INTRAVENOUS | Status: AC
  Administered 2022-04-15: 2 g via INTRAVENOUS
  Filled 2022-04-15: qty 50

## 2022-04-15 MED ORDER — SODIUM CHLORIDE 0.9 % IV SOLN
Freq: Once | INTRAVENOUS | Status: AC
  Filled 2022-04-15: qty 250

## 2022-04-15 MED ORDER — HEPARIN SOD (PORK) LOCK FLUSH 100 UNIT/ML IV SOLN
INTRAVENOUS | Status: AC
  Filled 2022-04-15: qty 5

## 2022-04-15 NOTE — Progress Notes (Signed)
Magnesium 0.9 today.  Given magnesium 6 grams IV per Dr. Tasia Catchings.

## 2022-04-15 NOTE — Patient Instructions (Signed)
[facility] AT [place]  Discharge Instructions: Thank you for choosing [facility] to provide your oncology and hematology care.  If you have a lab appointment with the [facility], please go directly to the [facility] and check in at the registration area.  Wear comfortable clothing and clothing appropriate for easy access to any Portacath or PICC line.   We strive to give you quality time with your provider. You may need to reschedule your appointment if you arrive late (15 or more minutes).  Arriving late affects you and other patients whose appointments are after yours.  Also, if you miss three or more appointments without notifying the office, you may be dismissed from the clinic at the provider's discretion.      For prescription refill requests, have your pharmacy contact our office and allow 72 hours for refills to be completed.    Today you received the following chemotherapy and/or immunotherapy agents venofer, IV magnesium, phenergan      To help prevent nausea and vomiting after your treatment, we encourage you to take your nausea medication as directed.  BELOW ARE SYMPTOMS THAT SHOULD BE REPORTED IMMEDIATELY: *FEVER GREATER THAN 100.4 F (38 C) OR HIGHER *CHILLS OR SWEATING *NAUSEA AND VOMITING THAT IS NOT CONTROLLED WITH YOUR NAUSEA MEDICATION *UNUSUAL SHORTNESS OF BREATH *UNUSUAL BRUISING OR BLEEDING *URINARY PROBLEMS (pain or burning when urinating, or frequent urination) *BOWEL PROBLEMS (unusual diarrhea, constipation, pain near the anus) TENDERNESS IN MOUTH AND THROAT WITH OR WITHOUT PRESENCE OF ULCERS (sore throat, sores in mouth, or a toothache) UNUSUAL RASH, SWELLING OR PAIN  UNUSUAL VAGINAL DISCHARGE OR ITCHING   Items with * indicate a potential emergency and should be followed up as soon as possible or go to the Emergency Department if any problems should occur.  Please show the CHEMOTHERAPY ALERT CARD or IMMUNOTHERAPY  ALERT CARD at check-in to the Emergency Department and triage nurse.  Should you have questions after your visit or need to cancel or reschedule your appointment, please contact [facility] [place] [facility] [place]  [phone] and follow the prompts.  Office hours are 8:00 a.m. to 4:30 p.m. Monday - Friday. Please note that voicemails left after 4:00 p.m. may not be returned until the following business day.  We are closed weekends and major holidays. You have access to a nurse at all times for urgent questions. Please call the main number to the clinic [phone] and follow the prompts.  For any non-urgent questions, you may also contact your provider using MyChart. We now offer e-Visits for anyone 59 and older to request care online for non-urgent symptoms. For details visit [email or web address].   Also download the MyChart app! Go to the app store, search "MyChart", open the app, select [facility], and log in with your MyChart username and password.  Masks are optional in the cancer centers. If you would like for your care team to wear a mask while they are taking care of you, please let them know. For doctor visits, patients may have with them one support person who is at least 65 years old. At this time, visitors are not allowed in the infusion area.

## 2022-04-17 ENCOUNTER — Telehealth: Payer: Self-pay | Admitting: *Deleted

## 2022-04-17 DIAGNOSIS — I493 Ventricular premature depolarization: Secondary | ICD-10-CM | POA: Diagnosis not present

## 2022-04-17 DIAGNOSIS — I350 Nonrheumatic aortic (valve) stenosis: Secondary | ICD-10-CM | POA: Diagnosis not present

## 2022-04-17 DIAGNOSIS — I5022 Chronic systolic (congestive) heart failure: Secondary | ICD-10-CM | POA: Diagnosis not present

## 2022-04-17 NOTE — Telephone Encounter (Signed)
A user error has taken place: encounter opened in error, closed for administrative reasons staff mg sent back to [place].Marland KitchenAndee Poles.

## 2022-04-18 ENCOUNTER — Telehealth: Payer: Self-pay

## 2022-04-18 NOTE — Telephone Encounter (Signed)
-----   Message from [facility], [place] sent at 04/17/2022  1:06 PM EDT ----- Regarding: FW: F/U PCP appt Good afternoon,   Patient is in need of a Hospital follow up. Can you assist getting patient scheduled?  ----- Message ----- From: Verlin Grills, RN Sent: 04/11/2022  10:43 AM EDT To: Aviva Signs, CMA Subject: F/U PCP appt                                   Good morning:  Please schedule f/u PCP appt   Pt Sarah [name] DOB [id number]  MRN [id number]  Dr Halina Maidens [facility] [phone]

## 2022-04-18 NOTE — Telephone Encounter (Signed)
Called and spoke to patient to schedule for hospital follow up. Patient Sarah [name] unable to come in by 04/24/22 ( 14 day window ) due to other appointment she needs to attend. She has a hospital follow up appt with her cardiologist on 04/24/2022 so she will just keep that appointment at this time.  - Layken Beg

## 2022-04-19 MED FILL — Iron Sucrose Inj 20 MG/ML (Fe Equiv): INTRAVENOUS | Qty: 10 | Status: AC

## 2022-04-22 ENCOUNTER — Inpatient Hospital Stay: Payer: Medicare HMO

## 2022-04-22 ENCOUNTER — Inpatient Hospital Stay (HOSPITAL_BASED_OUTPATIENT_CLINIC_OR_DEPARTMENT_OTHER): Payer: Medicare HMO | Admitting: Oncology

## 2022-04-22 ENCOUNTER — Encounter: Payer: Self-pay | Admitting: Oncology

## 2022-04-22 ENCOUNTER — Other Ambulatory Visit: Payer: Self-pay | Admitting: Oncology

## 2022-04-22 DIAGNOSIS — Z515 Encounter for palliative care: Secondary | ICD-10-CM | POA: Diagnosis not present

## 2022-04-22 DIAGNOSIS — C9001 Multiple myeloma in remission: Secondary | ICD-10-CM

## 2022-04-22 DIAGNOSIS — D500 Iron deficiency anemia secondary to blood loss (chronic): Secondary | ICD-10-CM

## 2022-04-22 DIAGNOSIS — G629 Polyneuropathy, unspecified: Secondary | ICD-10-CM | POA: Diagnosis not present

## 2022-04-22 DIAGNOSIS — D509 Iron deficiency anemia, unspecified: Secondary | ICD-10-CM | POA: Diagnosis not present

## 2022-04-22 DIAGNOSIS — E538 Deficiency of other specified B group vitamins: Secondary | ICD-10-CM | POA: Diagnosis not present

## 2022-04-22 DIAGNOSIS — C9000 Multiple myeloma not having achieved remission: Secondary | ICD-10-CM

## 2022-04-22 DIAGNOSIS — K219 Gastro-esophageal reflux disease without esophagitis: Secondary | ICD-10-CM | POA: Diagnosis not present

## 2022-04-22 DIAGNOSIS — R771 Abnormality of globulin: Secondary | ICD-10-CM | POA: Diagnosis not present

## 2022-04-22 DIAGNOSIS — R071 Chest pain on breathing: Secondary | ICD-10-CM | POA: Diagnosis not present

## 2022-04-22 DIAGNOSIS — I252 Old myocardial infarction: Secondary | ICD-10-CM | POA: Diagnosis not present

## 2022-04-22 DIAGNOSIS — R058 Other specified cough: Secondary | ICD-10-CM | POA: Diagnosis not present

## 2022-04-22 LAB — COMPREHENSIVE METABOLIC PANEL
ALT: 31 U/L (ref 0–44)
AST: 35 U/L (ref 15–41)
Albumin: 3.9 g/dL (ref 3.5–5.0)
Alkaline Phosphatase: 80 U/L (ref 38–126)
Anion gap: 10 (ref 5–15)
BUN: 22 mg/dL (ref 8–23)
CO2: 23 mmol/L (ref 22–32)
Calcium: 8.4 mg/dL — ABNORMAL LOW (ref 8.9–10.3)
Chloride: 102 mmol/L (ref 98–111)
Creatinine, Ser: 1.11 mg/dL — ABNORMAL HIGH (ref 0.44–1.00)
GFR, Estimated: 55 mL/min — ABNORMAL LOW (ref >60)
Glucose, Bld: 137 mg/dL — ABNORMAL HIGH (ref 70–99)
Potassium: 3.8 mmol/L (ref 3.5–5.1)
Sodium: 135 mmol/L (ref 135–145)
Total Bilirubin: 0.3 mg/dL (ref 0.3–1.2)
Total Protein: 6.9 g/dL (ref 6.5–8.1)

## 2022-04-22 LAB — CBC WITH DIFFERENTIAL/PLATELET
Abs Immature Granulocytes: 0.03 10*3/uL (ref 0.00–0.07)
Basophils Absolute: 0 10*3/uL (ref 0.0–0.1)
Basophils Relative: 1 %
Eosinophils Absolute: 0.1 10*3/uL (ref 0.0–0.5)
Eosinophils Relative: 2 %
HCT: 33.1 % — ABNORMAL LOW (ref 36.0–46.0)
Hemoglobin: 10.6 g/dL — ABNORMAL LOW (ref 12.0–15.0)
Immature Granulocytes: 1 %
Lymphocytes Relative: 18 %
Lymphs Abs: 1.1 10*3/uL (ref 0.7–4.0)
MCH: 26.1 pg (ref 26.0–34.0)
MCHC: 32 g/dL (ref 30.0–36.0)
MCV: 81.5 fL (ref 80.0–100.0)
Monocytes Absolute: 0.4 10*3/uL (ref 0.1–1.0)
Monocytes Relative: 7 %
Neutro Abs: 4.5 10*3/uL (ref 1.7–7.7)
Neutrophils Relative %: 71 %
Platelets: 127 10*3/uL — ABNORMAL LOW (ref 150–400)
RBC: 4.06 MIL/uL (ref 3.87–5.11)
RDW: 17.9 % — ABNORMAL HIGH (ref 11.5–15.5)
WBC: 6.2 10*3/uL (ref 4.0–10.5)
nRBC: 0 % (ref 0.0–0.2)

## 2022-04-22 LAB — MAGNESIUM: Magnesium: 1 mg/dL — ABNORMAL LOW (ref 1.7–2.4)

## 2022-04-22 MED ORDER — MAGNESIUM SULFATE 2 GM/50ML IV SOLN
2.0000 g | Freq: Once | INTRAVENOUS | Status: AC
  Administered 2022-04-22: 2 g via INTRAVENOUS
  Filled 2022-04-22: qty 50

## 2022-04-22 MED ORDER — SODIUM CHLORIDE 0.9 % IV SOLN
200.0000 mg | Freq: Once | INTRAVENOUS | Status: AC
  Administered 2022-04-22: 200 mg via INTRAVENOUS
  Filled 2022-04-22: qty 200

## 2022-04-22 MED ORDER — HEPARIN SOD (PORK) LOCK FLUSH 100 UNIT/ML IV SOLN
500.0000 [IU] | Freq: Once | INTRAVENOUS | Status: AC | PRN
  Administered 2022-04-22: 500 [IU]
  Filled 2022-04-22: qty 5

## 2022-04-22 MED ORDER — SODIUM CHLORIDE 0.9 % IV SOLN: 6.0000 g | Freq: Once | INTRAVENOUS | Status: DC

## 2022-04-22 MED ORDER — SODIUM CHLORIDE 0.9 % IV SOLN
Freq: Once | INTRAVENOUS | Status: AC
  Filled 2022-04-22: qty 250

## 2022-04-22 MED ORDER — CYANOCOBALAMIN 1000 MCG/ML IJ SOLN
1000.0000 ug | Freq: Once | INTRAMUSCULAR | Status: AC
  Administered 2022-04-22: 1000 ug via INTRAMUSCULAR
  Filled 2022-04-22: qty 1

## 2022-04-22 MED ORDER — MAGNESIUM SULFATE 4 GM/100ML IV SOLN
4.0000 g | Freq: Once | INTRAVENOUS | Status: AC
  Administered 2022-04-22: 4 g via INTRAVENOUS
  Filled 2022-04-22: qty 100

## 2022-04-22 MED ORDER — SODIUM CHLORIDE 0.9 % IV SOLN
25.0000 mg | Freq: Once | INTRAVENOUS | Status: AC
  Administered 2022-04-22: 25 mg via INTRAVENOUS
  Filled 2022-04-22: qty 1

## 2022-04-22 NOTE — Assessment & Plan Note (Signed)
Continue B12 monthly injection.  

## 2022-04-22 NOTE — Addendum Note (Signed)
Addended by: Earlie Server on: 04/22/2022 02:48 PM   Modules accepted: Orders

## 2022-04-22 NOTE — Assessment & Plan Note (Signed)
S/p Venofer weekly x 3.  Will arrange her to get another dose.

## 2022-04-22 NOTE — Assessment & Plan Note (Addendum)
#  Chronic hypomagnesemia proceed with  IV magnesium today.  Continue weekly magnesium +/- IV magnesium.    

## 2022-04-22 NOTE — Assessment & Plan Note (Signed)
PRN IVIG infusion if IgG level < 500. 

## 2022-04-22 NOTE — Assessment & Plan Note (Signed)
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  Light chain ratio is normal. 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 Repeat immunofixation negative. . Labs reviewed and discussed with patient, stable M protein and light chain ratio.  Daratumumab on hold due to frequent infections despite IVIG Continue acyclovir prophylaxis.

## 2022-04-22 NOTE — Progress Notes (Signed)
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  Light chain ratio is normal. 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 Repeat immunofixation negative. . Labs reviewed and discussed with patient, stable M protein and light chain ratio.  Daratumumab on hold due to frequent infections despite IVIG Continue acyclovir prophylaxis.    B12 deficiency Continue B12 monthly injection.   Hypoglobulinemia PRN IVIG infusion if IgG level < 500.  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   Previously on Lyrica and nortriptyline, she declined due to didn't notice much improvement   Neurology recommend Tramadol and may consider higher dose of  Nortriptyline in the future. Follows up with neurology.  Iron deficiency anemia due to chronic blood loss S/p Venofer weekly x 3.  Will arrange her to get another dose.   Hypomagnesemia #Chronic hypomagnesemia proceed with  IV magnesium today.  Continue weekly magnesium +/- IV magnesium.     Orders Placed This Encounter  Procedures   Magnesium    Standing Status:   Standing    Number of Occurrences:   15    Standing Expiration Date:   04/23/2023   Follow up  IV mag today, Vneofer x 1 next available.  Lab Mag weekly x3  + IV mag.  4 weeks flex lab MD IV mag + B12 inj-same labs, All questions were answered. The patient knows to call the clinic with any problems, questions or concerns.  Sarah Server, MD, PhD [facility] Hematology Oncology 04/22/2022    Chief Complaint: Sarah [name] is a 66 y.o. female with lambda  light chain multiple myeloma s/p autologous stem cell transplant (2016 and 2021) who is seen for follow up .    PERTINENT ONCOLOGY HISTORY Sarah [name] is a 66 y.o.afemale who has above oncology history reviewed by me today presented for follow up visit for management of multiple myeloma Patient previously followed up by Dr.Corcoran, patient switched care to me on 11/23/20 Extensive medical record review was performed by me  stage III IgA lambda light chain multiple myeloma s/p autologous stem cell transplant on 06/14/2015 at the [facility] and second autologous stem cell transplant on 05/10/2020 at [facility].   Initial bone marrow revealed 80% plasma cells.  Lambda free light chains were 1340.  She had nephrotic range proteinuria.  She initially underwent induction with RVD.  Revlimid maintenance was discontinued on 01/21/2017 secondary to intolerance.     07/19/2019 Bone marrow aspirate and biopsy on  revealed a normocellular marrow with but increased lambda-restricted plasma cells (9% aspirate, 40% CD138 immunohistochemistry).  Findings were consistent with recurrent plasma cell myeloma.  Flow cytometry revealed no monoclonal B-cell or phenotypically aberrant T-cell population. Cytogenetics were 61, XX (normal).  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    Lambda light chains have been followed: 22.2 (ratio 0.56) on 07/03/2017, 30.8 (ratio 0.78) on 09/02/2017, 36.9 (ratio 0.40) on 10/21/2017, 37.4 (ratio 0.41) on 12/16/2017, 70.7(ratio 0.31)  on 02/17/2018, 64.2 (ratio 0.27) on 04/07/2018, 78.9 (ratio 0.18) on 05/26/2018, 128.8 (ratio 0.17) on 08/06/2018, 181.5 (ratio 0.13) on 10/08/2018, 130.9 (ratio 0.13) on 10/20/2018, 160.7 (ratio 0.10) on 12/09/2018, 236.6 (ratio  0.07) on 02/01/2019, 363.6 (ratio 0.04) on 03/22/2019, 404.8 (ratio 0.04) on 04/05/2019, 420.7 (ratio 0.03) on 05/24/2019, 573.4 (ratio 0.03) on 06/23/2019, 451.05 (ratio 0.02) on 08/20/2019, 47.2 (ratio 0.13) on  10/25/2019, 22.4 (ratio 0.21) on 11/25/2019, 16.5 (ratio 0.33) on 01/10/2020, 14.6 (ratio 0.34) on 02/07/2020, 13.1 (ratio 0.31) on 03/07/2020, 10.1 (ratio 0.38) on 04/10/2020, and 9.5 (ratio 0.21) on 06/19/2020.   24 hour UPEP on 06/03/2019 revealed kappa free light chains 95.76, lambda free light chains 1,260.71, and ratio 0.08.  24 hour UPEP on 08/23/2019 revealed total protein of 782 mg/24 hrs with lambda free light chains 1,084.16 mg/L and ratio of 0.10 (1.03-31.76).  M spike in urine was 46.1% (361 mg/24 hrs).    Bone survey on 04/08/2016 and 05/28/2017 revealed no definite lytic lesion seen in the visualized skeleton.  Bone survey on 11/19/2018 revealed no suspicious lucent lesions and no acute bony abnormality.  PET scan on 07/12/2019 revealed no focal metabolic activity to suggest active myeloma within the skeleton. There were no lytic lesions identified on the CT portion of the exam or soft tissue plasmacytomas. There was no evidence of multiple myeloma.    Pretreatment RBC phenotype on 09/23/2019 was positive for C, e, DUFFY B, KIDD B, M, S, and s antigen; negative for c, E, KELL, DUFFY A, KIDD A, and N antigen.    09/27/2019 - 10/25/2019; 12/09/2019 - 03/13/2020 6 cycles of daratumumab and hyaluronidase-fihj, Pomalyst, and Decadron (DPd) .  Cycle #1 was complicated by fever and neutropenia requiring admission.  Cycle #2 was complicated by pneumonia requiring admission.  Cycle #6 was complicated with an ER evaluation for an elevated lactic acid.   05/10/2020 second autologous stem cell transplant at [facility]   She underwent conditioning with melphalan 140 mg/m2.    She is s/p week #3 Velcade maintenance (began 08/31/2020 - 09/28/2020).  She has no increase in neuropathy.  She has severe aortic stenosis.  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 Cardiology is following for a possible TAVR in the future.  Echo on  07/05/2020 revealed moderate to severe aortic stenosis with an EF of 50-55%.  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 Zometa was discontinued in 01/2017.  She has chronic nausea on Phenergan.     B12 deficiency.  B12 was 254 on 04/09/2017, 295 on 08/20/2018, and 391 on 10/08/2018.  She was on oral B12.  She received B12 monthly (last 09/14/2020).  Folate was 12.1 on 01/10/2020.    She has iron deficiency.  Ferritin was 32 on 07/01/2019.  She received Venofer on 07/15/2019 and 07/22/2019.   She has hypogammaglobulinemia.  IgG was 245 on 11/25/2019.  She received monthly IVIG (07/22/021 - 03/22/2020).  She received IVIG 400 mg/kg on 01/20/2020, 200 mg/kg on 02/17/2020, and 300 mg/kg on 03/22/2020.  IVIG on 29/79/8921 was complicated by acute renal failure. IgG trough level was 418 on 02/17/2020.  10/15/2019 - 10/20/2019 She was admitted to [facility] with fever and neutropenia.  Cultures were negative.  CXR was negative. She received broad spectrum antibiotics and daily Granix.  She received IVF for acute renal insufficiency due to diarrhea and dehydration.  Creatine was 1.66 on admission and 1.12 on discharge.    03/01/2020 - 03/05/2020 admitted to [facility] from with fever and neutropenia. CXR revealed no active cardiopulmonary disease. Chest CT with contrast revealed no acute intrathoracic pathology. There were findings which could be  suggestive of prior granulomatous disease. She was treated with Cefepime and Vancomycin, then switched to ciprofloxacin on 03/03/2020.   05/08/2020 - 12/05/2021The patient was admitted to [facility] from  for autologous bone marrow transplant.   She received conditioning with melphalan 140 mg/m2.  Bone marrow transplant was on 05/10/2020. The patient developed fevers daily from 05/19/2020 - 05/24/2020. Blood cultures were + for strept sanguis bacteremia.  She was treated cefepime, vancomycin, and solumedrol for possible engraftment syndrome. Cefepime was  switched to ceftriaxone; she completed 2 weeks of ceftriaxone on 06/03/2020. She had chemotherapy induced diarrhea.  She experienced urinary retention.  Feb 2022 patient has been on maintenance Velcade 2 mg every 2 weeks.  Chronic diarrhea seen by gastroenterology Dr. Vicente Males and was recommended to avoid artificial sugars in her diet including Diet Coke and coffee mate.  She feels some improvement of her diarrhea frequency since the dietary modification.  GI profile PCR negative, C. difficile toxin A+ B negative, fecal calprotectin 77 12/19/2020 colonoscopy showed 2 subcentimeter polyps in the ascending colon, resected and removed.  Pathology showed tubular adenoma.  No malignancy.  Normal mucosa in the entire examined colon.  Biopsied, pathology showed colonic mucosa with no significant pathological alteration.  Negative for active inflammation and features of chronicity.  Negative for microscopic colitis, dysplasia, and malignanc  Diabetes, patient has discontinued metformin due to diarrhea and started on glipizide 2.5 mg   03/05/2021-03/09/2021 Patient was hospitalized due to fever and chills, nonproductive cough, shortness of breath. X-ray showed right lower lobe atelectasis versus pneumonia.  Blood cultures negative.  Urine culture showed 60,000 colonies of Enterococcus facialis.  Patient received antibiotics. Patient also had abdominal pelvis CT scan for work-up of intermittent lower abdomen pain.No acute abnormality.   05/14/21 last dose of Velcade maintenance.  establish care with neurology Dr. Gurney Maxin and her neuropathy regimen has been switched from gabapentin to Lyrica. 05/29/2021 patient got influenza   neuropathy medication has been switched from gabapentin/Lyrica to nortriptyline.  08/18/2021 Hospitalized due to pneumonia from metapnuemovirus, hospitalization was complicated with NSTEMI. She received heparin gtt.  Treated with antibiotics for coverage of pneumonia,  diuretics for  pulmonary edema. She received IVIG 423m/kg x 1 for hypoglobulinemia. Discharged on 08/25/21 with Augmentin and Zithromax.   May 2023, Daratumumab being held for [facility] infectious disease physician Dr. GFatima Sangerdue to frequent infection.  INTERVAL HISTORY CDandrea Meddersis a 66y.o. female who has above history reviewed by me today presents for follow up visit for management of multiple myeloma.  Patient has been on weekly magnesium infusion as needed hypomagnesemia Patient gets IVIG if immunoglobulin level < 500 Diagnosed with CHF [HFrEF] -NYHA class II-III, she follows up with cardiology, started on entresto  . Recent hospitalization due to CHF exacerbation, community acquired pneumonia.  Today she feels better, cough has improved. No fever or chills. Occasional diarrhea, she is not take Slow Mag.   Past Medical History:  Diagnosis Date   Anemia    Anxiety    Aortic stenosis    a. 05/2020 Echo: EF 45-50%, sev AS - [facility] CTA sugg of tricuspid valve w/ fusing of 2 leaflets-TAVR deferred in setting of acute infxn; b. 07/2020 Echo: EF 50-55%, mod-sev AS; c. 12/2020 Echo: EF>55%. Mod-sev paradoxical low-flow low-gradient AS; d. 08/2021 Echo: EF 35-40%, severe AS, triv AI, mild MR.   Arthritis    Bicuspid aortic valve    Bisphosphonate-associated osteonecrosis of the jaw ([place] 02/25/2017   Due  to Zometa   Cardiomyopathy, idiopathic ([place])    a. Variable EF over time; b. 08/2017 Echo: EF 40%; b. 03/2020 Echo: EF 55-60%; c. 05/2020 Echo: EF 45-50%; d. 07/2020 Echo: EF 50-55%; e. 12/2020 Echo: EF>55%; f. 08/2021 Echo: EF 35-40%.   Chronic heart failure with preserved ejection fraction (HFpEF) ([place])    a. Variable EF over time; b. 08/2017 Echo: EF 40%; b. 03/2020 Echo: EF 55-60%; c. 05/2020 Echo: EF 45-50%; d. 07/2020 Echo: EF 50-55%; e. 12/2020 Echo: EF>55%; f. 08/2021 Echo: EF 35-40%, no RV, mildly dil LA, mild MR, triv AI, severe AS.   CKD (chronic kidney disease) stage 3, GFR 30-59 ml/min  (HCC)    Depression    Diabetes mellitus (HCC)    Dizziness    Fatty liver    Frequent falls    GERD (gastroesophageal reflux disease)    Gout    Heart murmur    History of blood transfusion    History of bone marrow transplant ([place])    History of uterine fibroid    Hx of cardiac catheterization    a. 01/2016 Cath [facility] - after abnl nuc): Nl cors.   Hypertension    Hypomagnesemia    IDA (iron deficiency anemia)    Multiple myeloma (HCC)    Personal history of chemotherapy    PSVT (paroxysmal supraventricular tachycardia)    a. S/p [place] [id number] [facility]).   PVC's (premature ventricular contractions)    a. Well-managed w/ bisoprolol in outpt setting.   Renal cyst     Past Surgical History:  Procedure Laterality Date   ABDOMINAL HYSTERECTOMY     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   CARDIAC ELECTROPHYSIOLOGY MAPPING AND ABLATION     CARPAL TUNNEL RELEASE Bilateral    CHOLECYSTECTOMY  2008   COLONOSCOPY WITH PROPOFOL N/A 05/07/2017   Procedure: COLONOSCOPY WITH PROPOFOL;  Surgeon: [name], MD;  Location: [facility] ENDOSCOPY;  Service: Gastroenterology;  Laterality: N/A;   COLONOSCOPY WITH PROPOFOL N/A 12/19/2020   Procedure: COLONOSCOPY WITH PROPOFOL;  Surgeon: [name], MD;  Location: [facility] ENDOSCOPY;  Service: Gastroenterology;  Laterality: N/A;   ESOPHAGOGASTRODUODENOSCOPY (EGD) WITH PROPOFOL N/A 05/07/2017   Procedure: ESOPHAGOGASTRODUODENOSCOPY (EGD) WITH PROPOFOL;  Surgeon: [name], MD;  Location: [facility] ENDOSCOPY;  Service: Gastroenterology;  Laterality: N/A;   FOOT SURGERY Bilateral    INCONTINENCE SURGERY  2009   INTERSTIM IMPLANT PLACEMENT     other     over active bladder   OTHER SURGICAL HISTORY     bladder stimulator    PARTIAL HYSTERECTOMY  03/1996   fibroids   PORTA CATH INSERTION N/A 03/10/2019   Procedure: PORTA CATH INSERTION;  Surgeon: Algernon Huxley, MD;  Location: [place] CV LAB;  Service: Cardiovascular;  Laterality: N/A;   TONSILLECTOMY  2007     Family History  Problem Relation Age of Onset   Colon cancer Father    Renal Disease Father    Diabetes Mellitus II Father    Melanoma Paternal Grandmother    Breast cancer Maternal Aunt 55   Anemia Mother    Heart disease Mother    Heart failure Mother    Renal Disease Mother    Congestive Heart Failure Mother    Heart disease Maternal Uncle    Throat cancer Maternal Uncle    Lung cancer Maternal Uncle    Liver disease Maternal Uncle    Heart failure Maternal Uncle    Hearing loss Son 24  Suicide     Social History:  reports that she quit smoking about 30 years ago. Her smoking use included cigarettes. She has a 20.00 pack-year smoking history. She has never used smokeless tobacco. She reports current alcohol use of about 2.0 standard drinks of alcohol per week. She reports that she does not use drugs.  She is on disability. She notes exposure to perchloroethylene [facility]).   Allergies:  Allergies  Allergen Reactions   Oxycodone-Acetaminophen Anaphylaxis    Swelling and rash   Celebrex [Celecoxib] Diarrhea   Codeine    Plerixafor     In 2016 during ASCT collection patient developed fever to 103.31F and required hospitalization   Benadryl [Diphenhydramine] Palpitations   Morphine Itching and Rash   Ondansetron Diarrhea   Tylenol [Acetaminophen] Itching and Rash    Current Medications: Current Outpatient Medications  Medication Sig Dispense Refill   acyclovir (ZOVIRAX) 400 MG tablet Take 1 tablet (400 mg total) by mouth 2 (two) times daily. 180 tablet 1   atorvastatin (LIPITOR) 10 MG tablet Take 0.5 tablets (5 mg total) by mouth daily. 15 tablet 11   calcium carbonate (OS-CAL) 1250 (500 Ca) MG chewable tablet Chew 1 tablet by mouth daily.     diclofenac sodium (VOLTAREN) 1 % GEL Apply 2 g topically 4 (four) times daily. 100 g 1   DULoxetine (CYMBALTA) 60 MG capsule TAKE 1 CAPSULE(60 MG) BY MOUTH DAILY 90 capsule 0   ENTRESTO 24-26 MG Take 1 tablet by mouth 2 (two)  times daily.     FLUoxetine (PROZAC) 40 MG capsule TAKE 1 CAPSULE EVERY DAY 90 capsule 0   furosemide (LASIX) 40 MG tablet Take 40 mg by mouth daily.     glipiZIDE (GLUCOTROL XL) 2.5 MG 24 hr tablet Take 1 tablet (2.5 mg total) by mouth daily with breakfast. 90 tablet 1   glucose blood test strip      montelukast (SINGULAIR) 10 MG tablet Take 1 tablet (10 mg total) by mouth See admin instructions. Take 1 tab daily for 2 days after daratumumab treatments 30 tablet 1   Multiple Vitamin (MULTIVITAMIN) tablet Take 1 tablet by mouth daily.     omeprazole (PRILOSEC) 40 MG capsule TAKE 1 CAPSULE (40 MG TOTAL) BY MOUTH IN THE MORNING AND AT BEDTIME. 180 capsule 0   promethazine (PHENERGAN) 25 MG tablet Take 1 tablet (25 mg total) by mouth every 6 (six) hours as needed for nausea or vomiting. 90 tablet 1   tiZANidine (ZANAFLEX) 4 MG tablet Take 1 tablet (4 mg total) by mouth every 6 (six) hours as needed for muscle spasms. 90 tablet 0   traMADol (ULTRAM) 50 MG tablet Take 50 mg by mouth 2 (two) times daily.     traZODone (DESYREL) 100 MG tablet TAKE 1 TABLET AT BEDTIME 90 tablet 0   vitamin E 45 MG (100 UNITS) capsule Take by mouth daily.     allopurinol (ZYLOPRIM) 100 MG tablet TAKE 1 TABLET(100 MG) BY MOUTH DAILY as needed (Patient not taking: Reported on 04/22/2022) 90 tablet 1   bisoprolol (ZEBETA) 10 MG tablet Take 10 mg by mouth daily.     diphenoxylate-atropine (LOMOTIL) 2.5-0.025 MG tablet Take 2 tablets by mouth 4 (four) times daily as needed for diarrhea or loose stools. (Patient not taking: Reported on 04/22/2022) 64 tablet 3   guaiFENesin-dextromethorphan (ROBITUSSIN DM) 100-10 MG/5ML syrup Take 5 mLs by mouth every 4 (four) hours as needed for cough. (Patient not taking: Reported on 04/22/2022) 118 mL 0  nitroGLYCERIN (NITROSTAT) 0.4 MG SL tablet Place 1 tablet (0.4 mg total) under the tongue every 5 (five) minutes as needed for chest pain. (Patient not taking: Reported on 03/25/2022) 20 tablet  12   No current facility-administered medications for this visit.   Facility-Administered Medications Ordered in Other Visits  Medication Dose Route Frequency Provider Last Rate Last Admin   heparin lock flush 100 unit/mL  500 Units Intracatheter Once PRN Sarah Server, MD       sodium chloride flush (NS) 0.9 % injection 10 mL  10 mL Intravenous PRN Sarah Server, MD   10 mL at 04/02/21 3893    Review of Systems  Constitutional:  Negative for chills, fever, malaise/fatigue and weight loss.  HENT:  Negative for sore throat.   Eyes:  Negative for redness.  Respiratory:  Negative for cough, shortness of breath and wheezing.   Cardiovascular:  Negative for chest pain, palpitations and leg swelling.  Gastrointestinal:  Positive for diarrhea and nausea. Negative for abdominal pain, blood in stool and vomiting.  Genitourinary:  Negative for dysuria.  Musculoskeletal:  Positive for back pain and joint pain. Negative for myalgias.  Skin:  Negative for rash.  Neurological:  Positive for tingling and sensory change. Negative for dizziness and tremors.  Endo/Heme/Allergies:  Does not bruise/bleed easily.  Psychiatric/Behavioral:  Negative for hallucinations.     Performance status (ECOG): 1  Vitals Blood pressure 120/78, pulse 68, temperature (!) 97.3 F (36.3 C), resp. rate 16, weight 154 lb 6.4 oz (70 kg), SpO2 99 %.  Physical Exam Vitals and nursing note reviewed.  Constitutional:      General: She is not in acute distress.    Appearance: She is well-developed. She is not ill-appearing, toxic-appearing or diaphoretic.     Interventions: Face mask in place.  HENT:     Head: Normocephalic and atraumatic.     Mouth/Throat:     Mouth: Mucous membranes are moist.     Pharynx: Oropharynx is clear.  Eyes:     General: No scleral icterus.    Pupils: Pupils are equal, round, and reactive to light.  Cardiovascular:     Rate and Rhythm: Normal rate and regular rhythm.     Heart sounds: Murmur heard.   Pulmonary:     Effort: Pulmonary effort is normal. No respiratory distress.     Breath sounds: Normal breath sounds. No wheezing or rales.  Chest:     Chest wall: No tenderness.  Abdominal:     General: There is no distension.     Palpations: Abdomen is soft. There is no hepatomegaly, splenomegaly or mass.  Musculoskeletal:        General: No tenderness. Normal range of motion.     Cervical back: Normal range of motion and neck supple.     Right lower leg: No edema.     Left lower leg: No edema.  Lymphadenopathy:     Head:     Right side of head: No preauricular, posterior auricular or occipital adenopathy.     Left side of head: No preauricular, posterior auricular or occipital adenopathy.     Cervical: No cervical adenopathy.     Upper Body:     Right upper body: No supraclavicular adenopathy.     Left upper body: No supraclavicular adenopathy.  Skin:    General: Skin is warm and dry.  Neurological:     Mental Status: She is alert and oriented to person, place, and time. Mental status is at  baseline.  Psychiatric:        Mood and Affect: Mood normal.    Laboratory data    Latest Ref Rng & Units 04/22/2022    9:48 AM 04/15/2022    8:13 AM 04/09/2022    4:27 AM  CBC  WBC 4.0 - 10.5 K/uL 6.2  7.6  8.5   Hemoglobin 12.0 - 15.0 g/dL 10.6  11.0  9.7   Hematocrit 36.0 - 46.0 % 33.1  33.8  31.7   Platelets 150 - 400 K/uL 127  126  121       Latest Ref Rng & Units 04/22/2022    9:48 AM 04/15/2022    8:08 AM 04/10/2022    5:50 AM  CMP  Glucose 70 - 99 mg/dL 137  145  121   BUN 8 - 23 mg/dL 22  38  29   Creatinine 0.44 - 1.00 mg/dL 1.11  1.46  1.29   Sodium 135 - 145 mmol/L 135  134  136   Potassium 3.5 - 5.1 mmol/L 3.8  3.6  3.6   Chloride 98 - 111 mmol/L 102  102  105   CO2 22 - 32 mmol/L 23  23  23    Calcium 8.9 - 10.3 mg/dL 8.4  8.3  8.4   Total Protein 6.5 - 8.1 g/dL 6.9     Total Bilirubin 0.3 - 1.2 mg/dL 0.3     Alkaline Phos 38 - 126 U/L 80     AST 15 - 41  U/L 35     ALT 0 - 44 U/L 31

## 2022-04-22 NOTE — Assessment & Plan Note (Signed)
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   Previously on Lyrica and nortriptyline, she declined due to didn't notice much improvement   Neurology recommend Tramadol and may consider higher dose of  Nortriptyline in the future. Follows up with neurology. 

## 2022-04-23 LAB — KAPPA/LAMBDA LIGHT CHAINS
Kappa free light chain: 11 mg/L (ref 3.3–19.4)
Kappa, lambda light chain ratio: 0.81 (ref 0.26–1.65)
Lambda free light chains: 13.5 mg/L (ref 5.7–26.3)

## 2022-04-24 DIAGNOSIS — Z9484 Stem cells transplant status: Secondary | ICD-10-CM | POA: Diagnosis not present

## 2022-04-24 DIAGNOSIS — C9000 Multiple myeloma not having achieved remission: Secondary | ICD-10-CM | POA: Diagnosis not present

## 2022-04-24 DIAGNOSIS — N182 Chronic kidney disease, stage 2 (mild): Secondary | ICD-10-CM | POA: Diagnosis not present

## 2022-04-24 DIAGNOSIS — I5022 Chronic systolic (congestive) heart failure: Secondary | ICD-10-CM | POA: Diagnosis not present

## 2022-04-24 DIAGNOSIS — M87180 Osteonecrosis due to drugs, jaw: Secondary | ICD-10-CM | POA: Diagnosis not present

## 2022-04-24 DIAGNOSIS — N281 Cyst of kidney, acquired: Secondary | ICD-10-CM | POA: Diagnosis not present

## 2022-04-24 DIAGNOSIS — N289 Disorder of kidney and ureter, unspecified: Secondary | ICD-10-CM | POA: Diagnosis not present

## 2022-04-24 DIAGNOSIS — G620 Drug-induced polyneuropathy: Secondary | ICD-10-CM | POA: Diagnosis not present

## 2022-04-24 DIAGNOSIS — R93422 Abnormal radiologic findings on diagnostic imaging of left kidney: Secondary | ICD-10-CM | POA: Diagnosis not present

## 2022-04-24 DIAGNOSIS — D801 Nonfamilial hypogammaglobulinemia: Secondary | ICD-10-CM | POA: Diagnosis not present

## 2022-04-24 DIAGNOSIS — I350 Nonrheumatic aortic (valve) stenosis: Secondary | ICD-10-CM | POA: Diagnosis not present

## 2022-04-24 DIAGNOSIS — C9002 Multiple myeloma in relapse: Secondary | ICD-10-CM | POA: Diagnosis not present

## 2022-04-24 DIAGNOSIS — I352 Nonrheumatic aortic (valve) stenosis with insufficiency: Secondary | ICD-10-CM | POA: Diagnosis not present

## 2022-04-24 DIAGNOSIS — T458X5A Adverse effect of other primarily systemic and hematological agents, initial encounter: Secondary | ICD-10-CM | POA: Diagnosis not present

## 2022-04-24 DIAGNOSIS — T451X5D Adverse effect of antineoplastic and immunosuppressive drugs, subsequent encounter: Secondary | ICD-10-CM | POA: Diagnosis not present

## 2022-04-25 LAB — MULTIPLE MYELOMA PANEL, SERUM
Albumin SerPl Elph-Mcnc: 3.7 g/dL (ref 2.9–4.4)
Albumin/Glob SerPl: 1.5 (ref 0.7–1.7)
Alpha 1: 0.3 g/dL (ref 0.0–0.4)
Alpha2 Glob SerPl Elph-Mcnc: 0.9 g/dL (ref 0.4–1.0)
B-Globulin SerPl Elph-Mcnc: 0.8 g/dL (ref 0.7–1.3)
Gamma Glob SerPl Elph-Mcnc: 0.6 g/dL (ref 0.4–1.8)
Globulin, Total: 2.5 g/dL (ref 2.2–3.9)
IgA: 55 mg/dL — ABNORMAL LOW (ref 87–352)
IgG (Immunoglobin G), Serum: 634 mg/dL (ref 586–1602)
IgM (Immunoglobulin M), Srm: 45 mg/dL (ref 26–217)
Total Protein ELP: 6.2 g/dL (ref 6.0–8.5)

## 2022-04-26 DIAGNOSIS — R93421 Abnormal radiologic findings on diagnostic imaging of right kidney: Secondary | ICD-10-CM | POA: Diagnosis not present

## 2022-04-26 DIAGNOSIS — K760 Fatty (change of) liver, not elsewhere classified: Secondary | ICD-10-CM | POA: Diagnosis not present

## 2022-04-26 DIAGNOSIS — N289 Disorder of kidney and ureter, unspecified: Secondary | ICD-10-CM | POA: Diagnosis not present

## 2022-04-26 DIAGNOSIS — Q6101 Congenital single renal cyst: Secondary | ICD-10-CM | POA: Diagnosis not present

## 2022-04-26 MED FILL — Iron Sucrose Inj 20 MG/ML (Fe Equiv): INTRAVENOUS | Qty: 10 | Status: AC

## 2022-04-29 ENCOUNTER — Inpatient Hospital Stay: Payer: Medicare HMO

## 2022-04-29 DIAGNOSIS — Z515 Encounter for palliative care: Secondary | ICD-10-CM | POA: Diagnosis not present

## 2022-04-29 DIAGNOSIS — R071 Chest pain on breathing: Secondary | ICD-10-CM | POA: Diagnosis not present

## 2022-04-29 DIAGNOSIS — E538 Deficiency of other specified B group vitamins: Secondary | ICD-10-CM | POA: Diagnosis not present

## 2022-04-29 DIAGNOSIS — R058 Other specified cough: Secondary | ICD-10-CM | POA: Diagnosis not present

## 2022-04-29 DIAGNOSIS — D509 Iron deficiency anemia, unspecified: Secondary | ICD-10-CM | POA: Diagnosis not present

## 2022-04-29 DIAGNOSIS — I252 Old myocardial infarction: Secondary | ICD-10-CM | POA: Diagnosis not present

## 2022-04-29 DIAGNOSIS — C9000 Multiple myeloma not having achieved remission: Secondary | ICD-10-CM

## 2022-04-29 DIAGNOSIS — K219 Gastro-esophageal reflux disease without esophagitis: Secondary | ICD-10-CM | POA: Diagnosis not present

## 2022-04-29 DIAGNOSIS — M879 Osteonecrosis, unspecified: Secondary | ICD-10-CM | POA: Diagnosis not present

## 2022-04-29 DIAGNOSIS — C9001 Multiple myeloma in remission: Secondary | ICD-10-CM | POA: Diagnosis not present

## 2022-04-29 LAB — BASIC METABOLIC PANEL
Anion gap: 6 (ref 5–15)
BUN: 26 mg/dL — ABNORMAL HIGH (ref 8–23)
CO2: 23 mmol/L (ref 22–32)
Calcium: 8.6 mg/dL — ABNORMAL LOW (ref 8.9–10.3)
Chloride: 104 mmol/L (ref 98–111)
Creatinine, Ser: 1.18 mg/dL — ABNORMAL HIGH (ref 0.44–1.00)
GFR, Estimated: 51 mL/min — ABNORMAL LOW (ref >60)
Glucose, Bld: 143 mg/dL — ABNORMAL HIGH (ref 70–99)
Potassium: 4 mmol/L (ref 3.5–5.1)
Sodium: 133 mmol/L — ABNORMAL LOW (ref 135–145)

## 2022-04-29 LAB — MAGNESIUM: Magnesium: 1 mg/dL — ABNORMAL LOW (ref 1.7–2.4)

## 2022-04-29 MED ORDER — HEPARIN SOD (PORK) LOCK FLUSH 100 UNIT/ML IV SOLN
500.0000 [IU] | Freq: Once | INTRAVENOUS | Status: AC | PRN
  Administered 2022-04-29: 500 [IU]
  Filled 2022-04-29: qty 5

## 2022-04-29 MED ORDER — SODIUM CHLORIDE 0.9 % IV SOLN
25.0000 mg | Freq: Once | INTRAVENOUS | Status: AC
  Administered 2022-04-29: 25 mg via INTRAVENOUS
  Filled 2022-04-29: qty 1

## 2022-04-29 MED ORDER — SODIUM CHLORIDE 0.9 % IV SOLN: 6.0000 g | Freq: Once | INTRAVENOUS | Status: DC

## 2022-04-29 MED ORDER — SODIUM CHLORIDE 0.9% FLUSH
10.0000 mL | Freq: Once | INTRAVENOUS | Status: AC | PRN
  Administered 2022-04-29: 10 mL
  Filled 2022-04-29: qty 10

## 2022-04-29 MED ORDER — MAGNESIUM SULFATE 4 GM/100ML IV SOLN
4.0000 g | Freq: Once | INTRAVENOUS | Status: AC
  Administered 2022-04-29: 4 g via INTRAVENOUS
  Filled 2022-04-29: qty 100

## 2022-04-29 MED ORDER — SODIUM CHLORIDE 0.9 % IV SOLN
Freq: Once | INTRAVENOUS | Status: AC
  Filled 2022-04-29: qty 250

## 2022-04-29 MED ORDER — MAGNESIUM SULFATE 2 GM/50ML IV SOLN
2.0000 g | Freq: Once | INTRAVENOUS | Status: AC
  Administered 2022-04-29: 2 g via INTRAVENOUS
  Filled 2022-04-29: qty 50

## 2022-04-29 NOTE — Progress Notes (Signed)
Pt found out she is in severe heart failure. EF is 15 %. Pt receiving 6 g magnesium today. Pre medicated with phenergan for nausea. No extra fluids administered.

## 2022-04-30 ENCOUNTER — Ambulatory Visit: Payer: Medicare HMO

## 2022-05-01 DIAGNOSIS — B999 Unspecified infectious disease: Secondary | ICD-10-CM | POA: Diagnosis not present

## 2022-05-01 DIAGNOSIS — C9000 Multiple myeloma not having achieved remission: Secondary | ICD-10-CM | POA: Diagnosis not present

## 2022-05-01 DIAGNOSIS — A689 Relapsing fever, unspecified: Secondary | ICD-10-CM | POA: Diagnosis not present

## 2022-05-06 ENCOUNTER — Inpatient Hospital Stay: Payer: Medicare HMO

## 2022-05-06 DIAGNOSIS — Z888 Allergy status to other drugs, medicaments and biological substances status: Secondary | ICD-10-CM | POA: Diagnosis not present

## 2022-05-06 DIAGNOSIS — E1122 Type 2 diabetes mellitus with diabetic chronic kidney disease: Secondary | ICD-10-CM | POA: Diagnosis not present

## 2022-05-06 DIAGNOSIS — Z87891 Personal history of nicotine dependence: Secondary | ICD-10-CM | POA: Diagnosis not present

## 2022-05-06 DIAGNOSIS — I447 Left bundle-branch block, unspecified: Secondary | ICD-10-CM | POA: Diagnosis not present

## 2022-05-06 DIAGNOSIS — I2510 Atherosclerotic heart disease of native coronary artery without angina pectoris: Secondary | ICD-10-CM | POA: Diagnosis not present

## 2022-05-06 DIAGNOSIS — I5021 Acute systolic (congestive) heart failure: Secondary | ICD-10-CM | POA: Diagnosis not present

## 2022-05-06 DIAGNOSIS — I5022 Chronic systolic (congestive) heart failure: Secondary | ICD-10-CM | POA: Diagnosis not present

## 2022-05-06 DIAGNOSIS — I493 Ventricular premature depolarization: Secondary | ICD-10-CM | POA: Diagnosis not present

## 2022-05-06 DIAGNOSIS — Z886 Allergy status to analgesic agent status: Secondary | ICD-10-CM | POA: Diagnosis not present

## 2022-05-06 DIAGNOSIS — Z885 Allergy status to narcotic agent status: Secondary | ICD-10-CM | POA: Diagnosis not present

## 2022-05-06 DIAGNOSIS — I428 Other cardiomyopathies: Secondary | ICD-10-CM | POA: Diagnosis not present

## 2022-05-06 DIAGNOSIS — N189 Chronic kidney disease, unspecified: Secondary | ICD-10-CM | POA: Diagnosis not present

## 2022-05-07 ENCOUNTER — Inpatient Hospital Stay: Payer: Medicare HMO | Attending: Oncology

## 2022-05-07 ENCOUNTER — Inpatient Hospital Stay: Payer: Medicare HMO

## 2022-05-07 DIAGNOSIS — Z818 Family history of other mental and behavioral disorders: Secondary | ICD-10-CM | POA: Insufficient documentation

## 2022-05-07 DIAGNOSIS — Z832 Family history of diseases of the blood and blood-forming organs and certain disorders involving the immune mechanism: Secondary | ICD-10-CM | POA: Insufficient documentation

## 2022-05-07 DIAGNOSIS — Z9049 Acquired absence of other specified parts of digestive tract: Secondary | ICD-10-CM | POA: Diagnosis not present

## 2022-05-07 DIAGNOSIS — Z885 Allergy status to narcotic agent status: Secondary | ICD-10-CM | POA: Insufficient documentation

## 2022-05-07 DIAGNOSIS — I130 Hypertensive heart and chronic kidney disease with heart failure and stage 1 through stage 4 chronic kidney disease, or unspecified chronic kidney disease: Secondary | ICD-10-CM | POA: Diagnosis not present

## 2022-05-07 DIAGNOSIS — Z8249 Family history of ischemic heart disease and other diseases of the circulatory system: Secondary | ICD-10-CM | POA: Insufficient documentation

## 2022-05-07 DIAGNOSIS — Z87891 Personal history of nicotine dependence: Secondary | ICD-10-CM | POA: Diagnosis not present

## 2022-05-07 DIAGNOSIS — Z841 Family history of disorders of kidney and ureter: Secondary | ICD-10-CM | POA: Insufficient documentation

## 2022-05-07 DIAGNOSIS — I350 Nonrheumatic aortic (valve) stenosis: Secondary | ICD-10-CM | POA: Insufficient documentation

## 2022-05-07 DIAGNOSIS — M2550 Pain in unspecified joint: Secondary | ICD-10-CM | POA: Diagnosis not present

## 2022-05-07 DIAGNOSIS — E1122 Type 2 diabetes mellitus with diabetic chronic kidney disease: Secondary | ICD-10-CM | POA: Diagnosis not present

## 2022-05-07 DIAGNOSIS — Z9481 Bone marrow transplant status: Secondary | ICD-10-CM | POA: Diagnosis not present

## 2022-05-07 DIAGNOSIS — T451X5A Adverse effect of antineoplastic and immunosuppressive drugs, initial encounter: Secondary | ICD-10-CM | POA: Diagnosis not present

## 2022-05-07 DIAGNOSIS — C9001 Multiple myeloma in remission: Secondary | ICD-10-CM | POA: Insufficient documentation

## 2022-05-07 DIAGNOSIS — Z95828 Presence of other vascular implants and grafts: Secondary | ICD-10-CM

## 2022-05-07 DIAGNOSIS — Z803 Family history of malignant neoplasm of breast: Secondary | ICD-10-CM | POA: Insufficient documentation

## 2022-05-07 DIAGNOSIS — N1832 Chronic kidney disease, stage 3b: Secondary | ICD-10-CM | POA: Diagnosis not present

## 2022-05-07 DIAGNOSIS — Z800 Family history of malignant neoplasm of digestive organs: Secondary | ICD-10-CM | POA: Insufficient documentation

## 2022-05-07 DIAGNOSIS — I5040 Unspecified combined systolic (congestive) and diastolic (congestive) heart failure: Secondary | ICD-10-CM | POA: Insufficient documentation

## 2022-05-07 DIAGNOSIS — Z822 Family history of deafness and hearing loss: Secondary | ICD-10-CM | POA: Insufficient documentation

## 2022-05-07 DIAGNOSIS — Z886 Allergy status to analgesic agent status: Secondary | ICD-10-CM | POA: Insufficient documentation

## 2022-05-07 DIAGNOSIS — K219 Gastro-esophageal reflux disease without esophagitis: Secondary | ICD-10-CM | POA: Diagnosis not present

## 2022-05-07 DIAGNOSIS — Z79899 Other long term (current) drug therapy: Secondary | ICD-10-CM | POA: Insufficient documentation

## 2022-05-07 DIAGNOSIS — D500 Iron deficiency anemia secondary to blood loss (chronic): Secondary | ICD-10-CM | POA: Diagnosis not present

## 2022-05-07 DIAGNOSIS — Z833 Family history of diabetes mellitus: Secondary | ICD-10-CM | POA: Insufficient documentation

## 2022-05-07 DIAGNOSIS — R110 Nausea: Secondary | ICD-10-CM | POA: Diagnosis not present

## 2022-05-07 DIAGNOSIS — M549 Dorsalgia, unspecified: Secondary | ICD-10-CM | POA: Insufficient documentation

## 2022-05-07 DIAGNOSIS — R197 Diarrhea, unspecified: Secondary | ICD-10-CM | POA: Diagnosis not present

## 2022-05-07 DIAGNOSIS — Z801 Family history of malignant neoplasm of trachea, bronchus and lung: Secondary | ICD-10-CM | POA: Insufficient documentation

## 2022-05-07 DIAGNOSIS — G620 Drug-induced polyneuropathy: Secondary | ICD-10-CM | POA: Insufficient documentation

## 2022-05-07 DIAGNOSIS — Z888 Allergy status to other drugs, medicaments and biological substances status: Secondary | ICD-10-CM | POA: Diagnosis not present

## 2022-05-07 DIAGNOSIS — C9000 Multiple myeloma not having achieved remission: Secondary | ICD-10-CM

## 2022-05-07 DIAGNOSIS — E538 Deficiency of other specified B group vitamins: Secondary | ICD-10-CM | POA: Diagnosis not present

## 2022-05-07 DIAGNOSIS — Z808 Family history of malignant neoplasm of other organs or systems: Secondary | ICD-10-CM | POA: Insufficient documentation

## 2022-05-07 DIAGNOSIS — Z9484 Stem cells transplant status: Secondary | ICD-10-CM | POA: Diagnosis not present

## 2022-05-07 LAB — MAGNESIUM: Magnesium: 1.1 mg/dL — ABNORMAL LOW (ref 1.7–2.4)

## 2022-05-07 LAB — BASIC METABOLIC PANEL
Anion gap: 9 (ref 5–15)
BUN: 31 mg/dL — ABNORMAL HIGH (ref 8–23)
CO2: 24 mmol/L (ref 22–32)
Calcium: 9.2 mg/dL (ref 8.9–10.3)
Chloride: 104 mmol/L (ref 98–111)
Creatinine, Ser: 1.18 mg/dL — ABNORMAL HIGH (ref 0.44–1.00)
GFR, Estimated: 51 mL/min — ABNORMAL LOW (ref >60)
Glucose, Bld: 157 mg/dL — ABNORMAL HIGH (ref 70–99)
Potassium: 4.4 mmol/L (ref 3.5–5.1)
Sodium: 137 mmol/L (ref 135–145)

## 2022-05-07 MED ORDER — SODIUM CHLORIDE 0.9% FLUSH
10.0000 mL | Freq: Once | INTRAVENOUS | Status: AC
  Administered 2022-05-07: 10 mL via INTRAVENOUS
  Filled 2022-05-07: qty 10

## 2022-05-07 MED ORDER — HEPARIN SOD (PORK) LOCK FLUSH 100 UNIT/ML IV SOLN
500.0000 [IU] | Freq: Once | INTRAVENOUS | Status: AC
  Administered 2022-05-07: 500 [IU]
  Filled 2022-05-07: qty 5

## 2022-05-07 MED ORDER — SODIUM CHLORIDE 0.9 % IV SOLN
INTRAVENOUS | Status: DC
  Filled 2022-05-07: qty 250

## 2022-05-07 MED ORDER — MAGNESIUM SULFATE 4 GM/100ML IV SOLN
4.0000 g | Freq: Once | INTRAVENOUS | Status: AC
  Administered 2022-05-07: 4 g via INTRAVENOUS
  Filled 2022-05-07: qty 100

## 2022-05-07 MED ORDER — MAGNESIUM SULFATE 2 GM/50ML IV SOLN
2.0000 g | Freq: Once | INTRAVENOUS | Status: AC
  Administered 2022-05-07: 2 g via INTRAVENOUS
  Filled 2022-05-07: qty 50

## 2022-05-07 MED ORDER — SODIUM CHLORIDE 0.9 % IV SOLN
25.0000 mg | Freq: Once | INTRAVENOUS | Status: AC
  Administered 2022-05-07: 25 mg via INTRAVENOUS
  Filled 2022-05-07: qty 1

## 2022-05-07 MED ORDER — SODIUM CHLORIDE 0.9 % IV SOLN: 6.0000 g | Freq: Once | INTRAVENOUS | Status: DC

## 2022-05-09 DIAGNOSIS — I350 Nonrheumatic aortic (valve) stenosis: Secondary | ICD-10-CM | POA: Diagnosis not present

## 2022-05-09 DIAGNOSIS — R9431 Abnormal electrocardiogram [ECG] [EKG]: Secondary | ICD-10-CM | POA: Diagnosis not present

## 2022-05-09 DIAGNOSIS — I493 Ventricular premature depolarization: Secondary | ICD-10-CM | POA: Diagnosis not present

## 2022-05-09 DIAGNOSIS — I447 Left bundle-branch block, unspecified: Secondary | ICD-10-CM | POA: Diagnosis not present

## 2022-05-09 DIAGNOSIS — I5022 Chronic systolic (congestive) heart failure: Secondary | ICD-10-CM | POA: Diagnosis not present

## 2022-05-13 ENCOUNTER — Inpatient Hospital Stay: Payer: Medicare HMO

## 2022-05-13 VITALS — BP 112/68 | HR 73 | Temp 96.8°F | Resp 20

## 2022-05-13 DIAGNOSIS — I350 Nonrheumatic aortic (valve) stenosis: Secondary | ICD-10-CM | POA: Diagnosis not present

## 2022-05-13 DIAGNOSIS — C9001 Multiple myeloma in remission: Secondary | ICD-10-CM | POA: Diagnosis not present

## 2022-05-13 DIAGNOSIS — R110 Nausea: Secondary | ICD-10-CM | POA: Diagnosis not present

## 2022-05-13 DIAGNOSIS — C9000 Multiple myeloma not having achieved remission: Secondary | ICD-10-CM

## 2022-05-13 DIAGNOSIS — G620 Drug-induced polyneuropathy: Secondary | ICD-10-CM | POA: Diagnosis not present

## 2022-05-13 DIAGNOSIS — Z9484 Stem cells transplant status: Secondary | ICD-10-CM | POA: Diagnosis not present

## 2022-05-13 DIAGNOSIS — D500 Iron deficiency anemia secondary to blood loss (chronic): Secondary | ICD-10-CM | POA: Diagnosis not present

## 2022-05-13 DIAGNOSIS — T451X5A Adverse effect of antineoplastic and immunosuppressive drugs, initial encounter: Secondary | ICD-10-CM | POA: Diagnosis not present

## 2022-05-13 DIAGNOSIS — E538 Deficiency of other specified B group vitamins: Secondary | ICD-10-CM | POA: Diagnosis not present

## 2022-05-13 LAB — BASIC METABOLIC PANEL
Anion gap: 10 (ref 5–15)
BUN: 29 mg/dL — ABNORMAL HIGH (ref 8–23)
CO2: 23 mmol/L (ref 22–32)
Calcium: 8.9 mg/dL (ref 8.9–10.3)
Chloride: 103 mmol/L (ref 98–111)
Creatinine, Ser: 1.36 mg/dL — ABNORMAL HIGH (ref 0.44–1.00)
GFR, Estimated: 43 mL/min — ABNORMAL LOW (ref >60)
Glucose, Bld: 129 mg/dL — ABNORMAL HIGH (ref 70–99)
Potassium: 4.1 mmol/L (ref 3.5–5.1)
Sodium: 136 mmol/L (ref 135–145)

## 2022-05-13 LAB — MAGNESIUM: Magnesium: 1.2 mg/dL — ABNORMAL LOW (ref 1.7–2.4)

## 2022-05-13 MED ORDER — SODIUM CHLORIDE 0.9 % IV SOLN
25.0000 mg | Freq: Once | INTRAVENOUS | Status: AC
  Administered 2022-05-13: 25 mg via INTRAVENOUS
  Filled 2022-05-13: qty 1

## 2022-05-13 MED ORDER — SODIUM CHLORIDE 0.9% FLUSH
10.0000 mL | Freq: Once | INTRAVENOUS | Status: AC
  Administered 2022-05-13: 10 mL via INTRAVENOUS
  Filled 2022-05-13: qty 10

## 2022-05-13 MED ORDER — MAGNESIUM SULFATE 2 GM/50ML IV SOLN
2.0000 g | Freq: Once | INTRAVENOUS | Status: AC
  Administered 2022-05-13: 2 g via INTRAVENOUS
  Filled 2022-05-13: qty 50

## 2022-05-13 MED ORDER — SODIUM CHLORIDE 0.9 % IV SOLN: 6.0000 g | Freq: Once | INTRAVENOUS | Status: DC

## 2022-05-13 MED ORDER — SODIUM CHLORIDE 0.9 % IV SOLN
Freq: Once | INTRAVENOUS | Status: AC
  Filled 2022-05-13: qty 250

## 2022-05-13 MED ORDER — HEPARIN SOD (PORK) LOCK FLUSH 100 UNIT/ML IV SOLN
500.0000 [IU] | Freq: Once | INTRAVENOUS | Status: AC
  Administered 2022-05-13: 500 [IU] via INTRAVENOUS
  Filled 2022-05-13: qty 5

## 2022-05-13 MED ORDER — MAGNESIUM SULFATE 4 GM/100ML IV SOLN
4.0000 g | Freq: Once | INTRAVENOUS | Status: AC
  Administered 2022-05-13: 4 g via INTRAVENOUS
  Filled 2022-05-13: qty 100

## 2022-05-20 ENCOUNTER — Encounter: Payer: Self-pay | Admitting: Oncology

## 2022-05-20 ENCOUNTER — Inpatient Hospital Stay: Payer: Medicare HMO

## 2022-05-20 ENCOUNTER — Inpatient Hospital Stay (HOSPITAL_BASED_OUTPATIENT_CLINIC_OR_DEPARTMENT_OTHER): Payer: Medicare HMO | Admitting: Oncology

## 2022-05-20 VITALS — BP 106/65 | HR 69 | Temp 96.1°F | Wt 158.5 lb

## 2022-05-20 DIAGNOSIS — G629 Polyneuropathy, unspecified: Secondary | ICD-10-CM | POA: Diagnosis not present

## 2022-05-20 DIAGNOSIS — R771 Abnormality of globulin: Secondary | ICD-10-CM | POA: Diagnosis not present

## 2022-05-20 DIAGNOSIS — T451X5A Adverse effect of antineoplastic and immunosuppressive drugs, initial encounter: Secondary | ICD-10-CM | POA: Diagnosis not present

## 2022-05-20 DIAGNOSIS — Z9484 Stem cells transplant status: Secondary | ICD-10-CM | POA: Diagnosis not present

## 2022-05-20 DIAGNOSIS — E538 Deficiency of other specified B group vitamins: Secondary | ICD-10-CM | POA: Diagnosis not present

## 2022-05-20 DIAGNOSIS — R197 Diarrhea, unspecified: Secondary | ICD-10-CM | POA: Diagnosis not present

## 2022-05-20 DIAGNOSIS — Z7682 Awaiting organ transplant status: Secondary | ICD-10-CM | POA: Diagnosis not present

## 2022-05-20 DIAGNOSIS — G620 Drug-induced polyneuropathy: Secondary | ICD-10-CM | POA: Diagnosis not present

## 2022-05-20 DIAGNOSIS — C9001 Multiple myeloma in remission: Secondary | ICD-10-CM | POA: Diagnosis not present

## 2022-05-20 DIAGNOSIS — R110 Nausea: Secondary | ICD-10-CM | POA: Diagnosis not present

## 2022-05-20 DIAGNOSIS — D500 Iron deficiency anemia secondary to blood loss (chronic): Secondary | ICD-10-CM | POA: Diagnosis not present

## 2022-05-20 DIAGNOSIS — C9000 Multiple myeloma not having achieved remission: Secondary | ICD-10-CM

## 2022-05-20 DIAGNOSIS — I350 Nonrheumatic aortic (valve) stenosis: Secondary | ICD-10-CM | POA: Diagnosis not present

## 2022-05-20 LAB — CBC WITH DIFFERENTIAL/PLATELET
Abs Immature Granulocytes: 0.02 10*3/uL (ref 0.00–0.07)
Basophils Absolute: 0 10*3/uL (ref 0.0–0.1)
Basophils Relative: 0 %
Eosinophils Absolute: 0.1 10*3/uL (ref 0.0–0.5)
Eosinophils Relative: 2 %
HCT: 36.4 % (ref 36.0–46.0)
Hemoglobin: 11.7 g/dL — ABNORMAL LOW (ref 12.0–15.0)
Immature Granulocytes: 0 %
Lymphocytes Relative: 27 %
Lymphs Abs: 1.5 10*3/uL (ref 0.7–4.0)
MCH: 26.8 pg (ref 26.0–34.0)
MCHC: 32.1 g/dL (ref 30.0–36.0)
MCV: 83.5 fL (ref 80.0–100.0)
Monocytes Absolute: 0.4 10*3/uL (ref 0.1–1.0)
Monocytes Relative: 7 %
Neutro Abs: 3.7 10*3/uL (ref 1.7–7.7)
Neutrophils Relative %: 64 %
Platelets: 119 10*3/uL — ABNORMAL LOW (ref 150–400)
RBC: 4.36 MIL/uL (ref 3.87–5.11)
RDW: 17.9 % — ABNORMAL HIGH (ref 11.5–15.5)
WBC: 5.8 10*3/uL (ref 4.0–10.5)
nRBC: 0 % (ref 0.0–0.2)

## 2022-05-20 LAB — COMPREHENSIVE METABOLIC PANEL
ALT: 30 U/L (ref 0–44)
AST: 31 U/L (ref 15–41)
Albumin: 4.2 g/dL (ref 3.5–5.0)
Alkaline Phosphatase: 95 U/L (ref 38–126)
Anion gap: 6 (ref 5–15)
BUN: 37 mg/dL — ABNORMAL HIGH (ref 8–23)
CO2: 22 mmol/L (ref 22–32)
Calcium: 9.2 mg/dL (ref 8.9–10.3)
Chloride: 107 mmol/L (ref 98–111)
Creatinine, Ser: 1.28 mg/dL — ABNORMAL HIGH (ref 0.44–1.00)
GFR, Estimated: 46 mL/min — ABNORMAL LOW (ref >60)
Glucose, Bld: 142 mg/dL — ABNORMAL HIGH (ref 70–99)
Potassium: 4.5 mmol/L (ref 3.5–5.1)
Sodium: 135 mmol/L (ref 135–145)
Total Bilirubin: 0.3 mg/dL (ref 0.3–1.2)
Total Protein: 7.2 g/dL (ref 6.5–8.1)

## 2022-05-20 LAB — MAGNESIUM: Magnesium: 1.3 mg/dL — ABNORMAL LOW (ref 1.7–2.4)

## 2022-05-20 MED ORDER — HEPARIN SOD (PORK) LOCK FLUSH 100 UNIT/ML IV SOLN
500.0000 [IU] | Freq: Once | INTRAVENOUS | Status: AC
  Administered 2022-05-20: 500 [IU] via INTRAVENOUS
  Filled 2022-05-20: qty 5

## 2022-05-20 MED ORDER — CYANOCOBALAMIN 1000 MCG/ML IJ SOLN
1000.0000 ug | Freq: Once | INTRAMUSCULAR | Status: AC
  Administered 2022-05-20: 1000 ug via INTRAMUSCULAR
  Filled 2022-05-20: qty 1

## 2022-05-20 MED ORDER — SODIUM CHLORIDE 0.9 % IV SOLN: 6.0000 g | Freq: Once | INTRAVENOUS | Status: DC

## 2022-05-20 MED ORDER — SODIUM CHLORIDE 0.9 % IV SOLN
25.0000 mg | Freq: Once | INTRAVENOUS | Status: AC
  Administered 2022-05-20: 25 mg via INTRAVENOUS
  Filled 2022-05-20: qty 1

## 2022-05-20 MED ORDER — SODIUM CHLORIDE 0.9 % IV SOLN
Freq: Once | INTRAVENOUS | Status: AC
  Filled 2022-05-20: qty 250

## 2022-05-20 MED ORDER — MAGNESIUM SULFATE 4 GM/100ML IV SOLN
4.0000 g | Freq: Once | INTRAVENOUS | Status: AC
  Administered 2022-05-20: 4 g via INTRAVENOUS
  Filled 2022-05-20: qty 100

## 2022-05-20 MED ORDER — MAGNESIUM SULFATE 2 GM/50ML IV SOLN
2.0000 g | Freq: Once | INTRAVENOUS | Status: AC
  Administered 2022-05-20: 2 g via INTRAVENOUS
  Filled 2022-05-20: qty 50

## 2022-05-20 NOTE — Assessment & Plan Note (Signed)
Continue B12 monthly injection.

## 2022-05-20 NOTE — Assessment & Plan Note (Signed)
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   Previously on Lyrica and nortriptyline, she declined due to didn't notice much improvement   Neurology recommend Tramadol and may consider higher dose of  Nortriptyline in the future. Follows up with neurology.
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
## 2022-05-20 NOTE — Assessment & Plan Note (Signed)
#  Chronic hypomagnesemia proceed with  IV magnesium today.  Continue weekly magnesium +/- IV magnesium.

## 2022-05-20 NOTE — Assessment & Plan Note (Signed)
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  Light chain ratio is normal. 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 Repeat immunofixation negative. . Labs reviewed and discussed with patient, stable M protein and light chain ratio.  Daratumumab on hold due to frequent infections despite IVIG Continue acyclovir prophylaxis.

## 2022-05-20 NOTE — Assessment & Plan Note (Signed)
S/p Venofer weekly x 4 Hb has improved. Observation. .   

## 2022-05-20 NOTE — Assessment & Plan Note (Signed)
chronic diarrhea,antidiarrhea PRN. 

## 2022-05-20 NOTE — Assessment & Plan Note (Signed)
-  [facility]- Post transplant Associate Professor [phone]).

## 2022-05-20 NOTE — Assessment & Plan Note (Signed)
PRN IVIG infusion if IgG level < 500.
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
## 2022-05-21 DIAGNOSIS — R682 Dry mouth, unspecified: Secondary | ICD-10-CM | POA: Diagnosis not present

## 2022-05-21 DIAGNOSIS — M879 Osteonecrosis, unspecified: Secondary | ICD-10-CM | POA: Diagnosis not present

## 2022-05-21 LAB — KAPPA/LAMBDA LIGHT CHAINS
Kappa free light chain: 25 mg/L — ABNORMAL HIGH (ref 3.3–19.4)
Kappa, lambda light chain ratio: 1.71 — ABNORMAL HIGH (ref 0.26–1.65)
Lambda free light chains: 14.6 mg/L (ref 5.7–26.3)

## 2022-05-27 ENCOUNTER — Inpatient Hospital Stay: Payer: Medicare HMO

## 2022-05-27 VITALS — BP 99/60 | HR 74 | Temp 97.4°F | Resp 20

## 2022-05-27 DIAGNOSIS — Z9484 Stem cells transplant status: Secondary | ICD-10-CM | POA: Diagnosis not present

## 2022-05-27 DIAGNOSIS — I350 Nonrheumatic aortic (valve) stenosis: Secondary | ICD-10-CM | POA: Diagnosis not present

## 2022-05-27 DIAGNOSIS — G620 Drug-induced polyneuropathy: Secondary | ICD-10-CM | POA: Diagnosis not present

## 2022-05-27 DIAGNOSIS — C9001 Multiple myeloma in remission: Secondary | ICD-10-CM | POA: Diagnosis not present

## 2022-05-27 DIAGNOSIS — D500 Iron deficiency anemia secondary to blood loss (chronic): Secondary | ICD-10-CM | POA: Diagnosis not present

## 2022-05-27 DIAGNOSIS — C9000 Multiple myeloma not having achieved remission: Secondary | ICD-10-CM

## 2022-05-27 DIAGNOSIS — R110 Nausea: Secondary | ICD-10-CM | POA: Diagnosis not present

## 2022-05-27 DIAGNOSIS — E538 Deficiency of other specified B group vitamins: Secondary | ICD-10-CM | POA: Diagnosis not present

## 2022-05-27 DIAGNOSIS — T451X5A Adverse effect of antineoplastic and immunosuppressive drugs, initial encounter: Secondary | ICD-10-CM | POA: Diagnosis not present

## 2022-05-27 LAB — MAGNESIUM: Magnesium: 1.2 mg/dL — ABNORMAL LOW (ref 1.7–2.4)

## 2022-05-27 LAB — MULTIPLE MYELOMA PANEL, SERUM
Albumin SerPl Elph-Mcnc: 4 g/dL (ref 2.9–4.4)
Albumin/Glob SerPl: 1.5 (ref 0.7–1.7)
Alpha 1: 0.2 g/dL (ref 0.0–0.4)
Alpha2 Glob SerPl Elph-Mcnc: 0.9 g/dL (ref 0.4–1.0)
B-Globulin SerPl Elph-Mcnc: 0.9 g/dL (ref 0.7–1.3)
Gamma Glob SerPl Elph-Mcnc: 0.7 g/dL (ref 0.4–1.8)
Globulin, Total: 2.7 g/dL (ref 2.2–3.9)
IgA: 110 mg/dL (ref 87–352)
IgG (Immunoglobin G), Serum: 612 mg/dL (ref 586–1602)
IgM (Immunoglobulin M), Srm: 33 mg/dL (ref 26–217)
Total Protein ELP: 6.7 g/dL (ref 6.0–8.5)

## 2022-05-27 MED ORDER — SODIUM CHLORIDE 0.9 % IV SOLN
Freq: Once | INTRAVENOUS | Status: AC
  Filled 2022-05-27: qty 250

## 2022-05-27 MED ORDER — SODIUM CHLORIDE 0.9 % IV SOLN: 6.0000 g | Freq: Once | INTRAVENOUS | Status: DC

## 2022-05-27 MED ORDER — SODIUM CHLORIDE 0.9 % IV SOLN: 4.0000 g | Freq: Once | INTRAVENOUS | Status: DC

## 2022-05-27 MED ORDER — MAGNESIUM SULFATE 2 GM/50ML IV SOLN
2.0000 g | Freq: Once | INTRAVENOUS | Status: AC
  Administered 2022-05-27: 2 g via INTRAVENOUS
  Filled 2022-05-27: qty 50

## 2022-05-27 MED ORDER — MAGNESIUM SULFATE 2 GM/50ML IV SOLN: 2.0000 g | Freq: Once | INTRAVENOUS | Status: DC

## 2022-05-27 MED ORDER — SODIUM CHLORIDE 0.9% FLUSH
10.0000 mL | Freq: Once | INTRAVENOUS | Status: DC
  Filled 2022-05-27: qty 10

## 2022-05-27 MED ORDER — SODIUM CHLORIDE 0.9 % IV SOLN
25.0000 mg | Freq: Once | INTRAVENOUS | Status: AC
  Administered 2022-05-27: 25 mg via INTRAVENOUS
  Filled 2022-05-27: qty 1

## 2022-05-27 MED ORDER — HEPARIN SOD (PORK) LOCK FLUSH 100 UNIT/ML IV SOLN
500.0000 [IU] | Freq: Once | INTRAVENOUS | Status: AC
  Administered 2022-05-27: 500 [IU] via INTRAVENOUS
  Filled 2022-05-27: qty 5

## 2022-05-27 MED ORDER — MAGNESIUM SULFATE 4 GM/100ML IV SOLN
4.0000 g | Freq: Once | INTRAVENOUS | Status: AC
  Administered 2022-05-27: 4 g via INTRAVENOUS
  Filled 2022-05-27: qty 100

## 2022-05-28 ENCOUNTER — Other Ambulatory Visit: Payer: Self-pay | Admitting: Internal Medicine

## 2022-05-28 ENCOUNTER — Encounter: Payer: Self-pay | Admitting: Internal Medicine

## 2022-05-28 DIAGNOSIS — T458X5A Adverse effect of other primarily systemic and hematological agents, initial encounter: Secondary | ICD-10-CM | POA: Diagnosis not present

## 2022-05-28 DIAGNOSIS — I5032 Chronic diastolic (congestive) heart failure: Secondary | ICD-10-CM | POA: Diagnosis not present

## 2022-05-28 DIAGNOSIS — E1122 Type 2 diabetes mellitus with diabetic chronic kidney disease: Secondary | ICD-10-CM | POA: Diagnosis not present

## 2022-05-28 DIAGNOSIS — C9001 Multiple myeloma in remission: Secondary | ICD-10-CM | POA: Diagnosis not present

## 2022-05-28 DIAGNOSIS — N183 Chronic kidney disease, stage 3 unspecified: Secondary | ICD-10-CM | POA: Diagnosis not present

## 2022-05-28 DIAGNOSIS — Z23 Encounter for immunization: Secondary | ICD-10-CM | POA: Diagnosis not present

## 2022-05-28 DIAGNOSIS — E1142 Type 2 diabetes mellitus with diabetic polyneuropathy: Secondary | ICD-10-CM | POA: Diagnosis not present

## 2022-05-28 DIAGNOSIS — Z9481 Bone marrow transplant status: Secondary | ICD-10-CM | POA: Diagnosis not present

## 2022-05-28 DIAGNOSIS — M47816 Spondylosis without myelopathy or radiculopathy, lumbar region: Secondary | ICD-10-CM

## 2022-05-28 DIAGNOSIS — N179 Acute kidney failure, unspecified: Secondary | ICD-10-CM | POA: Diagnosis not present

## 2022-05-28 DIAGNOSIS — M87180 Osteonecrosis due to drugs, jaw: Secondary | ICD-10-CM | POA: Diagnosis not present

## 2022-05-28 DIAGNOSIS — Z9484 Stem cells transplant status: Secondary | ICD-10-CM | POA: Diagnosis not present

## 2022-05-29 ENCOUNTER — Other Ambulatory Visit: Payer: Self-pay | Admitting: Internal Medicine

## 2022-05-29 DIAGNOSIS — E118 Type 2 diabetes mellitus with unspecified complications: Secondary | ICD-10-CM

## 2022-05-29 MED ORDER — BISOPROLOL FUMARATE 10 MG PO TABS: 10.0000 mg | ORAL_TABLET | Freq: Every day | ORAL | 1 refills | Status: DC

## 2022-05-29 MED ORDER — GLIPIZIDE ER 2.5 MG PO TB24: 2.5000 mg | ORAL_TABLET | Freq: Every day | ORAL | 1 refills | Status: DC

## 2022-05-29 MED ORDER — GLIPIZIDE ER 2.5 MG PO TB24: 2.5000 mg | ORAL_TABLET | Freq: Every day | ORAL | 0 refills | Status: DC

## 2022-05-29 MED ORDER — TIZANIDINE HCL 4 MG PO TABS: 4.0000 mg | ORAL_TABLET | Freq: Four times a day (QID) | ORAL | 0 refills | Status: DC | PRN

## 2022-05-29 NOTE — Telephone Encounter (Signed)
Requested Prescriptions  Pending Prescriptions Disp Refills   glipiZIDE (GLUCOTROL XL) 2.5 MG 24 hr tablet [Pharmacy Med Name: GLIPIZIDE ER 2.'5MG'$  TABLETS] 90 tablet     Sig: TAKE 1 TABLET(2.5 MG) BY MOUTH DAILY WITH BREAKFAST     Endocrinology:  Diabetes - Sulfonylureas Failed - 05/29/2022 12:42 PM      Failed - Cr in normal range and within 360 days    Creatinine, Ser  Date Value Ref Range Status  05/20/2022 1.28 (H) 0.44 - 1.00 mg/dL Final         Passed - HBA1C is between 0 and 7.9 and within 180 days    Hgb A1c MFr Bld  Date Value Ref Range Status  12/07/2021 6.9 (H) 4.8 - 5.6 % Final    Comment:             Prediabetes: 5.7 - 6.4          Diabetes: >6.4          Glycemic control for adults with diabetes: <7.0          Passed - Valid encounter within last 6 months    Recent Outpatient Visits           4 months ago Bacterial URI   [facility] [place], Earley Abide, MD   5 months ago Annual physical exam   [facility] at [facility], Jesse Sans, MD   11 months ago Viral URI with cough   [place] [facility] at [facility], Jesse Sans, MD   1 year ago Exposure to influenza   [facility] at [facility], Jesse Sans, MD   1 year ago Pneumonia of right lower lobe due to infectious organism   [facility] at [facility], Jesse Sans, MD       Future Appointments             In 6 months Army Melia, Jesse Sans, MD [place] [facility] at [facility], [facility]

## 2022-05-29 NOTE — Telephone Encounter (Signed)
Pt response.  KP

## 2022-05-30 ENCOUNTER — Other Ambulatory Visit: Payer: Self-pay | Admitting: Internal Medicine

## 2022-05-30 DIAGNOSIS — K219 Gastro-esophageal reflux disease without esophagitis: Secondary | ICD-10-CM

## 2022-05-31 DIAGNOSIS — I493 Ventricular premature depolarization: Secondary | ICD-10-CM | POA: Diagnosis not present

## 2022-05-31 LAB — HISTOPLASMA GAL'MANNAN AG SER: Histoplasma Gal'mannan Ag Ser: <0.5 (ref <0.5)

## 2022-06-03 ENCOUNTER — Inpatient Hospital Stay: Payer: Medicare HMO

## 2022-06-03 ENCOUNTER — Inpatient Hospital Stay: Payer: Medicare HMO | Attending: Oncology

## 2022-06-03 VITALS — BP 102/65 | HR 74 | Temp 97.1°F | Resp 20

## 2022-06-03 DIAGNOSIS — K30 Functional dyspepsia: Secondary | ICD-10-CM | POA: Diagnosis not present

## 2022-06-03 DIAGNOSIS — Z801 Family history of malignant neoplasm of trachea, bronchus and lung: Secondary | ICD-10-CM | POA: Insufficient documentation

## 2022-06-03 DIAGNOSIS — Z5986 Financial insecurity: Secondary | ICD-10-CM | POA: Diagnosis not present

## 2022-06-03 DIAGNOSIS — Z87891 Personal history of nicotine dependence: Secondary | ICD-10-CM | POA: Insufficient documentation

## 2022-06-03 DIAGNOSIS — E1122 Type 2 diabetes mellitus with diabetic chronic kidney disease: Secondary | ICD-10-CM | POA: Insufficient documentation

## 2022-06-03 DIAGNOSIS — Z8249 Family history of ischemic heart disease and other diseases of the circulatory system: Secondary | ICD-10-CM | POA: Insufficient documentation

## 2022-06-03 DIAGNOSIS — Z79899 Other long term (current) drug therapy: Secondary | ICD-10-CM | POA: Insufficient documentation

## 2022-06-03 DIAGNOSIS — I252 Old myocardial infarction: Secondary | ICD-10-CM | POA: Diagnosis not present

## 2022-06-03 DIAGNOSIS — Z822 Family history of deafness and hearing loss: Secondary | ICD-10-CM | POA: Insufficient documentation

## 2022-06-03 DIAGNOSIS — Z885 Allergy status to narcotic agent status: Secondary | ICD-10-CM | POA: Diagnosis not present

## 2022-06-03 DIAGNOSIS — R197 Diarrhea, unspecified: Secondary | ICD-10-CM | POA: Diagnosis not present

## 2022-06-03 DIAGNOSIS — Z832 Family history of diseases of the blood and blood-forming organs and certain disorders involving the immune mechanism: Secondary | ICD-10-CM | POA: Insufficient documentation

## 2022-06-03 DIAGNOSIS — D500 Iron deficiency anemia secondary to blood loss (chronic): Secondary | ICD-10-CM | POA: Diagnosis not present

## 2022-06-03 DIAGNOSIS — Z886 Allergy status to analgesic agent status: Secondary | ICD-10-CM | POA: Insufficient documentation

## 2022-06-03 DIAGNOSIS — Z9481 Bone marrow transplant status: Secondary | ICD-10-CM | POA: Insufficient documentation

## 2022-06-03 DIAGNOSIS — I5042 Chronic combined systolic (congestive) and diastolic (congestive) heart failure: Secondary | ICD-10-CM | POA: Insufficient documentation

## 2022-06-03 DIAGNOSIS — I130 Hypertensive heart and chronic kidney disease with heart failure and stage 1 through stage 4 chronic kidney disease, or unspecified chronic kidney disease: Secondary | ICD-10-CM | POA: Insufficient documentation

## 2022-06-03 DIAGNOSIS — G8929 Other chronic pain: Secondary | ICD-10-CM | POA: Diagnosis not present

## 2022-06-03 DIAGNOSIS — G620 Drug-induced polyneuropathy: Secondary | ICD-10-CM | POA: Insufficient documentation

## 2022-06-03 DIAGNOSIS — D709 Neutropenia, unspecified: Secondary | ICD-10-CM | POA: Insufficient documentation

## 2022-06-03 DIAGNOSIS — Z20822 Contact with and (suspected) exposure to covid-19: Secondary | ICD-10-CM | POA: Diagnosis not present

## 2022-06-03 DIAGNOSIS — Z803 Family history of malignant neoplasm of breast: Secondary | ICD-10-CM | POA: Insufficient documentation

## 2022-06-03 DIAGNOSIS — Z9484 Stem cells transplant status: Secondary | ICD-10-CM | POA: Insufficient documentation

## 2022-06-03 DIAGNOSIS — Z833 Family history of diabetes mellitus: Secondary | ICD-10-CM | POA: Insufficient documentation

## 2022-06-03 DIAGNOSIS — E876 Hypokalemia: Secondary | ICD-10-CM | POA: Insufficient documentation

## 2022-06-03 DIAGNOSIS — N1832 Chronic kidney disease, stage 3b: Secondary | ICD-10-CM | POA: Insufficient documentation

## 2022-06-03 DIAGNOSIS — D801 Nonfamilial hypogammaglobulinemia: Secondary | ICD-10-CM | POA: Diagnosis not present

## 2022-06-03 DIAGNOSIS — Z888 Allergy status to other drugs, medicaments and biological substances status: Secondary | ICD-10-CM | POA: Insufficient documentation

## 2022-06-03 DIAGNOSIS — C9001 Multiple myeloma in remission: Secondary | ICD-10-CM | POA: Insufficient documentation

## 2022-06-03 DIAGNOSIS — E538 Deficiency of other specified B group vitamins: Secondary | ICD-10-CM | POA: Diagnosis not present

## 2022-06-03 DIAGNOSIS — Z8379 Family history of other diseases of the digestive system: Secondary | ICD-10-CM | POA: Insufficient documentation

## 2022-06-03 DIAGNOSIS — T451X5A Adverse effect of antineoplastic and immunosuppressive drugs, initial encounter: Secondary | ICD-10-CM | POA: Diagnosis not present

## 2022-06-03 DIAGNOSIS — C9000 Multiple myeloma not having achieved remission: Secondary | ICD-10-CM

## 2022-06-03 DIAGNOSIS — Z808 Family history of malignant neoplasm of other organs or systems: Secondary | ICD-10-CM | POA: Insufficient documentation

## 2022-06-03 DIAGNOSIS — R112 Nausea with vomiting, unspecified: Secondary | ICD-10-CM | POA: Insufficient documentation

## 2022-06-03 DIAGNOSIS — Z9049 Acquired absence of other specified parts of digestive tract: Secondary | ICD-10-CM | POA: Insufficient documentation

## 2022-06-03 LAB — BASIC METABOLIC PANEL
Anion gap: 10 (ref 5–15)
BUN: 29 mg/dL — ABNORMAL HIGH (ref 8–23)
CO2: 25 mmol/L (ref 22–32)
Calcium: 9.5 mg/dL (ref 8.9–10.3)
Chloride: 103 mmol/L (ref 98–111)
Creatinine, Ser: 1.37 mg/dL — ABNORMAL HIGH (ref 0.44–1.00)
GFR, Estimated: 43 mL/min — ABNORMAL LOW (ref >60)
Glucose, Bld: 175 mg/dL — ABNORMAL HIGH (ref 70–99)
Potassium: 4.2 mmol/L (ref 3.5–5.1)
Sodium: 138 mmol/L (ref 135–145)

## 2022-06-03 LAB — MAGNESIUM: Magnesium: 1.3 mg/dL — ABNORMAL LOW (ref 1.7–2.4)

## 2022-06-03 MED ORDER — SODIUM CHLORIDE 0.9 % IV SOLN
25.0000 mg | Freq: Once | INTRAVENOUS | Status: AC
  Administered 2022-06-03: 25 mg via INTRAVENOUS
  Filled 2022-06-03: qty 1

## 2022-06-03 MED ORDER — SODIUM CHLORIDE 0.9% FLUSH
10.0000 mL | Freq: Once | INTRAVENOUS | Status: AC
  Administered 2022-06-03: 10 mL via INTRAVENOUS
  Filled 2022-06-03: qty 10

## 2022-06-03 MED ORDER — HEPARIN SOD (PORK) LOCK FLUSH 100 UNIT/ML IV SOLN
500.0000 [IU] | Freq: Once | INTRAVENOUS | Status: AC
  Administered 2022-06-03: 500 [IU] via INTRAVENOUS
  Filled 2022-06-03: qty 5

## 2022-06-03 MED ORDER — MAGNESIUM SULFATE 4 GM/100ML IV SOLN
4.0000 g | Freq: Once | INTRAVENOUS | Status: AC
  Administered 2022-06-03: 4 g via INTRAVENOUS
  Filled 2022-06-03: qty 100

## 2022-06-03 MED ORDER — SODIUM CHLORIDE 0.9 % IV SOLN: 6.0000 g | Freq: Once | INTRAVENOUS | Status: DC

## 2022-06-03 MED ORDER — MAGNESIUM SULFATE 2 GM/50ML IV SOLN
2.0000 g | Freq: Once | INTRAVENOUS | Status: AC
  Administered 2022-06-03: 2 g via INTRAVENOUS
  Filled 2022-06-03: qty 50

## 2022-06-03 MED ORDER — SODIUM CHLORIDE 0.9 % IV SOLN
Freq: Once | INTRAVENOUS | Status: AC
  Filled 2022-06-03: qty 250

## 2022-06-05 DIAGNOSIS — I493 Ventricular premature depolarization: Secondary | ICD-10-CM | POA: Diagnosis not present

## 2022-06-05 DIAGNOSIS — I350 Nonrheumatic aortic (valve) stenosis: Secondary | ICD-10-CM | POA: Diagnosis not present

## 2022-06-10 ENCOUNTER — Inpatient Hospital Stay: Payer: Medicare HMO

## 2022-06-10 ENCOUNTER — Encounter: Payer: Self-pay | Admitting: Oncology

## 2022-06-10 ENCOUNTER — Other Ambulatory Visit: Payer: Self-pay

## 2022-06-12 ENCOUNTER — Inpatient Hospital Stay: Payer: Medicare HMO

## 2022-06-12 ENCOUNTER — Other Ambulatory Visit: Payer: Medicare HMO

## 2022-06-12 VITALS — BP 115/73 | HR 73 | Temp 98.8°F | Resp 20

## 2022-06-12 DIAGNOSIS — R051 Acute cough: Secondary | ICD-10-CM

## 2022-06-12 DIAGNOSIS — R197 Diarrhea, unspecified: Secondary | ICD-10-CM | POA: Diagnosis not present

## 2022-06-12 DIAGNOSIS — Z95828 Presence of other vascular implants and grafts: Secondary | ICD-10-CM

## 2022-06-12 DIAGNOSIS — K30 Functional dyspepsia: Secondary | ICD-10-CM | POA: Diagnosis not present

## 2022-06-12 DIAGNOSIS — R112 Nausea with vomiting, unspecified: Secondary | ICD-10-CM | POA: Diagnosis not present

## 2022-06-12 DIAGNOSIS — T451X5A Adverse effect of antineoplastic and immunosuppressive drugs, initial encounter: Secondary | ICD-10-CM | POA: Diagnosis not present

## 2022-06-12 DIAGNOSIS — G620 Drug-induced polyneuropathy: Secondary | ICD-10-CM | POA: Diagnosis not present

## 2022-06-12 DIAGNOSIS — C9000 Multiple myeloma not having achieved remission: Secondary | ICD-10-CM

## 2022-06-12 DIAGNOSIS — C9001 Multiple myeloma in remission: Secondary | ICD-10-CM

## 2022-06-12 DIAGNOSIS — D500 Iron deficiency anemia secondary to blood loss (chronic): Secondary | ICD-10-CM | POA: Diagnosis not present

## 2022-06-12 DIAGNOSIS — E538 Deficiency of other specified B group vitamins: Secondary | ICD-10-CM | POA: Diagnosis not present

## 2022-06-12 DIAGNOSIS — E118 Type 2 diabetes mellitus with unspecified complications: Secondary | ICD-10-CM

## 2022-06-12 LAB — COMPREHENSIVE METABOLIC PANEL
ALT: 27 U/L (ref 0–44)
AST: 28 U/L (ref 15–41)
Albumin: 4.3 g/dL (ref 3.5–5.0)
Alkaline Phosphatase: 88 U/L (ref 38–126)
Anion gap: 10 (ref 5–15)
BUN: 30 mg/dL — ABNORMAL HIGH (ref 8–23)
CO2: 23 mmol/L (ref 22–32)
Calcium: 9 mg/dL (ref 8.9–10.3)
Chloride: 104 mmol/L (ref 98–111)
Creatinine, Ser: 1.23 mg/dL — ABNORMAL HIGH (ref 0.44–1.00)
GFR, Estimated: 49 mL/min — ABNORMAL LOW (ref >60)
Glucose, Bld: 142 mg/dL — ABNORMAL HIGH (ref 70–99)
Potassium: 4.6 mmol/L (ref 3.5–5.1)
Sodium: 137 mmol/L (ref 135–145)
Total Bilirubin: 0.4 mg/dL (ref 0.3–1.2)
Total Protein: 7.1 g/dL (ref 6.5–8.1)

## 2022-06-12 LAB — MAGNESIUM: Magnesium: 1.2 mg/dL — ABNORMAL LOW (ref 1.7–2.4)

## 2022-06-12 MED ORDER — SODIUM CHLORIDE 0.9 % IV SOLN: 4.0000 g | Freq: Once | INTRAVENOUS | Status: DC

## 2022-06-12 MED ORDER — MAGNESIUM SULFATE 4 GM/100ML IV SOLN
4.0000 g | Freq: Once | INTRAVENOUS | Status: AC
  Administered 2022-06-12: 4 g via INTRAVENOUS
  Filled 2022-06-12: qty 100

## 2022-06-12 MED ORDER — HEPARIN SOD (PORK) LOCK FLUSH 100 UNIT/ML IV SOLN
INTRAVENOUS | Status: AC
  Filled 2022-06-12: qty 5

## 2022-06-12 MED ORDER — SODIUM CHLORIDE 0.9 % IV SOLN
25.0000 mg | Freq: Once | INTRAVENOUS | Status: AC
  Administered 2022-06-12: 25 mg via INTRAVENOUS
  Filled 2022-06-12: qty 1

## 2022-06-12 MED ORDER — HEPARIN SOD (PORK) LOCK FLUSH 100 UNIT/ML IV SOLN
500.0000 [IU] | Freq: Once | INTRAVENOUS | Status: AC
  Administered 2022-06-12: 500 [IU] via INTRAVENOUS
  Filled 2022-06-12: qty 5

## 2022-06-12 MED ORDER — MAGNESIUM SULFATE 2 GM/50ML IV SOLN
2.0000 g | Freq: Once | INTRAVENOUS | Status: AC
  Administered 2022-06-12: 2 g via INTRAVENOUS
  Filled 2022-06-12: qty 50

## 2022-06-12 MED ORDER — SODIUM CHLORIDE 0.9% FLUSH
10.0000 mL | Freq: Once | INTRAVENOUS | Status: AC
  Administered 2022-06-12: 10 mL via INTRAVENOUS
  Filled 2022-06-12: qty 10

## 2022-06-12 MED ORDER — SODIUM CHLORIDE 0.9 % IV SOLN
Freq: Once | INTRAVENOUS | Status: AC
  Filled 2022-06-12: qty 250

## 2022-06-12 NOTE — Progress Notes (Signed)
Please see documentation on infusion encounter for port a cath access.

## 2022-06-13 LAB — HEMOGLOBIN A1C
Hgb A1c MFr Bld: 6.6 % — ABNORMAL HIGH (ref 4.8–5.6)
Mean Plasma Glucose: 143 mg/dL

## 2022-06-17 ENCOUNTER — Ambulatory Visit: Payer: Medicare HMO

## 2022-06-17 ENCOUNTER — Ambulatory Visit: Payer: Medicare HMO | Admitting: Oncology

## 2022-06-17 ENCOUNTER — Other Ambulatory Visit: Payer: Medicare HMO

## 2022-06-17 DIAGNOSIS — R200 Anesthesia of skin: Secondary | ICD-10-CM | POA: Diagnosis not present

## 2022-06-17 DIAGNOSIS — G620 Drug-induced polyneuropathy: Secondary | ICD-10-CM | POA: Diagnosis not present

## 2022-06-17 DIAGNOSIS — T451X5A Adverse effect of antineoplastic and immunosuppressive drugs, initial encounter: Secondary | ICD-10-CM | POA: Diagnosis not present

## 2022-06-17 DIAGNOSIS — R202 Paresthesia of skin: Secondary | ICD-10-CM | POA: Diagnosis not present

## 2022-06-17 DIAGNOSIS — M542 Cervicalgia: Secondary | ICD-10-CM | POA: Diagnosis not present

## 2022-06-17 DIAGNOSIS — M545 Low back pain, unspecified: Secondary | ICD-10-CM | POA: Diagnosis not present

## 2022-06-18 ENCOUNTER — Ambulatory Visit: Payer: Medicare HMO

## 2022-06-18 ENCOUNTER — Other Ambulatory Visit: Payer: Self-pay

## 2022-06-18 ENCOUNTER — Inpatient Hospital Stay: Payer: Medicare HMO

## 2022-06-18 ENCOUNTER — Encounter: Payer: Self-pay | Admitting: Oncology

## 2022-06-18 ENCOUNTER — Other Ambulatory Visit: Payer: Medicare HMO

## 2022-06-18 ENCOUNTER — Inpatient Hospital Stay (HOSPITAL_BASED_OUTPATIENT_CLINIC_OR_DEPARTMENT_OTHER): Payer: Medicare HMO | Admitting: Oncology

## 2022-06-18 VITALS — BP 125/76 | HR 73 | Temp 97.6°F | Resp 17 | Wt 155.9 lb

## 2022-06-18 DIAGNOSIS — C9001 Multiple myeloma in remission: Secondary | ICD-10-CM

## 2022-06-18 DIAGNOSIS — R112 Nausea with vomiting, unspecified: Secondary | ICD-10-CM | POA: Diagnosis not present

## 2022-06-18 DIAGNOSIS — K30 Functional dyspepsia: Secondary | ICD-10-CM | POA: Diagnosis not present

## 2022-06-18 DIAGNOSIS — R110 Nausea: Secondary | ICD-10-CM

## 2022-06-18 DIAGNOSIS — D500 Iron deficiency anemia secondary to blood loss (chronic): Secondary | ICD-10-CM | POA: Diagnosis not present

## 2022-06-18 DIAGNOSIS — E538 Deficiency of other specified B group vitamins: Secondary | ICD-10-CM

## 2022-06-18 DIAGNOSIS — R771 Abnormality of globulin: Secondary | ICD-10-CM

## 2022-06-18 DIAGNOSIS — C9000 Multiple myeloma not having achieved remission: Secondary | ICD-10-CM

## 2022-06-18 DIAGNOSIS — G620 Drug-induced polyneuropathy: Secondary | ICD-10-CM | POA: Diagnosis not present

## 2022-06-18 DIAGNOSIS — T451X5A Adverse effect of antineoplastic and immunosuppressive drugs, initial encounter: Secondary | ICD-10-CM | POA: Diagnosis not present

## 2022-06-18 DIAGNOSIS — R197 Diarrhea, unspecified: Secondary | ICD-10-CM | POA: Diagnosis not present

## 2022-06-18 LAB — COMPREHENSIVE METABOLIC PANEL
ALT: 25 U/L (ref 0–44)
AST: 26 U/L (ref 15–41)
Albumin: 4.3 g/dL (ref 3.5–5.0)
Alkaline Phosphatase: 76 U/L (ref 38–126)
Anion gap: 9 (ref 5–15)
BUN: 28 mg/dL — ABNORMAL HIGH (ref 8–23)
CO2: 24 mmol/L (ref 22–32)
Calcium: 8.9 mg/dL (ref 8.9–10.3)
Chloride: 104 mmol/L (ref 98–111)
Creatinine, Ser: 1.17 mg/dL — ABNORMAL HIGH (ref 0.44–1.00)
GFR, Estimated: 52 mL/min — ABNORMAL LOW (ref >60)
Glucose, Bld: 112 mg/dL — ABNORMAL HIGH (ref 70–99)
Potassium: 4.3 mmol/L (ref 3.5–5.1)
Sodium: 137 mmol/L (ref 135–145)
Total Bilirubin: 0.4 mg/dL (ref 0.3–1.2)
Total Protein: 7.2 g/dL (ref 6.5–8.1)

## 2022-06-18 LAB — CBC WITH DIFFERENTIAL/PLATELET
Abs Immature Granulocytes: 0.02 10*3/uL (ref 0.00–0.07)
Basophils Absolute: 0 10*3/uL (ref 0.0–0.1)
Basophils Relative: 0 %
Eosinophils Absolute: 0.1 10*3/uL (ref 0.0–0.5)
Eosinophils Relative: 2 %
HCT: 35.8 % — ABNORMAL LOW (ref 36.0–46.0)
Hemoglobin: 12.1 g/dL (ref 12.0–15.0)
Immature Granulocytes: 0 %
Lymphocytes Relative: 20 %
Lymphs Abs: 1.2 10*3/uL (ref 0.7–4.0)
MCH: 28.6 pg (ref 26.0–34.0)
MCHC: 33.8 g/dL (ref 30.0–36.0)
MCV: 84.6 fL (ref 80.0–100.0)
Monocytes Absolute: 0.5 10*3/uL (ref 0.1–1.0)
Monocytes Relative: 8 %
Neutro Abs: 4.4 10*3/uL (ref 1.7–7.7)
Neutrophils Relative %: 70 %
Platelets: 124 10*3/uL — ABNORMAL LOW (ref 150–400)
RBC: 4.23 MIL/uL (ref 3.87–5.11)
RDW: 16.5 % — ABNORMAL HIGH (ref 11.5–15.5)
WBC: 6.3 10*3/uL (ref 4.0–10.5)
nRBC: 0 % (ref 0.0–0.2)

## 2022-06-18 LAB — MAGNESIUM: Magnesium: 1.4 mg/dL — ABNORMAL LOW (ref 1.7–2.4)

## 2022-06-18 MED ORDER — SODIUM CHLORIDE 0.9 % IV SOLN: 4.0000 g | Freq: Once | INTRAVENOUS | Status: DC

## 2022-06-18 MED ORDER — HEPARIN SOD (PORK) LOCK FLUSH 100 UNIT/ML IV SOLN
500.0000 [IU] | Freq: Once | INTRAVENOUS | Status: AC
  Administered 2022-06-18: 500 [IU] via INTRAVENOUS
  Filled 2022-06-18: qty 5

## 2022-06-18 MED ORDER — SODIUM CHLORIDE 0.9 % IV SOLN
25.0000 mg | Freq: Once | INTRAVENOUS | Status: AC
  Administered 2022-06-18: 25 mg via INTRAVENOUS
  Filled 2022-06-18: qty 1

## 2022-06-18 MED ORDER — SODIUM CHLORIDE 0.9% FLUSH
10.0000 mL | Freq: Once | INTRAVENOUS | Status: AC
  Administered 2022-06-18: 10 mL via INTRAVENOUS
  Filled 2022-06-18: qty 10

## 2022-06-18 MED ORDER — MAGNESIUM SULFATE 4 GM/100ML IV SOLN
4.0000 g | Freq: Once | INTRAVENOUS | Status: AC
  Administered 2022-06-18: 4 g via INTRAVENOUS
  Filled 2022-06-18: qty 100

## 2022-06-18 MED ORDER — PROMETHAZINE HCL 25 MG PO TABS: 25.0000 mg | ORAL_TABLET | Freq: Four times a day (QID) | ORAL | 1 refills | Status: DC | PRN

## 2022-06-18 MED ORDER — ACETAMINOPHEN 325 MG PO TABS: 650.0000 mg | ORAL_TABLET | Freq: Four times a day (QID) | ORAL | Status: DC | PRN

## 2022-06-18 MED ORDER — SODIUM CHLORIDE 0.9 % IV SOLN
Freq: Once | INTRAVENOUS | Status: AC
  Filled 2022-06-18: qty 250

## 2022-06-18 MED ORDER — DIPHENHYDRAMINE HCL 25 MG PO CAPS: 25.0000 mg | ORAL_CAPSULE | Freq: Four times a day (QID) | ORAL | Status: DC | PRN

## 2022-06-18 MED ORDER — CYANOCOBALAMIN 1000 MCG/ML IJ SOLN
1000.0000 ug | Freq: Once | INTRAMUSCULAR | Status: AC
  Administered 2022-06-18: 1000 ug via INTRAMUSCULAR
  Filled 2022-06-18: qty 1

## 2022-06-18 NOTE — Assessment & Plan Note (Signed)
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   Previously on Lyrica and nortriptyline, she declined due to didn't notice much improvement   Neurology recommend Tramadol and may consider higher dose of  Nortriptyline in the future. Follows up with neurology.

## 2022-06-18 NOTE — Assessment & Plan Note (Signed)
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  Light chain ratio is normal. 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 Repeat immunofixation negative. . Labs reviewed and discussed with patient, stable M protein and slightly increased light chain ratio Today's level is pending. We discussed about resuming Daratumumab if her myeloma progresses. .  Daratumumab on hold due to frequent infections despite IVIG Continue acyclovir prophylaxis.

## 2022-06-18 NOTE — Assessment & Plan Note (Signed)
#  Chronic hypomagnesemia proceed with  IV magnesium today.  Continue weekly magnesium +/- IV magnesium.

## 2022-06-18 NOTE — Progress Notes (Signed)
Patient here for oncology follow-up appointment, expresses concerns of back pain. Phenergan rx request sent to [facility]

## 2022-06-18 NOTE — Assessment & Plan Note (Signed)
PRN IVIG infusion if IgG level < 500.

## 2022-06-18 NOTE — Assessment & Plan Note (Signed)
S/p Venofer weekly x 4 Hb has improved. Observation. .   

## 2022-06-18 NOTE — Progress Notes (Signed)
Hematology/Oncology Progress note [phone] [phone]        Clinic Day:  06/18/2022   ASSESSMENT & PLAN:   Multiple myeloma in remission ([place]) # Recurrent lambda light chain multiple myeloma s/p second autologous bone marrow transplant [05/10/2020] Most recent Multiple myeloma showed negative M protein.  Light chain ratio is normal. 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 Repeat immunofixation negative. . Labs reviewed and discussed with patient, stable M protein and slightly increased light chain ratio Today's level is pending. We discussed about resuming Daratumumab if her myeloma progresses. .  Daratumumab on hold due to frequent infections despite IVIG Continue acyclovir prophylaxis.    B12 deficiency Continue B12 monthly injection. Repeat B12 level is pending.   Hypoglobulinemia PRN IVIG infusion if IgG level < 500.  Hypomagnesemia #Chronic hypomagnesemia proceed with  IV magnesium today.  Continue weekly magnesium +/- IV magnesium.     Iron deficiency anemia due to chronic blood loss S/p Venofer weekly x 4 Hb has improved. Observation. .    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   Previously on Lyrica and nortriptyline, she declined due to didn't notice much improvement   Neurology recommend Tramadol and may consider higher dose of  Nortriptyline in the future. Follows up with neurology.   Orders Placed This Encounter  Procedures   Vitamin B12    Standing Status:   Future    Standing Expiration Date:   06/19/2023   Follow up  Per LOS All questions were answered. The patient knows to call the clinic with any problems, questions or concerns.  Earlie Server, MD, PhD [facility] Hematology Oncology 06/18/2022    Chief Complaint: Sarah [name] is a 66 y.o. female with lambda light  chain multiple myeloma s/p autologous stem cell transplant (2016 and 2021) who is seen for follow up .    PERTINENT ONCOLOGY HISTORY Sarah [name] is a 66 y.o.afemale who has above oncology history reviewed by me today presented for follow up visit for management of multiple myeloma Patient previously followed up by Dr.Corcoran, patient switched care to me on 11/23/20 Extensive medical record review was performed by me  stage III IgA lambda light chain multiple myeloma s/p autologous stem cell transplant on 06/14/2015 at the [place] and second autologous stem cell transplant on 05/10/2020 at [facility].   Initial bone marrow revealed 80% plasma cells.  Lambda free light chains were 1340.  She had nephrotic range proteinuria.  She initially underwent induction with RVD.  Revlimid maintenance was discontinued on 01/21/2017 secondary to intolerance.     07/19/2019 Bone marrow aspirate and biopsy on  revealed a normocellular marrow with but increased lambda-restricted plasma cells (9% aspirate, 40% CD138 immunohistochemistry).  Findings were consistent with recurrent plasma cell myeloma.  Flow cytometry revealed no monoclonal B-cell or phenotypically aberrant T-cell population. Cytogenetics were 29, XX (normal).  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    Lambda light chains have been followed: 22.2 (ratio 0.56) on 07/03/2017, 30.8 (ratio 0.78) on 09/02/2017, 36.9 (ratio 0.40) on 10/21/2017, 37.4 (ratio 0.41) on 12/16/2017, 70.7(ratio 0.31)  on 02/17/2018, 64.2 (ratio 0.27) on 04/07/2018, 78.9 (ratio 0.18) on 05/26/2018, 128.8 (ratio 0.17) on 08/06/2018, 181.5 (ratio 0.13) on 10/08/2018, 130.9 (ratio 0.13) on 10/20/2018, 160.7 (ratio 0.10) on 12/09/2018, 236.6 (ratio 0.07) on 02/01/2019, 363.6 (ratio 0.04) on 03/22/2019, 404.8 (ratio 0.04) on 04/05/2019, 420.7 (ratio  0.03) on 05/24/2019, 573.4 (ratio 0.03) on 06/23/2019, 451.05 (ratio 0.02) on 08/20/2019, 47.2 (ratio 0.13) on  10/25/2019, 22.4 (ratio 0.21) on 11/25/2019, 16.5 (ratio 0.33) on 01/10/2020, 14.6 (ratio 0.34) on 02/07/2020, 13.1 (ratio 0.31) on 03/07/2020, 10.1 (ratio 0.38) on 04/10/2020, and 9.5 (ratio 0.21) on 06/19/2020.   24 hour UPEP on 06/03/2019 revealed kappa free light chains 95.76, lambda free light chains 1,260.71, and ratio 0.08.  24 hour UPEP on 08/23/2019 revealed total protein of 782 mg/24 hrs with lambda free light chains 1,084.16 mg/L and ratio of 0.10 (1.03-31.76).  M spike in urine was 46.1% (361 mg/24 hrs).    Bone survey on 04/08/2016 and 05/28/2017 revealed no definite lytic lesion seen in the visualized skeleton.  Bone survey on 11/19/2018 revealed no suspicious lucent lesions and no acute bony abnormality.  PET scan on 07/12/2019 revealed no focal metabolic activity to suggest active myeloma within the skeleton. There were no lytic lesions identified on the CT portion of the exam or soft tissue plasmacytomas. There was no evidence of multiple myeloma.    Pretreatment RBC phenotype on 09/23/2019 was positive for C, e, DUFFY B, KIDD B, M, S, and s antigen; negative for c, E, KELL, DUFFY A, KIDD A, and N antigen.    09/27/2019 - 10/25/2019; 12/09/2019 - 03/13/2020 6 cycles of daratumumab and hyaluronidase-fihj, Pomalyst, and Decadron (DPd) .  Cycle #1 was complicated by fever and neutropenia requiring admission.  Cycle #2 was complicated by pneumonia requiring admission.  Cycle #6 was complicated with an ER evaluation for an elevated lactic acid.   05/10/2020 second autologous stem cell transplant [facility]   She underwent conditioning with melphalan 140 mg/m2.    She is s/p week #3 Velcade maintenance (began 08/31/2020 - 09/28/2020).  She has no increase in neuropathy.  She has severe aortic stenosis.  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 Cardiology is following for a possible TAVR in the future.  Echo on  07/05/2020 revealed moderate to severe aortic stenosis with an EF of 50-55%.  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 Zometa was discontinued in 01/2017.  She has chronic nausea on Phenergan.     B12 deficiency.  B12 was 254 on 04/09/2017, 295 on 08/20/2018, and 391 on 10/08/2018.  She was on oral B12.  She received B12 monthly (last 09/14/2020).  Folate was 12.1 on 01/10/2020.    She has iron deficiency.  Ferritin was 32 on 07/01/2019.  She received Venofer on 07/15/2019 and 07/22/2019.   She has hypogammaglobulinemia.  IgG was 245 on 11/25/2019.  She received monthly IVIG (07/22/021 - 03/22/2020).  She received IVIG 400 mg/kg on 01/20/2020, 200 mg/kg on 02/17/2020, and 300 mg/kg on 03/22/2020.  IVIG on 48/18/5631 was complicated by acute renal failure. IgG trough level was 418 on 02/17/2020.  10/15/2019 - 10/20/2019 She was admitted to [facility] with fever and neutropenia.  Cultures were negative.  CXR was negative. She received broad spectrum antibiotics and daily Granix.  She received IVF for acute renal insufficiency due to diarrhea and dehydration.  Creatine was 1.66 on admission and 1.12 on discharge.    03/01/2020 - 03/05/2020 admitted to [facility] from with fever and neutropenia. CXR revealed no active cardiopulmonary disease. Chest CT with contrast revealed no acute intrathoracic pathology. There were findings which could be suggestive of prior granulomatous disease. She was treated with Cefepime and Vancomycin, then switched to  ciprofloxacin on 03/03/2020.   05/08/2020 - 12/05/2021The patient was admitted to [facility] from  for autologous bone marrow transplant.   She received conditioning with melphalan 140 mg/m2.  Bone marrow transplant was on 05/10/2020. The patient developed fevers daily from 05/19/2020 - 05/24/2020. Blood cultures were + for strept sanguis bacteremia.  She was treated cefepime, vancomycin, and solumedrol for possible engraftment syndrome. Cefepime was  switched to ceftriaxone; she completed 2 weeks of ceftriaxone on 06/03/2020. She had chemotherapy induced diarrhea.  She experienced urinary retention.  Feb 2022 patient has been on maintenance Velcade 2 mg every 2 weeks.  Chronic diarrhea seen by gastroenterology Dr. Vicente Males and was recommended to avoid artificial sugars in her diet including Diet Coke and coffee mate.  She feels some improvement of her diarrhea frequency since the dietary modification.  GI profile PCR negative, C. difficile toxin A+ B negative, fecal calprotectin 77 12/19/2020 colonoscopy showed 2 subcentimeter polyps in the ascending colon, resected and removed.  Pathology showed tubular adenoma.  No malignancy.  Normal mucosa in the entire examined colon.  Biopsied, pathology showed colonic mucosa with no significant pathological alteration.  Negative for active inflammation and features of chronicity.  Negative for microscopic colitis, dysplasia, and malignanc  Diabetes, patient has discontinued metformin due to diarrhea and started on glipizide 2.5 mg   03/05/2021-03/09/2021 Patient was hospitalized due to fever and chills, nonproductive cough, shortness of breath. X-ray showed right lower lobe atelectasis versus pneumonia.  Blood cultures negative.  Urine culture showed 60,000 colonies of Enterococcus facialis.  Patient received antibiotics. Patient also had abdominal pelvis CT scan for work-up of intermittent lower abdomen pain.No acute abnormality.   05/14/21 last dose of Velcade maintenance.  establish care with neurology Dr. Gurney Maxin and her neuropathy regimen has been switched from gabapentin to Lyrica. 05/29/2021 patient got influenza   neuropathy medication has been switched from gabapentin/Lyrica to nortriptyline.  08/18/2021 Hospitalized due to pneumonia from metapnuemovirus, hospitalization was complicated with NSTEMI. She received heparin gtt.  Treated with antibiotics for coverage of pneumonia,  diuretics for  pulmonary edema. She received IVIG 454m/kg x 1 for hypoglobulinemia. Discharged on 08/25/21 with Augmentin and Zithromax.   May 2023, Daratumumab being held for [facility] infectious disease physician Dr. GFatima Sangerdue to frequent infection.  Diagnosed with CHF [HFrEF] -NYHA class II-III, she follows up with cardiology, started on entresto  hospitalization due to CHF exacerbation, community acquired pneumonia.   INTERVAL HISTORY CKrystin Keevenis a 66y.o. female who has above history reviewed by me today presents for follow up visit for management of multiple myeloma.  Patient has been on weekly magnesium infusion as needed hypomagnesemia + CHF [nonischemic cardiomyopathy], aortic stenosis, follows up with cardiology.  Past Medical History:  Diagnosis Date   Anemia    Anxiety    Aortic stenosis    a. 05/2020 Echo: EF 45-50%, sev AS - [facility] CTA sugg of tricuspid valve w/ fusing of 2 leaflets-TAVR deferred in setting of acute infxn; b. 07/2020 Echo: EF 50-55%, mod-sev AS; c. 12/2020 Echo: EF>55%. Mod-sev paradoxical low-flow low-gradient AS; d. 08/2021 Echo: EF 35-40%, severe AS, triv AI, mild MR.   Arthritis    Bicuspid aortic valve    Bisphosphonate[place] of the [place] 02/25[place]   Due to Zometa   Cardiomyopathy, idiopathic ([place]    a. Variable EF over time; b. 08/2017 Echo: EF 40%; b. 03/2020 Echo: EF 55-60%; c. 05/2020 Echo: EF 45-50%; d. 07/2020 Echo: EF  50-55%; e. 12/2020 Echo: EF>55%; f. 08/2021 Echo: EF 35-40%.   Chronic heart failure with preserved ejection fraction (HFpEF) ([place])    a. Variable EF over time; b. 08/2017 Echo: EF 40%; b. 03/2020 Echo: EF 55-60%; c. 05/2020 Echo: EF 45-50%; d. 07/2020 Echo: EF 50-55%; e. 12/2020 Echo: EF>55%; f. 08/2021 Echo: EF 35-40%, no RV, mildly dil LA, mild MR, triv AI, severe AS.   CKD (chronic kidney disease) stage 3, GFR 30-59 ml/min (HCC)    Depression    Diabetes mellitus (HCC)    Dizziness    Fatty liver     Frequent falls    GERD (gastroesophageal reflux disease)    Gout    Heart murmur    History of blood transfusion    History of bone marrow transplant ([place])    History of uterine fibroid    Hx of cardiac catheterization    a. 01/2016 Cath [facility] - after abnl nuc): Nl cors.   Hypertension    Hypomagnesemia    IDA (iron deficiency anemia)    Multiple myeloma (HCC)    Personal history of chemotherapy    PSVT (paroxysmal supraventricular tachycardia)    a. S/p [place] [facility]).   PVC's (premature ventricular contractions)    a. Well-managed w/ bisoprolol in outpt setting.   Renal cyst     Past Surgical History:  Procedure Laterality Date   ABDOMINAL HYSTERECTOMY     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   CARDIAC ELECTROPHYSIOLOGY MAPPING AND ABLATION     CARPAL TUNNEL RELEASE Bilateral    CHOLECYSTECTOMY  2008   COLONOSCOPY WITH PROPOFOL N/A 05/07/2017   Procedure: COLONOSCOPY WITH PROPOFOL;  Surgeon: [name], MD;  Location: [facility] ENDOSCOPY;  Service: Gastroenterology;  Laterality: N/A;   COLONOSCOPY WITH PROPOFOL N/A 12/19/2020   Procedure: COLONOSCOPY WITH PROPOFOL;  Surgeon: [name], MD;  Location: [facility] ENDOSCOPY;  Service: Gastroenterology;  Laterality: N/A;   ESOPHAGOGASTRODUODENOSCOPY (EGD) WITH PROPOFOL N/A 05/07/2017   Procedure: ESOPHAGOGASTRODUODENOSCOPY (EGD) WITH PROPOFOL;  Surgeon: [name], MD;  Location: [facility] ENDOSCOPY;  Service: Gastroenterology;  Laterality: N/A;   FOOT SURGERY Bilateral    INCONTINENCE SURGERY  2009   INTERSTIM IMPLANT PLACEMENT     other     over active bladder   OTHER SURGICAL HISTORY     bladder stimulator    PARTIAL HYSTERECTOMY  03/1996   fibroids   PORTA CATH INSERTION N/A 03/10/2019   Procedure: PORTA CATH INSERTION;  Surgeon: Algernon Huxley, MD;  Location: [place] CV LAB;  Service: Cardiovascular;  Laterality: N/A;   TONSILLECTOMY  2007    Family History  Problem Relation Age of Onset   Colon cancer Father    Renal  Disease Father    Diabetes Mellitus II Father    Melanoma Paternal Grandmother    Breast cancer Maternal Aunt 43   Anemia Mother    Heart disease Mother    Heart failure Mother    Renal Disease Mother    Congestive Heart Failure Mother    Heart disease Maternal Uncle    Throat cancer Maternal Uncle    Lung cancer Maternal Uncle    Liver disease Maternal Uncle    Heart failure Maternal Uncle    Hearing loss Son 50       Suicide     Social History:  reports that she quit smoking about 30 years ago. Her smoking use included cigarettes. She has a 20.00 pack-year smoking history. She has never used smokeless  tobacco. She reports current alcohol use of about 2.0 standard drinks of alcohol per week. She reports that she does not use drugs.  She is on disability. She notes exposure to perchloroethylene [facility]).   Allergies:  Allergies  Allergen Reactions   Oxycodone-Acetaminophen Anaphylaxis    Swelling and rash   Celebrex [Celecoxib] Diarrhea   Codeine    Plerixafor     In 2016 during ASCT collection patient developed fever to 103.42F and required hospitalization   Benadryl [Diphenhydramine] Palpitations   Morphine Itching and Rash   Ondansetron Diarrhea   Tylenol [Acetaminophen] Itching and Rash    Current Medications: Current Outpatient Medications  Medication Sig Dispense Refill   acyclovir (ZOVIRAX) 400 MG tablet Take 1 tablet (400 mg total) by mouth 2 (two) times daily. 180 tablet 1   atorvastatin (LIPITOR) 10 MG tablet Take 0.5 tablets (5 mg total) by mouth daily. 15 tablet 11   bisoprolol (ZEBETA) 10 MG tablet Take 1 tablet (10 mg total) by mouth daily. 90 tablet 1   calcium carbonate (OS-CAL) 1250 (500 Ca) MG chewable tablet Chew 1 tablet by mouth daily.     diclofenac sodium (VOLTAREN) 1 % GEL Apply 2 g topically 4 (four) times daily. 100 g 1   DULoxetine (CYMBALTA) 60 MG capsule TAKE 1 CAPSULE(60 MG) BY MOUTH DAILY 90 capsule 0   empagliflozin (JARDIANCE) 10 MG TABS  tablet Take 1 tablet by mouth daily.     ENTRESTO 24-26 MG Take 1 tablet by mouth 2 (two) times daily.     FLUoxetine (PROZAC) 40 MG capsule TAKE 1 CAPSULE EVERY DAY 90 capsule 0   furosemide (LASIX) 40 MG tablet Take 40 mg by mouth daily.     glipiZIDE (GLUCOTROL XL) 2.5 MG 24 hr tablet Take 1 tablet (2.5 mg total) by mouth daily with breakfast. 30 tablet 0   glucose blood test strip      guaiFENesin-dextromethorphan (ROBITUSSIN DM) 100-10 MG/5ML syrup Take 5 mLs by mouth every 4 (four) hours as needed for cough. 118 mL 0   montelukast (SINGULAIR) 10 MG tablet Take 1 tablet (10 mg total) by mouth See admin instructions. Take 1 tab daily for 2 days after daratumumab treatments 30 tablet 1   Multiple Vitamin (MULTIVITAMIN) tablet Take 1 tablet by mouth daily.     omeprazole (PRILOSEC) 40 MG capsule TAKE 1 CAPSULE IN THE MORNING AND TAKE 1 CAPSULE AT BEDTIME 180 capsule 1   tiZANidine (ZANAFLEX) 4 MG tablet Take 1 tablet (4 mg total) by mouth every 6 (six) hours as needed for muscle spasms. 90 tablet 0   traMADol (ULTRAM) 50 MG tablet Take 50 mg by mouth 2 (two) times daily.     traZODone (DESYREL) 100 MG tablet TAKE 1 TABLET AT BEDTIME 90 tablet 0   vitamin E 45 MG (100 UNITS) capsule Take by mouth daily.     allopurinol (ZYLOPRIM) 100 MG tablet TAKE 1 TABLET(100 MG) BY MOUTH DAILY as needed (Patient not taking: Reported on 04/22/2022) 90 tablet 1   diphenoxylate-atropine (LOMOTIL) 2.5-0.025 MG tablet Take 2 tablets by mouth 4 (four) times daily as needed for diarrhea or loose stools. (Patient not taking: Reported on 04/22/2022) 64 tablet 3   nitroGLYCERIN (NITROSTAT) 0.4 MG SL tablet Place 1 tablet (0.4 mg total) under the tongue every 5 (five) minutes as needed for chest pain. (Patient not taking: Reported on 03/25/2022) 20 tablet 12   promethazine (PHENERGAN) 25 MG tablet Take 1 tablet (25 mg total) by mouth  every 6 (six) hours as needed for nausea or vomiting. 90 tablet 1   No current  facility-administered medications for this visit.   Facility-Administered Medications Ordered in Other Visits  Medication Dose Route Frequency Provider Last Rate Last Admin   sodium chloride flush (NS) 0.9 % injection 10 mL  10 mL Intravenous PRN Earlie Server, MD   10 mL at 04/02/21 3013    Review of Systems  Constitutional:  Negative for chills, fever, malaise/fatigue and weight loss.  HENT:  Negative for sore throat.   Eyes:  Negative for redness.  Respiratory:  Negative for cough, shortness of breath and wheezing.   Cardiovascular:  Negative for chest pain, palpitations and leg swelling.  Gastrointestinal:  Positive for diarrhea. Negative for abdominal pain, blood in stool, nausea and vomiting.  Genitourinary:  Negative for dysuria.  Musculoskeletal:  Positive for back pain and joint pain. Negative for myalgias.  Skin:  Negative for rash.  Neurological:  Positive for tingling and sensory change. Negative for dizziness and tremors.  Endo/Heme/Allergies:  Does not bruise/bleed easily.  Psychiatric/Behavioral:  Negative for hallucinations.     Performance status (ECOG): 1  Vitals Blood pressure 125/76, pulse 73, temperature 97.6 F (36.4 C), temperature source Tympanic, resp. rate 17, weight 155 lb 14.4 oz (70.7 kg), SpO2 98 %.  Physical Exam Constitutional:      General: She is not in acute distress.    Appearance: She is not diaphoretic.  HENT:     Head: Normocephalic and atraumatic.     Nose: Nose normal.     Mouth/Throat:     Pharynx: No oropharyngeal exudate.  Eyes:     General: No scleral icterus.    Pupils: Pupils are equal, round, and reactive to light.  Cardiovascular:     Rate and Rhythm: Normal rate.     Heart sounds: Murmur heard.  Pulmonary:     Effort: Pulmonary effort is normal. No respiratory distress.     Breath sounds: No rales.  Chest:     Chest wall: No tenderness.  Abdominal:     General: There is no distension.     Palpations: Abdomen is soft.      Tenderness: There is no abdominal tenderness.  Musculoskeletal:        General: Normal range of motion.     Cervical back: Normal range of motion and neck supple.  Skin:    General: Skin is warm and dry.     Findings: No erythema.  Neurological:     Mental Status: She is alert and oriented to person, place, and time.     Cranial Nerves: No cranial nerve deficit.     Motor: No abnormal muscle tone.     Coordination: Coordination normal.  Psychiatric:        Mood and Affect: Mood and affect normal.      Laboratory data    Latest Ref Rng & Units 06/18/2022   10:40 AM 05/20/2022    9:02 AM 04/22/2022    9:48 AM  CBC  WBC 4.0 - 10.5 K/uL 6.3  5.8  6.2   Hemoglobin 12.0 - 15.0 g/dL 12.1  11.7  10.6   Hematocrit 36.0 - 46.0 % 35.8  36.4  33.1   Platelets 150 - 400 K/uL 124  119  127       Latest Ref Rng & Units 06/18/2022   10:40 AM 06/12/2022    9:05 AM 06/03/2022    8:19 AM  CMP  Glucose  70 - 99 mg/dL 112  142  175   BUN 8 - 23 mg/dL _0 Creatinine 0.44 - 1.00 mg/dL 1.17  1.23  1.37   Sodium 135 - 145 mmol/L 137  137  138   Potassium 3.5 - 5.1 mmol/L 4.3  4.6  4.2   Chloride 98 - 111 mmol/L 104  104  103   CO2 22 - 32 mmol/L _1 Calcium 8.9 - 10.3 mg/dL 8.9  9.0  9.5   Total Protein 6.5 - 8.1 g/dL 7.2  7.1    Total Bilirubin 0.3 - 1.2 mg/dL 0.4  0.4    Alkaline Phos 38 - 126 U/L 76  88    AST 15 - 41 U/L 26  28    ALT 0 - 44 U/L 25  27

## 2022-06-18 NOTE — Assessment & Plan Note (Addendum)
Continue B12 monthly injection. Repeat B12 level is pending.

## 2022-06-18 NOTE — Patient Instructions (Signed)
[facility] CTR AT [place]  Discharge Instructions: Thank you for choosing [facility] to provide your oncology and hematology care.  If you have a lab appointment with the [facility], please go directly to the [facility] and check in at the registration area.  Wear comfortable clothing and clothing appropriate for easy access to any Portacath or PICC line.   We strive to give you quality time with your provider. You may need to reschedule your appointment if you arrive late (15 or more minutes).  Arriving late affects you and other patients whose appointments are after yours.  Also, if you miss three or more appointments without notifying the office, you may be dismissed from the clinic at the provider's discretion.      For prescription refill requests, have your pharmacy contact our office and allow 72 hours for refills to be completed.    Today you received the following: Magnesium, B12 & Phenergan    To help prevent nausea and vomiting after your treatment, we encourage you to take your nausea medication as directed.  BELOW ARE SYMPTOMS THAT SHOULD BE REPORTED IMMEDIATELY: *FEVER GREATER THAN 100.4 F (38 C) OR HIGHER *CHILLS OR SWEATING *NAUSEA AND VOMITING THAT IS NOT CONTROLLED WITH YOUR NAUSEA MEDICATION *UNUSUAL SHORTNESS OF BREATH *UNUSUAL BRUISING OR BLEEDING *URINARY PROBLEMS (pain or burning when urinating, or frequent urination) *BOWEL PROBLEMS (unusual diarrhea, constipation, pain near the anus) TENDERNESS IN MOUTH AND THROAT WITH OR WITHOUT PRESENCE OF ULCERS (sore throat, sores in mouth, or a toothache) UNUSUAL RASH, SWELLING OR PAIN  UNUSUAL VAGINAL DISCHARGE OR ITCHING   Items with * indicate a potential emergency and should be followed up as soon as possible or go to the Emergency Department if any problems should occur.  Please show the CHEMOTHERAPY ALERT CARD or IMMUNOTHERAPY ALERT CARD at check-in to the Emergency Department  and triage nurse.  Should you have questions after your visit or need to cancel or reschedule your appointment, please contact [facility] CANCER [place] AT [place]  [phone] and follow the prompts.  Office hours are 8:00 a.m. to 4:30 p.m. Monday - Friday. Please note that voicemails left after 4:00 p.m. may not be returned until the following business day.  We are closed weekends and major holidays. You have access to a nurse at all times for urgent questions. Please call the main number to the clinic [phone] and follow the prompts.  For any non-urgent questions, you may also contact your provider using MyChart. We now offer e-Visits for anyone 59 and older to request care online for non-urgent symptoms. For details visit [email or web address].   Also download the MyChart app! Go to the app store, search "MyChart", open the app, select [place], and log in with your MyChart username and password.  Masks are optional in the cancer centers. If you would like for your care team to wear a mask while they are taking care of you, please let them know. For doctor visits, patients may have with them one support person who is at least 65 years old. At this time, visitors are not allowed in the infusion area.

## 2022-06-19 ENCOUNTER — Encounter: Payer: Self-pay | Admitting: Internal Medicine

## 2022-06-19 ENCOUNTER — Encounter: Payer: Self-pay | Admitting: Oncology

## 2022-06-19 LAB — KAPPA/LAMBDA LIGHT CHAINS
Kappa free light chain: 17 mg/L (ref 3.3–19.4)
Kappa, lambda light chain ratio: 1.16 (ref 0.26–1.65)
Lambda free light chains: 14.7 mg/L (ref 5.7–26.3)

## 2022-06-19 LAB — VITAMIN B12: Vitamin B-12: >7500 pg/mL — ABNORMAL HIGH (ref 180–914)

## 2022-06-20 DIAGNOSIS — I5022 Chronic systolic (congestive) heart failure: Secondary | ICD-10-CM | POA: Diagnosis not present

## 2022-06-20 DIAGNOSIS — I493 Ventricular premature depolarization: Secondary | ICD-10-CM | POA: Diagnosis not present

## 2022-06-20 DIAGNOSIS — I350 Nonrheumatic aortic (valve) stenosis: Secondary | ICD-10-CM | POA: Diagnosis not present

## 2022-06-24 DIAGNOSIS — I493 Ventricular premature depolarization: Secondary | ICD-10-CM | POA: Diagnosis not present

## 2022-06-25 ENCOUNTER — Inpatient Hospital Stay (HOSPITAL_BASED_OUTPATIENT_CLINIC_OR_DEPARTMENT_OTHER): Payer: Medicare HMO | Admitting: Medical Oncology

## 2022-06-25 ENCOUNTER — Encounter: Payer: Self-pay | Admitting: Medical Oncology

## 2022-06-25 ENCOUNTER — Other Ambulatory Visit: Payer: Self-pay

## 2022-06-25 ENCOUNTER — Ambulatory Visit: Payer: Medicare HMO

## 2022-06-25 ENCOUNTER — Inpatient Hospital Stay: Payer: Medicare HMO

## 2022-06-25 ENCOUNTER — Encounter: Payer: Self-pay | Admitting: Oncology

## 2022-06-25 ENCOUNTER — Other Ambulatory Visit: Payer: Medicare HMO

## 2022-06-25 ENCOUNTER — Other Ambulatory Visit: Payer: Self-pay | Admitting: *Deleted

## 2022-06-25 DIAGNOSIS — E538 Deficiency of other specified B group vitamins: Secondary | ICD-10-CM | POA: Diagnosis not present

## 2022-06-25 DIAGNOSIS — R197 Diarrhea, unspecified: Secondary | ICD-10-CM

## 2022-06-25 DIAGNOSIS — T451X5A Adverse effect of antineoplastic and immunosuppressive drugs, initial encounter: Secondary | ICD-10-CM | POA: Diagnosis not present

## 2022-06-25 DIAGNOSIS — D500 Iron deficiency anemia secondary to blood loss (chronic): Secondary | ICD-10-CM | POA: Diagnosis not present

## 2022-06-25 DIAGNOSIS — G620 Drug-induced polyneuropathy: Secondary | ICD-10-CM | POA: Diagnosis not present

## 2022-06-25 DIAGNOSIS — R112 Nausea with vomiting, unspecified: Secondary | ICD-10-CM

## 2022-06-25 DIAGNOSIS — K30 Functional dyspepsia: Secondary | ICD-10-CM | POA: Diagnosis not present

## 2022-06-25 DIAGNOSIS — C9001 Multiple myeloma in remission: Secondary | ICD-10-CM | POA: Diagnosis not present

## 2022-06-25 DIAGNOSIS — C9000 Multiple myeloma not having achieved remission: Secondary | ICD-10-CM | POA: Diagnosis not present

## 2022-06-25 MED ORDER — DIPHENOXYLATE-ATROPINE 2.5-0.025 MG PO TABS: 2.0000 | ORAL_TABLET | Freq: Four times a day (QID) | ORAL | 3 refills | Status: DC | PRN

## 2022-06-25 MED ORDER — PROMETHAZINE HCL 25 MG RE SUPP: 25.0000 mg | Freq: Four times a day (QID) | RECTAL | 0 refills | Status: DC | PRN

## 2022-06-25 NOTE — Progress Notes (Signed)
Virtual Visit Progress Note  [name],you are scheduled for a virtual visit with your provider today.    Just as we do with appointments in the office, we must obtain your consent to participate.  Your consent will be active for this visit and any virtual visit you may have with one of our providers in the next 365 days.    If you have a MyChart account, I can also send a copy of this consent to you electronically.  All virtual visits are billed to your insurance company just like a traditional visit in the office.  As this is a virtual visit, video technology does not allow for your provider to perform a traditional examination.  This may limit your provider's ability to fully assess your condition.  If your provider identifies any concerns that need to be evaluated in person or the need to arrange testing such as labs, EKG, etc, we will make arrangements to do so.    Although advances in technology are sophisticated, we cannot ensure that it will always work on either your end or our end.  If the connection with a video visit is poor, we may have to switch to a telephone visit.  With either a video or telephone visit, we are not always able to ensure that we have a secure connection.   I need to obtain your verbal consent now.   Are you willing to proceed with your visit today?   Sarah [name] has provided verbal consent on 06/25/2022 for a virtual visit (video or telephone).   Hughie Closs, PA-C 06/25/2022  3:40 PM    I connected with Sarah [name] on 06/25/22 at 11:30 AM EST by video enabled telemedicine visit and verified that I am speaking with the correct person using two identifiers.   I discussed the limitations, risks, security and privacy concerns of performing an evaluation and management service by telemedicine and the availability of in-person appointments. I also discussed with the patient that there may be a patient responsible charge related to this service.  The patient expressed understanding and agreed to proceed.   Other persons participating in the visit and their role in the encounter: None     Patient's location: Home  Provider's location: Clinic   Chief Complaint: GI upset   Oncologic History: Multiple Myeloma - treatment with IVIG PRN - last dose 02/13/2022  Diarrhea: She reports that she has had diarrhea for the past 4 days. She is having about 12-15 episodes of watery non bloody loose stools per day.  Anytime she eats she had diarrhea. Also having nausea and some occasional non-bloody vomiting for which she has tried phenergan. Low grade fever 99.32F this morning at urgent care but otherwise no fever. She is having mild occasional epigastric abdominal pain which she feels is from being hungry from not being able to hold anything down. She has tried pedialyte. Lomotil is not helping (6-8). Not using imodium. Traditionally for her chronic diarrhea she will use both Lomotil and Imodium post treatment.. She has been trying to drink plenty of water. She reports that a close contact with COVID-19 however she has tested negative for flu/COVID-19.   Patient Care Team: Glean Hess, MD as PCP - General (Internal Medicine) Jodell Cipro, MD as Referring Physician (Pain Medicine) Jennye Boroughs, MD as Consulting Physician (Cardiology) Earlie Server, MD as Consulting Physician (Oncology)   Name of the patient: Sarah [name]  [id number]  [birth year]   Date of visit:  06/25/22  Review of systems- ROS As stated above in HPI  Allergies  Allergen Reactions   Oxycodone-Acetaminophen Anaphylaxis    Swelling and rash   Celebrex [Celecoxib] Diarrhea   Codeine    Plerixafor     In 2016 during ASCT collection patient developed fever to 103.39F and required hospitalization   Benadryl [Diphenhydramine] Palpitations   Morphine Itching and Rash   Ondansetron Diarrhea   Tylenol [Acetaminophen] Itching and Rash    Past Medical History:  Diagnosis  Date   Anemia    Anxiety    Aortic stenosis    a. 05/2020 Echo: EF 45-50%, sev AS - seen by TAVR team @ [facility] - CTA sugg of tricuspid valve w/ fusing of 2 leaflets-TAVR deferred in setting of acute infxn; b. 07/2020 Echo: EF 50-55%, mod-sev AS; c. 12/2020 Echo: EF>55%. Mod-sev paradoxical low-flow low-gradient AS; d. 08/2021 Echo: EF 35-40%, severe AS, triv AI, mild MR.   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 Variable EF over time; b. 08/2017 Echo: EF 40%; b. 03/2020 Echo: EF 55-60%; c. 05/2020 Echo: EF 45-50%; d. 07/2020 Echo: EF 50-55%; e. 12/2020 Echo: EF>55%; f. 08/2021 Echo: EF 35-40%.   Chronic heart failure with preserved ejection fraction (HFpEF) ([place])    a. Variable EF over time; b. 08/2017 Echo: EF 40%; b. 03/2020 Echo: EF 55-60%; c. 05/2020 Echo: EF 45-50%; d. 07/2020 Echo: EF 50-55%; e. 12/2020 Echo: EF>55%; f. 08/2021 Echo: EF 35-40%, no RV, mildly dil LA, mild MR, triv AI, severe AS.   CKD (chronic kidney disease) stage 3, GFR 30-59 ml/min (HCC)    Depression    Diabetes mellitus (HCC)    Dizziness    Fatty liver    Frequent falls    GERD (gastroesophageal reflux disease)    Gout    Heart murmur    History of blood transfusion    History of bone marrow transplant ([place])    History of uterine fibroid    Hx of cardiac catheterization    a. 01/2016 [facility] - after abnl nuc): Nl cors.   Hypertension    Hypomagnesemia    IDA (iron deficiency anemia)    Multiple myeloma (HCC)    Personal history of chemotherapy    PSVT (paroxysmal supraventricular tachycardia)    a. S/p [facility]   PVC's (premature ventricular contractions)    a. Well-managed w/ bisoprolol in outpt setting.   Renal cyst     Past Surgical History:  Procedure Laterality Date   ABDOMINAL HYSTERECTOMY     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   CARDIAC ELECTROPHYSIOLOGY MAPPING AND  ABLATION     CARPAL TUNNEL RELEASE Bilateral    CHOLECYSTECTOMY  2008   COLONOSCOPY WITH PROPOFOL N/A 05/07/2017   Procedure: COLONOSCOPY WITH PROPOFOL;  Surgeon: [name], MD;  Location: [facility] ENDOSCOPY;  Service: Gastroenterology;  Laterality: N/A;   COLONOSCOPY WITH PROPOFOL N/A 12/19/2020   Procedure: COLONOSCOPY WITH PROPOFOL;  Surgeon: [name], MD;  Location: [facility] ENDOSCOPY;  Service: Gastroenterology;  Laterality: N/A;   ESOPHAGOGASTRODUODENOSCOPY (EGD) WITH PROPOFOL N/A 05/07/2017   Procedure: ESOPHAGOGASTRODUODENOSCOPY (EGD) WITH PROPOFOL;  Surgeon: [name], MD;  Location: [facility] ENDOSCOPY;  Service: Gastroenterology;  Laterality: N/A;   FOOT SURGERY Bilateral    INCONTINENCE SURGERY  2009   INTERSTIM IMPLANT PLACEMENT     other  over active bladder   OTHER SURGICAL HISTORY     bladder stimulator    PARTIAL HYSTERECTOMY  03/1996   fibroids   PORTA CATH INSERTION N/A 03/10/2019   Procedure: PORTA CATH INSERTION;  Surgeon: Algernon Huxley, MD;  Location: [facility] CV LAB;  Service: Cardiovascular;  Laterality: N/A;   TONSILLECTOMY  2007    Social History   Socioeconomic History   Marital status: Widowed    Spouse name: Not on file   Number of children: 2   Years of education: Not on file   Highest education level: Not on file  Occupational History   Occupation: Disabled  Tobacco Use   Smoking status: Former    Packs/day: 1.00    Years: 20.00    Total pack years: 20.00    Types: Cigarettes    Quit date: 07/02/1991    Years since quitting: 31.0   Smokeless tobacco: Never  Vaping Use   Vaping Use: Never used  Substance and Sexual Activity   Alcohol use: Yes    Alcohol/week: 2.0 standard drinks of alcohol    Types: 2 Cans of beer per week   Drug use: No   Sexual activity: Never  Other Topics Concern   Not on file  Social History Narrative   Pt lives with her 2 sisters   Social Determinants of Health   Financial Resource Strain: High Risk (03/01/2022)    Overall Financial Resource Strain (CARDIA)    Difficulty of Paying Living Expenses: Very hard  Food Insecurity: No Food Insecurity (04/09/2022)   Hunger Vital Sign    Worried About Running Out of Food in the Last Year: Never true    Ran Out of Food in the Last Year: Never true  Transportation Needs: No Transportation Needs (04/09/2022)   PRAPARE - Hydrologist (Medical): No    Lack of Transportation (Non-Medical): No  Physical Activity: Insufficiently Active (03/01/2022)   Exercise Vital Sign    Days of Exercise per Week: 7 days    Minutes of Exercise per Session: 10 min  Stress: Stress Concern Present (02/07/2020)   [place]    Feeling of Stress : Rather much  Social Connections: Socially Isolated (03/01/2022)   Social Connection and Isolation Panel [NHANES]    Frequency of Communication with Friends and Family: More than three times a week    Frequency of Social Gatherings with Friends and Family: Twice a week    Attends Religious Services: Never    Marine scientist or Organizations: No    Attends Archivist Meetings: Never    Marital Status: Widowed  Intimate Partner Violence: Not At Risk (04/09/2022)   Humiliation, Afraid, Rape, and Kick questionnaire    Fear of Current or Ex-Partner: No    Emotionally Abused: No    Physically Abused: No    Sexually Abused: No    Immunization History  Administered Date(s) Administered   DTaP / Hep B / IPV 11/14/2020, 07/24/2021   Fluad Quad(high Dose 65+) 03/13/2021   HIB (PRP-T) 07/09/2016, 09/10/2016, 07/08/2017, 11/14/2020, 07/24/2021   Hepatitis B, adult 07/09/2016, 09/10/2016, 07/08/2017   IPV 07/09/2016, 09/10/2016, 07/08/2017   Influenza,inj,Quad PF,6+ Mos 03/23/2015, 06/18/2016, 04/08/2017, 06/30/2017, 03/31/2018, 04/12/2019, 04/03/2020   MMR 07/08/2017   PFIZER(Purple Top)SARS-COV-2 Vaccination 09/20/2019, 10/09/2019,  01/09/2021   PNEUMOCOCCAL CONJUGATE-20 07/24/2021   Pneumococcal Conjugate-13 03/23/2015, 07/09/2016, 01/07/2017, 03/11/2017, 11/14/2020   Pneumococcal Polysaccharide-23 07/08/2017   Td  08/20/2020   Tdap 07/09/2016, 09/10/2016, 02/12/2018   Zoster Recombinat (Shingrix) 11/19/2017, 02/12/2018    Family History  Problem Relation Age of Onset   Colon cancer Father    Renal Disease Father    Diabetes Mellitus II Father    Melanoma Paternal Grandmother    Breast cancer Maternal Aunt 60   Anemia Mother    Heart disease Mother    Heart failure Mother    Renal Disease Mother    Congestive Heart Failure Mother    Heart disease Maternal Uncle    Throat cancer Maternal Uncle    Lung cancer Maternal Uncle    Liver disease Maternal Uncle    Heart failure Maternal Uncle    Hearing loss Son 25       Suicide      Current Outpatient Medications:    acyclovir (ZOVIRAX) 400 MG tablet, Take 1 tablet (400 mg total) by mouth 2 (two) times daily., Disp: 180 tablet, Rfl: 1   atorvastatin (LIPITOR) 10 MG tablet, Take 0.5 tablets (5 mg total) by mouth daily., Disp: 15 tablet, Rfl: 11   bisoprolol (ZEBETA) 10 MG tablet, Take 1 tablet (10 mg total) by mouth daily., Disp: 90 tablet, Rfl: 1   calcium carbonate (OS-CAL) 1250 (500 Ca) MG chewable tablet, Chew 1 tablet by mouth daily., Disp: , Rfl:    diclofenac sodium (VOLTAREN) 1 % GEL, Apply 2 g topically 4 (four) times daily., Disp: 100 g, Rfl: 1   DULoxetine (CYMBALTA) 60 MG capsule, TAKE 1 CAPSULE(60 MG) BY MOUTH DAILY, Disp: 90 capsule, Rfl: 0   empagliflozin (JARDIANCE) 10 MG TABS tablet, Take 1 tablet by mouth daily., Disp: , Rfl:    ENTRESTO 24-26 MG, Take 1 tablet by mouth 2 (two) times daily., Disp: , Rfl:    FLUoxetine (PROZAC) 40 MG capsule, TAKE 1 CAPSULE EVERY DAY, Disp: 90 capsule, Rfl: 0   furosemide (LASIX) 40 MG tablet, Take 40 mg by mouth daily., Disp: , Rfl:    glipiZIDE (GLUCOTROL XL) 2.5 MG 24 hr tablet, Take 1 tablet (2.5 mg total)  by mouth daily with breakfast., Disp: 30 tablet, Rfl: 0   glucose blood test strip, , Disp: , Rfl:    guaiFENesin-dextromethorphan (ROBITUSSIN DM) 100-10 MG/5ML syrup, Take 5 mLs by mouth every 4 (four) hours as needed for cough., Disp: 118 mL, Rfl: 0   montelukast (SINGULAIR) 10 MG tablet, Take 1 tablet (10 mg total) by mouth See admin instructions. Take 1 tab daily for 2 days after daratumumab treatments, Disp: 30 tablet, Rfl: 1   Multiple Vitamin (MULTIVITAMIN) tablet, Take 1 tablet by mouth daily., Disp: , Rfl:    omeprazole (PRILOSEC) 40 MG capsule, TAKE 1 CAPSULE IN THE MORNING AND TAKE 1 CAPSULE AT BEDTIME, Disp: 180 capsule, Rfl: 1   promethazine (PHENERGAN) 25 MG suppository, Place 1 suppository (25 mg total) rectally every 6 (six) hours as needed for nausea or vomiting., Disp: 12 each, Rfl: 0   promethazine (PHENERGAN) 25 MG tablet, Take 1 tablet (25 mg total) by mouth every 6 (six) hours as needed for nausea or vomiting., Disp: 90 tablet, Rfl: 1   tiZANidine (ZANAFLEX) 4 MG tablet, Take 1 tablet (4 mg total) by mouth every 6 (six) hours as needed for muscle spasms., Disp: 90 tablet, Rfl: 0   traMADol (ULTRAM) 50 MG tablet, Take 50 mg by mouth 2 (two) times daily., Disp: , Rfl:    traZODone (DESYREL) 100 MG tablet, TAKE 1 TABLET AT BEDTIME, Disp: 90  tablet, Rfl: 0   vitamin E 45 MG (100 UNITS) capsule, Take by mouth daily., Disp: , Rfl:    allopurinol (ZYLOPRIM) 100 MG tablet, TAKE 1 TABLET(100 MG) BY MOUTH DAILY as needed (Patient not taking: Reported on 04/22/2022), Disp: 90 tablet, Rfl: 1   diphenoxylate-atropine (LOMOTIL) 2.5-0.025 MG tablet, Take 2 tablets by mouth 4 (four) times daily as needed for diarrhea or loose stools., Disp: 192 tablet, Rfl: 3   nitroGLYCERIN (NITROSTAT) 0.4 MG SL tablet, Place 1 tablet (0.4 mg total) under the tongue every 5 (five) minutes as needed for chest pain. (Patient not taking: Reported on 03/25/2022), Disp: 20 tablet, Rfl: 12 No current  facility-administered medications for this visit.  Facility-Administered Medications Ordered in Other Visits:    sodium chloride flush (NS) 0.9 % injection 10 mL, 10 mL, Intravenous, PRN, Earlie Server, MD, 10 mL at 04/02/21 1537  Physical exam: Exam limited due to telemedicine Physical Exam    Assessment and plan- Patient is a 66 y.o. female   Visit Diagnosis 1. Multiple myeloma not having achieved remission ([place])   2. Diarrhea, unspecified type   3. Nausea and vomiting, unspecified vomiting type     # Recurrent lambda light chain multiple myeloma s/p second autologous bone marrow transplant [05/10/2020] . Not currently on treatment. Per Dr. Collie Siad last note Daratumumab to be considered in the future should lab abnormalities progress.  2/3. Acute on chronic. Likely viral gastroenteritis. Improving some over the last 2 days.   Phenergan suppositories (will strategically take to avoid diarrhea within 1 hour) along  with imodium and lomotil PRN. (Zofran causes diarrhea) Stay hydrated with   electrolyte drink. I have asked to see her in clinic for labs +- fluids within the next 1-2  days to ensure electrolytes are stable.   Patient expressed understanding and was in agreement with this plan. She also understands that She can call clinic at any time with any questions, concerns, or complaints.   I discussed the assessment and treatment plan with the patient. The patient was provided an opportunity to ask questions and all were answered. The patient agreed with the plan and demonstrated an understanding of the instructions.   The patient was advised to call back or seek an in-person evaluation if the symptoms worsen or if the condition fails to improve as anticipated.   I spent 15 minutes face-to-face video visit time dedicated to the care of this patient on the date of this encounter to include pre-visit review of last visit, face-to-face time with the patient, and post visit ordering of  testing/documentation.   Thank you for allowing me to participate in the care of this very pleasant patient.   Nelwyn Salisbury PA-C

## 2022-06-25 NOTE — Addendum Note (Signed)
Addended by: Nelwyn Salisbury on: 06/25/2022 04:08 PM   Modules accepted: Orders

## 2022-06-26 ENCOUNTER — Ambulatory Visit: Payer: Medicare HMO

## 2022-06-26 ENCOUNTER — Encounter: Payer: Self-pay | Admitting: Hospice and Palliative Medicine

## 2022-06-26 ENCOUNTER — Encounter: Payer: Medicare HMO | Admitting: Hospice and Palliative Medicine

## 2022-06-26 ENCOUNTER — Other Ambulatory Visit: Payer: Self-pay

## 2022-06-26 ENCOUNTER — Other Ambulatory Visit: Payer: Medicare HMO

## 2022-06-26 ENCOUNTER — Inpatient Hospital Stay: Payer: Medicare HMO

## 2022-06-26 ENCOUNTER — Inpatient Hospital Stay (HOSPITAL_BASED_OUTPATIENT_CLINIC_OR_DEPARTMENT_OTHER): Payer: Medicare HMO | Admitting: Hospice and Palliative Medicine

## 2022-06-26 VITALS — BP 116/79 | HR 55 | Temp 96.3°F | Resp 18 | Ht 62.5 in | Wt 147.0 lb

## 2022-06-26 DIAGNOSIS — K30 Functional dyspepsia: Secondary | ICD-10-CM | POA: Diagnosis not present

## 2022-06-26 DIAGNOSIS — C9000 Multiple myeloma not having achieved remission: Secondary | ICD-10-CM

## 2022-06-26 DIAGNOSIS — G620 Drug-induced polyneuropathy: Secondary | ICD-10-CM | POA: Diagnosis not present

## 2022-06-26 DIAGNOSIS — E538 Deficiency of other specified B group vitamins: Secondary | ICD-10-CM | POA: Diagnosis not present

## 2022-06-26 DIAGNOSIS — R197 Diarrhea, unspecified: Secondary | ICD-10-CM | POA: Diagnosis not present

## 2022-06-26 DIAGNOSIS — T451X5A Adverse effect of antineoplastic and immunosuppressive drugs, initial encounter: Secondary | ICD-10-CM | POA: Diagnosis not present

## 2022-06-26 DIAGNOSIS — C9001 Multiple myeloma in remission: Secondary | ICD-10-CM | POA: Diagnosis not present

## 2022-06-26 DIAGNOSIS — E876 Hypokalemia: Secondary | ICD-10-CM

## 2022-06-26 DIAGNOSIS — R112 Nausea with vomiting, unspecified: Secondary | ICD-10-CM | POA: Diagnosis not present

## 2022-06-26 DIAGNOSIS — D500 Iron deficiency anemia secondary to blood loss (chronic): Secondary | ICD-10-CM | POA: Diagnosis not present

## 2022-06-26 LAB — COMPREHENSIVE METABOLIC PANEL
ALT: 52 U/L — ABNORMAL HIGH (ref 0–44)
AST: 49 U/L — ABNORMAL HIGH (ref 15–41)
Albumin: 4.5 g/dL (ref 3.5–5.0)
Alkaline Phosphatase: 109 U/L (ref 38–126)
Anion gap: 13 (ref 5–15)
BUN: 45 mg/dL — ABNORMAL HIGH (ref 8–23)
CO2: 14 mmol/L — ABNORMAL LOW (ref 22–32)
Calcium: 8.2 mg/dL — ABNORMAL LOW (ref 8.9–10.3)
Chloride: 105 mmol/L (ref 98–111)
Creatinine, Ser: 1.96 mg/dL — ABNORMAL HIGH (ref 0.44–1.00)
GFR, Estimated: 28 mL/min — ABNORMAL LOW (ref >60)
Glucose, Bld: 161 mg/dL — ABNORMAL HIGH (ref 70–99)
Potassium: 2.6 mmol/L — CL (ref 3.5–5.1)
Sodium: 132 mmol/L — ABNORMAL LOW (ref 135–145)
Total Bilirubin: 0.7 mg/dL (ref 0.3–1.2)
Total Protein: 7.9 g/dL (ref 6.5–8.1)

## 2022-06-26 LAB — MULTIPLE MYELOMA PANEL, SERUM
Albumin SerPl Elph-Mcnc: 4 g/dL (ref 2.9–4.4)
Albumin/Glob SerPl: 1.5 (ref 0.7–1.7)
Alpha 1: 0.3 g/dL (ref 0.0–0.4)
Alpha2 Glob SerPl Elph-Mcnc: 0.9 g/dL (ref 0.4–1.0)
B-Globulin SerPl Elph-Mcnc: 0.8 g/dL (ref 0.7–1.3)
Gamma Glob SerPl Elph-Mcnc: 0.8 g/dL (ref 0.4–1.8)
Globulin, Total: 2.8 g/dL (ref 2.2–3.9)
IgA: 110 mg/dL (ref 87–352)
IgG (Immunoglobin G), Serum: 665 mg/dL (ref 586–1602)
IgM (Immunoglobulin M), Srm: 41 mg/dL (ref 26–217)
Total Protein ELP: 6.8 g/dL (ref 6.0–8.5)

## 2022-06-26 LAB — CBC WITH DIFFERENTIAL/PLATELET
Abs Immature Granulocytes: 0.02 10*3/uL (ref 0.00–0.07)
Basophils Absolute: 0 10*3/uL (ref 0.0–0.1)
Basophils Relative: 0 %
Eosinophils Absolute: 0 10*3/uL (ref 0.0–0.5)
Eosinophils Relative: 0 %
HCT: 40.7 % (ref 36.0–46.0)
Hemoglobin: 14.4 g/dL (ref 12.0–15.0)
Immature Granulocytes: 0 %
Lymphocytes Relative: 13 %
Lymphs Abs: 1.1 10*3/uL (ref 0.7–4.0)
MCH: 28.6 pg (ref 26.0–34.0)
MCHC: 35.4 g/dL (ref 30.0–36.0)
MCV: 80.8 fL (ref 80.0–100.0)
Monocytes Absolute: 0.7 10*3/uL (ref 0.1–1.0)
Monocytes Relative: 8 %
Neutro Abs: 6.9 10*3/uL (ref 1.7–7.7)
Neutrophils Relative %: 79 %
Platelets: 121 10*3/uL — ABNORMAL LOW (ref 150–400)
RBC: 5.04 MIL/uL (ref 3.87–5.11)
RDW: 15.9 % — ABNORMAL HIGH (ref 11.5–15.5)
WBC: 8.7 10*3/uL (ref 4.0–10.5)
nRBC: 0 % (ref 0.0–0.2)

## 2022-06-26 LAB — C DIFFICILE QUICK SCREEN W PCR REFLEX
C Diff antigen: NEGATIVE
C Diff interpretation: NOT DETECTED
C Diff toxin: NEGATIVE

## 2022-06-26 LAB — MAGNESIUM: Magnesium: 1.4 mg/dL — ABNORMAL LOW (ref 1.7–2.4)

## 2022-06-26 MED ORDER — SODIUM CHLORIDE 0.9 % IV SOLN
25.0000 mg | Freq: Once | INTRAVENOUS | Status: AC
  Administered 2022-06-26: 25 mg via INTRAVENOUS
  Filled 2022-06-26: qty 1

## 2022-06-26 MED ORDER — SODIUM CHLORIDE 0.9 % IV SOLN
Freq: Once | INTRAVENOUS | Status: AC
  Filled 2022-06-26: qty 250

## 2022-06-26 MED ORDER — MAGNESIUM CHLORIDE 64 MG PO TBEC: 1.0000 | DELAYED_RELEASE_TABLET | Freq: Every day | ORAL | 1 refills | Status: DC

## 2022-06-26 MED ORDER — POTASSIUM CHLORIDE 20 MEQ/100ML IV SOLN
20.0000 meq | Freq: Once | INTRAVENOUS | Status: AC
  Administered 2022-06-26: 20 meq via INTRAVENOUS

## 2022-06-26 MED ORDER — PROCHLORPERAZINE 25 MG RE SUPP: 25.0000 mg | Freq: Two times a day (BID) | RECTAL | 0 refills | Status: DC | PRN

## 2022-06-26 MED ORDER — SODIUM CHLORIDE 0.9 % IV SOLN
4.0000 g | Freq: Once | INTRAVENOUS | Status: DC
  Filled 2022-06-26: qty 8

## 2022-06-26 MED ORDER — SODIUM CHLORIDE 0.9% FLUSH
10.0000 mL | Freq: Once | INTRAVENOUS | Status: AC
  Administered 2022-06-26: 10 mL via INTRAVENOUS
  Filled 2022-06-26: qty 10

## 2022-06-26 MED ORDER — HEPARIN SOD (PORK) LOCK FLUSH 100 UNIT/ML IV SOLN
500.0000 [IU] | Freq: Once | INTRAVENOUS | Status: AC
  Administered 2022-06-26: 500 [IU] via INTRAVENOUS
  Filled 2022-06-26: qty 5

## 2022-06-26 MED ORDER — MAGNESIUM SULFATE 4 GM/100ML IV SOLN
4.0000 g | Freq: Once | INTRAVENOUS | Status: AC
  Administered 2022-06-26: 4 g via INTRAVENOUS
  Filled 2022-06-26: qty 100

## 2022-06-26 MED ORDER — POTASSIUM CHLORIDE CRYS ER 20 MEQ PO TBCR: 20.0000 meq | EXTENDED_RELEASE_TABLET | Freq: Two times a day (BID) | ORAL | 0 refills | Status: DC

## 2022-06-26 NOTE — Progress Notes (Signed)
Blood collected from port a cath

## 2022-06-26 NOTE — Progress Notes (Addendum)
Symptom Management [place]  [phone] [phone]) [phone]  Patient Care Team: Glean Hess, MD as PCP - General (Internal Medicine) Jodell Cipro, MD as Referring Physician (Pain Medicine) Jennye Boroughs, MD as Consulting Physician (Cardiology) Earlie Server, MD as Consulting Physician (Oncology)   Name of the patient: Sarah [name]  [id number]  Nov 07, 1955   Date of visit: 06/26/22  Reason for Consult:  Kaesha Kirsch is a 66 year old woman with multiple medical problems including multiple myeloma status post bone marrow transplant.   Dara on hold due to frequent infections previously hospitalized in February 2023 for pneumonia complicated by non-STEMI.  [facility] recommended holding myeloma treatments with plan for as needed IVIG.  Patient has had chronic hypomagnesemia requiring weekly magnesium infusion.  Interval History: Patient was seen virtually yesterday by Nelwyn Salisbury, PA with complaint of diarrhea unrelieved with Lomotil.  Patient was also seen in urgent care for same.  She reportedly had negative COVID test.  Patient returns to clinic today for follow-up on symptoms/labs.  Patient reports that she has had nausea and diarrhea for 4 to 5 days.  She has several episodes of vomiting 3 days ago but none since.  However, she is continue to have profuse watery diarrhea with multiple episodes daily.  She denies fever or chills.  No abdominal distention or pain.  No urinary symptoms.  No known sick contacts.  Denies any neurologic complaints.  Denies any easy bleeding or bruising.  Denies chest pain. Patient offers no further specific complaints today.  PAST MEDICAL HISTORY: Past Medical History:  Diagnosis Date   Anemia    Anxiety    Aortic stenosis    a. 05/2020 Echo: EF 45-50%, sev AS - seen by TAVR team @ [facility] - CTA sugg of tricuspid valve w/ fusing of 2 leaflets-TAVR deferred in setting of acute infxn; b. 07/2020  Echo: EF 50-55%, mod-sev AS; c. 12/2020 Echo: EF>55%. Mod-sev paradoxical low-flow low-gradient AS; d. 08/2021 Echo: EF 35-40%, severe AS, triv AI, mild MR.   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 Variable EF over time; b. 08/2017 Echo: EF 40%; b. 03/2020 Echo: EF 55-60%; c. 05/2020 Echo: EF 45-50%; d. 07/2020 Echo: EF 50-55%; e. 12/2020 Echo: EF>55%; f. 08/2021 Echo: EF 35-40%.   Chronic heart failure with preserved ejection fraction (HFpEF) ([place])    a. Variable EF over time; b. 08/2017 Echo: EF 40%; b. 03/2020 Echo: EF 55-60%; c. 05/2020 Echo: EF 45-50%; d. 07/2020 Echo: EF 50-55%; e. 12/2020 Echo: EF>55%; f. 08/2021 Echo: EF 35-40%, no RV, mildly dil LA, mild MR, triv AI, severe AS.   CKD (chronic kidney disease) stage 3, GFR 30-59 ml/min (HCC)    Depression    Diabetes mellitus (HCC)    Dizziness    Fatty liver    Frequent falls    GERD (gastroesophageal reflux disease)    Gout    Heart murmur    History of blood transfusion    History of bone marrow transplant ([place])    History of uterine fibroid    Hx of cardiac catheterization    a. 01/2016 [facility] - after abnl nuc): Nl cors.   Hypertension    Hypomagnesemia    IDA (iron deficiency anemia)    Multiple myeloma (HCC)    Personal history of chemotherapy    PSVT (  paroxysmal supraventricular tachycardia)    a. S/p [facility]   PVC's (premature ventricular contractions)    a. Well-managed w/ bisoprolol in outpt setting.   Renal cyst     PAST SURGICAL HISTORY:  Past Surgical History:  Procedure Laterality Date   ABDOMINAL HYSTERECTOMY     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   CARDIAC ELECTROPHYSIOLOGY MAPPING AND ABLATION     CARPAL TUNNEL RELEASE Bilateral    CHOLECYSTECTOMY  2008   COLONOSCOPY WITH PROPOFOL N/A 05/07/2017   Procedure: COLONOSCOPY WITH PROPOFOL;  Surgeon: [name], MD;   Location: [facility] ENDOSCOPY;  Service: Gastroenterology;  Laterality: N/A;   COLONOSCOPY WITH PROPOFOL N/A 12/19/2020   Procedure: COLONOSCOPY WITH PROPOFOL;  Surgeon: [name], MD;  Location: [facility] ENDOSCOPY;  Service: Gastroenterology;  Laterality: N/A;   ESOPHAGOGASTRODUODENOSCOPY (EGD) WITH PROPOFOL N/A 05/07/2017   Procedure: ESOPHAGOGASTRODUODENOSCOPY (EGD) WITH PROPOFOL;  Surgeon: [name], MD;  Location: [facility] ENDOSCOPY;  Service: Gastroenterology;  Laterality: N/A;   FOOT SURGERY Bilateral    INCONTINENCE SURGERY  2009   INTERSTIM IMPLANT PLACEMENT     other     over active bladder   OTHER SURGICAL HISTORY     bladder stimulator    PARTIAL HYSTERECTOMY  03/1996   fibroids   PORTA CATH INSERTION N/A 03/10/2019   Procedure: PORTA CATH INSERTION;  Surgeon: Algernon Huxley, MD;  Location: [place] CV LAB;  Service: Cardiovascular;  Laterality: N/A;   TONSILLECTOMY  2007    HEMATOLOGY/ONCOLOGY HISTORY:  Oncology History Overview Note  # June 2017- IgALamda MULTIPLE MYELOMA [BMBx- 80% plasma cells; [facility]]; Lamda light chain 1340; s/p RVD   # 14th DEC 2016- Auto-Stem cell transplant [[facility];Dr.Hertzig]; rev [dose reduced sec to [place]  # Maintenance Revlimid- 50m 3 w-ON & 1 week OFF; HELD July 2018- sec to ONJ  # Bone lesions-numerous ~518mlytic lesions in Thoracic spine- on Zometa  # 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  # "Colitis" s/p multiple EGD/Colonoscopies; EGD- patchy inflammation/colo- Nov 2018 [Dr.Anna]  DIAGNOSIS: Multiple myeloma  GOALS: Control/palliative  CURRENT/MOST RECENT THERAPY -surveillance    Multiple myeloma not having achieved remission ([facility] 09/27/2019 - 04/10/2020 Chemotherapy         08/31/2020 - 05/14/2021 Chemotherapy   Patient is on Treatment  Plan : MYELOMA MAINTENANCE Bortezomib SQ q14d     05/30/2021 - 05/30/2021 Chemotherapy   Patient is on Treatment Plan : MYELOMA NEWLY DIAGNOSED TRANSPLANT CANDIDATE Daratumumab SQ + Lenalidomide q28d (Maintenance)     05/30/2021 -  Chemotherapy   Patient is on Treatment Plan : MYELOMA NEWLY DIAGNOSED TRANSPLANT CANDIDATE Daratumumab SQ + Lenalidomide q28d (Maintenance)     06/06/2021 - 10/29/2021 Chemotherapy   Patient is on Treatment Plan : MYELOMA POST  TRANSPLANT Daratumumab SQ (Maintenance)     Multiple myeloma in remission ([facility] 05/14/2021 Initial Diagnosis   Multiple myeloma in remission ([facility]  05/30/2021 - 05/30/2021 Chemotherapy   Patient is on Treatment Plan : MYELOMA NEWLY DIAGNOSED TRANSPLANT CANDIDATE Daratumumab SQ + Lenalidomide q28d (Maintenance)     05/30/2021 -  Chemotherapy   Patient is on Treatment Plan : MYELOMA NEWLY DIAGNOSED TRANSPLANT CANDIDATE Daratumumab SQ + Lenalidomide q28d (Maintenance)     06/06/2021 - 10/29/2021 Chemotherapy   Patient is on Treatment Plan : MYELOMA  POST  TRANSPLANT Daratumumab SQ (Maintenance)       ALLERGIES:  is allergic to oxycodone-acetaminophen, celebrex [celecoxib], codeine, plerixafor, benadryl [diphenhydramine], morphine, ondansetron, and tylenol [acetaminophen].  MEDICATIONS:  Current Outpatient Medications  Medication Sig Dispense Refill   acyclovir (ZOVIRAX) 400 MG tablet Take 1 tablet (400 mg total) by mouth 2 (two) times daily. 180 tablet 1   atorvastatin (LIPITOR) 10 MG tablet Take 0.5 tablets (5 mg total) by mouth daily. 15 tablet 11   bisoprolol (ZEBETA) 10 MG tablet Take 1 tablet (10 mg total) by mouth daily. 90 tablet 1   calcium carbonate (OS-CAL) 1250 (500 Ca) MG chewable tablet Chew 1 tablet by mouth daily.     diclofenac sodium (VOLTAREN) 1 % GEL Apply 2 g topically 4 (four) times daily. 100 g 1   diphenoxylate-atropine (LOMOTIL) 2.5-0.025 MG tablet Take 2 tablets by mouth 4 (four) times daily as needed for  diarrhea or loose stools. 192 tablet 3   DULoxetine (CYMBALTA) 60 MG capsule TAKE 1 CAPSULE(60 MG) BY MOUTH DAILY 90 capsule 0   empagliflozin (JARDIANCE) 10 MG TABS tablet Take 1 tablet by mouth daily.     ENTRESTO 24-26 MG Take 1 tablet by mouth 2 (two) times daily.     FLUoxetine (PROZAC) 40 MG capsule TAKE 1 CAPSULE EVERY DAY 90 capsule 0   furosemide (LASIX) 40 MG tablet Take 40 mg by mouth daily.     glipiZIDE (GLUCOTROL XL) 2.5 MG 24 hr tablet Take 1 tablet (2.5 mg total) by mouth daily with breakfast. 30 tablet 0   glucose blood test strip      guaiFENesin-dextromethorphan (ROBITUSSIN DM) 100-10 MG/5ML syrup Take 5 mLs by mouth every 4 (four) hours as needed for cough. 118 mL 0   montelukast (SINGULAIR) 10 MG tablet Take 1 tablet (10 mg total) by mouth See admin instructions. Take 1 tab daily for 2 days after daratumumab treatments 30 tablet 1   Multiple Vitamin (MULTIVITAMIN) tablet Take 1 tablet by mouth daily.     omeprazole (PRILOSEC) 40 MG capsule TAKE 1 CAPSULE IN THE MORNING AND TAKE 1 CAPSULE AT BEDTIME 180 capsule 1   promethazine (PHENERGAN) 25 MG tablet Take 1 tablet (25 mg total) by mouth every 6 (six) hours as needed for nausea or vomiting. 90 tablet 1   tiZANidine (ZANAFLEX) 4 MG tablet Take 1 tablet (4 mg total) by mouth every 6 (six) hours as needed for muscle spasms. 90 tablet 0   traMADol (ULTRAM) 50 MG tablet Take 50 mg by mouth 2 (two) times daily.     traZODone (DESYREL) 100 MG tablet TAKE 1 TABLET AT BEDTIME 90 tablet 0   vitamin E 45 MG (100 UNITS) capsule Take by mouth daily.     allopurinol (ZYLOPRIM) 100 MG tablet TAKE 1 TABLET(100 MG) BY MOUTH DAILY as needed (Patient not taking: Reported on 04/22/2022) 90 tablet 1   nitroGLYCERIN (NITROSTAT) 0.4 MG SL tablet Place 1 tablet (0.4 mg total) under the tongue every 5 (five) minutes as needed for chest pain. (Patient not taking: Reported on 03/25/2022) 20 tablet 12   promethazine (PHENERGAN) 25 MG suppository Place 1  suppository (25 mg total) rectally every 6 (six) hours as needed for nausea or vomiting. (Patient not taking: Reported on 06/26/2022) 12 each 0   No current facility-administered medications for this visit.   Facility-Administered Medications Ordered in Other Visits  Medication Dose Route Frequency Provider Last Rate Last Admin   heparin lock flush 100 unit/mL  500 Units Intravenous Once Earlie Server, MD       magnesium sulfate IVPB 4 g 100 mL  4 g Intravenous Once Borders, Kirt Boys, NP 50 mL/hr at 06/26/22 1022 4 g at 06/26/22 1022   potassium chloride 20 mEq in 100 mL IVPB  20 mEq Intravenous Once Borders, Kirt Boys, NP 100 mL/hr at 06/26/22 1012 20 mEq at 06/26/22 1012   potassium chloride 20 mEq in 100 mL IVPB  20 mEq Intravenous Once Borders, Kirt Boys, NP       sodium chloride flush (NS) 0.9 % injection 10 mL  10 mL Intravenous PRN Earlie Server, MD   10 mL at 04/02/21 0904    VITAL SIGNS: BP 116/79   Pulse (!) 55   Temp (!) 96.3 F (35.7 C) (Tympanic)   Resp 18   Ht 5' 2.5" (1.588 m)   Wt 147 lb (66.7 kg)   SpO2 99% Comment: room air  BMI 26.46 kg/m  Filed Weights   06/26/22 0917  Weight: 147 lb (66.7 kg)    Estimated body mass index is 26.46 kg/m as calculated from the following:   Height as of this encounter: 5' 2.5" (1.588 m).   Weight as of this encounter: 147 lb (66.7 kg).  LABS: CBC:    Component Value Date/Time   WBC 8.7 06/26/2022 0901   HGB 14.4 06/26/2022 0901   HCT 40.7 06/26/2022 0901   PLT 121 (L) 06/26/2022 0901   MCV 80.8 06/26/2022 0901   NEUTROABS 6.9 06/26/2022 0901   LYMPHSABS 1.1 06/26/2022 0901   MONOABS 0.7 06/26/2022 0901   EOSABS 0.0 06/26/2022 0901   BASOSABS 0.0 06/26/2022 0901   Comprehensive Metabolic Panel:    Component Value Date/Time   NA 132 (L) 06/26/2022 0901   NA 141 04/19/2016 1604   K 2.6 (LL) 06/26/2022 0901   CL 105 06/26/2022 0901   CO2 14 (L) 06/26/2022 0901   BUN 45 (H) 06/26/2022 0901   BUN 22 04/19/2016 1604    CREATININE 1.96 (H) 06/26/2022 0901   GLUCOSE 161 (H) 06/26/2022 0901   CALCIUM 8.2 (L) 06/26/2022 0901   CALCIUM 8.8 03/23/2020 1144   AST 49 (H) 06/26/2022 0901   ALT 52 (H) 06/26/2022 0901   ALKPHOS 109 06/26/2022 0901   BILITOT 0.7 06/26/2022 0901   PROT 7.9 06/26/2022 0901   ALBUMIN 4.5 06/26/2022 0901    RADIOGRAPHIC STUDIES: No results found.  PERFORMANCE STATUS (ECOG) : 1 - Symptomatic but completely ambulatory  Review of Systems Unless otherwise noted, a complete review of systems is negative.  Physical Exam General: NAD Cardiovascular: regular rate and rhythm Pulmonary: clear anterior/posterior fields Abdomen: soft, nontender, + bowel sounds GU: no suprapubic tenderness Extremities: no edema, no joint deformities Skin: no rashes Neurological: Nonfocal  Assessment and Plan:  Diarrhea  -suspect viral gastroenteritis.  However, will send stool studies to check for other infectious etiologies. Discussed with Dr. Tasia Catchings.  Continue antidiarrheals/antiemetics.  Plan to bring patient back tomorrow for repeat labs/fluids.  However, ED triggers discussed at length with patient and would have a low threshold for hospitalization if needed.  Hypokalemia/hypomagnesemia/hypocalcemia -secondary to GI loss from diarrhea. Will proceed with IV fluids and correcting electrolytes. Restart slow mag and start oral KCL supplementation. Increase calcium supplementation.   Case and plan discussed with Dr. Tasia Catchings.  Report called to ED triage nurse  Patient expressed understanding and was in agreement with this plan. She also understands that She can call clinic at any  time with any questions, concerns, or complaints.   Thank you for allowing me to participate in the care of this very pleasant patient.   Time Total: 20 minutes  Visit consisted of counseling and education dealing with the complex and emotionally intense issues of symptom management and palliative care in the setting of serious and  potentially life-threatening illness.Greater than 50%  of this time was spent counseling and coordinating care related to the above assessment and plan.  Signed by: Altha Harm, PhD, NP-C

## 2022-06-26 NOTE — Addendum Note (Signed)
Addended by: Altha Harm R on: 06/26/2022 10:54 AM   Modules accepted: Orders

## 2022-06-27 ENCOUNTER — Inpatient Hospital Stay: Payer: Medicare HMO

## 2022-06-27 VITALS — BP 98/63 | HR 71 | Temp 96.9°F | Resp 20

## 2022-06-27 DIAGNOSIS — T451X5A Adverse effect of antineoplastic and immunosuppressive drugs, initial encounter: Secondary | ICD-10-CM | POA: Diagnosis not present

## 2022-06-27 DIAGNOSIS — D500 Iron deficiency anemia secondary to blood loss (chronic): Secondary | ICD-10-CM | POA: Diagnosis not present

## 2022-06-27 DIAGNOSIS — K30 Functional dyspepsia: Secondary | ICD-10-CM | POA: Diagnosis not present

## 2022-06-27 DIAGNOSIS — C9000 Multiple myeloma not having achieved remission: Secondary | ICD-10-CM

## 2022-06-27 DIAGNOSIS — G620 Drug-induced polyneuropathy: Secondary | ICD-10-CM | POA: Diagnosis not present

## 2022-06-27 DIAGNOSIS — E538 Deficiency of other specified B group vitamins: Secondary | ICD-10-CM | POA: Diagnosis not present

## 2022-06-27 DIAGNOSIS — R197 Diarrhea, unspecified: Secondary | ICD-10-CM | POA: Diagnosis not present

## 2022-06-27 DIAGNOSIS — R112 Nausea with vomiting, unspecified: Secondary | ICD-10-CM | POA: Diagnosis not present

## 2022-06-27 DIAGNOSIS — C9001 Multiple myeloma in remission: Secondary | ICD-10-CM | POA: Diagnosis not present

## 2022-06-27 DIAGNOSIS — E876 Hypokalemia: Secondary | ICD-10-CM

## 2022-06-27 LAB — GI PATHOGEN PANEL BY PCR, STOOL
Adenovirus F 40/41: NOT DETECTED
Astrovirus: NOT DETECTED
Campylobacter by PCR: NOT DETECTED
Cryptosporidium by PCR: NOT DETECTED
Cyclospora cayetanensis: NOT DETECTED
E coli (ETEC) LT/ST: DETECTED — AB
E coli (STEC): NOT DETECTED
Entamoeba histolytica: NOT DETECTED
Enteroaggregative E coli: NOT DETECTED
Enteropathogenic E coli: NOT DETECTED
G lamblia by PCR: NOT DETECTED
Norovirus GI/GII: NOT DETECTED
Plesiomonas shigelloides: NOT DETECTED
Rotavirus A by PCR: DETECTED — AB
Salmonella by PCR: NOT DETECTED
Sapovirus: NOT DETECTED
Shigella by PCR: NOT DETECTED
Vibrio cholerae: NOT DETECTED
Vibrio: NOT DETECTED
Yersinia enterocolitica: NOT DETECTED

## 2022-06-27 LAB — COMPREHENSIVE METABOLIC PANEL
ALT: 42 U/L (ref 0–44)
AST: 34 U/L (ref 15–41)
Albumin: 4.2 g/dL (ref 3.5–5.0)
Alkaline Phosphatase: 97 U/L (ref 38–126)
Anion gap: 9 (ref 5–15)
BUN: 49 mg/dL — ABNORMAL HIGH (ref 8–23)
CO2: 14 mmol/L — ABNORMAL LOW (ref 22–32)
Calcium: 8.1 mg/dL — ABNORMAL LOW (ref 8.9–10.3)
Chloride: 111 mmol/L (ref 98–111)
Creatinine, Ser: 1.69 mg/dL — ABNORMAL HIGH (ref 0.44–1.00)
GFR, Estimated: 33 mL/min — ABNORMAL LOW (ref >60)
Glucose, Bld: 138 mg/dL — ABNORMAL HIGH (ref 70–99)
Potassium: 3.4 mmol/L — ABNORMAL LOW (ref 3.5–5.1)
Sodium: 134 mmol/L — ABNORMAL LOW (ref 135–145)
Total Bilirubin: 0.5 mg/dL (ref 0.3–1.2)
Total Protein: 7.1 g/dL (ref 6.5–8.1)

## 2022-06-27 LAB — MAGNESIUM: Magnesium: 2.2 mg/dL (ref 1.7–2.4)

## 2022-06-27 MED ORDER — SODIUM CHLORIDE 0.9% FLUSH
10.0000 mL | Freq: Once | INTRAVENOUS | Status: AC
  Administered 2022-06-27: 10 mL via INTRAVENOUS
  Filled 2022-06-27: qty 10

## 2022-06-27 MED ORDER — POTASSIUM CHLORIDE 20 MEQ/100ML IV SOLN
20.0000 meq | Freq: Once | INTRAVENOUS | Status: AC
  Administered 2022-06-27: 20 meq via INTRAVENOUS

## 2022-06-27 MED ORDER — SODIUM CHLORIDE 0.9 % IV SOLN
Freq: Once | INTRAVENOUS | Status: AC
  Filled 2022-06-27: qty 250

## 2022-06-27 MED ORDER — SODIUM CHLORIDE 0.9 % IV SOLN
1.0000 g | Freq: Once | INTRAVENOUS | Status: AC
  Administered 2022-06-27: 1 g via INTRAVENOUS
  Filled 2022-06-27: qty 10

## 2022-06-27 MED ORDER — HEPARIN SOD (PORK) LOCK FLUSH 100 UNIT/ML IV SOLN
500.0000 [IU] | Freq: Once | INTRAVENOUS | Status: AC
  Administered 2022-06-27: 500 [IU] via INTRAVENOUS
  Filled 2022-06-27: qty 5

## 2022-06-27 MED ORDER — SODIUM CHLORIDE 0.9 % IV SOLN
25.0000 mg | Freq: Once | INTRAVENOUS | Status: AC
  Administered 2022-06-27: 25 mg via INTRAVENOUS
  Filled 2022-06-27: qty 1

## 2022-06-30 LAB — STOOL CULTURE: E coli, Shiga toxin Assay: NEGATIVE

## 2022-06-30 LAB — STOOL CULTURE REFLEX - CMPCXR

## 2022-06-30 LAB — STOOL CULTURE REFLEX - RSASHR

## 2022-07-01 ENCOUNTER — Ambulatory Visit
Admission: RE | Admit: 2022-07-01 | Discharge: 2022-07-01 | Disposition: A | Discharge status: Home / Self Care | Payer: Medicare PPO | Source: Ambulatory Visit | Attending: Physician Assistant | Admitting: Physician Assistant

## 2022-07-01 ENCOUNTER — Encounter: Payer: Self-pay | Admitting: Internal Medicine

## 2022-07-01 ENCOUNTER — Encounter: Payer: Self-pay | Admitting: Oncology

## 2022-07-01 VITALS — BP 110/69 | HR 66 | Temp 98.9°F | Resp 18

## 2022-07-01 DIAGNOSIS — R051 Acute cough: Secondary | ICD-10-CM | POA: Diagnosis not present

## 2022-07-01 DIAGNOSIS — J029 Acute pharyngitis, unspecified: Secondary | ICD-10-CM | POA: Diagnosis not present

## 2022-07-01 DIAGNOSIS — Z1152 Encounter for screening for COVID-19: Secondary | ICD-10-CM | POA: Insufficient documentation

## 2022-07-01 DIAGNOSIS — J069 Acute upper respiratory infection, unspecified: Secondary | ICD-10-CM | POA: Diagnosis not present

## 2022-07-01 LAB — RESP PANEL BY RT-PCR (RSV, FLU A&B, COVID)  RVPGX2
Influenza A by PCR: NEGATIVE
Influenza B by PCR: NEGATIVE
Resp Syncytial Virus by PCR: NEGATIVE
SARS Coronavirus 2 by RT PCR: NEGATIVE

## 2022-07-01 LAB — GROUP A STREP BY PCR: Group A Strep by PCR: NOT DETECTED

## 2022-07-01 MED ORDER — PSEUDOEPH-BROMPHEN-DM 30-2-10 MG/5ML PO SYRP: 10.0000 mL | ORAL_SOLUTION | Freq: Four times a day (QID) | ORAL | 0 refills | Status: AC | PRN

## 2022-07-01 MED ORDER — LIDOCAINE VISCOUS HCL 2 % MT SOLN: 15.0000 mL | OROMUCOSAL | 0 refills | Status: AC | PRN

## 2022-07-01 NOTE — Discharge Instructions (Addendum)
-  I will contact you on MyChart with the results of your test.  If you have strep I will send antibiotics. - You likely have a viral illness of the strep is negative.  Plenty rest and fluids.  I sent cough medicine and viscous lidocaine in the pharmacy.  He can also use cough drops and Chloraseptic spray. - Need to be seen again if you have a fever, any extreme weakness or breathing difficulty.

## 2022-07-01 NOTE — ED Provider Notes (Signed)
[facility]    CSN: [id number] Arrival date & time: 07/01/22  1334      History   Chief Complaint Chief Complaint  Patient presents with   Cough    Entered by patient   Sore Throat    HPI Sarah [name] is a 67 y.o. female presenting for 5-day history of cough, congestion, sore throat and voice hoarseness as well as fatigue.  Patient denies any fever, sinus pain, ear pain, breathing difficulty, vomiting or diarrhea.  No known sick contacts.  She would like to be tested for flu and COVID.  She reports that she receives magnesium infusions once a week and is supposed to have an infusion tomorrow and wanted to be checked before going to this appointment.  She has taken over-the-counter cough medications without relief.  No other complaints.  HPI  Past Medical History:  Diagnosis Date   Anemia    Anxiety    Aortic stenosis    a. 05/2020 Echo: EF 45-50%, sev AS - seen by TAVR team @ [facility] - CTA sugg of tricuspid valve w/ fusing of 2 leaflets-TAVR deferred in setting of acute infxn; b. 07/2020 Echo: EF 50-55%, mod-sev AS; c. 12/2020 Echo: EF>55%. Mod-sev paradoxical low-flow low-gradient AS; d. 08/2021 Echo: EF 35-40%, severe AS, triv AI, mild MR.   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 Variable EF over time; b. 08/2017 Echo: EF 40%; b. 03/2020 Echo: EF 55-60%; c. 05/2020 Echo: EF 45-50%; d. 07/2020 Echo: EF 50-55%; e. 12/2020 Echo: EF>55%; f. 08/2021 Echo: EF 35-40%.   Chronic heart failure with preserved ejection fraction (HFpEF) ([place])    a. Variable EF over time; b. 08/2017 Echo: EF 40%; b. 03/2020 Echo: EF 55-60%; c. 05/2020 Echo: EF 45-50%; d. 07/2020 Echo: EF 50-55%; e. 12/2020 Echo: EF>55%; f. 08/2021 Echo: EF 35-40%, no RV, mildly dil LA, mild MR, triv AI, severe AS.   CKD (chronic kidney disease) stage 3, GFR 30-59 ml/min (HCC)    Depression    Diabetes  mellitus (HCC)    Dizziness    Fatty liver    Frequent falls    GERD (gastroesophageal reflux disease)    Gout    Heart murmur    History of blood transfusion    History of bone marrow transplant ([place])    History of uterine fibroid    Hx of cardiac catheterization    a. 01/2016 Cath [facility] - after abnl nuc): Nl cors.   Hypertension    Hypomagnesemia    IDA (iron deficiency anemia)    Multiple myeloma (HCC)    Personal history of chemotherapy    PSVT (paroxysmal supraventricular tachycardia)    a. S/p [facility]).   PVC's (premature ventricular contractions)    a. Well-managed w/ bisoprolol in outpt setting.   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   Overweight (BMI 25.0-29.9) 02/04/2022   Bacterial URI 01/07/2022   Complete left bundle branch block 09/06/2021   Anemia of chronic renal failure, stage 3a ([place]) 09/06/2021   Gastroesophageal reflux disease without esophagitis 09/06/2021   Cardiomyopathy ([place])    Chronic systolic CHF (congestive heart failure) ([place]) 08/22/2021  NSTEMI (non-ST elevated myocardial infarction) ([place]) 08/21/2021   Low immunoglobulin level    Bronchitis    Fever    Numbness and tingling 07/23/2021   Acute gingivitis 06/24/2021   URI (upper respiratory infection) 06/23/2021   Chronic diarrhea 06/23/2021   Multiple myeloma in remission ([place]) 05/14/2021   Leg swelling 04/02/2021   Neuropathy 04/02/2021   Fever in adult 03/05/2021   Frequent PVCs 12/04/2020   Diarrhea 10/25/2020   Encounter for antineoplastic chemotherapy 08/31/2020   Coronary artery disease involving native coronary artery of native heart 04/26/2020   Yeast vaginitis 03/18/2020   Hypokalemia 03/11/2020   Dyspnea 03/02/2020   Physical deconditioning 02/07/2020   Impaired nutrition  02/07/2020   Encounter for antineoplastic immunotherapy 10/03/2019   Hypocalcemia 08/28/2019   Chronic nausea 12/17/2018   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[place]) 12/17/2018   Hyperlipidemia associated with type 2 diabetes mellitus ([place]) 11/25/2018   Anemia 10/21/2018   Hypomagnesemia 08/20/2018   Goals of care, counseling/discussion 08/20/2018   B12 deficiency 08/20/2018   Thrombocytopenia ([place]) 02/09/2018   Benign essential HTN 08/21/2017   Carotid artery stenosis, asymptomatic, bilateral 08/06/2017   Thyroid nodule 08/06/2017   Iron deficiency anemia due to chronic blood loss 05/13/2017   Spinal stenosis of lumbar region with neurogenic claudication 04/09/2017   Long term prescription opiate use 09/07/2016   Chronic pain syndrome 07/08/2016   Pain medication agreement signed 07/08/2016   Spondylosis of lumbar region without myelopathy or radiculopathy 07/08/2016   Nonrheumatic aortic valve stenosis 06/05/2016   Environmental and seasonal allergies 04/19/2016   Insomnia 04/19/2016   Asthma 04/19/2016   Aortic regurgitation 04/19/2016   Type II diabetes mellitus with complication ([place]) 16/04/9603   Depression, major, single episode, in partial remission ([place]) 04/12/2016   Irritable bowel syndrome (IBS) 04/12/2016   Hepatic steatosis 04/12/2016   Multiple myeloma not having achieved remission ([place]) 04/02/2016   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[place]) 07/06/2015   Stem cell transplant candidate 05/16/2015    Past Surgical History:  Procedure Laterality Date   ABDOMINAL HYSTERECTOMY     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   CARDIAC ELECTROPHYSIOLOGY MAPPING AND ABLATION     CARPAL TUNNEL RELEASE Bilateral    CHOLECYSTECTOMY  2008   COLONOSCOPY WITH PROPOFOL N/A 05/07/2017   Procedure: COLONOSCOPY WITH PROPOFOL;  Surgeon: [name], MD;  Location: [facility] ENDOSCOPY;  Service: Gastroenterology;  Laterality: N/A;   COLONOSCOPY WITH PROPOFOL N/A 12/19/2020   Procedure: COLONOSCOPY  WITH PROPOFOL;  Surgeon: [name], MD;  Location: [facility] ENDOSCOPY;  Service: Gastroenterology;  Laterality: N/A;   ESOPHAGOGASTRODUODENOSCOPY (EGD) WITH PROPOFOL N/A 05/07/2017   Procedure: ESOPHAGOGASTRODUODENOSCOPY (EGD) WITH PROPOFOL;  Surgeon: [name], MD;  Location: [facility] ENDOSCOPY;  Service: Gastroenterology;  Laterality: N/A;   FOOT SURGERY Bilateral    INCONTINENCE SURGERY  2009   INTERSTIM IMPLANT PLACEMENT     other     over active bladder   OTHER SURGICAL HISTORY     bladder stimulator    PARTIAL HYSTERECTOMY  03/1996   fibroids   PORTA CATH INSERTION N/A 03/10/2019   Procedure: PORTA CATH INSERTION;  Surgeon: Algernon Huxley, MD;  Location: [facility];  Service: Cardiovascular;  Laterality: N/A;   TONSILLECTOMY  2007    OB History   No obstetric history on file.      Home Medications    Prior to Admission medications   Medication Sig Start Date End Date Taking? Authorizing Provider  brompheniramine-pseudoephedrine-DM 30-2-10 MG/5ML syrup  Take 10 mLs by mouth 4 (four) times daily as needed for up to 7 days. 07/01/22 07/08/22 Yes Laurene Footman B, PA-C  lidocaine (XYLOCAINE) 2 % solution Use as directed 15 mLs in the mouth or throat every 3 (three) hours as needed for mouth pain (swish and spit). 07/01/22  Yes Danton Clap, PA-C  acyclovir (ZOVIRAX) 400 MG tablet Take 1 tablet (400 mg total) by mouth 2 (two) times daily. 03/28/21   Earlie Server, MD  allopurinol (ZYLOPRIM) 100 MG tablet TAKE 1 TABLET(100 MG) BY MOUTH DAILY as needed Patient not taking: Reported on 04/22/2022 12/02/18   Glean Hess, MD  atorvastatin (LIPITOR) 10 MG tablet Take 0.5 tablets (5 mg total) by mouth daily. 02/05/22 02/05/23  Annita Brod, MD  bisoprolol (ZEBETA) 10 MG tablet Take 1 tablet (10 mg total) by mouth daily. 05/29/22   Glean Hess, MD  calcium carbonate (OS-CAL) 1250 (500 Ca) MG chewable tablet Chew 2 tablets by mouth daily.    [provider]  diclofenac sodium  (VOLTAREN) 1 % GEL Apply 2 g topically 4 (four) times daily. 01/25/19   Gertie Baron, NP  diphenoxylate-atropine (LOMOTIL) 2.5-0.025 MG tablet Take 2 tablets by mouth 4 (four) times daily as needed for diarrhea or loose stools. 06/25/22   Hughie Closs, PA-C  DULoxetine (CYMBALTA) 60 MG capsule TAKE 1 CAPSULE(60 MG) BY MOUTH DAILY 03/15/22   Glean Hess, MD  empagliflozin (JARDIANCE) 10 MG TABS tablet Take 1 tablet by mouth daily. 05/09/22   [provider]  ENTRESTO 24-26 MG Take 1 tablet by mouth 2 (two) times daily. 03/15/22   [provider]  FLUoxetine (PROZAC) 40 MG capsule TAKE 1 CAPSULE EVERY DAY 11/06/21   Glean Hess, MD  furosemide (LASIX) 40 MG tablet Take 40 mg by mouth daily. 03/15/22   [provider]  glipiZIDE (GLUCOTROL XL) 2.5 MG 24 hr tablet Take 1 tablet (2.5 mg total) by mouth daily with breakfast. 05/29/22   Glean Hess, MD  glucose blood test strip  09/04/15   [provider]  guaiFENesin-dextromethorphan (ROBITUSSIN DM) 100-10 MG/5ML syrup Take 5 mLs by mouth every 4 (four) hours as needed for cough. 08/25/21   Lorella Nimrod, MD  magnesium chloride (SLOW-MAG) 64 MG TBEC SR tablet Take 1 tablet (64 mg total) by mouth daily. 06/26/22   Borders, Kirt Boys, NP  montelukast (SINGULAIR) 10 MG tablet Take 1 tablet (10 mg total) by mouth See admin instructions. Take 1 tab daily for 2 days after daratumumab treatments 06/06/21   Earlie Server, MD  Multiple Vitamin (MULTIVITAMIN) tablet Take 1 tablet by mouth daily.    [provider]  nitroGLYCERIN (NITROSTAT) 0.4 MG SL tablet Place 1 tablet (0.4 mg total) under the tongue every 5 (five) minutes as needed for chest pain. Patient not taking: Reported on 03/25/2022 08/25/21   Lorella Nimrod, MD  omeprazole (PRILOSEC) 40 MG capsule TAKE 1 CAPSULE IN THE MORNING AND TAKE 1 CAPSULE AT BEDTIME 05/30/22   Glean Hess, MD  potassium chloride SA (KLOR-CON M) 20 MEQ tablet Take 1 tablet  (20 mEq total) by mouth 2 (two) times daily. 06/26/22   Borders, Kirt Boys, NP  prochlorperazine (COMPAZINE) 25 MG suppository Place 1 suppository (25 mg total) rectally every 12 (twelve) hours as needed for nausea or vomiting. 06/26/22   Hughie Closs, PA-C  promethazine (PHENERGAN) 25 MG suppository Place 1 suppository (25 mg total) rectally every 6 (six) hours as  needed for nausea or vomiting. Patient not taking: Reported on 06/26/2022 06/25/22   Hughie Closs, PA-C  promethazine (PHENERGAN) 25 MG tablet Take 1 tablet (25 mg total) by mouth every 6 (six) hours as needed for nausea or vomiting. 06/18/22   Earlie Server, MD  tiZANidine (ZANAFLEX) 4 MG tablet Take 1 tablet (4 mg total) by mouth every 6 (six) hours as needed for muscle spasms. 05/29/22   Glean Hess, MD  traMADol (ULTRAM) 50 MG tablet Take 50 mg by mouth 2 (two) times daily. 09/20/21   [provider]  traZODone (DESYREL) 100 MG tablet TAKE 1 TABLET AT BEDTIME 02/19/22   Glean Hess, MD  vitamin E 45 MG (100 UNITS) capsule Take by mouth daily.    [provider]    Family History Family History  Problem Relation Age of Onset   Colon cancer Father    Renal Disease Father    Diabetes Mellitus II Father    Melanoma Paternal Grandmother    Breast cancer Maternal Aunt 25   Anemia Mother    Heart disease Mother    Heart failure Mother    Renal Disease Mother    Congestive Heart Failure Mother    Heart disease Maternal Uncle    Throat cancer Maternal Uncle    Lung cancer Maternal Uncle    Liver disease Maternal Uncle    Heart failure Maternal Uncle    Hearing loss Son 44       Suicide     Social History Social History   Tobacco Use   Smoking status: Former    Packs/day: 1.00    Years: 20.00    Total pack years: 20.00    Types: Cigarettes    Quit date: 07/02/1991    Years since quitting: 31.0   Smokeless tobacco: Never  Vaping Use   Vaping Use: Never used  Substance Use Topics    Alcohol use: Yes    Alcohol/week: 2.0 standard drinks of alcohol    Types: 2 Cans of beer per week   Drug use: No     Allergies   Oxycodone-acetaminophen, Celebrex [celecoxib], Codeine, Plerixafor, Benadryl [diphenhydramine], Morphine, Ondansetron, and Tylenol [acetaminophen]   Review of Systems Review of Systems  Constitutional:  Positive for fatigue. Negative for chills, diaphoresis and fever.  HENT:  Positive for congestion, rhinorrhea, sore throat and voice change. Negative for ear pain, sinus pressure and sinus pain.   Respiratory:  Positive for cough. Negative for shortness of breath.   Gastrointestinal:  Negative for abdominal pain, nausea and vomiting.  Musculoskeletal:  Negative for arthralgias and myalgias.  Skin:  Negative for rash.  Neurological:  Negative for weakness and headaches.  Hematological:  Negative for adenopathy.     Physical Exam Triage Vital Signs ED Triage Vitals [07/01/22 1403]  Enc Vitals Group     BP      Pulse      Resp      Temp      Temp src      SpO2      Weight      Height      Head Circumference      Peak Flow      Pain Score 7     Pain Loc      Pain Edu?      Excl. in [place]?    No data found.  Updated Vital Signs BP 110/69 (BP Location: Left Arm)   Pulse 66  Temp 98.9 F (37.2 C) (Oral)   Resp 18   SpO2 97%      Physical Exam Vitals and nursing note reviewed.  Constitutional:      General: She is not in acute distress.    Appearance: Normal appearance. She is not ill-appearing or toxic-appearing.  HENT:     Head: Normocephalic and atraumatic.     Nose: Congestion present.     Mouth/Throat:     Mouth: Mucous membranes are moist.     Pharynx: Oropharynx is clear. Posterior oropharyngeal erythema present.  Eyes:     General: No scleral icterus.       Right eye: No discharge.        Left eye: No discharge.     Conjunctiva/sclera: Conjunctivae normal.  Cardiovascular:     Rate and Rhythm: Normal rate and regular  rhythm.     Heart sounds: Normal heart sounds.  Pulmonary:     Effort: Pulmonary effort is normal. No respiratory distress.     Breath sounds: Normal breath sounds.  Musculoskeletal:     Cervical back: Neck supple.  Skin:    General: Skin is dry.  Neurological:     General: No focal deficit present.     Mental Status: She is alert. Mental status is at baseline.     Motor: No weakness.     Gait: Gait normal.  Psychiatric:        Mood and Affect: Mood normal.        Behavior: Behavior normal.        Thought Content: Thought content normal.      UC Treatments / Results  Labs (all labs ordered are listed, but only abnormal results are displayed) Labs Reviewed  RESP PANEL BY RT-PCR (RSV, FLU A&B, COVID)  [id number]  GROUP A STREP BY PCR    EKG   Radiology No results found.  Procedures Procedures (including critical care time)  Medications Ordered in UC Medications - No data to display  Initial Impression / Assessment and Plan / UC Course  I have reviewed the triage vital signs and the nursing notes.  Pertinent labs & imaging results that were available during my care of the patient were reviewed by me and considered in my medical decision making (see chart for details).   67 year old female presents for cough, congestion and sore throat for the past 5 days.  No fever.  No improvement with over-the-counter medication.  Vitals normal and stable and she is overall well-appearing.  No acute distress.  On exam she has nasal congestion and erythema posterior pharynx.  Chest clear auscultation heart regular rate rhythm.  PCR strep test and respiratory panel obtained.  Advised patient I will contact her on MyChart with results.  Are all negative.  Suspect other viral URI.  Supportive care encouraged.  I sent Bromfed-DM and this lidocaine to pharmacy.  Follow-up as needed.   Final Clinical Impressions(s) / UC Diagnoses   Final diagnoses:  Upper respiratory tract infection,  unspecified type  Acute cough  Sore throat     Discharge Instructions      -I will contact you on MyChart with the results of your test.  If you have strep I will send antibiotics. - You likely have a viral illness of the strep is negative.  Plenty rest and fluids.  I sent cough medicine and viscous lidocaine in the pharmacy.  He can also use cough drops and Chloraseptic spray. - Need to be seen again  if you have a fever, any extreme weakness or breathing difficulty.     ED Prescriptions     Medication Sig Dispense Auth. Provider   brompheniramine-pseudoephedrine-DM 30-2-10 MG/5ML syrup Take 10 mLs by mouth 4 (four) times daily as needed for up to 7 days. 150 mL Laurene Footman B, PA-C   lidocaine (XYLOCAINE) 2 % solution Use as directed 15 mLs in the mouth or throat every 3 (three) hours as needed for mouth pain (swish and spit). 100 mL Danton Clap, PA-C      PDMP not reviewed this encounter.   Danton Clap, PA-C 07/01/22 [phone]

## 2022-07-01 NOTE — ED Triage Notes (Signed)
Pt presents with a cough and sore throat x 5 days. She has an appointment at the cancer center in the morning and would like to be tested for covid and flu.

## 2022-07-02 ENCOUNTER — Ambulatory Visit: Payer: Medicare HMO

## 2022-07-02 ENCOUNTER — Inpatient Hospital Stay: Payer: Medicare PPO

## 2022-07-02 ENCOUNTER — Encounter: Payer: Self-pay | Admitting: Oncology

## 2022-07-02 ENCOUNTER — Encounter: Payer: Self-pay | Admitting: Internal Medicine

## 2022-07-02 ENCOUNTER — Other Ambulatory Visit: Payer: Medicare HMO

## 2022-07-02 ENCOUNTER — Inpatient Hospital Stay: Payer: Medicare PPO | Attending: Oncology

## 2022-07-02 VITALS — BP 105/71 | HR 57 | Temp 98.6°F | Resp 20

## 2022-07-02 DIAGNOSIS — Z800 Family history of malignant neoplasm of digestive organs: Secondary | ICD-10-CM | POA: Diagnosis not present

## 2022-07-02 DIAGNOSIS — N1832 Chronic kidney disease, stage 3b: Secondary | ICD-10-CM | POA: Diagnosis not present

## 2022-07-02 DIAGNOSIS — E538 Deficiency of other specified B group vitamins: Secondary | ICD-10-CM | POA: Insufficient documentation

## 2022-07-02 DIAGNOSIS — Z885 Allergy status to narcotic agent status: Secondary | ICD-10-CM | POA: Diagnosis not present

## 2022-07-02 DIAGNOSIS — E1122 Type 2 diabetes mellitus with diabetic chronic kidney disease: Secondary | ICD-10-CM | POA: Diagnosis not present

## 2022-07-02 DIAGNOSIS — Z886 Allergy status to analgesic agent status: Secondary | ICD-10-CM | POA: Insufficient documentation

## 2022-07-02 DIAGNOSIS — T451X5A Adverse effect of antineoplastic and immunosuppressive drugs, initial encounter: Secondary | ICD-10-CM | POA: Insufficient documentation

## 2022-07-02 DIAGNOSIS — D709 Neutropenia, unspecified: Secondary | ICD-10-CM | POA: Diagnosis not present

## 2022-07-02 DIAGNOSIS — Z822 Family history of deafness and hearing loss: Secondary | ICD-10-CM | POA: Insufficient documentation

## 2022-07-02 DIAGNOSIS — Z833 Family history of diabetes mellitus: Secondary | ICD-10-CM | POA: Diagnosis not present

## 2022-07-02 DIAGNOSIS — I350 Nonrheumatic aortic (valve) stenosis: Secondary | ICD-10-CM | POA: Insufficient documentation

## 2022-07-02 DIAGNOSIS — Z9049 Acquired absence of other specified parts of digestive tract: Secondary | ICD-10-CM | POA: Diagnosis not present

## 2022-07-02 DIAGNOSIS — C9001 Multiple myeloma in remission: Secondary | ICD-10-CM | POA: Insufficient documentation

## 2022-07-02 DIAGNOSIS — I129 Hypertensive chronic kidney disease with stage 1 through stage 4 chronic kidney disease, or unspecified chronic kidney disease: Secondary | ICD-10-CM | POA: Insufficient documentation

## 2022-07-02 DIAGNOSIS — Z888 Allergy status to other drugs, medicaments and biological substances status: Secondary | ICD-10-CM | POA: Diagnosis not present

## 2022-07-02 DIAGNOSIS — G620 Drug-induced polyneuropathy: Secondary | ICD-10-CM | POA: Insufficient documentation

## 2022-07-02 DIAGNOSIS — Z87891 Personal history of nicotine dependence: Secondary | ICD-10-CM | POA: Diagnosis not present

## 2022-07-02 DIAGNOSIS — R110 Nausea: Secondary | ICD-10-CM | POA: Insufficient documentation

## 2022-07-02 DIAGNOSIS — D801 Nonfamilial hypogammaglobulinemia: Secondary | ICD-10-CM | POA: Insufficient documentation

## 2022-07-02 DIAGNOSIS — Z808 Family history of malignant neoplasm of other organs or systems: Secondary | ICD-10-CM | POA: Diagnosis not present

## 2022-07-02 DIAGNOSIS — Z90710 Acquired absence of both cervix and uterus: Secondary | ICD-10-CM | POA: Diagnosis not present

## 2022-07-02 DIAGNOSIS — Z841 Family history of disorders of kidney and ureter: Secondary | ICD-10-CM | POA: Insufficient documentation

## 2022-07-02 DIAGNOSIS — Z79899 Other long term (current) drug therapy: Secondary | ICD-10-CM | POA: Diagnosis not present

## 2022-07-02 DIAGNOSIS — E876 Hypokalemia: Secondary | ICD-10-CM

## 2022-07-02 DIAGNOSIS — Z832 Family history of diseases of the blood and blood-forming organs and certain disorders involving the immune mechanism: Secondary | ICD-10-CM | POA: Insufficient documentation

## 2022-07-02 DIAGNOSIS — R197 Diarrhea, unspecified: Secondary | ICD-10-CM | POA: Diagnosis not present

## 2022-07-02 DIAGNOSIS — Z8249 Family history of ischemic heart disease and other diseases of the circulatory system: Secondary | ICD-10-CM | POA: Insufficient documentation

## 2022-07-02 DIAGNOSIS — Z801 Family history of malignant neoplasm of trachea, bronchus and lung: Secondary | ICD-10-CM | POA: Insufficient documentation

## 2022-07-02 DIAGNOSIS — C9000 Multiple myeloma not having achieved remission: Secondary | ICD-10-CM

## 2022-07-02 DIAGNOSIS — Z803 Family history of malignant neoplasm of breast: Secondary | ICD-10-CM | POA: Insufficient documentation

## 2022-07-02 LAB — BASIC METABOLIC PANEL
Anion gap: 6 (ref 5–15)
BUN: 24 mg/dL — ABNORMAL HIGH (ref 8–23)
CO2: 25 mmol/L (ref 22–32)
Calcium: 8.6 mg/dL — ABNORMAL LOW (ref 8.9–10.3)
Chloride: 107 mmol/L (ref 98–111)
Creatinine, Ser: 1.06 mg/dL — ABNORMAL HIGH (ref 0.44–1.00)
GFR, Estimated: 58 mL/min — ABNORMAL LOW (ref >60)
Glucose, Bld: 163 mg/dL — ABNORMAL HIGH (ref 70–99)
Potassium: 4.2 mmol/L (ref 3.5–5.1)
Sodium: 138 mmol/L (ref 135–145)

## 2022-07-02 LAB — MAGNESIUM: Magnesium: 1.3 mg/dL — ABNORMAL LOW (ref 1.7–2.4)

## 2022-07-02 MED ORDER — SODIUM CHLORIDE 0.9 % IV SOLN
Freq: Once | INTRAVENOUS | Status: AC
  Filled 2022-07-02: qty 250

## 2022-07-02 MED ORDER — SODIUM CHLORIDE 0.9% FLUSH
10.0000 mL | Freq: Once | INTRAVENOUS | Status: DC
  Filled 2022-07-02: qty 10

## 2022-07-02 MED ORDER — HEPARIN SOD (PORK) LOCK FLUSH 100 UNIT/ML IV SOLN
500.0000 [IU] | Freq: Once | INTRAVENOUS | Status: DC | PRN
  Filled 2022-07-02: qty 5

## 2022-07-02 MED ORDER — SODIUM CHLORIDE 0.9 % IV SOLN
25.0000 mg | Freq: Once | INTRAVENOUS | Status: AC
  Administered 2022-07-02: 25 mg via INTRAVENOUS
  Filled 2022-07-02: qty 1

## 2022-07-02 MED ORDER — MAGNESIUM SULFATE 2 GM/50ML IV SOLN
2.0000 g | Freq: Once | INTRAVENOUS | Status: AC
  Administered 2022-07-02: 2 g via INTRAVENOUS
  Filled 2022-07-02: qty 50

## 2022-07-02 MED ORDER — HEPARIN SOD (PORK) LOCK FLUSH 100 UNIT/ML IV SOLN
500.0000 [IU] | Freq: Once | INTRAVENOUS | Status: AC
  Administered 2022-07-02: 500 [IU] via INTRAVENOUS
  Filled 2022-07-02: qty 5

## 2022-07-02 MED ORDER — SODIUM CHLORIDE 0.9% FLUSH
10.0000 mL | Freq: Once | INTRAVENOUS | Status: DC | PRN
  Filled 2022-07-02: qty 10

## 2022-07-02 MED ORDER — SODIUM CHLORIDE 0.9 % IV SOLN: 4.0000 g | Freq: Once | INTRAVENOUS | Status: DC

## 2022-07-02 MED ORDER — MAGNESIUM SULFATE 4 GM/100ML IV SOLN
4.0000 g | Freq: Once | INTRAVENOUS | Status: AC
  Administered 2022-07-02: 4 g via INTRAVENOUS
  Filled 2022-07-02: qty 100

## 2022-07-08 ENCOUNTER — Inpatient Hospital Stay: Payer: Medicare PPO

## 2022-07-08 ENCOUNTER — Encounter: Payer: Self-pay | Admitting: Oncology

## 2022-07-08 ENCOUNTER — Encounter: Payer: Self-pay | Admitting: Internal Medicine

## 2022-07-08 VITALS — BP 103/65 | HR 70 | Resp 20

## 2022-07-08 DIAGNOSIS — E538 Deficiency of other specified B group vitamins: Secondary | ICD-10-CM | POA: Diagnosis not present

## 2022-07-08 DIAGNOSIS — T451X5A Adverse effect of antineoplastic and immunosuppressive drugs, initial encounter: Secondary | ICD-10-CM | POA: Diagnosis not present

## 2022-07-08 DIAGNOSIS — R197 Diarrhea, unspecified: Secondary | ICD-10-CM | POA: Diagnosis not present

## 2022-07-08 DIAGNOSIS — G620 Drug-induced polyneuropathy: Secondary | ICD-10-CM | POA: Diagnosis not present

## 2022-07-08 DIAGNOSIS — C9000 Multiple myeloma not having achieved remission: Secondary | ICD-10-CM

## 2022-07-08 DIAGNOSIS — R110 Nausea: Secondary | ICD-10-CM | POA: Diagnosis not present

## 2022-07-08 DIAGNOSIS — I350 Nonrheumatic aortic (valve) stenosis: Secondary | ICD-10-CM | POA: Diagnosis not present

## 2022-07-08 DIAGNOSIS — D801 Nonfamilial hypogammaglobulinemia: Secondary | ICD-10-CM | POA: Diagnosis not present

## 2022-07-08 DIAGNOSIS — C9001 Multiple myeloma in remission: Secondary | ICD-10-CM | POA: Diagnosis not present

## 2022-07-08 DIAGNOSIS — E876 Hypokalemia: Secondary | ICD-10-CM

## 2022-07-08 LAB — BASIC METABOLIC PANEL
Anion gap: 7 (ref 5–15)
BUN: 25 mg/dL — ABNORMAL HIGH (ref 8–23)
CO2: 26 mmol/L (ref 22–32)
Calcium: 8.1 mg/dL — ABNORMAL LOW (ref 8.9–10.3)
Chloride: 102 mmol/L (ref 98–111)
Creatinine, Ser: 1.29 mg/dL — ABNORMAL HIGH (ref 0.44–1.00)
GFR, Estimated: 46 mL/min — ABNORMAL LOW (ref >60)
Glucose, Bld: 136 mg/dL — ABNORMAL HIGH (ref 70–99)
Potassium: 4 mmol/L (ref 3.5–5.1)
Sodium: 135 mmol/L (ref 135–145)

## 2022-07-08 LAB — MAGNESIUM: Magnesium: 1.1 mg/dL — ABNORMAL LOW (ref 1.7–2.4)

## 2022-07-08 MED ORDER — HEPARIN SOD (PORK) LOCK FLUSH 100 UNIT/ML IV SOLN
500.0000 [IU] | Freq: Once | INTRAVENOUS | Status: AC
  Administered 2022-07-08: 500 [IU] via INTRAVENOUS
  Filled 2022-07-08: qty 5

## 2022-07-08 MED ORDER — MAGNESIUM SULFATE 2 GM/50ML IV SOLN
2.0000 g | Freq: Once | INTRAVENOUS | Status: AC
  Administered 2022-07-08: 2 g via INTRAVENOUS
  Filled 2022-07-08: qty 50

## 2022-07-08 MED ORDER — MAGNESIUM SULFATE 4 GM/100ML IV SOLN
4.0000 g | Freq: Once | INTRAVENOUS | Status: AC
  Administered 2022-07-08: 4 g via INTRAVENOUS
  Filled 2022-07-08: qty 100

## 2022-07-08 MED ORDER — SODIUM CHLORIDE 0.9 % IV SOLN: 6.0000 g | Freq: Once | INTRAVENOUS | Status: DC

## 2022-07-08 MED ORDER — SODIUM CHLORIDE 0.9 % IV SOLN
25.0000 mg | Freq: Once | INTRAVENOUS | Status: AC
  Administered 2022-07-08: 25 mg via INTRAVENOUS
  Filled 2022-07-08: qty 1

## 2022-07-08 MED ORDER — SODIUM CHLORIDE 0.9% FLUSH
10.0000 mL | Freq: Once | INTRAVENOUS | Status: AC
  Administered 2022-07-08: 10 mL via INTRAVENOUS
  Filled 2022-07-08: qty 10

## 2022-07-08 MED ORDER — SODIUM CHLORIDE 0.9 % IV SOLN
Freq: Once | INTRAVENOUS | Status: AC
  Filled 2022-07-08: qty 250

## 2022-07-09 ENCOUNTER — Other Ambulatory Visit: Payer: Medicare HMO

## 2022-07-09 ENCOUNTER — Ambulatory Visit: Payer: Medicare HMO

## 2022-07-12 ENCOUNTER — Encounter: Payer: Self-pay | Admitting: Internal Medicine

## 2022-07-12 ENCOUNTER — Inpatient Hospital Stay: Payer: Medicare PPO

## 2022-07-12 VITALS — BP 100/71 | HR 94 | Temp 96.7°F

## 2022-07-12 DIAGNOSIS — R110 Nausea: Secondary | ICD-10-CM | POA: Diagnosis not present

## 2022-07-12 DIAGNOSIS — R197 Diarrhea, unspecified: Secondary | ICD-10-CM | POA: Diagnosis not present

## 2022-07-12 DIAGNOSIS — D801 Nonfamilial hypogammaglobulinemia: Secondary | ICD-10-CM | POA: Diagnosis not present

## 2022-07-12 DIAGNOSIS — G620 Drug-induced polyneuropathy: Secondary | ICD-10-CM | POA: Diagnosis not present

## 2022-07-12 DIAGNOSIS — C9001 Multiple myeloma in remission: Secondary | ICD-10-CM

## 2022-07-12 DIAGNOSIS — C9000 Multiple myeloma not having achieved remission: Secondary | ICD-10-CM

## 2022-07-12 DIAGNOSIS — E538 Deficiency of other specified B group vitamins: Secondary | ICD-10-CM | POA: Diagnosis not present

## 2022-07-12 DIAGNOSIS — I350 Nonrheumatic aortic (valve) stenosis: Secondary | ICD-10-CM | POA: Diagnosis not present

## 2022-07-12 DIAGNOSIS — T451X5A Adverse effect of antineoplastic and immunosuppressive drugs, initial encounter: Secondary | ICD-10-CM | POA: Diagnosis not present

## 2022-07-12 LAB — MAGNESIUM: Magnesium: 1.5 mg/dL — ABNORMAL LOW (ref 1.7–2.4)

## 2022-07-12 MED ORDER — MAGNESIUM SULFATE 4 GM/100ML IV SOLN
4.0000 g | Freq: Once | INTRAVENOUS | Status: AC
  Administered 2022-07-12: 4 g via INTRAVENOUS

## 2022-07-12 MED ORDER — SODIUM CHLORIDE 0.9% FLUSH
10.0000 mL | Freq: Once | INTRAVENOUS | Status: AC | PRN
  Administered 2022-07-12: 10 mL
  Filled 2022-07-12: qty 10

## 2022-07-12 MED ORDER — SODIUM CHLORIDE 0.9 % IV SOLN
Freq: Once | INTRAVENOUS | Status: AC
  Filled 2022-07-12: qty 250

## 2022-07-12 MED ORDER — HEPARIN SOD (PORK) LOCK FLUSH 100 UNIT/ML IV SOLN
500.0000 [IU] | Freq: Once | INTRAVENOUS | Status: AC | PRN
  Administered 2022-07-12: 500 [IU]
  Filled 2022-07-12: qty 5

## 2022-07-12 MED ORDER — SODIUM CHLORIDE 0.9 % IV SOLN: 4.0000 g | Freq: Once | INTRAVENOUS | Status: DC

## 2022-07-12 MED ORDER — SODIUM CHLORIDE 0.9 % IV SOLN
25.0000 mg | Freq: Once | INTRAVENOUS | Status: AC
  Administered 2022-07-12: 25 mg via INTRAVENOUS
  Filled 2022-07-12: qty 1

## 2022-07-15 ENCOUNTER — Encounter: Payer: Self-pay | Admitting: Oncology

## 2022-07-15 ENCOUNTER — Inpatient Hospital Stay: Payer: Medicare PPO

## 2022-07-15 ENCOUNTER — Inpatient Hospital Stay (HOSPITAL_BASED_OUTPATIENT_CLINIC_OR_DEPARTMENT_OTHER): Payer: Medicare PPO | Admitting: Oncology

## 2022-07-15 ENCOUNTER — Other Ambulatory Visit: Payer: Self-pay | Admitting: Internal Medicine

## 2022-07-15 VITALS — BP 113/64 | HR 67 | Temp 97.5°F | Resp 18 | Wt 154.7 lb

## 2022-07-15 DIAGNOSIS — R197 Diarrhea, unspecified: Secondary | ICD-10-CM | POA: Diagnosis not present

## 2022-07-15 DIAGNOSIS — C9001 Multiple myeloma in remission: Secondary | ICD-10-CM

## 2022-07-15 DIAGNOSIS — I350 Nonrheumatic aortic (valve) stenosis: Secondary | ICD-10-CM | POA: Diagnosis not present

## 2022-07-15 DIAGNOSIS — C9000 Multiple myeloma not having achieved remission: Secondary | ICD-10-CM

## 2022-07-15 DIAGNOSIS — E538 Deficiency of other specified B group vitamins: Secondary | ICD-10-CM

## 2022-07-15 DIAGNOSIS — G620 Drug-induced polyneuropathy: Secondary | ICD-10-CM | POA: Diagnosis not present

## 2022-07-15 DIAGNOSIS — F5101 Primary insomnia: Secondary | ICD-10-CM

## 2022-07-15 DIAGNOSIS — E876 Hypokalemia: Secondary | ICD-10-CM

## 2022-07-15 DIAGNOSIS — T451X5A Adverse effect of antineoplastic and immunosuppressive drugs, initial encounter: Secondary | ICD-10-CM | POA: Diagnosis not present

## 2022-07-15 DIAGNOSIS — D801 Nonfamilial hypogammaglobulinemia: Secondary | ICD-10-CM | POA: Diagnosis not present

## 2022-07-15 DIAGNOSIS — G629 Polyneuropathy, unspecified: Secondary | ICD-10-CM

## 2022-07-15 DIAGNOSIS — R110 Nausea: Secondary | ICD-10-CM | POA: Diagnosis not present

## 2022-07-15 LAB — KAPPA/LAMBDA LIGHT CHAINS
Kappa free light chain: 41.1 mg/L — ABNORMAL HIGH (ref 3.3–19.4)
Kappa, lambda light chain ratio: 1.2 (ref 0.26–1.65)
Lambda free light chains: 34.3 mg/L — ABNORMAL HIGH (ref 5.7–26.3)

## 2022-07-15 LAB — MAGNESIUM: Magnesium: 1.6 mg/dL — ABNORMAL LOW (ref 1.7–2.4)

## 2022-07-15 MED ORDER — MAGNESIUM SULFATE 2 GM/50ML IV SOLN
2.0000 g | Freq: Once | INTRAVENOUS | Status: AC
  Administered 2022-07-15: 2 g via INTRAVENOUS
  Filled 2022-07-15: qty 50

## 2022-07-15 MED ORDER — SODIUM CHLORIDE 0.9% FLUSH
10.0000 mL | Freq: Once | INTRAVENOUS | Status: AC
  Administered 2022-07-15: 10 mL via INTRAVENOUS
  Filled 2022-07-15: qty 10

## 2022-07-15 MED ORDER — HEPARIN SOD (PORK) LOCK FLUSH 100 UNIT/ML IV SOLN
500.0000 [IU] | Freq: Once | INTRAVENOUS | Status: AC
  Administered 2022-07-15: 500 [IU] via INTRAVENOUS
  Filled 2022-07-15: qty 5

## 2022-07-15 MED ORDER — SODIUM CHLORIDE 0.9 % IV SOLN
Freq: Once | INTRAVENOUS | Status: AC
  Filled 2022-07-15: qty 250

## 2022-07-15 NOTE — Assessment & Plan Note (Signed)
Continue B12 monthly injection.  B12 level has improved.  I will hold off B12 injections.

## 2022-07-15 NOTE — Progress Notes (Signed)
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  Light chain ratio is normal. 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 Repeat immunofixation negative. . Labs reviewed and discussed with patient, stable M protein and stable light chain ratio Today's level is pending.   Daratumumab on hold due to frequent infections despite IVIG Continue acyclovir prophylaxis.    B12 deficiency Continue B12 monthly injection.  B12 level has improved.  I will hold off B12 injections.  Diarrhea chronic diarrhea,antidiarrhea PRN.  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   Previously on Lyrica and nortriptyline-not effective Neurology recommend Tramadol and may consider higher dose of  Nortriptyline in the future. Follows up with neurology.  Hypomagnesemia #Chronic hypomagnesemia proceed with  IV magnesium today.  Continue weekly magnesium +/- IV magnesium.      No orders of the defined types were placed in this encounter.  Follow up  Per LOS All questions were answered. The patient knows to call the clinic with any problems, questions or concerns.  Earlie Server, MD, PhD [facility] Hematology Oncology 07/15/2022    Chief Complaint: Sarah [name] is a 67 y.o. 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    PERTINENT ONCOLOGY HISTORY Sarah [name] is a 67 y.o.afemale who has above oncology history reviewed by me today presented for follow up visit for management of multiple  myeloma Patient previously followed up by Dr.Corcoran, patient switched care to me on 11/23/20 Extensive medical record review was performed by me  stage III IgA lambda light chain multiple myeloma s/p autologous stem cell transplant on 06/14/2015 at the [facility] and second autologous stem cell transplant on 05/10/2020 at [facility].   Initial bone marrow revealed 80% plasma cells.  Lambda free light chains were 1340.  She had nephrotic range proteinuria.  She initially underwent induction with RVD.  Revlimid maintenance was discontinued on 01/21/2017 secondary to intolerance.     07/19/2019 Bone marrow aspirate and biopsy on  revealed a normocellular marrow with but increased lambda-restricted plasma cells (9% aspirate, 40% CD138 immunohistochemistry).  Findings were consistent with recurrent plasma cell myeloma.  Flow cytometry revealed no monoclonal B-cell or phenotypically aberrant T-cell population. Cytogenetics were 59, XX (normal).  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    Lambda light chains have been followed: 22.2 (ratio 0.56) on 07/03/2017, 30.8 (ratio 0.78) on 09/02/2017, 36.9 (ratio 0.40) on 10/21/2017, 37.4 (ratio 0.41) on 12/16/2017, 70.7(ratio 0.31)  on 02/17/2018, 64.2 (ratio 0.27) on 04/07/2018, 78.9 (ratio 0.18) on 05/26/2018, 128.8 (ratio 0.17) on 08/06/2018, 181.5 (ratio 0.13) on 10/08/2018, 130.9 (ratio 0.13) on 10/20/2018, 160.7 (ratio 0.10) on 12/09/2018, 236.6 (ratio 0.07) on 02/01/2019, 363.6 (ratio 0.04) on 03/22/2019, 404.8 (ratio 0.04) on 04/05/2019, 420.7 (ratio 0.03) on 05/24/2019, 573.4 (ratio 0.03) on 06/23/2019, 451.05 (ratio 0.02) on 08/20/2019, 47.2 (ratio 0.13) on 10/25/2019, 22.4 (ratio 0.21) on 11/25/2019, 16.5 (ratio 0.33) on 01/10/2020, 14.6 (ratio 0.34) on 02/07/2020, 13.1 (ratio 0.31) on 03/07/2020, 10.1 (ratio 0.38) on 04/10/2020, and 9.5 (ratio 0.21) on 06/19/2020.   24 hour UPEP on 06/03/2019  revealed kappa free light chains 95.76, lambda  free light chains 1,260.71, and ratio 0.08.  24 hour UPEP on 08/23/2019 revealed total protein of 782 mg/24 hrs with lambda free light chains 1,084.16 mg/L and ratio of 0.10 (1.03-31.76).  M spike in urine was 46.1% (361 mg/24 hrs).    Bone survey on 04/08/2016 and 05/28/2017 revealed no definite lytic lesion seen in the visualized skeleton.  Bone survey on 11/19/2018 revealed no suspicious lucent lesions and no acute bony abnormality.  PET scan on 07/12/2019 revealed no focal metabolic activity to suggest active myeloma within the skeleton. There were no lytic lesions identified on the CT portion of the exam or soft tissue plasmacytomas. There was no evidence of multiple myeloma.    Pretreatment RBC phenotype on 09/23/2019 was positive for C, e, DUFFY B, KIDD B, M, S, and s antigen; negative for c, E, KELL, DUFFY A, KIDD A, and N antigen.    09/27/2019 - 10/25/2019; 12/09/2019 - 03/13/2020 6 cycles of daratumumab and hyaluronidase-fihj, Pomalyst, and Decadron (DPd) .  Cycle #1 was complicated by fever and neutropenia requiring admission.  Cycle #2 was complicated by pneumonia requiring admission.  Cycle #6 was complicated with an ER evaluation for an elevated lactic acid.   05/10/2020 second autologous stem cell transplant at [facility]   She underwent conditioning with melphalan 140 mg/m2.    She is s/p week #3 Velcade maintenance (began 08/31/2020 - 09/28/2020).  She has no increase in neuropathy.  She has severe aortic stenosis.  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 Cardiology is following for a possible TAVR in the future.  Echo on 07/05/2020 revealed moderate to severe aortic stenosis with an EF of 50-55%.  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 Zometa was discontinued in 01/2017.  She has chronic nausea on Phenergan.     B12 deficiency.  B12 was 254 on 04/09/2017, 295 on 08/20/2018, and 391 on  10/08/2018.  She was on oral B12.  She received B12 monthly (last 09/14/2020).  Folate was 12.1 on 01/10/2020.    She has iron deficiency.  Ferritin was 32 on 07/01/2019.  She received Venofer on 07/15/2019 and 07/22/2019.   She has hypogammaglobulinemia.  IgG was 245 on 11/25/2019.  She received monthly IVIG (07/22/021 - 03/22/2020).  She received IVIG 400 mg/kg on 01/20/2020, 200 mg/kg on 02/17/2020, and 300 mg/kg on 03/22/2020.  IVIG on 03/23/3006 was complicated by acute renal failure. IgG trough level was 418 on 02/17/2020.  10/15/2019 - 10/20/2019 She was admitted to [facility] with fever and neutropenia.  Cultures were negative.  CXR was negative. She received broad spectrum antibiotics and daily Granix.  She received IVF for acute renal insufficiency due to diarrhea and dehydration.  Creatine was 1.66 on admission and 1.12 on discharge.    03/01/2020 - 03/05/2020 admitted to [facility] from with fever and neutropenia. CXR revealed no active cardiopulmonary disease. Chest CT with contrast revealed no acute intrathoracic pathology. There were findings which could be suggestive of prior granulomatous disease. She was treated with Cefepime and Vancomycin, then switched to ciprofloxacin on 03/03/2020.   05/08/2020 - 12/05/2021The patient was admitted to [facility] from  for autologous bone marrow transplant.   She received conditioning with melphalan 140 mg/m2.  Bone marrow transplant was on 05/10/2020. The patient developed fevers daily from 05/19/2020 - 05/24/2020. Blood cultures were + for strept sanguis bacteremia.  She was  treated cefepime, vancomycin, and solumedrol for possible engraftment syndrome. Cefepime was switched to ceftriaxone; she completed 2 weeks of ceftriaxone on 06/03/2020. She had chemotherapy induced diarrhea.  She experienced urinary retention.  Feb 2022 patient has been on maintenance Velcade 2 mg every 2 weeks.  Chronic diarrhea seen by gastroenterology Dr. Vicente Males and was recommended to avoid  artificial sugars in her diet including Diet Coke and coffee mate.  She feels some improvement of her diarrhea frequency since the dietary modification.  GI profile PCR negative, C. difficile toxin A+ B negative, fecal calprotectin 77 12/19/2020 colonoscopy showed 2 subcentimeter polyps in the ascending colon, resected and removed.  Pathology showed tubular adenoma.  No malignancy.  Normal mucosa in the entire examined colon.  Biopsied, pathology showed colonic mucosa with no significant pathological alteration.  Negative for active inflammation and features of chronicity.  Negative for microscopic colitis, dysplasia, and malignanc  Diabetes, patient has discontinued metformin due to diarrhea and started on glipizide 2.5 mg   03/05/2021-03/09/2021 Patient was hospitalized due to fever and chills, nonproductive cough, shortness of breath. X-ray showed right lower lobe atelectasis versus pneumonia.  Blood cultures negative.  Urine culture showed 60,000 colonies of Enterococcus facialis.  Patient received antibiotics. Patient also had abdominal pelvis CT scan for work-up of intermittent lower abdomen pain.No acute abnormality.   05/14/21 last dose of Velcade maintenance.  establish care with neurology Dr. Gurney Maxin and her neuropathy regimen has been switched from gabapentin to Lyrica. 05/29/2021 patient got influenza   neuropathy medication has been switched from gabapentin/Lyrica to nortriptyline.  08/18/2021 Hospitalized due to pneumonia from metapnuemovirus, hospitalization was complicated with NSTEMI. She received heparin gtt.  Treated with antibiotics for coverage of pneumonia,  diuretics for pulmonary edema. She received IVIG '400mg'$ /kg x 1 for hypoglobulinemia. Discharged on 08/25/21 with Augmentin and Zithromax.   May 2023, Daratumumab being held for [facility] infectious disease physician Dr. Fatima Sanger due to frequent infection.  Diagnosed with CHF [HFrEF] -NYHA class II-III, she follows up with  cardiology, started on entresto  hospitalization due to CHF exacerbation, community acquired pneumonia.   INTERVAL HISTORY Sarah [name] is a 67 y.o. female who has above history reviewed by me today presents for follow up visit for management of multiple myeloma.  Patient has been on weekly magnesium infusion as needed hypomagnesemia + CHF [nonischemic cardiomyopathy], aortic stenosis, follows up with cardiology. + Patient continues to have chronic cough, nonproductive.  No fever or chills.  Some nasal congestion.  Presented to emergency room on 07/01/2022 Respiratory panel RT-PCR negative for COVID-19, influenza and RSV. Past Medical History:  Diagnosis Date   Anemia    Anxiety    Aortic stenosis    a. 05/2020 Echo: EF 45-50%, sev AS - seen by TAVR team @ [facility] - CTA sugg of tricuspid valve w/ fusing of 2 leaflets-TAVR deferred in setting of acute infxn; b. 07/2020 Echo: EF 50-55%, mod-sev AS; c. 12/2020 Echo: EF>55%. Mod-sev paradoxical low-flow low-gradient AS; d. 08/2021 Echo: EF 35-40%, severe AS, triv AI, mild MR.   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 Variable EF over time; b. 08/2017 Echo: EF 40%; b. 03/2020 Echo: EF 55-60%; c. 05/2020 Echo: EF 45-50%; d. 07/2020 Echo: EF 50-55%; e. 12/2020 Echo: EF>55%; f. 08/2021 Echo: EF 35-40%.   Chronic heart failure with preserved ejection fraction (HFpEF) ([place])  a. Variable EF over time; b. 08/2017 Echo: EF 40%; b. 03/2020 Echo: EF 55-60%; c. 05/2020 Echo: EF 45-50%; d. 07/2020 Echo: EF 50-55%; e. 12/2020 Echo: EF>55%; f. 08/2021 Echo: EF 35-40%, no RV, mildly dil LA, mild MR, triv AI, severe AS.   CKD (chronic kidney disease) stage 3, GFR 30-59 ml/min (HCC)    Depression    Diabetes mellitus (HCC)    Dizziness    Fatty liver    Frequent falls    GERD (gastroesophageal reflux disease)    Gout    Heart murmur    History of  blood transfusion    History of bone marrow transplant ([place])    History of uterine fibroid    Hx of cardiac catheterization    a. 01/2016 Cath [facility] - after abnl nuc): Nl cors.   Hypertension    Hypomagnesemia    IDA (iron deficiency anemia)    Multiple myeloma (HCC)    Personal history of chemotherapy    PSVT (paroxysmal supraventricular tachycardia)    a. S/p [facility]).   PVC's (premature ventricular contractions)    a. Well-managed w/ bisoprolol in outpt setting.   Renal cyst     Past Surgical History:  Procedure Laterality Date   ABDOMINAL HYSTERECTOMY     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   CARDIAC ELECTROPHYSIOLOGY MAPPING AND ABLATION     CARPAL TUNNEL RELEASE Bilateral    CHOLECYSTECTOMY  2008   COLONOSCOPY WITH PROPOFOL N/A 05/07/2017   Procedure: COLONOSCOPY WITH PROPOFOL;  Surgeon: [name], MD;  Location: [facility] ENDOSCOPY;  Service: Gastroenterology;  Laterality: N/A;   COLONOSCOPY WITH PROPOFOL N/A 12/19/2020   Procedure: COLONOSCOPY WITH PROPOFOL;  Surgeon: [name], MD;  Location: [facility] ENDOSCOPY;  Service: Gastroenterology;  Laterality: N/A;   ESOPHAGOGASTRODUODENOSCOPY (EGD) WITH PROPOFOL N/A 05/07/2017   Procedure: ESOPHAGOGASTRODUODENOSCOPY (EGD) WITH PROPOFOL;  Surgeon: [name], MD;  Location: [facility] ENDOSCOPY;  Service: Gastroenterology;  Laterality: N/A;   FOOT SURGERY Bilateral    INCONTINENCE SURGERY  2009   INTERSTIM IMPLANT PLACEMENT     other     over active bladder   OTHER SURGICAL HISTORY     bladder stimulator    PARTIAL HYSTERECTOMY  03/1996   fibroids   PORTA CATH INSERTION N/A 03/10/2019   Procedure: PORTA CATH INSERTION;  Surgeon: Algernon Huxley, MD;  Location: [place] CV LAB;  Service: Cardiovascular;  Laterality: N/A;   TONSILLECTOMY  2007    Family History  Problem Relation Age of Onset   Colon cancer Father    Renal Disease Father    Diabetes Mellitus II Father    Melanoma Paternal Grandmother    Breast cancer  Maternal Aunt 59   Anemia Mother    Heart disease Mother    Heart failure Mother    Renal Disease Mother    Congestive Heart Failure Mother    Heart disease Maternal Uncle    Throat cancer Maternal Uncle    Lung cancer Maternal Uncle    Liver disease Maternal Uncle    Heart failure Maternal Uncle    Hearing loss Son 6       Suicide     Social History:  reports that she quit smoking about 31 years ago. Her smoking use included cigarettes. She has a 20.00 pack-year smoking history. She has never used smokeless tobacco. She reports current alcohol use of about 2.0 standard drinks of alcohol per week. She reports that she does not use drugs.  She is on disability. She notes exposure to perchloroethylene [facility]).   Allergies:  Allergies  Allergen Reactions   Oxycodone-Acetaminophen Anaphylaxis    Swelling and rash   Celebrex [Celecoxib] Diarrhea   Codeine    Plerixafor     In 2016 during ASCT collection patient developed fever to 103.23F and required hospitalization   Benadryl [Diphenhydramine] Palpitations   Morphine Itching and Rash   Ondansetron Diarrhea   Tylenol [Acetaminophen] Itching and Rash    Current Medications: Current Outpatient Medications  Medication Sig Dispense Refill   acyclovir (ZOVIRAX) 400 MG tablet Take 1 tablet (400 mg total) by mouth 2 (two) times daily. 180 tablet 1   atorvastatin (LIPITOR) 10 MG tablet Take 0.5 tablets (5 mg total) by mouth daily. 15 tablet 11   bisoprolol (ZEBETA) 10 MG tablet Take 1 tablet (10 mg total) by mouth daily. 90 tablet 1   calcium carbonate (OS-CAL) 1250 (500 Ca) MG chewable tablet Chew 2 tablets by mouth daily.     diclofenac sodium (VOLTAREN) 1 % GEL Apply 2 g topically 4 (four) times daily. 100 g 1   diphenoxylate-atropine (LOMOTIL) 2.5-0.025 MG tablet Take 2 tablets by mouth 4 (four) times daily as needed for diarrhea or loose stools. 192 tablet 3   DULoxetine (CYMBALTA) 60 MG capsule TAKE 1 CAPSULE(60 MG) BY MOUTH DAILY 90  capsule 0   empagliflozin (JARDIANCE) 10 MG TABS tablet Take 1 tablet by mouth daily.     ENTRESTO 24-26 MG Take 1 tablet by mouth 2 (two) times daily.     FLUoxetine (PROZAC) 40 MG capsule TAKE 1 CAPSULE EVERY DAY 90 capsule 0   furosemide (LASIX) 40 MG tablet Take 40 mg by mouth daily.     glipiZIDE (GLUCOTROL XL) 2.5 MG 24 hr tablet Take 1 tablet (2.5 mg total) by mouth daily with breakfast. 30 tablet 0   glucose blood test strip      lidocaine (XYLOCAINE) 2 % solution Use as directed 15 mLs in the mouth or throat every 3 (three) hours as needed for mouth pain (swish and spit). 100 mL 0   magnesium chloride (SLOW-MAG) 64 MG TBEC SR tablet Take 1 tablet (64 mg total) by mouth daily. 60 tablet 1   montelukast (SINGULAIR) 10 MG tablet Take 1 tablet (10 mg total) by mouth See admin instructions. Take 1 tab daily for 2 days after daratumumab treatments 30 tablet 1   Multiple Vitamin (MULTIVITAMIN) tablet Take 1 tablet by mouth daily.     omeprazole (PRILOSEC) 40 MG capsule TAKE 1 CAPSULE IN THE MORNING AND TAKE 1 CAPSULE AT BEDTIME 180 capsule 1   potassium chloride SA (KLOR-CON M) 20 MEQ tablet Take 1 tablet (20 mEq total) by mouth 2 (two) times daily. 30 tablet 0   prochlorperazine (COMPAZINE) 25 MG suppository Place 1 suppository (25 mg total) rectally every 12 (twelve) hours as needed for nausea or vomiting. 12 suppository 0   promethazine (PHENERGAN) 25 MG tablet Take 1 tablet (25 mg total) by mouth every 6 (six) hours as needed for nausea or vomiting. 90 tablet 1   tiZANidine (ZANAFLEX) 4 MG tablet Take 1 tablet (4 mg total) by mouth every 6 (six) hours as needed for muscle spasms. 90 tablet 0   traMADol (ULTRAM) 50 MG tablet Take 50 mg by mouth 2 (two) times daily.     vitamin E 45 MG (100 UNITS) capsule Take by mouth daily.     allopurinol (ZYLOPRIM) 100 MG tablet TAKE 1 TABLET(100  MG) BY MOUTH DAILY as needed (Patient not taking: Reported on 04/22/2022) 90 tablet 1    guaiFENesin-dextromethorphan (ROBITUSSIN DM) 100-10 MG/5ML syrup Take 5 mLs by mouth every 4 (four) hours as needed for cough. (Patient not taking: Reported on 07/15/2022) 118 mL 0   nitroGLYCERIN (NITROSTAT) 0.4 MG SL tablet Place 1 tablet (0.4 mg total) under the tongue every 5 (five) minutes as needed for chest pain. (Patient not taking: Reported on 03/25/2022) 20 tablet 12   promethazine (PHENERGAN) 25 MG suppository Place 1 suppository (25 mg total) rectally every 6 (six) hours as needed for nausea or vomiting. (Patient not taking: Reported on 06/26/2022) 12 each 0   traZODone (DESYREL) 100 MG tablet TAKE 1 TABLET AT BEDTIME 90 tablet 3   No current facility-administered medications for this visit.   Facility-Administered Medications Ordered in Other Visits  Medication Dose Route Frequency Provider Last Rate Last Admin   sodium chloride flush (NS) 0.9 % injection 10 mL  10 mL Intravenous PRN Earlie Server, MD   10 mL at 04/02/21 5681    Review of Systems  Constitutional:  Negative for chills, fever, malaise/fatigue and weight loss.  HENT:  Negative for sore throat.   Eyes:  Negative for redness.  Respiratory:  Negative for cough, shortness of breath and wheezing.   Cardiovascular:  Negative for chest pain, palpitations and leg swelling.  Gastrointestinal:  Positive for diarrhea. Negative for abdominal pain, blood in stool, nausea and vomiting.  Genitourinary:  Negative for dysuria.  Musculoskeletal:  Positive for back pain and joint pain. Negative for myalgias.  Skin:  Negative for rash.  Neurological:  Positive for tingling and sensory change. Negative for dizziness and tremors.  Endo/Heme/Allergies:  Does not bruise/bleed easily.  Psychiatric/Behavioral:  Negative for hallucinations.     Performance status (ECOG): 1  Vitals Blood pressure 113/64, pulse 67, temperature (!) 97.5 F (36.4 C), resp. rate 18, weight 154 lb 11.2 oz (70.2 kg).  Physical Exam Constitutional:      General:  She is not in acute distress.    Appearance: She is not diaphoretic.  HENT:     Head: Normocephalic and atraumatic.     Nose: Nose normal.     Mouth/Throat:     Pharynx: No oropharyngeal exudate.  Eyes:     General: No scleral icterus.    Pupils: Pupils are equal, round, and reactive to light.  Cardiovascular:     Rate and Rhythm: Normal rate.     Heart sounds: Murmur heard.  Pulmonary:     Effort: Pulmonary effort is normal. No respiratory distress.     Breath sounds: No rales.  Chest:     Chest wall: No tenderness.  Abdominal:     General: There is no distension.     Palpations: Abdomen is soft.     Tenderness: There is no abdominal tenderness.  Musculoskeletal:        General: Normal range of motion.     Cervical back: Normal range of motion and neck supple.  Skin:    General: Skin is warm and dry.     Findings: No erythema.  Neurological:     Mental Status: She is alert and oriented to person, place, and time.     Cranial Nerves: No cranial nerve deficit.     Motor: No abnormal muscle tone.     Coordination: Coordination normal.  Psychiatric:        Mood and Affect: Mood and affect normal.  Laboratory data    Latest Ref Rng & Units 06/26/2022    9:01 AM 06/18/2022   10:40 AM 05/20/2022    9:02 AM  CBC  WBC 4.0 - 10.5 K/uL 8.7  6.3  5.8   Hemoglobin 12.0 - 15.0 g/dL 14.4  12.1  11.7   Hematocrit 36.0 - 46.0 % 40.7  35.8  36.4   Platelets 150 - 400 K/uL 121  124  119       Latest Ref Rng & Units 07/08/2022    8:44 AM 07/02/2022    8:26 AM 06/27/2022    8:15 AM  CMP  Glucose 70 - 99 mg/dL 136  163  138   BUN 8 - 23 mg/dL 25  24  49   Creatinine 0.44 - 1.00 mg/dL 1.29  1.06  1.69   Sodium 135 - 145 mmol/L 135  138  134   Potassium 3.5 - 5.1 mmol/L 4.0  4.2  3.4   Chloride 98 - 111 mmol/L 102  107  111   CO2 22 - 32 mmol/L '26  25  14   '$ Calcium 8.9 - 10.3 mg/dL 8.1  8.6  8.1   Total Protein 6.5 - 8.1 g/dL   7.1   Total Bilirubin 0.3 - 1.2 mg/dL   0.5    Alkaline Phos 38 - 126 U/L   97   AST 15 - 41 U/L   34   ALT 0 - 44 U/L   42

## 2022-07-15 NOTE — Assessment & Plan Note (Signed)
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   Previously on Lyrica and nortriptyline-not effective Neurology recommend Tramadol and may consider higher dose of  Nortriptyline in the future. Follows up with neurology. 

## 2022-07-15 NOTE — Assessment & Plan Note (Signed)
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  Light chain ratio is normal. 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 Repeat immunofixation negative. . Labs reviewed and discussed with patient, stable M protein and stable light chain ratio Today's level is pending.   Daratumumab on hold due to frequent infections despite IVIG Continue acyclovir prophylaxis.

## 2022-07-15 NOTE — Assessment & Plan Note (Signed)
#  Chronic hypomagnesemia proceed with  IV magnesium today.  Continue weekly magnesium +/- IV magnesium.

## 2022-07-15 NOTE — Assessment & Plan Note (Signed)
chronic diarrhea,antidiarrhea PRN. 

## 2022-07-18 ENCOUNTER — Ambulatory Visit: Payer: Medicare HMO

## 2022-07-18 ENCOUNTER — Other Ambulatory Visit: Payer: Medicare HMO

## 2022-07-18 ENCOUNTER — Ambulatory Visit: Payer: Medicare HMO | Admitting: Oncology

## 2022-07-18 DIAGNOSIS — C9002 Multiple myeloma in relapse: Secondary | ICD-10-CM | POA: Diagnosis not present

## 2022-07-18 DIAGNOSIS — Z9481 Bone marrow transplant status: Secondary | ICD-10-CM | POA: Diagnosis not present

## 2022-07-18 DIAGNOSIS — I420 Dilated cardiomyopathy: Secondary | ICD-10-CM | POA: Diagnosis not present

## 2022-07-18 DIAGNOSIS — I352 Nonrheumatic aortic (valve) stenosis with insufficiency: Secondary | ICD-10-CM | POA: Diagnosis not present

## 2022-07-18 DIAGNOSIS — I5022 Chronic systolic (congestive) heart failure: Secondary | ICD-10-CM | POA: Diagnosis not present

## 2022-07-22 ENCOUNTER — Encounter: Payer: Self-pay | Admitting: Emergency Medicine

## 2022-07-22 ENCOUNTER — Inpatient Hospital Stay: Payer: Medicare PPO

## 2022-07-22 ENCOUNTER — Emergency Department: Payer: Medicare PPO

## 2022-07-22 ENCOUNTER — Other Ambulatory Visit: Payer: Self-pay

## 2022-07-22 ENCOUNTER — Emergency Department
Admission: EM | Admit: 2022-07-22 | Discharge: 2022-07-22 | Disposition: A | Discharge status: Home / Self Care | Payer: Medicare PPO | Attending: Emergency Medicine | Admitting: Emergency Medicine

## 2022-07-22 VITALS — BP 106/60 | HR 40

## 2022-07-22 DIAGNOSIS — I130 Hypertensive heart and chronic kidney disease with heart failure and stage 1 through stage 4 chronic kidney disease, or unspecified chronic kidney disease: Secondary | ICD-10-CM | POA: Insufficient documentation

## 2022-07-22 DIAGNOSIS — N1831 Chronic kidney disease, stage 3a: Secondary | ICD-10-CM | POA: Diagnosis not present

## 2022-07-22 DIAGNOSIS — E876 Hypokalemia: Secondary | ICD-10-CM

## 2022-07-22 DIAGNOSIS — R079 Chest pain, unspecified: Secondary | ICD-10-CM | POA: Diagnosis not present

## 2022-07-22 DIAGNOSIS — E785 Hyperlipidemia, unspecified: Secondary | ICD-10-CM | POA: Insufficient documentation

## 2022-07-22 DIAGNOSIS — E1122 Type 2 diabetes mellitus with diabetic chronic kidney disease: Secondary | ICD-10-CM | POA: Diagnosis not present

## 2022-07-22 DIAGNOSIS — E538 Deficiency of other specified B group vitamins: Secondary | ICD-10-CM | POA: Diagnosis not present

## 2022-07-22 DIAGNOSIS — C9001 Multiple myeloma in remission: Secondary | ICD-10-CM | POA: Diagnosis not present

## 2022-07-22 DIAGNOSIS — C9000 Multiple myeloma not having achieved remission: Secondary | ICD-10-CM

## 2022-07-22 DIAGNOSIS — G620 Drug-induced polyneuropathy: Secondary | ICD-10-CM | POA: Diagnosis not present

## 2022-07-22 DIAGNOSIS — I5022 Chronic systolic (congestive) heart failure: Secondary | ICD-10-CM | POA: Diagnosis not present

## 2022-07-22 DIAGNOSIS — H109 Unspecified conjunctivitis: Secondary | ICD-10-CM | POA: Insufficient documentation

## 2022-07-22 DIAGNOSIS — E1169 Type 2 diabetes mellitus with other specified complication: Secondary | ICD-10-CM | POA: Diagnosis present

## 2022-07-22 DIAGNOSIS — D801 Nonfamilial hypogammaglobulinemia: Secondary | ICD-10-CM | POA: Diagnosis not present

## 2022-07-22 DIAGNOSIS — T451X5A Adverse effect of antineoplastic and immunosuppressive drugs, initial encounter: Secondary | ICD-10-CM | POA: Diagnosis not present

## 2022-07-22 DIAGNOSIS — D631 Anemia in chronic kidney disease: Secondary | ICD-10-CM | POA: Diagnosis not present

## 2022-07-22 DIAGNOSIS — I10 Essential (primary) hypertension: Secondary | ICD-10-CM | POA: Diagnosis present

## 2022-07-22 DIAGNOSIS — R001 Bradycardia, unspecified: Secondary | ICD-10-CM | POA: Diagnosis not present

## 2022-07-22 DIAGNOSIS — I350 Nonrheumatic aortic (valve) stenosis: Secondary | ICD-10-CM | POA: Diagnosis not present

## 2022-07-22 DIAGNOSIS — R197 Diarrhea, unspecified: Secondary | ICD-10-CM | POA: Diagnosis not present

## 2022-07-22 DIAGNOSIS — R110 Nausea: Secondary | ICD-10-CM | POA: Diagnosis not present

## 2022-07-22 LAB — MULTIPLE MYELOMA PANEL, SERUM
Albumin SerPl Elph-Mcnc: 4 g/dL (ref 2.9–4.4)
Albumin/Glob SerPl: 1.4 (ref 0.7–1.7)
Alpha 1: 0.3 g/dL (ref 0.0–0.4)
Alpha2 Glob SerPl Elph-Mcnc: 1 g/dL (ref 0.4–1.0)
B-Globulin SerPl Elph-Mcnc: 1 g/dL (ref 0.7–1.3)
Gamma Glob SerPl Elph-Mcnc: 0.7 g/dL (ref 0.4–1.8)
Globulin, Total: 3 g/dL (ref 2.2–3.9)
IgA: 181 mg/dL (ref 87–352)
IgG (Immunoglobin G), Serum: 854 mg/dL (ref 586–1602)
IgM (Immunoglobulin M), Srm: 65 mg/dL (ref 26–217)
Total Protein ELP: 7 g/dL (ref 6.0–8.5)

## 2022-07-22 LAB — BASIC METABOLIC PANEL
Anion gap: 11 (ref 5–15)
BUN: 25 mg/dL — ABNORMAL HIGH (ref 8–23)
CO2: 22 mmol/L (ref 22–32)
Calcium: 8.2 mg/dL — ABNORMAL LOW (ref 8.9–10.3)
Chloride: 102 mmol/L (ref 98–111)
Creatinine, Ser: 1.46 mg/dL — ABNORMAL HIGH (ref 0.44–1.00)
GFR, Estimated: 40 mL/min — ABNORMAL LOW (ref >60)
Glucose, Bld: 213 mg/dL — ABNORMAL HIGH (ref 70–99)
Potassium: 3.9 mmol/L (ref 3.5–5.1)
Sodium: 135 mmol/L (ref 135–145)

## 2022-07-22 LAB — CBC WITH DIFFERENTIAL/PLATELET
Abs Immature Granulocytes: 0.02 10*3/uL (ref 0.00–0.07)
Basophils Absolute: 0 10*3/uL (ref 0.0–0.1)
Basophils Relative: 0 %
Eosinophils Absolute: 0.1 10*3/uL (ref 0.0–0.5)
Eosinophils Relative: 2 %
HCT: 39.3 % (ref 36.0–46.0)
Hemoglobin: 12.9 g/dL (ref 12.0–15.0)
Immature Granulocytes: 0 %
Lymphocytes Relative: 30 %
Lymphs Abs: 2.1 10*3/uL (ref 0.7–4.0)
MCH: 28.4 pg (ref 26.0–34.0)
MCHC: 32.8 g/dL (ref 30.0–36.0)
MCV: 86.6 fL (ref 80.0–100.0)
Monocytes Absolute: 0.6 10*3/uL (ref 0.1–1.0)
Monocytes Relative: 8 %
Neutro Abs: 4.3 10*3/uL (ref 1.7–7.7)
Neutrophils Relative %: 60 %
Platelets: 141 10*3/uL — ABNORMAL LOW (ref 150–400)
RBC: 4.54 MIL/uL (ref 3.87–5.11)
RDW: 13.9 % (ref 11.5–15.5)
WBC: 7.2 10*3/uL (ref 4.0–10.5)
nRBC: 0 % (ref 0.0–0.2)

## 2022-07-22 LAB — TROPONIN I (HIGH SENSITIVITY)
Troponin I (High Sensitivity): 6 ng/L (ref <18)
Troponin I (High Sensitivity): 6 ng/L (ref <18)

## 2022-07-22 LAB — COMPREHENSIVE METABOLIC PANEL
ALT: 21 U/L (ref 0–44)
AST: 24 U/L (ref 15–41)
Albumin: 4 g/dL (ref 3.5–5.0)
Alkaline Phosphatase: 77 U/L (ref 38–126)
Anion gap: 11 (ref 5–15)
BUN: 22 mg/dL (ref 8–23)
CO2: 22 mmol/L (ref 22–32)
Calcium: 8.7 mg/dL — ABNORMAL LOW (ref 8.9–10.3)
Chloride: 106 mmol/L (ref 98–111)
Creatinine, Ser: 1.14 mg/dL — ABNORMAL HIGH (ref 0.44–1.00)
GFR, Estimated: 53 mL/min — ABNORMAL LOW (ref >60)
Glucose, Bld: 122 mg/dL — ABNORMAL HIGH (ref 70–99)
Potassium: 4.1 mmol/L (ref 3.5–5.1)
Sodium: 139 mmol/L (ref 135–145)
Total Bilirubin: 0.4 mg/dL (ref 0.3–1.2)
Total Protein: 7.2 g/dL (ref 6.5–8.1)

## 2022-07-22 LAB — MAGNESIUM
Magnesium: 1.1 mg/dL — ABNORMAL LOW (ref 1.7–2.4)
Magnesium: 4 mg/dL — ABNORMAL HIGH (ref 1.7–2.4)
Magnesium: 3.3 mg/dL — ABNORMAL HIGH (ref 1.7–2.4)

## 2022-07-22 LAB — CK: Total CK: 29 U/L — ABNORMAL LOW (ref 38–234)

## 2022-07-22 LAB — TSH: TSH: 1.45 u[IU]/mL (ref 0.350–4.500)

## 2022-07-22 LAB — BRAIN NATRIURETIC PEPTIDE: B Natriuretic Peptide: 700.9 pg/mL — ABNORMAL HIGH (ref 0.0–100.0)

## 2022-07-22 MED ORDER — MAGNESIUM SULFATE 4 GM/100ML IV SOLN
4.0000 g | Freq: Once | INTRAVENOUS | Status: AC
  Administered 2022-07-22: 4 g via INTRAVENOUS
  Filled 2022-07-22: qty 100

## 2022-07-22 MED ORDER — SODIUM CHLORIDE 0.9 % IV SOLN
Freq: Once | INTRAVENOUS | Status: AC
  Filled 2022-07-22: qty 250

## 2022-07-22 MED ORDER — POLYMYXIN B-TRIMETHOPRIM 10000-0.1 UNIT/ML-% OP SOLN: 2.0000 [drp] | Freq: Four times a day (QID) | OPHTHALMIC | 0 refills | Status: AC

## 2022-07-22 MED ORDER — SODIUM CHLORIDE 0.9% FLUSH
10.0000 mL | Freq: Once | INTRAVENOUS | Status: AC
  Administered 2022-07-22: 10 mL via INTRAVENOUS
  Filled 2022-07-22: qty 10

## 2022-07-22 MED ORDER — POTASSIUM CHLORIDE CRYS ER 20 MEQ PO TBCR: 20.0000 meq | EXTENDED_RELEASE_TABLET | Freq: Every day | ORAL | 0 refills | Status: DC

## 2022-07-22 MED ORDER — SODIUM CHLORIDE 0.9 % IV SOLN: 6.0000 g | Freq: Once | INTRAVENOUS | Status: DC

## 2022-07-22 MED ORDER — FUROSEMIDE 40 MG PO TABS: 20.0000 mg | ORAL_TABLET | Freq: Every day | ORAL | Status: AC

## 2022-07-22 MED ORDER — SODIUM CHLORIDE 0.9 % IV BOLUS
1000.0000 mL | Freq: Once | INTRAVENOUS | Status: AC
  Administered 2022-07-22: 1000 mL via INTRAVENOUS

## 2022-07-22 MED ORDER — CALCIUM GLUCONATE-NACL 1-0.675 GM/50ML-% IV SOLN
1.0000 g | Freq: Once | INTRAVENOUS | Status: AC
  Administered 2022-07-22: 1000 mg via INTRAVENOUS
  Filled 2022-07-22: qty 50

## 2022-07-22 MED ORDER — HEPARIN SOD (PORK) LOCK FLUSH 100 UNIT/ML IV SOLN
500.0000 [IU] | Freq: Once | INTRAVENOUS | Status: AC
  Administered 2022-07-22: 500 [IU] via INTRAVENOUS
  Filled 2022-07-22: qty 5

## 2022-07-22 MED ORDER — MAGNESIUM SULFATE 2 GM/50ML IV SOLN
2.0000 g | Freq: Once | INTRAVENOUS | Status: AC
  Administered 2022-07-22: 2 g via INTRAVENOUS
  Filled 2022-07-22: qty 50

## 2022-07-22 NOTE — ED Provider Notes (Signed)
[facility] Provider Note    Event Date/Time   First MD Initiated Contact with Patient 07/22/22 1503     (approximate)   History   Bradycardia   HPI  Sarah [name] is a 67 y.o. female with a history of multiple myeloma as well as chronic hypomagnesemia presents to the ER for evaluation of "feeling like a zombie.  "Patient was in outpatient infusion center today for magnesium infusion..  States that she has been feeling unwell for the past few weeks there is no significant change today.  States that she has been having issues with irregular heartbeat with plan for possible pacer placement.  Denies any fevers or chills.  No abdominal pain or vomiting..  No numbness or tingling.     Physical Exam   Triage Vital Signs: ED Triage Vitals  Enc Vitals Group     BP 07/22/22 1330 (!) 132/59     Pulse Rate 07/22/22 1330 (!) 34     Resp 07/22/22 1330 18     Temp 07/22/22 1330 98.9 F (37.2 C)     Temp Source 07/22/22 1330 Oral     SpO2 07/22/22 1330 97 %     Weight --      Height --      Head Circumference --      Peak Flow --      Pain Score 07/22/22 1329 3     Pain Loc --      Pain Edu? --      Excl. in [place]? --     Most recent vital signs: Vitals:   07/22/22 1500 07/22/22 1530  BP: 118/86 103/81  Pulse: 87 (!) 39  Resp: 18 (!) 25  Temp:    SpO2: 100% 98%     Constitutional: Alert  Eyes: Conjunctivae are normal.  Head: Atraumatic. Nose: No congestion/rhinnorhea. Mouth/Throat: Mucous membranes are moist.   Neck: Painless ROM.  Cardiovascular:   Good peripheral circulation. Irregularly irregular hr Respiratory: Normal respiratory effort.  No retractions.  Gastrointestinal: Soft and nontender.  Musculoskeletal:  no deformity Neurologic:  MAE spontaneously. No gross focal neurologic deficits are appreciated. hyporeflexic Skin:  Skin is warm, dry and intact. No rash noted. Psychiatric: Mood and affect are normal. Speech and behavior are  normal.    ED Results / Procedures / Treatments   Labs (all labs ordered are listed, but only abnormal results are displayed) Labs Reviewed  CBC WITH DIFFERENTIAL/PLATELET - Abnormal; Notable for the following components:      Result Value   Platelets 141 (*)    All other components within normal limits  COMPREHENSIVE METABOLIC PANEL - Abnormal; Notable for the following components:   Glucose, Bld 122 (*)    Creatinine, Ser 1.14 (*)    Calcium 8.7 (*)    GFR, Estimated 53 (*)    All other components within normal limits  BRAIN NATRIURETIC PEPTIDE - Abnormal; Notable for the following components:   B Natriuretic Peptide 700.9 (*)    All other components within normal limits  MAGNESIUM - Abnormal; Notable for the following components:   Magnesium 4.0 (*)    All other components within normal limits  TSH  CK  TROPONIN I (HIGH SENSITIVITY)  TROPONIN I (HIGH SENSITIVITY)     EKG  ED ECG REPORT I, Merlyn Lot, the attending physician, personally viewed and interpreted this ECG.   Date: 07/22/2022  EKG Time: 13:31  Rate: 75  Rhythm: sinus with frequent pvc  Axis:  left  Intervals: prolonged qt  ST&T Change: no stemi, no depression    RADIOLOGY Please see ED Course for my review and interpretation.  I personally reviewed all radiographic images ordered to evaluate for the above acute complaints and reviewed radiology reports and findings.  These findings were personally discussed with the patient.  Please see medical record for radiology report.    PROCEDURES:  Critical Care performed: No  Procedures   MEDICATIONS ORDERED IN ED: Medications  calcium gluconate 1 g/ 50 mL sodium chloride IVPB (1,000 mg Intravenous New Bag/Given 07/22/22 1531)  sodium chloride 0.9 % bolus 1,000 mL (1,000 mLs Intravenous New Bag/Given 07/22/22 1530)     IMPRESSION / MDM / ASSESSMENT AND PLAN / ED COURSE  I reviewed the triage vital signs and the nursing notes.                               Differential diagnosis includes, but is not limited to, symptomatic bradycardia, dysrhythmia, electrolyte abnormality, dehydration, CHF, sepsis  Patient presenting to the ER for evaluation of symptoms as described above.  Based on symptoms, risk factors and considered above differential, this presenting complaint could reflect a potentially life-threatening illness therefore the patient will be placed on continuous pulse oximetry and telemetry for monitoring.  Laboratory evaluation will be sent to evaluate for the above complaints.  Patient has critically elevated magnesium to 4 after IV mag infusion of 6 g today.  EKG shows sinus rhythm with frequent PVCs.  She does feel weak and tired.  She is not in any respiratory distress.  Blood pressure is stable.  Will give IV fluids as well as calcium as she does have borderline prolonged QT.  She does not have any DTRs.  She not complaining of any nausea.  Will consult hospitalist for admission.      FINAL CLINICAL IMPRESSION(S) / ED DIAGNOSES   Final diagnoses:  Hypermagnesemia     Rx / DC Orders   ED Discharge Orders     None        Note:  This document was prepared using Dragon voice recognition software and may include unintentional dictation errors.    Merlyn Lot, MD 07/22/22 [id number]

## 2022-07-22 NOTE — ED Triage Notes (Addendum)
Pt to Ed via cancer center. Pt was seen for 6g mag infusion due to chronic hypomagnesium. Staff noticed pt's HR was in the 30-40s and pt was endorsing HA. Pt is suppose to get a pacemaker soon but unsure of date. Pt with hx CHF and cardiomyopathy. Mg level 1.1 in office. BP 106/60. Pt on RA.

## 2022-07-22 NOTE — ED Provider Triage Note (Signed)
Emergency Medicine Provider Triage Evaluation Note  Sarah [name] , a 67 y.o. female  was evaluated in triage.  Pt complains of bradycardia. Patient reports she was getting a magnesium infusion and felt "like a zombie." She reports her heart feels "fluttery" but denies CP. Supposed to get a pacemaker  Review of Systems  Positive: Palpitations, dizzy Negative: Cp/sob  Physical Exam  There were no vitals taken for this visit. Gen:   Awake, no distress   Resp:  Normal effort  MSK:   Moves extremities without difficulty  Other:    Medical Decision Making  Medically screening exam initiated at 1:28 PM.  Appropriate orders placed.  Sarah [name] was informed that the remainder of the evaluation will be completed by another provider, this initial triage assessment does not replace that evaluation, and the importance of remaining in the ED until their evaluation is complete.     Marquette Old, PA-C 07/22/22 1330

## 2022-07-22 NOTE — ED Notes (Signed)
Pt transported to lobby via wheelchair where she ambulated to chair to wait for family member to pick her up.  Pt in NAD, a&ox4, room air.

## 2022-07-22 NOTE — ED Notes (Addendum)
This RN at bedside, pt is able to transfer from wheelchair to stretcher without assistance.  She is A&Ox4, on room air at 100%.  Pt in NAD.  Placed on monitor, HR at 74bpm.

## 2022-07-22 NOTE — Consult Note (Signed)
Hospitalist Consult Note   Sarah [name]  female DOB: May 26, 1956  HGD:[id number]  PCP: Glean Hess, MD  Admit date: 07/22/2022 Discharge date: 07/22/2022   Discharge Instructions     Discharge instructions   Complete by: As directed    Your magnesium recheck is 3.3.  Please hold your home magnesium supplement for 2 days and then resume taking.   Dr. Enzo Bi [facility] Course:  For full details, please see H&P, progress notes, consult notes and ancillary notes.  Briefly,  HPI: Sarah [name] is a 67 y.o. female with medical history significant of systolic CHF, HTN, DM2, chronic hypomag who was sent from cancer center for bradycardia.   Pt received IV mag 6g in the cancer center this morning and was reportedly found to have HR in 30's-40's, so was sent to the ED.  While in the ED, vitals were stable.  Workup only remarkable for mag of 4.0 (was 1.1 this morning before IV mag infusion).  ED provider requested hospitalist to admit for observation.  Pt was seen walking around in the ED room and reported feeling tired.  Only complaint was she felt she had developed a pink eye in the left eye with yellow sticky drainage in the morning.    Discussed with oncall cardiology Dr. 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  Pt follows with cardiology at [facility].    Both Dr. Fletcher Anon and I believed mag 4.0 should not be an issue and not a reason for admission.  It's also likely that the mag level was falsely elevated since it was checked soon after pt received the 6g infusion and level hadn't equilibrated.  I rechecked mag level and it dropped from 4.0 to 3.3 in just after 2 hours.    I discussed the above with pt and believe pt can be discharged from the ED.  Pt agreed.   Discharge Diagnoses:  Active Problems:   Hypomagnesemia   Benign essential HTN   Hyperlipidemia associated with type 2 diabetes mellitus  (HCC)   Chronic systolic CHF (congestive heart failure) (HCC)   Anemia of chronic renal failure, stage 3a (HCC)   Bacterial conjunctivitis of left eye     Discharge Instructions:  Allergies as of 07/22/2022       Reactions   Oxycodone-acetaminophen Anaphylaxis   Swelling and rash   Celebrex [celecoxib] Diarrhea   Codeine    Plerixafor    In 2016 during ASCT collection patient developed fever to 103.15F and required hospitalization   Benadryl [diphenhydramine] Palpitations   Morphine Itching, Rash   Ondansetron Diarrhea   Tylenol [acetaminophen] Itching, Rash        Medication List     STOP taking these medications    acyclovir 400 MG tablet Commonly known as: ZOVIRAX   nitroGLYCERIN 0.4 MG SL tablet Commonly known as: NITROSTAT   prochlorperazine 25 MG suppository Commonly known as: COMPAZINE   traMADol 50 MG tablet Commonly known as: ULTRAM       TAKE these medications    allopurinol 100 MG tablet Commonly known as: ZYLOPRIM TAKE 1 TABLET(100 MG) BY MOUTH DAILY as needed   atorvastatin 10 MG tablet Commonly known as: Lipitor Take 0.5 tablets (5 mg total) by mouth daily.   bisoprolol 10 MG tablet Commonly known as: ZEBETA Take 1 tablet (10 mg total) by mouth daily.   calcium carbonate 1250 (500 Ca) MG chewable  tablet Commonly known as: OS-CAL Chew 2 tablets by mouth daily.   diclofenac sodium 1 % Gel Commonly known as: VOLTAREN Apply 2 g topically 4 (four) times daily.   diphenoxylate-atropine 2.5-0.025 MG tablet Commonly known as: LOMOTIL Take 2 tablets by mouth 4 (four) times daily as needed for diarrhea or loose stools.   DULoxetine 60 MG capsule Commonly known as: CYMBALTA TAKE 1 CAPSULE(60 MG) BY MOUTH DAILY   empagliflozin 10 MG Tabs tablet Commonly known as: JARDIANCE Take 1 tablet by mouth daily.   Entresto 24-26 MG Generic drug: sacubitril-valsartan Take 1 tablet by mouth 2 (two) times daily.   FLUoxetine 40 MG  capsule Commonly known as: PROZAC TAKE 1 CAPSULE EVERY DAY   furosemide 40 MG tablet Commonly known as: LASIX Take 0.5 tablets (20 mg total) by mouth daily. Home dose. What changed:  how much to take additional instructions   glipiZIDE 2.5 MG 24 hr tablet Commonly known as: GLUCOTROL XL Take 1 tablet (2.5 mg total) by mouth daily with breakfast.   glucose blood test strip   guaiFENesin-dextromethorphan 100-10 MG/5ML syrup Commonly known as: ROBITUSSIN DM Take 5 mLs by mouth every 4 (four) hours as needed for cough.   lidocaine 2 % solution Commonly known as: XYLOCAINE Use as directed 15 mLs in the mouth or throat every 3 (three) hours as needed for mouth pain (swish and spit).   magnesium chloride 64 MG Tbec SR tablet Commonly known as: SLOW-MAG Take 1 tablet (64 mg total) by mouth daily.   montelukast 10 MG tablet Commonly known as: Singulair Take 1 tablet (10 mg total) by mouth See admin instructions. Take 1 tab daily for 2 days after daratumumab treatments   multivitamin tablet Take 1 tablet by mouth daily.   omeprazole 40 MG capsule Commonly known as: PRILOSEC TAKE 1 CAPSULE IN THE MORNING AND TAKE 1 CAPSULE AT BEDTIME   pentoxifylline 400 MG CR tablet Commonly known as: TRENTAL Take 400 mg by mouth 3 (three) times daily with meals.   potassium chloride SA 20 MEQ tablet Commonly known as: KLOR-CON M Take 1 tablet (20 mEq total) by mouth daily. Home med, reduced from twice daily to daily. What changed:  when to take this additional instructions   promethazine 25 MG tablet Commonly known as: PHENERGAN Take 1 tablet (25 mg total) by mouth every 6 (six) hours as needed for nausea or vomiting. What changed: Another medication with the same name was removed. Continue taking this medication, and follow the directions you see here.   tiZANidine 4 MG tablet Commonly known as: Zanaflex Take 1 tablet (4 mg total) by mouth every 6 (six) hours as needed for muscle  spasms.   traZODone 100 MG tablet Commonly known as: DESYREL TAKE 1 TABLET AT BEDTIME   trimethoprim-polymyxin b ophthalmic solution Commonly known as: POLYTRIM Place 2 drops into the left eye 4 (four) times daily for 5 days.   vitamin E 45 MG (100 UNITS) capsule Take by mouth daily.          Allergies  Allergen Reactions   Oxycodone-Acetaminophen Anaphylaxis    Swelling and rash   Celebrex [Celecoxib] Diarrhea   Codeine    Plerixafor     In 2016 during ASCT collection patient developed fever to 103.62F and required hospitalization   Benadryl [Diphenhydramine] Palpitations   Morphine Itching and Rash   Ondansetron Diarrhea   Tylenol [Acetaminophen] Itching and Rash     The results of significant diagnostics from this hospitalization (  including imaging, microbiology, ancillary and laboratory) are listed below for reference.   Consultations:   Procedures/Studies: DG Chest 2 View  Result Date: 07/22/2022 CLINICAL DATA:  Chest pain EXAM: CHEST - 2 VIEW COMPARISON:  04/08/2022 FINDINGS: Right-sided chest port remains in place. Normal heart size. No focal airspace consolidation, pleural effusion, or pneumothorax. IMPRESSION: No active cardiopulmonary disease. Electronically Signed   By: Davina Poke D.O.   On: 07/22/2022 14:20      Labs: BNP (last 3 results) Recent Labs    02/04/22 1007 03/25/22 2129 07/22/22 1332  BNP 458.1* 675.9* 094.0*   Basic Metabolic Panel: Recent Labs  Lab 07/22/22 0842 07/22/22 1332 07/22/22 1536  NA 135 139  --   K 3.9 4.1  --   CL 102 106  --   CO2 22 22  --   GLUCOSE 213* 122*  --   BUN 25* 22  --   CREATININE 1.46* 1.14*  --   CALCIUM 8.2* 8.7*  --   MG 1.1* 4.0* 3.3*   Liver Function Tests: Recent Labs  Lab 07/22/22 1332  AST 24  ALT 21  ALKPHOS 77  BILITOT 0.4  PROT 7.2  ALBUMIN 4.0   No results for input(s): "LIPASE", "AMYLASE" in the last 168 hours. No results for input(s): "AMMONIA" in the last 168  hours. CBC: Recent Labs  Lab 07/22/22 1332  WBC 7.2  NEUTROABS 4.3  HGB 12.9  HCT 39.3  MCV 86.6  PLT 141*   Cardiac Enzymes: Recent Labs  Lab 07/22/22 1536  CKTOTAL 29*   BNP: Invalid input(s): "POCBNP" CBG: No results for input(s): "GLUCAP" in the last 168 hours. D-Dimer No results for input(s): "DDIMER" in the last 72 hours. Hgb A1c No results for input(s): "HGBA1C" in the last 72 hours. Lipid Profile No results for input(s): "CHOL", "HDL", "LDLCALC", "TRIG", "CHOLHDL", "LDLDIRECT" in the last 72 hours. Thyroid function studies Recent Labs    07/22/22 1332  TSH 1.450   Anemia work up No results for input(s): "VITAMINB12", "FOLATE", "FERRITIN", "TIBC", "IRON", "RETICCTPCT" in the last 72 hours. Urinalysis    Component Value Date/Time   COLORURINE YELLOW (A) 08/24/2021 2049   APPEARANCEUR CLEAR (A) 08/24/2021 2049   APPEARANCEUR Clear 07/13/2018 0835   LABSPEC 1.009 08/24/2021 2049   PHURINE 5.0 08/24/2021 2049   GLUCOSEU NEGATIVE 08/24/2021 2049   HGBUR NEGATIVE 08/24/2021 2049   BILIRUBINUR NEGATIVE 08/24/2021 2049   BILIRUBINUR neg 11/25/2018 1021   BILIRUBINUR Negative 07/13/2018 0835   KETONESUR 5 (A) 08/24/2021 2049   PROTEINUR NEGATIVE 08/24/2021 2049   UROBILINOGEN 0.2 11/25/2018 1021   NITRITE NEGATIVE 08/24/2021 2049   LEUKOCYTESUR NEGATIVE 08/24/2021 2049   Sepsis Labs Recent Labs  Lab 07/22/22 1332  WBC 7.2   Microbiology No results found for this or any previous visit (from the past 240 hour(s)).   Total time spend on discharging this patient, including the last patient exam, discussing the hospital stay, instructions for ongoing care as it relates to all pertinent caregivers, as well as preparing the medical discharge records, prescriptions, and/or referrals as applicable, is 60 minutes.    Enzo Bi, MD  [facility] 07/22/2022, 8:08 PM

## 2022-07-22 NOTE — Progress Notes (Signed)
Finished Mg infusion. Upon getting ready to discharge VS checked, pt found to be bradycardia, HR upper 30s to low 40s. Pt states she "feels like a zombie" since her cardiologist switched medications. Per [employer] and Dr Tasia Catchings, pt needs to go to ED. Lennox Grumbles, RN called report and transported pt via wheelchair. Port left accessed

## 2022-07-24 DIAGNOSIS — Z9481 Bone marrow transplant status: Secondary | ICD-10-CM | POA: Diagnosis not present

## 2022-07-24 DIAGNOSIS — T458X5A Adverse effect of other primarily systemic and hematological agents, initial encounter: Secondary | ICD-10-CM | POA: Diagnosis not present

## 2022-07-24 DIAGNOSIS — C9002 Multiple myeloma in relapse: Secondary | ICD-10-CM | POA: Diagnosis not present

## 2022-07-24 DIAGNOSIS — D801 Nonfamilial hypogammaglobulinemia: Secondary | ICD-10-CM | POA: Diagnosis not present

## 2022-07-24 DIAGNOSIS — M87180 Osteonecrosis due to drugs, jaw: Secondary | ICD-10-CM | POA: Diagnosis not present

## 2022-07-25 DIAGNOSIS — I493 Ventricular premature depolarization: Secondary | ICD-10-CM | POA: Diagnosis not present

## 2022-07-25 DIAGNOSIS — I350 Nonrheumatic aortic (valve) stenosis: Secondary | ICD-10-CM | POA: Diagnosis not present

## 2022-07-25 DIAGNOSIS — I5022 Chronic systolic (congestive) heart failure: Secondary | ICD-10-CM | POA: Diagnosis not present

## 2022-07-26 ENCOUNTER — Telehealth: Payer: Self-pay

## 2022-07-26 NOTE — Telephone Encounter (Signed)
        Patient  visited [facility] on 07/22/2022  for Hypermagnesemia, Bradycardia.   Telephone encounter attempt :  1st  Unable to leave message voicemail full.   [place] Resource Care Guide   [email or web address]  ?? [phone]   Website: [email or web address]  [email or web address]

## 2022-07-29 ENCOUNTER — Inpatient Hospital Stay: Payer: Medicare PPO

## 2022-07-29 ENCOUNTER — Telehealth: Payer: Self-pay

## 2022-07-29 VITALS — BP 128/73 | HR 73 | Temp 96.6°F | Resp 20

## 2022-07-29 DIAGNOSIS — I350 Nonrheumatic aortic (valve) stenosis: Secondary | ICD-10-CM | POA: Diagnosis not present

## 2022-07-29 DIAGNOSIS — D801 Nonfamilial hypogammaglobulinemia: Secondary | ICD-10-CM | POA: Diagnosis not present

## 2022-07-29 DIAGNOSIS — C9001 Multiple myeloma in remission: Secondary | ICD-10-CM | POA: Diagnosis not present

## 2022-07-29 DIAGNOSIS — E876 Hypokalemia: Secondary | ICD-10-CM

## 2022-07-29 DIAGNOSIS — C9000 Multiple myeloma not having achieved remission: Secondary | ICD-10-CM

## 2022-07-29 DIAGNOSIS — G620 Drug-induced polyneuropathy: Secondary | ICD-10-CM | POA: Diagnosis not present

## 2022-07-29 DIAGNOSIS — T451X5A Adverse effect of antineoplastic and immunosuppressive drugs, initial encounter: Secondary | ICD-10-CM | POA: Diagnosis not present

## 2022-07-29 DIAGNOSIS — R110 Nausea: Secondary | ICD-10-CM | POA: Diagnosis not present

## 2022-07-29 DIAGNOSIS — E538 Deficiency of other specified B group vitamins: Secondary | ICD-10-CM | POA: Diagnosis not present

## 2022-07-29 DIAGNOSIS — R197 Diarrhea, unspecified: Secondary | ICD-10-CM | POA: Diagnosis not present

## 2022-07-29 LAB — BASIC METABOLIC PANEL
Anion gap: 10 (ref 5–15)
BUN: 23 mg/dL (ref 8–23)
CO2: 21 mmol/L — ABNORMAL LOW (ref 22–32)
Calcium: 9.4 mg/dL (ref 8.9–10.3)
Chloride: 105 mmol/L (ref 98–111)
Creatinine, Ser: 1.2 mg/dL — ABNORMAL HIGH (ref 0.44–1.00)
GFR, Estimated: 50 mL/min — ABNORMAL LOW (ref >60)
Glucose, Bld: 155 mg/dL — ABNORMAL HIGH (ref 70–99)
Potassium: 4.3 mmol/L (ref 3.5–5.1)
Sodium: 136 mmol/L (ref 135–145)

## 2022-07-29 LAB — MAGNESIUM: Magnesium: 1.3 mg/dL — ABNORMAL LOW (ref 1.7–2.4)

## 2022-07-29 MED ORDER — SODIUM CHLORIDE 0.9% FLUSH
10.0000 mL | Freq: Once | INTRAVENOUS | Status: AC | PRN
  Administered 2022-07-29: 10 mL
  Filled 2022-07-29: qty 10

## 2022-07-29 MED ORDER — MAGNESIUM SULFATE 2 GM/50ML IV SOLN
2.0000 g | Freq: Once | INTRAVENOUS | Status: AC
  Administered 2022-07-29: 2 g via INTRAVENOUS
  Filled 2022-07-29: qty 50

## 2022-07-29 MED ORDER — HEPARIN SOD (PORK) LOCK FLUSH 100 UNIT/ML IV SOLN
500.0000 [IU] | Freq: Once | INTRAVENOUS | Status: AC | PRN
  Administered 2022-07-29: 500 [IU]
  Filled 2022-07-29: qty 5

## 2022-07-29 MED ORDER — SODIUM CHLORIDE 0.9 % IV SOLN: 4.0000 g | Freq: Once | INTRAVENOUS | Status: DC

## 2022-07-29 MED ORDER — MAGNESIUM SULFATE 4 GM/100ML IV SOLN
4.0000 g | Freq: Once | INTRAVENOUS | Status: AC
  Administered 2022-07-29: 4 g via INTRAVENOUS
  Filled 2022-07-29: qty 100

## 2022-07-29 NOTE — Telephone Encounter (Signed)
     Patient  visit on 07/22/2022  at [facility] was for Hypermagnesemia, Bradycardia.  Have you been able to follow up with your primary care physician? Patient followed up with her Cardiologist.  The patient was or was not able to obtain any needed medicine or equipment. Patient obtained medication.  Are there diet recommendations that you are having difficulty following? No  Patient expresses understanding of discharge instructions and education provided has no other needs at this time.    [place] Resource Care Guide   [email or web address]  ?? [phone]   Website: [email or web address]  [email or web address]

## 2022-08-01 DIAGNOSIS — M879 Osteonecrosis, unspecified: Secondary | ICD-10-CM | POA: Diagnosis not present

## 2022-08-02 DIAGNOSIS — I5022 Chronic systolic (congestive) heart failure: Secondary | ICD-10-CM | POA: Diagnosis not present

## 2022-08-02 DIAGNOSIS — Z9889 Other specified postprocedural states: Secondary | ICD-10-CM | POA: Diagnosis not present

## 2022-08-02 DIAGNOSIS — I493 Ventricular premature depolarization: Secondary | ICD-10-CM | POA: Diagnosis not present

## 2022-08-02 DIAGNOSIS — I350 Nonrheumatic aortic (valve) stenosis: Secondary | ICD-10-CM | POA: Diagnosis not present

## 2022-08-05 ENCOUNTER — Inpatient Hospital Stay: Payer: Medicare PPO

## 2022-08-05 ENCOUNTER — Inpatient Hospital Stay: Payer: Medicare PPO | Attending: Oncology

## 2022-08-05 VITALS — BP 130/78 | HR 69 | Temp 98.1°F | Resp 18

## 2022-08-05 DIAGNOSIS — E1122 Type 2 diabetes mellitus with diabetic chronic kidney disease: Secondary | ICD-10-CM | POA: Diagnosis not present

## 2022-08-05 DIAGNOSIS — Z87891 Personal history of nicotine dependence: Secondary | ICD-10-CM | POA: Diagnosis not present

## 2022-08-05 DIAGNOSIS — T451X5A Adverse effect of antineoplastic and immunosuppressive drugs, initial encounter: Secondary | ICD-10-CM | POA: Diagnosis not present

## 2022-08-05 DIAGNOSIS — Z832 Family history of diseases of the blood and blood-forming organs and certain disorders involving the immune mechanism: Secondary | ICD-10-CM | POA: Insufficient documentation

## 2022-08-05 DIAGNOSIS — Z90710 Acquired absence of both cervix and uterus: Secondary | ICD-10-CM | POA: Insufficient documentation

## 2022-08-05 DIAGNOSIS — G620 Drug-induced polyneuropathy: Secondary | ICD-10-CM | POA: Insufficient documentation

## 2022-08-05 DIAGNOSIS — Z818 Family history of other mental and behavioral disorders: Secondary | ICD-10-CM | POA: Insufficient documentation

## 2022-08-05 DIAGNOSIS — E538 Deficiency of other specified B group vitamins: Secondary | ICD-10-CM | POA: Insufficient documentation

## 2022-08-05 DIAGNOSIS — I5032 Chronic diastolic (congestive) heart failure: Secondary | ICD-10-CM | POA: Diagnosis not present

## 2022-08-05 DIAGNOSIS — Z888 Allergy status to other drugs, medicaments and biological substances status: Secondary | ICD-10-CM | POA: Diagnosis not present

## 2022-08-05 DIAGNOSIS — Z885 Allergy status to narcotic agent status: Secondary | ICD-10-CM | POA: Diagnosis not present

## 2022-08-05 DIAGNOSIS — D801 Nonfamilial hypogammaglobulinemia: Secondary | ICD-10-CM | POA: Insufficient documentation

## 2022-08-05 DIAGNOSIS — Z808 Family history of malignant neoplasm of other organs or systems: Secondary | ICD-10-CM | POA: Insufficient documentation

## 2022-08-05 DIAGNOSIS — M2550 Pain in unspecified joint: Secondary | ICD-10-CM | POA: Insufficient documentation

## 2022-08-05 DIAGNOSIS — I428 Other cardiomyopathies: Secondary | ICD-10-CM

## 2022-08-05 DIAGNOSIS — Z9049 Acquired absence of other specified parts of digestive tract: Secondary | ICD-10-CM | POA: Diagnosis not present

## 2022-08-05 DIAGNOSIS — Z800 Family history of malignant neoplasm of digestive organs: Secondary | ICD-10-CM | POA: Insufficient documentation

## 2022-08-05 DIAGNOSIS — F32A Depression, unspecified: Secondary | ICD-10-CM | POA: Insufficient documentation

## 2022-08-05 DIAGNOSIS — M549 Dorsalgia, unspecified: Secondary | ICD-10-CM | POA: Insufficient documentation

## 2022-08-05 DIAGNOSIS — C9000 Multiple myeloma not having achieved remission: Secondary | ICD-10-CM

## 2022-08-05 DIAGNOSIS — Z803 Family history of malignant neoplasm of breast: Secondary | ICD-10-CM | POA: Insufficient documentation

## 2022-08-05 DIAGNOSIS — Z9481 Bone marrow transplant status: Secondary | ICD-10-CM | POA: Insufficient documentation

## 2022-08-05 DIAGNOSIS — Z95828 Presence of other vascular implants and grafts: Secondary | ICD-10-CM

## 2022-08-05 DIAGNOSIS — K219 Gastro-esophageal reflux disease without esophagitis: Secondary | ICD-10-CM | POA: Diagnosis not present

## 2022-08-05 DIAGNOSIS — Z841 Family history of disorders of kidney and ureter: Secondary | ICD-10-CM | POA: Insufficient documentation

## 2022-08-05 DIAGNOSIS — Z822 Family history of deafness and hearing loss: Secondary | ICD-10-CM | POA: Insufficient documentation

## 2022-08-05 DIAGNOSIS — Z833 Family history of diabetes mellitus: Secondary | ICD-10-CM | POA: Insufficient documentation

## 2022-08-05 DIAGNOSIS — Z8249 Family history of ischemic heart disease and other diseases of the circulatory system: Secondary | ICD-10-CM | POA: Insufficient documentation

## 2022-08-05 DIAGNOSIS — Z9484 Stem cells transplant status: Secondary | ICD-10-CM | POA: Insufficient documentation

## 2022-08-05 DIAGNOSIS — R0981 Nasal congestion: Secondary | ICD-10-CM | POA: Insufficient documentation

## 2022-08-05 DIAGNOSIS — I350 Nonrheumatic aortic (valve) stenosis: Secondary | ICD-10-CM | POA: Diagnosis not present

## 2022-08-05 DIAGNOSIS — D500 Iron deficiency anemia secondary to blood loss (chronic): Secondary | ICD-10-CM | POA: Insufficient documentation

## 2022-08-05 DIAGNOSIS — R197 Diarrhea, unspecified: Secondary | ICD-10-CM | POA: Insufficient documentation

## 2022-08-05 DIAGNOSIS — N1832 Chronic kidney disease, stage 3b: Secondary | ICD-10-CM | POA: Insufficient documentation

## 2022-08-05 DIAGNOSIS — Z8379 Family history of other diseases of the digestive system: Secondary | ICD-10-CM | POA: Insufficient documentation

## 2022-08-05 DIAGNOSIS — Z886 Allergy status to analgesic agent status: Secondary | ICD-10-CM | POA: Insufficient documentation

## 2022-08-05 DIAGNOSIS — Z79899 Other long term (current) drug therapy: Secondary | ICD-10-CM | POA: Insufficient documentation

## 2022-08-05 DIAGNOSIS — C9001 Multiple myeloma in remission: Secondary | ICD-10-CM | POA: Diagnosis not present

## 2022-08-05 DIAGNOSIS — I130 Hypertensive heart and chronic kidney disease with heart failure and stage 1 through stage 4 chronic kidney disease, or unspecified chronic kidney disease: Secondary | ICD-10-CM | POA: Diagnosis not present

## 2022-08-05 DIAGNOSIS — Z801 Family history of malignant neoplasm of trachea, bronchus and lung: Secondary | ICD-10-CM | POA: Insufficient documentation

## 2022-08-05 LAB — MAGNESIUM: Magnesium: 1.2 mg/dL — ABNORMAL LOW (ref 1.7–2.4)

## 2022-08-05 MED ORDER — SODIUM CHLORIDE 0.9 % IV SOLN
Freq: Once | INTRAVENOUS | Status: AC
  Filled 2022-08-05: qty 250

## 2022-08-05 MED ORDER — MAGNESIUM SULFATE 2 GM/50ML IV SOLN
2.0000 g | Freq: Once | INTRAVENOUS | Status: AC
  Administered 2022-08-05: 2 g via INTRAVENOUS
  Filled 2022-08-05: qty 50

## 2022-08-05 MED ORDER — HEPARIN SOD (PORK) LOCK FLUSH 100 UNIT/ML IV SOLN
500.0000 [IU] | Freq: Once | INTRAVENOUS | Status: AC
  Administered 2022-08-05: 500 [IU]
  Filled 2022-08-05: qty 5

## 2022-08-05 MED ORDER — SODIUM CHLORIDE 0.9 % IV SOLN: 4.0000 g | Freq: Once | INTRAVENOUS | Status: DC

## 2022-08-05 MED ORDER — SODIUM CHLORIDE 0.9% FLUSH
10.0000 mL | Freq: Once | INTRAVENOUS | Status: AC
  Administered 2022-08-05: 10 mL via INTRAVENOUS
  Filled 2022-08-05: qty 10

## 2022-08-05 MED ORDER — MAGNESIUM SULFATE 4 GM/100ML IV SOLN
4.0000 g | Freq: Once | INTRAVENOUS | Status: AC
  Administered 2022-08-05: 4 g via INTRAVENOUS
  Filled 2022-08-05: qty 100

## 2022-08-05 MED ORDER — SODIUM CHLORIDE 0.9 % IV SOLN
25.0000 mg | Freq: Once | INTRAVENOUS | Status: AC
  Administered 2022-08-05: 25 mg via INTRAVENOUS
  Filled 2022-08-05: qty 1

## 2022-08-07 DIAGNOSIS — I350 Nonrheumatic aortic (valve) stenosis: Secondary | ICD-10-CM | POA: Diagnosis not present

## 2022-08-07 DIAGNOSIS — I493 Ventricular premature depolarization: Secondary | ICD-10-CM | POA: Diagnosis not present

## 2022-08-12 ENCOUNTER — Inpatient Hospital Stay (HOSPITAL_BASED_OUTPATIENT_CLINIC_OR_DEPARTMENT_OTHER): Payer: Medicare PPO | Admitting: Oncology

## 2022-08-12 ENCOUNTER — Encounter: Payer: Self-pay | Admitting: Oncology

## 2022-08-12 ENCOUNTER — Inpatient Hospital Stay: Payer: Medicare PPO

## 2022-08-12 VITALS — BP 128/62 | HR 58 | Temp 97.6°F | Resp 16 | Wt 156.8 lb

## 2022-08-12 DIAGNOSIS — Z7682 Awaiting organ transplant status: Secondary | ICD-10-CM | POA: Diagnosis not present

## 2022-08-12 DIAGNOSIS — T451X5A Adverse effect of antineoplastic and immunosuppressive drugs, initial encounter: Secondary | ICD-10-CM | POA: Diagnosis not present

## 2022-08-12 DIAGNOSIS — C9001 Multiple myeloma in remission: Secondary | ICD-10-CM

## 2022-08-12 DIAGNOSIS — E538 Deficiency of other specified B group vitamins: Secondary | ICD-10-CM

## 2022-08-12 DIAGNOSIS — D500 Iron deficiency anemia secondary to blood loss (chronic): Secondary | ICD-10-CM | POA: Diagnosis not present

## 2022-08-12 DIAGNOSIS — C9000 Multiple myeloma not having achieved remission: Secondary | ICD-10-CM

## 2022-08-12 DIAGNOSIS — G629 Polyneuropathy, unspecified: Secondary | ICD-10-CM | POA: Diagnosis not present

## 2022-08-12 DIAGNOSIS — G620 Drug-induced polyneuropathy: Secondary | ICD-10-CM | POA: Diagnosis not present

## 2022-08-12 DIAGNOSIS — F32A Depression, unspecified: Secondary | ICD-10-CM | POA: Diagnosis not present

## 2022-08-12 DIAGNOSIS — I350 Nonrheumatic aortic (valve) stenosis: Secondary | ICD-10-CM | POA: Diagnosis not present

## 2022-08-12 DIAGNOSIS — R197 Diarrhea, unspecified: Secondary | ICD-10-CM | POA: Diagnosis not present

## 2022-08-12 LAB — CBC WITH DIFFERENTIAL/PLATELET
Abs Immature Granulocytes: 0.02 10*3/uL (ref 0.00–0.07)
Basophils Absolute: 0 10*3/uL (ref 0.0–0.1)
Basophils Relative: 1 %
Eosinophils Absolute: 0.1 10*3/uL (ref 0.0–0.5)
Eosinophils Relative: 3 %
HCT: 34.5 % — ABNORMAL LOW (ref 36.0–46.0)
Hemoglobin: 11.4 g/dL — ABNORMAL LOW (ref 12.0–15.0)
Immature Granulocytes: 0 %
Lymphocytes Relative: 24 %
Lymphs Abs: 1.1 10*3/uL (ref 0.7–4.0)
MCH: 29.2 pg (ref 26.0–34.0)
MCHC: 33 g/dL (ref 30.0–36.0)
MCV: 88.2 fL (ref 80.0–100.0)
Monocytes Absolute: 0.3 10*3/uL (ref 0.1–1.0)
Monocytes Relative: 7 %
Neutro Abs: 3 10*3/uL (ref 1.7–7.7)
Neutrophils Relative %: 65 %
Platelets: 111 10*3/uL — ABNORMAL LOW (ref 150–400)
RBC: 3.91 MIL/uL (ref 3.87–5.11)
RDW: 13.4 % (ref 11.5–15.5)
WBC: 4.6 10*3/uL (ref 4.0–10.5)
nRBC: 0 % (ref 0.0–0.2)

## 2022-08-12 LAB — COMPREHENSIVE METABOLIC PANEL
ALT: 22 U/L (ref 0–44)
AST: 29 U/L (ref 15–41)
Albumin: 3.9 g/dL (ref 3.5–5.0)
Alkaline Phosphatase: 90 U/L (ref 38–126)
Anion gap: 9 (ref 5–15)
BUN: 31 mg/dL — ABNORMAL HIGH (ref 8–23)
CO2: 24 mmol/L (ref 22–32)
Calcium: 8.4 mg/dL — ABNORMAL LOW (ref 8.9–10.3)
Chloride: 103 mmol/L (ref 98–111)
Creatinine, Ser: 1.19 mg/dL — ABNORMAL HIGH (ref 0.44–1.00)
GFR, Estimated: 51 mL/min — ABNORMAL LOW (ref >60)
Glucose, Bld: 179 mg/dL — ABNORMAL HIGH (ref 70–99)
Potassium: 4.3 mmol/L (ref 3.5–5.1)
Sodium: 136 mmol/L (ref 135–145)
Total Bilirubin: 0.4 mg/dL (ref 0.3–1.2)
Total Protein: 6.9 g/dL (ref 6.5–8.1)

## 2022-08-12 LAB — VITAMIN B12: Vitamin B-12: 375 pg/mL (ref 180–914)

## 2022-08-12 LAB — MAGNESIUM: Magnesium: 1.3 mg/dL — ABNORMAL LOW (ref 1.7–2.4)

## 2022-08-12 MED ORDER — MAGNESIUM SULFATE 2 GM/50ML IV SOLN
2.0000 g | Freq: Once | INTRAVENOUS | Status: AC
  Administered 2022-08-12: 2 g via INTRAVENOUS
  Filled 2022-08-12: qty 50

## 2022-08-12 MED ORDER — SODIUM CHLORIDE 0.9 % IV SOLN: 6.0000 g | Freq: Once | INTRAVENOUS | Status: DC

## 2022-08-12 MED ORDER — SODIUM CHLORIDE 0.9 % IV SOLN
25.0000 mg | Freq: Once | INTRAVENOUS | Status: AC
  Administered 2022-08-12: 25 mg via INTRAVENOUS
  Filled 2022-08-12: qty 1

## 2022-08-12 MED ORDER — HEPARIN SOD (PORK) LOCK FLUSH 100 UNIT/ML IV SOLN
500.0000 [IU] | Freq: Once | INTRAVENOUS | Status: AC | PRN
  Administered 2022-08-12: 500 [IU]
  Filled 2022-08-12: qty 5

## 2022-08-12 MED ORDER — MAGNESIUM SULFATE 4 GM/100ML IV SOLN
4.0000 g | Freq: Once | INTRAVENOUS | Status: AC
  Administered 2022-08-12: 4 g via INTRAVENOUS
  Filled 2022-08-12: qty 100

## 2022-08-12 MED ORDER — SODIUM CHLORIDE 0.9 % IV SOLN
Freq: Once | INTRAVENOUS | Status: AC
  Filled 2022-08-12: qty 250

## 2022-08-12 MED ORDER — SODIUM CHLORIDE 0.9% FLUSH
10.0000 mL | Freq: Once | INTRAVENOUS | Status: AC | PRN
  Administered 2022-08-12: 10 mL
  Filled 2022-08-12: qty 10

## 2022-08-12 NOTE — Progress Notes (Signed)
Hematology/Oncology Progress note [phone] [phone]     Chief Complaint: Sarah [name] is a 67 y.o. 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  Light chain ratio is normal. 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 Repeat immunofixation negative. . Labs reviewed and discussed with patient, stable M protein and stable light chain ratio Today's level is pending.   Daratumumab on hold due to frequent infections despite IVIG Continue acyclovir prophylaxis.    B12 deficiency B12 level has improved.  I will hold off B12 injections.  Diarrhea chronic diarrhea,antidiarrhea PRN.  Hypomagnesemia #Chronic hypomagnesemia proceed with  IV magnesium today.  Continue weekly magnesium +/- IV magnesium.     Iron deficiency anemia due to chronic blood loss S/p Venofer weekly x 4 Hb has improved. Observation. .    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   Previously on Lyrica and nortriptyline-not effective Neurology recommend Tramadol and may consider higher dose of  Nortriptyline in the future. Follows up with neurology.  Stem cell transplant candidate -[facility]- Post transplant RN coordinator [phone]).    Orders Placed This Encounter  Procedures   Comprehensive metabolic panel    Standing Status:   Future    Standing Expiration Date:   08/12/2023   CBC with Differential/Platelet    Standing Status:   Future    Standing Expiration Date:   08/13/2023   Kappa/lambda light chains    Standing Status:    Future    Standing Expiration Date:   08/12/2023   Multiple Myeloma Panel (SPEP&IFE w/QIG)    Standing Status:   Future    Standing Expiration Date:   08/12/2023   Magnesium    Standing Status:   Future    Standing Expiration Date:   08/13/2023    Follow up  Lab Mag weekly x3  + IV mag.  4 weeks flex lab MD IV mag,  same labs  All questions were answered. The patient knows to call the clinic with any problems, questions or concerns.  Sarah Server, MD, PhD [facility] Hematology Oncology 08/12/2022       PERTINENT ONCOLOGY HISTORY Sarah [name] Patient is a 67 y.o.afemale who has above oncology history reviewed by me today presented for follow up visit for management of multiple myeloma Patient previously followed up by Dr.Corcoran, patient switched care to me on 11/23/20 Extensive medical record review was performed by me  stage III IgA lambda light chain multiple myeloma s/p autologous stem cell transplant on 06/14/2015 at [facility] and second autologous stem cell transplant on 05/10/2020 at [facility].   Initial bone marrow revealed 80% plasma cells.  Lambda free light chains were 1340.  She had nephrotic range proteinuria.  She initially underwent induction with RVD.  Revlimid maintenance was discontinued on 01/21/2017 secondary to intolerance.     07/19/2019 Bone marrow aspirate and biopsy on  revealed a normocellular marrow with but increased lambda-restricted plasma cells (9% aspirate, 40% CD138 immunohistochemistry).  Findings were consistent with recurrent plasma cell myeloma.  Flow cytometry revealed no monoclonal B-cell or phenotypically aberrant T-cell population. Cytogenetics were 47, XX (normal).  FISH  revealed a duplication of 1q and deletion of 13q.    Lambda light chains have been followed: 22.2 (ratio 0.56) on 07/03/2017, 30.8 (ratio 0.78) on 09/02/2017, 36.9 (ratio 0.40) on 10/21/2017, 37.4 (ratio 0.41) on 12/16/2017, 70.7(ratio 0.31)  on 02/17/2018, 64.2 (ratio 0.27)  on 04/07/2018, 78.9 (ratio 0.18) on 05/26/2018, 128.8 (ratio 0.17) on 08/06/2018, 181.5 (ratio 0.13) on 10/08/2018, 130.9 (ratio 0.13) on 10/20/2018, 160.7 (ratio 0.10) on 12/09/2018, 236.6 (ratio 0.07) on 02/01/2019, 363.6 (ratio 0.04) on 03/22/2019, 404.8 (ratio 0.04) on 04/05/2019, 420.7 (ratio 0.03) on 05/24/2019, 573.4 (ratio 0.03) on 06/23/2019, 451.05 (ratio 0.02) on 08/20/2019, 47.2 (ratio 0.13) on 10/25/2019, 22.4 (ratio 0.21) on 11/25/2019, 16.5 (ratio 0.33) on 01/10/2020, 14.6 (ratio 0.34) on 02/07/2020, 13.1 (ratio 0.31) on 03/07/2020, 10.1 (ratio 0.38) on 04/10/2020, and 9.5 (ratio 0.21) on 06/19/2020.   24 hour UPEP on 06/03/2019 revealed kappa free light chains 95.76, lambda free light chains 1,260.71, and ratio 0.08.  24 hour UPEP on 08/23/2019 revealed total protein of 782 mg/24 hrs with lambda free light chains 1,084.16 mg/L and ratio of 0.10 (1.03-31.76).  M spike in urine was 46.1% (361 mg/24 hrs).    Bone survey on 04/08/2016 and 05/28/2017 revealed no definite lytic lesion seen in the visualized skeleton.  Bone survey on 11/19/2018 revealed no suspicious lucent lesions and no acute bony abnormality.  PET scan on 07/12/2019 revealed no focal metabolic activity to suggest active myeloma within the skeleton. There were no lytic lesions identified on the CT portion of the exam or soft tissue plasmacytomas. There was no evidence of multiple myeloma.    Pretreatment RBC phenotype on 09/23/2019 was positive for C, e, DUFFY B, KIDD B, M, S, and s antigen; negative for c, E, KELL, DUFFY A, KIDD A, and N antigen.    09/27/2019 - 10/25/2019; 12/09/2019 - 03/13/2020 6 cycles of daratumumab and hyaluronidase-fihj, Pomalyst, and Decadron (DPd) .  Cycle #1 was complicated by fever and neutropenia requiring admission.  Cycle #2 was complicated by pneumonia requiring admission.  Cycle #6 was complicated with an ER evaluation for an elevated lactic acid.   05/10/2020 second autologous stem cell  transplant at [facility]   She underwent conditioning with melphalan 140 mg/m2.    She is s/p week #3 Velcade maintenance (began 08/31/2020 - 09/28/2020).  She has no increase in neuropathy.  She has severe aortic stenosis.  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 Cardiology is following for a possible TAVR in the future.  Echo on 07/05/2020 revealed moderate to severe aortic stenosis with an EF of 50-55%.  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 Zometa was discontinued in 01/2017.  She has chronic nausea on Phenergan.     B12 deficiency.  B12 was 254 on 04/09/2017, 295 on 08/20/2018, and 391 on 10/08/2018.  She was on oral B12.  She received B12 monthly (last 09/14/2020).  Folate was 12.1 on 01/10/2020.    She has iron deficiency.  Ferritin was 32 on 07/01/2019.  She received Venofer on 07/15/2019 and 07/22/2019.   She has hypogammaglobulinemia.  IgG was 245 on 11/25/2019.  She received monthly IVIG (07/22/021 - 03/22/2020).  She received IVIG 400 mg/kg on 01/20/2020, 200 mg/kg on 02/17/2020, and 300 mg/kg on 03/22/2020.  IVIG on [id number] was complicated by acute renal failure. IgG trough level was 418 on 02/17/2020.  10/15/2019 - 10/20/2019 She was admitted to [facility] with fever and  neutropenia.  Cultures were negative.  CXR was negative. She received broad spectrum antibiotics and daily Granix.  She received IVF for acute renal insufficiency due to diarrhea and dehydration.  Creatine was 1.66 on admission and 1.12 on discharge.    03/01/2020 - 03/05/2020 admitted to [facility] from with fever and neutropenia. CXR revealed no active cardiopulmonary disease. Chest CT with contrast revealed no acute intrathoracic pathology. There were findings which could be suggestive of prior granulomatous disease. She was treated with Cefepime and Vancomycin, then switched to ciprofloxacin on 03/03/2020.   05/08/2020 -  12/05/2021The patient was admitted to [facility] from  for autologous bone marrow transplant.   She received conditioning with melphalan 140 mg/m2.  Bone marrow transplant was on 05/10/2020. The patient developed fevers daily from 05/19/2020 - 05/24/2020. Blood cultures were + for strept sanguis bacteremia.  She was treated cefepime, vancomycin, and solumedrol for possible engraftment syndrome. Cefepime was switched to ceftriaxone; she completed 2 weeks of ceftriaxone on 06/03/2020. She had chemotherapy induced diarrhea.  She experienced urinary retention.  Feb 2022 patient has been on maintenance Velcade 2 mg every 2 weeks.  Chronic diarrhea seen by gastroenterology Dr. Vicente Males and was recommended to avoid artificial sugars in her diet including Diet Coke and coffee mate.  She feels some improvement of her diarrhea frequency since the dietary modification.  GI profile PCR negative, C. difficile toxin A+ B negative, fecal calprotectin 77 12/19/2020 colonoscopy showed 2 subcentimeter polyps in the ascending colon, resected and removed.  Pathology showed tubular adenoma.  No malignancy.  Normal mucosa in the entire examined colon.  Biopsied, pathology showed colonic mucosa with no significant pathological alteration.  Negative for active inflammation and features of chronicity.  Negative for microscopic colitis, dysplasia, and malignanc  Diabetes, patient has discontinued metformin due to diarrhea and started on glipizide 2.5 mg   03/05/2021-03/09/2021 Patient was hospitalized due to fever and chills, nonproductive cough, shortness of breath. X-ray showed right lower lobe atelectasis versus pneumonia.  Blood cultures negative.  Urine culture showed 60,000 colonies of Enterococcus facialis.  Patient received antibiotics. Patient also had abdominal pelvis CT scan for work-up of intermittent lower abdomen pain.No acute abnormality.   05/14/21 last dose of Velcade maintenance.  establish care with neurology Dr. Gurney Maxin and her neuropathy regimen has been switched from gabapentin to Lyrica. 05/29/2021 patient got influenza   neuropathy medication has been switched from gabapentin/Lyrica to nortriptyline.  08/18/2021 Hospitalized due to pneumonia from metapnuemovirus, hospitalization was complicated with NSTEMI. She received heparin gtt.  Treated with antibiotics for coverage of pneumonia,  diuretics for pulmonary edema. She received IVIG 422m/kg x 1 for hypoglobulinemia. Discharged on 08/25/21 with Augmentin and Zithromax.   May 2023, Daratumumab being held for [facility] infectious disease physician Dr. GFatima Sangerdue to frequent infection.  Diagnosed with CHF [HFrEF] -NYHA class II-III, she follows up with cardiology, started on entresto  hospitalization due to CHF exacerbation, community acquired pneumonia.   Presented to emergency room on 07/01/2022 Respiratory panel RT-PCR negative for COVID-19, influenza and RSV.   INTERVAL HISTORY CEvalyn Eigstiis a 67y.o. female who has above history reviewed by me today presents for follow up visit for management of multiple myeloma.  Patient has been on weekly magnesium infusion as needed hypomagnesemia + CHF [nonischemic cardiomyopathy], aortic stenosis, follows up with cardiology. + Patient continues to have some nasal congestion, No fever or chills.      Past Medical History:  Diagnosis Date   Anemia  Anxiety    Aortic stenosis    a. 05/2020 Echo: EF 45-50%, sev AS - seen by TAVR team @ [facility] - CTA sugg of tricuspid valve w/ fusing of 2 leaflets-TAVR deferred in setting of acute infxn; b. 07/2020 Echo: EF 50-55%, mod-sev AS; c. 12/2020 Echo: EF>55%. Mod-sev paradoxical low-flow low-gradient AS; d. 08/2021 Echo: EF 35-40%, severe AS, triv AI, mild MR.   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 Variable EF over time; b. 08/2017 Echo: EF  40%; b. 03/2020 Echo: EF 55-60%; c. 05/2020 Echo: EF 45-50%; d. 07/2020 Echo: EF 50-55%; e. 12/2020 Echo: EF>55%; f. 08/2021 Echo: EF 35-40%.   Chronic heart failure with preserved ejection fraction (HFpEF) ([place])    a. Variable EF over time; b. 08/2017 Echo: EF 40%; b. 03/2020 Echo: EF 55-60%; c. 05/2020 Echo: EF 45-50%; d. 07/2020 Echo: EF 50-55%; e. 12/2020 Echo: EF>55%; f. 08/2021 Echo: EF 35-40%, no RV, mildly dil LA, mild MR, triv AI, severe AS.   CKD (chronic kidney disease) stage 3, GFR 30-59 ml/min (HCC)    Depression    Diabetes mellitus (HCC)    Dizziness    Fatty liver    Frequent falls    GERD (gastroesophageal reflux disease)    Gout    Heart murmur    History of blood transfusion    History of bone marrow transplant ([place])    History of uterine fibroid    Hx of cardiac catheterization    a. 01/2016 Cath [facility] - after abnl nuc): Nl cors.   Hypertension    Hypomagnesemia    IDA (iron deficiency anemia)    Multiple myeloma (HCC)    Personal history of chemotherapy    PSVT (paroxysmal supraventricular tachycardia)    a. S/p [facility]).   PVC's (premature ventricular contractions)    a. Well-managed w/ bisoprolol in outpt setting.   Renal cyst     Past Surgical History:  Procedure Laterality Date   ABDOMINAL HYSTERECTOMY     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   CARDIAC ELECTROPHYSIOLOGY MAPPING AND ABLATION     CARPAL TUNNEL RELEASE Bilateral    CHOLECYSTECTOMY  2008   COLONOSCOPY WITH PROPOFOL N/A 05/07/2017   Procedure: COLONOSCOPY WITH PROPOFOL;  Surgeon: [name], MD;  Location: [facility] ENDOSCOPY;  Service: Gastroenterology;  Laterality: N/A;   COLONOSCOPY WITH PROPOFOL N/A 12/19/2020   Procedure: COLONOSCOPY WITH PROPOFOL;  Surgeon: [name], MD;  Location: [facility] ENDOSCOPY;  Service: Gastroenterology;  Laterality: N/A;   ESOPHAGOGASTRODUODENOSCOPY (EGD) WITH PROPOFOL N/A 05/07/2017   Procedure: ESOPHAGOGASTRODUODENOSCOPY (EGD) WITH PROPOFOL;  Surgeon: [name], MD;  Location: [facility] ENDOSCOPY;  Service: Gastroenterology;  Laterality: N/A;   FOOT SURGERY Bilateral    INCONTINENCE SURGERY  2009   INTERSTIM IMPLANT PLACEMENT     other     over active bladder   OTHER SURGICAL HISTORY     bladder stimulator    PARTIAL HYSTERECTOMY  03/1996   fibroids   PORTA CATH INSERTION N/A 03/10/2019   Procedure: PORTA CATH INSERTION;  Surgeon: Algernon Huxley, MD;  Location: [place] CV LAB;  Service: Cardiovascular;  Laterality: N/A;   TONSILLECTOMY  2007    Family History  Problem Relation Age of Onset   Colon cancer Father    Renal Disease Father    Diabetes Mellitus II Father  Melanoma Paternal Grandmother    Breast cancer Maternal Aunt 90   Anemia Mother    Heart disease Mother    Heart failure Mother    Renal Disease Mother    Congestive Heart Failure Mother    Heart disease Maternal Uncle    Throat cancer Maternal Uncle    Lung cancer Maternal Uncle    Liver disease Maternal Uncle    Heart failure Maternal Uncle    Hearing loss Son 10       Suicide     Social History:  reports that she quit smoking about 31 years ago. Her smoking use included cigarettes. She has a 20.00 pack-year smoking history. She has never used smokeless tobacco. She reports current alcohol use of about 2.0 standard drinks of alcohol per week. She reports that she does not use drugs.  She is on disability. She notes exposure to perchloroethylene [facility]).   Allergies:  Allergies  Allergen Reactions   Oxycodone-Acetaminophen Anaphylaxis    Swelling and rash   Celebrex [Celecoxib] Diarrhea   Codeine    Plerixafor     In 2016 during ASCT collection patient developed fever to 103.49F and required hospitalization   Benadryl [Diphenhydramine] Palpitations   Morphine Itching and Rash   Ondansetron Diarrhea   Tylenol [Acetaminophen] Itching and Rash    Current Medications: Current Outpatient Medications  Medication Sig Dispense Refill   allopurinol (ZYLOPRIM)  100 MG tablet TAKE 1 TABLET(100 MG) BY MOUTH DAILY as needed 90 tablet 1   atorvastatin (LIPITOR) 10 MG tablet Take 0.5 tablets (5 mg total) by mouth daily. 15 tablet 11   bisoprolol (ZEBETA) 10 MG tablet Take 1 tablet (10 mg total) by mouth daily. 90 tablet 1   calcium carbonate (OS-CAL) 1250 (500 Ca) MG chewable tablet Chew 2 tablets by mouth daily.     diclofenac sodium (VOLTAREN) 1 % GEL Apply 2 g topically 4 (four) times daily. 100 g 1   diphenoxylate-atropine (LOMOTIL) 2.5-0.025 MG tablet Take 2 tablets by mouth 4 (four) times daily as needed for diarrhea or loose stools. 192 tablet 3   DULoxetine (CYMBALTA) 60 MG capsule TAKE 1 CAPSULE(60 MG) BY MOUTH DAILY 90 capsule 0   empagliflozin (JARDIANCE) 10 MG TABS tablet Take 1 tablet by mouth daily.     ENTRESTO 24-26 MG Take 1 tablet by mouth 2 (two) times daily.     FLUoxetine (PROZAC) 40 MG capsule TAKE 1 CAPSULE EVERY DAY 90 capsule 0   furosemide (LASIX) 40 MG tablet Take 0.5 tablets (20 mg total) by mouth daily. Home dose.     glipiZIDE (GLUCOTROL XL) 2.5 MG 24 hr tablet Take 1 tablet (2.5 mg total) by mouth daily with breakfast. 30 tablet 0   glucose blood test strip      Multiple Vitamin (MULTIVITAMIN) tablet Take 1 tablet by mouth daily.     omeprazole (PRILOSEC) 40 MG capsule TAKE 1 CAPSULE IN THE MORNING AND TAKE 1 CAPSULE AT BEDTIME 180 capsule 1   pentoxifylline (TRENTAL) 400 MG CR tablet Take 400 mg by mouth 3 (three) times daily with meals.     promethazine (PHENERGAN) 25 MG tablet Take 1 tablet (25 mg total) by mouth every 6 (six) hours as needed for nausea or vomiting. 90 tablet 1   tiZANidine (ZANAFLEX) 4 MG tablet Take 1 tablet (4 mg total) by mouth every 6 (six) hours as needed for muscle spasms. 90 tablet 0   traZODone (DESYREL) 100 MG tablet TAKE 1 TABLET AT  BEDTIME 90 tablet 3   vitamin E 45 MG (100 UNITS) capsule Take by mouth daily.     guaiFENesin-dextromethorphan (ROBITUSSIN DM) 100-10 MG/5ML syrup Take 5 mLs by mouth  every 4 (four) hours as needed for cough. (Patient not taking: Reported on 08/12/2022) 118 mL 0   lidocaine (XYLOCAINE) 2 % solution Use as directed 15 mLs in the mouth or throat every 3 (three) hours as needed for mouth pain (swish and spit). (Patient not taking: Reported on 08/12/2022) 100 mL 0   magnesium chloride (SLOW-MAG) 64 MG TBEC SR tablet Take 1 tablet (64 mg total) by mouth daily. (Patient not taking: Reported on 08/12/2022) 60 tablet 1   montelukast (SINGULAIR) 10 MG tablet Take 1 tablet (10 mg total) by mouth See admin instructions. Take 1 tab daily for 2 days after daratumumab treatments (Patient not taking: Reported on 08/12/2022) 30 tablet 1   potassium chloride SA (KLOR-CON M) 20 MEQ tablet Take 1 tablet (20 mEq total) by mouth daily. Home med, reduced from twice daily to daily. (Patient not taking: Reported on 08/12/2022)  0   No current facility-administered medications for this visit.   Facility-Administered Medications Ordered in Other Visits  Medication Dose Route Frequency Provider Last Rate Last Admin   heparin lock flush 100 unit/mL  500 Units Intracatheter Once PRN Sarah Server, MD       magnesium sulfate IVPB 2 g 50 mL  2 g Intravenous Once Sarah Server, MD       And   magnesium sulfate IVPB 4 g 100 mL  4 g Intravenous Once Sarah Server, MD       promethazine (PHENERGAN) 25 mg in sodium chloride 0.9 % 50 mL IVPB  25 mg Intravenous Once Sarah Server, MD       sodium chloride flush (NS) 0.9 % injection 10 mL  10 mL Intravenous PRN Sarah Server, MD   10 mL at 04/02/21 0904   sodium chloride flush (NS) 0.9 % injection 10 mL  10 mL Intracatheter Once PRN Sarah Server, MD        Review of Systems  Constitutional:  Negative for chills, fever, malaise/fatigue and weight loss.  HENT:  Negative for sore throat.   Eyes:  Negative for redness.  Respiratory:  Negative for cough, shortness of breath and wheezing.   Cardiovascular:  Negative for chest pain, palpitations and leg swelling.  Gastrointestinal:   Positive for diarrhea. Negative for abdominal pain, blood in stool, nausea and vomiting.  Genitourinary:  Negative for dysuria.  Musculoskeletal:  Positive for back pain and joint pain. Negative for myalgias.  Skin:  Negative for rash.  Neurological:  Positive for tingling and sensory change. Negative for dizziness and tremors.  Endo/Heme/Allergies:  Does not bruise/bleed easily.  Psychiatric/Behavioral:  Negative for hallucinations.     Performance status (ECOG): 1  Vitals Blood pressure 128/62, pulse (!) 58, temperature 97.6 F (36.4 C), temperature source Tympanic, resp. rate 16, weight 156 lb 12.8 oz (71.1 kg), SpO2 98 %.  Physical Exam Constitutional:      General: She is not in acute distress.    Appearance: She is not diaphoretic.  HENT:     Head: Normocephalic and atraumatic.     Nose: Nose normal.     Mouth/Throat:     Pharynx: No oropharyngeal exudate.  Eyes:     General: No scleral icterus.    Pupils: Pupils are equal, round, and reactive to light.  Cardiovascular:     Rate and  Rhythm: Normal rate.     Heart sounds: Murmur heard.  Pulmonary:     Effort: Pulmonary effort is normal. No respiratory distress.     Breath sounds: No rales.  Chest:     Chest wall: No tenderness.  Abdominal:     General: There is no distension.     Palpations: Abdomen is soft.     Tenderness: There is no abdominal tenderness.  Musculoskeletal:        General: Normal range of motion.     Cervical back: Normal range of motion and neck supple.  Skin:    General: Skin is warm and dry.     Findings: No erythema.  Neurological:     Mental Status: She is alert and oriented to person, place, and time.     Cranial Nerves: No cranial nerve deficit.     Motor: No abnormal muscle tone.     Coordination: Coordination normal.  Psychiatric:        Mood and Affect: Mood and affect normal.      Laboratory data    Latest Ref Rng & Units 08/12/2022    9:01 AM 07/22/2022    1:32 PM 06/26/2022     9:01 AM  CBC  WBC 4.0 - 10.5 K/uL 4.6  7.2  8.7   Hemoglobin 12.0 - 15.0 g/dL 11.4  12.9  14.4   Hematocrit 36.0 - 46.0 % 34.5  39.3  40.7   Platelets 150 - 400 K/uL 111  141  121       Latest Ref Rng & Units 08/12/2022    9:01 AM 07/29/2022    8:47 AM 07/22/2022    1:32 PM  CMP  Glucose 70 - 99 mg/dL 179  155  122   BUN 8 - 23 mg/dL 31  23  22   $ Creatinine 0.44 - 1.00 mg/dL 1.19  1.20  1.14   Sodium 135 - 145 mmol/L 136  136  139   Potassium 3.5 - 5.1 mmol/L 4.3  4.3  4.1   Chloride 98 - 111 mmol/L 103  105  106   CO2 22 - 32 mmol/L 24  21  22   $ Calcium 8.9 - 10.3 mg/dL 8.4  9.4  8.7   Total Protein 6.5 - 8.1 g/dL 6.9   7.2   Total Bilirubin 0.3 - 1.2 mg/dL 0.4   0.4   Alkaline Phos 38 - 126 U/L 90   77   AST 15 - 41 U/L 29   24   ALT 0 - 44 U/L 22   21

## 2022-08-12 NOTE — Assessment & Plan Note (Signed)
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  Light chain ratio is normal. 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 Repeat immunofixation negative. . Labs reviewed and discussed with patient, stable M protein and stable light chain ratio Today's level is pending.   Daratumumab on hold due to frequent infections despite IVIG Continue acyclovir prophylaxis.

## 2022-08-12 NOTE — Assessment & Plan Note (Signed)
chronic diarrhea,antidiarrhea PRN.

## 2022-08-12 NOTE — Assessment & Plan Note (Signed)
#  Chronic hypomagnesemia proceed with  IV magnesium today.  Continue weekly magnesium +/- IV magnesium.

## 2022-08-12 NOTE — Assessment & Plan Note (Signed)
S/p Venofer weekly x 4 Hb has improved. Observation. Marland Kitchen

## 2022-08-12 NOTE — Assessment & Plan Note (Signed)
-  [facility]- Post transplant Associate Professor [phone]).

## 2022-08-12 NOTE — Assessment & Plan Note (Signed)
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   Previously on Lyrica and nortriptyline-not effective Neurology recommend Tramadol and may consider higher dose of  Nortriptyline in the future. Follows up with neurology.

## 2022-08-12 NOTE — Assessment & Plan Note (Signed)
B12 level has improved.  I will hold off B12 injections.

## 2022-08-13 LAB — KAPPA/LAMBDA LIGHT CHAINS
Kappa free light chain: 27.5 mg/L — ABNORMAL HIGH (ref 3.3–19.4)
Kappa, lambda light chain ratio: 1.01 (ref 0.26–1.65)
Lambda free light chains: 27.2 mg/L — ABNORMAL HIGH (ref 5.7–26.3)

## 2022-08-15 ENCOUNTER — Telehealth: Payer: Self-pay

## 2022-08-15 NOTE — Telephone Encounter (Signed)
-----   Message from Earlie Server, MD sent at 08/14/2022 10:51 PM EST ----- Please add B12 inj to her next visit with me.

## 2022-08-19 ENCOUNTER — Inpatient Hospital Stay: Payer: Medicare PPO

## 2022-08-19 VITALS — BP 125/73 | HR 62 | Temp 97.1°F | Resp 20

## 2022-08-19 DIAGNOSIS — C9001 Multiple myeloma in remission: Secondary | ICD-10-CM | POA: Diagnosis not present

## 2022-08-19 DIAGNOSIS — G620 Drug-induced polyneuropathy: Secondary | ICD-10-CM | POA: Diagnosis not present

## 2022-08-19 DIAGNOSIS — C9000 Multiple myeloma not having achieved remission: Secondary | ICD-10-CM

## 2022-08-19 DIAGNOSIS — F32A Depression, unspecified: Secondary | ICD-10-CM | POA: Diagnosis not present

## 2022-08-19 DIAGNOSIS — D500 Iron deficiency anemia secondary to blood loss (chronic): Secondary | ICD-10-CM | POA: Diagnosis not present

## 2022-08-19 DIAGNOSIS — R197 Diarrhea, unspecified: Secondary | ICD-10-CM | POA: Diagnosis not present

## 2022-08-19 DIAGNOSIS — E538 Deficiency of other specified B group vitamins: Secondary | ICD-10-CM | POA: Diagnosis not present

## 2022-08-19 DIAGNOSIS — I350 Nonrheumatic aortic (valve) stenosis: Secondary | ICD-10-CM | POA: Diagnosis not present

## 2022-08-19 DIAGNOSIS — T451X5A Adverse effect of antineoplastic and immunosuppressive drugs, initial encounter: Secondary | ICD-10-CM | POA: Diagnosis not present

## 2022-08-19 LAB — MAGNESIUM: Magnesium: 1.2 mg/dL — ABNORMAL LOW (ref 1.7–2.4)

## 2022-08-19 MED ORDER — HEPARIN SOD (PORK) LOCK FLUSH 100 UNIT/ML IV SOLN
500.0000 [IU] | Freq: Once | INTRAVENOUS | Status: AC | PRN
  Administered 2022-08-19: 500 [IU]
  Filled 2022-08-19: qty 5

## 2022-08-19 MED ORDER — MAGNESIUM SULFATE 2 GM/50ML IV SOLN
2.0000 g | Freq: Once | INTRAVENOUS | Status: AC
  Administered 2022-08-19: 2 g via INTRAVENOUS
  Filled 2022-08-19: qty 50

## 2022-08-19 MED ORDER — MAGNESIUM SULFATE 4 GM/100ML IV SOLN
4.0000 g | Freq: Once | INTRAVENOUS | Status: AC
  Administered 2022-08-19: 4 g via INTRAVENOUS
  Filled 2022-08-19: qty 100

## 2022-08-19 MED ORDER — SODIUM CHLORIDE 0.9 % IV SOLN
Freq: Once | INTRAVENOUS | Status: AC
  Filled 2022-08-19: qty 250

## 2022-08-19 MED ORDER — SODIUM CHLORIDE 0.9 % IV SOLN: 6.0000 g | Freq: Once | INTRAVENOUS | Status: DC

## 2022-08-19 MED ORDER — SODIUM CHLORIDE 0.9% FLUSH
10.0000 mL | Freq: Once | INTRAVENOUS | Status: AC | PRN
  Administered 2022-08-19: 10 mL
  Filled 2022-08-19: qty 10

## 2022-08-19 MED ORDER — SODIUM CHLORIDE 0.9 % IV SOLN
25.0000 mg | Freq: Once | INTRAVENOUS | Status: AC
  Administered 2022-08-19: 25 mg via INTRAVENOUS
  Filled 2022-08-19: qty 1

## 2022-08-22 DIAGNOSIS — I5022 Chronic systolic (congestive) heart failure: Secondary | ICD-10-CM | POA: Diagnosis not present

## 2022-08-22 DIAGNOSIS — I428 Other cardiomyopathies: Secondary | ICD-10-CM | POA: Diagnosis not present

## 2022-08-22 DIAGNOSIS — I493 Ventricular premature depolarization: Secondary | ICD-10-CM | POA: Diagnosis not present

## 2022-08-22 DIAGNOSIS — I350 Nonrheumatic aortic (valve) stenosis: Secondary | ICD-10-CM | POA: Diagnosis not present

## 2022-08-22 LAB — MULTIPLE MYELOMA PANEL, SERUM
Albumin SerPl Elph-Mcnc: 3.9 g/dL (ref 2.9–4.4)
Albumin/Glob SerPl: 1.6 (ref 0.7–1.7)
Alpha 1: 0.2 g/dL (ref 0.0–0.4)
Alpha2 Glob SerPl Elph-Mcnc: 0.7 g/dL (ref 0.4–1.0)
B-Globulin SerPl Elph-Mcnc: 0.8 g/dL (ref 0.7–1.3)
Gamma Glob SerPl Elph-Mcnc: 0.7 g/dL (ref 0.4–1.8)
Globulin, Total: 2.5 g/dL (ref 2.2–3.9)
IgA: 197 mg/dL (ref 87–352)
IgG (Immunoglobin G), Serum: 872 mg/dL (ref 586–1602)
IgM (Immunoglobulin M), Srm: 28 mg/dL (ref 26–217)
Total Protein ELP: 6.4 g/dL (ref 6.0–8.5)

## 2022-08-26 ENCOUNTER — Inpatient Hospital Stay: Payer: Medicare PPO

## 2022-08-26 VITALS — BP 119/71 | HR 63

## 2022-08-26 DIAGNOSIS — C9001 Multiple myeloma in remission: Secondary | ICD-10-CM | POA: Diagnosis not present

## 2022-08-26 DIAGNOSIS — C9000 Multiple myeloma not having achieved remission: Secondary | ICD-10-CM

## 2022-08-26 DIAGNOSIS — T451X5A Adverse effect of antineoplastic and immunosuppressive drugs, initial encounter: Secondary | ICD-10-CM | POA: Diagnosis not present

## 2022-08-26 DIAGNOSIS — I350 Nonrheumatic aortic (valve) stenosis: Secondary | ICD-10-CM | POA: Diagnosis not present

## 2022-08-26 DIAGNOSIS — F32A Depression, unspecified: Secondary | ICD-10-CM | POA: Diagnosis not present

## 2022-08-26 DIAGNOSIS — E538 Deficiency of other specified B group vitamins: Secondary | ICD-10-CM | POA: Diagnosis not present

## 2022-08-26 DIAGNOSIS — G620 Drug-induced polyneuropathy: Secondary | ICD-10-CM | POA: Diagnosis not present

## 2022-08-26 DIAGNOSIS — D500 Iron deficiency anemia secondary to blood loss (chronic): Secondary | ICD-10-CM | POA: Diagnosis not present

## 2022-08-26 DIAGNOSIS — R197 Diarrhea, unspecified: Secondary | ICD-10-CM | POA: Diagnosis not present

## 2022-08-26 LAB — MAGNESIUM: Magnesium: 1.2 mg/dL — ABNORMAL LOW (ref 1.7–2.4)

## 2022-08-26 MED ORDER — SODIUM CHLORIDE 0.9 % IV SOLN
25.0000 mg | Freq: Once | INTRAVENOUS | Status: AC
  Administered 2022-08-26: 25 mg via INTRAVENOUS
  Filled 2022-08-26: qty 1

## 2022-08-26 MED ORDER — SODIUM CHLORIDE 0.9% FLUSH
10.0000 mL | Freq: Once | INTRAVENOUS | Status: AC
  Administered 2022-08-26: 10 mL via INTRAVENOUS
  Filled 2022-08-26: qty 10

## 2022-08-26 MED ORDER — SODIUM CHLORIDE 0.9 % IV SOLN
Freq: Once | INTRAVENOUS | Status: AC
  Filled 2022-08-26: qty 250

## 2022-08-26 MED ORDER — SODIUM CHLORIDE 0.9 % IV SOLN: 6.0000 g | Freq: Once | INTRAVENOUS | Status: DC

## 2022-08-26 MED ORDER — MAGNESIUM SULFATE 2 GM/50ML IV SOLN
2.0000 g | INTRAVENOUS | Status: AC
  Administered 2022-08-26: 2 g via INTRAVENOUS
  Filled 2022-08-26: qty 50

## 2022-08-26 MED ORDER — MAGNESIUM SULFATE 4 GM/100ML IV SOLN
4.0000 g | Freq: Once | INTRAVENOUS | Status: AC
  Administered 2022-08-26: 4 g via INTRAVENOUS

## 2022-08-26 MED ORDER — HEPARIN SOD (PORK) LOCK FLUSH 100 UNIT/ML IV SOLN
500.0000 [IU] | Freq: Once | INTRAVENOUS | Status: AC
  Administered 2022-08-26: 500 [IU] via INTRAVENOUS
  Filled 2022-08-26: qty 5

## 2022-08-26 NOTE — Patient Instructions (Signed)
Magnesium Sulfate Injection What is this medication? MAGNESIUM SULFATE (mag NEE zee um SUL fate) prevents and treats low levels of magnesium in your body. It may also be used to prevent and treat seizures during pregnancy in people with high blood pressure disorders, such as preeclampsia or eclampsia. Magnesium plays an important role in maintaining the health of your muscles and nervous system. This medicine may be used for other purposes; ask your health care provider or pharmacist if you have questions. What should I tell my care team before I take this medication? They need to know if you have any of these conditions: Heart disease History of irregular heart beat Kidney disease An unusual or allergic reaction to magnesium sulfate, medications, foods, dyes, or preservatives Pregnant or trying to get pregnant Breast-feeding How should I use this medication? This medication is for infusion into a vein. It is given in a hospital or clinic setting. Talk to your care team about the use of this medication in children. While this medication may be prescribed for selected conditions, precautions do apply. Overdosage: If you think you have taken too much of this medicine contact a poison control center or emergency room at once. NOTE: This medicine is only for you. Do not share this medicine with others. What if I miss a dose? This does not apply. What may interact with this medication? Certain medications for anxiety or sleep Certain medications for seizures, such phenobarbital Digoxin Medications that relax muscles for surgery Narcotic medications for pain This list may not describe all possible interactions. Give your health care provider a list of all the medicines, herbs, non-prescription drugs, or dietary supplements you use. Also tell them if you smoke, drink alcohol, or use illegal drugs. Some items may interact with your medicine. What should I watch for while using this  medication? Your condition will be monitored carefully while you are receiving this medication. You may need blood work done while you are receiving this medication. What side effects may I notice from receiving this medication? Side effects that you should report to your care team as soon as possible: Allergic reactions--skin rash, itching, hives, swelling of the face, lips, tongue, or throat High magnesium level--confusion, drowsiness, facial flushing, redness, sweating, muscle weakness, fast or irregular heartbeat, trouble breathing Low blood pressure--dizziness, feeling faint or lightheaded, blurry vision Side effects that usually do not require medical attention (report to your care team if they continue or are bothersome): Headache Nausea This list may not describe all possible side effects. Call your doctor for medical advice about side effects. You may report side effects to FDA at 1-800-FDA-1088. Where should I keep my medication? This medication is given in a hospital or clinic and will not be stored at home. NOTE: This sheet is a summary. It may not cover all possible information. If you have questions about this medicine, talk to your doctor, pharmacist, or health care provider.  2023 [employer] (2012-10-23 00:00:00)  

## 2022-08-27 ENCOUNTER — Encounter: Payer: Self-pay | Admitting: Internal Medicine

## 2022-08-27 ENCOUNTER — Encounter: Payer: Medicare PPO | Attending: Family Medicine | Admitting: *Deleted

## 2022-08-27 DIAGNOSIS — I5042 Chronic combined systolic (congestive) and diastolic (congestive) heart failure: Secondary | ICD-10-CM

## 2022-08-27 NOTE — Progress Notes (Signed)
Initial phone call completed. Diagnosis can be found in [facility] 2/22. EP Orientation scheduled for Thursday 2/29 at 9:30.

## 2022-08-29 VITALS — Ht 64.5 in | Wt 153.7 lb

## 2022-08-29 DIAGNOSIS — I5042 Chronic combined systolic (congestive) and diastolic (congestive) heart failure: Secondary | ICD-10-CM | POA: Diagnosis not present

## 2022-08-29 NOTE — Progress Notes (Signed)
Cardiac Individual Treatment Plan  Patient Details  Name: Sarah [name] MRN: [id number] Date of Birth: [birth year] Referring Provider:   Flowsheet Row Cardiac Rehab from 08/29/2022 in [facility] Cardiac and Pulmonary Rehab  Referring Provider Kandis Cocking, MD       Initial Encounter Date:  Flowsheet Row Cardiac Rehab from 08/29/2022 in [facility] Cardiac and Pulmonary Rehab  Date 08/29/22       Visit Diagnosis: Heart failure, systolic and diastolic, chronic ([place])  Patient's Home Medications on Admission:  Current Outpatient Medications:    allopurinol (ZYLOPRIM) 100 MG tablet, TAKE 1 TABLET(100 MG) BY MOUTH DAILY as needed, Disp: 90 tablet, Rfl: 1   atorvastatin (LIPITOR) 10 MG tablet, Take 0.5 tablets (5 mg total) by mouth daily., Disp: 15 tablet, Rfl: 11   bisoprolol (ZEBETA) 10 MG tablet, Take 1 tablet (10 mg total) by mouth daily., Disp: 90 tablet, Rfl: 1   calcium carbonate (OS-CAL) 1250 (500 Ca) MG chewable tablet, Chew 2 tablets by mouth daily., Disp: , Rfl:    diclofenac sodium (VOLTAREN) 1 % GEL, Apply 2 g topically 4 (four) times daily., Disp: 100 g, Rfl: 1   diphenoxylate-atropine (LOMOTIL) 2.5-0.025 MG tablet, Take 2 tablets by mouth 4 (four) times daily as needed for diarrhea or loose stools., Disp: 192 tablet, Rfl: 3   DULoxetine (CYMBALTA) 60 MG capsule, TAKE 1 CAPSULE(60 MG) BY MOUTH DAILY (Patient not taking: Reported on 08/27/2022), Disp: 90 capsule, Rfl: 0   empagliflozin (JARDIANCE) 10 MG TABS tablet, Take 1 tablet by mouth daily., Disp: , Rfl:    ENTRESTO 24-26 MG, Take 1 tablet by mouth 2 (two) times daily. (Patient not taking: Reported on 08/27/2022), Disp: , Rfl:    FLUoxetine (PROZAC) 40 MG capsule, TAKE 1 CAPSULE EVERY DAY (Patient not taking: Reported on 08/27/2022), Disp: 90 capsule, Rfl: 0   furosemide (LASIX) 40 MG tablet, Take 0.5 tablets (20 mg total) by mouth daily. Home dose., Disp: , Rfl:    glipiZIDE (GLUCOTROL XL) 2.5 MG 24 hr tablet, Take 1 tablet (2.5 mg total)  by mouth daily with breakfast., Disp: 30 tablet, Rfl: 0   glucose blood test strip, , Disp: , Rfl:    guaiFENesin-dextromethorphan (ROBITUSSIN DM) 100-10 MG/5ML syrup, Take 5 mLs by mouth every 4 (four) hours as needed for cough. (Patient not taking: Reported on 08/12/2022), Disp: 118 mL, Rfl: 0   lidocaine (XYLOCAINE) 2 % solution, Use as directed 15 mLs in the mouth or throat every 3 (three) hours as needed for mouth pain (swish and spit). (Patient not taking: Reported on 08/12/2022), Disp: 100 mL, Rfl: 0   magnesium chloride (SLOW-MAG) 64 MG TBEC SR tablet, Take 1 tablet (64 mg total) by mouth daily. (Patient not taking: Reported on 08/12/2022), Disp: 60 tablet, Rfl: 1   montelukast (SINGULAIR) 10 MG tablet, Take 1 tablet (10 mg total) by mouth See admin instructions. Take 1 tab daily for 2 days after daratumumab treatments, Disp: 30 tablet, Rfl: 1   Multiple Vitamin (MULTIVITAMIN) tablet, Take 1 tablet by mouth daily., Disp: , Rfl:    omeprazole (PRILOSEC) 40 MG capsule, TAKE 1 CAPSULE IN THE MORNING AND TAKE 1 CAPSULE AT BEDTIME, Disp: 180 capsule, Rfl: 1   pentoxifylline (TRENTAL) 400 MG CR tablet, Take 400 mg by mouth 3 (three) times daily with meals., Disp: , Rfl:    potassium chloride SA (KLOR-CON M) 20 MEQ tablet, Take 1 tablet (20 mEq total) by mouth daily. Home med, reduced from twice daily to daily., Disp: ,  Rfl: 0   promethazine (PHENERGAN) 25 MG tablet, Take 1 tablet (25 mg total) by mouth every 6 (six) hours as needed for nausea or vomiting., Disp: 90 tablet, Rfl: 1   spironolactone (ALDACTONE) 25 MG tablet, Take by mouth., Disp: , Rfl:    tiZANidine (ZANAFLEX) 4 MG tablet, Take 1 tablet (4 mg total) by mouth every 6 (six) hours as needed for muscle spasms., Disp: 90 tablet, Rfl: 0   traMADol (ULTRAM) 50 MG tablet, , Disp: , Rfl:    traZODone (DESYREL) 100 MG tablet, TAKE 1 TABLET AT BEDTIME, Disp: 90 tablet, Rfl: 3   vitamin E 45 MG (100 UNITS) capsule, Take by mouth daily., Disp: , Rfl:   No current facility-administered medications for this visit.  Facility-Administered Medications Ordered in Other Visits:    sodium chloride flush (NS) 0.9 % injection 10 mL, 10 mL, Intravenous, PRN, Earlie Server, MD, 10 mL at 04/02/21 [id number]  Past Medical History: Past Medical History:  Diagnosis Date   Anemia    Anxiety    Aortic stenosis    a. 05/2020 Echo: EF 45-50%, sev AS - seen by TAVR team @ [facility] - CTA sugg of tricuspid valve w/ fusing of 2 leaflets-TAVR deferred in setting of acute infxn; b. 07/2020 Echo: EF 50-55%, mod-sev AS; c. 12/2020 Echo: EF>55%. Mod-sev paradoxical low-flow low-gradient AS; d. 08/2021 Echo: EF 35-40%, severe AS, triv AI, mild MR.   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 Variable EF over time; b. 08/2017 Echo: EF 40%; b. 03/2020 Echo: EF 55-60%; c. 05/2020 Echo: EF 45-50%; d. 07/2020 Echo: EF 50-55%; e. 12/2020 Echo: EF>55%; f. 08/2021 Echo: EF 35-40%.   Chronic heart failure with preserved ejection fraction (HFpEF) ([place])    a. Variable EF over time; b. 08/2017 Echo: EF 40%; b. 03/2020 Echo: EF 55-60%; c. 05/2020 Echo: EF 45-50%; d. 07/2020 Echo: EF 50-55%; e. 12/2020 Echo: EF>55%; f. 08/2021 Echo: EF 35-40%, no RV, mildly dil LA, mild MR, triv AI, severe AS.   CKD (chronic kidney disease) stage 3, GFR 30-59 ml/min (HCC)    Depression    Diabetes mellitus (HCC)    Dizziness    Fatty liver    Frequent falls    GERD (gastroesophageal reflux disease)    Gout    Heart murmur    History of blood transfusion    History of bone marrow transplant ([place])    History of uterine fibroid    Hx of cardiac catheterization    a. 01/2016 Cath [facility] - after abnl nuc): Nl cors.   Hypertension    Hypomagnesemia    IDA (iron deficiency anemia)    Multiple myeloma (HCC)    Personal history of chemotherapy    PSVT (paroxysmal supraventricular tachycardia)    a.  S/p [facility]).   PVC's (premature ventricular contractions)    a. Well-managed w/ bisoprolol in outpt setting.   Renal cyst     Tobacco Use: Social History   Tobacco Use  Smoking Status Former   Packs/day: 1.00   Years: 20.00   Total pack years: 20.00   Types: Cigarettes   Quit date: 07/02/1991   Years since quitting: 31.1  Smokeless Tobacco Never    Labs: Review Flowsheet  More data exists      Latest Ref Rng & Units 08/21/2021 08/24/2021 12/07/2021 02/05/2022 06/12/2022  Labs for ITP Cardiac and Pulmonary Rehab  Cholestrol 0 - 200 mg/dL 118  99  171  175  -  LDL (calc) 0 - 99 mg/dL 58  44  98  91  -  HDL-C >40 mg/dL 48  40  59  67  -  Trlycerides <150 mg/dL 61  77  76  87  -  Hemoglobin A1c 4.8 - 5.6 % 7.5  - 6.9  - 6.6      Exercise Target Goals: Exercise Program Goal: Individual exercise prescription set using results from initial 6 min walk test and THRR while considering  patient's activity barriers and safety.   Exercise Prescription Goal: Initial exercise prescription builds to 30-45 minutes a day of aerobic activity, 2-3 days per week.  Home exercise guidelines will be given to patient during program as part of exercise prescription that the participant will acknowledge.   Education: Aerobic Exercise: - Group verbal and visual presentation on the components of exercise prescription. Introduces F.I.T.T principle from ACSM for exercise prescriptions.  Reviews F.I.T.T. principles of aerobic exercise including progression. Written material given at graduation. Flowsheet Row Cardiac Rehab from 08/29/2022 in [facility] Cardiac and Pulmonary Rehab  Education need identified 08/29/22       Education: Resistance Exercise: - Group verbal and visual presentation on the components of exercise prescription. Introduces F.I.T.T principle from ACSM for exercise prescriptions  Reviews F.I.T.T. principles of resistance exercise including progression. Written material given at  graduation.    Education: Exercise & Equipment Safety: - Individual verbal instruction and demonstration of equipment use and safety with use of the equipment. Flowsheet Row Cardiac Rehab from 08/29/2022 in [facility] Cardiac and Pulmonary Rehab  Date 08/29/22  Educator NT  Instruction Review Code 1- Verbalizes Understanding       Education: Exercise Physiology & General Exercise Guidelines: - Group verbal and written instruction with models to review the exercise physiology of the cardiovascular system and associated critical values. Provides general exercise guidelines with specific guidelines to those with heart or lung disease.    Education: Flexibility, Balance, Mind/Body Relaxation: - Group verbal and visual presentation with interactive activity on the components of exercise prescription. Introduces F.I.T.T principle from ACSM for exercise prescriptions. Reviews F.I.T.T. principles of flexibility and balance exercise training including progression. Also discusses the mind body connection.  Reviews various relaxation techniques to help reduce and manage stress (i.e. Deep breathing, progressive muscle relaxation, and visualization). Balance handout provided to take home. Written material given at graduation.   Activity Barriers & Risk Stratification:  Activity Barriers & Cardiac Risk Stratification - 08/29/22 1347       Activity Barriers & Cardiac Risk Stratification   Activity Barriers Back Problems;Balance Concerns   lower back pain, sciatic pain, carpal tunnel   Cardiac Risk Stratification High             6 Minute Walk:  6 Minute Walk     Row Name 08/29/22 1343         6 Minute Walk   Phase Initial     Distance 1095 feet     Walk Time 6 minutes     # of Rest Breaks 0     MPH 2.07     METS 2.67     RPE 9     Perceived Dyspnea  1     VO2 Peak 9.34     Symptoms Yes (comment)     Comments R leg numbness, Low Back pain 7/10  Resting HR 67 bpm     Resting BP  112/62     Resting Oxygen Saturation  99 %     Exercise Oxygen Saturation  during 6 min walk 98 %     Max Ex. HR 89 bpm     Max Ex. BP 120/64     2 Minute Post BP 110/62              Oxygen Initial Assessment:   Oxygen Re-Evaluation:   Oxygen Discharge (Final Oxygen Re-Evaluation):   Initial Exercise Prescription:  Initial Exercise Prescription - 08/29/22 1400       Date of Initial Exercise RX and Referring Provider   Date 08/29/22    Referring Provider Kandis Cocking, MD      Oxygen   Maintain Oxygen Saturation 88% or higher      Treadmill   MPH 1.8    Grade 0    Minutes 15    METs 2.38      NuStep   Level 2    SPM 80    Minutes 15    METs 2.67      REL-XR   Level 1    Speed 50    Minutes 15    METs 2.67      Track   Laps 30    Minutes 15    METs 2.63      Prescription Details   Frequency (times per week) 2    Duration Progress to 30 minutes of continuous aerobic without signs/symptoms of physical distress      Intensity   THRR 40-80% of Max Heartrate 102-137    Ratings of Perceived Exertion 11-13    Perceived Dyspnea 0-4      Progression   Progression Continue to progress workloads to maintain intensity without signs/symptoms of physical distress.      Resistance Training   Training Prescription Yes    Weight 2 lb    Reps 10-15             Perform Capillary Blood Glucose checks as needed.  Exercise Prescription Changes:   Exercise Prescription Changes     Row Name 08/29/22 1400             Response to Exercise   Blood Pressure (Admit) 112/62       Blood Pressure (Exercise) 120/64       Blood Pressure (Exit) 110/62       Heart Rate (Admit) 67 bpm       Heart Rate (Exercise) 89 bpm       Heart Rate (Exit) 68 bpm       Oxygen Saturation (Admit) 99 %       Oxygen Saturation (Exercise) 98 %       Rating of Perceived Exertion (Exercise) 9       Perceived Dyspnea (Exercise) 1       Symptoms R leg numbness, Low back pain 7/10        Comments 6MWT Results                Exercise Comments:   Exercise Goals and Review:   Exercise Goals     Row Name 08/29/22 1349             Exercise Goals   Increase Physical Activity Yes       Intervention Provide advice, education, support and counseling about physical activity/exercise needs.;Develop an individualized exercise prescription for aerobic and resistive training based on initial  evaluation findings, risk stratification, comorbidities and participant's personal goals.       Expected Outcomes Short Term: Attend rehab on a regular basis to increase amount of physical activity.;Long Term: Add in home exercise to make exercise part of routine and to increase amount of physical activity.;Long Term: Exercising regularly at least 3-5 days a week.       Increase Strength and Stamina Yes       Intervention Provide advice, education, support and counseling about physical activity/exercise needs.;Develop an individualized exercise prescription for aerobic and resistive training based on initial evaluation findings, risk stratification, comorbidities and participant's personal goals.       Expected Outcomes Short Term: Perform resistance training exercises routinely during rehab and add in resistance training at home;Short Term: Increase workloads from initial exercise prescription for resistance, speed, and METs.;Long Term: Improve cardiorespiratory fitness, muscular endurance and strength as measured by increased METs and functional capacity (6MWT)       Able to understand and use rate of perceived exertion (RPE) scale Yes       Intervention Provide education and explanation on how to use RPE scale       Expected Outcomes Short Term: Able to use RPE daily in rehab to express subjective intensity level;Long Term:  Able to use RPE to guide intensity level when exercising independently       Able to understand and use Dyspnea scale Yes       Intervention Provide education and  explanation on how to use Dyspnea scale       Expected Outcomes Short Term: Able to use Dyspnea scale daily in rehab to express subjective sense of shortness of breath during exertion;Long Term: Able to use Dyspnea scale to guide intensity level when exercising independently       Knowledge and understanding of Target Heart Rate Range (THRR) Yes       Intervention Provide education and explanation of THRR including how the numbers were predicted and where they are located for reference       Expected Outcomes Long Term: Able to use THRR to govern intensity when exercising independently;Short Term: Able to state/look up THRR;Short Term: Able to use daily as guideline for intensity in rehab       Able to check pulse independently Yes       Intervention Review the importance of being able to check your own pulse for safety during independent exercise;Provide education and demonstration on how to check pulse in carotid and radial arteries.       Expected Outcomes Short Term: Able to explain why pulse checking is important during independent exercise;Long Term: Able to check pulse independently and accurately       Understanding of Exercise Prescription Yes       Intervention Provide education, explanation, and written materials on patient's individual exercise prescription       Expected Outcomes Short Term: Able to explain program exercise prescription;Long Term: Able to explain home exercise prescription to exercise independently                Exercise Goals Re-Evaluation :   Discharge Exercise Prescription (Final Exercise Prescription Changes):  Exercise Prescription Changes - 08/29/22 1400       Response to Exercise   Blood Pressure (Admit) 112/62    Blood Pressure (Exercise) 120/64    Blood Pressure (Exit) 110/62    Heart Rate (Admit) 67 bpm    Heart Rate (Exercise) 89 bpm    Heart Rate (Exit)  68 bpm    Oxygen Saturation (Admit) 99 %    Oxygen Saturation (Exercise) 98 %    Rating  of Perceived Exertion (Exercise) 9    Perceived Dyspnea (Exercise) 1    Symptoms R leg numbness, Low back pain 7/10    Comments 6MWT Results             Nutrition:  Target Goals: Understanding of nutrition guidelines, daily intake of sodium '1500mg'$ , cholesterol '200mg'$ , calories 30% from fat and 7% or less from saturated fats, daily to have 5 or more servings of fruits and vegetables.  Education: All About Nutrition: -Group instruction provided by verbal, written material, interactive activities, discussions, models, and posters to present general guidelines for heart healthy nutrition including fat, fiber, MyPlate, the role of sodium in heart healthy nutrition, utilization of the nutrition label, and utilization of this knowledge for meal planning. Follow up email sent as well. Written material given at graduation. Flowsheet Row Cardiac Rehab from 08/29/2022 in [facility] Cardiac and Pulmonary Rehab  Education need identified 08/29/22       Biometrics:  Pre Biometrics - 08/29/22 1350       Pre Biometrics   Height 5' 4.5" (1.638 m)    Weight 153 lb 11.2 oz (69.7 kg)    Waist Circumference 33 inches    Hip Circumference 40 inches    Waist to Hip Ratio 0.83 %    BMI (Calculated) 25.98    Single Leg Stand 3.7 seconds              Nutrition Therapy Plan and Nutrition Goals:  Nutrition Therapy & Goals - 08/29/22 1339       Intervention Plan   Intervention Prescribe, educate and counsel regarding individualized specific dietary modifications aiming towards targeted core components such as weight, hypertension, lipid management, diabetes, heart failure and other comorbidities.    Expected Outcomes Long Term Goal: Adherence to prescribed nutrition plan.;Short Term Goal: Understand basic principles of dietary content, such as calories, fat, sodium, cholesterol and nutrients.;Short Term Goal: A plan has been developed with personal nutrition goals set during dietitian appointment.              Nutrition Assessments:  MEDIFICTS Score Key: ?70 Need to make dietary changes  40-70 Heart Healthy Diet ? 40 Therapeutic Level Cholesterol Diet  Flowsheet Row Cardiac Rehab from 08/29/2022 in [facility] Cardiac and Pulmonary Rehab  Picture Your Plate Total Score on Admission 48      Picture Your Plate Scores: [id number] Unhealthy dietary pattern with much room for improvement. 41-50 Dietary pattern unlikely to meet recommendations for good health and room for improvement. 51-60 More healthful dietary pattern, with some room for improvement.  >60 Healthy dietary pattern, although there may be some specific behaviors that could be improved.    Nutrition Goals Re-Evaluation:   Nutrition Goals Discharge (Final Nutrition Goals Re-Evaluation):   Psychosocial: Target Goals: Acknowledge presence or absence of significant depression and/or stress, maximize coping skills, provide positive support system. Participant is able to verbalize types and ability to use techniques and skills needed for reducing stress and depression.   Education: Stress, Anxiety, and Depression - Group verbal and visual presentation to define topics covered.  Reviews how body is impacted by stress, anxiety, and depression.  Also discusses healthy ways to reduce stress and to treat/manage anxiety and depression.  Written material given at graduation.   Education: Sleep Hygiene -Provides group verbal and written instruction about how sleep can affect your health.  Define sleep hygiene, discuss sleep cycles and impact of sleep habits. Review good sleep hygiene tips.    Initial Review & Psychosocial Screening:  Initial Psych Review & Screening - 08/27/22 1310       Initial Review   Current issues with Current Stress Concerns    Source of Stress Concerns Chronic Illness;Family      Family Dynamics   Good Support System? Yes   sister     Barriers   Psychosocial barriers to participate in program There are no  identifiable barriers or psychosocial needs.;The patient should benefit from training in stress management and relaxation.      Screening Interventions   Interventions Encouraged to exercise;Provide feedback about the scores to participant;To provide support and resources with identified psychosocial needs    Expected Outcomes Short Term goal: Utilizing psychosocial counselor, staff and physician to assist with identification of specific Stressors or current issues interfering with healing process. Setting desired goal for each stressor or current issue identified.;Long Term Goal: Stressors or current issues are controlled or eliminated.;Short Term goal: Identification and review with participant of any Quality of Life or Depression concerns found by scoring the questionnaire.;Long Term goal: The participant improves quality of Life and PHQ9 Scores as seen by post scores and/or verbalization of changes             Quality of Life Scores:   Quality of Life - 08/29/22 1335       Quality of Life   Select Quality of Life      Quality of Life Scores   Health/Function Pre 22.1 %    Socioeconomic Pre 21.19 %    Psych/Spiritual Pre 24.86 %    Family Pre 20.4 %    GLOBAL Pre 22.2 %            Scores of 19 and below usually indicate a poorer quality of life in these areas.  A difference of  2-3 points is a clinically meaningful difference.  A difference of 2-3 points in the total score of the Quality of Life Index has been associated with significant improvement in overall quality of life, self-image, physical symptoms, and general health in studies assessing change in quality of life.  PHQ-9: Review Flowsheet  More data exists      08/29/2022 03/01/2022 12/07/2021 06/22/2021 05/28/2021  Depression screen PHQ 2/9  Decreased Interest '2 2 2 1 1  '$ Down, Depressed, Hopeless '1 1 2 1 1  '$ PHQ - 2 Score '3 3 4 2 2  '$ Altered sleeping '1 3 2 1 3  '$ Tired, decreased energy 3 3 0 1 2  Change in appetite 1  0 0 1 3  Feeling bad or failure about yourself  0 0 2 0 0  Trouble concentrating 0 1 0 1 1  Moving slowly or fidgety/restless 0 0 0 0 1  Suicidal thoughts 0 0 0 0 0  PHQ-9 Score '8 10 8 6 12  '$ Difficult doing work/chores Somewhat difficult Somewhat difficult - Not difficult at all Not difficult at all   Interpretation of Total Score  Total Score Depression Severity:  1-4 = Minimal depression, 5-9 = Mild depression, 10-14 = Moderate depression, 15-19 = Moderately severe depression, 20-27 = Severe depression   Psychosocial Evaluation and Intervention:  Psychosocial Evaluation - 08/27/22 1317       Psychosocial Evaluation & Interventions   Interventions Encouraged to exercise with the program and follow exercise prescription    Comments Anber is coming to cardiac  rehab with the diagnosis of heart failure. She also is currently receiving treatment for multiple myeloma. She does have a copay and does not think she will be able to cover the whole program, but is interested in the CARE program. She mentions she is always under stress when it comes to her health and family, but that she tries to stay positive and takes it one day at a time. Her sister is a huge support and she also loves her 5 cats and 5 dogs.    Expected Outcomes Short: attend cardiac rehab for education and exercise. Long: develop and maintain positive self care habits.    Continue Psychosocial Services  Follow up required by staff             Psychosocial Re-Evaluation:   Psychosocial Discharge (Final Psychosocial Re-Evaluation):   Vocational Rehabilitation: Provide vocational rehab assistance to qualifying candidates.   Vocational Rehab Evaluation & Intervention:  Vocational Rehab - 08/27/22 1310       Initial Vocational Rehab Evaluation & Intervention   Assessment shows need for Vocational Rehabilitation No             Education: Education Goals: Education classes will be provided on a variety of topics  geared toward better understanding of heart health and risk factor modification. Participant will state understanding/return demonstration of topics presented as noted by education test scores.  Learning Barriers/Preferences:  Learning Barriers/Preferences - 08/27/22 1310       Learning Barriers/Preferences   Learning Barriers None    Learning Preferences None             General Cardiac Education Topics:  AED/CPR: - Group verbal and written instruction with the use of models to demonstrate the basic use of the AED with the basic ABC's of resuscitation.   Anatomy and Cardiac Procedures: - Group verbal and visual presentation and models provide information about basic cardiac anatomy and function. Reviews the testing methods done to diagnose heart disease and the outcomes of the test results. Describes the treatment choices: Medical Management, Angioplasty, or Coronary Bypass Surgery for treating various heart conditions including Myocardial Infarction, Angina, Valve Disease, and Cardiac Arrhythmias.  Written material given at graduation. Flowsheet Row Cardiac Rehab from 08/29/2022 in [facility]  Education need identified 08/29/22       Medication Safety: - Group verbal and visual instruction to review commonly prescribed medications for heart and lung disease. Reviews the medication, class of the drug, and side effects. Includes the steps to properly store meds and maintain the prescription regimen.  Written material given at graduation.   Intimacy: - Group verbal instruction through game format to discuss how heart and lung disease can affect sexual intimacy. Written material given at graduation..   Know Your Numbers and Heart Failure: - Group verbal and visual instruction to discuss disease risk factors for cardiac and pulmonary disease and treatment options.  Reviews associated critical values for Overweight/Obesity, Hypertension, Cholesterol, and  Diabetes.  Discusses basics of heart failure: signs/symptoms and treatments.  Introduces Heart Failure Zone chart for action plan for heart failure.  Written material given at graduation.   Infection Prevention: - Provides verbal and written material to individual with discussion of infection control including proper hand washing and proper equipment cleaning during exercise session. Flowsheet Row Cardiac Rehab from 08/29/2022 in [facility] Cardiac [facility] Pul[facility] Rehab  Date 08/29/22  Educator NT  Instruction Review Code 1- Verbalizes Understanding       Falls Prevention: -  Provides verbal and written material to individual with discussion of falls prevention and safety. Flowsheet Row Cardiac Rehab from 08/29/2022 in [facility] Cardiac and Pulmonary Rehab  Date 08/29/22  Educator NT  Instruction Review Code 1- Verbalizes Understanding       Other: -Provides group and verbal instruction on various topics (see comments)   Knowledge Questionnaire Score:  Knowledge Questionnaire Score - 08/29/22 1334       Knowledge Questionnaire Score   Pre Score 23/26             Core Components/Risk Factors/Patient Goals at Admission:  Personal Goals and Risk Factors at Admission - 08/27/22 1311       Core Components/Risk Factors/Patient Goals on Admission   Diabetes Yes    Intervention Provide education about signs/symptoms and action to take for hypo/hyperglycemia.;Provide education about proper nutrition, including hydration, and aerobic/resistive exercise prescription along with prescribed medications to achieve blood glucose in normal ranges: Fasting glucose 65-99 mg/dL    Expected Outcomes Short Term: Participant verbalizes understanding of the signs/symptoms and immediate care of hyper/hypoglycemia, proper foot care and importance of medication, aerobic/resistive exercise and nutrition plan for blood glucose control.;Long Term: Attainment of HbA1C < 7%.    Heart Failure Yes    Intervention  Provide a combined exercise and nutrition program that is supplemented with education, support and counseling about heart failure. Directed toward relieving symptoms such as shortness of breath, decreased exercise tolerance, and extremity edema.    Expected Outcomes Improve functional capacity of life;Short term: Attendance in program 2-3 days a week with increased exercise capacity. Reported lower sodium intake. Reported increased fruit and vegetable intake. Reports medication compliance.;Short term: Daily weights obtained and reported for increase. Utilizing diuretic protocols set by physician.;Long term: Adoption of self-care skills and reduction of barriers for early signs and symptoms recognition and intervention leading to self-care maintenance.    Hypertension Yes    Intervention Provide education on lifestyle modifcations including regular physical activity/exercise, weight management, moderate sodium restriction and increased consumption of fresh fruit, vegetables, and low fat dairy, alcohol moderation, and smoking cessation.;Monitor prescription use compliance.    Expected Outcomes Short Term: Continued assessment and intervention until BP is < 140/93m HG in hypertensive participants. < 130/885mHG in hypertensive participants with diabetes, heart failure or chronic kidney disease.;Long Term: Maintenance of blood pressure at goal levels.    Lipids Yes    Intervention Provide education and support for participant on nutrition & aerobic/resistive exercise along with prescribed medications to achieve LDL '70mg'$ , HDL >'40mg'$ .    Expected Outcomes Short Term: Participant states understanding of desired cholesterol values and is compliant with medications prescribed. Participant is following exercise prescription and nutrition guidelines.;Long Term: Cholesterol controlled with medications as prescribed, with individualized exercise RX and with personalized nutrition plan. Value goals: LDL < '70mg'$ , HDL > 40  mg.             Education:Diabetes - Individual verbal and written instruction to review signs/symptoms of diabetes, desired ranges of glucose level fasting, after meals and with exercise. Acknowledge that pre and post exercise glucose checks will be done for 3 sessions at entry of program. [facility]  Date 08/27/22  [facility] Review Code 1- Verbalizes Understanding       Core Components/Risk Factors/Patient Goals Review:    Core Components/Risk Factors/Patient Goals at Discharge (Final Review):    ITP Comments:  ITP Comments  [place] Name 08/27/22 1329 08/29/22 1244         ITP Comments Initial phone call completed. Diagnosis can be found in [facility] 2/22. EP Orientation scheduled for Thursday 2/29 at 9:30. Completed 6MWT and gym orientation. Initial ITP created and sent for review to Dr. Emily Filbert, Medical Director.               Comments: Initial ITP

## 2022-08-29 NOTE — Patient Instructions (Signed)
Patient Instructions  Patient Details  Name: Sarah [name] MRN: [id number] Date of Birth: 06/09/1956 Referring Provider:  Marjean Donna, MD  Below are your personal goals for exercise, nutrition, and risk factors. Our goal is to help you stay on track towards obtaining and maintaining these goals. We will be discussing your progress on these goals with you throughout the program.  Initial Exercise Prescription:  Initial Exercise Prescription - 08/29/22 1400       Date of Initial Exercise RX and Referring Provider   Date 08/29/22    Referring Provider Kandis Cocking, MD      Oxygen   Maintain Oxygen Saturation 88% or higher      Treadmill   MPH 1.8    Grade 0    Minutes 15    METs 2.38      NuStep   Level 2    SPM 80    Minutes 15    METs 2.67      REL-XR   Level 1    Speed 50    Minutes 15    METs 2.67      Track   Laps 30    Minutes 15    METs 2.63      Prescription Details   Frequency (times per week) 2    Duration Progress to 30 minutes of continuous aerobic without signs/symptoms of physical distress      Intensity   THRR 40-80% of Max Heartrate 102-137    Ratings of Perceived Exertion 11-13    Perceived Dyspnea 0-4      Progression   Progression Continue to progress workloads to maintain intensity without signs/symptoms of physical distress.      Resistance Training   Training Prescription Yes    Weight 2 lb    Reps 10-15             Exercise Goals: Frequency: Be able to perform aerobic exercise two to three times per week in program working toward 2-5 days per week of home exercise.  Intensity: Work with a perceived exertion of 11 (fairly light) - 15 (hard) while following your exercise prescription.  We will make changes to your prescription with you as you progress through the program.   Duration: Be able to do 30 to 45 minutes of continuous aerobic exercise in addition to a 5 minute warm-up and a 5 minute cool-down routine.   Nutrition  Goals: Your personal nutrition goals will be established when you do your nutrition analysis with the dietician.  The following are general nutrition guidelines to follow: Cholesterol < '200mg'$ /day Sodium < '1500mg'$ /day Fiber: Women over 50 yrs - 21 grams per day  Personal Goals:  Personal Goals and Risk Factors at Admission - 08/27/22 1311       Core Components/Risk Factors/Patient Goals on Admission   Diabetes Yes    Intervention Provide education about signs/symptoms and action to take for hypo/hyperglycemia.;Provide education about proper nutrition, including hydration, and aerobic/resistive exercise prescription along with prescribed medications to achieve blood glucose in normal ranges: Fasting glucose 65-99 mg/dL    Expected Outcomes Short Term: Participant verbalizes understanding of the signs/symptoms and immediate care of hyper/hypoglycemia, proper foot care and importance of medication, aerobic/resistive exercise and nutrition plan for blood glucose control.;Long Term: Attainment of HbA1C < 7%.    Heart Failure Yes    Intervention Provide a combined exercise and nutrition program that is supplemented with education, support and counseling about heart failure. Directed toward relieving symptoms such  as shortness of breath, decreased exercise tolerance, and extremity edema.    Expected Outcomes Improve functional capacity of life;Short term: Attendance in program 2-3 days a week with increased exercise capacity. Reported lower sodium intake. Reported increased fruit and vegetable intake. Reports medication compliance.;Short term: Daily weights obtained and reported for increase. Utilizing diuretic protocols set by physician.;Long term: Adoption of self-care skills and reduction of barriers for early signs and symptoms recognition and intervention leading to self-care maintenance.    Hypertension Yes    Intervention Provide education on lifestyle modifcations including regular physical  activity/exercise, weight management, moderate sodium restriction and increased consumption of fresh fruit, vegetables, and low fat dairy, alcohol moderation, and smoking cessation.;Monitor prescription use compliance.    Expected Outcomes Short Term: Continued assessment and intervention until BP is < 140/17m HG in hypertensive participants. < 130/827mHG in hypertensive participants with diabetes, heart failure or chronic kidney disease.;Long Term: Maintenance of blood pressure at goal levels.    Lipids Yes    Intervention Provide education and support for participant on nutrition & aerobic/resistive exercise along with prescribed medications to achieve LDL '70mg'$ , HDL >'40mg'$ .    Expected Outcomes Short Term: Participant states understanding of desired cholesterol values and is compliant with medications prescribed. Participant is following exercise prescription and nutrition guidelines.;Long Term: Cholesterol controlled with medications as prescribed, with individualized exercise RX and with personalized nutrition plan. Value goals: LDL < '70mg'$ , HDL > 40 mg.             Exercise Goals and Review:  Exercise Goals     Row Name 08/29/22 1349             Exercise Goals   Increase Physical Activity Yes       Intervention Provide advice, education, support and counseling about physical activity/exercise needs.;Develop an individualized exercise prescription for aerobic and resistive training based on initial evaluation findings, risk stratification, comorbidities and participant's personal goals.       Expected Outcomes Short Term: Attend rehab on a regular basis to increase amount of physical activity.;Long Term: Add in home exercise to make exercise part of routine and to increase amount of physical activity.;Long Term: Exercising regularly at least 3-5 days a week.       Increase Strength and Stamina Yes       Intervention Provide advice, education, support and counseling about physical  activity/exercise needs.;Develop an individualized exercise prescription for aerobic and resistive training based on initial evaluation findings, risk stratification, comorbidities and participant's personal goals.       Expected Outcomes Short Term: Perform resistance training exercises routinely during rehab and add in resistance training at home;Short Term: Increase workloads from initial exercise prescription for resistance, speed, and METs.;Long Term: Improve cardiorespiratory fitness, muscular endurance and strength as measured by increased METs and functional capacity (6MWT)       Able to understand and use rate of perceived exertion (RPE) scale Yes       Intervention Provide education and explanation on how to use RPE scale       Expected Outcomes Short Term: Able to use RPE daily in rehab to express subjective intensity level;Long Term:  Able to use RPE to guide intensity level when exercising independently       Able to understand and use Dyspnea scale Yes       Intervention Provide education and explanation on how to use Dyspnea scale       Expected Outcomes Short Term: Able to use Dyspnea  scale daily in rehab to express subjective sense of shortness of breath during exertion;Long Term: Able to use Dyspnea scale to guide intensity level when exercising independently       Knowledge and understanding of Target Heart Rate Range (THRR) Yes       Intervention Provide education and explanation of THRR including how the numbers were predicted and where they are located for reference       Expected Outcomes Long Term: Able to use THRR to govern intensity when exercising independently;Short Term: Able to state/look up THRR;Short Term: Able to use daily as guideline for intensity in rehab       Able to check pulse independently Yes       Intervention Review the importance of being able to check your own pulse for safety during independent exercise;Provide education and demonstration on how to check  pulse in carotid and radial arteries.       Expected Outcomes Short Term: Able to explain why pulse checking is important during independent exercise;Long Term: Able to check pulse independently and accurately       Understanding of Exercise Prescription Yes       Intervention Provide education, explanation, and written materials on patient's individual exercise prescription       Expected Outcomes Short Term: Able to explain program exercise prescription;Long Term: Able to explain home exercise prescription to exercise independently

## 2022-09-02 ENCOUNTER — Inpatient Hospital Stay: Payer: Medicare PPO

## 2022-09-02 ENCOUNTER — Inpatient Hospital Stay: Payer: Medicare PPO | Attending: Oncology

## 2022-09-02 DIAGNOSIS — Z8249 Family history of ischemic heart disease and other diseases of the circulatory system: Secondary | ICD-10-CM | POA: Insufficient documentation

## 2022-09-02 DIAGNOSIS — Z8379 Family history of other diseases of the digestive system: Secondary | ICD-10-CM | POA: Insufficient documentation

## 2022-09-02 DIAGNOSIS — G620 Drug-induced polyneuropathy: Secondary | ICD-10-CM | POA: Diagnosis not present

## 2022-09-02 DIAGNOSIS — F32A Depression, unspecified: Secondary | ICD-10-CM | POA: Diagnosis not present

## 2022-09-02 DIAGNOSIS — Z808 Family history of malignant neoplasm of other organs or systems: Secondary | ICD-10-CM | POA: Insufficient documentation

## 2022-09-02 DIAGNOSIS — Z803 Family history of malignant neoplasm of breast: Secondary | ICD-10-CM | POA: Insufficient documentation

## 2022-09-02 DIAGNOSIS — Z818 Family history of other mental and behavioral disorders: Secondary | ICD-10-CM | POA: Insufficient documentation

## 2022-09-02 DIAGNOSIS — E1122 Type 2 diabetes mellitus with diabetic chronic kidney disease: Secondary | ICD-10-CM | POA: Diagnosis not present

## 2022-09-02 DIAGNOSIS — C9001 Multiple myeloma in remission: Secondary | ICD-10-CM | POA: Insufficient documentation

## 2022-09-02 DIAGNOSIS — Z79899 Other long term (current) drug therapy: Secondary | ICD-10-CM | POA: Diagnosis not present

## 2022-09-02 DIAGNOSIS — Z90710 Acquired absence of both cervix and uterus: Secondary | ICD-10-CM | POA: Diagnosis not present

## 2022-09-02 DIAGNOSIS — E538 Deficiency of other specified B group vitamins: Secondary | ICD-10-CM | POA: Insufficient documentation

## 2022-09-02 DIAGNOSIS — T451X5A Adverse effect of antineoplastic and immunosuppressive drugs, initial encounter: Secondary | ICD-10-CM | POA: Diagnosis not present

## 2022-09-02 DIAGNOSIS — Z87891 Personal history of nicotine dependence: Secondary | ICD-10-CM | POA: Diagnosis not present

## 2022-09-02 DIAGNOSIS — Z885 Allergy status to narcotic agent status: Secondary | ICD-10-CM | POA: Insufficient documentation

## 2022-09-02 DIAGNOSIS — Z841 Family history of disorders of kidney and ureter: Secondary | ICD-10-CM | POA: Insufficient documentation

## 2022-09-02 DIAGNOSIS — Z886 Allergy status to analgesic agent status: Secondary | ICD-10-CM | POA: Diagnosis not present

## 2022-09-02 DIAGNOSIS — Z801 Family history of malignant neoplasm of trachea, bronchus and lung: Secondary | ICD-10-CM | POA: Insufficient documentation

## 2022-09-02 DIAGNOSIS — I130 Hypertensive heart and chronic kidney disease with heart failure and stage 1 through stage 4 chronic kidney disease, or unspecified chronic kidney disease: Secondary | ICD-10-CM | POA: Diagnosis not present

## 2022-09-02 DIAGNOSIS — D509 Iron deficiency anemia, unspecified: Secondary | ICD-10-CM | POA: Insufficient documentation

## 2022-09-02 DIAGNOSIS — N1832 Chronic kidney disease, stage 3b: Secondary | ICD-10-CM | POA: Diagnosis not present

## 2022-09-02 DIAGNOSIS — F419 Anxiety disorder, unspecified: Secondary | ICD-10-CM | POA: Insufficient documentation

## 2022-09-02 DIAGNOSIS — R197 Diarrhea, unspecified: Secondary | ICD-10-CM | POA: Diagnosis not present

## 2022-09-02 DIAGNOSIS — C9000 Multiple myeloma not having achieved remission: Secondary | ICD-10-CM

## 2022-09-02 DIAGNOSIS — I429 Cardiomyopathy, unspecified: Secondary | ICD-10-CM | POA: Insufficient documentation

## 2022-09-02 DIAGNOSIS — D801 Nonfamilial hypogammaglobulinemia: Secondary | ICD-10-CM | POA: Insufficient documentation

## 2022-09-02 DIAGNOSIS — Z888 Allergy status to other drugs, medicaments and biological substances status: Secondary | ICD-10-CM | POA: Insufficient documentation

## 2022-09-02 DIAGNOSIS — I5032 Chronic diastolic (congestive) heart failure: Secondary | ICD-10-CM | POA: Diagnosis not present

## 2022-09-02 DIAGNOSIS — K219 Gastro-esophageal reflux disease without esophagitis: Secondary | ICD-10-CM | POA: Diagnosis not present

## 2022-09-02 DIAGNOSIS — I350 Nonrheumatic aortic (valve) stenosis: Secondary | ICD-10-CM | POA: Diagnosis not present

## 2022-09-02 DIAGNOSIS — Z9049 Acquired absence of other specified parts of digestive tract: Secondary | ICD-10-CM | POA: Diagnosis not present

## 2022-09-02 DIAGNOSIS — Z833 Family history of diabetes mellitus: Secondary | ICD-10-CM | POA: Insufficient documentation

## 2022-09-02 DIAGNOSIS — Z9484 Stem cells transplant status: Secondary | ICD-10-CM | POA: Insufficient documentation

## 2022-09-02 DIAGNOSIS — Z800 Family history of malignant neoplasm of digestive organs: Secondary | ICD-10-CM | POA: Insufficient documentation

## 2022-09-02 DIAGNOSIS — Z9481 Bone marrow transplant status: Secondary | ICD-10-CM | POA: Insufficient documentation

## 2022-09-02 LAB — MAGNESIUM: Magnesium: 1.3 mg/dL — ABNORMAL LOW (ref 1.7–2.4)

## 2022-09-02 MED ORDER — HEPARIN SOD (PORK) LOCK FLUSH 100 UNIT/ML IV SOLN
500.0000 [IU] | Freq: Once | INTRAVENOUS | Status: AC | PRN
  Administered 2022-09-02: 500 [IU]
  Filled 2022-09-02: qty 5

## 2022-09-02 MED ORDER — SODIUM CHLORIDE 0.9 % IV SOLN
25.0000 mg | Freq: Once | INTRAVENOUS | Status: AC
  Administered 2022-09-02: 25 mg via INTRAVENOUS
  Filled 2022-09-02: qty 1

## 2022-09-02 MED ORDER — MAGNESIUM SULFATE 2 GM/50ML IV SOLN
2.0000 g | Freq: Once | INTRAVENOUS | Status: AC
  Administered 2022-09-02: 2 g via INTRAVENOUS

## 2022-09-02 MED ORDER — SODIUM CHLORIDE 0.9% FLUSH
10.0000 mL | Freq: Once | INTRAVENOUS | Status: AC | PRN
  Administered 2022-09-02: 10 mL
  Filled 2022-09-02: qty 10

## 2022-09-02 MED ORDER — MAGNESIUM SULFATE 4 GM/100ML IV SOLN
4.0000 g | Freq: Once | INTRAVENOUS | Status: AC
  Administered 2022-09-02: 4 g via INTRAVENOUS

## 2022-09-02 MED ORDER — SODIUM CHLORIDE 0.9 % IV SOLN
Freq: Once | INTRAVENOUS | Status: AC
  Filled 2022-09-02: qty 250

## 2022-09-06 DIAGNOSIS — I493 Ventricular premature depolarization: Secondary | ICD-10-CM | POA: Diagnosis not present

## 2022-09-06 DIAGNOSIS — N189 Chronic kidney disease, unspecified: Secondary | ICD-10-CM | POA: Diagnosis not present

## 2022-09-06 DIAGNOSIS — E1122 Type 2 diabetes mellitus with diabetic chronic kidney disease: Secondary | ICD-10-CM | POA: Diagnosis not present

## 2022-09-06 DIAGNOSIS — C9002 Multiple myeloma in relapse: Secondary | ICD-10-CM | POA: Diagnosis not present

## 2022-09-06 DIAGNOSIS — I420 Dilated cardiomyopathy: Secondary | ICD-10-CM | POA: Diagnosis not present

## 2022-09-06 DIAGNOSIS — M879 Osteonecrosis, unspecified: Secondary | ICD-10-CM | POA: Diagnosis not present

## 2022-09-06 DIAGNOSIS — I5042 Chronic combined systolic (congestive) and diastolic (congestive) heart failure: Secondary | ICD-10-CM | POA: Diagnosis not present

## 2022-09-06 DIAGNOSIS — I428 Other cardiomyopathies: Secondary | ICD-10-CM | POA: Diagnosis not present

## 2022-09-06 DIAGNOSIS — I447 Left bundle-branch block, unspecified: Secondary | ICD-10-CM | POA: Diagnosis not present

## 2022-09-08 DIAGNOSIS — I428 Other cardiomyopathies: Secondary | ICD-10-CM | POA: Diagnosis not present

## 2022-09-08 DIAGNOSIS — I493 Ventricular premature depolarization: Secondary | ICD-10-CM | POA: Diagnosis not present

## 2022-09-11 ENCOUNTER — Encounter: Payer: Self-pay | Admitting: Oncology

## 2022-09-11 ENCOUNTER — Inpatient Hospital Stay: Payer: Medicare PPO

## 2022-09-11 ENCOUNTER — Inpatient Hospital Stay (HOSPITAL_BASED_OUTPATIENT_CLINIC_OR_DEPARTMENT_OTHER): Payer: Medicare PPO | Admitting: Oncology

## 2022-09-11 VITALS — BP 100/54 | Temp 97.9°F | Resp 18 | Wt 156.0 lb

## 2022-09-11 DIAGNOSIS — D509 Iron deficiency anemia, unspecified: Secondary | ICD-10-CM | POA: Diagnosis not present

## 2022-09-11 DIAGNOSIS — D500 Iron deficiency anemia secondary to blood loss (chronic): Secondary | ICD-10-CM

## 2022-09-11 DIAGNOSIS — G629 Polyneuropathy, unspecified: Secondary | ICD-10-CM

## 2022-09-11 DIAGNOSIS — D801 Nonfamilial hypogammaglobulinemia: Secondary | ICD-10-CM | POA: Diagnosis not present

## 2022-09-11 DIAGNOSIS — T451X5A Adverse effect of antineoplastic and immunosuppressive drugs, initial encounter: Secondary | ICD-10-CM | POA: Diagnosis not present

## 2022-09-11 DIAGNOSIS — C9001 Multiple myeloma in remission: Secondary | ICD-10-CM

## 2022-09-11 DIAGNOSIS — R110 Nausea: Secondary | ICD-10-CM | POA: Diagnosis not present

## 2022-09-11 DIAGNOSIS — R197 Diarrhea, unspecified: Secondary | ICD-10-CM | POA: Diagnosis not present

## 2022-09-11 DIAGNOSIS — Z7682 Awaiting organ transplant status: Secondary | ICD-10-CM | POA: Diagnosis not present

## 2022-09-11 DIAGNOSIS — G620 Drug-induced polyneuropathy: Secondary | ICD-10-CM | POA: Diagnosis not present

## 2022-09-11 DIAGNOSIS — I350 Nonrheumatic aortic (valve) stenosis: Secondary | ICD-10-CM | POA: Diagnosis not present

## 2022-09-11 DIAGNOSIS — C9000 Multiple myeloma not having achieved remission: Secondary | ICD-10-CM

## 2022-09-11 DIAGNOSIS — E538 Deficiency of other specified B group vitamins: Secondary | ICD-10-CM | POA: Diagnosis not present

## 2022-09-11 LAB — CBC WITH DIFFERENTIAL/PLATELET
Abs Immature Granulocytes: 0.03 10*3/uL (ref 0.00–0.07)
Basophils Absolute: 0 10*3/uL (ref 0.0–0.1)
Basophils Relative: 0 %
Eosinophils Absolute: 0.1 10*3/uL (ref 0.0–0.5)
Eosinophils Relative: 2 %
HCT: 36.3 % (ref 36.0–46.0)
Hemoglobin: 12 g/dL (ref 12.0–15.0)
Immature Granulocytes: 0 %
Lymphocytes Relative: 21 %
Lymphs Abs: 1.4 10*3/uL (ref 0.7–4.0)
MCH: 28.5 pg (ref 26.0–34.0)
MCHC: 33.1 g/dL (ref 30.0–36.0)
MCV: 86.2 fL (ref 80.0–100.0)
Monocytes Absolute: 0.5 10*3/uL (ref 0.1–1.0)
Monocytes Relative: 7 %
Neutro Abs: 4.9 10*3/uL (ref 1.7–7.7)
Neutrophils Relative %: 70 %
Platelets: 119 10*3/uL — ABNORMAL LOW (ref 150–400)
RBC: 4.21 MIL/uL (ref 3.87–5.11)
RDW: 13.2 % (ref 11.5–15.5)
WBC: 7 10*3/uL (ref 4.0–10.5)
nRBC: 0 % (ref 0.0–0.2)

## 2022-09-11 LAB — IRON AND TIBC
Iron: 57 ug/dL (ref 28–170)
Saturation Ratios: 15 % (ref 10.4–31.8)
TIBC: 372 ug/dL (ref 250–450)
UIBC: 315 ug/dL

## 2022-09-11 LAB — MAGNESIUM: Magnesium: 1.3 mg/dL — ABNORMAL LOW (ref 1.7–2.4)

## 2022-09-11 LAB — COMPREHENSIVE METABOLIC PANEL
ALT: 25 U/L (ref 0–44)
AST: 27 U/L (ref 15–41)
Albumin: 4.1 g/dL (ref 3.5–5.0)
Alkaline Phosphatase: 94 U/L (ref 38–126)
Anion gap: 8 (ref 5–15)
BUN: 33 mg/dL — ABNORMAL HIGH (ref 8–23)
CO2: 20 mmol/L — ABNORMAL LOW (ref 22–32)
Calcium: 8.8 mg/dL — ABNORMAL LOW (ref 8.9–10.3)
Chloride: 107 mmol/L (ref 98–111)
Creatinine, Ser: 1.41 mg/dL — ABNORMAL HIGH (ref 0.44–1.00)
GFR, Estimated: 41 mL/min — ABNORMAL LOW (ref >60)
Glucose, Bld: 148 mg/dL — ABNORMAL HIGH (ref 70–99)
Potassium: 4 mmol/L (ref 3.5–5.1)
Sodium: 135 mmol/L (ref 135–145)
Total Bilirubin: 0.5 mg/dL (ref 0.3–1.2)
Total Protein: 7.4 g/dL (ref 6.5–8.1)

## 2022-09-11 LAB — FERRITIN: Ferritin: 47 ng/mL (ref 11–307)

## 2022-09-11 MED ORDER — SODIUM CHLORIDE 0.9 % IV SOLN
Freq: Once | INTRAVENOUS | Status: AC
  Filled 2022-09-11: qty 250

## 2022-09-11 MED ORDER — SODIUM CHLORIDE 0.9 % IV SOLN: 6.0000 g | Freq: Once | INTRAVENOUS | Status: DC

## 2022-09-11 MED ORDER — SODIUM CHLORIDE 0.9 % IV SOLN
25.0000 mg | Freq: Once | INTRAVENOUS | Status: AC
  Administered 2022-09-11: 25 mg via INTRAVENOUS
  Filled 2022-09-11: qty 1

## 2022-09-11 MED ORDER — PROMETHAZINE HCL 25 MG PO TABS: 25.0000 mg | ORAL_TABLET | Freq: Four times a day (QID) | ORAL | 1 refills | Status: DC | PRN

## 2022-09-11 MED ORDER — MAGNESIUM SULFATE 2 GM/50ML IV SOLN
2.0000 g | Freq: Once | INTRAVENOUS | Status: AC
  Administered 2022-09-11: 2 g via INTRAVENOUS
  Filled 2022-09-11: qty 50

## 2022-09-11 MED ORDER — DIPHENOXYLATE-ATROPINE 2.5-0.025 MG PO TABS: 2.0000 | ORAL_TABLET | Freq: Four times a day (QID) | ORAL | 3 refills | Status: DC | PRN

## 2022-09-11 MED ORDER — HEPARIN SOD (PORK) LOCK FLUSH 100 UNIT/ML IV SOLN
500.0000 [IU] | Freq: Once | INTRAVENOUS | Status: AC | PRN
  Administered 2022-09-11: 500 [IU]
  Filled 2022-09-11: qty 5

## 2022-09-11 MED ORDER — CYANOCOBALAMIN 1000 MCG/ML IJ SOLN
1000.0000 ug | Freq: Once | INTRAMUSCULAR | Status: AC
  Administered 2022-09-11: 1000 ug via INTRAMUSCULAR
  Filled 2022-09-11: qty 1

## 2022-09-11 MED ORDER — GUAIFENESIN-DM 100-10 MG/5ML PO SYRP: 5.0000 mL | ORAL_SOLUTION | ORAL | 0 refills | Status: DC | PRN

## 2022-09-11 MED ORDER — MAGNESIUM SULFATE 4 GM/100ML IV SOLN
4.0000 g | Freq: Once | INTRAVENOUS | Status: AC
  Administered 2022-09-11: 4 g via INTRAVENOUS
  Filled 2022-09-11: qty 100

## 2022-09-11 NOTE — Progress Notes (Signed)
Pt here for follow up. No new concerns. Pt requesting refill on Lomotil and phenergan.

## 2022-09-11 NOTE — Assessment & Plan Note (Addendum)
chronic diarrhea,antidiarrhea PRN.  Recommend patient to follow-up with gastroenterology.

## 2022-09-11 NOTE — Assessment & Plan Note (Signed)
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  Light chain ratio is normal. 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 Repeat immunofixation negative. . Labs reviewed and discussed with patient, stable M protein and stable light chain ratio Today's level is pending.   Daratumumab on hold due to frequent infections despite IVIG Continue acyclovir prophylaxis.   

## 2022-09-11 NOTE — Progress Notes (Signed)
Hematology/Oncology Progress note [phone] [phone]     Chief Complaint: Sarah [name] is a 67 y.o. 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  Light chain ratio is normal. 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 Repeat immunofixation negative. . Labs reviewed and discussed with patient, stable M protein and stable light chain ratio Today's level is pending.   Daratumumab on hold due to frequent infections despite IVIG Continue acyclovir prophylaxis.    Hypomagnesemia #Chronic hypomagnesemia proceed with  IV magnesium today.  Continue weekly magnesium +/- IV magnesium.     Iron deficiency anemia due to chronic blood loss S/p Venofer treatments. Hb has improved. Observation. .    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   Previously on Lyrica and nortriptyline-not effective Neurology recommend Tramadol and may consider higher dose of  Nortriptyline in the future. Follows up with neurology.  Stem cell transplant candidate -[facility]- Post transplant RN coordinator [phone]).  Diarrhea chronic diarrhea,antidiarrhea PRN.  Recommend patient to follow-up with gastroenterology.     Orders Placed This Encounter  Procedures   CBC with Differential ([place] Only)    Standing Status:   Future    Standing Expiration Date:   09/11/2023   CMP ([place] only)    Standing Status:   Future    Standing Expiration Date:   09/11/2023   Kappa/lambda light chains    Standing Status:   Future     Standing Expiration Date:   09/11/2023   Multiple Myeloma Panel (SPEP&IFE w/QIG)    Standing Status:   Future    Standing Expiration Date:   09/11/2023   Magnesium    Standing Status:   Standing    Number of Occurrences:   6    Standing Expiration Date:   09/11/2023    Follow up  Lab Mag weekly x5  + IV mag.  6 weeks flex lab MD IV mag,  same labs  All questions were answered. The patient knows to call the clinic with any problems, questions or concerns.  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   Initial bone marrow revealed 80% plasma cells.  Lambda free light chains were 1340.  She had nephrotic range proteinuria.  She initially underwent induction with RVD.  Revlimid maintenance was discontinued on 01/21/2017 secondary to intolerance.     07/19/2019 Bone marrow aspirate and biopsy on  revealed a normocellular marrow with but increased lambda-restricted plasma cells (9% aspirate, 40% CD138 immunohistochemistry).  Findings were consistent with recurrent plasma cell myeloma.  Flow cytometry revealed no monoclonal B-cell or phenotypically aberrant T-cell population. Cytogenetics were  11, XX (normal).  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    Lambda light chains have been followed: 22.2 (ratio 0.56) on 07/03/2017, 30.8 (ratio 0.78) on 09/02/2017, 36.9 (ratio 0.40) on 10/21/2017, 37.4 (ratio 0.41) on 12/16/2017, 70.7(ratio 0.31)  on  02/17/2018, 64.2 (ratio 0.27) on 04/07/2018, 78.9 (ratio 0.18) on 05/26/2018, 128.8 (ratio 0.17) on 08/06/2018, 181.5 (ratio 0.13) on 10/08/2018, 130.9 (ratio 0.13) on 10/20/2018, 160.7 (ratio 0.10) on 12/09/2018, 236.6 (ratio 0.07) on 02/01/2019, 363.6 (ratio 0.04) on 03/22/2019, 404.8 (ratio 0.04) on 04/05/2019, 420.7 (ratio 0.03) on 05/24/2019, 573.4 (ratio 0.03) on 06/23/2019, 451.05 (ratio 0.02) on 08/20/2019, 47.2 (ratio 0.13) on 10/25/2019, 22.4 (ratio 0.21) on 11/25/2019, 16.5 (ratio 0.33) on 01/10/2020, 14.6 (ratio 0.34) on 02/07/2020, 13.1 (ratio 0.31) on 03/07/2020, 10.1 (ratio 0.38) on 04/10/2020, and 9.5 (ratio 0.21) on 06/19/2020.   24 hour UPEP on 06/03/2019 revealed kappa free light chains 95.76, lambda free light chains 1,260.71, and ratio 0.08.  24 hour UPEP on 08/23/2019 revealed total protein of 782 mg/24 hrs with lambda free light chains 1,084.16 mg/L and ratio of 0.10 (1.03-31.76).  M spike in urine was 46.1% (361 mg/24 hrs).    Bone survey on 04/08/2016 and 05/28/2017 revealed no definite lytic lesion seen in the visualized skeleton.  Bone survey on 11/19/2018 revealed no suspicious lucent lesions and no acute bony abnormality.  PET scan on 07/12/2019 revealed no focal metabolic activity to suggest active myeloma within the skeleton. There were no lytic lesions identified on the CT portion of the exam or soft tissue plasmacytomas. There was no evidence of multiple myeloma.    Pretreatment RBC phenotype on 09/23/2019 was positive for C, e, DUFFY B, KIDD B, M, S, and s antigen; negative for c, E, KELL, DUFFY A, KIDD A, and N antigen.    09/27/2019 - 10/25/2019; 12/09/2019 - 03/13/2020 6 cycles of daratumumab and hyaluronidase-fihj, Pomalyst, and Decadron (DPd) .  Cycle #1 was complicated by fever and neutropenia requiring admission.  Cycle #2 was complicated by pneumonia requiring admission.  Cycle #6 was complicated with an ER evaluation for an elevated lactic acid.   05/10/2020  second autologous stem cell transplant at [facility]   She underwent conditioning with melphalan 140 mg/m2.    She is s/p week #3 Velcade maintenance (began 08/31/2020 - 09/28/2020).  She has no increase in neuropathy.  She has severe aortic stenosis.  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 Cardiology is following for a possible TAVR in the future.  Echo on 07/05/2020 revealed moderate to severe aortic stenosis with an EF of 50-55%.  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 Zometa was discontinued in 01/2017.  She has chronic nausea on Phenergan.     B12 deficiency.  B12 was 254 on 04/09/2017, 295 on 08/20/2018, and 391 on 10/08/2018.  She was on oral B12.  She received B12 monthly (last 09/14/2020).  Folate was 12.1 on 01/10/2020.    She has iron deficiency.  Ferritin was 32 on 07/01/2019.  She received Venofer on 07/15/2019 and 07/22/2019.   She has hypogammaglobulinemia.  IgG was 245 on 11/25/2019.  She received monthly IVIG (07/22/021 - 03/22/2020).  She received IVIG 400 mg/kg on 01/20/2020, 200 mg/kg on 02/17/2020, and 300 mg/kg on 03/22/2020.  IVIG on [id number] was complicated by acute renal failure. IgG trough level was 418 on 02/17/2020.  10/15/2019 - 10/20/2019 She was admitted  to [facility] with fever and neutropenia.  Cultures were negative.  CXR was negative. She received broad spectrum antibiotics and daily Granix.  She received IVF for acute renal insufficiency due to diarrhea and dehydration.  Creatine was 1.66 on admission and 1.12 on discharge.    03/01/2020 - 03/05/2020 admitted to [facility] from with fever and neutropenia. CXR revealed no active cardiopulmonary disease. Chest CT with contrast revealed no acute intrathoracic pathology. There were findings which could be suggestive of prior granulomatous disease. She was treated with Cefepime and Vancomycin, then switched to ciprofloxacin on  03/03/2020.   05/08/2020 - 12/05/2021The patient was admitted to [facility] from  for autologous bone marrow transplant.   She received conditioning with melphalan 140 mg/m2.  Bone marrow transplant was on 05/10/2020. The patient developed fevers daily from 05/19/2020 - 05/24/2020. Blood cultures were + for strept sanguis bacteremia.  She was treated cefepime, vancomycin, and solumedrol for possible engraftment syndrome. Cefepime was switched to ceftriaxone; she completed 2 weeks of ceftriaxone on 06/03/2020. She had chemotherapy induced diarrhea.  She experienced urinary retention.  Feb 2022 patient has been on maintenance Velcade 2 mg every 2 weeks.  Chronic diarrhea seen by gastroenterology Dr. Vicente Males and was recommended to avoid artificial sugars in her diet including Diet Coke and coffee mate.  She feels some improvement of her diarrhea frequency since the dietary modification.  GI profile PCR negative, C. difficile toxin A+ B negative, fecal calprotectin 77 12/19/2020 colonoscopy showed 2 subcentimeter polyps in the ascending colon, resected and removed.  Pathology showed tubular adenoma.  No malignancy.  Normal mucosa in the entire examined colon.  Biopsied, pathology showed colonic mucosa with no significant pathological alteration.  Negative for active inflammation and features of chronicity.  Negative for microscopic colitis, dysplasia, and malignanc  Diabetes, patient has discontinued metformin due to diarrhea and started on glipizide 2.5 mg   03/05/2021-03/09/2021 Patient was hospitalized due to fever and chills, nonproductive cough, shortness of breath. X-ray showed right lower lobe atelectasis versus pneumonia.  Blood cultures negative.  Urine culture showed 60,000 colonies of Enterococcus facialis.  Patient received antibiotics. Patient also had abdominal pelvis CT scan for work-up of intermittent lower abdomen pain.No acute abnormality.   05/14/21 last dose of Velcade maintenance.  establish  care with neurology Dr. Gurney Maxin and her neuropathy regimen has been switched from gabapentin to Lyrica. 05/29/2021 patient got influenza   neuropathy medication has been switched from gabapentin/Lyrica to nortriptyline.  08/18/2021 Hospitalized due to pneumonia from metapnuemovirus, hospitalization was complicated with NSTEMI. She received heparin gtt.  Treated with antibiotics for coverage of pneumonia,  diuretics for pulmonary edema. She received IVIG '400mg'$ /kg x 1 for hypoglobulinemia. Discharged on 08/25/21 with Augmentin and Zithromax.   May 2023, Daratumumab being held for [facility] infectious disease physician Dr. Fatima Sanger due to frequent infection.  Diagnosed with CHF [HFrEF] -NYHA class II-III, she follows up with cardiology, started on entresto  hospitalization due to CHF exacerbation, community acquired pneumonia.   Presented to emergency room on 07/01/2022 Respiratory panel RT-PCR negative for COVID-19, influenza and RSV.   INTERVAL HISTORY Sarah [name] is a 67 y.o. female who has above history reviewed by me today presents for follow up visit for management of multiple myeloma.  Patient has been on weekly magnesium infusion as needed hypomagnesemia + CHF [nonischemic cardiomyopathy], aortic stenosis, follows up with cardiology. Patient denies any recent infection.   Past Medical History:  Diagnosis Date   Anemia    Anxiety    Aortic  stenosis    a. 05/2020 Echo: EF 45-50%, sev AS - seen by TAVR team @ [facility] - CTA sugg of tricuspid valve w/ fusing of 2 leaflets-TAVR deferred in setting of acute infxn; b. 07/2020 Echo: EF 50-55%, mod-sev AS; c. 12/2020 Echo: EF>55%. Mod-sev paradoxical low-flow low-gradient AS; d. 08/2021 Echo: EF 35-40%, severe AS, triv AI, mild MR.   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 Variable EF over time; b. 08/2017 Echo: EF 40%; b.  03/2020 Echo: EF 55-60%; c. 05/2020 Echo: EF 45-50%; d. 07/2020 Echo: EF 50-55%; e. 12/2020 Echo: EF>55%; f. 08/2021 Echo: EF 35-40%.   Chronic heart failure with preserved ejection fraction (HFpEF) ([place])    a. Variable EF over time; b. 08/2017 Echo: EF 40%; b. 03/2020 Echo: EF 55-60%; c. 05/2020 Echo: EF 45-50%; d. 07/2020 Echo: EF 50-55%; e. 12/2020 Echo: EF>55%; f. 08/2021 Echo: EF 35-40%, no RV, mildly dil LA, mild MR, triv AI, severe AS.   CKD (chronic kidney disease) stage 3, GFR 30-59 ml/min (HCC)    Depression    Diabetes mellitus (HCC)    Dizziness    Fatty liver    Frequent falls    GERD (gastroesophageal reflux disease)    Gout    Heart murmur    History of blood transfusion    History of bone marrow transplant ([place])    History of uterine fibroid    Hx of cardiac catheterization    a. 01/2016 Cath [facility] - after abnl nuc): Nl cors.   Hypertension    Hypomagnesemia    IDA (iron deficiency anemia)    Multiple myeloma (HCC)    Personal history of chemotherapy    PSVT (paroxysmal supraventricular tachycardia)    a. S/p [facility]).   PVC's (premature ventricular contractions)    a. Well-managed w/ bisoprolol in outpt setting.   Renal cyst     Past Surgical History:  Procedure Laterality Date   ABDOMINAL HYSTERECTOMY     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   CARDIAC ELECTROPHYSIOLOGY MAPPING AND ABLATION     CARPAL TUNNEL RELEASE Bilateral    CHOLECYSTECTOMY  2008   COLONOSCOPY WITH PROPOFOL N/A 05/07/2017   Procedure: COLONOSCOPY WITH PROPOFOL;  Surgeon: [name], MD;  Location: [facility] ENDOSCOPY;  Service: Gastroenterology;  Laterality: N/A;   COLONOSCOPY WITH PROPOFOL N/A 12/19/2020   Procedure: COLONOSCOPY WITH PROPOFOL;  Surgeon: [name], MD;  Location: [facility] ENDOSCOPY;  Service: Gastroenterology;  Laterality: N/A;   ESOPHAGOGASTRODUODENOSCOPY (EGD) WITH PROPOFOL N/A 05/07/2017   Procedure: ESOPHAGOGASTRODUODENOSCOPY (EGD) WITH PROPOFOL;  Surgeon: [name], MD;   Location: [facility] ENDOSCOPY;  Service: Gastroenterology;  Laterality: N/A;   FOOT SURGERY Bilateral    INCONTINENCE SURGERY  2009   INTERSTIM IMPLANT PLACEMENT     other     over active bladder   OTHER SURGICAL HISTORY     bladder stimulator    PARTIAL HYSTERECTOMY  03/1996   fibroids   PORTA CATH INSERTION N/A 03/10/2019   Procedure: PORTA CATH INSERTION;  Surgeon: Algernon Huxley, MD;  Location: [place] CV LAB;  Service: Cardiovascular;  Laterality: N/A;   TONSILLECTOMY  2007    Family History  Problem Relation Age of Onset   Colon cancer Father    Renal Disease Father    Diabetes Mellitus II Father    Melanoma Paternal Grandmother  Breast cancer Maternal Aunt 33   Anemia Mother    Heart disease Mother    Heart failure Mother    Renal Disease Mother    Congestive Heart Failure Mother    Heart disease Maternal Uncle    Throat cancer Maternal Uncle    Lung cancer Maternal Uncle    Liver disease Maternal Uncle    Heart failure Maternal Uncle    Hearing loss Son 84       Suicide     Social History:  reports that she quit smoking about 31 years ago. Her smoking use included cigarettes. She has a 20.00 pack-year smoking history. She has never used smokeless tobacco. She reports current alcohol use of about 2.0 standard drinks of alcohol per week. She reports that she does not use drugs.  She is on disability. She notes exposure to perchloroethylene [facility]).   Allergies:  Allergies  Allergen Reactions   Oxycodone-Acetaminophen Anaphylaxis    Swelling and rash   Celebrex [Celecoxib] Diarrhea   Codeine    Plerixafor     In 2016 during ASCT collection patient developed fever to 103.27F and required hospitalization   Benadryl [Diphenhydramine] Palpitations   Morphine Itching and Rash   Ondansetron Diarrhea   Tylenol [Acetaminophen] Itching and Rash    Current Medications: Current Outpatient Medications  Medication Sig Dispense Refill   allopurinol (ZYLOPRIM) 100 MG  tablet TAKE 1 TABLET(100 MG) BY MOUTH DAILY as needed 90 tablet 1   amiodarone (PACERONE) 200 MG tablet Take 200 mg by mouth daily. Take 2 tabs daily for 3 weeks and then 1 tab daily     atorvastatin (LIPITOR) 10 MG tablet Take 0.5 tablets (5 mg total) by mouth daily. 15 tablet 11   bisoprolol (ZEBETA) 10 MG tablet Take 1 tablet (10 mg total) by mouth daily. 90 tablet 1   calcium carbonate (OS-CAL) 1250 (500 Ca) MG chewable tablet Chew 2 tablets by mouth daily.     diclofenac sodium (VOLTAREN) 1 % GEL Apply 2 g topically 4 (four) times daily. 100 g 1   empagliflozin (JARDIANCE) 10 MG TABS tablet Take 1 tablet by mouth daily.     ENTRESTO 24-26 MG Take 1 tablet by mouth 2 (two) times daily.     furosemide (LASIX) 40 MG tablet Take 0.5 tablets (20 mg total) by mouth daily. Home dose.     glipiZIDE (GLUCOTROL XL) 2.5 MG 24 hr tablet Take 1 tablet (2.5 mg total) by mouth daily with breakfast. 30 tablet 0   glucose blood test strip      Multiple Vitamin (MULTIVITAMIN) tablet Take 1 tablet by mouth daily.     omeprazole (PRILOSEC) 40 MG capsule TAKE 1 CAPSULE IN THE MORNING AND TAKE 1 CAPSULE AT BEDTIME 180 capsule 1   pentoxifylline (TRENTAL) 400 MG CR tablet Take 400 mg by mouth 3 (three) times daily with meals.     potassium chloride SA (KLOR-CON M) 20 MEQ tablet Take 1 tablet (20 mEq total) by mouth daily. Home med, reduced from twice daily to daily.  0   spironolactone (ALDACTONE) 25 MG tablet Take by mouth.     tiZANidine (ZANAFLEX) 4 MG tablet Take 1 tablet (4 mg total) by mouth every 6 (six) hours as needed for muscle spasms. 90 tablet 0   traMADol (ULTRAM) 50 MG tablet      traZODone (DESYREL) 100 MG tablet TAKE 1 TABLET AT BEDTIME 90 tablet 3   vitamin E 45 MG (100 UNITS) capsule Take  by mouth daily.     diphenoxylate-atropine (LOMOTIL) 2.5-0.025 MG tablet Take 2 tablets by mouth 4 (four) times daily as needed for diarrhea or loose stools. 180 tablet 3   DULoxetine (CYMBALTA) 60 MG capsule  TAKE 1 CAPSULE(60 MG) BY MOUTH DAILY (Patient not taking: Reported on 08/27/2022) 90 capsule 0   FLUoxetine (PROZAC) 40 MG capsule TAKE 1 CAPSULE EVERY DAY (Patient not taking: Reported on 08/27/2022) 90 capsule 0   guaiFENesin-dextromethorphan (ROBITUSSIN DM) 100-10 MG/5ML syrup Take 5 mLs by mouth every 4 (four) hours as needed for cough. 118 mL 0   lidocaine (XYLOCAINE) 2 % solution Use as directed 15 mLs in the mouth or throat every 3 (three) hours as needed for mouth pain (swish and spit). (Patient not taking: Reported on 08/12/2022) 100 mL 0   magnesium chloride (SLOW-MAG) 64 MG TBEC SR tablet Take 1 tablet (64 mg total) by mouth daily. (Patient not taking: Reported on 08/12/2022) 60 tablet 1   montelukast (SINGULAIR) 10 MG tablet Take 1 tablet (10 mg total) by mouth See admin instructions. Take 1 tab daily for 2 days after daratumumab treatments (Patient not taking: Reported on 09/11/2022) 30 tablet 1   promethazine (PHENERGAN) 25 MG tablet Take 1 tablet (25 mg total) by mouth every 6 (six) hours as needed for nausea or vomiting. 90 tablet 1   No current facility-administered medications for this visit.   Facility-Administered Medications Ordered in Other Visits  Medication Dose Route Frequency Provider Last Rate Last Admin   sodium chloride flush (NS) 0.9 % injection 10 mL  10 mL Intravenous PRN Earlie Server, MD   10 mL at 04/02/21 [id number]    Review of Systems  Constitutional:  Negative for chills, fever, malaise/fatigue and weight loss.  HENT:  Negative for sore throat.   Eyes:  Negative for redness.  Respiratory:  Negative for cough, shortness of breath and wheezing.   Cardiovascular:  Negative for chest pain, palpitations and leg swelling.  Gastrointestinal:  Positive for diarrhea. Negative for abdominal pain, blood in stool, nausea and vomiting.  Genitourinary:  Negative for dysuria.  Musculoskeletal:  Positive for back pain and joint pain. Negative for myalgias.  Skin:  Negative for rash.   Neurological:  Positive for tingling and sensory change. Negative for dizziness and tremors.  Endo/Heme/Allergies:  Does not bruise/bleed easily.  Psychiatric/Behavioral:  Negative for hallucinations.     Performance status (ECOG): 1  Vitals Blood pressure (!) 100/54, temperature 97.9 F (36.6 C), resp. rate 18, weight 156 lb (70.8 kg).  Physical Exam Constitutional:      General: She is not in acute distress.    Appearance: She is not diaphoretic.  HENT:     Head: Normocephalic and atraumatic.     Nose: Nose normal.     Mouth/Throat:     Pharynx: No oropharyngeal exudate.  Eyes:     General: No scleral icterus.    Pupils: Pupils are equal, round, and reactive to light.  Cardiovascular:     Rate and Rhythm: Normal rate.     Heart sounds: Murmur heard.  Pulmonary:     Effort: Pulmonary effort is normal. No respiratory distress.     Breath sounds: No rales.  Chest:     Chest wall: No tenderness.  Abdominal:     General: There is no distension.     Palpations: Abdomen is soft.     Tenderness: There is no abdominal tenderness.  Musculoskeletal:        General:  Normal range of motion.     Cervical back: Normal range of motion and neck supple.  Skin:    General: Skin is warm and dry.     Findings: No erythema.  Neurological:     Mental Status: She is alert and oriented to person, place, and time.     Cranial Nerves: No cranial nerve deficit.     Motor: No abnormal muscle tone.     Coordination: Coordination normal.  Psychiatric:        Mood and Affect: Mood and affect normal.      Laboratory data    Latest Ref Rng & Units 09/11/2022   10:06 AM 08/12/2022    9:01 AM 07/22/2022    1:32 PM  CBC  WBC 4.0 - 10.5 K/uL 7.0  4.6  7.2   Hemoglobin 12.0 - 15.0 g/dL 12.0  11.4  12.9   Hematocrit 36.0 - 46.0 % 36.3  34.5  39.3   Platelets 150 - 400 K/uL 119  111  141       Latest Ref Rng & Units 09/11/2022   10:06 AM 08/12/2022    9:01 AM 07/29/2022    8:47 AM  CMP   Glucose 70 - 99 mg/dL 148  179  155   BUN 8 - 23 mg/dL 33  31  23   Creatinine 0.44 - 1.00 mg/dL 1.41  1.19  1.20   Sodium 135 - 145 mmol/L 135  136  136   Potassium 3.5 - 5.1 mmol/L 4.0  4.3  4.3   Chloride 98 - 111 mmol/L 107  103  105   CO2 22 - 32 mmol/L '20  24  21   '$ Calcium 8.9 - 10.3 mg/dL 8.8  8.4  9.4   Total Protein 6.5 - 8.1 g/dL 7.4  6.9    Total Bilirubin 0.3 - 1.2 mg/dL 0.5  0.4    Alkaline Phos 38 - 126 U/L 94  90    AST 15 - 41 U/L 27  29    ALT 0 - 44 U/L 25  22

## 2022-09-11 NOTE — Assessment & Plan Note (Signed)
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   Previously on Lyrica and nortriptyline-not effective Neurology recommend Tramadol and may consider higher dose of  Nortriptyline in the future. Follows up with neurology. 

## 2022-09-11 NOTE — Assessment & Plan Note (Addendum)
S/p Venofer treatments. Hb has improved. Observation. Marland Kitchen

## 2022-09-11 NOTE — Assessment & Plan Note (Signed)
-  [facility]- Post transplant RN coordinator ([phone]). 

## 2022-09-11 NOTE — Assessment & Plan Note (Signed)
#  Chronic hypomagnesemia proceed with  IV magnesium today.  Continue weekly magnesium +/- IV magnesium.    

## 2022-09-12 ENCOUNTER — Encounter: Payer: Self-pay | Admitting: Oncology

## 2022-09-12 LAB — KAPPA/LAMBDA LIGHT CHAINS
Kappa free light chain: 28.8 mg/L — ABNORMAL HIGH (ref 3.3–19.4)
Kappa, lambda light chain ratio: 1.06 (ref 0.26–1.65)
Lambda free light chains: 27.2 mg/L — ABNORMAL HIGH (ref 5.7–26.3)

## 2022-09-13 ENCOUNTER — Telehealth: Payer: Self-pay

## 2022-09-13 ENCOUNTER — Encounter: Payer: Self-pay | Admitting: Oncology

## 2022-09-13 NOTE — Telephone Encounter (Signed)
Medication approved until 07/01/23. Pharmacy notified

## 2022-09-13 NOTE — Telephone Encounter (Signed)
-----   Message from Secundino Ginger sent at 09/13/2022  8:04 AM EDT ----- Regarding: [place] sent to her chart.

## 2022-09-13 NOTE — Telephone Encounter (Signed)
PA for Diphenoxylate-Atropine 2.5-0.025MG  tablets has been submitted via covermymeds:   Key: [id number] PA Caes ID: [id number] Rx: [id number]

## 2022-09-16 ENCOUNTER — Inpatient Hospital Stay: Payer: Medicare PPO

## 2022-09-16 VITALS — BP 124/76 | HR 62 | Temp 97.4°F | Resp 20

## 2022-09-16 DIAGNOSIS — C9001 Multiple myeloma in remission: Secondary | ICD-10-CM

## 2022-09-16 DIAGNOSIS — I350 Nonrheumatic aortic (valve) stenosis: Secondary | ICD-10-CM | POA: Diagnosis not present

## 2022-09-16 DIAGNOSIS — C9000 Multiple myeloma not having achieved remission: Secondary | ICD-10-CM

## 2022-09-16 DIAGNOSIS — G620 Drug-induced polyneuropathy: Secondary | ICD-10-CM | POA: Diagnosis not present

## 2022-09-16 DIAGNOSIS — D801 Nonfamilial hypogammaglobulinemia: Secondary | ICD-10-CM | POA: Diagnosis not present

## 2022-09-16 DIAGNOSIS — D509 Iron deficiency anemia, unspecified: Secondary | ICD-10-CM | POA: Diagnosis not present

## 2022-09-16 DIAGNOSIS — R197 Diarrhea, unspecified: Secondary | ICD-10-CM | POA: Diagnosis not present

## 2022-09-16 DIAGNOSIS — E538 Deficiency of other specified B group vitamins: Secondary | ICD-10-CM | POA: Diagnosis not present

## 2022-09-16 DIAGNOSIS — T451X5A Adverse effect of antineoplastic and immunosuppressive drugs, initial encounter: Secondary | ICD-10-CM | POA: Diagnosis not present

## 2022-09-16 LAB — MAGNESIUM: Magnesium: 1.4 mg/dL — ABNORMAL LOW (ref 1.7–2.4)

## 2022-09-16 MED ORDER — SODIUM CHLORIDE 0.9 % IV SOLN
Freq: Once | INTRAVENOUS | Status: AC
  Filled 2022-09-16: qty 250

## 2022-09-16 MED ORDER — SODIUM CHLORIDE 0.9 % IV SOLN
25.0000 mg | Freq: Once | INTRAVENOUS | Status: AC
  Administered 2022-09-16: 25 mg via INTRAVENOUS
  Filled 2022-09-16: qty 1

## 2022-09-16 MED ORDER — HEPARIN SOD (PORK) LOCK FLUSH 100 UNIT/ML IV SOLN
500.0000 [IU] | Freq: Once | INTRAVENOUS | Status: AC | PRN
  Administered 2022-09-16: 500 [IU]
  Filled 2022-09-16: qty 5

## 2022-09-16 MED ORDER — SODIUM CHLORIDE 0.9% FLUSH
10.0000 mL | Freq: Once | INTRAVENOUS | Status: AC | PRN
  Administered 2022-09-16: 10 mL
  Filled 2022-09-16: qty 10

## 2022-09-16 MED ORDER — MAGNESIUM SULFATE 4 GM/100ML IV SOLN
4.0000 g | Freq: Once | INTRAVENOUS | Status: AC
  Administered 2022-09-16: 4 g via INTRAVENOUS
  Filled 2022-09-16: qty 100

## 2022-09-16 NOTE — Telephone Encounter (Signed)
Ok to move lab/ mag to 4/16. Thanks for checking.

## 2022-09-17 DIAGNOSIS — R202 Paresthesia of skin: Secondary | ICD-10-CM | POA: Diagnosis not present

## 2022-09-17 DIAGNOSIS — R200 Anesthesia of skin: Secondary | ICD-10-CM | POA: Diagnosis not present

## 2022-09-17 DIAGNOSIS — M545 Low back pain, unspecified: Secondary | ICD-10-CM | POA: Diagnosis not present

## 2022-09-17 DIAGNOSIS — G620 Drug-induced polyneuropathy: Secondary | ICD-10-CM | POA: Diagnosis not present

## 2022-09-17 DIAGNOSIS — T451X5A Adverse effect of antineoplastic and immunosuppressive drugs, initial encounter: Secondary | ICD-10-CM | POA: Diagnosis not present

## 2022-09-17 DIAGNOSIS — M542 Cervicalgia: Secondary | ICD-10-CM | POA: Diagnosis not present

## 2022-09-17 LAB — MULTIPLE MYELOMA PANEL, SERUM
Albumin SerPl Elph-Mcnc: 4 g/dL (ref 2.9–4.4)
Albumin/Glob SerPl: 1.4 (ref 0.7–1.7)
Alpha 1: 0.2 g/dL (ref 0.0–0.4)
Alpha2 Glob SerPl Elph-Mcnc: 0.9 g/dL (ref 0.4–1.0)
B-Globulin SerPl Elph-Mcnc: 0.9 g/dL (ref 0.7–1.3)
Gamma Glob SerPl Elph-Mcnc: 0.8 g/dL (ref 0.4–1.8)
Globulin, Total: 2.9 g/dL (ref 2.2–3.9)
IgA: 214 mg/dL (ref 87–352)
IgG (Immunoglobin G), Serum: 903 mg/dL (ref 586–1602)
IgM (Immunoglobulin M), Srm: 35 mg/dL (ref 26–217)
Total Protein ELP: 6.9 g/dL (ref 6.0–8.5)

## 2022-09-18 ENCOUNTER — Encounter: Payer: Self-pay | Admitting: *Deleted

## 2022-09-18 DIAGNOSIS — I5042 Chronic combined systolic (congestive) and diastolic (congestive) heart failure: Secondary | ICD-10-CM

## 2022-09-18 NOTE — Progress Notes (Signed)
Cardiac Individual Treatment Plan  Patient Details  Name: Sarah [name] MRN: [id number] Date of Birth: 1956/05/09 Referring Provider:   Flowsheet Row Cardiac Rehab from 08/29/2022 in [facility] Cardiac and Pulmonary Rehab  Referring Provider Kandis Cocking, MD       Initial Encounter Date:  Flowsheet Row Cardiac Rehab from 08/29/2022 in [facility] Cardiac and Pulmonary Rehab  Date 08/29/22       Visit Diagnosis: Heart failure, systolic and diastolic, chronic ([place])  Patient's Home Medications on Admission:  Current Outpatient Medications:    allopurinol (ZYLOPRIM) 100 MG tablet, TAKE 1 TABLET(100 MG) BY MOUTH DAILY as needed, Disp: 90 tablet, Rfl: 1   amiodarone (PACERONE) 200 MG tablet, Take 200 mg by mouth daily. Take 2 tabs daily for 3 weeks and then 1 tab daily, Disp: , Rfl:    atorvastatin (LIPITOR) 10 MG tablet, Take 0.5 tablets (5 mg total) by mouth daily., Disp: 15 tablet, Rfl: 11   bisoprolol (ZEBETA) 10 MG tablet, Take 1 tablet (10 mg total) by mouth daily., Disp: 90 tablet, Rfl: 1   calcium carbonate (OS-CAL) 1250 (500 Ca) MG chewable tablet, Chew 2 tablets by mouth daily., Disp: , Rfl:    diclofenac sodium (VOLTAREN) 1 % GEL, Apply 2 g topically 4 (four) times daily., Disp: 100 g, Rfl: 1   diphenoxylate-atropine (LOMOTIL) 2.5-0.025 MG tablet, Take 2 tablets by mouth 4 (four) times daily as needed for diarrhea or loose stools., Disp: 180 tablet, Rfl: 3   DULoxetine (CYMBALTA) 60 MG capsule, TAKE 1 CAPSULE(60 MG) BY MOUTH DAILY (Patient not taking: Reported on 08/27/2022), Disp: 90 capsule, Rfl: 0   empagliflozin (JARDIANCE) 10 MG TABS tablet, Take 1 tablet by mouth daily., Disp: , Rfl:    ENTRESTO 24-26 MG, Take 1 tablet by mouth 2 (two) times daily., Disp: , Rfl:    FLUoxetine (PROZAC) 40 MG capsule, TAKE 1 CAPSULE EVERY DAY (Patient not taking: Reported on 08/27/2022), Disp: 90 capsule, Rfl: 0   furosemide (LASIX) 40 MG tablet, Take 0.5 tablets (20 mg total) by mouth daily. Home dose.,  Disp: , Rfl:    glipiZIDE (GLUCOTROL XL) 2.5 MG 24 hr tablet, Take 1 tablet (2.5 mg total) by mouth daily with breakfast., Disp: 30 tablet, Rfl: 0   glucose blood test strip, , Disp: , Rfl:    guaiFENesin-dextromethorphan (ROBITUSSIN DM) 100-10 MG/5ML syrup, Take 5 mLs by mouth every 4 (four) hours as needed for cough., Disp: 118 mL, Rfl: 0   lidocaine (XYLOCAINE) 2 % solution, Use as directed 15 mLs in the mouth or throat every 3 (three) hours as needed for mouth pain (swish and spit). (Patient not taking: Reported on 08/12/2022), Disp: 100 mL, Rfl: 0   magnesium chloride (SLOW-MAG) 64 MG TBEC SR tablet, Take 1 tablet (64 mg total) by mouth daily. (Patient not taking: Reported on 08/12/2022), Disp: 60 tablet, Rfl: 1   montelukast (SINGULAIR) 10 MG tablet, Take 1 tablet (10 mg total) by mouth See admin instructions. Take 1 tab daily for 2 days after daratumumab treatments (Patient not taking: Reported on 09/11/2022), Disp: 30 tablet, Rfl: 1   Multiple Vitamin (MULTIVITAMIN) tablet, Take 1 tablet by mouth daily., Disp: , Rfl:    omeprazole (PRILOSEC) 40 MG capsule, TAKE 1 CAPSULE IN THE MORNING AND TAKE 1 CAPSULE AT BEDTIME, Disp: 180 capsule, Rfl: 1   pentoxifylline (TRENTAL) 400 MG CR tablet, Take 400 mg by mouth 3 (three) times daily with meals., Disp: , Rfl:    potassium chloride SA (KLOR-CON  M) 20 MEQ tablet, Take 1 tablet (20 mEq total) by mouth daily. Home med, reduced from twice daily to daily., Disp: , Rfl: 0   promethazine (PHENERGAN) 25 MG tablet, Take 1 tablet (25 mg total) by mouth every 6 (six) hours as needed for nausea or vomiting., Disp: 90 tablet, Rfl: 1   spironolactone (ALDACTONE) 25 MG tablet, Take by mouth., Disp: , Rfl:    tiZANidine (ZANAFLEX) 4 MG tablet, Take 1 tablet (4 mg total) by mouth every 6 (six) hours as needed for muscle spasms., Disp: 90 tablet, Rfl: 0   traMADol (ULTRAM) 50 MG tablet, , Disp: , Rfl:    traZODone (DESYREL) 100 MG tablet, TAKE 1 TABLET AT BEDTIME, Disp:  90 tablet, Rfl: 3   vitamin E 45 MG (100 UNITS) capsule, Take by mouth daily., Disp: , Rfl:  No current facility-administered medications for this visit.  Facility-Administered Medications Ordered in Other Visits:    sodium chloride flush (NS) 0.9 % injection 10 mL, 10 mL, Intravenous, PRN, Earlie Server, MD, 10 mL at 04/02/21 [id number]  Past Medical History: Past Medical History:  Diagnosis Date   Anemia    Anxiety    Aortic stenosis    a. 05/2020 Echo: EF 45-50%, sev AS - seen by TAVR team @ [facility] - CTA sugg of tricuspid valve w/ fusing of 2 leaflets-TAVR deferred in setting of acute infxn; b. 07/2020 Echo: EF 50-55%, mod-sev AS; c. 12/2020 Echo: EF>55%. Mod-sev paradoxical low-flow low-gradient AS; d. 08/2021 Echo: EF 35-40%, severe AS, triv AI, mild MR.   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 Variable EF over time; b. 08/2017 Echo: EF 40%; b. 03/2020 Echo: EF 55-60%; c. 05/2020 Echo: EF 45-50%; d. 07/2020 Echo: EF 50-55%; e. 12/2020 Echo: EF>55%; f. 08/2021 Echo: EF 35-40%.   Chronic heart failure with preserved ejection fraction (HFpEF) ([place])    a. Variable EF over time; b. 08/2017 Echo: EF 40%; b. 03/2020 Echo: EF 55-60%; c. 05/2020 Echo: EF 45-50%; d. 07/2020 Echo: EF 50-55%; e. 12/2020 Echo: EF>55%; f. 08/2021 Echo: EF 35-40%, no RV, mildly dil LA, mild MR, triv AI, severe AS.   CKD (chronic kidney disease) stage 3, GFR 30-59 ml/min (HCC)    Depression    Diabetes mellitus (HCC)    Dizziness    Fatty liver    Frequent falls    GERD (gastroesophageal reflux disease)    Gout    Heart murmur    History of blood transfusion    History of bone marrow transplant ([place])    History of uterine fibroid    Hx of cardiac catheterization    a. 01/2016 Cath [facility] - after abnl nuc): Nl cors.   Hypertension    Hypomagnesemia    IDA (iron deficiency anemia)    Multiple myeloma (HCC)     Personal history of chemotherapy    PSVT (paroxysmal supraventricular tachycardia)    a. S/p [facility]).   PVC's (premature ventricular contractions)    a. Well-managed w/ bisoprolol in outpt setting.   Renal cyst     Tobacco Use: Social History   Tobacco Use  Smoking Status Former   Packs/day: 1.00   Years: 20.00   Additional pack years: 0.00   Total pack years: 20.00   Types: Cigarettes   Quit date: 07/02/1991   Years since quitting: 31.2  Smokeless Tobacco Never    Labs: Review Flowsheet  More data exists      Latest Ref Rng & Units 08/21/2021 08/24/2021 12/07/2021 02/05/2022 06/12/2022  Labs for ITP Cardiac and Pulmonary Rehab  Cholestrol 0 - 200 mg/dL 118  99  171  175  -  LDL (calc) 0 - 99 mg/dL 58  44  98  91  -  HDL-C >40 mg/dL 48  40  59  67  -  Trlycerides <150 mg/dL 61  77  76  87  -  Hemoglobin A1c 4.8 - 5.6 % 7.5  - 6.9  - 6.6      Exercise Target Goals: Exercise Program Goal: Individual exercise prescription set using results from initial 6 min walk test and THRR while considering  patient's activity barriers and safety.   Exercise Prescription Goal: Initial exercise prescription builds to 30-45 minutes a day of aerobic activity, 2-3 days per week.  Home exercise guidelines will be given to patient during program as part of exercise prescription that the participant will acknowledge.   Education: Aerobic Exercise: - Group verbal and visual presentation on the components of exercise prescription. Introduces F.I.T.T principle from ACSM for exercise prescriptions.  Reviews F.I.T.T. principles of aerobic exercise including progression. Written material given at graduation. Flowsheet Row Cardiac Rehab from 08/29/2022 in [facility] Cardiac and Pulmonary Rehab  Education need identified 08/29/22       Education: Resistance Exercise: - Group verbal and visual presentation on the components of exercise prescription. Introduces F.I.T.T principle from ACSM for  exercise prescriptions  Reviews F.I.T.T. principles of resistance exercise including progression. Written material given at graduation.    Education: Exercise & Equipment Safety: - Individual verbal instruction and demonstration of equipment use and safety with use of the equipment. Flowsheet Row Cardiac Rehab from 08/29/2022 in [facility] Cardiac and Pulmonary Rehab  Date 08/29/22  Educator NT  Instruction Review Code 1- Verbalizes Understanding       Education: Exercise Physiology & General Exercise Guidelines: - Group verbal and written instruction with models to review the exercise physiology of the cardiovascular system and associated critical values. Provides general exercise guidelines with specific guidelines to those with heart or lung disease.    Education: Flexibility, Balance, Mind/Body Relaxation: - Group verbal and visual presentation with interactive activity on the components of exercise prescription. Introduces F.I.T.T principle from ACSM for exercise prescriptions. Reviews F.I.T.T. principles of flexibility and balance exercise training including progression. Also discusses the mind body connection.  Reviews various relaxation techniques to help reduce and manage stress (i.e. Deep breathing, progressive muscle relaxation, and visualization). Balance handout provided to take home. Written material given at graduation.   Activity Barriers & Risk Stratification:  Activity Barriers & Cardiac Risk Stratification - 08/29/22 1347       Activity Barriers & Cardiac Risk Stratification   Activity Barriers Back Problems;Balance Concerns   lower back pain, sciatic pain, carpal tunnel   Cardiac Risk Stratification High             6 Minute Walk:  6 Minute Walk     Row Name 08/29/22 1343         6 Minute Walk   Phase Initial     Distance 1095 feet     Walk Time 6 minutes     # of Rest Breaks 0     MPH 2.07     METS 2.67     RPE 9     Perceived Dyspnea  1  VO2 Peak  9.34     Symptoms Yes (comment)     Comments R leg numbness, Low Back pain 7/10     Resting HR 67 bpm     Resting BP 112/62     Resting Oxygen Saturation  99 %     Exercise Oxygen Saturation  during 6 min walk 98 %     Max Ex. HR 89 bpm     Max Ex. BP 120/64     2 Minute Post BP 110/62              Oxygen Initial Assessment:   Oxygen Re-Evaluation:   Oxygen Discharge (Final Oxygen Re-Evaluation):   Initial Exercise Prescription:  Initial Exercise Prescription - 08/29/22 1400       Date of Initial Exercise RX and Referring Provider   Date 08/29/22    Referring Provider Kandis Cocking, MD      Oxygen   Maintain Oxygen Saturation 88% or higher      Treadmill   MPH 1.8    Grade 0    Minutes 15    METs 2.38      NuStep   Level 2    SPM 80    Minutes 15    METs 2.67      REL-XR   Level 1    Speed 50    Minutes 15    METs 2.67      Track   Laps 30    Minutes 15    METs 2.63      Prescription Details   Frequency (times per week) 2    Duration Progress to 30 minutes of continuous aerobic without signs/symptoms of physical distress      Intensity   THRR 40-80% of Max Heartrate 102-137    Ratings of Perceived Exertion 11-13    Perceived Dyspnea 0-4      Progression   Progression Continue to progress workloads to maintain intensity without signs/symptoms of physical distress.      Resistance Training   Training Prescription Yes    Weight 2 lb    Reps 10-15             Perform Capillary Blood Glucose checks as needed.  Exercise Prescription Changes:   Exercise Prescription Changes     Row Name 08/29/22 1400             Response to Exercise   Blood Pressure (Admit) 112/62       Blood Pressure (Exercise) 120/64       Blood Pressure (Exit) 110/62       Heart Rate (Admit) 67 bpm       Heart Rate (Exercise) 89 bpm       Heart Rate (Exit) 68 bpm       Oxygen Saturation (Admit) 99 %       Oxygen Saturation (Exercise) 98 %       Rating of  Perceived Exertion (Exercise) 9       Perceived Dyspnea (Exercise) 1       Symptoms R leg numbness, Low back pain 7/10       Comments 6MWT Results                Exercise Comments:   Exercise Goals and Review:   Exercise Goals     Row Name 08/29/22 1349             Exercise Goals   Increase Physical Activity Yes  Intervention Provide advice, education, support and counseling about physical activity/exercise needs.;Develop an individualized exercise prescription for aerobic and resistive training based on initial evaluation findings, risk stratification, comorbidities and participant's personal goals.       Expected Outcomes Short Term: Attend rehab on a regular basis to increase amount of physical activity.;Long Term: Add in home exercise to make exercise part of routine and to increase amount of physical activity.;Long Term: Exercising regularly at least 3-5 days a week.       Increase Strength and Stamina Yes       Intervention Provide advice, education, support and counseling about physical activity/exercise needs.;Develop an individualized exercise prescription for aerobic and resistive training based on initial evaluation findings, risk stratification, comorbidities and participant's personal goals.       Expected Outcomes Short Term: Perform resistance training exercises routinely during rehab and add in resistance training at home;Short Term: Increase workloads from initial exercise prescription for resistance, speed, and METs.;Long Term: Improve cardiorespiratory fitness, muscular endurance and strength as measured by increased METs and functional capacity (6MWT)       Able to understand and use rate of perceived exertion (RPE) scale Yes       Intervention Provide education and explanation on how to use RPE scale       Expected Outcomes Short Term: Able to use RPE daily in rehab to express subjective intensity level;Long Term:  Able to use RPE to guide intensity level  when exercising independently       Able to understand and use Dyspnea scale Yes       Intervention Provide education and explanation on how to use Dyspnea scale       Expected Outcomes Short Term: Able to use Dyspnea scale daily in rehab to express subjective sense of shortness of breath during exertion;Long Term: Able to use Dyspnea scale to guide intensity level when exercising independently       Knowledge and understanding of Target Heart Rate Range (THRR) Yes       Intervention Provide education and explanation of THRR including how the numbers were predicted and where they are located for reference       Expected Outcomes Long Term: Able to use THRR to govern intensity when exercising independently;Short Term: Able to state/look up THRR;Short Term: Able to use daily as guideline for intensity in rehab       Able to check pulse independently Yes       Intervention Review the importance of being able to check your own pulse for safety during independent exercise;Provide education and demonstration on how to check pulse in carotid and radial arteries.       Expected Outcomes Short Term: Able to explain why pulse checking is important during independent exercise;Long Term: Able to check pulse independently and accurately       Understanding of Exercise Prescription Yes       Intervention Provide education, explanation, and written materials on patient's individual exercise prescription       Expected Outcomes Short Term: Able to explain program exercise prescription;Long Term: Able to explain home exercise prescription to exercise independently                Exercise Goals Re-Evaluation :   Discharge Exercise Prescription (Final Exercise Prescription Changes):  Exercise Prescription Changes - 08/29/22 1400       Response to Exercise   Blood Pressure (Admit) 112/62    Blood Pressure (Exercise) 120/64    Blood Pressure (Exit)  110/62    Heart Rate (Admit) 67 bpm    Heart Rate  (Exercise) 89 bpm    Heart Rate (Exit) 68 bpm    Oxygen Saturation (Admit) 99 %    Oxygen Saturation (Exercise) 98 %    Rating of Perceived Exertion (Exercise) 9    Perceived Dyspnea (Exercise) 1    Symptoms R leg numbness, Low back pain 7/10    Comments 6MWT Results             Nutrition:  Target Goals: Understanding of nutrition guidelines, daily intake of sodium 1500mg , cholesterol 200mg , calories 30% from fat and 7% or less from saturated fats, daily to have 5 or more servings of fruits and vegetables.  Education: All About Nutrition: -Group instruction provided by verbal, written material, interactive activities, discussions, models, and posters to present general guidelines for heart healthy nutrition including fat, fiber, MyPlate, the role of sodium in heart healthy nutrition, utilization of the nutrition label, and utilization of this knowledge for meal planning. Follow up email sent as well. Written material given at graduation. Flowsheet Row Cardiac Rehab from 08/29/2022 in [facility] Cardiac and Pulmonary Rehab  Education need identified 08/29/22       Biometrics:  Pre Biometrics - 08/29/22 1350       Pre Biometrics   Height 5' 4.5" (1.638 m)    Weight 153 lb 11.2 oz (69.7 kg)    Waist Circumference 33 inches    Hip Circumference 40 inches    Waist to Hip Ratio 0.83 %    BMI (Calculated) 25.98    Single Leg Stand 3.7 seconds              Nutrition Therapy Plan and Nutrition Goals:  Nutrition Therapy & Goals - 08/29/22 1339       Intervention Plan   Intervention Prescribe, educate and counsel regarding individualized specific dietary modifications aiming towards targeted core components such as weight, hypertension, lipid management, diabetes, heart failure and other comorbidities.    Expected Outcomes Long Term Goal: Adherence to prescribed nutrition plan.;Short Term Goal: Understand basic principles of dietary content, such as calories, fat, sodium,  cholesterol and nutrients.;Short Term Goal: A plan has been developed with personal nutrition goals set during dietitian appointment.             Nutrition Assessments:  MEDIFICTS Score Key: ?70 Need to make dietary changes  40-70 Heart Healthy Diet ? 40 Therapeutic Level Cholesterol Diet  Flowsheet Row Cardiac Rehab from 08/29/2022 in [facility] Cardiac and Pulmonary Rehab  Picture Your Plate Total Score on Admission 48      Picture Your Plate Scores: [id number] Unhealthy dietary pattern with much room for improvement. 41-50 Dietary pattern unlikely to meet recommendations for good health and room for improvement. 51-60 More healthful dietary pattern, with some room for improvement.  >60 Healthy dietary pattern, although there may be some specific behaviors that could be improved.    Nutrition Goals Re-Evaluation:   Nutrition Goals Discharge (Final Nutrition Goals Re-Evaluation):   Psychosocial: Target Goals: Acknowledge presence or absence of significant depression and/or stress, maximize coping skills, provide positive support system. Participant is able to verbalize types and ability to use techniques and skills needed for reducing stress and depression.   Education: Stress, Anxiety, and Depression - Group verbal and visual presentation to define topics covered.  Reviews how body is impacted by stress, anxiety, and depression.  Also discusses healthy ways to reduce stress and to treat/manage anxiety and depression.  Written material given at graduation.   Education: Sleep Hygiene -Provides group verbal and written instruction about how sleep can affect your health.  Define sleep hygiene, discuss sleep cycles and impact of sleep habits. Review good sleep hygiene tips.    Initial Review & Psychosocial Screening:  Initial Psych Review & Screening - 08/27/22 1310       Initial Review   Current issues with Current Stress Concerns    Source of Stress Concerns Chronic Illness;Family       Family Dynamics   Good Support System? Yes   sister     Barriers   Psychosocial barriers to participate in program There are no identifiable barriers or psychosocial needs.;The patient should benefit from training in stress management and relaxation.      Screening Interventions   Interventions Encouraged to exercise;Provide feedback about the scores to participant;To provide support and resources with identified psychosocial needs    Expected Outcomes Short Term goal: Utilizing psychosocial counselor, staff and physician to assist with identification of specific Stressors or current issues interfering with healing process. Setting desired goal for each stressor or current issue identified.;Long Term Goal: Stressors or current issues are controlled or eliminated.;Short Term goal: Identification and review with participant of any Quality of Life or Depression concerns found by scoring the questionnaire.;Long Term goal: The participant improves quality of Life and PHQ9 Scores as seen by post scores and/or verbalization of changes             Quality of Life Scores:   Quality of Life - 08/29/22 1335       Quality of Life   Select Quality of Life      Quality of Life Scores   Health/Function Pre 22.1 %    Socioeconomic Pre 21.19 %    Psych/Spiritual Pre 24.86 %    Family Pre 20.4 %    GLOBAL Pre 22.2 %            Scores of 19 and below usually indicate a poorer quality of life in these areas.  A difference of  2-3 points is a clinically meaningful difference.  A difference of 2-3 points in the total score of the Quality of Life Index has been associated with significant improvement in overall quality of life, self-image, physical symptoms, and general health in studies assessing change in quality of life.  PHQ-9: Review Flowsheet  More data exists      08/29/2022 03/01/2022 12/07/2021 06/22/2021 05/28/2021  Depression screen PHQ 2/9  Decreased Interest 2 2 2 1 1   Down,  Depressed, Hopeless 1 1 2 1 1   PHQ - 2 Score 3 3 4 2 2   Altered sleeping 1 3 2 1 3   Tired, decreased energy 3 3 0 1 2  Change in appetite 1 0 0 1 3  Feeling bad or failure about yourself  0 0 2 0 0  Trouble concentrating 0 1 0 1 1  Moving slowly or fidgety/restless 0 0 0 0 1  Suicidal thoughts 0 0 0 0 0  PHQ-9 Score 8 10 8 6 12   Difficult doing work/chores Somewhat difficult Somewhat difficult - Not difficult at all Not difficult at all   Interpretation of Total Score  Total Score Depression Severity:  1-4 = Minimal depression, 5-9 = Mild depression, 10-14 = Moderate depression, 15-19 = Moderately severe depression, 20-27 = Severe depression   Psychosocial Evaluation and Intervention:  Psychosocial Evaluation - 08/27/22 1317       Psychosocial Evaluation &  Interventions   Interventions Encouraged to exercise with the program and follow exercise prescription    Comments Savayah is coming to cardiac rehab with the diagnosis of heart failure. She also is currently receiving treatment for multiple myeloma. She does have a copay and does not think she will be able to cover the whole program, but is interested in the CARE program. She mentions she is always under stress when it comes to her health and family, but that she tries to stay positive and takes it one day at a time. Her sister is a huge support and she also loves her 5 cats and 5 dogs.    Expected Outcomes Short: attend cardiac rehab for education and exercise. Long: develop and maintain positive self care habits.    Continue Psychosocial Services  Follow up required by staff             Psychosocial Re-Evaluation:   Psychosocial Discharge (Final Psychosocial Re-Evaluation):   Vocational Rehabilitation: Provide vocational rehab assistance to qualifying candidates.   Vocational Rehab Evaluation & Intervention:  Vocational Rehab - 08/27/22 1310       Initial Vocational Rehab Evaluation & Intervention   Assessment shows  need for Vocational Rehabilitation No             Education: Education Goals: Education classes will be provided on a variety of topics geared toward better understanding of heart health and risk factor modification. Participant will state understanding/return demonstration of topics presented as noted by education test scores.  Learning Barriers/Preferences:  Learning Barriers/Preferences - 08/27/22 1310       Learning Barriers/Preferences   Learning Barriers None    Learning Preferences None             General Cardiac Education Topics:  AED/CPR: - Group verbal and written instruction with the use of models to demonstrate the basic use of the AED with the basic ABC's of resuscitation.   Anatomy and Cardiac Procedures: - Group verbal and visual presentation and models provide information about basic cardiac anatomy and function. Reviews the testing methods done to diagnose heart disease and the outcomes of the test results. Describes the treatment choices: Medical Management, Angioplasty, or Coronary Bypass Surgery for treating various heart conditions including Myocardial Infarction, Angina, Valve Disease, and Cardiac Arrhythmias.  Written material given at graduation. Flowsheet Row Cardiac Rehab from 08/29/2022 in [facility] Cardiac and Pulmonary Rehab  Education need identified 08/29/22       Medication Safety: - Group verbal and visual instruction to review commonly prescribed medications for heart and lung disease. Reviews the medication, class of the drug, and side effects. Includes the steps to properly store meds and maintain the prescription regimen.  Written material given at graduation.   Intimacy: - Group verbal instruction through game format to discuss how heart and lung disease can affect sexual intimacy. Written material given at graduation..   Know Your Numbers and Heart Failure: - Group verbal and visual instruction to discuss disease risk factors for  cardiac and pulmonary disease and treatment options.  Reviews associated critical values for Overweight/Obesity, Hypertension, Cholesterol, and Diabetes.  Discusses basics of heart failure: signs/symptoms and treatments.  Introduces Heart Failure Zone chart for action plan for heart failure.  Written material given at graduation.   Infection Prevention: - Provides verbal and written material to individual with discussion of infection control including proper hand washing and proper equipment cleaning during exercise session. Flowsheet Row Cardiac Rehab from 08/29/2022 in [facility] Cardiac and Pulmonary  Rehab  Date 08/29/22  Educator NT  Instruction Review Code 1- Verbalizes Understanding       Falls Prevention: - Provides verbal and written material to individual with discussion of falls prevention and safety. Flowsheet Row Cardiac Rehab from 08/29/2022 in [facility] Cardiac and Pulmonary Rehab  Date 08/29/22  Educator NT  Instruction Review Code 1- Verbalizes Understanding       Other: -Provides group and verbal instruction on various topics (see comments)   Knowledge Questionnaire Score:  Knowledge Questionnaire Score - 08/29/22 1334       Knowledge Questionnaire Score   Pre Score 23/26             Core Components/Risk Factors/Patient Goals at Admission:  Personal Goals and Risk Factors at Admission - 08/27/22 1311       Core Components/Risk Factors/Patient Goals on Admission   Diabetes Yes    Intervention Provide education about signs/symptoms and action to take for hypo/hyperglycemia.;Provide education about proper nutrition, including hydration, and aerobic/resistive exercise prescription along with prescribed medications to achieve blood glucose in normal ranges: Fasting glucose 65-99 mg/dL    Expected Outcomes Short Term: Participant verbalizes understanding of the signs/symptoms and immediate care of hyper/hypoglycemia, proper foot care and importance of medication,  aerobic/resistive exercise and nutrition plan for blood glucose control.;Long Term: Attainment of HbA1C < 7%.    Heart Failure Yes    Intervention Provide a combined exercise and nutrition program that is supplemented with education, support and counseling about heart failure. Directed toward relieving symptoms such as shortness of breath, decreased exercise tolerance, and extremity edema.    Expected Outcomes Improve functional capacity of life;Short term: Attendance in program 2-3 days a week with increased exercise capacity. Reported lower sodium intake. Reported increased fruit and vegetable intake. Reports medication compliance.;Short term: Daily weights obtained and reported for increase. Utilizing diuretic protocols set by physician.;Long term: Adoption of self-care skills and reduction of barriers for early signs and symptoms recognition and intervention leading to self-care maintenance.    Hypertension Yes    Intervention Provide education on lifestyle modifcations including regular physical activity/exercise, weight management, moderate sodium restriction and increased consumption of fresh fruit, vegetables, and low fat dairy, alcohol moderation, and smoking cessation.;Monitor prescription use compliance.    Expected Outcomes Short Term: Continued assessment and intervention until BP is < 140/12mm HG in hypertensive participants. < 130/52mm HG in hypertensive participants with diabetes, heart failure or chronic kidney disease.;Long Term: Maintenance of blood pressure at goal levels.    Lipids Yes    Intervention Provide education and support for participant on nutrition & aerobic/resistive exercise along with prescribed medications to achieve LDL 70mg , HDL >40mg .    Expected Outcomes Short Term: Participant states understanding of desired cholesterol values and is compliant with medications prescribed. Participant is following exercise prescription and nutrition guidelines.;Long Term: Cholesterol  controlled with medications as prescribed, with individualized exercise RX and with personalized nutrition plan. Value goals: LDL < 70mg , HDL > 40 mg.             Education:Diabetes - Individual verbal and written instruction to review signs/symptoms of diabetes, desired ranges of glucose level fasting, after meals and with exercise. Acknowledge that pre and post exercise glucose checks will be done for 3 sessions at entry of program. [place] from 08/29/2022 in [facility] Cardiac and Pulmonary Rehab  Date 08/27/22  Educator [facility]  Instruction Review Code 1- Verbalizes Understanding       Core Components/Risk Factors/Patient Goals Review:  Core Components/Risk Factors/Patient Goals at Discharge (Final Review):    ITP Comments:  ITP Comments     Row Name 08/27/22 1329 08/29/22 1244 09/18/22 0942       ITP Comments Initial phone call completed. Diagnosis can be found in [facility] 2/22. EP Orientation scheduled for Thursday 2/29 at 9:30. Completed 6MWT and gym orientation. Initial ITP created and sent for review to Dr. Emily Filbert, Medical Director. 30 Day review completed. Medical Director ITP review done, changes made as directed, and signed approval by Medical Director.    new to prgram              Comments:

## 2022-09-19 ENCOUNTER — Encounter: Payer: Medicare PPO | Attending: Family Medicine | Admitting: *Deleted

## 2022-09-19 DIAGNOSIS — I5042 Chronic combined systolic (congestive) and diastolic (congestive) heart failure: Secondary | ICD-10-CM

## 2022-09-19 DIAGNOSIS — Z5189 Encounter for other specified aftercare: Secondary | ICD-10-CM | POA: Diagnosis not present

## 2022-09-19 LAB — GLUCOSE, CAPILLARY
Glucose-Capillary: 142 mg/dL — ABNORMAL HIGH (ref 70–99)
Glucose-Capillary: 149 mg/dL — ABNORMAL HIGH (ref 70–99)

## 2022-09-19 NOTE — Progress Notes (Signed)
Daily Session Note  Patient Details  Name: Tiearra Labianca MRN: [id number] Date of Birth: 1955-10-24 Referring Provider:   Flowsheet Row Cardiac Rehab from 08/29/2022 in [facility] Cardiac and Pulmonary Rehab  Referring Provider Kandis Cocking, MD       Encounter Date: 09/19/2022  Check In:  Session Check In - 09/19/22 0803       Check-In   Supervising physician immediately available to respond to emergencies See telemetry face sheet for immediately available ER MD    Location [facility]-Cardiac & Pulmonary Rehab    Staff Present Darlyne Russian, RN, ADN;Krista Spencer RN, Abel Presto, MS, ACSM CEP, Exercise Physiologist;Joseph Tessie Fass, [place]    Virtual Visit No    Medication changes reported     No    Fall or balance concerns reported    No    Warm-up and Cool-down Performed on first and last piece of equipment    Resistance Training Performed Yes    VAD Patient? No    PAD/SET Patient? No      Pain Assessment   Currently in Pain? No/denies                Social History   Tobacco Use  Smoking Status Former   Packs/day: 1.00   Years: 20.00   Additional pack years: 0.00   Total pack years: 20.00   Types: Cigarettes   Quit date: 07/02/1991   Years since quitting: 31.2  Smokeless Tobacco Never    Goals Met:  Independence with exercise equipment Exercise tolerated well No report of concerns or symptoms today Strength training completed today  Goals Unmet:  Not Applicable  Comments: First full day of exercise!  Patient was oriented to gym and equipment including functions, settings, policies, and procedures.  Patient's individual exercise prescription and treatment plan were reviewed.  All starting workloads were established based on the results of the 6 minute walk test done at initial orientation visit.  The plan for exercise progression was also introduced and progression will be customized based on patient's performance and goals.    Dr. Emily Filbert is Medical  Director for [place].  Dr. Ottie Glazier is Medical Director for [facility] Pulmonary Rehabilitation.

## 2022-09-23 ENCOUNTER — Inpatient Hospital Stay: Payer: Medicare PPO

## 2022-09-23 VITALS — BP 122/69 | HR 58 | Temp 98.2°F | Resp 20

## 2022-09-23 DIAGNOSIS — I350 Nonrheumatic aortic (valve) stenosis: Secondary | ICD-10-CM | POA: Diagnosis not present

## 2022-09-23 DIAGNOSIS — D801 Nonfamilial hypogammaglobulinemia: Secondary | ICD-10-CM | POA: Diagnosis not present

## 2022-09-23 DIAGNOSIS — C9000 Multiple myeloma not having achieved remission: Secondary | ICD-10-CM

## 2022-09-23 DIAGNOSIS — C9001 Multiple myeloma in remission: Secondary | ICD-10-CM

## 2022-09-23 DIAGNOSIS — E538 Deficiency of other specified B group vitamins: Secondary | ICD-10-CM | POA: Diagnosis not present

## 2022-09-23 DIAGNOSIS — G620 Drug-induced polyneuropathy: Secondary | ICD-10-CM | POA: Diagnosis not present

## 2022-09-23 DIAGNOSIS — D509 Iron deficiency anemia, unspecified: Secondary | ICD-10-CM | POA: Diagnosis not present

## 2022-09-23 DIAGNOSIS — T451X5A Adverse effect of antineoplastic and immunosuppressive drugs, initial encounter: Secondary | ICD-10-CM | POA: Diagnosis not present

## 2022-09-23 DIAGNOSIS — R197 Diarrhea, unspecified: Secondary | ICD-10-CM | POA: Diagnosis not present

## 2022-09-23 LAB — MAGNESIUM: Magnesium: 1.4 mg/dL — ABNORMAL LOW (ref 1.7–2.4)

## 2022-09-23 MED ORDER — MAGNESIUM SULFATE 4 GM/100ML IV SOLN
4.0000 g | Freq: Once | INTRAVENOUS | Status: AC
  Administered 2022-09-23: 4 g via INTRAVENOUS
  Filled 2022-09-23: qty 100

## 2022-09-23 MED ORDER — SODIUM CHLORIDE 0.9 % IV SOLN
25.0000 mg | Freq: Once | INTRAVENOUS | Status: AC
  Administered 2022-09-23: 25 mg via INTRAVENOUS
  Filled 2022-09-23: qty 1

## 2022-09-23 MED ORDER — HEPARIN SOD (PORK) LOCK FLUSH 100 UNIT/ML IV SOLN
500.0000 [IU] | Freq: Once | INTRAVENOUS | Status: AC | PRN
  Administered 2022-09-23: 500 [IU]
  Filled 2022-09-23: qty 5

## 2022-09-23 MED ORDER — SODIUM CHLORIDE 0.9 % IV SOLN
Freq: Once | INTRAVENOUS | Status: AC
  Filled 2022-09-23: qty 250

## 2022-09-23 MED ORDER — SODIUM CHLORIDE 0.9% FLUSH
10.0000 mL | Freq: Once | INTRAVENOUS | Status: AC | PRN
  Administered 2022-09-23: 10 mL
  Filled 2022-09-23: qty 10

## 2022-09-25 ENCOUNTER — Ambulatory Visit (INDEPENDENT_AMBULATORY_CARE_PROVIDER_SITE_OTHER): Payer: Medicare PPO | Admitting: Family Medicine

## 2022-09-25 ENCOUNTER — Encounter: Payer: Self-pay | Admitting: Oncology

## 2022-09-25 ENCOUNTER — Encounter: Payer: Self-pay | Admitting: Internal Medicine

## 2022-09-25 ENCOUNTER — Encounter: Payer: Self-pay | Admitting: Family Medicine

## 2022-09-25 VITALS — BP 122/80 | HR 55 | Temp 98.7°F | Ht 64.5 in | Wt 157.0 lb

## 2022-09-25 DIAGNOSIS — J0110 Acute frontal sinusitis, unspecified: Secondary | ICD-10-CM

## 2022-09-25 MED ORDER — PROMETHAZINE-DM 6.25-15 MG/5ML PO SYRP: 5.0000 mL | ORAL_SOLUTION | Freq: Four times a day (QID) | ORAL | 0 refills | Status: DC | PRN

## 2022-09-25 MED ORDER — AZITHROMYCIN 250 MG PO TABS: ORAL_TABLET | ORAL | 0 refills | Status: AC

## 2022-09-26 ENCOUNTER — Encounter: Payer: Medicare PPO | Admitting: *Deleted

## 2022-09-26 DIAGNOSIS — I5042 Chronic combined systolic (congestive) and diastolic (congestive) heart failure: Secondary | ICD-10-CM

## 2022-09-26 DIAGNOSIS — Z5189 Encounter for other specified aftercare: Secondary | ICD-10-CM | POA: Diagnosis not present

## 2022-09-26 LAB — GLUCOSE, CAPILLARY
Glucose-Capillary: 203 mg/dL — ABNORMAL HIGH (ref 70–99)
Glucose-Capillary: 143 mg/dL — ABNORMAL HIGH (ref 70–99)

## 2022-09-26 NOTE — Progress Notes (Signed)
Daily Session Note  Patient Details  Name: Sarah [name] MRN: [id number] Date of Birth: 1956-04-19 Referring Provider:   Flowsheet Row Cardiac Rehab from 08/29/2022 in [facility] Cardiac and Pulmonary Rehab  Referring Provider Kandis Cocking, MD       Encounter Date: 09/26/2022  Check In:  Session Check In - 09/26/22 0759       Check-In   Supervising physician immediately available to respond to emergencies See telemetry face sheet for immediately available ER MD    Location [facility]-Cardiac & Pulmonary Rehab    Staff Present Darlyne Russian, RN, ADN;Jessica Luan Pulling, MA, RCEP, CCRP, CCET;Joseph [place], [place]    Virtual Visit No    Medication changes reported     Yes    Comments completed z-pack; started effexor    Fall or balance concerns reported    No    Warm-up and Cool-down Performed on first and last piece of equipment    Resistance Training Performed Yes    VAD Patient? No    PAD/SET Patient? No      Pain Assessment   Currently in Pain? No/denies                Social History   Tobacco Use  Smoking Status Former   Packs/day: 1.00   Years: 20.00   Additional pack years: 0.00   Total pack years: 20.00   Types: Cigarettes   Quit date: 07/02/1991   Years since quitting: 31.2  Smokeless Tobacco Never    Goals Met:  Independence with exercise equipment Exercise tolerated well No report of concerns or symptoms today Strength training completed today  Goals Unmet:  Not Applicable  Comments: Pt able to follow exercise prescription today without complaint.  Will continue to monitor for progression.    Dr. Emily Filbert is Medical Director for [place].  Dr. Ottie Glazier is Medical Director for [facility] Pulmonary Rehabilitation.

## 2022-09-30 ENCOUNTER — Inpatient Hospital Stay: Payer: Medicare PPO

## 2022-09-30 ENCOUNTER — Inpatient Hospital Stay: Payer: Medicare PPO | Attending: Oncology

## 2022-09-30 VITALS — BP 106/61 | HR 65 | Temp 98.1°F | Resp 20

## 2022-09-30 DIAGNOSIS — C9000 Multiple myeloma not having achieved remission: Secondary | ICD-10-CM

## 2022-09-30 DIAGNOSIS — I2510 Atherosclerotic heart disease of native coronary artery without angina pectoris: Secondary | ICD-10-CM | POA: Insufficient documentation

## 2022-09-30 DIAGNOSIS — C9001 Multiple myeloma in remission: Secondary | ICD-10-CM | POA: Diagnosis not present

## 2022-09-30 DIAGNOSIS — I129 Hypertensive chronic kidney disease with stage 1 through stage 4 chronic kidney disease, or unspecified chronic kidney disease: Secondary | ICD-10-CM | POA: Insufficient documentation

## 2022-09-30 DIAGNOSIS — Z79899 Other long term (current) drug therapy: Secondary | ICD-10-CM | POA: Insufficient documentation

## 2022-09-30 DIAGNOSIS — Z9221 Personal history of antineoplastic chemotherapy: Secondary | ICD-10-CM | POA: Insufficient documentation

## 2022-09-30 DIAGNOSIS — N1832 Chronic kidney disease, stage 3b: Secondary | ICD-10-CM | POA: Insufficient documentation

## 2022-09-30 DIAGNOSIS — Z95828 Presence of other vascular implants and grafts: Secondary | ICD-10-CM

## 2022-09-30 LAB — CBC WITH DIFFERENTIAL (CANCER CENTER ONLY)
Abs Immature Granulocytes: 0.02 10*3/uL (ref 0.00–0.07)
Basophils Absolute: 0 10*3/uL (ref 0.0–0.1)
Basophils Relative: 1 %
Eosinophils Absolute: 0.2 10*3/uL (ref 0.0–0.5)
Eosinophils Relative: 3 %
HCT: 35.4 % — ABNORMAL LOW (ref 36.0–46.0)
Hemoglobin: 11.7 g/dL — ABNORMAL LOW (ref 12.0–15.0)
Immature Granulocytes: 0 %
Lymphocytes Relative: 23 %
Lymphs Abs: 1.4 10*3/uL (ref 0.7–4.0)
MCH: 28.5 pg (ref 26.0–34.0)
MCHC: 33.1 g/dL (ref 30.0–36.0)
MCV: 86.3 fL (ref 80.0–100.0)
Monocytes Absolute: 0.5 10*3/uL (ref 0.1–1.0)
Monocytes Relative: 8 %
Neutro Abs: 3.9 10*3/uL (ref 1.7–7.7)
Neutrophils Relative %: 65 %
Platelet Count: 120 10*3/uL — ABNORMAL LOW (ref 150–400)
RBC: 4.1 MIL/uL (ref 3.87–5.11)
RDW: 13.7 % (ref 11.5–15.5)
WBC Count: 6.1 10*3/uL (ref 4.0–10.5)
nRBC: 0 % (ref 0.0–0.2)

## 2022-09-30 LAB — CMP (CANCER CENTER ONLY)
ALT: 26 U/L (ref 0–44)
AST: 28 U/L (ref 15–41)
Albumin: 4 g/dL (ref 3.5–5.0)
Alkaline Phosphatase: 88 U/L (ref 38–126)
Anion gap: 7 (ref 5–15)
BUN: 22 mg/dL (ref 8–23)
CO2: 23 mmol/L (ref 22–32)
Calcium: 8.2 mg/dL — ABNORMAL LOW (ref 8.9–10.3)
Chloride: 106 mmol/L (ref 98–111)
Creatinine: 1.28 mg/dL — ABNORMAL HIGH (ref 0.44–1.00)
GFR, Estimated: 46 mL/min — ABNORMAL LOW (ref >60)
Glucose, Bld: 107 mg/dL — ABNORMAL HIGH (ref 70–99)
Potassium: 4 mmol/L (ref 3.5–5.1)
Sodium: 136 mmol/L (ref 135–145)
Total Bilirubin: 0.3 mg/dL (ref 0.3–1.2)
Total Protein: 7.1 g/dL (ref 6.5–8.1)

## 2022-09-30 LAB — MAGNESIUM: Magnesium: 1.3 mg/dL — ABNORMAL LOW (ref 1.7–2.4)

## 2022-09-30 MED ORDER — SODIUM CHLORIDE 0.9% FLUSH
10.0000 mL | Freq: Once | INTRAVENOUS | Status: AC
  Administered 2022-09-30: 10 mL via INTRAVENOUS
  Filled 2022-09-30: qty 10

## 2022-09-30 MED ORDER — SODIUM CHLORIDE 0.9 % IV SOLN
Freq: Once | INTRAVENOUS | Status: AC
  Filled 2022-09-30: qty 250

## 2022-09-30 MED ORDER — MAGNESIUM SULFATE 2 GM/50ML IV SOLN
2.0000 g | Freq: Once | INTRAVENOUS | Status: AC
  Administered 2022-09-30: 2 g via INTRAVENOUS
  Filled 2022-09-30: qty 50

## 2022-09-30 MED ORDER — MAGNESIUM SULFATE 4 GM/100ML IV SOLN
4.0000 g | Freq: Once | INTRAVENOUS | Status: AC
  Administered 2022-09-30: 4 g via INTRAVENOUS
  Filled 2022-09-30: qty 100

## 2022-09-30 MED ORDER — HEPARIN SOD (PORK) LOCK FLUSH 100 UNIT/ML IV SOLN
500.0000 [IU] | Freq: Once | INTRAVENOUS | Status: AC
  Administered 2022-09-30: 500 [IU]
  Filled 2022-09-30: qty 5

## 2022-09-30 MED ORDER — SODIUM CHLORIDE 0.9 % IV SOLN: 4.0000 g | Freq: Once | INTRAVENOUS | Status: DC

## 2022-09-30 MED ORDER — SODIUM CHLORIDE 0.9 % IV SOLN: 6.0000 g | Freq: Once | INTRAVENOUS | Status: DC

## 2022-09-30 MED ORDER — SODIUM CHLORIDE 0.9 % IV SOLN
25.0000 mg | Freq: Once | INTRAVENOUS | Status: AC
  Administered 2022-09-30: 25 mg via INTRAVENOUS
  Filled 2022-09-30: qty 1

## 2022-10-01 ENCOUNTER — Encounter: Payer: Medicare PPO | Attending: Family Medicine | Admitting: *Deleted

## 2022-10-01 DIAGNOSIS — I10 Essential (primary) hypertension: Secondary | ICD-10-CM | POA: Diagnosis not present

## 2022-10-01 DIAGNOSIS — C9000 Multiple myeloma not having achieved remission: Secondary | ICD-10-CM | POA: Diagnosis not present

## 2022-10-01 DIAGNOSIS — I5042 Chronic combined systolic (congestive) and diastolic (congestive) heart failure: Secondary | ICD-10-CM | POA: Insufficient documentation

## 2022-10-01 DIAGNOSIS — N1831 Chronic kidney disease, stage 3a: Secondary | ICD-10-CM | POA: Diagnosis not present

## 2022-10-01 DIAGNOSIS — D631 Anemia in chronic kidney disease: Secondary | ICD-10-CM | POA: Diagnosis not present

## 2022-10-01 DIAGNOSIS — E1122 Type 2 diabetes mellitus with diabetic chronic kidney disease: Secondary | ICD-10-CM | POA: Diagnosis not present

## 2022-10-01 LAB — GLUCOSE, CAPILLARY
Glucose-Capillary: 173 mg/dL — ABNORMAL HIGH (ref 70–99)
Glucose-Capillary: 170 mg/dL — ABNORMAL HIGH (ref 70–99)

## 2022-10-01 NOTE — Progress Notes (Signed)
Daily Session Note  Patient Details  Name: Fey Dorsi MRN: [id number] Date of Birth: 1956-04-01 Referring Provider:   Flowsheet Row Cardiac Rehab from 08/29/2022 in [facility] Cardiac and Pulmonary Rehab  Referring Provider Kandis Cocking, MD       Encounter Date: 10/01/2022  Check In:  Session Check In - 10/01/22 0846       Check-In   Supervising physician immediately available to respond to emergencies See telemetry face sheet for immediately available ER MD    Location [facility]-Cardiac & Pulmonary Rehab    Staff Present Heath Lark, RN, BSN, Kathaleen Maser, MS, ACSM CEP, Exercise Physiologist;Other   Gretchen Short BS Exercise Science   Virtual Visit No    Medication changes reported     No    Fall or balance concerns reported    No    Warm-up and Cool-down Performed on first and last piece of equipment    Resistance Training Performed Yes    VAD Patient? No    PAD/SET Patient? No      Pain Assessment   Currently in Pain? No/denies                Social History   Tobacco Use  Smoking Status Former   Packs/day: 1.00   Years: 20.00   Additional pack years: 0.00   Total pack years: 20.00   Types: Cigarettes   Quit date: 07/02/1991   Years since quitting: 31.2  Smokeless Tobacco Never    Goals Met:  Independence with exercise equipment Exercise tolerated well No report of concerns or symptoms today  Goals Unmet:  Not Applicable  Comments: Pt able to follow exercise prescription today without complaint.  Will continue to monitor for progression.    Dr. Emily Filbert is Medical Director for [place].  Dr. Ottie Glazier is Medical Director for [facility] Pulmonary Rehabilitation.

## 2022-10-03 ENCOUNTER — Encounter: Payer: Medicare PPO | Admitting: *Deleted

## 2022-10-03 DIAGNOSIS — C9000 Multiple myeloma not having achieved remission: Secondary | ICD-10-CM | POA: Diagnosis not present

## 2022-10-03 DIAGNOSIS — I5042 Chronic combined systolic (congestive) and diastolic (congestive) heart failure: Secondary | ICD-10-CM

## 2022-10-03 NOTE — Progress Notes (Signed)
Daily Session Note  Patient Details  Name: Sarah [name] MRN: [id number] Date of Birth: 1955-09-22 Referring Provider:   Flowsheet Row Cardiac Rehab from 08/29/2022 in [facility] Cardiac and Pulmonary Rehab  Referring Provider Kandis Cocking, MD       Encounter Date: 10/03/2022  Check In:  Session Check In - 10/03/22 0841       Check-In   Supervising physician immediately available to respond to emergencies See telemetry face sheet for immediately available ER MD    Location [facility]-Cardiac & Pulmonary Rehab    Staff Present Darlyne Russian, RN, Lorin [place], MS, ACSM CEP, Exercise Physiologist;Joseph Tessie Fass, [place]    Virtual Visit No    Medication changes reported     No    Fall or balance concerns reported    No    Warm-up and Cool-down Performed on first and last piece of equipment    Resistance Training Performed Yes    VAD Patient? No    PAD/SET Patient? No      Pain Assessment   Currently in Pain? No/denies                Social History   Tobacco Use  Smoking Status Former   Packs/day: 1.00   Years: 20.00   Additional pack years: 0.00   Total pack years: 20.00   Types: Cigarettes   Quit date: 07/02/1991   Years since quitting: 31.2  Smokeless Tobacco Never    Goals Met:  Independence with exercise equipment Exercise tolerated well No report of concerns or symptoms today Strength training completed today  Goals Unmet:  Not Applicable  Comments: Pt able to follow exercise prescription today without complaint.  Will continue to monitor for progression.    Dr. Emily Filbert is Medical Director for [place].  Dr. Ottie Glazier is Medical Director for [facility] Pulmonary Rehabilitation.

## 2022-10-06 DIAGNOSIS — J0110 Acute frontal sinusitis, unspecified: Secondary | ICD-10-CM | POA: Insufficient documentation

## 2022-10-06 NOTE — Assessment & Plan Note (Signed)
Cough X 2 weeks. Cough- yellow and brown mucous. Tested for Covid and it was negative. Low grade fever in the beginning. No fever now. Sllight SOB when walking. Slight sore throat last week, but now its better.   Findings consistent with sinusitis, frontal, plan as follows: - Azithromycin - Promethazine DM

## 2022-10-06 NOTE — Progress Notes (Signed)
     Primary Care / Sports Medicine Office Visit  Patient Information:  Patient ID: Sarah [name], female DOB: [birth year] Age: 67 y.o. MRN: [id number]   Sarah [name] is a pleasant 67 y.o. female presenting with the following:  Chief Complaint  Patient presents with   Cough    X 2 weeks. Cough- yellow and brown mucous. Tested for Covid and it was negative. Low grade fever in the beginning. No fever now. Sllight SOB when walking. Slight sore throat last week, but now its better.    Vitals:   09/25/22 1552  BP: 122/80  Pulse: (!) 55  Temp: 98.7 F (37.1 C)  SpO2: 98%   Vitals:   09/25/22 1552  Weight: 157 lb (71.2 kg)  Height: 5' 4.5" (1.638 m)   Body mass index is 26.53 kg/m.  No results found.   Independent interpretation of notes and tests performed by another provider:   None  Procedures performed:   None  Pertinent History, Exam, Impression, and Recommendations:   [name] was seen today for cough.  Acute frontal sinusitis, recurrence not specified Assessment & Plan: Cough X 2 weeks. Cough- yellow and brown mucous. Tested for Covid and it was negative. Low grade fever in the beginning. No fever now. Sllight SOB when walking. Slight sore throat last week, but now its better.   Findings consistent with sinusitis, frontal, plan as follows: - Azithromycin - Promethazine DM  Orders: -     Azithromycin; Take 2 tablets on day 1, then 1 tablet daily on days 2 through 5  Dispense: 6 tablet; Refill: 0 -     Promethazine-DM; Take 5 mLs by mouth 4 (four) times daily as needed for cough.  Dispense: 118 mL; Refill: 0     Orders & Medications Meds ordered this encounter  Medications   azithromycin (ZITHROMAX) 250 MG tablet    Sig: Take 2 tablets on day 1, then 1 tablet daily on days 2 through 5    Dispense:  6 tablet    Refill:  0   promethazine-dextromethorphan (PROMETHAZINE-DM) 6.25-15 MG/5ML syrup    Sig: Take 5 mLs by mouth 4 (four) times daily as needed  for cough.    Dispense:  118 mL    Refill:  0   No orders of the defined types were placed in this encounter.    No follow-ups on file.     Jerrol Banana, MD, [facility]   Primary Care Sports Medicine Primary Care and Sports Medicine at [facility]

## 2022-10-07 ENCOUNTER — Inpatient Hospital Stay: Payer: Medicare PPO

## 2022-10-07 VITALS — BP 119/70 | HR 54 | Temp 96.8°F | Resp 20

## 2022-10-07 DIAGNOSIS — N1832 Chronic kidney disease, stage 3b: Secondary | ICD-10-CM | POA: Diagnosis not present

## 2022-10-07 DIAGNOSIS — C9001 Multiple myeloma in remission: Secondary | ICD-10-CM | POA: Diagnosis not present

## 2022-10-07 DIAGNOSIS — Z79899 Other long term (current) drug therapy: Secondary | ICD-10-CM | POA: Diagnosis not present

## 2022-10-07 DIAGNOSIS — C9000 Multiple myeloma not having achieved remission: Secondary | ICD-10-CM

## 2022-10-07 DIAGNOSIS — I2510 Atherosclerotic heart disease of native coronary artery without angina pectoris: Secondary | ICD-10-CM | POA: Diagnosis not present

## 2022-10-07 DIAGNOSIS — Z9221 Personal history of antineoplastic chemotherapy: Secondary | ICD-10-CM | POA: Diagnosis not present

## 2022-10-07 DIAGNOSIS — I129 Hypertensive chronic kidney disease with stage 1 through stage 4 chronic kidney disease, or unspecified chronic kidney disease: Secondary | ICD-10-CM | POA: Diagnosis not present

## 2022-10-07 LAB — MAGNESIUM: Magnesium: 1.3 mg/dL — ABNORMAL LOW (ref 1.7–2.4)

## 2022-10-07 MED ORDER — HEPARIN SOD (PORK) LOCK FLUSH 100 UNIT/ML IV SOLN
500.0000 [IU] | Freq: Once | INTRAVENOUS | Status: AC | PRN
  Administered 2022-10-07: 500 [IU]
  Filled 2022-10-07: qty 5

## 2022-10-07 MED ORDER — SODIUM CHLORIDE 0.9% FLUSH
10.0000 mL | Freq: Once | INTRAVENOUS | Status: DC | PRN
  Filled 2022-10-07: qty 10

## 2022-10-07 MED ORDER — SODIUM CHLORIDE 0.9 % IV SOLN
Freq: Once | INTRAVENOUS | Status: AC
  Filled 2022-10-07: qty 250

## 2022-10-07 MED ORDER — MAGNESIUM SULFATE 4 GM/100ML IV SOLN
4.0000 g | Freq: Once | INTRAVENOUS | Status: AC
  Administered 2022-10-07: 4 g via INTRAVENOUS
  Filled 2022-10-07: qty 100

## 2022-10-07 MED ORDER — SODIUM CHLORIDE 0.9 % IV SOLN
25.0000 mg | Freq: Once | INTRAVENOUS | Status: AC
  Administered 2022-10-07: 25 mg via INTRAVENOUS
  Filled 2022-10-07: qty 1

## 2022-10-07 MED ORDER — MAGNESIUM SULFATE 2 GM/50ML IV SOLN
2.0000 g | Freq: Once | INTRAVENOUS | Status: AC
  Administered 2022-10-07: 2 g via INTRAVENOUS
  Filled 2022-10-07: qty 50

## 2022-10-08 ENCOUNTER — Encounter: Payer: Medicare PPO | Admitting: *Deleted

## 2022-10-08 ENCOUNTER — Other Ambulatory Visit: Payer: Self-pay

## 2022-10-08 ENCOUNTER — Encounter: Payer: Self-pay | Admitting: Oncology

## 2022-10-08 DIAGNOSIS — C9000 Multiple myeloma not having achieved remission: Secondary | ICD-10-CM | POA: Diagnosis not present

## 2022-10-08 DIAGNOSIS — I5042 Chronic combined systolic (congestive) and diastolic (congestive) heart failure: Secondary | ICD-10-CM

## 2022-10-08 NOTE — Progress Notes (Signed)
Daily Session Note  Patient Details  Name: Sarah [name] MRN: [id number] Date of Birth: 04/04/1956 Referring Provider:   Flowsheet Row Cardiac Rehab from 08/29/2022 in [facility] Cardiac and Pulmonary Rehab  Referring Provider Joneen Roach, MD       Encounter Date: 10/08/2022  Check In:  Session Check In - 10/08/22 0810       Check-In   Supervising physician immediately available to respond to emergencies See telemetry face sheet for immediately available ER MD    Location [facility]-Cardiac & Pulmonary Rehab    Staff Present Cyndia Diver, RN, BSN, Collene Leyden, MS, ACSM CEP, Exercise Physiologist;Jessica Juanetta Gosling, MA, RCEP, CCRP, CCET    Virtual Visit No    Medication changes reported     No    Fall or balance concerns reported    No    Tobacco Cessation No Change    Warm-up and Cool-down Performed on first and last piece of equipment    Resistance Training Performed No    VAD Patient? No      Pain Assessment   Currently in Pain? No/denies    Multiple Pain Sites No                Social History   Tobacco Use  Smoking Status Former   Packs/day: 1.00   Years: 20.00   Additional pack years: 0.00   Total pack years: 20.00   Types: Cigarettes   Quit date: 07/02/1991   Years since quitting: 31.2  Smokeless Tobacco Never    Goals Met:  Independence with exercise equipment Exercise tolerated well Strength training completed today  Goals Unmet:  Not Applicable  Comments: Pt able to follow exercise prescription today without complaint.  Will continue to monitor for progression.    Dr. Bethann Punches is Medical Director for [facility] Cardiac Rehabilitation.  Dr. Vida Rigger is Medical Director for [facility] Pulmonary Rehabilitation.

## 2022-10-10 ENCOUNTER — Encounter: Payer: Medicare PPO | Admitting: *Deleted

## 2022-10-10 DIAGNOSIS — I5042 Chronic combined systolic (congestive) and diastolic (congestive) heart failure: Secondary | ICD-10-CM

## 2022-10-10 DIAGNOSIS — C9000 Multiple myeloma not having achieved remission: Secondary | ICD-10-CM | POA: Diagnosis not present

## 2022-10-10 NOTE — Progress Notes (Signed)
Daily Session Note  Patient Details  Name: Sarah [name] MRN: [id number] Date of Birth: 1955-07-29 Referring Provider:   Flowsheet Row Cardiac Rehab from 08/29/2022 in [facility] Cardiac and Pulmonary Rehab  Referring Provider Joneen Roach, MD       Encounter Date: 10/10/2022  Check In:  Session Check In - 10/10/22 0745       Check-In   Supervising physician immediately available to respond to emergencies See telemetry face sheet for immediately available ER MD    Location [facility]-Cardiac & Pulmonary Rehab    Staff Present Lanny Hurst, RN, ADN;Jessica Juanetta Gosling, MA, RCEP, CCRP, CCET;Joseph [place], [place]    Virtual Visit No    Medication changes reported     No    Fall or balance concerns reported    No    Warm-up and Cool-down Performed on first and last piece of equipment    Resistance Training Performed Yes    VAD Patient? No    PAD/SET Patient? No      Pain Assessment   Currently in Pain? No/denies                Social History   Tobacco Use  Smoking Status Former   Packs/day: 1.00   Years: 20.00   Additional pack years: 0.00   Total pack years: 20.00   Types: Cigarettes   Quit date: 07/02/1991   Years since quitting: 31.2  Smokeless Tobacco Never    Goals Met:  Independence with exercise equipment Exercise tolerated well No report of concerns or symptoms today Strength training completed today  Goals Unmet:  Not Applicable  Comments:  Shardonnay graduated today from  rehab with 10 sessions completed.  Details of the patient's exercise prescription and what She needs to do in order to continue the prescription and progress were discussed with patient.  Patient was given a copy of prescription and goals.  Patient verbalized understanding.  Alyiana will transfer to [facility].     Dr. Bethann Punches is Medical Director for [facility] Cardiac Rehabilitation.  Dr. Vida Rigger is Medical Director for [facility] Pulmonary Rehabilitation.

## 2022-10-10 NOTE — Progress Notes (Signed)
Cardiac Individual Treatment Plan  Patient Details  Name: Sarah [name] MRN: [id number] Date of Birth: 1956/01/08 Referring Provider:   Flowsheet Row Cardiac Rehab from 08/29/2022 in [facility] Cardiac and Pulmonary Rehab  Referring Provider Joneen Roach, MD       Initial Encounter Date:  Flowsheet Row Cardiac Rehab from 08/29/2022 in [facility] Cardiac and Pulmonary Rehab  Date 08/29/22       Visit Diagnosis: Heart failure, systolic and diastolic, chronic  Patient's Home Medications on Admission:  Current Outpatient Medications:    allopurinol (ZYLOPRIM) 100 MG tablet, TAKE 1 TABLET(100 MG) BY MOUTH DAILY as needed, Disp: 90 tablet, Rfl: 1   amiodarone (PACERONE) 200 MG tablet, Take 200 mg by mouth daily. Take 2 tabs daily for 3 weeks and then 1 tab daily, Disp: , Rfl:    atorvastatin (LIPITOR) 10 MG tablet, Take 0.5 tablets (5 mg total) by mouth daily., Disp: 15 tablet, Rfl: 11   bisoprolol (ZEBETA) 10 MG tablet, Take 1 tablet (10 mg total) by mouth daily., Disp: 90 tablet, Rfl: 1   calcium carbonate (OS-CAL) 1250 (500 Ca) MG chewable tablet, Chew 2 tablets by mouth daily., Disp: , Rfl:    diclofenac sodium (VOLTAREN) 1 % GEL, Apply 2 g topically 4 (four) times daily., Disp: 100 g, Rfl: 1   diphenoxylate-atropine (LOMOTIL) 2.5-0.025 MG tablet, Take 2 tablets by mouth 4 (four) times daily as needed for diarrhea or loose stools., Disp: 180 tablet, Rfl: 3   empagliflozin (JARDIANCE) 10 MG TABS tablet, Take 1 tablet by mouth daily., Disp: , Rfl:    ENTRESTO 24-26 MG, Take 1 tablet by mouth 2 (two) times daily., Disp: , Rfl:    furosemide (LASIX) 40 MG tablet, Take 0.5 tablets (20 mg total) by mouth daily. Home dose., Disp: , Rfl:    glipiZIDE (GLUCOTROL XL) 2.5 MG 24 hr tablet, Take 1 tablet (2.5 mg total) by mouth daily with breakfast., Disp: 30 tablet, Rfl: 0   glucose blood test strip, , Disp: , Rfl:    lidocaine (XYLOCAINE) 2 % solution, Use as directed 15 mLs in the mouth or throat every 3  (three) hours as needed for mouth pain (swish and spit)., Disp: 100 mL, Rfl: 0   magnesium chloride (SLOW-MAG) 64 MG TBEC SR tablet, Take 1 tablet (64 mg total) by mouth daily., Disp: 60 tablet, Rfl: 1   montelukast (SINGULAIR) 10 MG tablet, Take 1 tablet (10 mg total) by mouth See admin instructions. Take 1 tab daily for 2 days after daratumumab treatments, Disp: 30 tablet, Rfl: 1   Multiple Vitamin (MULTIVITAMIN) tablet, Take 1 tablet by mouth daily., Disp: , Rfl:    omeprazole (PRILOSEC) 40 MG capsule, TAKE 1 CAPSULE IN THE MORNING AND TAKE 1 CAPSULE AT BEDTIME, Disp: 180 capsule, Rfl: 1   pentoxifylline (TRENTAL) 400 MG CR tablet, Take 400 mg by mouth 3 (three) times daily with meals., Disp: , Rfl:    potassium chloride SA (KLOR-CON M) 20 MEQ tablet, Take 1 tablet (20 mEq total) by mouth daily. Home med, reduced from twice daily to daily., Disp: , Rfl: 0   promethazine (PHENERGAN) 25 MG tablet, Take 1 tablet (25 mg total) by mouth every 6 (six) hours as needed for nausea or vomiting., Disp: 90 tablet, Rfl: 1   promethazine-dextromethorphan (PROMETHAZINE-DM) 6.25-15 MG/5ML syrup, Take 5 mLs by mouth 4 (four) times daily as needed for cough., Disp: 118 mL, Rfl: 0   spironolactone (ALDACTONE) 25 MG tablet, Take by mouth., Disp: , Rfl:  tiZANidine (ZANAFLEX) 4 MG tablet, Take 1 tablet (4 mg total) by mouth every 6 (six) hours as needed for muscle spasms., Disp: 90 tablet, Rfl: 0   traMADol (ULTRAM) 50 MG tablet, , Disp: , Rfl:    traZODone (DESYREL) 100 MG tablet, TAKE 1 TABLET AT BEDTIME, Disp: 90 tablet, Rfl: 3   venlafaxine (EFFEXOR) 75 MG tablet, Take 75 mg by mouth daily., Disp: , Rfl:    vitamin E 45 MG (100 UNITS) capsule, Take by mouth daily., Disp: , Rfl:  No current facility-administered medications for this visit.  Facility-Administered Medications Ordered in Other Visits:    sodium chloride flush (NS) 0.9 % injection 10 mL, 10 mL, Intravenous, PRN, Rickard Patience, MD, 10 mL at 04/02/21  1610  Past Medical History: Past Medical History:  Diagnosis Date   Anemia    Anxiety    Aortic stenosis    a. 05/2020 Echo: EF 45-50%, sev AS - seen by TAVR team @ [facility] - CTA sugg of tricuspid valve w/ fusing of 2 leaflets-TAVR deferred in setting of acute infxn; b. 07/2020 Echo: EF 50-55%, mod-sev AS; c. 12/2020 Echo: EF>55%. Mod-sev paradoxical low-flow low-gradient AS; d. 08/2021 Echo: EF 35-40%, severe AS, triv AI, mild MR.   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 Variable EF over time; b. 08/2017 Echo: EF 40%; b. 03/2020 Echo: EF 55-60%; c. 05/2020 Echo: EF 45-50%; d. 07/2020 Echo: EF 50-55%; e. 12/2020 Echo: EF>55%; f. 08/2021 Echo: EF 35-40%.   Chronic heart failure with preserved ejection fraction (HFpEF) (HCC)    a. Variable EF over time; b. 08/2017 Echo: EF 40%; b. 03/2020 Echo: EF 55-60%; c. 05/2020 Echo: EF 45-50%; d. 07/2020 Echo: EF 50-55%; e. 12/2020 Echo: EF>55%; f. 08/2021 Echo: EF 35-40%, no RV, mildly dil LA, mild MR, triv AI, severe AS.   CKD (chronic kidney disease) stage 3, GFR 30-59 ml/min (HCC)    Depression    Diabetes mellitus (HCC)    Dizziness    Fatty liver    Frequent falls    GERD (gastroesophageal reflux disease)    Gout    Heart murmur    History of blood transfusion    History of bone marrow transplant (HCC)    History of uterine fibroid    Hx of cardiac catheterization    a. 01/2016 Cath [facility] - after abnl nuc): Nl cors.   Hypertension    Hypomagnesemia    IDA (iron deficiency anemia)    Multiple myeloma (HCC)    Personal history of chemotherapy    PSVT (paroxysmal supraventricular tachycardia)    a. S/p RFCA [facility]).   PVC's (premature ventricular contractions)    a. Well-managed w/ bisoprolol in outpt setting.   Renal cyst     Tobacco Use: Social History   Tobacco Use  Smoking Status Former   Packs/day: 1.00    Years: 20.00   Additional pack years: 0.00   Total pack years: 20.00   Types: Cigarettes   Quit date: 07/02/1991   Years since quitting: 31.2  Smokeless Tobacco Never    Labs: Review Flowsheet  More data exists      Latest Ref Rng & Units 08/21/2021 08/24/2021 12/07/2021 02/05/2022 06/12/2022  Labs for ITP Cardiac and Pulmonary Rehab  Cholestrol 0 - 200 mg/dL 960  99  454  098  -  LDL (  calc) 0 - 99 mg/dL 58  44  98  91  -  HDL-C >40 mg/dL 48  40  59  67  -  Trlycerides <150 mg/dL 61  77  76  87  -  Hemoglobin A1c 4.8 - 5.6 % 7.5  - 6.9  - 6.6      Exercise Target Goals: Exercise Program Goal: Individual exercise prescription set using results from initial 6 min walk test and THRR while considering  patient's activity barriers and safety.   Exercise Prescription Goal: Initial exercise prescription builds to 30-45 minutes a day of aerobic activity, 2-3 days per week.  Home exercise guidelines will be given to patient during program as part of exercise prescription that the participant will acknowledge.   Education: Aerobic Exercise: - Group verbal and visual presentation on the components of exercise prescription. Introduces F.I.T.T principle from ACSM for exercise prescriptions.  Reviews F.I.T.T. principles of aerobic exercise including progression. Written material given at graduation. Flowsheet Row Cardiac Rehab from 10/10/2022 in [facility] Cardiac and Pulmonary Rehab  Education need identified 08/29/22  Date 09/26/22  Educator [facility]  Instruction Review Code 1- Verbalizes Understanding       Education: Resistance Exercise: - Group verbal and visual presentation on the components of exercise prescription. Introduces F.I.T.T principle from ACSM for exercise prescriptions  Reviews F.I.T.T. principles of resistance exercise including progression. Written material given at graduation. Flowsheet Row Cardiac Rehab from 10/10/2022 in [facility] Cardiac and Pulmonary Rehab  Date 10/03/22  Educator NT   Instruction Review Code 1- Verbalizes Understanding        Education: Exercise & Equipment Safety: - Individual verbal instruction and demonstration of equipment use and safety with use of the equipment. Flowsheet Row Cardiac Rehab from 10/10/2022 in [facility] Cardiac and Pulmonary Rehab  Date 08/29/22  Educator NT  Instruction Review Code 1- Verbalizes Understanding       Education: Exercise Physiology & General Exercise Guidelines: - Group verbal and written instruction with models to review the exercise physiology of the cardiovascular system and associated critical values. Provides general exercise guidelines with specific guidelines to those with heart or lung disease.  Flowsheet Row Cardiac Rehab from 10/10/2022 in [facility] Cardiac and Pulmonary Rehab  Date 09/19/22  Educator [facility]  Instruction Review Code 1- Verbalizes Understanding       Education: Flexibility, Balance, Mind/Body Relaxation: - Group verbal and visual presentation with interactive activity on the components of exercise prescription. Introduces F.I.T.T principle from ACSM for exercise prescriptions. Reviews F.I.T.T. principles of flexibility and balance exercise training including progression. Also discusses the mind body connection.  Reviews various relaxation techniques to help reduce and manage stress (i.e. Deep breathing, progressive muscle relaxation, and visualization). Balance handout provided to take home. Written material given at graduation. Flowsheet Row Cardiac Rehab from 10/10/2022 in [facility] Cardiac and Pulmonary Rehab  Date 10/03/22  Educator NT  Instruction Review Code 1- Verbalizes Understanding       Activity Barriers & Risk Stratification:  Activity Barriers & Cardiac Risk Stratification - 08/29/22 1347       Activity Barriers & Cardiac Risk Stratification   Activity Barriers Back Problems;Balance Concerns   lower back pain, sciatic pain, carpal tunnel   Cardiac Risk Stratification High              6 Minute Walk:  6 Minute Walk     Row Name 08/29/22 1343         6 Minute Walk   Phase Initial  Distance 1095 feet     Walk Time 6 minutes     # of Rest Breaks 0     MPH 2.07     METS 2.67     RPE 9     Perceived Dyspnea  1     VO2 Peak 9.34     Symptoms Yes (comment)     Comments R leg numbness, Low Back pain 7/10     Resting HR 67 bpm     Resting BP 112/62     Resting Oxygen Saturation  99 %     Exercise Oxygen Saturation  during 6 min walk 98 %     Max Ex. HR 89 bpm     Max Ex. BP 120/64     2 Minute Post BP 110/62              Oxygen Initial Assessment:   Oxygen Re-Evaluation:   Oxygen Discharge (Final Oxygen Re-Evaluation):   Initial Exercise Prescription:  Initial Exercise Prescription - 08/29/22 1400       Date of Initial Exercise RX and Referring Provider   Date 08/29/22    Referring Provider Joneen Roach, MD      Oxygen   Maintain Oxygen Saturation 88% or higher      Treadmill   MPH 1.8    Grade 0    Minutes 15    METs 2.38      NuStep   Level 2    SPM 80    Minutes 15    METs 2.67      REL-XR   Level 1    Speed 50    Minutes 15    METs 2.67      Track   Laps 30    Minutes 15    METs 2.63      Prescription Details   Frequency (times per week) 2    Duration Progress to 30 minutes of continuous aerobic without signs/symptoms of physical distress      Intensity   THRR 40-80% of Max Heartrate 102-137    Ratings of Perceived Exertion 11-13    Perceived Dyspnea 0-4      Progression   Progression Continue to progress workloads to maintain intensity without signs/symptoms of physical distress.      Resistance Training   Training Prescription Yes    Weight 2 lb    Reps 10-15             Perform Capillary Blood Glucose checks as needed.  Exercise Prescription Changes:   Exercise Prescription Changes     Row Name 08/29/22 1400 09/26/22 1400 10/09/22 1600         Response to Exercise   Blood Pressure  (Admit) 112/62 110/72 116/68     Blood Pressure (Exercise) 120/64 128/62 120/62     Blood Pressure (Exit) 110/62 94/64 98/58      Heart Rate (Admit) 67 bpm 56 bpm 54 bpm     Heart Rate (Exercise) 89 bpm 94 bpm 91 bpm     Heart Rate (Exit) 68 bpm 68 bpm 61 bpm     Oxygen Saturation (Admit) 99 % -- --     Oxygen Saturation (Exercise) 98 % -- --     Rating of Perceived Exertion (Exercise) 9 12 15      Perceived Dyspnea (Exercise) 1 -- --     Symptoms R leg numbness, Low back pain 7/10 none none     Comments Results First full day  of exercise --     Duration -- Progress to 30 minutes of  aerobic without signs/symptoms of physical distress Continue with 30 min of aerobic exercise without signs/symptoms of physical distress.     Intensity -- THRR unchanged THRR unchanged       Progression   Progression -- Continue to progress workloads to maintain intensity without signs/symptoms of physical distress. Continue to progress workloads to maintain intensity without signs/symptoms of physical distress.     Average METs -- 2.14 2.56       Resistance Training   Training Prescription -- Yes Yes     Weight -- 2 lb 2 lb     Reps -- 10-15 10-15       Interval Training   Interval Training -- No No       Treadmill   MPH -- -- 2     Grade -- -- 1     Minutes -- -- 15     METs -- -- 2.81       NuStep   Level -- 2 --     Minutes -- 15 --     METs -- 2.4 --       REL-XR   Level -- -- 1     Minutes -- -- 15     METs -- -- 2.9       T5 Nustep   Level -- -- 1     Minutes -- -- 15     METs -- -- 1.8       Track   Laps -- 16 42     Minutes -- 15 15     METs -- 1.87 3.28       Oxygen   Maintain Oxygen Saturation -- 88% or higher 88% or higher              Exercise Comments:   Exercise Comments     Row Name 09/19/22 0806           Exercise Comments First full day of exercise!  Patient was oriented to gym and equipment including functions, settings, policies, and  procedures.  Patient's individual exercise prescription and treatment plan were reviewed.  All starting workloads were established based on the results of the 6 minute walk test done at initial orientation visit.  The plan for exercise progression was also introduced and progression will be customized based on patient's performance and goals.                Exercise Goals and Review:   Exercise Goals     Row Name 08/29/22 1349             Exercise Goals   Increase Physical Activity Yes       Intervention Provide advice, education, support and counseling about physical activity/exercise needs.;Develop an individualized exercise prescription for aerobic and resistive training based on initial evaluation findings, risk stratification, comorbidities and participant's personal goals.       Expected Outcomes Short Term: Attend rehab on a regular basis to increase amount of physical activity.;Long Term: Add in home exercise to make exercise part of routine and to increase amount of physical activity.;Long Term: Exercising regularly at least 3-5 days a week.       Increase Strength and Stamina Yes       Intervention Provide advice, education, support and counseling about physical activity/exercise needs.;Develop an individualized exercise prescription for aerobic and resistive training based on initial evaluation findings, risk stratification,  comorbidities and participant's personal goals.       Expected Outcomes Short Term: Perform resistance training exercises routinely during rehab and add in resistance training at home;Short Term: Increase workloads from initial exercise prescription for resistance, speed, and METs.;Long Term: Improve cardiorespiratory fitness, muscular endurance and strength as measured by increased METs and functional capacity (6MWT)       Able to understand and use rate of perceived exertion (RPE) scale Yes       Intervention Provide education and explanation on how to use  RPE scale       Expected Outcomes Short Term: Able to use RPE daily in rehab to express subjective intensity level;Long Term:  Able to use RPE to guide intensity level when exercising independently       Able to understand and use Dyspnea scale Yes       Intervention Provide education and explanation on how to use Dyspnea scale       Expected Outcomes Short Term: Able to use Dyspnea scale daily in rehab to express subjective sense of shortness of breath during exertion;Long Term: Able to use Dyspnea scale to guide intensity level when exercising independently       Knowledge and understanding of Target Heart Rate Range (THRR) Yes       Intervention Provide education and explanation of THRR including how the numbers were predicted and where they are located for reference       Expected Outcomes Long Term: Able to use THRR to govern intensity when exercising independently;Short Term: Able to state/look up THRR;Short Term: Able to use daily as guideline for intensity in rehab       Able to check pulse independently Yes       Intervention Review the importance of being able to check your own pulse for safety during independent exercise;Provide education and demonstration on how to check pulse in carotid and radial arteries.       Expected Outcomes Short Term: Able to explain why pulse checking is important during independent exercise;Long Term: Able to check pulse independently and accurately       Understanding of Exercise Prescription Yes       Intervention Provide education, explanation, and written materials on patient's individual exercise prescription       Expected Outcomes Short Term: Able to explain program exercise prescription;Long Term: Able to explain home exercise prescription to exercise independently                Exercise Goals Re-Evaluation :  Exercise Goals Re-Evaluation     Row Name 09/19/22 0806 09/26/22 1420 10/08/22 0825 10/09/22 1700       Exercise Goal Re-Evaluation    Exercise Goals Review Able to understand and use rate of perceived exertion (RPE) scale;Able to understand and use Dyspnea scale;Knowledge and understanding of Target Heart Rate Range (THRR);Understanding of Exercise Prescription Increase Physical Activity;Increase Strength and Stamina;Understanding of Exercise Prescription Increase Physical Activity;Increase Strength and Stamina;Understanding of Exercise Prescription Increase Physical Activity;Increase Strength and Stamina;Understanding of Exercise Prescription    Comments Reviewed RPE scale, THR and program prescription with pt today.  Pt voiced understanding and was given a copy of goals to take home. Elnita MaxwellCheryl is off to a good start in the program. During her first day of rehab she was able to walk 16 laps on the track and work on the T4 nustep at level 2. She also did well with 2 lb handweights for resistance training. We will continue to monitor her progress  in the program. Tamma is planning to transisition to CARE program as soon as she can.  We will get her scheduled in there to be able to continue to exercise. Emonie continues to do well in rehab. She did increase her treadmill workload to 2/1% and was also able to walk 42 laps on the track to equal a mile! She plans on transitioning to the CARE program and will help facilitate that for her.    Expected Outcomes Short: Use RPE daily to regulate intensity. Long: Follow program prescription in THR. Short: Conitnue to attend cardiac rehab and follow current exercise prescription. Long: Continue to improve strength and stamina. Short: Graduate Cardiac and move to Care Long: COnitnue to exercise to improve stamina Short: Continue to increase workloads until CARE program Long: Continue to increase overall MET level and stamina             Discharge Exercise Prescription (Final Exercise Prescription Changes):  Exercise Prescription Changes - 10/09/22 1600       Response to Exercise   Blood Pressure  (Admit) 116/68    Blood Pressure (Exercise) 120/62    Blood Pressure (Exit) 98/58    Heart Rate (Admit) 54 bpm    Heart Rate (Exercise) 91 bpm    Heart Rate (Exit) 61 bpm    Rating of Perceived Exertion (Exercise) 15    Symptoms none    Duration Continue with 30 min of aerobic exercise without signs/symptoms of physical distress.    Intensity THRR unchanged      Progression   Progression Continue to progress workloads to maintain intensity without signs/symptoms of physical distress.    Average METs 2.56      Resistance Training   Training Prescription Yes    Weight 2 lb    Reps 10-15      Interval Training   Interval Training No      Treadmill   MPH 2    Grade 1    Minutes 15    METs 2.81      REL-XR   Level 1    Minutes 15    METs 2.9      T5 Nustep   Level 1    Minutes 15    METs 1.8      Track   Laps 42    Minutes 15    METs 3.28      Oxygen   Maintain Oxygen Saturation 88% or higher             Nutrition:  Target Goals: Understanding of nutrition guidelines, daily intake of sodium 1500mg , cholesterol 200mg , calories 30% from fat and 7% or less from saturated fats, daily to have 5 or more servings of fruits and vegetables.  Education: All About Nutrition: -Group instruction provided by verbal, written material, interactive activities, discussions, models, and posters to present general guidelines for heart healthy nutrition including fat, fiber, MyPlate, the role of sodium in heart healthy nutrition, utilization of the nutrition label, and utilization of this knowledge for meal planning. Follow up email sent as well. Written material given at graduation. Flowsheet Row Cardiac Rehab from 10/10/2022 in [facility] Cardiac and Pulmonary Rehab  Education need identified 08/29/22  Date 10/10/22  Educator [facility]  Instruction Review Code 1- Verbalizes Understanding       Biometrics:  Pre Biometrics - 08/29/22 1350       Pre Biometrics   Height 5' 4.5" (1.638  m)    Weight 153 lb 11.2 oz (69.7  kg)    Waist Circumference 33 inches    Hip Circumference 40 inches    Waist to Hip Ratio 0.83 %    BMI (Calculated) 25.98    Single Leg Stand 3.7 seconds              Nutrition Therapy Plan and Nutrition Goals:  Nutrition Therapy & Goals - 10/08/22 0830       Nutrition Therapy   Diet Heart healthy, low Na, T2DM MNT    Protein (specify units) 80-85g    Fiber 25 grams    Whole Grain Foods 3 servings    Saturated Fats 12 max. grams    Fruits and Vegetables 8 servings/day    Sodium 2 grams      Personal Nutrition Goals   Nutrition Goal ST: include whole grains 1/2 the time she eats them - for example whole wheat bread with breakfast, limit processed meats <1-2x/week, include more plant protein such as peanut butter with oatmeal or smashing white beans into her potatoes LT: maintain A1C <7, include 20-25g of fiber/day, follow MyPlate guidelines    Comments 67 y.o. F admitted to cardiac rehab for chronic systolic HF. PMHx includes T2DM, CAD, NSTEMI (2023), asthma, IBS, HLD, stg 3b CKD, anemia, B12 deficiency, osteonecrosis of the jaw, multiple myeloma not having achieved remission. Medications reviewed atorvastatin, jardiance, glipizide, furosemide, magnesium, MVI with minerals, omeprazole, potassium, promethazine, tramadol, trazodone, vitamin E. Dorethea reports that she has gained her weight back since losing weight during cancer treatment over a year ago. She reports that she still has active cancer, but had to stop the continued treatments due to complications. Suhavi reports feeling that she is doing well overall. B: having eggs with toast and sausage or oatmeal or grits. L: small snack of cheese and crackers D: her and her sister cook - protein like chicken, beef, and sometimes fish with baked potato and non-starchy vegetables (she reports that she cannot chew raw salads right now due to not having bottom teeth). She rpeorts using olive oil when cooking.  Drinks: 2 cans of diet soda and water. Discussed general heart healthy eating and T2DM MNT. Encouraged whole grains at least 1/2 the time she has grains such as using whole wheat bread with her breakfast and using whole grain crackers or whole grain pita as part of her snack. Encouraged limiting processed meat and including more plant protein such as adding peanut butter to her oatmeal and adding in beans to meals at dinner - such as mashing white beans into potatoes.      Intervention Plan   Intervention Prescribe, educate and counsel regarding individualized specific dietary modifications aiming towards targeted core components such as weight, hypertension, lipid management, diabetes, heart failure and other comorbidities.    Expected Outcomes Long Term Goal: Adherence to prescribed nutrition plan.;Short Term Goal: Understand basic principles of dietary content, such as calories, fat, sodium, cholesterol and nutrients.;Short Term Goal: A plan has been developed with personal nutrition goals set during dietitian appointment.             Nutrition Assessments:  MEDIFICTS Score Key: ?70 Need to make dietary changes  40-70 Heart Healthy Diet ? 40 Therapeutic Level Cholesterol Diet  Flowsheet Row Cardiac Rehab from 08/29/2022 in [facility]  Picture Your Plate Total Score on Admission 48      Picture Your Plate Scores: <58 Unhealthy dietary pattern with much room for improvement. 41-50 Dietary pattern unlikely to meet recommendations for good health  and room for improvement. 51-60 More healthful dietary pattern, with some room for improvement.  >60 Healthy dietary pattern, although there may be some specific behaviors that could be improved.    Nutrition Goals Re-Evaluation:   Nutrition Goals Discharge (Final Nutrition Goals Re-Evaluation):   Psychosocial: Target Goals: Acknowledge presence or absence of significant depression and/or stress, maximize coping  skills, provide positive support system. Participant is able to verbalize types and ability to use techniques and skills needed for reducing stress and depression.   Education: Stress, Anxiety, and Depression - Group verbal and visual presentation to define topics covered.  Reviews how body is impacted by stress, anxiety, and depression.  Also discusses healthy ways to reduce stress and to treat/manage anxiety and depression.  Written material given at graduation.   Education: Sleep Hygiene -Provides group verbal and written instruction about how sleep can affect your health.  Define sleep hygiene, discuss sleep cycles and impact of sleep habits. Review good sleep hygiene tips.    Initial Review & Psychosocial Screening:  Initial Psych Review & Screening - 08/27/22 1310       Initial Review   Current issues with Current Stress Concerns    Source of Stress Concerns Chronic Illness;Family      Family Dynamics   Good Support System? Yes   sister     Barriers   Psychosocial barriers to participate in program There are no identifiable barriers or psychosocial needs.;The patient should benefit from training in stress management and relaxation.      Screening Interventions   Interventions Encouraged to exercise;Provide feedback about the scores to participant;To provide support and resources with identified psychosocial needs    Expected Outcomes Short Term goal: Utilizing psychosocial counselor, staff and physician to assist with identification of specific Stressors or current issues interfering with healing process. Setting desired goal for each stressor or current issue identified.;Long Term Goal: Stressors or current issues are controlled or eliminated.;Short Term goal: Identification and review with participant of any Quality of Life or Depression concerns found by scoring the questionnaire.;Long Term goal: The participant improves quality of Life and PHQ9 Scores as seen by post scores and/or  verbalization of changes             Quality of Life Scores:   Quality of Life - 08/29/22 1335       Quality of Life   Select Quality of Life      Quality of Life Scores   Health/Function Pre 22.1 %    Socioeconomic Pre 21.19 %    Psych/Spiritual Pre 24.86 %    Family Pre 20.4 %    GLOBAL Pre 22.2 %            Scores of 19 and below usually indicate a poorer quality of life in these areas.  A difference of  2-3 points is a clinically meaningful difference.  A difference of 2-3 points in the total score of the Quality of Life Index has been associated with significant improvement in overall quality of life, self-image, physical symptoms, and general health in studies assessing change in quality of life.  PHQ-9: Review Flowsheet  More data exists      09/26/2022 08/29/2022 03/01/2022 12/07/2021 06/22/2021  Depression screen PHQ 2/9  Decreased Interest 2 2 2 2 1   Down, Depressed, Hopeless 0 1 1 2 1   PHQ - 2 Score 2 3 3 4 2   Altered sleeping 2 1 3 2 1   Tired, decreased energy 3 3 3  0 1  Change in appetite 1 1 0 0 1  Feeling bad or failure about yourself  0 0 0 2 0  Trouble concentrating 0 0 1 0 1  Moving slowly or fidgety/restless 0 0 0 0 0  Suicidal thoughts 0 0 0 0 0  PHQ-9 Score 8 8 10 8 6   Difficult doing work/chores Not difficult at all Somewhat difficult Somewhat difficult - Not difficult at all   Interpretation of Total Score  Total Score Depression Severity:  1-4 = Minimal depression, 5-9 = Mild depression, 10-14 = Moderate depression, 15-19 = Moderately severe depression, 20-27 = Severe depression   Psychosocial Evaluation and Intervention:  Psychosocial Evaluation - 08/27/22 1317       Psychosocial Evaluation & Interventions   Interventions Encouraged to exercise with the program and follow exercise prescription    Comments Vernia is coming to cardiac rehab with the diagnosis of heart failure. She also is currently receiving treatment for multiple myeloma.  She does have a copay and does not think she will be able to cover the whole program, but is interested in the CARE program. She mentions she is always under stress when it comes to her health and family, but that she tries to stay positive and takes it one day at a time. Her sister is a huge support and she also loves her 5 cats and 5 dogs.    Expected Outcomes Short: attend cardiac rehab for education and exercise. Long: develop and maintain positive self care habits.    Continue Psychosocial Services  Follow up required by staff             Psychosocial Re-Evaluation:  Psychosocial Re-Evaluation     Row Name 09/26/22 [phone]             Psychosocial Re-Evaluation   Comments Reviewed patient health questionnaire (PHQ-9) with patient for follow up. Previously, patients score indicated signs/symptoms of depression.  Reviewed to see if patient is improving symptom wise while in program.  Score stayed the same and patient states that it is because she has just started rehab and has not had time to improve her health.       Expected Outcomes Short: Continue to work toward an improvement in PHQ9 scores by attending [facility] regularly. Long: Continue to improve stress and depression coping skills by talking with staff and attending [facility] regularly and work toward a positive mental state.       Continue Psychosocial Services  Follow up required by staff                Psychosocial Discharge (Final Psychosocial Re-Evaluation):  Psychosocial Re-Evaluation - 09/26/22 0819       Psychosocial Re-Evaluation   Comments Reviewed patient health questionnaire (PHQ-9) with patient for follow up. Previously, patients score indicated signs/symptoms of depression.  Reviewed to see if patient is improving symptom wise while in program.  Score stayed the same and patient states that it is because she has just started rehab and has not had time to improve her health.    Expected Outcomes Short:  Continue to work toward an improvement in PHQ9 scores by attending [facility] regularly. Long: Continue to improve stress and depression coping skills by talking with staff and attending [facility] regularly and work toward a positive mental state.    Continue Psychosocial Services  Follow up required by staff             Vocational Rehabilitation: Provide vocational rehab assistance to  qualifying candidates.   Vocational Rehab Evaluation & Intervention:  Vocational Rehab - 08/27/22 1310       Initial Vocational Rehab Evaluation & Intervention   Assessment shows need for Vocational Rehabilitation No             Education: Education Goals: Education classes will be provided on a variety of topics geared toward better understanding of heart health and risk factor modification. Participant will state understanding/return demonstration of topics presented as noted by education test scores.  Learning Barriers/Preferences:  Learning Barriers/Preferences - 08/27/22 1310       Learning Barriers/Preferences   Learning Barriers None    Learning Preferences None             General Cardiac Education Topics:  AED/CPR: - Group verbal and written instruction with the use of models to demonstrate the basic use of the AED with the basic ABC's of resuscitation.   Anatomy and Cardiac Procedures: - Group verbal and visual presentation and models provide information about basic cardiac anatomy and function. Reviews the testing methods done to diagnose heart disease and the outcomes of the test results. Describes the treatment choices: Medical Management, Angioplasty, or Coronary Bypass Surgery for treating various heart conditions including Myocardial Infarction, Angina, Valve Disease, and Cardiac Arrhythmias.  Written material given at graduation. Flowsheet Row Cardiac Rehab from 10/10/2022 in [facility] Card[facility] Pul[facility] Re[facility]  Education need identified 08/29/22       Medication  Safety: - Group verbal and visual instruction to review commonly prescribed medications for heart and lung disease. Reviews the medication, class of the drug, and side effects. Includes the steps to properly store meds and maintain the prescription regimen.  Written material given at graduation.   Intimacy: - Group verbal instruction through game format to discuss how heart and lung disease can affect sexual intimacy. Written material given at graduation.. Flowsheet Row Cardiac Rehab from 10/10/2022 in [facility] Cardiac and Pulmonary Rehab  Date 09/26/22  Educator [facility]  Instruction Review Code 1- Verbalizes Understanding       Know Your Numbers and Heart Failure: - Group verbal and visual instruction to discuss disease risk factors for cardiac and pulmonary disease and treatment options.  Reviews associated critical values for Overweight/Obesity, Hypertension, Cholesterol, and Diabetes.  Discusses basics of heart failure: signs/symptoms and treatments.  Introduces Heart Failure Zone chart for action plan for heart failure.  Written material given at graduation.   Infection Prevention: - Provides verbal and written material to individual with discussion of infection control including proper hand washing and proper equipment cleaning during exercise session. Flowsheet Row Cardiac Rehab from 10/10/2022 in [facility] Card[facility] Pul[facility] Rehab  Date 08/29/22  Educator NT  Instruction Review Code 1- Verbalizes Understanding       Falls Prevention: - Provides verbal and written material to individual with discussion of falls prevention and safety. Flowsheet Row Cardiac Rehab from 10/10/2022 in [facility] Cardiac and Pulmonary Rehab  Date 08/29/22  Educator NT  Instruction Review Code 1- Verbalizes Understanding       Other: -Provides group and verbal instruction on various topics (see comments)   Knowledge Questionnaire Score:  Knowledge Questionnaire Score - 08/29/22 1334       Knowledge  Questionnaire Score   Pre Score 23/26             Core Components/Risk Factors/Patient Goals at Admission:  Personal Goals and Risk Factors at Admission - 08/27/22 1311       Core Components/Risk Factors/Patient Goals  on Admission   Diabetes Yes    Intervention Provide education about signs/symptoms and action to take for hypo/hyperglycemia.;Provide education about proper nutrition, including hydration, and aerobic/resistive exercise prescription along with prescribed medications to achieve blood glucose in normal ranges: Fasting glucose 65-99 mg/dL    Expected Outcomes Short Term: Participant verbalizes understanding of the signs/symptoms and immediate care of hyper/hypoglycemia, proper foot care and importance of medication, aerobic/resistive exercise and nutrition plan for blood glucose control.;Long Term: Attainment of HbA1C < 7%.    Heart Failure Yes    Intervention Provide a combined exercise and nutrition program that is supplemented with education, support and counseling about heart failure. Directed toward relieving symptoms such as shortness of breath, decreased exercise tolerance, and extremity edema.    Expected Outcomes Improve functional capacity of life;Short term: Attendance in program 2-3 days a week with increased exercise capacity. Reported lower sodium intake. Reported increased fruit and vegetable intake. Reports medication compliance.;Short term: Daily weights obtained and reported for increase. Utilizing diuretic protocols set by physician.;Long term: Adoption of self-care skills and reduction of barriers for early signs and symptoms recognition and intervention leading to self-care maintenance.    Hypertension Yes    Intervention Provide education on lifestyle modifcations including regular physical activity/exercise, weight management, moderate sodium restriction and increased consumption of fresh fruit, vegetables, and low fat dairy, alcohol moderation, and smoking  cessation.;Monitor prescription use compliance.    Expected Outcomes Short Term: Continued assessment and intervention until BP is < 140/87mm HG in hypertensive participants. < 130/70mm HG in hypertensive participants with diabetes, heart failure or chronic kidney disease.;Long Term: Maintenance of blood pressure at goal levels.    Lipids Yes    Intervention Provide education and support for participant on nutrition & aerobic/resistive exercise along with prescribed medications to achieve LDL 70mg , HDL >40mg .    Expected Outcomes Short Term: Participant states understanding of desired cholesterol values and is compliant with medications prescribed. Participant is following exercise prescription and nutrition guidelines.;Long Term: Cholesterol controlled with medications as prescribed, with individualized exercise RX and with personalized nutrition plan. Value goals: LDL < 70mg , HDL > 40 mg.             Education:Diabetes - Individual verbal and written instruction to review signs/symptoms of diabetes, desired ranges of glucose level fasting, after meals and with exercise. Acknowledge that pre and post exercise glucose checks will be done for 3 sessions at entry of program. Flowsheet Row Cardiac Rehab from 10/10/2022 in [facility] Cardiac and Pulmonary Rehab  Date 08/27/22  Educator [facility]  Instruction Review Code 1- Verbalizes Understanding       Core Components/Risk Factors/Patient Goals Review:   Goals and Risk Factor Review     Row Name 10/08/22 0827             Core Components/Risk Factors/Patient Goals Review   Personal Goals Review Weight Management/Obesity;Diabetes;Hypertension       Review Briannah is doing well in rehab. She is hoping to transition to CARE program as soon as we get her order.  Her weight was up today and she thinks she has some fluid on board. She says she fluctuates between 4-5 lbs each day.  Her blood pressures are doing well in class. She has not been checking it at  home.  She has lost her glucometer and going to get kids to help her find it.  She has not had any swings.       Expected Outcomes Short: Find glucometer to keep  checking sugars Long: Conitnue to work on weight loss and fluid control                Core Components/Risk Factors/Patient Goals at Discharge (Final Review):   Goals and Risk Factor Review - 10/08/22 0827       Core Components/Risk Factors/Patient Goals Review   Personal Goals Review Weight Management/Obesity;Diabetes;Hypertension    Review Guila is doing well in rehab. She is hoping to transition to CARE program as soon as we get her order.  Her weight was up today and she thinks she has some fluid on board. She says she fluctuates between 4-5 lbs each day.  Her blood pressures are doing well in class. She has not been checking it at home.  She has lost her glucometer and going to get kids to help her find it.  She has not had any swings.    Expected Outcomes Short: Find glucometer to keep checking sugars Long: Conitnue to work on weight loss and fluid control             ITP Comments:  ITP Comments     Row Name 08/27/22 1329 08/29/22 1244 09/18/22 0942 09/19/22 0806 10/10/22 0823   ITP Comments Initial phone call completed. Diagnosis can be found in [facility] 2/22. EP Orientation scheduled for Thursday 2/29 at 9:30. Completed and gym orientation. Initial ITP created and sent for review to Dr. Bethann Punches, Medical Director. 30 Day review completed. Medical Director ITP review done, changes made as directed, and signed approval by Medical Director.    new to prgram First full day of exercise!  Patient was oriented to gym and equipment including functions, settings, policies, and procedures.  Patient's individual exercise prescription and treatment plan were reviewed.  All starting workloads were established based on the results of the 6 minute walk test done at initial orientation visit.  The plan for exercise progression was  also introduced and progression will be customized based on patient's performance and goals. Dafna graduated today from  rehab with 10 sessions completed.  Details of the patient's exercise prescription and what She needs to do in order to continue the prescription and progress were discussed with patient.  Patient was given a copy of prescription and goals.  Patient verbalized understanding.  Oumou will transfer to CARE.            Comments: Discharge ITP

## 2022-10-10 NOTE — Progress Notes (Signed)
Discharge Summary: Sarah [name] (DOB: Jan 27, 1956)  Sarah [name] graduated today from  rehab with 10 sessions completed.  Details of the Sarah [name]'s exercise prescription and what Sarah [name] needs to do in order to continue the prescription and progress were discussed with Sarah [name].  Sarah [name] was given a copy of prescription and goals.  Sarah [name] verbalized understanding.  Sarah [name] will transfer to CARE.

## 2022-10-14 ENCOUNTER — Inpatient Hospital Stay: Payer: Medicare PPO

## 2022-10-14 DIAGNOSIS — F32A Depression, unspecified: Secondary | ICD-10-CM | POA: Diagnosis not present

## 2022-10-14 DIAGNOSIS — I4891 Unspecified atrial fibrillation: Secondary | ICD-10-CM | POA: Diagnosis not present

## 2022-10-14 DIAGNOSIS — I493 Ventricular premature depolarization: Secondary | ICD-10-CM | POA: Diagnosis not present

## 2022-10-14 DIAGNOSIS — C9000 Multiple myeloma not having achieved remission: Secondary | ICD-10-CM | POA: Diagnosis not present

## 2022-10-14 DIAGNOSIS — N189 Chronic kidney disease, unspecified: Secondary | ICD-10-CM | POA: Diagnosis not present

## 2022-10-14 DIAGNOSIS — E1122 Type 2 diabetes mellitus with diabetic chronic kidney disease: Secondary | ICD-10-CM | POA: Diagnosis not present

## 2022-10-14 DIAGNOSIS — F1090 Alcohol use, unspecified, uncomplicated: Secondary | ICD-10-CM | POA: Diagnosis not present

## 2022-10-14 DIAGNOSIS — Z79899 Other long term (current) drug therapy: Secondary | ICD-10-CM | POA: Diagnosis not present

## 2022-10-14 DIAGNOSIS — Z885 Allergy status to narcotic agent status: Secondary | ICD-10-CM | POA: Diagnosis not present

## 2022-10-14 DIAGNOSIS — I5022 Chronic systolic (congestive) heart failure: Secondary | ICD-10-CM | POA: Diagnosis not present

## 2022-10-14 DIAGNOSIS — I4892 Unspecified atrial flutter: Secondary | ICD-10-CM | POA: Diagnosis not present

## 2022-10-14 DIAGNOSIS — I2510 Atherosclerotic heart disease of native coronary artery without angina pectoris: Secondary | ICD-10-CM | POA: Diagnosis not present

## 2022-10-15 ENCOUNTER — Other Ambulatory Visit: Payer: Self-pay

## 2022-10-15 ENCOUNTER — Inpatient Hospital Stay (HOSPITAL_BASED_OUTPATIENT_CLINIC_OR_DEPARTMENT_OTHER): Payer: Medicare PPO | Admitting: Hospice and Palliative Medicine

## 2022-10-15 ENCOUNTER — Inpatient Hospital Stay: Payer: Medicare PPO

## 2022-10-15 ENCOUNTER — Emergency Department
Admission: EM | Admit: 2022-10-15 | Discharge: 2022-10-15 | Disposition: A | Discharge status: Home / Self Care | Payer: Medicare PPO | Attending: Emergency Medicine | Admitting: Emergency Medicine

## 2022-10-15 ENCOUNTER — Encounter: Payer: Self-pay | Admitting: Internal Medicine

## 2022-10-15 VITALS — BP 124/73 | HR 61 | Temp 97.6°F | Resp 20

## 2022-10-15 DIAGNOSIS — Z9221 Personal history of antineoplastic chemotherapy: Secondary | ICD-10-CM | POA: Diagnosis not present

## 2022-10-15 DIAGNOSIS — C9001 Multiple myeloma in remission: Secondary | ICD-10-CM | POA: Diagnosis not present

## 2022-10-15 DIAGNOSIS — Z23 Encounter for immunization: Secondary | ICD-10-CM | POA: Diagnosis not present

## 2022-10-15 DIAGNOSIS — W5501XA Bitten by cat, initial encounter: Secondary | ICD-10-CM

## 2022-10-15 DIAGNOSIS — N1832 Chronic kidney disease, stage 3b: Secondary | ICD-10-CM | POA: Diagnosis not present

## 2022-10-15 DIAGNOSIS — S81812A Laceration without foreign body, left lower leg, initial encounter: Secondary | ICD-10-CM | POA: Diagnosis not present

## 2022-10-15 DIAGNOSIS — Z203 Contact with and (suspected) exposure to rabies: Secondary | ICD-10-CM | POA: Diagnosis not present

## 2022-10-15 DIAGNOSIS — I129 Hypertensive chronic kidney disease with stage 1 through stage 4 chronic kidney disease, or unspecified chronic kidney disease: Secondary | ICD-10-CM | POA: Diagnosis not present

## 2022-10-15 DIAGNOSIS — C9000 Multiple myeloma not having achieved remission: Secondary | ICD-10-CM

## 2022-10-15 DIAGNOSIS — Z79899 Other long term (current) drug therapy: Secondary | ICD-10-CM | POA: Diagnosis not present

## 2022-10-15 DIAGNOSIS — S81852A Open bite, left lower leg, initial encounter: Secondary | ICD-10-CM | POA: Diagnosis not present

## 2022-10-15 DIAGNOSIS — I2510 Atherosclerotic heart disease of native coronary artery without angina pectoris: Secondary | ICD-10-CM | POA: Diagnosis not present

## 2022-10-15 LAB — MAGNESIUM: Magnesium: 1.3 mg/dL — ABNORMAL LOW (ref 1.7–2.4)

## 2022-10-15 MED ORDER — MAGNESIUM SULFATE 2 GM/50ML IV SOLN
2.0000 g | Freq: Once | INTRAVENOUS | Status: AC
  Administered 2022-10-15: 2 g via INTRAVENOUS
  Filled 2022-10-15: qty 50

## 2022-10-15 MED ORDER — MAGNESIUM SULFATE 4 GM/100ML IV SOLN
4.0000 g | Freq: Once | INTRAVENOUS | Status: AC
  Administered 2022-10-15: 4 g via INTRAVENOUS
  Filled 2022-10-15: qty 100

## 2022-10-15 MED ORDER — AMOXICILLIN-POT CLAVULANATE 875-125 MG PO TABS: 1.0000 | ORAL_TABLET | Freq: Two times a day (BID) | ORAL | 0 refills | Status: DC

## 2022-10-15 MED ORDER — SODIUM CHLORIDE 0.9% FLUSH
10.0000 mL | Freq: Once | INTRAVENOUS | Status: AC | PRN
  Administered 2022-10-15: 10 mL
  Filled 2022-10-15: qty 10

## 2022-10-15 MED ORDER — SODIUM CHLORIDE 0.9 % IV SOLN
25.0000 mg | Freq: Once | INTRAVENOUS | Status: AC
  Administered 2022-10-15: 25 mg via INTRAVENOUS
  Filled 2022-10-15: qty 1

## 2022-10-15 MED ORDER — RABIES IMMUNE GLOBULIN 150 UNIT/ML IM INJ
20.0000 [IU]/kg | INJECTION | Freq: Once | INTRAMUSCULAR | Status: AC
  Administered 2022-10-15: 1350 [IU] via INTRAMUSCULAR
  Filled 2022-10-15: qty 10

## 2022-10-15 MED ORDER — HEPARIN SOD (PORK) LOCK FLUSH 100 UNIT/ML IV SOLN
500.0000 [IU] | Freq: Once | INTRAVENOUS | Status: AC | PRN
  Administered 2022-10-15: 500 [IU]
  Filled 2022-10-15: qty 5

## 2022-10-15 MED ORDER — CLOTRIMAZOLE 3 2 % VA CREA: 1.0000 | TOPICAL_CREAM | Freq: Every day | VAGINAL | 0 refills | Status: DC

## 2022-10-15 MED ORDER — RABIES VACCINE, PCEC IM SUSR
1.0000 mL | Freq: Once | INTRAMUSCULAR | Status: AC
  Administered 2022-10-15: 1 mL via INTRAMUSCULAR
  Filled 2022-10-15: qty 1

## 2022-10-15 MED ORDER — SODIUM CHLORIDE 0.9 % IV SOLN
Freq: Once | INTRAVENOUS | Status: AC
  Filled 2022-10-15: qty 250

## 2022-10-15 NOTE — Discharge Instructions (Signed)
Start taking the Augmentin today as you were prescribed earlier.  You were given the first dose of the rabies vaccination. You must get a repeat immunoglobulin on days 3, 7, and 14. Today is considered day 0. You may go to the [place] or [place] [facility]gent care for this:  [facility] urgent Care at [facility], open Monday through Friday 8am-4pm with online scheduling. OR [facility] [address] [place]. Open Monday-Friday 8am-8pm, Saturday and Sunday 8am-4pm with online scheduling or walk in.  Return for any new, worsening, or change in symptoms or other concerns. It was a pleasure caring for you.

## 2022-10-15 NOTE — ED Provider Notes (Signed)
[facility] Provider Note    Event Date/Time   First MD Initiated Contact with Patient 10/15/22 1658     (approximate)   History   Animal Bite   HPI  Sarah [name] is a 67 y.o. female who presents today for evaluation of cat bite.  Patient reports that she was bit to her left lower leg 5 days ago.  She reports that she has been keeping the area clean.  She was just treated on an antibiotic today which she has not yet picked up but plans to pick up on her way home from the emergency department.  She reports that her tetanus is up-to-date.  She came to the emergency department, because she was told that she needed to have a tetanus vaccination.  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  She reports that the cat is currently pregnant and she attributed the bite to "pregnancy hormones."  She has not noticed any significant pain or swelling to the area.  No fevers or chills.  She is able to ambulate.     Physical Exam   Triage Vital Signs: ED Triage Vitals  Enc Vitals Group     BP 10/15/22 1340 125/67     Pulse Rate 10/15/22 1340 60     Resp 10/15/22 1340 18     Temp 10/15/22 1340 97.6 F (36.4 C)     Temp Source 10/15/22 1340 Oral     SpO2 10/15/22 1340 98 %     Weight --      Height --      Head Circumference --      Peak Flow --      Pain Score 10/15/22 1339 5     Pain Loc --      Pain Edu? --      Excl. in GC? --     Most recent vital signs: Vitals:   10/15/22 1340  BP: 125/67  Pulse: 60  Resp: 18  Temp: 97.6 F (36.4 C)  SpO2: 98%    Physical Exam Vitals and nursing note reviewed.  Constitutional:      General: Awake and alert. No acute distress.    Appearance: Normal appearance. The patient is normal weight.  HENT:     Head: Normocephalic and atraumatic.     Mouth: Mucous membranes are moist.  Eyes:     General: PERRL. Normal EOMs        Right eye: No discharge.        Left  eye: No discharge.     Conjunctiva/sclera: Conjunctivae normal.  Cardiovascular:     Rate and Rhythm: Normal rate and regular rhythm.     Pulses: Normal pulses.  Pulmonary:     Effort: Pulmonary effort is normal. No respiratory distress.     Breath sounds: Normal breath sounds.  Abdominal:     Abdomen is soft. There is no abdominal tenderness. No rebound or guarding. No distention. Musculoskeletal:        General: No swelling. Normal range of motion.     Cervical back: Normal range of motion and neck supple.  Left lower extremity: 1.5 cm laceration noted to the medial part of her left calf with very minimal erythema.  No fluctuance or induration.  No lymphangitis.  No purulence.  Posterior calf with very superficial scratches noted. Skin:    General: Skin is warm and dry.  Capillary Refill: Capillary refill takes less than 2 seconds.     Findings: No rash.  Neurological:     Mental Status: The patient is awake and alert.      ED Results / Procedures / Treatments   Labs (all labs ordered are listed, but only abnormal results are displayed) Labs Reviewed - No data to display   EKG     RADIOLOGY     PROCEDURES:  Critical Care performed:   Procedures   MEDICATIONS ORDERED IN ED: Medications  rabies immune globulin (HYPERRAB/KEDRAB) injection 1,350 Units (1,350 Units Intramuscular Given 10/15/22 1731)  rabies vaccine (RABAVERT) injection 1 mL (1 mL Intramuscular Given 10/15/22 1733)     IMPRESSION / MDM / ASSESSMENT AND PLAN / ED COURSE  I reviewed the triage vital signs and the nursing notes.   Differential diagnosis includes, but is not limited to, infection, rabies prophylaxis, cellulitis.  Patient is awake and alert, hemodynamically stable and afebrile.  There is minimal surrounding erythema, and injury occurred several days ago.  She is already up-to-date on her tetanus vaccination.  She was started on Augmentin today, which she reports that she is picking  up on her way home from the ER.  I impressed upon her the importance of starting this medication.  She understands and agrees.  She was given her tetanus vaccination and immunoglobulin while in the emergency department.  She understands the timeframe for getting her subsequent doses.  This was written down for her as well.  We discussed the importance of keeping the wound clean with soap and water multiple times per day.  Patient understands and agrees with plan.  She was discharged in stable condition.   Patient's presentation is most consistent with acute illness / injury with system symptoms.    FINAL CLINICAL IMPRESSION(S) / ED DIAGNOSES   Final diagnoses:  Cat bite, initial encounter     Rx / DC Orders   ED Discharge Orders     None        Note:  This document was prepared using Dragon voice recognition software and may include unintentional dictation errors.   Jackelyn Hoehn, PA-C 10/15/22 1753    Georga Hacking, MD 10/16/22 2029

## 2022-10-15 NOTE — Progress Notes (Signed)
Symptom Management Clinic [facility]  [phone] [phone]  Patient Care Team: [name], MD as PCP - General (Internal Medicine) Shiela Mayer, MD as Referring Physician (Pain Medicine) Lurene Shadow, MD as Consulting Physician (Cardiology) Rickard Patience, MD as Consulting Physician (Oncology)   Name of the patient: Sarah [name]  [id number]  [birth year]   Date of visit: 10/15/22  Reason for Consult:  Sarah [name] is a 67 year old woman with multiple medical problems including multiple myeloma status post bone marrow transplant.   Dara maintenance currently on hold due to history of frequent infections despite IVIG.  Interval History: Patient was here receiving IV fluids and requested to see a provider due to a recent cat bite.  Patient states that she was bitten and scratched by a feral cat last week on her left leg.  She denies swelling to the leg.  She denies drainage from the wounds.  She has been applying topical antibiotic cream.  There is residual pain.  She denies fever or chills.  Patient cannot recall when she received her last tetanus vaccine.   Denies any neurologic complaints.  Denies any easy bleeding or bruising.  Denies chest pain. Patient offers no further specific complaints today.  PAST MEDICAL HISTORY: Past Medical History:  Diagnosis Date   Anemia    Anxiety    Aortic stenosis    a. 05/2020 Echo: EF 45-50%, sev AS - seen by TAVR team @ [facility] - CTA sugg of tricuspid valve w/ fusing of 2 leaflets-TAVR deferred in setting of acute infxn; b. 07/2020 Echo: EF 50-55%, mod-sev AS; c. 12/2020 Echo: EF>55%. Mod-sev paradoxical low-flow low-gradient AS; d. 08/2021 Echo: EF 35-40%, severe AS, triv AI, mild MR.   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 Variable EF over time; b. 08/2017 Echo: EF  40%; b. 03/2020 Echo: EF 55-60%; c. 05/2020 Echo: EF 45-50%; d. 07/2020 Echo: EF 50-55%; e. 12/2020 Echo: EF>55%; f. 08/2021 Echo: EF 35-40%.   Chronic heart failure with preserved ejection fraction (HFpEF) (HCC)    a. Variable EF over time; b. 08/2017 Echo: EF 40%; b. 03/2020 Echo: EF 55-60%; c. 05/2020 Echo: EF 45-50%; d. 07/2020 Echo: EF 50-55%; e. 12/2020 Echo: EF>55%; f. 08/2021 Echo: EF 35-40%, no RV, mildly dil LA, mild MR, triv AI, severe AS.   CKD (chronic kidney disease) stage 3, GFR 30-59 ml/min (HCC)    Depression    Diabetes mellitus (HCC)    Dizziness    Fatty liver    Frequent falls    GERD (gastroesophageal reflux disease)    Gout    Heart murmur    History of blood transfusion    History of bone marrow transplant (HCC)    History of uterine fibroid    Hx of cardiac catheterization    a. 01/2016 Cath [facility] - after abnl nuc): Nl cors.   Hypertension    Hypomagnesemia    IDA (iron deficiency anemia)    Multiple myeloma (HCC)    Personal history of chemotherapy    PSVT (paroxysmal supraventricular tachycardia)    a. S/p RFCA [facility]).   PVC's (premature ventricular contractions)    a. Well-managed w/ bisoprolol in outpt setting.   Renal cyst     PAST SURGICAL HISTORY:  Past Surgical History:  Procedure Laterality Date   ABDOMINAL HYSTERECTOMY  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   CARDIAC ELECTROPHYSIOLOGY MAPPING AND ABLATION     CARPAL TUNNEL RELEASE Bilateral    CHOLECYSTECTOMY  2008   COLONOSCOPY WITH PROPOFOL N/A 05/07/2017   Procedure: COLONOSCOPY WITH PROPOFOL;  Surgeon: Wyline Mood, MD;  Location: [facility] ENDOSCOPY;  Service: Gastroenterology;  Laterality: N/A;   COLONOSCOPY WITH PROPOFOL N/A 12/19/2020   Procedure: COLONOSCOPY WITH PROPOFOL;  Surgeon: Wyline Mood, MD;  Location: [facility] ENDOSCOPY;  Service: Gastroenterology;  Laterality: N/A;   ESOPHAGOGASTRODUODENOSCOPY (EGD) WITH PROPOFOL N/A 05/07/2017   Procedure: ESOPHAGOGASTRODUODENOSCOPY (EGD) WITH  PROPOFOL;  Surgeon: Wyline Mood, MD;  Location: [facility] ENDOSCOPY;  Service: Gastroenterology;  Laterality: N/A;   FOOT SURGERY Bilateral    INCONTINENCE SURGERY  2009   INTERSTIM IMPLANT PLACEMENT     other     over active bladder   OTHER SURGICAL HISTORY     bladder stimulator    PARTIAL HYSTERECTOMY  03/1996   fibroids   PORTA CATH INSERTION N/A 03/10/2019   Procedure: PORTA CATH INSERTION;  Surgeon: Annice Needy, MD;  Location: [facility] INVASIVE CV LAB;  Service: Cardiovascular;  Laterality: N/A;   TONSILLECTOMY  2007    HEMATOLOGY/ONCOLOGY HISTORY:  Oncology History Overview Note  # June 2017- IgALamda MULTIPLE MYELOMA [BMBx- 80% plasma cells; [facility]]; Lamda light chain 1340; s/p RVD   # 14th DEC 2016- Auto-Stem cell transplant [[facility];Dr.Hertzig]; rev [dose reduced sec to Diarrhea]  # Maintenance Revlimid-  3 w-ON & 1 week OFF; HELD July 2018- sec to ONJ  # Bone lesions-numerous ~40mm lytic lesions in Thoracic spine- on Zometa  # 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  # "Colitis" s/p multiple EGD/Colonoscopies; EGD- patchy inflammation/colo- Nov 2018 [Dr.Anna]  DIAGNOSIS: Multiple myeloma  GOALS: Control/palliative  CURRENT/MOST RECENT THERAPY -surveillance    Multiple myeloma not having achieved remission  09/27/2019 - 04/10/2020 Chemotherapy         08/31/2020 - 05/14/2021 Chemotherapy   Patient is on Treatment Plan : MYELOMA MAINTENANCE Bortezomib SQ q14d     05/30/2021 - 05/30/2021 Chemotherapy   Patient is on Treatment Plan : MYELOMA NEWLY DIAGNOSED TRANSPLANT CANDIDATE Daratumumab SQ + Lenalidomide q28d (Maintenance)     05/30/2021 -  Chemotherapy   Patient is on Treatment Plan : MYELOMA NEWLY DIAGNOSED TRANSPLANT CANDIDATE Daratumumab SQ + Lenalidomide q28d (Maintenance)      06/06/2021 - 10/29/2021 Chemotherapy   Patient is on Treatment Plan : MYELOMA POST  TRANSPLANT Daratumumab SQ (Maintenance)     Multiple myeloma in remission  05/14/2021 Initial Diagnosis   Multiple myeloma in remission (HCC)   05/30/2021 - 05/30/2021 Chemotherapy   Patient is on Treatment Plan : MYELOMA NEWLY DIAGNOSED TRANSPLANT CANDIDATE Daratumumab SQ + Lenalidomide q28d (Maintenance)     05/30/2021 -  Chemotherapy   Patient is on Treatment Plan : MYELOMA NEWLY DIAGNOSED TRANSPLANT CANDIDATE Daratumumab SQ + Lenalidomide q28d (Maintenance)     06/06/2021 - 10/29/2021 Chemotherapy   Patient is on Treatment Plan : MYELOMA POST  TRANSPLANT Daratumumab SQ (Maintenance)       ALLERGIES:  is allergic to oxycodone-acetaminophen, celebrex [celecoxib], codeine, plerixafor, benadryl [diphenhydramine], morphine, ondansetron, and tylenol [acetaminophen].  MEDICATIONS:  Current Outpatient Medications  Medication Sig Dispense Refill   allopurinol (ZYLOPRIM) 100 MG tablet TAKE 1 TABLET(100 MG) BY MOUTH DAILY as needed  90 tablet 1   amiodarone (PACERONE) 200 MG tablet Take 200 mg by mouth daily. Take 2 tabs daily for 3 weeks and then 1 tab daily     atorvastatin (LIPITOR) 10 MG tablet Take 0.5 tablets (5 mg total) by mouth daily. 15 tablet 11   bisoprolol (ZEBETA) 10 MG tablet Take 1 tablet (10 mg total) by mouth daily. 90 tablet 1   calcium carbonate (OS-CAL) 1250 (500 Ca) MG chewable tablet Chew 2 tablets by mouth daily.     diclofenac sodium (VOLTAREN) 1 % GEL Apply 2 g topically 4 (four) times daily. 100 g 1   diphenoxylate-atropine (LOMOTIL) 2.5-0.025 MG tablet Take 2 tablets by mouth 4 (four) times daily as needed for diarrhea or loose stools. 180 tablet 3   empagliflozin (JARDIANCE) 10 MG TABS tablet Take 1 tablet by mouth daily.     ENTRESTO 24-26 MG Take 1 tablet by mouth 2 (two) times daily.     furosemide (LASIX) 40 MG tablet Take 0.5 tablets (20 mg total) by mouth daily. Home dose.      glipiZIDE (GLUCOTROL XL) 2.5 MG 24 hr tablet Take 1 tablet (2.5 mg total) by mouth daily with breakfast. 30 tablet 0   glucose blood test strip      lidocaine (XYLOCAINE) 2 % solution Use as directed 15 mLs in the mouth or throat every 3 (three) hours as needed for mouth pain (swish and spit). 100 mL 0   magnesium chloride (SLOW-MAG) 64 MG TBEC SR tablet Take 1 tablet (64 mg total) by mouth daily. 60 tablet 1   montelukast (SINGULAIR) 10 MG tablet Take 1 tablet (10 mg total) by mouth See admin instructions. Take 1 tab daily for 2 days after daratumumab treatments 30 tablet 1   Multiple Vitamin (MULTIVITAMIN) tablet Take 1 tablet by mouth daily.     omeprazole (PRILOSEC) 40 MG capsule TAKE 1 CAPSULE IN THE MORNING AND TAKE 1 CAPSULE AT BEDTIME 180 capsule 1   pentoxifylline (TRENTAL) 400 MG CR tablet Take 400 mg by mouth 3 (three) times daily with meals.     potassium chloride SA (KLOR-CON M) 20 MEQ tablet Take 1 tablet (20 mEq total) by mouth daily. Home med, reduced from twice daily to daily.  0   promethazine (PHENERGAN) 25 MG tablet Take 1 tablet (25 mg total) by mouth every 6 (six) hours as needed for nausea or vomiting. 90 tablet 1   promethazine-dextromethorphan (PROMETHAZINE-DM) 6.25-15 MG/5ML syrup Take 5 mLs by mouth 4 (four) times daily as needed for cough. 118 mL 0   spironolactone (ALDACTONE) 25 MG tablet Take by mouth.     tiZANidine (ZANAFLEX) 4 MG tablet Take 1 tablet (4 mg total) by mouth every 6 (six) hours as needed for muscle spasms. 90 tablet 0   traMADol (ULTRAM) 50 MG tablet      traZODone (DESYREL) 100 MG tablet TAKE 1 TABLET AT BEDTIME 90 tablet 3   venlafaxine (EFFEXOR) 75 MG tablet Take 75 mg by mouth daily.     vitamin E 45 MG (100 UNITS) capsule Take by mouth daily.     No current facility-administered medications for this visit.   Facility-Administered Medications Ordered in Other Visits  Medication Dose Route Frequency Provider Last Rate Last Admin   0.9 %   sodium chloride infusion   Intravenous Once Rickard Patience, MD       heparin lock flush 100 unit/mL  500 Units Intracatheter Once PRN Rickard Patience, MD  magnesium sulfate IVPB 2 g 50 mL  2 g Intravenous Once Rickard Patience, MD       magnesium sulfate IVPB 4 g 100 mL  4 g Intravenous Once Rickard Patience, MD       promethazine (PHENERGAN) 25 mg in sodium chloride 0.9 % 50 mL IVPB  25 mg Intravenous Once Rickard Patience, MD 200 mL/hr at 10/15/22 0935 25 mg at 10/15/22 0935   sodium chloride flush (NS) 0.9 % injection 10 mL  10 mL Intravenous PRN Rickard Patience, MD   10 mL at 04/02/21 8416    VITAL SIGNS: There were no vitals taken for this visit. There were no vitals filed for this visit.   Estimated body mass index is 26.53 kg/m as calculated from the following:   Height as of 09/25/22: 5' 4.5" (1.638 m).   Weight as of 09/25/22: 157 lb (71.2 kg).  LABS: CBC:    Component Value Date/Time   WBC 6.1 09/30/2022 0915   WBC 7.0 09/11/2022 1006   HGB 11.7 (L) 09/30/2022 0915   HCT 35.4 (L) 09/30/2022 0915   PLT 120 (L) 09/30/2022 0915   MCV 86.3 09/30/2022 0915   NEUTROABS 3.9 09/30/2022 0915   LYMPHSABS 1.4 09/30/2022 0915   MONOABS 0.5 09/30/2022 0915   EOSABS 0.2 09/30/2022 0915   BASOSABS 0.0 09/30/2022 0915   Comprehensive Metabolic Panel:    Component Value Date/Time   NA 136 09/30/2022 0915   NA 141 04/19/2016 1604   K 4.0 09/30/2022 0915   CL 106 09/30/2022 0915   CO2 23 09/30/2022 0915   BUN 22 09/30/2022 0915   BUN 22 04/19/2016 1604   CREATININE 1.28 (H) 09/30/2022 0915   GLUCOSE 107 (H) 09/30/2022 0915   CALCIUM 8.2 (L) 09/30/2022 0915   CALCIUM 8.8 03/23/2020 1144   AST 28 09/30/2022 0915   ALT 26 09/30/2022 0915   ALKPHOS 88 09/30/2022 0915   BILITOT 0.3 09/30/2022 0915   PROT 7.1 09/30/2022 0915   ALBUMIN 4.0 09/30/2022 0915    RADIOGRAPHIC STUDIES: No results found.  PERFORMANCE STATUS (ECOG) : 1 - Symptomatic but completely ambulatory  Review of Systems Unless otherwise noted, a  complete review of systems is negative.  Physical Exam General: NAD Cardiovascular: regular rate and rhythm Pulmonary: clear anterior/posterior fields Abdomen: soft, nontender, + bowel sounds GU: no suprapubic tenderness Extremities: no edema, no joint deformities Skin: See images Neurological: Nonfocal          Assessment and Plan:  Cat bite-wound does not appear consistent with cellulitis or abscess.  However, given that patient has history of recurrent infections with myeloma, we will start her on antibiotic prophylaxis with Augmentin.  Will start patient also on clotrimazole cream given history of vaginal candidiasis with antibiotics.  Case discussed with PCP and patient is updated on tetanus as of 2022.  Patient was advised to contact health department or emergency department to discuss rabies series as cat was feral and reportedly unavailable for quarantine.  Patient verbalized understanding and states that she recognizes the seriousness of potential rabies.  Case and plan discussed with Dr. Cathie Hoops and Dr. Judithann Graves.  Patient advised to follow-up with health department and animal control.  Patient expressed understanding and was in agreement with this plan. She also understands that She can call clinic at any time with any questions, concerns, or complaints.   Thank you for allowing me to participate in the care of this very pleasant patient.   Time Total: 20 minutes  Visit consisted of counseling and education dealing with the complex and emotionally intense issues of symptom management and palliative care in the setting of serious and potentially life-threatening illness.Greater than 50%  of this time was spent counseling and coordinating care related to the above assessment and plan.  Signed by: Altha Harm, PhD, NP-C

## 2022-10-15 NOTE — ED Triage Notes (Signed)
Pt to ED via POV from home. Pt reports cat bite to left leg. Cat is not vaccinated.

## 2022-10-17 ENCOUNTER — Encounter: Payer: Self-pay | Admitting: Family Medicine

## 2022-10-17 ENCOUNTER — Ambulatory Visit (INDEPENDENT_AMBULATORY_CARE_PROVIDER_SITE_OTHER): Payer: Medicare PPO | Admitting: Family Medicine

## 2022-10-17 VITALS — BP 126/76 | HR 68 | Ht 63.0 in | Wt 152.0 lb

## 2022-10-17 DIAGNOSIS — W5501XD Bitten by cat, subsequent encounter: Secondary | ICD-10-CM | POA: Insufficient documentation

## 2022-10-17 DIAGNOSIS — S91052D Open bite, left ankle, subsequent encounter: Secondary | ICD-10-CM | POA: Insufficient documentation

## 2022-10-17 MED ORDER — CLOTRIMAZOLE 3 2 % VA CREA: 1.0000 | TOPICAL_CREAM | Freq: Every day | VAGINAL | 0 refills | Status: DC

## 2022-10-17 NOTE — Patient Instructions (Signed)
-   Finish out Augmentin - Keep visits with Urgent Care for remaining rabies vaccination series - Use probiotic - Can use clotrimazole cream - Continue gentle wound care - Follow-up as-needed

## 2022-10-17 NOTE — Assessment & Plan Note (Signed)
Patient presents for follow-up to cat bite of left ankle.  [facility] ER HPI from 10/15/2022 below:  HPI  Sarah [name] is a 67 y.o. female who presents today for evaluation of cat bite.  Patient reports that she was bit to her left lower leg 5 days ago.  She reports that she has been keeping the area clean.  She was just treated on an antibiotic today which she has not yet picked up but plans to pick up on her way home from the emergency department.  She reports that her tetanus is up-to-date.  She came to the emergency department, because she was told that she needed to have a tetanus vaccination.  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  She reports that the cat is currently pregnant and she attributed the bite to "pregnancy hormones."  She has not noticed any significant pain or swelling to the area.  No fevers or chills.  She is able to ambulate.  While she was in the ER she received rabies immunoglobulin and initial rabies vaccine, is scheduled for second and third doses through [facility] urgent care.  She has been performing daily wound care and continues to dose Augmentin.  She is prone to yeast infections, was unable to pick up Rx for clotrimazole, has been asymptomatic in this regard.  Examination today reveals healing cat bite and scratches (3 locations) about the left lower third ankle, healing.  - Finish out Augmentin - Keep visits with Urgent Care for remaining rabies vaccination series - Use probiotic - Can use clotrimazole cream - Continue gentle wound care - Follow-up as-needed

## 2022-10-17 NOTE — Progress Notes (Signed)
     Primary Care / Sports Medicine Office Visit  Patient Information:  Patient ID: Sarah [name], female DOB: March 23, 1956 Age: 67 y.o. MRN: [id number]   Sarah [name] is a pleasant 67 y.o. female presenting with the following:  Chief Complaint  Patient presents with   Animal Bite    L) leg bite and scratches    Vitals:   10/17/22 1410  BP: 126/76  Pulse: 68  SpO2: 98%   Vitals:   10/17/22 1410  Weight: 152 lb (68.9 kg)  Height: 5\' 3"  (1.6 m)   Body mass index is 26.93 kg/m.  No results found.   Independent interpretation of notes and tests performed by another provider:   None  Procedures performed:   None  Pertinent History, Exam, Impression, and Recommendations:   Sarah [name] was seen today for animal bite.  Cat bite of ankle, left, subsequent encounter Assessment & Plan: Patient presents for follow-up to cat bite of left ankle.  [facility] ER HPI from 10/15/2022 below:  HPI  Sarah [name] is a 67 y.o. female who presents today for evaluation of cat bite.  Patient reports that she was bit to her left lower leg 5 days ago.  She reports that she has been keeping the area clean.  She was just treated on an antibiotic today which she has not yet picked up but plans to pick up on her way home from the emergency department.  She reports that her tetanus is up-to-date.  She came to the emergency department, because she was told that she needed to have a tetanus vaccination.  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  She reports that the cat is currently pregnant and she attributed the bite to "pregnancy hormones."  She has not noticed any significant pain or swelling to the area.  No fevers or chills.  She is able to ambulate.  While she was in the ER she received rabies immunoglobulin and initial rabies vaccine, is scheduled for second and third doses through [facility] urgent care.  She has been performing daily wound care  and continues to dose Augmentin.  She is prone to yeast infections, was unable to pick up Rx for clotrimazole, has been asymptomatic in this regard.  Examination today reveals healing cat bite and scratches (3 locations) about the left lower third ankle, healing.  - Finish out Augmentin - Keep visits with Urgent Care for remaining rabies vaccination series - Use probiotic - Can use clotrimazole cream - Continue gentle wound care - Follow-up as-needed    Other orders -     Clotrimazole 3; Place 1 Applicatorful vaginally at bedtime.  Dispense: 21 g; Refill: 0     Orders & Medications Meds ordered this encounter  Medications   clotrimazole (CLOTRIMAZOLE 3) 2 % vaginal cream    Sig: Place 1 Applicatorful vaginally at bedtime.    Dispense:  21 g    Refill:  0   No orders of the defined types were placed in this encounter.    Return if symptoms worsen or fail to improve.     Jerrol Banana, MD, [facility]   Primary Care Sports Medicine Primary Care and Sports Medicine at [facility]

## 2022-10-18 ENCOUNTER — Ambulatory Visit
Admission: RE | Admit: 2022-10-18 | Discharge: 2022-10-18 | Disposition: A | Discharge status: Home / Self Care | Payer: Medicare PPO | Source: Ambulatory Visit | Attending: Internal Medicine | Admitting: Internal Medicine

## 2022-10-18 DIAGNOSIS — Z23 Encounter for immunization: Secondary | ICD-10-CM

## 2022-10-18 DIAGNOSIS — Z203 Contact with and (suspected) exposure to rabies: Secondary | ICD-10-CM | POA: Diagnosis not present

## 2022-10-18 MED ORDER — RABIES VACCINE, PCEC IM SUSR
1.0000 mL | Freq: Once | INTRAMUSCULAR | Status: AC
  Administered 2022-10-18: 1 mL via INTRAMUSCULAR

## 2022-10-18 NOTE — ED Triage Notes (Signed)
Pt presents to UC for 2nd dose of rabies injection. Initial dose given at [facility] ED on 10/15/22.

## 2022-10-21 ENCOUNTER — Inpatient Hospital Stay (HOSPITAL_BASED_OUTPATIENT_CLINIC_OR_DEPARTMENT_OTHER): Payer: Medicare PPO | Admitting: Oncology

## 2022-10-21 ENCOUNTER — Telehealth: Payer: Self-pay

## 2022-10-21 ENCOUNTER — Inpatient Hospital Stay: Payer: Medicare PPO

## 2022-10-21 ENCOUNTER — Encounter: Payer: Self-pay | Admitting: Oncology

## 2022-10-21 DIAGNOSIS — Z7682 Awaiting organ transplant status: Secondary | ICD-10-CM | POA: Diagnosis not present

## 2022-10-21 DIAGNOSIS — N1832 Chronic kidney disease, stage 3b: Secondary | ICD-10-CM | POA: Diagnosis not present

## 2022-10-21 DIAGNOSIS — Z9221 Personal history of antineoplastic chemotherapy: Secondary | ICD-10-CM | POA: Diagnosis not present

## 2022-10-21 DIAGNOSIS — W5501XA Bitten by cat, initial encounter: Secondary | ICD-10-CM | POA: Diagnosis not present

## 2022-10-21 DIAGNOSIS — I129 Hypertensive chronic kidney disease with stage 1 through stage 4 chronic kidney disease, or unspecified chronic kidney disease: Secondary | ICD-10-CM | POA: Diagnosis not present

## 2022-10-21 DIAGNOSIS — I2510 Atherosclerotic heart disease of native coronary artery without angina pectoris: Secondary | ICD-10-CM | POA: Diagnosis not present

## 2022-10-21 DIAGNOSIS — C9001 Multiple myeloma in remission: Secondary | ICD-10-CM

## 2022-10-21 DIAGNOSIS — R197 Diarrhea, unspecified: Secondary | ICD-10-CM | POA: Diagnosis not present

## 2022-10-21 DIAGNOSIS — Z79899 Other long term (current) drug therapy: Secondary | ICD-10-CM | POA: Diagnosis not present

## 2022-10-21 DIAGNOSIS — S91052D Open bite, left ankle, subsequent encounter: Secondary | ICD-10-CM | POA: Diagnosis not present

## 2022-10-21 DIAGNOSIS — C9000 Multiple myeloma not having achieved remission: Secondary | ICD-10-CM

## 2022-10-21 DIAGNOSIS — W5501XD Bitten by cat, subsequent encounter: Secondary | ICD-10-CM | POA: Diagnosis not present

## 2022-10-21 DIAGNOSIS — G629 Polyneuropathy, unspecified: Secondary | ICD-10-CM | POA: Diagnosis not present

## 2022-10-21 LAB — CMP (CANCER CENTER ONLY)
ALT: 22 U/L (ref 0–44)
AST: 24 U/L (ref 15–41)
Albumin: 4.2 g/dL (ref 3.5–5.0)
Alkaline Phosphatase: 85 U/L (ref 38–126)
Anion gap: 9 (ref 5–15)
BUN: 34 mg/dL — ABNORMAL HIGH (ref 8–23)
CO2: 21 mmol/L — ABNORMAL LOW (ref 22–32)
Calcium: 8.4 mg/dL — ABNORMAL LOW (ref 8.9–10.3)
Chloride: 106 mmol/L (ref 98–111)
Creatinine: 1.35 mg/dL — ABNORMAL HIGH (ref 0.44–1.00)
GFR, Estimated: 43 mL/min — ABNORMAL LOW (ref >60)
Glucose, Bld: 133 mg/dL — ABNORMAL HIGH (ref 70–99)
Potassium: 3.9 mmol/L (ref 3.5–5.1)
Sodium: 136 mmol/L (ref 135–145)
Total Bilirubin: 0.3 mg/dL (ref 0.3–1.2)
Total Protein: 7.3 g/dL (ref 6.5–8.1)

## 2022-10-21 LAB — MAGNESIUM: Magnesium: 1.3 mg/dL — ABNORMAL LOW (ref 1.7–2.4)

## 2022-10-21 LAB — CBC WITH DIFFERENTIAL (CANCER CENTER ONLY)
Abs Immature Granulocytes: 0.02 10*3/uL (ref 0.00–0.07)
Basophils Absolute: 0 10*3/uL (ref 0.0–0.1)
Basophils Relative: 1 %
Eosinophils Absolute: 0.1 10*3/uL (ref 0.0–0.5)
Eosinophils Relative: 2 %
HCT: 37.8 % (ref 36.0–46.0)
Hemoglobin: 12.6 g/dL (ref 12.0–15.0)
Immature Granulocytes: 0 %
Lymphocytes Relative: 29 %
Lymphs Abs: 1.9 10*3/uL (ref 0.7–4.0)
MCH: 28.3 pg (ref 26.0–34.0)
MCHC: 33.3 g/dL (ref 30.0–36.0)
MCV: 84.8 fL (ref 80.0–100.0)
Monocytes Absolute: 0.4 10*3/uL (ref 0.1–1.0)
Monocytes Relative: 7 %
Neutro Abs: 4 10*3/uL (ref 1.7–7.7)
Neutrophils Relative %: 61 %
Platelet Count: 127 10*3/uL — ABNORMAL LOW (ref 150–400)
RBC: 4.46 MIL/uL (ref 3.87–5.11)
RDW: 14 % (ref 11.5–15.5)
WBC Count: 6.5 10*3/uL (ref 4.0–10.5)
nRBC: 0 % (ref 0.0–0.2)

## 2022-10-21 MED ORDER — HEPARIN SOD (PORK) LOCK FLUSH 100 UNIT/ML IV SOLN
500.0000 [IU] | Freq: Once | INTRAVENOUS | Status: AC
  Administered 2022-10-21: 500 [IU] via INTRAVENOUS
  Filled 2022-10-21: qty 5

## 2022-10-21 MED ORDER — CYANOCOBALAMIN 1000 MCG/ML IJ SOLN
1000.0000 ug | Freq: Once | INTRAMUSCULAR | Status: AC
  Administered 2022-10-21: 1000 ug via INTRAMUSCULAR
  Filled 2022-10-21: qty 1

## 2022-10-21 MED ORDER — SODIUM CHLORIDE 0.9 % IV SOLN
Freq: Once | INTRAVENOUS | Status: AC
  Filled 2022-10-21: qty 250

## 2022-10-21 MED ORDER — SODIUM CHLORIDE 0.9% FLUSH
10.0000 mL | Freq: Once | INTRAVENOUS | Status: AC
  Administered 2022-10-21: 10 mL via INTRAVENOUS
  Filled 2022-10-21: qty 10

## 2022-10-21 MED ORDER — MAGNESIUM SULFATE 2 GM/50ML IV SOLN
2.0000 g | Freq: Once | INTRAVENOUS | Status: AC
  Administered 2022-10-21: 2 g via INTRAVENOUS
  Filled 2022-10-21: qty 50

## 2022-10-21 MED ORDER — MAGNESIUM SULFATE 4 GM/100ML IV SOLN
4.0000 g | Freq: Once | INTRAVENOUS | Status: AC
  Administered 2022-10-21: 4 g via INTRAVENOUS
  Filled 2022-10-21: qty 100

## 2022-10-21 MED ORDER — SODIUM CHLORIDE 0.9 % IV SOLN
25.0000 mg | Freq: Once | INTRAVENOUS | Status: AC
  Administered 2022-10-21: 25 mg via INTRAVENOUS
  Filled 2022-10-21: qty 1

## 2022-10-21 NOTE — Assessment & Plan Note (Signed)
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   Previously on Lyrica and nortriptyline-not effective Neurology recommend Tramadol and may consider higher dose of  Nortriptyline in the future. Follows up with neurology. 

## 2022-10-21 NOTE — Progress Notes (Signed)
Pt here for for follow up. Reports that she had a cat bite and is currently on antibiotics and a round of rabies shots.

## 2022-10-21 NOTE — Progress Notes (Signed)
Hematology/Oncology Progress note [phone] [phone]     Chief Complaint: Sarah [name] is a 67 y.o. 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  Light chain ratio is normal. 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 Repeat immunofixation negative. . Labs reviewed and discussed with patient, stable M protein and stable light chain ratio Today's level is pending.   Daratumumab on hold due to frequent infections despite IVIG Continue acyclovir prophylaxis.    Hypomagnesemia #Chronic hypomagnesemia proceed with  IV magnesium today.  Continue weekly magnesium +/- IV magnesium.     Stem cell transplant candidate -[facility]- Post transplant RN coordinator [phone]).  Diarrhea chronic diarrhea,antidiarrhea PRN.  Recommend patient to follow-up with gastroenterology.   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   Previously on Lyrica and nortriptyline-not effective Neurology recommend Tramadol and may consider higher dose of  Nortriptyline in the future. Follows up with neurology.  Cat bite of ankle, left, subsequent encounter Cellulitis improved.  Continue Augmentin and finish the course.  She has been getting Rabies immunization.     Orders Placed This Encounter  Procedures   Magnesium    Standing Status:   Standing    Number of Occurrences:   5    Standing Expiration Date:   10/21/2023   CBC with Differential (Cancer Center Only)    Standing Status:   Future    Standing Expiration Date:   10/21/2023    CMP (Cancer Center only)    Standing Status:   Future    Standing Expiration Date:   10/21/2023   Kappa/lambda light chains    Standing Status:   Future    Standing Expiration Date:   10/21/2023   Multiple Myeloma Panel (SPEP&IFE w/QIG)    Standing Status:   Future    Standing Expiration Date:   10/21/2023   Magnesium    Standing Status:   Future    Standing Expiration Date:   10/21/2023    Follow up  Lab Mag weekly x5  + IV mag.  6 weeks flex lab MD IV mag,  same labs  All questions were answered. The patient knows to call the clinic with any problems, questions or concerns.  Rickard Patience, MD, PhD [name] 10/21/2022       PERTINENT ONCOLOGY HISTORY Sarah [name] is a 67 y.o.afemale who has above oncology history reviewed by me today presented for follow up visit for management of multiple myeloma Patient previously followed up by Dr.Corcoran, patient switched care to me on 11/23/20 Extensive medical record review was performed by me  stage III IgA lambda light chain multiple myeloma s/p autologous stem cell transplant on 06/14/2015 at the [facility] [place] and second autologous stem cell transplant on 05/10/2020 at [facility].   Initial bone marrow revealed 80% plasma cells.  Lambda free light chains were 1340.  She had nephrotic range proteinuria.  She initially underwent induction with RVD.  Revlimid maintenance was discontinued on 01/21/2017 secondary to intolerance.     07/19/2019 Bone marrow aspirate and biopsy on  revealed a normocellular marrow with but increased lambda-restricted plasma cells (9%  aspirate, 40% CD138 immunohistochemistry).  Findings were consistent with recurrent plasma cell myeloma.  Flow cytometry revealed no monoclonal B-cell or phenotypically aberrant T-cell population. Cytogenetics were 2, XX (normal).  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    Lambda light chains have been followed: 22.2 (ratio 0.56) on 07/03/2017,  30.8 (ratio 0.78) on 09/02/2017, 36.9 (ratio 0.40) on 10/21/2017, 37.4 (ratio 0.41) on 12/16/2017, 70.7(ratio 0.31)  on 02/17/2018, 64.2 (ratio 0.27) on 04/07/2018, 78.9 (ratio 0.18) on 05/26/2018, 128.8 (ratio 0.17) on 08/06/2018, 181.5 (ratio 0.13) on 10/08/2018, 130.9 (ratio 0.13) on 10/20/2018, 160.7 (ratio 0.10) on 12/09/2018, 236.6 (ratio 0.07) on 02/01/2019, 363.6 (ratio 0.04) on 03/22/2019, 404.8 (ratio 0.04) on 04/05/2019, 420.7 (ratio 0.03) on 05/24/2019, 573.4 (ratio 0.03) on 06/23/2019, 451.05 (ratio 0.02) on 08/20/2019, 47.2 (ratio 0.13) on 10/25/2019, 22.4 (ratio 0.21) on 11/25/2019, 16.5 (ratio 0.33) on 01/10/2020, 14.6 (ratio 0.34) on 02/07/2020, 13.1 (ratio 0.31) on 03/07/2020, 10.1 (ratio 0.38) on 04/10/2020, and 9.5 (ratio 0.21) on 06/19/2020.   24 hour UPEP on 06/03/2019 revealed kappa free light chains 95.76, lambda free light chains 1,260.71, and ratio 0.08.  24 hour UPEP on 08/23/2019 revealed total protein of 782 mg/24 hrs with lambda free light chains 1,084.16 mg/L and ratio of 0.10 (1.03-31.76).  M spike in urine was 46.1% (361 mg/24 hrs).    Bone survey on 04/08/2016 and 05/28/2017 revealed no definite lytic lesion seen in the visualized skeleton.  Bone survey on 11/19/2018 revealed no suspicious lucent lesions and no acute bony abnormality.  PET scan on 07/12/2019 revealed no focal metabolic activity to suggest active myeloma within the skeleton. There were no lytic lesions identified on the CT portion of the exam or soft tissue plasmacytomas. There was no evidence of multiple myeloma.    Pretreatment RBC phenotype on 09/23/2019 was positive for C, e, DUFFY B, KIDD B, M, S, and s antigen; negative for c, E, KELL, DUFFY A, KIDD A, and N antigen.    09/27/2019 - 10/25/2019; 12/09/2019 - 03/13/2020 6 cycles of daratumumab and hyaluronidase-fihj, Pomalyst, and Decadron (DPd) .  Cycle #1 was complicated by fever and neutropenia requiring admission.  Cycle #2 was complicated by  pneumonia requiring admission.  Cycle #6 was complicated with an ER evaluation for an elevated lactic acid.   05/10/2020 second autologous stem cell transplant at [facility]   She underwent conditioning with melphalan 140 mg/m2.    She is s/p week #3 Velcade maintenance (began 08/31/2020 - 09/28/2020).  She has no increase in neuropathy.  She has severe aortic stenosis.  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 Cardiology is following for a possible TAVR in the future.  Echo on 07/05/2020 revealed moderate to severe aortic stenosis with an EF of 50-55%.  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 Zometa was discontinued in 01/2017.  She has chronic nausea on Phenergan.     B12 deficiency.  B12 was 254 on 04/09/2017, 295 on 08/20/2018, and 391 on 10/08/2018.  She was on oral B12.  She received B12 monthly (last 09/14/2020).  Folate was 12.1 on 01/10/2020.    She has iron deficiency.  Ferritin was 32 on 07/01/2019.  She received Venofer on 07/15/2019 and 07/22/2019.   She has hypogammaglobulinemia.  IgG was 245 on 11/25/2019.  She received monthly IVIG (07/22/021 - 03/22/2020).  She received IVIG 400 mg/kg on 01/20/2020, 200 mg/kg on 02/17/2020, and 300  mg/kg on 03/22/2020.  IVIG on 01/20/2020 was complicated by acute renal failure. IgG trough level was 418 on 02/17/2020.  10/15/2019 - 10/20/2019 She was admitted to [facility] with fever and neutropenia.  Cultures were negative.  CXR was negative. She received broad spectrum antibiotics and daily Granix.  She received IVF for acute renal insufficiency due to diarrhea and dehydration.  Creatine was 1.66 on admission and 1.12 on discharge.    03/01/2020 - 03/05/2020 admitted to [facility] from with fever and neutropenia. CXR revealed no active cardiopulmonary disease. Chest CT with contrast revealed no acute intrathoracic pathology. There were findings which could be  suggestive of prior granulomatous disease. She was treated with Cefepime and Vancomycin, then switched to ciprofloxacin on 03/03/2020.   05/08/2020 - 12/05/2021The patient was admitted to [facility] from  for autologous bone marrow transplant.   She received conditioning with melphalan 140 mg/m2.  Bone marrow transplant was on 05/10/2020. The patient developed fevers daily from 05/19/2020 - 05/24/2020. Blood cultures were + for strept sanguis bacteremia.  She was treated cefepime, vancomycin, and solumedrol for possible engraftment syndrome. Cefepime was switched to ceftriaxone; she completed 2 weeks of ceftriaxone on 06/03/2020. She had chemotherapy induced diarrhea.  She experienced urinary retention.  Feb 2022 patient has been on maintenance Velcade 2 mg every 2 weeks.  Chronic diarrhea seen by gastroenterology Dr. Tobi Bastos and was recommended to avoid artificial sugars in her diet including Diet Coke and coffee mate.  She feels some improvement of her diarrhea frequency since the dietary modification.  GI profile PCR negative, C. difficile toxin A+ B negative, fecal calprotectin 77 12/19/2020 colonoscopy showed 2 subcentimeter polyps in the ascending colon, resected and removed.  Pathology showed tubular adenoma.  No malignancy.  Normal mucosa in the entire examined colon.  Biopsied, pathology showed colonic mucosa with no significant pathological alteration.  Negative for active inflammation and features of chronicity.  Negative for microscopic colitis, dysplasia, and malignanc  Diabetes, patient has discontinued metformin due to diarrhea and started on glipizide 2.5 mg   03/05/2021-03/09/2021 Patient was hospitalized due to fever and chills, nonproductive cough, shortness of breath. X-ray showed right lower lobe atelectasis versus pneumonia.  Blood cultures negative.  Urine culture showed 60,000 colonies of Enterococcus facialis.  Patient received antibiotics. Patient also had abdominal pelvis CT scan for  work-up of intermittent lower abdomen pain.No acute abnormality.   05/14/21 last dose of Velcade maintenance.  establish care with neurology Dr. Theora Master and her neuropathy regimen has been switched from gabapentin to Lyrica. 05/29/2021 patient got influenza   neuropathy medication has been switched from gabapentin/Lyrica to nortriptyline.  08/18/2021 Hospitalized due to pneumonia from metapnuemovirus, hospitalization was complicated with NSTEMI. She received heparin gtt.  Treated with antibiotics for coverage of pneumonia,  diuretics for pulmonary edema. She received IVIG 400mg /kg x 1 for hypoglobulinemia. Discharged on 08/25/21 with Augmentin and Zithromax.   May 2023, Daratumumab being held for [facility] infectious disease physician Dr. Kennedy Bucker due to frequent infection.  Diagnosed with CHF [HFrEF] -NYHA class II-III, she follows up with cardiology, started on entresto  hospitalization due to CHF exacerbation, community acquired pneumonia.   Presented to emergency room on 07/01/2022 Respiratory panel RT-PCR negative for COVID-19, influenza and RSV.   INTERVAL HISTORY Sarah [name] is a 67 y.o. female who has above history reviewed by me today presents for follow up visit for management of multiple myeloma.  Patient has been on weekly magnesium infusion as needed hypomagnesemia + CHF [nonischemic cardiomyopathy], aortic stenosis, follows  up with cardiology. + street cat bite, currently on Augmentin for cellulitis. Also is on a course of Rabies immunization.    Past Medical History:  Diagnosis Date   Anemia    Anxiety    Aortic stenosis    a. 05/2020 Echo: EF 45-50%, sev AS - seen by TAVR team @ [facility] - CTA sugg of tricuspid valve w/ fusing of 2 leaflets-TAVR deferred in setting of acute infxn; b. 07/2020 Echo: EF 50-55%, mod-sev AS; c. 12/2020 Echo: EF>55%. Mod-sev paradoxical low-flow low-gradient AS; d. 08/2021 Echo: EF 35-40%, severe AS, triv AI, mild MR.   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 Variable EF over time; b. 08/2017 Echo: EF 40%; b. 03/2020 Echo: EF 55-60%; c. 05/2020 Echo: EF 45-50%; d. 07/2020 Echo: EF 50-55%; e. 12/2020 Echo: EF>55%; f. 08/2021 Echo: EF 35-40%.   Chronic heart failure with preserved ejection fraction (HFpEF)    a. Variable EF over time; b. 08/2017 Echo: EF 40%; b. 03/2020 Echo: EF 55-60%; c. 05/2020 Echo: EF 45-50%; d. 07/2020 Echo: EF 50-55%; e. 12/2020 Echo: EF>55%; f. 08/2021 Echo: EF 35-40%, no RV, mildly dil LA, mild MR, triv AI, severe AS.   CKD (chronic kidney disease) stage 3, GFR 30-59 ml/min    Depression    Diabetes mellitus    Dizziness    Fatty liver    Frequent falls    GERD (gastroesophageal reflux disease)    Gout    Heart murmur    History of blood transfusion    History of bone marrow transplant    History of uterine fibroid    Hx of cardiac catheterization    a. 01/2016 [facility] - after abnl nuc): Nl cors.   Hypertension    Hypomagnesemia    IDA (iron deficiency anemia)    Multiple myeloma    Personal history of chemotherapy    PSVT (paroxysmal supraventricular tachycardia)    a. S/p RFCA [facility]).   PVC's (premature ventricular contractions)    a. Well-managed w/ bisoprolol in outpt setting.   Renal cyst     Past Surgical History:  Procedure Laterality Date   ABDOMINAL HYSTERECTOMY     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   CARDIAC ELECTROPHYSIOLOGY MAPPING AND ABLATION     CARPAL TUNNEL RELEASE Bilateral    CHOLECYSTECTOMY  2008   COLONOSCOPY WITH PROPOFOL N/A 05/07/2017   Procedure: COLONOSCOPY WITH PROPOFOL;  Surgeon: Wyline Mood, MD;  Location: [facility] ENDOSCOPY;  Service: Gastroenterology;  Laterality: N/A;   COLONOSCOPY WITH PROPOFOL N/A 12/19/2020   Procedure: COLONOSCOPY WITH PROPOFOL;  Surgeon: Wyline Mood, MD;  Location: [facility] ENDOSCOPY;  Service: Gastroenterology;  Laterality: N/A;    ESOPHAGOGASTRODUODENOSCOPY (EGD) WITH PROPOFOL N/A 05/07/2017   Procedure: ESOPHAGOGASTRODUODENOSCOPY (EGD) WITH PROPOFOL;  Surgeon: Wyline Mood, MD;  Location: [facility] ENDOSCOPY;  Service: Gastroenterology;  Laterality: N/A;   FOOT SURGERY Bilateral    INCONTINENCE SURGERY  2009   INTERSTIM IMPLANT PLACEMENT     other     over active bladder   OTHER SURGICAL HISTORY     bladder stimulator    PARTIAL HYSTERECTOMY  03/1996   fibroids   PORTA CATH INSERTION N/A 03/10/2019   Procedure: PORTA CATH INSERTION;  Surgeon: Annice Needy, MD;  Location: [facility] INVASIVE CV LAB;  Service: Cardiovascular;  Laterality: N/A;   TONSILLECTOMY  2007  Family History  Problem Relation Age of Onset   Colon cancer Father    Renal Disease Father    Diabetes Mellitus II Father    Melanoma Paternal Grandmother    Breast cancer Maternal Aunt 8   Anemia Mother    Heart disease Mother    Heart failure Mother    Renal Disease Mother    Congestive Heart Failure Mother    Heart disease Maternal Uncle    Throat cancer Maternal Uncle    Lung cancer Maternal Uncle    Liver disease Maternal Uncle    Heart failure Maternal Uncle    Hearing loss Son 66       Suicide     Social History:  reports that she quit smoking about 31 years ago. Her smoking use included cigarettes. She has a 20.00 pack-year smoking history. She has never used smokeless tobacco. She reports current alcohol use of about 2.0 standard drinks of alcohol per week. She reports that she does not use drugs.  She is on disability. She notes exposure to perchloroethylene [facility]).   Allergies:  Allergies  Allergen Reactions   Oxycodone-Acetaminophen Anaphylaxis    Swelling and rash   Celebrex [Celecoxib] Diarrhea   Codeine    Plerixafor     In 2016 during ASCT collection patient developed fever to 103.51F and required hospitalization   Benadryl [Diphenhydramine] Palpitations   Morphine Itching and Rash   Ondansetron Diarrhea   Tylenol  [Acetaminophen] Itching and Rash    Current Medications: Current Outpatient Medications  Medication Sig Dispense Refill   allopurinol (ZYLOPRIM) 100 MG tablet TAKE 1 TABLET(100 MG) BY MOUTH DAILY as needed 90 tablet 1   amiodarone (PACERONE) 200 MG tablet Take 200 mg by mouth daily. Take 2 tabs daily for 3 weeks and then 1 tab daily     amoxicillin-clavulanate (AUGMENTIN) 875-125 MG tablet Take 1 tablet by mouth 2 (two) times daily. 14 tablet 0   atorvastatin (LIPITOR) 10 MG tablet Take 0.5 tablets (5 mg total) by mouth daily. 15 tablet 11   bisoprolol (ZEBETA) 10 MG tablet Take 1 tablet (10 mg total) by mouth daily. 90 tablet 1   calcium carbonate (OS-CAL) 1250 (500 Ca) MG chewable tablet Chew 2 tablets by mouth daily.     clotrimazole (CLOTRIMAZOLE 3) 2 % vaginal cream Place 1 Applicatorful vaginally at bedtime. 21 g 0   diclofenac sodium (VOLTAREN) 1 % GEL Apply 2 g topically 4 (four) times daily. 100 g 1   diphenoxylate-atropine (LOMOTIL) 2.5-0.025 MG tablet Take 2 tablets by mouth 4 (four) times daily as needed for diarrhea or loose stools. 180 tablet 3   empagliflozin (JARDIANCE) 10 MG TABS tablet Take 1 tablet by mouth daily.     ENTRESTO 24-26 MG Take 1 tablet by mouth 2 (two) times daily.     furosemide (LASIX) 40 MG tablet Take 0.5 tablets (20 mg total) by mouth daily. Home dose.     glipiZIDE (GLUCOTROL XL) 2.5 MG 24 hr tablet Take 1 tablet (2.5 mg total) by mouth daily with breakfast. 30 tablet 0   glucose blood test strip      lidocaine (XYLOCAINE) 2 % solution Use as directed 15 mLs in the mouth or throat every 3 (three) hours as needed for mouth pain (swish and spit). 100 mL 0   magnesium chloride (SLOW-MAG) 64 MG TBEC SR tablet Take 1 tablet (64 mg total) by mouth daily. 60 tablet 1   Multiple Vitamin (MULTIVITAMIN) tablet Take 1  tablet by mouth daily.     omeprazole (PRILOSEC) 40 MG capsule TAKE 1 CAPSULE IN THE MORNING AND TAKE 1 CAPSULE AT BEDTIME 180 capsule 1    pentoxifylline (TRENTAL) 400 MG CR tablet Take 400 mg by mouth 3 (three) times daily with meals.     promethazine (PHENERGAN) 25 MG tablet Take 1 tablet (25 mg total) by mouth every 6 (six) hours as needed for nausea or vomiting. 90 tablet 1   spironolactone (ALDACTONE) 25 MG tablet Take by mouth.     tiZANidine (ZANAFLEX) 4 MG tablet Take 1 tablet (4 mg total) by mouth every 6 (six) hours as needed for muscle spasms. 90 tablet 0   traMADol (ULTRAM) 50 MG tablet      traZODone (DESYREL) 100 MG tablet TAKE 1 TABLET AT BEDTIME 90 tablet 3   venlafaxine (EFFEXOR) 75 MG tablet Take 75 mg by mouth daily.     vitamin E 45 MG (100 UNITS) capsule Take by mouth daily.     montelukast (SINGULAIR) 10 MG tablet Take 1 tablet (10 mg total) by mouth See admin instructions. Take 1 tab daily for 2 days after daratumumab treatments (Patient not taking: Reported on 10/21/2022) 30 tablet 1   potassium chloride SA (KLOR-CON M) 20 MEQ tablet Take 1 tablet (20 mEq total) by mouth daily. Home med, reduced from twice daily to daily. (Patient not taking: Reported on 10/21/2022)  0   promethazine-dextromethorphan (PROMETHAZINE-DM) 6.25-15 MG/5ML syrup Take 5 mLs by mouth 4 (four) times daily as needed for cough. (Patient not taking: Reported on 10/17/2022) 118 mL 0   No current facility-administered medications for this visit.   Facility-Administered Medications Ordered in Other Visits  Medication Dose Route Frequency Provider Last Rate Last Admin   heparin lock flush 100 unit/mL  500 Units Intravenous Once Rickard Patience, MD       magnesium sulfate IVPB 4 g 100 mL  4 g Intravenous Once Rickard Patience, MD 50 mL/hr at 10/21/22 1156 4 g at 10/21/22 1156   sodium chloride flush (NS) 0.9 % injection 10 mL  10 mL Intravenous PRN Rickard Patience, MD   10 mL at 04/02/21 1610    Review of Systems  Constitutional:  Negative for chills, fever, malaise/fatigue and weight loss.  HENT:  Negative for sore throat.   Eyes:  Negative for redness.   Respiratory:  Negative for cough, shortness of breath and wheezing.   Cardiovascular:  Negative for chest pain, palpitations and leg swelling.  Gastrointestinal:  Positive for diarrhea. Negative for abdominal pain, blood in stool, nausea and vomiting.  Genitourinary:  Negative for dysuria.  Musculoskeletal:  Positive for back pain and joint pain. Negative for myalgias.  Skin:  Negative for rash.  Neurological:  Positive for tingling and sensory change. Negative for dizziness and tremors.  Endo/Heme/Allergies:  Does not bruise/bleed easily.  Psychiatric/Behavioral:  Negative for hallucinations.     Performance status (ECOG): 1  Vitals Blood pressure 122/68, pulse (!) 58, temperature (!) 97.5 F (36.4 C), resp. rate 18, weight 154 lb 12.8 oz (70.2 kg).  Physical Exam Constitutional:      General: She is not in acute distress.    Appearance: She is not diaphoretic.  HENT:     Head: Normocephalic and atraumatic.  Eyes:     General: No scleral icterus.    Pupils: Pupils are equal, round, and reactive to light.  Cardiovascular:     Rate and Rhythm: Normal rate.     Heart sounds:  Murmur heard.  Pulmonary:     Effort: Pulmonary effort is normal. No respiratory distress.     Breath sounds: No rales.  Chest:     Chest wall: No tenderness.  Abdominal:     General: There is no distension.  Musculoskeletal:        General: Normal range of motion.     Cervical back: Normal range of motion and neck supple.  Skin:    General: Skin is warm and dry.     Findings: No erythema.     Comments: Lower extremity cat bite cellulitis has improved.   Neurological:     Mental Status: She is alert and oriented to person, place, and time.     Cranial Nerves: No cranial nerve deficit.     Motor: No abnormal muscle tone.     Coordination: Coordination normal.  Psychiatric:        Mood and Affect: Mood and affect normal.      Laboratory data    Latest Ref Rng & Units 10/21/2022    9:29 AM  09/30/2022    9:15 AM 09/11/2022   10:06 AM  CBC  WBC 4.0 - 10.5 K/uL 6.5  6.1  7.0   Hemoglobin 12.0 - 15.0 g/dL 95.6  21.3  08.6   Hematocrit 36.0 - 46.0 % 37.8  35.4  36.3   Platelets 150 - 400 K/uL 127  120  119       Latest Ref Rng & Units 10/21/2022    9:29 AM 09/30/2022    9:15 AM 09/11/2022   10:06 AM  CMP  Glucose 70 - 99 mg/dL 578  469  629   BUN 8 - 23 mg/dL 34  22  33   Creatinine 0.44 - 1.00 mg/dL 5.28  4.13  2.44   Sodium 135 - 145 mmol/L 136  136  135   Potassium 3.5 - 5.1 mmol/L 3.9  4.0  4.0   Chloride 98 - 111 mmol/L 106  106  107   CO2 22 - 32 mmol/L 21  23  20    Calcium 8.9 - 10.3 mg/dL 8.4  8.2  8.8   Total Protein 6.5 - 8.1 g/dL 7.3  7.1  7.4   Total Bilirubin 0.3 - 1.2 mg/dL 0.3  0.3  0.5   Alkaline Phos 38 - 126 U/L 85  88  94   AST 15 - 41 U/L 24  28  27    ALT 0 - 44 U/L 22  26  25

## 2022-10-21 NOTE — Telephone Encounter (Signed)
     Patient  visit on 10/15/2022  at [facility] was for animal bite.  Have you been able to follow up with your primary care physician? Yes  The patient was or was not able to obtain any needed medicine or equipment. Patient was able to obtain medication.  Are there diet recommendations that you are having difficulty following? No  Patient expresses understanding of discharge instructions and education provided has no other needs at this time. Yes   Clayton Bosserman [facility]  [facility] Population Health Community Resource Care Guide   [email or web address]  ?? [phone]   Website: [email or web address]  [email or web address]

## 2022-10-21 NOTE — Assessment & Plan Note (Signed)
-  [facility]- Post transplant RN coordinator ([phone]). 

## 2022-10-21 NOTE — Assessment & Plan Note (Signed)
#  Chronic hypomagnesemia proceed with  IV magnesium today.  Continue weekly magnesium +/- IV magnesium.    

## 2022-10-21 NOTE — Assessment & Plan Note (Signed)
Cellulitis improved.  Continue Augmentin and finish the course.  She has been getting Rabies immunization.

## 2022-10-21 NOTE — Telephone Encounter (Signed)
        Patient  visited [facility] on 10/15/2022  for Animal Bite.   Telephone encounter attempt :  1st  A HIPAA compliant voice message was left requesting a return call.  Instructed patient to call back at [phone].   [name]  [facility] Population Health Community Resource Care Guide   [name]  ?? [phone]   Website: [email or web address]  [email or web address]

## 2022-10-21 NOTE — Assessment & Plan Note (Signed)
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  Light chain ratio is normal. 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 Repeat immunofixation negative. . Labs reviewed and discussed with patient, stable M protein and stable light chain ratio Today's level is pending.   Daratumumab on hold due to frequent infections despite IVIG Continue acyclovir prophylaxis.

## 2022-10-21 NOTE — Assessment & Plan Note (Signed)
chronic diarrhea,antidiarrhea PRN.  Recommend patient to follow-up with gastroenterology.  

## 2022-10-22 ENCOUNTER — Ambulatory Visit
Admission: RE | Admit: 2022-10-22 | Discharge: 2022-10-22 | Disposition: A | Discharge status: Home / Self Care | Payer: Medicare PPO | Source: Ambulatory Visit | Attending: Internal Medicine | Admitting: Internal Medicine

## 2022-10-22 DIAGNOSIS — C9000 Multiple myeloma not having achieved remission: Secondary | ICD-10-CM

## 2022-10-22 DIAGNOSIS — Z23 Encounter for immunization: Secondary | ICD-10-CM

## 2022-10-22 DIAGNOSIS — Z203 Contact with and (suspected) exposure to rabies: Secondary | ICD-10-CM

## 2022-10-22 LAB — KAPPA/LAMBDA LIGHT CHAINS
Kappa free light chain: 28 mg/L — ABNORMAL HIGH (ref 3.3–19.4)
Kappa, lambda light chain ratio: 1.07 (ref 0.26–1.65)
Lambda free light chains: 26.1 mg/L (ref 5.7–26.3)

## 2022-10-22 MED ORDER — RABIES VACCINE, PCEC IM SUSR
1.0000 mL | Freq: Once | INTRAMUSCULAR | Status: AC
  Administered 2022-10-22: 1 mL via INTRAMUSCULAR

## 2022-10-22 NOTE — ED Triage Notes (Signed)
Pt presents for 3rd dose of rabies vaccine.  

## 2022-10-22 NOTE — Progress Notes (Signed)
CARE Daily Session Note  Patient Details  Name: Sarah [name] MRN: [id number] Date of Birth: Mar 29, 1956 Referring Provider:   Flowsheet Row Cardiac Rehab from 08/29/2022 in [facility] Cardiac and Pulmonary Rehab  Referring Provider Joneen Roach, MD       Encounter Date: 10/22/2022  Check In:  Session Check In - 10/22/22 1228       Check-In   Supervising physician immediately available to respond to emergencies See telemetry face sheet for immediately available ER MD    Location [facility]-Cardiac & Pulmonary Rehab    Staff Present Ronette Deter, BS, Exercise Physiologist;Jessica Westwood, MA, RCEP, CCRP, CCET    Virtual Visit No    Medication changes reported     No    Comments antibiotics, rabies shot    Fall or balance concerns reported    No    Warm-up and Cool-down Not performed (comment)   Yoga   Resistance Training Performed No    VAD Patient? No    PAD/SET Patient? No      Pain Assessment   Currently in Pain? No/denies    Multiple Pain Sites No                Social History   Tobacco Use  Smoking Status Former   Packs/day: 1.00   Years: 20.00   Additional pack years: 0.00   Total pack years: 20.00   Types: Cigarettes   Quit date: 07/02/1991   Years since quitting: 31.3  Smokeless Tobacco Never    Goals Met:  Independence with exercise equipment Exercise tolerated well No report of concerns or symptoms today  Goals Unmet:  Not Applicable  Comments: Pt able to follow exercise prescription today without complaint.  Will continue to monitor for progression.   Patient transferred from cardiac rehab to the CARE program. Today is session 1 of CARE program. Reviewed medical history and confirmed no new changes at this time. New consent signed. completed at rehab on 08/29/2022. Follow current exercise prescription and adjust as needed.  Yoga exercise performed today led by Fabio Pierce MA, RCEP, CCRP, CCET.  Dr. Bethann Punches is Medical Director for [facility]  Cardiac Rehabilitation.  Dr. Vida Rigger is Medical Director for [facility] Pulmonary Rehabilitation.

## 2022-10-23 ENCOUNTER — Other Ambulatory Visit: Payer: Self-pay | Admitting: Internal Medicine

## 2022-10-23 DIAGNOSIS — D801 Nonfamilial hypogammaglobulinemia: Secondary | ICD-10-CM | POA: Diagnosis not present

## 2022-10-23 DIAGNOSIS — I428 Other cardiomyopathies: Secondary | ICD-10-CM | POA: Diagnosis not present

## 2022-10-23 DIAGNOSIS — C9002 Multiple myeloma in relapse: Secondary | ICD-10-CM | POA: Diagnosis not present

## 2022-10-23 DIAGNOSIS — Z9481 Bone marrow transplant status: Secondary | ICD-10-CM | POA: Diagnosis not present

## 2022-10-23 DIAGNOSIS — K219 Gastro-esophageal reflux disease without esophagitis: Secondary | ICD-10-CM

## 2022-10-23 NOTE — Telephone Encounter (Signed)
Requested Prescriptions  Pending Prescriptions Disp Refills   omeprazole (PRILOSEC) 40 MG capsule [Pharmacy Med Name: OMEPRAZOLE 40 MG Capsule Delayed Release] 180 capsule 1    Sig: TAKE 1 CAPSULE IN THE MORNING AND TAKE 1 CAPSULE AT BEDTIME     Gastroenterology: Proton Pump Inhibitors Passed - 10/23/2022  2:29 AM      Passed - Valid encounter within last 12 months    Recent Outpatient Visits           6 days ago Cat bite of ankle, left, subsequent encounter   [facility] at [facility] Royal[facility], Ocie Bob, MD   4 weeks ago Acute frontal sinusitis, recurrence not specified   [facility] at [facility], Ocie Bob, MD   9 months ago Bacterial URI   [place] [facility] at [facility], Ocie Bob, MD   10 months ago Annual physical exam   [facility] Medicine at [facility], Nyoka Cowden, MD   1 year ago Viral URI with cough   [place] Primary [facility] & [facility] Medicine at [facility], Nyoka Cowden, MD       Future Appointments             In 6 days   Urgent Care at [facility]    In 1 month Judithann Graves, Nyoka Cowden, MD [facility] Primary [facility] & Sports Medicine at [facility], [facility]

## 2022-10-24 DIAGNOSIS — I493 Ventricular premature depolarization: Secondary | ICD-10-CM | POA: Diagnosis not present

## 2022-10-24 DIAGNOSIS — I080 Rheumatic disorders of both mitral and aortic valves: Secondary | ICD-10-CM | POA: Diagnosis not present

## 2022-10-24 DIAGNOSIS — C9000 Multiple myeloma not having achieved remission: Secondary | ICD-10-CM

## 2022-10-24 DIAGNOSIS — I5022 Chronic systolic (congestive) heart failure: Secondary | ICD-10-CM | POA: Diagnosis not present

## 2022-10-24 NOTE — Progress Notes (Signed)
Daily Session Note  Patient Details  Name: Sarah [name] MRN: [id number] Date of Birth: 27-Sep-1955 Referring Provider:   Flowsheet Row Cardiac Rehab from 08/29/2022 in [facility] Cardiac and Pulmonary Rehab  Referring Provider Joneen Roach, MD       Encounter Date: 10/24/2022  Check In:  Session Check In - 10/24/22 1247       Check-In   Supervising physician immediately available to respond to emergencies See telemetry face sheet for immediately available ER MD    Location [facility]-Cardiac & Pulmonary Rehab    Staff Present Ronette Deter, BS, Exercise Physiologist;Melissa Caiola MS, RDN, LDN    Virtual Visit No    Medication changes reported     No    Fall or balance concerns reported    No    Warm-up and Cool-down Performed on first and last piece of equipment    Resistance Training Performed Yes    VAD Patient? No    PAD/SET Patient? No      Pain Assessment   Currently in Pain? No/denies    Multiple Pain Sites No                Social History   Tobacco Use  Smoking Status Former   Packs/day: 1.00   Years: 20.00   Additional pack years: 0.00   Total pack years: 20.00   Types: Cigarettes   Quit date: 07/02/1991   Years since quitting: 31.3  Smokeless Tobacco Never    Goals Met:  Independence with exercise equipment Exercise tolerated well No report of concerns or symptoms today  Goals Unmet:  Not Applicable  Comments: Pt able to follow exercise prescription today without complaint.  Will continue to monitor for progression.    Dr. Bethann Punches is Medical Director for [facility] Cardiac Rehabilitation.  Dr. Vida Rigger is Medical Director for [facility] Pulmonary Rehabilitation.

## 2022-10-25 ENCOUNTER — Ambulatory Visit: Payer: Medicare PPO

## 2022-10-25 DIAGNOSIS — I4892 Unspecified atrial flutter: Secondary | ICD-10-CM | POA: Diagnosis not present

## 2022-10-25 DIAGNOSIS — I4891 Unspecified atrial fibrillation: Secondary | ICD-10-CM | POA: Diagnosis not present

## 2022-10-25 LAB — MULTIPLE MYELOMA PANEL, SERUM
Albumin SerPl Elph-Mcnc: 3.9 g/dL (ref 2.9–4.4)
Albumin/Glob SerPl: 1.4 (ref 0.7–1.7)
Alpha 1: 0.2 g/dL (ref 0.0–0.4)
Alpha2 Glob SerPl Elph-Mcnc: 0.9 g/dL (ref 0.4–1.0)
B-Globulin SerPl Elph-Mcnc: 0.9 g/dL (ref 0.7–1.3)
Gamma Glob SerPl Elph-Mcnc: 0.9 g/dL (ref 0.4–1.8)
Globulin, Total: 2.8 g/dL (ref 2.2–3.9)
IgA: 229 mg/dL (ref 87–352)
IgG (Immunoglobin G), Serum: 939 mg/dL (ref 586–1602)
IgM (Immunoglobulin M), Srm: 34 mg/dL (ref 26–217)
Total Protein ELP: 6.7 g/dL (ref 6.0–8.5)

## 2022-10-28 ENCOUNTER — Inpatient Hospital Stay: Payer: Medicare PPO

## 2022-10-28 ENCOUNTER — Ambulatory Visit: Payer: Medicare PPO

## 2022-10-28 ENCOUNTER — Other Ambulatory Visit: Payer: Medicare PPO

## 2022-10-28 VITALS — BP 130/75 | HR 60 | Temp 97.9°F | Resp 18

## 2022-10-28 DIAGNOSIS — I129 Hypertensive chronic kidney disease with stage 1 through stage 4 chronic kidney disease, or unspecified chronic kidney disease: Secondary | ICD-10-CM | POA: Diagnosis not present

## 2022-10-28 DIAGNOSIS — Z9221 Personal history of antineoplastic chemotherapy: Secondary | ICD-10-CM | POA: Diagnosis not present

## 2022-10-28 DIAGNOSIS — C9001 Multiple myeloma in remission: Secondary | ICD-10-CM | POA: Diagnosis not present

## 2022-10-28 DIAGNOSIS — C9000 Multiple myeloma not having achieved remission: Secondary | ICD-10-CM

## 2022-10-28 DIAGNOSIS — Z79899 Other long term (current) drug therapy: Secondary | ICD-10-CM | POA: Diagnosis not present

## 2022-10-28 DIAGNOSIS — N1832 Chronic kidney disease, stage 3b: Secondary | ICD-10-CM | POA: Diagnosis not present

## 2022-10-28 DIAGNOSIS — I2510 Atherosclerotic heart disease of native coronary artery without angina pectoris: Secondary | ICD-10-CM | POA: Diagnosis not present

## 2022-10-28 DIAGNOSIS — Z95828 Presence of other vascular implants and grafts: Secondary | ICD-10-CM

## 2022-10-28 LAB — MAGNESIUM: Magnesium: 1.3 mg/dL — ABNORMAL LOW (ref 1.7–2.4)

## 2022-10-28 MED ORDER — SODIUM CHLORIDE 0.9 % IV SOLN
Freq: Once | INTRAVENOUS | Status: AC
  Filled 2022-10-28: qty 250

## 2022-10-28 MED ORDER — MAGNESIUM SULFATE 2 GM/50ML IV SOLN
2.0000 g | Freq: Once | INTRAVENOUS | Status: AC
  Administered 2022-10-28: 2 g via INTRAVENOUS
  Filled 2022-10-28: qty 50

## 2022-10-28 MED ORDER — MAGNESIUM SULFATE 4 GM/100ML IV SOLN
4.0000 g | Freq: Once | INTRAVENOUS | Status: AC
  Administered 2022-10-28: 4 g via INTRAVENOUS
  Filled 2022-10-28: qty 100

## 2022-10-28 MED ORDER — SODIUM CHLORIDE 0.9 % IV SOLN
25.0000 mg | Freq: Once | INTRAVENOUS | Status: AC
  Administered 2022-10-28: 25 mg via INTRAVENOUS
  Filled 2022-10-28: qty 1

## 2022-10-28 MED ORDER — SODIUM CHLORIDE 0.9% FLUSH
10.0000 mL | Freq: Once | INTRAVENOUS | Status: AC
  Administered 2022-10-28: 10 mL via INTRAVENOUS
  Filled 2022-10-28: qty 10

## 2022-10-28 MED ORDER — HEPARIN SOD (PORK) LOCK FLUSH 100 UNIT/ML IV SOLN
500.0000 [IU] | Freq: Once | INTRAVENOUS | Status: AC
  Administered 2022-10-28: 500 [IU]
  Filled 2022-10-28: qty 5

## 2022-10-29 ENCOUNTER — Ambulatory Visit: Payer: Medicare PPO

## 2022-10-29 ENCOUNTER — Ambulatory Visit
Admission: RE | Admit: 2022-10-29 | Discharge: 2022-10-29 | Disposition: A | Discharge status: Home / Self Care | Payer: Medicare PPO | Source: Ambulatory Visit | Attending: Internal Medicine | Admitting: Internal Medicine

## 2022-10-29 DIAGNOSIS — M545 Low back pain, unspecified: Secondary | ICD-10-CM | POA: Diagnosis not present

## 2022-10-29 DIAGNOSIS — R200 Anesthesia of skin: Secondary | ICD-10-CM | POA: Diagnosis not present

## 2022-10-29 DIAGNOSIS — F419 Anxiety disorder, unspecified: Secondary | ICD-10-CM | POA: Diagnosis not present

## 2022-10-29 DIAGNOSIS — Z23 Encounter for immunization: Secondary | ICD-10-CM

## 2022-10-29 DIAGNOSIS — Z203 Contact with and (suspected) exposure to rabies: Secondary | ICD-10-CM | POA: Diagnosis not present

## 2022-10-29 DIAGNOSIS — F32A Depression, unspecified: Secondary | ICD-10-CM | POA: Diagnosis not present

## 2022-10-29 DIAGNOSIS — C9000 Multiple myeloma not having achieved remission: Secondary | ICD-10-CM

## 2022-10-29 DIAGNOSIS — R202 Paresthesia of skin: Secondary | ICD-10-CM | POA: Diagnosis not present

## 2022-10-29 DIAGNOSIS — T451X5A Adverse effect of antineoplastic and immunosuppressive drugs, initial encounter: Secondary | ICD-10-CM | POA: Diagnosis not present

## 2022-10-29 DIAGNOSIS — M542 Cervicalgia: Secondary | ICD-10-CM | POA: Diagnosis not present

## 2022-10-29 DIAGNOSIS — G620 Drug-induced polyneuropathy: Secondary | ICD-10-CM | POA: Diagnosis not present

## 2022-10-29 MED ORDER — RABIES VACCINE, PCEC IM SUSR
1.0000 mL | Freq: Once | INTRAMUSCULAR | Status: AC
  Administered 2022-10-29: 1 mL via INTRAMUSCULAR

## 2022-10-29 NOTE — Progress Notes (Signed)
Daily Session Note  Patient Details  Name: Sarah [name] MRN: [id number] Date of Birth: 01/31/1956 Referring Provider:   Flowsheet Row Cardiac Rehab from 08/29/2022 in [facility] Cardiac and Pulmonary Rehab  Referring Provider Joneen Roach, MD       Encounter Date: 10/29/2022  Check In:  Session Check In - 10/29/22 1234       Check-In   Supervising physician immediately available to respond to emergencies See telemetry face sheet for immediately available ER MD    Location [facility]-Cardiac & Pulmonary Rehab    Staff Present Ronette Deter, BS, Exercise Physiologist;Melissa Mullan MS, RDN, LDN;Jessica [place], MA, RCEP, CCRP, Zackery Barefoot, MS, ACSM CEP, Exercise Physiologist    Virtual Visit No    Medication changes reported     No    Fall or balance concerns reported    No    Warm-up and Cool-down Performed on first and last piece of equipment    Resistance Training Performed Yes    VAD Patient? No    PAD/SET Patient? No      Pain Assessment   Currently in Pain? No/denies    Multiple Pain Sites No                Social History   Tobacco Use  Smoking Status Former   Packs/day: 1.00   Years: 20.00   Additional pack years: 0.00   Total pack years: 20.00   Types: Cigarettes   Quit date: 07/02/1991   Years since quitting: 31.3  Smokeless Tobacco Never    Goals Met:  Independence with exercise equipment Exercise tolerated well No report of concerns or symptoms today  Goals Unmet:  Not Applicable  Comments: Pt able to follow exercise prescription today without complaint.  Will continue to monitor for progression.    Dr. Bethann Punches is Medical Director for [facility] Cardiac Rehabilitation.  Dr. Vida Rigger is Medical Director for [facility] Pulmonary Rehabilitation.

## 2022-10-29 NOTE — ED Triage Notes (Signed)
Pt presents to UC for 4th/final dose of rabies vaccine.

## 2022-10-31 ENCOUNTER — Encounter: Payer: Medicare PPO | Attending: Family Medicine

## 2022-10-31 DIAGNOSIS — C9000 Multiple myeloma not having achieved remission: Secondary | ICD-10-CM

## 2022-10-31 NOTE — Progress Notes (Signed)
Daily Session Note  Patient Details  Name: Sarah [name] MRN: [id number] Date of Birth: 10-25-1955 Referring Provider:   Flowsheet Row Cardiac Rehab from 08/29/2022 in [facility] Cardiac and Pulmonary Rehab  Referring Provider Joneen Roach, MD       Encounter Date: 10/31/2022  Check In:  Session Check In - 10/31/22 1325       Check-In   Supervising physician immediately available to respond to emergencies See telemetry face sheet for immediately available ER MD    Location [facility]-Cardiac & Pulmonary Rehab    Staff Present Ronette Deter, BS, Exercise Physiologist;Kara Clinton Sawyer, MS, ACSM CEP, Exercise Physiologist    Virtual Visit No    Medication changes reported     Yes    Comments increased trimodol    Fall or balance concerns reported    No    Warm-up and Cool-down Performed on first and last piece of equipment    Resistance Training Performed Yes    VAD Patient? No    PAD/SET Patient? No      Pain Assessment   Currently in Pain? No/denies    Multiple Pain Sites No                Social History   Tobacco Use  Smoking Status Former   Packs/day: 1.00   Years: 20.00   Additional pack years: 0.00   Total pack years: 20.00   Types: Cigarettes   Quit date: 07/02/1991   Years since quitting: 31.3  Smokeless Tobacco Never    Goals Met:  Independence with exercise equipment Exercise tolerated well No report of concerns or symptoms today  Goals Unmet:  Not Applicable  Comments: Pt able to follow exercise prescription today without complaint.  Will continue to monitor for progression.    Dr. Bethann Punches is Medical Director for [facility] Cardiac Rehabilitation.  Dr. Vida Rigger is Medical Director for [facility] Pulmonary Rehabilitation.

## 2022-11-04 ENCOUNTER — Inpatient Hospital Stay: Payer: Medicare PPO | Attending: Oncology

## 2022-11-04 ENCOUNTER — Inpatient Hospital Stay: Payer: Medicare PPO

## 2022-11-04 VITALS — BP 114/59 | HR 57 | Temp 96.8°F | Resp 20

## 2022-11-04 DIAGNOSIS — C9001 Multiple myeloma in remission: Secondary | ICD-10-CM | POA: Insufficient documentation

## 2022-11-04 DIAGNOSIS — C9000 Multiple myeloma not having achieved remission: Secondary | ICD-10-CM

## 2022-11-04 DIAGNOSIS — Z79899 Other long term (current) drug therapy: Secondary | ICD-10-CM | POA: Diagnosis not present

## 2022-11-04 LAB — MAGNESIUM: Magnesium: 1.1 mg/dL — ABNORMAL LOW (ref 1.7–2.4)

## 2022-11-04 MED ORDER — MAGNESIUM SULFATE 2 GM/50ML IV SOLN
2.0000 g | Freq: Once | INTRAVENOUS | Status: AC
  Administered 2022-11-04: 2 g via INTRAVENOUS
  Filled 2022-11-04: qty 50

## 2022-11-04 MED ORDER — SODIUM CHLORIDE 0.9% FLUSH
10.0000 mL | Freq: Once | INTRAVENOUS | Status: DC | PRN
  Filled 2022-11-04: qty 10

## 2022-11-04 MED ORDER — MAGNESIUM SULFATE 4 GM/100ML IV SOLN
4.0000 g | Freq: Once | INTRAVENOUS | Status: AC
  Administered 2022-11-04: 4 g via INTRAVENOUS
  Filled 2022-11-04: qty 100

## 2022-11-04 MED ORDER — SODIUM CHLORIDE 0.9 % IV SOLN
25.0000 mg | Freq: Once | INTRAVENOUS | Status: AC
  Administered 2022-11-04: 25 mg via INTRAVENOUS
  Filled 2022-11-04: qty 1

## 2022-11-04 MED ORDER — SODIUM CHLORIDE 0.9 % IV SOLN
Freq: Once | INTRAVENOUS | Status: AC
  Filled 2022-11-04: qty 250

## 2022-11-04 MED ORDER — HEPARIN SOD (PORK) LOCK FLUSH 100 UNIT/ML IV SOLN
500.0000 [IU] | Freq: Once | INTRAVENOUS | Status: AC | PRN
  Administered 2022-11-04: 500 [IU]
  Filled 2022-11-04: qty 5

## 2022-11-05 ENCOUNTER — Encounter: Payer: Medicare PPO | Admitting: *Deleted

## 2022-11-05 DIAGNOSIS — C9000 Multiple myeloma not having achieved remission: Secondary | ICD-10-CM

## 2022-11-05 NOTE — Progress Notes (Signed)
Daily Session Note  Patient Details  Name: Sarah [name] MRN: [id number] Date of Birth: 1956-02-24 Referring Provider:   Flowsheet Row Cardiac Rehab from 08/29/2022 in [facility] Cardiac and Pulmonary Rehab  Referring Provider Joneen Roach, MD       Encounter Date: 11/05/2022  Check In:  Session Check In - 11/05/22 1241       Check-In   Supervising physician immediately available to respond to emergencies See telemetry face sheet for immediately available ER MD    Location [facility]-Cardiac & Pulmonary Rehab    Staff Present Ronette Deter, BS, Exercise Physiologist;Kara Clinton Sawyer, MS, ACSM CEP, Exercise Physiologist;Devonta Blanford Juanetta Gosling, MA, RCEP, CCRP, CCET    Virtual Visit No    Medication changes reported     No    Fall or balance concerns reported    No    Warm-up and Cool-down Performed on first and last piece of equipment    Resistance Training Performed Yes    VAD Patient? No    PAD/SET Patient? No      Pain Assessment   Currently in Pain? No/denies                Social History   Tobacco Use  Smoking Status Former   Packs/day: 1.00   Years: 20.00   Additional pack years: 0.00   Total pack years: 20.00   Types: Cigarettes   Quit date: 07/02/1991   Years since quitting: 31.3  Smokeless Tobacco Never    Goals Met:  Proper associated with RPD/PD & O2 Sat Independence with exercise equipment Exercise tolerated well No report of concerns or symptoms today Strength training completed today  Goals Unmet:  Not Applicable  Comments: Pt able to follow exercise prescription today without complaint.  Will continue to monitor for progression.    Dr. Bethann Punches is Medical Director for [facility] Cardiac Rehabilitation.  Dr. Vida Rigger is Medical Director for [facility] Pulmonary Rehabilitation.

## 2022-11-08 ENCOUNTER — Ambulatory Visit: Payer: Medicare PPO

## 2022-11-11 ENCOUNTER — Inpatient Hospital Stay: Payer: Medicare PPO

## 2022-11-11 VITALS — BP 132/77 | HR 80 | Temp 97.0°F | Resp 20

## 2022-11-11 DIAGNOSIS — C9000 Multiple myeloma not having achieved remission: Secondary | ICD-10-CM

## 2022-11-11 DIAGNOSIS — Z79899 Other long term (current) drug therapy: Secondary | ICD-10-CM | POA: Diagnosis not present

## 2022-11-11 DIAGNOSIS — C9001 Multiple myeloma in remission: Secondary | ICD-10-CM | POA: Diagnosis not present

## 2022-11-11 LAB — MAGNESIUM: Magnesium: 1.1 mg/dL — ABNORMAL LOW (ref 1.7–2.4)

## 2022-11-11 MED ORDER — SODIUM CHLORIDE 0.9 % IV SOLN
25.0000 mg | Freq: Once | INTRAVENOUS | Status: AC
  Administered 2022-11-11: 25 mg via INTRAVENOUS
  Filled 2022-11-11: qty 1

## 2022-11-11 MED ORDER — SODIUM CHLORIDE 0.9% FLUSH
10.0000 mL | Freq: Once | INTRAVENOUS | Status: AC | PRN
  Administered 2022-11-11: 10 mL
  Filled 2022-11-11: qty 10

## 2022-11-11 MED ORDER — HEPARIN SOD (PORK) LOCK FLUSH 100 UNIT/ML IV SOLN
500.0000 [IU] | Freq: Once | INTRAVENOUS | Status: AC | PRN
  Administered 2022-11-11: 500 [IU]
  Filled 2022-11-11: qty 5

## 2022-11-11 MED ORDER — SODIUM CHLORIDE 0.9 % IV SOLN
Freq: Once | INTRAVENOUS | Status: AC
  Filled 2022-11-11: qty 250

## 2022-11-11 MED ORDER — MAGNESIUM SULFATE 2 GM/50ML IV SOLN
2.0000 g | Freq: Once | INTRAVENOUS | Status: AC
  Administered 2022-11-11: 2 g via INTRAVENOUS
  Filled 2022-11-11: qty 50

## 2022-11-11 MED ORDER — MAGNESIUM SULFATE 4 GM/100ML IV SOLN
4.0000 g | Freq: Once | INTRAVENOUS | Status: AC
  Administered 2022-11-11: 4 g via INTRAVENOUS
  Filled 2022-11-11: qty 100

## 2022-11-12 ENCOUNTER — Encounter: Payer: Medicare PPO | Admitting: *Deleted

## 2022-11-12 DIAGNOSIS — C9000 Multiple myeloma not having achieved remission: Secondary | ICD-10-CM

## 2022-11-12 NOTE — Progress Notes (Signed)
Daily Session Note  Patient Details  Name: Sarah [name] MRN: [id number] Date of Birth: [birth year] Referring Provider:   Flowsheet Row Cardiac Rehab from 08/29/2022 in [facility] Cardiac and Pulmonary Rehab  Referring Provider Joneen Roach, MD       Encounter Date: 11/12/2022  Check In:  Session Check In - 11/12/22 1225       Check-In   Supervising physician immediately available to respond to emergencies See telemetry face sheet for immediately available ER MD    Location [facility]-Cardiac & Pulmonary Rehab    Staff Present Salley Scarlet, MS, ACSM CEP, Exercise Physiologist;Librado Guandique Juanetta Gosling, MA, RCEP, CCRP, CCET    Virtual Visit No    Medication changes reported     No    Fall or balance concerns reported    No    Warm-up and Cool-down Performed on first and last piece of equipment    Resistance Training Performed Yes    VAD Patient? No    PAD/SET Patient? No      Pain Assessment   Currently in Pain? No/denies                Social History   Tobacco Use  Smoking Status Former   Packs/day: 1.00   Years: 20.00   Additional pack years: 0.00   Total pack years: 20.00   Types: Cigarettes   Quit date: 07/02/1991   Years since quitting: 31.3  Smokeless Tobacco Never    Goals Met:  Proper associated with RPD/PD & O2 Sat Independence with exercise equipment Exercise tolerated well No report of concerns or symptoms today Strength training completed today  Goals Unmet:  Not Applicable  Comments: Pt able to follow exercise prescription today without complaint.  Will continue to monitor for progression.    Dr. Bethann Punches is Medical Director for [facility] Cardiac Rehabilitation.  Dr. Vida Rigger is Medical Director for [facility] Pulmonary Rehabilitation.

## 2022-11-17 ENCOUNTER — Other Ambulatory Visit: Payer: Self-pay | Admitting: Internal Medicine

## 2022-11-17 DIAGNOSIS — M47816 Spondylosis without myelopathy or radiculopathy, lumbar region: Secondary | ICD-10-CM

## 2022-11-18 ENCOUNTER — Inpatient Hospital Stay: Payer: Medicare PPO

## 2022-11-18 VITALS — BP 109/65 | HR 70 | Temp 97.9°F | Resp 20

## 2022-11-18 DIAGNOSIS — C9000 Multiple myeloma not having achieved remission: Secondary | ICD-10-CM

## 2022-11-18 DIAGNOSIS — Z95828 Presence of other vascular implants and grafts: Secondary | ICD-10-CM

## 2022-11-18 DIAGNOSIS — C9001 Multiple myeloma in remission: Secondary | ICD-10-CM | POA: Diagnosis not present

## 2022-11-18 DIAGNOSIS — Z79899 Other long term (current) drug therapy: Secondary | ICD-10-CM | POA: Diagnosis not present

## 2022-11-18 LAB — MAGNESIUM: Magnesium: 1.3 mg/dL — ABNORMAL LOW (ref 1.7–2.4)

## 2022-11-18 MED ORDER — SODIUM CHLORIDE 0.9% FLUSH
10.0000 mL | Freq: Once | INTRAVENOUS | Status: AC
  Administered 2022-11-18: 10 mL via INTRAVENOUS
  Filled 2022-11-18: qty 10

## 2022-11-18 MED ORDER — MAGNESIUM SULFATE 2 GM/50ML IV SOLN
2.0000 g | Freq: Once | INTRAVENOUS | Status: AC
  Administered 2022-11-18: 2 g via INTRAVENOUS
  Filled 2022-11-18: qty 50

## 2022-11-18 MED ORDER — TIZANIDINE HCL 4 MG PO TABS
4.0000 mg | ORAL_TABLET | Freq: Four times a day (QID) | ORAL | 0 refills | Status: DC | PRN
Start: 2022-11-18 — End: 2023-03-07

## 2022-11-18 MED ORDER — HEPARIN SOD (PORK) LOCK FLUSH 100 UNIT/ML IV SOLN
500.0000 [IU] | Freq: Once | INTRAVENOUS | Status: AC
  Administered 2022-11-18: 500 [IU]
  Filled 2022-11-18: qty 5

## 2022-11-18 MED ORDER — SODIUM CHLORIDE 0.9 % IV SOLN
25.0000 mg | Freq: Once | INTRAVENOUS | Status: AC
  Administered 2022-11-18: 25 mg via INTRAVENOUS
  Filled 2022-11-18: qty 1

## 2022-11-18 MED ORDER — SODIUM CHLORIDE 0.9 % IV SOLN
Freq: Once | INTRAVENOUS | Status: AC
  Filled 2022-11-18: qty 250

## 2022-11-18 MED ORDER — MAGNESIUM SULFATE 4 GM/100ML IV SOLN
4.0000 g | Freq: Once | INTRAVENOUS | Status: AC
  Administered 2022-11-18: 4 g via INTRAVENOUS
  Filled 2022-11-18: qty 100

## 2022-11-26 ENCOUNTER — Inpatient Hospital Stay: Payer: Medicare PPO

## 2022-11-26 ENCOUNTER — Encounter: Payer: Self-pay | Admitting: Internal Medicine

## 2022-11-26 VITALS — BP 119/66 | HR 60 | Temp 97.5°F | Resp 20

## 2022-11-26 DIAGNOSIS — C9001 Multiple myeloma in remission: Secondary | ICD-10-CM | POA: Diagnosis not present

## 2022-11-26 DIAGNOSIS — Z79899 Other long term (current) drug therapy: Secondary | ICD-10-CM | POA: Diagnosis not present

## 2022-11-26 DIAGNOSIS — C9000 Multiple myeloma not having achieved remission: Secondary | ICD-10-CM

## 2022-11-26 LAB — MAGNESIUM: Magnesium: 1.2 mg/dL — ABNORMAL LOW (ref 1.7–2.4)

## 2022-11-26 MED ORDER — SODIUM CHLORIDE 0.9 % IV SOLN
Freq: Once | INTRAVENOUS | Status: AC
  Filled 2022-11-26: qty 250

## 2022-11-26 MED ORDER — SODIUM CHLORIDE 0.9 % IV SOLN
25.0000 mg | Freq: Once | INTRAVENOUS | Status: AC
  Administered 2022-11-26: 25 mg via INTRAVENOUS
  Filled 2022-11-26: qty 1

## 2022-11-26 MED ORDER — MAGNESIUM SULFATE 4 GM/100ML IV SOLN
4.0000 g | Freq: Once | INTRAVENOUS | Status: AC
  Administered 2022-11-26: 4 g via INTRAVENOUS
  Filled 2022-11-26: qty 100

## 2022-11-26 MED ORDER — SODIUM CHLORIDE 0.9% FLUSH
10.0000 mL | Freq: Once | INTRAVENOUS | Status: AC | PRN
  Administered 2022-11-26: 10 mL
  Filled 2022-11-26: qty 10

## 2022-11-26 MED ORDER — MAGNESIUM SULFATE 2 GM/50ML IV SOLN
2.0000 g | Freq: Once | INTRAVENOUS | Status: AC
  Administered 2022-11-26: 2 g via INTRAVENOUS
  Filled 2022-11-26: qty 50

## 2022-11-26 MED ORDER — HEPARIN SOD (PORK) LOCK FLUSH 100 UNIT/ML IV SOLN
500.0000 [IU] | Freq: Once | INTRAVENOUS | Status: AC | PRN
  Administered 2022-11-26: 500 [IU]
  Filled 2022-11-26: qty 5

## 2022-11-28 DIAGNOSIS — I350 Nonrheumatic aortic (valve) stenosis: Secondary | ICD-10-CM | POA: Diagnosis not present

## 2022-11-28 DIAGNOSIS — I5022 Chronic systolic (congestive) heart failure: Secondary | ICD-10-CM | POA: Diagnosis not present

## 2022-11-28 DIAGNOSIS — Z79899 Other long term (current) drug therapy: Secondary | ICD-10-CM | POA: Diagnosis not present

## 2022-11-28 DIAGNOSIS — I493 Ventricular premature depolarization: Secondary | ICD-10-CM | POA: Diagnosis not present

## 2022-11-28 DIAGNOSIS — I428 Other cardiomyopathies: Secondary | ICD-10-CM | POA: Diagnosis not present

## 2022-12-02 ENCOUNTER — Inpatient Hospital Stay (HOSPITAL_BASED_OUTPATIENT_CLINIC_OR_DEPARTMENT_OTHER): Payer: Medicare PPO | Admitting: Oncology

## 2022-12-02 ENCOUNTER — Inpatient Hospital Stay: Payer: Medicare PPO | Attending: Oncology

## 2022-12-02 ENCOUNTER — Inpatient Hospital Stay: Payer: Medicare PPO

## 2022-12-02 ENCOUNTER — Encounter: Payer: Self-pay | Admitting: Oncology

## 2022-12-02 VITALS — BP 139/73 | HR 61 | Temp 96.9°F | Resp 18 | Wt 155.9 lb

## 2022-12-02 DIAGNOSIS — D500 Iron deficiency anemia secondary to blood loss (chronic): Secondary | ICD-10-CM

## 2022-12-02 DIAGNOSIS — C9001 Multiple myeloma in remission: Secondary | ICD-10-CM

## 2022-12-02 DIAGNOSIS — I130 Hypertensive heart and chronic kidney disease with heart failure and stage 1 through stage 4 chronic kidney disease, or unspecified chronic kidney disease: Secondary | ICD-10-CM | POA: Diagnosis not present

## 2022-12-02 DIAGNOSIS — K219 Gastro-esophageal reflux disease without esophagitis: Secondary | ICD-10-CM | POA: Diagnosis not present

## 2022-12-02 DIAGNOSIS — Z9481 Bone marrow transplant status: Secondary | ICD-10-CM | POA: Insufficient documentation

## 2022-12-02 DIAGNOSIS — Z841 Family history of disorders of kidney and ureter: Secondary | ICD-10-CM | POA: Insufficient documentation

## 2022-12-02 DIAGNOSIS — D509 Iron deficiency anemia, unspecified: Secondary | ICD-10-CM | POA: Insufficient documentation

## 2022-12-02 DIAGNOSIS — I5032 Chronic diastolic (congestive) heart failure: Secondary | ICD-10-CM | POA: Diagnosis not present

## 2022-12-02 DIAGNOSIS — Z79899 Other long term (current) drug therapy: Secondary | ICD-10-CM | POA: Insufficient documentation

## 2022-12-02 DIAGNOSIS — N1832 Chronic kidney disease, stage 3b: Secondary | ICD-10-CM | POA: Insufficient documentation

## 2022-12-02 DIAGNOSIS — G629 Polyneuropathy, unspecified: Secondary | ICD-10-CM | POA: Diagnosis not present

## 2022-12-02 DIAGNOSIS — Z803 Family history of malignant neoplasm of breast: Secondary | ICD-10-CM | POA: Insufficient documentation

## 2022-12-02 DIAGNOSIS — T451X5A Adverse effect of antineoplastic and immunosuppressive drugs, initial encounter: Secondary | ICD-10-CM | POA: Diagnosis not present

## 2022-12-02 DIAGNOSIS — Z7682 Awaiting organ transplant status: Secondary | ICD-10-CM

## 2022-12-02 DIAGNOSIS — Z888 Allergy status to other drugs, medicaments and biological substances status: Secondary | ICD-10-CM | POA: Diagnosis not present

## 2022-12-02 DIAGNOSIS — R197 Diarrhea, unspecified: Secondary | ICD-10-CM

## 2022-12-02 DIAGNOSIS — Z885 Allergy status to narcotic agent status: Secondary | ICD-10-CM | POA: Insufficient documentation

## 2022-12-02 DIAGNOSIS — Z9049 Acquired absence of other specified parts of digestive tract: Secondary | ICD-10-CM | POA: Diagnosis not present

## 2022-12-02 DIAGNOSIS — Z808 Family history of malignant neoplasm of other organs or systems: Secondary | ICD-10-CM | POA: Insufficient documentation

## 2022-12-02 DIAGNOSIS — M2550 Pain in unspecified joint: Secondary | ICD-10-CM | POA: Diagnosis not present

## 2022-12-02 DIAGNOSIS — Z822 Family history of deafness and hearing loss: Secondary | ICD-10-CM | POA: Insufficient documentation

## 2022-12-02 DIAGNOSIS — Z833 Family history of diabetes mellitus: Secondary | ICD-10-CM | POA: Insufficient documentation

## 2022-12-02 DIAGNOSIS — Z90710 Acquired absence of both cervix and uterus: Secondary | ICD-10-CM | POA: Diagnosis not present

## 2022-12-02 DIAGNOSIS — G620 Drug-induced polyneuropathy: Secondary | ICD-10-CM | POA: Insufficient documentation

## 2022-12-02 DIAGNOSIS — Z818 Family history of other mental and behavioral disorders: Secondary | ICD-10-CM | POA: Insufficient documentation

## 2022-12-02 DIAGNOSIS — M549 Dorsalgia, unspecified: Secondary | ICD-10-CM | POA: Diagnosis not present

## 2022-12-02 DIAGNOSIS — Z9484 Stem cells transplant status: Secondary | ICD-10-CM | POA: Insufficient documentation

## 2022-12-02 DIAGNOSIS — I350 Nonrheumatic aortic (valve) stenosis: Secondary | ICD-10-CM | POA: Insufficient documentation

## 2022-12-02 DIAGNOSIS — Z87891 Personal history of nicotine dependence: Secondary | ICD-10-CM | POA: Insufficient documentation

## 2022-12-02 DIAGNOSIS — Z95828 Presence of other vascular implants and grafts: Secondary | ICD-10-CM

## 2022-12-02 DIAGNOSIS — Z8249 Family history of ischemic heart disease and other diseases of the circulatory system: Secondary | ICD-10-CM | POA: Insufficient documentation

## 2022-12-02 DIAGNOSIS — Z792 Long term (current) use of antibiotics: Secondary | ICD-10-CM | POA: Insufficient documentation

## 2022-12-02 DIAGNOSIS — C9000 Multiple myeloma not having achieved remission: Secondary | ICD-10-CM

## 2022-12-02 DIAGNOSIS — I429 Cardiomyopathy, unspecified: Secondary | ICD-10-CM | POA: Diagnosis not present

## 2022-12-02 DIAGNOSIS — Z886 Allergy status to analgesic agent status: Secondary | ICD-10-CM | POA: Insufficient documentation

## 2022-12-02 DIAGNOSIS — E1122 Type 2 diabetes mellitus with diabetic chronic kidney disease: Secondary | ICD-10-CM | POA: Insufficient documentation

## 2022-12-02 DIAGNOSIS — Z801 Family history of malignant neoplasm of trachea, bronchus and lung: Secondary | ICD-10-CM | POA: Insufficient documentation

## 2022-12-02 DIAGNOSIS — Z800 Family history of malignant neoplasm of digestive organs: Secondary | ICD-10-CM | POA: Insufficient documentation

## 2022-12-02 DIAGNOSIS — Z832 Family history of diseases of the blood and blood-forming organs and certain disorders involving the immune mechanism: Secondary | ICD-10-CM | POA: Insufficient documentation

## 2022-12-02 LAB — CMP (CANCER CENTER ONLY)
ALT: 20 U/L (ref 0–44)
AST: 23 U/L (ref 15–41)
Albumin: 4.2 g/dL (ref 3.5–5.0)
Alkaline Phosphatase: 85 U/L (ref 38–126)
Anion gap: 6 (ref 5–15)
BUN: 35 mg/dL — ABNORMAL HIGH (ref 8–23)
CO2: 23 mmol/L (ref 22–32)
Calcium: 8.5 mg/dL — ABNORMAL LOW (ref 8.9–10.3)
Chloride: 108 mmol/L (ref 98–111)
Creatinine: 1.42 mg/dL — ABNORMAL HIGH (ref 0.44–1.00)
GFR, Estimated: 41 mL/min — ABNORMAL LOW (ref >60)
Glucose, Bld: 143 mg/dL — ABNORMAL HIGH (ref 70–99)
Potassium: 4 mmol/L (ref 3.5–5.1)
Sodium: 137 mmol/L (ref 135–145)
Total Bilirubin: 0.3 mg/dL (ref 0.3–1.2)
Total Protein: 7.4 g/dL (ref 6.5–8.1)

## 2022-12-02 LAB — CBC WITH DIFFERENTIAL (CANCER CENTER ONLY)
Abs Immature Granulocytes: 0.02 10*3/uL (ref 0.00–0.07)
Basophils Absolute: 0 10*3/uL (ref 0.0–0.1)
Basophils Relative: 1 %
Eosinophils Absolute: 0.1 10*3/uL (ref 0.0–0.5)
Eosinophils Relative: 2 %
HCT: 37.1 % (ref 36.0–46.0)
Hemoglobin: 12.4 g/dL (ref 12.0–15.0)
Immature Granulocytes: 0 %
Lymphocytes Relative: 33 %
Lymphs Abs: 1.7 10*3/uL (ref 0.7–4.0)
MCH: 28.6 pg (ref 26.0–34.0)
MCHC: 33.4 g/dL (ref 30.0–36.0)
MCV: 85.7 fL (ref 80.0–100.0)
Monocytes Absolute: 0.4 10*3/uL (ref 0.1–1.0)
Monocytes Relative: 8 %
Neutro Abs: 2.9 10*3/uL (ref 1.7–7.7)
Neutrophils Relative %: 56 %
Platelet Count: 126 10*3/uL — ABNORMAL LOW (ref 150–400)
RBC: 4.33 MIL/uL (ref 3.87–5.11)
RDW: 14 % (ref 11.5–15.5)
WBC Count: 5.2 10*3/uL (ref 4.0–10.5)
nRBC: 0 % (ref 0.0–0.2)

## 2022-12-02 LAB — IRON AND TIBC
Iron: 69 ug/dL (ref 28–170)
Saturation Ratios: 18 % (ref 10.4–31.8)
TIBC: 375 ug/dL (ref 250–450)
UIBC: 306 ug/dL

## 2022-12-02 LAB — FERRITIN: Ferritin: 26 ng/mL (ref 11–307)

## 2022-12-02 LAB — MAGNESIUM: Magnesium: 1.4 mg/dL — ABNORMAL LOW (ref 1.7–2.4)

## 2022-12-02 MED ORDER — HEPARIN SOD (PORK) LOCK FLUSH 100 UNIT/ML IV SOLN
500.0000 [IU] | Freq: Once | INTRAVENOUS | Status: AC
  Administered 2022-12-02: 500 [IU] via INTRAVENOUS
  Filled 2022-12-02: qty 5

## 2022-12-02 MED ORDER — MAGNESIUM SULFATE 2 GM/50ML IV SOLN
2.0000 g | Freq: Once | INTRAVENOUS | Status: AC
  Administered 2022-12-02: 2 g via INTRAVENOUS
  Filled 2022-12-02: qty 50

## 2022-12-02 MED ORDER — SODIUM CHLORIDE 0.9 % IV SOLN
Freq: Once | INTRAVENOUS | Status: AC
  Filled 2022-12-02: qty 250

## 2022-12-02 MED ORDER — CYANOCOBALAMIN 1000 MCG/ML IJ SOLN
1000.0000 ug | Freq: Once | INTRAMUSCULAR | Status: AC
  Administered 2022-12-02: 1000 ug via INTRAMUSCULAR
  Filled 2022-12-02: qty 1

## 2022-12-02 MED ORDER — SODIUM CHLORIDE 0.9% FLUSH
10.0000 mL | Freq: Once | INTRAVENOUS | Status: AC
  Administered 2022-12-02: 10 mL via INTRAVENOUS
  Filled 2022-12-02: qty 10

## 2022-12-02 MED ORDER — SODIUM CHLORIDE 0.9 % IV SOLN
25.0000 mg | Freq: Once | INTRAVENOUS | Status: AC
  Administered 2022-12-02: 25 mg via INTRAVENOUS
  Filled 2022-12-02: qty 1

## 2022-12-02 NOTE — Assessment & Plan Note (Signed)
-  [facility]- Post transplant RN coordinator ([phone]). 

## 2022-12-02 NOTE — Progress Notes (Signed)
Hematology/Oncology Progress note [phone] [phone]     Chief Complaint: Sarah [name] is a 67 y.o. 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  Light chain ratio is normal. 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 Repeat immunofixation negative. . Labs reviewed and discussed with patient, stable M protein and stable light chain ratio Today's level is pending.   Daratumumab on hold due to frequent infections despite IVIG Continue acyclovir prophylaxis.    Iron deficiency anemia due to chronic blood loss Lab Results  Component Value Date   HGB 12.4 12/02/2022   TIBC 375 12/02/2022   IRONPCTSAT 18 12/02/2022   FERRITIN 26 12/02/2022    S/p Venofer treatments. Hb has improved. Observation.    Hypomagnesemia #Chronic hypomagnesemia proceed with  IV magnesium today.  Continue weekly magnesium +/- IV magnesium.     Stem cell transplant candidate -[facility]- Post transplant RN coordinator [phone]).  Diarrhea chronic diarrhea,antidiarrhea PRN.  Recommend patient to follow-up with gastroenterology.   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   Previously on Lyrica and nortriptyline-not effective Neurology recommend Tramadol and may consider higher dose of  Nortriptyline in the future.  Follows up with neurology.    Orders Placed This Encounter  Procedures   CMP (Cancer Center only)   Magnesium   CBC with Differential (Cancer Center Only)    Standing Status:   Future    Standing Expiration Date:    12/02/2023   CMP (Cancer Center only)    Standing Status:   Future    Standing Expiration Date:   12/02/2023   Multiple Myeloma Panel (SPEP&IFE w/QIG)    Standing Status:   Future    Standing Expiration Date:   12/02/2023   Kappa/lambda light chains    Standing Status:   Future    Standing Expiration Date:   12/02/2023   Magnesium    Standing Status:   Future    Standing Expiration Date:   12/02/2023   Vitamin B12    Standing Status:   Future    Standing Expiration Date:   12/02/2023    Follow up  Lab Mag weekly x5  + IV mag.  6 weeks flex lab MD IV mag,  same labs  All questions were answered. The patient knows to call the clinic with any problems, questions or concerns.  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   Initial bone marrow revealed 80% plasma cells.  Lambda free light chains were 1340.  She had nephrotic range proteinuria.  She initially underwent induction with RVD.  Revlimid maintenance was discontinued on 01/21/2017 secondary to  intolerance.     07/19/2019 Bone marrow aspirate and biopsy on  revealed a normocellular marrow with but increased lambda-restricted plasma cells (9% aspirate, 40% CD138 immunohistochemistry).  Findings were consistent with recurrent plasma cell myeloma.  Flow cytometry revealed no monoclonal B-cell or phenotypically aberrant T-cell population. Cytogenetics were 34, XX (normal).  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    Lambda light chains have been followed: 22.2 (ratio 0.56) on 07/03/2017, 30.8 (ratio 0.78) on 09/02/2017, 36.9 (ratio 0.40) on 10/21/2017, 37.4 (ratio 0.41) on 12/16/2017, 70.7(ratio 0.31)  on 02/17/2018, 64.2 (ratio 0.27) on 04/07/2018, 78.9 (ratio 0.18) on 05/26/2018, 128.8 (ratio 0.17) on 08/06/2018, 181.5 (ratio 0.13) on 10/08/2018, 130.9 (ratio 0.13) on 10/20/2018, 160.7 (ratio 0.10) on 12/09/2018, 236.6 (ratio 0.07) on 02/01/2019, 363.6 (ratio 0.04) on 03/22/2019, 404.8 (ratio 0.04) on 04/05/2019, 420.7 (ratio 0.03) on 05/24/2019, 573.4 (ratio 0.03) on 06/23/2019, 451.05 (ratio 0.02) on 08/20/2019, 47.2 (ratio 0.13) on 10/25/2019, 22.4 (ratio 0.21) on 11/25/2019, 16.5 (ratio 0.33) on 01/10/2020, 14.6 (ratio 0.34) on 02/07/2020, 13.1 (ratio 0.31) on 03/07/2020, 10.1 (ratio 0.38) on 04/10/2020, and 9.5 (ratio 0.21) on 06/19/2020.   24 hour UPEP on 06/03/2019 revealed kappa free light chains 95.76, lambda free light chains 1,260.71, and ratio 0.08.  24 hour UPEP on 08/23/2019 revealed total protein of 782 mg/24 hrs with lambda free light chains 1,084.16 mg/L and ratio of 0.10 (1.03-31.76).  M spike in urine was 46.1% (361 mg/24 hrs).    Bone survey on 04/08/2016 and 05/28/2017 revealed no definite lytic lesion seen in the visualized skeleton.  Bone survey on 11/19/2018 revealed no suspicious lucent lesions and no acute bony abnormality.  PET scan on 07/12/2019 revealed no focal metabolic activity to suggest active myeloma within the skeleton. There were no lytic lesions identified on the CT portion of the exam or soft tissue plasmacytomas. There was no evidence of multiple myeloma.    Pretreatment RBC phenotype on 09/23/2019 was positive for C, e, DUFFY B, KIDD B, M, S, and s antigen; negative for c, E, KELL, DUFFY A, KIDD A, and N antigen.    09/27/2019 - 10/25/2019; 12/09/2019 - 03/13/2020 6 cycles of daratumumab and hyaluronidase-fihj, Pomalyst, and Decadron (DPd) .  Cycle #1 was  complicated by fever and neutropenia requiring admission.  Cycle #2 was complicated by pneumonia requiring admission.  Cycle #6 was complicated with an ER evaluation for an elevated lactic acid.   05/10/2020 second autologous stem cell transplant at [facility]   She underwent conditioning with melphalan 140 mg/m2.    She is s/p week #3 Velcade maintenance (began 08/31/2020 - 09/28/2020).  She has no increase in neuropathy.  She has severe aortic stenosis.  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 Cardiology is following for a possible TAVR in the future.  Echo on 07/05/2020 revealed moderate to severe aortic stenosis with an EF of 50-55%.  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 Zometa was discontinued in 01/2017.  She has chronic nausea on Phenergan.     B12 deficiency.  B12 was 254 on 04/09/2017, 295 on 08/20/2018, and 391 on 10/08/2018.  She was on oral B12.  She received B12 monthly (last 09/14/2020).  Folate was 12.1 on 01/10/2020.    She has iron deficiency.  Ferritin was 32 on 07/01/2019.  She received Venofer on 07/15/2019 and 07/22/2019.   She has hypogammaglobulinemia.  IgG was 245  on 11/25/2019.  She received monthly IVIG (07/22/021 - 03/22/2020).  She received IVIG 400 mg/kg on 01/20/2020, 200 mg/kg on 02/17/2020, and 300 mg/kg on 03/22/2020.  IVIG on 01/20/2020 was complicated by acute renal failure. IgG trough level was 418 on 02/17/2020.  10/15/2019 - 10/20/2019 She was admitted to [facility] with fever and neutropenia.  Cultures were negative.  CXR was negative. She received broad spectrum antibiotics and daily Granix.  She received IVF for acute renal insufficiency due to diarrhea and dehydration.  Creatine was 1.66 on admission and 1.12 on discharge.    03/01/2020 - 03/05/2020 admitted to [facility] from with fever and neutropenia. CXR revealed no active cardiopulmonary disease. Chest CT with  contrast revealed no acute intrathoracic pathology. There were findings which could be suggestive of prior granulomatous disease. She was treated with Cefepime and Vancomycin, then switched to ciprofloxacin on 03/03/2020.   05/08/2020 - 12/05/2021The patient was admitted to [facility] from  for autologous bone marrow transplant.   She received conditioning with melphalan 140 mg/m2.  Bone marrow transplant was on 05/10/2020. The patient developed fevers daily from 05/19/2020 - 05/24/2020. Blood cultures were + for strept sanguis bacteremia.  She was treated cefepime, vancomycin, and solumedrol for possible engraftment syndrome. Cefepime was switched to ceftriaxone; she completed 2 weeks of ceftriaxone on 06/03/2020. She had chemotherapy induced diarrhea.  She experienced urinary retention.  Feb 2022 patient has been on maintenance Velcade 2 mg every 2 weeks.  Chronic diarrhea seen by gastroenterology Dr. Tobi Bastos and was recommended to avoid artificial sugars in her diet including Diet Coke and coffee mate.  She feels some improvement of her diarrhea frequency since the dietary modification.  GI profile PCR negative, C. difficile toxin A+ B negative, fecal calprotectin 77 12/19/2020 colonoscopy showed 2 subcentimeter polyps in the ascending colon, resected and removed.  Pathology showed tubular adenoma.  No malignancy.  Normal mucosa in the entire examined colon.  Biopsied, pathology showed colonic mucosa with no significant pathological alteration.  Negative for active inflammation and features of chronicity.  Negative for microscopic colitis, dysplasia, and malignanc  Diabetes, patient has discontinued metformin due to diarrhea and started on glipizide 2.5 mg   03/05/2021-03/09/2021 Patient was hospitalized due to fever and chills, nonproductive cough, shortness of breath. X-ray showed right lower lobe atelectasis versus pneumonia.  Blood cultures negative.  Urine culture showed 60,000 colonies of Enterococcus  facialis.  Patient received antibiotics. Patient also had abdominal pelvis CT scan for work-up of intermittent lower abdomen pain.No acute abnormality.   05/14/21 last dose of Velcade maintenance.  establish care with neurology Dr. Theora Master and her neuropathy regimen has been switched from gabapentin to Lyrica. 05/29/2021 patient got influenza   neuropathy medication has been switched from gabapentin/Lyrica to nortriptyline.  08/18/2021 Hospitalized due to pneumonia from metapnuemovirus, hospitalization was complicated with NSTEMI. She received heparin gtt.  Treated with antibiotics for coverage of pneumonia,  diuretics for pulmonary edema. She received IVIG 400mg /kg x 1 for hypoglobulinemia. Discharged on 08/25/21 with Augmentin and Zithromax.   May 2023, Daratumumab being held for [facility] infectious disease physician Dr. Kennedy Bucker due to frequent infection.  Diagnosed with CHF [HFrEF] -NYHA class II-III, she follows up with cardiology, started on entresto  hospitalization due to CHF exacerbation, community acquired pneumonia.   Presented to emergency room on 07/01/2022 Respiratory panel RT-PCR negative for COVID-19, influenza and RSV.   INTERVAL HISTORY Theo Pollins is a 67 y.o. female who has above history reviewed by me today presents for follow up  visit for management of multiple myeloma.  Patient has been on weekly magnesium infusion as needed hypomagnesemia + CHF [nonischemic cardiomyopathy], aortic stenosis, follows up with cardiology.  Patient has no new complaints today.  Denies any infections since last visit.  Past Medical History:  Diagnosis Date   Anemia    Anxiety    Aortic stenosis    a. 05/2020 Echo: EF 45-50%, sev AS - seen by TAVR team @ [facility] - CTA sugg of tricuspid valve w/ fusing of 2 leaflets-TAVR deferred in setting of acute infxn; b. 07/2020 Echo: EF 50-55%, mod-sev AS; c. 12/2020 Echo: EF>55%. Mod-sev paradoxical low-flow low-gradient AS; d. 08/2021 Echo: EF  35-40%, severe AS, triv AI, mild MR.   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 Variable EF over time; b. 08/2017 Echo: EF 40%; b. 03/2020 Echo: EF 55-60%; c. 05/2020 Echo: EF 45-50%; d. 07/2020 Echo: EF 50-55%; e. 12/2020 Echo: EF>55%; f. 08/2021 Echo: EF 35-40%.   Chronic heart failure with preserved ejection fraction (HFpEF) (HCC)    a. Variable EF over time; b. 08/2017 Echo: EF 40%; b. 03/2020 Echo: EF 55-60%; c. 05/2020 Echo: EF 45-50%; d. 07/2020 Echo: EF 50-55%; e. 12/2020 Echo: EF>55%; f. 08/2021 Echo: EF 35-40%, no RV, mildly dil LA, mild MR, triv AI, severe AS.   CKD (chronic kidney disease) stage 3, GFR 30-59 ml/min (HCC)    Depression    Diabetes mellitus (HCC)    Dizziness    Fatty liver    Frequent falls    GERD (gastroesophageal reflux disease)    Gout    Heart murmur    History of blood transfusion    History of bone marrow transplant (HCC)    History of uterine fibroid    Hx of cardiac catheterization    a. 01/2016 [facility] - after abnl nuc): Nl cors.   Hypertension    Hypomagnesemia    IDA (iron deficiency anemia)    Multiple myeloma (HCC)    Personal history of chemotherapy    PSVT (paroxysmal supraventricular tachycardia)    a. S/p RFCA [facility]).   PVC's (premature ventricular contractions)    a. Well-managed w/ bisoprolol in outpt setting.   Renal cyst     Past Surgical History:  Procedure Laterality Date   ABDOMINAL HYSTERECTOMY     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   CARDIAC ELECTROPHYSIOLOGY MAPPING AND ABLATION     CARPAL TUNNEL RELEASE Bilateral    CHOLECYSTECTOMY  2008   COLONOSCOPY WITH PROPOFOL N/A 05/07/2017   Procedure: COLONOSCOPY WITH PROPOFOL;  Surgeon: Wyline Mood, MD;  Location: [facility] ENDOSCOPY;  Service: Gastroenterology;  Laterality: N/A;   COLONOSCOPY WITH PROPOFOL N/A 12/19/2020   Procedure: COLONOSCOPY WITH  PROPOFOL;  Surgeon: Wyline Mood, MD;  Location: [facility] ENDOSCOPY;  Service: Gastroenterology;  Laterality: N/A;   ESOPHAGOGASTRODUODENOSCOPY (EGD) WITH PROPOFOL N/A 05/07/2017   Procedure: ESOPHAGOGASTRODUODENOSCOPY (EGD) WITH PROPOFOL;  Surgeon: Wyline Mood, MD;  Location: [facility] ENDOSCOPY;  Service: Gastroenterology;  Laterality: N/A;   FOOT SURGERY Bilateral    INCONTINENCE SURGERY  2009   INTERSTIM IMPLANT PLACEMENT     other     over active bladder   OTHER SURGICAL HISTORY     bladder stimulator    PARTIAL HYSTERECTOMY  03/1996   fibroids   PORTA CATH INSERTION N/A 03/10/2019   Procedure: PORTA CATH INSERTION;  Surgeon: Annice Needy, MD;  Location: [facility] INVASIVE CV LAB;  Service: Cardiovascular;  Laterality: N/A;   TONSILLECTOMY  2007    Family History  Problem Relation Age of Onset   Colon cancer Father    Renal Disease Father    Diabetes Mellitus II Father    Melanoma Paternal Grandmother    Breast cancer Maternal Aunt 74   Anemia Mother    Heart disease Mother    Heart failure Mother    Renal Disease Mother    Congestive Heart Failure Mother    Heart disease Maternal Uncle    Throat cancer Maternal Uncle    Lung cancer Maternal Uncle    Liver disease Maternal Uncle    Heart failure Maternal Uncle    Hearing loss Son 29       Suicide     Social History:  reports that she quit smoking about 31 years ago. Her smoking use included cigarettes. She has a 20.00 pack-year smoking history. She has never used smokeless tobacco. She reports current alcohol use of about 2.0 standard drinks of alcohol per week. She reports that she does not use drugs.  She is on disability. She notes exposure to perchloroethylene [facility]).   Allergies:  Allergies  Allergen Reactions   Oxycodone-Acetaminophen Anaphylaxis    Swelling and rash   Celebrex [Celecoxib] Diarrhea   Codeine    Plerixafor     In 2016 during ASCT collection patient developed fever to 103.8F and required hospitalization    Benadryl [Diphenhydramine] Palpitations   Morphine Itching and Rash   Ondansetron Diarrhea   Tylenol [Acetaminophen] Itching and Rash    Current Medications: Current Outpatient Medications  Medication Sig Dispense Refill   allopurinol (ZYLOPRIM) 100 MG tablet TAKE 1 TABLET(100 MG) BY MOUTH DAILY as needed 90 tablet 1   amiodarone (PACERONE) 200 MG tablet Take 200 mg by mouth daily. Take 2 tabs daily for 3 weeks and then 1 tab daily     amoxicillin-clavulanate (AUGMENTIN) 875-125 MG tablet Take 1 tablet by mouth 2 (two) times daily. 14 tablet 0   atorvastatin (LIPITOR) 10 MG tablet Take 0.5 tablets (5 mg total) by mouth daily. 15 tablet 11   bisoprolol (ZEBETA) 10 MG tablet Take 1 tablet (10 mg total) by mouth daily. 90 tablet 1   calcium carbonate (OS-CAL) 1250 (500 Ca) MG chewable tablet Chew 2 tablets by mouth daily.     clotrimazole (CLOTRIMAZOLE 3) 2 % vaginal cream Place 1 Applicatorful vaginally at bedtime. 21 g 0   diclofenac sodium (VOLTAREN) 1 % GEL Apply 2 g topically 4 (four) times daily. 100 g 1   diphenoxylate-atropine (LOMOTIL) 2.5-0.025 MG tablet Take 2 tablets by mouth 4 (four) times daily as needed for diarrhea or loose stools. 180 tablet 3   empagliflozin (JARDIANCE) 10 MG TABS tablet Take 1 tablet by mouth daily.     ENTRESTO 24-26 MG Take 1 tablet by mouth 2 (two) times daily.     furosemide (LASIX) 40 MG tablet Take 0.5 tablets (20 mg total) by mouth daily. Home dose.     glipiZIDE (GLUCOTROL XL) 2.5 MG 24 hr tablet Take 1 tablet (2.5 mg total) by mouth daily with breakfast. 30 tablet 0   glucose blood test strip      lidocaine (XYLOCAINE) 2 % solution Use as directed 15 mLs in the mouth or throat every 3 (three) hours as needed for mouth pain (swish and spit). 100 mL 0   magnesium chloride (SLOW-MAG)  64 MG TBEC SR tablet Take 1 tablet (64 mg total) by mouth daily. 60 tablet 1   Multiple Vitamin (MULTIVITAMIN) tablet Take 1 tablet by mouth daily.     omeprazole  (PRILOSEC) 40 MG capsule TAKE 1 CAPSULE IN THE MORNING AND TAKE 1 CAPSULE AT BEDTIME 180 capsule 1   pentoxifylline (TRENTAL) 400 MG CR tablet Take 400 mg by mouth 3 (three) times daily with meals.     promethazine (PHENERGAN) 25 MG tablet Take 1 tablet (25 mg total) by mouth every 6 (six) hours as needed for nausea or vomiting. 90 tablet 1   spironolactone (ALDACTONE) 25 MG tablet Take by mouth.     tiZANidine (ZANAFLEX) 4 MG tablet Take 1 tablet (4 mg total) by mouth every 6 (six) hours as needed for muscle spasms. 90 tablet 0   traMADol (ULTRAM) 50 MG tablet      traZODone (DESYREL) 100 MG tablet TAKE 1 TABLET AT BEDTIME 90 tablet 3   venlafaxine (EFFEXOR) 75 MG tablet Take 75 mg by mouth daily.     vitamin E 45 MG (100 UNITS) capsule Take by mouth daily.     montelukast (SINGULAIR) 10 MG tablet Take 1 tablet (10 mg total) by mouth See admin instructions. Take 1 tab daily for 2 days after daratumumab treatments (Patient not taking: Reported on 10/21/2022) 30 tablet 1   potassium chloride SA (KLOR-CON M) 20 MEQ tablet Take 1 tablet (20 mEq total) by mouth daily. Home med, reduced from twice daily to daily. (Patient not taking: Reported on 10/21/2022)  0   promethazine-dextromethorphan (PROMETHAZINE-DM) 6.25-15 MG/5ML syrup Take 5 mLs by mouth 4 (four) times daily as needed for cough. (Patient not taking: Reported on 10/17/2022) 118 mL 0   No current facility-administered medications for this visit.   Facility-Administered Medications Ordered in Other Visits  Medication Dose Route Frequency Provider Last Rate Last Admin   sodium chloride flush (NS) 0.9 % injection 10 mL  10 mL Intravenous PRN Rickard Patience, MD   10 mL at 04/02/21 1610    Review of Systems  Constitutional:  Negative for chills, fever, malaise/fatigue and weight loss.  HENT:  Negative for sore throat.   Eyes:  Negative for redness.  Respiratory:  Negative for cough, shortness of breath and wheezing.   Cardiovascular:  Negative for  chest pain, palpitations and leg swelling.  Gastrointestinal:  Positive for diarrhea. Negative for abdominal pain, blood in stool, nausea and vomiting.  Genitourinary:  Negative for dysuria.  Musculoskeletal:  Positive for back pain and joint pain. Negative for myalgias.  Skin:  Negative for rash.  Neurological:  Positive for tingling and sensory change. Negative for dizziness and tremors.  Endo/Heme/Allergies:  Does not bruise/bleed easily.  Psychiatric/Behavioral:  Negative for hallucinations.     Performance status (ECOG): 1  Vitals Blood pressure 139/73, pulse 61, temperature (!) 96.9 F (36.1 C), temperature source Tympanic, resp. rate 18, weight 155 lb 14.4 oz (70.7 kg), SpO2 97 %.  Physical Exam Constitutional:      General: She is not in acute distress.    Appearance: She is not diaphoretic.  HENT:     Head: Normocephalic and atraumatic.  Eyes:     General: No scleral icterus.    Pupils: Pupils are equal, round, and reactive to light.  Cardiovascular:     Rate and Rhythm: Normal rate.     Heart sounds: Murmur heard.  Pulmonary:     Effort: Pulmonary effort is normal. No respiratory distress.  Breath sounds: No rales.  Chest:     Chest wall: No tenderness.  Abdominal:     General: There is no distension.  Musculoskeletal:        General: Normal range of motion.     Cervical back: Normal range of motion and neck supple.  Skin:    General: Skin is warm and dry.     Findings: No erythema.     Comments: Lower extremity cat bite cellulitis has improved.   Neurological:     Mental Status: She is alert and oriented to person, place, and time.     Cranial Nerves: No cranial nerve deficit.     Motor: No abnormal muscle tone.     Coordination: Coordination normal.  Psychiatric:        Mood and Affect: Mood and affect normal.      Laboratory data    Latest Ref Rng & Units 12/02/2022    8:41 AM 10/21/2022    9:29 AM 09/30/2022    9:15 AM  CBC  WBC 4.0 - 10.5 K/uL  5.2  6.5  6.1   Hemoglobin 12.0 - 15.0 g/dL 16.1  09.6  04.5   Hematocrit 36.0 - 46.0 % 37.1  37.8  35.4   Platelets 150 - 400 K/uL 126  127  120       Latest Ref Rng & Units 12/02/2022    8:41 AM 10/21/2022    9:29 AM 09/30/2022    9:15 AM  CMP  Glucose 70 - 99 mg/dL 409  811  914   BUN 8 - 23 mg/dL 35  34  22   Creatinine 0.44 - 1.00 mg/dL 7.82  9.56  2.13   Sodium 135 - 145 mmol/L 137  136  136   Potassium 3.5 - 5.1 mmol/L 4.0  3.9  4.0   Chloride 98 - 111 mmol/L 108  106  106   CO2 22 - 32 mmol/L 23  21  23    Calcium 8.9 - 10.3 mg/dL 8.5  8.4  8.2   Total Protein 6.5 - 8.1 g/dL 7.4  7.3  7.1   Total Bilirubin 0.3 - 1.2 mg/dL 0.3  0.3  0.3   Alkaline Phos 38 - 126 U/L 85  85  88   AST 15 - 41 U/L 23  24  28    ALT 0 - 44 U/L 20  22  26

## 2022-12-02 NOTE — Assessment & Plan Note (Signed)
chronic diarrhea,antidiarrhea PRN.  Recommend patient to follow-up with gastroenterology.  

## 2022-12-02 NOTE — Assessment & Plan Note (Signed)
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   Previously on Lyrica and nortriptyline-not effective Neurology recommend Tramadol and may consider higher dose of  Nortriptyline in the future. Follows up with neurology. 

## 2022-12-02 NOTE — Assessment & Plan Note (Signed)
#  Chronic hypomagnesemia proceed with  IV magnesium today.  Continue weekly magnesium +/- IV magnesium.    

## 2022-12-02 NOTE — Assessment & Plan Note (Signed)
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  Light chain ratio is normal. 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 Repeat immunofixation negative. . Labs reviewed and discussed with patient, stable M protein and stable light chain ratio Today's level is pending.   Daratumumab on hold due to frequent infections despite IVIG Continue acyclovir prophylaxis.   

## 2022-12-02 NOTE — Assessment & Plan Note (Addendum)
Lab Results  Component Value Date   HGB 12.4 12/02/2022   TIBC 375 12/02/2022   IRONPCTSAT 18 12/02/2022   FERRITIN 26 12/02/2022    S/p Venofer treatments. Hb has improved. Observation.

## 2022-12-03 ENCOUNTER — Encounter: Payer: Medicare PPO | Attending: Family Medicine | Admitting: *Deleted

## 2022-12-03 DIAGNOSIS — C9000 Multiple myeloma not having achieved remission: Secondary | ICD-10-CM | POA: Insufficient documentation

## 2022-12-03 LAB — KAPPA/LAMBDA LIGHT CHAINS
Kappa free light chain: 21.5 mg/L — ABNORMAL HIGH (ref 3.3–19.4)
Kappa, lambda light chain ratio: 1.01 (ref 0.26–1.65)
Lambda free light chains: 21.3 mg/L (ref 5.7–26.3)

## 2022-12-03 NOTE — Progress Notes (Signed)
Daily Session Note  Patient Details  Name: Sarah [name] MRN: [id number] Date of Birth: 1956-05-21 Referring Provider:   Flowsheet Row Cardiac Rehab from 08/29/2022 in [facility] Cardiac and Pulmonary Rehab  Referring Provider Joneen Roach, MD       Encounter Date: 12/03/2022  Check In:  Session Check In - 12/03/22 1245       Check-In   Supervising physician immediately available to respond to emergencies See telemetry face sheet for immediately available ER MD    Location [facility]-Cardiac & Pulmonary Rehab    Staff Present Ronette Deter, BS, Exercise Physiologist;Rindy Kollman Juanetta Gosling, MA, RCEP, CCRP, CCET    Virtual Visit No    Medication changes reported     No    Fall or balance concerns reported    No    Warm-up and Cool-down Performed on first and last piece of equipment    Resistance Training Performed Yes    VAD Patient? No    PAD/SET Patient? No      Pain Assessment   Currently in Pain? No/denies                Social History   Tobacco Use  Smoking Status Former   Packs/day: 1.00   Years: 20.00   Additional pack years: 0.00   Total pack years: 20.00   Types: Cigarettes   Quit date: 07/02/1991   Years since quitting: 31.4  Smokeless Tobacco Never    Goals Met:  Proper associated with RPD/PD & O2 Sat Independence with exercise equipment Exercise tolerated well No report of concerns or symptoms today Strength training completed today  Goals Unmet:  Not Applicable  Comments: Pt able to follow exercise prescription today without complaint.  Will continue to monitor for progression.    Dr. Bethann Punches is Medical Director for [facility] Cardiac Rehabilitation.  Dr. Vida Rigger is Medical Director for [facility] Pulmonary Rehabilitation.

## 2022-12-04 ENCOUNTER — Ambulatory Visit
Admission: RE | Admit: 2022-12-04 | Discharge: 2022-12-04 | Disposition: A | Discharge status: Home / Self Care | Payer: Medicare PPO | Source: Ambulatory Visit | Attending: Internal Medicine | Admitting: Internal Medicine

## 2022-12-04 ENCOUNTER — Encounter: Payer: Self-pay | Admitting: Oncology

## 2022-12-04 DIAGNOSIS — Z1231 Encounter for screening mammogram for malignant neoplasm of breast: Secondary | ICD-10-CM | POA: Insufficient documentation

## 2022-12-09 ENCOUNTER — Other Ambulatory Visit: Payer: Self-pay | Admitting: *Deleted

## 2022-12-09 ENCOUNTER — Encounter: Payer: Self-pay | Admitting: Internal Medicine

## 2022-12-09 ENCOUNTER — Ambulatory Visit (INDEPENDENT_AMBULATORY_CARE_PROVIDER_SITE_OTHER): Payer: Medicare PPO | Admitting: Internal Medicine

## 2022-12-09 VITALS — BP 98/60 | HR 69 | Ht 63.0 in | Wt 152.0 lb

## 2022-12-09 DIAGNOSIS — K219 Gastro-esophageal reflux disease without esophagitis: Secondary | ICD-10-CM

## 2022-12-09 DIAGNOSIS — E119 Type 2 diabetes mellitus without complications: Secondary | ICD-10-CM | POA: Insufficient documentation

## 2022-12-09 DIAGNOSIS — F324 Major depressive disorder, single episode, in partial remission: Secondary | ICD-10-CM

## 2022-12-09 DIAGNOSIS — E118 Type 2 diabetes mellitus with unspecified complications: Secondary | ICD-10-CM | POA: Diagnosis not present

## 2022-12-09 DIAGNOSIS — C9001 Multiple myeloma in remission: Secondary | ICD-10-CM

## 2022-12-09 DIAGNOSIS — G620 Drug-induced polyneuropathy: Secondary | ICD-10-CM

## 2022-12-09 DIAGNOSIS — I10 Essential (primary) hypertension: Secondary | ICD-10-CM

## 2022-12-09 DIAGNOSIS — E559 Vitamin D deficiency, unspecified: Secondary | ICD-10-CM | POA: Diagnosis not present

## 2022-12-09 DIAGNOSIS — Z7984 Long term (current) use of oral hypoglycemic drugs: Secondary | ICD-10-CM

## 2022-12-09 DIAGNOSIS — E785 Hyperlipidemia, unspecified: Secondary | ICD-10-CM

## 2022-12-09 DIAGNOSIS — Z0000 Encounter for general adult medical examination without abnormal findings: Secondary | ICD-10-CM | POA: Diagnosis not present

## 2022-12-09 DIAGNOSIS — E1169 Type 2 diabetes mellitus with other specified complication: Secondary | ICD-10-CM

## 2022-12-09 DIAGNOSIS — N1832 Chronic kidney disease, stage 3b: Secondary | ICD-10-CM | POA: Diagnosis not present

## 2022-12-09 DIAGNOSIS — F5101 Primary insomnia: Secondary | ICD-10-CM

## 2022-12-09 LAB — MULTIPLE MYELOMA PANEL, SERUM
Albumin SerPl Elph-Mcnc: 3.7 g/dL (ref 2.9–4.4)
Albumin/Glob SerPl: 1.3 (ref 0.7–1.7)
Alpha 1: 0.2 g/dL (ref 0.0–0.4)
Alpha2 Glob SerPl Elph-Mcnc: 0.9 g/dL (ref 0.4–1.0)
B-Globulin SerPl Elph-Mcnc: 1 g/dL (ref 0.7–1.3)
Gamma Glob SerPl Elph-Mcnc: 0.8 g/dL (ref 0.4–1.8)
Globulin, Total: 2.9 g/dL (ref 2.2–3.9)
IgA: 233 mg/dL (ref 87–352)
IgG (Immunoglobin G), Serum: 813 mg/dL (ref 586–1602)
IgM (Immunoglobulin M), Srm: 34 mg/dL (ref 26–217)
Total Protein ELP: 6.6 g/dL (ref 6.0–8.5)

## 2022-12-09 MED ORDER — BISOPROLOL FUMARATE 10 MG PO TABS: 10.0000 mg | ORAL_TABLET | Freq: Every day | ORAL | 1 refills | Status: DC

## 2022-12-09 MED ORDER — ATORVASTATIN CALCIUM 10 MG PO TABS
5.0000 mg | ORAL_TABLET | Freq: Every day | ORAL | 1 refills | Status: DC
Start: 2022-12-09 — End: 2023-10-24

## 2022-12-09 MED ORDER — GLIPIZIDE ER 2.5 MG PO TB24: 2.5000 mg | ORAL_TABLET | Freq: Every day | ORAL | 1 refills | Status: DC

## 2022-12-09 NOTE — Assessment & Plan Note (Signed)
Reflux symptoms are minimal on current therapy - omeprazole. No red flag signs such as weight loss, n/v, melena  

## 2022-12-09 NOTE — Assessment & Plan Note (Signed)
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  Light chain ratio is normal. 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 Repeat immunofixation negative. . Labs reviewed and discussed with patient, stable M protein and stable light chain ratio Today's level is pending.   Daratumumab on hold due to frequent infections despite IVIG Continue acyclovir prophylaxis.   

## 2022-12-09 NOTE — Assessment & Plan Note (Signed)
Clinically stable on current regimen with fairly good control of symptoms, No SI or HI. On low dose Effexor

## 2022-12-09 NOTE — Assessment & Plan Note (Signed)
Stable exam with well controlled BP.  Currently taking bisoprolol and lasix. Tolerating medications without concerns or side effects.

## 2022-12-09 NOTE — Progress Notes (Addendum)
Date:  12/09/2022   Name:  Sarah [name]   DOB:  Nov 11, 1955   MRN:  [id number]   Chief Complaint: Annual Exam Sarah [name] is a 67 y.o. female who presents today for her Complete Annual Exam. She feels well. She reports exercising. She reports she is sleeping poorly. Breast complaints - none.  Mammogram: 11/2022 DEXA: none Colonoscopy: 11/2020 repeat 7 yrs  Health Maintenance Due  Topic Date Due   OPHTHALMOLOGY EXAM  07/26/2020   DEXA SCAN  Never done   COVID-19 Vaccine (5 - 2023-24 season) 09/06/2022   Diabetic kidney evaluation - Urine ACR  12/08/2022    Immunization History  Administered Date(s) Administered   DTaP / Hep B / IPV 11/14/2020, 07/24/2021, 01/22/2022   Fluad Quad(high Dose 65+) 03/13/2021   HIB (PRP-T) 07/09/2016, 09/10/2016, 07/08/2017, 11/14/2020, 07/24/2021, 01/22/2022   Hepatitis B, ADULT 07/09/2016, 09/10/2016, 07/08/2017   IPV 07/09/2016, 09/10/2016, 07/08/2017   Influenza,inj,Quad PF,6+ Mos 03/23/2015, 06/18/2016, 04/08/2017, 06/30/2017, 03/31/2018, 04/12/2019, 04/03/2020   Influenza-Unspecified 07/12/2022   MMR 07/08/2017, 05/28/2022   PFIZER(Purple Top)SARS-COV-2 Vaccination 09/20/2019, 10/09/2019, 01/09/2021, 07/12/2022   PNEUMOCOCCAL CONJUGATE-20 07/24/2021, 01/22/2022, 05/28/2022   Pneumococcal Conjugate-13 03/23/2015, 07/09/2016, 01/07/2017, 03/11/2017, 11/14/2020   Pneumococcal Polysaccharide-23 07/08/2017   Rabies, IM 10/15/2022, 10/18/2022, 10/22/2022, 10/29/2022   Td 08/20/2020   Tdap 07/09/2016, 09/10/2016, 02/12/2018   Zoster Recombinat (Shingrix) 11/19/2017, 02/12/2018    Insomnia Primary symptoms: sleep disturbance, difficulty falling asleep, premature morning awakening.   The onset quality is undetermined. The problem occurs nightly. The problem is unchanged.  Diabetes She presents for her follow-up diabetic visit. She has type 2 diabetes mellitus. Her disease course has been stable. Pertinent negatives for hypoglycemia include  no dizziness, headaches, nervousness/anxiousness or tremors. Associated symptoms include fatigue. Pertinent negatives for diabetes include no chest pain, no polydipsia and no polyuria.  Hypertension This is a chronic problem. The problem is controlled. Associated symptoms include shortness of breath. Pertinent negatives include no chest pain, headaches or palpitations. Past treatments include angiotensin blockers and ACE inhibitors.    Lab Results  Component Value Date   NA 137 12/02/2022   K 4.0 12/02/2022   CO2 23 12/02/2022   GLUCOSE 143 (H) 12/02/2022   BUN 35 (H) 12/02/2022   CREATININE 1.42 (H) 12/02/2022   CALCIUM 8.5 (L) 12/02/2022   GFRNONAA 41 (L) 12/02/2022   Lab Results  Component Value Date   CHOL 175 02/05/2022   HDL 67 02/05/2022   LDLCALC 91 02/05/2022   TRIG 87 02/05/2022   CHOLHDL 2.6 02/05/2022   Lab Results  Component Value Date   TSH 1.450 07/22/2022   Lab Results  Component Value Date   HGBA1C 6.6 (H) 06/12/2022   Lab Results  Component Value Date   WBC 5.2 12/02/2022   HGB 12.4 12/02/2022   HCT 37.1 12/02/2022   MCV 85.7 12/02/2022   PLT 126 (L) 12/02/2022   Lab Results  Component Value Date   ALT 20 12/02/2022   AST 23 12/02/2022   ALKPHOS 85 12/02/2022   BILITOT 0.3 12/02/2022   Lab Results  Component Value Date   VD25OH 26.1 (L) 12/07/2021     Review of Systems  Constitutional:  Positive for fatigue. Negative for chills and fever.  HENT:  Negative for congestion, hearing loss, tinnitus, trouble swallowing and voice change.   Eyes:  Negative for visual disturbance.  Respiratory:  Positive for shortness of breath. Negative for cough, chest tightness and wheezing.   Cardiovascular:  Negative for chest  pain, palpitations and leg swelling.  Gastrointestinal:  Negative for abdominal pain, constipation, diarrhea and vomiting.  Endocrine: Negative for polydipsia and polyuria.  Genitourinary:  Negative for dysuria, frequency, genital sores,  vaginal bleeding and vaginal discharge.  Musculoskeletal:  Negative for arthralgias, gait problem and joint swelling.  Skin:  Negative for color change and rash.  Neurological:  Negative for dizziness, tremors, light-headedness and headaches.  Hematological:  Negative for adenopathy. Does not bruise/bleed easily.  Psychiatric/Behavioral:  Positive for sleep disturbance. Negative for dysphoric mood. The patient has insomnia. The patient is not nervous/anxious.     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   Overweight (BMI 25.0-29.9) 02/04/2022   Complete left bundle branch block 09/06/2021   Anemia of chronic renal failure, stage 3a (HCC) 09/06/2021   Gastroesophageal reflux disease without esophagitis 09/06/2021   Chronic systolic CHF (congestive heart failure) (HCC) 08/22/2021   Low immunoglobulin level    Chronic diarrhea 06/23/2021   Multiple myeloma in remission (HCC) 05/14/2021   Neuropathy 04/02/2021   Frequent PVCs 12/04/2020   Coronary artery disease involving native coronary artery of native heart 04/26/2020   Hypokalemia 03/11/2020   Dyspnea 03/02/2020   Physical deconditioning 02/07/2020   Impaired nutrition 02/07/2020   S/P autologous bone marrow transplantation (HCC) 09/10/2019   Hypocalcemia 08/28/2019   Chronic nausea 12/17/2018   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   Hyperlipidemia associated with type 2 diabetes mellitus (HCC) 11/25/2018   Hypomagnesemia 08/20/2018   Goals of care, counseling/discussion 08/20/2018   B12 deficiency 08/20/2018   Thrombocytopenia (HCC) 02/09/2018   Benign essential HTN 08/21/2017   Carotid artery stenosis, asymptomatic, bilateral 08/06/2017   Thyroid nodule 08/06/2017   Iron  deficiency anemia due to chronic blood loss 05/13/2017   Spinal stenosis of lumbar region with neurogenic claudication 04/09/2017   Long term prescription opiate use 09/07/2016   Chronic pain syndrome 07/08/2016   Pain medication agreement signed 07/08/2016   Spondylosis of lumbar region without myelopathy or radiculopathy 07/08/2016   Nonrheumatic aortic valve stenosis 06/05/2016   Environmental and seasonal allergies 04/19/2016   Insomnia 04/19/2016   Asthma 04/19/2016   Aortic regurgitation 04/19/2016   Type II diabetes mellitus with complication (HCC) 04/12/2016   Depression, major, single episode, in partial remission (HCC) 04/12/2016   Irritable bowel syndrome (IBS) 04/12/2016   Hepatic steatosis 04/12/2016   Multiple myeloma in remission (HCC) 04/02/2016   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HCC) 07/06/2015   Stem cell transplant candidate 05/16/2015    Allergies  Allergen Reactions   Oxycodone-Acetaminophen Anaphylaxis    Swelling and rash   Celebrex [Celecoxib] Diarrhea   Codeine    Plerixafor     In 2016 during ASCT collection patient developed fever to 103.45F and required hospitalization   Benadryl [Diphenhydramine] Palpitations   Morphine Itching and Rash   Ondansetron Diarrhea   Tylenol [Acetaminophen] Itching and Rash    Past Surgical History:  Procedure Laterality Date   ABDOMINAL HYSTERECTOMY     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   CARDIAC ELECTROPHYSIOLOGY MAPPING AND ABLATION     CARPAL TUNNEL RELEASE Bilateral    CHOLECYSTECTOMY  2008   COLONOSCOPY WITH PROPOFOL N/A 05/07/2017   Procedure: COLONOSCOPY WITH PROPOFOL;  Surgeon: Wyline Mood, MD;  Location:  [facility] ENDOSCOPY;  Service: Gastroenterology;  Laterality: N/A;   COLONOSCOPY WITH PROPOFOL N/A 12/19/2020   Procedure: COLONOSCOPY WITH PROPOFOL;  Surgeon: Wyline Mood, MD;  Location: [facility] ENDOSCOPY;  Service: Gastroenterology;  Laterality: N/A;   ESOPHAGOGASTRODUODENOSCOPY (EGD) WITH PROPOFOL N/A  05/07/2017   Procedure: ESOPHAGOGASTRODUODENOSCOPY (EGD) WITH PROPOFOL;  Surgeon: Wyline Mood, MD;  Location: [facility] ENDOSCOPY;  Service: Gastroenterology;  Laterality: N/A;   FOOT SURGERY Bilateral    INCONTINENCE SURGERY  2009   INTERSTIM IMPLANT PLACEMENT     other     over active bladder   OTHER SURGICAL HISTORY     bladder stimulator    PARTIAL HYSTERECTOMY  03/1996   fibroids   PORTA CATH INSERTION N/A 03/10/2019   Procedure: PORTA CATH INSERTION;  Surgeon: Annice Needy, MD;  Location: [facility] INVASIVE CV LAB;  Service: Cardiovascular;  Laterality: N/A;   TONSILLECTOMY  2007    Social History   Tobacco Use   Smoking status: Former    Packs/day: 1.00    Years: 20.00    Additional pack years: 0.00    Total pack years: 20.00    Types: Cigarettes    Quit date: 07/02/1991    Years since quitting: 31.4   Smokeless tobacco: Never  Vaping Use   Vaping Use: Never used  Substance Use Topics   Alcohol use: Yes    Alcohol/week: 2.0 standard drinks of alcohol    Types: 2 Cans of beer per week   Drug use: No     Medication list has been reviewed and updated.  Current Meds  Medication Sig   allopurinol (ZYLOPRIM) 100 MG tablet TAKE 1 TABLET(100 MG) BY MOUTH DAILY as needed   amiodarone (PACERONE) 200 MG tablet Take 200 mg by mouth daily.   calcium carbonate (OS-CAL) 1250 (500 Ca) MG chewable tablet Chew 2 tablets by mouth daily.   diclofenac sodium (VOLTAREN) 1 % GEL Apply 2 g topically 4 (four) times daily.   diphenoxylate-atropine (LOMOTIL) 2.5-0.025 MG tablet Take 2 tablets by mouth 4 (four) times daily as needed for diarrhea or loose stools.   empagliflozin (JARDIANCE) 10 MG TABS tablet Take 1 tablet by mouth daily.   ENTRESTO 24-26 MG Take 1 tablet by mouth 2 (two) times daily.   furosemide (LASIX) 40 MG tablet Take 0.5 tablets (20 mg total) by mouth daily. Home dose. (Patient taking differently: Take 20 mg by mouth daily as needed. Home dose.)   glucose blood test strip     lidocaine (XYLOCAINE) 2 % solution Use as directed 15 mLs in the mouth or throat every 3 (three) hours as needed for mouth pain (swish and spit).   magnesium chloride (SLOW-MAG) 64 MG TBEC SR tablet Take 1 tablet (64 mg total) by mouth daily.   Multiple Vitamin (MULTIVITAMIN) tablet Take 1 tablet by mouth daily.   omeprazole (PRILOSEC) 40 MG capsule TAKE 1 CAPSULE IN THE MORNING AND TAKE 1 CAPSULE AT BEDTIME   pentoxifylline (TRENTAL) 400 MG CR tablet Take 400 mg by mouth 3 (three) times daily with meals.   potassium chloride SA (KLOR-CON M) 20 MEQ tablet Take 1 tablet (20 mEq total) by mouth daily. Home med, reduced from twice daily to daily. (Patient taking differently: Take 20 mEq by mouth daily as needed.)   promethazine (PHENERGAN) 25 MG tablet Take 1 tablet (25 mg total) by mouth every 6 (six) hours as needed for nausea or vomiting.   spironolactone (ALDACTONE) 25 MG tablet Take by mouth.   tiZANidine (ZANAFLEX)  4 MG tablet Take 1 tablet (4 mg total) by mouth every 6 (six) hours as needed for muscle spasms.   traMADol (ULTRAM) 50 MG tablet    traZODone (DESYREL) 100 MG tablet TAKE 1 TABLET AT BEDTIME   venlafaxine (EFFEXOR) 75 MG tablet Take 75 mg by mouth daily.   vitamin E 45 MG (100 UNITS) capsule Take by mouth daily.   [DISCONTINUED] atorvastatin (LIPITOR) 10 MG tablet Take 0.5 tablets (5 mg total) by mouth daily.   [DISCONTINUED] bisoprolol (ZEBETA) 10 MG tablet Take 1 tablet (10 mg total) by mouth daily.   [DISCONTINUED] clotrimazole (CLOTRIMAZOLE 3) 2 % vaginal cream Place 1 Applicatorful vaginally at bedtime.   [DISCONTINUED] glipiZIDE (GLUCOTROL XL) 2.5 MG 24 hr tablet Take 1 tablet (2.5 mg total) by mouth daily with breakfast.       12/09/2022    9:50 AM 10/17/2022    2:13 PM 12/07/2021    9:32 AM 06/22/2021    2:03 PM  GAD 7 : Generalized Anxiety Score  Nervous, Anxious, on Edge 1 1 1 2   Control/stop worrying 2 0 0 1  Worry too much - different things 2 0 0 1  Trouble  relaxing 2 0 1 2  Restless 1 0 1 2  Easily annoyed or irritable 1 1 0 0  Afraid - awful might happen 0 0 0 0  Total GAD 7 Score 9 2 3 8   Anxiety Difficulty Somewhat difficult Not difficult at all Not difficult at all Not difficult at all       12/09/2022    9:50 AM 10/17/2022    2:12 PM 09/26/2022    8:16 AM  Depression screen PHQ 2/9  Decreased Interest 1 0 2  Down, Depressed, Hopeless 0 1 0  PHQ - 2 Score 1 1 2   Altered sleeping 3 2 2   Tired, decreased energy 2 2 3   Change in appetite 1 0 1  Feeling bad or failure about yourself  0 0 0  Trouble concentrating 1 0 0  Moving slowly or fidgety/restless 0 0 0  Suicidal thoughts 0 0 0  PHQ-9 Score 8 5 8   Difficult doing work/chores Not difficult at all Not difficult at all Not difficult at all    BP Readings from Last 3 Encounters:  12/09/22 98/60  12/02/22 139/73  11/26/22 119/66    Physical Exam Vitals and nursing note reviewed.  Constitutional:      General: She is not in acute distress.    Appearance: She is well-developed.  HENT:     Head: Normocephalic and atraumatic.     Right Ear: Tympanic membrane and ear canal normal.     Left Ear: Tympanic membrane and ear canal normal.     Nose:     Right Sinus: No maxillary sinus tenderness.     Left Sinus: No maxillary sinus tenderness.  Eyes:     General: No scleral icterus.       Right eye: No discharge.        Left eye: No discharge.     Conjunctiva/sclera: Conjunctivae normal.  Neck:     Thyroid: No thyromegaly.     Vascular: No carotid bruit.  Cardiovascular:     Rate and Rhythm: Normal rate and regular rhythm.     Pulses: Normal pulses.     Heart sounds: Normal heart sounds.  Pulmonary:     Effort: Pulmonary effort is normal. No respiratory distress.     Breath sounds: No wheezing.  Chest:    Abdominal:     General: Bowel sounds are normal.     Palpations: Abdomen is soft.     Tenderness: There is no abdominal tenderness.  Musculoskeletal:     Cervical  back: Normal range of motion. No erythema.     Right lower leg: No edema.     Left lower leg: No edema.  Lymphadenopathy:     Cervical: No cervical adenopathy.  Skin:    General: Skin is warm and dry.     Findings: No rash.  Neurological:     Mental Status: She is alert and oriented to person, place, and time.     Cranial Nerves: No cranial nerve deficit.     Sensory: Sensory deficit (distal feet and toes) present.     Deep Tendon Reflexes: Reflexes are normal and symmetric.  Psychiatric:        Attention and Perception: Attention normal.        Mood and Affect: Mood normal.    Diabetic Foot Exam - Simple   Simple Foot Form Diabetic Foot exam was performed with the following findings: Yes 12/09/2022 10:16 AM  Visual Inspection No deformities, no ulcerations, no other skin breakdown bilaterally: Yes Sensation Testing Pulse Check Posterior Tibialis and Dorsalis pulse intact bilaterally: Yes Comments Decreased sensation distally 2/2 chemotherapy Thickened long nails      Wt Readings from Last 3 Encounters:  12/09/22 152 lb (68.9 kg)  12/02/22 155 lb 14.4 oz (70.7 kg)  10/21/22 154 lb 12.8 oz (70.2 kg)    BP 98/60   Pulse 69   Ht 5\' 3"  (1.6 m)   Wt 152 lb (68.9 kg)   SpO2 97%   BMI 26.93 kg/m   Assessment and Plan:  Problem List Items Addressed This Visit     Vitamin D deficiency   Relevant Orders   VITAMIN D 25 Hydroxy (Vit-D Deficiency, Fractures)   Type II diabetes mellitus with complication (HCC) (Chronic)    Blood sugars stable without hypoglycemic symptoms or events. Currently being treated with glipzide and Jardiance. Lab Results  Component Value Date   HGBA1C 6.6 (H) 06/12/2022        Relevant Medications   atorvastatin (LIPITOR) 10 MG tablet   glipiZIDE (GLUCOTROL XL) 2.5 MG 24 hr tablet   Other Relevant Orders   Hemoglobin A1c   Microalbumin / creatinine urine ratio   Stage 3b chronic kidney disease (CKD) (HCC)    Most recent GFR 41. Will  continue to monitor.      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  Light chain ratio is normal. 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 Repeat immunofixation negative. . Labs reviewed and discussed with patient, stable M protein and stable light chain ratio Today's level is pending.   Daratumumab on hold due to frequent infections despite IVIG Continue acyclovir prophylaxis.         Insomnia    Chronic insomnia despite trazodone. Will try samples of Belsomra 15 mg      Hyperlipidemia associated with type 2 diabetes mellitus (HCC) (Chronic)    Tolerating statin medications.  No side effects noted. LDL is  Lab Results  Component Value Date   LDLCALC 91 02/05/2022        Relevant Medications   bisoprolol (ZEBETA) 10 MG tablet   atorvastatin (LIPITOR) 10 MG tablet   glipiZIDE (GLUCOTROL XL) 2.5 MG 24 hr tablet   Other Relevant Orders   Lipid panel   Gastroesophageal reflux disease without esophagitis (Chronic)    Reflux symptoms are minimal on current therapy - omeprazole. No red flag signs such as weight loss, n/v, melena       Diabetes mellitus treated with oral medication (HCC)   Relevant Medications   atorvastatin (LIPITOR) 10 MG tablet   glipiZIDE (GLUCOTROL XL) 2.5 MG 24 hr tablet   Other Relevant Orders   Ambulatory referral to Podiatry   Depression, major, single episode, in partial remission (HCC)    Clinically stable on current regimen with fairly good control of symptoms, No SI or HI. On low dose Effexor       Benign essential HTN (Chronic)    Stable exam with well controlled BP.   Currently taking bisoprolol and lasix. Tolerating medications without concerns or side effects.        Relevant Medications   bisoprolol (ZEBETA) 10 MG tablet   atorvastatin (LIPITOR) 10 MG tablet   Other Visit Diagnoses     Annual physical exam    -  Primary   recent mammogram was normal.  colonoscopy up to date       Return in about 4 months (around 04/10/2023) for DM.   Partially dictated using Dragon software, any errors are not intentional.  [name], MD [facility] [place], [place]

## 2022-12-09 NOTE — Assessment & Plan Note (Signed)
Tolerating statin medications.  No side effects noted. LDL is  Lab Results  Component Value Date   LDLCALC 91 02/05/2022

## 2022-12-09 NOTE — Assessment & Plan Note (Signed)
Blood sugars stable without hypoglycemic symptoms or events. Currently being treated with glipzide and Jardiance. Lab Results  Component Value Date   HGBA1C 6.6 (H) 06/12/2022

## 2022-12-09 NOTE — Patient Instructions (Addendum)
Schedule annual eye exam.  Take Belsomra 15 mg - one tablet less than 15 minutes before bedtime (lights out, eyes closed). Call for prescription if helpful.

## 2022-12-09 NOTE — Assessment & Plan Note (Signed)
Most recent GFR 41. Will continue to monitor.

## 2022-12-09 NOTE — Assessment & Plan Note (Addendum)
Chronic insomnia despite trazodone. Will try samples of Belsomra 15 mg

## 2022-12-10 ENCOUNTER — Ambulatory Visit: Payer: Medicare PPO

## 2022-12-10 ENCOUNTER — Other Ambulatory Visit: Payer: Medicare PPO

## 2022-12-10 ENCOUNTER — Other Ambulatory Visit: Payer: Self-pay

## 2022-12-10 DIAGNOSIS — C9000 Multiple myeloma not having achieved remission: Secondary | ICD-10-CM

## 2022-12-10 LAB — MICROALBUMIN / CREATININE URINE RATIO
Creatinine, Urine: 179.6 mg/dL
Microalb/Creat Ratio: 53 mg/g creat — ABNORMAL HIGH (ref 0–29)
Microalbumin, Urine: 94.4 ug/mL

## 2022-12-10 NOTE — Progress Notes (Signed)
Daily Session Note  Patient Details  Name: Shylah Dossantos MRN: [id number] Date of Birth: [birth year] Referring Provider:   Flowsheet Row Cardiac Rehab from 08/29/2022 in [facility] Cardiac and Pulmonary Rehab  Referring Provider Joneen Roach, MD       Encounter Date: 12/10/2022  Check In:  Session Check In - 12/10/22 1239       Check-In   Supervising physician immediately available to respond to emergencies See telemetry face sheet for immediately available ER MD    Location [facility]-Cardiac & Pulmonary Rehab    Staff Present Fabio Pierce, MA, RCEP, CCRP, CCET;Zakaiya Lares [place], [place]    Virtual Visit No    Medication changes reported     No    Fall or balance concerns reported    No    Warm-up and Cool-down Performed on first and last piece of equipment    Resistance Training Performed Yes    VAD Patient? No      Pain Assessment   Currently in Pain? No/denies    Multiple Pain Sites No                Social History   Tobacco Use  Smoking Status Former   Packs/day: 1.00   Years: 20.00   Additional pack years: 0.00   Total pack years: 20.00   Types: Cigarettes   Quit date: 07/02/1991   Years since quitting: 31.4  Smokeless Tobacco Never    Goals Met:  Proper associated with RPD/PD & O2 Sat Independence with exercise equipment Exercise tolerated well No report of concerns or symptoms today Strength training completed today  Goals Unmet:  Not Applicable  Comments: Pt able to follow exercise prescription today without complaint.  Will continue to monitor for progression.    Dr. Bethann Punches is Medical Director for [facility] Cardiac Rehabilitation.  Dr. Vida Rigger is Medical Director for [facility] Pulmonary Rehabilitation.

## 2022-12-11 ENCOUNTER — Inpatient Hospital Stay: Payer: Medicare PPO

## 2022-12-11 VITALS — BP 125/83 | HR 66 | Temp 96.7°F | Resp 20

## 2022-12-11 DIAGNOSIS — D509 Iron deficiency anemia, unspecified: Secondary | ICD-10-CM | POA: Diagnosis not present

## 2022-12-11 DIAGNOSIS — T451X5A Adverse effect of antineoplastic and immunosuppressive drugs, initial encounter: Secondary | ICD-10-CM | POA: Diagnosis not present

## 2022-12-11 DIAGNOSIS — C9001 Multiple myeloma in remission: Secondary | ICD-10-CM | POA: Diagnosis not present

## 2022-12-11 DIAGNOSIS — R197 Diarrhea, unspecified: Secondary | ICD-10-CM | POA: Diagnosis not present

## 2022-12-11 DIAGNOSIS — I130 Hypertensive heart and chronic kidney disease with heart failure and stage 1 through stage 4 chronic kidney disease, or unspecified chronic kidney disease: Secondary | ICD-10-CM | POA: Diagnosis not present

## 2022-12-11 DIAGNOSIS — M2550 Pain in unspecified joint: Secondary | ICD-10-CM | POA: Diagnosis not present

## 2022-12-11 DIAGNOSIS — M549 Dorsalgia, unspecified: Secondary | ICD-10-CM | POA: Diagnosis not present

## 2022-12-11 DIAGNOSIS — E118 Type 2 diabetes mellitus with unspecified complications: Secondary | ICD-10-CM

## 2022-12-11 DIAGNOSIS — G620 Drug-induced polyneuropathy: Secondary | ICD-10-CM | POA: Diagnosis not present

## 2022-12-11 LAB — MAGNESIUM: Magnesium: 1.3 mg/dL — ABNORMAL LOW (ref 1.7–2.4)

## 2022-12-11 LAB — HEMOGLOBIN A1C
Hgb A1c MFr Bld: 6.5 % — ABNORMAL HIGH (ref 4.8–5.6)
Mean Plasma Glucose: 139.85 mg/dL

## 2022-12-11 LAB — LIPID PANEL
Cholesterol: 164 mg/dL (ref 0–200)
HDL: 67 mg/dL (ref >40)
LDL Cholesterol: 83 mg/dL (ref 0–99)
Total CHOL/HDL Ratio: 2.4 RATIO
Triglycerides: 68 mg/dL (ref <150)
VLDL: 14 mg/dL (ref 0–40)

## 2022-12-11 LAB — VITAMIN D 25 HYDROXY (VIT D DEFICIENCY, FRACTURES): Vit D, 25-Hydroxy: 25.64 ng/mL — ABNORMAL LOW (ref 30–100)

## 2022-12-11 MED ORDER — SODIUM CHLORIDE 0.9% FLUSH
10.0000 mL | Freq: Once | INTRAVENOUS | Status: AC
  Administered 2022-12-11: 10 mL via INTRAVENOUS
  Filled 2022-12-11: qty 10

## 2022-12-11 MED ORDER — MAGNESIUM SULFATE 2 GM/50ML IV SOLN
2.0000 g | Freq: Once | INTRAVENOUS | Status: AC
  Administered 2022-12-11: 2 g via INTRAVENOUS
  Filled 2022-12-11: qty 50

## 2022-12-11 MED ORDER — SODIUM CHLORIDE 0.9 % IV SOLN
25.0000 mg | Freq: Once | INTRAVENOUS | Status: AC
  Administered 2022-12-11: 25 mg via INTRAVENOUS
  Filled 2022-12-11: qty 1

## 2022-12-11 MED ORDER — MAGNESIUM SULFATE 4 GM/100ML IV SOLN
4.0000 g | Freq: Once | INTRAVENOUS | Status: AC
  Administered 2022-12-11: 4 g via INTRAVENOUS

## 2022-12-11 MED ORDER — HEPARIN SOD (PORK) LOCK FLUSH 100 UNIT/ML IV SOLN
500.0000 [IU] | Freq: Once | INTRAVENOUS | Status: AC
  Administered 2022-12-11: 500 [IU] via INTRAVENOUS
  Filled 2022-12-11: qty 5

## 2022-12-11 MED ORDER — SODIUM CHLORIDE 0.9 % IV SOLN
Freq: Once | INTRAVENOUS | Status: AC
  Filled 2022-12-11: qty 250

## 2022-12-11 NOTE — Progress Notes (Signed)
Pt PCP requesting labs to be drawn from port..Lipid pane, A1C, and Vitamin D. Per Dr Cathie Hoops okay to place orders and draw from port

## 2022-12-16 ENCOUNTER — Inpatient Hospital Stay: Payer: Medicare PPO

## 2022-12-16 VITALS — BP 129/60 | HR 86 | Temp 97.6°F | Resp 20

## 2022-12-16 DIAGNOSIS — C9001 Multiple myeloma in remission: Secondary | ICD-10-CM

## 2022-12-16 DIAGNOSIS — M549 Dorsalgia, unspecified: Secondary | ICD-10-CM | POA: Diagnosis not present

## 2022-12-16 DIAGNOSIS — Z95828 Presence of other vascular implants and grafts: Secondary | ICD-10-CM

## 2022-12-16 DIAGNOSIS — D509 Iron deficiency anemia, unspecified: Secondary | ICD-10-CM | POA: Diagnosis not present

## 2022-12-16 DIAGNOSIS — R197 Diarrhea, unspecified: Secondary | ICD-10-CM | POA: Diagnosis not present

## 2022-12-16 DIAGNOSIS — G620 Drug-induced polyneuropathy: Secondary | ICD-10-CM | POA: Diagnosis not present

## 2022-12-16 DIAGNOSIS — I130 Hypertensive heart and chronic kidney disease with heart failure and stage 1 through stage 4 chronic kidney disease, or unspecified chronic kidney disease: Secondary | ICD-10-CM | POA: Diagnosis not present

## 2022-12-16 DIAGNOSIS — M2550 Pain in unspecified joint: Secondary | ICD-10-CM | POA: Diagnosis not present

## 2022-12-16 DIAGNOSIS — T451X5A Adverse effect of antineoplastic and immunosuppressive drugs, initial encounter: Secondary | ICD-10-CM | POA: Diagnosis not present

## 2022-12-16 LAB — MAGNESIUM: Magnesium: 1.2 mg/dL — ABNORMAL LOW (ref 1.7–2.4)

## 2022-12-16 MED ORDER — SODIUM CHLORIDE 0.9 % IV SOLN
25.0000 mg | Freq: Once | INTRAVENOUS | Status: AC
  Administered 2022-12-16: 25 mg via INTRAVENOUS
  Filled 2022-12-16: qty 1

## 2022-12-16 MED ORDER — HEPARIN SOD (PORK) LOCK FLUSH 100 UNIT/ML IV SOLN
500.0000 [IU] | Freq: Once | INTRAVENOUS | Status: AC
  Administered 2022-12-16: 500 [IU]
  Filled 2022-12-16: qty 5

## 2022-12-16 MED ORDER — MAGNESIUM SULFATE 4 GM/100ML IV SOLN
4.0000 g | Freq: Once | INTRAVENOUS | Status: AC
  Administered 2022-12-16: 4 g via INTRAVENOUS
  Filled 2022-12-16: qty 100

## 2022-12-16 MED ORDER — SODIUM CHLORIDE 0.9 % IV SOLN
Freq: Once | INTRAVENOUS | Status: AC
  Filled 2022-12-16: qty 250

## 2022-12-16 MED ORDER — MAGNESIUM SULFATE 2 GM/50ML IV SOLN
2.0000 g | Freq: Once | INTRAVENOUS | Status: AC
  Administered 2022-12-16: 2 g via INTRAVENOUS
  Filled 2022-12-16: qty 50

## 2022-12-16 MED ORDER — SODIUM CHLORIDE 0.9% FLUSH
10.0000 mL | Freq: Once | INTRAVENOUS | Status: AC
  Administered 2022-12-16: 10 mL via INTRAVENOUS
  Filled 2022-12-16: qty 10

## 2022-12-16 NOTE — Patient Instructions (Signed)
Magnesium Sulfate Injection What is this medication? MAGNESIUM SULFATE (mag NEE zee um SUL fate) prevents and treats low levels of magnesium in your body. It may also be used to prevent and treat seizures during pregnancy in people with high blood pressure disorders, such as preeclampsia or eclampsia. Magnesium plays an important role in maintaining the health of your muscles and nervous system. This medicine may be used for other purposes; ask your health care provider or pharmacist if you have questions. What should I tell my care team before I take this medication? They need to know if you have any of these conditions: Heart disease History of irregular heart beat Kidney disease An unusual or allergic reaction to magnesium sulfate, medications, foods, dyes, or preservatives Pregnant or trying to get pregnant Breast-feeding How should I use this medication? This medication is for infusion into a vein. It is given in a hospital or clinic setting. Talk to your care team about the use of this medication in children. While this medication may be prescribed for selected conditions, precautions do apply. Overdosage: If you think you have taken too much of this medicine contact a poison control center or emergency room at once. NOTE: This medicine is only for you. Do not share this medicine with others. What if I miss a dose? This does not apply. What may interact with this medication? Certain medications for anxiety or sleep Certain medications for seizures, such phenobarbital Digoxin Medications that relax muscles for surgery Narcotic medications for pain This list may not describe all possible interactions. Give your health care provider a list of all the medicines, herbs, non-prescription drugs, or dietary supplements you use. Also tell them if you smoke, drink alcohol, or use illegal drugs. Some items may interact with your medicine. What should I watch for while using this  medication? Your condition will be monitored carefully while you are receiving this medication. You may need blood work done while you are receiving this medication. What side effects may I notice from receiving this medication? Side effects that you should report to your care team as soon as possible: Allergic reactions--skin rash, itching, hives, swelling of the face, lips, tongue, or throat High magnesium level--confusion, drowsiness, facial flushing, redness, sweating, muscle weakness, fast or irregular heartbeat, trouble breathing Low blood pressure--dizziness, feeling faint or lightheaded, blurry vision Side effects that usually do not require medical attention (report to your care team if they continue or are bothersome): Headache Nausea This list may not describe all possible side effects. Call your doctor for medical advice about side effects. You may report side effects to FDA at 1-800-FDA-1088. Where should I keep my medication? This medication is given in a hospital or clinic and will not be stored at home. NOTE: This sheet is a summary. It may not cover all possible information. If you have questions about this medicine, talk to your doctor, pharmacist, or health care provider.  2024 [employer]/Gold Standard (2021-02-28 00:00:00)  

## 2022-12-17 ENCOUNTER — Encounter: Payer: Self-pay | Admitting: Internal Medicine

## 2022-12-17 ENCOUNTER — Other Ambulatory Visit: Payer: Self-pay | Admitting: Internal Medicine

## 2022-12-17 DIAGNOSIS — F5101 Primary insomnia: Secondary | ICD-10-CM

## 2022-12-17 MED ORDER — DIAZEPAM 5 MG PO TABS
2.5000 mg | ORAL_TABLET | Freq: Every evening | ORAL | 0 refills | Status: DC | PRN
Start: 2022-12-17 — End: 2023-01-22

## 2022-12-17 NOTE — Telephone Encounter (Signed)
Pt response.  KP

## 2022-12-17 NOTE — Telephone Encounter (Signed)
Please review.  KP

## 2022-12-23 ENCOUNTER — Inpatient Hospital Stay: Payer: Medicare PPO

## 2022-12-23 VITALS — BP 136/69 | HR 70 | Temp 96.7°F | Resp 20

## 2022-12-23 DIAGNOSIS — D509 Iron deficiency anemia, unspecified: Secondary | ICD-10-CM | POA: Diagnosis not present

## 2022-12-23 DIAGNOSIS — M2550 Pain in unspecified joint: Secondary | ICD-10-CM | POA: Diagnosis not present

## 2022-12-23 DIAGNOSIS — T451X5A Adverse effect of antineoplastic and immunosuppressive drugs, initial encounter: Secondary | ICD-10-CM | POA: Diagnosis not present

## 2022-12-23 DIAGNOSIS — G620 Drug-induced polyneuropathy: Secondary | ICD-10-CM | POA: Diagnosis not present

## 2022-12-23 DIAGNOSIS — I130 Hypertensive heart and chronic kidney disease with heart failure and stage 1 through stage 4 chronic kidney disease, or unspecified chronic kidney disease: Secondary | ICD-10-CM | POA: Diagnosis not present

## 2022-12-23 DIAGNOSIS — C9001 Multiple myeloma in remission: Secondary | ICD-10-CM

## 2022-12-23 DIAGNOSIS — R197 Diarrhea, unspecified: Secondary | ICD-10-CM | POA: Diagnosis not present

## 2022-12-23 DIAGNOSIS — M549 Dorsalgia, unspecified: Secondary | ICD-10-CM | POA: Diagnosis not present

## 2022-12-23 LAB — BASIC METABOLIC PANEL - CANCER CENTER ONLY
Anion gap: 10 (ref 5–15)
BUN: 27 mg/dL — ABNORMAL HIGH (ref 8–23)
CO2: 21 mmol/L — ABNORMAL LOW (ref 22–32)
Calcium: 8.7 mg/dL — ABNORMAL LOW (ref 8.9–10.3)
Chloride: 107 mmol/L (ref 98–111)
Creatinine: 1.29 mg/dL — ABNORMAL HIGH (ref 0.44–1.00)
GFR, Estimated: 46 mL/min — ABNORMAL LOW (ref >60)
Glucose, Bld: 161 mg/dL — ABNORMAL HIGH (ref 70–99)
Potassium: 4 mmol/L (ref 3.5–5.1)
Sodium: 138 mmol/L (ref 135–145)

## 2022-12-23 LAB — MAGNESIUM: Magnesium: 1.3 mg/dL — ABNORMAL LOW (ref 1.7–2.4)

## 2022-12-23 MED ORDER — MAGNESIUM SULFATE 2 GM/50ML IV SOLN
2.0000 g | Freq: Once | INTRAVENOUS | Status: AC
  Administered 2022-12-23: 2 g via INTRAVENOUS
  Filled 2022-12-23: qty 50

## 2022-12-23 MED ORDER — HEPARIN SOD (PORK) LOCK FLUSH 100 UNIT/ML IV SOLN
500.0000 [IU] | Freq: Once | INTRAVENOUS | Status: AC | PRN
  Administered 2022-12-23: 500 [IU]
  Filled 2022-12-23: qty 5

## 2022-12-23 MED ORDER — SODIUM CHLORIDE 0.9 % IV SOLN
25.0000 mg | Freq: Once | INTRAVENOUS | Status: AC
  Administered 2022-12-23: 25 mg via INTRAVENOUS
  Filled 2022-12-23: qty 1

## 2022-12-23 MED ORDER — SODIUM CHLORIDE 0.9% FLUSH
10.0000 mL | Freq: Once | INTRAVENOUS | Status: AC | PRN
  Administered 2022-12-23: 10 mL
  Filled 2022-12-23: qty 10

## 2022-12-23 MED ORDER — SODIUM CHLORIDE 0.9 % IV SOLN
Freq: Once | INTRAVENOUS | Status: AC
  Filled 2022-12-23: qty 250

## 2022-12-23 MED ORDER — MAGNESIUM SULFATE 4 GM/100ML IV SOLN
4.0000 g | Freq: Once | INTRAVENOUS | Status: AC
  Administered 2022-12-23: 4 g via INTRAVENOUS
  Filled 2022-12-23: qty 100

## 2022-12-24 DIAGNOSIS — C9000 Multiple myeloma not having achieved remission: Secondary | ICD-10-CM

## 2022-12-24 NOTE — Progress Notes (Signed)
Daily Session Note  Patient Details  Name: Sarah [name] MRN: [id number] Date of Birth: May 09, 1956 Referring Provider:   Flowsheet Row Cardiac Rehab from 08/29/2022 in [facility] Cardiac [facility] Pul[facility] Re[facility]  Referring Provider Joneen Roach, MD       Encounter Date: 12/24/2022  Check In:  Session Check In - 12/24/22 1247       Check-In   Supervising physician immediately available to respond to emergencies See telemetry face sheet for immediately available ER MD    Location [facility]-Cardiac & Pulmonary Rehab    Staff Present Ronette Deter, BS, Exercise Physiologist    Virtual Visit No    Medication changes reported     Yes    Comments Started valium 5mg , and effexor 75 mg    Fall or balance concerns reported    No    Tobacco Cessation No Change    Warm-up and Cool-down Performed on first and last piece of equipment    Resistance Training Performed No    VAD Patient? No    PAD/SET Patient? No      Pain Assessment   Currently in Pain? No/denies    Multiple Pain Sites No                Social History   Tobacco Use  Smoking Status Former   Packs/day: 1.00   Years: 20.00   Additional pack years: 0.00   Total pack years: 20.00   Types: Cigarettes   Quit date: 07/02/1991   Years since quitting: 31.5  Smokeless Tobacco Never    Goals Met:  Independence with exercise equipment Exercise tolerated well No report of concerns or symptoms today  Goals Unmet:  Not Applicable  Comments: Pt able to follow exercise prescription today without complaint.  Will continue to monitor for progression.    Dr. Bethann Punches is Medical Director for [facility] Card[facility].  Dr. Vida Rigger is Medical Director for [facility] Pulmonary Rehabilitation.

## 2022-12-30 ENCOUNTER — Inpatient Hospital Stay: Payer: Medicare PPO

## 2022-12-30 ENCOUNTER — Inpatient Hospital Stay: Payer: Medicare PPO | Attending: Oncology

## 2022-12-30 DIAGNOSIS — D500 Iron deficiency anemia secondary to blood loss (chronic): Secondary | ICD-10-CM | POA: Insufficient documentation

## 2022-12-30 DIAGNOSIS — N1832 Chronic kidney disease, stage 3b: Secondary | ICD-10-CM | POA: Insufficient documentation

## 2022-12-30 DIAGNOSIS — I130 Hypertensive heart and chronic kidney disease with heart failure and stage 1 through stage 4 chronic kidney disease, or unspecified chronic kidney disease: Secondary | ICD-10-CM | POA: Insufficient documentation

## 2022-12-30 DIAGNOSIS — Z885 Allergy status to narcotic agent status: Secondary | ICD-10-CM | POA: Insufficient documentation

## 2022-12-30 DIAGNOSIS — Z90710 Acquired absence of both cervix and uterus: Secondary | ICD-10-CM | POA: Insufficient documentation

## 2022-12-30 DIAGNOSIS — Z803 Family history of malignant neoplasm of breast: Secondary | ICD-10-CM | POA: Insufficient documentation

## 2022-12-30 DIAGNOSIS — D801 Nonfamilial hypogammaglobulinemia: Secondary | ICD-10-CM | POA: Diagnosis not present

## 2022-12-30 DIAGNOSIS — Z886 Allergy status to analgesic agent status: Secondary | ICD-10-CM | POA: Diagnosis not present

## 2022-12-30 DIAGNOSIS — Z79899 Other long term (current) drug therapy: Secondary | ICD-10-CM | POA: Diagnosis not present

## 2022-12-30 DIAGNOSIS — Z9049 Acquired absence of other specified parts of digestive tract: Secondary | ICD-10-CM | POA: Diagnosis not present

## 2022-12-30 DIAGNOSIS — G629 Polyneuropathy, unspecified: Secondary | ICD-10-CM | POA: Diagnosis not present

## 2022-12-30 DIAGNOSIS — D702 Other drug-induced agranulocytosis: Secondary | ICD-10-CM | POA: Diagnosis not present

## 2022-12-30 DIAGNOSIS — I429 Cardiomyopathy, unspecified: Secondary | ICD-10-CM | POA: Insufficient documentation

## 2022-12-30 DIAGNOSIS — T451X5A Adverse effect of antineoplastic and immunosuppressive drugs, initial encounter: Secondary | ICD-10-CM | POA: Insufficient documentation

## 2022-12-30 DIAGNOSIS — K219 Gastro-esophageal reflux disease without esophagitis: Secondary | ICD-10-CM | POA: Insufficient documentation

## 2022-12-30 DIAGNOSIS — Z87891 Personal history of nicotine dependence: Secondary | ICD-10-CM | POA: Diagnosis not present

## 2022-12-30 DIAGNOSIS — E1122 Type 2 diabetes mellitus with diabetic chronic kidney disease: Secondary | ICD-10-CM | POA: Diagnosis not present

## 2022-12-30 DIAGNOSIS — C9001 Multiple myeloma in remission: Secondary | ICD-10-CM | POA: Insufficient documentation

## 2022-12-30 DIAGNOSIS — Z822 Family history of deafness and hearing loss: Secondary | ICD-10-CM | POA: Insufficient documentation

## 2022-12-30 DIAGNOSIS — I252 Old myocardial infarction: Secondary | ICD-10-CM | POA: Diagnosis not present

## 2022-12-30 DIAGNOSIS — Z833 Family history of diabetes mellitus: Secondary | ICD-10-CM | POA: Insufficient documentation

## 2022-12-30 DIAGNOSIS — Z888 Allergy status to other drugs, medicaments and biological substances status: Secondary | ICD-10-CM | POA: Insufficient documentation

## 2022-12-30 DIAGNOSIS — Z800 Family history of malignant neoplasm of digestive organs: Secondary | ICD-10-CM | POA: Insufficient documentation

## 2022-12-30 DIAGNOSIS — I350 Nonrheumatic aortic (valve) stenosis: Secondary | ICD-10-CM | POA: Insufficient documentation

## 2022-12-30 DIAGNOSIS — Z801 Family history of malignant neoplasm of trachea, bronchus and lung: Secondary | ICD-10-CM | POA: Insufficient documentation

## 2022-12-30 DIAGNOSIS — I509 Heart failure, unspecified: Secondary | ICD-10-CM | POA: Diagnosis not present

## 2022-12-30 DIAGNOSIS — Z8249 Family history of ischemic heart disease and other diseases of the circulatory system: Secondary | ICD-10-CM | POA: Insufficient documentation

## 2022-12-30 DIAGNOSIS — Z832 Family history of diseases of the blood and blood-forming organs and certain disorders involving the immune mechanism: Secondary | ICD-10-CM | POA: Insufficient documentation

## 2022-12-30 DIAGNOSIS — E538 Deficiency of other specified B group vitamins: Secondary | ICD-10-CM | POA: Diagnosis not present

## 2022-12-30 DIAGNOSIS — Z841 Family history of disorders of kidney and ureter: Secondary | ICD-10-CM | POA: Insufficient documentation

## 2022-12-30 DIAGNOSIS — Z808 Family history of malignant neoplasm of other organs or systems: Secondary | ICD-10-CM | POA: Insufficient documentation

## 2022-12-30 LAB — MAGNESIUM: Magnesium: 1.4 mg/dL — ABNORMAL LOW (ref 1.7–2.4)

## 2022-12-30 MED ORDER — SODIUM CHLORIDE 0.9 % IV SOLN
25.0000 mg | Freq: Once | INTRAVENOUS | Status: AC
  Administered 2022-12-30: 25 mg via INTRAVENOUS
  Filled 2022-12-30: qty 1

## 2022-12-30 MED ORDER — MAGNESIUM SULFATE 2 GM/50ML IV SOLN
2.0000 g | Freq: Once | INTRAVENOUS | Status: AC
  Administered 2022-12-30: 2 g via INTRAVENOUS
  Filled 2022-12-30: qty 50

## 2022-12-30 MED ORDER — HEPARIN SOD (PORK) LOCK FLUSH 100 UNIT/ML IV SOLN
500.0000 [IU] | Freq: Once | INTRAVENOUS | Status: AC | PRN
  Administered 2022-12-30: 500 [IU]
  Filled 2022-12-30: qty 5

## 2022-12-30 MED ORDER — SODIUM CHLORIDE 0.9% FLUSH
10.0000 mL | Freq: Once | INTRAVENOUS | Status: AC | PRN
  Administered 2022-12-30: 10 mL
  Filled 2022-12-30: qty 10

## 2022-12-30 MED ORDER — SODIUM CHLORIDE 0.9 % IV SOLN
Freq: Once | INTRAVENOUS | Status: AC
  Filled 2022-12-30: qty 250

## 2022-12-31 ENCOUNTER — Other Ambulatory Visit: Payer: Self-pay | Admitting: Oncology

## 2023-01-06 ENCOUNTER — Inpatient Hospital Stay: Payer: Medicare PPO

## 2023-01-06 VITALS — BP 142/66 | Temp 96.6°F | Resp 20

## 2023-01-06 DIAGNOSIS — G629 Polyneuropathy, unspecified: Secondary | ICD-10-CM | POA: Diagnosis not present

## 2023-01-06 DIAGNOSIS — C9001 Multiple myeloma in remission: Secondary | ICD-10-CM | POA: Diagnosis not present

## 2023-01-06 DIAGNOSIS — T451X5A Adverse effect of antineoplastic and immunosuppressive drugs, initial encounter: Secondary | ICD-10-CM | POA: Diagnosis not present

## 2023-01-06 DIAGNOSIS — E538 Deficiency of other specified B group vitamins: Secondary | ICD-10-CM | POA: Diagnosis not present

## 2023-01-06 DIAGNOSIS — D702 Other drug-induced agranulocytosis: Secondary | ICD-10-CM | POA: Diagnosis not present

## 2023-01-06 DIAGNOSIS — I509 Heart failure, unspecified: Secondary | ICD-10-CM | POA: Diagnosis not present

## 2023-01-06 DIAGNOSIS — D801 Nonfamilial hypogammaglobulinemia: Secondary | ICD-10-CM | POA: Diagnosis not present

## 2023-01-06 DIAGNOSIS — D500 Iron deficiency anemia secondary to blood loss (chronic): Secondary | ICD-10-CM | POA: Diagnosis not present

## 2023-01-06 LAB — MAGNESIUM: Magnesium: 1.2 mg/dL — ABNORMAL LOW (ref 1.7–2.4)

## 2023-01-06 MED ORDER — SODIUM CHLORIDE 0.9 % IV SOLN
Freq: Once | INTRAVENOUS | Status: AC
  Filled 2023-01-06: qty 250

## 2023-01-06 MED ORDER — SODIUM CHLORIDE 0.9 % IV SOLN
25.0000 mg | Freq: Once | INTRAVENOUS | Status: AC
  Administered 2023-01-06: 25 mg via INTRAVENOUS
  Filled 2023-01-06: qty 1

## 2023-01-06 MED ORDER — MAGNESIUM SULFATE 4 GM/100ML IV SOLN
4.0000 g | Freq: Once | INTRAVENOUS | Status: AC
  Administered 2023-01-06: 4 g via INTRAVENOUS
  Filled 2023-01-06: qty 100

## 2023-01-06 MED ORDER — MAGNESIUM SULFATE 2 GM/50ML IV SOLN
2.0000 g | Freq: Once | INTRAVENOUS | Status: AC
  Administered 2023-01-06: 2 g via INTRAVENOUS
  Filled 2023-01-06: qty 50

## 2023-01-06 MED ORDER — SODIUM CHLORIDE 0.9% FLUSH
10.0000 mL | Freq: Once | INTRAVENOUS | Status: AC | PRN
  Administered 2023-01-06: 10 mL
  Filled 2023-01-06: qty 10

## 2023-01-06 MED ORDER — HEPARIN SOD (PORK) LOCK FLUSH 100 UNIT/ML IV SOLN
500.0000 [IU] | Freq: Once | INTRAVENOUS | Status: AC | PRN
  Administered 2023-01-06: 500 [IU]
  Filled 2023-01-06: qty 5

## 2023-01-07 ENCOUNTER — Encounter: Payer: Medicare PPO | Attending: Family Medicine | Admitting: *Deleted

## 2023-01-07 DIAGNOSIS — C9000 Multiple myeloma not having achieved remission: Secondary | ICD-10-CM | POA: Insufficient documentation

## 2023-01-07 NOTE — Progress Notes (Signed)
Daily Session Note  Patient Details  Name: Sarah [name] MRN: [id number] Date of Birth: 1955/08/22 Referring Provider:   Flowsheet Row Cardiac Rehab from 08/29/2022 in [facility] Cardiac and Pulmonary Rehab  Referring Provider Joneen Roach, MD       Encounter Date: 01/07/2023  Check In:  Session Check In - 01/07/23 1227       Check-In   Supervising physician immediately available to respond to emergencies See telemetry face sheet for immediately available ER MD    Location [facility] & Pul[facility]    Staff Present Susann Givens, RN BSN;Susanne Bice, RN, BSN, CCRP    Virtual Visit No    Medication changes reported     No    Fall or balance concerns reported    No    Warm-up and Cool-down Performed on first and last piece of equipment    Resistance Training Performed Yes    VAD Patient? No    PAD/SET Patient? No      Pain Assessment   Currently in Pain? No/denies                Social History   Tobacco Use  Smoking Status Former   Packs/day: 1.00   Years: 20.00   Additional pack years: 0.00   Total pack years: 20.00   Types: Cigarettes   Quit date: 07/02/1991   Years since quitting: 31.5  Smokeless Tobacco Never    Goals Met:  Independence with exercise equipment Exercise tolerated well No report of concerns or symptoms today Strength training completed today  Goals Unmet:  Not Applicable  Comments: Pt able to follow exercise prescription today without complaint.  Will continue to monitor for progression.    Dr. Bethann Punches is Medical Director for [facility] Card[facility] Rehabilitation.  Dr. Vida Rigger is Medical Director for [facility] Pulmonary Rehabilitation.

## 2023-01-13 ENCOUNTER — Inpatient Hospital Stay: Payer: Medicare PPO | Admitting: Oncology

## 2023-01-13 ENCOUNTER — Inpatient Hospital Stay: Payer: Medicare PPO

## 2023-01-13 ENCOUNTER — Encounter: Payer: Self-pay | Admitting: Oncology

## 2023-01-13 VITALS — BP 129/74 | HR 62 | Temp 96.5°F | Resp 18 | Wt 153.5 lb

## 2023-01-13 DIAGNOSIS — C9000 Multiple myeloma not having achieved remission: Secondary | ICD-10-CM

## 2023-01-13 DIAGNOSIS — R197 Diarrhea, unspecified: Secondary | ICD-10-CM

## 2023-01-13 DIAGNOSIS — Z7682 Awaiting organ transplant status: Secondary | ICD-10-CM

## 2023-01-13 DIAGNOSIS — D500 Iron deficiency anemia secondary to blood loss (chronic): Secondary | ICD-10-CM

## 2023-01-13 DIAGNOSIS — C9001 Multiple myeloma in remission: Secondary | ICD-10-CM

## 2023-01-13 DIAGNOSIS — G629 Polyneuropathy, unspecified: Secondary | ICD-10-CM

## 2023-01-13 DIAGNOSIS — E538 Deficiency of other specified B group vitamins: Secondary | ICD-10-CM | POA: Diagnosis not present

## 2023-01-13 DIAGNOSIS — D702 Other drug-induced agranulocytosis: Secondary | ICD-10-CM | POA: Diagnosis not present

## 2023-01-13 DIAGNOSIS — I509 Heart failure, unspecified: Secondary | ICD-10-CM | POA: Diagnosis not present

## 2023-01-13 DIAGNOSIS — D801 Nonfamilial hypogammaglobulinemia: Secondary | ICD-10-CM | POA: Diagnosis not present

## 2023-01-13 DIAGNOSIS — T451X5A Adverse effect of antineoplastic and immunosuppressive drugs, initial encounter: Secondary | ICD-10-CM | POA: Diagnosis not present

## 2023-01-13 LAB — CMP (CANCER CENTER ONLY)
ALT: 26 U/L (ref 0–44)
AST: 29 U/L (ref 15–41)
Albumin: 4.3 g/dL (ref 3.5–5.0)
Alkaline Phosphatase: 79 U/L (ref 38–126)
Anion gap: 10 (ref 5–15)
BUN: 21 mg/dL (ref 8–23)
CO2: 21 mmol/L — ABNORMAL LOW (ref 22–32)
Calcium: 8.6 mg/dL — ABNORMAL LOW (ref 8.9–10.3)
Chloride: 106 mmol/L (ref 98–111)
Creatinine: 1.3 mg/dL — ABNORMAL HIGH (ref 0.44–1.00)
GFR, Estimated: 45 mL/min — ABNORMAL LOW (ref >60)
Glucose, Bld: 157 mg/dL — ABNORMAL HIGH (ref 70–99)
Potassium: 4 mmol/L (ref 3.5–5.1)
Sodium: 137 mmol/L (ref 135–145)
Total Bilirubin: 0.4 mg/dL (ref 0.3–1.2)
Total Protein: 7.4 g/dL (ref 6.5–8.1)

## 2023-01-13 LAB — CBC WITH DIFFERENTIAL (CANCER CENTER ONLY)
Abs Immature Granulocytes: 0.03 10*3/uL (ref 0.00–0.07)
Basophils Absolute: 0 10*3/uL (ref 0.0–0.1)
Basophils Relative: 0 %
Eosinophils Absolute: 0.2 10*3/uL (ref 0.0–0.5)
Eosinophils Relative: 2 %
HCT: 36.8 % (ref 36.0–46.0)
Hemoglobin: 12.3 g/dL (ref 12.0–15.0)
Immature Granulocytes: 0 %
Lymphocytes Relative: 25 %
Lymphs Abs: 2 10*3/uL (ref 0.7–4.0)
MCH: 28.4 pg (ref 26.0–34.0)
MCHC: 33.4 g/dL (ref 30.0–36.0)
MCV: 85 fL (ref 80.0–100.0)
Monocytes Absolute: 0.7 10*3/uL (ref 0.1–1.0)
Monocytes Relative: 9 %
Neutro Abs: 4.9 10*3/uL (ref 1.7–7.7)
Neutrophils Relative %: 64 %
Platelet Count: 145 10*3/uL — ABNORMAL LOW (ref 150–400)
RBC: 4.33 MIL/uL (ref 3.87–5.11)
RDW: 13.6 % (ref 11.5–15.5)
WBC Count: 7.7 10*3/uL (ref 4.0–10.5)
nRBC: 0 % (ref 0.0–0.2)

## 2023-01-13 LAB — MAGNESIUM: Magnesium: 1.1 mg/dL — ABNORMAL LOW (ref 1.7–2.4)

## 2023-01-13 LAB — VITAMIN B12: Vitamin B-12: 389 pg/mL (ref 180–914)

## 2023-01-13 MED ORDER — SODIUM CHLORIDE 0.9% FLUSH
10.0000 mL | Freq: Once | INTRAVENOUS | Status: AC
  Administered 2023-01-13: 10 mL via INTRAVENOUS
  Filled 2023-01-13: qty 10

## 2023-01-13 MED ORDER — CYANOCOBALAMIN 1000 MCG/ML IJ SOLN
1000.0000 ug | Freq: Once | INTRAMUSCULAR | Status: AC
  Administered 2023-01-13: 1000 ug via INTRAMUSCULAR
  Filled 2023-01-13: qty 1

## 2023-01-13 MED ORDER — SODIUM CHLORIDE 0.9% FLUSH
10.0000 mL | Freq: Once | INTRAVENOUS | Status: DC | PRN
  Filled 2023-01-13: qty 10

## 2023-01-13 MED ORDER — SODIUM CHLORIDE 0.9 % IV SOLN
25.0000 mg | Freq: Once | INTRAVENOUS | Status: AC
  Administered 2023-01-13: 25 mg via INTRAVENOUS
  Filled 2023-01-13: qty 1

## 2023-01-13 MED ORDER — HEPARIN SOD (PORK) LOCK FLUSH 100 UNIT/ML IV SOLN
500.0000 [IU] | Freq: Once | INTRAVENOUS | Status: AC
  Administered 2023-01-13: 500 [IU] via INTRAVENOUS
  Filled 2023-01-13: qty 5

## 2023-01-13 MED ORDER — MAGNESIUM SULFATE 50 % IJ SOLN
6.0000 g | Freq: Once | INTRAMUSCULAR | Status: DC
Start: 2023-01-13 — End: 2023-01-13

## 2023-01-13 MED ORDER — HEPARIN SOD (PORK) LOCK FLUSH 100 UNIT/ML IV SOLN
500.0000 [IU] | Freq: Once | INTRAVENOUS | Status: DC | PRN
  Filled 2023-01-13: qty 5

## 2023-01-13 MED ORDER — MAGNESIUM SULFATE 2 GM/50ML IV SOLN
2.0000 g | Freq: Once | INTRAVENOUS | Status: AC
  Administered 2023-01-13: 2 g via INTRAVENOUS
  Filled 2023-01-13: qty 50

## 2023-01-13 MED ORDER — SODIUM CHLORIDE 0.9 % IV SOLN
Freq: Once | INTRAVENOUS | Status: AC
  Filled 2023-01-13: qty 250

## 2023-01-13 MED ORDER — MAGNESIUM SULFATE 4 GM/100ML IV SOLN
4.0000 g | Freq: Once | INTRAVENOUS | Status: AC
  Administered 2023-01-13: 4 g via INTRAVENOUS
  Filled 2023-01-13: qty 100

## 2023-01-13 NOTE — Assessment & Plan Note (Signed)
#  Chronic hypomagnesemia proceed with  IV magnesium today.  Continue weekly magnesium +/- IV magnesium.

## 2023-01-13 NOTE — Assessment & Plan Note (Signed)
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  Light chain ratio is normal. 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 Repeat immunofixation negative. . Labs reviewed and discussed with patient, stable M protein and stable light chain ratio Today's level is pending.   Daratumumab on hold due to frequent infections despite IVIG Continue acyclovir prophylaxis.   

## 2023-01-13 NOTE — Assessment & Plan Note (Addendum)
B12 level is pending continue B12 injections Q 6 weeks.

## 2023-01-13 NOTE — Assessment & Plan Note (Signed)
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   Previously on Lyrica and nortriptyline-not effective Neurology recommend Tramadol and may consider higher dose of  Nortriptyline in the future. Follows up with neurology. 

## 2023-01-13 NOTE — Assessment & Plan Note (Addendum)
Lab Results  Component Value Date   HGB 12.3 01/13/2023   TIBC 375 12/02/2022   IRONPCTSAT 18 12/02/2022   FERRITIN 26 12/02/2022    S/p Venofer treatments. Hb has improved. Observation.

## 2023-01-13 NOTE — Progress Notes (Signed)
Hematology/Oncology Progress note [phone] [phone]     Chief Complaint: Sarah [name] is a 67 y.o. 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  Light chain ratio is normal. 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 Repeat immunofixation negative. . Labs reviewed and discussed with patient, stable M protein and stable light chain ratio Today's level is pending.   Daratumumab on hold due to frequent infections despite IVIG Continue acyclovir prophylaxis.    Iron deficiency anemia due to chronic blood loss Lab Results  Component Value Date   HGB 12.3 01/13/2023   TIBC 375 12/02/2022   IRONPCTSAT 18 12/02/2022   FERRITIN 26 12/02/2022    S/p Venofer treatments. Hb has improved. Observation.    Hypomagnesemia #Chronic hypomagnesemia proceed with  IV magnesium today.  Continue weekly magnesium +/- IV magnesium.     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   Previously on Lyrica and nortriptyline-not effective Neurology recommend Tramadol and may consider higher dose of  Nortriptyline in the future.  Follows up with neurology.  B12 deficiency B12 level is pending continue B12 injections Q 6 weeks.     Orders Placed This Encounter  Procedures   MR Thoracic Spine W Wo Contrast    Standing Status:   Future    Standing Expiration Date:   01/13/2024    Order Specific Question:   GRA to provide read?    Answer:   Yes    Order Specific Question:   If indicated for the ordered procedure, I authorize the  administration of contrast media per Radiology protocol    Answer:   Yes    Order Specific Question:   What is the patient's sedation requirement?    Answer:   No Sedation    Order Specific Question:   Use SRS Protocol?    Answer:   Yes    Order Specific Question:   Does the patient have a pacemaker or implanted devices?    Answer:   No    Order Specific Question:   Preferred imaging location?    Answer:   [facility] (table limit - 550lbs)   MR Lumbar Spine W Wo Contrast    Standing Status:   Future    Standing Expiration Date:   01/13/2024    Order Specific Question:   If indicated for the ordered procedure, I authorize the administration of contrast media per Radiology protocol    Answer:   Yes    Order Specific Question:   What is the patient's sedation requirement?    Answer:   No Sedation    Order Specific Question:   Does the patient have a pacemaker or implanted devices?    Answer:   No    Order Specific Question:   Use SRS Protocol?    Answer:   Yes    Order Specific Question:   Preferred imaging location?    Answer:   [facility] (table limit - 550lbs)   CBC with Differential (Cancer Center Only)    Standing Status:   Future    Standing Expiration Date:   01/13/2024  CMP (Cancer Center only)    Standing Status:   Future    Standing Expiration Date:   01/13/2024   Multiple Myeloma Panel (SPEP&IFE w/QIG)    Standing Status:   Future    Standing Expiration Date:   01/13/2024   Kappa/lambda light chains    Standing Status:   Future    Standing Expiration Date:   01/13/2024    Follow up  Lab Mag weekly x5  + IV mag.  6 weeks flex lab MD IV mag,  same labs  All questions were answered. The patient knows to call the clinic with any problems, questions or concerns.  Rickard Patience, MD, PhD 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   Initial bone marrow revealed 80% plasma cells.  Lambda free light chains were 1340.  She had nephrotic range proteinuria.  She initially underwent induction with RVD.  Revlimid maintenance was discontinued on 01/21/2017 secondary to intolerance.     07/19/2019 Bone marrow aspirate and biopsy on  revealed a normocellular marrow with but increased lambda-restricted plasma cells (9% aspirate, 40% CD138 immunohistochemistry).  Findings were consistent with recurrent plasma cell myeloma.  Flow cytometry revealed no monoclonal B-cell or phenotypically aberrant T-cell population. Cytogenetics were 45, XX (normal).  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    Lambda light chains have been followed: 22.2 (ratio 0.56) on 07/03/2017, 30.8 (ratio 0.78) on 09/02/2017, 36.9 (ratio 0.40) on 10/21/2017, 37.4 (ratio 0.41) on 12/16/2017, 70.7(ratio 0.31)  on 02/17/2018, 64.2 (ratio 0.27) on 04/07/2018, 78.9 (ratio 0.18) on 05/26/2018, 128.8 (ratio 0.17) on 08/06/2018, 181.5 (ratio 0.13) on 10/08/2018, 130.9 (ratio 0.13) on 10/20/2018, 160.7 (ratio 0.10) on 12/09/2018, 236.6 (ratio 0.07) on 02/01/2019, 363.6 (ratio 0.04) on 03/22/2019, 404.8 (ratio 0.04) on 04/05/2019, 420.7 (ratio 0.03) on 05/24/2019, 573.4 (ratio 0.03) on 06/23/2019, 451.05 (ratio 0.02) on 08/20/2019, 47.2 (ratio 0.13) on 10/25/2019, 22.4 (ratio 0.21) on 11/25/2019, 16.5 (ratio 0.33) on 01/10/2020, 14.6 (ratio 0.34) on 02/07/2020, 13.1 (ratio 0.31) on 03/07/2020, 10.1 (ratio 0.38) on 04/10/2020, and 9.5 (ratio 0.21) on 06/19/2020.   24 hour UPEP on 06/03/2019  revealed kappa free light chains 95.76, lambda free light chains 1,260.71, and ratio 0.08.  24 hour UPEP on 08/23/2019 revealed total protein of 782 mg/24 hrs with lambda free light chains 1,084.16 mg/L and ratio of 0.10 (1.03-31.76).  M spike in urine was 46.1% (361 mg/24 hrs).    Bone survey on 04/08/2016 and 05/28/2017 revealed no definite lytic lesion seen in the visualized skeleton.  Bone survey on 11/19/2018 revealed no suspicious lucent lesions and no acute bony abnormality.  PET scan on 07/12/2019 revealed no focal metabolic activity to suggest active myeloma within the skeleton. There were no lytic lesions identified on the CT portion of the exam or soft tissue plasmacytomas. There was no evidence of multiple myeloma.    Pretreatment RBC phenotype on 09/23/2019 was positive for C, e, DUFFY B, KIDD B, M, S, and s antigen; negative for c, E, KELL, DUFFY  A, KIDD A, and N antigen.    09/27/2019 - 10/25/2019; 12/09/2019 - 03/13/2020 6 cycles of daratumumab and hyaluronidase-fihj, Pomalyst, and Decadron (DPd) .  Cycle #1 was complicated by fever and neutropenia requiring admission.  Cycle #2 was complicated by pneumonia requiring admission.  Cycle #6 was complicated with an ER evaluation for an elevated lactic acid.   05/10/2020 second autologous stem cell transplant at [facility]   She underwent conditioning with melphalan 140 mg/m2.    She is s/p week #3 Velcade maintenance (began 08/31/2020 - 09/28/2020).  She has no increase in neuropathy.  She has severe aortic stenosis.  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 Cardiology is following for a possible TAVR in the future.  Echo on 07/05/2020 revealed moderate to severe aortic stenosis with an EF of 50-55%.  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 Zometa was discontinued in 01/2017.  She has chronic nausea on Phenergan.     B12 deficiency.  B12 was  254 on 04/09/2017, 295 on 08/20/2018, and 391 on 10/08/2018.  She was on oral B12.  She received B12 monthly (last 09/14/2020).  Folate was 12.1 on 01/10/2020.    She has iron deficiency.  Ferritin was 32 on 07/01/2019.  She received Venofer on 07/15/2019 and 07/22/2019.   She has hypogammaglobulinemia.  IgG was 245 on 11/25/2019.  She received monthly IVIG (07/22/021 - 03/22/2020).  She received IVIG 400 mg/kg on 01/20/2020, 200 mg/kg on 02/17/2020, and 300 mg/kg on 03/22/2020.  IVIG on 01/20/2020 was complicated by acute renal failure. IgG trough level was 418 on 02/17/2020.  10/15/2019 - 10/20/2019 She was admitted to [facility] with fever and neutropenia.  Cultures were negative.  CXR was negative. She received broad spectrum antibiotics and daily Granix.  She received IVF for acute renal insufficiency due to diarrhea and dehydration.  Creatine was 1.66 on admission and 1.12 on discharge.    03/01/2020 - 03/05/2020 admitted to [facility] from with fever and neutropenia. CXR revealed no active cardiopulmonary disease. Chest CT with contrast revealed no acute intrathoracic pathology. There were findings which could be suggestive of prior granulomatous disease. She was treated with Cefepime and Vancomycin, then switched to ciprofloxacin on 03/03/2020.   05/08/2020 - 12/05/2021The patient was admitted to [facility] from  for autologous bone marrow transplant.   She received conditioning with melphalan 140 mg/m2.  Bone marrow transplant was on 05/10/2020. The patient developed fevers daily from 05/19/2020 - 05/24/2020. Blood cultures were + for strept sanguis bacteremia.  She was treated cefepime, vancomycin, and solumedrol for possible engraftment syndrome. Cefepime was switched to ceftriaxone; she completed 2 weeks of ceftriaxone on 06/03/2020. She had chemotherapy induced diarrhea.  She experienced urinary retention.  Feb 2022 patient has been on maintenance Velcade 2 mg every 2 weeks.  Chronic diarrhea seen by  gastroenterology Dr. Tobi Bastos and was recommended to avoid artificial sugars in her diet including Diet Coke and coffee mate.  She feels some improvement of her diarrhea frequency since the dietary modification.  GI profile PCR negative, C. difficile toxin A+ B negative, fecal calprotectin 77 12/19/2020 colonoscopy showed 2 subcentimeter polyps in the ascending colon, resected and removed.  Pathology showed tubular adenoma.  No malignancy.  Normal mucosa in the entire examined colon.  Biopsied, pathology showed colonic mucosa with no significant pathological alteration.  Negative for active inflammation and features of chronicity.  Negative for microscopic colitis, dysplasia,  and malignanc  Diabetes, patient has discontinued metformin due to diarrhea and started on glipizide 2.5 mg   03/05/2021-03/09/2021 Patient was hospitalized due to fever and chills, nonproductive cough, shortness of breath. X-ray showed right lower lobe atelectasis versus pneumonia.  Blood cultures negative.  Urine culture showed 60,000 colonies of Enterococcus facialis.  Patient received antibiotics. Patient also had abdominal pelvis CT scan for work-up of intermittent lower abdomen pain.No acute abnormality.   05/14/21 last dose of Velcade maintenance.  establish care with neurology Dr. Theora Master and her neuropathy regimen has been switched from gabapentin to Lyrica. 05/29/2021 patient got influenza   neuropathy medication has been switched from gabapentin/Lyrica to nortriptyline.  08/18/2021 Hospitalized due to pneumonia from metapnuemovirus, hospitalization was complicated with NSTEMI. She received heparin gtt.  Treated with antibiotics for coverage of pneumonia,  diuretics for pulmonary edema. She received IVIG 400mg /kg x 1 for hypoglobulinemia. Discharged on 08/25/21 with Augmentin and Zithromax.   May 2023, Daratumumab being held for [facility] infectious disease physician Dr. Kennedy Bucker due to frequent infection.  Diagnosed with  CHF [HFrEF] -NYHA class II-III, she follows up with cardiology, started on entresto  hospitalization due to CHF exacerbation, community acquired pneumonia.   Presented to emergency room on 07/01/2022 Respiratory panel RT-PCR negative for COVID-19, influenza and RSV.   INTERVAL HISTORY Sarah [name] is a 67 y.o. female who has above history reviewed by me today presents for follow up visit for management of multiple myeloma.  Patient has been on weekly magnesium infusion as needed hypomagnesemia + CHF [nonischemic cardiomyopathy], aortic stenosis, follows up with cardiology.  Patient has no new complaints today.  Denies any infections since last visit.  Past Medical History:  Diagnosis Date   Anemia    Anxiety    Aortic stenosis    a. 05/2020 Echo: EF 45-50%, sev AS - seen by TAVR team @ [facility] - CTA sugg of tricuspid valve w/ fusing of 2 leaflets-TAVR deferred in setting of acute infxn; b. 07/2020 Echo: EF 50-55%, mod-sev AS; c. 12/2020 Echo: EF>55%. Mod-sev paradoxical low-flow low-gradient AS; d. 08/2021 Echo: EF 35-40%, severe AS, triv AI, mild MR.   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 Variable EF over time; b. 08/2017 Echo: EF 40%; b. 03/2020 Echo: EF 55-60%; c. 05/2020 Echo: EF 45-50%; d. 07/2020 Echo: EF 50-55%; e. 12/2020 Echo: EF>55%; f. 08/2021 Echo: EF 35-40%.   Chronic heart failure with preserved ejection fraction (HFpEF) (HCC)    a. Variable EF over time; b. 08/2017 Echo: EF 40%; b. 03/2020 Echo: EF 55-60%; c. 05/2020 Echo: EF 45-50%; d. 07/2020 Echo: EF 50-55%; e. 12/2020 Echo: EF>55%; f. 08/2021 Echo: EF 35-40%, no RV, mildly dil LA, mild MR, triv AI, severe AS.   CKD (chronic kidney disease) stage 3, GFR 30-59 ml/min (HCC)    Depression    Diabetes mellitus (HCC)    Dizziness    Fatty liver    Frequent falls    GERD (gastroesophageal reflux disease)     Gout    Heart murmur    History of blood transfusion    History of bone marrow transplant (HCC)    History of uterine fibroid    Hx of cardiac catheterization    a. 01/2016 Cath [facility] - after abnl nuc): Nl cors.   Hypertension    Hypomagnesemia    IDA (iron deficiency  anemia)    Multiple myeloma (HCC)    NSTEMI (non-ST elevated myocardial infarction) (HCC) 08/21/2021   Personal history of chemotherapy    Pleurisy 04/09/2022   PSVT (paroxysmal supraventricular tachycardia)    a. S/p RFCA [facility]).   PVC's (premature ventricular contractions)    a. Well-managed w/ bisoprolol in outpt setting.   Renal cyst     Past Surgical History:  Procedure Laterality Date   ABDOMINAL HYSTERECTOMY     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   CARDIAC ELECTROPHYSIOLOGY MAPPING AND ABLATION     CARPAL TUNNEL RELEASE Bilateral    CHOLECYSTECTOMY  2008   COLONOSCOPY WITH PROPOFOL N/A 05/07/2017   Procedure: COLONOSCOPY WITH PROPOFOL;  Surgeon: Wyline Mood, MD;  Location: [facility] ENDOSCOPY;  Service: Gastroenterology;  Laterality: N/A;   COLONOSCOPY WITH PROPOFOL N/A 12/19/2020   Procedure: COLONOSCOPY WITH PROPOFOL;  Surgeon: Wyline Mood, MD;  Location: [facility] ENDOSCOPY;  Service: Gastroenterology;  Laterality: N/A;   ESOPHAGOGASTRODUODENOSCOPY (EGD) WITH PROPOFOL N/A 05/07/2017   Procedure: ESOPHAGOGASTRODUODENOSCOPY (EGD) WITH PROPOFOL;  Surgeon: Wyline Mood, MD;  Location: [facility] ENDOSCOPY;  Service: Gastroenterology;  Laterality: N/A;   FOOT SURGERY Bilateral    INCONTINENCE SURGERY  2009   INTERSTIM IMPLANT PLACEMENT     other     over active bladder   OTHER SURGICAL HISTORY     bladder stimulator    PARTIAL HYSTERECTOMY  03/1996   fibroids   PORTA CATH INSERTION N/A 03/10/2019   Procedure: PORTA CATH INSERTION;  Surgeon: Annice Needy, MD;  Location: [facility] INVASIVE CV LAB;  Service: Cardiovascular;  Laterality: N/A;   TONSILLECTOMY  2007    Family History  Problem Relation Age of Onset    Colon cancer Father    Renal Disease Father    Diabetes Mellitus II Father    Melanoma Paternal Grandmother    Breast cancer Maternal Aunt 43   Anemia Mother    Heart disease Mother    Heart failure Mother    Renal Disease Mother    Congestive Heart Failure Mother    Heart disease Maternal Uncle    Throat cancer Maternal Uncle    Lung cancer Maternal Uncle    Liver disease Maternal Uncle    Heart failure Maternal Uncle    Hearing loss Son 71       Suicide     Social History:  reports that she quit smoking about 31 years ago. Her smoking use included cigarettes. She started smoking about 51 years ago. She has a 20 pack-year smoking history. She has never used smokeless tobacco. She reports current alcohol use of about 2.0 standard drinks of alcohol per week. She reports that she does not use drugs.  She is on disability. She notes exposure to perchloroethylene [facility]).   Allergies:  Allergies  Allergen Reactions   Oxycodone-Acetaminophen Anaphylaxis    Swelling and rash   Celebrex [Celecoxib] Diarrhea   Codeine    Plerixafor     In 2016 during ASCT collection patient developed fever to 103.42F and required hospitalization   Benadryl [Diphenhydramine] Palpitations   Morphine Itching and Rash   Ondansetron Diarrhea   Tylenol [Acetaminophen] Itching and Rash    Current Medications: Current Outpatient Medications  Medication Sig Dispense Refill   allopurinol (ZYLOPRIM) 100 MG tablet TAKE 1 TABLET(100 MG) BY MOUTH DAILY as needed 90 tablet 1   amiodarone (PACERONE) 200 MG tablet Take 200 mg by mouth daily.     atorvastatin (LIPITOR) 10 MG tablet Take  0.5 tablets (5 mg total) by mouth daily. 45 tablet 1   bisoprolol (ZEBETA) 10 MG tablet Take 1 tablet (10 mg total) by mouth daily. 90 tablet 1   calcium carbonate (OS-CAL) 1250 (500 Ca) MG chewable tablet Chew 2 tablets by mouth daily.     diazepam (VALIUM) 5 MG tablet Take 0.5-1 tablets (2.5-5 mg total) by mouth at bedtime as needed  (insomnia). 30 tablet 0   diclofenac sodium (VOLTAREN) 1 % GEL Apply 2 g topically 4 (four) times daily. 100 g 1   diphenoxylate-atropine (LOMOTIL) 2.5-0.025 MG tablet Take 2 tablets by mouth 4 (four) times daily as needed for diarrhea or loose stools. 180 tablet 3   empagliflozin (JARDIANCE) 10 MG TABS tablet Take 1 tablet by mouth daily.     ENTRESTO 24-26 MG Take 1 tablet by mouth 2 (two) times daily.     furosemide (LASIX) 40 MG tablet Take 0.5 tablets (20 mg total) by mouth daily. Home dose.     glipiZIDE (GLUCOTROL XL) 2.5 MG 24 hr tablet Take 1 tablet (2.5 mg total) by mouth daily with breakfast. 90 tablet 1   glucose blood test strip      lidocaine (XYLOCAINE) 2 % solution Use as directed 15 mLs in the mouth or throat every 3 (three) hours as needed for mouth pain (swish and spit). 100 mL 0   magnesium chloride (SLOW-MAG) 64 MG TBEC SR tablet Take 1 tablet (64 mg total) by mouth daily. 60 tablet 1   Multiple Vitamin (MULTIVITAMIN) tablet Take 1 tablet by mouth daily.     omeprazole (PRILOSEC) 40 MG capsule TAKE 1 CAPSULE IN THE MORNING AND TAKE 1 CAPSULE AT BEDTIME 180 capsule 1   pentoxifylline (TRENTAL) 400 MG CR tablet Take 400 mg by mouth 3 (three) times daily with meals.     potassium chloride SA (KLOR-CON M) 20 MEQ tablet Take 1 tablet (20 mEq total) by mouth daily. Home med, reduced from twice daily to daily. (Patient taking differently: Take 20 mEq by mouth daily as needed.)  0   promethazine (PHENERGAN) 25 MG tablet Take 1 tablet (25 mg total) by mouth every 6 (six) hours as needed for nausea or vomiting. 90 tablet 1   spironolactone (ALDACTONE) 25 MG tablet Take by mouth.     tiZANidine (ZANAFLEX) 4 MG tablet Take 1 tablet (4 mg total) by mouth every 6 (six) hours as needed for muscle spasms. 90 tablet 0   traMADol (ULTRAM) 50 MG tablet      traZODone (DESYREL) 100 MG tablet TAKE 1 TABLET AT BEDTIME 90 tablet 3   venlafaxine (EFFEXOR) 75 MG tablet Take 75 mg by mouth daily.      vitamin E 45 MG (100 UNITS) capsule Take by mouth daily.     No current facility-administered medications for this visit.   Facility-Administered Medications Ordered in Other Visits  Medication Dose Route Frequency Provider Last Rate Last Admin   heparin lock flush 100 unit/mL  500 Units Intracatheter Once PRN Rickard Patience, MD       sodium chloride flush (NS) 0.9 % injection 10 mL  10 mL Intravenous PRN Rickard Patience, MD   10 mL at 04/02/21 0904   sodium chloride flush (NS) 0.9 % injection 10 mL  10 mL Intracatheter Once PRN Rickard Patience, MD        Review of Systems  Constitutional:  Negative for chills, fever, malaise/fatigue and weight loss.  HENT:  Negative for sore throat.  Eyes:  Negative for redness.  Respiratory:  Negative for cough, shortness of breath and wheezing.   Cardiovascular:  Negative for chest pain, palpitations and leg swelling.  Gastrointestinal:  Positive for diarrhea. Negative for abdominal pain, blood in stool, nausea and vomiting.  Genitourinary:  Negative for dysuria.  Musculoskeletal:  Positive for back pain and joint pain. Negative for myalgias.  Skin:  Negative for rash.  Neurological:  Positive for tingling and sensory change. Negative for dizziness and tremors.  Endo/Heme/Allergies:  Does not bruise/bleed easily.  Psychiatric/Behavioral:  Negative for hallucinations.     Performance status (ECOG): 1  Vitals Blood pressure 129/74, pulse 62, temperature (!) 96.5 F (35.8 C), temperature source Tympanic, resp. rate 18, weight 153 lb 8 oz (69.6 kg), SpO2 98%.  Physical Exam Constitutional:      General: She is not in acute distress.    Appearance: She is not diaphoretic.  HENT:     Head: Normocephalic and atraumatic.  Eyes:     General: No scleral icterus.    Pupils: Pupils are equal, round, and reactive to light.  Cardiovascular:     Rate and Rhythm: Normal rate.     Heart sounds: Murmur heard.  Pulmonary:     Effort: Pulmonary effort is normal. No  respiratory distress.     Breath sounds: No rales.  Chest:     Chest wall: No tenderness.  Abdominal:     General: There is no distension.  Musculoskeletal:        General: Normal range of motion.     Cervical back: Normal range of motion and neck supple.  Skin:    General: Skin is warm and dry.     Findings: No erythema.     Comments: Lower extremity cat bite cellulitis has improved.   Neurological:     Mental Status: She is alert and oriented to person, place, and time.     Cranial Nerves: No cranial nerve deficit.     Motor: No abnormal muscle tone.     Coordination: Coordination normal.  Psychiatric:        Mood and Affect: Mood and affect normal.      Laboratory data    Latest Ref Rng & Units 01/13/2023    9:38 AM 12/02/2022    8:41 AM 10/21/2022    9:29 AM  CBC  WBC 4.0 - 10.5 K/uL 7.7  5.2  6.5   Hemoglobin 12.0 - 15.0 g/dL 32.3  55.7  32.2   Hematocrit 36.0 - 46.0 % 36.8  37.1  37.8   Platelets 150 - 400 K/uL 145  126  127       Latest Ref Rng & Units 01/13/2023    9:38 AM 12/23/2022    9:02 AM 12/02/2022    8:41 AM  CMP  Glucose 70 - 99 mg/dL 025  427  062   BUN 8 - 23 mg/dL 21  27  35   Creatinine 0.44 - 1.00 mg/dL 3.76  2.83  1.51   Sodium 135 - 145 mmol/L 137  138  137   Potassium 3.5 - 5.1 mmol/L 4.0  4.0  4.0   Chloride 98 - 111 mmol/L 106  107  108   CO2 22 - 32 mmol/L 21  21  23    Calcium 8.9 - 10.3 mg/dL 8.6  8.7  8.5   Total Protein 6.5 - 8.1 g/dL 7.4   7.4   Total Bilirubin 0.3 - 1.2 mg/dL 0.4  0.3   Alkaline Phos 38 - 126 U/L 79   85   AST 15 - 41 U/L 29   23   ALT 0 - 44 U/L 26   20

## 2023-01-14 ENCOUNTER — Ambulatory Visit (INDEPENDENT_AMBULATORY_CARE_PROVIDER_SITE_OTHER): Payer: Medicare PPO | Admitting: Podiatry

## 2023-01-14 DIAGNOSIS — M79674 Pain in right toe(s): Secondary | ICD-10-CM

## 2023-01-14 DIAGNOSIS — M79675 Pain in left toe(s): Secondary | ICD-10-CM

## 2023-01-14 DIAGNOSIS — B351 Tinea unguium: Secondary | ICD-10-CM

## 2023-01-14 DIAGNOSIS — C9000 Multiple myeloma not having achieved remission: Secondary | ICD-10-CM

## 2023-01-14 LAB — KAPPA/LAMBDA LIGHT CHAINS
Kappa free light chain: 20.5 mg/L — ABNORMAL HIGH (ref 3.3–19.4)
Kappa, lambda light chain ratio: 0.87 (ref 0.26–1.65)
Lambda free light chains: 23.6 mg/L (ref 5.7–26.3)

## 2023-01-14 NOTE — Progress Notes (Signed)
Pt felt dizzy and nauseous upon standing up to leave her session. She sat down and drank water. Blood pressure and heart rate were within normal ranges. Skin was warm and dry. After sitting for ten minutes symptoms relieved. Walked with patient to their vehicle.

## 2023-01-14 NOTE — Progress Notes (Signed)
Daily Session Note  Patient Details  Name: Sarah [name] MRN: [id number] Date of Birth: 09/20/1955 Referring Provider:   Flowsheet Row Cardiac Rehab from 08/29/2022 in [facility]  Referring Provider Joneen Roach, MD       Encounter Date: 01/14/2023  Check In:  Session Check In - 01/14/23 1228       Check-In   Supervising physician immediately available to respond to emergencies See telemetry face sheet for immediately available ER MD    Location [facility] Pul[facility]    Staff Present Ronette Deter, BS, Exercise Physiologist;Susanne Bice, RN, BSN, CCRP    Virtual Visit No    Medication changes reported     No    Fall or balance concerns reported    No    Warm-up and Cool-down Performed on first and last piece of equipment    Resistance Training Performed Yes    VAD Patient? No    PAD/SET Patient? No      Pain Assessment   Currently in Pain? No/denies    Multiple Pain Sites No                Social History   Tobacco Use  Smoking Status Former   Current packs/day: 0.00   Average packs/day: 1 pack/day for 20.0 years (20.0 ttl pk-yrs)   Types: Cigarettes   Start date: 07/02/1971   Quit date: 07/02/1991   Years since quitting: 31.5  Smokeless Tobacco Never    Goals Met:  Independence with exercise equipment Exercise tolerated well No report of concerns or symptoms today  Goals Unmet:  Not Applicable  Comments: Pt able to follow exercise prescription today without complaint.  Will continue to monitor for progression.    Dr. Bethann Punches is Medical Director for [facility].  Dr. Vida Rigger is Medical Director for [facility].

## 2023-01-14 NOTE — Progress Notes (Signed)
  Subjective:  Patient ID: Sarah [name], female    DOB: May 20, 1956,  MRN: [id number]  Chief Complaint  Patient presents with   Diabetes    Foot exam    67 y.o. female returns for the above complaint.  Patient presents with thickened and again dystrophic mycotic toenails x 10 mild pain on palpation hurts with ambulation worse with pressure she would like for me to debride down her nails that she is not able to do it herself  Objective:  There were no vitals filed for this visit. Podiatric Exam: Vascular: dorsalis pedis and posterior tibial pulses are palpable bilateral. Capillary return is immediate. Temperature gradient is WNL. Skin turgor WNL  Sensorium: Normal Semmes Weinstein monofilament test. Normal tactile sensation bilaterally. Nail Exam: Pt has thick disfigured discolored nails with subungual debris noted bilateral entire nail hallux through fifth toenails.  Pain on palpation to the nails. Ulcer Exam: There is no evidence of ulcer or pre-ulcerative changes or infection. Orthopedic Exam: Muscle tone and strength are WNL. No limitations in general ROM. No crepitus or effusions noted.  Skin: No Porokeratosis. No infection or ulcers    Assessment & Plan:   1. Pain due to onychomycosis of toenails of both feet     Patient was evaluated and treated and all questions answered.  Onychomycosis with pain  -Nails palliatively debrided as below. -Educated on self-care  Procedure: Nail Debridement Rationale: pain  Type of Debridement: manual, sharp debridement. Instrumentation: Nail nipper, rotary burr. Number of Nails: 10  Procedures and Treatment: Consent by patient was obtained for treatment procedures. The patient understood the discussion of treatment and procedures well. All questions were answered thoroughly reviewed. Debridement of mycotic and hypertrophic toenails, 1 through 5 bilateral and clearing of subungual debris. No ulceration, no infection noted.  Return  Visit-Office Procedure: Patient instructed to return to the office for a follow up visit 3 months for continued evaluation and treatment.  Nicholes Rough, DPM    No follow-ups on file.

## 2023-01-16 LAB — MULTIPLE MYELOMA PANEL, SERUM
Albumin SerPl Elph-Mcnc: 3.9 g/dL (ref 2.9–4.4)
Albumin/Glob SerPl: 1.3 (ref 0.7–1.7)
Alpha 1: 0.3 g/dL (ref 0.0–0.4)
Alpha2 Glob SerPl Elph-Mcnc: 0.9 g/dL (ref 0.4–1.0)
B-Globulin SerPl Elph-Mcnc: 1.1 g/dL (ref 0.7–1.3)
Gamma Glob SerPl Elph-Mcnc: 0.8 g/dL (ref 0.4–1.8)
Globulin, Total: 3.1 g/dL (ref 2.2–3.9)
IgA: 260 mg/dL (ref 87–352)
IgG (Immunoglobin G), Serum: 820 mg/dL (ref 586–1602)
IgM (Immunoglobulin M), Srm: 36 mg/dL (ref 26–217)
Total Protein ELP: 7 g/dL (ref 6.0–8.5)

## 2023-01-20 ENCOUNTER — Ambulatory Visit
Admission: RE | Admit: 2023-01-20 | Discharge: 2023-01-20 | Disposition: A | Discharge status: Home / Self Care | Payer: Medicare PPO | Source: Ambulatory Visit | Attending: Oncology | Admitting: Oncology

## 2023-01-20 DIAGNOSIS — C9000 Multiple myeloma not having achieved remission: Secondary | ICD-10-CM | POA: Diagnosis not present

## 2023-01-20 DIAGNOSIS — C9001 Multiple myeloma in remission: Secondary | ICD-10-CM | POA: Insufficient documentation

## 2023-01-20 DIAGNOSIS — M5126 Other intervertebral disc displacement, lumbar region: Secondary | ICD-10-CM | POA: Diagnosis not present

## 2023-01-20 DIAGNOSIS — M2548 Effusion, other site: Secondary | ICD-10-CM | POA: Diagnosis not present

## 2023-01-20 DIAGNOSIS — M47814 Spondylosis without myelopathy or radiculopathy, thoracic region: Secondary | ICD-10-CM | POA: Diagnosis not present

## 2023-01-20 DIAGNOSIS — M5134 Other intervertebral disc degeneration, thoracic region: Secondary | ICD-10-CM | POA: Diagnosis not present

## 2023-01-20 DIAGNOSIS — M549 Dorsalgia, unspecified: Secondary | ICD-10-CM | POA: Diagnosis not present

## 2023-01-20 DIAGNOSIS — M5136 Other intervertebral disc degeneration, lumbar region: Secondary | ICD-10-CM | POA: Diagnosis not present

## 2023-01-20 DIAGNOSIS — M48061 Spinal stenosis, lumbar region without neurogenic claudication: Secondary | ICD-10-CM | POA: Diagnosis not present

## 2023-01-20 MED ORDER — HEPARIN SOD (PORK) LOCK FLUSH 100 UNIT/ML IV SOLN
500.0000 [IU] | Freq: Once | INTRAVENOUS | Status: AC
  Administered 2023-01-20: 500 [IU] via INTRAVENOUS
  Filled 2023-01-20: qty 5

## 2023-01-20 MED ORDER — HEPARIN SOD (PORK) LOCK FLUSH 100 UNIT/ML IV SOLN
INTRAVENOUS | Status: AC
  Filled 2023-01-20: qty 5

## 2023-01-20 MED ORDER — GADOBUTROL 1 MMOL/ML IV SOLN
6.0000 mL | Freq: Once | INTRAVENOUS | Status: AC | PRN
  Administered 2023-01-20: 6 mL via INTRAVENOUS

## 2023-01-22 ENCOUNTER — Inpatient Hospital Stay: Payer: Medicare PPO

## 2023-01-22 ENCOUNTER — Other Ambulatory Visit: Payer: Self-pay | Admitting: Internal Medicine

## 2023-01-22 VITALS — BP 126/70 | HR 58 | Temp 96.3°F | Resp 20

## 2023-01-22 DIAGNOSIS — F5101 Primary insomnia: Secondary | ICD-10-CM

## 2023-01-22 DIAGNOSIS — D702 Other drug-induced agranulocytosis: Secondary | ICD-10-CM | POA: Diagnosis not present

## 2023-01-22 DIAGNOSIS — Z95828 Presence of other vascular implants and grafts: Secondary | ICD-10-CM

## 2023-01-22 DIAGNOSIS — G629 Polyneuropathy, unspecified: Secondary | ICD-10-CM | POA: Diagnosis not present

## 2023-01-22 DIAGNOSIS — I509 Heart failure, unspecified: Secondary | ICD-10-CM | POA: Diagnosis not present

## 2023-01-22 DIAGNOSIS — D801 Nonfamilial hypogammaglobulinemia: Secondary | ICD-10-CM | POA: Diagnosis not present

## 2023-01-22 DIAGNOSIS — C9001 Multiple myeloma in remission: Secondary | ICD-10-CM | POA: Diagnosis not present

## 2023-01-22 DIAGNOSIS — T451X5A Adverse effect of antineoplastic and immunosuppressive drugs, initial encounter: Secondary | ICD-10-CM | POA: Diagnosis not present

## 2023-01-22 DIAGNOSIS — D500 Iron deficiency anemia secondary to blood loss (chronic): Secondary | ICD-10-CM | POA: Diagnosis not present

## 2023-01-22 DIAGNOSIS — E538 Deficiency of other specified B group vitamins: Secondary | ICD-10-CM | POA: Diagnosis not present

## 2023-01-22 LAB — MAGNESIUM: Magnesium: 1.3 mg/dL — ABNORMAL LOW (ref 1.7–2.4)

## 2023-01-22 MED ORDER — SODIUM CHLORIDE 0.9 % IV SOLN
25.0000 mg | Freq: Once | INTRAVENOUS | Status: AC
  Administered 2023-01-22: 25 mg via INTRAVENOUS
  Filled 2023-01-22: qty 1

## 2023-01-22 MED ORDER — HEPARIN SOD (PORK) LOCK FLUSH 100 UNIT/ML IV SOLN
500.0000 [IU] | Freq: Once | INTRAVENOUS | Status: DC | PRN
  Filled 2023-01-22: qty 5

## 2023-01-22 MED ORDER — MAGNESIUM SULFATE 2 GM/50ML IV SOLN
2.0000 g | Freq: Once | INTRAVENOUS | Status: AC
  Administered 2023-01-22: 2 g via INTRAVENOUS
  Filled 2023-01-22: qty 50

## 2023-01-22 MED ORDER — MAGNESIUM SULFATE 4 GM/100ML IV SOLN
4.0000 g | Freq: Once | INTRAVENOUS | Status: AC
  Administered 2023-01-22: 4 g via INTRAVENOUS
  Filled 2023-01-22: qty 100

## 2023-01-22 MED ORDER — SODIUM CHLORIDE 0.9% FLUSH
10.0000 mL | Freq: Once | INTRAVENOUS | Status: AC | PRN
  Administered 2023-01-22: 10 mL
  Filled 2023-01-22: qty 10

## 2023-01-22 MED ORDER — SODIUM CHLORIDE 0.9 % IV SOLN
Freq: Once | INTRAVENOUS | Status: AC
  Filled 2023-01-22: qty 250

## 2023-01-22 MED ORDER — HEPARIN SOD (PORK) LOCK FLUSH 100 UNIT/ML IV SOLN
500.0000 [IU] | Freq: Once | INTRAVENOUS | Status: AC
  Administered 2023-01-22: 500 [IU]
  Filled 2023-01-22: qty 5

## 2023-01-22 NOTE — Patient Instructions (Signed)
Hypomagnesemia Hypomagnesemia is a condition in which the level of magnesium in the blood is too low. Magnesium is a mineral that is found in many foods. It is used in many different processes in the body. Hypomagnesemia can affect every organ in the body. In severe cases, it can cause life-threatening problems. What are the causes? This condition may be caused by: Not getting enough magnesium in your diet or not having enough healthy foods to eat (malnutrition). Problems with magnesium absorption in the intestines. Dehydration. Excessive use of alcohol. Vomiting. Severe or long-term (chronic) diarrhea. Some medicines, including medicines that make you urinate more often (diuretics). Certain diseases, such as kidney disease, diabetes, celiac disease, and overactive thyroid. What are the signs or symptoms? Symptoms of this condition include: Loss of appetite, nausea, and vomiting. Involuntary shaking or trembling of a body part (tremor). Muscle weakness or tingling in the arms and legs. Sudden tightening of muscles (muscle spasms). Confusion. Psychiatric issues, such as: Depression and irritability. Psychosis. A feeling of fluttering of the heart (palpitations). Seizures. These symptoms are more severe if magnesium levels drop suddenly. How is this diagnosed? This condition may be diagnosed based on: Your symptoms and medical history. A physical exam. Blood and urine tests. How is this treated? Treatment depends on the cause and the severity of the condition. It may be treated by: Taking a magnesium supplement. This can be taken in pill form. If the condition is severe, magnesium is usually given through an IV. Making changes to your diet. You may be directed to eat foods that have a lot of magnesium, such as green leafy vegetables, peas, beans, and nuts. Not drinking alcohol. If you are struggling not to drink, ask your health care provider for help. Follow these instructions at  home: Eating and drinking     Make sure that your diet includes foods with magnesium. Foods that have a lot of magnesium in them include: Green leafy vegetables, such as spinach and broccoli. Beans and peas. Nuts and seeds, such as almonds and sunflower seeds. Whole grains, such as whole grain bread and fortified cereals. Drink fluids that contain salts and minerals (electrolytes), such as sports drinks, when you are active. Do not drink alcohol. General instructions Take over-the-counter and prescription medicines only as told by your health care provider. Take magnesium supplements as directed if your health care provider tells you to take them. Have your magnesium levels monitored as told by your health care provider. Keep all follow-up visits. This is important. Contact a health care provider if: You get worse instead of better. Your symptoms return. Get help right away if: You develop severe muscle weakness. You have trouble breathing. You feel that your heart is racing. These symptoms may represent a serious problem that is an emergency. Do not wait to see if the symptoms will go away. Get medical help right away. Call your local emergency services (911 in the [place] Do not drive yourself to the hospital. Summary Hypomagnesemia is a condition in which the level of magnesium in the blood is too low. Hypomagnesemia can affect every organ in the body. Treatment may include eating more foods that contain magnesium, taking magnesium supplements, and not drinking alcohol. Have your magnesium levels monitored as told by your health care provider. This information is not intended to replace advice given to you by your health care provider. Make sure you discuss any questions you have with your health care provider. Document Revised: 11/14/2020 Document Reviewed: 11/14/2020 [employer] Patient Education    2024 [employer]  

## 2023-01-23 NOTE — Telephone Encounter (Signed)
Please review.  KP

## 2023-01-24 MED ORDER — DIAZEPAM 5 MG PO TABS
2.5000 mg | ORAL_TABLET | Freq: Every evening | ORAL | 0 refills | Status: DC | PRN
Start: 2023-01-24 — End: 2023-03-07

## 2023-01-27 ENCOUNTER — Inpatient Hospital Stay: Payer: Medicare PPO

## 2023-01-27 VITALS — BP 103/59 | HR 74 | Temp 97.6°F | Resp 18

## 2023-01-27 DIAGNOSIS — E538 Deficiency of other specified B group vitamins: Secondary | ICD-10-CM | POA: Diagnosis not present

## 2023-01-27 DIAGNOSIS — D500 Iron deficiency anemia secondary to blood loss (chronic): Secondary | ICD-10-CM | POA: Diagnosis not present

## 2023-01-27 DIAGNOSIS — C9001 Multiple myeloma in remission: Secondary | ICD-10-CM

## 2023-01-27 DIAGNOSIS — D702 Other drug-induced agranulocytosis: Secondary | ICD-10-CM | POA: Diagnosis not present

## 2023-01-27 DIAGNOSIS — I509 Heart failure, unspecified: Secondary | ICD-10-CM | POA: Diagnosis not present

## 2023-01-27 DIAGNOSIS — G629 Polyneuropathy, unspecified: Secondary | ICD-10-CM | POA: Diagnosis not present

## 2023-01-27 DIAGNOSIS — T451X5A Adverse effect of antineoplastic and immunosuppressive drugs, initial encounter: Secondary | ICD-10-CM | POA: Diagnosis not present

## 2023-01-27 DIAGNOSIS — D801 Nonfamilial hypogammaglobulinemia: Secondary | ICD-10-CM | POA: Diagnosis not present

## 2023-01-27 LAB — MAGNESIUM: Magnesium: 1.5 mg/dL — ABNORMAL LOW (ref 1.7–2.4)

## 2023-01-27 MED ORDER — SODIUM CHLORIDE 0.9% FLUSH
10.0000 mL | Freq: Once | INTRAVENOUS | Status: AC | PRN
  Administered 2023-01-27: 10 mL
  Filled 2023-01-27: qty 10

## 2023-01-27 MED ORDER — SODIUM CHLORIDE 0.9 % IV SOLN
Freq: Once | INTRAVENOUS | Status: AC
  Filled 2023-01-27: qty 250

## 2023-01-27 MED ORDER — SODIUM CHLORIDE 0.9 % IV SOLN
25.0000 mg | Freq: Once | INTRAVENOUS | Status: AC
  Administered 2023-01-27: 25 mg via INTRAVENOUS
  Filled 2023-01-27: qty 1

## 2023-01-27 MED ORDER — MAGNESIUM SULFATE 4 GM/100ML IV SOLN
4.0000 g | Freq: Once | INTRAVENOUS | Status: AC
  Administered 2023-01-27: 4 g via INTRAVENOUS
  Filled 2023-01-27: qty 100

## 2023-01-27 MED ORDER — HEPARIN SOD (PORK) LOCK FLUSH 100 UNIT/ML IV SOLN
500.0000 [IU] | Freq: Once | INTRAVENOUS | Status: AC | PRN
  Administered 2023-01-27: 500 [IU]
  Filled 2023-01-27: qty 5

## 2023-02-03 ENCOUNTER — Inpatient Hospital Stay: Payer: Medicare PPO

## 2023-02-03 ENCOUNTER — Inpatient Hospital Stay: Payer: Medicare PPO | Attending: Oncology

## 2023-02-03 VITALS — BP 116/70 | HR 60 | Temp 96.9°F | Resp 20

## 2023-02-03 DIAGNOSIS — Z90710 Acquired absence of both cervix and uterus: Secondary | ICD-10-CM | POA: Diagnosis not present

## 2023-02-03 DIAGNOSIS — I5032 Chronic diastolic (congestive) heart failure: Secondary | ICD-10-CM | POA: Insufficient documentation

## 2023-02-03 DIAGNOSIS — Z841 Family history of disorders of kidney and ureter: Secondary | ICD-10-CM | POA: Insufficient documentation

## 2023-02-03 DIAGNOSIS — Z8249 Family history of ischemic heart disease and other diseases of the circulatory system: Secondary | ICD-10-CM | POA: Insufficient documentation

## 2023-02-03 DIAGNOSIS — Z833 Family history of diabetes mellitus: Secondary | ICD-10-CM | POA: Insufficient documentation

## 2023-02-03 DIAGNOSIS — R197 Diarrhea, unspecified: Secondary | ICD-10-CM | POA: Insufficient documentation

## 2023-02-03 DIAGNOSIS — Z803 Family history of malignant neoplasm of breast: Secondary | ICD-10-CM | POA: Diagnosis not present

## 2023-02-03 DIAGNOSIS — Z886 Allergy status to analgesic agent status: Secondary | ICD-10-CM | POA: Diagnosis not present

## 2023-02-03 DIAGNOSIS — C9001 Multiple myeloma in remission: Secondary | ICD-10-CM | POA: Insufficient documentation

## 2023-02-03 DIAGNOSIS — Z822 Family history of deafness and hearing loss: Secondary | ICD-10-CM | POA: Insufficient documentation

## 2023-02-03 DIAGNOSIS — G620 Drug-induced polyneuropathy: Secondary | ICD-10-CM | POA: Diagnosis not present

## 2023-02-03 DIAGNOSIS — I350 Nonrheumatic aortic (valve) stenosis: Secondary | ICD-10-CM | POA: Insufficient documentation

## 2023-02-03 DIAGNOSIS — Z79899 Other long term (current) drug therapy: Secondary | ICD-10-CM | POA: Diagnosis not present

## 2023-02-03 DIAGNOSIS — N1832 Chronic kidney disease, stage 3b: Secondary | ICD-10-CM | POA: Diagnosis not present

## 2023-02-03 DIAGNOSIS — Z801 Family history of malignant neoplasm of trachea, bronchus and lung: Secondary | ICD-10-CM | POA: Insufficient documentation

## 2023-02-03 DIAGNOSIS — Z9049 Acquired absence of other specified parts of digestive tract: Secondary | ICD-10-CM | POA: Insufficient documentation

## 2023-02-03 DIAGNOSIS — D500 Iron deficiency anemia secondary to blood loss (chronic): Secondary | ICD-10-CM | POA: Insufficient documentation

## 2023-02-03 DIAGNOSIS — E1122 Type 2 diabetes mellitus with diabetic chronic kidney disease: Secondary | ICD-10-CM | POA: Diagnosis not present

## 2023-02-03 DIAGNOSIS — E538 Deficiency of other specified B group vitamins: Secondary | ICD-10-CM | POA: Diagnosis not present

## 2023-02-03 DIAGNOSIS — Z885 Allergy status to narcotic agent status: Secondary | ICD-10-CM | POA: Insufficient documentation

## 2023-02-03 DIAGNOSIS — I252 Old myocardial infarction: Secondary | ICD-10-CM | POA: Insufficient documentation

## 2023-02-03 DIAGNOSIS — Z87891 Personal history of nicotine dependence: Secondary | ICD-10-CM | POA: Diagnosis not present

## 2023-02-03 DIAGNOSIS — Z800 Family history of malignant neoplasm of digestive organs: Secondary | ICD-10-CM | POA: Diagnosis not present

## 2023-02-03 DIAGNOSIS — Z832 Family history of diseases of the blood and blood-forming organs and certain disorders involving the immune mechanism: Secondary | ICD-10-CM | POA: Insufficient documentation

## 2023-02-03 DIAGNOSIS — T451X5A Adverse effect of antineoplastic and immunosuppressive drugs, initial encounter: Secondary | ICD-10-CM | POA: Diagnosis not present

## 2023-02-03 DIAGNOSIS — Z888 Allergy status to other drugs, medicaments and biological substances status: Secondary | ICD-10-CM | POA: Diagnosis not present

## 2023-02-03 DIAGNOSIS — I130 Hypertensive heart and chronic kidney disease with heart failure and stage 1 through stage 4 chronic kidney disease, or unspecified chronic kidney disease: Secondary | ICD-10-CM | POA: Insufficient documentation

## 2023-02-03 DIAGNOSIS — Z808 Family history of malignant neoplasm of other organs or systems: Secondary | ICD-10-CM | POA: Insufficient documentation

## 2023-02-03 LAB — MAGNESIUM: Magnesium: 1.2 mg/dL — ABNORMAL LOW (ref 1.7–2.4)

## 2023-02-03 MED ORDER — MAGNESIUM SULFATE 4 GM/100ML IV SOLN
4.0000 g | Freq: Once | INTRAVENOUS | Status: AC
  Administered 2023-02-03: 4 g via INTRAVENOUS

## 2023-02-03 MED ORDER — SODIUM CHLORIDE 0.9 % IV SOLN
Freq: Once | INTRAVENOUS | Status: AC
  Filled 2023-02-03: qty 250

## 2023-02-03 MED ORDER — SODIUM CHLORIDE 0.9 % IV SOLN
25.0000 mg | Freq: Once | INTRAVENOUS | Status: AC
  Administered 2023-02-03: 25 mg via INTRAVENOUS
  Filled 2023-02-03: qty 1

## 2023-02-03 MED ORDER — MAGNESIUM SULFATE 2 GM/50ML IV SOLN
2.0000 g | Freq: Once | INTRAVENOUS | Status: AC
  Administered 2023-02-03: 2 g via INTRAVENOUS
  Filled 2023-02-03: qty 50

## 2023-02-03 MED ORDER — SODIUM CHLORIDE 0.9% FLUSH
10.0000 mL | Freq: Once | INTRAVENOUS | Status: AC | PRN
  Administered 2023-02-03: 10 mL
  Filled 2023-02-03: qty 10

## 2023-02-03 MED ORDER — HEPARIN SOD (PORK) LOCK FLUSH 100 UNIT/ML IV SOLN
500.0000 [IU] | Freq: Once | INTRAVENOUS | Status: AC | PRN
  Administered 2023-02-03: 500 [IU]
  Filled 2023-02-03: qty 5

## 2023-02-05 DIAGNOSIS — R5383 Other fatigue: Secondary | ICD-10-CM | POA: Diagnosis not present

## 2023-02-05 DIAGNOSIS — E1122 Type 2 diabetes mellitus with diabetic chronic kidney disease: Secondary | ICD-10-CM | POA: Diagnosis not present

## 2023-02-05 DIAGNOSIS — I5042 Chronic combined systolic (congestive) and diastolic (congestive) heart failure: Secondary | ICD-10-CM | POA: Diagnosis not present

## 2023-02-05 DIAGNOSIS — I447 Left bundle-branch block, unspecified: Secondary | ICD-10-CM | POA: Diagnosis not present

## 2023-02-05 DIAGNOSIS — N189 Chronic kidney disease, unspecified: Secondary | ICD-10-CM | POA: Diagnosis not present

## 2023-02-05 DIAGNOSIS — I2510 Atherosclerotic heart disease of native coronary artery without angina pectoris: Secondary | ICD-10-CM | POA: Diagnosis not present

## 2023-02-05 DIAGNOSIS — I350 Nonrheumatic aortic (valve) stenosis: Secondary | ICD-10-CM | POA: Diagnosis not present

## 2023-02-05 DIAGNOSIS — I493 Ventricular premature depolarization: Secondary | ICD-10-CM | POA: Diagnosis not present

## 2023-02-05 DIAGNOSIS — Z885 Allergy status to narcotic agent status: Secondary | ICD-10-CM | POA: Diagnosis not present

## 2023-02-05 DIAGNOSIS — Z7984 Long term (current) use of oral hypoglycemic drugs: Secondary | ICD-10-CM | POA: Diagnosis not present

## 2023-02-10 ENCOUNTER — Inpatient Hospital Stay: Payer: Medicare PPO

## 2023-02-10 VITALS — BP 126/69 | HR 54 | Temp 96.7°F | Resp 20

## 2023-02-10 DIAGNOSIS — C9001 Multiple myeloma in remission: Secondary | ICD-10-CM | POA: Diagnosis not present

## 2023-02-10 DIAGNOSIS — E538 Deficiency of other specified B group vitamins: Secondary | ICD-10-CM | POA: Diagnosis not present

## 2023-02-10 DIAGNOSIS — I252 Old myocardial infarction: Secondary | ICD-10-CM | POA: Diagnosis not present

## 2023-02-10 DIAGNOSIS — G620 Drug-induced polyneuropathy: Secondary | ICD-10-CM | POA: Diagnosis not present

## 2023-02-10 DIAGNOSIS — R197 Diarrhea, unspecified: Secondary | ICD-10-CM | POA: Diagnosis not present

## 2023-02-10 DIAGNOSIS — D500 Iron deficiency anemia secondary to blood loss (chronic): Secondary | ICD-10-CM | POA: Diagnosis not present

## 2023-02-10 DIAGNOSIS — Z79899 Other long term (current) drug therapy: Secondary | ICD-10-CM | POA: Diagnosis not present

## 2023-02-10 DIAGNOSIS — T451X5A Adverse effect of antineoplastic and immunosuppressive drugs, initial encounter: Secondary | ICD-10-CM | POA: Diagnosis not present

## 2023-02-10 LAB — MAGNESIUM: Magnesium: 1.2 mg/dL — ABNORMAL LOW (ref 1.7–2.4)

## 2023-02-10 MED ORDER — HEPARIN SOD (PORK) LOCK FLUSH 100 UNIT/ML IV SOLN
500.0000 [IU] | Freq: Once | INTRAVENOUS | Status: AC | PRN
  Administered 2023-02-10: 500 [IU]
  Filled 2023-02-10: qty 5

## 2023-02-10 MED ORDER — MAGNESIUM SULFATE 2 GM/50ML IV SOLN
2.0000 g | Freq: Once | INTRAVENOUS | Status: AC
  Administered 2023-02-10: 2 g via INTRAVENOUS
  Filled 2023-02-10: qty 50

## 2023-02-10 MED ORDER — SODIUM CHLORIDE 0.9% FLUSH
10.0000 mL | Freq: Once | INTRAVENOUS | Status: AC | PRN
  Administered 2023-02-10: 10 mL
  Filled 2023-02-10: qty 10

## 2023-02-10 MED ORDER — SODIUM CHLORIDE 0.9 % IV SOLN
Freq: Once | INTRAVENOUS | Status: AC
  Filled 2023-02-10: qty 250

## 2023-02-10 MED ORDER — SODIUM CHLORIDE 0.9 % IV SOLN
25.0000 mg | Freq: Once | INTRAVENOUS | Status: AC
  Administered 2023-02-10: 25 mg via INTRAVENOUS
  Filled 2023-02-10: qty 1

## 2023-02-10 MED ORDER — MAGNESIUM SULFATE 4 GM/100ML IV SOLN
4.0000 g | Freq: Once | INTRAVENOUS | Status: AC
  Administered 2023-02-10: 4 g via INTRAVENOUS
  Filled 2023-02-10: qty 100

## 2023-02-17 ENCOUNTER — Other Ambulatory Visit: Payer: Self-pay

## 2023-02-17 ENCOUNTER — Other Ambulatory Visit: Payer: Self-pay | Admitting: Oncology

## 2023-02-17 ENCOUNTER — Inpatient Hospital Stay: Payer: Medicare PPO

## 2023-02-17 VITALS — BP 116/68 | HR 60 | Temp 96.8°F | Resp 22

## 2023-02-17 DIAGNOSIS — C9001 Multiple myeloma in remission: Secondary | ICD-10-CM

## 2023-02-17 DIAGNOSIS — G620 Drug-induced polyneuropathy: Secondary | ICD-10-CM | POA: Diagnosis not present

## 2023-02-17 DIAGNOSIS — R197 Diarrhea, unspecified: Secondary | ICD-10-CM

## 2023-02-17 DIAGNOSIS — Z79899 Other long term (current) drug therapy: Secondary | ICD-10-CM | POA: Diagnosis not present

## 2023-02-17 DIAGNOSIS — D500 Iron deficiency anemia secondary to blood loss (chronic): Secondary | ICD-10-CM | POA: Diagnosis not present

## 2023-02-17 DIAGNOSIS — T451X5A Adverse effect of antineoplastic and immunosuppressive drugs, initial encounter: Secondary | ICD-10-CM | POA: Diagnosis not present

## 2023-02-17 DIAGNOSIS — E538 Deficiency of other specified B group vitamins: Secondary | ICD-10-CM | POA: Diagnosis not present

## 2023-02-17 DIAGNOSIS — Z95828 Presence of other vascular implants and grafts: Secondary | ICD-10-CM

## 2023-02-17 DIAGNOSIS — I252 Old myocardial infarction: Secondary | ICD-10-CM | POA: Diagnosis not present

## 2023-02-17 LAB — MAGNESIUM: Magnesium: 1.1 mg/dL — ABNORMAL LOW (ref 1.7–2.4)

## 2023-02-17 LAB — POTASSIUM: Potassium: 3.4 mmol/L — ABNORMAL LOW (ref 3.5–5.1)

## 2023-02-17 MED ORDER — MAGNESIUM SULFATE 2 GM/50ML IV SOLN
2.0000 g | Freq: Once | INTRAVENOUS | Status: AC
  Administered 2023-02-17: 2 g via INTRAVENOUS
  Filled 2023-02-17: qty 50

## 2023-02-17 MED ORDER — SODIUM CHLORIDE 0.9% FLUSH
10.0000 mL | Freq: Once | INTRAVENOUS | Status: AC
  Administered 2023-02-17: 10 mL via INTRAVENOUS
  Filled 2023-02-17: qty 10

## 2023-02-17 MED ORDER — HEPARIN SOD (PORK) LOCK FLUSH 100 UNIT/ML IV SOLN
500.0000 [IU] | Freq: Once | INTRAVENOUS | Status: AC | PRN
  Administered 2023-02-17: 500 [IU]
  Filled 2023-02-17: qty 5

## 2023-02-17 MED ORDER — POTASSIUM CHLORIDE CRYS ER 20 MEQ PO TBCR: 20.0000 meq | EXTENDED_RELEASE_TABLET | Freq: Every day | ORAL | 0 refills | Status: DC

## 2023-02-17 MED ORDER — SODIUM CHLORIDE 0.9 % IV SOLN
25.0000 mg | Freq: Once | INTRAVENOUS | Status: AC
  Administered 2023-02-17: 25 mg via INTRAVENOUS
  Filled 2023-02-17: qty 1

## 2023-02-17 MED ORDER — MAGNESIUM SULFATE 4 GM/100ML IV SOLN
4.0000 g | Freq: Once | INTRAVENOUS | Status: AC
  Administered 2023-02-17: 4 g via INTRAVENOUS
  Filled 2023-02-17: qty 100

## 2023-02-17 MED ORDER — SODIUM CHLORIDE 0.9 % IV SOLN
Freq: Once | INTRAVENOUS | Status: AC
  Filled 2023-02-17: qty 250

## 2023-02-18 ENCOUNTER — Other Ambulatory Visit: Payer: Self-pay

## 2023-02-18 ENCOUNTER — Telehealth: Payer: Self-pay

## 2023-02-18 DIAGNOSIS — T451X5A Adverse effect of antineoplastic and immunosuppressive drugs, initial encounter: Secondary | ICD-10-CM | POA: Diagnosis not present

## 2023-02-18 DIAGNOSIS — R197 Diarrhea, unspecified: Secondary | ICD-10-CM | POA: Diagnosis not present

## 2023-02-18 DIAGNOSIS — I252 Old myocardial infarction: Secondary | ICD-10-CM | POA: Diagnosis not present

## 2023-02-18 DIAGNOSIS — G620 Drug-induced polyneuropathy: Secondary | ICD-10-CM | POA: Diagnosis not present

## 2023-02-18 DIAGNOSIS — Z79899 Other long term (current) drug therapy: Secondary | ICD-10-CM | POA: Diagnosis not present

## 2023-02-18 DIAGNOSIS — E538 Deficiency of other specified B group vitamins: Secondary | ICD-10-CM | POA: Diagnosis not present

## 2023-02-18 DIAGNOSIS — C9001 Multiple myeloma in remission: Secondary | ICD-10-CM

## 2023-02-18 DIAGNOSIS — D500 Iron deficiency anemia secondary to blood loss (chronic): Secondary | ICD-10-CM | POA: Diagnosis not present

## 2023-02-18 LAB — C DIFFICILE QUICK SCREEN W PCR REFLEX
C Diff antigen: NEGATIVE
C Diff interpretation: NOT DETECTED
C Diff toxin: NEGATIVE

## 2023-02-18 LAB — GASTROINTESTINAL PANEL BY PCR, STOOL (REPLACES STOOL CULTURE)
Adenovirus F40/41: NOT DETECTED
Astrovirus: NOT DETECTED
Campylobacter species: DETECTED — AB
Cryptosporidium: NOT DETECTED
Cyclospora cayetanensis: NOT DETECTED
Entamoeba histolytica: NOT DETECTED
Enteroaggregative E coli (EAEC): NOT DETECTED
Enteropathogenic E coli (EPEC): DETECTED — AB
Enterotoxigenic E coli (ETEC): NOT DETECTED
Giardia lamblia: NOT DETECTED
Norovirus GI/GII: NOT DETECTED
Plesimonas shigelloides: NOT DETECTED
Rotavirus A: NOT DETECTED
Salmonella species: NOT DETECTED
Sapovirus (I, II, IV, and V): NOT DETECTED
Shiga like toxin producing E coli (STEC): NOT DETECTED
Shigella/Enteroinvasive E coli (EIEC): NOT DETECTED
Vibrio cholerae: NOT DETECTED
Vibrio species: NOT DETECTED
Yersinia enterocolitica: NOT DETECTED

## 2023-02-18 NOTE — Telephone Encounter (Signed)
Received critical from [facility] at main lab:   stool sample with EPEC enteropahtogenic E.coli and campylobacter.

## 2023-02-19 ENCOUNTER — Encounter: Payer: Self-pay | Admitting: Oncology

## 2023-02-24 ENCOUNTER — Encounter: Payer: Self-pay | Admitting: Oncology

## 2023-02-24 ENCOUNTER — Inpatient Hospital Stay: Payer: Medicare PPO

## 2023-02-24 ENCOUNTER — Inpatient Hospital Stay: Payer: Medicare PPO | Admitting: Oncology

## 2023-02-24 VITALS — BP 131/64 | HR 45 | Temp 95.7°F | Resp 18

## 2023-02-24 DIAGNOSIS — Z79899 Other long term (current) drug therapy: Secondary | ICD-10-CM | POA: Diagnosis not present

## 2023-02-24 DIAGNOSIS — C9001 Multiple myeloma in remission: Secondary | ICD-10-CM

## 2023-02-24 DIAGNOSIS — D500 Iron deficiency anemia secondary to blood loss (chronic): Secondary | ICD-10-CM

## 2023-02-24 DIAGNOSIS — A045 Campylobacter enteritis: Secondary | ICD-10-CM | POA: Diagnosis not present

## 2023-02-24 DIAGNOSIS — G620 Drug-induced polyneuropathy: Secondary | ICD-10-CM | POA: Diagnosis not present

## 2023-02-24 DIAGNOSIS — R197 Diarrhea, unspecified: Secondary | ICD-10-CM | POA: Diagnosis not present

## 2023-02-24 DIAGNOSIS — I252 Old myocardial infarction: Secondary | ICD-10-CM | POA: Diagnosis not present

## 2023-02-24 DIAGNOSIS — Z95828 Presence of other vascular implants and grafts: Secondary | ICD-10-CM

## 2023-02-24 DIAGNOSIS — T451X5A Adverse effect of antineoplastic and immunosuppressive drugs, initial encounter: Secondary | ICD-10-CM | POA: Diagnosis not present

## 2023-02-24 DIAGNOSIS — E538 Deficiency of other specified B group vitamins: Secondary | ICD-10-CM | POA: Diagnosis not present

## 2023-02-24 LAB — CBC WITH DIFFERENTIAL (CANCER CENTER ONLY)
Abs Immature Granulocytes: 0.03 10*3/uL (ref 0.00–0.07)
Basophils Absolute: 0.1 10*3/uL (ref 0.0–0.1)
Basophils Relative: 1 %
Eosinophils Absolute: 0.1 10*3/uL (ref 0.0–0.5)
Eosinophils Relative: 2 %
HCT: 37 % (ref 36.0–46.0)
Hemoglobin: 12.2 g/dL (ref 12.0–15.0)
Immature Granulocytes: 0 %
Lymphocytes Relative: 23 %
Lymphs Abs: 1.7 10*3/uL (ref 0.7–4.0)
MCH: 28.4 pg (ref 26.0–34.0)
MCHC: 33 g/dL (ref 30.0–36.0)
MCV: 86 fL (ref 80.0–100.0)
Monocytes Absolute: 0.7 10*3/uL (ref 0.1–1.0)
Monocytes Relative: 9 %
Neutro Abs: 4.8 10*3/uL (ref 1.7–7.7)
Neutrophils Relative %: 65 %
Platelet Count: 150 10*3/uL (ref 150–400)
RBC: 4.3 MIL/uL (ref 3.87–5.11)
RDW: 14 % (ref 11.5–15.5)
WBC Count: 7.3 10*3/uL (ref 4.0–10.5)
nRBC: 0 % (ref 0.0–0.2)

## 2023-02-24 LAB — CMP (CANCER CENTER ONLY)
ALT: 28 U/L (ref 0–44)
AST: 30 U/L (ref 15–41)
Albumin: 4.6 g/dL (ref 3.5–5.0)
Alkaline Phosphatase: 66 U/L (ref 38–126)
Anion gap: 10 (ref 5–15)
BUN: 26 mg/dL — ABNORMAL HIGH (ref 8–23)
CO2: 21 mmol/L — ABNORMAL LOW (ref 22–32)
Calcium: 8.7 mg/dL — ABNORMAL LOW (ref 8.9–10.3)
Chloride: 106 mmol/L (ref 98–111)
Creatinine: 1.34 mg/dL — ABNORMAL HIGH (ref 0.44–1.00)
GFR, Estimated: 44 mL/min — ABNORMAL LOW (ref >60)
Glucose, Bld: 162 mg/dL — ABNORMAL HIGH (ref 70–99)
Potassium: 3.9 mmol/L (ref 3.5–5.1)
Sodium: 137 mmol/L (ref 135–145)
Total Bilirubin: 0.4 mg/dL (ref 0.3–1.2)
Total Protein: 7.5 g/dL (ref 6.5–8.1)

## 2023-02-24 LAB — MAGNESIUM: Magnesium: 1.3 mg/dL — ABNORMAL LOW (ref 1.7–2.4)

## 2023-02-24 MED ORDER — SODIUM CHLORIDE 0.9 % IV SOLN
25.0000 mg | Freq: Once | INTRAVENOUS | Status: AC
  Administered 2023-02-24: 25 mg via INTRAVENOUS
  Filled 2023-02-24: qty 1

## 2023-02-24 MED ORDER — AZITHROMYCIN 500 MG PO TABS: 500.0000 mg | ORAL_TABLET | Freq: Every day | ORAL | 0 refills | Status: DC

## 2023-02-24 MED ORDER — MAGNESIUM SULFATE 2 GM/50ML IV SOLN
2.0000 g | Freq: Once | INTRAVENOUS | Status: AC
  Administered 2023-02-24: 2 g via INTRAVENOUS
  Filled 2023-02-24: qty 50

## 2023-02-24 MED ORDER — HEPARIN SOD (PORK) LOCK FLUSH 100 UNIT/ML IV SOLN
500.0000 [IU] | Freq: Once | INTRAVENOUS | Status: AC
  Administered 2023-02-24: 500 [IU]
  Filled 2023-02-24: qty 5

## 2023-02-24 MED ORDER — CYANOCOBALAMIN 1000 MCG/ML IJ SOLN
1000.0000 ug | Freq: Once | INTRAMUSCULAR | Status: AC
  Administered 2023-02-24: 1000 ug via INTRAMUSCULAR
  Filled 2023-02-24: qty 1

## 2023-02-24 MED ORDER — SODIUM CHLORIDE 0.9 % IV SOLN
Freq: Once | INTRAVENOUS | Status: AC
  Filled 2023-02-24: qty 250

## 2023-02-24 MED ORDER — MAGNESIUM SULFATE 4 GM/100ML IV SOLN
4.0000 g | Freq: Once | INTRAVENOUS | Status: AC
  Administered 2023-02-24: 4 g via INTRAVENOUS
  Filled 2023-02-24: qty 100

## 2023-02-24 NOTE — Progress Notes (Signed)
Hematology/Oncology Progress note [phone] [phone]     Chief Complaint: Sarah [name] is a 67 y.o. 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  Light chain ratio is normal. 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 Repeat immunofixation negative. . Labs reviewed and discussed with patient, stable M protein and stable light chain ratio Off Daratumumab due to frequent infections despite IVIG Continue acyclovir prophylaxis.    Iron deficiency anemia due to chronic blood loss Lab Results  Component Value Date   HGB 12.2 02/24/2023   TIBC 375 12/02/2022   IRONPCTSAT 18 12/02/2022   FERRITIN 26 12/02/2022    S/p Venofer treatments. Hb has improved. Observation.    Hypomagnesemia #Chronic hypomagnesemia proceed with  IV magnesium today.  Continue weekly magnesium +/- IV magnesium.     B12 deficiency B12 level is pending continue B12 injections Q 6 weeks.   Campylobacter gastroenteritis Enteropathogenic E coli and campylobacter gastroenteritis  Discussed with GI.  Her symptoms are persistent.  Continue supportive care. Hold of  antimotility agents Recommend Azithromycin 500mg  daily x 3 days.  She will return to clinic in 3 days for reassessment- she can not come back earlier due to other conflicts.  Patient is on Amiodarone for arrhythmia treatments. Azithromycin may prolong Qtc.  Alternative antibiotics fluocinolone can also increase Qtc. Will update her cardiology at [facility]    Orders Placed This Encounter  Procedures   CMP (Cancer  Center only)    Standing Status:   Future    Standing Expiration Date:   02/24/2024   Magnesium    Standing Status:   Future    Standing Expiration Date:   02/24/2024    Follow up  Lab Mag weekly x5  + IV mag.  6 weeks flex lab MD IV mag,  same labs  All questions were answered. The patient knows to call the clinic with any problems, questions or concerns.  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   Initial bone marrow revealed 80% plasma cells.  Lambda free light chains were 1340.  She had nephrotic range proteinuria.  She initially underwent induction with RVD.  Revlimid maintenance was discontinued on 01/21/2017 secondary to intolerance.     07/19/2019 Bone marrow aspirate and biopsy on  revealed a normocellular marrow with but increased lambda-restricted plasma cells (9% aspirate, 40% CD138 immunohistochemistry).  Findings were consistent with recurrent plasma cell myeloma.  Flow cytometry revealed no monoclonal B-cell or phenotypically aberrant T-cell population. Cytogenetics were 76, XX (normal).  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    Lambda light chains have been  followed: 22.2 (ratio 0.56) on 07/03/2017, 30.8 (ratio 0.78) on 09/02/2017, 36.9 (ratio 0.40) on 10/21/2017, 37.4 (ratio 0.41) on 12/16/2017, 70.7(ratio 0.31)  on 02/17/2018, 64.2 (ratio 0.27) on 04/07/2018, 78.9 (ratio 0.18) on 05/26/2018, 128.8 (ratio 0.17) on  08/06/2018, 181.5 (ratio 0.13) on 10/08/2018, 130.9 (ratio 0.13) on 10/20/2018, 160.7 (ratio 0.10) on 12/09/2018, 236.6 (ratio 0.07) on 02/01/2019, 363.6 (ratio 0.04) on 03/22/2019, 404.8 (ratio 0.04) on 04/05/2019, 420.7 (ratio 0.03) on 05/24/2019, 573.4 (ratio 0.03) on 06/23/2019, 451.05 (ratio 0.02) on 08/20/2019, 47.2 (ratio 0.13) on 10/25/2019, 22.4 (ratio 0.21) on 11/25/2019, 16.5 (ratio 0.33) on 01/10/2020, 14.6 (ratio 0.34) on 02/07/2020, 13.1 (ratio 0.31) on 03/07/2020, 10.1 (ratio 0.38) on 04/10/2020, and 9.5 (ratio 0.21) on 06/19/2020.   24 hour UPEP on 06/03/2019 revealed kappa free light chains 95.76, lambda free light chains 1,260.71, and ratio 0.08.  24 hour UPEP on 08/23/2019 revealed total protein of 782 mg/24 hrs with lambda free light chains 1,084.16 mg/L and ratio of 0.10 (1.03-31.76).  M spike in urine was 46.1% (361 mg/24 hrs).    Bone survey on 04/08/2016 and 05/28/2017 revealed no definite lytic lesion seen in the visualized skeleton.  Bone survey on 11/19/2018 revealed no suspicious lucent lesions and no acute bony abnormality.  PET scan on 07/12/2019 revealed no focal metabolic activity to suggest active myeloma within the skeleton. There were no lytic lesions identified on the CT portion of the exam or soft tissue plasmacytomas. There was no evidence of multiple myeloma.    Pretreatment RBC phenotype on 09/23/2019 was positive for C, e, DUFFY B, KIDD B, M, S, and s antigen; negative for c, E, KELL, DUFFY A, KIDD A, and N antigen.    09/27/2019 - 10/25/2019; 12/09/2019 - 03/13/2020 6 cycles of daratumumab and hyaluronidase-fihj, Pomalyst, and Decadron (DPd) .  Cycle #1 was complicated by fever and neutropenia requiring admission.  Cycle #2 was complicated by pneumonia requiring admission.  Cycle #6 was complicated with an ER evaluation for an elevated lactic acid.   05/10/2020 second autologous stem cell transplant at [facility]   She underwent conditioning with melphalan 140 mg/m2.     She is s/p week #3 Velcade maintenance (began 08/31/2020 - 09/28/2020).  She has no increase in neuropathy.  She has severe aortic stenosis.  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 Cardiology is following for a possible TAVR in the future.  Echo on 07/05/2020 revealed moderate to severe aortic stenosis with an EF of 50-55%.  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 Zometa was discontinued in 01/2017.  She has chronic nausea on Phenergan.     B12 deficiency.  B12 was 254 on 04/09/2017, 295 on 08/20/2018, and 391 on 10/08/2018.  She was on oral B12.  She received B12 monthly (last 09/14/2020).  Folate was 12.1 on 01/10/2020.    She has iron deficiency.  Ferritin was 32 on 07/01/2019.  She received Venofer on 07/15/2019 and 07/22/2019.   She has hypogammaglobulinemia.  IgG was 245 on 11/25/2019.  She received monthly IVIG (07/22/021 - 03/22/2020).  She received IVIG 400 mg/kg on 01/20/2020, 200 mg/kg on 02/17/2020, and 300 mg/kg on 03/22/2020.  IVIG on 01/20/2020 was complicated by acute renal failure. IgG trough level was 418 on 02/17/2020.  10/15/2019 - 10/20/2019 She was admitted to [facility] with fever and neutropenia.  Cultures were negative.  CXR was negative. She received broad spectrum antibiotics and daily Granix.  She received IVF for acute renal insufficiency due to diarrhea and dehydration.  Creatine was 1.66 on admission and 1.12 on discharge.    03/01/2020 - 03/05/2020 admitted to [facility] from with fever and neutropenia. CXR revealed no active cardiopulmonary disease. Chest CT with contrast revealed no acute intrathoracic pathology. There were findings which could be suggestive of prior granulomatous disease. She was treated with Cefepime and Vancomycin, then switched to ciprofloxacin on 03/03/2020.   05/08/2020 - 12/05/2021The patient was admitted to [facility] from  for autologous bone marrow  transplant.   She received conditioning with melphalan 140 mg/m2.  Bone marrow transplant was on 05/10/2020. The patient developed fevers daily from 05/19/2020 - 05/24/2020. Blood cultures were + for strept sanguis bacteremia.  She was treated cefepime, vancomycin, and solumedrol for possible engraftment syndrome. Cefepime was switched to ceftriaxone; she completed 2 weeks of ceftriaxone on 06/03/2020. She had chemotherapy induced diarrhea.  She experienced urinary retention.  Feb 2022 patient has been on maintenance Velcade 2 mg every 2 weeks.  Chronic diarrhea seen by gastroenterology Dr. Tobi Bastos and was recommended to avoid artificial sugars in her diet including Diet Coke and coffee mate.  She feels some improvement of her diarrhea frequency since the dietary modification.  GI profile PCR negative, C. difficile toxin A+ B negative, fecal calprotectin 77 12/19/2020 colonoscopy showed 2 subcentimeter polyps in the ascending colon, resected and removed.  Pathology showed tubular adenoma.  No malignancy.  Normal mucosa in the entire examined colon.  Biopsied, pathology showed colonic mucosa with no significant pathological alteration.  Negative for active inflammation and features of chronicity.  Negative for microscopic colitis, dysplasia, and malignanc  Diabetes, patient has discontinued metformin due to diarrhea and started on glipizide 2.5 mg   03/05/2021-03/09/2021 Patient was hospitalized due to fever and chills, nonproductive cough, shortness of breath. X-ray showed right lower lobe atelectasis versus pneumonia.  Blood cultures negative.  Urine culture showed 60,000 colonies of Enterococcus facialis.  Patient received antibiotics. Patient also had abdominal pelvis CT scan for work-up of intermittent lower abdomen pain.No acute abnormality.   05/14/21 last dose of Velcade maintenance.  establish care with neurology Dr. Theora Master and her neuropathy regimen has been switched from gabapentin to  Lyrica. 05/29/2021 patient got influenza   neuropathy medication has been switched from gabapentin/Lyrica to nortriptyline.  08/18/2021 Hospitalized due to pneumonia from metapnuemovirus, hospitalization was complicated with NSTEMI. She received heparin gtt.  Treated with antibiotics for coverage of pneumonia,  diuretics for pulmonary edema. She received IVIG 400mg /kg x 1 for hypoglobulinemia. Discharged on 08/25/21 with Augmentin and Zithromax.   May 2023, Daratumumab being held for [facility] infectious disease physician Dr. Kennedy Bucker due to frequent infection.  Diagnosed with CHF [HFrEF] -NYHA class II-III, she follows up with cardiology, started on entresto  hospitalization due to CHF exacerbation, community acquired pneumonia.   Presented to emergency room on 07/01/2022 Respiratory panel RT-PCR negative for COVID-19, influenza and RSV.   INTERVAL HISTORY Sarah [name] is a 67 y.o. female who has above history reviewed by me today presents for follow up visit for management of multiple myeloma.  Patient has been on weekly magnesium infusion as needed hypomagnesemia + CHF [nonischemic cardiomyopathy], aortic stenosis, follows up with cardiology. Continues to multiple loose BM since last week, this is a lot worse comparing to her baseline diarrhea.  Stool studies showed Enteropathogenic E coli and campylobacter    Past Medical History:  Diagnosis Date   Anemia    Anxiety    Aortic  stenosis    a. 05/2020 Echo: EF 45-50%, sev AS - seen by TAVR team @ [facility] - CTA sugg of tricuspid valve w/ fusing of 2 leaflets-TAVR deferred in setting of acute infxn; b. 07/2020 Echo: EF 50-55%, mod-sev AS; c. 12/2020 Echo: EF>55%. Mod-sev paradoxical low-flow low-gradient AS; d. 08/2021 Echo: EF 35-40%, severe AS, triv AI, mild MR.   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 Variable EF over  time; b. 08/2017 Echo: EF 40%; b. 03/2020 Echo: EF 55-60%; c. 05/2020 Echo: EF 45-50%; d. 07/2020 Echo: EF 50-55%; e. 12/2020 Echo: EF>55%; f. 08/2021 Echo: EF 35-40%.   Chronic heart failure with preserved ejection fraction (HFpEF) (HCC)    a. Variable EF over time; b. 08/2017 Echo: EF 40%; b. 03/2020 Echo: EF 55-60%; c. 05/2020 Echo: EF 45-50%; d. 07/2020 Echo: EF 50-55%; e. 12/2020 Echo: EF>55%; f. 08/2021 Echo: EF 35-40%, no RV, mildly dil LA, mild MR, triv AI, severe AS.   CKD (chronic kidney disease) stage 3, GFR 30-59 ml/min (HCC)    Depression    Diabetes mellitus (HCC)    Dizziness    Fatty liver    Frequent falls    GERD (gastroesophageal reflux disease)    Gout    Heart murmur    History of blood transfusion    History of bone marrow transplant (HCC)    History of uterine fibroid    Hx of cardiac catheterization    a. 01/2016 [facility] - after abnl nuc): Nl cors.   Hypertension    Hypomagnesemia    IDA (iron deficiency anemia)    Multiple myeloma (HCC)    NSTEMI (non-ST elevated myocardial infarction) (HCC) 08/21/2021   Personal history of chemotherapy    Pleurisy 04/09/2022   PSVT (paroxysmal supraventricular tachycardia)    a. S/p RFCA [facility]).   PVC's (premature ventricular contractions)    a. Well-managed w/ bisoprolol in outpt setting.   Renal cyst     Past Surgical History:  Procedure Laterality Date   ABDOMINAL HYSTERECTOMY     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   CARDIAC ELECTROPHYSIOLOGY MAPPING AND ABLATION     CARPAL TUNNEL RELEASE Bilateral    CHOLECYSTECTOMY  2008   COLONOSCOPY WITH PROPOFOL N/A 05/07/2017   Procedure: COLONOSCOPY WITH PROPOFOL;  Surgeon: Wyline Mood, MD;  Location: [facility] ENDOSCOPY;  Service: Gastroenterology;  Laterality: N/A;   COLONOSCOPY WITH PROPOFOL N/A 12/19/2020   Procedure: COLONOSCOPY WITH PROPOFOL;  Surgeon: Wyline Mood, MD;  Location: [facility] ENDOSCOPY;  Service: Gastroenterology;  Laterality: N/A;   ESOPHAGOGASTRODUODENOSCOPY  (EGD) WITH PROPOFOL N/A 05/07/2017   Procedure: ESOPHAGOGASTRODUODENOSCOPY (EGD) WITH PROPOFOL;  Surgeon: Wyline Mood, MD;  Location: [facility] ENDOSCOPY;  Service: Gastroenterology;  Laterality: N/A;   FOOT SURGERY Bilateral    INCONTINENCE SURGERY  2009   INTERSTIM IMPLANT PLACEMENT     other     over active bladder   OTHER SURGICAL HISTORY     bladder stimulator    PARTIAL HYSTERECTOMY  03/1996   fibroids   PORTA CATH INSERTION N/A 03/10/2019   Procedure: PORTA CATH INSERTION;  Surgeon: Annice Needy, MD;  Location: [facility] INVASIVE CV LAB;  Service: Cardiovascular;  Laterality: N/A;   TONSILLECTOMY  2007    Family History  Problem Relation Age of Onset   Colon cancer Father    Renal Disease Father  Diabetes Mellitus II Father    Melanoma Paternal Grandmother    Breast cancer Maternal Aunt 63   Anemia Mother    Heart disease Mother    Heart failure Mother    Renal Disease Mother    Congestive Heart Failure Mother    Heart disease Maternal Uncle    Throat cancer Maternal Uncle    Lung cancer Maternal Uncle    Liver disease Maternal Uncle    Heart failure Maternal Uncle    Hearing loss Son 53       Suicide     Social History:  reports that she quit smoking about 31 years ago. Her smoking use included cigarettes. She started smoking about 51 years ago. She has a 20 pack-year smoking history. She has never used smokeless tobacco. She reports current alcohol use of about 2.0 standard drinks of alcohol per week. She reports that she does not use drugs.  She is on disability. She notes exposure to perchloroethylene [facility]).   Allergies:  Allergies  Allergen Reactions   Oxycodone-Acetaminophen Anaphylaxis    Swelling and rash   Celebrex [Celecoxib] Diarrhea   Codeine    Plerixafor     In 2016 during ASCT collection patient developed fever to 103.86F and required hospitalization   Benadryl [Diphenhydramine] Palpitations   Morphine Itching and Rash   Ondansetron Diarrhea   Tylenol  [Acetaminophen] Itching and Rash    Current Medications: Current Outpatient Medications  Medication Sig Dispense Refill   allopurinol (ZYLOPRIM) 100 MG tablet TAKE 1 TABLET(100 MG) BY MOUTH DAILY as needed 90 tablet 1   amiodarone (PACERONE) 200 MG tablet Take 200 mg by mouth daily.     atorvastatin (LIPITOR) 10 MG tablet Take 0.5 tablets (5 mg total) by mouth daily. 45 tablet 1   azithromycin (ZITHROMAX) 500 MG tablet Take 1 tablet (500 mg total) by mouth daily. 3 tablet 0   bisoprolol (ZEBETA) 10 MG tablet Take 1 tablet (10 mg total) by mouth daily. 90 tablet 1   calcium carbonate (OS-CAL) 1250 (500 Ca) MG chewable tablet Chew 2 tablets by mouth daily.     diazepam (VALIUM) 5 MG tablet Take 0.5-1 tablets (2.5-5 mg total) by mouth at bedtime as needed (insomnia). 30 tablet 0   diclofenac sodium (VOLTAREN) 1 % GEL Apply 2 g topically 4 (four) times daily. 100 g 1   diphenoxylate-atropine (LOMOTIL) 2.5-0.025 MG tablet Take 2 tablets by mouth 4 (four) times daily as needed for diarrhea or loose stools. 180 tablet 3   empagliflozin (JARDIANCE) 10 MG TABS tablet Take 1 tablet by mouth daily.     furosemide (LASIX) 40 MG tablet Take 0.5 tablets (20 mg total) by mouth daily. Home dose.     glipiZIDE (GLUCOTROL XL) 2.5 MG 24 hr tablet Take 1 tablet (2.5 mg total) by mouth daily with breakfast. 90 tablet 1   glucose blood test strip      lidocaine (XYLOCAINE) 2 % solution Use as directed 15 mLs in the mouth or throat every 3 (three) hours as needed for mouth pain (swish and spit). 100 mL 0   losartan (COZAAR) 25 MG tablet Take 1 tablet by mouth daily.     magnesium chloride (SLOW-MAG) 64 MG TBEC SR tablet Take 1 tablet (64 mg total) by mouth daily. 60 tablet 1   Multiple Vitamin (MULTIVITAMIN) tablet Take 1 tablet by mouth daily.     omeprazole (PRILOSEC) 40 MG capsule TAKE 1 CAPSULE IN THE MORNING AND TAKE 1 CAPSULE  AT BEDTIME 180 capsule 1   pentoxifylline (TRENTAL) 400 MG CR tablet Take 400 mg by  mouth 3 (three) times daily with meals.     potassium chloride SA (KLOR-CON M) 20 MEQ tablet Take 1 tablet (20 mEq total) by mouth daily. 7 tablet 0   promethazine (PHENERGAN) 25 MG tablet Take 1 tablet (25 mg total) by mouth every 6 (six) hours as needed for nausea or vomiting. 90 tablet 1   spironolactone (ALDACTONE) 25 MG tablet Take by mouth.     tiZANidine (ZANAFLEX) 4 MG tablet Take 1 tablet (4 mg total) by mouth every 6 (six) hours as needed for muscle spasms. 90 tablet 0   traMADol (ULTRAM) 50 MG tablet      traZODone (DESYREL) 100 MG tablet TAKE 1 TABLET AT BEDTIME 90 tablet 3   venlafaxine (EFFEXOR) 75 MG tablet Take 75 mg by mouth daily.     vitamin E 45 MG (100 UNITS) capsule Take by mouth daily.     ENTRESTO 24-26 MG Take 1 tablet by mouth 2 (two) times daily. (Patient not taking: Reported on 02/24/2023)     No current facility-administered medications for this visit.   Facility-Administered Medications Ordered in Other Visits  Medication Dose Route Frequency Provider Last Rate Last Admin   sodium chloride flush (NS) 0.9 % injection 10 mL  10 mL Intravenous PRN Sarah Patience, MD   10 mL at 04/02/21 9147    Review of Systems  Constitutional:  Negative for chills, fever, malaise/fatigue and weight loss.  HENT:  Negative for sore throat.   Eyes:  Negative for redness.  Respiratory:  Negative for cough, shortness of breath and wheezing.   Cardiovascular:  Negative for chest pain, palpitations and leg swelling.  Gastrointestinal:  Positive for diarrhea. Negative for abdominal pain, blood in stool, nausea and vomiting.  Genitourinary:  Negative for dysuria.  Musculoskeletal:  Positive for back pain and joint pain. Negative for myalgias.  Skin:  Negative for rash.  Neurological:  Positive for tingling and sensory change. Negative for dizziness and tremors.  Endo/Heme/Allergies:  Does not bruise/bleed easily.  Psychiatric/Behavioral:  Negative for hallucinations.     Performance status  (ECOG): 1  Vitals Blood pressure 131/64, pulse (!) 45, temperature (!) 95.7 F (35.4 C), temperature source Tympanic, resp. rate 18, SpO2 99%.  Physical Exam Constitutional:      General: She is not in acute distress.    Appearance: She is not diaphoretic.  HENT:     Head: Normocephalic and atraumatic.  Eyes:     General: No scleral icterus.    Pupils: Pupils are equal, round, and reactive to light.  Cardiovascular:     Rate and Rhythm: Normal rate.     Heart sounds: Murmur heard.  Pulmonary:     Effort: Pulmonary effort is normal. No respiratory distress.     Breath sounds: No rales.  Chest:     Chest wall: No tenderness.  Abdominal:     General: There is no distension.  Musculoskeletal:        General: Normal range of motion.     Cervical back: Normal range of motion and neck supple.  Skin:    General: Skin is warm and dry.     Findings: No erythema.     Comments: Lower extremity cat bite cellulitis has improved.   Neurological:     Mental Status: She is alert and oriented to person, place, and time.     Cranial Nerves: No  cranial nerve deficit.     Motor: No abnormal muscle tone.     Coordination: Coordination normal.  Psychiatric:        Mood and Affect: Mood and affect normal.      Laboratory data    Latest Ref Rng & Units 02/24/2023    9:34 AM 01/13/2023    9:38 AM 12/02/2022    8:41 AM  CBC  WBC 4.0 - 10.5 K/uL 7.3  7.7  5.2   Hemoglobin 12.0 - 15.0 g/dL 16.1  09.6  04.5   Hematocrit 36.0 - 46.0 % 37.0  36.8  37.1   Platelets 150 - 400 K/uL 150  145  126       Latest Ref Rng & Units 02/24/2023    9:34 AM 02/17/2023    8:52 AM 01/13/2023    9:38 AM  CMP  Glucose 70 - 99 mg/dL 409   811   BUN 8 - 23 mg/dL 26   21   Creatinine 9.14 - 1.00 mg/dL 7.82   9.56   Sodium 213 - 145 mmol/L 137   137   Potassium 3.5 - 5.1 mmol/L 3.9  3.4  4.0   Chloride 98 - 111 mmol/L 106   106   CO2 22 - 32 mmol/L 21   21   Calcium 8.9 - 10.3 mg/dL 8.7   8.6   Total Protein  6.5 - 8.1 g/dL 7.5   7.4   Total Bilirubin 0.3 - 1.2 mg/dL 0.4   0.4   Alkaline Phos 38 - 126 U/L 66   79   AST 15 - 41 U/L 30   29   ALT 0 - 44 U/L 28   26

## 2023-02-24 NOTE — Assessment & Plan Note (Signed)
#  Chronic hypomagnesemia proceed with  IV magnesium today.  Continue weekly magnesium +/- IV magnesium.

## 2023-02-24 NOTE — Assessment & Plan Note (Addendum)
Lab Results  Component Value Date   HGB 12.2 02/24/2023   TIBC 375 12/02/2022   IRONPCTSAT 18 12/02/2022   FERRITIN 26 12/02/2022    S/p Venofer treatments. Hb has improved. Observation.

## 2023-02-24 NOTE — Assessment & Plan Note (Signed)
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  Light chain ratio is normal. 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 Repeat immunofixation negative. . Labs reviewed and discussed with patient, stable M protein and stable light chain ratio Off Daratumumab due to frequent infections despite IVIG Continue acyclovir prophylaxis.

## 2023-02-24 NOTE — Assessment & Plan Note (Addendum)
Enteropathogenic E coli and campylobacter gastroenteritis  Discussed with GI.  Her symptoms are persistent.  Continue supportive care. Hold of  antimotility agents Recommend Azithromycin 500mg  daily x 3 days.  She will return to clinic in 3 days for reassessment- she can not come back earlier due to other conflicts.  Patient is on Amiodarone for arrhythmia treatments. Azithromycin may prolong Qtc.  Alternative antibiotics fluocinolone can also increase Qtc. Will update her cardiology at [facility]

## 2023-02-24 NOTE — Assessment & Plan Note (Signed)
B12 level is pending continue B12 injections Q 6 weeks.

## 2023-02-25 ENCOUNTER — Telehealth: Payer: Self-pay | Admitting: *Deleted

## 2023-02-25 NOTE — Telephone Encounter (Signed)
Beth from [facility] called stated she was calling to answer some questions about putting patient on Azithromycin with a long QTC. Please return her call

## 2023-02-27 ENCOUNTER — Inpatient Hospital Stay (HOSPITAL_BASED_OUTPATIENT_CLINIC_OR_DEPARTMENT_OTHER): Payer: Medicare PPO | Admitting: Nurse Practitioner

## 2023-02-27 ENCOUNTER — Inpatient Hospital Stay: Payer: Medicare PPO

## 2023-02-27 ENCOUNTER — Other Ambulatory Visit: Payer: Self-pay

## 2023-02-27 ENCOUNTER — Encounter: Payer: Self-pay | Admitting: Nurse Practitioner

## 2023-02-27 VITALS — BP 162/100 | HR 69 | Temp 96.9°F | Resp 22

## 2023-02-27 DIAGNOSIS — R197 Diarrhea, unspecified: Secondary | ICD-10-CM

## 2023-02-27 DIAGNOSIS — C9001 Multiple myeloma in remission: Secondary | ICD-10-CM | POA: Diagnosis not present

## 2023-02-27 DIAGNOSIS — Z658 Other specified problems related to psychosocial circumstances: Secondary | ICD-10-CM

## 2023-02-27 DIAGNOSIS — I252 Old myocardial infarction: Secondary | ICD-10-CM | POA: Diagnosis not present

## 2023-02-27 DIAGNOSIS — Z79899 Other long term (current) drug therapy: Secondary | ICD-10-CM | POA: Diagnosis not present

## 2023-02-27 DIAGNOSIS — R9431 Abnormal electrocardiogram [ECG] [EKG]: Secondary | ICD-10-CM | POA: Diagnosis not present

## 2023-02-27 DIAGNOSIS — T451X5A Adverse effect of antineoplastic and immunosuppressive drugs, initial encounter: Secondary | ICD-10-CM | POA: Diagnosis not present

## 2023-02-27 DIAGNOSIS — E538 Deficiency of other specified B group vitamins: Secondary | ICD-10-CM | POA: Diagnosis not present

## 2023-02-27 DIAGNOSIS — A045 Campylobacter enteritis: Secondary | ICD-10-CM

## 2023-02-27 DIAGNOSIS — D500 Iron deficiency anemia secondary to blood loss (chronic): Secondary | ICD-10-CM | POA: Diagnosis not present

## 2023-02-27 DIAGNOSIS — G620 Drug-induced polyneuropathy: Secondary | ICD-10-CM | POA: Diagnosis not present

## 2023-02-27 LAB — CMP (CANCER CENTER ONLY)
ALT: 27 U/L (ref 0–44)
AST: 24 U/L (ref 15–41)
Albumin: 4.2 g/dL (ref 3.5–5.0)
Alkaline Phosphatase: 78 U/L (ref 38–126)
Anion gap: 6 (ref 5–15)
BUN: 25 mg/dL — ABNORMAL HIGH (ref 8–23)
CO2: 22 mmol/L (ref 22–32)
Calcium: 9.8 mg/dL (ref 8.9–10.3)
Chloride: 108 mmol/L (ref 98–111)
Creatinine: 1.29 mg/dL — ABNORMAL HIGH (ref 0.44–1.00)
GFR, Estimated: 46 mL/min — ABNORMAL LOW (ref >60)
Glucose, Bld: 150 mg/dL — ABNORMAL HIGH (ref 70–99)
Potassium: 4.4 mmol/L (ref 3.5–5.1)
Sodium: 136 mmol/L (ref 135–145)
Total Bilirubin: 0.2 mg/dL — ABNORMAL LOW (ref 0.3–1.2)
Total Protein: 7.2 g/dL (ref 6.5–8.1)

## 2023-02-27 LAB — KAPPA/LAMBDA LIGHT CHAINS
Kappa free light chain: 28.8 mg/L — ABNORMAL HIGH (ref 3.3–19.4)
Kappa, lambda light chain ratio: 1 (ref 0.26–1.65)
Lambda free light chains: 28.9 mg/L — ABNORMAL HIGH (ref 5.7–26.3)

## 2023-02-27 LAB — MAGNESIUM: Magnesium: 1.5 mg/dL — ABNORMAL LOW (ref 1.7–2.4)

## 2023-02-27 MED ORDER — SODIUM CHLORIDE 0.9 % IV SOLN
25.0000 mg | Freq: Once | INTRAVENOUS | Status: AC
  Administered 2023-02-27: 25 mg via INTRAVENOUS
  Filled 2023-02-27: qty 1

## 2023-02-27 MED ORDER — MAGNESIUM SULFATE 4 GM/100ML IV SOLN
4.0000 g | Freq: Once | INTRAVENOUS | Status: AC
  Administered 2023-02-27: 4 g via INTRAVENOUS
  Filled 2023-02-27: qty 100

## 2023-02-27 MED ORDER — HEPARIN SOD (PORK) LOCK FLUSH 100 UNIT/ML IV SOLN
500.0000 [IU] | Freq: Once | INTRAVENOUS | Status: AC
  Administered 2023-02-27: 500 [IU] via INTRAVENOUS
  Filled 2023-02-27: qty 5

## 2023-02-27 MED ORDER — SODIUM CHLORIDE 0.9 % IV SOLN
Freq: Once | INTRAVENOUS | Status: AC
  Filled 2023-02-27: qty 250

## 2023-02-27 MED ORDER — SODIUM CHLORIDE 0.9% FLUSH
10.0000 mL | Freq: Once | INTRAVENOUS | Status: AC
  Administered 2023-02-27: 10 mL via INTRAVENOUS
  Filled 2023-02-27: qty 10

## 2023-02-27 NOTE — Progress Notes (Signed)
[facility] CSW Counseling Note  Patient was referred by medical provider. Treatment type: Individual  Presenting Concerns: Patient and/or family reports the following symptoms/concerns: anxiety Duration of problem: several weeks; Severity of problem: moderate   Orientation:oriented to person, place, time/date, situation, day of week, month of year, and year.   Affect: Appropriate Risk of harm to self or others: No plan to harm self or others  Patient and/or Family's Strengths/Protective Factors: Social connections and Sense of purposeCapable of independent living  Arboriculturist fund of knowledge  Supportive family/friends      Goals Addressed: Patient will:  Reduce symptoms of: anxiety Increase knowledge and/or ability of: coping skills  Increase healthy adjustment to current life circumstances   Progress towards Goals: Initial   Interventions: Interventions utilized:  Motivational Interviewing and Strength-based      Assessment: Patient currently experiencing anxiety due to current events in her life.  Patient was tearful at times when discussing her concerns for her niece experiencing a custody battle.  Patient lives with her sister who is an alcoholic.  Tianna's two sons committed suicide and she stated her faith helped her endure.  She is having difficulty sleeping due to her anxiety.  She stated she has taken Prozac in the past and felt it "numbed" her feelings.  She reports taking an occasional Xanax.  She also expressed financial strain, but does not wish to apply for the [employer].      Plan: Follow up with CSW: 03/06/23 10am. Behavioral recommendations: Attempt to remain in present moment. Referral(s):  None at this time.       Sarah Lux [name], LCSW

## 2023-02-27 NOTE — Progress Notes (Signed)
Hematology/Oncology Progress Note [phone] [phone]     Chief Complaint: Sarah [name] is a 67 y.o. 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   Initial bone marrow revealed 80% plasma cells.  Lambda free light chains were 1340.  She had nephrotic range proteinuria.  She initially underwent induction with RVD.  Revlimid maintenance was discontinued on 01/21/2017 secondary to intolerance.     07/19/2019 Bone marrow aspirate and biopsy on  revealed a normocellular marrow with but increased lambda-restricted plasma cells (9% aspirate, 40% CD138 immunohistochemistry).  Findings were consistent with recurrent plasma cell myeloma.  Flow cytometry revealed no monoclonal B-cell or phenotypically aberrant T-cell population. Cytogenetics were 44, XX (normal).  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    Lambda light chains have been followed: 22.2 (ratio 0.56) on 07/03/2017, 30.8 (ratio 0.78) on 09/02/2017, 36.9 (ratio 0.40) on 10/21/2017, 37.4 (ratio 0.41) on 12/16/2017, 70.7(ratio 0.31)  on 02/17/2018, 64.2 (ratio 0.27) on 04/07/2018, 78.9 (ratio 0.18) on 05/26/2018, 128.8 (ratio 0.17) on 08/06/2018, 181.5 (ratio 0.13) on 10/08/2018, 130.9 (ratio 0.13) on 10/20/2018, 160.7 (ratio 0.10) on 12/09/2018, 236.6 (ratio 0.07) on 02/01/2019, 363.6 (ratio 0.04) on 03/22/2019, 404.8  (ratio 0.04) on 04/05/2019, 420.7 (ratio 0.03) on 05/24/2019, 573.4 (ratio 0.03) on 06/23/2019, 451.05 (ratio 0.02) on 08/20/2019, 47.2 (ratio 0.13) on 10/25/2019, 22.4 (ratio 0.21) on 11/25/2019, 16.5 (ratio 0.33) on 01/10/2020, 14.6 (ratio 0.34) on 02/07/2020, 13.1 (ratio 0.31) on 03/07/2020, 10.1 (ratio 0.38) on 04/10/2020, and 9.5 (ratio 0.21) on 06/19/2020.   24 hour UPEP on 06/03/2019 revealed kappa free light chains 95.76, lambda free light chains 1,260.71, and ratio 0.08.  24 hour UPEP on 08/23/2019 revealed total protein of 782 mg/24 hrs with lambda free light chains 1,084.16 mg/L and ratio of 0.10 (1.03-31.76).  M spike in urine was 46.1% (361 mg/24 hrs).    Bone survey on 04/08/2016 and 05/28/2017 revealed no definite lytic lesion seen in the visualized skeleton.  Bone survey on 11/19/2018 revealed no suspicious lucent lesions and no acute bony abnormality.  PET scan on 07/12/2019 revealed no focal metabolic activity to suggest active myeloma within the skeleton. There were no lytic lesions identified on the CT portion of the exam or soft tissue plasmacytomas. There was no evidence of multiple myeloma.    Pretreatment RBC phenotype on 09/23/2019 was positive for C, e, DUFFY B, KIDD B, M, S, and s antigen; negative for c, E, KELL, DUFFY A, KIDD A, and N antigen.    09/27/2019 - 10/25/2019; 12/09/2019 - 03/13/2020 6 cycles of daratumumab and hyaluronidase-fihj, Pomalyst, and Decadron (DPd) .  Cycle #1 was complicated by fever and neutropenia requiring admission.  Cycle #2 was complicated by pneumonia requiring admission.  Cycle #6 was complicated with an ER evaluation for an elevated lactic acid.   05/10/2020 second autologous stem cell transplant at [facility]   She underwent conditioning with melphalan 140 mg/m2.    She is  s/p week #3 Velcade maintenance (began 08/31/2020 - 09/28/2020).  She has no increase in neuropathy.  She has severe aortic stenosis.  Echo on 05/21/2020 revealed severe aortic  valve stenosis (tricuspid valve with 2 leaflets fused) and an EF of 45-50%. Cardiology is following for a possible TAVR in the future.  Echo on 07/05/2020 revealed moderate to severe aortic stenosis with an EF of 50-55%.  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 Zometa was discontinued in 01/2017.  She has chronic nausea on Phenergan.     B12 deficiency.  B12 was 254 on 04/09/2017, 295 on 08/20/2018, and 391 on 10/08/2018.  She was on oral B12.  She received B12 monthly (last 09/14/2020).  Folate was 12.1 on 01/10/2020.    She has iron deficiency.  Ferritin was 32 on 07/01/2019.  She received Venofer on 07/15/2019 and 07/22/2019.   She has hypogammaglobulinemia.  IgG was 245 on 11/25/2019.  She received monthly IVIG (07/22/021 - 03/22/2020).  She received IVIG 400 mg/kg on 01/20/2020, 200 mg/kg on 02/17/2020, and 300 mg/kg on 03/22/2020.  IVIG on 01/20/2020 was complicated by acute renal failure. IgG trough level was 418 on 02/17/2020.  10/15/2019 - 10/20/2019 She was admitted to [facility] with fever and neutropenia.  Cultures were negative.  CXR was negative. She received broad spectrum antibiotics and daily Granix.  She received IVF for acute renal insufficiency due to diarrhea and dehydration.  Creatine was 1.66 on admission and 1.12 on discharge.    03/01/2020 - 03/05/2020 admitted to [facility] from with fever and neutropenia. CXR revealed no active cardiopulmonary disease. Chest CT with contrast revealed no acute intrathoracic pathology. There were findings which could be suggestive of prior granulomatous disease. She was treated with Cefepime and Vancomycin, then switched to ciprofloxacin on 03/03/2020.   05/08/2020 - 12/05/2021The patient was admitted to [facility] from  for autologous bone marrow transplant.   She received conditioning with melphalan 140 mg/m2.  Bone marrow transplant was on 05/10/2020. The patient developed fevers daily from 05/19/2020 - 05/24/2020. Blood cultures  were + for strept sanguis bacteremia.  She was treated cefepime, vancomycin, and solumedrol for possible engraftment syndrome. Cefepime was switched to ceftriaxone; she completed 2 weeks of ceftriaxone on 06/03/2020. She had chemotherapy induced diarrhea.  She experienced urinary retention.  Feb 2022 patient has been on maintenance Velcade 2 mg every 2 weeks.  Chronic diarrhea seen by gastroenterology Dr. Tobi Bastos and was recommended to avoid artificial sugars in her diet including Diet Coke and coffee mate.  She feels some improvement of her diarrhea frequency since the dietary modification.  GI profile PCR negative, C. difficile toxin A+ B negative, fecal calprotectin 77 12/19/2020 colonoscopy showed 2 subcentimeter polyps in the ascending colon, resected and removed.  Pathology showed tubular adenoma.  No malignancy.  Normal mucosa in the entire examined colon.  Biopsied, pathology showed colonic mucosa with no significant pathological alteration.  Negative for active inflammation and features of chronicity.  Negative for microscopic colitis, dysplasia, and malignanc  Diabetes, patient has discontinued metformin due to diarrhea and started on glipizide 2.5 mg   03/05/2021-03/09/2021 Patient was hospitalized due to fever and chills, nonproductive cough, shortness of breath. X-ray showed right lower lobe atelectasis versus pneumonia.  Blood cultures negative.  Urine culture showed 60,000 colonies of Enterococcus facialis.  Patient received antibiotics. Patient also had abdominal pelvis CT scan for work-up of intermittent lower abdomen pain.No acute abnormality.   05/14/21 last dose of Velcade maintenance.  establish  care with neurology Dr. Theora Master and her neuropathy regimen has been switched from gabapentin to Lyrica. 05/29/2021 patient got influenza   neuropathy medication has been switched from gabapentin/Lyrica to nortriptyline.  08/18/2021 Hospitalized due to pneumonia from metapnuemovirus,  hospitalization was complicated with NSTEMI. She received heparin gtt.  Treated with antibiotics for coverage of pneumonia,  diuretics for pulmonary edema. She received IVIG 400mg /kg x 1 for hypoglobulinemia. Discharged on 08/25/21 with Augmentin and Zithromax.   May 2023, Daratumumab being held for [facility] infectious disease physician Dr. Kennedy Bucker due to frequent infection.  Diagnosed with CHF [HFrEF] -NYHA class II-III, she follows up with cardiology, started on entresto  hospitalization due to CHF exacerbation, community acquired pneumonia.   Presented to emergency room on 07/01/2022 Respiratory panel RT-PCR negative for COVID-19, influenza and RSV.   INTERVAL HISTORY Sarah [name] is a 67 y.o. female who has above history reviewed by me today presents for follow up visit for management of diarrhea and electrolyte abnormalities. Previously, stool studies were positive for enteropathogenic E. Coli and campylobacter. Since she was symptomatic, antibiotics were prescribed. She reports that she has not taken antibiotics and diarrhea has improved but not resolved. She has a lot of anxiety currently due to custody battle and family issues. She denies abdominal pain, fevers, chills.     Past Medical History:  Diagnosis Date   Anemia    Anxiety    Aortic stenosis    a. 05/2020 Echo: EF 45-50%, sev AS - seen by TAVR team @ [facility] - CTA sugg of tricuspid valve w/ fusing of 2 leaflets-TAVR deferred in setting of acute infxn; b. 07/2020 Echo: EF 50-55%, mod-sev AS; c. 12/2020 Echo: EF>55%. Mod-sev paradoxical low-flow low-gradient AS; d. 08/2021 Echo: EF 35-40%, severe AS, triv AI, mild MR.   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 Variable EF over time; b. 08/2017 Echo: EF 40%; b. 03/2020 Echo: EF 55-60%; c. 05/2020 Echo: EF 45-50%; d. 07/2020 Echo: EF 50-55%; e. 12/2020 Echo: EF>55%; f.  08/2021 Echo: EF 35-40%.   Chronic heart failure with preserved ejection fraction (HFpEF) (HCC)    a. Variable EF over time; b. 08/2017 Echo: EF 40%; b. 03/2020 Echo: EF 55-60%; c. 05/2020 Echo: EF 45-50%; d. 07/2020 Echo: EF 50-55%; e. 12/2020 Echo: EF>55%; f. 08/2021 Echo: EF 35-40%, no RV, mildly dil LA, mild MR, triv AI, severe AS.   CKD (chronic kidney disease) stage 3, GFR 30-59 ml/min (HCC)    Depression    Diabetes mellitus (HCC)    Dizziness    Fatty liver    Frequent falls    GERD (gastroesophageal reflux disease)    Gout    Heart murmur    History of blood transfusion    History of bone marrow transplant (HCC)    History of uterine fibroid    Hx of cardiac catheterization    a. 01/2016 Cath [facility] - after abnl nuc): Nl cors.   Hypertension    Hypomagnesemia    IDA (iron deficiency anemia)    Multiple myeloma (HCC)    NSTEMI (non-ST elevated myocardial infarction) (HCC) 08/21/2021   Personal history of chemotherapy    Pleurisy 04/09/2022   PSVT (paroxysmal supraventricular tachycardia)    a. S/p RFCA [facility]).   PVC's (premature ventricular contractions)    a. Well-managed w/ bisoprolol in outpt setting.   Renal  cyst     Past Surgical History:  Procedure Laterality Date   ABDOMINAL HYSTERECTOMY     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   CARDIAC ELECTROPHYSIOLOGY MAPPING AND ABLATION     CARPAL TUNNEL RELEASE Bilateral    CHOLECYSTECTOMY  2008   COLONOSCOPY WITH PROPOFOL N/A 05/07/2017   Procedure: COLONOSCOPY WITH PROPOFOL;  Surgeon: Wyline Mood, MD;  Location: [facility] ENDOSCOPY;  Service: Gastroenterology;  Laterality: N/A;   COLONOSCOPY WITH PROPOFOL N/A 12/19/2020   Procedure: COLONOSCOPY WITH PROPOFOL;  Surgeon: Wyline Mood, MD;  Location: [facility] ENDOSCOPY;  Service: Gastroenterology;  Laterality: N/A;   ESOPHAGOGASTRODUODENOSCOPY (EGD) WITH PROPOFOL N/A 05/07/2017   Procedure: ESOPHAGOGASTRODUODENOSCOPY (EGD) WITH PROPOFOL;  Surgeon: Wyline Mood, MD;  Location: [facility]  ENDOSCOPY;  Service: Gastroenterology;  Laterality: N/A;   FOOT SURGERY Bilateral    INCONTINENCE SURGERY  2009   INTERSTIM IMPLANT PLACEMENT     other     over active bladder   OTHER SURGICAL HISTORY     bladder stimulator    PARTIAL HYSTERECTOMY  03/1996   fibroids   PORTA CATH INSERTION N/A 03/10/2019   Procedure: PORTA CATH INSERTION;  Surgeon: Annice Needy, MD;  Location: [facility] INVASIVE CV LAB;  Service: Cardiovascular;  Laterality: N/A;   TONSILLECTOMY  2007    Family History  Problem Relation Age of Onset   Colon cancer Father    Renal Disease Father    Diabetes Mellitus II Father    Melanoma Paternal Grandmother    Breast cancer Maternal Aunt 18   Anemia Mother    Heart disease Mother    Heart failure Mother    Renal Disease Mother    Congestive Heart Failure Mother    Heart disease Maternal Uncle    Throat cancer Maternal Uncle    Lung cancer Maternal Uncle    Liver disease Maternal Uncle    Heart failure Maternal Uncle    Hearing loss Son 57       Suicide     Social History:  reports that she quit smoking about 31 years ago. Her smoking use included cigarettes. She started smoking about 51 years ago. She has a 20 pack-year smoking history. She has never used smokeless tobacco. She reports current alcohol use of about 2.0 standard drinks of alcohol per week. She reports that she does not use drugs.  She is on disability. She notes exposure to perchloroethylene [facility]).   Allergies:  Allergies  Allergen Reactions   Oxycodone-Acetaminophen Anaphylaxis    Swelling and rash   Celebrex [Celecoxib] Diarrhea   Codeine    Plerixafor     In 2016 during ASCT collection patient developed fever to 103.63F and required hospitalization   Benadryl [Diphenhydramine] Palpitations   Morphine Itching and Rash   Ondansetron Diarrhea   Tylenol [Acetaminophen] Itching and Rash    Current Medications: Current Outpatient Medications  Medication Sig Dispense Refill   allopurinol  (ZYLOPRIM) 100 MG tablet TAKE 1 TABLET(100 MG) BY MOUTH DAILY as needed 90 tablet 1   amiodarone (PACERONE) 200 MG tablet Take 200 mg by mouth daily.     atorvastatin (LIPITOR) 10 MG tablet Take 0.5 tablets (5 mg total) by mouth daily. 45 tablet 1   bisoprolol (ZEBETA) 10 MG tablet Take 1 tablet (10 mg total) by mouth daily. 90 tablet 1   calcium carbonate (OS-CAL) 1250 (500 Ca) MG chewable tablet Chew 2 tablets by mouth daily.     diazepam (VALIUM) 5 MG tablet Take 0.5-1 tablets (2.5-5  mg total) by mouth at bedtime as needed (insomnia). 30 tablet 0   diclofenac sodium (VOLTAREN) 1 % GEL Apply 2 g topically 4 (four) times daily. 100 g 1   empagliflozin (JARDIANCE) 10 MG TABS tablet Take 1 tablet by mouth daily.     glipiZIDE (GLUCOTROL XL) 2.5 MG 24 hr tablet Take 1 tablet (2.5 mg total) by mouth daily with breakfast. 90 tablet 1   glucose blood test strip      losartan (COZAAR) 25 MG tablet Take 1 tablet by mouth daily.     magnesium chloride (SLOW-MAG) 64 MG TBEC SR tablet Take 1 tablet (64 mg total) by mouth daily. 60 tablet 1   Multiple Vitamin (MULTIVITAMIN) tablet Take 1 tablet by mouth daily.     omeprazole (PRILOSEC) 40 MG capsule TAKE 1 CAPSULE IN THE MORNING AND TAKE 1 CAPSULE AT BEDTIME 180 capsule 1   potassium chloride SA (KLOR-CON M) 20 MEQ tablet Take 1 tablet (20 mEq total) by mouth daily. 7 tablet 0   promethazine (PHENERGAN) 25 MG tablet Take 1 tablet (25 mg total) by mouth every 6 (six) hours as needed for nausea or vomiting. 90 tablet 1   spironolactone (ALDACTONE) 25 MG tablet Take by mouth.     tiZANidine (ZANAFLEX) 4 MG tablet Take 1 tablet (4 mg total) by mouth every 6 (six) hours as needed for muscle spasms. 90 tablet 0   traMADol (ULTRAM) 50 MG tablet      traZODone (DESYREL) 100 MG tablet TAKE 1 TABLET AT BEDTIME 90 tablet 3   venlafaxine (EFFEXOR) 75 MG tablet Take 75 mg by mouth daily.     vitamin E 45 MG (100 UNITS) capsule Take by mouth daily.     azithromycin  (ZITHROMAX) 500 MG tablet Take 1 tablet (500 mg total) by mouth daily. (Patient not taking: Reported on 02/27/2023) 3 tablet 0   diphenoxylate-atropine (LOMOTIL) 2.5-0.025 MG tablet Take 2 tablets by mouth 4 (four) times daily as needed for diarrhea or loose stools. (Patient not taking: Reported on 02/27/2023) 180 tablet 3   ENTRESTO 24-26 MG Take 1 tablet by mouth 2 (two) times daily. (Patient not taking: Reported on 02/24/2023)     furosemide (LASIX) 40 MG tablet Take 0.5 tablets (20 mg total) by mouth daily. Home dose. (Patient not taking: Reported on 02/27/2023)     lidocaine (XYLOCAINE) 2 % solution Use as directed 15 mLs in the mouth or throat every 3 (three) hours as needed for mouth pain (swish and spit). (Patient not taking: Reported on 02/27/2023) 100 mL 0   pentoxifylline (TRENTAL) 400 MG CR tablet Take 400 mg by mouth 3 (three) times daily with meals. (Patient not taking: Reported on 02/27/2023)     No current facility-administered medications for this visit.   Facility-Administered Medications Ordered in Other Visits  Medication Dose Route Frequency Provider Last Rate Last Admin   0.9 %  sodium chloride infusion   Intravenous Once Rickard Patience, MD       heparin lock flush 100 unit/mL  500 Units Intravenous Once Rickard Patience, MD       magnesium sulfate IVPB 4 g 100 mL  4 g Intravenous Once Rickard Patience, MD 50 mL/hr at 02/27/23 1026 4 g at 02/27/23 1026   sodium chloride flush (NS) 0.9 % injection 10 mL  10 mL Intravenous PRN Rickard Patience, MD   10 mL at 04/02/21 8119    Review of Systems  Constitutional:  Positive for malaise/fatigue. Negative for  chills, fever and weight loss.  Respiratory:  Negative for cough, sputum production, shortness of breath and wheezing.   Cardiovascular:  Negative for chest pain, palpitations and leg swelling.  Gastrointestinal:  Positive for diarrhea. Negative for abdominal pain, blood in stool, melena, nausea and vomiting.  Genitourinary:  Negative for dysuria and hematuria.   Musculoskeletal:  Positive for back pain and joint pain. Negative for falls and myalgias.  Neurological:  Positive for tingling. Negative for dizziness, tremors, sensory change and weakness.  Endo/Heme/Allergies:  Does not bruise/bleed easily.  Psychiatric/Behavioral:  Positive for depression. Negative for hallucinations. The patient is nervous/anxious and has insomnia.     Performance status (ECOG): 1  Vitals Blood pressure (!) 162/100, pulse 69, temperature (!) 96.9 F (36.1 C), temperature source Tympanic, resp. rate (!) 22.  Physical Exam Constitutional:      General: She is not in acute distress.    Appearance: She is not diaphoretic.  HENT:     Head: Normocephalic and atraumatic.  Eyes:     General: No scleral icterus.    Pupils: Pupils are equal, round, and reactive to light.  Cardiovascular:     Rate and Rhythm: Normal rate.     Heart sounds: Murmur heard.  Pulmonary:     Effort: Pulmonary effort is normal. No respiratory distress.     Breath sounds: No rales.  Chest:     Chest wall: No tenderness.  Abdominal:     General: There is no distension.  Musculoskeletal:        General: Normal range of motion.     Cervical back: Normal range of motion and neck supple.  Skin:    General: Skin is warm and dry.     Findings: No erythema.     Comments: Lower extremity cat bite cellulitis has improved.   Neurological:     Mental Status: She is alert and oriented to person, place, and time.     Cranial Nerves: No cranial nerve deficit.     Motor: No abnormal muscle tone.     Coordination: Coordination normal.  Psychiatric:        Mood and Affect: Mood and affect normal.      Laboratory data    Latest Ref Rng & Units 02/24/2023    9:34 AM 01/13/2023    9:38 AM 12/02/2022    8:41 AM  CBC  WBC 4.0 - 10.5 K/uL 7.3  7.7  5.2   Hemoglobin 12.0 - 15.0 g/dL 16.1  09.6  04.5   Hematocrit 36.0 - 46.0 % 37.0  36.8  37.1   Platelets 150 - 400 K/uL 150  145  126       Latest  Ref Rng & Units 02/27/2023    8:33 AM 02/24/2023    9:34 AM 02/17/2023    8:52 AM  CMP  Glucose 70 - 99 mg/dL 409  811    BUN 8 - 23 mg/dL 25  26    Creatinine 9.14 - 1.00 mg/dL 7.82  9.56    Sodium 213 - 145 mmol/L 136  137    Potassium 3.5 - 5.1 mmol/L 4.4  3.9  3.4   Chloride 98 - 111 mmol/L 108  106    CO2 22 - 32 mmol/L 22  21    Calcium 8.9 - 10.3 mg/dL 9.8  8.7    Total Protein 6.5 - 8.1 g/dL 7.2  7.5    Total Bilirubin 0.3 - 1.2 mg/dL 0.2  0.4  Alkaline Phos 38 - 126 U/L 78  66    AST 15 - 41 U/L 24  30    ALT 0 - 44 U/L 27  28     ASSESSMENT & PLAN:   Campylobacter gastroenteritis- Enteropathogenic E coli and campylobacter gastroenteritis; symptomatic. Prescribed azithromycin 500 mg daily x 3 days but she was not able to pickup and start. She has been holding anti-diarrheals. Symptoms have improved without treatments. Hold off on azithromycin. Continue supportive care.  QT prolongation- on amiodarone for arrhythmia. Hold off on EKG since she has not started azithromycin which may have prolonged QT further.  Recurrent lambda light chain multiple myeloma s/p second autologous bone marrow transplant [05/10/2020]. Most recent Multiple myeloma showed negative M protein.  Light chain ratio is normal. 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 Repeat immunofixation negative. Labs reviewed and discussed with patient, stable M protein and stable light chain ratio. Off Daratumumab due to frequent infections despite IVIG. Continue acyclovir prophylaxis.  Iron Deficiency- s/p IV iron. Ferritin 26, Iron sat 18%.  Hypomagnesemia- Chronic. IV magnesium today. Continue weekly IV magnesium.  B12 Deficiency- B12 389. Normal. Monitor.  Referral to social work to review psychosocial stressors with current family situation. Larita Fife to see patient in clinic today.   I updated Dr Cathie Hoops regarding  today's visit. Patient will follow up with her in 2 weeks. We will continue weekly infusions for ongoing chronic hypomagnesemia.      Orders Placed This Encounter  Procedures   Ambulatory referral to Social Work    Referral Priority:   Routine    Referral Type:   Consultation    Referral Reason:   Specialty Services Required    Number of Visits Requested:   1   Disposition:  1 week- lab (bmp, mg), +/- magnesium Reschedule Dr Bethanne Ginger 9/3 appt to 9/12- lab, Dr Cathie Hoops, +/- mag- la  All questions were answered. The patient knows to call the clinic with any problems, questions or concerns.  Consuello Masse, DNP, AGNP-C, AOCNP [facility] [phone] (clinic)

## 2023-02-28 DIAGNOSIS — R5383 Other fatigue: Secondary | ICD-10-CM | POA: Diagnosis not present

## 2023-02-28 LAB — MULTIPLE MYELOMA PANEL, SERUM
Albumin SerPl Elph-Mcnc: 4.3 g/dL (ref 2.9–4.4)
Albumin/Glob SerPl: 1.6 (ref 0.7–1.7)
Alpha 1: 0.2 g/dL (ref 0.0–0.4)
Alpha2 Glob SerPl Elph-Mcnc: 0.8 g/dL (ref 0.4–1.0)
B-Globulin SerPl Elph-Mcnc: 1 g/dL (ref 0.7–1.3)
Gamma Glob SerPl Elph-Mcnc: 0.7 g/dL (ref 0.4–1.8)
Globulin, Total: 2.8 g/dL (ref 2.2–3.9)
IgA: 258 mg/dL (ref 87–352)
IgG (Immunoglobin G), Serum: 860 mg/dL (ref 586–1602)
IgM (Immunoglobulin M), Srm: 36 mg/dL (ref 26–217)
Total Protein ELP: 7.1 g/dL (ref 6.0–8.5)

## 2023-03-04 ENCOUNTER — Inpatient Hospital Stay: Payer: Medicare PPO

## 2023-03-04 ENCOUNTER — Inpatient Hospital Stay: Payer: Medicare PPO | Admitting: Oncology

## 2023-03-06 ENCOUNTER — Inpatient Hospital Stay: Payer: Medicare PPO

## 2023-03-06 ENCOUNTER — Ambulatory Visit: Payer: Medicare PPO

## 2023-03-06 ENCOUNTER — Inpatient Hospital Stay: Payer: Medicare PPO | Attending: Oncology

## 2023-03-06 ENCOUNTER — Other Ambulatory Visit: Payer: Medicare PPO

## 2023-03-06 DIAGNOSIS — Z841 Family history of disorders of kidney and ureter: Secondary | ICD-10-CM | POA: Insufficient documentation

## 2023-03-06 DIAGNOSIS — K219 Gastro-esophageal reflux disease without esophagitis: Secondary | ICD-10-CM | POA: Diagnosis not present

## 2023-03-06 DIAGNOSIS — Z87891 Personal history of nicotine dependence: Secondary | ICD-10-CM | POA: Insufficient documentation

## 2023-03-06 DIAGNOSIS — Z886 Allergy status to analgesic agent status: Secondary | ICD-10-CM | POA: Diagnosis not present

## 2023-03-06 DIAGNOSIS — Z9484 Stem cells transplant status: Secondary | ICD-10-CM | POA: Insufficient documentation

## 2023-03-06 DIAGNOSIS — Z808 Family history of malignant neoplasm of other organs or systems: Secondary | ICD-10-CM | POA: Insufficient documentation

## 2023-03-06 DIAGNOSIS — Z885 Allergy status to narcotic agent status: Secondary | ICD-10-CM | POA: Insufficient documentation

## 2023-03-06 DIAGNOSIS — C9001 Multiple myeloma in remission: Secondary | ICD-10-CM | POA: Insufficient documentation

## 2023-03-06 DIAGNOSIS — Z800 Family history of malignant neoplasm of digestive organs: Secondary | ICD-10-CM | POA: Insufficient documentation

## 2023-03-06 DIAGNOSIS — I252 Old myocardial infarction: Secondary | ICD-10-CM | POA: Diagnosis not present

## 2023-03-06 DIAGNOSIS — K521 Toxic gastroenteritis and colitis: Secondary | ICD-10-CM | POA: Diagnosis not present

## 2023-03-06 DIAGNOSIS — Z9049 Acquired absence of other specified parts of digestive tract: Secondary | ICD-10-CM | POA: Insufficient documentation

## 2023-03-06 DIAGNOSIS — D500 Iron deficiency anemia secondary to blood loss (chronic): Secondary | ICD-10-CM | POA: Diagnosis not present

## 2023-03-06 DIAGNOSIS — I5032 Chronic diastolic (congestive) heart failure: Secondary | ICD-10-CM | POA: Diagnosis not present

## 2023-03-06 DIAGNOSIS — I130 Hypertensive heart and chronic kidney disease with heart failure and stage 1 through stage 4 chronic kidney disease, or unspecified chronic kidney disease: Secondary | ICD-10-CM | POA: Diagnosis not present

## 2023-03-06 DIAGNOSIS — Z888 Allergy status to other drugs, medicaments and biological substances status: Secondary | ICD-10-CM | POA: Diagnosis not present

## 2023-03-06 DIAGNOSIS — F32A Depression, unspecified: Secondary | ICD-10-CM | POA: Diagnosis not present

## 2023-03-06 DIAGNOSIS — Z79899 Other long term (current) drug therapy: Secondary | ICD-10-CM | POA: Insufficient documentation

## 2023-03-06 DIAGNOSIS — G620 Drug-induced polyneuropathy: Secondary | ICD-10-CM | POA: Insufficient documentation

## 2023-03-06 DIAGNOSIS — Z801 Family history of malignant neoplasm of trachea, bronchus and lung: Secondary | ICD-10-CM | POA: Insufficient documentation

## 2023-03-06 DIAGNOSIS — E538 Deficiency of other specified B group vitamins: Secondary | ICD-10-CM | POA: Diagnosis not present

## 2023-03-06 DIAGNOSIS — Z833 Family history of diabetes mellitus: Secondary | ICD-10-CM | POA: Insufficient documentation

## 2023-03-06 DIAGNOSIS — E1122 Type 2 diabetes mellitus with diabetic chronic kidney disease: Secondary | ICD-10-CM | POA: Diagnosis not present

## 2023-03-06 DIAGNOSIS — R6884 Jaw pain: Secondary | ICD-10-CM | POA: Insufficient documentation

## 2023-03-06 DIAGNOSIS — N1832 Chronic kidney disease, stage 3b: Secondary | ICD-10-CM | POA: Diagnosis not present

## 2023-03-06 DIAGNOSIS — Z822 Family history of deafness and hearing loss: Secondary | ICD-10-CM | POA: Insufficient documentation

## 2023-03-06 DIAGNOSIS — T451X5A Adverse effect of antineoplastic and immunosuppressive drugs, initial encounter: Secondary | ICD-10-CM | POA: Diagnosis not present

## 2023-03-06 DIAGNOSIS — I350 Nonrheumatic aortic (valve) stenosis: Secondary | ICD-10-CM | POA: Diagnosis not present

## 2023-03-06 DIAGNOSIS — Z8249 Family history of ischemic heart disease and other diseases of the circulatory system: Secondary | ICD-10-CM | POA: Insufficient documentation

## 2023-03-06 DIAGNOSIS — Z9481 Bone marrow transplant status: Secondary | ICD-10-CM | POA: Insufficient documentation

## 2023-03-06 DIAGNOSIS — Z803 Family history of malignant neoplasm of breast: Secondary | ICD-10-CM | POA: Insufficient documentation

## 2023-03-06 DIAGNOSIS — Z90710 Acquired absence of both cervix and uterus: Secondary | ICD-10-CM | POA: Insufficient documentation

## 2023-03-06 LAB — MAGNESIUM: Magnesium: 1.2 mg/dL — ABNORMAL LOW (ref 1.7–2.4)

## 2023-03-06 MED ORDER — MAGNESIUM SULFATE 2 GM/50ML IV SOLN
2.0000 g | Freq: Once | INTRAVENOUS | Status: AC
  Administered 2023-03-06: 2 g via INTRAVENOUS
  Filled 2023-03-06: qty 50

## 2023-03-06 MED ORDER — HEPARIN SOD (PORK) LOCK FLUSH 100 UNIT/ML IV SOLN
500.0000 [IU] | Freq: Once | INTRAVENOUS | Status: AC | PRN
  Administered 2023-03-06: 500 [IU]
  Filled 2023-03-06: qty 5

## 2023-03-06 MED ORDER — SODIUM CHLORIDE 0.9 % IV SOLN
25.0000 mg | Freq: Once | INTRAVENOUS | Status: AC
  Administered 2023-03-06: 25 mg via INTRAVENOUS
  Filled 2023-03-06: qty 1

## 2023-03-06 MED ORDER — MAGNESIUM SULFATE 4 GM/100ML IV SOLN
4.0000 g | Freq: Once | INTRAVENOUS | Status: AC
  Administered 2023-03-06: 4 g via INTRAVENOUS
  Filled 2023-03-06: qty 100

## 2023-03-06 MED ORDER — SODIUM CHLORIDE 0.9 % IV SOLN
Freq: Once | INTRAVENOUS | Status: AC
  Filled 2023-03-06: qty 250

## 2023-03-06 MED ORDER — SODIUM CHLORIDE 0.9% FLUSH
10.0000 mL | Freq: Once | INTRAVENOUS | Status: AC | PRN
  Administered 2023-03-06: 10 mL
  Filled 2023-03-06: qty 10

## 2023-03-06 NOTE — Progress Notes (Signed)
[facility] CSW Counseling Note  Patient was referred by medical provider. Treatment type: Individual  Presenting Concerns: Patient and/or family reports the following symptoms/concerns: anxiety Duration of problem: several weeks; Severity of problem: moderate   Orientation:oriented to person, place, time/date, situation, day of week, month of year, and year.   Affect: Appropriate Risk of harm to self or others: No plan to harm self or others  Patient and/or Family's Strengths/Protective Factors: Concrete supports in place (healthy food, safe environments, etc.) and Sense of purposeCapable of independent living  Communication skills  General fund of knowledge  Motivation for treatment/growth  Supportive family/friends      Goals Addressed: Patient will:  Reduce symptoms of: anxiety Increase knowledge and/or ability of: self-management skills  Increase healthy adjustment to current life circumstances   Progress towards Goals: Progressing   Interventions: Interventions utilized:  Strength-based and Supportive      Assessment: Patient currently experiencing decreased anxiety.  She reports feeling much better this week due to her home life being less chaotic.  She does remain somewhat anxious with the upcoming custody hearing for her great niece.  Kenniyah said she has an appointment with her PCP next week and is going to ask her for an anti-anxiety medication.  She also expressed financial strain.     Plan: Follow up with CSW: 9/19 Behavioral recommendations: Focus on breathing and staying in the present moment. Referral(s): Mady Haagensen for the [employer], food from [employer] and a [employer] card.       Pascal Lux Lacie Landry, LCSW

## 2023-03-07 ENCOUNTER — Encounter: Payer: Self-pay | Admitting: Internal Medicine

## 2023-03-07 ENCOUNTER — Other Ambulatory Visit: Payer: Self-pay | Admitting: Internal Medicine

## 2023-03-07 ENCOUNTER — Encounter: Payer: Self-pay | Admitting: Oncology

## 2023-03-07 DIAGNOSIS — R9431 Abnormal electrocardiogram [ECG] [EKG]: Secondary | ICD-10-CM | POA: Insufficient documentation

## 2023-03-07 DIAGNOSIS — F5101 Primary insomnia: Secondary | ICD-10-CM

## 2023-03-07 DIAGNOSIS — M47816 Spondylosis without myelopathy or radiculopathy, lumbar region: Secondary | ICD-10-CM

## 2023-03-07 MED ORDER — DIAZEPAM 5 MG PO TABS: 2.5000 mg | ORAL_TABLET | Freq: Every evening | ORAL | 0 refills | Status: DC | PRN

## 2023-03-07 MED ORDER — TIZANIDINE HCL 4 MG PO TABS: 4.0000 mg | ORAL_TABLET | Freq: Four times a day (QID) | ORAL | 0 refills | Status: DC | PRN

## 2023-03-07 NOTE — Telephone Encounter (Signed)
Please review.  KP

## 2023-03-10 ENCOUNTER — Other Ambulatory Visit: Payer: Self-pay | Admitting: Internal Medicine

## 2023-03-10 DIAGNOSIS — M47816 Spondylosis without myelopathy or radiculopathy, lumbar region: Secondary | ICD-10-CM

## 2023-03-11 ENCOUNTER — Ambulatory Visit (INDEPENDENT_AMBULATORY_CARE_PROVIDER_SITE_OTHER): Payer: Medicare PPO

## 2023-03-11 VITALS — BP 92/58 | Ht 63.0 in | Wt 161.6 lb

## 2023-03-11 DIAGNOSIS — Z780 Asymptomatic menopausal state: Secondary | ICD-10-CM

## 2023-03-11 DIAGNOSIS — Z0000 Encounter for general adult medical examination without abnormal findings: Secondary | ICD-10-CM | POA: Diagnosis not present

## 2023-03-11 NOTE — Patient Instructions (Addendum)
Sarah [name] , Thank you for taking time to come for your Medicare Wellness Visit. I appreciate your ongoing commitment to your health goals. Please review the following plan we discussed and let me know if I can assist you in the future.   Referrals/Orders/Follow-Ups/Clinician Recommendations: none  This is a list of the screening recommended for you and due dates:  Health Maintenance  Topic Date Due   Eye exam for diabetics  07/26/2020   DEXA scan (bone density measurement)  Never done   Flu Shot  01/30/2023   COVID-19 Vaccine (5 - 2023-24 season) 03/02/2023   Hemoglobin A1C  06/12/2023   Mammogram  12/04/2023   Yearly kidney health urinalysis for diabetes  12/09/2023   Complete foot exam   12/09/2023   Yearly kidney function blood test for diabetes  02/27/2024   Medicare Annual Wellness Visit  03/10/2024   Colon Cancer Screening  12/20/2027   DTaP/Tdap/Td vaccine (8 - Td or Tdap) 01/23/2032   Pneumonia Vaccine  Completed   Hepatitis C Screening  Completed   Zoster (Shingles) Vaccine  Completed   HPV Vaccine  Aged Out    Advanced directives: (ACP Link)Information on Advanced Care Planning can be found at [facility] Advance Health Care Directives Advance Health Care Directives ([email or web address]   Next Medicare Annual Wellness Visit scheduled for next year: Yes   03/16/24 @ 8:15 am in person

## 2023-03-11 NOTE — Progress Notes (Signed)
Subjective:   Sarah [name] is a 67 y.o. female who presents for Medicare Annual (Subsequent) preventive examination.  Visit Complete: In person  Review of Systems     Cardiac Risk Factors include: advanced age (>20men, >54 women);diabetes mellitus;dyslipidemia;hypertension     Objective:    Today's Vitals   03/11/23 0814 03/11/23 0817  BP: (!) 92/58   Weight: 161 lb 9.6 oz (73.3 kg)   Height: 5\' 3"  (1.6 m)   PainSc:  7    Body mass index is 28.63 kg/m.     03/11/2023    8:28 AM 02/27/2023    8:47 AM 10/21/2022    9:38 AM 10/15/2022    1:39 PM 09/11/2022   10:14 AM 08/27/2022    1:12 PM 08/12/2022    9:10 AM  Advanced Directives  Does Patient Have a Medical Advance Directive? No No No No Yes Yes Yes  Type of Passenger transport manager Power of [employer] Power of Attorney  Does patient want to make changes to medical advance directive?  No - Patient declined    No - Patient declined   Copy of Healthcare Power of Attorney in Chart?   No - copy requested  No - copy requested  No - copy requested  Would patient like information on creating a medical advance directive? No - Patient declined No - Patient declined  No - Patient declined No - Patient declined      Current Medications (verified) Outpatient Encounter Medications as of 03/11/2023  Medication Sig   allopurinol (ZYLOPRIM) 100 MG tablet TAKE 1 TABLET(100 MG) BY MOUTH DAILY as needed   amiodarone (PACERONE) 200 MG tablet Take 200 mg by mouth daily.   atorvastatin (LIPITOR) 10 MG tablet Take 0.5 tablets (5 mg total) by mouth daily.   bisoprolol (ZEBETA) 10 MG tablet Take 1 tablet (10 mg total) by mouth daily.   calcium carbonate (OS-CAL) 1250 (500 Ca) MG chewable tablet Chew 2 tablets by mouth daily.   diazepam (VALIUM) 5 MG tablet Take 0.5-1 tablets (2.5-5 mg total) by mouth at bedtime as needed (insomnia).   diclofenac sodium (VOLTAREN) 1 % GEL  Apply 2 g topically 4 (four) times daily.   furosemide (LASIX) 40 MG tablet Take 0.5 tablets (20 mg total) by mouth daily. Home dose.   glipiZIDE (GLUCOTROL XL) 2.5 MG 24 hr tablet Take 1 tablet (2.5 mg total) by mouth daily with breakfast.   glucose blood test strip    lidocaine (XYLOCAINE) 2 % solution Use as directed 15 mLs in the mouth or throat every 3 (three) hours as needed for mouth pain (swish and spit).   losartan (COZAAR) 25 MG tablet Take 1 tablet by mouth daily.   magnesium chloride (SLOW-MAG) 64 MG TBEC SR tablet Take 1 tablet (64 mg total) by mouth daily.   Multiple Vitamin (MULTIVITAMIN) tablet Take 1 tablet by mouth daily.   omeprazole (PRILOSEC) 40 MG capsule TAKE 1 CAPSULE IN THE MORNING AND TAKE 1 CAPSULE AT BEDTIME   pentoxifylline (TRENTAL) 400 MG CR tablet Take 400 mg by mouth 3 (three) times daily with meals.   potassium chloride SA (KLOR-CON M) 20 MEQ tablet Take 1 tablet (20 mEq total) by mouth daily.   promethazine (PHENERGAN) 25 MG tablet Take 1 tablet (25 mg total) by mouth every 6 (six) hours as needed for nausea or vomiting.   spironolactone (ALDACTONE) 25 MG tablet Take by mouth.  tiZANidine (ZANAFLEX) 4 MG tablet TAKE 1 TABLET(4 MG) BY MOUTH EVERY 6 HOURS AS NEEDED FOR MUSCLE SPASMS   traMADol (ULTRAM) 50 MG tablet    traZODone (DESYREL) 100 MG tablet TAKE 1 TABLET AT BEDTIME   vitamin E 45 MG (100 UNITS) capsule Take by mouth daily.   diphenoxylate-atropine (LOMOTIL) 2.5-0.025 MG tablet Take 2 tablets by mouth 4 (four) times daily as needed for diarrhea or loose stools. (Patient not taking: Reported on 02/27/2023)   empagliflozin (JARDIANCE) 10 MG TABS tablet Take 1 tablet by mouth daily. (Patient not taking: Reported on 03/11/2023)   ENTRESTO 24-26 MG Take 1 tablet by mouth 2 (two) times daily. (Patient not taking: Reported on 02/24/2023)   venlafaxine (EFFEXOR) 75 MG tablet Take 75 mg by mouth daily. (Patient not taking: Reported on 03/11/2023)    Facility-Administered Encounter Medications as of 03/11/2023  Medication   sodium chloride flush (NS) 0.9 % injection 10 mL    Allergies (verified) Oxycodone-acetaminophen, Celebrex [celecoxib], Codeine, Plerixafor, Benadryl [diphenhydramine], Morphine, Ondansetron, and Tylenol [acetaminophen]   History: Past Medical History:  Diagnosis Date   Anemia    Anxiety    Aortic stenosis    a. 05/2020 Echo: EF 45-50%, sev AS - seen by [facility]R team @ [facility] - CTA sugg of tricuspid valve w/ fusing of 2 leaflets-TAVR deferred in setting of acute infxn; b. 07/2020 Echo: EF 50-55%, mod-sev AS; c. 12/2020 Echo: EF>55%. Mod-sev paradoxical low-flow low-gradient AS; d. 08/2021 Echo: EF 35-40%, severe AS, triv AI, mild MR.   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 Variable EF over time; b. 08/2017 Echo: EF 40%; b. 03/2020 Echo: EF 55-60%; c. 05/2020 Echo: EF 45-50%; d. 07/2020 Echo: EF 50-55%; e. 12/2020 Echo: EF>55%; f. 08/2021 Echo: EF 35-40%.   Chronic heart failure with preserved ejection fraction (HFpEF) (HCC)    a. Variable EF over time; b. 08/2017 Echo: EF 40%; b. 03/2020 Echo: EF 55-60%; c. 05/2020 Echo: EF 45-50%; d. 07/2020 Echo: EF 50-55%; e. 12/2020 Echo: EF>55%; f. 08/2021 Echo: EF 35-40%, no RV, mildly dil LA, mild MR, triv AI, severe AS.   CKD (chronic kidney disease) stage 3, GFR 30-59 ml/min (HCC)    Depression    Diabetes mellitus (HCC)    Dizziness    Fatty liver    Frequent falls    GERD (gastroesophageal reflux disease)    Gout    Heart murmur    History of blood transfusion    History of bone marrow transplant (HCC)    History of uterine fibroid    Hx of cardiac catheterization    a. 01/2016 Cath [facility] - after abnl nuc): Nl cors.   Hypertension    Hypomagnesemia    IDA (iron deficiency anemia)    Multiple myeloma (HCC)    NSTEMI (non-ST elevated myocardial  infarction) (HCC) 08/21/2021   Personal history of chemotherapy    Pleurisy 04/09/2022   PSVT (paroxysmal supraventricular tachycardia)    a. S/p RFCA [facility]).   PVC's (premature ventricular contractions)    a. Well-managed w/ bisoprolol in outpt setting.   Renal cyst    Past Surgical History:  Procedure Laterality Date   ABDOMINAL HYSTERECTOMY     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   CARDIAC ELECTROPHYSIOLOGY MAPPING AND ABLATION     CARPAL TUNNEL RELEASE Bilateral    CHOLECYSTECTOMY  2008   COLONOSCOPY WITH PROPOFOL N/A 05/07/2017   Procedure: COLONOSCOPY WITH PROPOFOL;  Surgeon: Wyline Mood, MD;  Location: [facility] ENDOSCOPY;  Service: Gastroenterology;  Laterality: N/A;   COLONOSCOPY WITH PROPOFOL N/A 12/19/2020   Procedure: COLONOSCOPY WITH PROPOFOL;  Surgeon: Wyline Mood, MD;  Location: [facility] ENDOSCOPY;  Service: Gastroenterology;  Laterality: N/A;   ESOPHAGOGASTRODUODENOSCOPY (EGD) WITH PROPOFOL N/A 05/07/2017   Procedure: ESOPHAGOGASTRODUODENOSCOPY (EGD) WITH PROPOFOL;  Surgeon: Wyline Mood, MD;  Location: [facility] ENDOSCOPY;  Service: Gastroenterology;  Laterality: N/A;   FOOT SURGERY Bilateral    INCONTINENCE SURGERY  2009   INTERSTIM IMPLANT PLACEMENT     other     over active bladder   OTHER SURGICAL HISTORY     bladder stimulator    PARTIAL HYSTERECTOMY  03/1996   fibroids   PORTA CATH INSERTION N/A 03/10/2019   Procedure: PORTA CATH INSERTION;  Surgeon: Annice Needy, MD;  Location: [facility] INVASIVE CV LAB;  Service: Cardiovascular;  Laterality: N/A;   TONSILLECTOMY  2007   Family History  Problem Relation Age of Onset   Colon cancer Father    Renal Disease Father    Diabetes Mellitus II Father    Melanoma Paternal Grandmother    Breast cancer Maternal Aunt 33   Anemia Mother    Heart disease Mother    Heart failure Mother    Renal Disease Mother    Congestive Heart Failure Mother    Heart disease Maternal Uncle    Throat cancer Maternal Uncle    Lung cancer  Maternal Uncle    Liver disease Maternal Uncle    Heart failure Maternal Uncle    Hearing loss Son 37       Suicide    Social History   Socioeconomic History   Marital status: Widowed    Spouse name: Not on file   Number of children: 2   Years of education: Not on file   Highest education level: Not on file  Occupational History   Occupation: Disabled  Tobacco Use   Smoking status: Former    Current packs/day: 0.00    Average packs/day: 1 pack/day for 20.0 years (20.0 ttl pk-yrs)    Types: Cigarettes    Start date: 07/02/1971    Quit date: 07/02/1991    Years since quitting: 31.7   Smokeless tobacco: Never  Vaping Use   Vaping status: Never Used  Substance and Sexual Activity   Alcohol use: Yes    Alcohol/week: 2.0 standard drinks of alcohol    Types: 2 Cans of beer per week   Drug use: No   Sexual activity: Never  Other Topics Concern   Not on file  Social History Narrative   Pt lives with her 2 sisters   Social Determinants of Health   Financial Resource Strain: Medium Risk (03/11/2023)   Overall Financial Resource Strain (CARDIA)    Difficulty of Paying Living Expenses: Somewhat hard  Food Insecurity: No Food Insecurity (03/11/2023)   Hunger Vital Sign    Worried About Running Out of Food in the Last Year: Never true    Ran Out of Food in the Last Year: Never true  Transportation Needs: No Transportation Needs (03/11/2023)   PRAPARE - Administrator, Civil Service (Medical): No    Lack of Transportation (Non-Medical): No  Physical Activity: Insufficiently Active (03/11/2023)   Exercise Vital Sign    Days of Exercise per Week: 5 days    Minutes of Exercise per Session: 20 min  Stress: No Stress Concern Present (03/11/2023)   [employer] of Occupational Health - Occupational Stress Questionnaire    Feeling of Stress : Only a little  Social Connections: Socially Isolated (03/11/2023)   Social Connection and Isolation Panel [NHANES]    Frequency of  Communication with Friends and Family: Once a week    Frequency of Social Gatherings with Friends and Family: More than three times a week    Attends Religious Services: Never    Database administrator or Organizations: No    Attends Banker Meetings: Never    Marital Status: Widowed    Tobacco Counseling Counseling given: Not Answered   Clinical Intake:  Pre-visit preparation completed: Yes  Pain : 0-10 Pain Score: 7  Pain Type: Chronic pain Pain Location: Back Pain Orientation: Lower Pain Descriptors / Indicators: Aching, Throbbing Pain Onset: More than a month ago Pain Frequency: Intermittent Pain Relieving Factors: aleve  Pain Relieving Factors: aleve  Nutritional Status: BMI 25 -29 Overweight Nutritional Risks: None Diabetes: Yes CBG done?: No Did pt. bring in CBG monitor from home?: No  How often do you need to have someone help you when you read instructions, pamphlets, or other written materials from your doctor or pharmacy?: 1 - Never  Interpreter Needed?: No  Information entered by :: Kennedy Bucker, LPN   Activities of Daily Living    03/11/2023    8:30 AM 04/09/2022    2:14 PM  In your present state of health, do you have any difficulty performing the following activities:  Hearing? 0 0  Vision? 0 0  Difficulty concentrating or making decisions? 0 0  Walking or climbing stairs? 1 0  Comment legs and back ache   Dressing or bathing? 0 0  Doing errands, shopping? 0 0  Preparing Food and eating ? N   Using the Toilet? N   In the past six months, have you accidently leaked urine? N   Do you have problems with loss of bowel control? N   Managing your Medications? N   Managing your Finances? N   Housekeeping or managing your Housekeeping? N     Patient Care Team: [name], MD as PCP - General (Internal Medicine) Shiela Mayer, MD as Referring Physician (Pain Medicine) Lurene Shadow, MD as Consulting Physician  (Cardiology) Rickard Patience, MD as Consulting Physician (Oncology)  Indicate any recent Medical Services you may have received from other than [facility] providers in the past year (date may be approximate).     Assessment:   This is a routine wellness examination for [place].  Hearing/Vision screen Hearing Screening - Comments:: No aids Vision Screening - Comments:: Wears glasses- [facility]   Goals Addressed             This Visit's Progress    DIET - EAT MORE FRUITS AND VEGETABLES         Depression Screen    03/11/2023    8:25 AM 12/09/2022    9:50 AM 10/17/2022    2:12 PM 09/26/2022    8:16 AM 08/29/2022    1:30 PM 03/01/2022    8:24 AM 12/07/2021    9:32 AM  PHQ 2/9 Scores  PHQ - 2 Score 2 1 1 2 3 3 4   PHQ- 9 Score 2 8 5 8 8 10 8     Fall Risk    03/11/2023    8:29 AM 12/09/2022    9:50 AM 10/17/2022    2:12 PM 08/27/2022  1:07 PM 03/01/2022    8:30 AM  Fall Risk   Falls in the past year? 1 1 1 1  0  Number falls in past yr: 1 1 1  0   Comment    tripped over dogs   Injury with Fall? 0 0 0 0   Risk for fall due to : History of fall(s) No Fall Risks History of fall(s)    Follow up Falls prevention discussed;Falls evaluation completed Falls evaluation completed Falls evaluation completed      MEDICARE RISK AT HOME: Medicare Risk at Home Any stairs in or around the home?: Yes If so, are there any without handrails?: No Home free of loose throw rugs in walkways, pet beds, electrical cords, etc?: Yes Adequate lighting in your home to reduce risk of falls?: Yes Life alert?: No Use of a cane, walker or w/c?: No Grab bars in the bathroom?: No Shower chair or bench in shower?: No Elevated toilet seat or a handicapped toilet?: No  TIMED UP AND GO:  Was the test performed?  Yes  Length of time to ambulate 10 feet: 4 sec Gait steady and fast without use of assistive device    Cognitive Function:        03/11/2023    8:31 AM 03/01/2022    8:30 AM  6CIT Screen  What Year?   0 points  What month? 0 points 0 points  What time? 0 points 0 points  Count back from 20 0 points 0 points  Months in reverse 0 points 0 points  Repeat phrase 4 points 0 points  Total Score  0 points    Immunizations Immunization History  Administered Date(s) Administered   DTaP / Hep B / IPV 11/14/2020, 07/24/2021, 01/22/2022   Fluad Quad(high Dose 65+) 03/13/2021   HIB (PRP-T) 07/09/2016, 09/10/2016, 07/08/2017, 11/14/2020, 07/24/2021, 01/22/2022   Hepatitis B, ADULT 07/09/2016, 09/10/2016, 07/08/2017   IPV 07/09/2016, 09/10/2016, 07/08/2017   Influenza,inj,Quad PF,6+ Mos 03/23/2015, 06/18/2016, 04/08/2017, 06/30/2017, 03/31/2018, 04/12/2019, 04/03/2020   Influenza-Unspecified 07/12/2022   MMR 07/08/2017, 05/28/2022   PFIZER(Purple Top)SARS-COV-2 Vaccination 09/20/2019, 10/09/2019, 01/09/2021, 07/12/2022   PNEUMOCOCCAL CONJUGATE-20 07/24/2021, 01/22/2022, 05/28/2022   Pneumococcal Conjugate-13 03/23/2015, 07/09/2016, 01/07/2017, 03/11/2017, 11/14/2020   Pneumococcal Polysaccharide-23 07/08/2017   Rabies, IM 10/15/2022, 10/18/2022, 10/22/2022, 10/29/2022   Td 08/20/2020   Tdap 07/09/2016, 09/10/2016, 02/12/2018   Zoster Recombinant(Shingrix) 11/19/2017, 02/12/2018    TDAP status: Up to date  Flu Vaccine status: Up to date  Pneumococcal vaccine status: Up to date  Covid-19 vaccine status: Completed vaccines  Qualifies for Shingles Vaccine? Yes   Zostavax completed No   Shingrix Completed?: Yes  Screening Tests Health Maintenance  Topic Date Due   OPHTHALMOLOGY EXAM  07/26/2020   DEXA SCAN  Never done   INFLUENZA VACCINE  01/30/2023   COVID-19 Vaccine (5 - 2023-24 season) 03/02/2023   HEMOGLOBIN A1C  06/12/2023   MAMMOGRAM  12/04/2023   Diabetic kidney evaluation - Urine ACR  12/09/2023   FOOT EXAM  12/09/2023   Diabetic kidney evaluation - eGFR measurement  02/27/2024   Medicare Annual Wellness (AWV)  03/10/2024   Colonoscopy  12/20/2027   DTaP/Tdap/Td (8 - Td  or Tdap) 01/23/2032   Pneumonia Vaccine 62+ Years old  Completed   Hepatitis C Screening  Completed   Zoster Vaccines- Shingrix  Completed   HPV VACCINES  Aged Out    Health Maintenance  Health Maintenance Due  Topic Date Due   OPHTHALMOLOGY EXAM  07/26/2020   DEXA SCAN  Never done   INFLUENZA VACCINE  01/30/2023   COVID-19 Vaccine (5 - 2023-24 season) 03/02/2023    Colorectal cancer screening: Type of screening: Colonoscopy. Completed 12/19/20. Repeat every 7 years  Mammogram status: Completed 12/04/22. Repeat every year  Bone Density status: Ordered 03/11/23. Pt provided with contact info and advised to call to schedule appt.  Lung Cancer Screening: (Low Dose CT Chest recommended if Age 72-80 years, 20 pack-year currently smoking OR have quit w/in 15years.) does not qualify.   Additional Screening:  Hepatitis C Screening: does qualify; Completed 06/11/18 Vision Screening: Recommended annual ophthalmology exams for early detection of glaucoma and other disorders of the eye. Is the patient up to date with their annual eye exam?  Yes  Who is the provider or what is the name of the office in which the patient attends annual eye exams? [facility] If pt is not established with a provider, would they like to be referred to a provider to establish care? No .   Dental Screening: Recommended annual dental exams for proper oral hygiene  Community Resource Referral / Chronic Care Management: CRR required this visit?  No   CCM required this visit?  No     Plan:     I have personally reviewed and noted the following in the patient's chart:   Medical and social history Use of alcohol, tobacco or illicit drugs  Current medications and supplements including opioid prescriptions. Patient is not currently taking opioid prescriptions. Functional ability and status Nutritional status Physical activity Advanced directives List of other physicians Hospitalizations, surgeries, and ER  visits in previous 12 months Vitals Screenings to include cognitive, depression, and falls Referrals and appointments  In addition, I have reviewed and discussed with patient certain preventive protocols, quality metrics, and best practice recommendations. A written personalized care plan for preventive services as well as general preventive health recommendations were provided to patient.     Hal Hope, LPN   [id number]   After Visit Summary: mychart  Nurse Notes: none

## 2023-03-12 ENCOUNTER — Other Ambulatory Visit: Payer: Self-pay

## 2023-03-12 DIAGNOSIS — C9001 Multiple myeloma in remission: Secondary | ICD-10-CM

## 2023-03-12 LAB — MISCELLANEOUS TEST

## 2023-03-13 ENCOUNTER — Inpatient Hospital Stay: Payer: Medicare PPO

## 2023-03-13 ENCOUNTER — Encounter: Payer: Self-pay | Admitting: Oncology

## 2023-03-13 ENCOUNTER — Inpatient Hospital Stay (HOSPITAL_BASED_OUTPATIENT_CLINIC_OR_DEPARTMENT_OTHER): Payer: Medicare PPO | Admitting: Oncology

## 2023-03-13 VITALS — BP 136/98 | HR 77 | Temp 96.0°F | Resp 18 | Wt 160.9 lb

## 2023-03-13 DIAGNOSIS — R6884 Jaw pain: Secondary | ICD-10-CM | POA: Diagnosis not present

## 2023-03-13 DIAGNOSIS — G620 Drug-induced polyneuropathy: Secondary | ICD-10-CM | POA: Diagnosis not present

## 2023-03-13 DIAGNOSIS — Z7682 Awaiting organ transplant status: Secondary | ICD-10-CM | POA: Diagnosis not present

## 2023-03-13 DIAGNOSIS — D500 Iron deficiency anemia secondary to blood loss (chronic): Secondary | ICD-10-CM

## 2023-03-13 DIAGNOSIS — E538 Deficiency of other specified B group vitamins: Secondary | ICD-10-CM | POA: Diagnosis not present

## 2023-03-13 DIAGNOSIS — K521 Toxic gastroenteritis and colitis: Secondary | ICD-10-CM | POA: Diagnosis not present

## 2023-03-13 DIAGNOSIS — T451X5A Adverse effect of antineoplastic and immunosuppressive drugs, initial encounter: Secondary | ICD-10-CM | POA: Diagnosis not present

## 2023-03-13 DIAGNOSIS — G629 Polyneuropathy, unspecified: Secondary | ICD-10-CM

## 2023-03-13 DIAGNOSIS — C9001 Multiple myeloma in remission: Secondary | ICD-10-CM | POA: Diagnosis not present

## 2023-03-13 DIAGNOSIS — I5032 Chronic diastolic (congestive) heart failure: Secondary | ICD-10-CM | POA: Diagnosis not present

## 2023-03-13 LAB — CMP (CANCER CENTER ONLY)
ALT: 29 U/L (ref 0–44)
AST: 26 U/L (ref 15–41)
Albumin: 4.3 g/dL (ref 3.5–5.0)
Alkaline Phosphatase: 99 U/L (ref 38–126)
Anion gap: 7 (ref 5–15)
BUN: 18 mg/dL (ref 8–23)
CO2: 26 mmol/L (ref 22–32)
Calcium: 8.9 mg/dL (ref 8.9–10.3)
Chloride: 105 mmol/L (ref 98–111)
Creatinine: 1.24 mg/dL — ABNORMAL HIGH (ref 0.44–1.00)
GFR, Estimated: 48 mL/min — ABNORMAL LOW (ref >60)
Glucose, Bld: 122 mg/dL — ABNORMAL HIGH (ref 70–99)
Potassium: 4.1 mmol/L (ref 3.5–5.1)
Sodium: 138 mmol/L (ref 135–145)
Total Bilirubin: 0.4 mg/dL (ref 0.3–1.2)
Total Protein: 7.3 g/dL (ref 6.5–8.1)

## 2023-03-13 LAB — CBC WITH DIFFERENTIAL (CANCER CENTER ONLY)
Abs Immature Granulocytes: 0.02 10*3/uL (ref 0.00–0.07)
Basophils Absolute: 0 10*3/uL (ref 0.0–0.1)
Basophils Relative: 1 %
Eosinophils Absolute: 0.1 10*3/uL (ref 0.0–0.5)
Eosinophils Relative: 2 %
HCT: 33.6 % — ABNORMAL LOW (ref 36.0–46.0)
Hemoglobin: 11.1 g/dL — ABNORMAL LOW (ref 12.0–15.0)
Immature Granulocytes: 0 %
Lymphocytes Relative: 33 %
Lymphs Abs: 2 10*3/uL (ref 0.7–4.0)
MCH: 28.6 pg (ref 26.0–34.0)
MCHC: 33 g/dL (ref 30.0–36.0)
MCV: 86.6 fL (ref 80.0–100.0)
Monocytes Absolute: 0.5 10*3/uL (ref 0.1–1.0)
Monocytes Relative: 8 %
Neutro Abs: 3.3 10*3/uL (ref 1.7–7.7)
Neutrophils Relative %: 56 %
Platelet Count: 105 10*3/uL — ABNORMAL LOW (ref 150–400)
RBC: 3.88 MIL/uL (ref 3.87–5.11)
RDW: 13.9 % (ref 11.5–15.5)
WBC Count: 6 10*3/uL (ref 4.0–10.5)
nRBC: 0 % (ref 0.0–0.2)

## 2023-03-13 LAB — MAGNESIUM: Magnesium: 1.3 mg/dL — ABNORMAL LOW (ref 1.7–2.4)

## 2023-03-13 MED ORDER — SODIUM CHLORIDE 0.9% FLUSH
10.0000 mL | Freq: Once | INTRAVENOUS | Status: AC | PRN
  Administered 2023-03-13: 10 mL
  Filled 2023-03-13: qty 10

## 2023-03-13 MED ORDER — HEPARIN SOD (PORK) LOCK FLUSH 100 UNIT/ML IV SOLN
500.0000 [IU] | Freq: Once | INTRAVENOUS | Status: AC | PRN
  Administered 2023-03-13: 500 [IU]
  Filled 2023-03-13: qty 5

## 2023-03-13 MED ORDER — SODIUM CHLORIDE 0.9 % IV SOLN
25.0000 mg | Freq: Once | INTRAVENOUS | Status: AC
  Administered 2023-03-13: 25 mg via INTRAVENOUS
  Filled 2023-03-13: qty 1

## 2023-03-13 MED ORDER — CYANOCOBALAMIN 1000 MCG/ML IJ SOLN
1000.0000 ug | Freq: Once | INTRAMUSCULAR | Status: AC
  Administered 2023-03-13: 1000 ug via INTRAMUSCULAR

## 2023-03-13 MED ORDER — MAGNESIUM SULFATE 4 GM/100ML IV SOLN
4.0000 g | Freq: Once | INTRAVENOUS | Status: AC
  Administered 2023-03-13: 4 g via INTRAVENOUS
  Filled 2023-03-13: qty 100

## 2023-03-13 MED ORDER — MAGNESIUM SULFATE 2 GM/50ML IV SOLN
2.0000 g | Freq: Once | INTRAVENOUS | Status: AC
  Administered 2023-03-13: 2 g via INTRAVENOUS
  Filled 2023-03-13: qty 50

## 2023-03-13 MED ORDER — SODIUM CHLORIDE 0.9 % IV SOLN
Freq: Once | INTRAVENOUS | Status: AC
  Filled 2023-03-13: qty 250

## 2023-03-13 NOTE — Assessment & Plan Note (Signed)
-

## 2023-03-13 NOTE — Assessment & Plan Note (Signed)
#  Chronic hypomagnesemia proceed with  IV magnesium today.  Continue weekly magnesium +/- IV magnesium.

## 2023-03-13 NOTE — Progress Notes (Signed)
Hematology/Oncology Progress note [phone] [phone]     Chief Complaint: Sarah [name] is a 67 y.o. 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  Light chain ratio is normal. 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 Repeat immunofixation negative. . Labs reviewed and discussed with patient, stable M protein and stable light chain ratio Off Daratumumab due to frequent infections despite IVIG Continue acyclovir prophylaxis.    Iron deficiency anemia due to chronic blood loss Lab Results  Component Value Date   HGB 11.1 (L) 03/13/2023   TIBC 375 12/02/2022   IRONPCTSAT 18 12/02/2022   FERRITIN 26 12/02/2022    S/p Venofer treatments. Hb has improved. Observation.    Hypomagnesemia #Chronic hypomagnesemia proceed with  IV magnesium today.  Continue weekly magnesium +/- IV magnesium.     B12 deficiency B12 level normal.  continue B12 injections Q 6 weeks.   Stem cell transplant candidate -[facility]- Post transplant RN coordinator [phone]).  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   Previously on Lyrica and nortriptyline-not effective Neurology recommend Tramadol and may consider higher dose of  Nortriptyline in the future.  Follows up with neurology.  Jaw pain Recommend patient to get dental evaluation.  Obtain CT maxillofacial wo contrast.     Orders Placed This Encounter  Procedures   CBC with Differential (Cancer Center Only)    Standing Status:   Future    Standing Expiration Date:    03/12/2024   CMP (Cancer Center only)    Standing Status:   Future    Standing Expiration Date:   03/12/2024   Kappa/lambda light chains    Standing Status:   Future    Standing Expiration Date:   03/12/2024   Multiple Myeloma Panel (SPEP&IFE w/QIG)    Standing Status:   Future    Standing Expiration Date:   03/12/2024   Magnesium    Standing Status:   Future    Standing Expiration Date:   03/12/2024    Follow up  Lab Mag weekly x5  + IV mag.  6 weeks flex lab MD IV mag,  same labs  All questions were answered. The patient knows to call the clinic with any problems, questions or concerns.  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   Initial bone marrow revealed 80% plasma cells.  Lambda free light chains were 1340.  She had nephrotic range proteinuria.  She initially underwent induction with RVD.  Revlimid maintenance was discontinued on 01/21/2017 secondary to intolerance.     07/19/2019 Bone marrow aspirate and biopsy on  revealed a normocellular marrow with but  increased lambda-restricted plasma cells (9% aspirate, 40% CD138 immunohistochemistry).  Findings were consistent with recurrent plasma cell myeloma.  Flow cytometry revealed no monoclonal B-cell or phenotypically aberrant T-cell population. Cytogenetics were 84, XX (normal).  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    Lambda light chains have been followed: 22.2 (ratio 0.56) on  07/03/2017, 30.8 (ratio 0.78) on 09/02/2017, 36.9 (ratio 0.40) on 10/21/2017, 37.4 (ratio 0.41) on 12/16/2017, 70.7(ratio 0.31)  on 02/17/2018, 64.2 (ratio 0.27) on 04/07/2018, 78.9 (ratio 0.18) on 05/26/2018, 128.8 (ratio 0.17) on 08/06/2018, 181.5 (ratio 0.13) on 10/08/2018, 130.9 (ratio 0.13) on 10/20/2018, 160.7 (ratio 0.10) on 12/09/2018, 236.6 (ratio 0.07) on 02/01/2019, 363.6 (ratio 0.04) on 03/22/2019, 404.8 (ratio 0.04) on 04/05/2019, 420.7 (ratio 0.03) on 05/24/2019, 573.4 (ratio 0.03) on 06/23/2019, 451.05 (ratio 0.02) on 08/20/2019, 47.2 (ratio 0.13) on 10/25/2019, 22.4 (ratio 0.21) on 11/25/2019, 16.5 (ratio 0.33) on 01/10/2020, 14.6 (ratio 0.34) on 02/07/2020, 13.1 (ratio 0.31) on 03/07/2020, 10.1 (ratio 0.38) on 04/10/2020, and 9.5 (ratio 0.21) on 06/19/2020.   24 hour UPEP on 06/03/2019 revealed kappa free light chains 95.76, lambda free light chains 1,260.71, and ratio 0.08.  24 hour UPEP on 08/23/2019 revealed total protein of 782 mg/24 hrs with lambda free light chains 1,084.16 mg/L and ratio of 0.10 (1.03-31.76).  M spike in urine was 46.1% (361 mg/24 hrs).    Bone survey on 04/08/2016 and 05/28/2017 revealed no definite lytic lesion seen in the visualized skeleton.  Bone survey on 11/19/2018 revealed no suspicious lucent lesions and no acute bony abnormality.  PET scan on 07/12/2019 revealed no focal metabolic activity to suggest active myeloma within the skeleton. There were no lytic lesions identified on the CT portion of the exam or soft tissue plasmacytomas. There was no evidence of multiple myeloma.    Pretreatment RBC phenotype on 09/23/2019 was positive for C, e, DUFFY B, KIDD B, M, S, and s antigen; negative for c, E, KELL, DUFFY A, KIDD A, and N antigen.    09/27/2019 - 10/25/2019; 12/09/2019 - 03/13/2020 6 cycles of daratumumab and hyaluronidase-fihj, Pomalyst, and Decadron (DPd) .  Cycle #1 was complicated by fever and neutropenia requiring admission.  Cycle #2 was complicated  by pneumonia requiring admission.  Cycle #6 was complicated with an ER evaluation for an elevated lactic acid.   05/10/2020 second autologous stem cell transplant [facility]   She underwent conditioning with melphalan 140 mg/m2.    She is s/p week #3 Velcade maintenance (began 08/31/2020 - 09/28/2020).  She has no increase in neuropathy.  She has severe aortic stenosis.  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 Cardiology is following for a possible TAVR in the future.  Echo on 07/05/2020 revealed moderate to severe aortic stenosis with an EF of 50-55%.  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 Zometa was discontinued in 01/2017.  She has chronic nausea on Phenergan.     B12 deficiency.  B12 was 254 on 04/09/2017, 295 on 08/20/2018, and 391 on 10/08/2018.  She was on oral B12.  She received B12 monthly (last 09/14/2020).  Folate was 12.1 on 01/10/2020.    She has iron deficiency.  Ferritin was 32 on 07/01/2019.  She received Venofer on 07/15/2019 and 07/22/2019.   She has hypogammaglobulinemia.  IgG was 245 on 11/25/2019.  She received monthly IVIG (07/22/021 - 03/22/2020).  She received IVIG 400 mg/kg on 01/20/2020, 200  mg/kg on 02/17/2020, and 300 mg/kg on 03/22/2020.  IVIG on 01/20/2020 was complicated by acute renal failure. IgG trough level was 418 on 02/17/2020.  10/15/2019 - 10/20/2019 She was admitted to [facility] with fever and neutropenia.  Cultures were negative.  CXR was negative. She received broad spectrum antibiotics and daily Granix.  She received IVF for acute renal insufficiency due to diarrhea and dehydration.  Creatine was 1.66 on admission and 1.12 on discharge.    03/01/2020 - 03/05/2020 admitted to [facility] from with fever and neutropenia. CXR revealed no active cardiopulmonary disease. Chest CT with contrast revealed no acute intrathoracic pathology. There were findings which could be  suggestive of prior granulomatous disease. She was treated with Cefepime and Vancomycin, then switched to ciprofloxacin on 03/03/2020.   05/08/2020 - 12/05/2021The patient was admitted to [facility] from  for autologous bone marrow transplant.   She received conditioning with melphalan 140 mg/m2.  Bone marrow transplant was on 05/10/2020. The patient developed fevers daily from 05/19/2020 - 05/24/2020. Blood cultures were + for strept sanguis bacteremia.  She was treated cefepime, vancomycin, and solumedrol for possible engraftment syndrome. Cefepime was switched to ceftriaxone; she completed 2 weeks of ceftriaxone on 06/03/2020. She had chemotherapy induced diarrhea.  She experienced urinary retention.  Feb 2022 patient has been on maintenance Velcade 2 mg every 2 weeks.  Chronic diarrhea seen by gastroenterology Dr. Tobi Bastos and was recommended to avoid artificial sugars in her diet including Diet Coke and coffee mate.  She feels some improvement of her diarrhea frequency since the dietary modification.  GI profile PCR negative, C. difficile toxin A+ B negative, fecal calprotectin 77 12/19/2020 colonoscopy showed 2 subcentimeter polyps in the ascending colon, resected and removed.  Pathology showed tubular adenoma.  No malignancy.  Normal mucosa in the entire examined colon.  Biopsied, pathology showed colonic mucosa with no significant pathological alteration.  Negative for active inflammation and features of chronicity.  Negative for microscopic colitis, dysplasia, and malignanc  Diabetes, patient has discontinued metformin due to diarrhea and started on glipizide 2.5 mg   03/05/2021-03/09/2021 Patient was hospitalized due to fever and chills, nonproductive cough, shortness of breath. X-ray showed right lower lobe atelectasis versus pneumonia.  Blood cultures negative.  Urine culture showed 60,000 colonies of Enterococcus facialis.  Patient received antibiotics. Patient also had abdominal pelvis CT scan for  work-up of intermittent lower abdomen pain.No acute abnormality.   05/14/21 last dose of Velcade maintenance.  establish care with neurology Dr. Theora Master and her neuropathy regimen has been switched from gabapentin to Lyrica. 05/29/2021 patient got influenza   neuropathy medication has been switched from gabapentin/Lyrica to nortriptyline.  08/18/2021 Hospitalized due to pneumonia from metapnuemovirus, hospitalization was complicated with NSTEMI. She received heparin gtt.  Treated with antibiotics for coverage of pneumonia,  diuretics for pulmonary edema. She received IVIG 400mg /kg x 1 for hypoglobulinemia. Discharged on 08/25/21 with Augmentin and Zithromax.   May 2023, Daratumumab being held for [facility] infectious disease physician Dr. Kennedy Bucker due to frequent infection.  Diagnosed with CHF [HFrEF] -NYHA class II-III, she follows up with cardiology, started on entresto  hospitalization due to CHF exacerbation, community acquired pneumonia.   Presented to emergency room on 07/01/2022 Respiratory panel RT-PCR negative for COVID-19, influenza and RSV.   INTERVAL HISTORY Yides Argenbright is a 67 y.o. female who has above history reviewed by me today presents for follow up visit for management of multiple myeloma.  Patient has been on weekly magnesium infusion as needed hypomagnesemia + CHF [  nonischemic cardiomyopathy], aortic stenosis, follows up with cardiology. E coli and campylobacter enteritis resolved. She denies diarrhea.  + left jaw pain. Not on bisphosphonate due to history of previous jaw necrosis on zometa.    Past Medical History:  Diagnosis Date   Anemia    Anxiety    Aortic stenosis    a. 05/2020 Echo: EF 45-50%, sev AS - seen by TAVR team @ [facility] - CTA sugg of tricuspid valve w/ fusing of 2 leaflets-TAVR deferred in setting of acute infxn; b. 07/2020 Echo: EF 50-55%, mod-sev AS; c. 12/2020 Echo: EF>55%. Mod-sev paradoxical low-flow low-gradient AS; d. 08/2021 Echo: EF 35-40%,  severe AS, triv AI, mild MR.   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 Variable EF over time; b. 08/2017 Echo: EF 40%; b. 03/2020 Echo: EF 55-60%; c. 05/2020 Echo: EF 45-50%; d. 07/2020 Echo: EF 50-55%; e. 12/2020 Echo: EF>55%; f. 08/2021 Echo: EF 35-40%.   Chronic heart failure with preserved ejection fraction (HFpEF) (HCC)    a. Variable EF over time; b. 08/2017 Echo: EF 40%; b. 03/2020 Echo: EF 55-60%; c. 05/2020 Echo: EF 45-50%; d. 07/2020 Echo: EF 50-55%; e. 12/2020 Echo: EF>55%; f. 08/2021 Echo: EF 35-40%, no RV, mildly dil LA, mild MR, triv AI, severe AS.   CKD (chronic kidney disease) stage 3, GFR 30-59 ml/min (HCC)    Depression    Diabetes mellitus (HCC)    Dizziness    Fatty liver    Frequent falls    GERD (gastroesophageal reflux disease)    Gout    Heart murmur    History of blood transfusion    History of bone marrow transplant (HCC)    History of uterine fibroid    Hx of cardiac catheterization    a. 01/2016 Cath [facility] - after abnl nuc): Nl cors.   Hypertension    Hypomagnesemia    IDA (iron deficiency anemia)    Multiple myeloma (HCC)    NSTEMI (non-ST elevated myocardial infarction) (HCC) 08/21/2021   Personal history of chemotherapy    Pleurisy 04/09/2022   PSVT (paroxysmal supraventricular tachycardia)    a. S/p RFCA [facility]   PVC's (premature ventricular contractions)    a. Well-managed w/ bisoprolol in outpt setting.   Renal cyst     Past Surgical History:  Procedure Laterality Date   ABDOMINAL HYSTERECTOMY     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   CARDIAC ELECTROPHYSIOLOGY MAPPING AND ABLATION     CARPAL TUNNEL RELEASE Bilateral    CHOLECYSTECTOMY  2008   COLONOSCOPY WITH PROPOFOL N/A 05/07/2017   Procedure: COLONOSCOPY WITH PROPOFOL;  Surgeon: Wyline Mood, MD;  Location[facility] ENDOSCOPY;  Service: Gastroenterology;  Laterality:  N/A;   COLONOSCOPY WITH PROPOFOL N/A 12/19/2020   Procedure: COLONOSCOPY WITH PROPOFOL;  Surgeon: Wyline Mood, MD;  Location: [facility] ENDOSCOPY;  Service: Gastroenterology;  Laterality: N/A;   ESOPHAGOGASTRODUODENOSCOPY (EGD) WITH PROPOFOL N/A 05/07/2017   Procedure: ESOPHAGOGASTRODUODENOSCOPY (EGD) WITH PROPOFOL;  Surgeon: Wyline Mood, MD;  Location: [facility] ENDOSCOPY;  Service: Gastroenterology;  Laterality: N/A;   FOOT SURGERY Bilateral    INCONTINENCE SURGERY  2009   INTERSTIM IMPLANT PLACEMENT     other     over active bladder   OTHER SURGICAL HISTORY     bladder stimulator    PARTIAL HYSTERECTOMY  03/1996   fibroids   PORTA CATH INSERTION  N/A 03/10/2019   Procedure: PORTA CATH INSERTION;  Surgeon: Annice Needy, MD;  Location: [facility] INVASIVE CV LAB;  Service: Cardiovascular;  Laterality: N/A;   TONSILLECTOMY  2007    Family History  Problem Relation Age of Onset   Colon cancer Father    Renal Disease Father    Diabetes Mellitus II Father    Melanoma Paternal Grandmother    Breast cancer Maternal Aunt 67   Anemia Mother    Heart disease Mother    Heart failure Mother    Renal Disease Mother    Congestive Heart Failure Mother    Heart disease Maternal Uncle    Throat cancer Maternal Uncle    Lung cancer Maternal Uncle    Liver disease Maternal Uncle    Heart failure Maternal Uncle    Hearing loss Son 69       Suicide     Social History:  reports that she quit smoking about 31 years ago. Her smoking use included cigarettes. She started smoking about 51 years ago. She has a 20 pack-year smoking history. She has never used smokeless tobacco. She reports current alcohol use of about 2.0 standard drinks of alcohol per week. She reports that she does not use drugs.  She is on disability. She notes exposure to perchloroethylene [facility]).   Allergies:  Allergies  Allergen Reactions   Oxycodone-Acetaminophen Anaphylaxis    Swelling and rash   Celebrex [Celecoxib] Diarrhea   Codeine     Plerixafor     In 2016 during ASCT collection patient developed fever to 103.83F and required hospitalization   Benadryl [Diphenhydramine] Palpitations   Morphine Itching and Rash   Ondansetron Diarrhea   Tylenol [Acetaminophen] Itching and Rash    Current Medications: Current Outpatient Medications  Medication Sig Dispense Refill   allopurinol (ZYLOPRIM) 100 MG tablet TAKE 1 TABLET(100 MG) BY MOUTH DAILY as needed 90 tablet 1   amiodarone (PACERONE) 200 MG tablet Take 200 mg by mouth daily.     atorvastatin (LIPITOR) 10 MG tablet Take 0.5 tablets (5 mg total) by mouth daily. 45 tablet 1   bisoprolol (ZEBETA) 10 MG tablet Take 1 tablet (10 mg total) by mouth daily. 90 tablet 1   calcium carbonate (OS-CAL) 1250 (500 Ca) MG chewable tablet Chew 2 tablets by mouth daily.     diazepam (VALIUM) 5 MG tablet Take 0.5-1 tablets (2.5-5 mg total) by mouth at bedtime as needed (insomnia). 30 tablet 0   diclofenac sodium (VOLTAREN) 1 % GEL Apply 2 g topically 4 (four) times daily. 100 g 1   furosemide (LASIX) 40 MG tablet Take 0.5 tablets (20 mg total) by mouth daily. Home dose.     glipiZIDE (GLUCOTROL XL) 2.5 MG 24 hr tablet Take 1 tablet (2.5 mg total) by mouth daily with breakfast. 90 tablet 1   glucose blood test strip      lidocaine (XYLOCAINE) 2 % solution Use as directed 15 mLs in the mouth or throat every 3 (three) hours as needed for mouth pain (swish and spit). 100 mL 0   losartan (COZAAR) 25 MG tablet Take 1 tablet by mouth daily.     magnesium chloride (SLOW-MAG) 64 MG TBEC SR tablet Take 1 tablet (64 mg total) by mouth daily. 60 tablet 1   Multiple Vitamin (MULTIVITAMIN) tablet Take 1 tablet by mouth daily.     omeprazole (PRILOSEC) 40 MG capsule TAKE 1 CAPSULE IN THE MORNING AND TAKE 1 CAPSULE AT BEDTIME 180 capsule 1  pentoxifylline (TRENTAL) 400 MG CR tablet Take 400 mg by mouth 3 (three) times daily with meals.     potassium chloride SA (KLOR-CON M) 20 MEQ tablet Take 1 tablet (20  mEq total) by mouth daily. 7 tablet 0   promethazine (PHENERGAN) 25 MG tablet Take 1 tablet (25 mg total) by mouth every 6 (six) hours as needed for nausea or vomiting. 90 tablet 1   spironolactone (ALDACTONE) 25 MG tablet Take by mouth.     tiZANidine (ZANAFLEX) 4 MG tablet TAKE 1 TABLET(4 MG) BY MOUTH EVERY 6 HOURS AS NEEDED FOR MUSCLE SPASMS 90 tablet 0   traMADol (ULTRAM) 50 MG tablet      traZODone (DESYREL) 100 MG tablet TAKE 1 TABLET AT BEDTIME 90 tablet 3   vitamin E 45 MG (100 UNITS) capsule Take by mouth daily.     diphenoxylate-atropine (LOMOTIL) 2.5-0.025 MG tablet Take 2 tablets by mouth 4 (four) times daily as needed for diarrhea or loose stools. (Patient not taking: Reported on 02/27/2023) 180 tablet 3   empagliflozin (JARDIANCE) 10 MG TABS tablet Take 1 tablet by mouth daily. (Patient not taking: Reported on 03/11/2023)     ENTRESTO 24-26 MG Take 1 tablet by mouth 2 (two) times daily. (Patient not taking: Reported on 02/24/2023)     venlafaxine (EFFEXOR) 75 MG tablet Take 75 mg by mouth daily. (Patient not taking: Reported on 03/11/2023)     No current facility-administered medications for this visit.   Facility-Administered Medications Ordered in Other Visits  Medication Dose Route Frequency Provider Last Rate Last Admin   heparin lock flush 100 unit/mL  500 Units Intracatheter Once PRN Rickard Patience, MD       magnesium sulfate IVPB 2 g 50 mL  2 g Intravenous Once Rickard Patience, MD 50 mL/hr at 03/13/23 0932 2 g at 03/13/23 0932   magnesium sulfate IVPB 4 g 100 mL  4 g Intravenous Once Rickard Patience, MD       sodium chloride flush (NS) 0.9 % injection 10 mL  10 mL Intravenous PRN Rickard Patience, MD   10 mL at 04/02/21 1610    Review of Systems  Constitutional:  Negative for chills, fever, malaise/fatigue and weight loss.  HENT:  Negative for sore throat.   Eyes:  Negative for redness.  Respiratory:  Negative for cough, shortness of breath and wheezing.   Cardiovascular:  Negative for chest pain,  palpitations and leg swelling.  Gastrointestinal:  Positive for diarrhea. Negative for abdominal pain, blood in stool, nausea and vomiting.  Genitourinary:  Negative for dysuria.  Musculoskeletal:  Positive for back pain and joint pain. Negative for myalgias.  Skin:  Negative for rash.  Neurological:  Positive for tingling and sensory change. Negative for dizziness and tremors.  Endo/Heme/Allergies:  Does not bruise/bleed easily.  Psychiatric/Behavioral:  Negative for hallucinations.     Performance status (ECOG): 1  Vitals Blood pressure (!) 136/98, pulse 77, temperature (!) 96 F (35.6 C), temperature source Tympanic, resp. rate 18, weight 160 lb 14.4 oz (73 kg), SpO2 99%.  Physical Exam Constitutional:      General: She is not in acute distress.    Appearance: She is not diaphoretic.  HENT:     Head: Normocephalic and atraumatic.  Eyes:     General: No scleral icterus.    Pupils: Pupils are equal, round, and reactive to light.  Cardiovascular:     Rate and Rhythm: Normal rate.     Heart sounds: Murmur heard.  Pulmonary:     Effort: Pulmonary effort is normal. No respiratory distress.     Breath sounds: No rales.  Chest:     Chest wall: No tenderness.  Abdominal:     General: There is no distension.  Musculoskeletal:        General: Normal range of motion.     Cervical back: Normal range of motion and neck supple.  Skin:    General: Skin is warm and dry.     Findings: No erythema.     Comments: Lower extremity cat bite cellulitis has improved.   Neurological:     Mental Status: She is alert and oriented to person, place, and time.     Cranial Nerves: No cranial nerve deficit.     Motor: No abnormal muscle tone.     Coordination: Coordination normal.  Psychiatric:        Mood and Affect: Mood and affect normal.      Laboratory data    Latest Ref Rng & Units 03/13/2023    8:23 AM 02/24/2023    9:34 AM 01/13/2023    9:38 AM  CBC  WBC 4.0 - 10.5 K/uL 6.0  7.3  7.7    Hemoglobin 12.0 - 15.0 g/dL 64.3  32.9  51.8   Hematocrit 36.0 - 46.0 % 33.6  37.0  36.8   Platelets 150 - 400 K/uL 105  150  145       Latest Ref Rng & Units 03/13/2023    8:23 AM 02/27/2023    8:33 AM 02/24/2023    9:34 AM  CMP  Glucose 70 - 99 mg/dL 841  660  630   BUN 8 - 23 mg/dL 18  25  26    Creatinine 0.44 - 1.00 mg/dL 1.60  1.09  3.23   Sodium 135 - 145 mmol/L 138  136  137   Potassium 3.5 - 5.1 mmol/L 4.1  4.4  3.9   Chloride 98 - 111 mmol/L 105  108  106   CO2 22 - 32 mmol/L 26  22  21    Calcium 8.9 - 10.3 mg/dL 8.9  9.8  8.7   Total Protein 6.5 - 8.1 g/dL 7.3  7.2  7.5   Total Bilirubin 0.3 - 1.2 mg/dL 0.4  0.2  0.4   Alkaline Phos 38 - 126 U/L 99  78  66   AST 15 - 41 U/L 26  24  30    ALT 0 - 44 U/L 29  27  28

## 2023-03-13 NOTE — Assessment & Plan Note (Addendum)
B12 level normal.  continue B12 injections Q 6 weeks.

## 2023-03-13 NOTE — Assessment & Plan Note (Signed)
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   Previously on Lyrica and nortriptyline-not effective Neurology recommend Tramadol and may consider higher dose of  Nortriptyline in the future. Follows up with neurology. 

## 2023-03-13 NOTE — Assessment & Plan Note (Signed)
# 

## 2023-03-13 NOTE — Assessment & Plan Note (Addendum)
Recommend patient to get dental evaluation.  Obtain CT maxillofacial wo contrast.

## 2023-03-13 NOTE — Assessment & Plan Note (Signed)
Lab Results  Component Value Date   HGB 11.1 (L) 03/13/2023   TIBC 375 12/02/2022   IRONPCTSAT 18 12/02/2022   FERRITIN 26 12/02/2022    S/p Venofer treatments. Hb has improved. Observation.

## 2023-03-19 ENCOUNTER — Other Ambulatory Visit: Payer: Self-pay | Admitting: Internal Medicine

## 2023-03-19 DIAGNOSIS — K219 Gastro-esophageal reflux disease without esophagitis: Secondary | ICD-10-CM

## 2023-03-20 ENCOUNTER — Inpatient Hospital Stay: Payer: Medicare PPO

## 2023-03-20 VITALS — BP 148/80 | HR 58 | Temp 96.2°F | Resp 20

## 2023-03-20 DIAGNOSIS — E538 Deficiency of other specified B group vitamins: Secondary | ICD-10-CM | POA: Diagnosis not present

## 2023-03-20 DIAGNOSIS — T451X5A Adverse effect of antineoplastic and immunosuppressive drugs, initial encounter: Secondary | ICD-10-CM | POA: Diagnosis not present

## 2023-03-20 DIAGNOSIS — D500 Iron deficiency anemia secondary to blood loss (chronic): Secondary | ICD-10-CM | POA: Diagnosis not present

## 2023-03-20 DIAGNOSIS — G620 Drug-induced polyneuropathy: Secondary | ICD-10-CM | POA: Diagnosis not present

## 2023-03-20 DIAGNOSIS — C9001 Multiple myeloma in remission: Secondary | ICD-10-CM | POA: Diagnosis not present

## 2023-03-20 DIAGNOSIS — R6884 Jaw pain: Secondary | ICD-10-CM | POA: Diagnosis not present

## 2023-03-20 DIAGNOSIS — I5032 Chronic diastolic (congestive) heart failure: Secondary | ICD-10-CM | POA: Diagnosis not present

## 2023-03-20 DIAGNOSIS — K521 Toxic gastroenteritis and colitis: Secondary | ICD-10-CM | POA: Diagnosis not present

## 2023-03-20 LAB — MAGNESIUM: Magnesium: 1.1 mg/dL — ABNORMAL LOW (ref 1.7–2.4)

## 2023-03-20 MED ORDER — SODIUM CHLORIDE 0.9 % IV SOLN
Freq: Once | INTRAVENOUS | Status: AC
  Filled 2023-03-20: qty 250

## 2023-03-20 MED ORDER — SODIUM CHLORIDE 0.9% FLUSH
10.0000 mL | Freq: Once | INTRAVENOUS | Status: AC | PRN
  Administered 2023-03-20: 10 mL
  Filled 2023-03-20: qty 10

## 2023-03-20 MED ORDER — MAGNESIUM SULFATE 4 GM/100ML IV SOLN
4.0000 g | Freq: Once | INTRAVENOUS | Status: AC
  Administered 2023-03-20: 4 g via INTRAVENOUS
  Filled 2023-03-20: qty 100

## 2023-03-20 MED ORDER — MAGNESIUM SULFATE 2 GM/50ML IV SOLN
2.0000 g | Freq: Once | INTRAVENOUS | Status: AC
  Administered 2023-03-20: 2 g via INTRAVENOUS
  Filled 2023-03-20: qty 50

## 2023-03-20 MED ORDER — HEPARIN SOD (PORK) LOCK FLUSH 100 UNIT/ML IV SOLN
500.0000 [IU] | Freq: Once | INTRAVENOUS | Status: AC | PRN
  Administered 2023-03-20: 500 [IU]
  Filled 2023-03-20: qty 5

## 2023-03-20 MED ORDER — SODIUM CHLORIDE 0.9 % IV SOLN
25.0000 mg | Freq: Once | INTRAVENOUS | Status: AC
  Administered 2023-03-20: 25 mg via INTRAVENOUS
  Filled 2023-03-20: qty 1

## 2023-03-27 ENCOUNTER — Inpatient Hospital Stay: Payer: Medicare PPO

## 2023-03-27 VITALS — BP 144/87 | HR 81 | Temp 96.2°F | Resp 20

## 2023-03-27 DIAGNOSIS — T451X5A Adverse effect of antineoplastic and immunosuppressive drugs, initial encounter: Secondary | ICD-10-CM | POA: Diagnosis not present

## 2023-03-27 DIAGNOSIS — K521 Toxic gastroenteritis and colitis: Secondary | ICD-10-CM | POA: Diagnosis not present

## 2023-03-27 DIAGNOSIS — E538 Deficiency of other specified B group vitamins: Secondary | ICD-10-CM | POA: Diagnosis not present

## 2023-03-27 DIAGNOSIS — C9001 Multiple myeloma in remission: Secondary | ICD-10-CM

## 2023-03-27 DIAGNOSIS — I5032 Chronic diastolic (congestive) heart failure: Secondary | ICD-10-CM | POA: Diagnosis not present

## 2023-03-27 DIAGNOSIS — D500 Iron deficiency anemia secondary to blood loss (chronic): Secondary | ICD-10-CM | POA: Diagnosis not present

## 2023-03-27 DIAGNOSIS — G620 Drug-induced polyneuropathy: Secondary | ICD-10-CM | POA: Diagnosis not present

## 2023-03-27 DIAGNOSIS — R6884 Jaw pain: Secondary | ICD-10-CM | POA: Diagnosis not present

## 2023-03-27 LAB — MAGNESIUM: Magnesium: 1.4 mg/dL — ABNORMAL LOW (ref 1.7–2.4)

## 2023-03-27 MED ORDER — SODIUM CHLORIDE 0.9 % IV SOLN
25.0000 mg | Freq: Once | INTRAVENOUS | Status: AC
  Administered 2023-03-27: 25 mg via INTRAVENOUS
  Filled 2023-03-27: qty 1

## 2023-03-27 MED ORDER — HEPARIN SOD (PORK) LOCK FLUSH 100 UNIT/ML IV SOLN
500.0000 [IU] | Freq: Once | INTRAVENOUS | Status: AC | PRN
  Administered 2023-03-27: 500 [IU]
  Filled 2023-03-27: qty 5

## 2023-03-27 MED ORDER — MAGNESIUM SULFATE 2 GM/50ML IV SOLN
2.0000 g | Freq: Once | INTRAVENOUS | Status: AC
  Administered 2023-03-27: 2 g via INTRAVENOUS
  Filled 2023-03-27: qty 50

## 2023-03-27 MED ORDER — SODIUM CHLORIDE 0.9 % IV SOLN
Freq: Once | INTRAVENOUS | Status: AC
  Filled 2023-03-27: qty 250

## 2023-03-27 MED ORDER — SODIUM CHLORIDE 0.9% FLUSH
10.0000 mL | Freq: Once | INTRAVENOUS | Status: AC | PRN
  Administered 2023-03-27: 10 mL
  Filled 2023-03-27: qty 10

## 2023-04-03 ENCOUNTER — Inpatient Hospital Stay: Payer: Medicare PPO

## 2023-04-03 ENCOUNTER — Encounter: Payer: Self-pay | Admitting: Internal Medicine

## 2023-04-03 ENCOUNTER — Inpatient Hospital Stay: Payer: Medicare PPO | Attending: Oncology

## 2023-04-03 VITALS — BP 144/71 | HR 58 | Temp 96.1°F | Resp 20

## 2023-04-03 DIAGNOSIS — Z832 Family history of diseases of the blood and blood-forming organs and certain disorders involving the immune mechanism: Secondary | ICD-10-CM | POA: Insufficient documentation

## 2023-04-03 DIAGNOSIS — I130 Hypertensive heart and chronic kidney disease with heart failure and stage 1 through stage 4 chronic kidney disease, or unspecified chronic kidney disease: Secondary | ICD-10-CM | POA: Diagnosis not present

## 2023-04-03 DIAGNOSIS — Z886 Allergy status to analgesic agent status: Secondary | ICD-10-CM | POA: Diagnosis not present

## 2023-04-03 DIAGNOSIS — D631 Anemia in chronic kidney disease: Secondary | ICD-10-CM | POA: Insufficient documentation

## 2023-04-03 DIAGNOSIS — K760 Fatty (change of) liver, not elsewhere classified: Secondary | ICD-10-CM | POA: Diagnosis not present

## 2023-04-03 DIAGNOSIS — Z888 Allergy status to other drugs, medicaments and biological substances status: Secondary | ICD-10-CM | POA: Insufficient documentation

## 2023-04-03 DIAGNOSIS — F32A Depression, unspecified: Secondary | ICD-10-CM | POA: Diagnosis not present

## 2023-04-03 DIAGNOSIS — Z87891 Personal history of nicotine dependence: Secondary | ICD-10-CM | POA: Insufficient documentation

## 2023-04-03 DIAGNOSIS — N183 Chronic kidney disease, stage 3 unspecified: Secondary | ICD-10-CM | POA: Insufficient documentation

## 2023-04-03 DIAGNOSIS — E538 Deficiency of other specified B group vitamins: Secondary | ICD-10-CM | POA: Diagnosis not present

## 2023-04-03 DIAGNOSIS — Z801 Family history of malignant neoplasm of trachea, bronchus and lung: Secondary | ICD-10-CM | POA: Insufficient documentation

## 2023-04-03 DIAGNOSIS — G620 Drug-induced polyneuropathy: Secondary | ICD-10-CM | POA: Diagnosis not present

## 2023-04-03 DIAGNOSIS — D500 Iron deficiency anemia secondary to blood loss (chronic): Secondary | ICD-10-CM | POA: Diagnosis not present

## 2023-04-03 DIAGNOSIS — I350 Nonrheumatic aortic (valve) stenosis: Secondary | ICD-10-CM | POA: Diagnosis not present

## 2023-04-03 DIAGNOSIS — R6884 Jaw pain: Secondary | ICD-10-CM | POA: Diagnosis not present

## 2023-04-03 DIAGNOSIS — I429 Cardiomyopathy, unspecified: Secondary | ICD-10-CM | POA: Diagnosis not present

## 2023-04-03 DIAGNOSIS — T451X5A Adverse effect of antineoplastic and immunosuppressive drugs, initial encounter: Secondary | ICD-10-CM | POA: Diagnosis not present

## 2023-04-03 DIAGNOSIS — K219 Gastro-esophageal reflux disease without esophagitis: Secondary | ICD-10-CM | POA: Diagnosis not present

## 2023-04-03 DIAGNOSIS — I5032 Chronic diastolic (congestive) heart failure: Secondary | ICD-10-CM | POA: Insufficient documentation

## 2023-04-03 DIAGNOSIS — Z90710 Acquired absence of both cervix and uterus: Secondary | ICD-10-CM | POA: Insufficient documentation

## 2023-04-03 DIAGNOSIS — Z803 Family history of malignant neoplasm of breast: Secondary | ICD-10-CM | POA: Insufficient documentation

## 2023-04-03 DIAGNOSIS — Z800 Family history of malignant neoplasm of digestive organs: Secondary | ICD-10-CM | POA: Insufficient documentation

## 2023-04-03 DIAGNOSIS — Z9484 Stem cells transplant status: Secondary | ICD-10-CM | POA: Insufficient documentation

## 2023-04-03 DIAGNOSIS — E1122 Type 2 diabetes mellitus with diabetic chronic kidney disease: Secondary | ICD-10-CM | POA: Insufficient documentation

## 2023-04-03 DIAGNOSIS — Z8379 Family history of other diseases of the digestive system: Secondary | ICD-10-CM | POA: Insufficient documentation

## 2023-04-03 DIAGNOSIS — Z833 Family history of diabetes mellitus: Secondary | ICD-10-CM | POA: Insufficient documentation

## 2023-04-03 DIAGNOSIS — Z818 Family history of other mental and behavioral disorders: Secondary | ICD-10-CM | POA: Insufficient documentation

## 2023-04-03 DIAGNOSIS — Z9049 Acquired absence of other specified parts of digestive tract: Secondary | ICD-10-CM | POA: Insufficient documentation

## 2023-04-03 DIAGNOSIS — F419 Anxiety disorder, unspecified: Secondary | ICD-10-CM | POA: Diagnosis not present

## 2023-04-03 DIAGNOSIS — Z841 Family history of disorders of kidney and ureter: Secondary | ICD-10-CM | POA: Insufficient documentation

## 2023-04-03 DIAGNOSIS — Z885 Allergy status to narcotic agent status: Secondary | ICD-10-CM | POA: Insufficient documentation

## 2023-04-03 DIAGNOSIS — I252 Old myocardial infarction: Secondary | ICD-10-CM | POA: Insufficient documentation

## 2023-04-03 DIAGNOSIS — C9001 Multiple myeloma in remission: Secondary | ICD-10-CM | POA: Diagnosis not present

## 2023-04-03 DIAGNOSIS — Z79899 Other long term (current) drug therapy: Secondary | ICD-10-CM | POA: Diagnosis not present

## 2023-04-03 DIAGNOSIS — Z9221 Personal history of antineoplastic chemotherapy: Secondary | ICD-10-CM | POA: Insufficient documentation

## 2023-04-03 DIAGNOSIS — Z808 Family history of malignant neoplasm of other organs or systems: Secondary | ICD-10-CM | POA: Insufficient documentation

## 2023-04-03 DIAGNOSIS — Z822 Family history of deafness and hearing loss: Secondary | ICD-10-CM | POA: Insufficient documentation

## 2023-04-03 DIAGNOSIS — Z8249 Family history of ischemic heart disease and other diseases of the circulatory system: Secondary | ICD-10-CM | POA: Insufficient documentation

## 2023-04-03 LAB — MAGNESIUM: Magnesium: 1.2 mg/dL — ABNORMAL LOW (ref 1.7–2.4)

## 2023-04-03 MED ORDER — MAGNESIUM SULFATE 4 GM/100ML IV SOLN
4.0000 g | Freq: Once | INTRAVENOUS | Status: AC
  Administered 2023-04-03: 4 g via INTRAVENOUS

## 2023-04-03 MED ORDER — SODIUM CHLORIDE 0.9 % IV SOLN
Freq: Once | INTRAVENOUS | Status: AC
  Filled 2023-04-03: qty 250

## 2023-04-03 MED ORDER — SODIUM CHLORIDE 0.9% FLUSH
10.0000 mL | Freq: Once | INTRAVENOUS | Status: AC | PRN
  Administered 2023-04-03: 10 mL
  Filled 2023-04-03: qty 10

## 2023-04-03 MED ORDER — HEPARIN SOD (PORK) LOCK FLUSH 100 UNIT/ML IV SOLN
500.0000 [IU] | Freq: Once | INTRAVENOUS | Status: AC | PRN
  Administered 2023-04-03: 500 [IU]
  Filled 2023-04-03: qty 5

## 2023-04-03 MED ORDER — SODIUM CHLORIDE 0.9 % IV SOLN
25.0000 mg | Freq: Once | INTRAVENOUS | Status: AC
  Administered 2023-04-03: 25 mg via INTRAVENOUS
  Filled 2023-04-03: qty 1

## 2023-04-03 MED ORDER — MAGNESIUM SULFATE 2 GM/50ML IV SOLN
2.0000 g | Freq: Once | INTRAVENOUS | Status: AC
  Administered 2023-04-03: 2 g via INTRAVENOUS
  Filled 2023-04-03: qty 50

## 2023-04-04 NOTE — Telephone Encounter (Signed)
Please review.  KP

## 2023-04-07 ENCOUNTER — Other Ambulatory Visit: Payer: Self-pay | Admitting: Internal Medicine

## 2023-04-07 DIAGNOSIS — F324 Major depressive disorder, single episode, in partial remission: Secondary | ICD-10-CM

## 2023-04-07 MED ORDER — FLUOXETINE HCL 20 MG PO CAPS
20.0000 mg | ORAL_CAPSULE | Freq: Every day | ORAL | 0 refills | Status: DC
Start: 2023-04-07 — End: 2023-05-05

## 2023-04-10 ENCOUNTER — Inpatient Hospital Stay: Payer: Medicare PPO

## 2023-04-10 ENCOUNTER — Other Ambulatory Visit: Payer: Self-pay | Admitting: Oncology

## 2023-04-10 VITALS — BP 138/77 | HR 54 | Temp 96.6°F | Resp 20

## 2023-04-10 DIAGNOSIS — E538 Deficiency of other specified B group vitamins: Secondary | ICD-10-CM | POA: Diagnosis not present

## 2023-04-10 DIAGNOSIS — Z9484 Stem cells transplant status: Secondary | ICD-10-CM | POA: Diagnosis not present

## 2023-04-10 DIAGNOSIS — D500 Iron deficiency anemia secondary to blood loss (chronic): Secondary | ICD-10-CM | POA: Diagnosis not present

## 2023-04-10 DIAGNOSIS — C9001 Multiple myeloma in remission: Secondary | ICD-10-CM

## 2023-04-10 DIAGNOSIS — T451X5A Adverse effect of antineoplastic and immunosuppressive drugs, initial encounter: Secondary | ICD-10-CM | POA: Diagnosis not present

## 2023-04-10 DIAGNOSIS — R6884 Jaw pain: Secondary | ICD-10-CM | POA: Diagnosis not present

## 2023-04-10 DIAGNOSIS — G620 Drug-induced polyneuropathy: Secondary | ICD-10-CM | POA: Diagnosis not present

## 2023-04-10 DIAGNOSIS — I350 Nonrheumatic aortic (valve) stenosis: Secondary | ICD-10-CM | POA: Diagnosis not present

## 2023-04-10 LAB — MAGNESIUM: Magnesium: 1.1 mg/dL — ABNORMAL LOW (ref 1.7–2.4)

## 2023-04-10 MED ORDER — MAGNESIUM SULFATE 4 GM/100ML IV SOLN
4.0000 g | Freq: Once | INTRAVENOUS | Status: DC
  Filled 2023-04-10: qty 100

## 2023-04-10 MED ORDER — MAGNESIUM SULFATE 2 GM/50ML IV SOLN
2.0000 g | Freq: Once | INTRAVENOUS | Status: AC
  Administered 2023-04-10: 2 g via INTRAVENOUS
  Filled 2023-04-10: qty 50

## 2023-04-10 MED ORDER — SODIUM CHLORIDE 0.9 % IV SOLN
INTRAVENOUS | Status: DC
  Filled 2023-04-10: qty 250

## 2023-04-10 MED ORDER — HEPARIN SOD (PORK) LOCK FLUSH 100 UNIT/ML IV SOLN
500.0000 [IU] | Freq: Once | INTRAVENOUS | Status: AC | PRN
  Administered 2023-04-10: 500 [IU]
  Filled 2023-04-10: qty 5

## 2023-04-10 MED ORDER — SODIUM CHLORIDE 0.9% FLUSH
10.0000 mL | Freq: Once | INTRAVENOUS | Status: DC | PRN
  Filled 2023-04-10: qty 10

## 2023-04-10 MED ORDER — SODIUM CHLORIDE 0.9 % IV SOLN
25.0000 mg | Freq: Once | INTRAVENOUS | Status: AC
  Administered 2023-04-10: 25 mg via INTRAVENOUS
  Filled 2023-04-10: qty 1

## 2023-04-14 ENCOUNTER — Encounter: Payer: Self-pay | Admitting: Gastroenterology

## 2023-04-14 ENCOUNTER — Ambulatory Visit (INDEPENDENT_AMBULATORY_CARE_PROVIDER_SITE_OTHER): Payer: Medicare PPO | Admitting: Gastroenterology

## 2023-04-14 VITALS — BP 148/70 | HR 66 | Temp 97.7°F | Wt 157.8 lb

## 2023-04-14 DIAGNOSIS — R197 Diarrhea, unspecified: Secondary | ICD-10-CM | POA: Diagnosis not present

## 2023-04-14 DIAGNOSIS — I350 Nonrheumatic aortic (valve) stenosis: Secondary | ICD-10-CM | POA: Diagnosis not present

## 2023-04-14 DIAGNOSIS — A498 Other bacterial infections of unspecified site: Secondary | ICD-10-CM

## 2023-04-14 DIAGNOSIS — A045 Campylobacter enteritis: Secondary | ICD-10-CM

## 2023-04-14 DIAGNOSIS — K529 Noninfective gastroenteritis and colitis, unspecified: Secondary | ICD-10-CM

## 2023-04-14 NOTE — Progress Notes (Signed)
Wyline Mood MD, MRCP(U.K) [address]  [address]  [place], [place] [address]  Main: [phone]  Fax: [phone]   Primary Care Physician: [name], MD  Primary Gastroenterologist:  Dr. Wyline Mood   No chief complaint on file.   HPI: Sarah [name] is a 67 y.o. female Summary of history :   She was last seen in my office back in 2022 I have seen her back in 2018 for history of diarrhea alternating with constipation. She has a history of multiple myeloma s/p autologous bone marrow transplantation in 2021, constipation, has been using fentanyl patch for chronic pain.  Bowel movements are preceded by abdominal distention and bloating.  Has tried MiraLAX, fiber supplements docusate, senna which has not worked.  Denies use of any NSAIDs she is on metformin.  Complains of chronic nausea that began shortly after she eats.  She has a sensation of food sitting in her stomach for a long period of time.  We did initially try to put her on a high-fiber diet but it caused her to get significant diarrhea and it was stopped.   05/07/2017: EGD: Biopsies of stomach showed antral type mucosa with features of a healing mucosal injury.  I also performed a colonoscopy on the same day which was normal. Has had recurrent myeloma. 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  Has received chemotherapy.  She has severe aortic stenosis based on echo   05/20/2020.  Cardiology is following for possible TAVR.     11/16/2020: Hemoglobin 11.2 g platelet count 120 creatinine 1.14 iron studies show no abnormality 03/05/2021 CT abdomen and pelvis with contrast shows no acute abnormalities 04/16/2021: Hemoglobin 11.2 g, CMP creatinine 1.06 otherwise LFTs are normal    Interval history 11/16/2020-04/18/2023 02/18/2023 Campylobacter and enteropathogenic E. coli positive 03/13/2023 hemoglobin 11.1 g  She was tested and positive for above Campylobacter and E. coli in 02/18/2023.  States  she was prescribed some antibiotics for the same but did not take it [name] to drug interaction continues to have diarrhea going on for a few months takes ibuprofen and naproxen on a daily basis for back pain.  The reason she did not take the antibiotics was [name] tointeraction with her cardiac medications  Denies any use of artificial sugars and sweeteners   Current Outpatient Medications  Medication Sig Dispense Refill   allopurinol (ZYLOPRIM) 100 MG tablet TAKE 1 TABLET(100 MG) BY MOUTH DAILY as needed 90 tablet 1   amiodarone (PACERONE) 200 MG tablet Take 200 mg by mouth daily.     atorvastatin (LIPITOR) 10 MG tablet Take 0.5 tablets (5 mg total) by mouth daily. 45 tablet 1   bisoprolol (ZEBETA) 10 MG tablet Take 1 tablet (10 mg total) by mouth daily. 90 tablet 1   calcium carbonate (OS-CAL) 1250 (500 Ca) MG chewable tablet Chew 2 tablets by mouth daily.     diazepam (VALIUM) 5 MG tablet Take 0.5-1 tablets (2.5-5 mg total) by mouth at bedtime as needed (insomnia). 30 tablet 0   diclofenac sodium (VOLTAREN) 1 % GEL Apply 2 g topically 4 (four) times daily. 100 g 1   diphenoxylate-atropine (LOMOTIL) 2.5-0.025 MG tablet Take 2 tablets by mouth 4 (four) times daily as needed for diarrhea or loose stools. (Patient not taking: Reported on 02/27/2023) 180 tablet 3   empagliflozin (JARDIANCE) 10 MG TABS tablet Take 1 tablet by mouth daily. (Patient not taking: Reported on 03/11/2023)     ENTRESTO 24-26  MG Take 1 tablet by mouth 2 (two) times daily. (Patient not taking: Reported on 02/24/2023)     FLUoxetine (PROZAC) 20 MG capsule Take 1 capsule (20 mg total) by mouth daily. 30 capsule 0   furosemide (LASIX) 40 MG tablet Take 0.5 tablets (20 mg total) by mouth daily. Home dose.     glipiZIDE (GLUCOTROL XL) 2.5 MG 24 hr tablet Take 1 tablet (2.5 mg total) by mouth daily with breakfast. 90 tablet 1   glucose blood test strip      lidocaine (XYLOCAINE) 2 % solution Use as directed 15 mLs in the mouth or throat  every 3 (three) hours as needed for mouth pain (swish and spit). 100 mL 0   losartan (COZAAR) 25 MG tablet Take 1 tablet by mouth daily.     magnesium chloride (SLOW-MAG) 64 MG TBEC SR tablet Take 1 tablet (64 mg total) by mouth daily. 60 tablet 1   Multiple Vitamin (MULTIVITAMIN) tablet Take 1 tablet by mouth daily.     omeprazole (PRILOSEC) 40 MG capsule TAKE 1 CAPSULE IN THE MORNING AND TAKE 1 CAPSULE AT BEDTIME 180 capsule 3   pentoxifylline (TRENTAL) 400 MG CR tablet Take 400 mg by mouth 3 (three) times daily with meals.     potassium chloride SA (KLOR-CON M) 20 MEQ tablet Take 1 tablet (20 mEq total) by mouth daily. 7 tablet 0   promethazine (PHENERGAN) 25 MG tablet Take 1 tablet (25 mg total) by mouth every 6 (six) hours as needed for nausea or vomiting. 90 tablet 1   spironolactone (ALDACTONE) 25 MG tablet Take by mouth.     tiZANidine (ZANAFLEX) 4 MG tablet TAKE 1 TABLET(4 MG) BY MOUTH EVERY 6 HOURS AS NEEDED FOR MUSCLE SPASMS 90 tablet 0   traMADol (ULTRAM) 50 MG tablet      traZODone (DESYREL) 100 MG tablet TAKE 1 TABLET AT BEDTIME 90 tablet 3   vitamin E 45 MG (100 UNITS) capsule Take by mouth daily.     No current facility-administered medications for this visit.   Facility-Administered Medications Ordered in Other Visits  Medication Dose Route Frequency Provider Last Rate Last Admin   sodium chloride flush (NS) 0.9 % injection 10 mL  10 mL Intravenous PRN Rickard Patience, MD   10 mL at 04/02/21 0904    Allergies as of 04/14/2023 - Review Complete 03/13/2023  Allergen Reaction Noted   Oxycodone-acetaminophen Anaphylaxis 09/01/2011   Celebrex [celecoxib] Diarrhea 01/04/2019   Codeine  09/05/2017   Plerixafor  12/31/2019   Benadryl [diphenhydramine] Palpitations 04/02/2016   Morphine Itching and Rash 04/02/2016   Ondansetron Diarrhea 09/01/2011   Tylenol [acetaminophen] Itching and Rash 04/02/2016     ROS:  General: Negative for anorexia, weight loss, fever, chills, fatigue,  weakness. ENT: Negative for hoarseness, difficulty swallowing , nasal congestion. CV: Negative for chest pain, angina, palpitations, dyspnea on exertion, peripheral edema.  Respiratory: Negative for dyspnea at rest, dyspnea on exertion, cough, sputum, wheezing.  GI: See history of present illness. GU:  Negative for dysuria, hematuria, urinary incontinence, urinary frequency, nocturnal urination.  Endo: Negative for unusual weight change.    Physical Examination:   There were no vitals taken for this visit.  General: Well-nourished, well-developed in no acute distress.   Neuro: Alert and oriented x 3.  Grossly intact. Skin: Warm and dry, no jaundice.   Psych: Alert and cooperative, normal mood and affect.   Imaging Studies: No results found.  Assessment and Plan:   Sarah [name]  is a 67 y.o. y/o female  with a history of multiple myeloma status post bone marrow transplant.  History of severe aortic stenosis .previously  evaluated with upper endoscopy and colonoscopy if for epigastric pain back in 2018.  Previously had diarrhea secondary to consumption of artificial sugars and foods.  Presently has active diarrhea, could be related to NSAID use.  She recently had stool test done about 2 months back was positive for Campylobacter and E. coli but was not given any antibiotics because of concerns of drug drug interaction.  With severe aortic stenosis would like to avoid endoscopy unless absolutely needed.  Plan 1.  Recheck stool and if positive for bacteria we will discuss options to treat 2.  Stop all NSAID use 3.  At follow-up visit if the still has diarrhea and no infection seen commence on cholestyramine for bile salt mediated diarrhea   Dr Wyline Mood  MD,MRCP [facility]) Follow up in 8 weeks with Celso Amy

## 2023-04-14 NOTE — Addendum Note (Signed)
Addended by: Adela Ports on: 04/14/2023 02:19 PM   Modules accepted: Orders

## 2023-04-16 ENCOUNTER — Encounter: Payer: Self-pay | Admitting: Oncology

## 2023-04-16 DIAGNOSIS — K529 Noninfective gastroenteritis and colitis, unspecified: Secondary | ICD-10-CM | POA: Diagnosis not present

## 2023-04-17 ENCOUNTER — Encounter: Payer: Self-pay | Admitting: Podiatry

## 2023-04-17 ENCOUNTER — Inpatient Hospital Stay: Payer: Medicare PPO

## 2023-04-17 ENCOUNTER — Ambulatory Visit (INDEPENDENT_AMBULATORY_CARE_PROVIDER_SITE_OTHER): Payer: Medicare PPO | Admitting: Podiatry

## 2023-04-17 VITALS — BP 136/58 | HR 52 | Temp 96.8°F | Resp 20

## 2023-04-17 DIAGNOSIS — D500 Iron deficiency anemia secondary to blood loss (chronic): Secondary | ICD-10-CM | POA: Diagnosis not present

## 2023-04-17 DIAGNOSIS — B351 Tinea unguium: Secondary | ICD-10-CM

## 2023-04-17 DIAGNOSIS — C9001 Multiple myeloma in remission: Secondary | ICD-10-CM | POA: Diagnosis not present

## 2023-04-17 DIAGNOSIS — M79675 Pain in left toe(s): Secondary | ICD-10-CM

## 2023-04-17 DIAGNOSIS — M79674 Pain in right toe(s): Secondary | ICD-10-CM | POA: Diagnosis not present

## 2023-04-17 DIAGNOSIS — R6884 Jaw pain: Secondary | ICD-10-CM

## 2023-04-17 DIAGNOSIS — M9903 Segmental and somatic dysfunction of lumbar region: Secondary | ICD-10-CM | POA: Diagnosis not present

## 2023-04-17 DIAGNOSIS — E538 Deficiency of other specified B group vitamins: Secondary | ICD-10-CM | POA: Diagnosis not present

## 2023-04-17 DIAGNOSIS — Z9484 Stem cells transplant status: Secondary | ICD-10-CM | POA: Diagnosis not present

## 2023-04-17 DIAGNOSIS — I350 Nonrheumatic aortic (valve) stenosis: Secondary | ICD-10-CM | POA: Diagnosis not present

## 2023-04-17 DIAGNOSIS — G620 Drug-induced polyneuropathy: Secondary | ICD-10-CM | POA: Diagnosis not present

## 2023-04-17 DIAGNOSIS — M9901 Segmental and somatic dysfunction of cervical region: Secondary | ICD-10-CM | POA: Diagnosis not present

## 2023-04-17 DIAGNOSIS — M542 Cervicalgia: Secondary | ICD-10-CM | POA: Diagnosis not present

## 2023-04-17 DIAGNOSIS — M5416 Radiculopathy, lumbar region: Secondary | ICD-10-CM | POA: Diagnosis not present

## 2023-04-17 DIAGNOSIS — T451X5A Adverse effect of antineoplastic and immunosuppressive drugs, initial encounter: Secondary | ICD-10-CM | POA: Diagnosis not present

## 2023-04-17 LAB — MAGNESIUM: Magnesium: 1.3 mg/dL — ABNORMAL LOW (ref 1.7–2.4)

## 2023-04-17 MED ORDER — MAGNESIUM SULFATE 2 GM/50ML IV SOLN
2.0000 g | Freq: Once | INTRAVENOUS | Status: AC
  Administered 2023-04-17: 2 g via INTRAVENOUS
  Filled 2023-04-17: qty 50

## 2023-04-17 MED ORDER — SODIUM CHLORIDE 0.9 % IV SOLN
25.0000 mg | Freq: Once | INTRAVENOUS | Status: AC
  Administered 2023-04-17: 25 mg via INTRAVENOUS
  Filled 2023-04-17: qty 1

## 2023-04-17 MED ORDER — SODIUM CHLORIDE 0.9 % IV SOLN
INTRAVENOUS | Status: DC
  Filled 2023-04-17: qty 250

## 2023-04-17 MED ORDER — HEPARIN SOD (PORK) LOCK FLUSH 100 UNIT/ML IV SOLN
500.0000 [IU] | Freq: Once | INTRAVENOUS | Status: AC | PRN
  Administered 2023-04-17: 500 [IU]
  Filled 2023-04-17: qty 5

## 2023-04-17 MED ORDER — MAGNESIUM SULFATE 4 GM/100ML IV SOLN
4.0000 g | Freq: Once | INTRAVENOUS | Status: AC
  Administered 2023-04-17: 4 g via INTRAVENOUS
  Filled 2023-04-17: qty 100

## 2023-04-17 MED ORDER — SODIUM CHLORIDE 0.9% FLUSH
10.0000 mL | Freq: Once | INTRAVENOUS | Status: AC | PRN
  Administered 2023-04-17: 10 mL
  Filled 2023-04-17: qty 10

## 2023-04-19 DIAGNOSIS — M9903 Segmental and somatic dysfunction of lumbar region: Secondary | ICD-10-CM | POA: Diagnosis not present

## 2023-04-19 DIAGNOSIS — M9901 Segmental and somatic dysfunction of cervical region: Secondary | ICD-10-CM | POA: Diagnosis not present

## 2023-04-19 DIAGNOSIS — M5416 Radiculopathy, lumbar region: Secondary | ICD-10-CM | POA: Diagnosis not present

## 2023-04-19 DIAGNOSIS — M542 Cervicalgia: Secondary | ICD-10-CM | POA: Diagnosis not present

## 2023-04-20 LAB — GI PROFILE, STOOL, PCR

## 2023-04-20 LAB — C DIFFICILE, CYTOTOXIN B

## 2023-04-20 LAB — CALPROTECTIN, FECAL: Calprotectin, Fecal: 94 ug/g (ref 0–120)

## 2023-04-20 LAB — C DIFFICILE TOXINS A+B W/RFLX: C difficile Toxins A+B, EIA: NEGATIVE

## 2023-04-21 DIAGNOSIS — M62830 Muscle spasm of back: Secondary | ICD-10-CM | POA: Diagnosis not present

## 2023-04-21 DIAGNOSIS — M9901 Segmental and somatic dysfunction of cervical region: Secondary | ICD-10-CM | POA: Diagnosis not present

## 2023-04-21 DIAGNOSIS — M9902 Segmental and somatic dysfunction of thoracic region: Secondary | ICD-10-CM | POA: Diagnosis not present

## 2023-04-21 DIAGNOSIS — M5412 Radiculopathy, cervical region: Secondary | ICD-10-CM | POA: Diagnosis not present

## 2023-04-23 DIAGNOSIS — M5412 Radiculopathy, cervical region: Secondary | ICD-10-CM | POA: Diagnosis not present

## 2023-04-23 DIAGNOSIS — M62830 Muscle spasm of back: Secondary | ICD-10-CM | POA: Diagnosis not present

## 2023-04-23 DIAGNOSIS — M9901 Segmental and somatic dysfunction of cervical region: Secondary | ICD-10-CM | POA: Diagnosis not present

## 2023-04-23 DIAGNOSIS — M9902 Segmental and somatic dysfunction of thoracic region: Secondary | ICD-10-CM | POA: Diagnosis not present

## 2023-04-24 ENCOUNTER — Inpatient Hospital Stay: Payer: Medicare PPO

## 2023-04-24 ENCOUNTER — Encounter: Payer: Self-pay | Admitting: Oncology

## 2023-04-24 ENCOUNTER — Inpatient Hospital Stay (HOSPITAL_BASED_OUTPATIENT_CLINIC_OR_DEPARTMENT_OTHER): Payer: Medicare PPO | Admitting: Oncology

## 2023-04-24 ENCOUNTER — Other Ambulatory Visit: Payer: Medicare PPO

## 2023-04-24 VITALS — BP 116/51 | HR 57

## 2023-04-24 VITALS — BP 135/86 | HR 71 | Temp 97.9°F | Resp 18 | Wt 160.3 lb

## 2023-04-24 DIAGNOSIS — C9001 Multiple myeloma in remission: Secondary | ICD-10-CM

## 2023-04-24 DIAGNOSIS — E538 Deficiency of other specified B group vitamins: Secondary | ICD-10-CM | POA: Diagnosis not present

## 2023-04-24 DIAGNOSIS — G629 Polyneuropathy, unspecified: Secondary | ICD-10-CM | POA: Diagnosis not present

## 2023-04-24 DIAGNOSIS — Z9484 Stem cells transplant status: Secondary | ICD-10-CM | POA: Diagnosis not present

## 2023-04-24 DIAGNOSIS — D500 Iron deficiency anemia secondary to blood loss (chronic): Secondary | ICD-10-CM | POA: Diagnosis not present

## 2023-04-24 DIAGNOSIS — R6884 Jaw pain: Secondary | ICD-10-CM | POA: Diagnosis not present

## 2023-04-24 DIAGNOSIS — I350 Nonrheumatic aortic (valve) stenosis: Secondary | ICD-10-CM | POA: Diagnosis not present

## 2023-04-24 DIAGNOSIS — C9000 Multiple myeloma not having achieved remission: Secondary | ICD-10-CM

## 2023-04-24 DIAGNOSIS — T451X5A Adverse effect of antineoplastic and immunosuppressive drugs, initial encounter: Secondary | ICD-10-CM | POA: Diagnosis not present

## 2023-04-24 DIAGNOSIS — G620 Drug-induced polyneuropathy: Secondary | ICD-10-CM | POA: Diagnosis not present

## 2023-04-24 LAB — CBC WITH DIFFERENTIAL (CANCER CENTER ONLY)
Abs Immature Granulocytes: 0.01 10*3/uL (ref 0.00–0.07)
Basophils Absolute: 0 10*3/uL (ref 0.0–0.1)
Basophils Relative: 1 %
Eosinophils Absolute: 0.1 10*3/uL (ref 0.0–0.5)
Eosinophils Relative: 2 %
HCT: 33 % — ABNORMAL LOW (ref 36.0–46.0)
Hemoglobin: 11.2 g/dL — ABNORMAL LOW (ref 12.0–15.0)
Immature Granulocytes: 0 %
Lymphocytes Relative: 29 %
Lymphs Abs: 1.4 10*3/uL (ref 0.7–4.0)
MCH: 28.9 pg (ref 26.0–34.0)
MCHC: 33.9 g/dL (ref 30.0–36.0)
MCV: 85.3 fL (ref 80.0–100.0)
Monocytes Absolute: 0.4 10*3/uL (ref 0.1–1.0)
Monocytes Relative: 9 %
Neutro Abs: 2.9 10*3/uL (ref 1.7–7.7)
Neutrophils Relative %: 59 %
Platelet Count: 127 10*3/uL — ABNORMAL LOW (ref 150–400)
RBC: 3.87 MIL/uL (ref 3.87–5.11)
RDW: 13.7 % (ref 11.5–15.5)
WBC Count: 4.9 10*3/uL (ref 4.0–10.5)
nRBC: 0 % (ref 0.0–0.2)

## 2023-04-24 LAB — CMP (CANCER CENTER ONLY)
ALT: 28 U/L (ref 0–44)
AST: 28 U/L (ref 15–41)
Albumin: 4.3 g/dL (ref 3.5–5.0)
Alkaline Phosphatase: 75 U/L (ref 38–126)
Anion gap: 7 (ref 5–15)
BUN: 27 mg/dL — ABNORMAL HIGH (ref 8–23)
CO2: 22 mmol/L (ref 22–32)
Calcium: 9.3 mg/dL (ref 8.9–10.3)
Chloride: 108 mmol/L (ref 98–111)
Creatinine: 1.25 mg/dL — ABNORMAL HIGH (ref 0.44–1.00)
GFR, Estimated: 48 mL/min — ABNORMAL LOW (ref >60)
Glucose, Bld: 136 mg/dL — ABNORMAL HIGH (ref 70–99)
Potassium: 4.5 mmol/L (ref 3.5–5.1)
Sodium: 137 mmol/L (ref 135–145)
Total Bilirubin: 0.5 mg/dL (ref 0.3–1.2)
Total Protein: 7 g/dL (ref 6.5–8.1)

## 2023-04-24 LAB — MAGNESIUM: Magnesium: 1.2 mg/dL — ABNORMAL LOW (ref 1.7–2.4)

## 2023-04-24 MED ORDER — SODIUM CHLORIDE 0.9% FLUSH
10.0000 mL | Freq: Once | INTRAVENOUS | Status: AC
Start: 2023-04-24 — End: 2023-04-24
  Administered 2023-04-24: 10 mL via INTRAVENOUS
  Filled 2023-04-24: qty 10

## 2023-04-24 MED ORDER — SODIUM CHLORIDE 0.9 % IV SOLN
Freq: Once | INTRAVENOUS | Status: AC
Start: 2023-04-24 — End: 2023-04-24
  Filled 2023-04-24: qty 250

## 2023-04-24 MED ORDER — SODIUM CHLORIDE 0.9 % IV SOLN
25.0000 mg | Freq: Once | INTRAVENOUS | Status: AC
  Administered 2023-04-24: 25 mg via INTRAVENOUS
  Filled 2023-04-24: qty 1

## 2023-04-24 MED ORDER — MAGNESIUM SULFATE 4 GM/100ML IV SOLN
4.0000 g | Freq: Once | INTRAVENOUS | Status: AC
  Administered 2023-04-24: 4 g via INTRAVENOUS
  Filled 2023-04-24: qty 100

## 2023-04-24 MED ORDER — SODIUM CHLORIDE 0.9 % IV SOLN
6.0000 g | Freq: Once | INTRAVENOUS | Status: DC
Start: 2023-04-24 — End: 2023-04-24

## 2023-04-24 MED ORDER — HEPARIN SOD (PORK) LOCK FLUSH 100 UNIT/ML IV SOLN
500.0000 [IU] | Freq: Once | INTRAVENOUS | Status: AC
  Administered 2023-04-24: 500 [IU] via INTRAVENOUS
  Filled 2023-04-24: qty 5

## 2023-04-24 MED ORDER — MAGNESIUM SULFATE 2 GM/50ML IV SOLN
2.0000 g | Freq: Once | INTRAVENOUS | Status: AC
Start: 2023-04-24 — End: 2023-04-24
  Administered 2023-04-24: 2 g via INTRAVENOUS
  Filled 2023-04-24: qty 50

## 2023-04-24 MED ORDER — CYANOCOBALAMIN 1000 MCG/ML IJ SOLN
1000.0000 ug | Freq: Once | INTRAMUSCULAR | Status: AC
  Administered 2023-04-24: 1000 ug via INTRAMUSCULAR

## 2023-04-24 NOTE — Assessment & Plan Note (Signed)
Lab Results  Component Value Date   HGB 11.2 (L) 04/24/2023   TIBC 375 12/02/2022   IRONPCTSAT 18 12/02/2022   FERRITIN 26 12/02/2022    S/p Venofer treatments. Stable Observation.

## 2023-04-24 NOTE — Assessment & Plan Note (Signed)
B12 level normal.  continue B12 injections Q 6 weeks.

## 2023-04-24 NOTE — Assessment & Plan Note (Signed)
#  Chronic hypomagnesemia proceed with  IV magnesium today.  Continue weekly magnesium +/- IV magnesium.

## 2023-04-24 NOTE — Assessment & Plan Note (Signed)
Recommend patient to get dental evaluation.  Obtain CT maxillofacial wo contrast.

## 2023-04-24 NOTE — Progress Notes (Signed)
Hematology/Oncology Progress note [phone] [phone]     Chief Complaint: Sarah [name] is a 67 y.o. 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  Light chain ratio is normal. 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 Repeat immunofixation negative. . Labs reviewed and discussed with patient, stable M protein and stable light chain ratio Off Daratumumab due to frequent infections  Continue acyclovir prophylaxis.    Iron deficiency anemia due to chronic blood loss Lab Results  Component Value Date   HGB 11.2 (L) 04/24/2023   TIBC 375 12/02/2022   IRONPCTSAT 18 12/02/2022   FERRITIN 26 12/02/2022    S/p Venofer treatments. Stable Observation.    Hypomagnesemia #Chronic hypomagnesemia proceed with  IV magnesium today.  Continue weekly magnesium +/- IV magnesium.     B12 deficiency B12 level normal.  continue B12 injections Q 6 weeks.   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   Previously on Lyrica and nortriptyline-not effective Neurology recommend Tramadol and may consider higher dose of  Nortriptyline in the future.  Follows up with neurology.  Jaw pain Recommend patient to get dental evaluation.  Obtain CT maxillofacial wo contrast.     Orders Placed This Encounter  Procedures   Magnesium    Standing Status:   Standing    Number of Occurrences:   5    Standing Expiration Date:   04/23/2024   Magnesium    Standing Status:   Standing    Number of Occurrences:   1    Standing Expiration  Date:   04/23/2024   Kappa/lambda light chains    Standing Status:   Standing    Number of Occurrences:   1    Standing Expiration Date:   04/23/2024   Multiple Myeloma Panel (SPEP&IFE w/QIG)    Standing Status:   Standing    Number of Occurrences:   1    Standing Expiration Date:   04/23/2024   CBC with Differential/Platelet    Standing Status:   Standing    Number of Occurrences:   1    Standing Expiration Date:   04/23/2024   Vitamin B12    Standing Status:   Standing    Number of Occurrences:   1    Standing Expiration Date:   04/23/2024    Follow up  Lab Mag weekly x5  + IV mag.  6 weeks flex lab MD IV mag,  same labs  All questions were answered. The patient knows to call the clinic with any problems, questions or concerns.  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  Initial bone marrow revealed 80% plasma cells.  Lambda free light chains were 1340.  She had nephrotic range proteinuria.  She initially underwent induction with RVD.  Revlimid maintenance was discontinued on 01/21/2017 secondary to intolerance.     07/19/2019 Bone marrow aspirate and biopsy on  revealed a normocellular marrow with but increased lambda-restricted plasma cells (9% aspirate, 40% CD138 immunohistochemistry).  Findings were consistent with recurrent plasma cell myeloma.  Flow cytometry revealed no monoclonal B-cell or phenotypically aberrant T-cell population.  Cytogenetics were 101, XX (normal).  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    Lambda light chains have been followed: 22.2 (ratio 0.56) on 07/03/2017, 30.8 (ratio 0.78) on 09/02/2017, 36.9 (ratio 0.40) on 10/21/2017, 37.4 (ratio 0.41) on 12/16/2017, 70.7(ratio 0.31)  on 02/17/2018, 64.2 (ratio 0.27) on 04/07/2018, 78.9 (ratio 0.18) on 05/26/2018, 128.8 (ratio 0.17) on 08/06/2018, 181.5 (ratio 0.13) on 10/08/2018, 130.9 (ratio 0.13) on 10/20/2018, 160.7 (ratio 0.10) on 12/09/2018, 236.6 (ratio 0.07) on 02/01/2019, 363.6 (ratio 0.04) on 03/22/2019, 404.8 (ratio 0.04) on 04/05/2019, 420.7 (ratio 0.03) on 05/24/2019, 573.4 (ratio 0.03) on 06/23/2019, 451.05 (ratio 0.02) on 08/20/2019, 47.2 (ratio 0.13) on 10/25/2019, 22.4 (ratio 0.21) on 11/25/2019, 16.5 (ratio 0.33) on 01/10/2020, 14.6 (ratio 0.34) on 02/07/2020, 13.1 (ratio 0.31) on 03/07/2020, 10.1 (ratio 0.38) on 04/10/2020, and 9.5 (ratio 0.21) on 06/19/2020.   24 hour UPEP on 06/03/2019 revealed kappa free light chains 95.76, lambda free light chains 1,260.71, and ratio 0.08.  24 hour UPEP on 08/23/2019 revealed total protein of 782 mg/24 hrs with lambda free light chains 1,084.16 mg/L and ratio of 0.10 (1.03-31.76).  M spike in urine was 46.1% (361 mg/24 hrs).    Bone survey on 04/08/2016 and 05/28/2017 revealed no definite lytic lesion seen in the visualized skeleton.  Bone survey on 11/19/2018 revealed no suspicious lucent lesions and no acute bony abnormality.  PET scan on 07/12/2019 revealed no focal metabolic activity to suggest active myeloma within the skeleton. There were no lytic lesions identified on the CT portion of the exam or soft tissue plasmacytomas. There was no evidence of multiple myeloma.    Pretreatment RBC phenotype on 09/23/2019 was positive for C, e, DUFFY B, KIDD B, M, S, and s antigen; negative for c, E, KELL, DUFFY A, KIDD A, and N antigen.    09/27/2019 - 10/25/2019; 12/09/2019 - 03/13/2020 6 cycles of  daratumumab and hyaluronidase-fihj, Pomalyst, and Decadron (DPd) .  Cycle #1 was complicated by fever and neutropenia requiring admission.  Cycle #2 was complicated by pneumonia requiring admission.  Cycle #6 was complicated with an ER evaluation for an elevated lactic acid.   05/10/2020 second autologous stem cell transplant at [facility]   She underwent conditioning with melphalan 140 mg/m2.    She is s/p week #3 Velcade maintenance (began 08/31/2020 - 09/28/2020).  She has no increase in neuropathy.  She has severe aortic stenosis.  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 Cardiology is following for a possible TAVR in the future.  Echo on 07/05/2020 revealed moderate to severe aortic stenosis with an EF of 50-55%.  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 Zometa was discontinued in 01/2017.  She has chronic nausea on Phenergan.     B12 deficiency.  B12 was 254 on 04/09/2017, 295 on 08/20/2018, and 391 on 10/08/2018.  She was on oral B12.  She received B12 monthly (last 09/14/2020).  Folate was 12.1 on 01/10/2020.    She has iron deficiency.  Ferritin was 32 on 07/01/2019.  She received Venofer on 07/15/2019 and 07/22/2019.   She has hypogammaglobulinemia.  IgG was 245 on 11/25/2019.  She received monthly IVIG (07/22/021 - 03/22/2020).  She received IVIG 400 mg/kg on 01/20/2020, 200 mg/kg on 02/17/2020, and 300 mg/kg on 03/22/2020.  IVIG on 01/20/2020 was complicated by acute renal failure. IgG trough level was 418 on 02/17/2020.  10/15/2019 - 10/20/2019 She was admitted to [facility] with fever and neutropenia.  Cultures were negative.  CXR was negative. She received broad spectrum antibiotics and daily Granix.  She received IVF for acute renal insufficiency due to diarrhea and dehydration.  Creatine was 1.66 on admission and 1.12 on discharge.    03/01/2020 - 03/05/2020 admitted to [facility] from with fever  and neutropenia. CXR revealed no active cardiopulmonary disease. Chest CT with contrast revealed no acute intrathoracic pathology. There were findings which could be suggestive of prior granulomatous disease. She was treated with Cefepime and Vancomycin, then switched to ciprofloxacin on 03/03/2020.   05/08/2020 - 12/05/2021The patient was admitted to [facility] from  for autologous bone marrow transplant.   She received conditioning with melphalan 140 mg/m2.  Bone marrow transplant was on 05/10/2020. The patient developed fevers daily from 05/19/2020 - 05/24/2020. Blood cultures were + for strept sanguis bacteremia.  She was treated cefepime, vancomycin, and solumedrol for possible engraftment syndrome. Cefepime was switched to ceftriaxone; she completed 2 weeks of ceftriaxone on 06/03/2020. She had chemotherapy induced diarrhea.  She experienced urinary retention.  Feb 2022 patient has been on maintenance Velcade 2 mg every 2 weeks.  Chronic diarrhea seen by gastroenterology Dr. Tobi Bastos and was recommended to avoid artificial sugars in her diet including Diet Coke and coffee mate.  She feels some improvement of her diarrhea frequency since the dietary modification.  GI profile PCR negative, C. difficile toxin A+ B negative, fecal calprotectin 77 12/19/2020 colonoscopy showed 2 subcentimeter polyps in the ascending colon, resected and removed.  Pathology showed tubular adenoma.  No malignancy.  Normal mucosa in the entire examined colon.  Biopsied, pathology showed colonic mucosa with no significant pathological alteration.  Negative for active inflammation and features of chronicity.  Negative for microscopic colitis, dysplasia, and malignanc  Diabetes, patient has discontinued metformin due to diarrhea and started on glipizide 2.5 mg   03/05/2021-03/09/2021 Patient was hospitalized due to fever and chills, nonproductive cough, shortness of breath. X-ray showed right lower lobe atelectasis versus pneumonia.   Blood cultures negative.  Urine culture showed 60,000 colonies of Enterococcus facialis.  Patient received antibiotics. Patient also had abdominal pelvis CT scan for work-up of intermittent lower abdomen pain.No acute abnormality.   05/14/21 last dose of Velcade maintenance.  establish care with neurology Dr. Theora Master and her neuropathy regimen has been switched from gabapentin to Lyrica. 05/29/2021 patient got influenza   neuropathy medication has been switched from gabapentin/Lyrica to nortriptyline.  08/18/2021 Hospitalized due to pneumonia from metapnuemovirus, hospitalization was complicated with NSTEMI. She received heparin gtt.  Treated with antibiotics for coverage of pneumonia,  diuretics for pulmonary edema. She received IVIG 400mg /kg x 1 for hypoglobulinemia. Discharged on 08/25/21 with Augmentin and Zithromax.   May 2023, Daratumumab being held for [facility] infectious disease physician Dr. Kennedy Bucker due to frequent infection.  Diagnosed with CHF [HFrEF] -NYHA class II-III, she follows up with cardiology, started on entresto  hospitalization due to CHF exacerbation, community acquired pneumonia.   Presented to emergency  room on 07/01/2022 Respiratory panel RT-PCR negative for COVID-19, influenza and RSV.   INTERVAL HISTORY Sarah [name] is a 67 y.o. female who has above history reviewed by me today presents for follow up visit for management of multiple myeloma.  Patient has been on weekly magnesium infusion as needed hypomagnesemia + CHF [nonischemic cardiomyopathy], aortic stenosis, follows up with cardiology. E coli and campylobacter enteritis resolved. Repeat stool testing negative.  Was seen by Dr. Tobi Bastos recently. Recommendation was reviewed.  + left jaw pain. Not on bisphosphonate due to history of previous jaw necrosis on zometa.    Past Medical History:  Diagnosis Date   Anemia    Anxiety    Aortic stenosis    a. 05/2020 Echo: EF 45-50%, sev AS - seen by TAVR team @  [facility] - CTA sugg of tricuspid valve w/ fusing of 2 leaflets-TAVR deferred in setting of acute infxn; b. 07/2020 Echo: EF 50-55%, mod-sev AS; c. 12/2020 Echo: EF>55%. Mod-sev paradoxical low-flow low-gradient AS; d. 08/2021 Echo: EF 35-40%, severe AS, triv AI, mild MR.   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 Variable EF over time; b. 08/2017 Echo: EF 40%; b. 03/2020 Echo: EF 55-60%; c. 05/2020 Echo: EF 45-50%; d. 07/2020 Echo: EF 50-55%; e. 12/2020 Echo: EF>55%; f. 08/2021 Echo: EF 35-40%.   Chronic heart failure with preserved ejection fraction (HFpEF) (HCC)    a. Variable EF over time; b. 08/2017 Echo: EF 40%; b. 03/2020 Echo: EF 55-60%; c. 05/2020 Echo: EF 45-50%; d. 07/2020 Echo: EF 50-55%; e. 12/2020 Echo: EF>55%; f. 08/2021 Echo: EF 35-40%, no RV, mildly dil LA, mild MR, triv AI, severe AS.   CKD (chronic kidney disease) stage 3, GFR 30-59 ml/min (HCC)    Depression    Diabetes mellitus (HCC)    Dizziness    Fatty liver    Frequent falls    GERD (gastroesophageal reflux disease)    Gout    Heart murmur    History of blood transfusion    History of bone marrow transplant (HCC)    History of uterine fibroid    Hx of cardiac catheterization    a. 01/2016 Cath [facility] - after abnl nuc): Nl cors.   Hypertension    Hypomagnesemia    IDA (iron deficiency anemia)    Multiple myeloma (HCC)    NSTEMI (non-ST elevated myocardial infarction) (HCC) 08/21/2021   Personal history of chemotherapy    Pleurisy 04/09/2022   PSVT (paroxysmal supraventricular tachycardia) (HCC)    a. S/p RFCA [facility]).   PVC's (premature ventricular contractions)    a. Well-managed w/ bisoprolol in outpt setting.   Renal cyst     Past Surgical History:  Procedure Laterality Date   ABDOMINAL HYSTERECTOMY     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   CARDIAC ELECTROPHYSIOLOGY MAPPING AND ABLATION      CARPAL TUNNEL RELEASE Bilateral    CHOLECYSTECTOMY  2008   COLONOSCOPY WITH PROPOFOL N/A 05/07/2017   Procedure: COLONOSCOPY WITH PROPOFOL;  Surgeon: Wyline Mood, MD;  Location: [facility] ENDOSCOPY;  Service: Gastroenterology;  Laterality: N/A;   COLONOSCOPY WITH PROPOFOL N/A 12/19/2020   Procedure: COLONOSCOPY WITH PROPOFOL;  Surgeon: Wyline Mood, MD;  Location: [facility] ENDOSCOPY;  Service: Gastroenterology;  Laterality: N/A;   ESOPHAGOGASTRODUODENOSCOPY (EGD) WITH PROPOFOL N/A 05/07/2017   Procedure: ESOPHAGOGASTRODUODENOSCOPY (EGD) WITH PROPOFOL;  Surgeon: Wyline Mood,  MD;  Location: [facility] ENDOSCOPY;  Service: Gastroenterology;  Laterality: N/A;   FOOT SURGERY Bilateral    INCONTINENCE SURGERY  2009   INTERSTIM IMPLANT PLACEMENT     other     over active bladder   OTHER SURGICAL HISTORY     bladder stimulator    PARTIAL HYSTERECTOMY  03/1996   fibroids   PORTA CATH INSERTION N/A 03/10/2019   Procedure: PORTA CATH INSERTION;  Surgeon: Annice Needy, MD;  Location: [facility] INVASIVE CV LAB;  Service: Cardiovascular;  Laterality: N/A;   TONSILLECTOMY  2007    Family History  Problem Relation Age of Onset   Colon cancer Father    Renal Disease Father    Diabetes Mellitus II Father    Melanoma Paternal Grandmother    Breast cancer Maternal Aunt 75   Anemia Mother    Heart disease Mother    Heart failure Mother    Renal Disease Mother    Congestive Heart Failure Mother    Heart disease Maternal Uncle    Throat cancer Maternal Uncle    Lung cancer Maternal Uncle    Liver disease Maternal Uncle    Heart failure Maternal Uncle    Hearing loss Son 41       Suicide     Social History:  reports that she quit smoking about 31 years ago. Her smoking use included cigarettes. She started smoking about 51 years ago. She has a 20 pack-year smoking history. She has never used smokeless tobacco. She reports current alcohol use of about 2.0 standard drinks of alcohol per week. She reports that she does not  use drugs.  She is on disability. She notes exposure to perchloroethylene [facility]).   Allergies:  Allergies  Allergen Reactions   Oxycodone-Acetaminophen Anaphylaxis    Swelling and rash   Celebrex [Celecoxib] Diarrhea   Codeine    Plerixafor     In 2016 during ASCT collection patient developed fever to 103.81F and required hospitalization   Benadryl [Diphenhydramine] Palpitations   Morphine Itching and Rash   Ondansetron Diarrhea   Tylenol [Acetaminophen] Itching and Rash    Current Medications: Current Outpatient Medications  Medication Sig Dispense Refill   allopurinol (ZYLOPRIM) 100 MG tablet TAKE 1 TABLET(100 MG) BY MOUTH DAILY as needed 90 tablet 1   amiodarone (PACERONE) 200 MG tablet Take 200 mg by mouth daily.     atorvastatin (LIPITOR) 10 MG tablet Take 0.5 tablets (5 mg total) by mouth daily. 45 tablet 1   bisoprolol (ZEBETA) 10 MG tablet Take 1 tablet (10 mg total) by mouth daily. 90 tablet 1   calcium carbonate (OS-CAL) 1250 (500 Ca) MG chewable tablet Chew 2 tablets by mouth daily.     diazepam (VALIUM) 5 MG tablet Take 0.5-1 tablets (2.5-5 mg total) by mouth at bedtime as needed (insomnia). 30 tablet 0   diclofenac sodium (VOLTAREN) 1 % GEL Apply 2 g topically 4 (four) times daily. 100 g 1   diphenoxylate-atropine (LOMOTIL) 2.5-0.025 MG tablet Take 2 tablets by mouth 4 (four) times daily as needed for diarrhea or loose stools. 180 tablet 3   FLUoxetine (PROZAC) 20 MG capsule Take 1 capsule (20 mg total) by mouth daily. 30 capsule 0   furosemide (LASIX) 40 MG tablet Take 0.5 tablets (20 mg total) by mouth daily. Home dose.     glipiZIDE (GLUCOTROL XL) 2.5 MG 24 hr tablet Take 1 tablet (2.5 mg total) by mouth daily with breakfast. 90 tablet 1   glucose  blood test strip      lidocaine (XYLOCAINE) 2 % solution Use as directed 15 mLs in the mouth or throat every 3 (three) hours as needed for mouth pain (swish and spit). 100 mL 0   losartan (COZAAR) 25 MG tablet Take 1 tablet  by mouth daily.     magnesium chloride (SLOW-MAG) 64 MG TBEC SR tablet Take 1 tablet (64 mg total) by mouth daily. 60 tablet 1   Multiple Vitamin (MULTIVITAMIN) tablet Take 1 tablet by mouth daily.     omeprazole (PRILOSEC) 40 MG capsule TAKE 1 CAPSULE IN THE MORNING AND TAKE 1 CAPSULE AT BEDTIME 180 capsule 3   pentoxifylline (TRENTAL) 400 MG CR tablet Take 400 mg by mouth 3 (three) times daily with meals.     potassium chloride SA (KLOR-CON M) 20 MEQ tablet Take 1 tablet (20 mEq total) by mouth daily. 7 tablet 0   promethazine (PHENERGAN) 25 MG tablet Take 1 tablet (25 mg total) by mouth every 6 (six) hours as needed for nausea or vomiting. 90 tablet 1   spironolactone (ALDACTONE) 25 MG tablet Take by mouth.     tiZANidine (ZANAFLEX) 4 MG tablet TAKE 1 TABLET(4 MG) BY MOUTH EVERY 6 HOURS AS NEEDED FOR MUSCLE SPASMS 90 tablet 0   traMADol (ULTRAM) 50 MG tablet      traZODone (DESYREL) 100 MG tablet TAKE 1 TABLET AT BEDTIME 90 tablet 3   vitamin E 45 MG (100 UNITS) capsule Take by mouth daily.     No current facility-administered medications for this visit.   Facility-Administered Medications Ordered in Other Visits  Medication Dose Route Frequency Provider Last Rate Last Admin   heparin lock flush 100 unit/mL  500 Units Intravenous Once Louretta Shorten R, MD       magnesium sulfate IVPB 2 g 50 mL  2 g Intravenous Once Rickard Patience, MD 50 mL/hr at 04/24/23 1324 2 g at 04/24/23 1324   sodium chloride flush (NS) 0.9 % injection 10 mL  10 mL Intravenous PRN Rickard Patience, MD   10 mL at 04/02/21 6440    Review of Systems  Constitutional:  Negative for chills, fever, malaise/fatigue and weight loss.  HENT:  Negative for sore throat.   Eyes:  Negative for redness.  Respiratory:  Negative for cough, shortness of breath and wheezing.   Cardiovascular:  Negative for chest pain, palpitations and leg swelling.  Gastrointestinal:  Positive for diarrhea. Negative for abdominal pain, blood in stool, nausea  and vomiting.  Genitourinary:  Negative for dysuria.  Musculoskeletal:  Positive for back pain and joint pain. Negative for myalgias.  Skin:  Negative for rash.  Neurological:  Positive for tingling and sensory change. Negative for dizziness and tremors.  Endo/Heme/Allergies:  Does not bruise/bleed easily.  Psychiatric/Behavioral:  Negative for hallucinations.     Performance status (ECOG): 1  Vitals Blood pressure 135/86, pulse 71, temperature 97.9 F (36.6 C), temperature source Tympanic, resp. rate 18, weight 160 lb 4.8 oz (72.7 kg), SpO2 99%.  Physical Exam Constitutional:      General: She is not in acute distress.    Appearance: She is not diaphoretic.  HENT:     Head: Normocephalic and atraumatic.  Eyes:     General: No scleral icterus.    Pupils: Pupils are equal, round, and reactive to light.  Cardiovascular:     Rate and Rhythm: Normal rate.     Heart sounds: Murmur heard.  Pulmonary:     Effort: Pulmonary  effort is normal. No respiratory distress.     Breath sounds: No rales.  Chest:     Chest wall: No tenderness.  Abdominal:     General: There is no distension.  Musculoskeletal:        General: Normal range of motion.     Cervical back: Normal range of motion and neck supple.  Skin:    General: Skin is warm and dry.     Findings: No erythema.     Comments: Lower extremity cat bite cellulitis has improved.   Neurological:     Mental Status: She is alert and oriented to person, place, and time.     Cranial Nerves: No cranial nerve deficit.     Motor: No abnormal muscle tone.     Coordination: Coordination normal.  Psychiatric:        Mood and Affect: Mood and affect normal.      Laboratory data    Latest Ref Rng & Units 04/24/2023    9:52 AM 03/13/2023    8:23 AM 02/24/2023    9:34 AM  CBC  WBC 4.0 - 10.5 K/uL 4.9  6.0  7.3   Hemoglobin 12.0 - 15.0 g/dL 96.0  45.4  09.8   Hematocrit 36.0 - 46.0 % 33.0  33.6  37.0   Platelets 150 - 400 K/uL 127  105   150       Latest Ref Rng & Units 04/24/2023    9:52 AM 03/13/2023    8:23 AM 02/27/2023    8:33 AM  CMP  Glucose 70 - 99 mg/dL 119  147  829   BUN 8 - 23 mg/dL 27  18  25    Creatinine 0.44 - 1.00 mg/dL 5.62  1.30  8.65   Sodium 135 - 145 mmol/L 137  138  136   Potassium 3.5 - 5.1 mmol/L 4.5  4.1  4.4   Chloride 98 - 111 mmol/L 108  105  108   CO2 22 - 32 mmol/L 22  26  22    Calcium 8.9 - 10.3 mg/dL 9.3  8.9  9.8   Total Protein 6.5 - 8.1 g/dL 7.0  7.3  7.2   Total Bilirubin 0.3 - 1.2 mg/dL 0.5  0.4  0.2   Alkaline Phos 38 - 126 U/L 75  99  78   AST 15 - 41 U/L 28  26  24    ALT 0 - 44 U/L 28  29  27

## 2023-04-24 NOTE — Assessment & Plan Note (Signed)
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  Light chain ratio is normal. 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 Repeat immunofixation negative. . Labs reviewed and discussed with patient, stable M protein and stable light chain ratio Off Daratumumab due to frequent infections  Continue acyclovir prophylaxis.

## 2023-04-24 NOTE — Assessment & Plan Note (Signed)
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   Previously on Lyrica and nortriptyline-not effective Neurology recommend Tramadol and may consider higher dose of  Nortriptyline in the future. Follows up with neurology. 

## 2023-04-25 DIAGNOSIS — M5412 Radiculopathy, cervical region: Secondary | ICD-10-CM | POA: Diagnosis not present

## 2023-04-25 DIAGNOSIS — M9902 Segmental and somatic dysfunction of thoracic region: Secondary | ICD-10-CM | POA: Diagnosis not present

## 2023-04-25 DIAGNOSIS — M62830 Muscle spasm of back: Secondary | ICD-10-CM | POA: Diagnosis not present

## 2023-04-25 DIAGNOSIS — M9901 Segmental and somatic dysfunction of cervical region: Secondary | ICD-10-CM | POA: Diagnosis not present

## 2023-04-25 LAB — KAPPA/LAMBDA LIGHT CHAINS
Kappa free light chain: 21.7 mg/L — ABNORMAL HIGH (ref 3.3–19.4)
Kappa, lambda light chain ratio: 0.94 (ref 0.26–1.65)
Lambda free light chains: 23 mg/L (ref 5.7–26.3)

## 2023-04-25 NOTE — Progress Notes (Signed)
Subjective:  Patient ID: Sarah [name], female    DOB: 02/19/1956,  MRN: [id number]  67 y.o. female presents to clinic with  painful elongated mycotic toenails 1-5 bilaterally which are tender when wearing enclosed shoe gear. Pain is relieved with periodic professional debridement.    New problem(s): None   PCP is [name], MD.  Allergies  Allergen Reactions   Oxycodone-Acetaminophen Anaphylaxis    Swelling and rash   Celebrex [Celecoxib] Diarrhea   Codeine    Plerixafor     In 2016 during ASCT collection patient developed fever to 103.62F and required hospitalization   Benadryl [Diphenhydramine] Palpitations   Morphine Itching and Rash   Ondansetron Diarrhea   Tylenol [Acetaminophen] Itching and Rash    Review of Systems: Negative except as noted in the HPI.   Objective:  Sarah [name] is a pleasant 67 y.o. female WD, WN in NAD. AAO x 3.  Vascular Examination: Vascular status intact b/l with palpable pedal pulses. CFT immediate b/l. No edema. No pain with calf compression b/l. Skin temperature gradient WNL b/l. No cyanosis or clubbing noted b/l LE.  Neurological Examination: Sensation grossly intact b/l with 10 gram monofilament. Vibratory sensation intact b/l.   Dermatological Examination: Pedal skin with normal turgor, texture and tone b/l. Toenails 1-5 b/l thick, discolored, elongated with subungual debris and pain on dorsal palpation. No hyperkeratotic lesions noted b/l.   Musculoskeletal Examination: Muscle strength 5/5 to b/l LE. No pain, crepitus or joint limitation noted with ROM bilateral LE. No gross bony deformities bilaterally.  Radiographs: None  Last A1c:      Latest Ref Rng & Units 12/11/2022    8:52 AM 06/12/2022    9:36 AM  Hemoglobin A1C  Hemoglobin-A1c 4.8 - 5.6 % 6.5  6.6      Assessment:   1. Pain due to onychomycosis of toenails of both feet    Plan:  Patient was evaluated and treated. All patient's and/or POA's  questions/concerns addressed on today's visit. Toenails 1-5 debrided in length and girth without incident. Continue soft, supportive shoe gear daily. Report any pedal injuries to medical professional. Call office if there are any questions/concerns. -Patient/POA to call should there be question/concern in the interim.  Return in about 3 months (around 07/18/2023).  Freddie Breech, DPM

## 2023-04-27 ENCOUNTER — Encounter: Payer: Self-pay | Admitting: Internal Medicine

## 2023-04-27 ENCOUNTER — Encounter: Payer: Self-pay | Admitting: Oncology

## 2023-04-28 ENCOUNTER — Other Ambulatory Visit: Payer: Self-pay

## 2023-04-28 DIAGNOSIS — R110 Nausea: Secondary | ICD-10-CM

## 2023-04-28 DIAGNOSIS — M9902 Segmental and somatic dysfunction of thoracic region: Secondary | ICD-10-CM | POA: Diagnosis not present

## 2023-04-28 DIAGNOSIS — M5412 Radiculopathy, cervical region: Secondary | ICD-10-CM | POA: Diagnosis not present

## 2023-04-28 DIAGNOSIS — M62830 Muscle spasm of back: Secondary | ICD-10-CM | POA: Diagnosis not present

## 2023-04-28 DIAGNOSIS — M9901 Segmental and somatic dysfunction of cervical region: Secondary | ICD-10-CM | POA: Diagnosis not present

## 2023-04-28 LAB — MULTIPLE MYELOMA PANEL, SERUM
Albumin SerPl Elph-Mcnc: 3.9 g/dL (ref 2.9–4.4)
Albumin/Glob SerPl: 1.4 (ref 0.7–1.7)
Alpha 1: 0.2 g/dL (ref 0.0–0.4)
Alpha2 Glob SerPl Elph-Mcnc: 0.9 g/dL (ref 0.4–1.0)
B-Globulin SerPl Elph-Mcnc: 1.1 g/dL (ref 0.7–1.3)
Gamma Glob SerPl Elph-Mcnc: 0.8 g/dL (ref 0.4–1.8)
Globulin, Total: 3 g/dL (ref 2.2–3.9)
IgA: 236 mg/dL (ref 87–352)
IgG (Immunoglobin G), Serum: 757 mg/dL (ref 586–1602)
IgM (Immunoglobulin M), Srm: 27 mg/dL (ref 26–217)
Total Protein ELP: 6.9 g/dL (ref 6.0–8.5)

## 2023-04-28 MED ORDER — PROMETHAZINE HCL 25 MG PO TABS: 25.0000 mg | ORAL_TABLET | Freq: Four times a day (QID) | ORAL | 1 refills | Status: DC | PRN

## 2023-04-28 NOTE — Progress Notes (Signed)
Error

## 2023-04-28 NOTE — Telephone Encounter (Signed)
Please review.  KP

## 2023-04-30 DIAGNOSIS — M5412 Radiculopathy, cervical region: Secondary | ICD-10-CM | POA: Diagnosis not present

## 2023-04-30 DIAGNOSIS — M9901 Segmental and somatic dysfunction of cervical region: Secondary | ICD-10-CM | POA: Diagnosis not present

## 2023-04-30 DIAGNOSIS — M62830 Muscle spasm of back: Secondary | ICD-10-CM | POA: Diagnosis not present

## 2023-04-30 DIAGNOSIS — M9902 Segmental and somatic dysfunction of thoracic region: Secondary | ICD-10-CM | POA: Diagnosis not present

## 2023-05-01 ENCOUNTER — Inpatient Hospital Stay: Payer: Medicare PPO

## 2023-05-01 VITALS — BP 128/66 | HR 66 | Temp 97.4°F | Resp 20

## 2023-05-01 DIAGNOSIS — T451X5A Adverse effect of antineoplastic and immunosuppressive drugs, initial encounter: Secondary | ICD-10-CM | POA: Diagnosis not present

## 2023-05-01 DIAGNOSIS — I350 Nonrheumatic aortic (valve) stenosis: Secondary | ICD-10-CM | POA: Diagnosis not present

## 2023-05-01 DIAGNOSIS — Z9484 Stem cells transplant status: Secondary | ICD-10-CM | POA: Diagnosis not present

## 2023-05-01 DIAGNOSIS — E538 Deficiency of other specified B group vitamins: Secondary | ICD-10-CM | POA: Diagnosis not present

## 2023-05-01 DIAGNOSIS — G620 Drug-induced polyneuropathy: Secondary | ICD-10-CM | POA: Diagnosis not present

## 2023-05-01 DIAGNOSIS — R6884 Jaw pain: Secondary | ICD-10-CM | POA: Diagnosis not present

## 2023-05-01 DIAGNOSIS — C9001 Multiple myeloma in remission: Secondary | ICD-10-CM

## 2023-05-01 DIAGNOSIS — D500 Iron deficiency anemia secondary to blood loss (chronic): Secondary | ICD-10-CM | POA: Diagnosis not present

## 2023-05-01 LAB — MAGNESIUM: Magnesium: 1.2 mg/dL — ABNORMAL LOW (ref 1.7–2.4)

## 2023-05-01 MED ORDER — SODIUM CHLORIDE 0.9 % IV SOLN
25.0000 mg | Freq: Once | INTRAVENOUS | Status: AC
Start: 2023-05-01 — End: 2023-05-01
  Administered 2023-05-01: 25 mg via INTRAVENOUS
  Filled 2023-05-01: qty 1

## 2023-05-01 MED ORDER — MAGNESIUM SULFATE 4 GM/100ML IV SOLN
4.0000 g | Freq: Once | INTRAVENOUS | Status: AC
  Administered 2023-05-01: 4 g via INTRAVENOUS
  Filled 2023-05-01: qty 100

## 2023-05-01 MED ORDER — MAGNESIUM SULFATE 2 GM/50ML IV SOLN
2.0000 g | Freq: Once | INTRAVENOUS | Status: AC
  Administered 2023-05-01: 2 g via INTRAVENOUS
  Filled 2023-05-01: qty 50

## 2023-05-01 MED ORDER — HEPARIN SOD (PORK) LOCK FLUSH 100 UNIT/ML IV SOLN
500.0000 [IU] | Freq: Once | INTRAVENOUS | Status: AC | PRN
Start: 2023-05-01 — End: 2023-05-01
  Administered 2023-05-01: 500 [IU]
  Filled 2023-05-01: qty 5

## 2023-05-01 MED ORDER — SODIUM CHLORIDE 0.9% FLUSH
10.0000 mL | Freq: Once | INTRAVENOUS | Status: AC | PRN
Start: 2023-05-01 — End: 2023-05-01
  Administered 2023-05-01: 10 mL
  Filled 2023-05-01: qty 10

## 2023-05-02 DIAGNOSIS — M9901 Segmental and somatic dysfunction of cervical region: Secondary | ICD-10-CM | POA: Diagnosis not present

## 2023-05-02 DIAGNOSIS — M62830 Muscle spasm of back: Secondary | ICD-10-CM | POA: Diagnosis not present

## 2023-05-02 DIAGNOSIS — M5412 Radiculopathy, cervical region: Secondary | ICD-10-CM | POA: Diagnosis not present

## 2023-05-02 DIAGNOSIS — M9902 Segmental and somatic dysfunction of thoracic region: Secondary | ICD-10-CM | POA: Diagnosis not present

## 2023-05-03 ENCOUNTER — Other Ambulatory Visit: Payer: Self-pay | Admitting: Internal Medicine

## 2023-05-03 DIAGNOSIS — F324 Major depressive disorder, single episode, in partial remission: Secondary | ICD-10-CM

## 2023-05-05 DIAGNOSIS — M62830 Muscle spasm of back: Secondary | ICD-10-CM | POA: Diagnosis not present

## 2023-05-05 DIAGNOSIS — M9902 Segmental and somatic dysfunction of thoracic region: Secondary | ICD-10-CM | POA: Diagnosis not present

## 2023-05-05 DIAGNOSIS — M9901 Segmental and somatic dysfunction of cervical region: Secondary | ICD-10-CM | POA: Diagnosis not present

## 2023-05-05 DIAGNOSIS — M5412 Radiculopathy, cervical region: Secondary | ICD-10-CM | POA: Diagnosis not present

## 2023-05-05 NOTE — Telephone Encounter (Signed)
Requested Prescriptions  Pending Prescriptions Disp Refills   FLUoxetine (PROZAC) 20 MG capsule [Pharmacy Med Name: FLUOXETINE 20MG  CAPSULES] 30 capsule 2    Sig: TAKE 1 CAPSULE(20 MG) BY MOUTH DAILY     Psychiatry:  Antidepressants - SSRI Passed - 05/03/2023 10:46 AM      Passed - Completed PHQ-2 or PHQ-9 in the last 360 days      Passed - Valid encounter within last 6 months    Recent Outpatient Visits           4 months ago Annual physical exam   [place] at [facility], Nyoka Cowden, MD   6 months ago Cat bite of ankle, left, subsequent encounter   [facility] at [facility], Ocie Bob, MD   7 months ago Acute frontal sinusitis, recurrence not specified   [facility] at [facility], Ocie Bob, MD   1 year ago Bacterial URI   [facility] at [facility], Ocie Bob, MD   1 year ago Annual physical exam   [facility] at [facility], Nyoka Cowden, MD       Future Appointments             In 4 weeks Judithann Graves, Nyoka Cowden, MD [facility] at [facility]   In 1 month Wyline Mood, MD [facility] at [place]   In 7 months Judithann Graves, Nyoka Cowden, MD [facility] at [facility], [facility]

## 2023-05-08 ENCOUNTER — Inpatient Hospital Stay: Payer: Medicare PPO

## 2023-05-08 ENCOUNTER — Inpatient Hospital Stay: Payer: Medicare PPO | Attending: Oncology

## 2023-05-08 VITALS — BP 147/67 | HR 51 | Temp 96.0°F

## 2023-05-08 DIAGNOSIS — Z803 Family history of malignant neoplasm of breast: Secondary | ICD-10-CM | POA: Diagnosis not present

## 2023-05-08 DIAGNOSIS — Z833 Family history of diabetes mellitus: Secondary | ICD-10-CM | POA: Diagnosis not present

## 2023-05-08 DIAGNOSIS — Z808 Family history of malignant neoplasm of other organs or systems: Secondary | ICD-10-CM | POA: Insufficient documentation

## 2023-05-08 DIAGNOSIS — C9001 Multiple myeloma in remission: Secondary | ICD-10-CM | POA: Diagnosis not present

## 2023-05-08 DIAGNOSIS — Z8249 Family history of ischemic heart disease and other diseases of the circulatory system: Secondary | ICD-10-CM | POA: Insufficient documentation

## 2023-05-08 DIAGNOSIS — Z8379 Family history of other diseases of the digestive system: Secondary | ICD-10-CM | POA: Insufficient documentation

## 2023-05-08 DIAGNOSIS — Z79899 Other long term (current) drug therapy: Secondary | ICD-10-CM | POA: Insufficient documentation

## 2023-05-08 DIAGNOSIS — Z841 Family history of disorders of kidney and ureter: Secondary | ICD-10-CM | POA: Diagnosis not present

## 2023-05-08 DIAGNOSIS — R6884 Jaw pain: Secondary | ICD-10-CM | POA: Diagnosis not present

## 2023-05-08 DIAGNOSIS — Z822 Family history of deafness and hearing loss: Secondary | ICD-10-CM | POA: Insufficient documentation

## 2023-05-08 DIAGNOSIS — G629 Polyneuropathy, unspecified: Secondary | ICD-10-CM | POA: Insufficient documentation

## 2023-05-08 DIAGNOSIS — Z800 Family history of malignant neoplasm of digestive organs: Secondary | ICD-10-CM | POA: Insufficient documentation

## 2023-05-08 DIAGNOSIS — E538 Deficiency of other specified B group vitamins: Secondary | ICD-10-CM | POA: Diagnosis not present

## 2023-05-08 LAB — MAGNESIUM: Magnesium: 1.3 mg/dL — ABNORMAL LOW (ref 1.7–2.4)

## 2023-05-08 MED ORDER — MAGNESIUM SULFATE 2 GM/50ML IV SOLN
2.0000 g | Freq: Once | INTRAVENOUS | Status: AC
  Administered 2023-05-08: 2 g via INTRAVENOUS
  Filled 2023-05-08: qty 50

## 2023-05-08 MED ORDER — PROMETHAZINE HCL 25 MG/ML IJ SOLN
25.0000 mg | Freq: Once | INTRAMUSCULAR | Status: AC
  Administered 2023-05-08: 25 mg via INTRAVENOUS
  Filled 2023-05-08: qty 1

## 2023-05-08 MED ORDER — MAGNESIUM SULFATE 4 GM/100ML IV SOLN
4.0000 g | Freq: Once | INTRAVENOUS | Status: AC
Start: 2023-05-08 — End: 2023-05-08
  Administered 2023-05-08: 4 g via INTRAVENOUS
  Filled 2023-05-08: qty 100

## 2023-05-08 MED ORDER — SODIUM CHLORIDE 0.9% FLUSH
10.0000 mL | Freq: Once | INTRAVENOUS | Status: AC | PRN
  Administered 2023-05-08: 10 mL
  Filled 2023-05-08: qty 10

## 2023-05-08 MED ORDER — HEPARIN SOD (PORK) LOCK FLUSH 100 UNIT/ML IV SOLN
500.0000 [IU] | Freq: Once | INTRAVENOUS | Status: AC | PRN
  Administered 2023-05-08: 500 [IU]
  Filled 2023-05-08: qty 5

## 2023-05-09 DIAGNOSIS — M5412 Radiculopathy, cervical region: Secondary | ICD-10-CM | POA: Diagnosis not present

## 2023-05-09 DIAGNOSIS — M9902 Segmental and somatic dysfunction of thoracic region: Secondary | ICD-10-CM | POA: Diagnosis not present

## 2023-05-09 DIAGNOSIS — M9901 Segmental and somatic dysfunction of cervical region: Secondary | ICD-10-CM | POA: Diagnosis not present

## 2023-05-09 DIAGNOSIS — M62830 Muscle spasm of back: Secondary | ICD-10-CM | POA: Diagnosis not present

## 2023-05-10 ENCOUNTER — Other Ambulatory Visit: Payer: Self-pay | Admitting: Internal Medicine

## 2023-05-10 DIAGNOSIS — F5101 Primary insomnia: Secondary | ICD-10-CM

## 2023-05-12 DIAGNOSIS — M9901 Segmental and somatic dysfunction of cervical region: Secondary | ICD-10-CM | POA: Diagnosis not present

## 2023-05-12 DIAGNOSIS — M9902 Segmental and somatic dysfunction of thoracic region: Secondary | ICD-10-CM | POA: Diagnosis not present

## 2023-05-12 DIAGNOSIS — M5412 Radiculopathy, cervical region: Secondary | ICD-10-CM | POA: Diagnosis not present

## 2023-05-12 DIAGNOSIS — M62830 Muscle spasm of back: Secondary | ICD-10-CM | POA: Diagnosis not present

## 2023-05-12 NOTE — Telephone Encounter (Signed)
Requested Prescriptions  Refused Prescriptions Disp Refills   traZODone (DESYREL) 100 MG tablet [Pharmacy Med Name: traZODone HCl Oral Tablet 100 MG] 90 tablet 3    Sig: TAKE 1 TABLET AT BEDTIME     Psychiatry: Antidepressants - Serotonin Modulator Passed - 05/10/2023  3:50 AM      Passed - Completed PHQ-2 or PHQ-9 in the last 360 days      Passed - Valid encounter within last 6 months    Recent Outpatient Visits           5 months ago Annual physical exam   [place] [facility] at [facility], Nyoka Cowden, MD   6 months ago Cat bite of ankle, left, subsequent encounter   [facility] at [facility], Ocie Bob, MD   7 months ago Acute frontal sinusitis, recurrence not specified   [facility] at [facility], Ocie Bob, MD   1 year ago Bacterial URI   [facility] at [facility], Ocie Bob, MD   1 year ago Annual physical exam   [facility] at [facility], Nyoka Cowden, MD       Future Appointments             In 3 weeks Judithann Graves, Nyoka Cowden, MD [facility] at [facility], [facility]   In 4 weeks Wyline Mood, MD [facility] at [place]   In 7 months Judithann Graves, Nyoka Cowden, MD [facility] Medicine at [facility], [facility]

## 2023-05-14 DIAGNOSIS — M62830 Muscle spasm of back: Secondary | ICD-10-CM | POA: Diagnosis not present

## 2023-05-14 DIAGNOSIS — M5412 Radiculopathy, cervical region: Secondary | ICD-10-CM | POA: Diagnosis not present

## 2023-05-14 DIAGNOSIS — M9901 Segmental and somatic dysfunction of cervical region: Secondary | ICD-10-CM | POA: Diagnosis not present

## 2023-05-14 DIAGNOSIS — M9902 Segmental and somatic dysfunction of thoracic region: Secondary | ICD-10-CM | POA: Diagnosis not present

## 2023-05-15 ENCOUNTER — Inpatient Hospital Stay: Payer: Medicare PPO

## 2023-05-15 VITALS — BP 134/65 | HR 77 | Temp 96.8°F

## 2023-05-15 DIAGNOSIS — E538 Deficiency of other specified B group vitamins: Secondary | ICD-10-CM | POA: Diagnosis not present

## 2023-05-15 DIAGNOSIS — C9001 Multiple myeloma in remission: Secondary | ICD-10-CM

## 2023-05-15 DIAGNOSIS — G629 Polyneuropathy, unspecified: Secondary | ICD-10-CM | POA: Diagnosis not present

## 2023-05-15 DIAGNOSIS — R6884 Jaw pain: Secondary | ICD-10-CM | POA: Diagnosis not present

## 2023-05-15 DIAGNOSIS — Z800 Family history of malignant neoplasm of digestive organs: Secondary | ICD-10-CM | POA: Diagnosis not present

## 2023-05-15 DIAGNOSIS — Z808 Family history of malignant neoplasm of other organs or systems: Secondary | ICD-10-CM | POA: Diagnosis not present

## 2023-05-15 DIAGNOSIS — Z79899 Other long term (current) drug therapy: Secondary | ICD-10-CM | POA: Diagnosis not present

## 2023-05-15 DIAGNOSIS — Z833 Family history of diabetes mellitus: Secondary | ICD-10-CM | POA: Diagnosis not present

## 2023-05-15 LAB — MAGNESIUM: Magnesium: 1.2 mg/dL — ABNORMAL LOW (ref 1.7–2.4)

## 2023-05-15 MED ORDER — SODIUM CHLORIDE 0.9 % IV SOLN
25.0000 mg | Freq: Once | INTRAVENOUS | Status: AC
  Administered 2023-05-15: 25 mg via INTRAVENOUS
  Filled 2023-05-15: qty 1

## 2023-05-15 MED ORDER — SODIUM CHLORIDE 0.9 % IV SOLN
INTRAVENOUS | Status: DC
  Filled 2023-05-15: qty 250

## 2023-05-15 MED ORDER — MAGNESIUM SULFATE 4 GM/100ML IV SOLN
4.0000 g | Freq: Once | INTRAVENOUS | Status: AC
  Administered 2023-05-15: 4 g via INTRAVENOUS
  Filled 2023-05-15: qty 100

## 2023-05-15 MED ORDER — MAGNESIUM SULFATE 2 GM/50ML IV SOLN
2.0000 g | Freq: Once | INTRAVENOUS | Status: AC
Start: 2023-05-15 — End: 2023-05-15
  Administered 2023-05-15: 2 g via INTRAVENOUS
  Filled 2023-05-15: qty 50

## 2023-05-15 MED ORDER — SODIUM CHLORIDE 0.9% FLUSH
10.0000 mL | Freq: Once | INTRAVENOUS | Status: AC | PRN
  Administered 2023-05-15: 10 mL
  Filled 2023-05-15: qty 10

## 2023-05-15 MED ORDER — HEPARIN SOD (PORK) LOCK FLUSH 100 UNIT/ML IV SOLN
500.0000 [IU] | Freq: Once | INTRAVENOUS | Status: AC | PRN
  Administered 2023-05-15: 500 [IU]
  Filled 2023-05-15: qty 5

## 2023-05-16 DIAGNOSIS — M9901 Segmental and somatic dysfunction of cervical region: Secondary | ICD-10-CM | POA: Diagnosis not present

## 2023-05-16 DIAGNOSIS — M9902 Segmental and somatic dysfunction of thoracic region: Secondary | ICD-10-CM | POA: Diagnosis not present

## 2023-05-16 DIAGNOSIS — M5412 Radiculopathy, cervical region: Secondary | ICD-10-CM | POA: Diagnosis not present

## 2023-05-16 DIAGNOSIS — M62830 Muscle spasm of back: Secondary | ICD-10-CM | POA: Diagnosis not present

## 2023-05-19 DIAGNOSIS — M9901 Segmental and somatic dysfunction of cervical region: Secondary | ICD-10-CM | POA: Diagnosis not present

## 2023-05-19 DIAGNOSIS — M5412 Radiculopathy, cervical region: Secondary | ICD-10-CM | POA: Diagnosis not present

## 2023-05-19 DIAGNOSIS — M62830 Muscle spasm of back: Secondary | ICD-10-CM | POA: Diagnosis not present

## 2023-05-19 DIAGNOSIS — M9902 Segmental and somatic dysfunction of thoracic region: Secondary | ICD-10-CM | POA: Diagnosis not present

## 2023-05-21 ENCOUNTER — Inpatient Hospital Stay: Payer: Medicare PPO

## 2023-05-21 VITALS — BP 147/75 | HR 62 | Temp 96.0°F

## 2023-05-21 DIAGNOSIS — M5412 Radiculopathy, cervical region: Secondary | ICD-10-CM | POA: Diagnosis not present

## 2023-05-21 DIAGNOSIS — C9001 Multiple myeloma in remission: Secondary | ICD-10-CM | POA: Diagnosis not present

## 2023-05-21 DIAGNOSIS — E538 Deficiency of other specified B group vitamins: Secondary | ICD-10-CM | POA: Diagnosis not present

## 2023-05-21 DIAGNOSIS — Z95828 Presence of other vascular implants and grafts: Secondary | ICD-10-CM

## 2023-05-21 DIAGNOSIS — Z79899 Other long term (current) drug therapy: Secondary | ICD-10-CM | POA: Diagnosis not present

## 2023-05-21 DIAGNOSIS — Z808 Family history of malignant neoplasm of other organs or systems: Secondary | ICD-10-CM | POA: Diagnosis not present

## 2023-05-21 DIAGNOSIS — M9901 Segmental and somatic dysfunction of cervical region: Secondary | ICD-10-CM | POA: Diagnosis not present

## 2023-05-21 DIAGNOSIS — Z800 Family history of malignant neoplasm of digestive organs: Secondary | ICD-10-CM | POA: Diagnosis not present

## 2023-05-21 DIAGNOSIS — M62830 Muscle spasm of back: Secondary | ICD-10-CM | POA: Diagnosis not present

## 2023-05-21 DIAGNOSIS — G629 Polyneuropathy, unspecified: Secondary | ICD-10-CM | POA: Diagnosis not present

## 2023-05-21 DIAGNOSIS — M9902 Segmental and somatic dysfunction of thoracic region: Secondary | ICD-10-CM | POA: Diagnosis not present

## 2023-05-21 DIAGNOSIS — R6884 Jaw pain: Secondary | ICD-10-CM | POA: Diagnosis not present

## 2023-05-21 DIAGNOSIS — Z833 Family history of diabetes mellitus: Secondary | ICD-10-CM | POA: Diagnosis not present

## 2023-05-21 LAB — MAGNESIUM: Magnesium: 1.2 mg/dL — ABNORMAL LOW (ref 1.7–2.4)

## 2023-05-21 MED ORDER — SODIUM CHLORIDE 0.9 % IV SOLN
Freq: Once | INTRAVENOUS | Status: AC
  Filled 2023-05-21: qty 250

## 2023-05-21 MED ORDER — HEPARIN SOD (PORK) LOCK FLUSH 100 UNIT/ML IV SOLN
500.0000 [IU] | Freq: Once | INTRAVENOUS | Status: AC
  Administered 2023-05-21: 500 [IU]
  Filled 2023-05-21: qty 5

## 2023-05-21 MED ORDER — SODIUM CHLORIDE 0.9% FLUSH
10.0000 mL | Freq: Once | INTRAVENOUS | Status: AC
  Administered 2023-05-21: 10 mL via INTRAVENOUS
  Filled 2023-05-21: qty 10

## 2023-05-21 MED ORDER — MAGNESIUM SULFATE 2 GM/50ML IV SOLN
2.0000 g | Freq: Once | INTRAVENOUS | Status: AC
  Administered 2023-05-21: 2 g via INTRAVENOUS
  Filled 2023-05-21: qty 50

## 2023-05-21 MED ORDER — MAGNESIUM SULFATE 4 GM/100ML IV SOLN
4.0000 g | Freq: Once | INTRAVENOUS | Status: AC
  Administered 2023-05-21: 4 g via INTRAVENOUS
  Filled 2023-05-21: qty 100

## 2023-05-21 MED ORDER — SODIUM CHLORIDE 0.9 % IV SOLN
25.0000 mg | Freq: Once | INTRAVENOUS | Status: AC
  Administered 2023-05-21: 25 mg via INTRAVENOUS
  Filled 2023-05-21: qty 1

## 2023-05-21 NOTE — Patient Instructions (Signed)
Hypomagnesemia Hypomagnesemia is a condition in which the level of magnesium in the blood is too low. Magnesium is a mineral that is found in many foods. It is used in many different processes in the body. Hypomagnesemia can affect every organ in the body. In severe cases, it can cause life-threatening problems. What are the causes? This condition may be caused by: Not getting enough magnesium in your diet or not having enough healthy foods to eat (malnutrition). Problems with magnesium absorption in the intestines. Dehydration. Excessive use of alcohol. Vomiting. Severe or long-term (chronic) diarrhea. Some medicines, including medicines that make you urinate more often (diuretics). Certain diseases, such as kidney disease, diabetes, celiac disease, and overactive thyroid. What are the signs or symptoms? Symptoms of this condition include: Loss of appetite, nausea, and vomiting. Involuntary shaking or trembling of a body part (tremor). Muscle weakness or tingling in the arms and legs. Sudden tightening of muscles (muscle spasms). Confusion. Psychiatric issues, such as: Depression and irritability. Psychosis. A feeling of fluttering of the heart (palpitations). Seizures. These symptoms are more severe if magnesium levels drop suddenly. How is this diagnosed? This condition may be diagnosed based on: Your symptoms and medical history. A physical exam. Blood and urine tests. How is this treated? Treatment depends on the cause and the severity of the condition. It may be treated by: Taking a magnesium supplement. This can be taken in pill form. If the condition is severe, magnesium is usually given through an IV. Making changes to your diet. You may be directed to eat foods that have a lot of magnesium, such as green leafy vegetables, peas, beans, and nuts. Not drinking alcohol. If you are struggling not to drink, ask your health care provider for help. Follow these instructions at  home: Eating and drinking     Make sure that your diet includes foods with magnesium. Foods that have a lot of magnesium in them include: Green leafy vegetables, such as spinach and broccoli. Beans and peas. Nuts and seeds, such as almonds and sunflower seeds. Whole grains, such as whole grain bread and fortified cereals. Drink fluids that contain salts and minerals (electrolytes), such as sports drinks, when you are active. Do not drink alcohol. General instructions Take over-the-counter and prescription medicines only as told by your health care provider. Take magnesium supplements as directed if your health care provider tells you to take them. Have your magnesium levels monitored as told by your health care provider. Keep all follow-up visits. This is important. Contact a health care provider if: You get worse instead of better. Your symptoms return. Get help right away if: You develop severe muscle weakness. You have trouble breathing. You feel that your heart is racing. These symptoms may represent a serious problem that is an emergency. Do not wait to see if the symptoms will go away. Get medical help right away. Call your local emergency services (911 in the [place] Do not drive yourself to the hospital. Summary Hypomagnesemia is a condition in which the level of magnesium in the blood is too low. Hypomagnesemia can affect every organ in the body. Treatment may include eating more foods that contain magnesium, taking magnesium supplements, and not drinking alcohol. Have your magnesium levels monitored as told by your health care provider. This information is not intended to replace advice given to you by your health care provider. Make sure you discuss any questions you have with your health care provider. Document Revised: 11/14/2020 Document Reviewed: 11/14/2020 [employer] Patient Education    2024 [employer]  

## 2023-05-22 ENCOUNTER — Inpatient Hospital Stay: Payer: Medicare PPO

## 2023-05-22 DIAGNOSIS — N1831 Chronic kidney disease, stage 3a: Secondary | ICD-10-CM | POA: Diagnosis not present

## 2023-05-22 DIAGNOSIS — E1122 Type 2 diabetes mellitus with diabetic chronic kidney disease: Secondary | ICD-10-CM | POA: Diagnosis not present

## 2023-05-22 DIAGNOSIS — D631 Anemia in chronic kidney disease: Secondary | ICD-10-CM | POA: Diagnosis not present

## 2023-05-22 DIAGNOSIS — I10 Essential (primary) hypertension: Secondary | ICD-10-CM | POA: Diagnosis not present

## 2023-05-26 DIAGNOSIS — M9901 Segmental and somatic dysfunction of cervical region: Secondary | ICD-10-CM | POA: Diagnosis not present

## 2023-05-26 DIAGNOSIS — M5412 Radiculopathy, cervical region: Secondary | ICD-10-CM | POA: Diagnosis not present

## 2023-05-26 DIAGNOSIS — M62830 Muscle spasm of back: Secondary | ICD-10-CM | POA: Diagnosis not present

## 2023-05-26 DIAGNOSIS — M9902 Segmental and somatic dysfunction of thoracic region: Secondary | ICD-10-CM | POA: Diagnosis not present

## 2023-05-27 ENCOUNTER — Inpatient Hospital Stay: Payer: Medicare PPO

## 2023-05-27 VITALS — BP 114/42 | HR 62 | Temp 96.2°F | Resp 20

## 2023-05-27 DIAGNOSIS — G629 Polyneuropathy, unspecified: Secondary | ICD-10-CM | POA: Diagnosis not present

## 2023-05-27 DIAGNOSIS — Z800 Family history of malignant neoplasm of digestive organs: Secondary | ICD-10-CM | POA: Diagnosis not present

## 2023-05-27 DIAGNOSIS — C9001 Multiple myeloma in remission: Secondary | ICD-10-CM

## 2023-05-27 DIAGNOSIS — E538 Deficiency of other specified B group vitamins: Secondary | ICD-10-CM

## 2023-05-27 DIAGNOSIS — R6884 Jaw pain: Secondary | ICD-10-CM | POA: Diagnosis not present

## 2023-05-27 DIAGNOSIS — Z833 Family history of diabetes mellitus: Secondary | ICD-10-CM | POA: Diagnosis not present

## 2023-05-27 DIAGNOSIS — Z79899 Other long term (current) drug therapy: Secondary | ICD-10-CM | POA: Diagnosis not present

## 2023-05-27 DIAGNOSIS — Z808 Family history of malignant neoplasm of other organs or systems: Secondary | ICD-10-CM | POA: Diagnosis not present

## 2023-05-27 LAB — MAGNESIUM: Magnesium: 1.5 mg/dL — ABNORMAL LOW (ref 1.7–2.4)

## 2023-05-27 MED ORDER — SODIUM CHLORIDE 0.9% FLUSH
10.0000 mL | Freq: Once | INTRAVENOUS | Status: AC | PRN
  Administered 2023-05-27: 10 mL
  Filled 2023-05-27: qty 10

## 2023-05-27 MED ORDER — SODIUM CHLORIDE 0.9 % IV SOLN
INTRAVENOUS | Status: DC
  Filled 2023-05-27: qty 250

## 2023-05-27 MED ORDER — SODIUM CHLORIDE 0.9 % IV SOLN
25.0000 mg | Freq: Once | INTRAVENOUS | Status: AC
  Administered 2023-05-27: 25 mg via INTRAVENOUS
  Filled 2023-05-27: qty 1

## 2023-05-27 MED ORDER — HEPARIN SOD (PORK) LOCK FLUSH 100 UNIT/ML IV SOLN
500.0000 [IU] | Freq: Once | INTRAVENOUS | Status: AC | PRN
Start: 2023-05-27 — End: 2023-05-27
  Administered 2023-05-27: 500 [IU]
  Filled 2023-05-27: qty 5

## 2023-05-27 MED ORDER — MAGNESIUM SULFATE 4 GM/100ML IV SOLN
4.0000 g | Freq: Once | INTRAVENOUS | Status: AC
  Administered 2023-05-27: 4 g via INTRAVENOUS
  Filled 2023-05-27: qty 100

## 2023-05-28 ENCOUNTER — Inpatient Hospital Stay: Payer: Medicare PPO

## 2023-05-28 DIAGNOSIS — M87180 Osteonecrosis due to drugs, jaw: Secondary | ICD-10-CM | POA: Diagnosis not present

## 2023-05-28 DIAGNOSIS — M9901 Segmental and somatic dysfunction of cervical region: Secondary | ICD-10-CM | POA: Diagnosis not present

## 2023-05-28 DIAGNOSIS — I5022 Chronic systolic (congestive) heart failure: Secondary | ICD-10-CM | POA: Diagnosis not present

## 2023-05-28 DIAGNOSIS — M5412 Radiculopathy, cervical region: Secondary | ICD-10-CM | POA: Diagnosis not present

## 2023-05-28 DIAGNOSIS — M62830 Muscle spasm of back: Secondary | ICD-10-CM | POA: Diagnosis not present

## 2023-05-28 DIAGNOSIS — Z9481 Bone marrow transplant status: Secondary | ICD-10-CM | POA: Diagnosis not present

## 2023-05-28 DIAGNOSIS — M9902 Segmental and somatic dysfunction of thoracic region: Secondary | ICD-10-CM | POA: Diagnosis not present

## 2023-05-28 DIAGNOSIS — T458X5A Adverse effect of other primarily systemic and hematological agents, initial encounter: Secondary | ICD-10-CM | POA: Diagnosis not present

## 2023-05-28 DIAGNOSIS — D801 Nonfamilial hypogammaglobulinemia: Secondary | ICD-10-CM | POA: Diagnosis not present

## 2023-05-28 DIAGNOSIS — C9002 Multiple myeloma in relapse: Secondary | ICD-10-CM | POA: Diagnosis not present

## 2023-06-02 ENCOUNTER — Ambulatory Visit (INDEPENDENT_AMBULATORY_CARE_PROVIDER_SITE_OTHER): Payer: Medicare PPO | Admitting: Internal Medicine

## 2023-06-02 ENCOUNTER — Encounter: Payer: Self-pay | Admitting: Internal Medicine

## 2023-06-02 VITALS — BP 112/70 | HR 62 | Ht 63.0 in | Wt 163.0 lb

## 2023-06-02 DIAGNOSIS — E119 Type 2 diabetes mellitus without complications: Secondary | ICD-10-CM | POA: Diagnosis not present

## 2023-06-02 DIAGNOSIS — F5101 Primary insomnia: Secondary | ICD-10-CM | POA: Diagnosis not present

## 2023-06-02 DIAGNOSIS — I10 Essential (primary) hypertension: Secondary | ICD-10-CM

## 2023-06-02 DIAGNOSIS — I5022 Chronic systolic (congestive) heart failure: Secondary | ICD-10-CM | POA: Diagnosis not present

## 2023-06-02 DIAGNOSIS — M47816 Spondylosis without myelopathy or radiculopathy, lumbar region: Secondary | ICD-10-CM | POA: Diagnosis not present

## 2023-06-02 DIAGNOSIS — F324 Major depressive disorder, single episode, in partial remission: Secondary | ICD-10-CM | POA: Diagnosis not present

## 2023-06-02 DIAGNOSIS — D696 Thrombocytopenia, unspecified: Secondary | ICD-10-CM | POA: Diagnosis not present

## 2023-06-02 DIAGNOSIS — Z9484 Stem cells transplant status: Secondary | ICD-10-CM | POA: Diagnosis not present

## 2023-06-02 DIAGNOSIS — Z7984 Long term (current) use of oral hypoglycemic drugs: Secondary | ICD-10-CM | POA: Diagnosis not present

## 2023-06-02 LAB — POCT GLYCOSYLATED HEMOGLOBIN (HGB A1C): Hemoglobin A1C: 6.2 % — AB (ref 4.0–5.6)

## 2023-06-02 MED ORDER — BISOPROLOL FUMARATE 10 MG PO TABS: 10.0000 mg | ORAL_TABLET | Freq: Every day | ORAL | 1 refills | Status: AC

## 2023-06-02 MED ORDER — DIAZEPAM 5 MG PO TABS: 2.5000 mg | ORAL_TABLET | Freq: Every evening | ORAL | 3 refills | Status: DC | PRN

## 2023-06-02 MED ORDER — TIZANIDINE HCL 4 MG PO TABS: 4.0000 mg | ORAL_TABLET | Freq: Four times a day (QID) | ORAL | 1 refills | Status: DC | PRN

## 2023-06-02 MED ORDER — FLUOXETINE HCL 40 MG PO CAPS: 40.0000 mg | ORAL_CAPSULE | Freq: Every day | ORAL | 0 refills | Status: DC

## 2023-06-02 NOTE — Assessment & Plan Note (Addendum)
Blood sugars stable without hypoglycemic symptoms or events. Currently managed with glipizide. Changes made last visit are none. Lab Results  Component Value Date   HGBA1C 6.5 (H) 12/11/2022  A1C today = 6.2

## 2023-06-02 NOTE — Assessment & Plan Note (Signed)
>>  ASSESSMENT AND PLAN FOR DIABETES MELLITUS TREATED WITH ORAL MEDICATION (HCC) WRITTEN ON 06/02/2023  4:59 PM BY Aveya Beal H, MD  Blood sugars stable without hypoglycemic symptoms or events. Currently managed with glipizide . Changes made last visit are none. Lab Results  Component Value Date   HGBA1C 6.5 (H) 12/11/2022  A1C today = 6.2

## 2023-06-02 NOTE — Assessment & Plan Note (Addendum)
Controlled BP with normal exam. Current regimen is bisoprolol, losartan, aldactone and lasix. Will continue same medications; encourage continued reduced sodium diet.

## 2023-06-02 NOTE — Assessment & Plan Note (Addendum)
Clinically stable on Prozac with fair response, No SI or HI reported. She would like a higher dose and maybe something else. Will increase dose to 40 mg and re-evaluate in one month

## 2023-06-02 NOTE — Assessment & Plan Note (Signed)
Being followed closely by Oncology

## 2023-06-02 NOTE — Assessment & Plan Note (Signed)
Unable to tolerate Entresto - changed to losartan alone.

## 2023-06-02 NOTE — Progress Notes (Signed)
Date:  06/02/2023   Name:  Sarah [name]   DOB:  [birth year]   MRN:  [id number]   Chief Complaint: Depression and Insomnia  Depression        This is a chronic problem.  The problem occurs constantly.  The problem has been gradually worsening since onset.  Associated symptoms include decreased concentration, insomnia, decreased interest and sad.  Associated symptoms include no fatigue and no headaches.  Past treatments include SSRIs - Selective serotonin reuptake inhibitors.  Compliance with treatment is good. Insomnia The problem occurs nightly. PMH includes: depression.   Hypertension This is a chronic problem. The problem is controlled. Pertinent negatives include no chest pain, headaches, palpitations or shortness of breath. Past treatments include angiotensin blockers, diuretics and beta blockers. There are no compliance problems.  Hypertensive end-organ damage includes CAD/MI. There is no history of kidney disease or CVA.  Diabetes She presents for her follow-up diabetic visit. She has type 2 diabetes mellitus. Her disease course has been stable. Pertinent negatives for hypoglycemia include no dizziness or headaches. Pertinent negatives for diabetes include no chest pain, no fatigue and no weakness. Pertinent negatives for diabetic complications include no CVA. Current diabetic treatment includes oral agent (monotherapy) (glipizide).    Review of Systems  Constitutional:  Negative for fatigue and unexpected weight change.  HENT:  Negative for nosebleeds.   Eyes:  Negative for visual disturbance.  Respiratory:  Negative for cough, chest tightness, shortness of breath and wheezing.   Cardiovascular:  Negative for chest pain, palpitations and leg swelling.  Gastrointestinal:  Negative for abdominal pain, constipation and diarrhea.  Neurological:  Negative for dizziness, weakness, light-headedness and headaches.  Psychiatric/Behavioral:  Positive for decreased concentration and  depression. The patient has insomnia.      Lab Results  Component Value Date   NA 137 04/24/2023   K 4.5 04/24/2023   CO2 22 04/24/2023   GLUCOSE 136 (H) 04/24/2023   BUN 27 (H) 04/24/2023   CREATININE 1.25 (H) 04/24/2023   CALCIUM 9.3 04/24/2023   GFRNONAA 48 (L) 04/24/2023   Lab Results  Component Value Date   CHOL 164 12/11/2022   HDL 67 12/11/2022   LDLCALC 83 12/11/2022   TRIG 68 12/11/2022   CHOLHDL 2.4 12/11/2022   Lab Results  Component Value Date   TSH 1.450 07/22/2022   Lab Results  Component Value Date   HGBA1C 6.2 (A) 06/02/2023   Lab Results  Component Value Date   WBC 4.9 04/24/2023   HGB 11.2 (L) 04/24/2023   HCT 33.0 (L) 04/24/2023   MCV 85.3 04/24/2023   PLT 127 (L) 04/24/2023   Lab Results  Component Value Date   ALT 28 04/24/2023   AST 28 04/24/2023   ALKPHOS 75 04/24/2023   BILITOT 0.5 04/24/2023   Lab Results  Component Value Date   VD25OH 25.64 (L) 12/11/2022     Patient Active Problem List   Diagnosis Date Noted   Jaw pain 03/13/2023   Prolonged QT interval 03/07/2023   Vitamin D deficiency 12/09/2022   Diabetes mellitus treated with oral medication (HCC) 12/09/2022   NICM (nonischemic cardiomyopathy) (HCC) 08/05/2022   Stage 3b chronic kidney disease (CKD) (HCC) 04/09/2022   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   Overweight (BMI 25.0-29.9) 02/04/2022   Complete left bundle branch block 09/06/2021   Anemia of chronic renal failure, stage 3a (HCC) 09/06/2021   Gastroesophageal reflux disease without esophagitis 09/06/2021   Chronic systolic CHF (congestive heart failure) (HCC) 08/22/2021  Low immunoglobulin level    Multiple myeloma in remission (HCC) 05/14/2021   Neuropathy 04/02/2021   Frequent PVCs 12/04/2020   Coronary artery disease involving native coronary artery of native heart 04/26/2020   Hypokalemia 03/11/2020   Physical deconditioning 02/07/2020   Impaired nutrition 02/07/2020   S/P autologous bone marrow  transplantation (HCC) 09/10/2019   Hypocalcemia 08/28/2019   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   Hyperlipidemia associated with type 2 diabetes mellitus (HCC) 11/25/2018   Hypomagnesemia 08/20/2018   Goals of care, counseling/discussion 08/20/2018   B12 deficiency 08/20/2018   Thrombocytopenia (HCC) 02/09/2018   Benign essential HTN 08/21/2017   Carotid artery stenosis, asymptomatic, bilateral 08/06/2017   Thyroid nodule 08/06/2017   Iron deficiency anemia due to chronic blood loss 05/13/2017   Spinal stenosis of lumbar region with neurogenic claudication 04/09/2017   Long term prescription opiate use 09/07/2016   Pain medication agreement signed 07/08/2016   Spondylosis of lumbar region without myelopathy or radiculopathy 07/08/2016   Nonrheumatic aortic valve stenosis 06/05/2016   Environmental and seasonal allergies 04/19/2016   Insomnia 04/19/2016   Asthma 04/19/2016   Aortic regurgitation 04/19/2016   Type II diabetes mellitus with complication (HCC) 04/12/2016   Depression, major, single episode, in partial remission (HCC) 04/12/2016   Irritable bowel syndrome (IBS) 04/12/2016   Hepatic steatosis 04/12/2016   Multiple myeloma in remission (HCC) 04/02/2016   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HCC) 07/06/2015   Stem cell transplant candidate 05/16/2015    Allergies  Allergen Reactions   Oxycodone-Acetaminophen Anaphylaxis    Swelling and rash   Celebrex [Celecoxib] Diarrhea   Codeine    Plerixafor     In 2016 during ASCT collection patient developed fever to 103.40F and required hospitalization   Benadryl [Diphenhydramine] Palpitations   Morphine Itching and Rash   Ondansetron Diarrhea   Tylenol [Acetaminophen] Itching and Rash    Past Surgical History:  Procedure Laterality Date   ABDOMINAL HYSTERECTOMY     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   CARDIAC ELECTROPHYSIOLOGY MAPPING AND ABLATION     CARPAL TUNNEL RELEASE Bilateral    CHOLECYSTECTOMY   2008   COLONOSCOPY WITH PROPOFOL N/A 05/07/2017   Procedure: COLONOSCOPY WITH PROPOFOL;  Surgeon: Wyline Mood, MD;  Location: [facility] ENDOSCOPY;  Service: Gastroenterology;  Laterality: N/A;   COLONOSCOPY WITH PROPOFOL N/A 12/19/2020   Procedure: COLONOSCOPY WITH PROPOFOL;  Surgeon: Wyline Mood, MD;  Location: [facility] ENDOSCOPY;  Service: Gastroenterology;  Laterality: N/A;   ESOPHAGOGASTRODUODENOSCOPY (EGD) WITH PROPOFOL N/A 05/07/2017   Procedure: ESOPHAGOGASTRODUODENOSCOPY (EGD) WITH PROPOFOL;  Surgeon: Wyline Mood, MD;  Location: [facility] ENDOSCOPY;  Service: Gastroenterology;  Laterality: N/A;   FOOT SURGERY Bilateral    INCONTINENCE SURGERY  2009   INTERSTIM IMPLANT PLACEMENT     other     over active bladder   OTHER SURGICAL HISTORY     bladder stimulator    PARTIAL HYSTERECTOMY  03/1996   fibroids   PORTA CATH INSERTION N/A 03/10/2019   Procedure: PORTA CATH INSERTION;  Surgeon: Annice Needy, MD;  Location: [facility] INVASIVE CV LAB;  Service: Cardiovascular;  Laterality: N/A;   TONSILLECTOMY  2007    Social History   Tobacco Use   Smoking status: Former    Current packs/day: 0.00    Average packs/day: 1 pack/day for 20.0 years (20.0 ttl pk-yrs)    Types: Cigarettes    Start date: 07/02/1971    Quit date: 07/02/1991    Years since quitting: 31.9   Smokeless  tobacco: Never  Vaping Use   Vaping status: Never Used  Substance Use Topics   Alcohol use: Yes    Alcohol/week: 2.0 standard drinks of alcohol    Types: 2 Cans of beer per week   Drug use: No     Medication list has been reviewed and updated.  Current Meds  Medication Sig   allopurinol (ZYLOPRIM) 100 MG tablet TAKE 1 TABLET(100 MG) BY MOUTH DAILY as needed   amiodarone (PACERONE) 200 MG tablet Take 200 mg by mouth daily.   calcium carbonate (OS-CAL) 1250 (500 Ca) MG chewable tablet Chew 2 tablets by mouth daily.   diclofenac sodium (VOLTAREN) 1 % GEL Apply 2 g topically 4 (four) times daily.   diphenoxylate-atropine (LOMOTIL)  2.5-0.025 MG tablet Take 2 tablets by mouth 4 (four) times daily as needed for diarrhea or loose stools.   furosemide (LASIX) 40 MG tablet Take 0.5 tablets (20 mg total) by mouth daily. Home dose.   glipiZIDE (GLUCOTROL XL) 2.5 MG 24 hr tablet Take 1 tablet (2.5 mg total) by mouth daily with breakfast.   glucose blood test strip    lidocaine (XYLOCAINE) 2 % solution Use as directed 15 mLs in the mouth or throat every 3 (three) hours as needed for mouth pain (swish and spit).   losartan (COZAAR) 25 MG tablet Take 1 tablet by mouth daily.   magnesium chloride (SLOW-MAG) 64 MG TBEC SR tablet Take 1 tablet (64 mg total) by mouth daily.   Multiple Vitamin (MULTIVITAMIN) tablet Take 1 tablet by mouth daily.   omeprazole (PRILOSEC) 40 MG capsule TAKE 1 CAPSULE IN THE MORNING AND TAKE 1 CAPSULE AT BEDTIME   pentoxifylline (TRENTAL) 400 MG CR tablet Take 400 mg by mouth 3 (three) times daily with meals.   potassium chloride SA (KLOR-CON M) 20 MEQ tablet Take 1 tablet (20 mEq total) by mouth daily.   promethazine (PHENERGAN) 25 MG tablet Take 1 tablet (25 mg total) by mouth every 6 (six) hours as needed for nausea or vomiting.   spironolactone (ALDACTONE) 25 MG tablet Take by mouth.   traMADol (ULTRAM) 50 MG tablet    traZODone (DESYREL) 100 MG tablet TAKE 1 TABLET AT BEDTIME   vitamin E 45 MG (100 UNITS) capsule Take by mouth daily.   [DISCONTINUED] bisoprolol (ZEBETA) 10 MG tablet Take 1 tablet (10 mg total) by mouth daily.   [DISCONTINUED] diazepam (VALIUM) 5 MG tablet Take 0.5-1 tablets (2.5-5 mg total) by mouth at bedtime as needed (insomnia).   [DISCONTINUED] FLUoxetine (PROZAC) 20 MG capsule TAKE 1 CAPSULE(20 MG) BY MOUTH DAILY   [DISCONTINUED] tiZANidine (ZANAFLEX) 4 MG tablet TAKE 1 TABLET(4 MG) BY MOUTH EVERY 6 HOURS AS NEEDED FOR MUSCLE SPASMS       06/02/2023    3:38 PM 12/09/2022    9:50 AM 10/17/2022    2:13 PM 12/07/2021    9:32 AM  GAD 7 : Generalized Anxiety Score  Nervous, Anxious, on  Edge 3 1 1 1   Control/stop worrying 3 2 0 0  Worry too much - different things 3 2 0 0  Trouble relaxing 3 2 0 1  Restless 3 1 0 1  Easily annoyed or irritable 1 1 1  0  Afraid - awful might happen 0 0 0 0  Total GAD 7 Score 16 9 2 3   Anxiety Difficulty Somewhat difficult Somewhat difficult Not difficult at all Not difficult at all       06/02/2023    3:38 PM 03/11/2023  8:25 AM 12/09/2022    9:50 AM  Depression screen PHQ 2/9  Decreased Interest 1 1 1   Down, Depressed, Hopeless 1 1 0  PHQ - 2 Score 2 2 1   Altered sleeping 3 0 3  Tired, decreased energy 3 0 2  Change in appetite 0 0 1  Feeling bad or failure about yourself  0 0 0  Trouble concentrating 0 0 1  Moving slowly or fidgety/restless 0 0 0  Suicidal thoughts 0 0 0  PHQ-9 Score 8 2 8   Difficult doing work/chores Somewhat difficult Not difficult at all Not difficult at all    BP Readings from Last 3 Encounters:  06/02/23 112/70  05/27/23 (!) 114/42  05/21/23 (!) 147/75    Physical Exam Vitals and nursing note reviewed.  Constitutional:      General: She is not in acute distress.    Appearance: Normal appearance. She is well-developed.  HENT:     Head: Normocephalic and atraumatic.  Neck:     Vascular: No carotid bruit.  Cardiovascular:     Rate and Rhythm: Normal rate. Rhythm irregular.     Pulses: Normal pulses.     Heart sounds: Murmur heard.     Systolic murmur is present with a grade of 2/6.  Pulmonary:     Effort: Pulmonary effort is normal. No respiratory distress.     Breath sounds: No wheezing or rhonchi.  Musculoskeletal:     Cervical back: Normal range of motion.     Right lower leg: No edema.     Left lower leg: No edema.  Lymphadenopathy:     Cervical: No cervical adenopathy.  Skin:    General: Skin is warm and dry.     Findings: No rash.  Neurological:     General: No focal deficit present.     Mental Status: She is alert and oriented to person, place, and time.  Psychiatric:         Mood and Affect: Mood normal.        Behavior: Behavior normal.     Wt Readings from Last 3 Encounters:  06/02/23 163 lb (73.9 kg)  04/24/23 160 lb 4.8 oz (72.7 kg)  04/14/23 157 lb 12.8 oz (71.6 kg)    BP 112/70   Pulse 62   Ht 5\' 3"  (1.6 m)   Wt 163 lb (73.9 kg)   SpO2 97%   BMI 28.87 kg/m   Assessment and Plan:  Problem List Items Addressed This Visit       Unprioritized   Benign essential HTN (Chronic)    Controlled BP with normal exam. Current regimen is bisoprolol, losartan, aldactone and lasix. Will continue same medications; encourage continued reduced sodium diet.       Relevant Medications   bisoprolol (ZEBETA) 10 MG tablet   Chronic systolic CHF (congestive heart failure) (HCC)    Unable to tolerate Entresto - changed to losartan alone.      Relevant Medications   bisoprolol (ZEBETA) 10 MG tablet   Depression, major, single episode, in partial remission (HCC)    Clinically stable on Prozac with fair response, No SI or HI reported. She would like a higher dose and maybe something else. Will increase dose to 40 mg and re-evaluate in one month        Relevant Medications   diazepam (VALIUM) 5 MG tablet   FLUoxetine (PROZAC) 40 MG capsule   Diabetes mellitus treated with oral medication (HCC) - Primary  Blood sugars stable without hypoglycemic symptoms or events. Currently managed with glipizide. Changes made last visit are none. Lab Results  Component Value Date   HGBA1C 6.5 (H) 12/11/2022  A1C today = 6.2      Relevant Orders   POCT glycosylated hemoglobin (Hb A1C) (Completed)   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HCC)    Being followed closely by Oncology      Insomnia   Relevant Medications   diazepam (VALIUM) 5 MG tablet   Spondylosis of lumbar region without myelopathy or radiculopathy   Relevant Medications   tiZANidine (ZANAFLEX) 4 MG tablet   Other Visit Diagnoses     Thrombocytopenia, unspecified (HCC)   (Chronic)          No follow-ups on file.    [name], MD [facility] [place]

## 2023-06-03 ENCOUNTER — Other Ambulatory Visit: Payer: Self-pay

## 2023-06-04 ENCOUNTER — Other Ambulatory Visit: Payer: Self-pay

## 2023-06-04 DIAGNOSIS — C9001 Multiple myeloma in remission: Secondary | ICD-10-CM

## 2023-06-05 ENCOUNTER — Inpatient Hospital Stay: Payer: Medicare PPO | Attending: Oncology

## 2023-06-05 ENCOUNTER — Ambulatory Visit
Admission: RE | Admit: 2023-06-05 | Discharge: 2023-06-05 | Disposition: A | Discharge status: Home / Self Care | Payer: Medicare PPO | Source: Ambulatory Visit | Attending: Oncology | Admitting: Oncology

## 2023-06-05 ENCOUNTER — Encounter: Payer: Self-pay | Admitting: Oncology

## 2023-06-05 ENCOUNTER — Telehealth: Payer: Self-pay

## 2023-06-05 ENCOUNTER — Other Ambulatory Visit: Payer: Self-pay

## 2023-06-05 ENCOUNTER — Inpatient Hospital Stay (HOSPITAL_BASED_OUTPATIENT_CLINIC_OR_DEPARTMENT_OTHER): Payer: Medicare PPO | Admitting: Oncology

## 2023-06-05 ENCOUNTER — Inpatient Hospital Stay: Payer: Medicare PPO

## 2023-06-05 VITALS — Temp 98.8°F

## 2023-06-05 VITALS — BP 121/56 | HR 58 | Temp 96.6°F | Wt 164.3 lb

## 2023-06-05 DIAGNOSIS — D500 Iron deficiency anemia secondary to blood loss (chronic): Secondary | ICD-10-CM

## 2023-06-05 DIAGNOSIS — C9000 Multiple myeloma not having achieved remission: Secondary | ICD-10-CM

## 2023-06-05 DIAGNOSIS — Z885 Allergy status to narcotic agent status: Secondary | ICD-10-CM | POA: Insufficient documentation

## 2023-06-05 DIAGNOSIS — Z90710 Acquired absence of both cervix and uterus: Secondary | ICD-10-CM | POA: Insufficient documentation

## 2023-06-05 DIAGNOSIS — R0981 Nasal congestion: Secondary | ICD-10-CM | POA: Diagnosis not present

## 2023-06-05 DIAGNOSIS — E1122 Type 2 diabetes mellitus with diabetic chronic kidney disease: Secondary | ICD-10-CM | POA: Diagnosis not present

## 2023-06-05 DIAGNOSIS — Z886 Allergy status to analgesic agent status: Secondary | ICD-10-CM | POA: Diagnosis not present

## 2023-06-05 DIAGNOSIS — C9001 Multiple myeloma in remission: Secondary | ICD-10-CM

## 2023-06-05 DIAGNOSIS — Z9481 Bone marrow transplant status: Secondary | ICD-10-CM | POA: Diagnosis not present

## 2023-06-05 DIAGNOSIS — Z833 Family history of diabetes mellitus: Secondary | ICD-10-CM | POA: Insufficient documentation

## 2023-06-05 DIAGNOSIS — Z9221 Personal history of antineoplastic chemotherapy: Secondary | ICD-10-CM | POA: Insufficient documentation

## 2023-06-05 DIAGNOSIS — I130 Hypertensive heart and chronic kidney disease with heart failure and stage 1 through stage 4 chronic kidney disease, or unspecified chronic kidney disease: Secondary | ICD-10-CM | POA: Diagnosis not present

## 2023-06-05 DIAGNOSIS — Z9484 Stem cells transplant status: Secondary | ICD-10-CM

## 2023-06-05 DIAGNOSIS — I350 Nonrheumatic aortic (valve) stenosis: Secondary | ICD-10-CM | POA: Insufficient documentation

## 2023-06-05 DIAGNOSIS — Z79899 Other long term (current) drug therapy: Secondary | ICD-10-CM | POA: Diagnosis not present

## 2023-06-05 DIAGNOSIS — Z800 Family history of malignant neoplasm of digestive organs: Secondary | ICD-10-CM | POA: Diagnosis not present

## 2023-06-05 DIAGNOSIS — N1832 Chronic kidney disease, stage 3b: Secondary | ICD-10-CM | POA: Insufficient documentation

## 2023-06-05 DIAGNOSIS — R059 Cough, unspecified: Secondary | ICD-10-CM | POA: Diagnosis not present

## 2023-06-05 DIAGNOSIS — Z822 Family history of deafness and hearing loss: Secondary | ICD-10-CM | POA: Insufficient documentation

## 2023-06-05 DIAGNOSIS — Z9049 Acquired absence of other specified parts of digestive tract: Secondary | ICD-10-CM | POA: Insufficient documentation

## 2023-06-05 DIAGNOSIS — Z87891 Personal history of nicotine dependence: Secondary | ICD-10-CM | POA: Insufficient documentation

## 2023-06-05 DIAGNOSIS — Z803 Family history of malignant neoplasm of breast: Secondary | ICD-10-CM | POA: Insufficient documentation

## 2023-06-05 DIAGNOSIS — Z801 Family history of malignant neoplasm of trachea, bronchus and lung: Secondary | ICD-10-CM | POA: Insufficient documentation

## 2023-06-05 DIAGNOSIS — J069 Acute upper respiratory infection, unspecified: Secondary | ICD-10-CM

## 2023-06-05 DIAGNOSIS — I5032 Chronic diastolic (congestive) heart failure: Secondary | ICD-10-CM | POA: Insufficient documentation

## 2023-06-05 DIAGNOSIS — R0602 Shortness of breath: Secondary | ICD-10-CM | POA: Diagnosis not present

## 2023-06-05 DIAGNOSIS — Z888 Allergy status to other drugs, medicaments and biological substances status: Secondary | ICD-10-CM | POA: Insufficient documentation

## 2023-06-05 DIAGNOSIS — E538 Deficiency of other specified B group vitamins: Secondary | ICD-10-CM | POA: Diagnosis not present

## 2023-06-05 DIAGNOSIS — R918 Other nonspecific abnormal finding of lung field: Secondary | ICD-10-CM | POA: Diagnosis not present

## 2023-06-05 DIAGNOSIS — Z95828 Presence of other vascular implants and grafts: Secondary | ICD-10-CM

## 2023-06-05 DIAGNOSIS — K219 Gastro-esophageal reflux disease without esophagitis: Secondary | ICD-10-CM | POA: Insufficient documentation

## 2023-06-05 DIAGNOSIS — R509 Fever, unspecified: Secondary | ICD-10-CM | POA: Diagnosis not present

## 2023-06-05 DIAGNOSIS — I252 Old myocardial infarction: Secondary | ICD-10-CM | POA: Diagnosis not present

## 2023-06-05 DIAGNOSIS — Z8249 Family history of ischemic heart disease and other diseases of the circulatory system: Secondary | ICD-10-CM | POA: Insufficient documentation

## 2023-06-05 DIAGNOSIS — R0989 Other specified symptoms and signs involving the circulatory and respiratory systems: Secondary | ICD-10-CM | POA: Diagnosis not present

## 2023-06-05 LAB — CMP (CANCER CENTER ONLY)
ALT: 23 U/L (ref 0–44)
AST: 22 U/L (ref 15–41)
Albumin: 3.7 g/dL (ref 3.5–5.0)
Alkaline Phosphatase: 100 U/L (ref 38–126)
Anion gap: 9 (ref 5–15)
BUN: 31 mg/dL — ABNORMAL HIGH (ref 8–23)
CO2: 23 mmol/L (ref 22–32)
Calcium: 8.3 mg/dL — ABNORMAL LOW (ref 8.9–10.3)
Chloride: 105 mmol/L (ref 98–111)
Creatinine: 1.37 mg/dL — ABNORMAL HIGH (ref 0.44–1.00)
GFR, Estimated: 43 mL/min — ABNORMAL LOW (ref >60)
Glucose, Bld: 125 mg/dL — ABNORMAL HIGH (ref 70–99)
Potassium: 4.1 mmol/L (ref 3.5–5.1)
Sodium: 137 mmol/L (ref 135–145)
Total Bilirubin: 0.4 mg/dL (ref <1.2)
Total Protein: 6.9 g/dL (ref 6.5–8.1)

## 2023-06-05 LAB — CBC WITH DIFFERENTIAL (CANCER CENTER ONLY)
Abs Immature Granulocytes: 0.04 10*3/uL (ref 0.00–0.07)
Basophils Absolute: 0 10*3/uL (ref 0.0–0.1)
Basophils Relative: 0 %
Eosinophils Absolute: 0.2 10*3/uL (ref 0.0–0.5)
Eosinophils Relative: 2 %
HCT: 28.5 % — ABNORMAL LOW (ref 36.0–46.0)
Hemoglobin: 9.5 g/dL — ABNORMAL LOW (ref 12.0–15.0)
Immature Granulocytes: 1 %
Lymphocytes Relative: 11 %
Lymphs Abs: 0.8 10*3/uL (ref 0.7–4.0)
MCH: 28.2 pg (ref 26.0–34.0)
MCHC: 33.3 g/dL (ref 30.0–36.0)
MCV: 84.6 fL (ref 80.0–100.0)
Monocytes Absolute: 0.6 10*3/uL (ref 0.1–1.0)
Monocytes Relative: 8 %
Neutro Abs: 5.9 10*3/uL (ref 1.7–7.7)
Neutrophils Relative %: 78 %
Platelet Count: 127 10*3/uL — ABNORMAL LOW (ref 150–400)
RBC: 3.37 MIL/uL — ABNORMAL LOW (ref 3.87–5.11)
RDW: 12.9 % (ref 11.5–15.5)
WBC Count: 7.5 10*3/uL (ref 4.0–10.5)
nRBC: 0 % (ref 0.0–0.2)

## 2023-06-05 LAB — RESPIRATORY PANEL BY PCR

## 2023-06-05 LAB — MAGNESIUM: Magnesium: 1.5 mg/dL — ABNORMAL LOW (ref 1.7–2.4)

## 2023-06-05 LAB — IRON AND TIBC
Iron: 24 ug/dL — ABNORMAL LOW (ref 28–170)
Saturation Ratios: 7 % — ABNORMAL LOW (ref 10.4–31.8)
TIBC: 335 ug/dL (ref 250–450)
UIBC: 311 ug/dL

## 2023-06-05 LAB — FERRITIN: Ferritin: 73 ng/mL (ref 11–307)

## 2023-06-05 LAB — RETIC PANEL
Immature Retic Fract: 17.1 % — ABNORMAL HIGH (ref 2.3–15.9)
RBC.: 3.4 MIL/uL — ABNORMAL LOW (ref 3.87–5.11)
Retic Count, Absolute: 62.6 10*3/uL (ref 19.0–186.0)
Retic Ct Pct: 1.8 % (ref 0.4–3.1)
Reticulocyte Hemoglobin: 26.6 pg — ABNORMAL LOW (ref >27.9)

## 2023-06-05 LAB — VITAMIN B12: Vitamin B-12: 487 pg/mL (ref 180–914)

## 2023-06-05 MED ORDER — HEPARIN SOD (PORK) LOCK FLUSH 100 UNIT/ML IV SOLN
500.0000 [IU] | Freq: Once | INTRAVENOUS | Status: AC
Start: 2023-06-05 — End: 2023-06-05
  Administered 2023-06-05: 500 [IU]
  Filled 2023-06-05: qty 5

## 2023-06-05 MED ORDER — CYANOCOBALAMIN 1000 MCG/ML IJ SOLN
1000.0000 ug | Freq: Once | INTRAMUSCULAR | Status: AC
  Administered 2023-06-05: 1000 ug via INTRAMUSCULAR
  Filled 2023-06-05: qty 1

## 2023-06-05 MED ORDER — MAGNESIUM SULFATE 4 GM/100ML IV SOLN
4.0000 g | Freq: Once | INTRAVENOUS | Status: AC
  Administered 2023-06-05: 4 g via INTRAVENOUS
  Filled 2023-06-05: qty 100

## 2023-06-05 MED ORDER — SODIUM CHLORIDE 0.9% FLUSH
10.0000 mL | Freq: Once | INTRAVENOUS | Status: AC
  Administered 2023-06-05: 10 mL via INTRAVENOUS
  Filled 2023-06-05: qty 10

## 2023-06-05 MED ORDER — SODIUM CHLORIDE 0.9 % IV SOLN
4.0000 g | Freq: Once | INTRAVENOUS | Status: DC
Start: 2023-06-05 — End: 2023-06-05

## 2023-06-05 MED ORDER — SODIUM CHLORIDE 0.9 % IV SOLN
25.0000 mg | Freq: Once | INTRAVENOUS | Status: AC
  Administered 2023-06-05: 25 mg via INTRAVENOUS
  Filled 2023-06-05: qty 1

## 2023-06-05 MED ORDER — SODIUM CHLORIDE 0.9 % IV SOLN
INTRAVENOUS | Status: DC
Start: 2023-06-05 — End: 2023-06-05
  Filled 2023-06-05: qty 250

## 2023-06-05 NOTE — Assessment & Plan Note (Signed)
Lab Results  Component Value Date   HGB 9.5 (L) 06/05/2023   TIBC 335 06/05/2023   IRONPCTSAT 7 (L) 06/05/2023   FERRITIN 73 06/05/2023    Recommend IV Venofer weekly x 3

## 2023-06-05 NOTE — Assessment & Plan Note (Signed)
B12 level normal.  continue B12 injections Q 6 weeks.

## 2023-06-05 NOTE — Assessment & Plan Note (Signed)
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  Light chain ratio is normal. 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 Repeat immunofixation negative. . Labs reviewed and discussed with patient, stable M protein and stable light chain ratio Off Daratumumab due to frequent infections  Continue acyclovir prophylaxis.

## 2023-06-05 NOTE — Assessment & Plan Note (Signed)
#  Chronic hypomagnesemia proceed with  IV magnesium today.  Continue weekly magnesium +/- IV magnesium.

## 2023-06-05 NOTE — Assessment & Plan Note (Signed)
-

## 2023-06-05 NOTE — Progress Notes (Signed)
Hematology/Oncology Progress note [phone] [phone]     Chief Complaint: Sarah [name] is a 67 y.o. 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  Light chain ratio is normal. 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 Repeat immunofixation negative. . Labs reviewed and discussed with patient, stable M protein and stable light chain ratio Off Daratumumab due to frequent infections  Continue acyclovir prophylaxis.   B12 deficiency B12 level normal.  continue B12 injections Q 6 weeks.   Hypomagnesemia #Chronic hypomagnesemia proceed with  IV magnesium today.  Continue weekly magnesium +/- IV magnesium.     Iron deficiency anemia due to chronic blood loss Lab Results  Component Value Date   HGB 9.5 (L) 06/05/2023   TIBC 335 06/05/2023   IRONPCTSAT 7 (L) 06/05/2023   FERRITIN 73 06/05/2023    Recommend IV Venofer weekly x 3   History of stem cell transplant (HCC) -[facility]- Post transplant RN coordinator [phone]).    Orders Placed This Encounter  Procedures   Respiratory (~20 pathogens) panel by PCR    Standing Status:   Future    Number of Occurrences:   1    Standing Expiration Date:   06/04/2024   DG Chest 2 View    Standing Status:   Future    Standing Expiration Date:   06/04/2024    Order Specific Question:   Reason for Exam (SYMPTOM  OR DIAGNOSIS REQUIRED)    Answer:   Cough    Order Specific Question:   Preferred imaging location?    Answer:   [facility]   Iron and TIBC    Standing Status:   Future    Number of  Occurrences:   1    Standing Expiration Date:   06/04/2024   Ferritin    Standing Status:   Future    Number of Occurrences:   1    Standing Expiration Date:   06/04/2024   Retic Panel    Standing Status:   Future    Number of Occurrences:   1    Standing Expiration Date:   06/04/2024   CBC with Differential (Cancer Center Only)    Standing Status:   Future    Standing Expiration Date:   06/04/2024   CMP (Cancer Center only)    Standing Status:   Future    Standing Expiration Date:   06/04/2024   Multiple Myeloma Panel (SPEP&IFE w/QIG)    Standing Status:   Future    Standing Expiration Date:   06/04/2024   Kappa/lambda light chains    Standing Status:   Future    Standing Expiration Date:   06/04/2024    Follow up  Lab Mag weekly x5  + IV mag.  6 weeks flex lab MD IV mag,  same labs  All questions were answered. The patient knows to call the clinic with any problems, questions or concerns.  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   Initial bone marrow revealed 80% plasma cells.  Lambda free light chains were 1340.  She had nephrotic range proteinuria.  She initially underwent induction with RVD.  Revlimid maintenance was discontinued on 01/21/2017 secondary to intolerance.     07/19/2019 Bone marrow aspirate and biopsy on  revealed a normocellular marrow with but increased lambda-restricted plasma cells (9% aspirate, 40% CD138  immunohistochemistry).  Findings were consistent with recurrent plasma cell myeloma.  Flow cytometry revealed no monoclonal B-cell or phenotypically aberrant T-cell population. Cytogenetics were 59, XX (normal).  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    Lambda light chains have been followed: 22.2 (ratio 0.56) on 07/03/2017, 30.8 (ratio 0.78) on 09/02/2017, 36.9 (ratio 0.40) on 10/21/2017, 37.4 (ratio 0.41) on 12/16/2017, 70.7(ratio 0.31)  on 02/17/2018, 64.2 (ratio 0.27) on 04/07/2018, 78.9 (ratio 0.18) on 05/26/2018, 128.8 (ratio 0.17) on 08/06/2018, 181.5 (ratio 0.13) on 10/08/2018, 130.9 (ratio 0.13) on 10/20/2018, 160.7 (ratio 0.10) on 12/09/2018, 236.6 (ratio 0.07) on 02/01/2019, 363.6 (ratio 0.04) on 03/22/2019, 404.8 (ratio 0.04) on 04/05/2019, 420.7 (ratio 0.03) on 05/24/2019, 573.4 (ratio 0.03) on 06/23/2019, 451.05 (ratio 0.02) on 08/20/2019, 47.2 (ratio 0.13) on 10/25/2019, 22.4 (ratio 0.21) on 11/25/2019, 16.5 (ratio 0.33) on 01/10/2020, 14.6 (ratio 0.34) on 02/07/2020, 13.1 (ratio 0.31) on 03/07/2020, 10.1 (ratio 0.38) on 04/10/2020, and 9.5 (ratio 0.21) on 06/19/2020.   24 hour UPEP on 06/03/2019 revealed kappa free light chains 95.76, lambda free light chains 1,260.71, and ratio 0.08.  24 hour UPEP on 08/23/2019 revealed total protein of 782 mg/24 hrs with lambda free light chains 1,084.16 mg/L and ratio of 0.10 (1.03-31.76).  M spike in urine was 46.1% (361 mg/24 hrs).    Bone survey on 04/08/2016 and 05/28/2017 revealed no definite lytic lesion seen in the visualized skeleton.  Bone survey on 11/19/2018 revealed no suspicious lucent lesions and no acute bony abnormality.  PET scan on 07/12/2019 revealed no focal metabolic activity to suggest active myeloma within the skeleton. There were no lytic lesions identified on the CT portion of the exam or soft tissue plasmacytomas. There was no evidence of multiple myeloma.    Pretreatment RBC phenotype on 09/23/2019 was positive  for C, e, DUFFY B, KIDD B, M, S, and s antigen; negative for c, E, KELL, DUFFY A, KIDD A, and N antigen.    09/27/2019 - 10/25/2019; 12/09/2019 - 03/13/2020 6 cycles of daratumumab and hyaluronidase-fihj, Pomalyst, and Decadron (DPd) .  Cycle #1 was complicated by fever and neutropenia requiring admission.  Cycle #2 was complicated by pneumonia requiring admission.  Cycle #6 was complicated with an ER evaluation for an elevated lactic acid.   05/10/2020 second autologous stem cell transplant at [facility]   She underwent conditioning with melphalan 140 mg/m2.    She is s/p week #3 Velcade maintenance (began 08/31/2020 - 09/28/2020).  She has no increase in neuropathy.  She has severe aortic stenosis.  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 Cardiology is following for a possible TAVR in the future.  Echo on 07/05/2020 revealed moderate to severe aortic stenosis with  an EF of 50-55%.  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 Zometa was discontinued in 01/2017.  She has chronic nausea on Phenergan.     B12 deficiency.  B12 was 254 on 04/09/2017, 295 on 08/20/2018, and 391 on 10/08/2018.  She was on oral B12.  She received B12 monthly (last 09/14/2020).  Folate was 12.1 on 01/10/2020.    She has iron deficiency.  Ferritin was 32 on 07/01/2019.  She received Venofer on 07/15/2019 and 07/22/2019.   She has hypogammaglobulinemia.  IgG was 245 on 11/25/2019.  She received monthly IVIG (07/22/021 - 03/22/2020).  She received IVIG 400 mg/kg on 01/20/2020, 200 mg/kg on 02/17/2020, and 300 mg/kg on 03/22/2020.  IVIG on 01/20/2020 was complicated by acute renal failure. IgG trough level was 418 on 02/17/2020.  10/15/2019 - 10/20/2019 She was admitted to [facility] with fever and neutropenia.  Cultures were negative.  CXR was negative. She received broad spectrum antibiotics and daily Granix.  She received IVF for acute  renal insufficiency due to diarrhea and dehydration.  Creatine was 1.66 on admission and 1.12 on discharge.    03/01/2020 - 03/05/2020 admitted to [facility] from with fever and neutropenia. CXR revealed no active cardiopulmonary disease. Chest CT with contrast revealed no acute intrathoracic pathology. There were findings which could be suggestive of prior granulomatous disease. She was treated with Cefepime and Vancomycin, then switched to ciprofloxacin on 03/03/2020.   05/08/2020 - 12/05/2021The patient was admitted to [facility] from  for autologous bone marrow transplant.   She received conditioning with melphalan 140 mg/m2.  Bone marrow transplant was on 05/10/2020. The patient developed fevers daily from 05/19/2020 - 05/24/2020. Blood cultures were + for strept sanguis bacteremia.  She was treated cefepime, vancomycin, and solumedrol for possible engraftment syndrome. Cefepime was switched to ceftriaxone; she completed 2 weeks of ceftriaxone on 06/03/2020. She had chemotherapy induced diarrhea.  She experienced urinary retention.  Feb 2022 patient has been on maintenance Velcade 2 mg every 2 weeks.  Chronic diarrhea seen by gastroenterology Dr. Tobi Bastos and was recommended to avoid artificial sugars in her diet including Diet Coke and coffee mate.  She feels some improvement of her diarrhea frequency since the dietary modification.  GI profile PCR negative, C. difficile toxin A+ B negative, fecal calprotectin 77 12/19/2020 colonoscopy showed 2 subcentimeter polyps in the ascending colon, resected and removed.  Pathology showed tubular adenoma.  No malignancy.  Normal mucosa in the entire examined colon.  Biopsied, pathology showed colonic mucosa with no significant pathological alteration.  Negative for active inflammation and features of chronicity.  Negative for microscopic colitis, dysplasia, and malignanc  Diabetes, patient has discontinued metformin due to diarrhea and started on glipizide 2.5  mg   03/05/2021-03/09/2021 Patient was hospitalized due to fever and chills, nonproductive cough, shortness of breath. X-ray showed right lower lobe atelectasis versus pneumonia.  Blood cultures negative.  Urine culture showed 60,000 colonies of Enterococcus facialis.  Patient received antibiotics. Patient also had abdominal pelvis CT scan for work-up of intermittent lower abdomen pain.No acute abnormality.   05/14/21 last dose of Velcade maintenance.  establish care with neurology Dr. Theora Master and her neuropathy regimen has been switched from gabapentin to Lyrica. 05/29/2021 patient got influenza   neuropathy medication has been switched from gabapentin/Lyrica to nortriptyline.  08/18/2021 Hospitalized due to pneumonia from metapnuemovirus, hospitalization was complicated with NSTEMI. She received heparin gtt.  Treated with antibiotics for coverage of pneumonia,  diuretics for pulmonary edema. She  received IVIG 400mg /kg x 1 for hypoglobulinemia. Discharged on 08/25/21 with Augmentin and Zithromax.   May 2023, Daratumumab being held for [facility] infectious disease physician Dr. Kennedy Bucker due to frequent infection.  Diagnosed with CHF [HFrEF] -NYHA class II-III, she follows up with cardiology, started on entresto  hospitalization due to CHF exacerbation, community acquired pneumonia.   Presented to emergency room on 07/01/2022 Respiratory panel RT-PCR negative for COVID-19, influenza and RSV.   INTERVAL HISTORY Sarah [name] is a 67 y.o. female who has above history reviewed by me today presents for follow up visit for management of multiple myeloma.  Patient has been on weekly magnesium infusion as needed hypomagnesemia + CHF [nonischemic cardiomyopathy], aortic stenosis, follows up with cardiology.  Today she reports recent onset of respiratory symptoms. She reported feeling unwell since 2 days ago, nasal congestion. The patient noted a low-grade fever, with temperatures never exceeding  99.6-99.7 degrees Fahrenheit. She also reported coughing up a small amount of blood, as well small amount of blood when she blows nose  Intermittently she has mild shortness of breath when walking, chronic symptom for her.   Past Medical History:  Diagnosis Date   Anemia    Anxiety    Aortic stenosis    a. 05/2020 Echo: EF 45-50%, sev AS - seen by TAVR team @ [facility] - CTA sugg of tricuspid valve w/ fusing of 2 leaflets-TAVR deferred in setting of acute infxn; b. 07/2020 Echo: EF 50-55%, mod-sev AS; c. 12/2020 Echo: EF>55%. Mod-sev paradoxical low-flow low-gradient AS; d. 08/2021 Echo: EF 35-40%, severe AS, triv AI, mild MR.   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 Variable EF over time; b. 08/2017 Echo: EF 40%; b. 03/2020 Echo: EF 55-60%; c. 05/2020 Echo: EF 45-50%; d. 07/2020 Echo: EF 50-55%; e. 12/2020 Echo: EF>55%; f. 08/2021 Echo: EF 35-40%.   Chronic heart failure with preserved ejection fraction (HFpEF) (HCC)    a. Variable EF over time; b. 08/2017 Echo: EF 40%; b. 03/2020 Echo: EF 55-60%; c. 05/2020 Echo: EF 45-50%; d. 07/2020 Echo: EF 50-55%; e. 12/2020 Echo: EF>55%; f. 08/2021 Echo: EF 35-40%, no RV, mildly dil LA, mild MR, triv AI, severe AS.   Chronic pain syndrome 07/08/2016   CKD (chronic kidney disease) stage 3, GFR 30-59 ml/min (HCC)    Depression    Diabetes mellitus (HCC)    Dizziness    Fatty liver    Frequent falls    GERD (gastroesophageal reflux disease)    Gout    Heart murmur    History of blood transfusion    History of bone marrow transplant (HCC)    History of uterine fibroid    Hx of cardiac catheterization    a. 01/2016 [facility] - after abnl nuc): Nl cors.   Hypertension    Hypomagnesemia    IDA (iron deficiency anemia)    Multiple myeloma (HCC)    NSTEMI (non-ST elevated myocardial infarction) (HCC) 08/21/2021   Personal history of  chemotherapy    Pleurisy 04/09/2022   PSVT (paroxysmal supraventricular tachycardia) (HCC)    a. S/p RFCA [facility]).   PVC's (premature ventricular contractions)    a. Well-managed w/ bisoprolol in outpt setting.   Renal cyst     Past Surgical History:  Procedure Laterality Date   ABDOMINAL HYSTERECTOMY     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  CARDIAC ELECTROPHYSIOLOGY MAPPING AND ABLATION     CARPAL TUNNEL RELEASE Bilateral    CHOLECYSTECTOMY  2008   COLONOSCOPY WITH PROPOFOL N/A 05/07/2017   Procedure: COLONOSCOPY WITH PROPOFOL;  Surgeon: Wyline Mood, MD;  Location: [facility] ENDOSCOPY;  Service: Gastroenterology;  Laterality: N/A;   COLONOSCOPY WITH PROPOFOL N/A 12/19/2020   Procedure: COLONOSCOPY WITH PROPOFOL;  Surgeon: Wyline Mood, MD;  Location: [facility] ENDOSCOPY;  Service: Gastroenterology;  Laterality: N/A;   ESOPHAGOGASTRODUODENOSCOPY (EGD) WITH PROPOFOL N/A 05/07/2017   Procedure: ESOPHAGOGASTRODUODENOSCOPY (EGD) WITH PROPOFOL;  Surgeon: Wyline Mood, MD;  Location: [facility] ENDOSCOPY;  Service: Gastroenterology;  Laterality: N/A;   FOOT SURGERY Bilateral    INCONTINENCE SURGERY  2009   INTERSTIM IMPLANT PLACEMENT     other     over active bladder   OTHER SURGICAL HISTORY     bladder stimulator    PARTIAL HYSTERECTOMY  03/1996   fibroids   PORTA CATH INSERTION N/A 03/10/2019   Procedure: PORTA CATH INSERTION;  Surgeon: Annice Needy, MD;  Location: [facility] INVASIVE CV LAB;  Service: Cardiovascular;  Laterality: N/A;   TONSILLECTOMY  2007    Family History  Problem Relation Age of Onset   Colon cancer Father    Renal Disease Father    Diabetes Mellitus II Father    Melanoma Paternal Grandmother    Breast cancer Maternal Aunt 50   Anemia Mother    Heart disease Mother    Heart failure Mother    Renal Disease Mother    Congestive Heart Failure Mother    Heart disease Maternal Uncle    Throat cancer Maternal Uncle    Lung cancer Maternal Uncle    Liver disease Maternal Uncle     Heart failure Maternal Uncle    Hearing loss Son 41       Suicide     Social History:  reports that she quit smoking about 31 years ago. Her smoking use included cigarettes. She started smoking about 51 years ago. She has a 20 pack-year smoking history. She has never used smokeless tobacco. She reports current alcohol use of about 2.0 standard drinks of alcohol per week. She reports that she does not use drugs.  She is on disability. She notes exposure to perchloroethylene [facility]).   Allergies:  Allergies  Allergen Reactions   Oxycodone-Acetaminophen Anaphylaxis    Swelling and rash   Celebrex [Celecoxib] Diarrhea   Codeine    Plerixafor     In 2016 during ASCT collection patient developed fever to 103.13F and required hospitalization   Benadryl [Diphenhydramine] Palpitations   Morphine Itching and Rash   Ondansetron Diarrhea   Tylenol [Acetaminophen] Itching and Rash    Current Medications: Current Outpatient Medications  Medication Sig Dispense Refill   allopurinol (ZYLOPRIM) 100 MG tablet TAKE 1 TABLET(100 MG) BY MOUTH DAILY as needed 90 tablet 1   amiodarone (PACERONE) 200 MG tablet Take 200 mg by mouth daily.     atorvastatin (LIPITOR) 10 MG tablet Take 0.5 tablets (5 mg total) by mouth daily. (Patient not taking: Reported on 06/02/2023) 45 tablet 1   bisoprolol (ZEBETA) 10 MG tablet Take 1 tablet (10 mg total) by mouth daily. 90 tablet 1   calcium carbonate (OS-CAL) 1250 (500 Ca) MG chewable tablet Chew 2 tablets by mouth daily.     diazepam (VALIUM) 5 MG tablet Take 0.5-1 tablets (2.5-5 mg total) by mouth at bedtime as needed (insomnia). 30 tablet 3   diclofenac sodium (VOLTAREN) 1 % GEL Apply 2 g  topically 4 (four) times daily. 100 g 1   diphenoxylate-atropine (LOMOTIL) 2.5-0.025 MG tablet Take 2 tablets by mouth 4 (four) times daily as needed for diarrhea or loose stools. 180 tablet 3   FLUoxetine (PROZAC) 40 MG capsule Take 1 capsule (40 mg total) by mouth daily. 90  capsule 0   furosemide (LASIX) 40 MG tablet Take 0.5 tablets (20 mg total) by mouth daily. Home dose.     glipiZIDE (GLUCOTROL XL) 2.5 MG 24 hr tablet Take 1 tablet (2.5 mg total) by mouth daily with breakfast. 90 tablet 1   glucose blood test strip      lidocaine (XYLOCAINE) 2 % solution Use as directed 15 mLs in the mouth or throat every 3 (three) hours as needed for mouth pain (swish and spit). 100 mL 0   losartan (COZAAR) 25 MG tablet Take 1 tablet by mouth daily.     magnesium chloride (SLOW-MAG) 64 MG TBEC SR tablet Take 1 tablet (64 mg total) by mouth daily. 60 tablet 1   Multiple Vitamin (MULTIVITAMIN) tablet Take 1 tablet by mouth daily.     omeprazole (PRILOSEC) 40 MG capsule TAKE 1 CAPSULE IN THE MORNING AND TAKE 1 CAPSULE AT BEDTIME 180 capsule 3   pentoxifylline (TRENTAL) 400 MG CR tablet Take 400 mg by mouth 3 (three) times daily with meals.     potassium chloride SA (KLOR-CON M) 20 MEQ tablet Take 1 tablet (20 mEq total) by mouth daily. 7 tablet 0   promethazine (PHENERGAN) 25 MG tablet Take 1 tablet (25 mg total) by mouth every 6 (six) hours as needed for nausea or vomiting. 90 tablet 1   spironolactone (ALDACTONE) 25 MG tablet Take by mouth.     tiZANidine (ZANAFLEX) 4 MG tablet Take 1 tablet (4 mg total) by mouth every 6 (six) hours as needed for muscle spasms. 90 tablet 1   traMADol (ULTRAM) 50 MG tablet      traZODone (DESYREL) 100 MG tablet TAKE 1 TABLET AT BEDTIME 90 tablet 3   vitamin E 45 MG (100 UNITS) capsule Take by mouth daily.     No current facility-administered medications for this visit.   Facility-Administered Medications Ordered in Other Visits  Medication Dose Route Frequency Provider Last Rate Last Admin   0.9 %  sodium chloride infusion   Intravenous Continuous Rickard Patience, MD   Stopped at 06/05/23 1305   sodium chloride flush (NS) 0.9 % injection 10 mL  10 mL Intravenous PRN Rickard Patience, MD   10 mL at 04/02/21 6962    Review of Systems  Constitutional:   Negative for chills, fever, malaise/fatigue and weight loss.  HENT:  Positive for congestion. Negative for sore throat.   Eyes:  Negative for redness.  Respiratory:  Positive for cough. Negative for shortness of breath and wheezing.   Cardiovascular:  Negative for chest pain, palpitations and leg swelling.  Gastrointestinal:  Positive for diarrhea. Negative for abdominal pain, blood in stool, nausea and vomiting.  Genitourinary:  Negative for dysuria.  Musculoskeletal:  Positive for back pain and joint pain. Negative for myalgias.  Skin:  Negative for rash.  Neurological:  Positive for tingling and sensory change. Negative for dizziness and tremors.  Endo/Heme/Allergies:  Does not bruise/bleed easily.  Psychiatric/Behavioral:  Negative for hallucinations.     Performance status (ECOG): 1  Vitals Blood pressure (!) 121/56, pulse (!) 58, temperature (!) 96.6 F (35.9 C), temperature source Tympanic, weight 164 lb 4.8 oz (74.5 kg), SpO2 98%.  Physical Exam Constitutional:      General: She is not in acute distress.    Appearance: She is not diaphoretic.  HENT:     Head: Normocephalic and atraumatic.  Eyes:     General: No scleral icterus.    Pupils: Pupils are equal, round, and reactive to light.  Cardiovascular:     Rate and Rhythm: Normal rate.     Heart sounds: Murmur heard.  Pulmonary:     Effort: Pulmonary effort is normal. No respiratory distress.     Breath sounds: No rales.  Chest:     Chest wall: No tenderness.  Abdominal:     General: There is no distension.  Musculoskeletal:        General: Normal range of motion.     Cervical back: Normal range of motion and neck supple.  Skin:    General: Skin is warm and dry.     Findings: No erythema.     Comments: Lower extremity cat bite cellulitis has improved.   Neurological:     Mental Status: She is alert and oriented to person, place, and time.     Cranial Nerves: No cranial nerve deficit.     Motor: No abnormal  muscle tone.     Coordination: Coordination normal.  Psychiatric:        Mood and Affect: Mood and affect normal.      Laboratory data    Latest Ref Rng & Units 06/05/2023    9:34 AM 04/24/2023    9:52 AM 03/13/2023    8:23 AM  CBC  WBC 4.0 - 10.5 K/uL 7.5  4.9  6.0   Hemoglobin 12.0 - 15.0 g/dL 9.5  29.5  18.8   Hematocrit 36.0 - 46.0 % 28.5  33.0  33.6   Platelets 150 - 400 K/uL 127  127  105       Latest Ref Rng & Units 06/05/2023    9:34 AM 04/24/2023    9:52 AM 03/13/2023    8:23 AM  CMP  Glucose 70 - 99 mg/dL 416  606  301   BUN 8 - 23 mg/dL 31  27  18    Creatinine 0.44 - 1.00 mg/dL 6.01  0.93  2.35   Sodium 135 - 145 mmol/L 137  137  138   Potassium 3.5 - 5.1 mmol/L 4.1  4.5  4.1   Chloride 98 - 111 mmol/L 105  108  105   CO2 22 - 32 mmol/L 23  22  26    Calcium 8.9 - 10.3 mg/dL 8.3  9.3  8.9   Total Protein 6.5 - 8.1 g/dL 6.9  7.0  7.3   Total Bilirubin <1.2 mg/dL 0.4  0.5  0.4   Alkaline Phos 38 - 126 U/L 100  75  99   AST 15 - 41 U/L 22  28  26    ALT 0 - 44 U/L 23  28  29

## 2023-06-05 NOTE — Assessment & Plan Note (Signed)
Obtain CXR Check respiratory panel.

## 2023-06-05 NOTE — Telephone Encounter (Signed)
Mychart message sent to pt. Sarah [name] will you please schedule IV venofer weekly x3 . Per MD, can be done along with IV mag appt.

## 2023-06-05 NOTE — Telephone Encounter (Signed)
-----   Message from Rickard Patience sent at 06/05/2023  1:30 PM EST ----- Please arrange her to get IV Venofer weekly x 3 [can be done along with her IV Mag appt]

## 2023-06-06 ENCOUNTER — Encounter: Payer: Self-pay | Admitting: Internal Medicine

## 2023-06-06 ENCOUNTER — Encounter: Payer: Self-pay | Admitting: Oncology

## 2023-06-06 LAB — KAPPA/LAMBDA LIGHT CHAINS
Kappa free light chain: 27.2 mg/L — ABNORMAL HIGH (ref 3.3–19.4)
Kappa, lambda light chain ratio: 0.95 (ref 0.26–1.65)
Lambda free light chains: 28.6 mg/L — ABNORMAL HIGH (ref 5.7–26.3)

## 2023-06-09 ENCOUNTER — Encounter: Payer: Self-pay | Admitting: Gastroenterology

## 2023-06-09 ENCOUNTER — Ambulatory Visit (INDEPENDENT_AMBULATORY_CARE_PROVIDER_SITE_OTHER): Payer: Medicare PPO | Admitting: Gastroenterology

## 2023-06-09 VITALS — BP 159/69 | HR 59 | Temp 97.8°F | Wt 158.0 lb

## 2023-06-09 DIAGNOSIS — Z8619 Personal history of other infectious and parasitic diseases: Secondary | ICD-10-CM | POA: Diagnosis not present

## 2023-06-09 DIAGNOSIS — I350 Nonrheumatic aortic (valve) stenosis: Secondary | ICD-10-CM

## 2023-06-09 DIAGNOSIS — K529 Noninfective gastroenteritis and colitis, unspecified: Secondary | ICD-10-CM

## 2023-06-09 MED ORDER — CHOLESTYRAMINE 4 GM/DOSE PO POWD: 4.0000 g | Freq: Two times a day (BID) | ORAL | 2 refills | Status: DC

## 2023-06-09 NOTE — Progress Notes (Signed)
Sarah Mood MD, MRCP(U.K) [address]  [address]  [place], [place] [address]  Main: [phone]  Fax: [phone]   Primary Care Physician: [name], MD  Primary Gastroenterologist:  Dr. Wyline [name]   Chief Complaint  Patient presents with   Diarrhea    HPI: Sarah [name] is a 67 y.o. female Summary of history :   She was last seen in my office back in 2022 I have seen her back in 2018 for history of diarrhea alternating with constipation. She has a history of multiple myeloma s/p autologous bone marrow transplantation in 2021, constipation, has been using fentanyl patch for chronic pain.  Bowel movements are preceded by abdominal distention and bloating.  Has tried MiraLAX, fiber supplements docusate, senna which has not worked.  Denies use of any NSAIDs she is on metformin.  Complains of chronic nausea that began shortly after she eats.  She has a sensation of food sitting in her stomach for a long period of time.  We did initially try to put her on a high-fiber diet but it caused her to get significant diarrhea and it was stopped.   05/07/2017: EGD: Biopsies of stomach showed antral type mucosa with features of a healing mucosal injury.  I also performed a colonoscopy on the same day which was normal. Has had recurrent myeloma. 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  Has received chemotherapy.  She has severe aortic stenosis based on echo   05/20/2020.  Cardiology is following for possible TAVR.      02/18/2023 Campylobacter and enteropathogenic E. coli positive.She was tested and positive for above Campylobacter and E. coli in 02/18/2023.  States she was prescribed some antibiotics for the same but did not take it due to drug interaction continues to have diarrhea going on for a few months takes ibuprofen and naproxen on a daily basis for back pain.  The reason she did not take the antibiotics was due tointeraction with her cardiac  medications   03/13/2023 hemoglobin 11.1 g    Interval history 04/18/2023-06/09/2023  04/16/2023: GI PCR, fecal calprotectin, stool for c diff negative.  Since her last visit the diarrhea has resolved. Denies any regular NSAID use not with any artificial sugars weakness.  No other complaints.  She does not have a gallbladder.    Current Outpatient Medications  Medication Sig Dispense Refill   allopurinol (ZYLOPRIM) 100 MG tablet TAKE 1 TABLET(100 MG) BY MOUTH DAILY as needed 90 tablet 1   amiodarone (PACERONE) 200 MG tablet Take 200 mg by mouth daily.     atorvastatin (LIPITOR) 10 MG tablet Take 0.5 tablets (5 mg total) by mouth daily. 45 tablet 1   bisoprolol (ZEBETA) 10 MG tablet Take 1 tablet (10 mg total) by mouth daily. 90 tablet 1   calcium carbonate (OS-CAL) 1250 (500 Ca) MG chewable tablet Chew 2 tablets by mouth daily.     diazepam (VALIUM) 5 MG tablet Take 0.5-1 tablets (2.5-5 mg total) by mouth at bedtime as needed (insomnia). 30 tablet 3   diclofenac sodium (VOLTAREN) 1 % GEL Apply 2 g topically 4 (four) times daily. 100 g 1   diphenoxylate-atropine (LOMOTIL) 2.5-0.025 MG tablet Take 2 tablets by mouth 4 (four) times daily as needed for diarrhea or loose stools. 180 tablet 3   FLUoxetine (PROZAC) 40 MG capsule Take 1 capsule (40 mg total) by mouth daily. 90 capsule 0   furosemide (LASIX) 40 MG tablet Take 0.5  tablets (20 mg total) by mouth daily. Home dose.     glipiZIDE (GLUCOTROL XL) 2.5 MG 24 hr tablet Take 1 tablet (2.5 mg total) by mouth daily with breakfast. 90 tablet 1   glucose blood test strip      lidocaine (XYLOCAINE) 2 % solution Use as directed 15 mLs in the mouth or throat every 3 (three) hours as needed for mouth pain (swish and spit). 100 mL 0   losartan (COZAAR) 25 MG tablet Take 1 tablet by mouth daily.     magnesium chloride (SLOW-MAG) 64 MG TBEC SR tablet Take 1 tablet (64 mg total) by mouth daily. 60 tablet 1   Multiple Vitamin (MULTIVITAMIN) tablet Take 1  tablet by mouth daily.     omeprazole (PRILOSEC) 40 MG capsule TAKE 1 CAPSULE IN THE MORNING AND TAKE 1 CAPSULE AT BEDTIME 180 capsule 3   pentoxifylline (TRENTAL) 400 MG CR tablet Take 400 mg by mouth 3 (three) times daily with meals.     potassium chloride SA (KLOR-CON M) 20 MEQ tablet Take 1 tablet (20 mEq total) by mouth daily. 7 tablet 0   promethazine (PHENERGAN) 25 MG tablet Take 1 tablet (25 mg total) by mouth every 6 (six) hours as needed for nausea or vomiting. 90 tablet 1   spironolactone (ALDACTONE) 25 MG tablet Take by mouth.     tiZANidine (ZANAFLEX) 4 MG tablet Take 1 tablet (4 mg total) by mouth every 6 (six) hours as needed for muscle spasms. 90 tablet 1   traMADol (ULTRAM) 50 MG tablet      traZODone (DESYREL) 100 MG tablet TAKE 1 TABLET AT BEDTIME 90 tablet 3   vitamin E 45 MG (100 UNITS) capsule Take by mouth daily.     No current facility-administered medications for this visit.   Facility-Administered Medications Ordered in Other Visits  Medication Dose Route Frequency Provider Last Rate Last Admin   sodium chloride flush (NS) 0.9 % injection 10 mL  10 mL Intravenous PRN Rickard Patience, MD   10 mL at 04/02/21 0904    Allergies as of 06/09/2023 - Review Complete 06/09/2023  Allergen Reaction Noted   Oxycodone-acetaminophen Anaphylaxis 09/01/2011   Celebrex [celecoxib] Diarrhea 01/04/2019   Codeine  09/05/2017   Plerixafor  12/31/2019   Benadryl [diphenhydramine] Palpitations 04/02/2016   Morphine Itching and Rash 04/02/2016   Ondansetron Diarrhea 09/01/2011   Tylenol [acetaminophen] Itching and Rash 04/02/2016      ROS:  General: Negative for anorexia, weight loss, fever, chills, fatigue, weakness. ENT: Negative for hoarseness, difficulty swallowing , nasal congestion. CV: Negative for chest pain, angina, palpitations, dyspnea on exertion, peripheral edema.  Respiratory: Negative for dyspnea at rest, dyspnea on exertion, cough, sputum, wheezing.  GI: See history  of present illness. GU:  Negative for dysuria, hematuria, urinary incontinence, urinary frequency, nocturnal urination.  Endo: Negative for unusual weight change.    Physical Examination:   BP (!) 159/69   Pulse (!) 59   Temp 97.8 F (36.6 C) (Oral)   Wt 158 lb (71.7 kg)   BMI 27.99 kg/m   General: Well-nourished, well-developed in no acute distress.  Eyes: No icterus. Conjunctivae pink. Mouth: Oropharyngeal mucosa moist and pink , no lesions erythema or exudate. Neuro: Alert and oriented x 3.  Grossly intact. Skin: Warm and dry, no jaundice.   Psych: Alert and cooperative, normal [name] and affect.   Imaging Studies: DG Chest 2 View  Result Date: 06/05/2023 CLINICAL DATA:  Cough, fever. EXAM: CHEST -  2 VIEW COMPARISON:  July 22, 2022. FINDINGS: Stable cardiomediastinal silhouette. Right internal jugular Port-A-Cath is unchanged. Mild central pulmonary vascular congestion is noted with probable bilateral pulmonary edema. Bony thorax is unremarkable. IMPRESSION: Mild central pulmonary vascular congestion is noted with probable bilateral pulmonary edema. Electronically Signed   By: Lupita Raider M.D.   On: 06/05/2023 15:18    Assessment and Plan:   Malynn Nanney is a 67 y.o. y/o female with a history of multiple myeloma status post bone marrow transplant.  History of severe aortic stenosis .previously  evaluated with upper endoscopy and colonoscopy  for epigastric pain back in 2018.  Previously had diarrhea secondary to consumption of artificial sugars and foods.  .  She recently had stool test done about 2 months back was positive for Campylobacter and E. coli but was not given any antibiotics because of concerns of drug drug interaction. Repeat stool studies showed no evidence of infection or inflammation.  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  Plan 1.   Provided a  prescription of cholestyramine to use as needed for bile salt mediated diarrhea.  Presently has no diarrhea    Dr Sarah Mood  MD,MRCP [facility]) Follow up as needed

## 2023-06-10 MED ORDER — CHOLESTYRAMINE 4 GM/DOSE PO POWD: 4.0000 g | Freq: Two times a day (BID) | ORAL | 2 refills | Status: AC

## 2023-06-10 NOTE — Addendum Note (Signed)
Addended by: Adela Ports on: 06/10/2023 01:17 PM   Modules accepted: Orders

## 2023-06-12 ENCOUNTER — Inpatient Hospital Stay: Payer: Medicare PPO

## 2023-06-12 VITALS — BP 112/56 | HR 54

## 2023-06-12 DIAGNOSIS — R059 Cough, unspecified: Secondary | ICD-10-CM | POA: Diagnosis not present

## 2023-06-12 DIAGNOSIS — Z95828 Presence of other vascular implants and grafts: Secondary | ICD-10-CM

## 2023-06-12 DIAGNOSIS — E538 Deficiency of other specified B group vitamins: Secondary | ICD-10-CM | POA: Diagnosis not present

## 2023-06-12 DIAGNOSIS — D500 Iron deficiency anemia secondary to blood loss (chronic): Secondary | ICD-10-CM | POA: Diagnosis not present

## 2023-06-12 DIAGNOSIS — R0602 Shortness of breath: Secondary | ICD-10-CM | POA: Diagnosis not present

## 2023-06-12 DIAGNOSIS — C9001 Multiple myeloma in remission: Secondary | ICD-10-CM | POA: Diagnosis not present

## 2023-06-12 DIAGNOSIS — I350 Nonrheumatic aortic (valve) stenosis: Secondary | ICD-10-CM | POA: Diagnosis not present

## 2023-06-12 DIAGNOSIS — C9000 Multiple myeloma not having achieved remission: Secondary | ICD-10-CM

## 2023-06-12 DIAGNOSIS — I5032 Chronic diastolic (congestive) heart failure: Secondary | ICD-10-CM | POA: Diagnosis not present

## 2023-06-12 DIAGNOSIS — I252 Old myocardial infarction: Secondary | ICD-10-CM | POA: Diagnosis not present

## 2023-06-12 LAB — MAGNESIUM: Magnesium: 1.2 mg/dL — ABNORMAL LOW (ref 1.7–2.4)

## 2023-06-12 MED ORDER — SODIUM CHLORIDE 0.9% FLUSH
10.0000 mL | Freq: Once | INTRAVENOUS | Status: AC
Start: 2023-06-12 — End: 2023-06-12
  Administered 2023-06-12: 10 mL via INTRAVENOUS
  Filled 2023-06-12: qty 10

## 2023-06-12 MED ORDER — HEPARIN SOD (PORK) LOCK FLUSH 100 UNIT/ML IV SOLN
500.0000 [IU] | Freq: Once | INTRAVENOUS | Status: AC
Start: 2023-06-12 — End: 2023-06-12
  Administered 2023-06-12: 500 [IU]
  Filled 2023-06-12: qty 5

## 2023-06-12 MED ORDER — SODIUM CHLORIDE 0.9 % IV SOLN
INTRAVENOUS | Status: DC
  Filled 2023-06-12: qty 250

## 2023-06-12 MED ORDER — MAGNESIUM SULFATE 2 GM/50ML IV SOLN
2.0000 g | Freq: Once | INTRAVENOUS | Status: AC
  Administered 2023-06-12: 2 g via INTRAVENOUS

## 2023-06-12 MED ORDER — MAGNESIUM SULFATE 50 % IJ SOLN
4.0000 g | Freq: Once | INTRAMUSCULAR | Status: DC
  Filled 2023-06-12: qty 8

## 2023-06-12 MED ORDER — SODIUM CHLORIDE 0.9 % IV SOLN
25.0000 mg | Freq: Once | INTRAVENOUS | Status: AC
  Administered 2023-06-12: 25 mg via INTRAVENOUS
  Filled 2023-06-12: qty 1

## 2023-06-12 MED ORDER — MAGNESIUM SULFATE 4 GM/100ML IV SOLN
4.0000 g | Freq: Once | INTRAVENOUS | Status: AC
  Administered 2023-06-12: 4 g via INTRAVENOUS

## 2023-06-12 NOTE — Addendum Note (Signed)
Addended by: Keitha Butte on: 06/12/2023 02:54 PM   Modules accepted: Orders

## 2023-06-13 DIAGNOSIS — I509 Heart failure, unspecified: Secondary | ICD-10-CM | POA: Diagnosis not present

## 2023-06-13 DIAGNOSIS — R0600 Dyspnea, unspecified: Secondary | ICD-10-CM | POA: Diagnosis not present

## 2023-06-13 DIAGNOSIS — R079 Chest pain, unspecified: Secondary | ICD-10-CM | POA: Diagnosis not present

## 2023-06-16 LAB — MULTIPLE MYELOMA PANEL, SERUM
Albumin SerPl Elph-Mcnc: 3.6 g/dL (ref 2.9–4.4)
Albumin/Glob SerPl: 1.3 (ref 0.7–1.7)
Alpha 1: 0.3 g/dL (ref 0.0–0.4)
Alpha2 Glob SerPl Elph-Mcnc: 0.9 g/dL (ref 0.4–1.0)
B-Globulin SerPl Elph-Mcnc: 1 g/dL (ref 0.7–1.3)
Gamma Glob SerPl Elph-Mcnc: 0.5 g/dL (ref 0.4–1.8)
Globulin, Total: 2.8 g/dL (ref 2.2–3.9)
IgA: 237 mg/dL (ref 87–352)
IgG (Immunoglobin G), Serum: 632 mg/dL (ref 586–1602)
IgM (Immunoglobulin M), Srm: 24 mg/dL — ABNORMAL LOW (ref 26–217)
Total Protein ELP: 6.4 g/dL (ref 6.0–8.5)

## 2023-06-18 ENCOUNTER — Encounter: Payer: Self-pay | Admitting: *Deleted

## 2023-06-18 DIAGNOSIS — Z79899 Other long term (current) drug therapy: Secondary | ICD-10-CM | POA: Diagnosis not present

## 2023-06-18 DIAGNOSIS — I350 Nonrheumatic aortic (valve) stenosis: Secondary | ICD-10-CM | POA: Diagnosis not present

## 2023-06-18 DIAGNOSIS — I493 Ventricular premature depolarization: Secondary | ICD-10-CM | POA: Diagnosis not present

## 2023-06-18 DIAGNOSIS — I5022 Chronic systolic (congestive) heart failure: Secondary | ICD-10-CM | POA: Diagnosis not present

## 2023-06-18 DIAGNOSIS — R3589 Other polyuria: Secondary | ICD-10-CM | POA: Diagnosis not present

## 2023-06-18 DIAGNOSIS — Z5181 Encounter for therapeutic drug level monitoring: Secondary | ICD-10-CM | POA: Diagnosis not present

## 2023-06-18 DIAGNOSIS — E861 Hypovolemia: Secondary | ICD-10-CM | POA: Diagnosis not present

## 2023-06-18 DIAGNOSIS — I428 Other cardiomyopathies: Secondary | ICD-10-CM | POA: Diagnosis not present

## 2023-06-19 ENCOUNTER — Inpatient Hospital Stay: Payer: Medicare PPO

## 2023-06-19 ENCOUNTER — Other Ambulatory Visit: Payer: Self-pay

## 2023-06-19 VITALS — BP 126/59 | HR 59

## 2023-06-19 VITALS — BP 129/66 | HR 62 | Temp 98.3°F | Resp 18

## 2023-06-19 DIAGNOSIS — C9001 Multiple myeloma in remission: Secondary | ICD-10-CM

## 2023-06-19 DIAGNOSIS — R0602 Shortness of breath: Secondary | ICD-10-CM | POA: Diagnosis not present

## 2023-06-19 DIAGNOSIS — I350 Nonrheumatic aortic (valve) stenosis: Secondary | ICD-10-CM | POA: Diagnosis not present

## 2023-06-19 DIAGNOSIS — E538 Deficiency of other specified B group vitamins: Secondary | ICD-10-CM | POA: Diagnosis not present

## 2023-06-19 DIAGNOSIS — I5032 Chronic diastolic (congestive) heart failure: Secondary | ICD-10-CM | POA: Diagnosis not present

## 2023-06-19 DIAGNOSIS — R059 Cough, unspecified: Secondary | ICD-10-CM | POA: Diagnosis not present

## 2023-06-19 DIAGNOSIS — D500 Iron deficiency anemia secondary to blood loss (chronic): Secondary | ICD-10-CM | POA: Diagnosis not present

## 2023-06-19 DIAGNOSIS — E860 Dehydration: Secondary | ICD-10-CM

## 2023-06-19 DIAGNOSIS — I252 Old myocardial infarction: Secondary | ICD-10-CM | POA: Diagnosis not present

## 2023-06-19 LAB — BASIC METABOLIC PANEL - CANCER CENTER ONLY
Anion gap: 13 (ref 5–15)
BUN: 46 mg/dL — ABNORMAL HIGH (ref 8–23)
CO2: 23 mmol/L (ref 22–32)
Calcium: 9 mg/dL (ref 8.9–10.3)
Chloride: 101 mmol/L (ref 98–111)
Creatinine: 1.59 mg/dL — ABNORMAL HIGH (ref 0.44–1.00)
GFR, Estimated: 36 mL/min — ABNORMAL LOW (ref >60)
Glucose, Bld: 134 mg/dL — ABNORMAL HIGH (ref 70–99)
Potassium: 4 mmol/L (ref 3.5–5.1)
Sodium: 137 mmol/L (ref 135–145)

## 2023-06-19 LAB — MAGNESIUM: Magnesium: 1.5 mg/dL — ABNORMAL LOW (ref 1.7–2.4)

## 2023-06-19 MED ORDER — SODIUM CHLORIDE 0.9% FLUSH
10.0000 mL | Freq: Once | INTRAVENOUS | Status: AC | PRN
Start: 2023-06-19 — End: ?
  Filled 2023-06-19: qty 10

## 2023-06-19 MED ORDER — SODIUM CHLORIDE 0.9 % IV SOLN
25.0000 mg | Freq: Once | INTRAVENOUS | Status: AC
Start: 2023-06-19 — End: 2023-06-19
  Administered 2023-06-19: 25 mg via INTRAVENOUS
  Filled 2023-06-19: qty 1

## 2023-06-19 MED ORDER — SODIUM CHLORIDE 0.9% FLUSH
10.0000 mL | Freq: Once | INTRAVENOUS | Status: AC | PRN
Start: 2023-06-19 — End: 2023-06-19
  Administered 2023-06-19: 10 mL
  Filled 2023-06-19: qty 10

## 2023-06-19 MED ORDER — SODIUM CHLORIDE 0.9 % IV SOLN
Freq: Once | INTRAVENOUS | Status: AC
  Filled 2023-06-19: qty 250

## 2023-06-19 MED ORDER — HEPARIN SOD (PORK) LOCK FLUSH 100 UNIT/ML IV SOLN
500.0000 [IU] | Freq: Once | INTRAVENOUS | Status: AC | PRN
  Administered 2023-06-19: 500 [IU]
  Filled 2023-06-19: qty 5

## 2023-06-19 MED ORDER — IRON SUCROSE 20 MG/ML IV SOLN
200.0000 mg | Freq: Once | INTRAVENOUS | Status: AC
Start: 2023-06-19 — End: 2023-06-19
  Administered 2023-06-19: 200 mg via INTRAVENOUS
  Filled 2023-06-19: qty 10

## 2023-06-19 MED ORDER — MAGNESIUM SULFATE 4 GM/100ML IV SOLN
4.0000 g | Freq: Once | INTRAVENOUS | Status: AC
Start: 2023-06-19 — End: 2023-06-19
  Administered 2023-06-19: 4 g via INTRAVENOUS
  Filled 2023-06-19: qty 100

## 2023-06-26 ENCOUNTER — Inpatient Hospital Stay: Payer: Medicare PPO

## 2023-06-26 VITALS — BP 159/80 | HR 64 | Temp 95.5°F | Resp 19

## 2023-06-26 DIAGNOSIS — R0602 Shortness of breath: Secondary | ICD-10-CM | POA: Diagnosis not present

## 2023-06-26 DIAGNOSIS — C9001 Multiple myeloma in remission: Secondary | ICD-10-CM

## 2023-06-26 DIAGNOSIS — I252 Old myocardial infarction: Secondary | ICD-10-CM | POA: Diagnosis not present

## 2023-06-26 DIAGNOSIS — I5032 Chronic diastolic (congestive) heart failure: Secondary | ICD-10-CM | POA: Diagnosis not present

## 2023-06-26 DIAGNOSIS — R059 Cough, unspecified: Secondary | ICD-10-CM | POA: Diagnosis not present

## 2023-06-26 DIAGNOSIS — I350 Nonrheumatic aortic (valve) stenosis: Secondary | ICD-10-CM | POA: Diagnosis not present

## 2023-06-26 DIAGNOSIS — E538 Deficiency of other specified B group vitamins: Secondary | ICD-10-CM | POA: Diagnosis not present

## 2023-06-26 DIAGNOSIS — D500 Iron deficiency anemia secondary to blood loss (chronic): Secondary | ICD-10-CM | POA: Diagnosis not present

## 2023-06-26 DIAGNOSIS — Z95828 Presence of other vascular implants and grafts: Secondary | ICD-10-CM

## 2023-06-26 LAB — BASIC METABOLIC PANEL - CANCER CENTER ONLY
Anion gap: 12 (ref 5–15)
BUN: 42 mg/dL — ABNORMAL HIGH (ref 8–23)
CO2: 24 mmol/L (ref 22–32)
Calcium: 8.7 mg/dL — ABNORMAL LOW (ref 8.9–10.3)
Chloride: 99 mmol/L (ref 98–111)
Creatinine: 1.57 mg/dL — ABNORMAL HIGH (ref 0.44–1.00)
GFR, Estimated: 36 mL/min — ABNORMAL LOW (ref >60)
Glucose, Bld: 150 mg/dL — ABNORMAL HIGH (ref 70–99)
Potassium: 3.6 mmol/L (ref 3.5–5.1)
Sodium: 135 mmol/L (ref 135–145)

## 2023-06-26 LAB — MAGNESIUM: Magnesium: 1.2 mg/dL — ABNORMAL LOW (ref 1.7–2.4)

## 2023-06-26 MED ORDER — HEPARIN SOD (PORK) LOCK FLUSH 100 UNIT/ML IV SOLN
500.0000 [IU] | Freq: Once | INTRAVENOUS | Status: AC
  Administered 2023-06-26: 500 [IU] via INTRAVENOUS
  Filled 2023-06-26: qty 5

## 2023-06-26 MED ORDER — IRON SUCROSE 20 MG/ML IV SOLN
200.0000 mg | Freq: Once | INTRAVENOUS | Status: AC
  Administered 2023-06-26: 200 mg via INTRAVENOUS
  Filled 2023-06-26: qty 10

## 2023-06-26 MED ORDER — MAGNESIUM SULFATE 4 GM/100ML IV SOLN
4.0000 g | Freq: Once | INTRAVENOUS | Status: AC
  Administered 2023-06-26: 4 g via INTRAVENOUS
  Filled 2023-06-26: qty 100

## 2023-06-26 MED ORDER — SODIUM CHLORIDE 0.9 % IV SOLN
25.0000 mg | Freq: Once | INTRAVENOUS | Status: AC
  Administered 2023-06-26: 25 mg via INTRAVENOUS
  Filled 2023-06-26: qty 1

## 2023-06-26 MED ORDER — MAGNESIUM SULFATE 2 GM/50ML IV SOLN
2.0000 g | Freq: Once | INTRAVENOUS | Status: AC
  Administered 2023-06-26: 2 g via INTRAVENOUS
  Filled 2023-06-26: qty 50

## 2023-06-26 NOTE — Progress Notes (Signed)
Patient asking about additional fluids along with venofer and magnesium infusions today. Alena Bills, MD ordered BMP to assess need for fluids. BMP drawn and resulted. Per Alena Bills, MD will hold fluids today and just give the original magnesium and venofer ordered. Patient educated regarding treatment plan.

## 2023-07-03 ENCOUNTER — Inpatient Hospital Stay: Payer: Medicare PPO

## 2023-07-04 ENCOUNTER — Inpatient Hospital Stay: Payer: Medicare PPO

## 2023-07-04 ENCOUNTER — Inpatient Hospital Stay: Payer: Medicare PPO | Attending: Oncology

## 2023-07-04 VITALS — BP 131/66 | HR 66 | Temp 98.2°F | Resp 18

## 2023-07-04 DIAGNOSIS — D500 Iron deficiency anemia secondary to blood loss (chronic): Secondary | ICD-10-CM | POA: Diagnosis not present

## 2023-07-04 DIAGNOSIS — Z808 Family history of malignant neoplasm of other organs or systems: Secondary | ICD-10-CM | POA: Insufficient documentation

## 2023-07-04 DIAGNOSIS — Z9221 Personal history of antineoplastic chemotherapy: Secondary | ICD-10-CM | POA: Insufficient documentation

## 2023-07-04 DIAGNOSIS — I130 Hypertensive heart and chronic kidney disease with heart failure and stage 1 through stage 4 chronic kidney disease, or unspecified chronic kidney disease: Secondary | ICD-10-CM | POA: Insufficient documentation

## 2023-07-04 DIAGNOSIS — E538 Deficiency of other specified B group vitamins: Secondary | ICD-10-CM | POA: Diagnosis not present

## 2023-07-04 DIAGNOSIS — Z886 Allergy status to analgesic agent status: Secondary | ICD-10-CM | POA: Insufficient documentation

## 2023-07-04 DIAGNOSIS — I350 Nonrheumatic aortic (valve) stenosis: Secondary | ICD-10-CM | POA: Insufficient documentation

## 2023-07-04 DIAGNOSIS — I252 Old myocardial infarction: Secondary | ICD-10-CM | POA: Diagnosis not present

## 2023-07-04 DIAGNOSIS — C9001 Multiple myeloma in remission: Secondary | ICD-10-CM | POA: Diagnosis not present

## 2023-07-04 DIAGNOSIS — Z8379 Family history of other diseases of the digestive system: Secondary | ICD-10-CM | POA: Insufficient documentation

## 2023-07-04 DIAGNOSIS — R197 Diarrhea, unspecified: Secondary | ICD-10-CM | POA: Diagnosis not present

## 2023-07-04 DIAGNOSIS — Z9049 Acquired absence of other specified parts of digestive tract: Secondary | ICD-10-CM | POA: Insufficient documentation

## 2023-07-04 DIAGNOSIS — Z885 Allergy status to narcotic agent status: Secondary | ICD-10-CM | POA: Insufficient documentation

## 2023-07-04 DIAGNOSIS — F32A Depression, unspecified: Secondary | ICD-10-CM | POA: Insufficient documentation

## 2023-07-04 DIAGNOSIS — K219 Gastro-esophageal reflux disease without esophagitis: Secondary | ICD-10-CM | POA: Insufficient documentation

## 2023-07-04 DIAGNOSIS — I429 Cardiomyopathy, unspecified: Secondary | ICD-10-CM | POA: Insufficient documentation

## 2023-07-04 DIAGNOSIS — Z800 Family history of malignant neoplasm of digestive organs: Secondary | ICD-10-CM | POA: Insufficient documentation

## 2023-07-04 DIAGNOSIS — Z87891 Personal history of nicotine dependence: Secondary | ICD-10-CM | POA: Diagnosis not present

## 2023-07-04 DIAGNOSIS — Z79899 Other long term (current) drug therapy: Secondary | ICD-10-CM | POA: Insufficient documentation

## 2023-07-04 DIAGNOSIS — Z841 Family history of disorders of kidney and ureter: Secondary | ICD-10-CM | POA: Insufficient documentation

## 2023-07-04 DIAGNOSIS — G620 Drug-induced polyneuropathy: Secondary | ICD-10-CM | POA: Diagnosis not present

## 2023-07-04 DIAGNOSIS — Z8249 Family history of ischemic heart disease and other diseases of the circulatory system: Secondary | ICD-10-CM | POA: Insufficient documentation

## 2023-07-04 DIAGNOSIS — Z90710 Acquired absence of both cervix and uterus: Secondary | ICD-10-CM | POA: Diagnosis not present

## 2023-07-04 DIAGNOSIS — E1122 Type 2 diabetes mellitus with diabetic chronic kidney disease: Secondary | ICD-10-CM | POA: Diagnosis not present

## 2023-07-04 DIAGNOSIS — Z822 Family history of deafness and hearing loss: Secondary | ICD-10-CM | POA: Insufficient documentation

## 2023-07-04 DIAGNOSIS — Z801 Family history of malignant neoplasm of trachea, bronchus and lung: Secondary | ICD-10-CM | POA: Insufficient documentation

## 2023-07-04 DIAGNOSIS — Z803 Family history of malignant neoplasm of breast: Secondary | ICD-10-CM | POA: Insufficient documentation

## 2023-07-04 DIAGNOSIS — N1832 Chronic kidney disease, stage 3b: Secondary | ICD-10-CM | POA: Insufficient documentation

## 2023-07-04 DIAGNOSIS — I5032 Chronic diastolic (congestive) heart failure: Secondary | ICD-10-CM | POA: Diagnosis not present

## 2023-07-04 DIAGNOSIS — Z888 Allergy status to other drugs, medicaments and biological substances status: Secondary | ICD-10-CM | POA: Diagnosis not present

## 2023-07-04 DIAGNOSIS — T451X5A Adverse effect of antineoplastic and immunosuppressive drugs, initial encounter: Secondary | ICD-10-CM | POA: Diagnosis not present

## 2023-07-04 DIAGNOSIS — Z833 Family history of diabetes mellitus: Secondary | ICD-10-CM | POA: Insufficient documentation

## 2023-07-04 LAB — MAGNESIUM: Magnesium: 1.1 mg/dL — ABNORMAL LOW (ref 1.7–2.4)

## 2023-07-04 MED ORDER — HEPARIN SOD (PORK) LOCK FLUSH 100 UNIT/ML IV SOLN
500.0000 [IU] | Freq: Once | INTRAVENOUS | Status: AC | PRN
  Administered 2023-07-04: 500 [IU]
  Filled 2023-07-04: qty 5

## 2023-07-04 MED ORDER — MAGNESIUM SULFATE 2 GM/50ML IV SOLN
2.0000 g | Freq: Once | INTRAVENOUS | Status: AC
Start: 2023-07-04 — End: 2023-07-04
  Administered 2023-07-04: 2 g via INTRAVENOUS
  Filled 2023-07-04: qty 50

## 2023-07-04 MED ORDER — SODIUM CHLORIDE 0.9% FLUSH
10.0000 mL | Freq: Once | INTRAVENOUS | Status: AC | PRN
  Administered 2023-07-04: 10 mL
  Filled 2023-07-04: qty 10

## 2023-07-04 MED ORDER — SODIUM CHLORIDE 0.9 % IV SOLN
25.0000 mg | Freq: Once | INTRAVENOUS | Status: AC
  Administered 2023-07-04: 25 mg via INTRAVENOUS
  Filled 2023-07-04: qty 1

## 2023-07-04 MED ORDER — MAGNESIUM SULFATE 4 GM/100ML IV SOLN
4.0000 g | Freq: Once | INTRAVENOUS | Status: AC
  Administered 2023-07-04: 4 g via INTRAVENOUS

## 2023-07-04 MED ORDER — IRON SUCROSE 20 MG/ML IV SOLN
200.0000 mg | Freq: Once | INTRAVENOUS | Status: AC
  Administered 2023-07-04: 200 mg via INTRAVENOUS

## 2023-07-04 MED ORDER — SODIUM CHLORIDE 0.9 % IV SOLN
INTRAVENOUS | Status: DC
Start: 2023-07-04 — End: 2023-07-04
  Filled 2023-07-04: qty 250

## 2023-07-10 ENCOUNTER — Inpatient Hospital Stay: Payer: Medicare PPO

## 2023-07-10 VITALS — BP 122/58 | HR 61 | Temp 97.5°F

## 2023-07-10 DIAGNOSIS — I130 Hypertensive heart and chronic kidney disease with heart failure and stage 1 through stage 4 chronic kidney disease, or unspecified chronic kidney disease: Secondary | ICD-10-CM | POA: Diagnosis not present

## 2023-07-10 DIAGNOSIS — D500 Iron deficiency anemia secondary to blood loss (chronic): Secondary | ICD-10-CM | POA: Diagnosis not present

## 2023-07-10 DIAGNOSIS — T451X5A Adverse effect of antineoplastic and immunosuppressive drugs, initial encounter: Secondary | ICD-10-CM | POA: Diagnosis not present

## 2023-07-10 DIAGNOSIS — Z95828 Presence of other vascular implants and grafts: Secondary | ICD-10-CM

## 2023-07-10 DIAGNOSIS — G620 Drug-induced polyneuropathy: Secondary | ICD-10-CM | POA: Diagnosis not present

## 2023-07-10 DIAGNOSIS — C9001 Multiple myeloma in remission: Secondary | ICD-10-CM

## 2023-07-10 DIAGNOSIS — N1832 Chronic kidney disease, stage 3b: Secondary | ICD-10-CM | POA: Diagnosis not present

## 2023-07-10 DIAGNOSIS — E538 Deficiency of other specified B group vitamins: Secondary | ICD-10-CM | POA: Diagnosis not present

## 2023-07-10 DIAGNOSIS — I350 Nonrheumatic aortic (valve) stenosis: Secondary | ICD-10-CM | POA: Diagnosis not present

## 2023-07-10 DIAGNOSIS — E860 Dehydration: Secondary | ICD-10-CM

## 2023-07-10 LAB — MAGNESIUM: Magnesium: 1.2 mg/dL — ABNORMAL LOW (ref 1.7–2.4)

## 2023-07-10 MED ORDER — MAGNESIUM SULFATE 2 GM/50ML IV SOLN
2.0000 g | Freq: Once | INTRAVENOUS | Status: AC
  Administered 2023-07-10: 2 g via INTRAVENOUS
  Filled 2023-07-10: qty 50

## 2023-07-10 MED ORDER — SODIUM CHLORIDE 0.9% FLUSH
10.0000 mL | Freq: Once | INTRAVENOUS | Status: AC
  Administered 2023-07-10: 10 mL via INTRAVENOUS
  Filled 2023-07-10: qty 10

## 2023-07-10 MED ORDER — SODIUM CHLORIDE 0.9 % IV SOLN: Freq: Once | INTRAVENOUS | Status: DC

## 2023-07-10 MED ORDER — MAGNESIUM SULFATE 4 GM/100ML IV SOLN
4.0000 g | Freq: Once | INTRAVENOUS | Status: AC
  Administered 2023-07-10: 4 g via INTRAVENOUS
  Filled 2023-07-10: qty 100

## 2023-07-10 MED ORDER — SODIUM CHLORIDE 0.9 % IV SOLN
25.0000 mg | Freq: Once | INTRAVENOUS | Status: AC
  Administered 2023-07-10: 25 mg via INTRAVENOUS
  Filled 2023-07-10: qty 1

## 2023-07-10 MED ORDER — HEPARIN SOD (PORK) LOCK FLUSH 100 UNIT/ML IV SOLN
500.0000 [IU] | Freq: Once | INTRAVENOUS | Status: AC | PRN
Start: 2023-07-10 — End: 2023-07-10
  Administered 2023-07-10: 500 [IU]
  Filled 2023-07-10: qty 5

## 2023-07-10 MED ORDER — SODIUM CHLORIDE 0.9 % IV SOLN
INTRAVENOUS | Status: AC
Start: 2023-07-10 — End: 2023-07-11
  Filled 2023-07-10: qty 250

## 2023-07-10 MED ORDER — HEPARIN SOD (PORK) LOCK FLUSH 100 UNIT/ML IV SOLN
250.0000 [IU] | Freq: Once | INTRAVENOUS | Status: AC | PRN
  Filled 2023-07-10: qty 5

## 2023-07-17 ENCOUNTER — Inpatient Hospital Stay: Payer: Medicare PPO

## 2023-07-17 ENCOUNTER — Encounter: Payer: Self-pay | Admitting: Oncology

## 2023-07-17 ENCOUNTER — Inpatient Hospital Stay: Payer: Medicare PPO | Admitting: Oncology

## 2023-07-17 VITALS — BP 136/72 | HR 59 | Temp 97.2°F | Resp 18 | Wt 163.0 lb

## 2023-07-17 DIAGNOSIS — D500 Iron deficiency anemia secondary to blood loss (chronic): Secondary | ICD-10-CM

## 2023-07-17 DIAGNOSIS — C9001 Multiple myeloma in remission: Secondary | ICD-10-CM

## 2023-07-17 DIAGNOSIS — G629 Polyneuropathy, unspecified: Secondary | ICD-10-CM | POA: Diagnosis not present

## 2023-07-17 DIAGNOSIS — T451X5A Adverse effect of antineoplastic and immunosuppressive drugs, initial encounter: Secondary | ICD-10-CM | POA: Diagnosis not present

## 2023-07-17 DIAGNOSIS — E538 Deficiency of other specified B group vitamins: Secondary | ICD-10-CM | POA: Diagnosis not present

## 2023-07-17 DIAGNOSIS — G620 Drug-induced polyneuropathy: Secondary | ICD-10-CM | POA: Diagnosis not present

## 2023-07-17 DIAGNOSIS — N1832 Chronic kidney disease, stage 3b: Secondary | ICD-10-CM | POA: Diagnosis not present

## 2023-07-17 DIAGNOSIS — Z9484 Stem cells transplant status: Secondary | ICD-10-CM | POA: Diagnosis not present

## 2023-07-17 DIAGNOSIS — I350 Nonrheumatic aortic (valve) stenosis: Secondary | ICD-10-CM | POA: Diagnosis not present

## 2023-07-17 DIAGNOSIS — I130 Hypertensive heart and chronic kidney disease with heart failure and stage 1 through stage 4 chronic kidney disease, or unspecified chronic kidney disease: Secondary | ICD-10-CM | POA: Diagnosis not present

## 2023-07-17 LAB — CMP (CANCER CENTER ONLY)
ALT: 27 U/L (ref 0–44)
AST: 24 U/L (ref 15–41)
Albumin: 4.2 g/dL (ref 3.5–5.0)
Alkaline Phosphatase: 89 U/L (ref 38–126)
Anion gap: 10 (ref 5–15)
BUN: 35 mg/dL — ABNORMAL HIGH (ref 8–23)
CO2: 23 mmol/L (ref 22–32)
Calcium: 9.2 mg/dL (ref 8.9–10.3)
Chloride: 102 mmol/L (ref 98–111)
Creatinine: 1.38 mg/dL — ABNORMAL HIGH (ref 0.44–1.00)
GFR, Estimated: 42 mL/min — ABNORMAL LOW (ref >60)
Glucose, Bld: 140 mg/dL — ABNORMAL HIGH (ref 70–99)
Potassium: 4.4 mmol/L (ref 3.5–5.1)
Sodium: 135 mmol/L (ref 135–145)
Total Bilirubin: 0.5 mg/dL (ref 0.0–1.2)
Total Protein: 7.1 g/dL (ref 6.5–8.1)

## 2023-07-17 LAB — CBC WITH DIFFERENTIAL (CANCER CENTER ONLY)
Abs Immature Granulocytes: 0.09 10*3/uL — ABNORMAL HIGH (ref 0.00–0.07)
Basophils Absolute: 0 10*3/uL (ref 0.0–0.1)
Basophils Relative: 1 %
Eosinophils Absolute: 0.2 10*3/uL (ref 0.0–0.5)
Eosinophils Relative: 3 %
HCT: 34.8 % — ABNORMAL LOW (ref 36.0–46.0)
Hemoglobin: 11.5 g/dL — ABNORMAL LOW (ref 12.0–15.0)
Immature Granulocytes: 1 %
Lymphocytes Relative: 17 %
Lymphs Abs: 1.1 10*3/uL (ref 0.7–4.0)
MCH: 28.3 pg (ref 26.0–34.0)
MCHC: 33 g/dL (ref 30.0–36.0)
MCV: 85.7 fL (ref 80.0–100.0)
Monocytes Absolute: 0.5 10*3/uL (ref 0.1–1.0)
Monocytes Relative: 8 %
Neutro Abs: 4.4 10*3/uL (ref 1.7–7.7)
Neutrophils Relative %: 70 %
Platelet Count: 126 10*3/uL — ABNORMAL LOW (ref 150–400)
RBC: 4.06 MIL/uL (ref 3.87–5.11)
RDW: 15.5 % (ref 11.5–15.5)
WBC Count: 6.3 10*3/uL (ref 4.0–10.5)
nRBC: 0 % (ref 0.0–0.2)

## 2023-07-17 LAB — MAGNESIUM: Magnesium: 1.3 mg/dL — ABNORMAL LOW (ref 1.7–2.4)

## 2023-07-17 MED ORDER — MAGNESIUM SULFATE 4 GM/100ML IV SOLN
4.0000 g | Freq: Once | INTRAVENOUS | Status: AC
  Administered 2023-07-17: 4 g via INTRAVENOUS
  Filled 2023-07-17: qty 100

## 2023-07-17 MED ORDER — SODIUM CHLORIDE 0.9 % IV SOLN
INTRAVENOUS | Status: DC
Start: 2023-07-17 — End: 2023-07-17
  Filled 2023-07-17: qty 250

## 2023-07-17 MED ORDER — HEPARIN SOD (PORK) LOCK FLUSH 100 UNIT/ML IV SOLN
500.0000 [IU] | Freq: Once | INTRAVENOUS | Status: AC | PRN
Start: 2023-07-17 — End: 2023-07-17
  Administered 2023-07-17: 500 [IU]
  Filled 2023-07-17: qty 5

## 2023-07-17 MED ORDER — MAGNESIUM SULFATE 2 GM/50ML IV SOLN
2.0000 g | Freq: Once | INTRAVENOUS | Status: AC
  Administered 2023-07-17: 2 g via INTRAVENOUS
  Filled 2023-07-17: qty 50

## 2023-07-17 MED ORDER — SODIUM CHLORIDE 0.9 % IV SOLN
25.0000 mg | Freq: Once | INTRAVENOUS | Status: AC
Start: 2023-07-17 — End: 2023-07-17
  Administered 2023-07-17: 25 mg via INTRAVENOUS
  Filled 2023-07-17: qty 1

## 2023-07-17 MED ORDER — SODIUM CHLORIDE 0.9% FLUSH
10.0000 mL | Freq: Once | INTRAVENOUS | Status: AC
  Administered 2023-07-17: 10 mL via INTRAVENOUS
  Filled 2023-07-17: qty 10

## 2023-07-17 MED ORDER — CYANOCOBALAMIN 1000 MCG/ML IJ SOLN
1000.0000 ug | Freq: Once | INTRAMUSCULAR | Status: AC
  Administered 2023-07-17: 1000 ug via INTRAMUSCULAR
  Filled 2023-07-17: qty 1

## 2023-07-17 NOTE — Assessment & Plan Note (Signed)
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  Light chain ratio is normal. 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 Repeat immunofixation negative. . Labs reviewed and discussed with patient, stable M protein and stable light chain ratio Off Daratumumab due to frequent infections  Continue acyclovir prophylaxis.

## 2023-07-17 NOTE — Assessment & Plan Note (Signed)
#  Chronic hypomagnesemia proceed with  IV magnesium today.  Continue weekly magnesium +/- IV magnesium.

## 2023-07-17 NOTE — Assessment & Plan Note (Signed)
B12 level normal.  continue B12 injections Q 6 weeks.

## 2023-07-17 NOTE — Assessment & Plan Note (Signed)
Grade 2 chemotherapy-induced neuropathy, Previously on Lyrica and nortriptyline-not effective Neurology recommend Tramadol and may consider higher dose of  Nortriptyline in the future.  She prefer to be off treatments for now, symptom is stable. Marland Kitchen

## 2023-07-17 NOTE — Assessment & Plan Note (Addendum)
Lab Results  Component Value Date   HGB 11.5 (L) 07/17/2023   TIBC 335 06/05/2023   IRONPCTSAT 7 (L) 06/05/2023   FERRITIN 73 06/05/2023    S/p IV Venofer weekly x 3 Hb has improved.

## 2023-07-17 NOTE — Progress Notes (Signed)
Hematology/Oncology Progress note [phone] [phone]     Chief Complaint: Sarah [name] is a 68 y.o. 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  Light chain ratio is normal. 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 Repeat immunofixation negative. . Labs reviewed and discussed with patient, stable M protein and stable light chain ratio Off Daratumumab due to frequent infections  Continue acyclovir prophylaxis.   Iron deficiency anemia due to chronic blood loss Lab Results  Component Value Date   HGB 11.5 (L) 07/17/2023   TIBC 335 06/05/2023   IRONPCTSAT 7 (L) 06/05/2023   FERRITIN 73 06/05/2023    S/p IV Venofer weekly x 3 Hb has improved.    Neuropathy Grade 2 chemotherapy-induced neuropathy, Previously on Lyrica and nortriptyline-not effective Neurology recommend Tramadol and may consider higher dose of  Nortriptyline in the future.  She prefer to be off treatments for now, symptom is stable. .    B12 deficiency B12 level normal.  continue B12 injections Q 6 weeks.   History of stem cell transplant (HCC) -[facility]- Post transplant RN coordinator [phone]).  Hypomagnesemia #Chronic hypomagnesemia proceed with  IV magnesium today.  Continue weekly magnesium +/- IV magnesium.       Orders Placed This Encounter  Procedures   CMP (Cancer Center only)    Standing Status:   Future    Expected Date:   08/28/2023    Expiration Date:   07/16/2024   CBC with Differential (Cancer Center Only)    Standing Status:   Future     Expected Date:   08/28/2023    Expiration Date:   07/16/2024   Multiple Myeloma Panel (SPEP&IFE w/QIG)    Standing Status:   Future    Expected Date:   08/28/2023    Expiration Date:   07/16/2024   Kappa/lambda light chains    Standing Status:   Future    Expected Date:   08/28/2023    Expiration Date:   07/16/2024   Vitamin B12    Standing Status:   Future    Expected Date:   08/28/2023    Expiration Date:   07/16/2024    Follow up  Lab Mag weekly x5  + IV mag.  6 weeks flex lab MD IV mag,  same labs  All questions were answered. The patient knows to call the clinic with any problems, questions or concerns.  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   Initial bone marrow revealed 80% plasma cells.  Lambda free light chains were 1340.  She had nephrotic range proteinuria.  She initially underwent induction with RVD.  Revlimid maintenance was discontinued on 01/21/2017 secondary to intolerance.     07/19/2019 Bone marrow aspirate and biopsy on  revealed a normocellular marrow with but increased lambda-restricted plasma cells (9% aspirate, 40% CD138 immunohistochemistry).  Findings were consistent with recurrent plasma cell myeloma.  Flow cytometry revealed no monoclonal B-cell or phenotypically aberrant T-cell population. Cytogenetics were 39, XX (normal).  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    Lambda light chains have been followed: 22.2  (ratio 0.56) on 07/03/2017, 30.8 (ratio 0.78) on 09/02/2017, 36.9 (ratio 0.40) on 10/21/2017, 37.4 (ratio 0.41) on 12/16/2017, 70.7(ratio 0.31)  on 02/17/2018, 64.2 (ratio 0.27) on 04/07/2018, 78.9 (ratio 0.18) on 05/26/2018, 128.8 (ratio 0.17) on 08/06/2018, 181.5 (ratio 0.13) on 10/08/2018, 130.9 (ratio 0.13) on 10/20/2018, 160.7 (ratio 0.10) on 12/09/2018, 236.6 (ratio 0.07) on 02/01/2019, 363.6 (ratio 0.04) on 03/22/2019, 404.8 (ratio 0.04) on 04/05/2019, 420.7 (ratio 0.03) on 05/24/2019, 573.4 (ratio 0.03) on 06/23/2019, 451.05 (ratio 0.02) on 08/20/2019, 47.2 (ratio 0.13) on 10/25/2019, 22.4 (ratio 0.21) on 11/25/2019, 16.5 (ratio 0.33) on 01/10/2020, 14.6 (ratio 0.34) on 02/07/2020, 13.1 (ratio 0.31) on 03/07/2020, 10.1 (ratio 0.38) on 04/10/2020, and 9.5 (ratio 0.21) on 06/19/2020.   24 hour UPEP on 06/03/2019 revealed kappa free light chains 95.76, lambda free light chains 1,260.71, and ratio 0.08.  24 hour UPEP on 08/23/2019 revealed total protein of 782 mg/24 hrs with lambda free light chains 1,084.16 mg/L and ratio of 0.10 (1.03-31.76).  M spike in urine was 46.1% (361 mg/24 hrs).    Bone survey on 04/08/2016 and 05/28/2017 revealed no definite lytic lesion seen in the visualized skeleton.  Bone survey on 11/19/2018 revealed no suspicious lucent lesions and no acute bony abnormality.  PET scan on 07/12/2019 revealed no focal metabolic activity to suggest active myeloma within the skeleton. There were no lytic lesions identified on the CT portion of the exam or soft tissue plasmacytomas. There was no evidence of multiple myeloma.    Pretreatment RBC phenotype on 09/23/2019 was positive for C, e, DUFFY B, KIDD B, M, S, and s antigen; negative for c, E, KELL, DUFFY A, KIDD A, and N antigen.    09/27/2019 - 10/25/2019; 12/09/2019 - 03/13/2020 6 cycles of daratumumab and hyaluronidase-fihj, Pomalyst, and Decadron (DPd) .  Cycle #1 was complicated by fever and neutropenia requiring admission.  Cycle #2  was complicated by pneumonia requiring admission.  Cycle #6 was complicated with an ER evaluation for an elevated lactic acid.   05/10/2020 second autologous stem cell transplant at [facility]   She underwent conditioning with melphalan 140 mg/m2.    She is s/p week #3 Velcade maintenance (began 08/31/2020 - 09/28/2020).  She has no increase in neuropathy.  She has severe aortic stenosis.  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 Cardiology is following for a possible TAVR in the future.  Echo on 07/05/2020 revealed moderate to severe aortic stenosis with an EF of 50-55%.  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 Zometa was discontinued in 01/2017.  She has chronic nausea on Phenergan.     B12 deficiency.  B12 was 254 on 04/09/2017, 295 on 08/20/2018, and 391 on 10/08/2018.  She was on oral B12.  She received B12 monthly (last 09/14/2020).  Folate was 12.1 on 01/10/2020.    She has iron deficiency.  Ferritin was 32 on 07/01/2019.  She received Venofer on 07/15/2019 and 07/22/2019.  She has hypogammaglobulinemia.  IgG was 245 on 11/25/2019.  She received monthly IVIG (07/22/021 - 03/22/2020).  She received IVIG 400 mg/kg on 01/20/2020, 200 mg/kg on 02/17/2020, and 300 mg/kg on 03/22/2020.  IVIG on 01/20/2020 was complicated by acute renal failure. IgG trough level was 418 on 02/17/2020.  10/15/2019 - 10/20/2019 She was admitted to [facility] with fever and neutropenia.  Cultures were negative.  CXR was negative. She received broad spectrum antibiotics and daily Granix.  She received IVF for acute renal insufficiency due to diarrhea and dehydration.  Creatine was 1.66 on admission and 1.12 on discharge.    03/01/2020 - 03/05/2020 admitted to [facility] from with fever and neutropenia. CXR revealed no active cardiopulmonary disease. Chest CT with contrast revealed no acute intrathoracic pathology. There were findings  which could be suggestive of prior granulomatous disease. She was treated with Cefepime and Vancomycin, then switched to ciprofloxacin on 03/03/2020.   05/08/2020 - 12/05/2021The patient was admitted to [facility] from  for autologous bone marrow transplant.   She received conditioning with melphalan 140 mg/m2.  Bone marrow transplant was on 05/10/2020. The patient developed fevers daily from 05/19/2020 - 05/24/2020. Blood cultures were + for strept sanguis bacteremia.  She was treated cefepime, vancomycin, and solumedrol for possible engraftment syndrome. Cefepime was switched to ceftriaxone; she completed 2 weeks of ceftriaxone on 06/03/2020. She had chemotherapy induced diarrhea.  She experienced urinary retention.  Feb 2022 patient has been on maintenance Velcade 2 mg every 2 weeks.  Chronic diarrhea seen by gastroenterology Dr. Tobi Bastos and was recommended to avoid artificial sugars in her diet including Diet Coke and coffee mate.  She feels some improvement of her diarrhea frequency since the dietary modification.  GI profile PCR negative, C. difficile toxin A+ B negative, fecal calprotectin 77 12/19/2020 colonoscopy showed 2 subcentimeter polyps in the ascending colon, resected and removed.  Pathology showed tubular adenoma.  No malignancy.  Normal mucosa in the entire examined colon.  Biopsied, pathology showed colonic mucosa with no significant pathological alteration.  Negative for active inflammation and features of chronicity.  Negative for microscopic colitis, dysplasia, and malignanc  Diabetes, patient has discontinued metformin due to diarrhea and started on glipizide 2.5 mg   03/05/2021-03/09/2021 Patient was hospitalized due to fever and chills, nonproductive cough, shortness of breath. X-ray showed right lower lobe atelectasis versus pneumonia.  Blood cultures negative.  Urine culture showed 60,000 colonies of Enterococcus facialis.  Patient received antibiotics. Patient also had abdominal pelvis  CT scan for work-up of intermittent lower abdomen pain.No acute abnormality.   05/14/21 last dose of Velcade maintenance.  establish care with neurology Dr. Theora Master and her neuropathy regimen has been switched from gabapentin to Lyrica. 05/29/2021 patient got influenza   neuropathy medication has been switched from gabapentin/Lyrica to nortriptyline.  08/18/2021 Hospitalized due to pneumonia from metapnuemovirus, hospitalization was complicated with NSTEMI. She received heparin gtt.  Treated with antibiotics for coverage of pneumonia,  diuretics for pulmonary edema. She received IVIG 400mg /kg x 1 for hypoglobulinemia. Discharged on 08/25/21 with Augmentin and Zithromax.   May 2023, Daratumumab being held for [facility] infectious disease physician Dr. Kennedy Bucker due to frequent infection.  Diagnosed with CHF [HFrEF] -NYHA class II-III, she follows up with cardiology, started on entresto  hospitalization due to CHF exacerbation, community acquired pneumonia.   Presented to emergency room on 07/01/2022 Respiratory panel RT-PCR negative for COVID-19, influenza and RSV.   INTERVAL HISTORY Sarah [name] is a 68 y.o. female who has above history reviewed  by me today presents for follow up visit for management of multiple myeloma.  Patient has been on weekly magnesium infusion as needed hypomagnesemia + CHF [nonischemic cardiomyopathy], aortic stenosis, follows up with cardiology. Today her breathing is good.  No new complaints.   Past Medical History:  Diagnosis Date   Anemia    Anxiety    Aortic stenosis    a. 05/2020 Echo: EF 45-50%, sev AS - seen by TAVR team @ [facility] - CTA sugg of tricuspid valve w/ fusing of 2 leaflets-TAVR deferred in setting of acute infxn; b. 07/2020 Echo: EF 50-55%, mod-sev AS; c. 12/2020 Echo: EF>55%. Mod-sev paradoxical low-flow low-gradient AS; d. 08/2021 Echo: EF 35-40%, severe AS, triv AI, mild MR.   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 Variable EF over time; b. 08/2017 Echo: EF 40%; b. 03/2020 Echo: EF 55-60%; c. 05/2020 Echo: EF 45-50%; d. 07/2020 Echo: EF 50-55%; e. 12/2020 Echo: EF>55%; f. 08/2021 Echo: EF 35-40%.   Chronic heart failure with preserved ejection fraction (HFpEF) (HCC)    a. Variable EF over time; b. 08/2017 Echo: EF 40%; b. 03/2020 Echo: EF 55-60%; c. 05/2020 Echo: EF 45-50%; d. 07/2020 Echo: EF 50-55%; e. 12/2020 Echo: EF>55%; f. 08/2021 Echo: EF 35-40%, no RV, mildly dil LA, mild MR, triv AI, severe AS.   Chronic pain syndrome 07/08/2016   CKD (chronic kidney disease) stage 3, GFR 30-59 ml/min (HCC)    Depression    Diabetes mellitus (HCC)    Dizziness    Fatty liver    Frequent falls    GERD (gastroesophageal reflux disease)    Gout    Heart murmur    History of blood transfusion    History of bone marrow transplant (HCC)    History of uterine fibroid    Hx of cardiac catheterization    a. 01/2016 Cath [facility] - after abnl nuc): Nl cors.   Hypertension    Hypomagnesemia    IDA (iron deficiency anemia)    Multiple myeloma (HCC)    NSTEMI (non-ST elevated myocardial infarction) (HCC) 08/21/2021   Personal history of chemotherapy    Pleurisy 04/09/2022   PSVT (paroxysmal supraventricular tachycardia) (HCC)    a. S/p RFCA [facility]).   PVC's (premature ventricular contractions)    a. Well-managed w/ bisoprolol in outpt setting.   Renal cyst     Past Surgical History:  Procedure Laterality Date   ABDOMINAL HYSTERECTOMY     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   CARDIAC ELECTROPHYSIOLOGY MAPPING AND ABLATION     CARPAL TUNNEL RELEASE Bilateral    CHOLECYSTECTOMY  2008   COLONOSCOPY WITH PROPOFOL N/A 05/07/2017   Procedure: COLONOSCOPY WITH PROPOFOL;  Surgeon: Wyline Mood, MD;  Location: [facility] ENDOSCOPY;  Service: Gastroenterology;  Laterality: N/A;   COLONOSCOPY WITH PROPOFOL N/A 12/19/2020    Procedure: COLONOSCOPY WITH PROPOFOL;  Surgeon: Wyline Mood, MD;  Location: [facility] ENDOSCOPY;  Service: Gastroenterology;  Laterality: N/A;   ESOPHAGOGASTRODUODENOSCOPY (EGD) WITH PROPOFOL N/A 05/07/2017   Procedure: ESOPHAGOGASTRODUODENOSCOPY (EGD) WITH PROPOFOL;  Surgeon: Wyline Mood, MD;  Location: [facility] ENDOSCOPY;  Service: Gastroenterology;  Laterality: N/A;   FOOT SURGERY Bilateral    INCONTINENCE SURGERY  2009   INTERSTIM IMPLANT PLACEMENT     other     over active bladder   OTHER SURGICAL HISTORY     bladder stimulator  PARTIAL HYSTERECTOMY  03/1996   fibroids   PORTA CATH INSERTION N/A 03/10/2019   Procedure: PORTA CATH INSERTION;  Surgeon: Annice Needy, MD;  Location: [facility] INVASIVE CV LAB;  Service: Cardiovascular;  Laterality: N/A;   TONSILLECTOMY  2007    Family History  Problem Relation Age of Onset   Colon cancer Father    Renal Disease Father    Diabetes Mellitus II Father    Melanoma Paternal Grandmother    Breast cancer Maternal Aunt 62   Anemia Mother    Heart disease Mother    Heart failure Mother    Renal Disease Mother    Congestive Heart Failure Mother    Heart disease Maternal Uncle    Throat cancer Maternal Uncle    Lung cancer Maternal Uncle    Liver disease Maternal Uncle    Heart failure Maternal Uncle    Hearing loss Son 53       Suicide     Social History:  reports that she quit smoking about 32 years ago. Her smoking use included cigarettes. She started smoking about 52 years ago. She has a 20 pack-year smoking history. She has never used smokeless tobacco. She reports current alcohol use of about 2.0 standard drinks of alcohol per week. She reports that she does not use drugs.  She is on disability. She notes exposure to perchloroethylene [facility]).   Allergies:  Allergies  Allergen Reactions   Oxycodone-Acetaminophen Anaphylaxis    Swelling and rash   Celebrex [Celecoxib] Diarrhea   Codeine    Plerixafor     In 2016 during ASCT collection  patient developed fever to 103.45F and required hospitalization   Benadryl [Diphenhydramine] Palpitations   Morphine Itching and Rash   Ondansetron Diarrhea   Tylenol [Acetaminophen] Itching and Rash    Current Medications: Current Outpatient Medications  Medication Sig Dispense Refill   allopurinol (ZYLOPRIM) 100 MG tablet TAKE 1 TABLET(100 MG) BY MOUTH DAILY as needed 90 tablet 1   amiodarone (PACERONE) 200 MG tablet Take 200 mg by mouth daily.     atorvastatin (LIPITOR) 10 MG tablet Take 0.5 tablets (5 mg total) by mouth daily. 45 tablet 1   bisoprolol (ZEBETA) 10 MG tablet Take 1 tablet (10 mg total) by mouth daily. 90 tablet 1   calcium carbonate (OS-CAL) 1250 (500 Ca) MG chewable tablet Chew 2 tablets by mouth daily.     cholestyramine (QUESTRAN) 4 GM/DOSE powder Take 1 packet (4 g total) by mouth 2 (two) times daily with a meal. 240 packet 2   diazepam (VALIUM) 5 MG tablet Take 0.5-1 tablets (2.5-5 mg total) by mouth at bedtime as needed (insomnia). 30 tablet 3   diclofenac sodium (VOLTAREN) 1 % GEL Apply 2 g topically 4 (four) times daily. 100 g 1   diphenoxylate-atropine (LOMOTIL) 2.5-0.025 MG tablet Take 2 tablets by mouth 4 (four) times daily as needed for diarrhea or loose stools. 180 tablet 3   FLUoxetine (PROZAC) 40 MG capsule Take 1 capsule (40 mg total) by mouth daily. 90 capsule 0   furosemide (LASIX) 40 MG tablet Take 0.5 tablets (20 mg total) by mouth daily. Home dose.     glipiZIDE (GLUCOTROL XL) 2.5 MG 24 hr tablet Take 1 tablet (2.5 mg total) by mouth daily with breakfast. 90 tablet 1   glucose blood test strip      lidocaine (XYLOCAINE) 2 % solution Use as directed 15 mLs in the mouth or throat every 3 (three) hours as needed  for mouth pain (swish and spit). 100 mL 0   losartan (COZAAR) 25 MG tablet Take 1 tablet by mouth daily.     magnesium chloride (SLOW-MAG) 64 MG TBEC SR tablet Take 1 tablet (64 mg total) by mouth daily. 60 tablet 1   Multiple Vitamin  (MULTIVITAMIN) tablet Take 1 tablet by mouth daily.     omeprazole (PRILOSEC) 40 MG capsule TAKE 1 CAPSULE IN THE MORNING AND TAKE 1 CAPSULE AT BEDTIME 180 capsule 3   pentoxifylline (TRENTAL) 400 MG CR tablet Take 400 mg by mouth 3 (three) times daily with meals.     potassium chloride SA (KLOR-CON M) 20 MEQ tablet Take 1 tablet (20 mEq total) by mouth daily. 7 tablet 0   promethazine (PHENERGAN) 25 MG tablet Take 1 tablet (25 mg total) by mouth every 6 (six) hours as needed for nausea or vomiting. 90 tablet 1   spironolactone (ALDACTONE) 25 MG tablet Take by mouth.     tiZANidine (ZANAFLEX) 4 MG tablet Take 1 tablet (4 mg total) by mouth every 6 (six) hours as needed for muscle spasms. 90 tablet 1   traMADol (ULTRAM) 50 MG tablet      traZODone (DESYREL) 100 MG tablet TAKE 1 TABLET AT BEDTIME 90 tablet 3   vitamin E 45 MG (100 UNITS) capsule Take by mouth daily.     No current facility-administered medications for this visit.   Facility-Administered Medications Ordered in Other Visits  Medication Dose Route Frequency Provider Last Rate Last Admin   sodium chloride flush (NS) 0.9 % injection 10 mL  10 mL Intravenous PRN Rickard Patience, MD   10 mL at 04/02/21 0904   sodium chloride flush (NS) 0.9 % injection 10 mL  10 mL Intracatheter Once PRN Rickard Patience, MD        Review of Systems  Constitutional:  Negative for chills, fever, malaise/fatigue and weight loss.  HENT:  Positive for congestion. Negative for sore throat.   Eyes:  Negative for redness.  Respiratory:  Positive for cough. Negative for shortness of breath and wheezing.   Cardiovascular:  Negative for chest pain, palpitations and leg swelling.  Gastrointestinal:  Positive for diarrhea. Negative for abdominal pain, blood in stool, nausea and vomiting.  Genitourinary:  Negative for dysuria.  Musculoskeletal:  Positive for back pain and joint pain. Negative for myalgias.  Skin:  Negative for rash.  Neurological:  Positive for tingling and  sensory change. Negative for dizziness and tremors.  Endo/Heme/Allergies:  Does not bruise/bleed easily.  Psychiatric/Behavioral:  Negative for hallucinations.     Performance status (ECOG): 1  Vitals Blood pressure 136/72, pulse (!) 59, temperature (!) 97.2 F (36.2 C), temperature source Tympanic, resp. rate 18, weight 163 lb (73.9 kg), SpO2 96%.  Physical Exam Constitutional:      General: She is not in acute distress.    Appearance: She is not diaphoretic.  HENT:     Head: Normocephalic and atraumatic.  Eyes:     General: No scleral icterus. Cardiovascular:     Rate and Rhythm: Normal rate.     Heart sounds: Murmur heard.  Pulmonary:     Effort: Pulmonary effort is normal. No respiratory distress.     Breath sounds: Normal breath sounds. No wheezing.  Abdominal:     General: There is no distension.  Musculoskeletal:        General: Normal range of motion.     Cervical back: Normal range of motion and neck supple.  Skin:  General: Skin is warm and dry.     Findings: No erythema.     Comments: Lower extremity cat bite cellulitis has improved.   Neurological:     Mental Status: She is alert and oriented to person, place, and time. Mental status is at baseline.     Motor: No abnormal muscle tone.  Psychiatric:        Mood and Affect: Mood and affect normal.      Laboratory data    Latest Ref Rng & Units 06/05/2023    9:34 AM 04/24/2023    9:52 AM 03/13/2023    8:23 AM  CBC  WBC 4.0 - 10.5 K/uL 7.5  4.9  6.0   Hemoglobin 12.0 - 15.0 g/dL 9.5  16.1  09.6   Hematocrit 36.0 - 46.0 % 28.5  33.0  33.6   Platelets 150 - 400 K/uL 127  127  105       Latest Ref Rng & Units 06/26/2023   10:30 AM 06/19/2023    9:39 AM 06/05/2023    9:34 AM  CMP  Glucose 70 - 99 mg/dL 045  409  811   BUN 8 - 23 mg/dL 42  46  31   Creatinine 0.44 - 1.00 mg/dL 9.14  7.82  9.56   Sodium 135 - 145 mmol/L 135  137  137   Potassium 3.5 - 5.1 mmol/L 3.6  4.0  4.1   Chloride 98 - 111 mmol/L  99  101  105   CO2 22 - 32 mmol/L 24  23  23    Calcium 8.9 - 10.3 mg/dL 8.7  9.0  8.3   Total Protein 6.5 - 8.1 g/dL   6.9   Total Bilirubin <1.2 mg/dL   0.4   Alkaline Phos 38 - 126 U/L   100   AST 15 - 41 U/L   22   ALT 0 - 44 U/L   23

## 2023-07-17 NOTE — Assessment & Plan Note (Signed)
-

## 2023-07-18 LAB — KAPPA/LAMBDA LIGHT CHAINS
Kappa free light chain: 26.5 mg/L — ABNORMAL HIGH (ref 3.3–19.4)
Kappa, lambda light chain ratio: 1.13 (ref 0.26–1.65)
Lambda free light chains: 23.4 mg/L (ref 5.7–26.3)

## 2023-07-24 ENCOUNTER — Inpatient Hospital Stay: Payer: Medicare PPO

## 2023-07-24 VITALS — BP 127/62 | HR 70 | Temp 97.1°F | Resp 20

## 2023-07-24 DIAGNOSIS — N1832 Chronic kidney disease, stage 3b: Secondary | ICD-10-CM | POA: Diagnosis not present

## 2023-07-24 DIAGNOSIS — G620 Drug-induced polyneuropathy: Secondary | ICD-10-CM | POA: Diagnosis not present

## 2023-07-24 DIAGNOSIS — E538 Deficiency of other specified B group vitamins: Secondary | ICD-10-CM | POA: Diagnosis not present

## 2023-07-24 DIAGNOSIS — D500 Iron deficiency anemia secondary to blood loss (chronic): Secondary | ICD-10-CM | POA: Diagnosis not present

## 2023-07-24 DIAGNOSIS — T451X5A Adverse effect of antineoplastic and immunosuppressive drugs, initial encounter: Secondary | ICD-10-CM | POA: Diagnosis not present

## 2023-07-24 DIAGNOSIS — I130 Hypertensive heart and chronic kidney disease with heart failure and stage 1 through stage 4 chronic kidney disease, or unspecified chronic kidney disease: Secondary | ICD-10-CM | POA: Diagnosis not present

## 2023-07-24 DIAGNOSIS — I350 Nonrheumatic aortic (valve) stenosis: Secondary | ICD-10-CM | POA: Diagnosis not present

## 2023-07-24 DIAGNOSIS — C9001 Multiple myeloma in remission: Secondary | ICD-10-CM | POA: Diagnosis not present

## 2023-07-24 DIAGNOSIS — Z95828 Presence of other vascular implants and grafts: Secondary | ICD-10-CM

## 2023-07-24 LAB — MULTIPLE MYELOMA PANEL, SERUM
Albumin SerPl Elph-Mcnc: 3.8 g/dL (ref 2.9–4.4)
Albumin/Glob SerPl: 1.3 (ref 0.7–1.7)
Alpha 1: 0.2 g/dL (ref 0.0–0.4)
Alpha2 Glob SerPl Elph-Mcnc: 1 g/dL (ref 0.4–1.0)
B-Globulin SerPl Elph-Mcnc: 1.1 g/dL (ref 0.7–1.3)
Gamma Glob SerPl Elph-Mcnc: 0.7 g/dL (ref 0.4–1.8)
Globulin, Total: 3 g/dL (ref 2.2–3.9)
IgA: 295 mg/dL (ref 87–352)
IgG (Immunoglobin G), Serum: 737 mg/dL (ref 586–1602)
IgM (Immunoglobulin M), Srm: 35 mg/dL (ref 26–217)
Total Protein ELP: 6.8 g/dL (ref 6.0–8.5)

## 2023-07-24 LAB — MAGNESIUM: Magnesium: 1.2 mg/dL — ABNORMAL LOW (ref 1.7–2.4)

## 2023-07-24 MED ORDER — HEPARIN SOD (PORK) LOCK FLUSH 100 UNIT/ML IV SOLN
500.0000 [IU] | Freq: Once | INTRAVENOUS | Status: AC
  Administered 2023-07-24: 500 [IU] via INTRAVENOUS
  Filled 2023-07-24: qty 5

## 2023-07-24 MED ORDER — MAGNESIUM SULFATE 4 GM/100ML IV SOLN
4.0000 g | Freq: Once | INTRAVENOUS | Status: AC
  Administered 2023-07-24: 4 g via INTRAVENOUS
  Filled 2023-07-24: qty 100

## 2023-07-24 MED ORDER — SODIUM CHLORIDE 0.9% FLUSH
10.0000 mL | INTRAVENOUS | Status: DC | PRN
  Administered 2023-07-24: 10 mL via INTRAVENOUS
  Filled 2023-07-24: qty 10

## 2023-07-24 MED ORDER — MAGNESIUM SULFATE 2 GM/50ML IV SOLN
2.0000 g | Freq: Once | INTRAVENOUS | Status: AC
  Administered 2023-07-24: 2 g via INTRAVENOUS
  Filled 2023-07-24: qty 50

## 2023-07-24 MED ORDER — SODIUM CHLORIDE 0.9 % IV SOLN
25.0000 mg | Freq: Once | INTRAVENOUS | Status: AC
  Administered 2023-07-24: 25 mg via INTRAVENOUS
  Filled 2023-07-24: qty 1

## 2023-07-24 MED ORDER — SODIUM CHLORIDE 0.9 % IV SOLN
INTRAVENOUS | Status: DC
  Filled 2023-07-24: qty 250

## 2023-07-25 ENCOUNTER — Encounter: Payer: Self-pay | Admitting: Podiatry

## 2023-07-25 ENCOUNTER — Ambulatory Visit (INDEPENDENT_AMBULATORY_CARE_PROVIDER_SITE_OTHER): Payer: Medicare PPO | Admitting: Podiatry

## 2023-07-25 VITALS — Ht 63.0 in | Wt 163.0 lb

## 2023-07-25 DIAGNOSIS — M79674 Pain in right toe(s): Secondary | ICD-10-CM | POA: Diagnosis not present

## 2023-07-25 DIAGNOSIS — M79675 Pain in left toe(s): Secondary | ICD-10-CM

## 2023-07-25 DIAGNOSIS — B351 Tinea unguium: Secondary | ICD-10-CM | POA: Diagnosis not present

## 2023-07-25 DIAGNOSIS — S91116A Laceration without foreign body of unspecified lesser toe(s) without damage to nail, initial encounter: Secondary | ICD-10-CM | POA: Diagnosis not present

## 2023-07-25 NOTE — Progress Notes (Signed)
  Subjective:  Patient ID: Sarah [name], female    DOB: May 15, 1956,  MRN: [id number]  68 y.o. female presents painful elongated mycotic toenails 1-5 bilaterally which are tender when wearing enclosed shoe gear. Pain is relieved with periodic professional debridement. She states she noticed some blood on her left 2nd and 3rd toes. Chief Complaint  Patient presents with   Nail Problem    Pt is here for [facility] last A1C was 6.1 PCP is Dr Judithann Graves and LOV was in December.    PCP is [name], MD.  Allergies  Allergen Reactions   Oxycodone-Acetaminophen Anaphylaxis    Swelling and rash   Celebrex [Celecoxib] Diarrhea   Codeine    Plerixafor     In 2016 during ASCT collection patient developed fever to 103.33F and required hospitalization   Benadryl [Diphenhydramine] Palpitations   Morphine Itching and Rash   Ondansetron Diarrhea   Tylenol [Acetaminophen] Itching and Rash   Review of Systems: Negative except as noted in the HPI.   Objective:  Sarah [name] is a pleasant 68 y.o. female WD, WN in NAD. AAO x 3.  Vascular Examination: Vascular status intact b/l with palpable pedal pulses. CFT immediate b/l. Pedal hair present. No edema. No pain with calf compression b/l. Skin temperature gradient WNL b/l. No varicosities noted. No cyanosis or clubbing noted.  Neurological Examination: Sensation grossly intact b/l with 10 gram monofilament. Vibratory sensation intact b/l.  Dermatological Examination: Small laceration noted lateral aspect of left 2nd toe. No erythema, no edema, no purulence. There is fresh heme.Pedal skin noted to be dry b/l feet. No interdigital macerations noted. Toenails 1-5 b/l thick, discolored, elongated with subungual debris and pain on dorsal palpation. No hyperkeratotic lesions noted b/l.   Musculoskeletal Examination: Muscle strength 5/5 to b/l LE.  No pain, crepitus noted b/l. No gross pedal deformities. Patient ambulates independently without assistive  aids.   Radiographs: None  Last A1c:      Latest Ref Rng & Units 06/02/2023    4:58 PM 12/11/2022    8:52 AM  Hemoglobin A1C  Hemoglobin-A1c 4.0 - 5.6 % 6.2  6.5      Assessment:   1. Pain due to onychomycosis of toenails of both feet   2. Laceration of second toe    Plan:  -Consent given for treatment as described below: -Examined patient. -Cleansed left 2nd digit with alcohol. Triple antibiotic ointment and band-aid applied. Patient instructed to apply antibiotic cream once daily until healed. Call office if she has any problems. -Continue supportive shoe gear daily. -Mycotic toenails 1-5 bilaterally were debrided in length and girth with sterile nail nippers and dremel without incident. -For dry skin, recommended daily use of Bag Balm Hand and Body Moistuizer which may be purchased at local drug store or on [employer]. -Patient/POA to call should there be question/concern in the interim.  Return in about 3 months (around 10/23/2023).  Freddie Breech, DPM      [facility] LOCATION: [address] [place] [address]                   Office [phone]   [facility] LOCATION: [address] [place], [place] [address] Office [phone]

## 2023-07-30 DIAGNOSIS — E041 Nontoxic single thyroid nodule: Secondary | ICD-10-CM | POA: Diagnosis not present

## 2023-07-30 DIAGNOSIS — K219 Gastro-esophageal reflux disease without esophagitis: Secondary | ICD-10-CM | POA: Diagnosis not present

## 2023-07-30 DIAGNOSIS — H6121 Impacted cerumen, right ear: Secondary | ICD-10-CM | POA: Diagnosis not present

## 2023-07-30 DIAGNOSIS — H900 Conductive hearing loss, bilateral: Secondary | ICD-10-CM | POA: Diagnosis not present

## 2023-07-30 DIAGNOSIS — H6981 Other specified disorders of Eustachian tube, right ear: Secondary | ICD-10-CM | POA: Diagnosis not present

## 2023-07-31 ENCOUNTER — Inpatient Hospital Stay: Payer: Medicare PPO

## 2023-07-31 DIAGNOSIS — Z79899 Other long term (current) drug therapy: Secondary | ICD-10-CM | POA: Diagnosis not present

## 2023-07-31 DIAGNOSIS — Z5181 Encounter for therapeutic drug level monitoring: Secondary | ICD-10-CM | POA: Diagnosis not present

## 2023-07-31 DIAGNOSIS — I350 Nonrheumatic aortic (valve) stenosis: Secondary | ICD-10-CM | POA: Diagnosis not present

## 2023-07-31 DIAGNOSIS — I493 Ventricular premature depolarization: Secondary | ICD-10-CM | POA: Diagnosis not present

## 2023-07-31 DIAGNOSIS — I428 Other cardiomyopathies: Secondary | ICD-10-CM | POA: Diagnosis not present

## 2023-07-31 DIAGNOSIS — I5022 Chronic systolic (congestive) heart failure: Secondary | ICD-10-CM | POA: Diagnosis not present

## 2023-08-01 ENCOUNTER — Inpatient Hospital Stay: Payer: Medicare PPO

## 2023-08-01 VITALS — BP 152/82 | HR 72 | Temp 97.8°F | Resp 18

## 2023-08-01 DIAGNOSIS — E538 Deficiency of other specified B group vitamins: Secondary | ICD-10-CM | POA: Diagnosis not present

## 2023-08-01 DIAGNOSIS — G620 Drug-induced polyneuropathy: Secondary | ICD-10-CM | POA: Diagnosis not present

## 2023-08-01 DIAGNOSIS — I130 Hypertensive heart and chronic kidney disease with heart failure and stage 1 through stage 4 chronic kidney disease, or unspecified chronic kidney disease: Secondary | ICD-10-CM | POA: Diagnosis not present

## 2023-08-01 DIAGNOSIS — Z95828 Presence of other vascular implants and grafts: Secondary | ICD-10-CM

## 2023-08-01 DIAGNOSIS — C9001 Multiple myeloma in remission: Secondary | ICD-10-CM | POA: Diagnosis not present

## 2023-08-01 DIAGNOSIS — D500 Iron deficiency anemia secondary to blood loss (chronic): Secondary | ICD-10-CM | POA: Diagnosis not present

## 2023-08-01 DIAGNOSIS — N1832 Chronic kidney disease, stage 3b: Secondary | ICD-10-CM | POA: Diagnosis not present

## 2023-08-01 DIAGNOSIS — T451X5A Adverse effect of antineoplastic and immunosuppressive drugs, initial encounter: Secondary | ICD-10-CM | POA: Diagnosis not present

## 2023-08-01 DIAGNOSIS — I350 Nonrheumatic aortic (valve) stenosis: Secondary | ICD-10-CM | POA: Diagnosis not present

## 2023-08-01 LAB — MAGNESIUM: Magnesium: 1.2 mg/dL — ABNORMAL LOW (ref 1.7–2.4)

## 2023-08-01 MED ORDER — PROMETHAZINE HCL 25 MG/ML IJ SOLN
25.0000 mg | Freq: Once | INTRAMUSCULAR | Status: AC
  Administered 2023-08-01: 25 mg via INTRAVENOUS
  Filled 2023-08-01: qty 1

## 2023-08-01 MED ORDER — MAGNESIUM SULFATE 4 GM/100ML IV SOLN
4.0000 g | Freq: Once | INTRAVENOUS | Status: AC
  Administered 2023-08-01: 4 g via INTRAVENOUS
  Filled 2023-08-01: qty 100

## 2023-08-01 MED ORDER — MAGNESIUM SULFATE 2 GM/50ML IV SOLN
2.0000 g | Freq: Once | INTRAVENOUS | Status: AC
  Administered 2023-08-01: 2 g via INTRAVENOUS
  Filled 2023-08-01: qty 50

## 2023-08-01 MED ORDER — SODIUM CHLORIDE 0.9 % IV SOLN
INTRAVENOUS | Status: DC
  Filled 2023-08-01: qty 250

## 2023-08-01 MED ORDER — HEPARIN SOD (PORK) LOCK FLUSH 100 UNIT/ML IV SOLN
500.0000 [IU] | Freq: Once | INTRAVENOUS | Status: AC
Start: 2023-08-01 — End: 2023-08-01
  Administered 2023-08-01: 500 [IU] via INTRAVENOUS
  Filled 2023-08-01: qty 5

## 2023-08-01 NOTE — Patient Instructions (Signed)
 Magnesium Sulfate Injection What is this medication? MAGNESIUM SULFATE (mag NEE zee um SUL fate) prevents and treats low levels of magnesium in your body. It may also be used to prevent and treat seizures during pregnancy in people with high blood pressure disorders, such as preeclampsia or eclampsia. Magnesium plays an important role in maintaining the health of your muscles and nervous system. This medicine may be used for other purposes; ask your health care provider or pharmacist if you have questions. What should I tell my care team before I take this medication? They need to know if you have any of these conditions: Heart disease History of irregular heart beat Kidney disease An unusual or allergic reaction to magnesium sulfate, medications, foods, dyes, or preservatives Pregnant or trying to get pregnant Breast-feeding How should I use this medication? This medication is for infusion into a vein. It is given in a hospital or clinic setting. Talk to your care team about the use of this medication in children. While this medication may be prescribed for selected conditions, precautions do apply. Overdosage: If you think you have taken too much of this medicine contact a poison control center or emergency room at once. NOTE: This medicine is only for you. Do not share this medicine with others. What if I miss a dose? This does not apply. What may interact with this medication? Certain medications for anxiety or sleep Certain medications for seizures, such phenobarbital Digoxin Medications that relax muscles for surgery Narcotic medications for pain This list may not describe all possible interactions. Give your health care provider a list of all the medicines, herbs, non-prescription drugs, or dietary supplements you use. Also tell them if you smoke, drink alcohol, or use illegal drugs. Some items may interact with your medicine. What should I watch for while using this  medication? Your condition will be monitored carefully while you are receiving this medication. You may need blood work done while you are receiving this medication. What side effects may I notice from receiving this medication? Side effects that you should report to your care team as soon as possible: Allergic reactions--skin rash, itching, hives, swelling of the face, lips, tongue, or throat High magnesium level--confusion, drowsiness, facial flushing, redness, sweating, muscle weakness, fast or irregular heartbeat, trouble breathing Low blood pressure--dizziness, feeling faint or lightheaded, blurry vision Side effects that usually do not require medical attention (report to your care team if they continue or are bothersome): Headache Nausea This list may not describe all possible side effects. Call your doctor for medical advice about side effects. You may report side effects to FDA at 1-800-FDA-1088. Where should I keep my medication? This medication is given in a hospital or clinic and will not be stored at home. NOTE: This sheet is a summary. It may not cover all possible information. If you have questions about this medicine, talk to your doctor, pharmacist, or health care provider.  2024 Elsevier/Gold Standard (2021-02-28 00:00:00)

## 2023-08-07 ENCOUNTER — Inpatient Hospital Stay: Payer: Medicare PPO

## 2023-08-07 ENCOUNTER — Inpatient Hospital Stay: Payer: Medicare PPO | Attending: Oncology

## 2023-08-07 VITALS — BP 131/98 | HR 66 | Temp 97.9°F | Resp 18

## 2023-08-07 DIAGNOSIS — I429 Cardiomyopathy, unspecified: Secondary | ICD-10-CM | POA: Diagnosis not present

## 2023-08-07 DIAGNOSIS — C9001 Multiple myeloma in remission: Secondary | ICD-10-CM

## 2023-08-07 DIAGNOSIS — K219 Gastro-esophageal reflux disease without esophagitis: Secondary | ICD-10-CM | POA: Diagnosis not present

## 2023-08-07 DIAGNOSIS — E1122 Type 2 diabetes mellitus with diabetic chronic kidney disease: Secondary | ICD-10-CM | POA: Insufficient documentation

## 2023-08-07 DIAGNOSIS — Z801 Family history of malignant neoplasm of trachea, bronchus and lung: Secondary | ICD-10-CM | POA: Insufficient documentation

## 2023-08-07 DIAGNOSIS — D509 Iron deficiency anemia, unspecified: Secondary | ICD-10-CM | POA: Diagnosis not present

## 2023-08-07 DIAGNOSIS — Z79899 Other long term (current) drug therapy: Secondary | ICD-10-CM | POA: Diagnosis not present

## 2023-08-07 DIAGNOSIS — Z885 Allergy status to narcotic agent status: Secondary | ICD-10-CM | POA: Insufficient documentation

## 2023-08-07 DIAGNOSIS — I130 Hypertensive heart and chronic kidney disease with heart failure and stage 1 through stage 4 chronic kidney disease, or unspecified chronic kidney disease: Secondary | ICD-10-CM | POA: Insufficient documentation

## 2023-08-07 DIAGNOSIS — Z822 Family history of deafness and hearing loss: Secondary | ICD-10-CM | POA: Diagnosis not present

## 2023-08-07 DIAGNOSIS — Z8379 Family history of other diseases of the digestive system: Secondary | ICD-10-CM | POA: Insufficient documentation

## 2023-08-07 DIAGNOSIS — T451X5A Adverse effect of antineoplastic and immunosuppressive drugs, initial encounter: Secondary | ICD-10-CM | POA: Diagnosis not present

## 2023-08-07 DIAGNOSIS — Z833 Family history of diabetes mellitus: Secondary | ICD-10-CM | POA: Insufficient documentation

## 2023-08-07 DIAGNOSIS — I350 Nonrheumatic aortic (valve) stenosis: Secondary | ICD-10-CM | POA: Insufficient documentation

## 2023-08-07 DIAGNOSIS — Z87891 Personal history of nicotine dependence: Secondary | ICD-10-CM | POA: Diagnosis not present

## 2023-08-07 DIAGNOSIS — Z886 Allergy status to analgesic agent status: Secondary | ICD-10-CM | POA: Diagnosis not present

## 2023-08-07 DIAGNOSIS — I252 Old myocardial infarction: Secondary | ICD-10-CM | POA: Insufficient documentation

## 2023-08-07 DIAGNOSIS — N1832 Chronic kidney disease, stage 3b: Secondary | ICD-10-CM | POA: Insufficient documentation

## 2023-08-07 DIAGNOSIS — Z888 Allergy status to other drugs, medicaments and biological substances status: Secondary | ICD-10-CM | POA: Insufficient documentation

## 2023-08-07 DIAGNOSIS — I5032 Chronic diastolic (congestive) heart failure: Secondary | ICD-10-CM | POA: Diagnosis not present

## 2023-08-07 DIAGNOSIS — Z841 Family history of disorders of kidney and ureter: Secondary | ICD-10-CM | POA: Insufficient documentation

## 2023-08-07 DIAGNOSIS — Z832 Family history of diseases of the blood and blood-forming organs and certain disorders involving the immune mechanism: Secondary | ICD-10-CM | POA: Insufficient documentation

## 2023-08-07 DIAGNOSIS — E538 Deficiency of other specified B group vitamins: Secondary | ICD-10-CM | POA: Diagnosis not present

## 2023-08-07 DIAGNOSIS — Z8249 Family history of ischemic heart disease and other diseases of the circulatory system: Secondary | ICD-10-CM | POA: Insufficient documentation

## 2023-08-07 DIAGNOSIS — G620 Drug-induced polyneuropathy: Secondary | ICD-10-CM | POA: Diagnosis not present

## 2023-08-07 DIAGNOSIS — Z9221 Personal history of antineoplastic chemotherapy: Secondary | ICD-10-CM | POA: Insufficient documentation

## 2023-08-07 DIAGNOSIS — Z9049 Acquired absence of other specified parts of digestive tract: Secondary | ICD-10-CM | POA: Insufficient documentation

## 2023-08-07 DIAGNOSIS — Z90710 Acquired absence of both cervix and uterus: Secondary | ICD-10-CM | POA: Diagnosis not present

## 2023-08-07 DIAGNOSIS — Z803 Family history of malignant neoplasm of breast: Secondary | ICD-10-CM | POA: Insufficient documentation

## 2023-08-07 DIAGNOSIS — Z800 Family history of malignant neoplasm of digestive organs: Secondary | ICD-10-CM | POA: Insufficient documentation

## 2023-08-07 LAB — MAGNESIUM: Magnesium: 1.3 mg/dL — ABNORMAL LOW (ref 1.7–2.4)

## 2023-08-07 MED ORDER — MAGNESIUM SULFATE 2 GM/50ML IV SOLN
2.0000 g | Freq: Once | INTRAVENOUS | Status: AC
  Administered 2023-08-07: 2 g via INTRAVENOUS
  Filled 2023-08-07: qty 50

## 2023-08-07 MED ORDER — SODIUM CHLORIDE 0.9 % IV SOLN
25.0000 mg | Freq: Once | INTRAVENOUS | Status: AC
  Administered 2023-08-07: 25 mg via INTRAVENOUS
  Filled 2023-08-07: qty 1

## 2023-08-07 MED ORDER — HEPARIN SOD (PORK) LOCK FLUSH 100 UNIT/ML IV SOLN
500.0000 [IU] | Freq: Once | INTRAVENOUS | Status: AC | PRN
  Administered 2023-08-07: 500 [IU]
  Filled 2023-08-07: qty 5

## 2023-08-07 MED ORDER — SODIUM CHLORIDE 0.9 % IV SOLN
INTRAVENOUS | Status: DC
  Filled 2023-08-07: qty 250

## 2023-08-07 MED ORDER — MAGNESIUM SULFATE 4 GM/100ML IV SOLN
4.0000 g | Freq: Once | INTRAVENOUS | Status: AC
  Administered 2023-08-07: 4 g via INTRAVENOUS
  Filled 2023-08-07: qty 100

## 2023-08-08 DIAGNOSIS — I350 Nonrheumatic aortic (valve) stenosis: Secondary | ICD-10-CM | POA: Diagnosis not present

## 2023-08-08 DIAGNOSIS — R5383 Other fatigue: Secondary | ICD-10-CM | POA: Diagnosis not present

## 2023-08-14 ENCOUNTER — Inpatient Hospital Stay: Payer: Medicare PPO

## 2023-08-14 VITALS — BP 106/55 | HR 57 | Temp 97.8°F | Resp 16

## 2023-08-14 DIAGNOSIS — G620 Drug-induced polyneuropathy: Secondary | ICD-10-CM | POA: Diagnosis not present

## 2023-08-14 DIAGNOSIS — I5032 Chronic diastolic (congestive) heart failure: Secondary | ICD-10-CM | POA: Diagnosis not present

## 2023-08-14 DIAGNOSIS — D509 Iron deficiency anemia, unspecified: Secondary | ICD-10-CM | POA: Diagnosis not present

## 2023-08-14 DIAGNOSIS — C9001 Multiple myeloma in remission: Secondary | ICD-10-CM

## 2023-08-14 DIAGNOSIS — Z95828 Presence of other vascular implants and grafts: Secondary | ICD-10-CM

## 2023-08-14 DIAGNOSIS — I130 Hypertensive heart and chronic kidney disease with heart failure and stage 1 through stage 4 chronic kidney disease, or unspecified chronic kidney disease: Secondary | ICD-10-CM | POA: Diagnosis not present

## 2023-08-14 DIAGNOSIS — T451X5A Adverse effect of antineoplastic and immunosuppressive drugs, initial encounter: Secondary | ICD-10-CM | POA: Diagnosis not present

## 2023-08-14 DIAGNOSIS — E538 Deficiency of other specified B group vitamins: Secondary | ICD-10-CM | POA: Diagnosis not present

## 2023-08-14 DIAGNOSIS — N1832 Chronic kidney disease, stage 3b: Secondary | ICD-10-CM | POA: Diagnosis not present

## 2023-08-14 DIAGNOSIS — E1122 Type 2 diabetes mellitus with diabetic chronic kidney disease: Secondary | ICD-10-CM | POA: Diagnosis not present

## 2023-08-14 LAB — MAGNESIUM: Magnesium: 1.4 mg/dL — ABNORMAL LOW (ref 1.7–2.4)

## 2023-08-14 MED ORDER — SODIUM CHLORIDE 0.9% FLUSH
10.0000 mL | INTRAVENOUS | Status: DC | PRN
Start: 2023-08-14 — End: 2023-08-14
  Administered 2023-08-14: 10 mL via INTRAVENOUS
  Filled 2023-08-14: qty 10

## 2023-08-14 MED ORDER — SODIUM CHLORIDE 0.9 % IV SOLN
INTRAVENOUS | Status: DC
  Filled 2023-08-14: qty 250

## 2023-08-14 MED ORDER — HEPARIN SOD (PORK) LOCK FLUSH 100 UNIT/ML IV SOLN
500.0000 [IU] | Freq: Once | INTRAVENOUS | Status: DC
  Filled 2023-08-14: qty 5

## 2023-08-14 MED ORDER — MAGNESIUM SULFATE 4 GM/100ML IV SOLN
4.0000 g | Freq: Once | INTRAVENOUS | Status: AC
  Administered 2023-08-14: 4 g via INTRAVENOUS
  Filled 2023-08-14: qty 100

## 2023-08-14 MED ORDER — SODIUM CHLORIDE 0.9 % IV SOLN
25.0000 mg | Freq: Once | INTRAVENOUS | Status: AC
  Administered 2023-08-14: 25 mg via INTRAVENOUS
  Filled 2023-08-14: qty 1

## 2023-08-14 MED ORDER — HEPARIN SOD (PORK) LOCK FLUSH 100 UNIT/ML IV SOLN
500.0000 [IU] | Freq: Once | INTRAVENOUS | Status: AC
Start: 2023-08-14 — End: 2023-08-14
  Administered 2023-08-14: 500 [IU] via INTRAVENOUS
  Filled 2023-08-14: qty 5

## 2023-08-19 DIAGNOSIS — R5383 Other fatigue: Secondary | ICD-10-CM | POA: Diagnosis not present

## 2023-08-19 DIAGNOSIS — I350 Nonrheumatic aortic (valve) stenosis: Secondary | ICD-10-CM | POA: Diagnosis not present

## 2023-08-21 ENCOUNTER — Inpatient Hospital Stay: Payer: Medicare PPO

## 2023-08-25 ENCOUNTER — Inpatient Hospital Stay: Payer: Medicare PPO

## 2023-08-25 VITALS — BP 126/64 | HR 56 | Temp 96.0°F | Resp 17

## 2023-08-25 DIAGNOSIS — G620 Drug-induced polyneuropathy: Secondary | ICD-10-CM | POA: Diagnosis not present

## 2023-08-25 DIAGNOSIS — E1122 Type 2 diabetes mellitus with diabetic chronic kidney disease: Secondary | ICD-10-CM | POA: Diagnosis not present

## 2023-08-25 DIAGNOSIS — T451X5A Adverse effect of antineoplastic and immunosuppressive drugs, initial encounter: Secondary | ICD-10-CM | POA: Diagnosis not present

## 2023-08-25 DIAGNOSIS — C9001 Multiple myeloma in remission: Secondary | ICD-10-CM | POA: Diagnosis not present

## 2023-08-25 DIAGNOSIS — D509 Iron deficiency anemia, unspecified: Secondary | ICD-10-CM | POA: Diagnosis not present

## 2023-08-25 DIAGNOSIS — I5032 Chronic diastolic (congestive) heart failure: Secondary | ICD-10-CM | POA: Diagnosis not present

## 2023-08-25 DIAGNOSIS — E538 Deficiency of other specified B group vitamins: Secondary | ICD-10-CM | POA: Diagnosis not present

## 2023-08-25 DIAGNOSIS — N1832 Chronic kidney disease, stage 3b: Secondary | ICD-10-CM | POA: Diagnosis not present

## 2023-08-25 DIAGNOSIS — I130 Hypertensive heart and chronic kidney disease with heart failure and stage 1 through stage 4 chronic kidney disease, or unspecified chronic kidney disease: Secondary | ICD-10-CM | POA: Diagnosis not present

## 2023-08-25 LAB — MAGNESIUM: Magnesium: 1.2 mg/dL — ABNORMAL LOW (ref 1.7–2.4)

## 2023-08-25 MED ORDER — HEPARIN SOD (PORK) LOCK FLUSH 100 UNIT/ML IV SOLN
500.0000 [IU] | Freq: Once | INTRAVENOUS | Status: AC
Start: 2023-08-25 — End: 2023-08-25
  Administered 2023-08-25: 500 [IU] via INTRAVENOUS
  Filled 2023-08-25: qty 5

## 2023-08-25 MED ORDER — SODIUM CHLORIDE 0.9 % IV SOLN
INTRAVENOUS | Status: DC
  Filled 2023-08-25: qty 250

## 2023-08-25 MED ORDER — MAGNESIUM SULFATE 2 GM/50ML IV SOLN
2.0000 g | Freq: Once | INTRAVENOUS | Status: AC
  Administered 2023-08-25: 2 g via INTRAVENOUS
  Filled 2023-08-25: qty 50

## 2023-08-25 MED ORDER — SODIUM CHLORIDE 0.9 % IV SOLN
25.0000 mg | Freq: Once | INTRAVENOUS | Status: AC
  Administered 2023-08-25: 25 mg via INTRAVENOUS
  Filled 2023-08-25: qty 1

## 2023-08-25 MED ORDER — HEPARIN SOD (PORK) LOCK FLUSH 100 UNIT/ML IV SOLN
250.0000 [IU] | Freq: Once | INTRAVENOUS | Status: DC | PRN
Start: 2023-08-25 — End: 2023-08-25
  Filled 2023-08-25: qty 5

## 2023-08-25 MED ORDER — SODIUM CHLORIDE 0.9% FLUSH
10.0000 mL | Freq: Once | INTRAVENOUS | Status: AC
  Administered 2023-08-25: 10 mL via INTRAVENOUS
  Filled 2023-08-25: qty 10

## 2023-08-25 MED ORDER — MAGNESIUM SULFATE 4 GM/100ML IV SOLN
4.0000 g | Freq: Once | INTRAVENOUS | Status: AC
  Administered 2023-08-25: 4 g via INTRAVENOUS
  Filled 2023-08-25: qty 100

## 2023-08-26 ENCOUNTER — Encounter: Payer: Self-pay | Admitting: Internal Medicine

## 2023-08-27 ENCOUNTER — Other Ambulatory Visit: Payer: Self-pay

## 2023-08-27 ENCOUNTER — Other Ambulatory Visit: Payer: Self-pay | Admitting: Internal Medicine

## 2023-08-27 DIAGNOSIS — E118 Type 2 diabetes mellitus with unspecified complications: Secondary | ICD-10-CM

## 2023-08-27 DIAGNOSIS — Z13820 Encounter for screening for osteoporosis: Secondary | ICD-10-CM

## 2023-08-27 MED ORDER — GLIPIZIDE ER 2.5 MG PO TB24: 2.5000 mg | ORAL_TABLET | Freq: Every day | ORAL | 1 refills | Status: DC

## 2023-08-27 NOTE — Progress Notes (Unsigned)
 Date:  08/27/2023   Name:  Sarah [name]   DOB:  01/06/1956   MRN:  [id number]   Chief Complaint: No chief complaint on file.  HPI  Review of Systems   Lab Results  Component Value Date   NA 135 07/17/2023   K 4.4 07/17/2023   CO2 23 07/17/2023   GLUCOSE 140 (H) 07/17/2023   BUN 35 (H) 07/17/2023   CREATININE 1.38 (H) 07/17/2023   CALCIUM 9.2 07/17/2023   GFRNONAA 42 (L) 07/17/2023   Lab Results  Component Value Date   CHOL 164 12/11/2022   HDL 67 12/11/2022   LDLCALC 83 12/11/2022   TRIG 68 12/11/2022   CHOLHDL 2.4 12/11/2022   Lab Results  Component Value Date   TSH 1.450 07/22/2022   Lab Results  Component Value Date   HGBA1C 6.2 (A) 06/02/2023   Lab Results  Component Value Date   WBC 6.3 07/17/2023   HGB 11.5 (L) 07/17/2023   HCT 34.8 (L) 07/17/2023   MCV 85.7 07/17/2023   PLT 126 (L) 07/17/2023   Lab Results  Component Value Date   ALT 27 07/17/2023   AST 24 07/17/2023   ALKPHOS 89 07/17/2023   BILITOT 0.5 07/17/2023   Lab Results  Component Value Date   VD25OH 25.64 (L) 12/11/2022     Patient Active Problem List   Diagnosis Date Noted   Jaw pain 03/13/2023   Prolonged QT interval 03/07/2023   Vitamin D deficiency 12/09/2022   Diabetes mellitus treated with oral medication (HCC) 12/09/2022   NICM (nonischemic cardiomyopathy) (HCC) 08/05/2022   Stage 3b chronic kidney disease (CKD) (HCC) 04/09/2022   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   Overweight (BMI 25.0-29.9) 02/04/2022   Complete left bundle branch block 09/06/2021   Anemia of chronic renal failure, stage 3a (HCC) 09/06/2021   Gastroesophageal reflux disease without esophagitis 09/06/2021   Chronic systolic CHF (congestive heart failure) (HCC) 08/22/2021   Low immunoglobulin level    URI (upper respiratory infection) 06/23/2021   Multiple myeloma in remission (HCC) 05/14/2021   Neuropathy 04/02/2021   Frequent PVCs 12/04/2020   Coronary artery disease involving native coronary  artery of native heart 04/26/2020   Hypokalemia 03/11/2020   Physical deconditioning 02/07/2020   Impaired nutrition 02/07/2020   S/P autologous bone marrow transplantation (HCC) 09/10/2019   Hypocalcemia 08/28/2019   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   Hyperlipidemia associated with type 2 diabetes mellitus (HCC) 11/25/2018   Hypomagnesemia 08/20/2018   Goals of care, counseling/discussion 08/20/2018   B12 deficiency 08/20/2018   Thrombocytopenia (HCC) 02/09/2018   Benign essential HTN 08/21/2017   Carotid artery stenosis, asymptomatic, bilateral 08/06/2017   Thyroid nodule 08/06/2017   Iron deficiency anemia due to chronic blood loss 05/13/2017   Spinal stenosis of lumbar region with neurogenic claudication 04/09/2017   Long term prescription opiate use 09/07/2016   Pain medication agreement signed 07/08/2016   Spondylosis of lumbar region without myelopathy or radiculopathy 07/08/2016   Nonrheumatic aortic valve stenosis 06/05/2016   Environmental and seasonal allergies 04/19/2016   Insomnia 04/19/2016   Asthma 04/19/2016   Aortic regurgitation 04/19/2016   Type II diabetes mellitus with complication (HCC) 04/12/2016   Depression, major, single episode, in partial remission (HCC) 04/12/2016   Irritable bowel syndrome (IBS) 04/12/2016   Hepatic steatosis 04/12/2016   Multiple myeloma in remission (HCC) 04/02/2016   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HCC) 07/06/2015   History of stem cell transplant (HCC) 05/16/2015    Allergies  Allergen Reactions   Oxycodone-Acetaminophen Anaphylaxis    Swelling and rash   Celebrex [Celecoxib] Diarrhea   Codeine    Plerixafor     In 2016 during ASCT collection patient developed fever to 103.17F and required hospitalization   Benadryl [Diphenhydramine] Palpitations   Morphine Itching and Rash   Ondansetron Diarrhea   Tylenol [Acetaminophen] Itching and Rash    Past Surgical History:  Procedure Laterality Date    ABDOMINAL HYSTERECTOMY     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   CARDIAC ELECTROPHYSIOLOGY MAPPING AND ABLATION     CARPAL TUNNEL RELEASE Bilateral    CHOLECYSTECTOMY  2008   COLONOSCOPY WITH PROPOFOL N/A 05/07/2017   Procedure: COLONOSCOPY WITH PROPOFOL;  Surgeon: Wyline Mood, MD;  Location[facility] ENDOSCOPY;  Service: Gastroenterology;  Laterality: N/A;   COLONOSCOPY WITH PROPOFOL N/A 12/19/2020   Procedure: COLONOSCOPY WITH PROPOFOL;  Surgeon: Wyline Mood, MD;  Location: [facility] ENDOSCOPY;  Service: Gastroenterology;  Laterality: N/A;   ESOPHAGOGASTRODUODENOSCOPY (EGD) WITH PROPOFOL N/A 05/07/2017   Procedure: ESOPHAGOGASTRODUODENOSCOPY (EGD) WITH PROPOFOL;  Surgeon: Wyline Mood, MD;  Location: [facility] ENDOSCOPY;  Service: Gastroenterology;  Laterality: N/A;   FOOT SURGERY Bilateral    INCONTINENCE SURGERY  2009   INTERSTIM IMPLANT PLACEMENT     other     over active bladder   OTHER SURGICAL HISTORY     bladder stimulator    PARTIAL HYSTERECTOMY  03/1996   fibroids   PORTA CATH INSERTION N/A 03/10/2019   Procedure: PORTA CATH INSERTION;  Surgeon: Annice Needy, MD;  Location: [facility] INVASIVE CV LAB;  Service: Cardiovascular;  Laterality: N/A;   TONSILLECTOMY  2007    Social History   Tobacco Use   Smoking status: Former    Current packs/day: 0.00    Average packs/day: 1 pack/day for 20.0 years (20.0 ttl pk-yrs)    Types: Cigarettes    Start date: 07/02/1971    Quit date: 07/02/1991    Years since quitting: 32.1   Smokeless tobacco: Never  Vaping Use   Vaping status: Never Used  Substance Use Topics   Alcohol use: Yes    Alcohol/week: 2.0 standard drinks of alcohol    Types: 2 Cans of beer per week   Drug use: No     Medication list has been reviewed and updated.  No outpatient medications have been marked as taking for the 08/27/23 encounter (Orders Only) with [name], MD.       06/02/2023    3:38 PM 12/09/2022    9:50 AM 10/17/2022    2:13 PM 12/07/2021    9:32 AM  GAD 7 :  Generalized Anxiety Score  Nervous, Anxious, on Edge 3 1 1 1   Control/stop worrying 3 2 0 0  Worry too much - different things 3 2 0 0  Trouble relaxing 3 2 0 1  Restless 3 1 0 1  Easily annoyed or irritable 1 1 1  0  Afraid - awful might happen 0 0 0 0  Total GAD 7 Score 16 9 2 3   Anxiety Difficulty Somewhat difficult Somewhat difficult Not difficult at all Not difficult at all       06/02/2023    3:38 PM 03/11/2023    8:25 AM 12/09/2022    9:50 AM  Depression screen PHQ 2/9  Decreased Interest 1 1 1   Down, Depressed, Hopeless 1 1 0  PHQ - 2 Score 2 2 1   Altered sleeping 3 0 3  Tired, decreased energy 3 0 2  Change in appetite 0 0 1  Feeling bad or failure about yourself  0 0 0  Trouble concentrating 0 0 1  Moving slowly or fidgety/restless 0 0 0  Suicidal thoughts 0 0 0  PHQ-9 Score 8 2 8   Difficult doing work/chores Somewhat difficult Not difficult at all Not difficult at all    BP Readings from Last 3 Encounters:  08/25/23 126/64  08/14/23 (!) 106/55  08/07/23 (!) 131/98    Physical Exam  Wt Readings from Last 3 Encounters:  07/25/23 163 lb (73.9 kg)  07/17/23 163 lb (73.9 kg)  06/09/23 158 lb (71.7 kg)    There were no vitals taken for this visit.  Assessment and Plan:  Problem List Items Addressed This Visit       Unprioritized   Type II diabetes mellitus with complication (HCC) (Chronic)   Relevant Medications   glipiZIDE (GLUCOTROL XL) 2.5 MG 24 hr tablet    No follow-ups on file.    [name], MD [facility] [place]

## 2023-08-28 ENCOUNTER — Encounter: Payer: Self-pay | Admitting: Oncology

## 2023-08-28 ENCOUNTER — Inpatient Hospital Stay (HOSPITAL_BASED_OUTPATIENT_CLINIC_OR_DEPARTMENT_OTHER): Payer: Medicare PPO | Admitting: Oncology

## 2023-08-28 ENCOUNTER — Inpatient Hospital Stay: Payer: Medicare PPO

## 2023-08-28 VITALS — BP 159/76 | HR 58 | Temp 96.1°F | Resp 18 | Wt 165.2 lb

## 2023-08-28 DIAGNOSIS — I5032 Chronic diastolic (congestive) heart failure: Secondary | ICD-10-CM | POA: Diagnosis not present

## 2023-08-28 DIAGNOSIS — D500 Iron deficiency anemia secondary to blood loss (chronic): Secondary | ICD-10-CM

## 2023-08-28 DIAGNOSIS — I130 Hypertensive heart and chronic kidney disease with heart failure and stage 1 through stage 4 chronic kidney disease, or unspecified chronic kidney disease: Secondary | ICD-10-CM | POA: Diagnosis not present

## 2023-08-28 DIAGNOSIS — N1832 Chronic kidney disease, stage 3b: Secondary | ICD-10-CM | POA: Diagnosis not present

## 2023-08-28 DIAGNOSIS — T451X5A Adverse effect of antineoplastic and immunosuppressive drugs, initial encounter: Secondary | ICD-10-CM | POA: Diagnosis not present

## 2023-08-28 DIAGNOSIS — E538 Deficiency of other specified B group vitamins: Secondary | ICD-10-CM | POA: Diagnosis not present

## 2023-08-28 DIAGNOSIS — C9001 Multiple myeloma in remission: Secondary | ICD-10-CM

## 2023-08-28 DIAGNOSIS — G620 Drug-induced polyneuropathy: Secondary | ICD-10-CM | POA: Diagnosis not present

## 2023-08-28 DIAGNOSIS — D509 Iron deficiency anemia, unspecified: Secondary | ICD-10-CM | POA: Diagnosis not present

## 2023-08-28 DIAGNOSIS — E1122 Type 2 diabetes mellitus with diabetic chronic kidney disease: Secondary | ICD-10-CM | POA: Diagnosis not present

## 2023-08-28 LAB — CMP (CANCER CENTER ONLY)
ALT: 46 U/L — ABNORMAL HIGH (ref 0–44)
AST: 34 U/L (ref 15–41)
Albumin: 4.5 g/dL (ref 3.5–5.0)
Alkaline Phosphatase: 105 U/L (ref 38–126)
Anion gap: 9 (ref 5–15)
BUN: 28 mg/dL — ABNORMAL HIGH (ref 8–23)
CO2: 24 mmol/L (ref 22–32)
Calcium: 9.4 mg/dL (ref 8.9–10.3)
Chloride: 104 mmol/L (ref 98–111)
Creatinine: 1.38 mg/dL — ABNORMAL HIGH (ref 0.44–1.00)
GFR, Estimated: 42 mL/min — ABNORMAL LOW (ref >60)
Glucose, Bld: 168 mg/dL — ABNORMAL HIGH (ref 70–99)
Potassium: 5 mmol/L (ref 3.5–5.1)
Sodium: 137 mmol/L (ref 135–145)
Total Bilirubin: 0.4 mg/dL (ref 0.0–1.2)
Total Protein: 7.6 g/dL (ref 6.5–8.1)

## 2023-08-28 LAB — CBC WITH DIFFERENTIAL (CANCER CENTER ONLY)
Abs Immature Granulocytes: 0.03 10*3/uL (ref 0.00–0.07)
Basophils Absolute: 0 10*3/uL (ref 0.0–0.1)
Basophils Relative: 1 %
Eosinophils Absolute: 0.1 10*3/uL (ref 0.0–0.5)
Eosinophils Relative: 2 %
HCT: 36.8 % (ref 36.0–46.0)
Hemoglobin: 12.3 g/dL (ref 12.0–15.0)
Immature Granulocytes: 1 %
Lymphocytes Relative: 26 %
Lymphs Abs: 1.6 10*3/uL (ref 0.7–4.0)
MCH: 28.5 pg (ref 26.0–34.0)
MCHC: 33.4 g/dL (ref 30.0–36.0)
MCV: 85.4 fL (ref 80.0–100.0)
Monocytes Absolute: 0.5 10*3/uL (ref 0.1–1.0)
Monocytes Relative: 9 %
Neutro Abs: 3.9 10*3/uL (ref 1.7–7.7)
Neutrophils Relative %: 61 %
Platelet Count: 110 10*3/uL — ABNORMAL LOW (ref 150–400)
RBC: 4.31 MIL/uL (ref 3.87–5.11)
RDW: 15.4 % (ref 11.5–15.5)
WBC Count: 6.3 10*3/uL (ref 4.0–10.5)
nRBC: 0 % (ref 0.0–0.2)

## 2023-08-28 LAB — MAGNESIUM: Magnesium: 1.7 mg/dL (ref 1.7–2.4)

## 2023-08-28 LAB — VITAMIN B12: Vitamin B-12: 472 pg/mL (ref 180–914)

## 2023-08-28 MED ORDER — SODIUM CHLORIDE 0.9% FLUSH
10.0000 mL | Freq: Once | INTRAVENOUS | Status: AC | PRN
Start: 2023-08-28 — End: 2023-08-28
  Administered 2023-08-28: 10 mL
  Filled 2023-08-28: qty 10

## 2023-08-28 MED ORDER — SODIUM CHLORIDE 0.9 % IV SOLN
25.0000 mg | Freq: Once | INTRAVENOUS | Status: DC
  Filled 2023-08-28: qty 1

## 2023-08-28 MED ORDER — CYANOCOBALAMIN 1000 MCG/ML IJ SOLN
1000.0000 ug | Freq: Once | INTRAMUSCULAR | Status: AC
  Administered 2023-08-28: 1000 ug via INTRAMUSCULAR
  Filled 2023-08-28: qty 1

## 2023-08-28 MED ORDER — HEPARIN SOD (PORK) LOCK FLUSH 100 UNIT/ML IV SOLN
500.0000 [IU] | Freq: Once | INTRAVENOUS | Status: AC | PRN
Start: 2023-08-28 — End: 2023-08-28
  Administered 2023-08-28: 500 [IU]
  Filled 2023-08-28: qty 5

## 2023-08-28 NOTE — Progress Notes (Signed)
 No magnesium today per Dr Cathie Hoops, patient mag level is 1.7.

## 2023-08-28 NOTE — Assessment & Plan Note (Signed)
#  Chronic hypomagnesemia proceed with  IV magnesium today.  Continue weekly magnesium +/- IV magnesium.

## 2023-08-28 NOTE — Assessment & Plan Note (Signed)
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  Light chain ratio is normal. 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 Repeat immunofixation negative. . Labs reviewed and discussed with patient, stable M protein and stable light chain ratio Off Daratumumab due to frequent infections  Continue acyclovir prophylaxis.

## 2023-08-28 NOTE — Assessment & Plan Note (Addendum)
B12 level is pending continue B12 injections Q 6 weeks.

## 2023-08-28 NOTE — Assessment & Plan Note (Signed)
 Lab Results  Component Value Date   HGB 12.3 08/28/2023   TIBC 335 06/05/2023   IRONPCTSAT 7 (L) 06/05/2023   FERRITIN 73 06/05/2023    Hb has improved.

## 2023-08-28 NOTE — Progress Notes (Signed)
 Hematology/Oncology Progress note [phone] [phone]     Chief Complaint: Sarah [name] is a 68 y.o. 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  Light chain ratio is normal. 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 Repeat immunofixation negative. . Labs reviewed and discussed with patient, stable M protein and stable light chain ratio Off Daratumumab due to frequent infections  Continue acyclovir prophylaxis.   B12 deficiency B12 level is pending continue B12 injections Q 6 weeks.   Hypomagnesemia #Chronic hypomagnesemia proceed with  IV magnesium today.  Continue weekly magnesium +/- IV magnesium.     Iron deficiency anemia due to chronic blood loss Lab Results  Component Value Date   HGB 12.3 08/28/2023   TIBC 335 06/05/2023   IRONPCTSAT 7 (L) 06/05/2023   FERRITIN 73 06/05/2023    Hb has improved.      Orders Placed This Encounter  Procedures   CBC with Differential (Cancer Center Only)    Standing Status:   Future    Expected Date:   10/09/2023    Expiration Date:   08/27/2024   CMP (Cancer Center only)    Standing Status:   Future    Expected Date:   10/09/2023    Expiration Date:   08/27/2024   Magnesium    Standing Status:   Future    Expected Date:   10/09/2023    Expiration Date:   08/27/2024   Multiple Myeloma Panel (SPEP&IFE w/QIG)    Standing Status:   Future    Expected Date:   10/09/2023    Expiration Date:   08/27/2024   Kappa/lambda light chains    Standing Status:   Future    Expected Date:   10/09/2023     Expiration Date:   08/27/2024    Follow up  Lab Mag weekly x5  + IV mag.  6 weeks flex lab MD IV mag,  same labs  All questions were answered. The patient knows to call the clinic with any problems, questions or concerns.  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   Initial bone marrow revealed 80% plasma cells.  Lambda free light chains were 1340.  She had nephrotic range proteinuria.  She initially underwent induction with RVD.  Revlimid maintenance was discontinued on 01/21/2017 secondary to intolerance.     07/19/2019 Bone marrow aspirate and biopsy on  revealed a normocellular marrow with but increased lambda-restricted plasma cells (9% aspirate, 40% CD138 immunohistochemistry).  Findings were consistent with recurrent plasma cell myeloma.  Flow cytometry revealed no monoclonal B-cell or phenotypically aberrant T-cell population. Cytogenetics were 12, XX (  normal).  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    Lambda light chains have been followed: 22.2 (ratio 0.56) on 07/03/2017, 30.8 (ratio 0.78) on 09/02/2017, 36.9 (ratio 0.40) on 10/21/2017, 37.4 (ratio 0.41) on 12/16/2017, 70.7(ratio 0.31)  on 02/17/2018, 64.2 (ratio 0.27) on 04/07/2018, 78.9 (ratio 0.18) on 05/26/2018, 128.8 (ratio 0.17) on 08/06/2018, 181.5 (ratio 0.13) on 10/08/2018, 130.9 (ratio 0.13) on 10/20/2018, 160.7 (ratio 0.10) on 12/09/2018, 236.6 (ratio 0.07) on 02/01/2019,  363.6 (ratio 0.04) on 03/22/2019, 404.8 (ratio 0.04) on 04/05/2019, 420.7 (ratio 0.03) on 05/24/2019, 573.4 (ratio 0.03) on 06/23/2019, 451.05 (ratio 0.02) on 08/20/2019, 47.2 (ratio 0.13) on 10/25/2019, 22.4 (ratio 0.21) on 11/25/2019, 16.5 (ratio 0.33) on 01/10/2020, 14.6 (ratio 0.34) on 02/07/2020, 13.1 (ratio 0.31) on 03/07/2020, 10.1 (ratio 0.38) on 04/10/2020, and 9.5 (ratio 0.21) on 06/19/2020.   24 hour UPEP on 06/03/2019 revealed kappa free light chains 95.76, lambda free light chains 1,260.71, and ratio 0.08.  24 hour UPEP on 08/23/2019 revealed total protein of 782 mg/24 hrs with lambda free light chains 1,084.16 mg/L and ratio of 0.10 (1.03-31.76).  M spike in urine was 46.1% (361 mg/24 hrs).    Bone survey on 04/08/2016 and 05/28/2017 revealed no definite lytic lesion seen in the visualized skeleton.  Bone survey on 11/19/2018 revealed no suspicious lucent lesions and no acute bony abnormality.  PET scan on 07/12/2019 revealed no focal metabolic activity to suggest active myeloma within the skeleton. There were no lytic lesions identified on the CT portion of the exam or soft tissue plasmacytomas. There was no evidence of multiple myeloma.    Pretreatment RBC phenotype on 09/23/2019 was positive for C, e, DUFFY B, KIDD B, M, S, and s antigen; negative for c, E, KELL, DUFFY A, KIDD A, and N antigen.    09/27/2019 - 10/25/2019; 12/09/2019 - 03/13/2020 6 cycles of daratumumab and hyaluronidase-fihj, Pomalyst, and Decadron (DPd) .  Cycle #1 was complicated by fever and neutropenia requiring admission.  Cycle #2 was complicated by pneumonia requiring admission.  Cycle #6 was complicated with an ER evaluation for an elevated lactic acid.   05/10/2020 second autologous stem cell transplant at [facility]   She underwent conditioning with melphalan 140 mg/m2.    She is s/p week #3 Velcade maintenance (began 08/31/2020 - 09/28/2020).  She has no increase in neuropathy.  She has severe aortic stenosis.   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 Cardiology is following for a possible TAVR in the future.  Echo on 07/05/2020 revealed moderate to severe aortic stenosis with an EF of 50-55%.  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 Zometa was discontinued in 01/2017.  She has chronic nausea on Phenergan.     B12 deficiency.  B12 was 254 on 04/09/2017, 295 on 08/20/2018, and 391 on 10/08/2018.  She was on oral B12.  She received B12 monthly (last 09/14/2020).  Folate was 12.1 on 01/10/2020.    She has iron deficiency.  Ferritin was 32 on 07/01/2019.  She received Venofer on 07/15/2019 and 07/22/2019.   She has hypogammaglobulinemia.  IgG was 245 on 11/25/2019.  She received monthly IVIG (07/22/021 - 03/22/2020).  She received IVIG 400 mg/kg on 01/20/2020, 200 mg/kg on 02/17/2020, and 300 mg/kg on 03/22/2020.  IVIG on 01/20/2020 was complicated by acute renal failure. IgG trough level was 418 on 02/17/2020.  10/15/2019 - 10/20/2019 She was admitted to [facility]  with fever and neutropenia.  Cultures were negative.  CXR was negative. She received broad spectrum antibiotics and daily Granix.  She received IVF for acute renal insufficiency due to diarrhea and dehydration.  Creatine was 1.66 on admission and 1.12 on discharge.    03/01/2020 - 03/05/2020 admitted to [facility] from with fever and neutropenia. CXR revealed no active cardiopulmonary disease. Chest CT with contrast revealed no acute intrathoracic pathology. There were findings which could be suggestive of prior granulomatous disease. She was treated with Cefepime and Vancomycin, then switched to ciprofloxacin on 03/03/2020.   05/08/2020 - 12/05/2021The patient was admitted to [facility] from  for autologous bone marrow transplant.   She received conditioning with melphalan 140 mg/m2.  Bone marrow transplant was on 05/10/2020. The patient developed fevers daily from  05/19/2020 - 05/24/2020. Blood cultures were + for strept sanguis bacteremia.  She was treated cefepime, vancomycin, and solumedrol for possible engraftment syndrome. Cefepime was switched to ceftriaxone; she completed 2 weeks of ceftriaxone on 06/03/2020. She had chemotherapy induced diarrhea.  She experienced urinary retention.  Feb 2022 patient has been on maintenance Velcade 2 mg every 2 weeks.  Chronic diarrhea seen by gastroenterology Dr. Tobi Bastos and was recommended to avoid artificial sugars in her diet including Diet Coke and coffee mate.  She feels some improvement of her diarrhea frequency since the dietary modification.  GI profile PCR negative, C. difficile toxin A+ B negative, fecal calprotectin 77 12/19/2020 colonoscopy showed 2 subcentimeter polyps in the ascending colon, resected and removed.  Pathology showed tubular adenoma.  No malignancy.  Normal mucosa in the entire examined colon.  Biopsied, pathology showed colonic mucosa with no significant pathological alteration.  Negative for active inflammation and features of chronicity.  Negative for microscopic colitis, dysplasia, and malignanc  Diabetes, patient has discontinued metformin due to diarrhea and started on glipizide 2.5 mg   03/05/2021-03/09/2021 Patient was hospitalized due to fever and chills, nonproductive cough, shortness of breath. X-ray showed right lower lobe atelectasis versus pneumonia.  Blood cultures negative.  Urine culture showed 60,000 colonies of Enterococcus facialis.  Patient received antibiotics. Patient also had abdominal pelvis CT scan for work-up of intermittent lower abdomen pain.No acute abnormality.   05/14/21 last dose of Velcade maintenance.  establish care with neurology Dr. Theora Master and her neuropathy regimen has been switched from gabapentin to Lyrica. 05/29/2021 patient got influenza   neuropathy medication has been switched from gabapentin/Lyrica to nortriptyline.  08/18/2021 Hospitalized  due to pneumonia from metapnuemovirus, hospitalization was complicated with NSTEMI. She received heparin gtt.  Treated with antibiotics for coverage of pneumonia,  diuretics for pulmonary edema. She received IVIG 400mg /kg x 1 for hypoglobulinemia. Discharged on 08/25/21 with Augmentin and Zithromax.   May 2023, Daratumumab being held for [facility] infectious disease physician Dr. Kennedy Bucker due to frequent infection.  Diagnosed with CHF [HFrEF] -NYHA class II-III, she follows up with cardiology, started on entresto  hospitalization due to CHF exacerbation, community acquired pneumonia.   Presented to emergency room on 07/01/2022 Respiratory panel RT-PCR negative for COVID-19, influenza and RSV.   INTERVAL HISTORY Gali Spinney is a 68 y.o. female who has above history reviewed by me today presents for follow up visit for management of multiple myeloma.  Patient has been on weekly magnesium infusion as needed hypomagnesemia + CHF [nonischemic cardiomyopathy], aortic stenosis, follows up with cardiology. Today her breathing is good.  No new complaints.   Past Medical History:  Diagnosis Date   Anemia    Anxiety  Aortic stenosis    a. 05/2020 Echo: EF 45-50%, sev AS - seen by TAVR team @ [facility] - CTA sugg of tricuspid valve w/ fusing of 2 leaflets-TAVR deferred in setting of acute infxn; b. 07/2020 Echo: EF 50-55%, mod-sev AS; c. 12/2020 Echo: EF>55%. Mod-sev paradoxical low-flow low-gradient AS; d. 08/2021 Echo: EF 35-40%, severe AS, triv AI, mild MR.   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 Variable EF over time; b. 08/2017 Echo: EF 40%; b. 03/2020 Echo: EF 55-60%; c. 05/2020 Echo: EF 45-50%; d. 07/2020 Echo: EF 50-55%; e. 12/2020 Echo: EF>55%; f. 08/2021 Echo: EF 35-40%.   Chronic heart failure with preserved ejection fraction (HFpEF) (HCC)    a. Variable EF over time; b. 08/2017  Echo: EF 40%; b. 03/2020 Echo: EF 55-60%; c. 05/2020 Echo: EF 45-50%; d. 07/2020 Echo: EF 50-55%; e. 12/2020 Echo: EF>55%; f. 08/2021 Echo: EF 35-40%, no RV, mildly dil LA, mild MR, triv AI, severe AS.   Chronic pain syndrome 07/08/2016   CKD (chronic kidney disease) stage 3, GFR 30-59 ml/min (HCC)    Depression    Diabetes mellitus (HCC)    Dizziness    Fatty liver    Frequent falls    GERD (gastroesophageal reflux disease)    Gout    Heart murmur    History of blood transfusion    History of bone marrow transplant (HCC)    History of uterine fibroid    Hx of cardiac catheterization    a. 01/2016 Cath [facility] - after abnl nuc): Nl cors.   Hypertension    Hypomagnesemia    IDA (iron deficiency anemia)    Multiple myeloma (HCC)    NSTEMI (non-ST elevated myocardial infarction) (HCC) 08/21/2021   Personal history of chemotherapy    Pleurisy 04/09/2022   PSVT (paroxysmal supraventricular tachycardia) (HCC)    a. S/p RFCA [facility]).   PVC's (premature ventricular contractions)    a. Well-managed w/ bisoprolol in outpt setting.   Renal cyst     Past Surgical History:  Procedure Laterality Date   ABDOMINAL HYSTERECTOMY     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   CARDIAC ELECTROPHYSIOLOGY MAPPING AND ABLATION     CARPAL TUNNEL RELEASE Bilateral    CHOLECYSTECTOMY  2008   COLONOSCOPY WITH PROPOFOL N/A 05/07/2017   Procedure: COLONOSCOPY WITH PROPOFOL;  Surgeon: Wyline Mood, MD;  Location: [facility] ENDOSCOPY;  Service: Gastroenterology;  Laterality: N/A;   COLONOSCOPY WITH PROPOFOL N/A 12/19/2020   Procedure: COLONOSCOPY WITH PROPOFOL;  Surgeon: Wyline Mood, MD;  Location: [facility] ENDOSCOPY;  Service: Gastroenterology;  Laterality: N/A;   ESOPHAGOGASTRODUODENOSCOPY (EGD) WITH PROPOFOL N/A 05/07/2017   Procedure: ESOPHAGOGASTRODUODENOSCOPY (EGD) WITH PROPOFOL;  Surgeon: Wyline Mood, MD;  Location: [facility] ENDOSCOPY;  Service: Gastroenterology;  Laterality: N/A;   FOOT SURGERY Bilateral     INCONTINENCE SURGERY  2009   INTERSTIM IMPLANT PLACEMENT     other     over active bladder   OTHER SURGICAL HISTORY     bladder stimulator    PARTIAL HYSTERECTOMY  03/1996   fibroids   PORTA CATH INSERTION N/A 03/10/2019   Procedure: PORTA CATH INSERTION;  Surgeon: Annice Needy, MD;  Location: [facility] INVASIVE CV LAB;  Service: Cardiovascular;  Laterality: N/A;   TONSILLECTOMY  2007    Family History  Problem Relation Age of Onset   Colon cancer  Father    Renal Disease Father    Diabetes Mellitus II Father    Melanoma Paternal Grandmother    Breast cancer Maternal Aunt 61   Anemia Mother    Heart disease Mother    Heart failure Mother    Renal Disease Mother    Congestive Heart Failure Mother    Heart disease Maternal Uncle    Throat cancer Maternal Uncle    Lung cancer Maternal Uncle    Liver disease Maternal Uncle    Heart failure Maternal Uncle    Hearing loss Son 32       Suicide     Social History:  reports that she quit smoking about 32 years ago. Her smoking use included cigarettes. She started smoking about 52 years ago. She has a 20 pack-year smoking history. She has never used smokeless tobacco. She reports current alcohol use of about 2.0 standard drinks of alcohol per week. She reports that she does not use drugs.  She is on disability. She notes exposure to perchloroethylene [facility]).   Allergies:  Allergies  Allergen Reactions   Oxycodone-Acetaminophen Anaphylaxis    Swelling and rash   Celebrex [Celecoxib] Diarrhea   Codeine    Plerixafor     In 2016 during ASCT collection patient developed fever to 103.35F and required hospitalization   Benadryl [Diphenhydramine] Palpitations   Morphine Itching and Rash   Ondansetron Diarrhea   Tylenol [Acetaminophen] Itching and Rash    Current Medications: Current Outpatient Medications  Medication Sig Dispense Refill   allopurinol (ZYLOPRIM) 100 MG tablet TAKE 1 TABLET(100 MG) BY MOUTH DAILY as needed 90 tablet 1    amiodarone (PACERONE) 200 MG tablet Take 200 mg by mouth daily.     atorvastatin (LIPITOR) 10 MG tablet Take 0.5 tablets (5 mg total) by mouth daily. 45 tablet 1   bisoprolol (ZEBETA) 10 MG tablet Take 1 tablet (10 mg total) by mouth daily. 90 tablet 1   calcium carbonate (OS-CAL) 1250 (500 Ca) MG chewable tablet Chew 2 tablets by mouth daily.     cholestyramine (QUESTRAN) 4 GM/DOSE powder Take 1 packet (4 g total) by mouth 2 (two) times daily with a meal. 240 packet 2   diazepam (VALIUM) 5 MG tablet Take 0.5-1 tablets (2.5-5 mg total) by mouth at bedtime as needed (insomnia). 30 tablet 3   diclofenac sodium (VOLTAREN) 1 % GEL Apply 2 g topically 4 (four) times daily. 100 g 1   diphenoxylate-atropine (LOMOTIL) 2.5-0.025 MG tablet Take 2 tablets by mouth 4 (four) times daily as needed for diarrhea or loose stools. 180 tablet 3   ENTRESTO 24-26 MG Take 1 tablet by mouth 2 (two) times daily.     FLUoxetine (PROZAC) 40 MG capsule Take 1 capsule (40 mg total) by mouth daily. 90 capsule 0   furosemide (LASIX) 40 MG tablet Take 0.5 tablets (20 mg total) by mouth daily. Home dose.     glipiZIDE (GLUCOTROL XL) 2.5 MG 24 hr tablet Take 1 tablet (2.5 mg total) by mouth daily with breakfast. 90 tablet 1   glucose blood test strip      JARDIANCE 10 MG TABS tablet Take 10 mg by mouth daily.     lidocaine (XYLOCAINE) 2 % solution Use as directed 15 mLs in the mouth or throat every 3 (three) hours as needed for mouth pain (swish and spit). 100 mL 0   magnesium chloride (SLOW-MAG) 64 MG TBEC SR tablet Take 1 tablet (64 mg total) by mouth  daily. 60 tablet 1   Multiple Vitamin (MULTIVITAMIN) tablet Take 1 tablet by mouth daily.     omeprazole (PRILOSEC) 40 MG capsule TAKE 1 CAPSULE IN THE MORNING AND TAKE 1 CAPSULE AT BEDTIME 180 capsule 3   pentoxifylline (TRENTAL) 400 MG CR tablet Take 400 mg by mouth 3 (three) times daily with meals.     potassium chloride SA (KLOR-CON M) 20 MEQ tablet Take 1 tablet (20 mEq  total) by mouth daily. 7 tablet 0   promethazine (PHENERGAN) 25 MG tablet Take 1 tablet (25 mg total) by mouth every 6 (six) hours as needed for nausea or vomiting. 90 tablet 1   spironolactone (ALDACTONE) 25 MG tablet Take by mouth.     tiZANidine (ZANAFLEX) 4 MG tablet Take 1 tablet (4 mg total) by mouth every 6 (six) hours as needed for muscle spasms. 90 tablet 1   traMADol (ULTRAM) 50 MG tablet      traZODone (DESYREL) 100 MG tablet TAKE 1 TABLET AT BEDTIME 90 tablet 3   vitamin E 45 MG (100 UNITS) capsule Take by mouth daily.     losartan (COZAAR) 25 MG tablet Take 1 tablet by mouth daily. (Patient not taking: Reported on 08/28/2023)     No current facility-administered medications for this visit.   Facility-Administered Medications Ordered in Other Visits  Medication Dose Route Frequency Provider Last Rate Last Admin   promethazine (PHENERGAN) 25 mg in sodium chloride 0.9 % 50 mL IVPB  25 mg Intravenous Once Rickard Patience, MD       sodium chloride flush (NS) 0.9 % injection 10 mL  10 mL Intravenous PRN Rickard Patience, MD   10 mL at 04/02/21 0904   sodium chloride flush (NS) 0.9 % injection 10 mL  10 mL Intracatheter Once PRN Rickard Patience, MD        Review of Systems  Constitutional:  Negative for chills, fever, malaise/fatigue and weight loss.  HENT:  Negative for congestion and sore throat.   Eyes:  Negative for redness.  Respiratory:  Negative for cough, shortness of breath and wheezing.   Cardiovascular:  Negative for chest pain, palpitations and leg swelling.  Gastrointestinal:  Positive for diarrhea. Negative for abdominal pain, blood in stool, nausea and vomiting.  Genitourinary:  Negative for dysuria.  Musculoskeletal:  Positive for back pain and joint pain. Negative for myalgias.  Skin:  Negative for rash.  Neurological:  Positive for tingling and sensory change. Negative for dizziness and tremors.  Endo/Heme/Allergies:  Does not bruise/bleed easily.  Psychiatric/Behavioral:  Negative for  hallucinations.     Performance status (ECOG): 1  Vitals Blood pressure (!) 159/76, pulse (!) 58, temperature (!) 96.1 F (35.6 C), resp. rate 18, weight 165 lb 3.2 oz (74.9 kg), SpO2 98%.  Physical Exam Constitutional:      General: She is not in acute distress.    Appearance: She is not diaphoretic.  HENT:     Head: Normocephalic and atraumatic.  Eyes:     General: No scleral icterus. Cardiovascular:     Rate and Rhythm: Normal rate.     Heart sounds: Murmur heard.  Pulmonary:     Effort: Pulmonary effort is normal. No respiratory distress.     Breath sounds: Normal breath sounds. No wheezing.  Abdominal:     General: There is no distension.  Musculoskeletal:        General: Normal range of motion.     Cervical back: Normal range of motion and neck supple.  Skin:    General: Skin is warm and dry.     Findings: No erythema.     Comments: Lower extremity cat bite cellulitis has improved.   Neurological:     Mental Status: She is alert and oriented to person, place, and time. Mental status is at baseline.     Motor: No abnormal muscle tone.  Psychiatric:        Mood and Affect: Mood and affect normal.      Laboratory data    Latest Ref Rng & Units 08/28/2023   10:06 AM 07/17/2023    9:58 AM 06/05/2023    9:34 AM  CBC  WBC 4.0 - 10.5 K/uL 6.3  6.3  7.5   Hemoglobin 12.0 - 15.0 g/dL 40.9  81.1  9.5   Hematocrit 36.0 - 46.0 % 36.8  34.8  28.5   Platelets 150 - 400 K/uL 110  126  127       Latest Ref Rng & Units 08/28/2023   10:06 AM 07/17/2023    9:58 AM 06/26/2023   10:30 AM  CMP  Glucose 70 - 99 mg/dL 914  782  956   BUN 8 - 23 mg/dL 28  35  42   Creatinine 0.44 - 1.00 mg/dL 2.13  0.86  5.78   Sodium 135 - 145 mmol/L 137  135  135   Potassium 3.5 - 5.1 mmol/L 5.0  4.4  3.6   Chloride 98 - 111 mmol/L 104  102  99   CO2 22 - 32 mmol/L 24  23  24    Calcium 8.9 - 10.3 mg/dL 9.4  9.2  8.7   Total Protein 6.5 - 8.1 g/dL 7.6  7.1    Total Bilirubin 0.0 - 1.2 mg/dL  0.4  0.5    Alkaline Phos 38 - 126 U/L 105  89    AST 15 - 41 U/L 34  24    ALT 0 - 44 U/L 46  27

## 2023-08-29 LAB — KAPPA/LAMBDA LIGHT CHAINS
Kappa free light chain: 17.8 mg/L (ref 3.3–19.4)
Kappa, lambda light chain ratio: 0.82 (ref 0.26–1.65)
Lambda free light chains: 21.8 mg/L (ref 5.7–26.3)

## 2023-09-01 LAB — MULTIPLE MYELOMA PANEL, SERUM
Albumin SerPl Elph-Mcnc: 4.2 g/dL (ref 2.9–4.4)
Albumin/Glob SerPl: 1.5 (ref 0.7–1.7)
Alpha 1: 0.3 g/dL (ref 0.0–0.4)
Alpha2 Glob SerPl Elph-Mcnc: 0.9 g/dL (ref 0.4–1.0)
B-Globulin SerPl Elph-Mcnc: 1.1 g/dL (ref 0.7–1.3)
Gamma Glob SerPl Elph-Mcnc: 0.6 g/dL (ref 0.4–1.8)
Globulin, Total: 2.9 g/dL (ref 2.2–3.9)
IgA: 278 mg/dL (ref 87–352)
IgG (Immunoglobin G), Serum: 753 mg/dL (ref 586–1602)
IgM (Immunoglobulin M), Srm: 33 mg/dL (ref 26–217)
Total Protein ELP: 7.1 g/dL (ref 6.0–8.5)

## 2023-09-04 ENCOUNTER — Inpatient Hospital Stay: Payer: Medicare PPO

## 2023-09-04 ENCOUNTER — Inpatient Hospital Stay: Payer: Medicare PPO | Attending: Oncology

## 2023-09-04 DIAGNOSIS — Z79899 Other long term (current) drug therapy: Secondary | ICD-10-CM | POA: Diagnosis not present

## 2023-09-04 DIAGNOSIS — E538 Deficiency of other specified B group vitamins: Secondary | ICD-10-CM | POA: Insufficient documentation

## 2023-09-04 DIAGNOSIS — C9001 Multiple myeloma in remission: Secondary | ICD-10-CM | POA: Insufficient documentation

## 2023-09-04 DIAGNOSIS — Z95828 Presence of other vascular implants and grafts: Secondary | ICD-10-CM

## 2023-09-04 LAB — MAGNESIUM: Magnesium: 1.7 mg/dL (ref 1.7–2.4)

## 2023-09-04 MED ORDER — HEPARIN SOD (PORK) LOCK FLUSH 100 UNIT/ML IV SOLN
500.0000 [IU] | Freq: Once | INTRAVENOUS | Status: AC
  Administered 2023-09-04: 500 [IU] via INTRAVENOUS
  Filled 2023-09-04: qty 5

## 2023-09-04 NOTE — Addendum Note (Signed)
 Addended by: Warren Lacy on: 09/04/2023 10:21 AM   Modules accepted: Orders

## 2023-09-11 ENCOUNTER — Inpatient Hospital Stay: Payer: Medicare PPO

## 2023-09-11 VITALS — BP 125/59 | HR 61 | Temp 96.6°F | Resp 18

## 2023-09-11 DIAGNOSIS — C9001 Multiple myeloma in remission: Secondary | ICD-10-CM

## 2023-09-11 LAB — MAGNESIUM: Magnesium: 1.5 mg/dL — ABNORMAL LOW (ref 1.7–2.4)

## 2023-09-11 MED ORDER — SODIUM CHLORIDE 0.9% FLUSH
10.0000 mL | Freq: Once | INTRAVENOUS | Status: AC | PRN
  Administered 2023-09-11: 10 mL
  Filled 2023-09-11: qty 10

## 2023-09-11 MED ORDER — MAGNESIUM SULFATE 4 GM/100ML IV SOLN
4.0000 g | Freq: Once | INTRAVENOUS | Status: AC
  Administered 2023-09-11: 4 g via INTRAVENOUS
  Filled 2023-09-11: qty 100

## 2023-09-11 MED ORDER — SODIUM CHLORIDE 0.9 % IV SOLN
25.0000 mg | Freq: Once | INTRAVENOUS | Status: AC
  Administered 2023-09-11: 25 mg via INTRAVENOUS
  Filled 2023-09-11: qty 1

## 2023-09-11 MED ORDER — HEPARIN SOD (PORK) LOCK FLUSH 100 UNIT/ML IV SOLN
500.0000 [IU] | Freq: Once | INTRAVENOUS | Status: AC | PRN
  Administered 2023-09-11: 500 [IU]
  Filled 2023-09-11: qty 5

## 2023-09-11 MED ORDER — SODIUM CHLORIDE 0.9 % IV SOLN
INTRAVENOUS | Status: DC
  Filled 2023-09-11: qty 250

## 2023-09-18 ENCOUNTER — Inpatient Hospital Stay: Payer: Medicare PPO

## 2023-09-18 VITALS — BP 127/52 | HR 64 | Temp 97.5°F | Resp 16

## 2023-09-18 DIAGNOSIS — C9001 Multiple myeloma in remission: Secondary | ICD-10-CM

## 2023-09-18 DIAGNOSIS — Z95828 Presence of other vascular implants and grafts: Secondary | ICD-10-CM

## 2023-09-18 LAB — MAGNESIUM: Magnesium: 1.6 mg/dL — ABNORMAL LOW (ref 1.7–2.4)

## 2023-09-18 MED ORDER — SODIUM CHLORIDE 0.9% FLUSH
10.0000 mL | INTRAVENOUS | Status: DC | PRN
Start: 2023-09-18 — End: 2023-09-18
  Administered 2023-09-18: 10 mL via INTRAVENOUS
  Filled 2023-09-18: qty 10

## 2023-09-18 MED ORDER — SODIUM CHLORIDE 0.9 % IV SOLN
INTRAVENOUS | Status: DC
  Filled 2023-09-18: qty 250

## 2023-09-18 MED ORDER — HEPARIN SOD (PORK) LOCK FLUSH 100 UNIT/ML IV SOLN
500.0000 [IU] | Freq: Once | INTRAVENOUS | Status: AC
  Administered 2023-09-18: 500 [IU] via INTRAVENOUS
  Filled 2023-09-18: qty 5

## 2023-09-18 MED ORDER — HEPARIN SOD (PORK) LOCK FLUSH 100 UNIT/ML IV SOLN
500.0000 [IU] | Freq: Once | INTRAVENOUS | Status: DC
  Filled 2023-09-18: qty 5

## 2023-09-18 MED ORDER — MAGNESIUM SULFATE 2 GM/50ML IV SOLN
2.0000 g | Freq: Once | INTRAVENOUS | Status: AC
  Administered 2023-09-18: 2 g via INTRAVENOUS
  Filled 2023-09-18: qty 50

## 2023-09-18 MED ORDER — SODIUM CHLORIDE 0.9% FLUSH
10.0000 mL | INTRAVENOUS | Status: DC | PRN
  Administered 2023-09-18: 10 mL via INTRAVENOUS
  Filled 2023-09-18: qty 10

## 2023-09-24 ENCOUNTER — Encounter: Payer: Self-pay | Admitting: Oncology

## 2023-09-25 ENCOUNTER — Inpatient Hospital Stay: Payer: Medicare PPO

## 2023-09-25 VITALS — BP 125/75 | HR 62 | Temp 97.9°F | Resp 18

## 2023-09-25 DIAGNOSIS — Z95828 Presence of other vascular implants and grafts: Secondary | ICD-10-CM

## 2023-09-25 DIAGNOSIS — C9001 Multiple myeloma in remission: Secondary | ICD-10-CM

## 2023-09-25 LAB — MAGNESIUM: Magnesium: 1.4 mg/dL — ABNORMAL LOW (ref 1.7–2.4)

## 2023-09-25 MED ORDER — MAGNESIUM SULFATE 4 GM/100ML IV SOLN
4.0000 g | Freq: Once | INTRAVENOUS | Status: AC
  Administered 2023-09-25: 4 g via INTRAVENOUS
  Filled 2023-09-25: qty 100

## 2023-09-25 MED ORDER — SODIUM CHLORIDE 0.9 % IV SOLN
INTRAVENOUS | Status: DC
Start: 2023-09-25 — End: 2023-09-25
  Filled 2023-09-25: qty 250

## 2023-09-25 MED ORDER — SODIUM CHLORIDE 0.9 % IV SOLN
25.0000 mg | Freq: Once | INTRAVENOUS | Status: AC
  Administered 2023-09-25: 25 mg via INTRAVENOUS
  Filled 2023-09-25: qty 1

## 2023-09-25 MED ORDER — SODIUM CHLORIDE 0.9% FLUSH
10.0000 mL | Freq: Once | INTRAVENOUS | Status: AC
  Administered 2023-09-25: 10 mL via INTRAVENOUS
  Filled 2023-09-25: qty 10

## 2023-09-25 MED ORDER — HEPARIN SOD (PORK) LOCK FLUSH 100 UNIT/ML IV SOLN
500.0000 [IU] | Freq: Once | INTRAVENOUS | Status: AC
  Administered 2023-09-25: 500 [IU]
  Filled 2023-09-25: qty 5

## 2023-09-30 ENCOUNTER — Ambulatory Visit
Admission: RE | Admit: 2023-09-30 | Discharge: 2023-09-30 | Disposition: A | Discharge status: Home / Self Care | Payer: Medicare PPO | Source: Ambulatory Visit | Attending: Internal Medicine | Admitting: Internal Medicine

## 2023-09-30 ENCOUNTER — Encounter: Payer: Self-pay | Admitting: Internal Medicine

## 2023-09-30 DIAGNOSIS — Z780 Asymptomatic menopausal state: Secondary | ICD-10-CM | POA: Diagnosis present

## 2023-10-01 NOTE — Telephone Encounter (Signed)
 Please review.  KP

## 2023-10-02 ENCOUNTER — Inpatient Hospital Stay: Payer: Medicare PPO | Attending: Oncology

## 2023-10-02 ENCOUNTER — Inpatient Hospital Stay: Payer: Medicare PPO

## 2023-10-02 DIAGNOSIS — Z90710 Acquired absence of both cervix and uterus: Secondary | ICD-10-CM | POA: Diagnosis not present

## 2023-10-02 DIAGNOSIS — Z832 Family history of diseases of the blood and blood-forming organs and certain disorders involving the immune mechanism: Secondary | ICD-10-CM | POA: Insufficient documentation

## 2023-10-02 DIAGNOSIS — I130 Hypertensive heart and chronic kidney disease with heart failure and stage 1 through stage 4 chronic kidney disease, or unspecified chronic kidney disease: Secondary | ICD-10-CM | POA: Insufficient documentation

## 2023-10-02 DIAGNOSIS — Z9049 Acquired absence of other specified parts of digestive tract: Secondary | ICD-10-CM | POA: Insufficient documentation

## 2023-10-02 DIAGNOSIS — I252 Old myocardial infarction: Secondary | ICD-10-CM | POA: Diagnosis not present

## 2023-10-02 DIAGNOSIS — Z79899 Other long term (current) drug therapy: Secondary | ICD-10-CM | POA: Insufficient documentation

## 2023-10-02 DIAGNOSIS — C9001 Multiple myeloma in remission: Secondary | ICD-10-CM

## 2023-10-02 DIAGNOSIS — Z888 Allergy status to other drugs, medicaments and biological substances status: Secondary | ICD-10-CM | POA: Diagnosis not present

## 2023-10-02 DIAGNOSIS — I429 Cardiomyopathy, unspecified: Secondary | ICD-10-CM | POA: Diagnosis not present

## 2023-10-02 DIAGNOSIS — Z885 Allergy status to narcotic agent status: Secondary | ICD-10-CM | POA: Insufficient documentation

## 2023-10-02 DIAGNOSIS — E538 Deficiency of other specified B group vitamins: Secondary | ICD-10-CM | POA: Diagnosis not present

## 2023-10-02 DIAGNOSIS — Z886 Allergy status to analgesic agent status: Secondary | ICD-10-CM | POA: Insufficient documentation

## 2023-10-02 DIAGNOSIS — Z8379 Family history of other diseases of the digestive system: Secondary | ICD-10-CM | POA: Insufficient documentation

## 2023-10-02 DIAGNOSIS — G894 Chronic pain syndrome: Secondary | ICD-10-CM | POA: Diagnosis not present

## 2023-10-02 DIAGNOSIS — Z801 Family history of malignant neoplasm of trachea, bronchus and lung: Secondary | ICD-10-CM | POA: Insufficient documentation

## 2023-10-02 DIAGNOSIS — I5032 Chronic diastolic (congestive) heart failure: Secondary | ICD-10-CM | POA: Insufficient documentation

## 2023-10-02 DIAGNOSIS — Z8249 Family history of ischemic heart disease and other diseases of the circulatory system: Secondary | ICD-10-CM | POA: Insufficient documentation

## 2023-10-02 DIAGNOSIS — Z9484 Stem cells transplant status: Secondary | ICD-10-CM | POA: Insufficient documentation

## 2023-10-02 DIAGNOSIS — Z9221 Personal history of antineoplastic chemotherapy: Secondary | ICD-10-CM | POA: Diagnosis not present

## 2023-10-02 DIAGNOSIS — D801 Nonfamilial hypogammaglobulinemia: Secondary | ICD-10-CM | POA: Diagnosis not present

## 2023-10-02 DIAGNOSIS — Z9481 Bone marrow transplant status: Secondary | ICD-10-CM | POA: Diagnosis not present

## 2023-10-02 DIAGNOSIS — N184 Chronic kidney disease, stage 4 (severe): Secondary | ICD-10-CM | POA: Insufficient documentation

## 2023-10-02 DIAGNOSIS — D500 Iron deficiency anemia secondary to blood loss (chronic): Secondary | ICD-10-CM | POA: Diagnosis not present

## 2023-10-02 DIAGNOSIS — E1122 Type 2 diabetes mellitus with diabetic chronic kidney disease: Secondary | ICD-10-CM | POA: Insufficient documentation

## 2023-10-02 DIAGNOSIS — Z87891 Personal history of nicotine dependence: Secondary | ICD-10-CM | POA: Insufficient documentation

## 2023-10-02 DIAGNOSIS — Z833 Family history of diabetes mellitus: Secondary | ICD-10-CM | POA: Insufficient documentation

## 2023-10-02 DIAGNOSIS — Z822 Family history of deafness and hearing loss: Secondary | ICD-10-CM | POA: Insufficient documentation

## 2023-10-02 DIAGNOSIS — I350 Nonrheumatic aortic (valve) stenosis: Secondary | ICD-10-CM | POA: Insufficient documentation

## 2023-10-02 DIAGNOSIS — Z841 Family history of disorders of kidney and ureter: Secondary | ICD-10-CM | POA: Insufficient documentation

## 2023-10-02 DIAGNOSIS — Z800 Family history of malignant neoplasm of digestive organs: Secondary | ICD-10-CM | POA: Diagnosis not present

## 2023-10-02 DIAGNOSIS — Z803 Family history of malignant neoplasm of breast: Secondary | ICD-10-CM | POA: Insufficient documentation

## 2023-10-02 DIAGNOSIS — Z808 Family history of malignant neoplasm of other organs or systems: Secondary | ICD-10-CM | POA: Insufficient documentation

## 2023-10-02 LAB — MAGNESIUM: Magnesium: 1.3 mg/dL — ABNORMAL LOW (ref 1.7–2.4)

## 2023-10-02 MED ORDER — MAGNESIUM SULFATE 2 GM/50ML IV SOLN
2.0000 g | Freq: Once | INTRAVENOUS | Status: AC
  Administered 2023-10-02: 2 g via INTRAVENOUS
  Filled 2023-10-02: qty 50

## 2023-10-02 MED ORDER — SODIUM CHLORIDE 0.9 % IV SOLN
25.0000 mg | Freq: Once | INTRAVENOUS | Status: AC
  Administered 2023-10-02: 25 mg via INTRAVENOUS
  Filled 2023-10-02: qty 1

## 2023-10-02 MED ORDER — SODIUM CHLORIDE 0.9 % IV SOLN
INTRAVENOUS | Status: DC
  Filled 2023-10-02: qty 250

## 2023-10-02 MED ORDER — MAGNESIUM SULFATE 4 GM/100ML IV SOLN
4.0000 g | Freq: Once | INTRAVENOUS | Status: AC
  Administered 2023-10-02: 4 g via INTRAVENOUS
  Filled 2023-10-02: qty 100

## 2023-10-08 ENCOUNTER — Other Ambulatory Visit: Payer: Self-pay | Admitting: *Deleted

## 2023-10-08 ENCOUNTER — Inpatient Hospital Stay: Payer: Medicare PPO

## 2023-10-08 ENCOUNTER — Encounter: Payer: Self-pay | Admitting: Oncology

## 2023-10-08 ENCOUNTER — Inpatient Hospital Stay (HOSPITAL_BASED_OUTPATIENT_CLINIC_OR_DEPARTMENT_OTHER): Payer: Medicare PPO | Admitting: Oncology

## 2023-10-08 VITALS — BP 111/45 | HR 56 | Temp 97.0°F | Resp 18 | Wt 166.4 lb

## 2023-10-08 DIAGNOSIS — D500 Iron deficiency anemia secondary to blood loss (chronic): Secondary | ICD-10-CM

## 2023-10-08 DIAGNOSIS — Z95828 Presence of other vascular implants and grafts: Secondary | ICD-10-CM

## 2023-10-08 DIAGNOSIS — C9001 Multiple myeloma in remission: Secondary | ICD-10-CM | POA: Diagnosis not present

## 2023-10-08 DIAGNOSIS — E538 Deficiency of other specified B group vitamins: Secondary | ICD-10-CM | POA: Diagnosis not present

## 2023-10-08 DIAGNOSIS — N184 Chronic kidney disease, stage 4 (severe): Secondary | ICD-10-CM | POA: Insufficient documentation

## 2023-10-08 LAB — CBC WITH DIFFERENTIAL (CANCER CENTER ONLY)
Abs Immature Granulocytes: 0.04 10*3/uL (ref 0.00–0.07)
Basophils Absolute: 0 10*3/uL (ref 0.0–0.1)
Basophils Relative: 0 %
Eosinophils Absolute: 0.1 10*3/uL (ref 0.0–0.5)
Eosinophils Relative: 2 %
HCT: 38.7 % (ref 36.0–46.0)
Hemoglobin: 12.9 g/dL (ref 12.0–15.0)
Immature Granulocytes: 1 %
Lymphocytes Relative: 21 %
Lymphs Abs: 1.4 10*3/uL (ref 0.7–4.0)
MCH: 29.5 pg (ref 26.0–34.0)
MCHC: 33.3 g/dL (ref 30.0–36.0)
MCV: 88.6 fL (ref 80.0–100.0)
Monocytes Absolute: 0.6 10*3/uL (ref 0.1–1.0)
Monocytes Relative: 8 %
Neutro Abs: 4.7 10*3/uL (ref 1.7–7.7)
Neutrophils Relative %: 68 %
Platelet Count: 134 10*3/uL — ABNORMAL LOW (ref 150–400)
RBC: 4.37 MIL/uL (ref 3.87–5.11)
RDW: 14.8 % (ref 11.5–15.5)
WBC Count: 6.8 10*3/uL (ref 4.0–10.5)
nRBC: 0 % (ref 0.0–0.2)

## 2023-10-08 LAB — CMP (CANCER CENTER ONLY)
ALT: 49 U/L — ABNORMAL HIGH (ref 0–44)
AST: 42 U/L — ABNORMAL HIGH (ref 15–41)
Albumin: 4.4 g/dL (ref 3.5–5.0)
Alkaline Phosphatase: 80 U/L (ref 38–126)
Anion gap: 12 (ref 5–15)
BUN: 28 mg/dL — ABNORMAL HIGH (ref 8–23)
CO2: 22 mmol/L (ref 22–32)
Calcium: 8.6 mg/dL — ABNORMAL LOW (ref 8.9–10.3)
Chloride: 103 mmol/L (ref 98–111)
Creatinine: 1.98 mg/dL — ABNORMAL HIGH (ref 0.44–1.00)
GFR, Estimated: 27 mL/min — ABNORMAL LOW (ref >60)
Glucose, Bld: 140 mg/dL — ABNORMAL HIGH (ref 70–99)
Potassium: 4.3 mmol/L (ref 3.5–5.1)
Sodium: 137 mmol/L (ref 135–145)
Total Bilirubin: 0.4 mg/dL (ref 0.0–1.2)
Total Protein: 7.4 g/dL (ref 6.5–8.1)

## 2023-10-08 LAB — MAGNESIUM: Magnesium: 1.2 mg/dL — ABNORMAL LOW (ref 1.7–2.4)

## 2023-10-08 MED ORDER — HEPARIN SOD (PORK) LOCK FLUSH 100 UNIT/ML IV SOLN
500.0000 [IU] | Freq: Once | INTRAVENOUS | Status: AC
  Administered 2023-10-08: 500 [IU]
  Filled 2023-10-08: qty 5

## 2023-10-08 MED ORDER — SODIUM CHLORIDE 0.9 % IV SOLN
12.5000 mg | Freq: Once | INTRAVENOUS | Status: AC
  Administered 2023-10-08: 12.5 mg via INTRAVENOUS
  Filled 2023-10-08: qty 0.5

## 2023-10-08 MED ORDER — MAGNESIUM SULFATE 2 GM/50ML IV SOLN
2.0000 g | Freq: Once | INTRAVENOUS | Status: AC
  Administered 2023-10-08: 2 g via INTRAVENOUS
  Filled 2023-10-08: qty 50

## 2023-10-08 MED ORDER — SODIUM CHLORIDE 0.9% FLUSH
10.0000 mL | Freq: Once | INTRAVENOUS | Status: AC
  Administered 2023-10-08: 10 mL via INTRAVENOUS
  Filled 2023-10-08: qty 10

## 2023-10-08 MED ORDER — SODIUM CHLORIDE 0.9 % IV SOLN
INTRAVENOUS | Status: DC
  Filled 2023-10-08: qty 250

## 2023-10-08 MED ORDER — SODIUM CHLORIDE 0.9 % IV SOLN: 25.0000 mg | Freq: Once | INTRAVENOUS | Status: DC

## 2023-10-08 MED ORDER — CYANOCOBALAMIN 1000 MCG/ML IJ SOLN
1000.0000 ug | Freq: Once | INTRAMUSCULAR | Status: AC
  Administered 2023-10-08: 1000 ug via INTRAMUSCULAR
  Filled 2023-10-08: qty 1

## 2023-10-08 MED ORDER — MAGNESIUM SULFATE 4 GM/100ML IV SOLN
4.0000 g | Freq: Once | INTRAVENOUS | Status: AC
  Administered 2023-10-08: 4 g via INTRAVENOUS

## 2023-10-08 NOTE — Assessment & Plan Note (Signed)
 Kidney function is worse, ? Due to [facility].  Encourage oral hydration and avoid nephrotoxins.

## 2023-10-08 NOTE — Patient Instructions (Signed)
 Magnesium Sulfate Injection What is this medication? MAGNESIUM SULFATE (mag NEE zee um SUL fate) prevents and treats low levels of magnesium in your body. It may also be used to prevent and treat seizures during pregnancy in people with high blood pressure disorders, such as preeclampsia or eclampsia. Magnesium plays an important role in maintaining the health of your muscles and nervous system. This medicine may be used for other purposes; ask your health care provider or pharmacist if you have questions. What should I tell my care team before I take this medication? They need to know if you have any of these conditions: Heart disease History of irregular heart beat Kidney disease An unusual or allergic reaction to magnesium sulfate, medications, foods, dyes, or preservatives Pregnant or trying to get pregnant Breast-feeding How should I use this medication? This medication is for infusion into a vein. It is given in a hospital or clinic setting. Talk to your care team about the use of this medication in children. While this medication may be prescribed for selected conditions, precautions do apply. Overdosage: If you think you have taken too much of this medicine contact a poison control center or emergency room at once. NOTE: This medicine is only for you. Do not share this medicine with others. What if I miss a dose? This does not apply. What may interact with this medication? Certain medications for anxiety or sleep Certain medications for seizures, such phenobarbital Digoxin Medications that relax muscles for surgery Narcotic medications for pain This list may not describe all possible interactions. Give your health care provider a list of all the medicines, herbs, non-prescription drugs, or dietary supplements you use. Also tell them if you smoke, drink alcohol, or use illegal drugs. Some items may interact with your medicine. What should I watch for while using this  medication? Your condition will be monitored carefully while you are receiving this medication. You may need blood work done while you are receiving this medication. What side effects may I notice from receiving this medication? Side effects that you should report to your care team as soon as possible: Allergic reactions--skin rash, itching, hives, swelling of the face, lips, tongue, or throat High magnesium level--confusion, drowsiness, facial flushing, redness, sweating, muscle weakness, fast or irregular heartbeat, trouble breathing Low blood pressure--dizziness, feeling faint or lightheaded, blurry vision Side effects that usually do not require medical attention (report to your care team if they continue or are bothersome): Headache Nausea This list may not describe all possible side effects. Call your doctor for medical advice about side effects. You may report side effects to FDA at 1-800-FDA-1088. Where should I keep my medication? This medication is given in a hospital or clinic and will not be stored at home. NOTE: This sheet is a summary. It may not cover all possible information. If you have questions about this medicine, talk to your doctor, pharmacist, or health care provider.  2024 Elsevier/Gold Standard (2021-02-28 00:00:00)

## 2023-10-08 NOTE — Progress Notes (Signed)
 Hematology/Oncology Progress note [phone] [phone]     Chief Complaint: Sarah [name] is a 68 y.o. 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  Light chain ratio is normal. 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 Repeat immunofixation negative. . Labs reviewed and discussed with patient, stable M protein and stable light chain ratio Off Daratumumab due to frequent infections  Continue acyclovir prophylaxis.  B12 deficiency continue B12 injections Q 6 weeks.   Hypomagnesemia #Chronic hypomagnesemia proceed with  IV magnesium today.  Continue weekly magnesium +/- IV magnesium.     Iron deficiency anemia due to chronic blood loss Lab Results  Component Value Date   HGB 12.9 10/08/2023   TIBC 335 06/05/2023   IRONPCTSAT 7 (L) 06/05/2023   FERRITIN 73 06/05/2023    Hb has improved.    CKD (chronic kidney disease) stage 4, GFR 15-29 ml/min (HCC) Kidney function is worse, ? Due to [facility].  Encourage oral hydration and avoid nephrotoxins.      Orders Placed This Encounter  Procedures   CBC with Differential (Cancer Center Only)    Standing Status:   Future    Expected Date:   11/19/2023    Expiration Date:   10/07/2024   CMP (Cancer Center only)    Standing Status:   Future    Expected Date:   11/19/2023    Expiration Date:   10/07/2024   Multiple Myeloma Panel (SPEP&IFE w/QIG)    Standing Status:   Future    Expected Date:   11/19/2023    Expiration Date:   10/07/2024   Kappa/lambda light chains    Standing Status:   Future     Expected Date:   11/19/2023    Expiration Date:   10/07/2024   Vitamin B12    Standing Status:   Future    Expected Date:   11/19/2023    Expiration Date:   10/07/2024    Follow up  Lab Mag weekly x5  + IV mag.  6 weeks flex lab MD IV mag,  same labs  All questions were answered. The patient knows to call the clinic with any problems, questions or concerns.  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   Initial bone marrow revealed 80% plasma cells.  Lambda free light chains were 1340.  She had nephrotic range proteinuria.  She initially underwent induction with RVD.  Revlimid maintenance was discontinued on 01/21/2017 secondary to intolerance.     07/19/2019 Bone marrow aspirate and biopsy on  revealed a normocellular marrow with but increased lambda-restricted plasma cells (9% aspirate, 40% CD138 immunohistochemistry).  Findings were consistent with recurrent plasma cell myeloma.  Flow cytometry revealed no monoclonal B-cell or phenotypically aberrant T-cell population. Cytogenetics were 2, XX (normal).  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    Lambda light chains have been followed: 22.2 (ratio 0.56) on 07/03/2017, 30.8 (ratio 0.78) on 09/02/2017, 36.9 (ratio 0.40) on 10/21/2017, 37.4 (ratio 0.41) on 12/16/2017, 70.7(ratio 0.31)  on 02/17/2018, 64.2 (ratio 0.27) on 04/07/2018, 78.9 (ratio 0.18) on 05/26/2018, 128.8 (ratio 0.17) on 08/06/2018,  181.5 (ratio 0.13) on 10/08/2018, 130.9 (ratio 0.13) on 10/20/2018, 160.7 (ratio 0.10) on 12/09/2018, 236.6 (ratio 0.07) on 02/01/2019, 363.6 (ratio 0.04) on 03/22/2019, 404.8 (ratio 0.04) on 04/05/2019, 420.7 (ratio 0.03) on 05/24/2019, 573.4 (ratio 0.03) on 06/23/2019, 451.05 (ratio 0.02) on 08/20/2019, 47.2 (ratio 0.13) on 10/25/2019, 22.4 (ratio 0.21) on 11/25/2019, 16.5 (ratio 0.33) on 01/10/2020, 14.6 (ratio 0.34) on 02/07/2020, 13.1 (ratio 0.31) on 03/07/2020, 10.1 (ratio 0.38) on 04/10/2020, and 9.5 (ratio 0.21) on 06/19/2020.   24 hour UPEP on 06/03/2019 revealed kappa free light chains 95.76, lambda free light chains 1,260.71, and ratio 0.08.  24 hour UPEP on 08/23/2019 revealed total protein of 782 mg/24 hrs with lambda free light chains 1,084.16 mg/L and ratio of 0.10 (1.03-31.76).  M spike in urine was 46.1% (361 mg/24 hrs).    Bone survey on 04/08/2016 and 05/28/2017 revealed no definite lytic lesion seen in the visualized skeleton.  Bone survey on 11/19/2018 revealed no suspicious lucent lesions and no acute bony abnormality.  PET scan on 07/12/2019 revealed no focal metabolic activity to suggest active myeloma within the skeleton. There were no lytic lesions identified on the CT portion of the exam or soft tissue plasmacytomas. There was no evidence of multiple myeloma.    Pretreatment RBC phenotype on 09/23/2019 was positive for C, e, DUFFY B, KIDD B, M, S, and s antigen; negative for c, E, KELL, DUFFY A, KIDD A, and N antigen.    09/27/2019 - 10/25/2019; 12/09/2019 - 03/13/2020 6 cycles of daratumumab and hyaluronidase-fihj, Pomalyst, and Decadron (DPd) .  Cycle #1 was complicated by fever and neutropenia requiring admission.  Cycle #2 was complicated by pneumonia requiring admission.  Cycle #6 was complicated with an ER evaluation for an elevated lactic acid.   05/10/2020 second autologous stem cell transplant at [facility]   She underwent conditioning with melphalan 140 mg/m2.    She is s/p  week #3 Velcade maintenance (began 08/31/2020 - 09/28/2020).  She has no increase in neuropathy.  She has severe aortic stenosis.  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 Cardiology is following for a possible TAVR in the future.  Echo on 07/05/2020 revealed moderate to severe aortic stenosis with an EF of 50-55%.  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 Zometa was discontinued in 01/2017.  She has chronic nausea on Phenergan.     B12 deficiency.  B12 was 254 on 04/09/2017, 295 on 08/20/2018, and 391 on 10/08/2018.  She was on oral B12.  She received B12 monthly (last 09/14/2020).  Folate was 12.1 on 01/10/2020.    She has iron deficiency.  Ferritin was 32 on 07/01/2019.  She received Venofer on 07/15/2019 and 07/22/2019.   She has hypogammaglobulinemia.  IgG was 245 on 11/25/2019.  She received monthly IVIG (07/22/021 - 03/22/2020).  She received IVIG 400 mg/kg on 01/20/2020, 200 mg/kg on 02/17/2020, and 300 mg/kg on 03/22/2020.  IVIG  on 01/20/2020 was complicated by acute renal failure. IgG trough level was 418 on 02/17/2020.  10/15/2019 - 10/20/2019 She was admitted to [facility] with fever and neutropenia.  Cultures were negative.  CXR was negative. She received broad spectrum antibiotics and daily Granix.  She received IVF for acute renal insufficiency due to diarrhea and dehydration.  Creatine was 1.66 on admission and 1.12 on discharge.    03/01/2020 - 03/05/2020 admitted to [facility] from with fever and neutropenia. CXR revealed no active cardiopulmonary disease. Chest CT with contrast revealed no acute intrathoracic pathology. There were findings which could be suggestive of prior granulomatous disease. She was treated with Cefepime and Vancomycin, then switched to ciprofloxacin on 03/03/2020.   05/08/2020 - 12/05/2021The patient was admitted to [facility] from  for autologous bone marrow transplant.   She  received conditioning with melphalan 140 mg/m2.  Bone marrow transplant was on 05/10/2020. The patient developed fevers daily from 05/19/2020 - 05/24/2020. Blood cultures were + for strept sanguis bacteremia.  She was treated cefepime, vancomycin, and solumedrol for possible engraftment syndrome. Cefepime was switched to ceftriaxone; she completed 2 weeks of ceftriaxone on 06/03/2020. She had chemotherapy induced diarrhea.  She experienced urinary retention.  Feb 2022 patient has been on maintenance Velcade 2 mg every 2 weeks.  Chronic diarrhea seen by gastroenterology Dr. Tobi Bastos and was recommended to avoid artificial sugars in her diet including Diet Coke and coffee mate.  She feels some improvement of her diarrhea frequency since the dietary modification.  GI profile PCR negative, C. difficile toxin A+ B negative, fecal calprotectin 77 12/19/2020 colonoscopy showed 2 subcentimeter polyps in the ascending colon, resected and removed.  Pathology showed tubular adenoma.  No malignancy.  Normal mucosa in the entire examined colon.  Biopsied, pathology showed colonic mucosa with no significant pathological alteration.  Negative for active inflammation and features of chronicity.  Negative for microscopic colitis, dysplasia, and malignanc  Diabetes, patient has discontinued metformin due to diarrhea and started on glipizide 2.5 mg   03/05/2021-03/09/2021 Patient was hospitalized due to fever and chills, nonproductive cough, shortness of breath. X-ray showed right lower lobe atelectasis versus pneumonia.  Blood cultures negative.  Urine culture showed 60,000 colonies of Enterococcus facialis.  Patient received antibiotics. Patient also had abdominal pelvis CT scan for work-up of intermittent lower abdomen pain.No acute abnormality.   05/14/21 last dose of Velcade maintenance.  establish care with neurology Dr. Theora Master and her neuropathy regimen has been switched from gabapentin to Lyrica. 05/29/2021  patient got influenza   neuropathy medication has been switched from gabapentin/Lyrica to nortriptyline.  08/18/2021 Hospitalized due to pneumonia from metapnuemovirus, hospitalization was complicated with NSTEMI. She received heparin gtt.  Treated with antibiotics for coverage of pneumonia,  diuretics for pulmonary edema. She received IVIG 400mg /kg x 1 for hypoglobulinemia. Discharged on 08/25/21 with Augmentin and Zithromax.   May 2023, Daratumumab being held for [facility] infectious disease physician Dr. Kennedy Bucker due to frequent infection.  Diagnosed with CHF [HFrEF] -NYHA class II-III, she follows up with cardiology, started on entresto  hospitalization due to CHF exacerbation, community acquired pneumonia.   Presented to emergency room on 07/01/2022 Respiratory panel RT-PCR negative for COVID-19, influenza and RSV.   INTERVAL HISTORY Sarah [name] is a 68 y.o. female who has above history reviewed by me today presents for follow up visit for management of multiple myeloma.  Patient has been on weekly magnesium infusion as needed hypomagnesemia + CHF [nonischemic cardiomyopathy], aortic stenosis, follows up with cardiology. Today her  breathing is good.  No new complaints.   Past Medical History:  Diagnosis Date   Anemia    Anxiety    Aortic stenosis    a. 05/2020 Echo: EF 45-50%, sev AS - seen by TAVR team @ [facility] - CTA sugg of tricuspid valve w/ fusing of 2 leaflets-TAVR deferred in setting of acute infxn; b. 07/2020 Echo: EF 50-55%, mod-sev AS; c. 12/2020 Echo: EF>55%. Mod-sev paradoxical low-flow low-gradient AS; d. 08/2021 Echo: EF 35-40%, severe AS, triv AI, mild MR.   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 Variable EF over time; b. 08/2017 Echo: EF 40%; b. 03/2020 Echo: EF 55-60%; c. 05/2020 Echo: EF 45-50%; d. 07/2020 Echo: EF 50-55%; e. 12/2020 Echo: EF>55%; f.  08/2021 Echo: EF 35-40%.   Chronic heart failure with preserved ejection fraction (HFpEF) (HCC)    a. Variable EF over time; b. 08/2017 Echo: EF 40%; b. 03/2020 Echo: EF 55-60%; c. 05/2020 Echo: EF 45-50%; d. 07/2020 Echo: EF 50-55%; e. 12/2020 Echo: EF>55%; f. 08/2021 Echo: EF 35-40%, no RV, mildly dil LA, mild MR, triv AI, severe AS.   Chronic pain syndrome 07/08/2016   CKD (chronic kidney disease) stage 3, GFR 30-59 ml/min (HCC)    Depression    Diabetes mellitus (HCC)    Dizziness    Fatty liver    Frequent falls    GERD (gastroesophageal reflux disease)    Gout    Heart murmur    History of blood transfusion    History of bone marrow transplant (HCC)    History of uterine fibroid    Hx of cardiac catheterization    a. 01/2016 Cath [facility] - after abnl nuc): Nl cors.   Hypertension    Hypomagnesemia    IDA (iron deficiency anemia)    Multiple myeloma (HCC)    NSTEMI (non-ST elevated myocardial infarction) (HCC) 08/21/2021   Personal history of chemotherapy    Pleurisy 04/09/2022   PSVT (paroxysmal supraventricular tachycardia) (HCC)    a. S/p RFCA [facility]).   PVC's (premature ventricular contractions)    a. Well-managed w/ bisoprolol in outpt setting.   Renal cyst     Past Surgical History:  Procedure Laterality Date   ABDOMINAL HYSTERECTOMY     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   CARDIAC ELECTROPHYSIOLOGY MAPPING AND ABLATION     CARPAL TUNNEL RELEASE Bilateral    CHOLECYSTECTOMY  2008   COLONOSCOPY WITH PROPOFOL N/A 05/07/2017   Procedure: COLONOSCOPY WITH PROPOFOL;  Surgeon: Wyline Mood, MD;  Location: [facility] ENDOSCOPY;  Service: Gastroenterology;  Laterality: N/A;   COLONOSCOPY WITH PROPOFOL N/A 12/19/2020   Procedure: COLONOSCOPY WITH PROPOFOL;  Surgeon: Wyline Mood, MD;  Location: [facility] ENDOSCOPY;  Service: Gastroenterology;  Laterality: N/A;   ESOPHAGOGASTRODUODENOSCOPY (EGD) WITH PROPOFOL N/A 05/07/2017   Procedure: ESOPHAGOGASTRODUODENOSCOPY (EGD) WITH PROPOFOL;   Surgeon: Wyline Mood, MD;  Location: [facility] ENDOSCOPY;  Service: Gastroenterology;  Laterality: N/A;   FOOT SURGERY Bilateral    INCONTINENCE SURGERY  2009   INTERSTIM IMPLANT PLACEMENT     other     over active bladder   OTHER SURGICAL HISTORY     bladder stimulator    PARTIAL HYSTERECTOMY  03/1996   fibroids   PORTA CATH INSERTION N/A 03/10/2019   Procedure: PORTA CATH INSERTION;  Surgeon: Annice Needy, MD;  Location: [facility] INVASIVE CV LAB;  Service: Cardiovascular;  Laterality: N/A;   TONSILLECTOMY  2007    Family History  Problem Relation Age of Onset   Colon cancer Father    Renal Disease Father    Diabetes Mellitus II Father    Melanoma Paternal Grandmother    Breast cancer Maternal Aunt 49   Anemia Mother    Heart disease Mother    Heart failure Mother    Renal Disease Mother    Congestive Heart Failure Mother    Heart disease Maternal Uncle    Throat cancer Maternal Uncle    Lung cancer Maternal Uncle    Liver disease Maternal Uncle    Heart failure Maternal Uncle    Hearing loss Son 40       Suicide     Social History:  reports that she quit smoking about 32 years ago. Her smoking use included cigarettes. She started smoking about 52 years ago. She has a 20 pack-year smoking history. She has never used smokeless tobacco. She reports current alcohol use of about 2.0 standard drinks of alcohol per week. She reports that she does not use drugs.  She is on disability. She notes exposure to perchloroethylene [facility]).   Allergies:  Allergies  Allergen Reactions   Oxycodone-Acetaminophen Anaphylaxis    Swelling and rash   Celebrex [Celecoxib] Diarrhea   Codeine    Plerixafor     In 2016 during ASCT collection patient developed fever to 103.48F and required hospitalization   Benadryl [Diphenhydramine] Palpitations   Morphine Itching and Rash   Ondansetron Diarrhea   Tylenol [Acetaminophen] Itching and Rash    Current Medications: Current Outpatient Medications   Medication Sig Dispense Refill   allopurinol (ZYLOPRIM) 100 MG tablet TAKE 1 TABLET(100 MG) BY MOUTH DAILY as needed 90 tablet 1   amiodarone (PACERONE) 200 MG tablet Take 200 mg by mouth daily.     atorvastatin (LIPITOR) 10 MG tablet Take 0.5 tablets (5 mg total) by mouth daily. 45 tablet 1   bisoprolol (ZEBETA) 10 MG tablet Take 1 tablet (10 mg total) by mouth daily. 90 tablet 1   calcium carbonate (OS-CAL) 1250 (500 Ca) MG chewable tablet Chew 2 tablets by mouth daily.     celecoxib (CELEBREX) 200 MG capsule Take 1 capsule by mouth 2 (two) times daily.     cholestyramine (QUESTRAN) 4 GM/DOSE powder Take 1 packet (4 g total) by mouth 2 (two) times daily with a meal. 240 packet 2   diazepam (VALIUM) 5 MG tablet Take 0.5-1 tablets (2.5-5 mg total) by mouth at bedtime as needed (insomnia). 30 tablet 3   diclofenac sodium (VOLTAREN) 1 % GEL Apply 2 g topically 4 (four) times daily. 100 g 1   diphenoxylate-atropine (LOMOTIL) 2.5-0.025 MG tablet Take 2 tablets by mouth 4 (four) times daily as needed for diarrhea or loose stools. 180 tablet 3   ENTRESTO 24-26 MG Take 1 tablet by mouth 2 (two) times daily.     FLUoxetine (PROZAC) 40 MG capsule Take 1 capsule (40 mg total) by mouth daily. 90 capsule 0   furosemide (LASIX) 40 MG tablet Take 0.5 tablets (20 mg total) by mouth daily. Home dose.     glipiZIDE (GLUCOTROL XL) 2.5 MG 24 hr tablet Take 1 tablet (2.5 mg total) by mouth daily with breakfast. 90 tablet 1   glucose blood test strip      JARDIANCE 10 MG TABS tablet Take 10 mg by mouth daily.     lidocaine (XYLOCAINE) 2 % solution  Use as directed 15 mLs in the mouth or throat every 3 (three) hours as needed for mouth pain (swish and spit). 100 mL 0   magnesium chloride (SLOW-MAG) 64 MG TBEC SR tablet Take 1 tablet (64 mg total) by mouth daily. 60 tablet 1   Multiple Vitamin (MULTIVITAMIN) tablet Take 1 tablet by mouth daily.     omeprazole (PRILOSEC) 40 MG capsule TAKE 1 CAPSULE IN THE MORNING AND  TAKE 1 CAPSULE AT BEDTIME 180 capsule 3   pentoxifylline (TRENTAL) 400 MG CR tablet Take 400 mg by mouth 3 (three) times daily with meals.     potassium chloride SA (KLOR-CON M) 20 MEQ tablet Take 1 tablet (20 mEq total) by mouth daily. 7 tablet 0   promethazine (PHENERGAN) 25 MG tablet Take 1 tablet (25 mg total) by mouth every 6 (six) hours as needed for nausea or vomiting. 90 tablet 1   spironolactone (ALDACTONE) 25 MG tablet Take by mouth.     tiZANidine (ZANAFLEX) 4 MG tablet Take 1 tablet (4 mg total) by mouth every 6 (six) hours as needed for muscle spasms. 90 tablet 1   traMADol (ULTRAM) 50 MG tablet      traZODone (DESYREL) 100 MG tablet TAKE 1 TABLET AT BEDTIME 90 tablet 3   vitamin E 45 MG (100 UNITS) capsule Take by mouth daily.     losartan (COZAAR) 25 MG tablet Take 1 tablet by mouth daily. (Patient not taking: Reported on 10/08/2023)     No current facility-administered medications for this visit.   Facility-Administered Medications Ordered in Other Visits  Medication Dose Route Frequency Provider Last Rate Last Admin   0.9 %  sodium chloride infusion   Intravenous Continuous Rickard Patience, MD   Stopped at 10/08/23 1602   sodium chloride flush (NS) 0.9 % injection 10 mL  10 mL Intravenous PRN Rickard Patience, MD   10 mL at 04/02/21 0904   sodium chloride flush (NS) 0.9 % injection 10 mL  10 mL Intracatheter Once PRN Rickard Patience, MD        Review of Systems  Constitutional:  Negative for chills, fever, malaise/fatigue and weight loss.  HENT:  Negative for congestion and sore throat.   Eyes:  Negative for redness.  Respiratory:  Negative for cough, shortness of breath and wheezing.   Cardiovascular:  Negative for chest pain, palpitations and leg swelling.  Gastrointestinal:  Positive for diarrhea. Negative for abdominal pain, blood in stool, nausea and vomiting.  Genitourinary:  Negative for dysuria.  Musculoskeletal:  Positive for back pain and joint pain. Negative for myalgias.  Skin:   Negative for rash.  Neurological:  Positive for tingling and sensory change. Negative for dizziness and tremors.  Endo/Heme/Allergies:  Does not bruise/bleed easily.  Psychiatric/Behavioral:  Negative for hallucinations.     Performance status (ECOG): 1  Vitals Blood pressure (!) 111/45, pulse (!) 56, temperature (!) 97 F (36.1 C), temperature source Tympanic, resp. rate 18, weight 166 lb 6.4 oz (75.5 kg).  Physical Exam Constitutional:      General: She is not in acute distress.    Appearance: She is not diaphoretic.  HENT:     Head: Normocephalic and atraumatic.  Eyes:     General: No scleral icterus. Cardiovascular:     Rate and Rhythm: Normal rate.     Heart sounds: Murmur heard.  Pulmonary:     Effort: Pulmonary effort is normal. No respiratory distress.     Breath sounds: Normal breath sounds. No  wheezing.  Abdominal:     General: There is no distension.  Musculoskeletal:        General: Normal range of motion.     Cervical back: Normal range of motion and neck supple.  Skin:    General: Skin is warm and dry.     Findings: No erythema.     Comments: Lower extremity cat bite cellulitis has improved.   Neurological:     Mental Status: She is alert and oriented to person, place, and time. Mental status is at baseline.     Motor: No abnormal muscle tone.  Psychiatric:        Mood and Affect: Mood and affect normal.      Laboratory data    Latest Ref Rng & Units 10/08/2023   10:34 AM 08/28/2023   10:06 AM 07/17/2023    9:58 AM  CBC  WBC 4.0 - 10.5 K/uL 6.8  6.3  6.3   Hemoglobin 12.0 - 15.0 g/dL 40.9  81.1  91.4   Hematocrit 36.0 - 46.0 % 38.7  36.8  34.8   Platelets 150 - 400 K/uL 134  110  126       Latest Ref Rng & Units 10/08/2023   10:34 AM 08/28/2023   10:06 AM 07/17/2023    9:58 AM  CMP  Glucose 70 - 99 mg/dL 782  956  213   BUN 8 - 23 mg/dL 28  28  35   Creatinine 0.44 - 1.00 mg/dL 0.86  5.78  4.69   Sodium 135 - 145 mmol/L 137  137  135   Potassium  3.5 - 5.1 mmol/L 4.3  5.0  4.4   Chloride 98 - 111 mmol/L 103  104  102   CO2 22 - 32 mmol/L 22  24  23    Calcium 8.9 - 10.3 mg/dL 8.6  9.4  9.2   Total Protein 6.5 - 8.1 g/dL 7.4  7.6  7.1   Total Bilirubin 0.0 - 1.2 mg/dL 0.4  0.4  0.5   Alkaline Phos 38 - 126 U/L 80  105  89   AST 15 - 41 U/L 42  34  24   ALT 0 - 44 U/L 49  46  27

## 2023-10-08 NOTE — Assessment & Plan Note (Addendum)
 continue B12 injections Q 6 weeks.

## 2023-10-08 NOTE — Assessment & Plan Note (Signed)
#  Chronic hypomagnesemia proceed with  IV magnesium today.  Continue weekly magnesium +/- IV magnesium.

## 2023-10-08 NOTE — Assessment & Plan Note (Signed)
 Lab Results  Component Value Date   HGB 12.9 10/08/2023   TIBC 335 06/05/2023   IRONPCTSAT 7 (L) 06/05/2023   FERRITIN 73 06/05/2023    Hb has improved.

## 2023-10-08 NOTE — Assessment & Plan Note (Addendum)
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  Light chain ratio is normal. 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 Repeat immunofixation negative. . Labs reviewed and discussed with patient, stable M protein and stable light chain ratio Off Daratumumab due to frequent infections  Continue acyclovir prophylaxis.

## 2023-10-10 ENCOUNTER — Encounter: Payer: Self-pay | Admitting: Internal Medicine

## 2023-10-10 ENCOUNTER — Encounter: Payer: Self-pay | Admitting: Oncology

## 2023-10-10 LAB — KAPPA/LAMBDA LIGHT CHAINS
Kappa free light chain: 32.4 mg/L — ABNORMAL HIGH (ref 3.3–19.4)
Kappa, lambda light chain ratio: 1.19 (ref 0.26–1.65)
Lambda free light chains: 27.2 mg/L — ABNORMAL HIGH (ref 5.7–26.3)

## 2023-10-10 LAB — MULTIPLE MYELOMA PANEL, SERUM
Albumin SerPl Elph-Mcnc: 4.2 g/dL (ref 2.9–4.4)
Albumin/Glob SerPl: 1.6 (ref 0.7–1.7)
Alpha 1: 0.2 g/dL (ref 0.0–0.4)
Alpha2 Glob SerPl Elph-Mcnc: 0.9 g/dL (ref 0.4–1.0)
B-Globulin SerPl Elph-Mcnc: 1.1 g/dL (ref 0.7–1.3)
Gamma Glob SerPl Elph-Mcnc: 0.6 g/dL (ref 0.4–1.8)
Globulin, Total: 2.8 g/dL (ref 2.2–3.9)
IgA: 244 mg/dL (ref 87–352)
IgG (Immunoglobin G), Serum: 670 mg/dL (ref 586–1602)
IgM (Immunoglobulin M), Srm: 29 mg/dL (ref 26–217)
Total Protein ELP: 7 g/dL (ref 6.0–8.5)

## 2023-10-16 ENCOUNTER — Inpatient Hospital Stay

## 2023-10-16 VITALS — BP 131/58 | HR 60 | Temp 96.6°F | Resp 20

## 2023-10-16 DIAGNOSIS — Z95828 Presence of other vascular implants and grafts: Secondary | ICD-10-CM

## 2023-10-16 DIAGNOSIS — C9001 Multiple myeloma in remission: Secondary | ICD-10-CM | POA: Diagnosis not present

## 2023-10-16 LAB — MAGNESIUM: Magnesium: 1.5 mg/dL — ABNORMAL LOW (ref 1.7–2.4)

## 2023-10-16 MED ORDER — SODIUM CHLORIDE 0.9 % IV SOLN
INTRAVENOUS | Status: DC
  Filled 2023-10-16: qty 250

## 2023-10-16 MED ORDER — MAGNESIUM SULFATE 4 GM/100ML IV SOLN
4.0000 g | Freq: Once | INTRAVENOUS | Status: AC
  Administered 2023-10-16: 4 g via INTRAVENOUS
  Filled 2023-10-16: qty 100

## 2023-10-16 MED ORDER — SODIUM CHLORIDE 0.9 % IV SOLN
12.5000 mg | Freq: Once | INTRAVENOUS | Status: AC
  Administered 2023-10-16: 12.5 mg via INTRAVENOUS
  Filled 2023-10-16: qty 0.5

## 2023-10-16 MED ORDER — HEPARIN SOD (PORK) LOCK FLUSH 100 UNIT/ML IV SOLN
500.0000 [IU] | Freq: Once | INTRAVENOUS | Status: AC
  Administered 2023-10-16: 500 [IU]
  Filled 2023-10-16: qty 5

## 2023-10-17 ENCOUNTER — Encounter: Payer: Self-pay | Admitting: Internal Medicine

## 2023-10-17 NOTE — Telephone Encounter (Signed)
 Please review and advise.

## 2023-10-21 ENCOUNTER — Telehealth: Payer: Self-pay

## 2023-10-21 NOTE — Telephone Encounter (Signed)
 Copied from CRM [id number]. Topic: Referral - Request for Referral >> Oct 21, 2023  2:12 PM Yolanda T wrote: Did the patient discuss referral with their provider in the last year? Yes  Appointment offered? No  Type of order/referral and detailed reason for visit: Neurologist for stroke vs migraine  Preference of office, provider, location: [facility]   If referral order, have you been seen by this specialty before? No (If Yes, this issue or another issue? When? Where?  Can we respond through MyChart? Yes  Jullie Oiler from [facility] next to [facility] in [place] stated provider stated this would be an urgent referral

## 2023-10-22 NOTE — Telephone Encounter (Signed)
 Called patient, no answer to my phone call to obtain details of the reasoning for the referral. Left VM message to call back.

## 2023-10-23 ENCOUNTER — Encounter: Payer: Self-pay | Admitting: Internal Medicine

## 2023-10-23 ENCOUNTER — Encounter: Payer: Self-pay | Admitting: Oncology

## 2023-10-23 ENCOUNTER — Inpatient Hospital Stay

## 2023-10-23 DIAGNOSIS — C9001 Multiple myeloma in remission: Secondary | ICD-10-CM

## 2023-10-23 LAB — MAGNESIUM: Magnesium: 1.4 mg/dL — ABNORMAL LOW (ref 1.7–2.4)

## 2023-10-23 MED ORDER — MAGNESIUM SULFATE 4 GM/100ML IV SOLN
4.0000 g | Freq: Once | INTRAVENOUS | Status: AC
  Administered 2023-10-23: 4 g via INTRAVENOUS
  Filled 2023-10-23: qty 100

## 2023-10-23 MED ORDER — SODIUM CHLORIDE 0.9 % IV SOLN
12.5000 mg | Freq: Once | INTRAVENOUS | Status: AC
  Administered 2023-10-23: 12.5 mg via INTRAVENOUS
  Filled 2023-10-23: qty 0.5

## 2023-10-23 MED ORDER — HEPARIN SOD (PORK) LOCK FLUSH 100 UNIT/ML IV SOLN
500.0000 [IU] | Freq: Once | INTRAVENOUS | Status: AC | PRN
  Administered 2023-10-23: 500 [IU]
  Filled 2023-10-23: qty 5

## 2023-10-23 MED ORDER — SODIUM CHLORIDE 0.9 % IV SOLN
INTRAVENOUS | Status: DC
  Filled 2023-10-23: qty 250

## 2023-10-23 NOTE — Telephone Encounter (Signed)
 Called patient, no answer to my phone call to obtain details of the reasoning for the referral.

## 2023-10-23 NOTE — Telephone Encounter (Signed)
Will send MyChart message

## 2023-10-23 NOTE — Patient Instructions (Signed)
 Magnesium Sulfate Injection What is this medication? MAGNESIUM SULFATE (mag NEE zee um SUL fate) prevents and treats low levels of magnesium in your body. It may also be used to prevent and treat seizures during pregnancy in people with high blood pressure disorders, such as preeclampsia or eclampsia. Magnesium plays an important role in maintaining the health of your muscles and nervous system. This medicine may be used for other purposes; ask your health care provider or pharmacist if you have questions. What should I tell my care team before I take this medication? They need to know if you have any of these conditions: Heart disease History of irregular heart beat Kidney disease An unusual or allergic reaction to magnesium sulfate, medications, foods, dyes, or preservatives Pregnant or trying to get pregnant Breast-feeding How should I use this medication? This medication is for infusion into a vein. It is given in a hospital or clinic setting. Talk to your care team about the use of this medication in children. While this medication may be prescribed for selected conditions, precautions do apply. Overdosage: If you think you have taken too much of this medicine contact a poison control center or emergency room at once. NOTE: This medicine is only for you. Do not share this medicine with others. What if I miss a dose? This does not apply. What may interact with this medication? Certain medications for anxiety or sleep Certain medications for seizures, such phenobarbital Digoxin Medications that relax muscles for surgery Narcotic medications for pain This list may not describe all possible interactions. Give your health care provider a list of all the medicines, herbs, non-prescription drugs, or dietary supplements you use. Also tell them if you smoke, drink alcohol, or use illegal drugs. Some items may interact with your medicine. What should I watch for while using this  medication? Your condition will be monitored carefully while you are receiving this medication. You may need blood work done while you are receiving this medication. What side effects may I notice from receiving this medication? Side effects that you should report to your care team as soon as possible: Allergic reactions--skin rash, itching, hives, swelling of the face, lips, tongue, or throat High magnesium level--confusion, drowsiness, facial flushing, redness, sweating, muscle weakness, fast or irregular heartbeat, trouble breathing Low blood pressure--dizziness, feeling faint or lightheaded, blurry vision Side effects that usually do not require medical attention (report to your care team if they continue or are bothersome): Headache Nausea This list may not describe all possible side effects. Call your doctor for medical advice about side effects. You may report side effects to FDA at 1-800-FDA-1088. Where should I keep my medication? This medication is given in a hospital or clinic and will not be stored at home. NOTE: This sheet is a summary. It may not cover all possible information. If you have questions about this medicine, talk to your doctor, pharmacist, or health care provider.  2024 Elsevier/Gold Standard (2021-02-28 00:00:00)

## 2023-10-24 ENCOUNTER — Encounter: Payer: Self-pay | Admitting: Podiatry

## 2023-10-24 ENCOUNTER — Ambulatory Visit (INDEPENDENT_AMBULATORY_CARE_PROVIDER_SITE_OTHER): Admitting: Internal Medicine

## 2023-10-24 ENCOUNTER — Ambulatory Visit (INDEPENDENT_AMBULATORY_CARE_PROVIDER_SITE_OTHER): Payer: Medicare PPO | Admitting: Podiatry

## 2023-10-24 ENCOUNTER — Encounter: Payer: Self-pay | Admitting: Internal Medicine

## 2023-10-24 VITALS — Ht 63.0 in | Wt 166.4 lb

## 2023-10-24 VITALS — BP 110/64 | HR 60 | Ht 63.0 in | Wt 166.4 lb

## 2023-10-24 DIAGNOSIS — G453 Amaurosis fugax: Secondary | ICD-10-CM

## 2023-10-24 DIAGNOSIS — I2510 Atherosclerotic heart disease of native coronary artery without angina pectoris: Secondary | ICD-10-CM | POA: Diagnosis not present

## 2023-10-24 DIAGNOSIS — I350 Nonrheumatic aortic (valve) stenosis: Secondary | ICD-10-CM

## 2023-10-24 DIAGNOSIS — H532 Diplopia: Secondary | ICD-10-CM | POA: Diagnosis not present

## 2023-10-24 DIAGNOSIS — I6523 Occlusion and stenosis of bilateral carotid arteries: Secondary | ICD-10-CM | POA: Diagnosis not present

## 2023-10-24 DIAGNOSIS — M79675 Pain in left toe(s): Secondary | ICD-10-CM | POA: Diagnosis not present

## 2023-10-24 DIAGNOSIS — I10 Essential (primary) hypertension: Secondary | ICD-10-CM | POA: Diagnosis not present

## 2023-10-24 DIAGNOSIS — M79674 Pain in right toe(s): Secondary | ICD-10-CM | POA: Diagnosis not present

## 2023-10-24 DIAGNOSIS — B351 Tinea unguium: Secondary | ICD-10-CM

## 2023-10-24 DIAGNOSIS — C9001 Multiple myeloma in remission: Secondary | ICD-10-CM

## 2023-10-24 HISTORY — DX: Amaurosis fugax: G45.3

## 2023-10-24 MED ORDER — ROSUVASTATIN CALCIUM 5 MG PO TABS: 5.0000 mg | ORAL_TABLET | Freq: Every day | ORAL | 1 refills | Status: DC

## 2023-10-24 NOTE — Telephone Encounter (Signed)
 Spoke with patient, she stated she was having blurry vision, vision lost at times and went to [facility] to be seen. They recommended that she contact PCP to get a referral for neurology for possible eye stroke or migraine concerns. Scheduled patient to come in to the office to be evaluated by PCP. Patient verbalized understanding.

## 2023-10-24 NOTE — Assessment & Plan Note (Signed)
 Blood pressure is well controlled.  Current medications bisoprolol  and Coreg, Entresto . Will continue same regimen along with efforts to limit dietary sodium.

## 2023-10-24 NOTE — Progress Notes (Signed)
 Date:  10/24/2023   Name:  Sarah [name]   DOB:  [birth year]   MRN:  [id number]   Chief Complaint: Referral (Patient said that last Thursday she loss vision in her left eye for about 5 minutes, has had blurry vision, went to eye doctor and he recommended a referral to neurology)  AVS - with dilated CM - TAVR planned at [facility]  Hypertension This is a chronic problem. The problem is controlled. Pertinent negatives include no chest pain, headaches, palpitations or shortness of breath.  Hyperlipidemia This is a chronic problem. The problem is uncontrolled. Recent lipid tests were reviewed and are high. Pertinent negatives include no chest pain or shortness of breath.  Eye Problem  The left eye is affected. This is a recurrent (2 episodes) problem. Pertinent negatives include no eye discharge, eye redness or fever.  She had an episode of darkening of vision in left eye lasting about 5 minutes - no pain or subsequent headache.  She went to the eye doctor and had a normal exam of the retina, pressures and visual fields.  She was advised to see a neurologist.  Carolin Chyle she had a 5 min episode of double vision that resolved if she closed either eye.   Review of Systems  Constitutional:  Negative for chills and fever.  HENT:  Positive for trouble swallowing.   Eyes:  Positive for visual disturbance. Negative for pain, discharge and redness.  Respiratory:  Negative for chest tightness and shortness of breath.   Cardiovascular:  Negative for chest pain, palpitations and leg swelling.  Gastrointestinal:  Negative for abdominal pain.  Neurological:  Positive for dizziness and light-headedness. Negative for headaches.  Psychiatric/Behavioral:  Negative for sleep disturbance. The patient is not nervous/anxious.      Lab Results  Component Value Date   NA 137 10/08/2023   K 4.3 10/08/2023   CO2 22 10/08/2023   GLUCOSE 140 (H) 10/08/2023   BUN 28 (H) 10/08/2023   CREATININE 1.98 (H) 10/08/2023    CALCIUM  8.6 (L) 10/08/2023   GFRNONAA 27 (L) 10/08/2023   Lab Results  Component Value Date   CHOL 164 12/11/2022   HDL 67 12/11/2022   LDLCALC 83 12/11/2022   TRIG 68 12/11/2022   CHOLHDL 2.4 12/11/2022   Lab Results  Component Value Date   TSH 1.450 07/22/2022   Lab Results  Component Value Date   HGBA1C 6.2 (A) 06/02/2023   Lab Results  Component Value Date   WBC 6.8 10/08/2023   HGB 12.9 10/08/2023   HCT 38.7 10/08/2023   MCV 88.6 10/08/2023   PLT 134 (L) 10/08/2023   Lab Results  Component Value Date   ALT 49 (H) 10/08/2023   AST 42 (H) 10/08/2023   ALKPHOS 80 10/08/2023   BILITOT 0.4 10/08/2023   Lab Results  Component Value Date   VD25OH 25.64 (L) 12/11/2022     Patient Active Problem List   Diagnosis Date Noted   Amaurosis fugax of left eye 10/24/2023   Double vision with both eyes open 10/24/2023   CKD (chronic kidney disease) stage 4, GFR 15-29 ml/min (HCC) 10/08/2023   Jaw pain 03/13/2023   Prolonged QT interval 03/07/2023   Vitamin D  deficiency 12/09/2022   Diabetes mellitus treated with oral medication (HCC) 12/09/2022   NICM (nonischemic cardiomyopathy) (HCC) 08/05/2022   Stage 3b chronic kidney disease (CKD) (HCC) 04/09/2022   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   Overweight (BMI 25.0-29.9) 02/04/2022   Complete left bundle branch block 09/06/2021  Anemia of chronic renal failure, stage 3a (HCC) 09/06/2021   Gastroesophageal reflux disease without esophagitis 09/06/2021   Chronic systolic CHF (congestive heart failure) (HCC) 08/22/2021   Low immunoglobulin level    Multiple myeloma in remission (HCC) 05/14/2021   Neuropathy 04/02/2021   Frequent PVCs 12/04/2020   Coronary artery disease involving native coronary artery of native heart 04/26/2020   Hypokalemia 03/11/2020   Physical deconditioning 02/07/2020   Impaired nutrition 02/07/2020   S/P autologous bone marrow transplantation (HCC) 09/10/2019   Hypocalcemia 08/28/2019   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) 12/17/2018   Hyperlipidemia associated with type 2 diabetes mellitus (HCC) 11/25/2018   Hypomagnesemia 08/20/2018   Goals of care, counseling/discussion 08/20/2018   B12 deficiency 08/20/2018   Thrombocytopenia (HCC) 02/09/2018   Benign essential HTN 08/21/2017   Carotid artery stenosis, asymptomatic, bilateral 08/06/2017   Thyroid  nodule 08/06/2017   Iron  deficiency anemia due to chronic blood loss 05/13/2017   Spinal stenosis of lumbar region with neurogenic claudication 04/09/2017   Long term prescription opiate use 09/07/2016   Pain medication agreement signed 07/08/2016   Spondylosis of lumbar region without myelopathy or radiculopathy 07/08/2016   Nonrheumatic aortic valve stenosis 06/05/2016   Environmental and seasonal allergies 04/19/2016   Insomnia 04/19/2016   Asthma 04/19/2016   Aortic regurgitation 04/19/2016   Type II diabetes mellitus with complication (HCC) 04/12/2016   Depression, major, single episode, in partial remission (HCC) 04/12/2016   Irritable bowel syndrome (IBS) 04/12/2016   Hepatic steatosis 04/12/2016   Multiple myeloma in remission (HCC) 04/02/2016   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HCC) 07/06/2015   History of stem cell transplant (HCC) 05/16/2015    Allergies  Allergen Reactions   Oxycodone-Acetaminophen  Anaphylaxis    Swelling and rash  acetaminophen  / oxycodone   Celebrex  [Celecoxib ] Diarrhea   Codeine     Plerixafor     In 2016 during ASCT collection patient developed fever to 103.83F and required hospitalization   Benadryl  [Diphenhydramine ] Palpitations   Morphine  Itching and Rash   Ondansetron  Diarrhea   Tylenol  [Acetaminophen ] Itching and Rash    Past Surgical History:  Procedure Laterality Date   ABDOMINAL HYSTERECTOMY     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   CARDIAC ELECTROPHYSIOLOGY MAPPING AND ABLATION     CARPAL TUNNEL RELEASE Bilateral    CHOLECYSTECTOMY  2008   COLONOSCOPY WITH PROPOFOL  N/A  05/07/2017   Procedure: COLONOSCOPY WITH PROPOFOL ;  Surgeon: Luke Salaam, MD;  Location: [facility] ENDOSCOPY;  Service: Gastroenterology;  Laterality: N/A;   COLONOSCOPY WITH PROPOFOL  N/A 12/19/2020   Procedure: COLONOSCOPY WITH PROPOFOL ;  Surgeon: Luke Salaam, MD;  Location: [facility] ENDOSCOPY;  Service: Gastroenterology;  Laterality: N/A;   ESOPHAGOGASTRODUODENOSCOPY (EGD) WITH PROPOFOL  N/A 05/07/2017   Procedure: ESOPHAGOGASTRODUODENOSCOPY (EGD) WITH PROPOFOL ;  Surgeon: Luke Salaam, MD;  Location: [facility] ENDOSCOPY;  Service: Gastroenterology;  Laterality: N/A;   FOOT SURGERY Bilateral    INCONTINENCE SURGERY  2009   INTERSTIM IMPLANT PLACEMENT     other     over active bladder   OTHER SURGICAL HISTORY     bladder stimulator    PARTIAL HYSTERECTOMY  03/1996   fibroids   PORTA CATH INSERTION N/A 03/10/2019   Procedure: PORTA CATH INSERTION;  Surgeon: Celso College, MD;  Location: [facility] INVASIVE CV LAB;  Service: Cardiovascular;  Laterality: N/A;   TONSILLECTOMY  2007    Social History   Tobacco Use   Smoking status: Former    Current packs/day: 0.00    Average packs/day:  1 pack/day for 20.0 years (20.0 ttl pk-yrs)    Types: Cigarettes    Start date: 07/02/1971    Quit date: 07/02/1991    Years since quitting: 32.3   Smokeless tobacco: Never  Vaping Use   Vaping status: Never Used  Substance Use Topics   Alcohol use: Yes    Alcohol/week: 2.0 standard drinks of alcohol    Types: 2 Cans of beer per week   Drug use: No     Medication list has been reviewed and updated.  Current Meds  Medication Sig   allopurinol  (ZYLOPRIM ) 100 MG tablet TAKE 1 TABLET(100 MG) BY MOUTH DAILY as needed   amiodarone (PACERONE) 200 MG tablet Take 200 mg by mouth daily.   bisoprolol  (ZEBETA ) 10 MG tablet Take 1 tablet (10 mg total) by mouth daily.   calcium  carbonate (OS-CAL) 1250 (500 Ca) MG chewable tablet Chew 2 tablets by mouth daily.   celecoxib  (CELEBREX ) 200 MG capsule Take 1 capsule by mouth 2 (two) times  daily.   cholestyramine  (QUESTRAN ) 4 GM/DOSE powder Take 1 packet (4 g total) by mouth 2 (two) times daily with a meal.   diazepam  (VALIUM ) 5 MG tablet Take 0.5-1 tablets (2.5-5 mg total) by mouth at bedtime as needed (insomnia).   diclofenac  sodium (VOLTAREN ) 1 % GEL Apply 2 g topically 4 (four) times daily.   diphenoxylate -atropine  (LOMOTIL ) 2.5-0.025 MG tablet Take 2 tablets by mouth 4 (four) times daily as needed for diarrhea or loose stools.   ENTRESTO  24-26 MG Take 1 tablet by mouth 2 (two) times daily.   FLUoxetine  (PROZAC ) 40 MG capsule Take 1 capsule (40 mg total) by mouth daily.   furosemide  (LASIX ) 40 MG tablet Take 0.5 tablets (20 mg total) by mouth daily. Home dose.   glipiZIDE  (GLUCOTROL  XL) 2.5 MG 24 hr tablet Take 1 tablet (2.5 mg total) by mouth daily with breakfast.   glucose blood test strip    JARDIANCE 10 MG TABS tablet Take 10 mg by mouth daily.   lidocaine  (XYLOCAINE ) 2 % solution Use as directed 15 mLs in the mouth or throat every 3 (three) hours as needed for mouth pain (swish and spit).   losartan (COZAAR) 25 MG tablet Take 1 tablet by mouth daily.   magnesium  chloride (SLOW-MAG) 64 MG TBEC SR tablet Take 1 tablet (64 mg total) by mouth daily.   Multiple Vitamin (MULTIVITAMIN) tablet Take 1 tablet by mouth daily.   omeprazole  (PRILOSEC) 40 MG capsule TAKE 1 CAPSULE IN THE MORNING AND TAKE 1 CAPSULE AT BEDTIME   pentoxifylline  (TRENTAL ) 400 MG CR tablet Take 400 mg by mouth 3 (three) times daily with meals.   potassium chloride  SA (KLOR-CON  M) 20 MEQ tablet Take 1 tablet (20 mEq total) by mouth daily.   promethazine  (PHENERGAN ) 25 MG tablet Take 1 tablet (25 mg total) by mouth every 6 (six) hours as needed for nausea or vomiting.   rosuvastatin (CRESTOR) 5 MG tablet Take 1 tablet (5 mg total) by mouth daily.   spironolactone (ALDACTONE) 25 MG tablet Take by mouth.   tiZANidine  (ZANAFLEX ) 4 MG tablet Take 1 tablet (4 mg total) by mouth every 6 (six) hours as needed for  muscle spasms.   traMADol  (ULTRAM ) 50 MG tablet    traZODone  (DESYREL ) 100 MG tablet TAKE 1 TABLET AT BEDTIME   vitamin E 45 MG (100 UNITS) capsule Take by mouth daily.   [DISCONTINUED] atorvastatin  (LIPITOR) 10 MG tablet Take 0.5 tablets (5 mg total) by mouth  daily.       10/24/2023    2:35 PM 06/02/2023    3:38 PM 12/09/2022    9:50 AM 10/17/2022    2:13 PM  GAD 7 : Generalized Anxiety Score  Nervous, Anxious, on Edge 1 3 1 1   Control/stop worrying 1 3 2  0  Worry too much - different things 1 3 2  0  Trouble relaxing 0 3 2 0  Restless 0 3 1 0  Easily annoyed or irritable 0 1 1 1   Afraid - awful might happen 0 0 0 0  Total GAD 7 Score 3 16 9 2   Anxiety Difficulty Not difficult at all Somewhat difficult Somewhat difficult Not difficult at all       10/24/2023    2:35 PM 06/02/2023    3:38 PM 03/11/2023    8:25 AM  Depression screen PHQ 2/9  Decreased Interest 2 1 1   Down, Depressed, Hopeless 0 1 1  PHQ - 2 Score 2 2 2   Altered sleeping 1 3 0  Tired, decreased energy 3 3 0  Change in appetite 0 0 0  Feeling bad or failure about yourself  0 0 0  Trouble concentrating 2 0 0  Moving slowly or fidgety/restless 0 0 0  Suicidal thoughts 0 0 0  PHQ-9 Score 8 8 2   Difficult doing work/chores Not difficult at all Somewhat difficult Not difficult at all    BP Readings from Last 3 Encounters:  10/24/23 110/64  10/23/23 107/75  10/16/23 (!) 131/58    Physical Exam Vitals and nursing note reviewed.  Constitutional:      General: She is not in acute distress.    Appearance: Normal appearance. She is well-developed.  HENT:     Head: Normocephalic and atraumatic.  Eyes:     Extraocular Movements: Extraocular movements intact.  Neck:     Vascular: No carotid bruit.  Cardiovascular:     Rate and Rhythm: Normal rate and regular rhythm.  Pulmonary:     Effort: Pulmonary effort is normal. No respiratory distress.     Breath sounds: No wheezing or rhonchi.  Musculoskeletal:      Cervical back: Normal range of motion.     Right lower leg: No edema.     Left lower leg: No edema.  Lymphadenopathy:     Cervical: No cervical adenopathy.  Skin:    General: Skin is warm and dry.     Findings: No rash.  Neurological:     General: No focal deficit present.     Mental Status: She is alert and oriented to person, place, and time.  Psychiatric:        Mood and Affect: Mood normal.        Behavior: Behavior normal.     Wt Readings from Last 3 Encounters:  10/24/23 166 lb 6 oz (75.5 kg)  10/24/23 166 lb 6.4 oz (75.5 kg)  10/08/23 166 lb 6.4 oz (75.5 kg)    BP 110/64   Pulse 60   Ht 5\' 3"  (1.6 m)   Wt 166 lb 6 oz (75.5 kg)   SpO2 96%   BMI 29.47 kg/m   Assessment and Plan:  Problem List Items Addressed This Visit       Unprioritized   Multiple myeloma in remission (HCC) (Chronic)   Carotid artery stenosis, asymptomatic, bilateral (Chronic)   Resume statin Evaluate vision changes with referral      Relevant Medications   rosuvastatin (CRESTOR) 5 MG tablet  Benign essential HTN (Chronic)   Blood pressure is well controlled.  Current medications bisoprolol  and Coreg, Entresto . Will continue same regimen along with efforts to limit dietary sodium.       Relevant Medications   rosuvastatin (CRESTOR) 5 MG tablet   Coronary artery disease involving native coronary artery of native heart (Chronic)   She has not been taking Lipitor due to side effects Given her CAD and extensive disease, I suspect that she needs to be on statins Will start Crestor and recheck labs in 3 months.      Relevant Medications   rosuvastatin (CRESTOR) 5 MG tablet   Nonrheumatic aortic valve stenosis   TAVR planned for the near future. ECHO 09/2023 1.Dilated cardiomyopathy with LVEF = 27%. No evidence of infarct or scar. 2.Severe aortic stenosis with valve opening area of 0.5 sq cm and mild to moderate aortic valve regurgitation.       Relevant Medications   rosuvastatin  (CRESTOR) 5 MG tablet   Amaurosis fugax of left eye - Primary   Evidence of diffuse atherosclerotic disease on recent imaging Needs to resume statin Will refer to Neuro-ophthalmology      Relevant Medications   rosuvastatin (CRESTOR) 5 MG tablet   Other Relevant Orders   Ambulatory referral to Ophthalmology   Double vision with both eyes open   Relevant Orders   Ambulatory referral to Ophthalmology    Return in about 3 months (around 01/23/2024) for lipids.    Sheron Dixons, MD [facility] [place]

## 2023-10-24 NOTE — Assessment & Plan Note (Signed)
 Evidence of diffuse atherosclerotic disease on recent imaging Needs to resume statin Will refer to Neuro-ophthalmology

## 2023-10-24 NOTE — Assessment & Plan Note (Signed)
 She has not been taking Lipitor due to side effects Given her CAD and extensive disease, I suspect that she needs to be on statins Will start Crestor and recheck labs in 3 months.

## 2023-10-24 NOTE — Assessment & Plan Note (Addendum)
 TAVR planned for the near future. ECHO 09/2023 1.Dilated cardiomyopathy with LVEF = 27%. No evidence of infarct or scar. 2.Severe aortic stenosis with valve opening area of 0.5 sq cm and mild to moderate aortic valve regurgitation.

## 2023-10-24 NOTE — Progress Notes (Signed)
  Subjective:  Patient ID: Sarah [name], female    DOB: 07-01-1956,  MRN: [id number]  68 y.o. female presents painful, elongated thickened toenails x 10 which are symptomatic when wearing enclosed shoe gear. This interferes with his/her daily activities. Chief Complaint  Patient presents with   Nail Problem    Pt is here for [facility] last A1C was 6.1 PCP is Dr Gala Jubilee and LOV was last year.   New problem(s): None   PCP is Sheron Dixons, MD , and last visit was June 02, 2023.  Allergies  Allergen Reactions   Oxycodone-Acetaminophen  Anaphylaxis    Swelling and rash  acetaminophen  / oxycodone   Celebrex  [Celecoxib ] Diarrhea   Codeine     Plerixafor     In 2016 during ASCT collection patient developed fever to 103.57F and required hospitalization   Benadryl  [Diphenhydramine ] Palpitations   Morphine  Itching and Rash   Ondansetron  Diarrhea   Tylenol  [Acetaminophen ] Itching and Rash    Review of Systems: Negative except as noted in the HPI.   Objective:  Sarah [name] is a pleasant 68 y.o. female WD, WN in NAD. AAO x 3.  Vascular Examination: Vascular status intact b/l with palpable pedal pulses. CFT immediate b/l. Pedal hair present. No edema. No pain with calf compression b/l. Skin temperature gradient WNL b/l. No varicosities noted. No cyanosis or clubbing noted.  Neurological Examination: Sensation grossly intact b/l with 10 gram monofilament. Vibratory sensation intact b/l.  Dermatological Examination: Pedal skin with normal turgor, texture and tone b/l. No open wounds nor interdigital macerations noted. Toenails 1-5 b/l thick, discolored, elongated with subungual debris and pain on dorsal palpation. No hyperkeratotic lesions noted b/l.   Musculoskeletal Examination: Muscle strength 5/5 to b/l LE.  No pain, crepitus noted b/l. No gross pedal deformities. Patient ambulates independently without assistive aids.   Radiographs: None  Last A1c:      Latest Ref Rng &  Units 06/02/2023    4:58 PM 12/11/2022    8:52 AM  Hemoglobin A1C  Hemoglobin-A1c 4.0 - 5.6 % 6.2  6.5      Assessment:   1. Pain due to onychomycosis of toenails of both feet    Plan:  Patient was evaluated and treated. All patient's and/or POA's questions/concerns addressed on today's visit. Toenails 1-5 debrided in length and girth without incident. Continue soft, supportive shoe gear daily. Report any pedal injuries to medical professional. Call office if there are any questions/concerns. -Patient/POA to call should there be question/concern in the interim.  Return in about 3 months (around 01/23/2024).  Sarah [name], DPM      [place] LOCATION: [address] [place] [address]                   Office [phone]   [facility] LOCATION: [address] [place], [place] [address] Office [phone]

## 2023-10-24 NOTE — Assessment & Plan Note (Signed)
 Resume statin Evaluate vision changes with referral

## 2023-10-29 ENCOUNTER — Other Ambulatory Visit: Payer: Self-pay

## 2023-10-29 ENCOUNTER — Inpatient Hospital Stay

## 2023-10-29 VITALS — BP 139/89 | HR 69 | Temp 96.7°F | Resp 18

## 2023-10-29 DIAGNOSIS — C9001 Multiple myeloma in remission: Secondary | ICD-10-CM | POA: Diagnosis not present

## 2023-10-29 LAB — MAGNESIUM: Magnesium: 1.4 mg/dL — ABNORMAL LOW (ref 1.7–2.4)

## 2023-10-29 MED ORDER — SODIUM CHLORIDE 0.9 % IV SOLN
12.5000 mg | Freq: Once | INTRAVENOUS | Status: AC
  Administered 2023-10-29: 12.5 mg via INTRAVENOUS
  Filled 2023-10-29: qty 0.5

## 2023-10-29 MED ORDER — ALTEPLASE 2 MG IJ SOLR
2.0000 mg | Freq: Once | INTRAMUSCULAR | Status: DC | PRN
  Filled 2023-10-29: qty 2

## 2023-10-29 MED ORDER — HEPARIN SOD (PORK) LOCK FLUSH 100 UNIT/ML IV SOLN
500.0000 [IU] | Freq: Once | INTRAVENOUS | Status: AC | PRN
  Administered 2023-10-29: 500 [IU]
  Filled 2023-10-29: qty 5

## 2023-10-29 MED ORDER — MAGNESIUM SULFATE 4 GM/100ML IV SOLN
4.0000 g | Freq: Once | INTRAVENOUS | Status: AC
  Administered 2023-10-29: 4 g via INTRAVENOUS
  Filled 2023-10-29: qty 100

## 2023-10-29 MED ORDER — SODIUM CHLORIDE 0.9% FLUSH
10.0000 mL | Freq: Once | INTRAVENOUS | Status: DC | PRN
Start: 2023-10-29 — End: 2023-10-29
  Filled 2023-10-29: qty 10

## 2023-10-29 MED ORDER — SODIUM CHLORIDE 0.9 % IV SOLN
INTRAVENOUS | Status: DC
Start: 2023-10-29 — End: 2023-10-29
  Filled 2023-10-29: qty 250

## 2023-10-30 ENCOUNTER — Ambulatory Visit

## 2023-10-30 ENCOUNTER — Other Ambulatory Visit

## 2023-11-05 ENCOUNTER — Inpatient Hospital Stay: Attending: Oncology

## 2023-11-05 ENCOUNTER — Other Ambulatory Visit: Payer: Self-pay

## 2023-11-05 ENCOUNTER — Inpatient Hospital Stay

## 2023-11-05 VITALS — BP 124/61 | HR 60 | Temp 96.7°F | Resp 18

## 2023-11-05 DIAGNOSIS — Z9049 Acquired absence of other specified parts of digestive tract: Secondary | ICD-10-CM | POA: Insufficient documentation

## 2023-11-05 DIAGNOSIS — Z9221 Personal history of antineoplastic chemotherapy: Secondary | ICD-10-CM | POA: Diagnosis not present

## 2023-11-05 DIAGNOSIS — Z808 Family history of malignant neoplasm of other organs or systems: Secondary | ICD-10-CM | POA: Insufficient documentation

## 2023-11-05 DIAGNOSIS — T451X5A Adverse effect of antineoplastic and immunosuppressive drugs, initial encounter: Secondary | ICD-10-CM | POA: Insufficient documentation

## 2023-11-05 DIAGNOSIS — R197 Diarrhea, unspecified: Secondary | ICD-10-CM | POA: Insufficient documentation

## 2023-11-05 DIAGNOSIS — M549 Dorsalgia, unspecified: Secondary | ICD-10-CM | POA: Insufficient documentation

## 2023-11-05 DIAGNOSIS — E538 Deficiency of other specified B group vitamins: Secondary | ICD-10-CM | POA: Diagnosis not present

## 2023-11-05 DIAGNOSIS — Z87891 Personal history of nicotine dependence: Secondary | ICD-10-CM | POA: Diagnosis not present

## 2023-11-05 DIAGNOSIS — E1122 Type 2 diabetes mellitus with diabetic chronic kidney disease: Secondary | ICD-10-CM | POA: Insufficient documentation

## 2023-11-05 DIAGNOSIS — F32A Depression, unspecified: Secondary | ICD-10-CM | POA: Insufficient documentation

## 2023-11-05 DIAGNOSIS — D500 Iron deficiency anemia secondary to blood loss (chronic): Secondary | ICD-10-CM | POA: Diagnosis not present

## 2023-11-05 DIAGNOSIS — C9001 Multiple myeloma in remission: Secondary | ICD-10-CM

## 2023-11-05 DIAGNOSIS — R202 Paresthesia of skin: Secondary | ICD-10-CM | POA: Diagnosis not present

## 2023-11-05 DIAGNOSIS — G894 Chronic pain syndrome: Secondary | ICD-10-CM | POA: Diagnosis not present

## 2023-11-05 DIAGNOSIS — Z8249 Family history of ischemic heart disease and other diseases of the circulatory system: Secondary | ICD-10-CM | POA: Insufficient documentation

## 2023-11-05 DIAGNOSIS — G620 Drug-induced polyneuropathy: Secondary | ICD-10-CM | POA: Diagnosis not present

## 2023-11-05 DIAGNOSIS — Z885 Allergy status to narcotic agent status: Secondary | ICD-10-CM | POA: Diagnosis not present

## 2023-11-05 DIAGNOSIS — M2550 Pain in unspecified joint: Secondary | ICD-10-CM | POA: Insufficient documentation

## 2023-11-05 DIAGNOSIS — I130 Hypertensive heart and chronic kidney disease with heart failure and stage 1 through stage 4 chronic kidney disease, or unspecified chronic kidney disease: Secondary | ICD-10-CM | POA: Insufficient documentation

## 2023-11-05 DIAGNOSIS — Z90710 Acquired absence of both cervix and uterus: Secondary | ICD-10-CM | POA: Insufficient documentation

## 2023-11-05 DIAGNOSIS — Z79899 Other long term (current) drug therapy: Secondary | ICD-10-CM | POA: Insufficient documentation

## 2023-11-05 DIAGNOSIS — Z886 Allergy status to analgesic agent status: Secondary | ICD-10-CM | POA: Insufficient documentation

## 2023-11-05 DIAGNOSIS — I5032 Chronic diastolic (congestive) heart failure: Secondary | ICD-10-CM | POA: Insufficient documentation

## 2023-11-05 DIAGNOSIS — Z801 Family history of malignant neoplasm of trachea, bronchus and lung: Secondary | ICD-10-CM | POA: Insufficient documentation

## 2023-11-05 DIAGNOSIS — Z832 Family history of diseases of the blood and blood-forming organs and certain disorders involving the immune mechanism: Secondary | ICD-10-CM | POA: Insufficient documentation

## 2023-11-05 DIAGNOSIS — N184 Chronic kidney disease, stage 4 (severe): Secondary | ICD-10-CM | POA: Insufficient documentation

## 2023-11-05 DIAGNOSIS — Z822 Family history of deafness and hearing loss: Secondary | ICD-10-CM | POA: Insufficient documentation

## 2023-11-05 DIAGNOSIS — I252 Old myocardial infarction: Secondary | ICD-10-CM | POA: Diagnosis not present

## 2023-11-05 DIAGNOSIS — Z800 Family history of malignant neoplasm of digestive organs: Secondary | ICD-10-CM | POA: Insufficient documentation

## 2023-11-05 DIAGNOSIS — Z803 Family history of malignant neoplasm of breast: Secondary | ICD-10-CM | POA: Insufficient documentation

## 2023-11-05 DIAGNOSIS — Z8379 Family history of other diseases of the digestive system: Secondary | ICD-10-CM | POA: Insufficient documentation

## 2023-11-05 DIAGNOSIS — Z888 Allergy status to other drugs, medicaments and biological substances status: Secondary | ICD-10-CM | POA: Insufficient documentation

## 2023-11-05 DIAGNOSIS — Z833 Family history of diabetes mellitus: Secondary | ICD-10-CM | POA: Insufficient documentation

## 2023-11-05 DIAGNOSIS — Z841 Family history of disorders of kidney and ureter: Secondary | ICD-10-CM | POA: Insufficient documentation

## 2023-11-05 LAB — MAGNESIUM: Magnesium: 1.3 mg/dL — ABNORMAL LOW (ref 1.7–2.4)

## 2023-11-05 MED ORDER — SODIUM CHLORIDE 0.9 % IV SOLN
12.5000 mg | Freq: Once | INTRAVENOUS | Status: AC
  Administered 2023-11-05: 12.5 mg via INTRAVENOUS
  Filled 2023-11-05: qty 0.5

## 2023-11-05 MED ORDER — HEPARIN SOD (PORK) LOCK FLUSH 100 UNIT/ML IV SOLN
500.0000 [IU] | Freq: Once | INTRAVENOUS | Status: AC | PRN
  Administered 2023-11-05: 500 [IU]
  Filled 2023-11-05: qty 5

## 2023-11-05 MED ORDER — SODIUM CHLORIDE 0.9% FLUSH
3.0000 mL | Freq: Once | INTRAVENOUS | Status: DC | PRN
Start: 2023-11-05 — End: 2023-11-05
  Filled 2023-11-05: qty 3

## 2023-11-05 MED ORDER — MAGNESIUM SULFATE 4 GM/100ML IV SOLN
4.0000 g | Freq: Once | INTRAVENOUS | Status: AC
  Administered 2023-11-05: 4 g via INTRAVENOUS
  Filled 2023-11-05: qty 100

## 2023-11-05 MED ORDER — MAGNESIUM SULFATE 2 GM/50ML IV SOLN
2.0000 g | Freq: Once | INTRAVENOUS | Status: AC
  Administered 2023-11-05: 2 g via INTRAVENOUS
  Filled 2023-11-05: qty 50

## 2023-11-05 MED ORDER — SODIUM CHLORIDE 0.9 % IV SOLN
INTRAVENOUS | Status: DC
  Filled 2023-11-05: qty 250

## 2023-11-06 ENCOUNTER — Other Ambulatory Visit

## 2023-11-06 ENCOUNTER — Ambulatory Visit

## 2023-11-06 HISTORY — PX: TRANSCATHETER AORTIC VALVE REPLACEMENT, TRANSFEMORAL: SHX6400

## 2023-11-10 ENCOUNTER — Telehealth: Payer: Self-pay | Admitting: *Deleted

## 2023-11-10 NOTE — Transitions of Care (Post Inpatient/ED Visit) (Signed)
 11/10/2023  Name: Sarah [name] MRN: [id number] DOB: [birth year]  Today's TOC FU Call Status: Today's TOC FU Call Status:: Successful TOC FU Call Completed TOC FU Call Complete Date: 11/10/23 Patient's Name and Date of Birth confirmed.  Transition Care Management Follow-up Telephone Call Date of Discharge: 11/07/23 Discharge Facility: Other ([facility]) Name of Other ([facility]) Discharge Facility: [facility] Type of Discharge: Inpatient Admission Primary Inpatient Discharge Diagnosis:: planned surgical TAVR How have you been since you were released from the hospital?: Better ("I am doing fine; it was planned surgery; my sister is here if I need anything but I am pretty much back to normal, doing everything on my own.  Much less shortness of breath after the AV replacement.  Doing fine, no concerns at all.") Any questions or concerns?: No  Items Reviewed: Did you receive and understand the discharge instructions provided?: Yes (briefly reviewed with patient who verbalizes good understanding of same - outside hospital AVS) Medications obtained,verified, and reconciled?: Yes (Medications Reviewed) (Full medication reconciliation/ review completed; no concerns or discrepancies identified; confirmed patient obtained/ is taking all newly Rx'd medications as instructed; self-manages medications and denies questions/ concerns around medications today) Any new allergies since your discharge?: No Dietary orders reviewed?: Yes Type of Diet Ordered:: "Heart healthy, low salt, low sugar and carbs" Do you have support at home?: Yes People in Home [RPT]: sibling(s) Name of Support/Comfort Primary Source: Reports independent in self-care activities; resides with supportive sister assists as/ if needed/ indicated  Medications Reviewed Today: Medications Reviewed Today     Reviewed by Fraidy Mccarrick M, RN (Registered Nurse) on 11/10/23 at 1333  Med List Status: <None>   Medication Order  Taking? Sig Documenting Provider Last Dose Status Informant  allopurinol  (ZYLOPRIM ) 100 MG tablet [id number] Yes TAKE 1 TABLET(100 MG) BY MOUTH DAILY as needed Sheron Dixons, MD Taking Active Self  amiodarone (PACERONE) 200 MG tablet [id number] Yes Take 200 mg by mouth daily. [provider] Taking Active   bisoprolol  (ZEBETA ) 10 MG tablet [id number] Yes Take 1 tablet (10 mg total) by mouth daily. Sheron Dixons, MD Taking Active   calcium  carbonate (OS-CAL) 1250 (500 Ca) MG chewable tablet [id number] No Chew 2 tablets by mouth daily.  Patient not taking: Reported on 11/10/2023   [provider] Not Taking Active Self  celecoxib  (CELEBREX ) 200 MG capsule [id number] No Take 1 capsule by mouth 2 (two) times daily.  Patient not taking: Reported on 11/10/2023   [provider] Not Taking Active   cholestyramine  (QUESTRAN ) 4 GM/DOSE powder [id number] Yes Take 1 packet (4 g total) by mouth 2 (two) times daily with a meal. Luke Salaam, MD Taking Active   diazepam  (VALIUM ) 5 MG tablet [id number] Yes Take 0.5-1 tablets (2.5-5 mg total) by mouth at bedtime as needed (insomnia). Sheron Dixons, MD Taking Active   diclofenac  sodium (VOLTAREN ) 1 % GEL [id number] Yes Apply 2 g topically 4 (four) times daily. Providence Brow, NP Taking Active Self           Med Note Colton Dearth Apr 08, 2022  1:53 PM)    diphenoxylate -atropine  (LOMOTIL ) 2.5-0.025 MG tablet [id number] Yes Take 2 tablets by mouth 4 (four) times daily as needed for diarrhea or loose stools. Timmy Forbes, MD Taking Active   ENTRESTO  24-26 MG [id number] Yes Take 1 tablet by mouth 2 (two) times daily. [provider] Taking Active   FLUoxetine  (PROZAC ) 40 MG capsule [id number] Yes Take 1  capsule (40 mg total) by mouth daily. Sheron Dixons, MD Taking Active   furosemide  (LASIX ) 40 MG tablet [id number] Yes Take 0.5 tablets (20 mg total) by mouth daily. Home dose. Garrison Kanner, MD Taking Active            Med  Note (Baneen Wieseler M   Mon Nov 10, 2023  1:29 PM) 11/07/23: Reports during [facility] call, currently taking 40 mg po every day post- recent TAVR at [facility]   glipiZIDE  (GLUCOTROL  XL) 2.5 MG 24 hr tablet [id number] Yes Take 1 tablet (2.5 mg total) by mouth daily with breakfast. Sheron Dixons, MD Taking Active   glucose blood test strip [id number] Yes  [provider] Taking Active Self  JARDIANCE 10 MG TABS tablet [id number] Yes Take 10 mg by mouth daily. [provider] Taking Active   lidocaine  (XYLOCAINE ) 2 % solution [id number] Yes Use as directed 15 mLs in the mouth or throat every 3 (three) hours as needed for mouth pain (swish and spit). Floydene Hy, PA-C Taking Active Self  losartan (COZAAR) 25 MG tablet [id number] No Take 1 tablet by mouth daily.  Patient not taking: Reported on 11/10/2023   [provider] Not Taking Active   magnesium  chloride (SLOW-MAG) 64 MG TBEC SR tablet [id number] No Take 1 tablet (64 mg total) by mouth daily.  Patient not taking: Reported on 11/10/2023   Borders, Carlene Che, NP Not Taking Active Self  Multiple Vitamin (MULTIVITAMIN) tablet [id number] Yes Take 1 tablet by mouth daily. [provider] Taking Active Pharmacy Records, Self  omeprazole  (PRILOSEC) 40 MG capsule [id number] Yes TAKE 1 CAPSULE IN THE MORNING AND TAKE 1 CAPSULE AT BEDTIME Sheron Dixons, MD Taking Active   pentoxifylline  (TRENTAL ) 400 MG CR tablet [id number] No Take 400 mg by mouth 3 (three) times daily with meals.  Patient not taking: Reported on 11/10/2023   [provider] Not Taking Active   potassium chloride  SA (KLOR-CON  M) 20 MEQ tablet [id number] Yes Take 1 tablet (20 mEq total) by mouth daily. Timmy Forbes, MD Taking Active            Med Note Langston Pippins, Deanna Expose   Tue Mar 11, 2023  8:22 AM) Takes PRN  promethazine  (PHENERGAN ) 25 MG tablet [id number] Yes Take 1 tablet (25 mg total) by mouth every 6 (six) hours as needed for nausea or vomiting. Timmy Forbes, MD  Taking Active   rosuvastatin  (CRESTOR ) 5 MG tablet [id number] Yes Take 1 tablet (5 mg total) by mouth daily. Sheron Dixons, MD Taking Active   spironolactone (ALDACTONE) 25 MG tablet [id number] Yes Take by mouth. [provider] Taking Active   tiZANidine  (ZANAFLEX ) 4 MG tablet [id number] Yes Take 1 tablet (4 mg total) by mouth every 6 (six) hours as needed for muscle spasms. Sheron Dixons, MD Taking Active   traMADol  (ULTRAM ) 50 MG tablet [id number] Yes  [provider] Taking Active   traZODone  (DESYREL ) 100 MG tablet [id number] Yes TAKE 1 TABLET AT BEDTIME Sheron Dixons, MD Taking Active Self  vitamin E 45 MG (100 UNITS) capsule [id number] No Take by mouth daily.  Patient not taking: Reported on 11/10/2023   [provider] Not Taking Active Self  Med List Note Thaddeus Filippo, RPH-CPP 03/10/20 1009): Pomalyst  filled at [facility] and Equipment/Supplies: Were Home Health Services Ordered?: No Any new equipment or medical supplies ordered?: No  Functional Questionnaire: Do you need assistance with bathing/showering or dressing?: No Do you need assistance with meal preparation?: No Do you need assistance with eating?: No Do you have difficulty maintaining continence: No Do you need assistance with getting out of bed/getting out of a chair/moving?: No Do you have difficulty managing or taking your medications?: No  Follow up appointments reviewed: PCP Follow-up appointment confirmed?: Yes Date of PCP follow-up appointment?: 11/14/23 Follow-up Provider: PCP Specialist Hospital Follow-up appointment confirmed?: Yes Date of Specialist follow-up appointment?: 12/11/23 (verified this is recommended time frame for follow up per hospital discharging provider notes) Follow-Up Specialty Provider:: Surgical provider at [facility] Do you need transportation to your follow-up appointment?: No Do you understand care options if your  condition(s) worsen?: Yes-patient verbalized understanding  SDOH Interventions Today    Flowsheet Row Most Recent Value  SDOH Interventions   Food Insecurity Interventions Intervention Not Indicated  Housing Interventions Intervention Not Indicated  Transportation Interventions Intervention Not Indicated  [drives self at baseline,  sister assists as- if indicated]  Utilities Interventions Intervention Not Indicated      Patient declines need for ongoing/ further care management/ coordination outreach; declines enrollment in 30-day TOC program- declines taking my direct phone number should needs/ concerns arise post-TOC call   See TOC assessment tabs for additional assessment/ TOC intervention information  Pls call/ message for questions,  Analynn Daum Mckinney Davanta Meuser, RN, BSN, Media planner  Transitions of Care  [facility] Population [facility]  [place] [phone]: direct office

## 2023-11-12 ENCOUNTER — Inpatient Hospital Stay

## 2023-11-12 VITALS — BP 126/68 | HR 63 | Temp 96.0°F | Resp 18

## 2023-11-12 DIAGNOSIS — C9001 Multiple myeloma in remission: Secondary | ICD-10-CM

## 2023-11-12 LAB — MAGNESIUM: Magnesium: 2.1 mg/dL (ref 1.7–2.4)

## 2023-11-12 MED ORDER — HEPARIN SOD (PORK) LOCK FLUSH 100 UNIT/ML IV SOLN
500.0000 [IU] | Freq: Once | INTRAVENOUS | Status: AC | PRN
  Administered 2023-11-12: 500 [IU]
  Filled 2023-11-12: qty 5

## 2023-11-12 MED ORDER — SODIUM CHLORIDE 0.9 % IV SOLN
INTRAVENOUS | Status: DC
  Filled 2023-11-12: qty 250

## 2023-11-12 MED ORDER — SODIUM CHLORIDE 0.9% FLUSH
10.0000 mL | Freq: Once | INTRAVENOUS | Status: DC | PRN
Start: 2023-11-12 — End: 2023-11-12
  Filled 2023-11-12: qty 10

## 2023-11-12 MED ORDER — SODIUM CHLORIDE 0.9 % IV SOLN
12.5000 mg | Freq: Once | INTRAVENOUS | Status: AC
  Administered 2023-11-12: 12.5 mg via INTRAVENOUS
  Filled 2023-11-12: qty 0.5

## 2023-11-12 NOTE — Progress Notes (Signed)
 Magnesium  WDL 2.1 no Magnesium  needed today. Pt. Feeling nauseated gave phenagran.

## 2023-11-13 ENCOUNTER — Other Ambulatory Visit

## 2023-11-13 ENCOUNTER — Ambulatory Visit

## 2023-11-14 ENCOUNTER — Encounter: Payer: Self-pay | Admitting: Oncology

## 2023-11-14 ENCOUNTER — Other Ambulatory Visit (HOSPITAL_COMMUNITY)
Admission: RE | Admit: 2023-11-14 | Discharge: 2023-11-14 | Disposition: A | Discharge status: Home / Self Care | Source: Ambulatory Visit | Attending: Internal Medicine | Admitting: Internal Medicine

## 2023-11-14 ENCOUNTER — Encounter: Payer: Self-pay | Admitting: Internal Medicine

## 2023-11-14 ENCOUNTER — Ambulatory Visit: Admitting: Internal Medicine

## 2023-11-14 VITALS — BP 112/74 | HR 73 | Ht 63.0 in | Wt 166.0 lb

## 2023-11-14 DIAGNOSIS — F324 Major depressive disorder, single episode, in partial remission: Secondary | ICD-10-CM | POA: Diagnosis not present

## 2023-11-14 DIAGNOSIS — Z7984 Long term (current) use of oral hypoglycemic drugs: Secondary | ICD-10-CM

## 2023-11-14 DIAGNOSIS — M47816 Spondylosis without myelopathy or radiculopathy, lumbar region: Secondary | ICD-10-CM

## 2023-11-14 DIAGNOSIS — I5022 Chronic systolic (congestive) heart failure: Secondary | ICD-10-CM | POA: Diagnosis not present

## 2023-11-14 DIAGNOSIS — E1169 Type 2 diabetes mellitus with other specified complication: Secondary | ICD-10-CM

## 2023-11-14 DIAGNOSIS — N760 Acute vaginitis: Secondary | ICD-10-CM | POA: Insufficient documentation

## 2023-11-14 DIAGNOSIS — Z952 Presence of prosthetic heart valve: Secondary | ICD-10-CM

## 2023-11-14 DIAGNOSIS — E785 Hyperlipidemia, unspecified: Secondary | ICD-10-CM

## 2023-11-14 MED ORDER — FLUOXETINE HCL 40 MG PO CAPS
40.0000 mg | ORAL_CAPSULE | Freq: Every day | ORAL | 1 refills | Status: AC
Start: 2023-11-14 — End: ?

## 2023-11-14 MED ORDER — FLUCONAZOLE 100 MG PO TABS: 100.0000 mg | ORAL_TABLET | Freq: Every day | ORAL | 0 refills | Status: AC

## 2023-11-14 MED ORDER — METAXALONE 800 MG PO TABS: 800.0000 mg | ORAL_TABLET | Freq: Every morning | ORAL | 0 refills | Status: AC

## 2023-11-14 MED ORDER — TIZANIDINE HCL 4 MG PO TABS: 4.0000 mg | ORAL_TABLET | Freq: Every day | ORAL | 0 refills | Status: AC

## 2023-11-14 NOTE — Assessment & Plan Note (Signed)
 Clinically stable on Prozac .   No SI or HI on evaluation. Plan to continue same medications for now.

## 2023-11-14 NOTE — Progress Notes (Signed)
 Date:  11/14/2023   Name:  Sarah [name]   DOB:  05-16-1956   MRN:  [id number]   Chief Complaint: Hospitalization Follow-up, Vaginitis (Patient said she thinks she has a yeast infection due to antibiotics give during surgery ), and Medication Refill (Prozac  40 mg) Admitted to [facility] 11/06/23 to 11/07/23 for TAVR. TOC call done on 11/10/23.  Hospital Course:  The patient underwent a transcatheter aortic valve replacement as noted above on 11/06/2023 and tolerated the procedure well. Pre Av gradient , post with LVEDP . The patient was transferred to the floor once stable.  Post op notable for some constipation, but this is chronic for her. In addition, she was NPO yesterday and taking opioids. Will give dose of miralax  and instructed to reach out to PCP if she does not have a BM sometime today. BMP and CBC stable and groin sites remained c/d/I without swelling, tenderness or other signs of bleeding. Echo stable without signs of mild paravalvular leak. ECG and telemetry without signs of AV block or other acute abnormality. Creatinine slightly elevated but this is baseline.  Prior to discharge, the patient was able to void spontaneously, ambulate, tolerate a diet, and have pain controlled with oral pain medication. The patient was dischaged in stable condition.   No notes on file  Outpatient Follow Up Issues:  Follow Up Appointment and ECHO in 1 month   CONTINUE taking these medications   acyclovir  200 MG capsule Commonly known as: ZOVIRAX  Take 1 capsule (200 mg total) by mouth Two (2) times a day.  albuterol  90 mcg/actuation inhaler Commonly known as: PROVENTIL  HFA;VENTOLIN  HFA Inhale 2 puffs every six (6) hours as needed for wheezing or shortness of breath.  amiodarone 200 MG tablet Commonly known as: PACERONE Take 1 tablet (200 mg total) by mouth daily.  bisoprolol  10 MG tablet Commonly known as: ZEBETA  Take 1 tablet (10 mg total) by mouth daily.  calcium  carbonate  500 mg calcium  (1,250 mg) chewable tablet Commonly known as: OS-CAL Chew 1 tablet (500 mg elem calcium  total).  cholestyramine  4 gram packet Commonly known as: QUESTRAN  USE ONE PACKET TWICE DAILY WITH A MEAL  diazePAM  5 MG tablet Commonly known as: VALIUM  Take 0.5-1 tablets (2.5-5 mg total) by mouth.  diphenoxylate -atropine  2.5-0.025 mg per tablet Commonly known as: LOMOTIL  Take 2 tablets by mouth.  ENTRESTO  24-26 mg tablet Generic drug: sacubitril -valsartan  Take 1 tablet by mouth two (2) times a day.  FLUoxetine  40 MG capsule Commonly known as: PROZAC  Take 1 capsule (40 mg total) by mouth daily.  fluticasone  propionate 50 mcg/actuation nasal spray Commonly known as: FLONASE   furosemide  40 MG tablet Commonly known as: LASIX  Take 1.5 tablets (60 mg total) by mouth daily.  glipiZIDE  2.5 MG 24 hr tablet Commonly known as: GLUCOTROL  XL Take 1 tablet (2.5 mg total) by mouth.  JARDIANCE 10 mg tablet Generic drug: empagliflozin Take 1 tablet (10 mg total) by mouth daily.  loperamide  2 mg capsule Commonly known as: IMODIUM  Take 1 capsule (2 mg total) by mouth four (4) times a day as needed for diarrhea.  methocarbamol  500 MG tablet Commonly known as: ROBAXIN  Take 1 tablet (500 mg total) by mouth four (4) times a day. PRN  montelukast  10 mg tablet Commonly known as: SINGULAIR  Take 1 tablet (10 mg total) by mouth every twelve (12) hours as needed. Takes 2 to 3 times weekly. Only takes with chemo txs have not have tx in 18 months  nitroglycerin  0.4 MG SL  tablet Commonly known as: NITROSTAT  Place 1 tablet (0.4 mg total) under the tongue every five (5) minutes as needed for chest pain. Maximum of 3 doses in 15 minutes.  omeprazole  40 MG capsule Commonly known as: PriLOSEC Take 1 capsule (40 mg total) by mouth Two (2) times a day.  potassium chloride  20 MEQ ER tablet Take 1 tablet (20 mEq total) by mouth. PRN  promethazine  25 MG tablet Commonly known as:  PHENERGAN  Take 1 tablet (25 mg total) by mouth Three (3) times a day as needed for nausea.  propylene glycol 0.6 % Drop Apply 1 drop to eye two (2) times a day as needed (dry eyes).  spironolactone 25 MG tablet Commonly known as: ALDACTONE TAKE 1/2 TABLET(12.5 MG) BY MOUTH DAILY  tizanidine  2 MG tablet Commonly known as: ZANAFLEX  Take 2 tablets (4 mg total) by mouth every eight (8) hours as needed.  traMADol  50 mg tablet Commonly known as: ULTRAM   traZODone  100 MG tablet Commonly known as: DESYREL  Take 1 tablet (100 mg total) by mouth nightly.  Vaginal Itching The patient's primary symptoms include vaginal discharge (and itching). This is a new problem. The current episode started in the past 7 days. The problem occurs constantly. The problem has been unchanged. Pertinent negatives include no abdominal pain, chills, fever or headaches.  Depression        This is a chronic problem.The problem is unchanged.  Associated symptoms include fatigue.  Associated symptoms include no headaches.  Past treatments include SSRIs - Selective serotonin reuptake inhibitors. Hyperlipidemia This is a chronic problem. Pertinent negatives include no chest pain or shortness of breath. Current antihyperlipidemic treatment includes statins (tolerating statin).    Review of Systems  Constitutional:  Positive for fatigue. Negative for chills and fever.  Respiratory:  Negative for cough, chest tightness, shortness of breath and wheezing.   Cardiovascular:  Negative for chest pain, palpitations and leg swelling.  Gastrointestinal:  Negative for abdominal pain.  Genitourinary:  Positive for vaginal discharge (and itching).  Musculoskeletal:  Negative for arthralgias and gait problem.  Neurological:  Negative for dizziness and headaches.  Psychiatric/Behavioral:  Positive for depression and dysphoric mood. Negative for sleep disturbance. The patient is nervous/anxious.      Lab Results  Component Value  Date   NA 137 10/08/2023   K 4.3 10/08/2023   CO2 22 10/08/2023   GLUCOSE 140 (H) 10/08/2023   BUN 28 (H) 10/08/2023   CREATININE 1.98 (H) 10/08/2023   CALCIUM  8.6 (L) 10/08/2023   GFRNONAA 27 (L) 10/08/2023   Lab Results  Component Value Date   CHOL 164 12/11/2022   HDL 67 12/11/2022   LDLCALC 83 12/11/2022   TRIG 68 12/11/2022   CHOLHDL 2.4 12/11/2022   Lab Results  Component Value Date   TSH 1.450 07/22/2022   Lab Results  Component Value Date   HGBA1C 6.2 (A) 06/02/2023   Lab Results  Component Value Date   WBC 6.8 10/08/2023   HGB 12.9 10/08/2023   HCT 38.7 10/08/2023   MCV 88.6 10/08/2023   PLT 134 (L) 10/08/2023   Lab Results  Component Value Date   ALT 49 (H) 10/08/2023   AST 42 (H) 10/08/2023   ALKPHOS 80 10/08/2023   BILITOT 0.4 10/08/2023   Lab Results  Component Value Date   VD25OH 25.64 (L) 12/11/2022     Patient Active Problem List   Diagnosis Date Noted   S/P aortic valve replacement 11/14/2023   Amaurosis fugax of left  eye 10/24/2023   Double vision with both eyes open 10/24/2023   Stage 4 chronic kidney disease (HCC) 10/08/2023   Jaw pain 03/13/2023   Prolonged QT interval 03/07/2023   Vitamin D  deficiency 12/09/2022   Diabetes mellitus treated with oral medication (HCC) 12/09/2022   NICM (nonischemic cardiomyopathy) (HCC) 08/05/2022   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   Overweight (BMI 25.0-29.9) 02/04/2022   Complete left bundle branch block 09/06/2021   Anemia of chronic renal failure, stage 3a (HCC) 09/06/2021   Gastroesophageal reflux disease without esophagitis 09/06/2021   Chronic systolic CHF (congestive heart failure) (HCC) 08/22/2021   Low immunoglobulin level    Multiple myeloma in remission (HCC) 05/14/2021   Neuropathy 04/02/2021   Frequent PVCs 12/04/2020   Coronary artery disease involving native coronary artery of native heart 04/26/2020   Hypokalemia 03/11/2020   Physical deconditioning 02/07/2020   Impaired nutrition  02/07/2020   S/P autologous bone marrow transplantation (HCC) 09/10/2019   Hypocalcemia 08/28/2019   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   Hyperlipidemia associated with type 2 diabetes mellitus (HCC) 11/25/2018   Hypomagnesemia 08/20/2018   Goals of care, counseling/discussion 08/20/2018   B12 deficiency 08/20/2018   Thrombocytopenia (HCC) 02/09/2018   Benign essential HTN 08/21/2017   Carotid artery stenosis, asymptomatic, bilateral 08/06/2017   Thyroid  nodule 08/06/2017   Iron  deficiency anemia due to chronic blood loss 05/13/2017   Spinal stenosis of lumbar region with neurogenic claudication 04/09/2017   Long term prescription opiate use 09/07/2016   Pain medication agreement signed 07/08/2016   Spondylosis of lumbar region without myelopathy or radiculopathy 07/08/2016   Nonrheumatic aortic valve stenosis 06/05/2016   Environmental and seasonal allergies 04/19/2016   Insomnia 04/19/2016   Asthma 04/19/2016   Aortic regurgitation 04/19/2016   Type II diabetes mellitus with complication (HCC) 04/12/2016   Depression, major, single episode, in partial remission (HCC) 04/12/2016   Irritable bowel syndrome (IBS) 04/12/2016   Hepatic steatosis 04/12/2016   Multiple myeloma in remission (HCC) 04/02/2016   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HCC) 07/06/2015   History of stem cell transplant (HCC) 05/16/2015    Allergies  Allergen Reactions   Oxycodone-Acetaminophen  Anaphylaxis    Swelling and rash  acetaminophen  / oxycodone   Celebrex  [Celecoxib ] Diarrhea   Codeine     Plerixafor     In 2016 during ASCT collection patient developed fever to 103.34F and required hospitalization   Benadryl  [Diphenhydramine ] Palpitations   Morphine  Itching and Rash   Ondansetron  Diarrhea   Tylenol  [Acetaminophen ] Itching and Rash    Past Surgical History:  Procedure Laterality Date   ABDOMINAL HYSTERECTOMY     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   CARDIAC ELECTROPHYSIOLOGY  MAPPING AND ABLATION     CARPAL TUNNEL RELEASE Bilateral    CHOLECYSTECTOMY  2008   COLONOSCOPY WITH PROPOFOL  N/A 05/07/2017   Procedure: COLONOSCOPY WITH PROPOFOL ;  Surgeon: Luke Salaam, MD;  Location: [facility] ENDOSCOPY;  Service: Gastroenterology;  Laterality: N/A;   COLONOSCOPY WITH PROPOFOL  N/A 12/19/2020   Procedure: COLONOSCOPY WITH PROPOFOL ;  Surgeon: Luke Salaam, MD;  Location: [facility] ENDOSCOPY;  Service: Gastroenterology;  Laterality: N/A;   ESOPHAGOGASTRODUODENOSCOPY (EGD) WITH PROPOFOL  N/A 05/07/2017   Procedure: ESOPHAGOGASTRODUODENOSCOPY (EGD) WITH PROPOFOL ;  Surgeon: Luke Salaam, MD;  Location: [facility] ENDOSCOPY;  Service: Gastroenterology;  Laterality: N/A;   FOOT SURGERY Bilateral    INCONTINENCE SURGERY  2009   INTERSTIM IMPLANT PLACEMENT     other     over active bladder   OTHER SURGICAL HISTORY  bladder stimulator    PARTIAL HYSTERECTOMY  03/1996   fibroids   PORTA CATH INSERTION N/A 03/10/2019   Procedure: PORTA CATH INSERTION;  Surgeon: Celso College, MD;  Location: [facility] INVASIVE CV LAB;  Service: Cardiovascular;  Laterality: N/A;   TONSILLECTOMY  2007   TRANSCATHETER AORTIC VALVE REPLACEMENT, TRANSFEMORAL  11/06/2023    Social History   Tobacco Use   Smoking status: Former    Current packs/day: 0.00    Average packs/day: 1 pack/day for 20.0 years (20.0 ttl pk-yrs)    Types: Cigarettes    Start date: 07/02/1971    Quit date: 07/02/1991    Years since quitting: 32.3   Smokeless tobacco: Never  Vaping Use   Vaping status: Never Used  Substance Use Topics   Alcohol use: Yes    Alcohol/week: 2.0 standard drinks of alcohol    Types: 2 Cans of beer per week   Drug use: No     Medication list has been reviewed and updated.  Current Meds  Medication Sig   allopurinol  (ZYLOPRIM ) 100 MG tablet TAKE 1 TABLET(100 MG) BY MOUTH DAILY as needed   amiodarone (PACERONE) 200 MG tablet Take 200 mg by mouth daily.   bisoprolol  (ZEBETA ) 10 MG tablet Take 1 tablet (10 mg  total) by mouth daily.   cholestyramine  (QUESTRAN ) 4 GM/DOSE powder Take 1 packet (4 g total) by mouth 2 (two) times daily with a meal.   diazepam  (VALIUM ) 5 MG tablet Take 0.5-1 tablets (2.5-5 mg total) by mouth at bedtime as needed (insomnia).   diclofenac  sodium (VOLTAREN ) 1 % GEL Apply 2 g topically 4 (four) times daily.   diphenoxylate -atropine  (LOMOTIL ) 2.5-0.025 MG tablet Take 2 tablets by mouth 4 (four) times daily as needed for diarrhea or loose stools.   ENTRESTO  24-26 MG Take 1 tablet by mouth 2 (two) times daily.   fluconazole  (DIFLUCAN ) 100 MG tablet Take 1 tablet (100 mg total) by mouth daily for 3 days.   furosemide  (LASIX ) 40 MG tablet Take 0.5 tablets (20 mg total) by mouth daily. Home dose.   glipiZIDE  (GLUCOTROL  XL) 2.5 MG 24 hr tablet Take 1 tablet (2.5 mg total) by mouth daily with breakfast.   glucose blood test strip    JARDIANCE 10 MG TABS tablet Take 10 mg by mouth daily.   lidocaine  (XYLOCAINE ) 2 % solution Use as directed 15 mLs in the mouth or throat every 3 (three) hours as needed for mouth pain (swish and spit).   metaxalone (SKELAXIN) 800 MG tablet Take 1 tablet (800 mg total) by mouth in the morning.   Multiple Vitamin (MULTIVITAMIN) tablet Take 1 tablet by mouth daily.   omeprazole  (PRILOSEC) 40 MG capsule TAKE 1 CAPSULE IN THE MORNING AND TAKE 1 CAPSULE AT BEDTIME   potassium chloride  SA (KLOR-CON  M) 20 MEQ tablet Take 1 tablet (20 mEq total) by mouth daily.   promethazine  (PHENERGAN ) 25 MG tablet Take 1 tablet (25 mg total) by mouth every 6 (six) hours as needed for nausea or vomiting.   rosuvastatin  (CRESTOR ) 5 MG tablet Take 1 tablet (5 mg total) by mouth daily.   spironolactone (ALDACTONE) 25 MG tablet Take by mouth.   traMADol  (ULTRAM ) 50 MG tablet    traZODone  (DESYREL ) 100 MG tablet TAKE 1 TABLET AT BEDTIME   [DISCONTINUED] FLUoxetine  (PROZAC ) 40 MG capsule Take 1 capsule (40 mg total) by mouth daily.   [DISCONTINUED] tiZANidine  (ZANAFLEX ) 4 MG tablet  Take 1 tablet (4 mg total) by mouth every  6 (six) hours as needed for muscle spasms.       11/14/2023    1:17 PM 10/24/2023    2:35 PM 06/02/2023    3:38 PM 12/09/2022    9:50 AM  GAD 7 : Generalized Anxiety Score  Nervous, Anxious, on Edge 2 1 3 1   Control/stop worrying 1 1 3 2   Worry too much - different things 1 1 3 2   Trouble relaxing 1 0 3 2  Restless 0 0 3 1  Easily annoyed or irritable 0 0 1 1  Afraid - awful might happen 0 0 0 0  Total GAD 7 Score 5 3 16 9   Anxiety Difficulty Not difficult at all Not difficult at all Somewhat difficult Somewhat difficult       11/14/2023    1:17 PM 10/24/2023    2:35 PM 06/02/2023    3:38 PM  Depression screen PHQ 2/9  Decreased Interest 0 2 1  Down, Depressed, Hopeless 0 0 1  PHQ - 2 Score 0 2 2  Altered sleeping 1 1 3   Tired, decreased energy 3 3 3   Change in appetite 1 0 0  Feeling bad or failure about yourself  0 0 0  Trouble concentrating 2 2 0  Moving slowly or fidgety/restless 3 0 0  Suicidal thoughts 0 0 0  PHQ-9 Score 10 8 8   Difficult doing work/chores Somewhat difficult Not difficult at all Somewhat difficult    BP Readings from Last 3 Encounters:  11/14/23 112/74  11/12/23 126/68  11/05/23 124/61    Physical Exam Vitals and nursing note reviewed.  Constitutional:      General: She is not in acute distress.    Appearance: Normal appearance. She is well-developed.  HENT:     Head: Normocephalic and atraumatic.  Cardiovascular:     Rate and Rhythm: Normal rate and regular rhythm.     Heart sounds: Murmur heard.     Systolic murmur is present with a grade of 4/6.  Pulmonary:     Effort: Pulmonary effort is normal. No respiratory distress.     Breath sounds: No wheezing or rhonchi.  Abdominal:     General: Abdomen is flat. Bowel sounds are normal.     Palpations: Abdomen is soft.     Tenderness: There is no abdominal tenderness.       Comments: Femoral access sites healing  Musculoskeletal:     Right lower  leg: No edema.     Left lower leg: No edema.  Skin:    General: Skin is warm and dry.     Findings: No rash.  Neurological:     General: No focal deficit present.     Mental Status: She is alert and oriented to person, place, and time.  Psychiatric:        Mood and Affect: Mood normal.        Behavior: Behavior normal.     Wt Readings from Last 3 Encounters:  11/14/23 166 lb (75.3 kg)  10/24/23 166 lb 6 oz (75.5 kg)  10/24/23 166 lb 6.4 oz (75.5 kg)    BP 112/74   Pulse 73   Ht 5\' 3"  (1.6 m)   Wt 166 lb (75.3 kg)   SpO2 95%   BMI 29.41 kg/m   Assessment and Plan:  Problem List Items Addressed This Visit       Unprioritized   Depression, major, single episode, in partial remission (HCC)   Clinically stable on Prozac .  No SI or HI on evaluation. Plan to continue same medications for now.       Relevant Medications   FLUoxetine  (PROZAC ) 40 MG capsule   Hyperlipidemia associated with type 2 diabetes mellitus (HCC) (Chronic)   Now on Crestor  5 mg daily. No side effects noted.      Spondylosis of lumbar region without myelopathy or radiculopathy   Relevant Medications   metaxalone (SKELAXIN) 800 MG tablet   tiZANidine  (ZANAFLEX ) 4 MG tablet   Chronic systolic CHF (congestive heart failure) (HCC)   S/P aortic valve replacement - Primary   Doing very well. Increase physical activity - walking or stationary bike is best      Other Visit Diagnoses       Acute vaginitis       Aptima swab obtained treat for candida   Relevant Medications   fluconazole  (DIFLUCAN ) 100 MG tablet   Other Relevant Orders   Cervicovaginal ancillary only       Return in about 2 months (around 01/14/2024).    Sheron Dixons, MD [facility] [place]

## 2023-11-14 NOTE — Assessment & Plan Note (Signed)
 Doing very well. Increase physical activity - walking or stationary bike is best

## 2023-11-14 NOTE — Assessment & Plan Note (Signed)
 Now on Crestor  5 mg daily. No side effects noted.

## 2023-11-17 ENCOUNTER — Ambulatory Visit: Payer: Self-pay | Admitting: Internal Medicine

## 2023-11-17 LAB — CERVICOVAGINAL ANCILLARY ONLY
Bacterial Vaginitis (gardnerella): NEGATIVE
Candida Glabrata: NEGATIVE
Candida Vaginitis: POSITIVE — AB
Comment: NEGATIVE
Comment: NEGATIVE
Comment: NEGATIVE
Comment: NEGATIVE
Trichomonas: NEGATIVE

## 2023-11-20 ENCOUNTER — Ambulatory Visit: Admitting: Oncology

## 2023-11-20 ENCOUNTER — Encounter: Payer: Self-pay | Admitting: Oncology

## 2023-11-20 ENCOUNTER — Other Ambulatory Visit (HOSPITAL_COMMUNITY): Payer: Self-pay

## 2023-11-20 ENCOUNTER — Ambulatory Visit

## 2023-11-20 ENCOUNTER — Other Ambulatory Visit

## 2023-11-20 ENCOUNTER — Encounter: Payer: Self-pay | Admitting: Internal Medicine

## 2023-11-25 MED FILL — Promethazine HCl Inj 25 MG/ML: INTRAMUSCULAR | Qty: 0.5 | Status: AC

## 2023-11-26 ENCOUNTER — Inpatient Hospital Stay (HOSPITAL_BASED_OUTPATIENT_CLINIC_OR_DEPARTMENT_OTHER): Admitting: Oncology

## 2023-11-26 ENCOUNTER — Encounter: Payer: Self-pay | Admitting: Oncology

## 2023-11-26 ENCOUNTER — Inpatient Hospital Stay

## 2023-11-26 VITALS — BP 156/65 | HR 56 | Temp 97.3°F | Resp 15 | Wt 161.0 lb

## 2023-11-26 DIAGNOSIS — G629 Polyneuropathy, unspecified: Secondary | ICD-10-CM

## 2023-11-26 DIAGNOSIS — D500 Iron deficiency anemia secondary to blood loss (chronic): Secondary | ICD-10-CM | POA: Diagnosis not present

## 2023-11-26 DIAGNOSIS — C9001 Multiple myeloma in remission: Secondary | ICD-10-CM

## 2023-11-26 DIAGNOSIS — E538 Deficiency of other specified B group vitamins: Secondary | ICD-10-CM | POA: Diagnosis not present

## 2023-11-26 DIAGNOSIS — N184 Chronic kidney disease, stage 4 (severe): Secondary | ICD-10-CM

## 2023-11-26 LAB — CBC WITH DIFFERENTIAL (CANCER CENTER ONLY)
Abs Immature Granulocytes: 0.03 10*3/uL (ref 0.00–0.07)
Basophils Absolute: 0 10*3/uL (ref 0.0–0.1)
Basophils Relative: 0 %
Eosinophils Absolute: 0.1 10*3/uL (ref 0.0–0.5)
Eosinophils Relative: 2 %
HCT: 33.7 % — ABNORMAL LOW (ref 36.0–46.0)
Hemoglobin: 11.3 g/dL — ABNORMAL LOW (ref 12.0–15.0)
Immature Granulocytes: 1 %
Lymphocytes Relative: 19 %
Lymphs Abs: 1.2 10*3/uL (ref 0.7–4.0)
MCH: 28.8 pg (ref 26.0–34.0)
MCHC: 33.5 g/dL (ref 30.0–36.0)
MCV: 86 fL (ref 80.0–100.0)
Monocytes Absolute: 0.4 10*3/uL (ref 0.1–1.0)
Monocytes Relative: 7 %
Neutro Abs: 4.5 10*3/uL (ref 1.7–7.7)
Neutrophils Relative %: 71 %
Platelet Count: 111 10*3/uL — ABNORMAL LOW (ref 150–400)
RBC: 3.92 MIL/uL (ref 3.87–5.11)
RDW: 13 % (ref 11.5–15.5)
WBC Count: 6.3 10*3/uL (ref 4.0–10.5)
nRBC: 0 % (ref 0.0–0.2)

## 2023-11-26 LAB — CMP (CANCER CENTER ONLY)
ALT: 24 U/L (ref 0–44)
AST: 24 U/L (ref 15–41)
Albumin: 4.3 g/dL (ref 3.5–5.0)
Alkaline Phosphatase: 86 U/L (ref 38–126)
Anion gap: 9 (ref 5–15)
BUN: 22 mg/dL (ref 8–23)
CO2: 20 mmol/L — ABNORMAL LOW (ref 22–32)
Calcium: 8.6 mg/dL — ABNORMAL LOW (ref 8.9–10.3)
Chloride: 106 mmol/L (ref 98–111)
Creatinine: 1.28 mg/dL — ABNORMAL HIGH (ref 0.44–1.00)
GFR, Estimated: 46 mL/min — ABNORMAL LOW (ref >60)
Glucose, Bld: 135 mg/dL — ABNORMAL HIGH (ref 70–99)
Potassium: 3.6 mmol/L (ref 3.5–5.1)
Sodium: 135 mmol/L (ref 135–145)
Total Bilirubin: 0.6 mg/dL (ref 0.0–1.2)
Total Protein: 7.1 g/dL (ref 6.5–8.1)

## 2023-11-26 LAB — VITAMIN B12: Vitamin B-12: 662 pg/mL (ref 180–914)

## 2023-11-26 LAB — MAGNESIUM: Magnesium: 1.3 mg/dL — ABNORMAL LOW (ref 1.7–2.4)

## 2023-11-26 MED ORDER — SODIUM CHLORIDE 0.9 % IV SOLN
12.5000 mg | Freq: Once | INTRAVENOUS | Status: AC
  Administered 2023-11-26: 12.5 mg via INTRAVENOUS
  Filled 2023-11-26: qty 0.5

## 2023-11-26 MED ORDER — SODIUM CHLORIDE 0.9% FLUSH
3.0000 mL | Freq: Once | INTRAVENOUS | Status: DC | PRN
Start: 2023-11-26 — End: 2023-11-26
  Filled 2023-11-26: qty 3

## 2023-11-26 MED ORDER — ALTEPLASE 2 MG IJ SOLR
2.0000 mg | Freq: Once | INTRAMUSCULAR | Status: DC | PRN
Start: 2023-11-26 — End: 2023-11-26
  Filled 2023-11-26: qty 2

## 2023-11-26 MED ORDER — MAGNESIUM SULFATE 2 GM/50ML IV SOLN
2.0000 g | Freq: Once | INTRAVENOUS | Status: AC
  Administered 2023-11-26: 2 g via INTRAVENOUS
  Filled 2023-11-26: qty 50

## 2023-11-26 MED ORDER — MAGNESIUM SULFATE 4 GM/100ML IV SOLN
4.0000 g | Freq: Once | INTRAVENOUS | Status: AC
  Administered 2023-11-26: 4 g via INTRAVENOUS
  Filled 2023-11-26: qty 100

## 2023-11-26 MED ORDER — HEPARIN SOD (PORK) LOCK FLUSH 100 UNIT/ML IV SOLN
250.0000 [IU] | Freq: Once | INTRAVENOUS | Status: DC | PRN
Start: 2023-11-26 — End: 2023-11-26
  Filled 2023-11-26: qty 5

## 2023-11-26 MED ORDER — SODIUM CHLORIDE 0.9 % IV SOLN
INTRAVENOUS | Status: DC
  Filled 2023-11-26: qty 250

## 2023-11-26 MED ORDER — CYANOCOBALAMIN 1000 MCG/ML IJ SOLN
1000.0000 ug | Freq: Once | INTRAMUSCULAR | Status: AC
  Administered 2023-11-26: 1000 ug via INTRAMUSCULAR
  Filled 2023-11-26: qty 1

## 2023-11-26 NOTE — Progress Notes (Signed)
 Hematology/Oncology Progress note [phone] [phone]     Chief Complaint: Sarah [name] is a 68 y.o. 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  Light chain ratio is normal. 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 Repeat immunofixation negative. . Labs reviewed and discussed with patient, stable M protein and stable light chain ratio Off Daratumumab  due to frequent infections  Continue acyclovir  prophylaxis.  B12 deficiency continue B12 injections Q 6 weeks.   Hypomagnesemia #Chronic hypomagnesemia proceed with  IV magnesium  today.  Continue weekly magnesium  +/- IV magnesium .     Iron  deficiency anemia due to chronic blood loss Lab Results  Component Value Date   HGB 11.3 (L) 11/26/2023   TIBC 335 06/05/2023   IRONPCTSAT 7 (L) 06/05/2023   FERRITIN 73 06/05/2023    Hb has improved.    Neuropathy Grade 2 chemotherapy-induced neuropathy, Previously on Lyrica  and nortriptyline-not effective Neurology recommend Tramadol  and may consider higher dose of  Nortriptyline in the future.  She prefer to be off treatments for now, symptom is stable. .    Stage 4 chronic kidney disease (HCC) Encourage oral hydration and avoid nephrotoxins.      No orders of the defined types were placed in this encounter.   Follow up  Lab Mag weekly x5  + IV mag.  6 weeks flex lab MD IV mag,  same labs  All questions were answered. The patient knows to call the clinic with any problems, questions or concerns.  Timmy Forbes, MD, PhD [facility]  Hematology Oncology 11/26/2023       PERTINENT ONCOLOGY HISTORY Sarah [name] is a 68 y.o.afemale who has above oncology history reviewed by me today presented for follow up visit for management of multiple myeloma Patient previously followed up by Dr.Corcoran, patient switched care to me on 11/23/20 Extensive medical record review was performed by me  stage III IgA lambda light chain multiple myeloma s/p autologous stem cell transplant on 06/14/2015 at the [facility] [place]  and second autologous stem cell transplant on 05/10/2020 at [facility].   Initial bone marrow revealed 80% plasma cells.  Lambda free light chains were 1340.  She had nephrotic range proteinuria.  She initially underwent induction with RVD.  Revlimid  maintenance was discontinued on 01/21/2017 secondary to intolerance.     07/19/2019 Bone marrow aspirate and biopsy on  revealed a normocellular marrow with but increased lambda-restricted plasma cells (9% aspirate, 40% CD138 immunohistochemistry).  Findings were consistent with recurrent plasma cell myeloma.  Flow cytometry revealed no monoclonal B-cell or phenotypically aberrant T-cell population. Cytogenetics were 76, XX (normal).  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    Lambda light chains have been followed: 22.2 (ratio 0.56) on 07/03/2017, 30.8 (ratio 0.78) on 09/02/2017, 36.9 (ratio 0.40) on 10/21/2017, 37.4 (ratio 0.41) on 12/16/2017, 70.7(ratio 0.31)  on 02/17/2018, 64.2 (ratio 0.27) on 04/07/2018, 78.9 (ratio 0.18) on 05/26/2018, 128.8 (ratio 0.17) on 08/06/2018, 181.5 (ratio 0.13) on 10/08/2018, 130.9 (ratio 0.13) on 10/20/2018, 160.7 (ratio 0.10) on 12/09/2018, 236.6 (ratio 0.07) on 02/01/2019, 363.6 (ratio 0.04) on 03/22/2019, 404.8 (ratio 0.04) on 04/05/2019, 420.7 (ratio  0.03) on 05/24/2019, 573.4 (ratio 0.03) on 06/23/2019, 451.05 (ratio 0.02) on 08/20/2019, 47.2 (ratio 0.13) on 10/25/2019, 22.4 (ratio 0.21) on 11/25/2019, 16.5 (ratio 0.33) on  01/10/2020, 14.6 (ratio 0.34) on 02/07/2020, 13.1 (ratio 0.31) on 03/07/2020, 10.1 (ratio 0.38) on 04/10/2020, and 9.5 (ratio 0.21) on 06/19/2020.   24 hour UPEP on 06/03/2019 revealed kappa free light chains 95.76, lambda free light chains 1,260.71, and ratio 0.08.  24 hour UPEP on 08/23/2019 revealed total protein of 782 mg/24 hrs with lambda free light chains 1,084.16 mg/L and ratio of 0.10 (1.03-31.76).  M spike in urine was 46.1% (361 mg/24 hrs).    Bone survey on 04/08/2016 and 05/28/2017 revealed no definite lytic lesion seen in the visualized skeleton.  Bone survey on 11/19/2018 revealed no suspicious lucent lesions and no acute bony abnormality.  PET scan on 07/12/2019 revealed no focal metabolic activity to suggest active myeloma within the skeleton. There were no lytic lesions identified on the CT portion of the exam or soft tissue plasmacytomas. There was no evidence of multiple myeloma.    Pretreatment RBC phenotype on 09/23/2019 was positive for C, e, DUFFY B, KIDD B, M, S, and s antigen; negative for c, E, KELL, DUFFY A, KIDD A, and N antigen.    09/27/2019 - 10/25/2019; 12/09/2019 - 03/13/2020 6 cycles of daratumumab  and hyaluronidase -fihj, Pomalyst , and Decadron  (DPd) .  Cycle #1 was complicated by fever and neutropenia requiring admission.  Cycle #2 was complicated by pneumonia requiring admission.  Cycle #6 was complicated with an ER evaluation for an elevated lactic acid.   05/10/2020 second autologous stem cell transplant at [facility]   She underwent conditioning with melphalan 140 mg/m2.    She is s/p week #3 Velcade  maintenance (began 08/31/2020 - 09/28/2020).  She has no increase in neuropathy.  She has severe aortic stenosis.  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 Cardiology is following for a possible TAVR in the future.  Echo on 07/05/2020 revealed moderate to severe aortic stenosis with an EF of  50-55%.  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 Zometa  was discontinued in 01/2017.  She has chronic nausea on Phenergan .     B12 deficiency.  B12 was 254 on 04/09/2017, 295 on 08/20/2018, and 391 on 10/08/2018.  She was on oral B12.  She received B12 monthly (last 09/14/2020).  Folate was 12.1 on 01/10/2020.    She has iron  deficiency.  Ferritin was 32 on 07/01/2019.  She received Venofer  on 07/15/2019 and 07/22/2019.   She has hypogammaglobulinemia.  IgG was 245 on 11/25/2019.  She received monthly IVIG (07/22/021 - 03/22/2020).  She received IVIG 400 mg/kg on 01/20/2020, 200 mg/kg on 02/17/2020, and 300 mg/kg on 03/22/2020.  IVIG on 01/20/2020 was complicated by acute renal failure. IgG trough level was 418 on 02/17/2020.  10/15/2019 - 10/20/2019 She was admitted to [facility] with fever and neutropenia.  Cultures were negative.  CXR was negative. She received broad spectrum antibiotics and daily Granix .  She received IVF for acute renal insufficiency due to diarrhea and dehydration.  Creatine was 1.66 on admission and 1.12 on discharge.    03/01/2020 - 03/05/2020 admitted to [facility] from with fever and neutropenia. CXR revealed no active cardiopulmonary disease. Chest CT with contrast revealed no acute intrathoracic pathology. There were findings which could be suggestive of prior granulomatous disease. She was treated with Cefepime  and Vancomycin , then switched to  ciprofloxacin  on 03/03/2020.   05/08/2020 - 12/05/2021The patient was admitted to [facility] from  for autologous bone marrow transplant.   She received conditioning with melphalan 140 mg/m2.  Bone marrow transplant was on 05/10/2020. The patient developed fevers daily from 05/19/2020 - 05/24/2020. Blood cultures were + for strept sanguis bacteremia.  She was treated cefepime , vancomycin , and solumedrol for possible engraftment syndrome. Cefepime  was switched to ceftriaxone ; she completed 2 weeks of ceftriaxone  on 06/03/2020.  She had chemotherapy induced diarrhea.  She experienced urinary retention.  Feb 2022 patient has been on maintenance Velcade  2 mg every 2 weeks.  Chronic diarrhea seen by gastroenterology Dr. Antony Baumgartner and was recommended to avoid artificial sugars in her diet including Diet Coke and coffee mate.  She feels some improvement of her diarrhea frequency since the dietary modification.  GI profile PCR negative, C. difficile toxin A+ B negative, fecal calprotectin 77 12/19/2020 colonoscopy showed 2 subcentimeter polyps in the ascending colon, resected and removed.  Pathology showed tubular adenoma.  No malignancy.  Normal mucosa in the entire examined colon.  Biopsied, pathology showed colonic mucosa with no significant pathological alteration.  Negative for active inflammation and features of chronicity.  Negative for microscopic colitis, dysplasia, and malignanc  Diabetes, patient has discontinued metformin  due to diarrhea and started on glipizide  2.5 mg   03/05/2021-03/09/2021 Patient was hospitalized due to fever and chills, nonproductive cough, shortness of breath. X-ray showed right lower lobe atelectasis versus pneumonia.  Blood cultures negative.  Urine culture showed 60,000 colonies of Enterococcus facialis.  Patient received antibiotics. Patient also had abdominal pelvis CT scan for work-up of intermittent lower abdomen pain.No acute abnormality.   05/14/21 last dose of Velcade  maintenance.  establish care with neurology Dr. Cullen Dose and her neuropathy regimen has been switched from gabapentin  to Lyrica . 05/29/2021 patient got influenza   neuropathy medication has been switched from gabapentin /Lyrica  to nortriptyline.  08/18/2021 Hospitalized due to pneumonia from metapnuemovirus, hospitalization was complicated with NSTEMI. She received heparin  gtt.  Treated with antibiotics for coverage of pneumonia,  diuretics for pulmonary edema. She received IVIG 400mg /kg x 1 for hypoglobulinemia.  Discharged on 08/25/21 with Augmentin  and Zithromax .   May 2023, Daratumumab  being held for [facility] infectious disease physician Dr. Norberta Beans due to frequent infection.  Diagnosed with CHF [HFrEF] -NYHA class II-III, she follows up with cardiology, started on entresto   hospitalization due to CHF exacerbation, community acquired pneumonia.   Presented to emergency room on 07/01/2022 Respiratory panel RT-PCR negative for COVID-19, influenza and RSV.   INTERVAL HISTORY Sarah [name] is a 68 y.o. female who has above history reviewed by me today presents for follow up visit for management of multiple myeloma.  Patient has been on weekly magnesium  infusion as needed hypomagnesemia + CHF [nonischemic cardiomyopathy], aortic stenosis, follows up with cardiology. 11/06/2023 s/p TAVR for aortic valve stenosis. She reports feeling well.  No new complaints.   Past Medical History:  Diagnosis Date   Anemia    Anxiety    Aortic stenosis    a. 05/2020 Echo: EF 45-50%, sev AS - seen by TAVR team @ [facility] - CTA sugg of tricuspid valve w/ fusing of 2 leaflets-TAVR deferred in setting of acute infxn; b. 07/2020 Echo: EF 50-55%, mod-sev AS; c. 12/2020 Echo: EF>55%. Mod-sev paradoxical low-flow low-gradient AS; d. 08/2021 Echo: EF 35-40%, severe AS, triv AI, mild MR.   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 Variable EF over time; b. 08/2017 Echo: EF 40%; b. 03/2020 Echo: EF 55-60%; c. 05/2020 Echo: EF 45-50%; d. 07/2020 Echo: EF 50-55%; e. 12/2020 Echo: EF>55%; f. 08/2021 Echo: EF 35-40%.   Chronic heart failure with preserved ejection fraction (HFpEF) (HCC)    a. Variable EF over time; b. 08/2017 Echo: EF 40%; b. 03/2020 Echo: EF 55-60%; c. 05/2020 Echo: EF 45-50%; d. 07/2020 Echo: EF 50-55%; e. 12/2020 Echo: EF>55%; f. 08/2021 Echo: EF 35-40%, no RV, mildly dil LA, mild MR, triv AI, severe AS.   Chronic  pain syndrome 07/08/2016   CKD (chronic kidney disease) stage 3, GFR 30-59 ml/min (HCC)    Depression    Diabetes mellitus (HCC)    Dizziness    Fatty liver    Frequent falls    GERD (gastroesophageal reflux disease)    Gout    Heart murmur    History of blood transfusion    History of bone marrow transplant (HCC)    History of uterine fibroid    Hx of cardiac catheterization    a. 01/2016 Cath ([place]  - after abnl nuc): Nl cors.   Hypertension    Hypomagnesemia    IDA (iron  deficiency anemia)    Multiple myeloma (HCC)    NSTEMI (non-ST elevated myocardial infarction) (HCC) 08/21/2021   Personal history of chemotherapy    Pleurisy 04/09/2022   PSVT (paroxysmal supraventricular tachycardia) (HCC)    a. S/p RFCA 1999 ([place] ).   PVC's (premature ventricular contractions)    a. Well-managed w/ bisoprolol  in outpt setting.   Renal cyst     Past Surgical History:  Procedure Laterality Date   ABDOMINAL HYSTERECTOMY     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   CARDIAC ELECTROPHYSIOLOGY MAPPING AND ABLATION     CARPAL TUNNEL RELEASE Bilateral    CHOLECYSTECTOMY  2008   COLONOSCOPY WITH PROPOFOL  N/A 05/07/2017   Procedure: COLONOSCOPY WITH PROPOFOL ;  Surgeon: Luke Salaam, MD;  Location: [facility] ENDOSCOPY;  Service: Gastroenterology;  Laterality: N/A;   COLONOSCOPY WITH PROPOFOL  N/A 12/19/2020   Procedure: COLONOSCOPY WITH PROPOFOL ;  Surgeon: Luke Salaam, MD;  Location: [facility] ENDOSCOPY;  Service: Gastroenterology;  Laterality: N/A;   ESOPHAGOGASTRODUODENOSCOPY (EGD) WITH PROPOFOL  N/A 05/07/2017   Procedure: ESOPHAGOGASTRODUODENOSCOPY (EGD) WITH PROPOFOL ;  Surgeon: Luke Salaam, MD;  Location: [facility] ENDOSCOPY;  Service: Gastroenterology;  Laterality: N/A;   FOOT SURGERY Bilateral    INCONTINENCE SURGERY  2009   INTERSTIM IMPLANT PLACEMENT     other     over active bladder   OTHER SURGICAL HISTORY     bladder stimulator    PARTIAL HYSTERECTOMY  03/1996   fibroids   PORTA CATH  INSERTION N/A 03/10/2019   Procedure: PORTA CATH INSERTION;  Surgeon: Celso College, MD;  Location: [facility] INVASIVE CV LAB;  Service: Cardiovascular;  Laterality: N/A;   TONSILLECTOMY  2007   TRANSCATHETER AORTIC VALVE REPLACEMENT, TRANSFEMORAL  11/06/2023    Family History  Problem Relation Age of Onset   Colon cancer Father    Renal Disease Father    Diabetes Mellitus II Father    Melanoma Paternal Grandmother    Breast cancer Maternal Aunt 65   Anemia Mother    Heart disease Mother    Heart failure Mother    Renal Disease Mother    Congestive Heart Failure Mother    Heart disease Maternal Uncle    Throat cancer Maternal Uncle    Lung cancer Maternal  Uncle    Liver disease Maternal Uncle    Heart failure Maternal Uncle    Hearing loss Son 34       Suicide     Social History:  reports that she quit smoking about 32 years ago. Her smoking use included cigarettes. She started smoking about 52 years ago. She has a 20 pack-year smoking history. She has never used smokeless tobacco. She reports current alcohol use of about 2.0 standard drinks of alcohol per week. She reports that she does not use drugs.  She is on disability. She notes exposure to perchloroethylene [facility]   Allergies:  Allergies  Allergen Reactions   Oxycodone-Acetaminophen  Anaphylaxis    Swelling and rash  acetaminophen  / oxycodone   Celebrex  [Celecoxib ] Diarrhea   Codeine     Plerixafor     In 2016 during ASCT collection patient developed fever to 103.53F and required hospitalization   Benadryl  [Diphenhydramine ] Palpitations   Morphine  Itching and Rash   Ondansetron  Diarrhea   Tylenol  [Acetaminophen ] Itching and Rash    Current Medications: Current Outpatient Medications  Medication Sig Dispense Refill   allopurinol  (ZYLOPRIM ) 100 MG tablet TAKE 1 TABLET(100 MG) BY MOUTH DAILY as needed 90 tablet 1   amiodarone (PACERONE) 200 MG tablet Take 200 mg by mouth daily.     bisoprolol  (ZEBETA ) 10 MG tablet Take  1 tablet (10 mg total) by mouth daily. 90 tablet 1   cholestyramine  (QUESTRAN ) 4 GM/DOSE powder Take 1 packet (4 g total) by mouth 2 (two) times daily with a meal. 240 packet 2   diazepam  (VALIUM ) 5 MG tablet Take 0.5-1 tablets (2.5-5 mg total) by mouth at bedtime as needed (insomnia). 30 tablet 3   diclofenac  sodium (VOLTAREN ) 1 % GEL Apply 2 g topically 4 (four) times daily. 100 g 1   diphenoxylate -atropine  (LOMOTIL ) 2.5-0.025 MG tablet Take 2 tablets by mouth 4 (four) times daily as needed for diarrhea or loose stools. 180 tablet 3   ENTRESTO  24-26 MG Take 1 tablet by mouth 2 (two) times daily.     FLUoxetine  (PROZAC ) 40 MG capsule Take 1 capsule (40 mg total) by mouth daily. 90 capsule 1   furosemide  (LASIX ) 40 MG tablet Take 0.5 tablets (20 mg total) by mouth daily. Home dose.     glipiZIDE  (GLUCOTROL  XL) 2.5 MG 24 hr tablet Take 1 tablet (2.5 mg total) by mouth daily with breakfast. 90 tablet 1   glucose blood test strip      JARDIANCE 10 MG TABS tablet Take 10 mg by mouth daily.     lidocaine  (XYLOCAINE ) 2 % solution Use as directed 15 mLs in the mouth or throat every 3 (three) hours as needed for mouth pain (swish and spit). 100 mL 0   metaxalone  (SKELAXIN ) 800 MG tablet Take 1 tablet (800 mg total) by mouth in the morning. 30 tablet 0   Multiple Vitamin (MULTIVITAMIN) tablet Take 1 tablet by mouth daily.     omeprazole  (PRILOSEC) 40 MG capsule TAKE 1 CAPSULE IN THE MORNING AND TAKE 1 CAPSULE AT BEDTIME 180 capsule 3   potassium chloride  SA (KLOR-CON  M) 20 MEQ tablet Take 1 tablet (20 mEq total) by mouth daily. 7 tablet 0   promethazine  (PHENERGAN ) 25 MG tablet Take 1 tablet (25 mg total) by mouth every 6 (six) hours as needed for nausea or vomiting. 90 tablet 1   rosuvastatin  (CRESTOR ) 5 MG tablet Take 1 tablet (5 mg total) by mouth daily. 90 tablet 1   spironolactone (  ALDACTONE) 25 MG tablet Take by mouth.     tiZANidine  (ZANAFLEX ) 4 MG tablet Take 1 tablet (4 mg total) by mouth at  bedtime. 90 tablet 0   traMADol  (ULTRAM ) 50 MG tablet      traZODone  (DESYREL ) 100 MG tablet TAKE 1 TABLET AT BEDTIME 90 tablet 3   No current facility-administered medications for this visit.   Facility-Administered Medications Ordered in Other Visits  Medication Dose Route Frequency Provider Last Rate Last Admin   0.9 %  sodium chloride  infusion   Intravenous Continuous Timmy Forbes, MD 10 mL/hr at 11/26/23 1034 New Bag at 11/26/23 1034   alteplase  (CATHFLO ACTIVASE ) injection 2 mg  2 mg Intracatheter Once PRN Timmy Forbes, MD       heparin  lock flush 100 unit/mL  250 Units Intracatheter Once PRN Timmy Forbes, MD       sodium chloride  flush (NS) 0.9 % injection 10 mL  10 mL Intravenous PRN Timmy Forbes, MD   10 mL at 04/02/21 0904   sodium chloride  flush (NS) 0.9 % injection 10 mL  10 mL Intracatheter Once PRN Timmy Forbes, MD       sodium chloride  flush (NS) 0.9 % injection 3 mL  3 mL Intracatheter Once PRN Timmy Forbes, MD        Review of Systems  Constitutional:  Negative for chills, fever, malaise/fatigue and weight loss.  HENT:  Negative for congestion and sore throat.   Eyes:  Negative for redness.  Respiratory:  Negative for cough, shortness of breath and wheezing.   Cardiovascular:  Negative for chest pain, palpitations and leg swelling.  Gastrointestinal:  Positive for diarrhea. Negative for abdominal pain, blood in stool, nausea and vomiting.  Genitourinary:  Negative for dysuria.  Musculoskeletal:  Positive for back pain and joint pain. Negative for myalgias.  Skin:  Negative for rash.  Neurological:  Positive for tingling and sensory change. Negative for dizziness and tremors.  Endo/Heme/Allergies:  Does not bruise/bleed easily.  Psychiatric/Behavioral:  Negative for hallucinations.     Performance status (ECOG): 1  Vitals Blood pressure (!) 156/65, pulse (!) 56, temperature (!) 97.3 F (36.3 C), temperature source Tympanic, resp. rate 15, weight 161 lb (73 kg), SpO2 100%.  Physical  Exam Constitutional:      General: She is not in acute distress.    Appearance: She is not diaphoretic.  HENT:     Head: Normocephalic and atraumatic.  Eyes:     General: No scleral icterus. Cardiovascular:     Rate and Rhythm: Normal rate.     Heart sounds: Murmur heard.  Pulmonary:     Effort: Pulmonary effort is normal. No respiratory distress.     Breath sounds: Normal breath sounds. No wheezing.  Abdominal:     General: There is no distension.  Musculoskeletal:        General: Normal range of motion.     Cervical back: Normal range of motion and neck supple.  Skin:    General: Skin is warm and dry.     Findings: No erythema.  Neurological:     Mental Status: She is alert and oriented to person, place, and time. Mental status is at baseline.     Motor: No abnormal muscle tone.  Psychiatric:        Mood and Affect: Mood and affect normal.      Laboratory data    Latest Ref Rng & Units 11/26/2023    9:35 AM 10/08/2023   10:34 AM  08/28/2023   10:06 AM  CBC  WBC 4.0 - 10.5 K/uL 6.3  6.8  6.3   Hemoglobin 12.0 - 15.0 g/dL 16.1  09.6  04.5   Hematocrit 36.0 - 46.0 % 33.7  38.7  36.8   Platelets 150 - 400 K/uL 111  134  110       Latest Ref Rng & Units 11/26/2023    9:33 AM 10/08/2023   10:34 AM 08/28/2023   10:06 AM  CMP  Glucose 70 - 99 mg/dL 409  811  914   BUN 8 - 23 mg/dL 22  28  28    Creatinine 0.44 - 1.00 mg/dL 7.82  9.56  2.13   Sodium 135 - 145 mmol/L 135  137  137   Potassium 3.5 - 5.1 mmol/L 3.6  4.3  5.0   Chloride 98 - 111 mmol/L 106  103  104   CO2 22 - 32 mmol/L 20  22  24    Calcium  8.9 - 10.3 mg/dL 8.6  8.6  9.4   Total Protein 6.5 - 8.1 g/dL 7.1  7.4  7.6   Total Bilirubin 0.0 - 1.2 mg/dL 0.6  0.4  0.4   Alkaline Phos 38 - 126 U/L 86  80  105   AST 15 - 41 U/L 24  42  34   ALT 0 - 44 U/L 24  49  46

## 2023-11-26 NOTE — Assessment & Plan Note (Signed)
#  Chronic hypomagnesemia proceed with  IV magnesium today.  Continue weekly magnesium +/- IV magnesium.

## 2023-11-26 NOTE — Assessment & Plan Note (Signed)
 continue B12 injections Q 6 weeks.

## 2023-11-26 NOTE — Assessment & Plan Note (Signed)
 Lab Results  Component Value Date   HGB 11.3 (L) 11/26/2023   TIBC 335 06/05/2023   IRONPCTSAT 7 (L) 06/05/2023   FERRITIN 73 06/05/2023    Hb has improved.

## 2023-11-26 NOTE — Assessment & Plan Note (Signed)
 Grade 2 chemotherapy-induced neuropathy, Previously on Lyrica and nortriptyline-not effective Neurology recommend Tramadol and may consider higher dose of  Nortriptyline in the future.  She prefer to be off treatments for now, symptom is stable. Marland Kitchen

## 2023-11-26 NOTE — Patient Instructions (Signed)
 Magnesium Sulfate Injection What is this medication? MAGNESIUM SULFATE (mag NEE zee um SUL fate) prevents and treats low levels of magnesium in your body. It may also be used to prevent and treat seizures during pregnancy in people with high blood pressure disorders, such as preeclampsia or eclampsia. Magnesium plays an important role in maintaining the health of your muscles and nervous system. This medicine may be used for other purposes; ask your health care provider or pharmacist if you have questions. What should I tell my care team before I take this medication? They need to know if you have any of these conditions: Heart disease History of irregular heart beat Kidney disease An unusual or allergic reaction to magnesium sulfate, medications, foods, dyes, or preservatives Pregnant or trying to get pregnant Breast-feeding How should I use this medication? This medication is for infusion into a vein. It is given in a hospital or clinic setting. Talk to your care team about the use of this medication in children. While this medication may be prescribed for selected conditions, precautions do apply. Overdosage: If you think you have taken too much of this medicine contact a poison control center or emergency room at once. NOTE: This medicine is only for you. Do not share this medicine with others. What if I miss a dose? This does not apply. What may interact with this medication? Certain medications for anxiety or sleep Certain medications for seizures, such phenobarbital Digoxin Medications that relax muscles for surgery Narcotic medications for pain This list may not describe all possible interactions. Give your health care provider a list of all the medicines, herbs, non-prescription drugs, or dietary supplements you use. Also tell them if you smoke, drink alcohol, or use illegal drugs. Some items may interact with your medicine. What should I watch for while using this  medication? Your condition will be monitored carefully while you are receiving this medication. You may need blood work done while you are receiving this medication. What side effects may I notice from receiving this medication? Side effects that you should report to your care team as soon as possible: Allergic reactions--skin rash, itching, hives, swelling of the face, lips, tongue, or throat High magnesium level--confusion, drowsiness, facial flushing, redness, sweating, muscle weakness, fast or irregular heartbeat, trouble breathing Low blood pressure--dizziness, feeling faint or lightheaded, blurry vision Side effects that usually do not require medical attention (report to your care team if they continue or are bothersome): Headache Nausea This list may not describe all possible side effects. Call your doctor for medical advice about side effects. You may report side effects to FDA at 1-800-FDA-1088. Where should I keep my medication? This medication is given in a hospital or clinic and will not be stored at home. NOTE: This sheet is a summary. It may not cover all possible information. If you have questions about this medicine, talk to your doctor, pharmacist, or health care provider.  2024 Elsevier/Gold Standard (2021-02-28 00:00:00)

## 2023-11-26 NOTE — Assessment & Plan Note (Addendum)
 Encourage oral hydration and avoid nephrotoxins.

## 2023-11-26 NOTE — Assessment & Plan Note (Signed)
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  Light chain ratio is normal. 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 Repeat immunofixation negative. . Labs reviewed and discussed with patient, stable M protein and stable light chain ratio Off Daratumumab due to frequent infections  Continue acyclovir prophylaxis.

## 2023-11-27 LAB — KAPPA/LAMBDA LIGHT CHAINS
Kappa free light chain: 21 mg/L — ABNORMAL HIGH (ref 3.3–19.4)
Kappa, lambda light chain ratio: 0.95 (ref 0.26–1.65)
Lambda free light chains: 22.2 mg/L (ref 5.7–26.3)

## 2023-11-28 LAB — MULTIPLE MYELOMA PANEL, SERUM
Albumin SerPl Elph-Mcnc: 3.8 g/dL (ref 2.9–4.4)
Albumin/Glob SerPl: 1.2 (ref 0.7–1.7)
Alpha 1: 0.3 g/dL (ref 0.0–0.4)
Alpha2 Glob SerPl Elph-Mcnc: 1 g/dL (ref 0.4–1.0)
B-Globulin SerPl Elph-Mcnc: 1.2 g/dL (ref 0.7–1.3)
Gamma Glob SerPl Elph-Mcnc: 0.7 g/dL (ref 0.4–1.8)
Globulin, Total: 3.2 g/dL (ref 2.2–3.9)
IgA: 277 mg/dL (ref 87–352)
IgG (Immunoglobin G), Serum: 715 mg/dL (ref 586–1602)
IgM (Immunoglobulin M), Srm: 26 mg/dL (ref 26–217)
Total Protein ELP: 7 g/dL (ref 6.0–8.5)

## 2023-12-03 ENCOUNTER — Inpatient Hospital Stay

## 2023-12-04 ENCOUNTER — Inpatient Hospital Stay: Attending: Oncology

## 2023-12-04 ENCOUNTER — Inpatient Hospital Stay

## 2023-12-04 VITALS — BP 118/56 | HR 50 | Resp 14

## 2023-12-04 DIAGNOSIS — E538 Deficiency of other specified B group vitamins: Secondary | ICD-10-CM | POA: Diagnosis not present

## 2023-12-04 DIAGNOSIS — Z8249 Family history of ischemic heart disease and other diseases of the circulatory system: Secondary | ICD-10-CM | POA: Insufficient documentation

## 2023-12-04 DIAGNOSIS — Z822 Family history of deafness and hearing loss: Secondary | ICD-10-CM | POA: Insufficient documentation

## 2023-12-04 DIAGNOSIS — Z800 Family history of malignant neoplasm of digestive organs: Secondary | ICD-10-CM | POA: Diagnosis not present

## 2023-12-04 DIAGNOSIS — Z841 Family history of disorders of kidney and ureter: Secondary | ICD-10-CM | POA: Diagnosis not present

## 2023-12-04 DIAGNOSIS — C9001 Multiple myeloma in remission: Secondary | ICD-10-CM

## 2023-12-04 DIAGNOSIS — Z803 Family history of malignant neoplasm of breast: Secondary | ICD-10-CM | POA: Insufficient documentation

## 2023-12-04 DIAGNOSIS — Z79899 Other long term (current) drug therapy: Secondary | ICD-10-CM | POA: Insufficient documentation

## 2023-12-04 DIAGNOSIS — Z808 Family history of malignant neoplasm of other organs or systems: Secondary | ICD-10-CM | POA: Insufficient documentation

## 2023-12-04 DIAGNOSIS — Z833 Family history of diabetes mellitus: Secondary | ICD-10-CM | POA: Diagnosis not present

## 2023-12-04 DIAGNOSIS — G629 Polyneuropathy, unspecified: Secondary | ICD-10-CM | POA: Insufficient documentation

## 2023-12-04 DIAGNOSIS — Z801 Family history of malignant neoplasm of trachea, bronchus and lung: Secondary | ICD-10-CM | POA: Diagnosis not present

## 2023-12-04 DIAGNOSIS — Z832 Family history of diseases of the blood and blood-forming organs and certain disorders involving the immune mechanism: Secondary | ICD-10-CM | POA: Diagnosis not present

## 2023-12-04 DIAGNOSIS — Z8379 Family history of other diseases of the digestive system: Secondary | ICD-10-CM | POA: Diagnosis not present

## 2023-12-04 DIAGNOSIS — N184 Chronic kidney disease, stage 4 (severe): Secondary | ICD-10-CM | POA: Insufficient documentation

## 2023-12-04 DIAGNOSIS — Z818 Family history of other mental and behavioral disorders: Secondary | ICD-10-CM | POA: Insufficient documentation

## 2023-12-04 LAB — MAGNESIUM: Magnesium: 1.5 mg/dL — ABNORMAL LOW (ref 1.7–2.4)

## 2023-12-04 MED ORDER — SODIUM CHLORIDE 0.9% FLUSH
10.0000 mL | Freq: Once | INTRAVENOUS | Status: DC | PRN
Start: 2023-12-04 — End: 2023-12-04
  Filled 2023-12-04: qty 10

## 2023-12-04 MED ORDER — HEPARIN SOD (PORK) LOCK FLUSH 100 UNIT/ML IV SOLN
500.0000 [IU] | Freq: Once | INTRAVENOUS | Status: AC | PRN
  Administered 2023-12-04: 500 [IU]
  Filled 2023-12-04: qty 5

## 2023-12-04 MED ORDER — SODIUM CHLORIDE 0.9 % IV SOLN
12.5000 mg | Freq: Once | INTRAVENOUS | Status: AC
  Administered 2023-12-04: 12.5 mg via INTRAVENOUS
  Filled 2023-12-04: qty 0.5

## 2023-12-04 MED ORDER — MAGNESIUM SULFATE 4 GM/100ML IV SOLN
4.0000 g | Freq: Once | INTRAVENOUS | Status: AC
  Administered 2023-12-04: 4 g via INTRAVENOUS
  Filled 2023-12-04: qty 100

## 2023-12-04 MED ORDER — SODIUM CHLORIDE 0.9 % IV SOLN
INTRAVENOUS | Status: DC
  Filled 2023-12-04: qty 250

## 2023-12-10 ENCOUNTER — Inpatient Hospital Stay

## 2023-12-10 VITALS — BP 141/70 | HR 58 | Resp 18

## 2023-12-10 DIAGNOSIS — C9001 Multiple myeloma in remission: Secondary | ICD-10-CM

## 2023-12-10 LAB — MAGNESIUM: Magnesium: 1.6 mg/dL — ABNORMAL LOW (ref 1.7–2.4)

## 2023-12-10 MED ORDER — SODIUM CHLORIDE 0.9 % IV SOLN
12.5000 mg | Freq: Once | INTRAVENOUS | Status: AC
  Administered 2023-12-10: 12.5 mg via INTRAVENOUS
  Filled 2023-12-10: qty 0.5

## 2023-12-10 MED ORDER — ALTEPLASE 2 MG IJ SOLR
2.0000 mg | Freq: Once | INTRAMUSCULAR | Status: DC | PRN
Start: 2023-12-10 — End: 2023-12-10
  Filled 2023-12-10: qty 2

## 2023-12-10 MED ORDER — SODIUM CHLORIDE 0.9 % IV SOLN
INTRAVENOUS | Status: DC
  Filled 2023-12-10: qty 250

## 2023-12-10 MED ORDER — SODIUM CHLORIDE 0.9% FLUSH
10.0000 mL | Freq: Once | INTRAVENOUS | Status: DC | PRN
Start: 2023-12-10 — End: 2023-12-10
  Filled 2023-12-10: qty 10

## 2023-12-10 MED ORDER — MAGNESIUM SULFATE 2 GM/50ML IV SOLN
2.0000 g | Freq: Once | INTRAVENOUS | Status: AC
  Administered 2023-12-10: 2 g via INTRAVENOUS
  Filled 2023-12-10: qty 50

## 2023-12-10 MED ORDER — HEPARIN SOD (PORK) LOCK FLUSH 100 UNIT/ML IV SOLN
500.0000 [IU] | Freq: Once | INTRAVENOUS | Status: AC | PRN
  Administered 2023-12-10: 500 [IU]
  Filled 2023-12-10: qty 5

## 2023-12-12 ENCOUNTER — Encounter: Payer: Medicare PPO | Admitting: Internal Medicine

## 2023-12-17 ENCOUNTER — Inpatient Hospital Stay

## 2023-12-18 ENCOUNTER — Inpatient Hospital Stay

## 2023-12-18 VITALS — BP 110/77 | HR 62 | Temp 96.9°F | Resp 18

## 2023-12-18 DIAGNOSIS — C9001 Multiple myeloma in remission: Secondary | ICD-10-CM | POA: Diagnosis not present

## 2023-12-18 LAB — MAGNESIUM: Magnesium: 1.8 mg/dL (ref 1.7–2.4)

## 2023-12-18 MED ORDER — HEPARIN SOD (PORK) LOCK FLUSH 100 UNIT/ML IV SOLN
500.0000 [IU] | Freq: Once | INTRAVENOUS | Status: AC | PRN
  Administered 2023-12-18: 500 [IU]
  Filled 2023-12-18: qty 5

## 2023-12-18 MED ORDER — SODIUM CHLORIDE 0.9 % IV SOLN
INTRAVENOUS | Status: DC
  Filled 2023-12-18: qty 250

## 2023-12-18 MED ORDER — SODIUM CHLORIDE 0.9% FLUSH
10.0000 mL | Freq: Once | INTRAVENOUS | Status: DC | PRN
Start: 2023-12-18 — End: 2023-12-18
  Filled 2023-12-18: qty 10

## 2023-12-18 MED ORDER — SODIUM CHLORIDE 0.9 % IV SOLN
12.5000 mg | Freq: Once | INTRAVENOUS | Status: AC
  Administered 2023-12-18: 12.5 mg via INTRAVENOUS
  Filled 2023-12-18: qty 0.5

## 2023-12-18 NOTE — Patient Instructions (Signed)
 Promethazine  Injection What is this medication? PROMETHAZINE  (proe METH a zeen) prevents and treats the symptoms of an allergic reaction. It works by blocking histamine, a substance released by the body during an allergic reaction. It may also help you relax, go to sleep, and relieve nausea, vomiting, or pain before or after procedures. It can also treat motion sickness. It works by helping your nervous system calm down by blocking substances in the body that may cause nausea and vomiting. It belongs to a group of medications called antihistamines. This medicine may be used for other purposes; ask your health care provider or pharmacist if you have questions. COMMON BRAND NAME(S): Anergan-50, Pentazine, Phenergan  What should I tell my care team before I take this medication? They need to know if you have any of these conditions: Blockage in your bowels Diabetes Glaucoma Have trouble controlling your muscles Heart disease Liver disease Low blood cell levels (white cells, red cells, and platelets) Lung or breathing disease, such as asthma Parkinson disease Prostate disease Seizures Stomach or intestine problems Trouble passing urine An unusual or allergic reaction to promethazine , sulfites, other medications, foods, dyes, or preservatives Pregnant or trying to get pregnant Breastfeeding How should I use this medication? This medication is for injection into a muscle, or into a vein. It is given in a hospital or clinic setting. Talk to your care team about the use of this medication in children. This medication should not be given to infants and children younger than 97 years old. Overdosage: If you think you have taken too much of this medicine contact a poison control center or emergency room at once. NOTE: This medicine is only for you. Do not share this medicine with others. What if I miss a dose? This does not apply. What may interact with this medication? Alcohol Antihistamines for  allergy, cough, and cold Atropine  Certain medications for anxiety or sleep Certain medications for bladder problems, such as oxybutynin  or tolterodine  Certain medications for depression, such as amitriptyline, fluoxetine , sertraline Certain medications for Parkinson disease, such as benztropine or trihexyphenidyl Certain medications for seizures, such as phenobarbital, primidone, phenytoin Certain medications for stomach problems, such as dicyclomine, hyoscyamine Certain medications for travel sickness, such as scopolamine  Epinephrine  General anesthetics, such as halothane, isoflurane, methoxyflurane, propofol  Ipratropium MAOIs, such as Marplan, Nardil, and Parnate Medications for blood pressure Medications that relax muscles for surgery Metoclopramide  Opioids This list may not describe all possible interactions. Give your health care provider a list of all the medicines, herbs, non-prescription drugs, or dietary supplements you use. Also tell them if you smoke, drink alcohol, or use illegal drugs. Some items may interact with your medicine. What should I watch for while using this medication? Your condition will be monitored carefully while you are receiving this medication. Your care team will discuss with you the risks and the benefits of using this medication. This medication has caused serious side effects in some patients after it was injected into a vein. Watch closely for any signs or symptoms of a local reaction like burning, pain, redness, swelling, and blistering and tell your care team immediately if any occur. These symptoms may occur when you receive the injection or may occur hours or even days after the injection. This medication may affect your coordination, reaction time, or judgment. Do not drive or operate machinery until you know how this medication affects you. Sit up or stand slowly to reduce the risk of dizzy or fainting spells. Drinking alcohol with this medication can  increase the risk of these side effects. Your mouth may get dry. Chewing sugarless gum or sucking hard candy and drinking plenty of water may help. This medication may cause dry eyes and blurred vision. If you wear contact lenses, you may feel some discomfort. Lubricating eye drops may help. See your care team if the problem does not go away or is severe. This medication can make you more sensitive to the sun. Keep out of the sun. If you cannot avoid being in the sun, wear protective clothing and sunscreen. Do not use sun lamps, tanning beds, or tanning booths. This medication may increase blood sugar. The risk may be higher in patients who already have diabetes. Ask your care team what you can do to lower your risk of diabetes while taking this medication. What side effects may I notice from receiving this medication? Side effects that you should report to your care team as soon as possible: Allergic reactions--skin rash, itching, hives, swelling of the face, lips, tongue, or throat CNS depression--slow or shallow breathing, shortness of breath, feeling faint, dizziness, confusion, trouble staying awake High fever stiff muscles, increased sweating, fast or irregular heartbeat, and confusion, which may be signs of neuroleptic malignant syndrome Infection--fever, chills, cough, or sore throat Liver injury--right upper belly pain, loss of appetite, nausea, light-colored stool, dark yellow or brown urine, yellowing skin or eyes, unusual weakness or fatigue Pain, redness, or irritation at injection site Seizures Sudden eye pain or change in vision such as blurry vision, seeing halos around lights, vision loss Trouble passing urine Uncontrolled and repetitive body movements, muscle stiffness or spasms, tremors or shaking, loss of balance or coordination, restlessness, shuffling walk, which may be signs of extrapyramidal symptoms (EPS) Side effects that usually do not require medical attention (report to  your care team if they continue or are bothersome): Confusion Constipation Dizziness Drowsiness Dry mouth Sensitivity to light Vivid dreams or nightmares This list may not describe all possible side effects. Call your doctor for medical advice about side effects. You may report side effects to FDA at 1-800-FDA-1088. Where should I keep my medication? This medication is given in a hospital or clinic and will not be stored at home. NOTE: This sheet is a summary. It may not cover all possible information. If you have questions about this medicine, talk to your doctor, pharmacist, or health care provider.  2024 Elsevier/Gold Standard (2021-12-29 00:00:00)

## 2023-12-19 ENCOUNTER — Encounter: Payer: Self-pay | Admitting: Oncology

## 2023-12-19 ENCOUNTER — Encounter: Payer: Self-pay | Admitting: Internal Medicine

## 2023-12-19 ENCOUNTER — Other Ambulatory Visit: Payer: Self-pay | Admitting: Internal Medicine

## 2023-12-19 DIAGNOSIS — F5101 Primary insomnia: Secondary | ICD-10-CM

## 2023-12-19 DIAGNOSIS — B354 Tinea corporis: Secondary | ICD-10-CM

## 2023-12-19 MED ORDER — NYSTATIN 100000 UNIT/GM EX CREA: 1.0000 | TOPICAL_CREAM | Freq: Two times a day (BID) | CUTANEOUS | 0 refills | Status: DC

## 2023-12-19 NOTE — Progress Notes (Unsigned)
 Date:  12/19/2023   Name:  Sarah [name]   DOB:  1956/05/07   MRN:  [id number]   Chief Complaint: No chief complaint on file.  HPI  Review of Systems   Lab Results  Component Value Date   NA 135 11/26/2023   K 3.6 11/26/2023   CO2 20 (L) 11/26/2023   GLUCOSE 135 (H) 11/26/2023   BUN 22 11/26/2023   CREATININE 1.28 (H) 11/26/2023   CALCIUM  8.6 (L) 11/26/2023   GFRNONAA 46 (L) 11/26/2023   Lab Results  Component Value Date   CHOL 164 12/11/2022   HDL 67 12/11/2022   LDLCALC 83 12/11/2022   TRIG 68 12/11/2022   CHOLHDL 2.4 12/11/2022   Lab Results  Component Value Date   TSH 1.450 07/22/2022   Lab Results  Component Value Date   HGBA1C 6.2 (A) 06/02/2023   Lab Results  Component Value Date   WBC 6.3 11/26/2023   HGB 11.3 (L) 11/26/2023   HCT 33.7 (L) 11/26/2023   MCV 86.0 11/26/2023   PLT 111 (L) 11/26/2023   Lab Results  Component Value Date   ALT 24 11/26/2023   AST 24 11/26/2023   ALKPHOS 86 11/26/2023   BILITOT 0.6 11/26/2023   Lab Results  Component Value Date   VD25OH 25.64 (L) 12/11/2022     Patient Active Problem List   Diagnosis Date Noted   S/P aortic valve replacement 11/14/2023   Amaurosis fugax of left eye 10/24/2023   Double vision with both eyes open 10/24/2023   Stage 4 chronic kidney disease (HCC) 10/08/2023   Jaw pain 03/13/2023   Prolonged QT interval 03/07/2023   Vitamin D  deficiency 12/09/2022   Diabetes mellitus treated with oral medication (HCC) 12/09/2022   NICM (nonischemic cardiomyopathy) (HCC) 08/05/2022   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   Overweight (BMI 25.0-29.9) 02/04/2022   Complete left bundle branch block 09/06/2021   Anemia of chronic renal failure, stage 3a (HCC) 09/06/2021   Gastroesophageal reflux disease without esophagitis 09/06/2021   Chronic systolic CHF (congestive heart failure) (HCC) 08/22/2021   Low immunoglobulin level    Multiple myeloma in remission (HCC) 05/14/2021   Neuropathy 04/02/2021    Frequent PVCs 12/04/2020   Coronary artery disease involving native coronary artery of native heart 04/26/2020   Hypokalemia 03/11/2020   Physical deconditioning 02/07/2020   Impaired nutrition 02/07/2020   S/P autologous bone marrow transplantation (HCC) 09/10/2019   Hypocalcemia 08/28/2019   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   Hyperlipidemia associated with type 2 diabetes mellitus (HCC) 11/25/2018   Hypomagnesemia 08/20/2018   Goals of care, counseling/discussion 08/20/2018   B12 deficiency 08/20/2018   Thrombocytopenia (HCC) 02/09/2018   Benign essential HTN 08/21/2017   Carotid artery stenosis, asymptomatic, bilateral 08/06/2017   Thyroid  nodule 08/06/2017   Iron  deficiency anemia due to chronic blood loss 05/13/2017   Spinal stenosis of lumbar region with neurogenic claudication 04/09/2017   Long term prescription opiate use 09/07/2016   Pain medication agreement signed 07/08/2016   Spondylosis of lumbar region without myelopathy or radiculopathy 07/08/2016   Nonrheumatic aortic valve stenosis 06/05/2016   Environmental and seasonal allergies 04/19/2016   Insomnia 04/19/2016   Asthma 04/19/2016   Aortic regurgitation 04/19/2016   Type II diabetes mellitus with complication (HCC) 04/12/2016   Depression, major, single episode, in partial remission (HCC) 04/12/2016   Irritable bowel syndrome (IBS) 04/12/2016   Hepatic steatosis 04/12/2016   Multiple myeloma in remission (HCC) 04/02/2016   History of autologous stem cell  transplant (HCC) 07/06/2015   History of stem cell transplant (HCC) 05/16/2015    Allergies  Allergen Reactions   Oxycodone-Acetaminophen  Anaphylaxis    Swelling and rash  acetaminophen  / oxycodone   Celebrex  [Celecoxib ] Diarrhea   Codeine     Plerixafor     In 2016 during ASCT collection patient developed fever to 103.60F and required hospitalization   Benadryl  [Diphenhydramine ] Palpitations   Morphine  Itching and Rash   Ondansetron  Diarrhea    Tylenol  [Acetaminophen ] Itching and Rash    Past Surgical History:  Procedure Laterality Date   ABDOMINAL HYSTERECTOMY     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   CARDIAC ELECTROPHYSIOLOGY MAPPING AND ABLATION     CARPAL TUNNEL RELEASE Bilateral    CHOLECYSTECTOMY  2008   COLONOSCOPY WITH PROPOFOL  N/A 05/07/2017   Procedure: COLONOSCOPY WITH PROPOFOL ;  Surgeon: Luke Salaam, MD;  Location: [facility] ENDOSCOPY;  Service: Gastroenterology;  Laterality: N/A;   COLONOSCOPY WITH PROPOFOL  N/A 12/19/2020   Procedure: COLONOSCOPY WITH PROPOFOL ;  Surgeon: Luke Salaam, MD;  Location: [facility] ENDOSCOPY;  Service: Gastroenterology;  Laterality: N/A;   ESOPHAGOGASTRODUODENOSCOPY (EGD) WITH PROPOFOL  N/A 05/07/2017   Procedure: ESOPHAGOGASTRODUODENOSCOPY (EGD) WITH PROPOFOL ;  Surgeon: Luke Salaam, MD;  Location: [facility] ENDOSCOPY;  Service: Gastroenterology;  Laterality: N/A;   FOOT SURGERY Bilateral    INCONTINENCE SURGERY  2009   INTERSTIM IMPLANT PLACEMENT     other     over active bladder   OTHER SURGICAL HISTORY     bladder stimulator    PARTIAL HYSTERECTOMY  03/1996   fibroids   PORTA CATH INSERTION N/A 03/10/2019   Procedure: PORTA CATH INSERTION;  Surgeon: Celso College, MD;  Location: [facility] INVASIVE CV LAB;  Service: Cardiovascular;  Laterality: N/A;   TONSILLECTOMY  2007   TRANSCATHETER AORTIC VALVE REPLACEMENT, TRANSFEMORAL  11/06/2023    Social History   Tobacco Use   Smoking status: Former    Current packs/day: 0.00    Average packs/day: 1 pack/day for 20.0 years (20.0 ttl pk-yrs)    Types: Cigarettes    Start date: 07/02/1971    Quit date: 07/02/1991    Years since quitting: 32.4   Smokeless tobacco: Never  Vaping Use   Vaping status: Never Used  Substance Use Topics   Alcohol use: Yes    Alcohol/week: 2.0 standard drinks of alcohol    Types: 2 Cans of beer per week   Drug use: No     Medication list has been reviewed and updated.  No outpatient medications have been marked as  taking for the 12/19/23 encounter (Orders Only) with Sheron Dixons, MD.       11/14/2023    1:17 PM 10/24/2023    2:35 PM 06/02/2023    3:38 PM 12/09/2022    9:50 AM  GAD 7 : Generalized Anxiety Score  Nervous, Anxious, on Edge 2 1 3 1   Control/stop worrying 1 1 3 2   Worry too much - different things 1 1 3 2   Trouble relaxing 1 0 3 2  Restless 0 0 3 1  Easily annoyed or irritable 0 0 1 1  Afraid - awful might happen 0 0 0 0  Total GAD 7 Score 5 3 16 9   Anxiety Difficulty Not difficult at all Not difficult at all Somewhat difficult Somewhat difficult       11/14/2023    1:17 PM 10/24/2023    2:35 PM 06/02/2023    3:38 PM  Depression screen PHQ 2/9  Decreased Interest 0  2 1  Down, Depressed, Hopeless 0 0 1  PHQ - 2 Score 0 2 2  Altered sleeping 1 1 3   Tired, decreased energy 3 3 3   Change in appetite 1 0 0  Feeling bad or failure about yourself  0 0 0  Trouble concentrating 2 2 0  Moving slowly or fidgety/restless 3 0 0  Suicidal thoughts 0 0 0  PHQ-9 Score 10 8 8   Difficult doing work/chores Somewhat difficult Not difficult at all Somewhat difficult    BP Readings from Last 3 Encounters:  12/18/23 110/77  12/10/23 (!) 141/70  12/04/23 (!) 118/56    Physical Exam  Wt Readings from Last 3 Encounters:  11/26/23 161 lb (73 kg)  11/14/23 166 lb (75.3 kg)  10/24/23 166 lb 6 oz (75.5 kg)    There were no vitals taken for this visit.  Assessment and Plan:  Problem List Items Addressed This Visit   None   No follow-ups on file.    Sheron Dixons, MD [facility] [place]

## 2023-12-22 NOTE — Telephone Encounter (Signed)
 Requested medications are due for refill today.  yes  Requested medications are on the active medications list.  yes  Last refill. 06/02/2023 #30 3 rf  Future visit scheduled.   no  Notes to clinic.  Refill not delegated.     Requested Prescriptions  Pending Prescriptions Disp Refills   diazepam  (VALIUM ) 5 MG tablet [Pharmacy Med Name: DIAZEPAM  5MG  TABLETS] 30 tablet     Sig: TAKE 1/2 TO 1 TABLET(2.5 TO 5 MG) BY MOUTH AT BEDTIME AS NEEDED FOR INSOMNIA     Not Delegated - Psychiatry: Anxiolytics/Hypnotics 2 Failed - 12/22/2023  4:55 PM      Failed - This refill cannot be delegated      Failed - Urine Drug Screen completed in last 360 days      Passed - Patient is not pregnant      Passed - Valid encounter within last 6 months    Recent Outpatient Visits           1 month ago S/P aortic valve replacement   [place] [facility], Leita DEL, MD   1 month ago Amaurosis fugax of left eye   [facility] at [facility], Leita DEL, MD       Future Appointments             In 1 month Justus, Leita DEL, MD [facility] at [facility]

## 2023-12-23 NOTE — Telephone Encounter (Signed)
 Please review.  KP

## 2023-12-24 ENCOUNTER — Inpatient Hospital Stay

## 2023-12-24 MED FILL — Promethazine HCl Inj 25 MG/ML: INTRAMUSCULAR | Qty: 0.5 | Status: AC

## 2023-12-25 ENCOUNTER — Inpatient Hospital Stay

## 2023-12-25 VITALS — BP 152/75 | HR 60 | Temp 96.7°F

## 2023-12-25 DIAGNOSIS — C9001 Multiple myeloma in remission: Secondary | ICD-10-CM

## 2023-12-25 LAB — MAGNESIUM: Magnesium: 1.3 mg/dL — ABNORMAL LOW (ref 1.7–2.4)

## 2023-12-25 MED ORDER — SODIUM CHLORIDE 0.9 % IV SOLN
INTRAVENOUS | Status: DC
  Filled 2023-12-25: qty 250

## 2023-12-25 MED ORDER — MAGNESIUM SULFATE 2 GM/50ML IV SOLN
2.0000 g | Freq: Once | INTRAVENOUS | Status: AC
  Administered 2023-12-25: 2 g via INTRAVENOUS
  Filled 2023-12-25: qty 50

## 2023-12-25 MED ORDER — HEPARIN SOD (PORK) LOCK FLUSH 100 UNIT/ML IV SOLN
500.0000 [IU] | Freq: Once | INTRAVENOUS | Status: AC
  Administered 2023-12-25: 500 [IU] via INTRAVENOUS
  Filled 2023-12-25: qty 5

## 2023-12-25 MED ORDER — MAGNESIUM SULFATE 4 GM/100ML IV SOLN
4.0000 g | Freq: Once | INTRAVENOUS | Status: AC
  Administered 2023-12-25: 4 g via INTRAVENOUS
  Filled 2023-12-25: qty 100

## 2023-12-25 MED ORDER — SODIUM CHLORIDE 0.9 % IV SOLN
12.5000 mg | Freq: Once | INTRAVENOUS | Status: AC
  Administered 2023-12-25: 12.5 mg via INTRAVENOUS
  Filled 2023-12-25: qty 0.5

## 2023-12-25 NOTE — Patient Instructions (Signed)
 Magnesium Sulfate Injection What is this medication? MAGNESIUM SULFATE (mag NEE zee um SUL fate) prevents and treats low levels of magnesium in your body. It may also be used to prevent and treat seizures during pregnancy in people with high blood pressure disorders, such as preeclampsia or eclampsia. Magnesium plays an important role in maintaining the health of your muscles and nervous system. This medicine may be used for other purposes; ask your health care provider or pharmacist if you have questions. What should I tell my care team before I take this medication? They need to know if you have any of these conditions: Heart disease History of irregular heart beat Kidney disease An unusual or allergic reaction to magnesium sulfate, medications, foods, dyes, or preservatives Pregnant or trying to get pregnant Breast-feeding How should I use this medication? This medication is for infusion into a vein. It is given in a hospital or clinic setting. Talk to your care team about the use of this medication in children. While this medication may be prescribed for selected conditions, precautions do apply. Overdosage: If you think you have taken too much of this medicine contact a poison control center or emergency room at once. NOTE: This medicine is only for you. Do not share this medicine with others. What if I miss a dose? This does not apply. What may interact with this medication? Certain medications for anxiety or sleep Certain medications for seizures, such phenobarbital Digoxin Medications that relax muscles for surgery Narcotic medications for pain This list may not describe all possible interactions. Give your health care provider a list of all the medicines, herbs, non-prescription drugs, or dietary supplements you use. Also tell them if you smoke, drink alcohol, or use illegal drugs. Some items may interact with your medicine. What should I watch for while using this  medication? Your condition will be monitored carefully while you are receiving this medication. You may need blood work done while you are receiving this medication. What side effects may I notice from receiving this medication? Side effects that you should report to your care team as soon as possible: Allergic reactions--skin rash, itching, hives, swelling of the face, lips, tongue, or throat High magnesium level--confusion, drowsiness, facial flushing, redness, sweating, muscle weakness, fast or irregular heartbeat, trouble breathing Low blood pressure--dizziness, feeling faint or lightheaded, blurry vision Side effects that usually do not require medical attention (report to your care team if they continue or are bothersome): Headache Nausea This list may not describe all possible side effects. Call your doctor for medical advice about side effects. You may report side effects to FDA at 1-800-FDA-1088. Where should I keep my medication? This medication is given in a hospital or clinic and will not be stored at home. NOTE: This sheet is a summary. It may not cover all possible information. If you have questions about this medicine, talk to your doctor, pharmacist, or health care provider.  2024 Elsevier/Gold Standard (2021-02-28 00:00:00)

## 2023-12-31 ENCOUNTER — Inpatient Hospital Stay: Attending: Oncology

## 2023-12-31 ENCOUNTER — Inpatient Hospital Stay

## 2024-01-01 ENCOUNTER — Inpatient Hospital Stay

## 2024-01-01 ENCOUNTER — Inpatient Hospital Stay: Attending: Oncology

## 2024-01-01 DIAGNOSIS — C9001 Multiple myeloma in remission: Secondary | ICD-10-CM | POA: Diagnosis present

## 2024-01-01 DIAGNOSIS — I130 Hypertensive heart and chronic kidney disease with heart failure and stage 1 through stage 4 chronic kidney disease, or unspecified chronic kidney disease: Secondary | ICD-10-CM | POA: Insufficient documentation

## 2024-01-01 DIAGNOSIS — M109 Gout, unspecified: Secondary | ICD-10-CM | POA: Diagnosis not present

## 2024-01-01 DIAGNOSIS — Z803 Family history of malignant neoplasm of breast: Secondary | ICD-10-CM | POA: Insufficient documentation

## 2024-01-01 DIAGNOSIS — K760 Fatty (change of) liver, not elsewhere classified: Secondary | ICD-10-CM | POA: Diagnosis not present

## 2024-01-01 DIAGNOSIS — I429 Cardiomyopathy, unspecified: Secondary | ICD-10-CM | POA: Insufficient documentation

## 2024-01-01 DIAGNOSIS — D500 Iron deficiency anemia secondary to blood loss (chronic): Secondary | ICD-10-CM | POA: Insufficient documentation

## 2024-01-01 DIAGNOSIS — Z95828 Presence of other vascular implants and grafts: Secondary | ICD-10-CM

## 2024-01-01 DIAGNOSIS — F32A Depression, unspecified: Secondary | ICD-10-CM | POA: Insufficient documentation

## 2024-01-01 DIAGNOSIS — I5032 Chronic diastolic (congestive) heart failure: Secondary | ICD-10-CM | POA: Diagnosis not present

## 2024-01-01 DIAGNOSIS — N184 Chronic kidney disease, stage 4 (severe): Secondary | ICD-10-CM | POA: Insufficient documentation

## 2024-01-01 DIAGNOSIS — Z888 Allergy status to other drugs, medicaments and biological substances status: Secondary | ICD-10-CM | POA: Diagnosis not present

## 2024-01-01 DIAGNOSIS — Z90710 Acquired absence of both cervix and uterus: Secondary | ICD-10-CM | POA: Diagnosis not present

## 2024-01-01 DIAGNOSIS — E1122 Type 2 diabetes mellitus with diabetic chronic kidney disease: Secondary | ICD-10-CM | POA: Insufficient documentation

## 2024-01-01 DIAGNOSIS — E538 Deficiency of other specified B group vitamins: Secondary | ICD-10-CM | POA: Insufficient documentation

## 2024-01-01 DIAGNOSIS — Z9049 Acquired absence of other specified parts of digestive tract: Secondary | ICD-10-CM | POA: Diagnosis not present

## 2024-01-01 DIAGNOSIS — Z9221 Personal history of antineoplastic chemotherapy: Secondary | ICD-10-CM | POA: Insufficient documentation

## 2024-01-01 DIAGNOSIS — Z822 Family history of deafness and hearing loss: Secondary | ICD-10-CM | POA: Insufficient documentation

## 2024-01-01 DIAGNOSIS — Z832 Family history of diseases of the blood and blood-forming organs and certain disorders involving the immune mechanism: Secondary | ICD-10-CM | POA: Insufficient documentation

## 2024-01-01 DIAGNOSIS — Z808 Family history of malignant neoplasm of other organs or systems: Secondary | ICD-10-CM | POA: Insufficient documentation

## 2024-01-01 DIAGNOSIS — Z79899 Other long term (current) drug therapy: Secondary | ICD-10-CM | POA: Diagnosis not present

## 2024-01-01 DIAGNOSIS — Z87891 Personal history of nicotine dependence: Secondary | ICD-10-CM | POA: Diagnosis not present

## 2024-01-01 DIAGNOSIS — Z8249 Family history of ischemic heart disease and other diseases of the circulatory system: Secondary | ICD-10-CM | POA: Insufficient documentation

## 2024-01-01 DIAGNOSIS — K529 Noninfective gastroenteritis and colitis, unspecified: Secondary | ICD-10-CM | POA: Diagnosis not present

## 2024-01-01 DIAGNOSIS — Z841 Family history of disorders of kidney and ureter: Secondary | ICD-10-CM | POA: Insufficient documentation

## 2024-01-01 DIAGNOSIS — Z833 Family history of diabetes mellitus: Secondary | ICD-10-CM | POA: Diagnosis not present

## 2024-01-01 DIAGNOSIS — Z801 Family history of malignant neoplasm of trachea, bronchus and lung: Secondary | ICD-10-CM | POA: Insufficient documentation

## 2024-01-01 DIAGNOSIS — I252 Old myocardial infarction: Secondary | ICD-10-CM | POA: Insufficient documentation

## 2024-01-01 DIAGNOSIS — Z885 Allergy status to narcotic agent status: Secondary | ICD-10-CM | POA: Diagnosis not present

## 2024-01-01 DIAGNOSIS — Z886 Allergy status to analgesic agent status: Secondary | ICD-10-CM | POA: Diagnosis not present

## 2024-01-01 DIAGNOSIS — Z800 Family history of malignant neoplasm of digestive organs: Secondary | ICD-10-CM | POA: Insufficient documentation

## 2024-01-01 LAB — MAGNESIUM: Magnesium: 1.4 mg/dL — ABNORMAL LOW (ref 1.7–2.4)

## 2024-01-01 MED ORDER — SODIUM CHLORIDE 0.9% FLUSH
10.0000 mL | Freq: Once | INTRAVENOUS | Status: AC
  Administered 2024-01-01: 10 mL via INTRAVENOUS
  Filled 2024-01-01: qty 10

## 2024-01-01 MED ORDER — SODIUM CHLORIDE 0.9 % IV SOLN
12.5000 mg | Freq: Once | INTRAVENOUS | Status: AC
  Administered 2024-01-01: 12.5 mg via INTRAVENOUS
  Filled 2024-01-01: qty 0.5

## 2024-01-01 MED ORDER — MAGNESIUM SULFATE 4 GM/100ML IV SOLN
4.0000 g | Freq: Once | INTRAVENOUS | Status: AC
  Administered 2024-01-01: 4 g via INTRAVENOUS
  Filled 2024-01-01: qty 100

## 2024-01-01 MED ORDER — SODIUM CHLORIDE 0.9 % IV SOLN
INTRAVENOUS | Status: DC
  Filled 2024-01-01: qty 250

## 2024-01-01 MED ORDER — HEPARIN SOD (PORK) LOCK FLUSH 100 UNIT/ML IV SOLN
500.0000 [IU] | Freq: Once | INTRAVENOUS | Status: AC
  Administered 2024-01-01: 500 [IU] via INTRAVENOUS
  Filled 2024-01-01: qty 5

## 2024-01-01 NOTE — Patient Instructions (Signed)
 Magnesium Sulfate Injection What is this medication? MAGNESIUM SULFATE (mag NEE zee um SUL fate) prevents and treats low levels of magnesium in your body. It may also be used to prevent and treat seizures during pregnancy in people with high blood pressure disorders, such as preeclampsia or eclampsia. Magnesium plays an important role in maintaining the health of your muscles and nervous system. This medicine may be used for other purposes; ask your health care provider or pharmacist if you have questions. What should I tell my care team before I take this medication? They need to know if you have any of these conditions: Heart disease History of irregular heart beat Kidney disease An unusual or allergic reaction to magnesium sulfate, medications, foods, dyes, or preservatives Pregnant or trying to get pregnant Breast-feeding How should I use this medication? This medication is for infusion into a vein. It is given in a hospital or clinic setting. Talk to your care team about the use of this medication in children. While this medication may be prescribed for selected conditions, precautions do apply. Overdosage: If you think you have taken too much of this medicine contact a poison control center or emergency room at once. NOTE: This medicine is only for you. Do not share this medicine with others. What if I miss a dose? This does not apply. What may interact with this medication? Certain medications for anxiety or sleep Certain medications for seizures, such phenobarbital Digoxin Medications that relax muscles for surgery Narcotic medications for pain This list may not describe all possible interactions. Give your health care provider a list of all the medicines, herbs, non-prescription drugs, or dietary supplements you use. Also tell them if you smoke, drink alcohol, or use illegal drugs. Some items may interact with your medicine. What should I watch for while using this  medication? Your condition will be monitored carefully while you are receiving this medication. You may need blood work done while you are receiving this medication. What side effects may I notice from receiving this medication? Side effects that you should report to your care team as soon as possible: Allergic reactions--skin rash, itching, hives, swelling of the face, lips, tongue, or throat High magnesium level--confusion, drowsiness, facial flushing, redness, sweating, muscle weakness, fast or irregular heartbeat, trouble breathing Low blood pressure--dizziness, feeling faint or lightheaded, blurry vision Side effects that usually do not require medical attention (report to your care team if they continue or are bothersome): Headache Nausea This list may not describe all possible side effects. Call your doctor for medical advice about side effects. You may report side effects to FDA at 1-800-FDA-1088. Where should I keep my medication? This medication is given in a hospital or clinic and will not be stored at home. NOTE: This sheet is a summary. It may not cover all possible information. If you have questions about this medicine, talk to your doctor, pharmacist, or health care provider.  2024 Elsevier/Gold Standard (2021-02-28 00:00:00)

## 2024-01-07 ENCOUNTER — Inpatient Hospital Stay (HOSPITAL_BASED_OUTPATIENT_CLINIC_OR_DEPARTMENT_OTHER): Admitting: Oncology

## 2024-01-07 ENCOUNTER — Inpatient Hospital Stay

## 2024-01-07 ENCOUNTER — Other Ambulatory Visit: Payer: Self-pay | Admitting: Internal Medicine

## 2024-01-07 ENCOUNTER — Encounter: Payer: Self-pay | Admitting: Oncology

## 2024-01-07 VITALS — BP 139/72 | HR 60 | Temp 97.8°F | Resp 18 | Ht 63.0 in | Wt 162.0 lb

## 2024-01-07 DIAGNOSIS — M461 Sacroiliitis, not elsewhere classified: Secondary | ICD-10-CM | POA: Insufficient documentation

## 2024-01-07 DIAGNOSIS — G629 Polyneuropathy, unspecified: Secondary | ICD-10-CM

## 2024-01-07 DIAGNOSIS — E538 Deficiency of other specified B group vitamins: Secondary | ICD-10-CM

## 2024-01-07 DIAGNOSIS — D500 Iron deficiency anemia secondary to blood loss (chronic): Secondary | ICD-10-CM

## 2024-01-07 DIAGNOSIS — Z9484 Stem cells transplant status: Secondary | ICD-10-CM | POA: Diagnosis not present

## 2024-01-07 DIAGNOSIS — N184 Chronic kidney disease, stage 4 (severe): Secondary | ICD-10-CM

## 2024-01-07 DIAGNOSIS — K219 Gastro-esophageal reflux disease without esophagitis: Secondary | ICD-10-CM

## 2024-01-07 DIAGNOSIS — C9001 Multiple myeloma in remission: Secondary | ICD-10-CM | POA: Diagnosis not present

## 2024-01-07 LAB — IRON AND TIBC
Iron: 59 ug/dL (ref 28–170)
Saturation Ratios: 16 % (ref 10.4–31.8)
TIBC: 368 ug/dL (ref 250–450)
UIBC: 309 ug/dL

## 2024-01-07 LAB — CMP (CANCER CENTER ONLY)
ALT: 39 U/L (ref 0–44)
AST: 41 U/L (ref 15–41)
Albumin: 4.1 g/dL (ref 3.5–5.0)
Alkaline Phosphatase: 87 U/L (ref 38–126)
Anion gap: 7 (ref 5–15)
BUN: 22 mg/dL (ref 8–23)
CO2: 22 mmol/L (ref 22–32)
Calcium: 9 mg/dL (ref 8.9–10.3)
Chloride: 106 mmol/L (ref 98–111)
Creatinine: 1.23 mg/dL — ABNORMAL HIGH (ref 0.44–1.00)
GFR, Estimated: 48 mL/min — ABNORMAL LOW (ref >60)
Glucose, Bld: 147 mg/dL — ABNORMAL HIGH (ref 70–99)
Potassium: 4.1 mmol/L (ref 3.5–5.1)
Sodium: 135 mmol/L (ref 135–145)
Total Bilirubin: 0.4 mg/dL (ref 0.0–1.2)
Total Protein: 7.1 g/dL (ref 6.5–8.1)

## 2024-01-07 LAB — CBC WITH DIFFERENTIAL (CANCER CENTER ONLY)
Abs Immature Granulocytes: 0.02 K/uL (ref 0.00–0.07)
Basophils Absolute: 0 K/uL (ref 0.0–0.1)
Basophils Relative: 0 %
Eosinophils Absolute: 0.1 K/uL (ref 0.0–0.5)
Eosinophils Relative: 2 %
HCT: 33.7 % — ABNORMAL LOW (ref 36.0–46.0)
Hemoglobin: 11.4 g/dL — ABNORMAL LOW (ref 12.0–15.0)
Immature Granulocytes: 0 %
Lymphocytes Relative: 22 %
Lymphs Abs: 1.1 K/uL (ref 0.7–4.0)
MCH: 29.2 pg (ref 26.0–34.0)
MCHC: 33.8 g/dL (ref 30.0–36.0)
MCV: 86.2 fL (ref 80.0–100.0)
Monocytes Absolute: 0.4 K/uL (ref 0.1–1.0)
Monocytes Relative: 8 %
Neutro Abs: 3.2 K/uL (ref 1.7–7.7)
Neutrophils Relative %: 68 %
Platelet Count: 89 K/uL — ABNORMAL LOW (ref 150–400)
RBC: 3.91 MIL/uL (ref 3.87–5.11)
RDW: 13.4 % (ref 11.5–15.5)
WBC Count: 4.7 K/uL (ref 4.0–10.5)
nRBC: 0 % (ref 0.0–0.2)

## 2024-01-07 LAB — FERRITIN: Ferritin: 46 ng/mL (ref 11–307)

## 2024-01-07 LAB — MAGNESIUM: Magnesium: 1.5 mg/dL — ABNORMAL LOW (ref 1.7–2.4)

## 2024-01-07 MED ORDER — SODIUM CHLORIDE 0.9 % IV SOLN
INTRAVENOUS | Status: DC
  Filled 2024-01-07: qty 250

## 2024-01-07 MED ORDER — HEPARIN SOD (PORK) LOCK FLUSH 100 UNIT/ML IV SOLN
500.0000 [IU] | Freq: Once | INTRAVENOUS | Status: DC | PRN
  Filled 2024-01-07: qty 5

## 2024-01-07 MED ORDER — SODIUM CHLORIDE 0.9 % IV SOLN
12.5000 mg | Freq: Once | INTRAVENOUS | Status: AC
  Administered 2024-01-07: 12.5 mg via INTRAVENOUS
  Filled 2024-01-07: qty 0.5

## 2024-01-07 MED ORDER — CYANOCOBALAMIN 1000 MCG/ML IJ SOLN
1000.0000 ug | Freq: Once | INTRAMUSCULAR | Status: AC
  Administered 2024-01-07: 1000 ug via INTRAMUSCULAR
  Filled 2024-01-07: qty 1

## 2024-01-07 MED ORDER — MAGNESIUM SULFATE 4 GM/100ML IV SOLN
4.0000 g | Freq: Once | INTRAVENOUS | Status: AC
  Administered 2024-01-07: 4 g via INTRAVENOUS
  Filled 2024-01-07: qty 100

## 2024-01-07 NOTE — Assessment & Plan Note (Signed)
 Encourage oral hydration and avoid nephrotoxins.

## 2024-01-07 NOTE — Assessment & Plan Note (Signed)
 Grade 2 chemotherapy-induced neuropathy, Previously on Lyrica and nortriptyline-not effective Neurology recommend Tramadol and may consider higher dose of  Nortriptyline in the future.  She prefer to be off treatments for now, symptom is stable. Sarah [name]

## 2024-01-07 NOTE — Assessment & Plan Note (Signed)
 continue B12 injections Q 6 weeks.

## 2024-01-07 NOTE — Progress Notes (Signed)
 Hematology/Oncology Progress note [phone] [phone]     Chief Complaint: Sarah [name] is a 68 y.o. 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  Light chain ratio is normal. 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 Repeat immunofixation negative. . Labs reviewed and discussed with patient, stable M protein and stable light chain ratio Off Daratumumab  due to frequent infections  Continue acyclovir  prophylaxis.  B12 deficiency continue B12 injections Q 6 weeks.   History of stem cell transplant (HCC) -[facility]- Post transplant RN coordinator [phone]).  Hypomagnesemia #Chronic hypomagnesemia proceed with  IV magnesium  today.  Continue weekly magnesium  +/- IV magnesium .     Iron  deficiency anemia due to chronic blood loss Lab Results  Component Value Date   HGB 11.4 (L) 01/07/2024   TIBC 335 06/05/2023   IRONPCTSAT 7 (L) 06/05/2023   FERRITIN 73 06/05/2023    Hb is stable   Neuropathy Grade 2 chemotherapy-induced neuropathy, Previously on Lyrica  and nortriptyline-not effective Neurology recommend Tramadol  and may consider higher dose of  Nortriptyline in the future.  She prefer to be off treatments for now, symptom is stable. .    Stage 4 chronic kidney disease (HCC) Encourage oral hydration and avoid nephrotoxins.    SI (sacroiliac) joint inflammation (HCC) Recommend Tylenol  650mg  Q6h PRN, physical therapy    Orders Placed This Encounter  Procedures   CMP (Cancer Center only)    Standing Status:   Future    Expected  Date:   02/18/2024    Expiration Date:   05/18/2024   CBC with Differential (Cancer Center Only)    Standing Status:   Future    Expected Date:   02/18/2024    Expiration Date:   05/18/2024   Multiple Myeloma Panel (SPEP&IFE w/QIG)    Standing Status:   Future    Expected Date:   02/18/2024    Expiration Date:   05/18/2024   Kappa/lambda light chains    Standing Status:   Future    Expected Date:   02/18/2024    Expiration Date:   05/18/2024   Vitamin B12    Standing Status:   Future    Expected Date:   02/18/2024    Expiration Date:   05/18/2024    Follow up  Lab Mag weekly x5  + IV mag.  6 weeks flex lab MD IV mag,  same labs  All questions were answered. The patient knows to call the clinic with any problems, questions or concerns.  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   Initial bone marrow revealed 80% plasma cells.  Lambda free light  chains were 1340.  She had nephrotic range proteinuria.  She initially underwent induction with RVD.  Revlimid  maintenance was discontinued on 01/21/2017 secondary to intolerance.     07/19/2019 Bone marrow aspirate and biopsy on  revealed a normocellular marrow with but increased lambda-restricted plasma cells (9% aspirate, 40% CD138 immunohistochemistry).  Findings were consistent with recurrent plasma cell myeloma.  Flow cytometry revealed no monoclonal B-cell or phenotypically aberrant T-cell population.  Cytogenetics were 52, XX (normal).  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    Lambda light chains have been followed: 22.2 (ratio 0.56) on 07/03/2017, 30.8 (ratio 0.78) on 09/02/2017, 36.9 (ratio 0.40) on 10/21/2017, 37.4 (ratio 0.41) on 12/16/2017, 70.7(ratio 0.31)  on 02/17/2018, 64.2 (ratio 0.27) on 04/07/2018, 78.9 (ratio 0.18) on 05/26/2018, 128.8 (ratio 0.17) on 08/06/2018, 181.5 (ratio 0.13) on 10/08/2018, 130.9 (ratio 0.13) on 10/20/2018, 160.7 (ratio 0.10) on 12/09/2018, 236.6 (ratio 0.07) on 02/01/2019, 363.6 (ratio 0.04) on 03/22/2019, 404.8 (ratio 0.04) on 04/05/2019, 420.7 (ratio 0.03) on 05/24/2019, 573.4 (ratio 0.03) on 06/23/2019, 451.05 (ratio 0.02) on 08/20/2019, 47.2 (ratio 0.13) on 10/25/2019, 22.4 (ratio 0.21) on 11/25/2019, 16.5 (ratio 0.33) on 01/10/2020, 14.6 (ratio 0.34) on 02/07/2020, 13.1 (ratio 0.31) on 03/07/2020, 10.1 (ratio 0.38) on 04/10/2020, and 9.5 (ratio 0.21) on 06/19/2020.   24 hour UPEP on 06/03/2019 revealed kappa free light chains 95.76, lambda free light chains 1,260.71, and ratio 0.08.  24 hour UPEP on 08/23/2019 revealed total protein of 782 mg/24 hrs with lambda free light chains 1,084.16 mg/L and ratio of 0.10 (1.03-31.76).  M spike in urine was 46.1% (361 mg/24 hrs).    Bone survey on 04/08/2016 and 05/28/2017 revealed no definite lytic lesion seen in the visualized skeleton.  Bone survey on 11/19/2018 revealed no suspicious lucent lesions and no acute bony abnormality.  PET scan on 07/12/2019 revealed no focal metabolic activity to suggest active myeloma within the skeleton. There were no lytic lesions identified on the CT portion of the exam or soft tissue plasmacytomas. There was no evidence of multiple myeloma.    Pretreatment RBC phenotype on 09/23/2019 was positive for C, e, DUFFY B, KIDD B, M, S, and s antigen; negative for c, E, KELL, DUFFY A, KIDD A, and N antigen.    09/27/2019 - 10/25/2019; 12/09/2019 - 03/13/2020 6 cycles of  daratumumab  and hyaluronidase -fihj, Pomalyst , and Decadron  (DPd) .  Cycle #1 was complicated by fever and neutropenia requiring admission.  Cycle #2 was complicated by pneumonia requiring admission.  Cycle #6 was complicated with an ER evaluation for an elevated lactic acid.   05/10/2020 second autologous stem cell transplant at [facility]   She underwent conditioning with melphalan 140 mg/m2.    She is s/p week #3 Velcade  maintenance (began 08/31/2020 - 09/28/2020).  She has no increase in neuropathy.  She has severe aortic stenosis.  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 Cardiology is following for a possible TAVR in the future.  Echo on 07/05/2020 revealed moderate to severe aortic stenosis with an EF of 50-55%.  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 Zometa  was discontinued in 01/2017.  She has chronic nausea on Phenergan .     B12 deficiency.  B12 was 254 on 04/09/2017, 295 on 08/20/2018, and 391 on 10/08/2018.  She was on oral B12.  She received B12 monthly (last 09/14/2020).  Folate was 12.1 on 01/10/2020.    She has  iron  deficiency.  Ferritin was 32 on 07/01/2019.  She received Venofer  on 07/15/2019 and 07/22/2019.   She has hypogammaglobulinemia.  IgG was 245 on 11/25/2019.  She received monthly IVIG (07/22/021 - 03/22/2020).  She received IVIG 400 mg/kg on 01/20/2020, 200 mg/kg on 02/17/2020, and 300 mg/kg on 03/22/2020.  IVIG on 01/20/2020 was complicated by acute renal failure. IgG trough level was 418 on 02/17/2020.  10/15/2019 - 10/20/2019 She was admitted to [facility] with fever and neutropenia.  Cultures were negative.  CXR was negative. She received broad spectrum antibiotics and daily Granix .  She received IVF for acute renal insufficiency due to diarrhea and dehydration.  Creatine was 1.66 on admission and 1.12 on discharge.    03/01/2020 - 03/05/2020 admitted to [facility] from with fever  and neutropenia. CXR revealed no active cardiopulmonary disease. Chest CT with contrast revealed no acute intrathoracic pathology. There were findings which could be suggestive of prior granulomatous disease. She was treated with Cefepime  and Vancomycin , then switched to ciprofloxacin  on 03/03/2020.   05/08/2020 - 12/05/2021The patient was admitted to [facility] from  for autologous bone marrow transplant.   She received conditioning with melphalan 140 mg/m2.  Bone marrow transplant was on 05/10/2020. The patient developed fevers daily from 05/19/2020 - 05/24/2020. Blood cultures were + for strept sanguis bacteremia.  She was treated cefepime , vancomycin , and solumedrol for possible engraftment syndrome. Cefepime  was switched to ceftriaxone ; she completed 2 weeks of ceftriaxone  on 06/03/2020. She had chemotherapy induced diarrhea.  She experienced urinary retention.  Feb 2022 patient has been on maintenance Velcade  2 mg every 2 weeks.  Chronic diarrhea seen by gastroenterology Dr. Therisa and was recommended to avoid artificial sugars in her diet including Diet Coke and coffee mate.  She feels some improvement of her diarrhea frequency since the dietary modification.  GI profile PCR negative, C. difficile toxin A+ B negative, fecal calprotectin 77 12/19/2020 colonoscopy showed 2 subcentimeter polyps in the ascending colon, resected and removed.  Pathology showed tubular adenoma.  No malignancy.  Normal mucosa in the entire examined colon.  Biopsied, pathology showed colonic mucosa with no significant pathological alteration.  Negative for active inflammation and features of chronicity.  Negative for microscopic colitis, dysplasia, and malignanc  Diabetes, patient has discontinued metformin  due to diarrhea and started on glipizide  2.5 mg   03/05/2021-03/09/2021 Patient was hospitalized due to fever and chills, nonproductive cough, shortness of breath. X-ray showed right lower lobe atelectasis versus pneumonia.   Blood cultures negative.  Urine culture showed 60,000 colonies of Enterococcus facialis.  Patient received antibiotics. Patient also had abdominal pelvis CT scan for work-up of intermittent lower abdomen pain.No acute abnormality.   05/14/21 last dose of Velcade  maintenance.  establish care with neurology Dr. Lane Hussar and her neuropathy regimen has been switched from gabapentin  to Lyrica . 05/29/2021 patient got influenza   neuropathy medication has been switched from gabapentin /Lyrica  to nortriptyline.  08/18/2021 Hospitalized due to pneumonia from metapnuemovirus, hospitalization was complicated with NSTEMI. She received heparin  gtt.  Treated with antibiotics for coverage of pneumonia,  diuretics for pulmonary edema. She received IVIG 400mg /kg x 1 for hypoglobulinemia. Discharged on 08/25/21 with Augmentin  and Zithromax .   May 2023, Daratumumab  being held for [facility] infectious disease physician Dr. Lorrene due to frequent infection.  Diagnosed with CHF [HFrEF] -NYHA class II-III, she follows up with cardiology, started on entresto   hospitalization due to CHF exacerbation, community acquired pneumonia.   Presented to emergency room on 07/01/2022 Respiratory panel RT-PCR negative for COVID-19, influenza  and RSV.  + CHF [nonischemic cardiomyopathy], aortic stenosis, follows up with cardiology. 11/06/2023 s/p TAVR for aortic valve stenosis.  INTERVAL HISTORY Sarah [name] is a 68 y.o. female who has above history reviewed by me today presents for follow up visit for management of multiple myeloma.  Patient has been on weekly magnesium  infusion as needed hypomagnesemia + new onset lower  back and buttocks, radiating to the groin area No recent injury. She had a chiropractor session yesterday.   Past Medical History:  Diagnosis Date   Anemia    Anxiety    Aortic stenosis    a. 05/2020 Echo: EF 45-50%, sev AS - seen by TAVR team @ [facility] - CTA sugg of tricuspid valve w/ fusing of 2  leaflets-TAVR deferred in setting of acute infxn; b. 07/2020 Echo: EF 50-55%, mod-sev AS; c. 12/2020 Echo: EF>55%. Mod-sev paradoxical low-flow low-gradient AS; d. 08/2021 Echo: EF 35-40%, severe AS, triv AI, mild MR.   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 Variable EF over time; b. 08/2017 Echo: EF 40%; b. 03/2020 Echo: EF 55-60%; c. 05/2020 Echo: EF 45-50%; d. 07/2020 Echo: EF 50-55%; e. 12/2020 Echo: EF>55%; f. 08/2021 Echo: EF 35-40%.   Chronic heart failure with preserved ejection fraction (HFpEF) (HCC)    a. Variable EF over time; b. 08/2017 Echo: EF 40%; b. 03/2020 Echo: EF 55-60%; c. 05/2020 Echo: EF 45-50%; d. 07/2020 Echo: EF 50-55%; e. 12/2020 Echo: EF>55%; f. 08/2021 Echo: EF 35-40%, no RV, mildly dil LA, mild MR, triv AI, severe AS.   Chronic pain syndrome 07/08/2016   CKD (chronic kidney disease) stage 3, GFR 30-59 ml/min (HCC)    Depression    Diabetes mellitus (HCC)    Dizziness    Fatty liver    Frequent falls    GERD (gastroesophageal reflux disease)    Gout    Heart murmur    History of blood transfusion    History of bone marrow transplant (HCC)    History of uterine fibroid    Hx of cardiac catheterization    a. 01/2016 Cath ([place]  - after abnl nuc): Nl cors.   Hypertension    Hypomagnesemia    IDA (iron  deficiency anemia)    Multiple myeloma (HCC)    NSTEMI (non-ST elevated myocardial infarction) (HCC) 08/21/2021   Personal history of chemotherapy    Pleurisy 04/09/2022   PSVT (paroxysmal supraventricular tachycardia) (HCC)    a. S/p RFCA 1999 ([place] ).   PVC's (premature ventricular contractions)    a. Well-managed w/ bisoprolol  in outpt setting.   Renal cyst     Past Surgical History:  Procedure Laterality Date   ABDOMINAL HYSTERECTOMY     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   CARDIAC ELECTROPHYSIOLOGY MAPPING AND ABLATION     CARPAL  TUNNEL RELEASE Bilateral    CHOLECYSTECTOMY  2008   COLONOSCOPY WITH PROPOFOL  N/A 05/07/2017   Procedure: COLONOSCOPY WITH PROPOFOL ;  Surgeon: Therisa Bi, MD;  Location: [facility] ENDOSCOPY;  Service: Gastroenterology;  Laterality: N/A;   COLONOSCOPY WITH PROPOFOL  N/A 12/19/2020   Procedure: COLONOSCOPY WITH PROPOFOL ;  Surgeon: Therisa Bi, MD;  Location: [facility] ENDOSCOPY;  Service: Gastroenterology;  Laterality: N/A;   ESOPHAGOGASTRODUODENOSCOPY (EGD) WITH PROPOFOL  N/A 05/07/2017   Procedure: ESOPHAGOGASTRODUODENOSCOPY (EGD) WITH PROPOFOL ;  Surgeon: Therisa Bi, MD;  Location: [facility] ENDOSCOPY;  Service: Gastroenterology;  Laterality: N/A;  FOOT SURGERY Bilateral    INCONTINENCE SURGERY  2009   INTERSTIM IMPLANT PLACEMENT     other     over active bladder   OTHER SURGICAL HISTORY     bladder stimulator    PARTIAL HYSTERECTOMY  03/1996   fibroids   PORTA CATH INSERTION N/A 03/10/2019   Procedure: PORTA CATH INSERTION;  Surgeon: Marea Selinda RAMAN, MD;  Location: [facility] INVASIVE CV LAB;  Service: Cardiovascular;  Laterality: N/A;   TONSILLECTOMY  2007   TRANSCATHETER AORTIC VALVE REPLACEMENT, TRANSFEMORAL  11/06/2023    Family History  Problem Relation Age of Onset   Colon cancer Father    Renal Disease Father    Diabetes Mellitus II Father    Melanoma Paternal Grandmother    Breast cancer Maternal Aunt 45   Anemia Mother    Heart disease Mother    Heart failure Mother    Renal Disease Mother    Congestive Heart Failure Mother    Heart disease Maternal Uncle    Throat cancer Maternal Uncle    Lung cancer Maternal Uncle    Liver disease Maternal Uncle    Heart failure Maternal Uncle    Hearing loss Son 52       Suicide     Social History:  reports that she quit smoking about 32 years ago. Her smoking use included cigarettes. She started smoking about 52 years ago. She has a 20 pack-year smoking history. She has never used smokeless tobacco. She reports current alcohol use of about 2.0  standard drinks of alcohol per week. She reports that she does not use drugs.  She is on disability. She notes exposure to perchloroethylene [facility]).   Allergies:  Allergies  Allergen Reactions   Oxycodone-Acetaminophen  Anaphylaxis    Swelling and rash  acetaminophen  / oxycodone   Celebrex  [Celecoxib ] Diarrhea   Codeine     Plerixafor     In 2016 during ASCT collection patient developed fever to 103.68F and required hospitalization   Benadryl  [Diphenhydramine ] Palpitations   Morphine  Itching and Rash   Ondansetron  Diarrhea   Tylenol  [Acetaminophen ] Itching and Rash    Current Medications: Current Outpatient Medications  Medication Sig Dispense Refill   allopurinol  (ZYLOPRIM ) 100 MG tablet TAKE 1 TABLET(100 MG) BY MOUTH DAILY as needed 90 tablet 1   amiodarone (PACERONE) 200 MG tablet Take 200 mg by mouth daily.     bisoprolol  (ZEBETA ) 10 MG tablet Take 1 tablet (10 mg total) by mouth daily. 90 tablet 1   cholestyramine  (QUESTRAN ) 4 GM/DOSE powder Take 1 packet (4 g total) by mouth 2 (two) times daily with a meal. 240 packet 2   diazepam  (VALIUM ) 5 MG tablet TAKE 1/2 TO 1 TABLET(2.5 TO 5 MG) BY MOUTH AT BEDTIME AS NEEDED FOR INSOMNIA 30 tablet 5   diclofenac  sodium (VOLTAREN ) 1 % GEL Apply 2 g topically 4 (four) times daily. 100 g 1   diphenoxylate -atropine  (LOMOTIL ) 2.5-0.025 MG tablet Take 2 tablets by mouth 4 (four) times daily as needed for diarrhea or loose stools. 180 tablet 3   ENTRESTO  24-26 MG Take 1 tablet by mouth 2 (two) times daily.     FLUoxetine  (PROZAC ) 40 MG capsule Take 1 capsule (40 mg total) by mouth daily. 90 capsule 1   furosemide  (LASIX ) 40 MG tablet Take 0.5 tablets (20 mg total) by mouth daily. Home dose.     glipiZIDE  (GLUCOTROL  XL) 2.5 MG 24 hr tablet Take 1 tablet (2.5 mg total) by mouth daily with breakfast.  90 tablet 1   glucose blood test strip      JARDIANCE 10 MG TABS tablet Take 10 mg by mouth daily.     lidocaine  (XYLOCAINE ) 2 % solution Use as  directed 15 mLs in the mouth or throat every 3 (three) hours as needed for mouth pain (swish and spit). 100 mL 0   metaxalone  (SKELAXIN ) 800 MG tablet Take 1 tablet (800 mg total) by mouth in the morning. 30 tablet 0   Multiple Vitamin (MULTIVITAMIN) tablet Take 1 tablet by mouth daily.     nystatin  cream (MYCOSTATIN ) Apply 1 Application topically 2 (two) times daily. To rash in abdominal folds. 30 g 0   omeprazole  (PRILOSEC) 40 MG capsule TAKE 1 CAPSULE IN THE MORNING AND TAKE 1 CAPSULE AT BEDTIME 180 capsule 3   potassium chloride  SA (KLOR-CON  M) 20 MEQ tablet Take 1 tablet (20 mEq total) by mouth daily. 7 tablet 0   promethazine  (PHENERGAN ) 25 MG tablet Take 1 tablet (25 mg total) by mouth every 6 (six) hours as needed for nausea or vomiting. 90 tablet 1   rosuvastatin  (CRESTOR ) 5 MG tablet Take 1 tablet (5 mg total) by mouth daily. 90 tablet 1   spironolactone (ALDACTONE) 25 MG tablet Take by mouth.     tiZANidine  (ZANAFLEX ) 4 MG tablet Take 1 tablet (4 mg total) by mouth at bedtime. 90 tablet 0   traMADol  (ULTRAM ) 50 MG tablet      traZODone  (DESYREL ) 100 MG tablet TAKE 1 TABLET AT BEDTIME 90 tablet 3   No current facility-administered medications for this visit.   Facility-Administered Medications Ordered in Other Visits  Medication Dose Route Frequency Provider Last Rate Last Admin   0.9 %  sodium chloride  infusion   Intravenous Continuous Babara Call, MD 40 mL/hr at 01/07/24 1046 New Bag at 01/07/24 1046   heparin  lock flush 100 unit/mL  500 Units Intracatheter Once PRN Babara Call, MD       magnesium  sulfate IVPB 4 g 100 mL  4 g Intravenous Once Babara Call, MD       promethazine  (PHENERGAN ) 12.5 mg in sodium chloride  0.9 % 50 mL IVPB  12.5 mg Intravenous Once Babara Call, MD 150 mL/hr at 01/07/24 1049 12.5 mg at 01/07/24 1049   sodium chloride  flush (NS) 0.9 % injection 10 mL  10 mL Intravenous PRN Babara Call, MD   10 mL at 04/02/21 0904   sodium chloride  flush (NS) 0.9 % injection 10 mL  10 mL  Intracatheter Once PRN Babara Call, MD        Review of Systems  Constitutional:  Negative for chills, fever, malaise/fatigue and weight loss.  HENT:  Negative for congestion and sore throat.   Eyes:  Negative for redness.  Respiratory:  Negative for cough, shortness of breath and wheezing.   Cardiovascular:  Negative for chest pain, palpitations and leg swelling.  Gastrointestinal:  Positive for diarrhea. Negative for abdominal pain, blood in stool, nausea and vomiting.  Genitourinary:  Negative for dysuria.  Musculoskeletal:  Positive for back pain and joint pain. Negative for myalgias.  Skin:  Negative for rash.  Neurological:  Positive for tingling and sensory change. Negative for dizziness and tremors.  Endo/Heme/Allergies:  Does not bruise/bleed easily.  Psychiatric/Behavioral:  Negative for hallucinations.     Performance status (ECOG): 1  Vitals Blood pressure 139/72, pulse 60, temperature 97.8 F (36.6 C), temperature source Tympanic, resp. rate 18, height 5' 3 (1.6 m), weight 162 lb (73.5 kg),  SpO2 99%.  Physical Exam Constitutional:      General: She is not in acute distress.    Appearance: She is not diaphoretic.  HENT:     Head: Normocephalic and atraumatic.  Eyes:     General: No scleral icterus. Cardiovascular:     Rate and Rhythm: Normal rate.     Heart sounds: Murmur heard.  Pulmonary:     Effort: Pulmonary effort is normal. No respiratory distress.     Breath sounds: Normal breath sounds. No wheezing.  Abdominal:     General: There is no distension.  Musculoskeletal:        General: Normal range of motion.     Cervical back: Normal range of motion and neck supple.     Comments: Bilateral SI joint tenderness.   Skin:    General: Skin is warm and dry.     Findings: No erythema.  Neurological:     Mental Status: She is alert and oriented to person, place, and time. Mental status is at baseline.     Motor: No abnormal muscle tone.  Psychiatric:         Mood and Affect: Mood and affect normal.      Laboratory data    Latest Ref Rng & Units 01/07/2024    8:33 AM 11/26/2023    9:35 AM 10/08/2023   10:34 AM  CBC  WBC 4.0 - 10.5 K/uL 4.7  6.3  6.8   Hemoglobin 12.0 - 15.0 g/dL 88.5  88.6  87.0   Hematocrit 36.0 - 46.0 % 33.7  33.7  38.7   Platelets 150 - 400 K/uL 89  111  134       Latest Ref Rng & Units 01/07/2024    8:33 AM 11/26/2023    9:33 AM 10/08/2023   10:34 AM  CMP  Glucose 70 - 99 mg/dL 852  864  859   BUN 8 - 23 mg/dL 22  22  28    Creatinine 0.44 - 1.00 mg/dL 8.76  8.71  8.01   Sodium 135 - 145 mmol/L 135  135  137   Potassium 3.5 - 5.1 mmol/L 4.1  3.6  4.3   Chloride 98 - 111 mmol/L 106  106  103   CO2 22 - 32 mmol/L 22  20  22    Calcium  8.9 - 10.3 mg/dL 9.0  8.6  8.6   Total Protein 6.5 - 8.1 g/dL 7.1  7.1  7.4   Total Bilirubin 0.0 - 1.2 mg/dL 0.4  0.6  0.4   Alkaline Phos 38 - 126 U/L 87  86  80   AST 15 - 41 U/L 41  24  42   ALT 0 - 44 U/L 39  24  49

## 2024-01-07 NOTE — Progress Notes (Signed)
 Nausea, which her new back pain this morning rates it at a 6.

## 2024-01-07 NOTE — Assessment & Plan Note (Signed)
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  Light chain ratio is normal. 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 Repeat immunofixation negative. . Labs reviewed and discussed with patient, stable M protein and stable light chain ratio Off Daratumumab due to frequent infections  Continue acyclovir prophylaxis.

## 2024-01-07 NOTE — Assessment & Plan Note (Signed)
 Recommend Tylenol  650mg  Q6h PRN, physical therapy

## 2024-01-07 NOTE — Assessment & Plan Note (Signed)
-

## 2024-01-07 NOTE — Assessment & Plan Note (Signed)
#  Chronic hypomagnesemia proceed with  IV magnesium today.  Continue weekly magnesium +/- IV magnesium.

## 2024-01-07 NOTE — Assessment & Plan Note (Signed)
 Lab Results  Component Value Date   HGB 11.4 (L) 01/07/2024   TIBC 335 06/05/2023   IRONPCTSAT 7 (L) 06/05/2023   FERRITIN 73 06/05/2023    Hb is stable

## 2024-01-08 LAB — KAPPA/LAMBDA LIGHT CHAINS
Kappa free light chain: 19.6 mg/L — ABNORMAL HIGH (ref 3.3–19.4)
Kappa, lambda light chain ratio: 0.8 (ref 0.26–1.65)
Lambda free light chains: 24.5 mg/L (ref 5.7–26.3)

## 2024-01-08 NOTE — Telephone Encounter (Signed)
 Requested Prescriptions  Refused Prescriptions Disp Refills   omeprazole  (PRILOSEC) 40 MG capsule [Pharmacy Med Name: Omeprazole  Oral Capsule Delayed Release 40 MG] 180 capsule 3    Sig: TAKE 1 CAPSULE IN THE MORNING AND TAKE 1 CAPSULE AT BEDTIME     Gastroenterology: Proton Pump Inhibitors Passed - 01/08/2024  5:58 PM      Passed - Valid encounter within last 12 months    Recent Outpatient Visits           1 month ago S/P aortic valve replacement   [facility] at [facility], Leita DEL, MD   2 months ago Amaurosis fugax of left eye   [facility] at [facility], Leita DEL, MD       Future Appointments             In 2 weeks Sarah [name], Leita DEL, MD [facility] at [facility], [facility]

## 2024-01-12 LAB — MULTIPLE MYELOMA PANEL, SERUM
Albumin SerPl Elph-Mcnc: 4.1 g/dL (ref 2.9–4.4)
Albumin/Glob SerPl: 1.5 (ref 0.7–1.7)
Alpha 1: 0.2 g/dL (ref 0.0–0.4)
Alpha2 Glob SerPl Elph-Mcnc: 0.9 g/dL (ref 0.4–1.0)
B-Globulin SerPl Elph-Mcnc: 1.2 g/dL (ref 0.7–1.3)
Gamma Glob SerPl Elph-Mcnc: 0.5 g/dL (ref 0.4–1.8)
Globulin, Total: 2.8 g/dL (ref 2.2–3.9)
IgA: 272 mg/dL (ref 87–352)
IgG (Immunoglobin G), Serum: 660 mg/dL (ref 586–1602)
IgM (Immunoglobulin M), Srm: 23 mg/dL — ABNORMAL LOW (ref 26–217)
Total Protein ELP: 6.9 g/dL (ref 6.0–8.5)

## 2024-01-14 ENCOUNTER — Inpatient Hospital Stay

## 2024-01-14 DIAGNOSIS — C9001 Multiple myeloma in remission: Secondary | ICD-10-CM | POA: Diagnosis not present

## 2024-01-14 LAB — MAGNESIUM: Magnesium: 1.4 mg/dL — ABNORMAL LOW (ref 1.7–2.4)

## 2024-01-14 MED ORDER — SODIUM CHLORIDE 0.9 % IV SOLN
INTRAVENOUS | Status: DC
  Filled 2024-01-14: qty 250

## 2024-01-14 MED ORDER — SODIUM CHLORIDE 0.9 % IV SOLN
12.5000 mg | Freq: Once | INTRAVENOUS | Status: AC
  Administered 2024-01-14: 12.5 mg via INTRAVENOUS
  Filled 2024-01-14: qty 0.5

## 2024-01-14 MED ORDER — MAGNESIUM SULFATE 4 GM/100ML IV SOLN
4.0000 g | Freq: Once | INTRAVENOUS | Status: AC
  Administered 2024-01-14: 4 g via INTRAVENOUS
  Filled 2024-01-14: qty 100

## 2024-01-14 MED ORDER — SODIUM CHLORIDE 0.9% FLUSH
10.0000 mL | INTRAVENOUS | Status: DC | PRN
  Administered 2024-01-14: 10 mL via INTRAVENOUS
  Filled 2024-01-14: qty 10

## 2024-01-14 MED ORDER — HEPARIN SOD (PORK) LOCK FLUSH 100 UNIT/ML IV SOLN
500.0000 [IU] | Freq: Once | INTRAVENOUS | Status: AC | PRN
  Administered 2024-01-14: 500 [IU]
  Filled 2024-01-14: qty 5

## 2024-01-16 LAB — HM DIABETES EYE EXAM

## 2024-01-21 ENCOUNTER — Other Ambulatory Visit

## 2024-01-21 ENCOUNTER — Ambulatory Visit

## 2024-01-21 MED FILL — Promethazine HCl Inj 25 MG/ML: INTRAMUSCULAR | Qty: 0.5 | Status: AC

## 2024-01-22 ENCOUNTER — Inpatient Hospital Stay

## 2024-01-22 DIAGNOSIS — C9001 Multiple myeloma in remission: Secondary | ICD-10-CM | POA: Diagnosis not present

## 2024-01-22 LAB — MAGNESIUM: Magnesium: 1.5 mg/dL — ABNORMAL LOW (ref 1.7–2.4)

## 2024-01-22 MED ORDER — MAGNESIUM SULFATE 4 GM/100ML IV SOLN
4.0000 g | Freq: Once | INTRAVENOUS | Status: AC
  Administered 2024-01-22: 4 g via INTRAVENOUS
  Filled 2024-01-22: qty 100

## 2024-01-22 MED ORDER — SODIUM CHLORIDE 0.9 % IV SOLN
INTRAVENOUS | Status: DC
  Filled 2024-01-22: qty 250

## 2024-01-22 MED ORDER — HEPARIN SOD (PORK) LOCK FLUSH 100 UNIT/ML IV SOLN
500.0000 [IU] | Freq: Once | INTRAVENOUS | Status: AC
  Administered 2024-01-22: 500 [IU] via INTRAVENOUS
  Filled 2024-01-22: qty 5

## 2024-01-22 MED ORDER — SODIUM CHLORIDE 0.9 % IV SOLN
12.5000 mg | Freq: Once | INTRAVENOUS | Status: AC
  Administered 2024-01-22: 12.5 mg via INTRAVENOUS
  Filled 2024-01-22: qty 0.5

## 2024-01-23 ENCOUNTER — Encounter: Payer: Self-pay | Admitting: Podiatry

## 2024-01-23 ENCOUNTER — Ambulatory Visit (INDEPENDENT_AMBULATORY_CARE_PROVIDER_SITE_OTHER): Admitting: Internal Medicine

## 2024-01-23 ENCOUNTER — Ambulatory Visit (INDEPENDENT_AMBULATORY_CARE_PROVIDER_SITE_OTHER): Admitting: Podiatry

## 2024-01-23 ENCOUNTER — Encounter: Payer: Self-pay | Admitting: Internal Medicine

## 2024-01-23 VITALS — BP 122/66 | HR 61 | Ht 63.0 in | Wt 165.0 lb

## 2024-01-23 DIAGNOSIS — M79675 Pain in left toe(s): Secondary | ICD-10-CM | POA: Diagnosis not present

## 2024-01-23 DIAGNOSIS — I2510 Atherosclerotic heart disease of native coronary artery without angina pectoris: Secondary | ICD-10-CM

## 2024-01-23 DIAGNOSIS — B351 Tinea unguium: Secondary | ICD-10-CM | POA: Diagnosis not present

## 2024-01-23 DIAGNOSIS — I10 Essential (primary) hypertension: Secondary | ICD-10-CM | POA: Diagnosis not present

## 2024-01-23 DIAGNOSIS — Z7984 Long term (current) use of oral hypoglycemic drugs: Secondary | ICD-10-CM

## 2024-01-23 DIAGNOSIS — M79674 Pain in right toe(s): Secondary | ICD-10-CM | POA: Diagnosis not present

## 2024-01-23 DIAGNOSIS — Z1231 Encounter for screening mammogram for malignant neoplasm of breast: Secondary | ICD-10-CM

## 2024-01-23 DIAGNOSIS — E119 Type 2 diabetes mellitus without complications: Secondary | ICD-10-CM

## 2024-01-23 NOTE — Assessment & Plan Note (Signed)
 Blood pressure is well controlled on multiple medications.  She has some low readings - suggest holding furosemide  for several days during that time. No medication side effects noted. Plan to continue current medications.

## 2024-01-23 NOTE — Assessment & Plan Note (Addendum)
 Now on Enstresto, amiodarone, bisoprolol , spironolactone and Jardiance. Crestor  started last visit with no side effects Will check lipids

## 2024-01-23 NOTE — Assessment & Plan Note (Signed)
 Blood sugars have been stable.  No hypoglycemic events since last visit. Currently medications are Jardiance and glipizide . Last visit medical regimen changes were adding jardiance for CAD/HF. Lab Results  Component Value Date   HGBA1C 6.2 (A) 06/02/2023

## 2024-01-23 NOTE — Assessment & Plan Note (Signed)
>>  ASSESSMENT AND PLAN FOR DIABETES MELLITUS TREATED WITH ORAL MEDICATION (HCC) WRITTEN ON 01/23/2024 10:17 AM BY Taira Knabe H, MD  Blood sugars have been stable.  No hypoglycemic events since last visit. Currently medications are Jardiance and glipizide . Last visit medical regimen changes were adding jardiance for CAD/HF. Lab Results  Component Value Date   HGBA1C 6.2 (A) 06/02/2023

## 2024-01-23 NOTE — Progress Notes (Signed)
 Date:  01/23/2024   Name:  Sarah [name]   DOB:  [birth year]   MRN:  [id number]   Chief Complaint: Hyperlipidemia  Hyperlipidemia This is a chronic problem. Pertinent negatives include no chest pain, myalgias or shortness of breath.  Diabetes She presents for her follow-up diabetic visit. She has type 2 diabetes mellitus. Pertinent negatives for hypoglycemia include no dizziness or headaches. Pertinent negatives for diabetes include no chest pain, no fatigue and no weakness.  Hypertension This is a chronic problem. The problem is controlled. Pertinent negatives include no chest pain, headaches, palpitations or shortness of breath.    Review of Systems  Constitutional:  Negative for fatigue and unexpected weight change.  HENT:  Negative for trouble swallowing.   Eyes:  Negative for visual disturbance.  Respiratory:  Negative for cough, chest tightness, shortness of breath and wheezing.   Cardiovascular:  Negative for chest pain, palpitations and leg swelling.  Gastrointestinal:  Negative for abdominal pain, constipation and diarrhea.  Musculoskeletal:  Negative for arthralgias and myalgias.  Neurological:  Negative for dizziness, weakness, light-headedness and headaches.     Lab Results  Component Value Date   NA 135 01/07/2024   K 4.1 01/07/2024   CO2 22 01/07/2024   GLUCOSE 147 (H) 01/07/2024   BUN 22 01/07/2024   CREATININE 1.23 (H) 01/07/2024   CALCIUM  9.0 01/07/2024   GFRNONAA 48 (L) 01/07/2024   Lab Results  Component Value Date   CHOL 164 12/11/2022   HDL 67 12/11/2022   LDLCALC 83 12/11/2022   TRIG 68 12/11/2022   CHOLHDL 2.4 12/11/2022   Lab Results  Component Value Date   TSH 1.450 07/22/2022   Lab Results  Component Value Date   HGBA1C 6.2 (A) 06/02/2023   Lab Results  Component Value Date   WBC 4.7 01/07/2024   HGB 11.4 (L) 01/07/2024   HCT 33.7 (L) 01/07/2024   MCV 86.2 01/07/2024   PLT 89 (L) 01/07/2024   Lab Results  Component Value Date    ALT 39 01/07/2024   AST 41 01/07/2024   ALKPHOS 87 01/07/2024   BILITOT 0.4 01/07/2024   Lab Results  Component Value Date   VD25OH 25.64 (L) 12/11/2022     Patient Active Problem List   Diagnosis Date Noted   SI (sacroiliac) joint inflammation (HCC) 01/07/2024   S/P aortic valve replacement 11/14/2023   Amaurosis fugax of left eye 10/24/2023   Double vision with both eyes open 10/24/2023   Stage 4 chronic kidney disease (HCC) 10/08/2023   Jaw pain 03/13/2023   Prolonged QT interval 03/07/2023   Vitamin D  deficiency 12/09/2022   Diabetes mellitus treated with oral medication (HCC) 12/09/2022   NICM (nonischemic cardiomyopathy) (HCC) 08/05/2022   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   Overweight (BMI 25.0-29.9) 02/04/2022   Complete left bundle branch block 09/06/2021   Anemia of chronic renal failure, stage 3a (HCC) 09/06/2021   Gastroesophageal reflux disease without esophagitis 09/06/2021   Chronic systolic CHF (congestive heart failure) (HCC) 08/22/2021   Low immunoglobulin level    Multiple myeloma in remission (HCC) 05/14/2021   Neuropathy 04/02/2021   Frequent PVCs 12/04/2020   Coronary artery disease involving native coronary artery of native heart 04/26/2020   Hypokalemia 03/11/2020   Physical deconditioning 02/07/2020   Impaired nutrition 02/07/2020   S/P autologous bone marrow transplantation (HCC) 09/10/2019   Hypocalcemia 08/28/2019   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   Hyperlipidemia associated with type 2 diabetes mellitus (HCC) 11/25/2018   Hypomagnesemia  08/20/2018   Goals of care, counseling/discussion 08/20/2018   B12 deficiency 08/20/2018   Thrombocytopenia (HCC) 02/09/2018   Benign essential HTN 08/21/2017   Carotid artery stenosis, asymptomatic, bilateral 08/06/2017   Thyroid  nodule 08/06/2017   Iron  deficiency anemia due to chronic blood loss 05/13/2017   Spinal stenosis of lumbar region with neurogenic claudication 04/09/2017   Long term  prescription opiate use 09/07/2016   Pain medication agreement signed 07/08/2016   Spondylosis of lumbar region without myelopathy or radiculopathy 07/08/2016   Nonrheumatic aortic valve stenosis 06/05/2016   Environmental and seasonal allergies 04/19/2016   Insomnia 04/19/2016   Asthma 04/19/2016   Aortic regurgitation 04/19/2016   Type II diabetes mellitus with complication (HCC) 04/12/2016   Depression, major, single episode, in partial remission (HCC) 04/12/2016   Irritable bowel syndrome (IBS) 04/12/2016   Hepatic steatosis 04/12/2016   Multiple myeloma in remission (HCC) 04/02/2016   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HCC) 07/06/2015   History of stem cell transplant (HCC) 05/16/2015    Allergies  Allergen Reactions   Oxycodone-Acetaminophen  Anaphylaxis    Swelling and rash  acetaminophen  / oxycodone   Celebrex  [Celecoxib ] Diarrhea   Codeine     Plerixafor     In 2016 during ASCT collection patient developed fever to 103.25F and required hospitalization   Benadryl  [Diphenhydramine ] Palpitations   Morphine  Itching and Rash   Ondansetron  Diarrhea   Tylenol  [Acetaminophen ] Itching and Rash    Past Surgical History:  Procedure Laterality Date   ABDOMINAL HYSTERECTOMY     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   CARDIAC ELECTROPHYSIOLOGY MAPPING AND ABLATION     CARPAL TUNNEL RELEASE Bilateral    CHOLECYSTECTOMY  2008   COLONOSCOPY WITH PROPOFOL  N/A 05/07/2017   Procedure: COLONOSCOPY WITH PROPOFOL ;  Surgeon: Therisa Bi, MD;  Location: [facility] ENDOSCOPY;  Service: Gastroenterology;  Laterality: N/A;   COLONOSCOPY WITH PROPOFOL  N/A 12/19/2020   Procedure: COLONOSCOPY WITH PROPOFOL ;  Surgeon: Therisa Bi, MD;  Location: [facility] ENDOSCOPY;  Service: Gastroenterology;  Laterality: N/A;   ESOPHAGOGASTRODUODENOSCOPY (EGD) WITH PROPOFOL  N/A 05/07/2017   Procedure: ESOPHAGOGASTRODUODENOSCOPY (EGD) WITH PROPOFOL ;  Surgeon: Therisa Bi, MD;  Location: [facility] ENDOSCOPY;  Service:  Gastroenterology;  Laterality: N/A;   FOOT SURGERY Bilateral    INCONTINENCE SURGERY  2009   INTERSTIM IMPLANT PLACEMENT     other     over active bladder   OTHER SURGICAL HISTORY     bladder stimulator    PARTIAL HYSTERECTOMY  03/1996   fibroids   PORTA CATH INSERTION N/A 03/10/2019   Procedure: PORTA CATH INSERTION;  Surgeon: Marea Selinda RAMAN, MD;  Location: [facility] INVASIVE CV LAB;  Service: Cardiovascular;  Laterality: N/A;   TONSILLECTOMY  2007   TRANSCATHETER AORTIC VALVE REPLACEMENT, TRANSFEMORAL  11/06/2023   TUBAL LIGATION  1984    Social History   Tobacco Use   Smoking status: Former    Current packs/day: 0.00    Average packs/day: 1 pack/day for 20.0 years (20.0 ttl pk-yrs)    Types: Cigarettes    Start date: 07/02/1971    Quit date: 07/02/1991    Years since quitting: 32.5   Smokeless tobacco: Never  Vaping Use   Vaping status: Never Used  Substance Use Topics   Alcohol use: Yes    Alcohol/week: 2.0 standard drinks of alcohol    Types: 2 Cans of beer per week   Drug use: No     Medication list has been reviewed and updated.  Current Meds  Medication Sig  allopurinol  (ZYLOPRIM ) 100 MG tablet TAKE 1 TABLET(100 MG) BY MOUTH DAILY as needed   amiodarone (PACERONE) 200 MG tablet Take 200 mg by mouth daily.   bisoprolol  (ZEBETA ) 10 MG tablet Take 1 tablet (10 mg total) by mouth daily.   celecoxib  (CELEBREX ) 200 MG capsule Take 200 mg by mouth daily. (Patient taking differently: Take 200 mg by mouth daily as needed for mild pain (pain score 1-3).)   cholestyramine  (QUESTRAN ) 4 GM/DOSE powder Take 1 packet (4 g total) by mouth 2 (two) times daily with a meal.   diazepam  (VALIUM ) 5 MG tablet TAKE 1/2 TO 1 TABLET(2.5 TO 5 MG) BY MOUTH AT BEDTIME AS NEEDED FOR INSOMNIA   diclofenac  sodium (VOLTAREN ) 1 % GEL Apply 2 g topically 4 (four) times daily.   diphenoxylate -atropine  (LOMOTIL ) 2.5-0.025 MG tablet Take 2 tablets by mouth 4 (four) times daily as needed for diarrhea or  loose stools.   ENTRESTO  24-26 MG Take 1 tablet by mouth 2 (two) times daily.   FLUoxetine  (PROZAC ) 40 MG capsule Take 1 capsule (40 mg total) by mouth daily.   furosemide  (LASIX ) 40 MG tablet Take 0.5 tablets (20 mg total) by mouth daily. Home dose. (Patient taking differently: Take 20 mg by mouth daily as needed. Home dose.)   glipiZIDE  (GLUCOTROL  XL) 2.5 MG 24 hr tablet Take 1 tablet (2.5 mg total) by mouth daily with breakfast.   glucose blood test strip    JARDIANCE 10 MG TABS tablet Take 10 mg by mouth daily.   lidocaine  (XYLOCAINE ) 2 % solution Use as directed 15 mLs in the mouth or throat every 3 (three) hours as needed for mouth pain (swish and spit).   metaxalone  (SKELAXIN ) 800 MG tablet Take 1 tablet (800 mg total) by mouth in the morning.   Multiple Vitamin (MULTIVITAMIN) tablet Take 1 tablet by mouth daily.   nystatin  cream (MYCOSTATIN ) Apply 1 Application topically 2 (two) times daily. To rash in abdominal folds.   omeprazole  (PRILOSEC) 40 MG capsule TAKE 1 CAPSULE IN THE MORNING AND TAKE 1 CAPSULE AT BEDTIME   potassium chloride  SA (KLOR-CON  M) 20 MEQ tablet Take 1 tablet (20 mEq total) by mouth daily.   promethazine  (PHENERGAN ) 25 MG tablet Take 1 tablet (25 mg total) by mouth every 6 (six) hours as needed for nausea or vomiting.   rosuvastatin  (CRESTOR ) 5 MG tablet Take 1 tablet (5 mg total) by mouth daily.   spironolactone (ALDACTONE) 25 MG tablet Take by mouth.   tiZANidine  (ZANAFLEX ) 4 MG tablet Take 1 tablet (4 mg total) by mouth at bedtime.   traMADol  (ULTRAM ) 50 MG tablet    traZODone  (DESYREL ) 100 MG tablet TAKE 1 TABLET AT BEDTIME   [DISCONTINUED] venlafaxine XR (EFFEXOR-XR) 75 MG 24 hr capsule Take 75 mg by mouth daily.       01/23/2024    9:42 AM 11/14/2023    1:17 PM 10/24/2023    2:35 PM 06/02/2023    3:38 PM  GAD 7 : Generalized Anxiety Score  Nervous, Anxious, on Edge 2 2 1 3   Control/stop worrying 1 1 1 3   Worry too much - different things 1 1 1 3   Trouble  relaxing 1 1 0 3  Restless 0 0 0 3  Easily annoyed or irritable 0 0 0 1  Afraid - awful might happen 0 0 0 0  Total GAD 7 Score 5 5 3 16   Anxiety Difficulty Somewhat difficult Not difficult at all Not difficult at all Somewhat  difficult       01/23/2024    9:41 AM 01/22/2024    9:43 AM 12/25/2023   10:00 AM  Depression screen PHQ 2/9  Decreased Interest 0 0 0  Down, Depressed, Hopeless 0 0 0  PHQ - 2 Score 0 0 0  Altered sleeping 1    Tired, decreased energy 3    Change in appetite 1    Feeling bad or failure about yourself  0    Trouble concentrating 2    Moving slowly or fidgety/restless 0    Suicidal thoughts 0    PHQ-9 Score 7    Difficult doing work/chores Somewhat difficult      BP Readings from Last 3 Encounters:  01/23/24 122/66  01/22/24 (!) 144/69  01/07/24 139/72    Physical Exam Vitals and nursing note reviewed.  Constitutional:      General: She is not in acute distress.    Appearance: She is well-developed.  HENT:     Head: Normocephalic and atraumatic.  Pulmonary:     Effort: Pulmonary effort is normal. No respiratory distress.  Skin:    General: Skin is warm and dry.     Findings: No rash.  Neurological:     Mental Status: She is alert and oriented to person, place, and time.  Psychiatric:        Mood and Affect: Mood normal.        Behavior: Behavior normal.    Diabetic Foot Exam - Simple   Simple Foot Form Diabetic Foot exam was performed with the following findings: Yes 01/23/2024 10:09 AM  Visual Inspection No deformities, no ulcerations, no other skin breakdown bilaterally: Yes Sensation Testing See comments: Yes Pulse Check Posterior Tibialis and Dorsalis pulse intact bilaterally: Yes Comments Decreased light touch on the toes bilaterally      Wt Readings from Last 3 Encounters:  01/23/24 165 lb (74.8 kg)  01/07/24 162 lb (73.5 kg)  11/26/23 161 lb (73 kg)    BP 122/66   Pulse 61   Ht 5' 3 (1.6 m)   Wt 165 lb (74.8 kg)    SpO2 96%   BMI 29.23 kg/m   Assessment and Plan:  Problem List Items Addressed This Visit       Unprioritized   Benign essential HTN - Primary (Chronic)   Blood pressure is well controlled on multiple medications.  She has some low readings - suggest holding furosemide  for several days during that time. No medication side effects noted. Plan to continue current medications.       Relevant Orders   TSH   Coronary artery disease involving native coronary artery of native heart (Chronic)   Now on Enstresto, amiodarone, bisoprolol , spironolactone and Jardiance. Crestor  started last visit with no side effects Will check lipids      Relevant Orders   Lipid panel   Diabetes mellitus treated with oral medication (HCC)   Blood sugars have been stable.  No hypoglycemic events since last visit. Currently medications are Jardiance and glipizide . Last visit medical regimen changes were adding jardiance for CAD/HF. Lab Results  Component Value Date   HGBA1C 6.2 (A) 06/02/2023          Relevant Orders   Hemoglobin A1c   Microalbumin / creatinine urine ratio   Other Visit Diagnoses       Long term current use of oral hypoglycemic drug         Encounter for screening mammogram for breast cancer  Relevant Orders   MM 3D SCREENING MAMMOGRAM BILATERAL BREAST       Return in about 4 months (around 05/25/2024) for DM, HTN.    Leita HILARIO Adie, MD [facility] [place]

## 2024-01-24 ENCOUNTER — Other Ambulatory Visit: Payer: Self-pay

## 2024-01-24 LAB — MICROALBUMIN / CREATININE URINE RATIO
Creatinine, Urine: 115.7 mg/dL
Microalb/Creat Ratio: 51 mg/g{creat} — ABNORMAL HIGH (ref 0–29)
Microalbumin, Urine: 59 ug/mL

## 2024-01-24 LAB — LIPID PANEL
Chol/HDL Ratio: 1.9 ratio (ref 0.0–4.4)
Cholesterol, Total: 146 mg/dL (ref 100–199)
HDL: 77 mg/dL (ref >39)
LDL Chol Calc (NIH): 54 mg/dL (ref 0–99)
Triglycerides: 76 mg/dL (ref 0–149)
VLDL Cholesterol Cal: 15 mg/dL (ref 5–40)

## 2024-01-24 LAB — HEMOGLOBIN A1C
Est. average glucose Bld gHb Est-mCnc: 137 mg/dL
Hgb A1c MFr Bld: 6.4 % — ABNORMAL HIGH (ref 4.8–5.6)

## 2024-01-24 LAB — TSH: TSH: 2.17 u[IU]/mL (ref 0.450–4.500)

## 2024-01-25 ENCOUNTER — Ambulatory Visit: Payer: Self-pay | Admitting: Internal Medicine

## 2024-01-26 NOTE — Progress Notes (Signed)
  Subjective:  Patient ID: Sarah [name], female    DOB: [birth year],  MRN: [id number]  68 y.o. female presents to clinic with  painful thick toenails that are difficult to trim. Pain interferes with ambulation. Aggravating factors include wearing enclosed shoe gear. Pain is relieved with periodic professional debridement.  Chief Complaint  Patient presents with   Nail Problem    Thick painful toenails, 3 month follow up    New problem(s): None   PCP is Justus Leita DEL, MD.  Allergies  Allergen Reactions   Oxycodone-Acetaminophen  Anaphylaxis    Swelling and rash  acetaminophen  / oxycodone   Celebrex  [Celecoxib ] Diarrhea   Codeine     Plerixafor     In 2016 during ASCT collection patient developed fever to 103.19F and required hospitalization   Benadryl  [Diphenhydramine ] Palpitations   Morphine  Itching and Rash   Ondansetron  Diarrhea   Tylenol  [Acetaminophen ] Itching and Rash    Review of Systems: Negative except as noted in the HPI.   Objective:  Sarah [name] is a pleasant 68 y.o. female WD, WN in NAD. AAO x 3.  Vascular Examination: Vascular status intact b/l with palpable pedal pulses. Pedal hair sparse. CFT immediate b/l. No edema. No pain with calf compression b/l. Skin temperature gradient WNL b/l.   Neurological Examination: Sensation grossly intact b/l with 10 gram monofilament. Vibratory sensation intact b/l.   Dermatological Examination: Pedal skin with normal turgor, texture and tone b/l. Toenails 1-5 b/l thick, discolored, elongated with subungual debris and pain on dorsal palpation. No hyperkeratotic lesions noted b/l.   Musculoskeletal Examination: Muscle strength 5/5 to b/l LE. No pain, crepitus or joint limitation noted with ROM bilateral LE. No gross bony deformities bilaterally.  Radiographs: None  Last A1c:      Latest Ref Rng & Units 01/23/2024   10:52 AM 06/02/2023    4:58 PM  Hemoglobin A1C  Hemoglobin-A1c 4.8 - 5.6 % 6.4  6.2       Assessment:   1. Pain due to onychomycosis of toenails of both feet    Plan:  Consent given for treatment. Patient examined. All patient's and/or POA's questions/concerns addressed on today's visit. Recommended [employer] sneakers series 800 or higher for shoe gear. Mycotic toenails 1-5 debrided in length and girth without incident. Continue soft, supportive shoe gear daily. Report any pedal injuries to medical professional. Call office if there are any quesitons/concerns. -Patient/POA to call should there be question/concern in the interim.  Return in about 3 months (around 04/24/2024).  Delon LITTIE Merlin, DPM      [place] LOCATION: [address] [place] [address]                   Office [phone]   [facility] LOCATION: [address] [place], [place] [address] Office [phone]

## 2024-01-28 ENCOUNTER — Inpatient Hospital Stay

## 2024-01-28 DIAGNOSIS — C9001 Multiple myeloma in remission: Secondary | ICD-10-CM

## 2024-01-28 LAB — MAGNESIUM: Magnesium: 1.4 mg/dL — ABNORMAL LOW (ref 1.7–2.4)

## 2024-01-28 MED ORDER — HEPARIN SOD (PORK) LOCK FLUSH 100 UNIT/ML IV SOLN
500.0000 [IU] | Freq: Once | INTRAVENOUS | Status: AC | PRN
Start: 2024-01-28 — End: 2024-01-28
  Administered 2024-01-28: 500 [IU]
  Filled 2024-01-28: qty 5

## 2024-01-28 MED ORDER — SODIUM CHLORIDE 0.9 % IV SOLN
INTRAVENOUS | Status: DC
  Filled 2024-01-28: qty 250

## 2024-01-28 MED ORDER — ALTEPLASE 2 MG IJ SOLR
2.0000 mg | Freq: Once | INTRAMUSCULAR | Status: DC | PRN
Start: 2024-01-28 — End: 2024-01-28
  Filled 2024-01-28: qty 2

## 2024-01-28 MED ORDER — MAGNESIUM SULFATE 4 GM/100ML IV SOLN
4.0000 g | Freq: Once | INTRAVENOUS | Status: AC
  Administered 2024-01-28: 4 g via INTRAVENOUS
  Filled 2024-01-28: qty 100

## 2024-01-28 MED ORDER — SODIUM CHLORIDE 0.9 % IV SOLN
12.5000 mg | Freq: Once | INTRAVENOUS | Status: AC
  Administered 2024-01-28: 12.5 mg via INTRAVENOUS
  Filled 2024-01-28: qty 0.5

## 2024-02-03 DIAGNOSIS — M62830 Muscle spasm of back: Secondary | ICD-10-CM | POA: Diagnosis not present

## 2024-02-03 DIAGNOSIS — M9902 Segmental and somatic dysfunction of thoracic region: Secondary | ICD-10-CM | POA: Diagnosis not present

## 2024-02-03 DIAGNOSIS — M9901 Segmental and somatic dysfunction of cervical region: Secondary | ICD-10-CM | POA: Diagnosis not present

## 2024-02-03 DIAGNOSIS — M5412 Radiculopathy, cervical region: Secondary | ICD-10-CM | POA: Diagnosis not present

## 2024-02-04 ENCOUNTER — Inpatient Hospital Stay

## 2024-02-04 ENCOUNTER — Inpatient Hospital Stay: Attending: Oncology

## 2024-02-04 VITALS — BP 126/70 | HR 56 | Temp 97.7°F | Resp 18

## 2024-02-04 DIAGNOSIS — Z85038 Personal history of other malignant neoplasm of large intestine: Secondary | ICD-10-CM | POA: Diagnosis not present

## 2024-02-04 DIAGNOSIS — Z803 Family history of malignant neoplasm of breast: Secondary | ICD-10-CM | POA: Diagnosis not present

## 2024-02-04 DIAGNOSIS — Z800 Family history of malignant neoplasm of digestive organs: Secondary | ICD-10-CM | POA: Diagnosis not present

## 2024-02-04 DIAGNOSIS — T451X5A Adverse effect of antineoplastic and immunosuppressive drugs, initial encounter: Secondary | ICD-10-CM | POA: Diagnosis not present

## 2024-02-04 DIAGNOSIS — N184 Chronic kidney disease, stage 4 (severe): Secondary | ICD-10-CM | POA: Diagnosis not present

## 2024-02-04 DIAGNOSIS — E1122 Type 2 diabetes mellitus with diabetic chronic kidney disease: Secondary | ICD-10-CM | POA: Insufficient documentation

## 2024-02-04 DIAGNOSIS — Z87891 Personal history of nicotine dependence: Secondary | ICD-10-CM | POA: Diagnosis not present

## 2024-02-04 DIAGNOSIS — Z888 Allergy status to other drugs, medicaments and biological substances status: Secondary | ICD-10-CM | POA: Diagnosis not present

## 2024-02-04 DIAGNOSIS — Z832 Family history of diseases of the blood and blood-forming organs and certain disorders involving the immune mechanism: Secondary | ICD-10-CM | POA: Insufficient documentation

## 2024-02-04 DIAGNOSIS — D500 Iron deficiency anemia secondary to blood loss (chronic): Secondary | ICD-10-CM | POA: Diagnosis not present

## 2024-02-04 DIAGNOSIS — Z886 Allergy status to analgesic agent status: Secondary | ICD-10-CM | POA: Insufficient documentation

## 2024-02-04 DIAGNOSIS — Z79899 Other long term (current) drug therapy: Secondary | ICD-10-CM | POA: Diagnosis not present

## 2024-02-04 DIAGNOSIS — I252 Old myocardial infarction: Secondary | ICD-10-CM | POA: Diagnosis not present

## 2024-02-04 DIAGNOSIS — Z9221 Personal history of antineoplastic chemotherapy: Secondary | ICD-10-CM | POA: Insufficient documentation

## 2024-02-04 DIAGNOSIS — G620 Drug-induced polyneuropathy: Secondary | ICD-10-CM | POA: Diagnosis not present

## 2024-02-04 DIAGNOSIS — C9001 Multiple myeloma in remission: Secondary | ICD-10-CM | POA: Diagnosis not present

## 2024-02-04 DIAGNOSIS — I130 Hypertensive heart and chronic kidney disease with heart failure and stage 1 through stage 4 chronic kidney disease, or unspecified chronic kidney disease: Secondary | ICD-10-CM | POA: Diagnosis not present

## 2024-02-04 DIAGNOSIS — Z90710 Acquired absence of both cervix and uterus: Secondary | ICD-10-CM | POA: Insufficient documentation

## 2024-02-04 DIAGNOSIS — Z833 Family history of diabetes mellitus: Secondary | ICD-10-CM | POA: Insufficient documentation

## 2024-02-04 DIAGNOSIS — Z885 Allergy status to narcotic agent status: Secondary | ICD-10-CM | POA: Diagnosis not present

## 2024-02-04 DIAGNOSIS — Z808 Family history of malignant neoplasm of other organs or systems: Secondary | ICD-10-CM | POA: Insufficient documentation

## 2024-02-04 DIAGNOSIS — Z841 Family history of disorders of kidney and ureter: Secondary | ICD-10-CM | POA: Insufficient documentation

## 2024-02-04 DIAGNOSIS — I5032 Chronic diastolic (congestive) heart failure: Secondary | ICD-10-CM | POA: Diagnosis not present

## 2024-02-04 DIAGNOSIS — E538 Deficiency of other specified B group vitamins: Secondary | ICD-10-CM | POA: Insufficient documentation

## 2024-02-04 DIAGNOSIS — I429 Cardiomyopathy, unspecified: Secondary | ICD-10-CM | POA: Insufficient documentation

## 2024-02-04 DIAGNOSIS — Z801 Family history of malignant neoplasm of trachea, bronchus and lung: Secondary | ICD-10-CM | POA: Insufficient documentation

## 2024-02-04 DIAGNOSIS — Z822 Family history of deafness and hearing loss: Secondary | ICD-10-CM | POA: Insufficient documentation

## 2024-02-04 DIAGNOSIS — Z8249 Family history of ischemic heart disease and other diseases of the circulatory system: Secondary | ICD-10-CM | POA: Insufficient documentation

## 2024-02-04 DIAGNOSIS — Z9049 Acquired absence of other specified parts of digestive tract: Secondary | ICD-10-CM | POA: Insufficient documentation

## 2024-02-04 LAB — MAGNESIUM: Magnesium: 1.3 mg/dL — ABNORMAL LOW (ref 1.7–2.4)

## 2024-02-04 MED ORDER — MAGNESIUM SULFATE 2 GM/50ML IV SOLN
2.0000 g | Freq: Once | INTRAVENOUS | Status: AC
  Administered 2024-02-04: 2 g via INTRAVENOUS
  Filled 2024-02-04: qty 50

## 2024-02-04 MED ORDER — MAGNESIUM SULFATE 4 GM/100ML IV SOLN
4.0000 g | Freq: Once | INTRAVENOUS | Status: AC
  Administered 2024-02-04: 4 g via INTRAVENOUS
  Filled 2024-02-04: qty 100

## 2024-02-04 MED ORDER — SODIUM CHLORIDE 0.9 % IV SOLN
INTRAVENOUS | Status: DC
  Filled 2024-02-04: qty 250

## 2024-02-04 MED ORDER — SODIUM CHLORIDE 0.9 % IV SOLN
12.5000 mg | Freq: Once | INTRAVENOUS | Status: AC
  Administered 2024-02-04: 12.5 mg via INTRAVENOUS
  Filled 2024-02-04: qty 0.5

## 2024-02-10 DIAGNOSIS — M5412 Radiculopathy, cervical region: Secondary | ICD-10-CM | POA: Diagnosis not present

## 2024-02-10 DIAGNOSIS — M62830 Muscle spasm of back: Secondary | ICD-10-CM | POA: Diagnosis not present

## 2024-02-10 DIAGNOSIS — M9901 Segmental and somatic dysfunction of cervical region: Secondary | ICD-10-CM | POA: Diagnosis not present

## 2024-02-10 DIAGNOSIS — M9902 Segmental and somatic dysfunction of thoracic region: Secondary | ICD-10-CM | POA: Diagnosis not present

## 2024-02-11 ENCOUNTER — Inpatient Hospital Stay

## 2024-02-11 VITALS — BP 148/72 | HR 56 | Temp 98.4°F | Resp 18

## 2024-02-11 DIAGNOSIS — I130 Hypertensive heart and chronic kidney disease with heart failure and stage 1 through stage 4 chronic kidney disease, or unspecified chronic kidney disease: Secondary | ICD-10-CM | POA: Diagnosis not present

## 2024-02-11 DIAGNOSIS — C9001 Multiple myeloma in remission: Secondary | ICD-10-CM | POA: Diagnosis not present

## 2024-02-11 DIAGNOSIS — D500 Iron deficiency anemia secondary to blood loss (chronic): Secondary | ICD-10-CM | POA: Diagnosis not present

## 2024-02-11 DIAGNOSIS — E538 Deficiency of other specified B group vitamins: Secondary | ICD-10-CM | POA: Diagnosis not present

## 2024-02-11 DIAGNOSIS — T451X5A Adverse effect of antineoplastic and immunosuppressive drugs, initial encounter: Secondary | ICD-10-CM | POA: Diagnosis not present

## 2024-02-11 DIAGNOSIS — N184 Chronic kidney disease, stage 4 (severe): Secondary | ICD-10-CM | POA: Diagnosis not present

## 2024-02-11 DIAGNOSIS — E1122 Type 2 diabetes mellitus with diabetic chronic kidney disease: Secondary | ICD-10-CM | POA: Diagnosis not present

## 2024-02-11 DIAGNOSIS — G620 Drug-induced polyneuropathy: Secondary | ICD-10-CM | POA: Diagnosis not present

## 2024-02-11 LAB — MAGNESIUM: Magnesium: 1.6 mg/dL — ABNORMAL LOW (ref 1.7–2.4)

## 2024-02-11 MED ORDER — SODIUM CHLORIDE 0.9 % IV SOLN
12.5000 mg | Freq: Once | INTRAVENOUS | Status: AC
  Administered 2024-02-11 (×2): 12.5 mg via INTRAVENOUS
  Filled 2024-02-11: qty 0.5

## 2024-02-11 MED ORDER — MAGNESIUM SULFATE 2 GM/50ML IV SOLN
2.0000 g | Freq: Once | INTRAVENOUS | Status: AC
  Administered 2024-02-11 (×2): 2 g via INTRAVENOUS
  Filled 2024-02-11: qty 50

## 2024-02-11 MED ORDER — SODIUM CHLORIDE 0.9 % IV SOLN
INTRAVENOUS | Status: DC
  Filled 2024-02-11: qty 250

## 2024-02-11 NOTE — Addendum Note (Signed)
 Addended byBETHA KLUVER, Yariah Selvey L on: 02/11/2024 11:07 AM   Modules accepted: Orders

## 2024-02-17 DIAGNOSIS — M5412 Radiculopathy, cervical region: Secondary | ICD-10-CM | POA: Diagnosis not present

## 2024-02-17 DIAGNOSIS — M9901 Segmental and somatic dysfunction of cervical region: Secondary | ICD-10-CM | POA: Diagnosis not present

## 2024-02-17 DIAGNOSIS — M9902 Segmental and somatic dysfunction of thoracic region: Secondary | ICD-10-CM | POA: Diagnosis not present

## 2024-02-17 DIAGNOSIS — M62830 Muscle spasm of back: Secondary | ICD-10-CM | POA: Diagnosis not present

## 2024-02-18 ENCOUNTER — Inpatient Hospital Stay

## 2024-02-18 ENCOUNTER — Inpatient Hospital Stay (HOSPITAL_BASED_OUTPATIENT_CLINIC_OR_DEPARTMENT_OTHER): Admitting: Oncology

## 2024-02-18 ENCOUNTER — Encounter: Payer: Self-pay | Admitting: Oncology

## 2024-02-18 VITALS — BP 120/67 | HR 53 | Temp 97.8°F | Resp 18 | Wt 160.8 lb

## 2024-02-18 DIAGNOSIS — I130 Hypertensive heart and chronic kidney disease with heart failure and stage 1 through stage 4 chronic kidney disease, or unspecified chronic kidney disease: Secondary | ICD-10-CM | POA: Diagnosis not present

## 2024-02-18 DIAGNOSIS — C9001 Multiple myeloma in remission: Secondary | ICD-10-CM | POA: Diagnosis not present

## 2024-02-18 DIAGNOSIS — R300 Dysuria: Secondary | ICD-10-CM

## 2024-02-18 DIAGNOSIS — G620 Drug-induced polyneuropathy: Secondary | ICD-10-CM | POA: Diagnosis not present

## 2024-02-18 DIAGNOSIS — Z9484 Stem cells transplant status: Secondary | ICD-10-CM | POA: Diagnosis not present

## 2024-02-18 DIAGNOSIS — D500 Iron deficiency anemia secondary to blood loss (chronic): Secondary | ICD-10-CM

## 2024-02-18 DIAGNOSIS — G629 Polyneuropathy, unspecified: Secondary | ICD-10-CM

## 2024-02-18 DIAGNOSIS — E1122 Type 2 diabetes mellitus with diabetic chronic kidney disease: Secondary | ICD-10-CM | POA: Diagnosis not present

## 2024-02-18 DIAGNOSIS — N184 Chronic kidney disease, stage 4 (severe): Secondary | ICD-10-CM

## 2024-02-18 DIAGNOSIS — E538 Deficiency of other specified B group vitamins: Secondary | ICD-10-CM | POA: Diagnosis not present

## 2024-02-18 DIAGNOSIS — T451X5A Adverse effect of antineoplastic and immunosuppressive drugs, initial encounter: Secondary | ICD-10-CM | POA: Diagnosis not present

## 2024-02-18 LAB — CMP (CANCER CENTER ONLY)
ALT: 33 U/L (ref 0–44)
AST: 31 U/L (ref 15–41)
Albumin: 4.1 g/dL (ref 3.5–5.0)
Alkaline Phosphatase: 81 U/L (ref 38–126)
Anion gap: 9 (ref 5–15)
BUN: 34 mg/dL — ABNORMAL HIGH (ref 8–23)
CO2: 22 mmol/L (ref 22–32)
Calcium: 9 mg/dL (ref 8.9–10.3)
Chloride: 105 mmol/L (ref 98–111)
Creatinine: 1.49 mg/dL — ABNORMAL HIGH (ref 0.44–1.00)
GFR, Estimated: 38 mL/min — ABNORMAL LOW (ref >60)
Glucose, Bld: 116 mg/dL — ABNORMAL HIGH (ref 70–99)
Potassium: 3.9 mmol/L (ref 3.5–5.1)
Sodium: 136 mmol/L (ref 135–145)
Total Bilirubin: 0.4 mg/dL (ref 0.0–1.2)
Total Protein: 7.1 g/dL (ref 6.5–8.1)

## 2024-02-18 LAB — CBC WITH DIFFERENTIAL (CANCER CENTER ONLY)
Abs Immature Granulocytes: 0.01 K/uL (ref 0.00–0.07)
Basophils Absolute: 0 K/uL (ref 0.0–0.1)
Basophils Relative: 0 %
Eosinophils Absolute: 0.1 K/uL (ref 0.0–0.5)
Eosinophils Relative: 2 %
HCT: 34.8 % — ABNORMAL LOW (ref 36.0–46.0)
Hemoglobin: 11.9 g/dL — ABNORMAL LOW (ref 12.0–15.0)
Immature Granulocytes: 0 %
Lymphocytes Relative: 29 %
Lymphs Abs: 1.4 K/uL (ref 0.7–4.0)
MCH: 29.2 pg (ref 26.0–34.0)
MCHC: 34.2 g/dL (ref 30.0–36.0)
MCV: 85.5 fL (ref 80.0–100.0)
Monocytes Absolute: 0.4 K/uL (ref 0.1–1.0)
Monocytes Relative: 9 %
Neutro Abs: 2.8 K/uL (ref 1.7–7.7)
Neutrophils Relative %: 60 %
Platelet Count: 89 K/uL — ABNORMAL LOW (ref 150–400)
RBC: 4.07 MIL/uL (ref 3.87–5.11)
RDW: 13.4 % (ref 11.5–15.5)
WBC Count: 4.7 K/uL (ref 4.0–10.5)
nRBC: 0 % (ref 0.0–0.2)

## 2024-02-18 LAB — MAGNESIUM: Magnesium: 1.4 mg/dL — ABNORMAL LOW (ref 1.7–2.4)

## 2024-02-18 LAB — VITAMIN B12: Vitamin B-12: 463 pg/mL (ref 180–914)

## 2024-02-18 MED ORDER — SODIUM CHLORIDE 0.9 % IV SOLN
12.5000 mg | Freq: Once | INTRAVENOUS | Status: AC
  Administered 2024-02-18: 12.5 mg via INTRAVENOUS
  Filled 2024-02-18: qty 0.5

## 2024-02-18 MED ORDER — MAGNESIUM SULFATE 4 GM/100ML IV SOLN
4.0000 g | Freq: Once | INTRAVENOUS | Status: AC
  Administered 2024-02-18: 4 g via INTRAVENOUS
  Filled 2024-02-18: qty 100

## 2024-02-18 MED ORDER — CYANOCOBALAMIN 1000 MCG/ML IJ SOLN
1000.0000 ug | Freq: Once | INTRAMUSCULAR | Status: AC
  Administered 2024-02-18: 1000 ug via INTRAMUSCULAR
  Filled 2024-02-18: qty 1

## 2024-02-18 MED ORDER — SODIUM CHLORIDE 0.9 % IV SOLN
Freq: Once | INTRAVENOUS | Status: AC
  Filled 2024-02-18: qty 250

## 2024-02-18 NOTE — Assessment & Plan Note (Signed)
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  Light chain ratio is normal. 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 Repeat immunofixation negative. . Labs reviewed and discussed with patient, stable M protein and stable light chain ratio Off Daratumumab due to frequent infections  Continue acyclovir prophylaxis.

## 2024-02-18 NOTE — Progress Notes (Signed)
 Hematology/Oncology Progress note [phone] [phone]     Chief Complaint: Sarah [name] is a 68 y.o. 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  Light chain ratio is normal. 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 Repeat immunofixation negative. . Labs reviewed and discussed with patient, stable M protein and stable light chain ratio Off Daratumumab  due to frequent infections  Continue acyclovir  prophylaxis.  Iron  deficiency anemia due to chronic blood loss Lab Results  Component Value Date   HGB 11.4 (L) 01/07/2024   TIBC 368 01/07/2024   IRONPCTSAT 16 01/07/2024   FERRITIN 46 01/07/2024    Hb is stable   B12 deficiency continue B12 injections Q 6 weeks.   History of stem cell transplant (HCC) -[facility]- Post transplant RN coordinator [phone]).  Hypomagnesemia #Chronic hypomagnesemia proceed with  IV magnesium  today.  Continue weekly magnesium  +/- IV magnesium .     Neuropathy Grade 2 chemotherapy-induced neuropathy, Previously on Lyrica  and nortriptyline-not effective Neurology recommend Tramadol  and may consider higher dose of  Nortriptyline in the future.  She prefer to be off treatments for now, symptom is stable. .    Stage 4 chronic kidney disease (HCC) avoid nephrotoxins.      Orders Placed This Encounter  Procedures   CMP (Cancer Center only)    Standing Status:   Future    Expected Date:   03/31/2024    Expiration Date:   06/29/2024   CBC with Differential (Cancer Center Only)    Standing Status:   Future     Expected Date:   03/31/2024    Expiration Date:   06/29/2024   Multiple Myeloma Panel (SPEP&IFE w/QIG)    Standing Status:   Future    Expected Date:   03/31/2024    Expiration Date:   06/29/2024   Kappa/lambda light chains    Standing Status:   Future    Expected Date:   03/31/2024    Expiration Date:   06/29/2024   Magnesium     Standing Status:   Future    Expected Date:   03/31/2024    Expiration Date:   06/29/2024    Follow up  Lab Mag weekly x5  + IV mag.  6 weeks flex lab MD IV mag,   All questions were answered. The patient knows to call the clinic with any problems, questions or concerns.  Zelphia Cap, MD, PhD [facility] Hematology Oncology 02/18/2024       PERTINENT ONCOLOGY HISTORY Sarah [name] is a 68 y.o.afemale who has above oncology history reviewed by me today presented for follow up visit for management of multiple myeloma Patient previously followed up by Dr.Corcoran, patient switched care to me on 11/23/20 Extensive medical record review was performed by me  stage III IgA lambda light chain multiple myeloma s/p autologous stem cell transplant on 06/14/2015 at the [facility] [place]  and second autologous stem cell transplant on 05/10/2020 at [facility].   Initial bone marrow revealed 80% plasma cells.  Lambda free light chains were 1340.  She had nephrotic range proteinuria.  She initially underwent induction with RVD.  Revlimid  maintenance was discontinued  on 01/21/2017 secondary to intolerance.     07/19/2019 Bone marrow aspirate and biopsy on  revealed a normocellular marrow with but increased lambda-restricted plasma cells (9% aspirate, 40% CD138 immunohistochemistry).  Findings were consistent with recurrent plasma cell myeloma.  Flow cytometry revealed no monoclonal B-cell or phenotypically aberrant T-cell population. Cytogenetics were 37, XX (normal).  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    Lambda light chains have been followed: 22.2  (ratio 0.56) on 07/03/2017, 30.8 (ratio 0.78) on 09/02/2017, 36.9 (ratio 0.40) on 10/21/2017, 37.4 (ratio 0.41) on 12/16/2017, 70.7(ratio 0.31)  on 02/17/2018, 64.2 (ratio 0.27) on 04/07/2018, 78.9 (ratio 0.18) on 05/26/2018, 128.8 (ratio 0.17) on 08/06/2018, 181.5 (ratio 0.13) on 10/08/2018, 130.9 (ratio 0.13) on 10/20/2018, 160.7 (ratio 0.10) on 12/09/2018, 236.6 (ratio 0.07) on 02/01/2019, 363.6 (ratio 0.04) on 03/22/2019, 404.8 (ratio 0.04) on 04/05/2019, 420.7 (ratio 0.03) on 05/24/2019, 573.4 (ratio 0.03) on 06/23/2019, 451.05 (ratio 0.02) on 08/20/2019, 47.2 (ratio 0.13) on 10/25/2019, 22.4 (ratio 0.21) on 11/25/2019, 16.5 (ratio 0.33) on 01/10/2020, 14.6 (ratio 0.34) on 02/07/2020, 13.1 (ratio 0.31) on 03/07/2020, 10.1 (ratio 0.38) on 04/10/2020, and 9.5 (ratio 0.21) on 06/19/2020.   24 hour UPEP on 06/03/2019 revealed kappa free light chains 95.76, lambda free light chains 1,260.71, and ratio 0.08.  24 hour UPEP on 08/23/2019 revealed total protein of 782 mg/24 hrs with lambda free light chains 1,084.16 mg/L and ratio of 0.10 (1.03-31.76).  M spike in urine was 46.1% (361 mg/24 hrs).    Bone survey on 04/08/2016 and 05/28/2017 revealed no definite lytic lesion seen in the visualized skeleton.  Bone survey on 11/19/2018 revealed no suspicious lucent lesions and no acute bony abnormality.  PET scan on 07/12/2019 revealed no focal metabolic activity to suggest active myeloma within the skeleton. There were no lytic lesions identified on the CT portion of the exam or soft tissue plasmacytomas. There was no evidence of multiple myeloma.    Pretreatment RBC phenotype on 09/23/2019 was positive for C, e, DUFFY B, KIDD B, M, S, and s antigen; negative for c, E, KELL, DUFFY A, KIDD A, and N antigen.    09/27/2019 - 10/25/2019; 12/09/2019 - 03/13/2020 6 cycles of daratumumab  and hyaluronidase -fihj, Pomalyst , and Decadron  (DPd) .  Cycle #1 was complicated by fever and neutropenia requiring admission.  Cycle #2  was complicated by pneumonia requiring admission.  Cycle #6 was complicated with an ER evaluation for an elevated lactic acid.   05/10/2020 second autologous stem cell transplant at [facility]   She underwent conditioning with melphalan 140 mg/m2.    She is s/p week #3 Velcade  maintenance (began 08/31/2020 - 09/28/2020).  She has no increase in neuropathy.  She has severe aortic stenosis.  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 Cardiology is following for a possible TAVR in the future.  Echo on 07/05/2020 revealed moderate to severe aortic stenosis with an EF of 50-55%.  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 Zometa  was discontinued in 01/2017.  She has chronic nausea on Phenergan .     B12 deficiency.  B12 was 254 on 04/09/2017, 295 on 08/20/2018, and 391 on 10/08/2018.  She was on oral B12.  She received B12 monthly (last 09/14/2020).  Folate was 12.1 on 01/10/2020.    She has iron  deficiency.  Ferritin was 32 on 07/01/2019.  She received Venofer  on 07/15/2019 and 07/22/2019.   She has hypogammaglobulinemia.  IgG was 245 on 11/25/2019.  She received monthly IVIG (07/22/021 - 03/22/2020).  She received IVIG 400 mg/kg on 01/20/2020, 200 mg/kg on 02/17/2020, and 300 mg/kg on 03/22/2020.  IVIG on 01/20/2020 was complicated by acute renal failure. IgG trough level was 418 on 02/17/2020.  10/15/2019 - 10/20/2019 She was admitted to [facility] with fever and neutropenia.  Cultures were negative.  CXR was negative. She received broad spectrum antibiotics and daily Granix .  She received IVF for acute renal insufficiency due to diarrhea and dehydration.  Creatine was 1.66 on admission and 1.12 on discharge.    03/01/2020 - 03/05/2020 admitted to [facility] from with fever and neutropenia. CXR revealed no active cardiopulmonary disease. Chest CT with contrast revealed no acute intrathoracic pathology. There were findings  which could be suggestive of prior granulomatous disease. She was treated with Cefepime  and Vancomycin , then switched to ciprofloxacin  on 03/03/2020.   05/08/2020 - 12/05/2021The patient was admitted to [facility] from  for autologous bone marrow transplant.   She received conditioning with melphalan 140 mg/m2.  Bone marrow transplant was on 05/10/2020. The patient developed fevers daily from 05/19/2020 - 05/24/2020. Blood cultures were + for strept sanguis bacteremia.  She was treated cefepime , vancomycin , and solumedrol for possible engraftment syndrome. Cefepime  was switched to ceftriaxone ; she completed 2 weeks of ceftriaxone  on 06/03/2020. She had chemotherapy induced diarrhea.  She experienced urinary retention.  Feb 2022 patient has been on maintenance Velcade  2 mg every 2 weeks.  Chronic diarrhea seen by gastroenterology Dr. Therisa and was recommended to avoid artificial sugars in her diet including Diet Coke and coffee mate.  She feels some improvement of her diarrhea frequency since the dietary modification.  GI profile PCR negative, C. difficile toxin A+ B negative, fecal calprotectin 77 12/19/2020 colonoscopy showed 2 subcentimeter polyps in the ascending colon, resected and removed.  Pathology showed tubular adenoma.  No malignancy.  Normal mucosa in the entire examined colon.  Biopsied, pathology showed colonic mucosa with no significant pathological alteration.  Negative for active inflammation and features of chronicity.  Negative for microscopic colitis, dysplasia, and malignanc  Diabetes, patient has discontinued metformin  due to diarrhea and started on glipizide  2.5 mg   03/05/2021-03/09/2021 Patient was hospitalized due to fever and chills, nonproductive cough, shortness of breath. X-ray showed right lower lobe atelectasis versus pneumonia.  Blood cultures negative.  Urine culture showed 60,000 colonies of Enterococcus facialis.  Patient received antibiotics. Patient also had abdominal pelvis  CT scan for work-up of intermittent lower abdomen pain.No acute abnormality.   05/14/21 last dose of Velcade  maintenance.  establish care with neurology Dr. Lane Hussar and her neuropathy regimen has been switched from gabapentin  to Lyrica . 05/29/2021 patient got influenza   neuropathy medication has been switched from gabapentin /Lyrica  to nortriptyline.  08/18/2021 Hospitalized due to pneumonia from metapnuemovirus, hospitalization was complicated with NSTEMI. She received heparin  gtt.  Treated with antibiotics for coverage of pneumonia,  diuretics for pulmonary edema. She received IVIG 400mg /kg x 1 for hypoglobulinemia. Discharged on 08/25/21 with Augmentin  and Zithromax .   May 2023, Daratumumab  being held for [facility] infectious disease physician Dr. Lorrene due to frequent infection.  Diagnosed with CHF [HFrEF] -NYHA class II-III, she follows up with cardiology, started on entresto   hospitalization due to CHF exacerbation, community acquired pneumonia.   Presented to emergency room on 07/01/2022 Respiratory panel RT-PCR negative for COVID-19, influenza and RSV.  + CHF [nonischemic cardiomyopathy], aortic stenosis, follows up with cardiology. 11/06/2023 s/p TAVR for aortic valve stenosis.  INTERVAL  HISTORY Sarah [name] is a 68 y.o. female who has above history reviewed by me today presents for follow up visit for management of multiple myeloma.  Patient has been on weekly magnesium  infusion as needed hypomagnesemia + new onset lower  back and buttocks, radiating to the groin area No recent injury. She had a chiropractor session yesterday.   Past Medical History:  Diagnosis Date   Anemia    Anxiety    Aortic stenosis    a. 05/2020 Echo: EF 45-50%, sev AS - seen by TAVR team @ [facility] - CTA sugg of tricuspid valve w/ fusing of 2 leaflets-TAVR deferred in setting of acute infxn; b. 07/2020 Echo: EF 50-55%, mod-sev AS; c. 12/2020 Echo: EF>55%. Mod-sev paradoxical low-flow low-gradient AS; d.  08/2021 Echo: EF 35-40%, severe AS, triv AI, mild MR.   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 Variable EF over time; b. 08/2017 Echo: EF 40%; b. 03/2020 Echo: EF 55-60%; c. 05/2020 Echo: EF 45-50%; d. 07/2020 Echo: EF 50-55%; e. 12/2020 Echo: EF>55%; f. 08/2021 Echo: EF 35-40%.   CHF (congestive heart failure) (HCC)    Chronic heart failure with preserved ejection fraction (HFpEF) (HCC)    a. Variable EF over time; b. 08/2017 Echo: EF 40%; b. 03/2020 Echo: EF 55-60%; c. 05/2020 Echo: EF 45-50%; d. 07/2020 Echo: EF 50-55%; e. 12/2020 Echo: EF>55%; f. 08/2021 Echo: EF 35-40%, no RV, mildly dil LA, mild MR, triv AI, severe AS.   Chronic pain syndrome 07/08/2016   CKD (chronic kidney disease) stage 3, GFR 30-59 ml/min (HCC)    Colon cancer (HCC)    Dad   Depression    Diabetes mellitus (HCC)    Dizziness    Fatty liver    Frequent falls    GERD (gastroesophageal reflux disease)    Gout    Heart murmur    History of blood transfusion    History of bone marrow transplant (HCC)    History of uterine fibroid    Hx of cardiac catheterization    a. 01/2016 Cath ([place]  - after abnl nuc): Nl cors.   Hypertension    Hypomagnesemia    IDA (iron  deficiency anemia)    Multiple myeloma (HCC)    NSTEMI (non-ST elevated myocardial infarction) (HCC) 08/21/2021   Personal history of chemotherapy    Pleurisy 04/09/2022   PSVT (paroxysmal supraventricular tachycardia) (HCC)    a. S/p RFCA 1999 ([place] ).   PVC's (premature ventricular contractions)    a. Well-managed w/ bisoprolol  in outpt setting.   Renal cyst    Ulcer 2008   3 ulcers    Past Surgical History:  Procedure Laterality Date   ABDOMINAL HYSTERECTOMY     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   CARDIAC ELECTROPHYSIOLOGY MAPPING AND ABLATION     CARPAL TUNNEL RELEASE Bilateral     CHOLECYSTECTOMY  2008   COLONOSCOPY WITH PROPOFOL  N/A 05/07/2017   Procedure: COLONOSCOPY WITH PROPOFOL ;  Surgeon: Therisa Bi, MD;  Location: [facility] ENDOSCOPY;  Service: Gastroenterology;  Laterality: N/A;   COLONOSCOPY WITH PROPOFOL  N/A 12/19/2020   Procedure: COLONOSCOPY WITH PROPOFOL ;  Surgeon: Therisa Bi, MD;  Location: [facility] ENDOSCOPY;  Service: Gastroenterology;  Laterality: N/A;   ESOPHAGOGASTRODUODENOSCOPY (EGD) WITH PROPOFOL  N/A 05/07/2017   Procedure: ESOPHAGOGASTRODUODENOSCOPY (EGD) WITH PROPOFOL ;  Surgeon: Therisa Bi, MD;  Location: [facility] ENDOSCOPY;  Service: Gastroenterology;  Laterality: N/A;   FOOT SURGERY Bilateral    INCONTINENCE SURGERY  2009   INTERSTIM IMPLANT PLACEMENT     other     over active bladder   OTHER SURGICAL HISTORY     bladder stimulator    PARTIAL HYSTERECTOMY  03/1996   fibroids   PORTA CATH INSERTION N/A 03/10/2019   Procedure: PORTA CATH INSERTION;  Surgeon: Marea Selinda RAMAN, MD;  Location: [facility] INVASIVE CV LAB;  Service: Cardiovascular;  Laterality: N/A;   TONSILLECTOMY  2007   TRANSCATHETER AORTIC VALVE REPLACEMENT, TRANSFEMORAL  11/06/2023   TUBAL LIGATION  1984    Family History  Problem Relation Age of Onset   Colon cancer Father    Renal Disease Father    Diabetes Mellitus II Father    Heart disease Father    Kidney disease Father    Melanoma Paternal Grandmother    Breast cancer Maternal Aunt 98   Anemia Mother    Heart disease Mother    Heart failure Mother    Renal Disease Mother    Congestive Heart Failure Mother    Kidney disease Mother    Heart disease Maternal Uncle    Throat cancer Maternal Uncle    Lung cancer Maternal Uncle    Liver disease Maternal Uncle    Heart failure Maternal Uncle    Hearing loss Son 15       Suicide    Heart disease Son    Hearing loss Sister    Heart disease Son     Social History:  reports that she quit smoking about 32 years ago. Her smoking use included cigarettes. She started smoking  about 52 years ago. She has a 20 pack-year smoking history. She has never used smokeless tobacco. She reports current alcohol use of about 2.0 standard drinks of alcohol per week. She reports that she does not use drugs.  She is on disability. She notes exposure to perchloroethylene [facility]).   Allergies:  Allergies  Allergen Reactions   Oxycodone-Acetaminophen  Anaphylaxis    Swelling and rash  acetaminophen  / oxycodone   Celebrex  [Celecoxib ] Diarrhea   Codeine     Plerixafor     In 2016 during ASCT collection patient developed fever to 103.40F and required hospitalization   Benadryl  [Diphenhydramine ] Palpitations   Morphine  Itching and Rash   Ondansetron  Diarrhea   Tylenol  [Acetaminophen ] Itching and Rash    Current Medications: Current Outpatient Medications  Medication Sig Dispense Refill   allopurinol  (ZYLOPRIM ) 100 MG tablet TAKE 1 TABLET(100 MG) BY MOUTH DAILY as needed 90 tablet 1   amiodarone (PACERONE) 200 MG tablet Take 200 mg by mouth daily.     bisoprolol  (ZEBETA ) 10 MG tablet Take 1 tablet (10 mg total) by mouth daily. 90 tablet 1   celecoxib  (CELEBREX ) 200 MG capsule Take 200 mg by mouth daily.     cholestyramine  (QUESTRAN ) 4 GM/DOSE powder Take 1 packet (4 g total) by mouth 2 (two) times daily with a meal. (Patient taking differently: Take 4 g by mouth 2 (two) times daily as needed.) 240 packet 2   diazepam  (VALIUM ) 5 MG tablet TAKE 1/2 TO 1 TABLET(2.5 TO 5 MG) BY MOUTH AT BEDTIME AS NEEDED FOR INSOMNIA (Patient taking differently: Take 5 mg by mouth every 6 (six) hours as needed.) 30 tablet 5   diclofenac  sodium (VOLTAREN ) 1 % GEL Apply 2 g topically 4 (four) times daily. 100 g 1   diphenoxylate -atropine  (LOMOTIL ) 2.5-0.025 MG  tablet Take 2 tablets by mouth 4 (four) times daily as needed for diarrhea or loose stools. 180 tablet 3   ENTRESTO  24-26 MG Take 1 tablet by mouth 2 (two) times daily.     FLUoxetine  (PROZAC ) 40 MG capsule Take 1 capsule (40 mg total) by mouth daily.  90 capsule 1   furosemide  (LASIX ) 40 MG tablet Take 0.5 tablets (20 mg total) by mouth daily. Home dose.     glipiZIDE  (GLUCOTROL  XL) 2.5 MG 24 hr tablet Take 1 tablet (2.5 mg total) by mouth daily with breakfast. 90 tablet 1   glucose blood test strip      JARDIANCE 10 MG TABS tablet Take 10 mg by mouth daily.     lidocaine  (XYLOCAINE ) 2 % solution Use as directed 15 mLs in the mouth or throat every 3 (three) hours as needed for mouth pain (swish and spit). 100 mL 0   metaxalone  (SKELAXIN ) 800 MG tablet Take 1 tablet (800 mg total) by mouth in the morning. 30 tablet 0   Multiple Vitamin (MULTIVITAMIN) tablet Take 1 tablet by mouth daily.     nystatin  cream (MYCOSTATIN ) Apply 1 Application topically 2 (two) times daily. To rash in abdominal folds. 30 g 0   omeprazole  (PRILOSEC) 40 MG capsule TAKE 1 CAPSULE IN THE MORNING AND TAKE 1 CAPSULE AT BEDTIME 180 capsule 3   promethazine  (PHENERGAN ) 25 MG tablet Take 1 tablet (25 mg total) by mouth every 6 (six) hours as needed for nausea or vomiting. 90 tablet 1   rosuvastatin  (CRESTOR ) 5 MG tablet Take 1 tablet (5 mg total) by mouth daily. 90 tablet 1   spironolactone (ALDACTONE) 25 MG tablet Take by mouth.     tiZANidine  (ZANAFLEX ) 4 MG tablet Take 1 tablet (4 mg total) by mouth at bedtime. 90 tablet 0   traMADol  (ULTRAM ) 50 MG tablet      traZODone  (DESYREL ) 100 MG tablet TAKE 1 TABLET AT BEDTIME 90 tablet 3   VITAMIN D , CHOLECALCIFEROL, PO Take 2,000 Units by mouth daily.     potassium chloride  SA (KLOR-CON  M) 20 MEQ tablet Take 1 tablet (20 mEq total) by mouth daily. (Patient not taking: Reported on 02/18/2024) 7 tablet 0   No current facility-administered medications for this visit.   Facility-Administered Medications Ordered in Other Visits  Medication Dose Route Frequency Provider Last Rate Last Admin   sodium chloride  flush (NS) 0.9 % injection 10 mL  10 mL Intravenous PRN Babara Call, MD   10 mL at 04/02/21 0904   sodium chloride  flush (NS) 0.9  % injection 10 mL  10 mL Intracatheter Once PRN Babara Call, MD        Review of Systems  Constitutional:  Negative for chills, fever, malaise/fatigue and weight loss.  HENT:  Negative for congestion and sore throat.   Eyes:  Negative for redness.  Respiratory:  Negative for cough, shortness of breath and wheezing.   Cardiovascular:  Negative for chest pain, palpitations and leg swelling.  Gastrointestinal:  Positive for diarrhea. Negative for abdominal pain, blood in stool, nausea and vomiting.  Genitourinary:  Positive for dysuria.  Musculoskeletal:  Positive for back pain and joint pain. Negative for myalgias.  Skin:  Negative for rash.  Neurological:  Positive for tingling and sensory change. Negative for dizziness and tremors.  Endo/Heme/Allergies:  Does not bruise/bleed easily.  Psychiatric/Behavioral:  Negative for hallucinations.     Performance status (ECOG): 1  Vitals Blood pressure 120/67, pulse (!) 53, temperature 97.8  F (36.6 C), resp. rate 18, weight 160 lb 12.8 oz (72.9 kg), SpO2 98%.  Physical Exam Constitutional:      General: She is not in acute distress.    Appearance: She is not diaphoretic.  HENT:     Head: Normocephalic and atraumatic.  Eyes:     General: No scleral icterus. Cardiovascular:     Rate and Rhythm: Normal rate.     Heart sounds: Murmur heard.  Pulmonary:     Effort: Pulmonary effort is normal. No respiratory distress.     Breath sounds: Normal breath sounds. No wheezing.  Abdominal:     General: There is no distension.  Musculoskeletal:        General: Normal range of motion.     Cervical back: Normal range of motion and neck supple.     Comments: Bilateral SI joint tenderness.   Skin:    General: Skin is warm and dry.     Findings: No erythema.  Neurological:     Mental Status: She is alert and oriented to person, place, and time. Mental status is at baseline.     Motor: No abnormal muscle tone.  Psychiatric:        Mood and Affect:  Mood and affect normal.      Laboratory data    Latest Ref Rng & Units 02/18/2024    9:35 AM 01/07/2024    8:33 AM 11/26/2023    9:35 AM  CBC  WBC 4.0 - 10.5 K/uL 4.7  4.7  6.3   Hemoglobin 12.0 - 15.0 g/dL 88.0  88.5  88.6   Hematocrit 36.0 - 46.0 % 34.8  33.7  33.7   Platelets 150 - 400 K/uL 89  89  111       Latest Ref Rng & Units 02/18/2024    9:35 AM 01/07/2024    8:33 AM 11/26/2023    9:33 AM  CMP  Glucose 70 - 99 mg/dL 883  852  864   BUN 8 - 23 mg/dL 34  22  22   Creatinine 0.44 - 1.00 mg/dL 8.50  8.76  8.71   Sodium 135 - 145 mmol/L 136  135  135   Potassium 3.5 - 5.1 mmol/L 3.9  4.1  3.6   Chloride 98 - 111 mmol/L 105  106  106   CO2 22 - 32 mmol/L 22  22  20    Calcium  8.9 - 10.3 mg/dL 9.0  9.0  8.6   Total Protein 6.5 - 8.1 g/dL 7.1  7.1  7.1   Total Bilirubin 0.0 - 1.2 mg/dL 0.4  0.4  0.6   Alkaline Phos 38 - 126 U/L 81  87  86   AST 15 - 41 U/L 31  41  24   ALT 0 - 44 U/L 33  39  24

## 2024-02-18 NOTE — Assessment & Plan Note (Signed)
-

## 2024-02-18 NOTE — Assessment & Plan Note (Signed)
 Grade 2 chemotherapy-induced neuropathy, Previously on Lyrica and nortriptyline-not effective Neurology recommend Tramadol and may consider higher dose of  Nortriptyline in the future.  She prefer to be off treatments for now, symptom is stable. Sarah [name]

## 2024-02-18 NOTE — Assessment & Plan Note (Signed)
#  Chronic hypomagnesemia proceed with  IV magnesium today.  Continue weekly magnesium +/- IV magnesium.

## 2024-02-18 NOTE — Assessment & Plan Note (Signed)
-  avoid nephrotoxins

## 2024-02-18 NOTE — Assessment & Plan Note (Signed)
 Lab Results  Component Value Date   HGB 11.4 (L) 01/07/2024   TIBC 368 01/07/2024   IRONPCTSAT 16 01/07/2024   FERRITIN 46 01/07/2024    Hb is stable

## 2024-02-18 NOTE — Assessment & Plan Note (Signed)
 continue B12 injections Q 6 weeks.

## 2024-02-19 LAB — KAPPA/LAMBDA LIGHT CHAINS
Kappa free light chain: 24.1 mg/L — ABNORMAL HIGH (ref 3.3–19.4)
Kappa, lambda light chain ratio: 0.81 (ref 0.26–1.65)
Lambda free light chains: 29.6 mg/L — ABNORMAL HIGH (ref 5.7–26.3)

## 2024-02-20 LAB — MULTIPLE MYELOMA PANEL, SERUM
Albumin SerPl Elph-Mcnc: 4.2 g/dL (ref 2.9–4.4)
Albumin/Glob SerPl: 1.8 — ABNORMAL HIGH (ref 0.7–1.7)
Alpha 1: 0.2 g/dL (ref 0.0–0.4)
Alpha2 Glob SerPl Elph-Mcnc: 0.8 g/dL (ref 0.4–1.0)
B-Globulin SerPl Elph-Mcnc: 0.9 g/dL (ref 0.7–1.3)
Gamma Glob SerPl Elph-Mcnc: 0.5 g/dL (ref 0.4–1.8)
Globulin, Total: 2.4 g/dL (ref 2.2–3.9)
IgA: 272 mg/dL (ref 87–352)
IgG (Immunoglobin G), Serum: 663 mg/dL (ref 586–1602)
IgM (Immunoglobulin M), Srm: 23 mg/dL — ABNORMAL LOW (ref 26–217)
Total Protein ELP: 6.6 g/dL (ref 6.0–8.5)

## 2024-02-24 DIAGNOSIS — M9902 Segmental and somatic dysfunction of thoracic region: Secondary | ICD-10-CM | POA: Diagnosis not present

## 2024-02-24 DIAGNOSIS — M62830 Muscle spasm of back: Secondary | ICD-10-CM | POA: Diagnosis not present

## 2024-02-24 DIAGNOSIS — M5412 Radiculopathy, cervical region: Secondary | ICD-10-CM | POA: Diagnosis not present

## 2024-02-24 DIAGNOSIS — M9901 Segmental and somatic dysfunction of cervical region: Secondary | ICD-10-CM | POA: Diagnosis not present

## 2024-02-25 ENCOUNTER — Inpatient Hospital Stay

## 2024-02-25 VITALS — BP 125/60 | HR 73 | Temp 97.6°F | Resp 18

## 2024-02-25 DIAGNOSIS — E1122 Type 2 diabetes mellitus with diabetic chronic kidney disease: Secondary | ICD-10-CM | POA: Diagnosis not present

## 2024-02-25 DIAGNOSIS — D500 Iron deficiency anemia secondary to blood loss (chronic): Secondary | ICD-10-CM | POA: Diagnosis not present

## 2024-02-25 DIAGNOSIS — N184 Chronic kidney disease, stage 4 (severe): Secondary | ICD-10-CM | POA: Diagnosis not present

## 2024-02-25 DIAGNOSIS — E538 Deficiency of other specified B group vitamins: Secondary | ICD-10-CM | POA: Diagnosis not present

## 2024-02-25 DIAGNOSIS — G620 Drug-induced polyneuropathy: Secondary | ICD-10-CM | POA: Diagnosis not present

## 2024-02-25 DIAGNOSIS — T451X5A Adverse effect of antineoplastic and immunosuppressive drugs, initial encounter: Secondary | ICD-10-CM | POA: Diagnosis not present

## 2024-02-25 DIAGNOSIS — I130 Hypertensive heart and chronic kidney disease with heart failure and stage 1 through stage 4 chronic kidney disease, or unspecified chronic kidney disease: Secondary | ICD-10-CM | POA: Diagnosis not present

## 2024-02-25 DIAGNOSIS — C9001 Multiple myeloma in remission: Secondary | ICD-10-CM

## 2024-02-25 LAB — MAGNESIUM: Magnesium: 1.5 mg/dL — ABNORMAL LOW (ref 1.7–2.4)

## 2024-02-25 MED ORDER — MAGNESIUM SULFATE 4 GM/100ML IV SOLN
4.0000 g | Freq: Once | INTRAVENOUS | Status: AC
  Administered 2024-02-25: 4 g via INTRAVENOUS
  Filled 2024-02-25: qty 100

## 2024-02-25 MED ORDER — IMMUNE GLOBULIN (HUMAN) 20 GM/200ML IV SOLN: 400.0000 mg/kg | Freq: Once | INTRAVENOUS | Status: DC

## 2024-02-25 MED ORDER — SODIUM CHLORIDE 0.9 % IV SOLN
INTRAVENOUS | Status: DC
  Filled 2024-02-25: qty 250

## 2024-02-25 MED ORDER — SODIUM CHLORIDE 0.9 % IV SOLN
12.5000 mg | Freq: Once | INTRAVENOUS | Status: AC
  Administered 2024-02-25: 12.5 mg via INTRAVENOUS
  Filled 2024-02-25: qty 0.5

## 2024-02-26 ENCOUNTER — Telehealth: Payer: Self-pay | Admitting: Internal Medicine

## 2024-02-26 NOTE — Telephone Encounter (Signed)
 Copied from CRM #[id number]. Topic: Medicare AWV >> Feb 26, 2024  2:59 PM Nathanel DEL wrote: Reason for CRM: LVM 02/26/2024 to change office visit to telephone visit due to [facility] working remote for office khc. Please call to confirm change  Nathanel Paschal; Care Guide Ambulatory Clinical Support [place] [facility] Direct Dial: [phone]

## 2024-03-02 DIAGNOSIS — M9901 Segmental and somatic dysfunction of cervical region: Secondary | ICD-10-CM | POA: Diagnosis not present

## 2024-03-02 DIAGNOSIS — M9902 Segmental and somatic dysfunction of thoracic region: Secondary | ICD-10-CM | POA: Diagnosis not present

## 2024-03-02 DIAGNOSIS — M62830 Muscle spasm of back: Secondary | ICD-10-CM | POA: Diagnosis not present

## 2024-03-02 DIAGNOSIS — M5412 Radiculopathy, cervical region: Secondary | ICD-10-CM | POA: Diagnosis not present

## 2024-03-03 ENCOUNTER — Inpatient Hospital Stay

## 2024-03-03 ENCOUNTER — Inpatient Hospital Stay: Attending: Oncology

## 2024-03-03 DIAGNOSIS — J069 Acute upper respiratory infection, unspecified: Secondary | ICD-10-CM | POA: Diagnosis not present

## 2024-03-03 DIAGNOSIS — Z885 Allergy status to narcotic agent status: Secondary | ICD-10-CM | POA: Diagnosis not present

## 2024-03-03 DIAGNOSIS — I350 Nonrheumatic aortic (valve) stenosis: Secondary | ICD-10-CM | POA: Diagnosis not present

## 2024-03-03 DIAGNOSIS — R110 Nausea: Secondary | ICD-10-CM | POA: Insufficient documentation

## 2024-03-03 DIAGNOSIS — Z888 Allergy status to other drugs, medicaments and biological substances status: Secondary | ICD-10-CM | POA: Diagnosis not present

## 2024-03-03 DIAGNOSIS — Z886 Allergy status to analgesic agent status: Secondary | ICD-10-CM | POA: Insufficient documentation

## 2024-03-03 DIAGNOSIS — J209 Acute bronchitis, unspecified: Secondary | ICD-10-CM | POA: Insufficient documentation

## 2024-03-03 DIAGNOSIS — R0982 Postnasal drip: Secondary | ICD-10-CM | POA: Diagnosis not present

## 2024-03-03 DIAGNOSIS — C9001 Multiple myeloma in remission: Secondary | ICD-10-CM | POA: Diagnosis not present

## 2024-03-03 DIAGNOSIS — E1122 Type 2 diabetes mellitus with diabetic chronic kidney disease: Secondary | ICD-10-CM | POA: Insufficient documentation

## 2024-03-03 DIAGNOSIS — I252 Old myocardial infarction: Secondary | ICD-10-CM | POA: Insufficient documentation

## 2024-03-03 DIAGNOSIS — Z9049 Acquired absence of other specified parts of digestive tract: Secondary | ICD-10-CM | POA: Insufficient documentation

## 2024-03-03 DIAGNOSIS — R197 Diarrhea, unspecified: Secondary | ICD-10-CM | POA: Diagnosis not present

## 2024-03-03 DIAGNOSIS — I5032 Chronic diastolic (congestive) heart failure: Secondary | ICD-10-CM | POA: Insufficient documentation

## 2024-03-03 DIAGNOSIS — F32A Depression, unspecified: Secondary | ICD-10-CM | POA: Insufficient documentation

## 2024-03-03 DIAGNOSIS — R0602 Shortness of breath: Secondary | ICD-10-CM | POA: Diagnosis not present

## 2024-03-03 DIAGNOSIS — Z86018 Personal history of other benign neoplasm: Secondary | ICD-10-CM | POA: Diagnosis not present

## 2024-03-03 DIAGNOSIS — I130 Hypertensive heart and chronic kidney disease with heart failure and stage 1 through stage 4 chronic kidney disease, or unspecified chronic kidney disease: Secondary | ICD-10-CM | POA: Insufficient documentation

## 2024-03-03 DIAGNOSIS — Z85038 Personal history of other malignant neoplasm of large intestine: Secondary | ICD-10-CM | POA: Insufficient documentation

## 2024-03-03 DIAGNOSIS — N184 Chronic kidney disease, stage 4 (severe): Secondary | ICD-10-CM | POA: Insufficient documentation

## 2024-03-03 DIAGNOSIS — Z9221 Personal history of antineoplastic chemotherapy: Secondary | ICD-10-CM | POA: Diagnosis not present

## 2024-03-03 DIAGNOSIS — Z90711 Acquired absence of uterus with remaining cervical stump: Secondary | ICD-10-CM | POA: Diagnosis not present

## 2024-03-03 DIAGNOSIS — Z79899 Other long term (current) drug therapy: Secondary | ICD-10-CM | POA: Diagnosis not present

## 2024-03-03 LAB — MAGNESIUM: Magnesium: 1.5 mg/dL — ABNORMAL LOW (ref 1.7–2.4)

## 2024-03-03 MED ORDER — SODIUM CHLORIDE 0.9 % IV SOLN
INTRAVENOUS | Status: DC
  Filled 2024-03-03: qty 250

## 2024-03-03 MED ORDER — SODIUM CHLORIDE 0.9 % IV SOLN
12.5000 mg | Freq: Once | INTRAVENOUS | Status: DC
  Administered 2024-03-03: 12.5 mg via INTRAVENOUS
  Filled 2024-03-03: qty 0.5

## 2024-03-03 MED ORDER — MAGNESIUM SULFATE 4 GM/100ML IV SOLN
4.0000 g | Freq: Once | INTRAVENOUS | Status: AC
  Administered 2024-03-03: 4 g via INTRAVENOUS
  Filled 2024-03-03: qty 100

## 2024-03-10 ENCOUNTER — Inpatient Hospital Stay

## 2024-03-10 VITALS — BP 120/61 | HR 64 | Temp 98.4°F | Resp 16

## 2024-03-10 DIAGNOSIS — C9001 Multiple myeloma in remission: Secondary | ICD-10-CM

## 2024-03-10 LAB — MAGNESIUM: Magnesium: 1.5 mg/dL — ABNORMAL LOW (ref 1.7–2.4)

## 2024-03-10 MED ORDER — MAGNESIUM SULFATE 4 GM/100ML IV SOLN
4.0000 g | Freq: Once | INTRAVENOUS | Status: AC
  Administered 2024-03-10: 4 g via INTRAVENOUS
  Filled 2024-03-10: qty 100

## 2024-03-10 MED ORDER — SODIUM CHLORIDE 0.9 % IV SOLN
INTRAVENOUS | Status: AC
  Filled 2024-03-10: qty 250

## 2024-03-10 MED ORDER — MAGNESIUM SULFATE 2 GM/50ML IV SOLN: 2.0000 g | Freq: Once | INTRAVENOUS | Status: DC

## 2024-03-10 MED ORDER — SODIUM CHLORIDE 0.9 % IV SOLN
12.5000 mg | Freq: Once | INTRAVENOUS | Status: AC
  Administered 2024-03-10: 12.5 mg via INTRAVENOUS
  Filled 2024-03-10: qty 0.5

## 2024-03-10 MED FILL — Promethazine HCl Inj 25 MG/ML: INTRAMUSCULAR | Qty: 0.5 | Status: AC

## 2024-03-11 ENCOUNTER — Other Ambulatory Visit: Payer: Self-pay | Admitting: Internal Medicine

## 2024-03-11 DIAGNOSIS — E118 Type 2 diabetes mellitus with unspecified complications: Secondary | ICD-10-CM

## 2024-03-12 ENCOUNTER — Other Ambulatory Visit: Payer: Self-pay | Admitting: Oncology

## 2024-03-12 DIAGNOSIS — M5412 Radiculopathy, cervical region: Secondary | ICD-10-CM | POA: Diagnosis not present

## 2024-03-12 DIAGNOSIS — M62830 Muscle spasm of back: Secondary | ICD-10-CM | POA: Diagnosis not present

## 2024-03-12 DIAGNOSIS — M9902 Segmental and somatic dysfunction of thoracic region: Secondary | ICD-10-CM | POA: Diagnosis not present

## 2024-03-12 DIAGNOSIS — R110 Nausea: Secondary | ICD-10-CM

## 2024-03-12 DIAGNOSIS — M9901 Segmental and somatic dysfunction of cervical region: Secondary | ICD-10-CM | POA: Diagnosis not present

## 2024-03-12 NOTE — Telephone Encounter (Signed)
 Requested Prescriptions  Pending Prescriptions Disp Refills   glipiZIDE  (GLUCOTROL  XL) 2.5 MG 24 hr tablet [Pharmacy Med Name: GLIPIZIDE  ER 2.5MG  TABLETS] 90 tablet 1    Sig: TAKE 1 TABLET(2.5 MG) BY MOUTH DAILY WITH BREAKFAST     Endocrinology:  Diabetes - Sulfonylureas Failed - 03/12/2024  1:41 PM      Failed - Cr in normal range and within 360 days    Creatinine  Date Value Ref Range Status  02/18/2024 1.49 (H) 0.44 - 1.00 mg/dL Final         Passed - HBA1C is between 0 and 7.9 and within 180 days    Hgb A1c MFr Bld  Date Value Ref Range Status  01/23/2024 6.4 (H) 4.8 - 5.6 % Final    Comment:             Prediabetes: 5.7 - 6.4          Diabetes: >6.4          Glycemic control for adults with diabetes: <7.0          Passed - Valid encounter within last 6 months    Recent Outpatient Visits           1 month ago Benign essential HTN   [facility] at [facility], Leita DEL, MD   3 months ago S/P aortic valve replacement   [facility] at [facility], Leita DEL, MD   4 months ago Amaurosis fugax of left eye   [facility] at [facility], Leita DEL, MD

## 2024-03-16 MED FILL — Promethazine HCl Inj 25 MG/ML: INTRAMUSCULAR | Qty: 0.5 | Status: AC

## 2024-03-17 ENCOUNTER — Inpatient Hospital Stay

## 2024-03-17 VITALS — BP 138/81 | HR 55 | Temp 98.8°F | Resp 16

## 2024-03-17 DIAGNOSIS — C9001 Multiple myeloma in remission: Secondary | ICD-10-CM | POA: Diagnosis not present

## 2024-03-17 LAB — MAGNESIUM: Magnesium: 1.5 mg/dL — ABNORMAL LOW (ref 1.7–2.4)

## 2024-03-17 MED ORDER — MAGNESIUM SULFATE 4 GM/100ML IV SOLN
4.0000 g | Freq: Once | INTRAVENOUS | Status: AC
  Administered 2024-03-17: 4 g via INTRAVENOUS
  Filled 2024-03-17: qty 100

## 2024-03-17 MED ORDER — SODIUM CHLORIDE 0.9 % IV SOLN
INTRAVENOUS | Status: DC
  Filled 2024-03-17: qty 250

## 2024-03-17 MED ORDER — SODIUM CHLORIDE 0.9 % IV SOLN
12.5000 mg | Freq: Once | INTRAVENOUS | Status: AC
  Administered 2024-03-17: 12.5 mg via INTRAVENOUS
  Filled 2024-03-17: qty 0.5

## 2024-03-18 ENCOUNTER — Ambulatory Visit: Payer: Self-pay | Admitting: Emergency Medicine

## 2024-03-18 VITALS — Ht 62.5 in | Wt 165.0 lb

## 2024-03-18 DIAGNOSIS — Z0000 Encounter for general adult medical examination without abnormal findings: Secondary | ICD-10-CM

## 2024-03-18 DIAGNOSIS — Z7984 Long term (current) use of oral hypoglycemic drugs: Secondary | ICD-10-CM

## 2024-03-18 NOTE — Patient Instructions (Signed)
 Ms. Glace,  Thank you for taking the time for your Medicare Wellness Visit. I appreciate your continued commitment to your health goals. Please review the care plan we discussed, and feel free to reach out if I can assist you further.  Medicare recommends these wellness visits once per year to help you and your care team stay ahead of potential health issues. These visits are designed to focus on prevention, allowing your provider to concentrate on managing your acute and chronic conditions during your regular appointments.  Please note that Annual Wellness Visits do not include a physical exam. Some assessments may be limited, especially if the visit was conducted virtually. If needed, we may recommend a separate in-person follow-up with your provider.  Ongoing Care Seeing your primary care provider every 3 to 6 months helps us  monitor your health and provide consistent, personalized care.   Referrals If a referral was made during today's visit and you haven't received any updates within two weeks, please contact the referred provider directly to check on the status. I have placed a referral to [facility]. Their ph# is [phone].  Recommended Screenings:  Get the flu and covid vaccines at your local pharmacy at your convenience.   Please call to schedule your mammogram:  [facility] at [facility] Address: [address], [place], [place] Phone: [phone]  [facility] [address], [address] [place], [place] [address] Phone: [phone]    Health Maintenance  Topic Date Due   Breast Cancer Screening  12/04/2023   COVID-19 Vaccine (6 - Pfizer risk 2024-25 season) 03/01/2024   Flu Shot  04/13/2024*   Hemoglobin A1C  07/25/2024   Eye exam for diabetics  01/15/2025   Yearly kidney health urinalysis for diabetes  01/22/2025   Complete foot exam   01/22/2025   Yearly kidney function blood  test for diabetes  02/17/2025   Medicare Annual Wellness Visit  03/18/2025   Colon Cancer Screening  12/20/2027   DEXA scan (bone density measurement)  09/29/2028   DTaP/Tdap/Td vaccine (8 - Td or Tdap) 01/23/2032   Pneumococcal Vaccine for age over 47  Completed   Hepatitis C Screening  Completed   Zoster (Shingles) Vaccine  Completed   HPV Vaccine  Aged Out   Meningitis B Vaccine  Aged Out   Hepatitis B Vaccine  Discontinued  *Topic was postponed. The date shown is not the original due date.       03/18/2024   10:23 AM  Advanced Directives  Does Patient Have a Medical Advance Directive? Yes  Type of Advance Directive Healthcare Power of Attorney  Does patient want to make changes to medical advance directive? No - Patient declined  Copy of Healthcare Power of Attorney in Chart? No - copy requested   Advance Care Planning is important because it: Ensures you receive medical care that aligns with your values, goals, and preferences. Provides guidance to your family and loved ones, reducing the emotional burden of decision-making during critical moments.  Vision: Annual vision screenings are recommended for early detection of glaucoma, cataracts, and diabetic retinopathy. These exams can also reveal signs of chronic conditions such as diabetes and high blood pressure.  Dental: Annual dental screenings help detect early signs of oral cancer, gum disease, and other conditions linked to overall health, including heart disease and diabetes.  Please see the attached documents for additional preventive care recommendations.   Fall Prevention in the Home, Adult  Falls can cause injuries and affect people of all ages. There are many simple things that you can do to make your home safe and to help prevent falls. If you need it, ask for help making these changes. What actions can I take to prevent falls? General information Use good lighting in all rooms. Make sure to: Replace any light  bulbs that burn out. Turn on lights if it is dark and use night-lights. Keep items that you use often in easy-to-reach places. Lower the shelves around your home if needed. Move furniture so that there are clear paths around it. Do not keep throw rugs or other things on the floor that can make you trip. If any of your floors are uneven, fix them. Add color or contrast paint or tape to clearly mark and help you see: Grab bars or handrails. First and last steps of staircases. Where the edge of each step is. If you use a ladder or stepladder: Make sure that it is fully opened. Do not climb a closed ladder. Make sure the sides of the ladder are locked in place. Have someone hold the ladder while you use it. Know where your pets are as you move through your home. What can I do in the bathroom?     Keep the floor dry. Clean up any water that is on the floor right away. Remove soap buildup in the bathtub or shower. Buildup makes bathtubs and showers slippery. Use non-skid mats or decals on the floor of the bathtub or shower. Attach bath mats securely with double-sided, non-slip rug tape. If you need to sit down while you are in the shower, use a non-slip stool. Install grab bars by the toilet and in the bathtub and shower. Do not use towel bars as grab bars. What can I do in the bedroom? Make sure that you have a light by your bed that is easy to reach. Do not use any sheets or blankets on your bed that hang to the floor. Have a firm bench or chair with side arms that you can use for support when you get dressed. What can I do in the kitchen? Clean up any spills right away. If you need to reach something above you, use a sturdy step stool that has a grab bar. Keep electrical cables out of the way. Do not use floor polish or wax that makes floors slippery. What can I do with my stairs? Do not leave anything on the stairs. Make sure that you have a light switch at the top and the bottom of  the stairs. Have them installed if you do not have them. Make sure that there are handrails on both sides of the stairs. Fix handrails that are broken or loose. Make sure that handrails are as long as the staircases. Install non-slip stair treads on all stairs in your home if they do not have carpet. Avoid having throw rugs at the top or bottom of stairs, or secure the rugs with carpet tape to prevent them from moving. Choose a carpet design that does not hide the edge of steps on the stairs. Make sure that carpet is firmly attached to the stairs. Fix any carpet that is loose or worn. What can I do on the outside of my home? Use bright outdoor lighting. Repair the edges of walkways and driveways and fix any cracks. Clear paths of anything that can make you trip, such as tools or rocks. Add color or contrast paint or  tape to clearly mark and help you see high doorway thresholds. Trim any bushes or trees on the main path into your home. Check that handrails are securely fastened and in good repair. Both sides of all steps should have handrails. Install guardrails along the edges of any raised decks or porches. Have leaves, snow, and ice cleared regularly. Use sand, salt, or ice melt on walkways during winter months if you live where there is ice and snow. In the garage, clean up any spills right away, including grease or oil spills. What other actions can I take? Review your medicines with your health care provider. Some medicines can make you confused or feel dizzy. This can increase your chance of falling. Wear closed-toe shoes that fit well and support your feet. Wear shoes that have rubber soles and low heels. Use a cane, walker, scooter, or crutches that help you move around if needed. Talk with your provider about other ways that you can decrease your risk of falls. This may include seeing a physical therapist to learn to do exercises to improve movement and strength. Where to find more  information Centers for Disease Control and Prevention, [employer]: TonerPromos.no [employer] on Aging: BaseRingTones.pl National Institute on Aging: BaseRingTones.pl Contact a health care provider if: You are afraid of falling at home. You feel weak, drowsy, or dizzy at home. You fall at home. Get help right away if you: Lose consciousness or have trouble moving after a fall. Have a fall that causes a head injury. These symptoms may be an emergency. Get help right away. Call 911. Do not wait to see if the symptoms will go away. Do not drive yourself to the hospital. This information is not intended to replace advice given to you by your health care provider. Make sure you discuss any questions you have with your health care provider. Document Revised: 02/18/2022 Document Reviewed: 02/18/2022 Elsevier Patient Education  2024 Elsevier Inc.   Managing Pain Without Opioids Opioids are strong medicines used to treat moderate to severe pain. For some people, especially those who have long-term (chronic) pain, opioids may not be the best choice for pain management due to: Side effects like nausea, constipation, and sleepiness. The risk of addiction (opioid use disorder). The longer you take opioids, the greater your risk of addiction. Pain that lasts for more than 3 months is called chronic pain. Managing chronic pain usually requires more than one approach and is often provided by a team of health care providers working together (multidisciplinary approach). Pain management may be done at a pain management center or pain clinic. How to manage pain without the use of opioids Use non-opioid medicines Non-opioid medicines for pain may include: Over-the-counter or prescription non-steroidal anti-inflammatory drugs (NSAIDs). These may be the first medicines used for pain. They work well for muscle and bone pain, and they reduce swelling. Acetaminophen . This over-the-counter medicine may work well for milder  pain but not swelling. Antidepressants. These may be used to treat chronic pain. A certain type of antidepressant (tricyclics) is often used. These medicines are given in lower doses for pain than when used for depression. Anticonvulsants. These are usually used to treat seizures but may also reduce nerve (neuropathic) pain. Muscle relaxants. These relieve pain caused by sudden muscle tightening (spasms). You may also use a pain medicine that is applied to the skin as a patch, cream, or gel (topical analgesic), such as a numbing medicine. These may cause fewer side effects than medicines taken by mouth.  Do certain therapies as directed Some therapies can help with pain management. They include: Physical therapy. You will do exercises to gain strength and flexibility. A physical therapist may teach you exercises to move and stretch parts of your body that are weak, stiff, or painful. You can learn these exercises at physical therapy visits and practice them at home. Physical therapy may also involve: Massage. Heat wraps or applying heat or cold to affected areas. Electrical signals that interrupt pain signals (transcutaneous electrical nerve stimulation, TENS). Weak lasers that reduce pain and swelling (low-level laser therapy). Signals from your body that help you learn to regulate pain (biofeedback). Occupational therapy. This helps you to learn ways to function at home and work with less pain. Recreational therapy. This involves trying new activities or hobbies, such as a physical activity or drawing. Mental health therapy, including: Cognitive behavioral therapy (CBT). This helps you learn coping skills for dealing with pain. Acceptance and commitment therapy (ACT) to change the way you think and react to pain. Relaxation therapies, including muscle relaxation exercises and mindfulness-based stress reduction. Pain management counseling. This may be individual, family, or group  counseling.  Receive medical treatments Medical treatments for pain management include: Nerve block injections. These may include a pain blocker and anti-inflammatory medicines. You may have injections: Near the spine to relieve chronic back or neck pain. Into joints to relieve back or joint pain. Into nerve areas that supply a painful area to relieve body pain. Into muscles (trigger point injections) to relieve some painful muscle conditions. A medical device placed near your spine to help block pain signals and relieve nerve pain or chronic back pain (spinal cord stimulation device). Acupuncture. Follow these instructions at home Medicines Take over-the-counter and prescription medicines only as told by your health care provider. If you are taking pain medicine, ask your health care providers about possible side effects to watch out for. Do not drive or use heavy machinery while taking prescription opioid pain medicine. Lifestyle  Do not use drugs or alcohol to reduce pain. If you drink alcohol, limit how much you have to: 0-1 drink a day for women who are not pregnant. 0-2 drinks a day for men. Know how much alcohol is in a drink. In the [place] one drink equals one 12 oz bottle of beer (355 mL), one 5 oz glass of wine (148 mL), or one 1 oz glass of hard liquor (44 mL). Do not use any products that contain nicotine or tobacco. These products include cigarettes, chewing tobacco, and vaping devices, such as e-cigarettes. If you need help quitting, ask your health care provider. Eat a healthy diet and maintain a healthy weight. Poor diet and excess weight may make pain worse. Eat foods that are high in fiber. These include fresh fruits and vegetables, whole grains, and beans. Limit foods that are high in fat and processed sugars, such as fried and sweet foods. Exercise regularly. Exercise lowers stress and may help relieve pain. Ask your health care provider what activities and exercises  are safe for you. If your health care provider approves, join an exercise class that combines movement and stress reduction. Examples include yoga and tai chi. Get enough sleep. Lack of sleep may make pain worse. Lower stress as much as possible. Practice stress reduction techniques as told by your therapist. General instructions Work with all your pain management providers to find the treatments that work best for you. You are an important member of your pain management team. There  are many things you can do to reduce pain on your own. Consider joining an online or in-person support group for people who have chronic pain. Keep all follow-up visits. This is important. Where to find more information You can find more information about managing pain without opioids from: American Academy of Pain Medicine: painmed.org Institute for Chronic Pain: instituteforchronicpain.org American Chronic Pain Association: theacpa.org Contact a health care provider if: You have side effects from pain medicine. Your pain gets worse or does not get better with treatments or home therapy. You are struggling with anxiety or depression. Summary Many types of pain can be managed without opioids. Chronic pain may respond better to pain management without opioids. Pain is best managed when you and a team of health care providers work together. Pain management without opioids may include non-opioid medicines, medical treatments, physical therapy, mental health therapy, and lifestyle changes. Tell your health care providers if your pain gets worse or is not being managed well enough. This information is not intended to replace advice given to you by your health care provider. Make sure you discuss any questions you have with your health care provider. Document Revised: 09/27/2020 Document Reviewed: 09/27/2020 Elsevier Patient Education  2024 [employer].

## 2024-03-18 NOTE — Progress Notes (Signed)
 Subjective:   Sarah [name] is a 68 y.o. who presents for a Medicare Wellness preventive visit.  As a reminder, Annual Wellness Visits don't include a physical exam, and some assessments may be limited, especially if this visit is performed virtually. We may recommend an in-person follow-up visit with your provider if needed.  Visit Complete: Virtual I connected with  Sarah [name] on 03/18/24 by a audio enabled telemedicine application and verified that I am speaking with the correct person using two identifiers.  Patient Location: Home  Provider Location: Home Office  I discussed the limitations of evaluation and management by telemedicine. The patient expressed understanding and agreed to proceed.  Vital Signs: Because this visit was a virtual/telehealth visit, some criteria may be missing or patient reported. Any vitals not documented were not able to be obtained and vitals that have been documented are patient reported.  VideoDeclined- This patient declined Librarian, academic. Therefore the visit was completed with audio only.  Persons Participating in Visit: Patient.  AWV Questionnaire: No: Patient Medicare AWV questionnaire was not completed prior to this visit.  Cardiac Risk Factors include: advanced age (>76men, >77 women);diabetes mellitus;dyslipidemia;hypertension;Other (see comment), Risk factor comments: CAD     Objective:    Today's Vitals   03/18/24 1001 03/18/24 1002  Weight: 165 lb (74.8 kg)   Height: 5' 2.5 (1.588 m)   PainSc:  5    Body mass index is 29.7 kg/m.     03/18/2024   10:23 AM 02/18/2024    9:34 AM 01/07/2024   10:17 AM 11/26/2023   10:03 AM 08/01/2023    9:00 AM 03/11/2023    8:28 AM 02/27/2023    8:47 AM  Advanced Directives  Does Patient Have a Medical Advance Directive? Yes No No Yes No No No  Type of Advance Directive Healthcare Power of [employer] Power of Attorney       Does patient want to make  changes to medical advance directive? No - Patient declined    No - Patient declined  No - Patient declined  Copy of Healthcare Power of Attorney in Chart? No - copy requested No - copy requested       Would patient like information on creating a medical advance directive?  No - Patient declined   No - Patient declined No - Patient declined No - Patient declined    Current Medications (verified) Outpatient Encounter Medications as of 03/18/2024  Medication Sig   allopurinol  (ZYLOPRIM ) 100 MG tablet TAKE 1 TABLET(100 MG) BY MOUTH DAILY as needed   amiodarone (PACERONE) 200 MG tablet Take 200 mg by mouth daily.   bisoprolol  (ZEBETA ) 10 MG tablet Take 1 tablet (10 mg total) by mouth daily.   cholestyramine  (QUESTRAN ) 4 GM/DOSE powder Take 1 packet (4 g total) by mouth 2 (two) times daily with a meal. (Patient taking differently: Take 4 g by mouth 2 (two) times daily with a meal. Twice a day prn)   diazepam  (VALIUM ) 5 MG tablet TAKE 1/2 TO 1 TABLET(2.5 TO 5 MG) BY MOUTH AT BEDTIME AS NEEDED FOR INSOMNIA (Patient taking differently: Take 5 mg by mouth every 6 (six) hours as needed.)   diclofenac  sodium (VOLTAREN ) 1 % GEL Apply 2 g topically 4 (four) times daily.   diphenoxylate -atropine  (LOMOTIL ) 2.5-0.025 MG tablet Take 2 tablets by mouth 4 (four) times daily as needed for diarrhea or loose stools.   ENTRESTO  24-26 MG Take 1 tablet by mouth 2 (two) times daily.  FLUoxetine  (PROZAC ) 40 MG capsule Take 1 capsule (40 mg total) by mouth daily.   fluticasone  (FLONASE ) 50 MCG/ACT nasal spray Place 2 sprays into both nostrils daily.   furosemide  (LASIX ) 40 MG tablet Take 0.5 tablets (20 mg total) by mouth daily. Home dose.   glipiZIDE  (GLUCOTROL  XL) 2.5 MG 24 hr tablet TAKE 1 TABLET(2.5 MG) BY MOUTH DAILY WITH BREAKFAST   JARDIANCE 10 MG TABS tablet Take 10 mg by mouth daily.   metaxalone  (SKELAXIN ) 800 MG tablet Take 1 tablet (800 mg total) by mouth in the morning. (Patient taking differently: Take 800 mg  by mouth in the morning. PRN)   Multiple Vitamin (MULTIVITAMIN) tablet Take 1 tablet by mouth daily.   nystatin  cream (MYCOSTATIN ) Apply 1 Application topically 2 (two) times daily. To rash in abdominal folds. (Patient taking differently: Apply 1 Application topically 2 (two) times daily. To rash in abdominal folds. PRN)   omeprazole  (PRILOSEC) 40 MG capsule TAKE 1 CAPSULE IN THE MORNING AND TAKE 1 CAPSULE AT BEDTIME   potassium chloride  SA (KLOR-CON  M) 20 MEQ tablet Take 1 tablet (20 mEq total) by mouth daily.   promethazine  (PHENERGAN ) 25 MG tablet TAKE 1 TABLET(25 MG) BY MOUTH EVERY 6 HOURS AS NEEDED FOR NAUSEA OR VOMITING   rosuvastatin  (CRESTOR ) 5 MG tablet Take 1 tablet (5 mg total) by mouth daily.   spironolactone (ALDACTONE) 25 MG tablet Take by mouth.   tiZANidine  (ZANAFLEX ) 4 MG tablet Take 1 tablet (4 mg total) by mouth at bedtime.   traMADol  (ULTRAM ) 50 MG tablet  (Patient taking differently: PRN)   traZODone  (DESYREL ) 100 MG tablet TAKE 1 TABLET AT BEDTIME (Patient taking differently: Every other day PRN)   VITAMIN D , CHOLECALCIFEROL, PO Take 2,000 Units by mouth daily.   celecoxib  (CELEBREX ) 200 MG capsule Take 200 mg by mouth daily. (Patient not taking: Reported on 03/18/2024)   glucose blood test strip  (Patient not taking: Reported on 03/18/2024)   lidocaine  (XYLOCAINE ) 2 % solution Use as directed 15 mLs in the mouth or throat every 3 (three) hours as needed for mouth pain (swish and spit). (Patient not taking: Reported on 03/18/2024)   Facility-Administered Encounter Medications as of 03/18/2024  Medication   sodium chloride  flush (NS) 0.9 % injection 10 mL   sodium chloride  flush (NS) 0.9 % injection 10 mL    Allergies (verified) Other, Oxycodone-acetaminophen , Celebrex  [celecoxib ], Codeine , Plerixafor, Benadryl  [diphenhydramine ], Morphine , Ondansetron , and Tylenol  [acetaminophen ]   History: Past Medical History:  Diagnosis Date   Anemia    Anxiety    Aortic stenosis     a. 05/2020 Echo: EF 45-50%, sev AS - seen by TAVR team @ [facility] - CTA sugg of tricuspid valve w/ fusing of 2 leaflets-TAVR deferred in setting of acute infxn; b. 07/2020 Echo: EF 50-55%, mod-sev AS; c. 12/2020 Echo: EF>55%. Mod-sev paradoxical low-flow low-gradient AS; d. 08/2021 Echo: EF 35-40%, severe AS, triv AI, mild MR.   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 Variable EF over time; b. 08/2017 Echo: EF 40%; b. 03/2020 Echo: EF 55-60%; c. 05/2020 Echo: EF 45-50%; d. 07/2020 Echo: EF 50-55%; e. 12/2020 Echo: EF>55%; f. 08/2021 Echo: EF 35-40%.   CHF (congestive heart failure) (HCC)    Chronic heart failure with preserved ejection fraction (HFpEF) (HCC)  a. Variable EF over time; b. 08/2017 Echo: EF 40%; b. 03/2020 Echo: EF 55-60%; c. 05/2020 Echo: EF 45-50%; d. 07/2020 Echo: EF 50-55%; e. 12/2020 Echo: EF>55%; f. 08/2021 Echo: EF 35-40%, no RV, mildly dil LA, mild MR, triv AI, severe AS.   Chronic pain syndrome 07/08/2016   CKD (chronic kidney disease) stage 3, GFR 30-59 ml/min (HCC)    Colon cancer (HCC)    Dad   Depression    Diabetes mellitus (HCC)    Dizziness    Fatty liver    Frequent falls    GERD (gastroesophageal reflux disease)    Gout    Heart murmur    History of blood transfusion    History of bone marrow transplant (HCC)    History of uterine fibroid    Hx of cardiac catheterization    a. 01/2016 Cath ([place]  - after abnl nuc): Nl cors.   Hypertension    Hypomagnesemia    IDA (iron  deficiency anemia)    Multiple myeloma (HCC)    NSTEMI (non-ST elevated myocardial infarction) (HCC) 08/21/2021   Personal history of chemotherapy    Pleurisy 04/09/2022   PSVT (paroxysmal supraventricular tachycardia) (HCC)    a. S/p RFCA 1999 ([place] ).   PVC's (premature ventricular contractions)    a. Well-managed w/ bisoprolol   in outpt setting.   Renal cyst    Ulcer 2008   3 ulcers   Past Surgical History:  Procedure Laterality Date   ABDOMINAL HYSTERECTOMY     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   CARDIAC ELECTROPHYSIOLOGY MAPPING AND ABLATION     CARPAL TUNNEL RELEASE Bilateral    CHOLECYSTECTOMY  2008   COLONOSCOPY WITH PROPOFOL  N/A 05/07/2017   Procedure: COLONOSCOPY WITH PROPOFOL ;  Surgeon: Therisa Bi, MD;  Location[facility] ENDOSCOPY;  Service: Gastroenterology;  Laterality: N/A;   COLONOSCOPY WITH PROPOFOL  N/A 12/19/2020   Procedure: COLONOSCOPY WITH PROPOFOL ;  Surgeon: Therisa Bi, MD;  Location: [facility] ENDOSCOPY;  Service: Gastroenterology;  Laterality: N/A;   ESOPHAGOGASTRODUODENOSCOPY (EGD) WITH PROPOFOL  N/A 05/07/2017   Procedure: ESOPHAGOGASTRODUODENOSCOPY (EGD) WITH PROPOFOL ;  Surgeon: Therisa Bi, MD;  Location: [facility] ENDOSCOPY;  Service: Gastroenterology;  Laterality: N/A;   FOOT SURGERY Bilateral    INCONTINENCE SURGERY  2009   INTERSTIM IMPLANT PLACEMENT     other     over active bladder   OTHER SURGICAL HISTORY     bladder stimulator    PARTIAL HYSTERECTOMY  03/1996   fibroids   PORTA CATH INSERTION N/A 03/10/2019   Procedure: PORTA CATH INSERTION;  Surgeon: Marea Selinda RAMAN, MD;  Location: [facility] INVASIVE CV LAB;  Service: Cardiovascular;  Laterality: N/A;   TONSILLECTOMY  2007   TRANSCATHETER AORTIC VALVE REPLACEMENT, TRANSFEMORAL  11/06/2023   TUBAL LIGATION  1984   Family History  Problem Relation Age of Onset   Colon cancer Father    Renal Disease Father    Diabetes Mellitus II Father    Heart disease Father    Kidney disease Father    Melanoma Paternal Grandmother    Breast cancer Maternal Aunt 68   Anemia Mother    Heart disease Mother    Heart failure Mother    Renal Disease Mother    Congestive Heart Failure Mother    Kidney disease Mother    Heart disease Maternal Uncle    Throat cancer Maternal Uncle    Lung cancer Maternal Uncle    Liver disease Maternal Uncle    Heart  failure Maternal Uncle  Hearing loss Son 18       Suicide    Heart disease Son    Hearing loss Sister    Heart disease Son    Social History   Socioeconomic History   Marital status: Widowed    Spouse name: Not on file   Number of children: 2   Years of education: Not on file   Highest education level: Not on file  Occupational History   Occupation: Disabled  Tobacco Use   Smoking status: Former    Current packs/day: 0.00    Average packs/day: 1 pack/day for 20.0 years (20.0 ttl pk-yrs)    Types: Cigarettes    Start date: 07/02/1971    Quit date: 07/02/1991    Years since quitting: 32.7   Smokeless tobacco: Never  Vaping Use   Vaping status: Never Used  Substance and Sexual Activity   Alcohol use: Not Currently    Alcohol/week: 4.0 standard drinks of alcohol    Types: 4 Standard drinks or equivalent per week    Comment: 1-2 wine coolers 2-3 times per week   Drug use: No   Sexual activity: Never  Other Topics Concern   Not on file  Social History Narrative   Pt lives with her 2 sisters, 2 children deceased   Social Drivers of Corporate investment banker Strain: Medium Risk (03/18/2024)   Overall Financial Resource Strain (CARDIA)    Difficulty of Paying Living Expenses: Somewhat hard  Food Insecurity: No Food Insecurity (03/18/2024)   Hunger Vital Sign    Worried About Running Out of Food in the Last Year: Never true    Ran Out of Food in the Last Year: Never true  Transportation Needs: No Transportation Needs (03/18/2024)   PRAPARE - Administrator, Civil Service (Medical): No    Lack of Transportation (Non-Medical): No  Physical Activity: Insufficiently Active (03/18/2024)   Exercise Vital Sign    Days of Exercise per Week: 4 days    Minutes of Exercise per Session: 20 min  Stress: No Stress Concern Present (03/18/2024)   [employer] of Occupational Health - Occupational Stress Questionnaire    Feeling of Stress: Only a little  Social  Connections: Socially Isolated (03/18/2024)   Social Connection and Isolation Panel    Frequency of Communication with Friends and Family: Twice a week    Frequency of Social Gatherings with Friends and Family: More than three times a week    Attends Religious Services: Never    Database administrator or Organizations: No    Attends Banker Meetings: Never    Marital Status: Widowed    Tobacco Counseling Counseling given: Not Answered    Clinical Intake:  Pre-visit preparation completed: Yes  Pain : 0-10 Pain Score: 5  Pain Type: Acute pain Pain Location: Throat Pain Descriptors / Indicators: Sore Pain Onset: In the past 7 days     BMI - recorded: 29.7 Nutritional Status: BMI 25 -29 Overweight Nutritional Risks: Nausea/ vomitting/ diarrhea (chronic nausea) Diabetes: Yes CBG done?: No Did pt. bring in CBG monitor from home?: No  Lab Results  Component Value Date   HGBA1C 6.4 (H) 01/23/2024   HGBA1C 6.2 (A) 06/02/2023   HGBA1C 6.5 (H) 12/11/2022     How often do you need to have someone help you when you read instructions, pamphlets, or other written materials from your doctor or pharmacy?: 1 - Never  Interpreter Needed?: No  Information entered by ::  Vina Ned, CMA   Activities of Daily Living     03/18/2024   10:06 AM  In your present state of health, do you have any difficulty performing the following activities:  Hearing? 0  Vision? 0  Difficulty concentrating or making decisions? 0  Walking or climbing stairs? 0  Dressing or bathing? 0  Doing errands, shopping? 0  Preparing Food and eating ? N  Using the Toilet? N  In the past six months, have you accidently leaked urine? Y  Comment wears pad  Do you have problems with loss of bowel control? N  Managing your Medications? N  Managing your Finances? N  Housekeeping or managing your Housekeeping? N    Patient Care Team: Justus Leita DEL, MD as PCP - General (Internal Medicine) Babara Call, MD as Consulting Physician (Oncology) Berneda Lonni MATSU, MD as Referring Physician (Ophthalmology) Barnet Donnice Righter, MD as Referring Physician (Cardiology) Beschel Chropractor Lateef, Munsoor, MD (Nephrology) Therisa Bi, MD as Consulting Physician (Gastroenterology)  I have updated your Care Teams any recent Medical Services you may have received from other providers in the past year.     Assessment:   This is a routine wellness examination for [place].  Hearing/Vision screen Hearing Screening - Comments:: Denies hearing loss  Vision Screening - Comments:: Gets DM eye exams, Dr. Lonni Berneda, [place]   Goals Addressed             This Visit's Progress    Patient Stated       Lose 30 lbs       Depression Screen     03/18/2024   10:21 AM 03/17/2024   10:33 AM 03/10/2024   10:00 AM 02/04/2024   10:00 AM 01/28/2024   10:00 AM 01/23/2024    9:41 AM 01/22/2024    9:43 AM  PHQ 2/9 Scores  PHQ - 2 Score 0 0 0 0 0 0 0  PHQ- 9 Score 0 0 0 1 1 7      Fall Risk     03/18/2024   10:25 AM 01/23/2024    9:41 AM 11/14/2023    1:17 PM 10/24/2023    1:51 PM 06/02/2023    3:37 PM  Fall Risk   Falls in the past year? 1 0 1 1 0  Number falls in past yr: 1 0 1 1 0  Injury with Fall? 0 0 0 0 0  Risk for fall due to : History of fall(s);Impaired balance/gait;Orthopedic patient No Fall Risks History of fall(s) History of fall(s) No Fall Risks  Follow up Falls evaluation completed;Education provided Falls evaluation completed Falls evaluation completed Falls evaluation completed Falls evaluation completed    MEDICARE RISK AT HOME:  Medicare Risk at Home Any stairs in or around the home?: Yes If so, are there any without handrails?: No Home free of loose throw rugs in walkways, pet beds, electrical cords, etc?: Yes Adequate lighting in your home to reduce risk of falls?: Yes Life alert?: No Use of a cane, walker or w/c?: No Grab bars in the bathroom?:  No Shower chair or bench in shower?: No Elevated toilet seat or a handicapped toilet?: No  TIMED UP AND GO:  Was the test performed?  No  Cognitive Function: 6CIT completed        03/18/2024   10:27 AM 03/11/2023    8:31 AM 03/01/2022    8:30 AM  6CIT Screen  What Year? 0 points  0 points  What month?  0 points 0 points 0 points  What time? 0 points 0 points 0 points  Count back from 20 0 points 0 points 0 points  Months in reverse 0 points 0 points 0 points  Repeat phrase 4 points 4 points 0 points  Total Score 4 points  0 points    Immunizations Immunization History  Administered Date(s) Administered    sv, Bivalent, Protein Subunit Rsvpref,pf (Abrysvo) 05/09/2022   DTaP / Hep B / IPV 11/14/2020, 07/24/2021, 01/22/2022   Fluad Quad(high Dose 65+) 03/13/2021   HIB (PRP-T) 07/09/2016, 09/10/2016, 07/08/2017, 11/14/2020, 07/24/2021, 01/22/2022   Hepatitis B, ADULT 07/09/2016, 09/10/2016, 07/08/2017   INFLUENZA, HIGH DOSE SEASONAL PF 03/27/2022, 03/05/2023   IPV 07/09/2016, 09/10/2016, 07/08/2017   Influenza,inj,Quad PF,6+ Mos 03/23/2015, 06/18/2016, 04/08/2017, 06/30/2017, 03/31/2018, 04/12/2019, 04/03/2020   Influenza-Unspecified 07/12/2022, 03/05/2023   MMR 07/08/2017, 05/28/2022   Moderna Sars-Covid-2 Vaccination 01/09/2021   PFIZER(Purple Top)SARS-COV-2 Vaccination 09/20/2019, 10/09/2019, 01/09/2021, 07/12/2022   PNEUMOCOCCAL CONJUGATE-20 07/24/2021, 01/22/2022, 05/28/2022   Pfizer(Comirnaty)Fall Seasonal Vaccine 12 years and older 03/05/2023   Pneumococcal Conjugate-13 03/23/2015, 07/09/2016, 01/07/2017, 03/11/2017, 11/14/2020   Pneumococcal Polysaccharide-23 07/08/2017   Rabies, IM 10/15/2022, 10/18/2022, 10/22/2022, 10/29/2022   Respiratory Syncytial Virus Vaccine,Recomb Aduvanted(Arexvy) 03/05/2023   Td 08/20/2020   Tdap 07/09/2016, 09/10/2016, 02/12/2018   Zoster Recombinant(Shingrix) 11/19/2017, 11/20/2017, 02/12/2018, 07/23/2020, 01/09/2021, 01/16/2021     Screening Tests Health Maintenance  Topic Date Due   Mammogram  12/04/2023   COVID-19 Vaccine (6 - Pfizer risk 2024-25 season) 03/01/2024   Influenza Vaccine  04/13/2024 (Originally 01/30/2024)   HEMOGLOBIN A1C  07/25/2024   OPHTHALMOLOGY EXAM  01/15/2025   Diabetic kidney evaluation - Urine ACR  01/22/2025   FOOT EXAM  01/22/2025   Diabetic kidney evaluation - eGFR measurement  02/17/2025   Medicare Annual Wellness (AWV)  03/18/2025   Colonoscopy  12/20/2027   DEXA SCAN  09/29/2028   DTaP/Tdap/Td (8 - Td or Tdap) 01/23/2032   Pneumococcal Vaccine: 50+ Years  Completed   Hepatitis C Screening  Completed   Zoster Vaccines- Shingrix  Completed   HPV VACCINES  Aged Out   Meningococcal B Vaccine  Aged Out   Hepatitis B Vaccines 19-59 Average Risk  Discontinued    Health Maintenance Items Addressed: See Nurse Notes at the end of this note  Additional Screening:  Vision Screening: Recommended annual ophthalmology exams for early detection of glaucoma and other disorders of the eye. Is the patient up to date with their annual eye exam?  Yes  Who is the provider or what is the name of the office in which the patient attends annual eye exams? Dr. Lonni Burns [facility]  Dental Screening: Recommended annual dental exams for proper oral hygiene  Community Resource Referral / Chronic Care Management: CRR required this visit?  No   CCM required this visit?  No   Plan:    I have personally reviewed and noted the following in the patient's chart:   Medical and social history Use of alcohol, tobacco or illicit drugs  Current medications and supplements including opioid prescriptions. Patient is currently taking opioid prescriptions. Information provided to patient regarding non-opioid alternatives. Patient advised to discuss non-opioid treatment plan with their provider. Functional ability and status Nutritional status Physical activity Advanced directives List of  other physicians Hospitalizations, surgeries, and ER visits in previous 12 months Vitals Screenings to include cognitive, depression, and falls Referrals and appointments  In addition, I have reviewed and discussed with patient certain preventive protocols, quality metrics, and  best practice recommendations. A written personalized care plan for preventive services as well as general preventive health recommendations were provided to patient.   Vina Ned, CMA   03/18/2024   After Visit Summary: (MyChart) Due to this being a telephonic visit, the after visit summary with patients personalized plan was offered to patient via MyChart   Notes:  6 CIT Score - 4 Gave ph# to schedule [facility] (ordered 01/23/24) Placed referral to DM & Nutrition education per patient's request Needs Covid and flu vaccines (pharmacy)

## 2024-03-19 ENCOUNTER — Other Ambulatory Visit: Payer: Self-pay

## 2024-03-19 DIAGNOSIS — M9901 Segmental and somatic dysfunction of cervical region: Secondary | ICD-10-CM | POA: Diagnosis not present

## 2024-03-19 DIAGNOSIS — M62830 Muscle spasm of back: Secondary | ICD-10-CM | POA: Diagnosis not present

## 2024-03-19 DIAGNOSIS — M5412 Radiculopathy, cervical region: Secondary | ICD-10-CM | POA: Diagnosis not present

## 2024-03-19 DIAGNOSIS — M9902 Segmental and somatic dysfunction of thoracic region: Secondary | ICD-10-CM | POA: Diagnosis not present

## 2024-03-24 ENCOUNTER — Inpatient Hospital Stay (HOSPITAL_BASED_OUTPATIENT_CLINIC_OR_DEPARTMENT_OTHER): Admitting: Hospice and Palliative Medicine

## 2024-03-24 ENCOUNTER — Inpatient Hospital Stay

## 2024-03-24 ENCOUNTER — Other Ambulatory Visit: Payer: Self-pay

## 2024-03-24 ENCOUNTER — Encounter: Payer: Self-pay | Admitting: *Deleted

## 2024-03-24 VITALS — BP 130/85 | HR 70 | Temp 98.1°F | Resp 18

## 2024-03-24 DIAGNOSIS — J069 Acute upper respiratory infection, unspecified: Secondary | ICD-10-CM | POA: Diagnosis not present

## 2024-03-24 DIAGNOSIS — J398 Other specified diseases of upper respiratory tract: Secondary | ICD-10-CM

## 2024-03-24 DIAGNOSIS — I4581 Long QT syndrome: Secondary | ICD-10-CM | POA: Diagnosis not present

## 2024-03-24 DIAGNOSIS — C9001 Multiple myeloma in remission: Secondary | ICD-10-CM

## 2024-03-24 DIAGNOSIS — B354 Tinea corporis: Secondary | ICD-10-CM

## 2024-03-24 DIAGNOSIS — Z79899 Other long term (current) drug therapy: Secondary | ICD-10-CM

## 2024-03-24 DIAGNOSIS — R6883 Chills (without fever): Secondary | ICD-10-CM

## 2024-03-24 DIAGNOSIS — J029 Acute pharyngitis, unspecified: Secondary | ICD-10-CM

## 2024-03-24 LAB — RESPIRATORY PANEL BY PCR

## 2024-03-24 LAB — CBC WITH DIFFERENTIAL (CANCER CENTER ONLY)
Abs Immature Granulocytes: 0.05 K/uL (ref 0.00–0.07)
Basophils Absolute: 0 K/uL (ref 0.0–0.1)
Basophils Relative: 0 %
Eosinophils Absolute: 0.1 K/uL (ref 0.0–0.5)
Eosinophils Relative: 1 %
HCT: 36.5 % (ref 36.0–46.0)
Hemoglobin: 12.3 g/dL (ref 12.0–15.0)
Immature Granulocytes: 1 %
Lymphocytes Relative: 11 %
Lymphs Abs: 1 K/uL (ref 0.7–4.0)
MCH: 28.3 pg (ref 26.0–34.0)
MCHC: 33.7 g/dL (ref 30.0–36.0)
MCV: 84.1 fL (ref 80.0–100.0)
Monocytes Absolute: 0.7 K/uL (ref 0.1–1.0)
Monocytes Relative: 7 %
Neutro Abs: 7.5 K/uL (ref 1.7–7.7)
Neutrophils Relative %: 80 %
Platelet Count: 106 K/uL — ABNORMAL LOW (ref 150–400)
RBC: 4.34 MIL/uL (ref 3.87–5.11)
RDW: 13.2 % (ref 11.5–15.5)
WBC Count: 9.3 K/uL (ref 4.0–10.5)
nRBC: 0 % (ref 0.0–0.2)

## 2024-03-24 LAB — MAGNESIUM: Magnesium: 1.4 mg/dL — ABNORMAL LOW (ref 1.7–2.4)

## 2024-03-24 MED ORDER — SODIUM CHLORIDE 0.9 % IV SOLN
12.5000 mg | Freq: Once | INTRAVENOUS | Status: AC
  Administered 2024-03-24: 12.5 mg via INTRAVENOUS
  Filled 2024-03-24: qty 0.5

## 2024-03-24 MED ORDER — AMOXICILLIN-POT CLAVULANATE 875-125 MG PO TABS: 1.0000 | ORAL_TABLET | Freq: Two times a day (BID) | ORAL | 0 refills | Status: DC

## 2024-03-24 MED ORDER — BENZONATATE 100 MG PO CAPS: 100.0000 mg | ORAL_CAPSULE | Freq: Three times a day (TID) | ORAL | 0 refills | Status: DC | PRN

## 2024-03-24 MED ORDER — SODIUM CHLORIDE 0.9 % IV SOLN
INTRAVENOUS | Status: DC
  Filled 2024-03-24: qty 250

## 2024-03-24 MED ORDER — MAGNESIUM SULFATE 4 GM/100ML IV SOLN
4.0000 g | Freq: Once | INTRAVENOUS | Status: AC
  Administered 2024-03-24: 4 g via INTRAVENOUS
  Filled 2024-03-24: qty 100

## 2024-03-24 NOTE — Progress Notes (Signed)
 Symptom Management Clinic [facility] at [facility] [phone] [phone]  Patient Care Team: Justus Leita DEL, MD as PCP - General (Internal Medicine) Babara Call, MD as Consulting Physician (Oncology) Berneda Lonni MATSU, MD as Referring Physician (Ophthalmology) Barnet Donnice Righter, MD as Referring Physician (Cardiology) Beschel Chropractor Lateef, Munsoor, MD (Nephrology) Therisa Bi, MD as Consulting Physician (Gastroenterology)   NAME OF PATIENT: Sarah [name]  [id number]  10-20-1955   DATE OF VISIT: 03/24/24  REASON FOR CONSULT: [name] is a 68 y.o. female with multiple medical problems including multiple myeloma in remission status post bone marrow transplant on 05/10/2020.  Patient off Dara due to frequent infections.  INTERVAL HISTORY: Patient has chronic nausea and diarrhea.  She follows up frequently in clinic for magnesium  infusions.  Patient was an add-on to clinic schedule today to address URI symptoms.  Patient reports sick contacts with her grandchildren who had strep and respiratory symptoms.  She says that she has been sick over a week with sinus congestion, postnasal drip, sore throat, and chills.  She feels like her symptoms are worsening not improving.  Patient says that she has had some shortness of breath, productive cough, and congestion.  Denies fever.  Patient also reports history of poor dentition and requests referral to an oral surgeon.  Denies any neurologic complaints. Denies any easy bleeding or bruising. Reports good appetite and denies weight loss. Denies chest pain. Denies urinary complaints. Patient offers no further specific complaints today.   PAST MEDICAL HISTORY: Past Medical History:  Diagnosis Date   Anemia    Anxiety    Aortic stenosis    a. 05/2020 Echo: EF 45-50%, sev AS - seen by TAVR team @ [facility] - CTA sugg of tricuspid valve w/ fusing of 2 leaflets-TAVR deferred in  setting of acute infxn; b. 07/2020 Echo: EF 50-55%, mod-sev AS; c. 12/2020 Echo: EF>55%. Mod-sev paradoxical low-flow low-gradient AS; d. 08/2021 Echo: EF 35-40%, severe AS, triv AI, mild MR.   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 Variable EF over time; b. 08/2017 Echo: EF 40%; b. 03/2020 Echo: EF 55-60%; c. 05/2020 Echo: EF 45-50%; d. 07/2020 Echo: EF 50-55%; e. 12/2020 Echo: EF>55%; f. 08/2021 Echo: EF 35-40%.   CHF (congestive heart failure) (HCC)    Chronic heart failure with preserved ejection fraction (HFpEF) (HCC)    a. Variable EF over time; b. 08/2017 Echo: EF 40%; b. 03/2020 Echo: EF 55-60%; c. 05/2020 Echo: EF 45-50%; d. 07/2020 Echo: EF 50-55%; e. 12/2020 Echo: EF>55%; f. 08/2021 Echo: EF 35-40%, no RV, mildly dil LA, mild MR, triv AI, severe AS.   Chronic pain syndrome 07/08/2016   CKD (chronic kidney disease) stage 3, GFR 30-59 ml/min (HCC)    Colon cancer (HCC)    Dad   Depression    Diabetes mellitus (HCC)    Dizziness    Fatty liver    Frequent falls    GERD (gastroesophageal reflux disease)    Gout    Heart murmur    History of blood transfusion    History of bone marrow transplant (HCC)    History of uterine fibroid    Hx of cardiac catheterization    a. 01/2016 Cath ([place]  - after abnl nuc): Nl cors.   Hypertension    Hypomagnesemia  IDA (iron  deficiency anemia)    Multiple myeloma (HCC)    NSTEMI (non-ST elevated myocardial infarction) (HCC) 08/21/2021   Personal history of chemotherapy    Pleurisy 04/09/2022   PSVT (paroxysmal supraventricular tachycardia)    a. S/p RFCA 1999 ([place] ).   PVC's (premature ventricular contractions)    a. Well-managed w/ bisoprolol  in outpt setting.   Renal cyst    Ulcer 2008   3 ulcers    PAST SURGICAL HISTORY:  Past Surgical History:  Procedure Laterality Date    ABDOMINAL HYSTERECTOMY     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   CARDIAC ELECTROPHYSIOLOGY MAPPING AND ABLATION     CARPAL TUNNEL RELEASE Bilateral    CHOLECYSTECTOMY  2008   COLONOSCOPY WITH PROPOFOL  N/A 05/07/2017   Procedure: COLONOSCOPY WITH PROPOFOL ;  Surgeon: Therisa Bi, MD;  Location: [facility] ENDOSCOPY;  Service: Gastroenterology;  Laterality: N/A;   COLONOSCOPY WITH PROPOFOL  N/A 12/19/2020   Procedure: COLONOSCOPY WITH PROPOFOL ;  Surgeon: Therisa Bi, MD;  Location: [facility] ENDOSCOPY;  Service: Gastroenterology;  Laterality: N/A;   ESOPHAGOGASTRODUODENOSCOPY (EGD) WITH PROPOFOL  N/A 05/07/2017   Procedure: ESOPHAGOGASTRODUODENOSCOPY (EGD) WITH PROPOFOL ;  Surgeon: Therisa Bi, MD;  Location: [facility] ENDOSCOPY;  Service: Gastroenterology;  Laterality: N/A;   FOOT SURGERY Bilateral    INCONTINENCE SURGERY  2009   INTERSTIM IMPLANT PLACEMENT     other     over active bladder   OTHER SURGICAL HISTORY     bladder stimulator    PARTIAL HYSTERECTOMY  03/1996   fibroids   PORTA CATH INSERTION N/A 03/10/2019   Procedure: PORTA CATH INSERTION;  Surgeon: Marea Selinda RAMAN, MD;  Location: [facility] INVASIVE CV LAB;  Service: Cardiovascular;  Laterality: N/A;   TONSILLECTOMY  2007   TRANSCATHETER AORTIC VALVE REPLACEMENT, TRANSFEMORAL  11/06/2023   TUBAL LIGATION  1984    HEMATOLOGY/ONCOLOGY HISTORY:  Oncology History Overview Note  # June 2017- [facility] MULTIPLE MYELOMA [BMBx- 80% plasma cells; [facility]]; Lamda light chain 1340; s/p RVD   # 14th DEC 2016- Auto-Stem cell transplant [[facility]  [place] ;Dr.Hertzig]; rev [dose reduced sec to Diarrhea]  # Maintenance Revlimid - 5mg  3 w-ON & 1 week OFF; HELD July 2018- sec to ONJ  # Bone lesions-numerous ~65mm lytic lesions in Thoracic spine- on Zometa   # July-Aug 2018- OSTEONECROSIS OF JAW- DISCONTINUED ZOMETA   # June 2016- Proteinuria [3gm/day]/ CKD III   # chronic back pain/ anxiety/ PN  # final DTaP HIV polio hepatitis B  Pneumovax MMR.  # Colitis s/p multiple EGD/Colonoscopies; EGD- patchy inflammation/colo- Nov 2018 [Dr.Anna]  DIAGNOSIS: Multiple myeloma  GOALS: Control/palliative  CURRENT/MOST RECENT THERAPY -surveillance    Multiple myeloma in remission (HCC)  09/27/2019 - 04/10/2020 Chemotherapy         08/31/2020 - 05/14/2021 Chemotherapy   Patient is on Treatment Plan : MYELOMA MAINTENANCE Bortezomib  SQ q14d     05/30/2021 - 05/30/2021 Chemotherapy   Patient is on Treatment Plan : MYELOMA NEWLY DIAGNOSED TRANSPLANT CANDIDATE Daratumumab  SQ + Lenalidomide  q28d (Maintenance)     05/30/2021 -  Chemotherapy   Patient is on Treatment Plan : MYELOMA NEWLY DIAGNOSED TRANSPLANT CANDIDATE Daratumumab  SQ + Lenalidomide  q28d (Maintenance)     06/06/2021 - 10/29/2021 Chemotherapy   Patient is on Treatment Plan : MYELOMA POST  TRANSPLANT Daratumumab  SQ (Maintenance)     Multiple myeloma in remission (HCC)  05/14/2021 Initial Diagnosis   Multiple myeloma in remission (HCC)   05/30/2021 - 05/30/2021 Chemotherapy   Patient is on Treatment Plan :  MYELOMA NEWLY DIAGNOSED TRANSPLANT CANDIDATE Daratumumab  SQ + Lenalidomide  q28d (Maintenance)     05/30/2021 -  Chemotherapy   Patient is on Treatment Plan : MYELOMA NEWLY DIAGNOSED TRANSPLANT CANDIDATE Daratumumab  SQ + Lenalidomide  q28d (Maintenance)     06/06/2021 - 10/29/2021 Chemotherapy   Patient is on Treatment Plan : MYELOMA POST  TRANSPLANT Daratumumab  SQ (Maintenance)       ALLERGIES:  is allergic to other, oxycodone-acetaminophen , celebrex  [celecoxib ], codeine , plerixafor, benadryl  [diphenhydramine ], morphine , ondansetron , and tylenol  [acetaminophen ].  MEDICATIONS:  Current Outpatient Medications  Medication Sig Dispense Refill   allopurinol  (ZYLOPRIM ) 100 MG tablet TAKE 1 TABLET(100 MG) BY MOUTH DAILY as needed 90 tablet 1   amiodarone (PACERONE) 200 MG tablet Take 200 mg by mouth daily.     bisoprolol  (ZEBETA ) 10 MG tablet Take 1 tablet (10 mg  total) by mouth daily. 90 tablet 1   celecoxib  (CELEBREX ) 200 MG capsule Take 200 mg by mouth daily. (Patient not taking: Reported on 03/18/2024)     cholestyramine  (QUESTRAN ) 4 GM/DOSE powder Take 1 packet (4 g total) by mouth 2 (two) times daily with a meal. (Patient taking differently: Take 4 g by mouth 2 (two) times daily with a meal. Twice a day prn) 240 packet 2   diazepam  (VALIUM ) 5 MG tablet TAKE 1/2 TO 1 TABLET(2.5 TO 5 MG) BY MOUTH AT BEDTIME AS NEEDED FOR INSOMNIA (Patient taking differently: Take 5 mg by mouth every 6 (six) hours as needed.) 30 tablet 5   diclofenac  sodium (VOLTAREN ) 1 % GEL Apply 2 g topically 4 (four) times daily. 100 g 1   diphenoxylate -atropine  (LOMOTIL ) 2.5-0.025 MG tablet Take 2 tablets by mouth 4 (four) times daily as needed for diarrhea or loose stools. 180 tablet 3   ENTRESTO  24-26 MG Take 1 tablet by mouth 2 (two) times daily.     FLUoxetine  (PROZAC ) 40 MG capsule Take 1 capsule (40 mg total) by mouth daily. 90 capsule 1   fluticasone  (FLONASE ) 50 MCG/ACT nasal spray Place 2 sprays into both nostrils daily.     furosemide  (LASIX ) 40 MG tablet Take 0.5 tablets (20 mg total) by mouth daily. Home dose.     glipiZIDE  (GLUCOTROL  XL) 2.5 MG 24 hr tablet TAKE 1 TABLET(2.5 MG) BY MOUTH DAILY WITH BREAKFAST 90 tablet 1   glucose blood test strip  (Patient not taking: Reported on 03/18/2024)     JARDIANCE 10 MG TABS tablet Take 10 mg by mouth daily.     lidocaine  (XYLOCAINE ) 2 % solution Use as directed 15 mLs in the mouth or throat every 3 (three) hours as needed for mouth pain (swish and spit). (Patient not taking: Reported on 03/18/2024) 100 mL 0   metaxalone  (SKELAXIN ) 800 MG tablet Take 1 tablet (800 mg total) by mouth in the morning. (Patient taking differently: Take 800 mg by mouth in the morning. PRN) 30 tablet 0   Multiple Vitamin (MULTIVITAMIN) tablet Take 1 tablet by mouth daily.     nystatin  cream (MYCOSTATIN ) Apply 1 Application topically 2 (two) times daily. To  rash in abdominal folds. (Patient taking differently: Apply 1 Application topically 2 (two) times daily. To rash in abdominal folds. PRN) 30 g 0   omeprazole  (PRILOSEC) 40 MG capsule TAKE 1 CAPSULE IN THE MORNING AND TAKE 1 CAPSULE AT BEDTIME 180 capsule 3   potassium chloride  SA (KLOR-CON  M) 20 MEQ tablet Take 1 tablet (20 mEq total) by mouth daily. 7 tablet 0   promethazine  (PHENERGAN ) 25 MG tablet TAKE  1 TABLET(25 MG) BY MOUTH EVERY 6 HOURS AS NEEDED FOR NAUSEA OR VOMITING 90 tablet 1   rosuvastatin  (CRESTOR ) 5 MG tablet Take 1 tablet (5 mg total) by mouth daily. 90 tablet 1   spironolactone (ALDACTONE) 25 MG tablet Take by mouth.     tiZANidine  (ZANAFLEX ) 4 MG tablet Take 1 tablet (4 mg total) by mouth at bedtime. 90 tablet 0   traMADol  (ULTRAM ) 50 MG tablet  (Patient taking differently: PRN)     traZODone  (DESYREL ) 100 MG tablet TAKE 1 TABLET AT BEDTIME (Patient taking differently: Every other day PRN) 90 tablet 3   VITAMIN D , CHOLECALCIFEROL, PO Take 2,000 Units by mouth daily.     No current facility-administered medications for this visit.   Facility-Administered Medications Ordered in Other Visits  Medication Dose Route Frequency Provider Last Rate Last Admin   magnesium  sulfate IVPB 4 g 100 mL  4 g Intravenous Once Babara Call, MD       promethazine  (PHENERGAN ) 12.5 mg in sodium chloride  0.9 % 50 mL IVPB  12.5 mg Intravenous Once Babara Call, MD       sodium chloride  flush (NS) 0.9 % injection 10 mL  10 mL Intravenous PRN Babara Call, MD   10 mL at 04/02/21 0904   sodium chloride  flush (NS) 0.9 % injection 10 mL  10 mL Intracatheter Once PRN Babara Call, MD        VITAL SIGNS: There were no vitals taken for this visit. There were no vitals filed for this visit.  Estimated body mass index is 29.7 kg/m as calculated from the following:   Height as of 03/18/24: 5' 2.5 (1.588 m).   Weight as of 03/18/24: 165 lb (74.8 kg).  LABS: CBC:    Component Value Date/Time   WBC 9.3 03/24/2024 0941    WBC 7.0 09/11/2022 1006   HGB 12.3 03/24/2024 0941   HCT 36.5 03/24/2024 0941   PLT 106 (L) 03/24/2024 0941   MCV 84.1 03/24/2024 0941   NEUTROABS 7.5 03/24/2024 0941   LYMPHSABS 1.0 03/24/2024 0941   MONOABS 0.7 03/24/2024 0941   EOSABS 0.1 03/24/2024 0941   BASOSABS 0.0 03/24/2024 0941   Comprehensive Metabolic Panel:    Component Value Date/Time   NA 136 02/18/2024 0935   NA 141 04/19/2016 1604   K 3.9 02/18/2024 0935   CL 105 02/18/2024 0935   CO2 22 02/18/2024 0935   BUN 34 (H) 02/18/2024 0935   BUN 22 04/19/2016 1604   CREATININE 1.49 (H) 02/18/2024 0935   GLUCOSE 116 (H) 02/18/2024 0935   CALCIUM  9.0 02/18/2024 0935   CALCIUM  8.8 03/23/2020 1144   AST 31 02/18/2024 0935   ALT 33 02/18/2024 0935   ALKPHOS 81 02/18/2024 0935   BILITOT 0.4 02/18/2024 0935   PROT 7.1 02/18/2024 0935   ALBUMIN  4.1 02/18/2024 0935    RADIOGRAPHIC STUDIES: No results found.  PERFORMANCE STATUS (ECOG) : 1 - Symptomatic but completely ambulatory  Review of Systems Unless otherwise noted, a complete review of systems is negative.  Physical Exam General: NAD HEENT: No lymphadenopathy or oral lesions, no exudate Cardiovascular: regular rate and rhythm Pulmonary: Cough, congested sounding anterior/posterior fields Abdomen: soft, nontender, + bowel sounds GU: no suprapubic tenderness Extremities: no edema, no joint deformities Skin: no rashes Neurological: Weakness but otherwise nonfocal  IMPRESSION/PLAN: Multiple myeloma -on surveillance with ongoing supportive care  URI -likely viral and will check respiratory panel.  However, given duration and reported worsening of symptoms, will empirically  cover with Augmentin  twice daily x 10 days.  Recommended Mucinex .  Rx sent for benzonatate .  Patient reports history of vaginal candidiasis with antibiotics and requested fluconazole .  However, QTc prolonged chronically and therefore will avoid fluconazole .  Patient advised that she can try  topical Monistat instead.  Poor dentition -patient requests referral to an oral surgeon.  Prolonged QTc -patient advised to follow-up with cardiology (she is calling to make an appointment).  Discussed medications to avoid.  Case and plan discussed with Dr. Babara   Patient expressed understanding and was in agreement with this plan. She also understands that She can call clinic at any time with any questions, concerns, or complaints.   Thank you for allowing me to participate in the care of this very pleasant patient.   Time Total: 15 minutes  Visit consisted of counseling and education dealing with the complex and emotionally intense issues of symptom management in the setting of serious illness.Greater than 50%  of this time was spent counseling and coordinating care related to the above assessment and plan.  Signed by: Fonda Mower, PhD, NP-C

## 2024-03-24 NOTE — Progress Notes (Signed)
 Patient c/o chills, cough, runny nose, sore throat. She was exposed to family member with strep and sinus infections. Pt requesting to see smc provider today for further evaluation. Spoke with Dr. Babara and Sidra, NP. Ok to add to [employer] today. V/o to order resp panel, strep throat panel and add cbc.

## 2024-03-25 ENCOUNTER — Telehealth: Payer: Self-pay | Admitting: *Deleted

## 2024-03-25 NOTE — Telephone Encounter (Signed)
 Rn contacted patient. Provided respiratory panel results to patient. Pt tested positive for common cold. The strep culture panel is still pending. Patient prescribed Augmentin  yesterday. She would not need to take this for the common cold. She can use otc remedies/meds in the meantime. She is aware that strep panel is still pending.

## 2024-03-26 ENCOUNTER — Encounter: Payer: Self-pay | Admitting: *Deleted

## 2024-03-26 LAB — CULTURE, GROUP A STREP (THRC)

## 2024-03-30 MED FILL — Promethazine HCl Inj 25 MG/ML: INTRAMUSCULAR | Qty: 0.5 | Status: AC

## 2024-03-31 ENCOUNTER — Inpatient Hospital Stay

## 2024-03-31 ENCOUNTER — Inpatient Hospital Stay: Attending: Oncology

## 2024-03-31 ENCOUNTER — Encounter: Payer: Self-pay | Admitting: Oncology

## 2024-03-31 ENCOUNTER — Ambulatory Visit
Admission: RE | Admit: 2024-03-31 | Discharge: 2024-03-31 | Disposition: A | Discharge status: Home / Self Care | Source: Ambulatory Visit | Attending: Oncology | Admitting: Oncology

## 2024-03-31 ENCOUNTER — Inpatient Hospital Stay (HOSPITAL_BASED_OUTPATIENT_CLINIC_OR_DEPARTMENT_OTHER): Admitting: Oncology

## 2024-03-31 VITALS — BP 146/68 | HR 60 | Temp 97.6°F | Resp 16 | Wt 163.0 lb

## 2024-03-31 DIAGNOSIS — D801 Nonfamilial hypogammaglobulinemia: Secondary | ICD-10-CM | POA: Diagnosis not present

## 2024-03-31 DIAGNOSIS — Z886 Allergy status to analgesic agent status: Secondary | ICD-10-CM | POA: Insufficient documentation

## 2024-03-31 DIAGNOSIS — Z885 Allergy status to narcotic agent status: Secondary | ICD-10-CM | POA: Insufficient documentation

## 2024-03-31 DIAGNOSIS — Z79899 Other long term (current) drug therapy: Secondary | ICD-10-CM | POA: Insufficient documentation

## 2024-03-31 DIAGNOSIS — Z9481 Bone marrow transplant status: Secondary | ICD-10-CM | POA: Insufficient documentation

## 2024-03-31 DIAGNOSIS — I350 Nonrheumatic aortic (valve) stenosis: Secondary | ICD-10-CM | POA: Insufficient documentation

## 2024-03-31 DIAGNOSIS — E538 Deficiency of other specified B group vitamins: Secondary | ICD-10-CM | POA: Diagnosis not present

## 2024-03-31 DIAGNOSIS — R918 Other nonspecific abnormal finding of lung field: Secondary | ICD-10-CM | POA: Insufficient documentation

## 2024-03-31 DIAGNOSIS — Z85038 Personal history of other malignant neoplasm of large intestine: Secondary | ICD-10-CM | POA: Diagnosis not present

## 2024-03-31 DIAGNOSIS — D500 Iron deficiency anemia secondary to blood loss (chronic): Secondary | ICD-10-CM

## 2024-03-31 DIAGNOSIS — I130 Hypertensive heart and chronic kidney disease with heart failure and stage 1 through stage 4 chronic kidney disease, or unspecified chronic kidney disease: Secondary | ICD-10-CM | POA: Insufficient documentation

## 2024-03-31 DIAGNOSIS — C9001 Multiple myeloma in remission: Secondary | ICD-10-CM | POA: Insufficient documentation

## 2024-03-31 DIAGNOSIS — G894 Chronic pain syndrome: Secondary | ICD-10-CM | POA: Diagnosis not present

## 2024-03-31 DIAGNOSIS — I429 Cardiomyopathy, unspecified: Secondary | ICD-10-CM | POA: Insufficient documentation

## 2024-03-31 DIAGNOSIS — Z87891 Personal history of nicotine dependence: Secondary | ICD-10-CM | POA: Diagnosis not present

## 2024-03-31 DIAGNOSIS — N184 Chronic kidney disease, stage 4 (severe): Secondary | ICD-10-CM | POA: Insufficient documentation

## 2024-03-31 DIAGNOSIS — Z808 Family history of malignant neoplasm of other organs or systems: Secondary | ICD-10-CM | POA: Insufficient documentation

## 2024-03-31 DIAGNOSIS — I252 Old myocardial infarction: Secondary | ICD-10-CM | POA: Diagnosis not present

## 2024-03-31 DIAGNOSIS — R059 Cough, unspecified: Secondary | ICD-10-CM

## 2024-03-31 DIAGNOSIS — E1122 Type 2 diabetes mellitus with diabetic chronic kidney disease: Secondary | ICD-10-CM | POA: Diagnosis not present

## 2024-03-31 DIAGNOSIS — F32A Depression, unspecified: Secondary | ICD-10-CM | POA: Diagnosis not present

## 2024-03-31 DIAGNOSIS — K760 Fatty (change of) liver, not elsewhere classified: Secondary | ICD-10-CM | POA: Diagnosis not present

## 2024-03-31 DIAGNOSIS — Z9221 Personal history of antineoplastic chemotherapy: Secondary | ICD-10-CM | POA: Insufficient documentation

## 2024-03-31 DIAGNOSIS — I5032 Chronic diastolic (congestive) heart failure: Secondary | ICD-10-CM | POA: Insufficient documentation

## 2024-03-31 DIAGNOSIS — Z90711 Acquired absence of uterus with remaining cervical stump: Secondary | ICD-10-CM | POA: Insufficient documentation

## 2024-03-31 DIAGNOSIS — Z86018 Personal history of other benign neoplasm: Secondary | ICD-10-CM | POA: Insufficient documentation

## 2024-03-31 DIAGNOSIS — Z803 Family history of malignant neoplasm of breast: Secondary | ICD-10-CM | POA: Insufficient documentation

## 2024-03-31 DIAGNOSIS — J209 Acute bronchitis, unspecified: Secondary | ICD-10-CM | POA: Insufficient documentation

## 2024-03-31 DIAGNOSIS — Z801 Family history of malignant neoplasm of trachea, bronchus and lung: Secondary | ICD-10-CM | POA: Insufficient documentation

## 2024-03-31 DIAGNOSIS — Z888 Allergy status to other drugs, medicaments and biological substances status: Secondary | ICD-10-CM | POA: Insufficient documentation

## 2024-03-31 DIAGNOSIS — J3489 Other specified disorders of nose and nasal sinuses: Secondary | ICD-10-CM | POA: Insufficient documentation

## 2024-03-31 DIAGNOSIS — Z8249 Family history of ischemic heart disease and other diseases of the circulatory system: Secondary | ICD-10-CM | POA: Insufficient documentation

## 2024-03-31 DIAGNOSIS — Z9484 Stem cells transplant status: Secondary | ICD-10-CM | POA: Insufficient documentation

## 2024-03-31 DIAGNOSIS — Z800 Family history of malignant neoplasm of digestive organs: Secondary | ICD-10-CM | POA: Insufficient documentation

## 2024-03-31 DIAGNOSIS — Z833 Family history of diabetes mellitus: Secondary | ICD-10-CM | POA: Insufficient documentation

## 2024-03-31 DIAGNOSIS — Z8419 Family history of other disorders of kidney and ureter: Secondary | ICD-10-CM | POA: Insufficient documentation

## 2024-03-31 DIAGNOSIS — Z9049 Acquired absence of other specified parts of digestive tract: Secondary | ICD-10-CM | POA: Insufficient documentation

## 2024-03-31 LAB — CMP (CANCER CENTER ONLY)
ALT: 31 U/L (ref 0–44)
AST: 27 U/L (ref 15–41)
Albumin: 4.1 g/dL (ref 3.5–5.0)
Alkaline Phosphatase: 98 U/L (ref 38–126)
Anion gap: 9 (ref 5–15)
BUN: 28 mg/dL — ABNORMAL HIGH (ref 8–23)
CO2: 19 mmol/L — ABNORMAL LOW (ref 22–32)
Calcium: 8.8 mg/dL — ABNORMAL LOW (ref 8.9–10.3)
Chloride: 110 mmol/L (ref 98–111)
Creatinine: 1.53 mg/dL — ABNORMAL HIGH (ref 0.44–1.00)
GFR, Estimated: 37 mL/min — ABNORMAL LOW (ref >60)
Glucose, Bld: 119 mg/dL — ABNORMAL HIGH (ref 70–99)
Potassium: 3.8 mmol/L (ref 3.5–5.1)
Sodium: 138 mmol/L (ref 135–145)
Total Bilirubin: 0.7 mg/dL (ref 0.0–1.2)
Total Protein: 7 g/dL (ref 6.5–8.1)

## 2024-03-31 LAB — CBC WITH DIFFERENTIAL (CANCER CENTER ONLY)
Abs Immature Granulocytes: 0.05 K/uL (ref 0.00–0.07)
Basophils Absolute: 0 K/uL (ref 0.0–0.1)
Basophils Relative: 1 %
Eosinophils Absolute: 0.1 K/uL (ref 0.0–0.5)
Eosinophils Relative: 3 %
HCT: 34.9 % — ABNORMAL LOW (ref 36.0–46.0)
Hemoglobin: 11.7 g/dL — ABNORMAL LOW (ref 12.0–15.0)
Immature Granulocytes: 1 %
Lymphocytes Relative: 29 %
Lymphs Abs: 1.5 K/uL (ref 0.7–4.0)
MCH: 28.1 pg (ref 26.0–34.0)
MCHC: 33.5 g/dL (ref 30.0–36.0)
MCV: 83.7 fL (ref 80.0–100.0)
Monocytes Absolute: 0.4 K/uL (ref 0.1–1.0)
Monocytes Relative: 7 %
Neutro Abs: 3.1 K/uL (ref 1.7–7.7)
Neutrophils Relative %: 59 %
Platelet Count: 117 K/uL — ABNORMAL LOW (ref 150–400)
RBC: 4.17 MIL/uL (ref 3.87–5.11)
RDW: 13.5 % (ref 11.5–15.5)
WBC Count: 5.2 K/uL (ref 4.0–10.5)
nRBC: 0 % (ref 0.0–0.2)

## 2024-03-31 LAB — MAGNESIUM: Magnesium: 1.5 mg/dL — ABNORMAL LOW (ref 1.7–2.4)

## 2024-03-31 MED ORDER — CLOTRIMAZOLE 2 % VA CREA: 1.0000 | TOPICAL_CREAM | Freq: Every day | VAGINAL | 0 refills | Status: AC

## 2024-03-31 MED ORDER — BENZONATATE 100 MG PO CAPS: 100.0000 mg | ORAL_CAPSULE | Freq: Three times a day (TID) | ORAL | 0 refills | Status: AC | PRN

## 2024-03-31 MED ORDER — SODIUM CHLORIDE 0.9 % IV SOLN
INTRAVENOUS | Status: DC
  Filled 2024-03-31: qty 250

## 2024-03-31 MED ORDER — MAGNESIUM SULFATE 4 GM/100ML IV SOLN
4.0000 g | Freq: Once | INTRAVENOUS | Status: AC
  Administered 2024-03-31: 4 g via INTRAVENOUS
  Filled 2024-03-31: qty 100

## 2024-03-31 MED ORDER — CYANOCOBALAMIN 1000 MCG/ML IJ SOLN
1000.0000 ug | Freq: Once | INTRAMUSCULAR | Status: AC
  Administered 2024-03-31: 1000 ug via INTRAMUSCULAR
  Filled 2024-03-31: qty 1

## 2024-03-31 MED ORDER — SODIUM CHLORIDE 0.9 % IV SOLN
12.5000 mg | Freq: Once | INTRAVENOUS | Status: AC
  Administered 2024-03-31: 12.5 mg via INTRAVENOUS
  Filled 2024-03-31: qty 0.5

## 2024-03-31 NOTE — Assessment & Plan Note (Signed)
 continue B12 injections Q 6 weeks.

## 2024-03-31 NOTE — Assessment & Plan Note (Signed)
-  avoid nephrotoxins

## 2024-03-31 NOTE — Assessment & Plan Note (Signed)
#  Chronic hypomagnesemia proceed with  IV magnesium today.  Continue weekly magnesium +/- IV magnesium.

## 2024-03-31 NOTE — Assessment & Plan Note (Signed)
 Chest x-ray is negative for acute process. Suspect that patient's symptoms due to bacterial bronchitis superimposed on upper respiratory viral infection.  Recommend patient to finish 10-day course of Augmentin . Tessalon  as needed for cough. Patient request coverage of vaginal candidiasis while on antibiotics.  Given her prolonged QTc, we will hold off fluconazole .  Recommend clotrimazole  vaginal cream.

## 2024-03-31 NOTE — Progress Notes (Signed)
 Hematology/Oncology Progress note [phone] [phone]     Chief Complaint: Sarah [name] is a 68 y.o. 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  Light chain ratio is normal. 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 Repeat immunofixation negative. . Labs reviewed and discussed with patient, stable M protein and stable light chain ratio Off Daratumumab  due to frequent infections  Continue acyclovir  prophylaxis.  B12 deficiency continue B12 injections Q 6 weeks.   Hypomagnesemia #Chronic hypomagnesemia proceed with  IV magnesium  today.  Continue weekly magnesium  +/- IV magnesium .     Iron  deficiency anemia due to chronic blood loss Lab Results  Component Value Date   HGB 11.7 (L) 03/31/2024   TIBC 368 01/07/2024   IRONPCTSAT 16 01/07/2024   FERRITIN 46 01/07/2024    Hb is stable   Stage 4 chronic kidney disease (HCC) avoid nephrotoxins.    Acute bronchitis Chest x-ray is negative for acute process. Suspect that patient's symptoms due to bacterial bronchitis superimposed on upper respiratory viral infection.  Recommend patient to finish 10-day course of Augmentin . Tessalon  as needed for cough. Patient request coverage of vaginal candidiasis while on antibiotics.  Given her prolonged QTc, we will hold off fluconazole .  Recommend clotrimazole  vaginal cream.     Orders Placed This Encounter  Procedures   DG Chest 2 View    Standing Status:   Future    Number of Occurrences:   1    Expected Date:   03/31/2024    Expiration Date:    03/31/2025    Reason for Exam (SYMPTOM  OR DIAGNOSIS REQUIRED):   cough    Preferred imaging location?:   [facility]   CBC with Differential (Cancer Center Only)    Standing Status:   Future    Expected Date:   05/12/2024    Expiration Date:   08/10/2024   CMP (Cancer Center only)    Standing Status:   Future    Expected Date:   05/12/2024    Expiration Date:   08/10/2024   Kappa/lambda light chains    Standing Status:   Future    Expected Date:   05/12/2024    Expiration Date:   08/10/2024   Multiple Myeloma Panel (SPEP&IFE w/QIG)    Standing Status:   Future    Expected Date:   05/12/2024    Expiration Date:   08/10/2024    Follow up  Lab Mag weekly x5  + IV mag.  6 weeks flex lab MD IV mag,   All questions were answered. The patient knows to call the clinic with any problems, questions or concerns.  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   Initial bone marrow revealed 80% plasma cells.  Lambda free light chains were 1340.  She had nephrotic range proteinuria.  She initially underwent induction with RVD.  Revlimid  maintenance was discontinued on 01/21/2017 secondary to intolerance.     07/19/2019 Bone marrow aspirate and biopsy on  revealed a normocellular marrow with but increased lambda-restricted plasma cells (9% aspirate, 40% CD138 immunohistochemistry).  Findings were consistent with recurrent plasma cell myeloma.  Flow cytometry  revealed no monoclonal B-cell or phenotypically aberrant T-cell population. Cytogenetics were 29, XX (normal).  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    Lambda light chains have been followed: 22.2 (ratio 0.56) on 07/03/2017, 30.8 (ratio 0.78) on 09/02/2017, 36.9 (ratio 0.40) on 10/21/2017, 37.4 (ratio 0.41) on 12/16/2017, 70.7(ratio 0.31)  on 02/17/2018, 64.2 (ratio 0.27) on 04/07/2018, 78.9 (ratio 0.18) on 05/26/2018, 128.8 (ratio 0.17) on 08/06/2018, 181.5 (ratio 0.13) on 10/08/2018, 130.9 (ratio 0.13) on 10/20/2018, 160.7 (ratio 0.10) on 12/09/2018, 236.6 (ratio 0.07) on 02/01/2019, 363.6 (ratio 0.04) on 03/22/2019, 404.8 (ratio 0.04) on 04/05/2019, 420.7 (ratio 0.03) on 05/24/2019, 573.4 (ratio 0.03) on 06/23/2019, 451.05 (ratio 0.02) on 08/20/2019, 47.2 (ratio 0.13) on 10/25/2019, 22.4 (ratio 0.21) on 11/25/2019, 16.5 (ratio 0.33) on 01/10/2020, 14.6 (ratio 0.34) on 02/07/2020, 13.1 (ratio 0.31) on 03/07/2020, 10.1 (ratio 0.38) on 04/10/2020, and 9.5 (ratio 0.21) on 06/19/2020.   24 hour UPEP on 06/03/2019 revealed kappa free light chains 95.76, lambda free light chains 1,260.71, and ratio 0.08.  24 hour UPEP on 08/23/2019 revealed total protein of 782 mg/24 hrs with lambda free light chains 1,084.16 mg/L and ratio of 0.10 (1.03-31.76).  M spike in urine was 46.1% (361 mg/24 hrs).    Bone survey on 04/08/2016 and 05/28/2017 revealed no definite lytic lesion seen in the visualized skeleton.  Bone survey on 11/19/2018 revealed no suspicious lucent lesions and no acute bony abnormality.  PET scan on 07/12/2019 revealed no focal metabolic activity to suggest active myeloma within the skeleton. There were no lytic lesions identified on the CT portion of the exam or soft tissue plasmacytomas. There was no evidence of multiple myeloma.    Pretreatment RBC phenotype on 09/23/2019 was positive for C, e, DUFFY B, KIDD B, M, S, and s antigen; negative for c, E, KELL, DUFFY A, KIDD A, and N  antigen.    09/27/2019 - 10/25/2019; 12/09/2019 - 03/13/2020 6 cycles of daratumumab  and hyaluronidase -fihj, Pomalyst , and Decadron  (DPd) .  Cycle #1 was complicated by fever and neutropenia requiring admission.  Cycle #2 was complicated by pneumonia requiring admission.  Cycle #6 was complicated with an ER evaluation for an elevated lactic acid.   05/10/2020 second autologous stem cell transplant at [facility]   She underwent conditioning with melphalan 140 mg/m2.    She is s/p week #3 Velcade  maintenance (began 08/31/2020 - 09/28/2020).  She has no increase in neuropathy.  She has severe aortic stenosis.  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 Cardiology is following for a possible TAVR in the future.  Echo on 07/05/2020 revealed moderate to severe aortic stenosis with an EF of 50-55%.  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 Zometa  was discontinued in 01/2017.  She has chronic nausea on Phenergan .     B12 deficiency.  B12 was 254 on 04/09/2017, 295 on 08/20/2018, and 391 on 10/08/2018.  She was on oral B12.  She received B12 monthly (last 09/14/2020).  Folate was 12.1 on 01/10/2020.    She has iron  deficiency.  Ferritin was 32 on 07/01/2019.  She received Venofer  on 07/15/2019 and 07/22/2019.   She has hypogammaglobulinemia.  IgG was 245 on 11/25/2019.  She received monthly IVIG (07/22/021 - 03/22/2020).  She received IVIG 400 mg/kg on 01/20/2020, 200 mg/kg on 02/17/2020, and 300 mg/kg on 03/22/2020.  IVIG on 01/20/2020 was complicated by acute renal failure. IgG trough level was 418 on 02/17/2020.  10/15/2019 - 10/20/2019 She was admitted to [facility] with fever and neutropenia.  Cultures were negative.  CXR was negative. She received broad spectrum antibiotics and daily Granix .  She received IVF for acute renal insufficiency due to diarrhea and dehydration.  Creatine was 1.66 on admission and 1.12 on  discharge.    03/01/2020 - 03/05/2020 admitted to [facility] from with fever and neutropenia. CXR revealed no active cardiopulmonary disease. Chest CT with contrast revealed no acute intrathoracic pathology. There were findings which could be suggestive of prior granulomatous disease. She was treated with Cefepime  and Vancomycin , then switched to ciprofloxacin  on 03/03/2020.   05/08/2020 - 12/05/2021The patient was admitted to [facility] from  for autologous bone marrow transplant.   She received conditioning with melphalan 140 mg/m2.  Bone marrow transplant was on 05/10/2020. The patient developed fevers daily from 05/19/2020 - 05/24/2020. Blood cultures were + for strept sanguis bacteremia.  She was treated cefepime , vancomycin , and solumedrol for possible engraftment syndrome. Cefepime  was switched to ceftriaxone ; she completed 2 weeks of ceftriaxone  on 06/03/2020. She had chemotherapy induced diarrhea.  She experienced urinary retention.  Feb 2022 patient has been on maintenance Velcade  2 mg every 2 weeks.  Chronic diarrhea seen by gastroenterology Dr. Therisa and was recommended to avoid artificial sugars in her diet including Diet Coke and coffee mate.  She feels some improvement of her diarrhea frequency since the dietary modification.  GI profile PCR negative, C. difficile toxin A+ B negative, fecal calprotectin 77 12/19/2020 colonoscopy showed 2 subcentimeter polyps in the ascending colon, resected and removed.  Pathology showed tubular adenoma.  No malignancy.  Normal mucosa in the entire examined colon.  Biopsied, pathology showed colonic mucosa with no significant pathological alteration.  Negative for active inflammation and features of chronicity.  Negative for microscopic colitis, dysplasia, and malignanc  Diabetes, patient has discontinued metformin  due to diarrhea and started on glipizide  2.5 mg   03/05/2021-03/09/2021 Patient was hospitalized due to fever and chills, nonproductive cough, shortness of  breath. X-ray showed right lower lobe atelectasis versus pneumonia.  Blood cultures negative.  Urine culture showed 60,000 colonies of Enterococcus facialis.  Patient received antibiotics. Patient also had abdominal pelvis CT scan for work-up of intermittent lower abdomen pain.No acute abnormality.   05/14/21 last dose of Velcade  maintenance.  establish care with neurology Dr. Lane Hussar and her neuropathy regimen has been switched from gabapentin  to Lyrica . 05/29/2021 patient got influenza   neuropathy medication has been switched from gabapentin /Lyrica  to nortriptyline.  08/18/2021 Hospitalized due to pneumonia from metapnuemovirus, hospitalization was complicated with NSTEMI. She received heparin  gtt.  Treated with antibiotics for coverage of pneumonia,  diuretics for pulmonary edema. She received IVIG 400mg /kg x 1 for hypoglobulinemia. Discharged on 08/25/21 with Augmentin  and Zithromax .   May 2023, Daratumumab  being held for [facility] infectious disease physician Dr. Lorrene due to frequent infection.  Diagnosed with CHF [HFrEF] -NYHA class II-III, she follows up with cardiology, started on entresto   hospitalization due to CHF exacerbation, community acquired pneumonia.   Presented to emergency room on 07/01/2022 Respiratory panel RT-PCR negative for COVID-19, influenza and RSV.  + CHF [nonischemic cardiomyopathy], aortic stenosis, follows up with cardiology. 11/06/2023 s/p TAVR for aortic valve stenosis.  INTERVAL HISTORY Sarah [name] is a 68 y.o. female who has above history reviewed by me today presents for follow up visit for management of multiple myeloma.  Patient has been on weekly magnesium  infusion as needed hypomagnesemia + cough for the past 3 weeks, cough up yellowish sputum. Respiratory panel showed Rhinovirus. She was prescribed Augmentin  and she took 2 doses and stopped per instruction due to viral infection.  No fever or chills.    Past Medical History:  Diagnosis Date    Anemia    Anxiety    Aortic stenosis    a. 05/2020 Echo: EF 45-50%, sev AS - seen by TAVR team @ [facility] - CTA sugg of tricuspid valve w/ fusing of 2 leaflets-TAVR deferred in setting of acute infxn; b. 07/2020 Echo: EF 50-55%, mod-sev AS; c. 12/2020 Echo: EF>55%. Mod-sev paradoxical low-flow low-gradient AS; d. 08/2021 Echo: EF 35-40%, severe AS, triv AI, mild MR.   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 Variable EF over time; b. 08/2017 Echo: EF 40%; b. 03/2020 Echo: EF 55-60%; c. 05/2020 Echo: EF 45-50%; d. 07/2020 Echo: EF 50-55%; e. 12/2020 Echo: EF>55%; f. 08/2021 Echo: EF 35-40%.   CHF (congestive heart failure) (HCC)    Chronic heart failure with preserved ejection fraction (HFpEF) (HCC)    a. Variable EF over time; b. 08/2017 Echo: EF 40%; b. 03/2020 Echo: EF 55-60%; c. 05/2020 Echo: EF 45-50%; d. 07/2020 Echo: EF 50-55%; e. 12/2020 Echo: EF>55%; f. 08/2021 Echo: EF 35-40%, no RV, mildly dil LA, mild MR, triv AI, severe AS.   Chronic pain syndrome 07/08/2016   CKD (chronic kidney disease) stage 3, GFR 30-59 ml/min (HCC)    Colon cancer (HCC)    Dad   Depression    Diabetes mellitus (HCC)    Dizziness    Fatty liver    Frequent falls    GERD (gastroesophageal reflux disease)    Gout    Heart murmur    History of blood transfusion    History of bone marrow transplant (HCC)    History of uterine fibroid    Hx of cardiac catheterization    a. 01/2016 Cath ([place]  - after abnl nuc): Nl cors.   Hypertension    Hypomagnesemia    IDA (iron  deficiency anemia)    Multiple myeloma (HCC)    NSTEMI (non-ST elevated myocardial infarction) (HCC) 08/21/2021   Personal history of chemotherapy    Pleurisy 04/09/2022   PSVT (paroxysmal supraventricular tachycardia)    a. S/p RFCA 1999 ([place] ).   PVC's (premature ventricular contractions)     a. Well-managed w/ bisoprolol  in outpt setting.   Renal cyst    Ulcer 2008   3 ulcers    Past Surgical History:  Procedure Laterality Date   ABDOMINAL HYSTERECTOMY     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   CARDIAC ELECTROPHYSIOLOGY MAPPING AND ABLATION     CARPAL TUNNEL RELEASE Bilateral    CHOLECYSTECTOMY  2008   COLONOSCOPY WITH PROPOFOL  N/A 05/07/2017   Procedure: COLONOSCOPY WITH PROPOFOL ;  Surgeon: Therisa Bi,  MD;  Location: [facility] ENDOSCOPY;  Service: Gastroenterology;  Laterality: N/A;   COLONOSCOPY WITH PROPOFOL  N/A 12/19/2020   Procedure: COLONOSCOPY WITH PROPOFOL ;  Surgeon: Therisa Bi, MD;  Location: [facility] ENDOSCOPY;  Service: Gastroenterology;  Laterality: N/A;   ESOPHAGOGASTRODUODENOSCOPY (EGD) WITH PROPOFOL  N/A 05/07/2017   Procedure: ESOPHAGOGASTRODUODENOSCOPY (EGD) WITH PROPOFOL ;  Surgeon: Therisa Bi, MD;  Location: [facility] ENDOSCOPY;  Service: Gastroenterology;  Laterality: N/A;   FOOT SURGERY Bilateral    INCONTINENCE SURGERY  2009   INTERSTIM IMPLANT PLACEMENT     other     over active bladder   OTHER SURGICAL HISTORY     bladder stimulator    PARTIAL HYSTERECTOMY  03/1996   fibroids   PORTA CATH INSERTION N/A 03/10/2019   Procedure: PORTA CATH INSERTION;  Surgeon: Marea Selinda RAMAN, MD;  Location: [facility] INVASIVE CV LAB;  Service: Cardiovascular;  Laterality: N/A;   TONSILLECTOMY  2007   TRANSCATHETER AORTIC VALVE REPLACEMENT, TRANSFEMORAL  11/06/2023   TUBAL LIGATION  1984    Family History  Problem Relation Age of Onset   Colon cancer Father    Renal Disease Father    Diabetes Mellitus II Father    Heart disease Father    Kidney disease Father    Melanoma Paternal Grandmother    Breast cancer Maternal Aunt 94   Anemia Mother    Heart disease Mother    Heart failure Mother    Renal Disease Mother    Congestive Heart Failure Mother    Kidney disease Mother    Heart disease Maternal Uncle    Throat cancer Maternal Uncle    Lung cancer Maternal Uncle     Liver disease Maternal Uncle    Heart failure Maternal Uncle    Hearing loss Son 23       Suicide    Heart disease Son    Hearing loss Sister    Heart disease Son     Social History:  reports that she quit smoking about 32 years ago. Her smoking use included cigarettes. She started smoking about 52 years ago. She has a 20 pack-year smoking history. She has never used smokeless tobacco. She reports that she does not currently use alcohol after a past usage of about 4.0 standard drinks of alcohol per week. She reports that she does not use drugs.  She is on disability. She notes exposure to perchloroethylene [facility]).   Allergies:  Allergies  Allergen Reactions   Other Anaphylaxis    Hycomine   Oxycodone-Acetaminophen  Anaphylaxis    Swelling and rash  acetaminophen  / oxycodone   Celebrex  [Celecoxib ] Diarrhea   Codeine     Plerixafor     In 2016 during ASCT collection patient developed fever to 103.48F and required hospitalization   Benadryl  [Diphenhydramine ] Palpitations   Morphine  Itching and Rash   Ondansetron  Diarrhea   Tylenol  [Acetaminophen ] Itching and Rash    Current Medications: Current Outpatient Medications  Medication Sig Dispense Refill   cholestyramine  (QUESTRAN ) 4 GM/DOSE powder Take 1 packet (4 g total) by mouth 2 (two) times daily with a meal. (Patient taking differently: Take 4 g by mouth 2 (two) times daily with a meal. Twice a day prn) 240 packet 2   clotrimazole  (GYNE-LOTRIMIN  3) 2 % vaginal cream Place 1 Applicatorful vaginally at bedtime. 21 g 0   diazepam  (VALIUM ) 5 MG tablet TAKE 1/2 TO 1 TABLET(2.5 TO 5 MG) BY MOUTH AT BEDTIME AS NEEDED FOR INSOMNIA (Patient taking differently: Take 5 mg by mouth every 6 (six)  hours as needed.) 30 tablet 5   traMADol  (ULTRAM ) 50 MG tablet  (Patient taking differently: PRN)     traZODone  (DESYREL ) 100 MG tablet TAKE 1 TABLET AT BEDTIME (Patient taking differently: Every other day PRN) 90 tablet 3   allopurinol  (ZYLOPRIM ) 100 MG  tablet TAKE 1 TABLET(100 MG) BY MOUTH DAILY as needed 90 tablet 1   amiodarone (PACERONE) 200 MG tablet Take 200 mg by mouth daily.     amoxicillin -clavulanate (AUGMENTIN ) 875-125 MG tablet Take 1 tablet by mouth 2 (two) times daily. 20 tablet 0   benzonatate  (TESSALON ) 100 MG capsule Take 1 capsule (100 mg total) by mouth 3 (three) times daily as needed for cough. 20 capsule 0   bisoprolol  (ZEBETA ) 10 MG tablet Take 1 tablet (10 mg total) by mouth daily. 90 tablet 1   celecoxib  (CELEBREX ) 200 MG capsule Take 200 mg by mouth daily. (Patient not taking: Reported on 03/31/2024)     diclofenac  sodium (VOLTAREN ) 1 % GEL Apply 2 g topically 4 (four) times daily. 100 g 1   diphenoxylate -atropine  (LOMOTIL ) 2.5-0.025 MG tablet Take 2 tablets by mouth 4 (four) times daily as needed for diarrhea or loose stools. 180 tablet 3   ENTRESTO  24-26 MG Take 1 tablet by mouth 2 (two) times daily.     FLUoxetine  (PROZAC ) 40 MG capsule Take 1 capsule (40 mg total) by mouth daily. 90 capsule 1   fluticasone  (FLONASE ) 50 MCG/ACT nasal spray Place 2 sprays into both nostrils daily.     furosemide  (LASIX ) 40 MG tablet Take 0.5 tablets (20 mg total) by mouth daily. Home dose.     glipiZIDE  (GLUCOTROL  XL) 2.5 MG 24 hr tablet TAKE 1 TABLET(2.5 MG) BY MOUTH DAILY WITH BREAKFAST 90 tablet 1   glucose blood test strip  (Patient not taking: Reported on 03/18/2024)     JARDIANCE 10 MG TABS tablet Take 10 mg by mouth daily.     lidocaine  (XYLOCAINE ) 2 % solution Use as directed 15 mLs in the mouth or throat every 3 (three) hours as needed for mouth pain (swish and spit). (Patient not taking: Reported on 03/18/2024) 100 mL 0   metaxalone  (SKELAXIN ) 800 MG tablet Take 1 tablet (800 mg total) by mouth in the morning. (Patient taking differently: Take 800 mg by mouth in the morning. PRN) 30 tablet 0   Multiple Vitamin (MULTIVITAMIN) tablet Take 1 tablet by mouth daily.     nystatin  cream (MYCOSTATIN ) Apply 1 Application topically 2 (two)  times daily. To rash in abdominal folds. (Patient taking differently: Apply 1 Application topically 2 (two) times daily. To rash in abdominal folds. PRN) 30 g 0   omeprazole  (PRILOSEC) 40 MG capsule TAKE 1 CAPSULE IN THE MORNING AND TAKE 1 CAPSULE AT BEDTIME 180 capsule 3   potassium chloride  SA (KLOR-CON  M) 20 MEQ tablet Take 1 tablet (20 mEq total) by mouth daily. 7 tablet 0   promethazine  (PHENERGAN ) 25 MG tablet TAKE 1 TABLET(25 MG) BY MOUTH EVERY 6 HOURS AS NEEDED FOR NAUSEA OR VOMITING 90 tablet 1   rosuvastatin  (CRESTOR ) 5 MG tablet Take 1 tablet (5 mg total) by mouth daily. 90 tablet 1   spironolactone (ALDACTONE) 25 MG tablet Take by mouth.     tiZANidine  (ZANAFLEX ) 4 MG tablet Take 1 tablet (4 mg total) by mouth at bedtime. 90 tablet 0   VITAMIN D , CHOLECALCIFEROL, PO Take 2,000 Units by mouth daily.     No current facility-administered medications for this visit.  Facility-Administered Medications Ordered in Other Visits  Medication Dose Route Frequency Provider Last Rate Last Admin   0.9 %  sodium chloride  infusion   Intravenous Continuous Babara Call, MD   Stopped at 03/31/24 1322   sodium chloride  flush (NS) 0.9 % injection 10 mL  10 mL Intravenous PRN Babara Call, MD   10 mL at 04/02/21 0904   sodium chloride  flush (NS) 0.9 % injection 10 mL  10 mL Intracatheter Once PRN Babara Call, MD        Review of Systems  Constitutional:  Negative for chills, fever, malaise/fatigue and weight loss.  HENT:  Negative for congestion and sore throat.   Eyes:  Negative for redness.  Respiratory:  Negative for cough, shortness of breath and wheezing.   Cardiovascular:  Negative for chest pain, palpitations and leg swelling.  Gastrointestinal:  Positive for diarrhea. Negative for abdominal pain, blood in stool, nausea and vomiting.  Genitourinary:  Negative for dysuria.  Musculoskeletal:  Positive for back pain and joint pain. Negative for myalgias.  Skin:  Negative for rash.  Neurological:   Positive for tingling and sensory change. Negative for dizziness and tremors.  Endo/Heme/Allergies:  Does not bruise/bleed easily.  Psychiatric/Behavioral:  Negative for hallucinations.     Performance status (ECOG): 1  Vitals Blood pressure (!) 146/68, pulse 60, temperature 97.6 F (36.4 C), temperature source Tympanic, resp. rate 16, weight 163 lb (73.9 kg), SpO2 99%.  Physical Exam Constitutional:      General: She is not in acute distress.    Appearance: She is not diaphoretic.  HENT:     Head: Normocephalic and atraumatic.  Eyes:     General: No scleral icterus. Cardiovascular:     Rate and Rhythm: Normal rate.     Heart sounds: Murmur heard.  Pulmonary:     Effort: Pulmonary effort is normal. No respiratory distress.     Breath sounds: Normal breath sounds. No wheezing.  Abdominal:     General: There is no distension.  Musculoskeletal:        General: Normal range of motion.     Cervical back: Normal range of motion and neck supple.     Comments: Bilateral SI joint tenderness.   Skin:    General: Skin is warm and dry.     Findings: No erythema.  Neurological:     Mental Status: She is alert and oriented to person, place, and time. Mental status is at baseline.     Motor: No abnormal muscle tone.  Psychiatric:        Mood and Affect: Mood and affect normal.      Laboratory data    Latest Ref Rng & Units 03/31/2024    9:31 AM 03/24/2024    9:41 AM 02/18/2024    9:35 AM  CBC  WBC 4.0 - 10.5 K/uL 5.2  9.3  4.7   Hemoglobin 12.0 - 15.0 g/dL 88.2  87.6  88.0   Hematocrit 36.0 - 46.0 % 34.9  36.5  34.8   Platelets 150 - 400 K/uL 117  106  89       Latest Ref Rng & Units 03/31/2024    9:31 AM 02/18/2024    9:35 AM 01/07/2024    8:33 AM  CMP  Glucose 70 - 99 mg/dL 880  883  852   BUN 8 - 23 mg/dL 28  34  22   Creatinine 0.44 - 1.00 mg/dL 8.46  8.50  8.76   Sodium 135 -  145 mmol/L 138  136  135   Potassium 3.5 - 5.1 mmol/L 3.8  3.9  4.1   Chloride 98 - 111 mmol/L  110  105  106   CO2 22 - 32 mmol/L 19  22  22    Calcium  8.9 - 10.3 mg/dL 8.8  9.0  9.0   Total Protein 6.5 - 8.1 g/dL 7.0  7.1  7.1   Total Bilirubin 0.0 - 1.2 mg/dL 0.7  0.4  0.4   Alkaline Phos 38 - 126 U/L 98  81  87   AST 15 - 41 U/L 27  31  41   ALT 0 - 44 U/L 31  33  39

## 2024-03-31 NOTE — Assessment & Plan Note (Signed)
 Lab Results  Component Value Date   HGB 11.7 (L) 03/31/2024   TIBC 368 01/07/2024   IRONPCTSAT 16 01/07/2024   FERRITIN 46 01/07/2024    Hb is stable

## 2024-03-31 NOTE — Assessment & Plan Note (Signed)
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  Light chain ratio is normal. 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 Repeat immunofixation negative. . Labs reviewed and discussed with patient, stable M protein and stable light chain ratio Off Daratumumab due to frequent infections  Continue acyclovir prophylaxis.

## 2024-04-01 LAB — KAPPA/LAMBDA LIGHT CHAINS
Kappa free light chain: 25.8 mg/L — ABNORMAL HIGH (ref 3.3–19.4)
Kappa, lambda light chain ratio: 0.82 (ref 0.26–1.65)
Lambda free light chains: 31.6 mg/L — ABNORMAL HIGH (ref 5.7–26.3)

## 2024-04-03 LAB — MULTIPLE MYELOMA PANEL, SERUM
Albumin SerPl Elph-Mcnc: 3.8 g/dL (ref 2.9–4.4)
Albumin/Glob SerPl: 1.3 (ref 0.7–1.7)
Alpha 1: 0.3 g/dL (ref 0.0–0.4)
Alpha2 Glob SerPl Elph-Mcnc: 1.1 g/dL — ABNORMAL HIGH (ref 0.4–1.0)
B-Globulin SerPl Elph-Mcnc: 1.2 g/dL (ref 0.7–1.3)
Gamma Glob SerPl Elph-Mcnc: 0.6 g/dL (ref 0.4–1.8)
Globulin, Total: 3.1 g/dL (ref 2.2–3.9)
IgA: 298 mg/dL (ref 87–352)
IgG (Immunoglobin G), Serum: 714 mg/dL (ref 586–1602)
IgM (Immunoglobulin M), Srm: 28 mg/dL (ref 26–217)
Total Protein ELP: 6.9 g/dL (ref 6.0–8.5)

## 2024-04-07 ENCOUNTER — Other Ambulatory Visit: Payer: Self-pay | Admitting: Oncology

## 2024-04-07 ENCOUNTER — Inpatient Hospital Stay

## 2024-04-07 VITALS — BP 140/63 | HR 67 | Temp 98.8°F | Resp 16

## 2024-04-07 DIAGNOSIS — C9001 Multiple myeloma in remission: Secondary | ICD-10-CM

## 2024-04-07 LAB — MAGNESIUM: Magnesium: 1.4 mg/dL — ABNORMAL LOW (ref 1.7–2.4)

## 2024-04-07 MED ORDER — MAGNESIUM SULFATE 4 GM/100ML IV SOLN
4.0000 g | Freq: Once | INTRAVENOUS | Status: AC
  Administered 2024-04-07: 4 g via INTRAVENOUS
  Filled 2024-04-07: qty 100

## 2024-04-07 MED ORDER — SODIUM CHLORIDE 0.9 % IV SOLN
12.5000 mg | Freq: Once | INTRAVENOUS | Status: AC
  Administered 2024-04-07: 12.5 mg via INTRAVENOUS
  Filled 2024-04-07: qty 0.5

## 2024-04-07 MED ORDER — PROMETHAZINE-DM 6.25-15 MG/5ML PO SYRP
5.0000 mL | ORAL_SOLUTION | Freq: Four times a day (QID) | ORAL | 0 refills | Status: DC | PRN
Start: 2024-04-07 — End: 2024-04-21

## 2024-04-07 MED ORDER — SODIUM CHLORIDE 0.9 % IV SOLN
INTRAVENOUS | Status: DC
  Filled 2024-04-07: qty 250

## 2024-04-07 MED ORDER — ALBUTEROL SULFATE HFA 108 (90 BASE) MCG/ACT IN AERS: 2.0000 | INHALATION_SPRAY | Freq: Four times a day (QID) | RESPIRATORY_TRACT | 0 refills | Status: AC | PRN

## 2024-04-07 NOTE — Progress Notes (Signed)
 Notified Dr. Babara. Pt is here and they still have an awful cough with wheezing that keeps them up at night. pt states I am not coughing up much anymore and I have had this cough for 4 weeks. Pt is requesting something for the cough please. Pt requesting cough medicine to relieve cough especially at night time not getting much sleep.  Dr. Babara sent prescriptions to pt. Pharmacy.

## 2024-04-13 MED FILL — Promethazine HCl Inj 25 MG/ML: INTRAMUSCULAR | Qty: 0.5 | Status: AC

## 2024-04-14 ENCOUNTER — Inpatient Hospital Stay

## 2024-04-14 VITALS — BP 136/58 | HR 49 | Temp 98.5°F | Resp 16

## 2024-04-14 DIAGNOSIS — C9001 Multiple myeloma in remission: Secondary | ICD-10-CM

## 2024-04-14 LAB — MAGNESIUM: Magnesium: 1.5 mg/dL — ABNORMAL LOW (ref 1.7–2.4)

## 2024-04-14 MED ORDER — SODIUM CHLORIDE 0.9 % IV SOLN
INTRAVENOUS | Status: DC
  Filled 2024-04-14: qty 250

## 2024-04-14 MED ORDER — MAGNESIUM SULFATE 4 GM/100ML IV SOLN
4.0000 g | Freq: Once | INTRAVENOUS | Status: AC
  Administered 2024-04-14: 4 g via INTRAVENOUS
  Filled 2024-04-14: qty 100

## 2024-04-14 MED ORDER — SODIUM CHLORIDE 0.9 % IV SOLN
12.5000 mg | Freq: Once | INTRAVENOUS | Status: AC
  Administered 2024-04-14: 12.5 mg via INTRAVENOUS
  Filled 2024-04-14: qty 0.5

## 2024-04-20 MED FILL — Promethazine HCl Inj 25 MG/ML: INTRAMUSCULAR | Qty: 0.5 | Status: AC

## 2024-04-21 ENCOUNTER — Encounter: Payer: Self-pay | Admitting: Hospice and Palliative Medicine

## 2024-04-21 ENCOUNTER — Inpatient Hospital Stay: Admitting: Hospice and Palliative Medicine

## 2024-04-21 ENCOUNTER — Inpatient Hospital Stay

## 2024-04-21 ENCOUNTER — Other Ambulatory Visit: Payer: Self-pay

## 2024-04-21 VITALS — BP 133/66 | HR 57 | Temp 98.4°F | Resp 16

## 2024-04-21 DIAGNOSIS — J069 Acute upper respiratory infection, unspecified: Secondary | ICD-10-CM | POA: Diagnosis not present

## 2024-04-21 DIAGNOSIS — C9001 Multiple myeloma in remission: Secondary | ICD-10-CM

## 2024-04-21 LAB — MAGNESIUM: Magnesium: 1.6 mg/dL — ABNORMAL LOW (ref 1.7–2.4)

## 2024-04-21 MED ORDER — MAGNESIUM SULFATE 2 GM/50ML IV SOLN
2.0000 g | Freq: Once | INTRAVENOUS | Status: AC
  Administered 2024-04-21: 2 g via INTRAVENOUS
  Filled 2024-04-21: qty 50

## 2024-04-21 MED ORDER — SODIUM CHLORIDE 0.9 % IV SOLN
12.5000 mg | Freq: Once | INTRAVENOUS | Status: AC
  Administered 2024-04-21: 12.5 mg via INTRAVENOUS
  Filled 2024-04-21: qty 0.5

## 2024-04-21 MED ORDER — SODIUM CHLORIDE 0.9 % IV SOLN
INTRAVENOUS | Status: DC
  Filled 2024-04-21: qty 250

## 2024-04-21 NOTE — Progress Notes (Signed)
 Pt noticed a lump on the right side of their jaw, reporting it hurts a little bit. Notified Dr. Babara for further recommendations.

## 2024-04-21 NOTE — Progress Notes (Signed)
 Symptom Management Clinic [facility] at [facility] [phone] [phone]) [phone]  Patient Care Team: Sarah Leita DEL, MD as PCP - General (Internal Medicine) Sarah Call, MD as Consulting Physician (Oncology) Sarah Lonni MATSU, MD as Referring Physician (Ophthalmology) Sarah Donnice Righter, MD as Referring Physician (Cardiology) Sarah [name], Mun[place]or, MD (Nephrology) Sarah Bi, MD as Consulting Physician (Gastroenterology)   NAME OF PATIENT: Sarah [name]  [id number]  Oct 16, 1955   DATE OF VISIT: 04/21/24  REASON FOR CONSULT: Sarah [name] is a 68 y.o. female with multiple medical problems including multiple myeloma in remission status post bone marrow transplant on 05/10/2020.  Patient off Dara due to frequent infections.  INTERVAL HISTORY: Patient was here for fluids.  Was an add-on to [facility] for evaluation of uncomfortable mass beneath her jaw, which patient says she noticed for Dennise this morning.  Denies facial swelling.  Denies any changes to dentition.  Still has not seen her dentist.  Does endorse several days of rhinorrhea and sore throat.  She does have cough with clear sputum.  Denies any fever or chills.  Denies any neurologic complaints. Denies any easy bleeding or bruising. Reports good appetite and denies weight loss. Denies chest pain. Denies urinary complaints. Patient offers no further specific complaints today.   PAST MEDICAL HISTORY: Past Medical History:  Diagnosis Date   Anemia    Anxiety    Aortic stenosis    a. 05/2020 Echo: EF 45-50%, sev AS - seen by TAVR team @ [facility] - CTA sugg of tricuspid valve w/ fusing of 2 leaflets-TAVR deferred in setting of acute infxn; b. 07/2020 Echo: EF 50-55%, mod-sev AS; c. 12/2020 Echo: EF>55%. Mod-sev paradoxical low-flow low-gradient AS; d. 08/2021 Echo: EF 35-40%, severe AS, triv AI, mild MR.   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 Variable EF over time; b. 08/2017 Echo: EF 40%; b. 03/2020 Echo: EF 55-60%; c. 05/2020 Echo: EF 45-50%; d. 07/2020 Echo: EF 50-55%; e. 12/2020 Echo: EF>55%; f. 08/2021 Echo: EF 35-40%.   CHF (congestive heart failure) (HCC)    Chronic heart failure with preserved ejection fraction (HFpEF) (HCC)    a. Variable EF over time; b. 08/2017 Echo: EF 40%; b. 03/2020 Echo: EF 55-60%; c. 05/2020 Echo: EF 45-50%; d. 07/2020 Echo: EF 50-55%; e. 12/2020 Echo: EF>55%; f. 08/2021 Echo: EF 35-40%, no RV, mildly dil LA, mild MR, triv AI, severe AS.   Chronic pain syndrome 07/08/2016   CKD (chronic kidney disease) stage 3, GFR 30-59 ml/min (HCC)    Colon cancer (HCC)    Dad   Depression    Diabetes mellitus (HCC)    Dizziness    Fatty liver    Frequent falls    GERD (gastroesophageal reflux disease)    Gout    Heart murmur    History of blood transfusion    History of bone marrow transplant (HCC)    History of uterine fibroid    Hx of cardiac catheterization    a. 01/2016 Cath ([place]  - after abnl nuc): Nl cors.   Hypertension    Hypomagnesemia    IDA (iron  deficiency anemia)    Multiple myeloma (HCC)    NSTEMI (non-ST elevated myocardial infarction) (HCC) 08/21/2021   Personal history of chemotherapy    Pleurisy 04/09/2022   PSVT (  paroxysmal supraventricular tachycardia)    a. S/p RFCA 1999 ([place] ).   PVC's (premature ventricular contractions)    a. Well-managed w/ bisoprolol  in outpt setting.   Renal cyst    Ulcer 2008   3 ulcers    PAST SURGICAL HISTORY:  Past Surgical History:  Procedure Laterality Date   ABDOMINAL HYSTERECTOMY     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   CARDIAC ELECTROPHYSIOLOGY MAPPING AND ABLATION     CARPAL TUNNEL RELEASE Bilateral    CHOLECYSTECTOMY  2008   COLONOSCOPY WITH PROPOFOL  N/A 05/07/2017   Procedure: COLONOSCOPY WITH PROPOFOL ;   Surgeon: Sarah Bi, MD;  Location: [facility] ENDOSCOPY;  Service: Gastroenterology;  Laterality: N/A;   COLONOSCOPY WITH PROPOFOL  N/A 12/19/2020   Procedure: COLONOSCOPY WITH PROPOFOL ;  Surgeon: Sarah Bi, MD;  Location: [facility] ENDOSCOPY;  Service: Gastroenterology;  Laterality: N/A;   ESOPHAGOGASTRODUODENOSCOPY (EGD) WITH PROPOFOL  N/A 05/07/2017   Procedure: ESOPHAGOGASTRODUODENOSCOPY (EGD) WITH PROPOFOL ;  Surgeon: Sarah Bi, MD;  Location[facility] ENDOSCOPY;  Service: Gastroenterology;  Laterality: N/A;   FOOT SURGERY Bilateral    INCONTINENCE SURGERY  2009   INTERSTIM IMPLANT PLACEMENT     other     over active bladder   OTHER SURGICAL HISTORY     bladder stimulator    PARTIAL HYSTERECTOMY  03/1996   fibroids   PORTA CATH INSERTION N/A 03/10/2019   Procedure: PORTA CATH INSERTION;  Surgeon: Marea Selinda RAMAN, MD;  Location: [facility] INVASIVE CV LAB;  Service: Cardiovascular;  Laterality: N/A;   TONSILLECTOMY  2007   TRANSCATHETER AORTIC VALVE REPLACEMENT, TRANSFEMORAL  11/06/2023   TUBAL LIGATION  1984    HEMATOLOGY/ONCOLOGY HISTORY:  Oncology History Overview Note  # June 2017- IgALamda MULTIPLE MYELOMA [BMBx- 80% plasma cells; [facility]]; Lamda light chain 1340; s/p RVD   # 14th DEC 2016- Auto-Stem cell transplant [[facility]  [place] ;Dr.Hertzig]; rev [dose reduced sec to Diarrhea]  # Maintenance Revlimid - 5mg  3 w-ON & 1 week OFF; HELD July 2018- sec to ONJ  # Bone lesions-numerous ~1mm lytic lesions in Thoracic spine- on Zometa   # July-Aug 2018- OSTEONECROSIS OF JAW- DISCONTINUED ZOMETA   # June 2016- Proteinuria [3gm/day]/ CKD III   # chronic back pain/ anxiety/ PN  # 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  # Colitis s/p multiple EGD/Colonoscopies; EGD- patchy inflammation/colo- Nov 2018 [Dr.Anna]  DIAGNOSIS: Multiple myeloma  GOALS: Control/palliative  CURRENT/MOST RECENT THERAPY -surveillance    Multiple myeloma in remission (HCC)   09/27/2019 - 04/10/2020 Chemotherapy         08/31/2020 - 05/14/2021 Chemotherapy   Patient is on Treatment Plan : MYELOMA MAINTENANCE Bortezomib  SQ q14d     05/30/2021 - 05/30/2021 Chemotherapy   Patient is on Treatment Plan : MYELOMA NEWLY DIAGNOSED TRANSPLANT CANDIDATE Daratumumab  SQ + Lenalidomide  q28d (Maintenance)     05/30/2021 -  Chemotherapy   Patient is on Treatment Plan : MYELOMA NEWLY DIAGNOSED TRANSPLANT CANDIDATE Daratumumab  SQ + Lenalidomide  q28d (Maintenance)     06/06/2021 - 10/29/2021 Chemotherapy   Patient is on Treatment Plan : MYELOMA POST  TRANSPLANT Daratumumab  SQ (Maintenance)     Multiple myeloma in remission (HCC)  05/14/2021 Initial Diagnosis   Multiple myeloma in remission (HCC)   05/30/2021 - 05/30/2021 Chemotherapy   Patient is on Treatment Plan : MYELOMA NEWLY DIAGNOSED TRANSPLANT CANDIDATE Daratumumab  SQ + Lenalidomide  q28d (Maintenance)     05/30/2021 -  Chemotherapy   Patient is on Treatment Plan : MYELOMA NEWLY DIAGNOSED TRANSPLANT CANDIDATE Daratumumab  SQ +  Lenalidomide  q28d (Maintenance)     06/06/2021 - 10/29/2021 Chemotherapy   Patient is on Treatment Plan : MYELOMA POST  TRANSPLANT Daratumumab  SQ (Maintenance)       ALLERGIES:  is allergic to other, oxycodone-acetaminophen , celebrex  [celecoxib ], codeine , plerixafor, benadryl  [diphenhydramine ], morphine , ondansetron , and tylenol  [acetaminophen ].  MEDICATIONS:  Current Outpatient Medications  Medication Sig Dispense Refill   albuterol  (VENTOLIN  HFA) 108 (90 Base) MCG/ACT inhaler Inhale 2 puffs into the lungs every 6 (six) hours as needed for wheezing or shortness of breath. 17 each 0   allopurinol  (ZYLOPRIM ) 100 MG tablet TAKE 1 TABLET(100 MG) BY MOUTH DAILY as needed 90 tablet 1   amiodarone (PACERONE) 200 MG tablet Take 200 mg by mouth daily.     amoxicillin -clavulanate (AUGMENTIN ) 875-125 MG tablet Take 1 tablet by mouth 2 (two) times daily. 20 tablet 0   benzonatate  (TESSALON ) 100 MG capsule  Take 1 capsule (100 mg total) by mouth 3 (three) times daily as needed for cough. 20 capsule 0   bisoprolol  (ZEBETA ) 10 MG tablet Take 1 tablet (10 mg total) by mouth daily. 90 tablet 1   celecoxib  (CELEBREX ) 200 MG capsule Take 200 mg by mouth daily. (Patient not taking: Reported on 03/31/2024)     cholestyramine  (QUESTRAN ) 4 GM/DOSE powder Take 1 packet (4 g total) by mouth 2 (two) times daily with a meal. (Patient taking differently: Take 4 g by mouth 2 (two) times daily with a meal. Twice a day prn) 240 packet 2   clotrimazole  (GYNE-LOTRIMIN  3) 2 % vaginal cream Place 1 Applicatorful vaginally at bedtime. 21 g 0   diazepam  (VALIUM ) 5 MG tablet TAKE 1/2 TO 1 TABLET(2.5 TO 5 MG) BY MOUTH AT BEDTIME AS NEEDED FOR INSOMNIA (Patient taking differently: Take 5 mg by mouth every 6 (six) hours as needed.) 30 tablet 5   diclofenac  sodium (VOLTAREN ) 1 % GEL Apply 2 g topically 4 (four) times daily. 100 g 1   diphenoxylate -atropine  (LOMOTIL ) 2.5-0.025 MG tablet Take 2 tablets by mouth 4 (four) times daily as needed for diarrhea or loose stools. 180 tablet 3   ENTRESTO  24-26 MG Take 1 tablet by mouth 2 (two) times daily.     FLUoxetine  (PROZAC ) 40 MG capsule Take 1 capsule (40 mg total) by mouth daily. 90 capsule 1   fluticasone  (FLONASE ) 50 MCG/ACT nasal spray Place 2 sprays into both nostrils daily.     furosemide  (LASIX ) 40 MG tablet Take 0.5 tablets (20 mg total) by mouth daily. Home dose.     glipiZIDE  (GLUCOTROL  XL) 2.5 MG 24 hr tablet TAKE 1 TABLET(2.5 MG) BY MOUTH DAILY WITH BREAKFAST 90 tablet 1   glucose blood test strip  (Patient not taking: Reported on 03/18/2024)     JARDIANCE 10 MG TABS tablet Take 10 mg by mouth daily.     lidocaine  (XYLOCAINE ) 2 % solution Use as directed 15 mLs in the mouth or throat every 3 (three) hours as needed for mouth pain (swish and spit). (Patient not taking: Reported on 03/18/2024) 100 mL 0   metaxalone  (SKELAXIN ) 800 MG tablet Take 1 tablet (800 mg total) by mouth in  the morning. (Patient taking differently: Take 800 mg by mouth in the morning. PRN) 30 tablet 0   Multiple Vitamin (MULTIVITAMIN) tablet Take 1 tablet by mouth daily.     nystatin  cream (MYCOSTATIN ) Apply 1 Application topically 2 (two) times daily. To rash in abdominal folds. (Patient taking differently: Apply 1 Application topically 2 (two) times daily. To rash  in abdominal folds. PRN) 30 g 0   omeprazole  (PRILOSEC) 40 MG capsule TAKE 1 CAPSULE IN THE MORNING AND TAKE 1 CAPSULE AT BEDTIME 180 capsule 3   potassium chloride  SA (KLOR-CON  M) 20 MEQ tablet Take 1 tablet (20 mEq total) by mouth daily. 7 tablet 0   promethazine -dextromethorphan  (PROMETHAZINE -DM) 6.25-15 MG/5ML syrup Take 5 mLs by mouth 4 (four) times daily as needed for cough. 118 mL 0   rosuvastatin  (CRESTOR ) 5 MG tablet Take 1 tablet (5 mg total) by mouth daily. 90 tablet 1   spironolactone (ALDACTONE) 25 MG tablet Take by mouth.     tiZANidine  (ZANAFLEX ) 4 MG tablet Take 1 tablet (4 mg total) by mouth at bedtime. 90 tablet 0   traZODone  (DESYREL ) 100 MG tablet TAKE 1 TABLET AT BEDTIME (Patient taking differently: Every other day PRN) 90 tablet 3   VITAMIN D , CHOLECALCIFEROL, PO Take 2,000 Units by mouth daily.     No current facility-administered medications for this visit.   Facility-Administered Medications Ordered in Other Visits  Medication Dose Route Frequency Provider Last Rate Last Admin   0.9 %  sodium chloride  infusion   Intravenous Continuous Sarah Call, MD 40 mL/hr at 04/21/24 0900 New Bag at 04/21/24 0900   sodium chloride  flush (NS) 0.9 % injection 10 mL  10 mL Intravenous PRN Sarah Call, MD   10 mL at 04/02/21 9095   sodium chloride  flush (NS) 0.9 % injection 10 mL  10 mL Intracatheter Once PRN Sarah Call, MD        VITAL SIGNS: There were no vitals taken for this visit. There were no vitals filed for this visit.  Estimated body mass index is 29.34 kg/m as calculated from the following:   Height as of 03/18/24: 5'  2.5 (1.588 m).   Weight as of 03/31/24: 163 lb (73.9 kg).  LABS: CBC:    Component Value Date/Time   WBC 5.2 03/31/2024 0931   WBC 7.0 09/11/2022 1006   HGB 11.7 (L) 03/31/2024 0931   HCT 34.9 (L) 03/31/2024 0931   PLT 117 (L) 03/31/2024 0931   MCV 83.7 03/31/2024 0931   NEUTROABS 3.1 03/31/2024 0931   LYMPHSABS 1.5 03/31/2024 0931   MONOABS 0.4 03/31/2024 0931   EOSABS 0.1 03/31/2024 0931   BASOSABS 0.0 03/31/2024 0931   Comprehensive Metabolic Panel:    Component Value Date/Time   NA 138 03/31/2024 0931   NA 141 04/19/2016 1604   K 3.8 03/31/2024 0931   CL 110 03/31/2024 0931   CO2 19 (L) 03/31/2024 0931   BUN 28 (H) 03/31/2024 0931   BUN 22 04/19/2016 1604   CREATININE 1.53 (H) 03/31/2024 0931   GLUCOSE 119 (H) 03/31/2024 0931   CALCIUM  8.8 (L) 03/31/2024 0931   CALCIUM  8.8 03/23/2020 1144   AST 27 03/31/2024 0931   ALT 31 03/31/2024 0931   ALKPHOS 98 03/31/2024 0931   BILITOT 0.7 03/31/2024 0931   PROT 7.0 03/31/2024 0931   ALBUMIN  4.1 03/31/2024 0931    RADIOGRAPHIC STUDIES: DG Chest 2 View Result Date: 03/31/2024 CLINICAL DATA:  Cough for 3 weeks. EXAM: CHEST - 2 VIEW COMPARISON:  June 05, 2023. FINDINGS: The heart size and mediastinal contours are within normal limits. Both lungs are clear. Right internal jugular Port-A-Cath is unchanged. Status post transcatheter aortic valve repair. The visualized skeletal structures are unremarkable. IMPRESSION: No active cardiopulmonary disease. Electronically Signed   By: Lynwood Landy Raddle M.D.   On: 03/31/2024 14:29    PERFORMANCE  STATUS (ECOG) : 1 - Symptomatic but completely ambulatory  Review of Systems Unless otherwise noted, a complete review of systems is negative.  Physical Exam General: NAD HEENT: + Lymphadenopathy, no exudate, no facial swelling or redness Cardiovascular: regular rate and rhythm Pulmonary: Cough, congested sounding anterior/posterior fields Abdomen: soft, nontender, + bowel sounds GU: no  suprapubic tenderness Extremities: no edema, no joint deformities Skin: no rashes Neurological: Weakness but otherwise nonfocal  IMPRESSION/PLAN: Multiple myeloma -on surveillance with ongoing supportive care  Poor dentition -history of osteonecrosis of the jaw.  Patient has been referred to oral surgeon but has to see her dentist first.  Likely URI -rhinorrhea with positive submandibular lymphadenopathy.  Patient recently completed course of Augmentin .  No indication for restarting antibiotics at this time.  Recommended supportive care.  Patient to inform us  if symptoms persist or worsen.   Patient expressed understanding and was in agreement with this plan. She also understands that She can [name] clinic at any time with any questions, concerns, or complaints.   Thank you for allowing me to participate in the care of this very pleasant patient.   Time Total: 15 minutes  Visit consisted of counseling and education dealing with the complex and emotionally intense issues of symptom management in the setting of serious illness.Greater than 50%  of this time was spent counseling and coordinating care related to the above assessment and plan.  Signed by: Fonda Mower, PhD, NP-C

## 2024-04-23 ENCOUNTER — Ambulatory Visit (INDEPENDENT_AMBULATORY_CARE_PROVIDER_SITE_OTHER): Admitting: Podiatry

## 2024-04-23 ENCOUNTER — Encounter: Payer: Self-pay | Admitting: Podiatry

## 2024-04-23 DIAGNOSIS — B351 Tinea unguium: Secondary | ICD-10-CM

## 2024-04-23 DIAGNOSIS — M79675 Pain in left toe(s): Secondary | ICD-10-CM | POA: Diagnosis not present

## 2024-04-23 DIAGNOSIS — M79674 Pain in right toe(s): Secondary | ICD-10-CM

## 2024-04-23 DIAGNOSIS — M5412 Radiculopathy, cervical region: Secondary | ICD-10-CM | POA: Diagnosis not present

## 2024-04-23 DIAGNOSIS — M62830 Muscle spasm of back: Secondary | ICD-10-CM | POA: Diagnosis not present

## 2024-04-23 DIAGNOSIS — M9902 Segmental and somatic dysfunction of thoracic region: Secondary | ICD-10-CM | POA: Diagnosis not present

## 2024-04-23 DIAGNOSIS — M9901 Segmental and somatic dysfunction of cervical region: Secondary | ICD-10-CM | POA: Diagnosis not present

## 2024-04-27 MED FILL — Promethazine HCl Inj 25 MG/ML: INTRAMUSCULAR | Qty: 0.5 | Status: AC

## 2024-04-28 ENCOUNTER — Inpatient Hospital Stay

## 2024-04-28 DIAGNOSIS — C9001 Multiple myeloma in remission: Secondary | ICD-10-CM

## 2024-04-28 DIAGNOSIS — E538 Deficiency of other specified B group vitamins: Secondary | ICD-10-CM | POA: Diagnosis not present

## 2024-04-28 DIAGNOSIS — D500 Iron deficiency anemia secondary to blood loss (chronic): Secondary | ICD-10-CM | POA: Diagnosis not present

## 2024-04-28 DIAGNOSIS — J209 Acute bronchitis, unspecified: Secondary | ICD-10-CM | POA: Diagnosis not present

## 2024-04-28 DIAGNOSIS — N184 Chronic kidney disease, stage 4 (severe): Secondary | ICD-10-CM | POA: Diagnosis not present

## 2024-04-28 DIAGNOSIS — I130 Hypertensive heart and chronic kidney disease with heart failure and stage 1 through stage 4 chronic kidney disease, or unspecified chronic kidney disease: Secondary | ICD-10-CM | POA: Diagnosis not present

## 2024-04-28 DIAGNOSIS — E1122 Type 2 diabetes mellitus with diabetic chronic kidney disease: Secondary | ICD-10-CM | POA: Diagnosis not present

## 2024-04-28 LAB — MAGNESIUM: Magnesium: 1.6 mg/dL — ABNORMAL LOW (ref 1.7–2.4)

## 2024-04-28 MED ORDER — MAGNESIUM SULFATE 2 GM/50ML IV SOLN
2.0000 g | Freq: Once | INTRAVENOUS | Status: AC
  Administered 2024-04-28: 2 g via INTRAVENOUS
  Filled 2024-04-28: qty 50

## 2024-04-28 MED ORDER — SODIUM CHLORIDE 0.9 % IV SOLN
12.5000 mg | Freq: Once | INTRAVENOUS | Status: AC
  Administered 2024-04-28: 12.5 mg via INTRAVENOUS
  Filled 2024-04-28: qty 0.5

## 2024-04-28 MED ORDER — SODIUM CHLORIDE 0.9 % IV SOLN
INTRAVENOUS | Status: DC
  Filled 2024-04-28: qty 250

## 2024-04-29 ENCOUNTER — Encounter: Payer: Self-pay | Admitting: Oncology

## 2024-04-30 ENCOUNTER — Encounter: Payer: Self-pay | Admitting: Podiatry

## 2024-04-30 DIAGNOSIS — M9902 Segmental and somatic dysfunction of thoracic region: Secondary | ICD-10-CM | POA: Diagnosis not present

## 2024-04-30 DIAGNOSIS — M62830 Muscle spasm of back: Secondary | ICD-10-CM | POA: Diagnosis not present

## 2024-04-30 DIAGNOSIS — M9901 Segmental and somatic dysfunction of cervical region: Secondary | ICD-10-CM | POA: Diagnosis not present

## 2024-04-30 DIAGNOSIS — M5412 Radiculopathy, cervical region: Secondary | ICD-10-CM | POA: Diagnosis not present

## 2024-04-30 NOTE — Progress Notes (Signed)
  Subjective:  Patient ID: Sarah [name], female    DOB: 07-19-1955,  MRN: [id number]  Sarah [name] presents to clinic today for painful mycotic toenails x 10 which interfere with daily activities. Pain is relieved with periodic professional debridement.  Chief Complaint  Patient presents with   Toe Pain    Diabetic foot care . A1c is 6.4 Dr. Justus is her PCP. Last visit was in August.   New problem(s): None.   PCP is Justus Leita DEL, MD.  Allergies  Allergen Reactions   Other Anaphylaxis    Hycomine   Oxycodone-Acetaminophen  Anaphylaxis    Swelling and rash  acetaminophen  / oxycodone   Celebrex  [Celecoxib ] Diarrhea   Codeine     Plerixafor     In 2016 during ASCT collection patient developed fever to 103.67F and required hospitalization   Benadryl  [Diphenhydramine ] Palpitations   Morphine  Itching and Rash   Ondansetron  Diarrhea   Tylenol  [Acetaminophen ] Itching and Rash    Review of Systems: Negative except as noted in the HPI.  Objective: No changes noted in today's physical examination. There were no vitals filed for this visit. Sarah [name] is a pleasant 68 y.o. female WD, WN in NAD. AAO x 3.  Vascular Examination: Vascular status intact b/l with palpable pedal pulses. Pedal hair sparse. CFT immediate b/l. No edema. No pain with calf compression b/l. Skin temperature gradient WNL b/l.   Neurological Examination: Sensation grossly intact b/l with 10 gram monofilament. Vibratory sensation intact b/l.   Dermatological Examination: Pedal skin with normal turgor, texture and tone b/l. Toenails 1-5 b/l thick, discolored, elongated with subungual debris and pain on dorsal palpation. No hyperkeratotic lesions noted b/l.   Musculoskeletal Examination: Muscle strength 5/5 to b/l LE. No pain, crepitus or joint limitation noted with ROM bilateral LE. No gross bony deformities bilaterally.  Radiographs: None  Assessment/Plan: 1. Pain due to onychomycosis of  toenails of both feet   Patient was evaluated and treated. All patient's and/or POA's questions/concerns addressed on today's visit. Toenails 1-5 b/l debrided in length and girth without incident. Continue foot and shoe inspections daily. Monitor blood glucose per PCP/Endocrinologist's recommendations. Continue soft, supportive shoe gear daily. Report any pedal injuries to medical professional. Call office if there are any questions/concerns. -Patient/POA to call should there be question/concern in the interim.   Return in about 3 months (around 07/24/2024).  Delon LITTIE Merlin, DPM      [place] LOCATION: [address] [place] [address]                   Office [phone]   [facility] LOCATION: [address] [place], [place] [address] Office [phone]

## 2024-05-05 ENCOUNTER — Inpatient Hospital Stay

## 2024-05-05 MED FILL — Promethazine HCl Inj 25 MG/ML: INTRAMUSCULAR | Qty: 0.5 | Status: AC

## 2024-05-06 ENCOUNTER — Inpatient Hospital Stay: Attending: Oncology

## 2024-05-06 ENCOUNTER — Inpatient Hospital Stay

## 2024-05-06 VITALS — BP 137/63 | HR 54 | Temp 98.4°F | Resp 16

## 2024-05-06 DIAGNOSIS — Z9049 Acquired absence of other specified parts of digestive tract: Secondary | ICD-10-CM | POA: Diagnosis not present

## 2024-05-06 DIAGNOSIS — Z79899 Other long term (current) drug therapy: Secondary | ICD-10-CM | POA: Insufficient documentation

## 2024-05-06 DIAGNOSIS — C9001 Multiple myeloma in remission: Secondary | ICD-10-CM | POA: Insufficient documentation

## 2024-05-06 DIAGNOSIS — Z833 Family history of diabetes mellitus: Secondary | ICD-10-CM | POA: Diagnosis not present

## 2024-05-06 DIAGNOSIS — Z87891 Personal history of nicotine dependence: Secondary | ICD-10-CM | POA: Insufficient documentation

## 2024-05-06 DIAGNOSIS — Z885 Allergy status to narcotic agent status: Secondary | ICD-10-CM | POA: Insufficient documentation

## 2024-05-06 DIAGNOSIS — Z803 Family history of malignant neoplasm of breast: Secondary | ICD-10-CM | POA: Insufficient documentation

## 2024-05-06 DIAGNOSIS — I350 Nonrheumatic aortic (valve) stenosis: Secondary | ICD-10-CM | POA: Insufficient documentation

## 2024-05-06 DIAGNOSIS — Z8249 Family history of ischemic heart disease and other diseases of the circulatory system: Secondary | ICD-10-CM | POA: Insufficient documentation

## 2024-05-06 DIAGNOSIS — Z86018 Personal history of other benign neoplasm: Secondary | ICD-10-CM | POA: Insufficient documentation

## 2024-05-06 DIAGNOSIS — E1122 Type 2 diabetes mellitus with diabetic chronic kidney disease: Secondary | ICD-10-CM | POA: Diagnosis not present

## 2024-05-06 DIAGNOSIS — E538 Deficiency of other specified B group vitamins: Secondary | ICD-10-CM | POA: Insufficient documentation

## 2024-05-06 DIAGNOSIS — I130 Hypertensive heart and chronic kidney disease with heart failure and stage 1 through stage 4 chronic kidney disease, or unspecified chronic kidney disease: Secondary | ICD-10-CM | POA: Diagnosis not present

## 2024-05-06 DIAGNOSIS — Z90711 Acquired absence of uterus with remaining cervical stump: Secondary | ICD-10-CM | POA: Diagnosis not present

## 2024-05-06 DIAGNOSIS — I5032 Chronic diastolic (congestive) heart failure: Secondary | ICD-10-CM | POA: Diagnosis not present

## 2024-05-06 DIAGNOSIS — Z822 Family history of deafness and hearing loss: Secondary | ICD-10-CM | POA: Insufficient documentation

## 2024-05-06 DIAGNOSIS — Z800 Family history of malignant neoplasm of digestive organs: Secondary | ICD-10-CM | POA: Diagnosis not present

## 2024-05-06 DIAGNOSIS — I252 Old myocardial infarction: Secondary | ICD-10-CM | POA: Diagnosis not present

## 2024-05-06 DIAGNOSIS — Z801 Family history of malignant neoplasm of trachea, bronchus and lung: Secondary | ICD-10-CM | POA: Insufficient documentation

## 2024-05-06 DIAGNOSIS — Z9221 Personal history of antineoplastic chemotherapy: Secondary | ICD-10-CM | POA: Insufficient documentation

## 2024-05-06 DIAGNOSIS — Z886 Allergy status to analgesic agent status: Secondary | ICD-10-CM | POA: Diagnosis not present

## 2024-05-06 DIAGNOSIS — N184 Chronic kidney disease, stage 4 (severe): Secondary | ICD-10-CM | POA: Diagnosis not present

## 2024-05-06 DIAGNOSIS — Z8419 Family history of other disorders of kidney and ureter: Secondary | ICD-10-CM | POA: Diagnosis not present

## 2024-05-06 DIAGNOSIS — D801 Nonfamilial hypogammaglobulinemia: Secondary | ICD-10-CM | POA: Diagnosis not present

## 2024-05-06 DIAGNOSIS — I429 Cardiomyopathy, unspecified: Secondary | ICD-10-CM | POA: Diagnosis not present

## 2024-05-06 DIAGNOSIS — Z888 Allergy status to other drugs, medicaments and biological substances status: Secondary | ICD-10-CM | POA: Diagnosis not present

## 2024-05-06 DIAGNOSIS — Z808 Family history of malignant neoplasm of other organs or systems: Secondary | ICD-10-CM | POA: Insufficient documentation

## 2024-05-06 LAB — MAGNESIUM: Magnesium: 1.4 mg/dL — ABNORMAL LOW (ref 1.7–2.4)

## 2024-05-06 MED ORDER — SODIUM CHLORIDE 0.9 % IV SOLN
INTRAVENOUS | Status: DC
  Filled 2024-05-06: qty 250

## 2024-05-06 MED ORDER — SODIUM CHLORIDE 0.9 % IV SOLN
12.5000 mg | Freq: Once | INTRAVENOUS | Status: AC
  Administered 2024-05-06: 12.5 mg via INTRAVENOUS
  Filled 2024-05-06: qty 0.5

## 2024-05-06 MED ORDER — MAGNESIUM SULFATE 4 GM/100ML IV SOLN
4.0000 g | Freq: Once | INTRAVENOUS | Status: AC
  Administered 2024-05-06: 4 g via INTRAVENOUS
  Filled 2024-05-06: qty 100

## 2024-05-07 DIAGNOSIS — M5412 Radiculopathy, cervical region: Secondary | ICD-10-CM | POA: Diagnosis not present

## 2024-05-07 DIAGNOSIS — M62830 Muscle spasm of back: Secondary | ICD-10-CM | POA: Diagnosis not present

## 2024-05-07 DIAGNOSIS — M9902 Segmental and somatic dysfunction of thoracic region: Secondary | ICD-10-CM | POA: Diagnosis not present

## 2024-05-07 DIAGNOSIS — M9901 Segmental and somatic dysfunction of cervical region: Secondary | ICD-10-CM | POA: Diagnosis not present

## 2024-05-11 ENCOUNTER — Other Ambulatory Visit: Payer: Self-pay

## 2024-05-11 MED FILL — Promethazine HCl Inj 25 MG/ML: INTRAMUSCULAR | Qty: 0.5 | Status: AC

## 2024-05-12 ENCOUNTER — Inpatient Hospital Stay

## 2024-05-12 ENCOUNTER — Encounter: Payer: Self-pay | Admitting: Oncology

## 2024-05-12 ENCOUNTER — Inpatient Hospital Stay: Admitting: Oncology

## 2024-05-12 VITALS — BP 138/83 | HR 49 | Temp 96.1°F | Resp 18 | Wt 163.5 lb

## 2024-05-12 DIAGNOSIS — E538 Deficiency of other specified B group vitamins: Secondary | ICD-10-CM

## 2024-05-12 DIAGNOSIS — D500 Iron deficiency anemia secondary to blood loss (chronic): Secondary | ICD-10-CM | POA: Diagnosis not present

## 2024-05-12 DIAGNOSIS — C9001 Multiple myeloma in remission: Secondary | ICD-10-CM

## 2024-05-12 DIAGNOSIS — N184 Chronic kidney disease, stage 4 (severe): Secondary | ICD-10-CM

## 2024-05-12 LAB — CBC WITH DIFFERENTIAL (CANCER CENTER ONLY)
Abs Immature Granulocytes: 0.02 K/uL (ref 0.00–0.07)
Basophils Absolute: 0 K/uL (ref 0.0–0.1)
Basophils Relative: 0 %
Eosinophils Absolute: 0.1 K/uL (ref 0.0–0.5)
Eosinophils Relative: 1 %
HCT: 34.9 % — ABNORMAL LOW (ref 36.0–46.0)
Hemoglobin: 11.8 g/dL — ABNORMAL LOW (ref 12.0–15.0)
Immature Granulocytes: 0 %
Lymphocytes Relative: 20 %
Lymphs Abs: 1.1 K/uL (ref 0.7–4.0)
MCH: 28.4 pg (ref 26.0–34.0)
MCHC: 33.8 g/dL (ref 30.0–36.0)
MCV: 84.1 fL (ref 80.0–100.0)
Monocytes Absolute: 0.4 K/uL (ref 0.1–1.0)
Monocytes Relative: 8 %
Neutro Abs: 3.8 K/uL (ref 1.7–7.7)
Neutrophils Relative %: 71 %
Platelet Count: 103 K/uL — ABNORMAL LOW (ref 150–400)
RBC: 4.15 MIL/uL (ref 3.87–5.11)
RDW: 14 % (ref 11.5–15.5)
WBC Count: 5.4 K/uL (ref 4.0–10.5)
nRBC: 0 % (ref 0.0–0.2)

## 2024-05-12 LAB — CMP (CANCER CENTER ONLY)
ALT: 50 U/L — ABNORMAL HIGH (ref 0–44)
AST: 48 U/L — ABNORMAL HIGH (ref 15–41)
Albumin: 4.4 g/dL (ref 3.5–5.0)
Alkaline Phosphatase: 87 U/L (ref 38–126)
Anion gap: 10 (ref 5–15)
BUN: 29 mg/dL — ABNORMAL HIGH (ref 8–23)
CO2: 22 mmol/L (ref 22–32)
Calcium: 9.3 mg/dL (ref 8.9–10.3)
Chloride: 104 mmol/L (ref 98–111)
Creatinine: 1.36 mg/dL — ABNORMAL HIGH (ref 0.44–1.00)
GFR, Estimated: 43 mL/min — ABNORMAL LOW (ref >60)
Glucose, Bld: 116 mg/dL — ABNORMAL HIGH (ref 70–99)
Potassium: 4.5 mmol/L (ref 3.5–5.1)
Sodium: 136 mmol/L (ref 135–145)
Total Bilirubin: 0.4 mg/dL (ref 0.0–1.2)
Total Protein: 7.5 g/dL (ref 6.5–8.1)

## 2024-05-12 LAB — MAGNESIUM: Magnesium: 1.6 mg/dL — ABNORMAL LOW (ref 1.7–2.4)

## 2024-05-12 MED ORDER — SODIUM CHLORIDE 0.9 % IV SOLN
INTRAVENOUS | Status: DC
  Filled 2024-05-12: qty 250

## 2024-05-12 MED ORDER — CYANOCOBALAMIN 1000 MCG/ML IJ SOLN
1000.0000 ug | Freq: Once | INTRAMUSCULAR | Status: AC
  Administered 2024-05-12: 1000 ug via INTRAMUSCULAR
  Filled 2024-05-12: qty 1

## 2024-05-12 MED ORDER — SODIUM CHLORIDE 0.9 % IV SOLN
12.5000 mg | Freq: Once | INTRAVENOUS | Status: AC
  Administered 2024-05-12: 12.5 mg via INTRAVENOUS
  Filled 2024-05-12: qty 0.5

## 2024-05-12 MED ORDER — MAGNESIUM SULFATE 2 GM/50ML IV SOLN
2.0000 g | Freq: Once | INTRAVENOUS | Status: AC
  Administered 2024-05-12: 2 g via INTRAVENOUS
  Filled 2024-05-12: qty 50

## 2024-05-12 NOTE — Assessment & Plan Note (Signed)
 Lab Results  Component Value Date   HGB 11.8 (L) 05/12/2024   TIBC 368 01/07/2024   IRONPCTSAT 16 01/07/2024   FERRITIN 46 01/07/2024    Hb is stable

## 2024-05-12 NOTE — Assessment & Plan Note (Signed)
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  Light chain ratio is normal. 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 Repeat immunofixation negative. . Labs reviewed and discussed with patient, stable M protein and stable light chain ratio Off Daratumumab due to frequent infections  Continue acyclovir prophylaxis.

## 2024-05-12 NOTE — Assessment & Plan Note (Signed)
#  Chronic hypomagnesemia proceed with  IV magnesium today.  Continue weekly magnesium +/- IV magnesium.

## 2024-05-12 NOTE — Assessment & Plan Note (Signed)
-  avoid nephrotoxins

## 2024-05-12 NOTE — Progress Notes (Signed)
 Hematology/Oncology Progress note [phone] [phone]     Chief Complaint: Sarah [name] is a 68 y.o. 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  Light chain ratio is normal. 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 Repeat immunofixation negative. . Labs reviewed and discussed with patient, stable M protein and stable light chain ratio Off Daratumumab  due to frequent infections  Continue acyclovir  prophylaxis.  B12 deficiency continue B12 injections Q 6 weeks.   Hypomagnesemia #Chronic hypomagnesemia proceed with  IV magnesium  today.  Continue weekly magnesium  +/- IV magnesium .     Iron  deficiency anemia due to chronic blood loss Lab Results  Component Value Date   HGB 11.8 (L) 05/12/2024   TIBC 368 01/07/2024   IRONPCTSAT 16 01/07/2024   FERRITIN 46 01/07/2024    Hb is stable   Stage 4 chronic kidney disease (HCC) avoid nephrotoxins.      Orders Placed This Encounter  Procedures   CBC with Differential (Cancer Center Only)    Standing Status:   Future    Expected Date:   06/23/2024    Expiration Date:   09/21/2024   CMP (Cancer Center only)    Standing Status:   Future    Expected Date:   06/23/2024    Expiration Date:   09/21/2024   Multiple Myeloma Panel (SPEP&IFE w/QIG)    Standing Status:   Future    Expected Date:   06/23/2024    Expiration Date:   09/21/2024   Kappa/lambda light chains    Standing Status:   Future    Expected Date:   06/23/2024    Expiration Date:   09/21/2024    Follow up  Lab Mag weekly x5  +  IV mag.  6 weeks flex lab MD IV mag,   All questions were answered. The patient knows to call the clinic with any problems, questions or concerns.  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   Initial bone marrow revealed 80% plasma cells.  Lambda free light chains were 1340.  She had nephrotic range proteinuria.  She initially underwent induction with RVD.  Revlimid  maintenance was discontinued on 01/21/2017 secondary to intolerance.     07/19/2019 Bone marrow aspirate and biopsy on  revealed a normocellular marrow with but increased lambda-restricted plasma cells (9% aspirate, 40% CD138 immunohistochemistry).  Findings were consistent with recurrent plasma cell myeloma.  Flow cytometry revealed no monoclonal B-cell or phenotypically aberrant T-cell population. Cytogenetics were 28, XX (normal).  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    Lambda light chains have been followed: 22.2 (ratio 0.56)  on 07/03/2017, 30.8 (ratio 0.78) on 09/02/2017, 36.9 (ratio 0.40) on 10/21/2017, 37.4 (ratio 0.41) on 12/16/2017, 70.7(ratio 0.31)  on 02/17/2018, 64.2 (ratio 0.27) on 04/07/2018, 78.9 (ratio 0.18) on 05/26/2018, 128.8 (ratio 0.17) on 08/06/2018, 181.5 (ratio 0.13) on 10/08/2018, 130.9 (ratio 0.13) on 10/20/2018, 160.7 (ratio 0.10) on 12/09/2018, 236.6 (ratio 0.07) on 02/01/2019, 363.6 (ratio 0.04) on 03/22/2019, 404.8 (ratio 0.04) on 04/05/2019, 420.7 (ratio  0.03) on 05/24/2019, 573.4 (ratio 0.03) on 06/23/2019, 451.05 (ratio 0.02) on 08/20/2019, 47.2 (ratio 0.13) on 10/25/2019, 22.4 (ratio 0.21) on 11/25/2019, 16.5 (ratio 0.33) on 01/10/2020, 14.6 (ratio 0.34) on 02/07/2020, 13.1 (ratio 0.31) on 03/07/2020, 10.1 (ratio 0.38) on 04/10/2020, and 9.5 (ratio 0.21) on 06/19/2020.   24 hour UPEP on 06/03/2019 revealed kappa free light chains 95.76, lambda free light chains 1,260.71, and ratio 0.08.  24 hour UPEP on 08/23/2019 revealed total protein of 782 mg/24 hrs with lambda free light chains 1,084.16 mg/L and ratio of 0.10 (1.03-31.76).  M spike in urine was 46.1% (361 mg/24 hrs).    Bone survey on 04/08/2016 and 05/28/2017 revealed no definite lytic lesion seen in the visualized skeleton.  Bone survey on 11/19/2018 revealed no suspicious lucent lesions and no acute bony abnormality.  PET scan on 07/12/2019 revealed no focal metabolic activity to suggest active myeloma within the skeleton. There were no lytic lesions identified on the CT portion of the exam or soft tissue plasmacytomas. There was no evidence of multiple myeloma.    Pretreatment RBC phenotype on 09/23/2019 was positive for C, e, DUFFY B, KIDD B, M, S, and s antigen; negative for c, E, KELL, DUFFY A, KIDD A, and N antigen.    09/27/2019 - 10/25/2019; 12/09/2019 - 03/13/2020 6 cycles of daratumumab  and hyaluronidase -fihj, Pomalyst , and Decadron  (DPd) .  Cycle #1 was complicated by fever and neutropenia requiring admission.  Cycle #2 was complicated by pneumonia requiring admission.  Cycle #6 was complicated with an ER evaluation for an elevated lactic acid.   05/10/2020 second autologous stem cell transplant at [facility]   She underwent conditioning with melphalan 140 mg/m2.    She is s/p week #3 Velcade  maintenance (began 08/31/2020 - 09/28/2020).  She has no increase in neuropathy.  She has severe aortic stenosis.  Echo on 05/21/2020 revealed severe aortic valve stenosis (tricuspid valve with 2  leaflets fused) and an EF of 45-50%. Cardiology is following for a possible TAVR in the future.  Echo on 07/05/2020 revealed moderate to severe aortic stenosis with an EF of 50-55%.  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 Zometa  was discontinued in 01/2017.  She has chronic nausea on Phenergan .     B12 deficiency.  B12 was 254 on 04/09/2017, 295 on 08/20/2018, and 391 on 10/08/2018.  She was on oral B12.  She received B12 monthly (last 09/14/2020).  Folate was 12.1 on 01/10/2020.    She has iron  deficiency.  Ferritin was 32 on 07/01/2019.  She received Venofer  on 07/15/2019 and 07/22/2019.   She has hypogammaglobulinemia.  IgG was 245 on 11/25/2019.  She received monthly IVIG (07/22/021 - 03/22/2020).  She received IVIG 400 mg/kg on 01/20/2020, 200 mg/kg on 02/17/2020, and 300 mg/kg on 03/22/2020.  IVIG on 01/20/2020 was complicated by acute renal failure. IgG trough level was 418 on 02/17/2020.  10/15/2019 - 10/20/2019 She was admitted to [facility] with fever and neutropenia.  Cultures were negative.  CXR was negative. She received broad spectrum antibiotics and daily Granix .  She received IVF  for acute renal insufficiency due to diarrhea and dehydration.  Creatine was 1.66 on admission and 1.12 on discharge.    03/01/2020 - 03/05/2020 admitted to [facility] from with fever and neutropenia. CXR revealed no active cardiopulmonary disease. Chest CT with contrast revealed no acute intrathoracic pathology. There were findings which could be suggestive of prior granulomatous disease. She was treated with Cefepime  and Vancomycin , then switched to ciprofloxacin  on 03/03/2020.   05/08/2020 - 12/05/2021The patient was admitted to [facility] from  for autologous bone marrow transplant.   She received conditioning with melphalan 140 mg/m2.  Bone marrow transplant was on 05/10/2020. The patient developed fevers daily from 05/19/2020 - 05/24/2020. Blood cultures were + for strept sanguis bacteremia.  She  was treated cefepime , vancomycin , and solumedrol for possible engraftment syndrome. Cefepime  was switched to ceftriaxone ; she completed 2 weeks of ceftriaxone  on 06/03/2020. She had chemotherapy induced diarrhea.  She experienced urinary retention.  Feb 2022 patient has been on maintenance Velcade  2 mg every 2 weeks.  Chronic diarrhea seen by gastroenterology Dr. Therisa and was recommended to avoid artificial sugars in her diet including Diet Coke and coffee mate.  She feels some improvement of her diarrhea frequency since the dietary modification.  GI profile PCR negative, C. difficile toxin A+ B negative, fecal calprotectin 77 12/19/2020 colonoscopy showed 2 subcentimeter polyps in the ascending colon, resected and removed.  Pathology showed tubular adenoma.  No malignancy.  Normal mucosa in the entire examined colon.  Biopsied, pathology showed colonic mucosa with no significant pathological alteration.  Negative for active inflammation and features of chronicity.  Negative for microscopic colitis, dysplasia, and malignanc  Diabetes, patient has discontinued metformin  due to diarrhea and started on glipizide  2.5 mg   03/05/2021-03/09/2021 Patient was hospitalized due to fever and chills, nonproductive cough, shortness of breath. X-ray showed right lower lobe atelectasis versus pneumonia.  Blood cultures negative.  Urine culture showed 60,000 colonies of Enterococcus facialis.  Patient received antibiotics. Patient also had abdominal pelvis CT scan for work-up of intermittent lower abdomen pain.No acute abnormality.   05/14/21 last dose of Velcade  maintenance.  establish care with neurology Dr. Lane Hussar and her neuropathy regimen has been switched from gabapentin  to Lyrica . 05/29/2021 patient got influenza   neuropathy medication has been switched from gabapentin /Lyrica  to nortriptyline.  08/18/2021 Hospitalized due to pneumonia from metapnuemovirus, hospitalization was complicated with  NSTEMI. She received heparin  gtt.  Treated with antibiotics for coverage of pneumonia,  diuretics for pulmonary edema. She received IVIG 400mg /kg x 1 for hypoglobulinemia. Discharged on 08/25/21 with Augmentin  and Zithromax .   May 2023, Daratumumab  being held for [facility] infectious disease physician Dr. Lorrene due to frequent infection.  Diagnosed with CHF [HFrEF] -NYHA class II-III, she follows up with cardiology, started on entresto   hospitalization due to CHF exacerbation, community acquired pneumonia.   Presented to emergency room on 07/01/2022 Respiratory panel RT-PCR negative for COVID-19, influenza and RSV.  + CHF [nonischemic cardiomyopathy], aortic stenosis, follows up with cardiology. 11/06/2023 s/p TAVR for aortic valve stenosis.  INTERVAL HISTORY Sarah [name] is a 68 y.o. female who has above history reviewed by me today presents for follow up visit for management of multiple myeloma.  Patient has been on weekly magnesium  infusion as needed hypomagnesemia Upper respiratory infection symptoms are resolved. No fever or chills.    Past Medical History:  Diagnosis Date   Anemia    Anxiety    Aortic stenosis    a. 05/2020 Echo: EF 45-50%, sev AS - seen  by TAVR team @ [facility] - CTA sugg of tricuspid valve w/ fusing of 2 leaflets-TAVR deferred in setting of acute infxn; b. 07/2020 Echo: EF 50-55%, mod-sev AS; c. 12/2020 Echo: EF>55%. Mod-sev paradoxical low-flow low-gradient AS; d. 08/2021 Echo: EF 35-40%, severe AS, triv AI, mild MR.   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 Variable EF over time; b. 08/2017 Echo: EF 40%; b. 03/2020 Echo: EF 55-60%; c. 05/2020 Echo: EF 45-50%; d. 07/2020 Echo: EF 50-55%; e. 12/2020 Echo: EF>55%; f. 08/2021 Echo: EF 35-40%.   CHF (congestive heart failure) (HCC)    Chronic heart failure with preserved  ejection fraction (HFpEF) (HCC)    a. Variable EF over time; b. 08/2017 Echo: EF 40%; b. 03/2020 Echo: EF 55-60%; c. 05/2020 Echo: EF 45-50%; d. 07/2020 Echo: EF 50-55%; e. 12/2020 Echo: EF>55%; f. 08/2021 Echo: EF 35-40%, no RV, mildly dil LA, mild MR, triv AI, severe AS.   Chronic pain syndrome 07/08/2016   CKD (chronic kidney disease) stage 3, GFR 30-59 ml/min (HCC)    Colon cancer (HCC)    Dad   Depression    Diabetes mellitus (HCC)    Dizziness    Fatty liver    Frequent falls    GERD (gastroesophageal reflux disease)    Gout    Heart murmur    History of blood transfusion    History of bone marrow transplant (HCC)    History of uterine fibroid    Hx of cardiac catheterization    a. 01/2016 Cath ([place]  - after abnl nuc): Nl cors.   Hypertension    Hypomagnesemia    IDA (iron  deficiency anemia)    Multiple myeloma (HCC)    NSTEMI (non-ST elevated myocardial infarction) (HCC) 08/21/2021   Personal history of chemotherapy    Pleurisy 04/09/2022   PSVT (paroxysmal supraventricular tachycardia)    a. S/p RFCA 1999 ([place] ).   PVC's (premature ventricular contractions)    a. Well-managed w/ bisoprolol  in outpt setting.   Renal cyst    Ulcer 2008   3 ulcers    Past Surgical History:  Procedure Laterality Date   ABDOMINAL HYSTERECTOMY     Auto Stem Cell transplant  06/2015   CARDIAC ELECTROPHYSIOLOGY MAPPING AND ABLATION     CARPAL TUNNEL RELEASE Bilateral    CHOLECYSTECTOMY  2008   COLONOSCOPY WITH PROPOFOL  N/A 05/07/2017   Procedure: COLONOSCOPY WITH PROPOFOL ;  Surgeon: Therisa Bi, MD;  Location: [facility] ENDOSCOPY;  Service: Gastroenterology;  Laterality: N/A;   COLONOSCOPY WITH PROPOFOL  N/A 12/19/2020   Procedure: COLONOSCOPY WITH PROPOFOL ;  Surgeon: Therisa Bi, MD;  Location: [facility] ENDOSCOPY;  Service: Gastroenterology;  Laterality: N/A;   ESOPHAGOGASTRODUODENOSCOPY (EGD) WITH PROPOFOL  N/A 05/07/2017   Procedure: ESOPHAGOGASTRODUODENOSCOPY (EGD) WITH PROPOFOL ;  Surgeon:  Therisa Bi, MD;  Location: [facility] ENDOSCOPY;  Service: Gastroenterology;  Laterality: N/A;   FOOT SURGERY Bilateral    INCONTINENCE SURGERY  2009   INTERSTIM IMPLANT PLACEMENT     other     over active bladder   OTHER SURGICAL HISTORY     bladder stimulator    PARTIAL HYSTERECTOMY  03/1996   fibroids   PORTA CATH INSERTION N/A 03/10/2019   Procedure: PORTA CATH INSERTION;  Surgeon: Marea Selinda RAMAN, MD;  Location: [facility] INVASIVE CV LAB;  Service: Cardiovascular;  Laterality: N/A;  TONSILLECTOMY  2007   TRANSCATHETER AORTIC VALVE REPLACEMENT, TRANSFEMORAL  11/06/2023   TUBAL LIGATION  1984    Family History  Problem Relation Age of Onset   Colon cancer Father    Renal Disease Father    Diabetes Mellitus II Father    Heart disease Father    Kidney disease Father    Melanoma Paternal Grandmother    Breast cancer Maternal Aunt 37   Anemia Mother    Heart disease Mother    Heart failure Mother    Renal Disease Mother    Congestive Heart Failure Mother    Kidney disease Mother    Heart disease Maternal Uncle    Throat cancer Maternal Uncle    Lung cancer Maternal Uncle    Liver disease Maternal Uncle    Heart failure Maternal Uncle    Hearing loss Son 57       Suicide    Heart disease Son    Hearing loss Sister    Heart disease Son     Social History:  reports that she quit smoking about 32 years ago. Her smoking use included cigarettes. She started smoking about 52 years ago. She has a 20 pack-year smoking history. She has never used smokeless tobacco. She reports that she does not currently use alcohol after a past usage of about 4.0 standard drinks of alcohol per week. She reports that she does not use drugs.  She is on disability. She notes exposure to perchloroethylene [facility]).   Allergies:  Allergies  Allergen Reactions   Other Anaphylaxis    Hycomine   Oxycodone-Acetaminophen  Anaphylaxis    Swelling and rash  acetaminophen  / oxycodone   Celebrex  [Celecoxib ] Diarrhea    Codeine     Plerixafor     In 2016 during ASCT collection patient developed fever to 103.57F and required hospitalization   Benadryl  [Diphenhydramine ] Palpitations   Morphine  Itching and Rash   Ondansetron  Diarrhea   Tylenol  [Acetaminophen ] Itching and Rash    Current Medications: Current Outpatient Medications  Medication Sig Dispense Refill   albuterol  (VENTOLIN  HFA) 108 (90 Base) MCG/ACT inhaler Inhale 2 puffs into the lungs every 6 (six) hours as needed for wheezing or shortness of breath. 17 each 0   allopurinol  (ZYLOPRIM ) 100 MG tablet TAKE 1 TABLET(100 MG) BY MOUTH DAILY as needed 90 tablet 1   amiodarone (PACERONE) 200 MG tablet Take 200 mg by mouth daily.     benzonatate  (TESSALON ) 100 MG capsule Take 1 capsule (100 mg total) by mouth 3 (three) times daily as needed for cough. 20 capsule 0   bisoprolol  (ZEBETA ) 10 MG tablet Take 1 tablet (10 mg total) by mouth daily. 90 tablet 1   celecoxib  (CELEBREX ) 200 MG capsule Take 200 mg by mouth daily.     cholestyramine  (QUESTRAN ) 4 GM/DOSE powder Take 1 packet (4 g total) by mouth 2 (two) times daily with a meal. 240 packet 2   clotrimazole  (GYNE-LOTRIMIN  3) 2 % vaginal cream Place 1 Applicatorful vaginally at bedtime. 21 g 0   diazepam  (VALIUM ) 5 MG tablet TAKE 1/2 TO 1 TABLET(2.5 TO 5 MG) BY MOUTH AT BEDTIME AS NEEDED FOR INSOMNIA 30 tablet 5   diclofenac  sodium (VOLTAREN ) 1 % GEL Apply 2 g topically 4 (four) times daily. 100 g 1   diphenoxylate -atropine  (LOMOTIL ) 2.5-0.025 MG tablet Take 2 tablets by mouth 4 (four) times daily as needed for diarrhea or loose stools. 180 tablet 3   ENTRESTO  24-26 MG Take 1 tablet by  mouth 2 (two) times daily.     FLUoxetine  (PROZAC ) 40 MG capsule Take 1 capsule (40 mg total) by mouth daily. 90 capsule 1   fluticasone  (FLONASE ) 50 MCG/ACT nasal spray Place 2 sprays into both nostrils daily.     furosemide  (LASIX ) 40 MG tablet Take 0.5 tablets (20 mg total) by mouth daily. Home dose.     glipiZIDE   (GLUCOTROL  XL) 2.5 MG 24 hr tablet TAKE 1 TABLET(2.5 MG) BY MOUTH DAILY WITH BREAKFAST 90 tablet 1   glucose blood test strip      JARDIANCE 10 MG TABS tablet Take 10 mg by mouth daily.     lidocaine  (XYLOCAINE ) 2 % solution Use as directed 15 mLs in the mouth or throat every 3 (three) hours as needed for mouth pain (swish and spit). 100 mL 0   metaxalone  (SKELAXIN ) 800 MG tablet Take 1 tablet (800 mg total) by mouth in the morning. 30 tablet 0   Multiple Vitamin (MULTIVITAMIN) tablet Take 1 tablet by mouth daily.     omeprazole  (PRILOSEC) 40 MG capsule TAKE 1 CAPSULE IN THE MORNING AND TAKE 1 CAPSULE AT BEDTIME 180 capsule 3   potassium chloride  SA (KLOR-CON  M) 20 MEQ tablet Take 1 tablet (20 mEq total) by mouth daily. 7 tablet 0   rosuvastatin  (CRESTOR ) 5 MG tablet Take 1 tablet (5 mg total) by mouth daily. 90 tablet 1   spironolactone (ALDACTONE) 25 MG tablet Take by mouth.     tiZANidine  (ZANAFLEX ) 4 MG tablet Take 1 tablet (4 mg total) by mouth at bedtime. 90 tablet 0   traZODone  (DESYREL ) 100 MG tablet TAKE 1 TABLET AT BEDTIME 90 tablet 3   VITAMIN D , CHOLECALCIFEROL, PO Take 2,000 Units by mouth daily.     No current facility-administered medications for this visit.   Facility-Administered Medications Ordered in Other Visits  Medication Dose Route Frequency Provider Last Rate Last Admin   0.9 %  sodium chloride  infusion   Intravenous Continuous Babara Call, MD   Stopped at 05/12/24 1617   sodium chloride  flush (NS) 0.9 % injection 10 mL  10 mL Intravenous PRN Babara Call, MD   10 mL at 04/02/21 0904   sodium chloride  flush (NS) 0.9 % injection 10 mL  10 mL Intracatheter Once PRN Babara Call, MD        Review of Systems  Constitutional:  Negative for chills, fever, malaise/fatigue and weight loss.  HENT:  Negative for congestion and sore throat.   Eyes:  Negative for redness.  Respiratory:  Negative for cough, shortness of breath and wheezing.   Cardiovascular:  Negative for chest pain,  palpitations and leg swelling.  Gastrointestinal:  Positive for diarrhea. Negative for abdominal pain, blood in stool, nausea and vomiting.  Genitourinary:  Negative for dysuria.  Musculoskeletal:  Positive for back pain and joint pain. Negative for myalgias.  Skin:  Negative for rash.  Neurological:  Positive for tingling and sensory change. Negative for dizziness and tremors.  Endo/Heme/Allergies:  Does not bruise/bleed easily.  Psychiatric/Behavioral:  Negative for hallucinations.     Performance status (ECOG): 1  Vitals Blood pressure 138/83, pulse (!) 49, temperature (!) 96.1 F (35.6 C), temperature source Tympanic, resp. rate 18, weight 163 lb 8 oz (74.2 kg), SpO2 99%.  Physical Exam Constitutional:      General: She is not in acute distress.    Appearance: She is not diaphoretic.  HENT:     Head: Normocephalic and atraumatic.  Eyes:  General: No scleral icterus. Cardiovascular:     Rate and Rhythm: Normal rate.     Heart sounds: Murmur heard.  Pulmonary:     Effort: Pulmonary effort is normal. No respiratory distress.     Breath sounds: Normal breath sounds. No wheezing.  Abdominal:     General: There is no distension.  Musculoskeletal:        General: Normal range of motion.     Cervical back: Normal range of motion and neck supple.     Comments: Bilateral SI joint tenderness.   Skin:    General: Skin is warm and dry.     Findings: No erythema.  Neurological:     Mental Status: She is alert and oriented to person, place, and time. Mental status is at baseline.     Motor: No abnormal muscle tone.  Psychiatric:        Mood and Affect: Mood and affect normal.      Laboratory data    Latest Ref Rng & Units 05/12/2024    1:25 PM 03/31/2024    9:31 AM 03/24/2024    9:41 AM  CBC  WBC 4.0 - 10.5 K/uL 5.4  5.2  9.3   Hemoglobin 12.0 - 15.0 g/dL 88.1  88.2  87.6   Hematocrit 36.0 - 46.0 % 34.9  34.9  36.5   Platelets 150 - 400 K/uL 103  117  106       Latest  Ref Rng & Units 05/12/2024    1:25 PM 03/31/2024    9:31 AM 02/18/2024    9:35 AM  CMP  Glucose 70 - 99 mg/dL 883  880  883   BUN 8 - 23 mg/dL 29  28  34   Creatinine 0.44 - 1.00 mg/dL 8.63  8.46  8.50   Sodium 135 - 145 mmol/L 136  138  136   Potassium 3.5 - 5.1 mmol/L 4.5  3.8  3.9   Chloride 98 - 111 mmol/L 104  110  105   CO2 22 - 32 mmol/L 22  19  22    Calcium  8.9 - 10.3 mg/dL 9.3  8.8  9.0   Total Protein 6.5 - 8.1 g/dL 7.5  7.0  7.1   Total Bilirubin 0.0 - 1.2 mg/dL 0.4  0.7  0.4   Alkaline Phos 38 - 126 U/L 87  98  81   AST 15 - 41 U/L 48  27  31   ALT 0 - 44 U/L 50  31  33

## 2024-05-12 NOTE — Assessment & Plan Note (Signed)
 continue B12 injections Q 6 weeks.

## 2024-05-13 ENCOUNTER — Encounter: Payer: Self-pay | Admitting: Hospice and Palliative Medicine

## 2024-05-13 LAB — KAPPA/LAMBDA LIGHT CHAINS
Kappa free light chain: 21.9 mg/L — ABNORMAL HIGH (ref 3.3–19.4)
Kappa, lambda light chain ratio: 0.7 (ref 0.26–1.65)
Lambda free light chains: 31.3 mg/L — ABNORMAL HIGH (ref 5.7–26.3)

## 2024-05-14 DIAGNOSIS — M5412 Radiculopathy, cervical region: Secondary | ICD-10-CM | POA: Diagnosis not present

## 2024-05-14 DIAGNOSIS — M9902 Segmental and somatic dysfunction of thoracic region: Secondary | ICD-10-CM | POA: Diagnosis not present

## 2024-05-14 DIAGNOSIS — M9901 Segmental and somatic dysfunction of cervical region: Secondary | ICD-10-CM | POA: Diagnosis not present

## 2024-05-14 DIAGNOSIS — M62830 Muscle spasm of back: Secondary | ICD-10-CM | POA: Diagnosis not present

## 2024-05-15 LAB — MULTIPLE MYELOMA PANEL, SERUM
Albumin SerPl Elph-Mcnc: 4.1 g/dL (ref 2.9–4.4)
Albumin/Glob SerPl: 1.4 (ref 0.7–1.7)
Alpha 1: 0.3 g/dL (ref 0.0–0.4)
Alpha2 Glob SerPl Elph-Mcnc: 0.9 g/dL (ref 0.4–1.0)
B-Globulin SerPl Elph-Mcnc: 1.2 g/dL (ref 0.7–1.3)
Gamma Glob SerPl Elph-Mcnc: 0.6 g/dL (ref 0.4–1.8)
Globulin, Total: 3 g/dL (ref 2.2–3.9)
IgA: 295 mg/dL (ref 87–352)
IgG (Immunoglobin G), Serum: 703 mg/dL (ref 586–1602)
IgM (Immunoglobulin M), Srm: 29 mg/dL (ref 26–217)
Total Protein ELP: 7.1 g/dL (ref 6.0–8.5)

## 2024-05-18 ENCOUNTER — Other Ambulatory Visit: Payer: Self-pay | Admitting: Medical Genetics

## 2024-05-19 ENCOUNTER — Inpatient Hospital Stay

## 2024-05-19 ENCOUNTER — Other Ambulatory Visit
Admission: RE | Admit: 2024-05-19 | Discharge: 2024-05-19 | Disposition: A | Discharge status: Home / Self Care | Payer: Self-pay | Source: Ambulatory Visit | Attending: Medical Genetics | Admitting: Medical Genetics

## 2024-05-19 VITALS — BP 136/67 | HR 49 | Temp 97.4°F | Resp 18

## 2024-05-19 DIAGNOSIS — C9001 Multiple myeloma in remission: Secondary | ICD-10-CM

## 2024-05-19 LAB — MAGNESIUM: Magnesium: 1.4 mg/dL — ABNORMAL LOW (ref 1.7–2.4)

## 2024-05-19 MED ORDER — SODIUM CHLORIDE 0.9 % IV SOLN
INTRAVENOUS | Status: DC
  Filled 2024-05-19: qty 250

## 2024-05-19 MED ORDER — SODIUM CHLORIDE 0.9 % IV SOLN
12.5000 mg | Freq: Once | INTRAVENOUS | Status: AC
  Administered 2024-05-19: 12.5 mg via INTRAVENOUS
  Filled 2024-05-19: qty 0.5

## 2024-05-19 MED ORDER — MAGNESIUM SULFATE 4 GM/100ML IV SOLN
4.0000 g | Freq: Once | INTRAVENOUS | Status: AC
  Administered 2024-05-19: 4 g via INTRAVENOUS
  Filled 2024-05-19: qty 100

## 2024-05-21 ENCOUNTER — Encounter: Attending: Internal Medicine | Admitting: Dietician

## 2024-05-21 ENCOUNTER — Encounter: Payer: Self-pay | Admitting: Dietician

## 2024-05-21 VITALS — Ht 62.5 in | Wt 162.3 lb

## 2024-05-21 DIAGNOSIS — M5412 Radiculopathy, cervical region: Secondary | ICD-10-CM | POA: Diagnosis not present

## 2024-05-21 DIAGNOSIS — H0012 Chalazion right lower eyelid: Secondary | ICD-10-CM | POA: Diagnosis not present

## 2024-05-21 DIAGNOSIS — M9901 Segmental and somatic dysfunction of cervical region: Secondary | ICD-10-CM | POA: Diagnosis not present

## 2024-05-21 DIAGNOSIS — M9902 Segmental and somatic dysfunction of thoracic region: Secondary | ICD-10-CM | POA: Diagnosis not present

## 2024-05-21 DIAGNOSIS — E119 Type 2 diabetes mellitus without complications: Secondary | ICD-10-CM | POA: Diagnosis present

## 2024-05-21 DIAGNOSIS — Z713 Dietary counseling and surveillance: Secondary | ICD-10-CM | POA: Diagnosis not present

## 2024-05-21 DIAGNOSIS — G453 Amaurosis fugax: Secondary | ICD-10-CM | POA: Diagnosis not present

## 2024-05-21 DIAGNOSIS — M62830 Muscle spasm of back: Secondary | ICD-10-CM | POA: Diagnosis not present

## 2024-05-21 DIAGNOSIS — Z7984 Long term (current) use of oral hypoglycemic drugs: Secondary | ICD-10-CM | POA: Diagnosis present

## 2024-05-21 DIAGNOSIS — H532 Diplopia: Secondary | ICD-10-CM | POA: Diagnosis not present

## 2024-05-21 NOTE — Progress Notes (Signed)
 Diabetes Self-Management Education  Visit Type: First/Initial  Appt. Start Time: 1020 Appt. End Time: 1130  05/21/2024  [name], identified by name and date of birth, is a 68 y.o. female with a diagnosis of Diabetes: Type 2.   ASSESSMENT Height 5' 2.5 (1.588 m), weight 162 lb 4.8 oz (73.6 kg). Body mass index is 29.21 kg/m.  Pt reports taking Glipizide  @2 .5 mg daily, and Jardiance @10mg  daily for DM. Pt reports dealing with chronic fatigue, afternoon dizziness on a regular basis, recurrent nausea managed with promethazine . Pt states they are unsure if dizziness is related to hypoglycemia, does not have a glucometer. 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 Pt reports being on a soft food diet currently due to jaw bone exposure/open sores, will be seeing a designer, industrial/product in January. Pt reports forcing themselves to eat early in the day sometimes most days, reports low appetite in the morning most days.   Diabetes Self-Management Education - 05/18/24 1348       Visit Information   Visit Type First/Initial      Initial Visit   Diabetes Type Type 2      Psychosocial Assessment   Patient Belief/Attitude about Diabetes Motivated to manage diabetes    What is the hardest part about your diabetes [name] now, causing you the most concern, or is the most worrisome to you about your diabetes?   Checking blood sugar    Self-care barriers None;Debilitated state due to current medical condition   Multiple myeloma   Self-management support Doctor's office    Other persons present Patient    Patient Concerns Glycemic Control;Healthy Lifestyle    Special Needs None    Preferred Learning Style No preference indicated    Learning Readiness Ready    How often do you need to have someone help you when you read instructions, pamphlets, or  other written materials from your doctor or pharmacy? 1 - Never      Pre-Education Assessment   Patient understands the diabetes disease and treatment process. Needs Instruction    Patient understands incorporating nutritional management into lifestyle. Needs Instruction    Patient undertands incorporating physical activity into lifestyle. Needs Instruction    Patient understands using medications safely. Needs Instruction    Patient understands monitoring blood glucose, interpreting and using results Needs Instruction    Patient understands prevention, detection, and treatment of acute complications. Needs Instruction    Patient understands prevention, detection, and treatment of chronic complications. Needs Instruction    Patient understands how to develop strategies to address psychosocial issues. Needs Instruction    Patient understands how to develop strategies to promote health/change behavior. Needs Instruction      Complications   Last HgB A1C per patient/outside source 6.4 %   01/23/2024   How often do you check your blood sugar? 0 times/day (not testing)      Dietary Intake   Breakfast 2 scrambled eggs and cheese, 2 cups of coffee w/1 cube sugar, creamer    Dinner Fried Chicken, fried okra, boiled potatoes w/ butter    Snack (evening) PB and Jelly sandwich, white bread    Beverage(s) Coffee, Lightly sweet tea w/ tea, water      Activity / Exercise   Activity / Exercise Type ADL's      Patient Education   Disease Pathophysiology Factors that contribute to  the development of diabetes;Explored patient's options for treatment of their diabetes    Healthy Eating Role of diet in the treatment of diabetes and the relationship between the three main macronutrients and blood glucose level;Meal options for control of blood glucose level and chronic complications.;Meal timing in regards to the patients' current diabetes medication.    Being Active Identified with patient nutritional and/or  medication changes necessary with exercise.    Medications Reviewed patients medication for diabetes, action, purpose, timing of dose and side effects.    Monitoring Identified appropriate SMBG and/or A1C goals.;Purpose and frequency of SMBG.   Check for hypoglycemia   Acute complications Taught prevention, symptoms, and  treatment of hypoglycemia - the 15 rule.    Chronic complications Relationship between chronic complications and blood glucose control;Identified and discussed with patient  current chronic complications   Multiple myeloma   Diabetes Stress and Support Identified and addressed patients feelings and concerns about diabetes      Individualized Goals (developed by patient)   Nutrition Follow meal plan discussed    Physical Activity Not Applicable    Medications take my medication as prescribed    Monitoring  Test my blood glucose as discussed    Problem Solving Eating Pattern    Reducing Risk examine blood glucose patterns;treat hypoglycemia with 15 grams of carbs if blood glucose less than 70mg /dL      Post-Education Assessment   Patient understands the diabetes disease and treatment process. Comprehends key points    Patient understands incorporating nutritional management into lifestyle. Comprehends key points    Patient undertands incorporating physical activity into lifestyle. Needs Review    Patient understands using medications safely. Comphrehends key points    Patient understands monitoring blood glucose, interpreting and using results Needs Review    Patient understands prevention, detection, and treatment of acute complications. Needs Review    Patient understands prevention, detection, and treatment of chronic complications. Comprehends key points    Patient understands how to develop strategies to address psychosocial issues. Comprehends key points    Patient understands how to develop strategies to promote health/change behavior. Comprehends key points       Outcomes   Expected Outcomes Demonstrated interest in learning. Expect positive outcomes    Future DMSE 2 months    Program Status Not Completed          Individualized Plan for Diabetes Self-Management Training:   Learning Objective:  Patient will have a greater understanding of diabetes self-management. Patient education plan is to attend individual and/or group sessions per assessed needs and concerns.   Plan:   Patient Instructions  Contact your PCP and request a prescription for a new glucometer!  If you get a bout of dizziness, check your blood sugar! If it below 70 mg/dL, follow the Rule of 15 found in your ADA book to treat low blood sugar!  Add in a small handful of grapes, half a banana, or a small orange to your breakfast for some carbs to start your day.  Add in a high calorie, high protein snack in the afternoon between your breakfast and dinner. Have a PB and J or spoonful of PB, have a cup of Skyr.  Begin to build your meals using the proportions of the Balanced Plate. First, select your carb choice(s) for the meal. Make this no more than 25% of your meal. Next, select your source of protein to pair with your carb choice(s). Make this another 25-40% of your meal. Finally, complete  your meal with a variety of non-starchy vegetables. Make this the remaining 50% of your meal.   Expected Outcomes:  Demonstrated interest in learning. Expect positive outcomes  Education material provided: ADA - How to Thrive: A Guide for Your Journey with Diabetes  If problems or questions, patient to contact team via:  Phone and Email  Future DSME appointment: 2 months

## 2024-05-21 NOTE — Patient Instructions (Addendum)
 Contact your PCP and request a prescription for a new glucometer!  If you get a bout of dizziness, check your blood sugar! If it below 70 mg/dL, follow the Rule of 15 found in your ADA book to treat low blood sugar!  Add in a small handful of grapes, half a banana, or a small orange to your breakfast for some carbs to start your day.  Add in a high calorie, high protein snack in the afternoon between your breakfast and dinner. Have a PB and J or spoonful of PB, have a cup of Skyr.  Begin to build your meals using the proportions of the Balanced Plate. First, select your carb choice(s) for the meal. Make this no more than 25% of your meal. Next, select your source of protein to pair with your carb choice(s). Make this another 25-40% of your meal. Finally, complete your meal with a variety of non-starchy vegetables. Make this the remaining 50% of your meal.

## 2024-05-24 ENCOUNTER — Encounter: Payer: Self-pay | Admitting: Internal Medicine

## 2024-05-24 ENCOUNTER — Other Ambulatory Visit: Payer: Self-pay

## 2024-05-24 MED ORDER — BLOOD GLUCOSE MONITORING SUPPL DEVI: 1.0000 | 0 refills | Status: AC

## 2024-05-24 MED ORDER — BLOOD GLUCOSE TEST VI STRP: 1.0000 | ORAL_STRIP | 0 refills | Status: AC

## 2024-05-24 MED ORDER — LANCET DEVICE MISC: 1.0000 | 0 refills | Status: AC

## 2024-05-24 MED ORDER — LANCETS MISC: 1.0000 | 0 refills | Status: AC

## 2024-05-25 ENCOUNTER — Ambulatory Visit: Admitting: Internal Medicine

## 2024-05-25 ENCOUNTER — Encounter: Payer: Self-pay | Admitting: Internal Medicine

## 2024-05-25 VITALS — BP 128/78 | HR 70 | Ht 62.5 in | Wt 164.0 lb

## 2024-05-25 DIAGNOSIS — J4520 Mild intermittent asthma, uncomplicated: Secondary | ICD-10-CM | POA: Diagnosis not present

## 2024-05-25 DIAGNOSIS — I10 Essential (primary) hypertension: Secondary | ICD-10-CM | POA: Diagnosis not present

## 2024-05-25 DIAGNOSIS — E118 Type 2 diabetes mellitus with unspecified complications: Secondary | ICD-10-CM | POA: Diagnosis not present

## 2024-05-25 DIAGNOSIS — Z7984 Long term (current) use of oral hypoglycemic drugs: Secondary | ICD-10-CM | POA: Diagnosis not present

## 2024-05-25 DIAGNOSIS — N2581 Secondary hyperparathyroidism of renal origin: Secondary | ICD-10-CM | POA: Diagnosis not present

## 2024-05-25 DIAGNOSIS — D631 Anemia in chronic kidney disease: Secondary | ICD-10-CM | POA: Diagnosis not present

## 2024-05-25 DIAGNOSIS — C9000 Multiple myeloma not having achieved remission: Secondary | ICD-10-CM | POA: Diagnosis not present

## 2024-05-25 DIAGNOSIS — I5022 Chronic systolic (congestive) heart failure: Secondary | ICD-10-CM

## 2024-05-25 DIAGNOSIS — E1122 Type 2 diabetes mellitus with diabetic chronic kidney disease: Secondary | ICD-10-CM | POA: Diagnosis not present

## 2024-05-25 LAB — POCT GLYCOSYLATED HEMOGLOBIN (HGB A1C): Hemoglobin A1C: 6.6 % — AB (ref 4.0–5.6)

## 2024-05-25 NOTE — Assessment & Plan Note (Signed)
 Uses albuterol  prn only. Last filled in October by oncology.

## 2024-05-25 NOTE — Assessment & Plan Note (Addendum)
 Followed by Cardiology On Entresto  and Jardiance.  EF has been stable

## 2024-05-25 NOTE — Assessment & Plan Note (Addendum)
 Currently medications are glipzide and Jardiance.  No hypoglycemic episodes noted. Last visit medical regimen changes were none. Lab Results  Component Value Date   HGBA1C 6.4 (H) 01/23/2024  A1C today = 6.6

## 2024-05-25 NOTE — Progress Notes (Signed)
 Date:  05/25/2024   Name:  Sarah [name]   DOB:  1955/08/04   MRN:  [id number]   Chief Complaint: Diabetes  Diabetes She presents for her follow-up diabetic visit. She has type 2 diabetes mellitus. Pertinent negatives for hypoglycemia include no headaches or tremors. Pertinent negatives for diabetes include no chest pain, no fatigue, no polydipsia and no polyuria.    Review of Systems  Constitutional:  Negative for appetite change, fatigue, fever and unexpected weight change.  HENT:  Negative for tinnitus and trouble swallowing.   Eyes:  Negative for visual disturbance.  Respiratory:  Negative for cough, chest tightness and shortness of breath.   Cardiovascular:  Negative for chest pain, palpitations and leg swelling.  Gastrointestinal:  Negative for abdominal pain.  Endocrine: Negative for polydipsia and polyuria.  Genitourinary:  Negative for dysuria and hematuria.  Musculoskeletal:  Negative for arthralgias.  Neurological:  Negative for tremors, numbness and headaches.  Psychiatric/Behavioral:  Negative for dysphoric mood.      Lab Results  Component Value Date   NA 136 05/12/2024   K 4.5 05/12/2024   CO2 22 05/12/2024   GLUCOSE 116 (H) 05/12/2024   BUN 29 (H) 05/12/2024   CREATININE 1.36 (H) 05/12/2024   CALCIUM  9.3 05/12/2024   GFRNONAA 43 (L) 05/12/2024   Lab Results  Component Value Date   CHOL 146 01/23/2024   HDL 77 01/23/2024   LDLCALC 54 01/23/2024   TRIG 76 01/23/2024   CHOLHDL 1.9 01/23/2024   Lab Results  Component Value Date   TSH 2.170 01/23/2024   Lab Results  Component Value Date   HGBA1C 6.6 (A) 05/25/2024   Lab Results  Component Value Date   WBC 5.4 05/12/2024   HGB 11.8 (L) 05/12/2024   HCT 34.9 (L) 05/12/2024   MCV 84.1 05/12/2024   PLT 103 (L) 05/12/2024   Lab Results  Component Value Date   ALT 50 (H) 05/12/2024   AST 48 (H) 05/12/2024   ALKPHOS 87 05/12/2024   BILITOT 0.4 05/12/2024   Lab Results  Component Value Date    VD25OH 25.64 (L) 12/11/2022     Patient Active Problem List   Diagnosis Date Noted   SI (sacroiliac) joint inflammation 01/07/2024   S/P aortic valve replacement 11/14/2023   Double vision with both eyes open 10/24/2023   Stage 4 chronic kidney disease (HCC) 10/08/2023   Jaw pain 03/13/2023   Prolonged QT interval 03/07/2023   Vitamin D  deficiency 12/09/2022   NICM (nonischemic cardiomyopathy) (HCC) 08/05/2022   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   Overweight (BMI 25.0-29.9) 02/04/2022   Complete left bundle branch block 09/06/2021   CKD stage 3a, GFR 45-59 ml/min (HCC) 09/06/2021   Gastroesophageal reflux disease without esophagitis 09/06/2021   Chronic systolic CHF (congestive heart failure) (HCC) 08/22/2021   Low immunoglobulin level    Multiple myeloma in remission (HCC) 05/14/2021   Neuropathy 04/02/2021   Frequent PVCs 12/04/2020   Coronary artery disease involving native coronary artery of native heart 04/26/2020   S/P autologous bone marrow transplantation (HCC) 09/10/2019   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 12/17/2018   Hyperlipidemia associated with type 2 diabetes mellitus (HCC) 11/25/2018   Hypomagnesemia 08/20/2018   B12 deficiency 08/20/2018   Thrombocytopenia 02/09/2018   Benign essential HTN 08/21/2017   Carotid artery stenosis, asymptomatic, bilateral 08/06/2017   Thyroid  nodule 08/06/2017   Iron  deficiency anemia due to chronic blood loss 05/13/2017   Spinal stenosis of lumbar region with neurogenic claudication 04/09/2017   Long  term prescription opiate use 09/07/2016   Pain medication agreement signed 07/08/2016   Spondylosis of lumbar region without myelopathy or radiculopathy 07/08/2016   Environmental and seasonal allergies 04/19/2016   Insomnia 04/19/2016   Asthma 04/19/2016   Type II diabetes mellitus with complication (HCC) 04/12/2016   Depression, major, single episode, in partial remission 04/12/2016   Irritable bowel syndrome (IBS) 04/12/2016   Hepatic  steatosis 04/12/2016   Multiple myeloma in remission (HCC) 04/02/2016   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HCC) 07/06/2015   History of stem cell transplant (HCC) 05/16/2015    Allergies  Allergen Reactions   Other Anaphylaxis    Hycomine   Oxycodone-Acetaminophen  Anaphylaxis    Swelling and rash  acetaminophen  / oxycodone   Celebrex  [Celecoxib ] Diarrhea   Codeine     Plerixafor     In 2016 during ASCT collection patient developed fever to 103.59F and required hospitalization   Benadryl  [Diphenhydramine ] Palpitations   Morphine  Itching and Rash   Ondansetron  Diarrhea   Tylenol  [Acetaminophen ] Itching and Rash    Past Surgical History:  Procedure Laterality Date   ABDOMINAL HYSTERECTOMY  1997   Auto Stem Cell transplant  06/2015   CARDIAC ELECTROPHYSIOLOGY MAPPING AND ABLATION     CARPAL TUNNEL RELEASE Bilateral    CHOLECYSTECTOMY  2008   COLONOSCOPY WITH PROPOFOL  N/A 05/07/2017   Procedure: COLONOSCOPY WITH PROPOFOL ;  Surgeon: Therisa Bi, MD;  Location: [facility] ENDOSCOPY;  Service: Gastroenterology;  Laterality: N/A;   COLONOSCOPY WITH PROPOFOL  N/A 12/19/2020   Procedure: COLONOSCOPY WITH PROPOFOL ;  Surgeon: Therisa Bi, MD;  Location: [facility] ENDOSCOPY;  Service: Gastroenterology;  Laterality: N/A;   ESOPHAGOGASTRODUODENOSCOPY (EGD) WITH PROPOFOL  N/A 05/07/2017   Procedure: ESOPHAGOGASTRODUODENOSCOPY (EGD) WITH PROPOFOL ;  Surgeon: Therisa Bi, MD;  Location: [facility] ENDOSCOPY;  Service: Gastroenterology;  Laterality: N/A;   FOOT SURGERY Bilateral    INCONTINENCE SURGERY  2009   INTERSTIM IMPLANT PLACEMENT     other     over active bladder   OTHER SURGICAL HISTORY     bladder stimulator    PARTIAL HYSTERECTOMY  03/1996   fibroids   PORTA CATH INSERTION N/A 03/10/2019   Procedure: PORTA CATH INSERTION;  Surgeon: Marea Selinda RAMAN, MD;  Location: [facility] INVASIVE CV LAB;  Service: Cardiovascular;  Laterality: N/A;   TONSILLECTOMY  2007   TRANSCATHETER AORTIC VALVE REPLACEMENT,  TRANSFEMORAL  11/06/2023   TUBAL LIGATION  1984    Social History   Tobacco Use   Smoking status: Former    Current packs/day: 0.00    Average packs/day: 1 pack/day for 20.0 years (20.0 ttl pk-yrs)    Types: Cigarettes    Start date: 07/02/1971    Quit date: 07/02/1991    Years since quitting: 32.9   Smokeless tobacco: Never  Vaping Use   Vaping status: Never Used  Substance Use Topics   Alcohol use: Not Currently    Alcohol/week: 4.0 standard drinks of alcohol    Types: 4 Standard drinks or equivalent per week    Comment: 1-2 wine coolers 2-3 times per week   Drug use: No     Medication list has been reviewed and updated.  Current Meds  Medication Sig   albuterol  (VENTOLIN  HFA) 108 (90 Base) MCG/ACT inhaler Inhale 2 puffs into the lungs every 6 (six) hours as needed for wheezing or shortness of breath.   allopurinol  (ZYLOPRIM ) 100 MG tablet TAKE 1 TABLET(100 MG) BY MOUTH DAILY as needed   benzonatate  (TESSALON ) 100 MG capsule Take 1 capsule (100  mg total) by mouth 3 (three) times daily as needed for cough.   bisoprolol  (ZEBETA ) 10 MG tablet Take 1 tablet (10 mg total) by mouth daily.   Blood Glucose Monitoring Suppl DEVI 1 each by Does not apply route as directed. Dispense based on patient and insurance preference. Use up to four times daily as directed. (FOR ICD-10 E10.9, E11.9).   cholestyramine  (QUESTRAN ) 4 GM/DOSE powder Take 1 packet (4 g total) by mouth 2 (two) times daily with a meal.   clotrimazole  (GYNE-LOTRIMIN  3) 2 % vaginal cream Place 1 Applicatorful vaginally at bedtime.   diazepam  (VALIUM ) 5 MG tablet TAKE 1/2 TO 1 TABLET(2.5 TO 5 MG) BY MOUTH AT BEDTIME AS NEEDED FOR INSOMNIA   diclofenac  sodium (VOLTAREN ) 1 % GEL Apply 2 g topically 4 (four) times daily. (Patient taking differently: Apply 2 g topically daily as needed.)   diphenoxylate -atropine  (LOMOTIL ) 2.5-0.025 MG tablet Take 2 tablets by mouth 4 (four) times daily as needed for diarrhea or loose stools.    ENTRESTO  24-26 MG Take 1 tablet by mouth 2 (two) times daily.   FLUoxetine  (PROZAC ) 40 MG capsule Take 1 capsule (40 mg total) by mouth daily.   fluticasone  (FLONASE ) 50 MCG/ACT nasal spray Place 2 sprays into both nostrils daily. (Patient taking differently: Place 2 sprays into both nostrils daily as needed.)   furosemide  (LASIX ) 40 MG tablet Take 0.5 tablets (20 mg total) by mouth daily. Home dose.   glipiZIDE  (GLUCOTROL  XL) 2.5 MG 24 hr tablet TAKE 1 TABLET(2.5 MG) BY MOUTH DAILY WITH BREAKFAST   Glucose Blood (BLOOD GLUCOSE TEST STRIPS) STRP 1 each by Does not apply route as directed. Dispense based on patient and insurance preference. Use up to four times daily as directed. (FOR ICD-10 E10.9, E11.9).   JARDIANCE 10 MG TABS tablet Take 10 mg by mouth daily.   Lancet Device MISC 1 each by Does not apply route as directed. Dispense based on patient and insurance preference. Use up to four times daily as directed. (FOR ICD-10 E10.9, E11.9).   Lancets MISC 1 each by Does not apply route as directed. Dispense based on patient and insurance preference. Use up to four times daily as directed. (FOR ICD-10 E10.9, E11.9).   lidocaine  (XYLOCAINE ) 2 % solution Use as directed 15 mLs in the mouth or throat every 3 (three) hours as needed for mouth pain (swish and spit).   metaxalone  (SKELAXIN ) 800 MG tablet Take 1 tablet (800 mg total) by mouth in the morning.   Multiple Vitamin (MULTIVITAMIN) tablet Take 1 tablet by mouth daily.   nystatin  (MYCOSTATIN ) 100000 UNIT/ML suspension Take by mouth.   omeprazole  (PRILOSEC) 40 MG capsule TAKE 1 CAPSULE IN THE MORNING AND TAKE 1 CAPSULE AT BEDTIME   potassium chloride  SA (KLOR-CON  M) 20 MEQ tablet Take 1 tablet (20 mEq total) by mouth daily.   rosuvastatin  (CRESTOR ) 5 MG tablet Take 1 tablet (5 mg total) by mouth daily.   spironolactone (ALDACTONE) 25 MG tablet Take by mouth.   tiZANidine  (ZANAFLEX ) 4 MG tablet Take 1 tablet (4 mg total) by mouth at bedtime.    traZODone  (DESYREL ) 100 MG tablet TAKE 1 TABLET AT BEDTIME   VITAMIN D , CHOLECALCIFEROL, PO Take 2,000 Units by mouth daily.       05/25/2024    9:50 AM 01/23/2024    9:42 AM 11/14/2023    1:17 PM 10/24/2023    2:35 PM  GAD 7 : Generalized Anxiety Score  Nervous, Anxious, on Edge 0  2 2 1   Control/stop worrying 0 1 1 1   Worry too much - different things  1 1 1   Trouble relaxing  1 1 0  Restless  0 0 0  Easily annoyed or irritable  0 0 0  Afraid - awful might happen  0 0 0  Total GAD 7 Score  5 5 3   Anxiety Difficulty  Somewhat difficult Not difficult at all Not difficult at all       05/25/2024    9:50 AM 05/21/2024   10:26 AM 05/19/2024    9:35 AM  Depression screen PHQ 2/9  Decreased Interest 0 0 0  Down, Depressed, Hopeless 0 0 0  PHQ - 2 Score 0 0 0  Altered sleeping   0  Tired, decreased energy   0  Change in appetite   0  Feeling bad or failure about yourself    0  Trouble concentrating   0  Moving slowly or fidgety/restless   0  Suicidal thoughts   0  PHQ-9 Score   0    BP Readings from Last 3 Encounters:  05/25/24 128/78  05/19/24 136/67  05/12/24 138/83    Physical Exam Vitals and nursing note reviewed.  Constitutional:      General: She is not in acute distress.    Appearance: Normal appearance. She is well-developed.  HENT:     Head: Normocephalic and atraumatic.  Cardiovascular:     Rate and Rhythm: Normal rate and regular rhythm.     Heart sounds: Murmur (mechanical valve) heard.  Pulmonary:     Effort: Pulmonary effort is normal. No respiratory distress.     Breath sounds: No wheezing or rhonchi.  Musculoskeletal:     Cervical back: Normal range of motion.     Right lower leg: No edema.     Left lower leg: No edema.  Lymphadenopathy:     Cervical: No cervical adenopathy.  Skin:    General: Skin is warm and dry.     Findings: No rash.  Neurological:     Mental Status: She is alert and oriented to person, place, and time.  Psychiatric:         Mood and Affect: Mood normal.        Behavior: Behavior normal.     Wt Readings from Last 3 Encounters:  05/25/24 164 lb (74.4 kg)  05/21/24 162 lb 4.8 oz (73.6 kg)  05/12/24 163 lb 8 oz (74.2 kg)    BP 128/78   Pulse 70   Ht 5' 2.5 (1.588 m)   Wt 164 lb (74.4 kg)   SpO2 95%   BMI 29.52 kg/m   Assessment and Plan:  Problem List Items Addressed This Visit       Unprioritized   Type II diabetes mellitus with complication (HCC) - Primary (Chronic)   Currently medications are glipzide and Jardiance.  No hypoglycemic episodes noted. Last visit medical regimen changes were none. Lab Results  Component Value Date   HGBA1C 6.4 (H) 01/23/2024  A1C today = 6.6       Relevant Orders   POCT glycosylated hemoglobin (Hb A1C) (Completed)   Asthma (Chronic)   Uses albuterol  prn only. Last filled in October by oncology.      Chronic systolic CHF (congestive heart failure) (HCC)   Followed by Cardiology On Entresto  and Jardiance.  EF has been stable      Other Visit Diagnoses       Long term current use  of oral hypoglycemic drug           No follow-ups on file.    Leita HILARIO Adie, MD [facility] [place]

## 2024-05-25 NOTE — Patient Instructions (Signed)
 Call [facility] to schedule your mammogram at [phone].

## 2024-05-26 ENCOUNTER — Other Ambulatory Visit: Payer: Self-pay | Admitting: Oncology

## 2024-05-26 ENCOUNTER — Inpatient Hospital Stay

## 2024-05-26 VITALS — BP 131/68 | HR 57 | Temp 98.6°F | Resp 18

## 2024-05-26 DIAGNOSIS — N184 Chronic kidney disease, stage 4 (severe): Secondary | ICD-10-CM | POA: Diagnosis not present

## 2024-05-26 DIAGNOSIS — C9001 Multiple myeloma in remission: Secondary | ICD-10-CM | POA: Diagnosis not present

## 2024-05-26 DIAGNOSIS — I5032 Chronic diastolic (congestive) heart failure: Secondary | ICD-10-CM | POA: Diagnosis not present

## 2024-05-26 DIAGNOSIS — I130 Hypertensive heart and chronic kidney disease with heart failure and stage 1 through stage 4 chronic kidney disease, or unspecified chronic kidney disease: Secondary | ICD-10-CM | POA: Diagnosis not present

## 2024-05-26 DIAGNOSIS — D801 Nonfamilial hypogammaglobulinemia: Secondary | ICD-10-CM | POA: Diagnosis not present

## 2024-05-26 DIAGNOSIS — E538 Deficiency of other specified B group vitamins: Secondary | ICD-10-CM | POA: Diagnosis not present

## 2024-05-26 DIAGNOSIS — I350 Nonrheumatic aortic (valve) stenosis: Secondary | ICD-10-CM | POA: Diagnosis not present

## 2024-05-26 LAB — MAGNESIUM: Magnesium: 1.7 mg/dL (ref 1.7–2.4)

## 2024-05-26 MED ORDER — SODIUM CHLORIDE 0.9 % IV SOLN
12.5000 mg | Freq: Once | INTRAVENOUS | Status: AC
  Administered 2024-05-26: 12.5 mg via INTRAVENOUS
  Filled 2024-05-26: qty 0.5

## 2024-05-26 MED ORDER — SODIUM CHLORIDE 0.9 % IV SOLN
INTRAVENOUS | Status: DC
  Filled 2024-05-26: qty 250

## 2024-05-26 MED ORDER — DIPHENOXYLATE-ATROPINE 2.5-0.025 MG PO TABS: 2.0000 | ORAL_TABLET | Freq: Four times a day (QID) | ORAL | 3 refills | Status: AC | PRN

## 2024-05-26 NOTE — Progress Notes (Signed)
 No Magnesium  needed today

## 2024-05-29 LAB — GENECONNECT MOLECULAR SCREEN: Genetic Analysis Overall Interpretation: NEGATIVE

## 2024-05-31 DIAGNOSIS — N1831 Chronic kidney disease, stage 3a: Secondary | ICD-10-CM | POA: Diagnosis not present

## 2024-05-31 DIAGNOSIS — N2581 Secondary hyperparathyroidism of renal origin: Secondary | ICD-10-CM | POA: Diagnosis not present

## 2024-05-31 DIAGNOSIS — C9000 Multiple myeloma not having achieved remission: Secondary | ICD-10-CM | POA: Diagnosis not present

## 2024-05-31 DIAGNOSIS — E1122 Type 2 diabetes mellitus with diabetic chronic kidney disease: Secondary | ICD-10-CM | POA: Diagnosis not present

## 2024-05-31 DIAGNOSIS — D631 Anemia in chronic kidney disease: Secondary | ICD-10-CM | POA: Diagnosis not present

## 2024-05-31 DIAGNOSIS — I10 Essential (primary) hypertension: Secondary | ICD-10-CM | POA: Diagnosis not present

## 2024-06-02 ENCOUNTER — Inpatient Hospital Stay: Attending: Oncology

## 2024-06-02 ENCOUNTER — Inpatient Hospital Stay

## 2024-06-02 VITALS — BP 122/64 | HR 57 | Temp 98.1°F | Resp 18

## 2024-06-02 DIAGNOSIS — Z85038 Personal history of other malignant neoplasm of large intestine: Secondary | ICD-10-CM | POA: Insufficient documentation

## 2024-06-02 DIAGNOSIS — E1122 Type 2 diabetes mellitus with diabetic chronic kidney disease: Secondary | ICD-10-CM | POA: Insufficient documentation

## 2024-06-02 DIAGNOSIS — R0981 Nasal congestion: Secondary | ICD-10-CM | POA: Diagnosis not present

## 2024-06-02 DIAGNOSIS — N3281 Overactive bladder: Secondary | ICD-10-CM | POA: Diagnosis not present

## 2024-06-02 DIAGNOSIS — Z886 Allergy status to analgesic agent status: Secondary | ICD-10-CM | POA: Insufficient documentation

## 2024-06-02 DIAGNOSIS — Z833 Family history of diabetes mellitus: Secondary | ICD-10-CM | POA: Insufficient documentation

## 2024-06-02 DIAGNOSIS — Z888 Allergy status to other drugs, medicaments and biological substances status: Secondary | ICD-10-CM | POA: Diagnosis not present

## 2024-06-02 DIAGNOSIS — I352 Nonrheumatic aortic (valve) stenosis with insufficiency: Secondary | ICD-10-CM | POA: Diagnosis not present

## 2024-06-02 DIAGNOSIS — Z9221 Personal history of antineoplastic chemotherapy: Secondary | ICD-10-CM | POA: Insufficient documentation

## 2024-06-02 DIAGNOSIS — I252 Old myocardial infarction: Secondary | ICD-10-CM | POA: Diagnosis not present

## 2024-06-02 DIAGNOSIS — Z808 Family history of malignant neoplasm of other organs or systems: Secondary | ICD-10-CM | POA: Insufficient documentation

## 2024-06-02 DIAGNOSIS — N184 Chronic kidney disease, stage 4 (severe): Secondary | ICD-10-CM | POA: Insufficient documentation

## 2024-06-02 DIAGNOSIS — Z79899 Other long term (current) drug therapy: Secondary | ICD-10-CM | POA: Insufficient documentation

## 2024-06-02 DIAGNOSIS — I5032 Chronic diastolic (congestive) heart failure: Secondary | ICD-10-CM | POA: Insufficient documentation

## 2024-06-02 DIAGNOSIS — I130 Hypertensive heart and chronic kidney disease with heart failure and stage 1 through stage 4 chronic kidney disease, or unspecified chronic kidney disease: Secondary | ICD-10-CM | POA: Insufficient documentation

## 2024-06-02 DIAGNOSIS — Z800 Family history of malignant neoplasm of digestive organs: Secondary | ICD-10-CM | POA: Insufficient documentation

## 2024-06-02 DIAGNOSIS — J329 Chronic sinusitis, unspecified: Secondary | ICD-10-CM | POA: Diagnosis not present

## 2024-06-02 DIAGNOSIS — Z8249 Family history of ischemic heart disease and other diseases of the circulatory system: Secondary | ICD-10-CM | POA: Insufficient documentation

## 2024-06-02 DIAGNOSIS — Z801 Family history of malignant neoplasm of trachea, bronchus and lung: Secondary | ICD-10-CM | POA: Insufficient documentation

## 2024-06-02 DIAGNOSIS — Z803 Family history of malignant neoplasm of breast: Secondary | ICD-10-CM | POA: Insufficient documentation

## 2024-06-02 DIAGNOSIS — Z86018 Personal history of other benign neoplasm: Secondary | ICD-10-CM | POA: Diagnosis not present

## 2024-06-02 DIAGNOSIS — Z87891 Personal history of nicotine dependence: Secondary | ICD-10-CM | POA: Diagnosis not present

## 2024-06-02 DIAGNOSIS — Z885 Allergy status to narcotic agent status: Secondary | ICD-10-CM | POA: Insufficient documentation

## 2024-06-02 DIAGNOSIS — C9001 Multiple myeloma in remission: Secondary | ICD-10-CM

## 2024-06-02 DIAGNOSIS — E538 Deficiency of other specified B group vitamins: Secondary | ICD-10-CM | POA: Diagnosis not present

## 2024-06-02 DIAGNOSIS — Z90711 Acquired absence of uterus with remaining cervical stump: Secondary | ICD-10-CM | POA: Insufficient documentation

## 2024-06-02 DIAGNOSIS — Z9049 Acquired absence of other specified parts of digestive tract: Secondary | ICD-10-CM | POA: Insufficient documentation

## 2024-06-02 DIAGNOSIS — D509 Iron deficiency anemia, unspecified: Secondary | ICD-10-CM | POA: Diagnosis not present

## 2024-06-02 DIAGNOSIS — H9209 Otalgia, unspecified ear: Secondary | ICD-10-CM | POA: Diagnosis not present

## 2024-06-02 DIAGNOSIS — Z8419 Family history of other disorders of kidney and ureter: Secondary | ICD-10-CM | POA: Insufficient documentation

## 2024-06-02 LAB — MAGNESIUM: Magnesium: 1.2 mg/dL — ABNORMAL LOW (ref 1.7–2.4)

## 2024-06-02 MED ORDER — MAGNESIUM SULFATE 4 GM/100ML IV SOLN
4.0000 g | Freq: Once | INTRAVENOUS | Status: AC
  Administered 2024-06-02: 4 g via INTRAVENOUS
  Filled 2024-06-02: qty 100

## 2024-06-02 MED ORDER — MAGNESIUM SULFATE 2 GM/50ML IV SOLN
2.0000 g | Freq: Once | INTRAVENOUS | Status: AC
  Administered 2024-06-02: 2 g via INTRAVENOUS
  Filled 2024-06-02: qty 50

## 2024-06-02 MED ORDER — SODIUM CHLORIDE 0.9 % IV SOLN
INTRAVENOUS | Status: DC
  Filled 2024-06-02: qty 250

## 2024-06-02 MED ORDER — SODIUM CHLORIDE 0.9 % IV SOLN
12.5000 mg | Freq: Once | INTRAVENOUS | Status: AC
  Administered 2024-06-02: 12.5 mg via INTRAVENOUS
  Filled 2024-06-02: qty 0.5

## 2024-06-08 MED FILL — Promethazine HCl Inj 25 MG/ML: INTRAMUSCULAR | Qty: 0.5 | Status: AC

## 2024-06-09 ENCOUNTER — Inpatient Hospital Stay

## 2024-06-09 ENCOUNTER — Other Ambulatory Visit: Payer: Self-pay

## 2024-06-09 VITALS — BP 114/61 | HR 62 | Temp 98.4°F | Resp 18

## 2024-06-09 DIAGNOSIS — C9001 Multiple myeloma in remission: Secondary | ICD-10-CM

## 2024-06-09 LAB — MAGNESIUM: Magnesium: 1.5 mg/dL — ABNORMAL LOW (ref 1.7–2.4)

## 2024-06-09 MED ORDER — SODIUM CHLORIDE 0.9 % IV SOLN
INTRAVENOUS | Status: DC
  Filled 2024-06-09 (×2): qty 250

## 2024-06-09 MED ORDER — SODIUM CHLORIDE 0.9 % IV SOLN
12.5000 mg | Freq: Once | INTRAVENOUS | Status: AC
  Administered 2024-06-09: 12.5 mg via INTRAVENOUS
  Filled 2024-06-09: qty 0.5

## 2024-06-09 MED ORDER — MAGNESIUM SULFATE 4 GM/100ML IV SOLN
4.0000 g | Freq: Once | INTRAVENOUS | Status: AC
  Administered 2024-06-09: 4 g via INTRAVENOUS
  Filled 2024-06-09: qty 100

## 2024-06-16 ENCOUNTER — Encounter: Payer: Self-pay | Admitting: Nurse Practitioner

## 2024-06-16 ENCOUNTER — Inpatient Hospital Stay: Admitting: Nurse Practitioner

## 2024-06-16 ENCOUNTER — Inpatient Hospital Stay

## 2024-06-16 ENCOUNTER — Other Ambulatory Visit: Payer: Self-pay | Admitting: Internal Medicine

## 2024-06-16 VITALS — BP 115/71 | HR 73 | Temp 96.5°F | Resp 16 | Wt 163.0 lb

## 2024-06-16 DIAGNOSIS — C9001 Multiple myeloma in remission: Secondary | ICD-10-CM

## 2024-06-16 DIAGNOSIS — J069 Acute upper respiratory infection, unspecified: Secondary | ICD-10-CM

## 2024-06-16 DIAGNOSIS — R0981 Nasal congestion: Secondary | ICD-10-CM | POA: Diagnosis not present

## 2024-06-16 DIAGNOSIS — I2510 Atherosclerotic heart disease of native coronary artery without angina pectoris: Secondary | ICD-10-CM

## 2024-06-16 DIAGNOSIS — H9203 Otalgia, bilateral: Secondary | ICD-10-CM

## 2024-06-16 LAB — CMP (CANCER CENTER ONLY)
ALT: 38 U/L (ref 0–44)
AST: 29 U/L (ref 15–41)
Albumin: 4.3 g/dL (ref 3.5–5.0)
Alkaline Phosphatase: 112 U/L (ref 38–126)
Anion gap: 15 (ref 5–15)
BUN: 29 mg/dL — ABNORMAL HIGH (ref 8–23)
CO2: 20 mmol/L — ABNORMAL LOW (ref 22–32)
Calcium: 9.3 mg/dL (ref 8.9–10.3)
Chloride: 107 mmol/L (ref 98–111)
Creatinine: 1.57 mg/dL — ABNORMAL HIGH (ref 0.44–1.00)
GFR, Estimated: 36 mL/min — ABNORMAL LOW (ref >60)
Glucose, Bld: 176 mg/dL — ABNORMAL HIGH (ref 70–99)
Potassium: 4.4 mmol/L (ref 3.5–5.1)
Sodium: 141 mmol/L (ref 135–145)
Total Bilirubin: <0.2 mg/dL (ref 0.0–1.2)
Total Protein: 7 g/dL (ref 6.5–8.1)

## 2024-06-16 LAB — CBC WITH DIFFERENTIAL (CANCER CENTER ONLY)
Abs Immature Granulocytes: 0.02 K/uL (ref 0.00–0.07)
Basophils Absolute: 0 K/uL (ref 0.0–0.1)
Basophils Relative: 0 %
Eosinophils Absolute: 0.2 K/uL (ref 0.0–0.5)
Eosinophils Relative: 3 %
HCT: 33.8 % — ABNORMAL LOW (ref 36.0–46.0)
Hemoglobin: 11.2 g/dL — ABNORMAL LOW (ref 12.0–15.0)
Immature Granulocytes: 0 %
Lymphocytes Relative: 16 %
Lymphs Abs: 1.2 K/uL (ref 0.7–4.0)
MCH: 28.3 pg (ref 26.0–34.0)
MCHC: 33.1 g/dL (ref 30.0–36.0)
MCV: 85.4 fL (ref 80.0–100.0)
Monocytes Absolute: 0.5 K/uL (ref 0.1–1.0)
Monocytes Relative: 6 %
Neutro Abs: 5.6 K/uL (ref 1.7–7.7)
Neutrophils Relative %: 75 %
Platelet Count: 102 K/uL — ABNORMAL LOW (ref 150–400)
RBC: 3.96 MIL/uL (ref 3.87–5.11)
RDW: 13.7 % (ref 11.5–15.5)
WBC Count: 7.5 K/uL (ref 4.0–10.5)
nRBC: 0 % (ref 0.0–0.2)

## 2024-06-16 LAB — RESP PANEL BY RT-PCR (RSV, FLU A&B, COVID)  RVPGX2
Influenza A by PCR: NEGATIVE
Influenza B by PCR: NEGATIVE
Resp Syncytial Virus by PCR: NEGATIVE
SARS Coronavirus 2 by RT PCR: NEGATIVE

## 2024-06-16 LAB — MAGNESIUM: Magnesium: 1.4 mg/dL — ABNORMAL LOW (ref 1.7–2.4)

## 2024-06-16 MED ORDER — SODIUM CHLORIDE 0.9 % IV SOLN
12.5000 mg | Freq: Once | INTRAVENOUS | Status: AC
  Administered 2024-06-16: 10:00:00 12.5 mg via INTRAVENOUS
  Filled 2024-06-16: qty 0.5

## 2024-06-16 MED ORDER — PREDNISONE 20 MG PO TABS: ORAL_TABLET | ORAL | 0 refills | Status: DC

## 2024-06-16 MED ORDER — DOXYCYCLINE HYCLATE 100 MG PO TABS: 100.0000 mg | ORAL_TABLET | Freq: Two times a day (BID) | ORAL | 0 refills | Status: DC

## 2024-06-16 MED ORDER — SODIUM CHLORIDE 0.9 % IV SOLN
INTRAVENOUS | Status: DC
  Filled 2024-06-16: qty 250

## 2024-06-16 MED ORDER — MAGNESIUM SULFATE 4 GM/100ML IV SOLN
4.0000 g | Freq: Once | INTRAVENOUS | Status: AC
  Administered 2024-06-16: 11:00:00 4 g via INTRAVENOUS
  Filled 2024-06-16: qty 100

## 2024-06-16 NOTE — Progress Notes (Unsigned)
 Pt being seen in [facility] today for cough and chest congestion.  Pt had same symptoms a month ago and was treated for infection.  Pt reports having same symptoms which started again the end of last week.

## 2024-06-16 NOTE — Progress Notes (Signed)
 Pt reports having chest and sinus congestion with a cough, no fever reported. Notified Dr. Babara team to see if pt should be seen by symptom management clinic [facility]). Dr. Babara would like pt to be seen by [facility]. Pt brought back to [facility] clinic @ 0950 by Delon Barba, RN.

## 2024-06-18 NOTE — Progress Notes (Unsigned)
 " Hematology/Oncology Progress note [phone] [phone]     Chief Complaint: Sarah [name] is a 68 y.o. 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   ASSESSMENT & PLAN:   No problem-specific Assessment & Plan notes found for this encounter.     Orders Placed This Encounter  Procedures   Resp panel by RT-PCR (RSV, Flu A&B, Covid)   CBC with Differential (Cancer Center Only)    Standing Status:   Future    Number of Occurrences:   1    Expected Date:   06/16/2024    Expiration Date:   06/16/2025   CMP (Cancer Center only)    Standing Status:   Future    Number of Occurrences:   1    Expected Date:   06/16/2024    Expiration Date:   06/16/2025   Droplet precaution    Standing Status:   Standing    Number of Occurrences:   1    Follow up  Lab Mag weekly x5  + IV mag.  6 weeks flex lab MD IV mag,   All questions were answered. The patient knows to call the clinic with any problems, questions or concerns.  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   Initial bone marrow revealed 80% plasma cells.  Lambda free light chains were 1340.  She had nephrotic range proteinuria.  She initially underwent induction with RVD.  Revlimid  maintenance was discontinued on 01/21/2017 secondary to intolerance.     07/19/2019 Bone marrow aspirate  and biopsy on  revealed a normocellular marrow with but increased lambda-restricted plasma cells (9% aspirate, 40% CD138 immunohistochemistry).  Findings were consistent with recurrent plasma cell myeloma.  Flow cytometry revealed no monoclonal B-cell or phenotypically aberrant T-cell population. Cytogenetics were 21, XX (normal).  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    Lambda light chains have been followed: 22.2 (ratio 0.56) on 07/03/2017, 30.8 (ratio 0.78) on 09/02/2017, 36.9 (ratio 0.40) on 10/21/2017, 37.4 (ratio 0.41) on 12/16/2017, 70.7(ratio 0.31)  on 02/17/2018, 64.2 (ratio 0.27) on 04/07/2018, 78.9 (ratio 0.18) on 05/26/2018, 128.8 (ratio 0.17) on 08/06/2018, 181.5 (ratio 0.13) on 10/08/2018, 130.9 (ratio 0.13) on 10/20/2018, 160.7 (ratio 0.10) on 12/09/2018, 236.6 (ratio 0.07) on 02/01/2019, 363.6 (ratio 0.04) on 03/22/2019, 404.8 (ratio 0.04) on 04/05/2019, 420.7 (ratio 0.03) on 05/24/2019, 573.4 (ratio 0.03) on 06/23/2019, 451.05 (ratio 0.02) on 08/20/2019, 47.2 (ratio 0.13) on 10/25/2019, 22.4 (ratio 0.21) on 11/25/2019, 16.5 (ratio 0.33) on 01/10/2020, 14.6 (ratio 0.34) on 02/07/2020, 13.1 (ratio 0.31) on 03/07/2020, 10.1 (ratio 0.38) on 04/10/2020, and 9.5 (ratio 0.21) on 06/19/2020.   24 hour UPEP on 06/03/2019 revealed kappa free light chains 95.76, lambda free light chains 1,260.71, and ratio 0.08.  24 hour UPEP on 08/23/2019 revealed total protein of 782 mg/24 hrs with lambda free light chains 1,084.16 mg/L and ratio of 0.10 (1.03-31.76).  M spike  in urine was 46.1% (361 mg/24 hrs).    Bone survey on 04/08/2016 and 05/28/2017 revealed no definite lytic lesion seen in the visualized skeleton.  Bone survey on 11/19/2018 revealed no suspicious lucent lesions and no acute bony abnormality.  PET scan on 07/12/2019 revealed no focal metabolic activity to suggest active myeloma within the skeleton. There were no lytic lesions identified on the CT portion of the exam or soft  tissue plasmacytomas. There was no evidence of multiple myeloma.    Pretreatment RBC phenotype on 09/23/2019 was positive for C, e, DUFFY B, KIDD B, M, S, and s antigen; negative for c, E, KELL, DUFFY A, KIDD A, and N antigen.    09/27/2019 - 10/25/2019; 12/09/2019 - 03/13/2020 6 cycles of daratumumab  and hyaluronidase -fihj, Pomalyst , and Decadron  (DPd) .  Cycle #1 was complicated by fever and neutropenia requiring admission.  Cycle #2 was complicated by pneumonia requiring admission.  Cycle #6 was complicated with an ER evaluation for an elevated lactic acid.   05/10/2020 second autologous stem cell transplant at [facility]   She underwent conditioning with melphalan 140 mg/m2.    She is s/p week #3 Velcade  maintenance (began 08/31/2020 - 09/28/2020).  She has no increase in neuropathy.  She has severe aortic stenosis.  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 Cardiology is following for a possible TAVR in the future.  Echo on 07/05/2020 revealed moderate to severe aortic stenosis with an EF of 50-55%.  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 Zometa  was discontinued in 01/2017.  She has chronic nausea on Phenergan .     B12 deficiency.  B12 was 254 on 04/09/2017, 295 on 08/20/2018, and 391 on 10/08/2018.  She was on oral B12.  She received B12 monthly (last 09/14/2020).  Folate was 12.1 on 01/10/2020.    She has iron  deficiency.  Ferritin was 32 on 07/01/2019.  She received Venofer  on 07/15/2019 and 07/22/2019.   She has hypogammaglobulinemia.  IgG was 245 on 11/25/2019.  She received monthly IVIG (07/22/021 - 03/22/2020).  She received IVIG 400 mg/kg on 01/20/2020, 200 mg/kg on 02/17/2020, and 300 mg/kg on 03/22/2020.  IVIG on 01/20/2020 was complicated by acute renal failure. IgG trough level was 418 on 02/17/2020.  10/15/2019 - 10/20/2019 She was admitted to [facility] with fever and neutropenia.   Cultures were negative.  CXR was negative. She received broad spectrum antibiotics and daily Granix .  She received IVF for acute renal insufficiency due to diarrhea and dehydration.  Creatine was 1.66 on admission and 1.12 on discharge.    03/01/2020 - 03/05/2020 admitted to [facility] from with fever and neutropenia. CXR revealed no active cardiopulmonary disease. Chest CT with contrast revealed no acute intrathoracic pathology. There were findings which could be suggestive of prior granulomatous disease. She was treated with Cefepime  and Vancomycin , then switched to ciprofloxacin  on 03/03/2020.   05/08/2020 - 12/05/2021The patient was admitted to [facility] from  for autologous bone marrow transplant.   She received conditioning with melphalan 140 mg/m2.  Bone marrow transplant was on 05/10/2020. The patient developed fevers daily from 05/19/2020 - 05/24/2020. Blood cultures were + for strept sanguis bacteremia.  She was treated cefepime , vancomycin , and solumedrol for possible engraftment syndrome. Cefepime  was switched to ceftriaxone ; she completed 2 weeks of ceftriaxone  on 06/03/2020. She had chemotherapy induced diarrhea.  She experienced urinary retention.  Feb 2022 patient has been on maintenance Velcade  2  mg every 2 weeks.  Chronic diarrhea seen by gastroenterology Dr. Therisa and was recommended to avoid artificial sugars in her diet including Diet Coke and coffee mate.  She feels some improvement of her diarrhea frequency since the dietary modification.  GI profile PCR negative, C. difficile toxin A+ B negative, fecal calprotectin 77 12/19/2020 colonoscopy showed 2 subcentimeter polyps in the ascending colon, resected and removed.  Pathology showed tubular adenoma.  No malignancy.  Normal mucosa in the entire examined colon.  Biopsied, pathology showed colonic mucosa with no significant pathological alteration.  Negative for active inflammation and features of chronicity.  Negative for microscopic colitis,  dysplasia, and malignanc  Diabetes, patient has discontinued metformin  due to diarrhea and started on glipizide  2.5 mg   03/05/2021-03/09/2021 Patient was hospitalized due to fever and chills, nonproductive cough, shortness of breath. X-ray showed right lower lobe atelectasis versus pneumonia.  Blood cultures negative.  Urine culture showed 60,000 colonies of Enterococcus facialis.  Patient received antibiotics. Patient also had abdominal pelvis CT scan for work-up of intermittent lower abdomen pain.No acute abnormality.   05/14/21 last dose of Velcade  maintenance.  establish care with neurology Dr. Lane Hussar and her neuropathy regimen has been switched from gabapentin  to Lyrica . 05/29/2021 patient got influenza   neuropathy medication has been switched from gabapentin /Lyrica  to nortriptyline.  08/18/2021 Hospitalized due to pneumonia from metapnuemovirus, hospitalization was complicated with NSTEMI. She received heparin  gtt.  Treated with antibiotics for coverage of pneumonia,  diuretics for pulmonary edema. She received IVIG 400mg /kg x 1 for hypoglobulinemia. Discharged on 08/25/21 with Augmentin  and Zithromax .   May 2023, Daratumumab  being held for [facility] infectious disease physician Dr. Lorrene due to frequent infection.  Diagnosed with CHF [HFrEF] -NYHA class II-III, she follows up with cardiology, started on entresto   hospitalization due to CHF exacerbation, community acquired pneumonia.   Presented to emergency room on 07/01/2022 Respiratory panel RT-PCR negative for COVID-19, influenza and RSV.  + CHF [nonischemic cardiomyopathy], aortic stenosis, follows up with cardiology. 11/06/2023 s/p TAVR for aortic valve stenosis.  INTERVAL HISTORY Estalene Bergey is a 68 y.o. female who has above history reviewed by me today presents for follow up visit for management of multiple myeloma.  Patient has been on weekly magnesium  infusion as needed hypomagnesemia Upper respiratory infection  symptoms are resolved. No fever or chills.    Past Medical History:  Diagnosis Date   Amaurosis fugax of left eye 10/24/2023   Anemia    Anxiety    Aortic regurgitation 04/19/2016   Aortic stenosis    a. 05/2020 Echo: EF 45-50%, sev AS - seen by TAVR team @ [facility] - CTA sugg of tricuspid valve w/ fusing of 2 leaflets-TAVR deferred in setting of acute infxn; b. 07/2020 Echo: EF 50-55%, mod-sev AS; c. 12/2020 Echo: EF>55%. Mod-sev paradoxical low-flow low-gradient AS; d. 08/2021 Echo: EF 35-40%, severe AS, triv AI, mild MR.   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 Variable EF over time; b. 08/2017 Echo: EF 40%; b. 03/2020 Echo: EF 55-60%; c. 05/2020 Echo: EF 45-50%; d. 07/2020 Echo: EF 50-55%; e. 12/2020 Echo: EF>55%; f. 08/2021 Echo: EF 35-40%.   CHF (congestive heart failure) (HCC)    Chronic heart failure with preserved ejection fraction (HFpEF) (HCC)    a. Variable EF over time; b. 08/2017 Echo: EF  40%; b. 03/2020 Echo: EF 55-60%; c. 05/2020 Echo: EF 45-50%; d. 07/2020 Echo: EF 50-55%; e. 12/2020 Echo: EF>55%; f. 08/2021 Echo: EF 35-40%, no RV, mildly dil LA, mild MR, triv AI, severe AS.   Chronic pain syndrome 07/08/2016   CKD (chronic kidney disease) stage 3, GFR 30-59 ml/min (HCC)    Colon cancer (HCC)    Dad   Depression    Diabetes mellitus (HCC)    Dizziness    Fatty liver    Frequent falls    GERD (gastroesophageal reflux disease)    Gout    Heart murmur    History of blood transfusion    History of bone marrow transplant (HCC)    History of uterine fibroid    Hx of cardiac catheterization    a. 01/2016 Cath ([place]  - after abnl nuc): Nl cors.   Hypertension    Hypomagnesemia    IDA (iron  deficiency anemia)    Multiple myeloma (HCC)    Nonrheumatic aortic valve stenosis 06/05/2016   NSTEMI  (non-ST elevated myocardial infarction) (HCC) 08/21/2021   Personal history of chemotherapy    Pleurisy 04/09/2022   PSVT (paroxysmal supraventricular tachycardia)    a. S/p RFCA 1999 ([place] ).   PVC's (premature ventricular contractions)    a. Well-managed w/ bisoprolol  in outpt setting.   Renal cyst    Ulcer 2008   3 ulcers    Past Surgical History:  Procedure Laterality Date   ABDOMINAL HYSTERECTOMY  1997   Auto Stem Cell transplant  06/2015   CARDIAC ELECTROPHYSIOLOGY MAPPING AND ABLATION     CARPAL TUNNEL RELEASE Bilateral    CHOLECYSTECTOMY  2008   COLONOSCOPY WITH PROPOFOL  N/A 05/07/2017   Procedure: COLONOSCOPY WITH PROPOFOL ;  Surgeon: Therisa Bi, MD;  Location: [facility] ENDOSCOPY;  Service: Gastroenterology;  Laterality: N/A;   COLONOSCOPY WITH PROPOFOL  N/A 12/19/2020   Procedure: COLONOSCOPY WITH PROPOFOL ;  Surgeon: Therisa Bi, MD;  Location: [facility] ENDOSCOPY;  Service: Gastroenterology;  Laterality: N/A;   ESOPHAGOGASTRODUODENOSCOPY (EGD) WITH PROPOFOL  N/A 05/07/2017   Procedure: ESOPHAGOGASTRODUODENOSCOPY (EGD) WITH PROPOFOL ;  Surgeon: Therisa Bi, MD;  Location: [facility] ENDOSCOPY;  Service: Gastroenterology;  Laterality: N/A;   FOOT SURGERY Bilateral    INCONTINENCE SURGERY  2009   INTERSTIM IMPLANT PLACEMENT     other     over active bladder   OTHER SURGICAL HISTORY     bladder stimulator    PARTIAL HYSTERECTOMY  03/1996   fibroids   PORTA CATH INSERTION N/A 03/10/2019   Procedure: PORTA CATH INSERTION;  Surgeon: Marea Selinda RAMAN, MD;  Location: [facility] INVASIVE CV LAB;  Service: Cardiovascular;  Laterality: N/A;   TONSILLECTOMY  2007   TRANSCATHETER AORTIC VALVE REPLACEMENT, TRANSFEMORAL  11/06/2023   TUBAL LIGATION  1984    Family History  Problem Relation Age of Onset   Colon cancer Father    Renal Disease Father    Diabetes Mellitus II Father    Heart disease Father    Kidney disease Father    Melanoma Paternal Grandmother     Breast cancer Maternal Aunt 73   Anemia Mother    Heart disease Mother    Heart failure Mother    Renal Disease Mother    Congestive Heart Failure Mother    Kidney disease Mother    Heart disease Maternal Uncle    Throat cancer Maternal Uncle    Lung cancer Maternal Uncle    Liver disease Maternal Uncle    Heart failure Maternal Uncle    Hearing  loss Son 73       Suicide    Heart disease Son    Hearing loss Sister    Heart disease Son     Social History:  reports that she quit smoking about 32 years ago. Her smoking use included cigarettes. She started smoking about 53 years ago. She has a 20 pack-year smoking history. She has never used smokeless tobacco. She reports that she does not currently use alcohol after a past usage of about 4.0 standard drinks of alcohol per week. She reports that she does not use drugs.  She is on disability. She notes exposure to perchloroethylene [facility]).   Allergies:  Allergies  Allergen Reactions   Other Anaphylaxis    Hycomine   Oxycodone-Acetaminophen  Anaphylaxis    Swelling and rash  acetaminophen  / oxycodone   Celebrex  [Celecoxib ] Diarrhea   Codeine     Plerixafor     In 2016 during ASCT collection patient developed fever to 103.59F and required hospitalization   Benadryl  [Diphenhydramine ] Palpitations   Morphine  Itching and Rash   Ondansetron  Diarrhea   Tylenol  [Acetaminophen ] Itching and Rash    Current Medications: Current Outpatient Medications  Medication Sig Dispense Refill   albuterol  (VENTOLIN  HFA) 108 (90 Base) MCG/ACT inhaler Inhale 2 puffs into the lungs every 6 (six) hours as needed for wheezing or shortness of breath. 17 each 0   allopurinol  (ZYLOPRIM ) 100 MG tablet TAKE 1 TABLET(100 MG) BY MOUTH DAILY as needed 90 tablet 1   bisoprolol  (ZEBETA ) 10 MG tablet Take 1 tablet (10 mg total) by mouth daily. 90 tablet 1   Blood Glucose Monitoring Suppl DEVI 1 each by Does not apply route as directed.  Dispense based on patient and insurance preference. Use up to four times daily as directed. (FOR ICD-10 E10.9, E11.9). 1 each 0   cholestyramine  (QUESTRAN ) 4 GM/DOSE powder Take 1 packet (4 g total) by mouth 2 (two) times daily with a meal. 240 packet 2   clotrimazole  (GYNE-LOTRIMIN  3) 2 % vaginal cream Place 1 Applicatorful vaginally at bedtime. 21 g 0   diazepam  (VALIUM ) 5 MG tablet TAKE 1/2 TO 1 TABLET(2.5 TO 5 MG) BY MOUTH AT BEDTIME AS NEEDED FOR INSOMNIA 30 tablet 5   diclofenac  sodium (VOLTAREN ) 1 % GEL Apply 2 g topically 4 (four) times daily. 100 g 1   diphenoxylate -atropine  (LOMOTIL ) 2.5-0.025 MG tablet Take 2 tablets by mouth 4 (four) times daily as needed for diarrhea or loose stools. 180 tablet 3   doxycycline  (VIBRA -TABS) 100 MG tablet Take 1 tablet (100 mg total) by mouth 2 (two) times daily. 10 tablet 0   ENTRESTO  24-26 MG Take 1 tablet by mouth 2 (two) times daily.     FLUoxetine  (PROZAC ) 40 MG capsule Take 1 capsule (40 mg total) by mouth daily. 90 capsule 1   fluticasone  (FLONASE ) 50 MCG/ACT nasal spray Place 2 sprays into both nostrils daily.     furosemide  (LASIX ) 40 MG tablet Take 0.5 tablets (20 mg total) by mouth daily. Home dose.     glipiZIDE  (GLUCOTROL  XL) 2.5 MG 24 hr tablet TAKE 1 TABLET(2.5 MG) BY MOUTH DAILY WITH BREAKFAST 90 tablet 1   Glucose Blood (BLOOD GLUCOSE TEST STRIPS) STRP 1 each by Does not apply route as directed. Dispense based on patient and insurance preference. Use up to four times daily as directed. (FOR ICD-10 E10.9, E11.9). 100 strip 0   JARDIANCE 10 MG TABS tablet Take 10 mg by mouth daily.     Lancet  Device MISC 1 each by Does not apply route as directed. Dispense based on patient and insurance preference. Use up to four times daily as directed. (FOR ICD-10 E10.9, E11.9). 1 each 0   Lancets MISC 1 each by Does not apply route as directed. Dispense based on patient and insurance preference. Use up to four times daily as directed. (FOR  ICD-10 E10.9, E11.9). 100 each 0   lidocaine  (XYLOCAINE ) 2 % solution Use as directed 15 mLs in the mouth or throat every 3 (three) hours as needed for mouth pain (swish and spit). 100 mL 0   metaxalone  (SKELAXIN ) 800 MG tablet Take 1 tablet (800 mg total) by mouth in the morning. 30 tablet 0   Multiple Vitamin (MULTIVITAMIN) tablet Take 1 tablet by mouth daily.     nystatin  (MYCOSTATIN ) 100000 UNIT/ML suspension Take by mouth.     omeprazole  (PRILOSEC) 40 MG capsule TAKE 1 CAPSULE IN THE MORNING AND TAKE 1 CAPSULE AT BEDTIME 180 capsule 3   potassium chloride  SA (KLOR-CON  M) 20 MEQ tablet Take 1 tablet (20 mEq total) by mouth daily. 7 tablet 0   predniSONE  (DELTASONE ) 20 MG tablet Take 2 tablets (40 mg) daily for 3 days 6 tablet 0   rosuvastatin  (CRESTOR ) 5 MG tablet Take 1 tablet (5 mg total) by mouth daily. 90 tablet 1   spironolactone (ALDACTONE) 25 MG tablet Take by mouth.     tiZANidine  (ZANAFLEX ) 4 MG tablet Take 1 tablet (4 mg total) by mouth at bedtime. 90 tablet 0   traZODone  (DESYREL ) 100 MG tablet TAKE 1 TABLET AT BEDTIME 90 tablet 3   VITAMIN D , CHOLECALCIFEROL, PO Take 2,000 Units by mouth daily.     benzonatate  (TESSALON ) 100 MG capsule Take 1 capsule (100 mg total) by mouth 3 (three) times daily as needed for cough. 20 capsule 0   No current facility-administered medications for this visit.   Facility-Administered Medications Ordered in Other Visits  Medication Dose Route Frequency Provider Last Rate Last Admin   sodium chloride  flush (NS) 0.9 % injection 10 mL  10 mL Intravenous PRN Babara Call, MD   10 mL at 04/02/21 0904   sodium chloride  flush (NS) 0.9 % injection 10 mL  10 mL Intracatheter Once PRN Babara Call, MD        Review of Systems  Constitutional:  Negative for chills, fever, malaise/fatigue and weight loss.  HENT:  Positive for congestion and sinus pain. Negative for sore throat.   Eyes:  Negative for redness.  Respiratory:  Negative for cough,  shortness of breath and wheezing.   Cardiovascular:  Negative for chest pain, palpitations and leg swelling.  Gastrointestinal:  Negative for abdominal pain, blood in stool, diarrhea, nausea and vomiting.  Genitourinary:  Negative for dysuria.  Musculoskeletal:  Positive for back pain and joint pain. Negative for myalgias.  Skin:  Negative for rash.  Neurological:  Positive for tingling and sensory change. Negative for dizziness and tremors.  Endo/Heme/Allergies:  Does not bruise/bleed easily.  Psychiatric/Behavioral:  Negative for hallucinations.     Performance status (ECOG): 1  Vitals Blood pressure 115/71, pulse 73, temperature (!) 96.5 F (35.8 C), temperature source Tympanic, resp. rate 16, weight 163 lb (73.9 kg), SpO2 96%.  Physical Exam Constitutional:      General: She is not in acute distress.    Appearance: She is not diaphoretic.  HENT:     Head: Normocephalic and atraumatic.  Eyes:     General: No scleral icterus. Cardiovascular:  Rate and Rhythm: Normal rate.     Heart sounds: Murmur heard.  Pulmonary:     Effort: Pulmonary effort is normal. No respiratory distress.     Breath sounds: Normal breath sounds. No wheezing.  Abdominal:     General: There is no distension.  Musculoskeletal:        General: Normal range of motion.     Cervical back: Normal range of motion and neck supple.     Comments: Bilateral SI joint tenderness.   Skin:    General: Skin is warm and dry.     Findings: No erythema.  Neurological:     Mental Status: She is alert and oriented to person, place, and time. Mental status is at baseline.     Motor: No abnormal muscle tone.  Psychiatric:        Mood and Affect: Mood and affect normal.      Laboratory data    Latest Ref Rng & Units 06/16/2024   11:04 AM 05/12/2024    1:25 PM 03/31/2024    9:31 AM  CBC  WBC 4.0 - 10.5 K/uL 7.5  5.4  5.2   Hemoglobin 12.0 - 15.0 g/dL 88.7  88.1  88.2   Hematocrit 36.0 - 46.0 % 33.8  34.9  34.9    Platelets 150 - 400 K/uL 102  103  117       Latest Ref Rng & Units 06/16/2024   11:04 AM 05/12/2024    1:25 PM 03/31/2024    9:31 AM  CMP  Glucose 70 - 99 mg/dL 823  883  880   BUN 8 - 23 mg/dL 29  29  28    Creatinine 0.44 - 1.00 mg/dL 8.42  8.63  8.46   Sodium 135 - 145 mmol/L 141  136  138   Potassium 3.5 - 5.1 mmol/L 4.4  4.5  3.8   Chloride 98 - 111 mmol/L 107  104  110   CO2 22 - 32 mmol/L 20  22  19    Calcium  8.9 - 10.3 mg/dL 9.3  9.3  8.8   Total Protein 6.5 - 8.1 g/dL 7.0  7.5  7.0   Total Bilirubin 0.0 - 1.2 mg/dL <9.7  0.4  0.7   Alkaline Phos 38 - 126 U/L 112  87  98   AST 15 - 41 U/L 29  48  27   ALT 0 - 44 U/L 38  50  31      "

## 2024-06-19 NOTE — Telephone Encounter (Signed)
 Requested Prescriptions  Pending Prescriptions Disp Refills   rosuvastatin  (CRESTOR ) 5 MG tablet [Pharmacy Med Name: ROSUVASTATIN  5MG  TABLETS] 90 tablet 0    Sig: TAKE 1 TABLET(5 MG) BY MOUTH DAILY     Cardiovascular:  Antilipid - Statins 2 Failed - 06/19/2024  7:07 AM      Failed - Cr in normal range and within 360 days    Creatinine  Date Value Ref Range Status  06/16/2024 1.57 (H) 0.44 - 1.00 mg/dL Final         Failed - Lipid Panel in normal range within the last 12 months    Cholesterol, Total  Date Value Ref Range Status  01/23/2024 146 100 - 199 mg/dL Final   LDL Chol Calc (NIH)  Date Value Ref Range Status  01/23/2024 54 0 - 99 mg/dL Final   HDL  Date Value Ref Range Status  01/23/2024 77 >39 mg/dL Final   Triglycerides  Date Value Ref Range Status  01/23/2024 76 0 - 149 mg/dL Final         Passed - Patient is not pregnant      Passed - Valid encounter within last 12 months    Recent Outpatient Visits           3 weeks ago Type II diabetes mellitus with complication (HCC)   [place] [facility], Leita DEL, MD   4 months ago Benign essential HTN   [place] [facility] at [facility], Leita DEL, MD   7 months ago S/P aortic valve replacement   [facility] at [facility], Leita DEL, MD   7 months ago Amaurosis fugax of left eye   [facility] at [facility], Leita DEL, MD

## 2024-06-20 ENCOUNTER — Encounter: Payer: Self-pay | Admitting: Oncology

## 2024-06-20 ENCOUNTER — Encounter: Payer: Self-pay | Admitting: Internal Medicine

## 2024-06-22 MED FILL — Promethazine HCl Inj 25 MG/ML: INTRAMUSCULAR | Qty: 0.5 | Status: AC

## 2024-06-23 ENCOUNTER — Inpatient Hospital Stay: Admitting: Oncology

## 2024-06-23 ENCOUNTER — Inpatient Hospital Stay

## 2024-06-23 ENCOUNTER — Encounter: Payer: Self-pay | Admitting: Oncology

## 2024-06-23 VITALS — BP 136/70 | HR 57 | Temp 96.0°F | Resp 18 | Wt 165.0 lb

## 2024-06-23 DIAGNOSIS — D500 Iron deficiency anemia secondary to blood loss (chronic): Secondary | ICD-10-CM

## 2024-06-23 DIAGNOSIS — E538 Deficiency of other specified B group vitamins: Secondary | ICD-10-CM | POA: Diagnosis not present

## 2024-06-23 DIAGNOSIS — C9001 Multiple myeloma in remission: Secondary | ICD-10-CM

## 2024-06-23 DIAGNOSIS — J329 Chronic sinusitis, unspecified: Secondary | ICD-10-CM | POA: Insufficient documentation

## 2024-06-23 DIAGNOSIS — J0191 Acute recurrent sinusitis, unspecified: Secondary | ICD-10-CM | POA: Diagnosis not present

## 2024-06-23 DIAGNOSIS — N184 Chronic kidney disease, stage 4 (severe): Secondary | ICD-10-CM

## 2024-06-23 DIAGNOSIS — I5022 Chronic systolic (congestive) heart failure: Secondary | ICD-10-CM

## 2024-06-23 LAB — CBC WITH DIFFERENTIAL (CANCER CENTER ONLY)
Abs Immature Granulocytes: 0.13 K/uL — ABNORMAL HIGH (ref 0.00–0.07)
Basophils Absolute: 0 K/uL (ref 0.0–0.1)
Basophils Relative: 0 %
Eosinophils Absolute: 0.1 K/uL (ref 0.0–0.5)
Eosinophils Relative: 2 %
HCT: 34.4 % — ABNORMAL LOW (ref 36.0–46.0)
Hemoglobin: 11.5 g/dL — ABNORMAL LOW (ref 12.0–15.0)
Immature Granulocytes: 2 %
Lymphocytes Relative: 22 %
Lymphs Abs: 1.8 K/uL (ref 0.7–4.0)
MCH: 28.1 pg (ref 26.0–34.0)
MCHC: 33.4 g/dL (ref 30.0–36.0)
MCV: 84.1 fL (ref 80.0–100.0)
Monocytes Absolute: 0.5 K/uL (ref 0.1–1.0)
Monocytes Relative: 7 %
Neutro Abs: 5.3 K/uL (ref 1.7–7.7)
Neutrophils Relative %: 67 %
Platelet Count: 92 K/uL — ABNORMAL LOW (ref 150–400)
RBC: 4.09 MIL/uL (ref 3.87–5.11)
RDW: 13.7 % (ref 11.5–15.5)
WBC Count: 7.8 K/uL (ref 4.0–10.5)
nRBC: 0 % (ref 0.0–0.2)

## 2024-06-23 LAB — CMP (CANCER CENTER ONLY)
ALT: 41 U/L (ref 0–44)
AST: 32 U/L (ref 15–41)
Albumin: 4.4 g/dL (ref 3.5–5.0)
Alkaline Phosphatase: 104 U/L (ref 38–126)
Anion gap: 14 (ref 5–15)
BUN: 35 mg/dL — ABNORMAL HIGH (ref 8–23)
CO2: 21 mmol/L — ABNORMAL LOW (ref 22–32)
Calcium: 8.8 mg/dL — ABNORMAL LOW (ref 8.9–10.3)
Chloride: 105 mmol/L (ref 98–111)
Creatinine: 1.51 mg/dL — ABNORMAL HIGH (ref 0.44–1.00)
GFR, Estimated: 37 mL/min — ABNORMAL LOW
Glucose, Bld: 100 mg/dL — ABNORMAL HIGH (ref 70–99)
Potassium: 4 mmol/L (ref 3.5–5.1)
Sodium: 140 mmol/L (ref 135–145)
Total Bilirubin: 0.3 mg/dL (ref 0.0–1.2)
Total Protein: 7 g/dL (ref 6.5–8.1)

## 2024-06-23 LAB — MAGNESIUM: Magnesium: 1.4 mg/dL — ABNORMAL LOW (ref 1.7–2.4)

## 2024-06-23 MED ORDER — CYANOCOBALAMIN 1000 MCG/ML IJ SOLN
1000.0000 ug | Freq: Once | INTRAMUSCULAR | Status: AC
  Administered 2024-06-23: 1000 ug via INTRAMUSCULAR
  Filled 2024-06-23: qty 1

## 2024-06-23 MED ORDER — MAGNESIUM SULFATE 4 GM/100ML IV SOLN
4.0000 g | Freq: Once | INTRAVENOUS | Status: AC
  Administered 2024-06-23: 4 g via INTRAVENOUS
  Filled 2024-06-23: qty 100

## 2024-06-23 MED ORDER — SODIUM CHLORIDE 0.9 % IV SOLN
INTRAVENOUS | Status: DC
  Filled 2024-06-23: qty 250

## 2024-06-23 MED ORDER — SODIUM CHLORIDE 0.9 % IV SOLN
12.5000 mg | Freq: Once | INTRAVENOUS | Status: AC
  Administered 2024-06-23: 12.5 mg via INTRAVENOUS
  Filled 2024-06-23: qty 0.5

## 2024-06-23 NOTE — Assessment & Plan Note (Signed)
 Referred to ENT

## 2024-06-23 NOTE — Assessment & Plan Note (Signed)
 Lab Results  Component Value Date   HGB 11.5 (L) 06/23/2024   TIBC 368 01/07/2024   IRONPCTSAT 16 01/07/2024   FERRITIN 46 01/07/2024    Hb is stable

## 2024-06-23 NOTE — Assessment & Plan Note (Signed)
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  Light chain ratio is normal. 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 Repeat immunofixation negative. . Labs reviewed and discussed with patient, stable M protein and stable light chain ratio Off treatments due to frequent infections  Continue acyclovir  prophylaxis.

## 2024-06-23 NOTE — Assessment & Plan Note (Signed)
 Follow up with [facility] cardiology.

## 2024-06-23 NOTE — Assessment & Plan Note (Signed)
 continue B12 injections Q 6 weeks.

## 2024-06-23 NOTE — Assessment & Plan Note (Signed)
-  avoid nephrotoxins

## 2024-06-23 NOTE — Assessment & Plan Note (Signed)
#  Chronic hypomagnesemia proceed with  IV magnesium today.  Continue weekly magnesium +/- IV magnesium.

## 2024-06-23 NOTE — Progress Notes (Signed)
 " Hematology/Oncology Progress note [phone] [phone]     Chief Complaint: Sarah [name] is a 68 y.o. 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  Light chain ratio is normal. 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 Repeat immunofixation negative. . Labs reviewed and discussed with patient, stable M protein and stable light chain ratio Off treatments due to frequent infections  Continue acyclovir  prophylaxis.  B12 deficiency continue B12 injections Q 6 weeks.   Hypomagnesemia #Chronic hypomagnesemia proceed with  IV magnesium  today.  Continue weekly magnesium  +/- IV magnesium .     Iron  deficiency anemia due to chronic blood loss Lab Results  Component Value Date   HGB 11.5 (L) 06/23/2024   TIBC 368 01/07/2024   IRONPCTSAT 16 01/07/2024   FERRITIN 46 01/07/2024    Hb is stable   Stage 4 chronic kidney disease (HCC) avoid nephrotoxins.    Chronic systolic CHF (congestive heart failure) (HCC) Follow up with [facility] cardiology.   Sinusitis Referred to ENT    Orders Placed This Encounter  Procedures   CMP (Cancer Center only)    Standing Status:   Future    Expected Date:   08/04/2024    Expiration Date:   11/02/2024   CBC with Differential (Cancer Center Only)    Standing Status:   Future    Expected Date:   08/04/2024    Expiration Date:   11/02/2024   Multiple Myeloma Panel (SPEP&IFE w/QIG)    Standing Status:   Future    Expected Date:   08/04/2024    Expiration Date:   11/02/2024   Kappa/lambda light chains    Standing Status:    Future    Expected Date:   08/04/2024    Expiration Date:   11/02/2024   Vitamin B12    Standing Status:   Future    Expected Date:   08/04/2024    Expiration Date:   11/02/2024   Magnesium     Standing Status:   Future    Expected Date:   08/04/2024    Expiration Date:   11/02/2024   Magnesium     Standing Status:   Standing    Number of Occurrences:   5    Expiration Date:   06/23/2025    Follow up  Lab Mag weekly x5  + IV mag.  6 weeks flex lab MD IV mag,   All questions were answered. The patient knows to call the clinic with any problems, questions or concerns.  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   Initial bone marrow revealed 80% plasma cells.  Lambda free  light chains were 1340.  She had nephrotic range proteinuria.  She initially underwent induction with RVD.  Revlimid  maintenance was discontinued on 01/21/2017 secondary to intolerance.     07/19/2019 Bone marrow aspirate and biopsy on  revealed a normocellular marrow with but increased lambda-restricted plasma cells (9% aspirate, 40% CD138 immunohistochemistry).  Findings were consistent with recurrent plasma cell myeloma.  Flow cytometry revealed no monoclonal B-cell or phenotypically aberrant T-cell population. Cytogenetics were 66, XX (normal).  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    Lambda light chains have been followed: 22.2 (ratio 0.56) on 07/03/2017, 30.8 (ratio 0.78)  on 09/02/2017, 36.9 (ratio 0.40) on 10/21/2017, 37.4 (ratio 0.41) on 12/16/2017, 70.7(ratio 0.31)  on 02/17/2018, 64.2 (ratio 0.27) on 04/07/2018, 78.9 (ratio 0.18) on 05/26/2018, 128.8 (ratio 0.17) on 08/06/2018, 181.5 (ratio 0.13) on 10/08/2018, 130.9 (ratio 0.13) on 10/20/2018, 160.7 (ratio 0.10) on 12/09/2018, 236.6 (ratio 0.07) on 02/01/2019, 363.6 (ratio 0.04) on 03/22/2019, 404.8 (ratio 0.04) on 04/05/2019, 420.7 (ratio 0.03) on 05/24/2019, 573.4 (ratio 0.03) on 06/23/2019, 451.05 (ratio 0.02) on 08/20/2019, 47.2 (ratio 0.13) on 10/25/2019, 22.4 (ratio 0.21) on 11/25/2019, 16.5 (ratio 0.33) on 01/10/2020, 14.6 (ratio 0.34) on 02/07/2020, 13.1 (ratio 0.31) on 03/07/2020, 10.1 (ratio 0.38) on 04/10/2020, and 9.5 (ratio 0.21) on 06/19/2020.   24 hour UPEP on 06/03/2019 revealed kappa free light chains 95.76, lambda free light chains 1,260.71, and ratio 0.08.  24 hour UPEP on 08/23/2019 revealed total protein of 782 mg/24 hrs with lambda free light chains 1,084.16 mg/L and ratio of 0.10 (1.03-31.76).  M spike in urine was 46.1% (361 mg/24 hrs).    Bone survey on 04/08/2016 and 05/28/2017 revealed no definite lytic lesion seen in the visualized skeleton.  Bone survey on 11/19/2018 revealed no suspicious lucent lesions and no acute bony abnormality.  PET scan on 07/12/2019 revealed no focal metabolic activity to suggest active myeloma within the skeleton. There were no lytic lesions identified on the CT portion of the exam or soft tissue plasmacytomas. There was no evidence of multiple myeloma.    Pretreatment RBC phenotype on 09/23/2019 was positive for C, e, DUFFY B, KIDD B, M, S, and s antigen; negative for c, E, KELL, DUFFY A, KIDD A, and N antigen.    09/27/2019 - 10/25/2019; 12/09/2019 - 03/13/2020 6 cycles of daratumumab  and hyaluronidase -fihj, Pomalyst , and Decadron  (DPd) .  Cycle #1 was complicated by fever and neutropenia requiring admission.  Cycle #2 was complicated by pneumonia requiring  admission.  Cycle #6 was complicated with an ER evaluation for an elevated lactic acid.   05/10/2020 second autologous stem cell transplant at [facility]   She underwent conditioning with melphalan 140 mg/m2.    She is s/p week #3 Velcade  maintenance (began 08/31/2020 - 09/28/2020).  She has no increase in neuropathy.  She has severe aortic stenosis.  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 Cardiology is following for a possible TAVR in the future.  Echo on 07/05/2020 revealed moderate to severe aortic stenosis with an EF of 50-55%.  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 Zometa  was discontinued in 01/2017.  She has chronic nausea on Phenergan .     B12 deficiency.  B12 was 254 on 04/09/2017, 295 on 08/20/2018, and 391 on 10/08/2018.  She was on oral B12.  She received B12 monthly (last 09/14/2020).  Folate was 12.1 on 01/10/2020.    She  has iron  deficiency.  Ferritin was 32 on 07/01/2019.  She received Venofer  on 07/15/2019 and 07/22/2019.   She has hypogammaglobulinemia.  IgG was 245 on 11/25/2019.  She received monthly IVIG (07/22/021 - 03/22/2020).  She received IVIG 400 mg/kg on 01/20/2020, 200 mg/kg on 02/17/2020, and 300 mg/kg on 03/22/2020.  IVIG on 01/20/2020 was complicated by acute renal failure. IgG trough level was 418 on 02/17/2020.  10/15/2019 - 10/20/2019 She was admitted to [facility] with fever and neutropenia.  Cultures were negative.  CXR was negative. She received broad spectrum antibiotics and daily Granix .  She received IVF for acute renal insufficiency due to diarrhea and dehydration.  Creatine was 1.66 on admission and 1.12 on discharge.    03/01/2020 - 03/05/2020 admitted to [facility] from with fever and neutropenia. CXR revealed no active cardiopulmonary disease. Chest CT with contrast revealed no acute intrathoracic pathology. There were findings which could be suggestive of prior  granulomatous disease. She was treated with Cefepime  and Vancomycin , then switched to ciprofloxacin  on 03/03/2020.   05/08/2020 - 12/05/2021The patient was admitted to [facility] from  for autologous bone marrow transplant.   She received conditioning with melphalan 140 mg/m2.  Bone marrow transplant was on 05/10/2020. The patient developed fevers daily from 05/19/2020 - 05/24/2020. Blood cultures were + for strept sanguis bacteremia.  She was treated cefepime , vancomycin , and solumedrol for possible engraftment syndrome. Cefepime  was switched to ceftriaxone ; she completed 2 weeks of ceftriaxone  on 06/03/2020. She had chemotherapy induced diarrhea.  She experienced urinary retention.  Feb 2022 patient has been on maintenance Velcade  2 mg every 2 weeks.  Chronic diarrhea seen by gastroenterology Dr. Therisa and was recommended to avoid artificial sugars in her diet including Diet Coke and coffee mate.  She feels some improvement of her diarrhea frequency since the dietary modification.  GI profile PCR negative, C. difficile toxin A+ B negative, fecal calprotectin 77 12/19/2020 colonoscopy showed 2 subcentimeter polyps in the ascending colon, resected and removed.  Pathology showed tubular adenoma.  No malignancy.  Normal mucosa in the entire examined colon.  Biopsied, pathology showed colonic mucosa with no significant pathological alteration.  Negative for active inflammation and features of chronicity.  Negative for microscopic colitis, dysplasia, and malignanc  Diabetes, patient has discontinued metformin  due to diarrhea and started on glipizide  2.5 mg   03/05/2021-03/09/2021 Patient was hospitalized due to fever and chills, nonproductive cough, shortness of breath. X-ray showed right lower lobe atelectasis versus pneumonia.  Blood cultures negative.  Urine culture showed 60,000 colonies of Enterococcus facialis.  Patient received antibiotics. Patient also had abdominal pelvis CT scan for work-up of intermittent  lower abdomen pain.No acute abnormality.   05/14/21 last dose of Velcade  maintenance.  establish care with neurology Dr. Lane Hussar and her neuropathy regimen has been switched from gabapentin  to Lyrica . 05/29/2021 patient got influenza   neuropathy medication has been switched from gabapentin /Lyrica  to nortriptyline.  08/18/2021 Hospitalized due to pneumonia from metapnuemovirus, hospitalization was complicated with NSTEMI. She received heparin  gtt.  Treated with antibiotics for coverage of pneumonia,  diuretics for pulmonary edema. She received IVIG 400mg /kg x 1 for hypoglobulinemia. Discharged on 08/25/21 with Augmentin  and Zithromax .   May 2023, Daratumumab  being held for [facility] infectious disease physician Dr. Lorrene due to frequent infection.  Diagnosed with CHF [HFrEF] -NYHA class II-III, she follows up with cardiology, started on entresto   hospitalization due to CHF exacerbation, community acquired pneumonia.   Presented to emergency room on 07/01/2022 Respiratory panel RT-PCR negative for COVID-19,  influenza and RSV.  + CHF [nonischemic cardiomyopathy], aortic stenosis, follows up with cardiology. 11/06/2023 s/p TAVR for aortic valve stenosis.  INTERVAL HISTORY Wajiha Versteeg is a 68 y.o. female who has above history reviewed by me today presents for follow up visit for management of multiple myeloma.  Patient has been on weekly magnesium  infusion as needed hypomagnesemia Recurrent sinusitis symptoms -congestion and ear pain , recently finished another course of antibiotics.  Better today. Still has postnasal drip no fever or chill    Past Medical History:  Diagnosis Date   Amaurosis fugax of left eye 10/24/2023   Anemia    Anxiety    Aortic regurgitation 04/19/2016   Aortic stenosis    a. 05/2020 Echo: EF 45-50%, sev AS - seen by TAVR team @ [facility] - CTA sugg of tricuspid valve w/ fusing of 2 leaflets-TAVR deferred in setting of acute infxn; b. 07/2020 Echo: EF 50-55%, mod-sev  AS; c. 12/2020 Echo: EF>55%. Mod-sev paradoxical low-flow low-gradient AS; d. 08/2021 Echo: EF 35-40%, severe AS, triv AI, mild MR.   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 Variable EF over time; b. 08/2017 Echo: EF 40%; b. 03/2020 Echo: EF 55-60%; c. 05/2020 Echo: EF 45-50%; d. 07/2020 Echo: EF 50-55%; e. 12/2020 Echo: EF>55%; f. 08/2021 Echo: EF 35-40%.   CHF (congestive heart failure) (HCC)    Chronic heart failure with preserved ejection fraction (HFpEF) (HCC)    a. Variable EF over time; b. 08/2017 Echo: EF 40%; b. 03/2020 Echo: EF 55-60%; c. 05/2020 Echo: EF 45-50%; d. 07/2020 Echo: EF 50-55%; e. 12/2020 Echo: EF>55%; f. 08/2021 Echo: EF 35-40%, no RV, mildly dil LA, mild MR, triv AI, severe AS.   Chronic pain syndrome 07/08/2016   CKD (chronic kidney disease) stage 3, GFR 30-59 ml/min (HCC)    Colon cancer (HCC)    Dad   Depression    Diabetes mellitus (HCC)    Dizziness    Fatty liver    Frequent falls    GERD (gastroesophageal reflux disease)    Gout    Heart murmur    History of blood transfusion    History of bone marrow transplant (HCC)    History of uterine fibroid    Hx of cardiac catheterization    a. 01/2016 Cath ([place]  - after abnl nuc): Nl cors.   Hypertension    Hypomagnesemia    IDA (iron  deficiency anemia)    Multiple myeloma (HCC)    Nonrheumatic aortic valve stenosis 06/05/2016   NSTEMI (non-ST elevated myocardial infarction) (HCC) 08/21/2021   Personal history of chemotherapy    Pleurisy 04/09/2022   PSVT (paroxysmal supraventricular tachycardia)    a. S/p RFCA 1999 ([place] ).   PVC's (premature ventricular contractions)    a. Well-managed w/ bisoprolol  in outpt setting.   Renal cyst    Ulcer 2008   3 ulcers    Past Surgical History:  Procedure Laterality Date   ABDOMINAL HYSTERECTOMY  1997   Auto  Stem Cell transplant  06/2015   CARDIAC ELECTROPHYSIOLOGY MAPPING AND ABLATION     CARPAL TUNNEL RELEASE Bilateral    CHOLECYSTECTOMY  2008   COLONOSCOPY WITH PROPOFOL  N/A 05/07/2017   Procedure: COLONOSCOPY WITH PROPOFOL ;  Surgeon: Therisa Bi, MD;  Location: [facility] ENDOSCOPY;  Service: Gastroenterology;  Laterality: N/A;   COLONOSCOPY  WITH PROPOFOL  N/A 12/19/2020   Procedure: COLONOSCOPY WITH PROPOFOL ;  Surgeon: Therisa Bi, MD;  Location: [facility] ENDOSCOPY;  Service: Gastroenterology;  Laterality: N/A;   ESOPHAGOGASTRODUODENOSCOPY (EGD) WITH PROPOFOL  N/A 05/07/2017   Procedure: ESOPHAGOGASTRODUODENOSCOPY (EGD) WITH PROPOFOL ;  Surgeon: Therisa Bi, MD;  Location: [facility] ENDOSCOPY;  Service: Gastroenterology;  Laterality: N/A;   FOOT SURGERY Bilateral    INCONTINENCE SURGERY  2009   INTERSTIM IMPLANT PLACEMENT     other     over active bladder   OTHER SURGICAL HISTORY     bladder stimulator    PARTIAL HYSTERECTOMY  03/1996   fibroids   PORTA CATH INSERTION N/A 03/10/2019   Procedure: PORTA CATH INSERTION;  Surgeon: Marea Selinda RAMAN, MD;  Location: [facility] INVASIVE CV LAB;  Service: Cardiovascular;  Laterality: N/A;   TONSILLECTOMY  2007   TRANSCATHETER AORTIC VALVE REPLACEMENT, TRANSFEMORAL  11/06/2023   TUBAL LIGATION  1984    Family History  Problem Relation Age of Onset   Colon cancer Father    Renal Disease Father    Diabetes Mellitus II Father    Heart disease Father    Kidney disease Father    Melanoma Paternal Grandmother    Breast cancer Maternal Aunt 38   Anemia Mother    Heart disease Mother    Heart failure Mother    Renal Disease Mother    Congestive Heart Failure Mother    Kidney disease Mother    Heart disease Maternal Uncle    Throat cancer Maternal Uncle    Lung cancer Maternal Uncle    Liver disease Maternal Uncle    Heart failure Maternal Uncle    Hearing loss Son 7       Suicide    Heart disease Son    Hearing loss Sister    Heart disease Son     Social  History:  reports that she quit smoking about 33 years ago. Her smoking use included cigarettes. She started smoking about 53 years ago. She has a 20 pack-year smoking history. She has never used smokeless tobacco. She reports that she does not currently use alcohol after a past usage of about 4.0 standard drinks of alcohol per week. She reports that she does not use drugs.  She is on disability. She notes exposure to perchloroethylene [facility]).   Allergies:  Allergies  Allergen Reactions   Other Anaphylaxis    Hycomine   Oxycodone-Acetaminophen  Anaphylaxis    Swelling and rash  acetaminophen  / oxycodone   Celebrex  [Celecoxib ] Diarrhea   Codeine     Plerixafor     In 2016 during ASCT collection patient developed fever to 103.93F and required hospitalization   Benadryl  [Diphenhydramine ] Palpitations   Morphine  Itching and Rash   Ondansetron  Diarrhea   Tylenol  [Acetaminophen ] Itching and Rash    Current Medications: Current Outpatient Medications  Medication Sig Dispense Refill   albuterol  (VENTOLIN  HFA) 108 (90 Base) MCG/ACT inhaler Inhale 2 puffs into the lungs every 6 (six) hours as needed for wheezing or shortness of breath. 17 each 0   allopurinol  (ZYLOPRIM ) 100 MG tablet TAKE 1 TABLET(100 MG) BY MOUTH DAILY as needed 90 tablet 1   benzonatate  (TESSALON ) 100 MG capsule Take 1 capsule (100 mg total) by mouth 3 (three) times daily as needed for cough. 20 capsule 0   bisoprolol  (ZEBETA ) 10 MG tablet Take 1 tablet (10 mg total) by mouth daily. 90 tablet 1   Blood Glucose Monitoring Suppl DEVI 1 each by Does not apply route as  directed. Dispense based on patient and insurance preference. Use up to four times daily as directed. (FOR ICD-10 E10.9, E11.9). 1 each 0   cholestyramine  (QUESTRAN ) 4 GM/DOSE powder Take 1 packet (4 g total) by mouth 2 (two) times daily with a meal. 240 packet 2   clotrimazole  (GYNE-LOTRIMIN  3) 2 % vaginal cream Place 1 Applicatorful vaginally at bedtime. 21 g 0    diazepam  (VALIUM ) 5 MG tablet TAKE 1/2 TO 1 TABLET(2.5 TO 5 MG) BY MOUTH AT BEDTIME AS NEEDED FOR INSOMNIA 30 tablet 5   diclofenac  sodium (VOLTAREN ) 1 % GEL Apply 2 g topically 4 (four) times daily. 100 g 1   diphenoxylate -atropine  (LOMOTIL ) 2.5-0.025 MG tablet Take 2 tablets by mouth 4 (four) times daily as needed for diarrhea or loose stools. 180 tablet 3   ENTRESTO  24-26 MG Take 1 tablet by mouth 2 (two) times daily.     FLUoxetine  (PROZAC ) 40 MG capsule Take 1 capsule (40 mg total) by mouth daily. 90 capsule 1   fluticasone  (FLONASE ) 50 MCG/ACT nasal spray Place 2 sprays into both nostrils daily.     furosemide  (LASIX ) 40 MG tablet Take 0.5 tablets (20 mg total) by mouth daily. Home dose.     glipiZIDE  (GLUCOTROL  XL) 2.5 MG 24 hr tablet TAKE 1 TABLET(2.5 MG) BY MOUTH DAILY WITH BREAKFAST 90 tablet 1   Glucose Blood (BLOOD GLUCOSE TEST STRIPS) STRP 1 each by Does not apply route as directed. Dispense based on patient and insurance preference. Use up to four times daily as directed. (FOR ICD-10 E10.9, E11.9). 100 strip 0   JARDIANCE 10 MG TABS tablet Take 10 mg by mouth daily.     Lancet Device MISC 1 each by Does not apply route as directed. Dispense based on patient and insurance preference. Use up to four times daily as directed. (FOR ICD-10 E10.9, E11.9). 1 each 0   Lancets MISC 1 each by Does not apply route as directed. Dispense based on patient and insurance preference. Use up to four times daily as directed. (FOR ICD-10 E10.9, E11.9). 100 each 0   lidocaine  (XYLOCAINE ) 2 % solution Use as directed 15 mLs in the mouth or throat every 3 (three) hours as needed for mouth pain (swish and spit). 100 mL 0   metaxalone  (SKELAXIN ) 800 MG tablet Take 1 tablet (800 mg total) by mouth in the morning. 30 tablet 0   Multiple Vitamin (MULTIVITAMIN) tablet Take 1 tablet by mouth daily.     nystatin  (MYCOSTATIN ) 100000 UNIT/ML suspension Take by mouth.     omeprazole  (PRILOSEC) 40 MG capsule TAKE 1  CAPSULE IN THE MORNING AND TAKE 1 CAPSULE AT BEDTIME 180 capsule 3   potassium chloride  SA (KLOR-CON  M) 20 MEQ tablet Take 1 tablet (20 mEq total) by mouth daily. 7 tablet 0   predniSONE  (DELTASONE ) 20 MG tablet Take 2 tablets (40 mg) daily for 3 days 6 tablet 0   rosuvastatin  (CRESTOR ) 5 MG tablet TAKE 1 TABLET(5 MG) BY MOUTH DAILY 90 tablet 0   spironolactone (ALDACTONE) 25 MG tablet Take by mouth.     tiZANidine  (ZANAFLEX ) 4 MG tablet Take 1 tablet (4 mg total) by mouth at bedtime. 90 tablet 0   traZODone  (DESYREL ) 100 MG tablet TAKE 1 TABLET AT BEDTIME 90 tablet 3   VITAMIN D , CHOLECALCIFEROL, PO Take 2,000 Units by mouth daily.     No current facility-administered medications for this visit.   Facility-Administered Medications Ordered in Other Visits  Medication Dose Route  Frequency Provider Last Rate Last Admin   0.9 %  sodium chloride  infusion   Intravenous Continuous Babara Call, MD   Stopped at 06/23/24 1216   sodium chloride  flush (NS) 0.9 % injection 10 mL  10 mL Intravenous PRN Babara Call, MD   10 mL at 04/02/21 0904   sodium chloride  flush (NS) 0.9 % injection 10 mL  10 mL Intracatheter Once PRN Babara Call, MD        Review of Systems  Constitutional:  Negative for chills, fever, malaise/fatigue and weight loss.  HENT:  Negative for congestion and sore throat.        Postnasal drip  Eyes:  Negative for redness.  Respiratory:  Negative for cough, shortness of breath and wheezing.   Cardiovascular:  Negative for chest pain, palpitations and leg swelling.  Gastrointestinal:  Positive for diarrhea. Negative for abdominal pain, blood in stool, nausea and vomiting.  Genitourinary:  Negative for dysuria.  Musculoskeletal:  Positive for back pain and joint pain. Negative for myalgias.  Skin:  Negative for rash.  Neurological:  Positive for tingling and sensory change. Negative for dizziness and tremors.  Endo/Heme/Allergies:  Does not bruise/bleed easily.  Psychiatric/Behavioral:   Negative for hallucinations.     Performance status (ECOG): 1  Vitals Blood pressure 136/70, pulse (!) 57, temperature (!) 96 F (35.6 C), temperature source Tympanic, resp. rate 18, weight 165 lb (74.8 kg), SpO2 99%.  Physical Exam Constitutional:      General: She is not in acute distress.    Appearance: She is not diaphoretic.  HENT:     Head: Normocephalic and atraumatic.  Eyes:     General: No scleral icterus. Cardiovascular:     Rate and Rhythm: Normal rate.     Heart sounds: Murmur heard.  Pulmonary:     Effort: Pulmonary effort is normal. No respiratory distress.     Breath sounds: Normal breath sounds. No wheezing.  Abdominal:     General: There is no distension.  Musculoskeletal:        General: Normal range of motion.     Cervical back: Normal range of motion and neck supple.     Comments: Bilateral SI joint tenderness.   Skin:    General: Skin is warm and dry.     Findings: No erythema.  Neurological:     Mental Status: She is alert and oriented to person, place, and time. Mental status is at baseline.     Motor: No abnormal muscle tone.  Psychiatric:        Mood and Affect: Mood and affect normal.      Laboratory data    Latest Ref Rng & Units 06/23/2024    8:55 AM 06/16/2024   11:04 AM 05/12/2024    1:25 PM  CBC  WBC 4.0 - 10.5 K/uL 7.8  7.5  5.4   Hemoglobin 12.0 - 15.0 g/dL 88.4  88.7  88.1   Hematocrit 36.0 - 46.0 % 34.4  33.8  34.9   Platelets 150 - 400 K/uL 92  102  103       Latest Ref Rng & Units 06/23/2024    8:55 AM 06/16/2024   11:04 AM 05/12/2024    1:25 PM  CMP  Glucose 70 - 99 mg/dL 899  823  883   BUN 8 - 23 mg/dL 35  29  29   Creatinine 0.44 - 1.00 mg/dL 8.48  8.42  8.63   Sodium 135 - 145 mmol/L 140  141  136   Potassium 3.5 - 5.1 mmol/L 4.0  4.4  4.5   Chloride 98 - 111 mmol/L 105  107  104   CO2 22 - 32 mmol/L 21  20  22    Calcium  8.9 - 10.3 mg/dL 8.8  9.3  9.3   Total Protein 6.5 - 8.1 g/dL 7.0  7.0  7.5   Total  Bilirubin 0.0 - 1.2 mg/dL 0.3  <9.7  0.4   Alkaline Phos 38 - 126 U/L 104  112  87   AST 15 - 41 U/L 32  29  48   ALT 0 - 44 U/L 41  38  50      "

## 2024-06-25 LAB — KAPPA/LAMBDA LIGHT CHAINS
Kappa free light chain: 20.4 mg/L — ABNORMAL HIGH (ref 3.3–19.4)
Kappa, lambda light chain ratio: 0.53 (ref 0.26–1.65)
Lambda free light chains: 38.3 mg/L — ABNORMAL HIGH (ref 5.7–26.3)

## 2024-06-29 ENCOUNTER — Encounter: Payer: Self-pay | Admitting: Oncology

## 2024-06-29 LAB — MULTIPLE MYELOMA PANEL, SERUM
Albumin SerPl Elph-Mcnc: 3.8 g/dL (ref 2.9–4.4)
Albumin/Glob SerPl: 1.4 (ref 0.7–1.7)
Alpha 1: 0.2 g/dL (ref 0.0–0.4)
Alpha2 Glob SerPl Elph-Mcnc: 0.9 g/dL (ref 0.4–1.0)
B-Globulin SerPl Elph-Mcnc: 1.1 g/dL (ref 0.7–1.3)
Gamma Glob SerPl Elph-Mcnc: 0.7 g/dL (ref 0.4–1.8)
Globulin, Total: 2.8 g/dL (ref 2.2–3.9)
IgA: 290 mg/dL (ref 87–352)
IgG (Immunoglobin G), Serum: 772 mg/dL (ref 586–1602)
IgM (Immunoglobulin M), Srm: 64 mg/dL (ref 26–217)
Total Protein ELP: 6.6 g/dL (ref 6.0–8.5)

## 2024-06-29 MED FILL — Promethazine HCl Inj 25 MG/ML: INTRAMUSCULAR | Qty: 0.5 | Status: AC

## 2024-06-30 ENCOUNTER — Inpatient Hospital Stay

## 2024-07-02 ENCOUNTER — Encounter: Payer: Self-pay | Admitting: Internal Medicine

## 2024-07-02 ENCOUNTER — Encounter: Payer: Self-pay | Admitting: Oncology

## 2024-07-05 ENCOUNTER — Inpatient Hospital Stay

## 2024-07-05 ENCOUNTER — Inpatient Hospital Stay: Attending: Oncology

## 2024-07-05 VITALS — BP 125/60 | HR 61 | Temp 96.3°F | Resp 16

## 2024-07-05 DIAGNOSIS — E1122 Type 2 diabetes mellitus with diabetic chronic kidney disease: Secondary | ICD-10-CM | POA: Insufficient documentation

## 2024-07-05 DIAGNOSIS — I252 Old myocardial infarction: Secondary | ICD-10-CM | POA: Insufficient documentation

## 2024-07-05 DIAGNOSIS — C9001 Multiple myeloma in remission: Secondary | ICD-10-CM

## 2024-07-05 DIAGNOSIS — I5032 Chronic diastolic (congestive) heart failure: Secondary | ICD-10-CM | POA: Insufficient documentation

## 2024-07-05 DIAGNOSIS — I130 Hypertensive heart and chronic kidney disease with heart failure and stage 1 through stage 4 chronic kidney disease, or unspecified chronic kidney disease: Secondary | ICD-10-CM | POA: Insufficient documentation

## 2024-07-05 DIAGNOSIS — Z9049 Acquired absence of other specified parts of digestive tract: Secondary | ICD-10-CM | POA: Insufficient documentation

## 2024-07-05 DIAGNOSIS — Z888 Allergy status to other drugs, medicaments and biological substances status: Secondary | ICD-10-CM | POA: Insufficient documentation

## 2024-07-05 DIAGNOSIS — Z886 Allergy status to analgesic agent status: Secondary | ICD-10-CM | POA: Insufficient documentation

## 2024-07-05 DIAGNOSIS — R0989 Other specified symptoms and signs involving the circulatory and respiratory systems: Secondary | ICD-10-CM | POA: Insufficient documentation

## 2024-07-05 DIAGNOSIS — J069 Acute upper respiratory infection, unspecified: Secondary | ICD-10-CM | POA: Insufficient documentation

## 2024-07-05 DIAGNOSIS — Z9221 Personal history of antineoplastic chemotherapy: Secondary | ICD-10-CM | POA: Insufficient documentation

## 2024-07-05 DIAGNOSIS — Z90711 Acquired absence of uterus with remaining cervical stump: Secondary | ICD-10-CM | POA: Insufficient documentation

## 2024-07-05 DIAGNOSIS — N184 Chronic kidney disease, stage 4 (severe): Secondary | ICD-10-CM | POA: Insufficient documentation

## 2024-07-05 DIAGNOSIS — Z952 Presence of prosthetic heart valve: Secondary | ICD-10-CM | POA: Insufficient documentation

## 2024-07-05 DIAGNOSIS — J029 Acute pharyngitis, unspecified: Secondary | ICD-10-CM | POA: Insufficient documentation

## 2024-07-05 DIAGNOSIS — Z85038 Personal history of other malignant neoplasm of large intestine: Secondary | ICD-10-CM | POA: Insufficient documentation

## 2024-07-05 DIAGNOSIS — Z885 Allergy status to narcotic agent status: Secondary | ICD-10-CM | POA: Insufficient documentation

## 2024-07-05 DIAGNOSIS — R0981 Nasal congestion: Secondary | ICD-10-CM | POA: Insufficient documentation

## 2024-07-05 DIAGNOSIS — Z86018 Personal history of other benign neoplasm: Secondary | ICD-10-CM | POA: Insufficient documentation

## 2024-07-05 DIAGNOSIS — I429 Cardiomyopathy, unspecified: Secondary | ICD-10-CM | POA: Insufficient documentation

## 2024-07-05 DIAGNOSIS — Z79899 Other long term (current) drug therapy: Secondary | ICD-10-CM | POA: Insufficient documentation

## 2024-07-05 DIAGNOSIS — F32A Depression, unspecified: Secondary | ICD-10-CM | POA: Insufficient documentation

## 2024-07-05 LAB — MAGNESIUM: Magnesium: 1.3 mg/dL — ABNORMAL LOW (ref 1.7–2.4)

## 2024-07-05 MED ORDER — SODIUM CHLORIDE 0.9 % IV SOLN
12.5000 mg | Freq: Once | INTRAVENOUS | Status: AC
  Administered 2024-07-05: 12.5 mg via INTRAVENOUS
  Filled 2024-07-05: qty 0.5

## 2024-07-05 MED ORDER — MAGNESIUM SULFATE 2 GM/50ML IV SOLN
2.0000 g | Freq: Once | INTRAVENOUS | Status: AC
  Administered 2024-07-05: 2 g via INTRAVENOUS
  Filled 2024-07-05: qty 50

## 2024-07-05 MED ORDER — MAGNESIUM SULFATE 4 GM/100ML IV SOLN
4.0000 g | Freq: Once | INTRAVENOUS | Status: AC
  Administered 2024-07-05: 4 g via INTRAVENOUS
  Filled 2024-07-05: qty 100

## 2024-07-05 MED ORDER — SODIUM CHLORIDE 0.9 % IV SOLN
INTRAVENOUS | Status: DC
  Filled 2024-07-05: qty 250

## 2024-07-07 ENCOUNTER — Inpatient Hospital Stay

## 2024-07-14 ENCOUNTER — Encounter: Payer: Self-pay | Admitting: Oncology

## 2024-07-14 ENCOUNTER — Encounter: Payer: Self-pay | Admitting: Internal Medicine

## 2024-07-14 ENCOUNTER — Inpatient Hospital Stay

## 2024-07-14 ENCOUNTER — Telehealth: Payer: Self-pay

## 2024-07-14 ENCOUNTER — Other Ambulatory Visit: Payer: Self-pay

## 2024-07-14 VITALS — BP 123/59 | HR 56 | Temp 98.5°F | Resp 16

## 2024-07-14 DIAGNOSIS — H9203 Otalgia, bilateral: Secondary | ICD-10-CM

## 2024-07-14 DIAGNOSIS — R0981 Nasal congestion: Secondary | ICD-10-CM

## 2024-07-14 DIAGNOSIS — R059 Cough, unspecified: Secondary | ICD-10-CM

## 2024-07-14 DIAGNOSIS — C9001 Multiple myeloma in remission: Secondary | ICD-10-CM

## 2024-07-14 DIAGNOSIS — J069 Acute upper respiratory infection, unspecified: Secondary | ICD-10-CM

## 2024-07-14 LAB — MAGNESIUM: Magnesium: 1.5 mg/dL — ABNORMAL LOW (ref 1.7–2.4)

## 2024-07-14 MED ORDER — SODIUM CHLORIDE 0.9 % IV SOLN
12.5000 mg | Freq: Once | INTRAVENOUS | Status: AC
  Administered 2024-07-14: 12.5 mg via INTRAVENOUS
  Filled 2024-07-14: qty 0.5

## 2024-07-14 MED ORDER — SODIUM CHLORIDE 0.9 % IV SOLN
INTRAVENOUS | Status: DC
  Filled 2024-07-14: qty 250

## 2024-07-14 MED ORDER — MAGNESIUM SULFATE 4 GM/100ML IV SOLN
4.0000 g | Freq: Once | INTRAVENOUS | Status: AC
  Administered 2024-07-14: 4 g via INTRAVENOUS
  Filled 2024-07-14: qty 100

## 2024-07-14 NOTE — Progress Notes (Signed)
 Ok for [employer] and iv phenergan  per Olam Pinal 07/14/24

## 2024-07-14 NOTE — Progress Notes (Signed)
 Pt complaining of sinus drainage and pressure, cough, ear ache in left ear, and headache. Had a low grade fever 2 days ago. Pt concerned this is the 3rd time in 3 months. Niece had influenza A end of December. Notified Dr. Layvonne nurse Almarie Nett.

## 2024-07-14 NOTE — Telephone Encounter (Signed)
 Pt in fluid clinic today. Pt c/o frequent URI infections, she has had 3 since Sept. Dr. Babara recommends for pt to see ENT for further eval. Referral was sent to [facility] in Dec, but pt would like referral send to [facility]. She was notified that [facility] is [place], but she would like referral somewhere else [place]. Referral placed to [facility] specialist in [place].

## 2024-07-21 ENCOUNTER — Telehealth: Payer: Self-pay

## 2024-07-21 ENCOUNTER — Inpatient Hospital Stay: Admitting: Hospice and Palliative Medicine

## 2024-07-21 ENCOUNTER — Inpatient Hospital Stay

## 2024-07-21 VITALS — BP 122/65 | HR 64 | Temp 98.4°F | Resp 16

## 2024-07-21 DIAGNOSIS — C9001 Multiple myeloma in remission: Secondary | ICD-10-CM | POA: Diagnosis not present

## 2024-07-21 DIAGNOSIS — J069 Acute upper respiratory infection, unspecified: Secondary | ICD-10-CM | POA: Diagnosis not present

## 2024-07-21 LAB — RESPIRATORY PANEL BY PCR
Adenovirus: NOT DETECTED
Bordetella Parapertussis: NOT DETECTED
Bordetella pertussis: NOT DETECTED
Chlamydophila pneumoniae: NOT DETECTED
Coronavirus 229E: NOT DETECTED
Coronavirus HKU1: DETECTED — AB
Coronavirus NL63: NOT DETECTED
Coronavirus OC43: NOT DETECTED
Influenza A: NOT DETECTED
Influenza B: NOT DETECTED
Metapneumovirus: NOT DETECTED
Mycoplasma pneumoniae: NOT DETECTED
Parainfluenza Virus 1: NOT DETECTED
Parainfluenza Virus 2: NOT DETECTED
Parainfluenza Virus 3: NOT DETECTED
Parainfluenza Virus 4: NOT DETECTED
Respiratory Syncytial Virus: NOT DETECTED
Rhinovirus / Enterovirus: NOT DETECTED

## 2024-07-21 LAB — MAGNESIUM: Magnesium: 1.2 mg/dL — ABNORMAL LOW (ref 1.7–2.4)

## 2024-07-21 MED ORDER — MAGNESIUM SULFATE 4 GM/100ML IV SOLN
4.0000 g | Freq: Once | INTRAVENOUS | Status: AC
  Administered 2024-07-21: 4 g via INTRAVENOUS
  Filled 2024-07-21: qty 100

## 2024-07-21 MED ORDER — PROMETHAZINE-DM 6.25-15 MG/5ML PO SYRP: 5.0000 mL | ORAL_SOLUTION | Freq: Four times a day (QID) | ORAL | 0 refills | Status: DC | PRN

## 2024-07-21 MED ORDER — DOXYCYCLINE HYCLATE 100 MG PO TABS: 100.0000 mg | ORAL_TABLET | Freq: Two times a day (BID) | ORAL | 0 refills | Status: AC

## 2024-07-21 MED ORDER — SODIUM CHLORIDE 0.9 % IV SOLN
12.5000 mg | Freq: Once | INTRAVENOUS | Status: AC
  Administered 2024-07-21: 12.5 mg via INTRAVENOUS
  Filled 2024-07-21: qty 0.5

## 2024-07-21 MED ORDER — SODIUM CHLORIDE 0.9 % IV SOLN
INTRAVENOUS | Status: DC
  Filled 2024-07-21: qty 250

## 2024-07-21 MED ORDER — MAGNESIUM SULFATE 2 GM/50ML IV SOLN
2.0000 g | Freq: Once | INTRAVENOUS | Status: AC
  Administered 2024-07-21: 2 g via INTRAVENOUS
  Filled 2024-07-21: qty 50

## 2024-07-21 NOTE — Progress Notes (Signed)
 Pt continues to respiratory symptoms of sinus drainage, nonproductive cough, sore throat, loss of taste, and fatigued. Notified Dr. Babara and [facility] clinic NP, Morna Husband and [facility]. Sidra Mower, NP will see pt today.

## 2024-07-21 NOTE — Patient Instructions (Signed)
 Magnesium Sulfate Injection What is this medication? MAGNESIUM SULFATE (mag NEE zee um SUL fate) prevents and treats low levels of magnesium in your body. It may also be used to prevent and treat seizures during pregnancy in people with high blood pressure disorders, such as preeclampsia or eclampsia. Magnesium plays an important role in maintaining the health of your muscles and nervous system. This medicine may be used for other purposes; ask your health care provider or pharmacist if you have questions. What should I tell my care team before I take this medication? They need to know if you have any of these conditions: Heart disease History of irregular heart beat Kidney disease An unusual or allergic reaction to magnesium sulfate, medications, foods, dyes, or preservatives Pregnant or trying to get pregnant Breast-feeding How should I use this medication? This medication is for infusion into a vein. It is given in a hospital or clinic setting. Talk to your care team about the use of this medication in children. While this medication may be prescribed for selected conditions, precautions do apply. Overdosage: If you think you have taken too much of this medicine contact a poison control center or emergency room at once. NOTE: This medicine is only for you. Do not share this medicine with others. What if I miss a dose? This does not apply. What may interact with this medication? Certain medications for anxiety or sleep Certain medications for seizures, such phenobarbital Digoxin Medications that relax muscles for surgery Narcotic medications for pain This list may not describe all possible interactions. Give your health care provider a list of all the medicines, herbs, non-prescription drugs, or dietary supplements you use. Also tell them if you smoke, drink alcohol, or use illegal drugs. Some items may interact with your medicine. What should I watch for while using this  medication? Your condition will be monitored carefully while you are receiving this medication. You may need blood work done while you are receiving this medication. What side effects may I notice from receiving this medication? Side effects that you should report to your care team as soon as possible: Allergic reactions--skin rash, itching, hives, swelling of the face, lips, tongue, or throat High magnesium level--confusion, drowsiness, facial flushing, redness, sweating, muscle weakness, fast or irregular heartbeat, trouble breathing Low blood pressure--dizziness, feeling faint or lightheaded, blurry vision Side effects that usually do not require medical attention (report to your care team if they continue or are bothersome): Headache Nausea This list may not describe all possible side effects. Call your doctor for medical advice about side effects. You may report side effects to FDA at 1-800-FDA-1088. Where should I keep my medication? This medication is given in a hospital or clinic and will not be stored at home. NOTE: This sheet is a summary. It may not cover all possible information. If you have questions about this medicine, talk to your doctor, pharmacist, or health care provider.  2024 Elsevier/Gold Standard (2021-02-28 00:00:00)

## 2024-07-21 NOTE — Telephone Encounter (Signed)
 Patient informed of positive COVID test and per Fonda Borders the pan will remain the same. Advised patient to get rest and stay hydrated and to contact us  of needed. Patient verbalized understanding.

## 2024-07-21 NOTE — Progress Notes (Signed)
 "  Symptom Management Clinic [facility] Telephone:([phone] [phone]  Patient Care Team: Justus Leita DEL, MD as PCP - General (Internal Medicine) Babara Call, MD as Consulting Physician (Oncology) Berneda Lonni MATSU, MD as Referring Physician (Ophthalmology) Barnet Donnice Righter, MD as Referring Physician (Cardiology) Beschel Chropractor Marcelino Gales, MD (Nephrology) Therisa Bi, MD as Consulting Physician (Gastroenterology)   NAME OF PATIENT: Sarah [name]  [id number]  [birth year]   DATE OF VISIT: 07/21/24  REASON FOR CONSULT: Alexandr Oehler is a 69 y.o. female with multiple medical problems including multiple myeloma in remission status post bone marrow transplant on 05/10/2020.  Patient off Dara due to frequent infections.  INTERVAL HISTORY: Patient was here for fluids.  Was an add-on to [facility] for evaluation of upper respiratory symptoms including runny nose, nonproductive cough, congestion, and sore throat over the past week.  Patient states that she was exposed to flu earlier in the month.  Also has a sick sister.  Patient states that she is taking guaifenesin , Singulair , and loratadine .  Denies any neurologic complaints. Denies any easy bleeding or bruising. Reports good appetite and denies weight loss. Denies chest pain. Denies urinary complaints. Patient offers no further specific complaints today.   PAST MEDICAL HISTORY: Past Medical History:  Diagnosis Date   Amaurosis fugax of left eye 10/24/2023   Anemia    Anxiety    Aortic regurgitation 04/19/2016   Aortic stenosis    a. 05/2020 Echo: EF 45-50%, sev AS - seen by TAVR team @ [facility] - CTA sugg of tricuspid valve w/ fusing of 2 leaflets-TAVR deferred in setting of acute infxn; b. 07/2020 Echo: EF 50-55%, mod-sev AS; c. 12/2020 Echo: EF>55%. Mod-sev paradoxical low-flow low-gradient AS; d. 08/2021 Echo: EF 35-40%, severe AS, triv AI, mild MR.   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 Variable EF over time; b. 08/2017 Echo: EF 40%; b. 03/2020 Echo: EF 55-60%; c. 05/2020 Echo: EF 45-50%; d. 07/2020 Echo: EF 50-55%; e. 12/2020 Echo: EF>55%; f. 08/2021 Echo: EF 35-40%.   CHF (congestive heart failure) (HCC)    Chronic heart failure with preserved ejection fraction (HFpEF) (HCC)    a. Variable EF over time; b. 08/2017 Echo: EF 40%; b. 03/2020 Echo: EF 55-60%; c. 05/2020 Echo: EF 45-50%; d. 07/2020 Echo: EF 50-55%; e. 12/2020 Echo: EF>55%; f. 08/2021 Echo: EF 35-40%, no RV, mildly dil LA, mild MR, triv AI, severe AS.   Chronic pain syndrome 07/08/2016   CKD (chronic kidney disease) stage 3, GFR 30-59 ml/min (HCC)    Colon cancer (HCC)    Dad   Depression    Diabetes mellitus (HCC)    Dizziness    Fatty liver    Frequent falls    GERD (gastroesophageal reflux disease)    Gout    Heart murmur    History of blood transfusion    History of bone marrow transplant (HCC)    History of uterine fibroid    Hx of cardiac catheterization    a. 01/2016 Cath ([place]  - after abnl nuc): Nl cors.   Hypertension    Hypomagnesemia    IDA (iron  deficiency anemia)    Multiple myeloma (HCC)    Nonrheumatic aortic valve stenosis 06/05/2016   NSTEMI (non-ST elevated myocardial infarction) (HCC) 08/21/2021  Personal history of chemotherapy    Pleurisy 04/09/2022   PSVT (paroxysmal supraventricular tachycardia)    a. S/p RFCA 1999 ([place] ).   PVC's (premature ventricular contractions)    a. Well-managed w/ bisoprolol  in outpt setting.   Renal cyst    Ulcer 2008   3 ulcers    PAST SURGICAL HISTORY:  Past Surgical History:  Procedure Laterality Date   ABDOMINAL HYSTERECTOMY  1997   Auto Stem Cell transplant  06/2015   CARDIAC ELECTROPHYSIOLOGY MAPPING AND ABLATION     CARPAL TUNNEL RELEASE Bilateral     CHOLECYSTECTOMY  2008   COLONOSCOPY WITH PROPOFOL  N/A 05/07/2017   Procedure: COLONOSCOPY WITH PROPOFOL ;  Surgeon: Therisa Bi, MD;  Location: [facility] ENDOSCOPY;  Service: Gastroenterology;  Laterality: N/A;   COLONOSCOPY WITH PROPOFOL  N/A 12/19/2020   Procedure: COLONOSCOPY WITH PROPOFOL ;  Surgeon: Therisa Bi, MD;  Location: [facility] ENDOSCOPY;  Service: Gastroenterology;  Laterality: N/A;   ESOPHAGOGASTRODUODENOSCOPY (EGD) WITH PROPOFOL  N/A 05/07/2017   Procedure: ESOPHAGOGASTRODUODENOSCOPY (EGD) WITH PROPOFOL ;  Surgeon: Therisa Bi, MD;  Location: [facility] ENDOSCOPY;  Service: Gastroenterology;  Laterality: N/A;   FOOT SURGERY Bilateral    INCONTINENCE SURGERY  2009   INTERSTIM IMPLANT PLACEMENT     other     over active bladder   OTHER SURGICAL HISTORY     bladder stimulator    PARTIAL HYSTERECTOMY  03/1996   fibroids   PORTA CATH INSERTION N/A 03/10/2019   Procedure: PORTA CATH INSERTION;  Surgeon: Marea Selinda RAMAN, MD;  Location: [facility] INVASIVE CV LAB;  Service: Cardiovascular;  Laterality: N/A;   TONSILLECTOMY  2007   TRANSCATHETER AORTIC VALVE REPLACEMENT, TRANSFEMORAL  11/06/2023   TUBAL LIGATION  1984    HEMATOLOGY/ONCOLOGY HISTORY:  Oncology History Overview Note  # June 2017- [facility] MULTIPLE MYELOMA [BMBx- 80% plasma cells; [facility]]; Lamda light chain 1340; s/p RVD   # 14th DEC 2016- Auto-Stem cell transplant [[facility]  [place] ;Dr.Hertzig]; rev [dose reduced sec to Diarrhea]  # Maintenance Revlimid - 5mg  3 w-ON & 1 week OFF; HELD July 2018- sec to ONJ  # Bone lesions-numerous ~39mm lytic lesions in Thoracic spine- on Zometa   # July-Aug 2018- OSTEONECROSIS OF JAW- DISCONTINUED ZOMETA   # June 2016- Proteinuria [3gm/day]/ CKD III   # chronic back pain/ anxiety/ PN  # 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  # Colitis s/p multiple EGD/Colonoscopies; EGD- patchy inflammation/colo- Nov 2018 [Dr.Anna]  DIAGNOSIS: Multiple myeloma  GOALS:  Control/palliative  CURRENT/MOST RECENT THERAPY -surveillance    Multiple myeloma in remission (HCC)  09/27/2019 - 04/10/2020 Chemotherapy         08/31/2020 - 05/14/2021 Chemotherapy   Patient is on Treatment Plan : MYELOMA MAINTENANCE Bortezomib  SQ q14d     05/30/2021 - 05/30/2021 Chemotherapy   Patient is on Treatment Plan : MYELOMA NEWLY DIAGNOSED TRANSPLANT CANDIDATE Daratumumab  SQ + Lenalidomide  q28d (Maintenance)     05/30/2021 -  Chemotherapy   Patient is on Treatment Plan : MYELOMA NEWLY DIAGNOSED TRANSPLANT CANDIDATE Daratumumab  SQ + Lenalidomide  q28d (Maintenance)     06/06/2021 - 10/29/2021 Chemotherapy   Patient is on Treatment Plan : MYELOMA POST  TRANSPLANT Daratumumab  SQ (Maintenance)     Multiple myeloma in remission (HCC)  05/14/2021 Initial Diagnosis   Multiple myeloma in remission (HCC)   05/30/2021 - 05/30/2021 Chemotherapy   Patient is on Treatment Plan : MYELOMA NEWLY DIAGNOSED TRANSPLANT CANDIDATE Daratumumab  SQ + Lenalidomide  q28d (Maintenance)     05/30/2021 -  Chemotherapy   Patient  is on Treatment Plan : MYELOMA NEWLY DIAGNOSED TRANSPLANT CANDIDATE Daratumumab  SQ + Lenalidomide  q28d (Maintenance)     06/06/2021 - 10/29/2021 Chemotherapy   Patient is on Treatment Plan : MYELOMA POST  TRANSPLANT Daratumumab  SQ (Maintenance)       ALLERGIES:  is allergic to other, oxycodone-acetaminophen , celebrex  [celecoxib ], codeine , plerixafor, benadryl  [diphenhydramine ], morphine , ondansetron , and tylenol  [acetaminophen ].  MEDICATIONS:  Current Outpatient Medications  Medication Sig Dispense Refill   albuterol  (VENTOLIN  HFA) 108 (90 Base) MCG/ACT inhaler Inhale 2 puffs into the lungs every 6 (six) hours as needed for wheezing or shortness of breath. 17 each 0   allopurinol  (ZYLOPRIM ) 100 MG tablet TAKE 1 TABLET(100 MG) BY MOUTH DAILY as needed 90 tablet 1   benzonatate  (TESSALON ) 100 MG capsule Take 1 capsule (100 mg total) by mouth 3 (three) times daily as needed for  cough. 20 capsule 0   bisoprolol  (ZEBETA ) 10 MG tablet Take 1 tablet (10 mg total) by mouth daily. 90 tablet 1   Blood Glucose Monitoring Suppl DEVI 1 each by Does not apply route as directed. Dispense based on patient and insurance preference. Use up to four times daily as directed. (FOR ICD-10 E10.9, E11.9). 1 each 0   cholestyramine  (QUESTRAN ) 4 GM/DOSE powder Take 1 packet (4 g total) by mouth 2 (two) times daily with a meal. 240 packet 2   clotrimazole  (GYNE-LOTRIMIN  3) 2 % vaginal cream Place 1 Applicatorful vaginally at bedtime. 21 g 0   diazepam  (VALIUM ) 5 MG tablet TAKE 1/2 TO 1 TABLET(2.5 TO 5 MG) BY MOUTH AT BEDTIME AS NEEDED FOR INSOMNIA 30 tablet 5   diclofenac  sodium (VOLTAREN ) 1 % GEL Apply 2 g topically 4 (four) times daily. 100 g 1   diphenoxylate -atropine  (LOMOTIL ) 2.5-0.025 MG tablet Take 2 tablets by mouth 4 (four) times daily as needed for diarrhea or loose stools. 180 tablet 3   ENTRESTO  24-26 MG Take 1 tablet by mouth 2 (two) times daily.     FLUoxetine  (PROZAC ) 40 MG capsule Take 1 capsule (40 mg total) by mouth daily. 90 capsule 1   fluticasone  (FLONASE ) 50 MCG/ACT nasal spray Place 2 sprays into both nostrils daily.     furosemide  (LASIX ) 40 MG tablet Take 0.5 tablets (20 mg total) by mouth daily. Home dose.     glipiZIDE  (GLUCOTROL  XL) 2.5 MG 24 hr tablet TAKE 1 TABLET(2.5 MG) BY MOUTH DAILY WITH BREAKFAST 90 tablet 1   Glucose Blood (BLOOD GLUCOSE TEST STRIPS) STRP 1 each by Does not apply route as directed. Dispense based on patient and insurance preference. Use up to four times daily as directed. (FOR ICD-10 E10.9, E11.9). 100 strip 0   JARDIANCE 10 MG TABS tablet Take 10 mg by mouth daily.     Lancet Device MISC 1 each by Does not apply route as directed. Dispense based on patient and insurance preference. Use up to four times daily as directed. (FOR ICD-10 E10.9, E11.9). 1 each 0   Lancets MISC 1 each by Does not apply route as directed. Dispense based on patient and  insurance preference. Use up to four times daily as directed. (FOR ICD-10 E10.9, E11.9). 100 each 0   lidocaine  (XYLOCAINE ) 2 % solution Use as directed 15 mLs in the mouth or throat every 3 (three) hours as needed for mouth pain (swish and spit). 100 mL 0   metaxalone  (SKELAXIN ) 800 MG tablet Take 1 tablet (800 mg total) by mouth in the morning. 30 tablet 0   Multiple Vitamin (  MULTIVITAMIN) tablet Take 1 tablet by mouth daily.     nystatin  (MYCOSTATIN ) 100000 UNIT/ML suspension Take by mouth.     omeprazole  (PRILOSEC) 40 MG capsule TAKE 1 CAPSULE IN THE MORNING AND TAKE 1 CAPSULE AT BEDTIME 180 capsule 3   potassium chloride  SA (KLOR-CON  M) 20 MEQ tablet Take 1 tablet (20 mEq total) by mouth daily. 7 tablet 0   predniSONE  (DELTASONE ) 20 MG tablet Take 2 tablets (40 mg) daily for 3 days 6 tablet 0   rosuvastatin  (CRESTOR ) 5 MG tablet TAKE 1 TABLET(5 MG) BY MOUTH DAILY 90 tablet 0   spironolactone (ALDACTONE) 25 MG tablet Take by mouth.     tiZANidine  (ZANAFLEX ) 4 MG tablet Take 1 tablet (4 mg total) by mouth at bedtime. 90 tablet 0   traZODone  (DESYREL ) 100 MG tablet TAKE 1 TABLET AT BEDTIME 90 tablet 3   VITAMIN D , CHOLECALCIFEROL, PO Take 2,000 Units by mouth daily.     No current facility-administered medications for this visit.   Facility-Administered Medications Ordered in Other Visits  Medication Dose Route Frequency Provider Last Rate Last Admin   0.9 %  sodium chloride  infusion   Intravenous Continuous Babara Call, MD 10 mL/hr at 07/21/24 0914 New Bag at 07/21/24 0914   magnesium  sulfate IVPB 2 g 50 mL  2 g Intravenous Once Babara Call, MD       magnesium  sulfate IVPB 4 g 100 mL  4 g Intravenous Once Babara Call, MD       sodium chloride  flush (NS) 0.9 % injection 10 mL  10 mL Intravenous PRN Babara Call, MD   10 mL at 04/02/21 0904   sodium chloride  flush (NS) 0.9 % injection 10 mL  10 mL Intracatheter Once PRN Babara Call, MD        VITAL SIGNS: There were no vitals taken for this visit. There  were no vitals filed for this visit.  Estimated body mass index is 29.7 kg/m as calculated from the following:   Height as of 05/25/24: 5' 2.5 (1.588 m).   Weight as of 06/23/24: 165 lb (74.8 kg).  LABS: CBC:    Component Value Date/Time   WBC 7.8 06/23/2024 0855   WBC 7.0 09/11/2022 1006   HGB 11.5 (L) 06/23/2024 0855   HCT 34.4 (L) 06/23/2024 0855   PLT 92 (L) 06/23/2024 0855   MCV 84.1 06/23/2024 0855   NEUTROABS 5.3 06/23/2024 0855   LYMPHSABS 1.8 06/23/2024 0855   MONOABS 0.5 06/23/2024 0855   EOSABS 0.1 06/23/2024 0855   BASOSABS 0.0 06/23/2024 0855   Comprehensive Metabolic Panel:    Component Value Date/Time   NA 140 06/23/2024 0855   NA 141 04/19/2016 1604   K 4.0 06/23/2024 0855   CL 105 06/23/2024 0855   CO2 21 (L) 06/23/2024 0855   BUN 35 (H) 06/23/2024 0855   BUN 22 04/19/2016 1604   CREATININE 1.51 (H) 06/23/2024 0855   GLUCOSE 100 (H) 06/23/2024 0855   CALCIUM  8.8 (L) 06/23/2024 0855   CALCIUM  8.8 03/23/2020 1144   AST 32 06/23/2024 0855   ALT 41 06/23/2024 0855   ALKPHOS 104 06/23/2024 0855   BILITOT 0.3 06/23/2024 0855   PROT 7.0 06/23/2024 0855   ALBUMIN  4.4 06/23/2024 0855    RADIOGRAPHIC STUDIES: No results found.   PERFORMANCE STATUS (ECOG) : 1 - Symptomatic but completely ambulatory  Review of Systems Unless otherwise noted, a complete review of systems is negative.  Physical Exam General: NAD HEENT: no exudate,  nasal congestion, no lymphadenopathy Cardiovascular: regular rate and rhythm Pulmonary: Cough, clear anterior/posterior fields Abdomen: soft, nontender, + bowel sounds GU: no suprapubic tenderness Extremities: no edema, no joint deformities Skin: no rashes Neurological: Weakness but otherwise nonfocal  IMPRESSION/PLAN: Multiple myeloma -on surveillance with ongoing supportive care  URI -will check viral PCR panel.  Patient has history of recurrent infections, so we will empirically cover her with doxycycline  100 mg  twice daily x 7 days.  Promethazine  DM for cough.  Weekly fluids.  MD follow-up in 2 weeks.  Patient can be seen sooner if needed.  Patient expressed understanding and was in agreement with this plan. She also understands that She can call clinic at any time with any questions, concerns, or complaints.   Thank you for allowing me to participate in the care of this very pleasant patient.   Time Total: 15 minutes  Visit consisted of counseling and education dealing with the complex and emotionally intense issues of symptom management in the setting of serious illness.Greater than 50%  of this time was spent counseling and coordinating care related to the above assessment and plan.  Signed by: Fonda Mower, PhD, NP-C     "

## 2024-07-27 ENCOUNTER — Encounter: Admitting: Dietician

## 2024-07-28 ENCOUNTER — Inpatient Hospital Stay

## 2024-07-28 ENCOUNTER — Other Ambulatory Visit: Payer: Self-pay

## 2024-07-28 ENCOUNTER — Encounter: Payer: Self-pay | Admitting: Oncology

## 2024-07-28 ENCOUNTER — Other Ambulatory Visit: Payer: Self-pay | Admitting: Oncology

## 2024-07-28 VITALS — BP 121/66 | HR 62 | Temp 97.3°F | Resp 16

## 2024-07-28 DIAGNOSIS — C9001 Multiple myeloma in remission: Secondary | ICD-10-CM

## 2024-07-28 DIAGNOSIS — R110 Nausea: Secondary | ICD-10-CM

## 2024-07-28 LAB — MAGNESIUM: Magnesium: 1.3 mg/dL — ABNORMAL LOW (ref 1.7–2.4)

## 2024-07-28 MED ORDER — MAGNESIUM SULFATE 2 GM/50ML IV SOLN
2.0000 g | Freq: Once | INTRAVENOUS | Status: AC
  Administered 2024-07-28: 2 g via INTRAVENOUS
  Filled 2024-07-28: qty 50

## 2024-07-28 MED ORDER — MAGNESIUM SULFATE 4 GM/100ML IV SOLN
4.0000 g | Freq: Once | INTRAVENOUS | Status: AC
  Administered 2024-07-28: 4 g via INTRAVENOUS
  Filled 2024-07-28: qty 100

## 2024-07-28 MED ORDER — SODIUM CHLORIDE 0.9 % IV SOLN
12.5000 mg | Freq: Once | INTRAVENOUS | Status: AC
  Administered 2024-07-28: 12.5 mg via INTRAVENOUS
  Filled 2024-07-28: qty 0.5

## 2024-07-28 MED ORDER — SODIUM CHLORIDE 0.9 % IV SOLN
INTRAVENOUS | Status: DC
  Filled 2024-07-28: qty 250

## 2024-07-30 ENCOUNTER — Encounter: Payer: Self-pay | Admitting: Internal Medicine

## 2024-07-30 ENCOUNTER — Encounter: Payer: Self-pay | Admitting: Oncology

## 2024-08-01 ENCOUNTER — Telehealth: Payer: Self-pay | Admitting: Oncology

## 2024-08-01 NOTE — Telephone Encounter (Signed)
 Due to weather, clinic on delayed start time 2/3. I called and spoke with pt and confirmed new start time

## 2024-08-03 ENCOUNTER — Inpatient Hospital Stay

## 2024-08-03 ENCOUNTER — Inpatient Hospital Stay: Attending: Oncology | Admitting: Oncology

## 2024-08-03 ENCOUNTER — Inpatient Hospital Stay: Payer: Self-pay

## 2024-08-03 ENCOUNTER — Inpatient Hospital Stay: Payer: Self-pay | Attending: Oncology

## 2024-08-03 ENCOUNTER — Encounter: Payer: Self-pay | Admitting: Oncology

## 2024-08-03 ENCOUNTER — Inpatient Hospital Stay: Payer: Self-pay | Admitting: Oncology

## 2024-08-03 DIAGNOSIS — C9001 Multiple myeloma in remission: Secondary | ICD-10-CM

## 2024-08-03 DIAGNOSIS — K529 Noninfective gastroenteritis and colitis, unspecified: Secondary | ICD-10-CM | POA: Diagnosis not present

## 2024-08-03 DIAGNOSIS — N184 Chronic kidney disease, stage 4 (severe): Secondary | ICD-10-CM | POA: Diagnosis not present

## 2024-08-03 DIAGNOSIS — R9431 Abnormal electrocardiogram [ECG] [EKG]: Secondary | ICD-10-CM

## 2024-08-03 DIAGNOSIS — E538 Deficiency of other specified B group vitamins: Secondary | ICD-10-CM | POA: Diagnosis not present

## 2024-08-03 DIAGNOSIS — D696 Thrombocytopenia, unspecified: Secondary | ICD-10-CM | POA: Diagnosis not present

## 2024-08-03 DIAGNOSIS — I5022 Chronic systolic (congestive) heart failure: Secondary | ICD-10-CM

## 2024-08-03 DIAGNOSIS — R197 Diarrhea, unspecified: Secondary | ICD-10-CM

## 2024-08-03 LAB — CBC WITH DIFFERENTIAL (CANCER CENTER ONLY)
Abs Immature Granulocytes: 0.03 10*3/uL (ref 0.00–0.07)
Basophils Absolute: 0 10*3/uL (ref 0.0–0.1)
Basophils Relative: 0 %
Eosinophils Absolute: 0.1 10*3/uL (ref 0.0–0.5)
Eosinophils Relative: 1 %
HCT: 36.8 % (ref 36.0–46.0)
Hemoglobin: 12 g/dL (ref 12.0–15.0)
Immature Granulocytes: 1 %
Lymphocytes Relative: 21 %
Lymphs Abs: 1.2 10*3/uL (ref 0.7–4.0)
MCH: 27.6 pg (ref 26.0–34.0)
MCHC: 32.6 g/dL (ref 30.0–36.0)
MCV: 84.6 fL (ref 80.0–100.0)
Monocytes Absolute: 0.4 10*3/uL (ref 0.1–1.0)
Monocytes Relative: 8 %
Neutro Abs: 4.1 10*3/uL (ref 1.7–7.7)
Neutrophils Relative %: 69 %
Platelet Count: 78 10*3/uL — ABNORMAL LOW (ref 150–400)
RBC: 4.35 MIL/uL (ref 3.87–5.11)
RDW: 14.1 % (ref 11.5–15.5)
WBC Count: 5.9 10*3/uL (ref 4.0–10.5)
nRBC: 0 % (ref 0.0–0.2)

## 2024-08-03 LAB — CMP (CANCER CENTER ONLY)
ALT: 34 U/L (ref 0–44)
AST: 30 U/L (ref 15–41)
Albumin: 4.4 g/dL (ref 3.5–5.0)
Alkaline Phosphatase: 98 U/L (ref 38–126)
Anion gap: 13 (ref 5–15)
BUN: 28 mg/dL — ABNORMAL HIGH (ref 8–23)
CO2: 20 mmol/L — ABNORMAL LOW (ref 22–32)
Calcium: 9.3 mg/dL (ref 8.9–10.3)
Chloride: 103 mmol/L (ref 98–111)
Creatinine: 1.54 mg/dL — ABNORMAL HIGH (ref 0.44–1.00)
GFR, Estimated: 37 mL/min — ABNORMAL LOW
Glucose, Bld: 155 mg/dL — ABNORMAL HIGH (ref 70–99)
Potassium: 4.2 mmol/L (ref 3.5–5.1)
Sodium: 136 mmol/L (ref 135–145)
Total Bilirubin: 0.4 mg/dL (ref 0.0–1.2)
Total Protein: 6.9 g/dL (ref 6.5–8.1)

## 2024-08-03 LAB — MAGNESIUM: Magnesium: 1.6 mg/dL — ABNORMAL LOW (ref 1.7–2.4)

## 2024-08-03 LAB — VITAMIN B12: Vitamin B-12: 525 pg/mL (ref 180–914)

## 2024-08-03 MED ORDER — CYANOCOBALAMIN 1000 MCG/ML IJ SOLN
1000.0000 ug | Freq: Once | INTRAMUSCULAR | Status: AC
  Administered 2024-08-03: 1000 ug via INTRAMUSCULAR
  Filled 2024-08-03: qty 1

## 2024-08-03 NOTE — Assessment & Plan Note (Signed)
 Previously seen by GI Dr. Therisa. She would like to get a 2nd opinion.  Refer to [facility].

## 2024-08-03 NOTE — Assessment & Plan Note (Signed)
-  avoid nephrotoxins

## 2024-08-03 NOTE — Patient Instructions (Signed)
 Vitamin B12 Injection What is this medication? Vitamin B12 (VAHY tuh min B12) prevents and treats low vitamin B12 levels in your body. It is used in people who do not get enough vitamin B12 from their diet or when their digestive tract does not absorb enough. Vitamin B12 plays an important role in maintaining the health of your nervous system and red blood cells. This medicine may be used for other purposes; ask your health care provider or pharmacist if you have questions. COMMON BRAND NAME(S): B-12 Compliance Kit, B-12 Injection Kit, Cyomin, Dodex , LA-12, Nutri-Twelve, Physicians EZ Use B-12, Primabalt, Vitamin Deficiency Injectable System - B12 What should I tell my care team before I take this medication? They need to know if you have any of these conditions: Kidney disease Leber's disease Megaloblastic anemia An unusual or allergic reaction to cyanocobalamin , cobalt, other medications, foods, dyes, or preservatives Pregnant or trying to get pregnant Breast-feeding How should I use this medication? This medication is injected into a muscle or deeply under the skin. It is usually given in a clinic or care team's office. However, your care team may teach you how to inject yourself. Follow all instructions. Talk to your care team about the use of this medication in children. Special care may be needed. Overdosage: If you think you have taken too much of this medicine contact a poison control center or emergency room at once. NOTE: This medicine is only for you. Do not share this medicine with others. What if I miss a dose? If you are given your dose at a clinic or care team's office, call to reschedule your appointment. If you give your own injections, and you miss a dose, take it as soon as you can. If it is almost time for your next dose, take only that dose. Do not take double or extra doses. What may interact with this medication? Alcohol Colchicine This list may not describe all possible  interactions. Give your health care provider a list of all the medicines, herbs, non-prescription drugs, or dietary supplements you use. Also tell them if you smoke, drink alcohol, or use illegal drugs. Some items may interact with your medicine. What should I watch for while using this medication? Visit your care team regularly. You may need blood work done while you are taking this medication. You may need to follow a special diet. Talk to your care team. Limit your alcohol intake and avoid smoking to get the best benefit. What side effects may I notice from receiving this medication? Side effects that you should report to your care team as soon as possible: Allergic reactions--skin rash, itching, hives, swelling of the face, lips, tongue, or throat Swelling of the ankles, hands, or feet Trouble breathing Side effects that usually do not require medical attention (report to your care team if they continue or are bothersome): Diarrhea This list may not describe all possible side effects. Call your doctor for medical advice about side effects. You may report side effects to FDA at 1-800-FDA-1088. Where should I keep my medication? Keep out of the reach of children. Store at room temperature between 15 and 30 degrees C (59 and 85 degrees F). Protect from light. Throw away any unused medication after the expiration date. NOTE: This sheet is a summary. It may not cover all possible information. If you have questions about this medicine, talk to your doctor, pharmacist, or health care provider.  2024 Elsevier/Gold Standard (2021-02-27 00:00:00)

## 2024-08-03 NOTE — Assessment & Plan Note (Signed)
 Follow up with [facility] cardiology.

## 2024-08-03 NOTE — Assessment & Plan Note (Signed)
 continue B12 injections Q 6 weeks.

## 2024-08-04 ENCOUNTER — Inpatient Hospital Stay

## 2024-08-04 ENCOUNTER — Inpatient Hospital Stay: Admitting: Oncology

## 2024-08-04 LAB — KAPPA/LAMBDA LIGHT CHAINS
Kappa free light chain: 21 mg/L — ABNORMAL HIGH (ref 3.3–19.4)
Kappa, lambda light chain ratio: 0.54 (ref 0.26–1.65)
Lambda free light chains: 38.9 mg/L — ABNORMAL HIGH (ref 5.7–26.3)

## 2024-08-05 LAB — MULTIPLE MYELOMA PANEL, SERUM
Albumin SerPl Elph-Mcnc: 3.8 g/dL (ref 2.9–4.4)
Albumin/Glob SerPl: 1.5 (ref 0.7–1.7)
Alpha 1: 0.2 g/dL (ref 0.0–0.4)
Alpha2 Glob SerPl Elph-Mcnc: 0.8 g/dL (ref 0.4–1.0)
B-Globulin SerPl Elph-Mcnc: 1 g/dL (ref 0.7–1.3)
Gamma Glob SerPl Elph-Mcnc: 0.7 g/dL (ref 0.4–1.8)
Globulin, Total: 2.7 g/dL (ref 2.2–3.9)
IgA: 279 mg/dL (ref 87–352)
IgG (Immunoglobin G), Serum: 771 mg/dL (ref 586–1602)
IgM (Immunoglobulin M), Srm: 52 mg/dL (ref 26–217)
Total Protein ELP: 6.5 g/dL (ref 6.0–8.5)

## 2024-08-06 ENCOUNTER — Encounter: Payer: Self-pay | Admitting: Oncology

## 2024-08-06 ENCOUNTER — Ambulatory Visit: Admitting: Podiatry

## 2024-08-06 ENCOUNTER — Other Ambulatory Visit: Payer: Self-pay

## 2024-08-11 ENCOUNTER — Institutional Professional Consult (permissible substitution) (INDEPENDENT_AMBULATORY_CARE_PROVIDER_SITE_OTHER): Admitting: Otolaryngology

## 2024-09-24 ENCOUNTER — Encounter: Admitting: Student

## 2024-10-06 ENCOUNTER — Inpatient Hospital Stay: Admitting: Oncology

## 2024-10-06 ENCOUNTER — Inpatient Hospital Stay

## 2024-10-26 ENCOUNTER — Encounter: Admitting: Student

## 2024-11-04 ENCOUNTER — Ambulatory Visit: Admitting: Podiatry

## 2025-03-30 ENCOUNTER — Ambulatory Visit
